# Patient Record
Sex: Female | Born: 1960 | Race: Black or African American | Hispanic: No | Marital: Single | Smoking: Former smoker
Health system: Southern US, Community
[De-identification: ages and names within clinical notes are randomized; demographics above are authoritative.]

## PROBLEM LIST (undated history)

## (undated) DIAGNOSIS — F32A Depression, unspecified: Secondary | ICD-10-CM

## (undated) DIAGNOSIS — J189 Pneumonia, unspecified organism: Secondary | ICD-10-CM

## (undated) DIAGNOSIS — M109 Gout, unspecified: Secondary | ICD-10-CM

## (undated) DIAGNOSIS — R519 Headache, unspecified: Secondary | ICD-10-CM

## (undated) DIAGNOSIS — F329 Major depressive disorder, single episode, unspecified: Secondary | ICD-10-CM

## (undated) DIAGNOSIS — I10 Essential (primary) hypertension: Secondary | ICD-10-CM

## (undated) DIAGNOSIS — J449 Chronic obstructive pulmonary disease, unspecified: Secondary | ICD-10-CM

## (undated) DIAGNOSIS — M1990 Unspecified osteoarthritis, unspecified site: Secondary | ICD-10-CM

## (undated) DIAGNOSIS — Z86718 Personal history of other venous thrombosis and embolism: Secondary | ICD-10-CM

## (undated) DIAGNOSIS — E119 Type 2 diabetes mellitus without complications: Secondary | ICD-10-CM

## (undated) DIAGNOSIS — F419 Anxiety disorder, unspecified: Secondary | ICD-10-CM

## (undated) DIAGNOSIS — Z9289 Personal history of other medical treatment: Secondary | ICD-10-CM

## (undated) DIAGNOSIS — K219 Gastro-esophageal reflux disease without esophagitis: Secondary | ICD-10-CM

## (undated) DIAGNOSIS — R0600 Dyspnea, unspecified: Secondary | ICD-10-CM

## (undated) DIAGNOSIS — J45909 Unspecified asthma, uncomplicated: Secondary | ICD-10-CM

## (undated) DIAGNOSIS — I5032 Chronic diastolic (congestive) heart failure: Secondary | ICD-10-CM

## (undated) DIAGNOSIS — Z808 Family history of malignant neoplasm of other organs or systems: Secondary | ICD-10-CM

## (undated) DIAGNOSIS — C73 Malignant neoplasm of thyroid gland: Secondary | ICD-10-CM

## (undated) DIAGNOSIS — R51 Headache: Secondary | ICD-10-CM

## (undated) HISTORY — DX: Chronic obstructive pulmonary disease, unspecified: J44.9

## (undated) HISTORY — DX: Family history of malignant neoplasm of other organs or systems: Z80.8

## (undated) HISTORY — PX: TONSILLECTOMY: SUR1361

## (undated) HISTORY — DX: Unspecified asthma, uncomplicated: J45.909

## (undated) HISTORY — DX: Gastro-esophageal reflux disease without esophagitis: K21.9

## (undated) HISTORY — PX: BUNIONECTOMY: SHX129

## (undated) HISTORY — DX: Depression, unspecified: F32.A

## (undated) HISTORY — DX: Personal history of other medical treatment: Z92.89

## (undated) HISTORY — DX: Chronic diastolic (congestive) heart failure: I50.32

## (undated) HISTORY — DX: Major depressive disorder, single episode, unspecified: F32.9

---

## 1997-06-29 ENCOUNTER — Ambulatory Visit (HOSPITAL_COMMUNITY): Admission: RE | Admit: 1997-06-29 | Discharge: 1997-06-29 | Payer: Self-pay | Admitting: General Surgery

## 1997-08-03 ENCOUNTER — Ambulatory Visit (HOSPITAL_COMMUNITY): Admission: RE | Admit: 1997-08-03 | Discharge: 1997-08-03 | Payer: Self-pay | Admitting: *Deleted

## 1997-11-05 ENCOUNTER — Emergency Department (HOSPITAL_COMMUNITY): Admission: EM | Admit: 1997-11-05 | Discharge: 1997-11-05 | Payer: Self-pay | Admitting: Emergency Medicine

## 1998-08-27 ENCOUNTER — Emergency Department (HOSPITAL_COMMUNITY): Admission: EM | Admit: 1998-08-27 | Discharge: 1998-08-27 | Payer: Self-pay | Admitting: Emergency Medicine

## 1998-10-08 ENCOUNTER — Emergency Department (HOSPITAL_COMMUNITY): Admission: EM | Admit: 1998-10-08 | Discharge: 1998-10-08 | Payer: Self-pay | Admitting: Endocrinology

## 1998-10-08 ENCOUNTER — Encounter: Payer: Self-pay | Admitting: Endocrinology

## 1999-06-22 ENCOUNTER — Emergency Department (HOSPITAL_COMMUNITY): Admission: EM | Admit: 1999-06-22 | Discharge: 1999-06-22 | Payer: Self-pay | Admitting: Emergency Medicine

## 1999-06-22 ENCOUNTER — Encounter: Payer: Self-pay | Admitting: Emergency Medicine

## 1999-11-02 ENCOUNTER — Emergency Department (HOSPITAL_COMMUNITY): Admission: EM | Admit: 1999-11-02 | Discharge: 1999-11-02 | Payer: Self-pay | Admitting: Emergency Medicine

## 2000-07-12 ENCOUNTER — Emergency Department (HOSPITAL_COMMUNITY): Admission: EM | Admit: 2000-07-12 | Discharge: 2000-07-12 | Payer: Self-pay | Admitting: *Deleted

## 2000-08-17 ENCOUNTER — Emergency Department (HOSPITAL_COMMUNITY): Admission: EM | Admit: 2000-08-17 | Discharge: 2000-08-17 | Payer: Self-pay | Admitting: Internal Medicine

## 2000-09-14 ENCOUNTER — Emergency Department (HOSPITAL_COMMUNITY): Admission: EM | Admit: 2000-09-14 | Discharge: 2000-09-15 | Payer: Self-pay | Admitting: Emergency Medicine

## 2000-09-14 ENCOUNTER — Encounter: Payer: Self-pay | Admitting: Emergency Medicine

## 2000-09-17 ENCOUNTER — Emergency Department (HOSPITAL_COMMUNITY): Admission: EM | Admit: 2000-09-17 | Discharge: 2000-09-17 | Payer: Self-pay | Admitting: Emergency Medicine

## 2000-09-18 ENCOUNTER — Emergency Department (HOSPITAL_COMMUNITY): Admission: EM | Admit: 2000-09-18 | Discharge: 2000-09-18 | Payer: Self-pay | Admitting: Internal Medicine

## 2000-10-29 ENCOUNTER — Emergency Department (HOSPITAL_COMMUNITY): Admission: EM | Admit: 2000-10-29 | Discharge: 2000-10-29 | Payer: Self-pay | Admitting: Emergency Medicine

## 2001-01-17 ENCOUNTER — Emergency Department (HOSPITAL_COMMUNITY): Admission: EM | Admit: 2001-01-17 | Discharge: 2001-01-17 | Payer: Self-pay

## 2001-05-06 ENCOUNTER — Encounter: Payer: Self-pay | Admitting: Emergency Medicine

## 2001-05-06 ENCOUNTER — Emergency Department (HOSPITAL_COMMUNITY): Admission: EM | Admit: 2001-05-06 | Discharge: 2001-05-06 | Payer: Self-pay | Admitting: *Deleted

## 2001-07-17 ENCOUNTER — Encounter: Payer: Self-pay | Admitting: Emergency Medicine

## 2001-07-17 ENCOUNTER — Emergency Department (HOSPITAL_COMMUNITY): Admission: EM | Admit: 2001-07-17 | Discharge: 2001-07-17 | Payer: Self-pay | Admitting: Emergency Medicine

## 2001-07-30 ENCOUNTER — Encounter: Admission: RE | Admit: 2001-07-30 | Discharge: 2001-10-28 | Payer: Self-pay | Admitting: Orthopedic Surgery

## 2001-12-29 ENCOUNTER — Encounter: Payer: Self-pay | Admitting: Emergency Medicine

## 2001-12-29 ENCOUNTER — Emergency Department (HOSPITAL_COMMUNITY): Admission: EM | Admit: 2001-12-29 | Discharge: 2001-12-29 | Payer: Self-pay | Admitting: Emergency Medicine

## 2002-12-03 ENCOUNTER — Encounter: Payer: Self-pay | Admitting: Emergency Medicine

## 2002-12-03 ENCOUNTER — Emergency Department (HOSPITAL_COMMUNITY): Admission: EM | Admit: 2002-12-03 | Discharge: 2002-12-03 | Payer: Self-pay | Admitting: Emergency Medicine

## 2002-12-20 ENCOUNTER — Emergency Department (HOSPITAL_COMMUNITY): Admission: EM | Admit: 2002-12-20 | Discharge: 2002-12-21 | Payer: Self-pay | Admitting: Emergency Medicine

## 2002-12-20 ENCOUNTER — Encounter: Payer: Self-pay | Admitting: Emergency Medicine

## 2002-12-22 ENCOUNTER — Encounter (INDEPENDENT_AMBULATORY_CARE_PROVIDER_SITE_OTHER): Payer: Self-pay | Admitting: *Deleted

## 2002-12-22 ENCOUNTER — Ambulatory Visit (HOSPITAL_BASED_OUTPATIENT_CLINIC_OR_DEPARTMENT_OTHER): Admission: RE | Admit: 2002-12-22 | Discharge: 2002-12-22 | Payer: Self-pay | Admitting: Specialist

## 2003-01-15 ENCOUNTER — Encounter: Payer: Self-pay | Admitting: Emergency Medicine

## 2003-01-15 ENCOUNTER — Emergency Department (HOSPITAL_COMMUNITY): Admission: EM | Admit: 2003-01-15 | Discharge: 2003-01-15 | Payer: Self-pay | Admitting: Emergency Medicine

## 2003-01-20 ENCOUNTER — Emergency Department (HOSPITAL_COMMUNITY): Admission: AD | Admit: 2003-01-20 | Discharge: 2003-01-20 | Payer: Self-pay | Admitting: Family Medicine

## 2003-04-01 ENCOUNTER — Emergency Department (HOSPITAL_COMMUNITY): Admission: EM | Admit: 2003-04-01 | Discharge: 2003-04-02 | Payer: Self-pay | Admitting: Emergency Medicine

## 2003-07-05 ENCOUNTER — Emergency Department (HOSPITAL_COMMUNITY): Admission: EM | Admit: 2003-07-05 | Discharge: 2003-07-05 | Payer: Self-pay | Admitting: Emergency Medicine

## 2003-07-07 ENCOUNTER — Emergency Department (HOSPITAL_COMMUNITY): Admission: EM | Admit: 2003-07-07 | Discharge: 2003-07-07 | Payer: Self-pay | Admitting: Emergency Medicine

## 2003-10-04 ENCOUNTER — Emergency Department (HOSPITAL_COMMUNITY): Admission: EM | Admit: 2003-10-04 | Discharge: 2003-10-05 | Payer: Self-pay | Admitting: Emergency Medicine

## 2003-10-19 ENCOUNTER — Emergency Department (HOSPITAL_COMMUNITY): Admission: EM | Admit: 2003-10-19 | Discharge: 2003-10-19 | Payer: Self-pay | Admitting: *Deleted

## 2003-10-20 ENCOUNTER — Emergency Department (HOSPITAL_COMMUNITY): Admission: EM | Admit: 2003-10-20 | Discharge: 2003-10-20 | Payer: Self-pay | Admitting: Family Medicine

## 2004-01-16 ENCOUNTER — Emergency Department (HOSPITAL_COMMUNITY): Admission: EM | Admit: 2004-01-16 | Discharge: 2004-01-17 | Payer: Self-pay | Admitting: Emergency Medicine

## 2004-01-19 ENCOUNTER — Emergency Department (HOSPITAL_COMMUNITY): Admission: EM | Admit: 2004-01-19 | Discharge: 2004-01-19 | Payer: Self-pay | Admitting: Emergency Medicine

## 2004-04-11 ENCOUNTER — Emergency Department (HOSPITAL_COMMUNITY): Admission: EM | Admit: 2004-04-11 | Discharge: 2004-04-11 | Payer: Self-pay | Admitting: Emergency Medicine

## 2004-12-06 ENCOUNTER — Emergency Department (HOSPITAL_COMMUNITY): Admission: EM | Admit: 2004-12-06 | Discharge: 2004-12-06 | Payer: Self-pay | Admitting: Emergency Medicine

## 2005-02-22 ENCOUNTER — Emergency Department (HOSPITAL_COMMUNITY): Admission: EM | Admit: 2005-02-22 | Discharge: 2005-02-22 | Payer: Self-pay | Admitting: Emergency Medicine

## 2005-03-27 ENCOUNTER — Encounter: Admission: RE | Admit: 2005-03-27 | Discharge: 2005-03-27 | Payer: Self-pay | Admitting: Internal Medicine

## 2005-06-02 ENCOUNTER — Emergency Department (HOSPITAL_COMMUNITY): Admission: EM | Admit: 2005-06-02 | Discharge: 2005-06-02 | Payer: Self-pay | Admitting: Emergency Medicine

## 2005-06-05 ENCOUNTER — Emergency Department (HOSPITAL_COMMUNITY): Admission: EM | Admit: 2005-06-05 | Discharge: 2005-06-05 | Payer: Self-pay | Admitting: Emergency Medicine

## 2005-10-06 ENCOUNTER — Emergency Department (HOSPITAL_COMMUNITY): Admission: EM | Admit: 2005-10-06 | Discharge: 2005-10-06 | Payer: Self-pay | Admitting: Emergency Medicine

## 2005-12-24 ENCOUNTER — Emergency Department (HOSPITAL_COMMUNITY): Admission: EM | Admit: 2005-12-24 | Discharge: 2005-12-24 | Payer: Self-pay | Admitting: Emergency Medicine

## 2005-12-27 ENCOUNTER — Emergency Department (HOSPITAL_COMMUNITY): Admission: EM | Admit: 2005-12-27 | Discharge: 2005-12-27 | Payer: Self-pay | Admitting: *Deleted

## 2006-03-10 IMAGING — CR DG CHEST 2V
2 series · 2 of 2 positions shown · non-contrast
Comparison: none

CLINICAL DATA: Cough, congestion. 

 TWO VIEW CHEST 
 The heart is slightly enlarged. The lungs are clear with normal vascularity.  
 IMPRESSION
 Mild cardiomegaly with no active lung disease. 
 [REDACTED]

[view not recorded (1 of 2)]
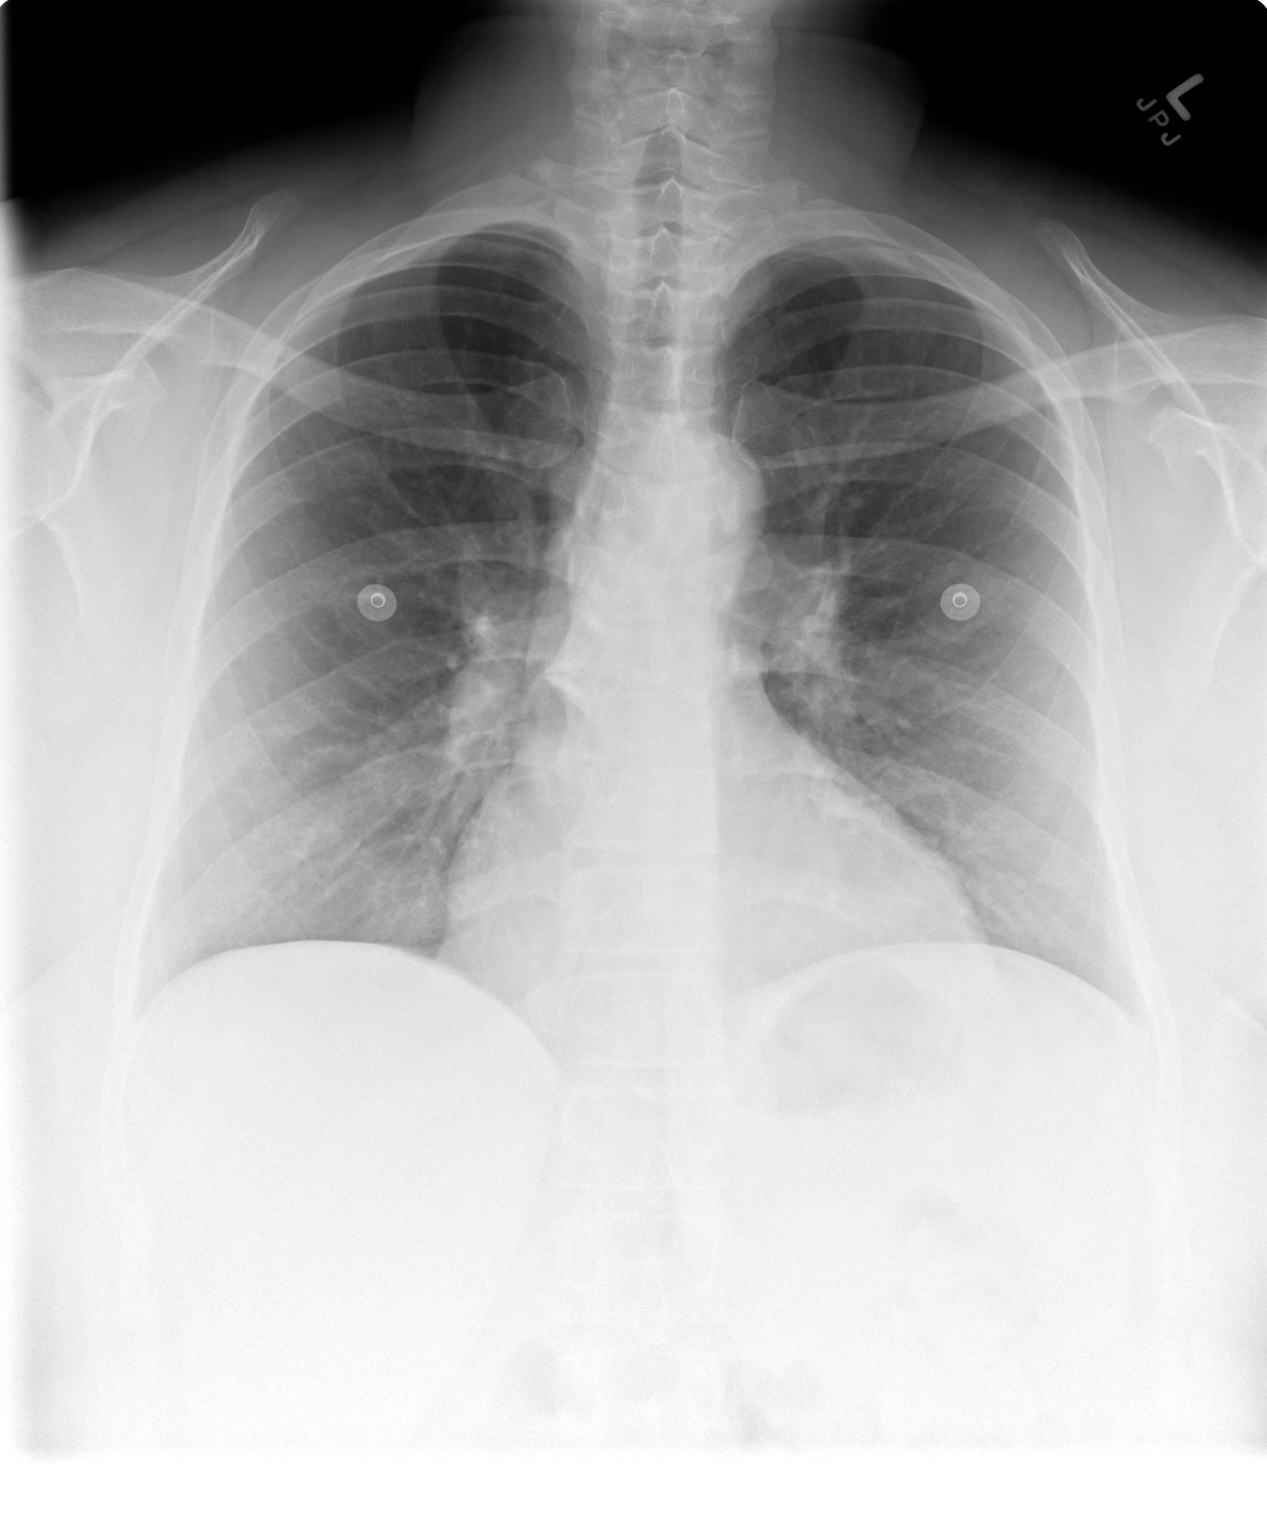

[view not recorded (2 of 2)]
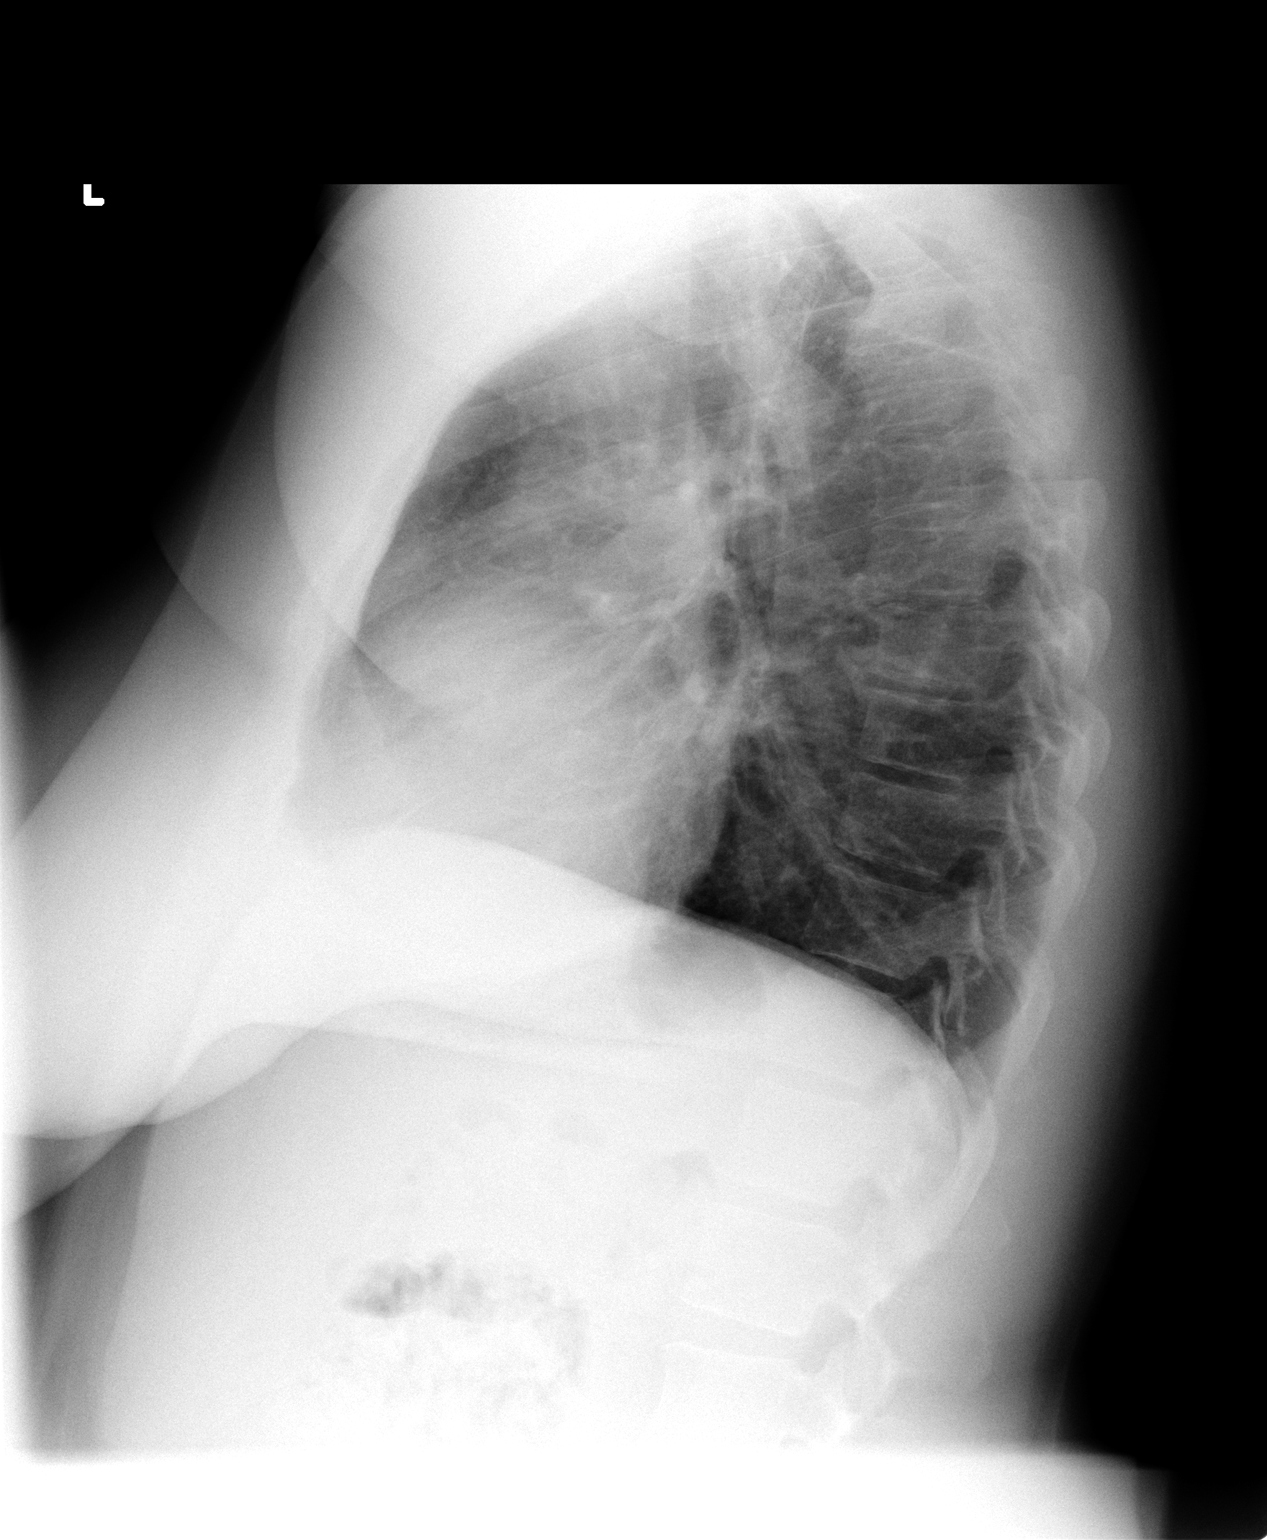

[2 of 2 positions shown; findings below may reference images not displayed]

## 2006-03-12 IMAGING — CR DG CHEST 2V
2 series · 2 of 2 positions shown · non-contrast
Comparison: 07/05/03.

CLINICAL DATA: Cough, shortness of breath, respiratory distress.
 CHEST, TWO VIEWS 07/07/03

[view not recorded (1 of 2)]
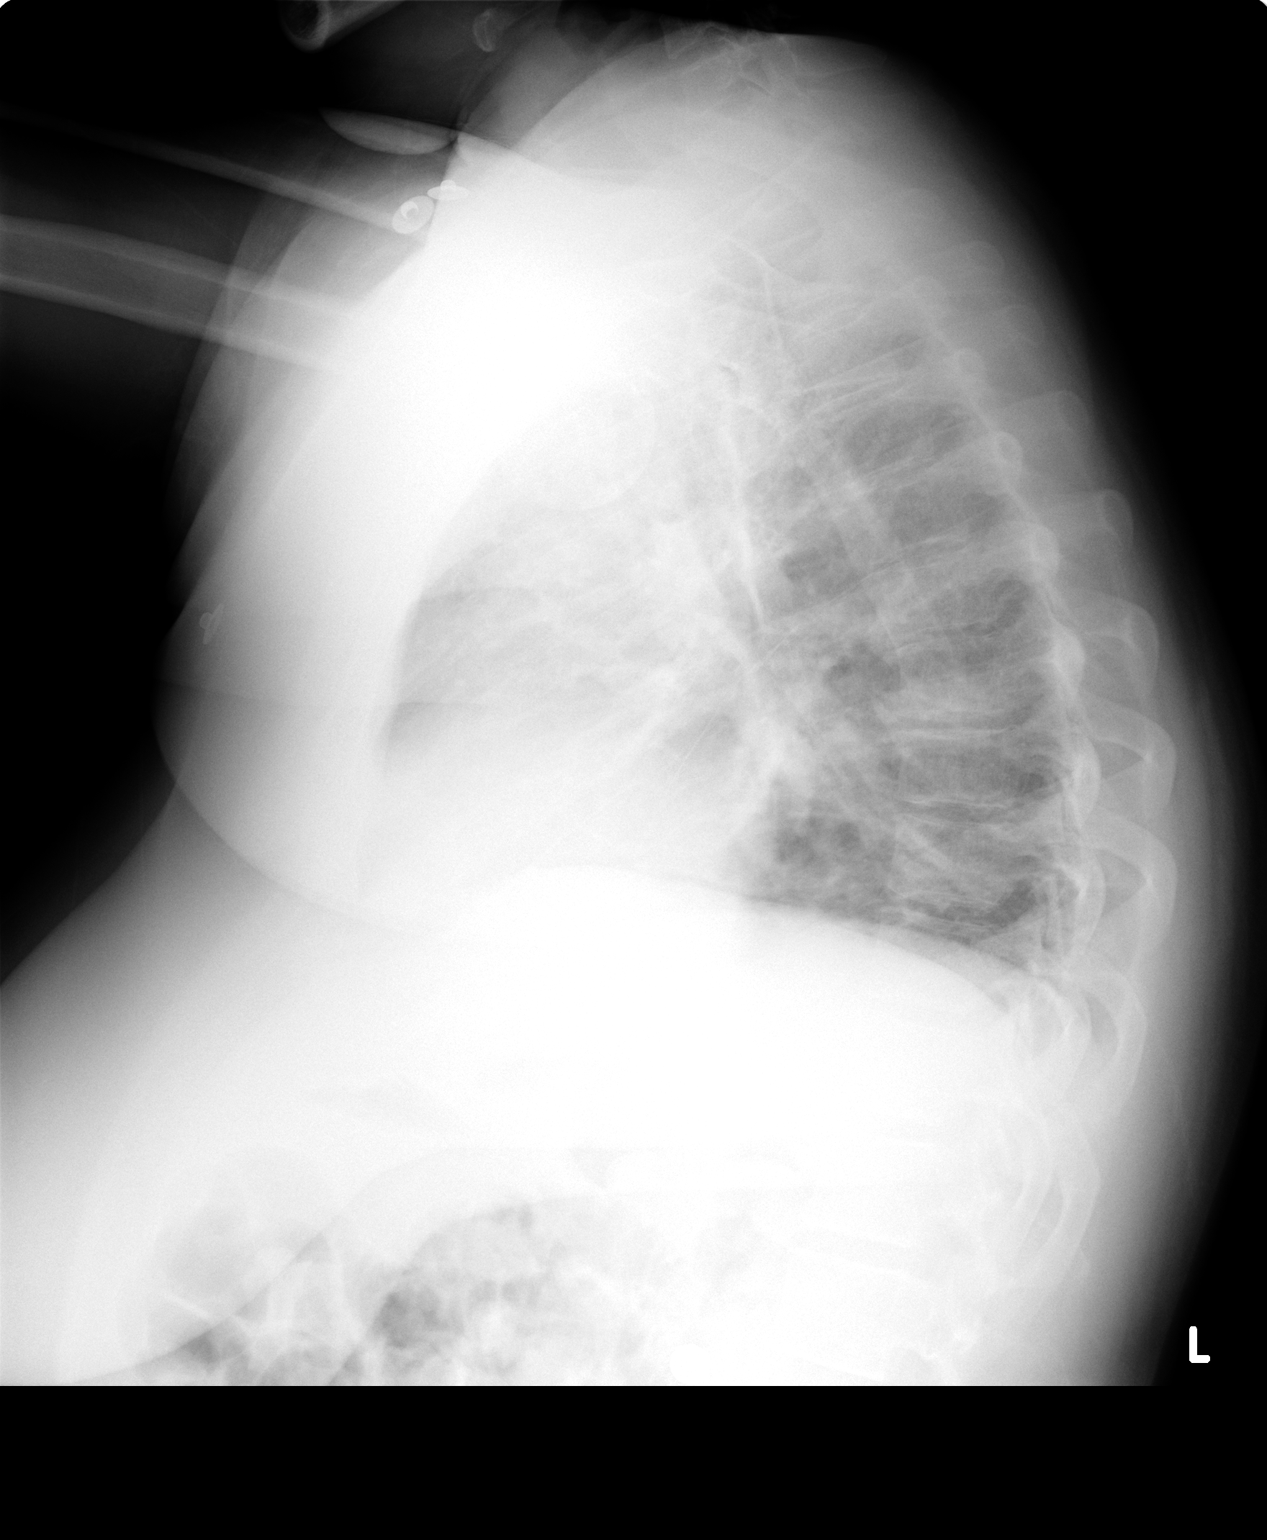

[view not recorded (2 of 2)]
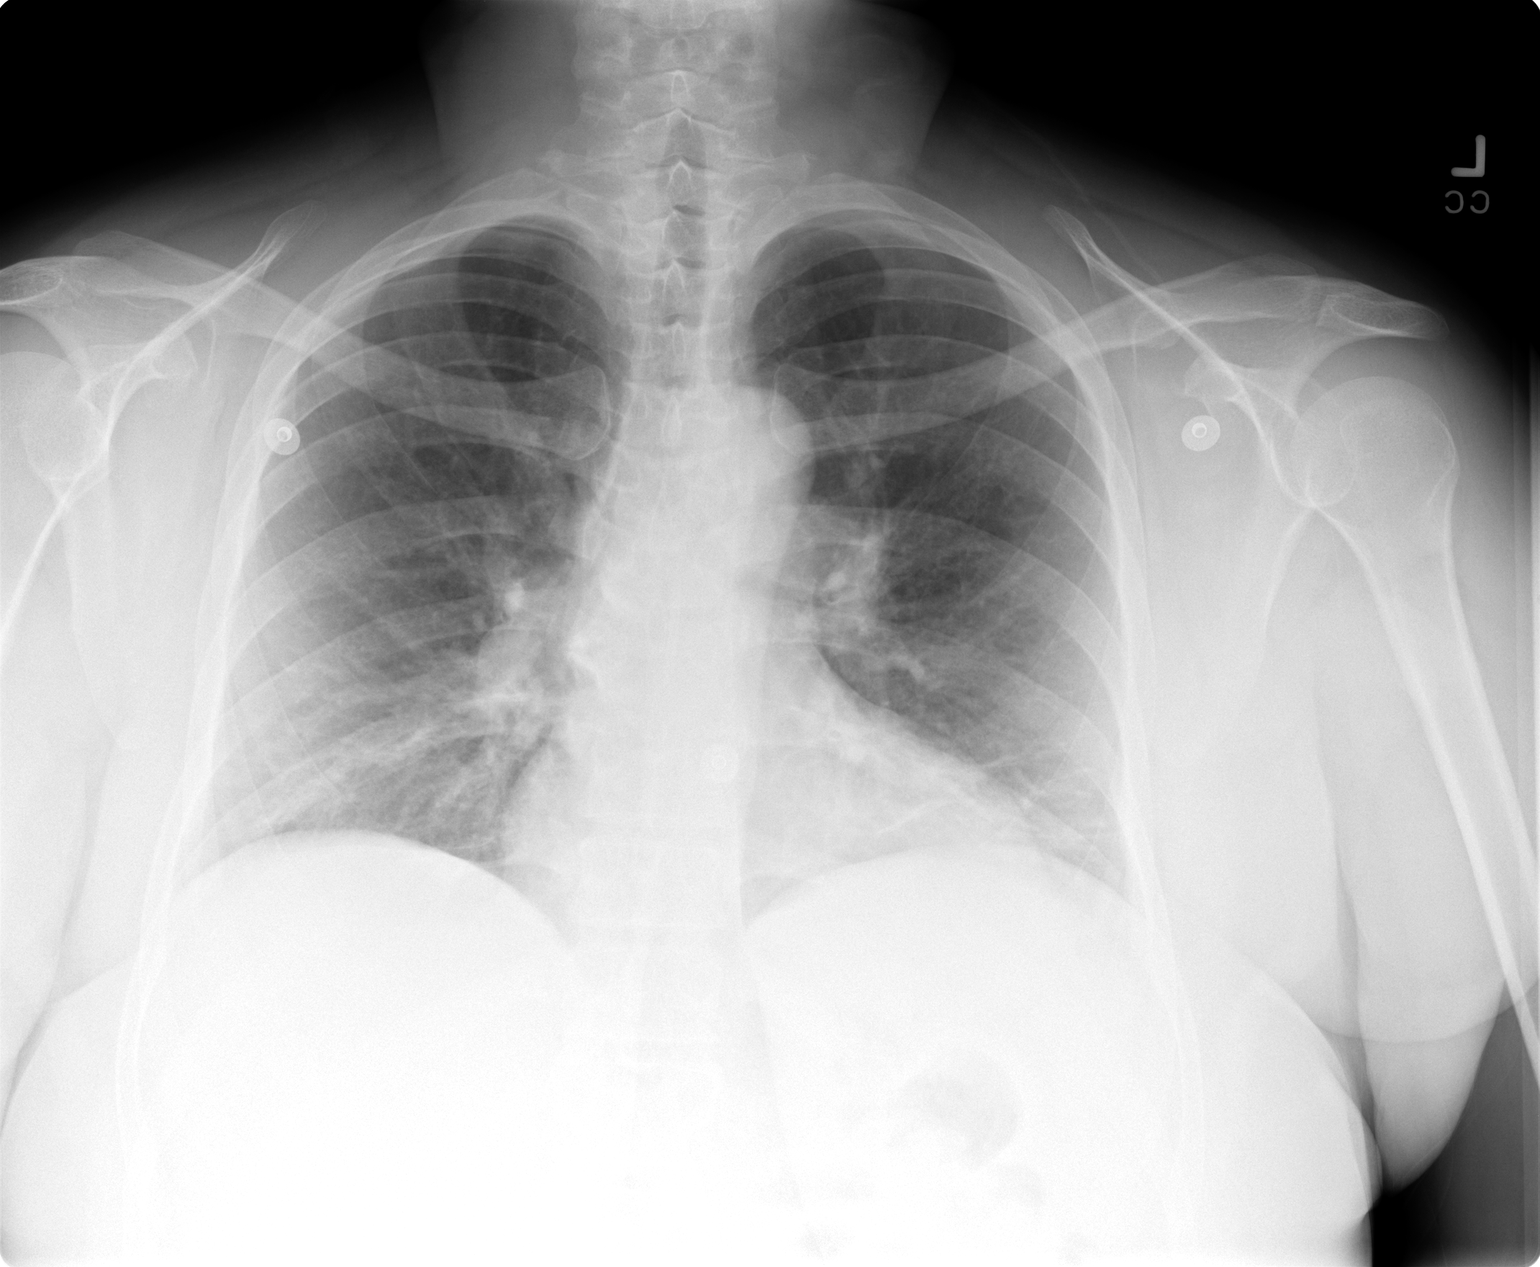

[2 of 2 positions shown; findings below may reference images not displayed]

FINDINGS: Lower lung volumes are present on today?s exam.  There are linear opacities at both lung bases suggesting subsegmental atelectasis.  There is vascular crowding due to the low lung volumes.  Mild airway thickening is present raising the possibility of bronchitis.  
 IMPRESSION
 1.  Mild airway thickening possibly representing bronchitis.
 2.  Subsegmental atelectasis left lung base. 
 3.  Crowding of the pulmonary vasculature due to low lung volumes.

## 2006-05-06 ENCOUNTER — Emergency Department (HOSPITAL_COMMUNITY): Admission: EM | Admit: 2006-05-06 | Discharge: 2006-05-06 | Payer: Self-pay | Admitting: Family Medicine

## 2006-06-05 ENCOUNTER — Emergency Department (HOSPITAL_COMMUNITY): Admission: EM | Admit: 2006-06-05 | Discharge: 2006-06-05 | Payer: Self-pay | Admitting: Emergency Medicine

## 2006-07-02 ENCOUNTER — Emergency Department (HOSPITAL_COMMUNITY): Admission: EM | Admit: 2006-07-02 | Discharge: 2006-07-02 | Payer: Self-pay | Admitting: Family Medicine

## 2006-09-21 IMAGING — CR DG CHEST 2V
2 series · 2 of 2 positions shown · non-contrast
Comparison: none

CLINICAL DATA: 42-year-old with cough and chest pain. 
 CHEST (TWO VIEWS)
 Two views of the chest are compared to prior film from 07/07/03.  The cardiac silhouette, mediastinal and hilar contours are stable.  There is stable streaky bibasilar scarring changes.  No definite acute overlying pulmonary process.  No effusion.  The bony structures are intact. 
 IMPRESSION
 1.  Bibasilar scarring changes. 
 2.  No acute pulmonary finding.

[view not recorded (1 of 2)]
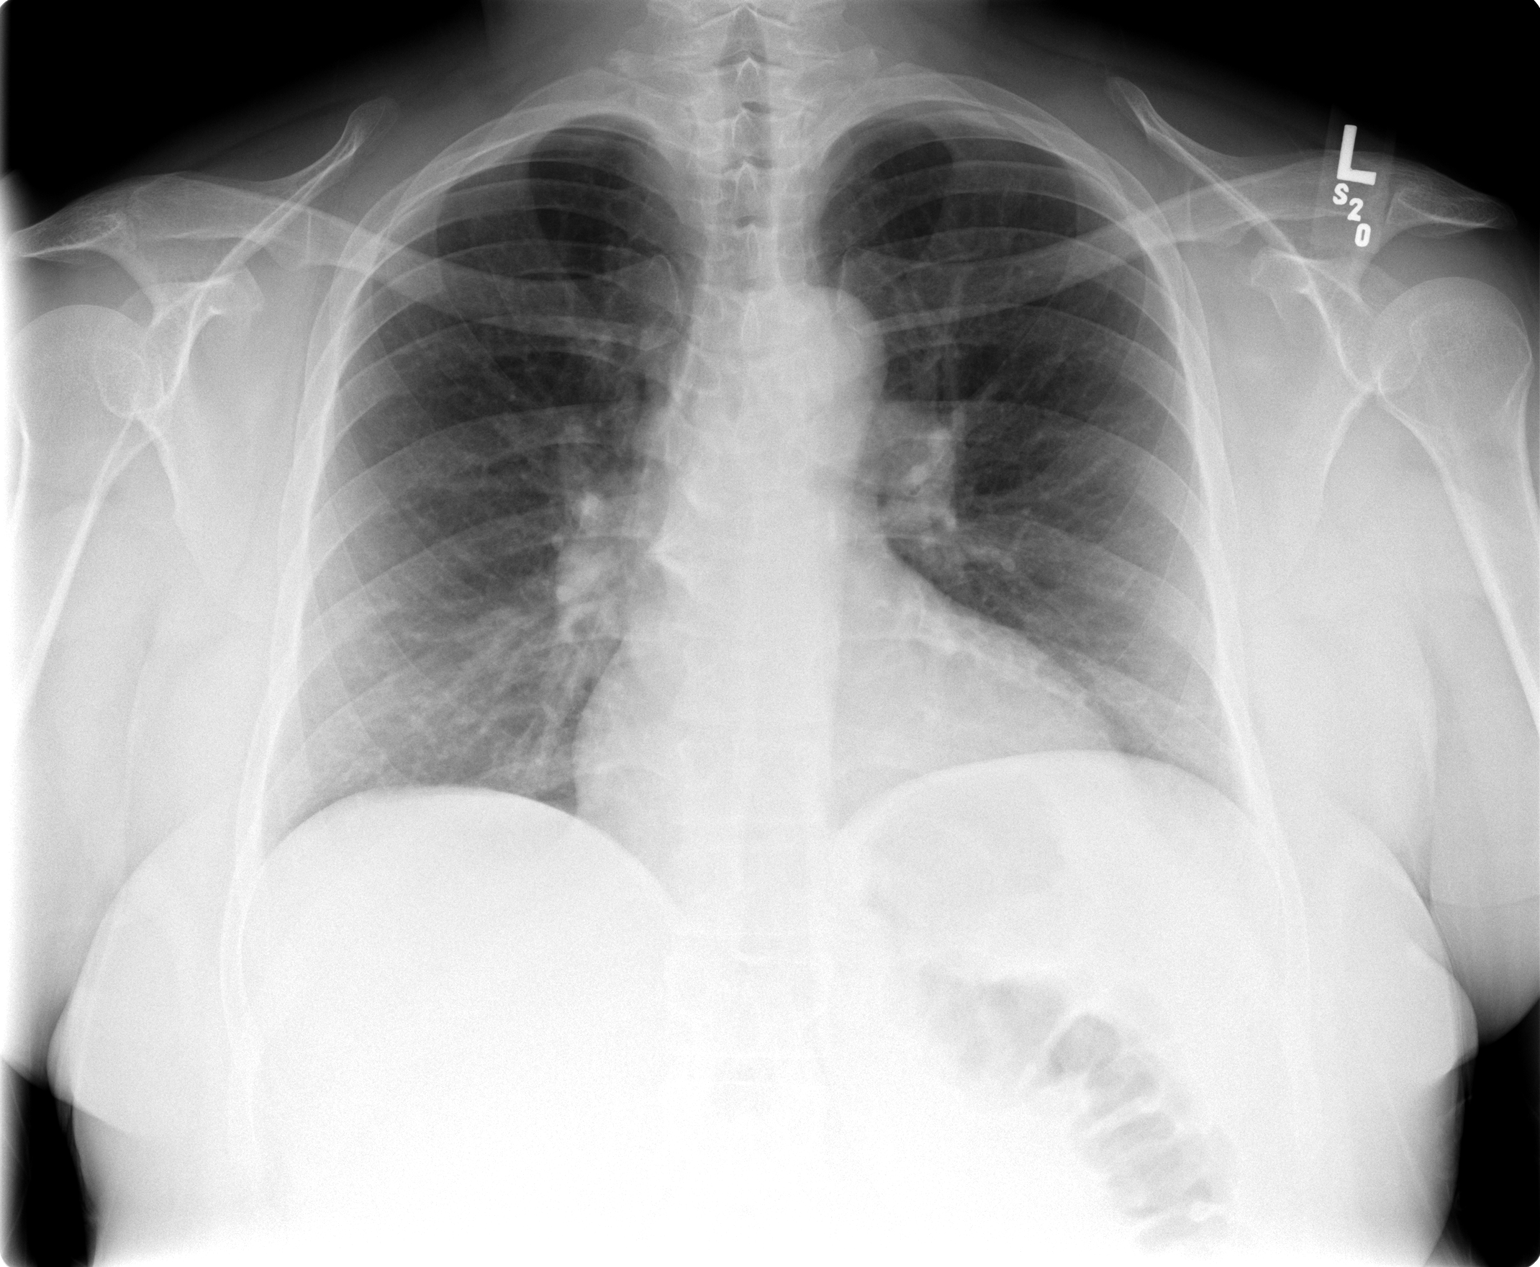

[view not recorded (2 of 2)]
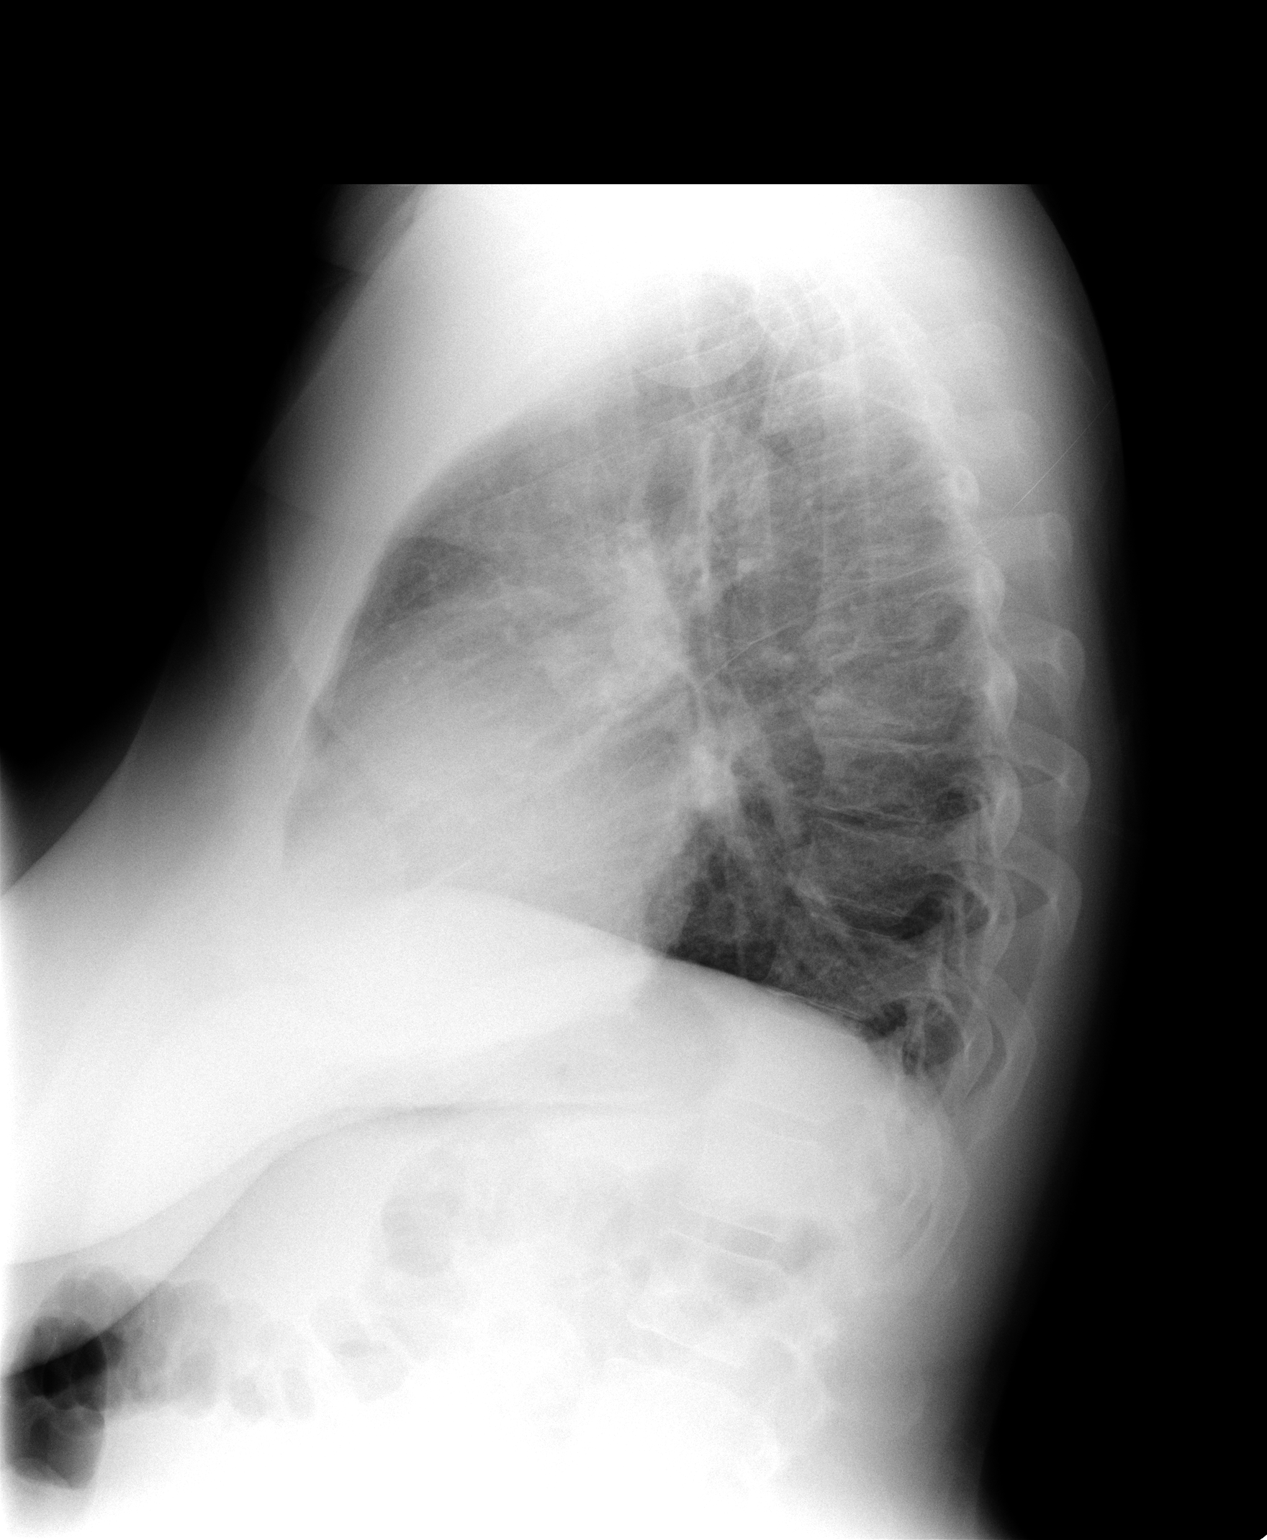

[2 of 2 positions shown; findings below may reference images not displayed]

## 2007-01-07 ENCOUNTER — Emergency Department (HOSPITAL_COMMUNITY): Admission: EM | Admit: 2007-01-07 | Discharge: 2007-01-07 | Payer: Self-pay | Admitting: Emergency Medicine

## 2007-01-31 ENCOUNTER — Emergency Department (HOSPITAL_COMMUNITY): Admission: EM | Admit: 2007-01-31 | Discharge: 2007-01-31 | Payer: Self-pay | Admitting: Family Medicine

## 2007-02-20 ENCOUNTER — Emergency Department (HOSPITAL_COMMUNITY): Admission: EM | Admit: 2007-02-20 | Discharge: 2007-02-20 | Payer: Self-pay | Admitting: Emergency Medicine

## 2007-05-02 ENCOUNTER — Emergency Department (HOSPITAL_COMMUNITY): Admission: EM | Admit: 2007-05-02 | Discharge: 2007-05-02 | Payer: Self-pay | Admitting: Emergency Medicine

## 2007-08-12 IMAGING — CR DG CHEST 2V
2 series · 2 of 2 positions shown · non-contrast
Comparison: None

CLINICAL DATA: Chest pain, shortness of breath

CHEST - 2 VIEW:

[w chest pa *]
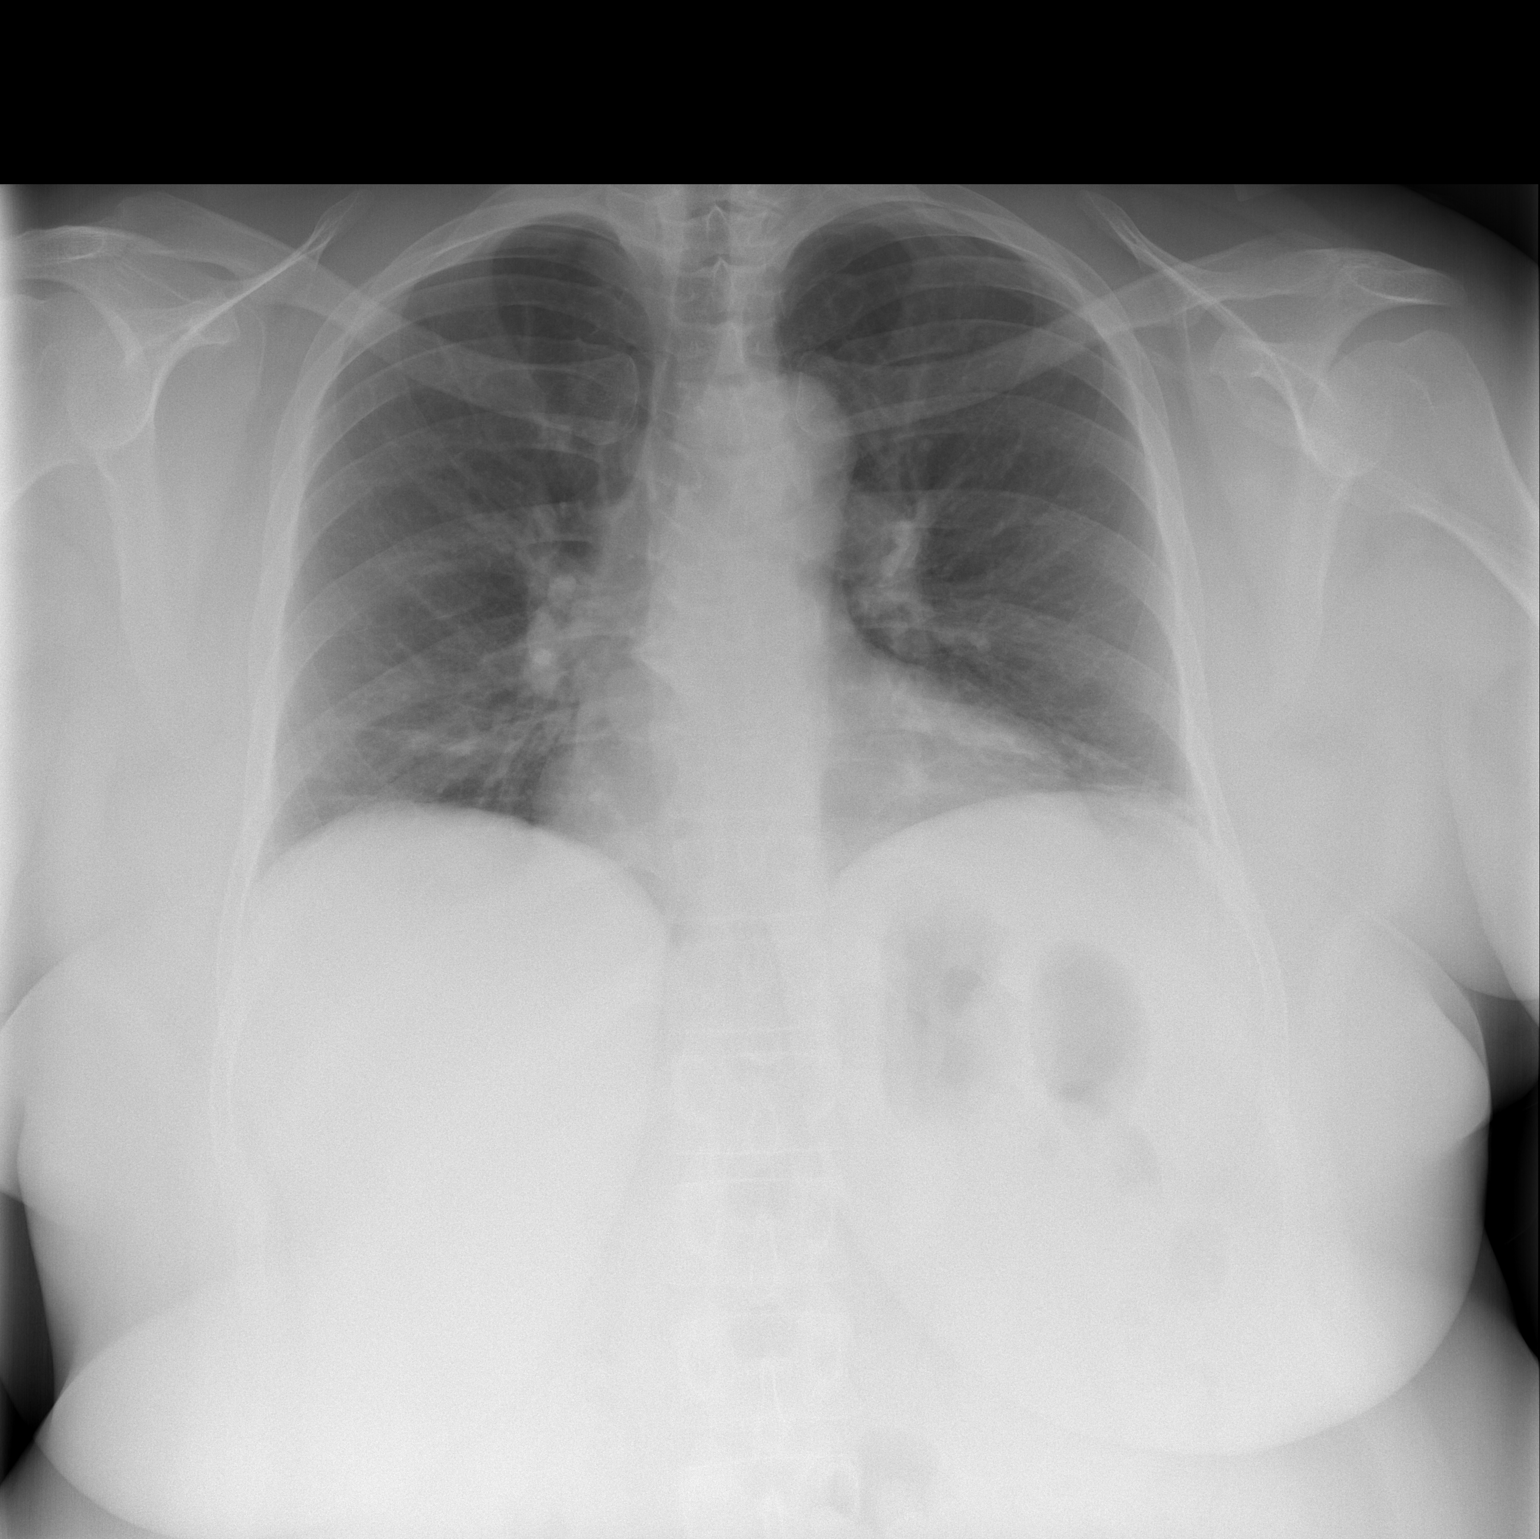

[w chest lat *]
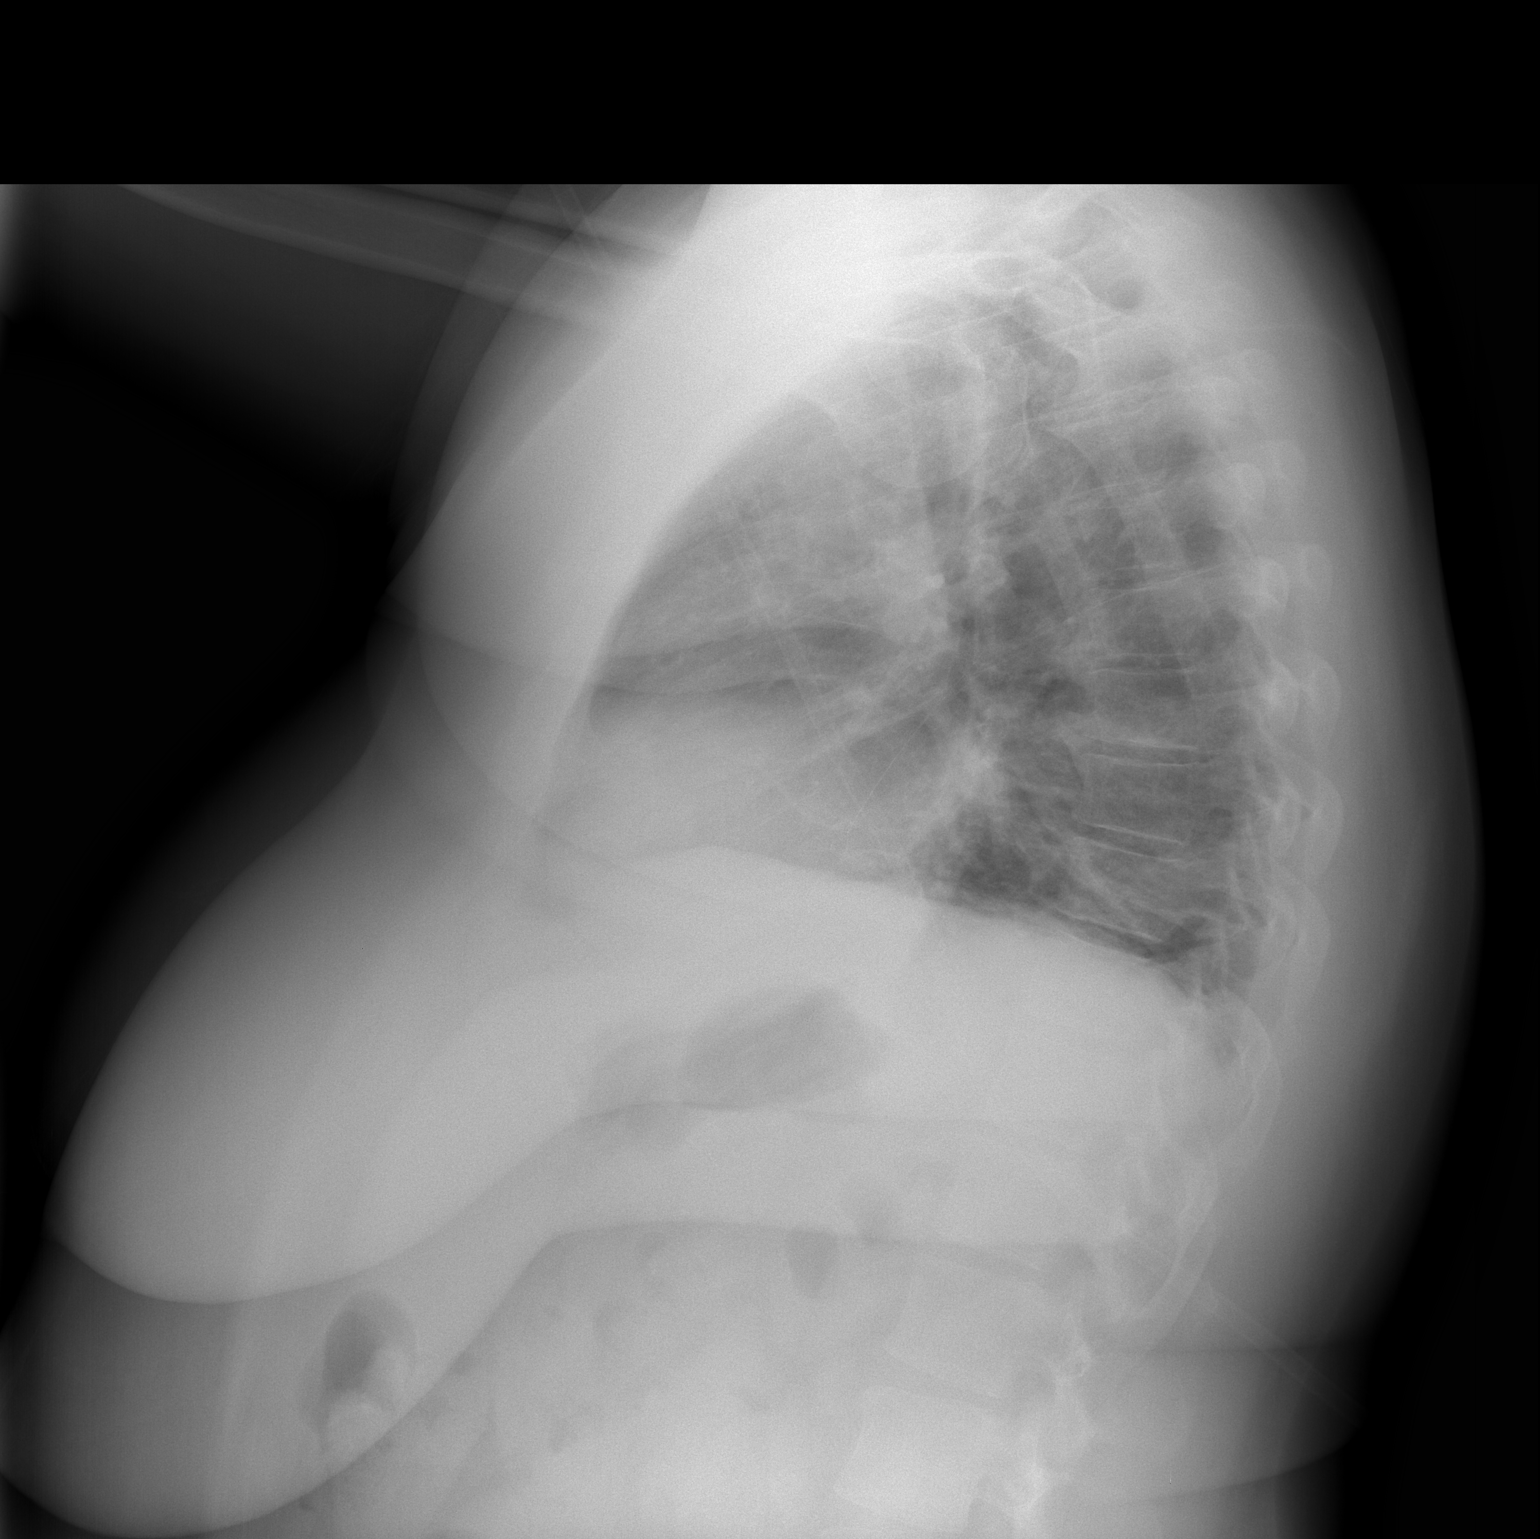

[2 of 2 positions shown; findings below may reference images not displayed]

FINDINGS: There are low lung volumes with bibasilar atelectasis. Heart is
borderline in size. No effusions. Mild degenerative changes in the thoracic
spine.
IMPRESSION: Bibasilar atelectasis. Borderline heart size.

## 2007-09-26 ENCOUNTER — Emergency Department (HOSPITAL_COMMUNITY): Admission: EM | Admit: 2007-09-26 | Discharge: 2007-09-26 | Payer: Self-pay | Admitting: Emergency Medicine

## 2007-10-01 ENCOUNTER — Emergency Department (HOSPITAL_COMMUNITY): Admission: EM | Admit: 2007-10-01 | Discharge: 2007-10-01 | Payer: Self-pay | Admitting: Emergency Medicine

## 2007-10-07 ENCOUNTER — Emergency Department (HOSPITAL_COMMUNITY): Admission: EM | Admit: 2007-10-07 | Discharge: 2007-10-08 | Payer: Self-pay | Admitting: Emergency Medicine

## 2007-10-21 ENCOUNTER — Emergency Department (HOSPITAL_COMMUNITY): Admission: EM | Admit: 2007-10-21 | Discharge: 2007-10-21 | Payer: Self-pay | Admitting: Emergency Medicine

## 2007-10-29 IMAGING — CT CT PELVIS W/O CM
2 of 3 series · 17 of 46 positions shown, 19 images · IV contrast (agent unspecified)
Comparison: None.

CLINICAL DATA: Left flank pain.  Question kidney stone. 
 ABDOMEN CT WITHOUT CONTRAST:
TECHNIQUE: Multidetector CT imaging of the abdomen was performed following the standard protocol without IV contrast.
TECHNIQUE: Multidetector CT imaging of the pelvis was performed following the standard protocol without IV contrast.

[Series 2: stone_wo 5.0 b40f st · axial · 0.92mm/px · z∈[-406,-82]mm · 14 of 95 slices shown, 16 images]
[im 7/95  soft-tissue]
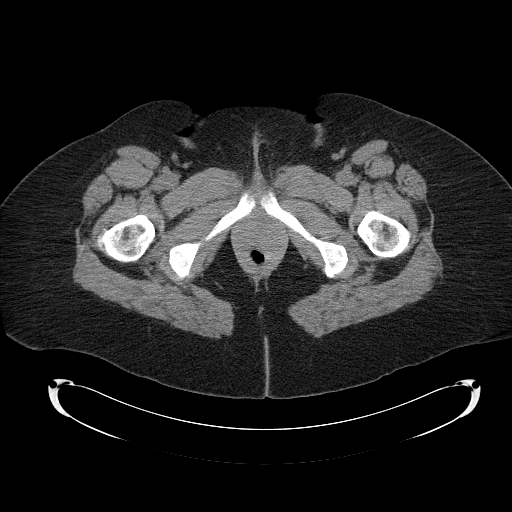
[im 7/95  bone]
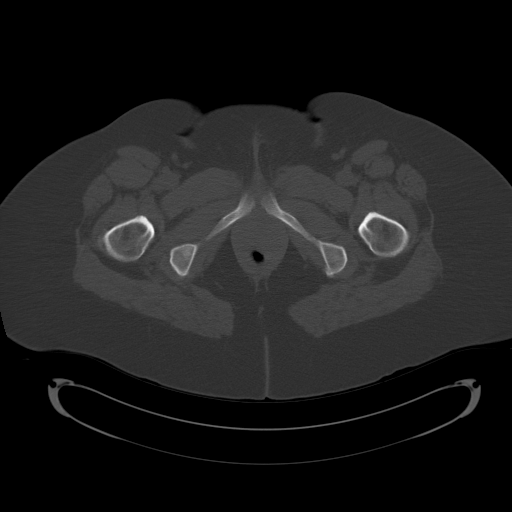
[im 13/95  soft-tissue]
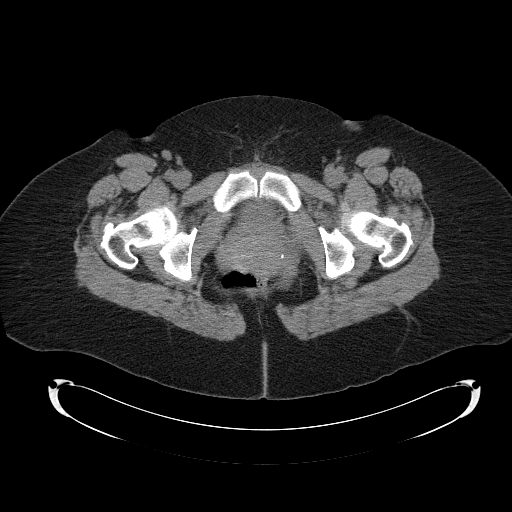
[im 19/95  soft-tissue]
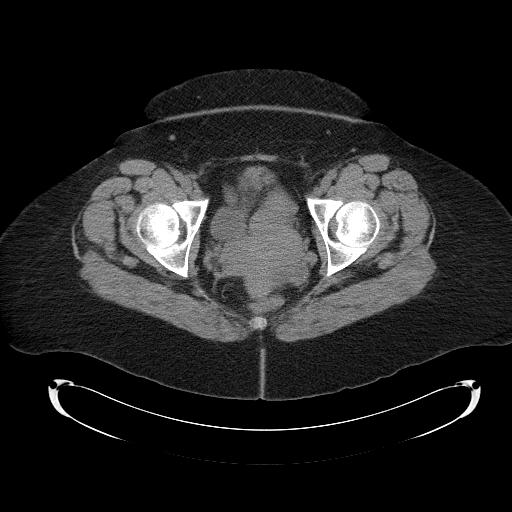
[im 25/95  soft-tissue]
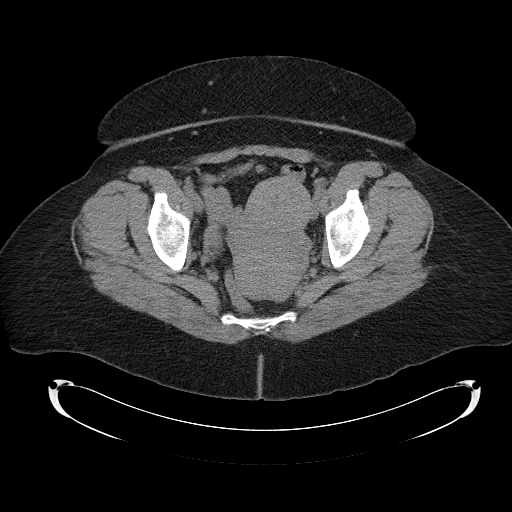
[im 31/95  soft-tissue]
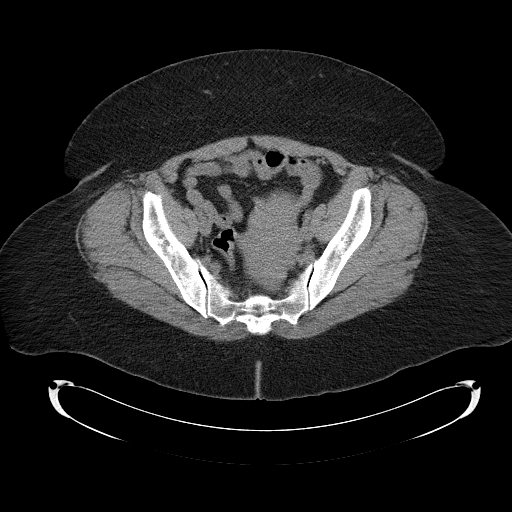
[im 37/95  soft-tissue]
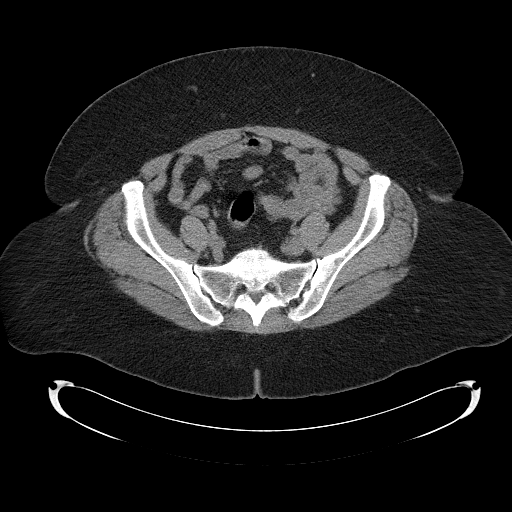
[im 43/95  soft-tissue]
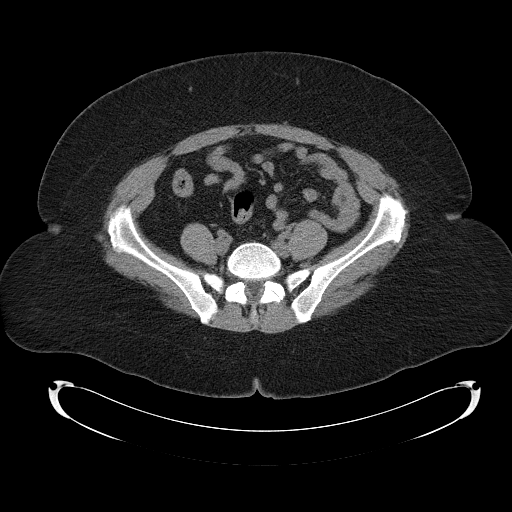
[im 52/95  soft-tissue]
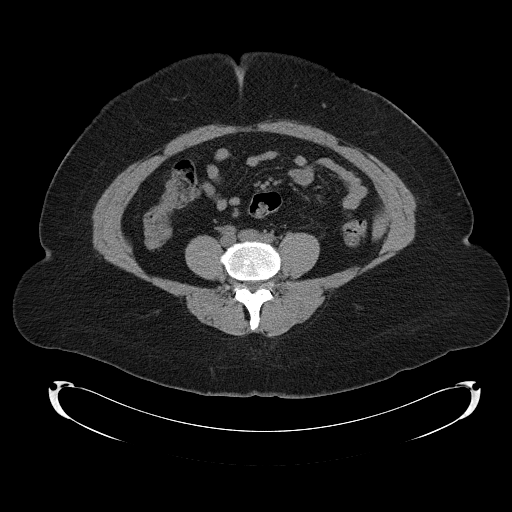
[im 58/95  soft-tissue]
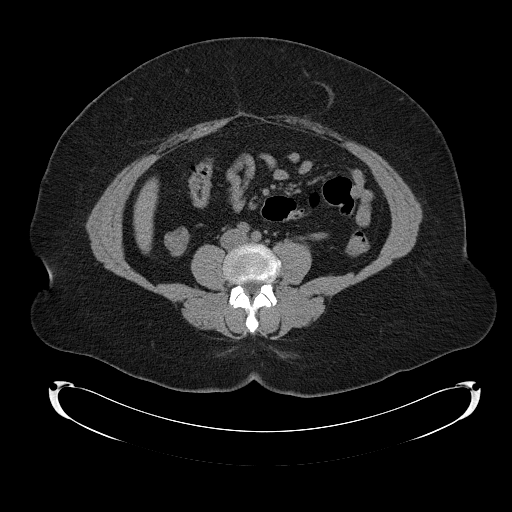
[im 58/95  bone]
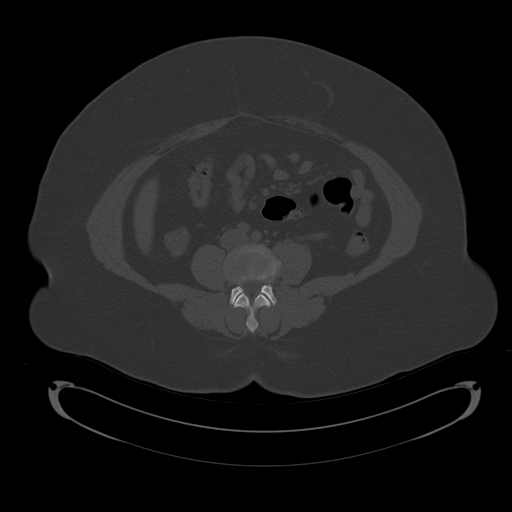
[im 64/95  soft-tissue]
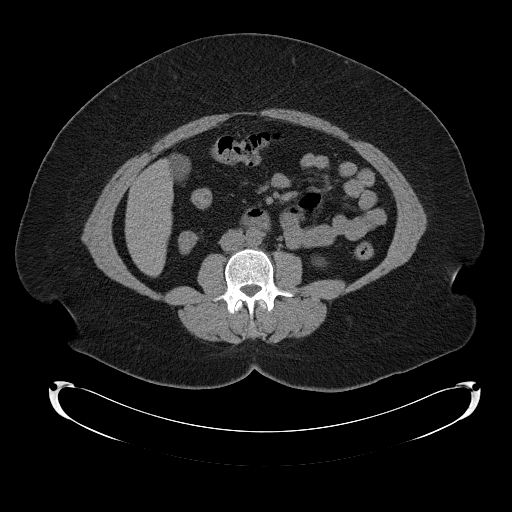
[im 70/95  soft-tissue]
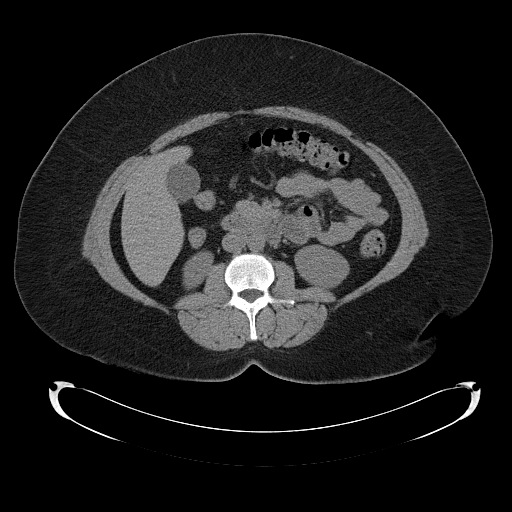
[im 76/95  soft-tissue]
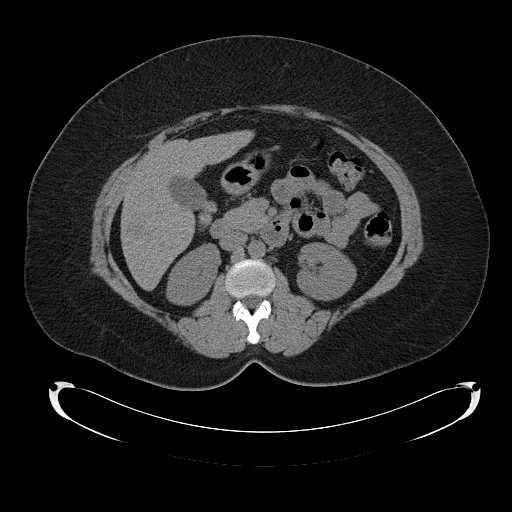
[im 82/95  soft-tissue]
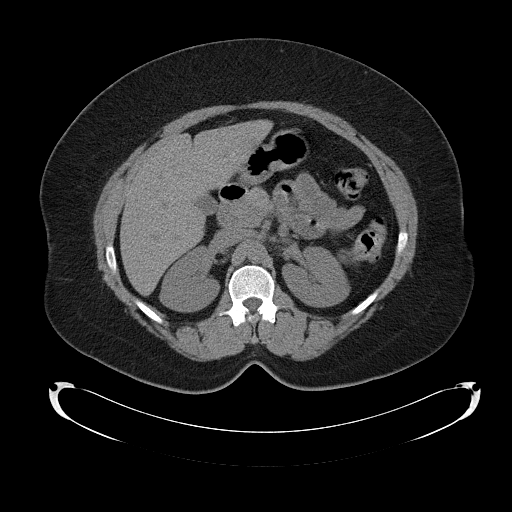
[im 88/95  soft-tissue]
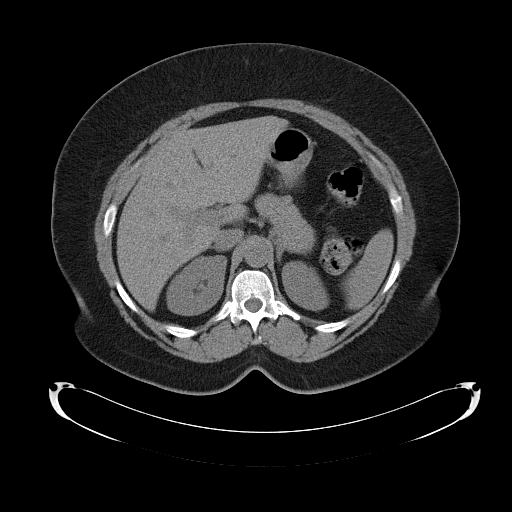

[Series 602: coronal · coronal · 0.92mm/px · 3 of 39 slices shown]
[im 13/39  soft-tissue]
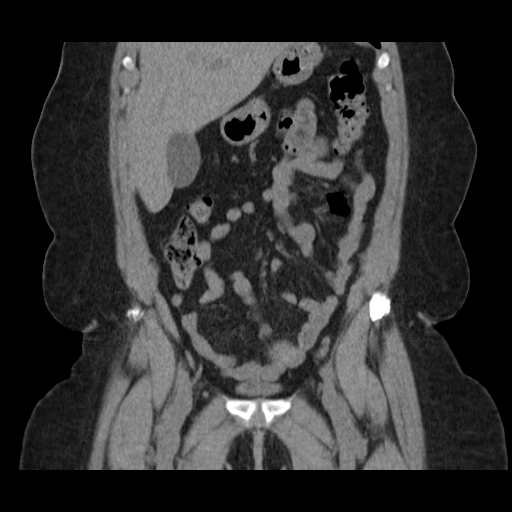
[im 17/39  soft-tissue]
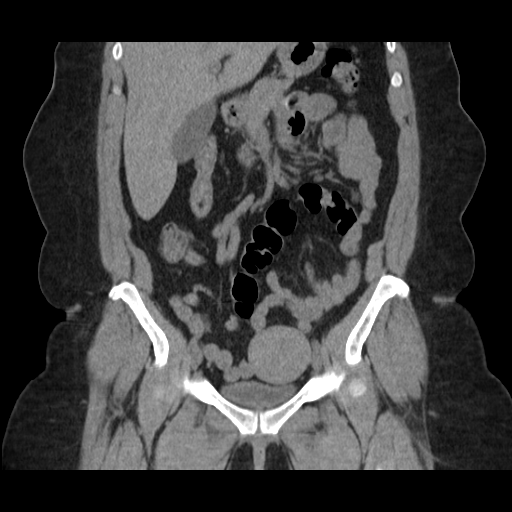
[im 22/39  soft-tissue]
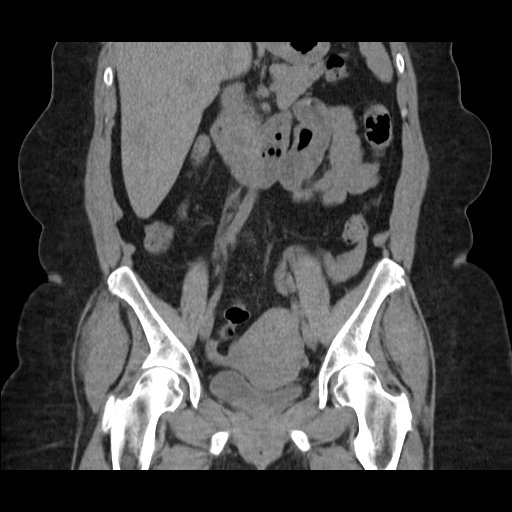

[17 of 46 positions shown; findings below may reference images not displayed]

FINDINGS: 2.1 cm very subtle low density lesion is identified in the inferior right liver.  Neither the liver or the spleen have been imaged in their entirety and a lack of intravenous contrast lessens sensitivity for evaluating the solid abdominal viscera.  Pancreas and visualized portions of the adrenal glands are unremarkable.  There may be a 1 cm stone in the lumen of the gallbladder.  The kidneys have normal imaging features bilaterally.  Specifically there is no evidence for kidney stones, hydronephrosis, or secondary changes in either kidney.  No free fluid or lymphadenopathy.
IMPRESSION: 1.  Ill defined low density lesion in the inferior right liver.  A dedicated liver CT or MRI is recommended to further evaluate. 
 2.  Question cholelithiasis. 
 3.  No evidence for renal stones.  No secondary changes are seen in either kidney.  
 PELVIS CT WITHOUT CONTRAST:
FINDINGS: No evidence for stones in the distal course of either ureter or within the urinary bladder.  Uterus is enlarged and has a lobular contour suggesting fibroid change.  No discrete adnexal mass is apparent although the adnexal regions are not well assessed on the uninfused CT.
IMPRESSION: 1.  No evidence for distal urinary stones. 
 2.  Presumed fibroid change in the uterus although this is difficult to assess on an uninfused CT scan.  Pelvic ultrasound would be helpful to confirm.

## 2007-11-29 ENCOUNTER — Ambulatory Visit (HOSPITAL_COMMUNITY): Admission: RE | Admit: 2007-11-29 | Discharge: 2007-11-29 | Payer: Self-pay | Admitting: Internal Medicine

## 2007-12-01 IMAGING — MG MM MAMMO SCREENING
4 series · 4 of 4 positions shown · non-contrast
Comparison: none

SCREENING MAMMOGRAM:

The breast tissue is nearly entirely fatty.  There is no dominant mass, architectural distortion or
calcification to suggest malignancy.

[R CC]
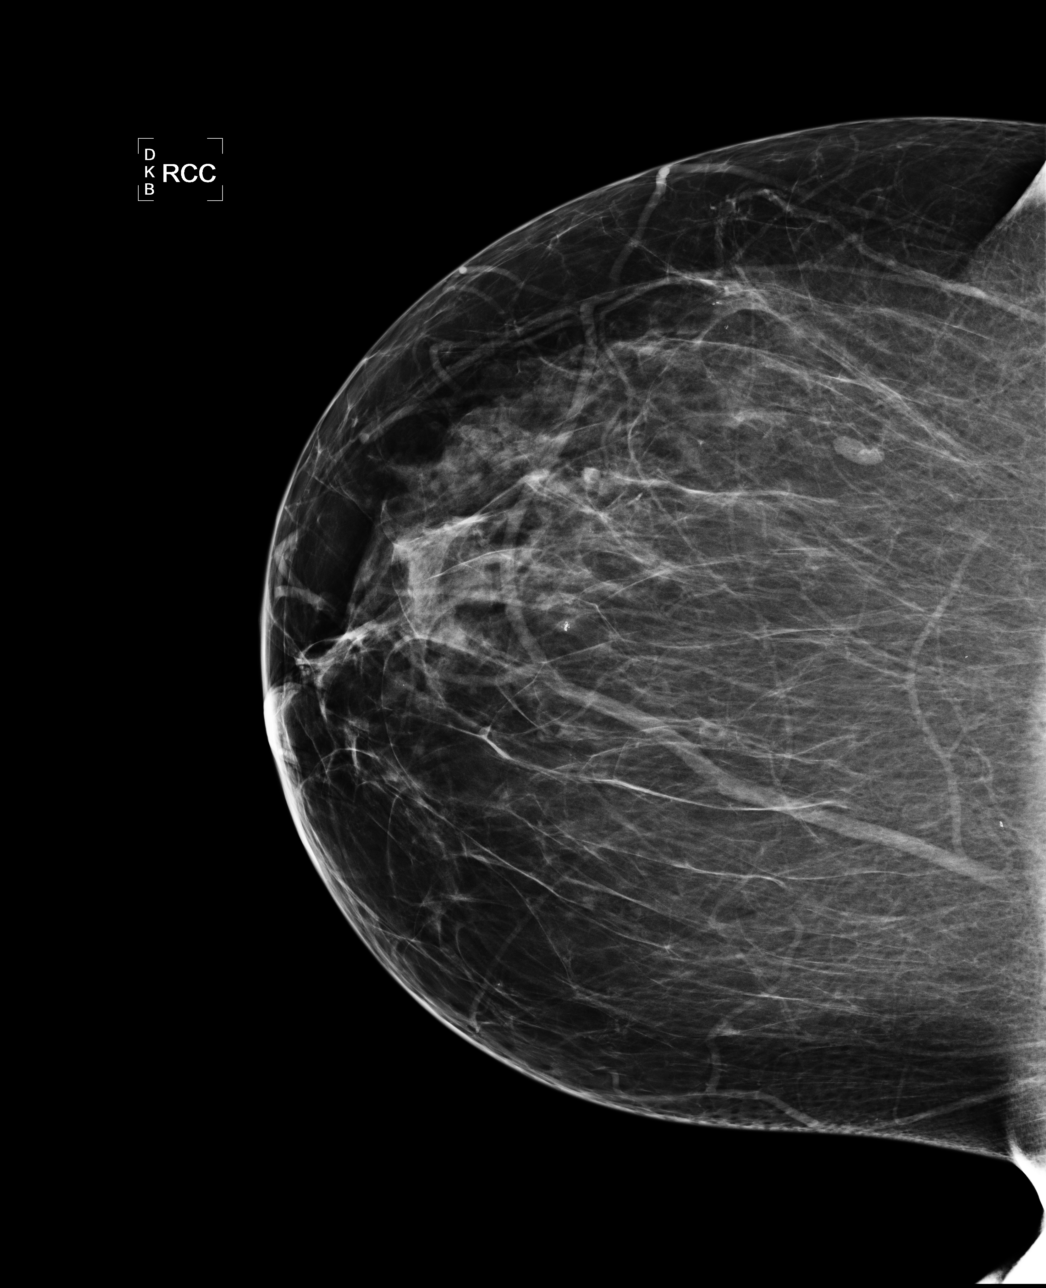

[R MLO]
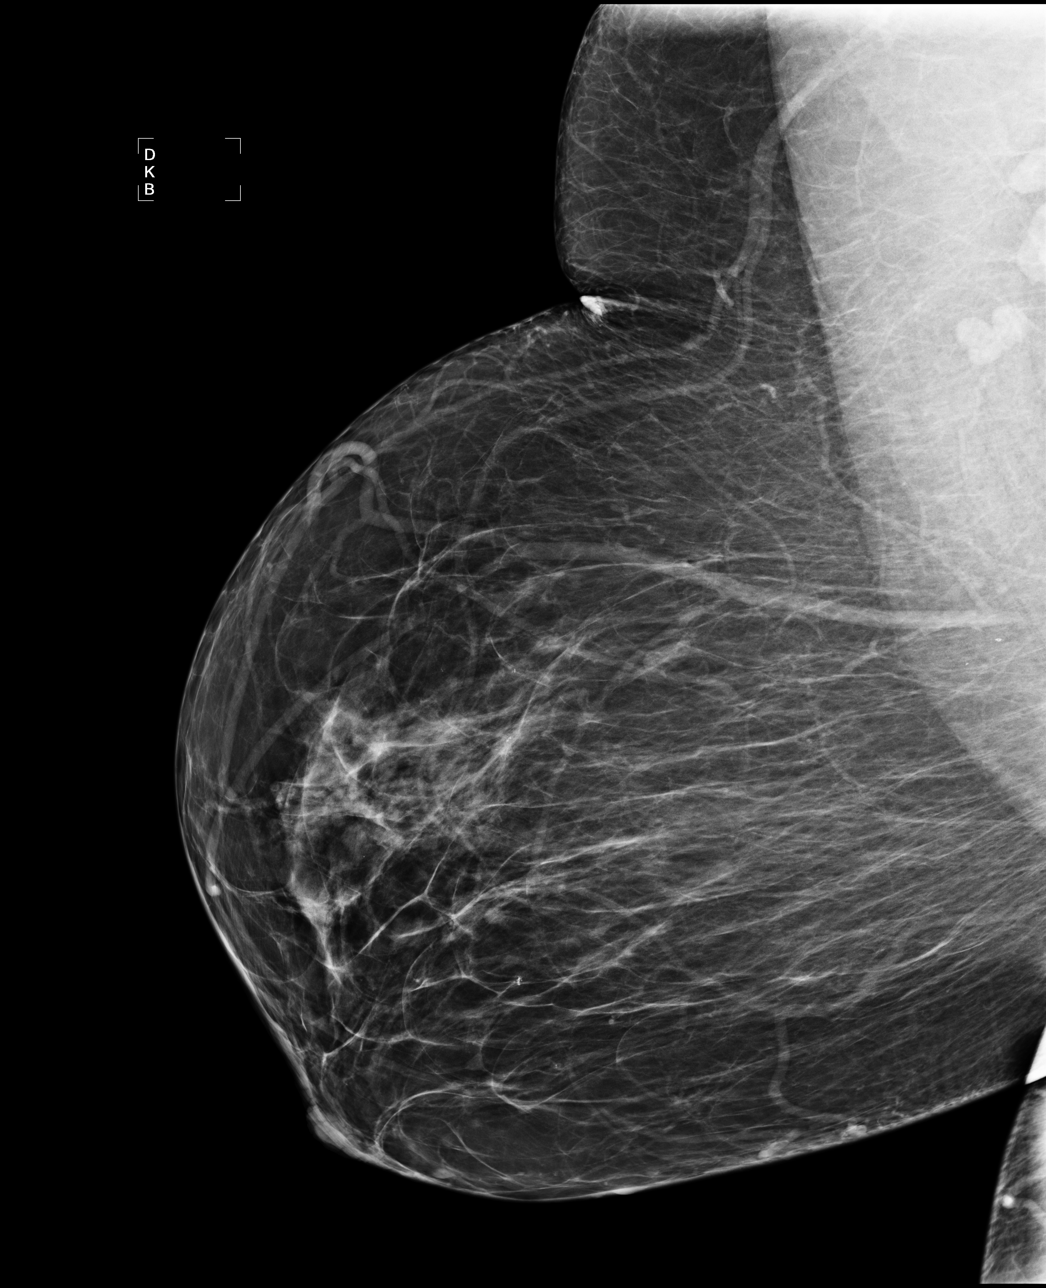

[L CC]
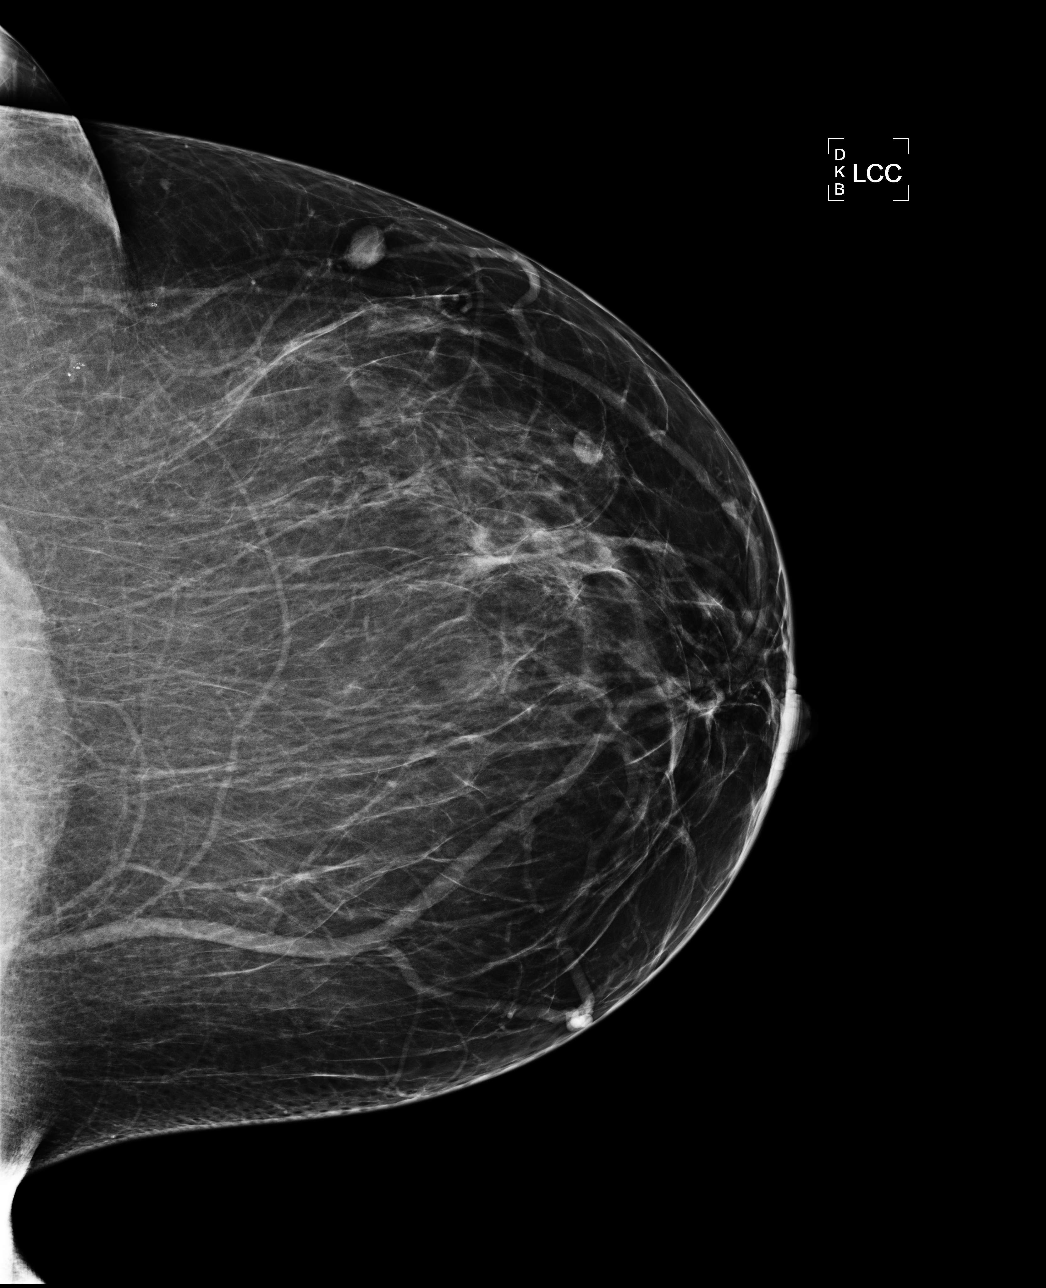

[L MLO]
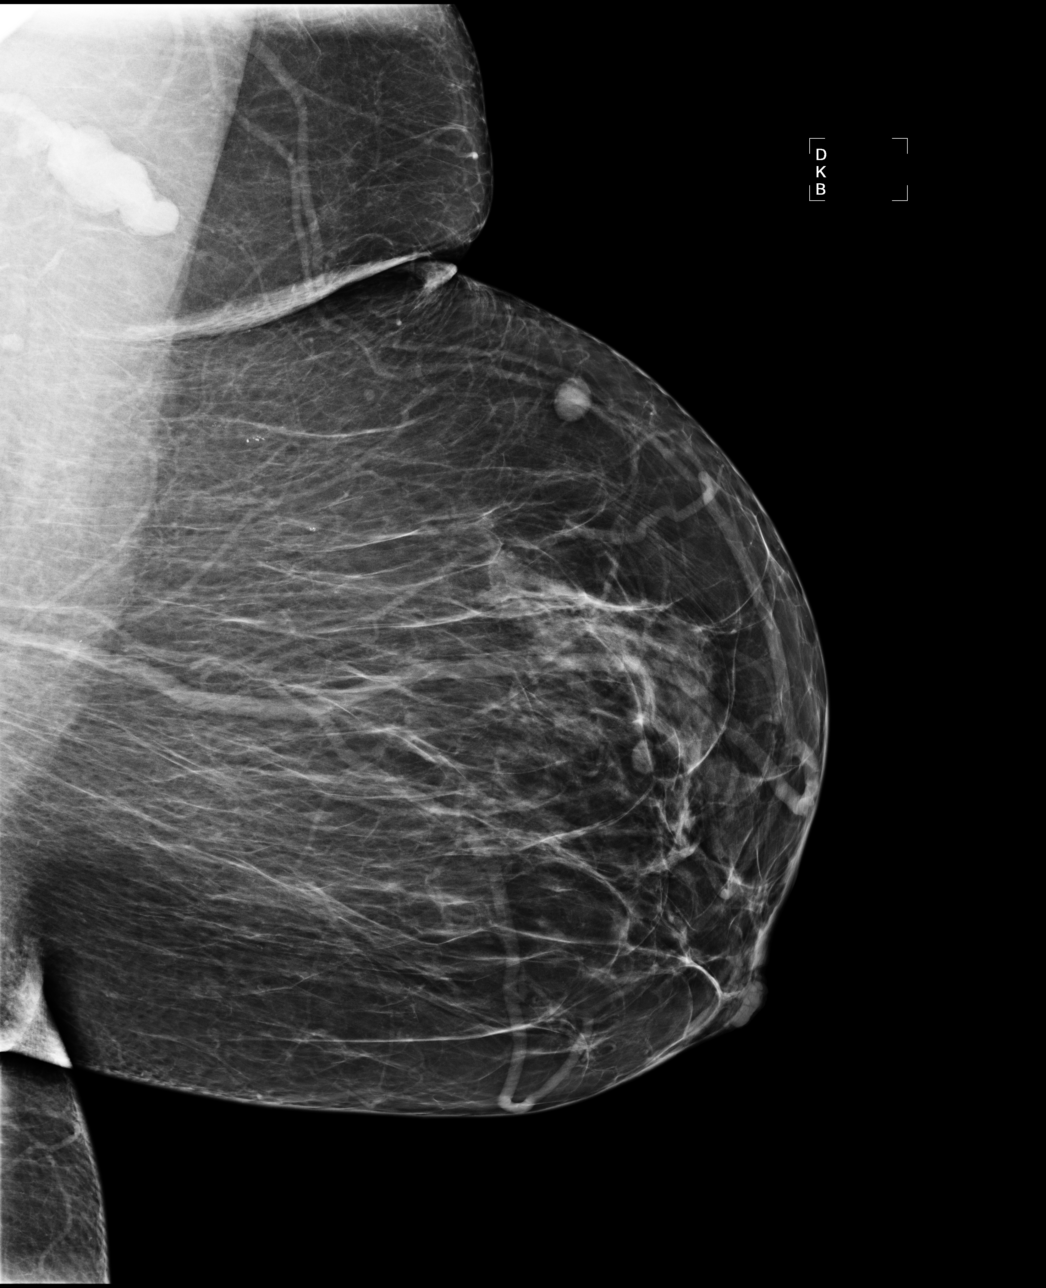

[4 of 4 positions shown; findings below may reference images not displayed]

IMPRESSION: No mammographic evidence of malignancy.  Suggest screening mammography in one year.

ASSESSMENT: Negative - BI-RADS 1

Screening mammogram in 1 year.

## 2008-02-06 IMAGING — CR DG CHEST 2V
2 series · 2 of 2 positions shown · non-contrast
Comparison: 12/06/04.

CLINICAL DATA: Cough, shortness of breath. 
 CHEST - 2 VIEW:

[view not recorded (1 of 2)]
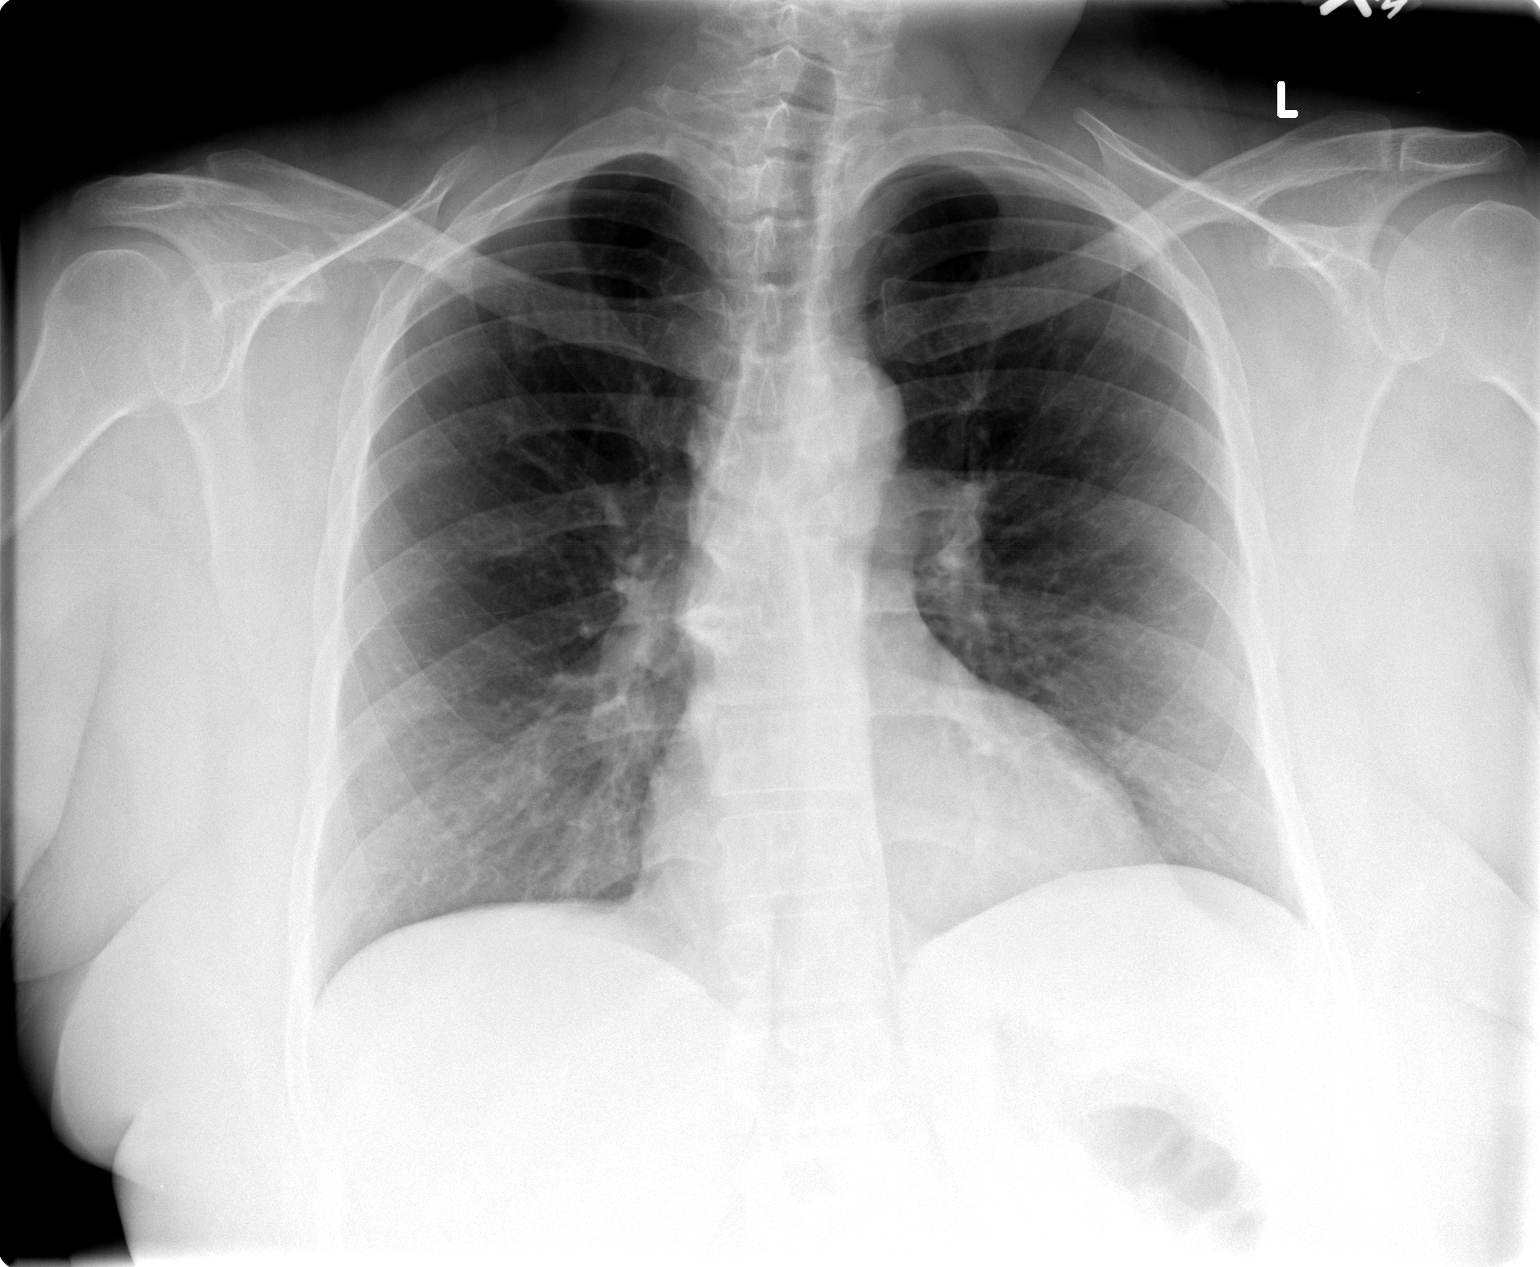

[view not recorded (2 of 2)]
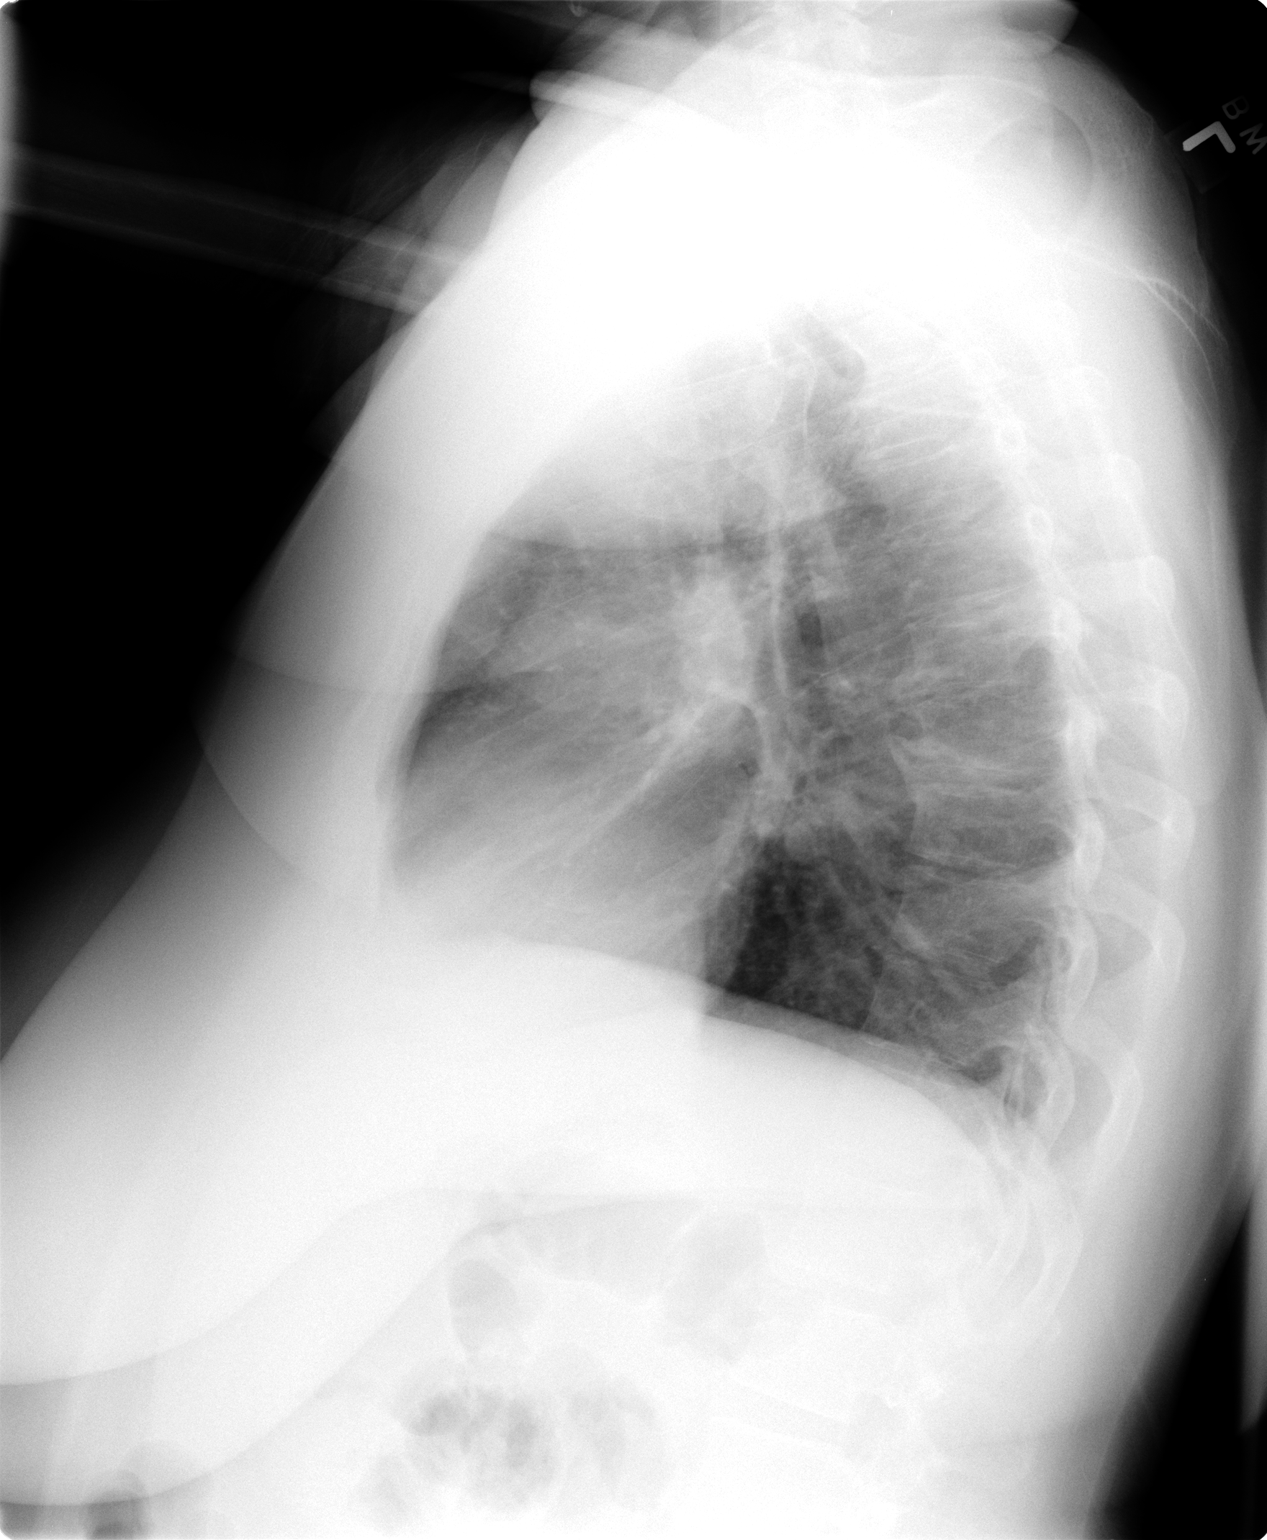

[2 of 2 positions shown; findings below may reference images not displayed]

FINDINGS: Heart size upper normal.  Lungs clear with some mild chronic changes.  No pleural fluid or osseous lesions.  There is degenerative spurring of the spine.
IMPRESSION: Chronic bronchitic type changes ? no active disease.

## 2008-02-09 IMAGING — CR DG CHEST 2V
2 series · 2 of 2 positions shown · non-contrast
Comparison: 01/16/04.
 CHEST - 2 VIEW:

CLINICAL DATA: Cough, bronchitis, wheezing.

[w chest pa]
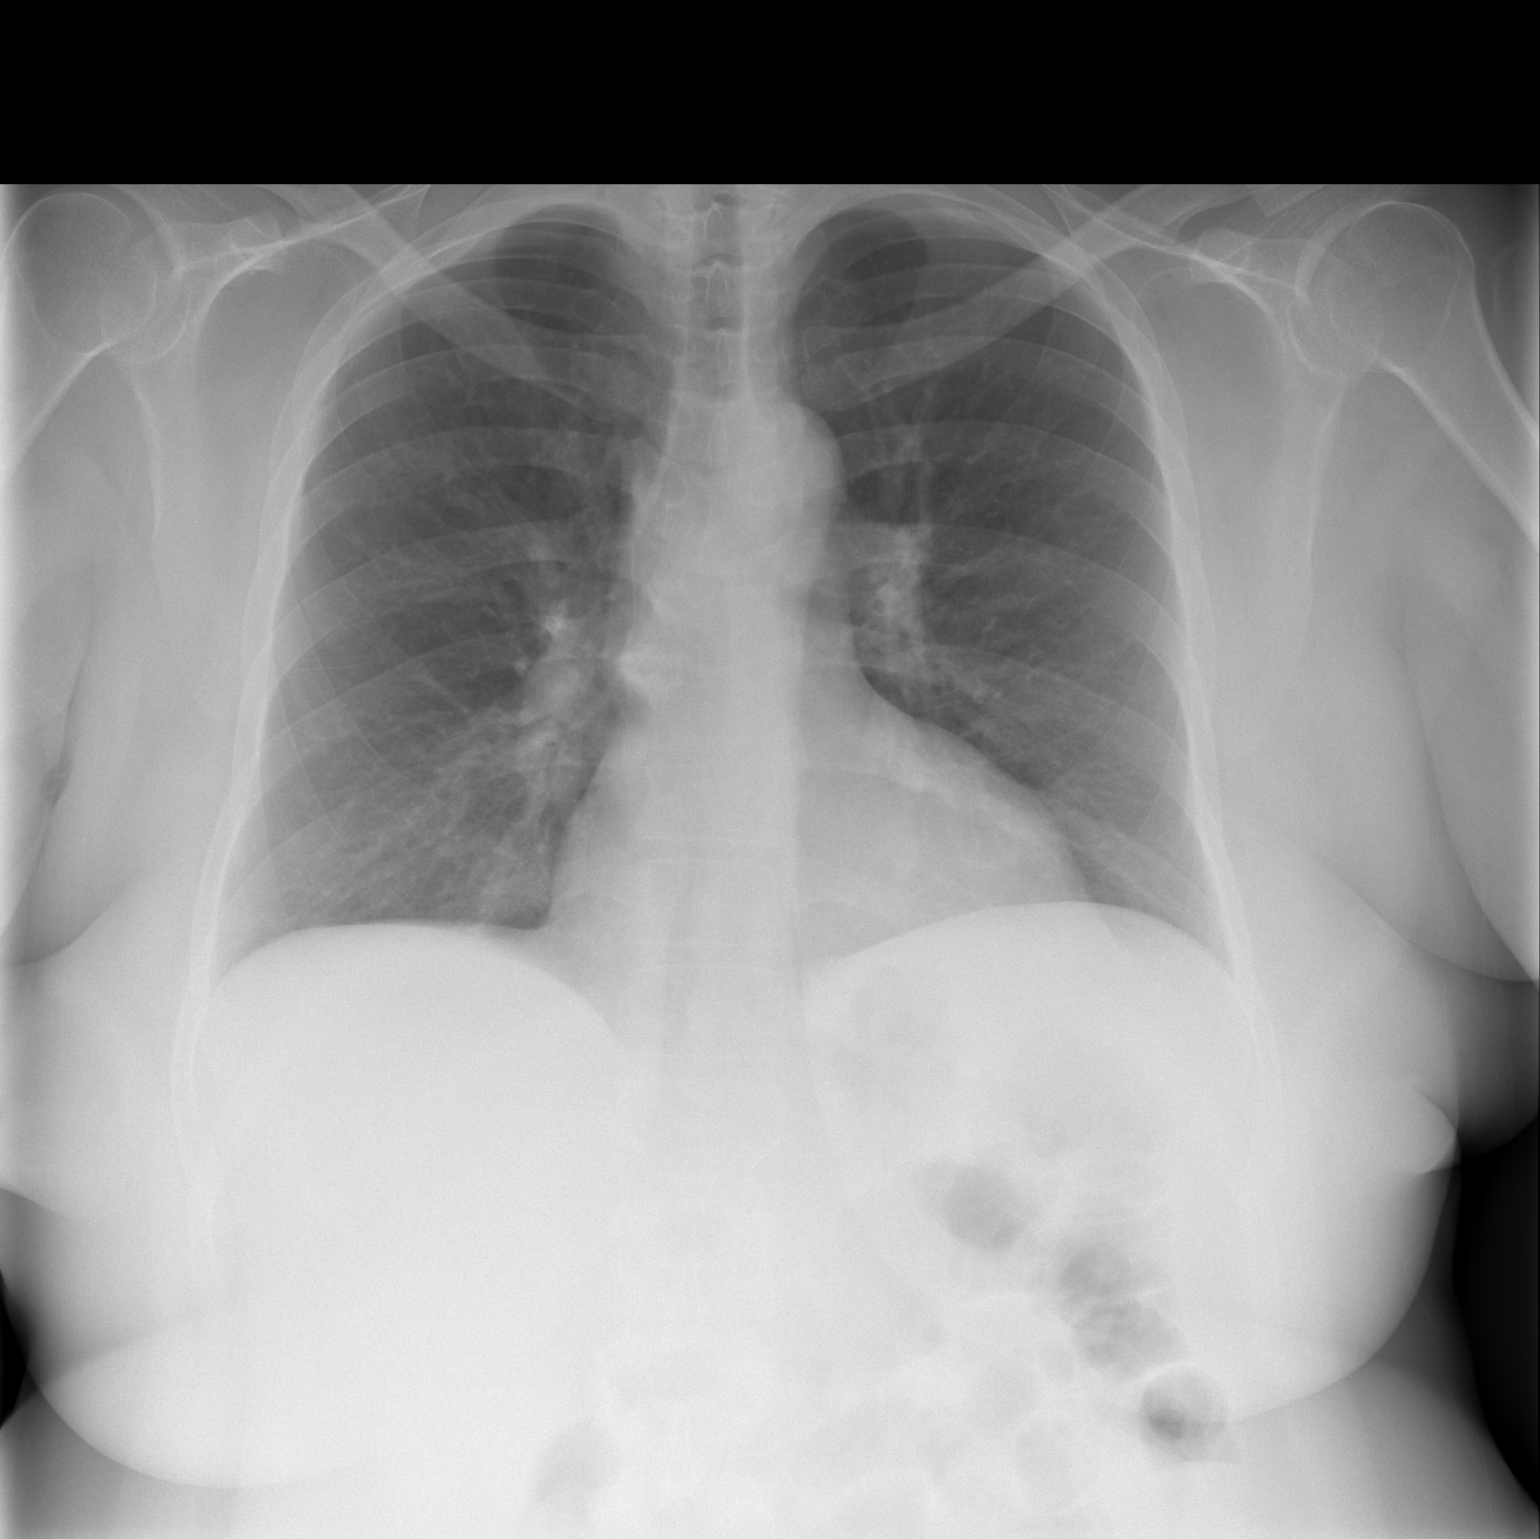

[w chest lat]
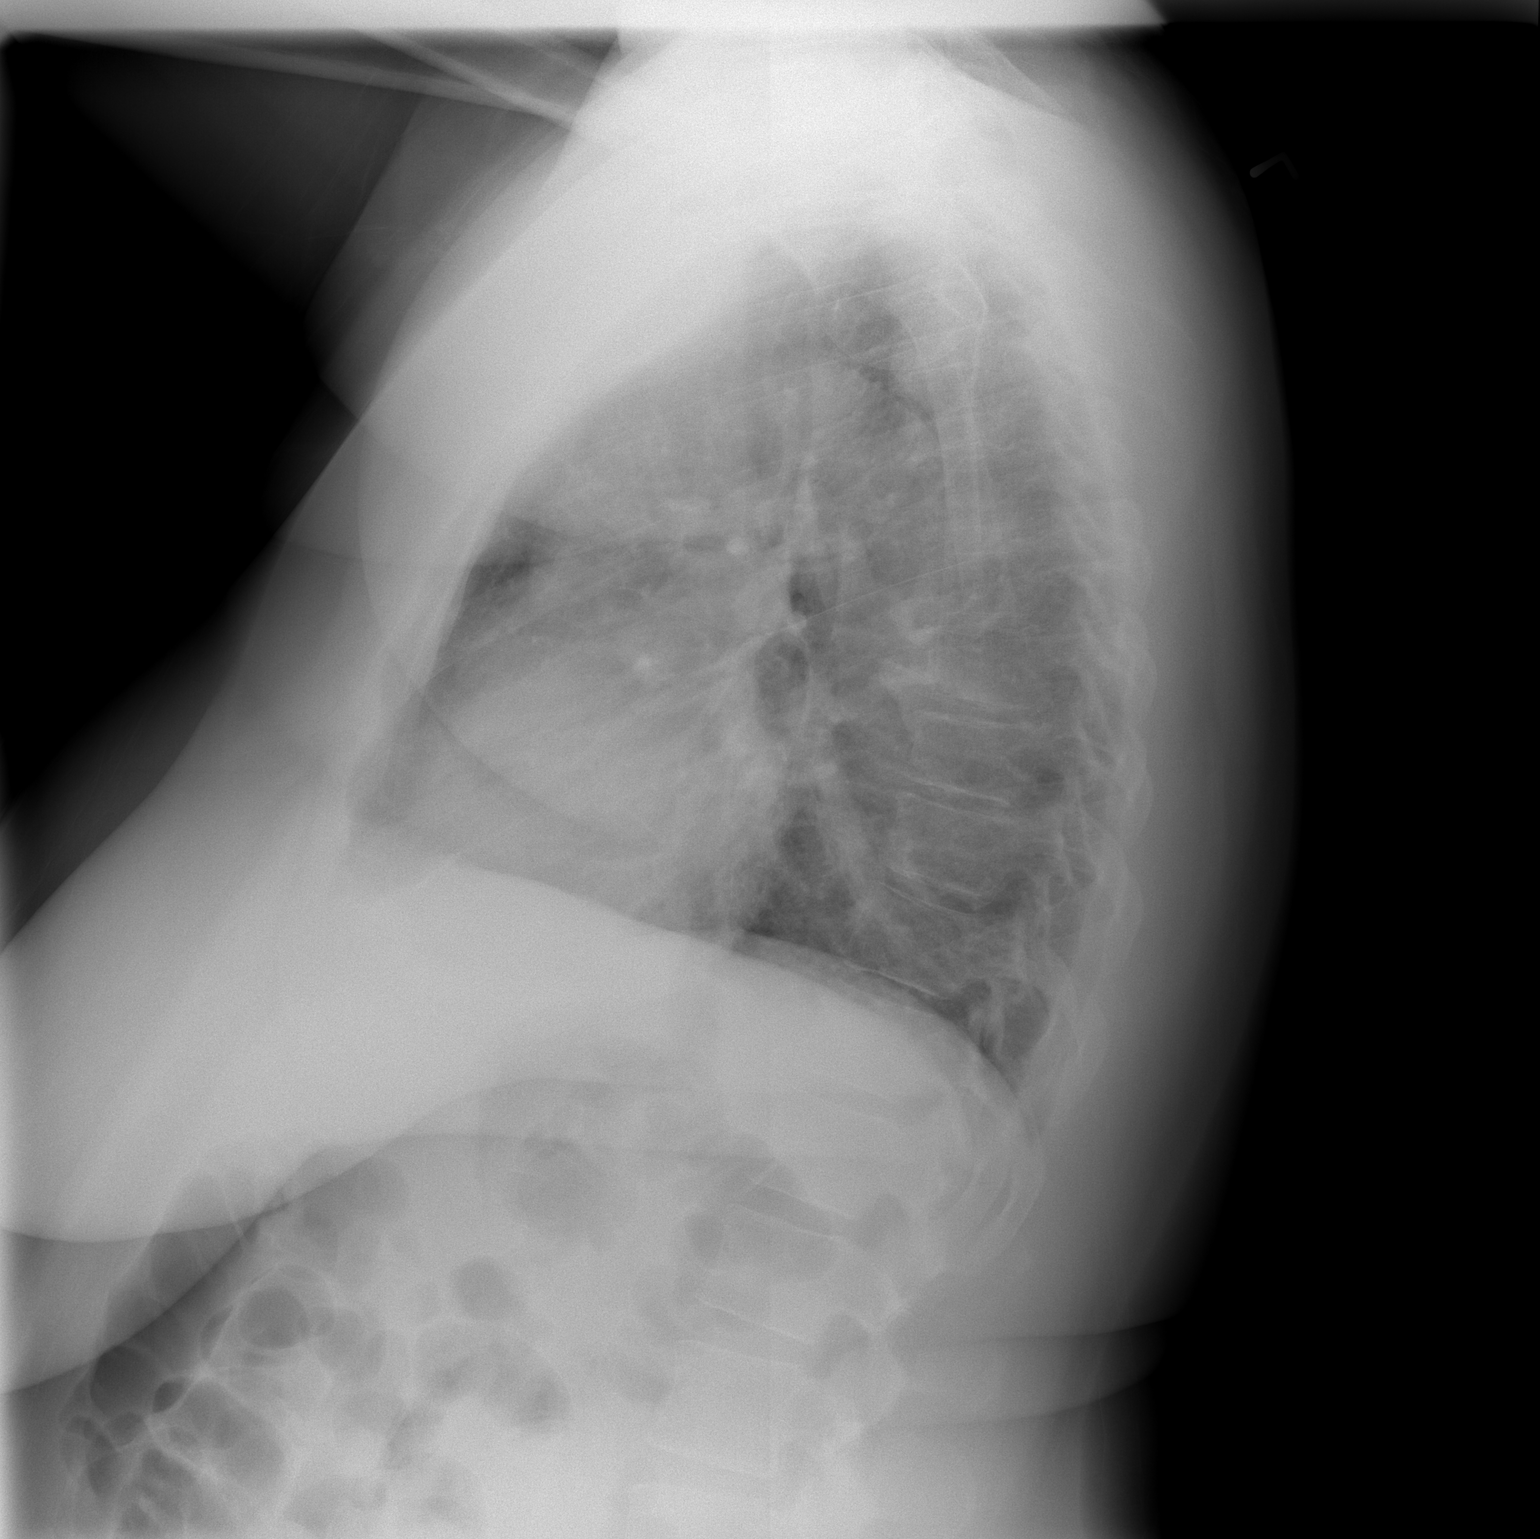

[2 of 2 positions shown; findings below may reference images not displayed]

FINDINGS: The lungs are clear.  Heart size is upper normal.  No effusion or focal bony abnormality.
IMPRESSION: No acute disease.

## 2008-05-04 ENCOUNTER — Emergency Department (HOSPITAL_COMMUNITY): Admission: EM | Admit: 2008-05-04 | Discharge: 2008-05-04 | Payer: Self-pay | Admitting: Family Medicine

## 2008-05-22 ENCOUNTER — Ambulatory Visit (HOSPITAL_COMMUNITY): Admission: RE | Admit: 2008-05-22 | Discharge: 2008-05-22 | Payer: Self-pay | Admitting: Obstetrics and Gynecology

## 2008-07-13 ENCOUNTER — Inpatient Hospital Stay (HOSPITAL_COMMUNITY): Admission: RE | Admit: 2008-07-13 | Discharge: 2008-07-15 | Payer: Self-pay | Admitting: Obstetrics and Gynecology

## 2008-07-13 ENCOUNTER — Encounter: Payer: Self-pay | Admitting: Obstetrics and Gynecology

## 2008-07-21 ENCOUNTER — Emergency Department (HOSPITAL_COMMUNITY): Admission: EM | Admit: 2008-07-21 | Discharge: 2008-07-21 | Payer: Self-pay | Admitting: Emergency Medicine

## 2008-07-23 ENCOUNTER — Inpatient Hospital Stay (HOSPITAL_COMMUNITY): Admission: AD | Admit: 2008-07-23 | Discharge: 2008-07-23 | Payer: Self-pay | Admitting: Obstetrics and Gynecology

## 2008-07-27 ENCOUNTER — Inpatient Hospital Stay (HOSPITAL_COMMUNITY): Admission: AD | Admit: 2008-07-27 | Discharge: 2008-07-30 | Payer: Self-pay | Admitting: Obstetrics and Gynecology

## 2008-08-30 IMAGING — CR DG CHEST 2V
2 series · 2 of 2 positions shown · non-contrast
Comparison: 06/02/2005

CLINICAL DATA: Cough congestion and shortness of breath

[w chest pa]
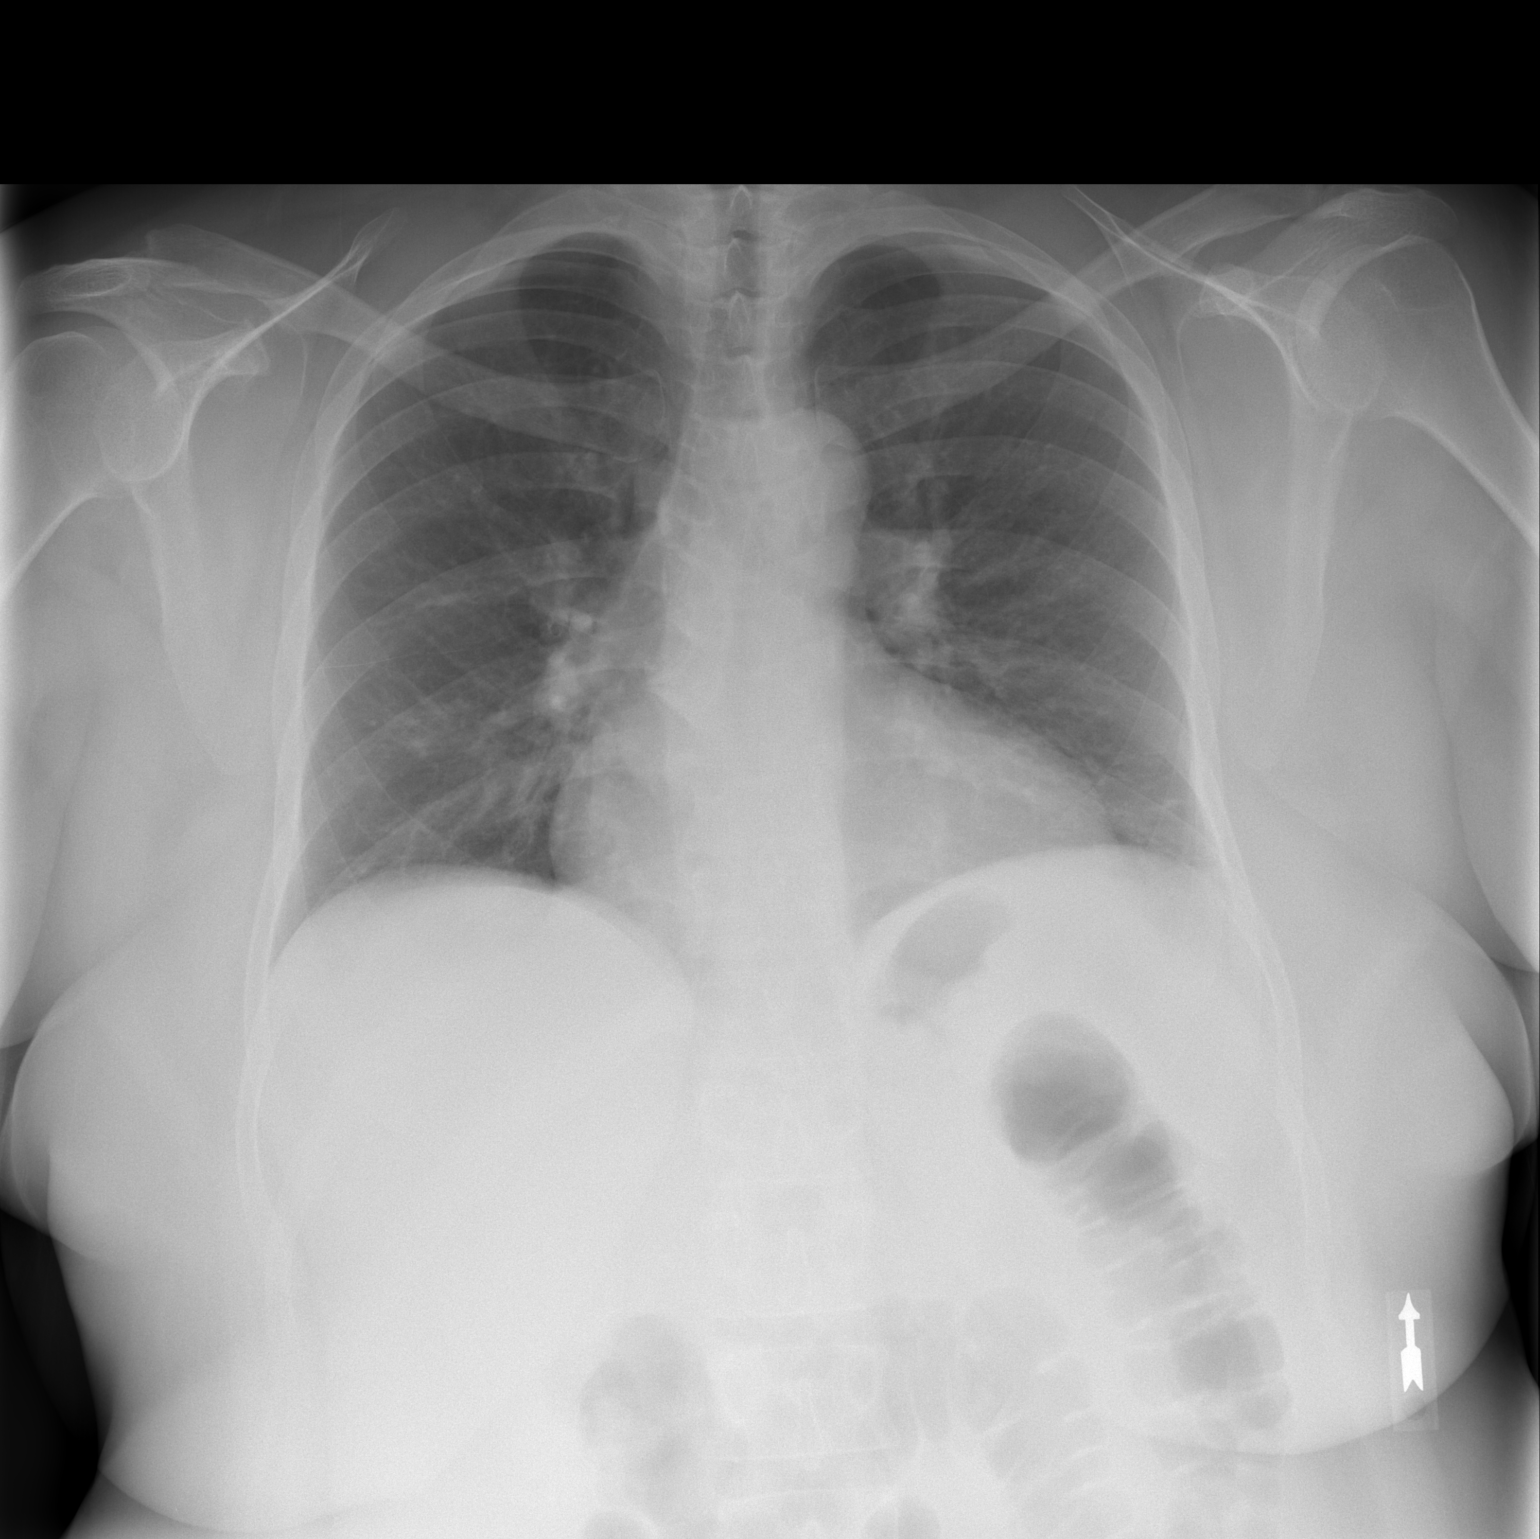

[w chest lat]
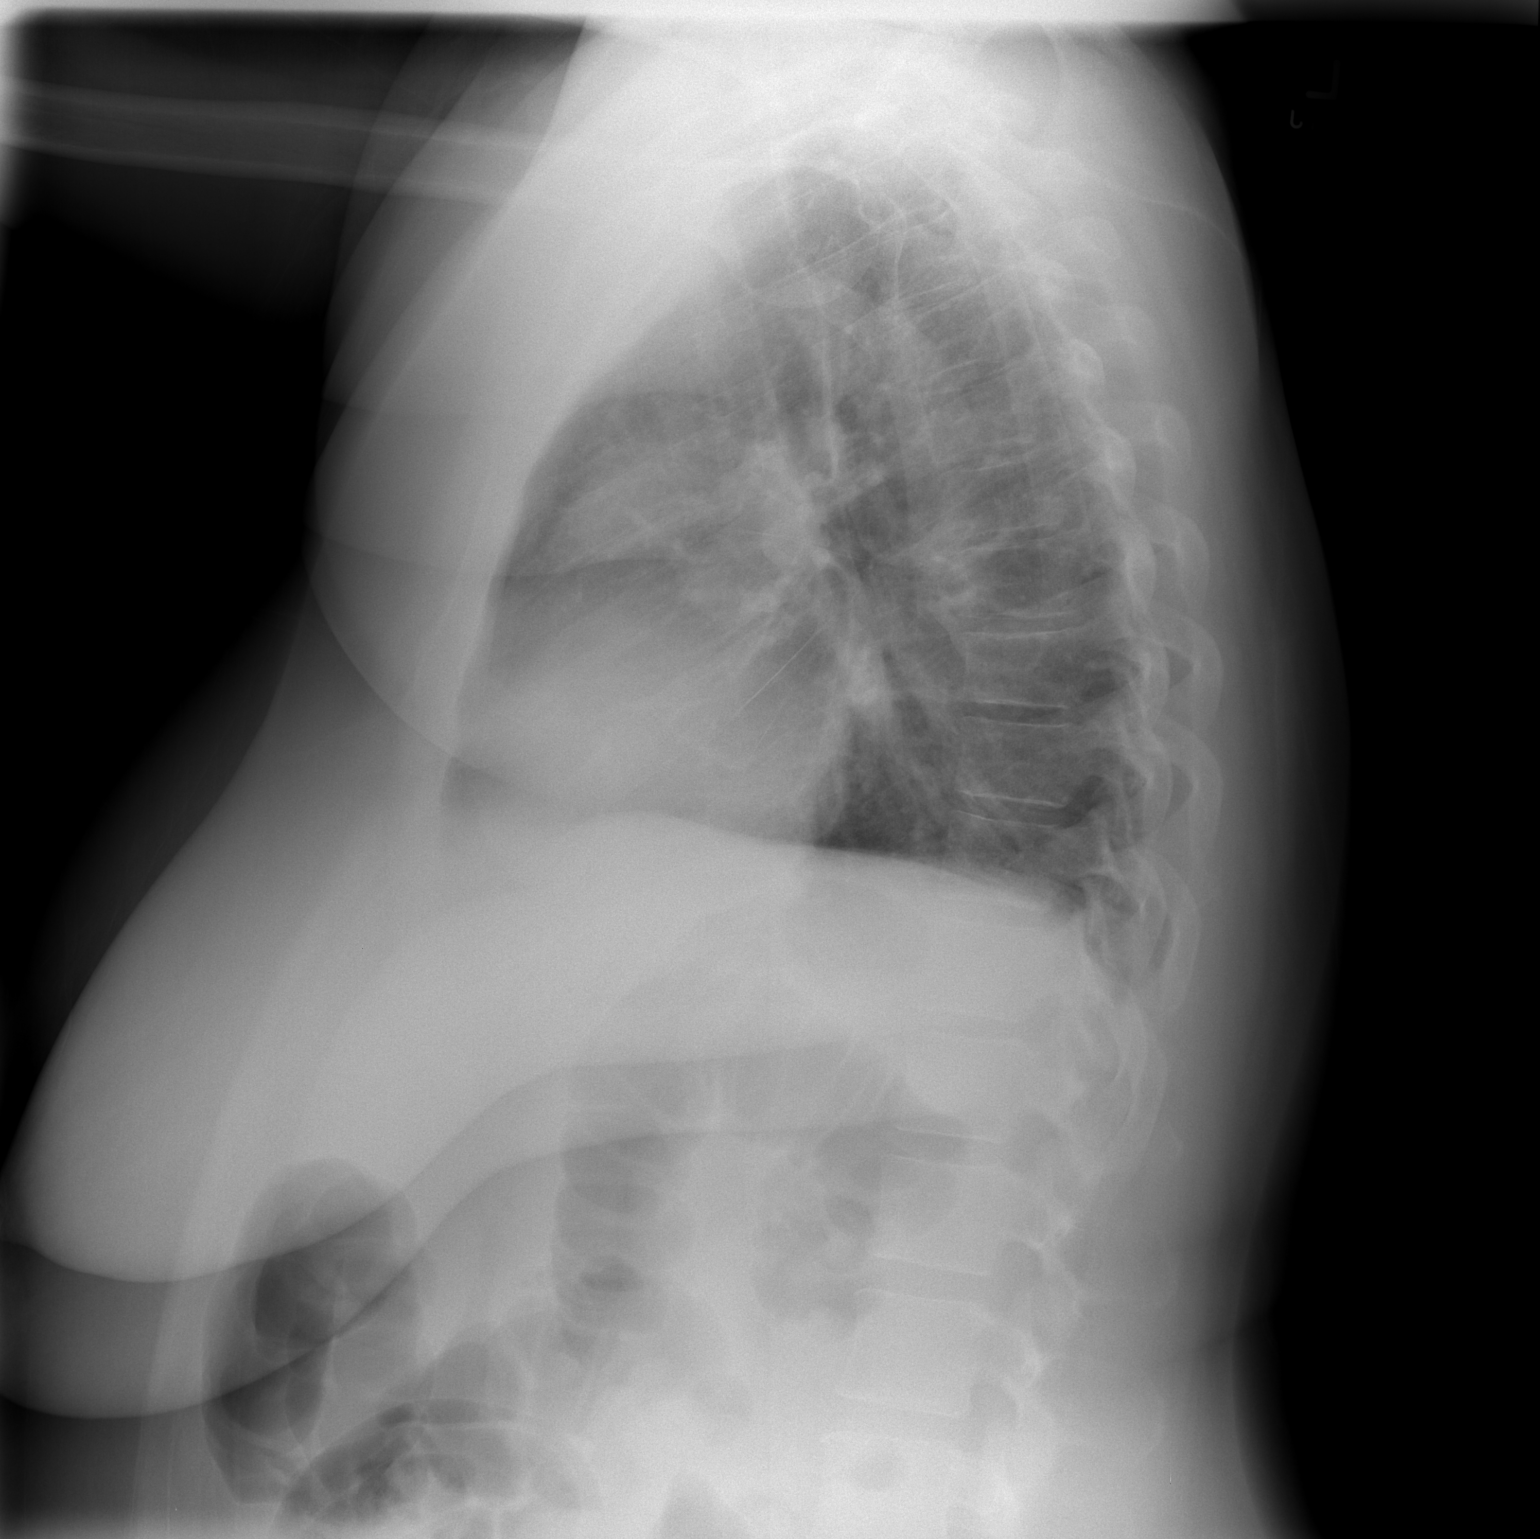

[2 of 2 positions shown; findings below may reference images not displayed]

CHEST - 2 VIEW:

Low volume film without focal consolidation, edema, or pleural effusion. Mild
atelectasis noted at the left lung base. Heart size is at upper limits of
normal, given the low lung volumes. Imaged bony structures of the thorax are
intact.
IMPRESSION: No acute cardiopulmonary process

## 2008-09-01 IMAGING — CT CT PELVIS W/O CM
1 series · 15 of 32 positions shown, 19 images · IV contrast (agent unspecified)
Comparison: none

CLINICAL DATA: Right flank pain and chest pain with shortness of breath. 
 ABDOMEN CT WITHOUT CONTRAST:
TECHNIQUE: Multidetector CT imaging of the abdomen was performed following the standard protocol without IV contrast.
TECHNIQUE: Multidetector CT imaging of the pelvis was performed following the standard protocol without IV contrast.

[Series 2: stone_wo 5.0 b40f st · axial · 0.76mm/px · z∈[-327,+53]mm · 15 of 106 slices shown, 19 images]
[im 7/106  soft-tissue]
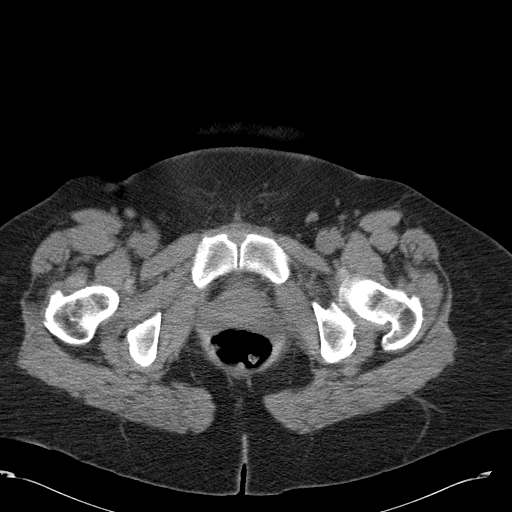
[im 7/106  bone]
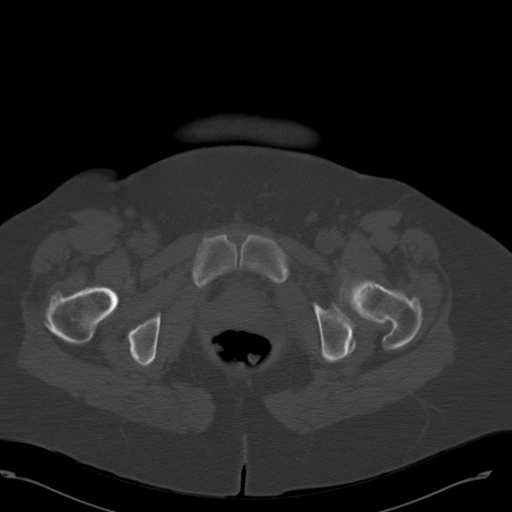
[im 14/106  soft-tissue]
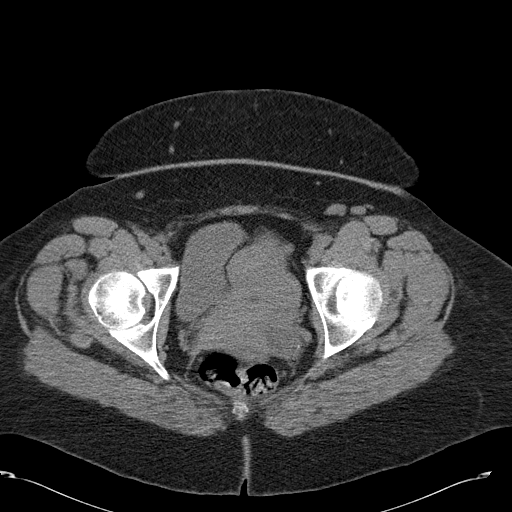
[im 21/106  soft-tissue]
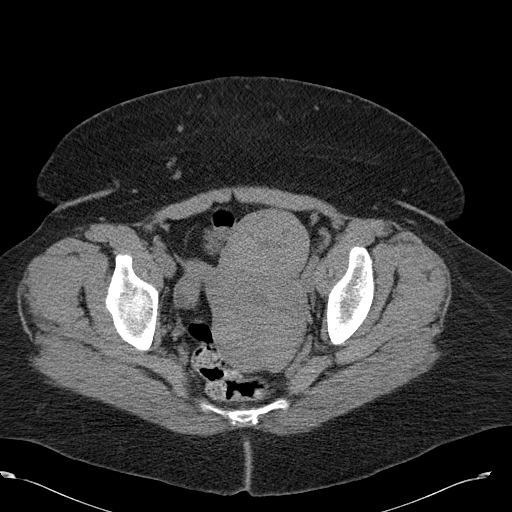
[im 31/106  soft-tissue]
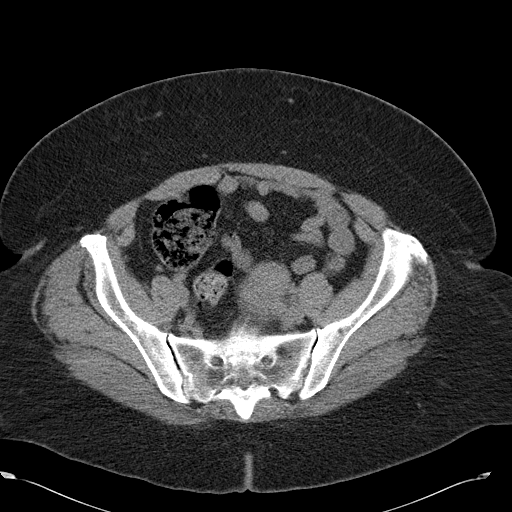
[im 38/106  soft-tissue]
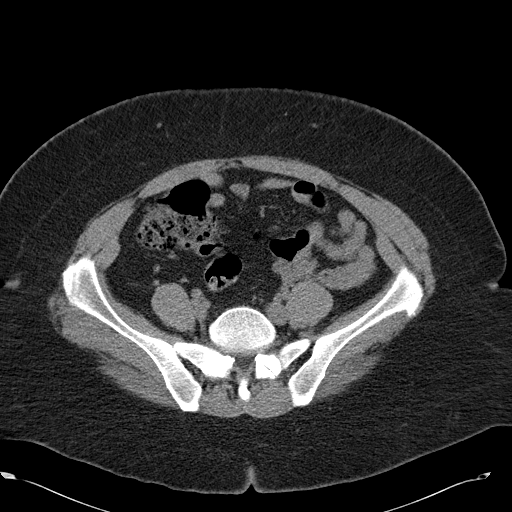
[im 45/106  soft-tissue]
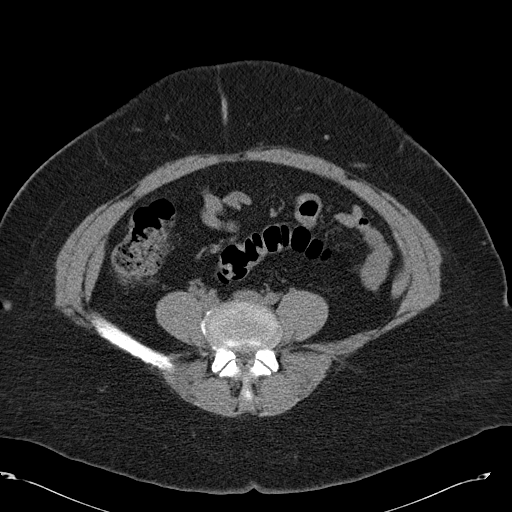
[im 55/106  soft-tissue]
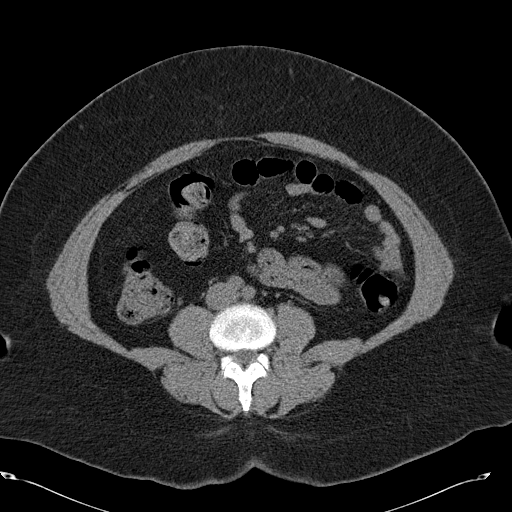
[im 61/106  soft-tissue]
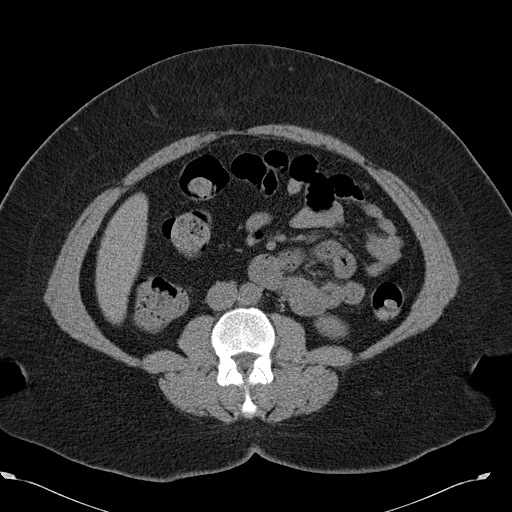
[im 68/106  soft-tissue]
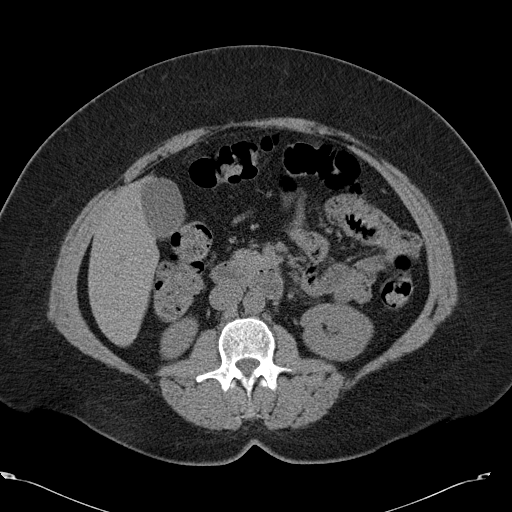
[im 68/106  bone]
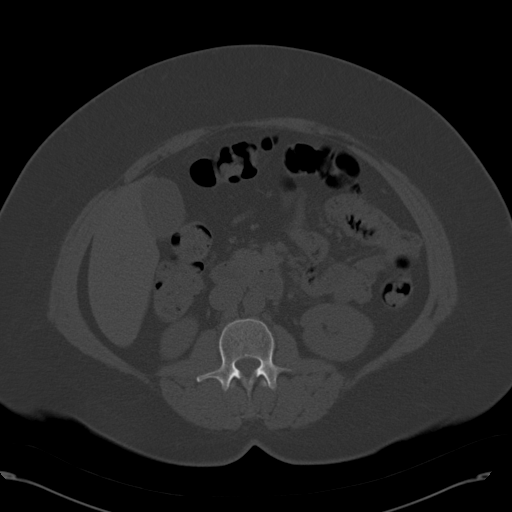
[im 75/106  soft-tissue]
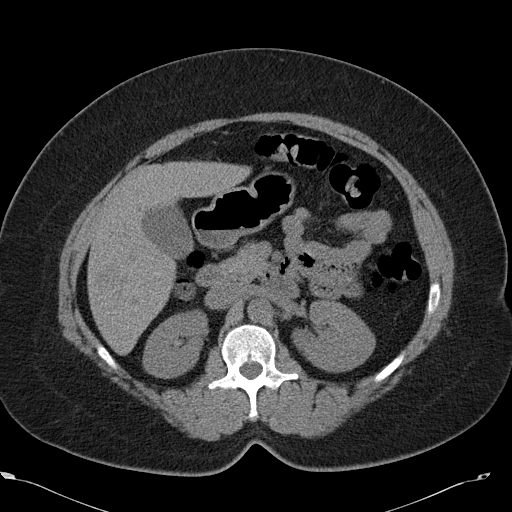
[im 85/106  soft-tissue]
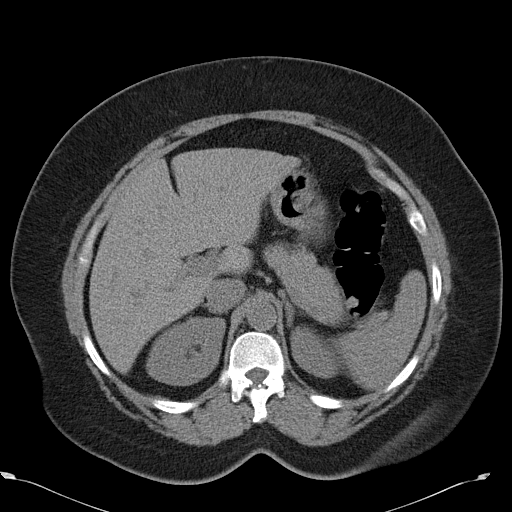
[im 92/106  soft-tissue]
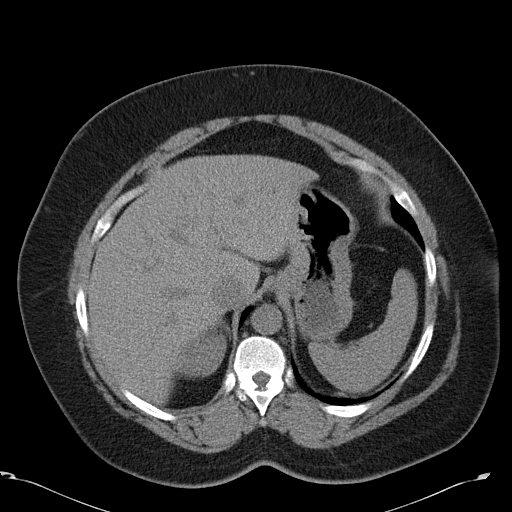
[im 92/106  lung]
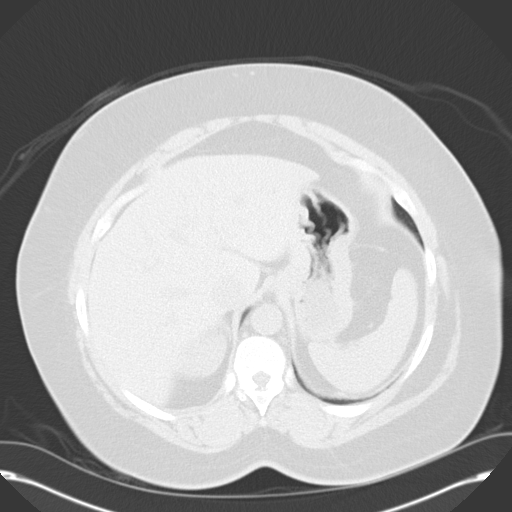
[im 95/106  lung]
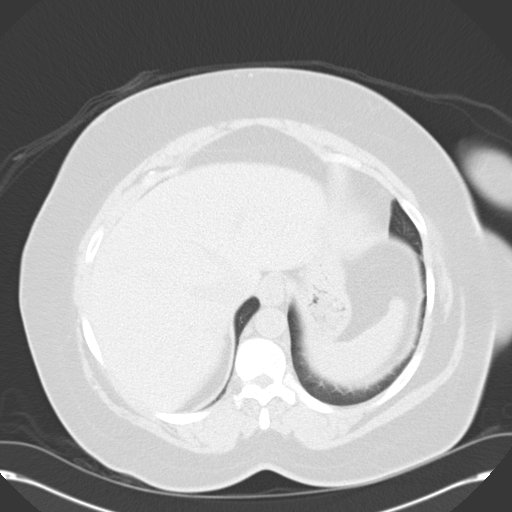
[im 99/106  soft-tissue]
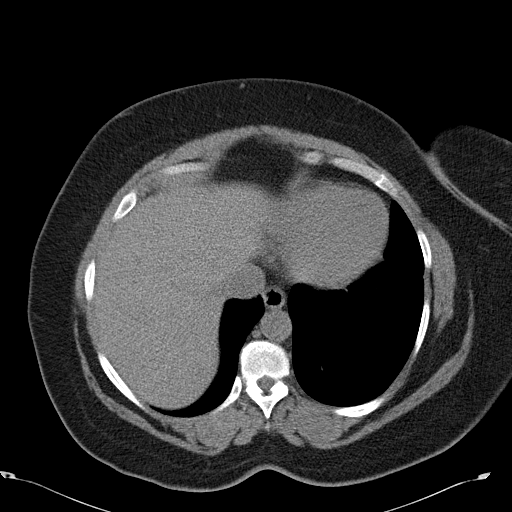
[im 99/106  lung]
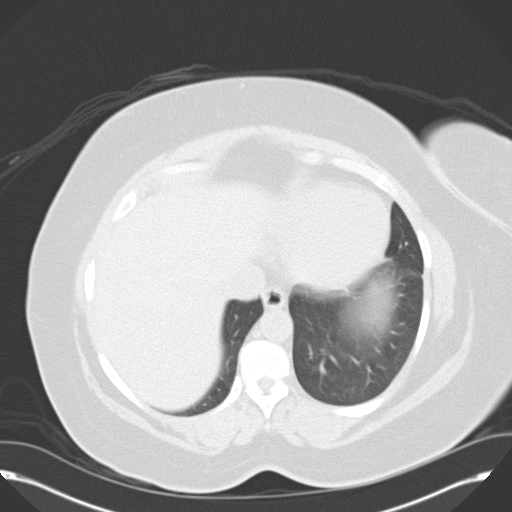
[im 102/106  lung]
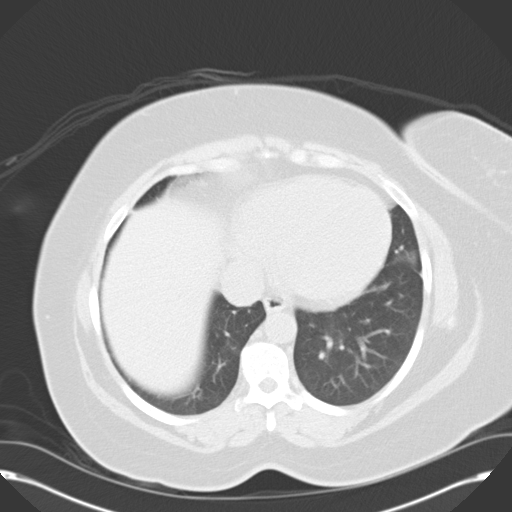

[15 of 32 positions shown; findings below may reference images not displayed]

FINDINGS: There is a vague 2.5-cm low-density lesion in the lateral aspect of the inferior portion of the right lobe of the liver, nonspecific.  CT scan with contrast with multiple phases could help determine the character of this lesion.  The spleen, pancreas, adrenal glands, and kidneys appear normal.  No renal calculi or ureteral dilatation.  The bowel is normal.  No free air or free fluid.
IMPRESSION: Nonspecific vague 2.5-cm lesion of the right lobe of the liver.  Further characteristic with contrast CT may be useful.  I doubt that this is the cause of the patient's flank pain. 
 PELVIS CT WITHOUT CONTRAST:
FINDINGS: The terminal ileum and the appendix appear normal.  The right ovary is identified and appears normal. The patient has what appear to be multiple fibroids within the uterus.  The uterus is large and measures approximately 13 x 8 x 10 cm.  The left ovary appears to be inferior along the inferior aspect of the left side of the uterus.  No free air or free fluid within the pelvis.
IMPRESSION: Multiple uterine fibroids.  No other significant abnormality.  No acute findings.

## 2009-01-09 IMAGING — CR DG LUMBAR SPINE COMPLETE 4+V
5 series · 5 of 5 positions shown · non-contrast
Comparison: 07/17/2001

CLINICAL DATA: Fell [REDACTED] with pain in the low back. 
LUMBAR SPINE - 4 VIEW:

[view not recorded (1 of 5)]
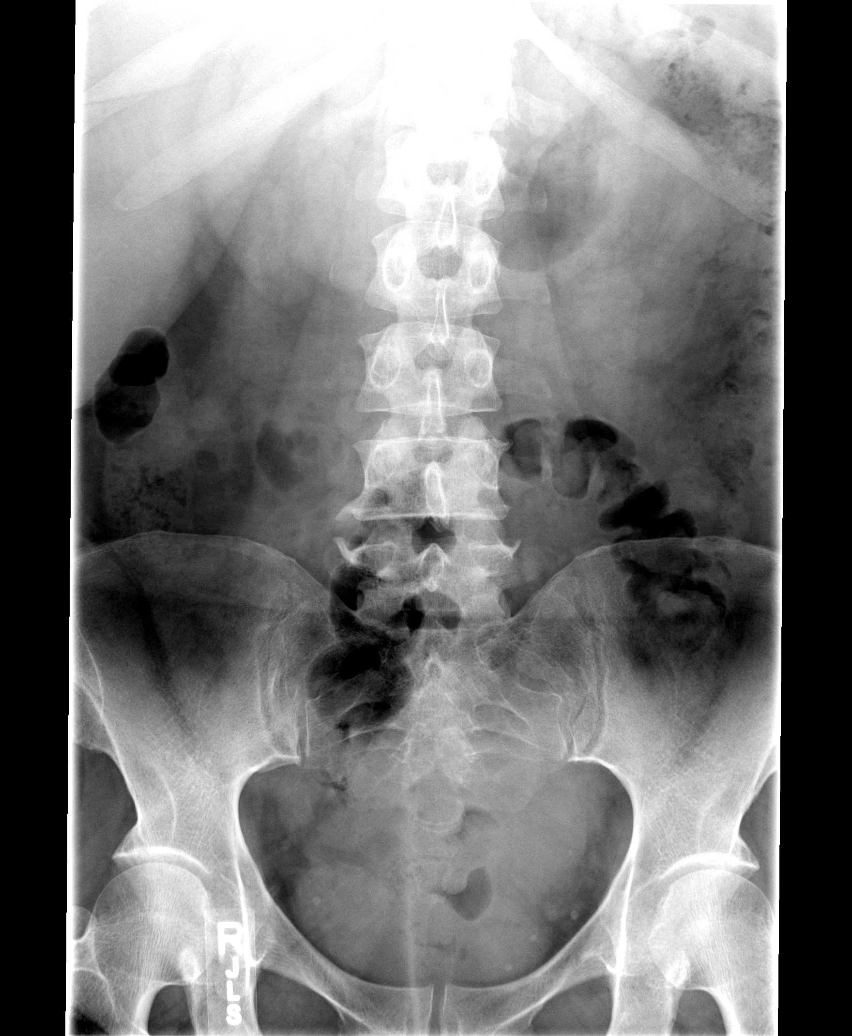

[view not recorded (2 of 5)]
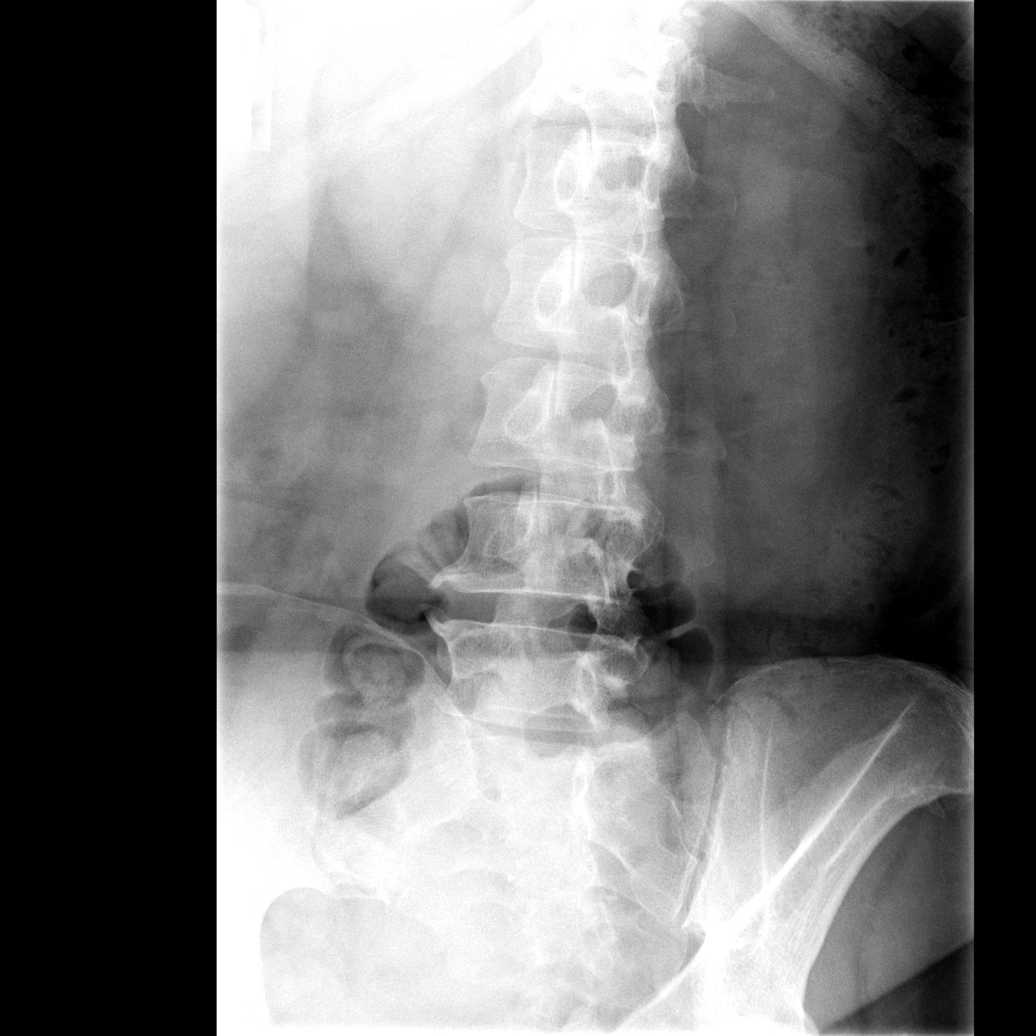

[view not recorded (3 of 5)]
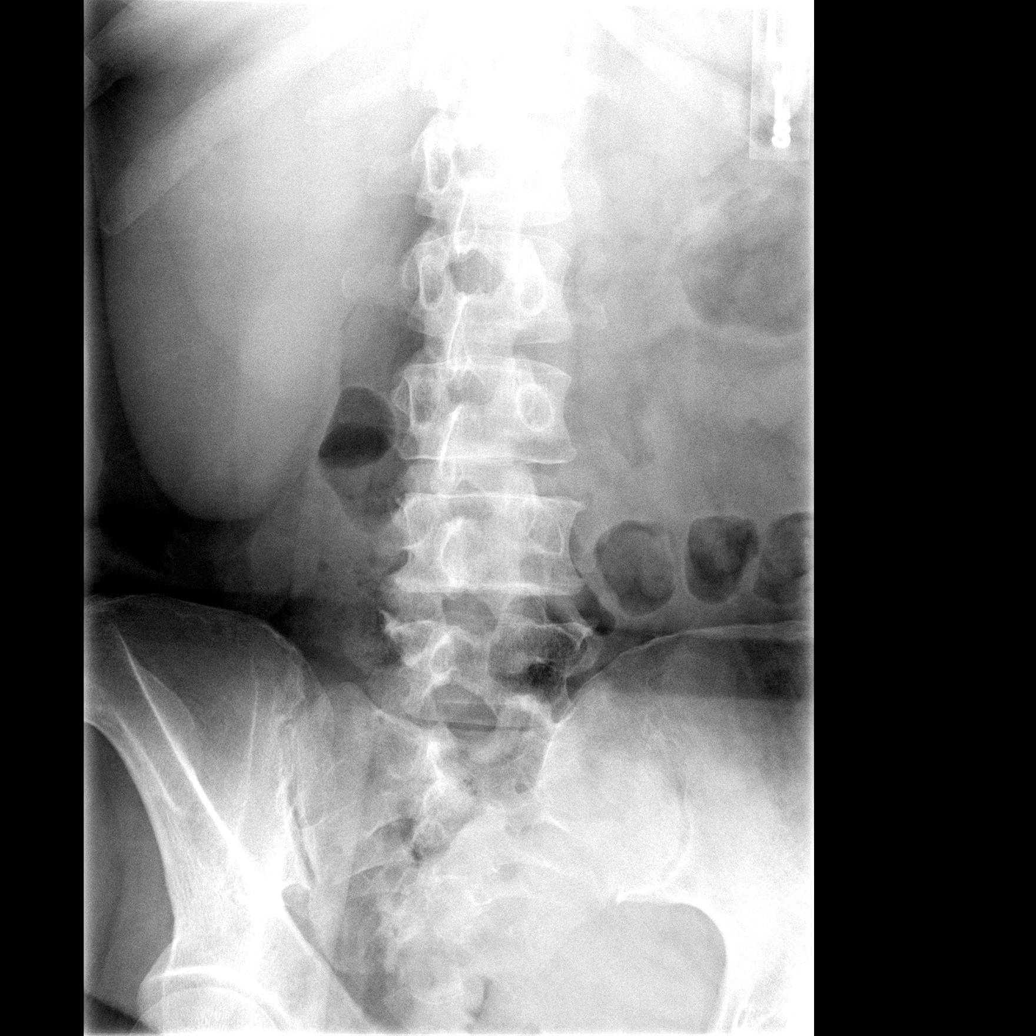

[view not recorded (4 of 5)]
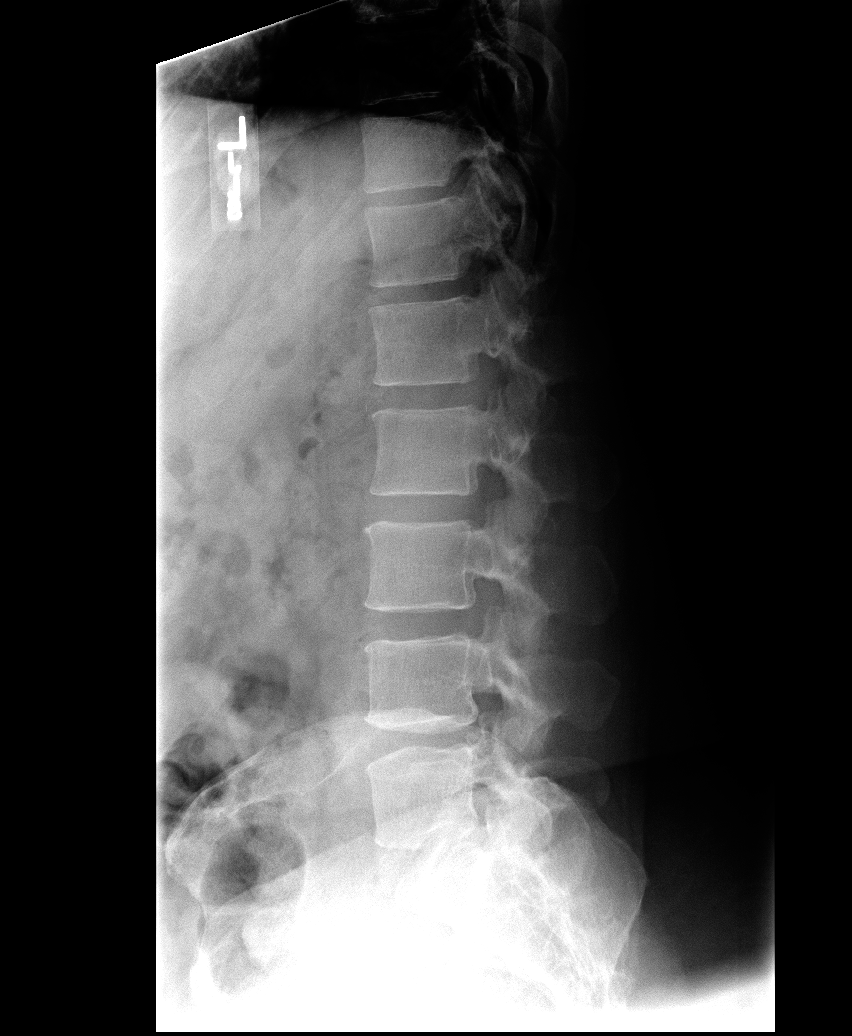

[view not recorded (5 of 5)]
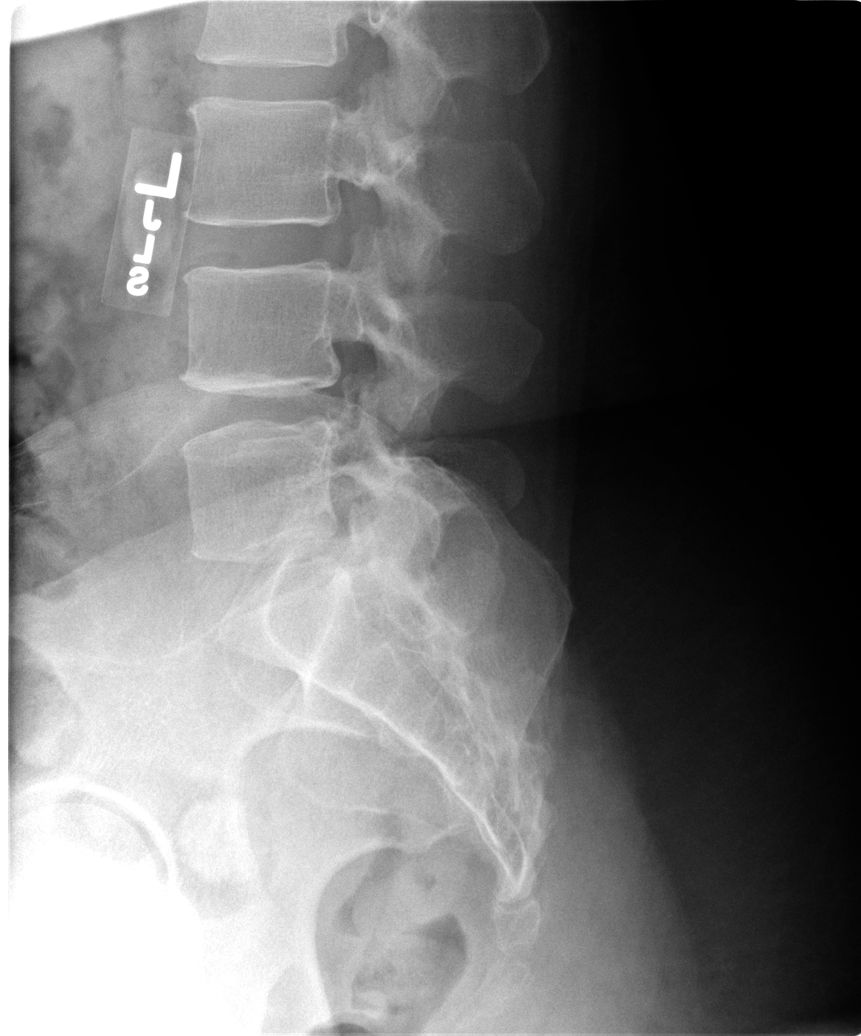

[5 of 5 positions shown; findings below may reference images not displayed]

FINDINGS: The lumbar vertebrae are in normal alignment with normal intervertebral disk spaces.  No acute compression deformity is seen.  The SI joints appear normal.
IMPRESSION: Normal alignment with no acute abnormality.

## 2009-01-09 IMAGING — CR DG CHEST 2V
2 series · 2 of 2 positions shown · non-contrast
Comparison: 06/05/2005

CLINICAL DATA: Fell [REDACTED] night with pain upper right chest. 
 CHEST - 2 VIEW:

[view not recorded (1 of 2)]
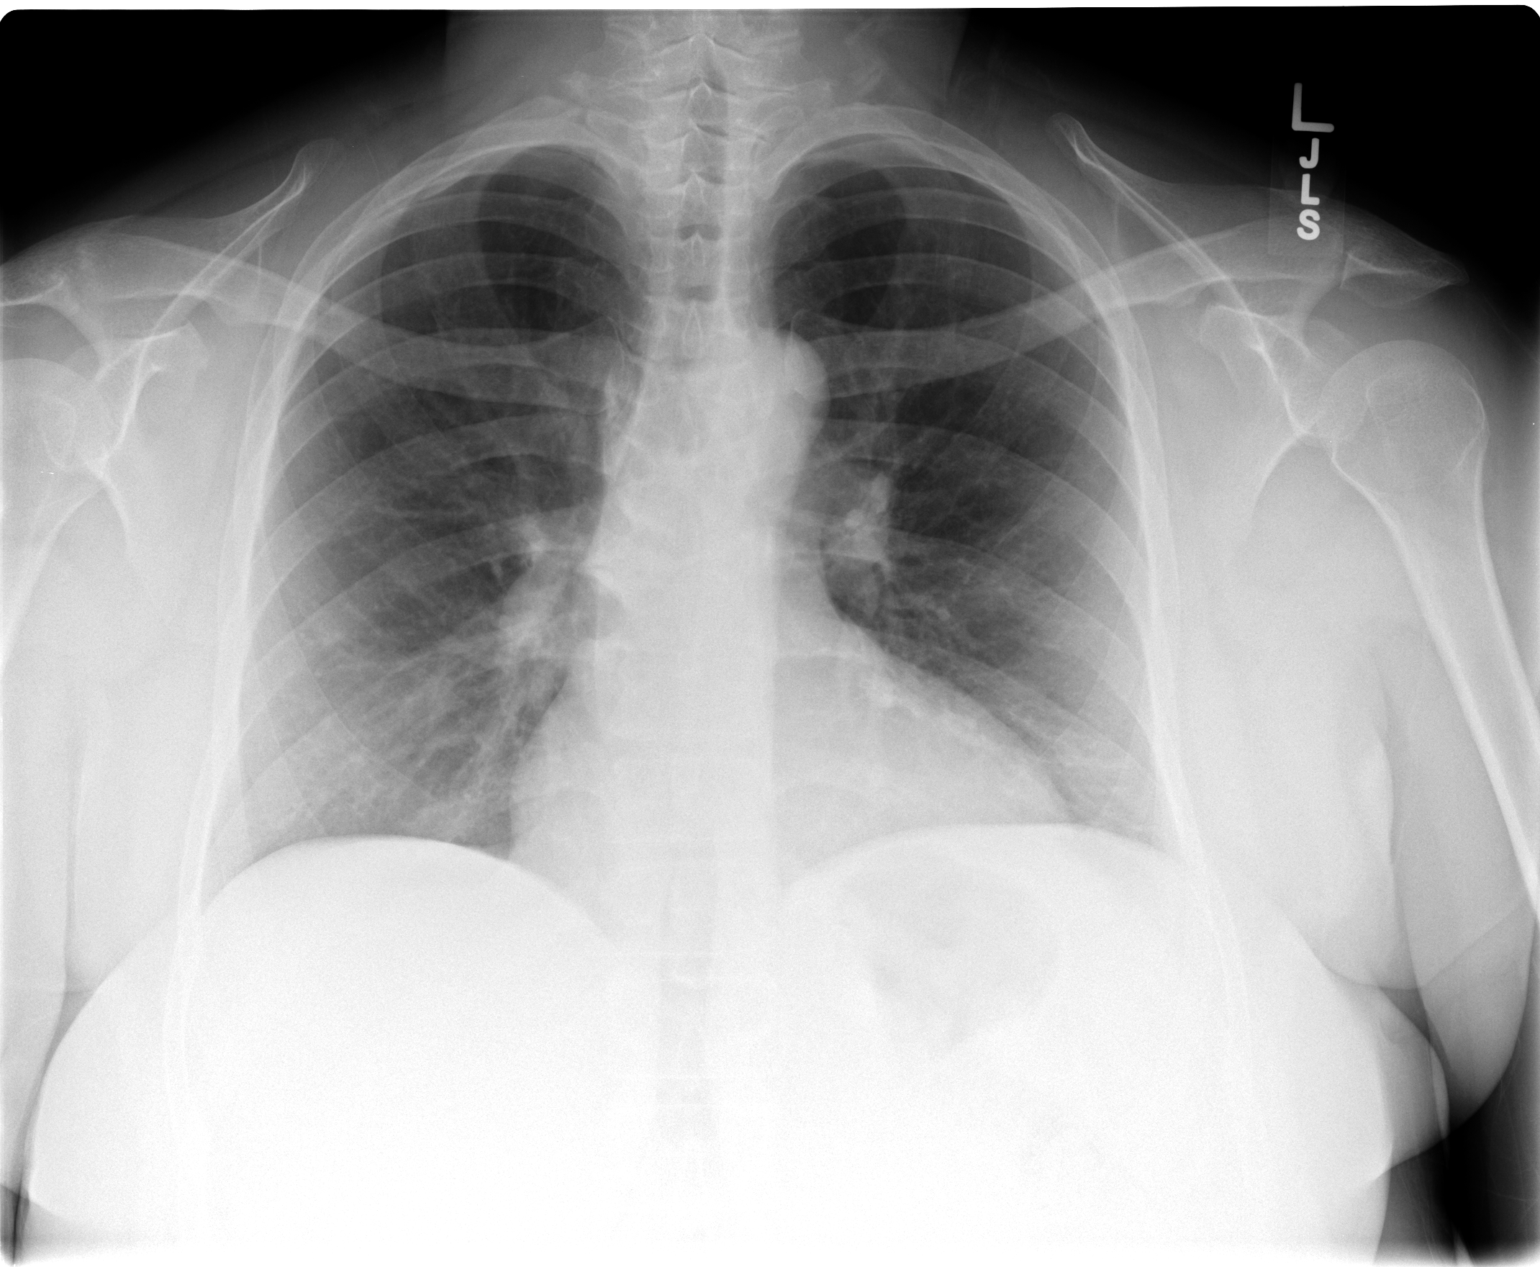

[view not recorded (2 of 2)]
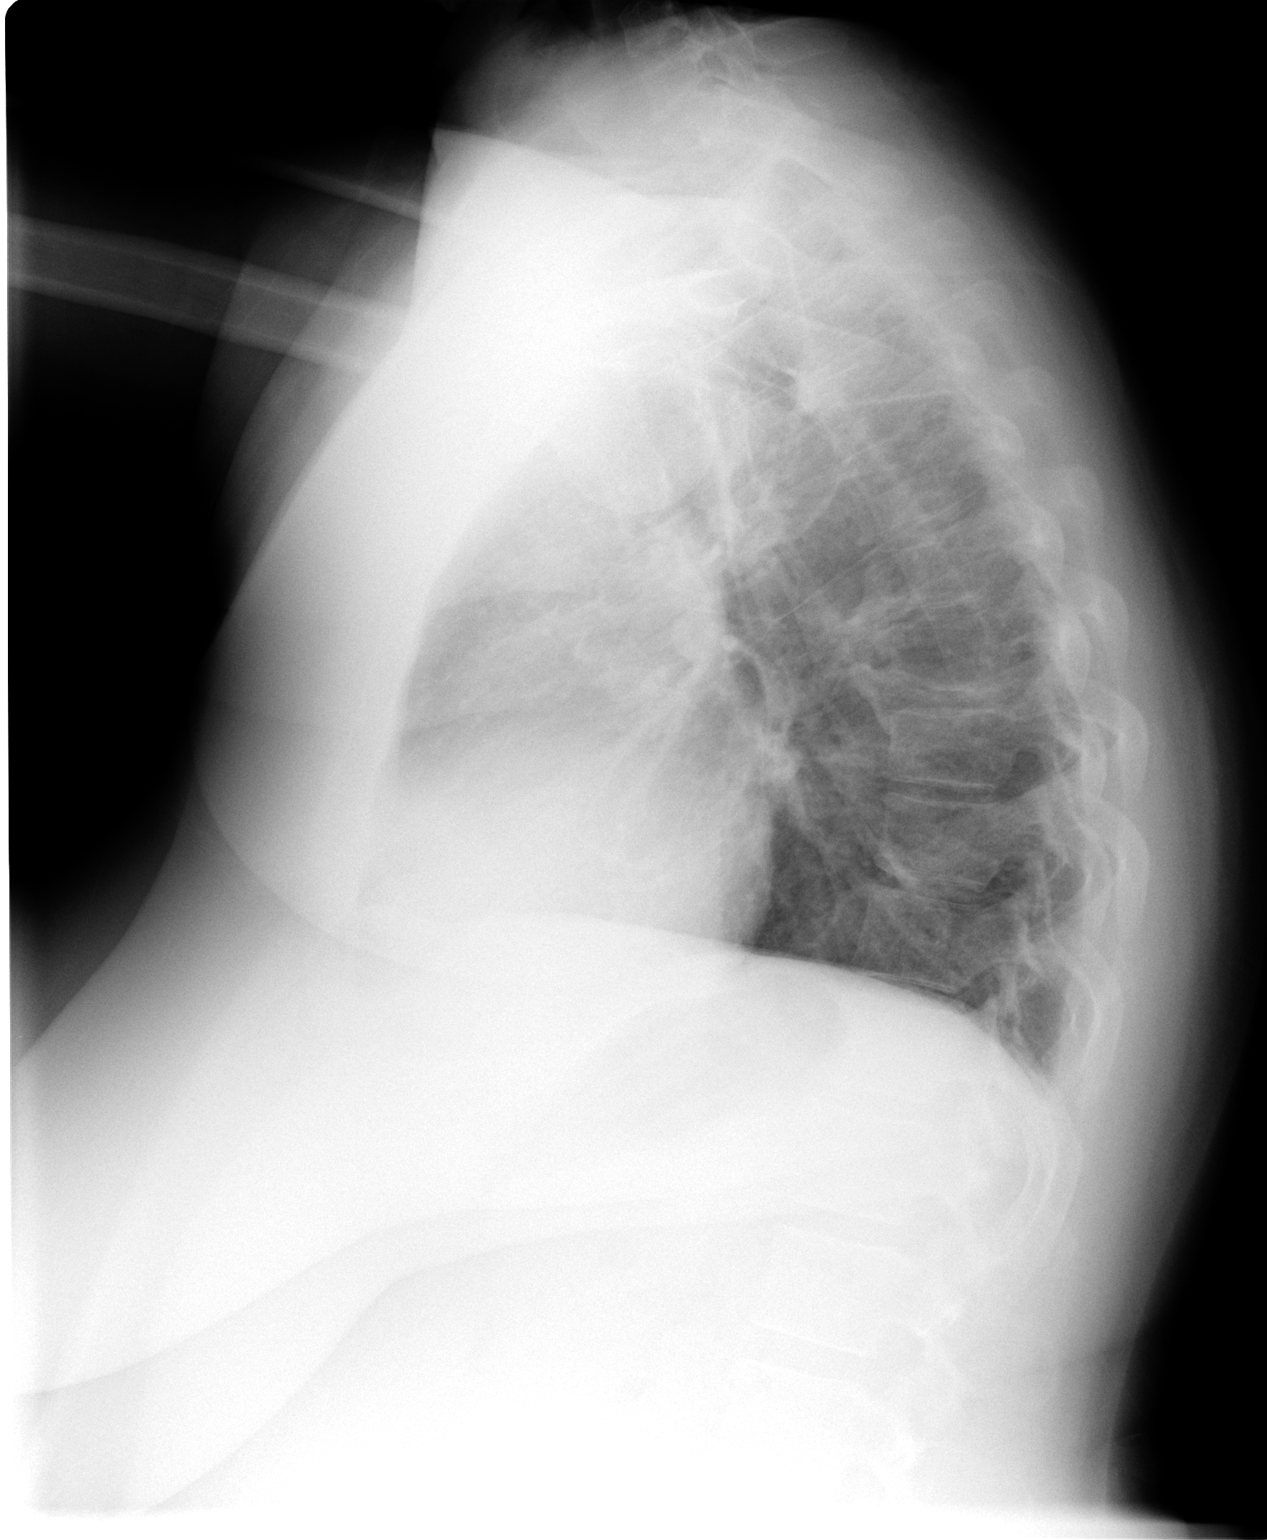

[2 of 2 positions shown; findings below may reference images not displayed]

FINDINGS: Two views of the chest show the lungs to be clear.  The heart is within upper limits of normal.  No acute bony abnormality is seen.
IMPRESSION: [place] chest x-ray.  No active lung disease.

## 2009-09-12 IMAGING — CR DG CHEST 2V
2 series · 2 of 2 positions shown · non-contrast
Comparison: 12/25/05.

CLINICAL DATA: Shortness of breath.  Wheezing.  Chest discomfort. 
CHEST - 2 VIEW:

[w chest pa]
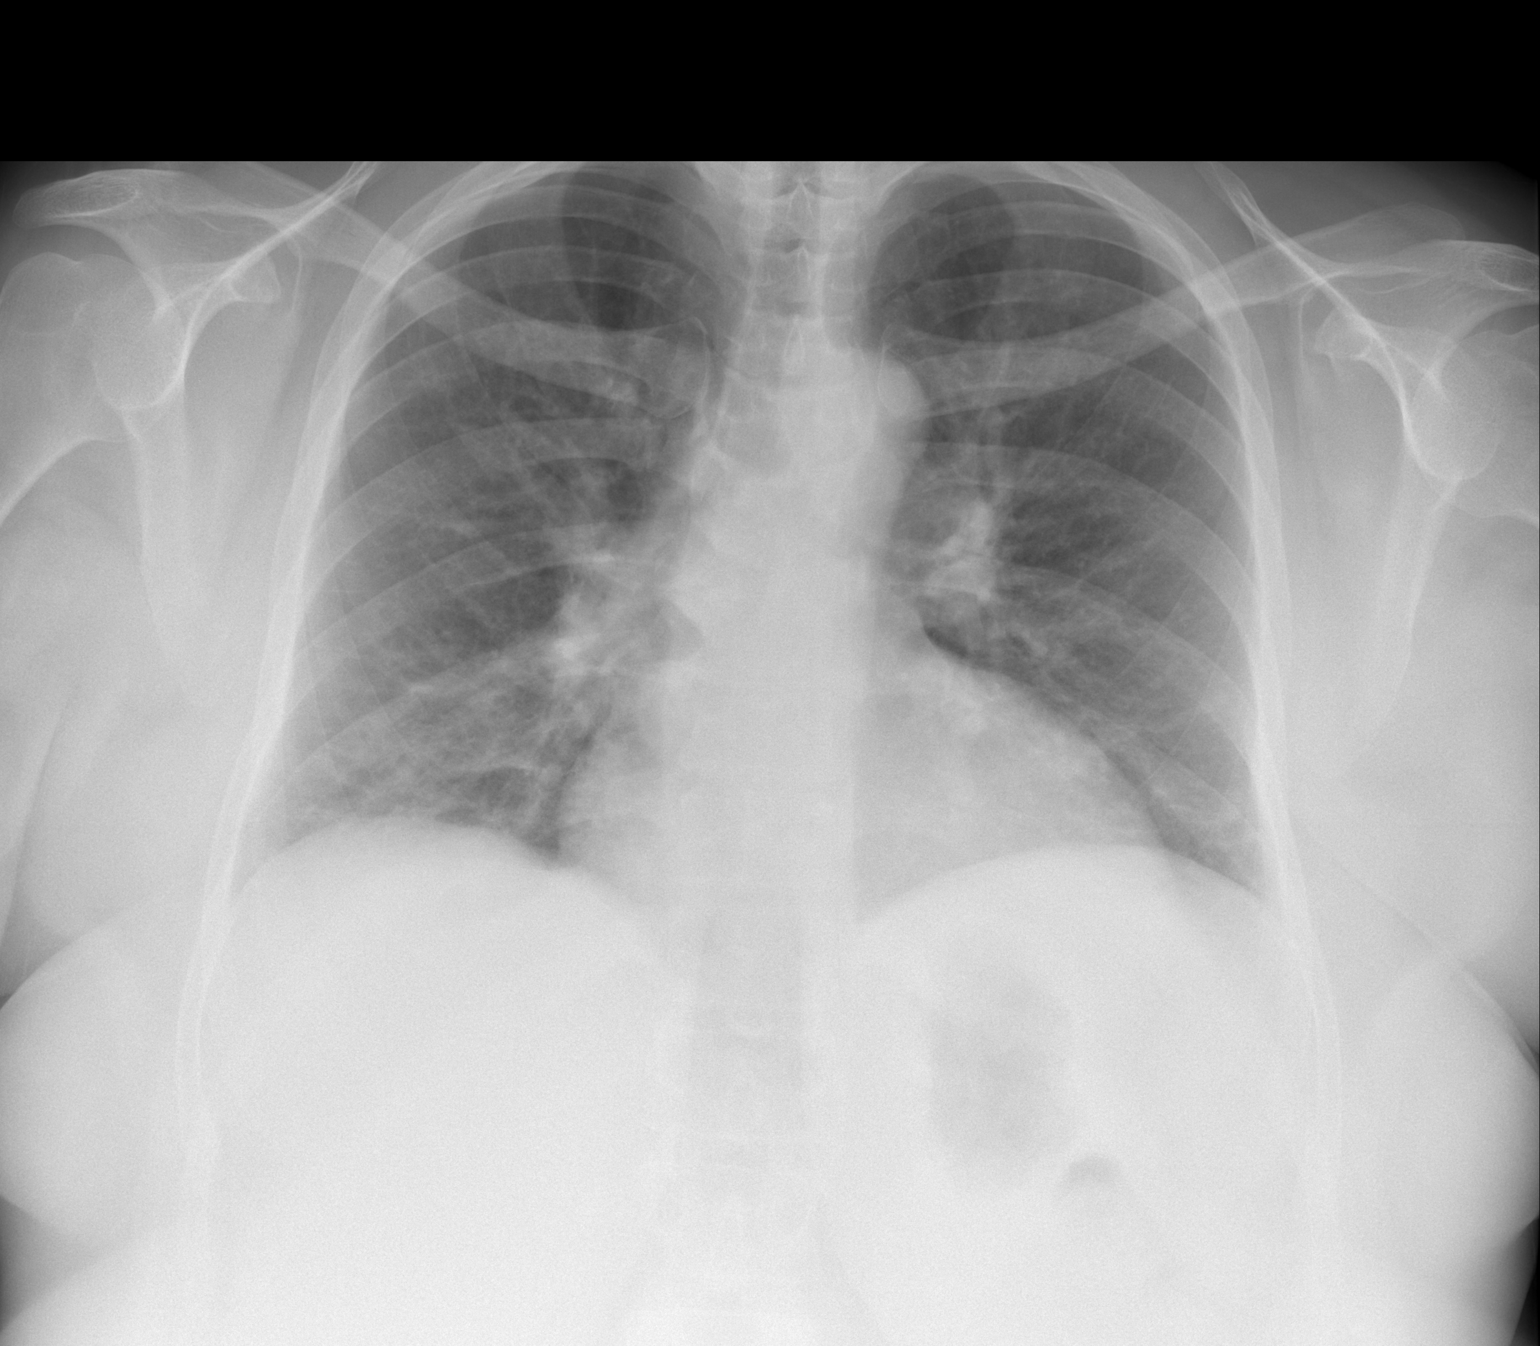

[w chest lat]
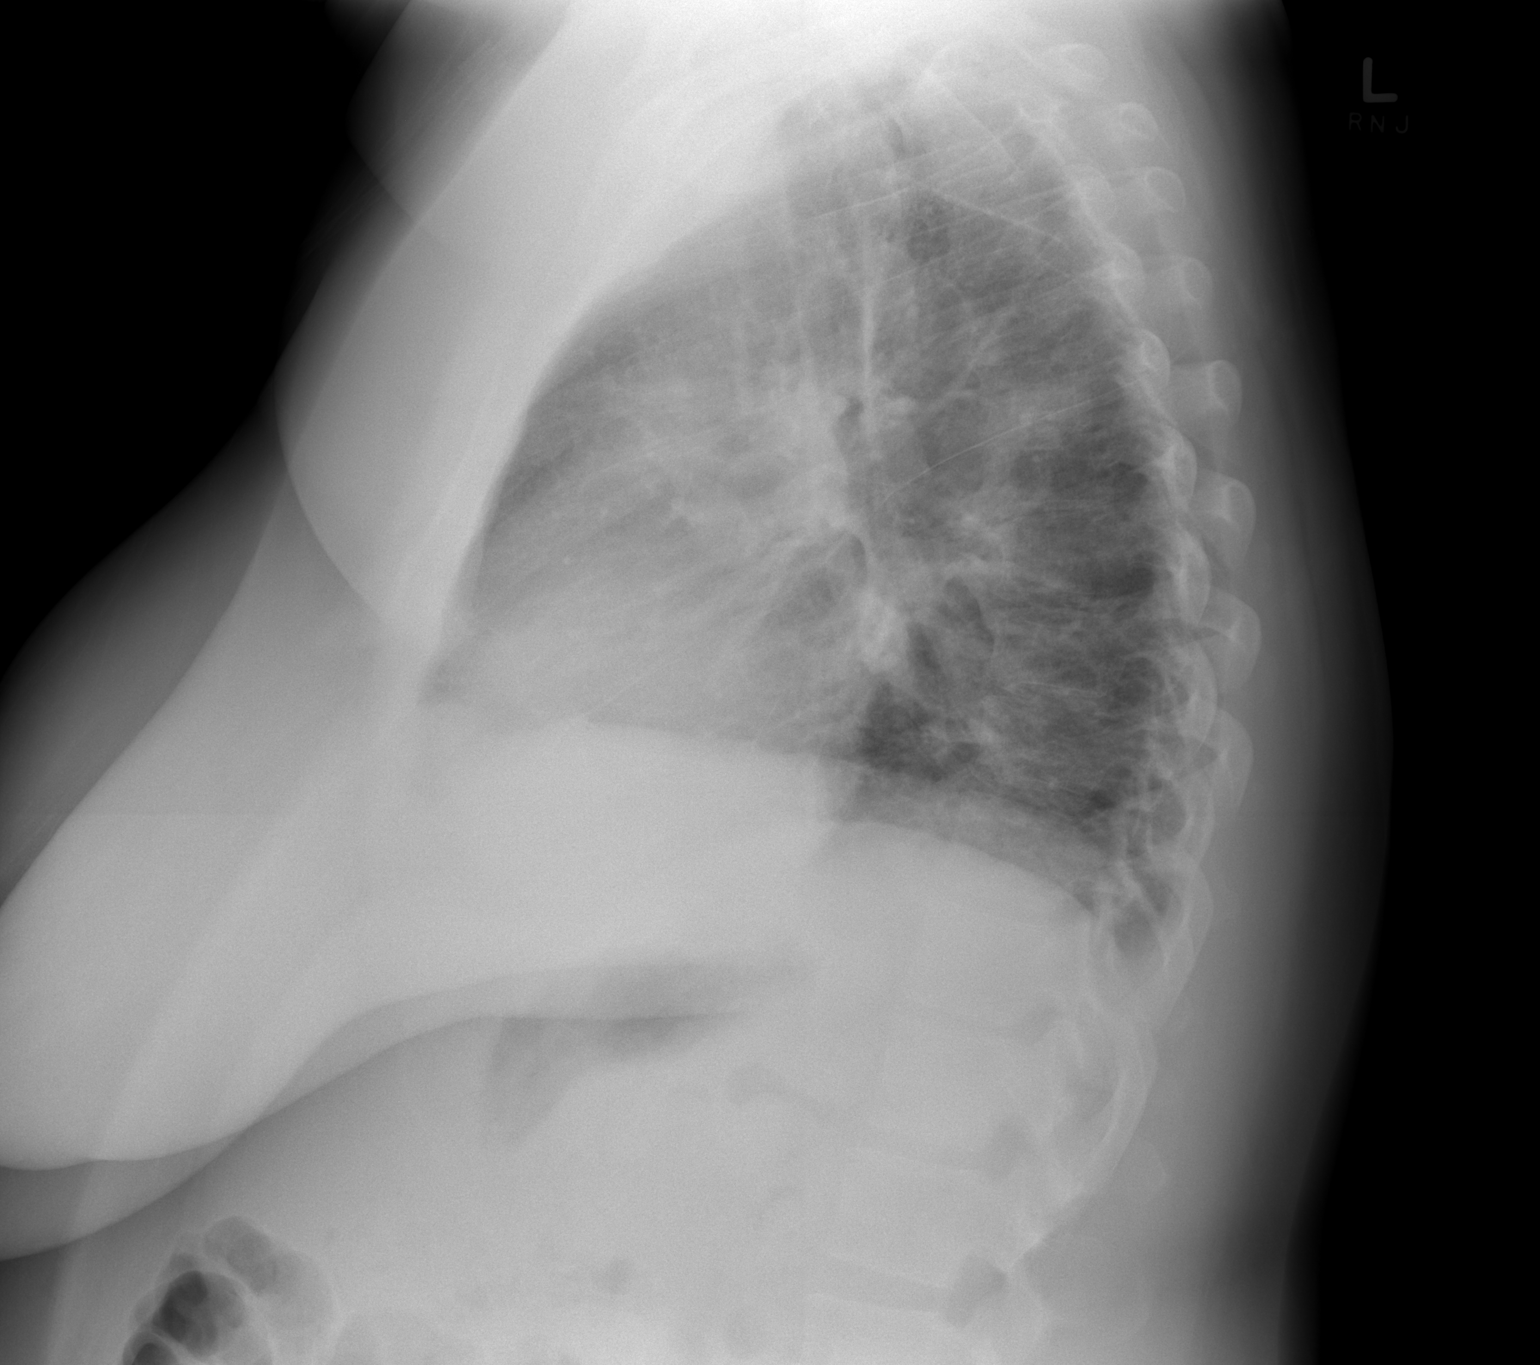

[2 of 2 positions shown; findings below may reference images not displayed]

FINDINGS: Mild opacity is seen at the right lung base which is new and may be due to infiltrate or atelectasis.  There is mild subsegmental atelectasis seen at the left lung base.  There is no evidence of pleural effusion.  Heart size and mediastinal contours are within normal limits.
IMPRESSION: 1.  Mild atelectasis vs early infiltrate in right lung base.  
2.  Mild left basilar atelectasis.
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
## 2010-01-30 ENCOUNTER — Emergency Department (HOSPITAL_COMMUNITY): Admission: EM | Admit: 2010-01-30 | Discharge: 2010-01-30 | Payer: Self-pay | Admitting: Emergency Medicine

## 2010-02-12 ENCOUNTER — Emergency Department (HOSPITAL_COMMUNITY): Admission: EM | Admit: 2010-02-12 | Discharge: 2010-02-13 | Payer: Self-pay | Admitting: Emergency Medicine

## 2010-05-01 ENCOUNTER — Encounter: Payer: Self-pay | Admitting: Internal Medicine

## 2010-05-02 ENCOUNTER — Encounter: Payer: Self-pay | Admitting: Obstetrics and Gynecology

## 2010-06-01 IMAGING — CR DG CHEST 2V
2 series · 2 of 2 positions shown · non-contrast
Comparison: 05/06/1998

CLINICAL DATA: Dyspnea, respiratory difficulty, cough, hypertension

CHEST - 2 VIEW

[w chest pa]
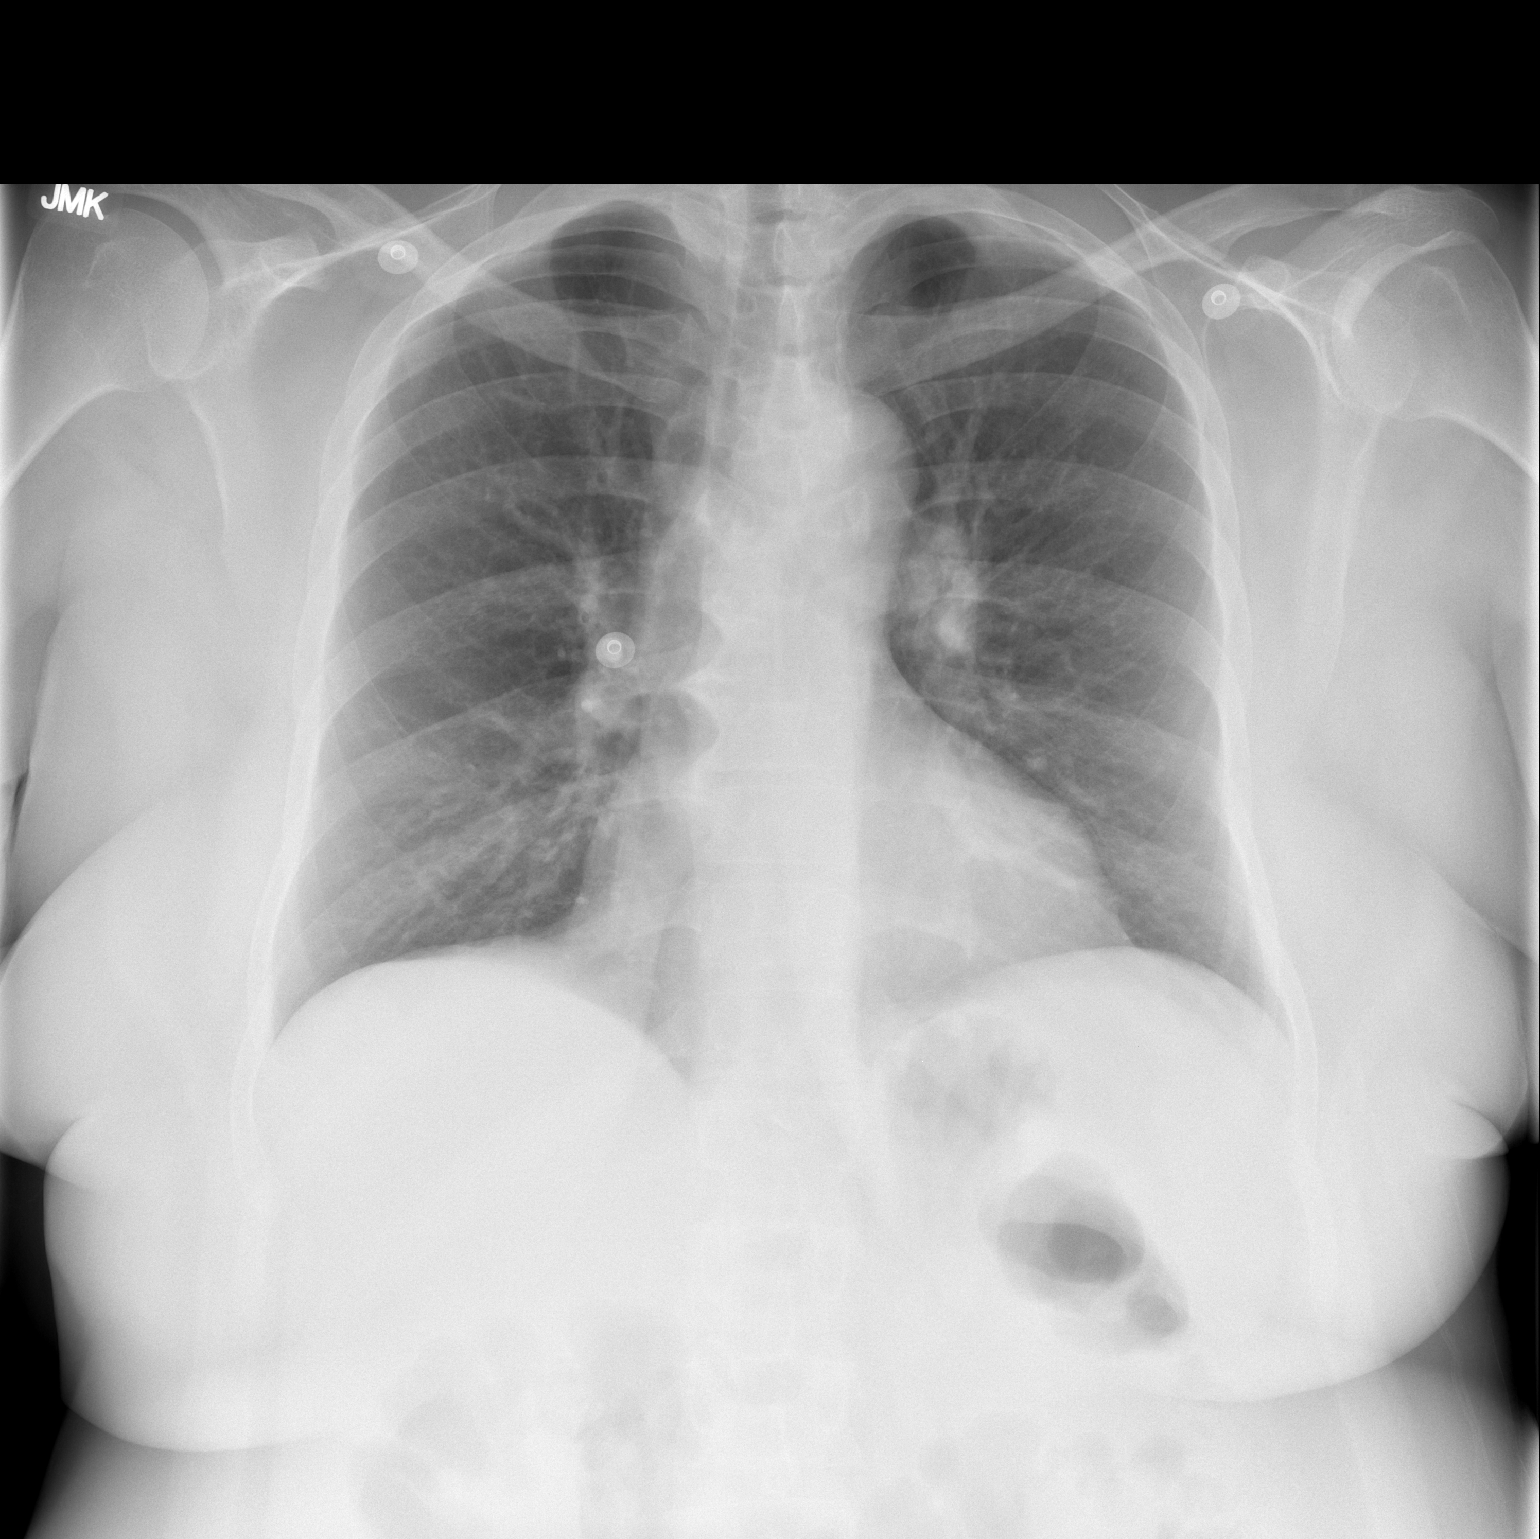

[w chest lat]
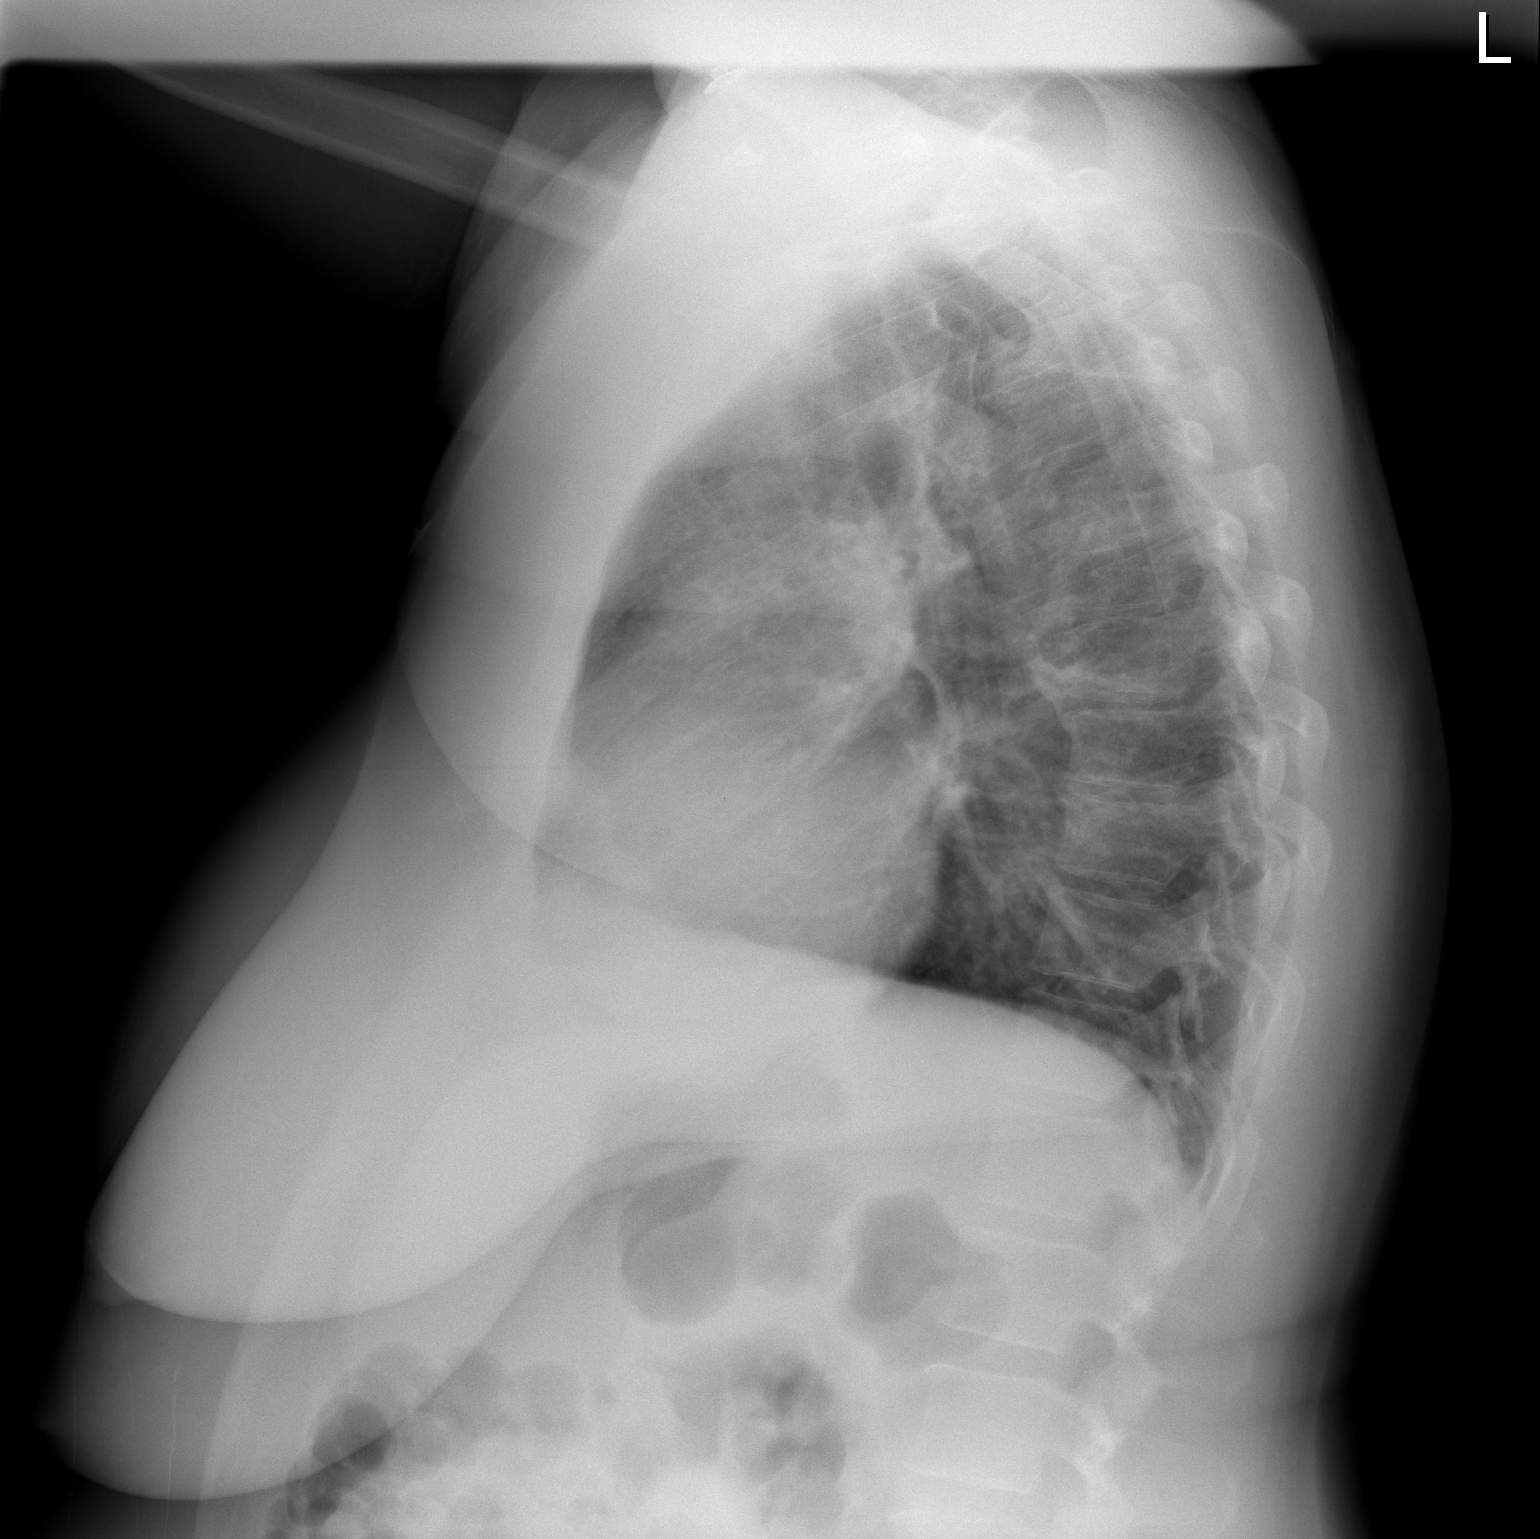

[2 of 2 positions shown; findings below may reference images not displayed]

FINDINGS: Upper normal heart size.
Normal mediastinal contours and pulmonary vascularity.
Lungs clear.
Spur formation thoracic spine.
IMPRESSION: No acute abnormalities.

## 2010-06-05 ENCOUNTER — Inpatient Hospital Stay (INDEPENDENT_AMBULATORY_CARE_PROVIDER_SITE_OTHER)
Admission: RE | Admit: 2010-06-05 | Discharge: 2010-06-05 | Disposition: A | Discharge status: Home / Self Care | Payer: Self-pay | Source: Ambulatory Visit | Attending: Emergency Medicine | Admitting: Emergency Medicine

## 2010-06-05 ENCOUNTER — Ambulatory Visit (INDEPENDENT_AMBULATORY_CARE_PROVIDER_SITE_OTHER): Payer: Self-pay

## 2010-06-05 DIAGNOSIS — S7000XA Contusion of unspecified hip, initial encounter: Secondary | ICD-10-CM

## 2010-06-05 DIAGNOSIS — S20229A Contusion of unspecified back wall of thorax, initial encounter: Secondary | ICD-10-CM

## 2010-06-14 ENCOUNTER — Ambulatory Visit (INDEPENDENT_AMBULATORY_CARE_PROVIDER_SITE_OTHER): Payer: Medicaid Other | Admitting: Family Medicine

## 2010-06-14 ENCOUNTER — Encounter: Payer: Self-pay | Admitting: Family Medicine

## 2010-06-14 DIAGNOSIS — M545 Low back pain, unspecified: Secondary | ICD-10-CM

## 2010-06-14 DIAGNOSIS — M25559 Pain in unspecified hip: Secondary | ICD-10-CM

## 2010-06-15 ENCOUNTER — Other Ambulatory Visit: Payer: Self-pay | Admitting: Family Medicine

## 2010-06-15 ENCOUNTER — Ambulatory Visit (HOSPITAL_COMMUNITY)
Admission: RE | Admit: 2010-06-15 | Discharge: 2010-06-15 | Disposition: A | Discharge status: Home / Self Care | Payer: Self-pay | Source: Ambulatory Visit | Attending: Family Medicine | Admitting: Family Medicine

## 2010-06-15 DIAGNOSIS — R52 Pain, unspecified: Secondary | ICD-10-CM

## 2010-06-15 DIAGNOSIS — M25559 Pain in unspecified hip: Secondary | ICD-10-CM | POA: Insufficient documentation

## 2010-06-21 ENCOUNTER — Other Ambulatory Visit: Payer: Self-pay | Admitting: Family Medicine

## 2010-06-21 DIAGNOSIS — M545 Low back pain, unspecified: Secondary | ICD-10-CM

## 2010-06-21 NOTE — Assessment & Plan Note (Signed)
Summary: BACK/HIP PAIN DUE TO A FALL,MC   Vital Signs:  Patient profile:   50 year old female Height:      65 inches Weight:      232 pounds BMI:     38.75 BP sitting:   134 / 78  Vitals Entered By: Rochele Pages RN (June 14, 2010 4:44 PM)  History of Present Illness: pleasant 50 year old female who presents for evaluation of LEFT hip and back pain  that occurred after an incident  per her report on June 07, 2010. She describes is to wear she fell at [employer] over some soda cans in the aisle, and subsequently  hurt her hip and back.  She doesn't recall the exact details regarding the point of impact, however at this point she does have significant LEFT lower buttocks and outside  pelvic pain as well as LEFT lower back pain.  She was initially seen at urgent care at the date of injury. At that time lumbar spine series was obtained,  and has been reviewed  by myself as well, which is grossly unremarkable and shows no evidence of occult fracture. No acute pathology.  She presents still in exquisite pain and significant moderate distress today. She was given oxycodone and a muscle relaxant,,  but is not clear of the name of the muscle relaxant she is given. At this point, she  does not feel like it is helping significantly.  She is having some difficulty walking and no gross weakness,  saddle anesthesia, or bowel or bladder incontinence.  The patient was examined with Rochele Pages, RN, in the examination room.  Past History:  Past Medical History: denies  Past Surgical History: Denies significant orthopedic history  Family History: noncontributory  Social History: currently without employment  Review of Systems      See HPI General:  Denies chills and fever. MS:  Complains of joint pain, low back pain, mid back pain, muscle aches, and stiffness. Neuro:  Denies tingling.  Physical Exam  General:  alert, well-developed, well-nourished, and well-hydrated.   Head:   normocephalic and atraumatic.   Ears:  no external deformities.   Nose:  no external deformity.   Lungs:  normal respiratory effort.   Msk:  lumbar spine examination: The patient has diffuse pain throughout her lateral  paraspinous region,  L1-S1 region as well as her entire sacrum. She is difficult to examine, and retracts seemingly due to pain.  minimal ability to flex at the waist, extend, laterally bend, or rotate.  Nontender at the greater trochanteric bursa.  Notably tender to palpation at the SI joints.  The patient walks on her toes and walks on her heels with some difficulty. She is ever able to complete this task.  Sensation is intact throughout her entire lower  extremity.  negative seated straight leg raise and negative supine straight leg raise.  Full range of motion at the hip in seated position.  Strength testing is intact throughout the entire lower extremity with 5/5 strength.  No clonus.  Neurovascularly intact throughout L4 is through S1 is intact. Extremities:  No clubbing, cyanosis, edema, or deformity noted with normal full range of motion of all joints.     Impression & Recommendations:  Problem # 1:  BACK PAIN, LUMBAR (ICD-724.2) Assessment New  the patient is in significant amount of pain  clinically  on examination,, and she is difficult to examine  based on this degree of pain. At this point, cannot rule out  occult bony injury not seen on plain x-rays.  Nerve entrapment, foraminal encroachment upon nerve,  or ooccult spinal cord  compression  cannot be ruled out, and I believe that further investigation is warranted in this case.  She also has significant pain in and around the sacral region over and above  what I would expect from a simple fall. Sacral fracture cannot be ruled out. I will obtain an MRI of the lumbar spine without contrast to include  the sacrum in images to evaluate for occult pathology as above.  Her updated medication list for this  problem includes:    Hydrocodone-acetaminophen 5-500 Mg Tabs (Hydrocodone-acetaminophen) .Marland Kitchen... 1 - 2 by mouth q 6 hours as needed severe pain    Diclofenac Sodium 75 Mg Tbec (Diclofenac sodium) .Marland Kitchen... 1 by mouth two times a day    Tizanidine Hcl 4 Mg Tabs (Tizanidine hcl) .Marland Kitchen... 1 by mouth at bedtime as needed muscle spasm  Orders: MRI without Contrast (MRI w/o Contrast)  Problem # 2:  HIP PAIN, LEFT (ICD-719.45) Assessment: New Xray, hip series Indication: pain Findings: No occult fracture or dislocation. No signficant osteoarthritic changes.   Her updated medication list for this problem includes:    Hydrocodone-acetaminophen 5-500 Mg Tabs (Hydrocodone-acetaminophen) .Marland Kitchen... 1 - 2 by mouth q 6 hours as needed severe pain    Diclofenac Sodium 75 Mg Tbec (Diclofenac sodium) .Marland Kitchen... 1 by mouth two times a day    Tizanidine Hcl 4 Mg Tabs (Tizanidine hcl) .Marland Kitchen... 1 by mouth at bedtime as needed muscle spasm  Orders: Radiology other (Radiology Other) MRI without Contrast (MRI w/o Contrast)  Complete Medication List: 1)  Hydrocodone-acetaminophen 5-500 Mg Tabs (Hydrocodone-acetaminophen) .Marland Kitchen.. 1 - 2 by mouth q 6 hours as needed severe pain 2)  Diclofenac Sodium 75 Mg Tbec (Diclofenac sodium) .Marland Kitchen.. 1 by mouth two times a day 3)  Tizanidine Hcl 4 Mg Tabs (Tizanidine hcl) .Marland Kitchen.. 1 by mouth at bedtime as needed muscle spasm  Patient Instructions: 1)  GET HIP XRAY THEN AWAIT CALL AND INSTRUCTIONS Prescriptions: TIZANIDINE HCL 4 MG TABS (TIZANIDINE HCL) 1 by mouth at bedtime as needed muscle spasm  #30 x 1   Entered and Authorized by:   Hannah Beat MD   Signed by:   Hannah Beat MD on 06/14/2010   Method used:   Print then Give to Patient   RxID:   [id number] DICLOFENAC SODIUM 75 MG TBEC (DICLOFENAC SODIUM) 1 by mouth two times a day  #60 x 2   Entered and Authorized by:   Hannah Beat MD   Signed by:   Hannah Beat MD on 06/14/2010   Method used:   Print then Give to Patient    RxID:   [id number] HYDROCODONE-ACETAMINOPHEN 5-500 MG TABS (HYDROCODONE-ACETAMINOPHEN) 1 - 2 by mouth q 6 hours as needed severe pain  #50 x 0   Entered and Authorized by:   Hannah Beat MD   Signed by:   Hannah Beat MD on 06/14/2010   Method used:   Print then Give to Patient   RxID:   [id number]    Orders Added: 1)  Radiology other [Radiology Other] 2)  New Patient Level IV [01027] 3)  MRI without Contrast [MRI w/o Contrast]

## 2010-06-27 ENCOUNTER — Inpatient Hospital Stay (HOSPITAL_COMMUNITY): Admission: RE | Admit: 2010-06-27 | Payer: Self-pay | Source: Ambulatory Visit

## 2010-07-01 ENCOUNTER — Other Ambulatory Visit (HOSPITAL_COMMUNITY): Payer: Self-pay

## 2010-07-01 ENCOUNTER — Other Ambulatory Visit: Payer: Self-pay | Admitting: Family Medicine

## 2010-07-01 ENCOUNTER — Ambulatory Visit (HOSPITAL_COMMUNITY)
Admission: RE | Admit: 2010-07-01 | Discharge: 2010-07-01 | Disposition: A | Discharge status: Home / Self Care | Payer: No Typology Code available for payment source | Source: Ambulatory Visit | Attending: Family Medicine | Admitting: Family Medicine

## 2010-07-01 DIAGNOSIS — M48061 Spinal stenosis, lumbar region without neurogenic claudication: Secondary | ICD-10-CM | POA: Insufficient documentation

## 2010-07-01 DIAGNOSIS — N83209 Unspecified ovarian cyst, unspecified side: Secondary | ICD-10-CM | POA: Insufficient documentation

## 2010-07-01 DIAGNOSIS — M545 Low back pain, unspecified: Secondary | ICD-10-CM

## 2010-07-04 ENCOUNTER — Telehealth: Payer: Self-pay | Admitting: *Deleted

## 2010-07-04 DIAGNOSIS — M545 Low back pain, unspecified: Secondary | ICD-10-CM

## 2010-07-04 MED ORDER — PREDNISONE 10 MG PO TABS: ORAL_TABLET | ORAL | Status: DC

## 2010-07-04 NOTE — Telephone Encounter (Signed)
Advised pt of the message below from Dr. Patsy Lager- she does have questions and wants to be seen tomorrow.  Called in Prednisone taper for pt to pick up at [facility] on [address].

## 2010-07-04 NOTE — Telephone Encounter (Signed)
Message copied by Resa Miner on Mon Jul 04, 2010  2:32 PM ------      Message from: Hannah Beat      Created: Mon Jul 04, 2010 11:22 AM       Annya Lizana            Please call, patient's MRI's have returned. Clearly no fracture      Mild disk bulging that likely do not relate to her pain.            I understand she is in a lot of pain.       Rec: starting a prednisone taper, then f/u in 1-2 weeks.      If she is doing poorly, has multiple questions or does not understand, I can work her in tomorrow if necessary.             Prednisone 10 mg      4 tabs by mouth for 6 days, then 3 tabs by mouth for 4 days, then 2 tabs by mouth for 4 days, then 1 tab by mouth for 4 days       #48, 0 refills

## 2010-07-05 ENCOUNTER — Ambulatory Visit (INDEPENDENT_AMBULATORY_CARE_PROVIDER_SITE_OTHER): Payer: Medicaid Other | Admitting: Family Medicine

## 2010-07-05 ENCOUNTER — Encounter: Payer: Self-pay | Admitting: Family Medicine

## 2010-07-05 DIAGNOSIS — M545 Low back pain, unspecified: Secondary | ICD-10-CM

## 2010-07-05 DIAGNOSIS — M25559 Pain in unspecified hip: Secondary | ICD-10-CM

## 2010-07-05 MED ORDER — HYDROCODONE-ACETAMINOPHEN 5-500 MG PO TABS: 1.0000 | ORAL_TABLET | Freq: Four times a day (QID) | ORAL | Status: AC | PRN

## 2010-07-05 NOTE — Assessment & Plan Note (Addendum)
MRI's reviewed  >25 minutes spent in face to face time with patient, >50% spent in counselling or coordination of care  Reassuring no NSG issue Suspect all evolving and improving soft tissue and mechanical back pain. Continue with conservative algorithms. Seems improved compared to prior eval. PT to assist with mobility and HEP.

## 2010-07-05 NOTE — Patient Instructions (Signed)
GO TO PT KEEP TAKING DICLOFENAC TWICE A DAY TIZANIDINE AT NIGHT  PAIN MEDICINE AS NEEDED

## 2010-07-05 NOTE — Progress Notes (Signed)
50 yo F here in f/u, L hip and back pain, doing better after fall per below 06/07/2010. Still in a great deal of pain, but improved motion, walking better. Occaisionally using a cane. She did have a R calf cramp over the weekend.   Continues to denies red flags, weakness, anesthesia, radiculopathy, incontinence.   MRI of the L spine and sacrum reviewed with patient. Mild L4-5 and L5-S1 disk herniation, but no nerve or spinal encroachment. No T2 signal in bone.  REVIEW OF SYSTEMS  GEN: No fevers, chills. Nontoxic. Primarily MSK c/o today. MSK: Detailed in the HPI GI: tolerating PO intake without difficulty Neuro: No numbness, parasthesias, or tingling associated. Otherwise the pertinent positives of the ROS are noted above.    06/14/2010 OV pleasant 50 year old female who presents for evaluation of LEFT hip and back pain that occurred after an incident per her report on June 07, 2010. She describes is to wear she fell at [employer] over some soda cans in the aisle, and subsequently hurt her hip and back. She doesn't recall the exact details regarding the point of impact, however at this point she does have significant LEFT lower buttocks and outside pelvic pain as well as LEFT lower back pain.  She was initially seen at urgent care at the date of injury. At that time lumbar spine series was obtained, and has been reviewed by myself as well, which is grossly unremarkable and shows no evidence of occult fracture. No acute pathology.  She presents still in exquisite pain and significant moderate distress today. She was given oxycodone and a muscle relaxant,, but is not clear of the name of the muscle relaxant she is given. At this point, she does not feel like it is helping significantly.  She is having some difficulty walking and no gross weakness, saddle anesthesia, or bowel or bladder incontinence.  Gen: Well-developed,well-nourished,in no acute distress; alert,appropriate and cooperative  throughout examination HEENT: Normocephalic and atraumatic without obvious abnormalities.  Ears, externally no deformities Pulm: Breathing comfortably in no respiratory distress Range of motion at  the waist: effectively normal Flexion: mild pain Extension: mild pain Rotation: mild pain  No echymosis or edema Rises to examination table with mild difficulty Gait: minimally antalgic  Inspection/Deformity: N Paraspinus T: Y - NT at midspine, but L buttocks and L paraspinus TTP, improved compared to last exam  B Ankle Dorsiflexion (L5,4): 5/5 B Great Toe Dorsiflexion (L5,4): 5/5 Heel Walk (L5): WNL Toe Walk (S1): WNL Rise/Squat (L4): WNL, mild pain  SENSORY B Medial Foot (L4): WNL B Dorsum (L5): WNL B Lateral (S1): WNL Light Touch: WNL  B SLR, seated: neg B SLR, supine: neg B Greater Troch: NT B Log Roll: neg B Stork: NT B Sciatic Notch: TTP L

## 2010-07-14 HISTORY — PX: TOTAL ABDOMINAL HYSTERECTOMY: SHX209

## 2010-07-18 ENCOUNTER — Ambulatory Visit: Payer: No Typology Code available for payment source | Attending: Family Medicine

## 2010-07-18 DIAGNOSIS — IMO0001 Reserved for inherently not codable concepts without codable children: Secondary | ICD-10-CM | POA: Insufficient documentation

## 2010-07-18 DIAGNOSIS — R5381 Other malaise: Secondary | ICD-10-CM | POA: Insufficient documentation

## 2010-07-18 DIAGNOSIS — M2560 Stiffness of unspecified joint, not elsewhere classified: Secondary | ICD-10-CM | POA: Insufficient documentation

## 2010-07-18 DIAGNOSIS — M545 Low back pain, unspecified: Secondary | ICD-10-CM | POA: Insufficient documentation

## 2010-07-18 DIAGNOSIS — M25559 Pain in unspecified hip: Secondary | ICD-10-CM | POA: Insufficient documentation

## 2010-07-20 LAB — COMPREHENSIVE METABOLIC PANEL
ALT: 13 U/L (ref 0–35)
ALT: 8 U/L (ref 0–35)
AST: 12 U/L (ref 0–37)
AST: 13 U/L (ref 0–37)
Albumin: 2.9 g/dL — ABNORMAL LOW (ref 3.5–5.2)
CO2: 27 mEq/L (ref 19–32)
Calcium: 8.9 mg/dL (ref 8.4–10.5)
Calcium: 9.3 mg/dL (ref 8.4–10.5)
Creatinine, Ser: 0.51 mg/dL (ref 0.4–1.2)
GFR calc Af Amer: >60 mL/min (ref >60)
GFR calc Af Amer: >60 mL/min (ref >60)
GFR calc non Af Amer: >60 mL/min (ref >60)
Glucose, Bld: 113 mg/dL — ABNORMAL HIGH (ref 70–99)
Sodium: 136 mEq/L (ref 135–145)
Sodium: 137 mEq/L (ref 135–145)
Total Protein: 6.1 g/dL (ref 6.0–8.3)
Total Protein: 7.2 g/dL (ref 6.0–8.3)

## 2010-07-20 LAB — DIFFERENTIAL
Basophils Relative: 1 % (ref 0–1)
Basophils Relative: 1 % (ref 0–1)
Basophils Relative: 1 % (ref 0–1)
Eosinophils Absolute: 0.4 10*3/uL (ref 0.0–0.7)
Eosinophils Absolute: 0.4 10*3/uL (ref 0.0–0.7)
Eosinophils Absolute: 0.1 10*3/uL (ref 0.0–0.7)
Eosinophils Relative: 2 % (ref 0–5)
Lymphocytes Relative: 10 % — ABNORMAL LOW (ref 12–46)
Lymphs Abs: 2.8 10*3/uL (ref 0.7–4.0)
Lymphs Abs: 2.9 10*3/uL (ref 0.7–4.0)
Lymphs Abs: 2.4 10*3/uL (ref 0.7–4.0)
Lymphs Abs: 2.3 10*3/uL (ref 0.7–4.0)
Monocytes Absolute: 0.8 10*3/uL (ref 0.1–1.0)
Monocytes Absolute: 0.9 10*3/uL (ref 0.1–1.0)
Monocytes Relative: 1 % — ABNORMAL LOW (ref 3–12)
Monocytes Relative: 4 % (ref 3–12)
Monocytes Relative: 5 % (ref 3–12)
Monocytes Relative: 3 % (ref 3–12)
Neutro Abs: 19.6 10*3/uL — ABNORMAL HIGH (ref 1.7–7.7)
Neutrophils Relative %: 74 % (ref 43–77)
Neutrophils Relative %: 78 % — ABNORMAL HIGH (ref 43–77)
Neutrophils Relative %: 86 % — ABNORMAL HIGH (ref 43–77)

## 2010-07-20 LAB — WOUND CULTURE: Culture: NO GROWTH

## 2010-07-20 LAB — CBC
Hemoglobin: 9.9 g/dL — ABNORMAL LOW (ref 12.0–15.0)
MCHC: 32.5 g/dL (ref 30.0–36.0)
MCHC: 33.4 g/dL (ref 30.0–36.0)
MCHC: 33.5 g/dL (ref 30.0–36.0)
MCHC: 32.9 g/dL (ref 30.0–36.0)
MCHC: 33.1 g/dL (ref 30.0–36.0)
MCV: 83.3 fL (ref 78.0–100.0)
MCV: 83.5 fL (ref 78.0–100.0)
MCV: 83.6 fL (ref 78.0–100.0)
MCV: 84.6 fL (ref 78.0–100.0)
Platelets: 446 10*3/uL — ABNORMAL HIGH (ref 150–400)
Platelets: 532 10*3/uL — ABNORMAL HIGH (ref 150–400)
Platelets: 288 10*3/uL (ref 150–400)
RBC: 3.57 MIL/uL — ABNORMAL LOW (ref 3.87–5.11)
RBC: 4.2 MIL/uL (ref 3.87–5.11)
RBC: 3.42 MIL/uL — ABNORMAL LOW (ref 3.87–5.11)
RBC: 3.72 MIL/uL — ABNORMAL LOW (ref 3.87–5.11)
RDW: 17.2 % — ABNORMAL HIGH (ref 11.5–15.5)
RDW: 17.3 % — ABNORMAL HIGH (ref 11.5–15.5)
RDW: 17.6 % — ABNORMAL HIGH (ref 11.5–15.5)
WBC: 18.4 10*3/uL — ABNORMAL HIGH (ref 4.0–10.5)
WBC: 22.8 10*3/uL — ABNORMAL HIGH (ref 4.0–10.5)

## 2010-07-20 LAB — ANAEROBIC CULTURE

## 2010-07-21 LAB — COMPREHENSIVE METABOLIC PANEL
ALT: 12 U/L (ref 0–35)
CO2: 22 mEq/L (ref 19–32)
Calcium: 10.1 mg/dL (ref 8.4–10.5)
Creatinine, Ser: 0.84 mg/dL (ref 0.4–1.2)
GFR calc non Af Amer: >60 mL/min (ref >60)
Glucose, Bld: 105 mg/dL — ABNORMAL HIGH (ref 70–99)
Total Bilirubin: 0.8 mg/dL (ref 0.3–1.2)

## 2010-07-21 LAB — CBC
Hemoglobin: 13.7 g/dL (ref 12.0–15.0)
MCHC: 32.2 g/dL (ref 30.0–36.0)
MCV: 84 fL (ref 78.0–100.0)
RBC: 5.06 MIL/uL (ref 3.87–5.11)

## 2010-07-21 LAB — HCG, SERUM, QUALITATIVE: Preg, Serum: NEGATIVE

## 2010-07-25 LAB — POCT URINALYSIS DIP (DEVICE)
Glucose, UA: NEGATIVE mg/dL
Nitrite: NEGATIVE
Specific Gravity, Urine: 1.01 (ref 1.005–1.030)
Urobilinogen, UA: 0.2 mg/dL (ref 0.0–1.0)

## 2010-07-25 LAB — POCT I-STAT, CHEM 8
Calcium, Ion: 1.18 mmol/L (ref 1.12–1.32)
Chloride: 103 mEq/L (ref 96–112)
HCT: 41 % (ref 36.0–46.0)
Hemoglobin: 13.9 g/dL (ref 12.0–15.0)

## 2010-07-26 ENCOUNTER — Ambulatory Visit: Payer: No Typology Code available for payment source

## 2010-07-28 ENCOUNTER — Ambulatory Visit: Payer: No Typology Code available for payment source

## 2010-08-02 ENCOUNTER — Ambulatory Visit (INDEPENDENT_AMBULATORY_CARE_PROVIDER_SITE_OTHER): Payer: Medicaid Other | Admitting: Family Medicine

## 2010-08-02 ENCOUNTER — Ambulatory Visit: Payer: No Typology Code available for payment source

## 2010-08-02 DIAGNOSIS — M25559 Pain in unspecified hip: Secondary | ICD-10-CM

## 2010-08-02 DIAGNOSIS — M545 Low back pain, unspecified: Secondary | ICD-10-CM

## 2010-08-02 MED ORDER — HYDROCODONE-ACETAMINOPHEN 7.5-500 MG PO TABS: 1.0000 | ORAL_TABLET | Freq: Four times a day (QID) | ORAL | Status: AC | PRN

## 2010-08-02 NOTE — Patient Instructions (Signed)
Check out the "right ovarian cyst" -- follow up with Gynecologist  RECHECK IN 6 WEEKS

## 2010-08-03 ENCOUNTER — Encounter: Payer: Self-pay | Admitting: Family Medicine

## 2010-08-03 ENCOUNTER — Ambulatory Visit: Payer: No Typology Code available for payment source

## 2010-08-03 NOTE — Assessment & Plan Note (Signed)
Continues to improve. Would expect continued improvement with conservative management. No red flags on imaging, encouraged about HEP, and she will continue at her complex when not in PT. I would anticipate this to help with recovery.  Refill pain meds for now.

## 2010-08-03 NOTE — Progress Notes (Signed)
50 yo patient, 06/07/2010 DOI documented below, presents in f/u L posterior buttocks pain and LBP, continues to improved with improving ROM and functionality with continued pain with motion and activity.  Neurologically remains intact. 50 Requiring some narcotic medications. PT notes reviewed.  07/05/2010 OV: 50 yo F here in f/u, L hip and back pain, doing better after fall per below 06/07/2010. Still in a great deal of pain, but improved motion, walking better. Occaisionally using a cane. She did have a R calf cramp over the weekend.  Continues to denies red flags, weakness, anesthesia, radiculopathy, incontinence.  MRI of the L spine and sacrum reviewed with patient. Mild L4-5 and L5-S1 disk herniation, but no nerve or spinal encroachment. No T2 signal in bone.   06/14/2010 OV  pleasant 50 year old female who presents for evaluation of LEFT hip and back pain that occurred after an incident per her report on June 07, 2010. She describes is to wear she fell at [employer] over some soda cans in the aisle, and subsequently hurt her hip and back. She doesn't recall the exact details regarding the point of impact, however at this point she does have significant LEFT lower buttocks and outside pelvic pain as well as LEFT lower back pain.  She was initially seen at urgent care at the date of injury. At that time lumbar spine series was obtained, and has been reviewed by myself as well, which is grossly unremarkable and shows no evidence of occult fracture. No acute pathology.  She presents still in exquisite pain and significant moderate distress today. She was given oxycodone and a muscle relaxant,, but is not clear of the name of the muscle relaxant she is given. At this point, she does not feel like it is helping significantly.  She is having some difficulty walking and no gross weakness, saddle anesthesia, or bowel or bladder incontinence.   REVIEW OF SYSTEMS  GEN: No fevers, chills. Nontoxic.  Primarily MSK c/o today. MSK: Detailed in the HPI GI: tolerating PO intake without difficulty Neuro: No numbness, parasthesias, or tingling associated. Otherwise the pertinent positives of the ROS are noted above.    Gen: Well-developed,well-nourished,in no acute distress; alert,appropriate and cooperative throughout examination  HEENT: Normocephalic and atraumatic without obvious abnormalities. Ears, externally no deformities  Pulm: Breathing comfortably in no respiratory distress  Range of motion at the waist: effectively normal  Flexion: mild pain  Extension: mild pain  Rotation: mild pain  No echymosis or edema  Rises to examination table with minimal difficulty  Gait: minimally antalgic  Inspection/Deformity: N  Paraspinus T: Y - NT at midspine, but L buttocks and L paraspinus TTP, improved compared to last exam  B Ankle Dorsiflexion (L5,4): 5/5  B Great Toe Dorsiflexion (L5,4): 5/5  Heel Walk (L5): WNL  Toe Walk (S1): WNL  Rise/Squat (L4): WNL  SENSORY  B Medial Foot (L4): WNL  B Dorsum (L5): WNL  B Lateral (S1): WNL  Light Touch: WNL   B SLR, seated: neg  B Greater Troch: L GTB minimally TTP B Log Roll: neg  B Stork: NT  B Sciatic Notch: TTP L

## 2010-08-04 IMAGING — MG MM DIGITAL SCREENING BILAT
6 series · 6 of 6 positions shown · non-contrast
Comparison: none

DG SCREEN MAMMOGRAM BILATERAL
Bilateral CC and MLO view(s) were taken.

DIGITAL SCREENING MAMMOGRAM WITH CAD:
There are scattered fibroglandular densities.  No masses or malignant type calcifications are 
identified.  Compared with prior studies.

[R CC]
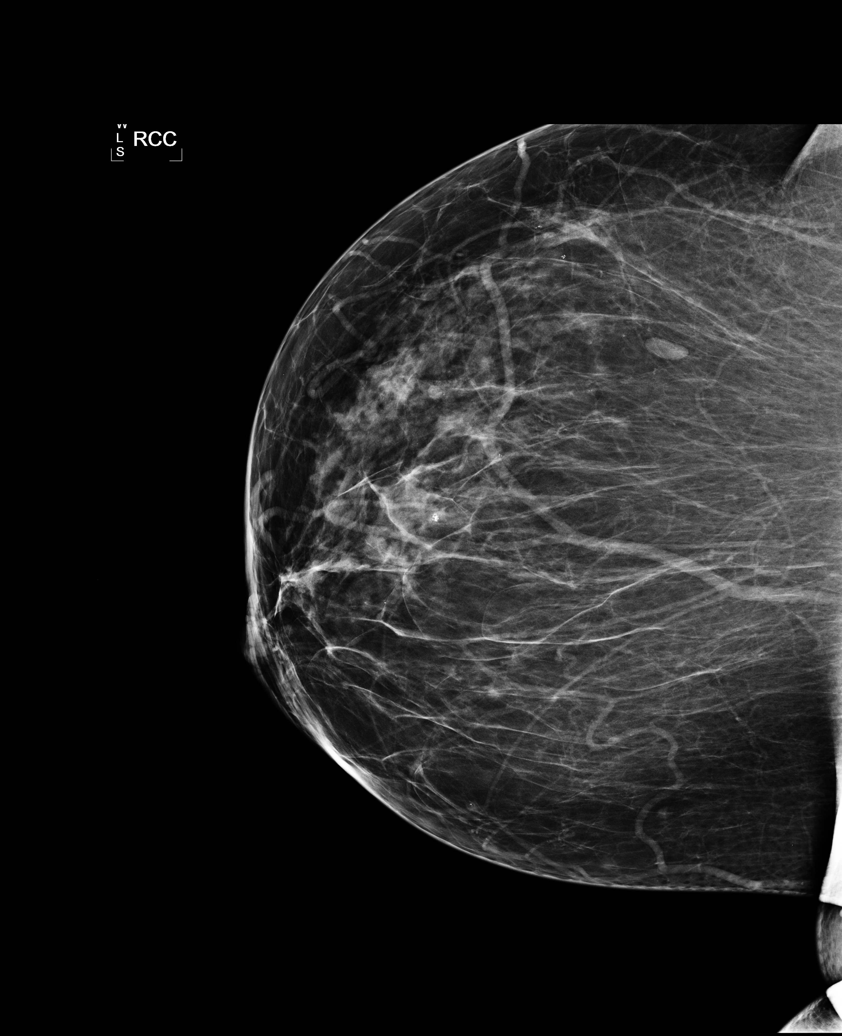

[R MLO (1 of 2)]
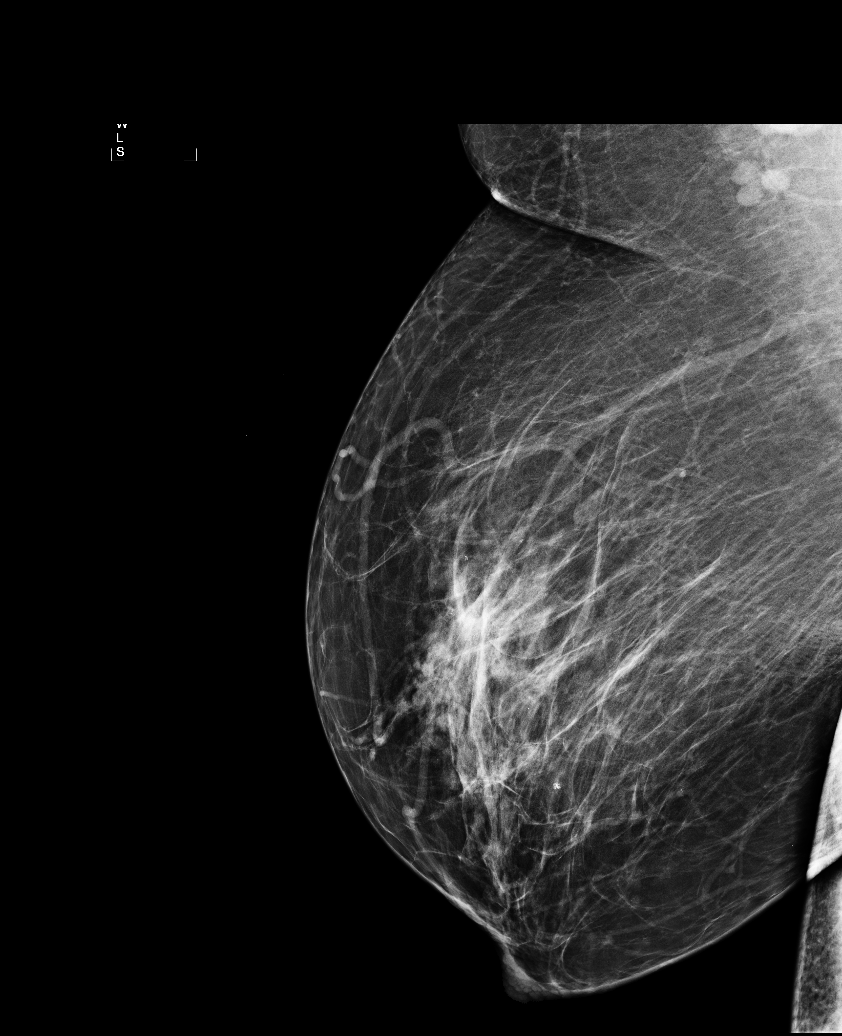

[L CC]
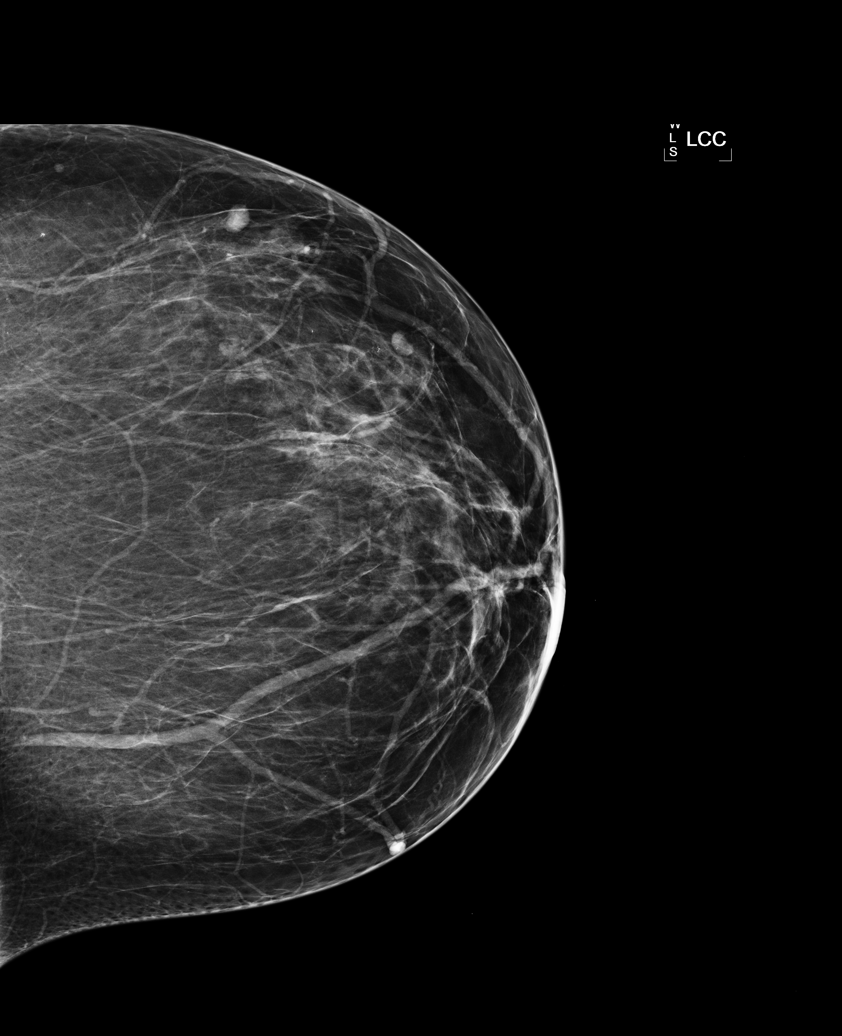

[L MLO (1 of 2)]
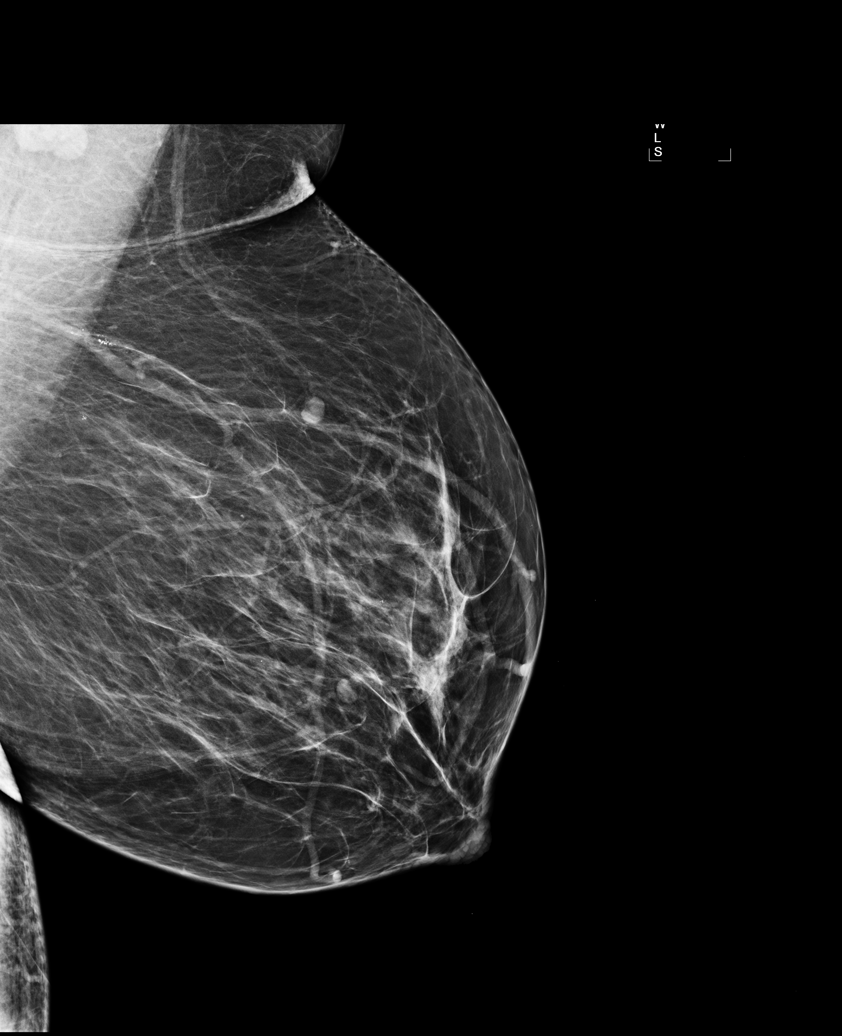

[L MLO (2 of 2)]
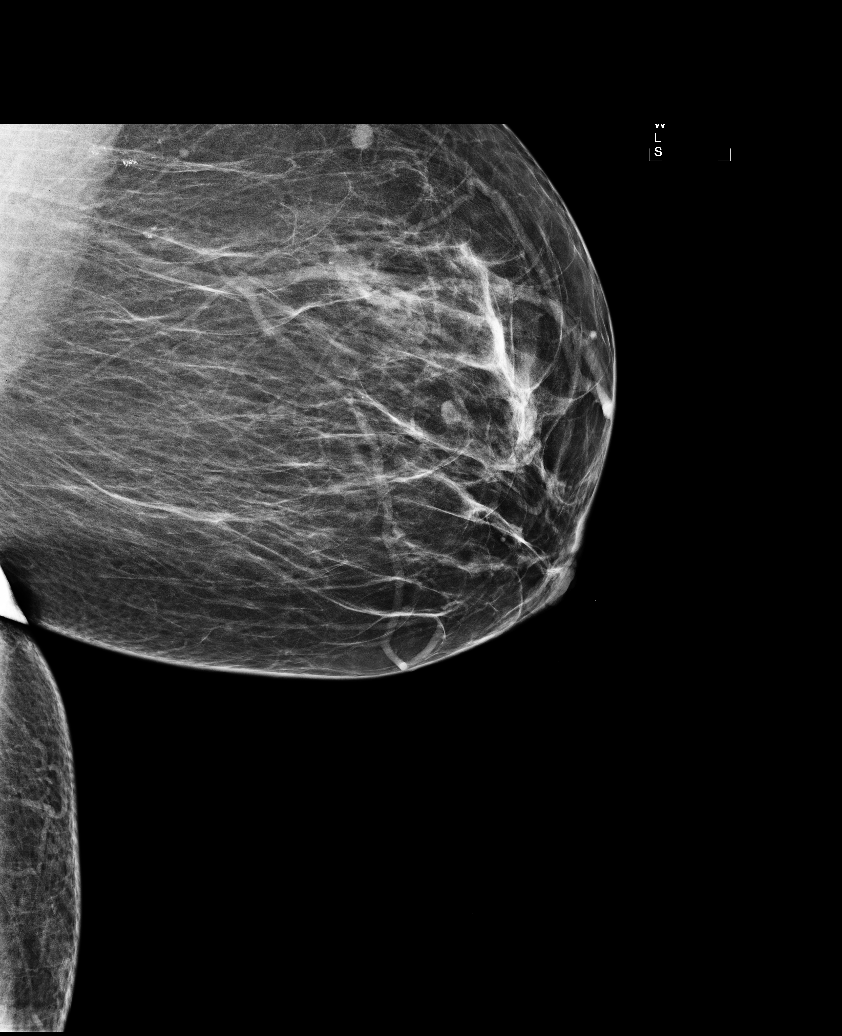

[R MLO (2 of 2)]
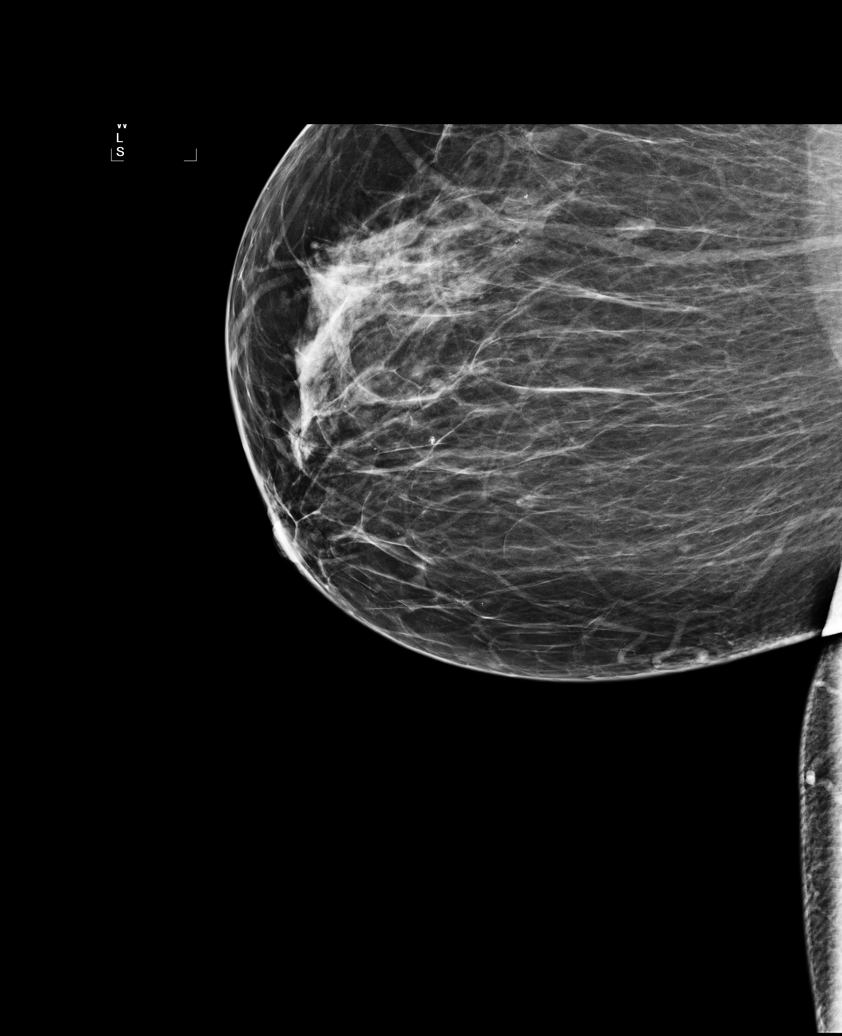

[6 of 6 positions shown; findings below may reference images not displayed]

IMPRESSION: No specific mammographic evidence of malignancy.  Next screening mammogram is recommended in one 
year.

ASSESSMENT: Negative - BI-RADS 1

Screening mammogram in 1 year.
ANALYZED BY COMPUTER AIDED DETECTION. , THIS PROCEDURE WAS A DIGITAL MAMMOGRAM.

## 2010-08-10 ENCOUNTER — Ambulatory Visit: Payer: No Typology Code available for payment source | Attending: Family Medicine

## 2010-08-10 DIAGNOSIS — IMO0001 Reserved for inherently not codable concepts without codable children: Secondary | ICD-10-CM | POA: Insufficient documentation

## 2010-08-10 DIAGNOSIS — M545 Low back pain, unspecified: Secondary | ICD-10-CM | POA: Insufficient documentation

## 2010-08-10 DIAGNOSIS — M2560 Stiffness of unspecified joint, not elsewhere classified: Secondary | ICD-10-CM | POA: Insufficient documentation

## 2010-08-10 DIAGNOSIS — R5381 Other malaise: Secondary | ICD-10-CM | POA: Insufficient documentation

## 2010-08-10 DIAGNOSIS — M25559 Pain in unspecified hip: Secondary | ICD-10-CM | POA: Insufficient documentation

## 2010-08-15 ENCOUNTER — Ambulatory Visit: Payer: No Typology Code available for payment source | Admitting: Physical Therapy

## 2010-08-19 ENCOUNTER — Ambulatory Visit: Payer: No Typology Code available for payment source

## 2010-08-22 ENCOUNTER — Ambulatory Visit: Payer: No Typology Code available for payment source

## 2010-08-23 NOTE — Op Note (Signed)
NAMESTUTI, Michele               ACCOUNT NO.:  [id number]   MEDICAL RECORD NO.:  [id number]          PATIENT TYPE:  INP   LOCATION:  9320                          FACILITY:  [facility]   PHYSICIAN:  Pieter Partridge, MD   DATE OF BIRTH:  Dec 19, 1960   DATE OF PROCEDURE:  07/27/2008  DATE OF DISCHARGE:  07/30/2008                               OPERATIVE REPORT   PREOPERATIVE DIAGNOSIS:  Suspected wound dehiscence.   POSTOPERATIVE DIAGNOSIS:  Wound dehiscence with evisceration.   PROCEDURE:  Wound exploration and fascial closure by general surgeon.   SURGEON:  Michele Nevin. Dion Body, MD.   Mammie LorenzoAdolph Pollack, MD.   ASSISTANT:  June Leap.   ANESTHESIA:  General.   FINDINGS:  Fascia completely opened, bowel herniated between the 3-4 cm  opening between the rectus muscle.  Vaginal cuff intact.   SPECIMENS:  None.   COMPLICATIONS:  None.   ESTIMATED BLOOD LOSS:  Minimal.   The patient to PACU in stable condition.   PROCEDURE IN DETAIL:  [name] was seen in the office for routine  wound check and it was suspected that there was a reducible mass that  was consistent with bowel at the incision.  She was then transferred  over to [facility] for urgent wound exploration due to suspected wound  dehiscence.   The patient underwent general endotracheal anesthesia without  complication, was then prepped and draped in normal sterile fashion in  the supine position.   Incision was returned to the wound.  There was approximately a 3-4 cm  opening of the skin.  The incision was then opened with suture scissors  and the sutures were cut in the incision.  Once the sutures were cut,  the bowel almost quite obvious at the incision.   At first, I thought the incision for the bowel had herniated through a  hole in the fascia then through exploration I realized the whole fascial  incision had dehisced.  Exploration of the bowel at the incision showed  just serosal irritation and  thickening.  There was a thickened mass that  was suspected and for that reason, the general surgeon was called in.  Prior to the arrival of the general surgeon, the bowel was run for any  injury or any issues of infection.  Wound cultures, anaerobic and  aerobic were performed.   The ovaries were evaluated, which were normal and the cuff was palpated  and that was intact.   Upon arrival of the general surgeon, please see his operative report for  the dictation of his procedure.   After the abdomen was closed, a sterile pelvic exam was performed just  to confirm that the vagina was in fact closed and the cuff was intact.  Sterile vaginal exam was performed without complication and the cuff was  definitely intact.  The patient was awakened and taken to the recovery  room in stable condition.  All instrument, sponge, and needle counts  were correct x3.      Pieter Partridge, MD  Electronically Signed     EBV/MEDQ  D:  09/02/2008  T:  09/03/2008  Job:  [id number]

## 2010-08-23 NOTE — Consult Note (Signed)
NAMELAVETTA, Michele Owens               ACCOUNT NO.:  [id number]   MEDICAL RECORD NO.:  [id number]          PATIENT TYPE:  INP   LOCATION:  9320                          FACILITY:  [facility]   PHYSICIAN:  Adolph Pollack, M.D.DATE OF BIRTH:  May 03, 1960   DATE OF CONSULTATION:  DATE OF DISCHARGE:                                 CONSULTATION   PHYSICAL REQUESTING THE CONSULTATION:  [name] B. Dion Body, MD   REASON FOR CONSULTATION:  Abdominal wound evisceration, abnormal small  bowel.   HISTORY:  This is a 50 year old female who underwent total abdominal  hysterectomy April 5.  She was having intermittent problems with wound  pain.  Dr. Dion Body seen her today and noted that she had what appeared  to be bowel extruding into the subcutaneous tissue consistent with the  dehiscence and evisceration, and brought her to the operating room.  She  noted some thickened abnormal distal small bowel as well as some  irregular fascia and has asked me to intraop consultation to assess the  small bowel as well as to help with closure.   PAST MEDICAL HISTORY:  1. Asthma.  2. Hypertension.  3. Bronchitis.  4. Obesity.   PREVIOUS OPERATIONS:  Hysterectomy.   SOCIAL HISTORY:  She smokes 1-2 packs of cigarettes a day.  Occasional  alcohol use.  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   REVIEW OF SYSTEMS:  Unobtainable at the current time.   Past medical history and social history have been obtained from old  records.   PHYSICAL EXAMINATION:  Generally, obese female supine under general  anesthesia.  There is a lower transverse incision with fascia open, but  the buried fascia is viable, but irregular in spots.  No pus.  Distal  small bowel demonstrates some thickening up to the terminal ileal area  at the ileocecal valve, but it is viable.  There are no defects or  serosal tears.   Of note, her preoperative white blood cell count was 19,500 with a  leftward shift.   Her albumin level was normal at 3.6.   IMPRESSION:  Abdominal wound evisceration in a woman who has multiple  risk factors specifically tobacco use and obesity.   PLAN:  We will perform fascial closure with interrupted nonabsorbable  sutures.  I talked with Dr. Dion Body about close wound observation and is  being able to close the skin with staples.  If there is a least bit  evidence of wound infection, I would open the wound up, start wet-to-dry  dressing changes, and place an abdominal VAC on.  Would keep her on  empiric antibiotics and go slow with her diet.  Would also use an  abdominal binder.      Adolph Pollack, M.D.  Electronically Signed     TJR/MEDQ  D:  07/27/2008  T:  07/28/2008  Job:  [id number]   cc:   Pieter Partridge, MD  Fax: [phone]

## 2010-08-23 NOTE — Op Note (Signed)
NAMEMARICRUZ, Michele Owens               ACCOUNT NO.:  [id number]   MEDICAL RECORD NO.:  [id number]          PATIENT TYPE:  INP   LOCATION:  9320                          FACILITY:  [facility]   PHYSICIAN:  Adolph Pollack, M.D.DATE OF BIRTH:  October 15, 1960   DATE OF PROCEDURE:  07/27/2008  DATE OF DISCHARGE:                               OPERATIVE REPORT   PREOPERATIVE DIAGNOSIS:  Fascial dehiscence and evisceration.   POSTOPERATIVE DIAGNOSIS:  Fascial dehiscence and evisceration.   PROCEDURE:  Fascial closure.   SURGEON:  Adolph Pollack, MD   ASSISTANT:  Pieter Partridge, MD   ANESTHESIA:  General.   INDICATION:  This 50 year old female, who had a hysterectomy 2 weeks ago  and Dr. Dion Body just covered fascial dehiscence and evisceration.  While  in the operating room, she asked me for intraoperative consultation and  to help close her wound.   TECHNIQUE:  The wound had been opened by Dr. Dion Body.  There is no  evidence of purulent drainage in the pelvis.  The fascia was viable.  There was a transverse incision in the fascia.  The fascia was closed  with interrupted #1 Prolene sutures with allow for good approximation  without tension.  Subcutaneous tissue was then copiously irrigated and  the skin was closed with staples.  She tolerated the procedure well.  I  have talked with Dr. Dion Body about specific wound care instructions.  She is at a higher risk for incisional hernia given her obesity, the  dehiscence, and her smoking history.      Adolph Pollack, M.D.  Electronically Signed     TJR/MEDQ  D:  07/27/2008  T:  07/28/2008  Job:  [id number]   cc:   Pieter Partridge, MD  Fax: [phone]

## 2010-08-23 NOTE — Op Note (Signed)
Michele Owens, Michele Owens               ACCOUNT NO.:  [id number]   MEDICAL RECORD NO.:  [id number]          PATIENT TYPE:  INP   LOCATION:  9309                          FACILITY:  [facility]   PHYSICIAN:  Pieter Partridge, MD   DATE OF BIRTH:  01-19-1961   DATE OF PROCEDURE:  07/13/2008  DATE OF DISCHARGE:                               OPERATIVE REPORT   PREOPERATIVE DIAGNOSES:  Menometrorrhagia and uterine fibroids,  endometrial hyperplasia.   POSTOPERATIVE DIAGNOSES:  Menometrorrhagia and uterine fibroids,  endometrial hyperplasia.   PROCEDURE:  Total abdominal hysterectomy.   SURGEON:  Pieter Partridge, MD   ASSISTANT:  Arlyce Harman, MD   ANESTHESIA:  General.   FINDINGS:  Large fibroid uterus with a very large posterior fibroid.  Multiple fibroids present though, normal ovaries and fallopian tubes  bilaterally.  Also, the bladder was tacked to the lower uterine segment,  anterior fibroid.   SPECIMENS:  Uterus and cervix.   DISPOSITION OF SPECIMEN:  Pathology.   ESTIMATED BLOOD LOSS:  800 mL.   URINE OUTPUT:  150 mL of clear urine.   IV FLUIDS:  2500 mL.   COMPLICATIONS:  None.   DISPOSITION:  Stable to PACU.   PROCEDURE IN DETAIL:  Ms. Pitones was identified in the holding area.  She was then taken to the operating room and placed in the dorsal supine  position where she underwent general endotracheal anesthesia without  complication.  She then was prepped and draped in the normal sterile  fashion.  A Foley to gravity was placed.   Prior to prepping, the pannus was taped to help with retraction.   A skin incision was made with a scalpel over previous Pfannenstiel skin  incision, it was taken down to the underlying layer of the fascia, the  subcutaneous vessels were cauterized with Bovie cautery as needed.  The  fascia was then incised at the midline and extended laterally with the  curved Mayo scissors.  The Kocher clamps were placed on the upper leaf  of the fascia,  rectus muscles were dissected off sharply.  There were  several perforators or bleeding that were made hemostatic by the Bovie  cautery.  The Kocher clamps were then placed on this it.  The lower leaf  of the fascia, same was done.  The Kocher clamps were then used at the  midline of the muscle and the scalpel was used to separate the muscle  sharply until intraperitoneal access was noted.  The abdomen was then  stretched.  The O'Connor-O'Sullivan retractor was then inserted.  The  bowel was packed with moist laparotomy sponges while the patient was in  Trendelenburg.   For retraction, a stay suture was placed at the fundus with a 0 Vicryl  that was attached to a hemostat for mobilization.  The anatomy was quite  distorted because of the multiple fibroids.  The uterus had several  lower uterine segment fibroids and posterior fibroids.  The round  ligaments were identified on both sides.  On the right side, the round  ligaments were clamped with Kochers x2 and cut  and suture ligated with 0  Vicryl.  On the uterus as well as the pelvic portion of the round  ligament.  Actually, some of the bladder was taken down prior to  clamping the round ligament just to restore normal anatomy.  The  Metzenbaum scissors were used to do that anteriorly.  Once the round  ligament was transected, continued to develop the bladder flap with the  Metzenbaum scissors.  A window was made in the broad ligament and the  Heaney clamps were used to clamp the utero-ovarian ligament and the  ovary and fallopian tubes x2 that was cut, and then that was suture  ligated with 0 Vicryl and free hand 0 ties.  Attention was then turned  to the opposite side, the round ligament was a little more challenging  to see on that side, but that was transected and ligated in a similar  fashion.  The uterus was grasped with a Tresa Endo and the Heaney clamps were  used to clamp the ovary and fallopian tube and that was cut with the  Mayo  and then suture ligated and almost hemostatic.  The bladder flap  was further developed.  There was a large posterior fibroid which made  it difficult.  The cervix was kind of anterior and actually was above  the fibroid, so we attempted to grasp the uterine arteries and then  amputate the body of the uterus.  It was very difficult and it was very  tight to even to visualize the placement of the uterus where the uterine  artery clamped.  We were able to clamp the uterine artery, we had a  regular Foley catheter and tied it in double knot and clamped it with a  Tresa Endo and then we amputated the fibroid uterus above the Foley.  The  stump was then grasped with a Lahey tenaculum.   The bladder flap was actually well developed.  The Metzenbaum and the  scalpel were used to take that out further.  There were no noticeable  adhesions.  After this point, the straight Haney clamps were then used  to skeletonize the cervix and were suture ligated with 0 Vicryl pop off.  The angle was then grasped with the curved Mayo and the cervix was  removed with Jorgenson scissors.  The angles were sutured with 0 Vicryl.  The cuff was then reapproximated at the midline with several interrupted  sutures of 0 Vicryl.  At this point, the surgery was very difficult.  There was redundant peritoneum that was in the field.  Now, Deavers were  used to retract this redundant tissue, but it was very hard and the  pelvis was very, very deep, so to close the vaginal cuff, it was grasped  with Allis clamps and the bladder was retracted and the cuff was  reapproximated.   The pelvis was copiously irrigated.  The left ovary was plicated to the  round ligament.  All sponges were removed from the abdomen and the count  was correct at that time.  The peritoneum and muscle were reapproximated  at the midline with 2 interrupted sutures.  The hemostasis was achieved  with the Bovie at the rectus muscles.  The fascia was then closed  with a  0 Vicryl in a continuous running fashion.  The subcutaneous base was  then reapproximated with 2-0 plain gut and the skin was then  reapproximated with 4-0 Vicryl on a Keith needle, dressing was applied.   The patient tolerated  the procedure well.  She was taken to recovery  room in stable condition.  All needle and instrument and sponge counts  were correct x3.      Pieter Partridge, MD  Electronically Signed    EBV/MEDQ  D:  07/13/2008  T:  07/14/2008  Job:  [id number]

## 2010-08-24 ENCOUNTER — Ambulatory Visit: Payer: No Typology Code available for payment source

## 2010-08-26 NOTE — Op Note (Signed)
NAMEJESSIKA, ROTHERY                           ACCOUNT NO.:  [id number]   MEDICAL RECORD NO.:  [id number]                   PATIENT TYPE:  EMS   LOCATION:  ED                                   FACILITY:  [facility]   PHYSICIAN:  Lorne Skeens. Hoxworth, M.D.          DATE OF BIRTH:  Feb 20, 1961   DATE OF PROCEDURE:  04/01/2003  DATE OF DISCHARGE:                                 OPERATIVE REPORT   DIAGNOSIS:  Skin abscess, right breast.   PROCEDURE:  I&D of skin abscess, right breast.   DESCRIPTION OF PROCEDURE:  Ms. Henthorn is a 50 year old black female with a  history of skin and soft tissue infections.  She presents with a 2-3 day  history of increasing swelling and pain of an area just beneath the right  nipple.  Examination reveals a superficial 3 cm skin abscess.  After  explaining the procedure, under sterile technique, the skin was anesthetized  and an I&D performed, and a moderate amount of thick purulent material  drained.  The cavity was packed with Betadine gauze, and dry sterile  dressing was applied.  She was given a prescription for Vicodin and Keflex  and follow up in our office.                                               Lorne Skeens. Hoxworth, M.D.    Tory Emerald  D:  04/02/2003  T:  04/02/2003  Job:  [id number]

## 2010-08-26 NOTE — Op Note (Signed)
   NAMETAURIEL, SCRONCE                           ACCOUNT NO.:  [id number]   MEDICAL RECORD NO.:  [id number]                   PATIENT TYPE:  AMB   LOCATION:  DSC                                  FACILITY:  [facility]   PHYSICIAN:  Earvin Hansen L. Shon Hough, M.D.           DATE OF BIRTH:  27-Apr-1960   DATE OF PROCEDURE:  12/22/2002  DATE OF DISCHARGE:                                 OPERATIVE REPORT   OPERATION PERFORMED:  Excision of the area using liposuction assistance.   SURGEON:  Yaakov Guthrie. Shon Hough, M.D.   ASSISTANT:  Alethia Berthold, CFA, C-PAC   ANESTHESIA:  MAC.   INDICATIONS FOR PROCEDURE:  Patient with a large enlarging mass involving  her right posterior back scapular area measuring approximately 8 x 12 cm  with increased growth.   DESCRIPTION OF PROCEDURE:  The patient was placed in prone position, prep  was done to the back areas using Betadine scrub and solution and walled off  with sterile towels and drapes so as to make a sterile field.  0.5%  Xylocaine with epinephrine was injected locally.  A total of 50mL. This was  allowed to set up.  Then two small punctate incisions were made on the right  and left side of the wound and using the liposuction catheter #4, we  liposuctioned the areas removing out excessive tissue consistent with  lipomatous mass.  This was deep down under the fascia.  After proper  hemostasis, a big pressure dressing was applied to the back and HypaFix.  She withstood the procedures very well and was taken to recovery in good  condition.                                                Yaakov Guthrie. Shon Hough, M.D.    Cathie Hoops  D:  12/22/2002  T:  12/22/2002  Job:  [id number]

## 2010-08-26 NOTE — Discharge Summary (Signed)
Michele Owens, Michele Owens               ACCOUNT NO.:  [id number]   MEDICAL RECORD NO.:  [id number]          PATIENT TYPE:  INP   LOCATION:  9320                          FACILITY:  [facility]   PHYSICIAN:  Pieter Partridge, MD   DATE OF BIRTH:  09/17/1960   DATE OF ADMISSION:  07/27/2008  DATE OF DISCHARGE:  07/30/2008                               DISCHARGE SUMMARY   ADMITTING DIAGNOSIS:  Wound dehiscence with evisceration.   PROCEDURES:  Exploratory laparotomy and fascial closure by general  surgeon.   POSTOPERATIVE DIAGNOSIS:  Wound dehiscence with evisceration.   HOSPITAL COURSE:  [name] underwent a total abdominal hysterectomy on  July 12, 2008, for large uterine fibroids.  The surgery was complicated  by excessive blood loss of 800 mL but otherwise no other complications.  She had had issues with constipation, nausea, and vomiting; and upon  routine wound check, it was noted that there was a reducible mass that  was visualized through the incision.  Fascia was not intact.  The  patient was immediately transferred over to [facility].  An  exploratory laparotomy was noted.  Upon opening the incision, small  bowel was noted and the fascia was completely dehisced, no approximation  at all.  The tissue did not appear to be healthy tissue and there were  some serosal edges of the bile that were questionable.  A general  surgeon was called in for consult to evaluate.   During the procedure, the general surgeon closed the fascia with a  series of 1-0 Prolene interrupted sutures of figure-of-eight  pursestrings.  The fascia was reapproximated well, although some of  these tissues had pull through.  The subcutaneous base was then  reapproximated due to the lack of infection and the wound was very clean  and the skin was reapproximated with staples.   Postoperative course was uncomplicated due to her previous issues with  bowel function.  Her diet was advanced very slowly.  She was made  n.p.o.  until she had active bowel sounds and flatus.  She was then advanced  onto a low-sodium diet and was tolerating that by the time of discharge.  Wound care was reviewed early with the patient in the hospital.  Wound  was intact.  No signs of infection.  The staples look good.  Because of  a large pannus, pads were placed in the fold of the pannus and the  patient was educated on this.  Her white count was 19.8 after the  surgery and hemoglobin was 19.9.  Wound cultures were done  intraoperatively and they returned negative.  She was given Ancef IV 2 g  every 6 hours due to the elevated white count.  She had a low-grade  fever on postop day #2 of 100.7, but her white count was dropping.  Antibiotics were continued for another 24 hours.  The patient had  asthma.  She was placed on a nebulizer and inhaler.  An aggressive  incentive spirometry was performed.  She was also started on  multivitamin.  An abdominal binder was provided.   At  the time of discharge, the patient was doing well.  She was afebrile.  Vital signs were within normal limits.  Lungs were clear.  No wheezing.  No coughing.  She was discharged home.  Instructions were no heavy  lifting, to continue the abdominal binder, cut back on the smoking.  No  coughing.   DISCHARGE MEDICATIONS:  Percocet and Naprosyn 500.   WOUND CARE:  The patient was to change abdominal pads 3-4 times a day.  Home health was consulted for observation of the wound and assistance as  needed.  The patient was to return back to my office in 1 week.      Pieter Partridge, MD  Electronically Signed     EBV/MEDQ  D:  09/02/2008  T:  09/03/2008  Job:  [id number]

## 2010-08-29 ENCOUNTER — Ambulatory Visit: Payer: No Typology Code available for payment source

## 2010-09-01 ENCOUNTER — Ambulatory Visit: Payer: No Typology Code available for payment source

## 2010-09-04 ENCOUNTER — Inpatient Hospital Stay (HOSPITAL_COMMUNITY)
Admission: AD | Admit: 2010-09-04 | Discharge: 2010-09-05 | Disposition: A | Discharge status: Home / Self Care | Payer: Medicaid Other | Source: Ambulatory Visit | Attending: Obstetrics & Gynecology | Admitting: Obstetrics & Gynecology

## 2010-09-04 ENCOUNTER — Inpatient Hospital Stay (HOSPITAL_COMMUNITY): Payer: Medicaid Other

## 2010-09-04 DIAGNOSIS — N949 Unspecified condition associated with female genital organs and menstrual cycle: Secondary | ICD-10-CM | POA: Insufficient documentation

## 2010-09-04 DIAGNOSIS — N83209 Unspecified ovarian cyst, unspecified side: Secondary | ICD-10-CM | POA: Insufficient documentation

## 2010-09-04 DIAGNOSIS — R52 Pain, unspecified: Secondary | ICD-10-CM

## 2010-09-06 ENCOUNTER — Ambulatory Visit: Payer: No Typology Code available for payment source

## 2010-09-07 ENCOUNTER — Inpatient Hospital Stay (HOSPITAL_COMMUNITY)
Admission: AD | Admit: 2010-09-07 | Discharge: 2010-09-07 | Disposition: A | Discharge status: Home / Self Care | Payer: Medicaid Other | Source: Ambulatory Visit | Attending: Obstetrics & Gynecology | Admitting: Obstetrics & Gynecology

## 2010-09-07 DIAGNOSIS — R1031 Right lower quadrant pain: Secondary | ICD-10-CM

## 2010-09-07 DIAGNOSIS — A5901 Trichomonal vulvovaginitis: Secondary | ICD-10-CM | POA: Insufficient documentation

## 2010-09-07 LAB — RAPID URINE DRUG SCREEN, HOSP PERFORMED
Amphetamines: NOT DETECTED
Cocaine: NOT DETECTED
Tetrahydrocannabinol: POSITIVE — AB

## 2010-09-07 LAB — CBC
HCT: 41.6 % (ref 36.0–46.0)
MCV: 89.1 fL (ref 78.0–100.0)
RDW: 14.1 % (ref 11.5–15.5)
WBC: 17.3 10*3/uL — ABNORMAL HIGH (ref 4.0–10.5)

## 2010-09-07 LAB — URINE MICROSCOPIC-ADD ON

## 2010-09-07 LAB — COMPREHENSIVE METABOLIC PANEL
AST: 14 U/L (ref 0–37)
Albumin: 3.6 g/dL (ref 3.5–5.2)
Alkaline Phosphatase: 84 U/L (ref 39–117)
Chloride: 97 mEq/L (ref 96–112)
GFR calc Af Amer: >60 mL/min (ref >60)
Potassium: 3.3 mEq/L — ABNORMAL LOW (ref 3.5–5.1)
Total Bilirubin: 0.2 mg/dL — ABNORMAL LOW (ref 0.3–1.2)

## 2010-09-07 LAB — DIFFERENTIAL
Eosinophils Relative: 1 % (ref 0–5)
Lymphocytes Relative: 18 % (ref 12–46)
Lymphs Abs: 3.1 10*3/uL (ref 0.7–4.0)
Monocytes Absolute: 0.9 10*3/uL (ref 0.1–1.0)
Monocytes Relative: 5 % (ref 3–12)

## 2010-09-07 LAB — URINALYSIS, ROUTINE W REFLEX MICROSCOPIC
Bilirubin Urine: NEGATIVE
Glucose, UA: NEGATIVE mg/dL
Ketones, ur: NEGATIVE mg/dL
pH: 5.5 (ref 5.0–8.0)

## 2010-09-08 ENCOUNTER — Ambulatory Visit: Payer: No Typology Code available for payment source

## 2010-09-08 LAB — GC/CHLAMYDIA PROBE AMP, URINE: GC Probe Amp, Urine: NEGATIVE

## 2010-09-14 ENCOUNTER — Emergency Department (HOSPITAL_COMMUNITY): Payer: Medicaid Other

## 2010-09-14 ENCOUNTER — Emergency Department (HOSPITAL_COMMUNITY)
Admission: EM | Admit: 2010-09-14 | Discharge: 2010-09-14 | Disposition: A | Discharge status: Home / Self Care | Payer: Medicaid Other | Attending: Emergency Medicine | Admitting: Emergency Medicine

## 2010-09-14 DIAGNOSIS — I10 Essential (primary) hypertension: Secondary | ICD-10-CM | POA: Insufficient documentation

## 2010-09-14 DIAGNOSIS — R05 Cough: Secondary | ICD-10-CM | POA: Insufficient documentation

## 2010-09-14 DIAGNOSIS — E785 Hyperlipidemia, unspecified: Secondary | ICD-10-CM | POA: Insufficient documentation

## 2010-09-14 DIAGNOSIS — J45909 Unspecified asthma, uncomplicated: Secondary | ICD-10-CM | POA: Insufficient documentation

## 2010-09-14 DIAGNOSIS — R059 Cough, unspecified: Secondary | ICD-10-CM | POA: Insufficient documentation

## 2010-09-14 DIAGNOSIS — E669 Obesity, unspecified: Secondary | ICD-10-CM | POA: Insufficient documentation

## 2010-09-14 DIAGNOSIS — F17200 Nicotine dependence, unspecified, uncomplicated: Secondary | ICD-10-CM | POA: Insufficient documentation

## 2010-09-14 LAB — URINALYSIS, ROUTINE W REFLEX MICROSCOPIC
Glucose, UA: NEGATIVE mg/dL
Protein, ur: NEGATIVE mg/dL
pH: 5.5 (ref 5.0–8.0)

## 2010-09-14 LAB — URINE MICROSCOPIC-ADD ON

## 2010-09-14 LAB — CK TOTAL AND CKMB (NOT AT ARMC)
Relative Index: INVALID (ref 0.0–2.5)
Total CK: 65 U/L (ref 7–177)

## 2010-09-20 ENCOUNTER — Encounter: Payer: Self-pay | Admitting: Family Medicine

## 2010-09-20 ENCOUNTER — Ambulatory Visit (INDEPENDENT_AMBULATORY_CARE_PROVIDER_SITE_OTHER): Payer: Medicaid Other | Admitting: Family Medicine

## 2010-09-20 DIAGNOSIS — M545 Low back pain, unspecified: Secondary | ICD-10-CM

## 2010-09-20 DIAGNOSIS — M25559 Pain in unspecified hip: Secondary | ICD-10-CM

## 2010-09-20 MED ORDER — TRAMADOL HCL 50 MG PO TABS: 50.0000 mg | ORAL_TABLET | Freq: Four times a day (QID) | ORAL | Status: AC | PRN

## 2010-09-20 NOTE — Progress Notes (Signed)
50 yo patient, 06/07/2010 DOI documented below, presents in f/u of accident described below, now primarily with buttocks pain, but however she generally is significantly improved compared to on prior examinations. She has been doing physical therapy, riding a bicycle in her apartment complex, and reports compliance with her home exercise program. Neurologically remains intact. The patient also has a TENS unit, which he thinks is helping. PT notes reviewed.  07/05/2010 OV: 50 yo F here in f/u, L hip and back pain, doing better after fall per below 06/07/2010. Still in a great deal of pain, but improved motion, walking better. Occaisionally using a cane. She did have a R calf cramp over the weekend.  Continues to denies red flags, weakness, anesthesia, radiculopathy, incontinence.  MRI of the L spine and sacrum reviewed with patient. Mild L4-5 and L5-S1 disk herniation, but no nerve or spinal encroachment. No T2 signal in bone.   06/14/2010 OV  pleasant 50 year old female who presents for evaluation of LEFT hip and back pain that occurred after an incident per her report on June 07, 2010. She describes is to wear she fell at [employer] over some soda cans in the aisle, and subsequently hurt her hip and back. She doesn't recall the exact details regarding the point of impact, however at this point she does have significant LEFT lower buttocks and outside pelvic pain as well as LEFT lower back pain.  She was initially seen at urgent care at the date of injury. At that time lumbar spine series was obtained, and has been reviewed by myself as well, which is grossly unremarkable and shows no evidence of occult fracture. No acute pathology.  She presents still in exquisite pain and significant moderate distress today. She was given oxycodone and a muscle relaxant,, but is not clear of the name of the muscle relaxant she is given. At this point, she does not feel like it is helping significantly.  She is  having some difficulty walking and no gross weakness, saddle anesthesia, or bowel or bladder incontinence.   REVIEW OF SYSTEMS  GEN: No fevers, chills. Nontoxic. Primarily MSK c/o today. MSK: Detailed in the HPI GI: tolerating PO intake without difficulty Neuro: No numbness, parasthesias, or tingling associated. Otherwise the pertinent positives of the ROS are noted above.    Gen: Well-developed,well-nourished,in no acute distress; alert,appropriate and cooperative throughout examination  HEENT: Normocephalic and atraumatic without obvious abnormalities. Ears, externally no deformities  Pulm: Breathing comfortably in no respiratory distress  Range of motion at the waist: effectively normal   Flexion: WNL Extension: WNL Rotation: WNL No echymosis or edema  Rises to examination table with no problem  Gait: non antalgic Inspection/Deformity: N  Paraspinus T: NT  B SLR, seated: neg  B Greater Troch: NT B Log Roll: neg  B Stork: NT  B Sciatic Notch: TTP L > R Tentative palpation the piriformis and in the gluteus medius region.  Hip abduction is 4/5.  Assessment and plan: Back pain, improved Hip pain: Some SI joint dysfunction as well as pain in her hip abductors, which would make some sense given weakness and alteration of gait from injury and back pain it has been ongoing. Overall the, the patient is dramatically improved.  I added in some specific hip rehabilitation, and we'll recheck the patient in 2 months

## 2010-09-20 NOTE — Patient Instructions (Signed)
Before activity, take 1 Tramadol and 2 tylenol if you need it for pain.  PIRIFORMIS SYNDROME REHAB 1. Work on pretzel stretching, shoulder back and leg draped in front. 3-5 sets, 30 sec.. 2. hip abductor rotations. standing, hip flexion and rotation outward then inward. 3 sets, 15 reps. when can do comfortably, add ankle weights starting at 2 pounds.  3. cross over stretching - shoulder back to ground, same side leg crossover. 3-5 sets for 30 min..  4. SINK STRETCH - YOU CAN DO THIS WHENEVER YOU WANT DURING THE DAY  Recheck 8 weeks

## 2010-09-27 ENCOUNTER — Other Ambulatory Visit (HOSPITAL_COMMUNITY): Payer: Self-pay | Admitting: Obstetrics and Gynecology

## 2010-09-27 DIAGNOSIS — Z1231 Encounter for screening mammogram for malignant neoplasm of breast: Secondary | ICD-10-CM

## 2010-10-06 ENCOUNTER — Ambulatory Visit (HOSPITAL_COMMUNITY)
Admission: RE | Admit: 2010-10-06 | Discharge: 2010-10-06 | Disposition: A | Discharge status: Home / Self Care | Payer: Medicaid Other | Source: Ambulatory Visit | Attending: Obstetrics and Gynecology | Admitting: Obstetrics and Gynecology

## 2010-10-06 DIAGNOSIS — Z1231 Encounter for screening mammogram for malignant neoplasm of breast: Secondary | ICD-10-CM | POA: Insufficient documentation

## 2010-11-08 ENCOUNTER — Ambulatory Visit (INDEPENDENT_AMBULATORY_CARE_PROVIDER_SITE_OTHER): Payer: Medicaid Other | Admitting: Family Medicine

## 2010-11-08 ENCOUNTER — Ambulatory Visit: Payer: Medicaid Other | Admitting: Family Medicine

## 2010-11-08 DIAGNOSIS — M545 Low back pain, unspecified: Secondary | ICD-10-CM

## 2010-11-08 DIAGNOSIS — M25559 Pain in unspecified hip: Secondary | ICD-10-CM

## 2010-11-08 NOTE — Progress Notes (Signed)
Michele Owens, a 50 y.o. female presents today in the office for the following:    Pleasant patient who I have seen after a number of months he had some acute back pain as well as hip pain after an accident  She has been very active, she is lost weight, and she has finished her formal physical therapy with excellent success. She now is having no pain and no limitations, with full range of motion at the waist and hip. She felt stronger and much better than previously.  REVIEW OF SYSTEMS  GEN: No fevers, chills. Nontoxic. Primarily MSK c/o today. MSK: Detailed in the HPI GI: tolerating PO intake without difficulty Neuro: No numbness, parasthesias, or tingling associated. Otherwise the pertinent positives of the ROS are noted above.     Physical Exam  There were no vitals taken for this visit.  Gen: Well-developed,well-nourished,in no acute distress; alert,appropriate and cooperative throughout examination HEENT: Normocephalic and atraumatic without obvious abnormalities.  Ears, externally no deformities Pulm: Breathing comfortably in no respiratory distress Range of motion at  the waist: Flexion: normal Extension: normal Lateral bending: normal Rotation: all normal  No echymosis or edema Rises to examination table with no difficulty Gait: non antalgic  Inspection/Deformity: N Paraspinus T: nt  SENSORY B Medial Foot (L4): WNL B Dorsum (L5): WNL B Lateral (S1): WNL Light Touch: WNL  B SLR, seated: neg B SLR, supine: neg B Greater Troch: NT  Assessment and Plan: 1.  back pain hip pain, resolved. I encouraged the patient to continue with her exercise program and back maintenance program. Discharge to home exercise program and followup when necessary

## 2010-11-15 ENCOUNTER — Ambulatory Visit: Payer: Self-pay | Admitting: Family Medicine

## 2010-12-22 ENCOUNTER — Other Ambulatory Visit: Payer: Self-pay | Admitting: Gastroenterology

## 2011-01-05 LAB — URINE CULTURE
Colony Count: NO GROWTH
Culture: NO GROWTH

## 2011-01-05 LAB — URINALYSIS, ROUTINE W REFLEX MICROSCOPIC
Glucose, UA: NEGATIVE
Protein, ur: 30 — AB
Specific Gravity, Urine: 1.025
pH: 5.5

## 2011-01-05 LAB — URINE MICROSCOPIC-ADD ON

## 2011-01-26 IMAGING — US US TRANSVAGINAL NON-OB
1 series · 13 of 25 positions shown · non-contrast
Comparison: None

CLINICAL DATA: Abnormal uterine bleeding for 1 month.  Questionable
LMP.

TRANSABDOMINAL AND TRANSVAGINAL ULTRASOUND OF PELVIS
TECHNIQUE: Both transabdominal and transvaginal ultrasound
examinations of the pelvis were performed including evaluation of
the uterus, ovaries, adnexal regions, and pelvic cul-de-sac.

[Series 1: us transvaginal non-ob · 13 of 36 slices shown]
[im 1/36]
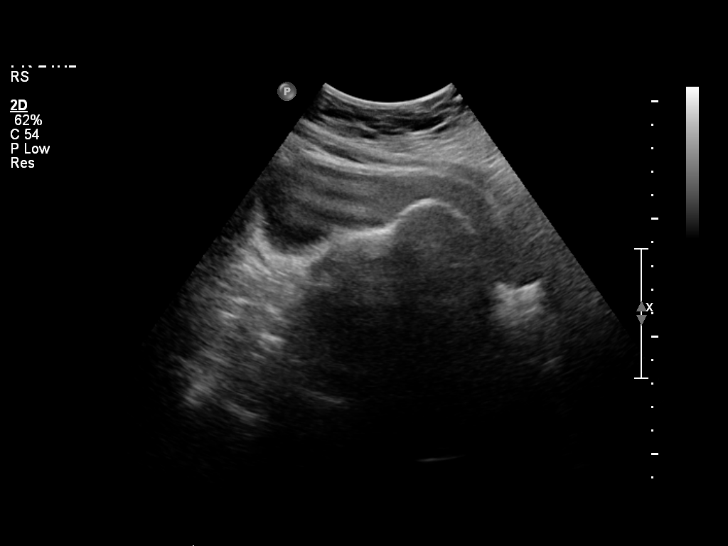
[im 3/36]
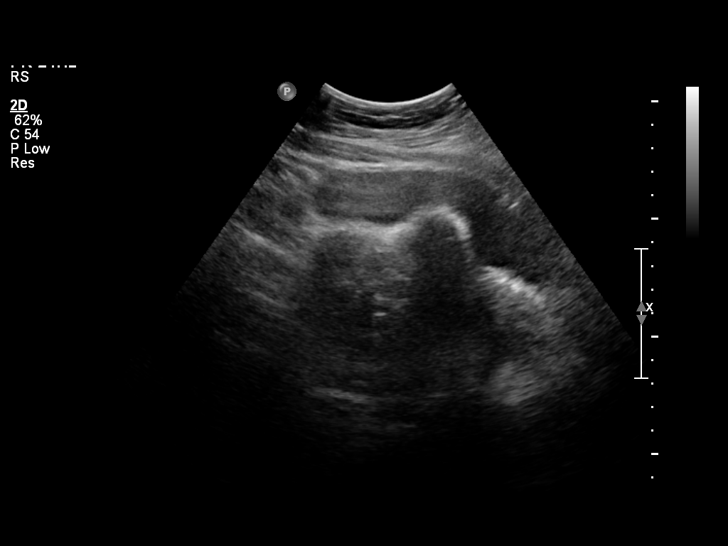
[im 6/36]
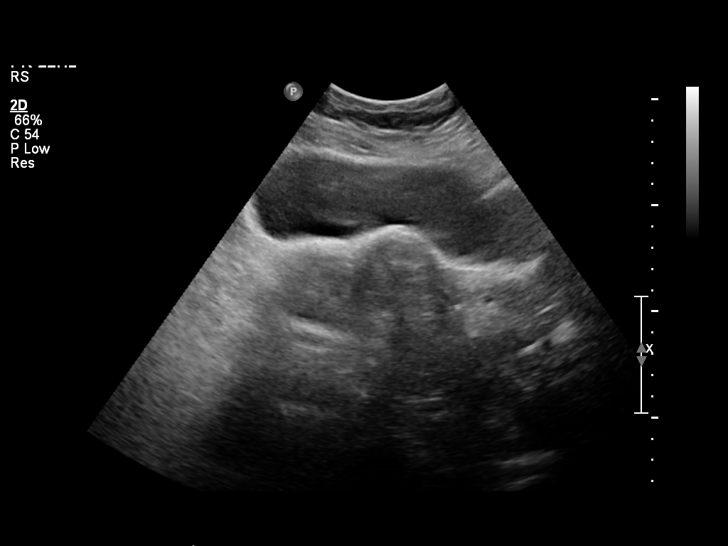
[im 9/36]
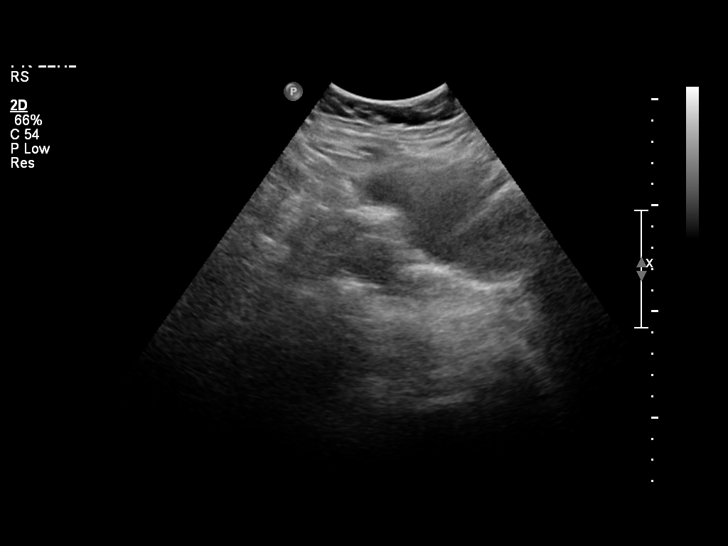
[im 12/36]
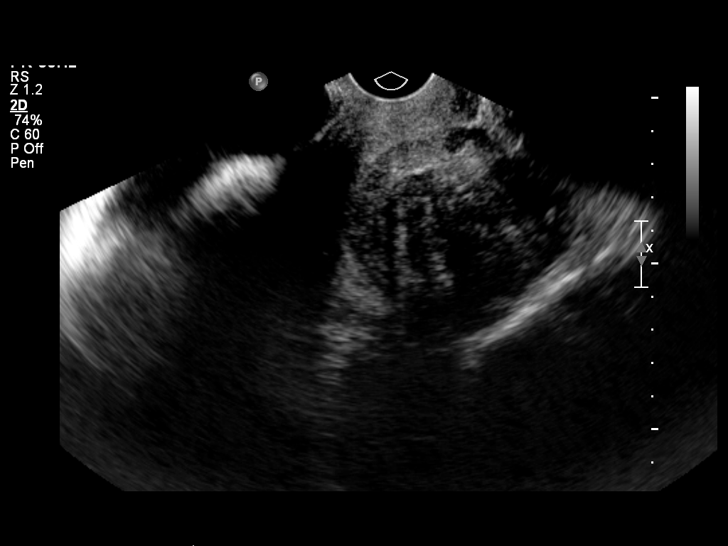
[im 15/36]
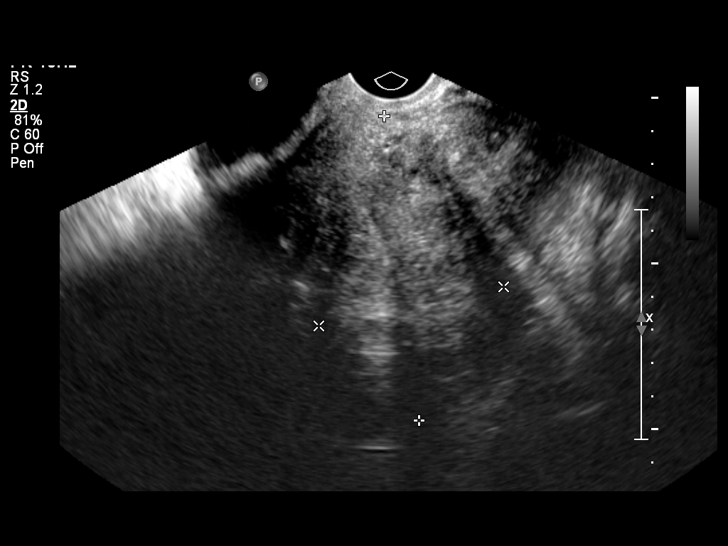
[im 18/36]
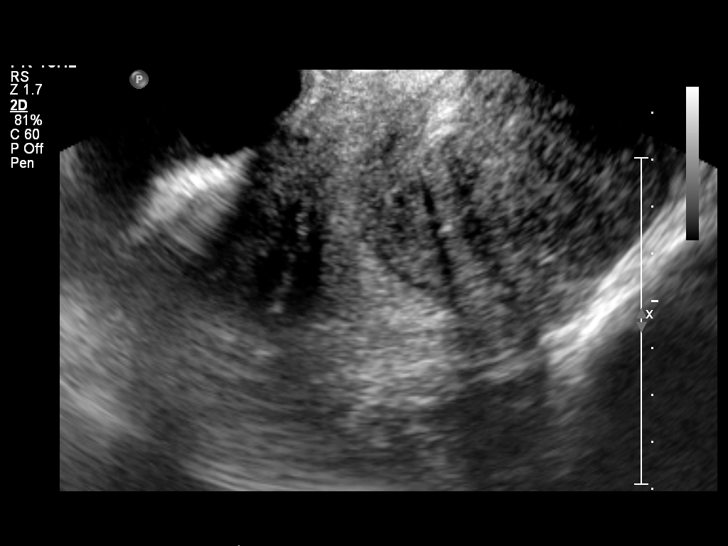
[im 21/36]
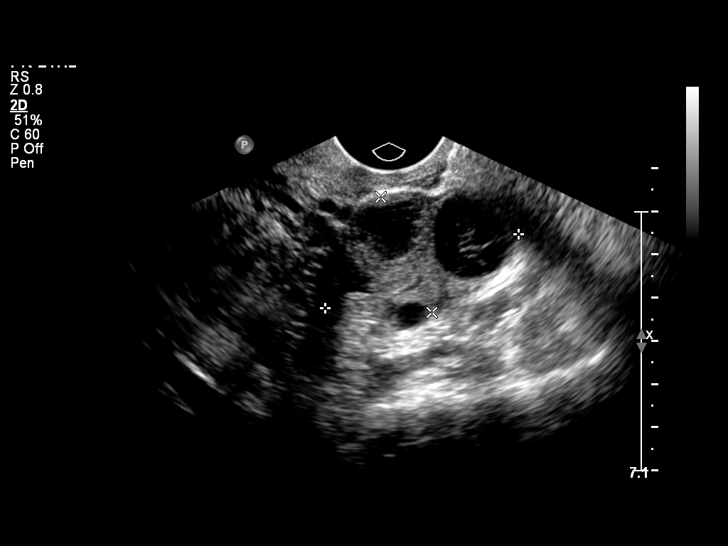
[im 24/36]
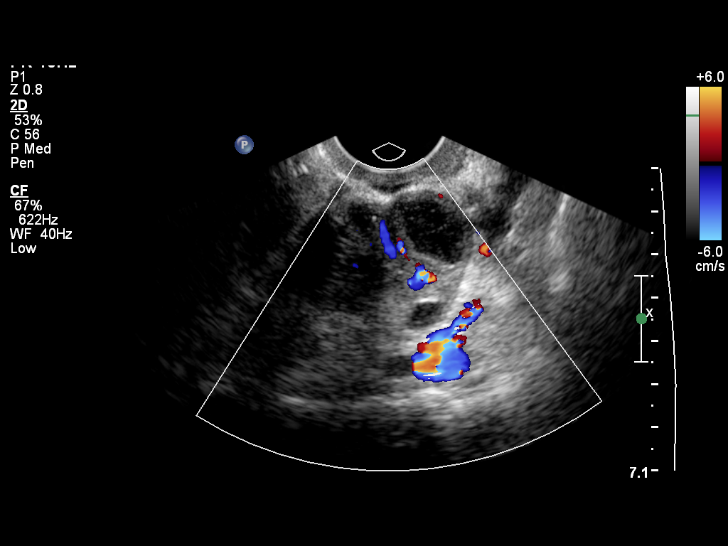
[im 27/36]
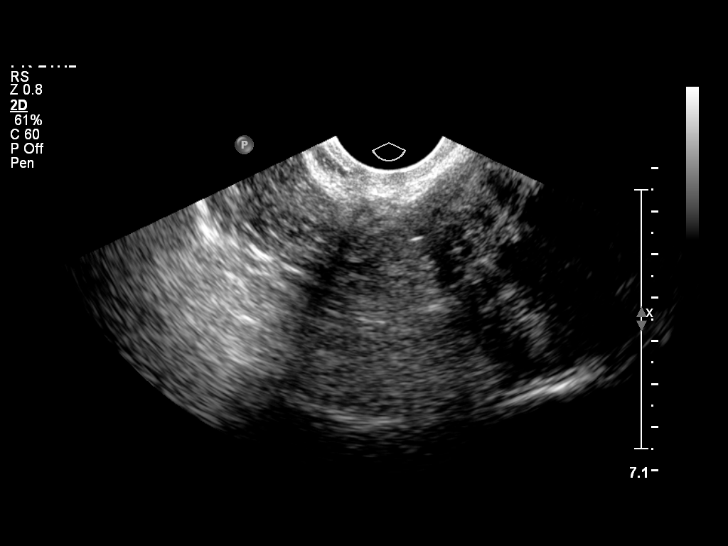
[im 30/36]
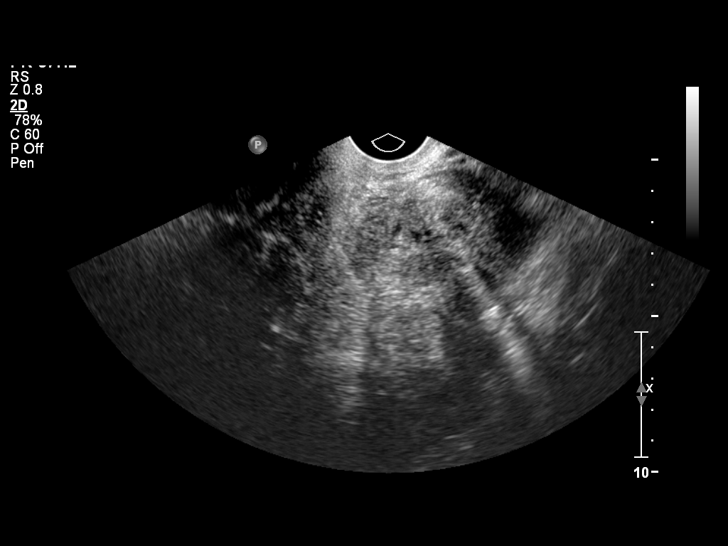
[im 33/36]
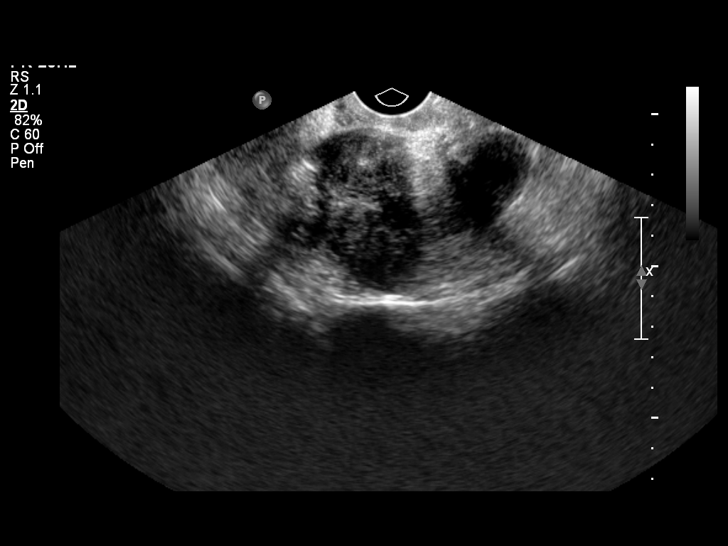
[im 36/36]
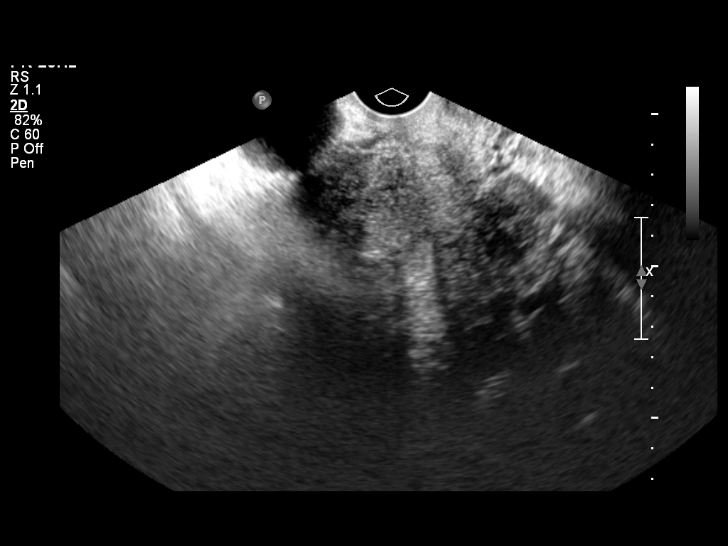

[13 of 25 positions shown; findings below may reference images not displayed]

FINDINGS: Endovaginal evaluation is suboptimal due to uterine
positioning combined with limited transducer mobility as a result
of patient body habitus.

The uterus demonstrates an approximate length of 9 cm, an
approximate width of 5 cm and an approximate width of 5 cm.
Endovaginally the endometrial stripe can be partially seen and
appears echogenic with an AP width of 1 cm.  No definite focal
endometrial abnormalities are seen but evaluation is limited by
limited transducer range.

The uterine myometrium is lobulated and inhomogeneous with a focal
partial subserosal fibroid suggested transabdominally in the
anterior lower uterine segment measuring approximately 5 x 4 1/2
cm.  A second fibroid is seen in the posterior lower uterine
segment endovaginally measuring 6 x 5.5 x 6.3 cm.  More diffuse
fibroid involvement is suggestive of poorly assessed.

The left ovary measures 4.8 x 2.9 x 3.2 cm and contains a
collapsing corpus luteum and several follicles.  The right ovary
could not be seen with confidence either transabdominally or
endovaginally.  No definite pelvic fluid is seen.
IMPRESSION: Suboptimal ultrasound evaluation of the uterine myometrium and
endometrium for reasons noted above.  Diffuse fibroid involvement
is suspected with at least two discrete fibroids which could be
measured.  Non-visualized right ovary.  Unremarkable left ovary.
If further evaluation of the uterine myometrium is desired, MRI
would be useful for assessing for other fibroids and their
relationship with the endometrial lining.  Sono hysterography could
be attempted for evaluation of the endometrial lining but given the
limitations of endovaginal probe excursion is likely to be of
limited quality.

REF:W1 DICTATED: 05/22/2008 [DATE]

## 2011-02-28 ENCOUNTER — Other Ambulatory Visit: Payer: Self-pay

## 2011-02-28 ENCOUNTER — Encounter (HOSPITAL_COMMUNITY): Payer: Self-pay | Admitting: Emergency Medicine

## 2011-02-28 ENCOUNTER — Emergency Department (HOSPITAL_COMMUNITY)
Admission: EM | Admit: 2011-02-28 | Discharge: 2011-03-01 | Disposition: A | Discharge status: Home / Self Care | Payer: Medicaid Other | Attending: Emergency Medicine | Admitting: Emergency Medicine

## 2011-02-28 DIAGNOSIS — F411 Generalized anxiety disorder: Secondary | ICD-10-CM | POA: Insufficient documentation

## 2011-02-28 DIAGNOSIS — I498 Other specified cardiac arrhythmias: Secondary | ICD-10-CM | POA: Insufficient documentation

## 2011-02-28 DIAGNOSIS — R002 Palpitations: Secondary | ICD-10-CM | POA: Insufficient documentation

## 2011-02-28 DIAGNOSIS — I471 Supraventricular tachycardia, unspecified: Secondary | ICD-10-CM

## 2011-02-28 DIAGNOSIS — I10 Essential (primary) hypertension: Secondary | ICD-10-CM | POA: Insufficient documentation

## 2011-02-28 DIAGNOSIS — Z79899 Other long term (current) drug therapy: Secondary | ICD-10-CM | POA: Insufficient documentation

## 2011-02-28 DIAGNOSIS — R0602 Shortness of breath: Secondary | ICD-10-CM | POA: Insufficient documentation

## 2011-02-28 DIAGNOSIS — R42 Dizziness and giddiness: Secondary | ICD-10-CM | POA: Insufficient documentation

## 2011-02-28 DIAGNOSIS — R079 Chest pain, unspecified: Secondary | ICD-10-CM | POA: Insufficient documentation

## 2011-02-28 HISTORY — DX: Essential (primary) hypertension: I10

## 2011-02-28 NOTE — ED Notes (Signed)
PER EMS- Pt was in SVT, hr was 170s. SVT started around 10:45. No history of cardiac related events. Received 12 of Adenosine in route, heart rate responded, now in the 110's. Pt has 18 in right AC.

## 2011-03-01 ENCOUNTER — Emergency Department (HOSPITAL_COMMUNITY): Payer: Medicaid Other

## 2011-03-01 LAB — CBC
MCH: 29 pg (ref 26.0–34.0)
MCHC: 33 g/dL (ref 30.0–36.0)
MCV: 88 fL (ref 78.0–100.0)
Platelets: 393 10*3/uL (ref 150–400)

## 2011-03-01 LAB — DIFFERENTIAL
Basophils Relative: 0 % (ref 0–1)
Eosinophils Absolute: 0.2 10*3/uL (ref 0.0–0.7)
Eosinophils Relative: 1 % (ref 0–5)
Lymphs Abs: 4.8 10*3/uL — ABNORMAL HIGH (ref 0.7–4.0)
Neutrophils Relative %: 69 % (ref 43–77)

## 2011-03-01 LAB — BASIC METABOLIC PANEL
BUN: 11 mg/dL (ref 6–23)
Calcium: 9.3 mg/dL (ref 8.4–10.5)
GFR calc Af Amer: >90 mL/min (ref >90)
GFR calc non Af Amer: >90 mL/min (ref >90)
Glucose, Bld: 117 mg/dL — ABNORMAL HIGH (ref 70–99)
Potassium: 3.6 mEq/L (ref 3.5–5.1)
Sodium: 138 mEq/L (ref 135–145)

## 2011-03-01 MED ORDER — METOPROLOL TARTRATE 25 MG PO TABS
ORAL_TABLET | ORAL | Status: AC
  Filled 2011-03-01: qty 1

## 2011-03-01 MED ORDER — METOCLOPRAMIDE HCL 5 MG/ML IJ SOLN
10.0000 mg | Freq: Once | INTRAMUSCULAR | Status: AC
  Administered 2011-03-01: 10 mg via INTRAVENOUS
  Filled 2011-03-01: qty 2

## 2011-03-01 MED ORDER — DEXAMETHASONE SODIUM PHOSPHATE 10 MG/ML IJ SOLN
10.0000 mg | Freq: Once | INTRAMUSCULAR | Status: AC
  Administered 2011-03-01: 10 mg via INTRAVENOUS
  Filled 2011-03-01: qty 1

## 2011-03-01 MED ORDER — DIPHENHYDRAMINE HCL 50 MG/ML IJ SOLN
25.0000 mg | Freq: Once | INTRAMUSCULAR | Status: AC
  Administered 2011-03-01: 25 mg via INTRAVENOUS
  Filled 2011-03-01: qty 1

## 2011-03-01 MED ORDER — METOPROLOL SUCCINATE ER 25 MG PO TB24
25.0000 mg | ORAL_TABLET | Freq: Every day | ORAL | Status: DC
  Administered 2011-03-01: 25 mg via ORAL

## 2011-03-01 MED ORDER — METOPROLOL SUCCINATE ER 25 MG PO TB24: 25.0000 mg | ORAL_TABLET | Freq: Every day | ORAL | Status: DC

## 2011-03-01 NOTE — ED Provider Notes (Signed)
History     CSN: [id number] Arrival date & time: 02/28/2011 11:58 PM   First MD Initiated Contact with Patient 02/28/11 2359      Chief Complaint  Patient presents with  . Tachycardia    (Consider location/radiation/quality/duration/timing/severity/associated sxs/prior treatment) Patient is a 50 y.o. female presenting with palpitations.  Palpitations  This is a new problem. The current episode started less than 1 hour ago. The problem occurs constantly. The problem has been resolved. Associated with: nothing. On average, each episode lasts 30 minutes. Associated symptoms include irregular heartbeat, dizziness and shortness of breath. Pertinent negatives include no diaphoresis, no fever, no chest pain, no chest pressure, no orthopnea, no syncope, no nausea, no vomiting, no weakness and no cough. She has tried nothing for the symptoms. Improvement on treatment: when EMS arrived pt in SVT and converted her with 12 of adenosine. Risk factors include no known risk factors.    Past Medical History  Diagnosis Date  . Hypertension     History reviewed. No pertinent past surgical history.  No family history on file.  History  Substance Use Topics  . Smoking status: Current Everyday Smoker  . Smokeless tobacco: Never Used  . Alcohol Use: No    OB History    Grav Para Term Preterm Abortions TAB SAB Ect Mult Living                  Review of Systems  Constitutional: Negative for fever and diaphoresis.  Respiratory: Positive for shortness of breath. Negative for cough.   Cardiovascular: Positive for palpitations. Negative for chest pain, orthopnea and syncope.  Gastrointestinal: Negative for nausea and vomiting.  Neurological: Positive for dizziness. Negative for weakness.  All other systems reviewed and are negative.    Allergies  Ibuprofen  Home Medications   Current Outpatient Rx  Name Route Sig Dispense Refill  . HYDROCHLOROTHIAZIDE 25 MG PO TABS Oral Take 25 mg by  mouth daily.      Marland Kitchen PRAVASTATIN SODIUM 20 MG PO TABS Oral Take 20 mg by mouth daily.        BP 120/85  Pulse 102  Temp(Src) 98.4 F (36.9 C) (Oral)  Resp 21  SpO2 100%  Physical Exam  Nursing note and vitals reviewed. Constitutional: She is oriented to person, place, and time. She appears well-developed and well-nourished. No distress.  HENT:  Head: Normocephalic and atraumatic.  Eyes: EOM are normal. Pupils are equal, round, and reactive to light.  Cardiovascular: Regular rhythm, normal heart sounds and intact distal pulses.  Tachycardia present.  Exam reveals no friction rub.   No murmur heard. Pulmonary/Chest: Effort normal and breath sounds normal. She has no wheezes. She has no rales.  Abdominal: Soft. Bowel sounds are normal. She exhibits no distension. There is no tenderness. There is no rebound and no guarding.  Musculoskeletal: Normal range of motion. She exhibits no tenderness.       No edema  Neurological: She is alert and oriented to person, place, and time. No cranial nerve deficit.  Skin: Skin is warm and dry. No rash noted.  Psychiatric: She has a normal mood and affect. Her behavior is normal.       Tearful and a bit anxious    ED Course  Procedures (including critical care time)  Labs Reviewed  CBC - Abnormal; Notable for the following:    WBC 19.0 (*)    All other components within normal limits  DIFFERENTIAL - Abnormal; Notable for the following:  Neutro Abs 13.2 (*)    Lymphs Abs 4.8 (*)    All other components within normal limits  BASIC METABOLIC PANEL - Abnormal; Notable for the following:    Glucose, Bld 117 (*)    All other components within normal limits   Dg Chest 2 View  03/01/2011  *RADIOLOGY REPORT*  Clinical Data: Chest pain.  CHEST - 2 VIEW 03/01/2011:  Comparison: Two-view chest x-ray 09/14/2010, 09/26/2007 [facility], and 01/07/2007 [facility].  Findings: Suboptimal inspiration accounts for crowded bronchovascular  markings, especially in the lung bases, and accentuates the cardiac silhouette.  Taking this into account, cardiac silhouette upper normal in size to slightly enlarged but stable.  Lungs clear.  Pulmonary vascularity normal. Bronchovascular markings normal.  No pleural effusions. Degenerative changes involving the thoracic spine.  IMPRESSION: Stable borderline to mild cardiomegaly.  No acute cardiopulmonary disease.  Original Report Authenticated By: Arnell Sieving, M.D.     Date: 03/01/2011  Rate: 103  Rhythm: sinus tachycardia  QRS Axis: normal  Intervals: normal  ST/T Wave abnormalities: normal  Conduction Disutrbances:none  Narrative Interpretation:   Old EKG Reviewed: none available and unchanged    1. SVT (supraventricular tachycardia)       MDM   Pt found to be in SVT today by EMS and indicated on the strips and given 12 of adenosine and converted to sinus rhythm.  No documented SVT in the past however pt states she occasionally will get palpatations.  Pt is now assymptomatic other than being a little shaken up.  Pt denies CP, SOB or other complaints.  EKG here shows mild sinus tachy.  Will check CBC, BMP and CXR. Will d/c pt's HCTZ and start her on toprol 25mg  and have her f/u.   Labs normal other than leukocytosis for unknown reason.  Looking back at prior notes pt always has luekocytosis for unknown reason.  She denies any infectious sx and o/w is well appearing.  Continues to have normal HR here.  BMP wnl and CXR wnl. Will d/c home with lopressor.     Gwyneth Sprout, MD 03/01/11 [phone]

## 2011-03-10 ENCOUNTER — Ambulatory Visit: Payer: Medicaid Other | Admitting: Cardiology

## 2011-03-23 ENCOUNTER — Encounter: Payer: Self-pay | Admitting: *Deleted

## 2011-03-24 ENCOUNTER — Ambulatory Visit: Payer: Medicaid Other | Admitting: Cardiology

## 2011-03-28 ENCOUNTER — Ambulatory Visit: Payer: Medicaid Other | Admitting: Cardiovascular Disease

## 2011-03-30 ENCOUNTER — Other Ambulatory Visit: Payer: Self-pay | Admitting: Cardiology

## 2011-03-30 NOTE — Telephone Encounter (Signed)
Pt needs refill of metroprolol called into [facility] on [address]

## 2011-03-31 ENCOUNTER — Other Ambulatory Visit: Payer: Self-pay | Admitting: *Deleted

## 2011-04-07 NOTE — Telephone Encounter (Signed)
Pt calling re refill request from 12-20 for Metoprolol, med not yet refilled and is out, pls call [phone]

## 2011-04-10 MED ORDER — METOPROLOL SUCCINATE ER 25 MG PO TB24: 25.0000 mg | ORAL_TABLET | Freq: Every day | ORAL | Status: DC

## 2011-04-10 NOTE — Telephone Encounter (Signed)
FU Call: Pt calling stating that she is out of metoprolol and needs this medication called into [facility] ASAP. Please call pt when RX has been called in.

## 2011-05-05 ENCOUNTER — Ambulatory Visit: Payer: Medicaid Other | Admitting: Cardiology

## 2011-05-09 ENCOUNTER — Other Ambulatory Visit: Payer: Self-pay | Admitting: Cardiology

## 2011-05-09 MED ORDER — METOPROLOL SUCCINATE ER 25 MG PO TB24: 25.0000 mg | ORAL_TABLET | Freq: Every day | ORAL | Status: DC

## 2011-05-25 ENCOUNTER — Encounter: Payer: Self-pay | Admitting: *Deleted

## 2011-05-30 ENCOUNTER — Ambulatory Visit: Payer: Medicaid Other | Admitting: Cardiology

## 2011-06-16 ENCOUNTER — Other Ambulatory Visit: Payer: Self-pay | Admitting: Cardiology

## 2011-06-16 NOTE — Telephone Encounter (Signed)
Pt called she only has two pills. She has appt [phone]

## 2011-06-18 ENCOUNTER — Other Ambulatory Visit: Payer: Self-pay | Admitting: Cardiology

## 2011-06-19 ENCOUNTER — Telehealth: Payer: Self-pay | Admitting: Cardiology

## 2011-06-19 NOTE — Telephone Encounter (Signed)
..   Requested Prescriptions   Pending Prescriptions Disp Refills  . metoprolol succinate (TOPROL-XL) 25 MG 24 hr tablet [Pharmacy Med Name: METOPROLOL ER 25MG   TAB] 15 tablet 0    Sig: TAKE ONE TABLET BY MOUTH EVERY DAY  refilled per Katina Dung

## 2011-06-19 NOTE — Telephone Encounter (Signed)
Refill until appt 07/03/11 with Dr Shirlee Latch sent to pharmacy today. Pt is aware

## 2011-06-19 NOTE — Telephone Encounter (Signed)
New problem Pt said she requested refill of metoprolol last week and still hasnt got her refill. She said she has been to [facility] on [address] twice. She said she has appt with Dr Shirlee Latch on [phone] but doesn't have any more pills. Please let her know when done.

## 2011-07-03 ENCOUNTER — Ambulatory Visit (INDEPENDENT_AMBULATORY_CARE_PROVIDER_SITE_OTHER): Payer: Medicaid Other | Admitting: Cardiology

## 2011-07-03 ENCOUNTER — Encounter: Payer: Self-pay | Admitting: Cardiology

## 2011-07-03 VITALS — BP 124/78 | HR 70 | Ht 65.0 in | Wt 236.0 lb

## 2011-07-03 DIAGNOSIS — R011 Cardiac murmur, unspecified: Secondary | ICD-10-CM

## 2011-07-03 DIAGNOSIS — I498 Other specified cardiac arrhythmias: Secondary | ICD-10-CM

## 2011-07-03 DIAGNOSIS — E669 Obesity, unspecified: Secondary | ICD-10-CM

## 2011-07-03 DIAGNOSIS — I471 Supraventricular tachycardia: Secondary | ICD-10-CM

## 2011-07-03 DIAGNOSIS — F17200 Nicotine dependence, unspecified, uncomplicated: Secondary | ICD-10-CM

## 2011-07-03 NOTE — Patient Instructions (Signed)
Your physician has requested that you have an echocardiogram. Echocardiography is a painless test that uses sound waves to create images of your heart. It provides your doctor with information about the size and shape of your heart and how well your heart's chambers and valves are working. This procedure takes approximately one hour. There are no restrictions for this procedure.  Dr Shirlee Latch has referred you to a nutritionist for weight loss management.  Your physician wants you to follow-up in: 1 year with Dr Shirlee Latch. (March 2014). You will receive a reminder letter in the mail two months in advance. If you don't receive a letter, please call our office to schedule the follow-up appointment.

## 2011-07-04 ENCOUNTER — Other Ambulatory Visit: Payer: Self-pay | Admitting: Cardiology

## 2011-07-04 DIAGNOSIS — Z720 Tobacco use: Secondary | ICD-10-CM | POA: Insufficient documentation

## 2011-07-04 MED ORDER — METOPROLOL SUCCINATE ER 25 MG PO TB24: 25.0000 mg | ORAL_TABLET | Freq: Every day | ORAL | Status: DC

## 2011-07-04 NOTE — Progress Notes (Signed)
PCP: Tracey Harries  51 yo with history of HTN, obesity, hyperlipidemia, and SVT presents for cardiology evaluation.  She is seen today because of an episode in 11/12 that took her to the ER.  She felt her heart pounding one day in 11/12 and called EMS. She was found to be in rapid SVT.  She was given adenosine by EMS with conversion back to NSR.  She was seen in the ER and started on a beta blocker.  She was discharged from the ER and told to followup with cardiology, which she is doing today.  She has had no episodes of tachypalpitations since that time.  She does not remember episodes of tachypalpitations prior to the episode in 11/12.  ECG is not pre-excited.  She has no history of syncope or presyncope.  She drinks 2-3 cups of coffee a day.  She does not use any illicit drugs.  She has taken Toprol XL 25 mg daily since the episode in 11/12.  She denies chest pain.  She does get short of breath if she walks fast or climbs a flight of steps.  She does smoke cigarettes.   ECG: NSR, normal (not pre-excited)  Labs (11/12): K 3.6, creatinine 0.73  PMH: 1. Back and hip pain 2. HTN 3. Obesity 4. GERD 5. Hyperlipidemia 6. Depression 7. Active smoker 8. 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   SH: Smoker.  Lives alone.  Does not use drugs or ETOH.   FH: Father with cancer, mother with diabetes, brother with fen-phen related valvulopathy  ROS: All systems reviewed and negative except as per HPI.   Current Outpatient Prescriptions  Medication Sig Dispense Refill  . omeprazole (PRILOSEC) 40 MG capsule Take 40 mg by mouth daily.      Marland Kitchen PARoxetine (PAXIL) 20 MG tablet Take 20 mg by mouth daily.      . pravastatin (PRAVACHOL) 20 MG tablet Take 20 mg by mouth daily.       . metoprolol succinate (TOPROL-XL) 25 MG 24 hr tablet Take 1 tablet (25 mg total) by mouth daily.  30 tablet  5    BP 124/78  Pulse 70  Ht 5\' 5"  (1.651 m)  Wt 236 lb (107.049 kg)   BMI 39.27 kg/m2 General: NAD, obese.  Neck: No JVD, no thyromegaly or thyroid nodule.  Lungs: Clear to auscultation bilaterally with normal respiratory effort. CV: Nondisplaced PMI.  Heart regular S1/S2, no S3/S4, 1/6 SEM RUSB.  No peripheral edema.  No carotid bruit.  Normal pedal pulses.  Abdomen: Soft, nontender, no hepatosplenomegaly, no distention.  Skin: Intact without lesions or rashes.  Neurologic: Alert and oriented x 3.  Psych: Normal affect. Extremities: No clubbing or cyanosis.  HEENT: Normal.

## 2011-07-04 NOTE — Assessment & Plan Note (Signed)
I encouraged her to quit smoking.  She wants to try electronic cigarettes.

## 2011-07-04 NOTE — Assessment & Plan Note (Addendum)
Episode of SVT in 11/12 terminating with adenosine.  No documented episode before or since.  She is on Toprol XL for arrhythmia suppression, which I will have her continue.  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  ECG today is normal without evidence for pre-excitation.  I will get an echocardiogram to make sure that her heart is structurally normal.  Need to check TSH.  She will try to cut back on caffeine intake.  We talked about vagal maneuvers to use in the event of a recurrent SVT episode.

## 2011-07-04 NOTE — Assessment & Plan Note (Signed)
Patient needs to work on diet and weight loss.  BMI is 39.  I am going to refer her to a nutritionist.

## 2011-07-05 ENCOUNTER — Telehealth: Payer: Self-pay | Admitting: Cardiology

## 2011-07-05 NOTE — Telephone Encounter (Signed)
Pt is having a lot of headaches and she took big spoon full of vinegar and she needs to talk to someone

## 2011-07-05 NOTE — Telephone Encounter (Signed)
Spoke with pt. Pt states she has been having frequent headaches. She states she does have a history of migraine headaches.  She had been on another medication for BP and was concerned because she was told BP med was in one of the medications she is taking now and she did not need another medication for her BP. We discussed metoprolol and the fact that her BP was good at time of office visit with Dr Shirlee Latch 07/03/11. She states she has not been told recently that her BP had been elevated. She will try to check her BP periodically. She states Dr Patsy Lager is her PCP and she will contact him for further recommendations for her headaches.

## 2011-07-17 ENCOUNTER — Ambulatory Visit (HOSPITAL_COMMUNITY): Payer: Medicaid Other

## 2011-07-21 ENCOUNTER — Other Ambulatory Visit (HOSPITAL_COMMUNITY): Payer: Medicaid Other

## 2011-07-28 ENCOUNTER — Ambulatory Visit (HOSPITAL_COMMUNITY): Payer: Medicaid Other | Attending: Cardiovascular Disease

## 2011-07-28 ENCOUNTER — Other Ambulatory Visit: Payer: Self-pay

## 2011-07-28 DIAGNOSIS — F17200 Nicotine dependence, unspecified, uncomplicated: Secondary | ICD-10-CM | POA: Insufficient documentation

## 2011-07-28 DIAGNOSIS — R011 Cardiac murmur, unspecified: Secondary | ICD-10-CM

## 2011-07-28 DIAGNOSIS — J45909 Unspecified asthma, uncomplicated: Secondary | ICD-10-CM | POA: Insufficient documentation

## 2011-08-01 ENCOUNTER — Telehealth: Payer: Self-pay | Admitting: Cardiology

## 2011-08-01 NOTE — Telephone Encounter (Signed)
Fu call  °Pt returning your call about test results °

## 2011-08-01 NOTE — Telephone Encounter (Signed)
Spoke with pt about recent echo results. 

## 2011-11-10 ENCOUNTER — Other Ambulatory Visit (HOSPITAL_COMMUNITY): Payer: Self-pay | Admitting: Obstetrics and Gynecology

## 2011-11-10 DIAGNOSIS — Z1231 Encounter for screening mammogram for malignant neoplasm of breast: Secondary | ICD-10-CM

## 2011-11-23 ENCOUNTER — Ambulatory Visit (HOSPITAL_COMMUNITY): Payer: Medicaid Other | Attending: Obstetrics and Gynecology

## 2012-02-28 ENCOUNTER — Emergency Department (HOSPITAL_COMMUNITY)
Admission: EM | Admit: 2012-02-28 | Discharge: 2012-02-28 | Disposition: A | Discharge status: Home / Self Care | Payer: Medicaid Other | Attending: Emergency Medicine | Admitting: Emergency Medicine

## 2012-02-28 ENCOUNTER — Encounter (HOSPITAL_COMMUNITY): Payer: Self-pay | Admitting: Emergency Medicine

## 2012-02-28 ENCOUNTER — Emergency Department (HOSPITAL_COMMUNITY): Payer: Medicaid Other

## 2012-02-28 DIAGNOSIS — I10 Essential (primary) hypertension: Secondary | ICD-10-CM | POA: Insufficient documentation

## 2012-02-28 DIAGNOSIS — Z79899 Other long term (current) drug therapy: Secondary | ICD-10-CM | POA: Insufficient documentation

## 2012-02-28 DIAGNOSIS — E669 Obesity, unspecified: Secondary | ICD-10-CM | POA: Insufficient documentation

## 2012-02-28 DIAGNOSIS — M7989 Other specified soft tissue disorders: Secondary | ICD-10-CM | POA: Insufficient documentation

## 2012-02-28 DIAGNOSIS — Z8742 Personal history of other diseases of the female genital tract: Secondary | ICD-10-CM | POA: Insufficient documentation

## 2012-02-28 DIAGNOSIS — R079 Chest pain, unspecified: Secondary | ICD-10-CM

## 2012-02-28 DIAGNOSIS — R0989 Other specified symptoms and signs involving the circulatory and respiratory systems: Secondary | ICD-10-CM | POA: Insufficient documentation

## 2012-02-28 DIAGNOSIS — R0609 Other forms of dyspnea: Secondary | ICD-10-CM | POA: Insufficient documentation

## 2012-02-28 DIAGNOSIS — J45909 Unspecified asthma, uncomplicated: Secondary | ICD-10-CM | POA: Insufficient documentation

## 2012-02-28 DIAGNOSIS — F17200 Nicotine dependence, unspecified, uncomplicated: Secondary | ICD-10-CM | POA: Insufficient documentation

## 2012-02-28 LAB — URINALYSIS, ROUTINE W REFLEX MICROSCOPIC
Bilirubin Urine: NEGATIVE
Glucose, UA: NEGATIVE mg/dL
Protein, ur: NEGATIVE mg/dL
Specific Gravity, Urine: 1.023 (ref 1.005–1.030)
Urobilinogen, UA: 0.2 mg/dL (ref 0.0–1.0)
pH: 6 (ref 5.0–8.0)

## 2012-02-28 LAB — BASIC METABOLIC PANEL
CO2: 24 mEq/L (ref 19–32)
Calcium: 9.2 mg/dL (ref 8.4–10.5)
Chloride: 106 mEq/L (ref 96–112)
Glucose, Bld: 104 mg/dL — ABNORMAL HIGH (ref 70–99)
Potassium: 3.8 mEq/L (ref 3.5–5.1)
Sodium: 140 mEq/L (ref 135–145)

## 2012-02-28 LAB — CBC
Hemoglobin: 12.4 g/dL (ref 12.0–15.0)
Platelets: 298 10*3/uL (ref 150–400)
RBC: 4.26 MIL/uL (ref 3.87–5.11)
WBC: 12.9 10*3/uL — ABNORMAL HIGH (ref 4.0–10.5)

## 2012-02-28 LAB — TROPONIN I: Troponin I: <0.3 ng/mL (ref <0.30)

## 2012-02-28 LAB — URINE MICROSCOPIC-ADD ON

## 2012-02-28 MED ORDER — ALBUTEROL SULFATE (5 MG/ML) 0.5% IN NEBU
5.0000 mg | INHALATION_SOLUTION | Freq: Once | RESPIRATORY_TRACT | Status: AC
  Administered 2012-02-28: 5 mg via RESPIRATORY_TRACT
  Filled 2012-02-28: qty 1

## 2012-02-28 NOTE — ED Notes (Signed)
324 of aspirin and 1 nitro given by EMS.

## 2012-02-28 NOTE — ED Notes (Signed)
Patient from home via [facility] with chest wall pain and SOB.  Patient ate pinto beans and onions last night and woke up with pain in her right upper chest.  Pain is non radiating at this time.  Hx of heartburn patient takes medication for it.

## 2012-02-28 NOTE — ED Notes (Signed)
Patient transported to X-ray 

## 2012-02-28 NOTE — ED Provider Notes (Signed)
History     CSN: [id number]  Arrival date & time 02/28/12  1754   First MD Initiated Contact with Patient 02/28/12 1826      Chief Complaint  Patient presents with  . Chest Pain     HPI The patient presents with concerns of right sided chest pain.  The symptoms began earlier today, insidiously.  Since onset symptoms have been persistent.  There was initially mild radiation towards the midline, but currently the patient only complains of right-sided chest pain.  The pain is sharp, severe, not improved with anything.  She notes mild concurrent dyspnea, but has a history of bronchitis, and smokes.  She denies lightheadedness, syncope, pleuritic or exertional pain. The patient denies changes in her lower extremity dimensions, notes that she has chronic swelling in her left leg, attributed to chronic knee pain.  She also has chronic swelling in her right ankle.  Past Medical History  Diagnosis Date  . Hypertension   . Pain in joint, pelvic region and thigh     LEFT  . Lumbago   . Asthma   . Bronchitis   . Obesity   . Ovarian cyst   . Heart burn     Past Surgical History  Procedure Date  . Total abdominal hysterectomy 07/14/10    No family history on file.  History  Substance Use Topics  . Smoking status: Current Every Day Smoker -- 1.0 packs/day    Types: Cigarettes  . Smokeless tobacco: Never Used  . Alcohol Use: Yes     Comment: occasionally    OB History    Grav Para Term Preterm Abortions TAB SAB Ect Mult Living                  Review of Systems  Constitutional:       Per HPI, otherwise negative  HENT:       Per HPI, otherwise negative  Eyes: Negative.   Respiratory:       Per HPI, otherwise negative  Cardiovascular:       Per HPI, otherwise negative  Gastrointestinal: Negative for vomiting.  Genitourinary: Negative.   Musculoskeletal:       Per HPI, otherwise negative  Skin: Negative.   Neurological: Negative for syncope.    Allergies   Ibuprofen  Home Medications   Current Outpatient Rx  Name  Route  Sig  Dispense  Refill  . METOPROLOL SUCCINATE ER 25 MG PO TB24   Oral   Take 1 tablet (25 mg total) by mouth daily.   30 tablet   5   . OMEPRAZOLE 40 MG PO CPDR   Oral   Take 40 mg by mouth daily.         Marland Kitchen PAROXETINE HCL 20 MG PO TABS   Oral   Take 20 mg by mouth daily.         Marland Kitchen PRAVASTATIN SODIUM 20 MG PO TABS   Oral   Take 20 mg by mouth daily.            BP 116/100  Pulse 70  Temp 98.4 F (36.9 C) (Oral)  Resp 21  SpO2 99%  Physical Exam  Nursing note and vitals reviewed. Constitutional: She is oriented to person, place, and time. She appears well-developed and well-nourished. No distress.  HENT:  Head: Normocephalic and atraumatic.  Eyes: Conjunctivae normal and EOM are normal.  Cardiovascular: Normal rate and regular rhythm.   Pulmonary/Chest: Effort normal. No stridor. No respiratory distress.  She has wheezes.  Abdominal: She exhibits no distension.  Musculoskeletal: She exhibits no tenderness.       Trace edema in bilateral lower extremities, with no appreciable difference in size  Neurological: She is alert and oriented to person, place, and time. No cranial nerve deficit.  Skin: Skin is warm and dry.  Psychiatric: She has a normal mood and affect.    ED Course  Procedures (including critical care time)   Labs Reviewed  CBC  BASIC METABOLIC PANEL  TROPONIN I   No results found.   No diagnosis found.   Date: 02/28/2012  Rate: 73  Rhythm: normal sinus rhythm  QRS Axis: normal  Intervals: normal  ST/T Wave abnormalities: normal  Conduction Disutrbances:none  Narrative Interpretation:   Old EKG Reviewed: unchanged NORMAL  Cardiac: 71sr, normal  O2- 99%ra, normal    10:20 PM Patient is ambulating about the room, seemingly in no distress. MDM  This generally well-appearing 51 year old female presents with new right sided chest discomfort.  On exam she is in  no distress.  The patient has clear lung sounds, unremarkable vital signs.  The patient has no urinary, abdominal complaints.  The patient does smoke, and she was counseled of the necessity to stop this, she's not tachypneic, tachycardic, hypoxic, with no lower extremity new edema, and low suspicion of thromboembolic processes.  Given the slight leukocytosis there suspicion of mild infection versus inflammation.  Absent distress, and the patient's unremarkable vital signs, largely reassuring labs, she was discharged with next day followup.  She has a cardiologist do to a prior episode of SVT, but there is no evidence of this phenomena today.        Gerhard Munch, MD 02/28/12 2221

## 2012-03-05 ENCOUNTER — Encounter: Payer: Medicaid Other | Admitting: Physician Assistant

## 2012-03-13 ENCOUNTER — Encounter: Payer: Medicaid Other | Admitting: Physician Assistant

## 2012-03-19 ENCOUNTER — Telehealth: Payer: Self-pay | Admitting: *Deleted

## 2012-03-19 ENCOUNTER — Encounter (HOSPITAL_COMMUNITY): Payer: Self-pay | Admitting: Emergency Medicine

## 2012-03-19 ENCOUNTER — Emergency Department (INDEPENDENT_AMBULATORY_CARE_PROVIDER_SITE_OTHER)
Admission: EM | Admit: 2012-03-19 | Discharge: 2012-03-19 | Disposition: A | Discharge status: Home / Self Care | Payer: Medicaid Other | Source: Home / Self Care | Attending: Family Medicine | Admitting: Family Medicine

## 2012-03-19 ENCOUNTER — Encounter: Payer: Self-pay | Admitting: Physician Assistant

## 2012-03-19 ENCOUNTER — Ambulatory Visit (INDEPENDENT_AMBULATORY_CARE_PROVIDER_SITE_OTHER): Payer: Medicaid Other | Admitting: Physician Assistant

## 2012-03-19 VITALS — BP 134/94 | HR 83 | Ht 65.0 in | Wt 249.1 lb

## 2012-03-19 DIAGNOSIS — I10 Essential (primary) hypertension: Secondary | ICD-10-CM

## 2012-03-19 DIAGNOSIS — E669 Obesity, unspecified: Secondary | ICD-10-CM

## 2012-03-19 DIAGNOSIS — F17200 Nicotine dependence, unspecified, uncomplicated: Secondary | ICD-10-CM

## 2012-03-19 DIAGNOSIS — I498 Other specified cardiac arrhythmias: Secondary | ICD-10-CM

## 2012-03-19 DIAGNOSIS — L02419 Cutaneous abscess of limb, unspecified: Secondary | ICD-10-CM

## 2012-03-19 DIAGNOSIS — R0683 Snoring: Secondary | ICD-10-CM

## 2012-03-19 DIAGNOSIS — R079 Chest pain, unspecified: Secondary | ICD-10-CM

## 2012-03-19 DIAGNOSIS — R0602 Shortness of breath: Secondary | ICD-10-CM

## 2012-03-19 DIAGNOSIS — R0609 Other forms of dyspnea: Secondary | ICD-10-CM

## 2012-03-19 DIAGNOSIS — IMO0002 Reserved for concepts with insufficient information to code with codable children: Secondary | ICD-10-CM

## 2012-03-19 DIAGNOSIS — I471 Supraventricular tachycardia: Secondary | ICD-10-CM

## 2012-03-19 LAB — BASIC METABOLIC PANEL
CO2: 26 mEq/L (ref 19–32)
GFR: 159.28 mL/min (ref >60.00)
Glucose, Bld: 100 mg/dL — ABNORMAL HIGH (ref 70–99)
Potassium: 3.9 mEq/L (ref 3.5–5.1)
Sodium: 135 mEq/L (ref 135–145)

## 2012-03-19 MED ORDER — METOPROLOL SUCCINATE ER 50 MG PO TB24: 50.0000 mg | ORAL_TABLET | Freq: Every day | ORAL | Status: DC

## 2012-03-19 MED ORDER — DOXYCYCLINE HYCLATE 100 MG PO CAPS: 100.0000 mg | ORAL_CAPSULE | Freq: Two times a day (BID) | ORAL | Status: DC

## 2012-03-19 NOTE — ED Notes (Signed)
Abscess under left arm.  Physician has been in to see patient already

## 2012-03-19 NOTE — Telephone Encounter (Signed)
Message copied by Tarri Fuller on Tue Mar 19, 2012  4:51 PM ------      Message from: [place], [place] T      Created: Tue Mar 19, 2012  4:29 PM       Potassium and kidney function look good.      BNP is normal       Continue with current treatment plan.      Tereso Newcomer, PA-C  4:49 PM 02/22/2012

## 2012-03-19 NOTE — ED Provider Notes (Signed)
History     CSN: [id number]  Arrival date & time 03/19/12  1230   First MD Initiated Contact with Patient 03/19/12 1354      Chief Complaint  Patient presents with  . Abscess    (Consider location/radiation/quality/duration/timing/severity/associated sxs/prior treatment) Patient is a 51 y.o. female presenting with abscess. The history is provided by the patient.  Abscess  This is a new problem. The current episode started less than one week ago. The onset was gradual. The problem has been gradually worsening. The abscess is present on the left arm. The problem is moderate. The abscess is characterized by painfulness and swelling. The abscess first occurred at home.    Past Medical History  Diagnosis Date  . Hypertension   . Pain in joint, pelvic region and thigh     back and hip pain  . HLD (hyperlipidemia)   . COPD (chronic obstructive pulmonary disease)   . Tobacco abuse   . Obesity   . Ovarian cyst   . GERD (gastroesophageal reflux disease)   . 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  Marland Kitchen Hx of echocardiogram     a. Echo 4/13:   EF 55-60%.  . Depression     Past Surgical History  Procedure Date  . Total abdominal hysterectomy 07/14/10    No family history on file.  History  Substance Use Topics  . Smoking status: Current Every Day Smoker -- 1.0 packs/day    Types: Cigarettes  . Smokeless tobacco: Never Used  . Alcohol Use: Yes     Comment: occasionally    OB History    Grav Para Term Preterm Abortions TAB SAB Ect Mult Living                  Review of Systems  Constitutional: Negative.   Skin: Positive for rash.    Allergies  Ibuprofen  Home Medications   Current Outpatient Rx  Name  Route  Sig  Dispense  Refill  . DOXYCYCLINE HYCLATE 100 MG PO CAPS   Oral   Take 1 capsule (100 mg total) by mouth 2 (two) times daily.   20 capsule   0   . METOPROLOL SUCCINATE ER 50 MG PO  TB24   Oral   Take 1 tablet (50 mg total) by mouth daily.   30 tablet   11   . OMEPRAZOLE 40 MG PO CPDR   Oral   Take 40 mg by mouth daily.         Marland Kitchen PAROXETINE HCL 20 MG PO TABS   Oral   Take 20 mg by mouth daily.         Marland Kitchen PRAVASTATIN SODIUM 20 MG PO TABS   Oral   Take 20 mg by mouth daily.            BP 147/85  Pulse 84  Temp 98.2 F (36.8 C) (Oral)  Resp 20  SpO2 97%  Physical Exam  Nursing note and vitals reviewed. Constitutional: She is oriented to person, place, and time. She appears well-developed and well-nourished.  Neurological: She is alert and oriented to person, place, and time.  Skin: Skin is warm and dry. There is erythema.       Left axillary fluctuant mass, erythematous    ED Course  INCISION AND DRAINAGE Date/Time: 03/19/2012 2:36 PM Performed by: Linna Hoff Authorized by: Bradd Canary D Consent: Verbal consent obtained. Consent  given by: patient Type: abscess Body area: upper extremity Location details: left arm Local anesthetic: topical anesthetic Patient sedated: no Scalpel size: 11 Incision type: single straight Complexity: simple Drainage: purulent Drainage amount: moderate Wound treatment: wound left open Patient tolerance: Patient tolerated the procedure well with no immediate complications. Comments: cukture obtained.   (including critical care time)   Labs Reviewed  CULTURE, ROUTINE-ABSCESS   No results found.   1. Axillary abscess       MDM          Linna Hoff, MD 03/19/12 1440

## 2012-03-19 NOTE — Telephone Encounter (Signed)
pt notified about lab rsults w/verbal understanding today

## 2012-03-19 NOTE — Patient Instructions (Addendum)
Your physician has requested that you have en exercise stress myoview. For further information please visit [email or web address] Please follow instruction sheet, as given.  Your physician recommends that you schedule a follow-up appointment in: MARCH 2014 WITH DR. Shirlee Latch  INCREASE TOPROL XL TO 50 MG DAILY, A NEW RX WAS SENT IN TODAY  BMET, BNP TODAY  You have been referred FOR A SLEEP STUDY TO BE DONE @ [facility] DX SNORING  PLEASE SCHEDULE AN APPT TO SEE A DIETICIAN FOR WEIGHT LOSS

## 2012-03-19 NOTE — Progress Notes (Signed)
[address] [place], [place]  [address] Phone: [phone], Fax:  [phone]  Date:  03/19/2012   Name:  Michele Owens   DOB:  [birth year]   MRN:  [id number]  PCP:  Sheila Oats, MD  Primary Cardiologist:  Dr. Marca Ancona  Primary Electrophysiologist:  None    History of Present Illness: Michele Owens is a 51 y.o. female who returns for follow up after recent visit to the ED for chest pain.   She has a hx of HTN, obesity, HL, COPD, and SVT.  Established with Dr. Marca Ancona in 3/13 because of an episode in 11/12 that took her to the ER. She felt her heart pounding one day in 11/12 and called EMS. She was found to be in rapid SVT. She was given adenosine by EMS with conversion back to NSR. She was seen in the ER and started on a beta blocker.  ECG was not pre-excited.  Echo 4/13:   EF 55-60%.    The patient went to the emergency room with chest pain 02/28/12. She had an elevated white count (WBC 12.9K).  CXR was without infiltrate.  Cardiac markers were normal. She tells me the pain occurred at rest.  Describes it as a tightness across her anterior chest.  No radiation to arm or jaw.  She notes associated dyspnea.  No nausea, diaphoresis.  No syncope.  She had a recent URI.  Has a lot of wheezing and cough and uses a nebulizer at home.  She denies orthopnea.  Notes being awoken at times with a choking sensation.  She admits to snoring and daytime hypersomnolence.  She notes worsening DOE over the last 6 mos.  She is probably Class IIb.  She has had some mild pedal edema.    Labs (11/12):   K 3.6, creatinine 0.73  Labs (11/13):   K 3.8, creatinine 0.50, Hgb 12.4   Wt Readings from Last 3 Encounters:  03/19/12 249 lb 1.9 oz (113 kg)  07/03/11 236 lb (107.049 kg)  06/14/10 232 lb (105.235 kg)     Past Medical History  Diagnosis Date  . Hypertension   . Pain in joint, pelvic region and thigh     back and hip pain  . HLD (hyperlipidemia)   . COPD  (chronic obstructive pulmonary disease)   . Tobacco abuse   . Obesity   . Ovarian cyst   . GERD (gastroesophageal reflux disease)   . 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  Marland Kitchen Hx of echocardiogram     a. Echo 4/13:   EF 55-60%.  . Depression     Current Outpatient Prescriptions  Medication Sig Dispense Refill  . metoprolol succinate (TOPROL-XL) 25 MG 24 hr tablet Take 1 tablet (25 mg total) by mouth daily.  30 tablet  5  . omeprazole (PRILOSEC) 40 MG capsule Take 40 mg by mouth daily.      Marland Kitchen PARoxetine (PAXIL) 20 MG tablet Take 20 mg by mouth daily.      . pravastatin (PRAVACHOL) 20 MG tablet Take 20 mg by mouth daily.         Allergies: Allergies  Allergen Reactions  . Ibuprofen Rash    Severe rash    Social History:  The patient  reports that she has been smoking Cigarettes.  She has been smoking about 1 pack per day. She has never used smokeless tobacco. She  reports that she drinks alcohol. She reports that she uses illicit drugs.   Family History:   Remote FHx of CAD in several aunts.  ROS:  Please see the history of present illness.   Has a hx of polyps and is due to go back for repeat colonoscopy soon.   All other systems reviewed and negative.   PHYSICAL EXAM: VS:  BP 134/94  Pulse 83  Ht 5\' 5"  (1.651 m)  Wt 249 lb 1.9 oz (113 kg)  BMI 41.46 kg/m2 Well nourished, well developed, in no acute distress HEENT: normal Neck: no JVD Vascular:  No carotid bruits Cardiac:  normal S1, S2; RRR; no murmur Lungs:  Decreased breath sounds bilaterally, expiratory wheezes, no rales Abd: soft, nontender, no hepatomegaly Ext: no edema Skin: warm and dry Neuro:  CNs 2-12 intact, no focal abnormalities noted  EKG:  NSR, HR 83, normal axis, NSSTTW changes, no change from prior tracing     ASSESSMENT AND PLAN:  1. Chest Pain:   Somewhat atypical.  However, she has a lot of risk factors for CAD.  She also  notes exertional dyspnea.  This is likely related to her COPD in the setting of ongoing tobacco abuse. She also notes increased weight with PND vs apneic episodes.  I suspect she has sleep apnea as well.  I will arrange an ETT-Myoview to rule out ischemia.  Volume appears stable.  No hx of CHF.  Will check BMET and BNP as well.    2. Hypertension:   Uncontrolled.  Increase Toprol to 50 mg QD.  3. Hyperlipidemia:   Managed by PCP.   4. Snoring:   She has a lot of symptoms that sound c/w sleep apnea.  Arrange sleep study.  5. Obesity:   She requests a referral to a dietician to help lose weight.  6. Tobacco Abuse:   She knows she needs to quit.  7. Supraventricular Tachycardia:   No apparent recurrence.  8. Disposition:   Follow up with Dr. Marca Ancona in 06/2012 as planned or sooner if her workup is abnormal.    Signed, Tereso Newcomer, PA-C  11:03 AM 03/19/2012

## 2012-03-22 LAB — CULTURE, ROUTINE-ABSCESS

## 2012-03-26 ENCOUNTER — Ambulatory Visit (HOSPITAL_COMMUNITY): Payer: Medicaid Other | Attending: Cardiovascular Disease | Admitting: Radiology

## 2012-03-26 VITALS — BP 126/93 | Ht 65.0 in | Wt 249.0 lb

## 2012-03-26 DIAGNOSIS — R42 Dizziness and giddiness: Secondary | ICD-10-CM | POA: Insufficient documentation

## 2012-03-26 DIAGNOSIS — I4949 Other premature depolarization: Secondary | ICD-10-CM

## 2012-03-26 DIAGNOSIS — F17200 Nicotine dependence, unspecified, uncomplicated: Secondary | ICD-10-CM | POA: Insufficient documentation

## 2012-03-26 DIAGNOSIS — R002 Palpitations: Secondary | ICD-10-CM | POA: Insufficient documentation

## 2012-03-26 DIAGNOSIS — I10 Essential (primary) hypertension: Secondary | ICD-10-CM | POA: Insufficient documentation

## 2012-03-26 DIAGNOSIS — R0609 Other forms of dyspnea: Secondary | ICD-10-CM | POA: Insufficient documentation

## 2012-03-26 DIAGNOSIS — R55 Syncope and collapse: Secondary | ICD-10-CM | POA: Insufficient documentation

## 2012-03-26 DIAGNOSIS — R0989 Other specified symptoms and signs involving the circulatory and respiratory systems: Secondary | ICD-10-CM | POA: Insufficient documentation

## 2012-03-26 DIAGNOSIS — J45909 Unspecified asthma, uncomplicated: Secondary | ICD-10-CM | POA: Insufficient documentation

## 2012-03-26 DIAGNOSIS — R0789 Other chest pain: Secondary | ICD-10-CM | POA: Insufficient documentation

## 2012-03-26 DIAGNOSIS — R0602 Shortness of breath: Secondary | ICD-10-CM

## 2012-03-26 MED ORDER — REGADENOSON 0.4 MG/5ML IV SOLN
0.4000 mg | Freq: Once | INTRAVENOUS | Status: AC
  Administered 2012-03-26: 0.4 mg via INTRAVENOUS

## 2012-03-26 MED ORDER — TECHNETIUM TC 99M SESTAMIBI GENERIC - CARDIOLITE
33.0000 | Freq: Once | INTRAVENOUS | Status: AC | PRN
  Administered 2012-03-26: 33 via INTRAVENOUS

## 2012-03-26 NOTE — Progress Notes (Signed)
[facility] SITE 3 NUCLEAR MED [address] [place], [place] [address] [phone]    Cardiology Nuclear Med Study  Michele Owens is a 51 y.o. female     MRN : [id number]     DOB: [birth year]  Procedure Date: 03/26/2012  Nuclear Med Background Indication for Stress Test:  Evaluation for Ischemia History:  Asthma and COPD:4/13' ECHO;EF=55-60%;SVT(11/12) Cardiac Risk Factors: Family History - CAD, Hypertension, Lipids and Smoker  Symptoms:  Chest Pain, Chest Tightness (last date of chest discomfort 02/28/12), Dizziness, DOE, Light-Headedness, Near Syncope, Palpitations, Rapid HR and SOB   Nuclear Pre-Procedure Caffeine/Decaff Intake:  11:30pm (Coke) NPO After: 11:30pm   Lungs:  clear O2 Sat: 97% on room air. IV 0.9% NS with Angio Cath:  22g  IV Site: R Antecubital x 1, tolerated well IV Started by:  Irean Hong, RN  Chest Size (in):  38 Cup Size: D  Height: 5\' 5"  (1.651 m)  Weight:  249 lb (112.946 kg)  BMI:  Body mass index is 41.44 kg/(m^2). Tech Comments:  Held Toprol x 24 hrs    Nuclear Med Study 1 or 2 day study: 2 day  Stress Test Type:  Stress  Reading MD: Willa Rough, MD  Order Authorizing Provider:  Marca Ancona, MD, and Tereso Newcomer, [facility]  Resting Radionuclide: Technetium 52m Sestamibi  Resting Radionuclide Dose: 33.0 mCi on 03/27/12   Stress Radionuclide:  Technetium 58m Sestamibi  Stress Radionuclide Dose: 33.0 mCi on 03/26/12           Stress Protocol Rest HR: 64 Stress HR: 144  Rest BP: 126/93 Stress BP: 125/101  Exercise Time (min): 5:00 METS: 6.50   Predicted Max HR: 169 bpm % Max HR: 85.21 bpm Rate Pressure Product: 69629    Dose of Adenosine (mg):  n/a Dose of Lexiscan: n/a mg  Dose of Atropine (mg): n/a Dose of Dobutamine: n/a mcg/kg/min (at max HR)  Stress Test Technologist: Frederick Peers, EMT-P  Nuclear Technologist:  Domenic Polite, CNMT     Rest Procedure:  Myocardial perfusion imaging was performed at rest 45 minutes  following the intravenous administration of Technetium 58m Sestamibi. Rest ECG: Normal EKG  Stress Procedure:  The patient exercised on the treadmill utilizing the Bruce Protocol for 5:00 minutes. The patient stopped due to SOB and expierenced  chest tightness that resolved during recovery.  Technetium 35m Sestamibi was injected at peak exercise and myocardial perfusion imaging was performed after a brief delay. Stress ECG: No significant ST segment change suggestive of ischemia.  QPS Raw Data Images:  Normal; no motion artifact; normal heart/lung ratio. Stress Images:  Normal homogeneous uptake in all areas of the myocardium. Rest Images:  Normal homogeneous uptake in all areas of the myocardium. Subtraction (SDS):  No evidence of ischemia. Transient Ischemic Dilatation (Normal <1.22):  0.84 Lung/Heart Ratio (Normal <0.45):  0.50  Quantitative Gated Spect Images QGS EDV:  95 ml QGS ESV:  38 ml  Impression Exercise Capacity:  Fair exercise capacity. BP Response:  Normal blood pressure response. Clinical Symptoms:  shortness of breath ECG Impression:  No significant ST segment change suggestive of ischemia. Comparison with Prior Nuclear Study: No images to compare  Overall Impression:  Normal stress nuclear study. There are no significant abnormalities noted.  LV Ejection Fraction: 60%.  LV Wall Motion:  Normal Wall Motion.  Willa Rough, MD

## 2012-03-27 ENCOUNTER — Ambulatory Visit (HOSPITAL_COMMUNITY): Payer: Medicaid Other

## 2012-03-27 DIAGNOSIS — R079 Chest pain, unspecified: Secondary | ICD-10-CM

## 2012-03-27 DIAGNOSIS — R0989 Other specified symptoms and signs involving the circulatory and respiratory systems: Secondary | ICD-10-CM

## 2012-03-27 MED ORDER — TECHNETIUM TC 99M SESTAMIBI GENERIC - CARDIOLITE
33.0000 | Freq: Once | INTRAVENOUS | Status: AC | PRN
  Administered 2012-03-27: 33 via INTRAVENOUS

## 2012-03-28 ENCOUNTER — Telehealth: Payer: Self-pay | Admitting: *Deleted

## 2012-03-28 ENCOUNTER — Encounter: Payer: Self-pay | Admitting: Physician Assistant

## 2012-03-28 NOTE — Telephone Encounter (Signed)
Message copied by Tarri Fuller on Thu Mar 28, 2012  5:15 PM ------      Message from: [place], [place] T      Created: Thu Mar 28, 2012  5:09 PM       Please inform patient stress test normal.      Tereso Newcomer, PA-C  5:08 PM 03/28/2012

## 2012-03-28 NOTE — Telephone Encounter (Signed)
lmom normal myoview 

## 2012-03-29 NOTE — ED Notes (Signed)
Abscess culture: few diptheroids (corynebacterium species)- no sensitivity.  Pt. had I and D and doxycycline.  12/17 Message sent to Dr. Artis Flock to ask if tx. was adequate. Vassie Moselle 03/29/2012

## 2012-04-09 ENCOUNTER — Emergency Department (INDEPENDENT_AMBULATORY_CARE_PROVIDER_SITE_OTHER): Payer: Medicaid Other

## 2012-04-09 ENCOUNTER — Encounter (HOSPITAL_COMMUNITY): Payer: Self-pay | Admitting: *Deleted

## 2012-04-09 ENCOUNTER — Emergency Department (INDEPENDENT_AMBULATORY_CARE_PROVIDER_SITE_OTHER)
Admission: EM | Admit: 2012-04-09 | Discharge: 2012-04-09 | Disposition: A | Discharge status: Home / Self Care | Payer: Medicaid Other | Source: Home / Self Care | Attending: Family Medicine | Admitting: Family Medicine

## 2012-04-09 DIAGNOSIS — J45901 Unspecified asthma with (acute) exacerbation: Secondary | ICD-10-CM

## 2012-04-09 MED ORDER — IPRATROPIUM BROMIDE 0.02 % IN SOLN
0.5000 mg | Freq: Once | RESPIRATORY_TRACT | Status: AC
  Administered 2012-04-09: 0.5 mg via RESPIRATORY_TRACT

## 2012-04-09 MED ORDER — ALBUTEROL SULFATE (5 MG/ML) 0.5% IN NEBU
INHALATION_SOLUTION | RESPIRATORY_TRACT | Status: AC
  Filled 2012-04-09: qty 1

## 2012-04-09 MED ORDER — METHYLPREDNISOLONE SODIUM SUCC 125 MG IJ SOLR
INTRAMUSCULAR | Status: AC
  Filled 2012-04-09: qty 2

## 2012-04-09 MED ORDER — ALBUTEROL SULFATE HFA 108 (90 BASE) MCG/ACT IN AERS: 1.0000 | INHALATION_SPRAY | Freq: Four times a day (QID) | RESPIRATORY_TRACT | Status: DC | PRN

## 2012-04-09 MED ORDER — METHYLPREDNISOLONE SODIUM SUCC 125 MG IJ SOLR
125.0000 mg | Freq: Once | INTRAMUSCULAR | Status: AC
  Administered 2012-04-09: 125 mg via INTRAMUSCULAR

## 2012-04-09 MED ORDER — ALBUTEROL SULFATE (5 MG/ML) 0.5% IN NEBU
5.0000 mg | INHALATION_SOLUTION | Freq: Once | RESPIRATORY_TRACT | Status: AC
  Administered 2012-04-09: 5 mg via RESPIRATORY_TRACT

## 2012-04-09 MED ORDER — METHYLPREDNISOLONE 4 MG PO KIT: PACK | ORAL | Status: DC

## 2012-04-09 NOTE — ED Notes (Signed)
Pt reports  Symptoms  Of  Wheezing   Cough   Congestion           Difficulty    Breathing  For  Several  Days  -  Pt  States  Her    Inhaler  Has    Expired              She  Is  Also  A  Smoker               She  Is   Is speaking in  Short  sentances   And is  In  Some  Distress

## 2012-04-09 NOTE — ED Provider Notes (Signed)
History     CSN: [id number]  Arrival date & time 04/09/12  1002   First MD Initiated Contact with Patient 04/09/12 1045      Chief Complaint  Patient presents with  . Wheezing    (Consider location/radiation/quality/duration/timing/severity/associated sxs/prior treatment) Patient is a 51 y.o. female presenting with shortness of breath. The history is provided by the patient.  Shortness of Breath  The current episode started yesterday. The onset was sudden. The problem has been gradually worsening. The problem is moderate. Associated symptoms include cough, shortness of breath and wheezing. Exposure to Toxic Fumes: pt smokes 1 ppd. Her past medical history is significant for asthma. There were no sick contacts.    Past Medical History  Diagnosis Date  . Hypertension   . Pain in joint, pelvic region and thigh     back and hip pain  . HLD (hyperlipidemia)   . COPD (chronic obstructive pulmonary disease)   . Tobacco abuse   . Obesity   . Ovarian cyst   . GERD (gastroesophageal reflux disease)   . 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  Marland Kitchen Hx of echocardiogram     a. Echo 4/13:   EF 55-60%.  . Depression   . Hx of cardiovascular stress test     a. ETT-MV 03/2012:   EF 60%, no ischemia      Past Surgical History  Procedure Date  . Total abdominal hysterectomy 07/14/10    No family history on file.  History  Substance Use Topics  . Smoking status: Current Every Day Smoker -- 1.0 packs/day    Types: Cigarettes  . Smokeless tobacco: Never Used  . Alcohol Use: Yes     Comment: occasionally    OB History    Grav Para Term Preterm Abortions TAB SAB Ect Mult Living                  Review of Systems  Constitutional: Negative.   HENT: Negative.   Respiratory: Positive for cough, chest tightness, shortness of breath and wheezing.   Cardiovascular: Negative.   Gastrointestinal: Negative.      Allergies  Ibuprofen  Home Medications   Current Outpatient Rx  Name  Route  Sig  Dispense  Refill  . ALBUTEROL SULFATE HFA 108 (90 BASE) MCG/ACT IN AERS   Inhalation   Inhale 1-2 puffs into the lungs every 6 (six) hours as needed for wheezing.   1 Inhaler   0   . DOXYCYCLINE HYCLATE 100 MG PO CAPS   Oral   Take 1 capsule (100 mg total) by mouth 2 (two) times daily.   20 capsule   0   . METHYLPREDNISOLONE 4 MG PO KIT      follow package directions, start on wed, take until finished.   21 tablet   0   . METOPROLOL SUCCINATE ER 50 MG PO TB24   Oral   Take 1 tablet (50 mg total) by mouth daily.   30 tablet   11   . OMEPRAZOLE 40 MG PO CPDR   Oral   Take 40 mg by mouth daily.         Marland Kitchen PAROXETINE HCL 20 MG PO TABS   Oral   Take 20 mg by mouth daily.         Marland Kitchen PRAVASTATIN SODIUM 20 MG PO TABS   Oral   Take 20 mg by mouth daily.  BP 150/68  Pulse 86  Temp 99.5 F (37.5 C) (Oral)  Resp 28  SpO2 97%  Physical Exam  Nursing note and vitals reviewed. Constitutional: She is oriented to person, place, and time. She appears well-developed and well-nourished.  HENT:  Mouth/Throat: Oropharynx is clear and moist.  Eyes: Pupils are equal, round, and reactive to light.  Neck: Normal range of motion. Neck supple.  Cardiovascular: Regular rhythm and normal heart sounds.   Pulmonary/Chest: She is in respiratory distress. She has wheezes.  Abdominal: Soft. Bowel sounds are normal.  Neurological: She is alert and oriented to person, place, and time.  Skin: Skin is warm and dry.    ED Course  Procedures (including critical care time)  Labs Reviewed - No data to display Dg Chest 2 View  04/09/2012  *RADIOLOGY REPORT*  Clinical Data: Fever, cough and wheezing.  CHEST - 2 VIEW  Comparison: 02/28/2012.  Findings: The heart is borderline enlarged but stable.  There is tortuosity of the thoracic aorta.  The lungs are clear.  No pleural effusion.  The  bony thorax is intact.  IMPRESSION: No acute cardiopulmonary findings.   Original Report Authenticated By: Rudie Meyer, M.D.      1. Asthmatic bronchitis with exacerbation       MDM  X-rays reviewed and report per radiologist.  lungs clear and sx 100% improved after 2nd neb.         Linna Hoff, MD 04/09/12 [phone]

## 2012-04-09 NOTE — ED Notes (Signed)
Pt  Unable  To   Complete  Peak  Flow   Due  To  Poor     Effort  -  She  Does  State  However  She  Is  Breathing  Better

## 2012-04-11 ENCOUNTER — Ambulatory Visit (HOSPITAL_BASED_OUTPATIENT_CLINIC_OR_DEPARTMENT_OTHER): Payer: Medicaid Other | Attending: Physician Assistant

## 2012-04-11 VITALS — Ht 65.0 in | Wt 249.0 lb

## 2012-04-11 DIAGNOSIS — R0609 Other forms of dyspnea: Secondary | ICD-10-CM | POA: Insufficient documentation

## 2012-04-11 DIAGNOSIS — R0683 Snoring: Secondary | ICD-10-CM

## 2012-04-11 DIAGNOSIS — R0989 Other specified symptoms and signs involving the circulatory and respiratory systems: Secondary | ICD-10-CM | POA: Insufficient documentation

## 2012-04-11 DIAGNOSIS — G4733 Obstructive sleep apnea (adult) (pediatric): Secondary | ICD-10-CM

## 2012-04-15 NOTE — ED Notes (Signed)
Dr. Artis Flock said I and D should be sufficient. Michele Owens 04/15/2012

## 2012-04-23 DIAGNOSIS — G4710 Hypersomnia, unspecified: Secondary | ICD-10-CM

## 2012-04-23 DIAGNOSIS — G4730 Sleep apnea, unspecified: Secondary | ICD-10-CM

## 2012-04-24 NOTE — Procedures (Signed)
Michele Owens, Michele Owens               ACCOUNT NO.:  [id number]  MEDICAL RECORD NO.:  [id number]          PATIENT TYPE:  OUT  LOCATION:  SLEEP CENTER                 FACILITY:  [facility]  PHYSICIAN:  Barbaraann Share, MD,FCCPDATE OF BIRTH:  01-05-60  DATE OF STUDY:  04/11/2012                           NOCTURNAL POLYSOMNOGRAM  REFERRING PHYSICIAN:  Tereso Newcomer, PA-C  INDICATION FOR STUDY:  Hypersomnia with sleep apnea.  EPWORTH SLEEPINESS SCORE:  9.  MEDICATIONS:  SLEEP ARCHITECTURE:  The patient had total sleep time of 273 minutes with no slow-wave sleep and only 3 minutes of REM.  Sleep onset latency was normal at 2 minutes and REM onset was very prolonged at 314 minutes. Sleep efficiency was moderately reduced at 76%.  RESPIRATORY DATA:  The patient was found to have 4 obstructive apneas and 127 obstructive hypopneas, giving her an apnea-hypopnea index of 29 events per hour.  The events occurred in all body positions and there was loud snoring noted throughout.  OXYGEN DATA:  There was O2 desaturation as low as 80% during the night with the patient's obstructive events.  However, it should be noted that she was placed on 1 L of oxygen at 2352 hours due to persistent oxygen desaturation.  CARDIAC DATA:  Frequent PVCs noted throughout.  MOVEMENT-PARASOMNIA:  The patient had no significant leg jerks or other abnormal behaviors noted.  IMPRESSIONS-RECOMMENDATIONS: 1. Moderate obstructive sleep apnea/hypopnea syndrome, with an apnea-     hypopnea index of 29 events per hour and oxygen desaturation as low     as 80%.  Treatment for this degree of sleep apnea should focus on a     trial of weight loss alone, upper airway surgery, dental appliance,     and also continuous positive airway pressure.  Clinical correlation     is suggested. 2. The patient was placed on 1 L of oxygen at 2352 hours because of     consistent oxygen desaturation.  It is     unclear if she will need  nocturnal oxygen once her sleep disordered     breathing is adequately treated 3. Frequent premature ventricular contractions noted throughout the     night.     Barbaraann Share, MD,FCCP Diplomate, American Board of Sleep Medicine    [facility]/MEDQ  D:  04/23/2012 19:15:29  T:  04/24/2012 02:17:47  Job:  [id number]   Reviewed  Patient will be referred to sleep medicine for management. Tereso Newcomer, PA-C  3:44 PM 04/25/2012

## 2012-04-25 ENCOUNTER — Telehealth: Payer: Self-pay | Admitting: *Deleted

## 2012-04-25 ENCOUNTER — Encounter: Payer: Self-pay | Admitting: Physician Assistant

## 2012-04-25 ENCOUNTER — Telehealth: Payer: Self-pay | Admitting: Physician Assistant

## 2012-04-25 DIAGNOSIS — G4733 Obstructive sleep apnea (adult) (pediatric): Secondary | ICD-10-CM

## 2012-04-25 NOTE — Telephone Encounter (Signed)
Patient has moderate sleep apnea. Please refer to Sleep Medicine/Pulmonology for eval and treatment. Tereso Newcomer, PA-C  3:43 PM 04/25/2012

## 2012-04-25 NOTE — Telephone Encounter (Signed)
lmptcb to go over results and needs eval to pulmo/sleep medicine for moderate sleep apnea, I will put in referral today

## 2012-04-25 NOTE — Telephone Encounter (Signed)
Refer to pulmonology for further eval for moderate sleep apnea, I will put in referral today

## 2012-04-25 NOTE — Progress Notes (Signed)
Reviewed. Patient will be referred to Sleep Medicine for management. Tereso Newcomer, PA-C  3:41 PM 04/25/2012

## 2012-05-10 ENCOUNTER — Encounter: Payer: Self-pay | Admitting: Pulmonary Disease

## 2012-05-10 ENCOUNTER — Ambulatory Visit (INDEPENDENT_AMBULATORY_CARE_PROVIDER_SITE_OTHER): Payer: Medicaid Other | Admitting: Pulmonary Disease

## 2012-05-10 VITALS — BP 138/90 | HR 84 | Temp 98.6°F | Ht 65.0 in | Wt 255.0 lb

## 2012-05-10 DIAGNOSIS — Z23 Encounter for immunization: Secondary | ICD-10-CM

## 2012-05-10 DIAGNOSIS — G4733 Obstructive sleep apnea (adult) (pediatric): Secondary | ICD-10-CM

## 2012-05-10 NOTE — Progress Notes (Signed)
  Subjective:    Patient ID: Michele Owens, female    DOB: [birth year], 52 y.o.   MRN: [id number]  HPI The patient is a 52 year old female who I have been asked to see for management of obstructive sleep apnea.  She has had a recent sleep study that showed moderate OSA, with an AHI of 29 events per hour.  The patient has been noted to have loud snoring, and although she does not mention anyone commenting on an abnormal breathing pattern during sleep, she describes classic choking arousals.  She has frequent awakenings at night, and is not rested in the mornings upon arising.  She notes significant sleepiness during the day and night with periods of inactivity.  She does not drive currently.  She states that her weight is up about 13 pounds over the last few years, and her Epworth score today is 7.  Sleep Questionnaire: What time do you typically go to bed?( Between what hours) 11p-12am How long does it take you to fall asleep? 3-4hrs How many times during the night do you wake up? 4 What time do you get out of bed to start your day? 1100 Do you drive or operate heavy machinery in your occupation? No How much has your weight changed (up or down) over the past two years? (In pounds) 13 lb (5.897 kg) Have you ever had a sleep study before? Yes If yes, location of study? [facility] If yes, date of study? 04/11/12 Do you currently use CPAP? No Do you wear oxygen at any time? No    Review of Systems  Constitutional: Negative for fever and unexpected weight change.  HENT: Negative for ear pain, nosebleeds, congestion, sore throat, rhinorrhea, sneezing, trouble swallowing, dental problem, postnasal drip and sinus pressure.   Eyes: Negative for redness and itching.  Respiratory: Positive for wheezing ( if she smokes alot of cigs). Negative for cough, chest tightness and shortness of breath.   Cardiovascular: Positive for leg swelling. Negative for palpitations.  Gastrointestinal: Negative for nausea and  vomiting.  Genitourinary: Negative for dysuria.  Musculoskeletal: Negative for joint swelling.  Skin: Negative for rash.  Neurological: Positive for headaches.  Hematological: Does not bruise/bleed easily.  Psychiatric/Behavioral: Negative for dysphoric mood. The patient is nervous/anxious.        Objective:   Physical Exam Constitutional:  Morbidly obese female, no acute distress  HENT:  Nares patent without discharge, turbinate hypertrophy  Oropharynx without exudate, palate and uvula are thick and elongated.   Eyes:  Perrla, eomi, no scleral icterus  Neck:  No JVD, no TMG  Cardiovascular:  Normal rate, regular rhythm, no rubs or gallops.  2/6 sem        Intact distal pulses  Pulmonary :  Normal breath sounds, no stridor or respiratory distress   No rales, rhonchi, or wheezing  Abdominal:  Soft, nondistended, bowel sounds present.  No tenderness noted.   Musculoskeletal:  mild lower extremity edema noted.  Lymph Nodes:  No cervical lymphadenopathy noted  Skin:  No cyanosis noted  Neurologic:  Alert, appropriate, moves all 4 extremities without obvious deficit.         Assessment & Plan:

## 2012-05-10 NOTE — Assessment & Plan Note (Signed)
The patient has moderate obstructive sleep apnea by her recent sleep study, and is clearly symptomatic during her waking hours and has comorbid underlying heart issues as well.  I had a long discussion with her about the pathophysiology of sleep apnea, and have recommended a trial of CPAP while working on weight loss.  The patient is agreeable to this approach. I will set the patient up on cpap at a moderate pressure level to allow for desensitization, and will troubleshoot the device over the next 4-6weeks if needed.  The pt is to call me if having issues with tolerance.  Will then optimize the pressure once patient is able to wear cpap on a consistent basis.

## 2012-05-10 NOTE — Patient Instructions (Addendum)
Will set you up on cpap for treatment of your sleep apnea.  Please call if having trouble wearing so we can talk about it. Work on weight loss followup with me in 6 weeks.

## 2012-06-19 ENCOUNTER — Ambulatory Visit: Payer: Medicaid Other | Admitting: Cardiology

## 2012-06-21 ENCOUNTER — Ambulatory Visit: Payer: Medicaid Other | Admitting: Pulmonary Disease

## 2012-06-26 ENCOUNTER — Encounter: Payer: Self-pay | Admitting: Cardiology

## 2012-06-26 ENCOUNTER — Telehealth: Payer: Self-pay | Admitting: Cardiology

## 2012-06-26 NOTE — Telephone Encounter (Signed)
Opened in error      Opened in error

## 2012-07-22 ENCOUNTER — Other Ambulatory Visit: Payer: Self-pay

## 2012-07-22 MED ORDER — PRAVASTATIN SODIUM 20 MG PO TABS: 20.0000 mg | ORAL_TABLET | Freq: Every day | ORAL | Status: DC

## 2012-07-29 ENCOUNTER — Encounter: Payer: Self-pay | Admitting: *Deleted

## 2012-08-23 ENCOUNTER — Telehealth: Payer: Self-pay | Admitting: Pulmonary Disease

## 2012-08-23 ENCOUNTER — Encounter: Payer: Self-pay | Admitting: *Deleted

## 2012-08-23 NOTE — Telephone Encounter (Signed)
I spoke with the pt and she is asking that we fax a letter to transportation at [phone] stating she was here in the office at last visit. I advised the pt that I will take care of thsi now. Letter faxed.Carron Curie, CMA

## 2012-10-07 ENCOUNTER — Telehealth: Payer: Self-pay | Admitting: Cardiology

## 2012-10-07 ENCOUNTER — Telehealth: Payer: Self-pay | Admitting: *Deleted

## 2012-10-07 NOTE — Telephone Encounter (Signed)
Spoke with patient. Pt states the form should have been left at her PCP office to complete. Pt has appt with her PCP on Wednesday and requested that I leave the form at the front desk to be picked up and taken to her PCP to complete.

## 2012-10-07 NOTE — Telephone Encounter (Signed)
Walk in patient form received on 6.27.2014 requesting for Dr. Shirlee Latch to complete a Home Health Care form.  Given to [place] on 6.30.2014/djc

## 2012-11-29 ENCOUNTER — Telehealth: Payer: Self-pay

## 2012-11-29 NOTE — Telephone Encounter (Signed)
Patient left message on triage voicemail: please return call (no other details given)  I called patient and informed her that she has contacted the wrong facility for we do not have documentation of her seeing any MD's at this facility. Patient indicated she is trying to contact Cardiology, patient was given the number to Cardiology

## 2013-02-08 IMAGING — CR DG LUMBAR SPINE COMPLETE 4+V
5 series · 5 of 5 positions shown · non-contrast
Comparison: 05/02/2007

CLINICAL DATA: Fall

LUMBAR SPINE - COMPLETE 4+ VIEW

[view not recorded (1 of 5)]
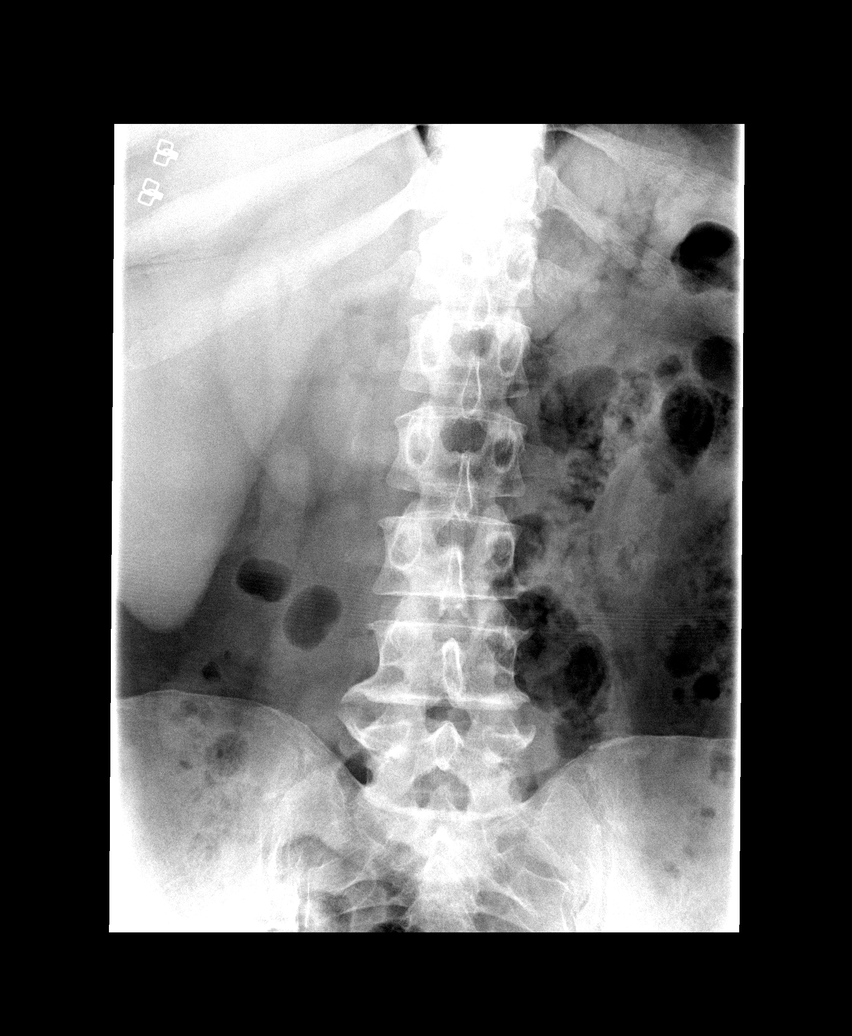

[view not recorded (2 of 5)]
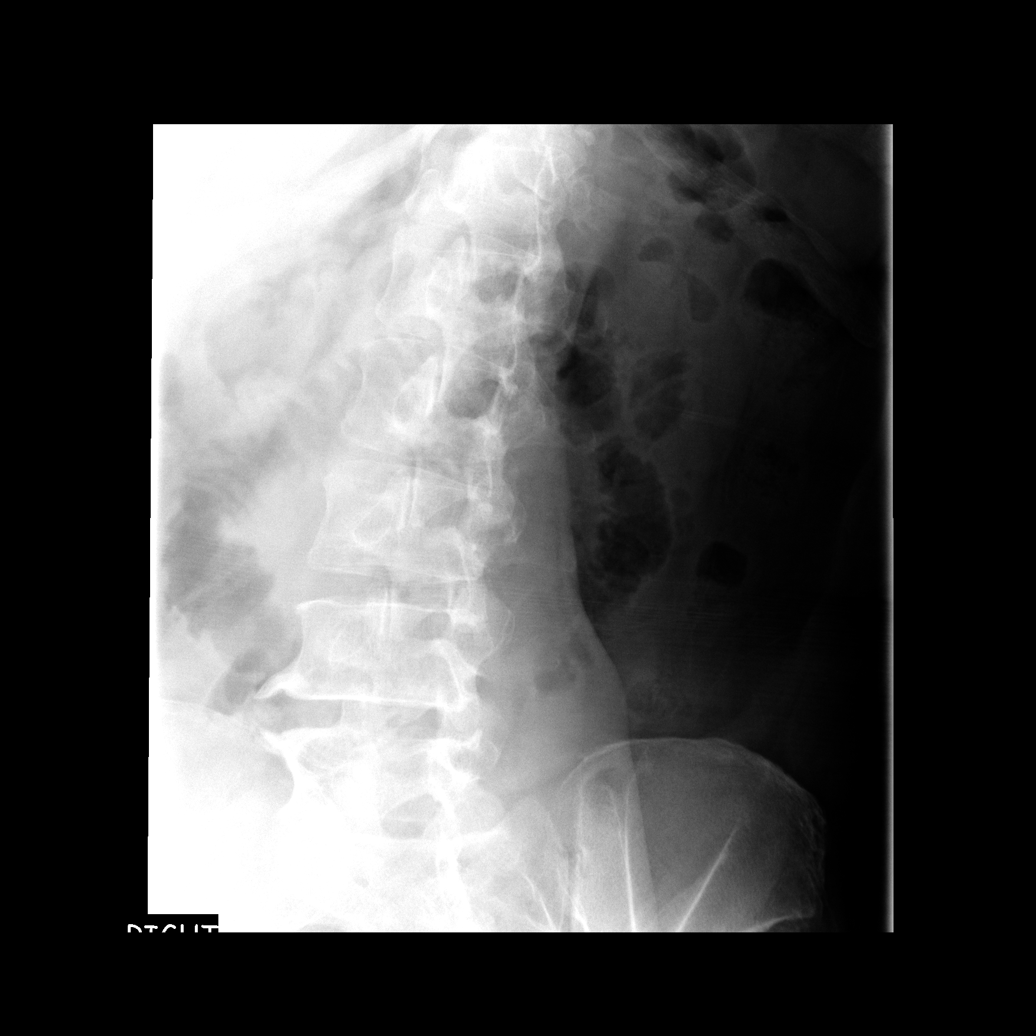

[view not recorded (3 of 5)]
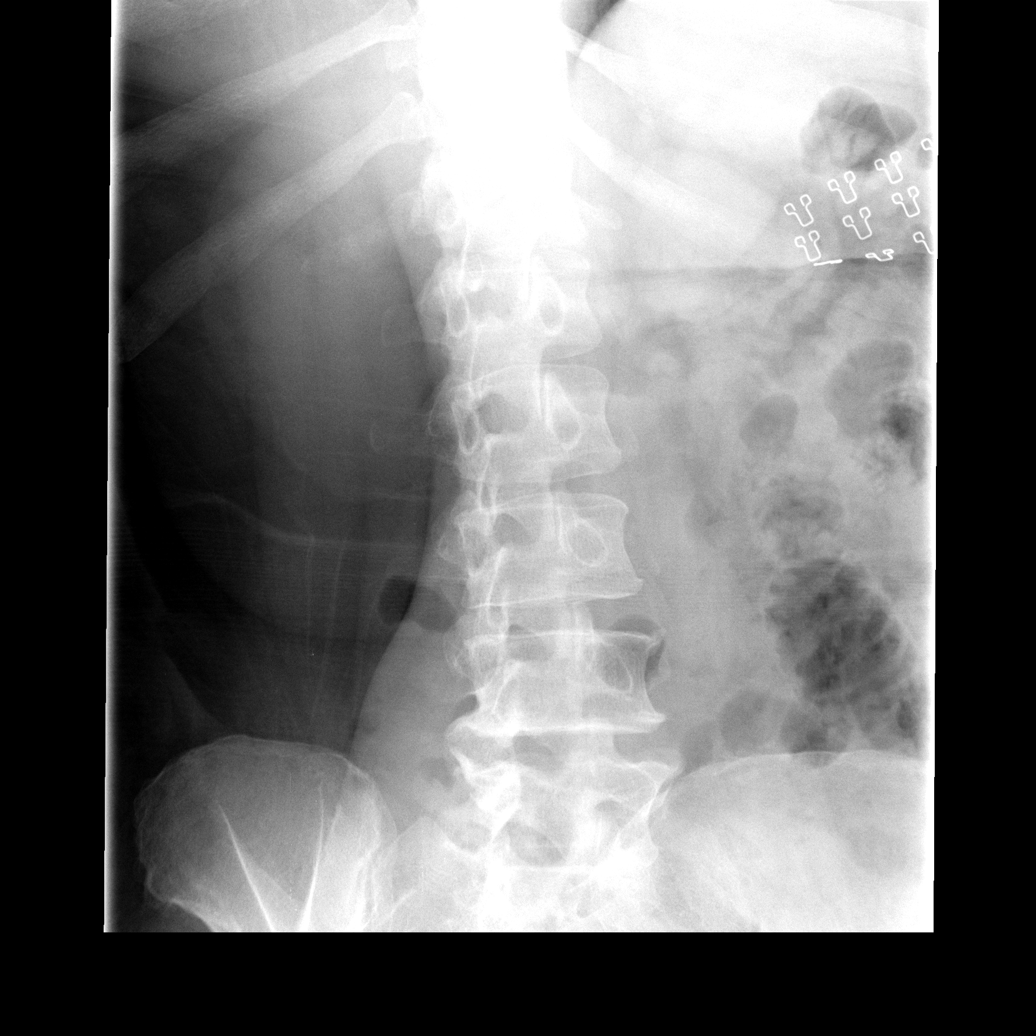

[view not recorded (4 of 5)]
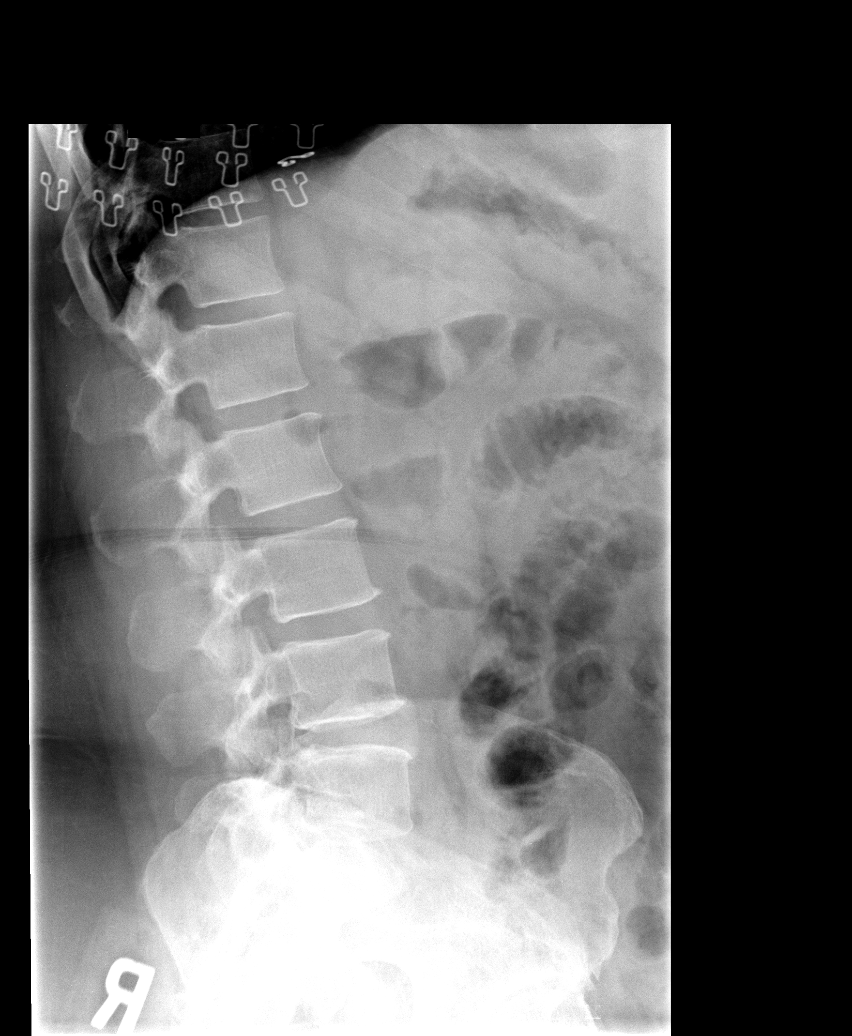

[view not recorded (5 of 5)]
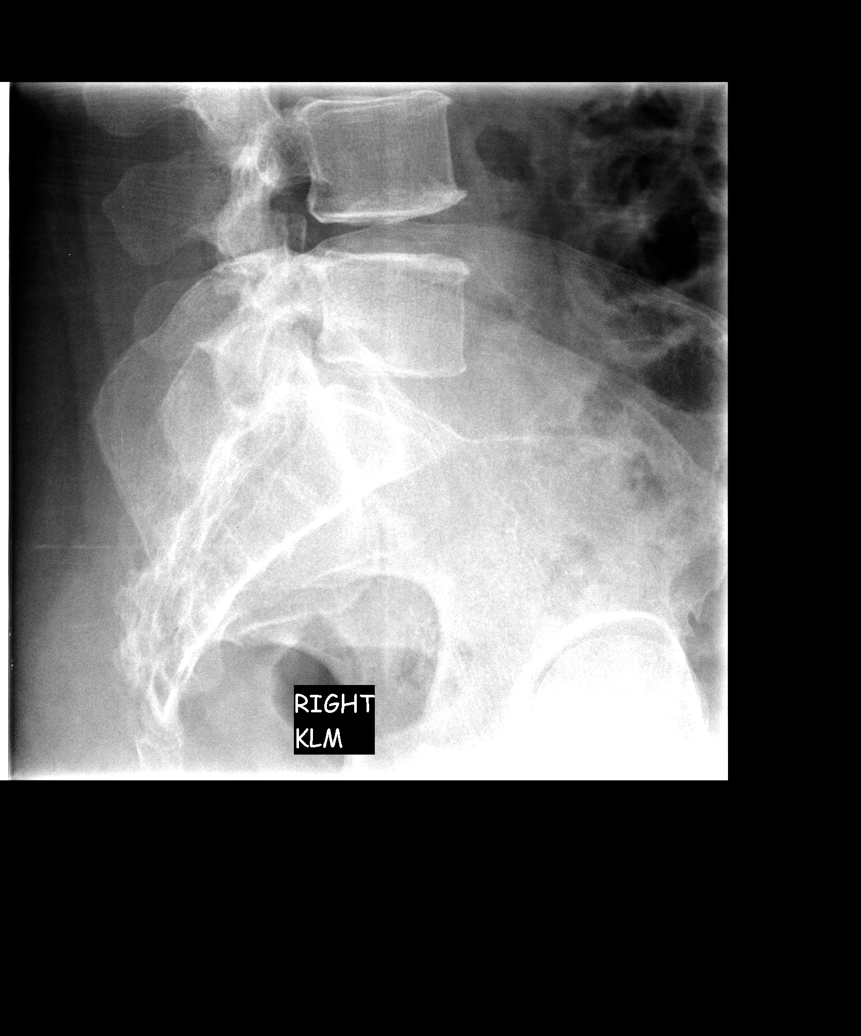

[5 of 5 positions shown; findings below may reference images not displayed]

FINDINGS: Anatomic alignment.  Mild narrowing of the L4-5 and L5 S1
discs.  Anterior osteophytes are seen throughout the lumbar spine.
No definite acute fracture.
IMPRESSION: No acute bony pathology.  Degenerative change.

## 2013-02-14 ENCOUNTER — Telehealth: Payer: Self-pay | Admitting: Cardiology

## 2013-02-14 NOTE — Telephone Encounter (Signed)
New problem   Pt need to speak to nurse concerning her having blackout spells. Please call.

## 2013-02-14 NOTE — Telephone Encounter (Signed)
PT  CALLED  COMPLAINING  OF HAVING  HAD 2 EPISODES  OF BLACKING OUT  REVIEWED   LAST OFFICE  NOTE HAS NOT NOTED   HAVING EPISODES  IN PAST   WAS  VERY AGITATED  ON PHONE NOT  ABLE TO GET A LOT  OF INFO  NO SHOWED  FOR  MARCH  APPT  HAS  APPT  MON  11-10- 14  WITH DR [facility]  WILL DISCUSS  OPTIONS AT THAT   VISIT .[name]

## 2013-02-17 ENCOUNTER — Encounter (HOSPITAL_COMMUNITY): Payer: Self-pay | Admitting: Emergency Medicine

## 2013-02-17 ENCOUNTER — Emergency Department (HOSPITAL_COMMUNITY)
Admission: EM | Admit: 2013-02-17 | Discharge: 2013-02-17 | Disposition: A | Discharge status: Home / Self Care | Payer: Medicaid Other | Attending: Emergency Medicine | Admitting: Emergency Medicine

## 2013-02-17 ENCOUNTER — Emergency Department (HOSPITAL_COMMUNITY): Payer: Medicaid Other

## 2013-02-17 ENCOUNTER — Encounter: Payer: Self-pay | Admitting: Cardiology

## 2013-02-17 ENCOUNTER — Ambulatory Visit (INDEPENDENT_AMBULATORY_CARE_PROVIDER_SITE_OTHER): Payer: Medicaid Other | Admitting: Cardiology

## 2013-02-17 VITALS — BP 108/68 | HR 80 | Ht 65.0 in | Wt 255.0 lb

## 2013-02-17 DIAGNOSIS — R55 Syncope and collapse: Secondary | ICD-10-CM

## 2013-02-17 DIAGNOSIS — E785 Hyperlipidemia, unspecified: Secondary | ICD-10-CM | POA: Insufficient documentation

## 2013-02-17 DIAGNOSIS — F17200 Nicotine dependence, unspecified, uncomplicated: Secondary | ICD-10-CM | POA: Insufficient documentation

## 2013-02-17 DIAGNOSIS — I471 Supraventricular tachycardia, unspecified: Secondary | ICD-10-CM

## 2013-02-17 DIAGNOSIS — F3289 Other specified depressive episodes: Secondary | ICD-10-CM | POA: Insufficient documentation

## 2013-02-17 DIAGNOSIS — I10 Essential (primary) hypertension: Secondary | ICD-10-CM | POA: Insufficient documentation

## 2013-02-17 DIAGNOSIS — I498 Other specified cardiac arrhythmias: Secondary | ICD-10-CM

## 2013-02-17 DIAGNOSIS — F329 Major depressive disorder, single episode, unspecified: Secondary | ICD-10-CM | POA: Insufficient documentation

## 2013-02-17 DIAGNOSIS — R519 Headache, unspecified: Secondary | ICD-10-CM

## 2013-02-17 DIAGNOSIS — Z8669 Personal history of other diseases of the nervous system and sense organs: Secondary | ICD-10-CM | POA: Insufficient documentation

## 2013-02-17 DIAGNOSIS — R51 Headache: Secondary | ICD-10-CM | POA: Insufficient documentation

## 2013-02-17 DIAGNOSIS — E669 Obesity, unspecified: Secondary | ICD-10-CM

## 2013-02-17 DIAGNOSIS — K219 Gastro-esophageal reflux disease without esophagitis: Secondary | ICD-10-CM | POA: Insufficient documentation

## 2013-02-17 DIAGNOSIS — Z8742 Personal history of other diseases of the female genital tract: Secondary | ICD-10-CM | POA: Insufficient documentation

## 2013-02-17 DIAGNOSIS — R079 Chest pain, unspecified: Secondary | ICD-10-CM

## 2013-02-17 DIAGNOSIS — G4733 Obstructive sleep apnea (adult) (pediatric): Secondary | ICD-10-CM

## 2013-02-17 DIAGNOSIS — J4489 Other specified chronic obstructive pulmonary disease: Secondary | ICD-10-CM | POA: Insufficient documentation

## 2013-02-17 DIAGNOSIS — IMO0001 Reserved for inherently not codable concepts without codable children: Secondary | ICD-10-CM

## 2013-02-17 DIAGNOSIS — J449 Chronic obstructive pulmonary disease, unspecified: Secondary | ICD-10-CM | POA: Insufficient documentation

## 2013-02-17 DIAGNOSIS — Z79899 Other long term (current) drug therapy: Secondary | ICD-10-CM | POA: Insufficient documentation

## 2013-02-17 LAB — BASIC METABOLIC PANEL
Calcium: 9.6 mg/dL (ref 8.4–10.5)
Creatinine, Ser: 0.5 mg/dL (ref 0.50–1.10)
GFR calc non Af Amer: >90 mL/min (ref >90)
Sodium: 138 mEq/L (ref 135–145)

## 2013-02-17 LAB — CBC WITH DIFFERENTIAL/PLATELET
Basophils Absolute: 0 10*3/uL (ref 0.0–0.1)
Basophils Relative: 0 % (ref 0–1)
Eosinophils Absolute: 0.2 10*3/uL (ref 0.0–0.7)
Eosinophils Relative: 1 % (ref 0–5)
HCT: 42.7 % (ref 36.0–46.0)
MCH: 28.4 pg (ref 26.0–34.0)
MCHC: 31.9 g/dL (ref 30.0–36.0)
MCV: 89.1 fL (ref 78.0–100.0)
Monocytes Absolute: 0.9 10*3/uL (ref 0.1–1.0)
Platelets: 335 10*3/uL (ref 150–400)
RDW: 14.3 % (ref 11.5–15.5)
WBC: 15.3 10*3/uL — ABNORMAL HIGH (ref 4.0–10.5)

## 2013-02-17 MED ORDER — ALBUTEROL SULFATE HFA 108 (90 BASE) MCG/ACT IN AERS
2.0000 | INHALATION_SPRAY | Freq: Four times a day (QID) | RESPIRATORY_TRACT | Status: DC | PRN
  Administered 2013-02-17: 2 via RESPIRATORY_TRACT
  Filled 2013-02-17: qty 6.7

## 2013-02-17 MED ORDER — ONDANSETRON HCL 4 MG/2ML IJ SOLN
4.0000 mg | Freq: Once | INTRAMUSCULAR | Status: AC
  Administered 2013-02-17: 4 mg via INTRAVENOUS
  Filled 2013-02-17: qty 2

## 2013-02-17 MED ORDER — HYDROMORPHONE HCL PF 1 MG/ML IJ SOLN
1.0000 mg | Freq: Once | INTRAMUSCULAR | Status: AC
  Administered 2013-02-17: 1 mg via INTRAVENOUS
  Filled 2013-02-17: qty 1

## 2013-02-17 NOTE — Progress Notes (Addendum)
Patient ID: Michele Owens, female   DOB: 1961-01-27, 52 y.o.   MRN: [id number] PCP: Dr. Mikeal Hawthorne  52 yo with history of HTN, obesity, OSA, hyperlipidemia, and SVT presents for cardiology evaluation.  She has a history of atypical chest pain.  ETT-Cardiolite in 12/13 was normal.  Echo in 4/13 showed normal EF.  Lately, she has had more episodes of tachypalpitations.  She will feel an accelerated HR for a few minutes.  She does not get lightheaded or pass out with these spells.  However, last week, she was walking into her bathroom and suddenly developed severe lightheadedness and passed out.  She thinks it was for around 5 minutes.  She did not seek medical attention after the event.  She did not feel palpitations with this event.  She passed out one time prior (was a similar event) about 6-8 months ago.  I checked orthostatics today in the office: she was not orthostatic.  No exertional chest pain.  She is chronically short of breath walking up steps.  She has gained about 19 lbs since last appointment in this office.  She still smokes about 1/2 ppd . She has OSA but is not on CPAP.   Main problem today is actually a frontal headache.  Lights bother here.  She says that her PCP has told her that the HA is from migraines.  She says that she has had a constant headache for almost a week now.  She is in a lot of pain today.  No vomiting, +nausea.  No focal neurologic symptoms.    ECG: NSR, normal (not pre-excited)  Labs (11/12): K 3.6, creatinine 0.73 Labs (12/13): K 3.9, creatinine 0.9, BNP 22  PMH: 1. Back and hip pain 2. HTN 3. Obesity 4. GERD 5. Hyperlipidemia 6. Depression 7. COPD: Active smoker 8. 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  10. Chest pain: Atypical.  ETT-Cardiolite (12/13) with EF 60%, no ischemia/infarction.  11. Syncope: Uncertain etiology.  Echo (4/13) with EF 55-60%, no significant valvular abnormalities.  12. OSA   SH: Smoker.   Lives alone.  Does not use drugs or ETOH.   FH: Father with cancer, mother with diabetes, brother with fen-phen related valvulopathy  ROS: All systems reviewed and negative except as per HPI.   Current Outpatient Prescriptions  Medication Sig Dispense Refill  . albuterol (PROVENTIL HFA;VENTOLIN HFA) 108 (90 BASE) MCG/ACT inhaler Inhale 1-2 puffs into the lungs every 6 (six) hours as needed for wheezing.  1 Inhaler  0  . metoprolol succinate (TOPROL-XL) 50 MG 24 hr tablet Take 1 tablet (50 mg total) by mouth daily.  30 tablet  11  . omeprazole (PRILOSEC) 40 MG capsule Take 40 mg by mouth daily.      Marland Kitchen PARoxetine (PAXIL) 20 MG tablet Take 20 mg by mouth daily.      . pravastatin (PRAVACHOL) 20 MG tablet Take 1 tablet (20 mg total) by mouth daily.  30 tablet  2   No current facility-administered medications for this visit.    BP 108/68  Pulse 80  Ht 5\' 5"  (1.651 m)  Wt 115.667 kg (255 lb)  BMI 42.43 kg/m2 General: NAD, obese.  Neck: No JVD, no thyromegaly or thyroid nodule.  Lungs: Clear to auscultation bilaterally with normal respiratory effort. CV: Nondisplaced PMI.  Heart regular S1/S2, no S3/S4, 1/6 SEM RUSB.  No peripheral edema.  No carotid bruit.  Normal pedal pulses.  Abdomen: Soft, nontender, no hepatosplenomegaly,  no distention.  Skin: Intact without lesions or rashes.  Neurologic: Alert and oriented x 3.  Psych: Normal affect. Muscle strength normal and symmetric.  CN 2-12 intact.  Extremities: No clubbing or cyanosis.   Assessment/Plan: 1. Headache: This has been constant for about a week (waxes and wanes).  Possible migraine.  She is in a lot of pain today and does not think she can go home.  I am going to have to have EMS take her over to the ER.  She will need a head CT as this has not been done in the past.   2. Chest pain: Atypical, suspect noncardiac.  She had a normal Cardiolite in 12/13.  3. Obesity: Needs weight loss.  Will need aggressive diet/exercise.  4.  Syncope: Not orthostatic.  She did not have tachypalpitations prior to syncope, just lightheadedness. However, she has frequent spells where she feels rapid heart rate.  She has a history of SVT.  I will have her wear a 30 day monitor.  Continue Toprol XL.  5. OSA: She wants to try to get back on CPAP.  She has not used it for over a year.  I will refer her back to Dr. Shelle Iron.   6. Smoking: I strongly encouraged her to quit smoking.   Followup in 6 wks.   Marca Ancona 02/17/2013 5:24 PM

## 2013-02-17 NOTE — ED Notes (Signed)
Bed: WA13 Expected date:  Expected time:  Means of arrival:  Comments: ems  

## 2013-02-17 NOTE — ED Notes (Signed)
MD at bedside. 

## 2013-02-17 NOTE — Patient Instructions (Addendum)
   Your physician has recommended that you wear an event monitor. Event monitors are medical devices that record the heart's electrical activity. Doctors most often [place] these monitors to diagnose arrhythmias. Arrhythmias are problems with the speed or rhythm of the heartbeat. The monitor is a small, portable device. You can wear one while you do your normal daily activities. This is usually used to diagnose what is causing palpitations/syncope (passing out). 30 Day event monitor  You have been referred to Dr Shelle Iron for evaluation to get back on CPAP.   Your physician recommends that you schedule a follow-up appointment in: 6 weeks with Dr Shirlee Latch.  Tylenol 500mg  ( 2 tablets) PO given at 4:45PM.   Pt by EMS to [facility] ED due to  severe headache.

## 2013-02-17 NOTE — Progress Notes (Signed)
Patient ID: Michele Owens, female   DOB: 07/19/1960, 52 y.o.   MRN: [id number]

## 2013-02-17 NOTE — ED Provider Notes (Signed)
CSN: [id number]     Arrival date & time 02/17/13  1724 History   First MD Initiated Contact with Patient 02/17/13 1740     Chief Complaint  Patient presents with  . Migraine   (Consider location/radiation/quality/duration/timing/severity/associated sxs/prior Treatment) Patient is a 52 y.o. female presenting with migraines. The history is provided by the patient (the pt complains of a headache). No language interpreter was used.  Migraine This is a new problem. The current episode started 12 to 24 hours ago. The problem occurs constantly. The problem has not changed since onset.Associated symptoms include headaches. Pertinent negatives include no chest pain and no abdominal pain. Nothing aggravates the symptoms. Nothing relieves the symptoms. The treatment provided mild relief.    Past Medical History  Diagnosis Date  . Hypertension   . Pain in joint, pelvic region and thigh     back and hip pain  . HLD (hyperlipidemia)   . COPD (chronic obstructive pulmonary disease)   . Tobacco abuse   . Obesity   . Ovarian cyst   . GERD (gastroesophageal reflux disease)   . 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  Marland Kitchen Hx of echocardiogram     a. Echo 4/13:   EF 55-60%.  . Depression   . Hx of cardiovascular stress test     a. ETT-MV 03/2012:   EF 60%, no ischemia    . OSA (obstructive sleep apnea)     a. sleep study 1/14:  mod OSA with API of 29/hr and O2 sat nadir of 80%   Past Surgical History  Procedure Laterality Date  . Total abdominal hysterectomy  07/14/10   Family History  Problem Relation Age of Onset  . Cancer Father    History  Substance Use Topics  . Smoking status: Current Every Day Smoker -- 1.00 packs/day for 39 years    Types: Cigarettes  . Smokeless tobacco: Never Used  . Alcohol Use: Yes     Comment: occasionally   OB History   Grav Para Term Preterm Abortions TAB SAB Ect Mult Living                  Review of Systems  Constitutional: Negative for appetite change and fatigue.  HENT: Negative for congestion, ear discharge and sinus pressure.   Eyes: Negative for discharge.  Respiratory: Negative for cough.   Cardiovascular: Negative for chest pain.  Gastrointestinal: Negative for abdominal pain and diarrhea.  Genitourinary: Negative for frequency and hematuria.  Musculoskeletal: Negative for back pain.  Skin: Negative for rash.  Neurological: Positive for headaches. Negative for seizures.  Psychiatric/Behavioral: Negative for hallucinations.    Allergies  Ibuprofen  Home Medications   Current Outpatient Rx  Name  Route  Sig  Dispense  Refill  . albuterol (PROVENTIL HFA;VENTOLIN HFA) 108 (90 BASE) MCG/ACT inhaler   Inhalation   Inhale 1-2 puffs into the lungs every 6 (six) hours as needed for wheezing.   1 Inhaler   0   . metoprolol succinate (TOPROL-XL) 50 MG 24 hr tablet   Oral   Take 1 tablet (50 mg total) by mouth daily.   30 tablet   11   . omeprazole (PRILOSEC) 40 MG capsule   Oral   Take 40 mg by mouth daily.         Marland Kitchen PARoxetine (PAXIL) 20 MG tablet   Oral   Take 20 mg by mouth daily.         Marland Kitchen  pravastatin (PRAVACHOL) 20 MG tablet   Oral   Take 1 tablet (20 mg total) by mouth daily.   30 tablet   2     Please call to schedule a follow up visit. [phone] ...    BP 135/78  Pulse 69  Temp(Src) 97.8 F (36.6 C) (Oral)  Resp 20  SpO2 68% Physical Exam  Constitutional: She is oriented to person, place, and time. She appears well-developed.  HENT:  Head: Normocephalic.  Eyes: Conjunctivae and EOM are normal. No scleral icterus.  Neck: Neck supple. No thyromegaly present.  Cardiovascular: Normal rate and regular rhythm.  Exam reveals no gallop and no friction rub.   No murmur heard. Pulmonary/Chest: No stridor. She has no wheezes. She has no rales. She exhibits no tenderness.  Abdominal: She exhibits no distension. There is no tenderness.  There is no rebound.  Musculoskeletal: Normal range of motion. She exhibits no edema.  Lymphadenopathy:    She has no cervical adenopathy.  Neurological: She is oriented to person, place, and time. She exhibits normal muscle tone. Coordination normal.  Skin: No rash noted. No erythema.  Psychiatric: She has a normal mood and affect. Her behavior is normal.    ED Course  Procedures (including critical care time) Labs Review Labs Reviewed  CBC WITH DIFFERENTIAL - Abnormal; Notable for the following:    WBC 15.3 (*)    Neutro Abs 11.2 (*)    All other components within normal limits  BASIC METABOLIC PANEL   Imaging Review Ct Head Wo Contrast  02/17/2013   CLINICAL DATA:  Left-sided headache and dizziness.  EXAM: CT HEAD WITHOUT CONTRAST  TECHNIQUE: Contiguous axial images were obtained from the base of the skull through the vertex without intravenous contrast.  COMPARISON:  None.  FINDINGS: The brain has a normal appearance without evidence of malformation, atrophy, old or acute infarction, mass lesion, hemorrhage, hydrocephalus or extra-axial collection. The calvarium appears normal. Visualized sinuses, middle ears and mastoids are clear.  IMPRESSION: Normal head CT   Electronically Signed   By: Paulina Fusi M.D.   On: 02/17/2013 19:32    EKG Interpretation   None       MDM   1. Headache        Benny Lennert, MD 02/17/13 2011

## 2013-02-17 NOTE — ED Notes (Addendum)
Patient with Hx of chronic headaches x 1 year reports to ED for left anterior headache, after visiting PCP, and weakness and orthostatic hypotension. Pt states that she had a syncopal episode last week and has had racing heartbeats, pt states that PCP believes patient had a panic attack.

## 2013-02-17 NOTE — ED Notes (Signed)
Per EMS pt brought here from PCP for further evaluation, pt c/o migraine headache

## 2013-02-18 IMAGING — CR DG HIP (WITH OR WITHOUT PELVIS) 2-3V*L*
3 series · 3 of 3 positions shown · non-contrast
Comparison: None.

CLINICAL DATA: Status post fall.  Pain.

LEFT HIP - COMPLETE 2+ VIEW

[t pelvis a.p.]
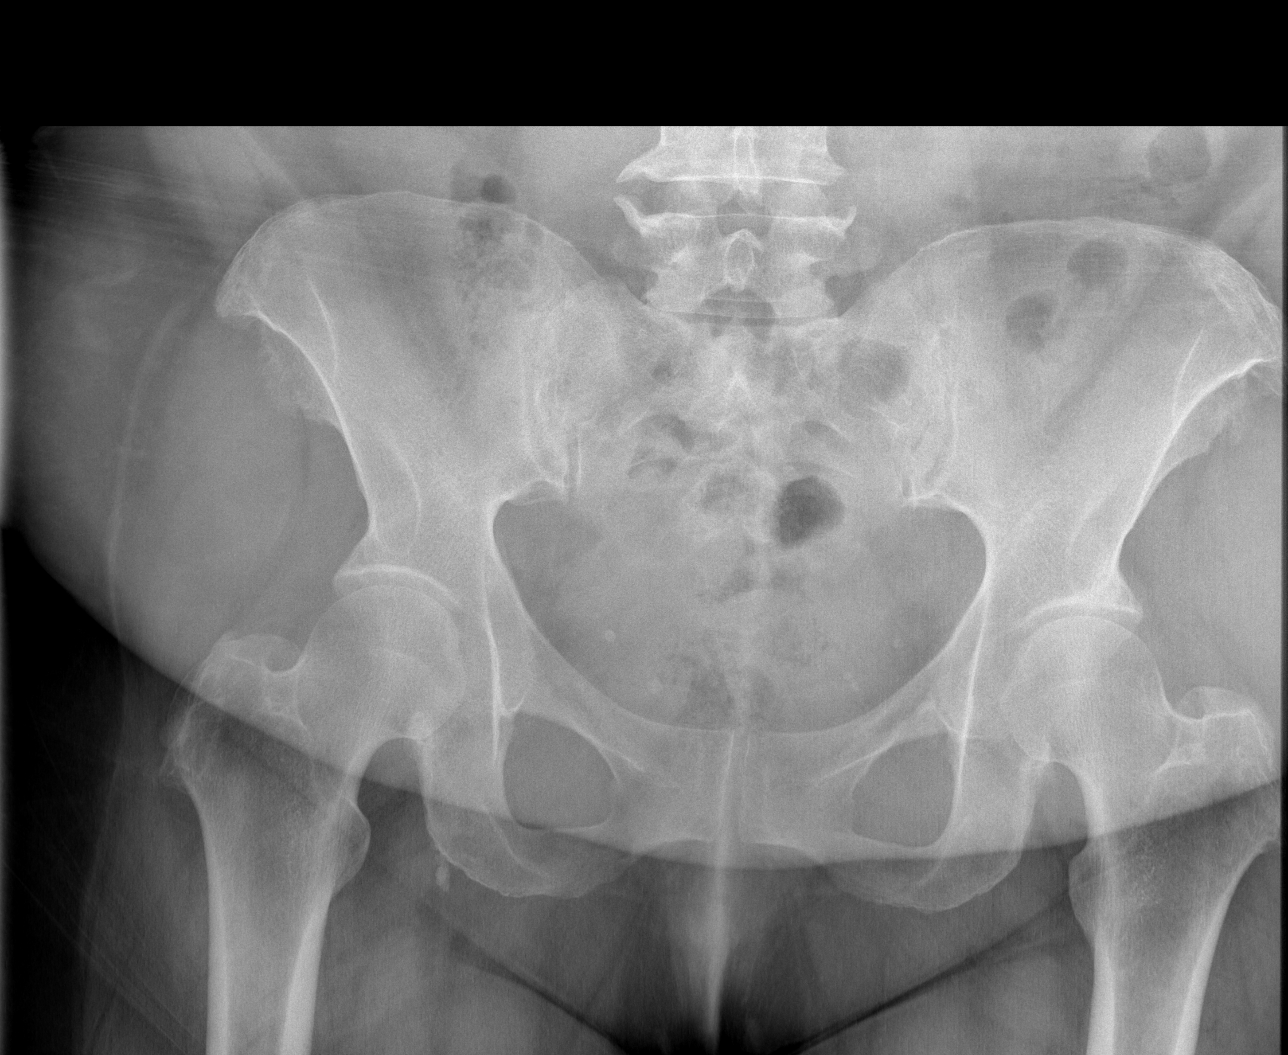

[t hip ap left]
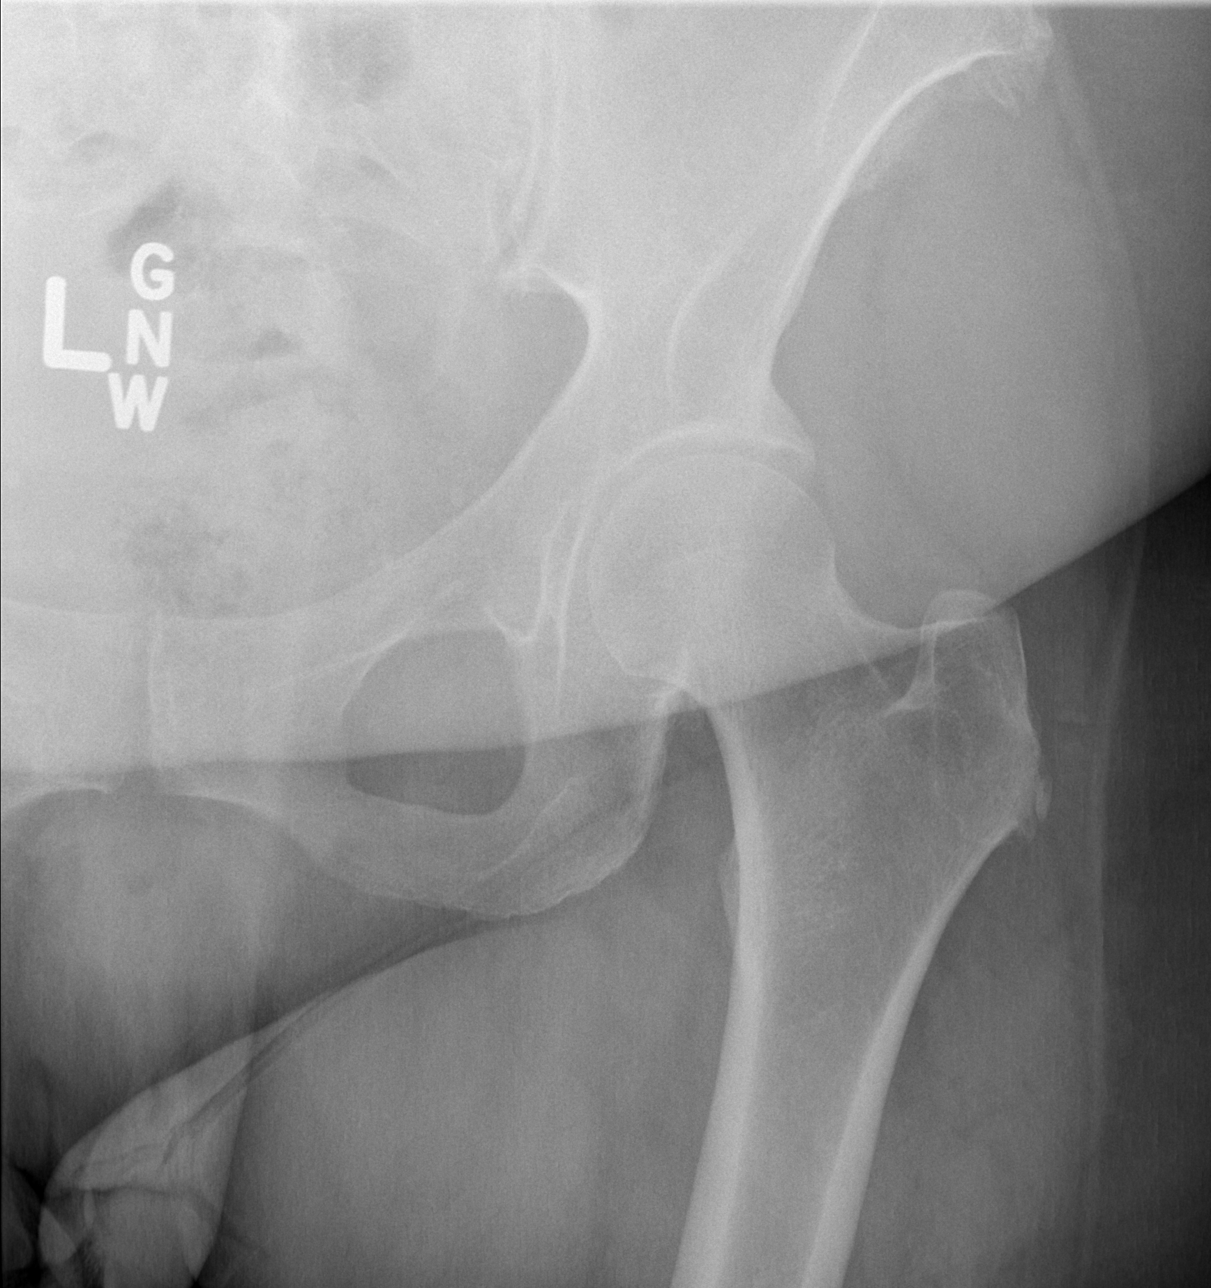

[t hip frog leg left]
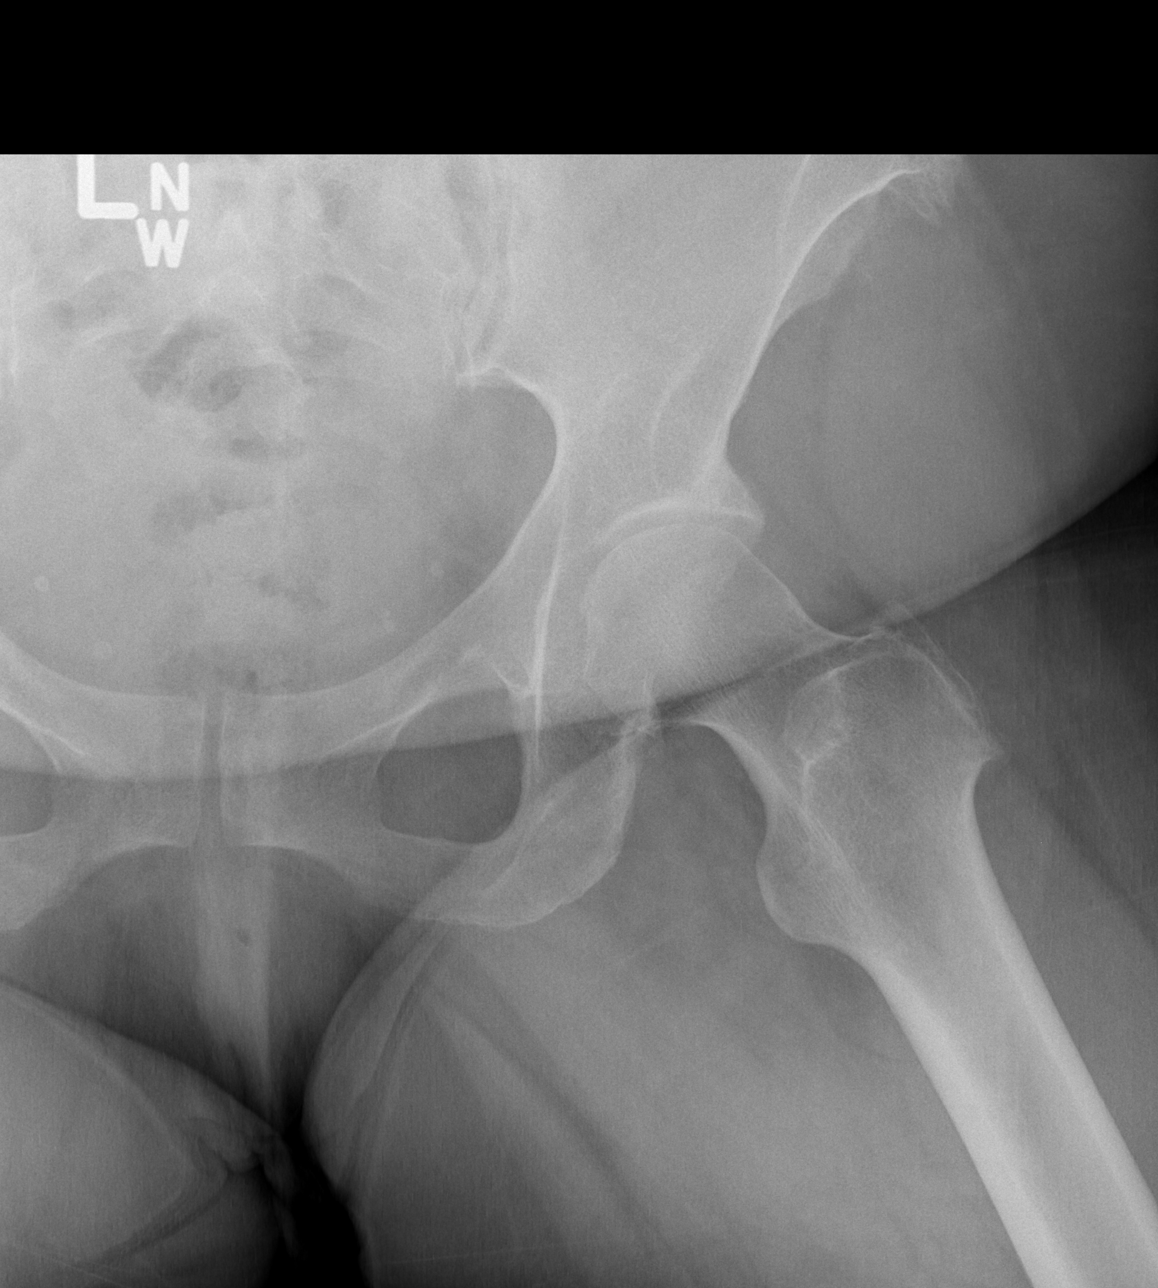

[3 of 3 positions shown; findings below may reference images not displayed]

FINDINGS: The hips are located.  There is no fracture.  The patient
has some enthesopathic change at the right hamstring origin.
IMPRESSION: No acute finding.

## 2013-03-06 IMAGING — MR MR LUMBAR SPINE W/O CM
4 of 11 series · 17 of 48 positions shown · non-contrast
Comparison: 06/05/2010 plain film exam and lumbar spine.

CLINICAL DATA: Post fall. Lobe back pain and sacral pain.

MRI LUMBAR SPINE WITHOUT CONTRAST,MR SACRUM WITHOUT CONTRAST
TECHNIQUE: Multiplanar and multiecho pulse sequences of the lumbar
spine were obtained without intravenous contrast.,

[Series 4: T2 · sagittal · 4.0mm · 0.55mm/px · 4 of 13 slices shown (1 of 2)]
[im 1/13]
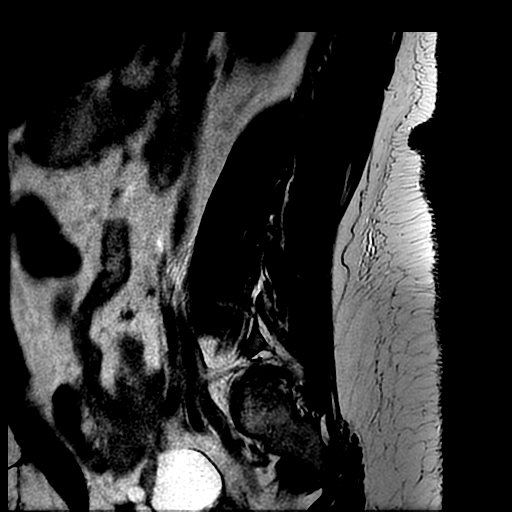
[im 5/13]
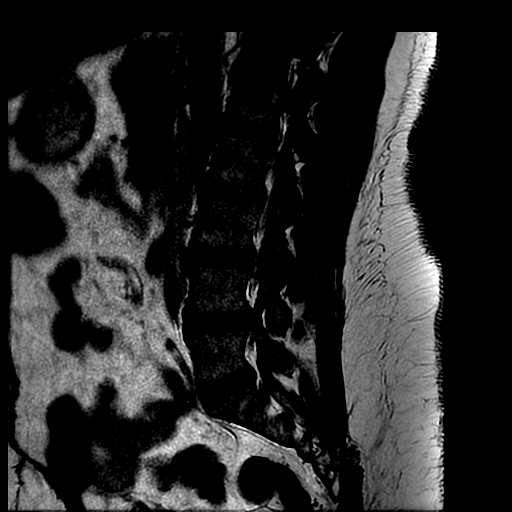
[im 9/13]
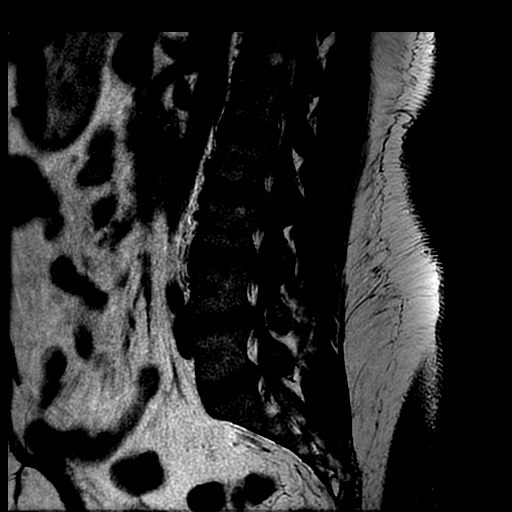
[im 13/13]
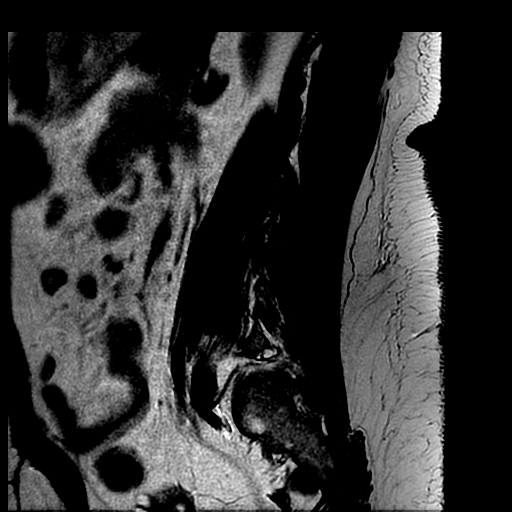

[Series 5: T1 · sagittal · 4.0mm · 0.55mm/px · 4 of 13 slices shown (1 of 2)]
[im 1/13]
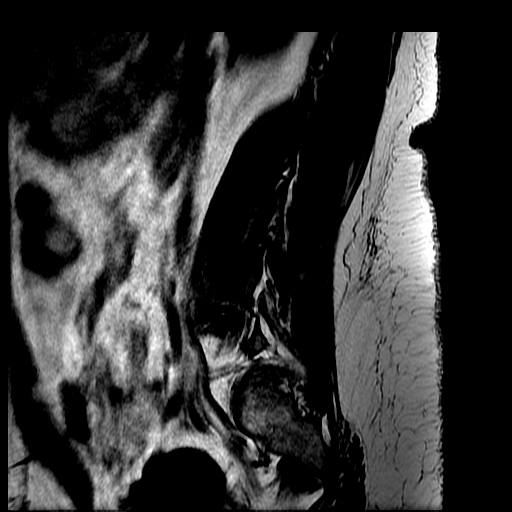
[im 5/13]
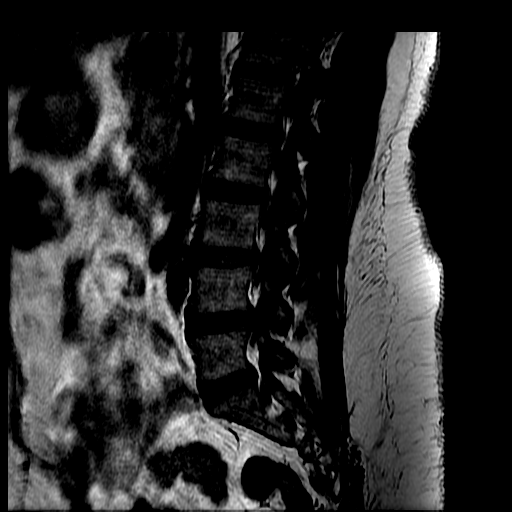
[im 9/13]
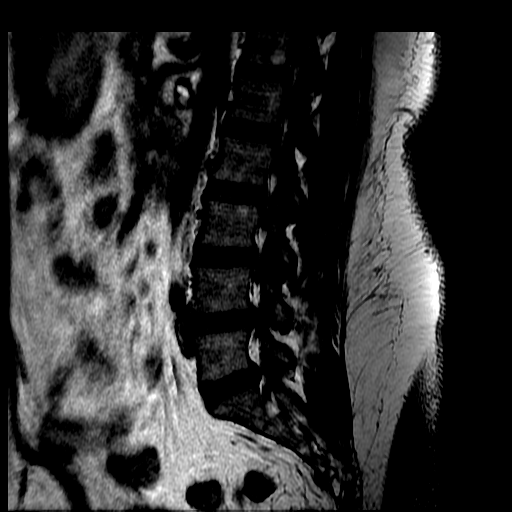
[im 13/13]
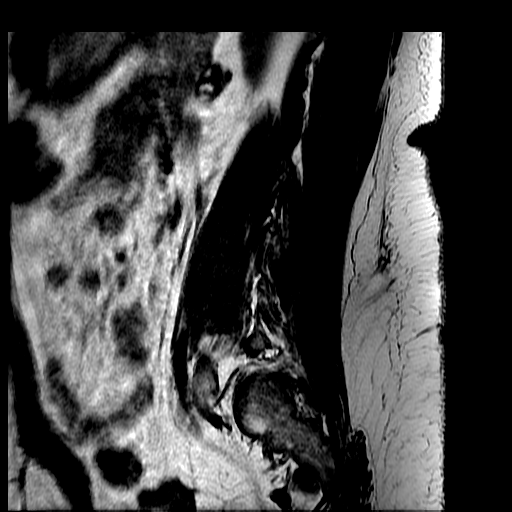

[Series 7: T2 · axial · 4.0mm · 0.39mm/px · z∈[-63,+97]mm · 5 of 21 slices shown (2 of 2)]
[im 1/21]
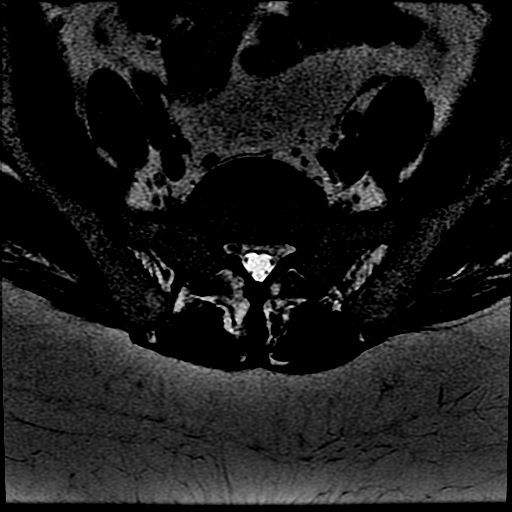
[im 6/21]
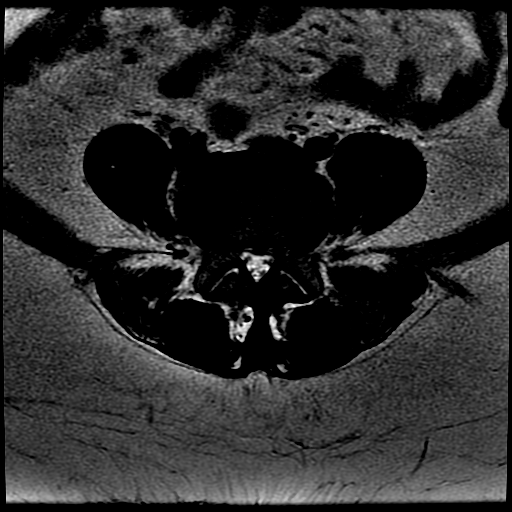
[im 11/21]
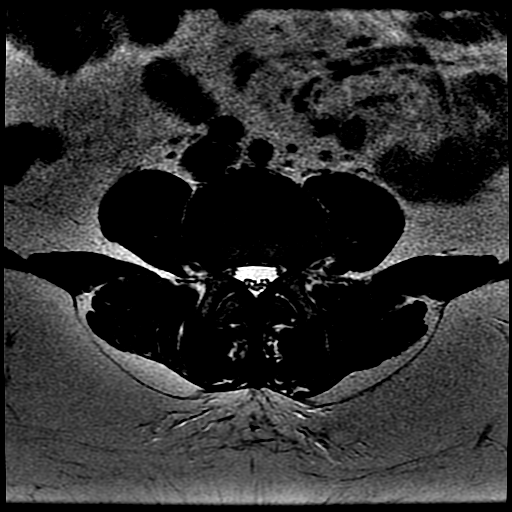
[im 16/21]
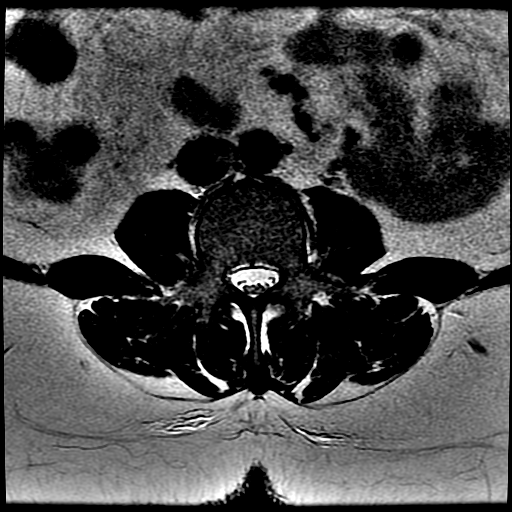
[im 21/21]
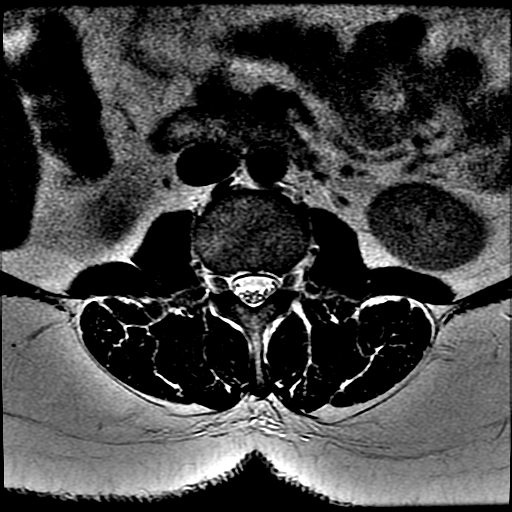

[Series 8: T1 · axial · 4.0mm · 0.39mm/px · z∈[-63,+97]mm · 4 of 21 slices shown (2 of 2)]
[im 1/21]
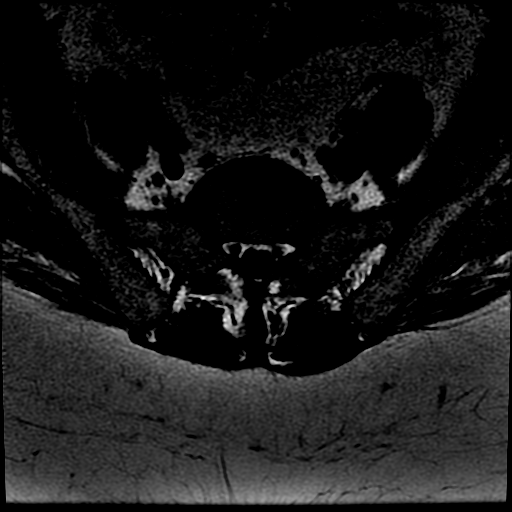
[im 6/21]
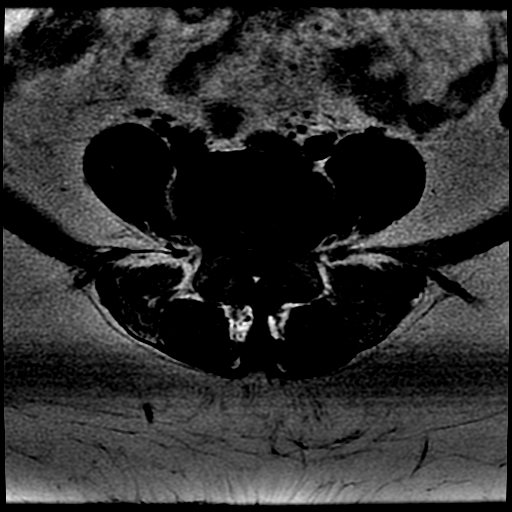
[im 11/21]
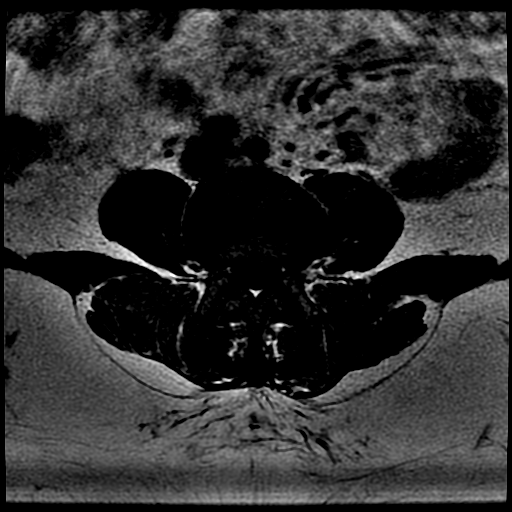
[im 21/21]
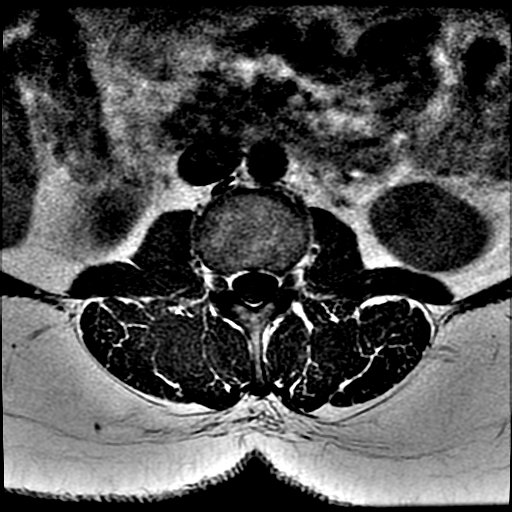

[17 of 48 positions shown; findings below may reference images not displayed]

Pelvic
sonogram 05/22/2008.  12/17/2005 CT of the abdomen pelvis without
contrast.
FINDINGS: Last fully open disc space is labeled L5-S1.  Present
examination incorporates from T12 through the lower sacrum.  Conus
L1 level.  No bony edema to suggest lumbar injury.

T12-L1 through L2-3 unremarkable.

L3-4:  Minimal bulge slightly greater left lateral position.  No
significant spinal stenosis or foraminal narrowing.

L4-5:  Moderate bulge with greater extension to the left.  This
combined with short pedicles and mild facet joint degenerative
changes is causing mild spinal stenosis/lateral recess stenosis
greater on the left and very mild bilateral foraminal narrowing.

L5-S1:  Mild bulge minimally more notable left paracentral
position.  No significant spinal stenosis or nerve root
compression.  Very mild facet joint degenerative changes.

Right ovary cysts largest measuring up to 4.2 cm.  Presacral fluid
appearing collection.  Please see below.
IMPRESSION: L4-5 moderate bulge with greater extension to the left.  This
combined with short pedicles and mild facet joint degenerative
changes is causing mild spinal stenosis/lateral recess stenosis
greater on the left and very mild bilateral foraminal narrowing.

L5-S1 mild bulge minimally more notable left paracentral position.

Right ovary cysts largest measuring up to 4.2 cm.  Presacral fluid
appearing collection.  Please see below.

MR SACRUM WITHOUT CONTRAST

Precontrast imaging with attention to state was performed without
contrast.
FINDINGS: Lower presacral fluid collection.  There is no adjacent bone edema
to suggest this represents a hematoma from underlying fracture.
The cause of this presacral fluid appearing collection is
indeterminate. There is a fat plane of the separation from this
presacral collection and the adjacent sigmoid colon and therefore
sigmoid diverticulitis type changes felt unlikely as cause this
appearance.  Postop changes related to hysterectomy not entirely
excluded.  If there are progressive symptoms referable to the
pelvis, this can be evaluated on follow-up MR.

Minimal edema within the left acetabular region probably related to
degenerative changes.

Slight increased amount of fluid within the right hip joint
compared to the left.  This may be related to underlying
degenerative changes.  The hips were not completely assessed on the
present exam.

Right ovarian cyst largest measures up to 4.2 cm.  Utilizing
ultrasound criteria, if the patient is premenopausal than this does
not require follow up as by MR this appears to be a simple cyst.
If the patient is perimenopausal or postmenopausal, the right
ovarian cystic structure can be evaluated on follow-up pelvic
sonogram in 1 year (portion of clinically indicated).
IMPRESSION: Nonspecific lower presacral fluid collection without underlying
bony injury as detailed above.

Right ovarian cyst largest measuring up to 4.2 cm with follow up as
discussed above.

## 2013-03-13 ENCOUNTER — Encounter: Payer: Self-pay | Admitting: Pulmonary Disease

## 2013-03-13 ENCOUNTER — Ambulatory Visit (INDEPENDENT_AMBULATORY_CARE_PROVIDER_SITE_OTHER): Payer: Medicaid Other | Admitting: Pulmonary Disease

## 2013-03-13 ENCOUNTER — Telehealth: Payer: Self-pay | Admitting: Pulmonary Disease

## 2013-03-13 VITALS — BP 118/76 | HR 84 | Temp 97.4°F | Ht 65.0 in | Wt 256.2 lb

## 2013-03-13 DIAGNOSIS — Z23 Encounter for immunization: Secondary | ICD-10-CM

## 2013-03-13 DIAGNOSIS — G4733 Obstructive sleep apnea (adult) (pediatric): Secondary | ICD-10-CM

## 2013-03-13 NOTE — Patient Instructions (Signed)
Will try a nasal mask or nasal pillows with a chin strap. Will have your machine put on the automatic setting so that you may tolerate better. Will refer you to the nutrition clinic at [facility]. Work on weight loss Will see you back in 8-12 weeks, and bring your machine with you to the visit.

## 2013-03-13 NOTE — Progress Notes (Signed)
   Subjective:    Patient ID: Michele Owens, female    DOB: 02-25-1961, 52 y.o.   MRN: [id number]  HPI Patient comes in today for followup of her obstructive sleep apnea. She never returned for a 6 week followup, and has discontinued CPAP. She has had issues with the full face mask, and has not been able to tolerate the device. She is asking about a different type of mask. She continues to have loud snoring and witnessed apneas, as well as nonrestorative sleep.   Review of Systems  Constitutional: Negative for fever and unexpected weight change.  HENT: Positive for dental problem. Negative for congestion, ear pain, nosebleeds, postnasal drip, rhinorrhea, sinus pressure, sneezing, sore throat and trouble swallowing.   Eyes: Negative for redness and itching.  Respiratory: Positive for shortness of breath and wheezing. Negative for cough and chest tightness.   Cardiovascular: Positive for palpitations and leg swelling.  Gastrointestinal: Negative for nausea and vomiting.  Genitourinary: Negative for dysuria.  Musculoskeletal: Positive for joint swelling.  Skin: Negative for rash.  Neurological: Positive for headaches.  Hematological: Bruises/bleeds easily.  Psychiatric/Behavioral: Positive for dysphoric mood. The patient is nervous/anxious.        Objective:   Physical Exam Obese female in no acute distress Nose without purulence or discharge noted No skin breakdown or pressure necrosis from the CPAP mask Neck without lymphadenopathy or thyromegaly Lower extremities with edema noted, no cyanosis Alert and oriented, moves all 4 extremities.       Assessment & Plan:

## 2013-03-13 NOTE — Telephone Encounter (Signed)
Patient was also non compliant and not using equipment, since patient turned in machine and was not compliant, patient would need another sleep study in order to qualify to obtain another CPAP.  Dr. Shelle Iron, Please advise. Rhonda J Cobb

## 2013-03-13 NOTE — Assessment & Plan Note (Signed)
The patient has moderate obstructive sleep apnea which currently is not being treated. She is willing to stay on CPAP if we can get a mask is comfortable for her. Will try a nasal mask or nasal pillows, and add a chin strap as well.  We'll also set her machine on the automatic setting for comfort, and she is interested in going to the nutrition clinic to work on weight loss. I will see her back in about 8 weeks, and we'll get a download off her machine.

## 2013-03-13 NOTE — Telephone Encounter (Signed)
Called [facility] and they stated that pt contacted them the end of March, stating that she couldn't wear the cpap and requested that [facility] pick up machine. Pt advised [facility] that she had too much going on to deal with this machine. Pt stated that she felt like she was smothering and was claustrophobic and just couldn't stand wearing the CPAP mask and using the machine. Pt also stated that she had too much going on with her and had a lot of issues with her grandchildren and requested that [facility] pick up the device. [facility] went out to patient's house tried to talk patient into trying another mask, pt didn't want to try, machine was picked up and patient signed an AMA form. Rhonda J Cobb

## 2013-03-14 ENCOUNTER — Other Ambulatory Visit: Payer: Self-pay | Admitting: Pulmonary Disease

## 2013-03-14 DIAGNOSIS — G4733 Obstructive sleep apnea (adult) (pediatric): Secondary | ICD-10-CM

## 2013-03-14 NOTE — Telephone Encounter (Signed)
Spoke to pt she is willing to start over with another study please put in order thanks Tobe Sos

## 2013-03-14 NOTE — Telephone Encounter (Signed)
Let pt know what the score is, and see if she is willing to do what is necessary to try cpap again.

## 2013-03-17 ENCOUNTER — Other Ambulatory Visit: Payer: Self-pay | Admitting: Pulmonary Disease

## 2013-03-17 DIAGNOSIS — G4733 Obstructive sleep apnea (adult) (pediatric): Secondary | ICD-10-CM

## 2013-03-17 NOTE — Telephone Encounter (Signed)
Order sent to [facility].  

## 2013-03-17 NOTE — Telephone Encounter (Signed)
Please advise Dr. Shelle Iron thanks

## 2013-03-24 ENCOUNTER — Encounter (INDEPENDENT_AMBULATORY_CARE_PROVIDER_SITE_OTHER): Payer: Medicaid Other

## 2013-03-24 ENCOUNTER — Encounter: Payer: Self-pay | Admitting: *Deleted

## 2013-03-24 DIAGNOSIS — R55 Syncope and collapse: Secondary | ICD-10-CM

## 2013-03-24 NOTE — Progress Notes (Signed)
Patient ID: Michele Owens, female   DOB: 06/13/1960, 52 y.o.   MRN: [id number] [employer] 30 day cardiac event monitor applied to patient.

## 2013-04-01 ENCOUNTER — Telehealth: Payer: Self-pay | Admitting: Cardiology

## 2013-04-01 NOTE — Telephone Encounter (Deleted)
ERROR

## 2013-04-07 ENCOUNTER — Encounter: Payer: Self-pay | Admitting: Cardiology

## 2013-04-07 ENCOUNTER — Ambulatory Visit (INDEPENDENT_AMBULATORY_CARE_PROVIDER_SITE_OTHER): Payer: Medicaid Other | Admitting: Cardiology

## 2013-04-07 VITALS — BP 130/70 | HR 88 | Ht 65.0 in | Wt 251.0 lb

## 2013-04-07 DIAGNOSIS — R079 Chest pain, unspecified: Secondary | ICD-10-CM

## 2013-04-07 DIAGNOSIS — R55 Syncope and collapse: Secondary | ICD-10-CM

## 2013-04-07 DIAGNOSIS — G4733 Obstructive sleep apnea (adult) (pediatric): Secondary | ICD-10-CM

## 2013-04-07 DIAGNOSIS — F17200 Nicotine dependence, unspecified, uncomplicated: Secondary | ICD-10-CM

## 2013-04-07 DIAGNOSIS — E669 Obesity, unspecified: Secondary | ICD-10-CM

## 2013-04-07 NOTE — Patient Instructions (Signed)
Your physician recommends that you continue on your current medications as directed. Please refer to the Current Medication list given to you today.  Call the office if you have any more episodes of your heart racing or fainting(syncope).[phone]  Ok to start Nicotine patch or gum, these can be purchased over the counter  Your physician wants you to follow-up in: 1 year You will receive a reminder letter in the mail two months in advance. If you don't receive a letter, please call our office to schedule the follow-up appointment.

## 2013-04-08 NOTE — Progress Notes (Signed)
Patient ID: Michele Owens, female   DOB: December 16, 1960, 52 y.o.   MRN: [id number] PCP: Dr. Mikeal Hawthorne  52 yo with history of HTN, obesity, OSA, hyperlipidemia, and SVT presents for cardiology evaluation.  She has a history of atypical chest pain.  ETT-Cardiolite in 12/13 was normal.  Echo in 4/13 showed normal EF.  I saw her recently because of increased episodes of tachypalpitations.  She also had 1 episode of syncope.  Interestingly, this was not associated with tachypalpitations.  No exertional chest pain.  She is chronically short of breath walking up steps.  Weight is down 4 lbs.  She still smokes about 1/2 ppd.    I had her wear an event monitor.  This showed no significant events.  However, on Saturday evening (she had already taken off the monitor), she developed an episode of heart racing.  This made her lightheaded but she did not pass out.    Labs (11/12): K 3.6, creatinine 0.73 Labs (12/13): K 3.9, creatinine 0.9, BNP 22 Labs (11/14): K 3.9, creatinine 0.5  PMH: 1. Back and hip pain 2. HTN 3. Obesity 4. GERD 5. Hyperlipidemia 6. Depression 7. COPD: Active smoker 8. 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 Event monitor (12/14) with only NSR noted.  10. Chest pain: Atypical.  ETT-Cardiolite (12/13) with EF 60%, no ischemia/infarction.  11. Syncope: Uncertain etiology.  Echo (4/13) with EF 55-60%, no significant valvular abnormalities.  12. OSA  SH: Smoker.  Lives alone.  Does not use drugs or ETOH.   FH: Father with cancer, mother with diabetes, brother with fen-phen related valvulopathy  ROS: All systems reviewed and negative except as per HPI.   Current Outpatient Prescriptions  Medication Sig Dispense Refill  . albuterol (PROVENTIL HFA;VENTOLIN HFA) 108 (90 BASE) MCG/ACT inhaler Inhale 1-2 puffs into the lungs every 6 (six) hours as needed for wheezing.  1 Inhaler  0  . metoprolol succinate (TOPROL-XL) 50 MG 24 hr tablet Take 1 tablet  (50 mg total) by mouth daily.  30 tablet  11  . omeprazole (PRILOSEC) 40 MG capsule Take 40 mg by mouth daily.      Marland Kitchen PARoxetine (PAXIL) 20 MG tablet Take 20 mg by mouth daily.      . pravastatin (PRAVACHOL) 20 MG tablet Take 1 tablet (20 mg total) by mouth daily.  30 tablet  2   No current facility-administered medications for this visit.    BP 130/70  Pulse 88  Ht 5\' 5"  (1.651 m)  Wt 113.853 kg (251 lb)  BMI 41.77 kg/m2 General: NAD, obese.  Neck: No JVD, no thyromegaly or thyroid nodule.  Lungs: Clear to auscultation bilaterally with normal respiratory effort. CV: Nondisplaced PMI.  Heart regular S1/S2, no S3/S4, 1/6 SEM RUSB.  No peripheral edema.  No carotid bruit.  Normal pedal pulses.  Abdomen: Soft, nontender, no hepatosplenomegaly, no distention.  Skin: Intact without lesions or rashes.  Neurologic: Alert and oriented x 3.  Psych: Normal affect.  Extremities: No clubbing or cyanosis.   Assessment/Plan: 1. Chest pain: Atypical, suspect noncardiac.  She had a normal Cardiolite in 12/13.  2. Obesity: Needs weight loss.  Will need aggressive diet/exercise. Weight down 4 lbs since last  3. Syncope/palpitations: Not orthostatic.  She did not have tachypalpitations prior to her recent syncope, just lightheadedness. However, she has frequent spells where she feels rapid heart rate.  She has a history of SVT.  She wore an event monitor but did  not have significant symptoms or any arrhythmia noted while the monitor was on. She had another episode of tachypalpitations after she took off the monitor.  - Continue to follow for now, continue Toprol XL.  - If she passes out again or has frequent bothersome tachypalpitations, could consider implanted loop recorder.  4. OSA: She will have a sleep study for CPAP titration.    5. Smoking: I strongly encouraged her to quit smoking.   Marca Ancona 04/08/2013 12:27 AM

## 2013-04-11 ENCOUNTER — Encounter (HOSPITAL_COMMUNITY): Payer: Self-pay | Admitting: Emergency Medicine

## 2013-04-11 ENCOUNTER — Emergency Department (HOSPITAL_COMMUNITY)
Admission: EM | Admit: 2013-04-11 | Discharge: 2013-04-11 | Disposition: A | Discharge status: Home / Self Care | Payer: No Typology Code available for payment source | Attending: Emergency Medicine | Admitting: Emergency Medicine

## 2013-04-11 DIAGNOSIS — Y9289 Other specified places as the place of occurrence of the external cause: Secondary | ICD-10-CM | POA: Diagnosis not present

## 2013-04-11 DIAGNOSIS — Z8742 Personal history of other diseases of the female genital tract: Secondary | ICD-10-CM | POA: Diagnosis not present

## 2013-04-11 DIAGNOSIS — J449 Chronic obstructive pulmonary disease, unspecified: Secondary | ICD-10-CM | POA: Insufficient documentation

## 2013-04-11 DIAGNOSIS — I10 Essential (primary) hypertension: Secondary | ICD-10-CM | POA: Insufficient documentation

## 2013-04-11 DIAGNOSIS — Y9389 Activity, other specified: Secondary | ICD-10-CM | POA: Insufficient documentation

## 2013-04-11 DIAGNOSIS — S0003XA Contusion of scalp, initial encounter: Secondary | ICD-10-CM | POA: Diagnosis not present

## 2013-04-11 DIAGNOSIS — F17200 Nicotine dependence, unspecified, uncomplicated: Secondary | ICD-10-CM | POA: Insufficient documentation

## 2013-04-11 DIAGNOSIS — K219 Gastro-esophageal reflux disease without esophagitis: Secondary | ICD-10-CM | POA: Insufficient documentation

## 2013-04-11 DIAGNOSIS — G4733 Obstructive sleep apnea (adult) (pediatric): Secondary | ICD-10-CM | POA: Insufficient documentation

## 2013-04-11 DIAGNOSIS — E785 Hyperlipidemia, unspecified: Secondary | ICD-10-CM | POA: Insufficient documentation

## 2013-04-11 DIAGNOSIS — S1093XA Contusion of unspecified part of neck, initial encounter: Secondary | ICD-10-CM

## 2013-04-11 DIAGNOSIS — Z79899 Other long term (current) drug therapy: Secondary | ICD-10-CM | POA: Diagnosis not present

## 2013-04-11 DIAGNOSIS — IMO0002 Reserved for concepts with insufficient information to code with codable children: Secondary | ICD-10-CM | POA: Diagnosis present

## 2013-04-11 DIAGNOSIS — T148XXA Other injury of unspecified body region, initial encounter: Secondary | ICD-10-CM

## 2013-04-11 DIAGNOSIS — E669 Obesity, unspecified: Secondary | ICD-10-CM | POA: Insufficient documentation

## 2013-04-11 DIAGNOSIS — F3289 Other specified depressive episodes: Secondary | ICD-10-CM | POA: Insufficient documentation

## 2013-04-11 DIAGNOSIS — S161XXA Strain of muscle, fascia and tendon at neck level, initial encounter: Secondary | ICD-10-CM

## 2013-04-11 DIAGNOSIS — F329 Major depressive disorder, single episode, unspecified: Secondary | ICD-10-CM | POA: Insufficient documentation

## 2013-04-11 DIAGNOSIS — J4489 Other specified chronic obstructive pulmonary disease: Secondary | ICD-10-CM | POA: Insufficient documentation

## 2013-04-11 DIAGNOSIS — S0083XA Contusion of other part of head, initial encounter: Secondary | ICD-10-CM

## 2013-04-11 DIAGNOSIS — S139XXA Sprain of joints and ligaments of unspecified parts of neck, initial encounter: Secondary | ICD-10-CM | POA: Insufficient documentation

## 2013-04-11 MED ORDER — HYDROCODONE-ACETAMINOPHEN 5-325 MG PO TABS
1.0000 | ORAL_TABLET | Freq: Once | ORAL | Status: AC
  Administered 2013-04-11: 1 via ORAL
  Filled 2013-04-11: qty 1

## 2013-04-11 MED ORDER — CYCLOBENZAPRINE HCL 10 MG PO TABS: 10.0000 mg | ORAL_TABLET | Freq: Two times a day (BID) | ORAL | Status: DC | PRN

## 2013-04-11 MED ORDER — TRAMADOL HCL 50 MG PO TABS: 50.0000 mg | ORAL_TABLET | Freq: Four times a day (QID) | ORAL | Status: DC | PRN

## 2013-04-11 MED ORDER — CYCLOBENZAPRINE HCL 10 MG PO TABS
5.0000 mg | ORAL_TABLET | Freq: Once | ORAL | Status: AC
  Administered 2013-04-11: 5 mg via ORAL
  Filled 2013-04-11: qty 1
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
## 2013-04-11 NOTE — Discharge Instructions (Signed)
Cervical Sprain A cervical sprain is an injury in the neck in which the ligaments are stretched or torn. The ligaments are the tissues that hold the bones of the neck (vertebrae) in place.Cervical sprains can range from very mild to very severe. Most cervical sprains get better in 1 to 3 weeks, but it depends on the cause and extent of the injury. Severe cervical sprains can cause the neck vertebrae to be unstable. This can lead to damage of the spinal cord and can result in serious nervous system problems. Your caregiver will determine whether your cervical sprain is mild or severe. CAUSES  Severe cervical sprains may be caused by:  Contact sport injuries (football, rugby, wrestling, hockey, auto racing, gymnastics, diving, martial arts, boxing).  Motor vehicle collisions.  Whiplash injuries. This means the neck is forcefully whipped backward and forward.  Falls. Mild cervical sprains may be caused by:   Awkward positions, such as cradling a telephone between your ear and shoulder.  Sitting in a chair that does not offer proper support.  Working at a poorly Landscape architect station.  Activities that require looking up or down for long periods of time. SYMPTOMS   Pain, soreness, stiffness, or a burning sensation in the front, back, or sides of the neck. This discomfort may develop immediately after injury or it may develop slowly and not begin for 24 hours or more after an injury.  Pain or tenderness directly in the middle of the back of the neck.  Shoulder or upper back pain.  Limited ability to move the neck.  Headache.  Dizziness.  Weakness, numbness, or tingling in the hands or arms.  Muscle spasms.  Difficulty swallowing or chewing.  Tenderness and swelling of the neck. DIAGNOSIS  Most of the time, your caregiver can diagnose this problem by taking your history and doing a physical exam. Your caregiver will ask about any known problems, such as arthritis in the neck  or a previous neck injury. X-rays may be taken to find out if there are any other problems, such as problems with the bones of the neck. However, an X-ray often does not reveal the full extent of a cervical sprain. Other tests such as a computed tomography (CT) scan or magnetic resonance imaging (MRI) may be needed. TREATMENT  Treatment depends on the severity of the cervical sprain. Mild sprains can be treated with rest, keeping the neck in place (immobilization), and pain medicines. Severe cervical sprains need immediate immobilization and an appointment with an orthopedist or neurosurgeon. Several treatment options are available to help with pain, muscle spasms, and other symptoms. Your caregiver may prescribe:  Medicines, such as pain relievers, numbing medicines, or muscle relaxants.  Physical therapy. This can include stretching exercises, strengthening exercises, and posture training. Exercises and improved posture can help stabilize the neck, strengthen muscles, and help stop symptoms from returning.  A neck collar to be worn for short periods of time. Often, these collars are worn for comfort. However, certain collars may be worn to protect the neck and prevent further worsening of a serious cervical sprain. HOME CARE INSTRUCTIONS   Put ice on the injured area.  Put ice in a plastic bag.  Place a towel between your skin and the bag.  Leave the ice on for 15-20 minutes, 03-04 times a day.  Only take over-the-counter or prescription medicines for pain, discomfort, or fever as directed by your caregiver.  Keep all follow-up appointments as directed by your caregiver.  Keep all  physical therapy appointments as directed by your caregiver.  If a neck collar is prescribed, wear it as directed by your caregiver.  Do not drive while wearing a neck collar.  Make any needed adjustments to your work station to promote good posture.  Avoid positions and activities that make your symptoms  worse.  Warm up and stretch before being active to help prevent problems. SEEK MEDICAL CARE IF:   Your pain is not controlled with medicine.  You are unable to decrease your pain medicine over time as planned.  Your activity level is not improving as expected. SEEK IMMEDIATE MEDICAL CARE IF:   You develop any bleeding, stomach upset, or signs of an allergic reaction to your medicine.  Your symptoms get worse.  You develop new, unexplained symptoms.  You have numbness, tingling, weakness, or paralysis in any part of your body. MAKE SURE YOU:   Understand these instructions.  Will watch your condition.  Will get help right away if you are not doing well or get worse. Document Released: 01/22/2007 Document Revised: 06/19/2011 Document Reviewed: 10/02/2012 [facility] Patient Information 2014 [place].  Contusion A contusion is a deep bruise. Contusions are the result of an injury that caused bleeding under the skin. The contusion may turn blue, purple, or yellow. Minor injuries will give you a painless contusion, but more severe contusions may stay painful and swollen for a few weeks.  CAUSES  A contusion is usually caused by a blow, trauma, or direct force to an area of the body. SYMPTOMS   Swelling and redness of the injured area.  Bruising of the injured area.  Tenderness and soreness of the injured area.  Pain. DIAGNOSIS  The diagnosis can be made by taking a history and physical exam. An X-ray, CT scan, or MRI may be needed to determine if there were any associated injuries, such as fractures. TREATMENT  Specific treatment will depend on what area of the body was injured. In general, the best treatment for a contusion is resting, icing, elevating, and applying cold compresses to the injured area. Over-the-counter medicines may also be recommended for pain control. Ask your caregiver what the best treatment is for your contusion. HOME CARE INSTRUCTIONS   Put ice  on the injured area.  Put ice in a plastic bag.  Place a towel between your skin and the bag.  Leave the ice on for 15-20 minutes, 03-04 times a day.  Only take over-the-counter or prescription medicines for pain, discomfort, or fever as directed by your caregiver. Your caregiver may recommend avoiding anti-inflammatory medicines (aspirin, ibuprofen, and naproxen) for 48 hours because these medicines may increase bruising.  Rest the injured area.  If possible, elevate the injured area to reduce swelling. SEEK IMMEDIATE MEDICAL CARE IF:   You have increased bruising or swelling.  You have pain that is getting worse.  Your swelling or pain is not relieved with medicines. MAKE SURE YOU:   Understand these instructions.  Will watch your condition.  Will get help right away if you are not doing well or get worse. Document Released: 01/04/2005 Document Revised: 06/19/2011 Document Reviewed: 01/30/2011 [facility] Patient Information 2014 [place], [place].

## 2013-04-11 NOTE — ED Notes (Signed)
Patient given a bus pass. 

## 2013-04-14 NOTE — Telephone Encounter (Signed)
Patient call and informed monitor mailed back to [facility] 04/07/2013.

## 2013-04-14 NOTE — Telephone Encounter (Signed)
New Prob    Pt would like to know if her life watch machine was returned. Please call.

## 2013-04-14 NOTE — Telephone Encounter (Signed)
Will forward to [place] in the monitor room.

## 2013-04-21 ENCOUNTER — Encounter (HOSPITAL_BASED_OUTPATIENT_CLINIC_OR_DEPARTMENT_OTHER): Payer: Medicaid Other

## 2013-04-21 ENCOUNTER — Ambulatory Visit
Admission: RE | Admit: 2013-04-21 | Discharge: 2013-04-21 | Disposition: A | Discharge status: Home / Self Care | Payer: Medicaid Other | Source: Ambulatory Visit | Attending: Internal Medicine | Admitting: Internal Medicine

## 2013-04-21 ENCOUNTER — Other Ambulatory Visit: Payer: Self-pay | Admitting: Internal Medicine

## 2013-04-21 DIAGNOSIS — M545 Low back pain, unspecified: Secondary | ICD-10-CM

## 2013-04-21 DIAGNOSIS — M25511 Pain in right shoulder: Secondary | ICD-10-CM

## 2013-04-29 ENCOUNTER — Ambulatory Visit: Payer: Medicaid Other

## 2013-04-30 ENCOUNTER — Ambulatory Visit: Payer: No Typology Code available for payment source | Attending: Internal Medicine

## 2013-04-30 DIAGNOSIS — M539 Dorsopathy, unspecified: Secondary | ICD-10-CM | POA: Insufficient documentation

## 2013-04-30 DIAGNOSIS — IMO0001 Reserved for inherently not codable concepts without codable children: Secondary | ICD-10-CM | POA: Diagnosis present

## 2013-04-30 DIAGNOSIS — M545 Low back pain, unspecified: Secondary | ICD-10-CM | POA: Diagnosis not present

## 2013-04-30 DIAGNOSIS — R293 Abnormal posture: Secondary | ICD-10-CM | POA: Diagnosis not present

## 2013-04-30 DIAGNOSIS — R5381 Other malaise: Secondary | ICD-10-CM | POA: Diagnosis not present

## 2013-05-05 ENCOUNTER — Ambulatory Visit: Payer: No Typology Code available for payment source

## 2013-05-05 DIAGNOSIS — IMO0001 Reserved for inherently not codable concepts without codable children: Secondary | ICD-10-CM | POA: Diagnosis not present

## 2013-05-06 ENCOUNTER — Ambulatory Visit: Payer: No Typology Code available for payment source

## 2013-05-06 DIAGNOSIS — IMO0001 Reserved for inherently not codable concepts without codable children: Secondary | ICD-10-CM | POA: Diagnosis not present

## 2013-05-08 ENCOUNTER — Ambulatory Visit: Payer: Medicaid Other | Admitting: Pulmonary Disease

## 2013-05-09 ENCOUNTER — Ambulatory Visit: Payer: No Typology Code available for payment source

## 2013-05-09 DIAGNOSIS — IMO0001 Reserved for inherently not codable concepts without codable children: Secondary | ICD-10-CM | POA: Diagnosis not present

## 2013-05-10 IMAGING — US US TRANSVAGINAL NON-OB
1 series · 14 of 25 positions shown · non-contrast
Comparison: None.

CLINICAL DATA: Right lower quadrant abdominal pain.



[Series 1: us transvaginal non-ob · 14 of 26 slices shown]
[im 1/26]
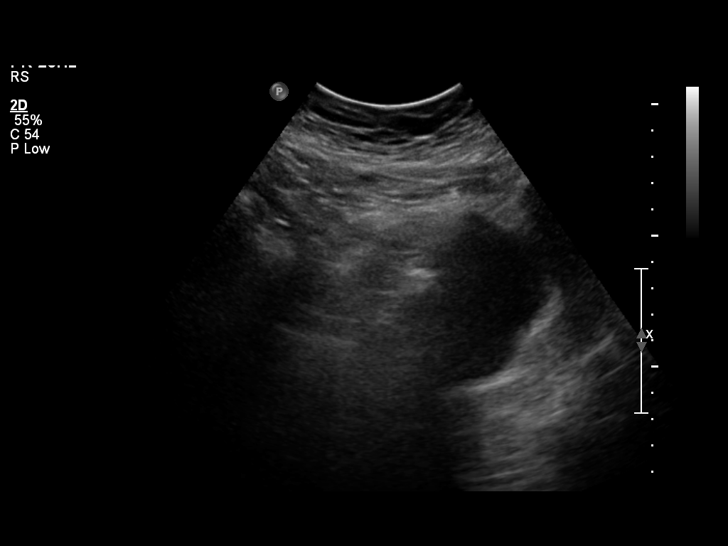
[im 3/26]
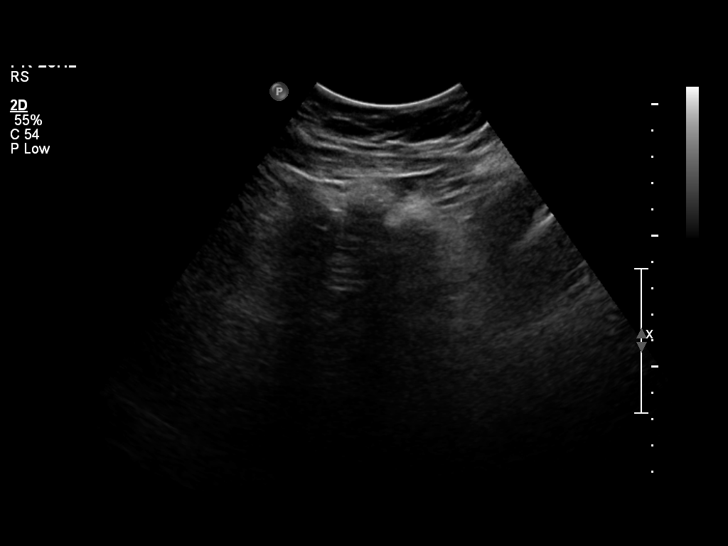
[im 5/26]
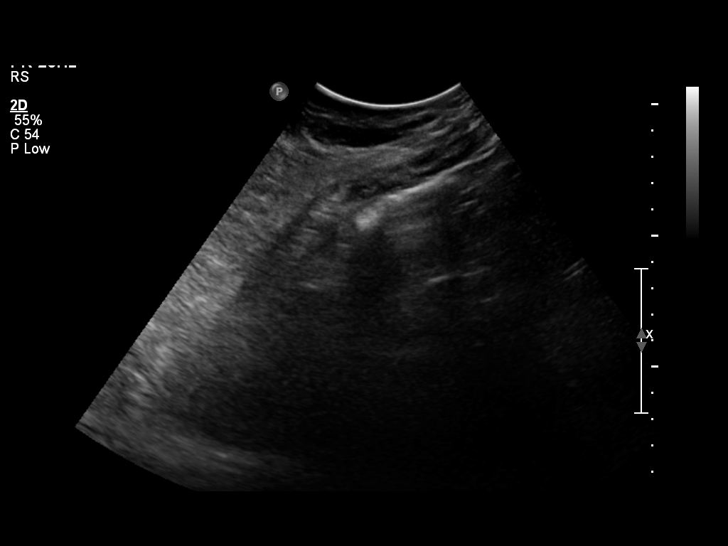
[im 7/26]
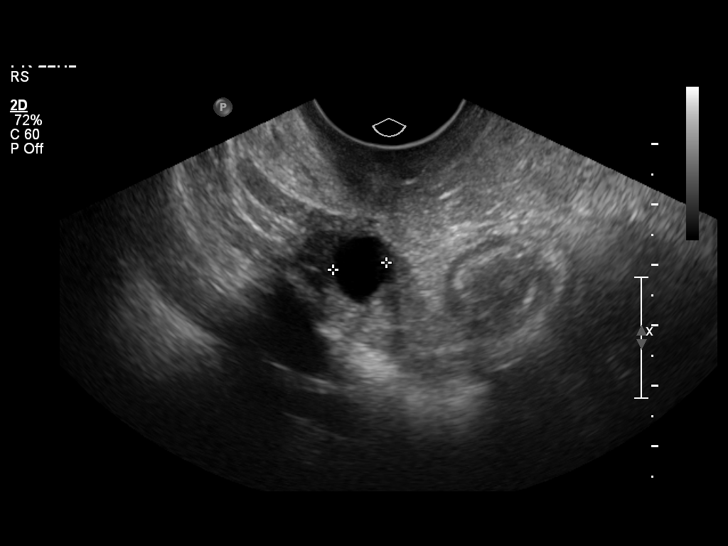
[im 9/26]
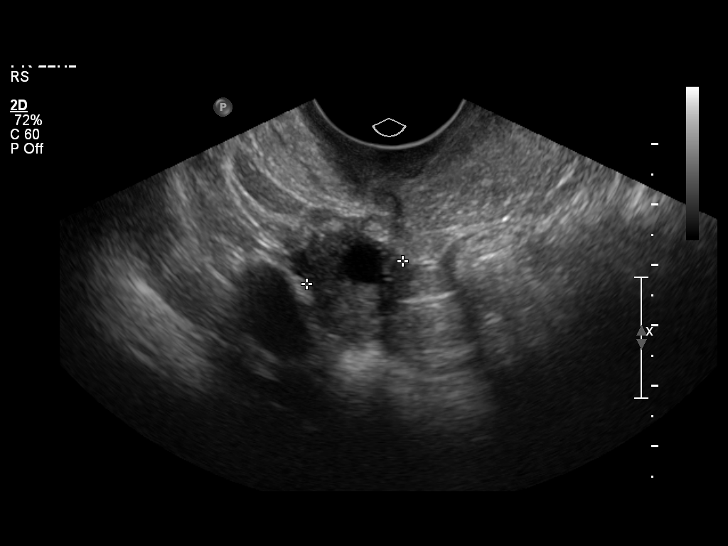
[im 10/26]
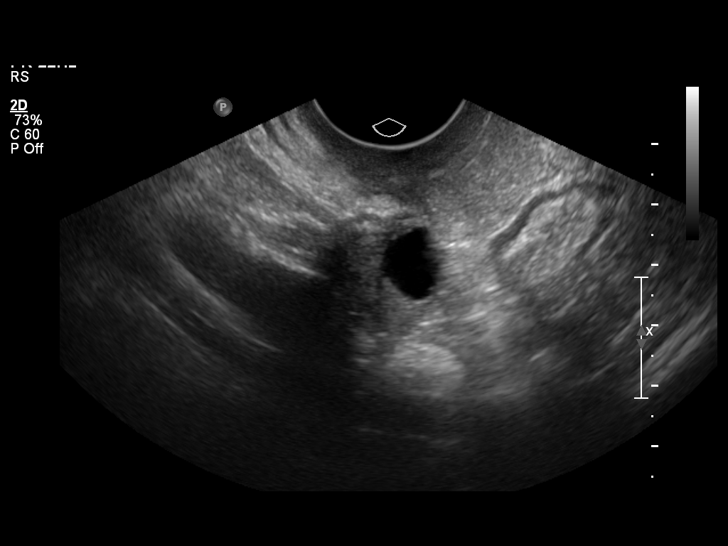
[im 12/26]
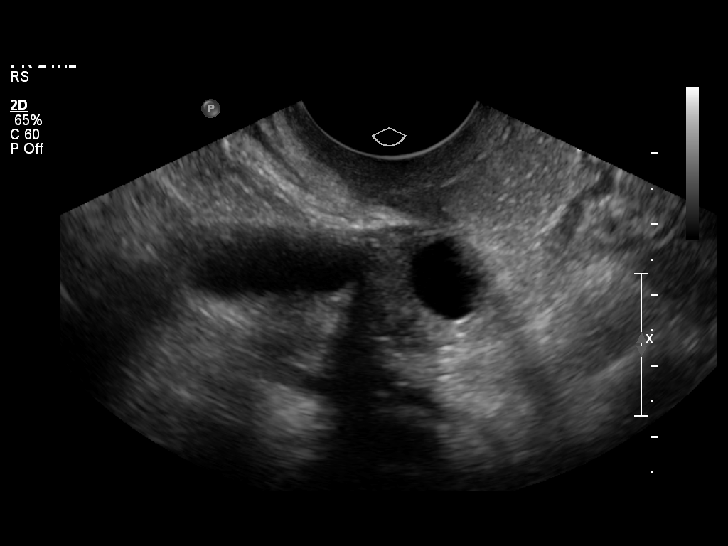
[im 14/26]
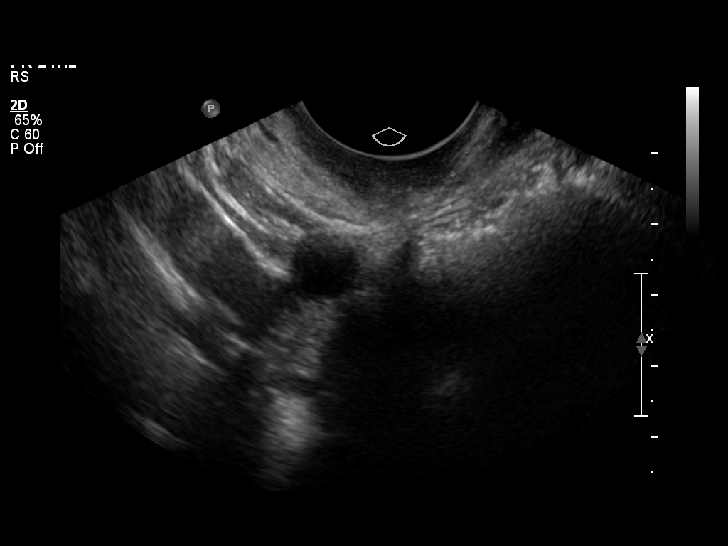
[im 16/26]
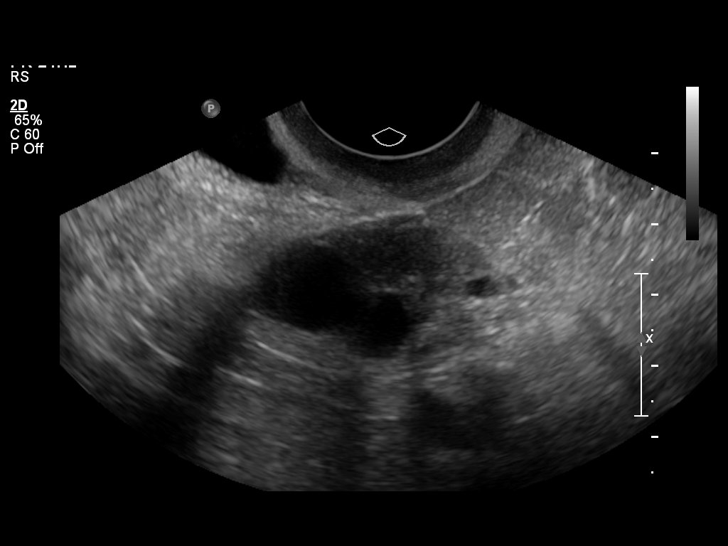
[im 17/26]
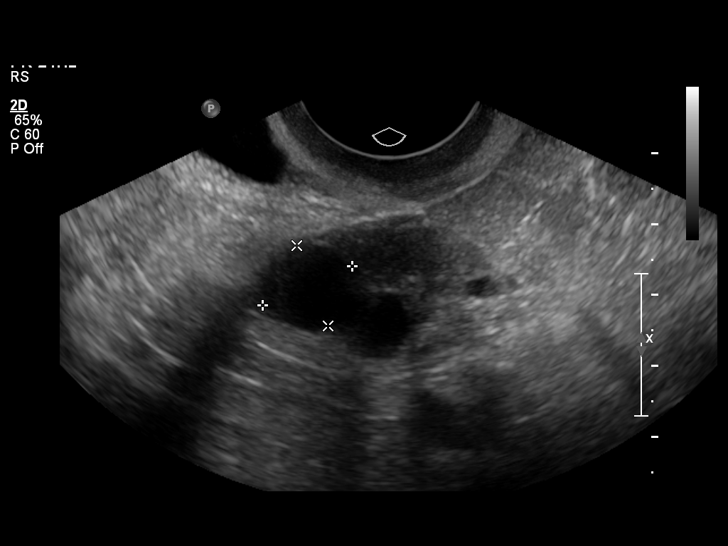
[im 19/26]
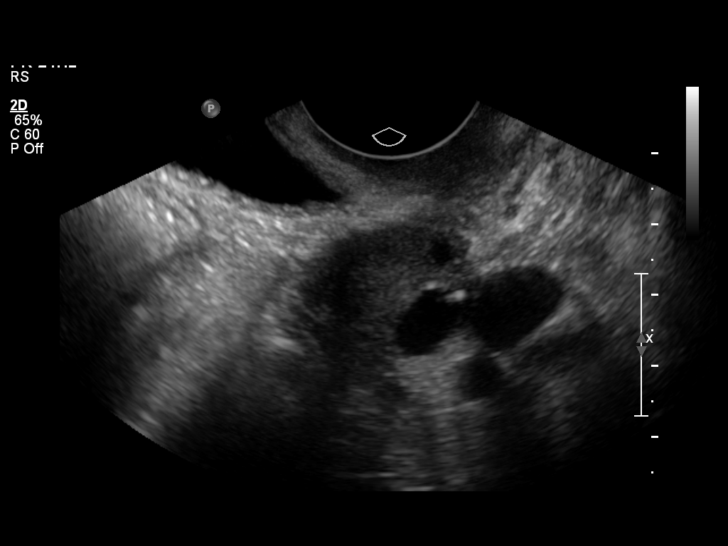
[im 21/26]
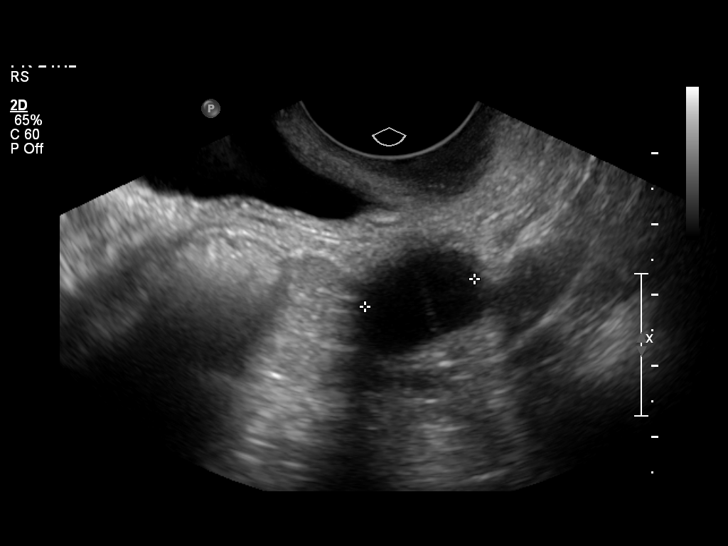
[im 23/26]
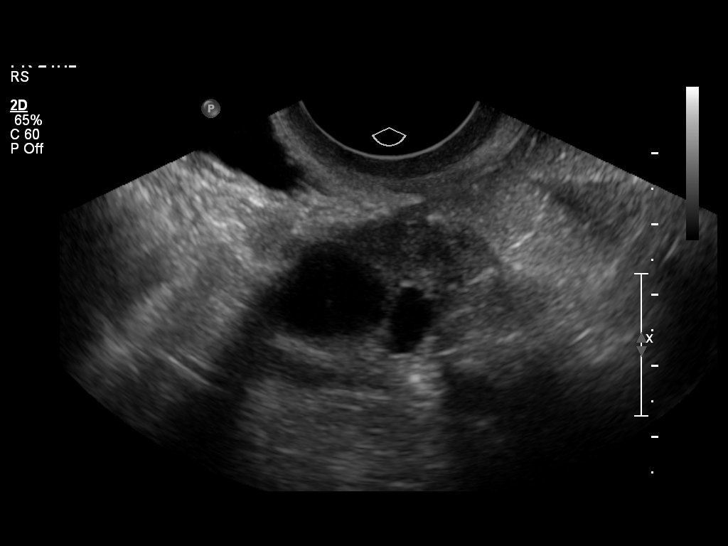
[im 26/26]
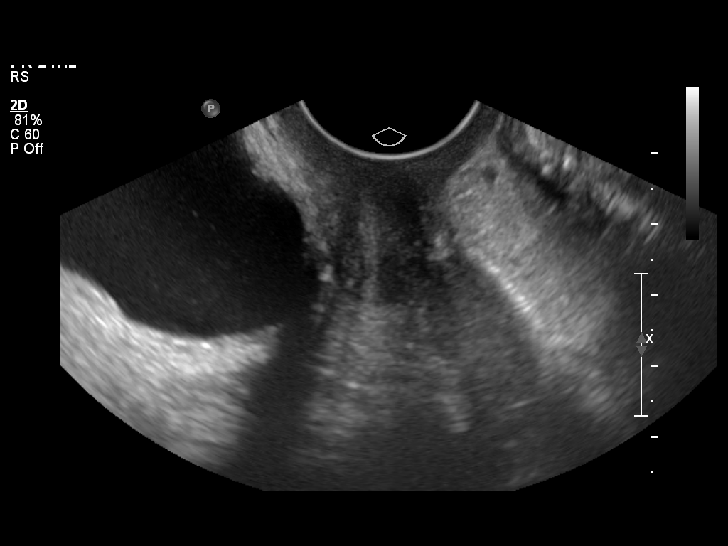

[14 of 25 positions shown; findings below may reference images not displayed]

FINDINGS: Uterus:  Surgically absent.

Endometrium:  N/A

Right ovary:  Measures 4.6 x 1.7 x 1.6 cm.  Normal appearance/no
adnexal mass.

Left ovary:  Measures 3.0 x 1.9 x 2.0 cm.  Normal appearance/no
adnexal mass.

Other findings:  No free fluid.
IMPRESSION: Normal study, status post hysterectomy.  No evidence of pelvic mass
or other significant abnormality.

## 2013-05-12 ENCOUNTER — Encounter: Payer: Medicaid Other | Admitting: Rehabilitation

## 2013-05-12 ENCOUNTER — Ambulatory Visit: Payer: No Typology Code available for payment source | Attending: Internal Medicine | Admitting: Rehabilitation

## 2013-05-12 DIAGNOSIS — M539 Dorsopathy, unspecified: Secondary | ICD-10-CM | POA: Insufficient documentation

## 2013-05-12 DIAGNOSIS — M545 Low back pain, unspecified: Secondary | ICD-10-CM | POA: Insufficient documentation

## 2013-05-12 DIAGNOSIS — R293 Abnormal posture: Secondary | ICD-10-CM | POA: Diagnosis not present

## 2013-05-12 DIAGNOSIS — IMO0001 Reserved for inherently not codable concepts without codable children: Secondary | ICD-10-CM | POA: Diagnosis present

## 2013-05-12 DIAGNOSIS — R5381 Other malaise: Secondary | ICD-10-CM | POA: Diagnosis not present

## 2013-05-14 ENCOUNTER — Ambulatory Visit: Payer: No Typology Code available for payment source

## 2013-05-14 DIAGNOSIS — IMO0001 Reserved for inherently not codable concepts without codable children: Secondary | ICD-10-CM | POA: Diagnosis not present

## 2013-05-16 ENCOUNTER — Ambulatory Visit: Payer: No Typology Code available for payment source

## 2013-05-16 DIAGNOSIS — IMO0001 Reserved for inherently not codable concepts without codable children: Secondary | ICD-10-CM | POA: Diagnosis not present

## 2013-05-20 IMAGING — CR DG CHEST 2V
2 series · 2 of 2 positions shown · non-contrast
Comparison: 09/26/2007

CLINICAL DATA: Left upper chest pain, shortness of breath, asthma,
bronchitis, smoker.

CHEST - 2 VIEW

[w chest pa]
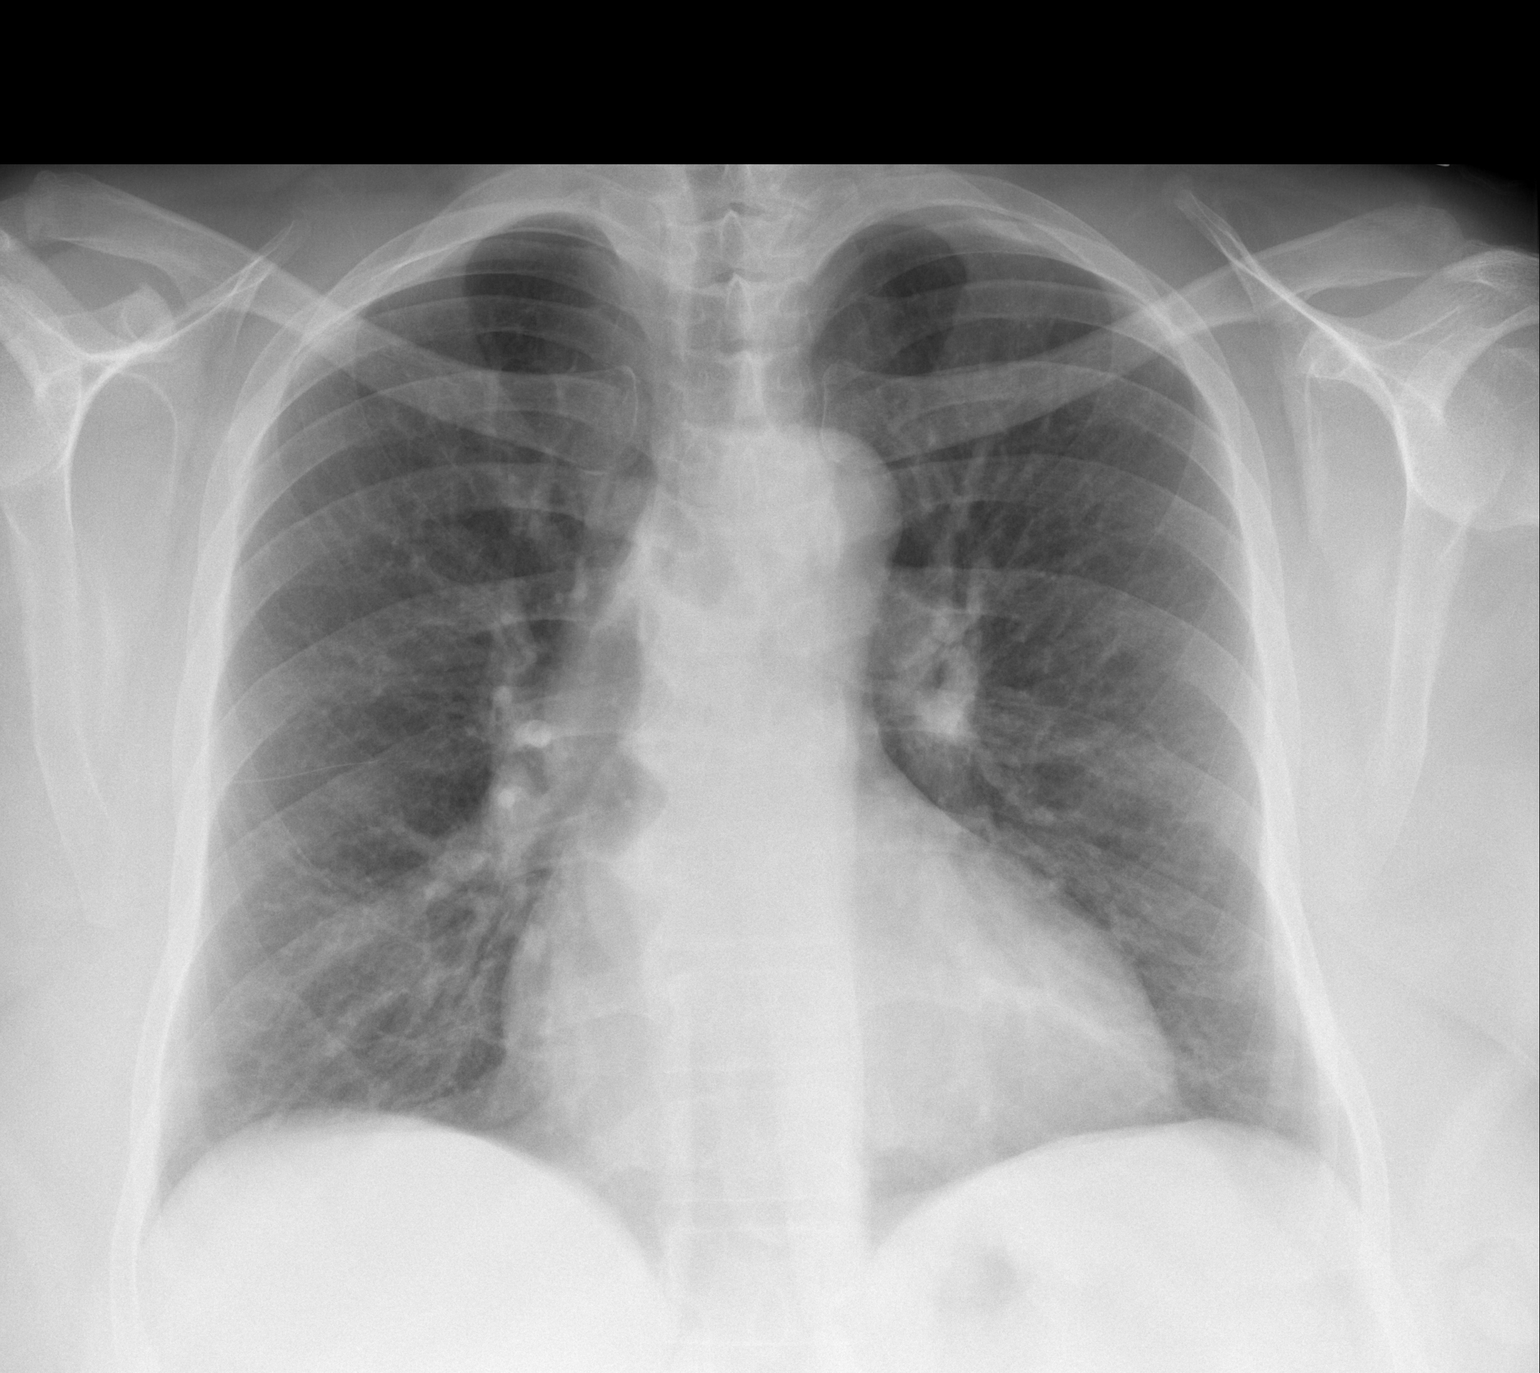

[w chest lat]
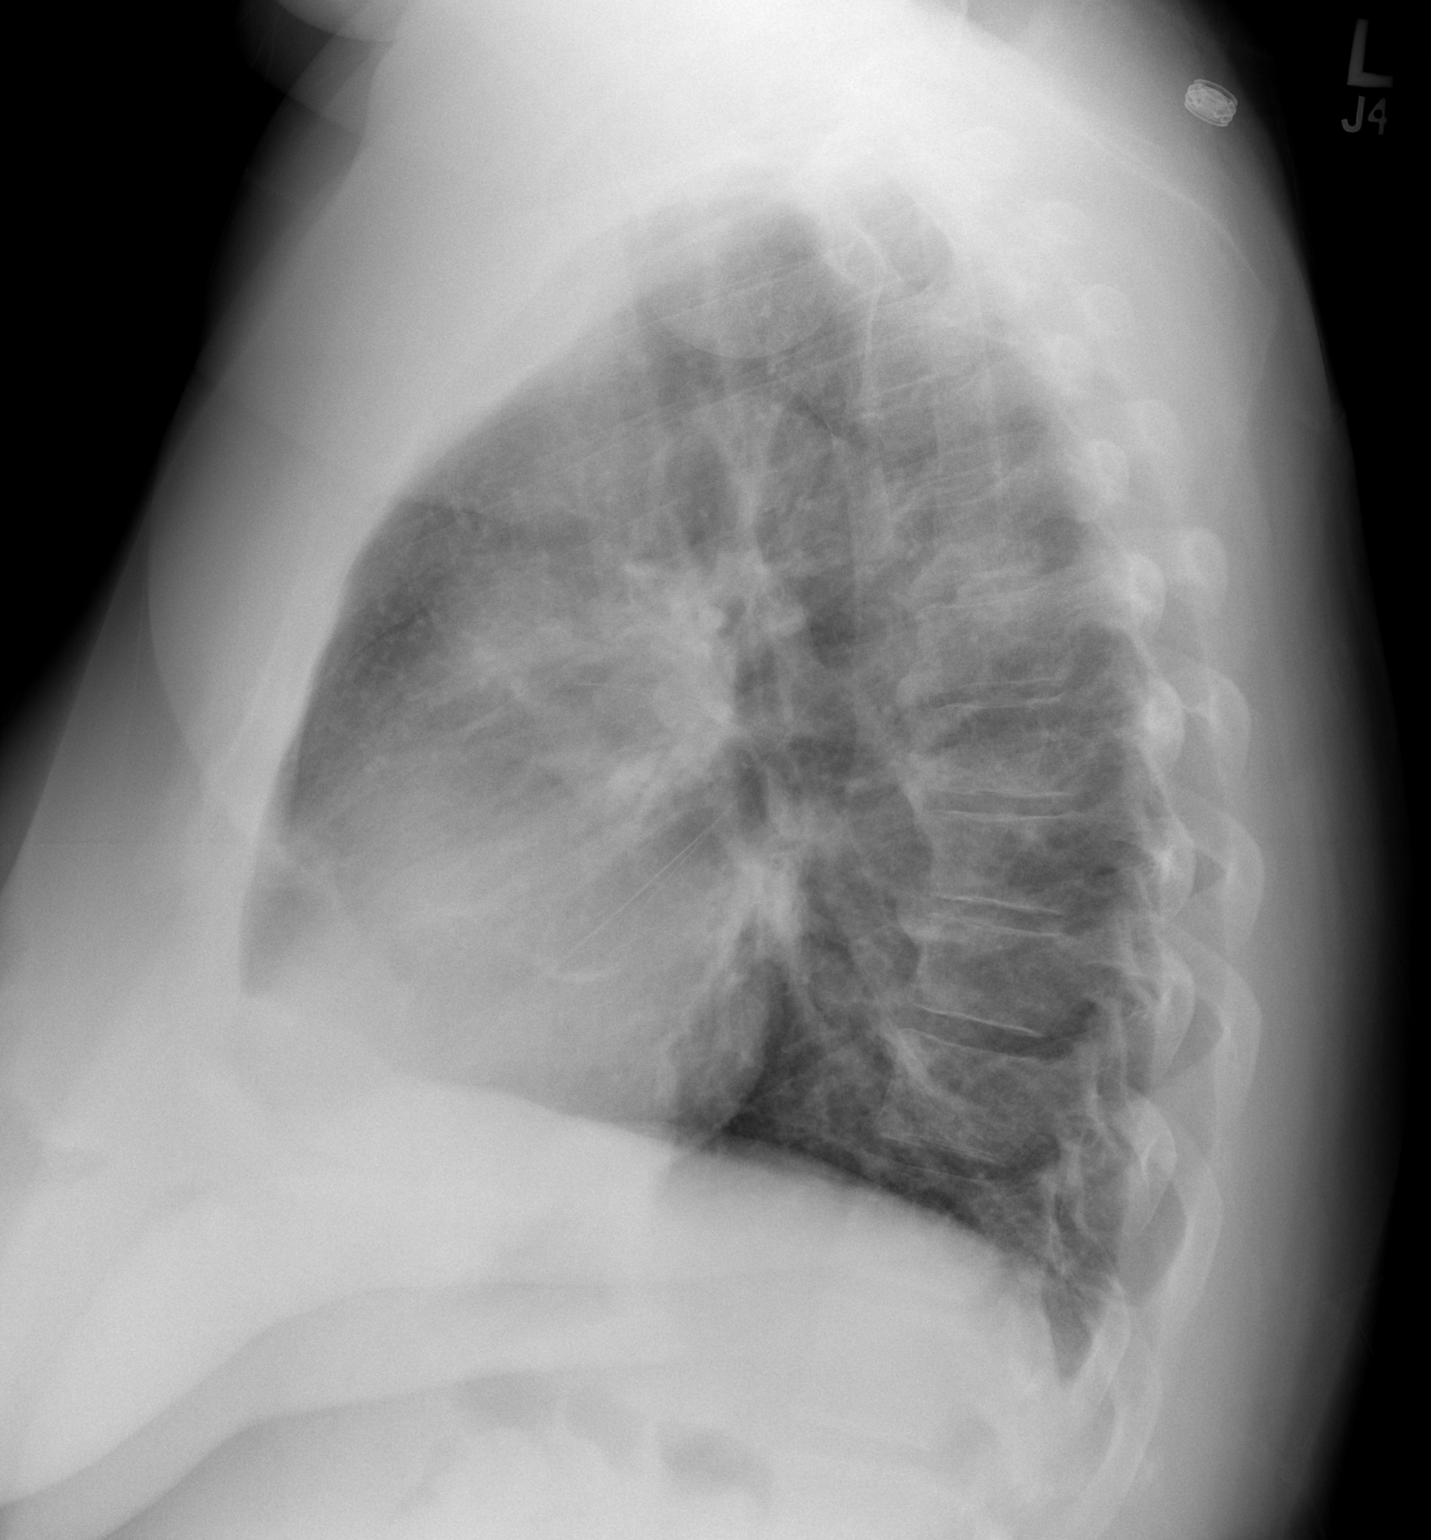

[2 of 2 positions shown; findings below may reference images not displayed]

FINDINGS: Mild cardiac enlargement with normal pulmonary
vascularity.  No focal airspace consolidation in the lungs.  No
blunting of costophrenic angles.  Mild emphysematous changes.
Degenerative changes in the spine.  No significant change since
previous study.
IMPRESSION: Cardiac enlargement.  No evidence of active pulmonary disease.

## 2013-05-22 ENCOUNTER — Ambulatory Visit: Payer: No Typology Code available for payment source | Admitting: Physical Therapy

## 2013-05-22 DIAGNOSIS — IMO0001 Reserved for inherently not codable concepts without codable children: Secondary | ICD-10-CM | POA: Diagnosis not present

## 2013-05-23 ENCOUNTER — Ambulatory Visit: Payer: No Typology Code available for payment source

## 2013-05-23 DIAGNOSIS — IMO0001 Reserved for inherently not codable concepts without codable children: Secondary | ICD-10-CM | POA: Diagnosis not present

## 2013-05-26 ENCOUNTER — Encounter: Payer: Medicaid Other | Admitting: Rehabilitation

## 2013-05-28 ENCOUNTER — Encounter: Payer: Medicaid Other | Admitting: Rehabilitation

## 2013-05-30 ENCOUNTER — Encounter: Payer: Self-pay | Admitting: Pulmonary Disease

## 2013-05-30 ENCOUNTER — Ambulatory Visit (INDEPENDENT_AMBULATORY_CARE_PROVIDER_SITE_OTHER): Payer: Medicaid Other | Admitting: Pulmonary Disease

## 2013-05-30 VITALS — BP 138/80 | HR 105 | Ht 65.0 in | Wt 255.6 lb

## 2013-05-30 DIAGNOSIS — G4733 Obstructive sleep apnea (adult) (pediatric): Secondary | ICD-10-CM

## 2013-05-30 NOTE — Assessment & Plan Note (Signed)
The patient has known moderate obstructive sleep apnea, but has been completely intolerant of CPAP. She does not feel this is a viable therapy for her, but is willing to look at a dental appliance. I have stressed the importance of aggressive weight loss, and will make the referral to dental medicine.

## 2013-05-30 NOTE — Progress Notes (Signed)
   Subjective:    Patient ID: Michele Owens, female    DOB: April 17, 1960, 53 y.o.   MRN: [id number]  HPI The patient comes in today for followup of her obstructive sleep apnea. She has been trying to adapt to CPAP, but has been unable to do so. She has tried different masks, and we have tried different pressure delivery methods.  The patient does not think this is a viable therapy for her.   Review of Systems  Constitutional: Negative for fever and unexpected weight change.  HENT: Positive for congestion. Negative for dental problem, ear pain, nosebleeds, postnasal drip, rhinorrhea, sinus pressure, sneezing, sore throat and trouble swallowing.   Eyes: Negative for redness and itching.  Respiratory: Positive for cough. Negative for chest tightness, shortness of breath and wheezing.   Cardiovascular: Negative for palpitations and leg swelling.  Gastrointestinal: Negative for nausea and vomiting.  Genitourinary: Negative for dysuria.  Musculoskeletal: Negative for joint swelling.  Skin: Negative for rash.  Neurological: Negative for headaches.  Hematological: Does not bruise/bleed easily.  Psychiatric/Behavioral: Negative for dysphoric mood. The patient is not nervous/anxious.        Objective:   Physical Exam Obese female in no acute distress Nose without purulence or discharge noted No skin breakdown or pressure necrosis from the CPAP mask Neck without lymphadenopathy or thyromegaly Lower extremities with mild edema, no cyanosis Alert and oriented, moves all 4 extremities.       Assessment & Plan:

## 2013-05-30 NOTE — Patient Instructions (Signed)
Work on [employer] loss Will refer you to Dr. Ron Parker for consideration of a dental appliance.

## 2013-06-03 ENCOUNTER — Ambulatory Visit: Payer: No Typology Code available for payment source | Admitting: Rehabilitation

## 2013-07-01 ENCOUNTER — Telehealth: Payer: Self-pay | Admitting: Pulmonary Disease

## 2013-07-01 NOTE — Telephone Encounter (Signed)
Spoke with pt-- Aware of recs per [facility] Pt states that she has been using Albuterol Nebs BID and this helps with her sleep at night some. Pt also using humidifier at night in her room and reports this helps some too with sleep.  Pt does not wish to start CPAP at this time but will definitely let us know if she decides to.  Will send to [facility] as FYI.

## 2013-07-01 NOTE — Telephone Encounter (Signed)
Let her know to continue with weight loss as much as she can, and to let us know if she wishes to give cpap another try.

## 2013-07-01 NOTE — Telephone Encounter (Signed)
Will forward to [facility] as an [place]

## 2013-07-23 ENCOUNTER — Telehealth: Payer: Self-pay | Admitting: Cardiology

## 2013-07-23 NOTE — Telephone Encounter (Signed)
Can you please this encounter Michele Owens

## 2013-07-23 NOTE — Telephone Encounter (Signed)
Can you please close this encounter

## 2013-07-23 NOTE — Telephone Encounter (Signed)
LMTCB

## 2013-07-23 NOTE — Telephone Encounter (Signed)
New message         Pt had a panic attack last night. Pt fell on the floor hard and blacked out and it took her a long time to get back up. Pt has lost 10 pounds.

## 2013-07-23 NOTE — Telephone Encounter (Signed)
Patient states she was walking last night, she slipped on her floor runner, and she fell. She states she did not pass out. She fell on her side and her left arm is swollen and sore. She can use her left arm this morning. She has some swelling in her left ankle and right foot. She was told she had gout. She was given medication for this and she has follow up scheduled for this.  Pt states she just wants Dr Aundra Dubin to know this happened.

## 2013-07-23 NOTE — Telephone Encounter (Signed)
Follow up    Pt returned RN's call please give her a call back.

## 2013-07-31 ENCOUNTER — Emergency Department (INDEPENDENT_AMBULATORY_CARE_PROVIDER_SITE_OTHER)
Admission: EM | Admit: 2013-07-31 | Discharge: 2013-07-31 | Disposition: A | Discharge status: Home / Self Care | Payer: Medicaid Other | Source: Home / Self Care

## 2013-07-31 ENCOUNTER — Encounter (HOSPITAL_COMMUNITY): Payer: Self-pay | Admitting: Emergency Medicine

## 2013-07-31 DIAGNOSIS — L089 Local infection of the skin and subcutaneous tissue, unspecified: Secondary | ICD-10-CM

## 2013-07-31 MED ORDER — CLINDAMYCIN HCL 300 MG PO CAPS: 300.0000 mg | ORAL_CAPSULE | Freq: Four times a day (QID) | ORAL | Status: DC

## 2013-07-31 NOTE — ED Provider Notes (Signed)
CSN: [id number]     Arrival date & time 07/31/13  1047 History   First MD Initiated Contact with Patient 07/31/13 1207     Chief Complaint  Patient presents with  . Abscess   (Consider location/radiation/quality/duration/timing/severity/associated sxs/prior Treatment) HPI Comments: 53 year old morbidly obese female is complaining of a "boil" on her left labia. She states it has been draining for a couple of days. She has a history of small pustules that developed in the perineal and genital area on a recurrent basis.   Past Medical History  Diagnosis Date  . Hypertension   . Pain in joint, pelvic region and thigh     back and hip pain  . HLD (hyperlipidemia)   . COPD (chronic obstructive pulmonary disease)   . Tobacco abuse   . Obesity   . Ovarian cyst   . GERD (gastroesophageal reflux disease)   . 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  Marland Kitchen Hx of echocardiogram     a. Echo 4/13:   EF 55-60%.  . Depression   . Hx of cardiovascular stress test     a. ETT-MV 03/2012:   EF 60%, no ischemia    . OSA (obstructive sleep apnea)     a. sleep study 1/14:  mod OSA with API of 29/hr and O2 sat nadir of 80%   Past Surgical History  Procedure Laterality Date  . Total abdominal hysterectomy  07/14/10   Family History  Problem Relation Age of Onset  . Cancer Father    History  Substance Use Topics  . Smoking status: Current Every Day Smoker -- 0.50 packs/day for 39 years    Types: Cigarettes  . Smokeless tobacco: Never Used     Comment: pt down to .5 pack/day 05/30/13  . Alcohol Use: Yes     Comment: occasionally   OB History   Grav Para Term Preterm Abortions TAB SAB Ect Mult Living                 Review of Systems  Skin:       As per history of present illness  All other systems reviewed and are negative.   Allergies  Ibuprofen  Home Medications   Prior to Admission medications   Medication Sig  Start Date End Date Taking? Authorizing Provider  COLCHICINE PO Take by mouth.   Yes Historical Provider, MD  albuterol (PROVENTIL HFA;VENTOLIN HFA) 108 (90 BASE) MCG/ACT inhaler Inhale 1-2 puffs into the lungs every 6 (six) hours as needed for wheezing. 04/09/12   Billy Fischer, MD  cyclobenzaprine (FLEXERIL) 10 MG tablet Take 1 tablet (10 mg total) by mouth 2 (two) times daily as needed for muscle spasms. 04/11/13   Tanna Furry, MD  metoprolol succinate (TOPROL-XL) 50 MG 24 hr tablet Take 1 tablet (50 mg total) by mouth daily. 03/19/12   Liliane Shi, PA-C  omeprazole (PRILOSEC) 40 MG capsule Take 40 mg by mouth daily.    Historical Provider, MD  PARoxetine (PAXIL) 20 MG tablet Take 20 mg by mouth daily.    Historical Provider, MD  pravastatin (PRAVACHOL) 20 MG tablet Take 1 tablet (20 mg total) by mouth daily. 07/22/12   [name], MD  traMADol (ULTRAM) 50 MG tablet Take 1 tablet (50 mg total) by mouth every 6 (six) hours as needed. 04/11/13   Tanna Furry, MD   There were no vitals taken for this visit. Physical  Exam  Nursing note and vitals reviewed. Constitutional: She is oriented to person, place, and time. She appears well-developed. No distress.  Genitourinary: No vaginal discharge found.  Proximally 5 mm pustular type lesion to the left labia majora. No current drainage. There is no fluctuance or induration. No erythema, lymphangitis or surrounding swelling or edema.  Neurological: She is alert and oriented to person, place, and time. She exhibits abnormal muscle tone.  Skin: Skin is warm and dry.  Psychiatric: She has a normal mood and affect.    ED Course  Procedures (including critical care time) Labs Review Labs Reviewed - No data to display  Imaging Review No results found.   MDM   1. Skin pustule     Clindamycin 300 bid for 7 d. Called into her pharmacy [phone] Warm compresses.    Janne Napoleon, NP 07/31/13 1251

## 2013-07-31 NOTE — ED Provider Notes (Signed)
Medical screening examination/treatment/procedure(s) were performed by a resident physician or non-physician practitioner and as the supervising physician I was immediately available for consultation/collaboration.  Lynne Leader, MD    Gregor Hams, MD 07/31/13 2116

## 2013-07-31 NOTE — Discharge Instructions (Signed)
Keep area clean and dry Warm compresses several times a day. Clindamycin as directed.  See your doctor. If worse may return.

## 2013-07-31 NOTE — ED Notes (Signed)
Pt  Reports  Swollen  Tender area that has  Been  Draining   For  About 1  Week            She  Reports  Has  Had  Similar    Symptoms  In  The past          Pt ambulated  To room  With a  Steady  Fluid  Gait

## 2013-09-18 ENCOUNTER — Other Ambulatory Visit: Payer: Self-pay | Admitting: Internal Medicine

## 2013-09-18 DIAGNOSIS — N6325 Unspecified lump in the left breast, overlapping quadrants: Secondary | ICD-10-CM

## 2013-09-18 DIAGNOSIS — N6320 Unspecified lump in the left breast, unspecified quadrant: Principal | ICD-10-CM

## 2013-09-18 DIAGNOSIS — N644 Mastodynia: Secondary | ICD-10-CM

## 2013-09-26 ENCOUNTER — Ambulatory Visit
Admission: RE | Admit: 2013-09-26 | Discharge: 2013-09-26 | Disposition: A | Discharge status: Home / Self Care | Payer: Medicaid Other | Source: Ambulatory Visit | Attending: Internal Medicine | Admitting: Internal Medicine

## 2013-09-26 ENCOUNTER — Encounter (INDEPENDENT_AMBULATORY_CARE_PROVIDER_SITE_OTHER): Payer: Self-pay

## 2013-09-26 DIAGNOSIS — N6320 Unspecified lump in the left breast, unspecified quadrant: Principal | ICD-10-CM

## 2013-09-26 DIAGNOSIS — N644 Mastodynia: Secondary | ICD-10-CM

## 2013-09-26 DIAGNOSIS — N6325 Unspecified lump in the left breast, overlapping quadrants: Secondary | ICD-10-CM

## 2013-11-04 IMAGING — CR DG CHEST 2V
2 series · 2 of 2 positions shown · non-contrast
Comparison: Two-view chest x-ray 09/14/2010, 09/26/2007 [REDACTED], and 01/07/2007 [HOSPITAL].

CLINICAL DATA: Chest pain.

CHEST - 2 VIEW 03/01/2011:

[w chest pa]
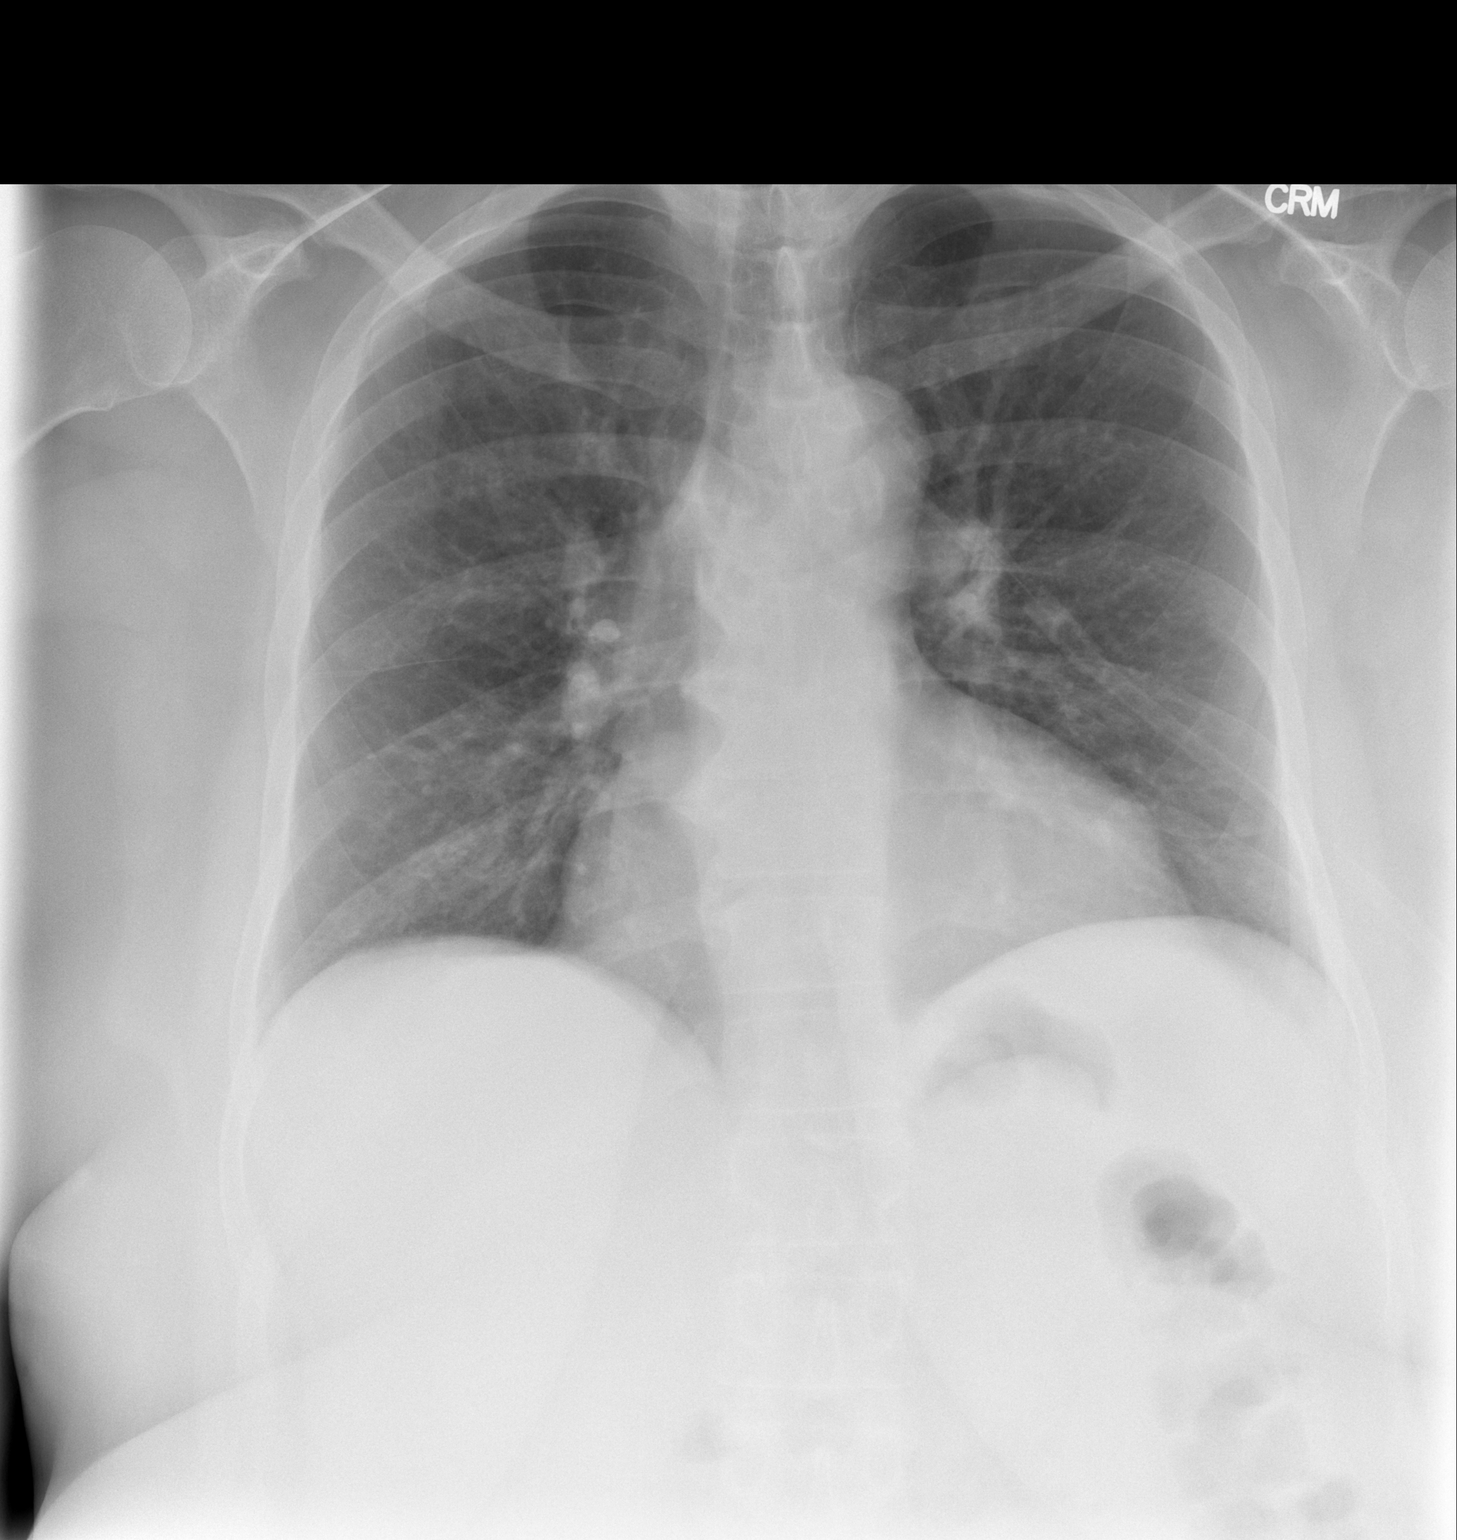

[w chest lat]
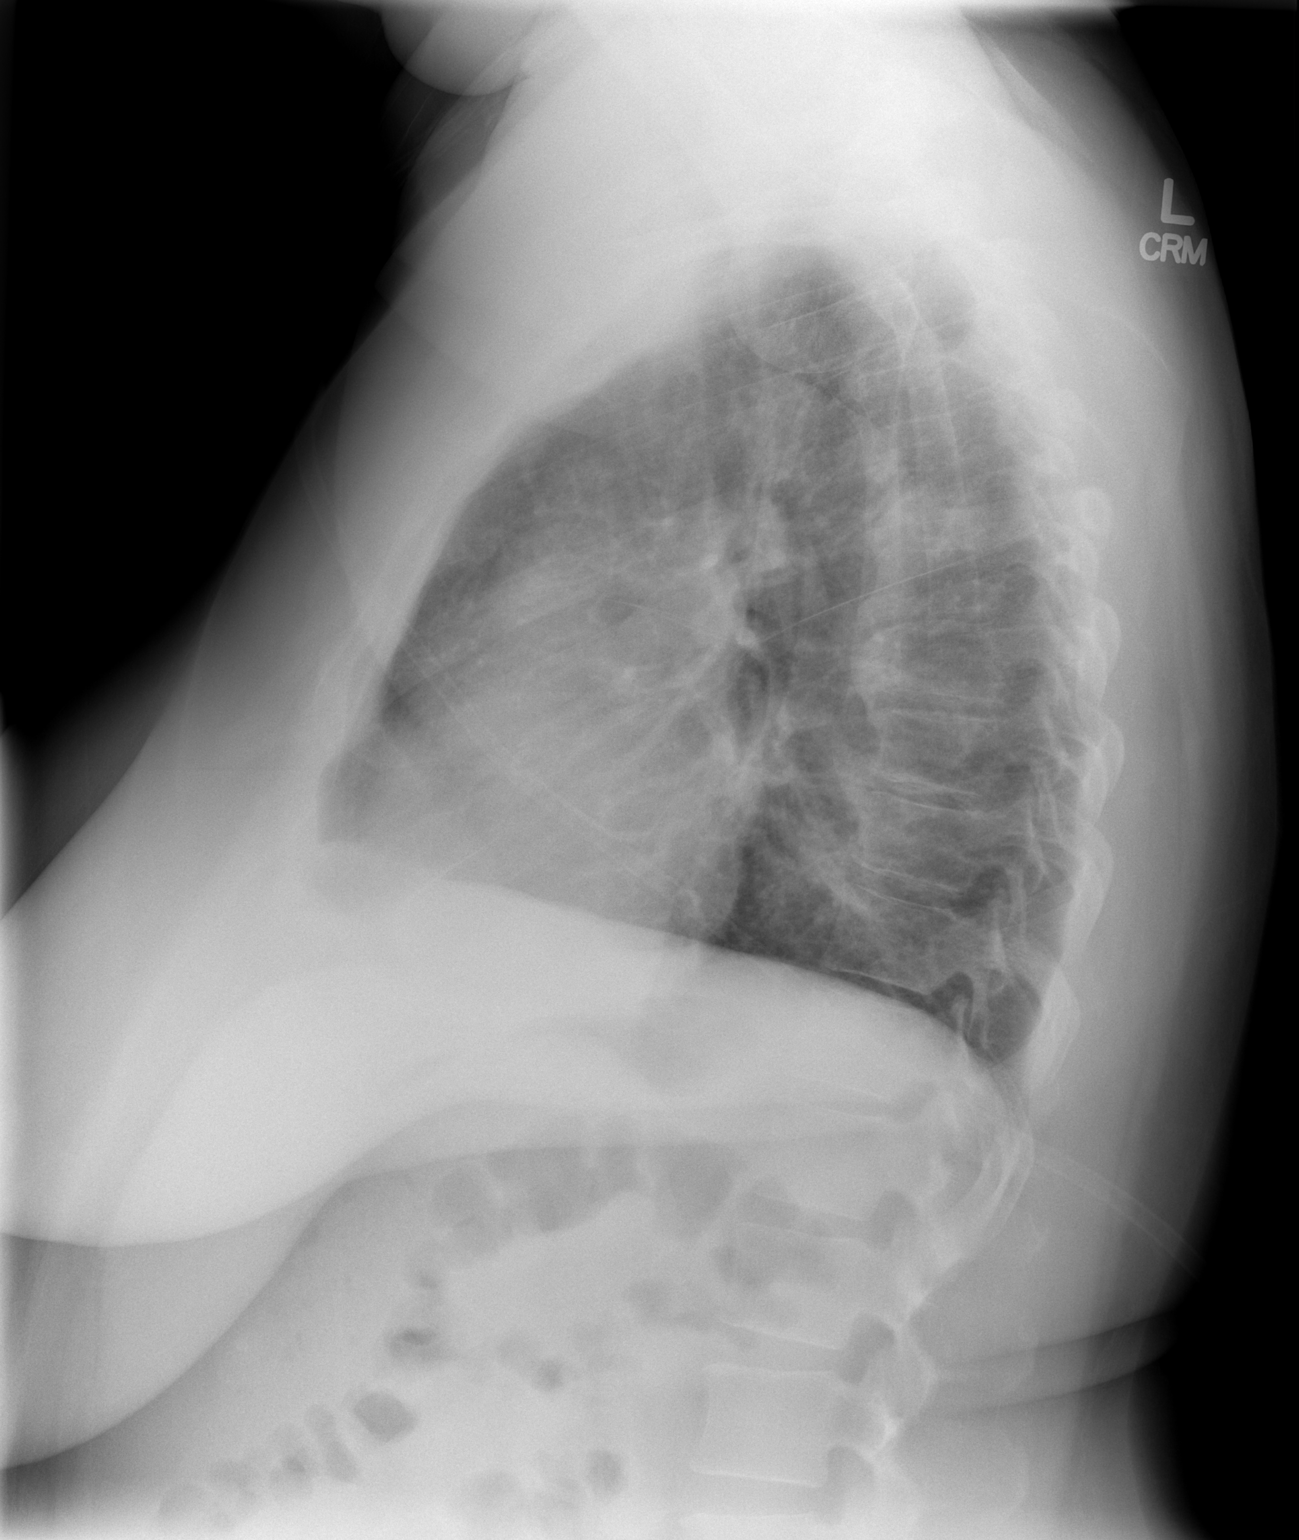

[2 of 2 positions shown; findings below may reference images not displayed]

FINDINGS: Suboptimal inspiration accounts for crowded
bronchovascular markings, especially in the lung bases, and
accentuates the cardiac silhouette.  Taking this into account,
cardiac silhouette upper normal in size to slightly enlarged but
stable.  Lungs clear.  Pulmonary vascularity normal.
Bronchovascular markings normal.  No pleural effusions.
Degenerative changes involving the thoracic spine.
IMPRESSION: Stable borderline to mild cardiomegaly.  No acute cardiopulmonary
disease.

## 2014-01-20 ENCOUNTER — Ambulatory Visit (INDEPENDENT_AMBULATORY_CARE_PROVIDER_SITE_OTHER): Payer: Medicaid Other | Admitting: Podiatry

## 2014-01-20 ENCOUNTER — Encounter: Payer: Self-pay | Admitting: Podiatry

## 2014-01-20 VITALS — BP 129/72 | HR 104 | Resp 16

## 2014-01-20 DIAGNOSIS — L600 Ingrowing nail: Secondary | ICD-10-CM

## 2014-01-20 DIAGNOSIS — L603 Nail dystrophy: Secondary | ICD-10-CM

## 2014-01-20 DIAGNOSIS — B351 Tinea unguium: Secondary | ICD-10-CM

## 2014-01-20 NOTE — Progress Notes (Signed)
She presents today concerned about the second digit of the right foot. She states that the toenail appears to be thick and it appears to have fungus growing beneath it. She denies any trauma to the foot and denies any history of fungus previously.  Objective: Vital signs are stable she is alert and oriented x3. There is no erythema edema cellulitis drainage or odor. She has hammertoe deformities the second digit of the right foot with mallet toe deformity being prominent second right. Second toenail does not demonstrate any signs of fungus at this point wart and nail dystrophy. However we cannot completely rule out the possibility of nail fungus.  Assessment: Nail dystrophy second digit right foot. Rule out onychomycosis.  Plan: Samples of the nail plate from ten over two were taken today and sent for pathologic evaluation we will followup with her once those come in.

## 2014-01-22 ENCOUNTER — Telehealth: Payer: Self-pay | Admitting: *Deleted

## 2014-01-22 DIAGNOSIS — B351 Tinea unguium: Secondary | ICD-10-CM

## 2014-01-22 NOTE — Telephone Encounter (Signed)
Mycotic toenails sent to [facility] for pas stain and culture.

## 2014-01-29 ENCOUNTER — Telehealth: Payer: Self-pay | Admitting: *Deleted

## 2014-01-29 NOTE — Telephone Encounter (Signed)
I called to check on the status of patient's culture.  Michele Owens said it was not in the system yet, they may still be processing it.  There were others that were mailed at the same time that were being processed today.  She asked that we call on tomorrow.

## 2014-02-03 NOTE — Telephone Encounter (Signed)
I called to get the results.  She stated, "It's in the final stages of review, doctor has to sign off on it."

## 2014-02-05 ENCOUNTER — Ambulatory Visit (INDEPENDENT_AMBULATORY_CARE_PROVIDER_SITE_OTHER): Payer: Medicaid Other | Admitting: Podiatry

## 2014-02-05 DIAGNOSIS — L603 Nail dystrophy: Secondary | ICD-10-CM

## 2014-02-05 NOTE — Progress Notes (Signed)
She presents today to have toenail samples taken which were previously sent for pathologic evaluation however the lab lost these.  Samples of the thick dystrophic portion of the nails were taken today expecting to reveal dermatophytosis or onychomycosis.

## 2014-02-17 ENCOUNTER — Encounter: Payer: Self-pay | Admitting: Podiatry

## 2014-02-25 ENCOUNTER — Emergency Department (HOSPITAL_COMMUNITY)
Admission: EM | Admit: 2014-02-25 | Discharge: 2014-02-25 | Disposition: A | Discharge status: Home / Self Care | Payer: Medicaid Other | Attending: Emergency Medicine | Admitting: Emergency Medicine

## 2014-02-25 ENCOUNTER — Emergency Department (HOSPITAL_COMMUNITY): Payer: Medicaid Other

## 2014-02-25 ENCOUNTER — Encounter (HOSPITAL_COMMUNITY): Payer: Self-pay | Admitting: Emergency Medicine

## 2014-02-25 DIAGNOSIS — Z9981 Dependence on supplemental oxygen: Secondary | ICD-10-CM | POA: Diagnosis not present

## 2014-02-25 DIAGNOSIS — J45901 Unspecified asthma with (acute) exacerbation: Secondary | ICD-10-CM

## 2014-02-25 DIAGNOSIS — Z79899 Other long term (current) drug therapy: Secondary | ICD-10-CM | POA: Diagnosis not present

## 2014-02-25 DIAGNOSIS — J441 Chronic obstructive pulmonary disease with (acute) exacerbation: Secondary | ICD-10-CM | POA: Diagnosis not present

## 2014-02-25 DIAGNOSIS — E669 Obesity, unspecified: Secondary | ICD-10-CM | POA: Insufficient documentation

## 2014-02-25 DIAGNOSIS — Z791 Long term (current) use of non-steroidal anti-inflammatories (NSAID): Secondary | ICD-10-CM | POA: Insufficient documentation

## 2014-02-25 DIAGNOSIS — G4733 Obstructive sleep apnea (adult) (pediatric): Secondary | ICD-10-CM | POA: Diagnosis not present

## 2014-02-25 DIAGNOSIS — F329 Major depressive disorder, single episode, unspecified: Secondary | ICD-10-CM | POA: Insufficient documentation

## 2014-02-25 DIAGNOSIS — R0602 Shortness of breath: Secondary | ICD-10-CM | POA: Diagnosis present

## 2014-02-25 DIAGNOSIS — Z8742 Personal history of other diseases of the female genital tract: Secondary | ICD-10-CM | POA: Diagnosis not present

## 2014-02-25 DIAGNOSIS — E785 Hyperlipidemia, unspecified: Secondary | ICD-10-CM | POA: Diagnosis not present

## 2014-02-25 DIAGNOSIS — Z792 Long term (current) use of antibiotics: Secondary | ICD-10-CM | POA: Diagnosis not present

## 2014-02-25 DIAGNOSIS — Z720 Tobacco use: Secondary | ICD-10-CM | POA: Insufficient documentation

## 2014-02-25 DIAGNOSIS — I10 Essential (primary) hypertension: Secondary | ICD-10-CM | POA: Diagnosis not present

## 2014-02-25 DIAGNOSIS — K219 Gastro-esophageal reflux disease without esophagitis: Secondary | ICD-10-CM | POA: Diagnosis not present

## 2014-02-25 LAB — COMPREHENSIVE METABOLIC PANEL
ALK PHOS: 97 U/L (ref 39–117)
ALT: 20 U/L (ref 0–35)
ANION GAP: 15 (ref 5–15)
AST: 16 U/L (ref 0–37)
Albumin: 4.1 g/dL (ref 3.5–5.2)
BILIRUBIN TOTAL: 0.3 mg/dL (ref 0.3–1.2)
BUN: 11 mg/dL (ref 6–23)
CHLORIDE: 97 meq/L (ref 96–112)
CO2: 24 mEq/L (ref 19–32)
Calcium: 10.1 mg/dL (ref 8.4–10.5)
Creatinine, Ser: 0.52 mg/dL (ref 0.50–1.10)
GFR calc Af Amer: >90 mL/min (ref >90)
GLUCOSE: 175 mg/dL — AB (ref 70–99)
Potassium: 4.1 mEq/L (ref 3.7–5.3)
SODIUM: 136 meq/L — AB (ref 137–147)
TOTAL PROTEIN: 8.1 g/dL (ref 6.0–8.3)

## 2014-02-25 LAB — CBC
HEMATOCRIT: 42.2 % (ref 36.0–46.0)
Hemoglobin: 13.4 g/dL (ref 12.0–15.0)
MCH: 28.2 pg (ref 26.0–34.0)
MCHC: 31.8 g/dL (ref 30.0–36.0)
MCV: 88.7 fL (ref 78.0–100.0)
Platelets: 345 10*3/uL (ref 150–400)
RBC: 4.76 MIL/uL (ref 3.87–5.11)
RDW: 14.6 % (ref 11.5–15.5)
WBC: 13.6 10*3/uL — ABNORMAL HIGH (ref 4.0–10.5)

## 2014-02-25 LAB — I-STAT TROPONIN, ED: Troponin i, poc: 0.02 ng/mL (ref 0.00–0.08)

## 2014-02-25 MED ORDER — ALBUTEROL (5 MG/ML) CONTINUOUS INHALATION SOLN
10.0000 mg | INHALATION_SOLUTION | RESPIRATORY_TRACT | Status: AC
  Administered 2014-02-25: 10 mg via RESPIRATORY_TRACT
  Filled 2014-02-25: qty 20

## 2014-02-25 MED ORDER — PREDNISONE 20 MG PO TABS: 40.0000 mg | ORAL_TABLET | Freq: Every day | ORAL | Status: DC

## 2014-02-25 NOTE — ED Notes (Signed)
Pt c/o SOB saw PCP today, was sent home without labs or x-ray, per EMS pt was wheezing all fields on arrival with noticeable distress, given 10 mg albuterol en route and 0.5 mg atrovent, 125 mg solu-medrol, and 324 mg aspirin with significant improvement. Pt c/o chest pain radiating to shoulder with deep inspiration. Per EMS pt was 90% O2 on RA on arrival, has since improved to 98%. Pt is talkative and shows no signs of distress at this time.

## 2014-02-25 NOTE — Discharge Instructions (Signed)
Asthma Asthma is a recurring condition in which the airways tighten and narrow. Asthma can make it difficult to breathe. It can cause coughing, wheezing, and shortness of breath. Asthma episodes, also called asthma attacks, range from minor to life-threatening. Asthma cannot be cured, but medicines and lifestyle changes can help control it. CAUSES Asthma is believed to be caused by inherited (genetic) and environmental factors, but its exact cause is unknown. Asthma may be triggered by allergens, lung infections, or irritants in the air. Asthma triggers are different for each person. Common triggers include:   Animal dander.  Dust mites.  Cockroaches.  Pollen from trees or grass.  Mold.  Smoke.  Air pollutants such as dust, household cleaners, hair sprays, aerosol sprays, paint fumes, strong chemicals, or strong odors.  Cold air, weather changes, and winds (which increase molds and pollens in the air).  Strong emotional expressions such as crying or laughing hard.  Stress.  Certain medicines (such as aspirin) or types of drugs (such as beta-blockers).  Sulfites in foods and drinks. Foods and drinks that may contain sulfites include dried fruit, potato chips, and sparkling grape juice.  Infections or inflammatory conditions such as the flu, a cold, or an inflammation of the nasal membranes (rhinitis).  Gastroesophageal reflux disease (GERD).  Exercise or strenuous activity. SYMPTOMS Symptoms may occur immediately after asthma is triggered or many hours later. Symptoms include:  Wheezing.  Excessive nighttime or early morning coughing.  Frequent or severe coughing with a common cold.  Chest tightness.  Shortness of breath. DIAGNOSIS  The diagnosis of asthma is made by a review of your medical history and a physical exam. Tests may also be performed. These may include:  Lung function studies. These tests show how much air you breathe in and out.  Allergy  tests.  Imaging tests such as X-rays. TREATMENT  Asthma cannot be cured, but it can usually be controlled. Treatment involves identifying and avoiding your asthma triggers. It also involves medicines. There are 2 classes of medicine used for asthma treatment:   Controller medicines. These prevent asthma symptoms from occurring. They are usually taken every day.  Reliever or rescue medicines. These quickly relieve asthma symptoms. They are used as needed and provide short-term relief. Your health care provider will help you create an asthma action plan. An asthma action plan is a written plan for managing and treating your asthma attacks. It includes a list of your asthma triggers and how they may be avoided. It also includes information on when medicines should be taken and when their dosage should be changed. An action plan may also involve the use of a device called a peak flow meter. A peak flow meter measures how well the lungs are working. It helps you monitor your condition. HOME CARE INSTRUCTIONS   Take medicines only as directed by your health care provider. Speak with your health care provider if you have questions about how or when to take the medicines.  Use a peak flow meter as directed by your health care provider. Record and keep track of readings.  Understand and use the action plan to help minimize or stop an asthma attack without needing to seek medical care.  Control your home environment in the following ways to help prevent asthma attacks:  Do not smoke. Avoid being exposed to secondhand smoke.  Change your heating and air conditioning filter regularly.  Limit your use of fireplaces and wood stoves.  Get rid of pests (such as roaches and  mice) and their droppings.  Throw away plants if you see mold on them.  Clean your floors and dust regularly. Use unscented cleaning products.  Try to have someone else vacuum for you regularly. Stay out of rooms while they are  being vacuumed and for a short while afterward. If you vacuum, use a dust mask from a hardware store, a double-layered or microfilter vacuum cleaner bag, or a vacuum cleaner with a HEPA filter.  Replace carpet with wood, tile, or vinyl flooring. Carpet can trap dander and dust.  Use allergy-proof pillows, mattress covers, and box spring covers.  Wash bed sheets and blankets every week in hot water and dry them in a dryer.  Use blankets that are made of polyester or cotton.  Clean bathrooms and kitchens with bleach. If possible, have someone repaint the walls in these rooms with mold-resistant paint. Keep out of the rooms that are being cleaned and painted.  Wash hands frequently. SEEK MEDICAL CARE IF:   You have wheezing, shortness of breath, or a cough even if taking medicine to prevent attacks.  The colored mucus you cough up (sputum) is thicker than usual.  Your sputum changes from clear or white to yellow, green, gray, or bloody.  You have any problems that may be related to the medicines you are taking (such as a rash, itching, swelling, or trouble breathing).  You are using a reliever medicine more than 2-3 times per week.  Your peak flow is still at 50-79% of your personal best after following your action plan for 1 hour.  You have a fever. SEEK IMMEDIATE MEDICAL CARE IF:   You seem to be getting worse and are unresponsive to treatment during an asthma attack.  You are short of breath even at rest.  You get short of breath when doing very little physical activity.  You have difficulty eating, drinking, or talking due to asthma symptoms.  You develop chest pain.  You develop a fast heartbeat.  You have a bluish color to your lips or fingernails.  You are light-headed, dizzy, or faint.  Your peak flow is less than 50% of your personal best. MAKE SURE YOU:   Understand these instructions.  Will watch your condition.  Will get help right away if you are not  doing well or get worse. Document Released: 03/27/2005 Document Revised: 08/11/2013 Document Reviewed: 10/24/2012 [facility] Patient Information 2015 [place], [place]. This information is not intended to replace advice given to you by your health care provider. Make sure you discuss any questions you have with your health care provider.  Smoking Cessation Quitting smoking is important to your health and has many advantages. However, it is not always easy to quit since nicotine is a very addictive drug. Oftentimes, people try 3 times or more before being able to quit. This document explains the best ways for you to prepare to quit smoking. Quitting takes hard work and a lot of effort, but you can do it. ADVANTAGES OF QUITTING SMOKING  You will live longer, feel better, and live better.  Your body will feel the impact of quitting smoking almost immediately.  Within 20 minutes, blood pressure decreases. Your pulse returns to its normal level.  After 8 hours, carbon monoxide levels in the blood return to normal. Your oxygen level increases.  After 24 hours, the chance of having a heart attack starts to decrease. Your breath, hair, and body stop smelling like smoke.  After 48 hours, damaged nerve endings begin to  recover. Your sense of taste and smell improve.  After 72 hours, the body is virtually free of nicotine. Your bronchial tubes relax and breathing becomes easier.  After 2 to 12 weeks, lungs can hold more air. Exercise becomes easier and circulation improves.  The risk of having a heart attack, stroke, cancer, or lung disease is greatly reduced.  After 1 year, the risk of coronary heart disease is cut in half.  After 5 years, the risk of stroke falls to the same as a nonsmoker.  After 10 years, the risk of lung cancer is cut in half and the risk of other cancers decreases significantly.  After 15 years, the risk of coronary heart disease drops, usually to the level of a  nonsmoker.  If you are pregnant, quitting smoking will improve your chances of having a healthy baby.  The people you live with, especially any children, will be healthier.  You will have extra money to spend on things other than cigarettes. QUESTIONS TO THINK ABOUT BEFORE ATTEMPTING TO QUIT You may want to talk about your answers with your health care provider.  Why do you want to quit?  If you tried to quit in the past, what helped and what did not?  What will be the most difficult situations for you after you quit? How will you plan to handle them?  Who can help you through the tough times? Your family? Friends? A health care provider?  What pleasures do you get from smoking? What ways can you still get pleasure if you quit? Here are some questions to ask your health care provider:  How can you help me to be successful at quitting?  What medicine do you think would be best for me and how should I take it?  What should I do if I need more help?  What is smoking withdrawal like? How can I get information on withdrawal? GET READY  Set a quit date.  Change your environment by getting rid of all cigarettes, ashtrays, matches, and lighters in your home, car, or work. Do not let people smoke in your home.  Review your past attempts to quit. Think about what worked and what did not. GET SUPPORT AND ENCOURAGEMENT You have a better chance of being successful if you have help. You can get support in many ways.  Tell your family, friends, and coworkers that you are going to quit and need their support. Ask them not to smoke around you.  Get individual, group, or telephone counseling and support. Programs are available at [employer] and health centers. Call your local health department for information about programs in your area.  Spiritual beliefs and practices may help some smokers quit.  Download a "quit meter" on your computer to keep track of quit statistics, such as how  long you have gone without smoking, cigarettes not smoked, and money saved.  Get a self-help book about quitting smoking and staying off tobacco. [place] yourself from urges to smoke. Talk to someone, go for a walk, or occupy your time with a task.  Change your normal routine. Take a different route to work. Drink tea instead of coffee. Eat breakfast in a different place.  Reduce your stress. Take a hot bath, exercise, or read a book.  Plan something enjoyable to do every day. Reward yourself for not smoking.  Explore interactive web-based programs that specialize in helping you quit. GET MEDICINE AND USE IT CORRECTLY Medicines can  help you stop smoking and decrease the urge to smoke. Combining medicine with the above behavioral methods and support can greatly increase your chances of successfully quitting smoking.  Nicotine replacement therapy helps deliver nicotine to your body without the negative effects and risks of smoking. Nicotine replacement therapy includes nicotine gum, lozenges, inhalers, nasal sprays, and skin patches. Some may be available over-the-counter and others require a prescription.  Antidepressant medicine helps people abstain from smoking, but how this works is unknown. This medicine is available by prescription.  Nicotinic receptor partial agonist medicine simulates the effect of nicotine in your brain. This medicine is available by prescription. Ask your health care provider for advice about which medicines to use and how to use them based on your health history. Your health care provider will tell you what side effects to look out for if you choose to be on a medicine or therapy. Carefully read the information on the package. Do not use any other product containing nicotine while using a nicotine replacement product.  RELAPSE OR DIFFICULT SITUATIONS Most relapses occur within the first 3 months after quitting. Do not be discouraged  if you start smoking again. Remember, most people try several times before finally quitting. You may have symptoms of withdrawal because your body is used to nicotine. You may crave cigarettes, be irritable, feel very hungry, cough often, get headaches, or have difficulty concentrating. The withdrawal symptoms are only temporary. They are strongest when you first quit, but they will go away within 10-14 days. To reduce the chances of relapse, try to:  Avoid drinking alcohol. Drinking lowers your chances of successfully quitting.  Reduce the amount of caffeine you consume. Once you quit smoking, the amount of caffeine in your body increases and can give you symptoms, such as a rapid heartbeat, sweating, and anxiety.  Avoid smokers because they can make you want to smoke.  Do not let weight gain distract you. Many smokers will gain weight when they quit, usually less than 10 pounds. Eat a healthy diet and stay active. You can always lose the weight gained after you quit.  Find ways to improve your mood other than smoking. FOR MORE INFORMATION  www.smokefree.gov  Document Released: 03/21/2001 Document Revised: 08/11/2013 Document Reviewed: 07/06/2011 [facility] Patient Information 2015 [place], [place]. This information is not intended to replace advice given to you by your health care provider. Make sure you discuss any questions you have with your health care provider.

## 2014-02-25 NOTE — ED Notes (Signed)
Respiratory Amie called for continous neb

## 2014-02-25 NOTE — ED Provider Notes (Signed)
CSN: [id number]     Arrival date & time 02/25/14  7824 History   First MD Initiated Contact with Patient 02/25/14 1951     Chief Complaint  Patient presents with  . Shortness of Breath     (Consider location/radiation/quality/duration/timing/severity/associated sxs/prior Treatment) HPI  A 53 year old female with a past medical history of hypertension, hyperlipidemia, COPD, tobacco abuse, obesity and obstructive sleep apnea who presents to the emergency department with chief complaint of shortness of breath. The patient states that she has had URI symptoms for several days. She has had worsening wheezing and shortness of breath over the past few days. Patient states she went to see her primary care physician today who thought she might "have a touch of pneumonia." The patient states that they were going to wait to do a chest x-ray until Friday. Patient states he can't understand why that would wait that long. Attention here at the emergency department. She denies fevers. She is awake, sputum production. Wheezing, chest tightness. Patient states today that she felt a lot of tightness on her right side that radiated into her right shoulder blade. She denies chest pain, nausea, diaphoresis, vomiting.  Past Medical History  Diagnosis Date  . Hypertension   . Pain in joint, pelvic region and thigh     back and hip pain  . HLD (hyperlipidemia)   . COPD (chronic obstructive pulmonary disease)   . Tobacco abuse   . Obesity   . Ovarian cyst   . GERD (gastroesophageal reflux disease)   . 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  Marland Kitchen Hx of echocardiogram     a. Echo 4/13:   EF 55-60%.  . Depression   . Hx of cardiovascular stress test     a. ETT-MV 03/2012:   EF 60%, no ischemia    . OSA (obstructive sleep apnea)     a. sleep study 1/14:  mod OSA with API of 29/hr and O2 sat nadir of 80%   Past Surgical History  Procedure  Laterality Date  . Total abdominal hysterectomy  07/14/10   Family History  Problem Relation Age of Onset  . Cancer Father    History  Substance Use Topics  . Smoking status: Current Every Day Smoker -- 0.50 packs/day for 39 years    Types: Cigarettes  . Smokeless tobacco: Never Used     Comment: pt down to .5 pack/day 05/30/13  . Alcohol Use: Yes     Comment: occasionally   OB History    No data available     Review of Systems  Ten systems are reviewed and are negative for acute change except as noted in the HPI  Allergies  Ibuprofen  Home Medications   Prior to Admission medications   Medication Sig Start Date End Date Taking? Authorizing Provider  albuterol (PROVENTIL HFA;VENTOLIN HFA) 108 (90 BASE) MCG/ACT inhaler Inhale 1-2 puffs into the lungs every 6 (six) hours as needed for wheezing. 04/09/12  Yes Billy Fischer, MD  albuterol (PROVENTIL) (2.5 MG/3ML) 0.083% nebulizer solution Take 5 mg by nebulization every 6 (six) hours as needed for wheezing or shortness of breath.   Yes Historical Provider, MD  azithromycin (ZITHROMAX) 250 MG tablet Take 250 mg by mouth daily. Take 2 tablets by mouth on day 1, then take 1 tablet by mouth on days 2-5   Yes Historical Provider, MD  clonazePAM (KLONOPIN) 1 MG tablet Take 1 mg  by mouth daily.   Yes Historical Provider, MD  COLCHICINE PO Take 1 tablet by mouth 2 (two) times daily.    Yes Historical Provider, MD  cyclobenzaprine (FLEXERIL) 10 MG tablet Take 1 tablet (10 mg total) by mouth 2 (two) times daily as needed for muscle spasms. 04/11/13  Yes Tanna Furry, MD  furosemide (LASIX) 20 MG tablet Take 20 mg by mouth daily.   Yes Historical Provider, MD  HYDROcodone-acetaminophen (NORCO/VICODIN) 5-325 MG per tablet Take 1 tablet by mouth 2 (two) times daily as needed for moderate pain.   Yes Historical Provider, MD  metoprolol succinate (TOPROL-XL) 50 MG 24 hr tablet Take 1 tablet (50 mg total) by mouth daily. 03/19/12  Yes Scott T Kathlen Mody,  PA-C  naproxen (NAPROSYN) 500 MG tablet Take 500 mg by mouth 2 (two) times daily with a meal.   Yes Historical Provider, MD  nicotine (NICODERM CQ - DOSED IN MG/24 HOURS) 21 mg/24hr patch Place 21 mg onto the skin daily.   Yes Historical Provider, MD  omeprazole (PRILOSEC) 40 MG capsule Take 40 mg by mouth daily.   Yes Historical Provider, MD  pravastatin (PRAVACHOL) 20 MG tablet Take 1 tablet (20 mg total) by mouth daily. Patient taking differently: Take 40 mg by mouth daily.  07/22/12  Yes [name], MD  zolpidem (AMBIEN) 10 MG tablet Take 10 mg by mouth at bedtime as needed for sleep.   Yes Historical Provider, MD  PARoxetine (PAXIL) 20 MG tablet Take 20 mg by mouth daily.    Historical Provider, MD  traMADol (ULTRAM) 50 MG tablet Take 1 tablet (50 mg total) by mouth every 6 (six) hours as needed. 04/11/13   Tanna Furry, MD   BP 133/69 mmHg  Pulse 89  Temp(Src) 97.6 F (36.4 C) (Oral)  Resp 19  SpO2 98% Physical Exam  Constitutional: She is oriented to person, place, and time. She appears well-developed and well-nourished. No distress.  HENT:  Head: Normocephalic and atraumatic.  Eyes: Conjunctivae are normal. No scleral icterus.  Neck: Normal range of motion.  Cardiovascular: Normal rate, regular rhythm and normal heart sounds.  Exam reveals no gallop and no friction rub.   No murmur heard. Pulmonary/Chest: Effort normal. No respiratory distress. She has wheezes.  CHEST: Effort increased, tight coughing,wheezing noted throughout all lung fields., decreased air movement is present, rhonchi noted that clear with cough, no accessory muscle use. Patient speaking in short sentences frequently interrupted by tight wheezing, cough.     Abdominal: Soft. Bowel sounds are normal. She exhibits no distension and no mass. There is no tenderness. There is no guarding.  Neurological: She is alert and oriented to person, place, and time.  Skin: Skin is warm and dry. She is not diaphoretic.   Nursing note and vitals reviewed.   ED Course  Procedures (including critical care time) Labs Review Labs Reviewed  CBC - Abnormal; Notable for the following:    WBC 13.6 (*)    All other components within normal limits  COMPREHENSIVE METABOLIC PANEL  Randolm Idol, ED    Imaging Review Dg Chest 2 View  02/25/2014   CLINICAL DATA:  Shortness of breath.  EXAM: CHEST  2 VIEW  COMPARISON:  April 09, 2012.  FINDINGS: The heart size and mediastinal contours are within normal limits. Right lung is clear. No pneumothorax or pleural effusion is noted. Minimal left basilar subsegmental atelectasis is noted. The visualized skeletal structures are unremarkable.  IMPRESSION: Minimal left basilar subsegmental atelectasis.  Electronically Signed   By: Sabino Dick M.D.   On: 02/25/2014 20:52     EKG Interpretation None      MDM   Final diagnoses:  Asthma with COPD with exacerbation  Tobacco abuse   Here with redness of breath. She is noted to be hypoxic to 90% at home on room air. She is a chronic smoker with a history of COPD. Was likely. This reflects COPD exacerbation. She received 1 albuterol neb treatment with IV, 125 Solu-Medrol. She agreed improvement, however, is still quite tight. I have ordered a 1 hour neb treatment with 10 milligrams, albuterol.   Patient ambulated in ED with O2 saturations maintained >90, no current signs of respiratory distress. Lung exam improved after nebulizer treatment. Prednisone given in the ED and pt will bd dc with 5 day burst. Pt states they are breathing at baseline. Pt has been instructed to continue using prescribed medications and to speak with PCP about today's exacerbation.    Margarita Mail, PA-C 03/01/14 [place] Knapp, MD 03/03/14 1452

## 2014-02-26 ENCOUNTER — Ambulatory Visit: Payer: Medicaid Other | Admitting: Podiatry

## 2014-03-19 ENCOUNTER — Ambulatory Visit: Payer: Medicaid Other | Admitting: Cardiology

## 2014-03-26 ENCOUNTER — Ambulatory Visit: Payer: Medicaid Other | Admitting: Podiatry

## 2014-04-17 ENCOUNTER — Ambulatory Visit (INDEPENDENT_AMBULATORY_CARE_PROVIDER_SITE_OTHER): Payer: Medicaid Other | Admitting: Cardiology

## 2014-04-17 ENCOUNTER — Encounter: Payer: Self-pay | Admitting: Cardiology

## 2014-04-17 VITALS — BP 130/60 | HR 83 | Ht 65.0 in | Wt 280.0 lb

## 2014-04-17 DIAGNOSIS — R55 Syncope and collapse: Secondary | ICD-10-CM

## 2014-04-17 DIAGNOSIS — I471 Supraventricular tachycardia: Secondary | ICD-10-CM

## 2014-04-17 DIAGNOSIS — E669 Obesity, unspecified: Secondary | ICD-10-CM

## 2014-04-17 DIAGNOSIS — F17200 Nicotine dependence, unspecified, uncomplicated: Secondary | ICD-10-CM

## 2014-04-17 DIAGNOSIS — Z720 Tobacco use: Secondary | ICD-10-CM

## 2014-04-17 LAB — BASIC METABOLIC PANEL
BUN: 12 mg/dL (ref 6–23)
CHLORIDE: 101 meq/L (ref 96–112)
CO2: 28 meq/L (ref 19–32)
CREATININE: 0.64 mg/dL (ref 0.50–1.10)
Calcium: 9.2 mg/dL (ref 8.4–10.5)
GLUCOSE: 88 mg/dL (ref 70–99)
POTASSIUM: 3.8 meq/L (ref 3.5–5.3)
Sodium: 139 mEq/L (ref 135–145)

## 2014-04-17 NOTE — Patient Instructions (Signed)
Your physician recommends that you have lab work today--BMET.  You have been referred to Nutrition for weight loss.  Your physician wants you to follow-up in: 1 year with Dr Aundra Dubin. (January 2017).  You will receive a reminder letter in the mail two months in advance. If you don't receive a letter, please call our office to schedule the follow-up appointment.

## 2014-04-19 NOTE — Progress Notes (Signed)
Patient ID: Michele Owens, female   DOB: 06/30/1960, 54 y.o.   MRN: [id number] PCP: Dr. Jonelle Sidle  54 yo with history of HTN, obesity, OSA, hyperlipidemia, and SVT presents for cardiology evaluation.  She has a history of atypical chest pain.  ETT-Cardiolite in 12/13 was normal.  Echo in 4/13 showed normal EF.   She has been fairly stable.  She has not had may episodes of palpitations since I last saw her.  She cut back on coffee which tended to help this.  Her heart does race when she gets upset or stressed.  Weight is up 29 lbs unfortunately.  She is trying to walk for exercise.  She was unable to tolerate CPAP.  She continues to smoke.  No lightheadedness or syncope.  No chest pain.  No dyspnea but fatigues easily with weight gain.  Her left knee hurts with walking and she may need a left TKR.   ECG: NSR, poor RWP  Labs (11/12): K 3.6, creatinine 0.73 Labs (12/13): K 3.9, creatinine 0.9, BNP 22 Labs (11/14): K 3.9, creatinine 0.5  PMH: 1. Back and hip pain 2. HTN 3. Obesity 4. GERD 5. Hyperlipidemia 6. Depression 7. COPD: Active smoker 8. 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 Event monitor (12/14) with only NSR noted.  10. Chest pain: Atypical.  ETT-Cardiolite (12/13) with EF 60%, no ischemia/infarction.  11. Syncope: Uncertain etiology.  Echo (4/13) with EF 55-60%, no significant valvular abnormalities.  12. OSA: Cannot tolerate CPAP.  13. Gout 14. Migraines  SH: Smoker.  Lives alone.  Does not use drugs or ETOH.   FH: Father with cancer, mother with diabetes, brother with fen-phen related valvulopathy  ROS: All systems reviewed and negative except as per HPI.   Current Outpatient Prescriptions  Medication Sig Dispense Refill  . albuterol (PROVENTIL HFA;VENTOLIN HFA) 108 (90 BASE) MCG/ACT inhaler Inhale 1-2 puffs into the lungs every 6 (six) hours as needed for wheezing. 1 Inhaler 0  . albuterol (PROVENTIL) (2.5 MG/3ML) 0.083% nebulizer  solution Take 5 mg by nebulization every 6 (six) hours as needed for wheezing or shortness of breath.    . clonazePAM (KLONOPIN) 1 MG tablet Take 1 mg by mouth daily.    . COLCHICINE PO Take 1 tablet by mouth 2 (two) times daily.     . cyclobenzaprine (FLEXERIL) 10 MG tablet Take 1 tablet (10 mg total) by mouth 2 (two) times daily as needed for muscle spasms. 20 tablet 0  . furosemide (LASIX) 20 MG tablet Take 20 mg by mouth daily.    Marland Kitchen HYDROcodone-acetaminophen (NORCO/VICODIN) 5-325 MG per tablet Take 1 tablet by mouth 2 (two) times daily as needed for moderate pain.    . metoprolol succinate (TOPROL-XL) 50 MG 24 hr tablet Take 1 tablet (50 mg total) by mouth daily. 30 tablet 11  . naproxen (NAPROSYN) 500 MG tablet Take 500 mg by mouth 2 (two) times daily with a meal.    . omeprazole (PRILOSEC) 40 MG capsule Take 40 mg by mouth daily.    . pravastatin (PRAVACHOL) 20 MG tablet Take 1 tablet (20 mg total) by mouth daily. (Patient taking differently: Take 40 mg by mouth daily. ) 30 tablet 2  . predniSONE (DELTASONE) 20 MG tablet Take 2 tablets (40 mg total) by mouth daily. 10 tablet 0  . traMADol (ULTRAM) 50 MG tablet Take 1 tablet (50 mg total) by mouth every 6 (six) hours as needed. 15 tablet 0  .  zolpidem (AMBIEN) 10 MG tablet Take 10 mg by mouth at bedtime as needed for sleep.     No current facility-administered medications for this visit.    BP 130/60 mmHg  Pulse 83  Ht 5\' 5"  (1.651 m)  Wt 280 lb (127.007 kg)  BMI 46.59 kg/m2 General: NAD, obese.  Neck: No JVD, no thyromegaly or thyroid nodule.  Lungs: Clear to auscultation bilaterally with normal respiratory effort. CV: Nondisplaced PMI.  Heart regular S1/S2, no S3/S4, 1/6 SEM RUSB.  No peripheral edema.  No carotid bruit.  Normal pedal pulses.  Abdomen: Soft, nontender, no hepatosplenomegaly, no distention.  Skin: Intact without lesions or rashes.  Neurologic: Alert and oriented x 3.  Psych: Normal affect.  Extremities: No  clubbing or cyanosis.   Assessment/Plan: 1. Chest pain: None recently.  She had a normal Cardiolite in 12/13.  2. Obesity: Needs weight loss.  Will need aggressive diet/exercise. Weight up 29 lbs since last appointment.  I will refer her to nutritionist.  Unfortunately, knee pain will limit exercise.  Would be reasonable for her to look into bariatric surgery.   3. Palpitations:  She has a history of SVT.  She wore an event monitor but did not have significant symptoms or any arrhythmia noted while the monitor was on. This has not been much of a problem recently. - Continue to follow for now, continue Toprol XL.  - If she passes out again or has frequent bothersome tachypalpitations, could consider implanted loop recorder.  4. OSA: Unable to tolerate CPAP.    5. Smoking: I strongly encouraged her to quit smoking.   Loralie Champagne 04/19/2014

## 2014-04-23 ENCOUNTER — Ambulatory Visit: Payer: Medicaid Other | Admitting: Podiatry

## 2014-04-30 ENCOUNTER — Ambulatory Visit: Payer: Medicaid Other | Admitting: Podiatry

## 2014-05-12 ENCOUNTER — Ambulatory Visit: Payer: Medicaid Other | Admitting: Dietician

## 2014-05-15 ENCOUNTER — Ambulatory Visit: Payer: Medicaid Other | Admitting: Dietician

## 2014-05-19 ENCOUNTER — Encounter: Payer: Self-pay | Admitting: Podiatry

## 2014-05-19 ENCOUNTER — Ambulatory Visit (INDEPENDENT_AMBULATORY_CARE_PROVIDER_SITE_OTHER): Payer: Medicaid Other | Admitting: Podiatry

## 2014-05-19 VITALS — BP 120/78 | HR 101 | Resp 16

## 2014-05-19 DIAGNOSIS — B351 Tinea unguium: Secondary | ICD-10-CM

## 2014-05-19 DIAGNOSIS — Z79899 Other long term (current) drug therapy: Secondary | ICD-10-CM

## 2014-05-19 MED ORDER — TERBINAFINE HCL 250 MG PO TABS: 250.0000 mg | ORAL_TABLET | Freq: Every day | ORAL | Status: DC

## 2014-05-19 NOTE — Progress Notes (Signed)
She presents today for follow-up of her onychomycosis and her pathology report which did come back positive.  Objective: Vital signs are stable she is alert and oriented 3 pulses are palpable. Pathology report come back positive for onychomycosis.  Assessment: Onychomycosis bilateral.  Plan: Started her on Lamisil 250 mg tablets #30 one by mouth daily also requested a CBC and liver profile. Follow up with her in 1 month

## 2014-05-19 NOTE — Patient Instructions (Signed)
Terbinafine tablets What is this medicine? TERBINAFINE (TER bin a feen) is an antifungal medicine. It is used to treat certain kinds of fungal or yeast infections. This medicine may be used for other purposes; ask your health care provider or pharmacist if you have questions. COMMON BRAND NAME(S): Lamisil, Terbinex What should I tell my health care provider before I take this medicine? They need to know if you have any of these conditions: -drink alcoholic beverages -kidney disease -liver disease -an unusual or allergic reaction to terbinafine, other medicines, foods, dyes, or preservatives -pregnant or trying to get pregnant -breast-feeding How should I use this medicine? Take this medicine by mouth with a full glass of water. Follow the directions on the prescription label. You can take this medicine with food or on an empty stomach. Take your medicine at regular intervals. Do not take your medicine more often than directed. Do not skip doses or stop your medicine early even if you feel better. Do not stop taking except on your doctor's advice. Talk to your pediatrician regarding the use of this medicine in children. Special care may be needed. Overdosage: If you think you have taken too much of this medicine contact a poison control center or emergency room at once. NOTE: This medicine is only for you. Do not share this medicine with others. What if I miss a dose? If you miss a dose, take it as soon as you can. If it is almost time for your next dose, take only that dose. Do not take double or extra doses. What may interact with this medicine? Do not take this medicine with any of the following medications: -thioridazine This medicine may also interact with the following medications: -beta-blockers -caffeine -cimetidine -cyclosporine -medicines for depression, anxiety, or psychotic disturbances -medicines for fungal infections like fluconazole and ketoconazole -medicines for irregular  heartbeat like amiodarone, flecainide and propafenone -rifampin -warfarin This list may not describe all possible interactions. Give your health care provider a list of all the medicines, herbs, non-prescription drugs, or dietary supplements you use. Also tell them if you smoke, drink alcohol, or use illegal drugs. Some items may interact with your medicine. What should I watch for while using this medicine? Visit your doctor or health care provider regularly. Tell your doctor right away if you have nausea or vomiting, loss of appetite, stomach pain on your right upper side, yellow skin, dark urine, light stools, or are over tired. Some fungal infections need many weeks or months of treatment to cure. If you are taking this medicine for a long time, you will need to have important blood work done. What side effects may I notice from receiving this medicine? Side effects that you should report to your doctor or health care professional as soon as possible: -allergic reactions like skin rash or hives, swelling of the face, lips, or tongue -changes in vision -dark urine -fever or infection -general ill feeling or flu-like symptoms -light-colored stools -loss of appetite, nausea -redness, blistering, peeling or loosening of the skin, including inside the mouth -right upper belly pain -unusually weak or tired -yellowing of the eyes or skin Side effects that usually do not require medical attention (report to your doctor or health care professional if they continue or are bothersome): -changes in taste -diarrhea -hair loss -muscle or joint pain -stomach gas -stomach upset This list may not describe all possible side effects. Call your doctor for medical advice about side effects. You may report side effects to FDA at   1-800-FDA-1088. Where should I keep my medicine? Keep out of the reach of children. Store at room temperature below 25 degrees C (77 degrees F). Protect from light. Throw away any  unused medicine after the expiration date. NOTE: This sheet is a summary. It may not cover all possible information. If you have questions about this medicine, talk to your doctor, pharmacist, or health care provider.  2015, [employer]vier/[employer] Standard. (2007-06-07 16:28:07)  

## 2014-05-23 LAB — HEPATIC FUNCTION PANEL
ALT: 19 U/L (ref 0–35)
AST: 18 U/L (ref 0–37)
Albumin: 3.9 g/dL (ref 3.5–5.2)
Alkaline Phosphatase: 87 U/L (ref 39–117)
BILIRUBIN INDIRECT: 0.2 mg/dL (ref 0.2–1.2)
BILIRUBIN TOTAL: 0.3 mg/dL (ref 0.2–1.2)
Bilirubin, Direct: 0.1 mg/dL (ref 0.0–0.3)
TOTAL PROTEIN: 6.6 g/dL (ref 6.0–8.3)

## 2014-05-27 ENCOUNTER — Telehealth: Payer: Self-pay | Admitting: *Deleted

## 2014-05-27 NOTE — Telephone Encounter (Signed)
I called and informed patient, Dr. Milinda Pointer said your blood work is okay to take the Lamisil.  "Okay, I already been taking it.  My stomach was upset yesterday and today.  I was going to call you all today.  My appointment is on the 3rd or something?"  Your appointment is scheduled for March 8.  "Oh okay, thank you."

## 2014-05-28 ENCOUNTER — Telehealth: Payer: Self-pay | Admitting: *Deleted

## 2014-05-28 NOTE — Telephone Encounter (Signed)
Tell her to try taking it at night once she starts back.  She should be off of it for about a week first, then start at night.  If symptoms persist then we should switch her over to a topical if her insurance will cover it.  Not sure it will.

## 2014-05-28 NOTE — Telephone Encounter (Addendum)
Pt states the Lamisil 250mg  is making her feel all weird and dizzy, and falling.  I told pt to stop the medication and I would inform Dr. Milinda Pointer and call with further instructions.  Pt called for orders, Dr. Stephenie Acres orders given.  Pt states understanding.  Pt called again a hour later states yesterday the Lamisil caused her face and eye to swell, and herknees to hurt.  I told pt NOT to take any of the Lamisil at all, and go to the ER if difficultly breathing or increased swelling in face or mouth.  Pt agreed.  I will inform Dr. Milinda Pointer.

## 2014-06-02 ENCOUNTER — Telehealth: Payer: Self-pay | Admitting: *Deleted

## 2014-06-02 MED ORDER — TAVABOROLE 5 % EX SOLN: 1.0000 [drp] | Freq: Every day | CUTANEOUS | Status: DC

## 2014-06-02 NOTE — Telephone Encounter (Addendum)
Pt called states the medicine breaking her out and "turning her all which-a-ways".  I asked the if she had taken the Lamisil again after, being instructed not to take anymore.  Pt states she has not taken anymore Lamisil but want the polish or another medication.  Dr. Stephenie Acres orders for [place] [facility] called to pt and [facility].

## 2014-06-02 NOTE — Telephone Encounter (Signed)
Tell her to make sure not taking lamisil and start on Keradyn.

## 2014-06-04 ENCOUNTER — Telehealth: Payer: Self-pay | Admitting: *Deleted

## 2014-06-04 NOTE — Telephone Encounter (Signed)
OK make an appointment. Make sure she is not on blood thinner.

## 2014-06-04 NOTE — Telephone Encounter (Addendum)
Pt states she can't afford the topical polish Dr. Milinda Pointer prescribed and she would like to discuss getting the toenails removed like on the other foot.  Pt is not on blood thinners and is referred to schedulers for an appt to remove the offending toenails

## 2014-06-05 NOTE — Telephone Encounter (Signed)
Called patient and left a message to schedule an appointment.

## 2014-06-12 ENCOUNTER — Encounter: Payer: Self-pay | Admitting: *Deleted

## 2014-06-12 ENCOUNTER — Other Ambulatory Visit: Payer: Self-pay | Admitting: *Deleted

## 2014-06-16 ENCOUNTER — Ambulatory Visit (INDEPENDENT_AMBULATORY_CARE_PROVIDER_SITE_OTHER): Payer: Medicaid Other | Admitting: Podiatry

## 2014-06-16 ENCOUNTER — Encounter: Payer: Self-pay | Admitting: Podiatry

## 2014-06-16 VITALS — BP 135/82 | HR 68 | Resp 18

## 2014-06-16 DIAGNOSIS — L603 Nail dystrophy: Secondary | ICD-10-CM

## 2014-06-16 DIAGNOSIS — B351 Tinea unguium: Secondary | ICD-10-CM | POA: Diagnosis not present

## 2014-06-16 NOTE — Progress Notes (Signed)
She presents today for 2 toenails that are painful and thickened, hallux right and the second digit right.  Objective: Vital signs are stable she is alert and oriented 3 pulses are palpable. Hallux and second toe right foot demonstrate onychomycosis.  Assessment: Onychomycosis #1 and #2 right foot.  Plan: Debrided the nails for her today.

## 2014-06-23 ENCOUNTER — Ambulatory Visit: Payer: Medicaid Other | Admitting: Dietician

## 2014-07-02 ENCOUNTER — Other Ambulatory Visit: Payer: Self-pay | Admitting: Podiatry

## 2014-07-06 ENCOUNTER — Other Ambulatory Visit: Payer: Self-pay | Admitting: Podiatry

## 2014-08-17 ENCOUNTER — Telehealth: Payer: Self-pay | Admitting: Cardiology

## 2014-08-17 DIAGNOSIS — R634 Abnormal weight loss: Secondary | ICD-10-CM

## 2014-08-17 NOTE — Telephone Encounter (Signed)
New Message       Pt calling stating that she needs another referral to [facility]. Please call back and advise.

## 2014-08-17 NOTE — Telephone Encounter (Signed)
Patient had some health issues that prevented her from seeing the nutritional referral that Dr. Aundra Dubin ordered in January. Reordered referral for patient. Informed patient that the nutrition and diabetes management clinic will be in contact with her to schedule an appointment. Patient verbalized understanding.

## 2014-08-27 ENCOUNTER — Encounter: Payer: Medicaid Other | Attending: Internal Medicine | Admitting: Dietician

## 2014-08-27 ENCOUNTER — Encounter: Payer: Self-pay | Admitting: Dietician

## 2014-08-27 VITALS — Ht 65.0 in | Wt 286.9 lb

## 2014-08-27 DIAGNOSIS — E669 Obesity, unspecified: Secondary | ICD-10-CM | POA: Diagnosis present

## 2014-08-27 DIAGNOSIS — Z6841 Body Mass Index (BMI) 40.0 and over, adult: Secondary | ICD-10-CM | POA: Diagnosis not present

## 2014-08-27 DIAGNOSIS — Z713 Dietary counseling and surveillance: Secondary | ICD-10-CM | POA: Diagnosis not present

## 2014-08-27 NOTE — Patient Instructions (Addendum)
Contact a therapist to talk about depression, anxiety, and binge eating. Walk or ride the bike Monday - Friday 30 minutes per day. Look into joining the Y.  Use Splenda (yellow packets) in coffee instead of sugar. Try one at a time. Have fruit, yogurt, or granola bar [facility] Protein bar) for snack instead of chips/cake/ice cream.  Fill half of your plate with vegetables and 1/4 of your plate with protein and 1/4 of your plate starch. Bake, boil, or grill your meat.  Add chicken to salad. Add vegetables to chicken and rice dinner.

## 2014-08-27 NOTE — Progress Notes (Signed)
  Medical Nutrition Therapy:  Appt start time: 7619 end time:  1115.   Assessment:  Primary concerns today: Michele Owens is here today since she would like to lose weight. Has a worker with her at the appointment. She smokes cigarettes. Has problems with anxiety and cigarettes help her calm. Has feelings of depression but is not suicidal. Declined theurapeutic alternatives card. Talks to aunt which helps. Has a lot of family stress. Cannot read well and states she is "slow".    Has gained "a lot of weight" over the years but is not sure how much she gained over what time period. She is on disability and lives alone. He daughter is "in and out". She does not miss any meals. Eats out about 2 x month when she gets her monthly check.   Michele Owens foods. Has lost weight before by cutting out back on portions and walking. Eats 1/2 gallon of ice cream per day when she feels depressed, which has been everyday lately. Has panic attacks. Has not talked to doctor about depression/anxiety.   Has gout and has been told to avoid red meat.   Preferred Learning Style:   No preference indicated   Learning Readiness:   Ready  MEDICATIONS: see list   DIETARY INTAKE:  24-hr recall:  B ( AM): boiled egg with bacon or bacon, toast and coffee with 10-15 scoops of sugar and flavored creamer or hot chocolate with sugar Snk ( AM): cake or chips L ( PM): hot dog or hamburger or tossed salad Snk ( PM): more snacks, not sure what D ( PM): BBQ chicken and rice  Snk ( PM): 1/2 gallon ice cream through the day Beverages: water, couples sodas, coffee  Usual physical activity: none  Estimated energy needs: 1600 calories 180 g carbohydrates 120 g protein 44 g fat  Progress Towards Goal(s):  In progress.   Nutritional Diagnosis:  [place]-3.3 Overweight/obesity As related to hx of large portions size and excess sugar and fat consumption.  As evidenced by diet recall and BMI of 47.7.    Intervention:  Nutrition counseling  provided. Recommended that she talk to a counselor and Michele Owens, RD about emotional eating. Plan: Contact a therapist to talk about depression, anxiety, and binge eating. Walk or ride the bike Monday - Friday 30 minutes per day. Look into joining the Y.  Use Splenda (yellow packets) in coffee instead of sugar. Try one at a time. Have fruit, yogurt, or granola bar [facility] Protein bar) for snack instead of chips/cake/ice cream.  Fill half of your plate with vegetables and 1/4 of your plate with protein and 1/4 of your plate starch. Bake, boil, or grill your meat.  Add chicken to salad. Add vegetables to chicken and rice dinner.  Teaching Method Utilized:  Visual Auditory Hands on  Handouts given during visit include:  Therapists in the area  My[employer] Handout  Barriers to learning/adherence to lifestyle change: low income, depression/anxiety  Demonstrated degree of understanding via:  Teach Back   Monitoring/Evaluation:  Dietary intake, exercise, and body weight in 1 month(s).

## 2014-09-01 ENCOUNTER — Telehealth: Payer: Self-pay | Admitting: *Deleted

## 2014-09-01 NOTE — Telephone Encounter (Addendum)
Dr. Milinda Pointer wanted me to get [employer] shoes but do I need to get thick pads from [facility].  I gave pt Dr. Stephenie Acres recommendation and also sent her to [employer] for [employer] or a sport type orthotic, if she needed her.

## 2014-09-01 NOTE — Telephone Encounter (Signed)
Note necessarily unless the shoes are uncomfortable.

## 2014-09-24 ENCOUNTER — Ambulatory Visit: Payer: Medicaid Other | Admitting: Podiatry

## 2014-10-05 ENCOUNTER — Ambulatory Visit: Payer: Medicaid Other | Admitting: *Deleted

## 2014-10-20 ENCOUNTER — Other Ambulatory Visit (HOSPITAL_COMMUNITY): Payer: Self-pay | Admitting: Obstetrics and Gynecology

## 2014-10-20 DIAGNOSIS — Z1231 Encounter for screening mammogram for malignant neoplasm of breast: Secondary | ICD-10-CM

## 2014-10-23 ENCOUNTER — Ambulatory Visit (HOSPITAL_COMMUNITY)
Admission: RE | Admit: 2014-10-23 | Discharge: 2014-10-23 | Disposition: A | Discharge status: Home / Self Care | Payer: Medicaid Other | Source: Ambulatory Visit | Attending: Obstetrics and Gynecology | Admitting: Obstetrics and Gynecology

## 2014-10-23 DIAGNOSIS — Z1231 Encounter for screening mammogram for malignant neoplasm of breast: Secondary | ICD-10-CM | POA: Diagnosis present

## 2014-11-03 IMAGING — CR DG CHEST 2V
2 series · 2 of 2 positions shown · non-contrast
Comparison: 03/01/2011

CLINICAL DATA: Chest pain

CHEST - 2 VIEW

[w chest pa]
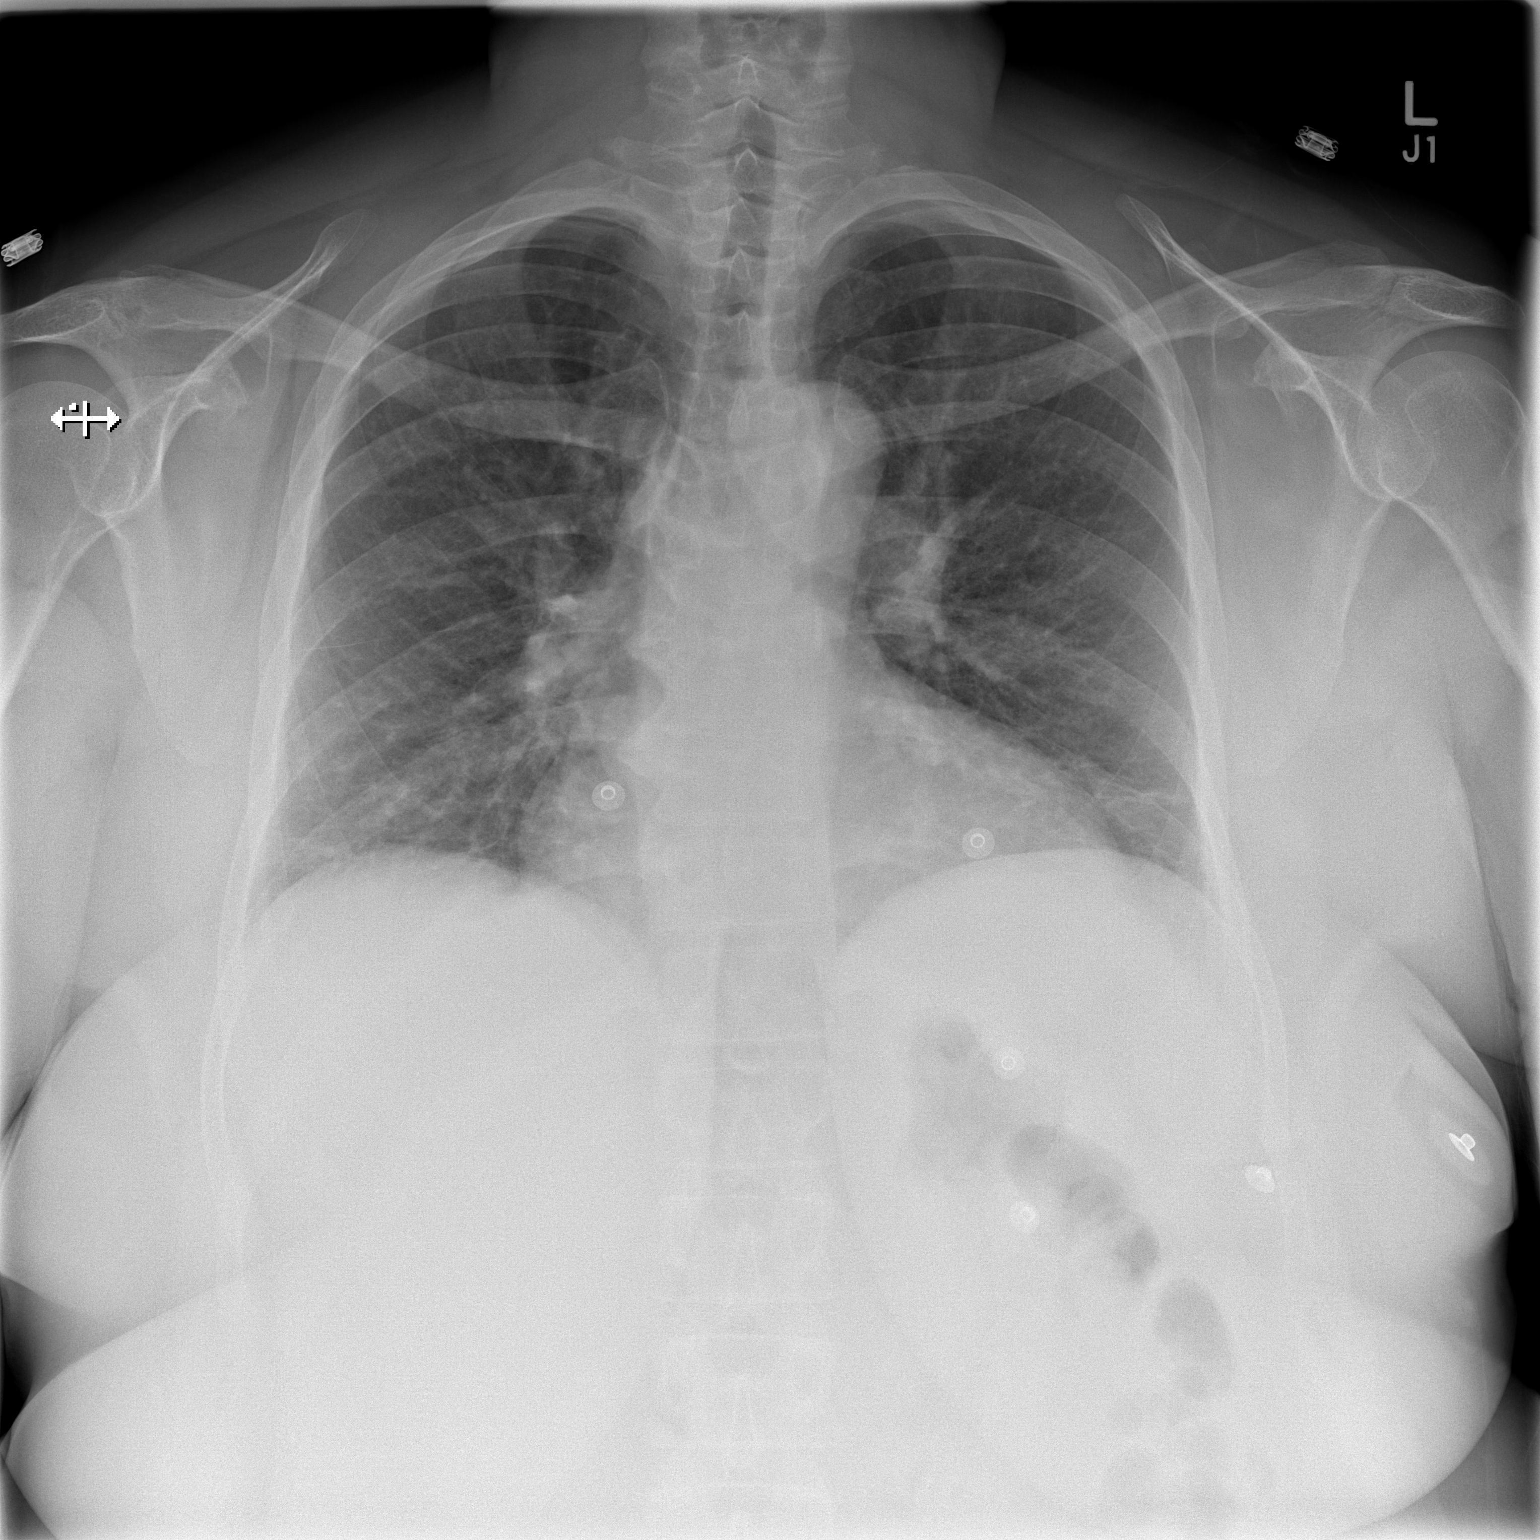

[w chest lat]
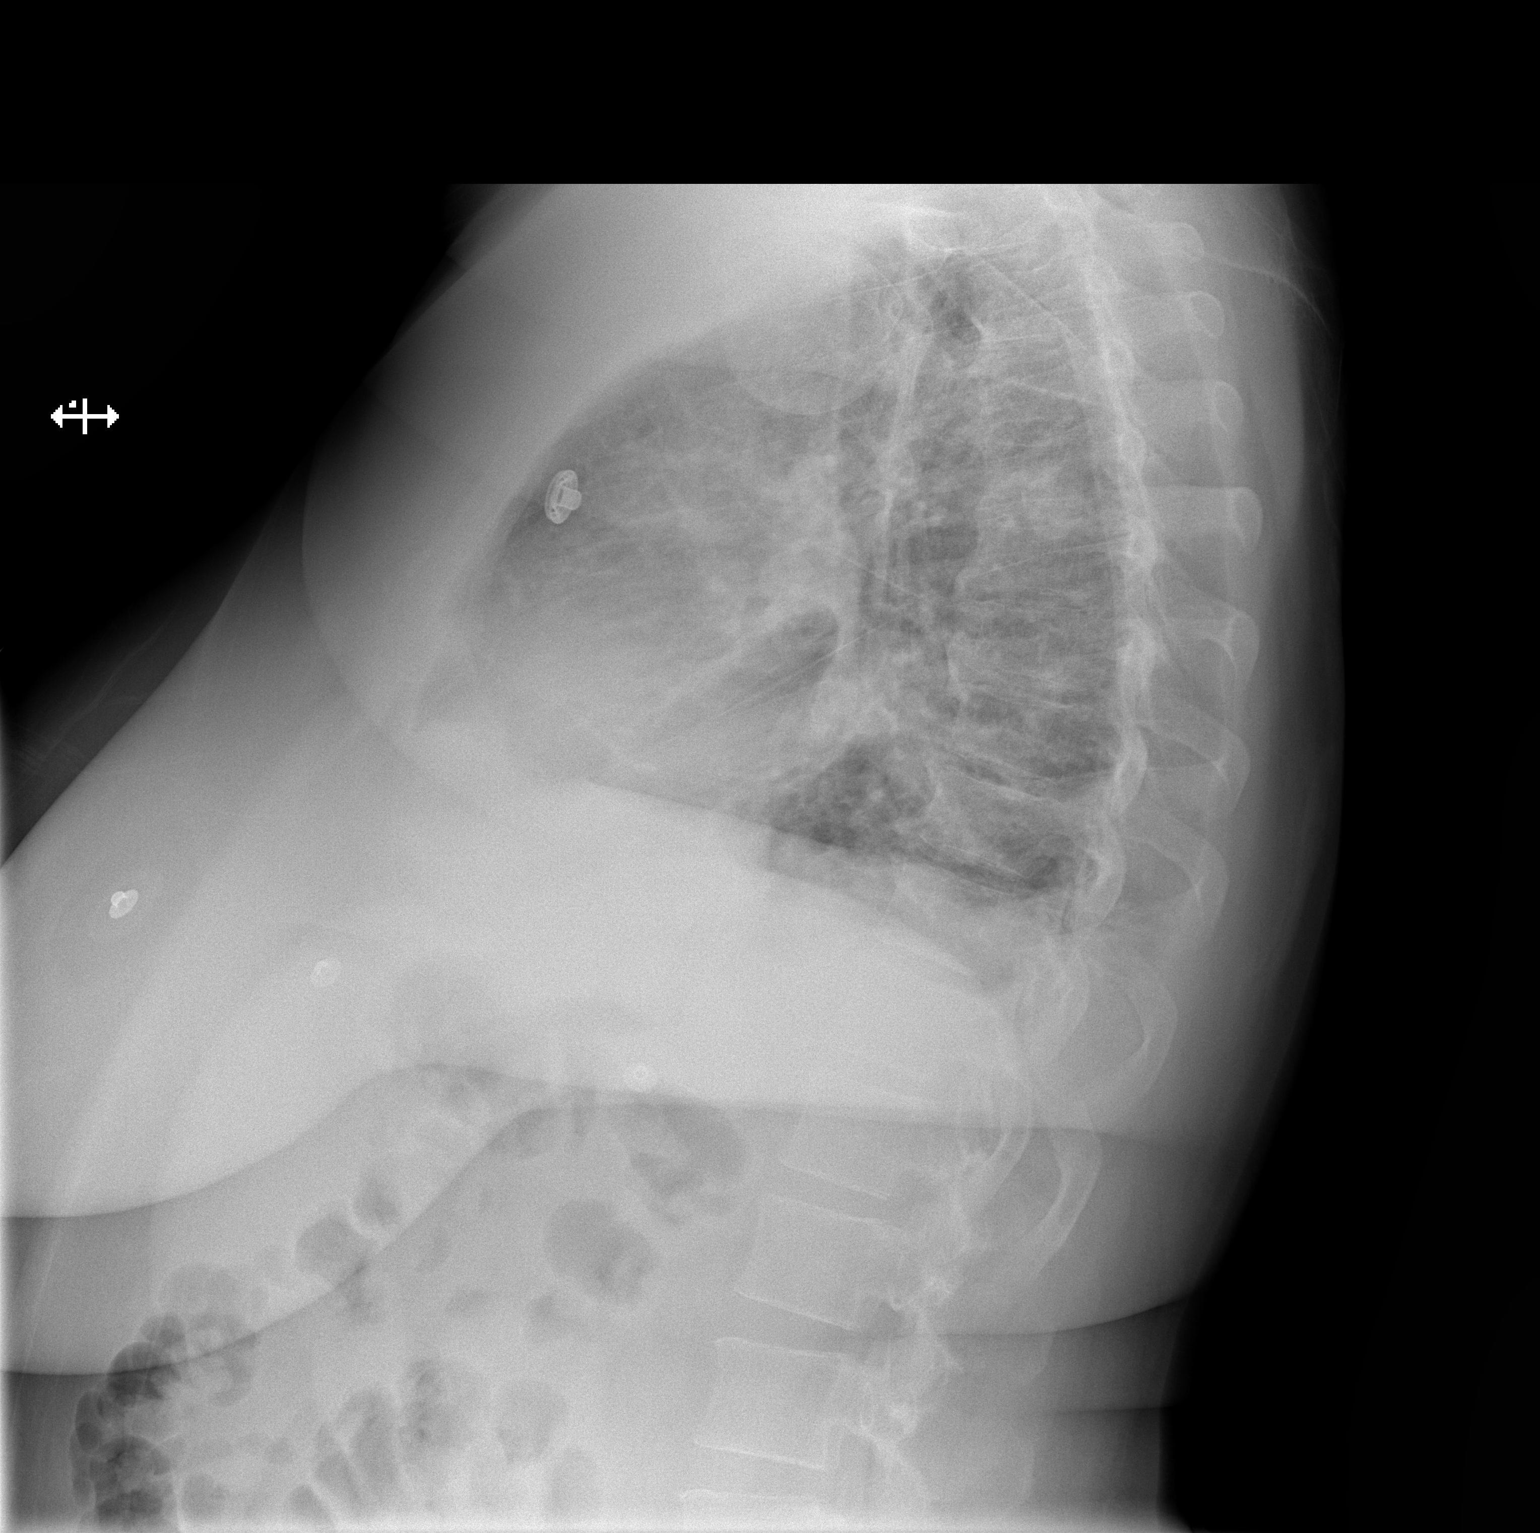

[2 of 2 positions shown; findings below may reference images not displayed]

FINDINGS: Moderate cardiomegaly.  Low lung volumes.  Bibasilar
subsegmental atelectasis.  No consolidation.  No pleural effusion.
No pneumothorax. Round density projects over the mid thoracic spine
and is felt to represent a scapula with rotation.
IMPRESSION: Cardiomegaly without decompensation.  Bibasilar subsegmental
atelectasis.

## 2014-12-07 ENCOUNTER — Emergency Department (HOSPITAL_COMMUNITY): Payer: Medicaid Other

## 2014-12-07 ENCOUNTER — Inpatient Hospital Stay (HOSPITAL_COMMUNITY)
Admission: EM | Admit: 2014-12-07 | Discharge: 2014-12-11 | DRG: 291 | Disposition: A | Discharge status: Home / Self Care | Payer: Medicaid Other | Attending: Internal Medicine | Admitting: Internal Medicine

## 2014-12-07 ENCOUNTER — Encounter (HOSPITAL_COMMUNITY): Payer: Self-pay | Admitting: *Deleted

## 2014-12-07 DIAGNOSIS — I10 Essential (primary) hypertension: Secondary | ICD-10-CM | POA: Diagnosis present

## 2014-12-07 DIAGNOSIS — G4733 Obstructive sleep apnea (adult) (pediatric): Secondary | ICD-10-CM | POA: Diagnosis present

## 2014-12-07 DIAGNOSIS — F17210 Nicotine dependence, cigarettes, uncomplicated: Secondary | ICD-10-CM | POA: Diagnosis present

## 2014-12-07 DIAGNOSIS — E1165 Type 2 diabetes mellitus with hyperglycemia: Secondary | ICD-10-CM | POA: Diagnosis present

## 2014-12-07 DIAGNOSIS — J441 Chronic obstructive pulmonary disease with (acute) exacerbation: Secondary | ICD-10-CM | POA: Insufficient documentation

## 2014-12-07 DIAGNOSIS — E669 Obesity, unspecified: Secondary | ICD-10-CM | POA: Diagnosis present

## 2014-12-07 DIAGNOSIS — J45901 Unspecified asthma with (acute) exacerbation: Secondary | ICD-10-CM | POA: Diagnosis present

## 2014-12-07 DIAGNOSIS — D72829 Elevated white blood cell count, unspecified: Secondary | ICD-10-CM | POA: Diagnosis present

## 2014-12-07 DIAGNOSIS — Z720 Tobacco use: Secondary | ICD-10-CM | POA: Diagnosis present

## 2014-12-07 DIAGNOSIS — Z79891 Long term (current) use of opiate analgesic: Secondary | ICD-10-CM | POA: Diagnosis not present

## 2014-12-07 DIAGNOSIS — M179 Osteoarthritis of knee, unspecified: Secondary | ICD-10-CM | POA: Diagnosis present

## 2014-12-07 DIAGNOSIS — T380X5A Adverse effect of glucocorticoids and synthetic analogues, initial encounter: Secondary | ICD-10-CM | POA: Diagnosis present

## 2014-12-07 DIAGNOSIS — K219 Gastro-esophageal reflux disease without esophagitis: Secondary | ICD-10-CM | POA: Diagnosis present

## 2014-12-07 DIAGNOSIS — Z79899 Other long term (current) drug therapy: Secondary | ICD-10-CM | POA: Diagnosis not present

## 2014-12-07 DIAGNOSIS — Z6841 Body Mass Index (BMI) 40.0 and over, adult: Secondary | ICD-10-CM

## 2014-12-07 DIAGNOSIS — J90 Pleural effusion, not elsewhere classified: Secondary | ICD-10-CM

## 2014-12-07 DIAGNOSIS — E785 Hyperlipidemia, unspecified: Secondary | ICD-10-CM | POA: Diagnosis present

## 2014-12-07 DIAGNOSIS — Z888 Allergy status to other drugs, medicaments and biological substances status: Secondary | ICD-10-CM

## 2014-12-07 DIAGNOSIS — R0902 Hypoxemia: Secondary | ICD-10-CM

## 2014-12-07 DIAGNOSIS — R739 Hyperglycemia, unspecified: Secondary | ICD-10-CM

## 2014-12-07 DIAGNOSIS — F329 Major depressive disorder, single episode, unspecified: Secondary | ICD-10-CM | POA: Diagnosis present

## 2014-12-07 DIAGNOSIS — I5033 Acute on chronic diastolic (congestive) heart failure: Secondary | ICD-10-CM | POA: Diagnosis present

## 2014-12-07 DIAGNOSIS — R0600 Dyspnea, unspecified: Secondary | ICD-10-CM | POA: Diagnosis not present

## 2014-12-07 DIAGNOSIS — F419 Anxiety disorder, unspecified: Secondary | ICD-10-CM | POA: Diagnosis present

## 2014-12-07 DIAGNOSIS — Z7952 Long term (current) use of systemic steroids: Secondary | ICD-10-CM

## 2014-12-07 DIAGNOSIS — J9601 Acute respiratory failure with hypoxia: Secondary | ICD-10-CM | POA: Diagnosis not present

## 2014-12-07 HISTORY — DX: Unspecified osteoarthritis, unspecified site: M19.90

## 2014-12-07 LAB — TROPONIN I
Troponin I: <0.03 ng/mL (ref <0.031)
Troponin I: <0.03 ng/mL (ref <0.031)

## 2014-12-07 LAB — BASIC METABOLIC PANEL
Anion gap: 10 (ref 5–15)
BUN: 8 mg/dL (ref 6–20)
CO2: 27 mmol/L (ref 22–32)
Calcium: 9 mg/dL (ref 8.9–10.3)
Chloride: 101 mmol/L (ref 101–111)
Creatinine, Ser: 0.65 mg/dL (ref 0.44–1.00)
GFR calc Af Amer: >60 mL/min (ref >60)
GLUCOSE: 248 mg/dL — AB (ref 65–99)
POTASSIUM: 4 mmol/L (ref 3.5–5.1)
Sodium: 138 mmol/L (ref 135–145)

## 2014-12-07 LAB — CBC
HCT: 40.1 % (ref 36.0–46.0)
Hemoglobin: 11.7 g/dL — ABNORMAL LOW (ref 12.0–15.0)
MCH: 26.1 pg (ref 26.0–34.0)
MCHC: 29.2 g/dL — ABNORMAL LOW (ref 30.0–36.0)
MCV: 89.3 fL (ref 78.0–100.0)
PLATELETS: 323 10*3/uL (ref 150–400)
RBC: 4.49 MIL/uL (ref 3.87–5.11)
RDW: 15.4 % (ref 11.5–15.5)
WBC: 19.1 10*3/uL — ABNORMAL HIGH (ref 4.0–10.5)

## 2014-12-07 LAB — I-STAT ARTERIAL BLOOD GAS, ED
Acid-Base Excess: 3 mmol/L — ABNORMAL HIGH (ref 0.0–2.0)
BICARBONATE: 29.7 meq/L — AB (ref 20.0–24.0)
O2 Saturation: 92 %
PO2 ART: 66 mmHg — AB (ref 80.0–100.0)
TCO2: 31 mmol/L (ref 0–100)
pCO2 arterial: 52.4 mmHg — ABNORMAL HIGH (ref 35.0–45.0)
pH, Arterial: 7.362 (ref 7.350–7.450)

## 2014-12-07 LAB — I-STAT TROPONIN, ED: Troponin i, poc: 0 ng/mL (ref 0.00–0.08)

## 2014-12-07 LAB — TSH: TSH: 0.826 u[IU]/mL (ref 0.350–4.500)

## 2014-12-07 LAB — BRAIN NATRIURETIC PEPTIDE: B Natriuretic Peptide: 97.3 pg/mL (ref 0.0–100.0)

## 2014-12-07 LAB — GLUCOSE, CAPILLARY: Glucose-Capillary: 259 mg/dL — ABNORMAL HIGH (ref 65–99)

## 2014-12-07 MED ORDER — METHYLPREDNISOLONE SODIUM SUCC 125 MG IJ SOLR
60.0000 mg | Freq: Three times a day (TID) | INTRAMUSCULAR | Status: DC
  Administered 2014-12-07 – 2014-12-08 (×4): 60 mg via INTRAVENOUS
  Filled 2014-12-07 (×4): qty 2

## 2014-12-07 MED ORDER — PNEUMOCOCCAL VAC POLYVALENT 25 MCG/0.5ML IJ INJ
0.5000 mL | INJECTION | INTRAMUSCULAR | Status: AC
Start: 2014-12-08 — End: 2014-12-08
  Administered 2014-12-08: 0.5 mL via INTRAMUSCULAR
  Filled 2014-12-07: qty 0.5

## 2014-12-07 MED ORDER — INSULIN GLARGINE 100 UNIT/ML ~~LOC~~ SOLN
10.0000 [IU] | Freq: Every day | SUBCUTANEOUS | Status: DC
  Administered 2014-12-07 – 2014-12-10 (×4): 10 [IU] via SUBCUTANEOUS
  Filled 2014-12-07 (×5): qty 0.1

## 2014-12-07 MED ORDER — DOXYCYCLINE HYCLATE 100 MG PO TABS
100.0000 mg | ORAL_TABLET | Freq: Two times a day (BID) | ORAL | Status: DC
  Administered 2014-12-07 – 2014-12-10 (×7): 100 mg via ORAL
  Filled 2014-12-07 (×7): qty 1

## 2014-12-07 MED ORDER — SODIUM CHLORIDE 0.9 % IV SOLN: 250.0000 mL | INTRAVENOUS | Status: DC | PRN

## 2014-12-07 MED ORDER — PRAVASTATIN SODIUM 40 MG PO TABS
40.0000 mg | ORAL_TABLET | Freq: Every day | ORAL | Status: DC
  Administered 2014-12-07 – 2014-12-10 (×4): 40 mg via ORAL
  Filled 2014-12-07 (×4): qty 1

## 2014-12-07 MED ORDER — PANTOPRAZOLE SODIUM 40 MG PO TBEC
40.0000 mg | DELAYED_RELEASE_TABLET | Freq: Every day | ORAL | Status: DC
  Administered 2014-12-07 – 2014-12-10 (×4): 40 mg via ORAL
  Filled 2014-12-07 (×4): qty 1

## 2014-12-07 MED ORDER — INSULIN ASPART 100 UNIT/ML ~~LOC~~ SOLN
0.0000 [IU] | Freq: Three times a day (TID) | SUBCUTANEOUS | Status: DC
  Administered 2014-12-08 (×2): 2 [IU] via SUBCUTANEOUS
  Administered 2014-12-08: 3 [IU] via SUBCUTANEOUS
  Administered 2014-12-09: 1 [IU] via SUBCUTANEOUS
  Administered 2014-12-09: 3 [IU] via SUBCUTANEOUS
  Administered 2014-12-09: 2 [IU] via SUBCUTANEOUS

## 2014-12-07 MED ORDER — IPRATROPIUM-ALBUTEROL 0.5-2.5 (3) MG/3ML IN SOLN
3.0000 mL | Freq: Once | RESPIRATORY_TRACT | Status: AC
  Administered 2014-12-07: 3 mL via RESPIRATORY_TRACT
  Filled 2014-12-07: qty 3

## 2014-12-07 MED ORDER — FUROSEMIDE 10 MG/ML IJ SOLN
40.0000 mg | Freq: Two times a day (BID) | INTRAMUSCULAR | Status: DC
  Administered 2014-12-07 – 2014-12-09 (×4): 40 mg via INTRAVENOUS
  Filled 2014-12-07 (×4): qty 4

## 2014-12-07 MED ORDER — ZOLPIDEM TARTRATE 5 MG PO TABS
5.0000 mg | ORAL_TABLET | Freq: Every evening | ORAL | Status: DC | PRN
Start: 2014-12-07 — End: 2014-12-11
  Administered 2014-12-08 – 2014-12-10 (×3): 5 mg via ORAL
  Filled 2014-12-07 (×3): qty 1

## 2014-12-07 MED ORDER — SODIUM CHLORIDE 0.9 % IJ SOLN: 3.0000 mL | INTRAMUSCULAR | Status: DC | PRN

## 2014-12-07 MED ORDER — NICOTINE 14 MG/24HR TD PT24
14.0000 mg | MEDICATED_PATCH | Freq: Every day | TRANSDERMAL | Status: DC
  Administered 2014-12-07 – 2014-12-10 (×4): 14 mg via TRANSDERMAL
  Filled 2014-12-07 (×4): qty 1

## 2014-12-07 MED ORDER — LEVALBUTEROL HCL 0.63 MG/3ML IN NEBU: 0.6300 mg | INHALATION_SOLUTION | Freq: Four times a day (QID) | RESPIRATORY_TRACT | Status: DC | PRN

## 2014-12-07 MED ORDER — INFLUENZA VAC SPLIT QUAD 0.5 ML IM SUSY
0.5000 mL | PREFILLED_SYRINGE | INTRAMUSCULAR | Status: AC
  Administered 2014-12-08: 0.5 mL via INTRAMUSCULAR
  Filled 2014-12-07: qty 0.5

## 2014-12-07 MED ORDER — PANTOPRAZOLE SODIUM 40 MG PO TBEC: 40.0000 mg | DELAYED_RELEASE_TABLET | Freq: Every day | ORAL | Status: DC

## 2014-12-07 MED ORDER — COLCHICINE 0.6 MG PO TABS
0.6000 mg | ORAL_TABLET | Freq: Two times a day (BID) | ORAL | Status: DC
  Filled 2014-12-07 (×4): qty 1

## 2014-12-07 MED ORDER — CLONAZEPAM 1 MG PO TABS
1.0000 mg | ORAL_TABLET | Freq: Two times a day (BID) | ORAL | Status: DC | PRN
  Administered 2014-12-07 – 2014-12-10 (×4): 1 mg via ORAL
  Filled 2014-12-07 (×4): qty 1

## 2014-12-07 MED ORDER — HYDROCODONE-ACETAMINOPHEN 5-325 MG PO TABS
1.0000 | ORAL_TABLET | Freq: Two times a day (BID) | ORAL | Status: DC | PRN
  Administered 2014-12-08 – 2014-12-10 (×2): 1 via ORAL
  Filled 2014-12-07 (×2): qty 1

## 2014-12-07 MED ORDER — METOPROLOL SUCCINATE ER 50 MG PO TB24
50.0000 mg | ORAL_TABLET | Freq: Every day | ORAL | Status: DC
  Administered 2014-12-07 – 2014-12-10 (×4): 50 mg via ORAL
  Filled 2014-12-07 (×3): qty 1

## 2014-12-07 MED ORDER — FUROSEMIDE 10 MG/ML IJ SOLN
40.0000 mg | Freq: Once | INTRAMUSCULAR | Status: AC
  Administered 2014-12-07: 40 mg via INTRAVENOUS
  Filled 2014-12-07: qty 4

## 2014-12-07 MED ORDER — LISINOPRIL 2.5 MG PO TABS
2.5000 mg | ORAL_TABLET | Freq: Every day | ORAL | Status: DC
  Administered 2014-12-07 – 2014-12-10 (×4): 2.5 mg via ORAL
  Filled 2014-12-07 (×4): qty 1

## 2014-12-07 MED ORDER — MIRTAZAPINE 7.5 MG PO TABS
15.0000 mg | ORAL_TABLET | Freq: Every day | ORAL | Status: DC
  Administered 2014-12-07 – 2014-12-10 (×4): 15 mg via ORAL
  Filled 2014-12-07 (×4): qty 2

## 2014-12-07 MED ORDER — SODIUM CHLORIDE 0.9 % IJ SOLN
3.0000 mL | Freq: Two times a day (BID) | INTRAMUSCULAR | Status: DC
  Administered 2014-12-07 – 2014-12-10 (×7): 3 mL via INTRAVENOUS

## 2014-12-07 MED ORDER — POTASSIUM CHLORIDE CRYS ER 20 MEQ PO TBCR
40.0000 meq | EXTENDED_RELEASE_TABLET | Freq: Every day | ORAL | Status: DC
  Administered 2014-12-07 – 2014-12-10 (×4): 40 meq via ORAL
  Filled 2014-12-07 (×4): qty 2

## 2014-12-07 MED ORDER — ENOXAPARIN SODIUM 80 MG/0.8ML ~~LOC~~ SOLN
70.0000 mg | SUBCUTANEOUS | Status: DC
  Administered 2014-12-07 – 2014-12-09 (×3): 70 mg via SUBCUTANEOUS
  Filled 2014-12-07 (×3): qty 0.8

## 2014-12-07 MED ORDER — ONDANSETRON HCL 4 MG/2ML IJ SOLN: 4.0000 mg | Freq: Four times a day (QID) | INTRAMUSCULAR | Status: DC | PRN

## 2014-12-07 MED ORDER — ACETAMINOPHEN 325 MG PO TABS: 650.0000 mg | ORAL_TABLET | ORAL | Status: DC | PRN

## 2014-12-07 MED ORDER — BUDESONIDE 0.5 MG/2ML IN SUSP
0.5000 mg | Freq: Two times a day (BID) | RESPIRATORY_TRACT | Status: DC
  Administered 2014-12-07 – 2014-12-10 (×6): 0.5 mg via RESPIRATORY_TRACT
  Filled 2014-12-07 (×8): qty 2

## 2014-12-07 NOTE — ED Notes (Signed)
PA at bedside.

## 2014-12-07 NOTE — H&P (Signed)
[facility] History and Physical                                                                                    Michele Owens, is a 54 y.o. female  MRN: [id number]   DOB - 1960-12-27  Admit Date - 12/07/2014  Outpatient Primary MD for the patient is Barbette Merino, MD  Referring Physician:  Carl Best  Chief Complaint:   Chief Complaint  Patient presents with  . Shortness of Breath     HPI  Michele Owens  is a 54 y.o. female, with COPD, ongoing tobacco use, OSA, Anxiety, HLD, HTN who presents to the ED with shortness of breath.  She reports being SOB for the past week with a productive cough bringing up clear fluid sometimes tinged with brown sputum.  She denies palpitations, but reports increase dyspnea with minimal exertion (such as bathing).  She reports she has had a tremendous amount of stress in her family recently.  History collection is challenged by the patient's anxiety and dyspnea.  She is unable to list her medications/doseages or indications so I doubt her ability to be compliant with them.  She complains of left knee pain and states she is receiving injections in her knee.  She continues to smoke 1/2 pack a day.  In the ER labs are not significantly abnormal other than a WBC of 19.1. Chest x-ray shows vascular congestion, interstitial edema and small bilateral pleural effusions. The patient is in mild to moderate distress due to difficulty breathing and anxiety.  Review of Systems   In addition to the HPI above,  No Fever-chills, No Abdominal pain, No Nausea or Vomiting, Bowel movements are regular, No Blood in stool or Urine, No dysuria, No new skin rashes or bruises, No new joints pains-aches,  No new weakness, tingling, numbness in any extremity, No recent weight gain or loss, A full 10 point Review of Systems was done, except as stated above, all other Review of Systems were negative.  Past Medical History  Past Medical History  Diagnosis Date  .  Hypertension   . Pain in joint, pelvic region and thigh     back and hip pain  . HLD (hyperlipidemia)   . COPD (chronic obstructive pulmonary disease)   . Tobacco abuse   . Obesity   . Ovarian cyst   . GERD (gastroesophageal reflux disease)   . 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  Marland Kitchen Hx of echocardiogram     a. Echo 4/13:   EF 55-60%.  . Depression   . Hx of cardiovascular stress test     a. ETT-MV 03/2012:   EF 60%, no ischemia    . OSA (obstructive sleep apnea)     a. sleep study 1/14:  mod OSA with API of 29/hr and O2 sat nadir of 80%  . Asthma   . Sleep apnea     Past Surgical History  Procedure Laterality Date  . Total abdominal hysterectomy  07/14/10  . Foot surgery        Social History Social History  Substance Use Topics  .  Smoking status: Current Every Day Smoker -- 0.50 packs/day for 39 years    Types: Cigarettes  . Smokeless tobacco: Never Used     Comment: pt down to .5 pack/day 05/30/13  . Alcohol Use: Yes     Comment: occasionally  Lives at home alone.  Independent with ADLs  Family History Family History  Problem Relation Age of Onset  . Cancer Father     Prior to Admission medications   Medication Sig Start Date End Date Taking? Authorizing Provider  albuterol (PROVENTIL HFA;VENTOLIN HFA) 108 (90 BASE) MCG/ACT inhaler Inhale 1-2 puffs into the lungs every 6 (six) hours as needed for wheezing. 04/09/12   Billy Fischer, MD  albuterol (PROVENTIL) (2.5 MG/3ML) 0.083% nebulizer solution Take 5 mg by nebulization every 6 (six) hours as needed for wheezing or shortness of breath.    Historical Provider, MD  clonazePAM (KLONOPIN) 1 MG tablet Take 1 mg by mouth daily.    Historical Provider, MD  COLCHICINE PO Take 1 tablet by mouth 2 (two) times daily.     Historical Provider, MD  cyclobenzaprine (FLEXERIL) 10 MG tablet Take 1 tablet (10 mg total) by mouth 2 (two) times daily as needed  for muscle spasms. 04/11/13   Tanna Furry, MD  furosemide (LASIX) 20 MG tablet Take 20 mg by mouth daily.    Historical Provider, MD  HYDROcodone-acetaminophen (NORCO/VICODIN) 5-325 MG per tablet Take 1 tablet by mouth 2 (two) times daily as needed for moderate pain.    Historical Provider, MD  LORATADINE PO Take by mouth.    Historical Provider, MD  metoprolol succinate (TOPROL-XL) 50 MG 24 hr tablet Take 1 tablet (50 mg total) by mouth daily. 03/19/12   Liliane Shi, PA-C  naproxen (NAPROSYN) 500 MG tablet Take 500 mg by mouth 2 (two) times daily with a meal.    Historical Provider, MD  omeprazole (PRILOSEC) 40 MG capsule Take 40 mg by mouth daily.    Historical Provider, MD  pravastatin (PRAVACHOL) 20 MG tablet Take 1 tablet (20 mg total) by mouth daily. Patient taking differently: Take 40 mg by mouth daily.  07/22/12   [name], MD  predniSONE (DELTASONE) 20 MG tablet Take 2 tablets (40 mg total) by mouth daily. 02/25/14   Abigail Harris, PA-C  Tavaborole (KERYDIN) 5 % SOLN Apply 1 drop topically daily. 06/02/14   Max T Hyatt, DPM  traMADol (ULTRAM) 50 MG tablet Take 1 tablet (50 mg total) by mouth every 6 (six) hours as needed. 04/11/13   Tanna Furry, MD  zolpidem (AMBIEN) 10 MG tablet Take 10 mg by mouth at bedtime as needed for sleep.    Historical Provider, MD    Allergies  Allergen Reactions  . Lamisil [Terbinafine Hcl]     Pt states causes her to feel funny and rash.  . Ibuprofen Rash    Severe rash    Physical Exam  Vitals  Blood pressure 158/87, pulse 92, temperature 98.5 F (36.9 C), temperature source Oral, resp. rate 22, height 5\' 5"  (1.651 m), weight 136.442 kg (300 lb 12.8 oz), SpO2 94 %.   General: Anxious, talkative female lying in bed, SOB.  Psych:  Anxious but Not Suicidal or Homicidal, Awake Alert, Oriented X 3.  Neuro:   No F.N deficits, ALL C.Nerves Intact, Strength 5/5 all 4 extremities, Sensation intact all 4 extremities.  ENT:  Ears and Eyes appear  Normal, Conjunctivae clear, PER. Moist oral mucosa without erythema or exudates.  Neck:  Supple, No lymphadenopathy appreciated  Respiratory:  Symmetrical chest wall movement, moderately increased work of breathing, bilateral wheezes and crackles.  Cardiac:  RRR, No Murmurs, + 1-2 LE edema noted,+ JVD.    Abdomen:  Positive bowel sounds, Soft, Non tender, Non distended,  No masses appreciated  Skin:  No Cyanosis, Normal Skin Turgor, No Skin Rash or Bruise.  Extremities:  Able to move all 4. 5/5 strength in each, effusion over left knee.  Data Review  CBC  Recent Labs Lab 12/07/14 1327  WBC 19.1*  HGB 11.7*  HCT 40.1  PLT 323  MCV 89.3  MCH 26.1  MCHC 29.2*  RDW 15.4    Chemistries   Recent Labs Lab 12/07/14 1327  NA 138  K 4.0  CL 101  CO2 27  GLUCOSE 248*  BUN 8  CREATININE 0.65  CALCIUM 9.0     Urinalysis    Component Value Date/Time   COLORURINE YELLOW 02/28/2012 2004   APPEARANCEUR CLOUDY* 02/28/2012 2004   LABSPEC 1.023 02/28/2012 2004   PHURINE 6.0 02/28/2012 2004   GLUCOSEU NEGATIVE 02/28/2012 2004   HGBUR TRACE* 02/28/2012 2004   BILIRUBINUR NEGATIVE 02/28/2012 2004   KETONESUR NEGATIVE 02/28/2012 2004   PROTEINUR NEGATIVE 02/28/2012 2004   UROBILINOGEN 0.2 02/28/2012 2004   NITRITE NEGATIVE 02/28/2012 2004   LEUKOCYTESUR NEGATIVE 02/28/2012 2004    Imaging results:   Dg Chest 2 View  12/07/2014   CLINICAL DATA:  Cough and shortness of breath for 2 days.  EXAM: CHEST  2 VIEW  COMPARISON:  02/25/2014  FINDINGS: The heart is mildly enlarged. There is tortuosity and mild ectasia of the thoracic aorta. There is vascular congestion, interstitial edema and small bilateral pleural effusions along with overlying atelectasis and scarring. The bony thorax is intact.  IMPRESSION: CHF with small effusions and areas of atelectasis and scarring.   Electronically Signed   By: Marijo Sanes M.D.   On: 12/07/2014 12:27    My personal review of EKG: NSR, No  ST changes noted.   Assessment & Plan  Principal Problem:   Acute respiratory failure with hypoxia Active Problems:   Heart failure   Obesity   Smoking   OSA (obstructive sleep apnea)   Anxiety   Hyperglycemia   Left knee pain   Acute respiratory failure with hypoxia Multifactorial: Likely new diastolic heart failure in the setting of COPD, OSA and continued tobacco abuse Providing supportive oxygen therapy via nasal cannula when necessary Check ambulating oxygen saturations daily  Probable new onset heart failure Patient with lower extremity edema, JVD, dyspnea on exertion, pleural effusions Will diurese with IV Lasix. Check 2-D echo. Strict I's and O's.  low salt diet. Daily weights  Cycle troponins, check TSH. Last echo was in 2013 and showed a preserved EF  COPD Encouraged smoking cessation. Nicotine patch ordered Treated with steroids and nebulizers in the emergency department Will add doxycycline, pulmicort, flutter valve.  Will continue nebulizers and IV steroids.  Leukocytosis Likely secondary to both ongoing prednisone use and acute COPD exacerbation  Anxiety Patient reports significant stress over the death of her father and family tensions Continue clonazepam. Change dosing to 1 mg bid PRN rather than 2 mg once daily  Obstructive sleep apnea Does not use CPAP at home.  Obesity Discussed weight loss. Will ask nutrition to see her as well.  Hyperglycemia Not on medications at home. Glucose likely worsened by steroids. We'll check a hemoglobin A1c and start on low-dose Lantus with sliding scale  NovoLog  Left knee pain Patient has been working with an outpatient orthopedic and receiving steroid injections. She is reportedly able to ambulate. Will ask for PT evaluation. Pending her progress may consider orthopedic consultation for drainage of effusion.   Consultants Called:  None  Family Communication:   Patient alert and orientated and understands her  plan of care.  Code Status:  full  Condition:  Guarded.  Potential Disposition:  To home in 3-4 days  Time spent in minutes : [id number]  PA-C on 12/07/2014 at 5:53 PM Between 7am to 7pm - Pager - [phone] After 7pm go to [email or web address] - password [email or web address] And look for the night coverage person covering me after hours  [facility]

## 2014-12-07 NOTE — ED Notes (Signed)
Attempted report x1. 

## 2014-12-07 NOTE — ED Notes (Signed)
Pt reports SOB and chest soreness since yesterday. Pt states that she was in a hot shower when she began having the SOB. Pt reports tenderness to palpation in chest. Reports decrease in pain and SOB with ned treatment.  Pt received 10 of albuterol and 1mg  of atrovent. Also received 125mg  of solumedrol. Pt had expiratory wheezing initially which has decreased per EMS.

## 2014-12-07 NOTE — ED Notes (Signed)
Admitting at bedside.  Pt up to restroom without oxygen.  When back to monitor O2 saturations 87% and pt very SOB.  Pt informed of need to use O2 tank when up to RR in the future.  Pt hesitant, states she does not want to wear O2.

## 2014-12-07 NOTE — ED Provider Notes (Signed)
CSN: [id number]     Arrival date & time 12/07/14  13 History   First MD Initiated Contact with Patient 12/07/14 1059     Chief Complaint  Patient presents with  . Shortness of Breath     (Consider location/radiation/quality/duration/timing/severity/associated sxs/prior Treatment) HPI Comments: Pt reports SOB and chest soreness since yesterday. Pt states that she was in a hot shower when she began having the SOB. Pt reports tenderness to palpation in chest. Reports decrease in pain and SOB with ned treatment. Pt received 10 of albuterol and 1mg  of atrovent. Also received 125mg  of solumedrol. No history of hospitalizations for asthma exacerbations. No home O2 use.   Patient is a 54 y.o. female presenting with shortness of breath and wheezing. The history is provided by the patient.  Shortness of Breath Associated symptoms: cough and wheezing   Associated symptoms: no fever   Wheezing Severity:  Severe Severity compared to prior episodes:  Similar Onset quality:  Sudden Duration:  2 days Timing:  Constant Progression:  Worsening Chronicity:  Recurrent Relieved by:  Nothing Worsened by:  Nothing tried Ineffective treatments:  Beta-agonist inhaler, nebulizer treatments and home nebulizer Associated symptoms: chest tightness, cough and shortness of breath   Associated symptoms: no fatigue and no fever   Risk factors: no prior ICU admissions and no prior intubations     Past Medical History  Diagnosis Date  . Hypertension   . Pain in joint, pelvic region and thigh     back and hip pain  . HLD (hyperlipidemia)   . COPD (chronic obstructive pulmonary disease)   . Tobacco abuse   . Obesity   . Ovarian cyst   . GERD (gastroesophageal reflux disease)   . 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  Marland Kitchen Hx of echocardiogram     a. Echo 4/13:   EF 55-60%.  . Depression   . Hx of cardiovascular stress test     a.  ETT-MV 03/2012:   EF 60%, no ischemia    . OSA (obstructive sleep apnea)     a. sleep study 1/14:  mod OSA with API of 29/hr and O2 sat nadir of 80%  . Asthma   . Sleep apnea    Past Surgical History  Procedure Laterality Date  . Total abdominal hysterectomy  07/14/10  . Foot surgery     Family History  Problem Relation Age of Onset  . Cancer Father    Social History  Substance Use Topics  . Smoking status: Current Every Day Smoker -- 0.50 packs/day for 39 years    Types: Cigarettes  . Smokeless tobacco: Never Used     Comment: pt down to .5 pack/day 05/30/13  . Alcohol Use: Yes     Comment: occasionally   OB History    No data available     Review of Systems  Constitutional: Negative for fever and fatigue.  Respiratory: Positive for cough, chest tightness, shortness of breath and wheezing.   All other systems reviewed and are negative.     Allergies  Lamisil and Ibuprofen  Home Medications   Prior to Admission medications   Medication Sig Start Date End Date Taking? Authorizing Provider  albuterol (PROVENTIL HFA;VENTOLIN HFA) 108 (90 BASE) MCG/ACT inhaler Inhale 1-2 puffs into the lungs every 6 (six) hours as needed for wheezing. 04/09/12   Billy Fischer, MD  albuterol (PROVENTIL) (2.5 MG/3ML) 0.083% nebulizer solution Take 5  mg by nebulization every 6 (six) hours as needed for wheezing or shortness of breath.    Historical Provider, MD  clonazePAM (KLONOPIN) 1 MG tablet Take 1 mg by mouth daily.    Historical Provider, MD  COLCHICINE PO Take 1 tablet by mouth 2 (two) times daily.     Historical Provider, MD  cyclobenzaprine (FLEXERIL) 10 MG tablet Take 1 tablet (10 mg total) by mouth 2 (two) times daily as needed for muscle spasms. 04/11/13   Tanna Furry, MD  furosemide (LASIX) 20 MG tablet Take 20 mg by mouth daily.    Historical Provider, MD  HYDROcodone-acetaminophen (NORCO/VICODIN) 5-325 MG per tablet Take 1 tablet by mouth 2 (two) times daily as needed for moderate  pain.    Historical Provider, MD  LORATADINE PO Take by mouth.    Historical Provider, MD  metoprolol succinate (TOPROL-XL) 50 MG 24 hr tablet Take 1 tablet (50 mg total) by mouth daily. 03/19/12   Liliane Shi, PA-C  naproxen (NAPROSYN) 500 MG tablet Take 500 mg by mouth 2 (two) times daily with a meal.    Historical Provider, MD  omeprazole (PRILOSEC) 40 MG capsule Take 40 mg by mouth daily.    Historical Provider, MD  pravastatin (PRAVACHOL) 20 MG tablet Take 1 tablet (20 mg total) by mouth daily. Patient taking differently: Take 40 mg by mouth daily.  07/22/12   [name], MD  predniSONE (DELTASONE) 20 MG tablet Take 2 tablets (40 mg total) by mouth daily. 02/25/14   Abigail Harris, PA-C  Tavaborole (KERYDIN) 5 % SOLN Apply 1 drop topically daily. 06/02/14   Max T Hyatt, DPM  traMADol (ULTRAM) 50 MG tablet Take 1 tablet (50 mg total) by mouth every 6 (six) hours as needed. 04/11/13   Tanna Furry, MD  zolpidem (AMBIEN) 10 MG tablet Take 10 mg by mouth at bedtime as needed for sleep.    Historical Provider, MD   BP 133/63 mmHg  Pulse 106  Temp(Src) 98.8 F (37.1 C) (Oral)  Resp 21  Ht 5\' 5"  (1.651 m)  SpO2 93% Physical Exam  Constitutional: She is oriented to person, place, and time. She appears well-developed and well-nourished. No distress.  HENT:  Head: Normocephalic and atraumatic.  Right Ear: External ear normal.  Left Ear: External ear normal.  Mouth/Throat: Oropharynx is clear and moist.  Eyes: Conjunctivae are normal.  Neck: Neck supple.  Cardiovascular: Normal rate, regular rhythm and normal heart sounds.   Pulmonary/Chest: Accessory muscle usage (mild) present. She has wheezes (inspiratory and expiratory upper lung fields). She exhibits tenderness.  Diminished breath sounds at bases  Abdominal: Soft. There is no tenderness.  Musculoskeletal: Normal range of motion. She exhibits no edema.  Neurological: She is alert and oriented to person, place, and time.  Skin: Skin  is warm and dry. She is not diaphoretic.  Nursing note and vitals reviewed.   ED Course  Procedures (including critical care time) Medications  furosemide (LASIX) injection 40 mg (not administered)  ipratropium-albuterol (DUONEB) 0.5-2.5 (3) MG/3ML nebulizer solution 3 mL (3 mLs Nebulization Given 12/07/14 1252)  ipratropium-albuterol (DUONEB) 0.5-2.5 (3) MG/3ML nebulizer solution 3 mL (3 mLs Nebulization Given 12/07/14 1430)    Labs Review Labs Reviewed  CBC - Abnormal; Notable for the following:    WBC 19.1 (*)    Hemoglobin 11.7 (*)    MCHC 29.2 (*)    All other components within normal limits  BASIC METABOLIC PANEL - Abnormal; Notable for the following:  Glucose, Bld 248 (*)    All other components within normal limits  I-STAT ARTERIAL BLOOD GAS, ED - Abnormal; Notable for the following:    pCO2 arterial 52.4 (*)    pO2, Arterial 66.0 (*)    Bicarbonate 29.7 (*)    Acid-Base Excess 3.0 (*)    All other components within normal limits  BRAIN NATRIURETIC PEPTIDE  I-STAT TROPOININ, ED    Imaging Review Dg Chest 2 View  12/07/2014   CLINICAL DATA:  Cough and shortness of breath for 2 days.  EXAM: CHEST  2 VIEW  COMPARISON:  02/25/2014  FINDINGS: The heart is mildly enlarged. There is tortuosity and mild ectasia of the thoracic aorta. There is vascular congestion, interstitial edema and small bilateral pleural effusions along with overlying atelectasis and scarring. The bony thorax is intact.  IMPRESSION: CHF with small effusions and areas of atelectasis and scarring.   Electronically Signed   By: Marijo Sanes M.D.   On: 12/07/2014 12:27   I have personally reviewed and evaluated these images and lab results as part of my medical decision-making.   EKG Interpretation   Date/Time:  Monday December 07 2014 11:00:28 EDT Ventricular Rate:  76 PR Interval:  155 QRS Duration: 74 QT Interval:  411 QTC Calculation: 462 R Axis:   64 Text Interpretation:  Sinus arrhythmia since  last tracing no significant  change Confirmed by WENTZ  MD, ELLIOTT ([id number]) on 12/07/2014 11:37:23 AM      3:04 PM Patient ambulated with increased dyspnea and accessory muscle use. Oxygen saturations dropped to 85% on RA.   MDM   Final diagnoses:  Hypoxia  Asthma exacerbation  Pleural effusion    Filed Vitals:   12/07/14 1500  BP: 133/63  Pulse: 106  Temp:   Resp: 21     I have reviewed nursing notes, vital signs, and all lab and imaging results as noted above.  Patient presenting from home for evaluation of wheezing and shortness of breath. On arrival, accessory muscle use and 20 Wheezes appreciated in upper lung fields with diminished breath sounds at bases bilaterally. Patient with new O2 requirement of 2 L, hypoxic on room air. After 2 more DuoNeb's given in emergency department wheezing resolved, lung fields improving at bases. Chest x-ray reveals cardiomegaly with small pleural effusions. Labs reviewed. Patient ambulated in the hallway and pulse oximetry O2 dropped to 85% on room he with heart rate racing to 125. Patient endorses worsening dyspnea on exertion. We'll dose with IV Lasix commitment for further evaluation and management of new hypoxia. Patient d/w with Dr. Eulis Foster, agrees with plan.    Baron Sane, PA-C 12/07/14 [place], MD 12/07/14 [phone]

## 2014-12-07 NOTE — ED Notes (Signed)
Pt ambulated in hallway with 1 assist. Pt spO2 dropped to 85% on RA. HR 125. Pt reports SOB worse with ambulating.

## 2014-12-07 NOTE — ED Notes (Signed)
[place], son - [phone], please call with updates

## 2014-12-08 ENCOUNTER — Inpatient Hospital Stay (HOSPITAL_COMMUNITY): Payer: Medicaid Other

## 2014-12-08 DIAGNOSIS — R0600 Dyspnea, unspecified: Secondary | ICD-10-CM

## 2014-12-08 LAB — BASIC METABOLIC PANEL
ANION GAP: 8 (ref 5–15)
BUN: 9 mg/dL (ref 6–20)
CHLORIDE: 96 mmol/L — AB (ref 101–111)
CO2: 33 mmol/L — AB (ref 22–32)
Calcium: 9.1 mg/dL (ref 8.9–10.3)
Creatinine, Ser: 0.54 mg/dL (ref 0.44–1.00)
GFR calc non Af Amer: >60 mL/min (ref >60)
GLUCOSE: 197 mg/dL — AB (ref 65–99)
Potassium: 3.9 mmol/L (ref 3.5–5.1)
Sodium: 137 mmol/L (ref 135–145)

## 2014-12-08 LAB — GLUCOSE, CAPILLARY
GLUCOSE-CAPILLARY: 189 mg/dL — AB (ref 65–99)
GLUCOSE-CAPILLARY: 205 mg/dL — AB (ref 65–99)
GLUCOSE-CAPILLARY: 167 mg/dL — AB (ref 65–99)
Glucose-Capillary: 183 mg/dL — ABNORMAL HIGH (ref 65–99)

## 2014-12-08 LAB — TROPONIN I: Troponin I: <0.03 ng/mL (ref <0.031)

## 2014-12-08 LAB — HEMOGLOBIN A1C
Hgb A1c MFr Bld: 6.5 % — ABNORMAL HIGH (ref 4.8–5.6)
MEAN PLASMA GLUCOSE: 140 mg/dL

## 2014-12-08 MED ORDER — TRAMADOL HCL 50 MG PO TABS
50.0000 mg | ORAL_TABLET | Freq: Four times a day (QID) | ORAL | Status: DC | PRN
  Administered 2014-12-08 – 2014-12-09 (×2): 50 mg via ORAL
  Filled 2014-12-08 (×3): qty 1

## 2014-12-08 MED ORDER — METHYLPREDNISOLONE SODIUM SUCC 125 MG IJ SOLR
60.0000 mg | Freq: Two times a day (BID) | INTRAMUSCULAR | Status: DC
  Administered 2014-12-08 – 2014-12-09 (×2): 60 mg via INTRAVENOUS
  Filled 2014-12-08 (×2): qty 2

## 2014-12-08 MED ORDER — IPRATROPIUM-ALBUTEROL 0.5-2.5 (3) MG/3ML IN SOLN
3.0000 mL | Freq: Four times a day (QID) | RESPIRATORY_TRACT | Status: DC
  Administered 2014-12-08 – 2014-12-09 (×3): 3 mL via RESPIRATORY_TRACT
  Filled 2014-12-08 (×4): qty 3

## 2014-12-08 NOTE — Care Management Note (Signed)
Case Management Note  Patient Details  Name: Annel Zunker MRN: [id number] Date of Birth: [birth year]  Subjective/Objective:   Pt presented to ED secondary to worsening SOB and wheezing.                  Action/Plan: Pt continues on IV lasix and solumedrol IV. CM will continue to monitor for disposition needs.    Expected Discharge Date:                  Expected Discharge Plan:  Home/Self Care  In-House Referral:  NA  Discharge planning Services  CM Consult  Post Acute Care Choice:    Choice offered to:     DME Arranged:    DME Agency:     HH Arranged:    HH Agency:     Status of Service:  In process, will continue to follow  Medicare Important Message Given:    Date Medicare IM Given:    Medicare IM give by:    Date Additional Medicare IM Given:    Additional Medicare Important Message give by:     If discussed at [place] of Stay Meetings, dates discussed:    Additional Comments:  Bethena Roys, RN 12/08/2014, 11:36 AM

## 2014-12-08 NOTE — Progress Notes (Signed)
[facility] PROGRESS NOTE  Michele Owens SAY:[id number] DOB: 1960-05-19 DOA: 12/07/2014 PCP: Barbette Merino, MD  Brief Summary  Michele Owens is a 54 y.o. female, with COPD, ongoing tobacco use, OSA, Anxiety, HLD, HTN who presented to the ED with shortness of breath. She reported SOB with a productive cough bringing up clear fluid sometimes tinged with brown sputum for one week. Increased dyspnea with minimal exertion (such as bathing).  She was unable to list her medications/doseages or indications so there was some question about her compliance. She complained of left knee pain and stated she receives injections in her knee. She smokes 1/2 pack a day.  In ER, WBC of 19.1. Chest x-ray showed vascular congestion, interstitial edema and small bilateral pleural effusions. The patient was in mild to moderate distress due to difficulty breathing and anxiety.  Assessment/Plan  Acute respiratory failure probably due to a combination of acute diastolic heart failure and acute COPD.  Her lower extremity edema and congestion on CXR suggest acute heart failure, however, her productive sputum in the setting of ongoing smoking and asthma suggest an acute COPD component.  She was started on treatment for both. -  ECHO:  preserved EF 55-60% with grade 2 DD and high filling pressures, moderately dilated IVC -  I/O not strictly recorded -  Neg 1.1 L so far today -  Continue diuresis with lasix 40mg  IV BID -  Decrease to BID solumedrol -  Start duonebs -  Continue prn xopenex and brovana -  Continue doxycycline -  Wean O2 as tolerated  Chest pain probably due to the above.  Should improve with diuresis -  ECHO without regional wall motion abl -  troponins negative -  Tele:  NSR with frequent ectopy/PVC -  Agree with metoprolol  Leukocytosis Likely secondary to both ongoing prednisone use and acute COPD exacerbation  Anxiety Patient reports significant stress over the death of her father and  family tensions Continue clonazepam. Change dosing to 1 mg bid PRN rather than 2 mg once daily  Obstructive sleep apnea Does not use CPAP at home.  Obesity Discussed weight loss. Will ask nutrition to see her as well.  New diagnosis of diabetes mellitus, A1c 6.5, may be elevated by steroid injections -  Nutrition and diabetic educator consultation -  Continue SSI  Left knee pain Patient has been working with an outpatient orthopedic and receiving steroid injections. She is reportedly able to ambulate PT evaluation pending Pending her progress may consider orthopedic consultation for drainage of effusion.  Diet:  Diabetic diet/healthy heart Access:  PIV IVF:  off Proph:  lovenox  Code Status: full Family Communication: patient alone Disposition Plan: pending improvement in breathing, still on 2L [place] with wheezing   Consultants:  None  Procedures:  ECHO  Antibiotics:  none   HPI/Subjective:  States she is still SOB but feeling somewhat better.  Has soreness in the middle of her chest and tightness.      Objective: Filed Vitals:   12/08/14 0510 12/08/14 0557 12/08/14 1026 12/08/14 1027  BP:  125/56 132/80   Pulse:  90  92  Temp:  97.6 F (36.4 C)    TempSrc:  Oral    Resp:  20    Height:      Weight: 128.822 kg (284 lb)     SpO2:  91%      Intake/Output Summary (Last 24 hours) at 12/08/14 1359 Last data filed at 12/08/14 1158  Gross per 24 hour  Intake  924 ml  Output   1175 ml  Net   -251 ml   Filed Weights   12/07/14 1659 12/08/14 0510  Weight: 136.442 kg (300 lb 12.8 oz) 128.822 kg (284 lb)   Body mass index is 47.26 kg/(m^2).  Exam:   General:  Obese female, No acute distress, nasal canula in place  HEENT:  NCAT, MMM  Cardiovascular:  RRR, nl S1, S2 no mrg, 2+ pulses, warm extremities  Respiratory:  Diminished bilateral breath sounds with prolonged exhalation and high pitched full expiratory wheeze, no rales or rhonchi  Abdomen:    NABS, soft, NT/ND  MSK:   Normal tone and bulk, 1+ bilateral pitting LEE  Neuro:  Grossly intact  Data Reviewed: Basic Metabolic Panel:  Recent Labs Lab 12/07/14 1327 12/08/14 0506  NA 138 137  K 4.0 3.9  CL 101 96*  CO2 27 33*  GLUCOSE 248* 197*  BUN 8 9  CREATININE 0.65 0.54  CALCIUM 9.0 9.1   Liver Function Tests: No results for input(s): AST, ALT, ALKPHOS, BILITOT, PROT, ALBUMIN in the last 168 hours. No results for input(s): LIPASE, AMYLASE in the last 168 hours. No results for input(s): AMMONIA in the last 168 hours. CBC:  Recent Labs Lab 12/07/14 1327  WBC 19.1*  HGB 11.7*  HCT 40.1  MCV 89.3  PLT 323    No results found for this or any previous visit (from the past 240 hour(s)).   Studies: Dg Chest 2 View  12/07/2014   CLINICAL DATA:  Cough and shortness of breath for 2 days.  EXAM: CHEST  2 VIEW  COMPARISON:  02/25/2014  FINDINGS: The heart is mildly enlarged. There is tortuosity and mild ectasia of the thoracic aorta. There is vascular congestion, interstitial edema and small bilateral pleural effusions along with overlying atelectasis and scarring. The bony thorax is intact.  IMPRESSION: CHF with small effusions and areas of atelectasis and scarring.   Electronically Signed   By: Marijo Sanes M.D.   On: 12/07/2014 12:27    Scheduled Meds: . budesonide (PULMICORT) nebulizer solution  0.5 mg Nebulization BID  . colchicine  0.6 mg Oral BID  . doxycycline  100 mg Oral Q12H  . enoxaparin (LOVENOX) injection  70 mg Subcutaneous Q24H  . furosemide  40 mg Intravenous BID  . insulin aspart  0-9 Units Subcutaneous TID WC  . insulin glargine  10 Units Subcutaneous QHS  . lisinopril  2.5 mg Oral Daily  . methylPREDNISolone (SOLU-MEDROL) injection  60 mg Intravenous 3 times per day  . metoprolol succinate  50 mg Oral Daily  . mirtazapine  15 mg Oral QHS  . nicotine  14 mg Transdermal Daily  . pantoprazole  40 mg Oral Daily  . potassium chloride  40 mEq Oral  Daily  . pravastatin  40 mg Oral q1800  . sodium chloride  3 mL Intravenous Q12H   Continuous Infusions:   Principal Problem:   Acute respiratory failure with hypoxia Active Problems:   Obesity   Smoking   OSA (obstructive sleep apnea)   Anxiety   Heart failure   Hyperglycemia   Left knee pain   COPD exacerbation   Acute on chronic diastolic HF (heart failure)    Time spent: 30 min    Michele Owens, [facility] Pager [phone]. If 7PM-7AM, please contact night-coverage at [email or web address], password [facility] 12/08/2014, 1:59 PM  LOS: 1 day

## 2014-12-08 NOTE — Progress Notes (Signed)
Inpatient Diabetes Program Recommendations  AACE/ADA: New Consensus Statement on Inpatient Glycemic Control (2013)  Target Ranges:  Prepandial:   less than 140 mg/dL      Peak postprandial:   less than 180 mg/dL (1-2 hours)      Critically ill patients:  140 - 180 mg/dL    Results for Michele Owens, Michele Owens (MRN [id number]) as of 12/08/2014 12:34  Ref. Range 12/07/2014 20:55 12/08/2014 07:31 12/08/2014 11:46  Glucose-Capillary Latest Ref Range: 65-99 mg/dL 259 (H) 189 (H) 183 (H)    Results for ANACAROLINA, [name] (MRN [id number]) as of 12/08/2014 12:34  Ref. Range 12/07/2014 18:30  Hemoglobin A1C Latest Ref Range: 4.8-5.6 % 6.5 (H)     Admit with: SOB  History: COPD, HTN  Current Orders: Lantus 10 units QHS      Novolog Sensitive SSI (0-9 units) TID AC     -Patient currently getting IV Solumedrol 60 mg Q8 hours.  -A1c 6.5%- Patient obviously has been having issues with her glucose at home.  Has taken steroids at home in the past.  -Spoke to patient about her current A1c of 6.5%.  Explained to patient why we are giving her insulin here in the hospital (steroids, elevated glucose levels).  Patient stated to me "I ain't got no Diabetes so I don't need to worry about that.  My mama has the diabetes but she don't have to take the shots or the pills.  I don't have to worry about that!".   Will follow Wyn Quaker RN, MSN, CDE Diabetes Coordinator Inpatient Glycemic Control Team Team Pager: [phone] (8a-5p)

## 2014-12-08 NOTE — Progress Notes (Signed)
  Echocardiogram 2D Echocardiogram has been performed.  Basilia Jumbo 12/08/2014, 9:22 AM

## 2014-12-09 DIAGNOSIS — I5033 Acute on chronic diastolic (congestive) heart failure: Principal | ICD-10-CM

## 2014-12-09 LAB — BASIC METABOLIC PANEL
ANION GAP: 8 (ref 5–15)
BUN: 12 mg/dL (ref 6–20)
CHLORIDE: 96 mmol/L — AB (ref 101–111)
CO2: 35 mmol/L — AB (ref 22–32)
Calcium: 9.3 mg/dL (ref 8.9–10.3)
Creatinine, Ser: 0.57 mg/dL (ref 0.44–1.00)
GFR calc non Af Amer: >60 mL/min (ref >60)
GLUCOSE: 144 mg/dL — AB (ref 65–99)
Potassium: 4.1 mmol/L (ref 3.5–5.1)
Sodium: 139 mmol/L (ref 135–145)

## 2014-12-09 LAB — CBC
HEMATOCRIT: 41.8 % (ref 36.0–46.0)
HEMOGLOBIN: 12.4 g/dL (ref 12.0–15.0)
MCH: 26.3 pg (ref 26.0–34.0)
MCHC: 29.7 g/dL — ABNORMAL LOW (ref 30.0–36.0)
MCV: 88.6 fL (ref 78.0–100.0)
Platelets: 440 10*3/uL — ABNORMAL HIGH (ref 150–400)
RBC: 4.72 MIL/uL (ref 3.87–5.11)
RDW: 15.6 % — ABNORMAL HIGH (ref 11.5–15.5)
WBC: 38.7 10*3/uL — AB (ref 4.0–10.5)

## 2014-12-09 LAB — GLUCOSE, CAPILLARY
GLUCOSE-CAPILLARY: 142 mg/dL — AB (ref 65–99)
GLUCOSE-CAPILLARY: 226 mg/dL — AB (ref 65–99)
Glucose-Capillary: 155 mg/dL — ABNORMAL HIGH (ref 65–99)
Glucose-Capillary: 200 mg/dL — ABNORMAL HIGH (ref 65–99)

## 2014-12-09 MED ORDER — FUROSEMIDE 10 MG/ML IJ SOLN
80.0000 mg | Freq: Two times a day (BID) | INTRAMUSCULAR | Status: DC
  Administered 2014-12-09 – 2014-12-10 (×2): 80 mg via INTRAVENOUS
  Filled 2014-12-09 (×2): qty 8

## 2014-12-09 MED ORDER — PREDNISONE 20 MG PO TABS
40.0000 mg | ORAL_TABLET | Freq: Two times a day (BID) | ORAL | Status: DC
  Administered 2014-12-09 – 2014-12-10 (×2): 40 mg via ORAL
  Filled 2014-12-09 (×3): qty 2

## 2014-12-09 NOTE — Progress Notes (Signed)
[facility] PROGRESS NOTE  Altagracia Rone VOJ:[id number] DOB: 1960/11/22 DOA: 12/07/2014 PCP: Barbette Merino, MD  Brief Summary  Michele Owens is a 54 y.o. female, with COPD, ongoing tobacco use, OSA, Anxiety, HLD, HTN who presented to the ED with shortness of breath. She reported SOB with a productive cough bringing up clear fluid sometimes tinged with brown sputum for one week. Increased dyspnea with minimal exertion (such as bathing).  She was unable to list her medications/doseages or indications so there was some question about her compliance. She complained of left knee pain and stated she receives injections in her knee. She smokes 1/2 pack a day.  In ER, WBC of 19.1. Chest x-ray showed vascular congestion, interstitial edema and small bilateral pleural effusions. The patient was in mild to moderate distress due to difficulty breathing and anxiety.  Assessment/Plan  Acute respiratory failure probably due to a combination of acute diastolic heart failure and acute COPD.  Increase lasix to 80 bid. Change pred to po. Wean oxygen  Chest pain probably due to the above.   -  ECHO without regional wall motion abl -  troponins negative  Leukocytosis Likely secondary to both ongoing prednisone use and acute COPD exacerbation  Anxiety Continue prn klonipin  Obstructive sleep apnea Does not use CPAP at home.  Obesity Discussed weight loss. Will ask nutrition to see her as well.  New diagnosis of diabetes mellitus, A1c 6.5, may be elevated by steroid injections -  Nutrition and diabetic educator consultation -  Continue SSI  Code Status: full Family Communication: patient alone Disposition Plan:    Consultants:  None  Procedures:  ECHO  Antibiotics:  none   HPI/Subjective: Overall, better, but still SOB, orthopnea   Objective: Filed Vitals:   12/09/14 0358 12/09/14 0500 12/09/14 0748 12/09/14 0956  BP:  144/80  145/67  Pulse:  82  71  Temp:  98.2 F (36.8  C)    TempSrc:  Oral    Resp:      Height:      Weight: 134.9 kg (297 lb 6.4 oz)     SpO2:  95% 95%     Intake/Output Summary (Last 24 hours) at 12/09/14 1437 Last data filed at 12/09/14 0909  Gross per 24 hour  Intake   1344 ml  Output   2100 ml  Net   -756 ml   Filed Weights   12/07/14 1659 12/08/14 0510 12/09/14 0358  Weight: 136.442 kg (300 lb 12.8 oz) 128.822 kg (284 lb) 134.9 kg (297 lb 6.4 oz)   Body mass index is 49.49 kg/(m^2).  Exam:   General:  Obese female, No acute distress, nasal canula in place  Cardiovascular:  RRR, nl S1, S2 no mrg, 2+ pulses, warm extremities  Respiratory:  Diminished without WRR  Abdomen:   NABS, soft, NT/ND  MSK:   Normal tone and bulk, 1+ bilateral pitting LEE  Data Reviewed: Basic Metabolic Panel:  Recent Labs Lab 12/07/14 1327 12/08/14 0506 12/09/14 0403  NA 138 137 139  K 4.0 3.9 4.1  CL 101 96* 96*  CO2 27 33* 35*  GLUCOSE 248* 197* 144*  BUN 8 9 12   CREATININE 0.65 0.54 0.57  CALCIUM 9.0 9.1 9.3   Liver Function Tests: No results for input(s): AST, ALT, ALKPHOS, BILITOT, PROT, ALBUMIN in the last 168 hours. No results for input(s): LIPASE, AMYLASE in the last 168 hours. No results for input(s): AMMONIA in the last 168 hours. CBC:  Recent Labs Lab 12/07/14 1327  12/09/14 0403  WBC 19.1* 38.7*  HGB 11.7* 12.4  HCT 40.1 41.8  MCV 89.3 88.6  PLT 323 440*    No results found for this or any previous visit (from the past 240 hour(s)).   Studies: No results found.  Scheduled Meds: . budesonide (PULMICORT) nebulizer solution  0.5 mg Nebulization BID  . colchicine  0.6 mg Oral BID  . doxycycline  100 mg Oral Q12H  . enoxaparin (LOVENOX) injection  70 mg Subcutaneous Q24H  . furosemide  40 mg Intravenous BID  . insulin aspart  0-9 Units Subcutaneous TID WC  . insulin glargine  10 Units Subcutaneous QHS  . lisinopril  2.5 mg Oral Daily  . methylPREDNISolone (SOLU-MEDROL) injection  60 mg Intravenous BID   . metoprolol succinate  50 mg Oral Daily  . mirtazapine  15 mg Oral QHS  . nicotine  14 mg Transdermal Daily  . pantoprazole  40 mg Oral Daily  . potassium chloride  40 mEq Oral Daily  . pravastatin  40 mg Oral q1800  . sodium chloride  3 mL Intravenous Q12H   Continuous Infusions:   Principal Problem:   Acute on chronic diastolic HF (heart failure) Active Problems:   Obesity   Smoking   OSA (obstructive sleep apnea)   Acute respiratory failure with hypoxia   Anxiety   Heart failure   Hyperglycemia   Left knee pain   COPD exacerbation    Time spent: 30 min    [facility] [email or web address], password [facility] 12/09/2014, 2:37 PM  LOS: 2 days

## 2014-12-10 LAB — BASIC METABOLIC PANEL
ANION GAP: 9 (ref 5–15)
BUN: 12 mg/dL (ref 6–20)
CALCIUM: 8.7 mg/dL — AB (ref 8.9–10.3)
CO2: 37 mmol/L — ABNORMAL HIGH (ref 22–32)
CREATININE: 0.55 mg/dL (ref 0.44–1.00)
Chloride: 93 mmol/L — ABNORMAL LOW (ref 101–111)
Glucose, Bld: 114 mg/dL — ABNORMAL HIGH (ref 65–99)
Potassium: 3.6 mmol/L (ref 3.5–5.1)
SODIUM: 139 mmol/L (ref 135–145)

## 2014-12-10 LAB — GLUCOSE, CAPILLARY
GLUCOSE-CAPILLARY: 119 mg/dL — AB (ref 65–99)
GLUCOSE-CAPILLARY: 119 mg/dL — AB (ref 65–99)
Glucose-Capillary: 120 mg/dL — ABNORMAL HIGH (ref 65–99)
Glucose-Capillary: 143 mg/dL — ABNORMAL HIGH (ref 65–99)

## 2014-12-10 MED ORDER — FUROSEMIDE 40 MG PO TABS
40.0000 mg | ORAL_TABLET | Freq: Two times a day (BID) | ORAL | Status: DC
  Administered 2014-12-10 – 2014-12-11 (×2): 40 mg via ORAL
  Filled 2014-12-10 (×2): qty 1

## 2014-12-10 MED ORDER — ENOXAPARIN SODIUM 80 MG/0.8ML ~~LOC~~ SOLN
65.0000 mg | SUBCUTANEOUS | Status: DC
  Administered 2014-12-10: 65 mg via SUBCUTANEOUS
  Filled 2014-12-10: qty 0.8

## 2014-12-10 MED ORDER — POTASSIUM CHLORIDE CRYS ER 20 MEQ PO TBCR
40.0000 meq | EXTENDED_RELEASE_TABLET | Freq: Once | ORAL | Status: AC
  Administered 2014-12-10: 40 meq via ORAL
  Filled 2014-12-10: qty 2

## 2014-12-10 MED ORDER — CLONAZEPAM 1 MG PO TABS
1.0000 mg | ORAL_TABLET | Freq: Once | ORAL | Status: AC
  Administered 2014-12-10: 1 mg via ORAL
  Filled 2014-12-10: qty 1

## 2014-12-10 MED ORDER — PREDNISONE 20 MG PO TABS
30.0000 mg | ORAL_TABLET | Freq: Every day | ORAL | Status: DC
  Administered 2014-12-11: 30 mg via ORAL
  Filled 2014-12-10 (×2): qty 1

## 2014-12-10 NOTE — Progress Notes (Signed)
[facility] PROGRESS NOTE  Michele Owens FFM:[id number] DOB: [birth year] DOA: 12/07/2014 PCP: Barbette Merino, MD  Brief Summary  Michele Owens is a 54 y.o. female, with COPD, ongoing tobacco use, OSA, Anxiety, HLD, HTN who presented to the ED with shortness of breath. She reported SOB with a productive cough bringing up clear fluid sometimes tinged with brown sputum for one week. Increased dyspnea with minimal exertion (such as bathing).  She was unable to list her medications/doseages or indications so there was some question about her compliance. She complained of left knee pain and stated she receives injections in her knee. She smokes 1/2 pack a day.  In ER, WBC of 19.1. Chest x-ray showed vascular congestion, interstitial edema and small bilateral pleural effusions. The patient was in mild to moderate distress due to difficulty breathing and anxiety.  Assessment/Plan  Acute respiratory failure probably due to a combination of acute diastolic heart failure and acute COPD.  improving. Wean oxygen. Increase activity. Wean prednisone. Change Lasix to by mouth. Likely home tomorrow.  Chest pain probably due to the above.   -  ECHO without regional wall motion abl -  troponins negative  Leukocytosis Likely secondary to both ongoing prednisone use and acute COPD exacerbation  Anxiety Currently with panic attack. We'll give Klonopin now.  Obstructive sleep apnea Does not use CPAP at home.  Obesity Discussed weight loss. Will ask nutrition to see her as well.  New diagnosis of diabetes mellitus, A1c 6.5, may be elevated by steroid injections -  Nutrition and diabetic educator consultation -  Continue SSI  Code Status: full Family Communication: patient alone Disposition Plan:    Consultants:  None  Procedures:  ECHO  Antibiotics:  none   HPI/Subjective: Feels anxious, like she is having a panic attack. No other new complaints.  Objective: Filed Vitals:    12/10/14 0500 12/10/14 0630 12/10/14 0903 12/10/14 0945  BP: 143/75   127/71  Pulse: 65   79  Temp: 97.8 F (36.6 C)     TempSrc: Oral     Resp:      Height:      Weight:  133.448 kg (294 lb 3.2 oz)    SpO2: 94%  92%     Intake/Output Summary (Last 24 hours) at 12/10/14 1342 Last data filed at 12/10/14 1205  Gross per 24 hour  Intake    530 ml  Output   4800 ml  Net  -4270 ml   Filed Weights   12/08/14 0510 12/09/14 0358 12/10/14 0630  Weight: 128.822 kg (284 lb) 134.9 kg (297 lb 6.4 oz) 133.448 kg (294 lb 3.2 oz)   Body mass index is 48.96 kg/(m^2).  Exam:   General:  Obese female, at edge of bed. Seems anxious.  Cardiovascular:  RRR, nl S1, S2 no mrg, 2+ pulses, warm extremities  Respiratory:  Clear to auscultation bilaterally without wheezes rhonchi or rales. Good air movement.  Abdomen:   NABS, soft, NT/ND  MSK:   Normal tone and bulk, trace edema  Data Reviewed: Basic Metabolic Panel:  Recent Labs Lab 12/07/14 1327 12/08/14 0506 12/09/14 0403 12/10/14 0549  NA 138 137 139 139  K 4.0 3.9 4.1 3.6  CL 101 96* 96* 93*  CO2 27 33* 35* 37*  GLUCOSE 248* 197* 144* 114*  BUN 8 9 12 12   CREATININE 0.65 0.54 0.57 0.55  CALCIUM 9.0 9.1 9.3 8.7*   Liver Function Tests: No results for input(s): AST, ALT, ALKPHOS, BILITOT, PROT, ALBUMIN in the  last 168 hours. No results for input(s): LIPASE, AMYLASE in the last 168 hours. No results for input(s): AMMONIA in the last 168 hours. CBC:  Recent Labs Lab 12/07/14 1327 12/09/14 0403  WBC 19.1* 38.7*  HGB 11.7* 12.4  HCT 40.1 41.8  MCV 89.3 88.6  PLT 323 440*    No results found for this or any previous visit (from the past 240 hour(s)).   Studies: No results found.  Scheduled Meds: . budesonide (PULMICORT) nebulizer solution  0.5 mg Nebulization BID  . clonazePAM  1 mg Oral Once  . colchicine  0.6 mg Oral BID  . doxycycline  100 mg Oral Q12H  . enoxaparin (LOVENOX) injection  65 mg Subcutaneous Q24H   . furosemide  80 mg Intravenous BID  . insulin aspart  0-9 Units Subcutaneous TID WC  . insulin glargine  10 Units Subcutaneous QHS  . lisinopril  2.5 mg Oral Daily  . metoprolol succinate  50 mg Oral Daily  . mirtazapine  15 mg Oral QHS  . nicotine  14 mg Transdermal Daily  . pantoprazole  40 mg Oral Daily  . potassium chloride  40 mEq Oral Daily  . pravastatin  40 mg Oral q1800  . predniSONE  40 mg Oral BID WC  . sodium chloride  3 mL Intravenous Q12H   Continuous Infusions:   Principal Problem:   Acute on chronic diastolic HF (heart failure) Active Problems:   Obesity   Smoking   OSA (obstructive sleep apnea)   Acute respiratory failure with hypoxia   Anxiety   Heart failure   Hyperglycemia   Left knee pain   COPD exacerbation  Time spent: 25 min  [facility] [email or web address], password [facility] 12/10/2014, 1:42 PM  LOS: 3 days

## 2014-12-10 NOTE — Progress Notes (Signed)
Pt refuses colchicine because "I don't have the gouch, I ain't takin no meds for somethin I don't have". Pt educated that colchicine has other uses. Pt took all other scheduled medications. Will continue to monitor closely.

## 2014-12-10 NOTE — Progress Notes (Signed)
Pharmacy note: lovenox  54 yo female on lovenox for DVT prophylaxis. Current dose 70mg  [place] q24h.  Dose is 0.5mg /kg q24hr due to BMI. SCr=  0.55, CrCl > 100 Body mass index is 48.96 kg/(m^2). -current wt 133.4kg  Plan -Will change lovenox to 65mg  [place] q24hr  Hildred Laser, Pharm D 12/10/2014 10:25 AM

## 2014-12-10 NOTE — Progress Notes (Signed)
Pt educated and refused to have bed alarm turned on through the night. Will continue to monitor pt closely.

## 2014-12-11 LAB — BASIC METABOLIC PANEL
ANION GAP: 8 (ref 5–15)
BUN: 14 mg/dL (ref 6–20)
CO2: 34 mmol/L — AB (ref 22–32)
Calcium: 8.8 mg/dL — ABNORMAL LOW (ref 8.9–10.3)
Chloride: 95 mmol/L — ABNORMAL LOW (ref 101–111)
Creatinine, Ser: 0.61 mg/dL (ref 0.44–1.00)
GFR calc Af Amer: >60 mL/min (ref >60)
GLUCOSE: 80 mg/dL (ref 65–99)
POTASSIUM: 3.9 mmol/L (ref 3.5–5.1)
Sodium: 137 mmol/L (ref 135–145)

## 2014-12-11 LAB — GLUCOSE, CAPILLARY: Glucose-Capillary: 100 mg/dL — ABNORMAL HIGH (ref 65–99)

## 2014-12-11 MED ORDER — PREDNISONE 10 MG PO TABS: ORAL_TABLET | ORAL | Status: DC

## 2014-12-11 MED ORDER — POTASSIUM CHLORIDE CRYS ER 20 MEQ PO TBCR: 20.0000 meq | EXTENDED_RELEASE_TABLET | Freq: Every day | ORAL | Status: DC

## 2014-12-11 MED ORDER — FUROSEMIDE 20 MG PO TABS: 40.0000 mg | ORAL_TABLET | Freq: Every day | ORAL | Status: DC

## 2014-12-11 MED ORDER — METFORMIN HCL 500 MG PO TABS: 500.0000 mg | ORAL_TABLET | Freq: Two times a day (BID) | ORAL | Status: DC

## 2014-12-11 NOTE — Progress Notes (Signed)
MD made aware of run of SB/junctional brady at 0048 while pt sleeping. Pt was asymptomatic. O2 applied at 1L. Strips have been saved in chart. No further episodes have occurred. Have been and will continue to monitor patient closely.

## 2014-12-11 NOTE — Discharge Summary (Signed)
Physician Discharge Summary  Michele Owens IHK:[id number] DOB: [birth year] DOA: 12/07/2014  PCP: Barbette Merino, MD  Admit date: 12/07/2014 Discharge date: 12/11/2014  Time spent: greater than 30 minutes  Recommendations for Outpatient Follow-up:  1. Monitor weights 2. Monitor blood glucose  Discharge Diagnoses:  Principal Problem:   Acute on chronic diastolic HF (heart failure) Active Problems:   Obesity   Smoking   OSA (obstructive sleep apnea)   Acute respiratory failure with hypoxia   Anxiety   Heart failure hyperglycemia   Left knee pain   COPD exacerbation   Discharge Condition: stable  Diet recommendation: heart healthy carbohydrate modified  Filed Weights   12/09/14 0358 12/10/14 0630 12/11/14 0457  Weight: 134.9 kg (297 lb 6.4 oz) 133.448 kg (294 lb 3.2 oz) 134.809 kg (297 lb 3.2 oz)    History of present illness/Hospital Course:  54 y.o. female, with COPD, ongoing tobacco use, OSA, Anxiety, HLD, HTN who presented to the ED with shortness of breath. She reported SOB with a productive cough bringing up clear fluid sometimes tinged with brown sputum for one week. Increased dyspnea with minimal exertion (such as bathing). She was unable to list her medications/doseages or indications so there was some question about her compliance. She complained of left knee pain and stated she receives injections in her knee. She smokes 1/2 pack a day. In ER, WBC of 19.1. Chest x-ray showed vascular congestion, interstitial edema and small bilateral pleural effusions. The patient was in mild to moderate distress due to difficulty breathing and anxiety.  Acute respiratory failure probably due to a combination of acute diastolic heart failure and acute COPD. improving with diuresis, steroids, bronchodilators. Echo showed normal EF. See report below  Chest pain probably due to the above.  - ECHO without regional wall motion abnormalities - troponins negative  Anxiety Had panic  attack in hospital requiring klonipin  Obstructive sleep apnea Does not use CPAP at home.  Obesity Discussed weight loss. Will ask nutrition to see her as well.  New diagnosis of diabetes mellitus, A1c 6.5, may be elevated by steroid injections - Nutrition and diabetic educator consultation Started on metformin   Procedures:  none  Consultations:  none  Discharge Exam: Filed Vitals:   12/11/14 0457  BP: 128/59  Pulse: 82  Temp: 97.9 F (36.6 C)  Resp:     General: a and o Cardiovascular: RRR Respiratory: CTA  Discharge Instructions   Discharge Instructions    Diet - low sodium heart healthy    Complete by:  As directed      Diet Carb Modified    Complete by:  As directed      Increase activity slowly    Complete by:  As directed           Current Discharge Medication List    START taking these medications   Details  metFORMIN (GLUCOPHAGE) 500 MG tablet Take 1 tablet (500 mg total) by mouth 2 (two) times daily with a meal. Qty: 30 tablet, Refills: 1    potassium chloride SA (K-DUR,KLOR-CON) 20 MEQ tablet Take 1 tablet (20 mEq total) by mouth daily. Qty: 30 tablet, Refills: 1    predniSONE (DELTASONE) 10 MG tablet 2 tablets on Saturday, 1 tablet on Sunday then stop Qty: 3 tablet, Refills: 0      CONTINUE these medications which have CHANGED   Details  furosemide (LASIX) 20 MG tablet Take 2 tablets (40 mg total) by mouth daily. Qty: 30 tablet, Refills: 1  CONTINUE these medications which have NOT CHANGED   Details  albuterol (PROVENTIL HFA;VENTOLIN HFA) 108 (90 BASE) MCG/ACT inhaler Inhale 1-2 puffs into the lungs every 6 (six) hours as needed for wheezing. Qty: 1 Inhaler, Refills: 0    albuterol (PROVENTIL) (2.5 MG/3ML) 0.083% nebulizer solution Take 5 mg by nebulization every 6 (six) hours as needed for wheezing or shortness of breath.    clonazePAM (KLONOPIN) 2 MG tablet Take 2 mg by mouth daily. Refills: 0    COLCHICINE PO Take 0.6  mg by mouth 2 (two) times daily.     HYDROcodone-acetaminophen (NORCO/VICODIN) 5-325 MG per tablet Take 1 tablet by mouth 2 (two) times daily as needed for moderate pain.    LORATADINE PO Take 10 mg by mouth daily at 12 noon.     metoprolol succinate (TOPROL-XL) 50 MG 24 hr tablet Take 1 tablet (50 mg total) by mouth daily. Qty: 30 tablet, Refills: 11   Associated Diagnoses: Essential hypertension, benign    mirtazapine (REMERON) 15 MG tablet Take 15 mg by mouth at bedtime. Refills: 0    omeprazole (PRILOSEC) 40 MG capsule Take 40 mg by mouth daily.    pravastatin (PRAVACHOL) 20 MG tablet Take 1 tablet (20 mg total) by mouth daily. Qty: 30 tablet, Refills: 2    zolpidem (AMBIEN) 10 MG tablet Take 10 mg by mouth at bedtime as needed for sleep.      STOP taking these medications     naproxen (NAPROSYN) 500 MG tablet        Allergies  Allergen Reactions  . Lamisil [Terbinafine Hcl]     Pt states causes her to feel funny and rash.  . Ibuprofen Rash    Severe rash   Follow-up Information    Follow up with GARBA,LAWAL, MD In 2 weeks.   Specialty:  Internal Medicine   Why:  to check potassium, blood sugars and creatinine   Contact information:   [address] [place] [address] [phone]        The results of significant diagnostics from this hospitalization (including imaging, microbiology, ancillary and laboratory) are listed below for reference.    Significant Diagnostic Studies: Dg Chest 2 View  12/07/2014   CLINICAL DATA:  Cough and shortness of breath for 2 days.  EXAM: CHEST  2 VIEW  COMPARISON:  02/25/2014  FINDINGS: The heart is mildly enlarged. There is tortuosity and mild ectasia of the thoracic aorta. There is vascular congestion, interstitial edema and small bilateral pleural effusions along with overlying atelectasis and scarring. The bony thorax is intact.  IMPRESSION: CHF with small effusions and areas of atelectasis and scarring.   Electronically  Signed   By: Marijo Sanes M.D.   On: 12/07/2014 12:27   Echo               *[facility]*            [address], [place] [address]              [phone]  ------------------------------------------------------------------- Transthoracic Echocardiography  Patient:  Michele Owens, Michele Owens MR #:    [id number] Study Date: 12/08/2014 Gender:   F Age:    54 Height:   165.1 cm Weight:   128.8 kg BSA:    2.51 m^2 Pt. Status: Room:    3W02C  ATTENDING  [place]  [place] [id number] SONOGRAPHER Wyatt Mage, RDCS ORDERING   [place], [place] L REFERRING  [place], [place] L PERFORMING  [facility], Inpatient  cc:  ------------------------------------------------------------------- LV EF: 55% -  60%  ------------------------------------------------------------------- Indications:   Dyspnea (R06.00).  ------------------------------------------------------------------- History:  PMH:  Syncope. Congestive heart failure. Chronic obstructive pulmonary disease. Risk factors: Acute respiratory failure. OSA. Anxiety. Knee pain. Hip pain. SVT. Current tobacco use. Obese.  ------------------------------------------------------------------- Study Conclusions  - Left ventricle: The cavity size was normal. Systolic function was normal. The estimated ejection fraction was in the range of 55% to 60%. Wall motion was normal; there were no regional wall motion abnormalities. Features are consistent with a pseudonormal left ventricular filling pattern, with concomitant abnormal relaxation and increased filling pressure (grade 2 diastolic dysfunction). Doppler parameters are consistent with high ventricular filling pressure. - Aortic valve: Valve area (VTI): 2 cm^2. Valve area (Vmax): 2.04 cm^2. Valve  area (Vmean): 1.88 cm^2. - Tricuspid valve: There was trivial regurgitation. Microbiology: No results found for this or any previous visit (from the past 240 hour(s)).   Labs: Basic Metabolic Panel:  Recent Labs Lab 12/07/14 1327 12/08/14 0506 12/09/14 0403 12/10/14 0549 12/11/14 0521  NA 138 137 139 139 137  K 4.0 3.9 4.1 3.6 3.9  CL 101 96* 96* 93* 95*  CO2 27 33* 35* 37* 34*  GLUCOSE 248* 197* 144* 114* 80  BUN 8 9 12 12 14   CREATININE 0.65 0.54 0.57 0.55 0.61  CALCIUM 9.0 9.1 9.3 8.7* 8.8*   Liver Function Tests: No results for input(s): AST, ALT, ALKPHOS, BILITOT, PROT, ALBUMIN in the last 168 hours. No results for input(s): LIPASE, AMYLASE in the last 168 hours. No results for input(s): AMMONIA in the last 168 hours. CBC:  Recent Labs Lab 12/07/14 1327 12/09/14 0403  WBC 19.1* 38.7*  HGB 11.7* 12.4  HCT 40.1 41.8  MCV 89.3 88.6  PLT 323 440*   Cardiac Enzymes:  Recent Labs Lab 12/07/14 1830 12/07/14 2253 12/08/14 0506  TROPONINI <0.03 <0.03 <0.03   BNP: BNP (last 3 results)  Recent Labs  12/07/14 1327  BNP 97.3    ProBNP (last 3 results) No results for input(s): PROBNP in the last 8760 hours.  CBG:  Recent Labs Lab 12/10/14 0748 12/10/14 1138 12/10/14 1631 12/10/14 2104 12/11/14 0731  GLUCAP 120* 119* 119* 143* 100*       Signed:  Fredda Clarida L  [facility] 12/11/2014, 8:56 AM

## 2014-12-11 NOTE — Discharge Instructions (Signed)
Smoking Cessation, Tips for Success  If you are ready to quit smoking, congratulations! You have chosen to help yourself be healthier. Cigarettes bring nicotine, tar, carbon monoxide, and other irritants into your body. Your lungs, heart, and blood vessels will be able to work better without these poisons. There are many different ways to quit smoking. Nicotine gum, nicotine patches, a nicotine inhaler, or nicotine nasal spray can help with physical craving. Hypnosis, support groups, and medicines help break the habit of smoking.  WHAT THINGS CAN I DO TO MAKE QUITTING EASIER?   Here are some tips to help you quit for good:  · Pick a date when you will quit smoking completely. Tell all of your friends and family about your plan to quit on that date.  · Do not try to slowly cut down on the number of cigarettes you are smoking. Pick a quit date and quit smoking completely starting on that day.  · Throw away all cigarettes.    · Clean and remove all ashtrays from your home, work, and car.  · On a card, write down your reasons for quitting. Carry the card with you and read it when you get the urge to smoke.  · Cleanse your body of nicotine. Drink enough water and fluids to keep your urine clear or pale yellow. Do this after quitting to flush the nicotine from your body.  · Learn to predict your moods. Do not let a bad situation be your excuse to have a cigarette. Some situations in your life might tempt you into wanting a cigarette.  · Never have "just one" cigarette. It leads to wanting another and another. Remind yourself of your decision to quit.  · Change habits associated with smoking. If you smoked while driving or when feeling stressed, try other activities to replace smoking. Stand up when drinking your coffee. Brush your teeth after eating. Sit in a different chair when you read the paper. Avoid alcohol while trying to quit, and try to drink fewer caffeinated beverages. Alcohol and caffeine may urge you to  smoke.  · Avoid foods and drinks that can trigger a desire to smoke, such as sugary or spicy foods and alcohol.  · Ask people who smoke not to smoke around you.  · Have something planned to do right after eating or having a cup of coffee. For example, plan to take a walk or exercise.  · Try a relaxation exercise to calm you down and decrease your stress. Remember, you may be tense and nervous for the first 2 weeks after you quit, but this will pass.  · Find new activities to keep your hands busy. Play with a pen, coin, or rubber band. Doodle or draw things on paper.  · Brush your teeth right after eating. This will help cut down on the craving for the taste of tobacco after meals. You can also try mouthwash.    · Use oral substitutes in place of cigarettes. Try using lemon drops, carrots, cinnamon sticks, or chewing gum. Keep them handy so they are available when you have the urge to smoke.  · When you have the urge to smoke, try deep breathing.  · Designate your home as a nonsmoking area.  · If you are a heavy smoker, ask your health care provider about a prescription for nicotine chewing gum. It can ease your withdrawal from nicotine.  · Reward yourself. Set aside the cigarette money you save and buy yourself something nice.  · Look for   support from others. Join a support group or smoking cessation program. Ask someone at home or at work to help you with your plan to quit smoking.  · Always ask yourself, "Do I need this cigarette or is this just a reflex?" Tell yourself, "Today, I choose not to smoke," or "I do not want to smoke." You are reminding yourself of your decision to quit.  · Do not replace cigarette smoking with electronic cigarettes (commonly called e-cigarettes). The safety of e-cigarettes is unknown, and some may contain harmful chemicals.  · If you relapse, do not give up! Plan ahead and think about what you will do the next time you get the urge to smoke.  HOW WILL I FEEL WHEN I QUIT SMOKING?  You  may have symptoms of withdrawal because your body is used to nicotine (the addictive substance in cigarettes). You may crave cigarettes, be irritable, feel very hungry, cough often, get headaches, or have difficulty concentrating. The withdrawal symptoms are only temporary. They are strongest when you first quit but will go away within 10-14 days. When withdrawal symptoms occur, stay in control. Think about your reasons for quitting. Remind yourself that these are signs that your body is healing and getting used to being without cigarettes. Remember that withdrawal symptoms are easier to treat than the major diseases that smoking can cause.   Even after the withdrawal is over, expect periodic urges to smoke. However, these cravings are generally short lived and will go away whether you smoke or not. Do not smoke!  WHAT RESOURCES ARE AVAILABLE TO HELP ME QUIT SMOKING?  Your health care provider can direct you to community resources or hospitals for support, which may include:  · Group support.  · Education.  · Hypnosis.  · Therapy.  Document Released: 12/24/2003 Document Revised: 08/11/2013 Document Reviewed: 09/12/2012  [employer]® Patient Information ©2015 [employer]. This information is not intended to replace advice given to you by your health care provider. Make sure you discuss any questions you have with your health care provider.

## 2014-12-14 IMAGING — CR DG CHEST 2V
2 series · 2 of 2 positions shown · non-contrast
Comparison: 02/28/2012.

CLINICAL DATA: Fever, cough and wheezing.

CHEST - 2 VIEW

[view not recorded (1 of 2)]
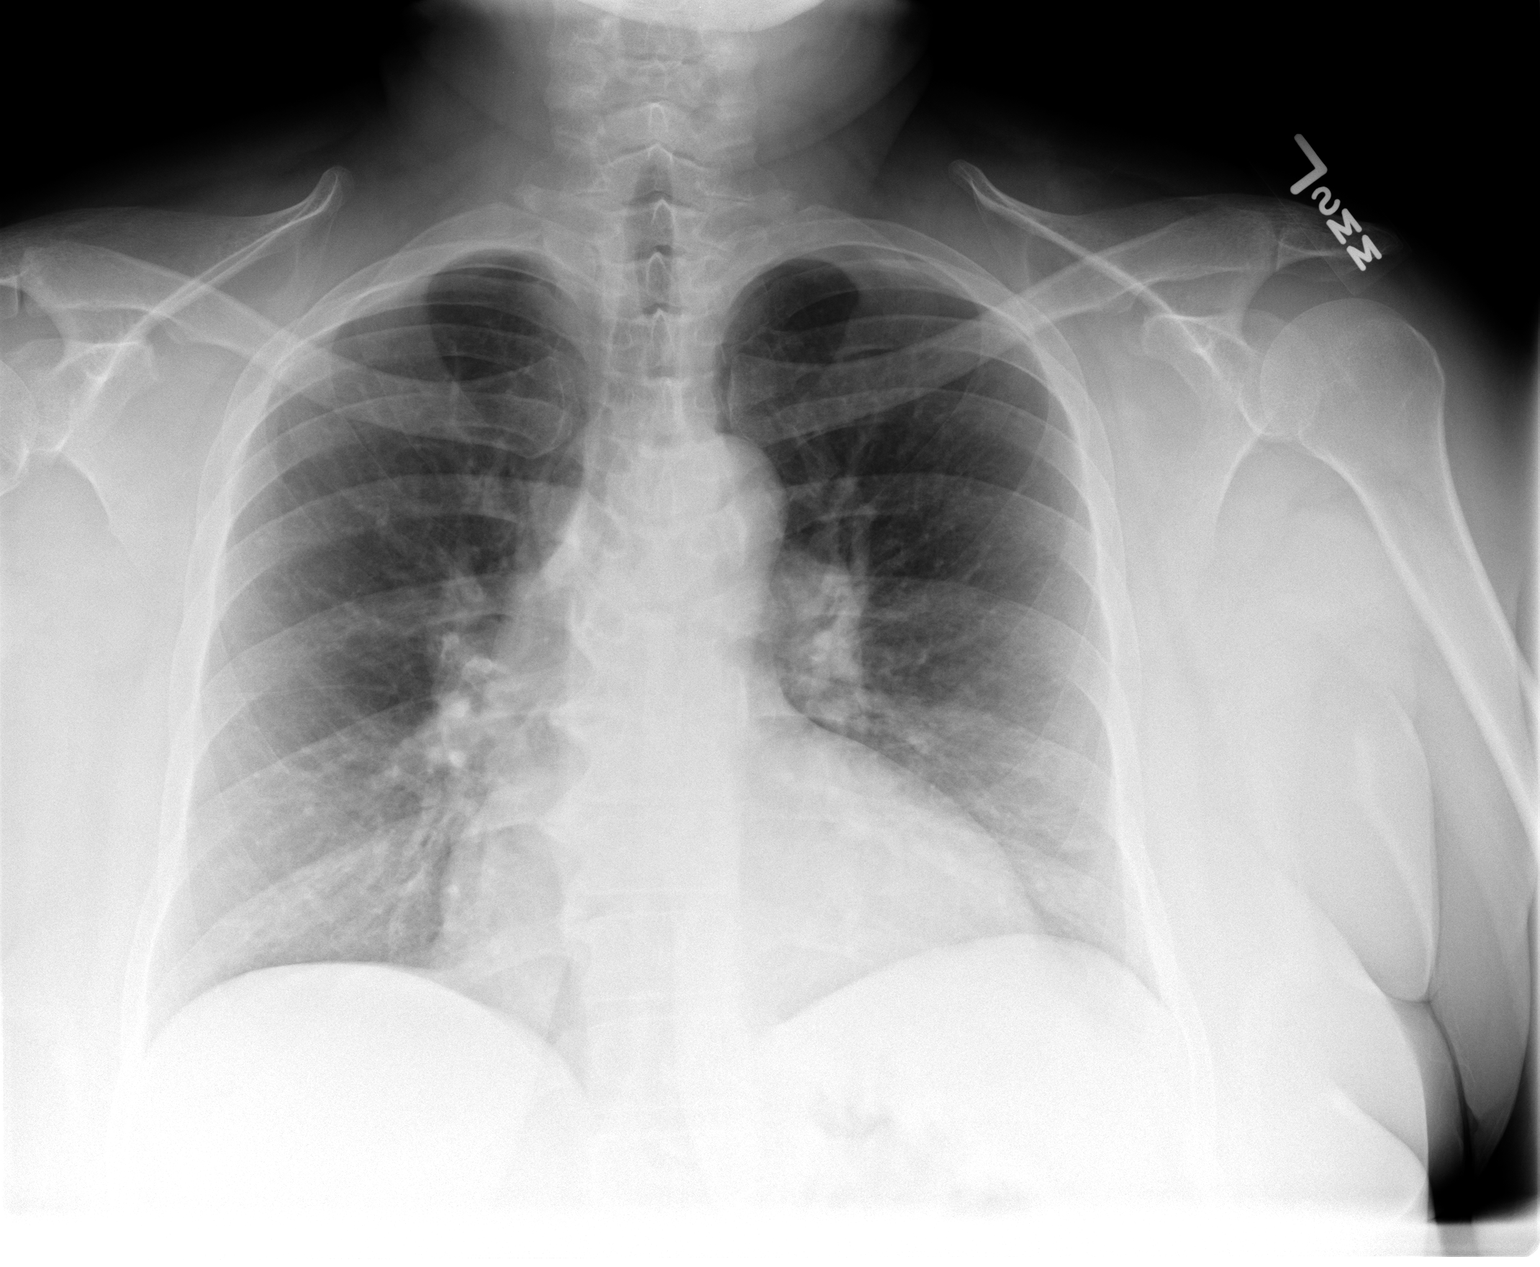

[view not recorded (2 of 2)]
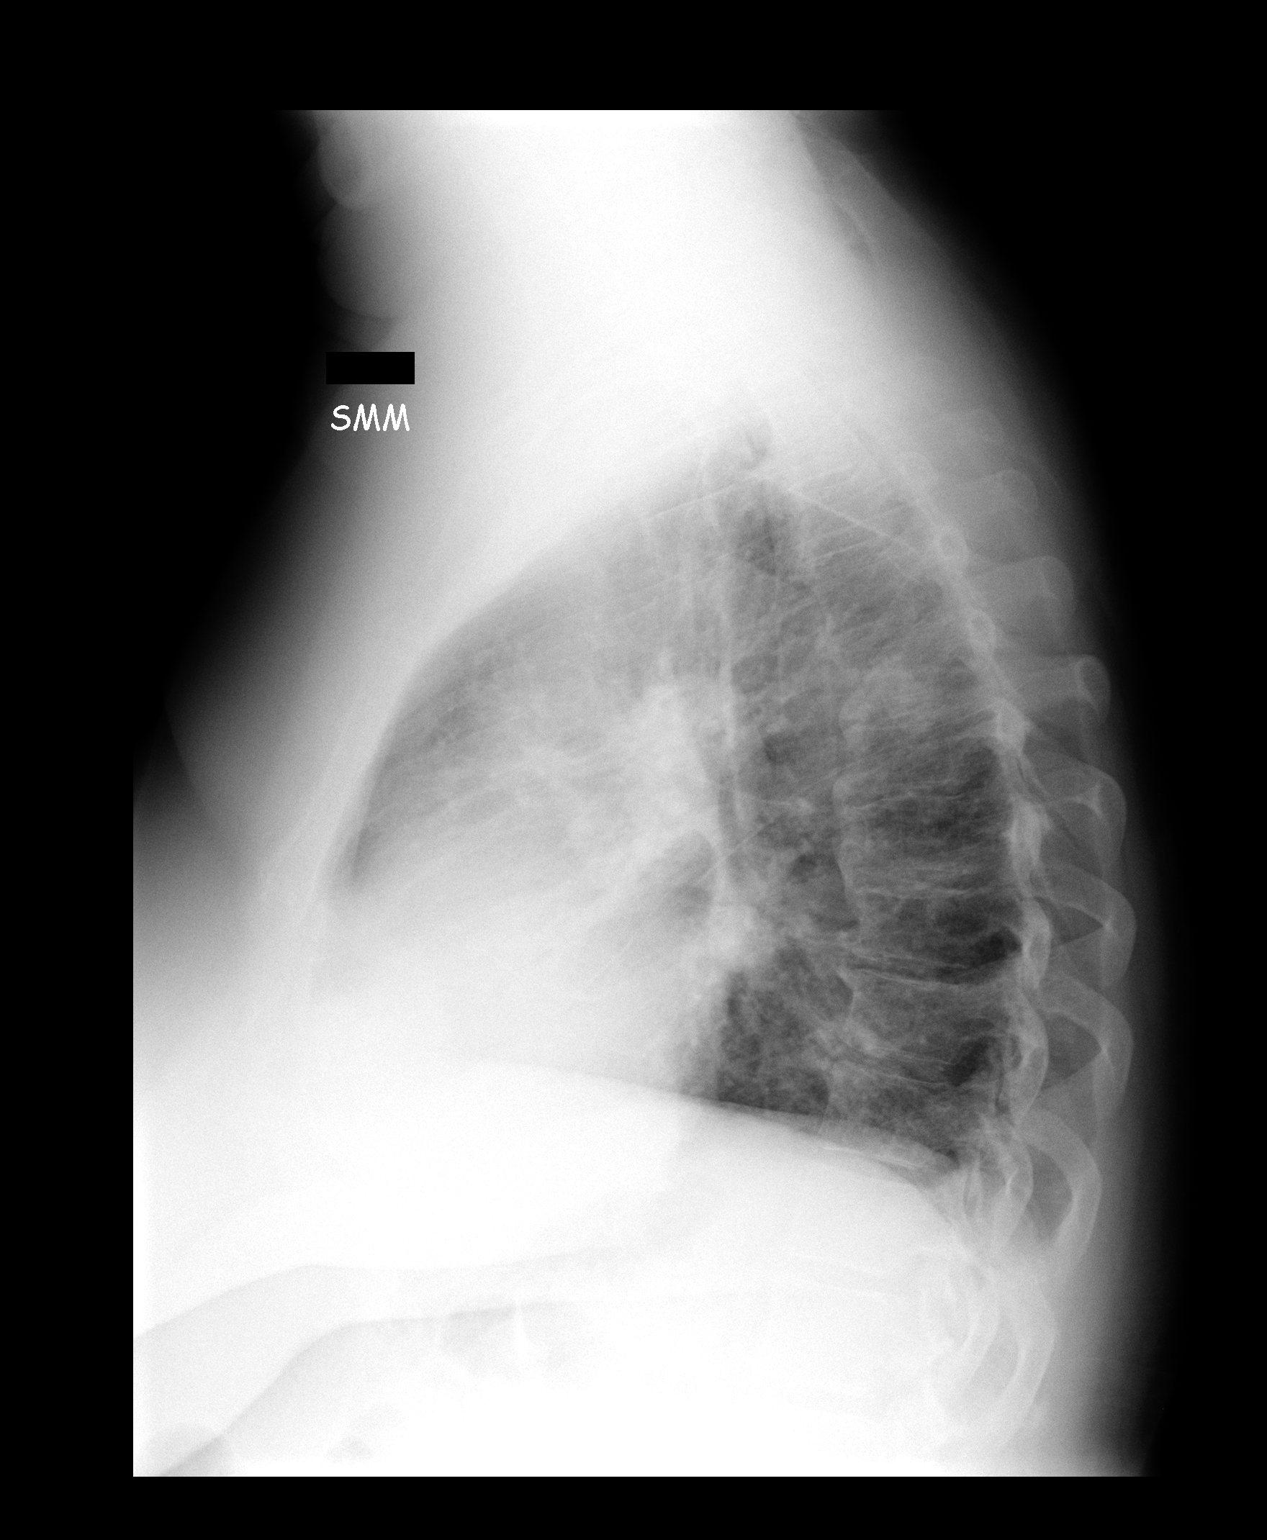

[2 of 2 positions shown; findings below may reference images not displayed]

FINDINGS: The heart is borderline enlarged but stable.  There is
tortuosity of the thoracic aorta.  The lungs are clear.  No pleural
effusion.  The bony thorax is intact.
IMPRESSION: No acute cardiopulmonary findings.

## 2015-01-26 ENCOUNTER — Ambulatory Visit: Payer: Medicaid Other | Admitting: Podiatry

## 2015-03-23 ENCOUNTER — Encounter: Payer: Self-pay | Admitting: Podiatry

## 2015-03-23 ENCOUNTER — Ambulatory Visit (INDEPENDENT_AMBULATORY_CARE_PROVIDER_SITE_OTHER): Payer: Medicaid Other | Admitting: Podiatry

## 2015-03-23 VITALS — BP 127/85 | HR 104 | Resp 18

## 2015-03-23 DIAGNOSIS — B351 Tinea unguium: Secondary | ICD-10-CM | POA: Diagnosis not present

## 2015-03-23 DIAGNOSIS — M79676 Pain in unspecified toe(s): Secondary | ICD-10-CM

## 2015-03-23 DIAGNOSIS — E1142 Type 2 diabetes mellitus with diabetic polyneuropathy: Secondary | ICD-10-CM

## 2015-03-23 DIAGNOSIS — M2040 Other hammer toe(s) (acquired), unspecified foot: Secondary | ICD-10-CM | POA: Diagnosis not present

## 2015-03-23 NOTE — Progress Notes (Signed)
She presents today with chief complaint of painful elongated toenails. She is also complaining of tingling to the tips of her toes.  Objective: I'll signs are stable she is alert and oriented 3. Pulses are palpable. Mild hammertoe deformities are noted bilateral neurologic sensorium appears to be intact though more than likely developing some early signs of peripheral neuropathy. Her toenails are thick yellow dystrophic onychomycotic and painful palpation as well as debridement.  Assessment: limb secondary to onychomycosis bilateral.   Plan: Debridement of nails 1 through 5 bilateral.

## 2015-04-06 ENCOUNTER — Telehealth: Payer: Self-pay | Admitting: *Deleted

## 2015-04-06 NOTE — Telephone Encounter (Signed)
Try [facility].  If not then no shoes.

## 2015-04-06 NOTE — Telephone Encounter (Signed)
Patient called the office stating she has been told by [facility] that they are no longer doing diabetic shoes for medicaid as they are not paying.  Where can she get her shoes?

## 2015-04-24 ENCOUNTER — Inpatient Hospital Stay (HOSPITAL_COMMUNITY)
Admission: EM | Admit: 2015-04-24 | Discharge: 2015-04-27 | DRG: 190 | Disposition: A | Discharge status: Home Health | Payer: Medicaid Other | Attending: Internal Medicine | Admitting: Internal Medicine

## 2015-04-24 ENCOUNTER — Emergency Department (HOSPITAL_COMMUNITY): Payer: Medicaid Other

## 2015-04-24 ENCOUNTER — Encounter (HOSPITAL_COMMUNITY): Payer: Self-pay | Admitting: Emergency Medicine

## 2015-04-24 DIAGNOSIS — M1990 Unspecified osteoarthritis, unspecified site: Secondary | ICD-10-CM | POA: Diagnosis present

## 2015-04-24 DIAGNOSIS — I5032 Chronic diastolic (congestive) heart failure: Secondary | ICD-10-CM

## 2015-04-24 DIAGNOSIS — D649 Anemia, unspecified: Secondary | ICD-10-CM | POA: Diagnosis present

## 2015-04-24 DIAGNOSIS — Z886 Allergy status to analgesic agent status: Secondary | ICD-10-CM | POA: Diagnosis not present

## 2015-04-24 DIAGNOSIS — J9601 Acute respiratory failure with hypoxia: Secondary | ICD-10-CM | POA: Diagnosis not present

## 2015-04-24 DIAGNOSIS — Z809 Family history of malignant neoplasm, unspecified: Secondary | ICD-10-CM

## 2015-04-24 DIAGNOSIS — F329 Major depressive disorder, single episode, unspecified: Secondary | ICD-10-CM | POA: Diagnosis present

## 2015-04-24 DIAGNOSIS — Z791 Long term (current) use of non-steroidal anti-inflammatories (NSAID): Secondary | ICD-10-CM | POA: Diagnosis not present

## 2015-04-24 DIAGNOSIS — F1290 Cannabis use, unspecified, uncomplicated: Secondary | ICD-10-CM | POA: Diagnosis present

## 2015-04-24 DIAGNOSIS — E669 Obesity, unspecified: Secondary | ICD-10-CM | POA: Diagnosis present

## 2015-04-24 DIAGNOSIS — E119 Type 2 diabetes mellitus without complications: Secondary | ICD-10-CM | POA: Diagnosis present

## 2015-04-24 DIAGNOSIS — J45901 Unspecified asthma with (acute) exacerbation: Secondary | ICD-10-CM

## 2015-04-24 DIAGNOSIS — Z79899 Other long term (current) drug therapy: Secondary | ICD-10-CM | POA: Diagnosis not present

## 2015-04-24 DIAGNOSIS — G4733 Obstructive sleep apnea (adult) (pediatric): Secondary | ICD-10-CM | POA: Diagnosis present

## 2015-04-24 DIAGNOSIS — F419 Anxiety disorder, unspecified: Secondary | ICD-10-CM | POA: Diagnosis present

## 2015-04-24 DIAGNOSIS — E1169 Type 2 diabetes mellitus with other specified complication: Secondary | ICD-10-CM | POA: Diagnosis present

## 2015-04-24 DIAGNOSIS — I471 Supraventricular tachycardia: Secondary | ICD-10-CM | POA: Diagnosis present

## 2015-04-24 DIAGNOSIS — E785 Hyperlipidemia, unspecified: Secondary | ICD-10-CM | POA: Diagnosis present

## 2015-04-24 DIAGNOSIS — D72829 Elevated white blood cell count, unspecified: Secondary | ICD-10-CM | POA: Diagnosis present

## 2015-04-24 DIAGNOSIS — Z9981 Dependence on supplemental oxygen: Secondary | ICD-10-CM | POA: Diagnosis not present

## 2015-04-24 DIAGNOSIS — J45909 Unspecified asthma, uncomplicated: Secondary | ICD-10-CM | POA: Diagnosis present

## 2015-04-24 DIAGNOSIS — Z888 Allergy status to other drugs, medicaments and biological substances status: Secondary | ICD-10-CM

## 2015-04-24 DIAGNOSIS — I10 Essential (primary) hypertension: Secondary | ICD-10-CM | POA: Diagnosis present

## 2015-04-24 DIAGNOSIS — F32A Depression, unspecified: Secondary | ICD-10-CM | POA: Diagnosis present

## 2015-04-24 DIAGNOSIS — Z90710 Acquired absence of both cervix and uterus: Secondary | ICD-10-CM | POA: Diagnosis not present

## 2015-04-24 DIAGNOSIS — Z6841 Body Mass Index (BMI) 40.0 and over, adult: Secondary | ICD-10-CM | POA: Diagnosis not present

## 2015-04-24 DIAGNOSIS — G43909 Migraine, unspecified, not intractable, without status migrainosus: Secondary | ICD-10-CM | POA: Diagnosis present

## 2015-04-24 DIAGNOSIS — F17210 Nicotine dependence, cigarettes, uncomplicated: Secondary | ICD-10-CM | POA: Diagnosis present

## 2015-04-24 DIAGNOSIS — Z720 Tobacco use: Secondary | ICD-10-CM | POA: Diagnosis present

## 2015-04-24 DIAGNOSIS — Z7984 Long term (current) use of oral hypoglycemic drugs: Secondary | ICD-10-CM

## 2015-04-24 DIAGNOSIS — J441 Chronic obstructive pulmonary disease with (acute) exacerbation: Principal | ICD-10-CM | POA: Diagnosis present

## 2015-04-24 DIAGNOSIS — K219 Gastro-esophageal reflux disease without esophagitis: Secondary | ICD-10-CM | POA: Diagnosis present

## 2015-04-24 DIAGNOSIS — I5031 Acute diastolic (congestive) heart failure: Secondary | ICD-10-CM | POA: Diagnosis present

## 2015-04-24 DIAGNOSIS — R0602 Shortness of breath: Secondary | ICD-10-CM | POA: Diagnosis present

## 2015-04-24 DIAGNOSIS — F418 Other specified anxiety disorders: Secondary | ICD-10-CM | POA: Diagnosis present

## 2015-04-24 HISTORY — DX: Type 2 diabetes mellitus without complications: E11.9

## 2015-04-24 LAB — BASIC METABOLIC PANEL
ANION GAP: 7 (ref 5–15)
BUN: 10 mg/dL (ref 6–20)
CO2: 34 mmol/L — ABNORMAL HIGH (ref 22–32)
Calcium: 9 mg/dL (ref 8.9–10.3)
Chloride: 100 mmol/L — ABNORMAL LOW (ref 101–111)
Creatinine, Ser: 0.47 mg/dL (ref 0.44–1.00)
Glucose, Bld: 122 mg/dL — ABNORMAL HIGH (ref 65–99)
POTASSIUM: 4.6 mmol/L (ref 3.5–5.1)
SODIUM: 141 mmol/L (ref 135–145)

## 2015-04-24 LAB — I-STAT CG4 LACTIC ACID, ED
LACTIC ACID, VENOUS: 2.03 mmol/L — AB (ref 0.5–2.0)
Lactic Acid, Venous: 2.35 mmol/L (ref 0.5–2.0)

## 2015-04-24 LAB — BLOOD GAS, VENOUS
ACID-BASE EXCESS: 5.3 mmol/L — AB (ref 0.0–2.0)
BICARBONATE: 30.6 meq/L — AB (ref 20.0–24.0)
FIO2: 0.21
O2 SAT: 92.7 %
PATIENT TEMPERATURE: 98.6
TCO2: 28.1 mmol/L (ref 0–100)
pCO2, Ven: 50.6 mmHg — ABNORMAL HIGH (ref 45.0–50.0)
pH, Ven: 7.398 — ABNORMAL HIGH (ref 7.250–7.300)
pO2, Ven: 65.5 mmHg — ABNORMAL HIGH (ref 30.0–45.0)

## 2015-04-24 LAB — CBC WITH DIFFERENTIAL/PLATELET
BASOS ABS: 0 10*3/uL (ref 0.0–0.1)
BASOS PCT: 0 %
EOS ABS: 0.2 10*3/uL (ref 0.0–0.7)
EOS PCT: 1 %
HCT: 36.4 % (ref 36.0–46.0)
Hemoglobin: 10.7 g/dL — ABNORMAL LOW (ref 12.0–15.0)
Lymphocytes Relative: 14 %
Lymphs Abs: 2.7 10*3/uL (ref 0.7–4.0)
MCH: 24.7 pg — ABNORMAL LOW (ref 26.0–34.0)
MCHC: 29.4 g/dL — ABNORMAL LOW (ref 30.0–36.0)
MCV: 84.1 fL (ref 78.0–100.0)
Monocytes Absolute: 0.8 10*3/uL (ref 0.1–1.0)
Monocytes Relative: 4 %
NEUTROS PCT: 81 %
Neutro Abs: 15.4 10*3/uL — ABNORMAL HIGH (ref 1.7–7.7)
PLATELETS: 311 10*3/uL (ref 150–400)
RBC: 4.33 MIL/uL (ref 3.87–5.11)
RDW: 17 % — ABNORMAL HIGH (ref 11.5–15.5)
WBC: 19 10*3/uL — AB (ref 4.0–10.5)

## 2015-04-24 LAB — TROPONIN I: Troponin I: <0.03 ng/mL (ref <0.031)

## 2015-04-24 LAB — BRAIN NATRIURETIC PEPTIDE: B Natriuretic Peptide: 111.3 pg/mL — ABNORMAL HIGH (ref 0.0–100.0)

## 2015-04-24 MED ORDER — SODIUM CHLORIDE 0.9 % IV SOLN: INTRAVENOUS | Status: DC

## 2015-04-24 MED ORDER — COLCHICINE 0.6 MG PO TABS
0.6000 mg | ORAL_TABLET | Freq: Two times a day (BID) | ORAL | Status: DC
  Administered 2015-04-24 – 2015-04-27 (×6): 0.6 mg via ORAL
  Filled 2015-04-24 (×7): qty 1

## 2015-04-24 MED ORDER — IPRATROPIUM BROMIDE 0.02 % IN SOLN
0.5000 mg | Freq: Once | RESPIRATORY_TRACT | Status: AC
  Administered 2015-04-24: 0.5 mg via RESPIRATORY_TRACT
  Filled 2015-04-24: qty 2.5

## 2015-04-24 MED ORDER — METHYLPREDNISOLONE SODIUM SUCC 125 MG IJ SOLR
125.0000 mg | Freq: Once | INTRAMUSCULAR | Status: AC
Start: 2015-04-24 — End: 2015-04-24
  Administered 2015-04-24: 125 mg via INTRAVENOUS
  Filled 2015-04-24: qty 2

## 2015-04-24 MED ORDER — NICOTINE 14 MG/24HR TD PT24
14.0000 mg | MEDICATED_PATCH | Freq: Every day | TRANSDERMAL | Status: DC
  Administered 2015-04-24 – 2015-04-27 (×4): 14 mg via TRANSDERMAL
  Filled 2015-04-24 (×4): qty 1

## 2015-04-24 MED ORDER — LORATADINE 10 MG PO TABS
10.0000 mg | ORAL_TABLET | Freq: Every day | ORAL | Status: DC
Start: 2015-04-24 — End: 2015-04-27
  Administered 2015-04-24 – 2015-04-27 (×4): 10 mg via ORAL
  Filled 2015-04-24 (×4): qty 1

## 2015-04-24 MED ORDER — METFORMIN HCL 500 MG PO TABS
500.0000 mg | ORAL_TABLET | Freq: Two times a day (BID) | ORAL | Status: DC
  Administered 2015-04-25 – 2015-04-27 (×5): 500 mg via ORAL
  Filled 2015-04-24 (×6): qty 1

## 2015-04-24 MED ORDER — IPRATROPIUM-ALBUTEROL 0.5-2.5 (3) MG/3ML IN SOLN
3.0000 mL | Freq: Four times a day (QID) | RESPIRATORY_TRACT | Status: DC | PRN
  Administered 2015-04-24: 3 mL via RESPIRATORY_TRACT

## 2015-04-24 MED ORDER — IPRATROPIUM-ALBUTEROL 0.5-2.5 (3) MG/3ML IN SOLN
3.0000 mL | Freq: Four times a day (QID) | RESPIRATORY_TRACT | Status: DC
  Administered 2015-04-24 – 2015-04-25 (×2): 3 mL via RESPIRATORY_TRACT
  Filled 2015-04-24 (×2): qty 3

## 2015-04-24 MED ORDER — METHYLPREDNISOLONE SODIUM SUCC 125 MG IJ SOLR
60.0000 mg | Freq: Two times a day (BID) | INTRAMUSCULAR | Status: DC
  Administered 2015-04-24 – 2015-04-26 (×4): 60 mg via INTRAVENOUS
  Filled 2015-04-24: qty 2
  Filled 2015-04-24 (×5): qty 0.96

## 2015-04-24 MED ORDER — ACETAMINOPHEN 650 MG RE SUPP: 650.0000 mg | Freq: Four times a day (QID) | RECTAL | Status: DC | PRN

## 2015-04-24 MED ORDER — HYDROCODONE-ACETAMINOPHEN 5-325 MG PO TABS
1.0000 | ORAL_TABLET | Freq: Once | ORAL | Status: AC
  Administered 2015-04-24: 1 via ORAL
  Filled 2015-04-24: qty 1

## 2015-04-24 MED ORDER — ALBUTEROL SULFATE (2.5 MG/3ML) 0.083% IN NEBU
5.0000 mg | INHALATION_SOLUTION | Freq: Once | RESPIRATORY_TRACT | Status: AC
  Administered 2015-04-24: 5 mg via RESPIRATORY_TRACT
  Filled 2015-04-24: qty 6

## 2015-04-24 MED ORDER — PANTOPRAZOLE SODIUM 40 MG PO TBEC
40.0000 mg | DELAYED_RELEASE_TABLET | Freq: Every day | ORAL | Status: DC
  Administered 2015-04-24 – 2015-04-27 (×4): 40 mg via ORAL
  Filled 2015-04-24 (×6): qty 1

## 2015-04-24 MED ORDER — IBUPROFEN 800 MG PO TABS
800.0000 mg | ORAL_TABLET | Freq: Four times a day (QID) | ORAL | Status: DC | PRN
  Filled 2015-04-24: qty 1

## 2015-04-24 MED ORDER — CETYLPYRIDINIUM CHLORIDE 0.05 % MT LIQD
7.0000 mL | Freq: Two times a day (BID) | OROMUCOSAL | Status: DC
  Administered 2015-04-25 – 2015-04-27 (×4): 7 mL via OROMUCOSAL

## 2015-04-24 MED ORDER — IPRATROPIUM-ALBUTEROL 0.5-2.5 (3) MG/3ML IN SOLN
3.0000 mL | Freq: Once | RESPIRATORY_TRACT | Status: AC
  Administered 2015-04-24: 3 mL via RESPIRATORY_TRACT
  Filled 2015-04-24: qty 3

## 2015-04-24 MED ORDER — ZOLPIDEM TARTRATE 5 MG PO TABS
5.0000 mg | ORAL_TABLET | Freq: Every evening | ORAL | Status: DC | PRN
  Administered 2015-04-25 – 2015-04-26 (×2): 5 mg via ORAL
  Filled 2015-04-24 (×2): qty 1

## 2015-04-24 MED ORDER — ONDANSETRON HCL 4 MG PO TABS: 4.0000 mg | ORAL_TABLET | Freq: Four times a day (QID) | ORAL | Status: DC | PRN

## 2015-04-24 MED ORDER — PRAVASTATIN SODIUM 40 MG PO TABS: 40.0000 mg | ORAL_TABLET | Freq: Every day | ORAL | Status: DC

## 2015-04-24 MED ORDER — ACETAMINOPHEN 325 MG PO TABS
650.0000 mg | ORAL_TABLET | Freq: Four times a day (QID) | ORAL | Status: DC | PRN
  Administered 2015-04-26: 650 mg via ORAL
  Filled 2015-04-24: qty 2

## 2015-04-24 MED ORDER — ONDANSETRON HCL 4 MG/2ML IJ SOLN: 4.0000 mg | Freq: Four times a day (QID) | INTRAMUSCULAR | Status: DC | PRN

## 2015-04-24 MED ORDER — FUROSEMIDE 10 MG/ML IJ SOLN
40.0000 mg | INTRAMUSCULAR | Status: AC
  Administered 2015-04-24: 40 mg via INTRAVENOUS
  Filled 2015-04-24: qty 4

## 2015-04-24 MED ORDER — METOPROLOL SUCCINATE ER 50 MG PO TB24
50.0000 mg | ORAL_TABLET | Freq: Every day | ORAL | Status: DC
  Administered 2015-04-24 – 2015-04-27 (×4): 50 mg via ORAL
  Filled 2015-04-24 (×4): qty 1

## 2015-04-24 MED ORDER — MIRTAZAPINE 15 MG PO TABS
15.0000 mg | ORAL_TABLET | Freq: Every day | ORAL | Status: DC
  Administered 2015-04-24 – 2015-04-26 (×3): 15 mg via ORAL
  Filled 2015-04-24 (×3): qty 1

## 2015-04-24 MED ORDER — IPRATROPIUM-ALBUTEROL 0.5-2.5 (3) MG/3ML IN SOLN
3.0000 mL | Freq: Once | RESPIRATORY_TRACT | Status: DC
  Filled 2015-04-24: qty 3

## 2015-04-24 MED ORDER — PRAVASTATIN SODIUM 20 MG PO TABS
40.0000 mg | ORAL_TABLET | Freq: Every day | ORAL | Status: DC
  Administered 2015-04-24 – 2015-04-26 (×3): 40 mg via ORAL
  Filled 2015-04-24 (×4): qty 1

## 2015-04-24 MED ORDER — FUROSEMIDE 40 MG PO TABS
40.0000 mg | ORAL_TABLET | Freq: Every day | ORAL | Status: DC
  Administered 2015-04-25 – 2015-04-27 (×3): 40 mg via ORAL
  Filled 2015-04-24 (×4): qty 1

## 2015-04-24 MED ORDER — POTASSIUM CHLORIDE CRYS ER 20 MEQ PO TBCR
20.0000 meq | EXTENDED_RELEASE_TABLET | Freq: Every day | ORAL | Status: DC
  Administered 2015-04-24 – 2015-04-27 (×3): 20 meq via ORAL
  Filled 2015-04-24 (×3): qty 1

## 2015-04-24 MED ORDER — CLONAZEPAM 1 MG PO TABS
2.0000 mg | ORAL_TABLET | Freq: Every day | ORAL | Status: DC
  Administered 2015-04-24 – 2015-04-27 (×4): 2 mg via ORAL
  Filled 2015-04-24 (×4): qty 2

## 2015-04-24 NOTE — ED Provider Notes (Signed)
CSN: [id number]     Arrival date & time 04/24/15  1439 History   First MD Initiated Contact with Patient 04/24/15 1508     Chief Complaint  Patient presents with  . Shortness of Breath     (Consider location/radiation/quality/duration/timing/severity/associated sxs/prior Treatment) HPI Comments: Patient presents to the ED with a chief complaint of SOB.  She states that she has been short of breath for months, but that this worsened today.  She thinks that it is because of the weather.  Patient is a poor historian.  Does report hx of COPD.  Uses home O2 at night; 2.5 L.  Per EMS, patient found to be 70% on RA.  Received neb en route to hospital.  Patient also complains of headache.  She denies any pain elsewhere.  Denies numbness, weakness, or tingling.   The history is provided by the patient. No language interpreter was used.    Past Medical History  Diagnosis Date  . Hypertension   . Pain in joint, pelvic region and thigh     back and hip pain  . HLD (hyperlipidemia)   . Tobacco abuse   . Obesity   . Ovarian cyst   . GERD (gastroesophageal reflux disease)   . SVT (supraventricular tachycardia) ([place])     a. 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  Marland Kitchen Hx of echocardiogram     a. Echo 4/13:   EF 55-60%.  . Depression   . Hx of cardiovascular stress test     a. ETT-MV 03/2012:   EF 60%, no ischemia    . Asthma   . Chronic bronchitis ([place])   . OSA (obstructive sleep apnea)     a. sleep study 1/14:  mod OSA with API of 29/hr and O2 sat nadir of 80%  . Dysrhythmia   . Migraine     "2-3 days out of a week" (12/07/2014)  . Arthritis     "LUE; LLE" (12/07/2014)  . Frequent urination    Past Surgical History  Procedure Laterality Date  . Bunionectomy Bilateral   . Tonsillectomy    . Total abdominal hysterectomy  07/14/10   Family History  Problem Relation Age of Onset  . Cancer Father    Social History  Substance Use Topics  . Smoking status:  Current Every Day Smoker -- 1.00 packs/day for 41 years    Types: Cigarettes  . Smokeless tobacco: Never Used  . Alcohol Use: Yes     Comment: 12/07/2014 "might have a beer a couple times/month"   OB History    No data available     Review of Systems  Constitutional: Negative for fever and chills.  Respiratory: Positive for cough, shortness of breath and wheezing.   Cardiovascular: Negative for chest pain.  Gastrointestinal: Negative for nausea, vomiting, diarrhea and constipation.  Genitourinary: Negative for dysuria.  Neurological: Positive for headaches.      Allergies  Lamisil and Ibuprofen  Home Medications   Prior to Admission medications   Medication Sig Start Date End Date Taking? Authorizing Provider  albuterol (PROVENTIL HFA;VENTOLIN HFA) 108 (90 BASE) MCG/ACT inhaler Inhale 1-2 puffs into the lungs every 6 (six) hours as needed for wheezing. 04/09/12  Yes Billy Fischer, MD  clonazePAM (KLONOPIN) 2 MG tablet Take 2 mg by mouth daily. 11/20/14  Yes Historical Provider, MD  COLCHICINE PO Take 0.6 mg by mouth 2 (two) times daily.    Yes Historical Provider, MD  furosemide (LASIX) 20  MG tablet Take 2 tablets (40 mg total) by mouth daily. 12/11/14  Yes [name], MD  HYDROcodone-acetaminophen (NORCO/VICODIN) 5-325 MG per tablet Take 1 tablet by mouth 2 (two) times daily as needed for moderate pain.   Yes Historical Provider, MD  loratadine (CLARITIN) 10 MG tablet Take 1 tab po qd 02/24/15  Yes Historical Provider, MD  metFORMIN (GLUCOPHAGE) 500 MG tablet Take 1 tablet (500 mg total) by mouth 2 (two) times daily with a meal. 12/11/14  Yes [name], MD  metoprolol succinate (TOPROL-XL) 50 MG 24 hr tablet Take 1 tablet (50 mg total) by mouth daily. 03/19/12  Yes Scott T Kathlen Mody, PA-C  mirtazapine (REMERON) 15 MG tablet Take 15 mg by mouth at bedtime. 11/16/14  Yes Historical Provider, MD  naproxen (NAPROSYN) 500 MG tablet Take 500 mg by mouth 2 (two) times daily.  03/09/15  Yes Historical Provider, MD  nicotine (NICODERM CQ - DOSED IN MG/24 HOURS) 21 mg/24hr patch apply 1 patch to clean dry skin every 24 hours 12/22/14  Yes Historical Provider, MD  omeprazole (PRILOSEC) 40 MG capsule Take 40 mg by mouth daily. 04/13/15  Yes Historical Provider, MD  potassium chloride SA (K-DUR,KLOR-CON) 20 MEQ tablet Take 1 tablet (20 mEq total) by mouth daily. 12/11/14  Yes [name], MD  pravastatin (PRAVACHOL) 20 MG tablet Take 1 tablet (20 mg total) by mouth daily. Patient taking differently: Take 40 mg by mouth daily.  07/22/12  Yes [name], MD  pravastatin (PRAVACHOL) 40 MG tablet Take 40 mg by mouth daily.  03/29/15  Yes Historical Provider, MD  SPIRIVA HANDIHALER 18 MCG inhalation capsule Place 1 capsule into inhaler and inhale daily.  03/12/15  Yes Historical Provider, MD  ibuprofen (ADVIL,MOTRIN) 800 MG tablet Take 800 mg by mouth 2 (two) times daily as needed. for pain 02/10/15   Historical Provider, MD  traMADol (ULTRAM) 50 MG tablet TAKE 1-2 TABLETS EVERY 8 HOURS AS NEEDED FOR PAIN 02/01/15   Historical Provider, MD  zolpidem (AMBIEN) 10 MG tablet Take 10 mg by mouth at bedtime as needed for sleep.    Historical Provider, MD   BP 139/67 mmHg  Pulse 80  Temp(Src) 98.1 F (36.7 C) (Oral)  Resp 22  SpO2 93% Physical Exam  Constitutional: She appears well-developed and well-nourished.  HENT:  Head: Normocephalic and atraumatic.  Eyes: Conjunctivae and EOM are normal. Pupils are equal, round, and reactive to light.  Neck: Normal range of motion. Neck supple.  Cardiovascular: Normal rate and regular rhythm.  Exam reveals no gallop and no friction rub.   No murmur heard. Pulmonary/Chest: Effort normal. No respiratory distress. She has wheezes. She has no rales. She exhibits no tenderness.  Bilateral wheezes and diminished lower lung sounds  Abdominal: Soft. Bowel sounds are normal. She exhibits no distension and no mass. There is no tenderness. There  is no rebound and no guarding.  Musculoskeletal: Normal range of motion. She exhibits no edema or tenderness.  Moves all extremities  Neurological: She is alert.  Oriented to person and place, but not time, intermittent slurred speech during hx, moves all extremities  Skin: Skin is warm and dry.  Psychiatric: She has a normal mood and affect. Her behavior is normal. Judgment and thought content normal.  Nursing note and vitals reviewed.   ED Course  Procedures (including critical care time) Results for orders placed or performed during the hospital encounter of 04/24/15  CBC with Differential/Platelet  Result Value Ref  Range   WBC 19.0 (H) 4.0 - 10.5 K/uL   RBC 4.33 3.87 - 5.11 MIL/uL   Hemoglobin 10.7 (L) 12.0 - 15.0 g/dL   HCT 36.4 36.0 - 46.0 %   MCV 84.1 78.0 - 100.0 fL   MCH 24.7 (L) 26.0 - 34.0 pg   MCHC 29.4 (L) 30.0 - 36.0 g/dL   RDW 17.0 (H) 11.5 - 15.5 %   Platelets 311 150 - 400 K/uL   Neutrophils Relative % 81 %   Neutro Abs 15.4 (H) 1.7 - 7.7 K/uL   Lymphocytes Relative 14 %   Lymphs Abs 2.7 0.7 - 4.0 K/uL   Monocytes Relative 4 %   Monocytes Absolute 0.8 0.1 - 1.0 K/uL   Eosinophils Relative 1 %   Eosinophils Absolute 0.2 0.0 - 0.7 K/uL   Basophils Relative 0 %   Basophils Absolute 0.0 0.0 - 0.1 K/uL  Basic metabolic panel  Result Value Ref Range   Sodium 141 135 - 145 mmol/L   Potassium 4.6 3.5 - 5.1 mmol/L   Chloride 100 (L) 101 - 111 mmol/L   CO2 34 (H) 22 - 32 mmol/L   Glucose, Bld 122 (H) 65 - 99 mg/dL   BUN 10 6 - 20 mg/dL   Creatinine, Ser 0.47 0.44 - 1.00 mg/dL   Calcium 9.0 8.9 - 10.3 mg/dL   GFR calc non Af Amer >60 >60 mL/min   GFR calc Af Amer >60 >60 mL/min   Anion gap 7 5 - 15  Blood gas, venous  Result Value Ref Range   FIO2 0.21    pH, Ven 7.398 (H) 7.250 - 7.300   pCO2, Ven 50.6 (H) 45.0 - 50.0 mmHg   pO2, Ven 65.5 (H) 30.0 - 45.0 mmHg   Bicarbonate 30.6 (H) 20.0 - 24.0 mEq/L   TCO2 28.1 0 - 100 mmol/L   Acid-Base Excess 5.3 (H)  0.0 - 2.0 mmol/L   O2 Saturation 92.7 %   Patient temperature 98.6    Collection site VEIN    Drawn by COLLECTED BY LABORATORY    Sample type VEIN   Troponin I  Result Value Ref Range   Troponin I <0.03 <0.031 ng/mL  Brain natriuretic peptide  Result Value Ref Range   B Natriuretic Peptide 111.3 (H) 0.0 - 100.0 pg/mL  I-Stat CG4 Lactic Acid, ED  Result Value Ref Range   Lactic Acid, Venous 2.03 (HH) 0.5 - 2.0 mmol/L   Comment NOTIFIED PHYSICIAN    Dg Chest 2 View  04/24/2015  CLINICAL DATA:  Shortness of breath, BILATERAL wheezing, history hypertension, COPD, asthma, chronic bronchitis, smoking EXAM: CHEST  2 VIEW COMPARISON:  12/07/2014 FINDINGS: Enlargement of cardiac silhouette with pulmonary vascular congestion. Mediastinal contours normal. Perihilar and basilar infiltrates likely pulmonary edema. No gross pleural effusion or pneumothorax. Scattered endplate spur formation thoracic spine. IMPRESSION: Mild CHF, little changed versus prior exam. Electronically Signed   By: Lavonia Dana M.D.   On: 04/24/2015 16:34   Ct Head Wo Contrast  04/24/2015  CLINICAL DATA:  Acute onset slurred speech EXAM: CT HEAD WITHOUT CONTRAST TECHNIQUE: Contiguous axial images were obtained from the base of the skull through the vertex without intravenous contrast. COMPARISON:  February 17, 2013 FINDINGS: The ventricles are normal in size and configuration. There is no intracranial mass, hemorrhage, extra-axial fluid collection, or midline shift. Gray-white compartments appear normal. No acute infarct evident. The bony calvarium appears intact. The mastoid air cells are clear. No intraorbital lesions are identified.  IMPRESSION: Study within normal limits. Electronically Signed   By: Lowella Grip III M.D.   On: 04/24/2015 16:07    I have personally reviewed and evaluated these images and lab results as part of my medical decision-making.   EKG Interpretation None      MDM   Final diagnoses:  COPD  exacerbation ([place])    Patient with hx of SOB and COPD.  Worsening SOB today.  EMS reports 70% on RA on arrival.  Improved with nebs.  Uses home O2 at night only.  Confused and drowsy at bedside.  Is easily arrousable, but not oriented to time/date.  Had an episode of slurred speech and complains of headache.  Will give another nebs, steroids, and CT head.  Patient seen by and discussed with Dr. Jeneen Rinks.  CXR shows mild CHF, but similar to previous.  Will give 40 IV lasix and check BNP, trop, and EKG per Dr. Jeneen Rinks' recommendations.  BNP is 111, CHF exacerbation thought to be less likely, troponin is negative. CBC remarkable for moderate leukocytosis at 19.  Patient reassessed, she is still wheezing. She ambulates to the bathroom with assistance, O2 saturation is 76%. She states that she still feels fatigued. Patient discussed again with Dr. Jeneen Rinks, who agrees with plan for admission.  Patient discussed with Dr. Charlies Silvers from [facility].  Discussed possibility of increasing home O2 and having patient wear this during the day, however we feel that the patient would benefit more from being admitted for COPD exacerbation with persistent wheezing. Appreciate Dr. Charlies Silvers for admitting the patient to Obs.      Montine Circle, PA-C 04/24/15 1857  Tanna Furry, MD 04/29/15 1037

## 2015-04-24 NOTE — ED Notes (Signed)
MD placed orders for cardiac monitoring. Dr paged to verify order or change bed assignment.

## 2015-04-24 NOTE — ED Notes (Signed)
Bed: WA17 Expected date: 04/24/15 Expected time: 2:44 PM Means of arrival: Ambulance Comments: [facility]

## 2015-04-24 NOTE — ED Notes (Signed)
Pt ambulated to BR and back to room w/o assistance 

## 2015-04-24 NOTE — H&P (Signed)
[facility] History and Physical  Michele Owens [id number] DOB: 1961-04-08 DOA: 04/24/2015  Referring physician: ER PA: Montine Circle  PCP: Barbette Merino, MD  Chief Complaint: shortness of breath   HPI:  55 year old female with past medical history of COPD on home oxygen at night, OSA, HTN, dyslipidemia, diabetes (on metformin) who presented to [facility] ED with worsening shortness of breath on exertion and at rest started over past 1-2 days prior to this admission. No associated fevers. She does have chronic ongoing cough, intermittently productive of clear sputum. No chest pain. She used Spiriva and albuterol inhaler with no symptomatic relief. No abdominal pain, no nausea or vomiting. No diarrhea or constipation. No blood in stool or urine. No sick contacts.   In ED, pt was hemodynamically stable. Blood work showed WBC count of 19, hemoglobin 10 otherwise unremarkable. CXR showed mild CHF and CT head showed no acute intracranial findings. BNP was 111. She was given multiple nebs in ED and solumedrol 125 mg IV once but continued to feel short of breath, wheezing and require 4L [place] to keep O2 sats above 90%.   Assessment & Plan    Principal Problem:   Acute respiratory failure with hypoxia (HCC) / COPD exacerbation (HCC) - No acute cardiopulmonary findings on CXR - COPD order set placed - Started duoneb every 6 hours sch and as needed - Given 125 mg solumedrol in ED and will start 60 mg IV every 12 hours - Use oxygen support via [place] to keep O2 sats above 90%  Active Problems:   Smoking - Counseled on cessation - Continue nicotine patch    OSA (obstructive sleep apnea) - CPAP at bedtime    Normocytic anemia - Hgb stable at 10.7    Dyslipidemia associated with type 2 diabetes mellitus (HCC) - Continue Pravachol    Essential hypertension - Continue metoprolol     Leukocytosis - Unclear etiology - Repeat CBC - No acute cardiopulmonary process identified on CXR     Controlled type 2 diabetes mellitus without complication, without long-term current use of insulin (HCC) - Last A1c in 11/2014 less than 7 - Continue metformin    Chronic diastolic CHF (congestive heart failure) (HCC) - 2 D ECHO in 11/2014 with EF 55% and grade 2 diastolic dysfunction - Compensated    Anxiety - May resume clonazepam per home regimen    DVT prophylaxis:  - SCD's bilaterally   Radiological Exams on Admission: Dg Chest 2 View  04/24/2015  CLINICAL DATA:  Shortness of breath, BILATERAL wheezing, history hypertension, COPD, asthma, chronic bronchitis, smoking EXAM: CHEST  2 VIEW COMPARISON:  12/07/2014 FINDINGS: Enlargement of cardiac silhouette with pulmonary vascular congestion. Mediastinal contours normal. Perihilar and basilar infiltrates likely pulmonary edema. No gross pleural effusion or pneumothorax. Scattered endplate spur formation thoracic spine. IMPRESSION: Mild CHF, little changed versus prior exam. Electronically Signed   By: Lavonia Dana M.D.   On: 04/24/2015 16:34   Ct Head Wo Contrast  04/24/2015  CLINICAL DATA:  Acute onset slurred speech EXAM: CT HEAD WITHOUT CONTRAST TECHNIQUE: Contiguous axial images were obtained from the base of the skull through the vertex without intravenous contrast. COMPARISON:  February 17, 2013 FINDINGS: The ventricles are normal in size and configuration. There is no intracranial mass, hemorrhage, extra-axial fluid collection, or midline shift. Gray-white compartments appear normal. No acute infarct evident. The bony calvarium appears intact. The mastoid air cells are clear. No intraorbital lesions are identified. IMPRESSION: Study within normal limits. Electronically Signed  By: Lowella Grip III M.D.   On: 04/24/2015 16:07     Code Status: Full Family Communication: Plan of care discussed with the patient  Disposition Plan: Admit for further evaluation, medical floor   Leisa Lenz, MD  [facility] Pager  [phone]  Time spent in minutes: 75 minutes  Review of Systems:  Constitutional: Negative for fever, chills and malaise/fatigue. Negative for diaphoresis.  HENT: Negative for hearing loss, ear pain, nosebleeds, congestion, sore throat, neck pain, tinnitus and ear discharge.   Eyes: Negative for blurred vision, double vision, photophobia, pain, discharge and redness.  Respiratory: per HPI Cardiovascular: Negative for chest pain, palpitations, orthopnea, claudication and leg swelling.  Gastrointestinal: Negative for nausea, vomiting and abdominal pain. Negative for heartburn, constipation, blood in stool and melena.  Genitourinary: Negative for dysuria, urgency, frequency, hematuria and flank pain.  Musculoskeletal: Negative for myalgias, back pain, joint pain and falls.  Skin: Negative for itching and rash.  Neurological: Negative for dizziness and weakness. Negative for tingling, tremors, sensory change, speech change, focal weakness, loss of consciousness and headaches.  Endo/Heme/Allergies: Negative for environmental allergies and polydipsia. Does not bruise/bleed easily.  Psychiatric/Behavioral: Negative for suicidal ideas. The patient is not nervous/anxious.      Past Medical History  Diagnosis Date  . Hypertension   . Pain in joint, pelvic region and thigh     back and hip pain  . HLD (hyperlipidemia)   . Tobacco abuse   . Obesity   . Ovarian cyst   . GERD (gastroesophageal reflux disease)   . 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  Marland Kitchen Hx of echocardiogram     a. Echo 4/13:   EF 55-60%.  . Depression   . Hx of cardiovascular stress test     a. ETT-MV 03/2012:   EF 60%, no ischemia    . Asthma   . Chronic bronchitis ([place])   . OSA (obstructive sleep apnea)     a. sleep study 1/14:  mod OSA with API of 29/hr and O2 sat nadir of 80%  . Dysrhythmia   . Migraine     "2-3 days out of a week" (12/07/2014)   . Arthritis     "LUE; LLE" (12/07/2014)  . Frequent urination    Past Surgical History  Procedure Laterality Date  . Bunionectomy Bilateral   . Tonsillectomy    . Total abdominal hysterectomy  07/14/10   Social History:  reports that she has been smoking Cigarettes.  She has a 41 pack-year smoking history. She has never used smokeless tobacco. She reports that she drinks alcohol. She reports that she uses illicit drugs (Marijuana).  Allergies  Allergen Reactions  . Lamisil [Terbinafine Hcl]     Pt states causes her to feel funny and rash.  . Ibuprofen Rash    Severe rash    Family History:  Family History  Problem Relation Age of Onset  . Cancer Father      Prior to Admission medications   Medication Sig Start Date End Date Taking? Authorizing Provider  albuterol (PROVENTIL HFA;VENTOLIN HFA) 108 (90 BASE) MCG/ACT inhaler Inhale 1-2 puffs into the lungs every 6 (six) hours as needed for wheezing. 04/09/12  Yes Billy Fischer, MD  clonazePAM (KLONOPIN) 2 MG tablet Take 2 mg by mouth daily. 11/20/14  Yes Historical Provider, MD  COLCHICINE PO Take 0.6 mg by mouth 2 (two) times daily.  Yes Historical Provider, MD  furosemide (LASIX) 20 MG tablet Take 2 tablets (40 mg total) by mouth daily. 12/11/14  Yes [name], MD  HYDROcodone-acetaminophen (NORCO/VICODIN) 5-325 MG per tablet Take 1 tablet by mouth 2 (two) times daily as needed for moderate pain.   Yes Historical Provider, MD  loratadine (CLARITIN) 10 MG tablet Take 1 tab po qd 02/24/15  Yes Historical Provider, MD  metFORMIN (GLUCOPHAGE) 500 MG tablet Take 1 tablet (500 mg total) by mouth 2 (two) times daily with a meal. 12/11/14  Yes [name], MD  metoprolol succinate (TOPROL-XL) 50 MG 24 hr tablet Take 1 tablet (50 mg total) by mouth daily. 03/19/12  Yes Scott T Kathlen Mody, PA-C  mirtazapine (REMERON) 15 MG tablet Take 15 mg by mouth at bedtime. 11/16/14  Yes Historical Provider, MD  naproxen (NAPROSYN) 500 MG tablet  Take 500 mg by mouth 2 (two) times daily. 03/09/15  Yes Historical Provider, MD  nicotine (NICODERM CQ - DOSED IN MG/24 HOURS) 21 mg/24hr patch apply 1 patch to clean dry skin every 24 hours 12/22/14  Yes Historical Provider, MD  omeprazole (PRILOSEC) 40 MG capsule Take 40 mg by mouth daily. 04/13/15  Yes Historical Provider, MD  potassium chloride SA (K-DUR,KLOR-CON) 20 MEQ tablet Take 1 tablet (20 mEq total) by mouth daily. 12/11/14  Yes [name], MD  pravastatin (PRAVACHOL) 20 MG tablet Take 1 tablet (20 mg total) by mouth daily. Patient taking differently: Take 40 mg by mouth daily.  07/22/12  Yes [name], MD  pravastatin (PRAVACHOL) 40 MG tablet Take 40 mg by mouth daily.  03/29/15  Yes Historical Provider, MD  SPIRIVA HANDIHALER 18 MCG inhalation capsule Place 1 capsule into inhaler and inhale daily.  03/12/15  Yes Historical Provider, MD  ibuprofen (ADVIL,MOTRIN) 800 MG tablet Take 800 mg by mouth 2 (two) times daily as needed. for pain 02/10/15   Historical Provider, MD  traMADol (ULTRAM) 50 MG tablet TAKE 1-2 TABLETS EVERY 8 HOURS AS NEEDED FOR PAIN 02/01/15   Historical Provider, MD  zolpidem (AMBIEN) 10 MG tablet Take 10 mg by mouth at bedtime as needed for sleep.    Historical Provider, MD   Physical Exam: Filed Vitals:   04/24/15 1506 04/24/15 1510 04/24/15 1700 04/24/15 1827  BP:   143/81 138/70  Pulse:   89 103  Temp:      TempSrc:      Resp:   24 22  SpO2: 91% 93% 91% 92%    Physical Exam  Constitutional: Appears well-developed and well-nourished. No distress.  HENT: Normocephalic. No tonsillar erythema or exudates Eyes: Conjunctivae are normal. No scleral icterus.  Neck: Normal ROM. Neck supple. No JVD. No tracheal deviation. No thyromegaly.  CVS: RRR, S1/S2 +, no murmurs, no gallops, no carotid bruit.  Pulmonary: diminished, wheezing in upper lung lobes.  Abdominal: Soft. BS +,  no distension, tenderness, rebound or guarding.  Musculoskeletal: Normal range  of motion. No edema and no tenderness.  Lymphadenopathy: No lymphadenopathy noted, cervical, inguinal. Neuro: Alert. Normal reflexes, muscle tone coordination. No focal neurologic deficits. Skin: Skin is warm and dry. No rash noted.  No erythema. No pallor.  Psychiatric: Normal mood and affect. Behavior, judgment, thought content normal.   Labs on Admission:  Basic Metabolic Panel:  Recent Labs Lab 04/24/15 1557  NA 141  K 4.6  CL 100*  CO2 34*  GLUCOSE 122*  BUN 10  CREATININE 0.47  CALCIUM 9.0   Liver Function  Tests: No results for input(s): AST, ALT, ALKPHOS, BILITOT, PROT, ALBUMIN in the last 168 hours. No results for input(s): LIPASE, AMYLASE in the last 168 hours. No results for input(s): AMMONIA in the last 168 hours. CBC:  Recent Labs Lab 04/24/15 1557  WBC 19.0*  NEUTROABS 15.4*  HGB 10.7*  HCT 36.4  MCV 84.1  PLT 311   Cardiac Enzymes:  Recent Labs Lab 04/24/15 1557  TROPONINI <0.03   BNP: Invalid input(s): POCBNP CBG: No results for input(s): GLUCAP in the last 168 hours.  If 7PM-7AM, please contact night-coverage [email or web address] Password [email or web address] 04/24/2015, 7:11 PM

## 2015-04-24 NOTE — ED Notes (Signed)
Pt from home via EMS c/o SOB. Per EMS- has hx of COPD. Pt was found to be 70% on RA. Pt received 10 mg Albuterol/0.5 Atrovent en route. Pt was found to be 92% during neb tx. Pt has bilateral wheezing. Pt is A&O and ambulatory.

## 2015-04-25 LAB — COMPREHENSIVE METABOLIC PANEL
ALBUMIN: 3.8 g/dL (ref 3.5–5.0)
ALK PHOS: 82 U/L (ref 38–126)
ALT: 24 U/L (ref 14–54)
ANION GAP: 11 (ref 5–15)
AST: 18 U/L (ref 15–41)
BUN: 11 mg/dL (ref 6–20)
CALCIUM: 9.4 mg/dL (ref 8.9–10.3)
CO2: 34 mmol/L — AB (ref 22–32)
Chloride: 98 mmol/L — ABNORMAL LOW (ref 101–111)
Creatinine, Ser: 0.51 mg/dL (ref 0.44–1.00)
GFR calc Af Amer: >60 mL/min (ref >60)
GFR calc non Af Amer: >60 mL/min (ref >60)
GLUCOSE: 154 mg/dL — AB (ref 65–99)
Potassium: 5.2 mmol/L — ABNORMAL HIGH (ref 3.5–5.1)
SODIUM: 143 mmol/L (ref 135–145)
Total Bilirubin: 0.7 mg/dL (ref 0.3–1.2)
Total Protein: 7.6 g/dL (ref 6.5–8.1)

## 2015-04-25 LAB — CBC
HEMATOCRIT: 37.5 % (ref 36.0–46.0)
HEMOGLOBIN: 10.4 g/dL — AB (ref 12.0–15.0)
MCH: 24.5 pg — AB (ref 26.0–34.0)
MCHC: 27.7 g/dL — AB (ref 30.0–36.0)
MCV: 88.2 fL (ref 78.0–100.0)
Platelets: 351 10*3/uL (ref 150–400)
RBC: 4.25 MIL/uL (ref 3.87–5.11)
RDW: 17.6 % — ABNORMAL HIGH (ref 11.5–15.5)
WBC: 23.8 10*3/uL — ABNORMAL HIGH (ref 4.0–10.5)

## 2015-04-25 MED ORDER — GUAIFENESIN 100 MG/5ML PO SOLN
10.0000 mL | ORAL | Status: DC | PRN
  Administered 2015-04-25 – 2015-04-26 (×2): 200 mg via ORAL
  Filled 2015-04-25 (×2): qty 10

## 2015-04-25 MED ORDER — CLONAZEPAM 1 MG PO TABS
2.0000 mg | ORAL_TABLET | Freq: Two times a day (BID) | ORAL | Status: DC | PRN
  Administered 2015-04-25: 2 mg via ORAL
  Filled 2015-04-25: qty 2

## 2015-04-25 MED ORDER — IPRATROPIUM-ALBUTEROL 0.5-2.5 (3) MG/3ML IN SOLN
3.0000 mL | Freq: Three times a day (TID) | RESPIRATORY_TRACT | Status: DC
  Administered 2015-04-25 – 2015-04-27 (×6): 3 mL via RESPIRATORY_TRACT
  Filled 2015-04-25 (×6): qty 3

## 2015-04-25 NOTE — Evaluation (Addendum)
Physical Therapy Evaluation Patient Details Name: Michele Owens MRN: [id number] DOB: 08-28-1960 Today's Date: 04/25/2015   History of Present Illness  54 yo female admitted with acute respiratory failure. hx of COPD, HTN, obesity, SVT, asthma.   Clinical Impression  On eval, pt required Min assist for mobility-walked ~15'x2 in room. Pt is unsteady. LOB x3 during session. Decreased safety awareness. Pt noncompliant with most of therapist's instructions recommendations. O2 sats 75% on RA during session (pt repeatedly removed [place] O2). Replaced [place] at end of session 90% on 4L. Recommend pt resume home health aide services and home safety evaluation.     Follow Up Recommendations 24 hour supervision/assist; Home safety evaluation    Equipment Recommendations  None recommended by PT    Recommendations for Other Services       Precautions / Restrictions Precautions Precautions: Fall Precaution Comments: monitor O2 sats Restrictions Weight Bearing Restrictions: No      Mobility  Bed Mobility Overal bed mobility: Modified Independent                Transfers Overall transfer level: Needs assistance Equipment used: Rolling walker (2 wheeled) Transfers: Sit to/from Stand Sit to Stand: Min guard         General transfer comment: assist to stabilize. Pt is unsteady and refuses to follow therapist's instructions at times.  Ambulation/Gait Ambulation/Gait assistance: Min assist Ambulation Distance (Feet): 15 Feet (x2) Assistive device: Rolling walker (2 wheeled);None Gait Pattern/deviations: Step-through pattern;Decreased stride length     General Gait Details: assist to stabilize. Pt is unsteady. LOb multiple times while OOB walking to/from bathroom. highly encouraged RW use-pt used RW while walking to bathroom but refused to use it while walking back to chair  Stairs            Wheelchair Mobility    Modified Rankin (Stroke Patients Only)       Balance  Overall balance assessment: Needs assistance         Standing balance support: During functional activity Standing balance-Leahy Scale: Fair                               Pertinent Vitals/Pain Pain Assessment: No/denies pain    Home Living Family/patient expects to be discharged to:: Private residence Living Arrangements: Alone Available Help at Discharge: Personal care attendant Type of Home: [place]: One level Home Equipment: Walker - 2 wheels;Cane - single point;Bedside commode;Shower seat - built in      Prior Function Level of Independence: Independent with assistive device(s)         Comments: uses cane PRN     Hand Dominance        Extremity/Trunk Assessment   Upper Extremity Assessment: Overall WFL for tasks assessed           Lower Extremity Assessment: Generalized weakness      Cervical / Trunk Assessment: Normal  Communication   Communication: No difficulties  Cognition Arousal/Alertness: Awake/alert Behavior During Therapy: WFL for tasks assessed/performed   Area of Impairment: Safety/judgement         Safety/Judgement: Decreased awareness of safety          General Comments      Exercises        Assessment/Plan    PT Assessment Patient needs continued PT services  PT Diagnosis Difficulty walking;Generalized weakness   PT Problem List Decreased strength;Decreased activity tolerance;Decreased balance;Decreased  mobility;Decreased safety awareness  PT Treatment Interventions Gait training;DME instruction;Functional mobility training;Therapeutic activities;Patient/family education;Balance training;Therapeutic exercise   PT Goals (Current goals can be found in the Care Plan section) Acute Rehab PT Goals Patient Stated Goal: none stated PT Goal Formulation: With patient Time For Goal Achievement: 05/09/15 Potential to Achieve Goals: Fair    Frequency Min 3X/week   Barriers to discharge         Co-evaluation               End of Session Equipment Utilized During Treatment: Oxygen Activity Tolerance: Patient tolerated treatment well Patient left: in chair;with call bell/phone within reach;with chair alarm set;with family/visitor present           Time: 1430-1515 PT Time Calculation (min) (ACUTE ONLY): 45 min   Charges:   PT Evaluation $PT Eval Moderate Complexity: 1 Procedure PT Treatments $Gait Training: 8-22 mins $Therapeutic Activity: 8-22 mins   PT G Codes:        Weston Anna, MPT Pager: [phone]

## 2015-04-25 NOTE — Progress Notes (Signed)
Patient ID: Michele Owens, female   DOB: 1961/03/14, 55 y.o.   MRN: [id number] [facility] PROGRESS NOTE  Heloise Schoenwetter [id number] DOB: 1960-08-17 DOA: 04/24/2015 PCP: Barbette Merino, MD  Brief narrative:    55 year old female with past medical history of COPD on home oxygen at night, OSA, HTN, dyslipidemia, diabetes (on metformin) who presented to [facility] ED with worsening shortness of breath on exertion and at rest started over past 1-2 days prior to this admission. No associated fevers. She does have chronic ongoing cough, intermittently productive of clear sputum. Inhalers and nebulizer at home did not provide significant symptomatic relief.  In ED, pt was hemodynamically stable. Blood work showed WBC count of 19, hemoglobin 10 otherwise unremarkable. CXR showed mild CHF and CT head showed no acute intracranial findings. BNP was 111. She was given multiple nebs in ED and solumedrol 125 mg IV once but continued to feel short of breath, wheezing and require 4L [place] to keep O2 sats above 90%.    Assessment/Plan:    Principal Problem:  Acute respiratory failure with hypoxia (HCC) / COPD exacerbation (HCC) - Hypoxia likely secondary to COPD - No acute cardiopulmonary findings on CXR - Continue current nebulizer treatments - Continue Solu-Medrol 60 mg IV every 12 hours - Continue oxygen support via [place] to keep O2 sats above 90%  Active Problems:  Smoking - Counseled on cessation - Continue nicotine patch   OSA (obstructive sleep apnea) - CPAP at bedtime   Normocytic anemia - Hgb stable at 10.7, 10.4   Dyslipidemia associated with type 2 diabetes mellitus (HCC) - Continue Pravachol   Essential hypertension - Continue metoprolol  - BP 133/63   Leukocytosis - Unclear etiology although it could be due to steroids  - No acute cardiopulmonary process identified on CXR   Controlled type 2 diabetes mellitus without complication, without long-term current use of insulin  (HCC) - Last A1c in 11/2014 less than 7 - Continue metformin   Chronic diastolic CHF (congestive heart failure) ([place]) - 2 D ECHO in 11/2014 with EF 55% and grade 2 diastolic dysfunction - Compensated   Anxiety - Clonazepam per home regimen    DVT Prophylaxis  - SCDs bilaterally   Code Status: Full.  Family Communication:  plan of care discussed with the patient Disposition Plan: Home likely 04/26/2015   IV access:  Peripheral IV  Procedures and diagnostic studies:    Dg Chest 2 View 04/24/2015  Mild CHF, little changed versus prior exam. Electronically Signed   By: Lavonia Dana M.D.   On: 04/24/2015 16:34   Ct Head Wo Contrast 04/24/2015  Study within normal limits. Electronically Signed   By: Lowella Grip III M.D.   On: 04/24/2015 16:07   Medical Consultants:  None  Other Consultants:  None   IAnti-Infectives:   None    Leisa Lenz, MD  [facility] Pager [phone]  Time spent in minutes: 25 minutes  If 7PM-7AM, please contact night-coverage [email or web address] Password [facility] 04/25/2015, 11:50 AM   LOS: 1 day    HPI/Subjective: No acute overnight events. Patient sleeping this morning. Had complaints of headaches.  Objective: Filed Vitals:   04/24/15 1830 04/24/15 2214 04/25/15 0234 04/25/15 0534  BP: 140/75 146/64  133/63  Pulse:  81  85  Temp:  97.8 F (36.6 C)  97.6 F (36.4 C)  TempSrc:  Oral  Oral  Resp: 18 20  20   Height:  5\' 5"  (1.651 m)    Weight:  292 lb  8.8 oz (132.7 kg)    SpO2:  92% 92% 90%    Intake/Output Summary (Last 24 hours) at 04/25/15 1150 Last data filed at 04/25/15 [id number]  Gross per 24 hour  Intake    400 ml  Output    850 ml  Net   -450 ml    Exam:   General:  Pt is sleeping, wakes up when called her name  Cardiovascular: Regular rate and rhythm, S1/S2, no murmurs  Respiratory: Diminished but no wheezing, no rhonchi  Abdomen: Soft, non tender, non distended, bowel sounds present  Extremities: No edema,  pulses DP and PT palpable bilaterally  Neuro: Grossly nonfocal  Data Reviewed: Basic Metabolic Panel:  Recent Labs Lab 04/24/15 1557 04/25/15 0540  NA 141 143  K 4.6 5.2*  CL 100* 98*  CO2 34* 34*  GLUCOSE 122* 154*  BUN 10 11  CREATININE 0.47 0.51  CALCIUM 9.0 9.4   Liver Function Tests:  Recent Labs Lab 04/25/15 0540  AST 18  ALT 24  ALKPHOS 82  BILITOT 0.7  PROT 7.6  ALBUMIN 3.8   No results for input(s): LIPASE, AMYLASE in the last 168 hours. No results for input(s): AMMONIA in the last 168 hours. CBC:  Recent Labs Lab 04/24/15 1557 04/25/15 0540  WBC 19.0* 23.8*  NEUTROABS 15.4*  --   HGB 10.7* 10.4*  HCT 36.4 37.5  MCV 84.1 88.2  PLT 311 351   Cardiac Enzymes:  Recent Labs Lab 04/24/15 1557  TROPONINI <0.03   BNP: Invalid input(s): POCBNP CBG: No results for input(s): GLUCAP in the last 168 hours.  No results found for this or any previous visit (from the past 240 hour(s)).   Scheduled Meds: . antiseptic oral rinse  7 mL Mouth Rinse BID  . clonazePAM  2 mg Oral Daily  . colchicine  0.6 mg Oral BID  . furosemide  40 mg Oral Daily  . ipratropium-albuterol  3 mL Nebulization TID  . loratadine  10 mg Oral Daily  . metFORMIN  500 mg Oral BID WC  . methylPREDNISolone (SOLU-MEDROL) injection  60 mg Intravenous Q12H  . metoprolol succinate  50 mg Oral Daily  . mirtazapine  15 mg Oral QHS  . nicotine  14 mg Transdermal Daily  . pantoprazole  40 mg Oral Daily  . potassium chloride SA  20 mEq Oral Daily  . pravastatin  40 mg Oral Daily   Continuous Infusions: . sodium chloride

## 2015-04-25 NOTE — Progress Notes (Signed)
Utilization review completed.  

## 2015-04-26 ENCOUNTER — Encounter (HOSPITAL_COMMUNITY): Payer: Self-pay | Admitting: General Practice

## 2015-04-26 LAB — CBC
HEMATOCRIT: 37.4 % (ref 36.0–46.0)
Hemoglobin: 10.3 g/dL — ABNORMAL LOW (ref 12.0–15.0)
MCH: 24.8 pg — ABNORMAL LOW (ref 26.0–34.0)
MCHC: 27.5 g/dL — ABNORMAL LOW (ref 30.0–36.0)
MCV: 89.9 fL (ref 78.0–100.0)
Platelets: 352 10*3/uL (ref 150–400)
RBC: 4.16 MIL/uL (ref 3.87–5.11)
RDW: 17.5 % — AB (ref 11.5–15.5)
WBC: 24.1 10*3/uL — AB (ref 4.0–10.5)

## 2015-04-26 LAB — GLUCOSE, CAPILLARY: Glucose-Capillary: 133 mg/dL — ABNORMAL HIGH (ref 65–99)

## 2015-04-26 MED ORDER — PREDNISONE 50 MG PO TABS
50.0000 mg | ORAL_TABLET | Freq: Every day | ORAL | Status: DC
  Administered 2015-04-27: 50 mg via ORAL
  Filled 2015-04-26 (×2): qty 1

## 2015-04-26 MED ORDER — HYDROCODONE-ACETAMINOPHEN 5-325 MG PO TABS
1.0000 | ORAL_TABLET | Freq: Four times a day (QID) | ORAL | Status: DC | PRN
  Administered 2015-04-26: 1 via ORAL
  Filled 2015-04-26: qty 1

## 2015-04-26 NOTE — Care Management Note (Signed)
Case Management Note  Patient Details  Name: Michele Owens MRN: [id number] Date of Birth: May 05, 1960  Subjective/Objective:  [facility] chosen for [facility] safety eval, & home social worker-resources.  If home 02 needed please document 02 sats, & put in home 02 order.                  Action/Plan:d/c plan home w/HHC.   Expected Discharge Date:                  Expected Discharge Plan:  [place]  In-House Referral:     Discharge planning Services  CM Consult  Post Acute Care Choice:  Durable Medical Equipment ([employer] home 02  HS) Choice offered to:  Patient  DME Arranged:    DME Agency:     HH Arranged:  RN, Social Work [employer] Agency:  [place]  Status of Service:  In process, will continue to follow  Medicare Important Message Given:    Date Medicare IM Given:    Medicare IM give by:    Date Additional Medicare IM Given:    Additional Medicare Important Message give by:     If discussed at [employer] of Stay Meetings, dates discussed:    Additional Comments:  [name], RN 04/26/2015, 3:40 PM

## 2015-04-26 NOTE — Progress Notes (Signed)
Patient complains of left upper chest pain 10/10, tender to touch, does not radiate anywhere. VSS, EKG shows NSR. Dr Charlies Silvers made aware. Will continue to monitor. Michele Owens A

## 2015-04-26 NOTE — Progress Notes (Signed)
SATURATION QUALIFICATIONS: (This note is used to comply with regulatory documentation for home oxygen)  Patient Saturations on Room Air at Rest = 78%  Patient Saturations on 4 Liters of oxygen while Ambulating = 89%

## 2015-04-26 NOTE — Progress Notes (Signed)
Nutrition Brief Note  Consult received for assessment of nutrition requirements/status.  Wt Readings from Last 15 Encounters:  04/26/15 296 lb 15.4 oz (134.7 kg)  12/11/14 297 lb 3.2 oz (134.809 kg)  08/27/14 286 lb 14.4 oz (130.137 kg)  04/17/14 280 lb (127.007 kg)  05/30/13 255 lb 9.6 oz (115.939 kg)  04/07/13 251 lb (113.853 kg)  03/13/13 256 lb 3.2 oz (116.212 kg)  02/17/13 255 lb (115.667 kg)  05/10/12 255 lb (115.667 kg)  04/11/12 249 lb (112.946 kg)  03/26/12 249 lb (112.946 kg)  03/19/12 249 lb 1.9 oz (113 kg)  07/03/11 236 lb (107.049 kg)  06/14/10 232 lb (105.235 kg)    Body mass index is 49.42 kg/(m^2). Patient meets criteria for morbid obesity based on current BMI.   Pt briefly awoke for RD but did not interact other than to state "yes" when her name was called and to indicate that she consumed breakfast this AM. Per chart review, pt has been gaining weight over the past 1 year.  Current diet order is Heart Healthy, patient is consuming approximately 100% of meals at this time. Labs (K: 5.2 mmol/L) and medications reviewed.   No nutrition interventions warranted at this time. If nutrition issues arise, please consult RD.     Jarome Matin, RD, LDN Inpatient Clinical Dietitian Pager # [phone] After hours/weekend pager # [phone]

## 2015-04-26 NOTE — Care Management Note (Signed)
Case Management Note  Patient Details  Name: Wendy Casteneda MRN: [id number] Date of Birth: [birth year]  Subjective/Objective: 55 y/o f admitted w/acute resp failure w/hypoxia. From home. Asked patient if she uses home 02-she said yes, but it took calling 3 dme agencies to confirm which DME company she uses-[employer] rep Rodena Piety aware-will need 02 sats documented, & home 02 order w/route/liter flow/freq-to send to [place] for delivery of home 02 if qualifies to hospital prior d/c.                   Action/Plan:d/c plan home.   Expected Discharge Date:                  Expected Discharge Plan:  Home/Self Care  In-House Referral:     Discharge planning Services  CM Consult  Post Acute Care Choice:  Durable Medical Equipment ([employer] home 02  HS) Choice offered to:     DME Arranged:    DME Agency:     HH Arranged:    [place] Agency:     Status of Service:  In process, will continue to follow  Medicare Important Message Given:    Date Medicare IM Given:    Medicare IM give by:    Date Additional Medicare IM Given:    Additional Medicare Important Message give by:     If discussed at [place] of Stay Meetings, dates discussed:    Additional Comments:  [name], RN 04/26/2015, 1:38 PM

## 2015-04-26 NOTE — Progress Notes (Signed)
Patient ID: Michele Owens, female   DOB: [birth year], 55 y.o.   MRN: [id number] [facility] PROGRESS NOTE  Michele Owens [id number] DOB: 01-20-1961 DOA: 04/24/2015 PCP: Barbette Merino, MD  Brief narrative:    55 year old female with past medical history of COPD on home oxygen at night, OSA, HTN, dyslipidemia, diabetes (on metformin) who presented to [facility] ED with worsening shortness of breath on exertion and at rest started over past 1-2 days prior to this admission. No associated fevers. She does have chronic ongoing cough, intermittently productive of clear sputum. Inhalers and nebulizers at home did not provide significant symptomatic relief.  In ED, pt was hemodynamically stable. Blood work showed WBC count of 19, hemoglobin 10 otherwise unremarkable. CXR showed mild CHF and CT head showed no acute intracranial findings. BNP was 111. She was given multiple nebs in ED and solumedrol 125 mg IV once but continued to feel short of breath, wheezing and require 4L [place] to keep O2 sats above 90%.    Assessment/Plan:    Principal Problem:  Acute respiratory failure with hypoxia (HCC) / COPD exacerbation (HCC) - Hypoxia likely secondary to COPD - No acute cardiopulmonary findings on CXR - Start prednisone 50 mg daily  - Continue nebs - Continue oxygen support via [place] to keep O2 sats above 90%  Active Problems:  Smoking - Counseled on cessation - Continue nicotine patch   OSA (obstructive sleep apnea) - CPAP at bedtime   Normocytic anemia - Hgb stable at 10.7, 10.4   Dyslipidemia associated with type 2 diabetes mellitus (HCC) - Continue Pravachol   Essential hypertension - Continue metoprolol  - Blood pressure control   Leukocytosis - Unclear etiology although it could be due to steroids  - No acute cardiopulmonary process identified on CXR - Check CBC today    Controlled type 2 diabetes mellitus without complication, without long-term current use of insulin (HCC) -  Last A1c in 11/2014 less than 7 - Continue metformin   Chronic diastolic CHF (congestive heart failure) ([place]) - 2 D ECHO in 11/2014 with EF 55% and grade 2 diastolic dysfunction - Compensated   Anxiety - Clonazepam per home regimen    DVT Prophylaxis  - SCDs bilaterally in hospital   Code Status: Full.  Family Communication:  plan of care discussed with the patient Disposition Plan: Home likely tomorrow 04/27/2015  IV access:  Peripheral IV  Procedures and diagnostic studies:    Dg Chest 2 View 04/24/2015  Mild CHF, little changed versus prior exam. Electronically Signed   By: Lavonia Dana M.D.   On: 04/24/2015 16:34   Ct Head Wo Contrast 04/24/2015  Study within normal limits. Electronically Signed   By: Lowella Grip III M.D.   On: 04/24/2015 16:07   Medical Consultants:  None  Other Consultants:  None   IAnti-Infectives:   None    Leisa Lenz, MD  [facility] Pager [phone]  Time spent in minutes: 25 minutes  If 7PM-7AM, please contact night-coverage [email or web address] Password [email or web address] 04/26/2015, 10:18 AM   LOS: 2 days    HPI/Subjective: No acute overnight events. Feels better.  Objective: Filed Vitals:   04/25/15 1930 04/25/15 2121 04/26/15 0615 04/26/15 0740  BP:  130/65 137/65   Pulse:  85 88   Temp:  98.2 F (36.8 C) 97.9 F (36.6 C)   TempSrc:  Oral Oral   Resp:  20 20   Height:      Weight:   296 lb 15.4 oz (134.7 kg)  SpO2: 95% 94% 90% 96%    Intake/Output Summary (Last 24 hours) at 04/26/15 1018 Last data filed at 04/26/15 0708  Gross per 24 hour  Intake   1560 ml  Output   3300 ml  Net  -1740 ml    Exam:   General:  Pt is alert, oriented  Cardiovascular: Rate controlled, appreciate S1-S2  Respiratory: Slight wheezing in upper lung lobes, no rhonchi  Abdomen: Appreciate bowel sounds, nontender abdomen  Extremities: No leg swelling, pulses palpable  Neuro: Nonfocal  Data Reviewed: Basic Metabolic  Panel:  Recent Labs Lab 04/24/15 1557 04/25/15 0540  NA 141 143  K 4.6 5.2*  CL 100* 98*  CO2 34* 34*  GLUCOSE 122* 154*  BUN 10 11  CREATININE 0.47 0.51  CALCIUM 9.0 9.4   Liver Function Tests:  Recent Labs Lab 04/25/15 0540  AST 18  ALT 24  ALKPHOS 82  BILITOT 0.7  PROT 7.6  ALBUMIN 3.8   No results for input(s): LIPASE, AMYLASE in the last 168 hours. No results for input(s): AMMONIA in the last 168 hours. CBC:  Recent Labs Lab 04/24/15 1557 04/25/15 0540  WBC 19.0* 23.8*  NEUTROABS 15.4*  --   HGB 10.7* 10.4*  HCT 36.4 37.5  MCV 84.1 88.2  PLT 311 351   Cardiac Enzymes:  Recent Labs Lab 04/24/15 1557  TROPONINI <0.03   BNP: Invalid input(s): POCBNP CBG:  Recent Labs Lab 04/26/15 0750  GLUCAP 133*    No results found for this or any previous visit (from the past 240 hour(s)).   Scheduled Meds: . antiseptic oral rinse  7 mL Mouth Rinse BID  . clonazePAM  2 mg Oral Daily  . colchicine  0.6 mg Oral BID  . furosemide  40 mg Oral Daily  . ipratropium-albuterol  3 mL Nebulization TID  . loratadine  10 mg Oral Daily  . metFORMIN  500 mg Oral BID WC  . methylPREDNISolone (SOLU-MEDROL) injection  60 mg Intravenous Q12H  . metoprolol succinate  50 mg Oral Daily  . mirtazapine  15 mg Oral QHS  . nicotine  14 mg Transdermal Daily  . pantoprazole  40 mg Oral Daily  . potassium chloride SA  20 mEq Oral Daily  . pravastatin  40 mg Oral Daily   Continuous Infusions: . sodium chloride

## 2015-04-26 NOTE — Progress Notes (Signed)
CSW received consult for COPD Gold Protocol, though patient does not meet qualifications (only 2 admissions within the past 6 months).   CSW reviewed chart - patient to return home at discharge.   CSW signing off.   Raynaldo Opitz, [facility] Clinical Social Worker cell #: [phone]

## 2015-04-26 NOTE — Evaluation (Signed)
Occupational Therapy Evaluation Patient Details Name: Michele Owens MRN: [id number] DOB: [birth year] Today's Date: 04/26/2015    History of Present Illness 55 yo female admitted with acute respiratory failure. hx of COPD, HTN, obesity, SVT, asthma.    Clinical Impression   Patient presenting with decreased ADL, decreased functional mobility independence, decreased cardiopulmonary support secondary to above. Patient mod I with mobility and required some assistance with functional transfers & ADLs PTA; aide assisting 5-6 times per week. Patient currently functioning at an overall min to mod assist level with functional mobility and ADLs. Patient will benefit from acute OT to increase overall independence in the areas of ADLs, functional mobility, and overall safety in order to safely discharge home with 24/7 supervision recommended due to decreased safety awareness using RW and new supplemental 02 all the time.   Pt 90% on 2L/min supplemental 02. Pt de-sat to 70's when on RA and during activity. Encouraged pt to keep supplemental 02 via Desha on at all times and discussed this with NT as well. Sats would quickly reach 90% with supplemental 02 and rest break.     Follow Up Recommendations  Supervision/Assistance - 24 hour (TBD, may need HHOT depending on progress)    Equipment Recommendations  Other (comment) (sock aid and LH sponge)    Recommendations for Other Services   None at this time   Precautions / Restrictions Precautions Precautions: Fall Precaution Comments: monitor O2 sats, dependent on supplemental 02 at this time Restrictions Weight Bearing Restrictions: No    Mobility Bed Mobility Overal bed mobility: Needs Assistance Bed Mobility: Sit to Supine Sit to supine: Supervision Transfers Overall transfer level: Needs assistance Equipment used: Rolling walker (2 wheeled) Transfers: Sit to/from Stand Sit to Stand: Min guard General transfer comment: Min guard to stabilize.  Cues for safety with RW, hand placement, and sequencing. Pt tended to push RW too far in front of her or to side of her dueing sit to/from stands.     Balance Overall balance assessment: Needs assistance Sitting-balance support: No upper extremity supported;Feet supported Sitting balance-Leahy Scale: Fair     Standing balance support: Bilateral upper extremity supported;During functional activity Standing balance-Leahy Scale: Fair    ADL Overall ADL's : Needs assistance/impaired Eating/Feeding: Set up;Bed level   Grooming: Min guard;Standing;Wash/dry hands Grooming Details (indicate cue type and reason): at sink Upper Body Bathing: Minimal assitance;Sitting   Lower Body Bathing: Moderate assistance;Sit to/from stand   Upper Body Dressing : Minimal assistance;Sitting   Lower Body Dressing: Maximal assistance;Sit to/from stand Lower Body Dressing Details (indicate cue type and reason): Pt will benefit from AE Toilet Transfer: Minimal assistance;Ambulation;RW;BSC   Toileting- Water quality scientist and Hygiene: Min guard;Sit to/from Nurse, children's Details (indicate cue type and reason): did not occur Functional mobility during ADLs: Minimal assistance;Cueing for safety;Cueing for sequencing;Rolling walker General ADL Comments: Pt with unsafe RW safety. Pt needs supplemental 02 during all mobility, including in room mobility due to de-sats on RA. Pt states she has a shower seat (seat vs bench?) and a 3-n-1. States her aide assists with ADLs and comes assist M-F for ~3 hours per day and prn on Saturdays    Vision Additional Comments: Pt lethargic during session with eyes shut majority of time          Pertinent Vitals/Pain Pain Assessment: No/denies pain     Hand Dominance Right   Extremity/Trunk Assessment Upper Extremity Assessment Upper Extremity Assessment: Overall WFL for tasks assessed   Lower  Extremity Assessment Lower Extremity Assessment: Defer to  PT evaluation   Cervical / Trunk Assessment Cervical / Trunk Assessment: Normal   Communication Communication Communication: No difficulties   Cognition Arousal/Alertness: Awake/alert Behavior During Therapy: WFL for tasks assessed/performed   Area of Impairment: Safety/judgement Safety/Judgement: Decreased awareness of safety              Home Living Family/patient expects to be discharged to:: Private residence Living Arrangements: Alone Available Help at Discharge: Personal care attendant Type of Home: [place]: One level     Bathroom Shower/Tub: Teacher, early years/pre: Standard     Home Equipment: Environmental consultant - 2 wheels;Cane - single point;Bedside commode;Adaptive equipment;Shower seat;Tub bench (seat vs bench??) Adaptive Equipment: Reacher  Aide assists with shower transfers, comes M-F ~3 hours and prn on Saturdays     Prior Functioning/Environment Level of Independence: Independent with assistive device(s);Needs assistance  Comments: uses cane PRN    OT Diagnosis: Generalized weakness   OT Problem List: Decreased strength;Decreased activity tolerance;Impaired balance (sitting and/or standing);Decreased safety awareness;Decreased knowledge of use of DME or AE;Cardiopulmonary status limiting activity;Decreased knowledge of precautions   OT Treatment/Interventions: Self-care/ADL training;Therapeutic exercise;Energy conservation;DME and/or AE instruction;Therapeutic activities;Patient/family education;Balance training    OT Goals(Current goals can be found in the care plan section) Acute Rehab OT Goals Patient Stated Goal: none stated OT Goal Formulation: With patient Time For Goal Achievement: 05/10/15 Potential to Achieve Goals: Good ADL Goals Pt Will Perform Grooming: with modified independence;standing Pt Will Perform Lower Body Bathing: with modified independence;sit to/from stand;with adaptive equipment Pt Will Perform Lower  Body Dressing: with modified independence;sit to/from stand;with adaptive equipment Pt Will Transfer to Toilet: with modified independence;ambulating;bedside commode Additional ADL Goal #1: Pt will be educated on energy conservation techniques and pt will able to verbalize at least 3 energy conservation strategies to increase overall independence and safety with ADLs   OT Frequency: Min 2X/week   Barriers to D/C: Decreased caregiver support    End of Session Equipment Utilized During Treatment: Gait belt;Rolling walker;Oxygen Nurse Communication: Mobility status;Other (comment) (urine output during OT session and pt in bed with bed alarm set)  Activity Tolerance: Patient limited by lethargy Patient left: in bed;with call bell/phone within reach;with bed alarm set;with SCD's reapplied   Time: [id number] OT Time Calculation (min): 19 min  Charges:  OT General Charges $OT Visit: 1 Procedure OT Evaluation $OT Eval Moderate Complexity: 1 Procedure  Chrys Racer , MS, OTR/L, CLT Pager: [phone]  04/26/2015, 10:55 AM

## 2015-04-27 LAB — BASIC METABOLIC PANEL
Anion gap: 9 (ref 5–15)
BUN: 17 mg/dL (ref 6–20)
CHLORIDE: 95 mmol/L — AB (ref 101–111)
CO2: 40 mmol/L — AB (ref 22–32)
CREATININE: 0.47 mg/dL (ref 0.44–1.00)
Calcium: 9 mg/dL (ref 8.9–10.3)
GFR calc Af Amer: >60 mL/min (ref >60)
GFR calc non Af Amer: >60 mL/min (ref >60)
GLUCOSE: 108 mg/dL — AB (ref 65–99)
POTASSIUM: 4.3 mmol/L (ref 3.5–5.1)
Sodium: 144 mmol/L (ref 135–145)

## 2015-04-27 MED ORDER — AZITHROMYCIN 1 G PO PACK: 1.0000 g | PACK | Freq: Once | ORAL | Status: DC

## 2015-04-27 MED ORDER — PREDNISONE 5 MG PO TABS: 50.0000 mg | ORAL_TABLET | Freq: Every day | ORAL | Status: DC

## 2015-04-27 NOTE — Discharge Instructions (Signed)
Respiratory failure is when your lungs are not working well and your breathing (respiratory) system fails. When respiratory failure occurs, it is difficult for your lungs to get enough oxygen, get rid of carbon dioxide, or both. Respiratory failure can be life threatening.  °Respiratory failure can be acute or chronic. Acute respiratory failure is sudden, severe, and requires emergency medical treatment. Chronic respiratory failure is less severe, happens over time, and requires ongoing treatment.  °WHAT ARE THE CAUSES OF ACUTE RESPIRATORY FAILURE?  °Any problem affecting the heart or lungs can cause acute respiratory failure. Some of these causes include the following: °· Chronic bronchitis and emphysema (COPD).   °· Blood clot going to a lung (pulmonary embolism).   °· Having water in the lungs caused by heart failure, lung injury, or infection (pulmonary edema).   °· Collapsed lung (pneumothorax).   °· Pneumonia.   °· Pulmonary fibrosis.   °· Obesity.   °· Asthma.   °· Heart failure.   °· Any type of trauma to the chest that can make breathing difficult.   °· Nerve or muscle diseases making chest movements difficult. °HOW WILL MY ACUTE RESPIRATORY FAILURE BE TREATED?  °Treatment of acute respiratory failure depends on the cause of the respiratory failure. Usually, you will stay in the intensive care unit so your breathing can be watched closely. Treatment can include the following: °· Oxygen. Oxygen can be delivered through the following: °¨ Nasal cannula. This is small tubing that goes in your nose to give you oxygen. °¨ Face mask. A face mask covers your nose and mouth to give you oxygen. °· Medicine. Different medicines can be given to help with breathing. These can include: °¨ Nebulizers. Nebulizers deliver medicines to open the air passages (bronchodilators). These medicines help to open or relax the airways in the lungs so you can breathe better. They can also help loosen mucus from your  lungs. °¨ Diuretics. Diuretic medicines can help you breathe better by getting rid of extra water in your body. °¨ Steroids. Steroid medicines can help decrease swelling (inflammation) in your lungs. °¨ Antibiotics. °· Chest tube. If you have a collapsed lung (pneumothorax), a chest tube is placed to help reinflate the lung. °· Noninvasive positive pressure ventilation (NPPV). This is a tight-fitting mask that goes over your nose and mouth. The mask has tubing that is attached to a machine. The machine blows air into the tubing, which helps to keep the tiny air sacs (alveoli) in your lungs open. This machine allows you to breathe on your own. °· Ventilator. A ventilator is a breathing machine. When on a ventilator, a breathing tube is put into the lungs. A ventilator is used when you can no longer breathe well enough on your own. You may have low oxygen levels or high carbon dioxide (CO2) levels in your blood. When you are on a ventilator, sedation and pain medicines are given to make you sleep so your lungs can heal. °SEEK IMMEDIATE MEDICAL CARE IF: °· You have shortness of breath (dyspnea) with or without activity. °· You have rapid breathing (tachypnea). °· You are wheezing. °· You are unable to say more than a few words without having to catch your breath. °· You find it very difficult to function normally. °· You have a fast heart rate. °· You have a bluish color to your finger or toe nail beds. °· You have confusion or drowsiness or both. °  °This information is not intended to replace advice given to you by your health care provider. Make sure you discuss   any questions you have with your health care provider. °  °Document Released: 04/01/2013 Document Revised: 12/16/2014 Document Reviewed: 04/01/2013 °Elsevier Interactive Patient Education ©2016 [employer] ° °

## 2015-04-27 NOTE — Discharge Summary (Signed)
Physician Discharge Summary  Michele Owens [id number] DOB: 1960/12/18 DOA: 04/24/2015  PCP: Barbette Merino, MD  Admit date: 04/24/2015 Discharge date: 04/27/2015  Recommendations for Outpatient Follow-up:  Taper down prednisone starting from 50 mg a day, taper down by 5 mg a day down to 0 mg. for ex, today 50 mg, tomorrow 45 mg, then 40 mg the following day and etc... Take z pack on discharge   Discharge Diagnoses:  Principal Problem:   Acute respiratory failure with hypoxia (Michele Owens) Active Problems:   Smoking   OSA (obstructive sleep apnea)   COPD exacerbation (HCC)   Leukocytosis   Dyslipidemia associated with type 2 diabetes mellitus (HCC)   Controlled type 2 diabetes mellitus without complication, without long-term current use of insulin (HCC)   Chronic diastolic CHF (congestive heart failure) (HCC)   Benign essential HTN   Normocytic anemia   Anxiety   COPD (chronic obstructive pulmonary disease) ([place])    Discharge Condition: stable   Diet recommendation: as tolerated   History of present illness:  55 year old female with past medical history of COPD on home oxygen at night, OSA, HTN, dyslipidemia, diabetes (on metformin) who presented to [facility] ED with worsening shortness of breath on exertion and at rest started over past 1-2 days prior to this admission. No associated fevers. She does have chronic ongoing cough, intermittently productive of clear sputum. Inhalers and nebulizers at home did not provide significant symptomatic relief.  In ED, pt was hemodynamically stable. Blood work showed WBC count of 19, hemoglobin 10 otherwise unremarkable. CXR showed mild CHF and CT head showed no acute intracranial findings. BNP was 111. She was given multiple nebs in ED and solumedrol 125 mg IV once but continued to feel short of breath, wheezing and require 4L Michele Owens to keep O2 sats above 90%.   Hospital Course:    Assessment/Plan:    Principal Problem:  Acute respiratory failure  with hypoxia (HCC) / COPD exacerbation (HCC) - Hypoxia likely secondary to COPD - No acute cardiopulmonary findings on CXR - We will taper prednisone as noted above - Continue nebs per home regimen  Active Problems:  Smoking - Counseled on cessation - Continue nicotine patch   OSA (obstructive sleep apnea) - CPAP at bedtime   Normocytic anemia - Hgb stable at 10.7, 10.4   Dyslipidemia associated with type 2 diabetes mellitus ([name]) - Continue Pravachol   Essential hypertension - Continue metoprolol    Leukocytosis - Unclear etiology although it could be due to steroids  - No acute cardiopulmonary process identified on CXR - Will be followed up on outpatient basis    Controlled type 2 diabetes mellitus without complication, without long-term current use of insulin (HCC) - Last A1c in 11/2014 less than 7 - Continue metformin on discharge   Chronic diastolic CHF (congestive heart failure) ([name]) - 2 D ECHO in 11/2014 with EF 55% and grade 2 diastolic dysfunction - Compensated   Anxiety - Clonazepam per home regimen    DVT Prophylaxis  - SCDs bilaterally in hospital   Code Status: Full.  Family Communication: plan of care discussed with the patient   IV access:  Peripheral IV  Procedures and diagnostic studies:   Dg Chest 2 View 04/24/2015 Mild CHF, little changed versus prior exam. Electronically Signed By: Lavonia Dana M.D. On: 04/24/2015 16:34   Ct Head Wo Contrast 04/24/2015 Study within normal limits. Electronically Signed By: Lowella Grip III M.D. On: 04/24/2015 16:07   Medical Consultants:  None  Other  Consultants:  None   IAnti-Infectives:   None  Signed:  Leisa Lenz, MD  [facility] 04/27/2015, 11:03 AM  Pager #: [phone]  Time spent in minutes: more than 30 minutes   Discharge Exam: Filed Vitals:   04/27/15 0201 04/27/15 0515  BP: 134/72 130/62  Pulse: 76 85  Temp: 98.3 F (36.8  C) 98.5 F (36.9 C)  Resp: 20 20   Filed Vitals:   04/26/15 2025 04/26/15 2120 04/27/15 0201 04/27/15 0515  BP: 134/62  134/72 130/62  Pulse: 81  76 85  Temp: 97.7 F (36.5 C)  98.3 F (36.8 C) 98.5 F (36.9 C)  TempSrc: Oral  Oral Oral  Resp:   20 20  Height:      Weight:      SpO2: 96% 93% 93% 90%    General: Pt is alert, follows commands appropriately, not in acute distress Cardiovascular: Regular rate and rhythm, S1/S2 +, no murmurs Respiratory: Clear to auscultation bilaterally, no wheezing, no crackles, no rhonchi Abdominal: Soft, non tender, non distended, bowel sounds +, no guarding Extremities: no edema, no cyanosis, pulses palpable bilaterally DP and PT Neuro: Grossly nonfocal  Discharge Instructions  Discharge Instructions    Call MD for:  difficulty breathing, headache or visual disturbances    Complete by:  As directed      Call MD for:  persistant dizziness or light-headedness    Complete by:  As directed      Call MD for:  persistant nausea and vomiting    Complete by:  As directed      Call MD for:  severe uncontrolled pain    Complete by:  As directed      Diet - low sodium heart healthy    Complete by:  As directed      Discharge instructions    Complete by:  As directed   Taper down prednisone starting from 50 mg a day, taper down by 5 mg a day down to 0 mg. for ex, today 50 mg, tomorrow 45 mg, then 40 mg the following day and etc...     Increase activity slowly    Complete by:  As directed             Medication List    STOP taking these medications        naproxen 500 MG tablet  Commonly known as:  NAPROSYN      TAKE these medications        albuterol 108 (90 Base) MCG/ACT inhaler  Commonly known as:  PROVENTIL HFA;VENTOLIN HFA  Inhale 1-2 puffs into the lungs every 6 (six) hours as needed for wheezing.     azithromycin 1 g powder  Commonly known as:  ZITHROMAX  Take 1 packet (1 g total) by mouth once.     clonazePAM 2 MG tablet   Commonly known as:  KLONOPIN  Take 2 mg by mouth daily.     COLCHICINE PO  Take 0.6 mg by mouth 2 (two) times daily.     furosemide 20 MG tablet  Commonly known as:  LASIX  Take 2 tablets (40 mg total) by mouth daily.     HYDROcodone-acetaminophen 5-325 MG tablet  Commonly known as:  NORCO/VICODIN  Take 1 tablet by mouth 2 (two) times daily as needed for moderate pain.     loratadine 10 MG tablet  Commonly known as:  CLARITIN  Take 1 tab po qd     metFORMIN 500 MG tablet  Commonly known as:  GLUCOPHAGE  Take 1 tablet (500 mg total) by mouth 2 (two) times daily with a meal.     metoprolol succinate 50 MG 24 hr tablet  Commonly known as:  TOPROL-XL  Take 1 tablet (50 mg total) by mouth daily.     mirtazapine 15 MG tablet  Commonly known as:  REMERON  Take 15 mg by mouth at bedtime.     nicotine 21 mg/24hr patch  Commonly known as:  NICODERM CQ - dosed in mg/24 hours  apply 1 patch to clean dry skin every 24 hours     omeprazole 40 MG capsule  Commonly known as:  PRILOSEC  Take 40 mg by mouth daily.     potassium chloride SA 20 MEQ tablet  Commonly known as:  K-DUR,KLOR-CON  Take 1 tablet (20 mEq total) by mouth daily.     pravastatin 20 MG tablet  Commonly known as:  PRAVACHOL  Take 1 tablet (20 mg total) by mouth daily.     pravastatin 40 MG tablet  Commonly known as:  PRAVACHOL  Take 40 mg by mouth daily.     predniSONE 5 MG tablet  Commonly known as:  DELTASONE  Take 10 tablets (50 mg total) by mouth daily with breakfast. Taper down prednisone starting from 50 mg a day, taper down by 5 mg a day down to 0 mg. for ex, today 50 mg, tomorrow 45 mg, then 40 mg the following day and etc...     SPIRIVA HANDIHALER 18 MCG inhalation capsule  Generic drug:  tiotropium  Place 1 capsule into inhaler and inhale daily.     traMADol 50 MG tablet  Commonly known as:  ULTRAM  TAKE 1-2 TABLETS EVERY 8 HOURS AS NEEDED FOR PAIN     zolpidem 10 MG tablet  Commonly known  as:  AMBIEN  Take 10 mg by mouth at bedtime as needed for sleep.           Follow-up Information    Schedule an appointment as soon as possible for a visit with Barbette Merino, MD.   Specialty:  Internal Medicine   Why:  Follow up appt after recent hospitalization   Contact information:   [address] [place] [address] [phone]        The results of significant diagnostics from this hospitalization (including imaging, microbiology, ancillary and laboratory) are listed below for reference.    Significant Diagnostic Studies: Dg Chest 2 View  04/24/2015  CLINICAL DATA:  Shortness of breath, BILATERAL wheezing, history hypertension, COPD, asthma, chronic bronchitis, smoking EXAM: CHEST  2 VIEW COMPARISON:  12/07/2014 FINDINGS: Enlargement of cardiac silhouette with pulmonary vascular congestion. Mediastinal contours normal. Perihilar and basilar infiltrates likely pulmonary edema. No gross pleural effusion or pneumothorax. Scattered endplate spur formation thoracic spine. IMPRESSION: Mild CHF, little changed versus prior exam. Electronically Signed   By: Lavonia Dana M.D.   On: 04/24/2015 16:34   Ct Head Wo Contrast  04/24/2015  CLINICAL DATA:  Acute onset slurred speech EXAM: CT HEAD WITHOUT CONTRAST TECHNIQUE: Contiguous axial images were obtained from the base of the skull through the vertex without intravenous contrast. COMPARISON:  February 17, 2013 FINDINGS: The ventricles are normal in size and configuration. There is no intracranial mass, hemorrhage, extra-axial fluid collection, or midline shift. Gray-white compartments appear normal. No acute infarct evident. The bony calvarium appears intact. The mastoid air cells are clear. No intraorbital lesions are identified. IMPRESSION: Study within normal limits. Electronically Signed  By: Lowella Grip III M.D.   On: 04/24/2015 16:07    Microbiology: No results found for this or any previous visit (from the past 240 hour(s)).    Labs: Basic Metabolic Panel:  Recent Labs Lab 04/24/15 1557 04/25/15 0540 04/27/15 0548  NA 141 143 144  K 4.6 5.2* 4.3  CL 100* 98* 95*  CO2 34* 34* 40*  GLUCOSE 122* 154* 108*  BUN 10 11 17   CREATININE 0.47 0.51 0.47  CALCIUM 9.0 9.4 9.0   Liver Function Tests:  Recent Labs Lab 04/25/15 0540  AST 18  ALT 24  ALKPHOS 82  BILITOT 0.7  PROT 7.6  ALBUMIN 3.8   No results for input(s): LIPASE, AMYLASE in the last 168 hours. No results for input(s): AMMONIA in the last 168 hours. CBC:  Recent Labs Lab 04/24/15 1557 04/25/15 0540 04/26/15 1050  WBC 19.0* 23.8* 24.1*  NEUTROABS 15.4*  --   --   HGB 10.7* 10.4* 10.3*  HCT 36.4 37.5 37.4  MCV 84.1 88.2 89.9  PLT 311 351 352   Cardiac Enzymes:  Recent Labs Lab 04/24/15 1557  TROPONINI <0.03   BNP: BNP (last 3 results)  Recent Labs  12/07/14 1327 04/24/15 1557  BNP 97.3 111.3*    ProBNP (last 3 results) No results for input(s): PROBNP in the last 8760 hours.  CBG:  Recent Labs Lab 04/26/15 0750  GLUCAP 133*

## 2015-04-27 NOTE — Progress Notes (Signed)
Patient transferred to [facility] Rm# 1339 via bed to receiving nurse Juliette Mangle.

## 2015-04-27 NOTE — Progress Notes (Addendum)
02 orders, medical necessity form, and 02 saturation qualifications faxed to [employer]. Rodena Piety informed pt would need a 02 travel tank to get home. Awaiting travel tank to be delivered to pt room.  Marney Doctor RN,BSN,NCM [phone]

## 2015-05-13 ENCOUNTER — Encounter: Payer: Medicaid Other | Admitting: Podiatry

## 2015-05-13 NOTE — Progress Notes (Signed)
This encounter was created in error - please disregard.

## 2015-05-24 ENCOUNTER — Encounter: Payer: Self-pay | Admitting: Physician Assistant

## 2015-05-24 ENCOUNTER — Telehealth: Payer: Self-pay | Admitting: Cardiology

## 2015-05-24 NOTE — Progress Notes (Signed)
Cardiology Office Note:    Date:  05/25/2015   ID:  [name], DOB [birth year], MRN [id number]  PCP:  Barbette Merino, MD  Cardiologist:  Dr. Loralie Champagne   Electrophysiologist:  n/a  Chief Complaint  Patient presents with  . Leg Swelling  . Chest Pain    History of Present Illness:     Michele Owens is a 55 y.o. female with a hx of HTN, obesity, OSA, hyperlipidemia, and SVT.  She has a history of atypical chest pain. ETT-Cardiolite in 12/13 was normal. Echo in 4/13 showed normal EF. Last seen by Dr. Loralie Champagne in 1/16.  She noted that heart does race when she gets upset or stressed. She has been unable to tolerate CPAP.    Admitted in 8/16 with acute diastolic HF and AECOPD.  She was treated with diuretics, steroids, bronchodilators.  Echo showed normal LVF.    Admitted 04/2015 with AECOPD.    She presents today with complaints of chest pain and increasing LE edema over the past few days.  Chest pain is intermittent and sharp. She notes pain in her R and L chest. She has CP with exertion and at rest.  She notes assoc dyspnea and diaphoresis.  She sleeps on 2 pillows chronically. She denies PND.  She is on chronic O2 and sleeps with O2 as well.  She denies syncope.  She saw ortho recently and had arthrocentesis of her L knee.  She has a cough with clear sputum. No hemoptysis.  She recently quit smoking cigarettes.     Past Medical History  Diagnosis Date  . COPD (chronic obstructive pulmonary disease) ([place])   . Diabetes mellitus without complication ([place])   . CHF (congestive heart failure) ([place])   . Shortness of breath dyspnea   1. Back and hip pain 2. HTN 3. Obesity 4. GERD 5. Hyperlipidemia 6. Depression 7. COPD: Active smoker 8. 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 Event monitor (12/14) with only NSR noted.  10. Chest pain: Atypical. ETT-Cardiolite (12/13) with EF 60%, no ischemia/infarction.  11. Syncope:  Uncertain etiology. Echo (4/13) with EF 55-60%, no significant valvular abnormalities.  12. OSA: Cannot tolerate CPAP.  13. Gout 14. Migraines  Past Surgical History  Procedure Laterality Date  . Bunionectomy Bilateral   . Tonsillectomy    . Total abdominal hysterectomy  07/14/10    Current Medications: Outpatient Prescriptions Prior to Visit  Medication Sig Dispense Refill  . albuterol (PROVENTIL HFA;VENTOLIN HFA) 108 (90 BASE) MCG/ACT inhaler Inhale 1-2 puffs into the lungs every 6 (six) hours as needed for wheezing. 1 Inhaler 0  . clonazePAM (KLONOPIN) 2 MG tablet Take 2 mg by mouth daily.  0  . COLCHICINE PO Take 0.6 mg by mouth 2 (two) times daily.     Marland Kitchen HYDROcodone-acetaminophen (NORCO/VICODIN) 5-325 MG per tablet Take 1 tablet by mouth 2 (two) times daily as needed for moderate pain.    . metFORMIN (GLUCOPHAGE) 500 MG tablet Take 1 tablet (500 mg total) by mouth 2 (two) times daily with a meal. 30 tablet 1  . metoprolol succinate (TOPROL-XL) 50 MG 24 hr tablet Take 1 tablet (50 mg total) by mouth daily. 30 tablet 11  . mirtazapine (REMERON) 15 MG tablet Take 15 mg by mouth at bedtime.  0  . nicotine (NICODERM CQ - DOSED IN MG/24 HOURS) 21 mg/24hr patch apply 1 patch to clean dry skin every 24 hours  0  . omeprazole (PRILOSEC)  40 MG capsule Take 40 mg by mouth daily.  2  . pravastatin (PRAVACHOL) 40 MG tablet Take 40 mg by mouth daily.   2  . predniSONE (DELTASONE) 5 MG tablet Take 10 tablets (50 mg total) by mouth daily with breakfast. Taper down prednisone starting from 50 mg a day, taper down by 5 mg a day down to 0 mg. for ex, today 50 mg, tomorrow 45 mg, then 40 mg the following day and etc... 55 tablet 0  . SPIRIVA HANDIHALER 18 MCG inhalation capsule Place 1 capsule into inhaler and inhale daily.   2  . furosemide (LASIX) 20 MG tablet Take 2 tablets (40 mg total) by mouth daily. 30 tablet 1  . potassium chloride SA (K-DUR,KLOR-CON) 20 MEQ tablet Take 1 tablet (20 mEq total)  by mouth daily. 30 tablet 1  . azithromycin (ZITHROMAX) 1 g powder Take 1 packet (1 g total) by mouth once. (Patient not taking: Reported on 05/25/2015) 1 each 0  . loratadine (CLARITIN) 10 MG tablet Reported on 05/25/2015  2  . pravastatin (PRAVACHOL) 20 MG tablet Take 1 tablet (20 mg total) by mouth daily. (Patient not taking: Reported on 05/25/2015) 30 tablet 2  . traMADol (ULTRAM) 50 MG tablet Reported on 05/25/2015  0  . zolpidem (AMBIEN) 10 MG tablet Take 10 mg by mouth at bedtime as needed for sleep. Reported on 05/25/2015     No facility-administered medications prior to visit.     Allergies:   Lamisil and Ibuprofen   Social History   Social History  . Marital Status: Single    Spouse Name: N/A  . Number of Children: N/A  . Years of Education: N/A   Occupational History  . unemployeed    Social History Main Topics  . Smoking status: Current Every Day Smoker -- 1.00 packs/day for 41 years    Types: Cigarettes  . Smokeless tobacco: Never Used  . Alcohol Use: Yes     Comment: 12/07/2014 "might have a beer a couple times/month"  . Drug Use: Yes    Special: Marijuana     Comment: 12/07/2014 "once in a blue moon"  . Sexual Activity: Yes   Other Topics Concern  . None   Social History Narrative     Family History:  The patient's family history includes Cancer in her father.   ROS:   Please see the history of present illness.    ROS All other systems reviewed and are negative.   Physical Exam:    VS:  BP 120/60 mmHg  Pulse 98  Ht 5\' 5"  (1.651 m)  Wt 288 lb 9.6 oz (130.908 kg)  BMI 48.03 kg/m2   GEN: Well nourished, well developed, in no acute distress HEENT: normal Neck: difficult to assess JVD, no masses Cardiac: Normal S1/S2,  RRR; no murmurs,  1+ bilateral LE edema    Respiratory:  Decreased breath sounds bilaterally, ? Faint crackles at the bases bilaterally. GI: soft, nontender MS: no deformity  Skin: warm and dry  Neuro:  no focal deficits  Psych: Alert  and oriented x 3, normal affect  Wt Readings from Last 3 Encounters:  05/25/15 288 lb 9.6 oz (130.908 kg)  04/26/15 296 lb 15.4 oz (134.7 kg)  12/11/14 297 lb 3.2 oz (134.809 kg)      Studies/Labs Reviewed:     EKG:  EKG is  ordered today.  The ekg ordered today demonstrates NSR, HR 96, normal axis, NSSTTW changes, QTc 442 ms  Recent  Labs: 12/07/2014: TSH 0.826 04/24/2015: B Natriuretic Peptide 111.3* 04/25/2015: ALT 24 04/26/2015: Hemoglobin 10.3*; Platelets 352 04/27/2015: BUN 17; Creatinine, Ser 0.47; Potassium 4.3; Sodium 144   Recent Lipid Panel No results found for: CHOL, TRIG, HDL, CHOLHDL, VLDL, LDLCALC, LDLDIRECT  Additional studies/ records that were reviewed today include:   Echo 8/16 EF 55-60%, no RWMA, Gr 2 DD, trivial TR  Event Monitor 12/14 No sig arrhythmias   Myoview 12/13 Normal stress nuclear study. There are no significant abnormalities noted.  LV Ejection Fraction: 60%.    ASSESSMENT:     1. Shortness of breath   2. Acute on chronic diastolic CHF (congestive heart failure) ([facility])   3. Other chest pain   4. Essential hypertension   5. Chronic obstructive pulmonary disease, unspecified COPD type ([place])   6. Smoking   7. Obesity      PLAN:     In order of problems listed above:  1. Dyspnea - Likely related to a/c diastolic CHF.  She also has COPD and likely has OHS.  She would probably benefit from eventually seeing Pulmonology.  Exam is difficult. I will attempt diuresis for 1 week.  Will obtain stress testing, CXR and BNP today.  2. A/C Diastolic CHF - As noted, she is probably volume overloaded.  Increase Lasix to 40 mg bid x 1 week.  Increase K+ to 20 mEq bid x 1 week.  Then resume usual dose of Lasix and K+.  BMET, BNP today. BMET in 1 week.  FU in the CHF Clinic in 1 week.   3. Chest Pain - Atypical. She has CRFs of DM2, HTN, Smoking. Arrange Lexiscan Myoview to rule out ischemia.  4. HTN - Controlled.   5. COPD - As noted, she would likely  benefit from eventually seeing pulmonology.    6. Smoking - She recently quit smoking.   7. Obesity - I suspect she also has OHS.     Medication Adjustments/Labs and Tests Ordered: Current medicines are reviewed at length with the patient today.  Concerns regarding medicines are outlined above.  Medication changes, Labs and Tests ordered today are outlined in the Patient Instructions noted below. Patient Instructions  Medication Instructions:  1. Take LASIX 40 mg Twice daily 6 hours apart for one week then resume previous dose. Refills have been sent in to the pharmacy for you today 2. Take POTASSIUM 20 meq Twice daily for one week and then resume previous dose. Refills have been sent in to the pharmacy for you today  Labwork: Labs today BMET and BNP Return in 1 week for repeat BMET  Testing/Procedures: 1. Your physician has requested that you have a lexiscan myoview. For further information please visit HugeFiesta.[email or web address]. Please follow instruction sheet, as given. 2. Chest xray today at [facility] at [address]   Follow-Up: Follow up in Heart Failure clinic with Dr. Sherie Don in 1 week. [phone]  Any Other Special Instructions Will Be Listed Below (If Applicable).  If you need a refill on your cardiac medications before your next appointment, please call your pharmacy.     Signed, Richardson Dopp, PA-C  05/25/2015 5:07 PM    [facility] [address] [place], [place], [place]  [address] Phone: [phone]; Fax: [phone]

## 2015-05-24 NOTE — Telephone Encounter (Signed)
Michele Owens spoke with Truitt Merle, NP and states that she said to get pt in to be seen this week. Spoke with pt and informed her of recommendation. Pt very concerned.  Advised pt that I could get her in tomorrow for an appt but that if symptoms worsened she should absolutely go to the ED. Pt states that she is concerned due to the very recent loss of two friends, one being her age. Scheduled pt to be seen tomorrow by Richardson Dopp, PA-C at 10AM. Pt verbalized understanding and was in agreement with this plan.

## 2015-05-24 NOTE — Telephone Encounter (Signed)
Pt c/o of Chest Pain: STAT if CP now or developed within 24 hours  1. Are you having CP right now? No- sore   2. Are you experiencing any other symptoms (ex. SOB, nausea, vomiting, sweating)? Swelling- breast pain   3. How long have you been experiencing CP? About 1 week   4. Is your CP continuous or coming and going? Comes/goes    5. Have you taken Nitroglycerin? No  ?

## 2015-05-24 NOTE — Telephone Encounter (Signed)
Pt called today because she has been having fluid build up in all he body and LE knees ankles and feet for a long time. Pt states swelling  is worse now. Pt has history of CHF and COPD. Pt was in the ED on 1/17 with Acute respiratory failure. Pt is on PO2 NP. Pt takes Lasix 40 mg one tablet by mouth twice a day, and Potassium 20 mEq once daily. Pt states the lasix is not working. She has support hose and keep her legs up all this is not helping Pt  Said she needs to get this fluid out of her system. Pt also states that last week she has had fluid drawn  out of her knees by the orthopedic doctor , and she needed to have it done again soon.

## 2015-05-25 ENCOUNTER — Telehealth: Payer: Self-pay | Admitting: Physician Assistant

## 2015-05-25 ENCOUNTER — Encounter: Payer: Self-pay | Admitting: Physician Assistant

## 2015-05-25 ENCOUNTER — Ambulatory Visit (INDEPENDENT_AMBULATORY_CARE_PROVIDER_SITE_OTHER): Payer: Medicaid Other | Admitting: Physician Assistant

## 2015-05-25 VITALS — BP 120/60 | HR 98 | Ht 65.0 in | Wt 288.6 lb

## 2015-05-25 DIAGNOSIS — I5033 Acute on chronic diastolic (congestive) heart failure: Secondary | ICD-10-CM | POA: Diagnosis not present

## 2015-05-25 DIAGNOSIS — I10 Essential (primary) hypertension: Secondary | ICD-10-CM | POA: Diagnosis not present

## 2015-05-25 DIAGNOSIS — R0602 Shortness of breath: Secondary | ICD-10-CM

## 2015-05-25 DIAGNOSIS — E669 Obesity, unspecified: Secondary | ICD-10-CM

## 2015-05-25 DIAGNOSIS — R0789 Other chest pain: Secondary | ICD-10-CM | POA: Diagnosis not present

## 2015-05-25 DIAGNOSIS — F17200 Nicotine dependence, unspecified, uncomplicated: Secondary | ICD-10-CM

## 2015-05-25 DIAGNOSIS — Z720 Tobacco use: Secondary | ICD-10-CM

## 2015-05-25 DIAGNOSIS — J449 Chronic obstructive pulmonary disease, unspecified: Secondary | ICD-10-CM

## 2015-05-25 LAB — BASIC METABOLIC PANEL
BUN: 11 mg/dL (ref 7–25)
CHLORIDE: 99 mmol/L (ref 98–110)
CO2: 28 mmol/L (ref 20–31)
Calcium: 9.4 mg/dL (ref 8.6–10.4)
Creat: 0.67 mg/dL (ref 0.50–1.05)
Glucose, Bld: 101 mg/dL — ABNORMAL HIGH (ref 65–99)
POTASSIUM: 4 mmol/L (ref 3.5–5.3)
SODIUM: 137 mmol/L (ref 135–146)

## 2015-05-25 MED ORDER — POTASSIUM CHLORIDE CRYS ER 20 MEQ PO TBCR: 20.0000 meq | EXTENDED_RELEASE_TABLET | ORAL | Status: DC

## 2015-05-25 MED ORDER — FUROSEMIDE 20 MG PO TABS: 40.0000 mg | ORAL_TABLET | ORAL | Status: DC

## 2015-05-25 NOTE — Patient Instructions (Addendum)
Medication Instructions:  1. Take LASIX 40 mg Twice daily 6 hours apart for one week then resume previous dose. Refills have been sent in to the pharmacy for you today 2. Take POTASSIUM 20 meq Twice daily for one week and then resume previous dose. Refills have been sent in to the pharmacy for you today  Labwork: Labs today BMET and BNP Return in 1 week for repeat BMET  Testing/Procedures: 1. Your physician has requested that you have a lexiscan myoview. For further information please visit [email or web address]. Please follow instruction sheet, as given. 2. Chest xray today at [facility] at [address]   Follow-Up: Follow up in Heart Failure clinic with Dr. Sherie Don in 1 week. [phone]  Any Other Special Instructions Will Be Listed Below (If Applicable).  If you need a refill on your cardiac medications before your next appointment, please call your pharmacy.

## 2015-05-25 NOTE — Telephone Encounter (Signed)
Pt called and was upset that her lasix and K+ were not sent in yet. I advised pt that I only just found out about needing the refills and that I sent them into to [place]. Pt said I need to call her PCP to let them know as well. I stated to pt that is not normal protocol for [place] to call PCP to let them know that we are sending in her cardiac meds. I did tell pt that I will do it this one time only. I called her PCP Dr. Jonelle Sidle and told them we sent in her lasix and K+.

## 2015-05-26 LAB — BRAIN NATRIURETIC PEPTIDE: BRAIN NATRIURETIC PEPTIDE: 6.5 pg/mL (ref <100)

## 2015-05-27 ENCOUNTER — Ambulatory Visit (INDEPENDENT_AMBULATORY_CARE_PROVIDER_SITE_OTHER): Payer: Medicaid Other | Admitting: Podiatry

## 2015-05-27 ENCOUNTER — Ambulatory Visit: Payer: Medicaid Other | Admitting: Podiatry

## 2015-05-27 ENCOUNTER — Telehealth: Payer: Self-pay | Admitting: *Deleted

## 2015-05-27 ENCOUNTER — Encounter: Payer: Self-pay | Admitting: Podiatry

## 2015-05-27 DIAGNOSIS — M79676 Pain in unspecified toe(s): Secondary | ICD-10-CM | POA: Diagnosis not present

## 2015-05-27 DIAGNOSIS — B351 Tinea unguium: Secondary | ICD-10-CM | POA: Diagnosis not present

## 2015-05-27 DIAGNOSIS — M722 Plantar fascial fibromatosis: Secondary | ICD-10-CM | POA: Diagnosis not present

## 2015-05-27 DIAGNOSIS — E1142 Type 2 diabetes mellitus with diabetic polyneuropathy: Secondary | ICD-10-CM | POA: Diagnosis not present

## 2015-05-27 DIAGNOSIS — M7661 Achilles tendinitis, right leg: Secondary | ICD-10-CM

## 2015-05-27 NOTE — Telephone Encounter (Signed)
I called pt w/lab results and verified repeat lab on 2/22 when she comes in for her myoview. Pt then begins yelling at me stating she was told her test was 2/23 and 2/24 12:30. I explained to her that I see the myoview appt's are 2/22 & 2/23 12:30. Pt continue yelling at me and said she wish we could get it together and get her schedule right. I asked pt to stop yelling at me and that I can have Vashti Hey call her and verify myoview appt dates. I explained I do not know what Vashti Hey at [facility] wrote on her paper work for her. Pt said now she has to call transportation and change the dates. I advised let her s/w Neoma Laming before doing that. Pt states that we are confusing her.

## 2015-05-28 ENCOUNTER — Ambulatory Visit
Admission: RE | Admit: 2015-05-28 | Discharge: 2015-05-28 | Disposition: A | Discharge status: Home / Self Care | Payer: Medicaid Other | Source: Ambulatory Visit | Attending: Physician Assistant | Admitting: Physician Assistant

## 2015-05-28 ENCOUNTER — Telehealth: Payer: Self-pay | Admitting: *Deleted

## 2015-05-28 DIAGNOSIS — R0602 Shortness of breath: Secondary | ICD-10-CM

## 2015-05-28 DIAGNOSIS — I5033 Acute on chronic diastolic (congestive) heart failure: Secondary | ICD-10-CM

## 2015-05-28 NOTE — Telephone Encounter (Signed)
Pt asked what was wrong with her feet.  I told she had tendinitis, and spelled for her.

## 2015-05-29 NOTE — Progress Notes (Signed)
She presents today with a chief complaint of painful elongated toenails and pain to her right heel.  Objective: Vital signs are stable she is alert and oriented 3. Pulses are palpable. No calf pain. No open lesions or wounds. Neurologic sensorium is intact. She has pain on palpation medial calcaneal tubercle of her right heel. Her toenails are thick yellow dystrophic, mycotic and painful palpation.  Assessment: Plantar fasciitis right foot. Associated Achilles tendinitis. Pain in limb secondary to onychomycosis.  Plan: Debridement of toenails 1 through 5 bilateral. Injected the left heel today with Kenalog and local anesthetic. Follow-up with her in 1 month.

## 2015-05-31 ENCOUNTER — Telehealth (HOSPITAL_COMMUNITY): Payer: Self-pay | Admitting: *Deleted

## 2015-05-31 NOTE — Telephone Encounter (Signed)
Patient given detailed instructions per Myocardial Perfusion Study Information Sheet for the test on 06/02/15 at 12:30. Patient notified to arrive 15 minutes early and that it is imperative to arrive on time for appointment to keep from having the test rescheduled.  If you need to cancel or reschedule your appointment, please call the office within 24 hours of your appointment. Failure to do so may result in a cancellation of your appointment, and a $50 no show fee. Patient verbalized understanding.Michele Owens

## 2015-06-02 ENCOUNTER — Other Ambulatory Visit (INDEPENDENT_AMBULATORY_CARE_PROVIDER_SITE_OTHER): Payer: Medicaid Other | Admitting: *Deleted

## 2015-06-02 ENCOUNTER — Ambulatory Visit (HOSPITAL_COMMUNITY): Payer: Medicaid Other | Attending: Physician Assistant

## 2015-06-02 DIAGNOSIS — R0789 Other chest pain: Secondary | ICD-10-CM | POA: Diagnosis not present

## 2015-06-02 DIAGNOSIS — R0602 Shortness of breath: Secondary | ICD-10-CM

## 2015-06-02 DIAGNOSIS — R0609 Other forms of dyspnea: Secondary | ICD-10-CM | POA: Insufficient documentation

## 2015-06-02 DIAGNOSIS — R9439 Abnormal result of other cardiovascular function study: Secondary | ICD-10-CM | POA: Diagnosis not present

## 2015-06-02 DIAGNOSIS — I10 Essential (primary) hypertension: Secondary | ICD-10-CM | POA: Diagnosis not present

## 2015-06-02 DIAGNOSIS — E119 Type 2 diabetes mellitus without complications: Secondary | ICD-10-CM | POA: Diagnosis not present

## 2015-06-02 DIAGNOSIS — I5033 Acute on chronic diastolic (congestive) heart failure: Secondary | ICD-10-CM

## 2015-06-02 LAB — BASIC METABOLIC PANEL
BUN: 11 mg/dL (ref 7–25)
CHLORIDE: 98 mmol/L (ref 98–110)
CO2: 34 mmol/L — AB (ref 20–31)
CREATININE: 0.62 mg/dL (ref 0.50–1.05)
Calcium: 9.4 mg/dL (ref 8.6–10.4)
Glucose, Bld: 91 mg/dL (ref 65–99)
POTASSIUM: 4.2 mmol/L (ref 3.5–5.3)
SODIUM: 141 mmol/L (ref 135–146)

## 2015-06-02 MED ORDER — TECHNETIUM TC 99M SESTAMIBI GENERIC - CARDIOLITE
30.4000 | Freq: Once | INTRAVENOUS | Status: AC | PRN
  Administered 2015-06-02: 30 via INTRAVENOUS

## 2015-06-02 MED ORDER — REGADENOSON 0.4 MG/5ML IV SOLN
0.4000 mg | Freq: Once | INTRAVENOUS | Status: AC
  Administered 2015-06-02: 0.4 mg via INTRAVENOUS

## 2015-06-03 ENCOUNTER — Ambulatory Visit (HOSPITAL_COMMUNITY): Payer: Medicaid Other | Attending: Cardiovascular Disease

## 2015-06-03 ENCOUNTER — Telehealth: Payer: Self-pay | Admitting: *Deleted

## 2015-06-03 LAB — MYOCARDIAL PERFUSION IMAGING
CHL CUP NUCLEAR SDS: 6
CHL CUP NUCLEAR SRS: 8
CHL CUP RESTING HR STRESS: 85 {beats}/min
LV dias vol: 110 mL
LV sys vol: 35 mL
Peak HR: 102 {beats}/min
RATE: 0.45
SSS: 14
TID: 0.92

## 2015-06-03 MED ORDER — TECHNETIUM TC 99M SESTAMIBI GENERIC - CARDIOLITE
30.4000 | Freq: Once | INTRAVENOUS | Status: AC | PRN
  Administered 2015-06-03: 30 via INTRAVENOUS

## 2015-06-03 NOTE — Telephone Encounter (Signed)
Pt has been notified of her lab results by phone with verbal understanding.

## 2015-06-04 ENCOUNTER — Telehealth: Payer: Self-pay | Admitting: *Deleted

## 2015-06-04 ENCOUNTER — Encounter: Payer: Self-pay | Admitting: Physician Assistant

## 2015-06-04 NOTE — Telephone Encounter (Signed)
Lmptcb to go over myoview results and recommendations. 

## 2015-06-08 ENCOUNTER — Telehealth (HOSPITAL_COMMUNITY): Payer: Self-pay

## 2015-06-08 NOTE — Telephone Encounter (Signed)
HH RN called to report new CHF clinic patient will need a scale and possibly a med bag, pill box, and pill splitter when she comes to her apt next week. Also states she "loves popcorn" and may need extra education from our office regarding strict fluid and salt intake.  Renee Pain

## 2015-06-09 NOTE — Telephone Encounter (Signed)
Pt notified of abnormal myoview results. Pt states she is not having any chest pain/sob and wants to keep her appt w/Dr. Aundra Dubin 3/7 to discuss further for possible cath. Pt asked me to call her Murlean Hark [phone] and her mother [phone]. Pt asked me to please note her chart she has given me permission to be able to s/w her aunt Hassan Rowan and her mother Phineas Semen. Pt said he nerves were all wrecked and she was not sure of what all of this meant.

## 2015-06-15 ENCOUNTER — Encounter (HOSPITAL_COMMUNITY): Payer: Self-pay

## 2015-06-15 ENCOUNTER — Ambulatory Visit (HOSPITAL_COMMUNITY)
Admission: RE | Admit: 2015-06-15 | Discharge: 2015-06-15 | Disposition: A | Discharge status: Home / Self Care | Payer: Medicaid Other | Source: Ambulatory Visit | Attending: Cardiology | Admitting: Cardiology

## 2015-06-15 VITALS — BP 124/88 | HR 101 | Resp 20 | Wt 289.5 lb

## 2015-06-15 DIAGNOSIS — Z7984 Long term (current) use of oral hypoglycemic drugs: Secondary | ICD-10-CM | POA: Diagnosis not present

## 2015-06-15 DIAGNOSIS — Z7982 Long term (current) use of aspirin: Secondary | ICD-10-CM | POA: Diagnosis not present

## 2015-06-15 DIAGNOSIS — K219 Gastro-esophageal reflux disease without esophagitis: Secondary | ICD-10-CM | POA: Diagnosis not present

## 2015-06-15 DIAGNOSIS — J438 Other emphysema: Secondary | ICD-10-CM

## 2015-06-15 DIAGNOSIS — I5032 Chronic diastolic (congestive) heart failure: Secondary | ICD-10-CM | POA: Insufficient documentation

## 2015-06-15 DIAGNOSIS — I110 Hypertensive heart disease with heart failure: Secondary | ICD-10-CM | POA: Diagnosis not present

## 2015-06-15 DIAGNOSIS — Z87891 Personal history of nicotine dependence: Secondary | ICD-10-CM | POA: Diagnosis not present

## 2015-06-15 DIAGNOSIS — G4733 Obstructive sleep apnea (adult) (pediatric): Secondary | ICD-10-CM | POA: Diagnosis not present

## 2015-06-15 DIAGNOSIS — E785 Hyperlipidemia, unspecified: Secondary | ICD-10-CM | POA: Insufficient documentation

## 2015-06-15 DIAGNOSIS — R072 Precordial pain: Secondary | ICD-10-CM

## 2015-06-15 DIAGNOSIS — E119 Type 2 diabetes mellitus without complications: Secondary | ICD-10-CM | POA: Diagnosis not present

## 2015-06-15 DIAGNOSIS — J449 Chronic obstructive pulmonary disease, unspecified: Secondary | ICD-10-CM | POA: Diagnosis not present

## 2015-06-15 DIAGNOSIS — Z833 Family history of diabetes mellitus: Secondary | ICD-10-CM | POA: Insufficient documentation

## 2015-06-15 DIAGNOSIS — M109 Gout, unspecified: Secondary | ICD-10-CM | POA: Insufficient documentation

## 2015-06-15 DIAGNOSIS — R079 Chest pain, unspecified: Secondary | ICD-10-CM | POA: Diagnosis not present

## 2015-06-15 DIAGNOSIS — E669 Obesity, unspecified: Secondary | ICD-10-CM | POA: Insufficient documentation

## 2015-06-15 DIAGNOSIS — R002 Palpitations: Secondary | ICD-10-CM | POA: Diagnosis not present

## 2015-06-15 DIAGNOSIS — J45909 Unspecified asthma, uncomplicated: Secondary | ICD-10-CM | POA: Diagnosis not present

## 2015-06-15 DIAGNOSIS — Z9981 Dependence on supplemental oxygen: Secondary | ICD-10-CM | POA: Diagnosis not present

## 2015-06-15 DIAGNOSIS — Z79899 Other long term (current) drug therapy: Secondary | ICD-10-CM | POA: Insufficient documentation

## 2015-06-15 MED ORDER — FUROSEMIDE 40 MG PO TABS: 40.0000 mg | ORAL_TABLET | Freq: Two times a day (BID) | ORAL | Status: DC

## 2015-06-15 NOTE — Patient Instructions (Addendum)
DECREASE Lasix to 40 mg , one tab twice a day  Your physician has recommended that you have a pulmonary function test. Pulmonary Function Tests are a group of tests that measure how well air moves in and out of your lungs.  You have been referred to [facility]  Your physician has requested that you have a cardiac catheterization. Cardiac catheterization is used to diagnose and/or treat various heart conditions. Doctors may recommend this procedure for a number of different reasons. The most common reason is to evaluate chest pain. Chest pain can be a symptom of coronary artery disease (CAD), and cardiac catheterization can show whether plaque is narrowing or blocking your heart's arteries. This procedure is also used to evaluate the valves, as well as measure the blood flow and oxygen levels in different parts of your heart. For further information please visit [email or web address]. Please follow instruction sheet, as given.  Your physician recommends that you schedule a follow-up appointment in: 3 weeks

## 2015-06-16 DIAGNOSIS — R079 Chest pain, unspecified: Secondary | ICD-10-CM | POA: Insufficient documentation

## 2015-06-16 NOTE — Progress Notes (Signed)
Patient ID: Michele Owens, female   DOB: November 29, 1960, 55 y.o.   MRN: [id number] PCP: Dr. Jonelle Sidle  55 yo with history of HTN, obesity, OSA, COPD, hyperlipidemia, and SVT presents for cardiology evaluation.  She has now quit smoking and is using a nicotine patch.  She was admitted in 99991111 with diastolic CHF and COPD exacerbation.  She was admitted in 1/17 with COPD exacerbation.  She has been under a lot of stress involving her family and friends.  She has episodes of chest pain every 1-2 days that will last for 15 minutes at a time.  No definite trigger but sometimes episodes do occur with walking.   She is short of breath walking up steps and walking longer distances.  This has been progressive.  Weight is up 1 lb since she saw Richardson Dopp.  Of note, she has been taking Naproxen 500 mg bid for headaches.  She is currently taking Lasix 80 mg IV bid, not sure how dose got so high.   She was set up for a Cardiolite in 2/17.  This showed a small, moderate intensity partially reversible inferolateral defect.  Last echo was in 8/16 and showed normal EF with moderate diastolic dysfunction.   Labs (11/12): K 3.6, creatinine 0.73 Labs (12/13): K 3.9, creatinine 0.9, BNP 22 Labs (11/14): K 3.9, creatinine 0.5 Labs (2/17): K 4.2, creatinine 0.62, HCT 37.4  PMH: 1. Back and hip pain 2. HTN 3. Obesity 4. GERD 5. Hyperlipidemia 6. Depression 7. COPD: Has quit smoking. Now on home oxygen but does not use all the time.  8. 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 Event monitor (12/14) with only NSR noted.  10. Chest pain: Atypical.  ETT-Cardiolite (12/13) with EF 60%, no ischemia/infarction. Lexiscan Cardiolite (2/17) with small, moderate-intensity partially reversible inferolateral defect.  11. Syncope: Uncertain etiology.   12. OSA: Cannot tolerate CPAP.  13. Gout 14. Migraines 15. Diabetes 16. Diastolic CHF: Echo (99991111) with EF 55-60%, grade II diastolic  dysfunction.   SH: Former smoker.  Lives alone.  Does not use drugs or ETOH.   FH: Father with cancer, mother with diabetes, brother with fen-phen related valvulopathy  ROS: All systems reviewed and negative except as per HPI.   Current Outpatient Prescriptions  Medication Sig Dispense Refill  . albuterol (PROVENTIL HFA;VENTOLIN HFA) 108 (90 BASE) MCG/ACT inhaler Inhale 1-2 puffs into the lungs every 6 (six) hours as needed for wheezing. 1 Inhaler 0  . clonazePAM (KLONOPIN) 2 MG tablet Take 2 mg by mouth 2 (two) times daily.    . COLCHICINE PO Take 0.6 mg by mouth 2 (two) times daily.     . furosemide (LASIX) 40 MG tablet Take 1 tablet (40 mg total) by mouth 2 (two) times daily. 60 tablet 6  . HYDROcodone-acetaminophen (NORCO/VICODIN) 5-325 MG per tablet Take 1 tablet by mouth 2 (two) times daily as needed for moderate pain.    Marland Kitchen loratadine (CLARITIN) 10 MG tablet Take 10 mg by mouth daily.    . metFORMIN (GLUCOPHAGE) 500 MG tablet Take 1 tablet (500 mg total) by mouth 2 (two) times daily with a meal. 30 tablet 1  . metoprolol succinate (TOPROL-XL) 50 MG 24 hr tablet Take 1 tablet (50 mg total) by mouth daily. 30 tablet 11  . mirtazapine (REMERON) 15 MG tablet Take 15 mg by mouth at bedtime.  0  . naproxen (NAPROSYN) 500 MG tablet Take 500 mg by mouth 2 (two) times daily  with a meal.    . nicotine (NICODERM CQ - DOSED IN MG/24 HOURS) 21 mg/24hr patch apply 1 patch to clean dry skin every 24 hours  0  . omeprazole (PRILOSEC) 40 MG capsule Take 40 mg by mouth daily.  2  . pravastatin (PRAVACHOL) 40 MG tablet Take 40 mg by mouth daily.   2  . SPIRIVA HANDIHALER 18 MCG inhalation capsule Place 1 capsule into inhaler and inhale daily.   2   No current facility-administered medications for this encounter.    BP 124/88 mmHg  Pulse 101  Resp 20  Wt 289 lb 8 oz (131.316 kg)  SpO2 92% General: NAD, obese.  Neck: No JVD, no thyromegaly or thyroid nodule.  Lungs: Clear to auscultation  bilaterally with normal respiratory effort. CV: Nondisplaced PMI.  Heart regular S1/S2, no S3/S4, 1/6 SEM RUSB.  Trace ankle edema.  No carotid bruit.  Normal pedal pulses.  Abdomen: Soft, nontender, no hepatosplenomegaly, no distention.  Skin: Intact without lesions or rashes.  Neurologic: Alert and oriented x 3.  Psych: Normal affect.  Extremities: No clubbing or cyanosis.   Assessment/Plan: 1. Chest pain: She has ongoing episodes of chest pain that occurs at rest and with exertion.  Lexiscan Cardiolite showed a partially reversible inferolateral defect that could be suggestive of ischemia.  - I will plan on left heart cath to assess for CAD given ongoing symptoms.  We discussed risks/benefits of procedure and she consents to procedure. She will have to hold metformin for cath.  - Continue ASA 81 and pravastatin.     2. Chronic diastolic CHF: Echo (99991111) with EF 55-60%, moderate diastolic dysfunction.  She is not volume overloaded on exam though NYHA class III symptoms.  She is on Lasix 80 mg bid.  From [facility] last note, she should be taking 40 mg bid.   - I am going to decrease the Lasix dose to 40 mg bid.   - Stop Naproxen.  - She will have RHC along with LHC to determine filling pressures.  Exam is somewhat difficult.   3. Palpitations:  She has a history of SVT.  She wore an event monitor but did not have significant symptoms or any arrhythmia noted while the monitor was on. This has not been much of a problem recently. - Continue to follow for now, continue Toprol XL.  4. OSA: Unable to tolerate CPAP.    5. COPD: She has quit smoking.  I will refer her to pulmonary and get PFTs.   Loralie Champagne 04/19/2014

## 2015-06-17 ENCOUNTER — Telehealth: Payer: Self-pay | Admitting: Cardiology

## 2015-06-17 NOTE — Telephone Encounter (Signed)
Michele Owens is calling because she is wanting clarification on her medication Furosemide 40mg  . Please call  Thanks

## 2015-06-17 NOTE — Telephone Encounter (Signed)
Returned call to patient. She saw Dr. Aundra Dubin 3/7 in HF clinic - he decreased lasix dose to 40mg  BID. Informed patient of this. She wanted to know if it was OK to take her second dose around 2-3pm - advised OK. No further action necessary.

## 2015-06-18 ENCOUNTER — Telehealth: Payer: Self-pay | Admitting: Cardiology

## 2015-06-18 ENCOUNTER — Ambulatory Visit (INDEPENDENT_AMBULATORY_CARE_PROVIDER_SITE_OTHER): Payer: Medicaid Other | Admitting: Pulmonary Disease

## 2015-06-18 ENCOUNTER — Telehealth (HOSPITAL_COMMUNITY): Payer: Self-pay | Admitting: Vascular Surgery

## 2015-06-18 ENCOUNTER — Encounter: Payer: Self-pay | Admitting: Pulmonary Disease

## 2015-06-18 VITALS — BP 128/82 | HR 100 | Ht 65.0 in | Wt 289.6 lb

## 2015-06-18 DIAGNOSIS — E662 Morbid (severe) obesity with alveolar hypoventilation: Secondary | ICD-10-CM | POA: Diagnosis not present

## 2015-06-18 DIAGNOSIS — Z720 Tobacco use: Secondary | ICD-10-CM

## 2015-06-18 DIAGNOSIS — J9621 Acute and chronic respiratory failure with hypoxia: Secondary | ICD-10-CM

## 2015-06-18 DIAGNOSIS — J449 Chronic obstructive pulmonary disease, unspecified: Secondary | ICD-10-CM | POA: Diagnosis not present

## 2015-06-18 DIAGNOSIS — F17210 Nicotine dependence, cigarettes, uncomplicated: Secondary | ICD-10-CM | POA: Diagnosis not present

## 2015-06-18 DIAGNOSIS — J9622 Acute and chronic respiratory failure with hypercapnia: Secondary | ICD-10-CM

## 2015-06-18 DIAGNOSIS — R0600 Dyspnea, unspecified: Secondary | ICD-10-CM

## 2015-06-18 DIAGNOSIS — I5032 Chronic diastolic (congestive) heart failure: Secondary | ICD-10-CM

## 2015-06-18 DIAGNOSIS — Z6841 Body Mass Index (BMI) 40.0 and over, adult: Secondary | ICD-10-CM

## 2015-06-18 DIAGNOSIS — G4733 Obstructive sleep apnea (adult) (pediatric): Secondary | ICD-10-CM | POA: Diagnosis not present

## 2015-06-18 NOTE — Telephone Encounter (Signed)
New Message  Pt called to discuss her medication dosages ( Fluid pill) She states she is not clear on when and how to take it. Please call to clarify

## 2015-06-18 NOTE — Progress Notes (Signed)
Past medical history She  has a past medical history of COPD (chronic obstructive pulmonary disease) ([place]); Diabetes mellitus without complication ([place]); Chronic diastolic CHF (congestive heart failure) ([place]); and History of nuclear stress test.  Past surgical history She  has past surgical history that includes Bunionectomy (Bilateral); Tonsillectomy; and Total abdominal hysterectomy (07/14/10).  Family history Her family history includes Cancer in her father.  Social history She  reports that she has been smoking Cigarettes.  She has a 41 pack-year smoking history. She has never used smokeless tobacco. She reports that she drinks alcohol. She reports that she uses illicit drugs (Marijuana).  Allergies  Allergen Reactions  . Lamisil [Terbinafine Hcl]     Pt states causes her to feel funny and rash.  . Ibuprofen Rash    Severe rash    Current Outpatient Prescriptions on File Prior to Visit  Medication Sig  . albuterol (PROVENTIL HFA;VENTOLIN HFA) 108 (90 BASE) MCG/ACT inhaler Inhale 1-2 puffs into the lungs every 6 (six) hours as needed for wheezing.  . clonazePAM (KLONOPIN) 2 MG tablet Take 2 mg by mouth 2 (two) times daily.  . COLCHICINE PO Take 0.6 mg by mouth 2 (two) times daily.   . furosemide (LASIX) 40 MG tablet Take 1 tablet (40 mg total) by mouth 2 (two) times daily.  Marland Kitchen HYDROcodone-acetaminophen (NORCO/VICODIN) 5-325 MG per tablet Take 1 tablet by mouth 2 (two) times daily as needed for moderate pain.  Marland Kitchen loratadine (CLARITIN) 10 MG tablet Take 10 mg by mouth daily.  . metFORMIN (GLUCOPHAGE) 500 MG tablet Take 1 tablet (500 mg total) by mouth 2 (two) times daily with a meal.  . metoprolol succinate (TOPROL-XL) 50 MG 24 hr tablet Take 1 tablet (50 mg total) by mouth daily.  . mirtazapine (REMERON) 15 MG tablet Take 15 mg by mouth at bedtime.  . naproxen (NAPROSYN) 500 MG tablet Take 500 mg by mouth 2 (two) times daily with a meal.  . nicotine (NICODERM CQ - DOSED IN MG/24 HOURS)  21 mg/24hr patch apply 1 patch to clean dry skin every 24 hours  . omeprazole (PRILOSEC) 40 MG capsule Take 40 mg by mouth daily.  . pravastatin (PRAVACHOL) 40 MG tablet Take 40 mg by mouth daily.   Marland Kitchen SPIRIVA HANDIHALER 18 MCG inhalation capsule Place 1 capsule into inhaler and inhale daily.    No current facility-administered medications on file prior to visit.   Review of Systems  Constitutional: Negative.  Negative for fever and unexpected weight change.  HENT: Positive for congestion and sinus pressure. Negative for dental problem, ear pain, nosebleeds, postnasal drip, rhinorrhea, sneezing, sore throat and trouble swallowing.   Eyes: Negative.  Negative for redness and itching.  Respiratory: Positive for chest tightness, shortness of breath and wheezing. Negative for cough.   Cardiovascular: Positive for leg swelling. Negative for palpitations.  Gastrointestinal: Negative.  Negative for nausea and vomiting.  Endocrine: Negative.   Genitourinary: Negative.  Negative for dysuria.  Musculoskeletal: Positive for joint swelling and arthralgias.  Skin: Negative.  Negative for rash.  Allergic/Immunologic: Negative.   Neurological: Positive for headaches.  Hematological: Negative.  Does not bruise/bleed easily.  Psychiatric/Behavioral: Negative.  Negative for dysphoric mood. The patient is not nervous/anxious.    Chief Complaint  Patient presents with  . PULMONARY CONSULT    Referred by Dr. Loralie Champagne. Pt c/o SOB with exertion and sternal chest pain that comes and goes and some wheeze when she experiences anxiety. Pt denies cough/chest tightness. Pt is  on O2/2L and states that she does wear it most of the time.     Tests Echo 12/08/14 >> EF 55 to 123456, grade 2 diastolic CHF Myocardial perfusion scan 06/03/15 >> partially reversible inferior lateral defect  Vital signs BP 128/82 mmHg  Pulse 100  Ht 5\' 5"  (1.651 m)  Wt 289 lb 9.6 oz (131.362 kg)  BMI 48.19 kg/m2  SpO2 89%  History  of present illness Michele Owens is a 55 y.o. female for evaluation of dyspnea.  She smokes intermittently >> mostly when she gets mad.  She has occasional cough, and sometimes feels wheeze in her chest.  She has been told before that she has asthma and COPD, but she doesn't understand how this was determined.  She has PFT set up for 06/25/15.  She has been using spiriva and albuterol.  These helps sometimes.  She has oxygen.  She uses it when she gets over tired and winded.  She doesn't use it when she goes to the store, even though she brings the oxygen with her.  She will wait until she gets home to rest, and then puts her oxygen on.  She gets swelling in her legs, but this gets better when she uses her oxygen more.  She had a sleep study in 2014 and this showed sleep apnea.  She can't wear a mask on her face, and never got set up for CPAP.  She didn't know that she could get a nasal pillows mask, and thinks she could try this type of mask.  She had a myocardial perfusion scan and this showed a potential coronary blockage.  She is scheduled for heart cath next week.  Chest xray from 05/28/15 showed bronchitic changes.  Her HCO3 from [facility] on 06/02/15 was 34.  ABG from 12/07/14 >> pH 7.36, PaCO2 52.4, PaO2 66.  Physical exam  General - No distress ENT - No sinus tenderness, no oral exudate, no LAN, no thyromegaly, TM clear, pupils equal/reactive, MP 3 Cardiac - s1s2 regular, no murmur, pulses symmetric Chest - No wheeze/rales/dullness, good air entry, normal respiratory excursion Back - No focal tenderness Abd - Soft, non-tender, no organomegaly, + bowel sounds Ext - 1+ ankle edema Neuro - Normal strength, cranial nerves intact Skin - No rashes Psych - Normal mood, and behavior   Lab Results  Component Value Date   WBC 24.1* 04/26/2015   HGB 10.3* 04/26/2015   HCT 37.4 04/26/2015   MCV 89.9 04/26/2015   PLT 352 04/26/2015    Lab Results  Component Value Date   CREATININE 0.62  06/02/2015   BUN 11 06/02/2015   NA 141 06/02/2015   K 4.2 06/02/2015   CL 98 06/02/2015   CO2 34* 06/02/2015    Lab Results  Component Value Date   ALT 24 04/25/2015   AST 18 04/25/2015   ALKPHOS 82 04/25/2015   BILITOT 0.7 04/25/2015    Lab Results  Component Value Date   TSH 0.826 12/07/2014    BNP    Component Value Date/Time   PROBNP 22.0 03/19/2012 1201     Discussion Her dyspnea is multifactorial >> Diastolic CHF, possible CAD, probable obstructive lung disease with continue tobacco abuse, obesity with deconditioning, obstructive sleep apnea, obesity hypoventilation syndrome, chronic hypoxic/hypercapnic respiratory failure, and medical non compliance due to lack of understanding about her medical conditions and therapies.   Assessment/plan  Tobacco abuse with probable obstructive lung disease. Plan: - continue spiriva, prn albuterol - f/u PFTs - reviewed  options to assist with smoking cessation >> she will try to quit on her own  Obstructive sleep apnea. Plan: - she will need repeat in lab sleep study, and then determine if she needs to consider CPAP/BiPAP +/- nocturnal oxygen - explained that she could use nasal pillow mask with PAP therapy  Obesity hypoventilation syndrome. Plan: - continue supplemental oxygen at night for now pending sleep study results  Chronic hypoxic/hypercapnic respiratory failure. Plan: - advised her to continue with supplemental oxygen, especially with activity  Diastolic CHF and possible CAD. Plan: - she has Rt/Lt heart cath schedule for 06/23/15  Morbid obesity. Plan: - discussed importance of weight loss, and reviewed options to assist with this    Patient Instructions  Will arrange for sleep study Will make sure your breathing test is scheduled  Follow up in 8 weeks     Chesley Mires, MD [facility] Pager:  [phone]

## 2015-06-18 NOTE — Telephone Encounter (Signed)
Spoke with pt and she stated that her question had already been answered. Advised pt if she had any other questions please feel free to call.

## 2015-06-18 NOTE — Patient Instructions (Signed)
Will arrange for sleep study Will make sure your breathing test is scheduled  Follow up in 8 weeks

## 2015-06-18 NOTE — Telephone Encounter (Signed)
Returned pts call.  She was just full of anxiety over everything is going on and that she just needed to be reminded of how to take her lasix. Pt verbalized understanding.

## 2015-06-18 NOTE — Telephone Encounter (Signed)
Pt called she would like Dr. Aundra Dubin to know that she would like to be put to sleep for her cath on 3/15 she states she will get sick if she can see any of the procedure.. Please advise

## 2015-06-18 NOTE — Progress Notes (Signed)
   Subjective:    Patient ID: Michele Owens, female    DOB: [birth year], 55 y.o.   MRN: [id number]  HPI    Review of Systems  Constitutional: Negative.  Negative for fever and unexpected weight change.  HENT: Positive for congestion and sinus pressure. Negative for dental problem, ear pain, nosebleeds, postnasal drip, rhinorrhea, sneezing, sore throat and trouble swallowing.   Eyes: Negative.  Negative for redness and itching.  Respiratory: Positive for chest tightness, shortness of breath and wheezing. Negative for cough.   Cardiovascular: Positive for leg swelling. Negative for palpitations.  Gastrointestinal: Negative.  Negative for nausea and vomiting.  Endocrine: Negative.   Genitourinary: Negative.  Negative for dysuria.  Musculoskeletal: Positive for joint swelling and arthralgias.  Skin: Negative.  Negative for rash.  Allergic/Immunologic: Negative.   Neurological: Positive for headaches.  Hematological: Negative.  Does not bruise/bleed easily.  Psychiatric/Behavioral: Negative.  Negative for dysphoric mood. The patient is not nervous/anxious.        Objective:   Physical Exam        Assessment & Plan:

## 2015-06-21 ENCOUNTER — Telehealth: Payer: Self-pay | Admitting: Pulmonary Disease

## 2015-06-21 NOTE — Telephone Encounter (Signed)
LMTCB

## 2015-06-22 ENCOUNTER — Ambulatory Visit: Payer: Medicaid Other | Admitting: Podiatry

## 2015-06-23 ENCOUNTER — Observation Stay (HOSPITAL_COMMUNITY)
Admission: RE | Admit: 2015-06-23 | Discharge: 2015-06-24 | Disposition: A | Discharge status: Home / Self Care | Payer: Medicaid Other | Source: Ambulatory Visit | Attending: Cardiology | Admitting: Cardiology

## 2015-06-23 ENCOUNTER — Encounter (HOSPITAL_COMMUNITY)
Admission: RE | Disposition: A | Discharge status: Home / Self Care | Payer: Self-pay | Source: Ambulatory Visit | Attending: Cardiology

## 2015-06-23 ENCOUNTER — Other Ambulatory Visit (HOSPITAL_COMMUNITY): Payer: Self-pay | Admitting: Cardiology

## 2015-06-23 DIAGNOSIS — Z7984 Long term (current) use of oral hypoglycemic drugs: Secondary | ICD-10-CM | POA: Diagnosis not present

## 2015-06-23 DIAGNOSIS — I5032 Chronic diastolic (congestive) heart failure: Secondary | ICD-10-CM | POA: Diagnosis not present

## 2015-06-23 DIAGNOSIS — G4733 Obstructive sleep apnea (adult) (pediatric): Secondary | ICD-10-CM | POA: Insufficient documentation

## 2015-06-23 DIAGNOSIS — R079 Chest pain, unspecified: Secondary | ICD-10-CM

## 2015-06-23 DIAGNOSIS — I209 Angina pectoris, unspecified: Secondary | ICD-10-CM

## 2015-06-23 DIAGNOSIS — Z87891 Personal history of nicotine dependence: Secondary | ICD-10-CM | POA: Insufficient documentation

## 2015-06-23 DIAGNOSIS — E785 Hyperlipidemia, unspecified: Secondary | ICD-10-CM | POA: Diagnosis not present

## 2015-06-23 DIAGNOSIS — E119 Type 2 diabetes mellitus without complications: Secondary | ICD-10-CM | POA: Insufficient documentation

## 2015-06-23 DIAGNOSIS — E669 Obesity, unspecified: Secondary | ICD-10-CM | POA: Insufficient documentation

## 2015-06-23 DIAGNOSIS — G733 Myasthenic syndromes in other diseases classified elsewhere: Secondary | ICD-10-CM | POA: Insufficient documentation

## 2015-06-23 DIAGNOSIS — R002 Palpitations: Secondary | ICD-10-CM | POA: Insufficient documentation

## 2015-06-23 DIAGNOSIS — I110 Hypertensive heart disease with heart failure: Secondary | ICD-10-CM | POA: Diagnosis not present

## 2015-06-23 DIAGNOSIS — J449 Chronic obstructive pulmonary disease, unspecified: Secondary | ICD-10-CM | POA: Diagnosis not present

## 2015-06-23 DIAGNOSIS — Z9981 Dependence on supplemental oxygen: Secondary | ICD-10-CM | POA: Diagnosis not present

## 2015-06-23 DIAGNOSIS — I272 Other secondary pulmonary hypertension: Secondary | ICD-10-CM | POA: Diagnosis not present

## 2015-06-23 HISTORY — PX: CARDIAC CATHETERIZATION: SHX172

## 2015-06-23 LAB — POCT I-STAT, CHEM 8
BUN: 7 mg/dL (ref 6–20)
CHLORIDE: 102 mmol/L (ref 101–111)
Calcium, Ion: 1.07 mmol/L — ABNORMAL LOW (ref 1.12–1.23)
Creatinine, Ser: 0.5 mg/dL (ref 0.44–1.00)
Glucose, Bld: 91 mg/dL (ref 65–99)
HEMATOCRIT: 36 % (ref 36.0–46.0)
HEMOGLOBIN: 12.2 g/dL (ref 12.0–15.0)
POTASSIUM: 3.3 mmol/L — AB (ref 3.5–5.1)
SODIUM: 145 mmol/L (ref 135–145)
TCO2: 30 mmol/L (ref 0–100)

## 2015-06-23 LAB — CBC
HEMATOCRIT: 39.2 % (ref 36.0–46.0)
HEMOGLOBIN: 11.3 g/dL — AB (ref 12.0–15.0)
MCH: 24.1 pg — AB (ref 26.0–34.0)
MCHC: 28.8 g/dL — ABNORMAL LOW (ref 30.0–36.0)
MCV: 83.8 fL (ref 78.0–100.0)
Platelets: 288 10*3/uL (ref 150–400)
RBC: 4.68 MIL/uL (ref 3.87–5.11)
RDW: 16.8 % — ABNORMAL HIGH (ref 11.5–15.5)
WBC: 8.5 10*3/uL (ref 4.0–10.5)

## 2015-06-23 LAB — POCT I-STAT 3, VENOUS BLOOD GAS (G3P V)
Acid-Base Excess: 5 mmol/L — ABNORMAL HIGH (ref 0.0–2.0)
BICARBONATE: 32.7 meq/L — AB (ref 20.0–24.0)
O2 Saturation: 68 %
PCO2 VEN: 61.9 mmHg — AB (ref 45.0–50.0)
TCO2: 35 mmol/L (ref 0–100)
pH, Ven: 7.332 — ABNORMAL HIGH (ref 7.250–7.300)
pO2, Ven: 39 mmHg (ref 31.0–45.0)

## 2015-06-23 LAB — BASIC METABOLIC PANEL
ANION GAP: 9 (ref 5–15)
BUN: 7 mg/dL (ref 6–20)
CHLORIDE: 102 mmol/L (ref 101–111)
CO2: 30 mmol/L (ref 22–32)
Calcium: 9.8 mg/dL (ref 8.9–10.3)
Creatinine, Ser: 0.56 mg/dL (ref 0.44–1.00)
GFR calc Af Amer: >60 mL/min (ref >60)
GFR calc non Af Amer: >60 mL/min (ref >60)
GLUCOSE: 105 mg/dL — AB (ref 65–99)
POTASSIUM: 4 mmol/L (ref 3.5–5.1)
Sodium: 141 mmol/L (ref 135–145)

## 2015-06-23 LAB — GLUCOSE, CAPILLARY
GLUCOSE-CAPILLARY: 147 mg/dL — AB (ref 65–99)
Glucose-Capillary: 107 mg/dL — ABNORMAL HIGH (ref 65–99)
Glucose-Capillary: 143 mg/dL — ABNORMAL HIGH (ref 65–99)

## 2015-06-23 LAB — PROTIME-INR
INR: 0.98 (ref 0.00–1.49)
Prothrombin Time: 13.2 seconds (ref 11.6–15.2)

## 2015-06-23 SURGERY — RIGHT/LEFT HEART CATH AND CORONARY ANGIOGRAPHY
Anesthesia: LOCAL

## 2015-06-23 MED ORDER — TIOTROPIUM BROMIDE MONOHYDRATE 18 MCG IN CAPS
18.0000 ug | ORAL_CAPSULE | Freq: Every day | RESPIRATORY_TRACT | Status: DC
  Administered 2015-06-24: 18 ug via RESPIRATORY_TRACT
  Filled 2015-06-23: qty 5

## 2015-06-23 MED ORDER — ASPIRIN 81 MG PO CHEW
CHEWABLE_TABLET | ORAL | Status: AC
  Filled 2015-06-23: qty 1

## 2015-06-23 MED ORDER — MIDAZOLAM HCL 2 MG/2ML IJ SOLN
INTRAMUSCULAR | Status: AC
  Filled 2015-06-23: qty 2

## 2015-06-23 MED ORDER — ACETAMINOPHEN 325 MG PO TABS
650.0000 mg | ORAL_TABLET | ORAL | Status: DC | PRN
  Administered 2015-06-23 – 2015-06-24 (×3): 650 mg via ORAL
  Filled 2015-06-23 (×2): qty 2

## 2015-06-23 MED ORDER — IOHEXOL 350 MG/ML SOLN
INTRAVENOUS | Status: DC | PRN
  Administered 2015-06-23: 75 mL via INTRA_ARTERIAL

## 2015-06-23 MED ORDER — INSULIN ASPART 100 UNIT/ML ~~LOC~~ SOLN: 0.0000 [IU] | Freq: Three times a day (TID) | SUBCUTANEOUS | Status: DC

## 2015-06-23 MED ORDER — SODIUM CHLORIDE 0.9% FLUSH: 3.0000 mL | INTRAVENOUS | Status: DC | PRN

## 2015-06-23 MED ORDER — HEPARIN SODIUM (PORCINE) 1000 UNIT/ML IJ SOLN
INTRAMUSCULAR | Status: DC | PRN
  Administered 2015-06-23: 5500 [IU] via INTRAVENOUS

## 2015-06-23 MED ORDER — CLONAZEPAM 0.5 MG PO TABS
2.0000 mg | ORAL_TABLET | Freq: Two times a day (BID) | ORAL | Status: DC | PRN
  Filled 2015-06-23: qty 4

## 2015-06-23 MED ORDER — HEPARIN SODIUM (PORCINE) 1000 UNIT/ML IJ SOLN
INTRAMUSCULAR | Status: AC
Start: 2015-06-23 — End: 2015-06-23
  Filled 2015-06-23: qty 1

## 2015-06-23 MED ORDER — PANTOPRAZOLE SODIUM 40 MG PO TBEC
40.0000 mg | DELAYED_RELEASE_TABLET | Freq: Every day | ORAL | Status: DC
  Administered 2015-06-24: 40 mg via ORAL
  Filled 2015-06-23: qty 1

## 2015-06-23 MED ORDER — LIDOCAINE HCL (PF) 1 % IJ SOLN
INTRAMUSCULAR | Status: DC | PRN
  Administered 2015-06-23 (×2): 2 mL via INTRADERMAL

## 2015-06-23 MED ORDER — PRAVASTATIN SODIUM 40 MG PO TABS
40.0000 mg | ORAL_TABLET | Freq: Every day | ORAL | Status: DC
  Administered 2015-06-23: 40 mg via ORAL
  Filled 2015-06-23: qty 1

## 2015-06-23 MED ORDER — FENTANYL CITRATE (PF) 100 MCG/2ML IJ SOLN
INTRAMUSCULAR | Status: DC | PRN
  Administered 2015-06-23 (×4): 25 ug via INTRAVENOUS

## 2015-06-23 MED ORDER — SODIUM CHLORIDE 0.9 % WEIGHT BASED INFUSION: 1.0000 mL/kg/h | INTRAVENOUS | Status: DC

## 2015-06-23 MED ORDER — SODIUM CHLORIDE 0.9% FLUSH: 3.0000 mL | Freq: Two times a day (BID) | INTRAVENOUS | Status: DC

## 2015-06-23 MED ORDER — SODIUM CHLORIDE 0.9% FLUSH
3.0000 mL | Freq: Two times a day (BID) | INTRAVENOUS | Status: DC
  Administered 2015-06-23 – 2015-06-24 (×2): 3 mL via INTRAVENOUS

## 2015-06-23 MED ORDER — ONDANSETRON HCL 4 MG/2ML IJ SOLN: 4.0000 mg | Freq: Four times a day (QID) | INTRAMUSCULAR | Status: DC | PRN

## 2015-06-23 MED ORDER — HEPARIN (PORCINE) IN NACL 2-0.9 UNIT/ML-% IJ SOLN
INTRAMUSCULAR | Status: AC
  Filled 2015-06-23: qty 500

## 2015-06-23 MED ORDER — INSULIN ASPART 100 UNIT/ML ~~LOC~~ SOLN: 0.0000 [IU] | Freq: Every day | SUBCUTANEOUS | Status: DC

## 2015-06-23 MED ORDER — SODIUM CHLORIDE 0.9 % IV SOLN: 250.0000 mL | INTRAVENOUS | Status: DC | PRN

## 2015-06-23 MED ORDER — SODIUM CHLORIDE 0.9 % WEIGHT BASED INFUSION: 1.0000 mL/kg/h | INTRAVENOUS | Status: AC

## 2015-06-23 MED ORDER — SODIUM CHLORIDE 0.9 % WEIGHT BASED INFUSION
3.0000 mL/kg/h | INTRAVENOUS | Status: DC
  Administered 2015-06-23: 3 mL/kg/h via INTRAVENOUS

## 2015-06-23 MED ORDER — VERAPAMIL HCL 2.5 MG/ML IV SOLN
INTRAVENOUS | Status: DC | PRN
  Administered 2015-06-23: 10 mL via INTRA_ARTERIAL

## 2015-06-23 MED ORDER — VERAPAMIL HCL 2.5 MG/ML IV SOLN
INTRAVENOUS | Status: AC
  Filled 2015-06-23: qty 2

## 2015-06-23 MED ORDER — POTASSIUM CHLORIDE CRYS ER 20 MEQ PO TBCR
20.0000 meq | EXTENDED_RELEASE_TABLET | Freq: Two times a day (BID) | ORAL | Status: DC
Start: 2015-06-23 — End: 2015-06-24
  Administered 2015-06-23 – 2015-06-24 (×2): 20 meq via ORAL
  Filled 2015-06-23 (×2): qty 1

## 2015-06-23 MED ORDER — ASPIRIN 81 MG PO CHEW
81.0000 mg | CHEWABLE_TABLET | ORAL | Status: AC
  Administered 2015-06-23: 81 mg via ORAL

## 2015-06-23 MED ORDER — MIDAZOLAM HCL 2 MG/2ML IJ SOLN
INTRAMUSCULAR | Status: DC | PRN
  Administered 2015-06-23: 2 mg via INTRAVENOUS
  Administered 2015-06-23 (×3): 1 mg via INTRAVENOUS

## 2015-06-23 MED ORDER — HEPARIN (PORCINE) IN NACL 2-0.9 UNIT/ML-% IJ SOLN
INTRAMUSCULAR | Status: DC | PRN
  Administered 2015-06-23: 1500 mL

## 2015-06-23 MED ORDER — FENTANYL CITRATE (PF) 100 MCG/2ML IJ SOLN
INTRAMUSCULAR | Status: AC
  Filled 2015-06-23: qty 2

## 2015-06-23 MED ORDER — METOPROLOL SUCCINATE ER 50 MG PO TB24
50.0000 mg | ORAL_TABLET | Freq: Every day | ORAL | Status: DC
  Administered 2015-06-24: 50 mg via ORAL
  Filled 2015-06-23: qty 1

## 2015-06-23 MED ORDER — FUROSEMIDE 20 MG PO TABS
60.0000 mg | ORAL_TABLET | Freq: Two times a day (BID) | ORAL | Status: DC
  Administered 2015-06-23 – 2015-06-24 (×2): 60 mg via ORAL
  Filled 2015-06-23 (×2): qty 1

## 2015-06-23 MED ORDER — MIRTAZAPINE 15 MG PO TABS
15.0000 mg | ORAL_TABLET | Freq: Every day | ORAL | Status: DC
  Administered 2015-06-23: 15 mg via ORAL
  Filled 2015-06-23: qty 1

## 2015-06-23 MED ORDER — SODIUM CHLORIDE 0.9% FLUSH
3.0000 mL | INTRAVENOUS | Status: DC | PRN
Start: 2015-06-23 — End: 2015-06-24

## 2015-06-23 MED ORDER — ACETAMINOPHEN 325 MG PO TABS
ORAL_TABLET | ORAL | Status: AC
  Filled 2015-06-23: qty 2

## 2015-06-23 SURGICAL SUPPLY — 14 items
CATH BALLN WEDGE 5F 110CM (CATHETERS) ×1 IMPLANT
CATH INFINITI 5 FR JL3.5 (CATHETERS) ×1 IMPLANT
CATH INFINITI 5FR ANG PIGTAIL (CATHETERS) ×1 IMPLANT
CATH INFINITI JR4 5F (CATHETERS) ×1 IMPLANT
DEVICE RAD COMP TR BAND LRG (VASCULAR PRODUCTS) ×1 IMPLANT
GLIDESHEATH SLEND SS 6F .021 (SHEATH) ×1 IMPLANT
KIT HEART LEFT (KITS) ×2 IMPLANT
PACK CARDIAC CATHETERIZATION (CUSTOM PROCEDURE TRAY) ×2 IMPLANT
SHEATH FAST CATH BRACH 5F 5CM (SHEATH) ×1 IMPLANT
SYR MEDRAD MARK V 150ML (SYRINGE) ×2 IMPLANT
TRANSDUCER W/STOPCOCK (MISCELLANEOUS) ×3 IMPLANT
TUBING CIL FLEX 10 FLL-RA (TUBING) ×2 IMPLANT
WIRE HI TORQ VERSACORE-J 145CM (WIRE) ×1 IMPLANT
WIRE SAFE-T 1.5MM-J .035X260CM (WIRE) ×1 IMPLANT

## 2015-06-23 NOTE — Interval H&P Note (Signed)
History and Physical Interval Note:  06/23/2015 9:44 AM  Michele Owens  has presented today for surgery, with the diagnosis of chf  The various methods of treatment have been discussed with the patient and family. After consideration of risks, benefits and other options for treatment, the patient has consented to  Procedure(s): Right/Left Heart Cath and Coronary Angiography (N/A) as a surgical intervention .  The patient's history has been reviewed, patient examined, no change in status, stable for surgery.  I have reviewed the patient's chart and labs.  Questions were answered to the patient's satisfaction.     [name]

## 2015-06-23 NOTE — Progress Notes (Signed)
Dr. Aundra Dubin in to see pt, determined need to admit pt for observation.  24 hour tele bed requested.  Patient aware and ok with decision to admit.

## 2015-06-23 NOTE — Progress Notes (Signed)
Pt. Informed nurse that she had no one to stay with her tonight.  Dr. Aundra Dubin and PA notified.  Instructed to keep pt. 2 more hours then let pt go home to stay tonight alone.

## 2015-06-23 NOTE — H&P (View-Only) (Signed)
Patient ID: Michele Owens, female   DOB: 1960/08/10, 55 y.o.   MRN: [id number] PCP: Dr. Jonelle Sidle  55 yo with history of HTN, obesity, OSA, COPD, hyperlipidemia, and SVT presents for cardiology evaluation.  She has now quit smoking and is using a nicotine patch.  She was admitted in 99991111 with diastolic CHF and COPD exacerbation.  She was admitted in 1/17 with COPD exacerbation.  She has been under a lot of stress involving her family and friends.  She has episodes of chest pain every 1-2 days that will last for 15 minutes at a time.  No definite trigger but sometimes episodes do occur with walking.   She is short of breath walking up steps and walking longer distances.  This has been progressive.  Weight is up 1 lb since she saw Richardson Dopp.  Of note, she has been taking Naproxen 500 mg bid for headaches.  She is currently taking Lasix 80 mg IV bid, not sure how dose got so high.   She was set up for a Cardiolite in 2/17.  This showed a small, moderate intensity partially reversible inferolateral defect.  Last echo was in 8/16 and showed normal EF with moderate diastolic dysfunction.   Labs (11/12): K 3.6, creatinine 0.73 Labs (12/13): K 3.9, creatinine 0.9, BNP 22 Labs (11/14): K 3.9, creatinine 0.5 Labs (2/17): K 4.2, creatinine 0.62, HCT 37.4  PMH: 1. Back and hip pain 2. HTN 3. Obesity 4. GERD 5. Hyperlipidemia 6. Depression 7. COPD: Has quit smoking. Now on home oxygen but does not use all the time.  8. 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 Event monitor (12/14) with only NSR noted.  10. Chest pain: Atypical.  ETT-Cardiolite (12/13) with EF 60%, no ischemia/infarction. Lexiscan Cardiolite (2/17) with small, moderate-intensity partially reversible inferolateral defect.  11. Syncope: Uncertain etiology.   12. OSA: Cannot tolerate CPAP.  13. Gout 14. Migraines 15. Diabetes 16. Diastolic CHF: Echo (99991111) with EF 55-60%, grade II diastolic  dysfunction.   SH: Former smoker.  Lives alone.  Does not use drugs or ETOH.   FH: Father with cancer, mother with diabetes, brother with fen-phen related valvulopathy  ROS: All systems reviewed and negative except as per HPI.   Current Outpatient Prescriptions  Medication Sig Dispense Refill  . albuterol (PROVENTIL HFA;VENTOLIN HFA) 108 (90 BASE) MCG/ACT inhaler Inhale 1-2 puffs into the lungs every 6 (six) hours as needed for wheezing. 1 Inhaler 0  . clonazePAM (KLONOPIN) 2 MG tablet Take 2 mg by mouth 2 (two) times daily.    . COLCHICINE PO Take 0.6 mg by mouth 2 (two) times daily.     . furosemide (LASIX) 40 MG tablet Take 1 tablet (40 mg total) by mouth 2 (two) times daily. 60 tablet 6  . HYDROcodone-acetaminophen (NORCO/VICODIN) 5-325 MG per tablet Take 1 tablet by mouth 2 (two) times daily as needed for moderate pain.    Marland Kitchen loratadine (CLARITIN) 10 MG tablet Take 10 mg by mouth daily.    . metFORMIN (GLUCOPHAGE) 500 MG tablet Take 1 tablet (500 mg total) by mouth 2 (two) times daily with a meal. 30 tablet 1  . metoprolol succinate (TOPROL-XL) 50 MG 24 hr tablet Take 1 tablet (50 mg total) by mouth daily. 30 tablet 11  . mirtazapine (REMERON) 15 MG tablet Take 15 mg by mouth at bedtime.  0  . naproxen (NAPROSYN) 500 MG tablet Take 500 mg by mouth 2 (two) times daily  with a meal.    . nicotine (NICODERM CQ - DOSED IN MG/24 HOURS) 21 mg/24hr patch apply 1 patch to clean dry skin every 24 hours  0  . omeprazole (PRILOSEC) 40 MG capsule Take 40 mg by mouth daily.  2  . pravastatin (PRAVACHOL) 40 MG tablet Take 40 mg by mouth daily.   2  . SPIRIVA HANDIHALER 18 MCG inhalation capsule Place 1 capsule into inhaler and inhale daily.   2   No current facility-administered medications for this encounter.    BP 124/88 mmHg  Pulse 101  Resp 20  Wt 289 lb 8 oz (131.316 kg)  SpO2 92% General: NAD, obese.  Neck: No JVD, no thyromegaly or thyroid nodule.  Lungs: Clear to auscultation  bilaterally with normal respiratory effort. CV: Nondisplaced PMI.  Heart regular S1/S2, no S3/S4, 1/6 SEM RUSB.  Trace ankle edema.  No carotid bruit.  Normal pedal pulses.  Abdomen: Soft, nontender, no hepatosplenomegaly, no distention.  Skin: Intact without lesions or rashes.  Neurologic: Alert and oriented x 3.  Psych: Normal affect.  Extremities: No clubbing or cyanosis.   Assessment/Plan: 1. Chest pain: She has ongoing episodes of chest pain that occurs at rest and with exertion.  Lexiscan Cardiolite showed a partially reversible inferolateral defect that could be suggestive of ischemia.  - I will plan on left heart cath to assess for CAD given ongoing symptoms.  We discussed risks/benefits of procedure and she consents to procedure. She will have to hold metformin for cath.  - Continue ASA 81 and pravastatin.     2. Chronic diastolic CHF: Echo (99991111) with EF 55-60%, moderate diastolic dysfunction.  She is not volume overloaded on exam though NYHA class III symptoms.  She is on Lasix 80 mg bid.  From [facility] note, she should be taking 40 mg bid.   - I am going to decrease the Lasix dose to 40 mg bid.   - Stop Naproxen.  - She will have RHC along with LHC to determine filling pressures.  Exam is somewhat difficult.   3. Palpitations:  She has a history of SVT.  She wore an event monitor but did not have significant symptoms or any arrhythmia noted while the monitor was on. This has not been much of a problem recently. - Continue to follow for now, continue Toprol XL.  4. OSA: Unable to tolerate CPAP.    5. COPD: She has quit smoking.  I will refer her to pulmonary and get PFTs.   Loralie Champagne 04/19/2014

## 2015-06-23 NOTE — Progress Notes (Signed)
Pt remains stable with stable VS. No bleeding noted. Report called to Rey,R.N. On 3w. Transported without incident to 3W18.

## 2015-06-23 NOTE — Discharge Instructions (Signed)
Radial Site Care °Refer to this sheet in the next few weeks. These instructions provide you with information about caring for yourself after your procedure. Your health care provider may also give you more specific instructions. Your treatment has been planned according to current medical practices, but problems sometimes occur. Call your health care provider if you have any problems or questions after your procedure. °WHAT TO EXPECT AFTER THE PROCEDURE °After your procedure, it is typical to have the following: °· Bruising at the radial site that usually fades within 1-2 weeks. °· Blood collecting in the tissue (hematoma) that may be painful to the touch. It should usually decrease in size and tenderness within 1-2 weeks. °HOME CARE INSTRUCTIONS °· Take medicines only as directed by your health care provider. °· You may shower 24-48 hours after the procedure or as directed by your health care provider. Remove the bandage (dressing) and gently wash the site with plain soap and water. Pat the area dry with a clean towel. Do not rub the site, because this may cause bleeding. °· Do not take baths, swim, or use a hot tub until your health care provider approves. °· Check your insertion site every day for redness, swelling, or drainage. °· Do not apply powder or lotion to the site. °· Do not flex or bend the affected arm for 24 hours or as directed by your health care provider. °· Do not push or pull heavy objects with the affected arm for 24 hours or as directed by your health care provider. °· Do not lift over 10 lb (4.5 kg) for 5 days after your procedure or as directed by your health care provider. °· Ask your health care provider when it is okay to: °¨ Return to work or school. °¨ Resume usual physical activities or sports. °¨ Resume sexual activity. °· Do not drive home if you are discharged the same day as the procedure. Have someone else drive you. °· You may drive 24 hours after the procedure unless otherwise  instructed by your health care provider. °· Do not operate machinery or power tools for 24 hours after the procedure. °· If your procedure was done as an outpatient procedure, which means that you went home the same day as your procedure, a responsible adult should be with you for the first 24 hours after you arrive home. °· Keep all follow-up visits as directed by your health care provider. This is important. °SEEK MEDICAL CARE IF: °· You have a fever. °· You have chills. °· You have increased bleeding from the radial site. Hold pressure on the site. °SEEK IMMEDIATE MEDICAL CARE IF: °· You have unusual pain at the radial site. °· You have redness, warmth, or swelling at the radial site. °· You have drainage (other than a small amount of blood on the dressing) from the radial site. °· The radial site is bleeding, and the bleeding does not stop after 30 minutes of holding steady pressure on the site. °· Your arm or hand becomes pale, cool, tingly, or numb. °  °This information is not intended to replace advice given to you by your health care provider. Make sure you discuss any questions you have with your health care provider. °  °Document Released: 04/29/2010 Document Revised: 04/17/2014 Document Reviewed: 10/13/2013 °Elsevier Interactive Patient Education ©2016 [employer] ° °

## 2015-06-23 NOTE — Progress Notes (Signed)
Patient Information: 1. Suspected CAD (No Prior PCI, No Prior CABG, and No Prior Angiogram Showing > = 50% Angiographic Stenosis) 2. Prior Noninvasive Testing: Stress Test With Imaging (SPECT MPI, Stress Echocardiography, Stress PET, Stress CMR) 3. Intermediate-risk findings (e.g., 5% to 10% ischemic myocardium on stress SPECT MPI or stress PET, stress-induced wall motion abnormality in a single segment on stress echo or stress CMR) 4. Pretest Symptom Status: Symptomatic A (7) Indication: 16;  Michele Owens 06/23/2015

## 2015-06-23 NOTE — Progress Notes (Signed)
Site area: right brachial  Site Prior to Removal:  Level 0  Pressure Applied For 15    Minutes Beginning at 1050  Manual:   yes  Patient Status During Pull:  excellent  Post Pull Groin Site:  Level n/a  Post Pull Instructions Given:  yes  Post Pull Pulses Present:  yes  Dressing Applied:  Yes hypafix and gauge applied   Bedrest Begins: n/a  Comments:  Pt tolerated procedure well.

## 2015-06-23 NOTE — Progress Notes (Signed)
Patient has a ride home but can find no one to stay with her overnight.  For safety, we will keep her overnight in observation and she will go home in the morning.   Michele Owens 06/23/2015

## 2015-06-23 NOTE — Telephone Encounter (Signed)
[email or web address]

## 2015-06-24 ENCOUNTER — Encounter (HOSPITAL_COMMUNITY): Payer: Self-pay | Admitting: Cardiology

## 2015-06-24 DIAGNOSIS — I209 Angina pectoris, unspecified: Secondary | ICD-10-CM | POA: Diagnosis not present

## 2015-06-24 DIAGNOSIS — R079 Chest pain, unspecified: Secondary | ICD-10-CM | POA: Diagnosis not present

## 2015-06-24 LAB — GLUCOSE, CAPILLARY: GLUCOSE-CAPILLARY: 104 mg/dL — AB (ref 65–99)

## 2015-06-24 MED ORDER — ACETAMINOPHEN 325 MG PO TABS: 650.0000 mg | ORAL_TABLET | ORAL | Status: DC | PRN

## 2015-06-24 MED ORDER — FUROSEMIDE 20 MG PO TABS: 60.0000 mg | ORAL_TABLET | Freq: Two times a day (BID) | ORAL | Status: DC

## 2015-06-24 NOTE — Telephone Encounter (Signed)
This is to give patient the appt info for her Sleep Study @[facility] across the street.  I had already left a message for patient to call me and left my direct phone number, prior to this phone message.

## 2015-06-24 NOTE — Telephone Encounter (Signed)
LM for patient x 3 Is this for her sleep study?

## 2015-06-24 NOTE — Telephone Encounter (Signed)
Pt returned my call.  She is aware of the appt info for her Sleep Study.  Pt also aware I am mailing her a Sleep Packet.

## 2015-06-24 NOTE — Plan of Care (Signed)
Problem: Education: Goal: Knowledge of [facility] Education information/materials will improve Outcome: Completed/Met Date Met:  06/24/15 Pt educated throughout hospitalization regarding tests, procedures, labs, and medications   Problem: Safety: Goal: Ability to remain free from injury will improve Outcome: Completed/Met Date Met:  06/24/15 Pt remained free from injury during this admission   Problem: Nutrition: Goal: Adequate nutrition will be maintained Outcome: Completed/Met Date Met:  06/24/15 Pt able to tolerate diet with no difficulties   Problem: Phase II Progression Outcomes Goal: Discharge plan in place and appropriate Outcome: Completed/Met Date Met:  06/24/15 Pt will be discharged home today  Goal: Ambulates up to 600 ft. in hall x 1 Outcome: Completed/Met Date Met:  06/24/15 Pt able to ambulate with no difficulties  Goal: Tolerates diet Outcome: Completed/Met Date Met:  06/24/15 Pt able to tolerate current diet with no difficulties  Goal: Vascular site scale level 0 - I Vascular Site Scale Level 0: No bruising/bleeding/hematoma Level I (Mild): Bruising/Ecchymosis, minimal bleeding/ooozing, palpable hematoma < 3 cm Level II (Moderate): Bleeding not affecting hemodynamic parameters, pseudoaneurysm, palpable hematoma > 3 cm Level III (Severe) Bleeding which affects hemodynamic parameters or retroperitoneal hemorrhage  Outcome: Completed/Met Date Met:  06/24/15 Vascular site level 0

## 2015-06-24 NOTE — Progress Notes (Signed)
Report received in patient's room via Rey RN using SBAR format, reviewed orders, labs, VS, meds and patient's general condition, assumed care of patient. 

## 2015-06-24 NOTE — Discharge Summary (Signed)
Advanced Heart Failure Discharge Note   Discharge Summary   Patient ID: Michele Owens MRN: [id number], DOB/AGE: 06/24/1960 55 y.o. Admit date: 06/23/2015 D/C date:     06/24/2015   Primary Discharge Diagnoses:  1. Chest pain 2. Chronic diastolic CHF 3. Palpitations 4. OSA 5. COPD  Hospital Course:   Michele Owens is a 55 y.o. female who presented for evaluation via L/RHC with a history HTN, Chronic diastolic CHF, obesity, OSA, COPD, hyperlipidemia, and SVT. She has recently been having CP with rest and exertion and lexiscan cardiolite with partially reversible inferolateral defect that could be suggestive of ischemia.  L/RHC with mildly elevated right and left heart filling pressures, mild pulmonary venous HTN, no significant coronary disease, and Normal LV function. Full results below.   Lasix increased from 40 mg po BID to 60 mg po BID.   She was kept overnight for observation as a safety precaution with lack of supervision overnight.  She has chronic migraines and was recently placed on Naproxen.  Told that she should stop Naproxen, NSAIDs not recommended in CV disease, and try tylenol. Should follow up with PCP as needed for migraine prophylaxis.  She will be discharged to home in stable condition with follow up as below.   General: Pleasant, NAD. No resp difficulty Psych: Normal affect. HEENT: Normal, without mass or lesion. Neck: Supple, no bruits or JVD. Carotids 2+. No lymphadenopathy/thyromegaly appreciated. Heart: PMI nondisplaced. RRR no s3, s4, or murmurs. Lungs: Resp regular and unlabored, CTA. Abdomen: Obese, soft, non-tender, non-distended, No HSM, BS + x 4.  Extremities: No clubbing, cyanosis or edema. DP/PT/Radials 2+ and equal bilaterally.  R radial site without bruising, hematoma, or bruit.  Neuro: Alert and oriented X 3. Moves all extremities spontaneously.  Discharge Weight Range: 289 lbs Discharge Vitals: Blood pressure 136/77, pulse 101,  temperature 98 F (36.7 C), temperature source Oral, resp. rate 18, height 5\' 6"  (1.676 m), weight 289 lb (131.09 kg), SpO2 91 %.  Labs: Lab Results  Component Value Date   WBC 8.5 06/23/2015   HGB 11.3* 06/23/2015   HCT 39.2 06/23/2015   MCV 83.8 06/23/2015   PLT 288 06/23/2015     Recent Labs Lab 06/23/15 0817 06/23/15 1002  NA 141 145  K 4.0 3.3*  CL 102 102  CO2 30  --   BUN 7 7  CREATININE 0.56 0.50  CALCIUM 9.8  --   GLUCOSE 105* 91   No results found for: CHOL, HDL, LDLCALC, TRIG BNP (last 3 results)  Recent Labs  12/07/14 1327 04/24/15 1557  BNP 97.3 111.3*    ProBNP (last 3 results) No results for input(s): PROBNP in the last 8760 hours.   Diagnostic Studies/Procedures   Left and Right Heart Cath and Coronary Angiography 06/23/15 Coronary Findings    Dominance: Right   Left Main  No significant disease.     Left Anterior Descending  Mild luminal irregularities.     Left Circumflex  No significant disease.     Right Coronary Artery  No significant disease.       Right Heart Pressures RHC Procedural Findings: Hemodynamics (mmHg) RA mean 8 RV 45/11 PA 45/20, mean 30 PCWP mean 20 LV 116/13 AO 118/75  Oxygen saturations: PA 68% AO 96%  Cardiac Output (Fick) 6.7  Cardiac Index (Fick) 2.86 PVR 1.5 WU     Discharge Medications     Medication List    STOP taking these medications        naproxen  500 MG tablet  Commonly known as:  NAPROSYN      TAKE these medications        acetaminophen 325 MG tablet  Commonly known as:  TYLENOL  Take 2 tablets (650 mg total) by mouth every 4 (four) hours as needed for headache or mild pain.     albuterol 108 (90 Base) MCG/ACT inhaler  Commonly known as:  PROVENTIL HFA;VENTOLIN HFA  Inhale 1-2 puffs into the lungs every 6 (six) hours as needed for wheezing.     clonazePAM 2 MG tablet  Commonly known as:  KLONOPIN  Take 2 mg by mouth 2 (two) times daily.     colchicine 0.6 MG  tablet  Take 0.6 mg by mouth 2 (two) times daily.     furosemide 20 MG tablet  Commonly known as:  LASIX  Take 3 tablets (60 mg total) by mouth 2 (two) times daily.     HYDROcodone-acetaminophen 5-325 MG tablet  Commonly known as:  NORCO/VICODIN  Take 1 tablet by mouth 2 (two) times daily as needed for moderate pain.     KLOR-CON M20 20 MEQ tablet  Generic drug:  potassium chloride SA  Take 20 mEq by mouth 2 (two) times daily.     loratadine 10 MG tablet  Commonly known as:  CLARITIN  Take 10 mg by mouth daily.     metFORMIN 500 MG tablet  Commonly known as:  GLUCOPHAGE  Take 1 tablet (500 mg total) by mouth 2 (two) times daily with a meal.     metoprolol succinate 50 MG 24 hr tablet  Commonly known as:  TOPROL-XL  Take 1 tablet (50 mg total) by mouth daily.     mirtazapine 15 MG tablet  Commonly known as:  REMERON  Take 15 mg by mouth at bedtime.     nicotine 21 mg/24hr patch  Commonly known as:  NICODERM CQ - dosed in mg/24 hours  apply 1 patch to clean dry skin every 24 hours     omeprazole 40 MG capsule  Commonly known as:  PRILOSEC  Take 40 mg by mouth daily.     pravastatin 40 MG tablet  Commonly known as:  PRAVACHOL  Take 40 mg by mouth daily.     SPIRIVA HANDIHALER 18 MCG inhalation capsule  Generic drug:  tiotropium  Place 1 capsule into inhaler and inhale daily.        Disposition   The patient will be discharged in stable condition to home. Discharge Instructions    Diet - low sodium heart healthy    Complete by:  As directed      Heart Failure patients record your daily weight using the same scale at the same time of day    Complete by:  As directed      Increase activity slowly    Complete by:  As directed           Follow-up Information    Follow up with Loralie Champagne, MD On 07/15/2015.   Specialty:  Cardiology   Why:  at 1200 for cath follow up. Please bring all of your medications to your visit. Call the office for the code for parking.     Contact information:   [place] [address] [phone]         Duration of Discharge Encounter: Greater than 35 minutes   Signed, Annamaria Helling 06/24/2015, 9:31 AM  Patient seen with PA, stable for  discharge today.  We will increase her Lasix to 60 mg po bid.

## 2015-06-25 ENCOUNTER — Encounter (HOSPITAL_COMMUNITY): Payer: Medicaid Other

## 2015-07-06 ENCOUNTER — Telehealth: Payer: Self-pay | Admitting: *Deleted

## 2015-07-06 ENCOUNTER — Ambulatory Visit (INDEPENDENT_AMBULATORY_CARE_PROVIDER_SITE_OTHER): Payer: Medicaid Other | Admitting: Podiatry

## 2015-07-06 ENCOUNTER — Encounter: Payer: Self-pay | Admitting: Podiatry

## 2015-07-06 DIAGNOSIS — M722 Plantar fascial fibromatosis: Secondary | ICD-10-CM | POA: Diagnosis not present

## 2015-07-06 NOTE — Telephone Encounter (Addendum)
Pt seen in office today, Dr. Milinda Pointer states pt is to be seen by Dr. Mcarthur Rossetti straight away, for cellulitis, and a yeast infection under her breast.  I called Dr. Rosario Adie office [phone], I spoke with Benjamine Mola and she stated pt is scheduled as a walk-in for today.  Pt states she knows and she would call the girl that dropped her off and for our office to call the dispatcher [phone] to inform she is going to Dr. Mcarthur Rossetti. [facility] Transporter states I will have to speak with Biomedical engineer for approval for the transport to Dr. Mcarthur Rossetti.  I called [employer] Service Transportation [phone] and was transferred to Ms [facility] voicemail, I left a message informing her we needed urgent transport to Dr. Mcarthur Rossetti for a diabetic pt with 2 areas of infection.  I called Water quality scientist, spoke with Baldo Ash, she states she is Restaurant manager, fast food a high priority message to Ms Gloriann Loan.  I spoke with Levada Dy [facility] [phone] and she said she would have her supervisor walk our request for urgent transportation to Ms [facility]ian[facility].  I checked [facility] lobby to inform pt I was still trying to get approval for the transportation, and pt had left  just before 500pm, I am not sure if pt left with Surveyor, minerals.

## 2015-07-06 NOTE — Progress Notes (Signed)
She presents today with chief complaint of a painful left heel. She's also complaining of a painful lesion on her abdomen left lower quadrant. She says she hasn't been feeling well and wonders if this could be the reason.  Objective: Vital signs are stable alert and oriented 3. Pulses are palpable. Neurologic sensorium is intact. Deep tendon reflexes are intact. She is pain on palpation medially continued of her left heel. She demonstrates her left lower quadrant of her abdomen to me where there is a considerable mass firm nonpulsatile it appears to be a boil with surrounding cellulitis.  Assessment: Plantar fasciitis left heel.  Plan: Injected her left heel today with Kenalog and local anesthetic recommended that she follow-up with her primary provider immediately.

## 2015-07-07 ENCOUNTER — Ambulatory Visit (HOSPITAL_COMMUNITY)
Admission: RE | Admit: 2015-07-07 | Discharge: 2015-07-07 | Disposition: A | Discharge status: Home / Self Care | Payer: Medicaid Other | Source: Ambulatory Visit | Attending: Cardiology | Admitting: Cardiology

## 2015-07-07 DIAGNOSIS — I5032 Chronic diastolic (congestive) heart failure: Secondary | ICD-10-CM | POA: Diagnosis not present

## 2015-07-07 LAB — PULMONARY FUNCTION TEST
DL/VA % PRED: 102 %
DL/VA: 5.19 ml/min/mmHg/L
DLCO COR: 15.97 ml/min/mmHg
DLCO cor % pred: 59 %
DLCO unc % pred: 55 %
DLCO unc: 14.83 ml/min/mmHg
FEF 25-75 Post: 0.97 L/sec
FEF 25-75 Pre: 0.92 L/sec
FEF2575-%CHANGE-POST: 4 %
FEF2575-%Pred-Post: 40 %
FEF2575-%Pred-Pre: 38 %
FEV1-%Change-Post: 3 %
FEV1-%PRED-PRE: 48 %
FEV1-%Pred-Post: 50 %
FEV1-PRE: 1.16 L
FEV1-Post: 1.2 L
FEV1FVC-%CHANGE-POST: 14 %
FEV1FVC-%Pred-Pre: 91 %
FEV6-%Change-Post: -7 %
FEV6-%PRED-PRE: 52 %
FEV6-%Pred-Post: 48 %
FEV6-POST: 1.43 L
FEV6-PRE: 1.54 L
FEV6FVC-%Change-Post: 0 %
FEV6FVC-%PRED-POST: 103 %
FEV6FVC-%PRED-PRE: 102 %
FVC-%CHANGE-POST: -10 %
FVC-%PRED-POST: 47 %
FVC-%PRED-PRE: 52 %
FVC-POST: 1.43 L
FVC-PRE: 1.59 L
POST FEV6/FVC RATIO: 100 %
PRE FEV6/FVC RATIO: 100 %
Post FEV1/FVC ratio: 84 %
Pre FEV1/FVC ratio: 73 %
RV % PRED: 56 %
RV: 1.12 L
TLC % PRED: 57 %
TLC: 3.06 L

## 2015-07-07 MED ORDER — ALBUTEROL SULFATE (2.5 MG/3ML) 0.083% IN NEBU
2.5000 mg | INHALATION_SOLUTION | Freq: Once | RESPIRATORY_TRACT | Status: AC
  Administered 2015-07-07: 2.5 mg via RESPIRATORY_TRACT

## 2015-07-14 ENCOUNTER — Ambulatory Visit (HOSPITAL_COMMUNITY)
Admission: EM | Admit: 2015-07-14 | Discharge: 2015-07-14 | Disposition: A | Discharge status: Home / Self Care | Payer: Medicaid Other | Attending: Family Medicine | Admitting: Family Medicine

## 2015-07-14 ENCOUNTER — Encounter (HOSPITAL_COMMUNITY): Payer: Self-pay | Admitting: Emergency Medicine

## 2015-07-14 ENCOUNTER — Ambulatory Visit (INDEPENDENT_AMBULATORY_CARE_PROVIDER_SITE_OTHER): Payer: Medicaid Other

## 2015-07-14 DIAGNOSIS — M25522 Pain in left elbow: Secondary | ICD-10-CM

## 2015-07-14 DIAGNOSIS — M7712 Lateral epicondylitis, left elbow: Secondary | ICD-10-CM

## 2015-07-14 MED ORDER — DICLOFENAC SODIUM 1 % TD GEL: 2.0000 g | Freq: Four times a day (QID) | TRANSDERMAL | Status: DC

## 2015-07-14 NOTE — ED Provider Notes (Addendum)
CSN: [id number]     Arrival date & time 07/14/15  1759 History   First MD Initiated Contact with Patient 07/14/15 1940     Chief Complaint  Patient presents with  . Insect Bite   (Consider location/radiation/quality/duration/timing/severity/associated sxs/prior Treatment) The history is provided by the patient. No language interpreter was used.   Patient presents with complaint of L elbow swelling and pain since Monday April 3rd; was replanting a plant when she noticed swelling.  Has had pain in the elbow since then. No trauma to the elbow now or in the past.  Has tried heat pack and ice, neither of which helped.  Is unable to take ibuprofen due to rash; Tramadol causes her to swell.   Patient with history DM; A1C in August 2016 was <7% according to Epic records.  Past Medical History  Diagnosis Date  . COPD (chronic obstructive pulmonary disease) ([place])   . Diabetes mellitus without complication ([place])   . Chronic diastolic CHF (congestive heart failure) ([place])   . History of nuclear stress test     Myoview 2/17:  Low risk stress nuclear study with a small, moderate intensity, partially reversible inferior lateral defect consistent with small prior infarct and minimal peri-infarct ischemia; EF 68 with normal wall motion   Past Surgical History  Procedure Laterality Date  . Bunionectomy Bilateral   . Tonsillectomy    . Total abdominal hysterectomy  07/14/10  . Cardiac catheterization N/A 06/23/2015    Procedure: Right/Left Heart Cath and Coronary Angiography;  Surgeon: [name], MD;  Location: [place] [facility];  Service: Cardiovascular;  Laterality: N/A;   Family History  Problem Relation Age of Onset  . Cancer Father    Social History  Substance Use Topics  . Smoking status: Current Some Day Smoker -- 1.00 packs/day for 41 years    Types: Cigarettes  . Smokeless tobacco: Never Used  . Alcohol Use: 0.0 oz/week    0 Standard drinks or equivalent per week     Comment:  12/07/2014 "might have a beer a couple times/month"   OB History    No data available     Review of Systems  Constitutional: Negative for fever, chills, diaphoresis, appetite change and fatigue.    Allergies  Lamisil and Ibuprofen  Home Medications   Prior to Admission medications   Medication Sig Start Date End Date Taking? Authorizing Provider  acetaminophen (TYLENOL) 325 MG tablet Take 2 tablets (650 mg total) by mouth every 4 (four) hours as needed for headache or mild pain. 06/24/15  Yes Shirley Friar, PA-C  albuterol (PROVENTIL HFA;VENTOLIN HFA) 108 (90 BASE) MCG/ACT inhaler Inhale 1-2 puffs into the lungs every 6 (six) hours as needed for wheezing. 04/09/12  Yes Billy Fischer, MD  clonazePAM (KLONOPIN) 2 MG tablet Take 2 mg by mouth 2 (two) times daily.   Yes Historical Provider, MD  colchicine 0.6 MG tablet Take 0.6 mg by mouth 2 (two) times daily.   Yes Historical Provider, MD  furosemide (LASIX) 20 MG tablet Take 3 tablets (60 mg total) by mouth 2 (two) times daily. 06/24/15  Yes Shirley Friar, PA-C  HYDROcodone-acetaminophen (NORCO/VICODIN) 5-325 MG per tablet Take 1 tablet by mouth 2 (two) times daily as needed for moderate pain.   Yes Historical Provider, MD  KLOR-CON M20 20 MEQ tablet Take 20 mEq by mouth 2 (two) times daily. 05/26/15  Yes Historical Provider, MD  loratadine (CLARITIN) 10 MG tablet Take 10 mg by mouth  daily.   Yes Historical Provider, MD  metFORMIN (GLUCOPHAGE) 500 MG tablet Take 1 tablet (500 mg total) by mouth 2 (two) times daily with a meal. 12/11/14  Yes [name], MD  metoprolol succinate (TOPROL-XL) 50 MG 24 hr tablet Take 1 tablet (50 mg total) by mouth daily. 03/19/12  Yes Scott T Kathlen Mody, PA-C  mirtazapine (REMERON) 15 MG tablet Take 15 mg by mouth at bedtime. 11/16/14  Yes Historical Provider, MD  nicotine (NICODERM CQ - DOSED IN MG/24 HOURS) 21 mg/24hr patch apply 1 patch to clean dry skin every 24 hours 12/22/14  Yes Historical  Provider, MD  omeprazole (PRILOSEC) 40 MG capsule Take 40 mg by mouth daily. 04/13/15  Yes Historical Provider, MD  pravastatin (PRAVACHOL) 40 MG tablet Take 40 mg by mouth daily.  03/29/15  Yes Historical Provider, MD  SPIRIVA HANDIHALER 18 MCG inhalation capsule Place 1 capsule into inhaler and inhale daily.  03/12/15  Yes Historical Provider, MD   Meds Ordered and Administered this Visit  Medications - No data to display  BP 120/79 mmHg  Pulse 87  Temp(Src) 98.3 F (36.8 C) (Oral)  Resp 20  SpO2 95% No data found.   Physical Exam  Constitutional: She appears well-developed and well-nourished. No distress.  Patient becomes hostile during routine questions about history. No apparent distress.   Musculoskeletal:  L elbow with full active and passive flexion and extension.  Area of soft tissue swelling without puncture mark, erythema or calor along distal triceps above the L elbow to the area of the lateral epicondyle.>Area of swelling measures approximately 6x8cm and is tender.  Nonfluctuant.   Palpable L radial pulse; full intact handgrip.  Brisk cap refill in distal digits of both hands.   Skin: She is not diaphoretic.    ED Course  Procedures (including critical care time)  Labs Review Labs Reviewed - No data to display  Imaging Review No results found.   Visual Acuity Review  Right Eye Distance:   Left Eye Distance:   Bilateral Distance:    Right Eye Near:   Left Eye Near:    Bilateral Near:         MDM   1. Left elbow pain    Suspect lateral epicondylitis.  Patient has XR done in [facility] at her request, denies any form of even minor trauma to the L elbow. Xray reviewed by me, negative for fracture or dislocation.   Patient is unable to take oral NSAIDs or Tramadol.  Has tried cold and heat compresses without apparent relief.  Tennis elbow strap, topical Voltaren gel, follow up with primary doctor.   Dalbert Mayotte, MD    Willeen Niece, MD 07/14/15  2008  Willeen Niece, MD 07/14/15 2009  Willeen Niece, MD 07/14/15 2018

## 2015-07-14 NOTE — Discharge Instructions (Signed)
It is a pleasure to see you today. I believe you have a case of "tennis elbow".  I am prescribing you a topical gel, Voltaren, to avoid the effects of oral medications.  Avoid overuse the elbow.   You can use a "tennis elbow strap" across the forearm to help with the pain from this condition.  Follow up with Dr Jonelle Sidle if not improving.    Cryotherapy Cryotherapy means treatment with cold. Ice or gel packs can be used to reduce both pain and swelling. Ice is the most helpful within the first 24 to 48 hours after an injury or flare-up from overusing a muscle or joint. Sprains, strains, spasms, burning pain, shooting pain, and aches can all be eased with ice. Ice can also be used when recovering from surgery. Ice is effective, has very few side effects, and is safe for most people to use. PRECAUTIONS  Ice is not a safe treatment option for people with:  Raynaud phenomenon. This is a condition affecting small blood vessels in the extremities. Exposure to cold may cause your problems to return.  Cold hypersensitivity. There are many forms of cold hypersensitivity, including:  Cold urticaria. Red, itchy hives appear on the skin when the tissues begin to warm after being iced.  Cold erythema. This is a red, itchy rash caused by exposure to cold.  Cold hemoglobinuria. Red blood cells break down when the tissues begin to warm after being iced. The hemoglobin that carry oxygen are passed into the urine because they cannot combine with blood proteins fast enough.  Numbness or altered sensitivity in the area being iced. If you have any of the following conditions, do not use ice until you have discussed cryotherapy with your caregiver:  Heart conditions, such as arrhythmia, angina, or chronic heart disease.  High blood pressure.  Healing wounds or open skin in the area being iced.  Current infections.  Rheumatoid arthritis.  Poor circulation.  Diabetes. Ice slows the blood flow in the  region it is applied. This is beneficial when trying to stop inflamed tissues from spreading irritating chemicals to surrounding tissues. However, if you expose your skin to cold temperatures for too long or without the proper protection, you can damage your skin or nerves. Watch for signs of skin damage due to cold. HOME CARE INSTRUCTIONS Follow these tips to use ice and cold packs safely.  Place a dry or damp towel between the ice and skin. A damp towel will cool the skin more quickly, so you may need to shorten the time that the ice is used.  For a more rapid response, add gentle compression to the ice.  Ice for no more than 10 to 20 minutes at a time. The bonier the area you are icing, the less time it will take to get the benefits of ice.  Check your skin after 5 minutes to make sure there are no signs of a poor response to cold or skin damage.  Rest 20 minutes or more between uses.  Once your skin is numb, you can end your treatment. You can test numbness by very lightly touching your skin. The touch should be so light that you do not see the skin dimple from the pressure of your fingertip. When using ice, most people will feel these normal sensations in this order: cold, burning, aching, and numbness.  Do not use ice on someone who cannot communicate their responses to pain, such as small children or people with dementia. HOW TO  MAKE AN ICE PACK Ice packs are the most common way to use ice therapy. Other methods include ice massage, ice baths, and cryosprays. Muscle creams that cause a cold, tingly feeling do not offer the same benefits that ice offers and should not be used as a substitute unless recommended by your caregiver. To make an ice pack, do one of the following:  Place crushed ice or a bag of frozen vegetables in a sealable plastic bag. Squeeze out the excess air. Place this bag inside another plastic bag. Slide the bag into a pillowcase or place a damp towel between your skin  and the bag.  Mix 3 parts water with 1 part rubbing alcohol. Freeze the mixture in a sealable plastic bag. When you remove the mixture from the freezer, it will be slushy. Squeeze out the excess air. Place this bag inside another plastic bag. Slide the bag into a pillowcase or place a damp towel between your skin and the bag. SEEK MEDICAL CARE IF:  You develop white spots on your skin. This may give the skin a blotchy (mottled) appearance.  Your skin turns blue or pale.  Your skin becomes waxy or hard.  Your swelling gets worse. MAKE SURE YOU:   Understand these instructions.  Will watch your condition.  Will get help right away if you are not doing well or get worse.   This information is not intended to replace advice given to you by your health care provider. Make sure you discuss any questions you have with your health care provider.   Document Released: 11/21/2010 Document Revised: 04/17/2014 Document Reviewed: 11/21/2010 Elsevier Interactive Patient Education [employer].

## 2015-07-14 NOTE — ED Notes (Signed)
The patient presented to the [facility] with a complaint of a possible insect bite on the left arm just above the elbow that occurred 3 days ago.

## 2015-07-15 ENCOUNTER — Encounter (HOSPITAL_COMMUNITY): Payer: Medicaid Other

## 2015-07-21 ENCOUNTER — Telehealth: Payer: Self-pay | Admitting: Cardiology

## 2015-07-21 NOTE — Telephone Encounter (Signed)
Michele Owens calling from PCP- Dr.Garba wanting to verify that Voltaren gel was OK for her to take.  The ED physician Dr. Lindell Noe gave her Rx on 4/5 when she was seen in ED.  There is not any contraindications for using per Dr. Jonelle Sidle.

## 2015-07-21 NOTE — Telephone Encounter (Signed)
New Message:   Please call,concerning her medication.

## 2015-08-03 ENCOUNTER — Ambulatory Visit: Payer: Medicaid Other | Admitting: Podiatry

## 2015-08-06 ENCOUNTER — Ambulatory Visit: Payer: Medicaid Other | Admitting: Adult Health

## 2015-08-09 ENCOUNTER — Emergency Department (HOSPITAL_COMMUNITY)
Admission: EM | Admit: 2015-08-09 | Discharge: 2015-08-09 | Disposition: A | Discharge status: Home / Self Care | Payer: Medicaid Other | Attending: Emergency Medicine | Admitting: Emergency Medicine

## 2015-08-09 ENCOUNTER — Emergency Department (HOSPITAL_COMMUNITY): Payer: Medicaid Other

## 2015-08-09 ENCOUNTER — Encounter (HOSPITAL_COMMUNITY): Payer: Self-pay | Admitting: Emergency Medicine

## 2015-08-09 DIAGNOSIS — Z9889 Other specified postprocedural states: Secondary | ICD-10-CM | POA: Diagnosis not present

## 2015-08-09 DIAGNOSIS — Z791 Long term (current) use of non-steroidal anti-inflammatories (NSAID): Secondary | ICD-10-CM | POA: Insufficient documentation

## 2015-08-09 DIAGNOSIS — Z7952 Long term (current) use of systemic steroids: Secondary | ICD-10-CM | POA: Diagnosis not present

## 2015-08-09 DIAGNOSIS — M1990 Unspecified osteoarthritis, unspecified site: Secondary | ICD-10-CM | POA: Diagnosis not present

## 2015-08-09 DIAGNOSIS — I5032 Chronic diastolic (congestive) heart failure: Secondary | ICD-10-CM | POA: Diagnosis not present

## 2015-08-09 DIAGNOSIS — F17210 Nicotine dependence, cigarettes, uncomplicated: Secondary | ICD-10-CM | POA: Insufficient documentation

## 2015-08-09 DIAGNOSIS — R0789 Other chest pain: Secondary | ICD-10-CM | POA: Diagnosis not present

## 2015-08-09 DIAGNOSIS — J441 Chronic obstructive pulmonary disease with (acute) exacerbation: Secondary | ICD-10-CM | POA: Diagnosis not present

## 2015-08-09 DIAGNOSIS — Z7984 Long term (current) use of oral hypoglycemic drugs: Secondary | ICD-10-CM | POA: Insufficient documentation

## 2015-08-09 DIAGNOSIS — E119 Type 2 diabetes mellitus without complications: Secondary | ICD-10-CM | POA: Insufficient documentation

## 2015-08-09 DIAGNOSIS — R079 Chest pain, unspecified: Secondary | ICD-10-CM | POA: Diagnosis present

## 2015-08-09 DIAGNOSIS — Z79899 Other long term (current) drug therapy: Secondary | ICD-10-CM | POA: Diagnosis not present

## 2015-08-09 LAB — I-STAT TROPONIN, ED: Troponin i, poc: 0 ng/mL (ref 0.00–0.08)

## 2015-08-09 LAB — BASIC METABOLIC PANEL
ANION GAP: 11 (ref 5–15)
BUN: 13 mg/dL (ref 6–20)
CALCIUM: 9.7 mg/dL (ref 8.9–10.3)
CO2: 28 mmol/L (ref 22–32)
Chloride: 99 mmol/L — ABNORMAL LOW (ref 101–111)
Creatinine, Ser: 0.7 mg/dL (ref 0.44–1.00)
GFR calc non Af Amer: >60 mL/min (ref >60)
Glucose, Bld: 102 mg/dL — ABNORMAL HIGH (ref 65–99)
POTASSIUM: 3.7 mmol/L (ref 3.5–5.1)
Sodium: 138 mmol/L (ref 135–145)

## 2015-08-09 LAB — CBC WITH DIFFERENTIAL/PLATELET
BASOS ABS: 0 10*3/uL (ref 0.0–0.1)
BASOS PCT: 0 %
Eosinophils Absolute: 0.1 10*3/uL (ref 0.0–0.7)
Eosinophils Relative: 1 %
HEMATOCRIT: 40.8 % (ref 36.0–46.0)
HEMOGLOBIN: 12.4 g/dL (ref 12.0–15.0)
Lymphocytes Relative: 20 %
Lymphs Abs: 2.3 10*3/uL (ref 0.7–4.0)
MCH: 24.9 pg — ABNORMAL LOW (ref 26.0–34.0)
MCHC: 30.4 g/dL (ref 30.0–36.0)
MCV: 81.9 fL (ref 78.0–100.0)
Monocytes Absolute: 0.9 10*3/uL (ref 0.1–1.0)
Monocytes Relative: 8 %
NEUTROS ABS: 8 10*3/uL — AB (ref 1.7–7.7)
NEUTROS PCT: 71 %
Platelets: 313 10*3/uL (ref 150–400)
RBC: 4.98 MIL/uL (ref 3.87–5.11)
RDW: 17.5 % — AB (ref 11.5–15.5)
WBC: 11.3 10*3/uL — AB (ref 4.0–10.5)

## 2015-08-09 LAB — BRAIN NATRIURETIC PEPTIDE: B NATRIURETIC PEPTIDE 5: 6.6 pg/mL (ref 0.0–100.0)

## 2015-08-09 MED ORDER — METHOCARBAMOL 500 MG PO TABS
1000.0000 mg | ORAL_TABLET | Freq: Once | ORAL | Status: AC
  Administered 2015-08-09: 1000 mg via ORAL
  Filled 2015-08-09: qty 2

## 2015-08-09 MED ORDER — KETOROLAC TROMETHAMINE 30 MG/ML IJ SOLN
30.0000 mg | Freq: Once | INTRAMUSCULAR | Status: AC
  Administered 2015-08-09: 30 mg via INTRAVENOUS
  Filled 2015-08-09: qty 1

## 2015-08-09 MED ORDER — IPRATROPIUM-ALBUTEROL 0.5-2.5 (3) MG/3ML IN SOLN
3.0000 mL | RESPIRATORY_TRACT | Status: DC | PRN
Start: 2015-08-09 — End: 2015-08-09

## 2015-08-09 MED ORDER — ASPIRIN 81 MG PO CHEW
324.0000 mg | CHEWABLE_TABLET | Freq: Once | ORAL | Status: AC
  Administered 2015-08-09: 324 mg via ORAL
  Filled 2015-08-09: qty 4

## 2015-08-09 NOTE — Discharge Instructions (Signed)
Chest Wall Pain °Chest wall pain is pain in or around the bones and muscles of your chest. Sometimes, an injury causes this pain. Sometimes, the cause may not be known. This pain may take several weeks or longer to get better. °HOME CARE °Pay attention to any changes in your symptoms. Take these actions to help with your pain: °· Rest as told by your doctor. °· Avoid activities that cause pain. Try not to use your chest, belly (abdominal), or side muscles to lift heavy things. °· If directed, apply ice to the painful area: °¨ Put ice in a plastic bag. °¨ Place a towel between your skin and the bag. °¨ Leave the ice on for 20 minutes, 2-3 times per day. °· Take over-the-counter and prescription medicines only as told by your doctor. °· Do not use tobacco products, including cigarettes, chewing tobacco, and e-cigarettes. If you need help quitting, ask your doctor. °· Keep all follow-up visits as told by your doctor. This is important. °GET HELP IF: °· You have a fever. °· Your chest pain gets worse. °· You have new symptoms. °GET HELP RIGHT AWAY IF: °· You feel sick to your stomach (nauseous) or you throw up (vomit). °· You feel sweaty or light-headed. °· You have a cough with phlegm (sputum) or you cough up blood. °· You are short of breath. °  °This information is not intended to replace advice given to you by your health care provider. Make sure you discuss any questions you have with your health care provider. °  °Document Released: 09/13/2007 Document Revised: 12/16/2014 Document Reviewed: 06/22/2014 °Elsevier Interactive Patient Education ©2016 Else[employer]. ° °

## 2015-08-09 NOTE — ED Notes (Signed)
Pt given 1/3 cup of ice chips and warm blanket.

## 2015-08-09 NOTE — ED Provider Notes (Signed)
CSN: [id number]     Arrival date & time 08/09/15  1615 History   First MD Initiated Contact with Patient 08/09/15 1621     Chief Complaint  Patient presents with  . Chest Pain     (Consider location/radiation/quality/duration/timing/severity/associated sxs/prior Treatment) HPI 55 y.o. female with a hx of COPD, DM, CHF, with recent NM stress test in 2/17 which was a low risk study with reassuring EF and wall motion, presents to the ED by EMS noting an 8 day history of right sided chest pain that is sharp, intermittent and significantly worse with palpation and movement. She denies any chest tightness or pressure. She denies any significant radiation of her sx stating that they are predominantly in the right pectoral region. She denies any significant change in her acitivity level to precipitate injury. She states that she has been taking her normally rx'd medications including a new one for arthritis, diclofenac which she states does slightly improve her pain, though transiently. She denies any direct trauma. Denies any fever, chills, cough, SOB, N, V, D, Abd pain. She requests ginger ale multiple times during the interview and afterward to multiple staff members.   Past Medical History  Diagnosis Date  . COPD (chronic obstructive pulmonary disease) ([place])   . Diabetes mellitus without complication ([place])   . Chronic diastolic CHF (congestive heart failure) ([place])   . History of nuclear stress test     Myoview 2/17:  Low risk stress nuclear study with a small, moderate intensity, partially reversible inferior lateral defect consistent with small prior infarct and minimal peri-infarct ischemia; EF 68 with normal wall motion   Past Surgical History  Procedure Laterality Date  . Bunionectomy Bilateral   . Tonsillectomy    . Total abdominal hysterectomy  07/14/10  . Cardiac catheterization N/A 06/23/2015    Procedure: Right/Left Heart Cath and Coronary Angiography;  Surgeon: [name], MD;   Location: [facility];  Service: Cardiovascular;  Laterality: N/A;   Family History  Problem Relation Age of Onset  . Cancer Father    Social History  Substance Use Topics  . Smoking status: Current Some Day Smoker -- 1.00 packs/day for 41 years    Types: Cigarettes  . Smokeless tobacco: Never Used  . Alcohol Use: 0.0 oz/week    0 Standard drinks or equivalent per week     Comment: 12/07/2014 "might have a beer a couple times/month"   OB History    No data available     Review of Systems  Constitutional: Positive for activity change. Negative for fever, chills and diaphoresis.  Respiratory: Negative for cough, chest tightness, shortness of breath and wheezing.   Cardiovascular: Positive for chest pain. Negative for palpitations and leg swelling.  Gastrointestinal: Negative for nausea, vomiting, abdominal pain, constipation and blood in stool.  Musculoskeletal: Negative for back pain and neck pain.  Skin: Negative for rash and wound.  Neurological: Negative for syncope, light-headedness and headaches.  All other systems reviewed and are negative.     Allergies  Lamisil and Ibuprofen  Home Medications   Prior to Admission medications   Medication Sig Start Date End Date Taking? Authorizing Provider  acetaminophen (TYLENOL) 325 MG tablet Take 2 tablets (650 mg total) by mouth every 4 (four) hours as needed for headache or mild pain. 06/24/15  Yes Shirley Friar, PA-C  albuterol (PROVENTIL HFA;VENTOLIN HFA) 108 (90 BASE) MCG/ACT inhaler Inhale 1-2 puffs into the lungs every 6 (six) hours as needed for wheezing.  04/09/12  Yes Billy Fischer, MD  albuterol (PROVENTIL) (2.5 MG/3ML) 0.083% nebulizer solution Take 2.5 mg by nebulization every 6 (six) hours as needed for wheezing or shortness of breath.   Yes Historical Provider, MD  clonazePAM (KLONOPIN) 2 MG tablet Take 2 mg by mouth 2 (two) times daily.   Yes Historical Provider, MD  colchicine 0.6 MG tablet Take 0.6  mg by mouth 2 (two) times daily.   Yes Historical Provider, MD  diclofenac (VOLTAREN) 75 MG EC tablet Take 75 mg by mouth 2 (two) times daily with a meal. 07/31/15  Yes Historical Provider, MD  diclofenac sodium (VOLTAREN) 1 % GEL Apply 2 g topically 4 (four) times daily. 07/14/15  Yes Willeen Niece, MD  furosemide (LASIX) 40 MG tablet Take 60 mg by mouth 2 (two) times daily. 06/23/15  Yes Historical Provider, MD  HYDROcodone-acetaminophen (NORCO/VICODIN) 5-325 MG per tablet Take 1 tablet by mouth 2 (two) times daily as needed for moderate pain.   Yes Historical Provider, MD  loratadine (CLARITIN) 10 MG tablet Take 10 mg by mouth daily.   Yes Historical Provider, MD  metFORMIN (GLUCOPHAGE) 500 MG tablet Take 1 tablet (500 mg total) by mouth 2 (two) times daily with a meal. 12/11/14  Yes [name], MD  metoprolol succinate (TOPROL-XL) 50 MG 24 hr tablet Take 1 tablet (50 mg total) by mouth daily. 03/19/12  Yes Scott T Kathlen Mody, PA-C  mirtazapine (REMERON) 15 MG tablet Take 15 mg by mouth at bedtime. 11/16/14  Yes Historical Provider, MD  nicotine (NICODERM CQ - DOSED IN MG/24 HOURS) 21 mg/24hr patch Place 21 mg onto the skin daily. Apply patch to clean dry skin every 24 hours   Yes Historical Provider, MD  nystatin cream (MYCOSTATIN) Apply 1 application topically 2 (two) times daily. 07/30/15  Yes Historical Provider, MD  omeprazole (PRILOSEC) 40 MG capsule Take 40 mg by mouth daily. 04/13/15  Yes Historical Provider, MD  potassium chloride SA (K-DUR,KLOR-CON) 20 MEQ tablet Take 20 mEq by mouth daily.   Yes Historical Provider, MD  pravastatin (PRAVACHOL) 40 MG tablet Take 40 mg by mouth daily.  03/29/15  Yes Historical Provider, MD  tiotropium (SPIRIVA) 18 MCG inhalation capsule Place 18 mcg into inhaler and inhale daily.   Yes Historical Provider, MD  triamcinolone ointment (KENALOG) 0.1 % Apply 1 application topically 2 (two) times daily. 07/24/15  Yes Historical Provider, MD  furosemide (LASIX) 20 MG  tablet Take 3 tablets (60 mg total) by mouth 2 (two) times daily. Patient not taking: Reported on 08/09/2015 06/24/15   Satira Mccallum Tillery, PA-C   BP 114/59 mmHg  Pulse 89  Temp(Src) 98.3 F (36.8 C) (Oral)  Resp 22  SpO2 94% Physical Exam  Constitutional: She is oriented to person, place, and time. She appears well-developed and well-nourished. No distress.  HENT:  Head: Normocephalic and atraumatic.  Nose: Nose normal.  Mouth/Throat: Oropharynx is clear and moist.  Eyes: Conjunctivae and EOM are normal. Pupils are equal, round, and reactive to light.  Neck: Neck supple.  Cardiovascular: Normal rate, regular rhythm, normal heart sounds and intact distal pulses.   Pulmonary/Chest: Effort normal. She has wheezes (scattered, faint). She has no rales. She exhibits tenderness. She exhibits no laceration and no crepitus.    Abdominal: Soft. She exhibits no distension. There is no tenderness.  Musculoskeletal: She exhibits no edema or tenderness.  Neurological: She is alert and oriented to person, place, and time. No cranial nerve deficit. Coordination normal.  Skin: Skin is warm and dry. No rash noted. She is not diaphoretic.  Nursing note and vitals reviewed.   ED Course  Procedures (including critical care time) Labs Review Labs Reviewed  CBC WITH DIFFERENTIAL/PLATELET - Abnormal; Notable for the following:    WBC 11.3 (*)    MCH 24.9 (*)    RDW 17.5 (*)    Neutro Abs 8.0 (*)    All other components within normal limits  BASIC METABOLIC PANEL - Abnormal; Notable for the following:    Chloride 99 (*)    Glucose, Bld 102 (*)    All other components within normal limits  BRAIN NATRIURETIC PEPTIDE  I-STAT TROPOININ, ED    Imaging Review Dg Chest 2 View  08/09/2015  CLINICAL DATA:  Chest pain for 7 days; hypertension EXAM: CHEST  2 VIEW COMPARISON:  May 28, 2015 FINDINGS: There is mild bibasilar atelectasis. Lungs elsewhere are clear. Heart size and pulmonary vascularity  are normal. No adenopathy. There is degenerative change in the thoracic spine. No pneumothorax. IMPRESSION: Mild bibasilar atelectatic change.  No edema or consolidation. Electronically Signed   By: Lowella Grip III M.D.   On: 08/09/2015 18:00   I have personally reviewed and evaluated these images and lab results as part of my medical decision-making.   EKG Interpretation   Date/Time:  Monday Aug 09 2015 16:19:43 EDT Ventricular Rate:  86 PR Interval:  166 QRS Duration: 78 QT Interval:  362 QTC Calculation: 433 R Axis:   49 Text Interpretation:  Sinus rhythm Normal ECG Confirmed by KNOTT MD,  Quillian Quince [id number]) on 08/09/2015 6:04:53 PM      MDM  55 y.o. female with a hx of DM, CHF presents to the ED noting an 8 day history of right sided sharp chest pain associated with movement and palpation. She states that she has been taking diclofenac for arthritis in her knees. Physical exam, as above with markedly reproducible chest pain with movement and palpation. No evidence of fluid overload. Labs were drawn and were reassuring with a negative troponin, normal BNP, normal electrolytes and very mild leukocytosis of 11.3. CXR negative for acute abnormality. Given symptomatic treatment and had moderate improvement in her sx. The patient requests to be discharged after her labs returned so reassuringly. On further review of her records she was found to have a nuclear stress test in 2/17 which shows low risk, mild findings, with normal EF and wall motion. Given this feel that the patient is safe to be discharged home, as she requests, with close follow up with her PCP and cardiologist. Feel that her sx are very likely MSK chest pain. Her results were discussed with the patient at the bedside and she was discharged home in stable condition.   Final diagnoses:  Right-sided chest wall pain        Zenovia Jarred, DO 08/10/15 2229  Leo Grosser, MD 08/11/15 [phone]

## 2015-08-09 NOTE — ED Notes (Signed)
Per EMS, pt coming in for right sided CP which started 8 days ago. Pt believes it is related to a new medicine she is taking. "Diclopinate". Pain is worse on palpation. NAD at this time. Pt alert x4.

## 2015-08-24 ENCOUNTER — Ambulatory Visit (HOSPITAL_BASED_OUTPATIENT_CLINIC_OR_DEPARTMENT_OTHER): Payer: Medicaid Other | Attending: Pulmonary Disease | Admitting: Pulmonary Disease

## 2015-08-24 VITALS — Ht 66.0 in | Wt 287.0 lb

## 2015-08-24 DIAGNOSIS — E662 Morbid (severe) obesity with alveolar hypoventilation: Secondary | ICD-10-CM | POA: Diagnosis not present

## 2015-08-24 DIAGNOSIS — R0902 Hypoxemia: Secondary | ICD-10-CM | POA: Insufficient documentation

## 2015-08-24 DIAGNOSIS — J9611 Chronic respiratory failure with hypoxia: Secondary | ICD-10-CM | POA: Insufficient documentation

## 2015-08-24 DIAGNOSIS — G4733 Obstructive sleep apnea (adult) (pediatric): Secondary | ICD-10-CM | POA: Insufficient documentation

## 2015-08-26 ENCOUNTER — Telehealth: Payer: Self-pay | Admitting: Pulmonary Disease

## 2015-08-26 DIAGNOSIS — E662 Morbid (severe) obesity with alveolar hypoventilation: Secondary | ICD-10-CM | POA: Insufficient documentation

## 2015-08-26 DIAGNOSIS — J9621 Acute and chronic respiratory failure with hypoxia: Secondary | ICD-10-CM | POA: Insufficient documentation

## 2015-08-26 NOTE — Telephone Encounter (Signed)
PSG 08/24/15 >> AHI 3.6, SpO2 low 80%.  Spent 13.6 min with SpO2 < 88%.  Needed 2 liters supplemental oxygen.   Will have my nurse inform pt that sleep study didn't show sleep apnea, but her oxygen level got low at night.  She needs to use 2 liters oxygen while asleep >> please send order to set this up.  She needs ROV with me or Nurse Practitioner to review results in more detail.

## 2015-08-26 NOTE — Procedures (Signed)
Patient Name: Michele Owens, Michele Owens Date: 08/24/2015 Gender: Female D.O.B: March 05, 1961 Age (years): 57 Referring Provider: Chesley Mires MD, ABSM Height (inches): 95 Interpreting Physician: Chesley Mires MD, ABSM Weight (lbs): 287 RPSGT: Zadie Rhine BMI: 46 MRN: [id number] Neck Size: 16.00  CLINICAL INFORMATION Sleep Study Type: NPSG Indication for sleep study: OSA, Snoring Epworth Sleepiness Score: 17  SLEEP STUDY TECHNIQUE As per the AASM Manual for the Scoring of Sleep and Associated Events v2.3 (April 2016) with a hypopnea requiring 4% desaturations. The channels recorded and monitored were frontal, central and occipital EEG, electrooculogram (EOG), submentalis EMG (chin), nasal and oral airflow, thoracic and abdominal wall motion, anterior tibialis EMG, snore microphone, electrocardiogram, and pulse oximetry.  MEDICATIONS Patient's medications include: NORCO/VICODIN, REMERON, TIOTROPIUM BROMIDE, VOLTAREN GEL, NYSTATIN CREAM. Medications self-administered by patient during sleep study : Sleep medicine administered - REMERON at 11:00:15 PM  SLEEP ARCHITECTURE The study was initiated at 11:00:38 PM and ended at 5:11:47 AM. Sleep onset time was 5.9 minutes and the sleep efficiency was 93.9%. The total sleep time was 348.5 minutes. Stage REM latency was 100.0 minutes. The patient spent 1.72% of the night in stage N1 sleep, 50.50% in stage N2 sleep, 0.00% in stage N3 and 47.78% in REM. Alpha intrusion was absent. Supine sleep was 0.00%.  RESPIRATORY PARAMETERS The overall apnea/hypopnea index (AHI) was 3.6 per hour. There were 13 total apneas, including 13 obstructive, 0 central and 0 mixed apneas. There were 8 hypopneas and 6 RERAs. The AHI during Stage REM sleep was 6.1 per hour. AHI while supine was N/A per hour. The mean oxygen saturation was 94.27%. The minimum SpO2 during sleep was 80.00%. Loud snoring was noted during this study.  CARDIAC DATA The 2 lead EKG demonstrated  sinus rhythm. The mean heart rate was 83.27 beats per minute. Other EKG findings include: None.  LEG MOVEMENT DATA The total PLMS were 0 with a resulting PLMS index of 0.00. Associated arousal with leg movement index was 0.0 .  IMPRESSIONS While she did have several respiratory events, these were not frequent enough to qualify for diagnosis of obstructive sleep apnea.  Her AHI was 3.6.  Her SpO2 low was 80%.  She spent 13.6 minutes with SpO2 < 88%.  She had improved oxygenation with addition of 2 liters supplemental oxygen.  DIAGNOSIS Obesity hypoventilation syndrome (E66.2) Chronic respiratory failure hypoxia (J96.11)   RECOMMENDATIONS - Weight loss. - 2 liters supplemental oxygen while asleep.   Chesley Mires, MD, [place], American Board of Sleep Medicine 08/26/2015, 4:26 PM  NPI: [id number]

## 2015-08-30 ENCOUNTER — Encounter (HOSPITAL_COMMUNITY): Payer: Self-pay

## 2015-08-30 ENCOUNTER — Ambulatory Visit (HOSPITAL_COMMUNITY)
Admission: RE | Admit: 2015-08-30 | Discharge: 2015-08-30 | Disposition: A | Discharge status: Home / Self Care | Payer: Medicaid Other | Source: Ambulatory Visit | Attending: Cardiology | Admitting: Cardiology

## 2015-08-30 VITALS — BP 118/78 | HR 88 | Wt 278.5 lb

## 2015-08-30 DIAGNOSIS — G4733 Obstructive sleep apnea (adult) (pediatric): Secondary | ICD-10-CM | POA: Insufficient documentation

## 2015-08-30 DIAGNOSIS — E669 Obesity, unspecified: Secondary | ICD-10-CM | POA: Diagnosis not present

## 2015-08-30 DIAGNOSIS — K219 Gastro-esophageal reflux disease without esophagitis: Secondary | ICD-10-CM | POA: Diagnosis not present

## 2015-08-30 DIAGNOSIS — Z87891 Personal history of nicotine dependence: Secondary | ICD-10-CM | POA: Insufficient documentation

## 2015-08-30 DIAGNOSIS — M109 Gout, unspecified: Secondary | ICD-10-CM | POA: Diagnosis not present

## 2015-08-30 DIAGNOSIS — R079 Chest pain, unspecified: Secondary | ICD-10-CM | POA: Diagnosis not present

## 2015-08-30 DIAGNOSIS — I110 Hypertensive heart disease with heart failure: Secondary | ICD-10-CM | POA: Diagnosis not present

## 2015-08-30 DIAGNOSIS — I5032 Chronic diastolic (congestive) heart failure: Secondary | ICD-10-CM | POA: Insufficient documentation

## 2015-08-30 DIAGNOSIS — Z720 Tobacco use: Secondary | ICD-10-CM

## 2015-08-30 DIAGNOSIS — Z79899 Other long term (current) drug therapy: Secondary | ICD-10-CM | POA: Diagnosis not present

## 2015-08-30 DIAGNOSIS — Z7984 Long term (current) use of oral hypoglycemic drugs: Secondary | ICD-10-CM | POA: Insufficient documentation

## 2015-08-30 DIAGNOSIS — J449 Chronic obstructive pulmonary disease, unspecified: Secondary | ICD-10-CM

## 2015-08-30 DIAGNOSIS — I471 Supraventricular tachycardia: Secondary | ICD-10-CM | POA: Insufficient documentation

## 2015-08-30 DIAGNOSIS — F17200 Nicotine dependence, unspecified, uncomplicated: Secondary | ICD-10-CM

## 2015-08-30 DIAGNOSIS — E785 Hyperlipidemia, unspecified: Secondary | ICD-10-CM | POA: Insufficient documentation

## 2015-08-30 DIAGNOSIS — Z9981 Dependence on supplemental oxygen: Secondary | ICD-10-CM | POA: Diagnosis not present

## 2015-08-30 DIAGNOSIS — E119 Type 2 diabetes mellitus without complications: Secondary | ICD-10-CM | POA: Insufficient documentation

## 2015-08-30 NOTE — Patient Instructions (Signed)
Please do not use any NSAIDs (nonsteroidal antiinflammitory drugs), these include: aspirin celecoxib (Celebrex) diclofenac (Cambia, Cataflam, Voltaren-XR, Zipsor, Zorvolex) diflunisal (Dolobid - discontinued brand) etodolac (Lodine - discontinued brand) ibuprofen (Motrin, Advil) indomethacin (Indocin) ketoprofen (Active-Ketoprofen [Orudis - discontinued brand]) ketorolac (Toradol - discontinued brand) nabumetone (Relafen - discontinued brand) naproxen (Aleve, Anaprox, Naprelan, Naprosyn) oxaprozin (Daypro) piroxicam (Feldene) salsalate (Disalsate [Amigesic - discontinued brand]) sulindac (Clinoril - discontinued brand) tolmetin (Tolectin - discontinued brand  Your physician recommends that you schedule a follow-up appointment in: 3 months

## 2015-08-30 NOTE — Progress Notes (Signed)
Patient ID: Michele Owens, female   DOB: Aug 31, 1960, 55 y.o.   MRN: [id number] PCP: Dr. Jonelle Sidle  55 yo with history of HTN, obesity, OSA, COPD, hyperlipidemia, and SVT presents for cardiology evaluation.  She has now quit smoking and is using a nicotine patch.  She was admitted in 99991111 with diastolic CHF and COPD exacerbation.  She was admitted in 1/17 with COPD exacerbation.  She was set up for a Cardiolite in 2/17.  This showed a small, moderate intensity partially reversible inferolateral defect.  Last echo was in 8/16 and showed normal EF with moderate diastolic dysfunction.  LHC/RHC in 3/17 showed no significant CAD, mildly elevated PCWP.  PFTs in 3/17 showed severe COPD.    She seems to be doing reasonably well.  Wearing nicotine patch but still smoking (cutting back).  Mild dyspnea with heavier exertion like stairs and hills, ok on flat ground.  She is using home oxygen now for COPD.  No OSA on sleep study. Weight is down 11 lbs.   Labs (11/12): K 3.6, creatinine 0.73 Labs (12/13): K 3.9, creatinine 0.9, BNP 22 Labs (11/14): K 3.9, creatinine 0.5 Labs (2/17): K 4.2, creatinine 0.62, HCT 37.4 Labs (5/17): K 3.7, creatinine 0.7, BNP 6.6  PMH: 1. Back and hip pain 2. HTN 3. Obesity 4. GERD 5. Hyperlipidemia 6. Depression 7. COPD: Has quit smoking. Now on home oxygen but does not use all the time.  PFTs (3/17) with severe COPD.  8. 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 Event monitor (12/14) with only NSR noted.  10. Chest pain: Atypical.  ETT-Cardiolite (12/13) with EF 60%, no ischemia/infarction. Lexiscan Cardiolite (2/17) with small, moderate-intensity partially reversible inferolateral defect. LHC (3/17): No significant CAD. 11. Syncope: Uncertain etiology.   12. OSA: Cannot tolerate CPAP. Sleep study negative for OSA in 5/17.  13. Gout 14. Migraines 15. Diabetes 16. Diastolic CHF: Echo (99991111) with EF 55-60%, grade II diastolic dysfunction.   - RHC (3/17) with mean RA 8, PA 45/20 mean 30, mean PCWP 20; PVR 1.5 WU.   SH: Former smoker.  Lives alone.  Does not use drugs or ETOH.   FH: Father with cancer, mother with diabetes, brother with fen-phen related valvulopathy  ROS: All systems reviewed and negative except as per HPI.   Current Outpatient Prescriptions  Medication Sig Dispense Refill  . acetaminophen (TYLENOL) 325 MG tablet Take 2 tablets (650 mg total) by mouth every 4 (four) hours as needed for headache or mild pain.    Marland Kitchen albuterol (PROVENTIL HFA;VENTOLIN HFA) 108 (90 BASE) MCG/ACT inhaler Inhale 1-2 puffs into the lungs every 6 (six) hours as needed for wheezing. 1 Inhaler 0  . albuterol (PROVENTIL) (2.5 MG/3ML) 0.083% nebulizer solution Take 2.5 mg by nebulization every 6 (six) hours as needed for wheezing or shortness of breath.    . clonazePAM (KLONOPIN) 2 MG tablet Take 2 mg by mouth 2 (two) times daily.    . colchicine 0.6 MG tablet Take 0.6 mg by mouth 2 (two) times daily.    . diclofenac (VOLTAREN) 75 MG EC tablet Take 75 mg by mouth 2 (two) times daily with a meal.  1  . diclofenac sodium (VOLTAREN) 1 % GEL Apply 2 g topically 4 (four) times daily. 100 g 0  . furosemide (LASIX) 20 MG tablet Take 3 tablets (60 mg total) by mouth 2 (two) times daily. 30 tablet   . furosemide (LASIX) 40 MG tablet Take 60 mg by  mouth 2 (two) times daily.  6  . HYDROcodone-acetaminophen (NORCO/VICODIN) 5-325 MG per tablet Take 1 tablet by mouth 2 (two) times daily as needed for moderate pain.    Marland Kitchen loratadine (CLARITIN) 10 MG tablet Take 10 mg by mouth daily.    . metFORMIN (GLUCOPHAGE) 500 MG tablet Take 1 tablet (500 mg total) by mouth 2 (two) times daily with a meal. 30 tablet 1  . metoprolol succinate (TOPROL-XL) 50 MG 24 hr tablet Take 1 tablet (50 mg total) by mouth daily. 30 tablet 11  . mirtazapine (REMERON) 15 MG tablet Take 15 mg by mouth at bedtime.  0  . nicotine (NICODERM CQ - DOSED IN MG/24 HOURS) 21 mg/24hr patch Place  21 mg onto the skin daily. Apply patch to clean dry skin every 24 hours    . nystatin cream (MYCOSTATIN) Apply 1 application topically 2 (two) times daily.  1  . omeprazole (PRILOSEC) 40 MG capsule Take 40 mg by mouth daily.  2  . potassium chloride SA (K-DUR,KLOR-CON) 20 MEQ tablet Take 20 mEq by mouth daily.    . pravastatin (PRAVACHOL) 40 MG tablet Take 40 mg by mouth daily.   2  . tiotropium (SPIRIVA) 18 MCG inhalation capsule Place 18 mcg into inhaler and inhale daily.    Marland Kitchen triamcinolone ointment (KENALOG) 0.1 % Apply 1 application topically 2 (two) times daily.  2   No current facility-administered medications for this encounter.    BP 118/78 mmHg  Pulse 88  Wt 278 lb 8 oz (126.327 kg)  SpO2 96% General: NAD, obese.  Neck: No JVD, no thyromegaly or thyroid nodule.  Lungs: Decreased BS diffusely bilaterally.  CV: Nondisplaced PMI.  Heart regular S1/S2, no S3/S4, 1/6 SEM RUSB.  Trace ankle edema.  No carotid bruit.  Normal pedal pulses.  Abdomen: Soft, nontender, no hepatosplenomegaly, no distention.  Skin: Intact without lesions or rashes.  Neurologic: Alert and oriented x 3.  Psych: Normal affect.  Extremities: No clubbing or cyanosis.   Assessment/Plan: 1. Chest pain: No significant CAD on 3/17 cath.   2. Chronic diastolic CHF: Echo (99991111) with EF 55-60%, moderate diastolic dysfunction.  NYHA class II symptoms, not volume overloaded on exam.  - Continue Lasix 60 mg bid.   3. OSA: Repeat sleep study did not show significant OSA.    5. COPD: Trying to quit smoking.  She is using nicotine patches.  Severe obstruction by PFTs.  Continue home oxygen.  Loralie Champagne 04/19/2014

## 2015-08-31 NOTE — Telephone Encounter (Signed)
I saw her in March and she was to have follow up in 8 weeks from them.  Please schedule for next available ROV with me or nurse practitioner.

## 2015-08-31 NOTE — Telephone Encounter (Signed)
Pt aware of results. States that she is already using O2 @ 2 liters at night and some during the daytime.  O2 is through [facility].  Please advise Dr Halford Chessman if any change in plan or ROV? Thanks.

## 2015-09-03 NOTE — Telephone Encounter (Signed)
Patient cm, tried to sched appt, patient refused adn wants to know why she needs appt [phone]

## 2015-09-03 NOTE — Telephone Encounter (Signed)
LM for pt x 1 Please schedule ROV first available with either VS or TP / SG / BO / KW

## 2015-09-03 NOTE — Telephone Encounter (Signed)
Spoke with pt. Advised her that VS wanted her to follow up. ROV has been scheduled for 11/19/15 at 4:15pm. Pt refused to make an appointment with a NP. Nothing further was needed.

## 2015-09-22 ENCOUNTER — Telehealth: Payer: Self-pay | Admitting: *Deleted

## 2015-09-22 NOTE — Telephone Encounter (Signed)
Pt called to change her pharmacy to [facility] [phone].  I changed pharmacy in [facility] to [facility].

## 2015-09-23 ENCOUNTER — Encounter: Payer: Self-pay | Admitting: Podiatry

## 2015-09-23 ENCOUNTER — Ambulatory Visit (INDEPENDENT_AMBULATORY_CARE_PROVIDER_SITE_OTHER): Payer: Medicaid Other | Admitting: Podiatry

## 2015-09-23 VITALS — BP 121/76 | HR 107 | Resp 16

## 2015-09-23 DIAGNOSIS — M722 Plantar fascial fibromatosis: Secondary | ICD-10-CM

## 2015-09-25 NOTE — Progress Notes (Signed)
She presents today with a chief complaint of painful bilateral heels. Right seems to be worse than the left.  Objective: Vital signs are stable she is alert and oriented 3 history of plantar fasciitis right. Pulses are palpable. Capillary fill time is immediate patient is on oxygen. Pain on palpation medial continued to the bilateral heel right greater than left.  Assessment: Pain in limb secondary to plantar fasciitis bilateral.  Plan: Injected the bilateral heels today with Kenalog and local anesthetic will follow up with her as needed.

## 2015-10-05 ENCOUNTER — Ambulatory Visit: Payer: Medicaid Other | Admitting: Podiatry

## 2015-10-07 ENCOUNTER — Ambulatory Visit: Payer: Medicaid Other | Admitting: Podiatry

## 2015-11-19 ENCOUNTER — Ambulatory Visit: Payer: Medicaid Other | Admitting: Pulmonary Disease

## 2015-11-23 ENCOUNTER — Encounter (HOSPITAL_COMMUNITY): Payer: Medicaid Other

## 2015-11-27 ENCOUNTER — Emergency Department (HOSPITAL_COMMUNITY)
Admission: EM | Admit: 2015-11-27 | Discharge: 2015-11-28 | Disposition: A | Discharge status: Home / Self Care | Payer: Medicaid Other | Attending: Emergency Medicine | Admitting: Emergency Medicine

## 2015-11-27 ENCOUNTER — Encounter (HOSPITAL_COMMUNITY): Payer: Self-pay

## 2015-11-27 DIAGNOSIS — F17210 Nicotine dependence, cigarettes, uncomplicated: Secondary | ICD-10-CM | POA: Diagnosis not present

## 2015-11-27 DIAGNOSIS — S39012A Strain of muscle, fascia and tendon of lower back, initial encounter: Secondary | ICD-10-CM | POA: Diagnosis not present

## 2015-11-27 DIAGNOSIS — I110 Hypertensive heart disease with heart failure: Secondary | ICD-10-CM | POA: Insufficient documentation

## 2015-11-27 DIAGNOSIS — J449 Chronic obstructive pulmonary disease, unspecified: Secondary | ICD-10-CM | POA: Insufficient documentation

## 2015-11-27 DIAGNOSIS — Y929 Unspecified place or not applicable: Secondary | ICD-10-CM | POA: Diagnosis not present

## 2015-11-27 DIAGNOSIS — R197 Diarrhea, unspecified: Secondary | ICD-10-CM

## 2015-11-27 DIAGNOSIS — I5032 Chronic diastolic (congestive) heart failure: Secondary | ICD-10-CM | POA: Diagnosis not present

## 2015-11-27 DIAGNOSIS — Y999 Unspecified external cause status: Secondary | ICD-10-CM | POA: Insufficient documentation

## 2015-11-27 DIAGNOSIS — X58XXXA Exposure to other specified factors, initial encounter: Secondary | ICD-10-CM | POA: Insufficient documentation

## 2015-11-27 DIAGNOSIS — Z7984 Long term (current) use of oral hypoglycemic drugs: Secondary | ICD-10-CM | POA: Insufficient documentation

## 2015-11-27 DIAGNOSIS — E119 Type 2 diabetes mellitus without complications: Secondary | ICD-10-CM | POA: Diagnosis not present

## 2015-11-27 DIAGNOSIS — Y939 Activity, unspecified: Secondary | ICD-10-CM | POA: Insufficient documentation

## 2015-11-27 DIAGNOSIS — Z79899 Other long term (current) drug therapy: Secondary | ICD-10-CM | POA: Diagnosis not present

## 2015-11-27 DIAGNOSIS — S3992XA Unspecified injury of lower back, initial encounter: Secondary | ICD-10-CM | POA: Diagnosis present

## 2015-11-27 DIAGNOSIS — R1084 Generalized abdominal pain: Secondary | ICD-10-CM

## 2015-11-27 LAB — URINALYSIS, ROUTINE W REFLEX MICROSCOPIC
Bilirubin Urine: NEGATIVE
GLUCOSE, UA: NEGATIVE mg/dL
HGB URINE DIPSTICK: NEGATIVE
Ketones, ur: NEGATIVE mg/dL
Leukocytes, UA: NEGATIVE
Nitrite: NEGATIVE
Protein, ur: NEGATIVE mg/dL
SPECIFIC GRAVITY, URINE: 1.011 (ref 1.005–1.030)
pH: 7.5 (ref 5.0–8.0)

## 2015-11-27 LAB — COMPREHENSIVE METABOLIC PANEL
ALT: 21 U/L (ref 14–54)
ANION GAP: 8 (ref 5–15)
AST: 22 U/L (ref 15–41)
Albumin: 3.9 g/dL (ref 3.5–5.0)
Alkaline Phosphatase: 80 U/L (ref 38–126)
BUN: <5 mg/dL — ABNORMAL LOW (ref 6–20)
CO2: 28 mmol/L (ref 22–32)
Calcium: 9.5 mg/dL (ref 8.9–10.3)
Chloride: 102 mmol/L (ref 101–111)
Creatinine, Ser: 0.46 mg/dL (ref 0.44–1.00)
GFR calc non Af Amer: >60 mL/min (ref >60)
Glucose, Bld: 87 mg/dL (ref 65–99)
POTASSIUM: 3.8 mmol/L (ref 3.5–5.1)
SODIUM: 138 mmol/L (ref 135–145)
Total Bilirubin: 0.4 mg/dL (ref 0.3–1.2)
Total Protein: 7.2 g/dL (ref 6.5–8.1)

## 2015-11-27 LAB — CBC
HCT: 38.2 % (ref 36.0–46.0)
HEMOGLOBIN: 11.4 g/dL — AB (ref 12.0–15.0)
MCH: 25.8 pg — AB (ref 26.0–34.0)
MCHC: 29.8 g/dL — ABNORMAL LOW (ref 30.0–36.0)
MCV: 86.4 fL (ref 78.0–100.0)
Platelets: 328 10*3/uL (ref 150–400)
RBC: 4.42 MIL/uL (ref 3.87–5.11)
RDW: 15 % (ref 11.5–15.5)
WBC: 15.6 10*3/uL — ABNORMAL HIGH (ref 4.0–10.5)

## 2015-11-27 LAB — LIPASE, BLOOD: LIPASE: 36 U/L (ref 11–51)

## 2015-11-27 MED ORDER — ONDANSETRON HCL 4 MG/2ML IJ SOLN
4.0000 mg | Freq: Once | INTRAMUSCULAR | Status: AC
  Administered 2015-11-27: 4 mg via INTRAVENOUS
  Filled 2015-11-27: qty 2

## 2015-11-27 MED ORDER — FENTANYL CITRATE (PF) 100 MCG/2ML IJ SOLN
50.0000 ug | Freq: Once | INTRAMUSCULAR | Status: AC
  Administered 2015-11-27: 50 ug via INTRAVENOUS
  Filled 2015-11-27: qty 2

## 2015-11-27 NOTE — ED Provider Notes (Signed)
[facility] DEPT Provider Note   CSN: [id number] Arrival date & time: 11/27/15  2056     History   Chief Complaint Chief Complaint  Patient presents with  . Abdominal Pain  . Back Pain    HPI Michele Owens is a 55 y.o. female.  55 year old female with a history of COPD and CHF, on 2 L oxygen via nasal cannula chronically, and diabetes mellitus presents to the emergency department today for "not feeling well". Patient states that she began to have abdominal pain and back pain 3 days ago. She has been taking Tylenol for symptoms without relief. She reports that "everything that I eat runs through me". She has had approximately 5 episodes of diarrhea since onset of her abdominal pain. Patient states that she has remained compliant with her daily medications. She denies any fever or known sick contacts. She further denies vomiting, dysuria, urinary frequency or urgency, melena, or hematochezia. She has an abdominal surgical history significant for abdominal hysterectomy. She is scheduled for a colonoscopy on 12/15/15.   The history is provided by the patient. No language interpreter was used.  Abdominal Pain   Associated symptoms include diarrhea. Pertinent negatives include fever, vomiting and dysuria.  Back Pain   Associated symptoms include abdominal pain. Pertinent negatives include no fever and no dysuria.    Past Medical History:  Diagnosis Date  . Asthma   . Chronic diastolic CHF (congestive heart failure) ([place])   . COPD (chronic obstructive pulmonary disease) ([place])   . Diabetes mellitus without complication ([place])   . History of nuclear stress test    Myoview 2/17:  Low risk stress nuclear study with a small, moderate intensity, partially reversible inferior lateral defect consistent with small prior infarct and minimal peri-infarct ischemia; EF 68 with normal wall motion  . Hypertension     Patient Active Problem List   Diagnosis Date Noted  . Obesity hypoventilation  syndrome ([place]) 08/26/2015  . Chronic respiratory failure with hypoxia ([place]) 08/26/2015  . Chest pain 06/16/2015  . Dyslipidemia associated with type 2 diabetes mellitus ([place]) 04/24/2015  . Leukocytosis 04/24/2015  . Controlled type 2 diabetes mellitus without complication, without long-term current use of insulin ([place]) 04/24/2015  . Chronic diastolic CHF (congestive heart failure) ([place]) 04/24/2015  . Anxiety 04/24/2015  . Benign essential HTN 04/24/2015  . Normocytic anemia 04/24/2015  . COPD (chronic obstructive pulmonary disease) ([place]) 04/24/2015  . Acute respiratory failure with hypoxia ([place]) 12/07/2014  . COPD exacerbation ([place])   . OSA (obstructive sleep apnea) 05/10/2012  . Smoking 07/04/2011    Past Surgical History:  Procedure Laterality Date  . BUNIONECTOMY Bilateral   . CARDIAC CATHETERIZATION N/A 06/23/2015   Procedure: Right/Left Heart Cath and Coronary Angiography;  Surgeon: [name], MD;  Location: [facility];  Service: Cardiovascular;  Laterality: N/A;  . TONSILLECTOMY    . TOTAL ABDOMINAL HYSTERECTOMY  07/14/10    OB History    No data available       Home Medications    Prior to Admission medications   Medication Sig Start Date End Date Taking? Authorizing Provider  acetaminophen (TYLENOL) 325 MG tablet Take 2 tablets (650 mg total) by mouth every 4 (four) hours as needed for headache or mild pain. 06/24/15  Yes Shirley Friar, PA-C  acetaminophen (TYLENOL) 500 MG tablet Take 1,000 mg by mouth every 6 (six) hours as needed for mild pain.   Yes Historical Provider, MD  albuterol (PROVENTIL HFA;VENTOLIN HFA) 108 (90  BASE) MCG/ACT inhaler Inhale 1-2 puffs into the lungs every 6 (six) hours as needed for wheezing. 04/09/12  Yes Billy Fischer, MD  albuterol (PROVENTIL) (2.5 MG/3ML) 0.083% nebulizer solution Take 2.5 mg by nebulization every 6 (six) hours as needed for wheezing or shortness of breath.   Yes Historical Provider, MD  clonazePAM  (KLONOPIN) 2 MG tablet Take 2 mg by mouth 2 (two) times daily.   Yes Historical Provider, MD  colchicine 0.6 MG tablet Take 0.6 mg by mouth 2 (two) times daily.   Yes Historical Provider, MD  diclofenac sodium (VOLTAREN) 1 % GEL Apply 2 g topically 4 (four) times daily.   Yes Historical Provider, MD  EPINEPHrine 0.3 mg/0.3 mL IJ SOAJ injection Inject 0.3 mg into the muscle daily as needed (allergic reaction).   Yes Historical Provider, MD  furosemide (LASIX) 20 MG tablet Take 3 tablets (60 mg total) by mouth 2 (two) times daily. 06/24/15  Yes Shirley Friar, PA-C  HYDROcodone-acetaminophen (NORCO) 7.5-325 MG tablet take 1 tablet by mouth every 12 hours WHEN NECESSARY PAIN (MUST LAST 2 WEEKS); Pt received 28 pills total 09/21/15  Yes Historical Provider, MD  loratadine (CLARITIN) 10 MG tablet Take 10 mg by mouth daily.   Yes Historical Provider, MD  metFORMIN (GLUCOPHAGE) 500 MG tablet Take 1 tablet (500 mg total) by mouth 2 (two) times daily with a meal. 12/11/14  Yes [name], MD  metoprolol succinate (TOPROL-XL) 50 MG 24 hr tablet Take 1 tablet (50 mg total) by mouth daily. 03/19/12  Yes Scott T Kathlen Mody, PA-C  mirtazapine (REMERON) 15 MG tablet Take 15 mg by mouth at bedtime. 11/16/14  Yes Historical Provider, MD  nystatin cream (MYCOSTATIN) Apply 1 application topically 2 (two) times daily. 07/30/15  Yes Historical Provider, MD  omeprazole (PRILOSEC) 40 MG capsule Take 40 mg by mouth daily. 04/13/15  Yes Historical Provider, MD  potassium chloride SA (K-DUR,KLOR-CON) 20 MEQ tablet Take 20 mEq by mouth daily.   Yes Historical Provider, MD  pravastatin (PRAVACHOL) 40 MG tablet Take 40 mg by mouth daily.  03/29/15  Yes Historical Provider, MD  tiotropium (SPIRIVA) 18 MCG inhalation capsule Place 18 mcg into inhaler and inhale daily.   Yes Historical Provider, MD  triamcinolone ointment (KENALOG) 0.1 % Apply 1 application topically 2 (two) times daily. 07/24/15  Yes Historical Provider, MD     Family History Family History  Problem Relation Age of Onset  . Cancer Father     Social History Social History  Substance Use Topics  . Smoking status: Heavy Tobacco Smoker    Packs/day: 0.50    Years: 41.00    Types: Cigarettes  . Smokeless tobacco: Never Used     Comment: Starting to [employer] -- using Nicotine Patch  . Alcohol use 0.0 oz/week     Comment: 12/07/2014 "might have a beer a couple times/month"     Allergies   Lamisil [terbinafine hcl]; Nsaids; and Ibuprofen   Review of Systems Review of Systems  Constitutional: Negative for fever.  Gastrointestinal: Positive for abdominal pain and diarrhea. Negative for vomiting.  Genitourinary: Negative for dysuria.  Musculoskeletal: Positive for back pain.  Ten systems reviewed and are negative for acute change, except as noted in the HPI.    Physical Exam Updated Vital Signs BP 131/77   Pulse 92   Temp 98.2 F (36.8 C) (Oral)   Resp 16   Ht 5\' 5"  (1.651 m)   Wt 130.3 kg  SpO2 100%   BMI 47.80 kg/m   Physical Exam  Constitutional: She is oriented to person, place, and time. She appears well-developed and well-nourished. No distress.  Morbidly obese. In NAD.  HENT:  Head: Normocephalic and atraumatic.  Eyes: Conjunctivae and EOM are normal. No scleral icterus.  Neck: Normal range of motion.  Cardiovascular: Normal rate, regular rhythm and intact distal pulses.   Pulmonary/Chest: No respiratory distress. She has no wheezes. She has no rales.  Mild dyspnea without tachypnea. Patient is satting 99% on 2 L nasal cannula which she uses chronically. Prolonged expiratory phase. No wheezes or rales.  Abdominal: Soft. There is tenderness.  Soft morbidly obese abdomen with diffuse, generalized tenderness. No focal tenderness or masses palpated. No peritoneal signs. Exam is limited secondary to body habitus.  Musculoskeletal: Normal range of motion.  Neurological: She is alert and oriented to person, place, and  time.  GCS 15. Patient moving all extremities.  Skin: Skin is warm and dry. No rash noted. She is not diaphoretic. No erythema. No pallor.  Psychiatric: She has a normal mood and affect. Her behavior is normal.  Nursing note and vitals reviewed.    ED Treatments / Results  Labs (all labs ordered are listed, but only abnormal results are displayed) Labs Reviewed  COMPREHENSIVE METABOLIC PANEL - Abnormal; Notable for the following:       Result Value   BUN <5 (*)    All other components within normal limits  CBC - Abnormal; Notable for the following:    WBC 15.6 (*)    Hemoglobin 11.4 (*)    MCH 25.8 (*)    MCHC 29.8 (*)    All other components within normal limits  LIPASE, BLOOD  URINALYSIS, ROUTINE W REFLEX MICROSCOPIC (NOT AT [facility])    EKG  EKG Interpretation None       Radiology Ct Abdomen Pelvis W Contrast  Result Date: 11/28/2015 CLINICAL DATA:  Abdominal pain and back pain beginning 11/24/2015. Diarrhea. EXAM: CT ABDOMEN AND PELVIS WITH CONTRAST TECHNIQUE: Multidetector CT imaging of the abdomen and pelvis was performed using the standard protocol following bolus administration of intravenous contrast. CONTRAST:  146mL ISOVUE-300 IOPAMIDOL (ISOVUE-300) INJECTION 61% COMPARISON:  12/27/2005 FINDINGS: Lower chest and abdominal wall:  No contributory findings. Hepatobiliary: No focal liver abnormality. Low-density seen on 2007 noncontrast exam is not identified today. Possible hepatic steatosis. No evidence of biliary obstruction or stone. Pancreas: Unremarkable. Spleen: Unremarkable. Adrenals/Urinary Tract: Negative adrenals. No hydronephrosis or stone. Unremarkable bladder. Stomach/Bowel:  No obstruction. No appendicitis. Reproductive:Hysterectomy. 19 mm right ovarian cyst which is simple by CT and likely incidental at this size. Vascular/Lymphatic: No acute vascular abnormality. No mass or adenopathy. Other: No ascites or pneumoperitoneum. Musculoskeletal: No acute  abnormalities. IMPRESSION: No explanation for abdominal pain. Electronically Signed   By: Monte Fantasia M.D.   On: 11/28/2015 01:19    Procedures Procedures (including critical care time)  Medications Ordered in ED Medications  fentaNYL (SUBLIMAZE) injection 50 mcg (50 mcg Intravenous Given 11/27/15 2342)  ondansetron (ZOFRAN) injection 4 mg (4 mg Intravenous Given 11/27/15 2342)  iopamidol (ISOVUE-300) 61 % injection (100 mLs  Contrast Given 11/28/15 0033)     Initial Impression / Assessment and Plan / ED Course  I have reviewed the triage vital signs and the nursing notes.  Pertinent labs & imaging results that were available during my care of the patient were reviewed by me and considered in my medical decision making (see chart for details).  Clinical  Course    55 year old female presents to the emergency department for further evaluation of diarrhea and generalized abdominal pain. Patient also complaining of lower back discomfort associated with onset of her symptoms. Patient is nontoxic appearing with reassuring laboratory workup. She is noted to have a mild leukocytosis; however, this appears consistent with prior evaluations stating back to 2012.  CT abdomen pelvis obtained to further evaluate etiology of abdominal pain. Physical exam limited secondary to body habitus, but without signs of acute surgical abdomen. CT abdomen pelvis is negative today. No evidence of infectious or surgical process.  Patient has been tolerating ginger ale in the emergency department. She has no clinical signs of dehydration. Suspect that cramping and diarrhea are secondary to a viral illness. Will manage supportively with Lactinex and Bentyl. Patient referred to her primary care doctor for follow-up. I have attempted to call her prescriptions into her pharmacy ([facility] on [address]). No indication for further emergent workup at this time. Patient discharged in satisfactory condition with no unaddressed  concerns.   Final Clinical Impressions(s) / ED Diagnoses   Final diagnoses:  Generalized abdominal pain  Diarrhea, unspecified type  Low back strain, initial encounter    Vitals:   11/27/15 2345 11/28/15 0000 11/28/15 0015 11/28/15 0143  BP: 130/69 109/63 131/77 126/79  Pulse: 87 95 92 87  Resp:    16  Temp:      TempSrc:      SpO2: 98% 97% 100% 100%  Weight:      Height:         New Prescriptions New Prescriptions   No medications on file     Antonietta Breach, PA-C 11/28/15 [id number]    Milton Ferguson, MD 11/28/15 [phone]

## 2015-11-27 NOTE — ED Triage Notes (Signed)
Onset 11-24-15 abd pain, mid back pain and diarrhea x 5 loose.  No urinary symptoms.

## 2015-11-28 ENCOUNTER — Encounter (HOSPITAL_COMMUNITY): Payer: Self-pay | Admitting: Radiology

## 2015-11-28 ENCOUNTER — Emergency Department (HOSPITAL_COMMUNITY): Payer: Medicaid Other

## 2015-11-28 MED ORDER — DICYCLOMINE HCL 20 MG PO TABS: 20.0000 mg | ORAL_TABLET | Freq: Two times a day (BID) | ORAL | 0 refills | Status: DC | PRN

## 2015-11-28 MED ORDER — IOPAMIDOL (ISOVUE-300) INJECTION 61%
INTRAVENOUS | Status: AC
  Administered 2015-11-28: 100 mL
  Filled 2015-11-28: qty 100

## 2015-11-28 MED ORDER — LACTINEX PO PACK: PACK | ORAL | 0 refills | Status: DC

## 2015-11-28 NOTE — Discharge Instructions (Signed)
We suspect that your abdominal pain and diarrhea is due to a viral infection which should resolve on its own. Take Bentyl as prescribed for abdominal pain and cramping. Use Lactinex as prescribed for diarrhea. Your back pain is suspected to be due to a muscle strain. You may take Tylenol or ibuprofen for continued discomfort. Follow-up with your primary care doctor regarding your visit to the emergency department today. You may return for any new or concerning symptoms.

## 2015-11-28 NOTE — ED Notes (Signed)
Pt stable, ambulatory, states understanding of discharge instructions 

## 2015-12-08 ENCOUNTER — Other Ambulatory Visit: Payer: Self-pay | Admitting: Gastroenterology

## 2015-12-14 ENCOUNTER — Encounter (HOSPITAL_COMMUNITY): Payer: Self-pay | Admitting: *Deleted

## 2015-12-14 NOTE — Anesthesia Preprocedure Evaluation (Addendum)
Anesthesia Evaluation  Patient identified by MRN, date of birth, ID band  Reviewed: Allergy & Precautions, NPO status , Patient's Chart, lab work & pertinent test results, reviewed documented beta blocker date and time   Airway Mallampati: III  TM Distance: >3 FB Neck ROM: Full    Dental  (+) Teeth Intact, Dental Advisory Given   Pulmonary asthma , sleep apnea , COPD, Current Smoker,    breath sounds clear to auscultation       Cardiovascular hypertension, Pt. on medications and Pt. on home beta blockers +CHF   Rhythm:Regular Rate:Bradycardia     Neuro/Psych  Headaches, PSYCHIATRIC DISORDERS Anxiety Depression    GI/Hepatic Neg liver ROS, GERD  Medicated,  Endo/Other  diabetes, Type 2, Oral Hypoglycemic Agents  Renal/GU negative Renal ROS  negative genitourinary   Musculoskeletal  (+) Arthritis , Osteoarthritis,    Abdominal   Peds negative pediatric ROS (+)  Hematology negative hematology ROS (+)   Anesthesia Other Findings   Reproductive/Obstetrics negative OB ROS                            08/2015 EKG: NSR  11/2014 Echo - Left ventricle: The cavity size was normal. Systolic function was   normal. The estimated ejection fraction was in the range of 55%   to 60%. Wall motion was normal; there were no regional wall   motion abnormalities. Features are consistent with a pseudonormal   left ventricular filling pattern, with concomitant abnormal   relaxation and increased filling pressure (grade 2 diastolic   dysfunction). Doppler parameters are consistent with high   ventricular filling pressure. - Aortic valve: Valve area (VTI): 2 cm^2. Valve area (Vmax): 2.04   cm^2. Valve area (Vmean): 1.88 cm^2. - Tricuspid valve: There was trivial regurgitation.  Anesthesia Physical Anesthesia Plan  ASA: III  Anesthesia Plan: MAC   Post-op Pain Management:    Induction: Intravenous  Airway  Management Planned: Natural Airway  Additional Equipment:   Intra-op Plan:   Post-operative Plan:   Informed Consent: I have reviewed the patients History and Physical, chart, labs and discussed the procedure including the risks, benefits and alternatives for the proposed anesthesia with the patient or authorized representative who has indicated his/her understanding and acceptance.   Dental advisory given  Plan Discussed with: CRNA  Anesthesia Plan Comments:         Anesthesia Quick Evaluation

## 2015-12-14 NOTE — Progress Notes (Signed)
Pt denies cardiac history or chest pain. Does have sob at times, uses Oxygen at bedtime and as needed during the day.   Echo- 12/08/14 Cath- 06/23/15 EKG- 08/09/15

## 2015-12-15 ENCOUNTER — Ambulatory Visit (HOSPITAL_COMMUNITY): Payer: Medicaid Other | Admitting: Anesthesiology

## 2015-12-15 ENCOUNTER — Ambulatory Visit (HOSPITAL_COMMUNITY)
Admission: RE | Admit: 2015-12-15 | Discharge: 2015-12-15 | Disposition: A | Discharge status: Home / Self Care | Payer: Medicaid Other | Source: Ambulatory Visit | Attending: Gastroenterology | Admitting: Gastroenterology

## 2015-12-15 ENCOUNTER — Encounter (HOSPITAL_COMMUNITY)
Admission: RE | Disposition: A | Discharge status: Home / Self Care | Payer: Self-pay | Source: Ambulatory Visit | Attending: Gastroenterology

## 2015-12-15 ENCOUNTER — Encounter (HOSPITAL_COMMUNITY): Payer: Self-pay

## 2015-12-15 DIAGNOSIS — Z7984 Long term (current) use of oral hypoglycemic drugs: Secondary | ICD-10-CM | POA: Diagnosis not present

## 2015-12-15 DIAGNOSIS — J45909 Unspecified asthma, uncomplicated: Secondary | ICD-10-CM | POA: Diagnosis not present

## 2015-12-15 DIAGNOSIS — Z86010 Personal history of colonic polyps: Secondary | ICD-10-CM | POA: Diagnosis not present

## 2015-12-15 DIAGNOSIS — M1990 Unspecified osteoarthritis, unspecified site: Secondary | ICD-10-CM | POA: Insufficient documentation

## 2015-12-15 DIAGNOSIS — E119 Type 2 diabetes mellitus without complications: Secondary | ICD-10-CM | POA: Diagnosis not present

## 2015-12-15 DIAGNOSIS — D127 Benign neoplasm of rectosigmoid junction: Secondary | ICD-10-CM | POA: Diagnosis not present

## 2015-12-15 DIAGNOSIS — Z1211 Encounter for screening for malignant neoplasm of colon: Secondary | ICD-10-CM | POA: Insufficient documentation

## 2015-12-15 DIAGNOSIS — J449 Chronic obstructive pulmonary disease, unspecified: Secondary | ICD-10-CM | POA: Insufficient documentation

## 2015-12-15 DIAGNOSIS — I509 Heart failure, unspecified: Secondary | ICD-10-CM | POA: Insufficient documentation

## 2015-12-15 DIAGNOSIS — D123 Benign neoplasm of transverse colon: Secondary | ICD-10-CM | POA: Diagnosis not present

## 2015-12-15 DIAGNOSIS — K219 Gastro-esophageal reflux disease without esophagitis: Secondary | ICD-10-CM | POA: Insufficient documentation

## 2015-12-15 DIAGNOSIS — I110 Hypertensive heart disease with heart failure: Secondary | ICD-10-CM | POA: Diagnosis not present

## 2015-12-15 HISTORY — DX: Pneumonia, unspecified organism: J18.9

## 2015-12-15 HISTORY — DX: Headache, unspecified: R51.9

## 2015-12-15 HISTORY — PX: COLONOSCOPY WITH PROPOFOL: SHX5780

## 2015-12-15 HISTORY — DX: Anxiety disorder, unspecified: F41.9

## 2015-12-15 HISTORY — DX: Gout, unspecified: M10.9

## 2015-12-15 HISTORY — DX: Headache: R51

## 2015-12-15 HISTORY — DX: Personal history of other venous thrombosis and embolism: Z86.718

## 2015-12-15 LAB — GLUCOSE, CAPILLARY: GLUCOSE-CAPILLARY: 91 mg/dL (ref 65–99)

## 2015-12-15 SURGERY — COLONOSCOPY WITH PROPOFOL
Anesthesia: Monitor Anesthesia Care

## 2015-12-15 MED ORDER — PROPOFOL 10 MG/ML IV BOLUS
INTRAVENOUS | Status: DC | PRN
  Administered 2015-12-15: 20 mg via INTRAVENOUS

## 2015-12-15 MED ORDER — PROPOFOL 500 MG/50ML IV EMUL
INTRAVENOUS | Status: DC | PRN
  Administered 2015-12-15: 120 ug/kg/min via INTRAVENOUS

## 2015-12-15 MED ORDER — SODIUM CHLORIDE 0.9 % IV SOLN: INTRAVENOUS | Status: DC

## 2015-12-15 MED ORDER — ALBUTEROL SULFATE HFA 108 (90 BASE) MCG/ACT IN AERS
INHALATION_SPRAY | RESPIRATORY_TRACT | Status: DC | PRN
  Administered 2015-12-15: 6 via RESPIRATORY_TRACT

## 2015-12-15 MED ORDER — LACTATED RINGERS IV SOLN
INTRAVENOUS | Status: DC
  Administered 2015-12-15: 1000 mL via INTRAVENOUS

## 2015-12-15 MED ORDER — LIDOCAINE HCL (CARDIAC) 20 MG/ML IV SOLN
INTRAVENOUS | Status: DC | PRN
  Administered 2015-12-15: 100 mg via INTRATRACHEAL

## 2015-12-15 NOTE — Discharge Instructions (Signed)

## 2015-12-15 NOTE — Anesthesia Procedure Notes (Signed)
Procedure Name: MAC Date/Time: 12/15/2015 9:14 AM Performed by: Salli Quarry Ladashia Demarinis Pre-anesthesia Checklist: Patient identified, Emergency Drugs available, Suction available and Patient being monitored Oxygen Delivery Method: Simple face mask

## 2015-12-15 NOTE — Transfer of Care (Signed)
Immediate Anesthesia Transfer of Care Note  Patient: Michele Owens  Procedure(s) Performed: Procedure(s): COLONOSCOPY WITH PROPOFOL (N/A)  Patient Location: Endoscopy Unit  Anesthesia Type:MAC  Level of Consciousness: awake, alert , oriented and patient cooperative  Airway & Oxygen Therapy: Patient Spontanous Breathing  Post-op Assessment: Report given to RN and Post -op Vital signs reviewed and stable  Post vital signs: Reviewed and stable  Last Vitals:  Vitals:   12/15/15 0744  BP: 130/67  Pulse: 81  Resp: 20  Temp: 37 C    Last Pain:  Vitals:   12/15/15 0744  TempSrc: Oral         Complications: No apparent anesthesia complications

## 2015-12-15 NOTE — Anesthesia Postprocedure Evaluation (Signed)
Anesthesia Post Note  Patient: Michele Owens  Procedure(s) Performed: Procedure(s) (LRB): COLONOSCOPY WITH PROPOFOL (N/A)  Patient location during evaluation: PACU Anesthesia Type: MAC Level of consciousness: awake and alert Pain management: pain level controlled Vital Signs Assessment: post-procedure vital signs reviewed and stable Respiratory status: spontaneous breathing, nonlabored ventilation, respiratory function stable and patient connected to nasal cannula oxygen Cardiovascular status: stable and blood pressure returned to baseline Anesthetic complications: no    Last Vitals:  Vitals:   12/15/15 1000 12/15/15 1010  BP: (!) 151/84 136/70  Pulse: 86 79  Resp: (!) 26 18  Temp:      Last Pain:  Vitals:   12/15/15 0744  TempSrc: Oral                 Effie Berkshire

## 2015-12-15 NOTE — Op Note (Addendum)
[facility] Patient Name: Michele Owens Procedure Date : 12/15/2015 MRN: [id number] Attending MD: Missy Sabins , MD Date of Birth: 03-Feb-1961 CSN: [id number] Age: 55 Admit Type: Outpatient Procedure:                Colonoscopy Indications:              High risk colon cancer surveillance: Personal                            history of colonic polyps Providers:                Elyse Jarvis. Amedeo Plenty, MD, Dortha Schwalbe RN, RN,                            William Dalton, Technician Referring MD:              Medicines:                Propofol per Anesthesia Complications:            No immediate complications. Estimated Blood Loss:     Estimated blood loss: none. Procedure:                Pre-Anesthesia Assessment:                           - Prior to the procedure, a History and Physical                            was performed, and patient medications and                            allergies were reviewed. The patient's tolerance of                            previous anesthesia was also reviewed. The risks                            and benefits of the procedure and the sedation                            options and risks were discussed with the patient.                            All questions were answered, and informed consent                            was obtained. Prior Anticoagulants: The patient has                            taken no previous anticoagulant or antiplatelet                            agents. ASA Grade Assessment: III - A patient with  severe systemic disease. After reviewing the risks                            and benefits, the patient was deemed in                            satisfactory condition to undergo the procedure.                           After obtaining informed consent, the colonoscope                            was passed under direct vision. Throughout the                            procedure, the patient's  blood pressure, pulse, and                            oxygen saturations were monitored continuously. The                            [id number] scope was introduced through                            the anus and advanced to the the cecum, identified                            by appendiceal orifice and ileocecal valve. The                            colonoscopy was performed without difficulty. The                            patient tolerated the procedure well. The quality                            of the bowel preparation was good. The ileocecal                            valve, appendiceal orifice, and rectum were                            photographed. Scope In: 9:22:16 AM Scope Out: 9:42:00 AM Scope Withdrawal Time: 0 hours 12 minutes 34 seconds  Total Procedure Duration: 0 hours 19 minutes 44 seconds  Findings:      A 6 mm polyp was found in the transverse colon. The polyp was sessile.       The polyp was removed with a hot snare. Resection and retrieval were       complete.      A 8 mm polyp was found in the recto-sigmoid colon. The polyp was       sessile. The polyp was removed with a hot snare. Resection and retrieval       were complete.      The exam was otherwise  without abnormality. Impression:               - One 6 mm polyp in the transverse colon, removed                            with a hot snare. Resected and retrieved.                           - One 8 mm polyp at the recto-sigmoid colon,                            removed with a hot snare. Resected and retrieved.                           - The examination was otherwise normal. Moderate Sedation:      Moderate (conscious) sedation was. The following parameters were       monitored: oxygen saturation, heart rate, blood pressure, and response       to care. Recommendation:           - Patient has a contact number available for                            emergencies. The signs and symptoms of potential                             delayed complications were discussed with the                            patient. Return to normal activities tomorrow.                            Written discharge instructions were provided to the                            patient.                           - Resume previous diet.                           - Continue present medications.                           - Await pathology results.                           - Repeat colonoscopy in 3 - 5 years for                            surveillance based on pathology results. Procedure Code(s):        --- Professional ---                           [phone], Colonoscopy, flexible; with removal of  tumor(s), polyp(s), or other lesion(s) by snare                            technique Diagnosis Code(s):        --- Professional ---                           Z86.010, Personal history of colonic polyps                           D12.3, Benign neoplasm of transverse colon (hepatic                            flexure or splenic flexure)                           D12.7, Benign neoplasm of rectosigmoid junction CPT copyright 2016 [employer]. All rights reserved. The codes documented in this report are preliminary and upon coder review may  be revised to meet current compliance requirements. Missy Sabins, MD 12/15/2015 9:46:35 AM This report has been signed electronically. Number of Addenda: 0

## 2015-12-16 ENCOUNTER — Encounter (HOSPITAL_COMMUNITY): Payer: Self-pay | Admitting: Gastroenterology

## 2015-12-20 ENCOUNTER — Telehealth: Payer: Self-pay | Admitting: Licensed Clinical Social Worker

## 2015-12-20 NOTE — Telephone Encounter (Signed)
CSW received referral to assist patient who called requesting accounting of her co-pays in the HF clinic for her food stamp application. CSW contacted patient via phone to inform no co-pays collected at clinic. Patient has medicaid and will follow up with pharmacy regarding out of pocket medical expenses. Patient shared multiple deaths in the family over the past two weeks and feeling a bit overwhelmed with all the news. Patient stated she has support and strong faith in God to help her through. CSW provided support and offered assistance if needed. Patient verbalized understanding and will follow up with CSW if needed on next clinic visit. Raquel Sarna, LCSW [phone]
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
## 2016-01-23 ENCOUNTER — Inpatient Hospital Stay (HOSPITAL_COMMUNITY)
Admission: EM | Admit: 2016-01-23 | Discharge: 2016-01-27 | DRG: 291 | Disposition: A | Discharge status: Home Health | Payer: Medicaid Other | Attending: Internal Medicine | Admitting: Internal Medicine

## 2016-01-23 ENCOUNTER — Encounter (HOSPITAL_COMMUNITY): Payer: Self-pay | Admitting: *Deleted

## 2016-01-23 ENCOUNTER — Emergency Department (HOSPITAL_COMMUNITY): Payer: Medicaid Other

## 2016-01-23 ENCOUNTER — Inpatient Hospital Stay (HOSPITAL_COMMUNITY): Payer: Medicaid Other

## 2016-01-23 DIAGNOSIS — Z79899 Other long term (current) drug therapy: Secondary | ICD-10-CM | POA: Diagnosis not present

## 2016-01-23 DIAGNOSIS — D649 Anemia, unspecified: Secondary | ICD-10-CM | POA: Diagnosis present

## 2016-01-23 DIAGNOSIS — Z9981 Dependence on supplemental oxygen: Secondary | ICD-10-CM | POA: Diagnosis not present

## 2016-01-23 DIAGNOSIS — Z7984 Long term (current) use of oral hypoglycemic drugs: Secondary | ICD-10-CM | POA: Diagnosis not present

## 2016-01-23 DIAGNOSIS — I5031 Acute diastolic (congestive) heart failure: Secondary | ICD-10-CM | POA: Diagnosis not present

## 2016-01-23 DIAGNOSIS — M170 Bilateral primary osteoarthritis of knee: Secondary | ICD-10-CM | POA: Diagnosis present

## 2016-01-23 DIAGNOSIS — I110 Hypertensive heart disease with heart failure: Secondary | ICD-10-CM | POA: Diagnosis present

## 2016-01-23 DIAGNOSIS — K219 Gastro-esophageal reflux disease without esophagitis: Secondary | ICD-10-CM | POA: Diagnosis present

## 2016-01-23 DIAGNOSIS — Z833 Family history of diabetes mellitus: Secondary | ICD-10-CM | POA: Diagnosis not present

## 2016-01-23 DIAGNOSIS — J441 Chronic obstructive pulmonary disease with (acute) exacerbation: Secondary | ICD-10-CM | POA: Diagnosis present

## 2016-01-23 DIAGNOSIS — F32A Depression, unspecified: Secondary | ICD-10-CM | POA: Diagnosis present

## 2016-01-23 DIAGNOSIS — Z9111 Patient's noncompliance with dietary regimen: Secondary | ICD-10-CM

## 2016-01-23 DIAGNOSIS — M109 Gout, unspecified: Secondary | ICD-10-CM | POA: Diagnosis present

## 2016-01-23 DIAGNOSIS — I10 Essential (primary) hypertension: Secondary | ICD-10-CM | POA: Diagnosis present

## 2016-01-23 DIAGNOSIS — E119 Type 2 diabetes mellitus without complications: Secondary | ICD-10-CM | POA: Diagnosis present

## 2016-01-23 DIAGNOSIS — G8929 Other chronic pain: Secondary | ICD-10-CM | POA: Diagnosis present

## 2016-01-23 DIAGNOSIS — Z86718 Personal history of other venous thrombosis and embolism: Secondary | ICD-10-CM | POA: Diagnosis not present

## 2016-01-23 DIAGNOSIS — E042 Nontoxic multinodular goiter: Secondary | ICD-10-CM | POA: Diagnosis present

## 2016-01-23 DIAGNOSIS — J9621 Acute and chronic respiratory failure with hypoxia: Secondary | ICD-10-CM | POA: Diagnosis present

## 2016-01-23 DIAGNOSIS — A419 Sepsis, unspecified organism: Secondary | ICD-10-CM | POA: Diagnosis present

## 2016-01-23 DIAGNOSIS — R0600 Dyspnea, unspecified: Secondary | ICD-10-CM

## 2016-01-23 DIAGNOSIS — I5033 Acute on chronic diastolic (congestive) heart failure: Secondary | ICD-10-CM | POA: Diagnosis present

## 2016-01-23 DIAGNOSIS — J189 Pneumonia, unspecified organism: Secondary | ICD-10-CM | POA: Diagnosis not present

## 2016-01-23 DIAGNOSIS — E662 Morbid (severe) obesity with alveolar hypoventilation: Secondary | ICD-10-CM | POA: Diagnosis present

## 2016-01-23 DIAGNOSIS — F17210 Nicotine dependence, cigarettes, uncomplicated: Secondary | ICD-10-CM | POA: Diagnosis present

## 2016-01-23 DIAGNOSIS — I272 Pulmonary hypertension, unspecified: Secondary | ICD-10-CM | POA: Diagnosis present

## 2016-01-23 DIAGNOSIS — G4733 Obstructive sleep apnea (adult) (pediatric): Secondary | ICD-10-CM | POA: Diagnosis present

## 2016-01-23 DIAGNOSIS — E041 Nontoxic single thyroid nodule: Secondary | ICD-10-CM

## 2016-01-23 DIAGNOSIS — E785 Hyperlipidemia, unspecified: Secondary | ICD-10-CM | POA: Diagnosis present

## 2016-01-23 DIAGNOSIS — Z6841 Body Mass Index (BMI) 40.0 and over, adult: Secondary | ICD-10-CM | POA: Diagnosis not present

## 2016-01-23 DIAGNOSIS — F419 Anxiety disorder, unspecified: Secondary | ICD-10-CM | POA: Diagnosis present

## 2016-01-23 DIAGNOSIS — E1169 Type 2 diabetes mellitus with other specified complication: Secondary | ICD-10-CM | POA: Diagnosis present

## 2016-01-23 DIAGNOSIS — J45901 Unspecified asthma with (acute) exacerbation: Secondary | ICD-10-CM

## 2016-01-23 DIAGNOSIS — Z720 Tobacco use: Secondary | ICD-10-CM | POA: Diagnosis present

## 2016-01-23 DIAGNOSIS — F418 Other specified anxiety disorders: Secondary | ICD-10-CM | POA: Diagnosis present

## 2016-01-23 LAB — RESPIRATORY PANEL BY PCR
ADENOVIRUS-RVPPCR: NOT DETECTED
Bordetella pertussis: NOT DETECTED
CORONAVIRUS 229E-RVPPCR: NOT DETECTED
CORONAVIRUS NL63-RVPPCR: NOT DETECTED
CORONAVIRUS OC43-RVPPCR: NOT DETECTED
Chlamydophila pneumoniae: NOT DETECTED
Coronavirus HKU1: NOT DETECTED
INFLUENZA B-RVPPCR: NOT DETECTED
Influenza A: NOT DETECTED
METAPNEUMOVIRUS-RVPPCR: NOT DETECTED
Mycoplasma pneumoniae: NOT DETECTED
PARAINFLUENZA VIRUS 1-RVPPCR: NOT DETECTED
PARAINFLUENZA VIRUS 2-RVPPCR: NOT DETECTED
Parainfluenza Virus 3: NOT DETECTED
Parainfluenza Virus 4: NOT DETECTED
RESPIRATORY SYNCYTIAL VIRUS-RVPPCR: NOT DETECTED
Rhinovirus / Enterovirus: NOT DETECTED

## 2016-01-23 LAB — CBC WITH DIFFERENTIAL/PLATELET
Basophils Absolute: 0 10*3/uL (ref 0.0–0.1)
Basophils Relative: 0 %
Eosinophils Absolute: 0.2 10*3/uL (ref 0.0–0.7)
Eosinophils Relative: 1 %
HEMATOCRIT: 34.8 % — AB (ref 36.0–46.0)
Hemoglobin: 10 g/dL — ABNORMAL LOW (ref 12.0–15.0)
LYMPHS ABS: 3.1 10*3/uL (ref 0.7–4.0)
LYMPHS PCT: 14 %
MCH: 24.7 pg — ABNORMAL LOW (ref 26.0–34.0)
MCHC: 28.7 g/dL — AB (ref 30.0–36.0)
MCV: 85.9 fL (ref 78.0–100.0)
MONO ABS: 0.5 10*3/uL (ref 0.1–1.0)
MONOS PCT: 2 %
NEUTROS ABS: 18.5 10*3/uL — AB (ref 1.7–7.7)
Neutrophils Relative %: 83 %
Platelets: 315 10*3/uL (ref 150–400)
RBC: 4.05 MIL/uL (ref 3.87–5.11)
RDW: 16.2 % — AB (ref 11.5–15.5)
WBC: 22.3 10*3/uL — ABNORMAL HIGH (ref 4.0–10.5)

## 2016-01-23 LAB — APTT: APTT: 29 s (ref 24–36)

## 2016-01-23 LAB — URINALYSIS, ROUTINE W REFLEX MICROSCOPIC
Bilirubin Urine: NEGATIVE
GLUCOSE, UA: 100 mg/dL — AB
HGB URINE DIPSTICK: NEGATIVE
Ketones, ur: NEGATIVE mg/dL
LEUKOCYTES UA: NEGATIVE
Nitrite: NEGATIVE
PH: 5 (ref 5.0–8.0)
PROTEIN: NEGATIVE mg/dL
SPECIFIC GRAVITY, URINE: 1.021 (ref 1.005–1.030)

## 2016-01-23 LAB — COMPREHENSIVE METABOLIC PANEL
ALBUMIN: 3.6 g/dL (ref 3.5–5.0)
ALK PHOS: 83 U/L (ref 38–126)
ALT: 18 U/L (ref 14–54)
ANION GAP: 9 (ref 5–15)
AST: 17 U/L (ref 15–41)
BUN: 9 mg/dL (ref 6–20)
CO2: 34 mmol/L — AB (ref 22–32)
Calcium: 9 mg/dL (ref 8.9–10.3)
Chloride: 100 mmol/L — ABNORMAL LOW (ref 101–111)
Creatinine, Ser: 0.6 mg/dL (ref 0.44–1.00)
GFR calc Af Amer: >60 mL/min (ref >60)
GFR calc non Af Amer: >60 mL/min (ref >60)
GLUCOSE: 139 mg/dL — AB (ref 65–99)
POTASSIUM: 3.9 mmol/L (ref 3.5–5.1)
SODIUM: 143 mmol/L (ref 135–145)
Total Bilirubin: 0.6 mg/dL (ref 0.3–1.2)
Total Protein: 6.9 g/dL (ref 6.5–8.1)

## 2016-01-23 LAB — GLUCOSE, CAPILLARY
Glucose-Capillary: 272 mg/dL — ABNORMAL HIGH (ref 65–99)
Glucose-Capillary: 167 mg/dL — ABNORMAL HIGH (ref 65–99)
Glucose-Capillary: 192 mg/dL — ABNORMAL HIGH (ref 65–99)

## 2016-01-23 LAB — LACTIC ACID, PLASMA
LACTIC ACID, VENOUS: 3.2 mmol/L — AB (ref 0.5–1.9)
LACTIC ACID, VENOUS: 2.2 mmol/L — AB (ref 0.5–1.9)
Lactic Acid, Venous: 3.5 mmol/L (ref 0.5–1.9)

## 2016-01-23 LAB — PROCALCITONIN: Procalcitonin: 1.95 ng/mL

## 2016-01-23 LAB — BRAIN NATRIURETIC PEPTIDE: B Natriuretic Peptide: 63.3 pg/mL (ref 0.0–100.0)

## 2016-01-23 LAB — STREP PNEUMONIAE URINARY ANTIGEN: Strep Pneumo Urinary Antigen: NEGATIVE

## 2016-01-23 LAB — PROTIME-INR
INR: 1.1
PROTHROMBIN TIME: 14.2 s (ref 11.4–15.2)

## 2016-01-23 LAB — TSH: TSH: 0.676 u[IU]/mL (ref 0.350–4.500)

## 2016-01-23 MED ORDER — IPRATROPIUM-ALBUTEROL 0.5-2.5 (3) MG/3ML IN SOLN
3.0000 mL | Freq: Once | RESPIRATORY_TRACT | Status: AC
  Administered 2016-01-23: 3 mL via RESPIRATORY_TRACT
  Filled 2016-01-23: qty 3

## 2016-01-23 MED ORDER — POTASSIUM CHLORIDE CRYS ER 20 MEQ PO TBCR
20.0000 meq | EXTENDED_RELEASE_TABLET | Freq: Every day | ORAL | Status: DC
  Administered 2016-01-23 – 2016-01-24 (×2): 20 meq via ORAL
  Filled 2016-01-23 (×2): qty 1

## 2016-01-23 MED ORDER — INSULIN ASPART 100 UNIT/ML ~~LOC~~ SOLN: 0.0000 [IU] | Freq: Every day | SUBCUTANEOUS | Status: DC

## 2016-01-23 MED ORDER — LORATADINE 10 MG PO TABS: 10.0000 mg | ORAL_TABLET | Freq: Every day | ORAL | Status: DC | PRN

## 2016-01-23 MED ORDER — MORPHINE SULFATE (PF) 4 MG/ML IV SOLN
4.0000 mg | Freq: Once | INTRAVENOUS | Status: AC
  Administered 2016-01-23: 4 mg via INTRAVENOUS
  Filled 2016-01-23: qty 1

## 2016-01-23 MED ORDER — PANTOPRAZOLE SODIUM 40 MG PO TBEC
40.0000 mg | DELAYED_RELEASE_TABLET | Freq: Every day | ORAL | Status: DC
  Administered 2016-01-23 – 2016-01-27 (×5): 40 mg via ORAL
  Filled 2016-01-23 (×5): qty 1

## 2016-01-23 MED ORDER — DICLOFENAC SODIUM 1 % TD GEL
2.0000 g | Freq: Four times a day (QID) | TRANSDERMAL | Status: DC
  Administered 2016-01-23 – 2016-01-27 (×11): 2 g via TOPICAL
  Filled 2016-01-23: qty 100

## 2016-01-23 MED ORDER — DEXTROSE 5 % IV SOLN
1.0000 g | INTRAVENOUS | Status: DC
  Administered 2016-01-23 – 2016-01-26 (×4): 1 g via INTRAVENOUS
  Filled 2016-01-23 (×4): qty 10

## 2016-01-23 MED ORDER — FUROSEMIDE 10 MG/ML IJ SOLN
60.0000 mg | Freq: Once | INTRAMUSCULAR | Status: AC
  Administered 2016-01-23: 60 mg via INTRAVENOUS
  Filled 2016-01-23: qty 6

## 2016-01-23 MED ORDER — SODIUM CHLORIDE 0.9 % IV BOLUS (SEPSIS)
1000.0000 mL | Freq: Once | INTRAVENOUS | Status: AC
  Administered 2016-01-23: 1000 mL via INTRAVENOUS

## 2016-01-23 MED ORDER — ACETAMINOPHEN 325 MG PO TABS
650.0000 mg | ORAL_TABLET | ORAL | Status: DC | PRN
  Administered 2016-01-23 – 2016-01-25 (×5): 650 mg via ORAL
  Filled 2016-01-23 (×5): qty 2

## 2016-01-23 MED ORDER — DEXTROSE 5 % IV SOLN: 500.0000 mg | Freq: Once | INTRAVENOUS | Status: DC

## 2016-01-23 MED ORDER — METOPROLOL SUCCINATE ER 50 MG PO TB24
50.0000 mg | ORAL_TABLET | Freq: Every day | ORAL | Status: DC
  Administered 2016-01-23 – 2016-01-24 (×2): 50 mg via ORAL
  Filled 2016-01-23 (×2): qty 1

## 2016-01-23 MED ORDER — MIRTAZAPINE 15 MG PO TABS
15.0000 mg | ORAL_TABLET | Freq: Every day | ORAL | Status: DC
  Administered 2016-01-23 – 2016-01-26 (×4): 15 mg via ORAL
  Filled 2016-01-23 (×5): qty 1

## 2016-01-23 MED ORDER — ALBUTEROL SULFATE (2.5 MG/3ML) 0.083% IN NEBU: 2.5000 mg | INHALATION_SOLUTION | Freq: Four times a day (QID) | RESPIRATORY_TRACT | Status: DC | PRN

## 2016-01-23 MED ORDER — ONDANSETRON HCL 4 MG/2ML IJ SOLN: 4.0000 mg | Freq: Four times a day (QID) | INTRAMUSCULAR | Status: DC | PRN

## 2016-01-23 MED ORDER — METHYLPREDNISOLONE SODIUM SUCC 125 MG IJ SOLR
60.0000 mg | Freq: Two times a day (BID) | INTRAMUSCULAR | Status: DC
  Administered 2016-01-23 – 2016-01-24 (×3): 60 mg via INTRAVENOUS
  Filled 2016-01-23 (×3): qty 2

## 2016-01-23 MED ORDER — PRAVASTATIN SODIUM 40 MG PO TABS
40.0000 mg | ORAL_TABLET | Freq: Every day | ORAL | Status: DC
  Administered 2016-01-23 – 2016-01-24 (×2): 40 mg via ORAL
  Filled 2016-01-23 (×2): qty 1

## 2016-01-23 MED ORDER — AZITHROMYCIN 500 MG IV SOLR
500.0000 mg | INTRAVENOUS | Status: DC
  Administered 2016-01-23 – 2016-01-26 (×4): 500 mg via INTRAVENOUS
  Filled 2016-01-23 (×4): qty 500

## 2016-01-23 MED ORDER — SODIUM CHLORIDE 0.9% FLUSH: 3.0000 mL | INTRAVENOUS | Status: DC | PRN

## 2016-01-23 MED ORDER — HYDROCODONE-ACETAMINOPHEN 7.5-325 MG PO TABS
1.0000 | ORAL_TABLET | Freq: Two times a day (BID) | ORAL | Status: DC | PRN
  Administered 2016-01-23 – 2016-01-27 (×8): 1 via ORAL
  Filled 2016-01-23 (×9): qty 1

## 2016-01-23 MED ORDER — CLONAZEPAM 0.5 MG PO TABS
2.0000 mg | ORAL_TABLET | Freq: Two times a day (BID) | ORAL | Status: DC
  Administered 2016-01-23 – 2016-01-27 (×8): 2 mg via ORAL
  Filled 2016-01-23 (×10): qty 4

## 2016-01-23 MED ORDER — CEFTRIAXONE SODIUM 1 G IJ SOLR: 1.0000 g | Freq: Once | INTRAMUSCULAR | Status: DC

## 2016-01-23 MED ORDER — SODIUM CHLORIDE 0.9% FLUSH
3.0000 mL | Freq: Two times a day (BID) | INTRAVENOUS | Status: DC
  Administered 2016-01-23: 3 mL via INTRAVENOUS

## 2016-01-23 MED ORDER — NICOTINE 14 MG/24HR TD PT24
14.0000 mg | MEDICATED_PATCH | Freq: Every day | TRANSDERMAL | Status: DC
  Administered 2016-01-23 – 2016-01-27 (×5): 14 mg via TRANSDERMAL
  Filled 2016-01-23 (×5): qty 1

## 2016-01-23 MED ORDER — LIDOCAINE HCL (PF) 1 % IJ SOLN: 2.0000 mL | Freq: Once | INTRAMUSCULAR | Status: DC

## 2016-01-23 MED ORDER — COLCHICINE 0.6 MG PO TABS
0.6000 mg | ORAL_TABLET | Freq: Two times a day (BID) | ORAL | Status: DC
  Administered 2016-01-23 – 2016-01-27 (×9): 0.6 mg via ORAL
  Filled 2016-01-23 (×10): qty 1

## 2016-01-23 MED ORDER — FUROSEMIDE 10 MG/ML IJ SOLN: 40.0000 mg | Freq: Once | INTRAMUSCULAR | Status: DC

## 2016-01-23 MED ORDER — FUROSEMIDE 10 MG/ML IJ SOLN: 60.0000 mg | Freq: Two times a day (BID) | INTRAMUSCULAR | Status: DC

## 2016-01-23 MED ORDER — LISINOPRIL 2.5 MG PO TABS
2.5000 mg | ORAL_TABLET | Freq: Every day | ORAL | Status: DC
  Administered 2016-01-23 – 2016-01-27 (×5): 2.5 mg via ORAL
  Filled 2016-01-23 (×5): qty 1

## 2016-01-23 MED ORDER — SODIUM CHLORIDE 0.9 % IV SOLN
INTRAVENOUS | Status: DC
  Administered 2016-01-23 – 2016-01-24 (×3): via INTRAVENOUS

## 2016-01-23 MED ORDER — ENOXAPARIN SODIUM 40 MG/0.4ML ~~LOC~~ SOLN
40.0000 mg | SUBCUTANEOUS | Status: DC
  Administered 2016-01-23 – 2016-01-26 (×4): 40 mg via SUBCUTANEOUS
  Filled 2016-01-23 (×4): qty 0.4

## 2016-01-23 MED ORDER — TIOTROPIUM BROMIDE MONOHYDRATE 18 MCG IN CAPS
18.0000 ug | ORAL_CAPSULE | Freq: Every day | RESPIRATORY_TRACT | Status: DC
  Administered 2016-01-24 – 2016-01-26 (×3): 18 ug via RESPIRATORY_TRACT
  Filled 2016-01-23 (×2): qty 5

## 2016-01-23 MED ORDER — CLONAZEPAM 0.5 MG PO TABS
2.0000 mg | ORAL_TABLET | Freq: Once | ORAL | Status: AC
Start: 2016-01-23 — End: 2016-01-23
  Administered 2016-01-23: 2 mg via ORAL
  Filled 2016-01-23: qty 4

## 2016-01-23 MED ORDER — IOPAMIDOL (ISOVUE-300) INJECTION 61%
INTRAVENOUS | Status: AC
  Administered 2016-01-23: 75 mL
  Filled 2016-01-23: qty 75

## 2016-01-23 MED ORDER — SODIUM CHLORIDE 0.9 % IV SOLN: 250.0000 mL | INTRAVENOUS | Status: DC | PRN

## 2016-01-23 MED ORDER — INSULIN ASPART 100 UNIT/ML ~~LOC~~ SOLN
0.0000 [IU] | Freq: Three times a day (TID) | SUBCUTANEOUS | Status: DC
  Administered 2016-01-23: 2 [IU] via SUBCUTANEOUS
  Administered 2016-01-23: 5 [IU] via SUBCUTANEOUS
  Administered 2016-01-24 (×2): 2 [IU] via SUBCUTANEOUS
  Administered 2016-01-24 – 2016-01-26 (×4): 1 [IU] via SUBCUTANEOUS

## 2016-01-23 NOTE — ED Triage Notes (Signed)
Pt to ED from home by EMS c/o sob x 3 days unrelieved with inhaler. Hx of chf with bilateral edema. Initially ems heard wheezing and rales with sats 86%. Pt is on home oxygen at night and during the day as needed. Pt given 10mg  albuterol, 1mg  atrovent, and 125mg  solumedrol Pt speaking in full sentences. EMS VS 154/93, hr 96, cbg 131

## 2016-01-23 NOTE — ED Notes (Signed)
Pt ambulatory to restroom with assistance. Pt noted to have increased work of breathing during ambulation, sats 90% on room air

## 2016-01-23 NOTE — ED Notes (Signed)
Pt requesting Sprite and ginger ale which were provided. Also requesting pain medication for headache (denies improvement with morphine) and "more albuterol"

## 2016-01-23 NOTE — ED Provider Notes (Signed)
[facility] DEPT Provider Note   CSN: [id number] Arrival date & time: 01/23/16  0341     History   Chief Complaint Chief Complaint  Patient presents with  . Shortness of Breath    HPI Michele Owens is a 55 y.o. female.  Patient is a 55 year old female with past medical history of CHF, COPD, obesity, diabetes, gout. She presents today for evaluation of difficulty breathing. She states that she has been coughing up phlegm and this is been worsening over the past several days. She also has multiple other somatic complaints including headache, joint and muscle aches. She reports chronic knee pain due to arthritis in her knee. She reports she is due to have surgery, however this cannot be done secondary to her obesity.   The history is provided by the patient.  Shortness of Breath  This is a recurrent problem. The problem occurs continuously.The problem has not changed since onset.Associated symptoms include cough and sputum production. She has tried nothing for the symptoms. Associated medical issues include COPD, chronic lung disease and heart failure. Associated medical issues do not include CAD.    Past Medical History:  Diagnosis Date  . Anxiety   . Arthritis   . Asthma   . Chronic diastolic CHF (congestive heart failure) ([place])   . COPD (chronic obstructive pulmonary disease) ([facility])    Uses Oxygen at night  . Depression   . Diabetes mellitus without complication ([place])   . GERD (gastroesophageal reflux disease)   . Gout   . Headache    migraines  . History of DVT of lower extremity   . History of nuclear stress test    Myoview 2/17:  Low risk stress nuclear study with a small, moderate intensity, partially reversible inferior lateral defect consistent with small prior infarct and minimal peri-infarct ischemia; EF 68 with normal wall motion  . Hypertension   . Pneumonia     Patient Active Problem List   Diagnosis Date Noted  . Obesity hypoventilation syndrome ([place])  08/26/2015  . Chronic respiratory failure with hypoxia ([place]) 08/26/2015  . Chest pain 06/16/2015  . Dyslipidemia associated with type 2 diabetes mellitus ([place]) 04/24/2015  . Leukocytosis 04/24/2015  . Controlled type 2 diabetes mellitus without complication, without long-term current use of insulin ([place]) 04/24/2015  . Chronic diastolic CHF (congestive heart failure) ([place]) 04/24/2015  . Anxiety 04/24/2015  . Benign essential HTN 04/24/2015  . Normocytic anemia 04/24/2015  . COPD (chronic obstructive pulmonary disease) ([place]) 04/24/2015  . Acute respiratory failure with hypoxia ([place]) 12/07/2014  . COPD exacerbation ([place])   . OSA (obstructive sleep apnea) 05/10/2012  . Smoking 07/04/2011    Past Surgical History:  Procedure Laterality Date  . BUNIONECTOMY Bilateral   . CARDIAC CATHETERIZATION N/A 06/23/2015   Procedure: Right/Left Heart Cath and Coronary Angiography;  Surgeon: [name], MD;  Location: [facility];  Service: Cardiovascular;  Laterality: N/A;  . COLONOSCOPY WITH PROPOFOL N/A 12/15/2015   Procedure: COLONOSCOPY WITH PROPOFOL;  Surgeon: [name], MD;  Location: [facility];  Service: Endoscopy;  Laterality: N/A;  . TONSILLECTOMY    . TOTAL ABDOMINAL HYSTERECTOMY  07/14/10    OB History    No data available       Home Medications    Prior to Admission medications   Medication Sig Start Date End Date Taking? Authorizing Provider  acetaminophen (TYLENOL) 325 MG tablet Take 2 tablets (650 mg total) by mouth every 4 (four) hours as needed for headache or  mild pain. 06/24/15   Shirley Friar, PA-C  acetaminophen (TYLENOL) 500 MG tablet Take 1,000 mg by mouth every 6 (six) hours as needed for mild pain.    Historical Provider, MD  albuterol (PROVENTIL HFA;VENTOLIN HFA) 108 (90 BASE) MCG/ACT inhaler Inhale 1-2 puffs into the lungs every 6 (six) hours as needed for wheezing. 04/09/12   Billy Fischer, MD  albuterol (PROVENTIL) (2.5 MG/3ML) 0.083% nebulizer  solution Take 2.5 mg by nebulization every 6 (six) hours as needed for wheezing or shortness of breath.    Historical Provider, MD  clonazePAM (KLONOPIN) 2 MG tablet Take 2 mg by mouth 2 (two) times daily. Takes 2 tablets in the AM and 1 tablet in the afternoon    Historical Provider, MD  colchicine 0.6 MG tablet Take 0.6 mg by mouth 2 (two) times daily.    Historical Provider, MD  diclofenac sodium (VOLTAREN) 1 % GEL Apply 2 g topically 4 (four) times daily.    Historical Provider, MD  dicyclomine (BENTYL) 20 MG tablet Take 1 tablet (20 mg total) by mouth 2 (two) times daily as needed (abdominal pain/cramping). 11/28/15   Antonietta Breach, PA-C  EPINEPHrine 0.3 mg/0.3 mL IJ SOAJ injection Inject 0.3 mg into the muscle daily as needed (allergic reaction).    Historical Provider, MD  furosemide (LASIX) 20 MG tablet Take 3 tablets (60 mg total) by mouth 2 (two) times daily. 06/24/15   Shirley Friar, PA-C  HYDROcodone-acetaminophen (NORCO) 7.5-325 MG tablet take 1 tablet by mouth every 12 hours WHEN NECESSARY PAIN (MUST LAST 2 WEEKS); Pt received 28 pills total 09/21/15   Historical Provider, MD  Lactobacillus (LACTINEX) PACK Mix 1/2 packet with soft food and take twice a day for 5 days for diarrhea 11/28/15   Antonietta Breach, PA-C  loratadine (CLARITIN) 10 MG tablet Take 10 mg by mouth daily as needed.     Historical Provider, MD  metFORMIN (GLUCOPHAGE) 500 MG tablet Take 1 tablet (500 mg total) by mouth 2 (two) times daily with a meal. 12/11/14   [name], MD  metoprolol succinate (TOPROL-XL) 50 MG 24 hr tablet Take 1 tablet (50 mg total) by mouth daily. 03/19/12   Liliane Shi, PA-C  mirtazapine (REMERON) 15 MG tablet Take 15 mg by mouth at bedtime. 11/16/14   Historical Provider, MD  nicotine (NICODERM CQ - DOSED IN MG/24 HOURS) 14 mg/24hr patch Place 14 mg onto the skin daily.    Historical Provider, MD  nystatin cream (MYCOSTATIN) Apply 1 application topically 2 (two) times daily. 07/30/15    Historical Provider, MD  omeprazole (PRILOSEC) 40 MG capsule Take 40 mg by mouth daily. 04/13/15   Historical Provider, MD  potassium chloride SA (K-DUR,KLOR-CON) 20 MEQ tablet Take 20 mEq by mouth daily.    Historical Provider, MD  pravastatin (PRAVACHOL) 40 MG tablet Take 40 mg by mouth daily.  03/29/15   Historical Provider, MD  tiotropium (SPIRIVA) 18 MCG inhalation capsule Place 18 mcg into inhaler and inhale daily.    Historical Provider, MD  triamcinolone ointment (KENALOG) 0.1 % Apply 1 application topically 2 (two) times daily. 07/24/15   Historical Provider, MD    Family History Family History  Problem Relation Age of Onset  . Cancer Father   . Diabetes Mother     Social History Social History  Substance Use Topics  . Smoking status: Current Some Day Smoker    Packs/day: 0.50    Years: 41.00    Types: Cigarettes  .  Smokeless tobacco: Never Used     Comment: Starting to [employer] -- using Nicotine Patch  . Alcohol use 0.0 oz/week     Comment: may have a mixed drink once in a blue moon     Allergies   Bee venom; Nsaids; Ibuprofen; and Lamisil [terbinafine hcl]   Review of Systems Review of Systems  Respiratory: Positive for cough, sputum production and shortness of breath.   All other systems reviewed and are negative.    Physical Exam Updated Vital Signs BP 128/80   Pulse 95   Temp 99.6 F (37.6 C) (Axillary)   Resp 20   SpO2 97%   Physical Exam  Constitutional: She is oriented to person, place, and time. She appears well-developed and well-nourished. No distress.  HENT:  Head: Normocephalic and atraumatic.  Neck: Normal range of motion. Neck supple.  Cardiovascular: Normal rate and regular rhythm.  Exam reveals no gallop and no friction rub.   No murmur heard. Pulmonary/Chest: Effort normal and breath sounds normal. No respiratory distress. She has no wheezes.  Abdominal: Soft. Bowel sounds are normal. She exhibits no distension. There is no tenderness.    Musculoskeletal: Normal range of motion. She exhibits edema.  Neurological: She is alert and oriented to person, place, and time.  Skin: Skin is warm and dry. She is not diaphoretic.  Nursing note and vitals reviewed.    ED Treatments / Results  Labs (all labs ordered are listed, but only abnormal results are displayed) Labs Reviewed  COMPREHENSIVE METABOLIC PANEL  CBC WITH DIFFERENTIAL/PLATELET  BRAIN NATRIURETIC PEPTIDE    EKG  EKG Interpretation None       Radiology Dg Chest 2 View  Result Date: 01/23/2016 CLINICAL DATA:  55 y/o F; 3 days of shortness of breath with history of congestive heart failure and bilateral edema. EXAM: CHEST  2 VIEW COMPARISON:  08/09/2015 chest radiograph FINDINGS: Bulky opacification of the right hilum. Linear opacities at the left lung base. Diffusely prominent interstitial markings. Stable cardiac silhouette. No acute osseous abnormality is evident. IMPRESSION: Bulky opacification of right hilum may represent pneumonia or atypical edema but underlying lymphadenopathy/mass is also possible. CT of the chest is recommended for further characterization. These results were called by telephone at the time of interpretation on 01/23/2016 at 4:37 am to Dr. Veryl Speak , who verbally acknowledged these results. Electronically Signed   By: Kristine Garbe M.D.   On: 01/23/2016 04:37    Procedures Procedures (including critical care time)  Medications Ordered in ED Medications  morphine 4 MG/ML injection 4 mg (not administered)  ipratropium-albuterol (DUONEB) 0.5-2.5 (3) MG/3ML nebulizer solution 3 mL (not administered)     Initial Impression / Assessment and Plan / ED Course  I have reviewed the triage vital signs and the nursing notes.  Pertinent labs & imaging results that were available during my care of the patient were reviewed by me and considered in my medical decision making (see chart for details).  Clinical Course    Patient  presents with chest congestion and productive cough worsening over the past week. Her workup today reveals a white count of 22,000 and chest x-ray suggestive of pneumonia. There was the recommendation of the radiologist to obtain a CT scan of the chest to further rule out a lung mass or other pathology. This was done and there appears to be pulmonary vascular congestion/CHF. Clinically this appears to be a pneumonia and will be treated with Rocephin and Zithromax. She will also be  given Lasix for 1 appears to be a CHF exacerbation. A BNP will be added on. The patient will be admitted to the hospitalist service.  Final Clinical Impressions(s) / ED Diagnoses   Final diagnoses:  None    New Prescriptions New Prescriptions   No medications on file     Veryl Speak, MD 01/23/16 [phone]

## 2016-01-23 NOTE — ED Notes (Signed)
Ordered breakfast tray  

## 2016-01-23 NOTE — H&P (Signed)
History and Physical    Michele Owens BDZ:[id number] DOB: 1961/03/06 DOA: 01/23/2016   PCP: Barbette Merino, MD   Patient coming from/Resides with: Private residence/lives alone  Admission status: Inpatient/telemetry -medically necessary to stay a minimum 2 midnights to rule out impending and/or unexpected changes in physiologic status that may differ from initial evaluation performed in the ER and/or at time of admission. Admitted with acute on chronic hypoxemic respiratory failure secondary to both diastolic heart exacerbation and community-acquired pneumonia. She will require telemetry monitoring, IV Lasix every 12 hours, monitoring of respiratory status closely for at least the first 24-48 hours, IV antibiotics. Chest imaging also concerning for possible right pulmonary mass with repeat imaging recommended after treatment. There was also an incidental finding of multinodular thyroid goiter with enlarged right thyroid nodule with ultrasound of thyroid recommended.  **After admission additional workup revealed patient actually met criteria for a diagnosis of sepsis (see below) which has change the focus of care regarding heart failure management and has prompted the initiation of sepsis focused orders to include volume resuscitation and closer monitoring of lactic acid and other sepsis physiology parameters  Chief Complaint: Shortness of breath and cough  HPI: Michele Owens is a 55 y.o. female with medical history significant for  chronic respiratory failure secondary to asthmatic COPD, continued tobacco abuse, diabetes on oral agents, pulmonary hypertension with associated grade 2 diastolic dysfunction, hypertension, morbid obesity with associated sleep apnea and obesity hypoventilation syndrome, dyslipidemia, osteoarthritis and gout, anxiety disorder and normocytic anemia. Patient reports about 2-3 weeks ago she was having viral respiratory symptoms and went her primary care physician who prescribed  her "green pills". She is unsure if these were antibiotics. She improved slightly so returned to her physician who gave her more of these pills. At initial presentation with symptoms she reported brown productive cough which has not changed. She has not changed the frequency level of her home oxygen therapy. She has utilized her normal nebulizers and inhalers without improvement in symptoms. For the past 1 week she has noted increased lower extremity swelling which she attributes to not taking her gout pills. She states over the past 3 days her shortness of breath has worsened. EMS was called to the home were initially she was found to be wheezing audibly with bilateral crackles and O2 sats of 86% on room air. She was given albuterol neb with Atrovent, 125 mg of Solu-Medrol with improvement in her symptoms. She was able to speak in full sentences prior to transport.  ED Course:  Vital Signs: BP 123/67   Pulse 94   Temp 99.6 F (37.6 C) (Axillary)   Resp 21   SpO2 94%  2 view chest x-ray: Bulky opacification of the right hilum may represent pneumonia or atypical edema but underlying lymphadenopathy/mass is also possible with CT of the chest recommended CT of the chest with contrast: Additional finding of multinodular goiter with a dominant 3.4 cm nodule right thyroid; there were also bilateral linear densities with mild interstitial edema of the lungs without any significant airspace consolidation in these findings were felt to represent congestive heart failure. There were no no nodules masses or significant pleural effusions noted in the pulmonary parenchyma. Lab data: Sodium 143, potassium 3.9, chloride 100, CO2 34, BUN 9, creatinine 0.6, glucose 139, LFTs normal, BNP 63.3, WBCs 22,300 with neutrophils 83% and absolute neutrophils 18.5%, hemoglobin 10, platelets 315,000  Recent pathology: Patient underwent colonoscopy in September with several polyps removed that did not show evidence of  malignancy  Medications and treatments: Morphine 4 mg IV 2 doses, DuoNeb 1, Klonopin 2 mg 1  Review of Systems:  In addition to the HPI above,  No Fever-chills, myalgias or other constitutional symptoms No Headache, changes with Vision or hearing, new weakness, tingling, numbness in any extremity, dizziness, dysarthria or word finding difficulty, gait disturbance or imbalance, tremors or seizure activity No problems swallowing food or Liquids, indigestion/reflux, choking or coughing while eating, abdominal pain with or after eating No Chest pain, palpitations, orthopnea or DOE No Abdominal pain, N/V, melena,hematochezia, dark tarry stools, constipation No dysuria, malodorous urine, hematuria or flank pain No new skin rashes, lesions, masses or bruises, No new joint pains, aches, swelling or redness No recent unintentional weight gain or loss No polyuria, polydypsia or polyphagia   Past Medical History:  Diagnosis Date  . Anxiety   . Arthritis   . Asthma   . Chronic diastolic CHF (congestive heart failure) ([place])   . COPD (chronic obstructive pulmonary disease) ([facility])    Uses Oxygen at night  . Depression   . Diabetes mellitus without complication ([place])   . GERD (gastroesophageal reflux disease)   . Gout   . Headache    migraines  . History of DVT of lower extremity   . History of nuclear stress test    Myoview 2/17:  Low risk stress nuclear study with a small, moderate intensity, partially reversible inferior lateral defect consistent with small prior infarct and minimal peri-infarct ischemia; EF 68 with normal wall motion  . Hypertension   . Pneumonia     Past Surgical History:  Procedure Laterality Date  . BUNIONECTOMY Bilateral   . CARDIAC CATHETERIZATION N/A 06/23/2015   Procedure: Right/Left Heart Cath and Coronary Angiography;  Surgeon: [name], MD;  Location: [facility];  Service: Cardiovascular;  Laterality: N/A;  . COLONOSCOPY WITH PROPOFOL N/A  12/15/2015   Procedure: COLONOSCOPY WITH PROPOFOL;  Surgeon: [name], MD;  Location: [facility];  Service: Endoscopy;  Laterality: N/A;  . TONSILLECTOMY    . TOTAL ABDOMINAL HYSTERECTOMY  07/14/10    Social History   Social History  . Marital status: Single    Spouse name: N/A  . Number of children: N/A  . Years of education: N/A   Occupational History  . unemployeed    Social History Main Topics  . Smoking status: Current Some Day Smoker    Packs/day: 0.50    Years: 41.00    Types: Cigarettes  . Smokeless tobacco: Never Used     Comment: Starting to [employer] -- using Nicotine Patch  . Alcohol use 0.0 oz/week     Comment: may have a mixed drink once in a blue moon  . Drug use:     Types: Marijuana     Comment: 12/07/2014 "once in a blue moon"  As of 12/14/15 - none for 4 months  . Sexual activity: Yes   Other Topics Concern  . Not on file   Social History Narrative  . No narrative on file    Mobility: Utilizes either a cane or rolling walk or Work history: Disabled   Allergies  Allergen Reactions  . Bee Venom Swelling    "All over my body" (swelling)  . Nsaids Other (See Comments)    Per MD's orders   . Ibuprofen Rash    Severe rash  . Lamisil [Terbinafine Hcl] Rash    Pt states this causes her to "feel funny"    Family History  Problem Relation Age of Onset  . Cancer Father   . Diabetes Mother      Prior to Admission medications   Medication Sig Start Date End Date Taking? Authorizing Provider  acetaminophen (TYLENOL) 325 MG tablet Take 2 tablets (650 mg total) by mouth every 4 (four) hours as needed for headache or mild pain. 06/24/15   Shirley Friar, PA-C  acetaminophen (TYLENOL) 500 MG tablet Take 1,000 mg by mouth every 6 (six) hours as needed for mild pain.    Historical Provider, MD  albuterol (PROVENTIL HFA;VENTOLIN HFA) 108 (90 BASE) MCG/ACT inhaler Inhale 1-2 puffs into the lungs every 6 (six) hours as needed for wheezing. 04/09/12    Billy Fischer, MD  albuterol (PROVENTIL) (2.5 MG/3ML) 0.083% nebulizer solution Take 2.5 mg by nebulization every 6 (six) hours as needed for wheezing or shortness of breath.    Historical Provider, MD  clonazePAM (KLONOPIN) 2 MG tablet Take 2 mg by mouth 2 (two) times daily. Takes 2 tablets in the AM and 1 tablet in the afternoon    Historical Provider, MD  colchicine 0.6 MG tablet Take 0.6 mg by mouth 2 (two) times daily.    Historical Provider, MD  diclofenac sodium (VOLTAREN) 1 % GEL Apply 2 g topically 4 (four) times daily.    Historical Provider, MD  dicyclomine (BENTYL) 20 MG tablet Take 1 tablet (20 mg total) by mouth 2 (two) times daily as needed (abdominal pain/cramping). 11/28/15   Antonietta Breach, PA-C  EPINEPHrine 0.3 mg/0.3 mL IJ SOAJ injection Inject 0.3 mg into the muscle daily as needed (allergic reaction).    Historical Provider, MD  furosemide (LASIX) 20 MG tablet Take 3 tablets (60 mg total) by mouth 2 (two) times daily. 06/24/15   Shirley Friar, PA-C  HYDROcodone-acetaminophen (NORCO) 7.5-325 MG tablet take 1 tablet by mouth every 12 hours WHEN NECESSARY PAIN (MUST LAST 2 WEEKS); Pt received 28 pills total 09/21/15   Historical Provider, MD  Lactobacillus (LACTINEX) PACK Mix 1/2 packet with soft food and take twice a day for 5 days for diarrhea 11/28/15   Antonietta Breach, PA-C  loratadine (CLARITIN) 10 MG tablet Take 10 mg by mouth daily as needed.     Historical Provider, MD  metFORMIN (GLUCOPHAGE) 500 MG tablet Take 1 tablet (500 mg total) by mouth 2 (two) times daily with a meal. 12/11/14   [name], MD  metoprolol succinate (TOPROL-XL) 50 MG 24 hr tablet Take 1 tablet (50 mg total) by mouth daily. 03/19/12   Liliane Shi, PA-C  mirtazapine (REMERON) 15 MG tablet Take 15 mg by mouth at bedtime. 11/16/14   Historical Provider, MD  nicotine (NICODERM CQ - DOSED IN MG/24 HOURS) 14 mg/24hr patch Place 14 mg onto the skin daily.    Historical Provider, MD  nystatin cream  (MYCOSTATIN) Apply 1 application topically 2 (two) times daily. 07/30/15   Historical Provider, MD  omeprazole (PRILOSEC) 40 MG capsule Take 40 mg by mouth daily. 04/13/15   Historical Provider, MD  potassium chloride SA (K-DUR,KLOR-CON) 20 MEQ tablet Take 20 mEq by mouth daily.    Historical Provider, MD  pravastatin (PRAVACHOL) 40 MG tablet Take 40 mg by mouth daily.  03/29/15   Historical Provider, MD  tiotropium (SPIRIVA) 18 MCG inhalation capsule Place 18 mcg into inhaler and inhale daily.    Historical Provider, MD  triamcinolone ointment (KENALOG) 0.1 % Apply 1 application topically 2 (two) times daily. 07/24/15   Historical Provider, MD  Physical Exam: Vitals:   01/23/16 0530 01/23/16 0600 01/23/16 0630 01/23/16 0800  BP: 105/94 137/71 145/66 123/67  Pulse: 104 100 105 94  Resp: 17 (!) 33 23 21  Temp:      TempSrc:      SpO2: 95% 92% 94%       Constitutional: NAD, calm, comfortable and very animated during conversation Eyes: PERRL, lids and conjunctivae normal ENMT: Mucous membranes are moist. Posterior pharynx clear of any exudate or lesions.Normal dentition.  Neck: normal, supple, no masses, no thyromegaly Respiratory: clear to auscultation bilaterally, no wheezing, diminished at bases with scattered crackles. Normal respiratory effort. No accessory muscle use. 2 L oxygen Cardiovascular: Regular rate and rhythm, no murmurs / rubs / gallops. Bilateral nonpitting lower extremity edema. 2+ pedal pulses. No carotid bruits.  Abdomen: no tenderness, no masses palpated. No hepatosplenomegaly. Bowel sounds positive.  Musculoskeletal: no clubbing / cyanosis. No joint deformity upper and lower extremities. Good ROM, no contractures. Normal muscle tone.  Skin: no rashes, lesions, ulcers. No induration Neurologic: CN 2-12 grossly intact. Sensation intact, DTR normal. Strength 5/5 x all 4 extremities.  Psychiatric: Normal judgment and insight. Alert and oriented x 3. Normal mood.    Labs  on Admission: I have personally reviewed following labs and imaging studies  CBC:  Recent Labs Lab 01/23/16 0456  WBC 22.3*  NEUTROABS 18.5*  HGB 10.0*  HCT 34.8*  MCV 85.9  PLT 017   Basic Metabolic Panel:  Recent Labs Lab 01/23/16 0456  NA 143  K 3.9  CL 100*  CO2 34*  GLUCOSE 139*  BUN 9  CREATININE 0.60  CALCIUM 9.0   GFR: CrCl cannot be calculated (Unknown ideal weight.). Liver Function Tests:  Recent Labs Lab 01/23/16 0456  AST 17  ALT 18  ALKPHOS 83  BILITOT 0.6  PROT 6.9  ALBUMIN 3.6   No results for input(s): LIPASE, AMYLASE in the last 168 hours. No results for input(s): AMMONIA in the last 168 hours. Coagulation Profile: No results for input(s): INR, PROTIME in the last 168 hours. Cardiac Enzymes: No results for input(s): CKTOTAL, CKMB, CKMBINDEX, TROPONINI in the last 168 hours. BNP (last 3 results) No results for input(s): PROBNP in the last 8760 hours. HbA1C: No results for input(s): HGBA1C in the last 72 hours. CBG: No results for input(s): GLUCAP in the last 168 hours. Lipid Profile: No results for input(s): CHOL, HDL, LDLCALC, TRIG, CHOLHDL, LDLDIRECT in the last 72 hours. Thyroid Function Tests: No results for input(s): TSH, T4TOTAL, FREET4, T3FREE, THYROIDAB in the last 72 hours. Anemia Panel: No results for input(s): VITAMINB12, FOLATE, FERRITIN, TIBC, IRON, RETICCTPCT in the last 72 hours. Urine analysis:    Component Value Date/Time   COLORURINE YELLOW 11/27/2015 2148   APPEARANCEUR CLEAR 11/27/2015 2148   LABSPEC 1.011 11/27/2015 2148   PHURINE 7.5 11/27/2015 2148   GLUCOSEU NEGATIVE 11/27/2015 2148   HGBUR NEGATIVE 11/27/2015 2148   [place] NEGATIVE 11/27/2015 2148   [place] NEGATIVE 11/27/2015 2148   PROTEINUR NEGATIVE 11/27/2015 2148   UROBILINOGEN 0.2 02/28/2012 2004   NITRITE NEGATIVE 11/27/2015 2148   LEUKOCYTESUR NEGATIVE 11/27/2015 2148   Sepsis Labs: '@LABRCNTIP' (procalcitonin:4,lacticidven:4) )No  results found for this or any previous visit (from the past 240 hour(s)).   Radiological Exams on Admission: Dg Chest 2 View  Result Date: 01/23/2016 CLINICAL DATA:  55 y/o F; 3 days of shortness of breath with history of congestive heart failure and bilateral edema. EXAM: CHEST  2 VIEW COMPARISON:  08/09/2015  chest radiograph FINDINGS: Bulky opacification of the right hilum. Linear opacities at the left lung base. Diffusely prominent interstitial markings. Stable cardiac silhouette. No acute osseous abnormality is evident. IMPRESSION: Bulky opacification of right hilum may represent pneumonia or atypical edema but underlying lymphadenopathy/mass is also possible. CT of the chest is recommended for further characterization. These results were called by telephone at the time of interpretation on 01/23/2016 at 4:37 am to Dr. Veryl Speak , who verbally acknowledged these results. Electronically Signed   By: Kristine Garbe M.D.   On: 01/23/2016 04:37   Ct Chest W Contrast  Result Date: 01/23/2016 CLINICAL DATA:  Abnormal chest x-ray. Dizziness, headaches, chest pain, and cough over the last week. EXAM: CT CHEST WITH CONTRAST TECHNIQUE: Multidetector CT imaging of the chest was performed during intravenous contrast administration. CONTRAST:  24m ISOVUE-300 IOPAMIDOL (ISOVUE-300) INJECTION 61% COMPARISON:  One-view chest x-ray in 01/23/2016. One-view chest x-ray 08/09/2015. FINDINGS: Cardiovascular: Heart is mildly enlarged. There is prominence the pulmonary arteries and veins bilaterally. Mediastinum/Nodes: Sub cm mediastinal nodes are present. There is no significant mediastinal adenopathy. A prominent right axillary node appears benign. Multinodular thyroid goiter extends into the superior mediastinum. A dominant 3.4 cm nodule is present on the right. Incidental note is made of a lipoma within the left pectoralis muscle measuring at least 3.1 cm. There is no significant pericardial effusion.  Lungs/Pleura: Bilateral linear densities likely reflect atelectasis. There is mild interstitial edema as well. No significant airspace consolidation is present. There is no nodule or mass. No significant pleural effusion is present. Upper Abdomen: Limited imaging of the upper abdomen is unremarkable. Musculoskeletal: There is fusion of anterior osteophytes in the mid and lower thoracic spine. No focal lytic or blastic lesions are present. IMPRESSION: 1. Enlarged pulmonary arteries and veins bilaterally most compatible with pulmonary vascular congestion. Recommend follow-up chest x-ray to assure resolution of findings. If the chest x-ray does not resolve, follow-up CT the chest with contrast recommended in 1 months time to exclude any underlying adenopathy or mass lesion obscured by the congestion. 2. Bilateral areas of linear atelectasis and interstitial edema compatible with congestive heart failure. 3. Multinodular thyroid goiter. The dominant 3 cm nodule is present within the right lobe of the thyroid Recommend further evaluation with thyroid ultrasound. If patient is clinically hyperthyroid, consider nuclear medicine thyroid uptake and scan. Electronically Signed   By: CSan MorelleM.D.   On: 01/23/2016 07:40    EKG: (Independently reviewed) sinus rhythm with a ventricular rate of 97 bpm, QTC 463 ms, no ST segment or T-wave changes that would be consistent with ischemia, normal R-wave progression  Assessment/Plan Principal Problem:   Acute on chronic respiratory failure with hypoxemia 2/2:  CAP (community acquired pneumonia)  Asthma with COPD w/ exacerbation  Acute on chronic diastolic congestive heart failure/pulmonary hypertension, NYHA class II -Patient presents with ongoing respiratory symptoms for about 3 weeks that were treated with unknown medication by primary care physician with symptoms worsening over the past 3 days and now associated with lower extremity edema and mixed findings on  chest x-ray concerning for just of heart failure and pneumonia +/-right hilar mass -Patient has significant leukocytosis without fever and it is unclear whether unknown medication was actually prednisone which could cause stress demargination and elevation in white count and neutrophils-ck PCT and lactic acid for completeness of exam -ck urinary strep, HIV and respiratory viral panel/droplet precautions -Pneumonia order set initiated -Acute heart failure order set initiated -Obtain blood cultures then begin  empiric Rocephin and Zithromax -Symptom management with nebulizers, IV Solu-Medrol every 12 hours, incentive spirometry and mobilization -Takes Lasix 60 mg PO BID at home-will give Lasix 60 mg IV BID x one dose now then 4 additional doses and reevaluate -Strict intake and output with daily weights -Continue preadmission Lopressor-was not on ACE inhibitor prior to admission and since GFR is normal we'll begin low-dose lisinopril -Did not discuss with patient but likely has dietary indiscretion-consider nutritional evaluation -Repeat chest x-ray in a.m. to determine if masslike density in right hilum has improved after treatment for pneumonia and heart failure (of note, CT chest was read as no masses) -Had cardiac catheterization in March 2017 that showed normal coronaries, normal LVH but did reveal mild pulmonary hypertension -Last echocardiogram was in August 2016 with Doppler parameters consistent with grade 2 diastolic dysfunction, no mention of LVH, no valvular abnormalities and right ventricle size and function was normal   Sepsis -Patient initially presented with cough and pneumonia findings on chest x-ray, white count was greater than 12,000, with greater than 10% bands, initial respiratory rate varied from 24-33 breaths per minute. See below regarding volume resuscitation and other treatments  ** 916 am: Lactic acid has returned elevated at 3.2 with a Procalcitonin of 1.95 which is more  concerning for infectious etiology to patient's edematous changes in her lungs. Has been given 1 dose of IV Lasix 60 mg but for now we'll hold Lasix and instead give IV fluid hydration -she is hypertensive but since she was given Lasix and will actively diurese we need to institute sepsis protocol fluid boluses (total 4 liters based on wt 130 kg) to be followed by IVFs at 100/hr -I called Mrs Scheiderer back to update her on change in management of her status  Active Problems:   OSA (obstructive sleep apnea)/obesity hypoventilation syndrome -Intolerant to CPAP -Continue nasal cannula oxygen as above    Controlled type 2 diabetes mellitus without complication, without long-term current use of insulin  -Preadmission metformin on hold in acute setting -Hemoglobin A1c -Follow CBGs AC/HS and provide SSI    Benign essential HTN -Blood pressure controlled -Continue preadmission beta blocker -Low-dose lisinopril added this admission secondary to heart failure    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 -Incidental finding on CT chest at time of admission -TSH -Thyroid ultrasound ordered -If clinically hyperthyroid patient will eventually need nuclear medicine thyroid uptake and scan    Tobacco abuse -Patient admits to half a pack per day -Patient reports PCP recently had extensive counseling with her regarding absolute need to stop smoking that cigarettes were "killing me" and that "I'm young but he told me my lungs look like an 55 year old" -Reinforce previous tobacco cessation counseling    Morbid obesity/bilateral knee pain 2/2 osteoarthritis -Continue topical NSAIDs -PT evaluation -Patient just purchased new knee brace which is at home    Dyslipidemia associated with type 2 diabetes mellitus  -Continue Pravachol    Anxiety -Continue preadmission Klonopin    Normocytic anemia -Baseline hemoglobin around 11.4-12.4 -Current hemoglobin 10 and could be reflective of  hemodilution in setting of heart failure -Repeat CBC in a.m. after diuresis    History of gout -Current renal function is normal so we'll continue colchicine      DVT prophylaxis: Lovenox Code Status: Full Family Communication: Patient called both her mother and aunt requested a update them on her status which I did Disposition Plan: Anticipate discharge back to previous home environment once medically stable Consults called: None  ELLIS,ALLISON L. ANP-BC [facility] Pager [phone]   If 7PM-7AM, please contact night-coverage [email or web address] Password [email or web address]  01/23/2016, 8:29 AM

## 2016-01-23 NOTE — ED Notes (Signed)
Pt continually requesting home medications. Pt give "calm down pills" (klonopin) per request.  Pt taken to CT

## 2016-01-24 ENCOUNTER — Inpatient Hospital Stay (HOSPITAL_COMMUNITY): Payer: Medicaid Other

## 2016-01-24 ENCOUNTER — Other Ambulatory Visit (HOSPITAL_COMMUNITY): Payer: Medicaid Other

## 2016-01-24 DIAGNOSIS — I5033 Acute on chronic diastolic (congestive) heart failure: Secondary | ICD-10-CM

## 2016-01-24 LAB — GLUCOSE, CAPILLARY
GLUCOSE-CAPILLARY: 142 mg/dL — AB (ref 65–99)
Glucose-Capillary: 176 mg/dL — ABNORMAL HIGH (ref 65–99)
Glucose-Capillary: 168 mg/dL — ABNORMAL HIGH (ref 65–99)
Glucose-Capillary: 155 mg/dL — ABNORMAL HIGH (ref 65–99)

## 2016-01-24 LAB — BASIC METABOLIC PANEL
Anion gap: 8 (ref 5–15)
BUN: 9 mg/dL (ref 6–20)
CALCIUM: 8.8 mg/dL — AB (ref 8.9–10.3)
CHLORIDE: 102 mmol/L (ref 101–111)
CO2: 30 mmol/L (ref 22–32)
CREATININE: 0.42 mg/dL — AB (ref 0.44–1.00)
GFR calc non Af Amer: >60 mL/min (ref >60)
Glucose, Bld: 145 mg/dL — ABNORMAL HIGH (ref 65–99)
Potassium: 4.5 mmol/L (ref 3.5–5.1)
SODIUM: 140 mmol/L (ref 135–145)

## 2016-01-24 LAB — CBC WITH DIFFERENTIAL/PLATELET
BASOS PCT: 0 %
Basophils Absolute: 0 10*3/uL (ref 0.0–0.1)
EOS ABS: 0 10*3/uL (ref 0.0–0.7)
EOS PCT: 0 %
HCT: 33.4 % — ABNORMAL LOW (ref 36.0–46.0)
Hemoglobin: 9.3 g/dL — ABNORMAL LOW (ref 12.0–15.0)
LYMPHS ABS: 1.7 10*3/uL (ref 0.7–4.0)
Lymphocytes Relative: 6 %
MCH: 24.4 pg — AB (ref 26.0–34.0)
MCHC: 27.8 g/dL — ABNORMAL LOW (ref 30.0–36.0)
MCV: 87.7 fL (ref 78.0–100.0)
MONO ABS: 1.1 10*3/uL — AB (ref 0.1–1.0)
Monocytes Relative: 4 %
Neutro Abs: 25 10*3/uL — ABNORMAL HIGH (ref 1.7–7.7)
Neutrophils Relative %: 90 %
PLATELETS: 343 10*3/uL (ref 150–400)
RBC: 3.81 MIL/uL — AB (ref 3.87–5.11)
RDW: 16.6 % — AB (ref 11.5–15.5)
WBC: 27.8 10*3/uL — AB (ref 4.0–10.5)

## 2016-01-24 LAB — HEMOGLOBIN A1C
HEMOGLOBIN A1C: 6.4 % — AB (ref 4.8–5.6)
MEAN PLASMA GLUCOSE: 137 mg/dL

## 2016-01-24 LAB — HIV ANTIBODY (ROUTINE TESTING W REFLEX): HIV Screen 4th Generation wRfx: NONREACTIVE

## 2016-01-24 MED ORDER — FUROSEMIDE 10 MG/ML IJ SOLN
40.0000 mg | Freq: Two times a day (BID) | INTRAMUSCULAR | Status: DC
  Administered 2016-01-24 – 2016-01-26 (×4): 40 mg via INTRAVENOUS
  Filled 2016-01-24 (×5): qty 4

## 2016-01-24 NOTE — Progress Notes (Signed)
Pt's daughter was asking for a excuse note for being out of work due to her mother being in the hospital, her name is Madilee Gronau if u need to reach her [phone], thanks [place]

## 2016-01-24 NOTE — Progress Notes (Signed)
PT Cancellation Note  Patient Details Name: Michele Owens MRN: [id number] DOB: 09/19/1960   Cancelled Treatment:    Reason Eval/Treat Not Completed: Patient not medically ready;Pain limiting ability to participate (still having gouty flare up).  Will try later as time and pt allow.   Ramond Dial 01/24/2016, 10:57 AM    Mee Hives, PT MS Acute Rehab Dept. Number: [place] and [place]

## 2016-01-24 NOTE — Progress Notes (Signed)
Patient is very irritable in room. Nothing in particular triggers her irritation. Patient is loud with RN and all staff that enters room. Patient is still requesting a MD note for her daughter. RN has made her aware many times that only the MD will be able to write this, and he is aware. Patient complained that her lunch tray arrived cold, RN told her to order a new tray and RN would go pick it up. This was done. Patient also disagrees with her pain medicine regimen here at the hospital. She states that the RN is "telling her lies" about when she can have pain medicine. Patient has also refused to leave the room for her echo unless she goes to the bathroom. MD is aware. Will continue to monitor this patient.   Clarise Cruz, RN

## 2016-01-24 NOTE — Progress Notes (Signed)
PT Cancellation Note  Patient Details Name: Michele Owens MRN: [id number] DOB: May 31, 1960   Cancelled Treatment:    Reason Eval/Treat Not Completed: Patient at procedure or test/unavailable.  Try again in the AM.   Ramond Dial 01/24/2016, 2:19 PM    Mee Hives, PT MS Acute Rehab Dept. Number: [place] and [place]

## 2016-01-24 NOTE — Progress Notes (Signed)
  Echocardiogram 2D Echocardiogram has been attempted twice today. Will attempt again tomorrow.  Aggie Cosier 01/24/2016, 3:50 PM

## 2016-01-24 NOTE — Progress Notes (Signed)
PROGRESS NOTE    Michele Owens  [id number] DOB: 07-22-1960 DOA: 01/23/2016 PCP: Barbette Merino, MD   Brief Narrative:  55 yo female with diastolic heart failure, copd and obesity hypoventilation syndrome, presented with worsening dyspnea and volume overloaded, high lactate and leukocytosis. Suspected heart failure decompensation to rule out sepsis.   Assessment & Plan:   Principal Problem:   CAP (community acquired pneumonia) Active Problems:   Tobacco abuse   OSA (obstructive sleep apnea)   Dyslipidemia associated with type 2 diabetes mellitus ([place])   Controlled type 2 diabetes mellitus without complication, without long-term current use of insulin (HCC)   Acute on chronic diastolic CHF (congestive heart failure), NYHA class 2 (HCC)   Anxiety   Benign essential HTN   Normocytic anemia   Asthma with COPD with exacerbation (HCC)   Obesity hypoventilation syndrome (HCC)   Acute on chronic respiratory failure with hypoxia (HCC)   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   Pulmonary hypertension   Sepsis ([place])   1. Diastolic heart failure decompensation. Will resume diuresis with furosemide, will hold on IV fluids, strict in and out, daily weight. Patient is non compliant with salt restricted diet. Will continue on lisinopril.   2. Cardiogenic pulmonary edema. Chest film and ct personally reviewed, will continue diuresis with furosemide, will target negative fluid balance, will continue oxymetry monitoring and supplemental 02 per [place].   3. Leukocytosis rule out sepsis. No signs of infection, will continue antibiotic for now, but will plan to dc in am if patient continue stable, will follow on cell count, cultures and temp curve.   4.  T2DM. Will continue glucose cover and monitoring with iss, serum glucose [phone]  5. HTN. Will continue lisinopril, blood pressure systolic [id number] to [id number].   6. Anxiety Will continue cloanzepam.    DVT prophylaxis: lovenox  Code  Status: full  Family Communication: Patient's mother at her [place] Disposition Plan: Home    Consultants:   None   Procedures:   Antimicrobials:   Subjective: Patient with persistent dyspnea, no chest pain, no abdominal pain, no nausea or vomiting. Positive edema lower extremities.   Objective: Vitals:   01/24/16 0305 01/24/16 0847 01/24/16 0932 01/24/16 1135  BP: 126/68 135/71  131/82  Pulse: 80 81  86  Resp: 20 20  20   Temp: 97.5 F (36.4 C)   97.9 F (36.6 C)  TempSrc: Oral   Oral  SpO2: 91% 93% 94% 94%  Weight: (!) 141.5 kg (311 lb 15.2 oz)     Height:        Intake/Output Summary (Last 24 hours) at 01/24/16 1634 Last data filed at 01/24/16 1629  Gross per 24 hour  Intake             1590 ml  Output             1721 ml  Net             -131 ml   Filed Weights   01/23/16 0920 01/24/16 0305  Weight: 135.6 kg (298 lb 14.4 oz) (!) 141.5 kg (311 lb 15.2 oz)    Examination:  General exam: deconditioned E ENT. No pallor or icterus.  Respiratory system: Positive wheezing bilaterally, no rales or rhonchi. . Cardiovascular system: S1 & S2 heard, RRR. No JVD, murmurs, rubs, gallops or clicks. ++ edema lower extremities. Gastrointestinal system: Abdomen is nondistended, soft and nontender. No organomegaly or masses felt. Normal bowel sounds heard. Central nervous system: Alert and oriented.  No focal neurological deficits. Extremities: Symmetric 5 x 5 power. Skin: No rashes, lesions or ulcers     Data Reviewed: I have personally reviewed following labs and imaging studies  CBC:  Recent Labs Lab 01/23/16 0456 01/24/16 0421  WBC 22.3* 27.8*  NEUTROABS 18.5* 25.0*  HGB 10.0* 9.3*  HCT 34.8* 33.4*  MCV 85.9 87.7  PLT 315 [id number]   Basic Metabolic Panel:  Recent Labs Lab 01/23/16 0456 01/24/16 0421  NA 143 140  K 3.9 4.5  CL 100* 102  CO2 34* 30  GLUCOSE 139* 145*  BUN 9 9  CREATININE 0.60 0.42*  CALCIUM 9.0 8.8*   GFR: Estimated Creatinine  Clearance: 113.9 mL/min (by C-G formula based on SCr of 0.42 mg/dL (L)). Liver Function Tests:  Recent Labs Lab 01/23/16 0456  AST 17  ALT 18  ALKPHOS 83  BILITOT 0.6  PROT 6.9  ALBUMIN 3.6   No results for input(s): LIPASE, AMYLASE in the last 168 hours. No results for input(s): AMMONIA in the last 168 hours. Coagulation Profile:  Recent Labs Lab 01/23/16 1239  INR 1.10   Cardiac Enzymes: No results for input(s): CKTOTAL, CKMB, CKMBINDEX, TROPONINI in the last 168 hours. BNP (last 3 results) No results for input(s): PROBNP in the last 8760 hours. HbA1C:  Recent Labs  01/23/16 0808  HGBA1C 6.4*   CBG:  Recent Labs Lab 01/23/16 1641 01/23/16 2149 01/24/16 0618 01/24/16 1137 01/24/16 1559  GLUCAP 167* 192* 142* 176* 168*   Lipid Profile: No results for input(s): CHOL, HDL, LDLCALC, TRIG, CHOLHDL, LDLDIRECT in the last 72 hours. Thyroid Function Tests:  Recent Labs  01/23/16 0808  TSH 0.676   Anemia Panel: No results for input(s): VITAMINB12, FOLATE, FERRITIN, TIBC, IRON, RETICCTPCT in the last 72 hours. Sepsis Labs:  Recent Labs Lab 01/23/16 0808 01/23/16 1006 01/23/16 1640  PROCALCITON 1.95  --   --   LATICACIDVEN 3.2* 3.5* 2.2*    Recent Results (from the past 240 hour(s))  Respiratory Panel by PCR     Status: None   Collection Time: 01/23/16  7:47 AM  Result Value Ref Range Status   Adenovirus NOT DETECTED NOT DETECTED Final   Coronavirus 229E NOT DETECTED NOT DETECTED Final   Coronavirus HKU1 NOT DETECTED NOT DETECTED Final   Coronavirus NL63 NOT DETECTED NOT DETECTED Final   Coronavirus OC43 NOT DETECTED NOT DETECTED Final   Metapneumovirus NOT DETECTED NOT DETECTED Final   Rhinovirus / Enterovirus NOT DETECTED NOT DETECTED Final   Influenza A NOT DETECTED NOT DETECTED Final   Influenza B NOT DETECTED NOT DETECTED Final   Parainfluenza Virus 1 NOT DETECTED NOT DETECTED Final   Parainfluenza Virus 2 NOT DETECTED NOT DETECTED Final    Parainfluenza Virus 3 NOT DETECTED NOT DETECTED Final   Parainfluenza Virus 4 NOT DETECTED NOT DETECTED Final   Respiratory Syncytial Virus NOT DETECTED NOT DETECTED Final   Bordetella pertussis NOT DETECTED NOT DETECTED Final   Chlamydophila pneumoniae NOT DETECTED NOT DETECTED Final   Mycoplasma pneumoniae NOT DETECTED NOT DETECTED Final  Culture, blood (Routine X 2) w Reflex to ID Panel     Status: None (Preliminary result)   Collection Time: 01/23/16 10:06 AM  Result Value Ref Range Status   Specimen Description BLOOD RIGHT ASSIST CONTROL  Final   Special Requests BOTTLES DRAWN AEROBIC AND ANAEROBIC 5CC  Final   Culture NO GROWTH 1 DAY  Final   Report Status PENDING  Incomplete  Culture, blood (Routine X 2)  w Reflex to ID Panel     Status: None (Preliminary result)   Collection Time: 01/23/16 10:11 AM  Result Value Ref Range Status   Specimen Description BLOOD RIGHT ARM  Final   Special Requests BOTTLES DRAWN AEROBIC AND ANAEROBIC [place]  Final   Culture NO GROWTH 1 DAY  Final   Report Status PENDING  Incomplete         Radiology Studies: Dg Chest 2 View  Result Date: 01/24/2016 CLINICAL DATA:  Shortness of breath, fever, chronic diastolic CHF, asthma, diabetes mellitus EXAM: CHEST  2 VIEW COMPARISON:  01/23/2016 radiographic and CT exams FINDINGS: Enlargement of cardiac silhouette with slight pulmonary vascular congestion. Persistent BILATERAL hilar prominence corresponding to enlarged hilar vascular structures on prior CT. Mediastinal contours otherwise normal. Bibasilar atelectasis greater on LEFT. Improved pulmonary edema. Nondiagnostic lateral view. No gross pleural effusion or pneumothorax. Multilevel endplate spur formation thoracic spine. IMPRESSION: Enlargement of cardiac silhouette with pulmonary vascular congestion, including persistent prominent vascular structures at the pulmonary hila bilaterally. Improved pulmonary edema with persistent bibasilar atelectasis LEFT greater  than RIGHT. Electronically Signed   By: Lavonia Dana M.D.   On: 01/24/2016 14:25   Dg Chest 2 View  Result Date: 01/23/2016 CLINICAL DATA:  55 y/o F; 3 days of shortness of breath with history of congestive heart failure and bilateral edema. EXAM: CHEST  2 VIEW COMPARISON:  08/09/2015 chest radiograph FINDINGS: Bulky opacification of the right hilum. Linear opacities at the left lung base. Diffusely prominent interstitial markings. Stable cardiac silhouette. No acute osseous abnormality is evident. IMPRESSION: Bulky opacification of right hilum may represent pneumonia or atypical edema but underlying lymphadenopathy/mass is also possible. CT of the chest is recommended for further characterization. These results were called by telephone at the time of interpretation on 01/23/2016 at 4:37 am to Dr. Veryl Speak , who verbally acknowledged these results. Electronically Signed   By: Kristine Garbe M.D.   On: 01/23/2016 04:37   Ct Chest W Contrast  Result Date: 01/23/2016 CLINICAL DATA:  Abnormal chest x-ray. Dizziness, headaches, chest pain, and cough over the last week. EXAM: CT CHEST WITH CONTRAST TECHNIQUE: Multidetector CT imaging of the chest was performed during intravenous contrast administration. CONTRAST:  66mL ISOVUE-300 IOPAMIDOL (ISOVUE-300) INJECTION 61% COMPARISON:  One-view chest x-ray in 01/23/2016. One-view chest x-ray 08/09/2015. FINDINGS: Cardiovascular: Heart is mildly enlarged. There is prominence the pulmonary arteries and veins bilaterally. Mediastinum/Nodes: Sub cm mediastinal nodes are present. There is no significant mediastinal adenopathy. A prominent right axillary node appears benign. Multinodular thyroid goiter extends into the superior mediastinum. A dominant 3.4 cm nodule is present on the right. Incidental note is made of a lipoma within the left pectoralis muscle measuring at least 3.1 cm. There is no significant pericardial effusion. Lungs/Pleura: Bilateral linear  densities likely reflect atelectasis. There is mild interstitial edema as well. No significant airspace consolidation is present. There is no nodule or mass. No significant pleural effusion is present. Upper Abdomen: Limited imaging of the upper abdomen is unremarkable. Musculoskeletal: There is fusion of anterior osteophytes in the mid and lower thoracic spine. No focal lytic or blastic lesions are present. IMPRESSION: 1. Enlarged pulmonary arteries and veins bilaterally most compatible with pulmonary vascular congestion. Recommend follow-up chest x-ray to assure resolution of findings. If the chest x-ray does not resolve, follow-up CT the chest with contrast recommended in 1 months time to exclude any underlying adenopathy or mass lesion obscured by the congestion. 2. Bilateral areas of linear atelectasis and  interstitial edema compatible with congestive heart failure. 3. Multinodular thyroid goiter. The dominant 3 cm nodule is present within the right lobe of the thyroid Recommend further evaluation with thyroid ultrasound. If patient is clinically hyperthyroid, consider nuclear medicine thyroid uptake and scan. Electronically Signed   By: San Morelle M.D.   On: 01/23/2016 07:40   US Thyroid  Result Date: 01/24/2016 CLINICAL DATA:  Incidental on CT.  Thyroid nodules on chest CT. EXAM: THYROID ULTRASOUND TECHNIQUE: Ultrasound examination of the thyroid gland and adjacent soft tissues was performed. COMPARISON:  None. FINDINGS: Parenchymal Echotexture: Moderately heterogenous Estimated total number of nodules >/= 1 cm: 4 Number of spongiform nodules >/=  2 cm not described below (TR1): 0 Number of mixed cystic and solid nodules >/= 1.5 cm not described below (TR2): 0 _________________________________________________________ Isthmus: Measures 1.1 cm. No discrete nodules are identified within the thyroid isthmus. _________________________________________________________ Right lobe: Measures 5.8 x 3.5 x  3.7 cm. Nodule # 1: Location: Right; Mid Size: Measures 3.9 x 2.8 x 3.5 cm. Composition: solid/almost completely solid (2) Echogenicity: hypoechoic (2) Shape: not taller-than-wide (0) Margins: smooth (0) Echogenic foci: none (0) ACR TI-RADS total points: 4. ACR TI-RADS risk category: TR4 (4-6 points). ACR TI-RADS recommendations: **Given size (>/= 1.5 cm) and appearance, fine needle aspiration of this moderately suspicious nodule should be considered based on TI-RADS criteria. Nodule # 2: Location: Right; Inferior Size: 1.3 x 0.7 x 1.3 cm. Composition: solid/almost completely solid (2) Echogenicity: hypoechoic (2) Shape: not taller-than-wide (0) Margins: ill-defined (0) Echogenic foci: none (0) ACR TI-RADS total points: 4. ACR TI-RADS risk category: TR4 (4-6 points). ACR TI-RADS recommendations: *Given size (>/= 1 - 1.4 cm) and appearance, a follow-up ultrasound in 1 year should be considered based on TI-RADS criteria. Nodule # 3: Location: Right; Superior Size: Measures 1.5 x 1.4 x 1.1 cm. Composition: solid/almost completely solid (2) Echogenicity: hypoechoic (2) Shape: not taller-than-wide (0) Margins: ill-defined (0) Echogenic foci: punctate echogenic foci (3) and some peripheral calcifications. ACR TI-RADS total points: 7. ACR TI-RADS risk category: TR5 (>/= 7 points). ACR TI-RADS recommendations: **Given size (>/= 1.0 cm) and appearance, fine needle aspiration of this highly suspicious nodule should be considered based on TI-RADS criteria. _________________________________________________________ Left lobe: Measures 3.6 x 3.5 x 2.0 cm. Nodule # 4: Location: Left; Mid Size: Measures 2.0 x 1.7 x 1.6 cm. Composition: solid/almost completely solid (2) Echogenicity: isoechoic (1) Shape: not taller-than-wide (0) Margins: ill-defined (0) Echogenic foci: none (0) ACR TI-RADS total points: 3. ACR TI-RADS risk category: TR3 (3 points). ACR TI-RADS recommendations: *Given size (>/= 1.5 - 2.4 cm) and appearance, a  follow-up ultrasound in 1 year should be considered based on TI-RADS criteria. IMPRESSION: Multi nodular goiter. Nodules #1 and #3 in the right thyroid lobe both meet criteria for biopsy. Nodule #2 in the right thyroid lobe and Nodule #4 in left thyroid lobe both meet criteria for 1 year follow-up. The above is in keeping with the ACR TI-RADS recommendations - J Am Coll Radiol 2017;14:587-595. Electronically Signed   By: Markus Daft M.D.   On: 01/24/2016 14:16        Scheduled Meds: . azithromycin  500 mg Intravenous Q24H  . cefTRIAXone (ROCEPHIN)  IV  1 g Intravenous Q24H  . clonazePAM  2 mg Oral BID  . colchicine  0.6 mg Oral BID  . diclofenac sodium  2 g Topical QID  . enoxaparin (LOVENOX) injection  40 mg Subcutaneous Q24H  . insulin aspart  0-5 Units Subcutaneous QHS  .  insulin aspart  0-9 Units Subcutaneous TID WC  . lisinopril  2.5 mg Oral Daily  . methylPREDNISolone (SOLU-MEDROL) injection  60 mg Intravenous Q12H  . mirtazapine  15 mg Oral QHS  . nicotine  14 mg Transdermal Daily  . pantoprazole  40 mg Oral Daily  . potassium chloride SA  20 mEq Oral Daily  . tiotropium  18 mcg Inhalation Daily   Continuous Infusions:    LOS: 1 day       Michele Fullington Gerome Apley, MD [facility] Pager [phone]  If 7PM-7AM, please contact night-coverage [email or web address] Password [facility] 01/24/2016, 4:34 PM

## 2016-01-25 ENCOUNTER — Encounter (INDEPENDENT_AMBULATORY_CARE_PROVIDER_SITE_OTHER): Payer: Medicaid Other | Admitting: Podiatry

## 2016-01-25 ENCOUNTER — Other Ambulatory Visit (HOSPITAL_COMMUNITY): Payer: Medicaid Other

## 2016-01-25 ENCOUNTER — Inpatient Hospital Stay (HOSPITAL_COMMUNITY): Payer: Medicaid Other

## 2016-01-25 LAB — CBC WITH DIFFERENTIAL/PLATELET
BASOS PCT: 0 %
Basophils Absolute: 0 10*3/uL (ref 0.0–0.1)
EOS ABS: 0 10*3/uL (ref 0.0–0.7)
Eosinophils Relative: 0 %
HEMATOCRIT: 34.8 % — AB (ref 36.0–46.0)
HEMOGLOBIN: 9.8 g/dL — AB (ref 12.0–15.0)
LYMPHS ABS: 3.3 10*3/uL (ref 0.7–4.0)
Lymphocytes Relative: 15 %
MCH: 24.7 pg — ABNORMAL LOW (ref 26.0–34.0)
MCHC: 28.2 g/dL — AB (ref 30.0–36.0)
MCV: 87.7 fL (ref 78.0–100.0)
MONOS PCT: 5 %
Monocytes Absolute: 1.2 10*3/uL — ABNORMAL HIGH (ref 0.1–1.0)
NEUTROS ABS: 18.2 10*3/uL — AB (ref 1.7–7.7)
NEUTROS PCT: 80 %
Platelets: 346 10*3/uL (ref 150–400)
RBC: 3.97 MIL/uL (ref 3.87–5.11)
RDW: 16.6 % — ABNORMAL HIGH (ref 11.5–15.5)
WBC: 22.8 10*3/uL — AB (ref 4.0–10.5)

## 2016-01-25 LAB — GLUCOSE, CAPILLARY
GLUCOSE-CAPILLARY: 85 mg/dL (ref 65–99)
GLUCOSE-CAPILLARY: 148 mg/dL — AB (ref 65–99)
Glucose-Capillary: 106 mg/dL — ABNORMAL HIGH (ref 65–99)
Glucose-Capillary: 149 mg/dL — ABNORMAL HIGH (ref 65–99)

## 2016-01-25 LAB — BASIC METABOLIC PANEL
ANION GAP: 8 (ref 5–15)
BUN: 11 mg/dL (ref 6–20)
CHLORIDE: 98 mmol/L — AB (ref 101–111)
CO2: 36 mmol/L — AB (ref 22–32)
Calcium: 9.1 mg/dL (ref 8.9–10.3)
Creatinine, Ser: 0.52 mg/dL (ref 0.44–1.00)
GFR calc non Af Amer: >60 mL/min (ref >60)
Glucose, Bld: 95 mg/dL (ref 65–99)
Potassium: 4.2 mmol/L (ref 3.5–5.1)
SODIUM: 142 mmol/L (ref 135–145)

## 2016-01-25 LAB — PROCALCITONIN: Procalcitonin: 2.56 ng/mL

## 2016-01-25 MED ORDER — CARVEDILOL 3.125 MG PO TABS
3.1250 mg | ORAL_TABLET | Freq: Two times a day (BID) | ORAL | Status: DC
  Administered 2016-01-26 – 2016-01-27 (×3): 3.125 mg via ORAL
  Filled 2016-01-25 (×3): qty 1

## 2016-01-25 NOTE — Progress Notes (Signed)
Pt c/o pain in left hand at IV site.  Pt requested that this RN removed this PIV.  IV removed, IV consult order placed.

## 2016-01-25 NOTE — Care Management Note (Addendum)
Case Management Note  Patient Details  Name: Michele Owens MRN: [id number] Date of Birth: 1961/01/23  Subjective/Objective:     Admitted with Pneumonia               Action/Plan: Patient lives alone; PCP - Barbette Merino, MD; has private insurance with Medicaid; pharmacy of choice is [facility] - they deliver to her home. DME - home oxygen, cane,walker, 3:1, scooter and is planning to have knee surgery soon. For transportation, she use [employer] [facility]. She also stated that she has a nurses aide that helps her from [facility]. Patient is requesting a HHRN from [facility] stated " I do not want any other nurse and she said that she will always be there to help me." Referral placed with [facility]. CM will continue to follow for DCP.  Expected Discharge Date:     Possible 01/29/2016             Expected Discharge Plan:  Home/Self Care  Discharge planning Services  CM Consult  Status of Service:  In process, will continue to follow  Sherrilyn Rist [id number] 01/25/2016, 10:03 AM

## 2016-01-25 NOTE — Progress Notes (Signed)
PROGRESS NOTE    Michele Owens  [id number] DOB: 1960/05/14 DOA: 01/23/2016 PCP: Barbette Merino, MD   Brief Narrative: 55 yo female with diastolic heart failure, copd and obesity hypoventilation syndrome, presented with worsening dyspnea and volume overloaded, high lactate and leukocytosis. Suspected heart failure decompensation to rule out sepsis.   Assessment & Plan:   Principal Problem:   CAP (community acquired pneumonia) Active Problems:   Tobacco abuse   OSA (obstructive sleep apnea)   Dyslipidemia associated with type 2 diabetes mellitus ([place])   Controlled type 2 diabetes mellitus without complication, without long-term current use of insulin (HCC)   Acute on chronic diastolic CHF (congestive heart failure), NYHA class 2 (HCC)   Anxiety   Benign essential HTN   Normocytic anemia   Asthma with COPD with exacerbation (HCC)   Obesity hypoventilation syndrome (HCC)   Acute on chronic respiratory failure with hypoxia (HCC)   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   Pulmonary hypertension   Sepsis ([place])   1. Diastolic heart failure decompensation. Patient tolerating diuresis well, will continue diuresis with 5800 cc urine out put over last 24 hours with about 1746 cc negative fluid balance since admission, will continue diuresis with furosemide. Will continue on lisinopril for blood pressure control, will add low dose coreg.   2. Cardiogenic pulmonary edema. Will continue diuresis, to target negative fluid balance, will follow on chest film 2 views if continue to improve and no evidence of infiltrates, will plan to d.c antibiotic therapy.   3. Leukocytosis rule out sepsis. Patient received methylprednisolone 125 mg on admission, wbc trending down to 22 from 27. Cultures no growth, will follow chest film 2 view after diuresis, if no infiltrate will plan to dc antibiotics.   4.  T2DM. Continue glucose cover and monitoring with iss, serum glucose 155, 85, 148.  Patient tolerating po well.   5. HTN. Lisinopril 2,5 mg dalily, blood pressure continue to be stable, will add low dose coreg for heart failure.    6. Anxiety Will continue cloanzepam. No agitation or confusion.     DVT prophylaxis: lovenox  Code Status: full  Family Communication: Patient's mother at her [place] Disposition Plan: Home   Consultants:   None   Procedures:    Antimicrobials:      Subjective: Patient feeling better, dyspnea improved but still persistent, no cough, no chest pain. No nausea or vomiting.Persistent lower extremities edema.   Objective: Vitals:   01/24/16 2106 01/25/16 0432 01/25/16 0628 01/25/16 0831  BP: 140/82 112/65    Pulse: 72 73    Resp: 20 20    Temp: 97.8 F (36.6 C) 97.7 F (36.5 C)    TempSrc: Oral Oral    SpO2: 94% 93%  91%  Weight:   (!) 141.2 kg (311 lb 4.8 oz)   Height:        Intake/Output Summary (Last 24 hours) at 01/25/16 1106 Last data filed at 01/25/16 1056  Gross per 24 hour  Intake              855 ml  Output             4651 ml  Net            -3796 ml   Filed Weights   01/23/16 0920 01/24/16 0305 01/25/16 0628  Weight: 135.6 kg (298 lb 14.4 oz) (!) 141.5 kg (311 lb 15.2 oz) (!) 141.2 kg (311 lb 4.8 oz)    Examination:  General exam:  mild to moderate dyspnea. Not in pain. E ENT: mild conjunctival pallor, oral mucosa moist.  Respiratory system: scattered rales, no wheezing, or rhonochi.   Cardiovascular system: S1 & S2 heard, RRR. No JVD, murmurs, rubs, gallops or clicks. positive edema ++ to +++ pitting. Gastrointestinal system: Abdomen is nondistended, soft and nontender. No organomegaly or masses felt. Normal bowel sounds heard. Central nervous system: Alert and oriented. No focal neurological deficits. Extremities: Symmetric 5 x 5 power. Skin: No rashes, lesions or ulcers   Data Reviewed: I have personally reviewed following labs and imaging studies  CBC:  Recent Labs Lab 01/23/16 0456  01/24/16 0421 01/25/16 0805  WBC 22.3* 27.8* 22.8*  NEUTROABS 18.5* 25.0* 18.2*  HGB 10.0* 9.3* 9.8*  HCT 34.8* 33.4* 34.8*  MCV 85.9 87.7 87.7  PLT 315 343 [id number]   Basic Metabolic Panel:  Recent Labs Lab 01/23/16 0456 01/24/16 0421 01/25/16 0805  NA 143 140 142  K 3.9 4.5 4.2  CL 100* 102 98*  CO2 34* 30 36*  GLUCOSE 139* 145* 95  BUN 9 9 11   CREATININE 0.60 0.42* 0.52  CALCIUM 9.0 8.8* 9.1   GFR: Estimated Creatinine Clearance: 113.8 mL/min (by C-G formula based on SCr of 0.52 mg/dL). Liver Function Tests:  Recent Labs Lab 01/23/16 0456  AST 17  ALT 18  ALKPHOS 83  BILITOT 0.6  PROT 6.9  ALBUMIN 3.6   No results for input(s): LIPASE, AMYLASE in the last 168 hours. No results for input(s): AMMONIA in the last 168 hours. Coagulation Profile:  Recent Labs Lab 01/23/16 1239  INR 1.10   Cardiac Enzymes: No results for input(s): CKTOTAL, CKMB, CKMBINDEX, TROPONINI in the last 168 hours. BNP (last 3 results) No results for input(s): PROBNP in the last 8760 hours. HbA1C:  Recent Labs  01/23/16 0808  HGBA1C 6.4*   CBG:  Recent Labs Lab 01/24/16 0618 01/24/16 1137 01/24/16 1559 01/24/16 2047 01/25/16 0611  GLUCAP 142* 176* 168* 155* 85   Lipid Profile: No results for input(s): CHOL, HDL, LDLCALC, TRIG, CHOLHDL, LDLDIRECT in the last 72 hours. Thyroid Function Tests:  Recent Labs  01/23/16 0808  TSH 0.676   Anemia Panel: No results for input(s): VITAMINB12, FOLATE, FERRITIN, TIBC, IRON, RETICCTPCT in the last 72 hours. Sepsis Labs:  Recent Labs Lab 01/23/16 0808 01/23/16 1006 01/23/16 1640 01/25/16 0805  PROCALCITON 1.95  --   --  2.56  LATICACIDVEN 3.2* 3.5* 2.2*  --     Recent Results (from the past 240 hour(s))  Respiratory Panel by PCR     Status: None   Collection Time: 01/23/16  7:47 AM  Result Value Ref Range Status   Adenovirus NOT DETECTED NOT DETECTED Final   Coronavirus 229E NOT DETECTED NOT DETECTED Final    Coronavirus HKU1 NOT DETECTED NOT DETECTED Final   Coronavirus NL63 NOT DETECTED NOT DETECTED Final   Coronavirus OC43 NOT DETECTED NOT DETECTED Final   Metapneumovirus NOT DETECTED NOT DETECTED Final   Rhinovirus / Enterovirus NOT DETECTED NOT DETECTED Final   Influenza A NOT DETECTED NOT DETECTED Final   Influenza B NOT DETECTED NOT DETECTED Final   Parainfluenza Virus 1 NOT DETECTED NOT DETECTED Final   Parainfluenza Virus 2 NOT DETECTED NOT DETECTED Final   Parainfluenza Virus 3 NOT DETECTED NOT DETECTED Final   Parainfluenza Virus 4 NOT DETECTED NOT DETECTED Final   Respiratory Syncytial Virus NOT DETECTED NOT DETECTED Final   Bordetella pertussis NOT DETECTED NOT DETECTED Final   Chlamydophila pneumoniae  NOT DETECTED NOT DETECTED Final   Mycoplasma pneumoniae NOT DETECTED NOT DETECTED Final  Culture, blood (Routine X 2) w Reflex to ID Panel     Status: None (Preliminary result)   Collection Time: 01/23/16 10:06 AM  Result Value Ref Range Status   Specimen Description BLOOD RIGHT ASSIST CONTROL  Final   Special Requests BOTTLES DRAWN AEROBIC AND ANAEROBIC 5CC  Final   Culture NO GROWTH 1 DAY  Final   Report Status PENDING  Incomplete  Culture, blood (Routine X 2) w Reflex to ID Panel     Status: None (Preliminary result)   Collection Time: 01/23/16 10:11 AM  Result Value Ref Range Status   Specimen Description BLOOD RIGHT ARM  Final   Special Requests BOTTLES DRAWN AEROBIC AND ANAEROBIC [place]  Final   Culture NO GROWTH 1 DAY  Final   Report Status PENDING  Incomplete         Radiology Studies: Dg Chest 2 View  Result Date: 01/24/2016 CLINICAL DATA:  Shortness of breath, fever, chronic diastolic CHF, asthma, diabetes mellitus EXAM: CHEST  2 VIEW COMPARISON:  01/23/2016 radiographic and CT exams FINDINGS: Enlargement of cardiac silhouette with slight pulmonary vascular congestion. Persistent BILATERAL hilar prominence corresponding to enlarged hilar vascular structures on  prior CT. Mediastinal contours otherwise normal. Bibasilar atelectasis greater on LEFT. Improved pulmonary edema. Nondiagnostic lateral view. No gross pleural effusion or pneumothorax. Multilevel endplate spur formation thoracic spine. IMPRESSION: Enlargement of cardiac silhouette with pulmonary vascular congestion, including persistent prominent vascular structures at the pulmonary hila bilaterally. Improved pulmonary edema with persistent bibasilar atelectasis LEFT greater than RIGHT. Electronically Signed   By: Lavonia Dana M.D.   On: 01/24/2016 14:25   US Thyroid  Result Date: 01/24/2016 CLINICAL DATA:  Incidental on CT.  Thyroid nodules on chest CT. EXAM: THYROID ULTRASOUND TECHNIQUE: Ultrasound examination of the thyroid gland and adjacent soft tissues was performed. COMPARISON:  None. FINDINGS: Parenchymal Echotexture: Moderately heterogenous Estimated total number of nodules >/= 1 cm: 4 Number of spongiform nodules >/=  2 cm not described below (TR1): 0 Number of mixed cystic and solid nodules >/= 1.5 cm not described below (TR2): 0 _________________________________________________________ Isthmus: Measures 1.1 cm. No discrete nodules are identified within the thyroid isthmus. _________________________________________________________ Right lobe: Measures 5.8 x 3.5 x 3.7 cm. Nodule # 1: Location: Right; Mid Size: Measures 3.9 x 2.8 x 3.5 cm. Composition: solid/almost completely solid (2) Echogenicity: hypoechoic (2) Shape: not taller-than-wide (0) Margins: smooth (0) Echogenic foci: none (0) ACR TI-RADS total points: 4. ACR TI-RADS risk category: TR4 (4-6 points). ACR TI-RADS recommendations: **Given size (>/= 1.5 cm) and appearance, fine needle aspiration of this moderately suspicious nodule should be considered based on TI-RADS criteria. Nodule # 2: Location: Right; Inferior Size: 1.3 x 0.7 x 1.3 cm. Composition: solid/almost completely solid (2) Echogenicity: hypoechoic (2) Shape: not taller-than-wide  (0) Margins: ill-defined (0) Echogenic foci: none (0) ACR TI-RADS total points: 4. ACR TI-RADS risk category: TR4 (4-6 points). ACR TI-RADS recommendations: *Given size (>/= 1 - 1.4 cm) and appearance, a follow-up ultrasound in 1 year should be considered based on TI-RADS criteria. Nodule # 3: Location: Right; Superior Size: Measures 1.5 x 1.4 x 1.1 cm. Composition: solid/almost completely solid (2) Echogenicity: hypoechoic (2) Shape: not taller-than-wide (0) Margins: ill-defined (0) Echogenic foci: punctate echogenic foci (3) and some peripheral calcifications. ACR TI-RADS total points: 7. ACR TI-RADS risk category: TR5 (>/= 7 points). ACR TI-RADS recommendations: **Given size (>/= 1.0 cm) and appearance, fine needle  aspiration of this highly suspicious nodule should be considered based on TI-RADS criteria. _________________________________________________________ Left lobe: Measures 3.6 x 3.5 x 2.0 cm. Nodule # 4: Location: Left; Mid Size: Measures 2.0 x 1.7 x 1.6 cm. Composition: solid/almost completely solid (2) Echogenicity: isoechoic (1) Shape: not taller-than-wide (0) Margins: ill-defined (0) Echogenic foci: none (0) ACR TI-RADS total points: 3. ACR TI-RADS risk category: TR3 (3 points). ACR TI-RADS recommendations: *Given size (>/= 1.5 - 2.4 cm) and appearance, a follow-up ultrasound in 1 year should be considered based on TI-RADS criteria. IMPRESSION: Multi nodular goiter. Nodules #1 and #3 in the right thyroid lobe both meet criteria for biopsy. Nodule #2 in the right thyroid lobe and Nodule #4 in left thyroid lobe both meet criteria for 1 year follow-up. The above is in keeping with the ACR TI-RADS recommendations - J Am Coll Radiol 2017;14:587-595. Electronically Signed   By: Markus Daft M.D.   On: 01/24/2016 14:16        Scheduled Meds: . azithromycin  500 mg Intravenous Q24H  . cefTRIAXone (ROCEPHIN)  IV  1 g Intravenous Q24H  . clonazePAM  2 mg Oral BID  . colchicine  0.6 mg Oral BID  .  diclofenac sodium  2 g Topical QID  . enoxaparin (LOVENOX) injection  40 mg Subcutaneous Q24H  . furosemide  40 mg Intravenous BID  . insulin aspart  0-5 Units Subcutaneous QHS  . insulin aspart  0-9 Units Subcutaneous TID WC  . lisinopril  2.5 mg Oral Daily  . mirtazapine  15 mg Oral QHS  . nicotine  14 mg Transdermal Daily  . pantoprazole  40 mg Oral Daily  . tiotropium  18 mcg Inhalation Daily   Continuous Infusions:    LOS: 2 days      Sevyn Paredez Gerome Apley, MD [facility] Pager [phone]  If 7PM-7AM, please contact night-coverage [email or web address] Password [email or web address] 01/25/2016, 11:06 AM

## 2016-01-25 NOTE — Progress Notes (Signed)
This encounter was created in error - please disregard.

## 2016-01-25 NOTE — Evaluation (Signed)
Physical Therapy Evaluation Patient Details Name: Michele Owens MRN: [id number] DOB: [birth year] Today's Date: 01/25/2016   History of Present Illness  pt is a 55 y/o femal with h/o COPD, obesity hypoventilation syndrome, continued tobacco abuse, CHF, HTN presenting with worsening dyspnea, volume overload and cough.  Xray suggestive of PNA.  Clinical Impression  Pt admitted with/for SOB and cough.  Imaging suggestive of PNA.Marland Kitchen  Pt currently limited functionally due to the problems listed below.  (see problems list.)  Pt will benefit from PT to maximize function and safety to be able to get home safely with available assist.     Follow Up Recommendations No PT follow up    Equipment Recommendations  None recommended by PT    Recommendations for Other Services       Precautions / Restrictions Precautions Precautions: Fall      Mobility  Bed Mobility Overal bed mobility: Needs Assistance Bed Mobility: Supine to Sit;Sit to Supine     Supine to sit: Supervision;HOB elevated Sit to supine: Supervision;HOB elevated   General bed mobility comments: pt dictating what was going to happen to help her in and out of bed. No assist need with bed elevated so high.  Transfers Overall transfer level: Needs assistance   Transfers: Sit to/from Stand Sit to Stand: Supervision            Ambulation/Gait Ambulation/Gait assistance: Supervision Ambulation Distance (Feet): 180 Feet (with one standing rest break at half way point) Assistive device: None Gait Pattern/deviations: Step-through pattern   Gait velocity interpretation: at or above normal speed for age/gender General Gait Details: generally steady, but with mild dyspnea, SpO2 down to 81-84% and EHR 93 bpm.  Returned to room and on 2L [place] pt return to 91% , HR in the 80's in <1 min  Stairs            Wheelchair Mobility    Modified Rankin (Stroke Patients Only)       Balance Overall balance assessment: Needs  assistance   Sitting balance-Leahy Scale: Good       Standing balance-Leahy Scale: Good                               Pertinent Vitals/Pain Pain Assessment: No/denies pain    Home Living Family/patient expects to be discharged to:: Private residence   Available Help at Discharge: Personal care attendant Type of Home: Apartment Home Access: Level entry     Home Layout: One level Home Equipment: Cane - single point;Cane - quad      Prior Function Level of Independence: Independent with assistive device(s);Needs assistance   Gait / Transfers Assistance Needed: using cane  ADL's / Homemaking Assistance Needed: Aide assists M-F ~3 hours per day and prn on Saturdays. Pt reports she would have aide assist with LB ADLs prn. Aide would assist with shower transfers.   Comments: uses cane PRN     Hand Dominance   Dominant Hand: Right    Extremity/Trunk Assessment   Upper Extremity Assessment: Defer to OT evaluation (generally functional)           Lower Extremity Assessment: Overall WFL for tasks assessed;Generalized weakness (proximal weakness.)         Communication   Communication: No difficulties  Cognition Arousal/Alertness: Awake/alert Behavior During Therapy: WFL for tasks assessed/performed;Agitated Overall Cognitive Status: Within Functional Limits for tasks assessed  General Comments      Exercises     Assessment/Plan    PT Assessment Patient needs continued PT services  PT Problem List Decreased strength;Decreased activity tolerance;Decreased mobility;Cardiopulmonary status limiting activity          PT Treatment Interventions Gait training;Functional mobility training;Therapeutic activities;Patient/family education;DME instruction    PT Goals (Current goals can be found in the Care Plan section)  Acute Rehab PT Goals Patient Stated Goal: pt didn't participate PT Goal Formulation: With patient Time  For Goal Achievement: 02/01/16 Potential to Achieve Goals: Good    Frequency Min 3X/week   Barriers to discharge Decreased caregiver support lives alone with only PCA assist for 3 hours /day    Co-evaluation               End of Session Equipment Utilized During Treatment: Oxygen Activity Tolerance: Patient tolerated treatment well;Other (comment) (limited by decreased SPo2) Patient left: in bed;with call bell/phone within reach Nurse Communication: Mobility status         Time: 1650-1720 PT Time Calculation (min) (ACUTE ONLY): 30 min   Charges:   PT Evaluation $PT Eval Moderate Complexity: 1 Procedure PT Treatments $Gait Training: 8-22 mins   PT G Codes:        Monseratt Ledin, Tessie Fass 01/25/2016, 5:37 PM 01/25/2016  Donnella Sham, PT [phone]  (pager)
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
## 2016-01-26 DIAGNOSIS — I10 Essential (primary) hypertension: Secondary | ICD-10-CM

## 2016-01-26 DIAGNOSIS — J189 Pneumonia, unspecified organism: Secondary | ICD-10-CM

## 2016-01-26 DIAGNOSIS — J45901 Unspecified asthma with (acute) exacerbation: Secondary | ICD-10-CM

## 2016-01-26 DIAGNOSIS — I5031 Acute diastolic (congestive) heart failure: Secondary | ICD-10-CM

## 2016-01-26 DIAGNOSIS — D649 Anemia, unspecified: Secondary | ICD-10-CM

## 2016-01-26 DIAGNOSIS — E042 Nontoxic multinodular goiter: Secondary | ICD-10-CM

## 2016-01-26 DIAGNOSIS — J9621 Acute and chronic respiratory failure with hypoxia: Secondary | ICD-10-CM

## 2016-01-26 DIAGNOSIS — E119 Type 2 diabetes mellitus without complications: Secondary | ICD-10-CM

## 2016-01-26 DIAGNOSIS — J441 Chronic obstructive pulmonary disease with (acute) exacerbation: Secondary | ICD-10-CM

## 2016-01-26 DIAGNOSIS — F419 Anxiety disorder, unspecified: Secondary | ICD-10-CM

## 2016-01-26 LAB — BASIC METABOLIC PANEL
Anion gap: 9 (ref 5–15)
BUN: 13 mg/dL (ref 6–20)
CHLORIDE: 95 mmol/L — AB (ref 101–111)
CO2: 36 mmol/L — ABNORMAL HIGH (ref 22–32)
Calcium: 8.8 mg/dL — ABNORMAL LOW (ref 8.9–10.3)
Creatinine, Ser: 0.55 mg/dL (ref 0.44–1.00)
GFR calc Af Amer: >60 mL/min (ref >60)
GLUCOSE: 106 mg/dL — AB (ref 65–99)
POTASSIUM: 3.7 mmol/L (ref 3.5–5.1)
SODIUM: 140 mmol/L (ref 135–145)

## 2016-01-26 LAB — CBC WITH DIFFERENTIAL/PLATELET
Basophils Absolute: 0 10*3/uL (ref 0.0–0.1)
Basophils Relative: 0 %
EOS ABS: 0.1 10*3/uL (ref 0.0–0.7)
EOS PCT: 1 %
HCT: 35.9 % — ABNORMAL LOW (ref 36.0–46.0)
Hemoglobin: 10.3 g/dL — ABNORMAL LOW (ref 12.0–15.0)
LYMPHS ABS: 3.2 10*3/uL (ref 0.7–4.0)
LYMPHS PCT: 22 %
MCH: 24.7 pg — AB (ref 26.0–34.0)
MCHC: 28.7 g/dL — ABNORMAL LOW (ref 30.0–36.0)
MCV: 86.1 fL (ref 78.0–100.0)
MONO ABS: 0.8 10*3/uL (ref 0.1–1.0)
Monocytes Relative: 6 %
Neutro Abs: 10.3 10*3/uL — ABNORMAL HIGH (ref 1.7–7.7)
Neutrophils Relative %: 71 %
PLATELETS: 321 10*3/uL (ref 150–400)
RBC: 4.17 MIL/uL (ref 3.87–5.11)
RDW: 16.3 % — AB (ref 11.5–15.5)
WBC: 14.5 10*3/uL — AB (ref 4.0–10.5)

## 2016-01-26 LAB — GLUCOSE, CAPILLARY
GLUCOSE-CAPILLARY: 110 mg/dL — AB (ref 65–99)
GLUCOSE-CAPILLARY: 130 mg/dL — AB (ref 65–99)
GLUCOSE-CAPILLARY: 159 mg/dL — AB (ref 65–99)
Glucose-Capillary: 126 mg/dL — ABNORMAL HIGH (ref 65–99)

## 2016-01-26 MED ORDER — CEPHALEXIN 500 MG PO CAPS: 500.0000 mg | ORAL_CAPSULE | Freq: Two times a day (BID) | ORAL | Status: DC

## 2016-01-26 MED ORDER — CEPHALEXIN 500 MG PO CAPS
500.0000 mg | ORAL_CAPSULE | Freq: Two times a day (BID) | ORAL | Status: DC
  Administered 2016-01-27: 500 mg via ORAL
  Filled 2016-01-26: qty 1

## 2016-01-26 MED ORDER — FUROSEMIDE 40 MG PO TABS
40.0000 mg | ORAL_TABLET | Freq: Two times a day (BID) | ORAL | Status: DC
  Administered 2016-01-26 – 2016-01-27 (×2): 40 mg via ORAL
  Filled 2016-01-26 (×2): qty 1

## 2016-01-26 NOTE — Progress Notes (Signed)
PROGRESS NOTE    Michele Owens  [id number] DOB: 07-Sep-1960 DOA: 01/23/2016 PCP: Barbette Merino, MD   Chief Complaint  Patient presents with  . Shortness of Breath    Brief Narrative:  55 yo female with diastolic heart failure, copd and obesity hypoventilation syndrome, presented with worsening dyspnea and volume overloaded, high lactate and leukocytosis. Suspected heart failure decompensation to rule out sepsis.   Assessment & Plan   Acute Diastolic heart failure/pulmonary edema -Echocardiogram August 2016 showed an EF of 0000000, grade 2 diastolic dysfunction -On [id number] Chest x-ray showed pulmonary vascular congestion -On 10/17, chest x-ray did show improvement -Patient was initially placed on IV Lasix, will transition her back to by mouth. -Continue to monitor daily weights, intake and output  Leukocytosis, rule out sepsis -Patient received methylprednisolone 725 mg on admission, the VPCs are trending downward -Culture show no growth to date -Currently afebrile -Patient was placed on them. Antibiotics, will continue Keflex for an additional day.  Type 2 diabetes mellitus -Continue insulin sliding scale CBG monitoring  Essential hypertension -Continue lisinopril, Coreg -Transition to by mouth Lasix  Anxiety -Continue clonazepam  Chronic pain -Patient asking for refill on her prescription medications. Explained that she will need to follow-up with her primary care physician or prescribing physician to obtain refill.  Multinodular goiter -Patient did have ultrasound for thyroid. Not entirely sure what brought this on. -Patient will need to have biopsy done as an outpatient.  DVT Prophylaxis  lovenox  Code Status: Full  Family Communication: None at bedside  Disposition Plan: Admitted. Likely discharge to home in 24 hours.   Consultants None  Procedures  Thyroid US   Antibiotics   Anti-infectives    Start     Dose/Rate Route Frequency Ordered Stop   01/27/16 1000  cephALEXin (KEFLEX) capsule 500 mg     500 mg Oral Every 12 hours 01/26/16 0950 01/28/16 0959   01/26/16 1000  cephALEXin (KEFLEX) capsule 500 mg  Status:  Discontinued     500 mg Oral Every 12 hours 01/26/16 0949 01/26/16 0950   01/23/16 0830  cefTRIAXone (ROCEPHIN) 1 g in dextrose 5 % 50 mL IVPB  Status:  Discontinued     1 g 100 mL/hr over 30 Minutes Intravenous Every 24 hours 01/23/16 0816 01/26/16 0949   01/23/16 0830  azithromycin (ZITHROMAX) 500 mg in dextrose 5 % 250 mL IVPB  Status:  Discontinued     500 mg 250 mL/hr over 60 Minutes Intravenous Every 24 hours 01/23/16 0816 01/26/16 0949   01/23/16 0730  cefTRIAXone (ROCEPHIN) injection 1 g  Status:  Discontinued     1 g Intramuscular  Once 01/23/16 0723 01/23/16 0814   01/23/16 0730  azithromycin (ZITHROMAX) 500 mg in dextrose 5 % 250 mL IVPB  Status:  Discontinued     500 mg 250 mL/hr over 60 Minutes Intravenous  Once 01/23/16 0723 01/23/16 [id number]      Subjective:   Michele Owens seen and examined today.  Patient does complain of right arm pain where her IV site is placed. She denies any chest pain or shortness of breath this time. Feels her breathing has improved. Patient does ask for prescription for her narcotic medications. Currently denies any abdominal pain, nausea vomiting, diarrhea or constipation.  Objective:   Vitals:   01/25/16 1228 01/25/16 2040 01/26/16 0500 01/26/16 1156  BP: 138/76 (!) 145/79 (!) 142/70 133/69  Pulse: 83 79 72 77  Resp: 18 18 18 20   Temp: 97.8 F (36.6 C) 97.9  F (36.6 C) 98.3 F (36.8 C) 98.6 F (37 C)  TempSrc: Oral Oral Oral Oral  SpO2: 93% 95% 97% 91%  Weight:   (!) 140.8 kg (310 lb 8 oz)   Height:        Intake/Output Summary (Last 24 hours) at 01/26/16 1438 Last data filed at 01/26/16 1008  Gross per 24 hour  Intake             2520 ml  Output             3200 ml  Net             -680 ml   Filed Weights   01/24/16 0305 01/25/16 0628 01/26/16 0500  Weight:  (!) 141.5 kg (311 lb 15.2 oz) (!) 141.2 kg (311 lb 4.8 oz) (!) 140.8 kg (310 lb 8 oz)    Exam  General: Well developed, well nourished, NAD, appears stated age  105: NCAT,mucous membranes moist.   Cardiovascular: S1 S2 auscultated, no rubs, murmurs or gallops. Regular rate and rhythm.  Respiratory: Clear to auscultation bilaterally with equal chest rise  Abdomen: Soft, obese, nontender, nondistended, + bowel sounds  Extremities: warm dry without cyanosis clubbing. 2+LE edema  Neuro: AAOx3, nonfocal  Psych: Normal affect and demeanor with intact judgement and insight   Data Reviewed: I have personally reviewed following labs and imaging studies  CBC:  Recent Labs Lab 01/23/16 0456 01/24/16 0421 01/25/16 0805 01/26/16 0553  WBC 22.3* 27.8* 22.8* 14.5*  NEUTROABS 18.5* 25.0* 18.2* 10.3*  HGB 10.0* 9.3* 9.8* 10.3*  HCT 34.8* 33.4* 34.8* 35.9*  MCV 85.9 87.7 87.7 86.1  PLT 315 343 346 [id number]   Basic Metabolic Panel:  Recent Labs Lab 01/23/16 0456 01/24/16 0421 01/25/16 0805 01/26/16 0553  NA 143 140 142 140  K 3.9 4.5 4.2 3.7  CL 100* 102 98* 95*  CO2 34* 30 36* 36*  GLUCOSE 139* 145* 95 106*  BUN 9 9 11 13   CREATININE 0.60 0.42* 0.52 0.55  CALCIUM 9.0 8.8* 9.1 8.8*   GFR: Estimated Creatinine Clearance: 113.5 mL/min (by C-G formula based on SCr of 0.55 mg/dL). Liver Function Tests:  Recent Labs Lab 01/23/16 0456  AST 17  ALT 18  ALKPHOS 83  BILITOT 0.6  PROT 6.9  ALBUMIN 3.6   No results for input(s): LIPASE, AMYLASE in the last 168 hours. No results for input(s): AMMONIA in the last 168 hours. Coagulation Profile:  Recent Labs Lab 01/23/16 1239  INR 1.10   Cardiac Enzymes: No results for input(s): CKTOTAL, CKMB, CKMBINDEX, TROPONINI in the last 168 hours. BNP (last 3 results) No results for input(s): PROBNP in the last 8760 hours. HbA1C: No results for input(s): HGBA1C in the last 72 hours. CBG:  Recent Labs Lab 01/25/16 1148  01/25/16 1717 01/25/16 1955 01/26/16 0614 01/26/16 1145  GLUCAP 148* 106* 149* 110* 126*   Lipid Profile: No results for input(s): CHOL, HDL, LDLCALC, TRIG, CHOLHDL, LDLDIRECT in the last 72 hours. Thyroid Function Tests: No results for input(s): TSH, T4TOTAL, FREET4, T3FREE, THYROIDAB in the last 72 hours. Anemia Panel: No results for input(s): VITAMINB12, FOLATE, FERRITIN, TIBC, IRON, RETICCTPCT in the last 72 hours. Urine analysis:    Component Value Date/Time   COLORURINE YELLOW 01/23/2016 1225   APPEARANCEUR CLEAR 01/23/2016 1225   LABSPEC 1.021 01/23/2016 1225   PHURINE 5.0 01/23/2016 1225   GLUCOSEU 100 (A) 01/23/2016 1225   HGBUR NEGATIVE 01/23/2016 [place] 01/23/2016 1225  KETONESUR NEGATIVE 01/23/2016 [place] 01/23/2016 1225   UROBILINOGEN 0.2 02/28/2012 2004   NITRITE NEGATIVE 01/23/2016 1225   LEUKOCYTESUR NEGATIVE 01/23/2016 1225   Sepsis Labs: @LABRCNTIP (procalcitonin:4,lacticidven:4)  ) Recent Results (from the past 240 hour(s))  Respiratory Panel by PCR     Status: None   Collection Time: 01/23/16  7:47 AM  Result Value Ref Range Status   Adenovirus NOT DETECTED NOT DETECTED Final   Coronavirus 229E NOT DETECTED NOT DETECTED Final   Coronavirus HKU1 NOT DETECTED NOT DETECTED Final   Coronavirus NL63 NOT DETECTED NOT DETECTED Final   Coronavirus OC43 NOT DETECTED NOT DETECTED Final   Metapneumovirus NOT DETECTED NOT DETECTED Final   Rhinovirus / Enterovirus NOT DETECTED NOT DETECTED Final   Influenza A NOT DETECTED NOT DETECTED Final   Influenza B NOT DETECTED NOT DETECTED Final   Parainfluenza Virus 1 NOT DETECTED NOT DETECTED Final   Parainfluenza Virus 2 NOT DETECTED NOT DETECTED Final   Parainfluenza Virus 3 NOT DETECTED NOT DETECTED Final   Parainfluenza Virus 4 NOT DETECTED NOT DETECTED Final   Respiratory Syncytial Virus NOT DETECTED NOT DETECTED Final   Bordetella pertussis NOT DETECTED NOT DETECTED Final    Chlamydophila pneumoniae NOT DETECTED NOT DETECTED Final   Mycoplasma pneumoniae NOT DETECTED NOT DETECTED Final  Culture, blood (Routine X 2) w Reflex to ID Panel     Status: None (Preliminary result)   Collection Time: 01/23/16 10:06 AM  Result Value Ref Range Status   Specimen Description BLOOD RIGHT ASSIST CONTROL  Final   Special Requests BOTTLES DRAWN AEROBIC AND ANAEROBIC 5CC  Final   Culture NO GROWTH 2 DAYS  Final   Report Status PENDING  Incomplete  Culture, blood (Routine X 2) w Reflex to ID Panel     Status: None (Preliminary result)   Collection Time: 01/23/16 10:11 AM  Result Value Ref Range Status   Specimen Description BLOOD RIGHT ARM  Final   Special Requests BOTTLES DRAWN AEROBIC AND ANAEROBIC [place]  Final   Culture NO GROWTH 2 DAYS  Final   Report Status PENDING  Incomplete      Radiology Studies: Dg Chest 2 View  Result Date: 01/25/2016 CLINICAL DATA:  Dyspnea and leg swelling EXAM: CHEST  2 VIEW COMPARISON:  01/24/2016 and priors to 04/24/2015 FINDINGS: Stable cardiomegaly. Atherosclerosis of the visualized aorta. Platelike atelectasis bilaterally. Mild vascular congestion. No effusion or pneumothorax. No suspicious osseous lesions. IMPRESSION: Stable cardiomegaly with bibasilar atelectasis. Mild vascular congestion. Electronically Signed   By: Ashley Royalty M.D.   On: 01/25/2016 20:04     Scheduled Meds: . carvedilol  3.125 mg Oral BID WC  . [START ON 01/27/2016] cephALEXin  500 mg Oral Q12H  . clonazePAM  2 mg Oral BID  . colchicine  0.6 mg Oral BID  . diclofenac sodium  2 g Topical QID  . enoxaparin (LOVENOX) injection  40 mg Subcutaneous Q24H  . furosemide  40 mg Oral BID  . insulin aspart  0-5 Units Subcutaneous QHS  . insulin aspart  0-9 Units Subcutaneous TID WC  . lisinopril  2.5 mg Oral Daily  . mirtazapine  15 mg Oral QHS  . nicotine  14 mg Transdermal Daily  . pantoprazole  40 mg Oral Daily  . tiotropium  18 mcg Inhalation Daily   Continuous  Infusions:    LOS: 3 days   Time Spent in minutes   30 minutes  Chanele Douglas D.O. on 01/26/2016 at 2:38 PM  Between 7am to 7pm - Pager - [phone]  After 7pm go to [email or web address] - password [email or web address]  And look for the night coverage person covering for me after hours  [facility] Office  [phone]

## 2016-01-27 ENCOUNTER — Other Ambulatory Visit (HOSPITAL_COMMUNITY): Payer: Medicaid Other

## 2016-01-27 DIAGNOSIS — R0600 Dyspnea, unspecified: Secondary | ICD-10-CM

## 2016-01-27 LAB — BASIC METABOLIC PANEL
ANION GAP: 9 (ref 5–15)
BUN: 10 mg/dL (ref 6–20)
CALCIUM: 8.8 mg/dL — AB (ref 8.9–10.3)
CO2: 35 mmol/L — AB (ref 22–32)
Chloride: 95 mmol/L — ABNORMAL LOW (ref 101–111)
Creatinine, Ser: 0.46 mg/dL (ref 0.44–1.00)
GFR calc Af Amer: >60 mL/min (ref >60)
GLUCOSE: 97 mg/dL (ref 65–99)
Potassium: 3.9 mmol/L (ref 3.5–5.1)
Sodium: 139 mmol/L (ref 135–145)

## 2016-01-27 LAB — PROCALCITONIN: PROCALCITONIN: 1.99 ng/mL

## 2016-01-27 LAB — GLUCOSE, CAPILLARY: GLUCOSE-CAPILLARY: 93 mg/dL (ref 65–99)

## 2016-01-27 MED ORDER — CARVEDILOL 3.125 MG PO TABS: 3.1250 mg | ORAL_TABLET | Freq: Two times a day (BID) | ORAL | 0 refills | Status: DC

## 2016-01-27 MED ORDER — CLONAZEPAM 2 MG PO TABS: 2.0000 mg | ORAL_TABLET | Freq: Two times a day (BID) | ORAL | 0 refills | Status: DC

## 2016-01-27 MED ORDER — LISINOPRIL 2.5 MG PO TABS: 2.5000 mg | ORAL_TABLET | Freq: Every day | ORAL | 0 refills | Status: DC

## 2016-01-27 NOTE — Discharge Summary (Addendum)
Physician Discharge Summary  Michele Owens [id number] DOB: 02/08/1961 DOA: 01/23/2016  PCP: Michele Merino, MD  Admit date: 01/23/2016 Discharge date: 01/27/2016  Time spent: 45 minutes  Recommendations for Outpatient Follow-up:  Patient will be discharged to home with home health RN.  Patient will need to follow up with primary care provider within one week of discharge, discuss getting a thyroid biopsy.  Patient should continue medications as prescribed.  Patient should follow a heart healthy/carb modified diet.   Discharge Diagnoses:  Acute diastolic heart failure/pulmonary edema Leukocytosis ruled out sepsis Diabetes most, type II A central hypertension Anxiety Chronic pain Multinodular goiter  Discharge Condition: Stable  Diet recommendation: heart healthy/carb modified  Filed Weights   01/25/16 0628 01/26/16 0500 01/27/16 0620  Weight: (!) 141.2 kg (311 lb 4.8 oz) (!) 140.8 kg (310 lb 8 oz) (!) 138.7 kg (305 lb 11.2 oz)    History of present illness:  On 01/23/2016 by Ms. Michele Hearing, NP Michele Owens is a 55 y.o. female with medical history significant for  chronic respiratory failure secondary to asthmatic COPD, continued tobacco abuse, diabetes on oral agents, pulmonary hypertension with associated grade 2 diastolic dysfunction, hypertension, morbid obesity with associated sleep apnea and obesity hypoventilation syndrome, dyslipidemia, osteoarthritis and gout, anxiety disorder and normocytic anemia. Patient reports about 2-3 weeks ago she was having viral respiratory symptoms and went her primary care physician who prescribed her "green pills". She is unsure if these were antibiotics. She improved slightly so returned to her physician who gave her more of these pills. At initial presentation with symptoms she reported brown productive cough which has not changed. She has not changed the frequency level of her home oxygen therapy. She has utilized her normal nebulizers  and inhalers without improvement in symptoms. For the past 1 week she has noted increased lower extremity swelling which she attributes to not taking her gout pills. She states over the past 3 days her shortness of breath has worsened. EMS was called to the home were initially she was found to be wheezing audibly with bilateral crackles and O2 sats of 86% on room air. She was given albuterol neb with Atrovent, 125 mg of Solu-Medrol with improvement in her symptoms. She was able to speak in full sentences prior to transport.  Hospital Course:  Acute Diastolic heart failure/pulmonary edema -Echocardiogram August 2016 showed an EF of 0000000, grade 2 diastolic dysfunction -On [id number] Chest x-ray showed pulmonary vascular congestion -On 10/17, chest x-ray did show improvement -Patient was initially placed on IV Lasix, transitioned to PO -UOP over past 24hrs 2700cc -Patient refused repeat echocardiogram, tried to obtain 4 times  Leukocytosis, ruled out sepsis -Patient received methylprednisolone 125 mg on admission, the WBCs are trending downward -Culture show no growth to date -Currently afebrile -Patient was placed on antibiotics.  Completed course during hospitalization for possibly CAP.  Type 2 diabetes mellitus -Continue insulin sliding scale CBG monitoring  Essential hypertension -Continue lisinopril, Coreg -Transitioned to by mouth Lasix  Anxiety -Continue clonazepam  Chronic pain -Patient asking for refill on her prescription medications. Explained that she will need to follow-up with her primary care physician or prescribing physician to obtain refill.  Multinodular goiter -Patient did have ultrasound for thyroid. Not entirely sure what brought this on. -Patient will need to have biopsy done as an outpatient. -Called Dr. Leontine Locket office, also notifying them that patient will need outpatient follow up  Consultants None  Procedures  Thyroid US   Discharge Exam: Vitals:  01/26/16 2035 01/27/16 0620  BP: 133/71 (!) 142/73  Pulse: 96 95  Resp: 19 18  Temp: 98.3 F (36.8 C) 98.2 F (36.8 C)   Patient states she is feeling much better today. Denies shortness of breath, chest pain, abdominal pain, N/V, dizziness, headache.  Feels she is back to baseline and ready to go home.  Exam  General: Well developed, well nourished, NAD  HEENT: NCAT,mucous membranes moist.   Cardiovascular: S1 S2 auscultated, RRR, no murmurs  Respiratory: Clear to auscultation bilaterally with equal chest rise  Abdomen: Soft, obese, nontender, nondistended, + bowel sounds  Extremities: warm dry without cyanosis clubbing. +LE edema  Neuro: AAOx3, nonfocal  Psych: Normal affect and demeanor, pleasant  Discharge Instructions Discharge Instructions    Discharge instructions    Complete by:  As directed    Patient will be discharged to home with home health RN.  Patient will need to follow up with primary care provider within one week of discharge, discuss getting a thyroid biopsy.  Patient should continue medications as prescribed.  Patient should follow a heart healthy/carb modified diet.     Current Discharge Medication List    START taking these medications   Details  carvedilol (COREG) 3.125 MG tablet Take 1 tablet (3.125 mg total) by mouth 2 (two) times daily with a meal. Qty: 60 tablet, Refills: 0    lisinopril (PRINIVIL,ZESTRIL) 2.5 MG tablet Take 1 tablet (2.5 mg total) by mouth daily. Qty: 30 tablet, Refills: 0      CONTINUE these medications which have CHANGED   Details  clonazePAM (KLONOPIN) 2 MG tablet Take 1 tablet (2 mg total) by mouth 2 (two) times daily. Qty: 30 tablet, Refills: 0      CONTINUE these medications which have NOT CHANGED   Details  Acetaminophen-Codeine (TYLENOL/CODEINE #3) 300-30 MG tablet Take 1 tablet by mouth every 6 (six) hours as needed for pain.    albuterol (ACCUNEB) 1.25 MG/3ML nebulizer solution Take 1 ampule by  nebulization 2 (two) times daily.    colchicine 0.6 MG tablet Take 0.6 mg by mouth 2 (two) times daily.    furosemide (LASIX) 20 MG tablet Take 3 tablets (60 mg total) by mouth 2 (two) times daily. Qty: 30 tablet    loratadine (CLARITIN) 10 MG tablet Take 10 mg by mouth daily as needed for allergies.     metFORMIN (GLUCOPHAGE) 500 MG tablet Take 1 tablet (500 mg total) by mouth 2 (two) times daily with a meal. Qty: 30 tablet, Refills: 1    mirtazapine (REMERON) 15 MG tablet Take 15 mg by mouth at bedtime. Refills: 0    omeprazole (PRILOSEC) 40 MG capsule Take 40 mg by mouth daily. Refills: 2    potassium chloride (K-DUR,KLOR-CON) 10 MEQ tablet Take 10 mEq by mouth daily.    pravastatin (PRAVACHOL) 40 MG tablet Take 40 mg by mouth daily.  Refills: 2    tiotropium (SPIRIVA) 18 MCG inhalation capsule Place 18 mcg into inhaler and inhale daily.    acetaminophen (TYLENOL) 325 MG tablet Take 2 tablets (650 mg total) by mouth every 4 (four) hours as needed for headache or mild pain.    albuterol (PROVENTIL HFA;VENTOLIN HFA) 108 (90 BASE) MCG/ACT inhaler Inhale 1-2 puffs into the lungs every 6 (six) hours as needed for wheezing. Qty: 1 Inhaler, Refills: 0    diclofenac sodium (VOLTAREN) 1 % GEL Apply 2 g topically 4 (four) times daily as needed (for pain).     dicyclomine (BENTYL)  20 MG tablet Take 1 tablet (20 mg total) by mouth 2 (two) times daily as needed (abdominal pain/cramping). Qty: 20 tablet, Refills: 0    EPINEPHrine 0.3 mg/0.3 mL IJ SOAJ injection Inject 0.3 mg into the muscle daily as needed (allergic reaction).    HYDROcodone-acetaminophen (NORCO) 7.5-325 MG tablet take 1 tablet by mouth every 12 hours WHEN NECESSARY PAIN (MUST LAST 2 WEEKS); Pt received 28 pills total Refills: 0      STOP taking these medications     metoprolol succinate (TOPROL-XL) 50 MG 24 hr tablet      Lactobacillus (LACTINEX) PACK      albuterol (PROVENTIL) (2.5 MG/3ML) 0.083% nebulizer  solution      nicotine (NICODERM CQ - DOSED IN MG/24 HOURS) 14 mg/24hr patch        Allergies  Allergen Reactions  . Bee Venom Swelling and Other (See Comments)    "All over my body" (swelling)  . Ibuprofen Rash    Severe rash  . Lamisil [Terbinafine Hcl] Rash and Other (See Comments)    Pt states this causes her to "feel funny"  . Nsaids Other (See Comments)    Per MD's orders    Follow-up Information    [facility] .   Why:  They will send a home health care nurse to your home Contact information: [place] [address] [phone]        GARBA,LAWAL, MD. Schedule an appointment as soon as possible for a visit in 1 week(s).   Specialty:  Internal Medicine Why:  Hospital follow up Contact information: [address] [place] [address] [phone]            The results of significant diagnostics from this hospitalization (including imaging, microbiology, ancillary and laboratory) are listed below for reference.    Significant Diagnostic Studies: Dg Chest 2 View  Result Date: 01/25/2016 CLINICAL DATA:  Dyspnea and leg swelling EXAM: CHEST  2 VIEW COMPARISON:  01/24/2016 and priors to 04/24/2015 FINDINGS: Stable cardiomegaly. Atherosclerosis of the visualized aorta. Platelike atelectasis bilaterally. Mild vascular congestion. No effusion or pneumothorax. No suspicious osseous lesions. IMPRESSION: Stable cardiomegaly with bibasilar atelectasis. Mild vascular congestion. Electronically Signed   By: Ashley Royalty M.D.   On: 01/25/2016 20:04   Dg Chest 2 View  Result Date: 01/24/2016 CLINICAL DATA:  Shortness of breath, fever, chronic diastolic CHF, asthma, diabetes mellitus EXAM: CHEST  2 VIEW COMPARISON:  01/23/2016 radiographic and CT exams FINDINGS: Enlargement of cardiac silhouette with slight pulmonary vascular congestion. Persistent BILATERAL hilar prominence corresponding to enlarged hilar vascular structures on prior  CT. Mediastinal contours otherwise normal. Bibasilar atelectasis greater on LEFT. Improved pulmonary edema. Nondiagnostic lateral view. No gross pleural effusion or pneumothorax. Multilevel endplate spur formation thoracic spine. IMPRESSION: Enlargement of cardiac silhouette with pulmonary vascular congestion, including persistent prominent vascular structures at the pulmonary hila bilaterally. Improved pulmonary edema with persistent bibasilar atelectasis LEFT greater than RIGHT. Electronically Signed   By: Lavonia Dana M.D.   On: 01/24/2016 14:25   Dg Chest 2 View  Result Date: 01/23/2016 CLINICAL DATA:  55 y/o F; 3 days of shortness of breath with history of congestive heart failure and bilateral edema. EXAM: CHEST  2 VIEW COMPARISON:  08/09/2015 chest radiograph FINDINGS: Bulky opacification of the right hilum. Linear opacities at the left lung base. Diffusely prominent interstitial markings. Stable cardiac silhouette. No acute osseous abnormality is evident. IMPRESSION: Bulky opacification of right hilum may represent pneumonia or atypical  edema but underlying lymphadenopathy/mass is also possible. CT of the chest is recommended for further characterization. These results were called by telephone at the time of interpretation on 01/23/2016 at 4:37 am to Dr. Veryl Speak , who verbally acknowledged these results. Electronically Signed   By: Kristine Garbe M.D.   On: 01/23/2016 04:37   Ct Chest W Contrast  Result Date: 01/23/2016 CLINICAL DATA:  Abnormal chest x-ray. Dizziness, headaches, chest pain, and cough over the last week. EXAM: CT CHEST WITH CONTRAST TECHNIQUE: Multidetector CT imaging of the chest was performed during intravenous contrast administration. CONTRAST:  43mL ISOVUE-300 IOPAMIDOL (ISOVUE-300) INJECTION 61% COMPARISON:  One-view chest x-ray in 01/23/2016. One-view chest x-ray 08/09/2015. FINDINGS: Cardiovascular: Heart is mildly enlarged. There is prominence the pulmonary  arteries and veins bilaterally. Mediastinum/Nodes: Sub cm mediastinal nodes are present. There is no significant mediastinal adenopathy. A prominent right axillary node appears benign. Multinodular thyroid goiter extends into the superior mediastinum. A dominant 3.4 cm nodule is present on the right. Incidental note is made of a lipoma within the left pectoralis muscle measuring at least 3.1 cm. There is no significant pericardial effusion. Lungs/Pleura: Bilateral linear densities likely reflect atelectasis. There is mild interstitial edema as well. No significant airspace consolidation is present. There is no nodule or mass. No significant pleural effusion is present. Upper Abdomen: Limited imaging of the upper abdomen is unremarkable. Musculoskeletal: There is fusion of anterior osteophytes in the mid and lower thoracic spine. No focal lytic or blastic lesions are present. IMPRESSION: 1. Enlarged pulmonary arteries and veins bilaterally most compatible with pulmonary vascular congestion. Recommend follow-up chest x-ray to assure resolution of findings. If the chest x-ray does not resolve, follow-up CT the chest with contrast recommended in 1 months time to exclude any underlying adenopathy or mass lesion obscured by the congestion. 2. Bilateral areas of linear atelectasis and interstitial edema compatible with congestive heart failure. 3. Multinodular thyroid goiter. The dominant 3 cm nodule is present within the right lobe of the thyroid Recommend further evaluation with thyroid ultrasound. If patient is clinically hyperthyroid, consider nuclear medicine thyroid uptake and scan. Electronically Signed   By: San Morelle M.D.   On: 01/23/2016 07:40   US Thyroid  Result Date: 01/24/2016 CLINICAL DATA:  Incidental on CT.  Thyroid nodules on chest CT. EXAM: THYROID ULTRASOUND TECHNIQUE: Ultrasound examination of the thyroid gland and adjacent soft tissues was performed. COMPARISON:  None. FINDINGS:  Parenchymal Echotexture: Moderately heterogenous Estimated total number of nodules >/= 1 cm: 4 Number of spongiform nodules >/=  2 cm not described below (TR1): 0 Number of mixed cystic and solid nodules >/= 1.5 cm not described below (TR2): 0 _________________________________________________________ Isthmus: Measures 1.1 cm. No discrete nodules are identified within the thyroid isthmus. _________________________________________________________ Right lobe: Measures 5.8 x 3.5 x 3.7 cm. Nodule # 1: Location: Right; Mid Size: Measures 3.9 x 2.8 x 3.5 cm. Composition: solid/almost completely solid (2) Echogenicity: hypoechoic (2) Shape: not taller-than-wide (0) Margins: smooth (0) Echogenic foci: none (0) ACR TI-RADS total points: 4. ACR TI-RADS risk category: TR4 (4-6 points). ACR TI-RADS recommendations: **Given size (>/= 1.5 cm) and appearance, fine needle aspiration of this moderately suspicious nodule should be considered based on TI-RADS criteria. Nodule # 2: Location: Right; Inferior Size: 1.3 x 0.7 x 1.3 cm. Composition: solid/almost completely solid (2) Echogenicity: hypoechoic (2) Shape: not taller-than-wide (0) Margins: ill-defined (0) Echogenic foci: none (0) ACR TI-RADS total points: 4. ACR TI-RADS risk category: TR4 (4-6 points). ACR TI-RADS recommendations: *  Given size (>/= 1 - 1.4 cm) and appearance, a follow-up ultrasound in 1 year should be considered based on TI-RADS criteria. Nodule # 3: Location: Right; Superior Size: Measures 1.5 x 1.4 x 1.1 cm. Composition: solid/almost completely solid (2) Echogenicity: hypoechoic (2) Shape: not taller-than-wide (0) Margins: ill-defined (0) Echogenic foci: punctate echogenic foci (3) and some peripheral calcifications. ACR TI-RADS total points: 7. ACR TI-RADS risk category: TR5 (>/= 7 points). ACR TI-RADS recommendations: **Given size (>/= 1.0 cm) and appearance, fine needle aspiration of this highly suspicious nodule should be considered based on TI-RADS  criteria. _________________________________________________________ Left lobe: Measures 3.6 x 3.5 x 2.0 cm. Nodule # 4: Location: Left; Mid Size: Measures 2.0 x 1.7 x 1.6 cm. Composition: solid/almost completely solid (2) Echogenicity: isoechoic (1) Shape: not taller-than-wide (0) Margins: ill-defined (0) Echogenic foci: none (0) ACR TI-RADS total points: 3. ACR TI-RADS risk category: TR3 (3 points). ACR TI-RADS recommendations: *Given size (>/= 1.5 - 2.4 cm) and appearance, a follow-up ultrasound in 1 year should be considered based on TI-RADS criteria. IMPRESSION: Multi nodular goiter. Nodules #1 and #3 in the right thyroid lobe both meet criteria for biopsy. Nodule #2 in the right thyroid lobe and Nodule #4 in left thyroid lobe both meet criteria for 1 year follow-up. The above is in keeping with the ACR TI-RADS recommendations - J Am Coll Radiol 2017;14:587-595. Electronically Signed   By: Markus Daft M.D.   On: 01/24/2016 14:16    Microbiology: Recent Results (from the past 240 hour(s))  Respiratory Panel by PCR     Status: None   Collection Time: 01/23/16  7:47 AM  Result Value Ref Range Status   Adenovirus NOT DETECTED NOT DETECTED Final   Coronavirus 229E NOT DETECTED NOT DETECTED Final   Coronavirus HKU1 NOT DETECTED NOT DETECTED Final   Coronavirus NL63 NOT DETECTED NOT DETECTED Final   Coronavirus OC43 NOT DETECTED NOT DETECTED Final   Metapneumovirus NOT DETECTED NOT DETECTED Final   Rhinovirus / Enterovirus NOT DETECTED NOT DETECTED Final   Influenza A NOT DETECTED NOT DETECTED Final   Influenza B NOT DETECTED NOT DETECTED Final   Parainfluenza Virus 1 NOT DETECTED NOT DETECTED Final   Parainfluenza Virus 2 NOT DETECTED NOT DETECTED Final   Parainfluenza Virus 3 NOT DETECTED NOT DETECTED Final   Parainfluenza Virus 4 NOT DETECTED NOT DETECTED Final   Respiratory Syncytial Virus NOT DETECTED NOT DETECTED Final   Bordetella pertussis NOT DETECTED NOT DETECTED Final   Chlamydophila  pneumoniae NOT DETECTED NOT DETECTED Final   Mycoplasma pneumoniae NOT DETECTED NOT DETECTED Final  Culture, blood (Routine X 2) w Reflex to ID Panel     Status: None (Preliminary result)   Collection Time: 01/23/16 10:06 AM  Result Value Ref Range Status   Specimen Description BLOOD RIGHT ASSIST CONTROL  Final   Special Requests BOTTLES DRAWN AEROBIC AND ANAEROBIC 5CC  Final   Culture NO GROWTH 3 DAYS  Final   Report Status PENDING  Incomplete  Culture, blood (Routine X 2) w Reflex to ID Panel     Status: None (Preliminary result)   Collection Time: 01/23/16 10:11 AM  Result Value Ref Range Status   Specimen Description BLOOD RIGHT ARM  Final   Special Requests BOTTLES DRAWN AEROBIC AND ANAEROBIC [place]  Final   Culture NO GROWTH 3 DAYS  Final   Report Status PENDING  Incomplete     Labs: Basic Metabolic Panel:  Recent Labs Lab 01/23/16 0456 01/24/16 0421 01/25/16 0805  01/26/16 0553 01/27/16 0434  NA 143 140 142 140 139  K 3.9 4.5 4.2 3.7 3.9  CL 100* 102 98* 95* 95*  CO2 34* 30 36* 36* 35*  GLUCOSE 139* 145* 95 106* 97  BUN 9 9 11 13 10   CREATININE 0.60 0.42* 0.52 0.55 0.46  CALCIUM 9.0 8.8* 9.1 8.8* 8.8*   Liver Function Tests:  Recent Labs Lab 01/23/16 0456  AST 17  ALT 18  ALKPHOS 83  BILITOT 0.6  PROT 6.9  ALBUMIN 3.6   No results for input(s): LIPASE, AMYLASE in the last 168 hours. No results for input(s): AMMONIA in the last 168 hours. CBC:  Recent Labs Lab 01/23/16 0456 01/24/16 0421 01/25/16 0805 01/26/16 0553  WBC 22.3* 27.8* 22.8* 14.5*  NEUTROABS 18.5* 25.0* 18.2* 10.3*  HGB 10.0* 9.3* 9.8* 10.3*  HCT 34.8* 33.4* 34.8* 35.9*  MCV 85.9 87.7 87.7 86.1  PLT 315 343 346 321   Cardiac Enzymes: No results for input(s): CKTOTAL, CKMB, CKMBINDEX, TROPONINI in the last 168 hours. BNP: BNP (last 3 results)  Recent Labs  05/25/15 1202 08/09/15 1734 01/23/16 0456  BNP 6.5 6.6 63.3    ProBNP (last 3 results) No results for input(s): PROBNP  in the last 8760 hours.  CBG:  Recent Labs Lab 01/26/16 0614 01/26/16 1145 01/26/16 1640 01/26/16 2109 01/27/16 0655  GLUCAP 110* 126* 130* 159* 93       Signed:  Larken Urias  [facility] 01/27/2016, 9:43 AM

## 2016-01-27 NOTE — Progress Notes (Signed)
  Talked to RN Romelle Starcher was told patient was still refusing echo. This is the 4th attempt we have made to get echo completed.   Michele Owens 01/27/2016, 7:50 AM

## 2016-01-27 NOTE — Progress Notes (Signed)
Pt has orders to be discharged. Discharge instructions given and pt has no additional questions at this time. Medication regimen reviewed and pt educated. Pt verbalized understanding and has no additional questions. Pt stable and waiting for transportation.   Maurene Capes RN

## 2016-01-27 NOTE — Discharge Instructions (Signed)
Heart Failure  Heart failure means your heart has trouble pumping blood. This makes it hard for your body to work well. Heart failure is usually a long-term (chronic) condition. You must take good care of yourself and follow your doctor's treatment plan.  HOME CARE   Take your heart medicine as told by your doctor.    Do not stop taking medicine unless your doctor tells you to.    Do not skip any dose of medicine.    Refill your medicines before they run out.    Take other medicines only as told by your doctor or pharmacist.   Stay active if told by your doctor. The elderly and people with severe heart failure should talk with a doctor about physical activity.   Eat heart-healthy foods. Choose foods that are without trans fat and are low in saturated fat, cholesterol, and salt (sodium). This includes fresh or frozen fruits and vegetables, fish, lean meats, fat-free or low-fat dairy foods, whole grains, and high-fiber foods. Lentils and dried peas and beans (legumes) are also good choices.   Limit salt if told by your doctor.   Cook in a healthy way. Roast, grill, broil, bake, poach, steam, or stir-fry foods.   Limit fluids as told by your doctor.   Weigh yourself every morning. Do this after you pee (urinate) and before you eat breakfast. Write down your weight to give to your doctor.   Take your blood pressure and write it down if your doctor tells you to.   Ask your doctor how to check your pulse. Check your pulse as told.   Lose weight if told by your doctor.   Stop smoking or chewing tobacco. Do not use gum or patches that help you quit without your doctor's approval.   Schedule and go to doctor visits as told.   Nonpregnant women should have no more than 1 drink a day. Men should have no more than 2 drinks a day. Talk to your doctor about drinking alcohol.   Stop illegal drug use.   Stay current with shots (immunizations).   Manage your health conditions as told by your doctor.   Learn to  manage your stress.   Rest when you are tired.   If it is really hot outside:    Avoid intense activities.    Use air conditioning or fans, or get in a cooler place.    Avoid caffeine and alcohol.    Wear loose-fitting, lightweight, and light-colored clothing.   If it is really cold outside:    Avoid intense activities.    Layer your clothing.    Wear mittens or gloves, a hat, and a scarf when going outside.    Avoid alcohol.   Learn about heart failure and get support as needed.   Get help to maintain or improve your quality of life and your ability to care for yourself as needed.  GET HELP IF:    You gain weight quickly.   You are more short of breath than usual.   You cannot do your normal activities.   You tire easily.   You cough more than normal, especially with activity.   You have any or more puffiness (swelling) in areas such as your hands, feet, ankles, or belly (abdomen).   You cannot sleep because it is hard to breathe.   You feel like your heart is beating fast (palpitations).   You get dizzy or light-headed when you stand up.  GET HELP   RIGHT AWAY IF:    You have trouble breathing.   There is a change in mental status, such as becoming less alert or not being able to focus.   You have chest pain or discomfort.   You faint.  MAKE SURE YOU:    Understand these instructions.   Will watch your condition.   Will get help right away if you are not doing well or get worse.     This information is not intended to replace advice given to you by your health care provider. Make sure you discuss any questions you have with your health care provider.     Document Released: 01/04/2008 Document Revised: 04/17/2014 Document Reviewed: 05/13/2012  Elsevier Interactive Patient Education 2016 [employer]

## 2016-01-28 LAB — CULTURE, BLOOD (ROUTINE X 2)
CULTURE: NO GROWTH
Culture: NO GROWTH

## 2016-02-07 ENCOUNTER — Telehealth (HOSPITAL_COMMUNITY): Payer: Self-pay | Admitting: Vascular Surgery

## 2016-02-07 NOTE — Telephone Encounter (Signed)
Pt called back for directions, provided appt date/time and directions to our office.

## 2016-02-07 NOTE — Telephone Encounter (Signed)
Returned pt call /pt needed [facility] clinic address, left this info on pt VM

## 2016-02-10 ENCOUNTER — Ambulatory Visit (HOSPITAL_COMMUNITY)
Admission: RE | Admit: 2016-02-10 | Discharge: 2016-02-10 | Disposition: A | Discharge status: Home / Self Care | Payer: Medicaid Other | Source: Ambulatory Visit | Attending: Cardiology | Admitting: Cardiology

## 2016-02-10 VITALS — BP 138/80 | HR 104 | Wt 287.8 lb

## 2016-02-10 DIAGNOSIS — Z833 Family history of diabetes mellitus: Secondary | ICD-10-CM | POA: Diagnosis not present

## 2016-02-10 DIAGNOSIS — I10 Essential (primary) hypertension: Secondary | ICD-10-CM

## 2016-02-10 DIAGNOSIS — I5042 Chronic combined systolic (congestive) and diastolic (congestive) heart failure: Secondary | ICD-10-CM | POA: Insufficient documentation

## 2016-02-10 DIAGNOSIS — I5032 Chronic diastolic (congestive) heart failure: Secondary | ICD-10-CM | POA: Diagnosis not present

## 2016-02-10 DIAGNOSIS — E1169 Type 2 diabetes mellitus with other specified complication: Secondary | ICD-10-CM

## 2016-02-10 DIAGNOSIS — Z87891 Personal history of nicotine dependence: Secondary | ICD-10-CM | POA: Insufficient documentation

## 2016-02-10 DIAGNOSIS — Z79899 Other long term (current) drug therapy: Secondary | ICD-10-CM | POA: Diagnosis not present

## 2016-02-10 DIAGNOSIS — E785 Hyperlipidemia, unspecified: Secondary | ICD-10-CM | POA: Diagnosis not present

## 2016-02-10 DIAGNOSIS — G4733 Obstructive sleep apnea (adult) (pediatric): Secondary | ICD-10-CM | POA: Diagnosis not present

## 2016-02-10 DIAGNOSIS — Z7984 Long term (current) use of oral hypoglycemic drugs: Secondary | ICD-10-CM | POA: Insufficient documentation

## 2016-02-10 DIAGNOSIS — F329 Major depressive disorder, single episode, unspecified: Secondary | ICD-10-CM | POA: Diagnosis not present

## 2016-02-10 DIAGNOSIS — R079 Chest pain, unspecified: Secondary | ICD-10-CM | POA: Insufficient documentation

## 2016-02-10 DIAGNOSIS — E669 Obesity, unspecified: Secondary | ICD-10-CM | POA: Diagnosis not present

## 2016-02-10 DIAGNOSIS — J449 Chronic obstructive pulmonary disease, unspecified: Secondary | ICD-10-CM | POA: Insufficient documentation

## 2016-02-10 DIAGNOSIS — Z809 Family history of malignant neoplasm, unspecified: Secondary | ICD-10-CM | POA: Diagnosis not present

## 2016-02-10 DIAGNOSIS — K219 Gastro-esophageal reflux disease without esophagitis: Secondary | ICD-10-CM | POA: Diagnosis not present

## 2016-02-10 DIAGNOSIS — I110 Hypertensive heart disease with heart failure: Secondary | ICD-10-CM | POA: Insufficient documentation

## 2016-02-10 DIAGNOSIS — I471 Supraventricular tachycardia: Secondary | ICD-10-CM | POA: Insufficient documentation

## 2016-02-10 DIAGNOSIS — M109 Gout, unspecified: Secondary | ICD-10-CM | POA: Insufficient documentation

## 2016-02-10 DIAGNOSIS — Z9981 Dependence on supplemental oxygen: Secondary | ICD-10-CM | POA: Diagnosis not present

## 2016-02-10 DIAGNOSIS — E119 Type 2 diabetes mellitus without complications: Secondary | ICD-10-CM | POA: Insufficient documentation

## 2016-02-10 LAB — BASIC METABOLIC PANEL
Anion gap: 8 (ref 5–15)
BUN: 9 mg/dL (ref 6–20)
CHLORIDE: 101 mmol/L (ref 101–111)
CO2: 31 mmol/L (ref 22–32)
CREATININE: 0.56 mg/dL (ref 0.44–1.00)
Calcium: 9.2 mg/dL (ref 8.9–10.3)
GFR calc non Af Amer: >60 mL/min (ref >60)
GLUCOSE: 94 mg/dL (ref 65–99)
Potassium: 3.9 mmol/L (ref 3.5–5.1)
Sodium: 140 mmol/L (ref 135–145)

## 2016-02-10 MED ORDER — CARVEDILOL 6.25 MG PO TABS: 6.2500 mg | ORAL_TABLET | Freq: Two times a day (BID) | ORAL | 6 refills | Status: DC

## 2016-02-10 NOTE — Patient Instructions (Signed)
Routine lab work today. Will notify you of abnormal results, otherwise no news is good news!  INCREASE Carvedilol (Coreg) to 6.25 mg twice daily. Can "double up" on your current 3.125 mg tabs (Take 2 tabs twice daily). New Rx will be a 6.25 mg tablet (Take 1 tab twice daily).  Follow up with Dr. Aundra Dubin in 3 months.  Do the following things EVERYDAY: 1) Weigh yourself in the morning before breakfast. Write it down and keep it in a log. 2) Take your medicines as prescribed 3) Eat low salt foods-Limit salt (sodium) to 2000 mg per day.  4) Stay as active as you can everyday 5) Limit all fluids for the day to less than 2 liters

## 2016-02-10 NOTE — Progress Notes (Signed)
Patient ID: Michele Owens, female   DOB: Aug 15, 1960, 55 y.o.   MRN: [id number]     Advanced Heart Failure Clinic Note   PCP: Dr. Jonelle Sidle HF: Dr. Aundra Dubin   55 yo with history of HTN, obesity, OSA, COPD, hyperlipidemia, and SVT presents for cardiology evaluation.  She has now quit smoking and is using a nicotine patch.  She was admitted in 99991111 with diastolic CHF and COPD exacerbation.  She was admitted in 1/17 with COPD exacerbation.  She was set up for a Cardiolite in 2/17.  This showed a small, moderate intensity partially reversible inferolateral defect.  Last echo was in 8/16 and showed normal EF with moderate diastolic dysfunction.  LHC/RHC in 3/17 showed no significant CAD, mildly elevated PCWP.  PFTs in 3/17 showed severe COPD.    Admitted 10/15-10/19/17 with ACOPDE and A/C CHF.  Diuresed with IV lasix. Completed course for CAP. Pt also noted have multinodular goiter on [place].  Endocrine follow up arranged.   Patient presents today for post hospital follow up.  She is up 9 lbs from last visit, but that was in 08/2015. Weight is down 18 lbs from discharge from the hospital. Though question accuracy.  She states she is up 4 lbs by her home scale. Weight at home 290 lbs on 02/08/16. Eats a lot of fried food. She is eating a cup of ice in the clinic. Smoking 5 cigarettes a day, trying to stop by will-power. Using sugar free gum.  She is on 2 L 02 chronically for COPD.  Very limited from joint pain with gout and arthritis.  No SOB on flat ground if wearing 02.     Labs (11/12): K 3.6, creatinine 0.73 Labs (12/13): K 3.9, creatinine 0.9, BNP 22 Labs (11/14): K 3.9, creatinine 0.5 Labs (2/17): K 4.2, creatinine 0.62, HCT 37.4 Labs (5/17): K 3.7, creatinine 0.7, BNP 6.6  PMH: 1. Back and hip pain 2. HTN 3. Obesity 4. GERD 5. Hyperlipidemia 6. Depression 7. COPD: Has quit smoking. Now on home oxygen but does not use all the time.  PFTs (3/17) with severe COPD.  8. Asthma 9. 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 Event monitor (12/14) with only NSR noted.  10. Chest pain: Atypical.  ETT-Cardiolite (12/13) with EF 60%, no ischemia/infarction. Lexiscan Cardiolite (2/17) with small, moderate-intensity partially reversible inferolateral defect. LHC (3/17): No significant CAD. 11. Syncope: Uncertain etiology.   12. OSA: Cannot tolerate CPAP. Sleep study negative for OSA in 5/17.  13. Gout 14. Migraines 15. Diabetes 16. Diastolic CHF: Echo (99991111) with EF 55-60%, grade II diastolic dysfunction.  - RHC (3/17) with mean RA 8, PA 45/20 mean 30, mean PCWP 20; PVR 1.5 WU.   SH: Former smoker.  Lives alone.  Does not use drugs or ETOH.   FH: Father with cancer, mother with diabetes, brother with fen-phen related valvulopathy  ROS: All systems reviewed and negative except as per HPI.   Current Outpatient Prescriptions  Medication Sig Dispense Refill  . acetaminophen (TYLENOL) 325 MG tablet Take 2 tablets (650 mg total) by mouth every 4 (four) hours as needed for headache or mild pain.    . Acetaminophen-Codeine (TYLENOL/CODEINE #3) 300-30 MG tablet Take 1 tablet by mouth every 6 (six) hours as needed for pain.    Marland Kitchen albuterol (ACCUNEB) 1.25 MG/3ML nebulizer solution Take 1 ampule by nebulization 2 (two) times daily.    Marland Kitchen albuterol (PROVENTIL HFA;VENTOLIN HFA) 108 (90 BASE) MCG/ACT inhaler Inhale  1-2 puffs into the lungs every 6 (six) hours as needed for wheezing. 1 Inhaler 0  . carvedilol (COREG) 6.25 MG tablet Take 1 tablet (6.25 mg total) by mouth 2 (two) times daily with a meal. 60 tablet 6  . clonazePAM (KLONOPIN) 2 MG tablet Take 1 tablet (2 mg total) by mouth 2 (two) times daily. 30 tablet 0  . colchicine 0.6 MG tablet Take 0.6 mg by mouth 2 (two) times daily.    . diclofenac sodium (VOLTAREN) 1 % GEL Apply 2 g topically 4 (four) times daily as needed (for pain).     Marland Kitchen dicyclomine (BENTYL) 20 MG tablet Take 1 tablet (20 mg total) by mouth 2 (two) times daily as  needed (abdominal pain/cramping). 20 tablet 0  . EPINEPHrine 0.3 mg/0.3 mL IJ SOAJ injection Inject 0.3 mg into the muscle daily as needed (allergic reaction).    . furosemide (LASIX) 40 MG tablet Take 80 mg by mouth 2 (two) times daily.    Marland Kitchen HYDROcodone-acetaminophen (NORCO) 7.5-325 MG tablet take 1 tablet by mouth every 12 hours WHEN NECESSARY PAIN (MUST LAST 2 WEEKS); Pt received 28 pills total  0  . lisinopril (PRINIVIL,ZESTRIL) 2.5 MG tablet Take 1 tablet (2.5 mg total) by mouth daily. 30 tablet 0  . loratadine (CLARITIN) 10 MG tablet Take 10 mg by mouth daily as needed for allergies.     . metFORMIN (GLUCOPHAGE) 500 MG tablet Take 1 tablet (500 mg total) by mouth 2 (two) times daily with a meal. 30 tablet 1  . mirtazapine (REMERON) 15 MG tablet Take 15 mg by mouth at bedtime.  0  . omeprazole (PRILOSEC) 40 MG capsule Take 40 mg by mouth daily.  2  . potassium chloride (K-DUR,KLOR-CON) 10 MEQ tablet Take 10 mEq by mouth daily.    . pravastatin (PRAVACHOL) 40 MG tablet Take 40 mg by mouth daily.   2  . tiotropium (SPIRIVA) 18 MCG inhalation capsule Place 18 mcg into inhaler and inhale daily.     No current facility-administered medications for this encounter.     BP 138/80 (BP Location: Left Arm, Patient Position: Sitting, Cuff Size: Large)   Pulse (!) 104   Wt 287 lb 12.8 oz (130.5 kg)   SpO2 94%   BMI 47.89 kg/m    Wt Readings from Last 3 Encounters:  02/10/16 287 lb 12.8 oz (130.5 kg)  01/27/16 (!) 305 lb 11.2 oz (138.7 kg)  12/15/15 287 lb (130.2 kg)    General: NAD, obese.  Neck: No JVD, no thyromegaly or thyroid nodule.  Lungs: Decreased BS diffusely bilaterally.  CV: Nondisplaced PMI.  Heart regular S1/S2, no S3/S4, 1/6 SEM RUSB.  Trace ankle edema.  No carotid bruit.  Normal pedal pulses.  Abdomen: Soft, nontender, no hepatosplenomegaly, no distention.  Skin: Intact without lesions or rashes.  Neurologic: Alert and oriented x 3.  Psych: Normal affect.  Extremities: No  clubbing or cyanosis.   Assessment/Plan: 1. Chest pain: No significant CAD on 3/17 cath.   - She has not had any further CP.  2. Chronic diastolic CHF: Echo (99991111) with EF 55-60%, moderate diastolic dysfunction.   - NYHA II symptoms. Volume status stable.  - Continue Lasix 80 mg BID.  - She has refused repeat Echo 4 times in the hospital.  3. OSA:  - Sleep study 08/2015 did not show significant OSA.    4. COPD: - She continues to smoke. States she is trying to stop.  - Severe obstruction by  PFTs.  Continue home 02 at 2 L.  5. HTN - Mildly elevated.  - Increase coreg to 6.25 mg BID with mild tachycardia on arrival and mildly elevated BP - Continue lisinopril 2.5 mg daily. This is a new med. BMET today.    Stable from HF standpoint.  Increasing coreg for BP. Will forward note to her PCP.   Follow up with Dr. Aundra Dubin in 3 months.   Shirley Friar, PA-C 04/19/2014  Total time spent > 25 minutes. Over half that spent discussing the above.

## 2016-02-22 ENCOUNTER — Other Ambulatory Visit (HOSPITAL_COMMUNITY): Payer: Self-pay | Admitting: Student

## 2016-03-28 ENCOUNTER — Other Ambulatory Visit: Payer: Self-pay | Admitting: Surgery

## 2016-03-28 DIAGNOSIS — E041 Nontoxic single thyroid nodule: Secondary | ICD-10-CM

## 2016-04-13 ENCOUNTER — Ambulatory Visit
Admission: RE | Admit: 2016-04-13 | Discharge: 2016-04-13 | Disposition: A | Discharge status: Home / Self Care | Payer: Medicaid Other | Source: Ambulatory Visit | Attending: Surgery | Admitting: Surgery

## 2016-04-13 ENCOUNTER — Other Ambulatory Visit (HOSPITAL_COMMUNITY)
Admission: RE | Admit: 2016-04-13 | Discharge: 2016-04-13 | Disposition: A | Discharge status: Home / Self Care | Payer: Medicaid Other | Source: Ambulatory Visit | Attending: Radiology | Admitting: Radiology

## 2016-04-13 DIAGNOSIS — E041 Nontoxic single thyroid nodule: Secondary | ICD-10-CM

## 2016-04-13 DIAGNOSIS — E042 Nontoxic multinodular goiter: Secondary | ICD-10-CM | POA: Insufficient documentation

## 2016-04-27 ENCOUNTER — Ambulatory Visit: Payer: Medicaid Other | Admitting: Podiatry

## 2016-04-28 ENCOUNTER — Encounter (HOSPITAL_COMMUNITY): Payer: Self-pay

## 2016-05-16 ENCOUNTER — Encounter: Payer: Self-pay | Admitting: Podiatry

## 2016-05-16 ENCOUNTER — Ambulatory Visit (INDEPENDENT_AMBULATORY_CARE_PROVIDER_SITE_OTHER): Payer: Medicaid Other | Admitting: Podiatry

## 2016-05-16 ENCOUNTER — Ambulatory Visit (INDEPENDENT_AMBULATORY_CARE_PROVIDER_SITE_OTHER): Payer: Medicaid Other

## 2016-05-16 VITALS — BP 106/57 | HR 91 | Resp 16

## 2016-05-16 DIAGNOSIS — S99921A Unspecified injury of right foot, initial encounter: Secondary | ICD-10-CM

## 2016-05-16 DIAGNOSIS — M722 Plantar fascial fibromatosis: Secondary | ICD-10-CM

## 2016-05-16 DIAGNOSIS — M79676 Pain in unspecified toe(s): Secondary | ICD-10-CM | POA: Diagnosis not present

## 2016-05-16 DIAGNOSIS — B351 Tinea unguium: Secondary | ICD-10-CM

## 2016-05-16 DIAGNOSIS — M79672 Pain in left foot: Secondary | ICD-10-CM

## 2016-05-17 NOTE — Progress Notes (Signed)
She presents today primary complaint of painful elongated toenails and plantar fasciitis bilateral. She states that I'll spell time I think he needs more injections.  Objective: Vital signs are stable alert and oriented 3. Pulses are palpable. She has pain on palpation medial calcaneal tubercles bilateral her toenails are long thick yellow dystrophic and clinically mycotic.  Radiographs taken today to evaluate swelling to the dorsum of the feet does not demonstrate any type of significant changes on previous evaluations other than increase in osteoarthritic process in the dorsal aspect of the foot along Lisfranc's joint left primarily.  Assessment: Osteoarthritis left foot. Plantar fasciitis bilateral. Pain in limb secondary to onychomycosis.  Plan: Debridement of toenails 1 through 5 bilateral. Injected the bilateral heels today with Kenalog and local anesthetic. Discussed appropriate shoe gear. Follow up with her as needed. I recommended that she continue the use of the diclofenac gel

## 2016-06-14 ENCOUNTER — Other Ambulatory Visit: Payer: Self-pay | Admitting: Surgery

## 2016-06-14 DIAGNOSIS — E042 Nontoxic multinodular goiter: Secondary | ICD-10-CM

## 2016-06-20 ENCOUNTER — Other Ambulatory Visit (HOSPITAL_COMMUNITY)
Admission: RE | Admit: 2016-06-20 | Discharge: 2016-06-20 | Disposition: A | Discharge status: Home / Self Care | Payer: Medicaid Other | Source: Ambulatory Visit | Attending: Physician Assistant | Admitting: Physician Assistant

## 2016-06-20 ENCOUNTER — Ambulatory Visit
Admission: RE | Admit: 2016-06-20 | Discharge: 2016-06-20 | Disposition: A | Discharge status: Home / Self Care | Payer: Medicaid Other | Source: Ambulatory Visit | Attending: Surgery | Admitting: Surgery

## 2016-06-20 DIAGNOSIS — E041 Nontoxic single thyroid nodule: Secondary | ICD-10-CM | POA: Diagnosis present

## 2016-06-20 DIAGNOSIS — E042 Nontoxic multinodular goiter: Secondary | ICD-10-CM

## 2016-07-16 ENCOUNTER — Inpatient Hospital Stay (HOSPITAL_COMMUNITY)
Admission: EM | Admit: 2016-07-16 | Discharge: 2016-07-24 | DRG: 208 | Disposition: A | Discharge status: Home / Self Care | Payer: Medicaid Other | Attending: Internal Medicine | Admitting: Internal Medicine

## 2016-07-16 ENCOUNTER — Encounter (HOSPITAL_COMMUNITY): Payer: Self-pay | Admitting: Emergency Medicine

## 2016-07-16 ENCOUNTER — Emergency Department (HOSPITAL_COMMUNITY): Payer: Medicaid Other

## 2016-07-16 DIAGNOSIS — E874 Mixed disorder of acid-base balance: Secondary | ICD-10-CM | POA: Diagnosis present

## 2016-07-16 DIAGNOSIS — J9621 Acute and chronic respiratory failure with hypoxia: Secondary | ICD-10-CM

## 2016-07-16 DIAGNOSIS — Z6841 Body Mass Index (BMI) 40.0 and over, adult: Secondary | ICD-10-CM | POA: Diagnosis not present

## 2016-07-16 DIAGNOSIS — K219 Gastro-esophageal reflux disease without esophagitis: Secondary | ICD-10-CM | POA: Diagnosis present

## 2016-07-16 DIAGNOSIS — R0602 Shortness of breath: Secondary | ICD-10-CM | POA: Diagnosis present

## 2016-07-16 DIAGNOSIS — Z79899 Other long term (current) drug therapy: Secondary | ICD-10-CM

## 2016-07-16 DIAGNOSIS — Z9981 Dependence on supplemental oxygen: Secondary | ICD-10-CM

## 2016-07-16 DIAGNOSIS — Z4659 Encounter for fitting and adjustment of other gastrointestinal appliance and device: Secondary | ICD-10-CM

## 2016-07-16 DIAGNOSIS — D72829 Elevated white blood cell count, unspecified: Secondary | ICD-10-CM | POA: Diagnosis not present

## 2016-07-16 DIAGNOSIS — Y92230 Patient room in hospital as the place of occurrence of the external cause: Secondary | ICD-10-CM | POA: Diagnosis not present

## 2016-07-16 DIAGNOSIS — D649 Anemia, unspecified: Secondary | ICD-10-CM | POA: Diagnosis present

## 2016-07-16 DIAGNOSIS — E785 Hyperlipidemia, unspecified: Secondary | ICD-10-CM | POA: Diagnosis present

## 2016-07-16 DIAGNOSIS — I5043 Acute on chronic combined systolic (congestive) and diastolic (congestive) heart failure: Secondary | ICD-10-CM | POA: Diagnosis present

## 2016-07-16 DIAGNOSIS — Z90710 Acquired absence of both cervix and uterus: Secondary | ICD-10-CM

## 2016-07-16 DIAGNOSIS — E662 Morbid (severe) obesity with alveolar hypoventilation: Secondary | ICD-10-CM | POA: Diagnosis present

## 2016-07-16 DIAGNOSIS — Z781 Physical restraint status: Secondary | ICD-10-CM

## 2016-07-16 DIAGNOSIS — J440 Chronic obstructive pulmonary disease with acute lower respiratory infection: Secondary | ICD-10-CM | POA: Diagnosis present

## 2016-07-16 DIAGNOSIS — Z91030 Bee allergy status: Secondary | ICD-10-CM

## 2016-07-16 DIAGNOSIS — J45901 Unspecified asthma with (acute) exacerbation: Secondary | ICD-10-CM | POA: Diagnosis present

## 2016-07-16 DIAGNOSIS — I110 Hypertensive heart disease with heart failure: Secondary | ICD-10-CM | POA: Diagnosis present

## 2016-07-16 DIAGNOSIS — Z86718 Personal history of other venous thrombosis and embolism: Secondary | ICD-10-CM

## 2016-07-16 DIAGNOSIS — Z7984 Long term (current) use of oral hypoglycemic drugs: Secondary | ICD-10-CM | POA: Diagnosis not present

## 2016-07-16 DIAGNOSIS — T380X5A Adverse effect of glucocorticoids and synthetic analogues, initial encounter: Secondary | ICD-10-CM | POA: Diagnosis not present

## 2016-07-16 DIAGNOSIS — J189 Pneumonia, unspecified organism: Secondary | ICD-10-CM | POA: Diagnosis present

## 2016-07-16 DIAGNOSIS — J9612 Chronic respiratory failure with hypercapnia: Secondary | ICD-10-CM | POA: Diagnosis present

## 2016-07-16 DIAGNOSIS — J9622 Acute and chronic respiratory failure with hypercapnia: Secondary | ICD-10-CM | POA: Diagnosis present

## 2016-07-16 DIAGNOSIS — E119 Type 2 diabetes mellitus without complications: Secondary | ICD-10-CM | POA: Diagnosis present

## 2016-07-16 DIAGNOSIS — M109 Gout, unspecified: Secondary | ICD-10-CM | POA: Diagnosis present

## 2016-07-16 DIAGNOSIS — Z888 Allergy status to other drugs, medicaments and biological substances status: Secondary | ICD-10-CM

## 2016-07-16 DIAGNOSIS — I5031 Acute diastolic (congestive) heart failure: Secondary | ICD-10-CM | POA: Diagnosis not present

## 2016-07-16 DIAGNOSIS — M25562 Pain in left knee: Secondary | ICD-10-CM | POA: Diagnosis present

## 2016-07-16 DIAGNOSIS — G9349 Other encephalopathy: Secondary | ICD-10-CM | POA: Diagnosis present

## 2016-07-16 DIAGNOSIS — E872 Acidosis: Secondary | ICD-10-CM | POA: Diagnosis present

## 2016-07-16 DIAGNOSIS — J9601 Acute respiratory failure with hypoxia: Secondary | ICD-10-CM | POA: Diagnosis not present

## 2016-07-16 DIAGNOSIS — F17210 Nicotine dependence, cigarettes, uncomplicated: Secondary | ICD-10-CM | POA: Diagnosis present

## 2016-07-16 DIAGNOSIS — J9611 Chronic respiratory failure with hypoxia: Secondary | ICD-10-CM | POA: Diagnosis present

## 2016-07-16 DIAGNOSIS — J9692 Respiratory failure, unspecified with hypercapnia: Secondary | ICD-10-CM | POA: Diagnosis not present

## 2016-07-16 DIAGNOSIS — J810 Acute pulmonary edema: Secondary | ICD-10-CM

## 2016-07-16 DIAGNOSIS — F418 Other specified anxiety disorders: Secondary | ICD-10-CM | POA: Diagnosis present

## 2016-07-16 DIAGNOSIS — Z9911 Dependence on respirator [ventilator] status: Secondary | ICD-10-CM

## 2016-07-16 DIAGNOSIS — I509 Heart failure, unspecified: Secondary | ICD-10-CM | POA: Diagnosis not present

## 2016-07-16 DIAGNOSIS — R451 Restlessness and agitation: Secondary | ICD-10-CM | POA: Diagnosis present

## 2016-07-16 DIAGNOSIS — J441 Chronic obstructive pulmonary disease with (acute) exacerbation: Secondary | ICD-10-CM | POA: Diagnosis present

## 2016-07-16 DIAGNOSIS — J9690 Respiratory failure, unspecified, unspecified whether with hypoxia or hypercapnia: Secondary | ICD-10-CM

## 2016-07-16 LAB — I-STAT ARTERIAL BLOOD GAS, ED
ACID-BASE EXCESS: 10 mmol/L — AB (ref 0.0–2.0)
ACID-BASE EXCESS: 12 mmol/L — AB (ref 0.0–2.0)
ACID-BASE EXCESS: 11 mmol/L — AB (ref 0.0–2.0)
ACID-BASE EXCESS: 11 mmol/L — AB (ref 0.0–2.0)
BICARBONATE: 38.9 mmol/L — AB (ref 20.0–28.0)
Bicarbonate: 41.7 mmol/L — ABNORMAL HIGH (ref 20.0–28.0)
Bicarbonate: 35.5 mmol/L — ABNORMAL HIGH (ref 20.0–28.0)
Bicarbonate: 32.8 mmol/L — ABNORMAL HIGH (ref 20.0–28.0)
O2 SAT: 85 %
O2 SAT: 92 %
O2 SAT: 94 %
O2 SAT: 95 %
PCO2 ART: 34.1 mmHg (ref 32.0–48.0)
PH ART: 7.591 — AB (ref 7.350–7.450)
PO2 ART: 58 mmHg — AB (ref 83.0–108.0)
Patient temperature: 98.6
Patient temperature: 98.6
TCO2: 41 mmol/L (ref 0–100)
TCO2: 44 mmol/L (ref 0–100)
TCO2: 37 mmol/L (ref 0–100)
TCO2: 34 mmol/L (ref 0–100)
pCO2 arterial: 75.7 mmHg (ref 32.0–48.0)
pCO2 arterial: 85.1 mmHg (ref 32.0–48.0)
pCO2 arterial: 48.3 mmHg — ABNORMAL HIGH (ref 32.0–48.0)
pH, Arterial: 7.32 — ABNORMAL LOW (ref 7.350–7.450)
pH, Arterial: 7.298 — ABNORMAL LOW (ref 7.350–7.450)
pH, Arterial: 7.477 — ABNORMAL HIGH (ref 7.350–7.450)
pO2, Arterial: 75 mmHg — ABNORMAL LOW (ref 83.0–108.0)
pO2, Arterial: 69 mmHg — ABNORMAL LOW (ref 83.0–108.0)
pO2, Arterial: 64 mmHg — ABNORMAL LOW (ref 83.0–108.0)

## 2016-07-16 LAB — CBC WITH DIFFERENTIAL/PLATELET
Basophils Absolute: 0 10*3/uL (ref 0.0–0.1)
Basophils Relative: 0 %
EOS PCT: 1 %
Eosinophils Absolute: 0.1 10*3/uL (ref 0.0–0.7)
HCT: 34.8 % — ABNORMAL LOW (ref 36.0–46.0)
HEMOGLOBIN: 9.8 g/dL — AB (ref 12.0–15.0)
LYMPHS ABS: 2 10*3/uL (ref 0.7–4.0)
Lymphocytes Relative: 11 %
MCH: 24.3 pg — ABNORMAL LOW (ref 26.0–34.0)
MCHC: 28.2 g/dL — ABNORMAL LOW (ref 30.0–36.0)
MCV: 86.4 fL (ref 78.0–100.0)
MONOS PCT: 2 %
Monocytes Absolute: 0.4 10*3/uL (ref 0.1–1.0)
Neutro Abs: 15.5 10*3/uL — ABNORMAL HIGH (ref 1.7–7.7)
Neutrophils Relative %: 86 %
PLATELETS: 345 10*3/uL (ref 150–400)
RBC: 4.03 MIL/uL (ref 3.87–5.11)
RDW: 16.9 % — ABNORMAL HIGH (ref 11.5–15.5)
WBC: 18 10*3/uL — AB (ref 4.0–10.5)

## 2016-07-16 LAB — COMPREHENSIVE METABOLIC PANEL
ALK PHOS: 86 U/L (ref 38–126)
ALT: 19 U/L (ref 14–54)
ANION GAP: 8 (ref 5–15)
AST: 18 U/L (ref 15–41)
Albumin: 3.2 g/dL — ABNORMAL LOW (ref 3.5–5.0)
BUN: <5 mg/dL — ABNORMAL LOW (ref 6–20)
CALCIUM: 8.9 mg/dL (ref 8.9–10.3)
CO2: 33 mmol/L — ABNORMAL HIGH (ref 22–32)
CREATININE: 0.42 mg/dL — AB (ref 0.44–1.00)
Chloride: 98 mmol/L — ABNORMAL LOW (ref 101–111)
Glucose, Bld: 112 mg/dL — ABNORMAL HIGH (ref 65–99)
Potassium: 4.4 mmol/L (ref 3.5–5.1)
Sodium: 139 mmol/L (ref 135–145)
Total Bilirubin: 0.5 mg/dL (ref 0.3–1.2)
Total Protein: 7 g/dL (ref 6.5–8.1)

## 2016-07-16 LAB — I-STAT TROPONIN, ED: TROPONIN I, POC: 0 ng/mL (ref 0.00–0.08)

## 2016-07-16 LAB — URINALYSIS, ROUTINE W REFLEX MICROSCOPIC
Bilirubin Urine: NEGATIVE
GLUCOSE, UA: NEGATIVE mg/dL
HGB URINE DIPSTICK: NEGATIVE
Ketones, ur: NEGATIVE mg/dL
Nitrite: NEGATIVE
PH: 8 (ref 5.0–8.0)
PROTEIN: 100 mg/dL — AB
SQUAMOUS EPITHELIAL / LPF: NONE SEEN
Specific Gravity, Urine: 1.012 (ref 1.005–1.030)

## 2016-07-16 LAB — MAGNESIUM: MAGNESIUM: 2 mg/dL (ref 1.7–2.4)

## 2016-07-16 LAB — I-STAT CG4 LACTIC ACID, ED
LACTIC ACID, VENOUS: 2.43 mmol/L — AB (ref 0.5–1.9)
Lactic Acid, Venous: 1.7 mmol/L (ref 0.5–1.9)

## 2016-07-16 LAB — LACTIC ACID, PLASMA: LACTIC ACID, VENOUS: 2.9 mmol/L — AB (ref 0.5–1.9)

## 2016-07-16 LAB — CBG MONITORING, ED: GLUCOSE-CAPILLARY: 141 mg/dL — AB (ref 65–99)

## 2016-07-16 LAB — PROCALCITONIN: Procalcitonin: 2.26 ng/mL

## 2016-07-16 LAB — PHOSPHORUS

## 2016-07-16 LAB — STREP PNEUMONIAE URINARY ANTIGEN: Strep Pneumo Urinary Antigen: NEGATIVE

## 2016-07-16 MED ORDER — MIDAZOLAM HCL 2 MG/2ML IJ SOLN: 2.0000 mg | INTRAMUSCULAR | Status: DC | PRN

## 2016-07-16 MED ORDER — FENTANYL CITRATE (PF) 100 MCG/2ML IJ SOLN
100.0000 ug | INTRAMUSCULAR | Status: AC | PRN
  Administered 2016-07-16 (×3): 100 ug via INTRAVENOUS
  Filled 2016-07-16 (×3): qty 2

## 2016-07-16 MED ORDER — MIDAZOLAM HCL 2 MG/2ML IJ SOLN
INTRAMUSCULAR | Status: AC
  Filled 2016-07-16: qty 2

## 2016-07-16 MED ORDER — DEXTROSE 5 % IV SOLN
500.0000 mg | INTRAVENOUS | Status: AC
  Administered 2016-07-16 – 2016-07-20 (×5): 500 mg via INTRAVENOUS
  Filled 2016-07-16 (×6): qty 500

## 2016-07-16 MED ORDER — INSULIN ASPART 100 UNIT/ML ~~LOC~~ SOLN
2.0000 [IU] | SUBCUTANEOUS | Status: DC
  Administered 2016-07-16: 2 [IU] via SUBCUTANEOUS
  Filled 2016-07-16: qty 1

## 2016-07-16 MED ORDER — PROPOFOL 10 MG/ML IV BOLUS
INTRAVENOUS | Status: DC | PRN
  Administered 2016-07-16: 50 mg via INTRAVENOUS

## 2016-07-16 MED ORDER — ALBUTEROL SULFATE (2.5 MG/3ML) 0.083% IN NEBU: 5.0000 mg | INHALATION_SOLUTION | Freq: Once | RESPIRATORY_TRACT | Status: DC

## 2016-07-16 MED ORDER — SODIUM CHLORIDE 0.9 % IV SOLN: 250.0000 mL | INTRAVENOUS | Status: DC | PRN

## 2016-07-16 MED ORDER — PROPOFOL 1000 MG/100ML IV EMUL
INTRAVENOUS | Status: AC
  Filled 2016-07-16: qty 100

## 2016-07-16 MED ORDER — IPRATROPIUM-ALBUTEROL 0.5-2.5 (3) MG/3ML IN SOLN
3.0000 mL | Freq: Four times a day (QID) | RESPIRATORY_TRACT | Status: DC
  Administered 2016-07-16 – 2016-07-18 (×9): 3 mL via RESPIRATORY_TRACT
  Filled 2016-07-16 (×8): qty 3

## 2016-07-16 MED ORDER — PROPOFOL 1000 MG/100ML IV EMUL
0.0000 ug/kg/min | INTRAVENOUS | Status: DC
  Administered 2016-07-16 (×2): 60 ug/kg/min via INTRAVENOUS

## 2016-07-16 MED ORDER — FENTANYL CITRATE (PF) 100 MCG/2ML IJ SOLN: 100.0000 ug | INTRAMUSCULAR | Status: DC | PRN

## 2016-07-16 MED ORDER — SODIUM CHLORIDE 0.9 % IV BOLUS (SEPSIS)
1000.0000 mL | Freq: Once | INTRAVENOUS | Status: AC
  Administered 2016-07-16: 1000 mL via INTRAVENOUS

## 2016-07-16 MED ORDER — ALBUTEROL SULFATE (2.5 MG/3ML) 0.083% IN NEBU
2.5000 mg | INHALATION_SOLUTION | Freq: Once | RESPIRATORY_TRACT | Status: AC
  Administered 2016-07-16: 2.5 mg via RESPIRATORY_TRACT

## 2016-07-16 MED ORDER — PROPOFOL 1000 MG/100ML IV EMUL
INTRAVENOUS | Status: AC
Start: 2016-07-16 — End: 2016-07-17
  Filled 2016-07-16: qty 100

## 2016-07-16 MED ORDER — PIPERACILLIN-TAZOBACTAM 3.375 G IVPB
3.3750 g | Freq: Once | INTRAVENOUS | Status: AC
  Administered 2016-07-16: 3.375 g via INTRAVENOUS
  Filled 2016-07-16: qty 50

## 2016-07-16 MED ORDER — ALBUTEROL SULFATE (2.5 MG/3ML) 0.083% IN NEBU
5.0000 mg | INHALATION_SOLUTION | Freq: Once | RESPIRATORY_TRACT | Status: AC
  Administered 2016-07-16: 5 mg via RESPIRATORY_TRACT
  Filled 2016-07-16: qty 6

## 2016-07-16 MED ORDER — SODIUM PHOSPHATES 45 MMOLE/15ML IV SOLN
30.0000 mmol | Freq: Once | INTRAVENOUS | Status: AC
  Administered 2016-07-16: 30 mmol via INTRAVENOUS
  Filled 2016-07-16: qty 10

## 2016-07-16 MED ORDER — SUCCINYLCHOLINE CHLORIDE 20 MG/ML IJ SOLN
INTRAMUSCULAR | Status: AC | PRN
  Administered 2016-07-16: 125 mg via INTRAVENOUS

## 2016-07-16 MED ORDER — FENTANYL CITRATE (PF) 100 MCG/2ML IJ SOLN
100.0000 ug | INTRAMUSCULAR | Status: DC | PRN
  Administered 2016-07-16 (×2): 100 ug via INTRAVENOUS
  Filled 2016-07-16 (×2): qty 2

## 2016-07-16 MED ORDER — ALBUTEROL SULFATE (2.5 MG/3ML) 0.083% IN NEBU: 2.5000 mg | INHALATION_SOLUTION | RESPIRATORY_TRACT | Status: DC | PRN

## 2016-07-16 MED ORDER — BUDESONIDE 0.25 MG/2ML IN SUSP
0.2500 mg | Freq: Two times a day (BID) | RESPIRATORY_TRACT | Status: DC
  Administered 2016-07-17 – 2016-07-20 (×7): 0.25 mg via RESPIRATORY_TRACT
  Filled 2016-07-16 (×7): qty 2

## 2016-07-16 MED ORDER — PROPOFOL 1000 MG/100ML IV EMUL
INTRAVENOUS | Status: AC
  Administered 2016-07-16: 60 ug/kg/min via INTRAVENOUS
  Filled 2016-07-16: qty 100

## 2016-07-16 MED ORDER — PROPOFOL 1000 MG/100ML IV EMUL
0.0000 ug/kg/min | INTRAVENOUS | Status: DC
  Administered 2016-07-16: 45 ug/kg/min via INTRAVENOUS

## 2016-07-16 MED ORDER — HEPARIN SODIUM (PORCINE) 5000 UNIT/ML IJ SOLN
5000.0000 [IU] | Freq: Three times a day (TID) | INTRAMUSCULAR | Status: DC
  Administered 2016-07-16 – 2016-07-24 (×23): 5000 [IU] via SUBCUTANEOUS
  Filled 2016-07-16 (×21): qty 1

## 2016-07-16 MED ORDER — PANTOPRAZOLE SODIUM 40 MG PO PACK
40.0000 mg | PACK | Freq: Every day | ORAL | Status: DC
  Administered 2016-07-17 – 2016-07-19 (×3): 40 mg
  Filled 2016-07-16 (×3): qty 20

## 2016-07-16 MED ORDER — MIDAZOLAM HCL 2 MG/2ML IJ SOLN
1.0000 mg | INTRAMUSCULAR | Status: DC | PRN
  Administered 2016-07-16 – 2016-07-20 (×11): 1 mg via INTRAVENOUS
  Filled 2016-07-16 (×11): qty 2

## 2016-07-16 MED ORDER — DEXTROSE 5 % IV SOLN
2.0000 g | INTRAVENOUS | Status: AC
  Administered 2016-07-17 – 2016-07-22 (×7): 2 g via INTRAVENOUS
  Filled 2016-07-16 (×8): qty 2

## 2016-07-16 MED ORDER — FENTANYL 2500MCG IN NS 250ML (10MCG/ML) PREMIX INFUSION
0.0000 ug/h | INTRAVENOUS | Status: DC
  Administered 2016-07-16: 50 ug/h via INTRAVENOUS
  Administered 2016-07-17 – 2016-07-19 (×7): 400 ug/h via INTRAVENOUS
  Administered 2016-07-19: 100 ug/h via INTRAVENOUS
  Administered 2016-07-20: 200 ug/h via INTRAVENOUS
  Filled 2016-07-16 (×11): qty 250

## 2016-07-16 MED ORDER — PRAVASTATIN SODIUM 40 MG PO TABS
40.0000 mg | ORAL_TABLET | Freq: Every day | ORAL | Status: DC
  Administered 2016-07-17 – 2016-07-20 (×3): 40 mg
  Filled 2016-07-16 (×4): qty 1

## 2016-07-16 MED ORDER — VANCOMYCIN HCL IN DEXTROSE 1-5 GM/200ML-% IV SOLN
1000.0000 mg | Freq: Once | INTRAVENOUS | Status: AC
  Administered 2016-07-16: 1000 mg via INTRAVENOUS
  Filled 2016-07-16: qty 200

## 2016-07-16 MED ORDER — MIDAZOLAM HCL 2 MG/2ML IJ SOLN
INTRAMUSCULAR | Status: DC | PRN
  Administered 2016-07-16: 4 mg via INTRAVENOUS

## 2016-07-16 MED ORDER — ETOMIDATE 2 MG/ML IV SOLN
INTRAVENOUS | Status: AC | PRN
  Administered 2016-07-16: 20 mg via INTRAVENOUS

## 2016-07-16 MED ORDER — PROPOFOL 1000 MG/100ML IV EMUL
5.0000 ug/kg/min | Freq: Once | INTRAVENOUS | Status: DC
  Administered 2016-07-16: 20 ug/kg/min via INTRAVENOUS

## 2016-07-16 NOTE — H&P (Signed)
PULMONARY / CRITICAL CARE MEDICINE   Name: Michele Owens MRN: [id number] DOB: September 25, 1960    ADMISSION DATE:  07/16/2016 CONSULTATION DATE:  07/16/2016  REFERRING MD:  Dr. Jeanell Sparrow, EDP  CHIEF COMPLAINT:  Dyspnea   HISTORY OF PRESENT ILLNESS: Information obtained from medical record as patient is intubated and sedated.  56 year old female active smoker with PMH of asthma, COPD (on 2L [place] at home, last seen by Dr. Halford Chessman 08/2015 for sleep study), Diastolic HF, DM, GERD, and HTN. Presents to ED 4/8 with reported one day history of dyspnea, wheezing, productive cough, and BLE edema. Upon arrival to ED patient was lethargic however, arousable and responsive to questions. CXR revealed right lower lobe opacity, WBC 18, and temp 100.2. ABG 7.320/75.7/58, patient was placed on BIPAP but ultimately required intubation for continued somnolence. PCCM asked to admit.   PAST MEDICAL HISTORY :  She  has a past medical history of Anxiety; Arthritis; Asthma; Chronic diastolic CHF (congestive heart failure) ([place]); COPD (chronic obstructive pulmonary disease) ([place]); Depression; Diabetes mellitus without complication ([place]); GERD (gastroesophageal reflux disease); Gout; Headache; History of DVT of lower extremity; History of nuclear stress test; Hypertension; and Pneumonia.  PAST SURGICAL HISTORY: She  has a past surgical history that includes Bunionectomy (Bilateral); Tonsillectomy; Total abdominal hysterectomy (07/14/10); Cardiac catheterization (N/A, 06/23/2015); and Colonoscopy with propofol (N/A, 12/15/2015).  Allergies  Allergen Reactions  . Bee Venom Swelling and Other (See Comments)    "All over my body" (swelling)  . Ibuprofen Rash    Severe rash  . Lamisil [Terbinafine Hcl] Rash and Other (See Comments)    Pt states this causes her to "feel funny"  . Nsaids Other (See Comments)    Per MD's orders     No current facility-administered medications on file prior to encounter.    Current Outpatient Prescriptions  on File Prior to Encounter  Medication Sig  . acetaminophen (TYLENOL) 325 MG tablet Take 2 tablets (650 mg total) by mouth every 4 (four) hours as needed for headache or mild pain.  . Acetaminophen-Codeine (TYLENOL/CODEINE #3) 300-30 MG tablet Take 1 tablet by mouth every 6 (six) hours as needed for pain.  Marland Kitchen albuterol (ACCUNEB) 1.25 MG/3ML nebulizer solution Take 1 ampule by nebulization 2 (two) times daily.  Marland Kitchen albuterol (PROVENTIL HFA;VENTOLIN HFA) 108 (90 BASE) MCG/ACT inhaler Inhale 1-2 puffs into the lungs every 6 (six) hours as needed for wheezing.  . carvedilol (COREG) 6.25 MG tablet Take 1 tablet (6.25 mg total) by mouth 2 (two) times daily with a meal.  . clonazePAM (KLONOPIN) 2 MG tablet Take 1 tablet (2 mg total) by mouth 2 (two) times daily.  . colchicine 0.6 MG tablet Take 0.6 mg by mouth 2 (two) times daily.  . diclofenac sodium (VOLTAREN) 1 % GEL Apply 2 g topically 4 (four) times daily as needed (for pain).   Marland Kitchen dicyclomine (BENTYL) 20 MG tablet Take 1 tablet (20 mg total) by mouth 2 (two) times daily as needed (abdominal pain/cramping).  . EPINEPHrine 0.3 mg/0.3 mL IJ SOAJ injection Inject 0.3 mg into the muscle daily as needed (allergic reaction).  . furosemide (LASIX) 40 MG tablet Take 80 mg by mouth 2 (two) times daily.  Marland Kitchen HYDROcodone-acetaminophen (NORCO) 7.5-325 MG tablet take 1 tablet by mouth every 12 hours WHEN NECESSARY PAIN (MUST LAST 2 WEEKS); Pt received 28 pills total  . lisinopril (PRINIVIL,ZESTRIL) 2.5 MG tablet TAKE ONE TABLET BY MOUTH EVERY DAY  . loratadine (CLARITIN) 10 MG tablet Take 10 mg  by mouth daily as needed for allergies.   . metFORMIN (GLUCOPHAGE) 500 MG tablet Take 1 tablet (500 mg total) by mouth 2 (two) times daily with a meal.  . mirtazapine (REMERON) 15 MG tablet Take 15 mg by mouth at bedtime.  Marland Kitchen omeprazole (PRILOSEC) 40 MG capsule Take 40 mg by mouth daily.  . potassium chloride (K-DUR,KLOR-CON) 10 MEQ tablet Take 10 mEq by mouth daily.  .  pravastatin (PRAVACHOL) 40 MG tablet Take 40 mg by mouth daily.   Marland Kitchen tiotropium (SPIRIVA) 18 MCG inhalation capsule Place 18 mcg into inhaler and inhale daily.    FAMILY HISTORY:  Her indicated that her mother is alive. She indicated that her father is deceased.    SOCIAL HISTORY: She  reports that she has been smoking Cigarettes.  She has a 20.50 pack-year smoking history. She has never used smokeless tobacco. She reports that she drinks alcohol. She reports that she uses drugs, including Marijuana.  REVIEW OF SYSTEMS:   Unable to review as patient is intubated and sedated.   SUBJECTIVE:  On propofol gtt and mechanical ventilation.   VITAL SIGNS: BP 134/71   Pulse 89   Temp 100.2 F (37.9 C) (Axillary)   Resp (!) 28   Ht 5\' 7"  (1.702 m)   SpO2 99%   HEMODYNAMICS:    VENTILATOR SETTINGS: Vent Mode: PRVC FiO2 (%):  [60 %-100 %] 100 % Set Rate:  [28 bmp] 28 bmp Vt Set:  [520 mL] 520 mL PEEP:  [5 cmH20] 5 cmH20 Plateau Pressure:  [30 cmH20] 30 cmH20  INTAKE / OUTPUT: I/O last 3 completed shifts: In: 335 [I.V.:85; IV Piggyback:250] Out: -   PHYSICAL EXAMINATION: General:  Adult female, no distress  Neuro:  Sedated, does not follow commands, pupils intact  HEENT:  ETT in place  Cardiovascular:  RRR, no MRG, NI S1/S2 Lungs:  Diminished to right base, no crackles/wheezes, non-labored  Abdomen:  Obese, active bowel sounds  Musculoskeletal:  No edema  Skin:  Warm, dry, intact   LABS:  BMET  Recent Labs Lab 07/16/16 1422  NA 139  K 4.4  CL 98*  CO2 33*  BUN <5*  CREATININE 0.42*  GLUCOSE 112*    Electrolytes  Recent Labs Lab 07/16/16 1422  CALCIUM 8.9    CBC  Recent Labs Lab 07/16/16 1422  WBC 18.0*  HGB 9.8*  HCT 34.8*  PLT 345    Coag's No results for input(s): APTT, INR in the last 168 hours.  Sepsis Markers  Recent Labs Lab 07/16/16 1509 07/16/16 1838  LATICACIDVEN 1.70 2.43*    ABG  Recent Labs Lab 07/16/16 1424  07/16/16 1552 07/16/16 1817  PHART 7.320* 7.298* 7.477*  PCO2ART 75.7* 85.1* 48.3*  PO2ART 58.0* 75.0* 69.0*    Liver Enzymes  Recent Labs Lab 07/16/16 1422  AST 18  ALT 19  ALKPHOS 86  BILITOT 0.5  ALBUMIN 3.2*    Cardiac Enzymes No results for input(s): TROPONINI, PROBNP in the last 168 hours.  Glucose No results for input(s): GLUCAP in the last 168 hours.  Imaging Dg Chest Portable 1 View  Result Date: 07/16/2016 CLINICAL DATA:  Intubated EXAM: PORTABLE CHEST 1 VIEW COMPARISON:  07/16/2016 at 1406 hours FINDINGS: Endotracheal tube at/ just above the carina, although partially obscured by overlying leads/ wires. Cardiomegaly with pulmonary vascular congestion. No frank interstitial edema. Patchy right lower lobe opacity, suspicious for pneumonia. No definite pleural effusions. No pneumothorax. Enteric tube courses into the stomach. IMPRESSION: Endotracheal tube  at/just above the carina, although partially obscured. Cardiomegaly with pulmonary vascular congestion. No frank interstitial edema. Patchy right lower lobe opacity, suspicious for pneumonia. Electronically Signed   By: Julian Hy M.D.   On: 07/16/2016 17:04   Dg Chest Port 1 View  Result Date: 07/16/2016 CLINICAL DATA:  Shortness of breath EXAM: PORTABLE CHEST 1 VIEW COMPARISON:  01/25/2016 FINDINGS: Limited rotated study.  Patient unable to help with positioning. Chronic cardiopericardial enlargement. Stable mediastinal contours when allowing for rotation. Bandlike opacities consistent with atelectasis. No convincing change from prior when allowing for differences in soft tissue positioning and obscuration. IMPRESSION: 1. Limited study due to patient factors, especially in evaluating the right base. 2. Patchy bilateral atelectasis or scarring, also seen previously. Cannot exclude superimposed pneumonia at the bases. 3. Cardiomegaly without failure. Electronically Signed   By: Monte Fantasia M.D.   On: 07/16/2016  14:19     STUDIES:  CXR 4/8 > Right lower lobe opacity, suspicious for PNA, cardiomegaly with vascular congestion ECHO 4/8 >>   CULTURES: Blood 4/8 >> Sputum 4/8 >> Urinalysis 4/8 >> RVP 4/8 >> Strep p. 4/8 >> Legionella 4/8 >>  ANTIBIOTICS: Zosyn 4/8 in ED Vancomycin 4/8 in ED Azithromycin 4/8 >> Rocephin 4/8 >>  SIGNIFICANT EVENTS: 4/8 > Presents to ED with dyspnea > intubated   LINES/TUBES: ETT 4/8 >>  DISCUSSION: 56 year old female with COPD/Asthma on home oxygen presents with dyspnea, sputum production, febrile, and WBC 18. Intubated in ED. ABG with hypoxia/hypercarbia. CXR concerning for Right lower lobe PNA.   ASSESSMENT / PLAN:  PULMONARY A: Acute on Chronic hypoxic/hypercarbic respiratory failure +/-CAP, +/-AECOPD P:   Vent Support  Wean as tolerated  CXR/ABG in AM Scheduled Duoneb and Pulmicort  PRN albuterol   CARDIOVASCULAR A:  H/O HTN, HLD, Diastolic HF (ECHO 05/7033 EF 55-60, G2DD) P:  Cardiac Monitoring  Trend BNP Hold home Coreg, lisinopril, lasix   Continue home Pravastatin   RENAL A:   Lactic Acidosis  P:   Trend BMP Replace electrolytes as needed  Trend Lactic Acid   GASTROINTESTINAL A:   H/O GERD P:   PPI NPO  HEMATOLOGIC A:   Anemia  P:  Trend CBC Transfuse for Hbg <7  INFECTIOUS A:   Leukocytosis in setting of presumed CAP  P:   Send Sputum Culture  Follow culture data  Send RVP, Urine Leg. and Step p.  Trend Fever and WBC curve  Azithromycin and Rocephin   ENDOCRINE A:   H/O DM   P:   Hold home metformin  SSI Q4H glucose checks   NEUROLOGIC A:   Encephalopathy in setting of hypercarbia  H/O Anxiety  P:   RASS goal: 0/-1 Wean propofol gtt to achieve RASS goal  PRN fentanyl  Hold home klonopin   FAMILY  - Updates: no family at bedside   - Inter-disciplinary family meet or Palliative Care meeting due by:  4/15   CC Time: 45 minutes   Pulmonary and [place] Pager: [phone]  07/16/2016, 7:49 PM  Attending:  I have seen and examined the patient with nurse practitioner/resident and agree with the note above.  We formulated the plan together and I elicited the following history.    Presented yesterday with worsening dyspnea and hypoxemia, has RLL infiltrate on exam.  Can't give history due to intubation.  Baseline "severe COPD" but no airflow obstruction on PFT.  On home oxygen  On exam Vitals:   07/17/16  0430 07/17/16 0500 07/17/16 0530 07/17/16 0600  BP: 117/62 113/66 121/65 (!) 106/58  Pulse: 77 74 75 76  Resp: (!) 24 (!) 24 (!) 24 (!) 24  Temp:      TempSrc:      SpO2: 100% 100% 100% 100%  Weight:      Height:       Vent Mode: PRVC FiO2 (%):  [60 %-100 %] 100 % Set Rate:  [12 bmp-28 bmp] 24 bmp Vt Set:  [500 mL-520 mL] 500 mL PEEP:  [5 cmH20-12 cmH20] 12 cmH20 Plateau Pressure:  [30 cmH20-36 cmH20] 31 cmH20   Sedated on vent Obese Lungs with a few crackles R base, vent support RRR, no mgr Belly soft, nontender  CXR images independently reviewed: RLL pneumonia, ETT in place  ABG reviewed: resp alkalosis (acute) with hypoxemia  Severe CAP: continue ceftriaxone and azithro, f/u cultures Acute on chronic respiratory failure with hypoxemia: decrease RR to target pCO2 in 60's (likely baseline), decrease FiO2, target SaO2 90% - 94% Start tube feedings Suspect this will take days or more for recovery  My cc time 30 minutes  Roselie Awkward, MD [facility] Pager: [phone] Cell: [phone] After 3pm or if no response, call [phone]

## 2016-07-16 NOTE — ED Triage Notes (Addendum)
Per EMS- Pt has had 1 day of shortness of breath, wheezing with chest pain. Productive white mucous. Pt also reports new edema to left knee and left leg.  Given 125 solumedrol, 10 albuterol and .5 atrovent by EMS.

## 2016-07-16 NOTE — ED Notes (Signed)
Pt suctioned

## 2016-07-16 NOTE — ED Provider Notes (Addendum)
[facility] DEPT Provider Note   CSN: [id number] Arrival date & time: 07/16/16  1323     History   Chief Complaint Chief Complaint  Patient presents with  . Shortness of Breath  . Other    left knee pain  Level V caveat secondary to altered mental status  HPI Michele Owens is a 56 y.o. female. 56 year old female with chronic respiratory failure secondary to COPD, asthma, and continued smoking who is on home oxygen therapy presents today with reports that she has been more dyspneic. On my evaluation the patient is difficult to arouse and answers and some nonsensical words. Per EMS patient had 1 day history of shortness of breath with wheezing. They report cough productive of white egress with edema to legs. She is given Solu-Medrol 125 mg and albuterol and Atrovent and route by EMS  HPI  Past Medical History:  Diagnosis Date  . Anxiety   . Arthritis   . Asthma   . Chronic diastolic CHF (congestive heart failure) ([place])   . COPD (chronic obstructive pulmonary disease) (H[facility])    Uses Oxygen at night  . Depression   . Diabetes mellitus without complication ([place])   . GERD (gastroesophageal reflux disease)   . Gout   . Headache    migraines  . History of DVT of lower extremity   . History of nuclear stress test    Myoview 2/17:  Low risk stress nuclear study with a small, moderate intensity, partially reversible inferior lateral defect consistent with small prior infarct and minimal peri-infarct ischemia; EF 68 with normal wall motion  . Hypertension   . Pneumonia     Patient Active Problem List   Diagnosis Date Noted  . Chronic diastolic heart failure ([place]) 02/10/2016  . Dyspnea   . CAP (community acquired pneumonia) 01/23/2016  . 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 Pulmonary hypertension 01/23/2016  . Sepsis ([place]) 01/23/2016  . Obesity hypoventilation syndrome ([place]) 08/26/2015  . Chest pain 06/16/2015  . Dyslipidemia associated with  type 2 diabetes mellitus ([place]) 04/24/2015  . Leukocytosis 04/24/2015  . Controlled type 2 diabetes mellitus without complication, without long-term current use of insulin ([place]) 04/24/2015  . Anxiety 04/24/2015  . Benign essential HTN 04/24/2015  . Normocytic anemia 04/24/2015  . Asthma with COPD with exacerbation ([place]) 04/24/2015  . OSA (obstructive sleep apnea) 05/10/2012  . Tobacco abuse 07/04/2011    Past Surgical History:  Procedure Laterality Date  . BUNIONECTOMY Bilateral   . CARDIAC CATHETERIZATION N/A 06/23/2015   Procedure: Right/Left Heart Cath and Coronary Angiography;  Surgeon: [name], MD;  Location: [facility];  Service: Cardiovascular;  Laterality: N/A;  . COLONOSCOPY WITH PROPOFOL N/A 12/15/2015   Procedure: COLONOSCOPY WITH PROPOFOL;  Surgeon: [name], MD;  Location: [facility];  Service: Endoscopy;  Laterality: N/A;  . TONSILLECTOMY    . TOTAL ABDOMINAL HYSTERECTOMY  07/14/10    OB History    No data available       Home Medications    Prior to Admission medications   Medication Sig Start Date End Date Taking? Authorizing Provider  acetaminophen (TYLENOL) 325 MG tablet Take 2 tablets (650 mg total) by mouth every 4 (four) hours as needed for headache or mild pain. 06/24/15   Shirley Friar, PA-C  Acetaminophen-Codeine (TYLENOL/CODEINE #3) 300-30 MG tablet Take 1 tablet by mouth every 6 (six) hours as needed for pain.    Historical Provider, MD  albuterol (ACCUNEB) 1.25 MG/3ML  nebulizer solution Take 1 ampule by nebulization 2 (two) times daily.    Historical Provider, MD  albuterol (PROVENTIL HFA;VENTOLIN HFA) 108 (90 BASE) MCG/ACT inhaler Inhale 1-2 puffs into the lungs every 6 (six) hours as needed for wheezing. 04/09/12   Billy Fischer, MD  carvedilol (COREG) 6.25 MG tablet Take 1 tablet (6.25 mg total) by mouth 2 (two) times daily with a meal. 02/10/16   Shirley Friar, PA-C  clonazePAM (KLONOPIN) 2 MG tablet Take 1 tablet (2 mg  total) by mouth 2 (two) times daily. 01/27/16   Maryann Mikhail, DO  colchicine 0.6 MG tablet Take 0.6 mg by mouth 2 (two) times daily.    Historical Provider, MD  diclofenac sodium (VOLTAREN) 1 % GEL Apply 2 g topically 4 (four) times daily as needed (for pain).     Historical Provider, MD  dicyclomine (BENTYL) 20 MG tablet Take 1 tablet (20 mg total) by mouth 2 (two) times daily as needed (abdominal pain/cramping). 11/28/15   Antonietta Breach, PA-C  EPINEPHrine 0.3 mg/0.3 mL IJ SOAJ injection Inject 0.3 mg into the muscle daily as needed (allergic reaction).    Historical Provider, MD  furosemide (LASIX) 40 MG tablet Take 80 mg by mouth 2 (two) times daily.    Historical Provider, MD  HYDROcodone-acetaminophen (NORCO) 7.5-325 MG tablet take 1 tablet by mouth every 12 hours WHEN NECESSARY PAIN (MUST LAST 2 WEEKS); Pt received 28 pills total 09/21/15   Historical Provider, MD  lisinopril (PRINIVIL,ZESTRIL) 2.5 MG tablet TAKE ONE TABLET BY MOUTH EVERY DAY 02/22/16   Shirley Friar, PA-C  loratadine (CLARITIN) 10 MG tablet Take 10 mg by mouth daily as needed for allergies.     Historical Provider, MD  metFORMIN (GLUCOPHAGE) 500 MG tablet Take 1 tablet (500 mg total) by mouth 2 (two) times daily with a meal. 12/11/14   [name], MD  mirtazapine (REMERON) 15 MG tablet Take 15 mg by mouth at bedtime. 11/16/14   Historical Provider, MD  omeprazole (PRILOSEC) 40 MG capsule Take 40 mg by mouth daily. 04/13/15   Historical Provider, MD  potassium chloride (K-DUR,KLOR-CON) 10 MEQ tablet Take 10 mEq by mouth daily.    Historical Provider, MD  pravastatin (PRAVACHOL) 40 MG tablet Take 40 mg by mouth daily.  03/29/15   Historical Provider, MD  tiotropium (SPIRIVA) 18 MCG inhalation capsule Place 18 mcg into inhaler and inhale daily.    Historical Provider, MD    Family History Family History  Problem Relation Age of Onset  . Cancer Father   . Diabetes Mother     Social History Social History    Substance Use Topics  . Smoking status: Current Some Day Smoker    Packs/day: 0.50    Years: 41.00    Types: Cigarettes  . Smokeless tobacco: Never Used     Comment: Starting to [employer] -- using Nicotine Patch  . Alcohol use 0.0 oz/week     Comment: may have a mixed drink once in a blue moon     Allergies   Bee venom; Ibuprofen; Lamisil [terbinafine hcl]; and Nsaids   Review of Systems Review of Systems  Unable to perform ROS: Mental status change     Physical Exam Updated Vital Signs BP 136/69   Pulse 94   Temp 98 F (36.7 C)   Resp (!) 21   SpO2 91%   Physical Exam  Constitutional: She appears well-developed and well-nourished. No distress.  HENT:  Head: Normocephalic and  atraumatic.  Eyes: EOM are normal. Pupils are equal, round, and reactive to light.  Neck: Normal range of motion. Neck supple.  Cardiovascular: Normal rate and regular rhythm.   Pulmonary/Chest: Effort normal.  Rhonchi right base  Abdominal: Soft. Bowel sounds are normal.  Musculoskeletal: Normal range of motion. She exhibits no edema.  Neurological: No cranial nerve deficit.  Patient with decreased responsiveness, opens eyes to questions, but goes back to sleep or answers garbled.   Skin: Skin is warm and dry. Capillary refill takes less than 2 seconds.  Psychiatric: She has a normal mood and affect.  Nursing note and vitals reviewed.    ED Treatments / Results  Labs (all labs ordered are listed, but only abnormal results are displayed) Labs Reviewed  CBC WITH DIFFERENTIAL/PLATELET - Abnormal; Notable for the following:       Result Value   WBC 18.0 (*)    Hemoglobin 9.8 (*)    HCT 34.8 (*)    MCH 24.3 (*)    MCHC 28.2 (*)    RDW 16.9 (*)    Neutro Abs 15.5 (*)    All other components within normal limits  COMPREHENSIVE METABOLIC PANEL - Abnormal; Notable for the following:    Chloride 98 (*)    CO2 33 (*)    Glucose, Bld 112 (*)    BUN <5 (*)    Creatinine, Ser 0.42 (*)     Albumin 3.2 (*)    All other components within normal limits  I-STAT ARTERIAL BLOOD GAS, ED - Abnormal; Notable for the following:    pH, Arterial 7.320 (*)    pCO2 arterial 75.7 (*)    pO2, Arterial 58.0 (*)    Bicarbonate 38.9 (*)    Acid-Base Excess 10.0 (*)    All other components within normal limits  I-STAT ARTERIAL BLOOD GAS, ED - Abnormal; Notable for the following:    pH, Arterial 7.298 (*)    pCO2 arterial 85.1 (*)    pO2, Arterial 75.0 (*)    Bicarbonate 41.7 (*)    Acid-Base Excess 12.0 (*)    All other components within normal limits  CULTURE, BLOOD (ROUTINE X 2)  CULTURE, BLOOD (ROUTINE X 2)  I-STAT TROPOININ, ED  I-STAT CG4 LACTIC ACID, ED    EKG  EKG Interpretation  Date/Time:  Sunday July 16 2016 13:29:53 EDT Ventricular Rate:  96 PR Interval:    QRS Duration: 72 QT Interval:  351 QTC Calculation: 444 R Axis:   79 Text Interpretation:  Sinus rhythm Low voltage, precordial leads Confirmed by Camara Rosander MD, Andee Poles [phone]) on 07/16/2016 1:36:27 PM       Radiology Dg Chest Port 1 View  Result Date: 07/16/2016 CLINICAL DATA:  Shortness of breath EXAM: PORTABLE CHEST 1 VIEW COMPARISON:  01/25/2016 FINDINGS: Limited rotated study.  Patient unable to help with positioning. Chronic cardiopericardial enlargement. Stable mediastinal contours when allowing for rotation. Bandlike opacities consistent with atelectasis. No convincing change from prior when allowing for differences in soft tissue positioning and obscuration. IMPRESSION: 1. Limited study due to patient factors, especially in evaluating the right base. 2. Patchy bilateral atelectasis or scarring, also seen previously. Cannot exclude superimposed pneumonia at the bases. 3. Cardiomegaly without failure. Electronically Signed   By: Monte Fantasia M.D.   On: 07/16/2016 14:19    Procedures Procedures (including critical care time) INTUBATION Performed by: Arabel Barcenas S  Required items: required blood products,  implants, devices, and special equipment available Patient identity confirmed: provided demographic data  and hospital-assigned identification number Time out: Immediately prior to procedure a "time out" was called to verify the correct patient, procedure, equipment, support staff and site/side marked as required.  Indications: Respiratory failure   Intubation method: Glidescope Laryngoscopy   Preoxygenation: nrb, BVM  Sedatives: 20Etomidate Paralytic: 125Succinylcholine  Tube Size: 8 cuffed  Post-procedure assessment: chest rise and ETCO2 monitor Breath sounds: equal and absent over the epigastrium Tube secured with: ETT holder Chest x-Vail Basista interpreted by radiologist and me.  Chest x-Dawsyn Zurn findings: endotracheal tube in appropriate position  Patient tolerated the procedure well with no immediate complications.    Medications Ordered in ED Medications  albuterol (PROVENTIL) (2.5 MG/3ML) 0.083% nebulizer solution 5 mg (not administered)     Initial Impression / Assessment and Plan / ED Course  I have reviewed the triage vital signs and the nursing notes.  Pertinent labs & imaging results that were available during my care of the patient were reviewed by me and considered in my medical decision making (see chart for details).   1- respiratory failure. Likely multifactorial with COPD, probable pneumonia, patient with initial pH 7.3 PCO2 75. Patient given albuterol, antibiotics and BiPAP. PH decreased to pH of 7.29 and PCO2 increased to 85 with continued somnolence. Decision made to intubate patient. 2-anemia appears stable. 3 leukocytosis likely secondary to pneumonia  Discussed with Dr. Ellender Hose and critical care consult pending.  CRITICAL CARE Performed by: Shaune Pollack Total critical care time: 60 minutes Critical care time was exclusive of separately billable procedures and treating other patients. Critical care was necessary to treat or prevent imminent or  life-threatening deterioration. Critical care was time spent personally by me on the following activities: development of treatment plan with patient and/or surrogate as well as nursing, discussions with consultants, evaluation of patient's response to treatment, examination of patient, obtaining history from patient or surrogate, ordering and performing treatments and interventions, ordering and review of laboratory studies, ordering and review of radiographic studies, pulse oximetry and re-evaluation of patient's condition.   Final Clinical Impressions(s) / ED Diagnoses   Final diagnoses:  Respiratory failure with hypercapnia, unspecified chronicity ([place])    New Prescriptions New Prescriptions   No medications on file     Pattricia Boss, MD 07/16/16 1647    Pattricia Boss, MD 07/16/16 [phone]

## 2016-07-16 NOTE — Progress Notes (Signed)
ABG obtained on patient on nasal cannula.  Results given to MD.  Instructed to place patient on bipap.  Patient is now resting comfortably on bipap and is tolerating well.  Will continue to monitor.   Ref. Range 07/16/2016 14:24  Sample type Unknown ARTERIAL  pH, Arterial Latest Ref Range: 7.350 - 7.450  7.320 (L)  pCO2 arterial Latest Ref Range: 32.0 - 48.0 mmHg 75.7 (HH)  pO2, Arterial Latest Ref Range: 83.0 - 108.0 mmHg 58.0 (L)  TCO2 Latest Ref Range: 0 - 100 mmol/L 41  Acid-Base Excess Latest Ref Range: 0.0 - 2.0 mmol/L 10.0 (H)  Bicarbonate Latest Ref Range: 20.0 - 28.0 mmol/L 38.9 (H)  O2 Saturation Latest Units: % 85.0  Patient temperature Unknown 98.6 F  Collection site Unknown RADIAL, ALLEN'[facility] T.Marland KitchenMarland Kitchen

## 2016-07-16 NOTE — ED Notes (Addendum)
Attempted to call the patient's daughter and mother per her request she made prior to intubation. No answer from either. All pt's belongings placed in bag including her purse, shoes, cell phone, wallet, glasses and clothes.

## 2016-07-16 NOTE — Progress Notes (Signed)
[facility] Progress Note Patient Name: Michele Owens DOB: 1961-03-30 MRN: [id number]   Date of Service  07/16/2016  HPI/Events of Note  Multiple issues: 1. Lactic Acid = 1.70 >> 2.43 >> 2.9 and 2. PO4--- < 1.0.  eICU Interventions  Will order: 1. Replace PO4---. 2. Bolus with 0.9 NaCl 1 liter IV over 1 hour now.      Intervention Category Major Interventions: Acid-Base disturbance - evaluation and management;Electrolyte abnormality - evaluation and management  Sommer,Steven Eugene 07/16/2016, 10:44 PM

## 2016-07-16 NOTE — Progress Notes (Signed)
[facility] Progress Note Patient Name: Michele Owens DOB: 19-Nov-1960 MRN: [id number]   Date of Service  07/16/2016  HPI/Events of Note  Agitation - Currently on a Propofol IV infusion at ceiling dose and Fentanyl IV PRN.   eICU Interventions  Will order: 1. Fentanyl IV infusion. Titrate to RASS = 0 to -1. 2. Versed 1 mg IV Q 1 hour PRN. 3. Bilateral Soft wrist restraints.      Intervention Category Minor Interventions: Agitation / anxiety - evaluation and management  Lysle Dingwall 07/16/2016, 9:33 PM

## 2016-07-17 ENCOUNTER — Inpatient Hospital Stay (HOSPITAL_COMMUNITY): Payer: Medicaid Other

## 2016-07-17 ENCOUNTER — Ambulatory Visit: Payer: Medicaid Other | Admitting: Podiatry

## 2016-07-17 DIAGNOSIS — I509 Heart failure, unspecified: Secondary | ICD-10-CM

## 2016-07-17 LAB — BRAIN NATRIURETIC PEPTIDE: B Natriuretic Peptide: 41.4 pg/mL (ref 0.0–100.0)

## 2016-07-17 LAB — BLOOD GAS, ARTERIAL
Acid-Base Excess: 5.9 mmol/L — ABNORMAL HIGH (ref 0.0–2.0)
Bicarbonate: 28.9 mmol/L — ABNORMAL HIGH (ref 20.0–28.0)
Drawn by: 347621
FIO2: 100
MECHVT: 500 mL
O2 Saturation: 98.5 %
PATIENT TEMPERATURE: 99.9
PCO2 ART: 36.3 mmHg (ref 32.0–48.0)
PEEP: 12 cmH2O
PO2 ART: 118 mmHg — AB (ref 83.0–108.0)
RATE: 24 resp/min
pH, Arterial: 7.515 — ABNORMAL HIGH (ref 7.350–7.450)

## 2016-07-17 LAB — GLUCOSE, CAPILLARY
GLUCOSE-CAPILLARY: 102 mg/dL — AB (ref 65–99)
GLUCOSE-CAPILLARY: 108 mg/dL — AB (ref 65–99)
GLUCOSE-CAPILLARY: 141 mg/dL — AB (ref 65–99)
GLUCOSE-CAPILLARY: 148 mg/dL — AB (ref 65–99)
Glucose-Capillary: 120 mg/dL — ABNORMAL HIGH (ref 65–99)
Glucose-Capillary: 126 mg/dL — ABNORMAL HIGH (ref 65–99)
Glucose-Capillary: 128 mg/dL — ABNORMAL HIGH (ref 65–99)
Glucose-Capillary: 146 mg/dL — ABNORMAL HIGH (ref 65–99)
Glucose-Capillary: 154 mg/dL — ABNORMAL HIGH (ref 65–99)
Glucose-Capillary: 170 mg/dL — ABNORMAL HIGH (ref 65–99)
Glucose-Capillary: 426 mg/dL — ABNORMAL HIGH (ref 65–99)

## 2016-07-17 LAB — BASIC METABOLIC PANEL
ANION GAP: 12 (ref 5–15)
BUN: 5 mg/dL — ABNORMAL LOW (ref 6–20)
CO2: 28 mmol/L (ref 22–32)
Calcium: 8.7 mg/dL — ABNORMAL LOW (ref 8.9–10.3)
Chloride: 100 mmol/L — ABNORMAL LOW (ref 101–111)
Creatinine, Ser: 0.52 mg/dL (ref 0.44–1.00)
GFR calc non Af Amer: >60 mL/min (ref >60)
GLUCOSE: 142 mg/dL — AB (ref 65–99)
POTASSIUM: 3.2 mmol/L — AB (ref 3.5–5.1)
Sodium: 140 mmol/L (ref 135–145)

## 2016-07-17 LAB — RESPIRATORY PANEL BY PCR
ADENOVIRUS-RVPPCR: NOT DETECTED
Bordetella pertussis: NOT DETECTED
CORONAVIRUS 229E-RVPPCR: NOT DETECTED
CORONAVIRUS HKU1-RVPPCR: NOT DETECTED
CORONAVIRUS NL63-RVPPCR: NOT DETECTED
Chlamydophila pneumoniae: NOT DETECTED
Coronavirus OC43: NOT DETECTED
Influenza A: NOT DETECTED
Influenza B: NOT DETECTED
MYCOPLASMA PNEUMONIAE-RVPPCR: NOT DETECTED
Metapneumovirus: NOT DETECTED
PARAINFLUENZA VIRUS 1-RVPPCR: NOT DETECTED
PARAINFLUENZA VIRUS 4-RVPPCR: NOT DETECTED
Parainfluenza Virus 2: NOT DETECTED
Parainfluenza Virus 3: NOT DETECTED
Respiratory Syncytial Virus: NOT DETECTED
Rhinovirus / Enterovirus: NOT DETECTED

## 2016-07-17 LAB — POCT I-STAT 3, ART BLOOD GAS (G3+)
ACID-BASE EXCESS: 8 mmol/L — AB (ref 0.0–2.0)
Bicarbonate: 31.4 mmol/L — ABNORMAL HIGH (ref 20.0–28.0)
O2 Saturation: 93 %
PH ART: 7.518 — AB (ref 7.350–7.450)
TCO2: 33 mmol/L (ref 0–100)
pCO2 arterial: 38.7 mmHg (ref 32.0–48.0)
pO2, Arterial: 59 mmHg — ABNORMAL LOW (ref 83.0–108.0)

## 2016-07-17 LAB — PHOSPHORUS
Phosphorus: 2.3 mg/dL — ABNORMAL LOW (ref 2.5–4.6)
Phosphorus: 4.1 mg/dL (ref 2.5–4.6)
Phosphorus: 4.8 mg/dL — ABNORMAL HIGH (ref 2.5–4.6)

## 2016-07-17 LAB — CBC
HEMATOCRIT: 31 % — AB (ref 36.0–46.0)
HEMOGLOBIN: 9.2 g/dL — AB (ref 12.0–15.0)
MCH: 24.6 pg — ABNORMAL LOW (ref 26.0–34.0)
MCHC: 29.7 g/dL — AB (ref 30.0–36.0)
MCV: 82.9 fL (ref 78.0–100.0)
Platelets: 361 10*3/uL (ref 150–400)
RBC: 3.74 MIL/uL — AB (ref 3.87–5.11)
RDW: 17.1 % — ABNORMAL HIGH (ref 11.5–15.5)
WBC: 19 10*3/uL — ABNORMAL HIGH (ref 4.0–10.5)

## 2016-07-17 LAB — MAGNESIUM
Magnesium: 1.9 mg/dL (ref 1.7–2.4)
Magnesium: 1.9 mg/dL (ref 1.7–2.4)
Magnesium: 2 mg/dL (ref 1.7–2.4)

## 2016-07-17 LAB — PROCALCITONIN: PROCALCITONIN: 1.7 ng/mL

## 2016-07-17 LAB — LACTIC ACID, PLASMA: Lactic Acid, Venous: 3.3 mmol/L (ref 0.5–1.9)

## 2016-07-17 LAB — ECHOCARDIOGRAM COMPLETE
HEIGHTINCHES: 67 in
WEIGHTICAEL: 4701.97 [oz_av]

## 2016-07-17 LAB — LEGIONELLA PNEUMOPHILA SEROGP 1 UR AG: L. PNEUMOPHILA SEROGP 1 UR AG: NEGATIVE

## 2016-07-17 LAB — MRSA PCR SCREENING: MRSA by PCR: NEGATIVE

## 2016-07-17 MED ORDER — SODIUM CHLORIDE 0.9 % IV SOLN
INTRAVENOUS | Status: DC
  Administered 2016-07-17: 02:00:00 via INTRAVENOUS

## 2016-07-17 MED ORDER — PRO-STAT SUGAR FREE PO LIQD
30.0000 mL | Freq: Two times a day (BID) | ORAL | Status: DC
  Filled 2016-07-17: qty 30

## 2016-07-17 MED ORDER — VITAL HIGH PROTEIN PO LIQD
1000.0000 mL | ORAL | Status: DC
  Administered 2016-07-18 – 2016-07-19 (×2): 1000 mL

## 2016-07-17 MED ORDER — SODIUM CHLORIDE 0.9 % IV SOLN: INTRAVENOUS | Status: DC

## 2016-07-17 MED ORDER — SODIUM CHLORIDE 0.9 % IV SOLN
INTRAVENOUS | Status: DC
  Filled 2016-07-17: qty 2.5

## 2016-07-17 MED ORDER — DEXTROSE-NACL 5-0.45 % IV SOLN
INTRAVENOUS | Status: DC
  Administered 2016-07-17 (×2): via INTRAVENOUS

## 2016-07-17 MED ORDER — PERFLUTREN LIPID MICROSPHERE
1.0000 mL | INTRAVENOUS | Status: AC | PRN
  Administered 2016-07-17: 4 mL via INTRAVENOUS
  Filled 2016-07-17: qty 10

## 2016-07-17 MED ORDER — CHLORHEXIDINE GLUCONATE 0.12% ORAL RINSE (MEDLINE KIT)
15.0000 mL | Freq: Two times a day (BID) | OROMUCOSAL | Status: DC
  Administered 2016-07-17 – 2016-07-20 (×8): 15 mL via OROMUCOSAL

## 2016-07-17 MED ORDER — SODIUM CHLORIDE 0.9 % IV BOLUS (SEPSIS)
1000.0000 mL | Freq: Once | INTRAVENOUS | Status: AC
  Administered 2016-07-17: 1000 mL via INTRAVENOUS

## 2016-07-17 MED ORDER — INSULIN REGULAR BOLUS VIA INFUSION
0.0000 [IU] | Freq: Three times a day (TID) | INTRAVENOUS | Status: DC
  Filled 2016-07-17: qty 10

## 2016-07-17 MED ORDER — PROPOFOL 1000 MG/100ML IV EMUL
0.0000 ug/kg/min | INTRAVENOUS | Status: DC
  Administered 2016-07-17: 30 ug/kg/min via INTRAVENOUS
  Administered 2016-07-17 (×4): 50 ug/kg/min via INTRAVENOUS
  Administered 2016-07-17 – 2016-07-18 (×3): 40 ug/kg/min via INTRAVENOUS
  Administered 2016-07-18: 45 ug/kg/min via INTRAVENOUS
  Administered 2016-07-18: 50 ug/kg/min via INTRAVENOUS
  Administered 2016-07-18: 40 ug/kg/min via INTRAVENOUS
  Administered 2016-07-18 (×2): 45 ug/kg/min via INTRAVENOUS
  Administered 2016-07-19 (×3): 50 ug/kg/min via INTRAVENOUS
  Administered 2016-07-19: 40 ug/kg/min via INTRAVENOUS
  Administered 2016-07-19: 50 ug/kg/min via INTRAVENOUS
  Administered 2016-07-19: 15 ug/kg/min via INTRAVENOUS
  Administered 2016-07-19: 50 ug/kg/min via INTRAVENOUS
  Administered 2016-07-20: 35 ug/kg/min via INTRAVENOUS
  Administered 2016-07-20: 20 ug/kg/min via INTRAVENOUS
  Filled 2016-07-17 (×23): qty 100

## 2016-07-17 MED ORDER — VITAL HIGH PROTEIN PO LIQD
1000.0000 mL | ORAL | Status: DC
  Administered 2016-07-17: 11:00:00
  Administered 2016-07-17: 1000 mL

## 2016-07-17 MED ORDER — DEXTROSE 50 % IV SOLN: 25.0000 mL | INTRAVENOUS | Status: DC | PRN

## 2016-07-17 MED ORDER — POTASSIUM CHLORIDE 20 MEQ/15ML (10%) PO SOLN
30.0000 meq | ORAL | Status: AC
  Administered 2016-07-17 (×2): 30 meq
  Filled 2016-07-17 (×2): qty 30

## 2016-07-17 MED ORDER — PRO-STAT SUGAR FREE PO LIQD
30.0000 mL | Freq: Every day | ORAL | Status: DC
  Administered 2016-07-17 – 2016-07-20 (×14): 30 mL
  Filled 2016-07-17 (×14): qty 30

## 2016-07-17 MED ORDER — PERFLUTREN LIPID MICROSPHERE
INTRAVENOUS | Status: AC
  Administered 2016-07-17: 4 mL via INTRAVENOUS
  Filled 2016-07-17: qty 10

## 2016-07-17 MED ORDER — ORAL CARE MOUTH RINSE
15.0000 mL | Freq: Four times a day (QID) | OROMUCOSAL | Status: DC
  Administered 2016-07-17 – 2016-07-20 (×14): 15 mL via OROMUCOSAL

## 2016-07-17 MED ORDER — INSULIN ASPART 100 UNIT/ML ~~LOC~~ SOLN
4.0000 [IU] | SUBCUTANEOUS | Status: DC
  Administered 2016-07-17 – 2016-07-20 (×16): 4 [IU] via SUBCUTANEOUS

## 2016-07-17 MED ORDER — FREE WATER
200.0000 mL | Status: DC
  Administered 2016-07-17 – 2016-07-18 (×6): 200 mL

## 2016-07-17 MED ORDER — SODIUM CHLORIDE 0.9 % IV SOLN
INTRAVENOUS | Status: DC
  Administered 2016-07-17: 3.7 [IU]/h via INTRAVENOUS
  Filled 2016-07-17 (×2): qty 2.5

## 2016-07-17 MED ORDER — INSULIN ASPART 100 UNIT/ML ~~LOC~~ SOLN
0.0000 [IU] | SUBCUTANEOUS | Status: DC
  Administered 2016-07-17 – 2016-07-19 (×3): 3 [IU] via SUBCUTANEOUS
  Administered 2016-07-19: 4 [IU] via SUBCUTANEOUS
  Administered 2016-07-19 – 2016-07-20 (×2): 3 [IU] via SUBCUTANEOUS
  Administered 2016-07-20: 4 [IU] via SUBCUTANEOUS
  Administered 2016-07-20: 3 [IU] via SUBCUTANEOUS
  Administered 2016-07-20: 4 [IU] via SUBCUTANEOUS
  Administered 2016-07-20 (×2): 3 [IU] via SUBCUTANEOUS

## 2016-07-17 MED ORDER — CHLORHEXIDINE GLUCONATE 0.12 % MT SOLN
OROMUCOSAL | Status: AC
  Filled 2016-07-17: qty 15

## 2016-07-17 MED ORDER — SODIUM CHLORIDE 0.9 % IV BOLUS (SEPSIS): 1000.0000 mL | Freq: Once | INTRAVENOUS | Status: DC

## 2016-07-17 NOTE — Progress Notes (Signed)
  Echocardiogram 2D Echocardiogram has been performed.  Darlina Sicilian M 07/17/2016, 4:14 PM

## 2016-07-17 NOTE — Progress Notes (Signed)
Decreased Vt to 8cc (500) and Rate to 24 per MD order Warren Lacy)

## 2016-07-17 NOTE — Progress Notes (Addendum)
Initial Nutrition Assessment  DOCUMENTATION CODES:   Morbid obesity  INTERVENTION:   Tube feeds via OG tube- Vital HP at goal rate 43ml/hr   Prostat liquid protein 30 ml five times a day via OG tube provides 100 kcal,15 grams protein per serving.   Tube feeds plus propofol and Prostat provides 1769 kcal/day (101% estimated needs) 96g/day protein (78% estimated needs) 262ml/day water  Free water flushes 231ml every 4 hrs (1450ml/day total with tube feeds)  NUTRITION DIAGNOSIS:   Inadequate oral intake related to patient sedated and on vent as evidenced by NPO status.  GOAL:   Provide needs based on ASPEN/SCCM guidelines  MONITOR:   Vent status, Labs, Weight trends, TF tolerance, I & O's  REASON FOR ASSESSMENT:   Consult Enteral/tube feeding initiation and management  ASSESSMENT:   56 year old female active smoker with PMH of asthma, COPD (on 2L [place] at home, last seen by Dr. Halford Chessman 08/2015 for sleep study), Diastolic HF, DM, GERD, and HTN. Presents to ED 4/8 with reported one day history of dyspnea, wheezing, productive cough, and BLE edema. Upon arrival to ED patient was lethargic however, arousable and responsive to questions. CXR revealed right lower lobe opacity, WBC 18, and temp 100.2. ABG 7.320/75.7/58, patient was placed on BIPAP but ultimately required intubation for continued somnolence.  Per chart, pt is weight stable.   Medications reviewed and include: azithromycin, ceftriaxone, heparin, insulin, protonix, KCl, NaCl with dextrose, fentanyl, propofol  Labs reviewed: K 3.2(L), Cl 100(L), BUN 5(L), Ca 8.7(L), P 4.1 wnl, Mg 1.9 wnl, lactic acid 2.3(H) WBC- 19(H), Hgb 9.2(L), Hct 31(L) cbgs- 112, 142 x 24hrs AIC 6.4(H)  Nutrition-Focused physical exam completed. Findings are no fat depletion, no muscle depletion, and no edema.   Patient is currently intubated on ventilator support MV: 8.3 L/min Temp (24hrs), Avg:98.6 F (37 C), Min:96.6 F (35.9 C), Max:100.2 F  (37.9 C)  Propofol: 39 ml/hr  Diet Order:  Diet NPO time specified  Skin:  Reviewed, no issues  Last BM:  none since admit  Height:   Ht Readings from Last 1 Encounters:  07/17/16 5\' 7"  (1.702 m)    Weight:   Wt Readings from Last 1 Encounters:  07/17/16 293 lb 14 oz (133.3 kg)    Ideal Body Weight:  61.4 kg  BMI:  Body mass index is 46.03 kg/m.  Estimated Nutritional Needs:   Kcal:  1450-1750kcal/day   Protein:  123-135g/day   Fluid:  >1.5L/day   EDUCATION NEEDS:   Education needs no appropriate at this time  Koleen Distance, RD, [place] Pager #[phone]

## 2016-07-17 NOTE — Progress Notes (Signed)
[facility] Progress Note Patient Name: Michele Owens DOB: [birth year] MRN: [id number]   Date of Service  07/17/2016  HPI/Events of Note  LA 3.3, Glucose 426 ABG 7.59/34/64/95%  eICU Interventions  Pt is getting 1st lt IVF, order 1 more Lt then infusion at 125cc/hr Insulin drip Adjust vent to RR 24 (from 28) Reduce TV to 500 (8cc/kg)  from 520.     Intervention Category Major Interventions: Other:  Zia Kanner 07/17/2016, 1:08 AM

## 2016-07-17 NOTE — Progress Notes (Signed)
[facility] ADULT ICU REPLACEMENT PROTOCOL FOR AM LAB REPLACEMENT ONLY  The patient does apply for the [facility] Adult ICU Electrolyte Replacment Protocol based on the criteria listed below:   1. Is GFR >/= 40 ml/min? Yes.    Patient's GFR today is >60 2. Is urine output >/= 0.5 ml/kg/hr for the last 6 hours? Yes.   Patient's UOP is 1.17 ml/kg/hr 3. Is BUN < 60 mg/dL? Yes.    Patient's BUN today is 5 4. Abnormal electrolyte  K 3.2, phos 2.3 5. Ordered repletion with: per protocol 6. If a panic level lab has been reported, has the CCM MD in charge been notified? Yes.  .   Physician:  Rosalia Hammers 07/17/2016 4:20 AM

## 2016-07-17 NOTE — Progress Notes (Signed)
Attempted to call the patient's daughter, [name], but no one answered. Was able to speak to the patient's mother and let her know her daughter has been admitted into our unit and is on the ventilator. She will try to contact the patient's daughter and sister. Michele Owens C 7:37 AM

## 2016-07-17 NOTE — Care Management Note (Signed)
Case Management Note  Patient Details  Name: Michele Owens MRN: [id number] Date of Birth: May 15, 1960  Subjective/Objective:   Pt admitted on 07/16/16 with acute respiratory failure.                 Action/Plan: Pt currently remains intubated; will follow for discharge planning as pt progresses.    Expected Discharge Date:                  Expected Discharge Plan:  [name]  In-House Referral:     Discharge planning Services  CM Consult  Post Acute Care Choice:    Choice offered to:     DME Arranged:    DME Agency:     HH Arranged:    [place] Agency:     Status of Service:  In process, will continue to follow  If discussed at Long Length of Stay Meetings, dates discussed:    Additional Comments:  Reinaldo Raddle, RN, BSN  Trauma/Neuro ICU Case Manager [phone]

## 2016-07-17 NOTE — Progress Notes (Signed)
Pt transported to 62M 01 without event and RT given report.

## 2016-07-18 ENCOUNTER — Inpatient Hospital Stay (HOSPITAL_COMMUNITY): Payer: Medicaid Other

## 2016-07-18 DIAGNOSIS — J9621 Acute and chronic respiratory failure with hypoxia: Secondary | ICD-10-CM

## 2016-07-18 DIAGNOSIS — Z9911 Dependence on respirator [ventilator] status: Secondary | ICD-10-CM

## 2016-07-18 LAB — BLOOD GAS, ARTERIAL
Acid-Base Excess: 3.6 mmol/L — ABNORMAL HIGH (ref 0.0–2.0)
Bicarbonate: 29.3 mmol/L — ABNORMAL HIGH (ref 20.0–28.0)
DRAWN BY: 398991
FIO2: 100
MECHVT: 450 mL
O2 Saturation: 96.9 %
PATIENT TEMPERATURE: 98.6
PCO2 ART: 59.7 mmHg — AB (ref 32.0–48.0)
PEEP: 12 cmH2O
PO2 ART: 103 mmHg (ref 83.0–108.0)
RATE: 12 resp/min
pH, Arterial: 7.312 — ABNORMAL LOW (ref 7.350–7.450)

## 2016-07-18 LAB — BASIC METABOLIC PANEL
ANION GAP: 9 (ref 5–15)
BUN: 5 mg/dL — AB (ref 6–20)
CALCIUM: 8.8 mg/dL — AB (ref 8.9–10.3)
CO2: 29 mmol/L (ref 22–32)
CREATININE: 0.43 mg/dL — AB (ref 0.44–1.00)
Chloride: 101 mmol/L (ref 101–111)
GFR calc Af Amer: >60 mL/min (ref >60)
GLUCOSE: 108 mg/dL — AB (ref 65–99)
Potassium: 3.5 mmol/L (ref 3.5–5.1)
Sodium: 139 mmol/L (ref 135–145)

## 2016-07-18 LAB — PHOSPHORUS
Phosphorus: 5 mg/dL — ABNORMAL HIGH (ref 2.5–4.6)
Phosphorus: 4.2 mg/dL (ref 2.5–4.6)

## 2016-07-18 LAB — CBC
HEMATOCRIT: 30.5 % — AB (ref 36.0–46.0)
Hemoglobin: 8.9 g/dL — ABNORMAL LOW (ref 12.0–15.0)
MCH: 24.5 pg — AB (ref 26.0–34.0)
MCHC: 29.2 g/dL — AB (ref 30.0–36.0)
MCV: 84 fL (ref 78.0–100.0)
PLATELETS: 323 10*3/uL (ref 150–400)
RBC: 3.63 MIL/uL — ABNORMAL LOW (ref 3.87–5.11)
RDW: 17.7 % — AB (ref 11.5–15.5)
WBC: 20.1 10*3/uL — ABNORMAL HIGH (ref 4.0–10.5)

## 2016-07-18 LAB — GLUCOSE, CAPILLARY
GLUCOSE-CAPILLARY: 95 mg/dL (ref 65–99)
GLUCOSE-CAPILLARY: 118 mg/dL — AB (ref 65–99)
Glucose-Capillary: 107 mg/dL — ABNORMAL HIGH (ref 65–99)
Glucose-Capillary: 109 mg/dL — ABNORMAL HIGH (ref 65–99)
Glucose-Capillary: 123 mg/dL — ABNORMAL HIGH (ref 65–99)
Glucose-Capillary: 103 mg/dL — ABNORMAL HIGH (ref 65–99)

## 2016-07-18 LAB — PROCALCITONIN: PROCALCITONIN: 2.44 ng/mL

## 2016-07-18 LAB — MAGNESIUM
Magnesium: 2.2 mg/dL (ref 1.7–2.4)
Magnesium: 2 mg/dL (ref 1.7–2.4)

## 2016-07-18 LAB — LACTIC ACID, PLASMA: Lactic Acid, Venous: 2.3 mmol/L (ref 0.5–1.9)

## 2016-07-18 MED ORDER — CHLORHEXIDINE GLUCONATE 0.12 % MT SOLN
OROMUCOSAL | Status: AC
  Filled 2016-07-18: qty 15

## 2016-07-18 MED ORDER — FUROSEMIDE 10 MG/ML IJ SOLN
20.0000 mg | INTRAMUSCULAR | Status: AC
  Administered 2016-07-18 (×2): 20 mg via INTRAVENOUS
  Filled 2016-07-18 (×2): qty 2

## 2016-07-18 MED ORDER — POTASSIUM CHLORIDE 20 MEQ/15ML (10%) PO SOLN
40.0000 meq | ORAL | Status: AC
  Administered 2016-07-18: 40 meq
  Filled 2016-07-18: qty 30

## 2016-07-18 MED ORDER — FUROSEMIDE 10 MG/ML IJ SOLN: 40.0000 mg | Freq: Once | INTRAMUSCULAR | Status: DC

## 2016-07-18 NOTE — Progress Notes (Signed)
PT was moved from 13M to 4N without incident

## 2016-07-18 NOTE — Progress Notes (Signed)
PULMONARY / CRITICAL CARE MEDICINE   Name: Michele Owens MRN: [id number] DOB: Aug 20, 1960    ADMISSION DATE:  07/16/2016 CONSULTATION DATE:  07/16/2016  REFERRING MD:  Dr. Jeanell Sparrow, EDP  CHIEF COMPLAINT:  Dyspnea   Brief Patient Summary:   56 year old female active smoker with PMH of asthma, COPD (on 2L [place] at home, last seen by Dr. Halford Chessman 08/2015 for sleep study), Diastolic HF, DM, GERD, and HTN. Presents to ED 4/8 with reported one day history of dyspnea, wheezing, productive cough, and BLE edema. Upon arrival to ED patient was lethargic however, arousable and responsive to questions. CXR revealed right lower lobe opacity, WBC 18, and temp 100.2. ABG 7.320/75.7/58, patient was placed on BIPAP but ultimately required intubation for continued somnolence. PCCM asked to admit.   SUBJECTIVE:  Sedate on propofol and fentanyl gtt.  BP borderline.  Afebrile.  VITAL SIGNS: BP (!) 92/45   Pulse (!) 57   Temp 98 F (36.7 C) (Axillary)   Resp 18   Ht 5\' 7"  (1.702 m)   Wt 298 lb 11.6 oz (135.5 kg)   SpO2 100%   BMI 46.79 kg/m   HEMODYNAMICS:    VENTILATOR SETTINGS: Vent Mode: PRVC FiO2 (%):  [60 %] 60 % Set Rate:  [18 bmp] 18 bmp Vt Set:  [450 mL] 450 mL PEEP:  [5 cmH20-12 cmH20] 5 cmH20 Plateau Pressure:  [22 cmH20-25 cmH20] 22 cmH20  INTAKE / OUTPUT: I/O last 3 completed shifts: In: 5697.2 [I.V.:4537.2; NG/GT:560; IV Piggyback:600] Out: 3664 [Urine:3155; Emesis/NG output:375]  PHYSICAL EXAMINATION: General:  Adult obese female sedate on MV, no distress HEENT: MM pink/moist, ETT Neuro: sedated, awakens to voice, follows simple commands, MAE CV: distant s1s2 rrr, no m/r/g PULM: even/non-labored, lungs bilaterally coarse, faint scattered wheeze, diminished bibasilar QI:HKVQ, non-tender, bsx4 active, obese Extremities: warm/dry, no edema  Skin: no rashes or lesions  LABS:  BMET  Recent Labs Lab 07/16/16 1422 07/17/16 0252 07/18/16 0417  NA 139 140 139  K 4.4 3.2* 3.5  CL 98*  100* 101  CO2 33* 28 29  BUN <5* 5* 5*  CREATININE 0.42* 0.52 0.43*  GLUCOSE 112* 142* 108*    Electrolytes  Recent Labs Lab 07/16/16 1422  07/17/16 0252 07/17/16 0756 07/17/16 1551 07/18/16 0417  CALCIUM 8.9  --  8.7*  --   --  8.8*  MG  --   < > 1.9 1.9 2.0 2.2  PHOS  --   < > 2.3* 4.1 4.8* 5.0*  < > = values in this interval not displayed.  CBC  Recent Labs Lab 07/16/16 1422 07/17/16 0252 07/18/16 0417  WBC 18.0* 19.0* 20.1*  HGB 9.8* 9.2* 8.9*  HCT 34.8* 31.0* 30.5*  PLT 345 361 323    Coag's No results for input(s): APTT, INR in the last 168 hours.  Sepsis Markers  Recent Labs Lab 07/16/16 2106 07/17/16 0003 07/17/16 0252 07/18/16 0417  LATICACIDVEN 2.9* 3.3* 2.3*  --   PROCALCITON 2.26  --  1.70 2.44    ABG  Recent Labs Lab 07/16/16 2232 07/17/16 0345 07/17/16 0943  PHART 7.591* 7.515* 7.518*  PCO2ART 34.1 36.3 38.7  PO2ART 64.0* 118* 59.0*    Liver Enzymes  Recent Labs Lab 07/16/16 1422  AST 18  ALT 19  ALKPHOS 86  BILITOT 0.5  ALBUMIN 3.2*    Cardiac Enzymes No results for input(s): TROPONINI, PROBNP in the last 168 hours.  Glucose  Recent Labs Lab 07/17/16 1219 07/17/16 1529 07/17/16 2005 07/17/16  2340 07/18/16 0352 07/18/16 0716  GLUCAP 126* 102* 120* 108* 107* 109*    Imaging Dg Chest Port 1 View  Result Date: 07/18/2016 CLINICAL DATA:  Acute on chronic respiratory failure with hypoxemia EXAM: PORTABLE CHEST 1 VIEW COMPARISON:  07/17/2016 FINDINGS: Endotracheal tube and NG tube remain in place, unchanged. There is cardiomegaly with vascular congestion. Bilateral lower lobe airspace opacities with probable bilateral effusions. No acute bony abnormality. IMPRESSION: Cardiomegaly with vascular congestion. Stable bibasilar atelectasis or infiltrates with small effusions. Electronically Signed   By: Rolm Baptise M.D.   On: 07/18/2016 07:11   Dg Abd Portable 1v  Result Date: 07/17/2016 CLINICAL DATA:  Post NG tube  adjustment EXAM: PORTABLE ABDOMEN - 1 VIEW COMPARISON:  07/17/2016 FINDINGS: Advancement of the NG tube with the tip in the distal stomach. No evidence of bowel obstruction or free air. IMPRESSION: Advancement of the NG tube with the tip likely in the distal stomach. Electronically Signed   By: Rolm Baptise M.D.   On: 07/17/2016 09:05     STUDIES:  CXR 4/8 > Right lower lobe opacity, suspicious for PNA, cardiomegaly with vascular congestion ECHO 4/8 >> LVEF 24-26%, grade 2 diastolic dysfunction, mild TR  CULTURES: MRSA PCR 4/9 >> negative Blood 4/8 >> Sputum 4/8 >> RVP 4/8 >> negative Strep p. 4/8 >> negative Legionella 4/8 >> negative  ANTIBIOTICS: Zosyn 4/8 in ED Vancomycin 4/8 in ED Azithromycin 4/8 >> Rocephin 4/8 >>  SIGNIFICANT EVENTS: 4/8 > Presents to ED with dyspnea > intubated   LINES/TUBES: ETT 4/8 >>  DISCUSSION: 56 year old female with COPD/Asthma on home oxygen presents with dyspnea, sputum production, febrile, and WBC 18. Intubated in ED. ABG with hypoxia/hypercarbia. CXR concerning for Right lower lobe PNA.   ASSESSMENT / PLAN:  PULMONARY A: Acute on Chronic hypoxic/hypercarbic respiratory failure +/-CAP, +/-AECOPD Tobacco Abuse Hx COPD/Asthma on home O2 2L P:   PRVC 80ml/kg Wean FiO2/peep for sats 90-94% Repeat ABG today Wean as tolerated Continue Duoneb and Pulmicort PRN albuterol Follow CXR Smoking cessation when appriopiate  CARDIOVASCULAR A:  H/O HTN, HLD, Diastolic HF (ECHO 11/3417 EF 55-60, G2DD) P:  ICU Monitoring  Hold home Coreg, lisinopril, lasix   Continue home Pravastatin   RENAL A:   Lactic Acidosis- clearing Metabolic alkalosis- ? From prior diuresis P:   Trend BMP Replace electrolytes as needed  D5/0.45 NS at 16ml/hr  GASTROINTESTINAL A:   H/O GERD P:   Continue TF Continue PPI for SUP  HEMATOLOGIC A:   Anemia  P:  Trend CBC Transfuse for Hbg <7 Heparin SQ/SCD for VTE  INFECTIOUS A:   Leukocytosis in  setting of presumed CAP/RLL infiltrate  P:   Follow Cultures Continue Azithromycin and Rocephin RVP, Urine Leg and Strep p negative Trend fever/WBC curve  ENDOCRINE A:   H/O DM   P:   SSI Q4 CBGs Hold home metformin  NEUROLOGIC A:   Encephalopathy in setting of hypercarbia  H/O Anxiety  P:   RASS goal: 0/-1 Wean propofol gtt to achieve RASS goal/keep map >65 Fentanyl gtt  Versed prn  Hold home klonopin   FAMILY  - Updates: no family at bedside   - Inter-disciplinary family meet or Palliative Care meeting due by:  4/15  CC Time:  30 minutes   Kennieth Rad, AGACNP-BC [facility] Pgr: [phone] or if no answer [phone] 07/18/2016, 7:54 AM    ATTENDING NOTE / ATTESTATION NOTE :   I have discussed the case  with the resident/APP  Kennieth Rad NP.   I agree with the resident/APP's  history, physical examination, assessment, and plans.    I have edited the above note and modified it according to our agreed history, physical examination, assessment and plan.   Briefly, 56 year old female active smoker with PMH of asthma, COPD (on 2L [place] at home, last seen by Dr. Halford Chessman 08/2015 for sleep study), Diastolic HF, DM, GERD, and HTN. Presents to ED 4/8 with reported one day history of dyspnea, wheezing, productive cough, and BLE edema. Upon arrival to ED patient was lethargic however, arousable and responsive to questions. CXR revealed right lower lobe opacity, WBC 18, and temp 100.2. ABG 7.320/75.7/58, patient was placed on BIPAP but ultimately required intubation for continued somnolence. PCCM asked to admit.   No issues overnight. Still on 60% FiO2, PEEP 12.   Vitals:  Vitals:   07/18/16 0700 07/18/16 0746 07/18/16 0800 07/18/16 0830  BP: (!) 90/53 (!) 92/45 (!) 93/46 (!) 100/49  Pulse: 61 (!) 57 (!) 58 62  Resp: 18 18 18 19   Temp:      TempSrc:      SpO2: 99% 100% 99% 100%  Weight:      Height:        Constitutional/General: Morbidly obese,   intubated, sedated, not in any distress  Body mass index is 46.79 kg/m. Wt Readings from Last 3 Encounters:  07/18/16 135.5 kg (298 lb 11.6 oz)  02/10/16 130.5 kg (287 lb 12.8 oz)  01/27/16 (!) 138.7 kg (305 lb 11.2 oz)    HEENT: PERLA, anicteric sclerae. (-) Oral thrush. Intubated, ETT in place  Neck: No masses. Midline trachea. No JVD, (-) LAD. (-) bruits appreciated.  Respiratory/Chest: Grossly normal chest. (-) deformity. (-) Accessory muscle use.  Symmetric expansion. Diminished BS on both lower lung zones. (-) wheezing, rhonchi Crackles bibasilar (-) egophony  Cardiovascular: Regular rate and  rhythm, heart sounds normal, no murmur or gallops,  Trace peripheral edema  Gastrointestinal:  Normal bowel sounds. Soft, non-tender. No hepatosplenomegaly.  (-) masses.   Musculoskeletal:  Normal muscle tone.   Extremities: Grossly normal. (-) clubbing, cyanosis.  (-) edema  Skin: (-) rash,lesions seen.   Neurological/Psychiatric : sedated, intubated. CN grossly intact. (-) lateralizing signs.     CBC Recent Labs     07/16/16  1422  07/17/16  0252  07/18/16  0417  WBC  18.0*  19.0*  20.1*  HGB  9.8*  9.2*  8.9*  HCT  34.8*  31.0*  30.5*  PLT  345  361  323    Coag's No results for input(s): APTT, INR in the last 72 hours.  BMET Recent Labs     07/16/16  1422  07/17/16  0252  07/18/16  0417  NA  139  140  139  K  4.4  3.2*  3.5  CL  98*  100*  101  CO2  33*  28  29  BUN  <5*  5*  5*  CREATININE  0.42*  0.52  0.43*  GLUCOSE  112*  142*  108*    Electrolytes Recent Labs     07/16/16  1422   07/17/16  0252  07/17/16  0756  07/17/16  1551  07/18/16  0417  CALCIUM  8.9   --   8.7*   --    --   8.8*  MG   --    < >  1.9  1.9  2.0  2.2  PHOS   --    < >  2.3*  4.1  4.8*  5.0*   < > = values in this interval not displayed.    Sepsis Markers Recent Labs     07/16/16  2106  07/17/16  0252  07/18/16  0417  PROCALCITON  2.26  1.70  2.44     ABG Recent Labs     07/16/16  2232  07/17/16  0345  07/17/16  0943  PHART  7.591*  7.515*  7.518*  PCO2ART  34.1  36.3  38.7  PO2ART  64.0*  118*  59.0*    Liver Enzymes Recent Labs     07/16/16  1422  AST  18  ALT  19  ALKPHOS  86  BILITOT  0.5  ALBUMIN  3.2*    Cardiac Enzymes No results for input(s): TROPONINI, PROBNP in the last 72 hours.  Glucose Recent Labs     07/17/16  1219  07/17/16  1529  07/17/16  2005  07/17/16  2340  07/18/16  0352  07/18/16  0716  GLUCAP  126*  102*  120*  108*  107*  109*    Imaging Dg Chest Port 1 View  Result Date: 07/18/2016 CLINICAL DATA:  Acute on chronic respiratory failure with hypoxemia EXAM: PORTABLE CHEST 1 VIEW COMPARISON:  07/17/2016 FINDINGS: Endotracheal tube and NG tube remain in place, unchanged. There is cardiomegaly with vascular congestion. Bilateral lower lobe airspace opacities with probable bilateral effusions. No acute bony abnormality. IMPRESSION: Cardiomegaly with vascular congestion. Stable bibasilar atelectasis or infiltrates with small effusions. Electronically Signed   By: Rolm Baptise M.D.   On: 07/18/2016 07:11   Dg Chest Port 1 View  Result Date: 07/17/2016 CLINICAL DATA:  Respiratory failure secondary to COPD. Ventilator dependent. EXAM: PORTABLE CHEST 1 VIEW COMPARISON:  One-view chest x-ray 07/16/2016 FINDINGS: The heart size is exaggerated by low lung volumes. Moderate pulmonary vascular congestion is stable. Bibasilar airspace disease is slightly improved. Endotracheal tube now terminates 2.5 cm above the carina. The an NG tube courses off the inferior border of the film. IMPRESSION: 1. Persistent low lung volumes and moderate pulmonary vascular congestion. 2. Improving bilateral lower lobe airspace disease reflecting atelectasis or pneumonia. Electronically Signed   By: San Morelle M.D.   On: 07/17/2016 07:16   Dg Chest Portable 1 View  Result Date: 07/16/2016 CLINICAL DATA:  Intubated  EXAM: PORTABLE CHEST 1 VIEW COMPARISON:  07/16/2016 at 1406 hours FINDINGS: Endotracheal tube at/ just above the carina, although partially obscured by overlying leads/ wires. Cardiomegaly with pulmonary vascular congestion. No frank interstitial edema. Patchy right lower lobe opacity, suspicious for pneumonia. No definite pleural effusions. No pneumothorax. Enteric tube courses into the stomach. IMPRESSION: Endotracheal tube at/just above the carina, although partially obscured. Cardiomegaly with pulmonary vascular congestion. No frank interstitial edema. Patchy right lower lobe opacity, suspicious for pneumonia. Electronically Signed   By: Julian Hy M.D.   On: 07/16/2016 17:04   Dg Chest Port 1 View  Result Date: 07/16/2016 CLINICAL DATA:  Shortness of breath EXAM: PORTABLE CHEST 1 VIEW COMPARISON:  01/25/2016 FINDINGS: Limited rotated study.  Patient unable to help with positioning. Chronic cardiopericardial enlargement. Stable mediastinal contours when allowing for rotation. Bandlike opacities consistent with atelectasis. No convincing change from prior when allowing for differences in soft tissue positioning and obscuration. IMPRESSION: 1. Limited study due to patient factors, especially in evaluating the right base. 2. Patchy bilateral atelectasis or scarring, also seen previously.  Cannot exclude superimposed pneumonia at the bases. 3. Cardiomegaly without failure. Electronically Signed   By: Monte Fantasia M.D.   On: 07/16/2016 14:19   Dg Abd Portable 1v  Result Date: 07/17/2016 CLINICAL DATA:  Post NG tube adjustment EXAM: PORTABLE ABDOMEN - 1 VIEW COMPARISON:  07/17/2016 FINDINGS: Advancement of the NG tube with the tip in the distal stomach. No evidence of bowel obstruction or free air. IMPRESSION: Advancement of the NG tube with the tip likely in the distal stomach. Electronically Signed   By: Rolm Baptise M.D.   On: 07/17/2016 09:05   Dg Abd Portable 1v  Result Date: 07/17/2016 CLINICAL  DATA:  56 year old female with nasogastric tube placement. Initial encounter. EXAM: PORTABLE ABDOMEN - 1 VIEW COMPARISON:  07/17/2016 chest x-ray. FINDINGS: Nasogastric tube tip projects over the left lower abdomen which may be within dilated stomach. Paucity of bowel gas. The possibility of free intraperitoneal air cannot be assessed on a supine view. Basilar consolidation.  Cardiomegaly. IMPRESSION: Nasogastric tube tip projects over the left lower abdomen which may be within dilated stomach. Paucity of bowel gas. Electronically Signed   By: Genia Del M.D.   On: 07/17/2016 07:15    Assessment/Plan Acute on Chronic Hypoxemic Hypercapneic Resp Failure 2/2 CAP + Possible CHFpEF excaerbation/ pulmonary edema + OHS.  No evidence for sleep apnea based on sleep study done in 2017. - Needs a repeat ABG now. - If respiratory alkalosis is better, plan to diuresis. - Continue Rocephin and azithromycin for now. Cultures are pending. If she worsens clinically, we'll broaden antibiotics to Zosyn and vancomycin. - As mentioned, will need diuresis. Her EF now is 45-50 percent, lower than baseline.   CHFpEF exacerbation - needs diuresis  Best practice : heparin for DVT prophylaxis.  On PPI for SUP.   I spent  30  minutes of Critical Care time with this patient today. This is my time spent independent of the APP or resident.   Family : No family at bedside.    Monica Becton, MD 07/18/2016, 9:42 AM [facility] Pager [phone] After 3 pm or if no answer, call [phone]

## 2016-07-19 ENCOUNTER — Inpatient Hospital Stay (HOSPITAL_COMMUNITY): Payer: Medicaid Other

## 2016-07-19 LAB — BLOOD GAS, ARTERIAL
Acid-Base Excess: 6.3 mmol/L — ABNORMAL HIGH (ref 0.0–2.0)
Bicarbonate: 30.2 mmol/L — ABNORMAL HIGH (ref 20.0–28.0)
Drawn by: 44135
FIO2: 50
O2 SAT: 95.4 %
PEEP: 8 cmH2O
Patient temperature: 98.6
RATE: 18 resp/min
VT: 550 mL
pCO2 arterial: 42.8 mmHg (ref 32.0–48.0)
pH, Arterial: 7.462 — ABNORMAL HIGH (ref 7.350–7.450)
pO2, Arterial: 80.2 mmHg — ABNORMAL LOW (ref 83.0–108.0)

## 2016-07-19 LAB — TROPONIN I
Troponin I: <0.03 ng/mL (ref <0.03)
Troponin I: 0.03 ng/mL (ref <0.03)

## 2016-07-19 LAB — CBC
HCT: 31.7 % — ABNORMAL LOW (ref 36.0–46.0)
HEMOGLOBIN: 9.5 g/dL — AB (ref 12.0–15.0)
MCH: 24.9 pg — ABNORMAL LOW (ref 26.0–34.0)
MCHC: 30 g/dL (ref 30.0–36.0)
MCV: 83.2 fL (ref 78.0–100.0)
Platelets: 334 10*3/uL (ref 150–400)
RBC: 3.81 MIL/uL — AB (ref 3.87–5.11)
RDW: 17.8 % — ABNORMAL HIGH (ref 11.5–15.5)
WBC: 16.7 10*3/uL — AB (ref 4.0–10.5)

## 2016-07-19 LAB — BASIC METABOLIC PANEL
Anion gap: 14 (ref 5–15)
BUN: 11 mg/dL (ref 6–20)
CHLORIDE: 100 mmol/L — AB (ref 101–111)
CO2: 28 mmol/L (ref 22–32)
CREATININE: 0.44 mg/dL (ref 0.44–1.00)
Calcium: 8.8 mg/dL — ABNORMAL LOW (ref 8.9–10.3)
GFR calc Af Amer: >60 mL/min (ref >60)
GFR calc non Af Amer: >60 mL/min (ref >60)
Glucose, Bld: 106 mg/dL — ABNORMAL HIGH (ref 65–99)
Potassium: 3.9 mmol/L (ref 3.5–5.1)
Sodium: 142 mmol/L (ref 135–145)

## 2016-07-19 LAB — GLUCOSE, CAPILLARY
GLUCOSE-CAPILLARY: 110 mg/dL — AB (ref 65–99)
GLUCOSE-CAPILLARY: 114 mg/dL — AB (ref 65–99)
GLUCOSE-CAPILLARY: 145 mg/dL — AB (ref 65–99)
GLUCOSE-CAPILLARY: 154 mg/dL — AB (ref 65–99)
GLUCOSE-CAPILLARY: 156 mg/dL — AB (ref 65–99)
Glucose-Capillary: 144 mg/dL — ABNORMAL HIGH (ref 65–99)

## 2016-07-19 LAB — MAGNESIUM: Magnesium: 2 mg/dL (ref 1.7–2.4)

## 2016-07-19 LAB — PHOSPHORUS: PHOSPHORUS: 5 mg/dL — AB (ref 2.5–4.6)

## 2016-07-19 LAB — TRIGLYCERIDES: TRIGLYCERIDES: 234 mg/dL — AB (ref <150)

## 2016-07-19 LAB — PROCALCITONIN: PROCALCITONIN: 2.79 ng/mL

## 2016-07-19 MED ORDER — FUROSEMIDE 10 MG/ML IJ SOLN
20.0000 mg | Freq: Two times a day (BID) | INTRAMUSCULAR | Status: DC
  Administered 2016-07-19 – 2016-07-24 (×11): 20 mg via INTRAVENOUS
  Filled 2016-07-19 (×11): qty 2

## 2016-07-19 MED ORDER — CHLORHEXIDINE GLUCONATE 0.12 % MT SOLN
OROMUCOSAL | Status: AC
  Filled 2016-07-19: qty 15

## 2016-07-19 MED ORDER — METHYLPREDNISOLONE SODIUM SUCC 40 MG IJ SOLR
40.0000 mg | Freq: Two times a day (BID) | INTRAMUSCULAR | Status: DC
  Administered 2016-07-19: 40 mg via INTRAVENOUS
  Filled 2016-07-19: qty 1

## 2016-07-19 MED ORDER — IPRATROPIUM-ALBUTEROL 0.5-2.5 (3) MG/3ML IN SOLN
3.0000 mL | Freq: Four times a day (QID) | RESPIRATORY_TRACT | Status: DC
  Administered 2016-07-19 – 2016-07-22 (×13): 3 mL via RESPIRATORY_TRACT
  Filled 2016-07-19 (×16): qty 3

## 2016-07-19 MED ORDER — IPRATROPIUM-ALBUTEROL 0.5-2.5 (3) MG/3ML IN SOLN
3.0000 mL | Freq: Three times a day (TID) | RESPIRATORY_TRACT | Status: DC
  Administered 2016-07-19: 3 mL via RESPIRATORY_TRACT
  Filled 2016-07-19: qty 3

## 2016-07-19 MED ORDER — SODIUM CHLORIDE 0.9 % IV SOLN: 4.0000 mg | Freq: Four times a day (QID) | INTRAVENOUS | Status: DC

## 2016-07-19 MED ORDER — DEXAMETHASONE SODIUM PHOSPHATE 4 MG/ML IJ SOLN
4.0000 mg | Freq: Four times a day (QID) | INTRAMUSCULAR | Status: AC
  Administered 2016-07-19 – 2016-07-20 (×4): 4 mg via INTRAVENOUS
  Filled 2016-07-19 (×4): qty 1

## 2016-07-19 MED ORDER — ARFORMOTEROL TARTRATE 15 MCG/2ML IN NEBU
15.0000 ug | INHALATION_SOLUTION | Freq: Two times a day (BID) | RESPIRATORY_TRACT | Status: DC
  Administered 2016-07-19 – 2016-07-24 (×10): 15 ug via RESPIRATORY_TRACT
  Filled 2016-07-19 (×12): qty 2

## 2016-07-19 MED ORDER — MIRTAZAPINE 15 MG PO TABS
15.0000 mg | ORAL_TABLET | Freq: Every day | ORAL | Status: DC
  Administered 2016-07-19 – 2016-07-23 (×5): 15 mg via ORAL
  Filled 2016-07-19 (×5): qty 1

## 2016-07-19 MED ORDER — CLONAZEPAM 0.125 MG PO TBDP
1.0000 mg | ORAL_TABLET | Freq: Two times a day (BID) | ORAL | Status: DC
Start: 2016-07-19 — End: 2016-07-22
  Administered 2016-07-19 – 2016-07-22 (×7): 1 mg via ORAL
  Filled 2016-07-19: qty 2
  Filled 2016-07-19 (×2): qty 8
  Filled 2016-07-19: qty 2
  Filled 2016-07-19: qty 8
  Filled 2016-07-19 (×4): qty 2

## 2016-07-19 NOTE — Progress Notes (Addendum)
PULMONARY / CRITICAL CARE MEDICINE   Name: Michele Owens MRN: [id number] DOB: 03/01/1961    ADMISSION DATE:  07/16/2016 CONSULTATION DATE:  07/16/2016  REFERRING MD:  Dr. Jeanell Sparrow, EDP  CHIEF COMPLAINT:  Dyspnea   Brief Patient Summary:   56 year old female active smoker with PMH of asthma, COPD (on 2L [place] at home, last seen by Dr. Halford Chessman 08/2015 for sleep study), Diastolic HF, DM, GERD, and HTN. Presents to ED 4/8 with reported one day history of dyspnea, wheezing, productive cough, and BLE edema. Upon arrival to ED patient was lethargic however, arousable and responsive to questions. CXR revealed right lower lobe opacity, WBC 18, and temp 100.2. ABG 7.320/75.7/58, patient was placed on BIPAP but ultimately required intubation for continued somnolence. PCCM asked to admit.    SUBJECTIVE:  Sedation issues. Tries to get  Out of bed and pull her ETT. Lasix made BP drop a little bit >> they had to decrease the dose which she tolerated.  Tolerating TF  VITAL SIGNS: BP (!) 106/58   Pulse 73   Temp 98.7 F (37.1 C) (Axillary)   Resp 18   Ht 5\' 7"  (1.702 m)   Wt 133.3 kg (293 lb 14 oz)   SpO2 97%   BMI 46.03 kg/m   HEMODYNAMICS:    VENTILATOR SETTINGS: Vent Mode: PRVC FiO2 (%):  [50 %-100 %] 50 % Set Rate:  [18 bmp] 18 bmp Vt Set:  [450 mL-550 mL] 550 mL PEEP:  [5 cmH20-12 cmH20] 8 cmH20 Plateau Pressure:  [21 cmH20-25 cmH20] 21 cmH20  INTAKE / OUTPUT: I/O last 3 completed shifts: In: 4641.5 [I.V.:3577.8; NG/GT:413.7; IV Piggyback:650] Out: 5284 [Urine:6250]  PHYSICAL EXAMINATION: General:  Adult obese female sedate on MV, no distress HEENT: MM pink/moist, ETT Neuro: sedated, awakens to voice, follows simple commands, MAE CV: distant s1s2 rrr, no m/r/g PULM: even/non-labored, lungs bilaterally coarse, she was clear yesterday but with wheezing now in upper lung zones.  XL:KGMW, non-tender, bsx4 active, obese Extremities: warm/dry, gr 1 edema  Skin: no rashes or  lesions  LABS:  BMET  Recent Labs Lab 07/17/16 0252 07/18/16 0417 07/19/16 0240  NA 140 139 142  K 3.2* 3.5 3.9  CL 100* 101 100*  CO2 28 29 28   BUN 5* 5* 11  CREATININE 0.52 0.43* 0.44  GLUCOSE 142* 108* 106*    Electrolytes  Recent Labs Lab 07/17/16 0252  07/18/16 0417 07/18/16 1633 07/19/16 0240  CALCIUM 8.7*  --  8.8*  --  8.8*  MG 1.9  < > 2.2 2.0 2.0  PHOS 2.3*  < > 5.0* 4.2 5.0*  < > = values in this interval not displayed.  CBC  Recent Labs Lab 07/17/16 0252 07/18/16 0417 07/19/16 0240  WBC 19.0* 20.1* 16.7*  HGB 9.2* 8.9* 9.5*  HCT 31.0* 30.5* 31.7*  PLT 361 323 334    Coag's No results for input(s): APTT, INR in the last 168 hours.  Sepsis Markers  Recent Labs Lab 07/16/16 2106 07/17/16 0003 07/17/16 0252 07/18/16 0417 07/19/16 0240  LATICACIDVEN 2.9* 3.3* 2.3*  --   --   PROCALCITON 2.26  --  1.70 2.44 2.79    ABG  Recent Labs Lab 07/17/16 0943 07/18/16 1030 07/19/16 0430  PHART 7.518* 7.312* 7.462*  PCO2ART 38.7 59.7* 42.8  PO2ART 59.0* 103 80.2*    Liver Enzymes  Recent Labs Lab 07/16/16 1422  AST 18  ALT 19  ALKPHOS 86  BILITOT 0.5  ALBUMIN 3.2*  Cardiac Enzymes No results for input(s): TROPONINI, PROBNP in the last 168 hours.  Glucose  Recent Labs Lab 07/18/16 0716 07/18/16 1140 07/18/16 1533 07/18/16 2003 07/18/16 2314 07/19/16 0328  GLUCAP 109* 95 118* 123* 103* 110*    Imaging No results found.   STUDIES:  CXR 4/8 > Right lower lobe opacity, suspicious for PNA, cardiomegaly with vascular congestion ECHO 4/8 >> LVEF 46-56%, grade 2 diastolic dysfunction, mild TR  CULTURES: MRSA PCR 4/9 >> negative Blood 4/8 >> (-) Sputum 4/8 >> (-) RVP 4/8 >> negative Strep p. 4/8 >> negative Legionella 4/8 >> negative  ANTIBIOTICS: Zosyn 4/8 in ED Vancomycin 4/8 in ED Azithromycin 4/8 >> Rocephin 4/8 >>  SIGNIFICANT EVENTS: 4/8 > Presents to ED with dyspnea > intubated   LINES/TUBES: ETT  4/8 >>  DISCUSSION: 56 year old female with COPD/Asthma on home oxygen presents with dyspnea, sputum production, febrile, and WBC 18. Intubated in ED. ABG with hypoxia/hypercarbia. CXR concerning for Right lower lobe PNA.   ASSESSMENT / PLAN:  PULMONARY A: Acute on Chronic hypoxemic/hypercarbic respiratory failure 2/2 Asthma/COPD exacerbation + Pulmonary edema + CAP. Concern for demand ischemia give echo findings of diffuse hypokinesis and EF 45%. Tobacco Abuse Asthma/possible COPD on home O2 2L P:   PRVC 11ml/kg. Fio2 is better.  She is wheezing today. Try PST but unlikely we can extubate.  Start IV steroids Cont pulmicort. Add brovana, scheduled duoneb Cont abx. Cont diuresis. Check troponin Keep sedated.  Continue Duoneb and Pulmicort   CARDIOVASCULAR A:  Pulm edema Concern for demand ischemia 2/2 echo findings of diffuse hypokinesis and EF now 45% from previous of 55-60% H/O HTN, HLD, Diastolic HF P:  ICU Monitoring  diuresce Cycle troponin Hold home Coreg, lisinopril Continue home Pravastatin    RENAL A:   Pulm edema P:   diuresce Trend BMP Replace electrolytes as needed    GASTROINTESTINAL A:   H/O GERD P:   Continue TF Continue PPI for SUP   HEMATOLOGIC A:   Anemia  P:  Trend CBC Transfuse for Hbg <7 Heparin SQ/SCD for VTE   INFECTIOUS A:   Leukocytosis in setting of presumed CAP/RLL infiltrate  P:   Follow Cultures Continue Azithromycin and Rocephin RVP, Urine Leg and Strep p negative Trend fever/WBC curve   ENDOCRINE A:   H/O DM   P:   SSI Q4 CBGs Hold home metformin   NEUROLOGIC A:   Encephalopathy in setting of hypercarbia  H/O Anxiety  P:   RASS goal: 0/-1 On propofol drip, Fentanyl gtt and versed pushes. Gets agitated intermittently. Will re-start home dose remeron and clonazepam.  Not sure what clonazepam dose is at home so will start her at a lower dose.  If she remains agitated, we can try precedex drip.  I am  avoiding versed  Drip since it will make it harder for [place] to wean her.     FAMILY  - Updates: Family daughter updated at bedside.   - Inter-disciplinary family meet or Palliative Care meeting due by:  4/15  I spent  30  minutes of Critical Care time with this patient today.     Monica Becton, MD 07/19/2016, 7:27 AM [facility] Pager [phone] After 3 pm or if no answer, call [phone]

## 2016-07-19 NOTE — Progress Notes (Signed)
   [facility] PCCM  > pt has been doing well on PST 16/8 >> will try 13/8.  If she does well, plan to extubate to bipap for 1-2 hrs then wean off.  Will keep Bipap at HS and prn thereafter.  > plan d/w RT and pt.    Monica Becton, MD 07/19/2016, 1:35 PM [facility] Pager [phone] After 3 pm or if no answer, call [phone]

## 2016-07-19 NOTE — Progress Notes (Signed)
   [facility] PCCM > pt with no cuff leak.  She was started on steroids today. > plan to extubate in am.    Mayer, MD 07/19/2016, 3:05 PM [facility] Pager [phone] After 3 pm or if no answer, call [phone]

## 2016-07-20 ENCOUNTER — Inpatient Hospital Stay (HOSPITAL_COMMUNITY): Payer: Medicaid Other

## 2016-07-20 DIAGNOSIS — J9601 Acute respiratory failure with hypoxia: Secondary | ICD-10-CM

## 2016-07-20 LAB — BASIC METABOLIC PANEL
Anion gap: 10 (ref 5–15)
BUN: 14 mg/dL (ref 6–20)
CALCIUM: 9.2 mg/dL (ref 8.9–10.3)
CO2: 29 mmol/L (ref 22–32)
Chloride: 102 mmol/L (ref 101–111)
Creatinine, Ser: 0.47 mg/dL (ref 0.44–1.00)
GFR calc non Af Amer: >60 mL/min (ref >60)
Glucose, Bld: 142 mg/dL — ABNORMAL HIGH (ref 65–99)
Potassium: 3.6 mmol/L (ref 3.5–5.1)
SODIUM: 141 mmol/L (ref 135–145)

## 2016-07-20 LAB — PROCALCITONIN: PROCALCITONIN: 1.83 ng/mL

## 2016-07-20 LAB — GLUCOSE, CAPILLARY
GLUCOSE-CAPILLARY: 135 mg/dL — AB (ref 65–99)
Glucose-Capillary: 138 mg/dL — ABNORMAL HIGH (ref 65–99)
Glucose-Capillary: 146 mg/dL — ABNORMAL HIGH (ref 65–99)
Glucose-Capillary: 122 mg/dL — ABNORMAL HIGH (ref 65–99)
Glucose-Capillary: 153 mg/dL — ABNORMAL HIGH (ref 65–99)
Glucose-Capillary: 131 mg/dL — ABNORMAL HIGH (ref 65–99)

## 2016-07-20 LAB — CBC
HCT: 32 % — ABNORMAL LOW (ref 36.0–46.0)
Hemoglobin: 9.5 g/dL — ABNORMAL LOW (ref 12.0–15.0)
MCH: 24.4 pg — AB (ref 26.0–34.0)
MCHC: 29.7 g/dL — ABNORMAL LOW (ref 30.0–36.0)
MCV: 82.3 fL (ref 78.0–100.0)
Platelets: 391 10*3/uL (ref 150–400)
RBC: 3.89 MIL/uL (ref 3.87–5.11)
RDW: 16.9 % — AB (ref 11.5–15.5)
WBC: 23 10*3/uL — ABNORMAL HIGH (ref 4.0–10.5)

## 2016-07-20 LAB — MAGNESIUM: MAGNESIUM: 2.4 mg/dL (ref 1.7–2.4)

## 2016-07-20 LAB — PHOSPHORUS: PHOSPHORUS: 3.6 mg/dL (ref 2.5–4.6)

## 2016-07-20 MED ORDER — BUDESONIDE 0.5 MG/2ML IN SUSP
0.5000 mg | Freq: Two times a day (BID) | RESPIRATORY_TRACT | Status: DC
  Administered 2016-07-20 – 2016-07-24 (×7): 0.5 mg via RESPIRATORY_TRACT
  Filled 2016-07-20 (×9): qty 2

## 2016-07-20 MED ORDER — INSULIN ASPART 100 UNIT/ML ~~LOC~~ SOLN
0.0000 [IU] | Freq: Three times a day (TID) | SUBCUTANEOUS | Status: DC
  Administered 2016-07-23: 4 [IU] via SUBCUTANEOUS

## 2016-07-20 MED ORDER — ORAL CARE MOUTH RINSE
15.0000 mL | Freq: Two times a day (BID) | OROMUCOSAL | Status: DC
  Administered 2016-07-20 – 2016-07-24 (×8): 15 mL via OROMUCOSAL

## 2016-07-20 MED ORDER — POTASSIUM CHLORIDE 20 MEQ/15ML (10%) PO SOLN
40.0000 meq | Freq: Two times a day (BID) | ORAL | Status: DC
  Administered 2016-07-20: 40 meq
  Filled 2016-07-20: qty 30

## 2016-07-20 MED ORDER — PRAVASTATIN SODIUM 40 MG PO TABS
40.0000 mg | ORAL_TABLET | Freq: Every day | ORAL | Status: DC
  Administered 2016-07-21 – 2016-07-23 (×3): 40 mg via ORAL
  Filled 2016-07-20 (×3): qty 1

## 2016-07-20 MED ORDER — POTASSIUM CHLORIDE 20 MEQ/15ML (10%) PO SOLN
40.0000 meq | Freq: Two times a day (BID) | ORAL | Status: DC
  Administered 2016-07-20 – 2016-07-24 (×8): 40 meq via ORAL
  Filled 2016-07-20 (×8): qty 30

## 2016-07-20 MED ORDER — INSULIN ASPART 100 UNIT/ML ~~LOC~~ SOLN: 0.0000 [IU] | Freq: Every day | SUBCUTANEOUS | Status: DC

## 2016-07-20 MED ORDER — PHENOL 1.4 % MT LIQD
1.0000 | OROMUCOSAL | Status: DC | PRN
  Administered 2016-07-20: 1 via OROMUCOSAL
  Filled 2016-07-20: qty 177

## 2016-07-20 MED ORDER — PANTOPRAZOLE SODIUM 40 MG PO TBEC
40.0000 mg | DELAYED_RELEASE_TABLET | Freq: Every day | ORAL | Status: DC
  Administered 2016-07-20 – 2016-07-24 (×5): 40 mg via ORAL
  Filled 2016-07-20 (×5): qty 1

## 2016-07-20 NOTE — Progress Notes (Signed)
PULMONARY / CRITICAL CARE MEDICINE   Name: Michele Owens MRN: [id number] DOB: 12-28-1960    ADMISSION DATE:  07/16/2016 CONSULTATION DATE:  07/16/2016  REFERRING MD:  Dr. Jeanell Sparrow, EDP  CHIEF COMPLAINT:  Dyspnea   Brief Patient Summary:   56 year old female active smoker with PMH of asthma, COPD (on 2L [place] at home, last seen by Dr. Halford Chessman 08/2015 for sleep study), Diastolic HF, DM, GERD, and HTN. Presents to ED 4/8 with reported one day history of dyspnea, wheezing, productive cough, and BLE edema. Upon arrival to ED patient was lethargic however, arousable and responsive to questions. CXR revealed right lower lobe opacity, WBC 18, and temp 100.2. ABG 7.320/75.7/58, patient was placed on BIPAP but ultimately required intubation for continued somnolence. PCCM asked to admit.    SUBJECTIVE:  Weaned well yesterday but had no cuff leak.  Steroids started 4/11.  Patient wants ETT out, otherwise without complaints.  No events overnight per RN.  Diuresed -2864/24h   VITAL SIGNS: BP 135/70   Pulse 79   Temp 98.4 F (36.9 C) (Oral)   Resp 18   Ht 5\' 7"  (1.702 m)   Wt 285 lb 7.9 oz (129.5 kg)   SpO2 95%   BMI 44.71 kg/m   HEMODYNAMICS:    VENTILATOR SETTINGS: Vent Mode: PRVC FiO2 (%):  [40 %] 40 % Set Rate:  [18 bmp] 18 bmp Vt Set:  [550 mL] 550 mL PEEP:  [8 cmH20] 8 cmH20 Pressure Support:  [5 cmH20-12 cmH20] 5 cmH20 Plateau Pressure:  [22 WCB76-28 cmH20] 24 cmH20  INTAKE / OUTPUT: I/O last 3 completed shifts: In: 2751.3 [I.V.:1747.6; NG/GT:403.7; IV Piggyback:600] Out: 5250 [Urine:5250]  PHYSICAL EXAMINATION: General:  Adult obese female sitting up in bed, no distress - communicating by writing on paper HEENT: MM pink/moist, ETT PSY: Mildly anxious, wanting to talk Neuro: Alert, follows commands, MAE CV: s1s2 rrr, no m/r/g PULM: even/non-labored, lungs bilaterally clear anteriorly, diminished posteriorly in the bases L> R w/faint wheeze GI: obese, soft, non-tender, bsx4 active   Extremities: warm/dry, no LE edema  Skin: no rashes or lesions  LABS:  BMET  Recent Labs Lab 07/18/16 0417 07/19/16 0240 07/20/16 0219  NA 139 142 141  K 3.5 3.9 3.6  CL 101 100* 102  CO2 29 28 29   BUN 5* 11 14  CREATININE 0.43* 0.44 0.47  GLUCOSE 108* 106* 142*    Electrolytes  Recent Labs Lab 07/18/16 0417 07/18/16 1633 07/19/16 0240 07/20/16 0219  CALCIUM 8.8*  --  8.8* 9.2  MG 2.2 2.0 2.0 2.4  PHOS 5.0* 4.2 5.0* 3.6    CBC  Recent Labs Lab 07/18/16 0417 07/19/16 0240 07/20/16 0219  WBC 20.1* 16.7* 23.0*  HGB 8.9* 9.5* 9.5*  HCT 30.5* 31.7* 32.0*  PLT 323 334 391    Coag's No results for input(s): APTT, INR in the last 168 hours.  Sepsis Markers  Recent Labs Lab 07/16/16 2106 07/17/16 0003 07/17/16 0252 07/18/16 0417 07/19/16 0240 07/20/16 0219  LATICACIDVEN 2.9* 3.3* 2.3*  --   --   --   PROCALCITON 2.26  --  1.70 2.44 2.79 1.83    ABG  Recent Labs Lab 07/17/16 0943 07/18/16 1030 07/19/16 0430  PHART 7.518* 7.312* 7.462*  PCO2ART 38.7 59.7* 42.8  PO2ART 59.0* 103 80.2*    Liver Enzymes  Recent Labs Lab 07/16/16 1422  AST 18  ALT 19  ALKPHOS 86  BILITOT 0.5  ALBUMIN 3.2*    Cardiac Enzymes  Recent  Labs Lab 07/19/16 0807 07/19/16 1311 07/19/16 1920  TROPONINI <0.03 <0.03 0.03*    Glucose  Recent Labs Lab 07/19/16 0807 07/19/16 1146 07/19/16 1559 07/19/16 1929 07/19/16 2333 07/20/16 0340  GLUCAP 114* 145* 144* 154* 156* 138*    Imaging No results found.   STUDIES:  CXR 4/8 > Right lower lobe opacity, suspicious for PNA, cardiomegaly with vascular congestion ECHO 4/8 >> LVEF 23-53%, grade 2 diastolic dysfunction, mild TR  CULTURES: MRSA PCR 4/9 >> negative Blood 4/8 >> (-) Sputum 4/8 >> (-) RVP 4/8 >> negative Strep p. 4/8 >> negative Legionella 4/8 >> negative  ANTIBIOTICS: Zosyn 4/8 in ED Vancomycin 4/8 in ED Azithromycin 4/8 >>4/12 Rocephin 4/8 >>  SIGNIFICANT EVENTS: 4/8 >  Presents to ED with dyspnea > intubated  4/11 > Weaning; no cuff leak  LINES/TUBES: ETT 4/8 >>  DISCUSSION: 56 year old female with COPD/Asthma on home oxygen presents with dyspnea, sputum production, febrile, and WBC 18. Intubated in ED. ABG with hypoxia/hypercarbia. CXR concerning for Right lower lobe PNA.   ASSESSMENT / PLAN:  PULMONARY A: Acute on Chronic hypoxemic/hypercarbic respiratory failure 2/2 Asthma/COPD exacerbation + Pulmonary edema + CAP. Concern for demand ischemia give echo findings of diffuse hypokinesis and EF 45%. Tobacco Abuse Asthma/possible COPD on home O2 2L No cuff leak  Hx - sleep study 5/17 neg for OSA, +OHS, recs for 2L [place] while sleeping P:   PRVC 36m/kg Wean Fi02/ peep as tolerated- improving VAP measures SBT as tolerated - hopeful to extubate today if + cuff leak, possible to extubate to Bipap  Continue decadron Increase pulmicort to 0.5 Continue brovana and scheduled duonebs  Albuterol prn  Zithromax complete d/5 4/12 Stop date for rocephin 4/14 PCXR now Continue lasix   CARDIOVASCULAR A:  Pulm edema Concern for demand ischemia 2/2 echo findings of diffuse hypokinesis and EF now 45% from previous of 55-60% H/O HTN, HLD, Diastolic HF P:  ICU monitoring  Diuresing troponins neg x 3 Continue home pravastatin Hold home coreg, lisinopril   RENAL A:   Pulm edema P:   Lasix 20mg  BID KCL 40 meq x 2 Trend BMP Replace electrolytes as needed   GASTROINTESTINAL A:   H/O GERD P:   Continue TF Continue PPI for SUP  HEMATOLOGIC A:   Anemia  P:  Trend CBC Transfuse for Hbg <7  Heparin SQ/ SCD for VTE prophalaxis  INFECTIOUS A:   Leukocytosis in setting of presumed CAP/RLL infiltrate - slight bump on 4/12 most likely r/t steroids, remains afebrile P:   Cultures negative PCT down Trend fever / WBC curve Abx as above  ENDOCRINE A:   H/O DM   P:   SSI CBGs Q4 Hold home metformin  NEUROLOGIC A:   Encephalopathy in  setting of hypercarbia  H/O Anxiety  P:   RASS goal: 0 Minimize sedation Continue home remeron and clonzepam Consider precedex if agitation continues RASS goal: 0/-1   FAMILY  - Updates: Patient and daughter at bedside per Dr. Corrie Dandy  - Inter-disciplinary family meet or Palliative Care meeting due by:  4/15  I spent  30  minutes of Critical Care time with this patient today.    Kennieth Rad, AGACNP-BC [facility] Pgr: [phone] or if no answer [phone] 07/20/2016, 7:43 AM    ATTENDING NOTE / ATTESTATION NOTE :   I have discussed the case with the resident/APP  Kennieth Rad.   I agree with the resident/APP's  history, physical examination,  assessment, and plans.    I have edited the above note and modified it according to our agreed history, physical examination, assessment and plan.   Briefly, 56 year old female active smoker with PMH of asthma, COPD (on 2L  at home, last seen by Dr. Halford Chessman 08/2015 for sleep study), Diastolic HF, DM, GERD, and HTN. Presents to ED 4/8 with reported one day history of dyspnea, wheezing, productive cough, and BLE edema. Upon arrival to ED patient was lethargic however, arousable and responsive to questions. CXR revealed right lower lobe opacity, WBC 18, and temp 100.2. ABG 7.320/75.7/58, patient was placed on BIPAP but ultimately required intubation for continued somnolence. PCCM asked to admit.   She weaned well on 4/11 but did not have a cough leak.  [place] well.  Doing great on PST.    Vitals:  Vitals:   07/20/16 0800 07/20/16 0849 07/20/16 0850 07/20/16 0853  BP:      Pulse:      Resp:      Temp: 97.8 F (36.6 C)     TempSrc: Axillary     SpO2:  96% 97% 96%  Weight:      Height:        Constitutional/General: well-nourished, well-developed, intubated,  not in any distress  Body mass index is 44.71 kg/m. Wt Readings from Last 3 Encounters:  07/20/16 129.5 kg (285 lb 7.9 oz)  02/10/16 130.5 kg  (287 lb 12.8 oz)  01/27/16 (!) 138.7 kg (305 lb 11.2 oz)    HEENT: PERLA, anicteric sclerae. (-) Oral thrush. Intubated, ETT in place  Neck: No masses. Midline trachea. No JVD, (-) LAD. (-) bruits appreciated.  Respiratory/Chest: Grossly normal chest. (-) deformity. (-) Accessory muscle use.  Symmetric expansion. Diminished BS on both lower lung zones. Less crackles, (-) rhonchi Significantly less wheeze (-) egophony  Cardiovascular: Regular rate and  rhythm, heart sounds normal, no murmur or gallops,  Gr 1  peripheral edema  Gastrointestinal:  Normal bowel sounds. Soft, non-tender. No hepatosplenomegaly.  (-) masses.   Musculoskeletal:  Normal muscle tone.   Extremities: Grossly normal. (-) clubbing, cyanosis.  (-) edema  Skin: (-) rash,lesions seen.   Neurological/Psychiatric : sedated, intubated. CN grossly intact. (-) lateralizing signs.     CBC Recent Labs     07/18/16  0417  07/19/16  0240  07/20/16  0219  WBC  20.1*  16.7*  23.0*  HGB  8.9*  9.5*  9.5*  HCT  30.5*  31.7*  32.0*  PLT  323  334  391    Coag's No results for input(s): APTT, INR in the last 72 hours.  BMET Recent Labs     07/18/16  0417  07/19/16  0240  07/20/16  0219  NA  139  142  141  K  3.5  3.9  3.6  CL  101  100*  102  CO2  29  28  29   BUN  5*  11  14  CREATININE  0.43*  0.44  0.47  GLUCOSE  108*  106*  142*    Electrolytes Recent Labs     07/18/16  0417  07/18/16  1633  07/19/16  0240  07/20/16  0219  CALCIUM  8.8*   --   8.8*  9.2  MG  2.2  2.0  2.0  2.4  PHOS  5.0*  4.2  5.0*  3.6    Sepsis Markers Recent Labs     07/18/16  0417  07/19/16  0240  07/20/16  0219  PROCALCITON  2.44  2.79  1.83    ABG Recent Labs     07/17/16  0943  07/18/16  1030  07/19/16  0430  PHART  7.518*  7.312*  7.462*  PCO2ART  38.7  59.7*  42.8  PO2ART  59.0*  103  80.2*    Liver Enzymes No results for input(s): AST, ALT, ALKPHOS, BILITOT, ALBUMIN in the last 72  hours.  Cardiac Enzymes Recent Labs     07/19/16  0807  07/19/16  1311  07/19/16  1920  TROPONINI  <0.03  <0.03  0.03*    Glucose Recent Labs     07/19/16  1146  07/19/16  1559  07/19/16  1929  07/19/16  2333  07/20/16  0340  07/20/16  0745  GLUCAP  145*  144*  154*  156*  138*  146*    Imaging Dg Chest Port 1 View  Result Date: 07/20/2016 CLINICAL DATA:  Respiratory failure.  Ventilator support. EXAM: PORTABLE CHEST 1 VIEW COMPARISON:  07/19/2016 FINDINGS: Chronic enlargement of the cardiac silhouette. Endotracheal tube tip 1 cm above the carina. Nasogastric tube enters the abdomen. Persistent atelectasis/ infiltrate in both lower lobes with small effusions. No change since yesterday. IMPRESSION: No change. Persistent infiltrate/ atelectasis in both lower lungs with associated effusions. Electronically Signed   By: Nelson Chimes M.D.   On: 07/20/2016 08:26   Dg Chest Port 1 View  Result Date: 07/19/2016 CLINICAL DATA:  Hypoxia EXAM: PORTABLE CHEST 1 VIEW COMPARISON:  July 18, 2016 FINDINGS: Endotracheal tube tip is 1.2 cm above the carina. Nasogastric tube tip and side port are below the diaphragm. No pneumothorax is evident. There are small pleural effusions bilaterally with patchy opacity in the lung bases, likely combination of atelectasis and infiltrate. There is mild linear atelectasis in the left mid lower lung zones and right base regions as well. No appreciable edema. There is cardiomegaly with pulmonary venous hypertension. IMPRESSION: Tube positions as described without pneumothorax. Endotracheal tube is close to the carina and should be withdrawn approximately 2-3 cm. Evidence of a degree of congestive heart failure. Question superimposed pneumonia in the bases. There is atelectasis in the bases, and the opacities in the bases could be due to edema and atelectasis as opposed to pneumonia. Particular attention to these areas on subsequent evaluations warranted. Electronically  Signed   By: Lowella Grip III M.D.   On: 07/19/2016 07:48     Assessment/Plan Acute on Chronic Hypoxemic Hypercapneic Resp Failure 2/2 CAP + CHFpEF excaerbation/ pulmonary edema + OHS.  No evidence for sleep apnea based on sleep study done in 2017. - Doing well on PST. No cuff leak on 4/11 and started on decadron. Anticipate we will extubate today.  D/w pt and daughter re: high risk of re intubation 2/2 no cuff leak.  They understand and are OK with proceeding with extubation - cont diuresis - cont Rocephin (plan for 7 days), finish 5 days of zpak - Bipap prn during daytime and bipap at HS for now >> wean off   Anxiety - cont remeron and clonazepam for now   CHFrEF exacerbation - cont diuresis   I spent  30  minutes of Critical Care time with this patient today. This is my time spent independent of the APP or resident.   Family :Family updated at length today.  Pt and daughter updated at bedside.    Monica Becton, MD 07/20/2016, 9:01 AM  [facility] Pager [phone] After 3 pm or if no answer, call [phone]

## 2016-07-20 NOTE — Progress Notes (Signed)
eLink Physician-Brief Progress Note Patient Name: Michele Owens DOB: 05-27-1960 MRN: [id number]   Date of Service  07/20/2016  HPI/Events of Note  Patient extubated today. Doing well. Tolerating diet. Talking to brother who is present in the room.   eICU Interventions  1. Sedation and analgesia discontinued 2. Medications switched to oral formulation 3. Switched insulin to Accu-Cheks every before meals & at bedtime with resistance sliding scale insulin algorithm      Intervention Category Major Interventions: Respiratory failure - evaluation and management  Tera Partridge 07/20/2016, 9:13 PM

## 2016-07-20 NOTE — Procedures (Signed)
Extubation Procedure Note  Patient Details:   Name: Michele Owens DOB: Feb 13, 1961 MRN: [id number]   Airway Documentation:     Evaluation  O2 sats: stable throughout Complications: No apparent complications Patient did tolerate procedure well. Bilateral Breath Sounds: Clear, Diminished   Yes  Patient tolerated wean. MD ordered to extubate. No cuff leak noted. MD aware. MD ordered to extubate to BiPAP. Patient placed on BiPAP. No stridor or dyspnea noted. RN at bedside. Will continue to monitor.  Myrtie Neither 07/20/2016, 11:48 AM

## 2016-07-21 ENCOUNTER — Inpatient Hospital Stay (HOSPITAL_COMMUNITY): Payer: Medicaid Other

## 2016-07-21 DIAGNOSIS — J810 Acute pulmonary edema: Secondary | ICD-10-CM

## 2016-07-21 LAB — BASIC METABOLIC PANEL
ANION GAP: 7 (ref 5–15)
BUN: 12 mg/dL (ref 6–20)
CHLORIDE: 102 mmol/L (ref 101–111)
CO2: 30 mmol/L (ref 22–32)
Calcium: 9.3 mg/dL (ref 8.9–10.3)
Creatinine, Ser: 0.46 mg/dL (ref 0.44–1.00)
GFR calc Af Amer: >60 mL/min (ref >60)
GLUCOSE: 103 mg/dL — AB (ref 65–99)
Potassium: 4.2 mmol/L (ref 3.5–5.1)
Sodium: 139 mmol/L (ref 135–145)

## 2016-07-21 LAB — CULTURE, BLOOD (ROUTINE X 2)
CULTURE: NO GROWTH
CULTURE: NO GROWTH
SPECIAL REQUESTS: ADEQUATE
Special Requests: ADEQUATE

## 2016-07-21 LAB — GLUCOSE, CAPILLARY
GLUCOSE-CAPILLARY: 95 mg/dL (ref 65–99)
GLUCOSE-CAPILLARY: 103 mg/dL — AB (ref 65–99)
Glucose-Capillary: 113 mg/dL — ABNORMAL HIGH (ref 65–99)
Glucose-Capillary: 129 mg/dL — ABNORMAL HIGH (ref 65–99)

## 2016-07-21 LAB — CBC
HEMATOCRIT: 34.4 % — AB (ref 36.0–46.0)
HEMOGLOBIN: 10.1 g/dL — AB (ref 12.0–15.0)
MCH: 24.6 pg — ABNORMAL LOW (ref 26.0–34.0)
MCHC: 29.4 g/dL — ABNORMAL LOW (ref 30.0–36.0)
MCV: 83.9 fL (ref 78.0–100.0)
Platelets: 429 10*3/uL — ABNORMAL HIGH (ref 150–400)
RBC: 4.1 MIL/uL (ref 3.87–5.11)
RDW: 17.1 % — ABNORMAL HIGH (ref 11.5–15.5)
WBC: 27.8 10*3/uL — AB (ref 4.0–10.5)

## 2016-07-21 LAB — PHOSPHORUS: Phosphorus: 4.8 mg/dL — ABNORMAL HIGH (ref 2.5–4.6)

## 2016-07-21 LAB — MAGNESIUM: Magnesium: 2.4 mg/dL (ref 1.7–2.4)

## 2016-07-21 MED ORDER — ALLOPURINOL 100 MG PO TABS
100.0000 mg | ORAL_TABLET | Freq: Every day | ORAL | Status: DC
  Administered 2016-07-21 – 2016-07-24 (×4): 100 mg via ORAL
  Filled 2016-07-21 (×4): qty 1

## 2016-07-21 MED ORDER — NICOTINE 14 MG/24HR TD PT24
14.0000 mg | MEDICATED_PATCH | Freq: Every day | TRANSDERMAL | Status: DC
  Administered 2016-07-21 – 2016-07-24 (×4): 14 mg via TRANSDERMAL
  Filled 2016-07-21 (×4): qty 1

## 2016-07-21 MED ORDER — LORATADINE 10 MG PO TABS
10.0000 mg | ORAL_TABLET | Freq: Every day | ORAL | Status: DC | PRN
  Administered 2016-07-23: 10 mg via ORAL
  Filled 2016-07-21: qty 1

## 2016-07-21 MED ORDER — CARVEDILOL 6.25 MG PO TABS
6.2500 mg | ORAL_TABLET | Freq: Two times a day (BID) | ORAL | Status: DC
  Administered 2016-07-21 – 2016-07-24 (×7): 6.25 mg via ORAL
  Filled 2016-07-21 (×3): qty 1
  Filled 2016-07-21: qty 2
  Filled 2016-07-21 (×2): qty 1
  Filled 2016-07-21: qty 2

## 2016-07-21 MED ORDER — HYDROCODONE-ACETAMINOPHEN 5-325 MG PO TABS
1.0000 | ORAL_TABLET | Freq: Two times a day (BID) | ORAL | Status: DC | PRN
  Administered 2016-07-21 – 2016-07-23 (×5): 1 via ORAL
  Filled 2016-07-21 (×6): qty 1

## 2016-07-21 NOTE — Progress Notes (Signed)
Occupational Therapy Evaluation Note:  Pt admitted with below diagnosis and demonstrates the below listed deficits.  She requires min A for ADLs and reports she is close to her baseline level of functioning.  She has an aid 2 hours/day 7 days/week.  02 sats remained >92% on 2L 02.  Will continue to follow acutely.  No follow up OT recommended, and pt needs no DME at discharge.   07/21/16 1500  OT Visit Information  Last OT Received On 07/21/16  Assistance Needed +1  History of Present Illness Pt is a 56 y.o. female admitted to ED on 07/16/16 for dyspnea and wheezing; pt with resp failure requiring intubation 4/8-12. Pertinent PMH includes asthma, COPD (on 2L [place] at home), HF, DM, GERD, HTN, depression, anxiety, arthritis, gout, DVT.   Precautions  Precautions Fall  Restrictions  Weight Bearing Restrictions No  Home Living  Family/patient expects to be discharged to: Private residence  Living Arrangements Alone  Available Help at Discharge Personal care attendant;Family;Available PRN/intermittently  Type of Home Apartment  Home Access Level entry  Home Layout One level  Bathroom Shower/Tub Tub/shower unit  Corporate treasurer Yes  [place] - single point;Cane - quad;Walker - 2 wheels;BSC;Walker - 4 wheels;Tub bench  Prior Function  Level of Independence Needs assistance  Gait / Transfers Assistance Needed Mod indep amb with intermittent use of cane; rides scooter at grocery store. Does not drive.  ADL's / [place] assists 7 days/week for ~2 hrs/day; helps with cooking, cleaning, and shower occasionally.   Comments Pt reports she suses [employer] for transfportation and does her own grocery shopping.  She reports she ambulates in grocery store with her cane, or uses scooter if she is fatigued   Communication  Communication No difficulties  Pain Assessment  Pain Assessment Faces  Faces Pain Scale 4  Pain Location L knee  Pain  Descriptors / Indicators Aching  Pain Intervention(s) Monitored during session  Cognition  Arousal/Alertness Awake/alert  Behavior During Therapy WFL for tasks assessed/performed  Overall Cognitive Status Within Functional Limits for tasks assessed  General Comments Pt self distracts easily, this appears to be her baseline   Upper Extremity Assessment  Upper Extremity Assessment Generalized weakness  Lower Extremity Assessment  Lower Extremity Assessment Defer to PT evaluation  ADL  Overall ADL's  Needs assistance/impaired  Eating/Feeding Independent  Grooming Wash/dry hands;Wash/dry face;Oral care;Brushing hair;Min guard;Standing  Upper Body Bathing Sitting;Minimal assistance;Supervision/ safety  Lower Body Bathing Minimal assistance;Sit to/from stand  Upper Body Dressing  Minimal assistance;Sitting  Lower Body Dressing Minimal assistance;Sit to/from Environmental education officer guard;Ambulation;Comfort height toilet;Grab bars;RW  Toileting- Clothing Manipulation and Hygiene Minimal assistance;Sit to/from stand  Functional mobility during ADLs Min guard;Rolling walker  General ADL Comments Pt reports she is close to baseline  Bed Mobility  General bed mobility comments Pt up in chair  Transfers  Overall transfer level Needs assistance  Equipment used Rolling walker (2 wheeled)  Transfers Sit to/from [employer] Transfers  Sit to Stand Min guard  Stand pivot transfers Min guard  General transfer comment Pt stood with min guard for balance   Balance  Overall balance assessment Needs assistance  Sitting-balance support No upper extremity supported;Feet supported  Sitting balance-Leahy Scale Fair  Standing balance support Bilateral upper extremity supported  Standing balance-Leahy Scale Poor  Standing balance comment reliant on UE support   General Comments  General comments (skin integrity, edema, etc.) 02 sats remained >93% on 2L  Exercises  Exercises Other exercises   Other Exercises  Other Exercises Pt instructed to perform UE shoulder flexion or abduction x 5 reps, and to "kick legs" x 5 each commercial   OT - End of Session  Equipment Utilized During Treatment Rolling walker;Oxygen  Activity Tolerance Patient tolerated treatment well  Patient left in chair;with call bell/phone within reach;with chair alarm set  Nurse Communication Mobility status  OT Assessment  OT Recommendation/Assessment Patient needs continued OT Services  OT Visit Diagnosis Unsteadiness on feet (R26.81)  OT Problem List Decreased strength;Decreased activity tolerance;Impaired balance (sitting and/or standing);Decreased safety awareness;Cardiopulmonary status limiting activity;Obesity;Pain  OT Plan  OT Frequency (ACUTE ONLY) Min 2X/week  OT Treatment/Interventions (ACUTE ONLY) Self-care/ADL training;Therapeutic exercise;Energy conservation;DME and/or AE instruction;Therapeutic activities;Patient/family education;Balance training  AM-PAC OT "6 Clicks" Daily Activity Outcome Measure  Help from another person eating meals? 4  Help from another person taking care of personal grooming? 3  Help from another person toileting, which includes using toliet, bedpan, or urinal? 3  Help from another person bathing (including washing, rinsing, drying)? 3  Help from another person to put on and taking off regular upper body clothing? 3  Help from another person to put on and taking off regular lower body clothing? 3  6 Click Score 19  ADL G Code Conversion CK  OT Recommendation  Follow Up Recommendations No OT follow up;Supervision - Intermittent  OT Equipment None recommended by OT  Individuals Consulted  Consulted and Agree with Results and Recommendations Patient  Acute Rehab OT Goals  Patient Stated Goal Return home  OT Goal Formulation With patient  Time For Goal Achievement 07/28/16  Potential to Achieve Goals Good  OT Time Calculation  OT Start Time (ACUTE ONLY) 1202  OT Stop  Time (ACUTE ONLY) 1225  OT Time Calculation (min) 23 min  OT General Charges  $OT Visit 1 Procedure  OT Evaluation  $OT Eval Moderate Complexity 1 Procedure  OT Treatments  $Therapeutic Activity 8-22 mins  Written Expression  Dominant Hand Right  Lucille Passy, OTR/L [phone]

## 2016-07-21 NOTE — Progress Notes (Signed)
PULMONARY / CRITICAL CARE MEDICINE   Name: Michele Owens MRN: [id number] DOB: [birth year]    ADMISSION DATE:  07/16/2016 CONSULTATION DATE:  07/16/2016  REFERRING MD:  Dr. Jeanell Sparrow, EDP  CHIEF COMPLAINT:  Dyspnea   Brief Patient Summary:   56 year old female active smoker with PMH of asthma, COPD (on 2L [place] at home, last seen by Dr. Halford Chessman 08/2015 for sleep study), Diastolic HF, DM, GERD, and HTN. Presents to ED 4/8 with reported one day history of dyspnea, wheezing, productive cough, and BLE edema. Upon arrival to ED patient was lethargic however, arousable and responsive to questions. CXR revealed right lower lobe opacity, WBC 18, and temp 100.2. ABG 7.320/75.7/58, patient was placed on BIPAP but ultimately required intubation for continued somnolence. PCCM asked to admit.    SUBJECTIVE:   Mo issues overnight.  Tolerating extubation.  [place] well.  Used bipap, on and off.  On 2L O2 (baseline)  VITAL SIGNS: BP 122/75   Pulse 67   Temp 98.2 F (36.8 C) (Oral)   Resp 16   Ht 5\' 7"  (1.702 m)   Wt 129.1 kg (284 lb 9.8 oz)   SpO2 91%   BMI 44.58 kg/m   HEMODYNAMICS:    VENTILATOR SETTINGS: Vent Mode: BIPAP;PCV FiO2 (%):  [40 %] 40 % Set Rate:  [12 bmp] 12 bmp PEEP:  [6 cmH20-8 cmH20] 6 cmH20 Pressure Support:  [5 cmH20] 5 cmH20  INTAKE / OUTPUT: I/O last 3 completed shifts: In: 1611.9 [P.O.:200; I.V.:551.9; Other:120; NG/GT:140; IV Piggyback:600] Out: 5145 [FIEPP:2951]  PHYSICAL EXAMINATION: General:  Adult obese female sitting up in bed, no distress. Good cough.  HEENT: MM pink/moist, (-) NVD PSY: anxious Neuro: Alert, follows commands, MAE CV: s1s2 rrr, no m/r/g PULM: even/non-labored, lungs bilaterally clear. (-) wheezing/crackles GI: obese, soft, non-tender, bsx4 active  Extremities: warm/dry, less  edema  Skin: no rashes or lesions  LABS:  BMET  Recent Labs Lab 07/19/16 0240 07/20/16 0219 07/21/16 0219  NA 142 141 139  K 3.9 3.6 4.2  CL 100* 102 102   CO2 28 29 30   BUN 11 14 12   CREATININE 0.44 0.47 0.46  GLUCOSE 106* 142* 103*    Electrolytes  Recent Labs Lab 07/19/16 0240 07/20/16 0219 07/21/16 0219  CALCIUM 8.8* 9.2 9.3  MG 2.0 2.4 2.4  PHOS 5.0* 3.6 4.8*    CBC  Recent Labs Lab 07/19/16 0240 07/20/16 0219 07/21/16 0219  WBC 16.7* 23.0* 27.8*  HGB 9.5* 9.5* 10.1*  HCT 31.7* 32.0* 34.4*  PLT 334 391 429*    Coag's No results for input(s): APTT, INR in the last 168 hours.  Sepsis Markers  Recent Labs Lab 07/16/16 2106 07/17/16 0003 07/17/16 0252 07/18/16 0417 07/19/16 0240 07/20/16 0219  LATICACIDVEN 2.9* 3.3* 2.3*  --   --   --   PROCALCITON 2.26  --  1.70 2.44 2.79 1.83    ABG  Recent Labs Lab 07/17/16 0943 07/18/16 1030 07/19/16 0430  PHART 7.518* 7.312* 7.462*  PCO2ART 38.7 59.7* 42.8  PO2ART 59.0* 103 80.2*    Liver Enzymes  Recent Labs Lab 07/16/16 1422  AST 18  ALT 19  ALKPHOS 86  BILITOT 0.5  ALBUMIN 3.2*    Cardiac Enzymes  Recent Labs Lab 07/19/16 0807 07/19/16 1311 07/19/16 1920  TROPONINI <0.03 <0.03 0.03*    Glucose  Recent Labs Lab 07/20/16 0745 07/20/16 1113 07/20/16 1729 07/20/16 1919 07/20/16 2149 07/21/16 0807  GLUCAP 146* 135* 122* 153* 131* 113*  Imaging Dg Chest Port 1 View  Result Date: 07/20/2016 CLINICAL DATA:  Respiratory failure.  Ventilator support. EXAM: PORTABLE CHEST 1 VIEW COMPARISON:  07/19/2016 FINDINGS: Chronic enlargement of the cardiac silhouette. Endotracheal tube tip 1 cm above the carina. Nasogastric tube enters the abdomen. Persistent atelectasis/ infiltrate in both lower lobes with small effusions. No change since yesterday. IMPRESSION: No change. Persistent infiltrate/ atelectasis in both lower lungs with associated effusions. Electronically Signed   By: Nelson Chimes M.D.   On: 07/20/2016 08:26     STUDIES:  CXR 4/8 > Right lower lobe opacity, suspicious for PNA, cardiomegaly with vascular congestion ECHO 4/8 >>  LVEF 02-63%, grade 2 diastolic dysfunction, mild TR  CULTURES: MRSA PCR 4/9 >> negative Blood 4/8 >> (-) Sputum 4/8 >> (-) RVP 4/8 >> negative Strep p. 4/8 >> negative Legionella 4/8 >> negative  ANTIBIOTICS: Zosyn 4/8 in ED Vancomycin 4/8 in ED Azithromycin 4/8 >>4/12 Rocephin 4/8 >>  SIGNIFICANT EVENTS: 4/8 > Presents to ED with dyspnea > intubated  4/11 > Weaning; no cuff leak 4/12 > extubated  LINES/TUBES: ETT 4/8 >>  DISCUSSION: 56 year old female with COPD/Asthma on home oxygen presents with dyspnea, sputum production, febrile, and WBC 18. Intubated in ED. ABG with hypoxia/hypercarbia. CXR concerning for Right lower lobe PNA. Trated as CAP + Pulm edema.  Etubated and doing well. Pt continues to smoke cigarettes.    ASSESSMENT / PLAN:  PULMONARY A: Acute on Chronic hypoxemic/hypercarbic respiratory failure 2/2 Asthma/COPD exacerbation + Pulmonary edema/CHF exacerbation + CAP + OHS Tobacco Abuse Hx - sleep study 5/17 neg for OSA P:   Extubated on 4/12 and she is doing well. Used Bipap prn last night.  Transfer to telemetry.  Cont O2 2L to keep sats > 88% (home dose) S/P IV steroids >> holding off on further IV steroids unless she worsens.  Cont pulmicort + brovana + duoneb nebs while admitted.  Transition to home meds of asthma/copd over the weekend if she continues to improve Cont IV diuresis for now as long as creatinine OK and she still has some fluid in her. Transition to PO lasix in 1-2 days.  Finish 7 days of rocephin (end date 4/14). She finished 5 days of azithromycin May benefit from a rpt home sleep study as an oupt.    CARDIOVASCULAR A:  Pulm edema, improved CHF systolic, exacerbation, EF now 45% from previous of 55-60% in 2017.  H/O HTN, HLD, Diastolic HF P:  ICU monitoring  Diuresing with IV lasix.  Switch to PO in 1-2 days.  Continue home pravastatin Will restart coreg. Holding off on lisinopril for now as we are diurescing.    RENAL A:    Pulm edema P:   Cont IV lasix for now.  Trend BMP Replace electrolytes as needed   GASTROINTESTINAL A:   H/O GERD P:   Continue PO diet Continue PPI for GERD   HEMATOLOGIC A:   Anemia  P:  Trend CBC Transfuse for Hbg <7  Heparin SQ/ SCD for VTE prophalaxis   INFECTIOUS A:   CAP P:   Cultures negative Abx as above as written/discussed above She has elevated WBC likely from steroids.  Clinically she is improved. Will observe for now.    ENDOCRINE A:   H/O DM   P:   SSI coverage for now CBGs with meals Hold home metformin   NEUROLOGIC A:   Encephalopathy in setting of hypercarbia, resolved.   H/O Anxiety  Chronic pain Gout P:  RASS goal: 0 Continue home remeron and clonzepam She has severe arthritis for which she states she takes Norco q6 hrs prn but home meds state q12.  Will re start norco q12 for now. She states she usually just takes it once daily. Restart allopurinol for gout.    FAMILY  - Updates: Patient updated today.   Plan to transfer to telemetry today.  TRH will be primary 4/14. PCCM will be off on 4/14. Case d/w Dr. Reesa Chew.    Monica Becton, MD 07/21/2016, 8:11 AM [facility] Pager [phone] After 3 pm or if no answer, call [phone]

## 2016-07-21 NOTE — Progress Notes (Signed)
Nutrition Follow Up  DOCUMENTATION CODES:   Morbid obesity  INTERVENTION:    Continue Carbohydrate Modified diet  NUTRITION DIAGNOSIS:   Inadequate oral intake related to patient sedated and on vent as evidenced by NPO status, resolved  NEW GOAL:   Patient will meet greater than or equal to 90% of their needs, met  MONITOR:   PO intake, Labs, Weight trends, I & O's  ASSESSMENT:   56 year old female active smoker with PMH of asthma, COPD (on 2L [place] at home, last seen by Dr. Halford Chessman 08/2015 for sleep study), Diastolic HF, DM, GERD, and HTN. Presents to ED 4/8 with reported one day history of dyspnea, wheezing, productive cough, and BLE edema. Upon arrival to ED patient was lethargic however, arousable and responsive to questions. CXR revealed right lower lobe opacity, WBC 18, and temp 100.2. ABG 7.320/75.7/58, patient was placed on BIPAP but ultimately required intubation for continued somnolence.  Pt extubated 4/12. Advanced to Carbohydrate Modified diet last night. Reports a good appetite.  PO intake 100% for breakfast this AM. Labs reviewed.  Phosphorus 4.8 (H). CBG's [id number].  Diet Order:  Diet Carb Modified Fluid consistency: Thin; Room service appropriate? Yes  Skin:  Reviewed, no issues  Last BM:  4/12  Height:   Ht Readings from Last 1 Encounters:  07/17/16 '5\' 7"'  (1.702 m)   Weight:   Wt Readings from Last 1 Encounters:  07/21/16 284 lb 9.8 oz (129.1 kg)   Ideal Body Weight:  61.4 kg  BMI:  Body mass index is 44.58 kg/m.  Estimated Nutritional Needs:   Kcal:  1900-2100  Protein:  100-110 gm  Fluid:  1.9-2.0 L  EDUCATION NEEDS:   Education needs no appropriate at this time  Arthur Holms, RD, LDN Pager #: [phone] After-Hours Pager #: [phone]

## 2016-07-21 NOTE — Evaluation (Signed)
Physical Therapy Evaluation Patient Details Name: Michele Owens MRN: [id number] DOB: 1960/04/14 Today's Date: 07/21/2016   History of Present Illness  Pt is a 56 y.o. female admitted to ED on 07/16/16 for dyspnea and wheezing; pt with resp failure requiring intubation 4/8-12. Pertinent PMH includes asthma, COPD (on 2L O2 at home), HF, DM, GERD, HTN, depression, anxiety, arthritis, gout, DVT.     Clinical Impression  Pt presents to PT with generalized weakness, impaired balance, and an overall decrease in functional mobility secondary to above. PTA, pt lives alone and mod indep with amb using SPC; has home aide daily for 2hrs/day to assist with ADLs, including bathing and pericare as needed. Daughter lives nearby and provides assist PRN. Today, pt able to stand with RW and minA and amb to/from bathroom with RW and min guard for balance; dependent on assist for pericare. Pt educ on importance of mobility and fall risk prevention. Pt would benefit from continued acute PT services to maximize functional mobility prior to d/c home with PT.     Follow Up Recommendations Home health PT;Supervision for mobility/OOB    Equipment Recommendations  None recommended by PT    Recommendations for Other Services       Precautions / Restrictions Precautions Precautions: Fall Restrictions Weight Bearing Restrictions: No      Mobility  Bed Mobility Overal bed mobility: Needs Assistance Bed Mobility: Supine to Sit     Supine to sit: Min guard;HOB elevated     General bed mobility comments: Bed mob with min guard for balance  Transfers Overall transfer level: Needs assistance Equipment used: Rolling walker (2 wheeled) Transfers: Sit to/from Stand Sit to Stand: Min assist         General transfer comment: Stood x3 from bed and toilet with RW and minA for trunk support; able to stand from toilet for pericare with RW and min guard for balance. Repeated verbal cues for hand placement on RW.    Ambulation/Gait Ambulation/Gait assistance: Min guard Ambulation Distance (Feet): 10 Feet (+10) Assistive device: Rolling walker (2 wheeled) Gait Pattern/deviations: Step-through pattern;Decreased weight shift to left;Decreased stride length Gait velocity: Decreased Gait velocity interpretation: Below normal speed for age/gender General Gait Details: Amb 10' from bed to bathroom with RW and min guard for balance, and another 10' from bathroom to chair; slowed amb secondary to L knee pain, which pt reports is baseline. Required multiple verbal cues to keep RW closer to body  Stairs            Wheelchair Mobility    Modified Rankin (Stroke Patients Only)       Balance Overall balance assessment: Needs assistance Sitting-balance support: No upper extremity supported;Feet supported Sitting balance-Leahy Scale: Fair Sitting balance - Comments: Min guard for seated balance     Standing balance-Leahy Scale: Poor Standing balance comment: Reliant on BUE support and min guard for standing balance. 1x unannounced sit to bed secondary to L knee pain.                             Pertinent Vitals/Pain Pain Assessment: 0-10 Pain Score: 10-Worst pain ever Pain Location: L knee Pain Descriptors / Indicators: Aching Pain Intervention(s): Monitored during session;Limited activity within patient's tolerance    Home Living Family/patient expects to be discharged to:: Private residence Living Arrangements: Alone Available Help at Discharge: Personal care attendant;Family;Available PRN/intermittently (Daughter; daily aide 2hrs/day) Type of Home: Apartment Home Access: Level entry  Home Layout: One level Home Equipment: Cane - single point;Cane - quad;Walker - 2 wheels;Bedside commode;Shower seat      Prior Function Level of Independence: Needs assistance   Gait / Transfers Assistance Needed: Mod indep amb with intermittent use of cane; rides scooter at grocery  store. Does not drive.  ADL's / Homemaking Assistance Needed: Aide assists 7 days/week for ~2 hrs/day; helps with cooking, cleaning, and shower occasionally.         Hand Dominance        Extremity/Trunk Assessment   Upper Extremity Assessment Upper Extremity Assessment: Generalized weakness    Lower Extremity Assessment Lower Extremity Assessment: LLE deficits/detail LLE Deficits / Details: LLE grossly 3/5; pt reports LLE gout and hx of L knee problems that impair her amb at baseline       Communication   Communication: No difficulties  Cognition Arousal/Alertness: Awake/alert Behavior During Therapy: WFL for tasks assessed/performed Overall Cognitive Status: No family/caregiver present to determine baseline cognitive functioning Area of Impairment: Attention;Memory                   Current Attention Level: Selective Memory: Decreased short-term memory         General Comments: Pt appropriately conversing, at times repeating herself multiple times. When asked date, pt repeated said "the 4th month" requiring max cues to tell me "April"      General Comments General comments (skin integrity, edema, etc.): SpO2 remained >92% on 2L O2 [place].     Exercises     Assessment/Plan    PT Assessment Patient needs continued PT services  PT Problem List Decreased strength;Decreased mobility;Decreased activity tolerance;Decreased balance;Decreased knowledge of use of DME;Obesity       PT Treatment Interventions DME instruction;Therapeutic activities;Gait training;Therapeutic exercise;Patient/family education;Stair training;Balance training;Functional mobility training    PT Goals (Current goals can be found in the Care Plan section)  Acute Rehab PT Goals Patient Stated Goal: Return home PT Goal Formulation: With patient Time For Goal Achievement: 08/04/16 Potential to Achieve Goals: Good    Frequency Min 3X/week   Barriers to discharge        Co-evaluation                End of Session Equipment Utilized During Treatment: Gait belt;Oxygen Activity Tolerance: Patient tolerated treatment well Patient left: in chair;with call bell/phone within reach;with chair alarm set Nurse Communication: Mobility status PT Visit Diagnosis: Unsteadiness on feet (R26.81);Muscle weakness (generalized) (M62.81)    Time: 9833-8250 PT Time Calculation (min) (ACUTE ONLY): 42 min   Charges:   PT Evaluation $PT Eval Moderate Complexity: 1 Procedure PT Treatments $Gait Training: 8-22 mins $Therapeutic Activity: 8-22 mins   PT G Codes:       Michele Owens, SPT Office-[phone]  Mabeline Caras 07/21/2016, 11:34 AM

## 2016-07-22 ENCOUNTER — Encounter (HOSPITAL_COMMUNITY): Payer: Self-pay | Admitting: *Deleted

## 2016-07-22 DIAGNOSIS — I5031 Acute diastolic (congestive) heart failure: Secondary | ICD-10-CM

## 2016-07-22 DIAGNOSIS — D72829 Elevated white blood cell count, unspecified: Secondary | ICD-10-CM

## 2016-07-22 LAB — BASIC METABOLIC PANEL
Anion gap: 12 (ref 5–15)
BUN: 11 mg/dL (ref 6–20)
CHLORIDE: 99 mmol/L — AB (ref 101–111)
CO2: 28 mmol/L (ref 22–32)
CREATININE: 0.51 mg/dL (ref 0.44–1.00)
Calcium: 9.6 mg/dL (ref 8.9–10.3)
GFR calc Af Amer: >60 mL/min (ref >60)
GFR calc non Af Amer: >60 mL/min (ref >60)
GLUCOSE: 84 mg/dL (ref 65–99)
Potassium: 3.5 mmol/L (ref 3.5–5.1)
SODIUM: 139 mmol/L (ref 135–145)

## 2016-07-22 LAB — CBC
HEMATOCRIT: 36 % (ref 36.0–46.0)
HEMOGLOBIN: 10.6 g/dL — AB (ref 12.0–15.0)
MCH: 24.6 pg — AB (ref 26.0–34.0)
MCHC: 29.4 g/dL — AB (ref 30.0–36.0)
MCV: 83.5 fL (ref 78.0–100.0)
Platelets: 453 10*3/uL — ABNORMAL HIGH (ref 150–400)
RBC: 4.31 MIL/uL (ref 3.87–5.11)
RDW: 16.6 % — ABNORMAL HIGH (ref 11.5–15.5)
WBC: 25 10*3/uL — ABNORMAL HIGH (ref 4.0–10.5)

## 2016-07-22 LAB — MAGNESIUM: Magnesium: 2.1 mg/dL (ref 1.7–2.4)

## 2016-07-22 LAB — GLUCOSE, CAPILLARY
GLUCOSE-CAPILLARY: 104 mg/dL — AB (ref 65–99)
Glucose-Capillary: 104 mg/dL — ABNORMAL HIGH (ref 65–99)
Glucose-Capillary: 117 mg/dL — ABNORMAL HIGH (ref 65–99)
Glucose-Capillary: 100 mg/dL — ABNORMAL HIGH (ref 65–99)

## 2016-07-22 LAB — TRIGLYCERIDES: Triglycerides: 388 mg/dL — ABNORMAL HIGH (ref <150)

## 2016-07-22 LAB — PHOSPHORUS: Phosphorus: 4.2 mg/dL (ref 2.5–4.6)

## 2016-07-22 MED ORDER — METOPROLOL TARTRATE 5 MG/5ML IV SOLN
2.5000 mg | Freq: Once | INTRAVENOUS | Status: AC
  Administered 2016-07-22: 2.5 mg via INTRAVENOUS
  Filled 2016-07-22: qty 5

## 2016-07-22 MED ORDER — BENZONATATE 100 MG PO CAPS
200.0000 mg | ORAL_CAPSULE | Freq: Three times a day (TID) | ORAL | Status: DC | PRN
  Administered 2016-07-22 – 2016-07-23 (×2): 200 mg via ORAL
  Filled 2016-07-22 (×3): qty 2

## 2016-07-22 MED ORDER — CLONAZEPAM 0.5 MG PO TABS
1.0000 mg | ORAL_TABLET | Freq: Two times a day (BID) | ORAL | Status: DC
  Administered 2016-07-23 – 2016-07-24 (×3): 1 mg via ORAL
  Filled 2016-07-22 (×3): qty 2

## 2016-07-22 MED ORDER — IPRATROPIUM-ALBUTEROL 0.5-2.5 (3) MG/3ML IN SOLN
3.0000 mL | Freq: Three times a day (TID) | RESPIRATORY_TRACT | Status: DC
  Administered 2016-07-23: 3 mL via RESPIRATORY_TRACT
  Filled 2016-07-22: qty 3

## 2016-07-22 NOTE — Progress Notes (Signed)
Notified by CCMD that patients heart rate was in the 140's, RN had just left room when received this phone call.  RN went back into room, patient asleep.  RN woke patient up and patient denied feeling her heart race, denied shortness of breath, denied chest pain.  Notified MD of above, orders received.

## 2016-07-22 NOTE — Progress Notes (Signed)
PROGRESS NOTE    Michele Owens  SWN:[id number] DOB: April 18, 1960 DOA: 07/16/2016 PCP: Barbette Merino, MD   Chief Complaint  Patient presents with  . Shortness of Breath  . Other    left knee pain     Brief Narrative:   Assessment & Plan   Acute on chronic hypoxemic/hypercarbic respiratory failure/asthma and COPD exacerbation/pulmonary edema -Patient did require intubation and was successfully extubated on 07/20/2016 -Continue to use BiPAP daily at bedtime -Currently on 2 L oxygen which is her home amount -Continue Pulmicort, Brovana, no treatment -Steroids were discontinued -Continue IV diuresis -Monitor intake and output, daily weights -Currently on Rocephin, last dose today, 07/22/2016 -Completed 5 days of azithromycin -Patient may benefit from sleep study as an outpatient  Acute diastolic CHF exacerbation/pulmonary edema -Continue IV Lasix -Echocardiogram showed an EF of 93-81%, grade 2 diastolic dysfunction -monitor intake and output, daily weights  Leukocytosis -WBC up to 27.8, currently trending downward- 25  -Patient was receiving IV steroids which have been discontinued -Continue to monitor CBC  Community-acquired pneumonia -Antibiotics as above -Strep pneumonia and legionella antigens negative  Diabetes mellitus, type II -Metformin held -Continue insulin sliding scale a CBG monitoring  Acute encephalopathy -Resolved, in the setting of hypercarbia  Anxiety/depression -Continue clonazepam, Remeron  Gout -Continue allopurinol  GERD -Continue PPI  Chronic normocytic Anemia -Baseline hemoglobin between 9 and 10, currently 10.6 -Continue to monitor CBC  Morbid obseity -Nutrition consulted -Patient will need to follow with her PCP upon discharge and discuss lifestyle modification and treatment  DVT Prophylaxis  heparin  Code Status: Full  Family Communication: None at bedside  Disposition Plan: Admitted. Continue to monitor CHF and respiratory  status  Consultants PCCM  Procedures  Echocardiogram Intubation/extubation  Antibiotics   Anti-infectives    Start     Dose/Rate Route Frequency Ordered Stop   07/16/16 2200  cefTRIAXone (ROCEPHIN) 2 g in dextrose 5 % 50 mL IVPB     2 g 100 mL/hr over 30 Minutes Intravenous Every 24 hours 07/16/16 1949 07/22/16 2359   07/16/16 2000  azithromycin (ZITHROMAX) 500 mg in dextrose 5 % 250 mL IVPB     500 mg 250 mL/hr over 60 Minutes Intravenous Every 24 hours 07/16/16 1943 07/20/16 2130   07/16/16 1515  piperacillin-tazobactam (ZOSYN) IVPB 3.375 g     3.375 g 12.5 mL/hr over 240 Minutes Intravenous  Once 07/16/16 1502 07/16/16 1812   07/16/16 1515  vancomycin (VANCOCIN) IVPB 1000 mg/200 mL premix     1,000 mg 200 mL/hr over 60 Minutes Intravenous  Once 07/16/16 1502 07/16/16 1802      Subjective:   [name] seen and examined today. Patient states her breathing has improved. However has not been up walking yet. Does continue to have cough and using suction. Denies any chest pain, abdominal pain, nausea or vomiting, dizziness or headache.  Objective:   Vitals:   07/21/16 2143 07/22/16 0154 07/22/16 0513 07/22/16 1000  BP:  113/65 124/67 100/76  Pulse:  80 81 96  Resp:  18 18   Temp:  98 F (36.7 C) 98.4 F (36.9 C)   TempSrc:  Oral Oral   SpO2: 95% 95% 93% 93%  Weight:   79 kg (174 lb 3.2 oz)   Height:        Intake/Output Summary (Last 24 hours) at 07/22/16 1149 Last data filed at 07/22/16 0900  Gross per 24 hour  Intake              480  ml  Output             2500 ml  Net            -2020 ml   Filed Weights   07/21/16 0600 07/21/16 2029 07/22/16 0513  Weight: 129.1 kg (284 lb 9.8 oz) 121.8 kg (268 lb 8 oz) 79 kg (174 lb 3.2 oz)    Exam  General: Well developed, well nourished, NAD, appears stated age  HEENT: NCAT, mucous membranes moist.   Neck: Supple, Unable to assess JVD given body habitus  Cardiovascular: S1 S2 auscultated, no rubs, murmurs or  gallops. Regular rate and rhythm.  Respiratory: Diminished, however clear. No wheezing appreciated  Abdomen: Soft, Obese nontender, nondistended, + bowel sounds  Extremities: warm dry without cyanosis clubbing +LE edmea  Neuro: AAOx3, nonfocal  Psych: Normal affect and demeanor    Data Reviewed: I have personally reviewed following labs and imaging studies  CBC:  Recent Labs Lab 07/16/16 1422  07/18/16 0417 07/19/16 0240 07/20/16 0219 07/21/16 0219 07/22/16 0245  WBC 18.0*  < > 20.1* 16.7* 23.0* 27.8* 25.0*  NEUTROABS 15.5*  --   --   --   --   --   --   HGB 9.8*  < > 8.9* 9.5* 9.5* 10.1* 10.6*  HCT 34.8*  < > 30.5* 31.7* 32.0* 34.4* 36.0  MCV 86.4  < > 84.0 83.2 82.3 83.9 83.5  PLT 345  < > 323 334 391 429* 453*  < > = values in this interval not displayed. Basic Metabolic Panel:  Recent Labs Lab 07/18/16 0417 07/18/16 1633 07/19/16 0240 07/20/16 0219 07/21/16 0219 07/22/16 0245  NA 139  --  142 141 139 139  K 3.5  --  3.9 3.6 4.2 3.5  CL 101  --  100* 102 102 99*  CO2 29  --  28 29 30 28   GLUCOSE 108*  --  106* 142* 103* 84  BUN 5*  --  11 14 12 11   CREATININE 0.43*  --  0.44 0.47 0.46 0.51  CALCIUM 8.8*  --  8.8* 9.2 9.3 9.6  MG 2.2 2.0 2.0 2.4 2.4 2.1  PHOS 5.0* 4.2 5.0* 3.6 4.8* 4.2   GFR: Estimated Creatinine Clearance: 81.6 mL/min (by C-G formula based on SCr of 0.51 mg/dL). Liver Function Tests:  Recent Labs Lab 07/16/16 1422  AST 18  ALT 19  ALKPHOS 86  BILITOT 0.5  PROT 7.0  ALBUMIN 3.2*   No results for input(s): LIPASE, AMYLASE in the last 168 hours. No results for input(s): AMMONIA in the last 168 hours. Coagulation Profile: No results for input(s): INR, PROTIME in the last 168 hours. Cardiac Enzymes:  Recent Labs Lab 07/19/16 0807 07/19/16 1311 07/19/16 1920  TROPONINI <0.03 <0.03 0.03*   BNP (last 3 results) No results for input(s): PROBNP in the last 8760 hours. HbA1C: No results for input(s): HGBA1C in the last 72  hours. CBG:  Recent Labs Lab 07/21/16 0807 07/21/16 1151 07/21/16 1657 07/21/16 2116 07/22/16 0802  GLUCAP 113* 95 103* 129* 104*   Lipid Profile:  Recent Labs  07/19/16 2239  TRIG 234*   Thyroid Function Tests: No results for input(s): TSH, T4TOTAL, FREET4, T3FREE, THYROIDAB in the last 72 hours. Anemia Panel: No results for input(s): VITAMINB12, FOLATE, FERRITIN, TIBC, IRON, RETICCTPCT in the last 72 hours. Urine analysis:    Component Value Date/Time   COLORURINE YELLOW 07/16/2016 2036   APPEARANCEUR CLEAR 07/16/2016 2036   LABSPEC 1.012  07/16/2016 2036   PHURINE 8.0 07/16/2016 2036   GLUCOSEU NEGATIVE 07/16/2016 2036   HGBUR NEGATIVE 07/16/2016 2036   BILIRUBINUR NEGATIVE 07/16/2016 2036   KETONESUR NEGATIVE 07/16/2016 2036   PROTEINUR 100 (A) 07/16/2016 2036   UROBILINOGEN 0.2 02/28/2012 2004   NITRITE NEGATIVE 07/16/2016 2036   LEUKOCYTESUR TRACE (A) 07/16/2016 2036   Sepsis Labs: @LABRCNTIP (procalcitonin:4,lacticidven:4)  ) Recent Results (from the past 240 hour(s))  Blood culture (routine x 2)     Status: None   Collection Time: 07/16/16  3:20 PM  Result Value Ref Range Status   Specimen Description BLOOD RIGHT FOREARM  Final   Special Requests   Final    BOTTLES DRAWN AEROBIC AND ANAEROBIC Blood Culture adequate volume   Culture NO GROWTH 5 DAYS  Final   Report Status 07/21/2016 FINAL  Final  Blood culture (routine x 2)     Status: None   Collection Time: 07/16/16  3:25 PM  Result Value Ref Range Status   Specimen Description BLOOD LEFT HAND  Final   Special Requests   Final    BOTTLES DRAWN AEROBIC AND ANAEROBIC Blood Culture adequate volume   Culture NO GROWTH 5 DAYS  Final   Report Status 07/21/2016 FINAL  Final  Respiratory Panel by PCR     Status: None   Collection Time: 07/16/16  8:26 PM  Result Value Ref Range Status   Adenovirus NOT DETECTED NOT DETECTED Final   Coronavirus 229E NOT DETECTED NOT DETECTED Final   Coronavirus HKU1 NOT  DETECTED NOT DETECTED Final   Coronavirus NL63 NOT DETECTED NOT DETECTED Final   Coronavirus OC43 NOT DETECTED NOT DETECTED Final   Metapneumovirus NOT DETECTED NOT DETECTED Final   Rhinovirus / Enterovirus NOT DETECTED NOT DETECTED Final   Influenza A NOT DETECTED NOT DETECTED Final   Influenza B NOT DETECTED NOT DETECTED Final   Parainfluenza Virus 1 NOT DETECTED NOT DETECTED Final   Parainfluenza Virus 2 NOT DETECTED NOT DETECTED Final   Parainfluenza Virus 3 NOT DETECTED NOT DETECTED Final   Parainfluenza Virus 4 NOT DETECTED NOT DETECTED Final   Respiratory Syncytial Virus NOT DETECTED NOT DETECTED Final   Bordetella pertussis NOT DETECTED NOT DETECTED Final   Chlamydophila pneumoniae NOT DETECTED NOT DETECTED Final   Mycoplasma pneumoniae NOT DETECTED NOT DETECTED Final  MRSA PCR Screening     Status: None   Collection Time: 07/17/16 11:15 AM  Result Value Ref Range Status   MRSA by PCR NEGATIVE NEGATIVE Final    Comment:        The GeneXpert MRSA Assay (FDA approved for NASAL specimens only), is one component of a comprehensive MRSA colonization surveillance program. It is not intended to diagnose MRSA infection nor to guide or monitor treatment for MRSA infections.       Radiology Studies: Dg Chest Port 1 View  Result Date: 07/21/2016 CLINICAL DATA:  Community-acquired pneumonia.  Short of breath EXAM: PORTABLE CHEST 1 VIEW COMPARISON:  07/20/2016 FINDINGS: Improved aeration in the lung bases with decrease in bibasilar airspace disease. No effusion. Negative for edema. IMPRESSION: Bibasilar atelectasis/infiltrate with interval improvement from yesterday Electronically Signed   By: Franchot Gallo M.D.   On: 07/21/2016 09:33     Scheduled Meds: . allopurinol  100 mg Oral Daily  . arformoterol  15 mcg Nebulization BID  . budesonide (PULMICORT) nebulizer solution  0.5 mg Nebulization BID  . carvedilol  6.25 mg Oral BID WC  . cefTRIAXone (ROCEPHIN)  IV  2 g  Intravenous  Q24H  . clonazepam  1 mg Oral BID  . furosemide  20 mg Intravenous Q12H  . heparin  5,000 Units Subcutaneous Q8H  . insulin aspart  0-20 Units Subcutaneous TID WC  . insulin aspart  0-5 Units Subcutaneous QHS  . ipratropium-albuterol  3 mL Nebulization QID  . mouth rinse  15 mL Mouth Rinse BID  . mirtazapine  15 mg Oral QHS  . nicotine  14 mg Transdermal Daily  . pantoprazole  40 mg Oral Daily  . potassium chloride  40 mEq Oral BID  . pravastatin  40 mg Oral q1800   Continuous Infusions:   LOS: 6 days   Time Spent in minutes   30 minutes  Mikeya Tomasetti D.O. on 07/22/2016 at 11:49 AM  Between 7am to 7pm - Pager - [phone]  After 7pm go to [email or web address] - password [email or web address]  And look for the night coverage person covering for me after hours  [facility] Office  [phone]

## 2016-07-22 NOTE — Progress Notes (Signed)
Patient's heart rate is now in 90's.  Will continue to monitor.

## 2016-07-22 NOTE — Progress Notes (Signed)
Metoprolol 2.5 mg IV administered as per order, patient continues to deny any symptoms related to her tachycardia.  Will continue to monitor.  Daughter is at bedside.

## 2016-07-22 NOTE — Progress Notes (Signed)
Patients heart rate continues in the 90's since receiving Metoprolol earlier in the shift.  Patient has been up to [facility] multiple times and up to sink as well.  Daughter came to visit.  Patient then called RN to room stating she did not want her son or her brother to visit.  I told her the only way to facilitate this was to make her a XXX which she said she wanted.  Notified Network engineer of above.

## 2016-07-23 LAB — BASIC METABOLIC PANEL
Anion gap: 11 (ref 5–15)
BUN: 9 mg/dL (ref 6–20)
CALCIUM: 9.4 mg/dL (ref 8.9–10.3)
CO2: 27 mmol/L (ref 22–32)
CREATININE: 0.52 mg/dL (ref 0.44–1.00)
Chloride: 99 mmol/L — ABNORMAL LOW (ref 101–111)
GFR calc non Af Amer: >60 mL/min (ref >60)
Glucose, Bld: 111 mg/dL — ABNORMAL HIGH (ref 65–99)
Potassium: 3.9 mmol/L (ref 3.5–5.1)
SODIUM: 137 mmol/L (ref 135–145)

## 2016-07-23 LAB — CBC
HCT: 36.8 % (ref 36.0–46.0)
Hemoglobin: 11.1 g/dL — ABNORMAL LOW (ref 12.0–15.0)
MCH: 24.9 pg — ABNORMAL LOW (ref 26.0–34.0)
MCHC: 30.2 g/dL (ref 30.0–36.0)
MCV: 82.5 fL (ref 78.0–100.0)
PLATELETS: 447 10*3/uL — AB (ref 150–400)
RBC: 4.46 MIL/uL (ref 3.87–5.11)
RDW: 16.8 % — AB (ref 11.5–15.5)
WBC: 25.3 10*3/uL — ABNORMAL HIGH (ref 4.0–10.5)

## 2016-07-23 LAB — GLUCOSE, CAPILLARY
GLUCOSE-CAPILLARY: 113 mg/dL — AB (ref 65–99)
GLUCOSE-CAPILLARY: 121 mg/dL — AB (ref 65–99)
Glucose-Capillary: 119 mg/dL — ABNORMAL HIGH (ref 65–99)
Glucose-Capillary: 167 mg/dL — ABNORMAL HIGH (ref 65–99)

## 2016-07-23 LAB — PHOSPHORUS: PHOSPHORUS: 5.7 mg/dL — AB (ref 2.5–4.6)

## 2016-07-23 LAB — MAGNESIUM: MAGNESIUM: 2.4 mg/dL (ref 1.7–2.4)

## 2016-07-23 MED ORDER — IPRATROPIUM-ALBUTEROL 0.5-2.5 (3) MG/3ML IN SOLN
3.0000 mL | Freq: Two times a day (BID) | RESPIRATORY_TRACT | Status: DC
  Administered 2016-07-23 – 2016-07-24 (×2): 3 mL via RESPIRATORY_TRACT
  Filled 2016-07-23 (×2): qty 3

## 2016-07-23 NOTE — Progress Notes (Signed)
PROGRESS NOTE    Michele Owens  JJO:[id number] DOB: 09-22-1960 DOA: 07/16/2016 PCP: Barbette Merino, MD   Chief Complaint  Patient presents with  . Shortness of Breath  . Other    left knee pain     Brief Narrative:  HPI on 07/16/2016 by Dr. Simonne Maffucci, PCCM Information obtained from medical record as patient is intubated and sedated.  56 year old female active smoker with PMH of asthma, COPD (on 2L  at home, last seen by Dr. Halford Chessman 08/2015 for sleep study), Diastolic HF, DM, GERD, and HTN. Presents to ED 4/8 with reported one day history of dyspnea, wheezing, productive cough, and BLE edema. Upon arrival to ED patient was lethargic however, arousable and responsive to questions. CXR revealed right lower lobe opacity, WBC 18, and temp 100.2. ABG 7.320/75.7/58, patient was placed on BIPAP but ultimately required intubation for continued somnolence. PCCM asked to admit.   Patient's respiratory failure improving. Continue lasix for CHF/pulm edema.   Assessment & Plan   Acute on chronic hypoxemic/hypercarbic respiratory failure/asthma and COPD exacerbation/pulmonary edema -Patient did require intubation and was successfully extubated on 07/20/2016 -Continue to use BiPAP daily at bedtime -Currently on 2 L oxygen which is her home amount -Continue Pulmicort, Brovana, no treatment -Steroids were discontinued -Continue IV diuresis -Monitor intake and output, daily weights -Completed course of azithromycin and ceftriaxone  -Patient may benefit from sleep study as an outpatient  Acute diastolic CHF exacerbation/pulmonary edema -Continue IV Lasix -Echocardiogram showed an EF of 16-01%, grade 2 diastolic dysfunction -monitor intake and output, daily weights  Leukocytosis -WBC up to 27.8, currently trending downward- 25.3  -Patient was receiving IV steroids which have been discontinued -Continue to monitor CBC  Community-acquired pneumonia -Antibiotics as above -Strep pneumonia and  legionella antigens negative  Diabetes mellitus, type II -Metformin held -Continue insulin sliding scale a CBG monitoring  Acute encephalopathy -Resolved, in the setting of hypercarbia  Anxiety/depression -Continue clonazepam, Remeron  Gout -Continue allopurinol  GERD -Continue PPI  Chronic normocytic Anemia -Baseline hemoglobin between 9 and 10, currently 10.6 -Continue to monitor CBC  Morbid obseity -Nutrition consulted -Patient will need to follow with her PCP upon discharge and discuss lifestyle modification and treatment  DVT Prophylaxis  heparin  Code Status: Full  Family Communication: None at bedside  Disposition Plan: Admitted. Continue to monitor CHF and respiratory status.  Consultants PCCM  Procedures  Echocardiogram Intubation/extubation  Antibiotics   Anti-infectives    Start     Dose/Rate Route Frequency Ordered Stop   07/16/16 2200  cefTRIAXone (ROCEPHIN) 2 g in dextrose 5 % 50 mL IVPB     2 g 100 mL/hr over 30 Minutes Intravenous Every 24 hours 07/16/16 1949 07/22/16 2255   07/16/16 2000  azithromycin (ZITHROMAX) 500 mg in dextrose 5 % 250 mL IVPB     500 mg 250 mL/hr over 60 Minutes Intravenous Every 24 hours 07/16/16 1943 07/20/16 2130   07/16/16 1515  piperacillin-tazobactam (ZOSYN) IVPB 3.375 g     3.375 g 12.5 mL/hr over 240 Minutes Intravenous  Once 07/16/16 1502 07/16/16 1812   07/16/16 1515  vancomycin (VANCOCIN) IVPB 1000 mg/200 mL premix     1,000 mg 200 mL/hr over 60 Minutes Intravenous  Once 07/16/16 1502 07/16/16 1802      Subjective:   Michele Owens seen and examined today. Patient states her breathing has improved, however, has no moved much.  Does continue to have cough and some congestion. Denies any chest pain, abdominal pain, nausea or  vomiting, dizziness or headache.  Objective:   Vitals:   07/22/16 2023 07/22/16 2117 07/23/16 0600 07/23/16 0739  BP: 122/79  132/78   Pulse: 90 96 (!) 104   Resp: 20 18 (!) 22     Temp: 98.9 F (37.2 C)  98.2 F (36.8 C)   TempSrc: Oral  Oral   SpO2: 96%  94% 94%  Weight:   124.5 kg (274 lb 6.4 oz)   Height:        Intake/Output Summary (Last 24 hours) at 07/23/16 1058 Last data filed at 07/23/16 0535  Gross per 24 hour  Intake              480 ml  Output              300 ml  Net              180 ml   Filed Weights   07/21/16 2029 07/22/16 0513 07/23/16 0600  Weight: 121.8 kg (268 lb 8 oz) 79 kg (174 lb 3.2 oz) 124.5 kg (274 lb 6.4 oz)    Exam  General: Well developed, well nourished, NAD, appears stated age  HEENT: NCAT, mucous membranes moist.   Neck: Supple, Unable to assess JVD given body habitus  Cardiovascular: S1 S2 auscultated, RRR, no murmurs appreciated  Respiratory: Diminished, however clear. No wheezing appreciated  Abdomen: Soft, Obese nontender, nondistended, + bowel sounds  Extremities: warm dry without cyanosis clubbing +LE edmea  Neuro: AAOx3, nonfocal  Psych: Normal affect and demeanor    Data Reviewed: I have personally reviewed following labs and imaging studies  CBC:  Recent Labs Lab 07/16/16 1422  07/19/16 0240 07/20/16 0219 07/21/16 0219 07/22/16 0245 07/23/16 0552  WBC 18.0*  < > 16.7* 23.0* 27.8* 25.0* 25.3*  NEUTROABS 15.5*  --   --   --   --   --   --   HGB 9.8*  < > 9.5* 9.5* 10.1* 10.6* 11.1*  HCT 34.8*  < > 31.7* 32.0* 34.4* 36.0 36.8  MCV 86.4  < > 83.2 82.3 83.9 83.5 82.5  PLT 345  < > 334 391 429* 453* 447*  < > = values in this interval not displayed. Basic Metabolic Panel:  Recent Labs Lab 07/19/16 0240 07/20/16 0219 07/21/16 0219 07/22/16 0245 07/23/16 0552  NA 142 141 139 139 137  K 3.9 3.6 4.2 3.5 3.9  CL 100* 102 102 99* 99*  CO2 28 29 30 28 27   GLUCOSE 106* 142* 103* 84 111*  BUN 11 14 12 11 9   CREATININE 0.44 0.47 0.46 0.51 0.52  CALCIUM 8.8* 9.2 9.3 9.6 9.4  MG 2.0 2.4 2.4 2.1 2.4  PHOS 5.0* 3.6 4.8* 4.2 5.7*   GFR: Estimated Creatinine Clearance: 104.1 mL/min (by C-G  formula based on SCr of 0.52 mg/dL). Liver Function Tests:  Recent Labs Lab 07/16/16 1422  AST 18  ALT 19  ALKPHOS 86  BILITOT 0.5  PROT 7.0  ALBUMIN 3.2*   No results for input(s): LIPASE, AMYLASE in the last 168 hours. No results for input(s): AMMONIA in the last 168 hours. Coagulation Profile: No results for input(s): INR, PROTIME in the last 168 hours. Cardiac Enzymes:  Recent Labs Lab 07/19/16 0807 07/19/16 1311 07/19/16 1920  TROPONINI <0.03 <0.03 0.03*   BNP (last 3 results) No results for input(s): PROBNP in the last 8760 hours. HbA1C: No results for input(s): HGBA1C in the last 72 hours. CBG:  Recent Labs Lab 07/22/16  0802 07/22/16 1218 07/22/16 1604 07/22/16 2043 07/23/16 0745  GLUCAP 104* 104* 117* 100* 119*   Lipid Profile:  Recent Labs  07/22/16 2132  TRIG 388*   Thyroid Function Tests: No results for input(s): TSH, T4TOTAL, FREET4, T3FREE, THYROIDAB in the last 72 hours. Anemia Panel: No results for input(s): VITAMINB12, FOLATE, FERRITIN, TIBC, IRON, RETICCTPCT in the last 72 hours. Urine analysis:    Component Value Date/Time   COLORURINE YELLOW 07/16/2016 2036   APPEARANCEUR CLEAR 07/16/2016 2036   LABSPEC 1.012 07/16/2016 2036   PHURINE 8.0 07/16/2016 2036   GLUCOSEU NEGATIVE 07/16/2016 2036   HGBUR NEGATIVE 07/16/2016 2036   BILIRUBINUR NEGATIVE 07/16/2016 2036   KETONESUR NEGATIVE 07/16/2016 2036   PROTEINUR 100 (A) 07/16/2016 2036   UROBILINOGEN 0.2 02/28/2012 2004   NITRITE NEGATIVE 07/16/2016 2036   LEUKOCYTESUR TRACE (A) 07/16/2016 2036   Sepsis Labs: @LABRCNTIP (procalcitonin:4,lacticidven:4)  ) Recent Results (from the past 240 hour(s))  Blood culture (routine x 2)     Status: None   Collection Time: 07/16/16  3:20 PM  Result Value Ref Range Status   Specimen Description BLOOD RIGHT FOREARM  Final   Special Requests   Final    BOTTLES DRAWN AEROBIC AND ANAEROBIC Blood Culture adequate volume   Culture NO GROWTH 5  DAYS  Final   Report Status 07/21/2016 FINAL  Final  Blood culture (routine x 2)     Status: None   Collection Time: 07/16/16  3:25 PM  Result Value Ref Range Status   Specimen Description BLOOD LEFT HAND  Final   Special Requests   Final    BOTTLES DRAWN AEROBIC AND ANAEROBIC Blood Culture adequate volume   Culture NO GROWTH 5 DAYS  Final   Report Status 07/21/2016 FINAL  Final  Respiratory Panel by PCR     Status: None   Collection Time: 07/16/16  8:26 PM  Result Value Ref Range Status   Adenovirus NOT DETECTED NOT DETECTED Final   Coronavirus 229E NOT DETECTED NOT DETECTED Final   Coronavirus HKU1 NOT DETECTED NOT DETECTED Final   Coronavirus NL63 NOT DETECTED NOT DETECTED Final   Coronavirus OC43 NOT DETECTED NOT DETECTED Final   Metapneumovirus NOT DETECTED NOT DETECTED Final   Rhinovirus / Enterovirus NOT DETECTED NOT DETECTED Final   Influenza A NOT DETECTED NOT DETECTED Final   Influenza B NOT DETECTED NOT DETECTED Final   Parainfluenza Virus 1 NOT DETECTED NOT DETECTED Final   Parainfluenza Virus 2 NOT DETECTED NOT DETECTED Final   Parainfluenza Virus 3 NOT DETECTED NOT DETECTED Final   Parainfluenza Virus 4 NOT DETECTED NOT DETECTED Final   Respiratory Syncytial Virus NOT DETECTED NOT DETECTED Final   Bordetella pertussis NOT DETECTED NOT DETECTED Final   Chlamydophila pneumoniae NOT DETECTED NOT DETECTED Final   Mycoplasma pneumoniae NOT DETECTED NOT DETECTED Final  MRSA PCR Screening     Status: None   Collection Time: 07/17/16 11:15 AM  Result Value Ref Range Status   MRSA by PCR NEGATIVE NEGATIVE Final    Comment:        The GeneXpert MRSA Assay (FDA approved for NASAL specimens only), is one component of a comprehensive MRSA colonization surveillance program. It is not intended to diagnose MRSA infection nor to guide or monitor treatment for MRSA infections.       Radiology Studies: No results found.   Scheduled Meds: . allopurinol  100 mg Oral  Daily  . arformoterol  15 mcg Nebulization BID  . budesonide (PULMICORT)  nebulizer solution  0.5 mg Nebulization BID  . carvedilol  6.25 mg Oral BID WC  . clonazePAM  1 mg Oral BID  . furosemide  20 mg Intravenous Q12H  . heparin  5,000 Units Subcutaneous Q8H  . insulin aspart  0-20 Units Subcutaneous TID WC  . insulin aspart  0-5 Units Subcutaneous QHS  . ipratropium-albuterol  3 mL Nebulization BID  . mouth rinse  15 mL Mouth Rinse BID  . mirtazapine  15 mg Oral QHS  . nicotine  14 mg Transdermal Daily  . pantoprazole  40 mg Oral Daily  . potassium chloride  40 mEq Oral BID  . pravastatin  40 mg Oral q1800   Continuous Infusions:   LOS: 7 days   Time Spent in minutes   30 minutes  Drishti Pepperman D.O. on 07/23/2016 at 10:58 AM  Between 7am to 7pm - Pager - [phone]  After 7pm go to [email or web address] - password [email or web address]  And look for the night coverage person covering for me after hours  [facility] Office  [phone]

## 2016-07-24 DIAGNOSIS — J9692 Respiratory failure, unspecified with hypercapnia: Secondary | ICD-10-CM

## 2016-07-24 LAB — GLUCOSE, CAPILLARY
Glucose-Capillary: 109 mg/dL — ABNORMAL HIGH (ref 65–99)
Glucose-Capillary: 132 mg/dL — ABNORMAL HIGH (ref 65–99)

## 2016-07-24 LAB — CBC
HEMATOCRIT: 37.5 % (ref 36.0–46.0)
HEMOGLOBIN: 11.2 g/dL — AB (ref 12.0–15.0)
MCH: 24.7 pg — ABNORMAL LOW (ref 26.0–34.0)
MCHC: 29.9 g/dL — AB (ref 30.0–36.0)
MCV: 82.6 fL (ref 78.0–100.0)
Platelets: 466 10*3/uL — ABNORMAL HIGH (ref 150–400)
RBC: 4.54 MIL/uL (ref 3.87–5.11)
RDW: 16.7 % — AB (ref 11.5–15.5)
WBC: 28.6 10*3/uL — ABNORMAL HIGH (ref 4.0–10.5)

## 2016-07-24 LAB — BASIC METABOLIC PANEL
Anion gap: 15 (ref 5–15)
BUN: 7 mg/dL (ref 6–20)
CHLORIDE: 97 mmol/L — AB (ref 101–111)
CO2: 25 mmol/L (ref 22–32)
CREATININE: 0.6 mg/dL (ref 0.44–1.00)
Calcium: 9.4 mg/dL (ref 8.9–10.3)
GFR calc Af Amer: >60 mL/min (ref >60)
GFR calc non Af Amer: >60 mL/min (ref >60)
GLUCOSE: 107 mg/dL — AB (ref 65–99)
Potassium: 3.8 mmol/L (ref 3.5–5.1)
Sodium: 137 mmol/L (ref 135–145)

## 2016-07-24 MED ORDER — BUDESONIDE-FORMOTEROL FUMARATE 80-4.5 MCG/ACT IN AERO: 2.0000 | INHALATION_SPRAY | Freq: Two times a day (BID) | RESPIRATORY_TRACT | 0 refills | Status: DC

## 2016-07-24 MED ORDER — METOPROLOL TARTRATE 5 MG/5ML IV SOLN
INTRAVENOUS | Status: AC
  Filled 2016-07-24: qty 5

## 2016-07-24 MED ORDER — METOPROLOL TARTRATE 5 MG/5ML IV SOLN
2.5000 mg | Freq: Once | INTRAVENOUS | Status: AC
  Administered 2016-07-24: 2.5 mg via INTRAVENOUS

## 2016-07-24 MED ORDER — FUROSEMIDE 40 MG PO TABS: 40.0000 mg | ORAL_TABLET | Freq: Two times a day (BID) | ORAL | 0 refills | Status: DC

## 2016-07-24 MED ORDER — BENZONATATE 200 MG PO CAPS: 200.0000 mg | ORAL_CAPSULE | Freq: Three times a day (TID) | ORAL | 0 refills | Status: DC | PRN

## 2016-07-24 MED ORDER — NICOTINE 14 MG/24HR TD PT24: 14.0000 mg | MEDICATED_PATCH | Freq: Every day | TRANSDERMAL | 0 refills | Status: DC

## 2016-07-24 NOTE — Care Management Note (Signed)
Case Management Note  Patient Details  Name: Michele Owens MRN: [id number] Date of Birth: [birth year]  Subjective/Objective:      Admitted with Respiratory Failure             Action/Plan: PCP: Barbette Merino, MD; has Medicaid for medical / prescription drug coverage; patient need [facility] but does not know the agency and requested that CM call her PCP because she wants her [facility] Amy; Dr Leontine Locket office called and she was last seen by [place] Nov 2017; Butch Penny with [facility] called but they do not have the staffing for [facility] and cannot take the case; patient updated and stated that she wanted to talk to Amy directly and then she will call the CM back when she is at home.     Expected Discharge Date:  07/24/16               Expected Discharge Plan:  [place]  Discharge planning Services  CM Consult, Follow-up appt scheduled   Status of Service:  In process, will continue to follow  Sherrilyn Rist [phone] 07/24/2016, 12:04 PM

## 2016-07-24 NOTE — Progress Notes (Signed)
Pt is alert and oriented with frequent demands all through out  the night. Vitals stable

## 2016-07-24 NOTE — Progress Notes (Signed)
Physical Therapy Treatment Patient Details Name: Michele Owens MRN: [id number] DOB: 05/08/1960 Today's Date: 07/24/2016    History of Present Illness Pt is a 56 y.o. female admitted to ED on 07/16/16 for dyspnea and wheezing; pt with resp failure requiring intubation 4/8-12. Pertinent PMH includes asthma, COPD (on 2L O2 at home), HF, DM, GERD, HTN, depression, anxiety, arthritis, gout, DVT.     PT Comments    Pt very pleasant and very motivated to progress activity to return to her apartment. Pt denied need for AD with gait today and was able to maintain balance without. Pt educated for HEP and encouraged to continue along with walking program. Will follow.  HR 128 with gait, sats 92% on 2L    Follow Up Recommendations  Home health PT     Equipment Recommendations  None recommended by PT    Recommendations for Other Services       Precautions / Restrictions Precautions Precautions: Fall    Mobility  Bed Mobility               General bed mobility comments: pt standing in bathroom on arrival  Transfers Overall transfer level: Modified independent                  Ambulation/Gait Ambulation/Gait assistance: Modified independent (Device/Increase time) Ambulation Distance (Feet): 500 Feet Assistive device: None Gait Pattern/deviations: Step-through pattern;Decreased stride length   Gait velocity interpretation: Below normal speed for age/gender General Gait Details: pt with increased sway with gait, no LOB, slow steady gait with assist for O2 tank. O2 sats remained 92% on 2L throughout gait. Pt with shoes and left knee brace on    Stairs            Wheelchair Mobility    Modified Rankin (Stroke Patients Only)       Balance Overall balance assessment: Needs assistance   Sitting balance-Leahy Scale: Good       Standing balance-Leahy Scale: Good                              Cognition Arousal/Alertness: Awake/alert Behavior  During Therapy: WFL for tasks assessed/performed Overall Cognitive Status: Within Functional Limits for tasks assessed                                        Exercises General Exercises - Lower Extremity Long Arc Quad: AROM;20 reps;Both;Seated Hip Flexion/Marching: AROM;20 reps;Both;Seated    General Comments        Pertinent Vitals/Pain Pain Assessment: No/denies pain    Home Living                      Prior Function            PT Goals (current goals can now be found in the care plan section) Progress towards PT goals: Progressing toward goals    Frequency           PT Plan Current plan remains appropriate    Co-evaluation             End of Session Equipment Utilized During Treatment: Oxygen Activity Tolerance: Patient tolerated treatment well Patient left: in chair;with call bell/phone within reach Nurse Communication: Mobility status PT Visit Diagnosis: Difficulty in walking, not elsewhere classified (R26.2)     Time: 7494-4967 PT Time Calculation (min) (  ACUTE ONLY): 26 min  Charges:  $Gait Training: 8-22 mins $Therapeutic Exercise: 8-22 mins                    G Codes:       [name], PT [phone]    [place] 07/24/2016, 9:50 AM

## 2016-07-25 ENCOUNTER — Ambulatory Visit: Payer: Medicaid Other | Admitting: Podiatry

## 2016-07-26 NOTE — Discharge Summary (Signed)
[facility] Discharge Summary   Patient: Michele Owens YBO:[id number]   PCP: Barbette Merino, MD DOB: [birth year]   Date of admission: 07/16/2016   Date of discharge: 07/24/2016    Discharge Diagnoses:  Active Problems:   Leukocytosis   CAP (community acquired pneumonia)   Respiratory failure ([place])   Acute on chronic respiratory failure with hypoxemia ([facility])   Ventilator dependent ([place])   Acute hypoxemic respiratory failure (H[facility])   Acute pulmonary edema ([place])  Admitted From: home Disposition:  home  Recommendations for Outpatient Follow-up:  1. Please follow up with PCP in 1-2 weeks  Follow-up Information    GARBA,LAWAL, MD. Go on 08/01/2016.   Specialty:  Internal Medicine Why:  @2 :15pm Contact information: [place]. [place] [address] [phone]        Chesley Mires, MD. Daphane Shepherd on 08/23/2016.   Specialty:  Pulmonary Disease Why:  repeat sleep study per CCM, asthma follow up. @2pm  Contact information: [address] [address] [phone]          Diet recommendation: cardiac diet  Activity: The patient is advised to gradually reintroduce usual activities.  Discharge Condition: good  Code Status: full code  History of present illness: As per the H and P dictated on admission, "Information obtained from medical record as patient is intubated and sedated.  56 year old female active smoker with PMH of asthma, COPD (on 2L [place] at home, last seen by Dr. Halford Chessman 08/2015 for sleep study), Diastolic HF, DM, GERD, and HTN. Presents to ED 4/8 with reported one day history of dyspnea, wheezing, productive cough, and BLE edema. Upon arrival to ED patient was lethargic however, arousable and responsive to questions. CXR revealed right lower lobe opacity, WBC 18, and temp 100.2. ABG 7.320/75.7/58, patient was placed on BIPAP but ultimately required intubation for continued somnolence. PCCM asked to admit. "  Hospital Course:  Summary of her active problems in  the hospital is as following. Acute on chronic hypoxemic/hypercarbic respiratory failure/asthma and COPD exacerbation/pulmonary edema -Patient did require intubation and was successfully extubated on 07/20/2016 -Continue to use BiPAP daily at bedtime -Currently on 2 L oxygen which is her home amount -Continue Pulmicort, Brovana, no treatment -Steroids were discontinued -Completed course of azithromycin and ceftriaxone  -Patient may benefit from sleep study as an outpatient  Acute diastolic CHF exacerbation/pulmonary edema -Continue home diuretics. -Echocardiogram showed an EF of 86-76%, grade 2 diastolic dysfunction  Leukocytosis -WBC up to 27.8, currently trending downward- 25.3  -Patient was receiving IV steroids which have been discontinued -Continue to monitor CBC outpatient  Community-acquired pneumonia -Antibiotics as above -Strep pneumonia and legionella antigens negative  Diabetes mellitus, type II -Metformin held, continue on discharge. -Continue insulin sliding scale a CBG monitoring  Acute encephalopathy -Resolved, in the setting of hypercarbia  Anxiety/depression -Continue clonazepam, Remeron  Gout -Continue allopurinol  GERD -Continue PPI  Chronic normocytic Anemia -Baseline hemoglobin between 9 and 10, currently 10.6 -Continue to monitor CBC  Morbid obseity -Nutrition consulted -Patient will need to follow with her PCP upon discharge and discuss lifestyle modification and treatment  All other chronic medical condition were stable during the hospitalization.  Patient was seen by physical therapy, who recommended home health, which was arranged by Education officer, museum and case Freight forwarder. On the day of the discharge the patient's vitals were stable, and no other acute medical condition were reported by patient. the patient was felt safe to be discharge at home with home health.  Procedures and Results:  Echocardiogram  Consultations:  CCM primary  admission  DISCHARGE MEDICATION: Discharge Medication List as of 07/24/2016 11:22 AM    START taking these medications   Details  benzonatate (TESSALON) 200 MG capsule Take 1 capsule (200 mg total) by mouth 3 (three) times daily as needed for cough., Starting Mon 07/24/2016, Normal    budesonide-formoterol (SYMBICORT) 80-4.5 MCG/ACT inhaler Inhale 2 puffs into the lungs 2 (two) times daily., Starting Mon 07/24/2016, Normal    nicotine (NICODERM CQ - DOSED IN MG/24 HOURS) 14 mg/24hr patch Place 1 patch (14 mg total) onto the skin daily., Starting Tue 07/25/2016, Normal      CONTINUE these medications which have CHANGED   Details  furosemide (LASIX) 40 MG tablet Take 1 tablet (40 mg total) by mouth 2 (two) times daily., Starting Mon 07/24/2016, Normal      CONTINUE these medications which have NOT CHANGED   Details  acetaminophen (TYLENOL) 325 MG tablet Take 2 tablets (650 mg total) by mouth every 4 (four) hours as needed for headache or mild pain., Starting Thu 06/24/2015, OTC    Acetaminophen-Codeine (TYLENOL/CODEINE #3) 300-30 MG tablet Take 1 tablet by mouth every 6 (six) hours as needed for pain., Historical Med    albuterol (ACCUNEB) 1.25 MG/3ML nebulizer solution Take 1 ampule by nebulization 2 (two) times daily., Historical Med    albuterol (PROVENTIL HFA;VENTOLIN HFA) 108 (90 BASE) MCG/ACT inhaler Inhale 1-2 puffs into the lungs every 6 (six) hours as needed for wheezing., Starting Tue 04/09/2012, Print    allopurinol (ZYLOPRIM) 100 MG tablet Take 100 mg by mouth daily., Historical Med    carvedilol (COREG) 6.25 MG tablet Take 1 tablet (6.25 mg total) by mouth 2 (two) times daily with a meal., Starting Thu 02/10/2016, Normal    clonazePAM (KLONOPIN) 2 MG tablet Take 1 tablet (2 mg total) by mouth 2 (two) times daily., Starting Thu 01/27/2016, No Print    colchicine 0.6 MG tablet Take 0.6 mg by mouth 2 (two) times daily., Historical Med    diclofenac sodium (VOLTAREN) 1 % GEL  Apply 2 g topically 4 (four) times daily as needed (for pain). , Historical Med    dicyclomine (BENTYL) 20 MG tablet Take 1 tablet (20 mg total) by mouth 2 (two) times daily as needed (abdominal pain/cramping)., Starting Sun 11/28/2015, Print    EPINEPHrine 0.3 mg/0.3 mL IJ SOAJ injection Inject 0.3 mg into the muscle daily as needed (allergic reaction)., Historical Med    HYDROcodone-acetaminophen (NORCO/VICODIN) 5-325 MG tablet take 1 tablet by mouth every 12 hours WHEN NECESSARY PAIN (MUST LAST 2 WEEKS); Pt received 28 pills total, Historical Med    lisinopril (PRINIVIL,ZESTRIL) 2.5 MG tablet TAKE ONE TABLET BY MOUTH EVERY DAY, Normal    loratadine (CLARITIN) 10 MG tablet Take 10 mg by mouth daily as needed for allergies. , Historical Med    metFORMIN (GLUCOPHAGE) 500 MG tablet Take 1 tablet (500 mg total) by mouth 2 (two) times daily with a meal., Starting Fri 12/11/2014, Normal    mirtazapine (REMERON) 15 MG tablet Take 15 mg by mouth at bedtime., Starting Mon 11/16/2014, Historical Med    omeprazole (PRILOSEC) 40 MG capsule Take 40 mg by mouth daily., Starting Tue 04/13/2015, Historical Med    pravastatin (PRAVACHOL) 40 MG tablet Take 40 mg by mouth daily. , Starting Mon 03/29/2015, Historical Med    tiotropium (SPIRIVA) 18 MCG inhalation capsule Place 18 mcg into inhaler and inhale daily., Historical Med       Allergies  Allergen  Reactions  . Bee Venom Swelling and Other (See Comments)    "All over my body" (swelling)  . Ibuprofen Rash    Severe rash  . Lamisil [Terbinafine Hcl] Rash and Other (See Comments)    Pt states this causes her to "feel funny"  . Nsaids Other (See Comments)    Per MD's orders    Discharge Instructions    Diet - low sodium heart healthy    Complete by:  As directed    Discharge instructions    Complete by:  As directed    It is important that you read following instructions as well as go over your medication list with RN to help you understand your  care after this hospitalization.  Discharge Instructions: Please follow-up with PCP in one week  Please request your primary care physician to go over all Hospital Tests and Procedure/Radiological results at the follow up,  Please get all Hospital records sent to your PCP by signing hospital release before you go home.   Do not take more than prescribed Pain, Sleep and Anxiety Medications. You were cared for by a hospitalist during your hospital stay. If you have any questions about your discharge medications or the care you received while you were in the hospital after you are discharged, you can call the unit and ask to speak with the hospitalist on call if the hospitalist that took care of you is not available.  Once you are discharged, your primary care physician will handle any further medical issues. Please note that NO REFILLS for any discharge medications will be authorized once you are discharged, as it is imperative that you return to your primary care physician (or establish a relationship with a primary care physician if you do not have one) for your aftercare needs so that they can reassess your need for medications and monitor your lab values. You Must read complete instructions/literature along with all the possible adverse reactions/side effects for all the Medicines you take and that have been prescribed to you. Take any new Medicines after you have completely understood and accept all the possible adverse reactions/side effects. Wear Seat belts while driving. If you have smoked or chewed Tobacco in the last 2 yrs please stop smoking and/or stop any Recreational drug use.   Increase activity slowly    Complete by:  As directed      Discharge Exam: Filed Weights   07/22/16 0513 07/23/16 0600 07/24/16 0541  Weight: 79 kg (174 lb 3.2 oz) 124.5 kg (274 lb 6.4 oz) 122.6 kg (270 lb 4.8 oz)   Vitals:   07/24/16 0948 07/24/16 1152  BP:  98/69  Pulse: (!) 128 (!) 107  Resp:  (!)  22  Temp:  98.6 F (37 C)   General: Appear in no distress, no Rash; Oral Mucosa moist. Cardiovascular: S1 and S2 Present, no Murmur, no JVD Respiratory: Bilateral Air entry present and Clear to Auscultation, no Crackles, no wheezes Abdomen: Bowel Sound present, Soft and no tenderness Extremities: no Pedal edema, Neurology: Grossly no focal neuro deficit.  The results of significant diagnostics from this hospitalization (including imaging, microbiology, ancillary and laboratory) are listed below for reference.    Significant Diagnostic Studies: Dg Chest Port 1 View  Result Date: 07/21/2016 CLINICAL DATA:  Community-acquired pneumonia.  Short of breath EXAM: PORTABLE CHEST 1 VIEW COMPARISON:  07/20/2016 FINDINGS: Improved aeration in the lung bases with decrease in bibasilar airspace disease. No effusion. Negative for edema. IMPRESSION: Bibasilar atelectasis/infiltrate with  interval improvement from yesterday Electronically Signed   By: Franchot Gallo M.D.   On: 07/21/2016 09:33   Dg Chest Port 1 View  Result Date: 07/20/2016 CLINICAL DATA:  Respiratory failure.  Ventilator support. EXAM: PORTABLE CHEST 1 VIEW COMPARISON:  07/19/2016 FINDINGS: Chronic enlargement of the cardiac silhouette. Endotracheal tube tip 1 cm above the carina. Nasogastric tube enters the abdomen. Persistent atelectasis/ infiltrate in both lower lobes with small effusions. No change since yesterday. IMPRESSION: No change. Persistent infiltrate/ atelectasis in both lower lungs with associated effusions. Electronically Signed   By: Nelson Chimes M.D.   On: 07/20/2016 08:26   Dg Chest Port 1 View  Result Date: 07/19/2016 CLINICAL DATA:  Hypoxia EXAM: PORTABLE CHEST 1 VIEW COMPARISON:  July 18, 2016 FINDINGS: Endotracheal tube tip is 1.2 cm above the carina. Nasogastric tube tip and side port are below the diaphragm. No pneumothorax is evident. There are small pleural effusions bilaterally with patchy opacity in the lung  bases, likely combination of atelectasis and infiltrate. There is mild linear atelectasis in the left mid lower lung zones and right base regions as well. No appreciable edema. There is cardiomegaly with pulmonary venous hypertension. IMPRESSION: Tube positions as described without pneumothorax. Endotracheal tube is close to the carina and should be withdrawn approximately 2-3 cm. Evidence of a degree of congestive heart failure. Question superimposed pneumonia in the bases. There is atelectasis in the bases, and the opacities in the bases could be due to edema and atelectasis as opposed to pneumonia. Particular attention to these areas on subsequent evaluations warranted. Electronically Signed   By: Lowella Grip III M.D.   On: 07/19/2016 07:48   Dg Chest Port 1 View  Result Date: 07/18/2016 CLINICAL DATA:  Acute on chronic respiratory failure with hypoxemia EXAM: PORTABLE CHEST 1 VIEW COMPARISON:  07/17/2016 FINDINGS: Endotracheal tube and NG tube remain in place, unchanged. There is cardiomegaly with vascular congestion. Bilateral lower lobe airspace opacities with probable bilateral effusions. No acute bony abnormality. IMPRESSION: Cardiomegaly with vascular congestion. Stable bibasilar atelectasis or infiltrates with small effusions. Electronically Signed   By: Rolm Baptise M.D.   On: 07/18/2016 07:11   Dg Chest Port 1 View  Result Date: 07/17/2016 CLINICAL DATA:  Respiratory failure secondary to COPD. Ventilator dependent. EXAM: PORTABLE CHEST 1 VIEW COMPARISON:  One-view chest x-ray 07/16/2016 FINDINGS: The heart size is exaggerated by low lung volumes. Moderate pulmonary vascular congestion is stable. Bibasilar airspace disease is slightly improved. Endotracheal tube now terminates 2.5 cm above the carina. The an NG tube courses off the inferior border of the film. IMPRESSION: 1. Persistent low lung volumes and moderate pulmonary vascular congestion. 2. Improving bilateral lower lobe airspace  disease reflecting atelectasis or pneumonia. Electronically Signed   By: San Morelle M.D.   On: 07/17/2016 07:16   Dg Chest Portable 1 View  Result Date: 07/16/2016 CLINICAL DATA:  Intubated EXAM: PORTABLE CHEST 1 VIEW COMPARISON:  07/16/2016 at 1406 hours FINDINGS: Endotracheal tube at/ just above the carina, although partially obscured by overlying leads/ wires. Cardiomegaly with pulmonary vascular congestion. No frank interstitial edema. Patchy right lower lobe opacity, suspicious for pneumonia. No definite pleural effusions. No pneumothorax. Enteric tube courses into the stomach. IMPRESSION: Endotracheal tube at/just above the carina, although partially obscured. Cardiomegaly with pulmonary vascular congestion. No frank interstitial edema. Patchy right lower lobe opacity, suspicious for pneumonia. Electronically Signed   By: Julian Hy M.D.   On: 07/16/2016 17:04   Dg [facility]  Result Date: 07/16/2016 CLINICAL DATA:  Shortness of breath EXAM: PORTABLE CHEST 1 VIEW COMPARISON:  01/25/2016 FINDINGS: Limited rotated study.  Patient unable to help with positioning. Chronic cardiopericardial enlargement. Stable mediastinal contours when allowing for rotation. Bandlike opacities consistent with atelectasis. No convincing change from prior when allowing for differences in soft tissue positioning and obscuration. IMPRESSION: 1. Limited study due to patient factors, especially in evaluating the right base. 2. Patchy bilateral atelectasis or scarring, also seen previously. Cannot exclude superimposed pneumonia at the bases. 3. Cardiomegaly without failure. Electronically Signed   By: Monte Fantasia M.D.   On: 07/16/2016 14:19   Dg Abd Portable 1v  Result Date: 07/17/2016 CLINICAL DATA:  Post NG tube adjustment EXAM: PORTABLE ABDOMEN - 1 VIEW COMPARISON:  07/17/2016 FINDINGS: Advancement of the NG tube with the tip in the distal stomach. No evidence of bowel obstruction or free air.  IMPRESSION: Advancement of the NG tube with the tip likely in the distal stomach. Electronically Signed   By: Rolm Baptise M.D.   On: 07/17/2016 09:05   Dg Abd Portable 1v  Result Date: 07/17/2016 CLINICAL DATA:  56 year old female with nasogastric tube placement. Initial encounter. EXAM: PORTABLE ABDOMEN - 1 VIEW COMPARISON:  07/17/2016 chest x-ray. FINDINGS: Nasogastric tube tip projects over the left lower abdomen which may be within dilated stomach. Paucity of bowel gas. The possibility of free intraperitoneal air cannot be assessed on a supine view. Basilar consolidation.  Cardiomegaly. IMPRESSION: Nasogastric tube tip projects over the left lower abdomen which may be within dilated stomach. Paucity of bowel gas. Electronically Signed   By: Genia Del M.D.   On: 07/17/2016 07:15    Microbiology: Recent Results (from the past 240 hour(s))  MRSA PCR Screening     Status: None   Collection Time: 07/17/16 11:15 AM  Result Value Ref Range Status   MRSA by PCR NEGATIVE NEGATIVE Final    Comment:        The GeneXpert MRSA Assay (FDA approved for NASAL specimens only), is one component of a comprehensive MRSA colonization surveillance program. It is not intended to diagnose MRSA infection nor to guide or monitor treatment for MRSA infections.      Labs: CBC:  Recent Labs Lab 07/20/16 0219 07/21/16 0219 07/22/16 0245 07/23/16 0552 07/24/16 0249  WBC 23.0* 27.8* 25.0* 25.3* 28.6*  HGB 9.5* 10.1* 10.6* 11.1* 11.2*  HCT 32.0* 34.4* 36.0 36.8 37.5  MCV 82.3 83.9 83.5 82.5 82.6  PLT 391 429* 453* 447* 355*   Basic Metabolic Panel:  Recent Labs Lab 07/20/16 0219 07/21/16 0219 07/22/16 0245 07/23/16 0552 07/24/16 0249  NA 141 139 139 137 137  K 3.6 4.2 3.5 3.9 3.8  CL 102 102 99* 99* 97*  CO2 29 30 28 27 25   GLUCOSE 142* 103* 84 111* 107*  BUN 14 12 11 9 7   CREATININE 0.47 0.46 0.51 0.52 0.60  CALCIUM 9.2 9.3 9.6 9.4 9.4  MG 2.4 2.4 2.1 2.4  --   PHOS 3.6 4.8* 4.2  5.7*  --    Liver Function Tests: No results for input(s): AST, ALT, ALKPHOS, BILITOT, PROT, ALBUMIN in the last 168 hours. No results for input(s): LIPASE, AMYLASE in the last 168 hours. No results for input(s): AMMONIA in the last 168 hours. Cardiac Enzymes: No results for input(s): CKTOTAL, CKMB, CKMBINDEX, TROPONINI in the last 168 hours. BNP (last 3 results)  Recent Labs  08/09/15 1734 01/23/16 0456 07/17/16 0252  BNP 6.6 63.3 41.4  CBG:  Recent Labs Lab 07/23/16 1120 07/23/16 1656 07/23/16 2109 07/24/16 0738 07/24/16 1150  GLUCAP 167* 113* 121* 109* 132*   Time spent: 30 minutes  Signed:  Kline Bulthuis  [facility] 07/24/2016 , 10:46 PM

## 2016-08-01 ENCOUNTER — Telehealth (HOSPITAL_COMMUNITY): Payer: Self-pay | Admitting: *Deleted

## 2016-08-01 ENCOUNTER — Ambulatory Visit (HOSPITAL_COMMUNITY)
Admission: RE | Admit: 2016-08-01 | Discharge: 2016-08-01 | Disposition: A | Discharge status: Home / Self Care | Payer: Medicaid Other | Source: Ambulatory Visit | Attending: Cardiology | Admitting: Cardiology

## 2016-08-01 ENCOUNTER — Encounter (HOSPITAL_COMMUNITY): Payer: Self-pay

## 2016-08-01 VITALS — BP 144/83 | HR 98 | Wt 273.4 lb

## 2016-08-01 DIAGNOSIS — R079 Chest pain, unspecified: Secondary | ICD-10-CM | POA: Insufficient documentation

## 2016-08-01 DIAGNOSIS — I5040 Unspecified combined systolic (congestive) and diastolic (congestive) heart failure: Secondary | ICD-10-CM | POA: Diagnosis not present

## 2016-08-01 DIAGNOSIS — E876 Hypokalemia: Secondary | ICD-10-CM

## 2016-08-01 DIAGNOSIS — I471 Supraventricular tachycardia: Secondary | ICD-10-CM | POA: Insufficient documentation

## 2016-08-01 DIAGNOSIS — I5032 Chronic diastolic (congestive) heart failure: Secondary | ICD-10-CM | POA: Diagnosis not present

## 2016-08-01 DIAGNOSIS — Z85850 Personal history of malignant neoplasm of thyroid: Secondary | ICD-10-CM | POA: Diagnosis not present

## 2016-08-01 DIAGNOSIS — Z79899 Other long term (current) drug therapy: Secondary | ICD-10-CM | POA: Insufficient documentation

## 2016-08-01 DIAGNOSIS — C73 Malignant neoplasm of thyroid gland: Secondary | ICD-10-CM

## 2016-08-01 DIAGNOSIS — F329 Major depressive disorder, single episode, unspecified: Secondary | ICD-10-CM | POA: Insufficient documentation

## 2016-08-01 DIAGNOSIS — Z9981 Dependence on supplemental oxygen: Secondary | ICD-10-CM | POA: Insufficient documentation

## 2016-08-01 DIAGNOSIS — E119 Type 2 diabetes mellitus without complications: Secondary | ICD-10-CM | POA: Insufficient documentation

## 2016-08-01 DIAGNOSIS — Z7984 Long term (current) use of oral hypoglycemic drugs: Secondary | ICD-10-CM | POA: Diagnosis not present

## 2016-08-01 DIAGNOSIS — Z809 Family history of malignant neoplasm, unspecified: Secondary | ICD-10-CM | POA: Insufficient documentation

## 2016-08-01 DIAGNOSIS — Z833 Family history of diabetes mellitus: Secondary | ICD-10-CM | POA: Insufficient documentation

## 2016-08-01 DIAGNOSIS — Z87891 Personal history of nicotine dependence: Secondary | ICD-10-CM | POA: Insufficient documentation

## 2016-08-01 DIAGNOSIS — I110 Hypertensive heart disease with heart failure: Secondary | ICD-10-CM | POA: Diagnosis not present

## 2016-08-01 DIAGNOSIS — E669 Obesity, unspecified: Secondary | ICD-10-CM | POA: Insufficient documentation

## 2016-08-01 DIAGNOSIS — K219 Gastro-esophageal reflux disease without esophagitis: Secondary | ICD-10-CM | POA: Diagnosis not present

## 2016-08-01 DIAGNOSIS — M109 Gout, unspecified: Secondary | ICD-10-CM | POA: Diagnosis not present

## 2016-08-01 DIAGNOSIS — G4733 Obstructive sleep apnea (adult) (pediatric): Secondary | ICD-10-CM | POA: Diagnosis not present

## 2016-08-01 DIAGNOSIS — I10 Essential (primary) hypertension: Secondary | ICD-10-CM

## 2016-08-01 DIAGNOSIS — J449 Chronic obstructive pulmonary disease, unspecified: Secondary | ICD-10-CM | POA: Diagnosis not present

## 2016-08-01 DIAGNOSIS — E785 Hyperlipidemia, unspecified: Secondary | ICD-10-CM | POA: Diagnosis not present

## 2016-08-01 LAB — BASIC METABOLIC PANEL
Anion gap: 12 (ref 5–15)
BUN: 7 mg/dL (ref 6–20)
CALCIUM: 8.8 mg/dL — AB (ref 8.9–10.3)
CHLORIDE: 101 mmol/L (ref 101–111)
CO2: 25 mmol/L (ref 22–32)
CREATININE: 0.64 mg/dL (ref 0.44–1.00)
GFR calc Af Amer: >60 mL/min (ref >60)
Glucose, Bld: 121 mg/dL — ABNORMAL HIGH (ref 65–99)
Potassium: 2.7 mmol/L — CL (ref 3.5–5.1)
SODIUM: 138 mmol/L (ref 135–145)

## 2016-08-01 LAB — BRAIN NATRIURETIC PEPTIDE: B NATRIURETIC PEPTIDE 5: 9.7 pg/mL (ref 0.0–100.0)

## 2016-08-01 MED ORDER — POTASSIUM CHLORIDE CRYS ER 20 MEQ PO TBCR: 40.0000 meq | EXTENDED_RELEASE_TABLET | Freq: Every day | ORAL | 3 refills | Status: DC

## 2016-08-01 MED ORDER — LISINOPRIL 5 MG PO TABS: 5.0000 mg | ORAL_TABLET | Freq: Every day | ORAL | 6 refills | Status: DC

## 2016-08-01 MED ORDER — FUROSEMIDE 40 MG PO TABS: 80.0000 mg | ORAL_TABLET | Freq: Two times a day (BID) | ORAL | 0 refills | Status: DC

## 2016-08-01 NOTE — Patient Instructions (Signed)
Increase Lisinopril to 5 mg daily  Labs today  Labs in 2 weeks  You have been referred to [place] physician recommends that you schedule a follow-up appointment in: 1 month

## 2016-08-01 NOTE — Telephone Encounter (Signed)
Notes recorded by Scarlette Calico, RN on 08/01/2016 at 5:04 PM EDT Per Dr Aundra Dubin have pt start KCL 40 meq daily today and recheck on Fri 4/27. Called pt, she is aware she states she does take KCL but she is unsure what dose. Called pt's pharmacy, [facility], they state pt is NOT taking any KCL. Pt aware that RX was sent in, she can not come for labs until Mon due to transportation. Labs sch for Mon 4/30

## 2016-08-02 NOTE — Progress Notes (Signed)
Patient ID: Michele Owens, female   DOB: [birth year], 56 y.o.   MRN: [id number]     Advanced Heart Failure Clinic Note   PCP: Dr. Jonelle Sidle Cardiology: Dr. Aundra Dubin   56 yo with history of HTN, obesity, OSA, COPD, hyperlipidemia, and SVT presents for cardiology evaluation.  She no longer smokes.  She was admitted in 4/74 with diastolic CHF and COPD exacerbation.  She was admitted in 1/17 with COPD exacerbation.  She was set up for a Cardiolite in 2/17.  This showed a small, moderate intensity partially reversible inferolateral defect.  LHC/RHC in 3/17 showed no significant CAD, mildly elevated PCWP.  PFTs in 3/17 showed severe COPD.    Admitted 10/15-10/19/17 with AECOPD and A/C diastolic CHF.  Diuresed with IV lasix. Completed course for CAP. Pt also noted have multinodular goiter on [place].  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   Last echo in 4/18 showed EF 45-50%, moderate dilated RV with moderately decreased systolic function.   She was admitted again in 4/18 with COPD exacerbation, acute on chronic diastolic CHF, and CAP.    She says that she is feeling good today. Weight is down 14 lbs compared to last admission.  She is taking Lasix 80 mg bid.  Walking with a cane and using home oxygen.  No dyspnea walking short distances on flat ground.  Has not tried out lying flat.  No chest pain.    ECG (personally reviewed): NSR, normal  Labs (11/12): K 3.6, creatinine 0.73 Labs (12/13): K 3.9, creatinine 0.9, BNP 22 Labs (11/14): K 3.9, creatinine 0.5 Labs (2/17): K 4.2, creatinine 0.62, HCT 37.4 Labs (5/17): K 3.7, creatinine 0.7, BNP 6.6 Labs (4/18): K 3.8, creatinine 0.6, hgb 11.2  PMH: 1. Back and hip pain 2. HTN 3. Obesity 4. GERD 5. Hyperlipidemia 6. Depression 7. COPD: Has quit smoking. Now on home oxygen but does not use all the time.  PFTs (3/17) with severe COPD.  8. 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 Event  monitor (12/14) with only NSR noted.  10. Chest pain: Atypical.  ETT-Cardiolite (12/13) with EF 60%, no ischemia/infarction. Lexiscan Cardiolite (2/17) with small, moderate-intensity partially reversible inferolateral defect. LHC (3/17): No significant CAD. 11. Syncope: Uncertain etiology.   12. OSA: Cannot tolerate CPAP. Sleep study negative for OSA in 57/17.  13. Gout 14. Migraines 15. Diabetes 16. Diastolic CHF: Echo (2/59) with EF 55-60%, grade II diastolic dysfunction.  - RHC (3/17) with mean RA 8, PA 45/20 mean 30, mean PCWP 20; PVR 1.5 WU.  - Echo (4/18): EF 45-50%, diffuse hypokinesis, grade II diastolic dysfunction, moderately dilated RV with moderately decreased systolic function.  17. Papillary thyroid carcinoma: Noted on biopsy 3/18.   SH: Former smoker.  Lives alone.  Does not use drugs or ETOH.   FH: Father with cancer, mother with diabetes, brother with fen-phen related valvulopathy  ROS: All systems reviewed and negative except as per HPI.   Current Outpatient Prescriptions  Medication Sig Dispense Refill  . acetaminophen (TYLENOL) 325 MG tablet Take 2 tablets (650 mg total) by mouth every 4 (four) hours as needed for headache or mild pain.    . Acetaminophen-Codeine (TYLENOL/CODEINE #3) 300-30 MG tablet Take 1 tablet by mouth every 6 (six) hours as needed for pain.    Marland Kitchen albuterol (ACCUNEB) 1.25 MG/3ML nebulizer solution Take 1 ampule by nebulization 2 (two) times daily.    Marland Kitchen albuterol (PROVENTIL HFA;VENTOLIN HFA) 108 (  90 BASE) MCG/ACT inhaler Inhale 1-2 puffs into the lungs every 6 (six) hours as needed for wheezing. 1 Inhaler 0  . allopurinol (ZYLOPRIM) 100 MG tablet Take 100 mg by mouth daily.    . benzonatate (TESSALON) 200 MG capsule Take 1 capsule (200 mg total) by mouth 3 (three) times daily as needed for cough. 30 capsule 0  . budesonide-formoterol (SYMBICORT) 80-4.5 MCG/ACT inhaler Inhale 2 puffs into the lungs 2 (two) times daily. 1 Inhaler 0  . carvedilol (COREG)  6.25 MG tablet Take 1 tablet (6.25 mg total) by mouth 2 (two) times daily with a meal. 60 tablet 6  . clonazePAM (KLONOPIN) 2 MG tablet Take 1 tablet (2 mg total) by mouth 2 (two) times daily. (Patient taking differently: Take 2 mg by mouth 3 (three) times daily. ) 30 tablet 0  . colchicine 0.6 MG tablet Take 0.6 mg by mouth 2 (two) times daily.    . diclofenac sodium (VOLTAREN) 1 % GEL Apply 2 g topically 4 (four) times daily as needed (for pain).     Marland Kitchen dicyclomine (BENTYL) 20 MG tablet Take 1 tablet (20 mg total) by mouth 2 (two) times daily as needed (abdominal pain/cramping). 20 tablet 0  . EPINEPHrine 0.3 mg/0.3 mL IJ SOAJ injection Inject 0.3 mg into the muscle daily as needed (allergic reaction).    . furosemide (LASIX) 40 MG tablet Take 2 tablets (80 mg total) by mouth 2 (two) times daily. 30 tablet 0  . HYDROcodone-acetaminophen (NORCO/VICODIN) 5-325 MG tablet take 1 tablet by mouth every 12 hours WHEN NECESSARY PAIN (MUST LAST 2 WEEKS); Pt received 28 pills total  0  . lisinopril (PRINIVIL,ZESTRIL) 5 MG tablet Take 1 tablet (5 mg total) by mouth daily. 30 tablet 6  . loratadine (CLARITIN) 10 MG tablet Take 10 mg by mouth daily as needed for allergies.     . metFORMIN (GLUCOPHAGE) 500 MG tablet Take 1 tablet (500 mg total) by mouth 2 (two) times daily with a meal. 30 tablet 1  . mirtazapine (REMERON) 15 MG tablet Take 15 mg by mouth at bedtime.  0  . nicotine (NICODERM CQ - DOSED IN MG/24 HOURS) 14 mg/24hr patch Place 1 patch (14 mg total) onto the skin daily. 28 patch 0  . omeprazole (PRILOSEC) 40 MG capsule Take 40 mg by mouth daily.  2  . pravastatin (PRAVACHOL) 40 MG tablet Take 40 mg by mouth daily.   2  . tiotropium (SPIRIVA) 18 MCG inhalation capsule Place 18 mcg into inhaler and inhale daily.    . potassium chloride SA (K-DUR,KLOR-CON) 20 MEQ tablet Take 2 tablets (40 mEq total) by mouth daily. 60 tablet 3   No current facility-administered medications for this encounter.     BP  (!) 144/83   Pulse 98   Wt 273 lb 6 oz (124 kg)   SpO2 99%   BMI 45.49 kg/m    Wt Readings from Last 3 Encounters:  08/01/16 273 lb 6 oz (124 kg)  07/24/16 270 lb 4.8 oz (122.6 kg)  02/10/16 287 lb 12.8 oz (130.5 kg)    General: NAD, obese.  Neck: Thick, JVP not elevated, no thyromegaly or thyroid nodule.  Lungs: Decreased BS diffusely bilaterally.  CV: Nondisplaced PMI.  Heart regular S1/S2, no S3/S4, 1/6 SEM RUSB.  No edema.  No carotid bruit.  Normal pedal pulses.  Abdomen: Soft, nontender, no hepatosplenomegaly, no distention.  Skin: Intact without lesions or rashes.  Neurologic: Alert and oriented x 3.  Psych: Normal affect.  Extremities: No clubbing or cyanosis.   Assessment/Plan: 1. Chest pain: No significant CAD on 3/17 cath.   - She has not had any further CP.  2. Chronic primarily diastolic CHF: Echo (4/70) with EF 45-50%, moderately dilated RV with moderately decreased systolic function.  NYHA II symptoms. Volume status stable.  - Continue Lasix 80 mg BID.  - BMET/BNP today.   - She is on Coreg and lisiniopril.  3. OSA: Sleep study 08/2015 did not show significant OSA.    4. COPD: She has finally quit smoking. Severe obstruction by PFTs.  Continue home 02 at 2 L.  5. HTN: BP mildly elevated.  Increase lisinopril to 5 mg daily and repeat BMET in 2 wks.  6. Papillary thyroid carcinoma: Noted 3/18 by needle biopsy.  Needs oncology evaluation, I will arrange.  Followup in 1 month with PA/NP.    Loralie Champagne,  08/02/2016

## 2016-08-04 ENCOUNTER — Telehealth (HOSPITAL_COMMUNITY): Payer: Self-pay | Admitting: Cardiology

## 2016-08-04 NOTE — Telephone Encounter (Signed)
Surgical clearance faxed to [facility] [place], LPN  Per Dr Aundra Dubin  patient is cleared to proceed with surgery, higher risk due to COPD   Patient to be scheduled for total thyroidectomy

## 2016-08-07 ENCOUNTER — Ambulatory Visit (HOSPITAL_COMMUNITY)
Admission: RE | Admit: 2016-08-07 | Discharge: 2016-08-07 | Disposition: A | Discharge status: Home / Self Care | Payer: Medicaid Other | Source: Ambulatory Visit | Attending: Cardiology | Admitting: Cardiology

## 2016-08-07 DIAGNOSIS — E876 Hypokalemia: Secondary | ICD-10-CM | POA: Insufficient documentation

## 2016-08-07 LAB — BASIC METABOLIC PANEL
Anion gap: 8 (ref 5–15)
BUN: 5 mg/dL — ABNORMAL LOW (ref 6–20)
CALCIUM: 8.7 mg/dL — AB (ref 8.9–10.3)
CO2: 31 mmol/L (ref 22–32)
CREATININE: 0.47 mg/dL (ref 0.44–1.00)
Chloride: 100 mmol/L — ABNORMAL LOW (ref 101–111)
GFR calc Af Amer: >60 mL/min (ref >60)
GLUCOSE: 113 mg/dL — AB (ref 65–99)
Potassium: 3.5 mmol/L (ref 3.5–5.1)
Sodium: 139 mmol/L (ref 135–145)

## 2016-08-09 ENCOUNTER — Ambulatory Visit: Payer: Self-pay | Admitting: Surgery

## 2016-08-15 ENCOUNTER — Inpatient Hospital Stay (HOSPITAL_COMMUNITY): Admission: RE | Admit: 2016-08-15 | Payer: Medicaid Other | Source: Ambulatory Visit

## 2016-08-22 ENCOUNTER — Other Ambulatory Visit (HOSPITAL_COMMUNITY): Payer: Self-pay | Admitting: Student

## 2016-08-22 ENCOUNTER — Encounter: Payer: Self-pay | Admitting: Podiatry

## 2016-08-22 ENCOUNTER — Ambulatory Visit (INDEPENDENT_AMBULATORY_CARE_PROVIDER_SITE_OTHER): Payer: Medicaid Other | Admitting: Podiatry

## 2016-08-22 DIAGNOSIS — M722 Plantar fascial fibromatosis: Secondary | ICD-10-CM

## 2016-08-22 DIAGNOSIS — B351 Tinea unguium: Secondary | ICD-10-CM

## 2016-08-22 DIAGNOSIS — M79676 Pain in unspecified toe(s): Secondary | ICD-10-CM

## 2016-08-23 ENCOUNTER — Telehealth: Payer: Self-pay | Admitting: Pulmonary Disease

## 2016-08-23 ENCOUNTER — Ambulatory Visit (INDEPENDENT_AMBULATORY_CARE_PROVIDER_SITE_OTHER): Payer: Medicaid Other | Admitting: Acute Care

## 2016-08-23 ENCOUNTER — Other Ambulatory Visit (INDEPENDENT_AMBULATORY_CARE_PROVIDER_SITE_OTHER): Payer: Medicaid Other

## 2016-08-23 ENCOUNTER — Encounter: Payer: Self-pay | Admitting: Acute Care

## 2016-08-23 ENCOUNTER — Ambulatory Visit (INDEPENDENT_AMBULATORY_CARE_PROVIDER_SITE_OTHER)
Admission: RE | Admit: 2016-08-23 | Discharge: 2016-08-23 | Disposition: A | Discharge status: Home / Self Care | Payer: Medicaid Other | Source: Ambulatory Visit | Attending: Acute Care | Admitting: Acute Care

## 2016-08-23 ENCOUNTER — Telehealth: Payer: Self-pay | Admitting: *Deleted

## 2016-08-23 VITALS — BP 128/80 | HR 99 | Ht 65.0 in | Wt 282.6 lb

## 2016-08-23 DIAGNOSIS — J189 Pneumonia, unspecified organism: Secondary | ICD-10-CM

## 2016-08-23 DIAGNOSIS — Z720 Tobacco use: Secondary | ICD-10-CM

## 2016-08-23 DIAGNOSIS — J9612 Chronic respiratory failure with hypercapnia: Secondary | ICD-10-CM | POA: Diagnosis not present

## 2016-08-23 DIAGNOSIS — G4733 Obstructive sleep apnea (adult) (pediatric): Secondary | ICD-10-CM

## 2016-08-23 DIAGNOSIS — F17210 Nicotine dependence, cigarettes, uncomplicated: Secondary | ICD-10-CM

## 2016-08-23 LAB — CBC WITH DIFFERENTIAL/PLATELET
Basophils Absolute: 0.2 10*3/uL — ABNORMAL HIGH (ref 0.0–0.1)
Basophils Relative: 1 % (ref 0.0–3.0)
EOS ABS: 0.2 10*3/uL (ref 0.0–0.7)
Eosinophils Relative: 1 % (ref 0.0–5.0)
HEMATOCRIT: 37.1 % (ref 36.0–46.0)
Hemoglobin: 11.5 g/dL — ABNORMAL LOW (ref 12.0–15.0)
LYMPHS PCT: 14.7 % (ref 12.0–46.0)
Lymphs Abs: 2.6 10*3/uL (ref 0.7–4.0)
MCHC: 30.9 g/dL (ref 30.0–36.0)
MCV: 82.8 fl (ref 78.0–100.0)
MONO ABS: 1 10*3/uL (ref 0.1–1.0)
Monocytes Relative: 5.6 % (ref 3.0–12.0)
Neutro Abs: 13.9 10*3/uL — ABNORMAL HIGH (ref 1.4–7.7)
Neutrophils Relative %: 77.7 % — ABNORMAL HIGH (ref 43.0–77.0)
Platelets: 352 10*3/uL (ref 150.0–400.0)
RBC: 4.48 Mil/uL (ref 3.87–5.11)
RDW: 17.1 % — ABNORMAL HIGH (ref 11.5–15.5)
WBC: 17.9 10*3/uL — ABNORMAL HIGH (ref 4.0–10.5)

## 2016-08-23 LAB — COMPREHENSIVE METABOLIC PANEL
ALBUMIN: 4.2 g/dL (ref 3.5–5.2)
ALK PHOS: 82 U/L (ref 39–117)
ALT: 12 U/L (ref 0–35)
AST: 14 U/L (ref 0–37)
BUN: 7 mg/dL (ref 6–23)
CALCIUM: 9.5 mg/dL (ref 8.4–10.5)
CO2: 32 mEq/L (ref 19–32)
CREATININE: 0.52 mg/dL (ref 0.40–1.20)
Chloride: 99 mEq/L (ref 96–112)
GFR: 156.66 mL/min (ref >60.00)
Glucose, Bld: 93 mg/dL (ref 70–99)
POTASSIUM: 4.1 meq/L (ref 3.5–5.1)
SODIUM: 137 meq/L (ref 135–145)
TOTAL PROTEIN: 7.4 g/dL (ref 6.0–8.3)
Total Bilirubin: 0.4 mg/dL (ref 0.2–1.2)

## 2016-08-23 NOTE — Telephone Encounter (Signed)
lmtcb X1 for pt  

## 2016-08-23 NOTE — Patient Instructions (Addendum)
It is nice to meet you today. CXR today. We will call with results. We will schedule you for a overnight oximetry. We will draw some labs today. ( CMET, CBC) Continue using your Spiriva and Symbicort daily as you have been doing. Rinse mouth after use. Continue using your nebulizer treatments as you have been doing. Continue wearing your oxygen at 2 L every night. Follow up in 4 -6 weeks with NP or Dr. Halford Chessman Please contact office for sooner follow up if symptoms do not improve or worsen or seek emergency care

## 2016-08-23 NOTE — Telephone Encounter (Signed)
Called by Michele Owens who was under the impression that someone would come out to her home tonight to measure her nocturnal oxygen saturation. I explained to her that this was something that was arranged by the PCCM office with a home health provider and needed to be scheduled by the office. She was told not to expect someone to come tonight. I requested that she call the PCCM office in the morning and inquire as to who the referral was placed with so that she could know who to expect to come to her home to measure her nocturnal oxygen saturation and when to expect them.

## 2016-08-23 NOTE — Assessment & Plan Note (Signed)
Counseled extensively to remain smoke free Has nicoderm patch on in the office today Plan: Continue to remain smoke free Use Nicoderm patch as directed in the hospital Call us for assistance if you feel you need to smoke again.

## 2016-08-23 NOTE — Assessment & Plan Note (Signed)
Patient has previously failed CPAP Most recent sleep study indicated AHI 3.6, SpO2 low 80%.  Spent 13.6 min with SpO2 < 88%.  Needed 2 liters supplemental oxygen. Recent hospitalization for acute on chronic hypoxemic/hypercarbic respiratory failure Required intubation and BiPAP Plan We will schedule you for a overnight oximetry. We will draw some labs today. ( CMET, CBC) Assess for chronic CO2 retention with CMET, and evaluate O&O for any episodes of apnea.( If positive will re-do split night study

## 2016-08-23 NOTE — Progress Notes (Signed)
History of Present Illness Michele Owens is a 56 y.o. female current smoker with obesity, OSA,severe  COPD per PFT 06/2015 and dCHF.She was treated  as an inpatient by Dr. Murlean Iba and Dr. Ashok Cordia.She wears home oxygen at night at 2 L.  History of present illness 56 year old female active smokerwith PMH of asthma, COPD (on 2L [place] at home, last seen by Dr. Halford Chessman 08/2015 for sleep study), Diastolic HF, DM, GERD, and HTN. Presents to ED 4/8 with reported one day history of dyspnea, wheezing, productive cough, and BLE edema. Upon arrival to ED patient was lethargic however, arousable and responsive to questions. CXR revealed right lower lobe opacity, WBC 18, and temp 100.2. ABG 7.320/75.7/58, patient was placed on BIPAP but ultimately required intubation for continued somnolence. She was admitted by CCM. She was intubated April 8 and deemed ready for extubation on April 12.She was discharged home 07/24/2016.  08/23/2016  Pt. Presents for follow up of recent hospitalization for Acute on Chronic hypoxemic/hypercarbic respiratory failure 2/2 Asthma/COPD exacerbation + Pulmonary edema/CHF exacerbation + CAP + OHS . She required intubation, and BiPAP. She was weaned to her 2 L nasal cannula, which is her baseline home oxygen requirement. (Patient wears nocturnal oxygen at 2 L, and states she wears it occasionally during the day when she feels she needs it.) She was treated with antibiotics, scheduled bronchodilators, oxygen, and IV steroids. Her acute grade 2 diastolic heart failure was treated with diuretics. She was deemed ready for discharge on 07/24/2016. She presents today for follow-up. She states she is doing well. She is using her oxygen every night.. She is using her Symbicort and Spiriva daily.She rarely uses her rescue inhaler. She is using her nebulizer treatments twice daily. She does have a cough, which she states is productive only in the morning. She states secretions are tan. She denies fever, chest pain,  orthopnea or hemoptysis. She has a new thyroid cancer that was diagnosed  this admission. She states she has not had follow-up. She has called Dr. Erin Fulling office while in the pulmonary office today. She will see Dr. Jonelle Sidle 08/24/2016 for follow up. .Dr. Ardelle Anton office is in the process of calling  [facility] for follow-up and treatment of this new finding.. The patient has the number to call if she does not hear from them in a timely manner. The patient has a nicotine patch on her arm today stating that she has not had a cigarette since she was admitted to the hospital. She states she is committed to remaining smoke free.  Test Results:  CXR : 08/23/2016 Cardiac shadow is mildly enlarged. Linear scarring is noted in the bases bilaterally. No focal infiltrate or sizable effusion is seen. No acute bony abnormality is noted. IMPRESSION: Linear scarring in the bases bilaterally  STUDIES:  CXR 4/8 > Right lower lobe opacity, suspicious for PNA, cardiomegaly with vascular congestion ECHO 4/8 >> LVEF 67-12%, grade 2 diastolic dysfunction, mild TR  CULTURES: MRSA PCR 4/9 >> negative Blood 4/8 >> (-) Sputum 4/8 >> (-) RVP 4/8 >> negative Strep p. 4/8 >> negative Legionella 4/8 >> negative  ANTIBIOTICS: Zosyn 4/8 in ED Vancomycin 4/8 in ED Azithromycin 4/8 >>4/12 Rocephin 4/8 >>  SIGNIFICANT EVENTS: 4/8 > Presents to ED with dyspnea > intubated  4/11 > Weaning; no cuff leak 4/12 > extubated  PFT's 06/2015 : Severe COPD Conclusions: Severe airway obstruction is present. The diffusion defect and reduced lung volumes suggest an early parenchymal process. A clinical  trial of bronchodilators may be beneficial in view of the airway obstruction. Pulmonary Function Diagnosis: Severe Obstructive Airways Disease Severe Restriction -Parenchymal Moderate Diffusion Defect  PSG 08/24/15 >> AHI 3.6, SpO2 low 80%.  Spent 13.6 min with SpO2 < 88%.  Needed 2 liters supplemental  oxygen.  CBC Latest Ref Rng & Units 08/23/2016 07/24/2016 07/23/2016  WBC 4.0 - 10.5 K/uL 17.9(H) 28.6(H) 25.3(H)  Hemoglobin 12.0 - 15.0 g/dL 11.5(L) 11.2(L) 11.1(L)  Hematocrit 36.0 - 46.0 % 37.1 37.5 36.8  Platelets 150.0 - 400.0 K/uL 352.0 466(H) 447(H)    BMP Latest Ref Rng & Units 08/23/2016 08/07/2016 08/01/2016  Glucose 70 - 99 mg/dL 93 113(H) 121(H)  BUN 6 - 23 mg/dL 7 5(L) 7  Creatinine 0.40 - 1.20 mg/dL 0.52 0.47 0.64  Sodium 135 - 145 mEq/L 137 139 138  Potassium 3.5 - 5.1 mEq/L 4.1 3.5 2.7(LL)  Chloride 96 - 112 mEq/L 99 100(L) 101  CO2 19 - 32 mEq/L 32 31 25  Calcium 8.4 - 10.5 mg/dL 9.5 8.7(L) 8.8(L)    BNP    Component Value Date/Time   BNP 9.7 08/01/2016 1536   BNP 6.5 05/25/2015 1202    ProBNP    Component Value Date/Time   PROBNP 22.0 03/19/2012 1201    PFT    Component Value Date/Time   FEV1PRE 1.16 07/07/2015 1300   FEV1POST 1.20 07/07/2015 1300   FVCPRE 1.59 07/07/2015 1300   FVCPOST 1.43 07/07/2015 1300   TLC 3.06 07/07/2015 1300   DLCOUNC 14.83 07/07/2015 1300   PREFEV1FVCRT 73 07/07/2015 1300   PSTFEV1FVCRT 84 07/07/2015 1300      Past medical hx Past Medical History:  Diagnosis Date  . Anxiety   . Arthritis   . Asthma   . Chronic diastolic CHF (congestive heart failure) ([place])   . COPD (chronic obstructive pulmonary disease) ([facility])    Uses Oxygen at night  . Depression   . Diabetes mellitus without complication ([place])   . GERD (gastroesophageal reflux disease)   . Gout   . Headache    migraines  . History of DVT of lower extremity   . History of nuclear stress test    Myoview 2/17:  Low risk stress nuclear study with a small, moderate intensity, partially reversible inferior lateral defect consistent with small prior infarct and minimal peri-infarct ischemia; EF 68 with normal wall motion  . Hypertension   . Pneumonia      Social History  Substance Use Topics  . Smoking status: Former Smoker    Packs/day: 0.50    Years: 41.00     Types: Cigarettes    Quit date: 07/2016  . Smokeless tobacco: Never Used     Comment: Starting to [employer] -- using Nicotine Patch  . Alcohol use 0.0 oz/week     Comment: may have a mixed drink once in a blue moon    Tobacco Cessation: I have spent 4-5  minutes counseling patient on smoking cessation this visit. We discussed the risks of continued tobacco use, and the benefits of smoking cessation to her overall health.  Past surgical hx, Family hx, Social hx all reviewed.  Current Outpatient Prescriptions on File Prior to Visit  Medication Sig  . acetaminophen (TYLENOL) 325 MG tablet Take 2 tablets (650 mg total) by mouth every 4 (four) hours as needed for headache or mild pain.  . Acetaminophen-Codeine (TYLENOL/CODEINE #3) 300-30 MG tablet Take 1 tablet by mouth every 6 (six) hours as needed for pain.  Marland Kitchen  albuterol (ACCUNEB) 1.25 MG/3ML nebulizer solution Take 1 ampule by nebulization 2 (two) times daily.  Marland Kitchen albuterol (PROVENTIL HFA;VENTOLIN HFA) 108 (90 BASE) MCG/ACT inhaler Inhale 1-2 puffs into the lungs every 6 (six) hours as needed for wheezing.  Marland Kitchen allopurinol (ZYLOPRIM) 100 MG tablet Take 100 mg by mouth daily.  . benzonatate (TESSALON) 200 MG capsule Take 1 capsule (200 mg total) by mouth 3 (three) times daily as needed for cough.  . budesonide-formoterol (SYMBICORT) 80-4.5 MCG/ACT inhaler Inhale 2 puffs into the lungs 2 (two) times daily.  . carvedilol (COREG) 6.25 MG tablet TAKE ONE TABLET BY MOUTH TWICE DAILY WITH A MEAL  . clonazePAM (KLONOPIN) 2 MG tablet Take 1 tablet (2 mg total) by mouth 2 (two) times daily. (Patient taking differently: Take 2 mg by mouth 3 (three) times daily. )  . colchicine 0.6 MG tablet Take 0.6 mg by mouth 2 (two) times daily.  . diclofenac sodium (VOLTAREN) 1 % GEL Apply 2 g topically 4 (four) times daily as needed (for pain).   Marland Kitchen dicyclomine (BENTYL) 20 MG tablet Take 1 tablet (20 mg total) by mouth 2 (two) times daily as needed (abdominal  pain/cramping).  . EPINEPHrine 0.3 mg/0.3 mL IJ SOAJ injection Inject 0.3 mg into the muscle daily as needed (allergic reaction).  . furosemide (LASIX) 40 MG tablet Take 2 tablets (80 mg total) by mouth 2 (two) times daily.  Marland Kitchen HYDROcodone-acetaminophen (NORCO/VICODIN) 5-325 MG tablet take 1 tablet by mouth every 12 hours WHEN NECESSARY PAIN (MUST LAST 2 WEEKS); Pt received 28 pills total  . lisinopril (PRINIVIL,ZESTRIL) 5 MG tablet Take 1 tablet (5 mg total) by mouth daily.  Marland Kitchen loratadine (CLARITIN) 10 MG tablet Take 10 mg by mouth daily as needed for allergies.   . metFORMIN (GLUCOPHAGE) 500 MG tablet Take 1 tablet (500 mg total) by mouth 2 (two) times daily with a meal.  . mirtazapine (REMERON) 15 MG tablet Take 15 mg by mouth at bedtime.  . nicotine (NICODERM CQ - DOSED IN MG/24 HOURS) 14 mg/24hr patch Place 1 patch (14 mg total) onto the skin daily.  Marland Kitchen omeprazole (PRILOSEC) 40 MG capsule Take 40 mg by mouth daily.  . potassium chloride SA (K-DUR,KLOR-CON) 20 MEQ tablet Take 2 tablets (40 mEq total) by mouth daily.  . pravastatin (PRAVACHOL) 40 MG tablet Take 40 mg by mouth daily.   Marland Kitchen tiotropium (SPIRIVA) 18 MCG inhalation capsule Place 18 mcg into inhaler and inhale daily.   No current facility-administered medications on file prior to visit.      Allergies  Allergen Reactions  . Bee Venom Swelling and Other (See Comments)    "All over my body" (swelling)  . Ibuprofen Rash    Severe rash  . Lamisil [Terbinafine Hcl] Rash and Other (See Comments)    Pt states this causes her to "feel funny"  . Nsaids Other (See Comments)    Per MD's orders     Review Of Systems:  Constitutional:   No  weight loss, night sweats,  Fevers, chills, fatigue, or  lassitude.  HEENT:   No headaches,  Difficulty swallowing,  Tooth/dental problems, or  Sore throat,                No sneezing, itching, ear ache, nasal congestion, post nasal drip,   CV:  No chest pain,  Orthopnea, PND, swelling in lower  extremities, anasarca, dizziness, palpitations, syncope.   GI  No heartburn, indigestion, abdominal pain, nausea, vomiting, diarrhea, change in bowel  habits, loss of appetite, bloody stools.   Resp: No shortness of breath with exertion or at rest.  No excess mucus, occasional productive cough,  No non-productive cough,  No coughing up of blood.  No change in color of mucus.  No wheezing.  No chest wall deformity  Skin: no rash or lesions.  GU: no dysuria, change in color of urine, no urgency or frequency.  No flank pain, no hematuria   MS:  No joint pain or swelling.  No decreased range of motion.  No back pain.  Psych:  No change in mood or affect. No depression or anxiety.  No memory loss.   Vital Signs BP 128/80 (BP Location: Right Arm, Cuff Size: Normal)   Pulse 99   Ht 5\' 5"  (1.651 m)   Wt 282 lb 9.6 oz (128.2 kg)   SpO2 92%   BMI 47.03 kg/m    Physical Exam:  General- No distress,  A&Ox3, pleasant, but has a hard time focusing. ENT: No sinus tenderness, TM clear, pale nasal mucosa, no oral exudate,no post nasal drip, no LAN Cardiac: S1, S2, regular rate and rhythm, no murmur Chest: No wheeze/ rales/ dullness; no accessory muscle use, no nasal flaring, no sternal retractions, diminished per bases. Abd.: Soft Non-tender, obese Ext: No clubbing cyanosis, trace LE edema Neuro:  normal strength, moving all extremities 4, alert and oriented 3. Skin: No rashes, warm and dry Psych: Difficulty focusing today, easily agitated   Assessment/Plan CAP Recent admission for Acute on Chronic hypoxemic/hypercarbic respiratory failure 2/2 Asthma/COPD exacerbation + Pulmonary edema/CHF exacerbation + CAP + OHS . She required intubation, and BiPAP.  Plan: CXR today. We will call with results. Continue using your Spiriva and Symbicort daily as you have been doing. Rinse mouth after use. Continue using your nebulizer treatments as you have been doing. Continue wearing your oxygen at 2  L every night. Follow up in 4 -6 weeks with NP or Dr. Halford Chessman Please contact office for sooner follow up if symptoms do not improve or worsen or seek emergency care   Tobacco Abuse Counseled extensively to remain smoke free Has nicoderm patch on in the office today Plan: Continue to remain smoke free Use Nicoderm patch as directed in the hospital Call us for assistance if you feel you need to smoke again.  OSA Patient has previously failed CPAP Most recent sleep study indicated AHI 3.6, SpO2 low 80%.  Spent 13.6 min with SpO2 < 88%.  Needed 2 liters supplemental oxygen. Recent hospitalization for acute on chronic hypoxemic/hypercarbic respiratory failure Required intubation and BiPAP Plan We will schedule you for a overnight oximetry. We will draw some labs today. ( CMET, CBC) Assess for chronic CO2 retention with CMET, and evaluate O&O for any episodes of apnea.( If positive will re-do split night study    Magdalen Spatz, NP 08/23/2016  8:14 PM

## 2016-08-23 NOTE — Progress Notes (Signed)
She presents today complaining of painful elongated toenails.  Objective: Both signs are stable she's alert and oriented 3 toenails elongated thickened dystrophic with mycotic painful palpation.  Assessment: A limbs in a onychomycosis.  Plan: Debridement toenails bilateral.

## 2016-08-24 NOTE — Telephone Encounter (Signed)
Spoke with patient, aware that she will get a call from a DME within the next week regarding her ONO. Pt aware to contact our office Monday if not heard from DME. Nothing further needed.

## 2016-08-25 NOTE — Telephone Encounter (Signed)
lmomtcb x 2 for the pt.  

## 2016-08-28 ENCOUNTER — Telehealth: Payer: Self-pay | Admitting: Acute Care

## 2016-08-28 NOTE — Telephone Encounter (Signed)
lmtcb X1 for pt- see previously close telephone note

## 2016-08-28 NOTE — Telephone Encounter (Signed)
lmomtcb x 3 for the pt to call us back.  Will close this message per protocol.

## 2016-08-29 NOTE — Telephone Encounter (Signed)
Patient called back.  Asked "why can I see my thyroid moving around".  Advised I wasn't a nurse or a doctor so I couldn't answer this for her.  Call back at [phone].

## 2016-08-29 NOTE — Telephone Encounter (Signed)
LMTCB

## 2016-08-29 NOTE — Telephone Encounter (Signed)
lmtcb for pt.  

## 2016-08-29 NOTE — Telephone Encounter (Signed)
Called and spoke to pt. Pt questioning when her appt was for her thyroid, advised her that was with another office. Per pt's chart she has pre-admission testing on 09/13/2016 and a future thyroidectomy. Pt is requesting the number to the provider's office doing her thyroidectomy, gave pt the number to Dr. Tera Helper office. Pt verbalized understanding and denied any further questions or concerns at this time.

## 2016-08-29 NOTE — Telephone Encounter (Signed)
Pt returning call.Michele Owens ° °

## 2016-08-31 ENCOUNTER — Inpatient Hospital Stay (HOSPITAL_COMMUNITY): Admission: RE | Admit: 2016-08-31 | Payer: Medicaid Other | Source: Ambulatory Visit

## 2016-09-13 ENCOUNTER — Encounter (HOSPITAL_COMMUNITY): Payer: Self-pay

## 2016-09-13 ENCOUNTER — Encounter (HOSPITAL_COMMUNITY)
Admission: RE | Admit: 2016-09-13 | Discharge: 2016-09-13 | Disposition: A | Discharge status: Home / Self Care | Payer: Medicaid Other | Source: Ambulatory Visit | Attending: Surgery | Admitting: Surgery

## 2016-09-13 DIAGNOSIS — D440 Neoplasm of uncertain behavior of thyroid gland: Secondary | ICD-10-CM | POA: Diagnosis not present

## 2016-09-13 DIAGNOSIS — Z01818 Encounter for other preprocedural examination: Secondary | ICD-10-CM | POA: Insufficient documentation

## 2016-09-13 HISTORY — DX: Dyspnea, unspecified: R06.00

## 2016-09-13 LAB — BASIC METABOLIC PANEL
ANION GAP: 11 (ref 5–15)
BUN: 7 mg/dL (ref 6–20)
CHLORIDE: 100 mmol/L — AB (ref 101–111)
CO2: 27 mmol/L (ref 22–32)
Calcium: 9.2 mg/dL (ref 8.9–10.3)
Creatinine, Ser: 0.51 mg/dL (ref 0.44–1.00)
GFR calc non Af Amer: >60 mL/min (ref >60)
Glucose, Bld: 109 mg/dL — ABNORMAL HIGH (ref 65–99)
POTASSIUM: 4.2 mmol/L (ref 3.5–5.1)
SODIUM: 138 mmol/L (ref 135–145)

## 2016-09-13 LAB — GLUCOSE, CAPILLARY: GLUCOSE-CAPILLARY: 132 mg/dL — AB (ref 65–99)

## 2016-09-13 LAB — CBC
HEMATOCRIT: 36 % (ref 36.0–46.0)
HEMOGLOBIN: 10.6 g/dL — AB (ref 12.0–15.0)
MCH: 26 pg (ref 26.0–34.0)
MCHC: 29.4 g/dL — AB (ref 30.0–36.0)
MCV: 88.2 fL (ref 78.0–100.0)
Platelets: 295 10*3/uL (ref 150–400)
RBC: 4.08 MIL/uL (ref 3.87–5.11)
RDW: 16.9 % — ABNORMAL HIGH (ref 11.5–15.5)
WBC: 14.5 10*3/uL — ABNORMAL HIGH (ref 4.0–10.5)

## 2016-09-13 NOTE — Pre-Procedure Instructions (Addendum)
Michele Owens  09/13/2016      [place], [place] - [address] [place] [address] Phone: [phone] Fax: [phone]  [place], [place], [place] [facility] [place] [address] Phone: [phone] Fax: [phone]    Your procedure is scheduled on 09/19/16.  Report to [facility] Admitting at 530 A.M.  Call this number if you have problems the morning of surgery:  [phone]   Remember:  Do not eat food or drink liquids after midnight.  Take these medicines the morning of surgery with A SIP OF WATER        tylenol if needed,inhalers if needed(bring albuterol inhaler), nebulizer if needed,allopurinol, colchicine,carvedilol(coreg),omeprazole(prilosec), clonazepam if needed  STOP all herbel meds, nsaids (aleve,naproxen,advil,ibuprofen)   prior to surgery starting Today 09/13/16 including all vitamins/supplements.aspirin,diclofenac  No metformin day of surgery   How to Manage Your Diabetes Before and After Surgery  Why is it important to control my blood sugar before and after surgery? . Improving blood sugar levels before and after surgery helps healing and can limit problems. . A way of improving blood sugar control is eating a healthy diet by: o  Eating less sugar and carbohydrates o  Increasing activity/exercise o  Talking with your doctor about reaching your blood sugar goals . High blood sugars (greater than 180 mg/dL) can raise your risk of infections and slow your recovery, so you will need to focus on controlling your diabetes during the weeks before surgery. . Make sure that the doctor who takes care of your diabetes knows about your planned surgery including the date and location.  How do I manage my blood sugar before surgery? . Check your blood sugar at least 4 times a day, starting 2 days before surgery, to make sure that the  level is not too high or low. o Check your blood sugar the morning of your surgery when you wake up and every 2 hours until you get to the Short Stay unit. . If your blood sugar is less than 70 mg/dL, you will need to treat for low blood sugar: o Do not take insulin. o Treat a low blood sugar (less than 70 mg/dL) with  cup of clear juice (cranberry or apple), 4 glucose tablets, OR glucose gel. o Recheck blood sugar in 15 minutes after treatment (to make sure it is greater than 70 mg/dL). If your blood sugar is not greater than 70 mg/dL on recheck, call [phone] for further instructions. . Report your blood sugar to the short stay nurse when you get to Short Stay.  . If you are admitted to the hospital after surgery: o Your blood sugar will be checked by the staff and you will probably be given insulin after surgery (instead of oral diabetes medicines) to make sure you have good blood sugar levels. o The goal for blood sugar control after surgery is 80-180 mg/dL.   WHAT DO I DO ABOUT MY DIABETES MEDICATION?   Marland Kitchen Do not take oral diabetes medicines (pills) the morning of surgery.(no metformin)      Do not wear jewelry, make-up or nail polish.  Do not wear lotions, powders, or perfumes, or deoderant.  Do not shave 48 hours prior to surgery.  Men may shave face and neck.  Do not bring valuables to the hospital.  [facility] is not responsible for any belongings or valuables.  Contacts, dentures or bridgework may not be worn into surgery.  Leave your suitcase in the car.  After surgery it may be brought to your room.  For patients admitted to the hospital, discharge time will be determined by your treatment team.  Patients discharged the day of surgery will not be allowed to drive home.   Special instructions:  Special Instructions: [place] - Preparing for Surgery  Before surgery, you can play an important role.  Because skin is not sterile, your skin needs to be as free of  germs as possible.  You can reduce the number of germs on you skin by washing with CHG (chlorahexidine gluconate) soap before surgery.  CHG is an antiseptic cleaner which kills germs and bonds with the skin to continue killing germs even after washing.  Please DO NOT use if you have an allergy to CHG or antibacterial soaps.  If your skin becomes reddened/irritated stop using the CHG and inform your nurse when you arrive at Short Stay.  Do not shave (including legs and underarms) for at least 48 hours prior to the first CHG shower.  You may shave your face.  Please follow these instructions carefully:   1.  Shower with CHG Soap the night before surgery and the morning of Surgery.  2.  If you choose to wash your hair, wash your hair first as usual with your normal shampoo.  3.  After you shampoo, rinse your hair and body thoroughly to remove the Shampoo.  4.  Use CHG as you would any other liquid soap.  You can apply chg directly  to the skin and wash gently with scrungie or a clean washcloth.  5.  Apply the CHG Soap to your body ONLY FROM THE NECK DOWN.  Do not use on open wounds or open sores.  Avoid contact with your eyes ears, mouth and genitals (private parts).  Wash genitals (private parts)       with your normal soap.  6.  Wash thoroughly, paying special attention to the area where your surgery will be performed.  7.  Thoroughly rinse your body with warm water from the neck down.  8.  DO NOT shower/wash with your normal soap after using and rinsing off the CHG Soap.  9.  Pat yourself dry with a clean towel.            10.  Wear clean pajamas.            11.  Place clean sheets on your bed the night of your first shower and do not sleep with pets.  Day of Surgery  Do not apply any lotions/deodorants the morning of surgery.  Please wear clean clothes to the hospital/surgery center.  Please read over the  fact sheets that you were given.

## 2016-09-14 LAB — HEMOGLOBIN A1C
HEMOGLOBIN A1C: 5.9 % — AB (ref 4.8–5.6)
MEAN PLASMA GLUCOSE: 123 mg/dL

## 2016-09-14 NOTE — Progress Notes (Signed)
Anesthesia Chart Review: Patient is a 56 year old female scheduled for total thyroidectomy on 09/19/16 by Dr. [name].  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 BMI is consistent with morbid obesity.  - Admission 07/16/16-07/24/16 for Acute on chronic hypoxemic/hypercarbic respiratory failure, leukocytosis, RLL CAP, COPD excerabation, acute on chronic diastolic CHF, s/p BiPAP-->intubation (extubated 07/20/16).  She was treated with antibiotics, scheduled bronchodilators, oxygen, and IV steroids.   - PCP is Dr. Barbette Merino. - Cardiologist is Dr. Aundra Dubin ([facility]), last seen 08/01/16. Volume status was felt stable. One month follow-up with PA/NP recommended. He did refer patient to oncology for finding of thyroid cancer. There is a handwritten notation (scanned under Media tab) from Dr. Aundra Dubin dated 08/04/16 stating, "Ok for surgery, higher risk due to COPD." - Pulmonologist is Dr. Murlean Iba and Dr. Ashok Cordia, last visit with Bartholomew Crews, NP on 08/23/16.  Meds include albuterol, allopurinol, Symbicort, Coreg, clonazepam, colchicine, Bentyl, Lasix, lisinopril, Claritin, metformin, Remeron, NicoDerm, Prilosec, KCl, pravastatin, Spiriva.  BP 116/76   Pulse 95   Temp 36.6 C   Resp 20   Ht 5\' 5"  (1.651 m)   Wt 290 lb (131.5 kg)   SpO2 94%   BMI 48.26 kg/m   EKG 07/16/16: SR, low voltage, precordial leads.  Echo 07/17/16: Study Conclusions - Left ventricle: The cavity size was normal. Systolic function was   mildly reduced. The estimated ejection fraction was in the range   of 45% to 50%. Diffuse hypokinesis. Features are consistent with   a pseudonormal left ventricular filling pattern, with concomitant  abnormal relaxation and increased filling pressure (grade 2   diastolic dysfunction). Doppler parameters are consistent with   elevated ventricular end-diastolic filling pressure. - Aortic valve: There was no regurgitation. - Left atrium: The atrium was normal in size. - Right ventricle: The cavity size was moderately dilated. Wall   thickness was normal. Systolic function was moderately reduced. - Tricuspid valve: There was mild regurgitation. - Pulmonary arteries: Systolic pressure was within the normal   range. - Inferior vena cava: The vessel was normal in size. - Pericardium, extracardiac: There was no pericardial effusion. (Comparison echo 12/08/14: EF 55-60%, no regional wall motion abnormalities, grade 2 diastolic dysfunction.)  RHC/LHC 06/23/15 (done due to partially reversible inferolateral defect 06/03/15 stress test): 1. Mildly elevated right and left heart filling pressures.  2. Mild pulmonary venous hypertension.  3. No significant coronary disease.  4. Normal LV systolic function.   Event monitor 03/24/13-03/25/13: SR.  Thyroid U/S 01/24/16: IMPRESSION: - Multi nodular goiter. - Nodules #1 and #3 in the right thyroid lobe both meet criteria for biopsy. - Nodule #2 in the right thyroid lobe and Nodule #4 in left thyroid lobe both meet criteria for 1 year follow-up.  Chest CT 01/23/16: IMPRESSION: 1. Enlarged pulmonary arteries and veins bilaterally most compatible with pulmonary vascular congestion. Recommend follow-up chest x-ray to assure resolution of findings. If the chest x-ray does not resolve, follow-up CT the chest with contrast recommended in 1 months time to exclude any underlying adenopathy or mass lesion obscured by the congestion. 2. Bilateral areas of linear atelectasis and interstitial edema compatible with congestive heart failure. 3. Multinodular thyroid goiter. The dominant 3 cm nodule is present within the right lobe of the thyroid Recommend  further  evaluation with thyroid ultrasound. If patient is clinically hyperthyroid, consider nuclear medicine thyroid uptake and scan.  CXR 08/23/16: IMPRESSION: Linear scarring in the bases bilaterally  PSG 08/24/15:AHI 3.6, SpO2 low 80%. Spent 13.6 min with SpO2 <88%. Needed 2 liters supplemental oxygen.  PFTs 07/07/15: FVC 1.59 (52%), FEV1 1.16 (48%), DLCO unc 14.83 (55%). Severe obstructive airways disease. Severe restriction - parenchymal. Moderate diffusion defect. Conclusions: Severe airway obstruction is present. The diffusion defect and reduced lung volumes suggest an early parenchymal process. A clinical trial of bronchodilators may be beneficial in view of the airway obstruction.  Preoperative labs noted. WBC 14.5 (down from 17.9 on 08/23/16), H/H 10.6/36.0. Cr 0.51. Glucose 109. A1c 5.9. TSH 01/23/16 was 0.676. WBC result routed to Dr. [name].   Patient with recent cardiology and pulmonology evaluations. Cardiology did provide preoperative risk assessment. She will be further evaluated on the day of surgery to ensure no acute cardiopulmonary issues/symptoms prior to proceeding.   George Hugh [facility] Short Stay Center/Anesthesiology Phone [phone] 09/14/2016 5:06 PM

## 2016-09-18 ENCOUNTER — Encounter (HOSPITAL_COMMUNITY): Payer: Self-pay | Admitting: Surgery

## 2016-09-18 DIAGNOSIS — D440 Neoplasm of uncertain behavior of thyroid gland: Secondary | ICD-10-CM | POA: Diagnosis present

## 2016-09-18 MED ORDER — DEXTROSE 5 % IV SOLN
3.0000 g | INTRAVENOUS | Status: AC
  Administered 2016-09-19: 3 g via INTRAVENOUS
  Filled 2016-09-18 (×2): qty 3000

## 2016-09-18 NOTE — H&P (Signed)
General Surgery [facility]  Michele Owens DOB: 05/26/1960 Single / Language: Michele Owens / Race: Black or African American Female  History of Present Illness  The patient is a 56 year old female who presents with thyroid cancer.  Patient returns for follow-up of multiple thyroid nodules and thyroid neoplasm of uncertain behavior. Patient has a nodule in the upper pole of the right thyroid lobe for which previous fine-needle aspiration biopsy showed it to have atypia. We requested a repeat biopsy in order to obtain molecular genetic testing. However the biopsy performed on June 20, 2016 shows findings suspicious for papillary thyroid carcinoma, Bethesda category V. Therefore molecular genetic testing is not necessary. The recommendation is to proceed with total thyroidectomy for definitive diagnosis and management. Patient presents today to discuss these findings and to review the procedure of total thyroidectomy. Patient is very upset today as she has had a close family member passed away in the recent days. We conducted most of her discussion today while on the speaker phone with her daughter.  Allergies  NSAIDs  Rash. LamISIL *ANTIFUNGALS*  Hives.  Medication History Voltaren (1% Gel, Transdermal daily) Active. Medications Reconciled Fluconazole (150MG  Tablet, Oral) Active.  Vitals Weight: 278 lb Temp.: 97.51F(Oral)  Pulse: 94 (Regular)  BP: 124/82 (Sitting, Left Arm, Standard)  Physical Exam  The physical exam findings are as follows: Note:General - appears anxious, wearing a surgical mask and oxygen by nasal cannula  HEENT - normocephalic; sclerae clear, gaze conjugate; mucous membranes moist, dentition fair; voice normal  Neck - symmetric on extension; no palpable anterior or posterior cervical adenopathy; palpation of the neck is limited by patient discomfort, is telling me that she has pain since her biopsy was performed last  week  Chest - clear bilaterally without rhonchi, rales, or wheeze  Cor - regular rhythm with normal rate; no significant murmur  Neuro - grossly intact; no tremor    Assessment & Plan  MULTIPLE THYROID NODULES (E04.2) NEOPLASM OF UNCERTAIN BEHAVIOR OF THYROID GLAND (D44.0)  Patient presents today to discuss findings firm fine-needle aspiration biopsy performed last week. She has a nodule in the right lobe of the thyroid gland which is highly suspicious for papillary thyroid carcinoma. She has multiple other thyroid nodules present.  I have recommended proceeding with total thyroidectomy. I discussed this today with the patient as well as with her daughter by telephone. We discussed the location of the surgical incision, the hospital stay to be anticipated, the likelihood of cure, and potential complications including injury to parathyroid glands and injury to recurrent laryngeal nerves. Patient understands and wishes to proceed with surgery in the near future.  Patient will require clearance by her cardiologist, Dr. Loralie Owens. She will also require medical clearance by her primary care physician, Dr. Jonelle Owens. We will send those requests today. Once she has received cardiac clearance and medical clearance, we will proceed with scheduling her for total thyroidectomy.  The risks and benefits of the procedure have been discussed at length with the patient. The patient understands the proposed procedure, potential alternative treatments, and the course of recovery to be expected. All of the patient's questions have been answered at this time. The patient wishes to proceed with surgery.  Michele Regal, MD, [facility] Office: [phone]

## 2016-09-18 NOTE — Anesthesia Preprocedure Evaluation (Addendum)
Anesthesia Evaluation  Patient identified by MRN, date of birth, ID band Patient awake    Reviewed: Allergy & Precautions, NPO status , Patient's Chart, lab work & pertinent test results, reviewed documented beta blocker date and time   Airway Mallampati: III  TM Distance: >3 FB Neck ROM: Full    Dental  (+) Teeth Intact, Dental Advisory Given   Pulmonary asthma , sleep apnea and Oxygen sleep apnea , COPD,  COPD inhaler and oxygen dependent, former smoker (Quit 07/2016),    breath sounds clear to auscultation       Cardiovascular hypertension, Pt. on medications and Pt. on home beta blockers +CHF  (-) pacemaker Rhythm:Regular Rate:Bradycardia     Neuro/Psych  Headaches, PSYCHIATRIC DISORDERS Anxiety Depression    GI/Hepatic Neg liver ROS, GERD  Medicated,  Endo/Other  diabetes, Type 2, Oral Hypoglycemic AgentsMorbid obesity  Renal/GU negative Renal ROS  negative genitourinary   Musculoskeletal  (+) Arthritis , Osteoarthritis,    Abdominal   Peds negative pediatric ROS (+)  Hematology negative hematology ROS (+)   Anesthesia Other Findings Gout  DVT  Reproductive/Obstetrics negative OB ROS                            08/2015 EKG: NSR  11/2014 Echo - Left ventricle: The cavity size was normal. Systolic function was   normal. The estimated ejection fraction was in the range of 55%   to 60%. Wall motion was normal; there were no regional wall   motion abnormalities. Features are consistent with a pseudonormal   left ventricular filling pattern, with concomitant abnormal   relaxation and increased filling pressure (grade 2 diastolic   dysfunction). Doppler parameters are consistent with high   ventricular filling pressure. - Aortic valve: Valve area (VTI): 2 cm^2. Valve area (Vmax): 2.04   cm^2. Valve area (Vmean): 1.88 cm^2. - Tricuspid valve: There was trivial regurgitation.  Anesthesia  Physical  Anesthesia Plan  ASA: IV  Anesthesia Plan: General   Post-op Pain Management:    Induction: Intravenous  PONV Risk Score and Plan: 3 and Ondansetron, Dexamethasone, Propofol and Midazolam  Airway Management Planned: Oral ETT  Additional Equipment:   Intra-op Plan:   Post-operative Plan: Extubation in OR and Possible Post-op intubation/ventilation  Informed Consent: I have reviewed the patients History and Physical, chart, labs and discussed the procedure including the risks, benefits and alternatives for the proposed anesthesia with the patient or authorized representative who has indicated his/her understanding and acceptance.   Dental advisory given and Dental Advisory Given  Plan Discussed with: CRNA and Surgeon  Anesthesia Plan Comments:       Anesthesia Quick Evaluation

## 2016-09-19 ENCOUNTER — Encounter (HOSPITAL_COMMUNITY): Payer: Self-pay | Admitting: Surgery

## 2016-09-19 ENCOUNTER — Ambulatory Visit (HOSPITAL_COMMUNITY): Payer: Medicaid Other | Admitting: Vascular Surgery

## 2016-09-19 ENCOUNTER — Ambulatory Visit (HOSPITAL_COMMUNITY): Payer: Medicaid Other | Admitting: Anesthesiology

## 2016-09-19 ENCOUNTER — Observation Stay (HOSPITAL_COMMUNITY)
Admission: RE | Admit: 2016-09-19 | Discharge: 2016-09-20 | Disposition: A | Discharge status: Home / Self Care | Payer: Medicaid Other | Source: Ambulatory Visit | Attending: Surgery | Admitting: Surgery

## 2016-09-19 ENCOUNTER — Encounter (HOSPITAL_COMMUNITY)
Admission: RE | Disposition: A | Discharge status: Home / Self Care | Payer: Self-pay | Source: Ambulatory Visit | Attending: Surgery

## 2016-09-19 DIAGNOSIS — E890 Postprocedural hypothyroidism: Secondary | ICD-10-CM | POA: Insufficient documentation

## 2016-09-19 DIAGNOSIS — D440 Neoplasm of uncertain behavior of thyroid gland: Secondary | ICD-10-CM

## 2016-09-19 DIAGNOSIS — C73 Malignant neoplasm of thyroid gland: Principal | ICD-10-CM | POA: Insufficient documentation

## 2016-09-19 DIAGNOSIS — D489 Neoplasm of uncertain behavior, unspecified: Secondary | ICD-10-CM | POA: Diagnosis present

## 2016-09-19 HISTORY — PX: THYROIDECTOMY: SHX17

## 2016-09-19 LAB — GLUCOSE, CAPILLARY
GLUCOSE-CAPILLARY: 98 mg/dL (ref 65–99)
GLUCOSE-CAPILLARY: 123 mg/dL — AB (ref 65–99)
GLUCOSE-CAPILLARY: 115 mg/dL — AB (ref 65–99)
Glucose-Capillary: 130 mg/dL — ABNORMAL HIGH (ref 65–99)
Glucose-Capillary: 126 mg/dL — ABNORMAL HIGH (ref 65–99)

## 2016-09-19 SURGERY — THYROIDECTOMY
Anesthesia: General | Site: Neck

## 2016-09-19 MED ORDER — CALCIUM CARBONATE 1250 (500 CA) MG PO TABS
2.0000 | ORAL_TABLET | Freq: Three times a day (TID) | ORAL | Status: DC
Start: 2016-09-19 — End: 2016-09-20
  Administered 2016-09-19 – 2016-09-20 (×2): 1000 mg via ORAL
  Filled 2016-09-19 (×2): qty 1

## 2016-09-19 MED ORDER — ALBUTEROL SULFATE HFA 108 (90 BASE) MCG/ACT IN AERS: 1.0000 | INHALATION_SPRAY | Freq: Four times a day (QID) | RESPIRATORY_TRACT | Status: DC | PRN

## 2016-09-19 MED ORDER — COLCHICINE 0.6 MG PO TABS
0.6000 mg | ORAL_TABLET | Freq: Two times a day (BID) | ORAL | Status: DC
  Administered 2016-09-19 – 2016-09-20 (×2): 0.6 mg via ORAL
  Filled 2016-09-19 (×2): qty 1

## 2016-09-19 MED ORDER — MIDAZOLAM HCL 2 MG/2ML IJ SOLN
INTRAMUSCULAR | Status: AC
  Filled 2016-09-19: qty 2

## 2016-09-19 MED ORDER — INSULIN ASPART 100 UNIT/ML ~~LOC~~ SOLN: 0.0000 [IU] | Freq: Three times a day (TID) | SUBCUTANEOUS | Status: DC

## 2016-09-19 MED ORDER — ALLOPURINOL 100 MG PO TABS
100.0000 mg | ORAL_TABLET | Freq: Every day | ORAL | Status: DC
  Administered 2016-09-20: 100 mg via ORAL
  Filled 2016-09-19: qty 1

## 2016-09-19 MED ORDER — HYDROMORPHONE HCL 1 MG/ML IJ SOLN
0.2500 mg | INTRAMUSCULAR | Status: DC | PRN
  Administered 2016-09-19 (×2): 0.5 mg via INTRAVENOUS

## 2016-09-19 MED ORDER — OXYCODONE HCL 5 MG PO TABS
ORAL_TABLET | ORAL | Status: AC
  Filled 2016-09-19: qty 1

## 2016-09-19 MED ORDER — PROPOFOL 10 MG/ML IV BOLUS
INTRAVENOUS | Status: DC | PRN
Start: 2016-09-19 — End: 2016-09-19
  Administered 2016-09-19: 150 mg via INTRAVENOUS

## 2016-09-19 MED ORDER — ROCURONIUM BROMIDE 100 MG/10ML IV SOLN
INTRAVENOUS | Status: DC | PRN
  Administered 2016-09-19: 70 mg via INTRAVENOUS

## 2016-09-19 MED ORDER — SUGAMMADEX SODIUM 500 MG/5ML IV SOLN
INTRAVENOUS | Status: DC | PRN
  Administered 2016-09-19: 500 mg via INTRAVENOUS

## 2016-09-19 MED ORDER — OXYCODONE HCL 5 MG/5ML PO SOLN: 5.0000 mg | Freq: Once | ORAL | Status: AC | PRN

## 2016-09-19 MED ORDER — MIDAZOLAM HCL 5 MG/5ML IJ SOLN
INTRAMUSCULAR | Status: DC | PRN
  Administered 2016-09-19: 2 mg via INTRAVENOUS

## 2016-09-19 MED ORDER — HEMOSTATIC AGENTS (NO CHARGE) OPTIME
TOPICAL | Status: DC | PRN
  Administered 2016-09-19: 1 via TOPICAL

## 2016-09-19 MED ORDER — CHLORHEXIDINE GLUCONATE CLOTH 2 % EX PADS: 6.0000 | MEDICATED_PAD | Freq: Once | CUTANEOUS | Status: DC

## 2016-09-19 MED ORDER — TIOTROPIUM BROMIDE MONOHYDRATE 18 MCG IN CAPS
18.0000 ug | ORAL_CAPSULE | Freq: Every day | RESPIRATORY_TRACT | Status: DC
  Administered 2016-09-19 – 2016-09-20 (×2): 18 ug via RESPIRATORY_TRACT
  Filled 2016-09-19 (×2): qty 5

## 2016-09-19 MED ORDER — PROPOFOL 10 MG/ML IV BOLUS
INTRAVENOUS | Status: AC
  Filled 2016-09-19: qty 20

## 2016-09-19 MED ORDER — ONDANSETRON HCL 4 MG/2ML IJ SOLN
INTRAMUSCULAR | Status: DC | PRN
  Administered 2016-09-19: 4 mg via INTRAVENOUS

## 2016-09-19 MED ORDER — ALBUTEROL SULFATE (2.5 MG/3ML) 0.083% IN NEBU: 2.5000 mg | INHALATION_SOLUTION | Freq: Four times a day (QID) | RESPIRATORY_TRACT | Status: DC | PRN

## 2016-09-19 MED ORDER — OXYCODONE HCL 5 MG PO TABS
5.0000 mg | ORAL_TABLET | Freq: Once | ORAL | Status: AC | PRN
  Administered 2016-09-19: 5 mg via ORAL

## 2016-09-19 MED ORDER — POTASSIUM CHLORIDE CRYS ER 20 MEQ PO TBCR
40.0000 meq | EXTENDED_RELEASE_TABLET | Freq: Every day | ORAL | Status: DC
  Administered 2016-09-19 – 2016-09-20 (×2): 40 meq via ORAL
  Filled 2016-09-19 (×2): qty 2

## 2016-09-19 MED ORDER — ONDANSETRON HCL 4 MG/2ML IJ SOLN: 4.0000 mg | Freq: Four times a day (QID) | INTRAMUSCULAR | Status: DC | PRN

## 2016-09-19 MED ORDER — ALBUTEROL SULFATE (2.5 MG/3ML) 0.083% IN NEBU
2.5000 mg | INHALATION_SOLUTION | Freq: Two times a day (BID) | RESPIRATORY_TRACT | Status: DC
  Administered 2016-09-20: 2.5 mg via RESPIRATORY_TRACT
  Filled 2016-09-19 (×2): qty 3

## 2016-09-19 MED ORDER — HYDROCODONE-ACETAMINOPHEN 5-325 MG PO TABS
1.0000 | ORAL_TABLET | ORAL | Status: DC | PRN
  Administered 2016-09-19: 1 via ORAL
  Administered 2016-09-19 – 2016-09-20 (×3): 2 via ORAL
  Filled 2016-09-19: qty 1
  Filled 2016-09-19 (×3): qty 2

## 2016-09-19 MED ORDER — LIDOCAINE HCL (CARDIAC) 20 MG/ML IV SOLN
INTRAVENOUS | Status: DC | PRN
  Administered 2016-09-19: 60 mg via INTRAVENOUS

## 2016-09-19 MED ORDER — LISINOPRIL 5 MG PO TABS
5.0000 mg | ORAL_TABLET | Freq: Every day | ORAL | Status: DC
  Administered 2016-09-19: 5 mg via ORAL
  Filled 2016-09-19 (×2): qty 1

## 2016-09-19 MED ORDER — LEVOTHYROXINE SODIUM 100 MCG PO TABS
100.0000 ug | ORAL_TABLET | Freq: Every day | ORAL | Status: DC
  Administered 2016-09-20: 100 ug via ORAL
  Filled 2016-09-19: qty 1

## 2016-09-19 MED ORDER — ACETAMINOPHEN 650 MG RE SUPP: 650.0000 mg | Freq: Four times a day (QID) | RECTAL | Status: DC | PRN

## 2016-09-19 MED ORDER — FUROSEMIDE 80 MG PO TABS
80.0000 mg | ORAL_TABLET | Freq: Two times a day (BID) | ORAL | Status: DC
  Administered 2016-09-19 – 2016-09-20 (×2): 80 mg via ORAL
  Filled 2016-09-19 (×2): qty 1

## 2016-09-19 MED ORDER — NICOTINE 14 MG/24HR TD PT24
14.0000 mg | MEDICATED_PATCH | Freq: Every day | TRANSDERMAL | Status: DC
  Administered 2016-09-19 – 2016-09-20 (×2): 14 mg via TRANSDERMAL
  Filled 2016-09-19 (×2): qty 1

## 2016-09-19 MED ORDER — HYDROMORPHONE HCL 1 MG/ML IJ SOLN
INTRAMUSCULAR | Status: AC
  Filled 2016-09-19: qty 0.5

## 2016-09-19 MED ORDER — PROMETHAZINE HCL 25 MG/ML IJ SOLN: 6.2500 mg | INTRAMUSCULAR | Status: DC | PRN

## 2016-09-19 MED ORDER — CARVEDILOL 6.25 MG PO TABS
6.2500 mg | ORAL_TABLET | Freq: Two times a day (BID) | ORAL | Status: DC
  Administered 2016-09-19 – 2016-09-20 (×2): 6.25 mg via ORAL
  Filled 2016-09-19 (×2): qty 1

## 2016-09-19 MED ORDER — LACTATED RINGERS IV SOLN
INTRAVENOUS | Status: DC | PRN
  Administered 2016-09-19 (×2): via INTRAVENOUS

## 2016-09-19 MED ORDER — ACETAMINOPHEN 325 MG PO TABS: 650.0000 mg | ORAL_TABLET | Freq: Four times a day (QID) | ORAL | Status: DC | PRN

## 2016-09-19 MED ORDER — ALBUTEROL SULFATE 1.25 MG/3ML IN NEBU: 1.0000 | INHALATION_SOLUTION | Freq: Two times a day (BID) | RESPIRATORY_TRACT | Status: DC

## 2016-09-19 MED ORDER — CLONAZEPAM 1 MG PO TABS
2.0000 mg | ORAL_TABLET | Freq: Two times a day (BID) | ORAL | Status: DC
  Administered 2016-09-19 – 2016-09-20 (×2): 2 mg via ORAL
  Filled 2016-09-19 (×2): qty 2

## 2016-09-19 MED ORDER — MOMETASONE FURO-FORMOTEROL FUM 100-5 MCG/ACT IN AERO
2.0000 | INHALATION_SPRAY | Freq: Two times a day (BID) | RESPIRATORY_TRACT | Status: DC
  Administered 2016-09-19 – 2016-09-20 (×2): 2 via RESPIRATORY_TRACT
  Filled 2016-09-19 (×2): qty 8.8

## 2016-09-19 MED ORDER — HYDROMORPHONE HCL 1 MG/ML IJ SOLN
1.0000 mg | INTRAMUSCULAR | Status: DC | PRN
  Administered 2016-09-19: 1 mg via INTRAVENOUS
  Filled 2016-09-19: qty 1

## 2016-09-19 MED ORDER — SUCCINYLCHOLINE 20MG/ML (10ML) SYRINGE FOR MEDFUSION PUMP - OPTIME
INTRAMUSCULAR | Status: DC | PRN
  Administered 2016-09-19: 120 mg via INTRAVENOUS

## 2016-09-19 MED ORDER — HYDROMORPHONE HCL 1 MG/ML IJ SOLN
INTRAMUSCULAR | Status: AC
  Administered 2016-09-19: 0.5 mg via INTRAVENOUS
  Filled 2016-09-19: qty 0.5

## 2016-09-19 MED ORDER — 0.9 % SODIUM CHLORIDE (POUR BTL) OPTIME
TOPICAL | Status: DC | PRN
  Administered 2016-09-19: 1000 mL

## 2016-09-19 MED ORDER — MIRTAZAPINE 15 MG PO TABS
15.0000 mg | ORAL_TABLET | Freq: Every day | ORAL | Status: DC
  Administered 2016-09-19: 15 mg via ORAL
  Filled 2016-09-19: qty 1

## 2016-09-19 MED ORDER — KCL IN DEXTROSE-NACL 20-5-0.45 MEQ/L-%-% IV SOLN
INTRAVENOUS | Status: DC
  Administered 2016-09-19: 13:00:00 via INTRAVENOUS
  Filled 2016-09-19: qty 1000

## 2016-09-19 MED ORDER — FENTANYL CITRATE (PF) 250 MCG/5ML IJ SOLN
INTRAMUSCULAR | Status: AC
  Filled 2016-09-19: qty 5

## 2016-09-19 MED ORDER — METFORMIN HCL 500 MG PO TABS
500.0000 mg | ORAL_TABLET | Freq: Two times a day (BID) | ORAL | Status: DC
Start: 2016-09-19 — End: 2016-09-20
  Administered 2016-09-19 – 2016-09-20 (×2): 500 mg via ORAL
  Filled 2016-09-19 (×2): qty 1

## 2016-09-19 MED ORDER — BENZONATATE 100 MG PO CAPS
200.0000 mg | ORAL_CAPSULE | Freq: Three times a day (TID) | ORAL | Status: DC | PRN
  Administered 2016-09-19: 200 mg via ORAL
  Filled 2016-09-19: qty 2

## 2016-09-19 MED ORDER — FENTANYL CITRATE (PF) 100 MCG/2ML IJ SOLN
INTRAMUSCULAR | Status: DC | PRN
  Administered 2016-09-19 (×2): 50 ug via INTRAVENOUS
  Administered 2016-09-19: 150 ug via INTRAVENOUS

## 2016-09-19 MED ORDER — ONDANSETRON 4 MG PO TBDP: 4.0000 mg | ORAL_TABLET | Freq: Four times a day (QID) | ORAL | Status: DC | PRN

## 2016-09-19 SURGICAL SUPPLY — 50 items
ATTRACTOMAT 16X20 MAGNETIC DRP (DRAPES) ×2 IMPLANT
BLADE SURG 10 STRL SS (BLADE) ×2 IMPLANT
BLADE SURG 15 STRL LF DISP TIS (BLADE) ×1 IMPLANT
BLADE SURG 15 STRL SS (BLADE) ×2
CANISTER SUCT 3000ML PPV (MISCELLANEOUS) ×2 IMPLANT
CHLORAPREP W/TINT 10.5 ML (MISCELLANEOUS) ×2 IMPLANT
CLIP TI MEDIUM 24 (CLIP) ×2 IMPLANT
CLIP TI WIDE RED SMALL 24 (CLIP) ×2 IMPLANT
COVER SURGICAL LIGHT HANDLE (MISCELLANEOUS) ×2 IMPLANT
CRADLE DONUT ADULT HEAD (MISCELLANEOUS) ×2 IMPLANT
DRAPE LAPAROTOMY 100X72 PEDS (DRAPES) ×2 IMPLANT
DRAPE UTILITY XL STRL (DRAPES) ×2 IMPLANT
ELECT CAUTERY BLADE 6.4 (BLADE) ×2 IMPLANT
ELECT REM PT RETURN 9FT ADLT (ELECTROSURGICAL) ×2
ELECTRODE REM PT RTRN 9FT ADLT (ELECTROSURGICAL) ×1 IMPLANT
GAUZE SPONGE 4X4 12PLY STRL (GAUZE/BANDAGES/DRESSINGS) ×2 IMPLANT
GAUZE SPONGE 4X4 16PLY XRAY LF (GAUZE/BANDAGES/DRESSINGS) ×2 IMPLANT
GLOVE BIO SURGEON STRL SZ7 (GLOVE) ×1 IMPLANT
GLOVE BIO SURGEON STRL SZ7.5 (GLOVE) ×1 IMPLANT
GLOVE BIOGEL PI IND STRL 6 (GLOVE) IMPLANT
GLOVE BIOGEL PI IND STRL 7.0 (GLOVE) IMPLANT
GLOVE BIOGEL PI IND STRL 7.5 (GLOVE) IMPLANT
GLOVE BIOGEL PI INDICATOR 6 (GLOVE) ×1
GLOVE BIOGEL PI INDICATOR 7.0 (GLOVE) ×1
GLOVE BIOGEL PI INDICATOR 7.5 (GLOVE) ×1
GLOVE SURG ORTHO 8.0 STRL STRW (GLOVE) ×2 IMPLANT
GOWN STRL REUS W/ TWL LRG LVL3 (GOWN DISPOSABLE) ×1 IMPLANT
GOWN STRL REUS W/ TWL XL LVL3 (GOWN DISPOSABLE) ×1 IMPLANT
GOWN STRL REUS W/TWL LRG LVL3 (GOWN DISPOSABLE) ×2
GOWN STRL REUS W/TWL XL LVL3 (GOWN DISPOSABLE) ×2
HEMOSTAT SURGICEL 2X4 FIBR (HEMOSTASIS) ×2 IMPLANT
ILLUMINATOR WAVEGUIDE N/F (MISCELLANEOUS) ×1 IMPLANT
KIT BASIN OR (CUSTOM PROCEDURE TRAY) ×2 IMPLANT
KIT ROOM TURNOVER OR (KITS) ×2 IMPLANT
NS IRRIG 1000ML POUR BTL (IV SOLUTION) ×2 IMPLANT
PACK SURGICAL SETUP 50X90 (CUSTOM PROCEDURE TRAY) ×2 IMPLANT
PAD ARMBOARD 7.5X6 YLW CONV (MISCELLANEOUS) ×2 IMPLANT
PENCIL BUTTON HOLSTER BLD 10FT (ELECTRODE) ×2 IMPLANT
SHEARS HARMONIC 9CM CVD (BLADE) ×2 IMPLANT
SPECIMEN JAR MEDIUM (MISCELLANEOUS) ×2 IMPLANT
STRIP CLOSURE SKIN 1/2X4 (GAUZE/BANDAGES/DRESSINGS) ×2 IMPLANT
SUT MNCRL AB 4-0 PS2 18 (SUTURE) ×2 IMPLANT
SUT SILK 2 0 (SUTURE) ×2
SUT SILK 2-0 18XBRD TIE 12 (SUTURE) ×1 IMPLANT
SUT VIC AB 3-0 SH 18 (SUTURE) ×4 IMPLANT
SYR BULB 3OZ (MISCELLANEOUS) ×2 IMPLANT
TAPE CLOTH SURG 4X10 WHT LF (GAUZE/BANDAGES/DRESSINGS) ×1 IMPLANT
TOWEL OR 17X24 6PK STRL BLUE (TOWEL DISPOSABLE) ×2 IMPLANT
TOWEL OR 17X26 10 PK STRL BLUE (TOWEL DISPOSABLE) ×2 IMPLANT
TUBE CONNECTING 12X1/4 (SUCTIONS) ×2 IMPLANT

## 2016-09-19 NOTE — Anesthesia Procedure Notes (Signed)
Procedure Name: Intubation Date/Time: 09/19/2016 7:39 AM Performed by: Neldon Newport Pre-anesthesia Checklist: Timeout performed, Patient being monitored, Suction available, Emergency Drugs available and Patient identified Patient Re-evaluated:Patient Re-evaluated prior to inductionOxygen Delivery Method: Circle system utilized Preoxygenation: Pre-oxygenation with 100% oxygen Intubation Type: IV induction and Rapid sequence Ventilation: Mask ventilation without difficulty and Oral airway inserted - appropriate to patient size Laryngoscope Size: Mac and 3 Grade View: Grade II Tube type: Oral Tube size: 7.0 mm Number of attempts: 1 Placement Confirmation: breath sounds checked- equal and bilateral,  positive ETCO2 and ETT inserted through vocal cords under direct vision Secured at: 22 cm Tube secured with: Tape Dental Injury: Teeth and Oropharynx as per pre-operative assessment

## 2016-09-19 NOTE — Progress Notes (Signed)
Received pt from PACU, A&O x4. Anterior neck dressing intact, with a small dry shadow drainage. Pain meds given as needed. Sat in the chair today, tolerated mod carb diet.

## 2016-09-19 NOTE — Transfer of Care (Signed)
Immediate Anesthesia Transfer of Care Note  Patient: Michele Owens  Procedure(s) Performed: Procedure(s): TOTAL THYROIDECTOMY (N/A)  Patient Location: PACU  Anesthesia Type:General  Level of Consciousness: awake, alert  and oriented  Airway & Oxygen Therapy: Patient Spontanous Breathing and Patient connected to face mask oxygen  Post-op Assessment: Report given to RN, Post -op Vital signs reviewed and stable and Patient moving all extremities X 4  Post vital signs: Reviewed and stable  Last Vitals:  Vitals:   09/19/16 0656  BP: (!) 128/56  Pulse: 85  Resp: 20  Temp: 36.6 C    Last Pain:  Vitals:   09/19/16 0656  TempSrc: Oral  PainSc:       Patients Stated Pain Goal: 5 (48/25/00 3704)  Complications: No apparent anesthesia complications

## 2016-09-19 NOTE — Op Note (Signed)
Procedure Note  Pre-operative Diagnosis:  Thyroid neoplasm of uncertain behavior  Post-operative Diagnosis:  same  Surgeon:  [name], MD, FACS  Assistant:  Sharyn Dross, RNFA   Procedure:  Total thyroidectomy  Anesthesia:  General  Estimated Blood Loss:  minimal  Drains: none         Specimen: thyroid to pathology  Indications:  Patient has a nodule in the upper pole of the right thyroid lobe for which previous fine-needle aspiration biopsy showed it to have atypia. We requested a repeat biopsy in order to obtain molecular genetic testing. However the biopsy performed on June 20, 2016 shows findings suspicious for papillary thyroid carcinoma, Bethesda category V. Therefore molecular genetic testing is not necessary. The recommendation is to proceed with total thyroidectomy for definitive diagnosis and management.   Procedure Details: Procedure was done in OR #2 at the [facility].  The patient was brought to the operating room and placed in a supine position on the operating room table.  Following administration of general anesthesia, the patient was positioned and then prepped and draped in the usual aseptic fashion.  After ascertaining that an adequate level of anesthesia had been achieved, a Kocher incision was made with #15 blade.  Dissection was carried through subcutaneous tissues and platysma. Hemostasis was achieved with the electrocautery.  Skin flaps were elevated cephalad and caudad from the thyroid notch to the sternal notch.  The Mahorner self-retaining retractor was placed for exposure.  Strap muscles were incised in the midline and dissection was begun on the left side.  Strap muscles were reflected laterally.  Left thyroid lobe was mildly enlarged.  The left lobe was gently mobilized with blunt dissection.  Superior pole vessels were dissected out and divided individually between small and medium Ligaclips with the Harmonic scalpel.  The thyroid lobe was  rolled anteriorly.  Branches of the inferior thyroid artery were divided between small Ligaclips with the Harmonic scalpel.  Inferior venous tributaries were divided between Ligaclips.  Both the superior and inferior parathyroid glands were identified and preserved on their vascular pedicles.  The recurrent laryngeal nerve was identified and preserved along its course.  The ligament of Gwenlyn Found was released with the electrocautery and the gland was mobilized onto the anterior trachea. Isthmus was mobilized across the midline.  There was a large pyramidal lobe present which was dissected off of the thyroid cartilage and resected en bloc with the isthmus.  Dry pack was placed in the left neck.  Next, the right thyroid lobe was gently mobilized with blunt dissection.  Right thyroid lobe was markedly enlarged and adherent to the overlying strap muscle which was resected en bloc with the lobe.  Superior pole vessels were dissected out and divided between small and medium Ligaclips with the Harmonic scalpel.  Superior parathyroid was identified and preserved.  Inferior venous tributaries were divided between medium Ligaclips with the Harmonic scalpel.  The right thyroid lobe was rolled anteriorly and the branches of the inferior thyroid artery divided between small Ligaclips.  The right recurrent laryngeal nerve was identified and preserved along its course.  The ligament of Gwenlyn Found was released with the electrocautery.  The right thyroid lobe was mobilized onto the anterior trachea and the remainder of the thyroid was dissected off the anterior trachea and the thyroid was completely excised.  A suture was used to mark the right lobe. The entire thyroid gland was submitted to pathology for review.  The neck was irrigated with warm saline.  Fibrillar was placed throughout the operative field.  Strap muscles were reapproximated in the midline with interrupted 3-0 Vicryl sutures.  Platysma was closed with interrupted 3-0  Vicryl sutures.  Skin was closed with a running 4-0 Monocryl subcuticular suture.  Wound was washed and dried and steri-strips were applied.  Dry gauze dressing was placed.  The patient was awakened from anesthesia and brought to the recovery room.  The patient tolerated the procedure well.   [name], MD, [facility] Office: [phone]

## 2016-09-19 NOTE — Anesthesia Postprocedure Evaluation (Signed)
Anesthesia Post Note  Patient: Michele Owens  Procedure(s) Performed: Procedure(s) (LRB): TOTAL THYROIDECTOMY (N/A)     Patient location during evaluation: PACU Anesthesia Type: General Level of consciousness: awake and alert Pain management: pain level controlled Vital Signs Assessment: post-procedure vital signs reviewed and stable Respiratory status: spontaneous breathing, nonlabored ventilation, respiratory function stable and patient connected to nasal cannula oxygen Cardiovascular status: blood pressure returned to baseline and stable Postop Assessment: no signs of nausea or vomiting Anesthetic complications: no    Last Vitals:  Vitals:   09/19/16 1130 09/19/16 1158  BP:  (!) 152/80  Pulse: 83 84  Resp: 16 18  Temp: 36.7 C 36.5 C    Last Pain:  Vitals:   09/19/16 1158  TempSrc: Oral  PainSc:                  [name]

## 2016-09-19 NOTE — Interval H&P Note (Signed)
History and Physical Interval Note:  09/19/2016 7:09 AM  Michele Owens  has presented today for surgery, with the diagnosis of thyroid neoplasm of uncertain behavior.  The various methods of treatment have been discussed with the patient and family. After consideration of risks, benefits and other options for treatment, the patient has consented to    Procedure(s): TOTAL THYROIDECTOMY (N/A) as a surgical intervention .    The patient's history has been reviewed, patient examined, no change in status, stable for surgery.  I have reviewed the patient's chart and labs.  Questions were answered to the patient's satisfaction.    [name], MD, [facility] Office: [place]

## 2016-09-20 ENCOUNTER — Encounter (HOSPITAL_COMMUNITY): Payer: Self-pay | Admitting: Surgery

## 2016-09-20 DIAGNOSIS — C73 Malignant neoplasm of thyroid gland: Secondary | ICD-10-CM | POA: Diagnosis not present

## 2016-09-20 LAB — BASIC METABOLIC PANEL
Anion gap: 16 — ABNORMAL HIGH (ref 5–15)
BUN: 10 mg/dL (ref 6–20)
CO2: 27 mmol/L (ref 22–32)
CREATININE: 0.64 mg/dL (ref 0.44–1.00)
Calcium: 8.1 mg/dL — ABNORMAL LOW (ref 8.9–10.3)
Chloride: 92 mmol/L — ABNORMAL LOW (ref 101–111)
GFR calc non Af Amer: >60 mL/min (ref >60)
Glucose, Bld: 94 mg/dL (ref 65–99)
Potassium: 4 mmol/L (ref 3.5–5.1)
SODIUM: 135 mmol/L (ref 135–145)

## 2016-09-20 LAB — GLUCOSE, CAPILLARY: GLUCOSE-CAPILLARY: 108 mg/dL — AB (ref 65–99)

## 2016-09-20 MED ORDER — CALCIUM CARBONATE ANTACID 500 MG PO CHEW: 2.0000 | CHEWABLE_TABLET | Freq: Three times a day (TID) | ORAL | 1 refills | Status: DC

## 2016-09-20 MED ORDER — HYDROCODONE-ACETAMINOPHEN 5-325 MG PO TABS: 1.0000 | ORAL_TABLET | ORAL | 0 refills | Status: DC | PRN

## 2016-09-20 MED ORDER — SODIUM CHLORIDE 0.9 % IV SOLN
2.0000 g | INTRAVENOUS | Status: AC
  Administered 2016-09-20: 2 g via INTRAVENOUS
  Filled 2016-09-20: qty 20

## 2016-09-20 MED ORDER — SYNTHROID 100 MCG PO TABS: 100.0000 ug | ORAL_TABLET | Freq: Every day | ORAL | 3 refills | Status: DC

## 2016-09-20 NOTE — Progress Notes (Signed)
Pt is ready to go home. Incision with steri strips dry and intact, light dressing applied. Discharge instructions given, verbalized understanding. Discharged to home, picked up by county transportation.

## 2016-09-20 NOTE — Discharge Summary (Signed)
Physician Discharge Summary [facility]  Patient ID: Michele Owens MRN: [id number] DOB/AGE: 09-15-1960 56 y.o.  Admit date: 09/19/2016 Discharge date: 09/20/2016  Admission Diagnoses:  Thyroid neoplasm of uncertain behavior  Discharge Diagnoses:  Principal Problem:   Neoplasm of uncertain behavior of thyroid gland   Discharged Condition: good  Hospital Course: Patient was admitted for observation following thyroid surgery.  Post op course was uncomplicated.  Pain was well controlled.  Tolerated diet.  Post op calcium level on morning following surgery was 8.1 mg/dl.  Patient was given calcium gluconate 2 gm IV prior to discharge.  Patient was prepared for discharge home on POD#1.  Consults: None  Treatments: surgery: total thyroidectomy  Discharge Exam: Blood pressure 116/70, pulse 92, temperature 97.2 F (36.2 C), temperature source Oral, resp. rate 18, height 5\' 4"  (1.626 m), weight 132.5 kg (292 lb), SpO2 100 %. HEENT - clear Neck - wound dry and intact; voice normal; mild STS Chest - clear bilaterally Cor - RRR  Disposition: Home  Discharge Instructions    Apply dressing    Complete by:  As directed    Apply light gauze dressing to wound before discharge home today.   Diet - low sodium heart healthy    Complete by:  As directed    Discharge instructions    Complete by:  As directed    [facility]  THYROID & PARATHYROID SURGERY:  POST-OP INSTRUCTIONS  Always review your discharge instruction sheet from the facility where your surgery was performed.  A prescription for pain medication may be given to you upon discharge.  Take your pain medication as prescribed.  If narcotic pain medicine is not needed, then you may take acetaminophen (Tylenol) or ibuprofen (Advil) as needed.  Take your usually prescribed medications unless otherwise directed.  If you need a refill on your pain medication, please contact our office during  regular business hours.  Prescriptions will not be processed by our office after 5 pm or on weekends.  Start with a light diet upon arrival home, such as soup and crackers or toast.  Be sure to drink plenty of fluids daily.  Resume your normal diet the day after surgery.  Most patients will experience some swelling and bruising on the chest and neck area.  Ice packs will help.  Swelling and bruising can take several days to resolve.   It is common to experience some constipation after surgery.  Increasing fluid intake and taking a stool softener (Colace) will usually help or prevent this problem.  A mild laxative (Milk of Magnesia or Miralax) should be taken according to package directions if there has been no bowel movement after 48 hours.  You have steri-strips and a gauze dressing over your incision.  You may remove the gauze bandage on the second day after surgery, and you may shower at that time.  Leave your steri-strips (small skin tapes) in place directly over the incision.  These strips should remain on the skin for 5-7 days and then be removed.  You may get them wet in the shower and pat them dry.  You may resume regular (light) daily activities beginning the next day - such as daily self-care, walking, climbing stairs - gradually increasing activities as tolerated.  You may have sexual intercourse when it is comfortable.  Refrain from any heavy lifting or straining until approved by your doctor.  You may drive when you no longer are taking prescription pain medication, you can  comfortably wear a seatbelt, and you can safely maneuver your car and apply brakes.  You should see your doctor in the office for a follow-up appointment approximately three weeks after your surgery.  Make sure that you call for this appointment within a day or two after you arrive home to insure a convenient appointment time.  WHEN TO CALL YOUR DOCTOR: -- Fever greater than 101.5 -- Inability to urinate -- Nausea  and/or vomiting - persistent -- Extreme swelling or bruising -- Continued bleeding from incision -- Increased pain, redness, or drainage from the incision -- Difficulty swallowing or breathing -- Muscle cramping or spasms -- Numbness or tingling in hands or around lips  The clinic staff is available to answer your questions during regular business hours.  Please don't hesitate to call and ask to speak to one of the nurses if you have concerns.  [name], MD, [facility] Office: [phone]  Website: [email or web address]   Increase activity slowly    Complete by:  As directed    Remove dressing in 24 hours    Complete by:  As directed      Allergies as of 09/20/2016      Reactions   Bee Venom Swelling, Other (See Comments)   "All over my body" (swelling)   Ibuprofen Rash, Other (See Comments)   Severe rash   Lamisil [terbinafine Hcl] Rash, Other (See Comments)   Pt states this causes her to "feel funny"   Nsaids Other (See Comments)   Per MD's orders       Medication List    TAKE these medications   acetaminophen 325 MG tablet Commonly known as:  TYLENOL Take 2 tablets (650 mg total) by mouth every 4 (four) hours as needed for headache or mild pain.   albuterol 1.25 MG/3ML nebulizer solution Commonly known as:  ACCUNEB Take 1 ampule by nebulization 2 (two) times daily.   albuterol 108 (90 Base) MCG/ACT inhaler Commonly known as:  PROVENTIL HFA;VENTOLIN HFA Inhale 1-2 puffs into the lungs every 6 (six) hours as needed for wheezing.   allopurinol 100 MG tablet Commonly known as:  ZYLOPRIM Take 100 mg by mouth daily.   benzonatate 200 MG capsule Commonly known as:  TESSALON Take 1 capsule (200 mg total) by mouth 3 (three) times daily as needed for cough.   budesonide-formoterol 80-4.5 MCG/ACT inhaler Commonly known as:  SYMBICORT Inhale 2 puffs into the lungs 2 (two) times daily.   calcium  carbonate 500 MG chewable tablet Commonly known as:  TUMS Chew 2 tablets (400 mg of elemental calcium total) by mouth 3 (three) times daily.   carvedilol 6.25 MG tablet Commonly known as:  COREG TAKE ONE TABLET BY MOUTH TWICE DAILY WITH A MEAL   clonazePAM 2 MG tablet Commonly known as:  KLONOPIN Take 1 tablet (2 mg total) by mouth 2 (two) times daily.   colchicine 0.6 MG tablet Take 0.6 mg by mouth 2 (two) times daily.   diclofenac sodium 1 % Gel Commonly known as:  VOLTAREN Apply 2 g topically 4 (four) times daily as needed (for pain).   dicyclomine 20 MG tablet Commonly known as:  BENTYL Take 1 tablet (20 mg total) by mouth 2 (two) times daily as needed (abdominal pain/cramping).   EPINEPHrine 0.3 mg/0.3 mL Soaj injection Commonly known as:  EPI-PEN Inject 0.3 mg into the muscle daily as needed (allergic reaction).   furosemide 40 MG tablet Commonly known as:  LASIX Take 2 tablets (80 mg total) by mouth 2 (two) times daily.   HYDROcodone-acetaminophen 5-325 MG tablet Commonly known as:  NORCO/VICODIN take 1 tablet by mouth every 12 hours WHEN NECESSARY PAIN (MUST LAST 2 WEEKS); Pt received 28 pills total What changed:  Another medication with the same name was added. Make sure you understand how and when to take each.   HYDROcodone-acetaminophen 5-325 MG tablet Commonly known as:  NORCO/VICODIN Take 1-2 tablets by mouth every 4 (four) hours as needed for moderate pain. What changed:  You were already taking a medication with the same name, and this prescription was added. Make sure you understand how and when to take each.   lisinopril 5 MG tablet Commonly known as:  PRINIVIL,ZESTRIL Take 1 tablet (5 mg total) by mouth daily.   loratadine 10 MG tablet Commonly known as:  CLARITIN Take 10 mg by mouth daily as needed for allergies.   metFORMIN 500 MG tablet Commonly known as:  GLUCOPHAGE Take 1 tablet (500 mg total) by mouth 2 (two) times daily with a meal.    mirtazapine 15 MG tablet Commonly known as:  REMERON Take 15 mg by mouth at bedtime.   nicotine 14 mg/24hr patch Commonly known as:  NICODERM CQ - dosed in mg/24 hours Place 1 patch (14 mg total) onto the skin daily.   omeprazole 40 MG capsule Commonly known as:  PRILOSEC Take 40 mg by mouth daily.   potassium chloride SA 20 MEQ tablet Commonly known as:  K-DUR,KLOR-CON Take 2 tablets (40 mEq total) by mouth daily.   pravastatin 40 MG tablet Commonly known as:  PRAVACHOL Take 40 mg by mouth daily.   SYNTHROID 100 MCG tablet Generic drug:  levothyroxine Take 1 tablet (100 mcg total) by mouth daily.   tiotropium 18 MCG inhalation capsule Commonly known as:  SPIRIVA Place 18 mcg into inhaler and inhale daily.   TYLENOL/CODEINE #3 300-30 MG tablet Generic drug:  Acetaminophen-Codeine Take 1 tablet by mouth every 6 (six) hours as needed for pain.      Follow-up Information    [name], MD. Schedule an appointment as soon as possible for a visit in 3 week(s).   Specialty:  General Surgery Contact information: [address] [place] [address] [phone]           [name], MD, [facility] Office: [phone]   Signed: [name] 09/20/2016, 8:06 AM

## 2016-09-21 ENCOUNTER — Ambulatory Visit: Payer: Medicaid Other | Admitting: Podiatry

## 2016-09-22 ENCOUNTER — Inpatient Hospital Stay (HOSPITAL_COMMUNITY)
Admission: EM | Admit: 2016-09-22 | Discharge: 2016-09-25 | DRG: 641 | Disposition: A | Discharge status: Home / Self Care | Payer: Medicaid Other | Attending: Internal Medicine | Admitting: Internal Medicine

## 2016-09-22 DIAGNOSIS — Z87891 Personal history of nicotine dependence: Secondary | ICD-10-CM

## 2016-09-22 DIAGNOSIS — F419 Anxiety disorder, unspecified: Secondary | ICD-10-CM | POA: Diagnosis present

## 2016-09-22 DIAGNOSIS — I110 Hypertensive heart disease with heart failure: Secondary | ICD-10-CM | POA: Diagnosis present

## 2016-09-22 DIAGNOSIS — R0789 Other chest pain: Secondary | ICD-10-CM | POA: Diagnosis present

## 2016-09-22 DIAGNOSIS — I5033 Acute on chronic diastolic (congestive) heart failure: Secondary | ICD-10-CM

## 2016-09-22 DIAGNOSIS — M109 Gout, unspecified: Secondary | ICD-10-CM | POA: Diagnosis present

## 2016-09-22 DIAGNOSIS — Z86718 Personal history of other venous thrombosis and embolism: Secondary | ICD-10-CM

## 2016-09-22 DIAGNOSIS — D72829 Elevated white blood cell count, unspecified: Secondary | ICD-10-CM | POA: Diagnosis present

## 2016-09-22 DIAGNOSIS — I10 Essential (primary) hypertension: Secondary | ICD-10-CM | POA: Diagnosis present

## 2016-09-22 DIAGNOSIS — Z90710 Acquired absence of both cervix and uterus: Secondary | ICD-10-CM

## 2016-09-22 DIAGNOSIS — J449 Chronic obstructive pulmonary disease, unspecified: Secondary | ICD-10-CM | POA: Diagnosis present

## 2016-09-22 DIAGNOSIS — Z91030 Bee allergy status: Secondary | ICD-10-CM

## 2016-09-22 DIAGNOSIS — D649 Anemia, unspecified: Secondary | ICD-10-CM | POA: Diagnosis present

## 2016-09-22 DIAGNOSIS — I5042 Chronic combined systolic (congestive) and diastolic (congestive) heart failure: Secondary | ICD-10-CM | POA: Diagnosis present

## 2016-09-22 DIAGNOSIS — E876 Hypokalemia: Secondary | ICD-10-CM | POA: Diagnosis present

## 2016-09-22 DIAGNOSIS — H538 Other visual disturbances: Secondary | ICD-10-CM | POA: Diagnosis present

## 2016-09-22 DIAGNOSIS — C73 Malignant neoplasm of thyroid gland: Secondary | ICD-10-CM | POA: Diagnosis present

## 2016-09-22 DIAGNOSIS — E119 Type 2 diabetes mellitus without complications: Secondary | ICD-10-CM

## 2016-09-22 DIAGNOSIS — O2230 Deep phlebothrombosis in pregnancy, unspecified trimester: Secondary | ICD-10-CM | POA: Diagnosis present

## 2016-09-22 DIAGNOSIS — Z79899 Other long term (current) drug therapy: Secondary | ICD-10-CM

## 2016-09-22 DIAGNOSIS — E890 Postprocedural hypothyroidism: Secondary | ICD-10-CM | POA: Diagnosis present

## 2016-09-22 DIAGNOSIS — K219 Gastro-esophageal reflux disease without esophagitis: Secondary | ICD-10-CM | POA: Diagnosis present

## 2016-09-22 DIAGNOSIS — Z888 Allergy status to other drugs, medicaments and biological substances status: Secondary | ICD-10-CM

## 2016-09-22 DIAGNOSIS — Z9981 Dependence on supplemental oxygen: Secondary | ICD-10-CM

## 2016-09-22 DIAGNOSIS — Z886 Allergy status to analgesic agent status: Secondary | ICD-10-CM

## 2016-09-22 DIAGNOSIS — Z85850 Personal history of malignant neoplasm of thyroid: Secondary | ICD-10-CM

## 2016-09-22 DIAGNOSIS — E785 Hyperlipidemia, unspecified: Secondary | ICD-10-CM | POA: Diagnosis present

## 2016-09-22 DIAGNOSIS — E1169 Type 2 diabetes mellitus with other specified complication: Secondary | ICD-10-CM

## 2016-09-22 DIAGNOSIS — Z7951 Long term (current) use of inhaled steroids: Secondary | ICD-10-CM

## 2016-09-22 DIAGNOSIS — J9611 Chronic respiratory failure with hypoxia: Secondary | ICD-10-CM | POA: Diagnosis present

## 2016-09-22 DIAGNOSIS — G4733 Obstructive sleep apnea (adult) (pediatric): Secondary | ICD-10-CM | POA: Diagnosis present

## 2016-09-22 DIAGNOSIS — Z6841 Body Mass Index (BMI) 40.0 and over, adult: Secondary | ICD-10-CM

## 2016-09-22 DIAGNOSIS — F329 Major depressive disorder, single episode, unspecified: Secondary | ICD-10-CM | POA: Diagnosis present

## 2016-09-22 DIAGNOSIS — J9811 Atelectasis: Secondary | ICD-10-CM | POA: Diagnosis present

## 2016-09-22 DIAGNOSIS — N179 Acute kidney failure, unspecified: Secondary | ICD-10-CM | POA: Diagnosis present

## 2016-09-22 DIAGNOSIS — Z833 Family history of diabetes mellitus: Secondary | ICD-10-CM

## 2016-09-22 DIAGNOSIS — Z7984 Long term (current) use of oral hypoglycemic drugs: Secondary | ICD-10-CM

## 2016-09-22 DIAGNOSIS — R079 Chest pain, unspecified: Secondary | ICD-10-CM | POA: Diagnosis present

## 2016-09-22 HISTORY — DX: Malignant neoplasm of thyroid gland: C73

## 2016-09-22 LAB — CBG MONITORING, ED: GLUCOSE-CAPILLARY: 119 mg/dL — AB (ref 65–99)

## 2016-09-22 MED ORDER — HYDROCODONE-ACETAMINOPHEN 5-325 MG PO TABS
2.0000 | ORAL_TABLET | Freq: Once | ORAL | Status: AC
  Administered 2016-09-23: 2 via ORAL
  Filled 2016-09-22: qty 2

## 2016-09-22 NOTE — ED Notes (Signed)
Pt wants blood draw from IV,  Nurse starting IV and will get labs

## 2016-09-22 NOTE — ED Triage Notes (Signed)
Ems pt from home, thyroid surgery on Tuesday. Pt reports cramping and weakness since procedure. Pt did not notify surgeon. Upon moving to ED stretcher pt reports left sided chest pain .

## 2016-09-22 NOTE — ED Provider Notes (Signed)
[facility] DEPT Provider Note   CSN: [id number] Arrival date & time: 09/22/16  2317 By signing my name below, I, Dyke Brackett, attest that this documentation has been prepared under the direction and in the presence of Donyelle Enyeart, Annie Main, MD . Electronically Signed: Dyke Brackett, Scribe. 09/22/2016. 11:49 PM.   History   Chief Complaint Chief Complaint  Patient presents with  . Fatigue    HPI Michele Owens is a 56 y.o. female with a history of COPD, DM, HTN, DVT and gout who presents to the Emergency Department complaining of constant, cramping pain to bilateral legs onset yesterday. She reports associated generalized fatigue, photophobia, and blurred vision to bilateral eyes which is brought on by bending down. Pt had thyroidectomy three days ago and was started on Synthroid and Tums following her surgery. She is not currently receiving chemotherapy or radiation, and has not been dx with thyroid cancer. Pt is on continuous O2 at home. She denies any difficulty breathing, difficulty swallowing, or black spots in vision.   Pt also reports left-sided chest pain that does not radiate which began tonight while in the ED. Pain is exacerbated by direct palpation. No hx MI.    The history is provided by the patient. No language interpreter was used.    Past Medical History:  Diagnosis Date  . Anxiety   . Arthritis   . Asthma   . Chronic diastolic CHF (congestive heart failure) ([place])   . COPD (chronic obstructive pulmonary disease) (HCC)    Uses Oxygen at night  . Depression   . Diabetes mellitus without complication ([place])   . Dyspnea    occ  . GERD (gastroesophageal reflux disease)   . Gout   . Headache    migraines  . History of DVT of lower extremity   . History of nuclear stress test    Myoview 2/17:  Low risk stress nuclear study with a small, moderate intensity, partially reversible inferior lateral defect consistent with small prior infarct and minimal peri-infarct  ischemia; EF 68 with normal wall motion  . Hypertension   . Pneumonia     Patient Active Problem List   Diagnosis Date Noted  . 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 Acute pulmonary edema (HCC)   . Acute hypoxemic respiratory failure ([place])   . Acute on chronic respiratory failure with hypoxemia ([place])   . Ventilator dependent ([place])   . Respiratory failure ([place]) 07/16/2016  . Chronic diastolic heart failure ([place]) 02/10/2016  . Dyspnea   . CAP (community acquired pneumonia) 01/23/2016  . 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 Pulmonary hypertension ([place]) 01/23/2016  . Sepsis ([place]) 01/23/2016  . Obesity hypoventilation syndrome ([place]) 08/26/2015  . Chest pain 06/16/2015  . Dyslipidemia associated with type 2 diabetes mellitus ([place]) 04/24/2015  . Leukocytosis 04/24/2015  . Controlled type 2 diabetes mellitus without complication, without long-term current use of insulin ([place]) 04/24/2015  . Anxiety 04/24/2015  . Benign essential HTN 04/24/2015  . Normocytic anemia 04/24/2015  . Asthma with COPD with exacerbation ([place]) 04/24/2015  . OSA (obstructive sleep apnea) 05/10/2012  . Tobacco abuse 07/04/2011    Past Surgical History:  Procedure Laterality Date  . BUNIONECTOMY Bilateral   . CARDIAC CATHETERIZATION N/A 06/23/2015   Procedure: Right/Left Heart Cath and Coronary Angiography;  Surgeon: [name], MD;  Location: [facility];  Service: Cardiovascular;  Laterality: N/A;  . COLONOSCOPY WITH PROPOFOL N/A 12/15/2015   Procedure: COLONOSCOPY WITH  PROPOFOL;  Surgeon: [name], MD;  Location[facility] ENDOSCOPY;  Service: Endoscopy;  Laterality: N/A;  . THYROIDECTOMY  09/19/2016  . THYROIDECTOMY N/A 09/19/2016   Procedure: TOTAL THYROIDECTOMY;  Surgeon: [name], MD;  Location: [facility];  Service: General;  Laterality: N/A;  . TONSILLECTOMY    . TOTAL ABDOMINAL HYSTERECTOMY  07/14/10    OB History    No data available        Home Medications    Prior to Admission medications   Medication Sig Start Date End Date Taking? Authorizing Provider  acetaminophen (TYLENOL) 325 MG tablet Take 2 tablets (650 mg total) by mouth every 4 (four) hours as needed for headache or mild pain. 06/24/15   Shirley Friar, PA-C  Acetaminophen-Codeine (TYLENOL/CODEINE #3) 300-30 MG tablet Take 1 tablet by mouth every 6 (six) hours as needed for pain.    [provider]  albuterol (ACCUNEB) 1.25 MG/3ML nebulizer solution Take 1 ampule by nebulization 2 (two) times daily.    [provider]  albuterol (PROVENTIL HFA;VENTOLIN HFA) 108 (90 BASE) MCG/ACT inhaler Inhale 1-2 puffs into the lungs every 6 (six) hours as needed for wheezing. 04/09/12   Billy Fischer, MD  allopurinol (ZYLOPRIM) 100 MG tablet Take 100 mg by mouth daily.    [provider]  benzonatate (TESSALON) 200 MG capsule Take 1 capsule (200 mg total) by mouth 3 (three) times daily as needed for cough. 07/24/16   Lavina Hamman, MD  budesonide-formoterol [facility]) 80-4.5 MCG/ACT inhaler Inhale 2 puffs into the lungs 2 (two) times daily. 07/24/16   Lavina Hamman, MD  calcium carbonate (TUMS) 500 MG chewable tablet Chew 2 tablets (400 mg of elemental calcium total) by mouth 3 (three) times daily. 09/20/16   [name], MD  carvedilol (COREG) 6.25 MG tablet TAKE ONE TABLET BY MOUTH TWICE DAILY WITH A MEAL 08/22/16   Bensimhon, [name], MD  clonazePAM (KLONOPIN) 2 MG tablet Take 1 tablet (2 mg total) by mouth 2 (two) times daily. 01/27/16   Mikhail, Velta Addison, DO  colchicine 0.6 MG tablet Take 0.6 mg by mouth 2 (two) times daily.    [provider]  diclofenac sodium (VOLTAREN) 1 % GEL Apply 2 g topically 4 (four) times daily as needed (for pain).     [provider]  dicyclomine (BENTYL) 20 MG tablet Take 1 tablet (20 mg total) by mouth 2 (two) times daily as needed (abdominal pain/cramping). 11/28/15   Antonietta Breach, PA-C   EPINEPHrine 0.3 mg/0.3 mL IJ SOAJ injection Inject 0.3 mg into the muscle daily as needed (allergic reaction).    [provider]  furosemide (LASIX) 40 MG tablet Take 2 tablets (80 mg total) by mouth 2 (two) times daily. 08/01/16   [name], MD  HYDROcodone-acetaminophen (NORCO/VICODIN) 5-325 MG tablet take 1 tablet by mouth every 12 hours WHEN NECESSARY PAIN (MUST LAST 2 WEEKS); Pt received 28 pills total 09/21/15   [provider]  HYDROcodone-acetaminophen (NORCO/VICODIN) 5-325 MG tablet Take 1-2 tablets by mouth every 4 (four) hours as needed for moderate pain. 09/20/16   [name], MD  lisinopril (PRINIVIL,ZESTRIL) 5 MG tablet Take 1 tablet (5 mg total) by mouth daily. 08/01/16   [name], MD  loratadine (CLARITIN) 10 MG tablet Take 10 mg by mouth daily as needed for allergies.     [provider]  metFORMIN (GLUCOPHAGE) 500 MG tablet Take 1 tablet (500 mg total) by mouth 2 (two) times daily with  a meal. 12/11/14   [name], MD  mirtazapine (REMERON) 15 MG tablet Take 15 mg by mouth at bedtime. 11/16/14   [provider]  nicotine (NICODERM CQ - DOSED IN MG/24 HOURS) 14 mg/24hr patch Place 1 patch (14 mg total) onto the skin daily. 07/25/16   Lavina Hamman, MD  omeprazole (PRILOSEC) 40 MG capsule Take 40 mg by mouth daily. 04/13/15   [provider]  potassium chloride SA (K-DUR,KLOR-CON) 20 MEQ tablet Take 2 tablets (40 mEq total) by mouth daily. 08/01/16   [name], MD  pravastatin (PRAVACHOL) 40 MG tablet Take 40 mg by mouth daily.  03/29/15   [provider]  SYNTHROID 100 MCG tablet Take 1 tablet (100 mcg total) by mouth daily. 09/20/16   [name], MD  tiotropium (SPIRIVA) 18 MCG inhalation capsule Place 18 mcg into inhaler and inhale daily.    [provider]    Family History Family History  Problem Relation Age of Onset  . Cancer Father   . Diabetes Mother     Social History Social  History  Substance Use Topics  . Smoking status: Former Smoker    Packs/day: 0.50    Years: 41.00    Types: Cigarettes    Quit date: 07/2016  . Smokeless tobacco: Never Used     Comment: Starting to [employer] -- using Nicotine Patch  . Alcohol use 0.0 oz/week     Comment: ? none now     Allergies   Bee venom; Ibuprofen; Lamisil [terbinafine hcl]; and Nsaids   Review of Systems Review of Systems All systems reviewed and are negative for acute change except as noted in the HPI.  Physical Exam Updated Vital Signs BP (!) 108/56   Pulse 89   Temp 99.5 F (37.5 C) (Oral)   Resp (!) 22   Ht 5\' 4"  (1.626 m)   Wt 292 lb (132.5 kg)   SpO2 96%   BMI 50.12 kg/m   Physical Exam  Constitutional: She is oriented to person, place, and time. She appears well-developed and well-nourished. No distress.  HENT:  Head: Normocephalic and atraumatic.  Mouth/Throat: Oropharynx is clear and moist. No oropharyngeal exudate.  Eyes: Conjunctivae and EOM are normal. Pupils are equal, round, and reactive to light.  Neck: Normal range of motion. Neck supple.  No meningismus. Surgical incision intact to her neck.  Cardiovascular: Normal rate, regular rhythm, normal heart sounds and intact distal pulses.   No murmur heard. Pulmonary/Chest: Effort normal and breath sounds normal. No respiratory distress. She exhibits tenderness.  Reproducible chest wall tenderness.    Abdominal: Soft. There is no tenderness. There is no rebound and no guarding.  Musculoskeletal: Normal range of motion. She exhibits tenderness. She exhibits no edema.  Bilateral calf tenderness. No asymmetry of skin change.   Neurological: She is alert and oriented to person, place, and time. No cranial nerve deficit. She exhibits normal muscle tone. Coordination normal.   5/5 strength throughout. CN 2-12 intact.Equal grip strength.   Skin: Skin is warm.  Psychiatric: She has a normal mood and affect. Her behavior is normal.  Nursing  note and vitals reviewed.   ED Treatments / Results  DIAGNOSTIC STUDIES:  Oxygen Saturation is 96% on [place], adequate by my interpretation.    COORDINATION OF CARE:  11:47 PM Discussed treatment plan with pt at bedside and pt agreed to plan.   Labs (all labs ordered are listed, but only abnormal results are displayed)  Labs Reviewed  CBC WITH DIFFERENTIAL/PLATELET - Abnormal; Notable for the following:       Result Value   WBC 19.5 (*)    RBC 3.75 (*)    Hemoglobin 9.6 (*)    HCT 31.9 (*)    MCH 25.6 (*)    RDW 16.3 (*)    Neutro Abs 15.4 (*)    All other components within normal limits  COMPREHENSIVE METABOLIC PANEL - Abnormal; Notable for the following:    Chloride 97 (*)    Glucose, Bld 109 (*)    BUN 26 (*)    Creatinine, Ser 1.36 (*)    Calcium 6.3 (*)    Albumin 3.4 (*)    ALT 13 (*)    GFR calc non Af Amer 43 (*)    GFR calc Af Amer 49 (*)    All other components within normal limits  TROPONIN I - Abnormal; Notable for the following:    Troponin I 0.03 (*)    All other components within normal limits  TSH - Abnormal; Notable for the following:    TSH 6.713 (*)    All other components within normal limits  MAGNESIUM - Abnormal; Notable for the following:    Magnesium 1.4 (*)    All other components within normal limits  D-DIMER, QUANTITATIVE (NOT AT [facility]) - Abnormal; Notable for the following:    D-Dimer, Quant 1.21 (*)    All other components within normal limits  MAGNESIUM - Abnormal; Notable for the following:    Magnesium 1.4 (*)    All other components within normal limits  LIPID PANEL - Abnormal; Notable for the following:    Triglycerides 176 (*)    HDL 39 (*)    All other components within normal limits  CBG MONITORING, ED - Abnormal; Notable for the following:    Glucose-Capillary 119 (*)    All other components within normal limits  T4, FREE  URINALYSIS, ROUTINE W REFLEX MICROSCOPIC  BRAIN NATRIURETIC PEPTIDE  TROPONIN I  GLUCOSE, CAPILLARY    PARATHYROID HORMONE, INTACT (NO CA)  CREATININE, URINE, RANDOM  UREA NITROGEN, URINE  HEMOGLOBIN A1C  TROPONIN I  TROPONIN I  HEPARIN LEVEL (UNFRACTIONATED)  BASIC METABOLIC PANEL    EKG  EKG Interpretation  Date/Time:  Friday September 22 2016 23:28:40 EDT Ventricular Rate:  87 PR Interval:    QRS Duration: 85 QT Interval:  401 QTC Calculation: 483 R Axis:   36 Text Interpretation:  Sinus rhythm Low voltage, precordial leads Baseline wander in lead(s) V3 Nonspecific T wave abnormality Confirmed by Ezequiel Essex [phone]) on 09/23/2016 12:10:10 AM       Radiology Dg Chest 2 View  Result Date: 09/23/2016 CLINICAL DATA:  Chest pain EXAM: CHEST  2 VIEW COMPARISON:  08/23/2016, 07/21/2016, 07/20/2016 FINDINGS: Cardiomegaly. Development of bibasilar atelectasis or pneumonia. Possible small effusions. Interim finding of a right convex hilar enlargement. No pneumothorax. Surgical clips at the thoracic inlet. IMPRESSION: 1. Cardiomegaly with development of bibasilar atelectasis or pneumonia 2. Interval finding of convex right hilar enlargement ; uncertain if this is due to enlarged hilar vessels, vascular abnormality, or mass. CT is recommended for further evaluation. 3. Interval postsurgical changes at the thoracic inlet. Electronically Signed   By: Donavan Foil M.D.   On: 09/23/2016 00:53    Procedures Procedures (including critical care time)  Medications Ordered in ED Medications - No data to display   Initial Impression / Assessment and Plan / ED Course  I have  reviewed the triage vital signs and the nursing notes.  Pertinent labs & imaging results that were available during my care of the patient were reviewed by me and considered in my medical decision making (see chart for details).     Leg cramps for the past 2 days with generalized weakness. Also has blurry vision when she looks down only. Left-sided chest pain that is reproducible to palpation.  EKG has nonspecific  T-wave inversions.  Chest x-ray shows new cardiomegaly with bibasilar atelectasis and right hilar enlargement. CT scan will need to be obtained.  Patient found to have calcium of 6.2. We'll give IV calcium. This likely responsible for her cramps. Abnormal chest x-ray reviewed with Dr. Hulen Skains and this feels similar to postop chest x-rays. We'll obtain CT to further evaluate. IV Lasix given.  Patient given IV Lasix and IV calcium for hypocalcemia. Her chest pain is reproducible and her troponin is minimally elevated. Suspect her troponin elevation is due to CHF rather than ACS.  Admission discussed with Dr. Blaine Hamper. CT chest pending at time of admission. Final Clinical Impressions(s) / ED Diagnoses   Final diagnoses:  Hypocalcemia  Acute on chronic diastolic congestive heart failure (HCC)  Chest pain    New Prescriptions New Prescriptions   No medications on file   I personally performed the services described in this documentation, which was scribed in my presence. The recorded information has been reviewed and is accurate.    Ezequiel Essex, MD 09/23/16 [phone]

## 2016-09-23 ENCOUNTER — Encounter (HOSPITAL_COMMUNITY): Payer: Self-pay | Admitting: Internal Medicine

## 2016-09-23 ENCOUNTER — Inpatient Hospital Stay (HOSPITAL_COMMUNITY): Payer: Medicaid Other

## 2016-09-23 ENCOUNTER — Emergency Department (HOSPITAL_COMMUNITY): Payer: Medicaid Other

## 2016-09-23 DIAGNOSIS — J449 Chronic obstructive pulmonary disease, unspecified: Secondary | ICD-10-CM | POA: Diagnosis not present

## 2016-09-23 DIAGNOSIS — R079 Chest pain, unspecified: Secondary | ICD-10-CM | POA: Diagnosis not present

## 2016-09-23 DIAGNOSIS — M79609 Pain in unspecified limb: Secondary | ICD-10-CM

## 2016-09-23 DIAGNOSIS — D649 Anemia, unspecified: Secondary | ICD-10-CM | POA: Diagnosis present

## 2016-09-23 DIAGNOSIS — I360 Nonrheumatic tricuspid (valve) stenosis: Secondary | ICD-10-CM | POA: Diagnosis not present

## 2016-09-23 DIAGNOSIS — Z86718 Personal history of other venous thrombosis and embolism: Secondary | ICD-10-CM | POA: Diagnosis not present

## 2016-09-23 DIAGNOSIS — E785 Hyperlipidemia, unspecified: Secondary | ICD-10-CM | POA: Diagnosis present

## 2016-09-23 DIAGNOSIS — D72829 Elevated white blood cell count, unspecified: Secondary | ICD-10-CM | POA: Diagnosis present

## 2016-09-23 DIAGNOSIS — C73 Malignant neoplasm of thyroid gland: Secondary | ICD-10-CM | POA: Diagnosis not present

## 2016-09-23 DIAGNOSIS — E890 Postprocedural hypothyroidism: Secondary | ICD-10-CM | POA: Diagnosis present

## 2016-09-23 DIAGNOSIS — J9611 Chronic respiratory failure with hypoxia: Secondary | ICD-10-CM | POA: Diagnosis not present

## 2016-09-23 DIAGNOSIS — M109 Gout, unspecified: Secondary | ICD-10-CM | POA: Diagnosis present

## 2016-09-23 DIAGNOSIS — I82409 Acute embolism and thrombosis of unspecified deep veins of unspecified lower extremity: Secondary | ICD-10-CM

## 2016-09-23 DIAGNOSIS — R0789 Other chest pain: Secondary | ICD-10-CM | POA: Diagnosis present

## 2016-09-23 DIAGNOSIS — E119 Type 2 diabetes mellitus without complications: Secondary | ICD-10-CM | POA: Diagnosis present

## 2016-09-23 DIAGNOSIS — I10 Essential (primary) hypertension: Secondary | ICD-10-CM

## 2016-09-23 DIAGNOSIS — F419 Anxiety disorder, unspecified: Secondary | ICD-10-CM | POA: Diagnosis not present

## 2016-09-23 DIAGNOSIS — N179 Acute kidney failure, unspecified: Secondary | ICD-10-CM | POA: Diagnosis not present

## 2016-09-23 DIAGNOSIS — J9811 Atelectasis: Secondary | ICD-10-CM | POA: Diagnosis not present

## 2016-09-23 DIAGNOSIS — O2230 Deep phlebothrombosis in pregnancy, unspecified trimester: Secondary | ICD-10-CM | POA: Diagnosis present

## 2016-09-23 DIAGNOSIS — I5042 Chronic combined systolic (congestive) and diastolic (congestive) heart failure: Secondary | ICD-10-CM | POA: Diagnosis not present

## 2016-09-23 DIAGNOSIS — H538 Other visual disturbances: Secondary | ICD-10-CM | POA: Diagnosis present

## 2016-09-23 DIAGNOSIS — K219 Gastro-esophageal reflux disease without esophagitis: Secondary | ICD-10-CM | POA: Diagnosis not present

## 2016-09-23 DIAGNOSIS — Z6841 Body Mass Index (BMI) 40.0 and over, adult: Secondary | ICD-10-CM | POA: Diagnosis not present

## 2016-09-23 DIAGNOSIS — I110 Hypertensive heart disease with heart failure: Secondary | ICD-10-CM | POA: Diagnosis not present

## 2016-09-23 DIAGNOSIS — G4733 Obstructive sleep apnea (adult) (pediatric): Secondary | ICD-10-CM | POA: Diagnosis not present

## 2016-09-23 DIAGNOSIS — F329 Major depressive disorder, single episode, unspecified: Secondary | ICD-10-CM | POA: Diagnosis present

## 2016-09-23 DIAGNOSIS — E876 Hypokalemia: Secondary | ICD-10-CM | POA: Diagnosis present

## 2016-09-23 HISTORY — DX: Malignant neoplasm of thyroid gland: C73

## 2016-09-23 LAB — BASIC METABOLIC PANEL
Anion gap: 16 — ABNORMAL HIGH (ref 5–15)
Anion gap: 13 (ref 5–15)
BUN: 23 mg/dL — ABNORMAL HIGH (ref 6–20)
BUN: 21 mg/dL — AB (ref 6–20)
CALCIUM: 6.9 mg/dL — AB (ref 8.9–10.3)
CHLORIDE: 95 mmol/L — AB (ref 101–111)
CO2: 27 mmol/L (ref 22–32)
CO2: 28 mmol/L (ref 22–32)
CREATININE: 1.05 mg/dL — AB (ref 0.44–1.00)
CREATININE: 0.96 mg/dL (ref 0.44–1.00)
Calcium: 7.1 mg/dL — ABNORMAL LOW (ref 8.9–10.3)
Chloride: 93 mmol/L — ABNORMAL LOW (ref 101–111)
GFR calc Af Amer: >60 mL/min (ref >60)
GFR calc non Af Amer: 58 mL/min — ABNORMAL LOW (ref >60)
GFR calc non Af Amer: >60 mL/min (ref >60)
GLUCOSE: 150 mg/dL — AB (ref 65–99)
Glucose, Bld: 98 mg/dL (ref 65–99)
Potassium: 3.4 mmol/L — ABNORMAL LOW (ref 3.5–5.1)
Potassium: 3 mmol/L — ABNORMAL LOW (ref 3.5–5.1)
Sodium: 136 mmol/L (ref 135–145)
Sodium: 136 mmol/L (ref 135–145)

## 2016-09-23 LAB — CBC WITH DIFFERENTIAL/PLATELET
Basophils Absolute: 0 10*3/uL (ref 0.0–0.1)
Basophils Relative: 0 %
EOS ABS: 0.3 10*3/uL (ref 0.0–0.7)
EOS PCT: 2 %
HCT: 31.9 % — ABNORMAL LOW (ref 36.0–46.0)
Hemoglobin: 9.6 g/dL — ABNORMAL LOW (ref 12.0–15.0)
LYMPHS ABS: 2.8 10*3/uL (ref 0.7–4.0)
LYMPHS PCT: 14 %
MCH: 25.6 pg — AB (ref 26.0–34.0)
MCHC: 30.1 g/dL (ref 30.0–36.0)
MCV: 85.1 fL (ref 78.0–100.0)
MONO ABS: 1 10*3/uL (ref 0.1–1.0)
MONOS PCT: 5 %
Neutro Abs: 15.4 10*3/uL — ABNORMAL HIGH (ref 1.7–7.7)
Neutrophils Relative %: 79 %
Platelets: 314 10*3/uL (ref 150–400)
RBC: 3.75 MIL/uL — AB (ref 3.87–5.11)
RDW: 16.3 % — AB (ref 11.5–15.5)
WBC: 19.5 10*3/uL — ABNORMAL HIGH (ref 4.0–10.5)

## 2016-09-23 LAB — CBC
HCT: 32.8 % — ABNORMAL LOW (ref 36.0–46.0)
Hemoglobin: 10.1 g/dL — ABNORMAL LOW (ref 12.0–15.0)
MCH: 26 pg (ref 26.0–34.0)
MCHC: 30.8 g/dL (ref 30.0–36.0)
MCV: 84.5 fL (ref 78.0–100.0)
PLATELETS: 327 10*3/uL (ref 150–400)
RBC: 3.88 MIL/uL (ref 3.87–5.11)
RDW: 16.1 % — ABNORMAL HIGH (ref 11.5–15.5)
WBC: 15.5 10*3/uL — AB (ref 4.0–10.5)

## 2016-09-23 LAB — LIPID PANEL
CHOLESTEROL: 167 mg/dL (ref 0–200)
HDL: 39 mg/dL — AB (ref >40)
LDL CALC: 93 mg/dL (ref 0–99)
TRIGLYCERIDES: 176 mg/dL — AB (ref <150)
Total CHOL/HDL Ratio: 4.3 RATIO
VLDL: 35 mg/dL (ref 0–40)

## 2016-09-23 LAB — MAGNESIUM
MAGNESIUM: 1.4 mg/dL — AB (ref 1.7–2.4)
Magnesium: 1.4 mg/dL — ABNORMAL LOW (ref 1.7–2.4)
Magnesium: 2.6 mg/dL — ABNORMAL HIGH (ref 1.7–2.4)

## 2016-09-23 LAB — GLUCOSE, CAPILLARY
Glucose-Capillary: 98 mg/dL (ref 65–99)
Glucose-Capillary: 127 mg/dL — ABNORMAL HIGH (ref 65–99)
Glucose-Capillary: 143 mg/dL — ABNORMAL HIGH (ref 65–99)
Glucose-Capillary: 109 mg/dL — ABNORMAL HIGH (ref 65–99)

## 2016-09-23 LAB — COMPREHENSIVE METABOLIC PANEL
ALT: 13 U/L — AB (ref 14–54)
ANION GAP: 14 (ref 5–15)
AST: 18 U/L (ref 15–41)
Albumin: 3.4 g/dL — ABNORMAL LOW (ref 3.5–5.0)
Alkaline Phosphatase: 66 U/L (ref 38–126)
BUN: 26 mg/dL — ABNORMAL HIGH (ref 6–20)
CHLORIDE: 97 mmol/L — AB (ref 101–111)
CO2: 24 mmol/L (ref 22–32)
CREATININE: 1.36 mg/dL — AB (ref 0.44–1.00)
Calcium: 6.3 mg/dL — CL (ref 8.9–10.3)
GFR, EST AFRICAN AMERICAN: 49 mL/min — AB (ref >60)
GFR, EST NON AFRICAN AMERICAN: 43 mL/min — AB (ref >60)
Glucose, Bld: 109 mg/dL — ABNORMAL HIGH (ref 65–99)
Potassium: 3.7 mmol/L (ref 3.5–5.1)
SODIUM: 135 mmol/L (ref 135–145)
Total Bilirubin: 0.4 mg/dL (ref 0.3–1.2)
Total Protein: 7 g/dL (ref 6.5–8.1)

## 2016-09-23 LAB — URINALYSIS, ROUTINE W REFLEX MICROSCOPIC
BILIRUBIN URINE: NEGATIVE
GLUCOSE, UA: NEGATIVE mg/dL
Hgb urine dipstick: NEGATIVE
KETONES UR: NEGATIVE mg/dL
Leukocytes, UA: NEGATIVE
NITRITE: NEGATIVE
PH: 5 (ref 5.0–8.0)
PROTEIN: NEGATIVE mg/dL
Specific Gravity, Urine: 1.012 (ref 1.005–1.030)

## 2016-09-23 LAB — HEPARIN LEVEL (UNFRACTIONATED): HEPARIN UNFRACTIONATED: 0.13 [IU]/mL — AB (ref 0.30–0.70)

## 2016-09-23 LAB — TROPONIN I
TROPONIN I: 0.03 ng/mL — AB (ref <0.03)
Troponin I: <0.03 ng/mL (ref <0.03)
Troponin I: <0.03 ng/mL (ref <0.03)

## 2016-09-23 LAB — CREATININE, URINE, RANDOM: Creatinine, Urine: 67.12 mg/dL

## 2016-09-23 LAB — T4, FREE: FREE T4: 0.99 ng/dL (ref 0.61–1.12)

## 2016-09-23 LAB — BRAIN NATRIURETIC PEPTIDE: B NATRIURETIC PEPTIDE 5: 15.4 pg/mL (ref 0.0–100.0)

## 2016-09-23 LAB — TSH: TSH: 6.713 u[IU]/mL — AB (ref 0.350–4.500)

## 2016-09-23 LAB — D-DIMER, QUANTITATIVE (NOT AT ARMC): D DIMER QUANT: 1.21 ug{FEU}/mL — AB (ref 0.00–0.50)

## 2016-09-23 MED ORDER — CALCIUM GLUCONATE 10 % IV SOLN
1.0000 g | Freq: Once | INTRAVENOUS | Status: DC
  Filled 2016-09-23: qty 10

## 2016-09-23 MED ORDER — NICOTINE 14 MG/24HR TD PT24
14.0000 mg | MEDICATED_PATCH | Freq: Every day | TRANSDERMAL | Status: DC
  Administered 2016-09-23 – 2016-09-25 (×3): 14 mg via TRANSDERMAL
  Filled 2016-09-23 (×4): qty 1

## 2016-09-23 MED ORDER — MIRTAZAPINE 15 MG PO TABS
15.0000 mg | ORAL_TABLET | Freq: Every day | ORAL | Status: DC
  Administered 2016-09-23 – 2016-09-24 (×2): 15 mg via ORAL
  Filled 2016-09-23 (×2): qty 1

## 2016-09-23 MED ORDER — PANTOPRAZOLE SODIUM 40 MG PO TBEC
40.0000 mg | DELAYED_RELEASE_TABLET | Freq: Every day | ORAL | Status: DC
  Administered 2016-09-23 – 2016-09-25 (×3): 40 mg via ORAL
  Filled 2016-09-23 (×3): qty 1

## 2016-09-23 MED ORDER — BENZONATATE 100 MG PO CAPS
200.0000 mg | ORAL_CAPSULE | Freq: Three times a day (TID) | ORAL | Status: DC | PRN
  Administered 2016-09-24 – 2016-09-25 (×2): 200 mg via ORAL
  Filled 2016-09-23 (×2): qty 2

## 2016-09-23 MED ORDER — HYDROCODONE-ACETAMINOPHEN 5-325 MG PO TABS
1.0000 | ORAL_TABLET | ORAL | Status: DC | PRN
  Administered 2016-09-23 – 2016-09-25 (×5): 2 via ORAL
  Filled 2016-09-23 (×5): qty 2

## 2016-09-23 MED ORDER — INSULIN ASPART 100 UNIT/ML ~~LOC~~ SOLN: 0.0000 [IU] | Freq: Every day | SUBCUTANEOUS | Status: DC

## 2016-09-23 MED ORDER — CALCIUM CARBONATE ANTACID 500 MG PO CHEW
2.0000 | CHEWABLE_TABLET | Freq: Three times a day (TID) | ORAL | Status: DC
  Administered 2016-09-23 – 2016-09-24 (×6): 400 mg via ORAL
  Filled 2016-09-23 (×10): qty 2

## 2016-09-23 MED ORDER — LORATADINE 10 MG PO TABS: 10.0000 mg | ORAL_TABLET | Freq: Every day | ORAL | Status: DC | PRN

## 2016-09-23 MED ORDER — SODIUM CHLORIDE 0.9 % IV SOLN
2.0000 g | Freq: Once | INTRAVENOUS | Status: AC
  Administered 2016-09-23: 2 g via INTRAVENOUS
  Filled 2016-09-23: qty 20

## 2016-09-23 MED ORDER — DICYCLOMINE HCL 20 MG PO TABS
20.0000 mg | ORAL_TABLET | Freq: Two times a day (BID) | ORAL | Status: DC | PRN
  Filled 2016-09-23: qty 1

## 2016-09-23 MED ORDER — ACETAMINOPHEN 325 MG PO TABS
650.0000 mg | ORAL_TABLET | ORAL | Status: DC | PRN
  Filled 2016-09-23: qty 2

## 2016-09-23 MED ORDER — TECHNETIUM TC 99M DIETHYLENETRIAME-PENTAACETIC ACID: 31.8000 | Freq: Once | INTRAVENOUS | Status: DC | PRN

## 2016-09-23 MED ORDER — ONDANSETRON HCL 4 MG/2ML IJ SOLN: 4.0000 mg | Freq: Four times a day (QID) | INTRAMUSCULAR | Status: DC | PRN

## 2016-09-23 MED ORDER — POTASSIUM CHLORIDE CRYS ER 20 MEQ PO TBCR
40.0000 meq | EXTENDED_RELEASE_TABLET | Freq: Once | ORAL | Status: AC
  Administered 2016-09-23: 40 meq via ORAL
  Filled 2016-09-23: qty 2

## 2016-09-23 MED ORDER — CALCITRIOL 0.5 MCG PO CAPS
0.5000 ug | ORAL_CAPSULE | Freq: Every day | ORAL | Status: DC
  Administered 2016-09-23 – 2016-09-25 (×3): 0.5 ug via ORAL
  Filled 2016-09-23 (×3): qty 1

## 2016-09-23 MED ORDER — PRAVASTATIN SODIUM 40 MG PO TABS
40.0000 mg | ORAL_TABLET | Freq: Every day | ORAL | Status: DC
  Administered 2016-09-23 – 2016-09-24 (×2): 40 mg via ORAL
  Filled 2016-09-23 (×2): qty 1

## 2016-09-23 MED ORDER — MAGNESIUM SULFATE IN D5W 1-5 GM/100ML-% IV SOLN
1.0000 g | Freq: Once | INTRAVENOUS | Status: DC
  Filled 2016-09-23 (×2): qty 100

## 2016-09-23 MED ORDER — SODIUM CHLORIDE 0.9 % IV SOLN: INTRAVENOUS | Status: DC

## 2016-09-23 MED ORDER — COLCHICINE 0.6 MG PO TABS
0.6000 mg | ORAL_TABLET | Freq: Two times a day (BID) | ORAL | Status: DC
  Administered 2016-09-23 – 2016-09-25 (×5): 0.6 mg via ORAL
  Filled 2016-09-23 (×5): qty 1

## 2016-09-23 MED ORDER — TIOTROPIUM BROMIDE MONOHYDRATE 18 MCG IN CAPS
18.0000 ug | ORAL_CAPSULE | Freq: Every day | RESPIRATORY_TRACT | Status: DC
  Administered 2016-09-23 – 2016-09-25 (×3): 18 ug via RESPIRATORY_TRACT
  Filled 2016-09-23: qty 5

## 2016-09-23 MED ORDER — HYDRALAZINE HCL 20 MG/ML IJ SOLN: 5.0000 mg | INTRAMUSCULAR | Status: DC | PRN

## 2016-09-23 MED ORDER — POTASSIUM CHLORIDE CRYS ER 20 MEQ PO TBCR
40.0000 meq | EXTENDED_RELEASE_TABLET | ORAL | Status: AC
  Administered 2016-09-23 – 2016-09-24 (×2): 40 meq via ORAL
  Filled 2016-09-23 (×2): qty 2

## 2016-09-23 MED ORDER — ORAL CARE MOUTH RINSE
15.0000 mL | Freq: Two times a day (BID) | OROMUCOSAL | Status: DC
  Administered 2016-09-24: 15 mL via OROMUCOSAL

## 2016-09-23 MED ORDER — MAGNESIUM SULFATE 4 GM/100ML IV SOLN
4.0000 g | Freq: Once | INTRAVENOUS | Status: AC
  Administered 2016-09-23: 4 g via INTRAVENOUS
  Filled 2016-09-23: qty 100

## 2016-09-23 MED ORDER — LEVOTHYROXINE SODIUM 25 MCG PO TABS
125.0000 ug | ORAL_TABLET | Freq: Every day | ORAL | Status: DC
  Administered 2016-09-25: 125 ug via ORAL
  Filled 2016-09-23: qty 2

## 2016-09-23 MED ORDER — ENOXAPARIN SODIUM 80 MG/0.8ML ~~LOC~~ SOLN
0.5000 mg/kg | SUBCUTANEOUS | Status: DC
  Administered 2016-09-24: 65 mg via SUBCUTANEOUS
  Filled 2016-09-23: qty 0.8

## 2016-09-23 MED ORDER — CLONAZEPAM 1 MG PO TABS
2.0000 mg | ORAL_TABLET | Freq: Two times a day (BID) | ORAL | Status: DC
  Administered 2016-09-23 – 2016-09-25 (×5): 2 mg via ORAL
  Filled 2016-09-23 (×5): qty 2

## 2016-09-23 MED ORDER — ZOLPIDEM TARTRATE 5 MG PO TABS: 5.0000 mg | ORAL_TABLET | Freq: Every evening | ORAL | Status: DC | PRN

## 2016-09-23 MED ORDER — FUROSEMIDE 10 MG/ML IJ SOLN
40.0000 mg | Freq: Once | INTRAMUSCULAR | Status: AC
  Administered 2016-09-23: 40 mg via INTRAVENOUS
  Filled 2016-09-23: qty 4

## 2016-09-23 MED ORDER — ALLOPURINOL 100 MG PO TABS
100.0000 mg | ORAL_TABLET | Freq: Every day | ORAL | Status: DC
  Administered 2016-09-23 – 2016-09-25 (×3): 100 mg via ORAL
  Filled 2016-09-23 (×3): qty 1

## 2016-09-23 MED ORDER — CARVEDILOL 12.5 MG PO TABS
6.2500 mg | ORAL_TABLET | Freq: Two times a day (BID) | ORAL | Status: DC
  Administered 2016-09-23 – 2016-09-25 (×5): 6.25 mg via ORAL
  Filled 2016-09-23 (×5): qty 1

## 2016-09-23 MED ORDER — INSULIN ASPART 100 UNIT/ML ~~LOC~~ SOLN
0.0000 [IU] | Freq: Three times a day (TID) | SUBCUTANEOUS | Status: DC
  Administered 2016-09-23 – 2016-09-24 (×3): 1 [IU] via SUBCUTANEOUS

## 2016-09-23 MED ORDER — ACETAMINOPHEN-CODEINE #3 300-30 MG PO TABS: 1.0000 | ORAL_TABLET | Freq: Four times a day (QID) | ORAL | Status: DC | PRN

## 2016-09-23 MED ORDER — ALBUTEROL SULFATE (2.5 MG/3ML) 0.083% IN NEBU: 2.5000 mg | INHALATION_SOLUTION | RESPIRATORY_TRACT | Status: DC | PRN

## 2016-09-23 MED ORDER — ACETAMINOPHEN 325 MG PO TABS: 650.0000 mg | ORAL_TABLET | ORAL | Status: DC | PRN

## 2016-09-23 MED ORDER — IOPAMIDOL (ISOVUE-300) INJECTION 61%
INTRAVENOUS | Status: AC
  Filled 2016-09-23: qty 100

## 2016-09-23 MED ORDER — MORPHINE SULFATE (PF) 4 MG/ML IV SOLN: 2.0000 mg | INTRAVENOUS | Status: DC | PRN

## 2016-09-23 MED ORDER — MOMETASONE FURO-FORMOTEROL FUM 100-5 MCG/ACT IN AERO
2.0000 | INHALATION_SPRAY | Freq: Two times a day (BID) | RESPIRATORY_TRACT | Status: DC
  Administered 2016-09-23 – 2016-09-25 (×5): 2 via RESPIRATORY_TRACT
  Filled 2016-09-23: qty 8.8

## 2016-09-23 MED ORDER — EPINEPHRINE 0.3 MG/0.3ML IJ SOAJ
0.3000 mg | Freq: Every day | INTRAMUSCULAR | Status: DC | PRN
  Filled 2016-09-23: qty 0.3

## 2016-09-23 MED ORDER — NITROGLYCERIN 0.4 MG SL SUBL: 0.4000 mg | SUBLINGUAL_TABLET | SUBLINGUAL | Status: DC | PRN

## 2016-09-23 MED ORDER — HEPARIN (PORCINE) IN NACL 100-0.45 UNIT/ML-% IJ SOLN
1450.0000 [IU]/h | INTRAMUSCULAR | Status: DC
  Administered 2016-09-23: 1450 [IU]/h via INTRAVENOUS
  Filled 2016-09-23 (×2): qty 250

## 2016-09-23 MED ORDER — ENOXAPARIN SODIUM 40 MG/0.4ML ~~LOC~~ SOLN: 40.0000 mg | SUBCUTANEOUS | Status: DC

## 2016-09-23 MED ORDER — LEVOTHYROXINE SODIUM 100 MCG PO TABS
100.0000 ug | ORAL_TABLET | Freq: Every day | ORAL | Status: DC
  Administered 2016-09-23: 100 ug via ORAL
  Filled 2016-09-23: qty 1

## 2016-09-23 MED ORDER — HEPARIN BOLUS VIA INFUSION
5000.0000 [IU] | Freq: Once | INTRAVENOUS | Status: AC
  Administered 2016-09-23: 5000 [IU] via INTRAVENOUS
  Filled 2016-09-23: qty 5000

## 2016-09-23 MED ORDER — TECHNETIUM TO 99M ALBUMIN AGGREGATED
4.2000 | Freq: Once | INTRAVENOUS | Status: AC | PRN
  Administered 2016-09-23: 4.2 via INTRAVENOUS

## 2016-09-23 MED ORDER — LORAZEPAM 1 MG PO TABS
1.0000 mg | ORAL_TABLET | Freq: Once | ORAL | Status: AC
  Administered 2016-09-23: 1 mg via ORAL
  Filled 2016-09-23: qty 1

## 2016-09-23 NOTE — Progress Notes (Signed)
PROGRESS NOTE   Michele Owens  RJJ:[id number]    DOB: [birth year]    DOA: 09/22/2016  PCP: [name], MD   I have briefly reviewed patients previous medical records in [facility].  Brief Narrative:  56 year old female status post total thyroidectomy (for thyroid neoplasm of uncertain behavior) on 09/19/16, no pathology shows medullary carcinoma and papillary carcinoma, discharge from the hospital on 09/20/16, HTN, HLD, DM 2, COPD on home oxygen 2 L/m, GERD, hypothyroid, gout, remote DVT, anxiety and depression, chronic systolic CHF (EF 01-60 percent), obesity, tobacco abuse presented with leg cramps and atypical chest pain. Day after she returned home she started developing intermittent bilateral mostly when she bent down to do something. Since symptoms persisted, she decided to call EMS. In the ambulance, she developed left-sided chest pain that was reproducible to palpation by EMT, resolved after undetermined amount of time and has not recurred. No dyspnea. Mild intermittent nonproductive cough. Noted to have hypocalcemia, acute kidney injury elevated d-dimer, negative BNP and troponin, CT chest showed bibasal atelectasis. Admitted for further evaluation and management.   Assessment & Plan:   Principal Problem:   Hypocalcemia Active Problems:   Controlled type 2 diabetes mellitus without complication, without long-term current use of insulin (HCC)   Benign essential HTN   Chest pain   Chronic combined systolic (congestive) and diastolic (congestive) heart failure (HCC)   HLD (hyperlipidemia)   COPD (chronic obstructive pulmonary disease) (HCC)   Gout   DVT (deep vein thrombosis) in pregnancy ([place])   Thyroid cancer ([place])   AKI (acute kidney injury) ([place])   1. Symptomatic hypocalcemia: Likely related to recent total thyroidectomy. As per op note 6/12: Parathyroid glands were identified and preserved. Received a dose of IV calcium gluconate prior to discharge. Was discharged on  oral calcium supplements. Presented with calcium of 6.3. Replace calcium IV and by mouth and follow closely. Replace magnesium and potassium. 2. Hypomagnesemia: Magnesium 1.4. Replace aggressively and follow. 3. Hypokalemia: Replace and follow. 4. Atypical chest pain: Suspect musculoskeletal etiology. Troponin initially 0.03 but subsequent 2 readings negative. Supportive treatment. Low index of suspicion for PE but given elevated d-dimer, follow VQ scan. DC IV heparin if that's negative. 5. Acute kidney injury: Resolved after IV fluids. 6. Type II DM: Last A1c 09/13/16:5.9. Hold metformin. SSI. 7. Essential hypertension: Soft blood pressures. Holding Lasix and lisinopril. Continue carvedilol. When necessary IV hydralazine. 8. Chronic combined systolic and diastolic CHF: 2-D echo 04/18/30: LVEF 45-50 percent and grade 2 diastolic dysfunction. BNP 15.4. Compensated. Temporarily hold Lasix. Did receive a dose in the ED. 9. Hyperlipidemia: Pravastatin. 10. COPD: Stable without clinical bronchospasm. 11. Gout: No acute flare. Continue allopurinol and colchicine. 12. Remote history of DVT in pregnancy: No clinical features to suspect DVT. 13. Thyroid cancer, status post total thyroidectomy 6/12: TSH 6.713. Synthroid increased from 100 > 125 g daily. TSH in 4-6 weeks. Pathology results as below-defer to surgery regarding further management. Recent surgical site without acute findings. Discussed and informed surgical team of patient's admission. 14. Depression and anxiety: Stable. 15. Tobacco abuse: Nicotine patch. Cessation counseled. 16. Anemia: Stable. Follow CBCs. 17. Leukocytosis: Seems chronic, unclear etiology. Some of it may also be due to stress response. No clinical focus of sepsis. Follow CBCs. 18. Basilar atelectasis: Incentive spirometry.   DVT prophylaxis: IV heparin drip. Code Status: Full Family Communication: None at bedside Disposition: DC home when medically improved   Consultants:   None   Procedures:  None  Antimicrobials:  None    Subjective: Ongoing bilateral leg cramps-unchanged. No chest pain recurrence since ED. Denies dyspnea. Mild intermittent dry cough. Mild headache-states she has migraines at home. No blurred vision.  ROS: No dizziness, lightheadedness.  Objective:  Vitals:   09/23/16 0230 09/23/16 0332 09/23/16 0416 09/23/16 0911  BP: (!) 97/57 112/87 (!) 95/56   Pulse: 87  86   Resp: 13  18   Temp:   98.4 F (36.9 C)   TempSrc:   Oral   SpO2: 93%  94% 93%  Weight:   125.9 kg (277 lb 8 oz)   Height:   5\' 4"  (1.626 m)     Examination:  General exam: Pleasant middle-aged female, moderately built and obese, sitting up comfortably in bed. Neck: Postop thyroidectomy site without acute findings. Respiratory system: Diminished breath sounds in the bases. Rest of lung fields clear to auscultation. Reproducible left mid chest tenderness.Marland Kitchen Respiratory effort normal. Cardiovascular system: S1 & S2 heard, RRR. No JVD, murmurs, rubs, gallops or clicks. Trace ankle edema. Telemetry: Sinus rhythm. Gastrointestinal system: Abdomen is nondistended, soft and nontender. No organomegaly or masses felt. Normal bowel sounds heard. Central nervous system: Alert and oriented. No focal neurological deficits. Extremities: Symmetric 5 x 5 power. Skin: No rashes, lesions or ulcers Psychiatry: Judgement and insight appear normal. Mood & affect appropriate.     Data Reviewed: I have personally reviewed following labs and imaging studies  CBC:  Recent Labs Lab 09/22/16 2338 09/23/16 0848  WBC 19.5* 15.5*  NEUTROABS 15.4*  --   HGB 9.6* 10.1*  HCT 31.9* 32.8*  MCV 85.1 84.5  PLT 314 614   Basic Metabolic Panel:  Recent Labs Lab 09/20/16 0544 09/22/16 2338 09/23/16 0449 09/23/16 0848  NA 135 135  --  136  K 4.0 3.7  --  3.4*  CL 92* 97*  --  93*  CO2 27 24  --  27  GLUCOSE 94 109*  --  98  BUN 10 26*  --  23*  CREATININE 0.64 1.36*  --  1.05*   CALCIUM 8.1* 6.3*  --  6.9*  MG  --  1.4* 1.4*  --    Liver Function Tests:  Recent Labs Lab 09/22/16 2338  AST 18  ALT 13*  ALKPHOS 66  BILITOT 0.4  PROT 7.0  ALBUMIN 3.4*   Cardiac Enzymes:  Recent Labs Lab 09/22/16 2338 09/23/16 0449 09/23/16 0848  TROPONINI 0.03* <0.03 <0.03   CBG:  Recent Labs Lab 09/19/16 2115 09/20/16 0753 09/23/16 0002 09/23/16 0648 09/23/16 1117  GLUCAP 126* 108* 119* 98 127*          Radiology Studies: Dg Chest 2 View  Result Date: 09/23/2016 CLINICAL DATA:  Chest pain EXAM: CHEST  2 VIEW COMPARISON:  08/23/2016, 07/21/2016, 07/20/2016 FINDINGS: Cardiomegaly. Development of bibasilar atelectasis or pneumonia. Possible small effusions. Interim finding of a right convex hilar enlargement. No pneumothorax. Surgical clips at the thoracic inlet. IMPRESSION: 1. Cardiomegaly with development of bibasilar atelectasis or pneumonia 2. Interval finding of convex right hilar enlargement ; uncertain if this is due to enlarged hilar vessels, vascular abnormality, or mass. CT is recommended for further evaluation. 3. Interval postsurgical changes at the thoracic inlet. Electronically Signed   By: Donavan Foil M.D.   On: 09/23/2016 00:53   Ct Chest W Contrast  Result Date: 09/23/2016 CLINICAL DATA:  Acute onset of generalized abdominal cramping and weakness. Left-sided chest pain and intermittent shortness of breath. Recent thyroid surgery. EXAM: CT CHEST  WITH CONTRAST TECHNIQUE: Multidetector CT imaging of the chest was performed during intravenous contrast administration. CONTRAST:  75 mL of Isovue 300 IV contrast COMPARISON:  Chest radiograph performed earlier today at 12:24 a.m., and CT of the chest performed 01/23/2016 FINDINGS: Cardiovascular: The heart is borderline enlarged. Minimal calcification is seen at the aortic arch. The great vessels are within normal limits. Mediastinum/Nodes: The mediastinum is otherwise unremarkable in appearance. No  mediastinal lymphadenopathy is seen. No pericardial effusion is identified. Prominent soft tissue edema is noted about the thyroid bed, reflecting recent thyroidectomy. No axillary lymphadenopathy is appreciated. Lungs/Pleura: Patchy bibasilar airspace opacities most likely reflect atelectasis, though infection might conceivably have a similar appearance. No pleural effusion or pneumothorax is seen. An apparent 7 mm nodule is noted at the medial aspect of the right lung base (image 78 of 144). This is less prominent than in 2017 and may simply reflect atelectasis. Upper Abdomen: The visualized portions of the liver and spleen are grossly unremarkable. The visualized portions of the pancreas, adrenal glands and kidneys are within normal limits. Musculoskeletal: No acute osseous abnormalities are identified. Anterior bridging osteophytes are noted along the lower thoracic spine. The visualized musculature is unremarkable in appearance. IMPRESSION: 1. Patchy bibasilar airspace opacities most likely reflect atelectasis, though infection might have a similar appearance, depending on the patient's symptoms. 2. Borderline cardiomegaly. 3. Prominent soft tissue edema about the thyroid bed, reflecting recent thyroidectomy. Electronically Signed   By: Garald Balding M.D.   On: 09/23/2016 03:34        Scheduled Meds: . allopurinol  100 mg Oral Daily  . calcitRIOL  0.5 mcg Oral Daily  . calcium carbonate  2 tablet Oral TID  . carvedilol  6.25 mg Oral BID WC  . clonazePAM  2 mg Oral BID  . colchicine  0.6 mg Oral BID  . insulin aspart  0-5 Units Subcutaneous QHS  . insulin aspart  0-9 Units Subcutaneous TID WC  . iopamidol      . [START ON 09/24/2016] levothyroxine  125 mcg Oral QAC breakfast  . mouth rinse  15 mL Mouth Rinse BID  . mirtazapine  15 mg Oral QHS  . mometasone-formoterol  2 puff Inhalation BID  . nicotine  14 mg Transdermal Daily  . pantoprazole  40 mg Oral Daily  . potassium chloride  40 mEq  Oral Once  . pravastatin  40 mg Oral q1800  . tiotropium  18 mcg Inhalation Daily   Continuous Infusions: . sodium chloride    . calcium gluconate    . heparin 1,450 Units/hr (09/23/16 0522)     LOS: 0 days     HONGALGI,ANAND, MD, FACP, FHM. [facility] Pager [phone]  If 7PM-7AM, please contact night-coverage [email or web address] Password [facility] 09/23/2016, 11:58 AM

## 2016-09-23 NOTE — ED Notes (Signed)
Pt requested po fluids, pt was provided , ice chips, and gingerale, pt also requesting a coke which was provided

## 2016-09-23 NOTE — Progress Notes (Signed)
Addendum  VQ scan results appreciated. Limited study but very low probability for pulmonary embolism. Lower extremity venous duplex: No DVT.  Discontinued IV heparin drip. Lovenox for DVT prophylaxis. If electrolytes are replaced and stable, could possibly DC home 09/24/16. 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 - SEE ONCOLOGY TABLE BELOW.  Vernell Leep, MD, FACP, FHM. [facility] Pager [phone]  If 7PM-7AM, please contact night-coverage [email or web address] Password [facility] 09/23/2016, 4:13 PM

## 2016-09-23 NOTE — Progress Notes (Signed)
ANTICOAGULATION CONSULT NOTE - Initial Consult  Pharmacy Consult for Heparin Indication: r/o PE  Allergies  Allergen Reactions  . Bee Venom Swelling and Other (See Comments)    "All over my body" (swelling)  . Ibuprofen Rash and Other (See Comments)    Severe rash  . Lamisil [Terbinafine Hcl] Rash and Other (See Comments)    Pt states this causes her to "feel funny"  . Nsaids Other (See Comments)    Per MD's orders     Patient Measurements: Height: 5\' 4"  (162.6 cm) Weight: 277 lb 8 oz (125.9 kg) IBW/kg (Calculated) : 54.7 Heparin Dosing Weight: 86 kg  Vital Signs: Temp: 98.4 F (36.9 C) (06/16 0416) Temp Source: Oral (06/16 0416) BP: 95/56 (06/16 0416) Pulse Rate: 86 (06/16 0416)  Labs:  Recent Labs  09/20/16 0544 09/22/16 2338  HGB  --  9.6*  HCT  --  31.9*  PLT  --  314  CREATININE 0.64 1.36*  TROPONINI  --  0.03*    Estimated Creatinine Clearance: 60.7 mL/min (A) (by C-G formula based on SCr of 1.36 mg/dL (H)).   Medical History: Past Medical History:  Diagnosis Date  . Anxiety   . Arthritis   . Asthma   . Chronic diastolic CHF (congestive heart failure) ([place])   . COPD (chronic obstructive pulmonary disease) (HCC)    Uses Oxygen at night  . Depression   . Diabetes mellitus without complication ([place])   . Dyspnea    occ  . GERD (gastroesophageal reflux disease)   . Gout   . Headache    migraines  . History of DVT of lower extremity   . History of nuclear stress test    Myoview 2/17:  Low risk stress nuclear study with a small, moderate intensity, partially reversible inferior lateral defect consistent with small prior infarct and minimal peri-infarct ischemia; EF 68 with normal wall motion  . Hypertension   . Pneumonia   . Thyroid cancer ([place]) 09/23/2016    Medications:  Prescriptions Prior to Admission  Medication Sig Dispense Refill Last Dose  . acetaminophen (TYLENOL) 325 MG tablet Take 2 tablets (650 mg total) by mouth every 4 (four) hours  as needed for headache or mild pain.   Past Week at Unknown time  . Acetaminophen-Codeine (TYLENOL/CODEINE #3) 300-30 MG tablet Take 1 tablet by mouth every 6 (six) hours as needed for pain.   09/22/2016 at Unknown time  . albuterol (ACCUNEB) 1.25 MG/3ML nebulizer solution Take 1 ampule by nebulization 2 (two) times daily.   09/22/2016 at Unknown time  . albuterol (PROVENTIL HFA;VENTOLIN HFA) 108 (90 BASE) MCG/ACT inhaler Inhale 1-2 puffs into the lungs every 6 (six) hours as needed for wheezing. 1 Inhaler 0 09/22/2016 at Unknown time  . allopurinol (ZYLOPRIM) 100 MG tablet Take 100 mg by mouth daily.   09/22/2016 at Unknown time  . benzonatate (TESSALON) 200 MG capsule Take 1 capsule (200 mg total) by mouth 3 (three) times daily as needed for cough. 30 capsule 0 Past Month at Unknown time  . budesonide-formoterol (SYMBICORT) 80-4.5 MCG/ACT inhaler Inhale 2 puffs into the lungs 2 (two) times daily. 1 Inhaler 0 09/22/2016 at Unknown time  . calcium carbonate (TUMS) 500 MG chewable tablet Chew 2 tablets (400 mg of elemental calcium total) by mouth 3 (three) times daily. 90 tablet 1 09/22/2016 at Unknown time  . carvedilol (COREG) 6.25 MG tablet TAKE ONE TABLET BY MOUTH TWICE DAILY WITH A MEAL 60 tablet 6 09/22/2016 at 0800  .  clonazePAM (KLONOPIN) 2 MG tablet Take 1 tablet (2 mg total) by mouth 2 (two) times daily. 30 tablet 0 09/22/2016 at Unknown time  . colchicine 0.6 MG tablet Take 0.6 mg by mouth 2 (two) times daily.   09/22/2016 at Unknown time  . diclofenac sodium (VOLTAREN) 1 % GEL Apply 2 g topically 4 (four) times daily as needed (for pain).    Past Week at Unknown time  . dicyclomine (BENTYL) 20 MG tablet Take 1 tablet (20 mg total) by mouth 2 (two) times daily as needed (abdominal pain/cramping). 20 tablet 0 09/22/2016 at Unknown time  . EPINEPHrine 0.3 mg/0.3 mL IJ SOAJ injection Inject 0.3 mg into the muscle daily as needed (allergic reaction).   unknown  . furosemide (LASIX) 40 MG tablet Take 2  tablets (80 mg total) by mouth 2 (two) times daily. 30 tablet 0 09/22/2016 at Unknown time  . HYDROcodone-acetaminophen (NORCO/VICODIN) 5-325 MG tablet Take 1-2 tablets by mouth every 4 (four) hours as needed for moderate pain. 20 tablet 0 09/22/2016 at Unknown time  . lisinopril (PRINIVIL,ZESTRIL) 5 MG tablet Take 1 tablet (5 mg total) by mouth daily. 30 tablet 6 09/22/2016 at Unknown time  . loratadine (CLARITIN) 10 MG tablet Take 10 mg by mouth daily as needed for allergies.    Past Week at Unknown time  . metFORMIN (GLUCOPHAGE) 500 MG tablet Take 1 tablet (500 mg total) by mouth 2 (two) times daily with a meal. 30 tablet 1 09/22/2016 at Unknown time  . mirtazapine (REMERON) 15 MG tablet Take 15 mg by mouth at bedtime.  0 09/21/2016 at Unknown time  . nicotine (NICODERM CQ - DOSED IN MG/24 HOURS) 14 mg/24hr patch Place 1 patch (14 mg total) onto the skin daily. 28 patch 0 09/22/2016 at Unknown time  . omeprazole (PRILOSEC) 40 MG capsule Take 40 mg by mouth daily.  2 09/22/2016 at Unknown time  . potassium chloride SA (K-DUR,KLOR-CON) 20 MEQ tablet Take 2 tablets (40 mEq total) by mouth daily. 60 tablet 3 09/22/2016 at Unknown time  . pravastatin (PRAVACHOL) 40 MG tablet Take 40 mg by mouth daily.   2 09/21/2016 at Unknown time  . SYNTHROID 100 MCG tablet Take 1 tablet (100 mcg total) by mouth daily. 30 tablet 3 09/22/2016 at Unknown time  . tiotropium (SPIRIVA) 18 MCG inhalation capsule Place 18 mcg into inhaler and inhale daily.   09/22/2016 at Unknown time    Assessment: 56 y.o. F presents with leg cramps and CP. To begin heparin for r/o PE. No AC PTA. D-dimer 1.21. Hgb 9.6, plt ok.   Goal of Therapy:  Heparin level 0.3-0.7 units/ml Monitor platelets by anticoagulation protocol: Yes   Plan:  Heparin IV bolus 5000 units Heparin gtt at 1450 units/hr Will f/u heparin level in 6 hours Daily heparin level and CBC   Gazelle Towe, Juanda Chance 09/23/2016,4:20 AM

## 2016-09-23 NOTE — ED Notes (Signed)
Pump that was used to hand calcium was beeping pt states she cant stand it. When Rn attempted to adjust pump pt states to stop it. meds stopped and will be restarted on the floor.

## 2016-09-23 NOTE — H&P (Signed)
History and Physical    Michele Owens BZJ:[id number] DOB: November 30, 1960 DOA: 09/22/2016  Referring MD/NP/PA:   PCP: [name], MD   Patient coming from:  The patient is coming from home.  At baseline, pt is independent for most of ADL.   Chief Complaint: Leg cramps, chest pain  HPI: Michele Owens is a 56 y.o. female with medical history significant of hypertension, hyperlipidemia, diabetes mellitus, COPD, on 2 L oxygen at home, GERD, hypothyroidism, gout, DVT, depression, anxiety, CHF with EF for 45-50 percent, obesity, thyroid cancer (s/p of surgery 09/18/16), tobacco abuse, who presents with leg cramps and chest pain.  Pt underwent total thyroidectomy due to thyroid cancer on 09/18/16 by Dr. [name]. She states that developed leg cramps since yesterday. She also has generalized weakness. She has of blurry vision when she bend over or leaning forward, but no unilateral weakness, slurred speech or hearing loss. No perioral paresthesia. Patient also reports chest pain, which is located in the substernal area, constant, 8 out of 10 in severity, nonradiating. She has SOB and cough with white mucus production. She denies fever or chills. No nausea, vomiting, diarrhea, abdominal pain, symptoms of UTI.  ED Course: pt was found to have positive d-dimer 1.21, WBC 19.5, positive troponin 0.03, BNP 15.3, negative urinalysis, TSH 6.7, acute renal injury with creatinine 1.36, temperature 99.5, oxygen saturation 95% on room air, chest x-ray showed bilateral basilar atelectasis, and convex right hilar enlargement. Pt is admitted to tele bed as inpt.   CT-chest showed 1. Patchy bibasilar airspace opacities most likely reflect atelectasis, though infection might have a similar appearance, depending on the patient's symptoms. 2. Borderline cardiomegaly. 3. Prominent soft tissue edema about the thyroid bed, reflecting recent thyroidectomy.  Review of Systems:   General: no fevers, chills, has poor  appetite, has fatigue HEENT: no blurry vision, hearing changes or sore throat Respiratory: has dyspnea, coughing, no wheezing CV: has chest pain, no palpitations GI: no nausea, vomiting, abdominal pain, diarrhea, constipation GU: no dysuria, burning on urination, increased urinary frequency, hematuria  Ext: no leg edema Neuro: no unilateral weakness, numbness, or tingling, no vision change or hearing loss Skin: no rash, no skin tear. MSK: No muscle spasm, no deformity, no limitation of range of movement in spin Heme: No easy bruising.  Travel history: No recent long distant travel.  Allergy:  Allergies  Allergen Reactions  . Bee Venom Swelling and Other (See Comments)    "All over my body" (swelling)  . Ibuprofen Rash and Other (See Comments)    Severe rash  . Lamisil [Terbinafine Hcl] Rash and Other (See Comments)    Pt states this causes her to "feel funny"  . Nsaids Other (See Comments)    Per MD's orders     Past Medical History:  Diagnosis Date  . Anxiety   . Arthritis   . Asthma   . Chronic diastolic CHF (congestive heart failure) ([place])   . COPD (chronic obstructive pulmonary disease) (HCC)    Uses Oxygen at night  . Depression   . Diabetes mellitus without complication ([place])   . Dyspnea    occ  . GERD (gastroesophageal reflux disease)   . Gout   . Headache    migraines  . History of DVT of lower extremity   . History of nuclear stress test    Myoview 2/17:  Low risk stress nuclear study with a small, moderate intensity, partially reversible inferior lateral defect consistent with small prior infarct and minimal peri-infarct  ischemia; EF 68 with normal wall motion  . Hypertension   . Pneumonia   . Thyroid cancer ([place]) 09/23/2016    Past Surgical History:  Procedure Laterality Date  . BUNIONECTOMY Bilateral   . CARDIAC CATHETERIZATION N/A 06/23/2015   Procedure: Right/Left Heart Cath and Coronary Angiography;  Surgeon: [name], MD;  Location: [facility];  Service: Cardiovascular;  Laterality: N/A;  . COLONOSCOPY WITH PROPOFOL N/A 12/15/2015   Procedure: COLONOSCOPY WITH PROPOFOL;  Surgeon: [name], MD;  Location: [facility];  Service: Endoscopy;  Laterality: N/A;  . THYROIDECTOMY  09/19/2016  . THYROIDECTOMY N/A 09/19/2016   Procedure: TOTAL THYROIDECTOMY;  Surgeon: [name], MD;  Location: [facility];  Service: General;  Laterality: N/A;  . TONSILLECTOMY    . TOTAL ABDOMINAL HYSTERECTOMY  07/14/10    Social History:  reports that she quit smoking about 2 months ago. Her smoking use included Cigarettes. She has a 20.50 pack-year smoking history. She has never used smokeless tobacco. She reports that she drinks alcohol. She reports that she uses drugs, including Marijuana.  Family History:  Family History  Problem Relation Age of Onset  . Cancer Father   . Diabetes Mother      Prior to Admission medications   Medication Sig Start Date End Date Taking? Authorizing Provider  acetaminophen (TYLENOL) 325 MG tablet Take 2 tablets (650 mg total) by mouth every 4 (four) hours as needed for headache or mild pain. 06/24/15  Yes Shirley Friar, PA-C  Acetaminophen-Codeine (TYLENOL/CODEINE #3) 300-30 MG tablet Take 1 tablet by mouth every 6 (six) hours as needed for pain.   Yes [provider]  albuterol (ACCUNEB) 1.25 MG/3ML nebulizer solution Take 1 ampule by nebulization 2 (two) times daily.   Yes [provider]  albuterol (PROVENTIL HFA;VENTOLIN HFA) 108 (90 BASE) MCG/ACT inhaler Inhale 1-2 puffs into the lungs every 6 (six) hours as needed for wheezing. 04/09/12  Yes Kindl, Nelda Severe, MD  allopurinol (ZYLOPRIM) 100 MG tablet Take 100 mg by mouth daily.   Yes [provider]  benzonatate (TESSALON) 200 MG capsule Take 1 capsule (200 mg total) by mouth 3 (three) times daily as needed for cough. 07/24/16  Yes Lavina Hamman, MD  budesonide-formoterol [facility]) 80-4.5 MCG/ACT inhaler Inhale 2 puffs  into the lungs 2 (two) times daily. 07/24/16  Yes Lavina Hamman, MD  calcium carbonate (TUMS) 500 MG chewable tablet Chew 2 tablets (400 mg of elemental calcium total) by mouth 3 (three) times daily. 09/20/16  Yes [name], MD  carvedilol (COREG) 6.25 MG tablet TAKE ONE TABLET BY MOUTH TWICE DAILY WITH A MEAL 08/22/16  Yes Bensimhon, [name], MD  clonazePAM (KLONOPIN) 2 MG tablet Take 1 tablet (2 mg total) by mouth 2 (two) times daily. 01/27/16  Yes Mikhail, Maryann, DO  colchicine 0.6 MG tablet Take 0.6 mg by mouth 2 (two) times daily.   Yes [provider]  diclofenac sodium (VOLTAREN) 1 % GEL Apply 2 g topically 4 (four) times daily as needed (for pain).    Yes [provider]  dicyclomine (BENTYL) 20 MG tablet Take 1 tablet (20 mg total) by mouth 2 (two) times daily as needed (abdominal pain/cramping). 11/28/15  Yes Antonietta Breach, PA-C  EPINEPHrine 0.3 mg/0.3 mL IJ SOAJ injection Inject 0.3 mg into the muscle daily as needed (allergic reaction).   Yes [provider]  furosemide (LASIX) 40 MG tablet Take 2 tablets (80 mg total) by mouth 2 (  two) times daily. 08/01/16  Yes [name], MD  HYDROcodone-acetaminophen (NORCO/VICODIN) 5-325 MG tablet Take 1-2 tablets by mouth every 4 (four) hours as needed for moderate pain. 09/20/16  Yes [name], MD  lisinopril (PRINIVIL,ZESTRIL) 5 MG tablet Take 1 tablet (5 mg total) by mouth daily. 08/01/16  Yes [name], MD  loratadine (CLARITIN) 10 MG tablet Take 10 mg by mouth daily as needed for allergies.    Yes [provider]  metFORMIN (GLUCOPHAGE) 500 MG tablet Take 1 tablet (500 mg total) by mouth 2 (two) times daily with a meal. 12/11/14  Yes [name], MD  mirtazapine (REMERON) 15 MG tablet Take 15 mg by mouth at bedtime. 11/16/14  Yes [provider]  nicotine (NICODERM CQ - DOSED IN MG/24 HOURS) 14 mg/24hr patch Place 1 patch (14 mg total) onto the skin daily. 07/25/16  Yes Lavina Hamman, MD  omeprazole (PRILOSEC) 40 MG capsule Take 40 mg by mouth daily. 04/13/15  Yes [provider]  potassium chloride SA (K-DUR,KLOR-CON) 20 MEQ tablet Take 2 tablets (40 mEq total) by mouth daily. 08/01/16  Yes [name], MD  pravastatin (PRAVACHOL) 40 MG tablet Take 40 mg by mouth daily.  03/29/15  Yes [provider]  SYNTHROID 100 MCG tablet Take 1 tablet (100 mcg total) by mouth daily. 09/20/16  Yes [name], MD  tiotropium (SPIRIVA) 18 MCG inhalation capsule Place 18 mcg into inhaler and inhale daily.   Yes [provider]    Physical Exam: Vitals:   09/23/16 0215 09/23/16 0230 09/23/16 0332 09/23/16 0416  BP: 104/64 (!) 97/57 112/87 (!) 95/56  Pulse: 82 87  86  Resp: 17 13  18   Temp:    98.4 F (36.9 C)  TempSrc:    Oral  SpO2: 94% 93%  94%  Weight:    125.9 kg (277 lb 8 oz)  Height:    5\' 4"  (1.626 m)   General: Not in acute distress HEENT: s/p of thyroidectomy, surgical site is clean.       Eyes: PERRL, EOMI, no scleral icterus.       ENT: No discharge from the ears and nose, no pharynx injection, no tonsillar enlargement.        Neck: No JVD, no bruit, no mass felt. Heme: No neck lymph node enlargement. Cardiac: S1/S2, RRR, No murmurs, No gallops or rubs. Respiratory:  No rales, wheezing, rhonchi or rubs. GI: Soft, nondistended, nontender, no rebound pain, no organomegaly, BS present. GU: No hematuria Ext: 1+ pitting leg edema bilaterally. 2+DP/PT pulse bilaterally. Musculoskeletal: No joint deformities, No joint redness or warmth, no limitation of ROM in spin. Skin: No rashes.  Neuro: Alert, oriented X3, cranial nerves II-XII grossly intact, moves all extremities normally. Psych: Patient is not psychotic, no suicidal or hemocidal ideation.  Labs on Admission: I have personally reviewed following labs and imaging studies  CBC:  Recent Labs Lab 09/22/16 2338  WBC 19.5*  NEUTROABS 15.4*  HGB 9.6*  HCT 31.9*  MCV 85.1  PLT  659   Basic Metabolic Panel:  Recent Labs Lab 09/20/16 0544 09/22/16 2338 09/23/16 0449  NA 135 135  --   K 4.0 3.7  --   CL 92* 97*  --   CO2 27 24  --   GLUCOSE 94 109*  --   BUN 10 26*  --   CREATININE 0.64 1.36*  --   CALCIUM 8.1* 6.3*  --   MG  --  1.4* 1.4*   GFR: Estimated Creatinine Clearance: 60.7 mL/min (A) (by C-G formula based on SCr of 1.36 mg/dL (H)). Liver Function Tests:  Recent Labs Lab 09/22/16 2338  AST 18  ALT 13*  ALKPHOS 66  BILITOT 0.4  PROT 7.0  ALBUMIN 3.4*   No results for input(s): LIPASE, AMYLASE in the last 168 hours. No results for input(s): AMMONIA in the last 168 hours. Coagulation Profile: No results for input(s): INR, PROTIME in the last 168 hours. Cardiac Enzymes:  Recent Labs Lab 09/22/16 2338 09/23/16 0449  TROPONINI 0.03* <0.03   BNP (last 3 results) No results for input(s): PROBNP in the last 8760 hours. HbA1C: No results for input(s): HGBA1C in the last 72 hours. CBG:  Recent Labs Lab 09/19/16 1703 09/19/16 2115 09/20/16 0753 09/23/16 0002 09/23/16 0648  GLUCAP 115* 126* 108* 119* 98   Lipid Profile:  Recent Labs  09/23/16 0449  CHOL 167  HDL 39*  LDLCALC 93  TRIG 176*  CHOLHDL 4.3   Thyroid Function Tests:  Recent Labs  09/22/16 2338 09/23/16 0016  TSH  --  6.713*  FREET4 0.99  --    Anemia Panel: No results for input(s): VITAMINB12, FOLATE, FERRITIN, TIBC, IRON, RETICCTPCT in the last 72 hours. Urine analysis:    Component Value Date/Time   COLORURINE YELLOW 09/23/2016 0316   APPEARANCEUR CLEAR 09/23/2016 0316   LABSPEC 1.012 09/23/2016 0316   PHURINE 5.0 09/23/2016 0316   GLUCOSEU NEGATIVE 09/23/2016 0316   HGBUR NEGATIVE 09/23/2016 0316   BILIRUBINUR NEGATIVE 09/23/2016 0316   KETONESUR NEGATIVE 09/23/2016 0316   PROTEINUR NEGATIVE 09/23/2016 0316   UROBILINOGEN 0.2 02/28/2012 2004   NITRITE NEGATIVE 09/23/2016 0316   LEUKOCYTESUR NEGATIVE 09/23/2016 0316   Sepsis  Labs: @LABRCNTIP (procalcitonin:4,lacticidven:4) )No results found for this or any previous visit (from the past 240 hour(s)).   Radiological Exams on Admission: Dg Chest 2 View  Result Date: 09/23/2016 CLINICAL DATA:  Chest pain EXAM: CHEST  2 VIEW COMPARISON:  08/23/2016, 07/21/2016, 07/20/2016 FINDINGS: Cardiomegaly. Development of bibasilar atelectasis or pneumonia. Possible small effusions. Interim finding of a right convex hilar enlargement. No pneumothorax. Surgical clips at the thoracic inlet. IMPRESSION: 1. Cardiomegaly with development of bibasilar atelectasis or pneumonia 2. Interval finding of convex right hilar enlargement ; uncertain if this is due to enlarged hilar vessels, vascular abnormality, or mass. CT is recommended for further evaluation. 3. Interval postsurgical changes at the thoracic inlet. Electronically Signed   By: Donavan Foil M.D.   On: 09/23/2016 00:53   Ct Chest W Contrast  Result Date: 09/23/2016 CLINICAL DATA:  Acute onset of generalized abdominal cramping and weakness. Left-sided chest pain and intermittent shortness of breath. Recent thyroid surgery. EXAM: CT CHEST WITH CONTRAST TECHNIQUE: Multidetector CT imaging of the chest was performed during intravenous contrast administration. CONTRAST:  75 mL of Isovue 300 IV contrast COMPARISON:  Chest radiograph performed earlier today at 12:24 a.m., and CT of the chest performed 01/23/2016 FINDINGS: Cardiovascular: The heart is borderline enlarged. Minimal calcification is seen at the aortic arch. The great vessels are within normal limits. Mediastinum/Nodes: The mediastinum is otherwise unremarkable in appearance. No mediastinal lymphadenopathy is seen. No pericardial effusion is identified. Prominent soft tissue edema is noted about the thyroid bed, reflecting recent thyroidectomy. No axillary lymphadenopathy is appreciated. Lungs/Pleura: Patchy bibasilar airspace opacities most likely reflect atelectasis, though infection  might conceivably have a similar appearance. No pleural effusion or pneumothorax is seen. An apparent 7 mm nodule is noted at the  medial aspect of the right lung base (image 78 of 144). This is less prominent than in 2017 and may simply reflect atelectasis. Upper Abdomen: The visualized portions of the liver and spleen are grossly unremarkable. The visualized portions of the pancreas, adrenal glands and kidneys are within normal limits. Musculoskeletal: No acute osseous abnormalities are identified. Anterior bridging osteophytes are noted along the lower thoracic spine. The visualized musculature is unremarkable in appearance. IMPRESSION: 1. Patchy bibasilar airspace opacities most likely reflect atelectasis, though infection might have a similar appearance, depending on the patient's symptoms. 2. Borderline cardiomegaly. 3. Prominent soft tissue edema about the thyroid bed, reflecting recent thyroidectomy. Electronically Signed   By: Garald Balding M.D.   On: 09/23/2016 03:34     EKG: Independently reviewed. Sinus rhythm, QTC 483, low voltage, nonspecific T-wave change.   Assessment/Plan Principal Problem:   Hypocalcemia Active Problems:   Controlled type 2 diabetes mellitus without complication, without long-term current use of insulin (HCC)   Benign essential HTN   Chest pain   Chronic combined systolic (congestive) and diastolic (congestive) heart failure (HCC)   HLD (hyperlipidemia)   COPD (chronic obstructive pulmonary disease) (HCC)   Gout   DVT (deep vein thrombosis) in pregnancy ([place])   Thyroid cancer (HCC)   AKI (acute kidney injury) ([place])   Hypocalcemia and hypomagnesemia: Patient's leg cramping is most likely due to hypercalcemia. 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 Mg 1.4  -will admit to tele bed as inpt -IV calcium gluconate, 2 g  -IV magnesium sulfate 2 g  -start calcitriol  -Continue calcium supplement  -f/u by BMP  Chest pain:  Etiology is not clear. Has hx of DVT, and D-dimer is positive1.21. Pulmonary embolism is a potential differential diagnosis. Also need to rule out ACS given positive trop 0.03. Patient has leukocytosis with WBC 19.5, but no fever. Herleukocytosis seem to be chronic issue, less likely to have PNA.  Pt has AKI, cannot do CTA.  - will get V/Q scan and LE doppler - IV heparin - cycle CE q6 x3 and repeat EKG in the am  - Nitroglycerin, Morphine, pravastatin, Coreg - no ASA due to allergy to NSIADs (severe rashes) - Risk factor stratification: will check FLP, and A1C  - 2d echo  DM-II: Last A1c 5.9 on 09/13/16, well controled. Patient is taking metformin at home -SSI  HTN: -hold Lasix and lisinopril due to acute renal injury -IV hydralazine when necessary -Continue Coreg  Chronic combined systolic (congestive) and diastolic (congestive) heart failure ([place]): 2-D echo on 07/17/12 showed EF of 45-50 percent with grade 2 diastolic dysfunction. Patient has 1+ leg edema, but no acute respiratory distress, BNP is 15.4. CHF seems to be compensated. -Hold Lasix due to acute renal injury (patient received 40 mg of Lasix by in ED) -Continue Coreg  HLD: -Pravastatin  COPD (chronic obstructive pulmonary disease) ([place]): stable. No wheezing or rhonchi on auscultation.  -Continue breathing treatment  Gout: -continue home allopurinol and Colchicine  DVT (deep vein thrombosis) in pregnancy [facility]): not on AC -f/u LE dopper  Thyroid cancer [facility]): s/p of thyroidectomy on 4 days ago. TSH is 6.713 - will increase Synthroid dose from 100 to 125 mcg daily -repeat TSH in 4 to 6 weeks  Depression and anxiety: Stable, no suicidal or homicidal ideations. -Continue home medications  AKI: Likely due to prerenal secondary to dehydration and continuation of ACEI diruetics, NSAIDs - IVF: 75 cc/h of NS - Check FeUrea - Follow up renal function by  BMP - hold lasix and lisinopril  DVT ppx: on IV heparin Code  Status: Full code Family Communication: None at bed side.     Disposition Plan:  Anticipate discharge back to previous home environment Consults called:  none Admission status: Inpatient/tele     Date of Service 09/23/2016    Ivor Costa [facility] Pager [phone]  If 7PM-7AM, please contact night-coverage [email or web address] Password [email or web address] 09/23/2016, 7:08 AM

## 2016-09-23 NOTE — ED Notes (Signed)
pts belongings were placed in separate bags per pts request.

## 2016-09-23 NOTE — Progress Notes (Signed)
*  PRELIMINARY RESULTS* Vascular Ultrasound Lower extremity venous duplex has been completed.  Preliminary findings: No evidence of DVT or baker's cyst.    Landry Mellow, RDMS, RVT  09/23/2016, 12:17 PM

## 2016-09-23 NOTE — Progress Notes (Signed)
ANTICOAGULATION CONSULT NOTE - Follow Up Consult  Pharmacy Consult for Heparin Indication: f/u PE  Allergies  Allergen Reactions  . Bee Venom Swelling and Other (See Comments)    "All over my body" (swelling)  . Ibuprofen Rash and Other (See Comments)    Severe rash  . Lamisil [Terbinafine Hcl] Rash and Other (See Comments)    Pt states this causes her to "feel funny"  . Nsaids Other (See Comments)    Per MD's orders     Patient Measurements: Height: 5\' 4"  (162.6 cm) Weight: 277 lb 8 oz (125.9 kg) IBW/kg (Calculated) : 54.7 Heparin Dosing Weight:  86 kg  Vital Signs: Temp: 98.4 F (36.9 C) (06/16 0416) Temp Source: Oral (06/16 0416) BP: 95/56 (06/16 0416) Pulse Rate: 86 (06/16 0416)  Labs:  Recent Labs  09/22/16 2338 09/23/16 0449 09/23/16 0848 09/23/16 1106  HGB 9.6*  --  10.1*  --   HCT 31.9*  --  32.8*  --   PLT 314  --  327  --   HEPARINUNFRC  --   --   --  0.13*  CREATININE 1.36*  --  1.05*  --   TROPONINI 0.03* <0.03 <0.03  --     Estimated Creatinine Clearance: 78.6 mL/min (A) (by C-G formula based on SCr of 1.05 mg/dL (H)).   Assessment:  Anticoag: heparin for r/o PE. No AC PTA. D-dimer 1.21. Hgb 10.1, Plts 327. HL 0.13 low but RN called to say IV had infiltrated. No DVT on dopplers, Low index of suspicion for PE but given elevated d-dimer, follow VQ scan.    Goal of Therapy:  Heparin level 0.3-0.7 units/ml Monitor platelets by anticoagulation protocol: Yes   Plan:  Continue IV heparin at 1450 units/hr Recheck HL in 6 hrs. Daily heparin level and CBC    Kebra Lowrimore S. Alford Highland, PharmD, BCPS Clinical Staff Pharmacist Pager [place], [place] 09/23/2016,1:02 PM

## 2016-09-23 NOTE — Progress Notes (Signed)
Subjective/Chief Complaint: Pt feels well today    Objective: Vital signs in last 24 hours: Temp:  [98.4 F (36.9 C)-99.5 F (37.5 C)] 98.4 F (36.9 C) (06/16 0416) Pulse Rate:  [81-89] 86 (06/16 0416) Resp:  [13-22] 18 (06/16 0416) BP: (90-112)/(56-87) 95/56 (06/16 0416) SpO2:  [93 %-96 %] 93 % (06/16 0911) Weight:  [125.9 kg (277 lb 8 oz)-132.5 kg (292 lb)] 125.9 kg (277 lb 8 oz) (06/16 0416) Last BM Date: 09/22/16  Intake/Output from previous day: 06/15 0701 - 06/16 0700 In: 120 [IV Piggyback:120] Out: 400 [Urine:400] Intake/Output this shift: No intake/output data recorded.  General appearance: alert and cooperative Neck: incision c/d/i, Neg [place]ek's  Lab Results:   Recent Labs  09/22/16 2338  WBC 19.5*  HGB 9.6*  HCT 31.9*  PLT 314   BMET  Recent Labs  09/22/16 2338  NA 135  K 3.7  CL 97*  CO2 24  GLUCOSE 109*  BUN 26*  CREATININE 1.36*  CALCIUM 6.3*   PT/INR No results for input(s): LABPROT, INR in the last 72 hours. ABG No results for input(s): PHART, HCO3 in the last 72 hours.  Invalid input(s): PCO2, PO2  Studies/Results: Dg Chest 2 View  Result Date: 09/23/2016 CLINICAL DATA:  Chest pain EXAM: CHEST  2 VIEW COMPARISON:  08/23/2016, 07/21/2016, 07/20/2016 FINDINGS: Cardiomegaly. Development of bibasilar atelectasis or pneumonia. Possible small effusions. Interim finding of a right convex hilar enlargement. No pneumothorax. Surgical clips at the thoracic inlet. IMPRESSION: 1. Cardiomegaly with development of bibasilar atelectasis or pneumonia 2. Interval finding of convex right hilar enlargement ; uncertain if this is due to enlarged hilar vessels, vascular abnormality, or mass. CT is recommended for further evaluation. 3. Interval postsurgical changes at the thoracic inlet. Electronically Signed   By: Donavan Foil M.D.   On: 09/23/2016 00:53   Ct Chest W Contrast  Result Date: 09/23/2016 CLINICAL DATA:  Acute onset of generalized  abdominal cramping and weakness. Left-sided chest pain and intermittent shortness of breath. Recent thyroid surgery. EXAM: CT CHEST WITH CONTRAST TECHNIQUE: Multidetector CT imaging of the chest was performed during intravenous contrast administration. CONTRAST:  75 mL of Isovue 300 IV contrast COMPARISON:  Chest radiograph performed earlier today at 12:24 a.m., and CT of the chest performed 01/23/2016 FINDINGS: Cardiovascular: The heart is borderline enlarged. Minimal calcification is seen at the aortic arch. The great vessels are within normal limits. Mediastinum/Nodes: The mediastinum is otherwise unremarkable in appearance. No mediastinal lymphadenopathy is seen. No pericardial effusion is identified. Prominent soft tissue edema is noted about the thyroid bed, reflecting recent thyroidectomy. No axillary lymphadenopathy is appreciated. Lungs/Pleura: Patchy bibasilar airspace opacities most likely reflect atelectasis, though infection might conceivably have a similar appearance. No pleural effusion or pneumothorax is seen. An apparent 7 mm nodule is noted at the medial aspect of the right lung base (image 78 of 144). This is less prominent than in 2017 and may simply reflect atelectasis. Upper Abdomen: The visualized portions of the liver and spleen are grossly unremarkable. The visualized portions of the pancreas, adrenal glands and kidneys are within normal limits. Musculoskeletal: No acute osseous abnormalities are identified. Anterior bridging osteophytes are noted along the lower thoracic spine. The visualized musculature is unremarkable in appearance. IMPRESSION: 1. Patchy bibasilar airspace opacities most likely reflect atelectasis, though infection might have a similar appearance, depending on the patient's symptoms. 2. Borderline cardiomegaly. 3. Prominent soft tissue edema about the thyroid bed, reflecting recent thyroidectomy. Electronically Signed   By: Jacqulynn Cadet  Chang M.D.   On: 09/23/2016 03:34      Assessment/Plan: 56 y/o F s/p Total Thyroidectomy. -Ca/Mg suppl  -Incision looks good -Cardiac monitoring -Will follow along  LOS: 0 days    Rosario Jacks., [facility] 09/23/2016

## 2016-09-23 NOTE — ED Notes (Signed)
Pt is upset and has multiple complaints about [facility] she states she does not like it here and wishes she could go to [facility]. Pt is upset that she hears people coughing and she wants a room upstairs now.

## 2016-09-23 NOTE — ED Notes (Signed)
Pt requesting pt socks, provided

## 2016-09-23 NOTE — ED Notes (Signed)
Sent add on label for blood draw.

## 2016-09-23 NOTE — ED Notes (Signed)
Pt requesting anxiety meds, edp made aware

## 2016-09-24 ENCOUNTER — Inpatient Hospital Stay (HOSPITAL_COMMUNITY): Payer: Medicaid Other

## 2016-09-24 DIAGNOSIS — I5042 Chronic combined systolic (congestive) and diastolic (congestive) heart failure: Secondary | ICD-10-CM

## 2016-09-24 DIAGNOSIS — C73 Malignant neoplasm of thyroid gland: Secondary | ICD-10-CM

## 2016-09-24 DIAGNOSIS — I360 Nonrheumatic tricuspid (valve) stenosis: Secondary | ICD-10-CM

## 2016-09-24 DIAGNOSIS — E119 Type 2 diabetes mellitus without complications: Secondary | ICD-10-CM

## 2016-09-24 DIAGNOSIS — N179 Acute kidney failure, unspecified: Secondary | ICD-10-CM

## 2016-09-24 LAB — ECHOCARDIOGRAM COMPLETE
AV Area VTI: 1.91 cm2
AV Mean grad: 7 mmHg
AV Peak grad: 14 mmHg
AV VEL mean LVOT/AV: 0.67
AV area mean vel ind: 0.76 cm2/m2
AV peak Index: 0.77
AV vel: 1.99
AVAREAMEANV: 1.9 cm2
AVAREAVTIIND: 0.8 cm2/m2
AVLVOTPG: 6 mmHg
AVPKVEL: 189 cm/s
Ao pk vel: 0.67 m/s
Area-P 1/2: 2.27 cm2
CHL CUP DOP CALC LVOT VTI: 26.3 cm
CHL CUP LVOT MV VTI INDEX: 0.82 cm2/m2
CHL CUP LVOT MV VTI: 2.04
CHL CUP MV DEC (S): 257
DOP CAL AO MEAN VELOCITY: 122 cm/s
EERAT: 11.46
EWDT: 257 ms
FS: 28 % (ref 28–44)
Height: 64 in
IV/PV OW: 0.82
LA diam index: 1.53 cm/m2
LA vol A4C: 36 ml
LA vol index: 21.9 mL/m2
LA vol: 54.4 mL
LASIZE: 38 mm
LEFT ATRIUM END SYS DIAM: 38 mm
LV E/e'average: 11.46
LV PW d: 15.7 mm — AB (ref 0.6–1.1)
LV TDI E'MEDIAL: 8.16
LVEEMED: 11.46
LVELAT: 8.38 cm/s
LVOT area: 2.84 cm2
LVOT peak vel: 127 cm/s
LVOTD: 19 mm
LVOTSV: 75 mL
LVOTVTI: 0.7 cm
Lateral S' vel: 14.9 cm/s
MV Annulus VTI: 36.6 cm
MV M vel: 74.2
MV Peak grad: 4 mmHg
MV pk A vel: 79.5 m/s
MVPKEVEL: 96 m/s
MVSPHT: 75 ms
Mean grad: 3 mmHg
RV sys press: 47 mmHg
Reg peak vel: 282 cm/s
TAPSE: 33.2 mm
TDI e' lateral: 8.38
TR max vel: 282 cm/s
VTI: 37.6 cm
Valve area index: 0.8
Valve area: 1.99 cm2
Weight: 4536 oz

## 2016-09-24 LAB — CBC
HEMATOCRIT: 32.9 % — AB (ref 36.0–46.0)
Hemoglobin: 10.1 g/dL — ABNORMAL LOW (ref 12.0–15.0)
MCH: 26.3 pg (ref 26.0–34.0)
MCHC: 30.7 g/dL (ref 30.0–36.0)
MCV: 85.7 fL (ref 78.0–100.0)
Platelets: 372 10*3/uL (ref 150–400)
RBC: 3.84 MIL/uL — AB (ref 3.87–5.11)
RDW: 16.2 % — AB (ref 11.5–15.5)
WBC: 11.6 10*3/uL — AB (ref 4.0–10.5)

## 2016-09-24 LAB — HEMOGLOBIN A1C
Hgb A1c MFr Bld: 5.9 % — ABNORMAL HIGH (ref 4.8–5.6)
Mean Plasma Glucose: 123 mg/dL

## 2016-09-24 LAB — BASIC METABOLIC PANEL
Anion gap: 11 (ref 5–15)
BUN: 15 mg/dL (ref 6–20)
CALCIUM: 7.4 mg/dL — AB (ref 8.9–10.3)
CO2: 29 mmol/L (ref 22–32)
Chloride: 98 mmol/L — ABNORMAL LOW (ref 101–111)
Creatinine, Ser: 0.76 mg/dL (ref 0.44–1.00)
Glucose, Bld: 101 mg/dL — ABNORMAL HIGH (ref 65–99)
Potassium: 4.5 mmol/L (ref 3.5–5.1)
Sodium: 138 mmol/L (ref 135–145)

## 2016-09-24 LAB — GLUCOSE, CAPILLARY
GLUCOSE-CAPILLARY: 100 mg/dL — AB (ref 65–99)
Glucose-Capillary: 132 mg/dL — ABNORMAL HIGH (ref 65–99)
Glucose-Capillary: 114 mg/dL — ABNORMAL HIGH (ref 65–99)
Glucose-Capillary: 129 mg/dL — ABNORMAL HIGH (ref 65–99)

## 2016-09-24 LAB — PARATHYROID HORMONE, INTACT (NO CA): PTH: 9 pg/mL — AB (ref 15–65)

## 2016-09-24 LAB — MAGNESIUM: Magnesium: 2 mg/dL (ref 1.7–2.4)

## 2016-09-24 LAB — CALCIUM, IONIZED: Calcium, Ionized, Serum: 3.3 mg/dL — ABNORMAL LOW (ref 4.5–5.6)

## 2016-09-24 LAB — UREA NITROGEN, URINE: UREA NITROGEN UR: 251 mg/dL

## 2016-09-24 MED ORDER — CLONAZEPAM 1 MG PO TABS
1.0000 mg | ORAL_TABLET | Freq: Once | ORAL | Status: AC
  Administered 2016-09-24: 1 mg via ORAL
  Filled 2016-09-24: qty 1

## 2016-09-24 MED ORDER — SODIUM CHLORIDE 0.9 % IV SOLN
1.0000 g | Freq: Once | INTRAVENOUS | Status: AC
  Administered 2016-09-24: 1 g via INTRAVENOUS
  Filled 2016-09-24: qty 10

## 2016-09-24 MED ORDER — PERFLUTREN LIPID MICROSPHERE
1.0000 mL | INTRAVENOUS | Status: AC | PRN
  Filled 2016-09-24: qty 10

## 2016-09-24 NOTE — Progress Notes (Signed)
PT Cancellation Note  Patient Details Name: Michele Owens MRN: [id number] DOB: October 17, 1960   Cancelled Treatment:    Reason Eval/Treat Not Completed: Patient at procedure or test/unavailable Nuc med.   Duncan Dull 09/24/2016, 10:14 AM

## 2016-09-24 NOTE — Plan of Care (Signed)
Problem: Pain Managment: Goal: General experience of comfort will improve Outcome: Progressing Denies pain at this time   

## 2016-09-24 NOTE — Progress Notes (Signed)
PROGRESS NOTE    Michele Owens  NOM:[id number] DOB: July 30, 1960 DOA: 09/22/2016 PCP: [name], MD    Brief Narrative:  57 year old female presented with leg cramps chest pain. Patient known to have hypertension, dyslipidemia, diabetes mellitus type 2, COPD with chronic hypoxic respiratory failure, history of deep vein thrombosis, diastolic heart failure, thyroid cancer status post thyroidectomy 09/18/2016. 24 hours of symptoms, associated with generalized weakness and blurry vision. Substernal chest pain. On initial physical examination blood pressure 104/64, heart rate 82, respiratory rate 17, oxygen saturation 94%. Her lungs were clear to auscultation, heart S1-S2 present rhythmic, her abdomen was soft nontender, 1+ pitting lower extremity edema bilaterally. Sodium 135, potassium 3.7, chloride 97, bicarbonate 24, glucose 109, creatinine 1.36, calcium 6.3, anion gap 14, magnesium 1.4, white count 19.5, hemoglobin 9.6, hematocrit 31.9, platelets 314, d-dimer 1.21, TSH 6.7, free T4 0.9, CT chest with bilateral atelectasis at bases. Chest x-ray hypoinflated, rotated to the right side. EKG sinus rhythm.  Patient admitted with working diagnosis of symptomatic hypocalcemia and hypomagnesemia complicated by acute kidney injury, elevated d-dimer and chest pain. Patient received calcium and magnesium supplementation with improvement of her symptoms, acute kidney injury has resolved. Deep vein thrombosis was rule out.   Assessment & Plan:   Principal Problem:   Hypocalcemia Active Problems:   Controlled type 2 diabetes mellitus without complication, without long-term current use of insulin (HCC)   Benign essential HTN   Chest pain   Chronic combined systolic (congestive) and diastolic (congestive) heart failure (HCC)   HLD (hyperlipidemia)   COPD (chronic obstructive pulmonary disease) (HCC)   Gout   DVT (deep vein thrombosis) in pregnancy ([place])   Thyroid cancer ([place])   AKI (acute kidney  injury) ([place])   1. Hypocalcemia and hypomagnesemia. Improved lower extremity cramps, patient still feeling very weak and deconditioned, will order out of bed with meals and physical therapy evaluation. Ca trending up, will give anther dose of calcium gluconate, will continue with calcitriol and oral calcium. Reported preserved parathyroid glands. Will check PTH. Mag at 2.0 will check phosphate. Corrected calcium for albumin 7,9.    2. T2DM. Will continue glucose cover and monitoring. Patient tolerating po well.  3. Hypothyroid. Continue with levothyroxine. Clinically euthyroid  4. HTN. On carvedilol, and as needed hydralazine  5. Depression. Continue mirtazapine and clonazapem.    DVT prophylaxis: enoxaparin  Code Status: full  Family Communication: No family at bedside  Disposition Plan: Home  Consultants:   Surgery   Procedures:    Antimicrobials:   Subjective: Patient feeling very weak and deconditioned, has not ambulated yet in the hospital. Positive pos-operative pain at the neck. No nausea and vomiting, no further legs cramps.   Objective: Vitals:   09/23/16 2118 09/24/16 0118 09/24/16 0410 09/24/16 0733  BP:   118/63   Pulse:   73   Resp:   18   Temp:   97.5 F (36.4 C)   TempSrc:   Oral   SpO2: 97%  97% 93%  Weight:  128.6 kg (283 lb 8 oz)    Height:        Intake/Output Summary (Last 24 hours) at 09/24/16 0746 Last data filed at 09/24/16 0300  Gross per 24 hour  Intake           643.37 ml  Output             1600 ml  Net          -956.63 ml   Danley Danker  Weights   09/22/16 2337 09/23/16 0416 09/24/16 0118  Weight: 132.5 kg (292 lb) 125.9 kg (277 lb 8 oz) 128.6 kg (283 lb 8 oz)    Examination:  General exam: deconditioned and ill looking appearing E ENT: mild pallor, no icterus, neck surgical wound clean.  Respiratory system: Clear to auscultation. Respiratory effort normal. No wheezing, rales or rhonchi.  Cardiovascular system: S1 & S2 heard, RRR. No  JVD, murmurs, rubs, gallops or clicks. No pedal edema. Gastrointestinal system: Abdomen is nondistended, soft and nontender. No organomegaly or masses felt. Normal bowel sounds heard. Central nervous system: Alert and oriented. No focal neurological deficits. Extremities: Symmetric 5 x 5 power. Skin: No rashes, lesions or ulcers .     Data Reviewed: I have personally reviewed following labs and imaging studies  CBC:  Recent Labs Lab 09/22/16 2338 09/23/16 0848 09/24/16 0524  WBC 19.5* 15.5* 11.6*  NEUTROABS 15.4*  --   --   HGB 9.6* 10.1* 10.1*  HCT 31.9* 32.8* 32.9*  MCV 85.1 84.5 85.7  PLT 314 327 993   Basic Metabolic Panel:  Recent Labs Lab 09/20/16 0544 09/22/16 2338 09/23/16 0449 09/23/16 0848 09/23/16 1557 09/24/16 0524  NA 135 135  --  136 136 138  K 4.0 3.7  --  3.4* 3.0* 4.5  CL 92* 97*  --  93* 95* 98*  CO2 27 24  --  27 28 29   GLUCOSE 94 109*  --  98 150* 101*  BUN 10 26*  --  23* 21* 15  CREATININE 0.64 1.36*  --  1.05* 0.96 0.76  CALCIUM 8.1* 6.3*  --  6.9* 7.1* 7.4*  MG  --  1.4* 1.4*  --  2.6* 2.0   GFR: Estimated Creatinine Clearance: 104.5 mL/min (by C-G formula based on SCr of 0.76 mg/dL). Liver Function Tests:  Recent Labs Lab 09/22/16 2338  AST 18  ALT 13*  ALKPHOS 66  BILITOT 0.4  PROT 7.0  ALBUMIN 3.4*   No results for input(s): LIPASE, AMYLASE in the last 168 hours. No results for input(s): AMMONIA in the last 168 hours. Coagulation Profile: No results for input(s): INR, PROTIME in the last 168 hours. Cardiac Enzymes:  Recent Labs Lab 09/22/16 2338 09/23/16 0449 09/23/16 0848 09/23/16 1557  TROPONINI 0.03* <0.03 <0.03 <0.03   BNP (last 3 results) No results for input(s): PROBNP in the last 8760 hours. HbA1C: No results for input(s): HGBA1C in the last 72 hours. CBG:  Recent Labs Lab 09/23/16 0648 09/23/16 1117 09/23/16 1648 09/23/16 2134 09/24/16 0612  GLUCAP 98 127* 143* 109* 100*   Lipid  Profile:  Recent Labs  09/23/16 0449  CHOL 167  HDL 39*  LDLCALC 93  TRIG 176*  CHOLHDL 4.3   Thyroid Function Tests:  Recent Labs  09/22/16 2338 09/23/16 0016  TSH  --  6.713*  FREET4 0.99  --    Anemia Panel: No results for input(s): VITAMINB12, FOLATE, FERRITIN, TIBC, IRON, RETICCTPCT in the last 72 hours. Sepsis Labs: No results for input(s): PROCALCITON, LATICACIDVEN in the last 168 hours.  No results found for this or any previous visit (from the past 240 hour(s)).       Radiology Studies: Dg Chest 2 View  Result Date: 09/23/2016 CLINICAL DATA:  Chest pain EXAM: CHEST  2 VIEW COMPARISON:  08/23/2016, 07/21/2016, 07/20/2016 FINDINGS: Cardiomegaly. Development of bibasilar atelectasis or pneumonia. Possible small effusions. Interim finding of a right convex hilar enlargement. No pneumothorax. Surgical clips at the thoracic  inlet. IMPRESSION: 1. Cardiomegaly with development of bibasilar atelectasis or pneumonia 2. Interval finding of convex right hilar enlargement ; uncertain if this is due to enlarged hilar vessels, vascular abnormality, or mass. CT is recommended for further evaluation. 3. Interval postsurgical changes at the thoracic inlet. Electronically Signed   By: Donavan Foil M.D.   On: 09/23/2016 00:53   Ct Chest W Contrast  Result Date: 09/23/2016 CLINICAL DATA:  Acute onset of generalized abdominal cramping and weakness. Left-sided chest pain and intermittent shortness of breath. Recent thyroid surgery. EXAM: CT CHEST WITH CONTRAST TECHNIQUE: Multidetector CT imaging of the chest was performed during intravenous contrast administration. CONTRAST:  75 mL of Isovue 300 IV contrast COMPARISON:  Chest radiograph performed earlier today at 12:24 a.m., and CT of the chest performed 01/23/2016 FINDINGS: Cardiovascular: The heart is borderline enlarged. Minimal calcification is seen at the aortic arch. The great vessels are within normal limits. Mediastinum/Nodes: The  mediastinum is otherwise unremarkable in appearance. No mediastinal lymphadenopathy is seen. No pericardial effusion is identified. Prominent soft tissue edema is noted about the thyroid bed, reflecting recent thyroidectomy. No axillary lymphadenopathy is appreciated. Lungs/Pleura: Patchy bibasilar airspace opacities most likely reflect atelectasis, though infection might conceivably have a similar appearance. No pleural effusion or pneumothorax is seen. An apparent 7 mm nodule is noted at the medial aspect of the right lung base (image 78 of 144). This is less prominent than in 2017 and may simply reflect atelectasis. Upper Abdomen: The visualized portions of the liver and spleen are grossly unremarkable. The visualized portions of the pancreas, adrenal glands and kidneys are within normal limits. Musculoskeletal: No acute osseous abnormalities are identified. Anterior bridging osteophytes are noted along the lower thoracic spine. The visualized musculature is unremarkable in appearance. IMPRESSION: 1. Patchy bibasilar airspace opacities most likely reflect atelectasis, though infection might have a similar appearance, depending on the patient's symptoms. 2. Borderline cardiomegaly. 3. Prominent soft tissue edema about the thyroid bed, reflecting recent thyroidectomy. Electronically Signed   By: Garald Balding M.D.   On: 09/23/2016 03:34   Nm Pulmonary Perf And Vent  Result Date: 09/23/2016 CLINICAL DATA:  Shortness of breath and leg cramps. Recent thyroidectomy. EXAM: NUCLEAR MEDICINE VENTILATION - PERFUSION LUNG SCAN TECHNIQUE: Ventilation images were obtained in multiple projections using inhaled aerosol Tc-30m DTPA. Perfusion images were obtained in multiple projections after intravenous injection of Tc-9m MAA. RADIOPHARMACEUTICALS:  31.8 mCi Technetium-93m DTPA aerosol inhalation and 4.2 mCi Technetium-78m MAA IV COMPARISON:  Chest CT 09/23/2016.  Chest radiograph 09/23/2016 FINDINGS: Ventilation: Patchy  uptake of the radiopharmaceutical in the central airways. Perfusion: Patient did not tolerate the procedure well and lateral views could not be obtained. However, perfusion images are essentially normal on the other views. There are no large peripheral perfusion abnormalities. IMPRESSION: Study has technical limitations due to incomplete imaging as described. However, the obtained perfusion images are essentially normal. Very low probability for a pulmonary embolism. Electronically Signed   By: Markus Daft M.D.   On: 09/23/2016 15:50        Scheduled Meds: . allopurinol  100 mg Oral Daily  . calcitRIOL  0.5 mcg Oral Daily  . calcium carbonate  2 tablet Oral TID  . carvedilol  6.25 mg Oral BID WC  . clonazePAM  2 mg Oral BID  . colchicine  0.6 mg Oral BID  . enoxaparin (LOVENOX) injection  0.5 mg/kg Subcutaneous Q24H  . insulin aspart  0-5 Units Subcutaneous QHS  . insulin aspart  0-9  Units Subcutaneous TID WC  . levothyroxine  125 mcg Oral QAC breakfast  . mouth rinse  15 mL Mouth Rinse BID  . mirtazapine  15 mg Oral QHS  . mometasone-formoterol  2 puff Inhalation BID  . nicotine  14 mg Transdermal Daily  . pantoprazole  40 mg Oral Daily  . pravastatin  40 mg Oral q1800  . tiotropium  18 mcg Inhalation Daily   Continuous Infusions:   LOS: 1 day        Jalaila Caradonna Gerome Apley, MD [facility] Pager [phone]  If 7PM-7AM, please contact night-coverage [email or web address] Password [facility] 09/24/2016, 7:46 AM

## 2016-09-24 NOTE — Progress Notes (Signed)
  Echocardiogram 2D Echocardiogram has been performed.  Clanton Emanuelson G Travonte Byard 09/24/2016, 5:00 PM

## 2016-09-25 LAB — GLUCOSE, CAPILLARY
Glucose-Capillary: 108 mg/dL — ABNORMAL HIGH (ref 65–99)
Glucose-Capillary: 167 mg/dL — ABNORMAL HIGH (ref 65–99)

## 2016-09-25 LAB — CBC
HCT: 32.8 % — ABNORMAL LOW (ref 36.0–46.0)
Hemoglobin: 9.8 g/dL — ABNORMAL LOW (ref 12.0–15.0)
MCH: 26 pg (ref 26.0–34.0)
MCHC: 29.9 g/dL — AB (ref 30.0–36.0)
MCV: 87 fL (ref 78.0–100.0)
PLATELETS: 370 10*3/uL (ref 150–400)
RBC: 3.77 MIL/uL — AB (ref 3.87–5.11)
RDW: 16.2 % — ABNORMAL HIGH (ref 11.5–15.5)
WBC: 9.4 10*3/uL (ref 4.0–10.5)

## 2016-09-25 LAB — BASIC METABOLIC PANEL
Anion gap: 11 (ref 5–15)
BUN: 7 mg/dL (ref 6–20)
CHLORIDE: 98 mmol/L — AB (ref 101–111)
CO2: 28 mmol/L (ref 22–32)
Calcium: 7.2 mg/dL — ABNORMAL LOW (ref 8.9–10.3)
Creatinine, Ser: 0.61 mg/dL (ref 0.44–1.00)
GFR calc non Af Amer: >60 mL/min (ref >60)
Glucose, Bld: 101 mg/dL — ABNORMAL HIGH (ref 65–99)
POTASSIUM: 4.2 mmol/L (ref 3.5–5.1)
SODIUM: 137 mmol/L (ref 135–145)

## 2016-09-25 LAB — PHOSPHORUS: Phosphorus: 5.9 mg/dL — ABNORMAL HIGH (ref 2.5–4.6)

## 2016-09-25 MED ORDER — CALCIUM CARBONATE ANTACID 500 MG PO CHEW
2.0000 | CHEWABLE_TABLET | Freq: Four times a day (QID) | ORAL | Status: DC
  Administered 2016-09-25: 400 mg via ORAL

## 2016-09-25 MED ORDER — CALCIUM CARBONATE ANTACID 500 MG PO CHEW
400.0000 mg | CHEWABLE_TABLET | Freq: Once | ORAL | Status: AC
  Administered 2016-09-25: 400 mg via ORAL
  Filled 2016-09-25: qty 2

## 2016-09-25 MED ORDER — ACETAMINOPHEN 325 MG PO TABS: 650.0000 mg | ORAL_TABLET | Freq: Four times a day (QID) | ORAL | Status: DC | PRN

## 2016-09-25 MED ORDER — SODIUM CHLORIDE 0.9 % IV SOLN
2.0000 g | Freq: Once | INTRAVENOUS | Status: AC
  Administered 2016-09-25: 2 g via INTRAVENOUS
  Filled 2016-09-25: qty 20

## 2016-09-25 MED ORDER — CALCIUM CARBONATE ANTACID 500 MG PO CHEW: 4.0000 | CHEWABLE_TABLET | Freq: Four times a day (QID) | ORAL | Status: DC

## 2016-09-25 MED ORDER — CALCIUM CARBONATE ANTACID 500 MG PO CHEW: 4.0000 | CHEWABLE_TABLET | Freq: Four times a day (QID) | ORAL | 1 refills | Status: DC

## 2016-09-25 MED ORDER — CALCITRIOL 0.5 MCG PO CAPS: 0.5000 ug | ORAL_CAPSULE | Freq: Every day | ORAL | 0 refills | Status: DC

## 2016-09-25 MED ORDER — FUROSEMIDE 40 MG PO TABS: 40.0000 mg | ORAL_TABLET | Freq: Every day | ORAL | Status: DC

## 2016-09-25 NOTE — Progress Notes (Signed)
Patient anxious all morning for medications and getting ready for discharge.  Patient stating she was leaving by 1:00 pm no matter what. Pt having "mattress" delivered to her house and she needed to be th MD [place] made aware.  Patient wanting to have transportation arranged for her. Patient lost IV access and new access needed for IV calcium prior to discharge. Patient anxious that one of her two phones was taken.  Patient went through belongings and found. Pt/family given discharge instructions, medication lists, follow up appointments, and when to call the doctor.  Pt/family verbalizes understanding. Pt given signs and symptoms of infection. Patient transported to main entrance for discharge. Payton Emerald, RN

## 2016-09-25 NOTE — Evaluation (Signed)
Physical Therapy Evaluation Patient Details Name: Michele Owens MRN: [id number] DOB: 1960-11-05 Today's Date: 09/25/2016   History of Present Illness  Patient is a 56 y/o female who presents with chest pain and leg cramps. Admitted with symptomatic hypocalcemia and hypomagnesemia complicated by acute kidney injury, elevated d-dimer and chest pain. Chest CT-Patchy bibasilar airspace opacities most likely reflecting atelectasis. PMH includes asthma, COPD, HF, DM, HTN, depression, anxiety, gout, DVT, thyroid ca s/p thyroidectomy 6/11.  Clinical Impression  Patient presents with dyspnea on exertion, decreased endurance, and impaired mobility s/p above. Tolerated gait training  Mod I due to multiple rest breaks. No LOB or difficulty. Pt reports being at functional baseline. Sp02 dropped to 87% on 2L/min 02 but recovered quickly. Pt has support from aide 7 days/week at home and uses [employer] for transportation. Pt does not require skilled therapy services. Discharge from therapy.    Follow Up Recommendations No PT follow up;Supervision - Intermittent    Equipment Recommendations  None recommended by PT    Recommendations for Other Services       Precautions / Restrictions Precautions Precautions: Fall Restrictions Weight Bearing Restrictions: No      Mobility  Bed Mobility               General bed mobility comments: Sitting EOB upon PT arrival.   Transfers Overall transfer level: Independent Equipment used: None             General transfer comment: Stood from EOB without difficulty.   Ambulation/Gait Ambulation/Gait assistance: Modified independent (Device/Increase time) Ambulation Distance (Feet): 400 Feet Assistive device: None Gait Pattern/deviations: Step-through pattern;Decreased stride length;Wide base of support Gait velocity: decreased   General Gait Details: Waddling like gait pattern with 3/4 DOE on 2L.min 02. Sp02 dropped to 87% but recovered quickly.  Multiple standing rest breaks.  Stairs            Wheelchair Mobility    Modified Rankin (Stroke Patients Only)       Balance Overall balance assessment: Needs assistance Sitting-balance support: Feet supported;No upper extremity supported Sitting balance-Leahy Scale: Good     Standing balance support: During functional activity Standing balance-Leahy Scale: Good                               Pertinent Vitals/Pain Pain Assessment: No/denies pain    Home Living Family/patient expects to be discharged to:: Private residence Living Arrangements: Alone Available Help at Discharge: Personal care attendant;Family;Available PRN/intermittently Type of Home: Apartment Home Access: Level entry     Home Layout: One level Home Equipment: Cane - single point;Cane - quad;Walker - 2 wheels;Bedside commode;Walker - 4 wheels;Tub bench      Prior Function Level of Independence: Needs assistance   Gait / Transfers Assistance Needed: Mod indep amb with intermittent use of cane; rides scooter at grocery store. Does not drive.  ADL's / Homemaking Assistance Needed: Aide assists 7 days/week for ~2 hrs/day; helps with cooking, cleaning, and shower occasionally.   Comments: Pt reports she suses [employer] for transfportation and does her own grocery shopping.  She reports she ambulates in grocery store with her cane, or uses scooter if she is fatigued      Hand Dominance   Dominant Hand: Right    Extremity/Trunk Assessment   Upper Extremity Assessment Upper Extremity Assessment: Defer to OT evaluation    Lower Extremity Assessment Lower Extremity Assessment: Overall WFL for tasks assessed (Reports degenerative changes  in knees)       Communication   Communication: No difficulties  Cognition Arousal/Alertness: Awake/alert Behavior During Therapy: WFL for tasks assessed/performed Overall Cognitive Status: Within Functional Limits for tasks assessed                                         General Comments      Exercises     Assessment/Plan    PT Assessment Patent does not need any further PT services  PT Problem List         PT Treatment Interventions      PT Goals (Current goals can be found in the Care Plan section)  Acute Rehab PT Goals Patient Stated Goal: to go home PT Goal Formulation: All assessment and education complete, DC therapy    Frequency     Barriers to discharge        Co-evaluation               AM-PAC PT "6 Clicks" Daily Activity  Outcome Measure Difficulty turning over in bed (including adjusting bedclothes, sheets and blankets)?: None Difficulty moving from lying on back to sitting on the side of the bed? : None Difficulty sitting down on and standing up from a chair with arms (e.g., wheelchair, bedside commode, etc,.)?: None Help needed moving to and from a bed to chair (including a wheelchair)?: None Help needed walking in hospital room?: None Help needed climbing 3-5 steps with a railing? : A Little 6 Click Score: 23    End of Session Equipment Utilized During Treatment: Oxygen Activity Tolerance: Patient tolerated treatment well Patient left: in bed;with call bell/phone within reach;with nursing/sitter in room (sitting EOB) Nurse Communication: Mobility status PT Visit Diagnosis: Unsteadiness on feet (R26.81)    Time: 1610-9604 PT Time Calculation (min) (ACUTE ONLY): 20 min   Charges:   PT Evaluation $PT Eval Low Complexity: 1 Procedure     PT G Codes:        Wray Kearns, PT, DPT [phone]    Marguarite Arbour A Alric Geise 09/25/2016, 10:13 AM

## 2016-09-25 NOTE — Discharge Instructions (Signed)

## 2016-09-25 NOTE — Discharge Summary (Signed)
Physician Discharge Summary  Michele Owens XBW:[id number] DOB: 07/04/1960  PCP: [name], MD  Admit date: 09/22/2016 Discharge date: 09/25/2016  Recommendations for Outpatient Follow-up:  1. Dr. Gala Romney, PCP in 2 days with repeat labs (CBC, CMP, magnesium & phosphorus). I discussed with him on 09/26/16. 2. Recommend follow-up chest x-ray in 4 weeks. 3. Dr. [name], General Surgery   Home Health: None Equipment/Devices: None    Discharge Condition: Improved and stable  CODE STATUS: Full  Diet recommendation: Heart healthy & diabetic diet.  Discharge Diagnoses:  Principal Problem:   Hypocalcemia Active Problems:   Controlled type 2 diabetes mellitus without complication, without long-term current use of insulin (HCC)   Benign essential HTN   Chest pain   Chronic combined systolic (congestive) and diastolic (congestive) heart failure (HCC)   HLD (hyperlipidemia)   COPD (chronic obstructive pulmonary disease) (HCC)   Gout   DVT (deep vein thrombosis) in pregnancy ([place])   Thyroid cancer ([place])   AKI (acute kidney injury) ([place])   Brief Summary: 56 year old female status post total thyroidectomy (for thyroid neoplasm of uncertain behavior) on 09/19/16, 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 Day after she returned home she started developing intermittent bilateral leg cramps mostly when she bent down to do something. Since symptoms persisted, she decided to call EMS. In the ambulance, she developed left-sided chest pain that was reproducible to palpation by EMT, resolved after undetermined amount of time and has not recurred. No dyspnea. Mild intermittent nonproductive cough. Noted to have  hypocalcemia, acute kidney injury, elevated d-dimer, negative BNP and troponin, CT chest showed bibasal atelectasis. Admitted for further evaluation and management.   Assessment & Plan:   1. Symptomatic hypocalcemia: Likely related to recent total thyroidectomy. As per op note 6/12: Parathyroid glands were identified and preserved. Received a dose of IV calcium gluconate prior to last hospital discharge. Was discharged on oral calcium supplements. Presented with calcium of 6.3. She received several IV calcium gluconate doses and continued on oral Tums. Calcium improved to 7.2 on morning of discharge. This was better than on admission but still low. Her symptoms of cramping however resolved. She was given additional calcium gluconate 2 g 1 prior to discharge and oral calcium carbonate was increased to 800 mg of elemental calcium 4 times daily. Calcitriol 0.5 MCG was added. Even though parathyroids were preserved at surgery, intact PTH was low (9 > 13) so unsure if the parathyroids were in some way affected during surgery. Magnesium and potassium was replaced. She was advised to closely follow up with her PCP with labs. Her medications may need adjustment. I personally discussed in detail with her PCP on 6/19 and provided updates. 2. Hypomagnesemia: Magnesium 1.4. This was replaced and corrected. 3. Hypokalemia:  this was replaced. 4. Hyperphosphatemia: Continue Tums. Close outpatient follow-up. 5. Atypical chest pain: Suspect musculoskeletal etiology. Troponin initially 0.03 but subsequent 2 readings negative. EKG 09/23/16 without acute changes. Supportive treatment. Low index of suspicion for PE but given elevated d-dimer, VQ scan was performed which was a limited study but very low probability for pulmonary embolism. Lower extremity venous Dopplers revealed no DVT. Briefly started IV heparin infusion was discontinued. No recurrence of chest pain.  6. Acute kidney injury: Resolved after  IV  fluids. 7. Type II DM:  hemoglobin A1c: 5.9 on 6/16. Metformin was temporarily held in the hospital and is resumed at discharge. Treated in the hospital with SSI. 8. Essential hypertension: Controlled. Continue carvedilol, Lasix and lisinopril. 9. Chronic combined systolic and diastolic CHF: 2-D echo 2/67/12: LVEF 45-50 percent and grade 2 diastolic dysfunction. BNP 15.4. Compensated. Continue home dose of Lasix and lisinopril. Repeat 2-D echo 09/24/16: LVEF 55-60 percent, improved compared to prior study 07/2016. 10. Hyperlipidemia: Pravastatin. 11. COPD: Stable without clinical bronchospasm. 12. Gout: No acute flare. Continue allopurinol and colchicine. 13. Remote history of DVT in pregnancy: No clinical features to suspect DVT. Venous Dopplers negative for DVT. 14. Thyroid cancer, status post total thyroidectomy 6/12: TSH 6.713. Clinically euthyroid. Continue prior home dose of Synthroid until outpatient follow-up regarding new cancer diagnosis and may adjust dose as deemed necessary. Recommend repeating TSH in 4 weeks. Pathology results as below-defer to surgery regarding further management. Recent surgical site without acute findings. Discussed and informed surgical team of patient's admission. I updated patient with pathology results and advised her to follow-up with the surgeons regarding further evaluation and management. I also personally updated patient's PCP on 6/19. 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  15. Depression and anxiety: Stable. 16. Tobacco abuse: Nicotine patch. Cessation counseled. 17. Anemia: Stable.  18. Leukocytosis: Seems chronic, unclear etiology. Some of it may also be due to stress response. No clinical focus of sepsis. Resolved. 19. Basilar atelectasis:  Incentive spirometry.    Consultants:  None   Procedures:  None    Discharge Instructions  Discharge Instructions    (HEART FAILURE PATIENTS) Call MD:  Anytime you have any of the following symptoms: 1) 3 pound weight gain in 24 hours or 5 pounds in 1 week 2) shortness of breath, with or without a dry hacking cough 3) swelling in the hands, feet or stomach 4) if you have to sleep on extra pillows at night in order to breathe.    Complete by:  As directed    Call MD for:    Complete by:  As directed    Recurrent muscle cramps.   Call MD for:  difficulty breathing, headache or visual disturbances    Complete by:  As directed    Call MD for:  extreme fatigue    Complete by:  As directed    Call MD for:  persistant dizziness or light-headedness    Complete by:  As directed    Call MD for:  redness, tenderness, or signs of infection (pain, swelling, redness, odor or green/yellow discharge around incision site)    Complete by:  As directed    Call MD for:  severe uncontrolled pain    Complete by:  As directed    Diet - low sodium heart healthy    Complete by:  As directed    Diet Carb Modified    Complete by:  As directed    Increase activity slowly    Complete by:  As directed        Medication List    STOP taking these medications   TYLENOL/CODEINE #3 300-30 MG tablet Generic drug:  Acetaminophen-Codeine     TAKE these medications   acetaminophen 325 MG tablet Commonly known as:  TYLENOL Take 2 tablets (650  mg total) by mouth every 6 (six) hours as needed for mild pain or headache. Caution: Acetaminophen from all sources not to exceed greater than 3 g per day. What changed:  when to take this  additional instructions   albuterol 1.25 MG/3ML nebulizer solution Commonly known as:  ACCUNEB Take 1 ampule by nebulization 2 (two) times daily.   albuterol 108 (90 Base) MCG/ACT inhaler Commonly known as:  PROVENTIL HFA;VENTOLIN HFA Inhale 1-2 puffs into the lungs  every 6 (six) hours as needed for wheezing.   allopurinol 100 MG tablet Commonly known as:  ZYLOPRIM Take 100 mg by mouth daily.   benzonatate 200 MG capsule Commonly known as:  TESSALON Take 1 capsule (200 mg total) by mouth 3 (three) times daily as needed for cough.   budesonide-formoterol 80-4.5 MCG/ACT inhaler Commonly known as:  SYMBICORT Inhale 2 puffs into the lungs 2 (two) times daily.   calcitRIOL 0.5 MCG capsule Commonly known as:  ROCALTROL Take 1 capsule (0.5 mcg total) by mouth daily. Start taking on:  09/26/2016   calcium carbonate 500 MG chewable tablet Commonly known as:  TUMS Chew 4 tablets (800 mg of elemental calcium total) by mouth 4 (four) times daily. What changed:  how much to take  when to take this   carvedilol 6.25 MG tablet Commonly known as:  COREG TAKE ONE TABLET BY MOUTH TWICE DAILY WITH A MEAL   clonazePAM 2 MG tablet Commonly known as:  KLONOPIN Take 1 tablet (2 mg total) by mouth 2 (two) times daily.   colchicine 0.6 MG tablet Take 0.6 mg by mouth 2 (two) times daily.   diclofenac sodium 1 % Gel Commonly known as:  VOLTAREN Apply 2 g topically 4 (four) times daily as needed (for pain).   dicyclomine 20 MG tablet Commonly known as:  BENTYL Take 1 tablet (20 mg total) by mouth 2 (two) times daily as needed (abdominal pain/cramping).   EPINEPHrine 0.3 mg/0.3 mL Soaj injection Commonly known as:  EPI-PEN Inject 0.3 mg into the muscle daily as needed (allergic reaction).   furosemide 40 MG tablet Commonly known as:  LASIX Take 1 tablet (40 mg total) by mouth daily. What changed:  how much to take  when to take this   HYDROcodone-acetaminophen 5-325 MG tablet Commonly known as:  NORCO/VICODIN Take 1-2 tablets by mouth every 4 (four) hours as needed for moderate pain.   lisinopril 5 MG tablet Commonly known as:  PRINIVIL,ZESTRIL Take 1 tablet (5 mg total) by mouth daily.   loratadine 10 MG tablet Commonly known as:   CLARITIN Take 10 mg by mouth daily as needed for allergies.   metFORMIN 500 MG tablet Commonly known as:  GLUCOPHAGE Take 1 tablet (500 mg total) by mouth 2 (two) times daily with a meal.   mirtazapine 15 MG tablet Commonly known as:  REMERON Take 15 mg by mouth at bedtime.   nicotine 14 mg/24hr patch Commonly known as:  NICODERM CQ - dosed in mg/24 hours Place 1 patch (14 mg total) onto the skin daily.   omeprazole 40 MG capsule Commonly known as:  PRILOSEC Take 40 mg by mouth daily.   potassium chloride SA 20 MEQ tablet Commonly known as:  K-DUR,KLOR-CON Take 2 tablets (40 mEq total) by mouth daily.   pravastatin 40 MG tablet Commonly known as:  PRAVACHOL Take 40 mg by mouth daily.   SYNTHROID 100 MCG tablet Generic drug:  levothyroxine Take 1 tablet (100 mcg total) by mouth daily.  tiotropium 18 MCG inhalation capsule Commonly known as:  SPIRIVA Place 18 mcg into inhaler and inhale daily.      Follow-up Information    [name], MD. Schedule an appointment as soon as possible for a visit in 2 day(s).   Specialty:  Internal Medicine Why:  To be seen with repeat labs (CBC, CMP, Mg & Phosphorus). Contact information: [place]. [place] [address] [phone]        [name], MD. Schedule an appointment as soon as possible for a visit.   Specialty:  General Surgery Contact information: [address] [place] [address] [phone]          Allergies  Allergen Reactions  . Bee Venom Swelling and Other (See Comments)    "All over my body" (swelling)  . Ibuprofen Rash and Other (See Comments)    Severe rash  . Lamisil [Terbinafine Hcl] Rash and Other (See Comments)    Pt states this causes her to "feel funny"  . Nsaids Other (See Comments)    Per MD's orders       Procedures/Studies: Dg Chest 2 View  Result Date: 09/23/2016 CLINICAL DATA:  Chest pain EXAM: CHEST  2 VIEW COMPARISON:  08/23/2016, 07/21/2016,  07/20/2016 FINDINGS: Cardiomegaly. Development of bibasilar atelectasis or pneumonia. Possible small effusions. Interim finding of a right convex hilar enlargement. No pneumothorax. Surgical clips at the thoracic inlet. IMPRESSION: 1. Cardiomegaly with development of bibasilar atelectasis or pneumonia 2. Interval finding of convex right hilar enlargement ; uncertain if this is due to enlarged hilar vessels, vascular abnormality, or mass. CT is recommended for further evaluation. 3. Interval postsurgical changes at the thoracic inlet. Electronically Signed   By: Donavan Foil M.D.   On: 09/23/2016 00:53   Ct Chest W Contrast  Result Date: 09/23/2016 CLINICAL DATA:  Acute onset of generalized abdominal cramping and weakness. Left-sided chest pain and intermittent shortness of breath. Recent thyroid surgery. EXAM: CT CHEST WITH CONTRAST TECHNIQUE: Multidetector CT imaging of the chest was performed during intravenous contrast administration. CONTRAST:  75 mL of Isovue 300 IV contrast COMPARISON:  Chest radiograph performed earlier today at 12:24 a.m., and CT of the chest performed 01/23/2016 FINDINGS: Cardiovascular: The heart is borderline enlarged. Minimal calcification is seen at the aortic arch. The great vessels are within normal limits. Mediastinum/Nodes: The mediastinum is otherwise unremarkable in appearance. No mediastinal lymphadenopathy is seen. No pericardial effusion is identified. Prominent soft tissue edema is noted about the thyroid bed, reflecting recent thyroidectomy. No axillary lymphadenopathy is appreciated. Lungs/Pleura: Patchy bibasilar airspace opacities most likely reflect atelectasis, though infection might conceivably have a similar appearance. No pleural effusion or pneumothorax is seen. An apparent 7 mm nodule is noted at the medial aspect of the right lung base (image 78 of 144). This is less prominent than in 2017 and may simply reflect atelectasis. Upper Abdomen: The visualized  portions of the liver and spleen are grossly unremarkable. The visualized portions of the pancreas, adrenal glands and kidneys are within normal limits. Musculoskeletal: No acute osseous abnormalities are identified. Anterior bridging osteophytes are noted along the lower thoracic spine. The visualized musculature is unremarkable in appearance. IMPRESSION: 1. Patchy bibasilar airspace opacities most likely reflect atelectasis, though infection might have a similar appearance, depending on the patient's symptoms. 2. Borderline cardiomegaly. 3. Prominent soft tissue edema about the thyroid bed, reflecting recent thyroidectomy. Electronically Signed   By: Garald Balding M.D.   On: 09/23/2016 03:34  Nm Pulmonary Perf And Vent  Result Date: 09/23/2016 CLINICAL DATA:  Shortness of breath and leg cramps. Recent thyroidectomy. EXAM: NUCLEAR MEDICINE VENTILATION - PERFUSION LUNG SCAN TECHNIQUE: Ventilation images were obtained in multiple projections using inhaled aerosol Tc-15m DTPA. Perfusion images were obtained in multiple projections after intravenous injection of Tc-55m MAA. RADIOPHARMACEUTICALS:  31.8 mCi Technetium-61m DTPA aerosol inhalation and 4.2 mCi Technetium-28m MAA IV COMPARISON:  Chest CT 09/23/2016.  Chest radiograph 09/23/2016 FINDINGS: Ventilation: Patchy uptake of the radiopharmaceutical in the central airways. Perfusion: Patient did not tolerate the procedure well and lateral views could not be obtained. However, perfusion images are essentially normal on the other views. There are no large peripheral perfusion abnormalities. IMPRESSION: Study has technical limitations due to incomplete imaging as described. However, the obtained perfusion images are essentially normal. Very low probability for a pulmonary embolism. Electronically Signed   By: Markus Daft M.D.   On: 09/23/2016 15:50      Subjective: Patient denies complaints. No leg cramps or chest pain reported. No dizziness, lightheadedness,  blurred vision, dyspnea, palpitations. Anxious and insisting on going home because she has someone coming in to deliver her mattress at 1 PM today. Updated her regarding pathology results. She wanted to know if "all cancer was removed" and I informed her that I was unable to provide her that information and advised her to follow-up with her surgeon regarding further evaluation and management. She verbalized understanding.  Discharge Exam:  Vitals:   09/24/16 1935 09/24/16 2107 09/25/16 0413 09/25/16 1040  BP: 113/68  111/70   Pulse: 75  71   Resp: 18  18   Temp: 97.7 F (36.5 C)  98 F (36.7 C)   TempSrc: Oral  Oral   SpO2: 98% 97% 96% 96%  Weight:      Height:        General exam: Pleasant middle-aged female, moderately built and obese, sitting up comfortably in bed. Neck: Postop thyroidectomy site without acute findings. Respiratory system: clear to auscultation. Respiratory effort normal. Cardiovascular system: S1 & S2 heard, RRR. No JVD, murmurs, rubs, gallops or clicks. Trace ankle edema. Gastrointestinal system: Abdomen is nondistended, soft and nontender. No organomegaly or masses felt. Normal bowel sounds heard. Central nervous system: Alert and oriented. No focal neurological deficits. Extremities: Symmetric 5 x 5 power. Skin: No rashes, lesions or ulcers Psychiatry: Judgement and insight appear normal. Mood & affect appropriate.     The results of significant diagnostics from this hospitalization (including imaging, microbiology, ancillary and laboratory) are listed below for reference.       Labs: CBC:  Recent Labs Lab 09/22/16 2338 09/23/16 0848 09/24/16 0524 09/25/16 0414  WBC 19.5* 15.5* 11.6* 9.4  NEUTROABS 15.4*  --   --   --   HGB 9.6* 10.1* 10.1* 9.8*  HCT 31.9* 32.8* 32.9* 32.8*  MCV 85.1 84.5 85.7 87.0  PLT 314 327 372 563   Basic Metabolic Panel:  Recent Labs Lab 09/22/16 2338 09/23/16 0449 09/23/16 0848 09/23/16 1557 09/24/16 0524  09/25/16 0414  NA 135  --  136 136 138 137  K 3.7  --  3.4* 3.0* 4.5 4.2  CL 97*  --  93* 95* 98* 98*  CO2 24  --  27 28 29 28   GLUCOSE 109*  --  98 150* 101* 101*  BUN 26*  --  23* 21* 15 7  CREATININE 1.36*  --  1.05* 0.96 0.76 0.61  CALCIUM 6.3*  --  6.9* 7.1* 7.4*  7.2*  MG 1.4* 1.4*  --  2.6* 2.0  --   PHOS  --   --   --   --   --  5.9*   Liver Function Tests:  Recent Labs Lab 09/22/16 2338  AST 18  ALT 13*  ALKPHOS 66  BILITOT 0.4  PROT 7.0  ALBUMIN 3.4*   BNP (last 3 results)  Recent Labs  07/17/16 0252 08/01/16 1536 09/22/16 2358  BNP 41.4 9.7 15.4   Cardiac Enzymes:  Recent Labs Lab 09/22/16 2338 09/23/16 0449 09/23/16 0848 09/23/16 1557  TROPONINI 0.03* <0.03 <0.03 <0.03   CBG:  Recent Labs Lab 09/24/16 1118 09/24/16 1649 09/24/16 2058 09/25/16 0608 09/25/16 1137  GLUCAP 132* 114* 129* 108* 167*   Hgb A1c  Recent Labs  09/23/16 0449  HGBA1C 5.9*   Lipid Profile  Recent Labs  09/23/16 0449  CHOL 167  HDL 39*  LDLCALC 93  TRIG 176*  CHOLHDL 4.3   Thyroid function studies  Recent Labs  09/23/16 0016  TSH 6.713*   Urinalysis    Component Value Date/Time   COLORURINE YELLOW 09/23/2016 0316   APPEARANCEUR CLEAR 09/23/2016 0316   LABSPEC 1.012 09/23/2016 0316   PHURINE 5.0 09/23/2016 0316   GLUCOSEU NEGATIVE 09/23/2016 0316   HGBUR NEGATIVE 09/23/2016 0316   BILIRUBINUR NEGATIVE 09/23/2016 0316   KETONESUR NEGATIVE 09/23/2016 0316   PROTEINUR NEGATIVE 09/23/2016 0316   UROBILINOGEN 0.2 02/28/2012 2004   NITRITE NEGATIVE 09/23/2016 0316   LEUKOCYTESUR NEGATIVE 09/23/2016 0316      Time coordinating discharge: Over 30 minutes  SIGNED:  Vernell Leep, MD, FACP, [place]. [facility] Pager [phone]  If 7PM-7AM, please contact night-coverage [email or web address] Password [email or web address] 09/25/2016, 11:50 AM

## 2016-09-26 LAB — PTH, INTACT AND CALCIUM
Calcium, Total (PTH): 7.2 mg/dL — ABNORMAL LOW (ref 8.7–10.2)
PTH: 13 pg/mL — ABNORMAL LOW (ref 15–65)

## 2016-10-02 ENCOUNTER — Inpatient Hospital Stay (HOSPITAL_COMMUNITY)
Admission: EM | Admit: 2016-10-02 | Discharge: 2016-10-04 | DRG: 641 | Disposition: A | Discharge status: Home Health | Payer: Medicaid Other | Attending: Family Medicine | Admitting: Family Medicine

## 2016-10-02 ENCOUNTER — Ambulatory Visit (HOSPITAL_COMMUNITY)
Admission: EM | Admit: 2016-10-02 | Discharge: 2016-10-02 | Disposition: A | Discharge status: Home / Self Care | Payer: Medicaid Other | Source: Home / Self Care

## 2016-10-02 ENCOUNTER — Encounter (HOSPITAL_COMMUNITY): Payer: Self-pay

## 2016-10-02 ENCOUNTER — Emergency Department (HOSPITAL_COMMUNITY): Payer: Medicaid Other

## 2016-10-02 DIAGNOSIS — K219 Gastro-esophageal reflux disease without esophagitis: Secondary | ICD-10-CM | POA: Diagnosis present

## 2016-10-02 DIAGNOSIS — J9811 Atelectasis: Secondary | ICD-10-CM | POA: Diagnosis present

## 2016-10-02 DIAGNOSIS — I110 Hypertensive heart disease with heart failure: Secondary | ICD-10-CM | POA: Diagnosis present

## 2016-10-02 DIAGNOSIS — R0789 Other chest pain: Secondary | ICD-10-CM

## 2016-10-02 DIAGNOSIS — Z886 Allergy status to analgesic agent status: Secondary | ICD-10-CM

## 2016-10-02 DIAGNOSIS — Z87891 Personal history of nicotine dependence: Secondary | ICD-10-CM

## 2016-10-02 DIAGNOSIS — Z888 Allergy status to other drugs, medicaments and biological substances status: Secondary | ICD-10-CM

## 2016-10-02 DIAGNOSIS — M109 Gout, unspecified: Secondary | ICD-10-CM | POA: Diagnosis present

## 2016-10-02 DIAGNOSIS — F1290 Cannabis use, unspecified, uncomplicated: Secondary | ICD-10-CM | POA: Diagnosis present

## 2016-10-02 DIAGNOSIS — Z7951 Long term (current) use of inhaled steroids: Secondary | ICD-10-CM

## 2016-10-02 DIAGNOSIS — J449 Chronic obstructive pulmonary disease, unspecified: Secondary | ICD-10-CM | POA: Diagnosis present

## 2016-10-02 DIAGNOSIS — R609 Edema, unspecified: Secondary | ICD-10-CM

## 2016-10-02 DIAGNOSIS — Z7984 Long term (current) use of oral hypoglycemic drugs: Secondary | ICD-10-CM

## 2016-10-02 DIAGNOSIS — N611 Abscess of the breast and nipple: Secondary | ICD-10-CM | POA: Diagnosis present

## 2016-10-02 DIAGNOSIS — I5042 Chronic combined systolic (congestive) and diastolic (congestive) heart failure: Secondary | ICD-10-CM | POA: Diagnosis present

## 2016-10-02 DIAGNOSIS — F329 Major depressive disorder, single episode, unspecified: Secondary | ICD-10-CM | POA: Diagnosis present

## 2016-10-02 DIAGNOSIS — Z79899 Other long term (current) drug therapy: Secondary | ICD-10-CM

## 2016-10-02 DIAGNOSIS — E1169 Type 2 diabetes mellitus with other specified complication: Secondary | ICD-10-CM | POA: Diagnosis present

## 2016-10-02 DIAGNOSIS — Z79891 Long term (current) use of opiate analgesic: Secondary | ICD-10-CM

## 2016-10-02 DIAGNOSIS — E119 Type 2 diabetes mellitus without complications: Secondary | ICD-10-CM | POA: Diagnosis present

## 2016-10-02 DIAGNOSIS — R079 Chest pain, unspecified: Secondary | ICD-10-CM | POA: Diagnosis present

## 2016-10-02 DIAGNOSIS — R252 Cramp and spasm: Secondary | ICD-10-CM

## 2016-10-02 DIAGNOSIS — E669 Obesity, unspecified: Secondary | ICD-10-CM | POA: Diagnosis present

## 2016-10-02 DIAGNOSIS — Z85850 Personal history of malignant neoplasm of thyroid: Secondary | ICD-10-CM

## 2016-10-02 DIAGNOSIS — R0609 Other forms of dyspnea: Secondary | ICD-10-CM

## 2016-10-02 DIAGNOSIS — Z9981 Dependence on supplemental oxygen: Secondary | ICD-10-CM

## 2016-10-02 DIAGNOSIS — E785 Hyperlipidemia, unspecified: Secondary | ICD-10-CM | POA: Diagnosis present

## 2016-10-02 DIAGNOSIS — Z86718 Personal history of other venous thrombosis and embolism: Secondary | ICD-10-CM

## 2016-10-02 DIAGNOSIS — T503X6A Underdosing of electrolytic, caloric and water-balance agents, initial encounter: Secondary | ICD-10-CM | POA: Diagnosis present

## 2016-10-02 DIAGNOSIS — Z91138 Patient's unintentional underdosing of medication regimen for other reason: Secondary | ICD-10-CM

## 2016-10-02 DIAGNOSIS — E890 Postprocedural hypothyroidism: Secondary | ICD-10-CM | POA: Diagnosis present

## 2016-10-02 DIAGNOSIS — Z720 Tobacco use: Secondary | ICD-10-CM | POA: Diagnosis present

## 2016-10-02 DIAGNOSIS — Z6841 Body Mass Index (BMI) 40.0 and over, adult: Secondary | ICD-10-CM

## 2016-10-02 DIAGNOSIS — E209 Hypoparathyroidism, unspecified: Secondary | ICD-10-CM | POA: Diagnosis present

## 2016-10-02 DIAGNOSIS — F419 Anxiety disorder, unspecified: Secondary | ICD-10-CM | POA: Diagnosis present

## 2016-10-02 DIAGNOSIS — Z91030 Bee allergy status: Secondary | ICD-10-CM

## 2016-10-02 DIAGNOSIS — Y92009 Unspecified place in unspecified non-institutional (private) residence as the place of occurrence of the external cause: Secondary | ICD-10-CM

## 2016-10-02 LAB — BASIC METABOLIC PANEL WITH GFR
Anion gap: 10 (ref 5–15)
BUN: <5 mg/dL — ABNORMAL LOW (ref 6–20)
CO2: 30 mmol/L (ref 22–32)
Calcium: 6.7 mg/dL — ABNORMAL LOW (ref 8.9–10.3)
Chloride: 101 mmol/L (ref 101–111)
Creatinine, Ser: 0.69 mg/dL (ref 0.44–1.00)
GFR calc Af Amer: >60 mL/min
GFR calc non Af Amer: >60 mL/min
Glucose, Bld: 121 mg/dL — ABNORMAL HIGH (ref 65–99)
Potassium: 3.7 mmol/L (ref 3.5–5.1)
Sodium: 141 mmol/L (ref 135–145)

## 2016-10-02 LAB — CBC
HCT: 35.4 % — ABNORMAL LOW (ref 36.0–46.0)
HEMOGLOBIN: 10.5 g/dL — AB (ref 12.0–15.0)
MCH: 26 pg (ref 26.0–34.0)
MCHC: 29.7 g/dL — AB (ref 30.0–36.0)
MCV: 87.6 fL (ref 78.0–100.0)
Platelets: 395 10*3/uL (ref 150–400)
RBC: 4.04 MIL/uL (ref 3.87–5.11)
RDW: 16.3 % — ABNORMAL HIGH (ref 11.5–15.5)
WBC: 14.5 10*3/uL — ABNORMAL HIGH (ref 4.0–10.5)

## 2016-10-02 LAB — MAGNESIUM: Magnesium: 1.5 mg/dL — ABNORMAL LOW (ref 1.7–2.4)

## 2016-10-02 LAB — BRAIN NATRIURETIC PEPTIDE: B Natriuretic Peptide: 31.3 pg/mL (ref 0.0–100.0)

## 2016-10-02 LAB — PHOSPHORUS: Phosphorus: 4.7 mg/dL — ABNORMAL HIGH (ref 2.5–4.6)

## 2016-10-02 MED ORDER — FUROSEMIDE 10 MG/ML IJ SOLN
40.0000 mg | Freq: Once | INTRAMUSCULAR | Status: AC
  Administered 2016-10-03: 40 mg via INTRAVENOUS
  Filled 2016-10-02: qty 4

## 2016-10-02 MED ORDER — MAGNESIUM SULFATE 2 GM/50ML IV SOLN
2.0000 g | Freq: Once | INTRAVENOUS | Status: AC
Start: 2016-10-03 — End: 2016-10-03
  Administered 2016-10-03: 2 g via INTRAVENOUS
  Filled 2016-10-02: qty 50

## 2016-10-02 MED ORDER — SODIUM CHLORIDE 0.9 % IV SOLN
1.0000 g | Freq: Once | INTRAVENOUS | Status: AC
  Administered 2016-10-03: 1 g via INTRAVENOUS
  Filled 2016-10-02: qty 10

## 2016-10-02 MED ORDER — HYDROCODONE-ACETAMINOPHEN 5-325 MG PO TABS
2.0000 | ORAL_TABLET | Freq: Once | ORAL | Status: AC
Start: 2016-10-02 — End: 2016-10-02
  Administered 2016-10-02: 2 via ORAL
  Filled 2016-10-02: qty 2

## 2016-10-02 NOTE — ED Triage Notes (Signed)
Pt states she has chest pain and shortness of breath that began today. She reports hx of heart failure. Swelling noted to feet. Pt denies radiation of chest pain. No distress noted.

## 2016-10-02 NOTE — ED Notes (Signed)
Pt requesting chronic pain medication. Informed pt a provider would need to order her chronic pain medication. Pt expressed understanding. Had a recent thyroidectomy, incision site appears to be healing appropriately. Pt c/o chest pain and sob with bilateral extremity edema. Reports being compliant with lasix at home. Pt provided sandwich, multiple packs of graham crackers, Diet Ginger Ale, Diet Coke, and a pitcher of water per request.

## 2016-10-02 NOTE — ED Notes (Signed)
Patient not seen by this nurse.  Patient was assessed by Janne Napoleon, NP and patient decided to go to ed

## 2016-10-02 NOTE — ED Provider Notes (Signed)
[facility] DEPT Provider Note   CSN: [id number] Arrival date & time: 10/02/16  1523     History   Chief Complaint Chief Complaint  Patient presents with  . Chest Pain    HPI Michele Owens is a 56 y.o. 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  Hx of HTN, HLD, DM 2, COPD on home oxygen 2 L/m, GERD, hypothyroid, gout, remote DVT, anxiety and depression, chronic systolic CHF (EF 47-42 percent), obesity, tobacco abuse.  Pt presents to the emergency department today with leg cramp, Bilateral lower extremity edema and left-sided chest pain with associated dyspnea on exertion onset 2 days ago. Patient reports compliance with her medications including her Lasix without relief. She reports taking her calcium supplements as well. Exertion makes her shortness of breath worse. Nothing seems to make her chest pain better or worse. She denies fevers or chills, URI symptoms, nausea, vomiting, diaphoresis, weakness, dizziness, syncope.  Patient continues to smoke.  Record review shows that at time of last admission, approximately 10 days ago patient had an elevated d-dimer but negative VQ scan and CT chest. Her BMP was negative that day as was her troponin.  Her calcium, potassium and magnesium were repleted and her PTH was found to be low.  She had a DVT study which was negative.  2-D echo 07/27/12: LVEF 45-50 percent and grade 2 diastolic dysfunction. Repeat 2-D echo 09/24/16: LVEF 55-60 percent, improved compared to prior study 07/2016.  Filed Weights   10/02/16 1614  Weight: 128.4 kg (283 lb)     Wt Readings from Last 3 Encounters:  08/01/16 273 lb 6 oz (124 kg)  07/24/16 270 lb 4.8 oz (122.6 kg)  02/10/16 287 lb 12.8 oz (130.5 kg)    The history is provided by  the patient and medical records. No language interpreter was used.    Past Medical History:  Diagnosis Date  . Anxiety   . Arthritis   . Asthma   . Chronic diastolic CHF (congestive heart failure) ([place])   . COPD (chronic obstructive pulmonary disease) (HCC)    Uses Oxygen at night  . Depression   . Diabetes mellitus without complication ([place])   . Dyspnea    occ  . GERD (gastroesophageal reflux disease)   . Gout   . Headache    migraines  . History of DVT of lower extremity   . History of nuclear stress test    Myoview 2/17:  Low risk stress nuclear study with a small, moderate intensity, partially reversible inferior lateral defect consistent with small prior infarct and minimal peri-infarct ischemia; EF 68 with normal wall motion  . Hypertension   . Pneumonia   . Thyroid cancer ([place]) 09/23/2016    Patient Active Problem List   Diagnosis Date Noted  . Hypomagnesemia 10/03/2016  . HLD (hyperlipidemia) 09/23/2016  . COPD (chronic obstructive pulmonary disease) ([place]) 09/23/2016  . Gout 09/23/2016  . DVT (deep vein thrombosis) in pregnancy ([place]) 09/23/2016  . Thyroid cancer ([place]) 09/23/2016  . Hypocalcemia 09/23/2016  . AKI (acute kidney injury) ([place]) 09/23/2016  . 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 Acute pulmonary edema (HCC)   . Acute hypoxemic respiratory failure ([place])   . Acute on chronic respiratory failure with hypoxemia ([place])   . Ventilator dependent ([place])   .  Respiratory failure ([place]) 07/16/2016  . Chronic combined systolic (congestive) and diastolic (congestive) heart failure ([place]) 02/10/2016  . Dyspnea   . CAP (community acquired pneumonia) 01/23/2016  . 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 Pulmonary hypertension ([place]) 01/23/2016  . Sepsis ([place]) 01/23/2016  . Obesity hypoventilation syndrome ([place]) 08/26/2015  . Chest pain 06/16/2015  . Dyslipidemia associated with type 2 diabetes mellitus ([place]) 04/24/2015   . Leukocytosis 04/24/2015  . Controlled type 2 diabetes mellitus without complication, without long-term current use of insulin ([place]) 04/24/2015  . Anxiety 04/24/2015  . Benign essential HTN 04/24/2015  . Normocytic anemia 04/24/2015  . Asthma with COPD with exacerbation ([place]) 04/24/2015  . OSA (obstructive sleep apnea) 05/10/2012  . Tobacco abuse 07/04/2011    Past Surgical History:  Procedure Laterality Date  . BUNIONECTOMY Bilateral   . CARDIAC CATHETERIZATION N/A 06/23/2015   Procedure: Right/Left Heart Cath and Coronary Angiography;  Surgeon: [name], MD;  Location: [facility];  Service: Cardiovascular;  Laterality: N/A;  . COLONOSCOPY WITH PROPOFOL N/A 12/15/2015   Procedure: COLONOSCOPY WITH PROPOFOL;  Surgeon: [name], MD;  Location: [facility];  Service: Endoscopy;  Laterality: N/A;  . THYROIDECTOMY  09/19/2016  . THYROIDECTOMY N/A 09/19/2016   Procedure: TOTAL THYROIDECTOMY;  Surgeon: [name], MD;  Location: [facility];  Service: General;  Laterality: N/A;  . TONSILLECTOMY    . TOTAL ABDOMINAL HYSTERECTOMY  07/14/10    OB History    No data available       Home Medications    Prior to Admission medications   Medication Sig Start Date End Date Taking? Authorizing Provider  acetaminophen (TYLENOL) 325 MG tablet Take 2 tablets (650 mg total) by mouth every 6 (six) hours as needed for mild pain or headache. Caution: Acetaminophen from all sources not to exceed greater than 3 g per day. 09/25/16  Yes Hongalgi, [name], MD  albuterol (ACCUNEB) 1.25 MG/3ML nebulizer solution Take 1 ampule by nebulization 2 (two) times daily.   Yes [provider]  albuterol (PROVENTIL HFA;VENTOLIN HFA) 108 (90 BASE) MCG/ACT inhaler Inhale 1-2 puffs into the lungs every 6 (six) hours as needed for wheezing. 04/09/12  Yes Kindl, Nelda Severe, MD  mometasone-formoterol (DULERA) 100-5 MCG/ACT AERO Inhale 2 puffs into the lungs 2 (two) times daily.   Yes [provider]   nicotine (NICODERM CQ - DOSED IN MG/24 HOURS) 14 mg/24hr patch Place 1 patch (14 mg total) onto the skin daily. 07/25/16  Yes Lavina Hamman, MD  omeprazole (PRILOSEC) 40 MG capsule Take 40 mg by mouth daily. 04/13/15  Yes [provider]  tiotropium (SPIRIVA) 18 MCG inhalation capsule Place 18 mcg into inhaler and inhale daily.   Yes [provider]  allopurinol (ZYLOPRIM) 100 MG tablet Take 100 mg by mouth daily.    [provider]  benzonatate (TESSALON) 200 MG capsule Take 1 capsule (200 mg total) by mouth 3 (three) times daily as needed for cough. 07/24/16   Lavina Hamman, MD  budesonide-formoterol [facility]) 80-4.5 MCG/ACT inhaler Inhale 2 puffs into the lungs 2 (two) times daily. 07/24/16   Lavina Hamman, MD  calcitRIOL (ROCALTROL) 0.5 MCG capsule Take 1 capsule (0.5 mcg total) by mouth daily. 09/26/16   Hongalgi, [name], MD  calcium carbonate (TUMS) 500 MG chewable tablet Chew 4 tablets (800 mg of elemental calcium total) by mouth 4 (four) times daily. 09/25/16   Hongalgi, [name], MD  carvedilol (COREG)  6.25 MG tablet TAKE ONE TABLET BY MOUTH TWICE DAILY WITH A MEAL 08/22/16   Bensimhon, [name], MD  clonazePAM (KLONOPIN) 2 MG tablet Take 1 tablet (2 mg total) by mouth 2 (two) times daily. 01/27/16   Mikhail, Velta Addison, DO  colchicine 0.6 MG tablet Take 0.6 mg by mouth 2 (two) times daily.    [provider]  diclofenac sodium (VOLTAREN) 1 % GEL Apply 2 g topically 4 (four) times daily as needed (for pain).     [provider]  dicyclomine (BENTYL) 20 MG tablet Take 1 tablet (20 mg total) by mouth 2 (two) times daily as needed (abdominal pain/cramping). 11/28/15   Antonietta Breach, PA-C  EPINEPHrine 0.3 mg/0.3 mL IJ SOAJ injection Inject 0.3 mg into the muscle daily as needed (allergic reaction).    [provider]  furosemide (LASIX) 40 MG tablet Take 1 tablet (40 mg total) by mouth daily. 09/25/16   Hongalgi, [name], MD    HYDROcodone-acetaminophen (NORCO/VICODIN) 5-325 MG tablet Take 1-2 tablets by mouth every 4 (four) hours as needed for moderate pain. 09/20/16   [name], MD  lisinopril (PRINIVIL,ZESTRIL) 5 MG tablet Take 1 tablet (5 mg total) by mouth daily. 08/01/16   [name], MD  loratadine (CLARITIN) 10 MG tablet Take 10 mg by mouth daily as needed for allergies.     [provider]  metFORMIN (GLUCOPHAGE) 500 MG tablet Take 1 tablet (500 mg total) by mouth 2 (two) times daily with a meal. 12/11/14   [name], MD  mirtazapine (REMERON) 15 MG tablet Take 15 mg by mouth at bedtime. 11/16/14   [provider]  potassium chloride SA (K-DUR,KLOR-CON) 20 MEQ tablet Take 2 tablets (40 mEq total) by mouth daily. 08/01/16   [name], MD  pravastatin (PRAVACHOL) 40 MG tablet Take 40 mg by mouth daily.  03/29/15   [provider]  SYNTHROID 100 MCG tablet Take 1 tablet (100 mcg total) by mouth daily. 09/20/16   [name], MD    Family History Family History  Problem Relation Age of Onset  . Cancer Father   . Diabetes Mother     Social History Social History  Substance Use Topics  . Smoking status: Former Smoker    Packs/day: 0.50    Years: 41.00    Types: Cigarettes    Quit date: 07/2016  . Smokeless tobacco: Never Used     Comment: Starting to [employer] -- using Nicotine Patch  . Alcohol use 0.0 oz/week     Comment: ? none now     Allergies   Bee venom; Ibuprofen; Lamisil [terbinafine hcl]; and Nsaids   Review of Systems Review of Systems  Constitutional: Positive for activity change. Negative for appetite change, diaphoresis, fatigue, fever and unexpected weight change.  HENT: Negative for mouth sores.   Eyes: Negative for visual disturbance.  Respiratory: Positive for chest tightness and shortness of breath. Negative for cough and wheezing.   Cardiovascular: Positive for chest pain and leg swelling.  Gastrointestinal: Negative for  abdominal pain, constipation, diarrhea, nausea and vomiting.  Endocrine: Negative for polydipsia, polyphagia and polyuria.  Genitourinary: Negative for dysuria, frequency, hematuria and urgency.  Musculoskeletal: Positive for myalgias ( muscle cramps). Negative for back pain and neck stiffness.  Skin: Negative for rash.  Allergic/Immunologic: Negative for immunocompromised state.  Neurological: Negative for syncope, light-headedness and headaches.  Hematological: Does not bruise/bleed easily.  Psychiatric/Behavioral: Negative for sleep disturbance. The patient is not nervous/anxious.  All other systems reviewed and are negative.    Physical Exam Updated Vital Signs BP (!) 112/48 (BP Location: Right Arm)   Pulse 79   Temp 98.5 F (36.9 C) (Oral)   Resp 18   Ht 5\' 4"  (1.626 m)   Wt 128.4 kg (283 lb)   SpO2 99%   BMI 48.58 kg/m   Physical Exam  Constitutional: She appears well-developed and well-nourished. No distress.  Awake, alert, nontoxic appearance Obese  HENT:  Head: Normocephalic and atraumatic.  Mouth/Throat: Oropharynx is clear and moist. No oropharyngeal exudate.  Eyes: Conjunctivae are normal. No scleral icterus.  Neck: Normal range of motion. Neck supple.  Cardiovascular: Normal rate, regular rhythm and intact distal pulses.   Pulmonary/Chest: Effort normal. No respiratory distress. She has decreased breath sounds (throughout). She has no wheezes.  Equal chest expansion  Abdominal: Soft. Bowel sounds are normal. She exhibits no mass. There is no tenderness. There is no rebound and no guarding.  Musculoskeletal: Normal range of motion. She exhibits edema ( 2+ pitting bilaterally).  Neurological: She is alert.  Speech is clear and goal oriented Moves extremities without ataxia  Skin: Skin is warm and dry. She is not diaphoretic.  Area of erythema and induration to the left medial breast with tenderness to palpation and mild fluctuance  Psychiatric: Her mood  appears anxious.  Patient is anxious and tearful  Nursing note and vitals reviewed.    ED Treatments / Results  Labs (all labs ordered are listed, but only abnormal results are displayed) Labs Reviewed  BASIC METABOLIC PANEL - Abnormal; Notable for the following:       Result Value   Glucose, Bld 121 (*)    BUN <5 (*)    Calcium 6.7 (*)    All other components within normal limits  CBC - Abnormal; Notable for the following:    WBC 14.5 (*)    Hemoglobin 10.5 (*)    HCT 35.4 (*)    MCHC 29.7 (*)    RDW 16.3 (*)    All other components within normal limits  MAGNESIUM - Abnormal; Notable for the following:    Magnesium 1.5 (*)    All other components within normal limits  PHOSPHORUS - Abnormal; Notable for the following:    Phosphorus 4.7 (*)    All other components within normal limits  BRAIN NATRIURETIC PEPTIDE  I-STAT TROPOININ, ED    EKG  EKG Interpretation  Date/Time:  Monday October 02 2016 16:05:52 EDT Ventricular Rate:  77 PR Interval:  168 QRS Duration: 74 QT Interval:  414 QTC Calculation: 468 R Axis:   54 Text Interpretation:  Normal sinus rhythm Nonspecific T wave abnormality No significant change since last tracing Abnormal ekg Confirmed by Carmin Muskrat [phone]) on 10/03/2016 12:33:27 AM       Radiology Dg Chest 2 View  Result Date: 10/02/2016 CLINICAL DATA:  56 year old female with shortness of breath. Hypertension, diabetes, COPD. Initial encounter. EXAM: CHEST  2 VIEW COMPARISON:  09/23/2016 CT and 09/23/2016 chest x-ray. FINDINGS: Bandlike atelectasis bilateral lower lobes, lingula and middle lobe. Pulmonary vascular congestion. Cardiomegaly. Calcified minimally tortuous aorta. Post thyroidectomy. Degenerative changes thoracic spine without focal compression fracture. Curvature convex right. Acromioclavicular joint degenerative changes. IMPRESSION: Bandlike atelectasis bilateral lower lobes, lingula and middle lobe. Pulmonary vascular congestion.  Cardiomegaly. Electronically Signed   By: Genia Del M.D.   On: 10/02/2016 17:24    Procedures Procedures (including critical care time)  Medications Ordered in ED Medications  magnesium sulfate IVPB 2 g 50 mL (2 g Intravenous New Bag/Given 10/03/16 0115)  HYDROcodone-acetaminophen (NORCO/VICODIN) 5-325 MG per tablet 2 tablet (2 tablets Oral Given 10/02/16 2238)  furosemide (LASIX) injection 40 mg (40 mg Intravenous Given 10/03/16 0042)  calcium gluconate 1 g in sodium chloride 0.9 % 100 mL IVPB (0 g Intravenous Stopped 10/03/16 0113)     Initial Impression / Assessment and Plan / ED Course  I have reviewed the triage vital signs and the nursing notes.  Pertinent labs & imaging results that were available during my care of the patient were reviewed by me and considered in my medical decision making (see chart for details).  Clinical Course as of Oct 04 154  Mon Oct 02, 2016  2213 No HTN, no tachycardia BP: (!) 112/48 [HM]  2213 Baseline Hemoglobin: (!) 10.5 [HM]  2345 Magnesium: (!) 1.5 [HM]  Tue Oct 03, 2016  0017 Within normal limits B Natriuretic Peptide: 31.3 [HM]  0017 Phosphorus: (!) 4.7 [HM]  0017 Low - decreased from discharge value Calcium: (!) 6.7 [HM]  0155 Discussed with Dr. Blaine Hamper who will admit  [HM]    Clinical Course User Index [HM] [place], Jarrett Soho, PA-C    Patient presents to the emergency department with peripheral edema and dyspnea on exertion. Appears that her weight today of 283 pounds is up from her previous weights of 270-273 pounds.  Chest x-ray shows atelectasis, basilar congestion and cardiomegaly. Diminished breath sounds without rales on exam. ECG is unchanged from last week.  Patient's calcium and magnesium are again low today. We'll begin repletion and IV Lasix.  Her acute kidney injury from previous hospitalization has resolved. Glucose is 121. No evidence of DKA. Pt will need admission for electrolyte repletion and diuresis.    Final Clinical  Impressions(s) / ED Diagnoses   Final diagnoses:  Atypical chest pain  Peripheral edema  Hypocalcemia  Hypomagnesemia  Muscle cramps  DOE (dyspnea on exertion)    New Prescriptions New Prescriptions   No medications on file     Agapito Games 10/03/16 Francene Boyers, MD 10/06/16 [phone]

## 2016-10-03 ENCOUNTER — Encounter (HOSPITAL_COMMUNITY): Payer: Self-pay | Admitting: General Practice

## 2016-10-03 DIAGNOSIS — N611 Abscess of the breast and nipple: Secondary | ICD-10-CM | POA: Diagnosis present

## 2016-10-03 DIAGNOSIS — R079 Chest pain, unspecified: Secondary | ICD-10-CM | POA: Diagnosis not present

## 2016-10-03 DIAGNOSIS — T503X6A Underdosing of electrolytic, caloric and water-balance agents, initial encounter: Secondary | ICD-10-CM | POA: Diagnosis present

## 2016-10-03 DIAGNOSIS — I110 Hypertensive heart disease with heart failure: Secondary | ICD-10-CM | POA: Diagnosis present

## 2016-10-03 DIAGNOSIS — F419 Anxiety disorder, unspecified: Secondary | ICD-10-CM | POA: Diagnosis present

## 2016-10-03 DIAGNOSIS — F1290 Cannabis use, unspecified, uncomplicated: Secondary | ICD-10-CM | POA: Diagnosis present

## 2016-10-03 DIAGNOSIS — F329 Major depressive disorder, single episode, unspecified: Secondary | ICD-10-CM | POA: Diagnosis present

## 2016-10-03 DIAGNOSIS — R0789 Other chest pain: Secondary | ICD-10-CM | POA: Diagnosis not present

## 2016-10-03 DIAGNOSIS — Y92009 Unspecified place in unspecified non-institutional (private) residence as the place of occurrence of the external cause: Secondary | ICD-10-CM | POA: Diagnosis not present

## 2016-10-03 DIAGNOSIS — E785 Hyperlipidemia, unspecified: Secondary | ICD-10-CM

## 2016-10-03 DIAGNOSIS — E890 Postprocedural hypothyroidism: Secondary | ICD-10-CM | POA: Diagnosis present

## 2016-10-03 DIAGNOSIS — Z6841 Body Mass Index (BMI) 40.0 and over, adult: Secondary | ICD-10-CM | POA: Diagnosis not present

## 2016-10-03 DIAGNOSIS — Z9981 Dependence on supplemental oxygen: Secondary | ICD-10-CM | POA: Diagnosis not present

## 2016-10-03 DIAGNOSIS — E119 Type 2 diabetes mellitus without complications: Secondary | ICD-10-CM

## 2016-10-03 DIAGNOSIS — E209 Hypoparathyroidism, unspecified: Secondary | ICD-10-CM | POA: Diagnosis present

## 2016-10-03 DIAGNOSIS — J449 Chronic obstructive pulmonary disease, unspecified: Secondary | ICD-10-CM

## 2016-10-03 DIAGNOSIS — I5042 Chronic combined systolic (congestive) and diastolic (congestive) heart failure: Secondary | ICD-10-CM | POA: Diagnosis present

## 2016-10-03 DIAGNOSIS — Z720 Tobacco use: Secondary | ICD-10-CM | POA: Diagnosis not present

## 2016-10-03 DIAGNOSIS — Z85850 Personal history of malignant neoplasm of thyroid: Secondary | ICD-10-CM | POA: Diagnosis not present

## 2016-10-03 DIAGNOSIS — K219 Gastro-esophageal reflux disease without esophagitis: Secondary | ICD-10-CM | POA: Diagnosis present

## 2016-10-03 DIAGNOSIS — Z79891 Long term (current) use of opiate analgesic: Secondary | ICD-10-CM | POA: Diagnosis not present

## 2016-10-03 DIAGNOSIS — J9811 Atelectasis: Secondary | ICD-10-CM | POA: Diagnosis present

## 2016-10-03 DIAGNOSIS — Z86718 Personal history of other venous thrombosis and embolism: Secondary | ICD-10-CM | POA: Diagnosis not present

## 2016-10-03 DIAGNOSIS — M109 Gout, unspecified: Secondary | ICD-10-CM | POA: Diagnosis present

## 2016-10-03 DIAGNOSIS — Z7984 Long term (current) use of oral hypoglycemic drugs: Secondary | ICD-10-CM | POA: Diagnosis not present

## 2016-10-03 DIAGNOSIS — E669 Obesity, unspecified: Secondary | ICD-10-CM | POA: Diagnosis present

## 2016-10-03 LAB — GLUCOSE, CAPILLARY
GLUCOSE-CAPILLARY: 143 mg/dL — AB (ref 65–99)
GLUCOSE-CAPILLARY: 115 mg/dL — AB (ref 65–99)
Glucose-Capillary: 132 mg/dL — ABNORMAL HIGH (ref 65–99)
Glucose-Capillary: 112 mg/dL — ABNORMAL HIGH (ref 65–99)
Glucose-Capillary: 120 mg/dL — ABNORMAL HIGH (ref 65–99)

## 2016-10-03 LAB — TROPONIN I
Troponin I: <0.03 ng/mL (ref <0.03)
Troponin I: <0.03 ng/mL (ref <0.03)

## 2016-10-03 LAB — I-STAT TROPONIN, ED: Troponin i, poc: 0 ng/mL (ref 0.00–0.08)

## 2016-10-03 MED ORDER — LEVOTHYROXINE SODIUM 100 MCG PO TABS
100.0000 ug | ORAL_TABLET | Freq: Every day | ORAL | Status: DC
  Administered 2016-10-03 – 2016-10-04 (×2): 100 ug via ORAL
  Filled 2016-10-03 (×2): qty 1

## 2016-10-03 MED ORDER — DICLOFENAC SODIUM 1 % TD GEL
2.0000 g | Freq: Four times a day (QID) | TRANSDERMAL | Status: DC | PRN
  Filled 2016-10-03: qty 100

## 2016-10-03 MED ORDER — ONDANSETRON HCL 4 MG/2ML IJ SOLN: 4.0000 mg | Freq: Four times a day (QID) | INTRAMUSCULAR | Status: DC | PRN

## 2016-10-03 MED ORDER — INSULIN ASPART 100 UNIT/ML ~~LOC~~ SOLN
0.0000 [IU] | Freq: Three times a day (TID) | SUBCUTANEOUS | Status: DC
  Administered 2016-10-04: 1 [IU] via SUBCUTANEOUS

## 2016-10-03 MED ORDER — DOXYCYCLINE HYCLATE 100 MG PO TABS
100.0000 mg | ORAL_TABLET | Freq: Two times a day (BID) | ORAL | Status: DC
  Administered 2016-10-03 – 2016-10-04 (×4): 100 mg via ORAL
  Filled 2016-10-03 (×4): qty 1

## 2016-10-03 MED ORDER — EPINEPHRINE PF 1 MG/ML IJ SOLN: 0.3000 mg | Freq: Every day | INTRAMUSCULAR | Status: DC | PRN

## 2016-10-03 MED ORDER — NICOTINE 14 MG/24HR TD PT24
14.0000 mg | MEDICATED_PATCH | Freq: Every day | TRANSDERMAL | Status: DC
  Administered 2016-10-03 – 2016-10-04 (×2): 14 mg via TRANSDERMAL
  Filled 2016-10-03 (×2): qty 1

## 2016-10-03 MED ORDER — SODIUM CHLORIDE 0.9 % IV SOLN
1.0000 g | Freq: Once | INTRAVENOUS | Status: AC
  Administered 2016-10-03: 1 g via INTRAVENOUS
  Filled 2016-10-03: qty 10

## 2016-10-03 MED ORDER — BENZONATATE 100 MG PO CAPS: 200.0000 mg | ORAL_CAPSULE | Freq: Three times a day (TID) | ORAL | Status: DC | PRN

## 2016-10-03 MED ORDER — CARVEDILOL 12.5 MG PO TABS
6.2500 mg | ORAL_TABLET | Freq: Two times a day (BID) | ORAL | Status: DC
  Administered 2016-10-03 – 2016-10-04 (×3): 6.25 mg via ORAL
  Filled 2016-10-03 (×3): qty 1

## 2016-10-03 MED ORDER — HYDROCODONE-ACETAMINOPHEN 5-325 MG PO TABS
1.0000 | ORAL_TABLET | ORAL | Status: DC | PRN
  Administered 2016-10-03 – 2016-10-04 (×3): 2 via ORAL
  Filled 2016-10-03 (×3): qty 2

## 2016-10-03 MED ORDER — TIOTROPIUM BROMIDE MONOHYDRATE 18 MCG IN CAPS
18.0000 ug | ORAL_CAPSULE | Freq: Every day | RESPIRATORY_TRACT | Status: DC
  Administered 2016-10-03 – 2016-10-04 (×2): 18 ug via RESPIRATORY_TRACT
  Filled 2016-10-03: qty 5

## 2016-10-03 MED ORDER — MIRTAZAPINE 15 MG PO TABS
15.0000 mg | ORAL_TABLET | Freq: Every day | ORAL | Status: DC
  Administered 2016-10-03: 15 mg via ORAL
  Filled 2016-10-03: qty 1

## 2016-10-03 MED ORDER — CLONAZEPAM 0.5 MG PO TABS
2.0000 mg | ORAL_TABLET | Freq: Two times a day (BID) | ORAL | Status: DC
  Administered 2016-10-03 – 2016-10-04 (×3): 2 mg via ORAL
  Filled 2016-10-03 (×3): qty 4

## 2016-10-03 MED ORDER — NITROGLYCERIN 0.4 MG SL SUBL: 0.4000 mg | SUBLINGUAL_TABLET | SUBLINGUAL | Status: DC | PRN

## 2016-10-03 MED ORDER — FUROSEMIDE 40 MG PO TABS
40.0000 mg | ORAL_TABLET | Freq: Two times a day (BID) | ORAL | Status: DC
  Administered 2016-10-03 – 2016-10-04 (×2): 40 mg via ORAL
  Filled 2016-10-03 (×2): qty 1

## 2016-10-03 MED ORDER — PANTOPRAZOLE SODIUM 40 MG PO TBEC
40.0000 mg | DELAYED_RELEASE_TABLET | Freq: Every day | ORAL | Status: DC
  Administered 2016-10-03 – 2016-10-04 (×2): 40 mg via ORAL
  Filled 2016-10-03 (×2): qty 1

## 2016-10-03 MED ORDER — ALLOPURINOL 100 MG PO TABS
100.0000 mg | ORAL_TABLET | Freq: Every day | ORAL | Status: DC
  Administered 2016-10-03 – 2016-10-04 (×2): 100 mg via ORAL
  Filled 2016-10-03 (×2): qty 1

## 2016-10-03 MED ORDER — LORATADINE 10 MG PO TABS: 10.0000 mg | ORAL_TABLET | Freq: Every day | ORAL | Status: DC | PRN

## 2016-10-03 MED ORDER — ZOLPIDEM TARTRATE 5 MG PO TABS
5.0000 mg | ORAL_TABLET | Freq: Every evening | ORAL | Status: DC | PRN
  Administered 2016-10-03: 5 mg via ORAL
  Filled 2016-10-03: qty 1

## 2016-10-03 MED ORDER — HYDRALAZINE HCL 20 MG/ML IJ SOLN: 5.0000 mg | INTRAMUSCULAR | Status: DC | PRN

## 2016-10-03 MED ORDER — INSULIN ASPART 100 UNIT/ML ~~LOC~~ SOLN: 0.0000 [IU] | Freq: Every day | SUBCUTANEOUS | Status: DC

## 2016-10-03 MED ORDER — PRAVASTATIN SODIUM 40 MG PO TABS
40.0000 mg | ORAL_TABLET | Freq: Every day | ORAL | Status: DC
  Administered 2016-10-03 – 2016-10-04 (×2): 40 mg via ORAL
  Filled 2016-10-03 (×2): qty 1

## 2016-10-03 MED ORDER — MOMETASONE FURO-FORMOTEROL FUM 100-5 MCG/ACT IN AERO
2.0000 | INHALATION_SPRAY | Freq: Two times a day (BID) | RESPIRATORY_TRACT | Status: DC
  Administered 2016-10-03 – 2016-10-04 (×3): 2 via RESPIRATORY_TRACT
  Filled 2016-10-03 (×2): qty 8.8

## 2016-10-03 MED ORDER — FUROSEMIDE 20 MG PO TABS
40.0000 mg | ORAL_TABLET | Freq: Every day | ORAL | Status: DC
  Administered 2016-10-03: 40 mg via ORAL
  Filled 2016-10-03: qty 2

## 2016-10-03 MED ORDER — DICYCLOMINE HCL 20 MG PO TABS: 20.0000 mg | ORAL_TABLET | Freq: Two times a day (BID) | ORAL | Status: DC | PRN

## 2016-10-03 MED ORDER — ACETAMINOPHEN 325 MG PO TABS: 650.0000 mg | ORAL_TABLET | Freq: Four times a day (QID) | ORAL | Status: DC | PRN

## 2016-10-03 MED ORDER — ENOXAPARIN SODIUM 40 MG/0.4ML ~~LOC~~ SOLN
40.0000 mg | SUBCUTANEOUS | Status: DC
  Administered 2016-10-03 – 2016-10-04 (×2): 40 mg via SUBCUTANEOUS
  Filled 2016-10-03 (×2): qty 0.4

## 2016-10-03 MED ORDER — CALCITRIOL 0.5 MCG PO CAPS
0.5000 ug | ORAL_CAPSULE | Freq: Every day | ORAL | Status: DC
  Administered 2016-10-03 – 2016-10-04 (×2): 0.5 ug via ORAL
  Filled 2016-10-03 (×2): qty 1

## 2016-10-03 MED ORDER — COLCHICINE 0.6 MG PO TABS
0.6000 mg | ORAL_TABLET | Freq: Two times a day (BID) | ORAL | Status: DC
  Administered 2016-10-03 – 2016-10-04 (×3): 0.6 mg via ORAL
  Filled 2016-10-03 (×3): qty 1

## 2016-10-03 MED ORDER — ALBUTEROL SULFATE (2.5 MG/3ML) 0.083% IN NEBU: 2.5000 mg | INHALATION_SOLUTION | RESPIRATORY_TRACT | Status: DC | PRN

## 2016-10-03 MED ORDER — MORPHINE SULFATE (PF) 2 MG/ML IV SOLN: 2.0000 mg | INTRAVENOUS | Status: DC | PRN

## 2016-10-03 MED ORDER — SODIUM CHLORIDE 0.9 % IV SOLN
2.0000 g | Freq: Once | INTRAVENOUS | Status: DC
  Filled 2016-10-03: qty 20

## 2016-10-03 MED ORDER — CALCIUM CARBONATE ANTACID 500 MG PO CHEW
4.0000 | CHEWABLE_TABLET | Freq: Four times a day (QID) | ORAL | Status: DC
  Administered 2016-10-03 – 2016-10-04 (×6): 800 mg via ORAL
  Filled 2016-10-03 (×6): qty 4

## 2016-10-03 MED ORDER — LISINOPRIL 10 MG PO TABS
5.0000 mg | ORAL_TABLET | Freq: Every day | ORAL | Status: DC
  Administered 2016-10-03 – 2016-10-04 (×2): 5 mg via ORAL
  Filled 2016-10-03 (×2): qty 1

## 2016-10-03 NOTE — Progress Notes (Signed)
Nutrition Education Note  RD consulted for nutrition education regarding a Heart Healthy / diabetes diet.   Lipid Panel     Component Value Date/Time   CHOL 167 09/23/2016 0449   TRIG 176 (H) 09/23/2016 0449   HDL 39 (L) 09/23/2016 0449   CHOLHDL 4.3 09/23/2016 0449   VLDL 35 09/23/2016 0449   LDLCALC 93 09/23/2016 0449    Hgb A1c MFr Bld  Date Value Ref Range Status  09/23/2016 5.9 (H) 4.8 - 5.6 % Final    Comment:    (NOTE)         Pre-diabetes: 5.7 - 6.4         Diabetes: >6.4         Glycemic control for adults with diabetes: <7.0     RD provided Heart Healthy CHO modified handout from the [employer] of [employer]. Reviewed patient's dietary recall. Provided examples on ways to decrease sodium and fat intake in diet. Discouraged intake of processed foods and use of salt shaker. Encouraged fresh fruits and vegetables as well as whole grain sources of carbohydrates to maximize fiber intake. Teach back method used.  Expect fair compliance.  Body mass index is 50.24 kg/m. Pt meets criteria for class 3, extreme/morbid obesity based on current BMI.  Current diet order is heart healthy CHO modified, patient is consuming approximately 100% of meals at this time. Labs and medications reviewed. No further nutrition interventions warranted at this time. RD contact information provided. If additional nutrition issues arise, please re-consult RD.   Molli Barrows, RD, LDN, [place] Pager [phone] After Hours Pager [phone]

## 2016-10-03 NOTE — ED Notes (Signed)
Female External catheter applied.

## 2016-10-03 NOTE — ED Notes (Signed)
Dr Niu at bedside 

## 2016-10-03 NOTE — H&P (Signed)
History and Physical    Michele Owens:[id number] DOB: 12/16/1960 DOA: 10/02/2016  Referring MD/NP/PA:   PCP: [name], MD   Patient coming from:  The patient is coming from home.  At baseline, pt is independent for most of ADL.   Chief Complaint: chest pain, foot swelling and left breast boil  HPI: Michele Owens is a 56 y.o. female with medical history significant of hypertension, hyperlipidemia, diabetes mellitus, COPD, GERD, hypothyroidism, gout, DVT, depression, anxiety, dCHF, obesity, thyroid cancer (s/p of surgery 09/18/16), tobacco abuse, who presents with chest pain, foot swelling and left breast boil.  Pt was recently hospitalized from 06/15-6/18 due to symptomatic hypercalcemia and also hypomagnesemia. Pt underwent total thyroidectomy due to thyroid cancer on 09/18/16 by Dr. [name]. Her hypercalcemia was likely related to thyroidectomy. Her PTH was found to be low at 9 and 13 on two measurements in previous admission. Pt was discharged at stable condition after her electrolyte disturbances was corrected. Her oral calcium carbonate was increased to 800 mg of elemental calcium 4 times daily. Calcitriol 0.5 MCG was added.  Today patient reports that she developed chest pain since yesterday morning. It is located in the substernal area, 8 out of 10 in severity, pressure-like, nonradiating. Patient denies SOB, cough, fever or chills. No calf pain. She reports bilateral foot swelling. No nausea, vomiting, diarrhea, abdominal pain, symptoms of UTI or unilateral weakness. Patient states that she had left breast boil 2 weeks ago, which was treated with antibiotic (possibly doxycycline for 7 days). He felt better, but today the boil comes back in left breast.   ED Course: pt was found to have WBC 14.5, BNP 31.3, electrolytes renal function okay, calcium 6.7, magnesium 1.5, temperature normal, oxygen saturation 97% on room air. Pt is placed on tele bed for obs.  CXR showed:  1.  Cardiomegaly with development of bibasilar atelectasis or pneumonia 2. Interval finding of convex right hilar enlargement ; uncertain if this is due to enlarged hilar vessels, vascular abnormality, or mass. CT is recommended for further evaluation. 3. Interval postsurgical changes at the thoracic inlet.   Review of Systems:   General: no fevers, chills, has poor appetite, has fatigue HEENT: no blurry vision, hearing changes or sore throat Respiratory: no dyspnea, coughing, wheezing CV: has chest pain, no palpitations GI: no nausea, vomiting, abdominal pain, diarrhea, constipation GU: no dysuria, burning on urination, increased urinary frequency, hematuria  Ext: has leg edema Neuro: no unilateral weakness, numbness, or tingling, no vision change or hearing loss Skin: has a boil in left breast MSK: No muscle spasm, no deformity, no limitation of range of movement in spin Heme: No easy bruising.  Travel history: No recent long distant travel.  Allergy:  Allergies  Allergen Reactions  . Bee Venom Swelling and Other (See Comments)    "All over my body" (swelling)  . Ibuprofen Rash and Other (See Comments)    Severe rash  . Lamisil [Terbinafine Hcl] Rash and Other (See Comments)    Pt states this causes her to "feel funny"  . Nsaids Other (See Comments)    Per MD's orders     Past Medical History:  Diagnosis Date  . Anxiety   . Arthritis   . Asthma   . Chronic diastolic CHF (congestive heart failure) ([place])   . COPD (chronic obstructive pulmonary disease) (HCC)    Uses Oxygen at night  . Depression   . Diabetes mellitus without complication ([place])   . Dyspnea  occ  . GERD (gastroesophageal reflux disease)   . Gout   . Headache    migraines  . History of DVT of lower extremity   . History of nuclear stress test    Myoview 2/17:  Low risk stress nuclear study with a small, moderate intensity, partially reversible inferior lateral defect consistent with small prior infarct  and minimal peri-infarct ischemia; EF 68 with normal wall motion  . Hypertension   . Pneumonia   . Thyroid cancer ([place]) 09/23/2016    Past Surgical History:  Procedure Laterality Date  . BUNIONECTOMY Bilateral   . CARDIAC CATHETERIZATION N/A 06/23/2015   Procedure: Right/Left Heart Cath and Coronary Angiography;  Surgeon: [name], MD;  Location: [facility];  Service: Cardiovascular;  Laterality: N/A;  . COLONOSCOPY WITH PROPOFOL N/A 12/15/2015   Procedure: COLONOSCOPY WITH PROPOFOL;  Surgeon: [name], MD;  Location: [facility];  Service: Endoscopy;  Laterality: N/A;  . THYROIDECTOMY  09/19/2016  . THYROIDECTOMY N/A 09/19/2016   Procedure: TOTAL THYROIDECTOMY;  Surgeon: [name], MD;  Location: [facility];  Service: General;  Laterality: N/A;  . TONSILLECTOMY    . TOTAL ABDOMINAL HYSTERECTOMY  07/14/10    Social History:  reports that she quit smoking about 2 months ago. Her smoking use included Cigarettes. She has a 20.50 pack-year smoking history. She has never used smokeless tobacco. She reports that she drinks alcohol. She reports that she uses drugs, including Marijuana.  Family History:  Family History  Problem Relation Age of Onset  . Cancer Father   . Diabetes Mother      Prior to Admission medications   Medication Sig Start Date End Date Taking? Authorizing Provider  acetaminophen (TYLENOL) 325 MG tablet Take 2 tablets (650 mg total) by mouth every 6 (six) hours as needed for mild pain or headache. Caution: Acetaminophen from all sources not to exceed greater than 3 g per day. 09/25/16  Yes Hongalgi, [name], MD  albuterol (ACCUNEB) 1.25 MG/3ML nebulizer solution Take 1 ampule by nebulization 2 (two) times daily.   Yes [provider]  albuterol (PROVENTIL HFA;VENTOLIN HFA) 108 (90 BASE) MCG/ACT inhaler Inhale 1-2 puffs into the lungs every 6 (six) hours as needed for wheezing. 04/09/12  Yes Kindl, Nelda Severe, MD  mometasone-formoterol (DULERA) 100-5  MCG/ACT AERO Inhale 2 puffs into the lungs 2 (two) times daily.   Yes [provider]  nicotine (NICODERM CQ - DOSED IN MG/24 HOURS) 14 mg/24hr patch Place 1 patch (14 mg total) onto the skin daily. 07/25/16  Yes Lavina Hamman, MD  omeprazole (PRILOSEC) 40 MG capsule Take 40 mg by mouth daily. 04/13/15  Yes [provider]  tiotropium (SPIRIVA) 18 MCG inhalation capsule Place 18 mcg into inhaler and inhale daily.   Yes [provider]  allopurinol (ZYLOPRIM) 100 MG tablet Take 100 mg by mouth daily.    [provider]  benzonatate (TESSALON) 200 MG capsule Take 1 capsule (200 mg total) by mouth 3 (three) times daily as needed for cough. 07/24/16   Lavina Hamman, MD  budesonide-formoterol [facility]) 80-4.5 MCG/ACT inhaler Inhale 2 puffs into the lungs 2 (two) times daily. 07/24/16   Lavina Hamman, MD  calcitRIOL (ROCALTROL) 0.5 MCG capsule Take 1 capsule (0.5 mcg total) by mouth daily. 09/26/16   Hongalgi, [name], MD  calcium carbonate (TUMS) 500 MG chewable tablet Chew 4 tablets (800 mg of elemental calcium total) by mouth 4 (four) times daily. 09/25/16  Hongalgi, [name], MD  carvedilol (COREG) 6.25 MG tablet TAKE ONE TABLET BY MOUTH TWICE DAILY WITH A MEAL 08/22/16   Bensimhon, [name], MD  clonazePAM (KLONOPIN) 2 MG tablet Take 1 tablet (2 mg total) by mouth 2 (two) times daily. 01/27/16   Mikhail, Velta Addison, DO  colchicine 0.6 MG tablet Take 0.6 mg by mouth 2 (two) times daily.    [provider]  diclofenac sodium (VOLTAREN) 1 % GEL Apply 2 g topically 4 (four) times daily as needed (for pain).     [provider]  dicyclomine (BENTYL) 20 MG tablet Take 1 tablet (20 mg total) by mouth 2 (two) times daily as needed (abdominal pain/cramping). 11/28/15   Antonietta Breach, PA-C  EPINEPHrine 0.3 mg/0.3 mL IJ SOAJ injection Inject 0.3 mg into the muscle daily as needed (allergic reaction).    [provider]  furosemide (LASIX) 40 MG tablet Take  1 tablet (40 mg total) by mouth daily. 09/25/16   Hongalgi, [name], MD  HYDROcodone-acetaminophen (NORCO/VICODIN) 5-325 MG tablet Take 1-2 tablets by mouth every 4 (four) hours as needed for moderate pain. 09/20/16   [name], MD  lisinopril (PRINIVIL,ZESTRIL) 5 MG tablet Take 1 tablet (5 mg total) by mouth daily. 08/01/16   [name], MD  loratadine (CLARITIN) 10 MG tablet Take 10 mg by mouth daily as needed for allergies.     [provider]  metFORMIN (GLUCOPHAGE) 500 MG tablet Take 1 tablet (500 mg total) by mouth 2 (two) times daily with a meal. 12/11/14   [name], MD  mirtazapine (REMERON) 15 MG tablet Take 15 mg by mouth at bedtime. 11/16/14   [provider]  potassium chloride SA (K-DUR,KLOR-CON) 20 MEQ tablet Take 2 tablets (40 mEq total) by mouth daily. 08/01/16   [name], MD  pravastatin (PRAVACHOL) 40 MG tablet Take 40 mg by mouth daily.  03/29/15   [provider]  SYNTHROID 100 MCG tablet Take 1 tablet (100 mcg total) by mouth daily. 09/20/16   [name], MD    Physical Exam: Vitals:   10/02/16 2300 10/03/16 0245 10/03/16 0300 10/03/16 0344  BP: 115/65 125/69 (!) 113/53 124/68  Pulse: 74 79  71  Resp: (!) 22 20 17 17   Temp:    98.3 F (36.8 C)  TempSrc:    Oral  SpO2: 97% 96%  100%  Weight:    132.8 kg (292 lb 11.2 oz)  Height:    5\' 4"  (1.626 m)   General: Not in acute distress HEENT:       Eyes: PERRL, EOMI, no scleral icterus.       ENT: No discharge from the ears and nose, no pharynx injection, no tonsillar enlargement.        Neck: No JVD, no bruit, no mass felt. Heme: No neck lymph node enlargement. Cardiac: S1/S2, RRR, No murmurs, No gallops or rubs. Respiratory:  No rales, wheezing, rhonchi or rubs. GI: Soft, nondistended, nontender, no rebound pain, no organomegaly, BS present. GU: No hematuria Ext: 1+ pitting leg edema bilaterally. 2+DP/PT pulse bilaterally. Musculoskeletal: No joint deformities, No  joint redness or warmth, no limitation of ROM in spin. Skin: has a boil in left breast with surrounding erythema and tenderness.  Neuro: Alert, oriented X3, cranial nerves II-XII grossly intact, moves all extremities normally.  Psych: Patient is not psychotic, no suicidal or hemocidal ideation.  Labs on Admission: I have personally reviewed following labs and imaging studies  CBC:  Recent Labs Lab 10/02/16 1658  WBC 14.5*  HGB 10.5*  HCT 35.4*  MCV 87.6  PLT 626   Basic Metabolic Panel:  Recent Labs Lab 10/02/16 1658  NA 141  K 3.7  CL 101  CO2 30  GLUCOSE 121*  BUN <5*  CREATININE 0.69  CALCIUM 6.7*  MG 1.5*  PHOS 4.7*   GFR: Estimated Creatinine Clearance: 106.5 mL/min (by C-G formula based on SCr of 0.69 mg/dL). Liver Function Tests: No results for input(s): AST, ALT, ALKPHOS, BILITOT, PROT, ALBUMIN in the last 168 hours. No results for input(s): LIPASE, AMYLASE in the last 168 hours. No results for input(s): AMMONIA in the last 168 hours. Coagulation Profile: No results for input(s): INR, PROTIME in the last 168 hours. Cardiac Enzymes:  Recent Labs Lab 10/03/16 0325  TROPONINI <0.03   BNP (last 3 results) No results for input(s): PROBNP in the last 8760 hours. HbA1C: No results for input(s): HGBA1C in the last 72 hours. CBG: No results for input(s): GLUCAP in the last 168 hours. Lipid Profile: No results for input(s): CHOL, HDL, LDLCALC, TRIG, CHOLHDL, LDLDIRECT in the last 72 hours. Thyroid Function Tests: No results for input(s): TSH, T4TOTAL, FREET4, T3FREE, THYROIDAB in the last 72 hours. Anemia Panel: No results for input(s): VITAMINB12, FOLATE, FERRITIN, TIBC, IRON, RETICCTPCT in the last 72 hours. Urine analysis:    Component Value Date/Time   COLORURINE YELLOW 09/23/2016 0316   APPEARANCEUR CLEAR 09/23/2016 0316   LABSPEC 1.012 09/23/2016 0316   PHURINE 5.0 09/23/2016 0316   GLUCOSEU NEGATIVE 09/23/2016 0316   HGBUR NEGATIVE 09/23/2016  0316   BILIRUBINUR NEGATIVE 09/23/2016 0316   KETONESUR NEGATIVE 09/23/2016 0316   PROTEINUR NEGATIVE 09/23/2016 0316   UROBILINOGEN 0.2 02/28/2012 2004   NITRITE NEGATIVE 09/23/2016 0316   LEUKOCYTESUR NEGATIVE 09/23/2016 0316   Sepsis Labs: @LABRCNTIP (procalcitonin:4,lacticidven:4) )No results found for this or any previous visit (from the past 240 hour(s)).   Radiological Exams on Admission: Dg Chest 2 View  Result Date: 10/02/2016 CLINICAL DATA:  56 year old female with shortness of breath. Hypertension, diabetes, COPD. Initial encounter. EXAM: CHEST  2 VIEW COMPARISON:  09/23/2016 CT and 09/23/2016 chest x-ray. FINDINGS: Bandlike atelectasis bilateral lower lobes, lingula and middle lobe. Pulmonary vascular congestion. Cardiomegaly. Calcified minimally tortuous aorta. Post thyroidectomy. Degenerative changes thoracic spine without focal compression fracture. Curvature convex right. Acromioclavicular joint degenerative changes. IMPRESSION: Bandlike atelectasis bilateral lower lobes, lingula and middle lobe. Pulmonary vascular congestion. Cardiomegaly. Electronically Signed   By: Genia Del M.D.   On: 10/02/2016 17:24     EKG: Independently reviewed. Sinus rhythm, QTC 468, low voltage, nonspecific T-wave change.    Assessment/Plan Principal Problem:   Hypocalcemia Active Problems:   Tobacco abuse   Dyslipidemia associated with type 2 diabetes mellitus (HCC)   Controlled type 2 diabetes mellitus without complication, without long-term current use of insulin (HCC)   Chest pain   Chronic combined systolic (congestive) and diastolic (congestive) heart failure (HCC)   HLD (hyperlipidemia)   COPD (chronic obstructive pulmonary disease) (HCC)   Gout   Hypomagnesemia   Boil, breast   Hypocalcemia and hypomagnesemia: Ca 6.7 and Mg 1.5. Her PTH was found to be low at 9 and 13 on two measurements in previous admission, indicating hypoparathyroidism.  -will place on tele bed for  obs -IV calcium gluconate, total of 3 gram by IV -IV magnesium sulfate 2 g  -continue home calcium carbonate 800 mg of elemental calcium 4 times daily and Calcitriol 0.5 mcg daily -f/u  by BMP  Chest pain: Patient has a typical chest pain.  Etiology is not clear. Has hx of DVT, but LE venous doppler was negative for DVT 09/23/16. She had negative V/Q scan on 09/23/16. Pt has leukocytosis, but pt has no fever, and denies cough or SOB, chest x-ray did not show obvious infiltration, less likely to have pneumonia. Her leukocytosis likely due to left breast boil. - cycle CE q6 x3 and repeat EKG in the am  - prn Nitroglycerin, Morphine, pravastatin, Coreg - no ASA due to allergy to NSIADs (severe rashes)  DM-II: Last A1c 5.9 on 09/23/16, well controled. Patient is taking metformin at home -SSI  HTN: -on Lasix, lisinopril, coreg -IV hydralazine when necessary  Chronic combined systolic (congestive) and diastolic (congestive) heart failure ([place]): recent 2-D echo on 09/24/16 showed EF of 55-60. Patient has 1+ leg edema, but no acute respiratory distress, BNP is 31.3. CHF seems to be compensated. -pt received 40 mg of iv lasix in ED -will continue home dose of Lasix, 40 mg daily  COPD (chronic obstructive pulmonary disease) ([place]): stable. No wheezing or rhonchi on auscultation.  -Continue breathing treatment  Gout: -continue home allopurinol and Colchicine  Thyroid cancer ([place]): s/p of thyroidectomy on 4 days ago. TSH is 6.713 -continue home synthroid  Depression and anxiety: Stable, no suicidal or homicidal ideations. -Continue home medications  Boil in left breast -doxycycline 100 mg bid for 7 days   DVT ppx: SQ Lovenox Code Status: Full code Family Communication: None at bed side.  Disposition Plan:  Anticipate discharge back to previous home environment Consults called:  none Admission status: Obs / tele     Date of Service 10/03/2016    Ivor Costa [facility] Pager [phone]  If 7PM-7AM, please contact night-coverage [email or web address] Password [email or web address] 10/03/2016, 5:59 AM

## 2016-10-03 NOTE — ED Notes (Signed)
Pt reports the external catheter is uncomfortable and ambulated to the restroom with steady gait.

## 2016-10-03 NOTE — Care Management Note (Addendum)
Case Management Note  Patient Details  Name: Michele Owens MRN: [id number] Date of Birth: [birth year]  Subjective/Objective: Pt presented for Hypocalcemia. Pt is from home alone- lives in an  [place]. Pt has DME Cane, BSC and 02 2L via [employer]. Pt has portable 02 in the room. Pt has an aide via [place] 7 days wk/ 2 hrs a day. Pt states she uses Medicaid Transportation and she has Haematologist that delivers her medications. PCP: Dr. Jonelle Owens. Pt in need of Shower Chair. Order placed for Shower Chair and referral sent to [facility] for DME. Order to be shipped to address in [place]-Pt is aware.                   Action/Plan: Per pt she will not need any HH Care once stable for d/c. CSW to assist with Medicaid Transportation. No further needs from CM at this time.   Expected Discharge Date:                  Expected Discharge Plan:  Home/Self Care  In-House Referral:  Clinical Social Work  Discharge planning Services  CM Consult  Post Acute Care Choice:  Durable Medical Equipment Choice offered to:  Patient  DME Arranged:  Shower stool DME Agency:  [employer]pt refused [facility] to bring shower chair. Dr. Leontine Owens office to deliver shower chair  Today. CM did make [facility] aware.   HH Arranged: Registered Nurse [place] Agency: [facility]  Status of Service:  Completed, signed off  If discussed at [place] of Stay Meetings, dates discussed:    Additional Comments: 1426 10-03-16 Michele Krauss, RN,BSN [phone] Pt is active with [facility] for RN. Pt will need resumption orders.  Michele Roys, RN 10/03/2016, 12:07 PM

## 2016-10-03 NOTE — Progress Notes (Addendum)
Verified with patients pharmacy ([facility]). she takes Lasix 40mg  po BID at home. Notified Dr. Wendee Beavers. Order changed to home dose. Cont to monitor. Carroll Kinds RN

## 2016-10-03 NOTE — Progress Notes (Signed)
Patient seen and evaluated earlier this a.m. by my associate. Please refer to H&P for detail regarding history assessment and plan. Troponins 3 negative.  Patient is in for chest pain rule out. She had VQ scan on 09/23/2016 which was negative per history of present illness. Is presenting with left breast boil and chest pain that is reproducible on palpation.  Continue telemetry monitoring and will obtain EKG next a.m.  General: Pt in nad, alert and awake CV: s1 and s2 wnl, no rubs Pulm: No increased work of breathing, no wheezes  [place], [employer]

## 2016-10-04 ENCOUNTER — Ambulatory Visit: Payer: Medicaid Other | Admitting: Acute Care

## 2016-10-04 LAB — CBC
HEMATOCRIT: 35.3 % — AB (ref 36.0–46.0)
HEMOGLOBIN: 10.4 g/dL — AB (ref 12.0–15.0)
MCH: 25.7 pg — AB (ref 26.0–34.0)
MCHC: 29.5 g/dL — AB (ref 30.0–36.0)
MCV: 87.4 fL (ref 78.0–100.0)
Platelets: 385 10*3/uL (ref 150–400)
RBC: 4.04 MIL/uL (ref 3.87–5.11)
RDW: 16.4 % — AB (ref 11.5–15.5)
WBC: 13.5 10*3/uL — ABNORMAL HIGH (ref 4.0–10.5)

## 2016-10-04 LAB — GLUCOSE, CAPILLARY
GLUCOSE-CAPILLARY: 126 mg/dL — AB (ref 65–99)
GLUCOSE-CAPILLARY: 94 mg/dL (ref 65–99)

## 2016-10-04 LAB — BASIC METABOLIC PANEL
Anion gap: 10 (ref 5–15)
CALCIUM: 7.3 mg/dL — AB (ref 8.9–10.3)
CHLORIDE: 99 mmol/L — AB (ref 101–111)
CO2: 31 mmol/L (ref 22–32)
Creatinine, Ser: 0.69 mg/dL (ref 0.44–1.00)
GFR calc Af Amer: >60 mL/min (ref >60)
GFR calc non Af Amer: >60 mL/min (ref >60)
Glucose, Bld: 150 mg/dL — ABNORMAL HIGH (ref 65–99)
Potassium: 3.4 mmol/L — ABNORMAL LOW (ref 3.5–5.1)
SODIUM: 140 mmol/L (ref 135–145)

## 2016-10-04 LAB — MAGNESIUM: Magnesium: 1.7 mg/dL (ref 1.7–2.4)

## 2016-10-04 MED ORDER — SODIUM CHLORIDE 0.9 % IV SOLN
1.0000 g | Freq: Once | INTRAVENOUS | Status: AC
  Administered 2016-10-04: 1 g via INTRAVENOUS
  Filled 2016-10-04: qty 10

## 2016-10-04 MED ORDER — ACETAMINOPHEN 325 MG PO TABS: 325.0000 mg | ORAL_TABLET | Freq: Four times a day (QID) | ORAL | Status: DC | PRN

## 2016-10-04 MED ORDER — DOXYCYCLINE HYCLATE 100 MG PO TABS: 100.0000 mg | ORAL_TABLET | Freq: Two times a day (BID) | ORAL | 0 refills | Status: AC

## 2016-10-04 NOTE — Discharge Summary (Addendum)
Physician Discharge Summary  Michele Owens LDJ:[id number] DOB: 08/27/1960 DOA: 10/02/2016  PCP: [name], MD  Admit date: 10/02/2016 Discharge date: 10/04/2016  Time spent: > 35 minutes  Recommendations for Outpatient Follow-up:  Monitor calcium levels Reassess potassium levels Ensure patient follows up with I have had to explain several times patient's general and overall medical condition of which she seems to have difficulty understanding. She was not aware that her thyroidectomy was secondary to removal of thyroid cancer. We will set up nursing assistance was she transitions home.  Discharge Diagnoses:  Principal Problem:   Hypocalcemia Active Problems:   Tobacco abuse   Dyslipidemia associated with type 2 diabetes mellitus (HCC)   Controlled type 2 diabetes mellitus without complication, without long-term current use of insulin (HCC)   Chest pain   Chronic combined systolic (congestive) and diastolic (congestive) heart failure (HCC)   HLD (hyperlipidemia)   COPD (chronic obstructive pulmonary disease) (HCC)   Gout   Hypomagnesemia   Boil, breast   Discharge Condition: Stable  Diet recommendation: Heart healthy, carb modified  Filed Weights   10/02/16 1614 10/03/16 0344 10/04/16 0557  Weight: 128.4 kg (283 lb) 132.8 kg (292 lb 11.2 oz) 131.3 kg (289 lb 8 oz)    History of present illness:   56 y.o. female with medical history significant of hypertension, hyperlipidemia, diabetes mellitus, COPD, GERD, hypothyroidism, gout, DVT, depression, anxiety, dCHF, obesity, thyroid cancer (s/p of surgery 09/18/16), tobacco abuse, who presents with chest pain, foot swelling and left breast boil.  Troponins were negative x 3.   Hospital Course:  Principal Problem:   Hypocalcemia - Secondary to hypoparathyroidism - Patient was not taking her calcium replacement therapy appropriately. We will provide home health nursing to assist patient with medication management and  further teaching at home. - Patient underwent total thyroidectomy due to thyroid cancer on 09/18/2016 by Dr. [name] patient reports follow-up soon. I have indicated she is to keep the appointment - Addendum: Was 7.3 on day of discharge I have placed orders for calcium gluconate 1 g to be infused IV prior to discharge. I have discussed how patient is supposed to take her oral calcium replacement appropriately with her.  Active Problems:   Tobacco abuse   Dyslipidemia associated with type 2 diabetes mellitus ([place])   Controlled type 2 diabetes mellitus without complication, without long-term current use of insulin ([place]) - Patient to continue her prior to admission medication regimen. Home health nursing to assist and recommend diabetic diet. Patient is on metformin will continue last serum creatinine 0.6    Chest pain - Completely resolved with troponins negative 3 - Patient had reproducibility with palpation as such suspect was more muscle related. Completely atypical    Chronic combined systolic (congestive) and diastolic (congestive) heart failure (HCC) -Compensated on day of discharge. Continue Lasix 40 mg by mouth twice a day, carvedilol, and lisinopril    HLD (hyperlipidemia) - Patient is on statin will recommend continuation of medication and follow-up with primary care physician    COPD (chronic obstructive pulmonary disease) ([place]) -Stable continue maintenance medication.    Gout - Continue allopurinol. And colchicine. Stable no symptoms reported on discharge    Hypomagnesemia -Resolved on last check was 1.7    Boil, breast - Discharge on doxycycline for 6 more days to complete a seven-day treatment course   Procedures:  None  Consultations:  None  Discharge Exam: Vitals:   10/04/16 0830 10/04/16 1146  BP:  108/61  Pulse:  67  Resp: 16 17  Temp:  97.9 F (36.6 C)    General: Pt in nad, alert and awake Cardiovascular: rrr, no rubs Respiratory: no increased  wob, no wheezes  Discharge Instructions   Discharge Instructions    Call MD for:  persistant nausea and vomiting    Complete by:  As directed    Call MD for:  temperature >100.4    Complete by:  As directed    Diet - low sodium heart healthy    Complete by:  As directed    Increase activity slowly    Complete by:  As directed      Current Discharge Medication List    START taking these medications   Details  doxycycline (VIBRA-TABS) 100 MG tablet Take 1 tablet (100 mg total) by mouth every 12 (twelve) hours. Qty: 12 tablet, Refills: 0      CONTINUE these medications which have CHANGED   Details  acetaminophen (TYLENOL) 325 MG tablet Take 1 tablet (325 mg total) by mouth every 6 (six) hours as needed for mild pain or headache. Caution: Acetaminophen from all sources not to exceed greater than 3 g per day.      CONTINUE these medications which have NOT CHANGED   Details  albuterol (ACCUNEB) 1.25 MG/3ML nebulizer solution Take 1 ampule by nebulization 2 (two) times daily.    albuterol (PROVENTIL HFA;VENTOLIN HFA) 108 (90 BASE) MCG/ACT inhaler Inhale 1-2 puffs into the lungs every 6 (six) hours as needed for wheezing. Qty: 1 Inhaler, Refills: 0    allopurinol (ZYLOPRIM) 100 MG tablet Take 100 mg by mouth daily.    budesonide-formoterol (SYMBICORT) 80-4.5 MCG/ACT inhaler Inhale 2 puffs into the lungs 2 (two) times daily. Qty: 1 Inhaler, Refills: 0    calcitRIOL (ROCALTROL) 0.5 MCG capsule Take 1 capsule (0.5 mcg total) by mouth daily. Qty: 30 capsule, Refills: 0    calcium carbonate (TUMS) 500 MG chewable tablet Chew 4 tablets (800 mg of elemental calcium total) by mouth 4 (four) times daily. Qty: 90 tablet, Refills: 1    carvedilol (COREG) 6.25 MG tablet TAKE ONE TABLET BY MOUTH TWICE DAILY WITH A MEAL Qty: 60 tablet, Refills: 6    clonazePAM (KLONOPIN) 2 MG tablet Take 1 tablet (2 mg total) by mouth 2 (two) times daily. Qty: 30 tablet, Refills: 0    colchicine 0.6 MG  tablet Take 0.6 mg by mouth 2 (two) times daily.    diclofenac sodium (VOLTAREN) 1 % GEL Apply 2 g topically 4 (four) times daily as needed (for pain).     furosemide (LASIX) 40 MG tablet Take 1 tablet (40 mg total) by mouth daily.    HYDROcodone-acetaminophen (NORCO/VICODIN) 5-325 MG tablet Take 1-2 tablets by mouth every 4 (four) hours as needed for moderate pain. Qty: 20 tablet, Refills: 0    lisinopril (PRINIVIL,ZESTRIL) 5 MG tablet Take 1 tablet (5 mg total) by mouth daily. Qty: 30 tablet, Refills: 6    loratadine (CLARITIN) 10 MG tablet Take 10 mg by mouth daily as needed for allergies.     metFORMIN (GLUCOPHAGE) 500 MG tablet Take 1 tablet (500 mg total) by mouth 2 (two) times daily with a meal. Qty: 30 tablet, Refills: 1    mirtazapine (REMERON) 15 MG tablet Take 15 mg by mouth at bedtime. Refills: 0    mometasone-formoterol (DULERA) 100-5 MCG/ACT AERO Inhale 2 puffs into the lungs 2 (two) times daily.    nicotine (NICODERM CQ - DOSED IN MG/24 HOURS) 14 mg/24hr patch  Place 1 patch (14 mg total) onto the skin daily. Qty: 28 patch, Refills: 0    omeprazole (PRILOSEC) 40 MG capsule Take 40 mg by mouth daily. Refills: 2    potassium chloride SA (K-DUR,KLOR-CON) 20 MEQ tablet Take 2 tablets (40 mEq total) by mouth daily. Qty: 60 tablet, Refills: 3    pravastatin (PRAVACHOL) 40 MG tablet Take 40 mg by mouth every evening.  Refills: 2    SYNTHROID 100 MCG tablet Take 1 tablet (100 mcg total) by mouth daily. Qty: 30 tablet, Refills: 3    tiotropium (SPIRIVA) 18 MCG inhalation capsule Place 18 mcg into inhaler and inhale daily.    benzonatate (TESSALON) 200 MG capsule Take 1 capsule (200 mg total) by mouth 3 (three) times daily as needed for cough. Qty: 30 capsule, Refills: 0    dicyclomine (BENTYL) 20 MG tablet Take 1 tablet (20 mg total) by mouth 2 (two) times daily as needed (abdominal pain/cramping). Qty: 20 tablet, Refills: 0    EPINEPHrine 0.3 mg/0.3 mL IJ SOAJ  injection Inject 0.3 mg into the muscle daily as needed (allergic reaction).       Allergies  Allergen Reactions  . Bee Venom Swelling and Other (See Comments)    "All over my body" (swelling)  . Ibuprofen Rash and Other (See Comments)    Severe rash  . Lamisil [Terbinafine Hcl] Rash and Other (See Comments)    Pt states this causes her to "feel funny"  . Nsaids Other (See Comments)    Per MD's orders    [facility] Follow up.   Why:  Shower Chair to be shipped to patient- hopefully will receive by Friday.  Contact information: [place] [phone]        [facility], [facility] Follow up.   Specialty:  Home Health Services Why:  Registered Nurse Contact information: [address] [place] [address] [phone]            The results of significant diagnostics from this hospitalization (including imaging, microbiology, ancillary and laboratory) are listed below for reference.    Significant Diagnostic Studies: Dg Chest 2 View  Result Date: 10/02/2016 CLINICAL DATA:  56 year old female with shortness of breath. Hypertension, diabetes, COPD. Initial encounter. EXAM: CHEST  2 VIEW COMPARISON:  09/23/2016 CT and 09/23/2016 chest x-ray. FINDINGS: Bandlike atelectasis bilateral lower lobes, lingula and middle lobe. Pulmonary vascular congestion. Cardiomegaly. Calcified minimally tortuous aorta. Post thyroidectomy. Degenerative changes thoracic spine without focal compression fracture. Curvature convex right. Acromioclavicular joint degenerative changes. IMPRESSION: Bandlike atelectasis bilateral lower lobes, lingula and middle lobe. Pulmonary vascular congestion. Cardiomegaly. Electronically Signed   By: Genia Del M.D.   On: 10/02/2016 17:24   Dg Chest 2 View  Result Date: 09/23/2016 CLINICAL DATA:  Chest pain EXAM: CHEST  2 VIEW COMPARISON:  08/23/2016, 07/21/2016,  07/20/2016 FINDINGS: Cardiomegaly. Development of bibasilar atelectasis or pneumonia. Possible small effusions. Interim finding of a right convex hilar enlargement. No pneumothorax. Surgical clips at the thoracic inlet. IMPRESSION: 1. Cardiomegaly with development of bibasilar atelectasis or pneumonia 2. Interval finding of convex right hilar enlargement ; uncertain if this is due to enlarged hilar vessels, vascular abnormality, or mass. CT is recommended for further evaluation. 3. Interval postsurgical changes at the thoracic inlet. Electronically Signed   By: Donavan Foil M.D.   On: 09/23/2016 00:53   Ct Chest W Contrast  Result  Date: 09/23/2016 CLINICAL DATA:  Acute onset of generalized abdominal cramping and weakness. Left-sided chest pain and intermittent shortness of breath. Recent thyroid surgery. EXAM: CT CHEST WITH CONTRAST TECHNIQUE: Multidetector CT imaging of the chest was performed during intravenous contrast administration. CONTRAST:  75 mL of Isovue 300 IV contrast COMPARISON:  Chest radiograph performed earlier today at 12:24 a.m., and CT of the chest performed 01/23/2016 FINDINGS: Cardiovascular: The heart is borderline enlarged. Minimal calcification is seen at the aortic arch. The great vessels are within normal limits. Mediastinum/Nodes: The mediastinum is otherwise unremarkable in appearance. No mediastinal lymphadenopathy is seen. No pericardial effusion is identified. Prominent soft tissue edema is noted about the thyroid bed, reflecting recent thyroidectomy. No axillary lymphadenopathy is appreciated. Lungs/Pleura: Patchy bibasilar airspace opacities most likely reflect atelectasis, though infection might conceivably have a similar appearance. No pleural effusion or pneumothorax is seen. An apparent 7 mm nodule is noted at the medial aspect of the right lung base (image 78 of 144). This is less prominent than in 2017 and may simply reflect atelectasis. Upper Abdomen: The visualized  portions of the liver and spleen are grossly unremarkable. The visualized portions of the pancreas, adrenal glands and kidneys are within normal limits. Musculoskeletal: No acute osseous abnormalities are identified. Anterior bridging osteophytes are noted along the lower thoracic spine. The visualized musculature is unremarkable in appearance. IMPRESSION: 1. Patchy bibasilar airspace opacities most likely reflect atelectasis, though infection might have a similar appearance, depending on the patient's symptoms. 2. Borderline cardiomegaly. 3. Prominent soft tissue edema about the thyroid bed, reflecting recent thyroidectomy. Electronically Signed   By: Garald Balding M.D.   On: 09/23/2016 03:34   Nm Pulmonary Perf And Vent  Result Date: 09/23/2016 CLINICAL DATA:  Shortness of breath and leg cramps. Recent thyroidectomy. EXAM: NUCLEAR MEDICINE VENTILATION - PERFUSION LUNG SCAN TECHNIQUE: Ventilation images were obtained in multiple projections using inhaled aerosol Tc-20m DTPA. Perfusion images were obtained in multiple projections after intravenous injection of Tc-23m MAA. RADIOPHARMACEUTICALS:  31.8 mCi Technetium-61m DTPA aerosol inhalation and 4.2 mCi Technetium-64m MAA IV COMPARISON:  Chest CT 09/23/2016.  Chest radiograph 09/23/2016 FINDINGS: Ventilation: Patchy uptake of the radiopharmaceutical in the central airways. Perfusion: Patient did not tolerate the procedure well and lateral views could not be obtained. However, perfusion images are essentially normal on the other views. There are no large peripheral perfusion abnormalities. IMPRESSION: Study has technical limitations due to incomplete imaging as described. However, the obtained perfusion images are essentially normal. Very low probability for a pulmonary embolism. Electronically Signed   By: Markus Daft M.D.   On: 09/23/2016 15:50    Microbiology: No results found for this or any previous visit (from the past 240 hour(s)).   Labs: Basic  Metabolic Panel:  Recent Labs Lab 10/02/16 1658 10/04/16 0955  NA 141 140  K 3.7 3.4*  CL 101 99*  CO2 30 31  GLUCOSE 121* 150*  BUN <5* <5*  CREATININE 0.69 0.69  CALCIUM 6.7* 7.3*  MG 1.5* 1.7  PHOS 4.7*  --    Liver Function Tests: No results for input(s): AST, ALT, ALKPHOS, BILITOT, PROT, ALBUMIN in the last 168 hours. No results for input(s): LIPASE, AMYLASE in the last 168 hours. No results for input(s): AMMONIA in the last 168 hours. CBC:  Recent Labs Lab 10/02/16 1658 10/04/16 0955  WBC 14.5* 13.5*  HGB 10.5* 10.4*  HCT 35.4* 35.3*  MCV 87.6 87.4  PLT 395 385   Cardiac Enzymes:  Recent Labs Lab  10/03/16 0325 10/03/16 0926 10/03/16 1518  TROPONINI <0.03 <0.03 <0.03   BNP: BNP (last 3 results)  Recent Labs  08/01/16 1536 09/22/16 2358 10/02/16 1658  BNP 9.7 15.4 31.3    ProBNP (last 3 results) No results for input(s): PROBNP in the last 8760 hours.  CBG:  Recent Labs Lab 10/03/16 1122 10/03/16 1713 10/03/16 2047 10/04/16 0745 10/04/16 1143  GLUCAP 143* 115* 120* 126* 94    Signed:  Velvet Bathe MD.  [facility] 10/04/2016, 1:16 PM

## 2016-10-04 NOTE — Progress Notes (Signed)
Patient discharged. IV removed. Discharge orders provided. Educated on new medication. Patient had no further questions about anything. Discharged via wheelchair.

## 2016-10-31 IMAGING — CR DG CHEST 2V
2 series · 2 of 2 positions shown · non-contrast
Comparison: April 09, 2012.

CLINICAL DATA: Shortness of breath.

EXAM:
CHEST  2 VIEW

[w chest pa]
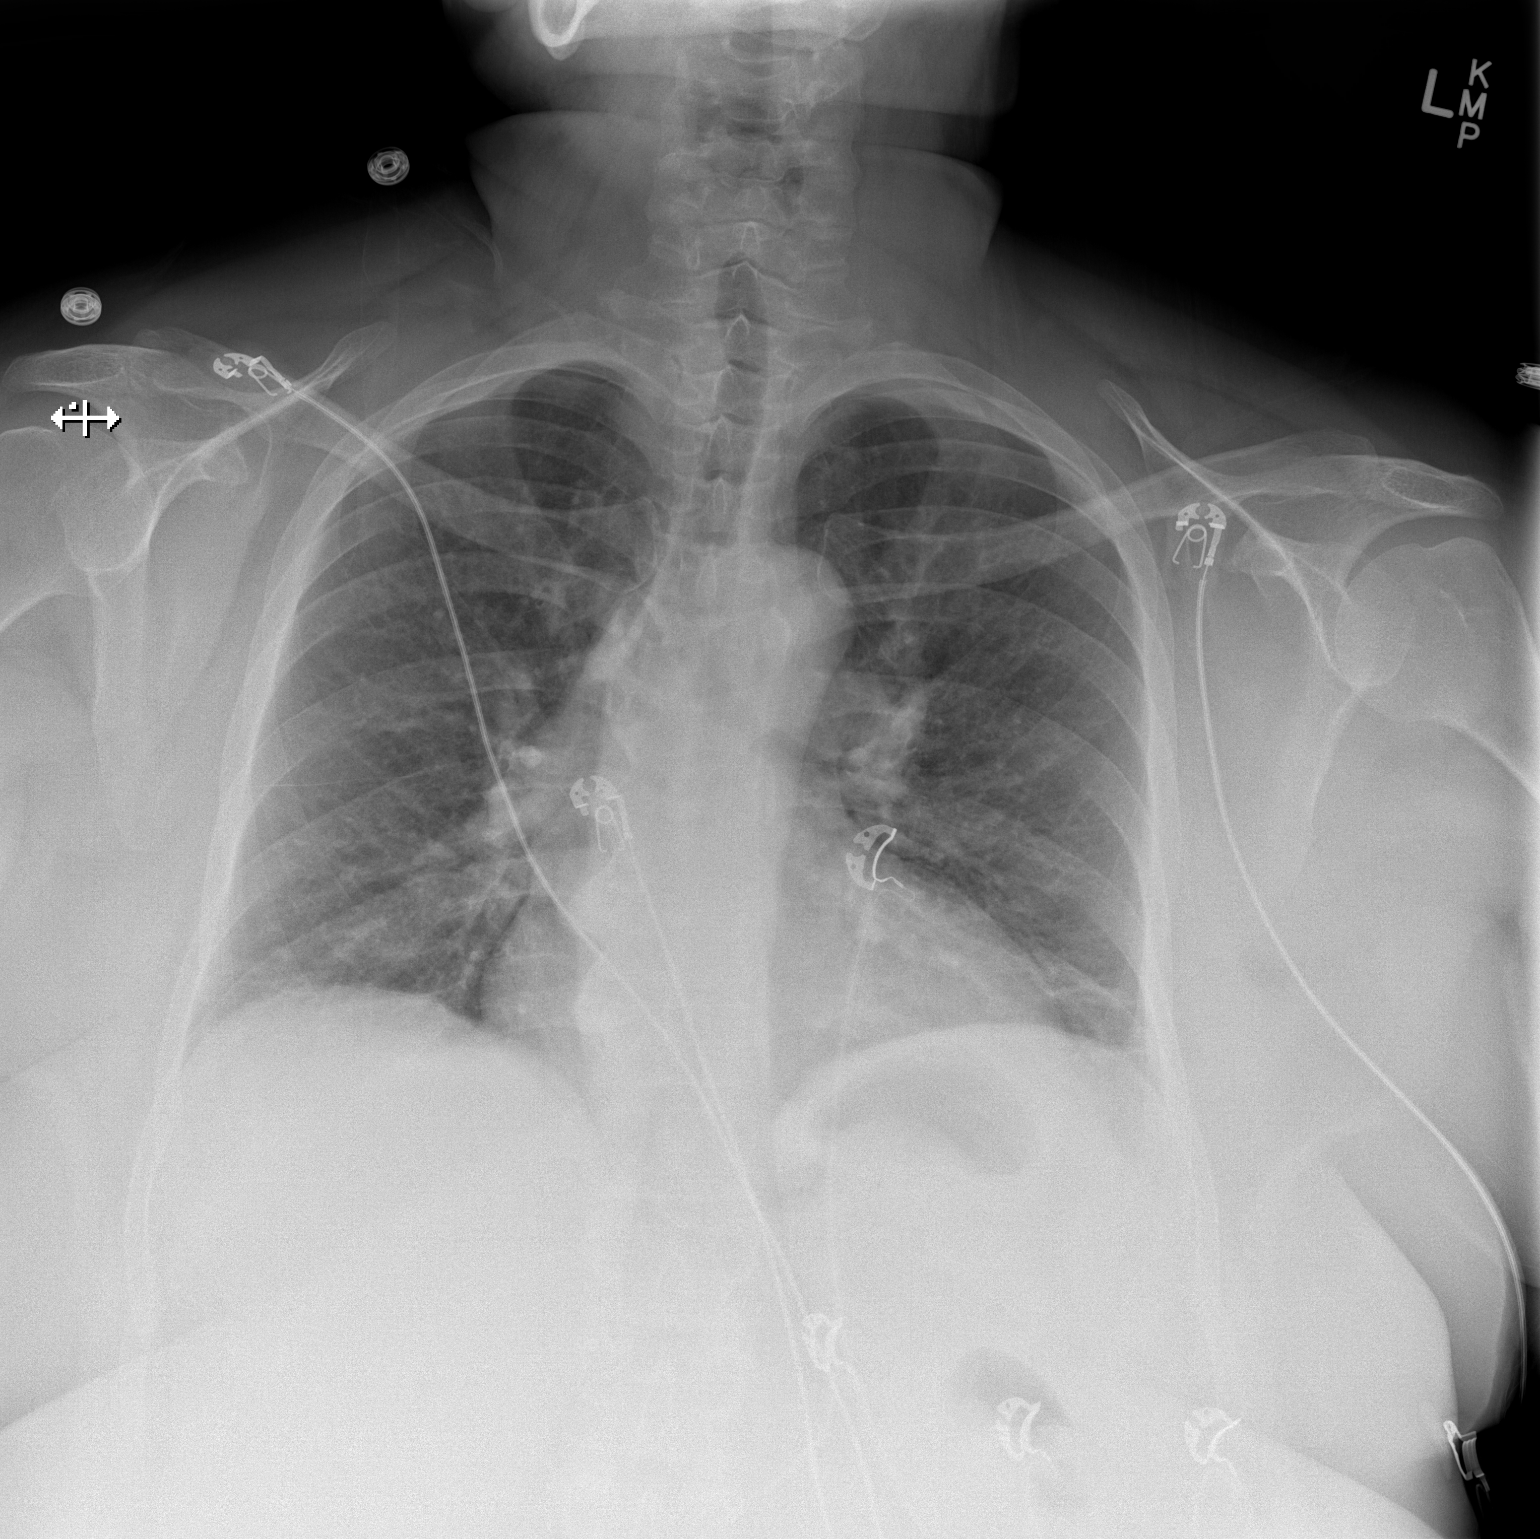

[w chest lat]
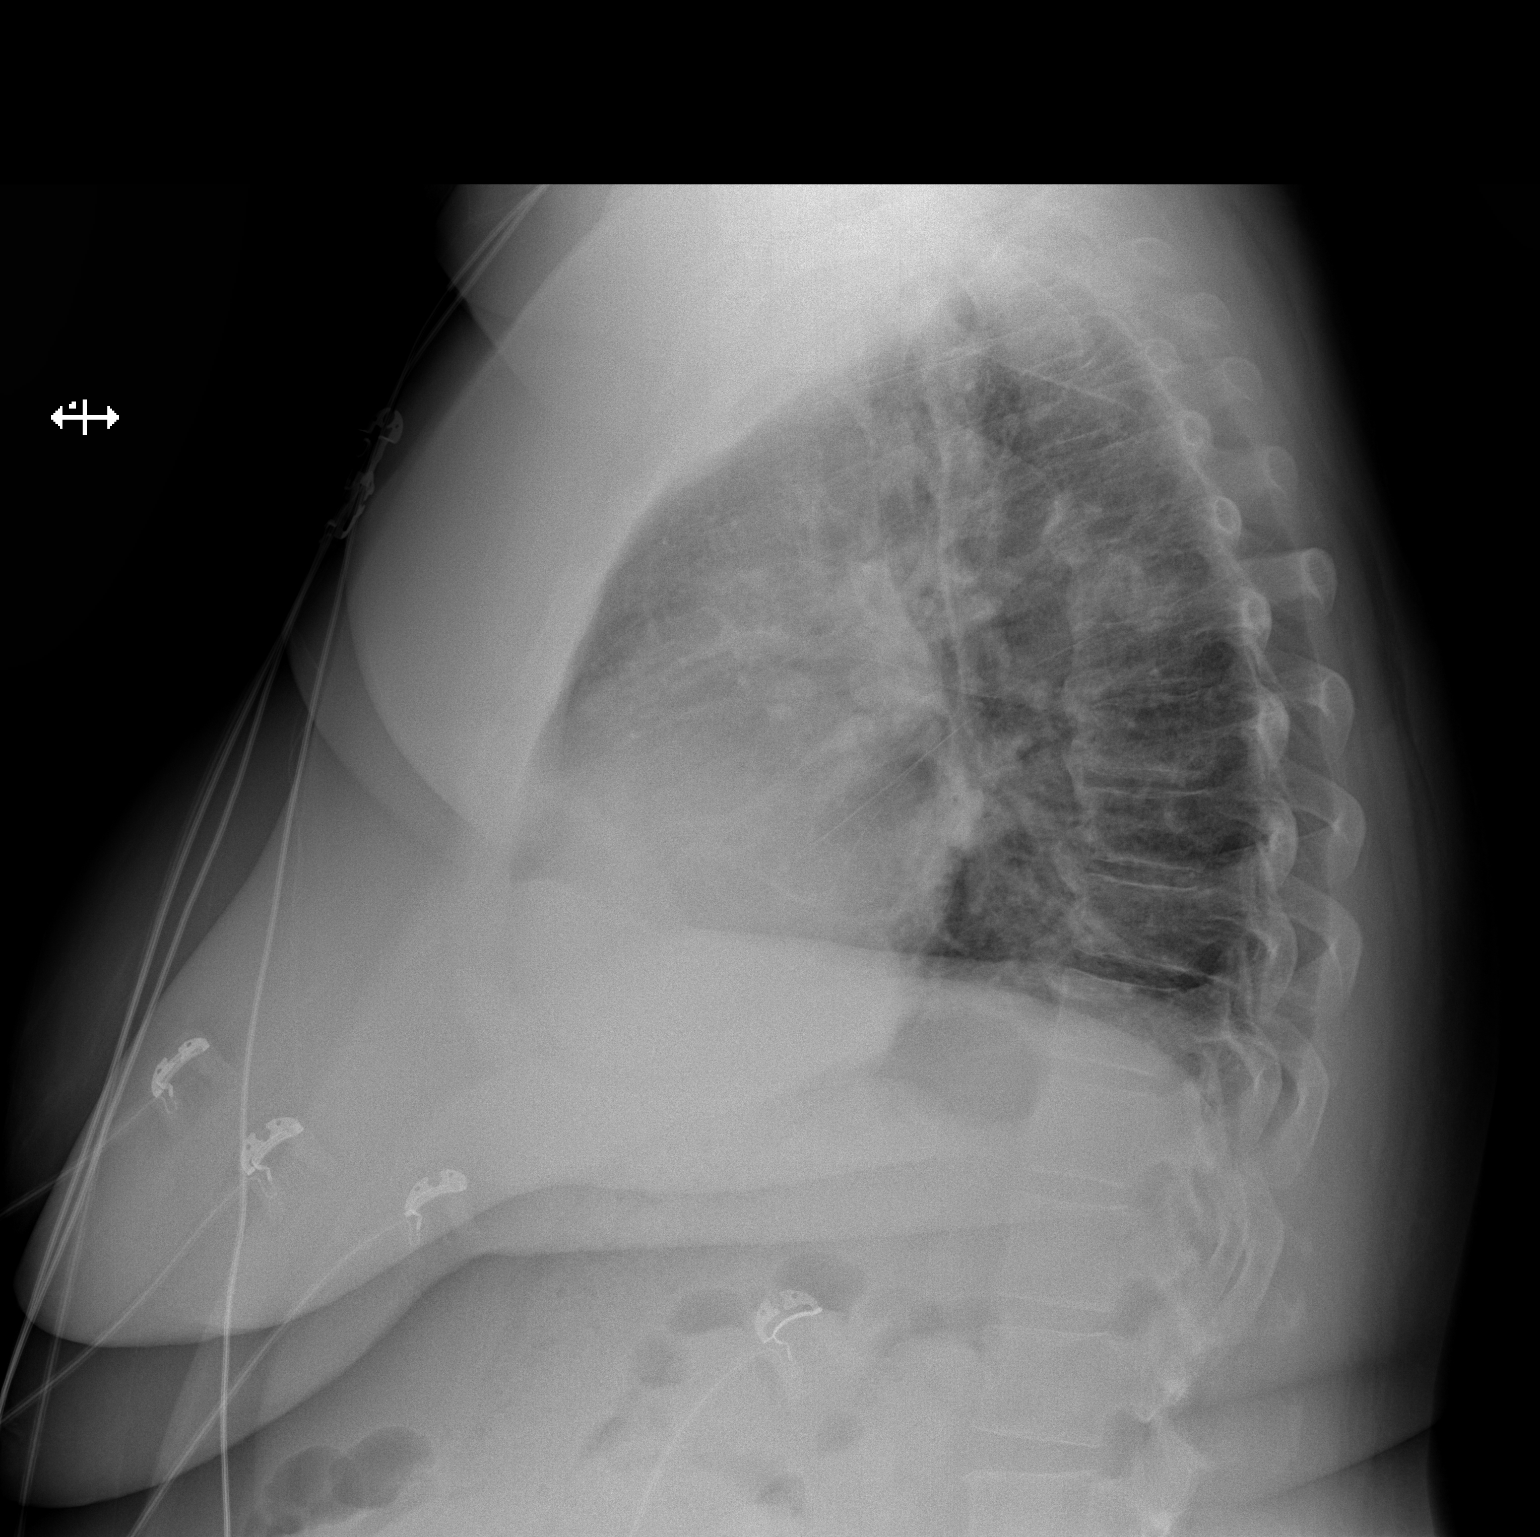

[2 of 2 positions shown; findings below may reference images not displayed]

FINDINGS: The heart size and mediastinal contours are within normal limits.
Right lung is clear. No pneumothorax or pleural effusion is noted.
Minimal left basilar subsegmental atelectasis is noted. The
visualized skeletal structures are unremarkable.
IMPRESSION: Minimal left basilar subsegmental atelectasis.

## 2016-11-01 ENCOUNTER — Ambulatory Visit: Payer: Medicaid Other | Admitting: Acute Care

## 2016-11-21 ENCOUNTER — Ambulatory Visit: Payer: Medicaid Other | Admitting: Podiatry

## 2016-12-12 ENCOUNTER — Other Ambulatory Visit: Payer: Self-pay | Admitting: Internal Medicine

## 2016-12-12 DIAGNOSIS — C73 Malignant neoplasm of thyroid gland: Secondary | ICD-10-CM

## 2016-12-18 ENCOUNTER — Ambulatory Visit
Admission: RE | Admit: 2016-12-18 | Discharge: 2016-12-18 | Disposition: A | Discharge status: Home / Self Care | Payer: Medicaid Other | Source: Ambulatory Visit | Attending: Internal Medicine | Admitting: Internal Medicine

## 2016-12-18 DIAGNOSIS — C73 Malignant neoplasm of thyroid gland: Secondary | ICD-10-CM

## 2016-12-29 ENCOUNTER — Ambulatory Visit
Admission: RE | Admit: 2016-12-29 | Discharge: 2016-12-29 | Disposition: A | Discharge status: Home / Self Care | Payer: Medicaid Other | Source: Ambulatory Visit | Attending: Internal Medicine | Admitting: Internal Medicine

## 2016-12-29 ENCOUNTER — Other Ambulatory Visit: Payer: Self-pay | Admitting: Internal Medicine

## 2016-12-29 DIAGNOSIS — M5489 Other dorsalgia: Secondary | ICD-10-CM

## 2016-12-29 DIAGNOSIS — M6289 Other specified disorders of muscle: Secondary | ICD-10-CM

## 2017-01-03 ENCOUNTER — Other Ambulatory Visit: Payer: Self-pay | Admitting: Internal Medicine

## 2017-01-03 DIAGNOSIS — Z1231 Encounter for screening mammogram for malignant neoplasm of breast: Secondary | ICD-10-CM

## 2017-01-04 ENCOUNTER — Ambulatory Visit (INDEPENDENT_AMBULATORY_CARE_PROVIDER_SITE_OTHER): Payer: Medicaid Other | Admitting: Podiatry

## 2017-01-04 ENCOUNTER — Encounter: Payer: Self-pay | Admitting: Podiatry

## 2017-01-04 DIAGNOSIS — M722 Plantar fascial fibromatosis: Secondary | ICD-10-CM | POA: Diagnosis not present

## 2017-01-04 DIAGNOSIS — E1151 Type 2 diabetes mellitus with diabetic peripheral angiopathy without gangrene: Secondary | ICD-10-CM | POA: Diagnosis not present

## 2017-01-04 DIAGNOSIS — B351 Tinea unguium: Secondary | ICD-10-CM

## 2017-01-04 MED ORDER — DEXAMETHASONE SODIUM PHOSPHATE 120 MG/30ML IJ SOLN
8.0000 mg | Freq: Once | INTRAMUSCULAR | Status: AC
  Administered 2017-01-04: 8 mg via INTRA_ARTICULAR

## 2017-01-04 MED ORDER — TRIAMCINOLONE ACETONIDE 10 MG/ML IJ SUSP
10.0000 mg | Freq: Once | INTRAMUSCULAR | Status: AC
  Administered 2017-01-04: 10 mg

## 2017-01-04 NOTE — Progress Notes (Signed)
   Subjective:    Patient ID: Michele Owens, female    DOB: 1960-05-13, 56 y.o.   MRN: [id number]  HPI 56 year old female returns today for chief complaint of pain in both heels. Patient has seen Dr. Milinda Pointer for this issue. He has received injections in the past with relief, request repeat injections today. Additional complaint of elongated toenails. Patient is diabetic.  Review of Systems     Objective:   Physical Exam There were no vitals filed for this visit. General AA&O x3. Normal mood and affect.  Vascular Dorsalis pedis 1-4 bilateral posterior tibial pulse nonpalpable bilaterally. Capillary refill normal to all digits. Pedal hair growth diminished.  Neurologic Epicritic sensation grossly present.  Dermatologic No open lesions. Interspaces clear of maceration. Nails elongated and dystrophic 10 with yellow discoloration   Orthopedic: MMT 5/5 in dorsiflexion, plantarflexion, inversion, and eversion. Normal joint ROM without pain or crepitus. Palpation of the medial calcaneal tubercles bilaterally      Assessment & Plan:  Plantar Fasciitis, bilaterally - Injection delivered to bilateral plantar fasciitis below.  Procedure: Injection Tendon/Ligament Location: Bilateral plantar fascia at the glabrous junction; medial approach. Skin Prep: Alcohol. Injectate: 1 cc 0.5% marcaine plain, 1 cc dexamethasone phosphate, 0.5 cc kenalog 10. Disposition: Patient tolerated procedure well. Injection site dressed with a band-aid.  Diabetes with PAD, Onychomycosis -Educated on diabetic footcare. Diabetic risk level 1 -Nails x10 debrided sharply and manually with large nail nipper and rotary burr.  Procedure: Nail Debridement Rationale: Patient meets criteria for routine foot care due to class B findings Type of Debridement: manual, sharp debridement. Instrumentation: Nail nipper, rotary burr. Number of Nails: 10  Return if symptoms worsen or fail to improve.

## 2017-01-06 ENCOUNTER — Other Ambulatory Visit: Payer: Self-pay

## 2017-01-06 ENCOUNTER — Encounter (HOSPITAL_COMMUNITY): Payer: Self-pay | Admitting: *Deleted

## 2017-01-06 ENCOUNTER — Emergency Department (HOSPITAL_COMMUNITY): Payer: Medicaid Other

## 2017-01-06 ENCOUNTER — Emergency Department (HOSPITAL_COMMUNITY)
Admission: EM | Admit: 2017-01-06 | Discharge: 2017-01-06 | Disposition: A | Discharge status: Home / Self Care | Payer: Medicaid Other | Attending: Emergency Medicine | Admitting: Emergency Medicine

## 2017-01-06 DIAGNOSIS — Z9089 Acquired absence of other organs: Secondary | ICD-10-CM | POA: Diagnosis not present

## 2017-01-06 DIAGNOSIS — Z85850 Personal history of malignant neoplasm of thyroid: Secondary | ICD-10-CM | POA: Diagnosis not present

## 2017-01-06 DIAGNOSIS — I110 Hypertensive heart disease with heart failure: Secondary | ICD-10-CM | POA: Insufficient documentation

## 2017-01-06 DIAGNOSIS — I5042 Chronic combined systolic (congestive) and diastolic (congestive) heart failure: Secondary | ICD-10-CM | POA: Diagnosis not present

## 2017-01-06 DIAGNOSIS — R0602 Shortness of breath: Secondary | ICD-10-CM | POA: Insufficient documentation

## 2017-01-06 DIAGNOSIS — Z79899 Other long term (current) drug therapy: Secondary | ICD-10-CM | POA: Insufficient documentation

## 2017-01-06 DIAGNOSIS — Z9981 Dependence on supplemental oxygen: Secondary | ICD-10-CM | POA: Diagnosis not present

## 2017-01-06 DIAGNOSIS — J45909 Unspecified asthma, uncomplicated: Secondary | ICD-10-CM | POA: Insufficient documentation

## 2017-01-06 DIAGNOSIS — Z7984 Long term (current) use of oral hypoglycemic drugs: Secondary | ICD-10-CM | POA: Insufficient documentation

## 2017-01-06 DIAGNOSIS — E119 Type 2 diabetes mellitus without complications: Secondary | ICD-10-CM | POA: Insufficient documentation

## 2017-01-06 DIAGNOSIS — J449 Chronic obstructive pulmonary disease, unspecified: Secondary | ICD-10-CM | POA: Insufficient documentation

## 2017-01-06 DIAGNOSIS — Z7902 Long term (current) use of antithrombotics/antiplatelets: Secondary | ICD-10-CM | POA: Diagnosis not present

## 2017-01-06 DIAGNOSIS — Z87891 Personal history of nicotine dependence: Secondary | ICD-10-CM | POA: Diagnosis not present

## 2017-01-06 LAB — POCT I-STAT, CHEM 8
BUN: 13 mg/dL (ref 6–20)
CALCIUM ION: 1.06 mmol/L — AB (ref 1.15–1.40)
CHLORIDE: 97 mmol/L — AB (ref 101–111)
CREATININE: 0.6 mg/dL (ref 0.44–1.00)
Glucose, Bld: 99 mg/dL (ref 65–99)
HEMATOCRIT: 38 % (ref 36.0–46.0)
Hemoglobin: 12.9 g/dL (ref 12.0–15.0)
Potassium: 3.8 mmol/L (ref 3.5–5.1)
Sodium: 143 mmol/L (ref 135–145)
TCO2: 34 mmol/L — AB (ref 22–32)

## 2017-01-06 LAB — CBC
HEMATOCRIT: 37.5 % (ref 36.0–46.0)
HEMOGLOBIN: 11.4 g/dL — AB (ref 12.0–15.0)
MCH: 29 pg (ref 26.0–34.0)
MCHC: 30.4 g/dL (ref 30.0–36.0)
MCV: 95.4 fL (ref 78.0–100.0)
Platelets: 284 10*3/uL (ref 150–400)
RBC: 3.93 MIL/uL (ref 3.87–5.11)
RDW: 16.1 % — ABNORMAL HIGH (ref 11.5–15.5)
WBC: 16.7 10*3/uL — ABNORMAL HIGH (ref 4.0–10.5)

## 2017-01-06 LAB — GLUCOSE, CAPILLARY: Glucose-Capillary: 84 mg/dL (ref 65–99)

## 2017-01-06 LAB — POCT I-STAT TROPONIN I: Troponin i, poc: 0 ng/mL (ref 0.00–0.08)

## 2017-01-06 MED ORDER — ALBUTEROL SULFATE HFA 108 (90 BASE) MCG/ACT IN AERS
2.0000 | INHALATION_SPRAY | Freq: Once | RESPIRATORY_TRACT | Status: AC
  Administered 2017-01-06: 2 via RESPIRATORY_TRACT
  Filled 2017-01-06: qty 6.7

## 2017-01-06 MED ORDER — CARVEDILOL 6.25 MG PO TABS
6.2500 mg | ORAL_TABLET | Freq: Two times a day (BID) | ORAL | Status: DC
  Administered 2017-01-06: 6.25 mg via ORAL
  Filled 2017-01-06 (×2): qty 1

## 2017-01-06 MED ORDER — METFORMIN HCL 500 MG PO TABS
500.0000 mg | ORAL_TABLET | Freq: Two times a day (BID) | ORAL | Status: DC
  Administered 2017-01-06: 500 mg via ORAL
  Filled 2017-01-06: qty 1

## 2017-01-06 MED ORDER — DEXAMETHASONE SODIUM PHOSPHATE 10 MG/ML IJ SOLN
10.0000 mg | Freq: Once | INTRAMUSCULAR | Status: AC
  Administered 2017-01-06: 10 mg via INTRAVENOUS
  Filled 2017-01-06: qty 1

## 2017-01-06 MED ORDER — FUROSEMIDE 40 MG PO TABS
40.0000 mg | ORAL_TABLET | Freq: Two times a day (BID) | ORAL | Status: DC
  Administered 2017-01-06: 40 mg via ORAL
  Filled 2017-01-06: qty 1

## 2017-01-06 MED ORDER — ALBUTEROL SULFATE (2.5 MG/3ML) 0.083% IN NEBU
5.0000 mg | INHALATION_SOLUTION | Freq: Once | RESPIRATORY_TRACT | Status: AC
  Administered 2017-01-06: 5 mg via RESPIRATORY_TRACT
  Filled 2017-01-06: qty 6

## 2017-01-06 MED ORDER — DOXYCYCLINE HYCLATE 100 MG PO CAPS: 100.0000 mg | ORAL_CAPSULE | Freq: Two times a day (BID) | ORAL | 0 refills | Status: DC

## 2017-01-06 MED ORDER — LISINOPRIL 5 MG PO TABS
5.0000 mg | ORAL_TABLET | Freq: Every day | ORAL | Status: DC
  Administered 2017-01-06: 5 mg via ORAL
  Filled 2017-01-06: qty 1

## 2017-01-06 MED ORDER — DOXYCYCLINE HYCLATE 100 MG PO CAPS: 100.0000 mg | ORAL_CAPSULE | Freq: Two times a day (BID) | ORAL | 0 refills | Status: AC

## 2017-01-06 MED ORDER — IPRATROPIUM BROMIDE 0.02 % IN SOLN
0.5000 mg | Freq: Once | RESPIRATORY_TRACT | Status: AC
  Administered 2017-01-06: 0.5 mg via RESPIRATORY_TRACT
  Filled 2017-01-06: qty 2.5

## 2017-01-06 MED ORDER — LEVOTHYROXINE SODIUM 100 MCG PO TABS
100.0000 ug | ORAL_TABLET | Freq: Every day | ORAL | Status: DC
  Administered 2017-01-06: 100 ug via ORAL
  Filled 2017-01-06: qty 1

## 2017-01-06 NOTE — ED Provider Notes (Signed)
[place] DEPT Provider Note   CSN: [id number] Arrival date & time: 01/06/17  1455     History   Chief Complaint Chief Complaint  Patient presents with  . Shortness of Breath  . Cough    HPI Michele Owens is a 56 y.o. female.  HPI   Patient is a 56 year old female with a history of CHF, COPD (on 2L home O2), morbid obesity and thyroid cancer presenting via EMS for shortness of breath and "not feeling well" x1 week. Patient reports he has been coughing nonproductive of sputum. Patient denies hemoptysis. Patient reports she has had some rhinorrhea, but no congestion or sore throat. Patient reports she has right-sided chest pain only with palpation of the right thorax. This is non-exertional. Patient denies any increasing lower extremity edema. Patient denies any fever, chills, nausea, vomiting, increasing lower extremity edema, or calf pain. Patient reports that she is frequently dizzy and lightheaded after taking her hydrocodone. Patient denies any change in these sensations from baseline. Patient has been taking all usual medications and inhalers is not out any prescriptions. The patient does report that her nebulizer machine has broken and she has had no access to it.   Past Medical History:  Diagnosis Date  . Anxiety   . Arthritis   . Asthma   . Chronic diastolic CHF (congestive heart failure) ([place])   . COPD (chronic obstructive pulmonary disease) (HCC)    Uses Oxygen at night  . Depression   . Diabetes mellitus without complication ([place])   . Dyspnea    occ  . GERD (gastroesophageal reflux disease)   . Gout   . Headache    migraines  . History of DVT of lower extremity   . History of nuclear stress test    Myoview 2/17:  Low risk stress nuclear study with a small, moderate intensity, partially reversible inferior lateral defect consistent with small prior infarct and minimal peri-infarct ischemia; EF 68 with normal wall motion  . Hypertension   . Pneumonia   .  Thyroid cancer ([place]) 09/23/2016    Patient Active Problem List   Diagnosis Date Noted  . Hypomagnesemia 10/03/2016  . Boil, breast 10/03/2016  . Atypical chest pain   . HLD (hyperlipidemia) 09/23/2016  . COPD (chronic obstructive pulmonary disease) ([place]) 09/23/2016  . Gout 09/23/2016  . DVT (deep vein thrombosis) in pregnancy ([place]) 09/23/2016  . Thyroid cancer ([place]) 09/23/2016  . Hypocalcemia 09/23/2016  . AKI (acute kidney injury) ([place]) 09/23/2016  . 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 Acute pulmonary edema (HCC)   . Acute hypoxemic respiratory failure ([place])   . Acute on chronic respiratory failure with hypoxemia ([place])   . Ventilator dependent ([place])   . Respiratory failure ([place]) 07/16/2016  . Chronic combined systolic (congestive) and diastolic (congestive) heart failure ([place]) 02/10/2016  . Dyspnea   . CAP (community acquired pneumonia) 01/23/2016  . 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 Pulmonary hypertension ([place]) 01/23/2016  . Sepsis ([place]) 01/23/2016  . Obesity hypoventilation syndrome ([place]) 08/26/2015  . Chest pain 06/16/2015  . Dyslipidemia associated with type 2 diabetes mellitus ([place]) 04/24/2015  . Leukocytosis 04/24/2015  . Controlled type 2 diabetes mellitus without complication, without long-term current use of insulin ([place]) 04/24/2015  . Anxiety 04/24/2015  . Benign essential HTN 04/24/2015  . Normocytic anemia 04/24/2015  . Asthma with COPD with exacerbation ([place]) 04/24/2015  . OSA (obstructive sleep apnea) 05/10/2012  . Tobacco abuse 07/04/2011  Past Surgical History:  Procedure Laterality Date  . BUNIONECTOMY Bilateral   . CARDIAC CATHETERIZATION N/A 06/23/2015   Procedure: Right/Left Heart Cath and Coronary Angiography;  Surgeon: [name], MD;  Location: [facility];  Service: Cardiovascular;  Laterality: N/A;  . COLONOSCOPY WITH PROPOFOL N/A 12/15/2015   Procedure: COLONOSCOPY WITH  PROPOFOL;  Surgeon: [name], MD;  Location: [facility];  Service: Endoscopy;  Laterality: N/A;  . THYROIDECTOMY  09/19/2016  . THYROIDECTOMY N/A 09/19/2016   Procedure: TOTAL THYROIDECTOMY;  Surgeon: [name], MD;  Location: [facility];  Service: General;  Laterality: N/A;  . TONSILLECTOMY    . TOTAL ABDOMINAL HYSTERECTOMY  07/14/10    OB History    No data available       Home Medications    Prior to Admission medications   Medication Sig Start Date End Date Taking? Authorizing Provider  acetaminophen (TYLENOL) 325 MG tablet Take 1 tablet (325 mg total) by mouth every 6 (six) hours as needed for mild pain or headache. Caution: Acetaminophen from all sources not to exceed greater than 3 g per day. 10/04/16   Velvet Bathe, MD  albuterol (ACCUNEB) 1.25 MG/3ML nebulizer solution Take 1 ampule by nebulization 2 (two) times daily.    [provider]  albuterol (PROVENTIL HFA;VENTOLIN HFA) 108 (90 BASE) MCG/ACT inhaler Inhale 1-2 puffs into the lungs every 6 (six) hours as needed for wheezing. 04/09/12   Billy Fischer, MD  allopurinol (ZYLOPRIM) 100 MG tablet Take 100 mg by mouth daily.    [provider]  benzonatate (TESSALON) 200 MG capsule Take 1 capsule (200 mg total) by mouth 3 (three) times daily as needed for cough. 07/24/16   Lavina Hamman, MD  budesonide-formoterol [facility]) 80-4.5 MCG/ACT inhaler Inhale 2 puffs into the lungs 2 (two) times daily. 07/24/16   Lavina Hamman, MD  calcitRIOL (ROCALTROL) 0.5 MCG capsule Take 1 capsule (0.5 mcg total) by mouth daily. 09/26/16   Hongalgi, [name], MD  calcium carbonate (TUMS) 500 MG chewable tablet Chew 4 tablets (800 mg of elemental calcium total) by mouth 4 (four) times daily. 09/25/16   Hongalgi, [name], MD  carvedilol (COREG) 6.25 MG tablet TAKE ONE TABLET BY MOUTH TWICE DAILY WITH A MEAL Patient taking differently: TAKE 6.25 MG BY MOUTH TWICE DAILY WITH A MEAL 08/22/16   Bensimhon, [name], MD  clonazePAM  (KLONOPIN) 2 MG tablet Take 1 tablet (2 mg total) by mouth 2 (two) times daily. 01/27/16   Mikhail, Velta Addison, DO  colchicine 0.6 MG tablet Take 0.6 mg by mouth 2 (two) times daily.    [provider]  diclofenac sodium (VOLTAREN) 1 % GEL Apply 2 g topically 4 (four) times daily as needed (for pain).     [provider]  dicyclomine (BENTYL) 20 MG tablet Take 1 tablet (20 mg total) by mouth 2 (two) times daily as needed (abdominal pain/cramping). 11/28/15   Antonietta Breach, PA-C  EPINEPHrine 0.3 mg/0.3 mL IJ SOAJ injection Inject 0.3 mg into the muscle daily as needed (allergic reaction).    [provider]  furosemide (LASIX) 40 MG tablet Take 1 tablet (40 mg total) by mouth daily. Patient taking differently: Take 40 mg by mouth 2 (two) times daily.  09/25/16   Hongalgi, [name], MD  HYDROcodone-acetaminophen (NORCO/VICODIN) 5-325 MG tablet Take 1-2 tablets by mouth every 4 (four) hours as needed for moderate pain. 09/20/16   [name], MD  lisinopril (PRINIVIL,ZESTRIL) 5 MG tablet  Take 1 tablet (5 mg total) by mouth daily. 08/01/16   [name], MD  loratadine (CLARITIN) 10 MG tablet Take 10 mg by mouth daily as needed for allergies.     [provider]  metFORMIN (GLUCOPHAGE) 500 MG tablet Take 1 tablet (500 mg total) by mouth 2 (two) times daily with a meal. 12/11/14   [name], MD  mirtazapine (REMERON) 15 MG tablet Take 15 mg by mouth at bedtime. 11/16/14   [provider]  mometasone-formoterol (DULERA) 100-5 MCG/ACT AERO Inhale 2 puffs into the lungs 2 (two) times daily.    [provider]  nicotine (NICODERM CQ - DOSED IN MG/24 HOURS) 14 mg/24hr patch Place 1 patch (14 mg total) onto the skin daily. 07/25/16   Lavina Hamman, MD  omeprazole (PRILOSEC) 40 MG capsule Take 40 mg by mouth daily. 04/13/15   [provider]  potassium chloride SA (K-DUR,KLOR-CON) 20 MEQ tablet Take 2 tablets (40 mEq total) by mouth daily. 08/01/16    [name], MD  pravastatin (PRAVACHOL) 40 MG tablet Take 40 mg by mouth every evening.  03/29/15   [provider]  SYNTHROID 100 MCG tablet Take 1 tablet (100 mcg total) by mouth daily. 09/20/16   [name], MD  tiotropium (SPIRIVA) 18 MCG inhalation capsule Place 18 mcg into inhaler and inhale daily.    [provider]    Family History Family History  Problem Relation Age of Onset  . Cancer Father   . Diabetes Mother     Social History Social History  Substance Use Topics  . Smoking status: Former Smoker    Packs/day: 0.50    Years: 41.00    Types: Cigarettes    Quit date: 07/2016  . Smokeless tobacco: Never Used     Comment: Starting to [employer] -- using Nicotine Patch  . Alcohol use 0.0 oz/week     Comment: ? none now     Allergies   Bee venom; Ibuprofen; Lamisil [terbinafine hcl]; and Nsaids   Review of Systems Review of Systems  Constitutional: Negative for chills and fever.  HENT: Positive for rhinorrhea. Negative for congestion and sore throat.   Eyes: Negative for visual disturbance.  Respiratory: Negative for cough, chest tightness and shortness of breath.   Cardiovascular: Positive for chest pain. Negative for palpitations and leg swelling.  Gastrointestinal: Negative for abdominal pain, blood in stool, constipation, diarrhea, nausea and vomiting.  Genitourinary: Negative for dysuria.  Musculoskeletal: Positive for arthralgias.  Skin: Negative for rash.  Neurological: Negative for dizziness, light-headedness and headaches.     Physical Exam Updated Vital Signs BP 127/67 (BP Location: Left Arm)   Pulse 79   Temp 97.9 F (36.6 C) (Oral)   Resp 20   SpO2 94%   Physical Exam  Constitutional: She appears well-developed and well-nourished. No distress.  HENT:  Head: Normocephalic and atraumatic.  Mouth/Throat: Oropharynx is clear and moist.  No erythema of posterior pharynx.  Eyes: Pupils are equal, round, and reactive to  light. Conjunctivae and EOM are normal.  Neck: Normal range of motion. Neck supple.  Cardiovascular: Normal rate, regular rhythm, S1 normal, S2 normal and intact distal pulses.   Third heart sound present. No LE edema.  Pulmonary/Chest: Effort normal and breath sounds normal. She has no wheezes. She exhibits tenderness.  Soft crackles in b/l lung bases. Tenderness at the right thorax to palpation.  Abdominal: Soft. She exhibits no distension. There is no tenderness. There  is no guarding.  Musculoskeletal: Normal range of motion. She exhibits no edema or deformity.  No tenderness of b/l calves.   Lymphadenopathy:    She has no cervical adenopathy.  Neurological: She is alert.  Cranial nerves grossly intact. Patient moves extremities with good coordination and without difficulty.  Skin: Skin is warm and dry. No rash noted. No erythema.  Psychiatric: She has a normal mood and affect. Her behavior is normal. Judgment and thought content normal.  Nursing note and vitals reviewed.    ED Treatments / Results  Labs (all labs ordered are listed, but only abnormal results are displayed) Labs Reviewed  GLUCOSE, CAPILLARY    EKG  EKG Interpretation None       Radiology Dg Chest 2 View  Result Date: 01/06/2017 CLINICAL DATA:  Shortness of breath.  Cough. EXAM: CHEST  2 VIEW COMPARISON:  Radiographs of October 02, 2016. FINDINGS: Stable cardiomegaly. No pneumothorax is noted. Mild bibasilar subsegmental atelectasis or infiltrates are noted. No significant pleural effusion is noted. Bony thorax is unremarkable. IMPRESSION: Mild bibasilar subsegmental atelectasis or infiltrates. Electronically Signed   By: Marijo Conception, M.D.   On: 01/06/2017 16:14    Procedures Procedures (including critical care time)  Medications Ordered in ED Medications  albuterol (PROVENTIL) (2.5 MG/3ML) 0.083% nebulizer solution 5 mg (not administered)     Initial Impression / Assessment and Plan / ED Course    I have reviewed the triage vital signs and the nursing notes.  Pertinent labs & imaging results that were available during my care of the patient were reviewed by me and considered in my medical decision making (see chart for details).  Clinical Course as of Jan 07 1732  Sat Jan 06, 2017  1700 Patient seen and evaluated. Patient overall well-appearing. CBG checked in room and 84. Encouraged protein snack to prevent further drop in blood sugar.  [AM]    Clinical Course User Index [AM] Albesa Seen, PA-C    Final Clinical Impressions(s) / ED Diagnoses   Final diagnoses:  None   MDM   Differential diagnosis evaluated today include upper respiratory infection, pneumonia, CHF exacerbation, COPD exacerbation, ACS, pulmonary embolism, anemia. Wells score 2.5 (hx DVT, thyroid cancer removed in 09/2016). Do not suspect pulmonary embolus at this time due to length of time of symptoms and chest pain very reproducible on palpation. This was discussed with Dr. Gareth Morgan, and patient has much more likely diagnoses. Chest x-ray shows no infiltrate. Patient fluid status determined to be nonelevated on clinical exam and doubt CHF exacerbation. Lung exam consistent with atelectasis and minimal wheeze noted. Labs reviewed and show no abnormalities except hyopcalcemia, which is chronic for patient. EKG shows some nonspecific ST-T wave changes in anterior leads, but is consistent with prior. Troponin negative. Therefore doubt ACS. Suspect possible upper respiratory infection with development of mild COPD exacerbation. Patient is satting well on home O2 and is stable at this time for discharge. Will discharge with extra albuterol inhaler and prescription for doxycycline. Patient is in understanding and agrees with the plan of care.  New Prescriptions New Prescriptions   No medications on file     [name] 01/07/17 0202    Gareth Morgan, MD 01/07/17 1236

## 2017-01-06 NOTE — ED Triage Notes (Addendum)
EMS reports pt called due to shob, increased SHOB after smoking, cough with clear secretions for last month. Pt on home 02 2l. EKG obtained.

## 2017-01-06 NOTE — ED Notes (Signed)
Bed: [facility] Expected date:  Expected time:  Means of arrival:  Comments: 

## 2017-01-06 NOTE — Discharge Instructions (Signed)
Please see the information and instructions below regarding your visit.  Your diagnoses today include:  1. Shortness of breath    Your symptoms may be caused by a mild respiratory infection that could be affecting your COPD. To prevent any pneumonia, we would like to treat you.  Tests performed today include: See side panel of your discharge paperwork for testing performed today. Vital signs are listed at the bottom of these instructions.  -EKG-shows no evidence of heart attack -Heart enzyme-normal -Blood counts, electrolytes-normal, except for low calcium, which you have had in the past.  Medications prescribed:    Take any prescribed medications only as prescribed, and any over the counter medications only as directed on the packaging.  1. Doxycycline. Please take all of your antibiotics until finished.   You may develop abdominal discomfort or nausea from the antibiotic. If this occurs, you may take it with food. Some patients also get diarrhea with antibiotics. You may help offset this with probiotics which you can buy or get in yogurt. Do not eat or take the probiotics until 2 hours after your antibiotic. Some women develop vaginal yeast infections after antibiotics. If you develop unusual vaginal discharge after being on this medication, please see your primary care provider.   Some people develop allergies to antibiotics. Symptoms of antibiotic allergy can be mild and include a flat rash and itching. They can also be more serious and include:  ?Hives - Hives are raised, red patches of skin that are usually very itchy.  ?Lip or tongue swelling  ?Trouble swallowing or breathing  ?Blistering of the skin or mouth.  If you have any of these serious symptoms, please seek emergency medical care immediately.   Home care instructions:  Please follow any educational materials contained in this packet.   Follow-up instructions: Please follow-up with your primary care provider in 1  week for further evaluation of your symptoms if they are not completely improved.   Return instructions:  Please return to the Emergency Department if you experience worsening symptoms.  Please return to the emergency department for any shortness of breath that is worsening, occurs when you are doing activity, any shortness of breath at rest or with activity that is worsening, fever or chills, coughing up blood or sputum, or worsening swelling in your legs. Please return if you have any other emergent concerns.  Additional Information:   Your vital signs today were: BP 127/67 (BP Location: Left Arm)    Pulse 79    Temp 97.9 F (36.6 C) (Oral)    Resp 20    SpO2 94%  If your blood pressure (BP) was elevated on multiple readings during this visit above 130 for the top number or above 80 for the bottom number, please have this repeated by your primary care provider within one month. --------------  Thank you for allowing [place] to participate in your care today. It was a pleasure taking care of you today!

## 2017-01-09 ENCOUNTER — Other Ambulatory Visit (HOSPITAL_COMMUNITY): Payer: Self-pay | Admitting: Cardiology

## 2017-01-10 ENCOUNTER — Ambulatory Visit
Admission: RE | Admit: 2017-01-10 | Discharge: 2017-01-10 | Disposition: A | Discharge status: Home / Self Care | Payer: Medicaid Other | Source: Ambulatory Visit | Attending: Internal Medicine | Admitting: Internal Medicine

## 2017-01-10 ENCOUNTER — Encounter: Payer: Self-pay | Admitting: Genetic Counselor

## 2017-01-10 DIAGNOSIS — Z1231 Encounter for screening mammogram for malignant neoplasm of breast: Secondary | ICD-10-CM

## 2017-01-19 ENCOUNTER — Encounter: Payer: Self-pay | Admitting: Genetic Counselor

## 2017-01-19 ENCOUNTER — Telehealth: Payer: Self-pay | Admitting: Genetic Counselor

## 2017-01-19 NOTE — Telephone Encounter (Signed)
Received a call from Almyra Free at Dr. Cindra Eves office to reschedule the pt's genetic counseling appt. Appt has been rescheduled to 11/12 at 2pm. New letter mailed.

## 2017-01-25 ENCOUNTER — Other Ambulatory Visit: Payer: Medicaid Other

## 2017-02-19 ENCOUNTER — Encounter: Payer: Medicaid Other | Admitting: Genetic Counselor

## 2017-02-19 ENCOUNTER — Other Ambulatory Visit: Payer: Medicaid Other

## 2017-02-20 ENCOUNTER — Encounter: Payer: Self-pay | Admitting: Genetic Counselor

## 2017-02-20 ENCOUNTER — Ambulatory Visit (HOSPITAL_BASED_OUTPATIENT_CLINIC_OR_DEPARTMENT_OTHER): Payer: Medicaid Other | Admitting: Genetic Counselor

## 2017-02-20 ENCOUNTER — Other Ambulatory Visit: Payer: Medicaid Other

## 2017-02-20 DIAGNOSIS — Z808 Family history of malignant neoplasm of other organs or systems: Secondary | ICD-10-CM | POA: Diagnosis not present

## 2017-02-20 DIAGNOSIS — C73 Malignant neoplasm of thyroid gland: Secondary | ICD-10-CM | POA: Diagnosis not present

## 2017-02-20 NOTE — Progress Notes (Signed)
REFERRING PROVIDER: Delrae Rend, MD [address] [place] [employer], [place] [address]  PRIMARY PROVIDER:  [name], MD  PRIMARY REASON FOR VISIT:  1. Thyroid cancer ([place])   2. Family history of thyroid cancer      HISTORY OF PRESENT ILLNESS:   Michele Owens, a 56 y.o. female, was seen for a [place] cancer genetics consultation at the request of Dr. Buddy Duty due to a personal and family history of cancer.  Michele Owens presents to clinic today to discuss the possibility of a hereditary predisposition to cancer, genetic testing, and to further clarify her future cancer risks, as well as potential cancer risks for family members.   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 This was treated with thyroidectomy.  The pathology reports that the [place] is unifocal.  She is currently on several medications including synthroid and Calcitonin.      CANCER HISTORY:   No history exists.     HORMONAL RISK FACTORS:  Menarche was at age 17-13.  First live birth at age 35.  OCP use for approximately 0 years.  Ovaries intact: yes.  Hysterectomy: yes.  Menopausal status: postmenopausal.  HRT use: 0 years. Colonoscopy: yes; has had three tubular adenomas. Mammogram within the last year: yes. Number of breast biopsies: 0. Up to date with pelvic exams:  yes. Any excessive radiation exposure in the past:  no  Past Medical History:  Diagnosis Date  . Anxiety   . Arthritis   . Asthma   . Chronic diastolic CHF (congestive heart failure) ([place])   . COPD (chronic obstructive pulmonary disease) ([facility])    Uses Oxygen at night  . Depression   . Diabetes mellitus without complication ([place])   . Dyspnea    occ  . Family history of thyroid cancer   . GERD (gastroesophageal reflux disease)   . Gout   . Headache    migraines  . History of DVT of lower extremity   . History of nuclear stress test    Myoview 2/17:  Low risk stress  nuclear study with a small, moderate intensity, partially reversible inferior lateral defect consistent with small prior infarct and minimal peri-infarct ischemia; EF 68 with normal wall motion  . Hypertension   . Pneumonia   . Thyroid cancer ([place]) 09/23/2016    Past Surgical History:  Procedure Laterality Date  . BUNIONECTOMY Bilateral   . THYROIDECTOMY  09/19/2016  . TONSILLECTOMY    . TOTAL ABDOMINAL HYSTERECTOMY  07/14/10    Social History   Socioeconomic History  . Marital status: Single    Spouse name: Not on file  . Number of children: Not on file  . Years of education: Not on file  . Highest education level: Not on file  Social Needs  . Financial resource strain: Not on file  . Food insecurity - worry: Not on file  . Food insecurity - inability: Not on file  . Transportation needs - medical: Not on file  . Transportation needs - non-medical: Not on file  Occupational History  . Occupation: unemployeed  Tobacco Use  . Smoking status: Former Smoker    Packs/day: 0.50    Years: 41.00    Pack years: 20.50    Types: Cigarettes    Last attempt to quit: 07/2016    Years since quitting: 0.6  . Smokeless tobacco: Never Used  . Tobacco comment: Starting to Wean Off --  using Nicotine Patch  Substance and Sexual Activity  . Alcohol use: Yes    Alcohol/week: 0.0 oz    Comment: ? none now  . Drug use: Yes    Types: Marijuana    Comment: none for long time  . Sexual activity: Yes  Other Topics Concern  . Not on file  Social History Narrative  . Not on file     FAMILY HISTORY:  We obtained a detailed, 4-generation family history.  Significant diagnoses are listed below: Family History  Problem Relation Age of Onset  . Cancer Father        thought to be due to exposure to concrete  . Diabetes Mother   . Thyroid cancer Mother        dx in her 97s-60s  . Cancer Maternal Uncle        2 uncles with cancer NOS  . Brain cancer Paternal Aunt   . Cancer Cousin         maternal first cousin - NOS  . Cancer Cousin        maternal first cousin - NOS    The patient has a son and daughter who are cancer free.  She has three sisters and a brother who are all cancer free.  Her father is deceased but her mother is alive.  Her father died of a cancer that started in his leg and spread throughout his body.  It is thought that it was due to his exposure to concrete, as he used to pour concrete for a living.  He has four sisters and three brothers.  One sister died of a brain tumor.  There is no other reported family history of cancer on his side of the family.   The patient's mother had thyroid cancer in her 65's-60's.  She has four sisters and three brothers. Two brothers died of unknown forms of cancer.  One of these brothers had two daughters who died of an unknown form of cancer, both under 21.  There is no other reported family history of cancer.  Michele Owens is unaware of previous family history of genetic testing for hereditary cancer risks. Patient's maternal ancestors are of [place] American descent, and paternal ancestors are of [place], [place] and Caucasian descent. There is no reported Ashkenazi Jewish ancestry. There is no known consanguinity.  GENETIC COUNSELING ASSESSMENT: Michele Owens is a 56 y.o. female with a personal history of medullary thyroid cancer and family history of cancer which is somewhat suggestive of a hereditary cancer syndrome and predisposition to cancer. We, therefore, discussed and recommended the following at today's visit.   DISCUSSION: We disucssed that most thyroid cancer has a histology of papillary and is therefore more likely to be sporadic.  About 10% of thyroid cancer is due to Medullary thyroid cancer.  This type of thyroid cancer has a higher incidence of being hereditary, due to MEN2 (RET).  There are several things about her family that either lend to, or lend against, this being hereditary.  Her mother's  diagnosis of thyroid cancer is suspicious that maybe a hereditary cancer syndrome is in play.  However, both the patient and her mother are getting their cancer in mid to late middle age, whereas many individuals with MTC due to MEN2 get it in their 20's-30's.  Unifocal MTC is more commonly seen in sporadic MTC, rather than hereditary MTC, which also suggests that the patients cancer may be sporadic.  However, she has several family  members on the maternal side that have cancer, and with her diagnosis of MTC, she meets criteria for genetic testing.  We reviewed the characteristics, features and inheritance patterns of hereditary cancer syndromes. We also discussed genetic testing, including the appropriate family members to test, the process of testing, insurance coverage and turn-around-time for results. We discussed the implications of a negative, positive and/or variant of uncertain significant result. We recommended Michele Owens pursue genetic testing for the common hereditary cancer gene panel. The Hereditary Gene Panel offered by [facility] includes sequencing and/or deletion duplication testing of the following 47 genes: APC, ATM, AXIN2, BARD1, BMPR1A, BRCA1, BRCA2, BRIP1, CDH1, CDK4, CDKN2A (p14ARF), CDKN2A (p16INK4a), CHEK2, CTNNA1, DICER1, EPCAM (Deletion/duplication testing only), GREM1 (promoter region deletion/duplication testing only), KIT, MEN1, MLH1, MSH2, MSH3, MSH6, MUTYH, NBN, NF1, NHTL1, PALB2, PDGFRA, PMS2, POLD1, POLE, PTEN, RAD50, RAD51C, RAD51D, SDHB, SDHC, SDHD, SMAD4, SMARCA4. STK11, TP53, TSC1, TSC2, and VHL.  The following genes were evaluated for sequence changes only: SDHA and HOXB13 c.251G>A variant only.   Based on Michele Owens personal and family history of cancer, she meets medical criteria for genetic testing. Despite that she meets criteria, she may still have an out of pocket cost. We discussed that if her out of pocket cost for testing is over $100, the laboratory will call and  confirm whether she wants to proceed with testing.  If the out of pocket cost of testing is less than $100 she will be billed by the genetic testing laboratory.   PLAN: After considering the risks, benefits, and limitations, Michele Owens  provided informed consent to pursue genetic testing and the blood sample was sent to [facility] for analysis of the common hereditary cancer panel. Results should be available within approximately 2-3 weeks' time, at which point they will be disclosed by telephone to Michele Owens, as will any additional recommendations warranted by these results. Michele Owens will receive a summary of her genetic counseling visit and a copy of her results once available. This information will also be available in Epic. We encouraged Michele Owens to remain in contact with cancer genetics annually so that we can continuously update the family history and inform her of any changes in cancer genetics and testing that may be of benefit for her family. Michele Owens questions were answered to her satisfaction today. Our contact information was provided should additional questions or concerns arise.  Lastly, we encouraged Michele Owens to remain in contact with cancer genetics annually so that we can continuously update the family history and inform her of any changes in cancer genetics and testing that may be of benefit for this family.   Ms.  Owens questions were answered to her satisfaction today. Our contact information was provided should additional questions or concerns arise. Thank you for the referral and allowing [place] to share in the care of your patient.   Jadriel Saxer P. Florene Glen, [place], [facility] Certified Genetic Counselor [email or web address] phone: [phone]  The patient was seen for a total of 90 minutes in face-to-face genetic counseling.  This patient was discussed with Drs. Magrinat, Lindi Adie and/or Burr Medico who agrees with the above.     _______________________________________________________________________ For Office Staff:  Number of people involved in session: 2 Was an Intern/ student involved with case: no

## 2017-02-27 ENCOUNTER — Encounter: Payer: Self-pay | Admitting: Genetic Counselor

## 2017-02-27 DIAGNOSIS — Z0189 Encounter for other specified special examinations: Secondary | ICD-10-CM | POA: Insufficient documentation

## 2017-02-27 DIAGNOSIS — Z1379 Encounter for other screening for genetic and chromosomal anomalies: Secondary | ICD-10-CM | POA: Insufficient documentation

## 2017-02-28 ENCOUNTER — Telehealth: Payer: Self-pay | Admitting: Genetic Counselor

## 2017-02-28 ENCOUNTER — Ambulatory Visit: Payer: Self-pay | Admitting: Genetic Counselor

## 2017-02-28 DIAGNOSIS — C73 Malignant neoplasm of thyroid gland: Secondary | ICD-10-CM

## 2017-02-28 DIAGNOSIS — Z808 Family history of malignant neoplasm of other organs or systems: Secondary | ICD-10-CM

## 2017-02-28 DIAGNOSIS — Z1379 Encounter for other screening for genetic and chromosomal anomalies: Secondary | ICD-10-CM

## 2017-02-28 NOTE — Progress Notes (Signed)
HPI: Michele Owens was previously seen in the [facility] clinic due to a personal and family history of cancer and concerns regarding a hereditary predisposition to cancer. Please refer to our prior cancer genetics clinic note for more information regarding Michele Owens's medical, social and family histories, and our assessment and recommendations, at the time. Michele Owens recent genetic test results were disclosed to her, as were recommendations warranted by these results. These results and recommendations are discussed in more detail below.  CANCER HISTORY:    Thyroid cancer ([place])   09/23/2016 Initial Diagnosis    Thyroid cancer ([place])      03/05/2017 Genetic Testing    APC c.-30356G>C (Promter 1B) VUS identified on the Multi-Cancer panel.  The Multi-Gene Panel offered by [facility] includes sequencing and/or deletion duplication testing of the following 83 genes: ALK, APC, ATM, AXIN2,BAP1,  BARD1, BLM, BMPR1A, BRCA1, BRCA2, BRIP1, CASR, CDC73, CDH1, CDK4, CDKN1B, CDKN1C, CDKN2A (p14ARF), CDKN2A (p16INK4a), CEBPA, CHEK2, CTNNA1, DICER1, DIS3L2, EGFR (c.2369C>T, p.Thr790Met variant only), EPCAM (Deletion/duplication testing only), FH, FLCN, GATA2, GPC3, GREM1 (Promoter region deletion/duplication testing only), HOXB13 (c.251G>A, p.Gly84Glu), HRAS, KIT, MAX, MEN1, MET, MITF (c.952G>A, p.Glu318Lys variant only), MLH1, MSH2, MSH3, MSH6, MUTYH, NBN, NF1, NF2, NTHL1, PALB2, PDGFRA, PHOX2B, PMS2, POLD1, POLE, POT1, PRKAR1A, PTCH1, PTEN, RAD50, RAD51C, RAD51D, RB1, RECQL4, RET, RUNX1, SDHAF2, SDHA (sequence changes only), SDHB, SDHC, SDHD, SMAD4, SMARCA4, SMARCB1, SMARCE1, STK11, SUFU, TERT, TERT, TMEM127, TP53, TSC1, TSC2, VHL, WRN and WT1.  The report date is March 05, 2017        FAMILY HISTORY:  We obtained a detailed, 4-generation family history.  Significant diagnoses are listed below: Family History  Problem Relation Age of Onset  . Cancer Father        thought to be due to exposure  to concrete  . Diabetes Mother   . Thyroid cancer Mother        dx in her 2s-60s  . Cancer Maternal Uncle        2 uncles with cancer NOS  . Brain cancer Paternal Aunt   . Cancer Cousin        maternal first cousin - NOS  . Cancer Cousin        maternal first cousin - NOS    The patient has a son and daughter who are cancer free.  She has three sisters and a brother who are all cancer free.  Her father is deceased but her mother is alive.  Her father died of a cancer that started in his leg and spread throughout his body.  It is thought that it was due to his exposure to concrete, as he used to pour concrete for a living.  He has four sisters and three brothers.  One sister died of a brain tumor.  There is no other reported family history of cancer on his side of the family.   The patient's mother had thyroid cancer in her 53's-60's.  She has four sisters and three brothers. Two brothers died of unknown forms of cancer.  One of these brothers had two daughters who died of an unknown form of cancer, both under 53.  There is no other reported family history of cancer.  Michele Owens is unaware of previous family history of genetic testing for hereditary cancer risks. Patient's maternal ancestors are of [place] American descent, and paternal ancestors are of [place], [place] and Caucasian descent. There is no reported Ashkenazi Jewish ancestry. There is no known consanguinity.  GENETIC TEST RESULTS: Genetic testing reported out on March 05, 2017 through the Multi-cancer panel found no deleterious mutations.  The Multi-Gene Panel offered by [facility] includes sequencing and/or deletion duplication testing of the following 80 genes: ALK, APC, ATM, AXIN2,BAP1,  BARD1, BLM, BMPR1A, BRCA1, BRCA2, BRIP1, CASR, CDC73, CDH1, CDK4, CDKN1B, CDKN1C, CDKN2A (p14ARF), CDKN2A (p16INK4a), CEBPA, CHEK2, CTNNA1, DICER1, DIS3L2, EGFR (c.2369C>T, p.Thr790Met variant only), EPCAM  (Deletion/duplication testing only), FH, FLCN, GATA2, GPC3, GREM1 (Promoter region deletion/duplication testing only), HOXB13 (c.251G>A, p.Gly84Glu), HRAS, KIT, MAX, MEN1, MET, MITF (c.952G>A, p.Glu318Lys variant only), MLH1, MSH2, MSH3, MSH6, MUTYH, NBN, NF1, NF2, NTHL1, PALB2, PDGFRA, PHOX2B, PMS2, POLD1, POLE, POT1, PRKAR1A, PTCH1, PTEN, RAD50, RAD51C, RAD51D, RB1, RECQL4, RET, RUNX1, SDHAF2, SDHA (sequence changes only), SDHB, SDHC, SDHD, SMAD4, SMARCA4, SMARCB1, SMARCE1, STK11, SUFU, TERT, TERT, TMEM127, TP53, TSC1, TSC2, VHL, WRN and WT1.  The test report has been scanned into EPIC and is located under the Molecular Pathology section of the Results Review tab.   We discussed with Michele Owens that since the current genetic testing is not perfect, it is possible there may be a gene mutation in one of these genes that current testing cannot detect, but that chance is small. We also discussed, that it is possible that another gene that has not yet been discovered, or that we have not yet tested, is responsible for the cancer diagnoses in the family, and it is, therefore, important to remain in touch with cancer genetics in the future so that we can continue to offer Michele Owens the most up to date genetic testing.   Genetic testing did detect a Variant of Unknown Significance in the APC gene called c.-30356G>C (Promoter 1B). At this time, it is unknown if this variant is associated with increased cancer risk or if this is a normal finding, but most variants such as this get reclassified to being inconsequential. It should not be used to make medical management decisions. With time, we suspect the lab will determine the significance of this variant, if any. If we do learn more about it, we will try to contact Michele Owens to discuss it further. However, it is important to stay in touch with [place] periodically and keep the address and phone number up to date.    CANCER SCREENING RECOMMENDATIONS:  This result is  reassuring and indicates that Michele Owens likely does not have an increased risk for a future cancer due to a mutation in one of these genes. This normal test also suggests that Michele Owens cancer was most likely not due to an inherited predisposition associated with one of these genes.  Most cancers happen by chance and this negative test suggests that her cancer falls into this category.  We, therefore, recommended she continue to follow the cancer management and screening guidelines provided by her oncology and primary healthcare provider.   RECOMMENDATIONS FOR FAMILY MEMBERS: Women in this family might be at some increased risk of developing cancer, over the general population risk, simply due to the family history of cancer. We recommended women in this family have a yearly mammogram beginning at age 96, or 38 years younger than the earliest onset of cancer, an annual clinical breast exam, and perform monthly breast self-exams. Women in this family should also have a gynecological exam as recommended by their primary provider. All family members should have a colonoscopy by age 71.  FOLLOW-UP: Lastly, we discussed with Michele Owens that cancer genetics is a rapidly advancing field and it is possible  that new genetic tests will be appropriate for her and/or her family members in the future. We encouraged her to remain in contact with cancer genetics on an annual basis so we can update her personal and family histories and let her know of advances in cancer genetics that may benefit this family.   Our contact number was provided. Michele Owens questions were answered to her satisfaction, and she knows she is welcome to call us at anytime with additional questions or concerns.   Roma Kayser, MS, [facility] Certified Genetic Counselor [email or web address]

## 2017-02-28 NOTE — Telephone Encounter (Signed)
Called patient but there was a song on her VM and it seemed that I was put on hold.  Called patient's daughter, [name].  Reported out negative testing for pathogenic variants, but that a VUS in APC was identified.  Reviewed VUS' and that we consider this a negative test.  She voiced her understanding.

## 2017-03-21 ENCOUNTER — Other Ambulatory Visit: Payer: Medicaid Other

## 2017-03-21 ENCOUNTER — Encounter: Payer: Medicaid Other | Admitting: Genetic Counselor

## 2017-03-21 IMAGING — US US THYROID
1 series · 12 of 25 positions shown · non-contrast
Comparison: None.

CLINICAL DATA: Incidental on CT.  Thyroid nodules on chest CT.

EXAM:
THYROID ULTRASOUND
TECHNIQUE: Ultrasound examination of the thyroid gland and adjacent soft
tissues was performed.

[Series 1: us thyroid · 0.09mm/px · 12 of 37 slices shown]
[im 2/37]
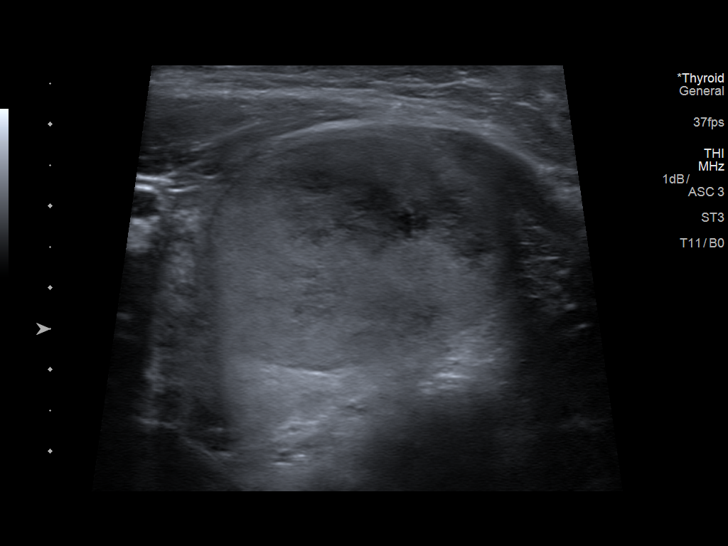
[im 5/37]
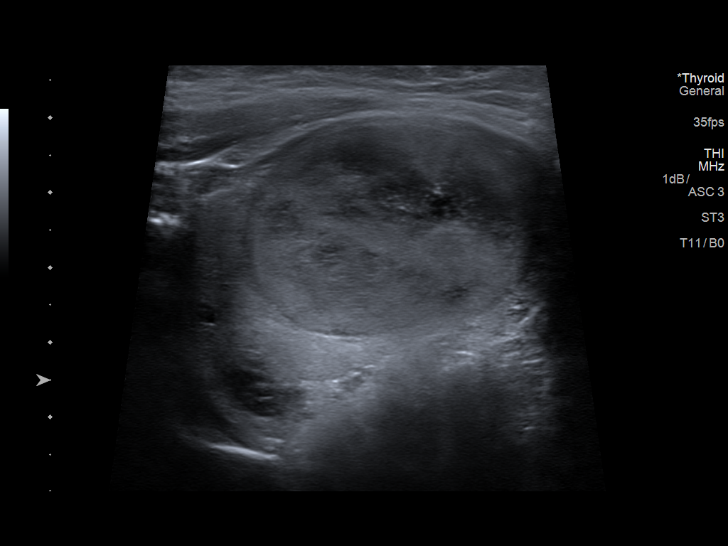
[im 8/37]
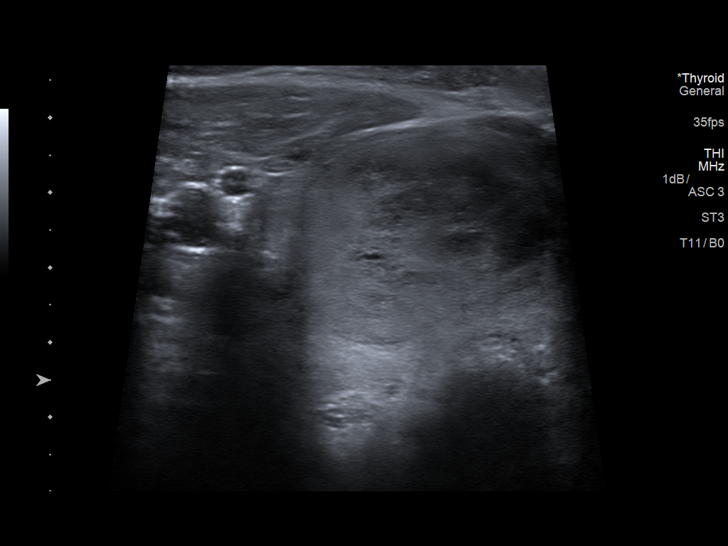
[im 11/37]
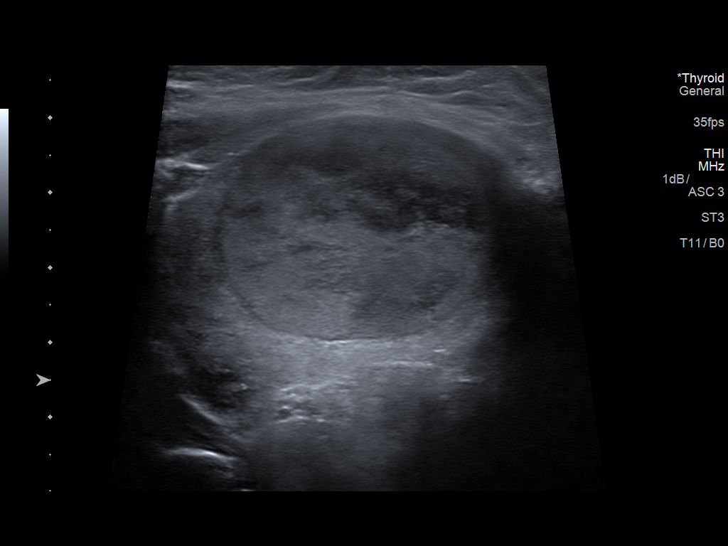
[im 14/37]
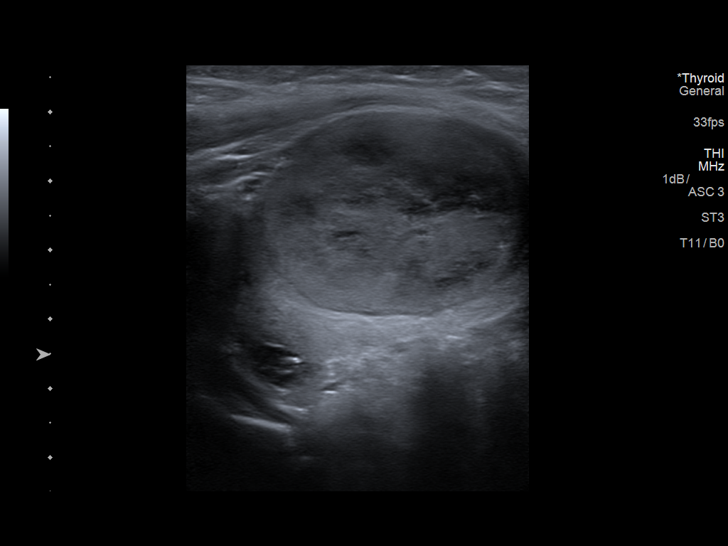
[im 17/37]
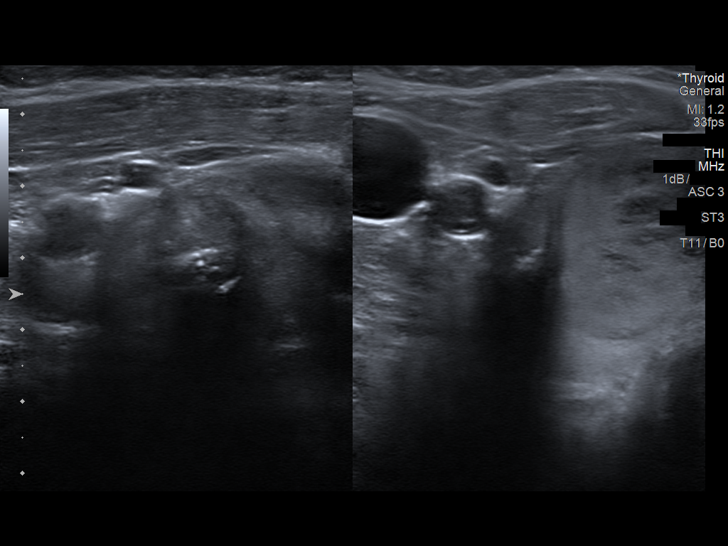
[im 20/37]
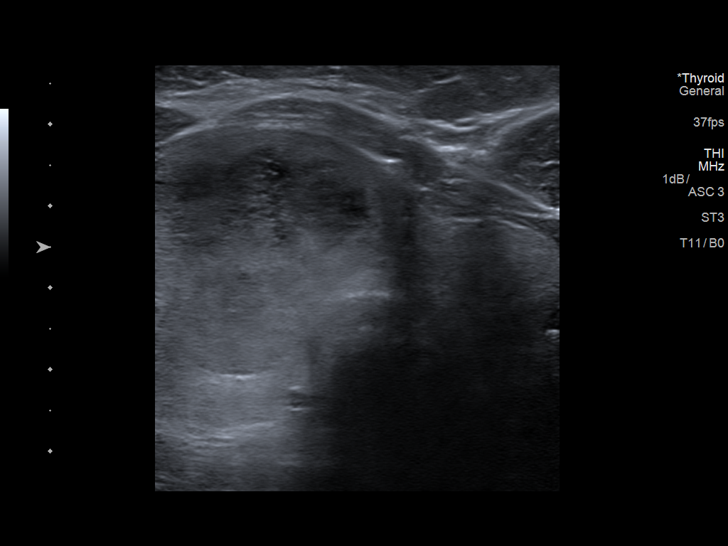
[im 23/37]
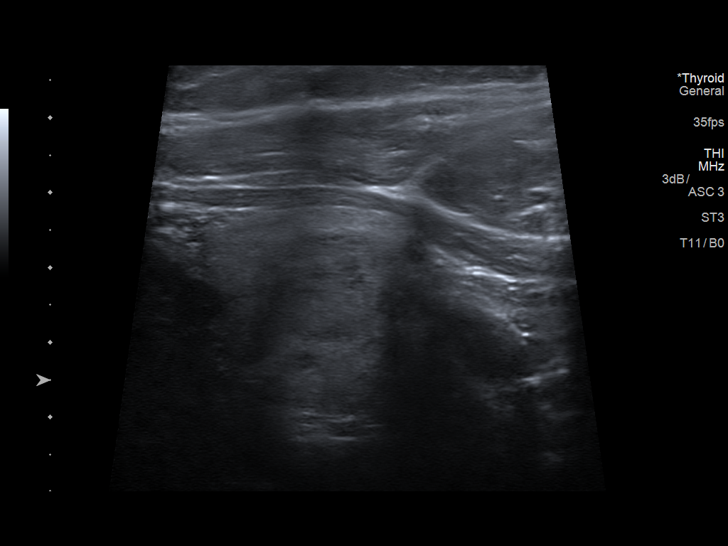
[im 26/37]
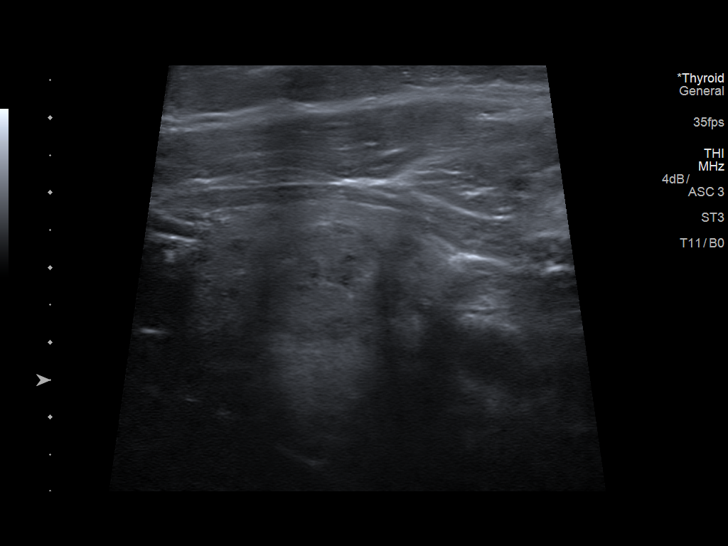
[im 29/37]
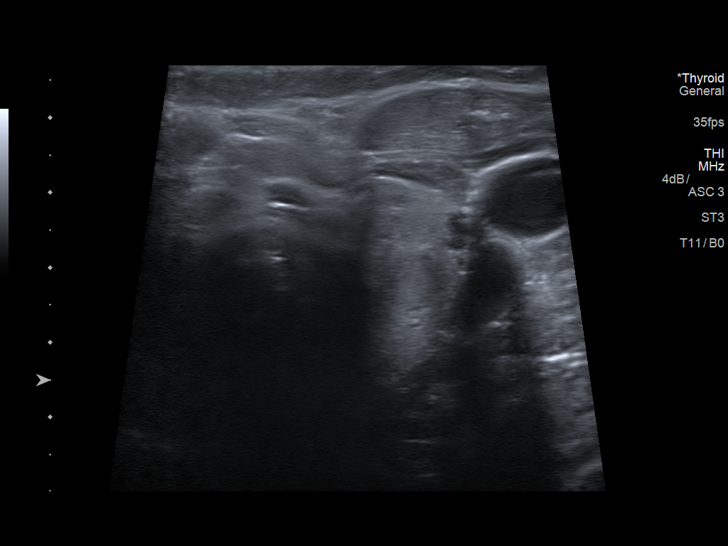
[im 32/37]
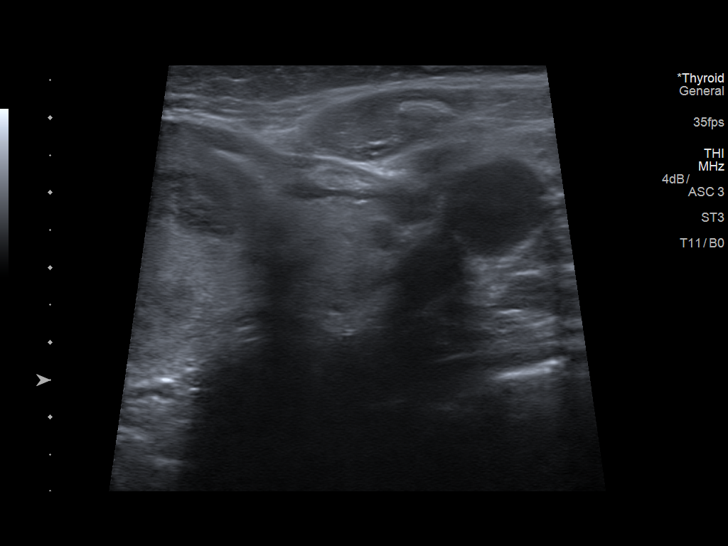
[im 35/37]
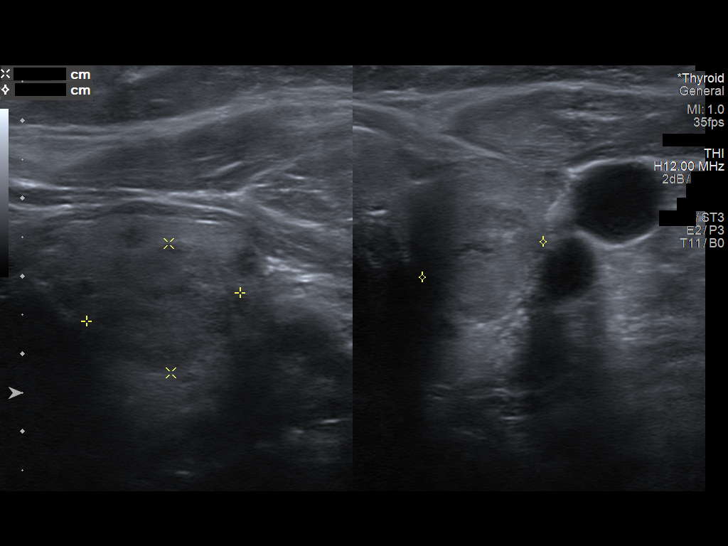

[12 of 25 positions shown; findings below may reference images not displayed]

FINDINGS: Parenchymal Echotexture: Moderately heterogenous

Estimated total number of nodules >/= 1 cm: 4

Number of spongiform nodules >/=  2 cm not described below (TR1): 0

Number of mixed cystic and solid nodules >/= 1.5 cm not described
below (TR2): 0

_________________________________________________________

Isthmus: Measures 1.1 cm.

No discrete nodules are identified within the thyroid isthmus.

_________________________________________________________

Right lobe: Measures 5.8 x 3.5 x 3.7 cm.

Nodule # 1:

Location: Right; Mid

Size: Measures 3.9 x 2.8 x 3.5 cm.

Composition: solid/almost completely solid (2)

Echogenicity: hypoechoic (2)

Shape: not taller-than-wide (0)

Margins: smooth (0)

Echogenic foci: none (0)

ACR TI-RADS total points: 4.

ACR TI-RADS risk category: TR4 (4-6 points).

ACR TI-RADS recommendations:

**Given size (>/= 1.5 cm) and appearance, fine needle aspiration of
this moderately suspicious nodule should be considered based on
TI-RADS criteria.

Nodule # 2:

Location: Right; Inferior

Size: 1.3 x 0.7 x 1.3 cm.

Composition: solid/almost completely solid (2)

Echogenicity: hypoechoic (2)

Shape: not taller-than-wide (0)

Margins: ill-defined (0)

Echogenic foci: none (0)

ACR TI-RADS total points: 4.

ACR TI-RADS risk category: TR4 (4-6 points).

ACR TI-RADS recommendations:

*Given size (>/= 1 - 1.4 cm) and appearance, a follow-up ultrasound
in 1 year should be considered based on TI-RADS criteria.

Nodule # 3:

Location: Right; Superior

Size: Measures 1.5 x 1.4 x 1.1 cm.

Composition: solid/almost completely solid (2)

Echogenicity: hypoechoic (2)

Shape: not taller-than-wide (0)

Margins: ill-defined (0)

Echogenic foci: punctate echogenic foci (3) and some peripheral
calcifications.

ACR TI-RADS total points: 7.

ACR TI-RADS risk category: TR5 (>/= 7 points).

ACR TI-RADS recommendations:

**Given size (>/= 1.0 cm) and appearance, fine needle aspiration of
this highly suspicious nodule should be considered based on TI-RADS
criteria.

_________________________________________________________

Left lobe: Measures 3.6 x 3.5 x 2.0 cm.

Nodule # 4:

Location: Left; Mid

Size: Measures 2.0 x 1.7 x 1.6 cm.

Composition: solid/almost completely solid (2)

Echogenicity: isoechoic (1)

Shape: not taller-than-wide (0)

Margins: ill-defined (0)

Echogenic foci: none (0)

ACR TI-RADS total points: 3.

ACR TI-RADS risk category: TR3 (3 points).

ACR TI-RADS recommendations:

*Given size (>/= 1.5 - 2.4 cm) and appearance, a follow-up
ultrasound in 1 year should be considered based on TI-RADS criteria.
IMPRESSION: Multi nodular goiter.

Nodules #1 and #3 in the right thyroid lobe both meet criteria for
biopsy.

Nodule #2 in the right thyroid lobe and Nodule #4 in left thyroid
lobe both meet criteria for 1 year follow-up.

The above is in keeping with the ACR TI-RADS recommendations - [HOSPITAL] 1750;[DATE].

## 2017-03-22 ENCOUNTER — Other Ambulatory Visit (HOSPITAL_COMMUNITY): Payer: Self-pay | Admitting: Internal Medicine

## 2017-04-17 ENCOUNTER — Encounter (HOSPITAL_COMMUNITY): Payer: Self-pay | Admitting: Emergency Medicine

## 2017-04-17 ENCOUNTER — Emergency Department (HOSPITAL_COMMUNITY): Payer: Medicaid Other

## 2017-04-17 ENCOUNTER — Emergency Department (HOSPITAL_COMMUNITY)
Admission: EM | Admit: 2017-04-17 | Discharge: 2017-04-17 | Disposition: A | Discharge status: Home / Self Care | Payer: Medicaid Other | Attending: Emergency Medicine | Admitting: Emergency Medicine

## 2017-04-17 DIAGNOSIS — I5042 Chronic combined systolic (congestive) and diastolic (congestive) heart failure: Secondary | ICD-10-CM | POA: Diagnosis not present

## 2017-04-17 DIAGNOSIS — R0602 Shortness of breath: Secondary | ICD-10-CM | POA: Diagnosis present

## 2017-04-17 DIAGNOSIS — I110 Hypertensive heart disease with heart failure: Secondary | ICD-10-CM | POA: Diagnosis not present

## 2017-04-17 DIAGNOSIS — E119 Type 2 diabetes mellitus without complications: Secondary | ICD-10-CM | POA: Insufficient documentation

## 2017-04-17 DIAGNOSIS — Z7902 Long term (current) use of antithrombotics/antiplatelets: Secondary | ICD-10-CM | POA: Diagnosis not present

## 2017-04-17 DIAGNOSIS — Z85850 Personal history of malignant neoplasm of thyroid: Secondary | ICD-10-CM | POA: Diagnosis not present

## 2017-04-17 DIAGNOSIS — Z79899 Other long term (current) drug therapy: Secondary | ICD-10-CM | POA: Diagnosis not present

## 2017-04-17 DIAGNOSIS — J441 Chronic obstructive pulmonary disease with (acute) exacerbation: Secondary | ICD-10-CM | POA: Insufficient documentation

## 2017-04-17 DIAGNOSIS — J45909 Unspecified asthma, uncomplicated: Secondary | ICD-10-CM | POA: Insufficient documentation

## 2017-04-17 DIAGNOSIS — Z7984 Long term (current) use of oral hypoglycemic drugs: Secondary | ICD-10-CM | POA: Diagnosis not present

## 2017-04-17 LAB — CBC
HCT: 38.2 % (ref 36.0–46.0)
HEMOGLOBIN: 11.8 g/dL — AB (ref 12.0–15.0)
MCH: 29.7 pg (ref 26.0–34.0)
MCHC: 30.9 g/dL (ref 30.0–36.0)
MCV: 96.2 fL (ref 78.0–100.0)
Platelets: 316 10*3/uL (ref 150–400)
RBC: 3.97 MIL/uL (ref 3.87–5.11)
RDW: 13.8 % (ref 11.5–15.5)
WBC: 14.9 10*3/uL — ABNORMAL HIGH (ref 4.0–10.5)

## 2017-04-17 LAB — BASIC METABOLIC PANEL
Anion gap: 7 (ref 5–15)
BUN: 9 mg/dL (ref 6–20)
CALCIUM: 8.5 mg/dL — AB (ref 8.9–10.3)
CO2: 33 mmol/L — AB (ref 22–32)
CREATININE: 0.56 mg/dL (ref 0.44–1.00)
Chloride: 100 mmol/L — ABNORMAL LOW (ref 101–111)
GFR calc Af Amer: >60 mL/min (ref >60)
Glucose, Bld: 106 mg/dL — ABNORMAL HIGH (ref 65–99)
Potassium: 3.9 mmol/L (ref 3.5–5.1)
Sodium: 140 mmol/L (ref 135–145)

## 2017-04-17 LAB — I-STAT TROPONIN, ED
Troponin i, poc: 0 ng/mL (ref 0.00–0.08)
Troponin i, poc: 0 ng/mL (ref 0.00–0.08)

## 2017-04-17 LAB — CBG MONITORING, ED: GLUCOSE-CAPILLARY: 97 mg/dL (ref 65–99)

## 2017-04-17 LAB — BRAIN NATRIURETIC PEPTIDE: B Natriuretic Peptide: 34.9 pg/mL (ref 0.0–100.0)

## 2017-04-17 MED ORDER — HYDROCODONE-ACETAMINOPHEN 5-325 MG PO TABS
2.0000 | ORAL_TABLET | Freq: Once | ORAL | Status: AC
  Administered 2017-04-17: 2 via ORAL
  Filled 2017-04-17: qty 2

## 2017-04-17 MED ORDER — PREDNISONE 20 MG PO TABS
60.0000 mg | ORAL_TABLET | Freq: Once | ORAL | Status: AC
  Administered 2017-04-17: 60 mg via ORAL
  Filled 2017-04-17: qty 3

## 2017-04-17 MED ORDER — ALBUTEROL SULFATE (2.5 MG/3ML) 0.083% IN NEBU
5.0000 mg | INHALATION_SOLUTION | Freq: Once | RESPIRATORY_TRACT | Status: AC
  Administered 2017-04-17: 5 mg via RESPIRATORY_TRACT
  Filled 2017-04-17: qty 6

## 2017-04-17 MED ORDER — LEVOFLOXACIN 250 MG PO TABS: 250.0000 mg | ORAL_TABLET | Freq: Every day | ORAL | 0 refills | Status: DC

## 2017-04-17 MED ORDER — PREDNISONE 50 MG PO TABS: ORAL_TABLET | ORAL | 0 refills | Status: DC

## 2017-04-17 NOTE — Discharge Instructions (Signed)
Please follow with your primary care doctor in the next 2 days for a check-up. They must obtain records for further management.  ° °Do not hesitate to return to the Emergency Department for any new, worsening or concerning symptoms.  ° °

## 2017-04-17 NOTE — ED Triage Notes (Signed)
Per [facility] patient c/o SOB over past week but gotten worse yesterday. Patient has COPD and on continuous O2 at home.  Patient was 94% on 2L after neb treatment Albuterol 10mg  Atrovent 0.5mg  patient O2 98%. Patient c/o chest pain with coughing.

## 2017-04-17 NOTE — ED Notes (Signed)
Pt is demanding crackers, coffee, medications, and that we hurry up.

## 2017-04-17 NOTE — ED Notes (Signed)
Patient given coffee and graham crackers per request, provider aware. No other acute needs identified at this time. Will continue to monitor.

## 2017-04-17 NOTE — ED Notes (Signed)
Patient transported to X-ray 

## 2017-04-17 NOTE — ED Notes (Signed)
ED Provider at bedside. 

## 2017-04-17 NOTE — ED Provider Notes (Signed)
[facility] DEPT Provider Note   CSN: [id number] Arrival date & time: 04/17/17  0754     History   Chief Complaint Chief Complaint  Patient presents with  . Shortness of Breath    HPI   Blood pressure (!) 124/52, pulse 77, temperature 98 F (36.7 C), temperature source Oral, resp. rate 16, SpO2 94 %.  Michele Owens is a 57 y.o. female with past medical history significant for COPD (trying to quit smoking) CHF, thyroid cancer treated by thyroidectomy, non-insulin-dependent diabetes complaining of chest pain radiating up to the right neck worsened by palpation, cough, movement and position she states that she has had this for approximately 1 week.  She has been taking hydrocodone which she takes chronically at home with little relief.  Patient with past medical history significant for COPD, she has been producing more sputum than normal.  She denies fever or chills.  She uses 2 L of oxygen at all times at home, she has not needed to increase her oxygen usage.  Triage note states that she is short of breath however she denies this to me.  She denies orthopnea, PND, increasing peripheral edema.  Past Medical History:  Diagnosis Date  . Anxiety   . Arthritis   . Asthma   . Chronic diastolic CHF (congestive heart failure) ([place])   . COPD (chronic obstructive pulmonary disease) (HCC)    Uses Oxygen at night  . Depression   . Diabetes mellitus without complication ([place])   . Dyspnea    occ  . Family history of thyroid cancer   . GERD (gastroesophageal reflux disease)   . Gout   . Headache    migraines  . History of DVT of lower extremity   . History of nuclear stress test    Myoview 2/17:  Low risk stress nuclear study with a small, moderate intensity, partially reversible inferior lateral defect consistent with small prior infarct and minimal peri-infarct ischemia; EF 68 with normal wall motion  . Hypertension   . Pneumonia   . Thyroid cancer ([place])  09/23/2016    Patient Active Problem List   Diagnosis Date Noted  . Genetic testing 02/27/2017  . Family history of thyroid cancer   . Hypomagnesemia 10/03/2016  . Boil, breast 10/03/2016  . Atypical chest pain   . HLD (hyperlipidemia) 09/23/2016  . COPD (chronic obstructive pulmonary disease) ([place]) 09/23/2016  . Gout 09/23/2016  . DVT (deep vein thrombosis) in pregnancy ([place]) 09/23/2016  . Thyroid cancer ([place]) 09/23/2016  . Hypocalcemia 09/23/2016  . AKI (acute kidney injury) ([place]) 09/23/2016  . 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 Acute pulmonary edema (HCC)   . Acute hypoxemic respiratory failure ([place])   . Acute on chronic respiratory failure with hypoxemia ([place])   . Ventilator dependent ([place])   . Respiratory failure ([place]) 07/16/2016  . Chronic combined systolic (congestive) and diastolic (congestive) heart failure ([place]) 02/10/2016  . Dyspnea   . CAP (community acquired pneumonia) 01/23/2016  . 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 Pulmonary hypertension ([place]) 01/23/2016  . Sepsis ([place]) 01/23/2016  . Obesity hypoventilation syndrome ([place]) 08/26/2015  . Chest pain 06/16/2015  . Dyslipidemia associated with type 2 diabetes mellitus ([place]) 04/24/2015  . Leukocytosis 04/24/2015  . Controlled type 2 diabetes mellitus without complication, without long-term current use of insulin ([place]) 04/24/2015  . Anxiety 04/24/2015  . Benign essential HTN 04/24/2015  . Normocytic anemia 04/24/2015  . Asthma with  COPD with exacerbation ([place]) 04/24/2015  . OSA (obstructive sleep apnea) 05/10/2012  . Tobacco abuse 07/04/2011    Past Surgical History:  Procedure Laterality Date  . BUNIONECTOMY Bilateral   . CARDIAC CATHETERIZATION N/A 06/23/2015   Procedure: Right/Left Heart Cath and Coronary Angiography;  Surgeon: [name], MD;  Location: [facility];  Service: Cardiovascular;  Laterality: N/A;  . COLONOSCOPY WITH PROPOFOL  N/A 12/15/2015   Procedure: COLONOSCOPY WITH PROPOFOL;  Surgeon: [name], MD;  Location: [facility];  Service: Endoscopy;  Laterality: N/A;  . THYROIDECTOMY  09/19/2016  . THYROIDECTOMY N/A 09/19/2016   Procedure: TOTAL THYROIDECTOMY;  Surgeon: [name], MD;  Location: [facility];  Service: General;  Laterality: N/A;  . TONSILLECTOMY    . TOTAL ABDOMINAL HYSTERECTOMY  07/14/10    OB History    No data available       Home Medications    Prior to Admission medications   Medication Sig Start Date End Date Taking? Authorizing Provider  acetaminophen (TYLENOL) 500 MG tablet Take 500 mg by mouth every 6 (six) hours as needed for headache.   Yes [provider]  albuterol (ACCUNEB) 1.25 MG/3ML nebulizer solution Take 1 ampule by nebulization 2 (two) times daily.   Yes [provider]  albuterol (PROVENTIL HFA;VENTOLIN HFA) 108 (90 BASE) MCG/ACT inhaler Inhale 1-2 puffs into the lungs every 6 (six) hours as needed for wheezing. 04/09/12  Yes Kindl, Nelda Severe, MD  allopurinol (ZYLOPRIM) 100 MG tablet Take 100 mg by mouth daily.   Yes [provider]  aspirin-acetaminophen-caffeine (EXCEDRIN MIGRAINE) 3108560049 MG tablet Take 2 tablets by mouth daily as needed for headache.   Yes [provider]  budesonide-formoterol (SYMBICORT) 80-4.5 MCG/ACT inhaler Inhale 2 puffs into the lungs 2 (two) times daily. 07/24/16  Yes Lavina Hamman, MD  calcitRIOL (ROCALTROL) 0.5 MCG capsule Take 1 capsule (0.5 mcg total) by mouth daily. 09/26/16  Yes Hongalgi, [name], MD  calcium carbonate (TUMS) 500 MG chewable tablet Chew 4 tablets (800 mg of elemental calcium total) by mouth 4 (four) times daily. 09/25/16  Yes Hongalgi, [name], MD  carvedilol (COREG) 6.25 MG tablet TAKE ONE TABLET BY MOUTH TWICE DAILY WITH A MEAL 03/23/17  Yes [name], MD  clonazePAM (KLONOPIN) 2 MG tablet Take 1 tablet (2 mg total) by mouth 2 (two) times daily. 01/27/16  Yes Mikhail, [place], DO    diclofenac sodium (VOLTAREN) 1 % GEL Apply 2 g topically 4 (four) times daily as needed (for pain).    Yes [provider]  dicyclomine (BENTYL) 20 MG tablet Take 1 tablet (20 mg total) by mouth 2 (two) times daily as needed (abdominal pain/cramping). 11/28/15  Yes Antonietta Breach, PA-C  EPINEPHrine 0.3 mg/0.3 mL IJ SOAJ injection Inject 0.3 mg into the muscle daily as needed (allergic reaction).   Yes [provider]  FEROSUL 325 (65 Fe) MG tablet Take 325 mg by mouth daily. 03/22/17  Yes [provider]  furosemide (LASIX) 40 MG tablet Take 1 tablet (40 mg total) by mouth daily. Patient taking differently: Take 40 mg by mouth 2 (two) times daily.  09/25/16  Yes Hongalgi, [name], MD  HYDROcodone-acetaminophen (NORCO/VICODIN) 5-325 MG tablet Take 1-2 tablets by mouth every 4 (four) hours as needed for moderate pain. 09/20/16  Yes [name], MD  ipratropium-albuterol (DUONEB) 0.5-2.5 (3) MG/3ML SOLN Use one vial in Nebulizer four times daily as directed. 04/05/17  Yes [provider]  levothyroxine (  SYNTHROID, LEVOTHROID) 200 MCG tablet Take 200 mcg by mouth daily before breakfast.   Yes [provider]  lisinopril (PRINIVIL,ZESTRIL) 5 MG tablet TAKE ONE TABLET BY MOUTH EVERY DAY 01/12/17  Yes Bensimhon, [name], MD  loratadine (CLARITIN) 10 MG tablet Take 10 mg by mouth daily as needed for allergies.    Yes [provider]  metFORMIN (GLUCOPHAGE) 500 MG tablet Take 1 tablet (500 mg total) by mouth 2 (two) times daily with a meal. 12/11/14  Yes [name], MD  mirtazapine (REMERON) 15 MG tablet Take 15 mg by mouth at bedtime. 11/16/14  Yes [provider]  nicotine (NICODERM CQ - DOSED IN MG/24 HOURS) 14 mg/24hr patch Place 1 patch (14 mg total) onto the skin daily. 07/25/16  Yes Lavina Hamman, MD  nystatin cream (MYCOSTATIN) APPLY TO THE AFFECTED AREA(S) TWICE DAILY AS NEEDED 04/13/17  Yes [provider]  omeprazole  (PRILOSEC) 40 MG capsule Take 40 mg by mouth daily. 04/13/15  Yes [provider]  potassium chloride SA (K-DUR,KLOR-CON) 20 MEQ tablet Take 2 tablets (40 mEq total) by mouth daily. 08/01/16  Yes [name], MD  pravastatin (PRAVACHOL) 40 MG tablet Take 40 mg by mouth every evening.  03/29/15  Yes [provider]  QC STOOL SOFTENER PLS LAXATIVE 8.6-50 MG tablet Take 1 tablet by mouth at bedtime. 04/11/17  Yes [provider]  tiotropium (SPIRIVA) 18 MCG inhalation capsule Place 18 mcg into inhaler and inhale daily.   Yes [provider]  TUSSIN DM 100-10 MG/5ML liquid take 2 teaspoonfuls by mouth every 4-6 hours as needed for cough; Do not exceed 6 doses in a 24-hour period 02/19/17  Yes [provider]  acetaminophen (TYLENOL) 325 MG tablet Take 1 tablet (325 mg total) by mouth every 6 (six) hours as needed for mild pain or headache. Caution: Acetaminophen from all sources not to exceed greater than 3 g per day. Patient not taking: Reported on 04/17/2017 10/04/16   Velvet Bathe, MD  benzonatate (TESSALON) 200 MG capsule Take 1 capsule (200 mg total) by mouth 3 (three) times daily as needed for cough. Patient not taking: Reported on 04/17/2017 07/24/16   Lavina Hamman, MD  levofloxacin (LEVAQUIN) 250 MG tablet Take 1 tablet (250 mg total) by mouth daily. 04/17/17   Michele Owens, Elmyra Ricks, PA-C  predniSONE (DELTASONE) 50 MG tablet Take 1 tablet daily with breakfast 04/17/17   August Longest, Elmyra Ricks, PA-C  SYNTHROID 100 MCG tablet Take 1 tablet (100 mcg total) by mouth daily. Patient not taking: Reported on 04/17/2017 09/20/16   [name], MD    Family History Family History  Problem Relation Age of Onset  . Cancer Father        thought to be due to exposure to concrete  . Diabetes Mother   . Thyroid cancer Mother        dx in her 64s-60s  . Cancer Maternal Uncle        2 uncles with cancer NOS  . Brain cancer Paternal Aunt   . Cancer Cousin        maternal  first cousin - NOS  . Cancer Cousin        maternal first cousin - NOS    Social History Social History   Tobacco Use  . Smoking status: Former Smoker    Packs/day: 0.50    Years: 41.00    Pack years: 20.50    Types: Cigarettes    Last attempt  to quit: 07/2016    Years since quitting: 0.7  . Smokeless tobacco: Never Used  . Tobacco comment: Starting to Wean Off -- using Nicotine Patch  Substance Use Topics  . Alcohol use: Yes    Alcohol/week: 0.0 oz    Comment: ? none now  . Drug use: Yes    Types: Marijuana    Comment: none for long time     Allergies   Bee venom; Ibuprofen; Lamisil [terbinafine]; and Nsaids   Review of Systems Review of Systems  A complete review of systems was obtained and all systems are negative except as noted in the HPI and PMH.   Physical Exam Updated Vital Signs BP (!) 114/99   Pulse 82   Temp 98 F (36.7 C) (Oral)   Resp (!) 21   SpO2 95%   Physical Exam  Constitutional: She is oriented to person, place, and time. She appears well-developed and well-nourished. No distress.  HENT:  Head: Normocephalic and atraumatic.  Mouth/Throat: Oropharynx is clear and moist.  Eyes: Conjunctivae and EOM are normal. Pupils are equal, round, and reactive to light.  Neck: Normal range of motion.  No midline C-spine  tenderness to palpation or step-offs appreciated. Patient has full range of motion without pain.  Grip strength, biceps, triceps 5/5 bilaterally;  can differentiate between pinprick and light touch bilaterally.   Cardiovascular: Normal rate, regular rhythm and intact distal pulses.  Pulmonary/Chest: Effort normal and breath sounds normal. She exhibits tenderness.  Abdominal: Soft. There is no tenderness.  Musculoskeletal: Normal range of motion.       Arms: Diffusely exquisitely tender to light palpation as diagrammed, no rash, no carotid bruits.   Neurological: She is alert and oriented to person, place, and time.  Skin: She is not  diaphoretic.  Psychiatric: She has a normal mood and affect.  Nursing note and vitals reviewed.    ED Treatments / Results  Labs (all labs ordered are listed, but only abnormal results are displayed) Labs Reviewed  BASIC METABOLIC PANEL - Abnormal; Notable for the following components:      Result Value   Chloride 100 (*)    CO2 33 (*)    Glucose, Bld 106 (*)    Calcium 8.5 (*)    All other components within normal limits  CBC - Abnormal; Notable for the following components:   WBC 14.9 (*)    Hemoglobin 11.8 (*)    All other components within normal limits  BRAIN NATRIURETIC PEPTIDE  CBG MONITORING, ED  POC URINE PREG, ED  I-STAT TROPONIN, ED  I-STAT TROPONIN, ED    EKG  EKG Interpretation  Date/Time:  Tuesday April 17 2017 08:23:10 EST Ventricular Rate:  80 PR Interval:    QRS Duration: 81 QT Interval:  410 QTC Calculation: 473 R Axis:   48 Text Interpretation:  Sinus rhythm Low voltage, precordial leads Borderline T abnormalities, anterior leads Artifact in lead(s) I III aVR aVL aVF Poor data quality Confirmed by Dorie Rank [phone]) on 04/17/2017 8:40:23 AM Also confirmed by Dorie Rank [phone]), editor Lynder Parents [phone])  on 04/17/2017 11:01:46 AM       Radiology Dg Chest 2 View  Result Date: 04/17/2017 CLINICAL DATA:  patient c/o SOB over past week but gotten worse yesterday. Patient has COPD and on continuous O2 at home. C/o cough and chest pain EXAM: CHEST  2 VIEW COMPARISON:  01/06/2017 FINDINGS: Surgical changes in the low neck. Midline trachea. Mild cardiomegaly. Mediastinal contours otherwise within  normal limits. No pleural effusion or pneumothorax. Low lung volumes. Bibasilar areas of similar linear opacity are likely indicative of scarring. IMPRESSION: Mild cardiomegaly and bibasilar scarring with low lung volumes. No acute findings. Electronically Signed   By: Abigail Miyamoto M.D.   On: 04/17/2017 10:18   Dg Cervical Spine Complete  Result Date:  04/17/2017 CLINICAL DATA:  Cervical spine pain after a fall. EXAM: CERVICAL SPINE - COMPLETE 4+ VIEW COMPARISON:  Cervical spine radiographs 12/29/2016 FINDINGS: Cervical spine is visualized the skull base through the cervicothoracic junction. The prevertebral soft tissues are within normal limits. Mild endplate degenerative changes and uncovertebral spurring is present bilaterally at C5-6 and C6-7. Surgical clips are present at the thyroid bed, unchanged. No acute fracture traumatic subluxation is present. The lung apices are clear. IMPRESSION: 1. No acute abnormality. 2. Stable degenerative changes at C5-6 and C6-7. Electronically Signed   By: San Morelle M.D.   On: 04/17/2017 14:22   Dg Lumbar Spine Complete  Result Date: 04/17/2017 CLINICAL DATA:  Pain.  Fall.  Initial encounter. EXAM: LUMBAR SPINE - COMPLETE 4+ VIEW COMPARISON:  Lumbar spine radiographs 12/29/2016. FINDINGS: Five non rib-bearing lumbar type vertebral bodies are present. Vertebral body heights and alignment are maintained. Disc spaces are stable. No acute fracture traumatic subluxation is present. IMPRESSION: 1. No acute abnormality. 2. Stable mild degenerative changes. Electronically Signed   By: San Morelle M.D.   On: 04/17/2017 14:23    Procedures Procedures (including critical care time)  Medications Ordered in ED Medications  HYDROcodone-acetaminophen (NORCO/VICODIN) 5-325 MG per tablet 2 tablet (2 tablets Oral Given 04/17/17 1028)  predniSONE (DELTASONE) tablet 60 mg (60 mg Oral Given 04/17/17 1028)  albuterol (PROVENTIL) (2.5 MG/3ML) 0.083% nebulizer solution 5 mg (5 mg Nebulization Given 04/17/17 1047)     Initial Impression / Assessment and Plan / ED Course  I have reviewed the triage vital signs and the nursing notes.  Pertinent labs & imaging results that were available during my care of the patient were reviewed by me and considered in my medical decision making (see chart for details).     Vitals:    04/17/17 1025 04/17/17 1050 04/17/17 1145 04/17/17 1330  BP: (!) 106/59  (!) 109/46 (!) 114/99  Pulse: 79  68 82  Resp: 16  20 (!) 21  Temp:      TempSrc:      SpO2: 96% 96% 94% 95%    Medications  HYDROcodone-acetaminophen (NORCO/VICODIN) 5-325 MG per tablet 2 tablet (2 tablets Oral Given 04/17/17 1028)  predniSONE (DELTASONE) tablet 60 mg (60 mg Oral Given 04/17/17 1028)  albuterol (PROVENTIL) (2.5 MG/3ML) 0.083% nebulizer solution 5 mg (5 mg Nebulization Given 04/17/17 1047)    Michele Owens is 57 y.o. female presenting with chest pain radiating up to the right anterior neck onset 1 week ago, this does appear to be atypical with no exacerbation with exertion.  As per triage note she states that she was short of breath however she is denying this multiple times to me.  Patient is a extremely difficult historian, difficult to focus and get clear answers from.  Patient's EKG with no acute findings, initial troponin negative, chest x-ray without acute findings, troponin negative.  Vicodin given for pain control.  After patient is updated as to findings she states that she would like her neck and back x-ray because she states she had a fall 1 month ago and is worried that she has a broken bone.  Repeat troponin negative.  Lung sounds remain clear to auscultation.  I think there may be an element of muscular skeletal pain from a COPD exacerbation, patient was given prednisone and nebulizer in the ED.  P troponin negative, patient states that her chest pain has resolved after nebulizer treatment, I think this is probably a COPD exacerbation.  Imaging found to be negative.  Patient will go home with a short course of prednisone, Levaquin.  Will follow closely with primary care.  Evaluation does not show pathology that would require ongoing emergent intervention or inpatient treatment. Pt is hemodynamically stable and mentating appropriately. Discussed findings and plan with patient/guardian, who agrees  with care plan. All questions answered. Return precautions discussed and outpatient follow up given.     Final Clinical Impressions(s) / ED Diagnoses   Final diagnoses:  COPD exacerbation [facility])    ED Discharge Orders        Ordered    predniSONE (DELTASONE) 50 MG tablet     04/17/17 1433    levofloxacin (LEVAQUIN) 250 MG tablet  Daily     04/17/17 1433       Kace Hartje, Charna Elizabeth 04/17/17 1447    Dorie Rank, MD 04/17/17 [phone]

## 2017-04-17 NOTE — ED Notes (Signed)
Pt has let writer know that if she does not get her calming medicine quickly that she will be off the chain.

## 2017-04-17 NOTE — ED Notes (Signed)
Patient refused to let the writer obtain vital signs prior to discharge. Patient upset about her prescriptions not being called in (which pt has been advised by the provider as well as Probation officer that we are unable to do that) and upset about not receiving lunch. Advised patient that we can provide her with a [place] sandwich and she immediately refused further treatment. Charge nurse aware. Patient dressed herself, removed herself from the monitor and walked to the wheelchair to be escorted to the waiting room.

## 2017-04-24 ENCOUNTER — Other Ambulatory Visit (HOSPITAL_COMMUNITY): Payer: Self-pay | Admitting: Internal Medicine

## 2017-06-06 ENCOUNTER — Emergency Department (HOSPITAL_COMMUNITY)
Admission: EM | Admit: 2017-06-06 | Discharge: 2017-06-06 | Disposition: A | Discharge status: Home / Self Care | Payer: Medicaid Other | Attending: Emergency Medicine | Admitting: Emergency Medicine

## 2017-06-06 ENCOUNTER — Encounter (HOSPITAL_COMMUNITY): Payer: Self-pay | Admitting: *Deleted

## 2017-06-06 ENCOUNTER — Emergency Department (HOSPITAL_COMMUNITY): Payer: Medicaid Other

## 2017-06-06 DIAGNOSIS — R1084 Generalized abdominal pain: Secondary | ICD-10-CM | POA: Insufficient documentation

## 2017-06-06 DIAGNOSIS — I110 Hypertensive heart disease with heart failure: Secondary | ICD-10-CM | POA: Diagnosis not present

## 2017-06-06 DIAGNOSIS — E890 Postprocedural hypothyroidism: Secondary | ICD-10-CM | POA: Insufficient documentation

## 2017-06-06 DIAGNOSIS — E119 Type 2 diabetes mellitus without complications: Secondary | ICD-10-CM | POA: Insufficient documentation

## 2017-06-06 DIAGNOSIS — R197 Diarrhea, unspecified: Secondary | ICD-10-CM | POA: Diagnosis not present

## 2017-06-06 DIAGNOSIS — Z7984 Long term (current) use of oral hypoglycemic drugs: Secondary | ICD-10-CM | POA: Insufficient documentation

## 2017-06-06 DIAGNOSIS — I5042 Chronic combined systolic (congestive) and diastolic (congestive) heart failure: Secondary | ICD-10-CM | POA: Insufficient documentation

## 2017-06-06 DIAGNOSIS — J45909 Unspecified asthma, uncomplicated: Secondary | ICD-10-CM | POA: Diagnosis not present

## 2017-06-06 DIAGNOSIS — J449 Chronic obstructive pulmonary disease, unspecified: Secondary | ICD-10-CM | POA: Diagnosis not present

## 2017-06-06 DIAGNOSIS — R109 Unspecified abdominal pain: Secondary | ICD-10-CM | POA: Diagnosis present

## 2017-06-06 DIAGNOSIS — Z87891 Personal history of nicotine dependence: Secondary | ICD-10-CM | POA: Insufficient documentation

## 2017-06-06 DIAGNOSIS — M25562 Pain in left knee: Secondary | ICD-10-CM | POA: Insufficient documentation

## 2017-06-06 DIAGNOSIS — Z79899 Other long term (current) drug therapy: Secondary | ICD-10-CM | POA: Insufficient documentation

## 2017-06-06 LAB — COMPREHENSIVE METABOLIC PANEL
ALBUMIN: 4.2 g/dL (ref 3.5–5.0)
ALT: 19 U/L (ref 14–54)
AST: 23 U/L (ref 15–41)
Alkaline Phosphatase: 68 U/L (ref 38–126)
Anion gap: 10 (ref 5–15)
BILIRUBIN TOTAL: 0.6 mg/dL (ref 0.3–1.2)
BUN: 8 mg/dL (ref 6–20)
CO2: 33 mmol/L — ABNORMAL HIGH (ref 22–32)
CREATININE: 0.55 mg/dL (ref 0.44–1.00)
Calcium: 8.9 mg/dL (ref 8.9–10.3)
Chloride: 97 mmol/L — ABNORMAL LOW (ref 101–111)
GFR calc Af Amer: >60 mL/min (ref >60)
GLUCOSE: 104 mg/dL — AB (ref 65–99)
POTASSIUM: 4.3 mmol/L (ref 3.5–5.1)
Sodium: 140 mmol/L (ref 135–145)
TOTAL PROTEIN: 7.8 g/dL (ref 6.5–8.1)

## 2017-06-06 LAB — CBC WITH DIFFERENTIAL/PLATELET
BASOS ABS: 0 10*3/uL (ref 0.0–0.1)
Basophils Relative: 0 %
EOS ABS: 0.2 10*3/uL (ref 0.0–0.7)
EOS PCT: 1 %
HCT: 39.5 % (ref 36.0–46.0)
Hemoglobin: 12.1 g/dL (ref 12.0–15.0)
LYMPHS ABS: 2.5 10*3/uL (ref 0.7–4.0)
LYMPHS PCT: 19 %
MCH: 28.9 pg (ref 26.0–34.0)
MCHC: 30.6 g/dL (ref 30.0–36.0)
MCV: 94.3 fL (ref 78.0–100.0)
MONO ABS: 0.7 10*3/uL (ref 0.1–1.0)
Monocytes Relative: 5 %
Neutro Abs: 9.6 10*3/uL — ABNORMAL HIGH (ref 1.7–7.7)
Neutrophils Relative %: 75 %
PLATELETS: 278 10*3/uL (ref 150–400)
RBC: 4.19 MIL/uL (ref 3.87–5.11)
RDW: 13.5 % (ref 11.5–15.5)
WBC: 13 10*3/uL — AB (ref 4.0–10.5)

## 2017-06-06 LAB — URINALYSIS, ROUTINE W REFLEX MICROSCOPIC
Bilirubin Urine: NEGATIVE
GLUCOSE, UA: NEGATIVE mg/dL
Hgb urine dipstick: NEGATIVE
KETONES UR: NEGATIVE mg/dL
LEUKOCYTES UA: NEGATIVE
NITRITE: NEGATIVE
PH: 7 (ref 5.0–8.0)
Protein, ur: NEGATIVE mg/dL
SPECIFIC GRAVITY, URINE: 1.031 — AB (ref 1.005–1.030)

## 2017-06-06 LAB — CBG MONITORING, ED: Glucose-Capillary: 106 mg/dL — ABNORMAL HIGH (ref 65–99)

## 2017-06-06 LAB — I-STAT TROPONIN, ED: Troponin i, poc: 0 ng/mL (ref 0.00–0.08)

## 2017-06-06 LAB — LIPASE, BLOOD: LIPASE: 25 U/L (ref 11–51)

## 2017-06-06 MED ORDER — IOPAMIDOL (ISOVUE-300) INJECTION 61%
INTRAVENOUS | Status: AC
  Administered 2017-06-06: 100 mL via INTRAVENOUS
  Filled 2017-06-06: qty 100

## 2017-06-06 MED ORDER — MORPHINE SULFATE (PF) 4 MG/ML IV SOLN
4.0000 mg | Freq: Once | INTRAVENOUS | Status: AC
  Administered 2017-06-06: 4 mg via INTRAVENOUS
  Filled 2017-06-06: qty 1

## 2017-06-06 MED ORDER — IOPAMIDOL (ISOVUE-300) INJECTION 61%
100.0000 mL | Freq: Once | INTRAVENOUS | Status: AC | PRN
  Administered 2017-06-06: 100 mL via INTRAVENOUS

## 2017-06-06 MED ORDER — SODIUM CHLORIDE 0.9 % IJ SOLN
INTRAMUSCULAR | Status: AC
  Filled 2017-06-06: qty 50

## 2017-06-06 MED ORDER — DICYCLOMINE HCL 20 MG PO TABS: 20.0000 mg | ORAL_TABLET | Freq: Two times a day (BID) | ORAL | 0 refills | Status: DC

## 2017-06-06 MED ORDER — SODIUM CHLORIDE 0.9 % IV BOLUS (SEPSIS)
500.0000 mL | Freq: Once | INTRAVENOUS | Status: AC
  Administered 2017-06-06: 500 mL via INTRAVENOUS

## 2017-06-06 NOTE — ED Provider Notes (Signed)
[facility] DEPT Provider Note   CSN: [id number] Arrival date & time: 06/06/17  0800     History   Chief Complaint Chief Complaint  Patient presents with  . Abdominal Pain    HPI Michele Owens is a 57 y.o. female.  She presents by ambulance complaining of 3 days of abdominal pain.  Diffuse 10 out of 10 sharp and cramping in nature.  She states no nausea no vomiting no diarrhea no constipation.  She denies ever having this had this before.  There are no urinary symptoms.  She is on chronic pain medicine and benzos for various maladies.  She has COPD and is on 2 L home oxygen, just stopped smoking in January.  She states she continues to have cough and chest pain and also complains of left knee pain and fluid on the knee.  The history is provided by the patient.  Abdominal Pain   This is a new problem. The current episode started more than 2 days ago. The problem occurs constantly. The problem has not changed since onset.The pain is located in the generalized abdominal region. The quality of the pain is sharp and cramping. The pain is at a severity of 10/10. Associated symptoms include arthralgias. Pertinent negatives include fever, diarrhea, nausea, vomiting, constipation, dysuria, hematuria and headaches. Nothing aggravates the symptoms. Nothing relieves the symptoms.    Past Medical History:  Diagnosis Date  . Anxiety   . Arthritis   . Asthma   . Chronic diastolic CHF (congestive heart failure) ([place])   . COPD (chronic obstructive pulmonary disease) (HCC)    Uses Oxygen at night  . Depression   . Diabetes mellitus without complication ([employer])   . Dyspnea    occ  . Family history of thyroid cancer   . GERD (gastroesophageal reflux disease)   . Gout   . Headache    migraines  . History of DVT of lower extremity   . History of nuclear stress test    Myoview 2/17:  Low risk stress nuclear study with a small, moderate intensity, partially reversible  inferior lateral defect consistent with small prior infarct and minimal peri-infarct ischemia; EF 68 with normal wall motion  . Hypertension   . Pneumonia   . Thyroid cancer ([place]) 09/23/2016    Patient Active Problem List   Diagnosis Date Noted  . Genetic testing 02/27/2017  . Family history of thyroid cancer   . Hypomagnesemia 10/03/2016  . Boil, breast 10/03/2016  . Atypical chest pain   . HLD (hyperlipidemia) 09/23/2016  . COPD (chronic obstructive pulmonary disease) ([place]) 09/23/2016  . Gout 09/23/2016  . DVT (deep vein thrombosis) in pregnancy ([place]) 09/23/2016  . Thyroid cancer ([place]) 09/23/2016  . Hypocalcemia 09/23/2016  . AKI (acute kidney injury) ([place]) 09/23/2016  . 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 Acute pulmonary edema (HCC)   . Acute hypoxemic respiratory failure ([place])   . Acute on chronic respiratory failure with hypoxemia ([place])   . Ventilator dependent ([place])   . Respiratory failure ([place]) 07/16/2016  . Chronic combined systolic (congestive) and diastolic (congestive) heart failure ([place]) 02/10/2016  . Dyspnea   . CAP (community acquired pneumonia) 01/23/2016  . 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 Pulmonary hypertension ([place]) 01/23/2016  . Sepsis ([place]) 01/23/2016  . Obesity hypoventilation syndrome ([place]) 08/26/2015  . Chest pain 06/16/2015  . Dyslipidemia associated with type 2 diabetes mellitus ([place]) 04/24/2015  . Leukocytosis 04/24/2015  .  Controlled type 2 diabetes mellitus without complication, without long-term current use of insulin ([place]) 04/24/2015  . Anxiety 04/24/2015  . Benign essential HTN 04/24/2015  . Normocytic anemia 04/24/2015  . Asthma with COPD with exacerbation ([place]) 04/24/2015  . OSA (obstructive sleep apnea) 05/10/2012  . Tobacco abuse 07/04/2011    Past Surgical History:  Procedure Laterality Date  . BUNIONECTOMY Bilateral   . CARDIAC CATHETERIZATION N/A 06/23/2015   Procedure:  Right/Left Heart Cath and Coronary Angiography;  Surgeon: [name], MD;  Location: [facility];  Service: Cardiovascular;  Laterality: N/A;  . COLONOSCOPY WITH PROPOFOL N/A 12/15/2015   Procedure: COLONOSCOPY WITH PROPOFOL;  Surgeon: [name], MD;  Location: [facility];  Service: Endoscopy;  Laterality: N/A;  . THYROIDECTOMY  09/19/2016  . THYROIDECTOMY N/A 09/19/2016   Procedure: TOTAL THYROIDECTOMY;  Surgeon: [name], MD;  Location: [facility];  Service: General;  Laterality: N/A;  . TONSILLECTOMY    . TOTAL ABDOMINAL HYSTERECTOMY  07/14/10    OB History    No data available       Home Medications    Prior to Admission medications   Medication Sig Start Date End Date Taking? Authorizing Provider  acetaminophen (TYLENOL) 325 MG tablet Take 1 tablet (325 mg total) by mouth every 6 (six) hours as needed for mild pain or headache. Caution: Acetaminophen from all sources not to exceed greater than 3 g per day. Patient not taking: Reported on 04/17/2017 10/04/16   Velvet Bathe, MD  acetaminophen (TYLENOL) 500 MG tablet Take 500 mg by mouth every 6 (six) hours as needed for headache.    [provider]  albuterol (ACCUNEB) 1.25 MG/3ML nebulizer solution Take 1 ampule by nebulization 2 (two) times daily.    [provider]  albuterol (PROVENTIL HFA;VENTOLIN HFA) 108 (90 BASE) MCG/ACT inhaler Inhale 1-2 puffs into the lungs every 6 (six) hours as needed for wheezing. 04/09/12   Billy Fischer, MD  allopurinol (ZYLOPRIM) 100 MG tablet Take 100 mg by mouth daily.    [provider]  aspirin-acetaminophen-caffeine (EXCEDRIN MIGRAINE) [phone] MG tablet Take 2 tablets by mouth daily as needed for headache.    [provider]  benzonatate (TESSALON) 200 MG capsule Take 1 capsule (200 mg total) by mouth 3 (three) times daily as needed for cough. Patient not taking: Reported on 04/17/2017 07/24/16   Lavina Hamman, MD  budesonide-formoterol [facility])  80-4.5 MCG/ACT inhaler Inhale 2 puffs into the lungs 2 (two) times daily. 07/24/16   Lavina Hamman, MD  calcitRIOL (ROCALTROL) 0.5 MCG capsule Take 1 capsule (0.5 mcg total) by mouth daily. 09/26/16   Hongalgi, [name], MD  calcium carbonate (TUMS) 500 MG chewable tablet Chew 4 tablets (800 mg of elemental calcium total) by mouth 4 (four) times daily. 09/25/16   Hongalgi, [name], MD  carvedilol (COREG) 6.25 MG tablet TAKE ONE TABLET BY MOUTH TWICE DAILY WITH A MEAL 03/23/17   [name], MD  clonazePAM (KLONOPIN) 2 MG tablet Take 1 tablet (2 mg total) by mouth 2 (two) times daily. 01/27/16   Mikhail, Velta Addison, DO  diclofenac sodium (VOLTAREN) 1 % GEL Apply 2 g topically 4 (four) times daily as needed (for pain).     [provider]  dicyclomine (BENTYL) 20 MG tablet Take 1 tablet (20 mg total) by mouth 2 (two) times daily as needed (abdominal pain/cramping). 11/28/15   Antonietta Breach, PA-C  EPINEPHrine 0.3 mg/0.3 mL IJ SOAJ injection Inject 0.3 mg  into the muscle daily as needed (allergic reaction).    [provider]  FEROSUL 325 (65 Fe) MG tablet Take 325 mg by mouth daily. 03/22/17   [provider]  furosemide (LASIX) 40 MG tablet Take 1 tablet (40 mg total) by mouth daily. Patient taking differently: Take 40 mg by mouth 2 (two) times daily.  09/25/16   Hongalgi, [name], MD  HYDROcodone-acetaminophen (NORCO/VICODIN) 5-325 MG tablet Take 1-2 tablets by mouth every 4 (four) hours as needed for moderate pain. 09/20/16   [name], MD  ipratropium-albuterol (DUONEB) 0.5-2.5 (3) MG/3ML SOLN Use one vial in Nebulizer four times daily as directed. 04/05/17   [provider]  levofloxacin (LEVAQUIN) 250 MG tablet Take 1 tablet (250 mg total) by mouth daily. 04/17/17   Pisciotta, Elmyra Ricks, PA-C  levothyroxine (SYNTHROID, LEVOTHROID) 200 MCG tablet Take 200 mcg by mouth daily before breakfast.    [provider]  lisinopril (PRINIVIL,ZESTRIL) 5 MG tablet Take 1  tablet (5 mg total) by mouth daily. Needs office visit 04/25/17   [name], MD  loratadine (CLARITIN) 10 MG tablet Take 10 mg by mouth daily as needed for allergies.     [provider]  metFORMIN (GLUCOPHAGE) 500 MG tablet Take 1 tablet (500 mg total) by mouth 2 (two) times daily with a meal. 12/11/14   [name], MD  mirtazapine (REMERON) 15 MG tablet Take 15 mg by mouth at bedtime. 11/16/14   [provider]  nicotine (NICODERM CQ - DOSED IN MG/24 HOURS) 14 mg/24hr patch Place 1 patch (14 mg total) onto the skin daily. 07/25/16   Lavina Hamman, MD  nystatin cream (MYCOSTATIN) APPLY TO THE AFFECTED AREA(S) TWICE DAILY AS NEEDED 04/13/17   [provider]  omeprazole (PRILOSEC) 40 MG capsule Take 40 mg by mouth daily. 04/13/15   [provider]  potassium chloride SA (K-DUR,KLOR-CON) 20 MEQ tablet Take 2 tablets (40 mEq total) by mouth daily. 08/01/16   [name], MD  pravastatin (PRAVACHOL) 40 MG tablet Take 40 mg by mouth every evening.  03/29/15   [provider]  predniSONE (DELTASONE) 50 MG tablet Take 1 tablet daily with breakfast 04/17/17   Pisciotta, Elmyra Ricks, PA-C  QC STOOL SOFTENER PLS LAXATIVE 8.6-50 MG tablet Take 1 tablet by mouth at bedtime. 04/11/17   [provider]  SYNTHROID 100 MCG tablet Take 1 tablet (100 mcg total) by mouth daily. Patient not taking: Reported on 04/17/2017 09/20/16   [name], MD  tiotropium (SPIRIVA) 18 MCG inhalation capsule Place 18 mcg into inhaler and inhale daily.    [provider]  TUSSIN DM 100-10 MG/5ML liquid take 2 teaspoonfuls by mouth every 4-6 hours as needed for cough; Do not exceed 6 doses in a 24-hour period 02/19/17   [provider]    Family History Family History  Problem Relation Age of Onset  . Cancer Father        thought to be due to exposure to concrete  . Diabetes Mother   . Thyroid cancer Mother        dx in her 84s-60s  . Cancer Maternal  Uncle        2 uncles with cancer NOS  . Brain cancer Paternal Aunt   . Cancer Cousin        maternal first cousin - NOS  . Cancer Cousin        maternal first cousin - NOS    Social History  Social History   Tobacco Use  . Smoking status: Former Smoker    Packs/day: 0.50    Years: 41.00    Pack years: 20.50    Types: Cigarettes    Last attempt to quit: 07/2016    Years since quitting: 0.9  . Smokeless tobacco: Never Used  . Tobacco comment: Starting to Wean Off -- using Nicotine Patch  Substance Use Topics  . Alcohol use: Yes    Alcohol/week: 0.0 oz    Comment: ? none now  . Drug use: Yes    Types: Marijuana    Comment: none for long time     Allergies   Bee venom; Ibuprofen; Lamisil [terbinafine]; and Nsaids   Review of Systems Review of Systems  Constitutional: Negative for chills and fever.  HENT: Negative for ear pain and sore throat.   Eyes: Negative for pain, redness and visual disturbance.  Respiratory: Positive for cough and shortness of breath (baseline).   Cardiovascular: Positive for chest pain. Negative for palpitations.  Gastrointestinal: Positive for abdominal pain. Negative for blood in stool, constipation, diarrhea, nausea and vomiting.  Genitourinary: Negative for dysuria and hematuria.  Musculoskeletal: Positive for arthralgias, back pain and joint swelling.  Skin: Negative for color change and rash.  Neurological: Positive for light-headedness. Negative for seizures and headaches.  All other systems reviewed and are negative.    Physical Exam Updated Vital Signs BP (!) 164/88 (BP Location: Right Arm)   Pulse 78   Temp 98.4 F (36.9 C) (Oral)   Resp 18   SpO2 93%   Physical Exam  Constitutional: She appears well-developed and well-nourished. No distress.  HENT:  Head: Normocephalic and atraumatic.  Mouth/Throat: Oropharynx is clear and moist.  Eyes: Conjunctivae and EOM are normal. Pupils are equal, round, and reactive to light.    Neck: Neck supple.  Cardiovascular: Normal rate, regular rhythm and normal heart sounds.  No murmur heard. Pulmonary/Chest: Effort normal. No respiratory distress. She has wheezes. She has no rhonchi.  Abdominal: Soft. Bowel sounds are normal. There is generalized tenderness. There is no rigidity and no guarding.  Musculoskeletal: She exhibits no edema.  Neurological: She is alert.  Skin: Skin is warm and dry. Capillary refill takes less than 2 seconds. No rash noted.  Psychiatric: She has a normal mood and affect.  Nursing note and vitals reviewed.    ED Treatments / Results  Labs (all labs ordered are listed, but only abnormal results are displayed) Labs Reviewed  URINALYSIS, ROUTINE W REFLEX MICROSCOPIC - Abnormal; Notable for the following components:      Result Value   Specific Gravity, Urine 1.031 (*)    All other components within normal limits  COMPREHENSIVE METABOLIC PANEL - Abnormal; Notable for the following components:   Chloride 97 (*)    CO2 33 (*)    Glucose, Bld 104 (*)    All other components within normal limits  CBC WITH DIFFERENTIAL/PLATELET - Abnormal; Notable for the following components:   WBC 13.0 (*)    Neutro Abs 9.6 (*)    All other components within normal limits  CBG MONITORING, ED - Abnormal; Notable for the following components:   Glucose-Capillary 106 (*)    All other components within normal limits  LIPASE, BLOOD  CBG MONITORING, ED  I-STAT TROPONIN, ED    EKG  EKG Interpretation  Date/Time:  Wednesday June 06 2017 08:48:32 EST Ventricular Rate:  74 PR Interval:    QRS Duration: 78 QT Interval:  369  QTC Calculation: 410 R Axis:   45 Text Interpretation:  Sinus arrhythmia Ventricular premature complex Borderline T abnormalities, anterior leads similar to prior Confirmed by Aletta Edouard [phone]) on 06/06/2017 11:08:54 AM       Radiology Ct Abdomen Pelvis W Contrast  Result Date: 06/06/2017 CLINICAL DATA:  Abdominal pain  and diarrhea for 3 days. EXAM: CT ABDOMEN AND PELVIS WITH CONTRAST TECHNIQUE: Multidetector CT imaging of the abdomen and pelvis was performed using the standard protocol following bolus administration of intravenous contrast. CONTRAST:  100 mL Isovue 300 COMPARISON:  11/28/2015 FINDINGS: Lower chest: Mild atelectasis and/or scarring in the lung bases. No pleural effusion. Hepatobiliary: Diffusely decreased attenuation of the liver consistent with steatosis. No focal liver abnormality identified. Unremarkable gallbladder. No biliary dilatation. Pancreas: Unremarkable. Spleen: Unremarkable. Adrenals/Urinary Tract: Unremarkable adrenal glands. No evidence of renal mass, calculi, or hydronephrosis. Unremarkable bladder. Stomach/Bowel: The stomach is within normal limits. There is no evidence of bowel obstruction or inflammation. The appendix is unremarkable. Vascular/Lymphatic: Mild abdominal aortic atherosclerosis without aneurysm. No enlarged lymph nodes. Reproductive: Prior hysterectomy. Unchanged 1.9 cm right ovarian cyst. Unremarkable left ovary. Other: No intraperitoneal free fluid.  No abdominal wall hernia. Musculoskeletal: No acute osseous abnormality or suspicious osseous lesion. Lower thoracic and lower lumbar spondylosis. IMPRESSION: 1. No acute abnormality identified in the abdomen or pelvis. 2. Hepatic steatosis. 3.  Aortic Atherosclerosis (ICD10-I70.0). Electronically Signed   By: Logan Bores M.D.   On: 06/06/2017 11:12    Procedures Procedures (including critical care time)  Medications Ordered in ED Medications  morphine 4 MG/ML injection 4 mg (not administered)  sodium chloride 0.9 % bolus 500 mL (not administered)     Initial Impression / Assessment and Plan / ED Course  I have reviewed the triage vital signs and the nursing notes.  Pertinent labs & imaging results that were available during my care of the patient were reviewed by me and considered in my medical decision making (see  chart for details).  Clinical Course as of Jun 06 1753  Wed Jun 06, 2017  8242  EKG Interpretation  Date/Time:   06/06/2017 Ventricular Rate:   67 PR Interval:   178 QRS Duration:  78 QT Interval:  369 QTC Calculation:  410 R Axis:     Text Interpretation:  sinus arrhythmia, t wave inversions anterior Similar to 9/18       [MB]  1135 Reviewed results with patient.  CT is unremarkable.  She is asking for diet ginger ale graham crackers and peanut butter and then states she will call for a ride.  [MB]  1212 Tolerated p.o. without difficulty.  Looks very comfortable.  [MB]    Clinical Course User Index [MB] Hayden Rasmussen, MD      Final Clinical Impressions(s) / ED Diagnoses   Final diagnoses:  Generalized abdominal pain  Left knee pain, unspecified chronicity    ED Discharge Orders        Ordered    dicyclomine (BENTYL) 20 MG tablet  2 times daily     06/06/17 1146       Hayden Rasmussen, MD 06/06/17 1756

## 2017-06-06 NOTE — ED Triage Notes (Signed)
Per EMS pt from home complains of abd pain, diarrhea x 3 days. Pt took clonazepam this morning for anxiety. Pt also complains of cough since January when she stopped smoking. Pt has hx of DM and HTN. Lung sounds clear for EMS.  BP 150 palpated HR 80 CBG 121

## 2017-06-06 NOTE — Discharge Instructions (Signed)
You were evaluated in the emergency department for abdominal pain.  Your CAT scan and blood work did not show an obvious cause of your symptoms.  You were able to take food and drink by mouth here and we recommend that you follow-up with your primary care doctor for further evaluation.  I am prescribing you Bentyl which may help with some abdominal cramping.  It looks like he had been on this medicine in the past.  You also were complaining of some left knee swelling and you will need to follow-up with orthopedic doctors for evaluation and possible drainage.  There is no obvious signs of any infection there.

## 2017-06-28 ENCOUNTER — Ambulatory Visit: Payer: Medicaid Other | Admitting: Podiatry

## 2017-06-28 ENCOUNTER — Encounter: Payer: Self-pay | Admitting: Podiatry

## 2017-06-28 DIAGNOSIS — M722 Plantar fascial fibromatosis: Secondary | ICD-10-CM | POA: Diagnosis not present

## 2017-06-28 DIAGNOSIS — L600 Ingrowing nail: Secondary | ICD-10-CM

## 2017-06-28 MED ORDER — DOXYCYCLINE HYCLATE 100 MG PO TABS: 100.0000 mg | ORAL_TABLET | Freq: Two times a day (BID) | ORAL | 0 refills | Status: DC

## 2017-06-28 NOTE — Patient Instructions (Signed)
Pre-Operative Instructions  Congratulations, you have decided to take an important step towards improving your quality of life.  You can be assured that the doctors and staff at [facility] will be with you every step of the way.  Here are some important things you should know:  1. Plan to be at the surgery center/hospital at least 1 (one) hour prior to your scheduled time, unless otherwise directed by the surgical center/hospital staff.  You must have a responsible adult accompany you, remain during the surgery and drive you home.  Make sure you have directions to the surgical center/hospital to ensure you arrive on time. 2. If you are having surgery at [facility] or [facility] hospitals, you will need a copy of your medical history and physical form from your family physician within one month prior to the date of surgery. We will give you a form for your primary physician to complete.  3. We make every effort to accommodate the date you request for surgery.  However, there are times where surgery dates or times have to be moved.  We will contact you as soon as possible if a change in schedule is required.   4. No aspirin/ibuprofen for one week before surgery.  If you are on aspirin, any non-steroidal anti-inflammatory medications (Mobic, Aleve, Ibuprofen) should not be taken seven (7) days prior to your surgery.  You make take Tylenol for pain prior to surgery.  5. Medications - If you are taking daily heart and blood pressure medications, seizure, reflux, allergy, asthma, anxiety, pain or diabetes medications, make sure you notify the surgery center/hospital before the day of surgery so they can tell you which medications you should take or avoid the day of surgery. 6. No food or drink after midnight the night before surgery unless directed otherwise by surgical center/hospital staff. 7. No alcoholic beverages 24-hours prior to surgery.  No smoking 24-hours prior or 24-hours after  surgery. 8. Wear loose pants or shorts. They should be loose enough to fit over bandages, boots, and casts. 9. Don't wear slip-on shoes. Sneakers are preferred. 10. Bring your boot with you to the surgery center/hospital.  Also bring crutches or a walker if your physician has prescribed it for you.  If you do not have this equipment, it will be provided for you after surgery. 11. If you have not been contacted by the surgery center/hospital by the day before your surgery, call to confirm the date and time of your surgery. 12. Leave-time from work may vary depending on the type of surgery you have.  Appropriate arrangements should be made prior to surgery with your employer. 13. Prescriptions will be provided immediately following surgery by your doctor.  Fill these as soon as possible after surgery and take the medication as directed. Pain medications will not be refilled on weekends and must be approved by the doctor. 14. Remove nail polish on the operative foot and avoid getting pedicures prior to surgery. 15. Wash the night before surgery.  The night before surgery wash the foot and leg well with water and the antibacterial soap provided. Be sure to pay special attention to beneath the toenails and in between the toes.  Wash for at least three (3) minutes. Rinse thoroughly with water and dry well with a towel.  Perform this wash unless told not to do so by your physician.  Enclosed: 1 Ice pack (please put in freezer the night before surgery)   1 Hibiclens skin cleaner     Pre-op instructions  If you have any questions regarding the instructions, please do not hesitate to call our office.  [place]: [address], [place], [place] [address] -- [phone]  [place]: [address] [place], [place] [address] -- [phone]  [place]: [address]  [place], [place] [address] -- [phone]  [place]: [address], [address], [place],  [address] -- [phone]  Website:  [email or web address] 

## 2017-06-28 NOTE — Progress Notes (Signed)
She presents today for chief complaint of painful ingrown toenails to the hallux bilateral second digit right and bilateral heel pain.  She states that she would like to have these removed.  States her blood sugars doing much better.  She says I think this was ingrown and I am tired of getting sore.  Objective: Vital signs are stable she is alert and oriented x3 pulses are palpable.  Sharp incurvated nail margins to the hallux right into the second digit of the right foot.  These are mildly tender on palpation.  There is mild paronychia associated with the hallux right.  Left hallux does demonstrate some small spicules of nails that she would like to have removed.  She also has pain on palpation medial calcaneal tubercles bilateral.  Assessment: Pain in limb secondary to paronychia hallux right ingrown nail second digit right and nail spicules hallux left plantar fasciitis bilateral.  Plan: She would like to have these surgically removed but she wants to go to sleep for this.  She would like to go to the transfer especially surgical center we will go ahead and get all this set up for her.  We are requesting medical clearance we will perform surgical matrixectomy's to the hallux bilateral and second digit right total nail avulsions.  Also we will inject while she is asleep her bilateral heels with cortisone.  Prescribed doxycycline today for her.  Mild paronychia she will start soaking.  She will follow-up with me in the near future for surgical intervention otherwise I will follow-up with her in the office to reevaluate within the next week or 2.

## 2017-06-29 ENCOUNTER — Telehealth: Payer: Self-pay | Admitting: *Deleted

## 2017-06-29 MED ORDER — DOXYCYCLINE HYCLATE 100 MG PO CAPS: 100.0000 mg | ORAL_CAPSULE | Freq: Two times a day (BID) | ORAL | 0 refills | Status: DC

## 2017-06-29 MED ORDER — DOXYCYCLINE HYCLATE 100 MG PO TABS: 100.0000 mg | ORAL_TABLET | Freq: Two times a day (BID) | ORAL | 0 refills | Status: DC

## 2017-06-29 NOTE — Telephone Encounter (Signed)
I spoke with pt and informed that the rx was called in as a tablet and was more expensive and difficult to get, that I would change it to the [facility] on [address] that we have as the pharmacy and she states that was not the pharmacy she used. Pt could not read off the pharmacy for the [facility] she wanted, and gave me [id number] as the address. I asked pt if the [facility] was where she wanted the medication and she 1st said no, then I read off the entire address and pharmacy number she said she had given be the wrong number she had given me the store number. I escribed the Doxycycline 100mg  capsules bid #20 to the [place].

## 2017-06-29 NOTE — Addendum Note (Signed)
Addended by: Clovis Riley E on: 06/29/2017 11:24 AM   Modules accepted: Orders

## 2017-06-29 NOTE — Telephone Encounter (Signed)
Pt called states her medication was not called in and her foot was hurting, and if she had known it was not going to be called in she would have gotten a written one, and she doesn't have a car and will have to get some one to take her to get the prescription. Pt called again and states they are messing with me 1st they called saying the medicine was ready then they called and states it is not ready, call the pharmacy so she can get this woman to go pick it up, and [facility] will have it ready.

## 2017-07-12 ENCOUNTER — Encounter: Payer: Self-pay | Admitting: *Deleted

## 2017-07-13 ENCOUNTER — Telehealth: Payer: Self-pay

## 2017-07-13 ENCOUNTER — Telehealth: Payer: Self-pay | Admitting: Podiatry

## 2017-07-13 NOTE — Telephone Encounter (Signed)
Attempted to return patient's call, VM and gospel music was heard when trying to call, no ability to leave a message.

## 2017-07-13 NOTE — Telephone Encounter (Signed)
I'm scheduled to have surgery did on my toes on 19 April. When I took a shower last night, I took one this morning and I got two left of the infection pill he gave me. Says to take one by mouth twice daily for ten days but I've been wearing a band-aid. 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 I use that foot gel stuff and I've been blow drying my feet cause when I come out of the shower I blow dry myself down real good. But anyhow, can you call me back at [phone]. It's nothing but you know it was just bleeding, you know, just bleeding, my big toe, the one he pulled the pus out of. I cannot pronounce my foot doctor's name so I'm going to spell it, H as in [place], Y A T T. Also, tell him the surgery people haven't called me yet. Well, today is just the 5 th. I guess they'll get around to calling me but anyhow I got their name. But uh, do you want me to keep the band-aid off my feet or what? I didn't touch my nail or none of that. I will talk to you later. Alrighty. Bye bye. Have a good day.

## 2017-07-13 NOTE — Telephone Encounter (Signed)
Time was 4:11pm, attempted to return patient's call again to answer her questions. Unable to leave a voice mail

## 2017-07-24 ENCOUNTER — Other Ambulatory Visit: Payer: Self-pay | Admitting: Podiatry

## 2017-07-24 ENCOUNTER — Telehealth: Payer: Self-pay | Admitting: Podiatry

## 2017-07-24 MED ORDER — CLINDAMYCIN HCL 150 MG PO CAPS: 150.0000 mg | ORAL_CAPSULE | Freq: Three times a day (TID) | ORAL | 0 refills | Status: DC

## 2017-07-24 MED ORDER — HYDROCODONE-ACETAMINOPHEN 10-325 MG PO TABS: 1.0000 | ORAL_TABLET | Freq: Four times a day (QID) | ORAL | 0 refills | Status: AC | PRN

## 2017-07-24 MED ORDER — PROMETHAZINE HCL 25 MG PO TABS: 25.0000 mg | ORAL_TABLET | Freq: Three times a day (TID) | ORAL | 0 refills | Status: DC | PRN

## 2017-07-24 NOTE — Telephone Encounter (Signed)
Left message informing pt the medication Dr. Milinda Pointer had prescribed today could be taken with a small amount of food.

## 2017-07-24 NOTE — Telephone Encounter (Signed)
Hi, this is Ms. Quesada. I was, okay they supposed to send me a letter in the mail of what medication I can take. Now my thyroid medicine it don't bother me cause well I take 2 pills. I have to take them early in the morning so I've been taking them at like 5:00 am and I've been waiting like 30-1 hour before I take my other medication. Okay, then I just got the blue pills they got caffeine in them and they are 40 mg and I take 2 of them because I've been having migraine headaches real real bad, okay that's just as needed. Now this is [name]. The date of my birth is [birth year]. Faythe Ghee, you can reach me back at [phone]. Now she was supposed to be sending me the medication that I suppose to be taking. Just like I told amanda, the medicine that I'm taking and if I take that, cause y'all will have me, y'all will do the procedure at 12. Okay, I'm just telling you what goes on with my body. If every pill, every pill bottle got on there eat. Okay so if I don't have nothing to eat the thyroid medicine won't bother me but them other pills will make me sick on my stomach and I just be throwing up all morning until I come there. So I don't know what you all want me to do. I know I don't supposed to eat until after 12, I do know that, but like I said I have not got that paper yet that's got my, and also you can call Dr. Jonelle Sidle and them my family doctor. Their number is [phone], that's Dr. Jonelle Sidle, my family doctor. They have everything that I take and they even have my new medicine. I was on my thyroid 200 now they done moved it up to 200 and 214 because I'm at high risk okay. When I called yesterday I had went and they said well 183 is triglyceride, so I don't know what that means. But anyhow, you can call them and those girls can tell you about me, so you would normally talk to [place] or the other lady, lord I forgot her name and the other 2 girls don't know me that good. Just get the ones that know me real  real good and do my everything when I come up in there and uh I'm allergic to ibuprofen. And I have my bee sting thing in my pocketbook that I keep. So like I said, the medicine is going to make me sick because y'all said for me to come and my appointment is at 67 and they are going to do this procedure at 12. I mean come on now, I thought maybe it was going to be like early in the morning. So it won't make me sick but if you want me to take a fluid pill then they are going to have to put something between my legs cause I don't want to wet my bed while y'all are doing the surgery cause I take fluid pills. So I'm letting you know. Please give them a call, please. I'm going to go to my mailbox today and see that I see anything. Yesterday, I been told her to send me a list and highlight it. I've forgotten her name and I have not received nothing yet so I hope it be in the mail today.

## 2017-07-25 ENCOUNTER — Telehealth: Payer: Self-pay | Admitting: Podiatry

## 2017-07-25 NOTE — Telephone Encounter (Signed)
I attempted to call Dr. Leontine Locket office back and Teilichia answered the phone. I told her why I was calling and she asked that I re-fax that letter as Dr. Jonelle Sidle would be in this afternoon and she would get it signed and send back with pt's last lab work. I told her I would get it faxed to her shortly.

## 2017-07-25 NOTE — Telephone Encounter (Signed)
Called and left a message at the number of [phone] to call me back to confirm if Michele Owens is a mutual pt and if so the status of medical clearance for pt's surgery that is scheduled for this Friday, 19 April with Dr. Milinda Pointer. Asked for them to return my call to [phone] and to ask for me.

## 2017-07-27 ENCOUNTER — Encounter: Payer: Self-pay | Admitting: Podiatry

## 2017-07-27 DIAGNOSIS — M722 Plantar fascial fibromatosis: Secondary | ICD-10-CM | POA: Diagnosis not present

## 2017-07-27 DIAGNOSIS — L600 Ingrowing nail: Secondary | ICD-10-CM | POA: Diagnosis not present

## 2017-07-30 ENCOUNTER — Telehealth: Payer: Self-pay | Admitting: *Deleted

## 2017-07-30 NOTE — Telephone Encounter (Signed)
POST OP CALL-    1) General condition stated by the patient: Fine  2) Is the pt having pain? Some  3) Pain score:   4) Has the pt taken Rx'd medication?   5) Is the pain medication giving relief?  6) Any fever, chills, nausea, or vomiting?  7) Any shortness of breath or tightness in the calf?          8) Is the bandages clean, dry and intact?  9) Is the bandage excessively tight?  10) Is there excessive bleeding or drainage coming through the bandage?  11) Did you understand all of the post op instruction sheet given?  12) Any questions or concerns regarding post op care/recovery?    Confirmed POV appointment with patient    -Tried to question pt about how she was doing and she said she had someone on hold trying to set up her POV appt. I attempted to run through soaking instructions. Pt states she understood and would call me back if she had questions.

## 2017-08-02 ENCOUNTER — Ambulatory Visit (INDEPENDENT_AMBULATORY_CARE_PROVIDER_SITE_OTHER): Payer: Medicaid Other | Admitting: Podiatry

## 2017-08-02 VITALS — Temp 98.4°F

## 2017-08-02 DIAGNOSIS — E1151 Type 2 diabetes mellitus with diabetic peripheral angiopathy without gangrene: Secondary | ICD-10-CM

## 2017-08-02 DIAGNOSIS — L600 Ingrowing nail: Secondary | ICD-10-CM

## 2017-08-02 DIAGNOSIS — M722 Plantar fascial fibromatosis: Secondary | ICD-10-CM

## 2017-08-02 NOTE — Progress Notes (Signed)
  Subjective:  Patient ID: Michele Owens, female    DOB: 26-May-1960,  MRN: [id number]  Chief Complaint  Patient presents with  . Routine Post Op    dos 04.19.2019 Exc. Nail Perm. Hallux B/L and 2nd Rt; Inj. Plantar Fascia B/L    DOS: 07/27/17 Procedure: L hallux, R hallux, R 2nd toe permanent nail avulsion  57 y.o. female returns for post-op check. Denies N/V/F/Ch. Pain is controlled with current medications.  Objective:   General AA&O x3. Normal mood and affect.  Vascular Foot warm and well perfused.  Neurologic Gross sensation intact.  Dermatologic Skin healing well without signs of infection.  Orthopedic: No tto palpation noted about the surgical site.    Assessment & Plan:  Patient was evaluated and treated and all questions answered.  S/p bilateral toenail removals -Progressing as expected post-operatively. -Sutures: none. -Medications refilled: none -Ok to shower. -Dressed with abx ointment and band-aid. To be applied daily. -Foot redressed.  Follow up 2 weeks with Dr. Milinda Pointer. No follow-ups on file.

## 2017-08-03 ENCOUNTER — Telehealth: Payer: Self-pay | Admitting: Podiatry

## 2017-08-03 ENCOUNTER — Other Ambulatory Visit: Payer: Self-pay | Admitting: Podiatry

## 2017-08-03 NOTE — Telephone Encounter (Signed)
Pt called to see if its ok for her to blow dry her feet. Per Val I told pt that with her being a surgical pt, blow drying her feet will dry her skin out. Mrs. Valeri would like to have a refill on her pain med, shes out. Mrs. Rede also wants to know if its ok for her to use the Voltaren Gel on her feet.

## 2017-08-04 ENCOUNTER — Other Ambulatory Visit: Payer: Self-pay

## 2017-08-04 ENCOUNTER — Encounter (HOSPITAL_COMMUNITY): Payer: Self-pay

## 2017-08-04 ENCOUNTER — Emergency Department (HOSPITAL_COMMUNITY): Payer: Medicaid Other

## 2017-08-04 ENCOUNTER — Inpatient Hospital Stay (HOSPITAL_COMMUNITY)
Admission: EM | Admit: 2017-08-04 | Discharge: 2017-08-06 | DRG: 190 | Disposition: A | Discharge status: Home / Self Care | Payer: Medicaid Other | Attending: Internal Medicine | Admitting: Internal Medicine

## 2017-08-04 DIAGNOSIS — F419 Anxiety disorder, unspecified: Secondary | ICD-10-CM | POA: Diagnosis present

## 2017-08-04 DIAGNOSIS — K219 Gastro-esophageal reflux disease without esophagitis: Secondary | ICD-10-CM | POA: Diagnosis present

## 2017-08-04 DIAGNOSIS — I10 Essential (primary) hypertension: Secondary | ICD-10-CM | POA: Diagnosis not present

## 2017-08-04 DIAGNOSIS — M109 Gout, unspecified: Secondary | ICD-10-CM | POA: Diagnosis present

## 2017-08-04 DIAGNOSIS — Z7951 Long term (current) use of inhaled steroids: Secondary | ICD-10-CM | POA: Diagnosis not present

## 2017-08-04 DIAGNOSIS — G4733 Obstructive sleep apnea (adult) (pediatric): Secondary | ICD-10-CM

## 2017-08-04 DIAGNOSIS — J9621 Acute and chronic respiratory failure with hypoxia: Secondary | ICD-10-CM | POA: Diagnosis present

## 2017-08-04 DIAGNOSIS — R079 Chest pain, unspecified: Secondary | ICD-10-CM

## 2017-08-04 DIAGNOSIS — E119 Type 2 diabetes mellitus without complications: Secondary | ICD-10-CM

## 2017-08-04 DIAGNOSIS — Z85850 Personal history of malignant neoplasm of thyroid: Secondary | ICD-10-CM | POA: Diagnosis not present

## 2017-08-04 DIAGNOSIS — Z87891 Personal history of nicotine dependence: Secondary | ICD-10-CM | POA: Diagnosis not present

## 2017-08-04 DIAGNOSIS — E662 Morbid (severe) obesity with alveolar hypoventilation: Secondary | ICD-10-CM | POA: Diagnosis present

## 2017-08-04 DIAGNOSIS — Z91030 Bee allergy status: Secondary | ICD-10-CM

## 2017-08-04 DIAGNOSIS — D649 Anemia, unspecified: Secondary | ICD-10-CM | POA: Diagnosis present

## 2017-08-04 DIAGNOSIS — Z79899 Other long term (current) drug therapy: Secondary | ICD-10-CM

## 2017-08-04 DIAGNOSIS — E872 Acidosis: Secondary | ICD-10-CM | POA: Diagnosis present

## 2017-08-04 DIAGNOSIS — I110 Hypertensive heart disease with heart failure: Secondary | ICD-10-CM | POA: Diagnosis present

## 2017-08-04 DIAGNOSIS — J9622 Acute and chronic respiratory failure with hypercapnia: Secondary | ICD-10-CM | POA: Diagnosis present

## 2017-08-04 DIAGNOSIS — E890 Postprocedural hypothyroidism: Secondary | ICD-10-CM | POA: Diagnosis present

## 2017-08-04 DIAGNOSIS — I272 Pulmonary hypertension, unspecified: Secondary | ICD-10-CM | POA: Diagnosis present

## 2017-08-04 DIAGNOSIS — R0789 Other chest pain: Secondary | ICD-10-CM | POA: Diagnosis present

## 2017-08-04 DIAGNOSIS — Z9981 Dependence on supplemental oxygen: Secondary | ICD-10-CM | POA: Diagnosis not present

## 2017-08-04 DIAGNOSIS — Z808 Family history of malignant neoplasm of other organs or systems: Secondary | ICD-10-CM | POA: Diagnosis not present

## 2017-08-04 DIAGNOSIS — Z833 Family history of diabetes mellitus: Secondary | ICD-10-CM

## 2017-08-04 DIAGNOSIS — I361 Nonrheumatic tricuspid (valve) insufficiency: Secondary | ICD-10-CM | POA: Diagnosis not present

## 2017-08-04 DIAGNOSIS — Z79890 Hormone replacement therapy (postmenopausal): Secondary | ICD-10-CM | POA: Diagnosis not present

## 2017-08-04 DIAGNOSIS — E1169 Type 2 diabetes mellitus with other specified complication: Secondary | ICD-10-CM

## 2017-08-04 DIAGNOSIS — J441 Chronic obstructive pulmonary disease with (acute) exacerbation: Principal | ICD-10-CM | POA: Diagnosis present

## 2017-08-04 DIAGNOSIS — Z6841 Body Mass Index (BMI) 40.0 and over, adult: Secondary | ICD-10-CM | POA: Diagnosis not present

## 2017-08-04 DIAGNOSIS — I5033 Acute on chronic diastolic (congestive) heart failure: Secondary | ICD-10-CM | POA: Diagnosis present

## 2017-08-04 DIAGNOSIS — Z888 Allergy status to other drugs, medicaments and biological substances status: Secondary | ICD-10-CM

## 2017-08-04 DIAGNOSIS — E785 Hyperlipidemia, unspecified: Secondary | ICD-10-CM | POA: Diagnosis present

## 2017-08-04 DIAGNOSIS — J449 Chronic obstructive pulmonary disease, unspecified: Secondary | ICD-10-CM

## 2017-08-04 DIAGNOSIS — Z886 Allergy status to analgesic agent status: Secondary | ICD-10-CM

## 2017-08-04 LAB — BASIC METABOLIC PANEL
Anion gap: 8 (ref 5–15)
BUN: 17 mg/dL (ref 6–20)
CHLORIDE: 100 mmol/L — AB (ref 101–111)
CO2: 30 mmol/L (ref 22–32)
CREATININE: 0.65 mg/dL (ref 0.44–1.00)
Calcium: 8.8 mg/dL — ABNORMAL LOW (ref 8.9–10.3)
GFR calc Af Amer: >60 mL/min (ref >60)
GFR calc non Af Amer: >60 mL/min (ref >60)
Glucose, Bld: 112 mg/dL — ABNORMAL HIGH (ref 65–99)
POTASSIUM: 3.6 mmol/L (ref 3.5–5.1)
SODIUM: 138 mmol/L (ref 135–145)

## 2017-08-04 LAB — DIFFERENTIAL
BASOS ABS: 0 10*3/uL (ref 0.0–0.1)
Basophils Relative: 0 %
EOS ABS: 0.3 10*3/uL (ref 0.0–0.7)
EOS PCT: 2 %
LYMPHS ABS: 2.8 10*3/uL (ref 0.7–4.0)
Lymphocytes Relative: 16 %
MONOS PCT: 6 %
Monocytes Absolute: 1 10*3/uL (ref 0.1–1.0)
Neutro Abs: 13.7 10*3/uL — ABNORMAL HIGH (ref 1.7–7.7)
Neutrophils Relative %: 76 %

## 2017-08-04 LAB — CBC
HEMATOCRIT: 37.4 % (ref 36.0–46.0)
Hemoglobin: 11.7 g/dL — ABNORMAL LOW (ref 12.0–15.0)
MCH: 29 pg (ref 26.0–34.0)
MCHC: 31.3 g/dL (ref 30.0–36.0)
MCV: 92.6 fL (ref 78.0–100.0)
Platelets: 305 10*3/uL (ref 150–400)
RBC: 4.04 MIL/uL (ref 3.87–5.11)
RDW: 14.1 % (ref 11.5–15.5)
WBC: 19 10*3/uL — ABNORMAL HIGH (ref 4.0–10.5)

## 2017-08-04 LAB — GLUCOSE, CAPILLARY
GLUCOSE-CAPILLARY: 116 mg/dL — AB (ref 65–99)
GLUCOSE-CAPILLARY: 151 mg/dL — AB (ref 65–99)

## 2017-08-04 LAB — BLOOD GAS, ARTERIAL
Acid-Base Excess: 5 mmol/L — ABNORMAL HIGH (ref 0.0–2.0)
BICARBONATE: 30.9 mmol/L — AB (ref 20.0–28.0)
DRAWN BY: 517021
O2 Content: 2 L/min
O2 Saturation: 98.2 %
PH ART: 7.307 — AB (ref 7.350–7.450)
Patient temperature: 98.6
pCO2 arterial: 63.7 mmHg — ABNORMAL HIGH (ref 32.0–48.0)
pO2, Arterial: 128 mmHg — ABNORMAL HIGH (ref 83.0–108.0)

## 2017-08-04 LAB — I-STAT BETA HCG BLOOD, ED (MC, WL, AP ONLY): I-stat hCG, quantitative: <5 m[IU]/mL (ref <5)

## 2017-08-04 LAB — TROPONIN I
Troponin I: <0.03 ng/mL (ref <0.03)
Troponin I: <0.03 ng/mL (ref <0.03)
Troponin I: <0.03 ng/mL (ref <0.03)

## 2017-08-04 LAB — BRAIN NATRIURETIC PEPTIDE: B Natriuretic Peptide: 12.4 pg/mL (ref 0.0–100.0)

## 2017-08-04 LAB — I-STAT TROPONIN, ED: Troponin i, poc: 0 ng/mL (ref 0.00–0.08)

## 2017-08-04 LAB — I-STAT CG4 LACTIC ACID, ED: Lactic Acid, Venous: 0.7 mmol/L (ref 0.5–1.9)

## 2017-08-04 LAB — D-DIMER, QUANTITATIVE: D-Dimer, Quant: 0.36 ug/mL-FEU (ref 0.00–0.50)

## 2017-08-04 MED ORDER — METHYLPREDNISOLONE SODIUM SUCC 125 MG IJ SOLR
80.0000 mg | Freq: Three times a day (TID) | INTRAMUSCULAR | Status: DC
  Administered 2017-08-04: 21:00:00 via INTRAVENOUS
  Administered 2017-08-04 – 2017-08-05 (×3): 80 mg via INTRAVENOUS
  Filled 2017-08-04 (×4): qty 2

## 2017-08-04 MED ORDER — CALCITRIOL 0.5 MCG PO CAPS
0.5000 ug | ORAL_CAPSULE | Freq: Every day | ORAL | Status: DC
  Administered 2017-08-04 – 2017-08-06 (×3): 0.5 ug via ORAL
  Filled 2017-08-04 (×3): qty 1

## 2017-08-04 MED ORDER — LISINOPRIL 5 MG PO TABS
5.0000 mg | ORAL_TABLET | Freq: Every day | ORAL | Status: DC
  Administered 2017-08-05 – 2017-08-06 (×2): 5 mg via ORAL
  Filled 2017-08-04: qty 2
  Filled 2017-08-04 (×2): qty 1

## 2017-08-04 MED ORDER — DICYCLOMINE HCL 20 MG PO TABS
20.0000 mg | ORAL_TABLET | Freq: Two times a day (BID) | ORAL | Status: DC
  Administered 2017-08-04 – 2017-08-06 (×4): 20 mg via ORAL
  Filled 2017-08-04 (×4): qty 1

## 2017-08-04 MED ORDER — HYDROCODONE-ACETAMINOPHEN 10-325 MG PO TABS
1.0000 | ORAL_TABLET | Freq: Four times a day (QID) | ORAL | Status: AC | PRN
  Administered 2017-08-04 – 2017-08-05 (×4): 1 via ORAL
  Filled 2017-08-04 (×4): qty 1

## 2017-08-04 MED ORDER — PROMETHAZINE HCL 25 MG PO TABS: 25.0000 mg | ORAL_TABLET | Freq: Three times a day (TID) | ORAL | Status: DC | PRN

## 2017-08-04 MED ORDER — SODIUM CHLORIDE 0.9% FLUSH
3.0000 mL | Freq: Two times a day (BID) | INTRAVENOUS | Status: DC
  Administered 2017-08-04 – 2017-08-06 (×5): 3 mL via INTRAVENOUS

## 2017-08-04 MED ORDER — LEVOTHYROXINE SODIUM 112 MCG PO TABS
224.0000 ug | ORAL_TABLET | Freq: Every day | ORAL | Status: DC
  Administered 2017-08-05 – 2017-08-06 (×2): 224 ug via ORAL
  Filled 2017-08-04 (×2): qty 2

## 2017-08-04 MED ORDER — POTASSIUM CHLORIDE CRYS ER 20 MEQ PO TBCR
40.0000 meq | EXTENDED_RELEASE_TABLET | Freq: Every day | ORAL | Status: DC
  Administered 2017-08-04 – 2017-08-06 (×3): 40 meq via ORAL
  Filled 2017-08-04 (×3): qty 2

## 2017-08-04 MED ORDER — FUROSEMIDE 10 MG/ML IJ SOLN
40.0000 mg | Freq: Once | INTRAMUSCULAR | Status: AC
  Administered 2017-08-04: 40 mg via INTRAVENOUS
  Filled 2017-08-04: qty 4

## 2017-08-04 MED ORDER — ENOXAPARIN SODIUM 40 MG/0.4ML ~~LOC~~ SOLN
40.0000 mg | SUBCUTANEOUS | Status: DC
  Administered 2017-08-04 – 2017-08-05 (×2): 40 mg via SUBCUTANEOUS
  Filled 2017-08-04 (×3): qty 0.4

## 2017-08-04 MED ORDER — ALBUTEROL SULFATE (2.5 MG/3ML) 0.083% IN NEBU
2.5000 mg | INHALATION_SOLUTION | Freq: Three times a day (TID) | RESPIRATORY_TRACT | Status: DC
Start: 2017-08-04 — End: 2017-08-05
  Administered 2017-08-04 – 2017-08-05 (×6): 2.5 mg via RESPIRATORY_TRACT
  Filled 2017-08-04 (×6): qty 3

## 2017-08-04 MED ORDER — PANTOPRAZOLE SODIUM 40 MG PO TBEC
40.0000 mg | DELAYED_RELEASE_TABLET | Freq: Every day | ORAL | Status: DC
  Administered 2017-08-04 – 2017-08-06 (×3): 40 mg via ORAL
  Filled 2017-08-04 (×3): qty 1

## 2017-08-04 MED ORDER — ALLOPURINOL 100 MG PO TABS
100.0000 mg | ORAL_TABLET | Freq: Every day | ORAL | Status: DC
  Administered 2017-08-04 – 2017-08-06 (×3): 100 mg via ORAL
  Filled 2017-08-04 (×3): qty 1

## 2017-08-04 MED ORDER — ALBUTEROL SULFATE (2.5 MG/3ML) 0.083% IN NEBU
2.5000 mg | INHALATION_SOLUTION | Freq: Four times a day (QID) | RESPIRATORY_TRACT | Status: DC | PRN
  Administered 2017-08-06: 2.5 mg via RESPIRATORY_TRACT
  Filled 2017-08-04: qty 3

## 2017-08-04 MED ORDER — FERROUS SULFATE 325 (65 FE) MG PO TABS
325.0000 mg | ORAL_TABLET | Freq: Every day | ORAL | Status: DC
  Administered 2017-08-04 – 2017-08-06 (×3): 325 mg via ORAL
  Filled 2017-08-04 (×3): qty 1

## 2017-08-04 MED ORDER — SENNOSIDES-DOCUSATE SODIUM 8.6-50 MG PO TABS
1.0000 | ORAL_TABLET | Freq: Every day | ORAL | Status: DC
  Filled 2017-08-04 (×2): qty 1

## 2017-08-04 MED ORDER — SODIUM CHLORIDE 0.9% FLUSH: 3.0000 mL | INTRAVENOUS | Status: DC | PRN

## 2017-08-04 MED ORDER — FUROSEMIDE 40 MG PO TABS
40.0000 mg | ORAL_TABLET | Freq: Two times a day (BID) | ORAL | Status: DC
  Administered 2017-08-04 – 2017-08-05 (×2): 40 mg via ORAL
  Filled 2017-08-04 (×2): qty 1

## 2017-08-04 MED ORDER — CLONAZEPAM 0.5 MG PO TABS
0.5000 mg | ORAL_TABLET | Freq: Three times a day (TID) | ORAL | Status: DC | PRN
  Administered 2017-08-04 – 2017-08-06 (×7): 0.5 mg via ORAL
  Filled 2017-08-04 (×7): qty 1

## 2017-08-04 MED ORDER — ACETAMINOPHEN 650 MG RE SUPP: 650.0000 mg | Freq: Four times a day (QID) | RECTAL | Status: DC | PRN

## 2017-08-04 MED ORDER — MIRTAZAPINE 15 MG PO TABS
15.0000 mg | ORAL_TABLET | Freq: Every day | ORAL | Status: DC
  Administered 2017-08-04 – 2017-08-05 (×2): 15 mg via ORAL
  Filled 2017-08-04 (×2): qty 1

## 2017-08-04 MED ORDER — CARVEDILOL 6.25 MG PO TABS
6.2500 mg | ORAL_TABLET | Freq: Two times a day (BID) | ORAL | Status: DC
  Administered 2017-08-04 – 2017-08-05 (×2): 6.25 mg via ORAL
  Filled 2017-08-04 (×2): qty 1

## 2017-08-04 MED ORDER — PRAVASTATIN SODIUM 40 MG PO TABS
40.0000 mg | ORAL_TABLET | Freq: Every evening | ORAL | Status: DC
  Administered 2017-08-04 – 2017-08-05 (×2): 40 mg via ORAL
  Filled 2017-08-04 (×2): qty 1

## 2017-08-04 MED ORDER — IPRATROPIUM-ALBUTEROL 0.5-2.5 (3) MG/3ML IN SOLN
3.0000 mL | Freq: Once | RESPIRATORY_TRACT | Status: AC
Start: 2017-08-04 — End: 2017-08-04
  Administered 2017-08-04: 3 mL via RESPIRATORY_TRACT
  Filled 2017-08-04: qty 3

## 2017-08-04 MED ORDER — ACETAMINOPHEN 325 MG PO TABS: 650.0000 mg | ORAL_TABLET | Freq: Four times a day (QID) | ORAL | Status: DC | PRN

## 2017-08-04 MED ORDER — LORATADINE 10 MG PO TABS
10.0000 mg | ORAL_TABLET | Freq: Every day | ORAL | Status: DC | PRN
Start: 2017-08-04 — End: 2017-08-06

## 2017-08-04 MED ORDER — AZITHROMYCIN 500 MG IV SOLR
500.0000 mg | INTRAVENOUS | Status: DC
Start: 2017-08-04 — End: 2017-08-05
  Administered 2017-08-04 – 2017-08-05 (×2): 500 mg via INTRAVENOUS
  Filled 2017-08-04 (×2): qty 500

## 2017-08-04 MED ORDER — SODIUM CHLORIDE 0.9 % IV SOLN: 250.0000 mL | INTRAVENOUS | Status: DC | PRN

## 2017-08-04 MED ORDER — UMECLIDINIUM-VILANTEROL 62.5-25 MCG/INH IN AEPB
1.0000 | INHALATION_SPRAY | Freq: Every day | RESPIRATORY_TRACT | Status: DC
Start: 2017-08-04 — End: 2017-08-06
  Administered 2017-08-04 – 2017-08-06 (×3): 1 via RESPIRATORY_TRACT
  Filled 2017-08-04: qty 14

## 2017-08-04 NOTE — Consult Note (Signed)
Admit date: 08/04/2017 Referring Physician  Dr. Maudie Mercury Primary Physician  [name], MD Primary Cardiologist  Dr. Loralie Champagne Reason for Consultation  CP  HPI: Michele Owens is a 57 y.o. female who is being seen today for the evaluation of chest pain at the request of Dr. Maudie Mercury  This is a 57yo obese AAF with a history of chronic diastolic CHF, COPD on home O2, DM, GERD, HTN, SVT and OSA.  She presented to ER with complaints of chest pain in the mid upper chest associated with cough productive of yellow sputum and SOB.  She initially thought she had a cold but when sx continued she went to the ER for further evaluation.   She says that the CP started after she developed a cough and is worse with deep breathing and cough.  It is also much worse with palpation of her chest wall.  She does not weigh herself at home but has some LE edema.      PMH:   Past Medical History:  Diagnosis Date  . Anxiety   . Arthritis   . Asthma   . Chronic diastolic CHF (congestive heart failure) ([facility])   . COPD (chronic obstructive pulmonary disease) ([facility])    Uses Oxygen at night  . Depression   . Diabetes mellitus without complication ([facility])   . Dyspnea    occ  . Family history of thyroid cancer   . GERD (gastroesophageal reflux disease)   . Gout   . Headache    migraines  . History of DVT of lower extremity   . History of nuclear stress test    Myoview 2/17:  Low risk stress nuclear study with a small, moderate intensity, partially reversible inferior lateral defect consistent with small prior infarct and minimal peri-infarct ischemia; EF 68 with normal wall motion  . Hypertension   . OSA (obstructive sleep apnea)   . Pneumonia   . Thyroid cancer ([facility]) 09/23/2016     PSH:   Past Surgical History:  Procedure Laterality Date  . BUNIONECTOMY Bilateral   . CARDIAC CATHETERIZATION N/A 06/23/2015   Procedure: Right/Left Heart Cath and Coronary Angiography;  Surgeon: [name], MD;   Location: [facility];  Service: Cardiovascular;  Laterality: N/A;  . COLONOSCOPY WITH PROPOFOL N/A 12/15/2015   Procedure: COLONOSCOPY WITH PROPOFOL;  Surgeon: [name], MD;  Location: [facility];  Service: Endoscopy;  Laterality: N/A;  . THYROIDECTOMY  09/19/2016  . THYROIDECTOMY N/A 09/19/2016   Procedure: TOTAL THYROIDECTOMY;  Surgeon: [name], MD;  Location: [facility];  Service: General;  Laterality: N/A;  . TONSILLECTOMY    . TOTAL ABDOMINAL HYSTERECTOMY  07/14/10    Allergies:  Bee venom; Ibuprofen; Lamisil [terbinafine]; and Nsaids Prior to Admit Meds:   (Not in a hospital admission) Fam HX:    Family History  Problem Relation Age of Onset  . Cancer Father        thought to be due to exposure to concrete  . Diabetes Mother   . Thyroid cancer Mother        dx in her 67s-60s  . Cancer Maternal Uncle        2 uncles with cancer NOS  . Brain cancer Paternal Aunt   . Cancer Cousin        maternal first cousin - NOS  . Cancer Cousin        maternal first cousin - NOS   Social HX:    Social  History   Socioeconomic History  . Marital status: Single    Spouse name: Not on file  . Number of children: Not on file  . Years of education: Not on file  . Highest education level: Not on file  Occupational History  . Occupation: unemployeed  Social Needs  . Financial resource strain: Not on file  . Food insecurity:    Worry: Not on file    Inability: Not on file  . Transportation needs:    Medical: Not on file    Non-medical: Not on file  Tobacco Use  . Smoking status: Former Smoker    Packs/day: 0.50    Years: 41.00    Pack years: 20.50    Types: Cigarettes    Last attempt to quit: 07/2016    Years since quitting: 1.0  . Smokeless tobacco: Never Used  . Tobacco comment: Starting to Wean Off -- using Nicotine Patch  Substance and Sexual Activity  . Alcohol use: Yes    Alcohol/week: 0.0 oz    Comment: ? none now  . Drug use: Yes    Types: Marijuana     Comment: none for long time  . Sexual activity: Yes  Lifestyle  . Physical activity:    Days per week: Not on file    Minutes per session: Not on file  . Stress: Not on file  Relationships  . Social connections:    Talks on phone: Not on file    Gets together: Not on file    Attends religious service: Not on file    Active member of club or organization: Not on file    Attends meetings of clubs or organizations: Not on file    Relationship status: Not on file  . Intimate partner violence:    Fear of current or ex partner: Not on file    Emotionally abused: Not on file    Physically abused: Not on file    Forced sexual activity: Not on file  Other Topics Concern  . Not on file  Social History Narrative  . Not on file     ROS:  All  ROS were addressed and are negative except what is stated in the HPI  Physical Exam: Blood pressure 103/90, pulse 92, temperature 98.3 F (36.8 C), temperature source Oral, resp. rate 14, height 5\' 5"  (1.651 m), weight 295 lb (133.8 kg), SpO2 94 %.    General: Well developed, well nourished, in no acute distress Head: Eyes PERRLA, No xanthomas.   Normal cephalic and atramatic  Lungs:   Scattered rhonchi Heart:   HRRR S1 S2 Pulses are 2+ & equal.            No carotid bruit. No JVD.  No abdominal bruits. No femoral bruits. Abdomen: Bowel sounds are positive, abdomen soft and non-tender without masses or                  Hernia's noted. Msk:  Back normal, normal gait. Normal strength and tone for age. Extremities:   No clubbing, cyanosis.  Trace LE edema.  DP +1 Neuro: Alert and oriented X 3. Psych:  Good affect, responds appropriately    Labs:   Lab Results  Component Value Date   WBC 19.0 (H) 08/04/2017   HGB 11.7 (L) 08/04/2017   HCT 37.4 08/04/2017   MCV 92.6 08/04/2017   PLT 305 08/04/2017   Recent Labs  Lab 08/04/17 0425  NA 138  K 3.6  CL 100*  CO2 30  BUN 17  CREATININE 0.65  CALCIUM 8.8*  GLUCOSE 112*   No results  found for: PTT Lab Results  Component Value Date   INR 1.10 01/23/2016   INR 0.98 06/23/2015   Lab Results  Component Value Date   CKTOTAL 65 09/14/2010   CKMB 2.1 09/14/2010   TROPONINI <0.03 08/04/2017     Lab Results  Component Value Date   CHOL 167 09/23/2016   Lab Results  Component Value Date   HDL 39 (L) 09/23/2016   Lab Results  Component Value Date   LDLCALC 93 09/23/2016   Lab Results  Component Value Date   TRIG 176 (H) 09/23/2016   TRIG 388 (H) 07/22/2016   TRIG 234 (H) 07/19/2016   Lab Results  Component Value Date   CHOLHDL 4.3 09/23/2016   No results found for: LDLDIRECT    Radiology:  Dg Chest 2 View  Result Date: 08/04/2017 CLINICAL DATA:  Initial evaluation for acute chest pain. EXAM: CHEST - 2 VIEW COMPARISON:  Prior radiograph from 04/17/2017. FINDINGS: Cardiomegaly, stable.  Mediastinal silhouette within normal limits. Lungs mildly hypoinflated. Mild diffuse vascular and interstitial prominence, suggesting mild pulmonary interstitial congestion. No frank pulmonary edema. Superimposed left basilar atelectasis. No focal infiltrates. No pneumothorax. No acute osseus abnormality. Surgical clips overlie the lower mid neck. IMPRESSION: 1. Cardiomegaly with mild diffuse pulmonary interstitial congestion. 2. Superimposed left basilar atelectasis. No consolidative airspace disease. Electronically Signed   By: Jeannine Boga M.D.   On: 08/04/2017 06:20     Telemetry    NSR - Personally Reviewed  ECG    NSR with nonspecific ST abnormality- Personally Reviewed   ASSESSMENT/PLAN:   1.  Chest pain  - trop negative x 2 - likely related to COPD exacerbation - normal coronary arteries by cath 06/2015 - EKG with no acute ST changes - repeat 2D echo - if LVF is normal then no further ischemic workup indicated at this time - her pain is very reproducible with palpation of her chest wall and started after she started having a cough and is worse with  breathing and cough c/w MSK etiology  2.  Acute on chronic diastolic CHF - BNP is normal (although may be falsely low with obesity) but cxray shows mild diffuse pulmonary interstitial congestion - echo a year ago with normal LVF with EF 55-60% - will repeat echo to make sure her LVF remains preserved - weight was 273lbs at OV with AHF clinic a year ago - given 40mg  IV Lasix this am and then placed back on PO home dose  3.  Moderate pulmonary HTN  - likely combination of WHO group 2 and 3 from COPD and pulmonary venous HTN from CHF - RHC 2017 with mild pulmonary HTN - continue diuretics - repeat 2D echo to make sure this has not progressed  4.  Acute on chronic COPD exacerbation - continue IV steroids, antibx and inhalers per TRH - she is on home O2 - last PFTs with severe obstruction  5.  HTN - BP well controlled today - continue carvedilol and ACE I - consider changing carvedilol to cardioselective BB given COPD   Fransico Him, MD  08/04/2017  1:03 PM

## 2017-08-04 NOTE — ED Provider Notes (Signed)
[facility] EMERGENCY DEPARTMENT Provider Note   CSN: [id number] Arrival date & time: 08/04/17  0414     History   Chief Complaint Chief Complaint  Patient presents with  . Chest Pain    HPI Michele Owens is a 57 y.o. female.  The history is provided by the patient.  She has history of diabetes, hypertension, COPD, diastolic heart failure, thyroid cancer, gout and comes in complaining of cough, chest pain, dyspnea.  She is a very evasive historian, but apparently has had a cough for about the last 3 weeks which is productive of yellowish to brownish sputum.  She has noted some increase in her baseline dyspnea with significant orthopnea.  She denies fever, but did have sweats yesterday.  She has had developed chest pain over the last 2 days.  Chest pain is a soreness in the anterior chest which is worse with movement and worse with deep breathing and worse with palpation.  She denies nausea or vomiting.  Also, 1 week ago she did have foot surgery to have several toenails removed.  Past Medical History:  Diagnosis Date  . Anxiety   . Arthritis   . Asthma   . Chronic diastolic CHF (congestive heart failure) ([place])   . COPD (chronic obstructive pulmonary disease) (HCC)    Uses Oxygen at night  . Depression   . Diabetes mellitus without complication ([place])   . Dyspnea    occ  . Family history of thyroid cancer   . GERD (gastroesophageal reflux disease)   . Gout   . Headache    migraines  . History of DVT of lower extremity   . History of nuclear stress test    Myoview 2/17:  Low risk stress nuclear study with a small, moderate intensity, partially reversible inferior lateral defect consistent with small prior infarct and minimal peri-infarct ischemia; EF 68 with normal wall motion  . Hypertension   . Pneumonia   . Thyroid cancer ([place]) 09/23/2016    Patient Active Problem List   Diagnosis Date Noted  . Genetic testing 02/27/2017  . Family history of thyroid  cancer   . Hypomagnesemia 10/03/2016  . Boil, breast 10/03/2016  . Atypical chest pain   . HLD (hyperlipidemia) 09/23/2016  . COPD (chronic obstructive pulmonary disease) ([place]) 09/23/2016  . Gout 09/23/2016  . DVT (deep vein thrombosis) in pregnancy ([place]) 09/23/2016  . Thyroid cancer ([place]) 09/23/2016  . Hypocalcemia 09/23/2016  . AKI (acute kidney injury) ([place]) 09/23/2016  . 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 Acute pulmonary edema (HCC)   . Acute hypoxemic respiratory failure ([place])   . Acute on chronic respiratory failure with hypoxemia ([place])   . Ventilator dependent ([place])   . Respiratory failure ([place]) 07/16/2016  . Chronic combined systolic (congestive) and diastolic (congestive) heart failure ([place]) 02/10/2016  . Dyspnea   . CAP (community acquired pneumonia) 01/23/2016  . 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 Pulmonary hypertension ([place]) 01/23/2016  . Sepsis ([place]) 01/23/2016  . Obesity hypoventilation syndrome ([place]) 08/26/2015  . Chest pain 06/16/2015  . Dyslipidemia associated with type 2 diabetes mellitus ([place]) 04/24/2015  . Leukocytosis 04/24/2015  . Controlled type 2 diabetes mellitus without complication, without long-term current use of insulin ([place]) 04/24/2015  . Anxiety 04/24/2015  . Benign essential HTN 04/24/2015  . Normocytic anemia 04/24/2015  . Asthma with COPD with exacerbation ([place]) 04/24/2015  . OSA (obstructive sleep apnea) 05/10/2012  . Tobacco abuse  07/04/2011    Past Surgical History:  Procedure Laterality Date  . BUNIONECTOMY Bilateral   . CARDIAC CATHETERIZATION N/A 06/23/2015   Procedure: Right/Left Heart Cath and Coronary Angiography;  Surgeon: [name], MD;  Location: [facility];  Service: Cardiovascular;  Laterality: N/A;  . COLONOSCOPY WITH PROPOFOL N/A 12/15/2015   Procedure: COLONOSCOPY WITH PROPOFOL;  Surgeon: [name], MD;  Location: [facility];  Service: Endoscopy;   Laterality: N/A;  . THYROIDECTOMY  09/19/2016  . THYROIDECTOMY N/A 09/19/2016   Procedure: TOTAL THYROIDECTOMY;  Surgeon: [name], MD;  Location: [facility];  Service: General;  Laterality: N/A;  . TONSILLECTOMY    . TOTAL ABDOMINAL HYSTERECTOMY  07/14/10     OB History   None      Home Medications    Prior to Admission medications   Medication Sig Start Date End Date Taking? Authorizing Provider  acetaminophen (TYLENOL) 325 MG tablet Take 1 tablet (325 mg total) by mouth every 6 (six) hours as needed for mild pain or headache. Caution: Acetaminophen from all sources not to exceed greater than 3 g per day. Patient not taking: Reported on 04/17/2017 10/04/16   Velvet Bathe, MD  albuterol (ACCUNEB) 1.25 MG/3ML nebulizer solution Take 1 ampule by nebulization 2 (two) times daily.    [provider]  albuterol (PROVENTIL HFA;VENTOLIN HFA) 108 (90 BASE) MCG/ACT inhaler Inhale 1-2 puffs into the lungs every 6 (six) hours as needed for wheezing. 04/09/12   Billy Fischer, MD  allopurinol (ZYLOPRIM) 100 MG tablet Take 100 mg by mouth daily.    [provider]  aspirin-acetaminophen-caffeine (EXCEDRIN MIGRAINE) [id number] MG tablet Take 2 tablets by mouth daily as needed for headache.    [provider]  benzonatate (TESSALON) 200 MG capsule Take 1 capsule (200 mg total) by mouth 3 (three) times daily as needed for cough. Patient not taking: Reported on 06/06/2017 07/24/16   Lavina Hamman, MD  budesonide-formoterol [facility]) 80-4.5 MCG/ACT inhaler Inhale 2 puffs into the lungs 2 (two) times daily. 07/24/16   Lavina Hamman, MD  calcitRIOL (ROCALTROL) 0.5 MCG capsule Take 1 capsule (0.5 mcg total) by mouth daily. 09/26/16   Hongalgi, [name], MD  calcium carbonate (TUMS) 500 MG chewable tablet Chew 4 tablets (800 mg of elemental calcium total) by mouth 4 (four) times daily. 09/25/16   Hongalgi, [name], MD  carvedilol (COREG) 6.25 MG tablet TAKE ONE TABLET BY MOUTH TWICE  DAILY WITH A MEAL 03/23/17   [name], MD  clindamycin (CLEOCIN) 150 MG capsule TAKE ONE CAPSULE BY MOUTH THREE TIMES DAILY 08/03/17   Hyatt, Max T, DPM  clonazePAM (KLONOPIN) 1 MG tablet Take 1 mg by mouth 3 (three) times daily as needed. for anxiety 05/08/17   [provider]  clonazePAM (KLONOPIN) 2 MG tablet Take 1 tablet (2 mg total) by mouth 2 (two) times daily. Patient not taking: Reported on 06/06/2017 01/27/16   Cristal Ford, DO  diclofenac sodium (VOLTAREN) 1 % GEL Apply 2 g topically 4 (four) times daily as needed (for pain).     [provider]  dicyclomine (BENTYL) 20 MG tablet Take 1 tablet (20 mg total) by mouth 2 (two) times daily. 06/06/17   Hayden Rasmussen, MD  doxycycline (VIBRAMYCIN) 100 MG capsule Take 1 capsule (100 mg total) by mouth 2 (two) times daily. 06/29/17   Hyatt, Max T, DPM  EPINEPHrine 0.3 mg/0.3 mL IJ SOAJ injection Inject 0.3 mg into the muscle daily  as needed (allergic reaction).    [provider]  FEROSUL 325 (65 Fe) MG tablet Take 325 mg by mouth daily. 03/22/17   [provider]  furosemide (LASIX) 40 MG tablet Take 1 tablet (40 mg total) by mouth daily. Patient taking differently: Take 40 mg by mouth 2 (two) times daily.  09/25/16   Hongalgi, [name], MD  HYDROcodone-acetaminophen (NORCO/VICODIN) 5-325 MG tablet Take 1-2 tablets by mouth every 4 (four) hours as needed for moderate pain. 09/20/16   [name], MD  ipratropium-albuterol (DUONEB) 0.5-2.5 (3) MG/3ML SOLN Use one vial in Nebulizer four times daily as directed. 04/05/17   [provider]  levofloxacin (LEVAQUIN) 250 MG tablet Take 1 tablet (250 mg total) by mouth daily. Patient not taking: Reported on 06/06/2017 04/17/17   Pisciotta, Elmyra Ricks, PA-C  levothyroxine (SYNTHROID, LEVOTHROID) 200 MCG tablet Take 200 mcg by mouth daily before breakfast.    [provider]  lisinopril (PRINIVIL,ZESTRIL) 5 MG tablet Take 1 tablet (5 mg total) by  mouth daily. Needs office visit 04/25/17   [name], MD  loratadine (CLARITIN) 10 MG tablet Take 10 mg by mouth daily as needed for allergies.     [provider]  metFORMIN (GLUCOPHAGE) 500 MG tablet Take 1 tablet (500 mg total) by mouth 2 (two) times daily with a meal. 12/11/14   [name], MD  mirtazapine (REMERON) 15 MG tablet Take 15 mg by mouth at bedtime. 11/16/14   [provider]  nicotine (NICODERM CQ - DOSED IN MG/24 HOURS) 14 mg/24hr patch Place 1 patch (14 mg total) onto the skin daily. Patient not taking: Reported on 06/06/2017 07/25/16   Lavina Hamman, MD  nystatin cream (MYCOSTATIN) APPLY TO THE AFFECTED AREA(S) TWICE DAILY AS NEEDED 04/13/17   [provider]  omeprazole (PRILOSEC) 40 MG capsule Take 40 mg by mouth daily. 04/13/15   [provider]  potassium chloride SA (K-DUR,KLOR-CON) 20 MEQ tablet Take 2 tablets (40 mEq total) by mouth daily. 08/01/16   [name], MD  pravastatin (PRAVACHOL) 40 MG tablet Take 40 mg by mouth every evening.  03/29/15   [provider]  predniSONE (DELTASONE) 50 MG tablet Take 1 tablet daily with breakfast Patient not taking: Reported on 06/06/2017 04/17/17   Pisciotta, Elmyra Ricks, PA-C  promethazine (PHENERGAN) 25 MG tablet Take 1 tablet (25 mg total) by mouth every 8 (eight) hours as needed. 07/24/17   Hyatt, Max T, DPM  QC STOOL SOFTENER PLS LAXATIVE 8.6-50 MG tablet Take 1 tablet by mouth at bedtime. 04/11/17   [provider]  SYNTHROID 100 MCG tablet Take 1 tablet (100 mcg total) by mouth daily. 09/20/16   [name], MD  tiotropium (SPIRIVA) 18 MCG inhalation capsule Place 18 mcg into inhaler and inhale daily.    [provider]  TUSSIN DM 100-10 MG/5ML liquid take 2 teaspoonfuls by mouth every 4-6 hours as needed for cough; Do not exceed 6 doses in a 24-hour period 02/19/17   [provider]    Family History Family History  Problem Relation Age of Onset  .  Cancer Father        thought to be due to exposure to concrete  . Diabetes Mother   . Thyroid cancer Mother        dx in her 38s-60s  . Cancer Maternal Uncle        2 uncles with cancer NOS  . Brain cancer Paternal Aunt   .  Cancer Cousin        maternal first cousin - NOS  . Cancer Cousin        maternal first cousin - NOS    Social History Social History   Tobacco Use  . Smoking status: Former Smoker    Packs/day: 0.50    Years: 41.00    Pack years: 20.50    Types: Cigarettes    Last attempt to quit: 07/2016    Years since quitting: 1.0  . Smokeless tobacco: Never Used  . Tobacco comment: Starting to Wean Off -- using Nicotine Patch  Substance Use Topics  . Alcohol use: Yes    Alcohol/week: 0.0 oz    Comment: ? none now  . Drug use: Yes    Types: Marijuana    Comment: none for long time     Allergies   Bee venom; Ibuprofen; Lamisil [terbinafine]; and Nsaids   Review of Systems Review of Systems  All other systems reviewed and are negative.    Physical Exam Updated Vital Signs BP (!) 112/98 (BP Location: Right Arm)   Pulse 97   Temp 98.3 F (36.8 C) (Oral)   Resp (!) 22   Ht 5\' 5"  (1.651 m)   Wt 133.8 kg (295 lb)   SpO2 98%   BMI 49.09 kg/m   Physical Exam  Nursing note and vitals reviewed.  57 year old female, who appears somewhat dyspneic and is unable to complete sentences without stopping for another breath, but in no acute distress. Vital signs are significant for mildly elevated respiratory rate and for elevated diastolic blood pressure. Oxygen saturation is 98%, which is normal. Head is normocephalic and atraumatic. PERRLA, EOMI. Oropharynx is clear. Neck is nontender and supple without adenopathy or JVD. Back is nontender and there is no CVA tenderness. Lungs are clear without rales, wheezes, or rhonchi. Chest is mildly tender diffusely. Heart has regular rate and rhythm without murmur. Abdomen is soft, flat, nontender without masses or  hepatosplenomegaly and peristalsis is normoactive. Extremities have trace edema, full range of motion is present.  Feet are bandaged, and dressings are not removed. Skin is warm and dry without rash. Neurologic: Mental status is normal, cranial nerves are intact, there are no motor or sensory deficits.  ED Treatments / Results  Labs (all labs ordered are listed, but only abnormal results are displayed) Labs Reviewed  BASIC METABOLIC PANEL - Abnormal; Notable for the following components:      Result Value   Chloride 100 (*)    Glucose, Bld 112 (*)    Calcium 8.8 (*)    All other components within normal limits  CBC - Abnormal; Notable for the following components:   WBC 19.0 (*)    Hemoglobin 11.7 (*)    All other components within normal limits  DIFFERENTIAL - Abnormal; Notable for the following components:   Neutro Abs 13.7 (*)    All other components within normal limits  BLOOD GAS, ARTERIAL - Abnormal; Notable for the following components:   pH, Arterial 7.307 (*)    pCO2 arterial 63.7 (*)    pO2, Arterial 128 (*)    Bicarbonate 30.9 (*)    Acid-Base Excess 5.0 (*)    All other components within normal limits  BRAIN NATRIURETIC PEPTIDE  D-DIMER, QUANTITATIVE (NOT AT [facility])  I-STAT TROPONIN, ED  I-STAT BETA HCG BLOOD, ED ([facility], [facility], [facility] ONLY)  I-STAT CG4 LACTIC ACID, ED    EKG EKG Interpretation  Date/Time:  Saturday August 04 2017 04:19:42 EDT Ventricular Rate:  85 PR Interval:    QRS Duration: 85 QT Interval:  391 QTC Calculation: 465 R Axis:   41 Text Interpretation:  Sinus rhythm Low voltage, precordial leads When compared with ECG of 06/06/2017, Premature ventricular complexes are no longer present Confirmed by [name] ([id number]) on 08/04/2017 4:23:39 AM   Radiology Dg Chest 2 View  Result Date: 08/04/2017 CLINICAL DATA:  Initial evaluation for acute chest pain. EXAM: CHEST - 2 VIEW COMPARISON:  Prior radiograph from 04/17/2017. FINDINGS: Cardiomegaly, stable.   Mediastinal silhouette within normal limits. Lungs mildly hypoinflated. Mild diffuse vascular and interstitial prominence, suggesting mild pulmonary interstitial congestion. No frank pulmonary edema. Superimposed left basilar atelectasis. No focal infiltrates. No pneumothorax. No acute osseus abnormality. Surgical clips overlie the lower mid neck. IMPRESSION: 1. Cardiomegaly with mild diffuse pulmonary interstitial congestion. 2. Superimposed left basilar atelectasis. No consolidative airspace disease. Electronically Signed   By: Jeannine Boga M.D.   On: 08/04/2017 06:20    Procedures Procedures  CRITICAL CARE Performed by: [name] Total critical care time: 35 minutes Critical care time was exclusive of separately billable procedures and treating other patients. Critical care was necessary to treat or prevent imminent or life-threatening deterioration. Critical care was time spent personally by me on the following activities: development of treatment plan with patient and/or surrogate as well as nursing, discussions with consultants, evaluation of patient's response to treatment, examination of patient, obtaining history from patient or surrogate, ordering and performing treatments and interventions, ordering and review of laboratory studies, ordering and review of radiographic studies, pulse oximetry and re-evaluation of patient's condition.  Medications Ordered in ED Medications  ipratropium-albuterol (DUONEB) 0.5-2.5 (3) MG/3ML nebulizer solution 3 mL (has no administration in time range)  furosemide (LASIX) injection 40 mg (has no administration in time range)     Initial Impression / Assessment and Plan / ED Course  I have reviewed the triage vital signs and the nursing notes.  Pertinent labs & imaging results that were available during my care of the patient were reviewed by me and considered in my medical decision making (see chart for details).  Cough with dyspnea and chest  wall pain.  Old records are reviewed, and she has had hospitalizations for respiratory failure and pneumonia.  Cardiac catheterization in March 2017 showed no significant coronary artery disease, but evidence of diastolic heart failure.  She will be sent for chest x-ray to rule out pneumonia.  She does appear somewhat somnolent at times, will so will check arterial blood gas.  Will check BNP.  With recent surgery, we will also check d-dimer.  D-dimer and BNP are normal.  ABG does show evidence of CO2 retention with respiratory acidosis.  She is this appears to be multifactorial with both COPD and CHF.  She is given albuterol with ipratropium via nebulizer and given intravenous furosemide.  Case is discussed with Dr. Maudie Mercury of [facility] who agrees to admit the patient.  Final Clinical Impressions(s) / ED Diagnoses   Final diagnoses:  Acute and chronic respiratory failure with hypercapnia ([place])  Chronic obstructive pulmonary disease, unspecified COPD type ([place])  Acute on chronic diastolic heart failure (HCC)  Normochromic normocytic anemia    ED Discharge Orders    None       [name], MD 63/84/53 [id number]

## 2017-08-04 NOTE — H&P (Signed)
TRH H&P   Patient Demographics:    Michele Owens, is a 57 y.o. female  MRN: [id number]   DOB - [birth year]  Admit Date - 08/04/2017  Outpatient Primary MD for the patient is [name], MD  Referring MD/NP/PA:   [name]  Outpatient Specialists:    Patient coming from:  home  Chief Complaint  Patient presents with  . Chest Pain   dyspnea   HPI:    Michele Owens  is a 57 y.o. female, w hypertension, dm2, h/o DVT, Jerrye Bushy, CHF (diastolic), Copd on home o2, presents with c/o chest discomfort in the mid upper chest since Thursday associated with cough w yellow sputum, and dyspnea. Pt thought she had a cold and it was not getting better and therefore presented to ED for evaluation.    In Ed,  CXR  IMPRESSION: 1. Cardiomegaly with mild diffuse pulmonary interstitial congestion. 2. Superimposed left basilar atelectasis. No consolidative airspace disease  BNP 12.4 Trop 0.00 D dimer 0.36 Na 138, K 3.6, Bun 17, Creatinine 0.65 Wbc 19.0, Hgb 11.7, Plt 305  PH 7.307, pCo2 63.7 po2 128  Pt will be admitted for chest pain, and dyspnea.      Review of systems:    In addition to the HPI above,   No Fever-chills, No Headache, No changes with Vision or hearing, No problems swallowing food or Liquids,  No Abdominal pain, No Nausea or Vommitting, Bowel movements are regular, No Blood in stool or Urine, No dysuria, No new skin rashes or bruises, No new joints pains-aches,  No new weakness, tingling, numbness in any extremity, No recent weight gain or loss, No polyuria, polydypsia or polyphagia, No significant Mental Stressors.  A full 10 point Review of Systems was done, except as stated above, all other Review of Systems were negative.   With Past History of the following :    Past Medical History:  Diagnosis Date  . Anxiety   . Arthritis   . Asthma     . Chronic diastolic CHF (congestive heart failure) ([place])   . COPD (chronic obstructive pulmonary disease) (HCC)    Uses Oxygen at night  . Depression   . Diabetes mellitus without complication ([place])   . Dyspnea    occ  . Family history of thyroid cancer   . GERD (gastroesophageal reflux disease)   . Gout   . Headache    migraines  . History of DVT of lower extremity   . History of nuclear stress test    Myoview 2/17:  Low risk stress nuclear study with a small, moderate intensity, partially reversible inferior lateral defect consistent with small prior infarct and minimal peri-infarct ischemia; EF 68 with normal wall motion  . Hypertension   . Pneumonia   . Thyroid cancer ([place]) 09/23/2016      Past Surgical History:  Procedure Laterality Date  .  BUNIONECTOMY Bilateral   . CARDIAC CATHETERIZATION N/A 06/23/2015   Procedure: Right/Left Heart Cath and Coronary Angiography;  Surgeon: [name], MD;  Location: [facility];  Service: Cardiovascular;  Laterality: N/A;  . COLONOSCOPY WITH PROPOFOL N/A 12/15/2015   Procedure: COLONOSCOPY WITH PROPOFOL;  Surgeon: [name], MD;  Location: [facility];  Service: Endoscopy;  Laterality: N/A;  . THYROIDECTOMY  09/19/2016  . THYROIDECTOMY N/A 09/19/2016   Procedure: TOTAL THYROIDECTOMY;  Surgeon: [name], MD;  Location: [facility];  Service: General;  Laterality: N/A;  . TONSILLECTOMY    . TOTAL ABDOMINAL HYSTERECTOMY  07/14/10      Social History:     Social History   Tobacco Use  . Smoking status: Former Smoker    Packs/day: 0.50    Years: 41.00    Pack years: 20.50    Types: Cigarettes    Last attempt to quit: 07/2016    Years since quitting: 1.0  . Smokeless tobacco: Never Used  . Tobacco comment: Starting to Wean Off -- using Nicotine Patch  Substance Use Topics  . Alcohol use: Yes    Alcohol/week: 0.0 oz    Comment: ? none now     Lives - at home  Mobility - walks by self   Family History :     Family  History  Problem Relation Age of Onset  . Cancer Father        thought to be due to exposure to concrete  . Diabetes Mother   . Thyroid cancer Mother        dx in her 59s-60s  . Cancer Maternal Uncle        2 uncles with cancer NOS  . Brain cancer Paternal Aunt   . Cancer Cousin        maternal first cousin - NOS  . Cancer Cousin        maternal first cousin - NOS       Home Medications:   Prior to Admission medications   Medication Sig Start Date End Date Taking? Authorizing Provider  acetaminophen (TYLENOL) 325 MG tablet Take 1 tablet (325 mg total) by mouth every 6 (six) hours as needed for mild pain or headache. Caution: Acetaminophen from all sources not to exceed greater than 3 g per day. Patient not taking: Reported on 04/17/2017 10/04/16   Velvet Bathe, MD  albuterol (ACCUNEB) 1.25 MG/3ML nebulizer solution Take 1 ampule by nebulization 2 (two) times daily.    [provider]  albuterol (PROVENTIL HFA;VENTOLIN HFA) 108 (90 BASE) MCG/ACT inhaler Inhale 1-2 puffs into the lungs every 6 (six) hours as needed for wheezing. 04/09/12   Billy Fischer, MD  allopurinol (ZYLOPRIM) 100 MG tablet Take 100 mg by mouth daily.    [provider]  aspirin-acetaminophen-caffeine (EXCEDRIN MIGRAINE) [phone] MG tablet Take 2 tablets by mouth daily as needed for headache.    [provider]  benzonatate (TESSALON) 200 MG capsule Take 1 capsule (200 mg total) by mouth 3 (three) times daily as needed for cough. Patient not taking: Reported on 06/06/2017 07/24/16   Lavina Hamman, MD  budesonide-formoterol [facility]) 80-4.5 MCG/ACT inhaler Inhale 2 puffs into the lungs 2 (two) times daily. 07/24/16   Lavina Hamman, MD  calcitRIOL (ROCALTROL) 0.5 MCG capsule Take 1 capsule (0.5 mcg total) by mouth daily. 09/26/16   Hongalgi, [name], MD  calcium carbonate (TUMS) 500 MG chewable tablet Chew 4 tablets (800 mg of elemental calcium total)  by mouth 4 (four) times daily. 09/25/16    Hongalgi, [name], MD  carvedilol (COREG) 6.25 MG tablet TAKE ONE TABLET BY MOUTH TWICE DAILY WITH A MEAL 03/23/17   [name], MD  clindamycin (CLEOCIN) 150 MG capsule TAKE ONE CAPSULE BY MOUTH THREE TIMES DAILY 08/03/17   Hyatt, Max T, DPM  clonazePAM (KLONOPIN) 1 MG tablet Take 1 mg by mouth 3 (three) times daily as needed. for anxiety 05/08/17   [provider]  clonazePAM (KLONOPIN) 2 MG tablet Take 1 tablet (2 mg total) by mouth 2 (two) times daily. Patient not taking: Reported on 06/06/2017 01/27/16   Cristal Ford, DO  diclofenac sodium (VOLTAREN) 1 % GEL Apply 2 g topically 4 (four) times daily as needed (for pain).     [provider]  dicyclomine (BENTYL) 20 MG tablet Take 1 tablet (20 mg total) by mouth 2 (two) times daily. 06/06/17   Hayden Rasmussen, MD  doxycycline (VIBRAMYCIN) 100 MG capsule Take 1 capsule (100 mg total) by mouth 2 (two) times daily. 06/29/17   Hyatt, Max T, DPM  EPINEPHrine 0.3 mg/0.3 mL IJ SOAJ injection Inject 0.3 mg into the muscle daily as needed (allergic reaction).    [provider]  FEROSUL 325 (65 Fe) MG tablet Take 325 mg by mouth daily. 03/22/17   [provider]  furosemide (LASIX) 40 MG tablet Take 1 tablet (40 mg total) by mouth daily. Patient taking differently: Take 40 mg by mouth 2 (two) times daily.  09/25/16   Hongalgi, [name], MD  HYDROcodone-acetaminophen (NORCO/VICODIN) 5-325 MG tablet Take 1-2 tablets by mouth every 4 (four) hours as needed for moderate pain. 09/20/16   [name], MD  ipratropium-albuterol (DUONEB) 0.5-2.5 (3) MG/3ML SOLN Use one vial in Nebulizer four times daily as directed. 04/05/17   [provider]  levofloxacin (LEVAQUIN) 250 MG tablet Take 1 tablet (250 mg total) by mouth daily. Patient not taking: Reported on 06/06/2017 04/17/17   Pisciotta, Elmyra Ricks, PA-C  levothyroxine (SYNTHROID, LEVOTHROID) 200 MCG tablet Take 200 mcg by mouth daily before breakfast.    [provider]  lisinopril (PRINIVIL,ZESTRIL) 5 MG tablet Take 1 tablet (5 mg total) by mouth daily. Needs office visit 04/25/17   [name], MD  loratadine (CLARITIN) 10 MG tablet Take 10 mg by mouth daily as needed for allergies.     [provider]  metFORMIN (GLUCOPHAGE) 500 MG tablet Take 1 tablet (500 mg total) by mouth 2 (two) times daily with a meal. 12/11/14   [name], MD  mirtazapine (REMERON) 15 MG tablet Take 15 mg by mouth at bedtime. 11/16/14   [provider]  nicotine (NICODERM CQ - DOSED IN MG/24 HOURS) 14 mg/24hr patch Place 1 patch (14 mg total) onto the skin daily. Patient not taking: Reported on 06/06/2017 07/25/16   Lavina Hamman, MD  nystatin cream (MYCOSTATIN) APPLY TO THE AFFECTED AREA(S) TWICE DAILY AS NEEDED 04/13/17   [provider]  omeprazole (PRILOSEC) 40 MG capsule Take 40 mg by mouth daily. 04/13/15   [provider]  potassium chloride SA (K-DUR,KLOR-CON) 20 MEQ tablet Take 2 tablets (40 mEq total) by mouth daily. 08/01/16   [name], MD  pravastatin (PRAVACHOL) 40 MG tablet Take 40 mg by mouth every evening.  03/29/15   [provider]  predniSONE (DELTASONE) 50 MG tablet Take 1 tablet daily with breakfast Patient not taking: Reported on 06/06/2017 04/17/17   Pisciotta, Elmyra Ricks,  PA-C  promethazine (PHENERGAN) 25 MG tablet Take 1 tablet (25 mg total) by mouth every 8 (eight) hours as needed. 07/24/17   Hyatt, Max T, DPM  QC STOOL SOFTENER PLS LAXATIVE 8.6-50 MG tablet Take 1 tablet by mouth at bedtime. 04/11/17   [provider]  SYNTHROID 100 MCG tablet Take 1 tablet (100 mcg total) by mouth daily. 09/20/16   [name], MD  tiotropium (SPIRIVA) 18 MCG inhalation capsule Place 18 mcg into inhaler and inhale daily.    [provider]  TUSSIN DM 100-10 MG/5ML liquid take 2 teaspoonfuls by mouth every 4-6 hours as needed for cough; Do not exceed 6 doses in a 24-hour period 02/19/17   [provider]     Allergies:     Allergies  Allergen Reactions  . Bee Venom Swelling and Other (See Comments)    "All over my body" (swelling)  . Ibuprofen Rash and Other (See Comments)    Severe rash  . Lamisil [Terbinafine] Rash and Other (See Comments)    Pt states this causes her to "feel funny"  . Nsaids Other (See Comments)    Per MD's orders      Physical Exam:   Vitals  Blood pressure 100/89, pulse 92, temperature 98.3 F (36.8 C), temperature source Oral, resp. rate 16, height 5\' 5"  (1.651 m), weight 133.8 kg (295 lb), SpO2 96 %.   1. General  lying in bed in NAD,   2. Normal affect and insight, Not Suicidal or Homicidal, Awake Alert, Oriented X 3.  3. No F.N deficits, ALL C.Nerves Intact, Strength 5/5 all 4 extremities, Sensation intact all 4 extremities, Plantars down going.  4. Ears and Eyes appear Normal, Conjunctivae clear, PERRLA. Moist Oral Mucosa.  5. Supple Neck, No JVD, No cervical lymphadenopathy appriciated, No Carotid Bruits.  6. Symmetrical Chest wall movement, Good air movement bilaterally, + bilateral exp wheezing, no crackles  7. RRR, No Gallops, Rubs or Murmurs, No Parasternal Heave.  8. Positive Bowel Sounds, Abdomen Soft, No tenderness, No organomegaly appriciated,No rebound -guarding or rigidity.  9.  No Cyanosis, Normal Skin Turgor, No Skin Rash or Bruise.  10. Good muscle tone,  joints appear normal , no effusions, Normal ROM.  11. No Palpable Lymph Nodes in Neck or Axillae  Morbidly obese   Data Review:    CBC Recent Labs  Lab 08/04/17 0420 08/04/17 0425  WBC  --  19.0*  HGB  --  11.7*  HCT  --  37.4  PLT  --  305  MCV  --  92.6  MCH  --  29.0  MCHC  --  31.3  RDW  --  14.1  LYMPHSABS 2.8  --   MONOABS 1.0  --   EOSABS 0.3  --   BASOSABS 0.0  --    ------------------------------------------------------------------------------------------------------------------  Chemistries  Recent Labs  Lab 08/04/17 0425    NA 138  K 3.6  CL 100*  CO2 30  GLUCOSE 112*  BUN 17  CREATININE 0.65  CALCIUM 8.8*   ------------------------------------------------------------------------------------------------------------------ estimated creatinine clearance is 107.4 mL/min (by C-G formula based on SCr of 0.65 mg/dL). ------------------------------------------------------------------------------------------------------------------ No results for input(s): TSH, T4TOTAL, T3FREE, THYROIDAB in the last 72 hours.  Invalid input(s): FREET3  Coagulation profile No results for input(s): INR, PROTIME in the last 168 hours. ------------------------------------------------------------------------------------------------------------------- Recent Labs    08/04/17 0420  DDIMER 0.36   -------------------------------------------------------------------------------------------------------------------  Cardiac Enzymes No results for input(s): CKMB, TROPONINI, MYOGLOBIN in the last 168  hours.  Invalid input(s): CK ------------------------------------------------------------------------------------------------------------------    Component Value Date/Time   BNP 12.4 08/04/2017 0545   BNP 6.5 05/25/2015 1202     ---------------------------------------------------------------------------------------------------------------  Urinalysis    Component Value Date/Time   COLORURINE YELLOW 06/06/2017 1110   APPEARANCEUR CLEAR 06/06/2017 1110   LABSPEC 1.031 (H) 06/06/2017 1110   PHURINE 7.0 06/06/2017 1110   GLUCOSEU NEGATIVE 06/06/2017 1110   HGBUR NEGATIVE 06/06/2017 1110   BILIRUBINUR NEGATIVE 06/06/2017 1110   KETONESUR NEGATIVE 06/06/2017 1110   PROTEINUR NEGATIVE 06/06/2017 1110   UROBILINOGEN 0.2 02/28/2012 2004   NITRITE NEGATIVE 06/06/2017 1110   LEUKOCYTESUR NEGATIVE 06/06/2017 1110    ----------------------------------------------------------------------------------------------------------------    Imaging Results:    Dg Chest 2 View  Result Date: 08/04/2017 CLINICAL DATA:  Initial evaluation for acute chest pain. EXAM: CHEST - 2 VIEW COMPARISON:  Prior radiograph from 04/17/2017. FINDINGS: Cardiomegaly, stable.  Mediastinal silhouette within normal limits. Lungs mildly hypoinflated. Mild diffuse vascular and interstitial prominence, suggesting mild pulmonary interstitial congestion. No frank pulmonary edema. Superimposed left basilar atelectasis. No focal infiltrates. No pneumothorax. No acute osseus abnormality. Surgical clips overlie the lower mid neck. IMPRESSION: 1. Cardiomegaly with mild diffuse pulmonary interstitial congestion. 2. Superimposed left basilar atelectasis. No consolidative airspace disease. Electronically Signed   By: Jeannine Boga M.D.   On: 08/04/2017 06:20    EKG nsr at 85, nl axis, early R progression, no st-t changes c/w ischemia   Assessment & Plan:    Principal Problem:   COPD exacerbation (HCC) Active Problems:   OSA (obstructive sleep apnea)   Controlled type 2 diabetes mellitus without complication, without long-term current use of insulin (HCC)    Copd exacerbation Solumedrol 80mg  iv q8h Zithromax 500mg  iv qday Anoro 1puff qday Albuterol 1 neb tid and q6h prn  OSA (explains high co2) Pt intolerant of Cpap in the past, will try overnite  Chest pain Tele Trop I q6h x3 Cardiac echo NPO after [facility] consult by email  CHF (diastolic) Cont carvedilol Cont Lasix 40mg  po qday Cont Lisinopril 5mg  po qday  Dm2 Hold Metformin fsbs ac and qhs, ISS  Hypothyroidism Cont levothyroxine 296micrograms po qday  Gout Cont allopurinol 100mg  po qday  Anxiety Cont clonazepam, decrease to 0.5mg  po tid    DVT Prophylaxis  Lovenox - SCDs   AM Labs Ordered, also please review Full Orders  Family Communication: Admission, patients condition and plan of care including tests being ordered have been discussed with the patient  who  indicate understanding and agree with the plan and Code Status.  Code Status FULL CODE  Likely DC to  home  Condition GUARDED   Consults called:  Cardiology consult by Email regarding chest pain  Admission status: inpatient  Time spent in minutes :45   Jani Gravel M.D on 08/04/2017 at 9:23 AM  Between 7am to 7pm - Pager - [phone]   After 7pm go to [email or web address] - password [facility]  [facility] - Office  [phone]

## 2017-08-04 NOTE — ED Triage Notes (Signed)
Pt BIB [facility] for eval of substernal chest pain since Thursday. Pt w/ productive cough/sore throat since Thursday, seen at PCP at that time. Reports chest pain is worse today. Pt reports she had toenails removed from blt feet on Thursday, which chest pain started after that time.

## 2017-08-04 NOTE — ED Notes (Signed)
Pt refusing carb modified breakfast tray. Refused sugar alternatives for coffee.

## 2017-08-04 NOTE — ED Notes (Signed)
Pt oob to bathroom with steady gait with use of cane.

## 2017-08-05 ENCOUNTER — Inpatient Hospital Stay (HOSPITAL_COMMUNITY): Payer: Medicaid Other

## 2017-08-05 DIAGNOSIS — I361 Nonrheumatic tricuspid (valve) insufficiency: Secondary | ICD-10-CM

## 2017-08-05 DIAGNOSIS — I10 Essential (primary) hypertension: Secondary | ICD-10-CM

## 2017-08-05 DIAGNOSIS — J441 Chronic obstructive pulmonary disease with (acute) exacerbation: Principal | ICD-10-CM

## 2017-08-05 LAB — HIV ANTIBODY (ROUTINE TESTING W REFLEX): HIV SCREEN 4TH GENERATION: NONREACTIVE

## 2017-08-05 LAB — COMPREHENSIVE METABOLIC PANEL
ALBUMIN: 3.7 g/dL (ref 3.5–5.0)
ALT: 18 U/L (ref 14–54)
ANION GAP: 11 (ref 5–15)
AST: 13 U/L — ABNORMAL LOW (ref 15–41)
Alkaline Phosphatase: 70 U/L (ref 38–126)
BUN: 12 mg/dL (ref 6–20)
CHLORIDE: 97 mmol/L — AB (ref 101–111)
CO2: 31 mmol/L (ref 22–32)
Calcium: 8.2 mg/dL — ABNORMAL LOW (ref 8.9–10.3)
Creatinine, Ser: 0.55 mg/dL (ref 0.44–1.00)
GFR calc non Af Amer: >60 mL/min (ref >60)
Glucose, Bld: 135 mg/dL — ABNORMAL HIGH (ref 65–99)
Potassium: 3.9 mmol/L (ref 3.5–5.1)
Sodium: 139 mmol/L (ref 135–145)
Total Bilirubin: 0.4 mg/dL (ref 0.3–1.2)
Total Protein: 7.9 g/dL (ref 6.5–8.1)

## 2017-08-05 LAB — GLUCOSE, CAPILLARY
GLUCOSE-CAPILLARY: 143 mg/dL — AB (ref 65–99)
Glucose-Capillary: 162 mg/dL — ABNORMAL HIGH (ref 65–99)
Glucose-Capillary: 161 mg/dL — ABNORMAL HIGH (ref 65–99)

## 2017-08-05 LAB — LIPID PANEL
CHOL/HDL RATIO: 2.8 ratio
Cholesterol: 226 mg/dL — ABNORMAL HIGH (ref 0–200)
HDL: 80 mg/dL (ref >40)
LDL CALC: 134 mg/dL — AB (ref 0–99)
Triglycerides: 59 mg/dL (ref <150)
VLDL: 12 mg/dL (ref 0–40)

## 2017-08-05 LAB — CBC
HEMATOCRIT: 38.9 % (ref 36.0–46.0)
HEMOGLOBIN: 11.7 g/dL — AB (ref 12.0–15.0)
MCH: 28.3 pg (ref 26.0–34.0)
MCHC: 30.1 g/dL (ref 30.0–36.0)
MCV: 94 fL (ref 78.0–100.0)
Platelets: 341 10*3/uL (ref 150–400)
RBC: 4.14 MIL/uL (ref 3.87–5.11)
RDW: 14.2 % (ref 11.5–15.5)
WBC: 20.5 10*3/uL — ABNORMAL HIGH (ref 4.0–10.5)

## 2017-08-05 LAB — ECHOCARDIOGRAM COMPLETE
Height: 65 in
Weight: 4619.2 oz

## 2017-08-05 MED ORDER — FUROSEMIDE 40 MG PO TABS
40.0000 mg | ORAL_TABLET | Freq: Two times a day (BID) | ORAL | Status: DC
  Administered 2017-08-06: 40 mg via ORAL
  Filled 2017-08-05: qty 1

## 2017-08-05 MED ORDER — PREDNISONE 50 MG PO TABS
50.0000 mg | ORAL_TABLET | Freq: Every day | ORAL | Status: DC
  Administered 2017-08-06: 50 mg via ORAL
  Filled 2017-08-05: qty 1

## 2017-08-05 MED ORDER — INSULIN ASPART 100 UNIT/ML ~~LOC~~ SOLN
0.0000 [IU] | Freq: Three times a day (TID) | SUBCUTANEOUS | Status: DC
  Administered 2017-08-05 – 2017-08-06 (×2): 2 [IU] via SUBCUTANEOUS

## 2017-08-05 MED ORDER — AZITHROMYCIN 250 MG PO TABS
250.0000 mg | ORAL_TABLET | Freq: Every day | ORAL | Status: DC
  Administered 2017-08-06: 250 mg via ORAL
  Filled 2017-08-05: qty 1

## 2017-08-05 MED ORDER — GUAIFENESIN-DM 100-10 MG/5ML PO SYRP
5.0000 mL | ORAL_SOLUTION | ORAL | Status: DC | PRN
  Administered 2017-08-05 – 2017-08-06 (×4): 5 mL via ORAL
  Filled 2017-08-05 (×4): qty 5

## 2017-08-05 MED ORDER — FUROSEMIDE 10 MG/ML IJ SOLN
40.0000 mg | Freq: Once | INTRAMUSCULAR | Status: AC
  Administered 2017-08-05: 40 mg via INTRAVENOUS
  Filled 2017-08-05: qty 4

## 2017-08-05 MED ORDER — METOPROLOL SUCCINATE ER 25 MG PO TB24
25.0000 mg | ORAL_TABLET | Freq: Every day | ORAL | Status: DC
  Administered 2017-08-06: 25 mg via ORAL
  Filled 2017-08-05: qty 1

## 2017-08-05 MED ORDER — ALBUTEROL SULFATE (2.5 MG/3ML) 0.083% IN NEBU
2.5000 mg | INHALATION_SOLUTION | Freq: Three times a day (TID) | RESPIRATORY_TRACT | Status: DC
  Administered 2017-08-06: 2.5 mg via RESPIRATORY_TRACT
  Filled 2017-08-05 (×2): qty 3

## 2017-08-05 NOTE — Progress Notes (Signed)
Completed dressing change on toes bilaterally with guaze and placed pt home boots on per request

## 2017-08-05 NOTE — Progress Notes (Signed)
  Echocardiogram 2D Echocardiogram has been performed.  Merrie Roof F 08/05/2017, 3:09 PM

## 2017-08-05 NOTE — Progress Notes (Addendum)
Progress Note  Patient Name: Michele Owens Date of Encounter: 08/05/2017  Primary Cardiologist: No primary care provider on file.   Subjective   Denies any chest pain and SOB is much improved.  She says she feels much better today  Inpatient Medications    Scheduled Meds: . albuterol  2.5 mg Nebulization TID  . allopurinol  100 mg Oral Daily  . calcitRIOL  0.5 mcg Oral Daily  . carvedilol  6.25 mg Oral BID WC  . dicyclomine  20 mg Oral BID  . enoxaparin (LOVENOX) injection  40 mg Subcutaneous Q24H  . ferrous sulfate  325 mg Oral Daily  . furosemide  40 mg Oral BID  . levothyroxine  224 mcg Oral QAC breakfast  . lisinopril  5 mg Oral Daily  . methylPREDNISolone (SOLU-MEDROL) injection  80 mg Intravenous Q8H  . mirtazapine  15 mg Oral QHS  . pantoprazole  40 mg Oral Daily  . potassium chloride SA  40 mEq Oral Daily  . pravastatin  40 mg Oral QPM  . senna-docusate  1 tablet Oral QHS  . sodium chloride flush  3 mL Intravenous Q12H  . umeclidinium-vilanterol  1 puff Inhalation Daily   Continuous Infusions: . sodium chloride    . azithromycin Stopped (08/05/17 0929)   PRN Meds: sodium chloride, acetaminophen **OR** acetaminophen, albuterol, clonazePAM, guaiFENesin-dextromethorphan, HYDROcodone-acetaminophen, loratadine, promethazine, sodium chloride flush   Vital Signs    Vitals:   08/04/17 2124 08/04/17 2339 08/05/17 0534 08/05/17 0919  BP:  113/67 121/74   Pulse:  84 87   Resp:  20 20   Temp:  98.2 F (36.8 C) 98.2 F (36.8 C)   TempSrc:  Oral Oral   SpO2: 93% 93% 93% 93%  Weight:   288 lb 11.2 oz (131 kg)   Height:        Intake/Output Summary (Last 24 hours) at 08/05/2017 1056 Last data filed at 08/05/2017 0959 Gross per 24 hour  Intake 610 ml  Output 1000 ml  Net -390 ml   Filed Weights   08/04/17 0424 08/05/17 0534  Weight: 295 lb (133.8 kg) 288 lb 11.2 oz (131 kg)    Telemetry    Sinus rhythm- Personally Reviewed  ECG    No new EKG to  review- Personally Reviewed  Physical Exam   GEN: No acute distress.   Neck: No JVD Cardiac: RRR, no murmurs, rubs, or gallops.  Respiratory: Clear to auscultation bilaterally. GI: Soft, nontender, non-distended  MS: Trace lower extremity edema; No deformity. Neuro:  Nonfocal  Psych: Normal affect   Labs    Chemistry Recent Labs  Lab 08/04/17 0425 08/05/17 0318  NA 138 139  K 3.6 3.9  CL 100* 97*  CO2 30 31  GLUCOSE 112* 135*  BUN 17 12  CREATININE 0.65 0.55  CALCIUM 8.8* 8.2*  PROT  --  7.9  ALBUMIN  --  3.7  AST  --  13*  ALT  --  18  ALKPHOS  --  70  BILITOT  --  0.4  GFRNONAA >60 >60  GFRAA >60 >60  ANIONGAP 8 11     Hematology Recent Labs  Lab 08/04/17 0425 08/05/17 0318  WBC 19.0* 20.5*  RBC 4.04 4.14  HGB 11.7* 11.7*  HCT 37.4 38.9  MCV 92.6 94.0  MCH 29.0 28.3  MCHC 31.3 30.1  RDW 14.1 14.2  PLT 305 341    Cardiac Enzymes Recent Labs  Lab 08/04/17 1124 08/04/17 1601 08/04/17 2214  TROPONINI <0.03 <0.03 <0.03    Recent Labs  Lab 08/04/17 0431  TROPIPOC 0.00     BNP Recent Labs  Lab 08/04/17 0545  BNP 12.4     DDimer  Recent Labs  Lab 08/04/17 0420  DDIMER 0.36     Radiology    Dg Chest 2 View  Result Date: 08/04/2017 CLINICAL DATA:  Initial evaluation for acute chest pain. EXAM: CHEST - 2 VIEW COMPARISON:  Prior radiograph from 04/17/2017. FINDINGS: Cardiomegaly, stable.  Mediastinal silhouette within normal limits. Lungs mildly hypoinflated. Mild diffuse vascular and interstitial prominence, suggesting mild pulmonary interstitial congestion. No frank pulmonary edema. Superimposed left basilar atelectasis. No focal infiltrates. No pneumothorax. No acute osseus abnormality. Surgical clips overlie the lower mid neck. IMPRESSION: 1. Cardiomegaly with mild diffuse pulmonary interstitial congestion. 2. Superimposed left basilar atelectasis. No consolidative airspace disease. Electronically Signed   By: Jeannine Boga M.D.    On: 08/04/2017 06:20    Cardiac Studies   none  Patient Profile     57 y.o. female with a history of chronic diastolic CHF, COPD on home O2, DM, GERD, HTN, SVT and OSA.  She presented to ER with complaints of chest pain in the mid upper chest associated with cough productive of yellow sputum and SOB.  She initially thought she had a cold but when sx continued she went to the ER for further evaluation.   Assessment & Plan    1.  Chest pain  - trop negative x 3 - likely related to COPD exacerbation - normal coronary arteries by cath 06/2015 - EKG with no acute ST changes -2D echocardiogram pending -if no regional wall motion normalities and LV function is normal then no further ischemic work-up is necessary at this time - her pain is very reproducible with palpation of her chest wall and started after she started having a cough and is worse with breathing and cough c/w MSK etiology  2.  Acute on chronic diastolic CHF - BNP is normal (although may be falsely low with obesity) but cxray shows mild diffuse pulmonary interstitial congestion -echo a year ago with normal LVF with EF 55-60% -Echo is pending to make sure LV function remains preserved -weight was 273lbs at OV with AHF clinic a year ago -Weight on admission was 295 pounds but difficult to determine if this is just due to weight gain from food consumption as her last weight was a year ago -She was given 40mg  IV Lasix yesterday and put out 1 L of fluid and is net -390 cc -creatinine stable at 0.55 -She still has lower extremity edema on exam so would give another dose of Lasix 40 mg IV today and reassess in the morning  3.  Moderate pulmonary HTN  - likely combination of WHO group 2 and 3 from COPD and pulmonary venous HTN from CHF - RHC 2017 with mild pulmonary HTN - continue diuretics - Echo pending  4.  Acute on chronic COPD exacerbation - continue IV steroids, antibx and inhalers per [facility] - she is on home O2 - last  PFTs with severe obstruction  5.  HTN - BP control is good with BP 121/74 mmHg this morning - continue ACE I - Change carvedilol to Toprol-XL 25 mg daily for more beta-1 selectivity given her underlying COPD exacerbation and history of asthma   For questions or updates, please contact [facility] Please consult [email or web address] for contact info under Cardiology/STEMI.      Signed, Fransico Him, MD  08/05/2017, 10:56 AM

## 2017-08-05 NOTE — Consult Note (Signed)
[place] Nurse wound consult note Reason for Consult: Post procedural care of bilateral great toes and right foot 2nd digit. Patient had nail removal at [facility] on Thursday. Wound type: Pressure Injury POA: NA Measurement: Left great toe with nail removal.  Site looks good. Patient was instructed to use dry dressings once daily so I will continue those orders Right great toe removal. Small area that is still moist (0.4cm round) Right foot, 2nd toe with moisture and maceration distal to nail. Wound bed:As described above Drainage (amount, consistency, odor) scant moisture to left foot 2nd digit, serous Periwound: macerated as noted above Dressing procedure/placement/frequency: Dry dressings after saline cleanse with daily changes. Orders provided for nursing.  [facility] nursing team will not follow, but will remain available to this patient, the nursing and medical teams.  Please re-consult if needed. Thanks, Maudie Flakes, MSN, RN, [place], Arther Abbott  Pager# [phone]

## 2017-08-05 NOTE — Progress Notes (Signed)
MD, pt may need a wound consult for pt's bil feet incision, thanks Arvella Nigh RN.

## 2017-08-05 NOTE — Progress Notes (Signed)
Pt has NPO order  Pt states she has eaten over night and coffee, and water, and ginger ale this morning  Pt re educated on NPO status  Pt verbalizes understanding at this time  Will inform MD

## 2017-08-05 NOTE — Progress Notes (Signed)
Patient educated on Npo status at midnight for stress test in the morning.. Snacks given per patient's request. Patient up in chair. No complaints at this time. Will continue to monitor patient.

## 2017-08-05 NOTE — Progress Notes (Signed)
PROGRESS NOTE    Michele Owens  KYH:[id number] DOB: February 15, 1961 DOA: 08/04/2017 PCP: [name], MD     Brief Narrative:  Michele Owens is a 57 yo female with past medical history significant for HTN, DM, history of DVT, diastolic heart failure, GERD, COPD, chronic hypoxemic respiratory failure on home nasal cannula O2 who presented with complaints of chest discomfort in the mid upper chest since last Thursday associated with cough with yellow sputum, dyspnea.  Patient thought that she had a cold and was not getting better and therefore presented to the ED for valuation.  In the emergency department, chest x-ray revealed cardiomegaly with mild diffuse pulmonary interstitial congestion, superimposed left basilar atelectasis.  Troponin was trended which remained negative x4.  Cardiology was admitted for chest pain evaluation.  Assessment & Plan:   Principal Problem:   COPD exacerbation ([place]) Active Problems:   OSA (obstructive sleep apnea)   Controlled type 2 diabetes mellitus without complication, without long-term current use of insulin (HCC)  Dyspnea secondary to acute COPD exacerbation -Transition Solu-Medrol to oral prednisone tomorrow -Continue Zithromax -Continue nebulizer treatments -Improving  Atypical chest pain, likely musculoskeletal -Troponin has been negative x3 -Echocardiogram without wall motion abnormalities, EF 60-65%, grade 1 dd -Cardiology following  Chronic hypoxemic and hypercapnic respiratory failure -Uses [place] O2 intermittently at home, 2L   Acute on chronic diastolic heart failure -Continue IV Lasix today, transition to p.o. tomorrow -Continue toprol, lisinopril  Hyperlipidemia -Continue pravachol   Reactive leukocytosis -Without acute evidence of infectious etiology, patient is on IV steroids -Monitor CBC  Diabetes mellitus type 2 -Hold metformin -Sliding scale insulin  Hypothyroidism -Continue Synthroid  Gout -Continue  allopurinol  Anxiety -Continue Klonopin  GERD -Continue PPI   Recent bilateral great toe nail removal -Wound care RN consulted at RN request -Follow-up with podiatry   DVT prophylaxis: Lovenox Code Status: Full Family Communication: No family at bedside Disposition Plan: Pending improvement   Consultants:   Cardiology  Procedures:   None  Antimicrobials:  Anti-infectives (From admission, onward)   Start     Dose/Rate Route Frequency Ordered Stop   08/06/17 1000  azithromycin (ZITHROMAX) tablet 250 mg     250 mg Oral Daily 08/05/17 1447     08/04/17 1030  azithromycin (ZITHROMAX) 500 mg in sodium chloride 0.9 % 250 mL IVPB  Status:  Discontinued     500 mg 250 mL/hr over 60 Minutes Intravenous Every 24 hours 08/04/17 1016 08/05/17 1447       Subjective: Feeling much better today.  Denies chest pain today.  Breathing is much better as well as swelling.  Objective: Vitals:   08/05/17 0534 08/05/17 0919 08/05/17 1203 08/05/17 1545  BP: 121/74  112/65   Pulse: 87  98   Resp: 20  18   Temp: 98.2 F (36.8 C)  97.9 F (36.6 C)   TempSrc: Oral  Oral   SpO2: 93% 93% 94% 93%  Weight: 131 kg (288 lb 11.2 oz)     Height:        Intake/Output Summary (Last 24 hours) at 08/05/2017 1547 Last data filed at 08/05/2017 1500 Gross per 24 hour  Intake 1100 ml  Output 1600 ml  Net -500 ml   Filed Weights   08/04/17 0424 08/05/17 0534  Weight: 133.8 kg (295 lb) 131 kg (288 lb 11.2 oz)    Examination:  General exam: Appears calm and comfortable  Respiratory system: Clear to auscultation. Respiratory effort normal. Cardiovascular system: S1 & S2  heard, RRR. No JVD, murmurs, rubs, gallops or clicks. Trace pedal edema. Gastrointestinal system: Abdomen is nondistended, soft and nontender. No organomegaly or masses felt. Normal bowel sounds heard. Central nervous system: Alert and oriented. No focal neurological deficits. Extremities: Symmetric 5 x 5 power. Skin: No  rashes, lesions or ulcers Psychiatry: Judgement and insight appear normal. Mood & affect appropriate.   Data Reviewed: I have personally reviewed following labs and imaging studies  CBC: Recent Labs  Lab 08/04/17 0420 08/04/17 0425 08/05/17 0318  WBC  --  19.0* 20.5*  NEUTROABS 13.7*  --   --   HGB  --  11.7* 11.7*  HCT  --  37.4 38.9  MCV  --  92.6 94.0  PLT  --  305 270   Basic Metabolic Panel: Recent Labs  Lab 08/04/17 0425 08/05/17 0318  NA 138 139  K 3.6 3.9  CL 100* 97*  CO2 30 31  GLUCOSE 112* 135*  BUN 17 12  CREATININE 0.65 0.55  CALCIUM 8.8* 8.2*   GFR: Estimated Creatinine Clearance: 106.1 mL/min (by C-G formula based on SCr of 0.55 mg/dL). Liver Function Tests: Recent Labs  Lab 08/05/17 0318  AST 13*  ALT 18  ALKPHOS 70  BILITOT 0.4  PROT 7.9  ALBUMIN 3.7   No results for input(s): LIPASE, AMYLASE in the last 168 hours. No results for input(s): AMMONIA in the last 168 hours. Coagulation Profile: No results for input(s): INR, PROTIME in the last 168 hours. Cardiac Enzymes: Recent Labs  Lab 08/04/17 1124 08/04/17 1601 08/04/17 2214  TROPONINI <0.03 <0.03 <0.03   BNP (last 3 results) No results for input(s): PROBNP in the last 8760 hours. HbA1C: No results for input(s): HGBA1C in the last 72 hours. CBG: Recent Labs  Lab 08/04/17 1613 08/04/17 2143 08/05/17 0733  GLUCAP 116* 151* 143*   Lipid Profile: Recent Labs    08/05/17 0318  CHOL 226*  HDL 80  LDLCALC 134*  TRIG 59  CHOLHDL 2.8   Thyroid Function Tests: No results for input(s): TSH, T4TOTAL, FREET4, T3FREE, THYROIDAB in the last 72 hours. Anemia Panel: No results for input(s): VITAMINB12, FOLATE, FERRITIN, TIBC, IRON, RETICCTPCT in the last 72 hours. Sepsis Labs: Recent Labs  Lab 08/04/17 0543  LATICACIDVEN 0.70    No results found for this or any previous visit (from the past 240 hour(s)).     Radiology Studies: Dg Chest 2 View  Result Date:  08/04/2017 CLINICAL DATA:  Initial evaluation for acute chest pain. EXAM: CHEST - 2 VIEW COMPARISON:  Prior radiograph from 04/17/2017. FINDINGS: Cardiomegaly, stable.  Mediastinal silhouette within normal limits. Lungs mildly hypoinflated. Mild diffuse vascular and interstitial prominence, suggesting mild pulmonary interstitial congestion. No frank pulmonary edema. Superimposed left basilar atelectasis. No focal infiltrates. No pneumothorax. No acute osseus abnormality. Surgical clips overlie the lower mid neck. IMPRESSION: 1. Cardiomegaly with mild diffuse pulmonary interstitial congestion. 2. Superimposed left basilar atelectasis. No consolidative airspace disease. Electronically Signed   By: Jeannine Boga M.D.   On: 08/04/2017 06:20      Scheduled Meds: . albuterol  2.5 mg Nebulization TID  . allopurinol  100 mg Oral Daily  . [START ON 08/06/2017] azithromycin  250 mg Oral Daily  . calcitRIOL  0.5 mcg Oral Daily  . dicyclomine  20 mg Oral BID  . enoxaparin (LOVENOX) injection  40 mg Subcutaneous Q24H  . ferrous sulfate  325 mg Oral Daily  . [START ON 08/06/2017] furosemide  40 mg Oral BID  .  insulin aspart  0-9 Units Subcutaneous TID WC  . levothyroxine  224 mcg Oral QAC breakfast  . lisinopril  5 mg Oral Daily  . [START ON 08/06/2017] metoprolol succinate  25 mg Oral Daily  . mirtazapine  15 mg Oral QHS  . pantoprazole  40 mg Oral Daily  . potassium chloride SA  40 mEq Oral Daily  . pravastatin  40 mg Oral QPM  . [START ON 08/06/2017] predniSONE  50 mg Oral Q breakfast  . senna-docusate  1 tablet Oral QHS  . sodium chloride flush  3 mL Intravenous Q12H  . umeclidinium-vilanterol  1 puff Inhalation Daily   Continuous Infusions: . sodium chloride       LOS: 1 day    Time spent: 30 minutes   [name], DO [facility] [email or web address] Password [facility] 08/05/2017, 3:47 PM

## 2017-08-06 LAB — BASIC METABOLIC PANEL
ANION GAP: 8 (ref 5–15)
BUN: 14 mg/dL (ref 6–20)
CHLORIDE: 98 mmol/L — AB (ref 101–111)
CO2: 32 mmol/L (ref 22–32)
Calcium: 7.7 mg/dL — ABNORMAL LOW (ref 8.9–10.3)
Creatinine, Ser: 0.61 mg/dL (ref 0.44–1.00)
GFR calc non Af Amer: >60 mL/min (ref >60)
GLUCOSE: 105 mg/dL — AB (ref 65–99)
Potassium: 4.3 mmol/L (ref 3.5–5.1)
Sodium: 138 mmol/L (ref 135–145)

## 2017-08-06 LAB — GLUCOSE, CAPILLARY
Glucose-Capillary: 118 mg/dL — ABNORMAL HIGH (ref 65–99)
Glucose-Capillary: 170 mg/dL — ABNORMAL HIGH (ref 65–99)

## 2017-08-06 LAB — CBC
HCT: 37.3 % (ref 36.0–46.0)
HEMOGLOBIN: 12.2 g/dL (ref 12.0–15.0)
MCH: 31 pg (ref 26.0–34.0)
MCHC: 32.7 g/dL (ref 30.0–36.0)
MCV: 94.9 fL (ref 78.0–100.0)
Platelets: 366 10*3/uL (ref 150–400)
RBC: 3.93 MIL/uL (ref 3.87–5.11)
RDW: 14.7 % (ref 11.5–15.5)
WBC: 22.1 10*3/uL — ABNORMAL HIGH (ref 4.0–10.5)

## 2017-08-06 MED ORDER — PREDNISONE 10 MG PO TABS: ORAL_TABLET | ORAL | 0 refills | Status: DC

## 2017-08-06 MED ORDER — GUAIFENESIN-DM 100-10 MG/5ML PO SYRP: 5.0000 mL | ORAL_SOLUTION | ORAL | 0 refills | Status: DC | PRN

## 2017-08-06 MED ORDER — METOPROLOL SUCCINATE ER 25 MG PO TB24: 25.0000 mg | ORAL_TABLET | Freq: Every day | ORAL | 0 refills | Status: DC

## 2017-08-06 MED ORDER — GLUCOSE BLOOD VI STRP: ORAL_STRIP | 0 refills | Status: DC

## 2017-08-06 MED ORDER — HYDROCODONE-ACETAMINOPHEN 5-325 MG PO TABS
1.0000 | ORAL_TABLET | ORAL | Status: DC | PRN
  Administered 2017-08-06: 2 via ORAL
  Filled 2017-08-06: qty 2

## 2017-08-06 MED ORDER — AZITHROMYCIN 250 MG PO TABS: 250.0000 mg | ORAL_TABLET | Freq: Every day | ORAL | 0 refills | Status: DC

## 2017-08-06 MED ORDER — FUROSEMIDE 40 MG PO TABS: 40.0000 mg | ORAL_TABLET | Freq: Two times a day (BID) | ORAL | Status: DC

## 2017-08-06 NOTE — Progress Notes (Signed)
Patient is alert and oriented, vital signs are stable, discharge instructions reviewed with patient, patient to follow up with MD, prescription called in to pharmacy, will continue to monitor Neta Mends RN 2:56 PM 08-06-2017

## 2017-08-06 NOTE — Discharge Summary (Addendum)
Physician Discharge Summary  Dee Paden ERX:[id number] DOB: [birth year] DOA: 08/04/2017  PCP: [name], MD  Admit date: 08/04/2017 Discharge date: 08/06/2017  Admitted From: Home Disposition:  Home  Recommendations for Outpatient Follow-up:  1. Follow up with PCP in 1 week 2. Repeat CBC once off prednisone taper recheck leukocytosis  Discharge Condition: Stable CODE STATUS: Full  Diet recommendation: Heart healthy   Brief/Interim Summary: Michele Owens is a 57 yo female with past medical history significant for HTN, DM, history of DVT, diastolic heart failure, GERD, COPD, chronic hypoxemic respiratory failure on home nasal cannula O2 who presented with complaints of chest discomfort in the mid upper chest since last Thursday associated with cough with yellow sputum, dyspnea.  Patient thought that she had a cold and was not getting better and therefore presented to the ED for valuation.  In the emergency department, chest x-ray revealed cardiomegaly with mild diffuse pulmonary interstitial congestion, superimposed left basilar atelectasis.  Troponin was trended which remained negative x4.  Cardiology was admitted for chest pain evaluation.  It was determined that her atypical chest pain was likely musculoskeletal in nature.  She underwent echocardiogram without wall motion abnormalities, EF 60 to 95%, grade 1 diastolic dysfunction.  She was treated for her COPD exacerbation with Solu-Medrol, Zithromax, nebulizer treatments with improvement.  She was also given IV Lasix for diuresis with improvement, transition to oral Lasix.  On day of discharge, she denied any shortness of breath, chest pain and was feeling well overall.  Discharge plan reviewed with patient.  Discharge Diagnoses:  Principal Problem:   COPD exacerbation ([place]) Active Problems:   OSA (obstructive sleep apnea)   Controlled type 2 diabetes mellitus without complication, without long-term current use of insulin  (HCC)   Dyspnea secondary to acute COPD exacerbation -Prednisone taper at discharge -Continue Zithromax -Continue nebulizer treatments -Improving  Atypical chest pain, likely musculoskeletal -Troponin has been negative x3 -Echocardiogram without wall motion abnormalities, EF 60-65%, grade 1 dd -Cardiology consulted  Acute on chronic hypoxemic and hypercapnic respiratory failure -Uses [place] O2 intermittently at home, 2L   Acute on chronic diastolic heart failure -Transition back to home Lasix p.o. -Continue toprol, lisinopril  Hyperlipidemia -Continue pravachol   Reactive leukocytosis -Without acute evidence of infectious etiology, patient is on steroids -Monitor CBC  Diabetes mellitus type 2 -Hold metformin -Sliding scale insulin  Hypothyroidism -Continue Synthroid  Gout -Continue allopurinol  Anxiety -Continue Klonopin  GERD -Continue PPI   Recent bilateral great toe nail removal -Follow-up with podiatry     Discharge Instructions  Discharge Instructions    (HEART FAILURE PATIENTS) Call MD:  Anytime you have any of the following symptoms: 1) 3 pound weight gain in 24 hours or 5 pounds in 1 week 2) shortness of breath, with or without a dry hacking cough 3) swelling in the hands, feet or stomach 4) if you have to sleep on extra pillows at night in order to breathe.   Complete by:  As directed    Call MD for:  difficulty breathing, headache or visual disturbances   Complete by:  As directed    Call MD for:  extreme fatigue   Complete by:  As directed    Call MD for:  persistant dizziness or light-headedness   Complete by:  As directed    Call MD for:  persistant nausea and vomiting   Complete by:  As directed    Call MD for:  severe uncontrolled pain   Complete by:  As  directed    Call MD for:  temperature >100.4   Complete by:  As directed    Diet - low sodium heart healthy   Complete by:  As directed    Discharge instructions   Complete  by:  As directed    You were cared for by a hospitalist during your hospital stay. If you have any questions about your discharge medications or the care you received while you were in the hospital after you are discharged, you can call the unit and ask to speak with the hospitalist on call if the hospitalist that took care of you is not available. Once you are discharged, your primary care physician will handle any further medical issues. Please note that NO REFILLS for any discharge medications will be authorized once you are discharged, as it is imperative that you return to your primary care physician (or establish a relationship with a primary care physician if you do not have one) for your aftercare needs so that they can reassess your need for medications and monitor your lab values.   Increase activity slowly   Complete by:  As directed      Allergies as of 08/06/2017      Reactions   Bee Venom Swelling, Other (See Comments)   "All over my body" (swelling)   Ibuprofen Rash, Other (See Comments)   Severe rash   Lamisil [terbinafine] Rash, Other (See Comments)   Pt states this causes her to "feel funny"   Nsaids Other (See Comments)   Per MD's orders       Medication List    STOP taking these medications   Butalbital-APAP-Caffeine 50-300-40 MG Caps   carvedilol 6.25 MG tablet Commonly known as:  COREG     TAKE these medications   albuterol 1.25 MG/3ML nebulizer solution Commonly known as:  ACCUNEB Take 1 ampule by nebulization 2 (two) times daily. What changed:  Another medication with the same name was changed. Make sure you understand how and when to take each.   albuterol 108 (90 Base) MCG/ACT inhaler Commonly known as:  PROVENTIL HFA;VENTOLIN HFA Inhale 1-2 puffs into the lungs every 6 (six) hours as needed for wheezing. What changed:    how much to take  when to take this   allopurinol 100 MG tablet Commonly known as:  ZYLOPRIM Take 100 mg by mouth daily.    aspirin-acetaminophen-caffeine 250-250-65 MG tablet Commonly known as:  EXCEDRIN MIGRAINE Take 2 tablets by mouth daily as needed for headache.   azithromycin 250 MG tablet Commonly known as:  ZITHROMAX Take 1 tablet (250 mg total) by mouth daily.   budesonide-formoterol 80-4.5 MCG/ACT inhaler Commonly known as:  SYMBICORT Inhale 2 puffs into the lungs 2 (two) times daily.   calcitRIOL 0.5 MCG capsule Commonly known as:  ROCALTROL Take 1 capsule (0.5 mcg total) by mouth daily.   cetirizine 10 MG tablet Commonly known as:  ZYRTEC Take 10 mg by mouth daily.   clindamycin 150 MG capsule Commonly known as:  CLEOCIN Take 150 mg by mouth 3 (three) times daily.   clonazePAM 2 MG tablet Commonly known as:  KLONOPIN Take 1 mg by mouth 3 (three) times daily. for anxiety   diclofenac sodium 1 % Gel Commonly known as:  VOLTAREN Apply 2 g topically 4 (four) times daily as needed (for pain).   dicyclomine 20 MG tablet Commonly known as:  BENTYL Take 1 tablet (20 mg total) by mouth 2 (two) times daily.   EPINEPHrine  0.3 mg/0.3 mL Soaj injection Commonly known as:  EPI-PEN Inject 0.3 mg into the muscle daily as needed (allergic reaction).   FEROSUL 325 (65 FE) MG tablet Generic drug:  ferrous sulfate Take 325 mg by mouth daily.   furosemide 40 MG tablet Commonly known as:  LASIX Take 1 tablet (40 mg total) by mouth 2 (two) times daily.   glucose blood test strip Use as instructed to check blood sugar up to 4 times daily   guaiFENesin-dextromethorphan 100-10 MG/5ML syrup Commonly known as:  ROBITUSSIN DM Take 5 mLs by mouth every 4 (four) hours as needed for cough.   HYDROcodone-acetaminophen 10-325 MG tablet Commonly known as:  NORCO Take 1 tablet by mouth every 6 (six) hours as needed. For up to 7 days.   ipratropium-albuterol 0.5-2.5 (3) MG/3ML Soln Commonly known as:  DUONEB Use one vial in Nebulizer four times daily as directed.   levothyroxine 112 MCG  tablet Commonly known as:  SYNTHROID, LEVOTHROID Take 224 mcg by mouth daily.   lisinopril 5 MG tablet Commonly known as:  PRINIVIL,ZESTRIL Take 1 tablet (5 mg total) by mouth daily. Needs office visit   metFORMIN 500 MG tablet Commonly known as:  GLUCOPHAGE Take 1 tablet (500 mg total) by mouth 2 (two) times daily with a meal.   metoprolol succinate 25 MG 24 hr tablet Commonly known as:  TOPROL-XL Take 1 tablet (25 mg total) by mouth daily. Start taking on:  08/07/2017   nicotine 14 mg/24hr patch Commonly known as:  NICODERM CQ - dosed in mg/24 hours Place 1 patch (14 mg total) onto the skin daily. What changed:  additional instructions   nystatin cream Commonly known as:  MYCOSTATIN APPLY TO THE AFFECTED AREA(S) TWICE DAILY AS NEEDED   omeprazole 40 MG capsule Commonly known as:  PRILOSEC Take 40 mg by mouth daily.   potassium chloride SA 20 MEQ tablet Commonly known as:  K-DUR,KLOR-CON Take 2 tablets (40 mEq total) by mouth daily. What changed:  how much to take   pravastatin 40 MG tablet Commonly known as:  PRAVACHOL Take 40 mg by mouth every evening.   predniSONE 10 MG tablet Commonly known as:  DELTASONE Take 4 tabs for 3 days, then 3 tabs for 3 days, then 2 tabs for 3 days, then 1 tab for 3 days, then 1/2 tab for 4 days. What changed:    medication strength  additional instructions   promethazine 25 MG tablet Commonly known as:  PHENERGAN Take 1 tablet (25 mg total) by mouth every 8 (eight) hours as needed.   QC STOOL SOFTENER PLS LAXATIVE 8.6-50 MG tablet Generic drug:  senna-docusate Take 1 tablet by mouth at bedtime.   tiotropium 18 MCG inhalation capsule Commonly known as:  SPIRIVA Place 18 mcg into inhaler and inhale daily.      Follow-up Information    [name], MD. Schedule an appointment as soon as possible for a visit in 1 week(s).   Specialty:  Internal Medicine Contact information: [place]  [id number] [phone]          Allergies  Allergen Reactions  . Bee Venom Swelling and Other (See Comments)    "All over my body" (swelling)  . Ibuprofen Rash and Other (See Comments)    Severe rash  . Lamisil [Terbinafine] Rash and Other (See Comments)    Pt states this causes her to "feel funny"  . Nsaids Other (See Comments)  Per MD's orders     Consultations:  Cardiology    Procedures/Studies: Dg Chest 2 View  Result Date: 08/04/2017 CLINICAL DATA:  Initial evaluation for acute chest pain. EXAM: CHEST - 2 VIEW COMPARISON:  Prior radiograph from 04/17/2017. FINDINGS: Cardiomegaly, stable.  Mediastinal silhouette within normal limits. Lungs mildly hypoinflated. Mild diffuse vascular and interstitial prominence, suggesting mild pulmonary interstitial congestion. No frank pulmonary edema. Superimposed left basilar atelectasis. No focal infiltrates. No pneumothorax. No acute osseus abnormality. Surgical clips overlie the lower mid neck. IMPRESSION: 1. Cardiomegaly with mild diffuse pulmonary interstitial congestion. 2. Superimposed left basilar atelectasis. No consolidative airspace disease. Electronically Signed   By: Jeannine Boga M.D.   On: 08/04/2017 06:20    Echo 4/28 Study Conclusions  - Left ventricle: The cavity size was normal. There was moderate   concentric hypertrophy. Systolic function was normal. The   estimated ejection fraction was in the range of 60% to 65%. Wall   motion was normal; there were no regional wall motion   abnormalities. Doppler parameters are consistent with abnormal   left ventricular relaxation (grade 1 diastolic dysfunction).   There was no evidence of elevated ventricular filling pressure by   Doppler parameters. - Aortic valve: There was no regurgitation. - Mitral valve: Calcified annulus. Mildly thickened leaflets .   There was mild regurgitation. - Left atrium: The atrium was moderately dilated. - Right ventricle: The cavity  size was normal. Wall thickness was   normal. Systolic function was normal. - Right atrium: The atrium was normal in size. - Tricuspid valve: There was mild regurgitation. - Pulmonic valve: There was no regurgitation. - Pulmonary arteries: Systolic pressure was within the normal   range. - Inferior vena cava: The vessel was normal in size. - Pericardium, extracardiac: There was no pericardial effusion.    Discharge Exam: Vitals:   08/06/17 0752 08/06/17 1351  BP:  132/84  Pulse:  94  Resp:  18  Temp:  98.6 F (37 C)  SpO2: 93% 90%    General: Pt is alert, awake, not in acute distress Cardiovascular: RRR, S1/S2 +, no rubs, no gallops Respiratory: CTA bilaterally, no wheezing, no rhonchi Abdominal: Soft, NT, ND, bowel sounds + Extremities: no edema, no cyanosis    The results of significant diagnostics from this hospitalization (including imaging, microbiology, ancillary and laboratory) are listed below for reference.     Microbiology: No results found for this or any previous visit (from the past 240 hour(s)).   Labs: BNP (last 3 results) Recent Labs    10/02/16 1658 04/17/17 0940 08/04/17 0545  BNP 31.3 34.9 68.3   Basic Metabolic Panel: Recent Labs  Lab 08/04/17 0425 08/05/17 0318 08/06/17 0708  NA 138 139 138  K 3.6 3.9 4.3  CL 100* 97* 98*  CO2 30 31 32  GLUCOSE 112* 135* 105*  BUN 17 12 14   CREATININE 0.65 0.55 0.61  CALCIUM 8.8* 8.2* 7.7*   Liver Function Tests: Recent Labs  Lab 08/05/17 0318  AST 13*  ALT 18  ALKPHOS 70  BILITOT 0.4  PROT 7.9  ALBUMIN 3.7   No results for input(s): LIPASE, AMYLASE in the last 168 hours. No results for input(s): AMMONIA in the last 168 hours. CBC: Recent Labs  Lab 08/04/17 0420 08/04/17 0425 08/05/17 0318 08/06/17 0708  WBC  --  19.0* 20.5* 22.1*  NEUTROABS 13.7*  --   --   --   HGB  --  11.7* 11.7* 12.2  HCT  --  37.4 38.9 37.3  MCV  --  92.6 94.0 94.9  PLT  --  305 341 366   Cardiac  Enzymes: Recent Labs  Lab 08/04/17 1124 08/04/17 1601 08/04/17 2214  TROPONINI <0.03 <0.03 <0.03   BNP: Invalid input(s): POCBNP CBG: Recent Labs  Lab 08/05/17 0733 08/05/17 1609 08/05/17 2030 08/06/17 0800 08/06/17 1217  GLUCAP 143* 162* 161* 118* 170*   D-Dimer Recent Labs    08/04/17 0420  DDIMER 0.36   Hgb A1c No results for input(s): HGBA1C in the last 72 hours. Lipid Profile Recent Labs    08/05/17 0318  CHOL 226*  HDL 80  LDLCALC 134*  TRIG 59  CHOLHDL 2.8   Thyroid function studies No results for input(s): TSH, T4TOTAL, T3FREE, THYROIDAB in the last 72 hours.  Invalid input(s): FREET3 Anemia work up No results for input(s): VITAMINB12, FOLATE, FERRITIN, TIBC, IRON, RETICCTPCT in the last 72 hours. Urinalysis    Component Value Date/Time   COLORURINE YELLOW 06/06/2017 1110   APPEARANCEUR CLEAR 06/06/2017 1110   LABSPEC 1.031 (H) 06/06/2017 1110   PHURINE 7.0 06/06/2017 1110   GLUCOSEU NEGATIVE 06/06/2017 1110   HGBUR NEGATIVE 06/06/2017 1110   BILIRUBINUR NEGATIVE 06/06/2017 1110   KETONESUR NEGATIVE 06/06/2017 1110   PROTEINUR NEGATIVE 06/06/2017 1110   UROBILINOGEN 0.2 02/28/2012 2004   NITRITE NEGATIVE 06/06/2017 1110   LEUKOCYTESUR NEGATIVE 06/06/2017 1110   Sepsis Labs Invalid input(s): PROCALCITONIN,  WBC,  LACTICIDVEN Microbiology No results found for this or any previous visit (from the past 240 hour(s)).   Patient was seen and examined on the day of discharge and was found to be in stable condition. Time coordinating discharge: 25 minutes including assessment and coordination of care, as well as examination of the patient.   SIGNED:  [name], DO [facility] Pager [phone]  If 7PM-7AM, please contact night-coverage [email or web address] Password [facility] 08/06/2017, 2:03 PM

## 2017-08-12 IMAGING — CR DG CHEST 2V
2 series · 2 of 2 positions shown · non-contrast
Comparison: 02/25/2014

CLINICAL DATA: Cough and shortness of breath for 2 days.

EXAM:
CHEST  2 VIEW

[chest pa]
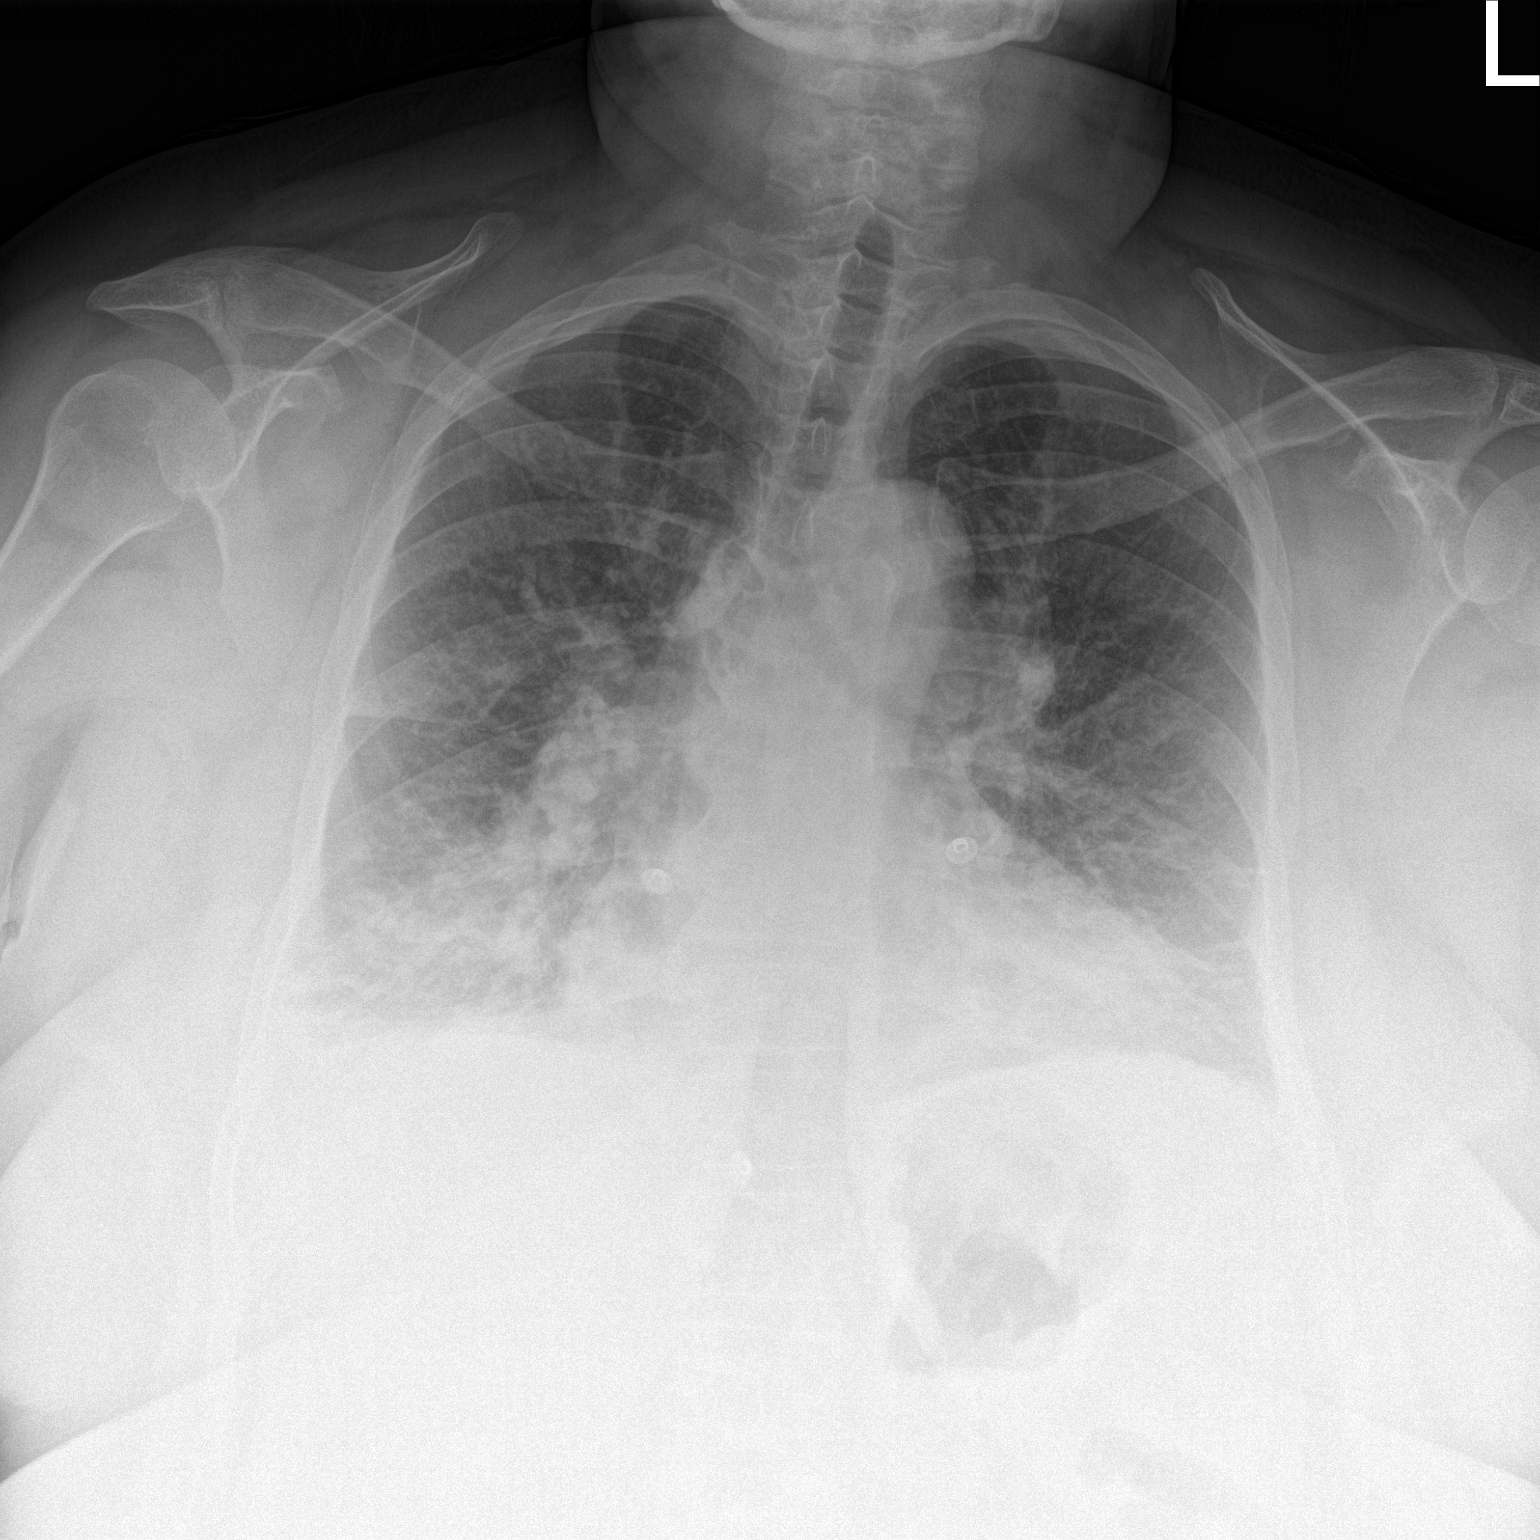

[chest lat]
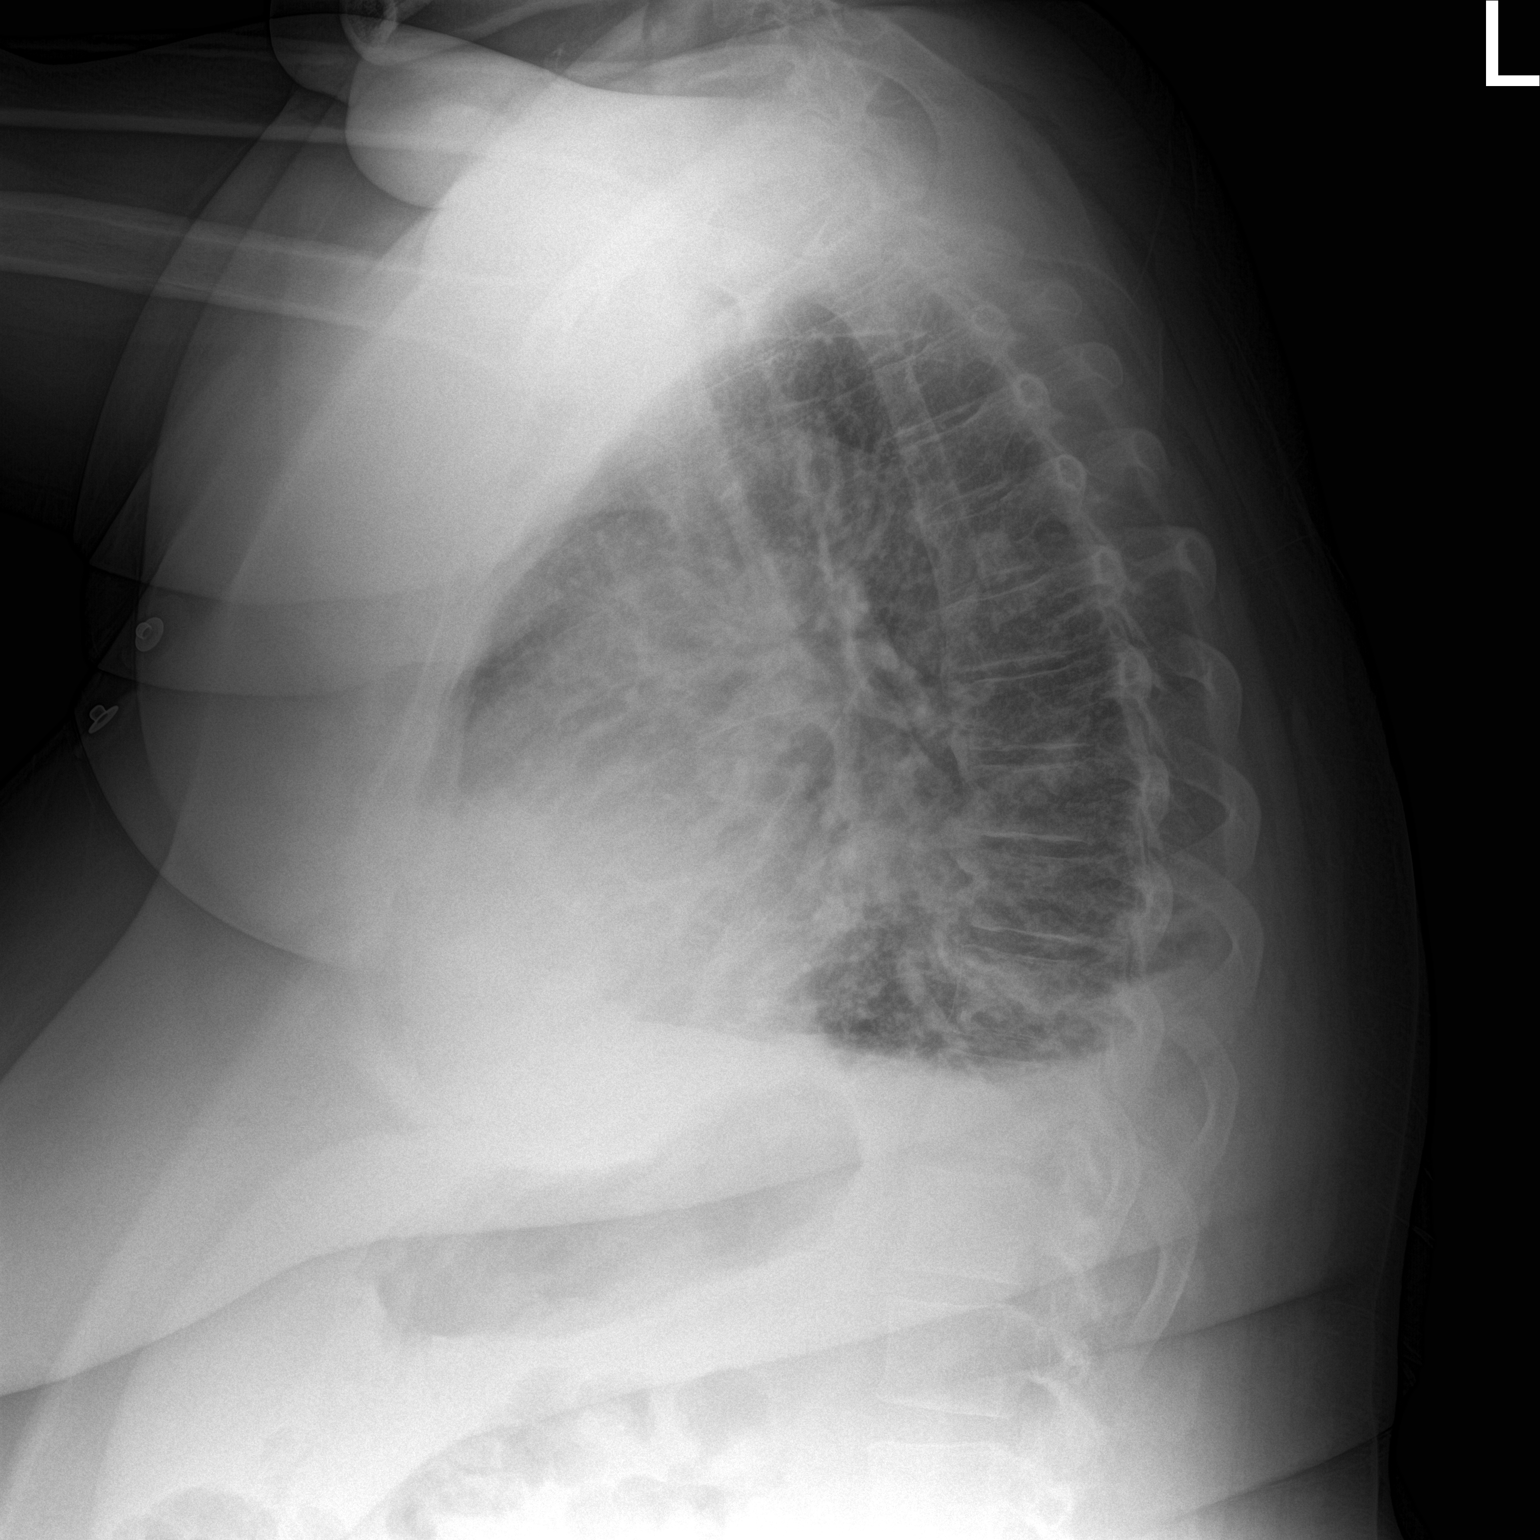

[2 of 2 positions shown; findings below may reference images not displayed]

FINDINGS: The heart is mildly enlarged. There is tortuosity and mild ectasia
of the thoracic aorta. There is vascular congestion, interstitial
edema and small bilateral pleural effusions along with overlying
atelectasis and scarring. The bony thorax is intact.
IMPRESSION: CHF with small effusions and areas of atelectasis and scarring.

## 2017-08-13 ENCOUNTER — Other Ambulatory Visit (HOSPITAL_COMMUNITY): Payer: Self-pay | Admitting: Cardiology

## 2017-08-14 ENCOUNTER — Encounter: Payer: Medicaid Other | Admitting: Podiatry

## 2017-08-16 ENCOUNTER — Ambulatory Visit (INDEPENDENT_AMBULATORY_CARE_PROVIDER_SITE_OTHER): Payer: Medicaid Other | Admitting: Podiatry

## 2017-08-16 ENCOUNTER — Encounter: Payer: Self-pay | Admitting: Podiatry

## 2017-08-16 DIAGNOSIS — M79676 Pain in unspecified toe(s): Secondary | ICD-10-CM | POA: Diagnosis not present

## 2017-08-16 DIAGNOSIS — L600 Ingrowing nail: Secondary | ICD-10-CM

## 2017-08-16 DIAGNOSIS — B351 Tinea unguium: Secondary | ICD-10-CM

## 2017-08-16 DIAGNOSIS — E1151 Type 2 diabetes mellitus with diabetic peripheral angiopathy without gangrene: Secondary | ICD-10-CM

## 2017-08-16 DIAGNOSIS — M722 Plantar fascial fibromatosis: Secondary | ICD-10-CM

## 2017-08-16 NOTE — Progress Notes (Signed)
She presents today after permanently excision of her hallux nail right her second digit right and her hallux nail left.  She states they seem to be doing well no problems.  Continues to soak them daily.  Her graph objective: Vital signs stable alert oriented x3 there is no erythema edema saline strange odor of the hallux nail bed left has completely healed however the right one does demonstrate some granulation tissue and some epithelialization occurring to the hallux right.  Second nail bed appears to be healing as well.  Assessment: Healed surgical foot.  Plan: Continue to soak at least once daily cover during the day leave open at bedtime.  We will follow-up with her in 2 weeks.

## 2017-08-28 ENCOUNTER — Telehealth: Payer: Self-pay | Admitting: Podiatry

## 2017-08-28 ENCOUNTER — Other Ambulatory Visit: Payer: Self-pay

## 2017-08-28 ENCOUNTER — Encounter: Payer: Self-pay | Admitting: Podiatry

## 2017-08-28 ENCOUNTER — Ambulatory Visit: Payer: Medicaid Other | Admitting: Podiatry

## 2017-08-28 DIAGNOSIS — I10 Essential (primary) hypertension: Secondary | ICD-10-CM | POA: Insufficient documentation

## 2017-08-28 DIAGNOSIS — M722 Plantar fascial fibromatosis: Secondary | ICD-10-CM

## 2017-08-28 DIAGNOSIS — E66813 Obesity, class 3: Secondary | ICD-10-CM | POA: Insufficient documentation

## 2017-08-28 DIAGNOSIS — E669 Obesity, unspecified: Secondary | ICD-10-CM | POA: Insufficient documentation

## 2017-08-28 NOTE — Telephone Encounter (Signed)
Michele Owens, this is [name]. I keep calling you all and you won't call me back. Now its got me worried. My number is [phone]. Please call me back! Why in the world they doing this to me?

## 2017-08-28 NOTE — Telephone Encounter (Signed)
Yes, this is Ms. Nicodemus. When y'all was beeping in, I was talking to my thyroid doctor. My number is [phone]. Please give me a call back. Thank you.

## 2017-08-28 NOTE — Telephone Encounter (Addendum)
This is Michele Owens, I have been trying and trying to reach someone there. Someone from your office called and at the top there was a text to call your office. I told the pt that I spoke to everyone in the office and nobody had called her. I said it was probably the automated system calling and it could be that they have a glitch. Pt finally understood and said she knows when her next appointment is because she got it when she checked out today.

## 2017-08-28 NOTE — Progress Notes (Signed)
She presents today chief complaint of pain to her right heel.  States my plantar fasciitis has come back.  She states that her hallux and second toe her total matrixectomy's were performed are still little weepy and draining and throbbing on occasions.  Objective: Vital signs are stable alert and oriented x3.  She has pain on palpation medial calcaneal tubercle of the right foot.  Pulses remain strong and palpable.  She has some fibrin deposition to the toes with granulation tissue beneath it appears that this is healing in slowly with skin and epithelialization occurring.  Assessment: Fasciitis right heel.  Resolving matrixectomy hallux right.  Plan: Discussed etiology pathology conservative versus surgical therapies.  At this point discontinue soaking Epsom salts and warm water once daily cover with a Band-Aid.  I also want her to leave that open at bedtime.  I injected her right heel today with 20 mg Kenalog 5 mg Marcaine to the point of maximal tenderness of the right heel.  Tolerated procedure well without complications.

## 2017-08-30 ENCOUNTER — Encounter: Payer: Medicaid Other | Admitting: Podiatry

## 2017-09-05 ENCOUNTER — Telehealth: Payer: Self-pay | Admitting: Podiatry

## 2017-09-05 NOTE — Telephone Encounter (Signed)
Pt states she has sharp pain in the toe that had surgery, and more redness, but is dry. Pt states she is elevating but is not getting any relief. Pt states her feet are swollen, and she is elevating put the toe is not swollen. Pt states she is soaking the toe in antibacterial soap and hasn't been using and antibiotic ointment. I offered pt an earlier appt than 10/01/2017 and she states she will have to have time to get transportation. I told pt if she could not come in I would have to get orders from Dr. March Rummage, then pt states she doesn't have any money for more medicine, but she uses a pharmacy on [address] and you have to ask for Christy Sartorius. Pt states go ahead and make an appt she'll get someone to bring her.

## 2017-09-05 NOTE — Telephone Encounter (Signed)
Pt is having a lot sharp pain through toes. She wanted to know what she needs to do. She is taking her pain medication as well. She needs to know what she needs to do. She has an appointment 10/01/2017.

## 2017-09-06 ENCOUNTER — Telehealth: Payer: Self-pay | Admitting: Podiatry

## 2017-09-06 NOTE — Telephone Encounter (Signed)
Pt called office and stated that she was still in some pain. I offered to schedule her with Dr. Milinda Pointer or even put her on the nurse schedule. She stated that she will continue to follow her soaking instructions because she is unable to come in early due to her having transportation issues.

## 2017-09-11 ENCOUNTER — Encounter: Payer: Self-pay | Admitting: Podiatry

## 2017-09-11 ENCOUNTER — Emergency Department (HOSPITAL_COMMUNITY)
Admission: EM | Admit: 2017-09-11 | Discharge: 2017-09-11 | Disposition: A | Discharge status: Home / Self Care | Payer: Medicaid Other | Attending: Emergency Medicine | Admitting: Emergency Medicine

## 2017-09-11 ENCOUNTER — Ambulatory Visit: Payer: Medicaid Other | Admitting: Podiatry

## 2017-09-11 ENCOUNTER — Emergency Department (HOSPITAL_COMMUNITY): Payer: Medicaid Other

## 2017-09-11 ENCOUNTER — Encounter (HOSPITAL_COMMUNITY): Payer: Self-pay | Admitting: Emergency Medicine

## 2017-09-11 DIAGNOSIS — E119 Type 2 diabetes mellitus without complications: Secondary | ICD-10-CM | POA: Diagnosis not present

## 2017-09-11 DIAGNOSIS — Z85850 Personal history of malignant neoplasm of thyroid: Secondary | ICD-10-CM | POA: Diagnosis not present

## 2017-09-11 DIAGNOSIS — L089 Local infection of the skin and subcutaneous tissue, unspecified: Secondary | ICD-10-CM | POA: Insufficient documentation

## 2017-09-11 DIAGNOSIS — R0600 Dyspnea, unspecified: Secondary | ICD-10-CM | POA: Diagnosis not present

## 2017-09-11 DIAGNOSIS — M7989 Other specified soft tissue disorders: Secondary | ICD-10-CM

## 2017-09-11 DIAGNOSIS — Z79899 Other long term (current) drug therapy: Secondary | ICD-10-CM | POA: Insufficient documentation

## 2017-09-11 DIAGNOSIS — Z7984 Long term (current) use of oral hypoglycemic drugs: Secondary | ICD-10-CM | POA: Insufficient documentation

## 2017-09-11 DIAGNOSIS — L03031 Cellulitis of right toe: Secondary | ICD-10-CM | POA: Diagnosis not present

## 2017-09-11 DIAGNOSIS — I110 Hypertensive heart disease with heart failure: Secondary | ICD-10-CM | POA: Insufficient documentation

## 2017-09-11 DIAGNOSIS — I5032 Chronic diastolic (congestive) heart failure: Secondary | ICD-10-CM | POA: Diagnosis not present

## 2017-09-11 DIAGNOSIS — R609 Edema, unspecified: Secondary | ICD-10-CM | POA: Diagnosis not present

## 2017-09-11 DIAGNOSIS — Z87891 Personal history of nicotine dependence: Secondary | ICD-10-CM | POA: Insufficient documentation

## 2017-09-11 DIAGNOSIS — J45909 Unspecified asthma, uncomplicated: Secondary | ICD-10-CM | POA: Diagnosis not present

## 2017-09-11 LAB — CBC
HCT: 38.7 % (ref 36.0–46.0)
HEMOGLOBIN: 11.8 g/dL — AB (ref 12.0–15.0)
MCH: 28.4 pg (ref 26.0–34.0)
MCHC: 30.5 g/dL (ref 30.0–36.0)
MCV: 93.3 fL (ref 78.0–100.0)
Platelets: 329 10*3/uL (ref 150–400)
RBC: 4.15 MIL/uL (ref 3.87–5.11)
RDW: 13.3 % (ref 11.5–15.5)
WBC: 15.4 10*3/uL — ABNORMAL HIGH (ref 4.0–10.5)

## 2017-09-11 LAB — I-STAT TROPONIN, ED: TROPONIN I, POC: 0 ng/mL (ref 0.00–0.08)

## 2017-09-11 LAB — BASIC METABOLIC PANEL
Anion gap: 9 (ref 5–15)
BUN: 9 mg/dL (ref 6–20)
CALCIUM: 8.4 mg/dL — AB (ref 8.9–10.3)
CO2: 32 mmol/L (ref 22–32)
CREATININE: 0.5 mg/dL (ref 0.44–1.00)
Chloride: 101 mmol/L (ref 101–111)
GFR calc Af Amer: >60 mL/min (ref >60)
GLUCOSE: 104 mg/dL — AB (ref 65–99)
Potassium: 4.1 mmol/L (ref 3.5–5.1)
Sodium: 142 mmol/L (ref 135–145)

## 2017-09-11 LAB — BRAIN NATRIURETIC PEPTIDE: B NATRIURETIC PEPTIDE 5: 30.3 pg/mL (ref 0.0–100.0)

## 2017-09-11 LAB — I-STAT BETA HCG BLOOD, ED (MC, WL, AP ONLY): I-stat hCG, quantitative: <5 m[IU]/mL (ref <5)

## 2017-09-11 MED ORDER — FUROSEMIDE 20 MG PO TABS
80.0000 mg | ORAL_TABLET | Freq: Once | ORAL | Status: AC
Start: 2017-09-11 — End: 2017-09-11
  Administered 2017-09-11: 80 mg via ORAL
  Filled 2017-09-11: qty 4

## 2017-09-11 MED ORDER — IPRATROPIUM-ALBUTEROL 0.5-2.5 (3) MG/3ML IN SOLN
3.0000 mL | Freq: Once | RESPIRATORY_TRACT | Status: AC
  Administered 2017-09-11: 3 mL via RESPIRATORY_TRACT
  Filled 2017-09-11: qty 3

## 2017-09-11 MED ORDER — DOXYCYCLINE HYCLATE 100 MG PO TABS: 100.0000 mg | ORAL_TABLET | Freq: Two times a day (BID) | ORAL | 0 refills | Status: DC

## 2017-09-11 MED ORDER — CLONAZEPAM 0.5 MG PO TABS
2.0000 mg | ORAL_TABLET | Freq: Once | ORAL | Status: AC
  Administered 2017-09-11: 2 mg via ORAL
  Filled 2017-09-11: qty 4

## 2017-09-11 MED ORDER — METFORMIN HCL 500 MG PO TABS
500.0000 mg | ORAL_TABLET | Freq: Once | ORAL | Status: AC
  Administered 2017-09-11: 500 mg via ORAL
  Filled 2017-09-11: qty 1

## 2017-09-11 MED ORDER — HYDROCODONE-ACETAMINOPHEN 5-325 MG PO TABS
1.0000 | ORAL_TABLET | Freq: Once | ORAL | Status: AC
Start: 2017-09-11 — End: 2017-09-11
  Administered 2017-09-11: 1 via ORAL
  Filled 2017-09-11: qty 1

## 2017-09-11 NOTE — ED Provider Notes (Signed)
[facility] EMERGENCY DEPARTMENT Provider Note   CSN: [id number] Arrival date & time: 09/11/17  1129     History   Chief Complaint Chief Complaint  Patient presents with  . Leg Swelling    HPI Michele Owens is a 57 y.o. female who presents with bilateral leg swelling. PMH significant for CHF, COPD on O2 prn, DM, GERD, hx of DVT. She states that she had surgery on her toes approximately 1 month ago. Since then she's had gradually worsening pain in the L great toe and 2nd toe. She's also had bilateral leg swelling that's been worsening over the past week. She has become more SOB today. She went to see her podiatrist today and was given an rx for Doxy and was told to come to the ED to "get the fluid off". She typically wears O2 as needed but has had to use it more consistently because of the SOB. She also reports chills, and cough productive of whitish sputum, wheezing, and chest pain. The chest pain is intermittent and is sometimes left sided sometimes right sided. It feels like "bowels moving in my chest". She's also had some bilateral leg cramping and has been taking her potassium. She denies fever, abdominal pain, N/V/D, urinary symptoms. She has been taking Lasix as prescribed. She reports weight gain of about 6 pounds since last week. She was admitted for chest pain obs in April and her pain improved after diuresis and tx for COPD exacerbation. Echo in April shows EF 60-65%. She states that she is worried that the fluid will "go to my heart and kill me".  HPI  Past Medical History:  Diagnosis Date  . Anxiety   . Arthritis   . Asthma   . Chronic diastolic CHF (congestive heart failure) ([place])   . COPD (chronic obstructive pulmonary disease) (HCC)    Uses Oxygen at night  . Depression   . Diabetes mellitus without complication ([place])   . Dyspnea    occ  . Family history of thyroid cancer   . GERD (gastroesophageal reflux disease)   . Gout   . Headache    migraines   . History of DVT of lower extremity   . History of nuclear stress test    Myoview 2/17:  Low risk stress nuclear study with a small, moderate intensity, partially reversible inferior lateral defect consistent with small prior infarct and minimal peri-infarct ischemia; EF 68 with normal wall motion  . Hypertension   . OSA (obstructive sleep apnea)   . Pneumonia   . Thyroid cancer ([place]) 09/23/2016    Patient Active Problem List   Diagnosis Date Noted  . Hypertensive disorder 08/28/2017  . Obesity 08/28/2017  . COPD exacerbation ([place]) 08/04/2017  . Acute and chronic respiratory failure with hypercapnia ([place])   . Acute on chronic diastolic heart failure ([place])   . Pulmonary HTN ([place])   . Genetic testing 02/27/2017  . Family history of thyroid cancer   . Hypomagnesemia 10/03/2016  . Boil, breast 10/03/2016  . Atypical chest pain   . HLD (hyperlipidemia) 09/23/2016  . COPD (chronic obstructive pulmonary disease) ([place]) 09/23/2016  . Gout 09/23/2016  . DVT (deep vein thrombosis) in pregnancy ([place]) 09/23/2016  . Thyroid cancer ([place]) 09/23/2016  . Hypocalcemia 09/23/2016  . AKI (acute kidney injury) ([place]) 09/23/2016  . 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 Acute pulmonary edema (HCC)   . Acute hypoxemic respiratory failure ([place])   . Acute on chronic  respiratory failure with hypoxemia ([place])   . Ventilator dependent ([place])   . Respiratory failure ([place]) 07/16/2016  . Chronic combined systolic (congestive) and diastolic (congestive) heart failure ([place]) 02/10/2016  . Dyspnea   . CAP (community acquired pneumonia) 01/23/2016  . 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 Pulmonary hypertension ([employer]) 01/23/2016  . Sepsis ([place]) 01/23/2016  . Obesity hypoventilation syndrome ([place]) 08/26/2015  . Chest pain 06/16/2015  . Dyslipidemia associated with type 2 diabetes mellitus ([place]) 04/24/2015  . Leukocytosis 04/24/2015  . Controlled type 2 diabetes  mellitus without complication, without long-term current use of insulin ([place]) 04/24/2015  . Anxiety 04/24/2015  . Benign essential HTN 04/24/2015  . Normocytic anemia 04/24/2015  . Asthma with COPD with exacerbation ([place]) 04/24/2015  . OSA (obstructive sleep apnea) 05/10/2012  . Tobacco abuse 07/04/2011    Past Surgical History:  Procedure Laterality Date  . BUNIONECTOMY Bilateral   . CARDIAC CATHETERIZATION N/A 06/23/2015   Procedure: Right/Left Heart Cath and Coronary Angiography;  Surgeon: [name], MD;  Location: [facility];  Service: Cardiovascular;  Laterality: N/A;  . COLONOSCOPY WITH PROPOFOL N/A 12/15/2015   Procedure: COLONOSCOPY WITH PROPOFOL;  Surgeon: [name], MD;  Location: [facility];  Service: Endoscopy;  Laterality: N/A;  . THYROIDECTOMY  09/19/2016  . THYROIDECTOMY N/A 09/19/2016   Procedure: TOTAL THYROIDECTOMY;  Surgeon: [name], MD;  Location: [facility];  Service: General;  Laterality: N/A;  . TONSILLECTOMY    . TOTAL ABDOMINAL HYSTERECTOMY  07/14/10     OB History   None      Home Medications    Prior to Admission medications   Medication Sig Start Date End Date Taking? Authorizing Provider  ACCU-CHEK SOFTCLIX LANCETS lancets USE TO CHECK BLOOD SUGAR 4 TIMES A DAY 08/07/17   [provider]  albuterol (ACCUNEB) 1.25 MG/3ML nebulizer solution Take 1 ampule by nebulization 2 (two) times daily.    [provider]  albuterol (PROVENTIL HFA;VENTOLIN HFA) 108 (90 BASE) MCG/ACT inhaler Inhale 1-2 puffs into the lungs every 6 (six) hours as needed for wheezing. Patient taking differently: Inhale 1 puff into the lungs 2 (two) times daily.  04/09/12   Billy Fischer, MD  allopurinol (ZYLOPRIM) 100 MG tablet Take 100 mg by mouth daily.    [provider]  Celedonio Miyamoto 62.5-25 MCG/INH AEPB USE ONE PUFF BY MOUTH DAILY 08/20/17   [provider]  aspirin-acetaminophen-caffeine (EXCEDRIN MIGRAINE) [phone] MG tablet  Take 2 tablets by mouth daily as needed for headache.    [provider]  Blood Glucose Monitoring Suppl (ACCU-CHEK AVIVA PLUS) w/Device KIT USE AS DIRECTED TO CHECK BLOOD SUGAR 08/07/17   [provider]  budesonide-formoterol (SYMBICORT) 80-4.5 MCG/ACT inhaler Inhale 2 puffs into the lungs 2 (two) times daily. 07/24/16   Lavina Hamman, MD  calcitRIOL (ROCALTROL) 0.5 MCG capsule Take 1 capsule (0.5 mcg total) by mouth daily. 09/26/16   Hongalgi, [name], MD  carvedilol (COREG) 6.25 MG tablet Take 6.25 mg by mouth 2 (two) times daily with a meal. 08/13/17   [provider]  cetirizine (ZYRTEC) 10 MG tablet Take 10 mg by mouth daily. 07/10/17   [provider]  clonazePAM (KLONOPIN) 1 MG tablet Take 1 mg by mouth 3 (three) times daily as needed. for anxiety 06/06/17   [provider]  clonazePAM (KLONOPIN) 2 MG tablet Take 1 mg by mouth 3 (three) times daily. for anxiety 05/08/17   [provider]  diclofenac sodium (VOLTAREN) 1 % GEL Apply 2 g topically 4 (four) times daily as needed (for pain).     [provider]  dicyclomine (BENTYL) 20 MG tablet Take 1 tablet (20 mg total) by mouth 2 (two) times daily. 06/06/17   Hayden Rasmussen, MD  doxycycline (VIBRA-TABS) 100 MG tablet Take 1 tablet (100 mg total) by mouth 2 (two) times daily. 09/11/17   Hyatt, Max T, DPM  EPINEPHrine 0.3 mg/0.3 mL IJ SOAJ injection Inject 0.3 mg into the muscle daily as needed (allergic reaction).    [provider]  FEROSUL 325 (65 Fe) MG tablet Take 325 mg by mouth daily. 03/22/17   [provider]  furosemide (LASIX) 40 MG tablet Take 1 tablet (40 mg total) by mouth 2 (two) times daily. 08/06/17   [name], DO  glucose blood test strip Use as instructed to check blood sugar up to 4 times daily 08/06/17   [name], DO  guaiFENesin-dextromethorphan (ROBITUSSIN DM) 100-10 MG/5ML syrup Take 5 mLs by mouth every 4 (four) hours as needed for cough.  08/06/17   [name], DO  HYDROcodone-acetaminophen (NORCO) 10-325 MG tablet Take 1 tablet by mouth every 6 (six) hours as needed. For up to 7 days.    [provider]  ipratropium-albuterol (DUONEB) 0.5-2.5 (3) MG/3ML SOLN Use one vial in Nebulizer four times daily as directed. 04/05/17   [provider]  levothyroxine (SYNTHROID, LEVOTHROID) 112 MCG tablet Take 224 mcg by mouth daily. 07/11/17   [provider]  lisinopril (PRINIVIL,ZESTRIL) 5 MG tablet Take 1 tablet (5 mg total) by mouth daily. Please call for office visit [phone] 08/15/17   [name], MD  metFORMIN (GLUCOPHAGE) 500 MG tablet Take 1 tablet (500 mg total) by mouth 2 (two) times daily with a meal. 12/11/14   [name], MD  metoprolol succinate (TOPROL-XL) 25 MG 24 hr tablet Take 1 tablet (25 mg total) by mouth daily. 08/07/17   [name], DO  nicotine (NICODERM CQ - DOSED IN MG/24 HOURS) 14 mg/24hr patch Place 1 patch (14 mg total) onto the skin daily. Patient taking differently: Place 14 mg onto the skin daily. For 4 weeks 07/25/16   Lavina Hamman, MD  nystatin cream (MYCOSTATIN) APPLY TO THE AFFECTED AREA(S) TWICE DAILY AS NEEDED 04/13/17   [provider]  omeprazole (PRILOSEC) 40 MG capsule Take 40 mg by mouth daily. 04/13/15   [provider]  potassium chloride SA (K-DUR,KLOR-CON) 20 MEQ tablet Take 2 tablets (40 mEq total) by mouth daily. Patient taking differently: Take 20 mEq by mouth daily.  08/01/16   [name], MD  pravastatin (PRAVACHOL) 40 MG tablet Take 40 mg by mouth every evening.  03/29/15   [provider]  predniSONE (DELTASONE) 10 MG tablet Take 4 tabs for 3 days, then 3 tabs for 3 days, then 2 tabs for 3 days, then 1 tab for 3 days, then 1/2 tab for 4 days. 08/06/17   [name], DO  promethazine (PHENERGAN) 25 MG tablet Take 1 tablet (25 mg total) by mouth every 8 (eight) hours as needed. 07/24/17   Hyatt, Max T, DPM  QC STOOL  SOFTENER PLS LAXATIVE 8.6-50 MG tablet Take 1 tablet by mouth at bedtime. 04/11/17   [provider]  tiotropium (SPIRIVA) 18 MCG inhalation capsule Place 18 mcg into inhaler and inhale daily.    [provider]    Family History Family History  Problem  Relation Age of Onset  . Cancer Father        thought to be due to exposure to concrete  . Diabetes Mother   . Thyroid cancer Mother        dx in her 5s-60s  . Cancer Maternal Uncle        2 uncles with cancer NOS  . Brain cancer Paternal Aunt   . Cancer Cousin        maternal first cousin - NOS  . Cancer Cousin        maternal first cousin - NOS    Social History Social History   Tobacco Use  . Smoking status: Former Smoker    Packs/day: 0.50    Years: 41.00    Pack years: 20.50    Types: Cigarettes    Last attempt to quit: 07/2016    Years since quitting: 1.1  . Smokeless tobacco: Never Used  . Tobacco comment: Starting to Wean Off -- using Nicotine Patch  Substance Use Topics  . Alcohol use: Yes    Alcohol/week: 0.0 oz    Comment: ? none now  . Drug use: Yes    Types: Marijuana    Comment: none for long time     Allergies   Bee venom; Ibuprofen; Lamisil [terbinafine]; and Nsaids   Review of Systems Review of Systems  Constitutional: Positive for chills. Negative for fever.  Respiratory: Positive for cough, shortness of breath and wheezing.   Cardiovascular: Positive for chest pain and leg swelling. Negative for palpitations.  Gastrointestinal: Negative for abdominal pain, diarrhea, nausea and vomiting.  Genitourinary: Negative for dysuria.  Musculoskeletal: Positive for arthralgias.  Skin: Positive for wound.  All other systems reviewed and are negative.    Physical Exam Updated Vital Signs BP 114/70 (BP Location: Left Arm)   Pulse 78   Temp 98.2 F (36.8 C) (Oral)   Resp (!) 22   SpO2 96%   Physical Exam  Constitutional: She is oriented to person, place, and time. She appears  well-developed and well-nourished. No distress.  Tearful and anxious. On 2L O2 via [place]  HENT:  Head: Normocephalic and atraumatic.  Eyes: Pupils are equal, round, and reactive to light. Conjunctivae are normal. Right eye exhibits no discharge. Left eye exhibits no discharge. No scleral icterus.  Neck: Normal range of motion.  Cardiovascular: Normal rate and regular rhythm. Exam reveals distant heart sounds.  Pulmonary/Chest: Effort normal and breath sounds normal. No respiratory distress.  Abdominal: Soft. Bowel sounds are normal. She exhibits no distension. There is no tenderness.  Musculoskeletal:  2+ non-pitting edema to the bilateral lower extremities. 2+ DP pulses bilaterally. No redness noted. Right great toe and 2nd toe have nails removed and have a macerated, possibly infected appearance  Neurological: She is alert and oriented to person, place, and time.  Skin: Skin is warm and dry.  Psychiatric: She has a normal mood and affect. Her behavior is normal.  Nursing note and vitals reviewed.    ED Treatments / Results  Labs (all labs ordered are listed, but only abnormal results are displayed) Labs Reviewed  BASIC METABOLIC PANEL - Abnormal; Notable for the following components:      Result Value   Glucose, Bld 104 (*)    Calcium 8.4 (*)    All other components within normal limits  CBC - Abnormal; Notable for the following components:   WBC 15.4 (*)    Hemoglobin 11.8 (*)    All other components within  normal limits  BRAIN NATRIURETIC PEPTIDE  I-STAT TROPONIN, ED  I-STAT BETA HCG BLOOD, ED ([facility], [facility], [facility] ONLY)    EKG None  Radiology Dg Chest 2 View  Result Date: 09/11/2017 CLINICAL DATA:  Shortness of breath and bilateral lower extremity swelling. EXAM: CHEST - 2 VIEW COMPARISON:  PA and lateral chest 08/04/2017, 04/17/2017 and 01/23/2016. FINDINGS: There is cardiomegaly without edema. Bilateral linear opacities consistent with atelectasis or scar are identified. No acute  bony abnormality. IMPRESSION: Cardiomegaly without acute disease. Electronically Signed   By: Inge Rise M.D.   On: 09/11/2017 12:15    Procedures Procedures (including critical care time)  Medications Ordered in ED Medications  ipratropium-albuterol (DUONEB) 0.5-2.5 (3) MG/3ML nebulizer solution 3 mL (3 mLs Nebulization Given 09/11/17 1630)  furosemide (LASIX) tablet 80 mg (80 mg Oral Given 09/11/17 1629)  HYDROcodone-acetaminophen (NORCO/VICODIN) 5-325 MG per tablet 1 tablet (1 tablet Oral Given 09/11/17 1628)  clonazePAM (KLONOPIN) tablet 2 mg (2 mg Oral Given 09/11/17 1810)  metFORMIN (GLUCOPHAGE) tablet 500 mg (500 mg Oral Given 09/11/17 1810)     Initial Impression / Assessment and Plan / ED Course  I have reviewed the triage vital signs and the nursing notes.  Pertinent labs & imaging results that were available during my care of the patient were reviewed by me and considered in my medical decision making (see chart for details).  57 year old female presents with SOB and leg swelling. Her vitals are normal here although she has been continuously on 2L of O2. Typically she only requires O2 prn. Her heart is normal rate and rhythm. Lungs are CTA. She does have quite a bit of peripheral edema which she reports as new. Suspect she may be volume overloaded despite her taking her lasix as directed. She does have a hx of DVT which I think is less likely since swelling is bilateral. She also has a local infection over the right toes. CBC is remarkable for leukocytosis. It appears she has a chronic leukocytosis which is not much worse than normal. BMP is normal. EKG is NSR. CXR is negative for pulmonary edema. Trop is normal and her description of her chest pain is very atypical sounding.   Plan is to add on a BNP and give an extra dose of Lasix as well as a breathing tx. We will attempt to get her feeling better and try to discharge.   Of note, nursing staff states that the patient has been very  demanding and needy. She is frequently calling out, asking for doses of home meds, unhappy with the food that has been provided for her, and needing a lot of attention.   7:49 PM RN came to me and said she is unhappy because she wanted IV Lasix because "she knows her body" and states that IV works better for her. Per nursing she has had ~900cc of UO since receiving Lasix. BNP is still pending. I advised her that she has been having good UO with the PO Lasix and that it will take time for the swelling to go down. I also want her to walk in the hall to check her sats.   BNP is normal. Her sats were 91-94% with O2. Her pastor came to visit her and she is in much better spirits on re-check. Advised her to start Doxy that was prescribed by her podiatrist and to follow up with her doctor. She was also advised to continue her fluid medicine and to continue her home O2.  She states she has an appt on Thursday with Dr. Jonelle Sidle.  Final Clinical Impressions(s) / ED Diagnoses   Final diagnoses:  Leg swelling  Toe infection  Dyspnea, unspecified type    ED Discharge Orders    None       Recardo Evangelist, PA-C 09/11/17 2259    Margette Fast, MD 09/12/17 [phone]

## 2017-09-11 NOTE — ED Notes (Signed)
Pt called RN into the room complaining that we are not doing nothing. RN tried to see what was going on with the patient. She was upset because of the food that was gotten for her, was upset because she had requested a ginger ale and it hadn't arrived yet. Charge nurse was notified and came to the bedside to speak with the patient. Pt also wanted to speak with the provider. Provider was notified that she wanted to speak with them.

## 2017-09-11 NOTE — ED Notes (Signed)
Pt ambulated with 2L O2 and sat was 91-94%. Prior ambulated without O2 and 88-90%

## 2017-09-11 NOTE — Discharge Instructions (Signed)
Please take Doxycycline as directed for toe infection Continue Lasix and follow fluid restrictions Please follow up with your doctor Return if worsening

## 2017-09-11 NOTE — ED Notes (Signed)
Pt understood dc material. NAD noted. 

## 2017-09-11 NOTE — ED Notes (Signed)
Assisted patient to bedside commode

## 2017-09-11 NOTE — ED Notes (Signed)
Charge RN called to bedside as patient is unhappy with medications she has received so far - pt reports that she wants "lasix in that thing in my arm" because the PO dose "hasn't done anything." States "I know my body and that lasix don't do nothing." Provider made aware of requests, notified patient that this type of order is up to the provider. Pt states she will not leave hospital unless she is given an IV.

## 2017-09-11 NOTE — Progress Notes (Signed)
She presents today for follow-up of her nail avulsion matrixectomy hallux right.  Objective: Vital signs are stable she is alert and oriented x3.  Pulses are palpable.  Continue to soak twice a day.  Objective: Mild erythema mild fibrin deposition.  No granulation no purulence no malodor.  Second toe appears to be healing hallux has not healed completely.  Assessment: Paronychia status post matrixectomy hallux right.  Plan: Start her on doxycycline covered in the day leave open at bedtime follow-up with me in 1 month

## 2017-09-11 NOTE — ED Triage Notes (Signed)
Pt arrives to ED with bilateral leg swelling on home o2 that she where's as needed, pt states she has been having to where this at all times.

## 2017-09-12 ENCOUNTER — Telehealth: Payer: Self-pay | Admitting: *Deleted

## 2017-09-12 NOTE — Telephone Encounter (Signed)
I informed pt of Dr. Stephenie Acres 09/11/2017 instructions and reminded her of 09/27/2017 appt with Dr. Milinda Pointer.

## 2017-09-12 NOTE — Telephone Encounter (Signed)
Unable to leave a message informing pt of Dr. Stephenie Acres instructions from 09/11/2017, pt's voicemail says call me again, and a song plays for over a minute.

## 2017-09-12 NOTE — Telephone Encounter (Signed)
Pt states she took a shower and her toe is still draining, should she keep it covered.

## 2017-09-18 ENCOUNTER — Telehealth: Payer: Self-pay | Admitting: Podiatry

## 2017-09-18 NOTE — Telephone Encounter (Signed)
Pt called and wanted to know if she can wear sneakers or sandals. Also she stated that she wear support stockings and wanted to know does she keep those on. She is currently on medication and wanted to know if she needs to keep taking it.

## 2017-09-18 NOTE — Telephone Encounter (Signed)
I informed pt she could begin to wear her sandals and compression hose if they appropriate, cover with plain bandaid for comfort and continue the antibiotic to completion. Pt asked for her appt, and I informed 09/27/2017 10:45am.

## 2017-09-27 ENCOUNTER — Encounter: Payer: Self-pay | Admitting: Podiatry

## 2017-09-27 ENCOUNTER — Ambulatory Visit: Payer: Medicaid Other | Admitting: Podiatry

## 2017-09-27 DIAGNOSIS — M722 Plantar fascial fibromatosis: Secondary | ICD-10-CM | POA: Diagnosis not present

## 2017-09-27 DIAGNOSIS — B351 Tinea unguium: Secondary | ICD-10-CM | POA: Diagnosis not present

## 2017-09-27 DIAGNOSIS — M79676 Pain in unspecified toe(s): Secondary | ICD-10-CM | POA: Diagnosis not present

## 2017-09-27 DIAGNOSIS — E1151 Type 2 diabetes mellitus with diabetic peripheral angiopathy without gangrene: Secondary | ICD-10-CM

## 2017-09-27 NOTE — Progress Notes (Signed)
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  She is also complaining of bilateral heel pain.  Objective: Vital signs are stable she is alert and oriented x3 surgical sites of the toes are healing fine she does have some edema to the bilateral lower extremity left greater than right.  She has pain on palpation medial calcaneal tubercles bilateral.  Assessment: Edema left greater than right plantar fasciitis bilateral.  Plan: After sterile Betadine skin prep I injected 20 mg Kenalog 5 mg Marcaine to the medial aspect of the bilateral heel today.  I will follow-up with her in 2 months

## 2017-10-01 ENCOUNTER — Other Ambulatory Visit: Payer: Self-pay

## 2017-10-01 ENCOUNTER — Emergency Department (HOSPITAL_COMMUNITY): Payer: Medicaid Other

## 2017-10-01 ENCOUNTER — Encounter (HOSPITAL_COMMUNITY): Payer: Self-pay | Admitting: Emergency Medicine

## 2017-10-01 ENCOUNTER — Emergency Department (HOSPITAL_COMMUNITY)
Admission: EM | Admit: 2017-10-01 | Discharge: 2017-10-01 | Disposition: A | Discharge status: Home / Self Care | Payer: Medicaid Other | Attending: Physician Assistant | Admitting: Physician Assistant

## 2017-10-01 DIAGNOSIS — E119 Type 2 diabetes mellitus without complications: Secondary | ICD-10-CM | POA: Diagnosis not present

## 2017-10-01 DIAGNOSIS — J449 Chronic obstructive pulmonary disease, unspecified: Secondary | ICD-10-CM | POA: Insufficient documentation

## 2017-10-01 DIAGNOSIS — Z79899 Other long term (current) drug therapy: Secondary | ICD-10-CM | POA: Insufficient documentation

## 2017-10-01 DIAGNOSIS — R2243 Localized swelling, mass and lump, lower limb, bilateral: Secondary | ICD-10-CM | POA: Diagnosis not present

## 2017-10-01 DIAGNOSIS — M7989 Other specified soft tissue disorders: Secondary | ICD-10-CM

## 2017-10-01 DIAGNOSIS — R0602 Shortness of breath: Secondary | ICD-10-CM

## 2017-10-01 DIAGNOSIS — Z87891 Personal history of nicotine dependence: Secondary | ICD-10-CM | POA: Diagnosis not present

## 2017-10-01 DIAGNOSIS — I5042 Chronic combined systolic (congestive) and diastolic (congestive) heart failure: Secondary | ICD-10-CM | POA: Diagnosis not present

## 2017-10-01 DIAGNOSIS — I110 Hypertensive heart disease with heart failure: Secondary | ICD-10-CM | POA: Insufficient documentation

## 2017-10-01 LAB — BASIC METABOLIC PANEL
ANION GAP: 9 (ref 5–15)
BUN: 7 mg/dL (ref 6–20)
CHLORIDE: 99 mmol/L — AB (ref 101–111)
CO2: 32 mmol/L (ref 22–32)
Calcium: 8.4 mg/dL — ABNORMAL LOW (ref 8.9–10.3)
Creatinine, Ser: 0.55 mg/dL (ref 0.44–1.00)
GFR calc Af Amer: >60 mL/min (ref >60)
GLUCOSE: 87 mg/dL (ref 65–99)
POTASSIUM: 3.6 mmol/L (ref 3.5–5.1)
Sodium: 140 mmol/L (ref 135–145)

## 2017-10-01 LAB — I-STAT TROPONIN, ED: Troponin i, poc: 0.01 ng/mL (ref 0.00–0.08)

## 2017-10-01 LAB — D-DIMER, QUANTITATIVE (NOT AT ARMC): D DIMER QUANT: 0.38 ug{FEU}/mL (ref 0.00–0.50)

## 2017-10-01 LAB — CBC
HEMATOCRIT: 39.7 % (ref 36.0–46.0)
HEMOGLOBIN: 11.9 g/dL — AB (ref 12.0–15.0)
MCH: 27.7 pg (ref 26.0–34.0)
MCHC: 30 g/dL (ref 30.0–36.0)
MCV: 92.3 fL (ref 78.0–100.0)
Platelets: 342 10*3/uL (ref 150–400)
RBC: 4.3 MIL/uL (ref 3.87–5.11)
RDW: 13.1 % (ref 11.5–15.5)
WBC: 17.7 10*3/uL — ABNORMAL HIGH (ref 4.0–10.5)

## 2017-10-01 LAB — BRAIN NATRIURETIC PEPTIDE: B NATRIURETIC PEPTIDE 5: 70.7 pg/mL (ref 0.0–100.0)

## 2017-10-01 LAB — I-STAT BETA HCG BLOOD, ED (MC, WL, AP ONLY)

## 2017-10-01 MED ORDER — MORPHINE SULFATE (PF) 4 MG/ML IV SOLN
4.0000 mg | Freq: Once | INTRAVENOUS | Status: AC
Start: 2017-10-01 — End: 2017-10-01
  Administered 2017-10-01: 4 mg via INTRAVENOUS
  Filled 2017-10-01: qty 1

## 2017-10-01 MED ORDER — IPRATROPIUM-ALBUTEROL 0.5-2.5 (3) MG/3ML IN SOLN
3.0000 mL | Freq: Once | RESPIRATORY_TRACT | Status: AC
  Administered 2017-10-01: 3 mL via RESPIRATORY_TRACT
  Filled 2017-10-01: qty 3

## 2017-10-01 NOTE — ED Triage Notes (Signed)
Pt complains of bilateral lower leg swelling, SOB, intermittent CP. Pt wears 2L [place] at home. Pt tearful about how bad she has been feeling.

## 2017-10-01 NOTE — ED Provider Notes (Signed)
[facility] EMERGENCY DEPARTMENT Provider Note   CSN: [id number] Arrival date & time: 10/01/17  1049     History   Chief Complaint Chief Complaint  Patient presents with  . Shortness of Breath  . Leg Swelling    HPI Michele Owens is a 57 y.o. female with a past medical history of diastolic CHF (last echocardiogram 07/2017 with a EF of 60-65%), COPD on 2 L O2 chronically, diabetes, history of DVT, hypertension, remote history of thyroid cancer status post removal, asthma who presents emergency department today for shortness of breath as well as bilateral lower leg swelling.  Patient states that she has had ongoing shortness of breath over the last 1 month.  She reports that that since she was last seen on 6/4 she is no better or worse.  She notes that her shortness of breath occurs at rest but also with exertion.  She notes she is not able to lie down at night as it makes her shortness of breath to severe.  She notes she sleeps sitting up.  She reports she has a had a productive cough with whitish/brown sputum.  She reports she has had wheezing and is not controlled with home albuterol inhaler.  She reports she has had intermittent nonspecific chest pain for she reports her chest pain is typically on the left and sometimes goes to the right.  It does not occur with exertion.  It occurs at rest.  Pain does not last longer than 1-2 minutes.  Is not associate with nausea, diaphoresis or emesis.  She reports the pain is not positional or pleuritic in nature.  She reports that she has had bilateral lower leg swelling and cramping that is not relieved with her Lasix.  She states she has been taking her home Lasix as prescribed including 40 mg twice daily.  She is unsure if she has gained weight in the last several weeks.  Patient does report that she had foot surgery approximately 1 month ago.  She notes she was put to sleep for this.  She has been following up with her podiatrist and  reports no complications.  She is followed by heart care, Dr. Aundra Dubin.  HPI  Past Medical History:  Diagnosis Date  . Anxiety   . Arthritis   . Asthma   . Chronic diastolic CHF (congestive heart failure) ([place])   . COPD (chronic obstructive pulmonary disease) (H[facility])    Uses Oxygen at night  . Depression   . Diabetes mellitus without complication ([place])   . Dyspnea    occ  . Family history of thyroid cancer   . GERD (gastroesophageal reflux disease)   . Gout   . Headache    migraines  . History of DVT of lower extremity   . History of nuclear stress test    Myoview 2/17:  Low risk stress nuclear study with a small, moderate intensity, partially reversible inferior lateral defect consistent with small prior infarct and minimal peri-infarct ischemia; EF 68 with normal wall motion  . Hypertension   . OSA (obstructive sleep apnea)   . Pneumonia   . Thyroid cancer ([place]) 09/23/2016    Patient Active Problem List   Diagnosis Date Noted  . Hypertensive disorder 08/28/2017  . Obesity 08/28/2017  . COPD exacerbation ([place]) 08/04/2017  . Acute and chronic respiratory failure with hypercapnia ([place])   . Acute on chronic diastolic heart failure ([place])   . Pulmonary HTN ([place])   . Genetic testing 02/27/2017  .  Family history of thyroid cancer   . Hypomagnesemia 10/03/2016  . Boil, breast 10/03/2016  . Atypical chest pain   . HLD (hyperlipidemia) 09/23/2016  . COPD (chronic obstructive pulmonary disease) ([place]) 09/23/2016  . Gout 09/23/2016  . DVT (deep vein thrombosis) in pregnancy ([place]) 09/23/2016  . Thyroid cancer ([place]) 09/23/2016  . Hypocalcemia 09/23/2016  . AKI (acute kidney injury) ([place]) 09/23/2016  . 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 Acute pulmonary edema ([facility])   . Acute hypoxemic respiratory failure ([place])   . Acute on chronic respiratory failure with hypoxemia ([place])   . Ventilator dependent ([place])   . Respiratory failure ([place]) 07/16/2016  . Chronic  combined systolic (congestive) and diastolic (congestive) heart failure ([place]) 02/10/2016  . Dyspnea   . CAP (community acquired pneumonia) 01/23/2016  . 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 Pulmonary hypertension ([place]) 01/23/2016  . Sepsis ([place]) 01/23/2016  . Obesity hypoventilation syndrome ([place]) 08/26/2015  . Chest pain 06/16/2015  . Dyslipidemia associated with type 2 diabetes mellitus ([place]) 04/24/2015  . Leukocytosis 04/24/2015  . Controlled type 2 diabetes mellitus without complication, without long-term current use of insulin ([place]) 04/24/2015  . Anxiety 04/24/2015  . Benign essential HTN 04/24/2015  . Normocytic anemia 04/24/2015  . Asthma with COPD with exacerbation ([place]) 04/24/2015  . OSA (obstructive sleep apnea) 05/10/2012  . Tobacco abuse 07/04/2011    Past Surgical History:  Procedure Laterality Date  . BUNIONECTOMY Bilateral   . CARDIAC CATHETERIZATION N/A 06/23/2015   Procedure: Right/Left Heart Cath and Coronary Angiography;  Surgeon: [name], MD;  Location: [facility];  Service: Cardiovascular;  Laterality: N/A;  . COLONOSCOPY WITH PROPOFOL N/A 12/15/2015   Procedure: COLONOSCOPY WITH PROPOFOL;  Surgeon: [name], MD;  Location: [facility];  Service: Endoscopy;  Laterality: N/A;  . THYROIDECTOMY  09/19/2016  . THYROIDECTOMY N/A 09/19/2016   Procedure: TOTAL THYROIDECTOMY;  Surgeon: [name], MD;  Location: [facility];  Service: General;  Laterality: N/A;  . TONSILLECTOMY    . TOTAL ABDOMINAL HYSTERECTOMY  07/14/10     OB History   None      Home Medications    Prior to Admission medications   Medication Sig Start Date End Date Taking? Authorizing Provider  albuterol (PROVENTIL HFA;VENTOLIN HFA) 108 (90 BASE) MCG/ACT inhaler Inhale 1-2 puffs into the lungs every 6 (six) hours as needed for wheezing. Patient taking differently: Inhale 1 puff into the lungs 2 (two) times daily.  04/09/12   Billy Fischer, MD    allopurinol (ZYLOPRIM) 100 MG tablet Take 100 mg by mouth daily.    [provider]  Celedonio Miyamoto 62.5-25 MCG/INH AEPB USE ONE PUFF BY MOUTH DAILY 08/20/17   [provider]  aspirin-acetaminophen-caffeine (EXCEDRIN MIGRAINE) [phone] MG tablet Take 2 tablets by mouth daily as needed for headache.    [provider]  Blood Glucose Monitoring Suppl (ACCU-CHEK AVIVA PLUS) w/Device KIT USE AS DIRECTED TO CHECK BLOOD SUGAR 08/07/17   [provider]  budesonide-formoterol (SYMBICORT) 80-4.5 MCG/ACT inhaler Inhale 2 puffs into the lungs 2 (two) times daily. 07/24/16   Lavina Hamman, MD  calcitRIOL (ROCALTROL) 0.5 MCG capsule Take 1 capsule (0.5 mcg total) by mouth daily. 09/26/16   Hongalgi, [name], MD  carvedilol (COREG) 6.25 MG tablet Take 6.25 mg by mouth 2 (two) times daily with a meal. 08/13/17   [provider]  cetirizine (ZYRTEC) 10 MG tablet Take 10 mg  by mouth daily. 07/10/17   [provider]  clonazePAM (KLONOPIN) 2 MG tablet Take 1 mg by mouth 3 (three) times daily. for anxiety 05/08/17   [provider]  diclofenac sodium (VOLTAREN) 1 % GEL Apply 2 g topically 4 (four) times daily as needed (for pain).     [provider]  dicyclomine (BENTYL) 20 MG tablet Take 1 tablet (20 mg total) by mouth 2 (two) times daily. Patient taking differently: Take 20 mg by mouth 2 (two) times daily as needed (for abdominal pain).  06/06/17   Hayden Rasmussen, MD  doxycycline (VIBRA-TABS) 100 MG tablet Take 1 tablet (100 mg total) by mouth 2 (two) times daily. Patient not taking: Reported on 09/11/2017 09/11/17   Tyson Dense T, DPM  EPINEPHrine 0.3 mg/0.3 mL IJ SOAJ injection Inject 0.3 mg into the muscle daily as needed (allergic reaction).    [provider]  FEROSUL 325 (65 Fe) MG tablet Take 325 mg by mouth daily. 03/22/17   [provider]  furosemide (LASIX) 40 MG tablet Take 1 tablet (40 mg total) by mouth 2 (two) times  daily. 08/06/17   [name], DO  glucose blood test strip Use as instructed to check blood sugar up to 4 times daily 08/06/17   [name], DO  HYDROcodone-acetaminophen [facility]) 10-325 MG tablet Take 1 tablet by mouth every 6 (six) hours as needed. For up to 7 days.    [provider]  ipratropium-albuterol (DUONEB) 0.5-2.5 (3) MG/3ML SOLN Use one vial in Nebulizer four times daily as directed. 04/05/17   [provider]  levothyroxine (SYNTHROID, LEVOTHROID) 112 MCG tablet Take 224 mcg by mouth daily. 07/11/17   [provider]  lisinopril (PRINIVIL,ZESTRIL) 5 MG tablet Take 1 tablet (5 mg total) by mouth daily. Please call for office visit [phone] 08/15/17   [name], MD  metFORMIN (GLUCOPHAGE) 500 MG tablet Take 1 tablet (500 mg total) by mouth 2 (two) times daily with a meal. 12/11/14   [name], MD  metoprolol succinate (TOPROL-XL) 25 MG 24 hr tablet Take 1 tablet (25 mg total) by mouth daily. 08/07/17   [name], DO  nicotine (NICODERM CQ - DOSED IN MG/24 HOURS) 14 mg/24hr patch Place 1 patch (14 mg total) onto the skin daily. Patient not taking: Reported on 09/11/2017 07/25/16   Lavina Hamman, MD  nystatin cream (MYCOSTATIN) APPLY TO THE AFFECTED AREA(S) TWICE DAILY AS NEEDED 04/13/17   [provider]  omeprazole (PRILOSEC) 40 MG capsule Take 40 mg by mouth daily. 04/13/15   [provider]  potassium chloride SA (K-DUR,KLOR-CON) 20 MEQ tablet Take 2 tablets (40 mEq total) by mouth daily. Patient taking differently: Take 20 mEq by mouth daily.  08/01/16   [name], MD  pravastatin (PRAVACHOL) 40 MG tablet Take 40 mg by mouth every evening.  03/29/15   [provider]  promethazine (PHENERGAN) 25 MG tablet Take 1 tablet (25 mg total) by mouth every 8 (eight) hours as needed. 07/24/17   Hyatt, Max T, DPM  QC STOOL SOFTENER PLS LAXATIVE 8.6-50 MG tablet Take 1 tablet by mouth at bedtime. 04/11/17   [provider]  tiotropium (SPIRIVA) 18 MCG inhalation capsule Place 18 mcg into inhaler and inhale daily.    [provider]    Family History Family History  Problem Relation Age of Onset  . Cancer Father        thought to be due to  exposure to concrete  . Diabetes Mother   . Thyroid cancer Mother        dx in her 74s-60s  . Cancer Maternal Uncle        2 uncles with cancer NOS  . Brain cancer Paternal Aunt   . Cancer Cousin        maternal first cousin - NOS  . Cancer Cousin        maternal first cousin - NOS    Social History Social History   Tobacco Use  . Smoking status: Former Smoker    Packs/day: 0.50    Years: 41.00    Pack years: 20.50    Types: Cigarettes    Last attempt to quit: 07/2016    Years since quitting: 1.2  . Smokeless tobacco: Never Used  . Tobacco comment: Starting to Wean Off -- using Nicotine Patch  Substance Use Topics  . Alcohol use: Yes    Alcohol/week: 0.0 oz    Comment: ? none now  . Drug use: Yes    Types: Marijuana    Comment: none for long time     Allergies   Bee venom; Ibuprofen; Lamisil [terbinafine]; and Nsaids   Review of Systems Review of Systems  All other systems reviewed and are negative.    Physical Exam Updated Vital Signs BP 134/89 (BP Location: Right Arm)   Pulse 79   Temp 99.2 F (37.3 C) (Oral)   Resp 16   Ht _0  (1.651 m)   Wt 133.8 kg (295 lb)   SpO2 95%   BMI 49.09 kg/m   Physical Exam  Constitutional: She appears well-developed and well-nourished.  HENT:  Head: Normocephalic and atraumatic.  Right Ear: External ear normal.  Left Ear: External ear normal.  Nose: Nose normal.  Mouth/Throat: Uvula is midline, oropharynx is clear and moist and mucous membranes are normal. No tonsillar exudate.  Eyes: Pupils are equal, round, and reactive to light. Right eye exhibits no discharge. Left eye exhibits no discharge. No scleral icterus.  Neck: Trachea normal. Neck supple. No JVD present.  No spinous process tenderness present. Carotid bruit is not present. No neck rigidity. Normal range of motion present.  Cardiovascular: Normal rate, regular rhythm and intact distal pulses.  No murmur heard. Pulses:      Radial pulses are 2+ on the right side, and 2+ on the left side.       Dorsalis pedis pulses are 2+ on the right side, and 2+ on the left side.       Posterior tibial pulses are 2+ on the right side, and 2+ on the left side.  No pitting edema of the lower extremities bilaterally.  Symmetric in size bilaterally.  No calf tenderness palpation.  Pulmonary/Chest: Effort normal and breath sounds normal. She exhibits no tenderness.  Patient sitting no increased work of breathing. No accessory muscle use. Patient is sitting upright, speaking in full sentences without difficulty   Abdominal: Soft. Bowel sounds are normal. There is no tenderness. There is no rebound and no guarding.  Musculoskeletal: She exhibits no edema.  Lymphadenopathy:    She has no cervical adenopathy.  Neurological: She is alert.  Speech clear. Follows commands. No facial droop. PERRLA. EOM grossly intact. CN III-XII grossly intact. Grossly moves all extremities 4 without ataxia. Able and appropriate strength for age to upper and lower extremities bilaterally. Sensation intact to the lower extremity bilaterally.   Skin: Skin is warm and dry. Capillary refill takes less  than 2 seconds. No rash noted. She is not diaphoretic.  Psychiatric: She has a normal mood and affect.  Nursing note and vitals reviewed.    ED Treatments / Results  Labs (all labs ordered are listed, but only abnormal results are displayed) Labs Reviewed  BASIC METABOLIC PANEL - Abnormal; Notable for the following components:      Result Value   Chloride 99 (*)    Calcium 8.4 (*)    All other components within normal limits  CBC - Abnormal; Notable for the following components:   WBC 17.7 (*)    Hemoglobin 11.9 (*)    All other  components within normal limits  BRAIN NATRIURETIC PEPTIDE  D-DIMER, QUANTITATIVE (NOT AT [facility])  I-STAT TROPONIN, ED  I-STAT BETA HCG BLOOD, ED ([facility], [facility], [facility] ONLY)    EKG None  Radiology Dg Chest 2 View  Result Date: 10/01/2017 CLINICAL DATA:  Shortness of breath. EXAM: CHEST - 2 VIEW COMPARISON:  Radiographs of September 11, 2017. FINDINGS: Mild central pulmonary vascular congestion is noted. No pneumothorax or pleural effusion is noted. Mild bibasilar subsegmental atelectasis or scarring is noted. No significant pleural effusion is noted. Bony thorax is unremarkable. IMPRESSION: Stable cardiomegaly with central pulmonary vascular congestion. Stable bibasilar subsegmental atelectasis or scarring. Electronically Signed   By: Marijo Conception, M.D.   On: 10/01/2017 12:30    Procedures Procedures (including critical care time)  Medications Ordered in ED Medications  ipratropium-albuterol (DUONEB) 0.5-2.5 (3) MG/3ML nebulizer solution 3 mL (3 mLs Nebulization Given 10/01/17 1327)  morphine 4 MG/ML injection 4 mg (4 mg Intravenous Given 10/01/17 1327)     Initial Impression / Assessment and Plan / ED Course  I have reviewed the triage vital signs and the nursing notes.  Pertinent labs & imaging results that were available during my care of the patient were reviewed by me and considered in my medical decision making (see chart for details).     57 y.o. female presenting with SOB and leg swelling. Her vital signs are normal & without hypoxia on her baseline 2L o2. Heart is RRR. Lungs are CTA b/l. There is trace non-pitting edema of the lower extremities. She has been compliant with her lasix at home.  Will order chest x-ray, EKG, blood work to evaluate.  Patient is with recent surgery and also increase mobilization, will order d-dimer.  Will try trial of DuoNeb's to see if this improves patient's symptoms.  No wheezing on my exam.  EKG is normal sinus rhythm.  There is no evidence of STEMI.   Troponin is within normal limits.  Chest x-ray is with stable cardiomegaly and vascular congestion. No pleural effusion. D-Dimer is normal. Do not suspect PE. BNP is within normal limits.  She does have leukocytosis of 17.7.  Stable anemia.  There is no significant electrolyte derangements.  No acute kidney injury.  No evidence of DKA.  Pregnancy test is negative.  Patient oxygen saturations have maintained her than 90% while in the department.  She states her breathing has improved and she feels more at baseline.  She told me that her primary care provider has recently told her to increase her Lasix dose but she has not done so yet.  There is a mild amount of vascular congestion noted on her chest x-ray and she has a normal BNP.  Advised her to increase her Lasix at home.  I do not feel that she warrants admission at this time.  Patient stated that she  is ready to go home. On reviewing patients diet she does not increased salt and water intake. Education discussed on diet with heart failure.   The evaluation does not show pathology that would require ongoing emergent intervention or inpatient treatment. I advised the patient to follow-up with PCP this week. Sh has been seen at heart failure clinic in the past but has not follow up in some time. Will recommend that she make an appointment for follow up.  I advised the patient to return to the emergency department with new or worsening symptoms or new concerns. Specific return precautions discussed. The patient verbalized understanding and agreement with plan. All questions answered. No further questions at this time. The patient is hemodynamically stable, mentating appropriately and appears safe for discharge.  Final Clinical Impressions(s) / ED Diagnoses   Final diagnoses:  Shortness of breath  Leg swelling    ED Discharge Orders    None       Lorelle Gibbs 10/02/17 1015    [place], Fredia Sorrow, MD 10/02/17 1123

## 2017-10-01 NOTE — Discharge Instructions (Addendum)
Your workup was reassuring today.  Please continue home medications. Follow-up with your primary care provider and cardiologist this week. Return for any fever, chest pain, cough with bloody sputum, worsening shortness of breath or any other concerning symptoms.

## 2017-10-02 ENCOUNTER — Telehealth: Payer: Self-pay | Admitting: Podiatry

## 2017-10-02 NOTE — Telephone Encounter (Signed)
If she is comfortable in tennis shoes she can wear what is tolerable for her. I'm not sure what cream she is referring to. Check and see what she is using.

## 2017-10-02 NOTE — Telephone Encounter (Signed)
I informed pt, Dr. Milinda Pointer had stated she could wear the athletic shoes if comfortable and use the voltaren gel on the bottom of the feet. I informed pt she had an appt 11/29/2017.

## 2017-10-02 NOTE — Telephone Encounter (Signed)
Patient wants to know if she can wear her tennis shoes and if she can put the gel on the bottom of her foot? If you can call the patient back at [phone].

## 2017-10-09 ENCOUNTER — Other Ambulatory Visit (HOSPITAL_COMMUNITY): Payer: Self-pay | Admitting: Cardiology

## 2017-10-15 ENCOUNTER — Other Ambulatory Visit (HOSPITAL_COMMUNITY): Payer: Self-pay | Admitting: Cardiology

## 2017-10-22 ENCOUNTER — Emergency Department (HOSPITAL_COMMUNITY): Payer: Medicaid Other

## 2017-10-22 ENCOUNTER — Emergency Department (HOSPITAL_COMMUNITY)
Admission: EM | Admit: 2017-10-22 | Discharge: 2017-10-23 | Disposition: A | Discharge status: Home / Self Care | Payer: Medicaid Other | Attending: Emergency Medicine | Admitting: Emergency Medicine

## 2017-10-22 ENCOUNTER — Encounter (HOSPITAL_COMMUNITY): Payer: Self-pay | Admitting: Emergency Medicine

## 2017-10-22 DIAGNOSIS — E119 Type 2 diabetes mellitus without complications: Secondary | ICD-10-CM | POA: Insufficient documentation

## 2017-10-22 DIAGNOSIS — I5042 Chronic combined systolic (congestive) and diastolic (congestive) heart failure: Secondary | ICD-10-CM | POA: Diagnosis not present

## 2017-10-22 DIAGNOSIS — J449 Chronic obstructive pulmonary disease, unspecified: Secondary | ICD-10-CM | POA: Insufficient documentation

## 2017-10-22 DIAGNOSIS — R197 Diarrhea, unspecified: Secondary | ICD-10-CM | POA: Insufficient documentation

## 2017-10-22 DIAGNOSIS — R109 Unspecified abdominal pain: Secondary | ICD-10-CM

## 2017-10-22 DIAGNOSIS — I110 Hypertensive heart disease with heart failure: Secondary | ICD-10-CM | POA: Insufficient documentation

## 2017-10-22 DIAGNOSIS — R0600 Dyspnea, unspecified: Secondary | ICD-10-CM | POA: Diagnosis not present

## 2017-10-22 DIAGNOSIS — R110 Nausea: Secondary | ICD-10-CM | POA: Insufficient documentation

## 2017-10-22 LAB — LIPASE, BLOOD: LIPASE: 32 U/L (ref 11–51)

## 2017-10-22 LAB — COMPREHENSIVE METABOLIC PANEL
ALT: 14 U/L (ref 0–44)
AST: 16 U/L (ref 15–41)
Albumin: 3.6 g/dL (ref 3.5–5.0)
Alkaline Phosphatase: 69 U/L (ref 38–126)
Anion gap: 9 (ref 5–15)
BILIRUBIN TOTAL: 0.6 mg/dL (ref 0.3–1.2)
BUN: 7 mg/dL (ref 6–20)
CHLORIDE: 97 mmol/L — AB (ref 98–111)
CO2: 33 mmol/L — ABNORMAL HIGH (ref 22–32)
Calcium: 8.9 mg/dL (ref 8.9–10.3)
Creatinine, Ser: 0.53 mg/dL (ref 0.44–1.00)
GFR calc non Af Amer: >60 mL/min (ref >60)
GLUCOSE: 113 mg/dL — AB (ref 70–99)
POTASSIUM: 4.3 mmol/L (ref 3.5–5.1)
Sodium: 139 mmol/L (ref 135–145)
Total Protein: 6.9 g/dL (ref 6.5–8.1)

## 2017-10-22 LAB — CBC
HEMATOCRIT: 40.3 % (ref 36.0–46.0)
Hemoglobin: 11.9 g/dL — ABNORMAL LOW (ref 12.0–15.0)
MCH: 28.2 pg (ref 26.0–34.0)
MCHC: 29.5 g/dL — AB (ref 30.0–36.0)
MCV: 95.5 fL (ref 78.0–100.0)
Platelets: 238 10*3/uL (ref 150–400)
RBC: 4.22 MIL/uL (ref 3.87–5.11)
RDW: 13.9 % (ref 11.5–15.5)
WBC: 14.3 10*3/uL — ABNORMAL HIGH (ref 4.0–10.5)

## 2017-10-22 NOTE — ED Triage Notes (Signed)
Per EMS- pt arrives from home for c.o. Shortness of breath, hx COPD and wears 2L O2 prn. 93% on EMS 2 L. Pt also c.o. Several days of generalized abdominal pain with nausea and diarrhea.

## 2017-10-23 LAB — URINALYSIS, ROUTINE W REFLEX MICROSCOPIC
BILIRUBIN URINE: NEGATIVE
Glucose, UA: NEGATIVE mg/dL
HGB URINE DIPSTICK: NEGATIVE
Ketones, ur: NEGATIVE mg/dL
Leukocytes, UA: NEGATIVE
Nitrite: NEGATIVE
PROTEIN: NEGATIVE mg/dL
SPECIFIC GRAVITY, URINE: 1.013 (ref 1.005–1.030)
pH: 5 (ref 5.0–8.0)

## 2017-10-23 MED ORDER — METHYLPREDNISOLONE SODIUM SUCC 125 MG IJ SOLR
INTRAMUSCULAR | Status: AC
  Filled 2017-10-23: qty 2

## 2017-10-23 MED ORDER — PREDNISONE 20 MG PO TABS: ORAL_TABLET | ORAL | 0 refills | Status: DC

## 2017-10-23 MED ORDER — METOCLOPRAMIDE HCL 10 MG PO TABS: 10.0000 mg | ORAL_TABLET | Freq: Four times a day (QID) | ORAL | 0 refills | Status: DC | PRN

## 2017-10-23 MED ORDER — LOPERAMIDE HCL 2 MG PO CAPS
4.0000 mg | ORAL_CAPSULE | Freq: Once | ORAL | Status: AC
  Administered 2017-10-23: 4 mg via ORAL
  Filled 2017-10-23: qty 2

## 2017-10-23 MED ORDER — LOPERAMIDE HCL 2 MG PO CAPS: 2.0000 mg | ORAL_CAPSULE | Freq: Four times a day (QID) | ORAL | 0 refills | Status: DC | PRN

## 2017-10-23 MED ORDER — ONDANSETRON HCL 4 MG/2ML IJ SOLN
4.0000 mg | Freq: Once | INTRAMUSCULAR | Status: AC
  Administered 2017-10-23: 4 mg via INTRAVENOUS
  Filled 2017-10-23: qty 2

## 2017-10-23 MED ORDER — MORPHINE SULFATE (PF) 4 MG/ML IV SOLN
4.0000 mg | Freq: Once | INTRAVENOUS | Status: AC
  Administered 2017-10-23: 4 mg via INTRAVENOUS
  Filled 2017-10-23: qty 1

## 2017-10-23 MED ORDER — ACETAMINOPHEN 325 MG PO TABS
650.0000 mg | ORAL_TABLET | Freq: Once | ORAL | Status: DC
  Filled 2017-10-23: qty 2

## 2017-10-23 MED ORDER — SODIUM CHLORIDE 0.9 % IV BOLUS
500.0000 mL | Freq: Once | INTRAVENOUS | Status: AC
  Administered 2017-10-23: 500 mL via INTRAVENOUS

## 2017-10-23 MED ORDER — IPRATROPIUM-ALBUTEROL 0.5-2.5 (3) MG/3ML IN SOLN
3.0000 mL | Freq: Once | RESPIRATORY_TRACT | Status: AC
  Administered 2017-10-23: 3 mL via RESPIRATORY_TRACT
  Filled 2017-10-23: qty 3

## 2017-10-23 MED ORDER — METHYLPREDNISOLONE SODIUM SUCC 125 MG IJ SOLR
125.0000 mg | Freq: Once | INTRAMUSCULAR | Status: AC
  Administered 2017-10-23: 125 mg via INTRAVENOUS
  Filled 2017-10-23: qty 2

## 2017-10-23 NOTE — ED Provider Notes (Signed)
[facility] EMERGENCY DEPARTMENT Provider Note   CSN: [id number] Arrival date & time: 10/22/17  1827     History   Chief Complaint Chief Complaint  Patient presents with  . Abdominal Pain  . Nausea  . Diarrhea  . Shortness of Breath    HPI Michele Owens is a 57 y.o. female.  The history is provided by the patient.  She has history of COPD, diabetes, hypertension, diastolic heart failure and comes in because of generally not feeling well.  She states that she got a refill of 1 of her medications and it was a blue pill.  After taking that for a few days, she broke out in a rash on her back so she stopped taking the pill.  This was about 1 week ago.  Over that same time, she has developed generalized body aches including abdominal pain.  She is also complaining of subjective fever as well as chills and sweats.  There has been a cough productive of some clear sputum.  She has noted some worsening of her baseline dyspnea.  She gets slight, temporary relief with her home albuterol nebulizer.  She rates her abdominal pain at 10/10.  There is associated nausea but no vomiting.  She has been having diarrhea with 5-6 watery bowel movements a day.  There have been no known sick contacts.  She has not done anything at home to treat her symptoms.  He states this is generally similar to what she had with an ED visit in February.  Past Medical History:  Diagnosis Date  . Anxiety   . Arthritis   . Asthma   . Chronic diastolic CHF (congestive heart failure) ([place])   . COPD (chronic obstructive pulmonary disease) (HCC)    Uses Oxygen at night  . Depression   . Diabetes mellitus without complication ([place])   . Dyspnea    occ  . Family history of thyroid cancer   . GERD (gastroesophageal reflux disease)   . Gout   . Headache    migraines  . History of DVT of lower extremity   . History of nuclear stress test    Myoview 2/17:  Low risk stress nuclear study with a small,  moderate intensity, partially reversible inferior lateral defect consistent with small prior infarct and minimal peri-infarct ischemia; EF 68 with normal wall motion  . Hypertension   . OSA (obstructive sleep apnea)   . Pneumonia   . Thyroid cancer ([place]) 09/23/2016    Patient Active Problem List   Diagnosis Date Noted  . Hypertensive disorder 08/28/2017  . Obesity 08/28/2017  . COPD exacerbation ([place]) 08/04/2017  . Acute and chronic respiratory failure with hypercapnia ([place])   . Acute on chronic diastolic heart failure ([place])   . Pulmonary HTN ([place])   . Genetic testing 02/27/2017  . Family history of thyroid cancer   . Hypomagnesemia 10/03/2016  . Boil, breast 10/03/2016  . Atypical chest pain   . HLD (hyperlipidemia) 09/23/2016  . COPD (chronic obstructive pulmonary disease) ([place]) 09/23/2016  . Gout 09/23/2016  . DVT (deep vein thrombosis) in pregnancy ([place]) 09/23/2016  . Thyroid cancer ([place]) 09/23/2016  . Hypocalcemia 09/23/2016  . AKI (acute kidney injury) ([place]) 09/23/2016  . 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 Acute pulmonary edema (HCC)   . Acute hypoxemic respiratory failure ([place])   . Acute on chronic respiratory failure with hypoxemia ([place])   . Ventilator dependent ([place])   . Respiratory failure (  [place]) 07/16/2016  . Chronic combined systolic (congestive) and diastolic (congestive) heart failure ([place]) 02/10/2016  . Dyspnea   . CAP (community acquired pneumonia) 01/23/2016  . 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 Pulmonary hypertension ([place]) 01/23/2016  . Sepsis ([place]) 01/23/2016  . Obesity hypoventilation syndrome ([place]) 08/26/2015  . Chest pain 06/16/2015  . Dyslipidemia associated with type 2 diabetes mellitus ([place]) 04/24/2015  . Leukocytosis 04/24/2015  . Controlled type 2 diabetes mellitus without complication, without long-term current use of insulin ([place]) 04/24/2015  . Anxiety 04/24/2015  . Benign essential HTN  04/24/2015  . Normocytic anemia 04/24/2015  . Asthma with COPD with exacerbation ([place]) 04/24/2015  . OSA (obstructive sleep apnea) 05/10/2012  . Tobacco abuse 07/04/2011    Past Surgical History:  Procedure Laterality Date  . BUNIONECTOMY Bilateral   . CARDIAC CATHETERIZATION N/A 06/23/2015   Procedure: Right/Left Heart Cath and Coronary Angiography;  Surgeon: [name], MD;  Location: [facility];  Service: Cardiovascular;  Laterality: N/A;  . COLONOSCOPY WITH PROPOFOL N/A 12/15/2015   Procedure: COLONOSCOPY WITH PROPOFOL;  Surgeon: [name], MD;  Location: [facility];  Service: Endoscopy;  Laterality: N/A;  . THYROIDECTOMY  09/19/2016  . THYROIDECTOMY N/A 09/19/2016   Procedure: TOTAL THYROIDECTOMY;  Surgeon: [name], MD;  Location: [facility];  Service: General;  Laterality: N/A;  . TONSILLECTOMY    . TOTAL ABDOMINAL HYSTERECTOMY  07/14/10     OB History   None      Home Medications    Prior to Admission medications   Medication Sig Start Date End Date Taking? Authorizing Provider  albuterol (PROVENTIL HFA;VENTOLIN HFA) 108 (90 BASE) MCG/ACT inhaler Inhale 1-2 puffs into the lungs every 6 (six) hours as needed for wheezing. Patient taking differently: Inhale 1 puff into the lungs 2 (two) times daily.  04/09/12   Billy Fischer, MD  allopurinol (ZYLOPRIM) 100 MG tablet Take 100 mg by mouth daily.    [provider]  Celedonio Miyamoto 62.5-25 MCG/INH AEPB USE ONE PUFF BY MOUTH DAILY 08/20/17   [provider]  Blood Glucose Monitoring Suppl (ACCU-CHEK AVIVA PLUS) w/Device KIT USE AS DIRECTED TO CHECK BLOOD SUGAR 08/07/17   [provider]  budesonide-formoterol (SYMBICORT) 80-4.5 MCG/ACT inhaler Inhale 2 puffs into the lungs 2 (two) times daily. 07/24/16   Lavina Hamman, MD  calcitRIOL (ROCALTROL) 0.5 MCG capsule Take 1 capsule (0.5 mcg total) by mouth daily. 09/26/16   Hongalgi, [name], MD  carvedilol (COREG) 6.25 MG tablet Take 1 tablet (6.25  mg total) by mouth 2 (two) times daily with a meal. Needs office visit 10/10/17   Bensimhon, [name], MD  cetirizine (ZYRTEC) 10 MG tablet Take 10 mg by mouth daily. 07/10/17   [provider]  clonazePAM (KLONOPIN) 2 MG tablet Take 2 mg by mouth 3 (three) times daily. for anxiety 05/08/17   [provider]  diclofenac sodium (VOLTAREN) 1 % GEL Apply 2 g topically 4 (four) times daily as needed (for pain).     [provider]  dicyclomine (BENTYL) 20 MG tablet Take 1 tablet (20 mg total) by mouth 2 (two) times daily. Patient taking differently: Take 20 mg by mouth 2 (two) times daily as needed (for abdominal pain).  06/06/17   Hayden Rasmussen, MD  EPINEPHrine 0.3 mg/0.3 mL IJ SOAJ injection Inject 0.3 mg into the muscle daily as needed (allergic reaction).    [provider]  FEROSUL 325 [phone]  Fe) MG tablet Take 325 mg by mouth daily. 03/22/17   [provider]  furosemide (LASIX) 40 MG tablet Take 1 tablet (40 mg total) by mouth 2 (two) times daily. 08/06/17   [name], DO  glucose blood test strip Use as instructed to check blood sugar up to 4 times daily 08/06/17   [name], DO  HYDROcodone-acetaminophen [facility]) 10-325 MG tablet Take 1 tablet by mouth every 6 (six) hours as needed. For up to 7 days.    [provider]  ipratropium-albuterol (DUONEB) 0.5-2.5 (3) MG/3ML SOLN Use one vial in Nebulizer four times daily as directed. 04/05/17   [provider]  levothyroxine (SYNTHROID, LEVOTHROID) 112 MCG tablet Take 224 mcg by mouth daily. 07/11/17   [provider]  lisinopril (PRINIVIL,ZESTRIL) 5 MG tablet Take 1 tablet (5 mg total) by mouth daily. Please call for office visit [phone] 08/15/17   [name], MD  metFORMIN (GLUCOPHAGE) 500 MG tablet Take 1 tablet (500 mg total) by mouth 2 (two) times daily with a meal. 12/11/14   [name], MD  metoprolol succinate (TOPROL-XL) 25 MG 24 hr tablet Take 1 tablet  (25 mg total) by mouth daily. 08/07/17   [name], DO  nicotine (NICODERM CQ - DOSED IN MG/24 HOURS) 14 mg/24hr patch Place 1 patch (14 mg total) onto the skin daily. Patient not taking: Reported on 09/11/2017 07/25/16   Lavina Hamman, MD  nystatin cream (MYCOSTATIN) APPLY TO THE AFFECTED AREA(S) TWICE DAILY AS NEEDED yeast 04/13/17   [provider]  omeprazole (PRILOSEC) 40 MG capsule Take 40 mg by mouth daily. 04/13/15   [provider]  potassium chloride SA (K-DUR,KLOR-CON) 20 MEQ tablet Take 2 tablets (40 mEq total) by mouth daily. Patient taking differently: Take 20 mEq by mouth daily.  08/01/16   [name], MD  pravastatin (PRAVACHOL) 40 MG tablet Take 40 mg by mouth every evening.  03/29/15   [provider]  promethazine (PHENERGAN) 25 MG tablet Take 1 tablet (25 mg total) by mouth every 8 (eight) hours as needed. 07/24/17   Hyatt, Max T, DPM  QC STOOL SOFTENER PLS LAXATIVE 8.6-50 MG tablet Take 1 tablet by mouth at bedtime. 04/11/17   [provider]    Family History Family History  Problem Relation Age of Onset  . Cancer Father        thought to be due to exposure to concrete  . Diabetes Mother   . Thyroid cancer Mother        dx in her 25s-60s  . Cancer Maternal Uncle        2 uncles with cancer NOS  . Brain cancer Paternal Aunt   . Cancer Cousin        maternal first cousin - NOS  . Cancer Cousin        maternal first cousin - NOS    Social History Social History   Tobacco Use  . Smoking status: Former Smoker    Packs/day: 0.50    Years: 41.00    Pack years: 20.50    Types: Cigarettes    Last attempt to quit: 07/2016    Years since quitting: 1.2  . Smokeless tobacco: Never Used  . Tobacco comment: Starting to Wean Off -- using Nicotine Patch  Substance Use Topics  . Alcohol use: Yes    Alcohol/week: 0.0 oz    Comment: ? none now  . Drug use: Yes    Types:  Marijuana    Comment: none for long time     Allergies     Bee venom; Ibuprofen; Lamisil [terbinafine]; and Nsaids   Review of Systems Review of Systems  All other systems reviewed and are negative.    Physical Exam Updated Vital Signs BP 116/75 (BP Location: Right Arm)   Pulse 86   Temp 98.5 F (36.9 C) (Oral)   Resp 17   SpO2 100%   Physical Exam  Nursing note and vitals reviewed.  Morbidly obese 57 year old female, resting comfortably and in no acute distress. Vital signs are normal. Oxygen saturation is 100%, which is normal. Head is normocephalic and atraumatic. PERRLA, EOMI. Oropharynx is clear. Neck is nontender and supple without adenopathy or JVD. Back is nontender and there is no CVA tenderness. Lungs have distant breath sounds with scattered expiratory wheezes.  There are no rales or rhonchi. Chest is nontender. Heart has regular rate and rhythm without murmur. Abdomen is soft, flat, with mild to moderate epigastric tenderness.  There is no rebound or guarding.  There are no masses or hepatosplenomegaly and peristalsis is normoactive. Extremities have 1+ edema, full range of motion is present. Skin is warm and dry without rash. Neurologic: Mental status is normal, cranial nerves are intact, there are no motor or sensory deficits.  ED Treatments / Results  Labs (all labs ordered are listed, but only abnormal results are displayed) Labs Reviewed  COMPREHENSIVE METABOLIC PANEL - Abnormal; Notable for the following components:      Result Value   Chloride 97 (*)    CO2 33 (*)    Glucose, Bld 113 (*)    All other components within normal limits  CBC - Abnormal; Notable for the following components:   WBC 14.3 (*)    Hemoglobin 11.9 (*)    MCHC 29.5 (*)    All other components within normal limits  LIPASE, BLOOD  URINALYSIS, ROUTINE W REFLEX MICROSCOPIC    EKG EKG Interpretation  Date/Time:  Monday October 22 2017 18:34:52 EDT Ventricular Rate:  80 PR Interval:  172 QRS Duration: 66 QT Interval:  374 QTC  Calculation: 431 R Axis:   54 Text Interpretation:  Normal sinus rhythm Low voltage QRS Cannot rule out Anterior infarct , age undetermined Abnormal ECG When compared with ECG of 10/01/2017, No significant change was found Confirmed by [name] ([id number]) on 10/22/2017 11:06:05 PM   Radiology Dg Chest 2 View  Result Date: 10/22/2017 CLINICAL DATA:  Shortness of breath.  History of COPD. EXAM: CHEST - 2 VIEW COMPARISON:  Chest radiograph October 01, 2017 FINDINGS: Cardiac silhouette is similarly enlarged. Calcified aortic arch. Pulmonary vascular congestion. LEFT greater than RIGHT mid and lower lung zone strandy densities. Blunting the RIGHT costophrenic angle. No pneumothorax. Surgical clips at thoracic inlet most compatible with thyroidectomy. Large body habitus. IMPRESSION: Stable cardiomegaly and pulmonary vascular congestion. Similar atelectasis/scarring. Small suspected RIGHT pleural effusion. Aortic Atherosclerosis (ICD10-I70.0). Electronically Signed   By: Elon Alas M.D.   On: 10/22/2017 19:35    Procedures Procedures  Medications Ordered in ED Medications  acetaminophen (TYLENOL) tablet 650 mg (650 mg Oral Not Given 10/23/17 0257)  sodium chloride 0.9 % bolus 500 mL (500 mLs Intravenous New Bag/Given 10/23/17 0353)  ondansetron (ZOFRAN) injection 4 mg (4 mg Intravenous Given 10/23/17 0257)  ipratropium-albuterol (DUONEB) 0.5-2.5 (3) MG/3ML nebulizer solution 3 mL (3 mLs Nebulization Given 10/23/17 0257)  loperamide (IMODIUM) capsule 4 mg (4 mg Oral Given 10/23/17 0256)  methylPREDNISolone  sodium succinate (SOLU-MEDROL) 125 mg/2 mL injection 125 mg (125 mg Intravenous Given 10/23/17 0257)  morphine 4 MG/ML injection 4 mg (4 mg Intravenous Given 10/23/17 0426)     Initial Impression / Assessment and Plan / ED Course  I have reviewed the triage vital signs and the nursing notes.  Pertinent labs & imaging results that were available during my care of the patient were reviewed by me and  considered in my medical decision making (see chart for details).  Multiple complaints which seem to call us as a viral illness.  Chest x-ray shows no evidence of pneumonia.  WBC is mildly elevated, but on review of past records, WBC is frequently in similar range.  ECG is unchanged from prior.  No red flags to suggest more serious illness.  Old records are reviewed and she did have a similar presentation in February of this year at which time CT of the abdomen and pelvis showed no acute process.  At this point, I do not see any any indication for repeat CT scan.  She will be given IV fluids, oral acetaminophen, loperamide and will be given albuterol with ipratropium by nebulizer and reassessed.  She feels significantly better after above-noted treatment.  On reexam, lungs are clear.  Patient now states that the blue pill that broke her out was butalbital.  She had been taking that for migraine headaches and when something for her headaches.  Abdominal exam is repeated and is benign.  She is discharged with prescriptions for loperamide, metoclopramide, prednisone.  Advised of metoclopramide is effective both for nausea and headaches.  Follow-up with PCP in 3 days.  Told to continue using her home nebulizer every 4 hours as needed.  Return precautions discussed.  Final Clinical Impressions(s) / ED Diagnoses   Final diagnoses:  Abdominal pain, unspecified abdominal location  Diarrhea of presumed infectious origin  Nausea  Chronic obstructive pulmonary disease, unspecified COPD type [facility])    ED Discharge Orders        Ordered    metoCLOPramide (REGLAN) 10 MG tablet  Every 6 hours PRN     10/23/17 0517    loperamide (IMODIUM) 2 MG capsule  4 times daily PRN     10/23/17 0517    predniSONE (DELTASONE) 20 MG tablet     26/94/85 [id number]       [name], MD 03/50/09 [phone]

## 2017-10-23 NOTE — Discharge Instructions (Addendum)
Return if symptoms are getting worse. °

## 2017-10-23 NOTE — ED Notes (Signed)
Pt discharged from ED; instructions provided and scripts given; Pt encouraged to return to ED if symptoms worsen and to f/u with PCP; Pt verbalized understanding of all instructions 

## 2017-11-27 ENCOUNTER — Ambulatory Visit: Payer: Medicaid Other | Admitting: Podiatry

## 2017-11-27 ENCOUNTER — Encounter: Payer: Self-pay | Admitting: Podiatry

## 2017-11-27 DIAGNOSIS — B351 Tinea unguium: Secondary | ICD-10-CM | POA: Diagnosis not present

## 2017-11-27 DIAGNOSIS — E1151 Type 2 diabetes mellitus with diabetic peripheral angiopathy without gangrene: Secondary | ICD-10-CM

## 2017-11-27 DIAGNOSIS — M79676 Pain in unspecified toe(s): Secondary | ICD-10-CM | POA: Diagnosis not present

## 2017-11-27 DIAGNOSIS — M722 Plantar fascial fibromatosis: Secondary | ICD-10-CM

## 2017-11-28 ENCOUNTER — Telehealth: Payer: Self-pay | Admitting: Orthotics

## 2017-11-28 NOTE — Progress Notes (Signed)
She presents today for follow-up and painful elongated toenails.  She states that her feet hurt she refers to the heels bilaterally.  She is also would like to see by getting a new pair of diabetic shoes.  Objective: Vital signs are stable alert and oriented x3 pulses are dampened PT and DP bilaterally capillary fill time is somewhat sluggish mild hammertoe deformities.  No open lesions or wounds are noted.  Muscle strength is normal symmetrical bilateral.  Orthopedic evaluation demonstrates mild digital deformities but nothing rigid.  Pain on palpation medial calcaneal tubercles bilateral.  Assessment: Plantar fasciitis bilateral.  Diabetic angiopathy neuropathy.  Pain in limb secondary to onychomycosis.  Plan: We will get her into a pair of diabetic shoes hopefully this help alleviate her symptoms in her heel which very well may be associated with neuropathy.  Also debrided her toenails she was sized today for her shoes.  I also injected the bilateral heels with 20 mg Kenalog 5 mg Marcaine point maximal tenderness bilateral heel at the plantar fashion calcaneal insertion site.

## 2017-11-28 NOTE — Telephone Encounter (Signed)
Tried to leave voice mail message to advise that Medicaid will not cover DBS through [facility].

## 2017-11-29 ENCOUNTER — Ambulatory Visit: Payer: Medicaid Other | Admitting: Podiatry

## 2017-12-04 ENCOUNTER — Other Ambulatory Visit: Payer: Self-pay | Admitting: Internal Medicine

## 2017-12-04 DIAGNOSIS — Z1231 Encounter for screening mammogram for malignant neoplasm of breast: Secondary | ICD-10-CM

## 2017-12-28 IMAGING — CR DG CHEST 2V
2 series · 2 of 2 positions shown · non-contrast
Comparison: 12/07/2014

CLINICAL DATA: Shortness of breath, BILATERAL wheezing, history
hypertension, COPD, asthma, chronic bronchitis, smoking

EXAM:
CHEST  2 VIEW

[w chest lat]
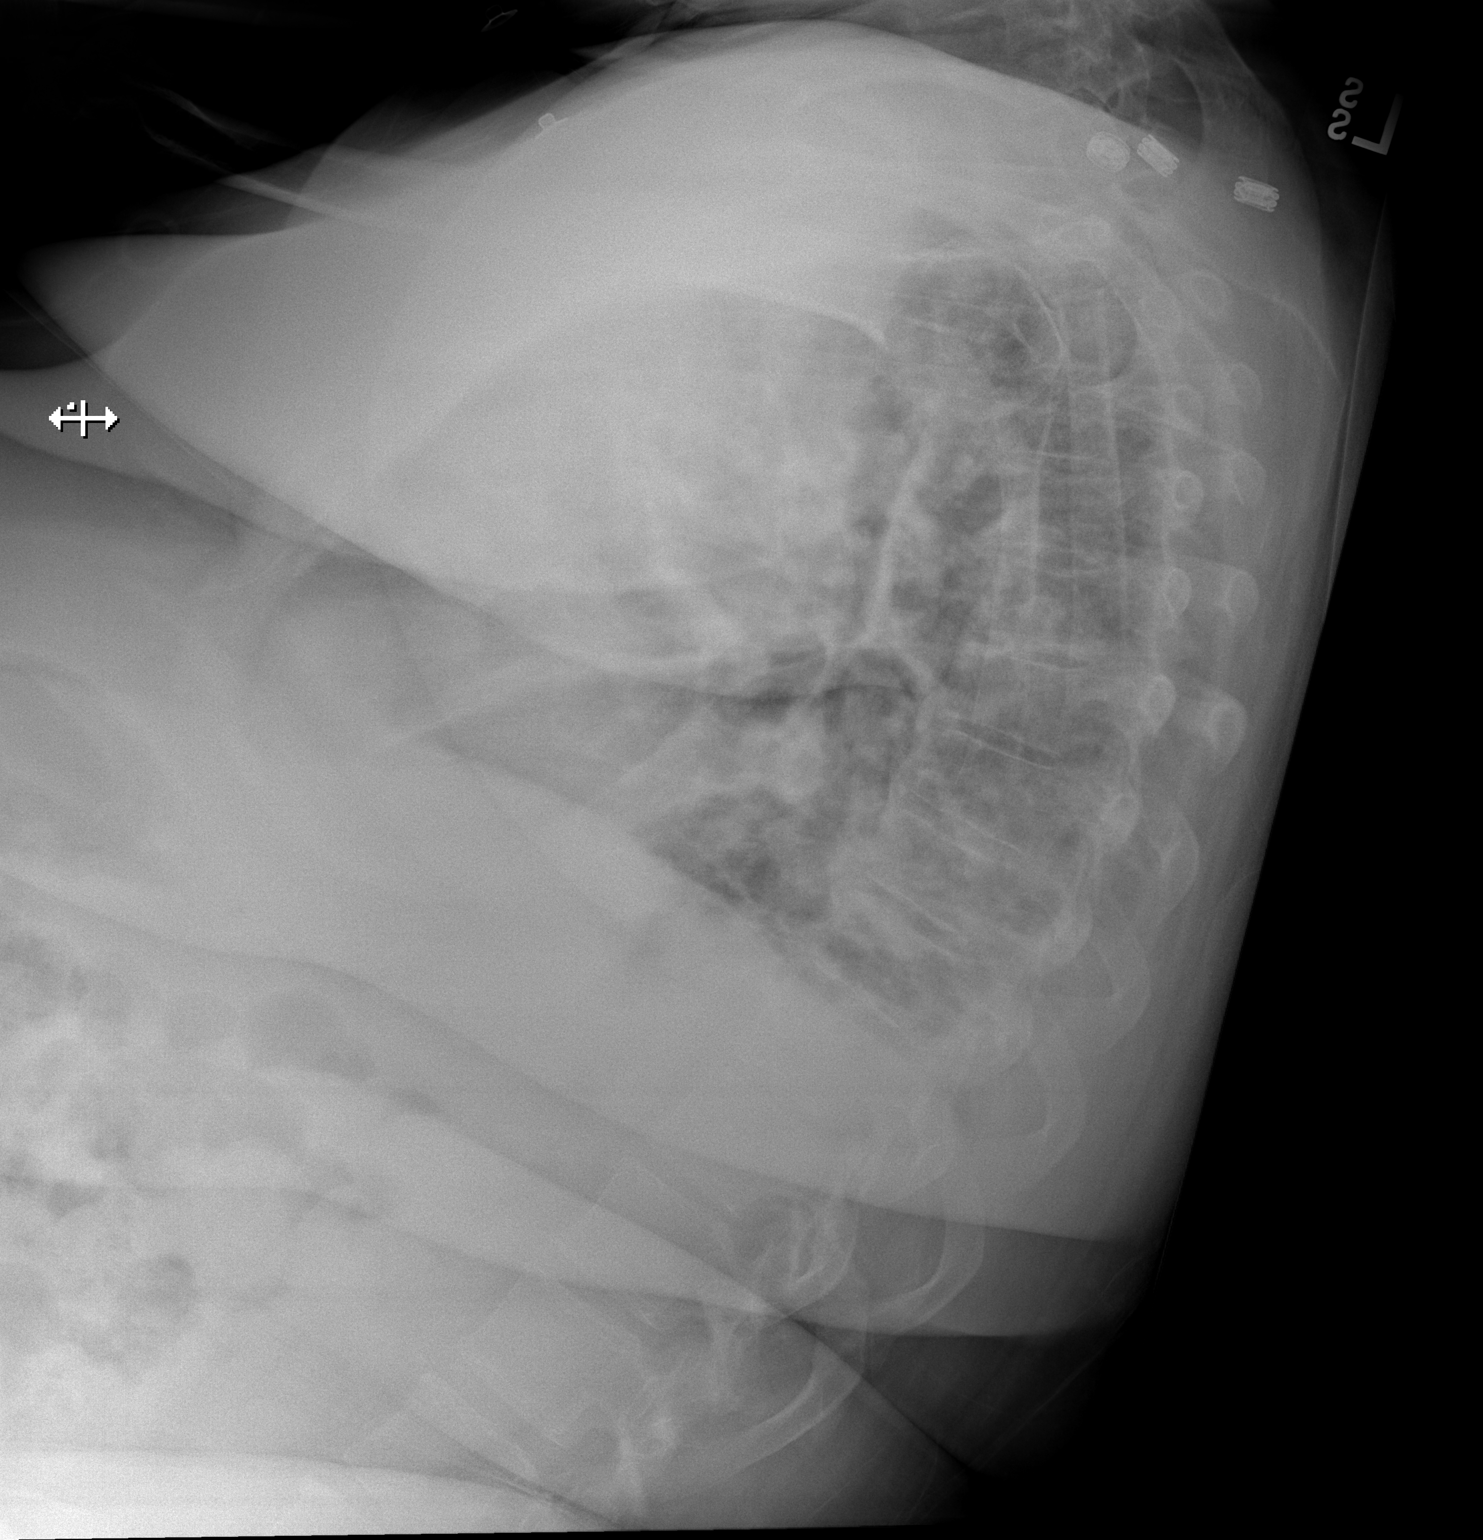

[x chest ap]
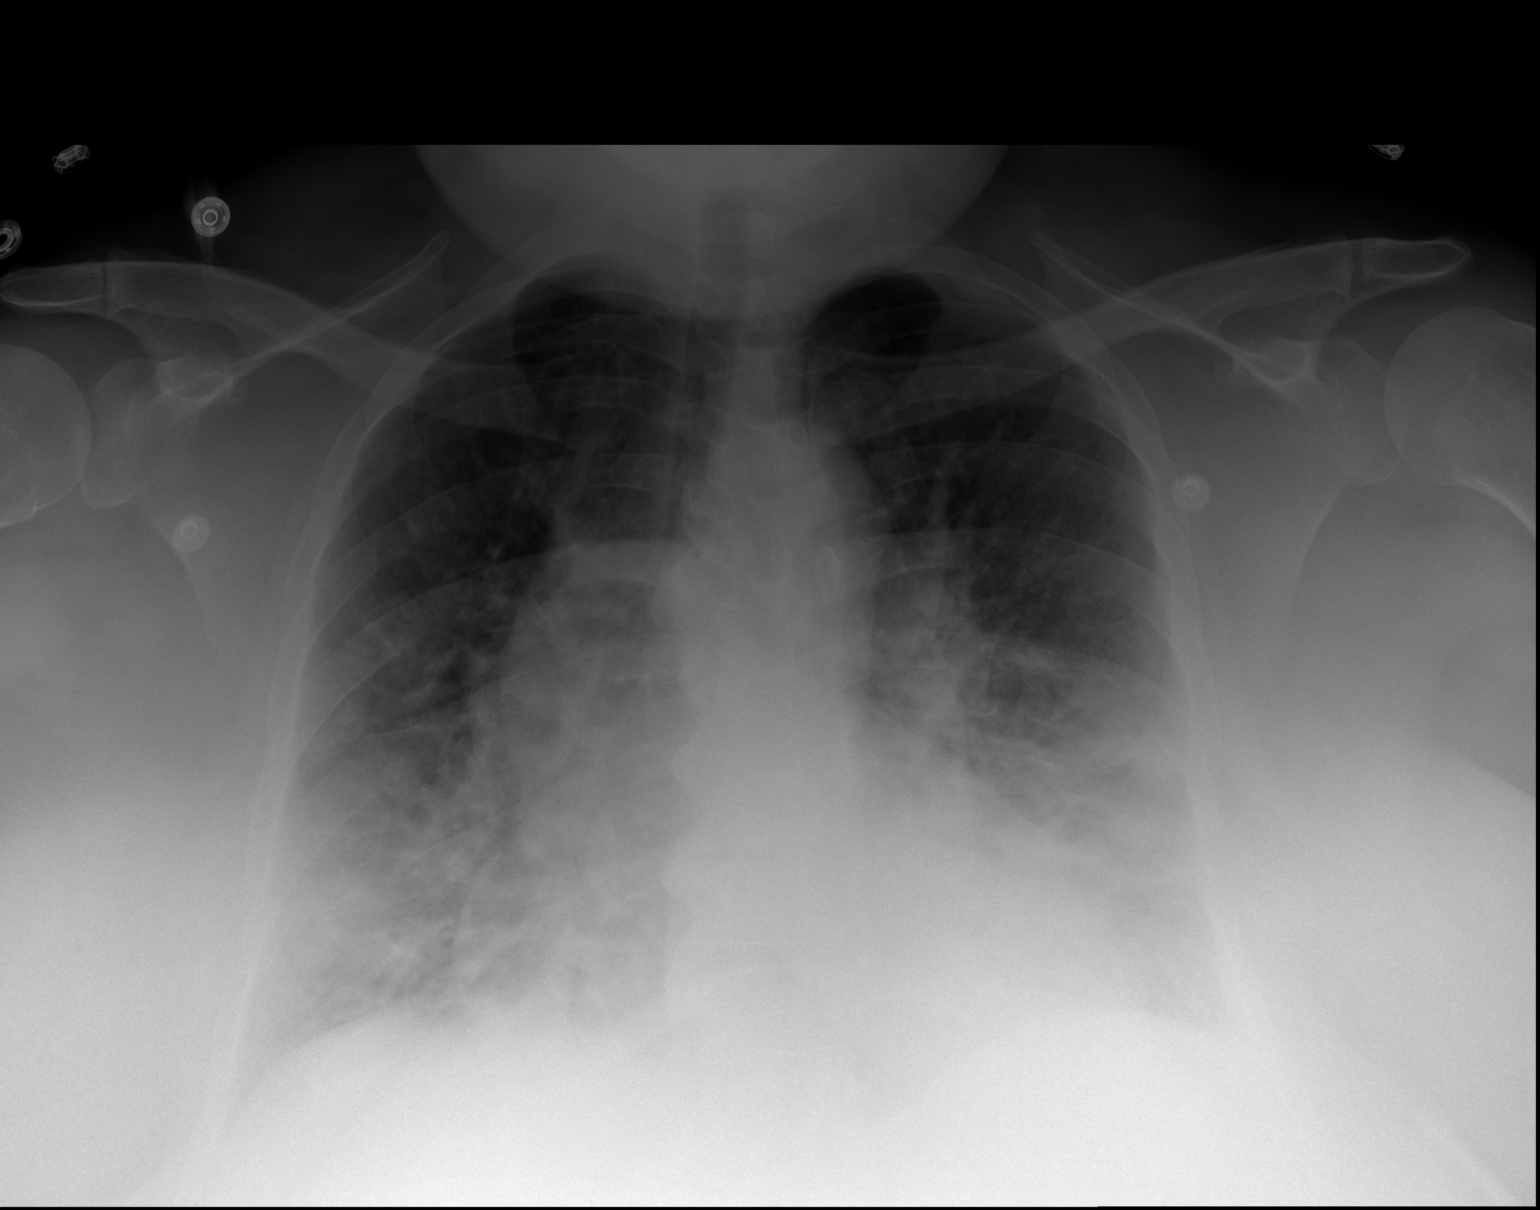

[2 of 2 positions shown; findings below may reference images not displayed]

FINDINGS: Enlargement of cardiac silhouette with pulmonary vascular
congestion.

Mediastinal contours normal.

Perihilar and basilar infiltrates likely pulmonary edema.

No gross pleural effusion or pneumothorax.

Scattered endplate spur formation thoracic spine.
IMPRESSION: Mild CHF, little changed versus prior exam.

## 2017-12-28 IMAGING — CT CT HEAD W/O CM
2 series · 16 of 30 positions shown, 20 images · non-contrast
Comparison: February 17, 2013

CLINICAL DATA: Acute onset slurred speech

EXAM:
CT HEAD WITHOUT CONTRAST
TECHNIQUE: Contiguous axial images were obtained from the base of the skull
through the vertex without intravenous contrast.

[Series 2: head w/o · axial · non-contrast · 0.45mm/px · z∈[-246,-126]mm · 13 of 30 slices shown, 17 images]
[im 3/30  brain]
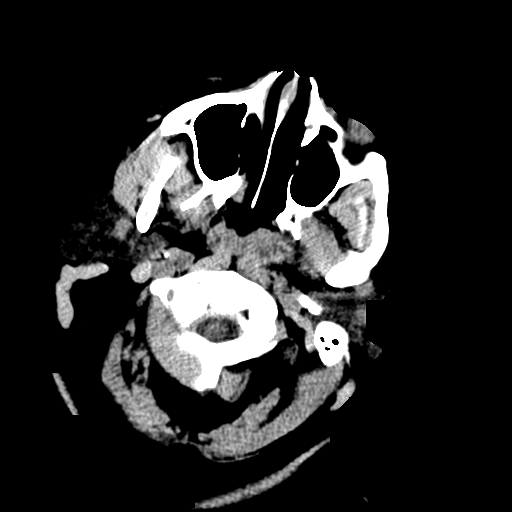
[im 3/30  bone]
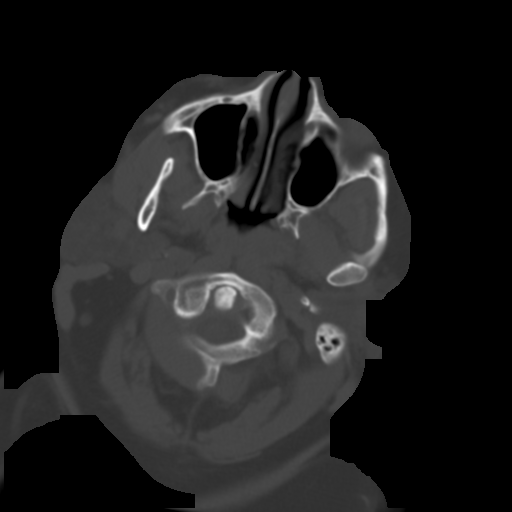
[im 5/30  brain]
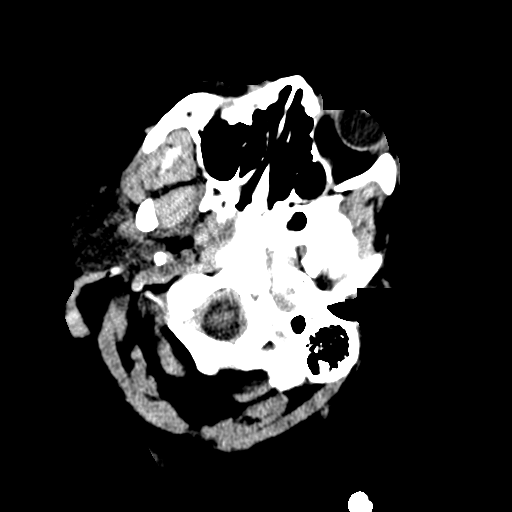
[im 7/30  brain]
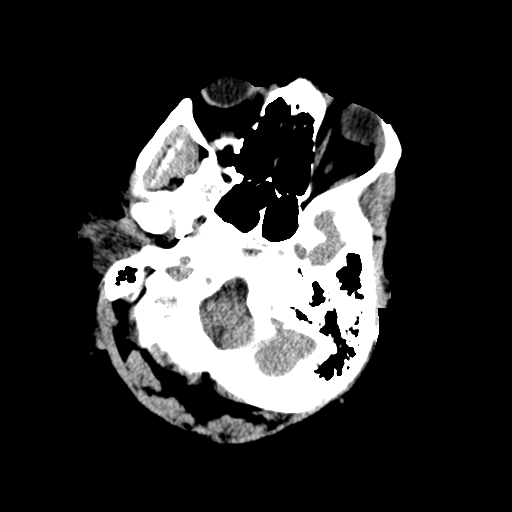
[im 9/30  brain]
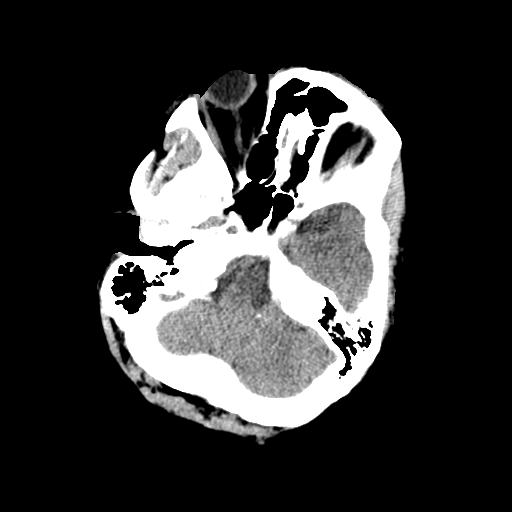
[im 11/30  brain]
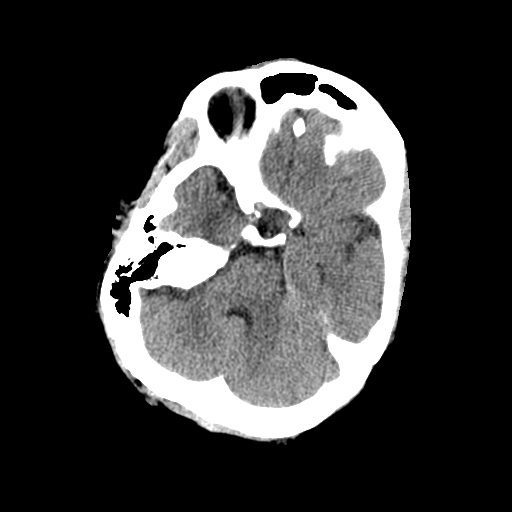
[im 11/30  bone]
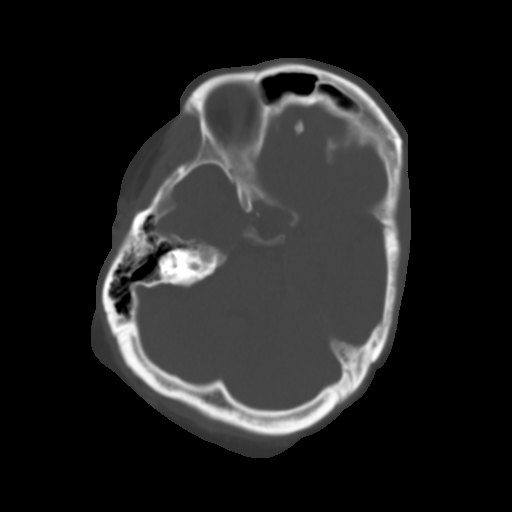
[im 13/30  brain]
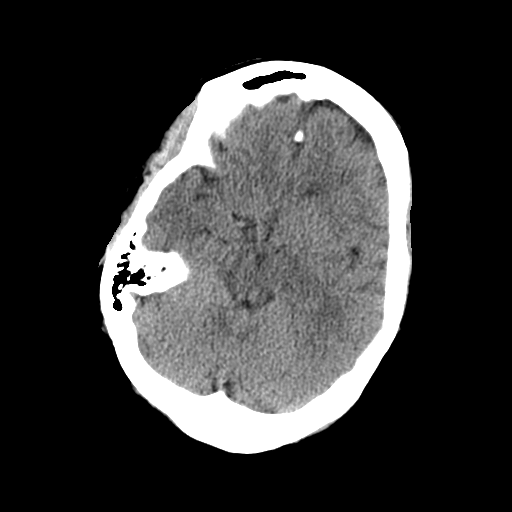
[im 15/30  brain]
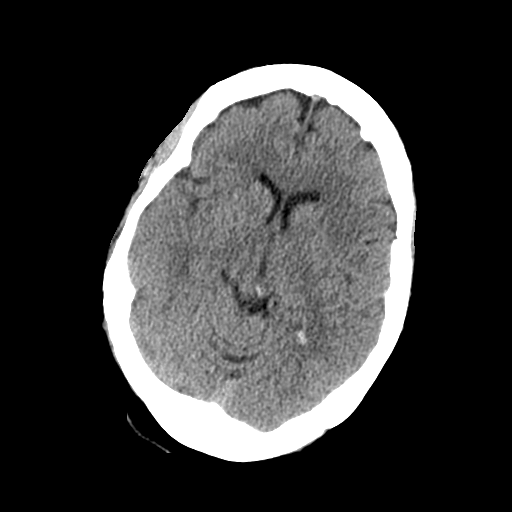
[im 17/30  brain]
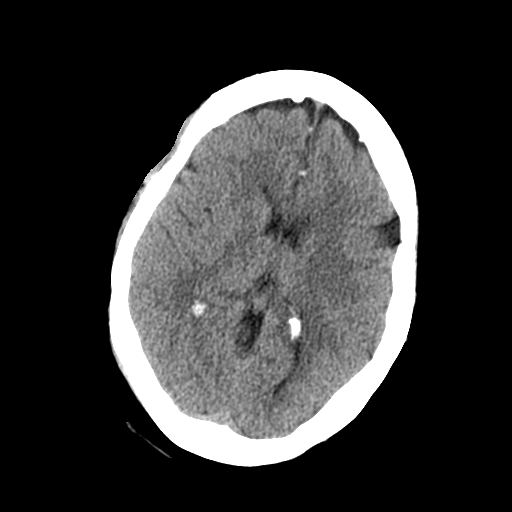
[im 19/30  brain]
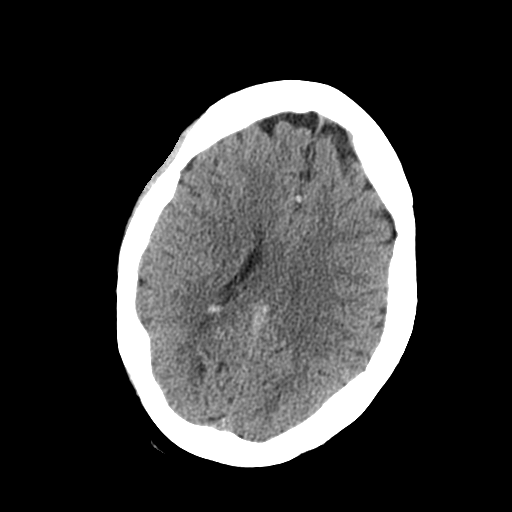
[im 19/30  bone]
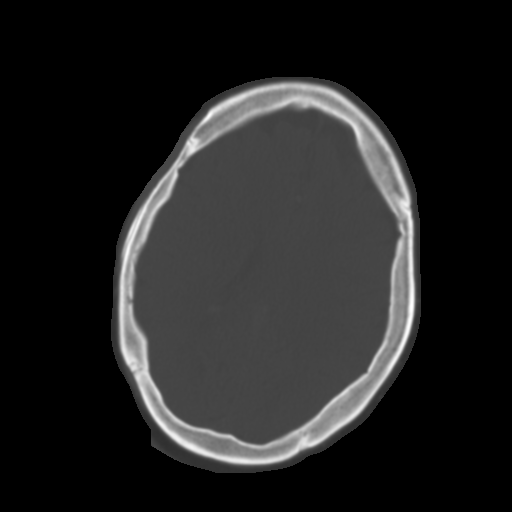
[im 21/30  brain]
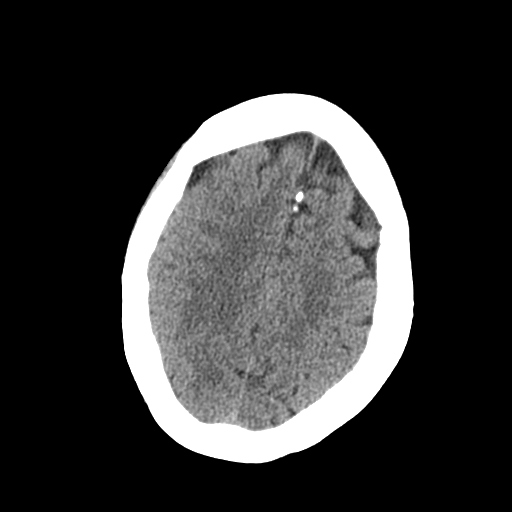
[im 23/30  brain]
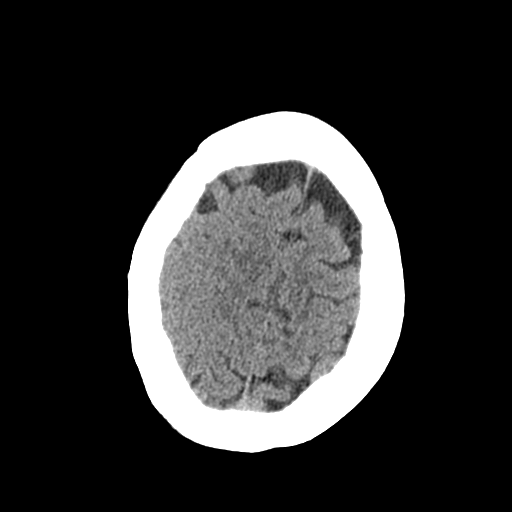
[im 25/30  brain]
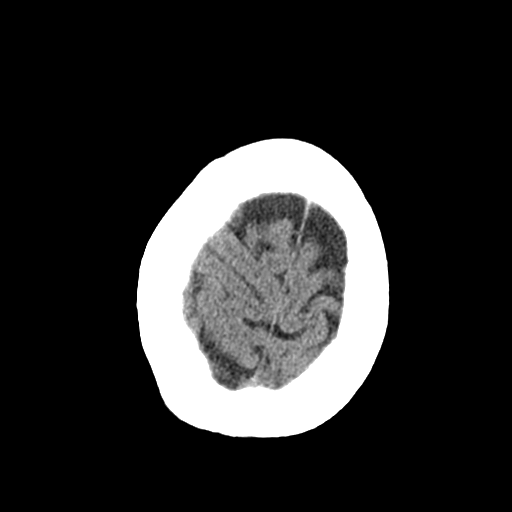
[im 27/30  brain]
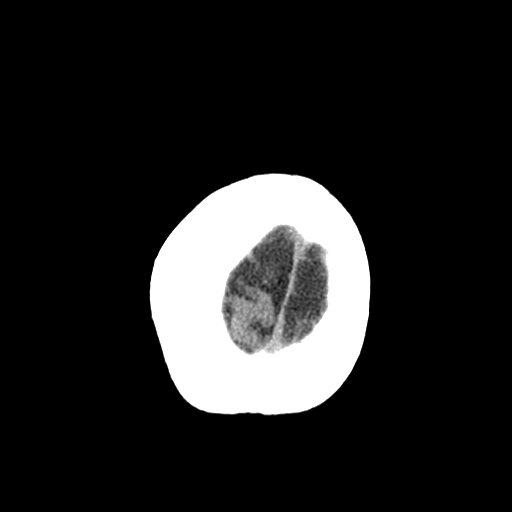
[im 27/30  bone]
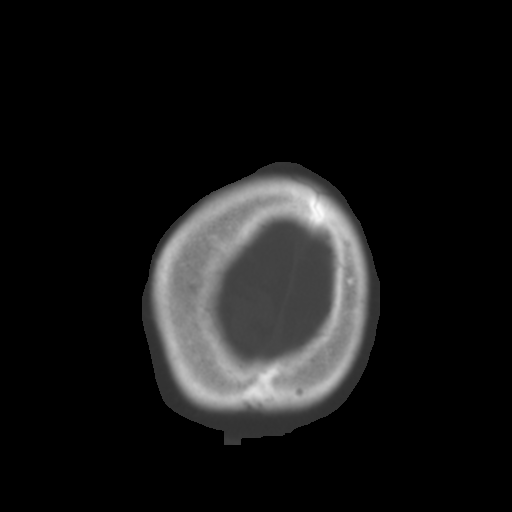

[Series 3: bone windows · axial · 0.45mm/px · z∈[-246,-206]mm · 3 of 30 slices shown]
[im 3/30  bone]
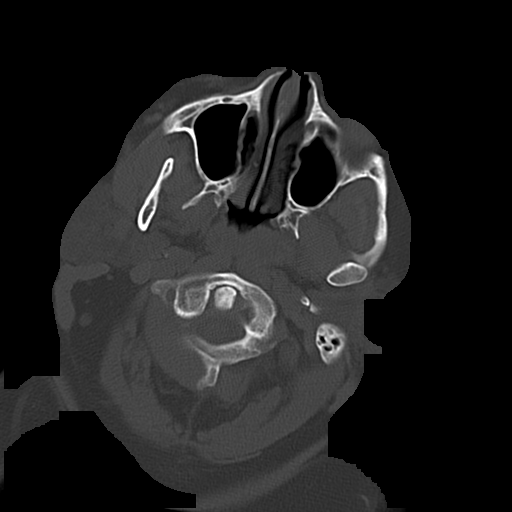
[im 7/30  bone]
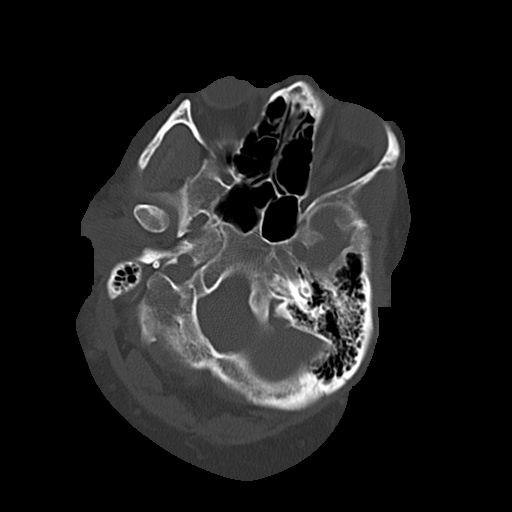
[im 11/30  bone]
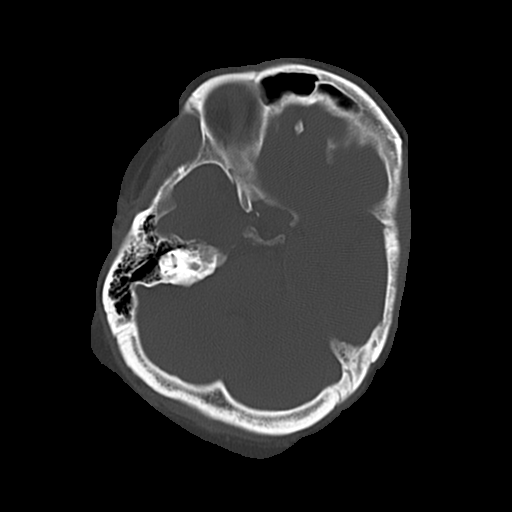

[16 of 30 positions shown; findings below may reference images not displayed]

FINDINGS: The ventricles are normal in size and configuration. There is no
intracranial mass, hemorrhage, extra-axial fluid collection, or
midline shift. Gray-white compartments appear normal. No acute
infarct evident. The bony calvarium appears intact. The mastoid air
cells are clear. No intraorbital lesions are identified.
IMPRESSION: Study within normal limits.

## 2018-01-11 ENCOUNTER — Ambulatory Visit: Payer: Medicaid Other

## 2018-01-12 ENCOUNTER — Emergency Department (HOSPITAL_COMMUNITY)
Admission: EM | Admit: 2018-01-12 | Discharge: 2018-01-12 | Disposition: A | Discharge status: Home / Self Care | Payer: Medicaid Other | Attending: Emergency Medicine | Admitting: Emergency Medicine

## 2018-01-12 ENCOUNTER — Emergency Department (HOSPITAL_COMMUNITY): Payer: Medicaid Other

## 2018-01-12 ENCOUNTER — Encounter (HOSPITAL_COMMUNITY): Payer: Self-pay

## 2018-01-12 ENCOUNTER — Other Ambulatory Visit: Payer: Self-pay

## 2018-01-12 DIAGNOSIS — J449 Chronic obstructive pulmonary disease, unspecified: Secondary | ICD-10-CM | POA: Diagnosis not present

## 2018-01-12 DIAGNOSIS — Z7984 Long term (current) use of oral hypoglycemic drugs: Secondary | ICD-10-CM | POA: Insufficient documentation

## 2018-01-12 DIAGNOSIS — J40 Bronchitis, not specified as acute or chronic: Secondary | ICD-10-CM

## 2018-01-12 DIAGNOSIS — I5042 Chronic combined systolic (congestive) and diastolic (congestive) heart failure: Secondary | ICD-10-CM | POA: Diagnosis not present

## 2018-01-12 DIAGNOSIS — Z79899 Other long term (current) drug therapy: Secondary | ICD-10-CM | POA: Insufficient documentation

## 2018-01-12 DIAGNOSIS — Z9981 Dependence on supplemental oxygen: Secondary | ICD-10-CM | POA: Diagnosis not present

## 2018-01-12 DIAGNOSIS — E119 Type 2 diabetes mellitus without complications: Secondary | ICD-10-CM | POA: Insufficient documentation

## 2018-01-12 DIAGNOSIS — I110 Hypertensive heart disease with heart failure: Secondary | ICD-10-CM | POA: Insufficient documentation

## 2018-01-12 DIAGNOSIS — Z85850 Personal history of malignant neoplasm of thyroid: Secondary | ICD-10-CM | POA: Insufficient documentation

## 2018-01-12 DIAGNOSIS — Z87891 Personal history of nicotine dependence: Secondary | ICD-10-CM | POA: Diagnosis not present

## 2018-01-12 DIAGNOSIS — R0602 Shortness of breath: Secondary | ICD-10-CM | POA: Diagnosis present

## 2018-01-12 LAB — BASIC METABOLIC PANEL
ANION GAP: 11 (ref 5–15)
BUN: 13 mg/dL (ref 6–20)
CALCIUM: 9.4 mg/dL (ref 8.9–10.3)
CO2: 27 mmol/L (ref 22–32)
Chloride: 99 mmol/L (ref 98–111)
Creatinine, Ser: 0.63 mg/dL (ref 0.44–1.00)
Glucose, Bld: 118 mg/dL — ABNORMAL HIGH (ref 70–99)
Potassium: 4.4 mmol/L (ref 3.5–5.1)
Sodium: 137 mmol/L (ref 135–145)

## 2018-01-12 LAB — CBC
HCT: 41 % (ref 36.0–46.0)
Hemoglobin: 12.3 g/dL (ref 12.0–15.0)
MCH: 27.7 pg (ref 26.0–34.0)
MCHC: 30 g/dL (ref 30.0–36.0)
MCV: 92.3 fL (ref 78.0–100.0)
Platelets: 256 10*3/uL (ref 150–400)
RBC: 4.44 MIL/uL (ref 3.87–5.11)
RDW: 14.1 % (ref 11.5–15.5)
WBC: 15.1 10*3/uL — ABNORMAL HIGH (ref 4.0–10.5)

## 2018-01-12 LAB — I-STAT TROPONIN, ED: TROPONIN I, POC: 0 ng/mL (ref 0.00–0.08)

## 2018-01-12 LAB — I-STAT BETA HCG BLOOD, ED (MC, WL, AP ONLY): I-stat hCG, quantitative: <5 m[IU]/mL (ref <5)

## 2018-01-12 MED ORDER — IPRATROPIUM-ALBUTEROL 0.5-2.5 (3) MG/3ML IN SOLN
3.0000 mL | Freq: Once | RESPIRATORY_TRACT | Status: AC
  Administered 2018-01-12: 3 mL via RESPIRATORY_TRACT
  Filled 2018-01-12: qty 3

## 2018-01-12 MED ORDER — CLONAZEPAM 0.5 MG PO TABS
2.0000 mg | ORAL_TABLET | Freq: Three times a day (TID) | ORAL | Status: DC
  Administered 2018-01-12: 2 mg via ORAL
  Filled 2018-01-12: qty 4

## 2018-01-12 MED ORDER — DOXYCYCLINE HYCLATE 100 MG PO CAPS: 100.0000 mg | ORAL_CAPSULE | Freq: Two times a day (BID) | ORAL | 0 refills | Status: DC

## 2018-01-12 MED ORDER — PREDNISONE 50 MG PO TABS: 50.0000 mg | ORAL_TABLET | Freq: Every day | ORAL | 0 refills | Status: DC

## 2018-01-12 MED ORDER — HYDROCODONE-ACETAMINOPHEN 5-325 MG PO TABS
1.0000 | ORAL_TABLET | Freq: Once | ORAL | Status: AC
  Administered 2018-01-12: 1 via ORAL
  Filled 2018-01-12: qty 1
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
## 2018-01-12 NOTE — Discharge Instructions (Addendum)
You will need to follow-up with your doctor.  Return here as needed.  Your testing did not show any significant abnormality.
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
## 2018-01-16 ENCOUNTER — Encounter (HOSPITAL_COMMUNITY): Payer: Medicaid Other

## 2018-01-17 ENCOUNTER — Ambulatory Visit
Admission: RE | Admit: 2018-01-17 | Discharge: 2018-01-17 | Disposition: A | Discharge status: Home / Self Care | Payer: Medicaid Other | Source: Ambulatory Visit | Attending: Internal Medicine | Admitting: Internal Medicine

## 2018-01-17 DIAGNOSIS — Z1231 Encounter for screening mammogram for malignant neoplasm of breast: Secondary | ICD-10-CM

## 2018-01-21 ENCOUNTER — Encounter (HOSPITAL_COMMUNITY): Payer: Medicaid Other

## 2018-01-21 ENCOUNTER — Ambulatory Visit: Payer: Medicaid Other | Admitting: Pulmonary Disease

## 2018-01-21 ENCOUNTER — Encounter: Payer: Self-pay | Admitting: Pulmonary Disease

## 2018-01-21 VITALS — BP 148/80 | HR 101 | Ht 65.0 in | Wt 315.0 lb

## 2018-01-21 DIAGNOSIS — J9612 Chronic respiratory failure with hypercapnia: Secondary | ICD-10-CM

## 2018-01-21 DIAGNOSIS — Z01811 Encounter for preprocedural respiratory examination: Secondary | ICD-10-CM | POA: Diagnosis not present

## 2018-01-21 DIAGNOSIS — E662 Morbid (severe) obesity with alveolar hypoventilation: Secondary | ICD-10-CM | POA: Diagnosis not present

## 2018-01-21 DIAGNOSIS — J449 Chronic obstructive pulmonary disease, unspecified: Secondary | ICD-10-CM

## 2018-01-21 NOTE — Patient Instructions (Signed)
Will send information to your oral surgery regarding instructions for your surgery  Follow up in 1 year

## 2018-01-21 NOTE — Progress Notes (Signed)
Pulmonary, Critical Care, and Sleep Medicine  Chief Complaint  Patient presents with  . Follow-up    doing well but it needing clearance for surgery.    Constitutional:  BP (!) 148/80 (BP Location: Left Arm, Patient Position: Sitting, Cuff Size: Normal)   Pulse (!) 101   Ht '5\' 5"'  (1.651 m)   Wt (!) 315 lb (142.9 kg)   SpO2 95%   BMI 52.42 kg/m   Past Medical History:  Anxiety, OA, Chronic diastolic CHF, Depression, DM, thyroid cancer, GERD, Gout, HA, DVT, HTN, Pneumonia  Brief Summary:  Michele Owens is a 57 y.o. female former smoker with obesity hypoventilation syndrome and COPD.  She has an infection in her tooth.  She is being seen by oral surgeon to have dental extraction.  She stopped smoked 11 months ago after she had thyroids surgery.  Uses spiriva daily.  Uses albuterol as needed.  Not having cough, wheeze, sputum chest pain, or leg swelling.  Uses 2 liters oxygen 24/7.  Previous sleep study did not show sleep apnea.  BMET from 01/12/18 showed HCO3 was 27, and down from 33 in 10/22/17.   Physical Exam:   Appearance - wearing oxygen ENMT - clear nasal mucosa, midline nasal septum, no oral exudates, no LAN, trachea midline Respiratory - normal chest wall, normal respiratory effort, no accessory muscle use, no wheeze/rales CV - s1s2 regular rate and rhythm, no murmurs, no peripheral edema, radial pulses symmetric GI - soft, non tender, no masses Lymph - no adenopathy noted in neck and axillary areas MSK - normal muscle strength and tone, normal gait Ext - no cyanosis, clubbing, or joint inflammation noted Skin - no rashes, lesions, or ulcers Neuro - oriented to person, place, and time Psych - normal mood and affect   Assessment/Plan:   COPD. - continue spiriva and prn albuterol - will need to get PFTs at some point  - congratulated her on smoking cessation  Obesity hypoventilation syndrome. - continue 2 liters oxygen 24/7  Morbid obesity. -  discussed importance of weight loss  Preoperative respiratory assessment prior to having dental extraction. - she is at low risk for perioperative pulmonary complications - main concern relates to maintaining her airway, and monitoring her oxygen level in perioperative period - procedure should be done in a facility that has appropriate monitoring equipment and airway support team if needed   Patient Instructions  Will send information to your oral surgery regarding instructions for your surgery  Follow up in 1 year    Chesley Mires, MD [facility] Pager: [phone] 01/21/2018, 1:02 PM  Flow Sheet     Pulmonary tests:    Sleep tests:  ABG from 12/07/14 >> pH 7.36, PaCO2 52.4, PaO2 66. PSG 08/24/15 >> AHI 3.6, SpO2 low 80%.  Spent 13.6 min with SpO2 < 88%.  Needed 2 liters supplemental oxygen.  Cardiac tests:  Myocardial perfusion scan 06/03/15 >> partially reversible inferior lateral defect Echo 08/05/17 >> EF 60 to 65%, grade 1 DD, mild MR  Medications:   Allergies as of 01/21/2018      Reactions   Bee Venom Swelling, Other (See Comments)   "All over my body" (swelling)   Fioricet [butalbital-apap-caffeine] Nausea And Vomiting, Rash   Ibuprofen Rash, Other (See Comments)   Severe rash   Lamisil [terbinafine] Rash, Other (See Comments)   Pt states this causes her to "feel funny"   Nsaids Other (See Comments)   Per MD's orders  Medication List        Accurate as of 01/21/18  1:02 PM. Always use your most recent med list.          ACCU-CHEK AVIVA PLUS w/Device Kit USE AS DIRECTED TO CHECK BLOOD SUGAR   albuterol 108 (90 Base) MCG/ACT inhaler Commonly known as:  PROVENTIL HFA;VENTOLIN HFA Inhale 1-2 puffs into the lungs every 6 (six) hours as needed for wheezing.   allopurinol 100 MG tablet Commonly known as:  ZYLOPRIM Take 100 mg by mouth daily.   amoxicillin 500 MG capsule Commonly known as:  AMOXIL amoxicillin 500 mg  capsule  take one tablet by mouth three times daily FOR 5 DAYS   calcitRIOL 0.5 MCG capsule Commonly known as:  ROCALTROL Take 1 capsule (0.5 mcg total) by mouth daily.   carvedilol 6.25 MG tablet Commonly known as:  COREG Take 1 tablet (6.25 mg total) by mouth 2 (two) times daily with a meal. Needs office visit   cetirizine 10 MG tablet Commonly known as:  ZYRTEC Take 10 mg by mouth daily.   clonazePAM 2 MG tablet Commonly known as:  KLONOPIN Take 2 mg by mouth 3 (three) times daily. for anxiety   diclofenac sodium 1 % Gel Commonly known as:  VOLTAREN Apply 2 g topically 4 (four) times daily as needed (for pain).   doxycycline 100 MG capsule Commonly known as:  VIBRAMYCIN Take 1 capsule (100 mg total) by mouth 2 (two) times daily.   EPINEPHrine 0.3 mg/0.3 mL Soaj injection Commonly known as:  EPI-PEN Inject 0.3 mg into the muscle daily as needed (allergic reaction).   FEROSUL 325 (65 FE) MG tablet Generic drug:  ferrous sulfate Take 325 mg by mouth daily.   glucose blood test strip Use as instructed to check blood sugar up to 4 times daily   ipratropium-albuterol 0.5-2.5 (3) MG/3ML Soln Commonly known as:  DUONEB Use one vial in Nebulizer four times daily as directed.   levothyroxine 112 MCG tablet Commonly known as:  SYNTHROID, LEVOTHROID Take 224 mcg by mouth daily.   lisinopril 5 MG tablet Commonly known as:  PRINIVIL,ZESTRIL Take 1 tablet (5 mg total) by mouth daily. Please call for office visit [phone]   loperamide 2 MG capsule Commonly known as:  IMODIUM Take 1 capsule (2 mg total) by mouth 4 (four) times daily as needed for diarrhea or loose stools.   metFORMIN 500 MG tablet Commonly known as:  GLUCOPHAGE Take 1 tablet (500 mg total) by mouth 2 (two) times daily with a meal.   metoCLOPramide 10 MG tablet Commonly known as:  REGLAN Take 1 tablet (10 mg total) by mouth every 6 (six) hours as needed for nausea (or headache).   metoprolol  succinate 25 MG 24 hr tablet Commonly known as:  TOPROL-XL Take 1 tablet (25 mg total) by mouth daily.   nystatin cream Commonly known as:  MYCOSTATIN APPLY TO THE AFFECTED AREA(S) TWICE DAILY AS NEEDED yeast   omeprazole 40 MG capsule Commonly known as:  PRILOSEC Take 40 mg by mouth daily.   potassium chloride SA 20 MEQ tablet Commonly known as:  K-DUR,KLOR-CON Take 2 tablets (40 mEq total) by mouth daily.   pravastatin 40 MG tablet Commonly known as:  PRAVACHOL Take 40 mg by mouth every evening.   QC STOOL SOFTENER PLS LAXATIVE 8.6-50 MG tablet Generic drug:  senna-docusate Take 1 tablet by mouth at bedtime.   spironolactone 100 MG tablet Commonly known as:  ALDACTONE Take 100 mg  by mouth daily.   tiotropium 18 MCG inhalation capsule Commonly known as:  SPIRIVA Place 18 mcg into inhaler and inhale daily.       Past Surgical History:  She  has a past surgical history that includes Bunionectomy (Bilateral); Tonsillectomy; Total abdominal hysterectomy (07/14/10); Cardiac catheterization (N/A, 06/23/2015); Colonoscopy with propofol (N/A, 12/15/2015); Thyroidectomy (09/19/2016); and Thyroidectomy (N/A, 09/19/2016).  Family History:  Her family history includes Brain cancer in her paternal aunt; Cancer in her cousin, cousin, father, and maternal uncle; Diabetes in her mother; Thyroid cancer in her mother.  Social History:  She  reports that she quit smoking about 18 months ago. Her smoking use included cigarettes. She has a 20.50 pack-year smoking history. She has never used smokeless tobacco. She reports that she drinks alcohol. She reports that she has current or past drug history. Drug: Marijuana.

## 2018-01-22 ENCOUNTER — Telehealth: Payer: Self-pay | Admitting: Pulmonary Disease

## 2018-01-22 NOTE — Telephone Encounter (Signed)
Patient Instructions from [place] 01/21/18  Will send information to your oral surgery regarding instructions for your surgery   Called and spoke with pt stating to her that [facility] had said we would send information to the oral surgeon as stated at her [place] with [facility] 10/14.  When I was trying to talk to pt, she was very unhappy and frustrated and stated that she did not receive any paperwork back and demanded we send the information to the oral surgeon so she can get the surgery done that she needs.  I stated to pt I would send this over to Dr. Halford Chessman for him to view to see if the information had been sent to the oral surgeon for the surgical clearance.  Pt expressed understanding.  Dr. Halford Chessman, please advise on this for pt. Thanks!

## 2018-01-22 NOTE — Telephone Encounter (Signed)
The oral surgeon was supposed to fax over a form for me to complete.  In the meantime, my office note from 01/21/18 can be faxed to oral surgeons office.

## 2018-01-23 ENCOUNTER — Other Ambulatory Visit (HOSPITAL_COMMUNITY): Payer: Self-pay | Admitting: *Deleted

## 2018-01-23 MED ORDER — CARVEDILOL 6.25 MG PO TABS: 6.2500 mg | ORAL_TABLET | Freq: Two times a day (BID) | ORAL | 0 refills | Status: DC

## 2018-01-23 NOTE — Telephone Encounter (Signed)
Spoke with pt. She is aware of Dr. Juanetta Gosling response. OV note has been faxed to [facility] at [phone]. Nothing further was needed at this time.

## 2018-01-24 ENCOUNTER — Telehealth: Payer: Self-pay | Admitting: Pulmonary Disease

## 2018-01-24 NOTE — Telephone Encounter (Signed)
Surgical clearance form completed and in my office.  Please fax back to [place].

## 2018-01-25 NOTE — Telephone Encounter (Signed)
The forms are signed by VS, and faxed over to the surgical center. Nothing further needed.

## 2018-01-27 ENCOUNTER — Emergency Department (HOSPITAL_COMMUNITY): Payer: Medicaid Other

## 2018-01-27 ENCOUNTER — Other Ambulatory Visit: Payer: Self-pay

## 2018-01-27 ENCOUNTER — Emergency Department (HOSPITAL_COMMUNITY)
Admission: EM | Admit: 2018-01-27 | Discharge: 2018-01-28 | Disposition: A | Discharge status: Home / Self Care | Payer: Medicaid Other | Attending: Emergency Medicine | Admitting: Emergency Medicine

## 2018-01-27 DIAGNOSIS — J449 Chronic obstructive pulmonary disease, unspecified: Secondary | ICD-10-CM | POA: Diagnosis not present

## 2018-01-27 DIAGNOSIS — R0602 Shortness of breath: Secondary | ICD-10-CM | POA: Insufficient documentation

## 2018-01-27 DIAGNOSIS — E119 Type 2 diabetes mellitus without complications: Secondary | ICD-10-CM | POA: Diagnosis not present

## 2018-01-27 DIAGNOSIS — Z79899 Other long term (current) drug therapy: Secondary | ICD-10-CM | POA: Insufficient documentation

## 2018-01-27 DIAGNOSIS — I110 Hypertensive heart disease with heart failure: Secondary | ICD-10-CM | POA: Insufficient documentation

## 2018-01-27 DIAGNOSIS — R079 Chest pain, unspecified: Secondary | ICD-10-CM | POA: Diagnosis not present

## 2018-01-27 DIAGNOSIS — I5032 Chronic diastolic (congestive) heart failure: Secondary | ICD-10-CM | POA: Diagnosis not present

## 2018-01-27 DIAGNOSIS — Z87891 Personal history of nicotine dependence: Secondary | ICD-10-CM | POA: Insufficient documentation

## 2018-01-27 LAB — BASIC METABOLIC PANEL
Anion gap: 10 (ref 5–15)
BUN: 13 mg/dL (ref 6–20)
CHLORIDE: 99 mmol/L (ref 98–111)
CO2: 29 mmol/L (ref 22–32)
CREATININE: 0.59 mg/dL (ref 0.44–1.00)
Calcium: 9.1 mg/dL (ref 8.9–10.3)
GFR calc Af Amer: >60 mL/min (ref >60)
GFR calc non Af Amer: >60 mL/min (ref >60)
GLUCOSE: 136 mg/dL — AB (ref 70–99)
POTASSIUM: 4.1 mmol/L (ref 3.5–5.1)
SODIUM: 138 mmol/L (ref 135–145)

## 2018-01-27 LAB — CBC
HEMATOCRIT: 40.3 % (ref 36.0–46.0)
HEMOGLOBIN: 11.8 g/dL — AB (ref 12.0–15.0)
MCH: 27.4 pg (ref 26.0–34.0)
MCHC: 29.3 g/dL — AB (ref 30.0–36.0)
MCV: 93.7 fL (ref 80.0–100.0)
Platelets: 256 10*3/uL (ref 150–400)
RBC: 4.3 MIL/uL (ref 3.87–5.11)
RDW: 14.8 % (ref 11.5–15.5)
WBC: 13.8 10*3/uL — AB (ref 4.0–10.5)
nRBC: 0 % (ref 0.0–0.2)

## 2018-01-27 LAB — I-STAT TROPONIN, ED: Troponin i, poc: 0 ng/mL (ref 0.00–0.08)

## 2018-01-27 MED ORDER — IPRATROPIUM-ALBUTEROL 0.5-2.5 (3) MG/3ML IN SOLN
3.0000 mL | Freq: Once | RESPIRATORY_TRACT | Status: AC
  Administered 2018-01-27: 3 mL via RESPIRATORY_TRACT
  Filled 2018-01-27: qty 3

## 2018-01-27 NOTE — ED Provider Notes (Signed)
[facility] EMERGENCY DEPARTMENT Provider Note   CSN: [id number] Arrival date & time: 01/27/18  2213    History   Chief Complaint Chief Complaint  Patient presents with  . Shortness of Breath    HPI Michele Owens is a 57 y.o. female.  57 year old female with multiple chronic comorbidities including chronic diastolic CHF, diabetes, hypertension, COPD on chronic 2L O2 via nasal cannula who presents to the emergency department for multiple complaints.  She is primarily complaining of leg swelling bilaterally as well as chest pain.  She states that the symptoms have been present for 3 days.  She notes her chest pain to be central, nonradiating, and sharp.  Pain is intermittent and without modifying factors.  She denies taking any medications for her symptoms.  She states that her lower extremity swelling has been present for the same amount of time.  She has been compliant with her daily medications including her Aldactone.  States that she has been feeling short of breath for a greater length of time.  Reports mentioning this to her pulmonologist 6 days ago at her outpatient appointment, though she cannot recall what they advised her to do for this.  States that she gets short of breath if she is off of her oxygen for any length of time, but has been advised to use this 24/7. No recent fevers.  Hx of right/left heart catheterization in 2017 showing no significant CAD. Last echo in April 2019 with LVEF of 60-65%.     Past Medical History:  Diagnosis Date  . Anxiety   . Arthritis   . Asthma   . Chronic diastolic CHF (congestive heart failure) ([place])   . COPD (chronic obstructive pulmonary disease) (HCC)    Uses Oxygen at night  . Depression   . Diabetes mellitus without complication ([place])   . Dyspnea    occ  . Family history of thyroid cancer   . GERD (gastroesophageal reflux disease)   . Gout   . Headache    migraines  . History of DVT of lower extremity   .  History of nuclear stress test    Myoview 2/17:  Low risk stress nuclear study with a small, moderate intensity, partially reversible inferior lateral defect consistent with small prior infarct and minimal peri-infarct ischemia; EF 68 with normal wall motion  . Hypertension   . Pneumonia   . Thyroid cancer ([place]) 09/23/2016    Patient Active Problem List   Diagnosis Date Noted  . Hypertensive disorder 08/28/2017  . Obesity 08/28/2017  . COPD exacerbation ([place]) 08/04/2017  . Acute and chronic respiratory failure with hypercapnia ([place])   . Acute on chronic diastolic heart failure ([place])   . Pulmonary HTN ([place])   . Genetic testing 02/27/2017  . Family history of thyroid cancer   . Hypomagnesemia 10/03/2016  . Boil, breast 10/03/2016  . Atypical chest pain   . HLD (hyperlipidemia) 09/23/2016  . COPD (chronic obstructive pulmonary disease) ([place]) 09/23/2016  . Gout 09/23/2016  . DVT (deep vein thrombosis) in pregnancy 09/23/2016  . Thyroid cancer ([place]) 09/23/2016  . Hypocalcemia 09/23/2016  . AKI (acute kidney injury) ([place]) 09/23/2016  . 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 Acute pulmonary edema (HCC)   . Acute hypoxemic respiratory failure ([place])   . Acute on chronic respiratory failure with hypoxemia ([place])   . Ventilator dependent ([place])   . Respiratory failure ([place]) 07/16/2016  . Chronic combined systolic (congestive) and diastolic (congestive)  heart failure ([place]) 02/10/2016  . Dyspnea   . CAP (community acquired pneumonia) 01/23/2016  . 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 Pulmonary hypertension ([place]) 01/23/2016  . Sepsis ([place]) 01/23/2016  . Obesity hypoventilation syndrome ([place]) 08/26/2015  . Chest pain 06/16/2015  . Dyslipidemia associated with type 2 diabetes mellitus ([place]) 04/24/2015  . Leukocytosis 04/24/2015  . Controlled type 2 diabetes mellitus without complication, without long-term current use of insulin ([place])  04/24/2015  . Anxiety 04/24/2015  . Benign essential HTN 04/24/2015  . Normocytic anemia 04/24/2015  . Asthma with COPD with exacerbation ([place]) 04/24/2015  . OSA (obstructive sleep apnea) 05/10/2012  . Tobacco abuse 07/04/2011    Past Surgical History:  Procedure Laterality Date  . BUNIONECTOMY Bilateral   . CARDIAC CATHETERIZATION N/A 06/23/2015   Procedure: Right/Left Heart Cath and Coronary Angiography;  Surgeon: [name], MD;  Location: [facility];  Service: Cardiovascular;  Laterality: N/A;  . COLONOSCOPY WITH PROPOFOL N/A 12/15/2015   Procedure: COLONOSCOPY WITH PROPOFOL;  Surgeon: [name], MD;  Location: [facility];  Service: Endoscopy;  Laterality: N/A;  . THYROIDECTOMY  09/19/2016  . THYROIDECTOMY N/A 09/19/2016   Procedure: TOTAL THYROIDECTOMY;  Surgeon: [name], MD;  Location: [facility];  Service: General;  Laterality: N/A;  . TONSILLECTOMY    . TOTAL ABDOMINAL HYSTERECTOMY  07/14/10     OB History   None      Home Medications    Prior to Admission medications   Medication Sig Start Date End Date Taking? Authorizing Provider  albuterol (PROVENTIL HFA;VENTOLIN HFA) 108 (90 BASE) MCG/ACT inhaler Inhale 1-2 puffs into the lungs every 6 (six) hours as needed for wheezing. Patient taking differently: Inhale 1 puff into the lungs 2 (two) times daily.  04/09/12   Billy Fischer, MD  allopurinol (ZYLOPRIM) 100 MG tablet Take 100 mg by mouth daily.    [provider]  amoxicillin (AMOXIL) 500 MG capsule amoxicillin 500 mg capsule  take one tablet by mouth three times daily FOR 5 DAYS    [provider]  Blood Glucose Monitoring Suppl (ACCU-CHEK AVIVA PLUS) w/Device KIT USE AS DIRECTED TO CHECK BLOOD SUGAR 08/07/17   [provider]  calcitRIOL (ROCALTROL) 0.5 MCG capsule Take 1 capsule (0.5 mcg total) by mouth daily. 09/26/16   Hongalgi, [name], MD  carvedilol (COREG) 6.25 MG tablet Take 1 tablet (6.25 mg total) by mouth 2 (two) times  daily with a meal. 01/23/18   Bensimhon, [name], MD  cetirizine (ZYRTEC) 10 MG tablet Take 10 mg by mouth daily. 07/10/17   [provider]  clonazePAM (KLONOPIN) 2 MG tablet Take 2 mg by mouth 3 (three) times daily. for anxiety 05/08/17   [provider]  diclofenac sodium (VOLTAREN) 1 % GEL Apply 2 g topically 4 (four) times daily as needed (for pain).     [provider]  doxycycline (VIBRAMYCIN) 100 MG capsule Take 1 capsule (100 mg total) by mouth 2 (two) times daily. 01/12/18   Lawyer, Harrell Gave, PA-C  EPINEPHrine 0.3 mg/0.3 mL IJ SOAJ injection Inject 0.3 mg into the muscle daily as needed (allergic reaction).    [provider]  FEROSUL 325 (65 Fe) MG tablet Take 325 mg by mouth daily. 03/22/17   [provider]  glucose blood test strip Use as instructed to check blood sugar up to 4 times daily 08/06/17   [name], DO  ipratropium-albuterol (DUONEB) 0.5-2.5 (3) MG/3ML SOLN Use  one vial in Nebulizer four times daily as directed. 04/05/17   [provider]  levothyroxine (SYNTHROID, LEVOTHROID) 112 MCG tablet Take 224 mcg by mouth daily. 07/11/17   [provider]  lisinopril (PRINIVIL,ZESTRIL) 5 MG tablet Take 1 tablet (5 mg total) by mouth daily. Please call for office visit [phone] 08/15/17   [name], MD  loperamide (IMODIUM) 2 MG capsule Take 1 capsule (2 mg total) by mouth 4 (four) times daily as needed for diarrhea or loose stools. 7/79/39   [name], MD  metFORMIN (GLUCOPHAGE) 500 MG tablet Take 1 tablet (500 mg total) by mouth 2 (two) times daily with a meal. 12/11/14   [name], MD  metoCLOPramide (REGLAN) 10 MG tablet Take 1 tablet (10 mg total) by mouth every 6 (six) hours as needed for nausea (or headache). 0/30/09   [name], MD  metoprolol succinate (TOPROL-XL) 25 MG 24 hr tablet Take 1 tablet (25 mg total) by mouth daily. 08/07/17   [name], DO  nystatin cream (MYCOSTATIN) APPLY  TO THE AFFECTED AREA(S) TWICE DAILY AS NEEDED yeast 04/13/17   [provider]  omeprazole (PRILOSEC) 40 MG capsule Take 40 mg by mouth daily. 04/13/15   [provider]  potassium chloride SA (K-DUR,KLOR-CON) 20 MEQ tablet Take 2 tablets (40 mEq total) by mouth daily. Patient taking differently: Take 20 mEq by mouth daily.  08/01/16   [name], MD  pravastatin (PRAVACHOL) 40 MG tablet Take 40 mg by mouth every evening.  03/29/15   [provider]  QC STOOL SOFTENER PLS LAXATIVE 8.6-50 MG tablet Take 1 tablet by mouth at bedtime. 04/11/17   [provider]  spironolactone (ALDACTONE) 100 MG tablet Take 100 mg by mouth daily. 10/02/17   [provider]  tiotropium (SPIRIVA) 18 MCG inhalation capsule Place 18 mcg into inhaler and inhale daily.    [provider]    Family History Family History  Problem Relation Age of Onset  . Cancer Father        thought to be due to exposure to concrete  . Diabetes Mother   . Thyroid cancer Mother        dx in her 28s-60s  . Cancer Maternal Uncle        2 uncles with cancer NOS  . Brain cancer Paternal Aunt   . Cancer Cousin        maternal first cousin - NOS  . Cancer Cousin        maternal first cousin - NOS    Social History Social History   Tobacco Use  . Smoking status: Former Smoker    Packs/day: 0.50    Years: 41.00    Pack years: 20.50    Types: Cigarettes    Last attempt to quit: 07/2016    Years since quitting: 1.5  . Smokeless tobacco: Never Used  . Tobacco comment: Starting to Wean Off -- using Nicotine Patch  Substance Use Topics  . Alcohol use: Yes    Alcohol/week: 0.0 standard drinks    Comment: ? none now  . Drug use: Yes    Types: Marijuana    Comment: none for long time     Allergies   Bee venom; Fioricet [butalbital-apap-caffeine]; Ibuprofen; Lamisil [terbinafine]; and Nsaids   Review of Systems Review of Systems Ten systems reviewed and are negative for  acute change, except as noted in the HPI.    Physical Exam Updated Vital Signs BP 120/71 (  BP Location: Left Arm)   Pulse 82   Temp 98.1 F (36.7 C) (Oral)   Resp (!) 24   Ht '5\' 5"'  (1.651 m)   Wt (!) 141.5 kg   SpO2 95%   BMI 51.92 kg/m   Physical Exam  Constitutional: She is oriented to person, place, and time. She appears well-developed and well-nourished. No distress.  Supermorbidly obese. Nontoxic.  HENT:  Head: Normocephalic and atraumatic.  Eyes: Conjunctivae and EOM are normal. No scleral icterus.  Neck: Normal range of motion.  Pulmonary/Chest: Effort normal. No respiratory distress. She has no rales.  Faint expiratory wheeze. Prolonged expiratory phase. No rales or rhonchi. SpO2 93-95% on chronic 2L via nasal cannula.  Musculoskeletal: Normal range of motion.  1+ pitting edema in BLE  Neurological: She is alert and oriented to person, place, and time. She exhibits normal muscle tone. Coordination normal.  Skin: Skin is warm and dry. No rash noted. She is not diaphoretic. No erythema. No pallor.  Psychiatric: She has a normal mood and affect. Her behavior is normal.  Nursing note and vitals reviewed.    ED Treatments / Results  Labs (all labs ordered are listed, but only abnormal results are displayed) Labs Reviewed  BASIC METABOLIC PANEL - Abnormal; Notable for the following components:      Result Value   Glucose, Bld 136 (*)    All other components within normal limits  CBC - Abnormal; Notable for the following components:   WBC 13.8 (*)    Hemoglobin 11.8 (*)    MCHC 29.3 (*)    All other components within normal limits  BRAIN NATRIURETIC PEPTIDE  I-STAT TROPONIN, ED  I-STAT TROPONIN, ED    EKG EKG Interpretation  Date/Time:  Monday January 28 2018 00:39:31 EDT Ventricular Rate:  83 PR Interval:    QRS Duration: 83 QT Interval:  382 QTC Calculation: 449 R Axis:   36 Text Interpretation:  Sinus rhythm Low voltage, precordial leads Borderline T  abnormalities, anterior leads Baseline wander in lead(s) V3 No significant change since last tracing Confirmed by Pryor Curia [phone]) on 01/28/2018 12:44:54 AM   Radiology Dg Chest 2 View  Result Date: 01/27/2018 CLINICAL DATA:  Dyspnea EXAM: CHEST - 2 VIEW COMPARISON:  01/12/2018 chest radiograph. FINDINGS: Stable cardiomediastinal silhouette with mild cardiomegaly. No pneumothorax. No pleural effusion. Mild pulmonary edema. Surgical clips overlie the medial lower neck bilaterally. IMPRESSION: Mild congestive heart failure. Electronically Signed   By: Ilona Sorrel M.D.   On: 01/27/2018 22:51    Procedures Procedures (including critical care time)  Medications Ordered in ED Medications  ipratropium-albuterol (DUONEB) 0.5-2.5 (3) MG/3ML nebulizer solution 3 mL (3 mLs Nebulization Given 01/27/18 2255)  furosemide (LASIX) injection 40 mg (40 mg Intravenous Given 01/28/18 0057)  oxyCODONE-acetaminophen (PERCOCET/ROXICET) 5-325 MG per tablet 1 tablet (1 tablet Oral Given 01/28/18 0118)  clonazePAM (KLONOPIN) tablet 2 mg (2 mg Oral Given 01/28/18 0232)     Initial Impression / Assessment and Plan / ED Course  I have reviewed the triage vital signs and the nursing notes.  Pertinent labs & imaging results that were available during my care of the patient were reviewed by me and considered in my medical decision making (see chart for details).     57 year old female presents to the emergency department for multiple complaints.  She notes chest pain which has been intermittent since yesterday with subjective lower extremity swelling.  She does have some mild pitting edema in bilateral lower extremities.  She further complains of shortness of breath, but states this has been going on for a while.  She endorses discussing the symptoms with her pulmonologist at her outpatient visit 6 days ago.  She cannot recall what was advised of her regarding this complaint.  Patient with history of COPD,  satting well on chronic home oxygen.  No increased work of breathing on exam.  The patient was given a DuoNeb in the ED as she was due for a nightly albuterol treatment.  Her work-up has been reassuring with negative troponin x2.  Her EKG is largely unchanged from prior.  I have a low suspicion that her chest pain today is secondary to ACS.  Patient with reassuring echocardiogram in April 2019.  Also no significant CAD seen on right/left heart catheterization in 2017.  Chest x-ray suggests mild congestive heart failure.  BNP remains normal as with prior work-ups.  She was given 1 dose of IV Lasix for symptomatic management.  No evidence of pneumothorax, pneumonia, mediastinal widening to suggest dissection.  Laboratory work-up reviewed which is consistent with baseline.  Her leukocytosis is chronic.  She has no electrolyte derangements.  Kidney function preserved.  I believe many of the patient's complaints today are chronic in nature.  I do not believe further emergent work-up is indicated.  She has been encouraged to follow-up with her pulmonologist as well as her primary care doctor.  Return precautions discussed and provided. Patient discharged in stable condition with no unaddressed concerns.   Final Clinical Impressions(s) / ED Diagnoses   Final diagnoses:  Chest pain, unspecified type  Shortness of breath    ED Discharge Orders    None       Antonietta Breach, PA-C 01/28/18 1552    Ward, Delice Bison, DO 01/28/18 0600

## 2018-01-27 NOTE — ED Triage Notes (Signed)
Patient c/o CP, SOB and bilateral lower extremity swelling that has been ongoing for several days.

## 2018-01-28 ENCOUNTER — Ambulatory Visit: Payer: Medicaid Other | Admitting: Podiatry

## 2018-01-28 LAB — I-STAT TROPONIN, ED: TROPONIN I, POC: 0 ng/mL (ref 0.00–0.08)

## 2018-01-28 LAB — BRAIN NATRIURETIC PEPTIDE: B Natriuretic Peptide: 30.7 pg/mL (ref 0.0–100.0)

## 2018-01-28 MED ORDER — OXYCODONE-ACETAMINOPHEN 5-325 MG PO TABS
1.0000 | ORAL_TABLET | Freq: Once | ORAL | Status: AC
  Administered 2018-01-28: 1 via ORAL
  Filled 2018-01-28: qty 1

## 2018-01-28 MED ORDER — FUROSEMIDE 10 MG/ML IJ SOLN
40.0000 mg | INTRAMUSCULAR | Status: AC
  Administered 2018-01-28: 40 mg via INTRAVENOUS
  Filled 2018-01-28: qty 4

## 2018-01-28 MED ORDER — CLONAZEPAM 0.5 MG PO TABS
2.0000 mg | ORAL_TABLET | Freq: Once | ORAL | Status: AC
  Administered 2018-01-28: 2 mg via ORAL
  Filled 2018-01-28: qty 4

## 2018-01-28 NOTE — ED Notes (Signed)
Pt requesting anxiety meds and cough meds, PA Humes notified at this time.

## 2018-01-28 NOTE — Discharge Instructions (Signed)
Continue your home oxygen as well as your daily prescribed medications.  We recommend close follow-up with your primary care doctor.  Call in the morning to schedule an appointment to be seen no later than early next week.  You may follow-up with your pulmonologist as needed.  Return to the ED for any new or concerning symptoms.

## 2018-01-28 NOTE — ED Notes (Signed)
Pt requesting pain meds, notified PA Humes. See new orders.

## 2018-01-28 NOTE — ED Notes (Signed)
ED Provider at bedside. 

## 2018-01-31 ENCOUNTER — Other Ambulatory Visit: Payer: Self-pay

## 2018-01-31 ENCOUNTER — Emergency Department (HOSPITAL_COMMUNITY): Payer: Medicaid Other

## 2018-01-31 ENCOUNTER — Ambulatory Visit: Payer: Medicaid Other | Admitting: Podiatry

## 2018-01-31 ENCOUNTER — Encounter (HOSPITAL_COMMUNITY): Payer: Self-pay

## 2018-01-31 ENCOUNTER — Emergency Department (HOSPITAL_COMMUNITY)
Admission: EM | Admit: 2018-01-31 | Discharge: 2018-02-01 | Disposition: A | Discharge status: Home / Self Care | Payer: Medicaid Other | Attending: Emergency Medicine | Admitting: Emergency Medicine

## 2018-01-31 DIAGNOSIS — Z87891 Personal history of nicotine dependence: Secondary | ICD-10-CM | POA: Diagnosis not present

## 2018-01-31 DIAGNOSIS — M79674 Pain in right toe(s): Secondary | ICD-10-CM | POA: Diagnosis not present

## 2018-01-31 DIAGNOSIS — E1142 Type 2 diabetes mellitus with diabetic polyneuropathy: Secondary | ICD-10-CM

## 2018-01-31 DIAGNOSIS — B351 Tinea unguium: Secondary | ICD-10-CM | POA: Diagnosis not present

## 2018-01-31 DIAGNOSIS — Z79899 Other long term (current) drug therapy: Secondary | ICD-10-CM | POA: Diagnosis not present

## 2018-01-31 DIAGNOSIS — R601 Generalized edema: Secondary | ICD-10-CM

## 2018-01-31 DIAGNOSIS — J449 Chronic obstructive pulmonary disease, unspecified: Secondary | ICD-10-CM | POA: Diagnosis not present

## 2018-01-31 DIAGNOSIS — T59811A Toxic effect of smoke, accidental (unintentional), initial encounter: Secondary | ICD-10-CM

## 2018-01-31 DIAGNOSIS — M79675 Pain in left toe(s): Secondary | ICD-10-CM

## 2018-01-31 DIAGNOSIS — I5032 Chronic diastolic (congestive) heart failure: Secondary | ICD-10-CM | POA: Diagnosis not present

## 2018-01-31 DIAGNOSIS — I110 Hypertensive heart disease with heart failure: Secondary | ICD-10-CM | POA: Insufficient documentation

## 2018-01-31 DIAGNOSIS — E119 Type 2 diabetes mellitus without complications: Secondary | ICD-10-CM | POA: Diagnosis not present

## 2018-01-31 DIAGNOSIS — R0602 Shortness of breath: Secondary | ICD-10-CM | POA: Diagnosis present

## 2018-01-31 DIAGNOSIS — J705 Respiratory conditions due to smoke inhalation: Secondary | ICD-10-CM

## 2018-01-31 LAB — I-STAT TROPONIN, ED: TROPONIN I, POC: 0 ng/mL (ref 0.00–0.08)

## 2018-01-31 IMAGING — CR DG CHEST 2V
2 series · 2 of 2 positions shown · non-contrast
Comparison: 04/24/2015

CLINICAL DATA: Shortness of breath, anterior chest pain, productive
cough. History of COPD

EXAM:
CHEST  2 VIEW

[w chest pa]
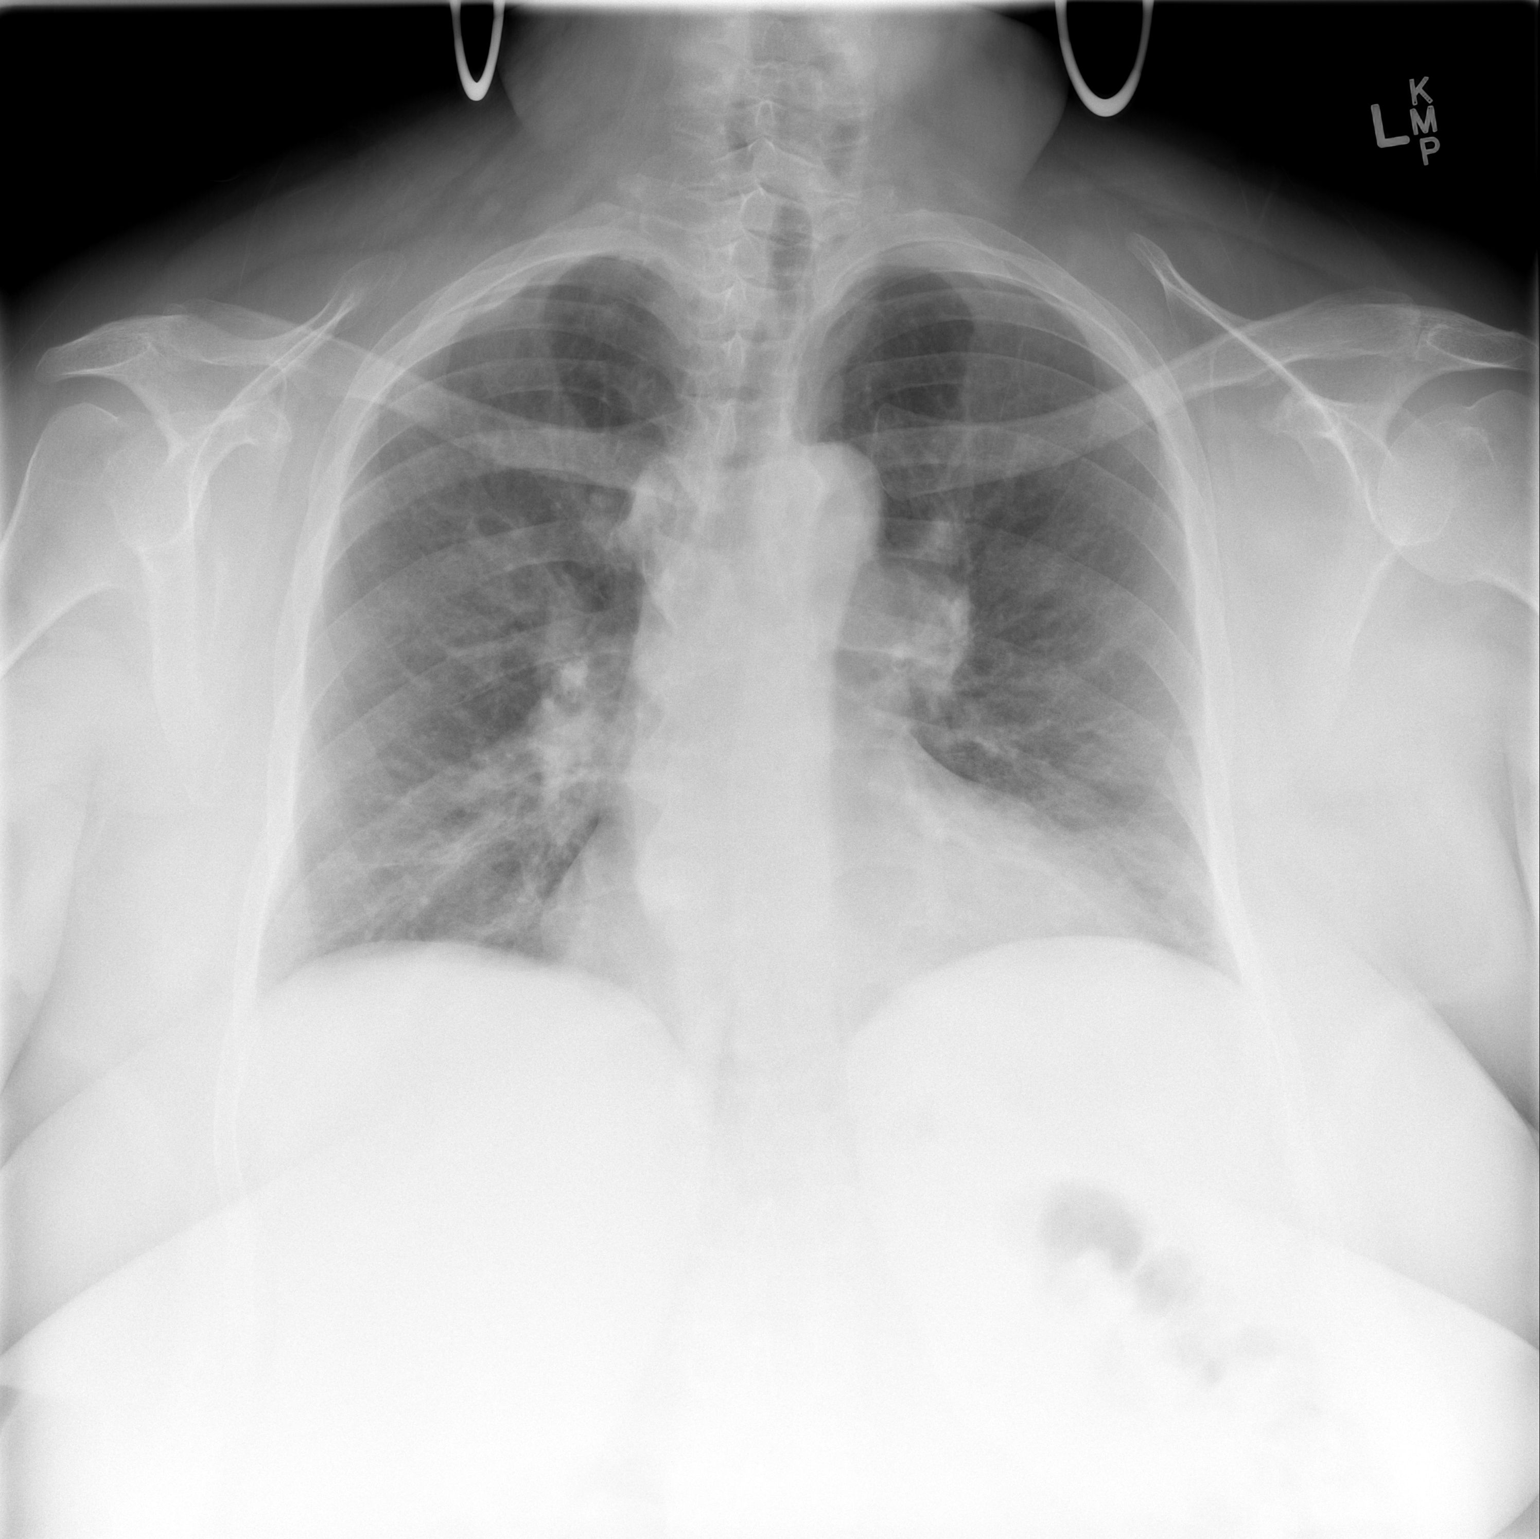

[w chest lat]
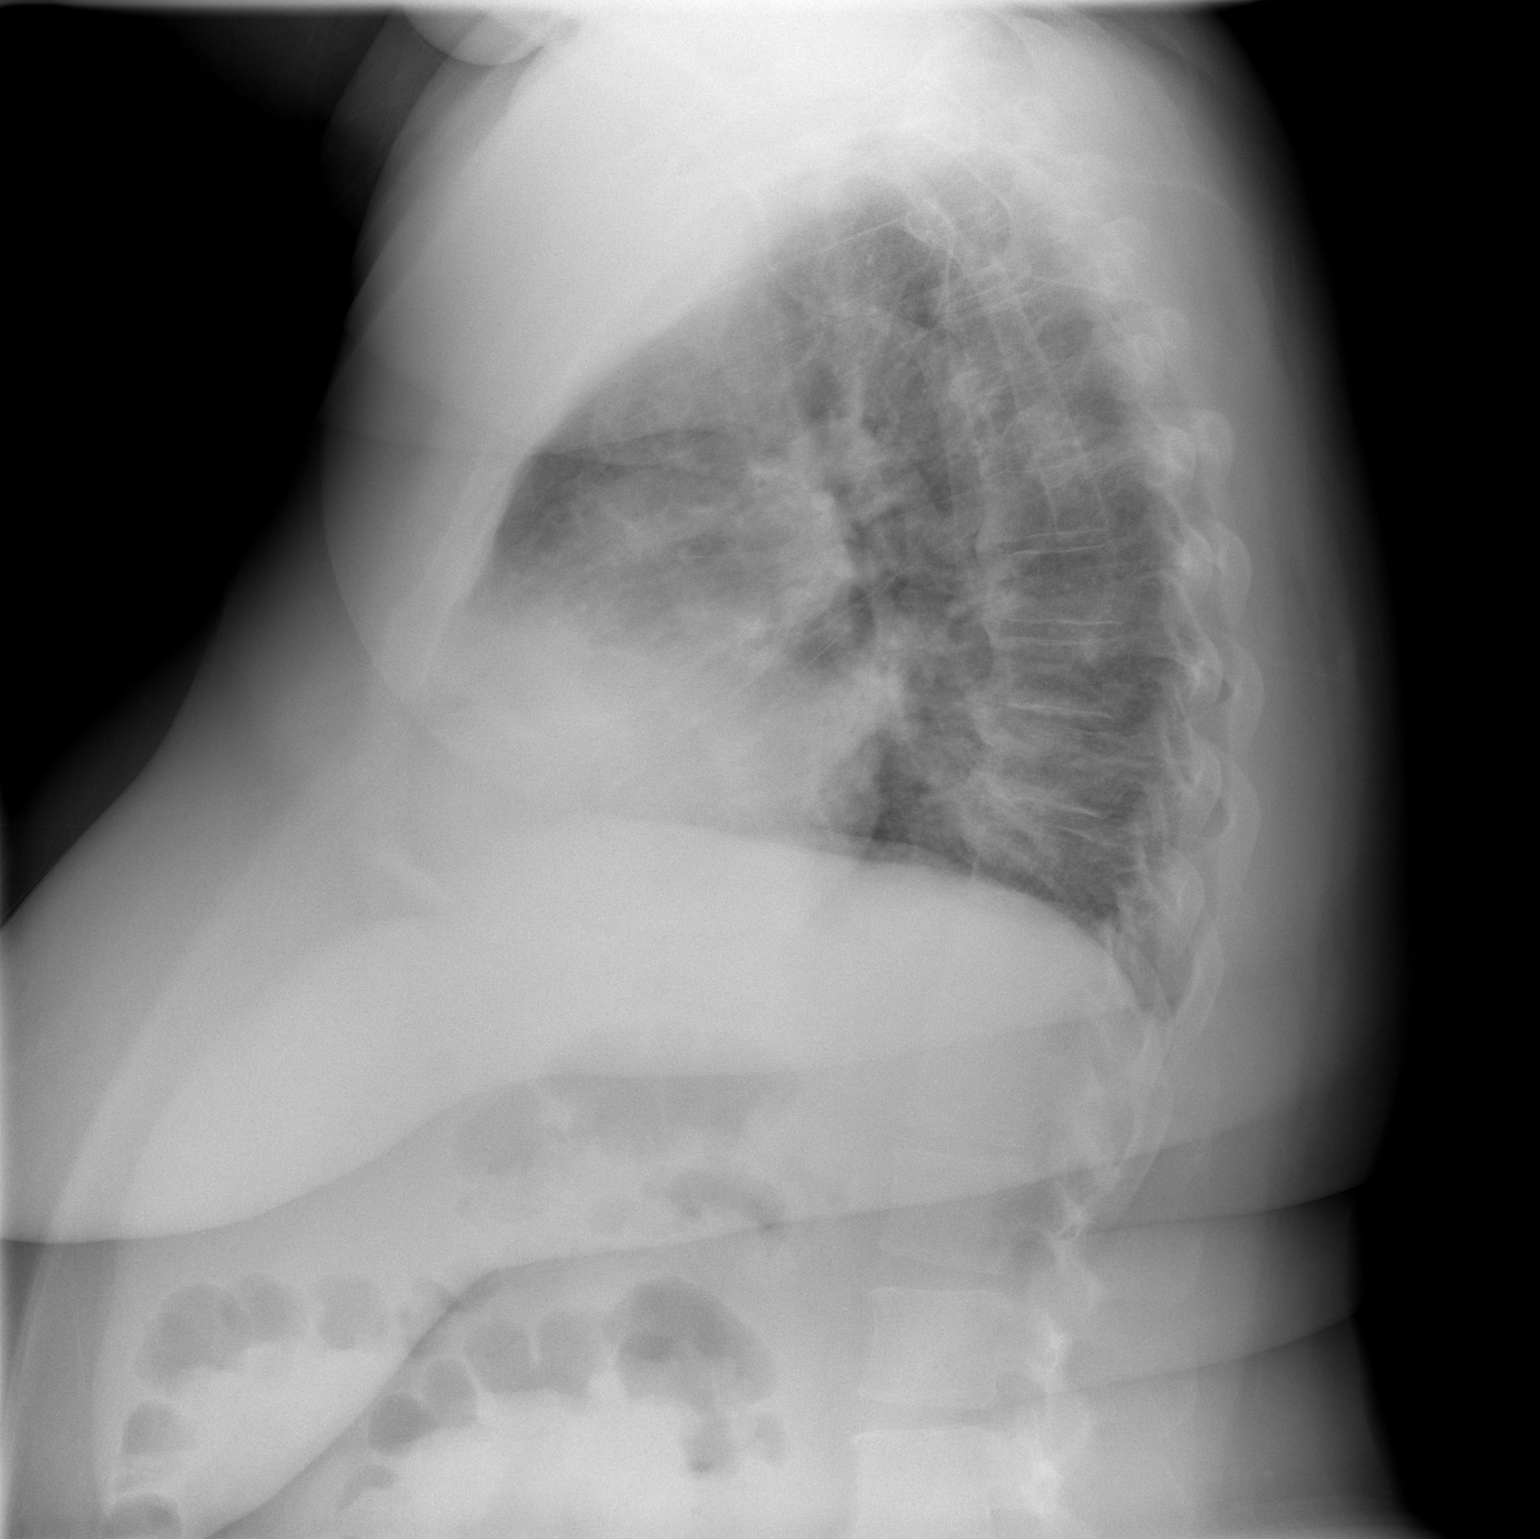

[2 of 2 positions shown; findings below may reference images not displayed]

FINDINGS: Heart is borderline in size. Mild peribronchial thickening. No
confluent opacities or effusions. Degenerative spurring in the
thoracic spine
IMPRESSION: Mild bronchitic changes.  Borderline heart size.

## 2018-01-31 MED ORDER — IPRATROPIUM-ALBUTEROL 0.5-2.5 (3) MG/3ML IN SOLN
3.0000 mL | Freq: Once | RESPIRATORY_TRACT | Status: AC
  Administered 2018-01-31: 3 mL via RESPIRATORY_TRACT
  Filled 2018-01-31: qty 3

## 2018-01-31 MED ORDER — ACETAMINOPHEN 325 MG PO TABS
650.0000 mg | ORAL_TABLET | Freq: Once | ORAL | Status: AC
  Administered 2018-01-31: 650 mg via ORAL
  Filled 2018-01-31: qty 2

## 2018-01-31 MED ORDER — ALBUTEROL SULFATE (2.5 MG/3ML) 0.083% IN NEBU
5.0000 mg | INHALATION_SOLUTION | Freq: Once | RESPIRATORY_TRACT | Status: AC
  Administered 2018-01-31: 5 mg via RESPIRATORY_TRACT
  Filled 2018-01-31: qty 6

## 2018-01-31 MED ORDER — PREDNISONE 20 MG PO TABS: 40.0000 mg | ORAL_TABLET | Freq: Every day | ORAL | 0 refills | Status: AC

## 2018-01-31 MED ORDER — PREDNISONE 20 MG PO TABS
60.0000 mg | ORAL_TABLET | Freq: Once | ORAL | Status: AC
  Administered 2018-01-31: 60 mg via ORAL
  Filled 2018-01-31: qty 3
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
## 2018-01-31 NOTE — ED Notes (Signed)
Pt hiding sandwich under blanket and eating.
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
## 2018-01-31 NOTE — ED Notes (Signed)
ED Provider at bedside. 
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
## 2018-01-31 NOTE — Patient Instructions (Signed)
Gout Gout is painful swelling that can occur in some of your joints. Gout is a type of arthritis. This condition is caused by having too much uric acid in your body. Uric acid is a chemical that forms when your body breaks down substances called purines. Purines are important for building body proteins. When your body has too much uric acid, sharp crystals can form and build up inside your joints. This causes pain and swelling. Gout attacks can happen quickly and be very painful (acute gout). Over time, the attacks can affect more joints and become more frequent (chronic gout). Gout can also cause uric acid to build up under your skin and inside your kidneys. What are the causes? This condition is caused by too much uric acid in your blood. This can occur because:  Your kidneys do not remove enough uric acid from your blood. This is the most common cause.  Your body makes too much uric acid. This can occur with some cancers and cancer treatments. It can also occur if your body is breaking down too many red blood cells (hemolytic anemia).  You eat too many foods that are high in purines. These foods include organ meats and some seafood. Alcohol, especially beer, is also high in purines.  A gout attack may be triggered by trauma or stress. What increases the risk? This condition is more likely to develop in people who:  Have a family history of gout.  Are female and middle-aged.  Are female and have gone through menopause.  Are obese.  Frequently drink alcohol, especially beer.  Are dehydrated.  Lose weight too quickly.  Have an organ transplant.  Have lead poisoning.  Take certain medicines, including aspirin, cyclosporine, diuretics, levodopa, and niacin.  Have kidney disease or psoriasis.  What are the signs or symptoms? An attack of acute gout happens quickly. It usually occurs in just one joint. The most common place is the big toe. Attacks often start at night. Other joints  that may be affected include joints of the feet, ankle, knee, fingers, wrist, or elbow. Symptoms may include:  Severe pain.  Warmth.  Swelling.  Stiffness.  Tenderness. The affected joint may be very painful to touch.  Shiny, red, or purple skin.  Chills and fever.  Chronic gout may cause symptoms more frequently. More joints may be involved. You may also have white or yellow lumps (tophi) on your hands or feet or in other areas near your joints. How is this diagnosed? This condition is diagnosed based on your symptoms, medical history, and physical exam. You may have tests, such as:  Blood tests to measure uric acid levels.  Removal of joint fluid with a needle (aspiration) to look for uric acid crystals.  X-rays to look for joint damage.  How is this treated? Treatment for this condition has two phases: treating an acute attack and preventing future attacks. Acute gout treatment may include medicines to reduce pain and swelling, including:  NSAIDs.  Steroids. These are strong anti-inflammatory medicines that can be taken by mouth (orally) or injected into a joint.  Colchicine. This medicine relieves pain and swelling when it is taken soon after an attack. It can be given orally or through an IV tube.  Preventive treatment may include:  Daily use of smaller doses of NSAIDs or colchicine.  Use of a medicine that reduces uric acid levels in your blood.  Changes to your diet. You may need to see a specialist about healthy eating (dietitian).  Follow these instructions at home: During a Gout Attack  If directed, apply ice to the affected area: ? Put ice in a plastic bag. ? Place a towel between your skin and the bag. ? Leave the ice on for 20 minutes, 2-3 times a day.  Rest the joint as much as possible. If the affected joint is in your leg, you may be given crutches to use.  Raise (elevate) the affected joint above the level of your heart as often as  possible.  Drink enough fluids to keep your urine clear or pale yellow.  Take over-the-counter and prescription medicines only as told by your health care provider.  Do not drive or operate heavy machinery while taking prescription pain medicine.  Follow instructions from your health care provider about eating or drinking restrictions.  Return to your normal activities as told by your health care provider. Ask your health care provider what activities are safe for you. Avoiding Future Gout Attacks  Follow a low-purine diet as told by your dietitian or health care provider. Avoid foods and drinks that are high in purines, including liver, kidney, anchovies, asparagus, herring, mushrooms, mussels, and beer.  Limit alcohol intake to no more than 1 drink a day for nonpregnant women and 2 drinks a day for men. One drink equals 12 oz of beer, 5 oz of wine, or 1 oz of hard liquor.  Maintain a healthy weight or lose weight if you are overweight. If you want to lose weight, talk with your health care provider. It is important that you do not lose weight too quickly.  Start or maintain an exercise program as told by your health care provider.  Drink enough fluids to keep your urine clear or pale yellow.  Take over-the-counter and prescription medicines only as told by your health care provider.  Keep all follow-up visits as told by your health care provider. This is important. Contact a health care provider if:  You have another gout attack.  You continue to have symptoms of a gout attack after10 days of treatment.  You have side effects from your medicines.  You have chills or a fever.  You have burning pain when you urinate.  You have pain in your lower back or belly. Get help right away if:  You have severe or uncontrolled pain.  You cannot urinate.   Information for patients with Gout  Gout defined-Gout occurs when urate crystals accumulate in your joint causing the  inflammation and intense pain of gout attack.  Urate crystals can form when you have high levels of uric acid in your blood.  Your body produces uric acid when it breaks down prurines-substances that are found naturally in your body, as well as in certain foods such as organ meats, anchioves, herring, asparagus, and mushrooms.  Normally uric acid dissolves in your blood and passes through your kidneys into your urine.  But sometimes your body either produces too much uric acid or your kidneys excrete too little uric acid.  When this happens, uric acid can build up, forming sharp needle-like urate crystals in a joint or surrounding tissue that cause pain, inflammation and swelling.    Gout is characterized by sudden, severe attacks of pain, redness and tenderness in joints, often the joint at the base of the big toe.  Gout is complex form of arthritis that can affect anyone.  Men are more likely to get gout but women become increasingly more susceptible to gout after menopause.  An  acute attack of gout can wake you up in the middle of the night with the sensation that your big toe is on fire.  The affected joint is hot, swollen and so tender that even the weight or the sheet on it may seem intolerable.  If you experience symptoms of an acute gout attack it is important to your doctor as soon as the symptoms start.  Gout that goes untreated can lead to worsening pain and joint damage.  Risk Factors:  You are more likely to develop gout if you have high levels of uric acid in your body.    Factors that increase the uric acid level in your body include:  Lifestyle factors.  Excessive alcohol use-generally more than two drinks a day for men and more than one for women increase the risk of gout.  Medical conditions.  Certain conditions make it more likely that you will develop gout.  These include hypertension, and chronic conditions such as diabetes, high levels of fat and cholesterol in the blood, and  narrowing of the arteries.  Certain medications.  The uses of Thiazide diuretics- commonly used to treat hypertension and low dose aspirin can also increase uric acid levels.  Family history of gout.  If other members of your family have had gout, you are more likely to develop the disease.  Age and sex. Gout occurs more often in men than it does in women, primarily because women tend to have lower uric acid levels than men do.  Men are more likely to develop gout earlier usually between the ages of 68-50- whereas women generally develop signs and symptoms after menopause.    Tests and diagnosis:  Tests to help diagnose gout may include:  Blood test.  Your doctor may recommend a blood test to measure the uric acid level in your blood .  Blood tests can be misleading, though.  Some people have high uric acid levels but never experience gout.  And some people have signs and symptoms of gout, but don't have unusual levels of uric acid in their blood.  Joint fluid test.  Your doctor may use a needle to draw fluid from your affected joint.  When examined under the microscope, your joint fluid may reveal urate crystals.  Treatment:  Treatment for gout usually involves medications.  What medications you and your doctor choose will be based on your current health and other medications you currently take.  Gout medications can be used to treat acute gout attacks and prevent future attacks as well as reduce your risk of complications from gout such as the development of tophi from urate crystal deposits.  Alternative medicine:   Certain foods have been studied for their potential to lower uric acid levels, including:  Coffee.  Studies have found an association between coffee drinking (regular and decaf) and lower uric acid levels.  The evidence is not enough to encourage non-coffee drinkers to start, but it may give clues to new ways of treating gout in the future.  Vitamin C.  Supplements  containing vitamin C may reduce the levels of uric acid in your blood.  However, vitamin as a treatment for gout. Don't assume that if a little vitamin C is good, than lots is better.  Megadoses of vitamin C may increase your bodies uric acid levels.  Cherries.  Cherries have been associated with lower levels of uric acid in studies, but it isn't clear if they have any effect on gout signs and symptoms.  Eating more cherries and other dar-colored fruits, such as blackberries, blueberries, purple grapes and raspberries, may be a safe way to support your gout treatment.    Lifestyle/Diet Recommendations:   Drink 8 to 16 cups ( about 2 to 4 liters) of fluid each day, with at least half being water.  Avoid alcohol  Eat a moderate amount of protein, preferably from healthy sources, such as low-fat or fat-free dairy, tofu, eggs, and nut butters.  Limit you daily intake of meat, fish, and poultry to 4 to 6 ounces.  Avoid high fat meats and desserts.  Decrease you intake of shellfish, beef, lamb, pork, eggs and cheese.  Choose a good source of vitamin C daily such as citrus fruits, strawberries, broccoli,  brussel sprouts, papaya, and cantaloupe.   Choose a good source of vitamin A every other day such as yellow fruits, or dark green/yellow vegetables.  Avoid drastic weigh reduction or fasting.  If weigh loss is desired lose it over a period of several months.  See "dietary considerations.." chart for specific food recommendations.  Dietary Considerations for people with Gout  Food with negligible purine content (0-15 mg of purine nitrogen per 100 grams food)  May use as desired except on calorie variations  Non fat milk Cocoa Cereals (except in list II) Hard candies  Buttermilk Carbonated drinks Vegetables (except in list II) Sherbet  Coffee Fruits Sugar Honey  Tea Cottage Cheese Gelatin-jell-o Salt  Fruit juice Breads Angel food Cake   Herbs/spices Jams/Jellies [employer]    Foods that do not contain excessive purine content, but must be limited due to fat content  Cream Eggs Oil and Salad Dressing  Half and Half Peanut Butter Chocolate  Whole Milk Cakes Potato Chips  Butter Ice Cream Fried Foods  Cheese Nuts Waffles, pancakes   List II: Food with moderate purine content (50-150 mg of purine nitrogen per 100 grams of food)  Limit total amount each day to 5 oz. cooked Lean meat, other than those on list III   Poultry, other than those on list III Fish, other than those on list III   Seafood, other than those on list III  These foods may be used occasionally  Peas Lentils Bran  Spinach Oatmeal Dried Beans and Peas  Asparagus Wheat Germ Mushrooms   Additional information about meat choices  Choose fish and poultry, particularly without skin, often.  Select lean, well trimmed cuts of meat.  Avoid all fatty meats, bacon , sausage, fried meats, fried fish, or poultry, luncheon meats, cold cuts, hot dogs, meats canned or frozen in gravy, spareribs and frozen and packaged prepared meats.   List III: Foods with HIGH purine content / Foods to AVOID (150-800 mg of purine nitrogen per 100 grams of food)  Anchovies Herring Meat Broths  Liver Mackerel Meat Extracts  Kidney Scallops Meat Drippings  Sardines Wild Game Mincemeat  Sweetbreads Goose Gravy  Heart Tongue Yeast, baker's and brewers   Commercial soups made with any of the foods listed in List II or List III  In addition avoid all alcoholic beverages

## 2018-02-01 NOTE — ED Notes (Signed)
Patient verbalizes understanding of discharge instructions. Opportunity for questioning and answers were provided. Armband removed by staff, pt discharged from ED home via POV.  

## 2018-02-04 ENCOUNTER — Telehealth: Payer: Self-pay | Admitting: *Deleted

## 2018-02-04 NOTE — Telephone Encounter (Signed)
Pt states she is unable to read the compression hose prescription. I told pt I would send a note to Dr. Elisha Ponder for the location of the medical supply store.

## 2018-02-05 NOTE — Telephone Encounter (Addendum)
Unable to leave a voicemail, pt's answering service played 2 minutes of music after pt requested a call back, no available space to leave a message. Faxed copy of compression hose rx 15-89mmHg knee-length to [facility] and mailed a copy of the rx to pt with a note to check the map for a location convenient to her.

## 2018-02-05 NOTE — Telephone Encounter (Signed)
Hi Val!  I believe it was for [facility] because she said she couldn't get transportation to [place].

## 2018-02-06 ENCOUNTER — Encounter: Payer: Self-pay | Admitting: *Deleted

## 2018-02-06 NOTE — Telephone Encounter (Signed)
Pt called stating she had been cooking her breakfast and couldn't get the phone.

## 2018-02-06 NOTE — Telephone Encounter (Signed)
Dr. Elisha Ponder states rx was given for pt to have diabetic shoes with heat molded inserts from [facility]. Unable to leave a message on voicemail. Mailed letter with [facility] contact and location information and rx for diabetic shoes with heat molded inserts.

## 2018-02-06 NOTE — Telephone Encounter (Signed)
I informed pt she would be getting 2 letters from the [place] [facility] [place], one for the compression hose at [facility] and one for the diabetic shoes and custom molded inserts. Pt states understanding. Faxed orders for the diabetic shoes and 3 pairs of custom molded inserts to [facility].

## 2018-02-15 ENCOUNTER — Encounter: Payer: Self-pay | Admitting: Podiatry

## 2018-02-15 NOTE — Progress Notes (Signed)
Subjective: Michele Owens presents today with history of neuropathy with cc of painful, mycotic toenails.  Pain is aggravated when wearing enclosed shoe gear and relieved with periodic professional debridement.  Patient has h/o diabetic neuropathy.  She is requesting new diabetic shoes on today.   Objective:  Vascular Examination: Capillary refill time <4 seconds x 10 Dorsalis pedis and Posterior tibial pulses 1/4 b/l Digital hair x 10 digits was absent  Skin temperature gradient WNL b/l Edema BLE  Dermatological Examination: No open wounds b/l No interdigital macerations b/l Anonychia b/l great toes and right 2nd digit with epithelialization  Toenails 3-5 right , 2-5 left discolored, thick, dystrophic with subungual debris and pain with palpation to nailbeds due to thickness of nails.  Musculoskeletal: Muscle strength 5/5 b/l LE Mild contractures 2-5 b/l Minimal tenderness to medial tubercle b/l heels  Neurological: Sensation with 10 gram monofilament. Vibratory sensation   Assessment: 1. Painful onychomycosis toenails 1-5 b/l 2. NIDDM with diabetic neuropathy  Plan: 1. Toenails 1-5 b/l were debrided in length and girth without iatrogenic bleeding. 2. Patient given Rx for [facility] for a new pair of diabetic shoes with heat moldable insoles. 3. Patient given Rx for [place] for new compression hose  4. Patient to continue soft, supportive shoe gear 5. Patient to report any pedal injuries to medical professional  6. Follow up 3 months. Patient/POA to call should there be a concern in the interim.

## 2018-02-20 ENCOUNTER — Telehealth: Payer: Self-pay | Admitting: Podiatry

## 2018-02-20 NOTE — Telephone Encounter (Signed)
Pt needs you to call that place that, you sent her to. They said you need to call them.

## 2018-02-21 NOTE — Telephone Encounter (Signed)
I spoke with Michele Owens and explained pt had called with a cryptic message stating that one of the places we referred her to did not do that anymore. Ronalee Belts states it may be, they do not file insurance.

## 2018-02-21 NOTE — Telephone Encounter (Signed)
I informed pt of my communications with [facility] and [employer].

## 2018-02-21 NOTE — Telephone Encounter (Signed)
I spoke with Michele Owens - [facility], and asked if they still took orders for medicaid pts needing diabetic shoes and orthotics and if I needed to call prior to faxing orders. Michele Owens stated just fax orders. I asked Michele Owens if she had orders on [facility] and she stated pt did not sound familiar. I have have confirmation the orders were received by [facility], refaxed to [facility].

## 2018-02-25 ENCOUNTER — Other Ambulatory Visit: Payer: Self-pay

## 2018-02-25 ENCOUNTER — Emergency Department (HOSPITAL_COMMUNITY): Payer: Medicaid Other

## 2018-02-25 ENCOUNTER — Encounter (HOSPITAL_COMMUNITY): Payer: Self-pay

## 2018-02-25 ENCOUNTER — Emergency Department (HOSPITAL_COMMUNITY)
Admission: EM | Admit: 2018-02-25 | Discharge: 2018-02-25 | Disposition: A | Discharge status: Home / Self Care | Payer: Medicaid Other | Attending: Emergency Medicine | Admitting: Emergency Medicine

## 2018-02-25 DIAGNOSIS — Z7902 Long term (current) use of antithrombotics/antiplatelets: Secondary | ICD-10-CM | POA: Insufficient documentation

## 2018-02-25 DIAGNOSIS — Z85850 Personal history of malignant neoplasm of thyroid: Secondary | ICD-10-CM | POA: Diagnosis not present

## 2018-02-25 DIAGNOSIS — Z87891 Personal history of nicotine dependence: Secondary | ICD-10-CM | POA: Diagnosis not present

## 2018-02-25 DIAGNOSIS — R51 Headache: Secondary | ICD-10-CM | POA: Insufficient documentation

## 2018-02-25 DIAGNOSIS — I110 Hypertensive heart disease with heart failure: Secondary | ICD-10-CM | POA: Insufficient documentation

## 2018-02-25 DIAGNOSIS — J449 Chronic obstructive pulmonary disease, unspecified: Secondary | ICD-10-CM | POA: Diagnosis not present

## 2018-02-25 DIAGNOSIS — Z7984 Long term (current) use of oral hypoglycemic drugs: Secondary | ICD-10-CM | POA: Diagnosis not present

## 2018-02-25 DIAGNOSIS — R519 Headache, unspecified: Secondary | ICD-10-CM

## 2018-02-25 DIAGNOSIS — Z79899 Other long term (current) drug therapy: Secondary | ICD-10-CM | POA: Diagnosis not present

## 2018-02-25 DIAGNOSIS — I5032 Chronic diastolic (congestive) heart failure: Secondary | ICD-10-CM | POA: Insufficient documentation

## 2018-02-25 MED ORDER — SODIUM CHLORIDE 0.9 % IV BOLUS
1000.0000 mL | Freq: Once | INTRAVENOUS | Status: AC
  Administered 2018-02-25: 1000 mL via INTRAVENOUS

## 2018-02-25 MED ORDER — METOCLOPRAMIDE HCL 5 MG/ML IJ SOLN
10.0000 mg | Freq: Once | INTRAMUSCULAR | Status: AC
  Administered 2018-02-25: 10 mg via INTRAVENOUS
  Filled 2018-02-25: qty 2

## 2018-02-25 MED ORDER — HYDROCODONE-ACETAMINOPHEN 5-325 MG PO TABS
2.0000 | ORAL_TABLET | Freq: Once | ORAL | Status: AC
  Administered 2018-02-25: 2 via ORAL
  Filled 2018-02-25: qty 2

## 2018-02-25 MED ORDER — DIPHENHYDRAMINE HCL 50 MG/ML IJ SOLN
25.0000 mg | Freq: Once | INTRAMUSCULAR | Status: AC
  Administered 2018-02-25: 25 mg via INTRAVENOUS
  Filled 2018-02-25: qty 1

## 2018-02-25 NOTE — ED Notes (Signed)
Patient transported to CT 

## 2018-02-25 NOTE — ED Triage Notes (Addendum)
Per EMS, patient has a history of migraines and is complaining of a headache that starts on the top and moves to the back like a "rollarcoaster across her head". Patient states that the pain is really sharp and feels different from her other migraines. Denies visual changes or disturbances. Patient not take anything at home for headache.

## 2018-02-25 NOTE — ED Notes (Signed)
Bed: GX21 Expected date:  Expected time:  Means of arrival:  Comments: 57 yo F/headache

## 2018-02-25 NOTE — ED Notes (Signed)
Contacted [facility] for transport home.

## 2018-02-25 NOTE — ED Provider Notes (Signed)
[facility] DEPT Provider Note   CSN: [id number] Arrival date & time: 02/25/18  0127     History   Chief Complaint No chief complaint on file.   HPI Michele Owens is a 57 y.o. female.  Patient presents to the emergency department with a chief complaint of headache.  She reports a history of migraines.  States that she has had a headache since this afternoon.  It has been gradually worsening.  She states that she has been unable to sleep tonight because of the headache.  It got worse when she attempted to lie down to go to bed.  She denies any fevers or neck stiffness.  She does report some sensitivity to light.  She tried taking some Excedrin Migraine yesterday afternoon with little relief.  She denies any new numbness, weakness, or tingling.  Denies any vision changes.  States that this headache seems worse than her typical migraines.  The history is provided by the patient. No language interpreter was used.    Past Medical History:  Diagnosis Date  . Anxiety   . Arthritis   . Asthma   . Chronic diastolic CHF (congestive heart failure) ([place])   . COPD (chronic obstructive pulmonary disease) (H[facility])    Uses Oxygen at night  . Depression   . Diabetes mellitus without complication ([place])   . Dyspnea    occ  . Family history of thyroid cancer   . GERD (gastroesophageal reflux disease)   . Gout   . Headache    migraines  . History of DVT of lower extremity   . History of nuclear stress test    Myoview 2/17:  Low risk stress nuclear study with a small, moderate intensity, partially reversible inferior lateral defect consistent with small prior infarct and minimal peri-infarct ischemia; EF 68 with normal wall motion  . Hypertension   . Pneumonia   . Thyroid cancer ([place]) 09/23/2016    Patient Active Problem List   Diagnosis Date Noted  . Hypertensive disorder 08/28/2017  . Obesity 08/28/2017  . COPD exacerbation ([place]) 08/04/2017  . Acute and  chronic respiratory failure with hypercapnia ([place])   . Acute on chronic diastolic heart failure ([place])   . Pulmonary HTN ([place])   . Genetic testing 02/27/2017  . Family history of thyroid cancer   . Hypomagnesemia 10/03/2016  . Boil, breast 10/03/2016  . Atypical chest pain   . HLD (hyperlipidemia) 09/23/2016  . COPD (chronic obstructive pulmonary disease) ([place]) 09/23/2016  . Gout 09/23/2016  . DVT (deep vein thrombosis) in pregnancy 09/23/2016  . Thyroid cancer ([place]) 09/23/2016  . Hypocalcemia 09/23/2016  . AKI (acute kidney injury) ([place]) 09/23/2016  . 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 Acute pulmonary edema (H[facility])   . Acute hypoxemic respiratory failure ([place])   . Acute on chronic respiratory failure with hypoxemia ([place])   . Ventilator dependent ([place])   . Respiratory failure ([place]) 07/16/2016  . Chronic combined systolic (congestive) and diastolic (congestive) heart failure ([place]) 02/10/2016  . Dyspnea   . CAP (community acquired pneumonia) 01/23/2016  . 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 Pulmonary hypertension ([place]) 01/23/2016  . Sepsis ([place]) 01/23/2016  . Obesity hypoventilation syndrome ([place]) 08/26/2015  . Chest pain 06/16/2015  . Dyslipidemia associated with type 2 diabetes mellitus ([place]) 04/24/2015  . Leukocytosis 04/24/2015  . Controlled type 2 diabetes mellitus without complication, without long-term current use of insulin ([place]) 04/24/2015  . Anxiety 04/24/2015  .  Benign essential HTN 04/24/2015  . Normocytic anemia 04/24/2015  . Asthma with COPD with exacerbation ([place]) 04/24/2015  . OSA (obstructive sleep apnea) 05/10/2012  . Tobacco abuse 07/04/2011    Past Surgical History:  Procedure Laterality Date  . BUNIONECTOMY Bilateral   . CARDIAC CATHETERIZATION N/A 06/23/2015   Procedure: Right/Left Heart Cath and Coronary Angiography;  Surgeon: [name], MD;  Location: [facility];  Service:  Cardiovascular;  Laterality: N/A;  . COLONOSCOPY WITH PROPOFOL N/A 12/15/2015   Procedure: COLONOSCOPY WITH PROPOFOL;  Surgeon: [name], MD;  Location: [facility];  Service: Endoscopy;  Laterality: N/A;  . THYROIDECTOMY  09/19/2016  . THYROIDECTOMY N/A 09/19/2016   Procedure: TOTAL THYROIDECTOMY;  Surgeon: [name], MD;  Location: [facility];  Service: General;  Laterality: N/A;  . TONSILLECTOMY    . TOTAL ABDOMINAL HYSTERECTOMY  07/14/10     OB History   None      Home Medications    Prior to Admission medications   Medication Sig Start Date End Date Taking? Authorizing Provider  albuterol (PROVENTIL HFA;VENTOLIN HFA) 108 (90 BASE) MCG/ACT inhaler Inhale 1-2 puffs into the lungs every 6 (six) hours as needed for wheezing. Patient taking differently: Inhale 1 puff into the lungs 2 (two) times daily.  04/09/12   Billy Fischer, MD  allopurinol (ZYLOPRIM) 100 MG tablet Take 100 mg by mouth daily.    [provider]  Blood Glucose Monitoring Suppl (ACCU-CHEK AVIVA PLUS) w/Device KIT USE AS DIRECTED TO CHECK BLOOD SUGAR 08/07/17   [provider]  calcitRIOL (ROCALTROL) 0.5 MCG capsule Take 1 capsule (0.5 mcg total) by mouth daily. 09/26/16   Hongalgi, [name], MD  carvedilol (COREG) 6.25 MG tablet Take 1 tablet (6.25 mg total) by mouth 2 (two) times daily with a meal. 01/23/18   Bensimhon, [name], MD  cetirizine (ZYRTEC) 10 MG tablet Take 10 mg by mouth daily. 07/10/17   [provider]  clonazePAM (KLONOPIN) 2 MG tablet Take 2 mg by mouth 3 (three) times daily. for anxiety 05/08/17   [provider]  diclofenac sodium (VOLTAREN) 1 % GEL Apply 2 g topically 4 (four) times daily as needed (for pain).     [provider]  doxycycline (VIBRAMYCIN) 100 MG capsule Take 1 capsule (100 mg total) by mouth 2 (two) times daily. Patient not taking: Reported on 01/27/2018 01/12/18   Dalia Heading, PA-C  EPINEPHrine 0.3 mg/0.3 mL IJ SOAJ injection Inject  0.3 mg into the muscle daily as needed (allergic reaction).    [provider]  FEROSUL 325 (65 Fe) MG tablet Take 325 mg by mouth daily. 03/22/17   [provider]  glucose blood test strip Use as instructed to check blood sugar up to 4 times daily 08/06/17   [name], DO  ipratropium-albuterol (DUONEB) 0.5-2.5 (3) MG/3ML SOLN Take 3 mLs by nebulization 4 (four) times daily.  04/05/17   [provider]  levothyroxine (SYNTHROID, LEVOTHROID) 112 MCG tablet Take 224 mcg by mouth daily. 07/11/17   [provider]  lisinopril (PRINIVIL,ZESTRIL) 5 MG tablet Take 1 tablet (5 mg total) by mouth daily. Please call for office visit [phone] 08/15/17   [name], MD  loperamide (IMODIUM) 2 MG capsule Take 1 capsule (2 mg total) by mouth 4 (four) times daily as needed for diarrhea or loose stools. 1/61/09   [name], MD  metFORMIN (GLUCOPHAGE) 500 MG tablet Take 1 tablet (500 mg total) by mouth 2 (  two) times daily with a meal. 12/11/14   [name], MD  metoCLOPramide (REGLAN) 10 MG tablet Take 1 tablet (10 mg total) by mouth every 6 (six) hours as needed for nausea (or headache). 07/03/47   [name], MD  metoprolol succinate (TOPROL-XL) 25 MG 24 hr tablet Take 1 tablet (25 mg total) by mouth daily. 08/07/17   [name], DO  nystatin cream (MYCOSTATIN) Apply 1 application topically 2 (two) times daily as needed for dry skin.  04/13/17   [provider]  omeprazole (PRILOSEC) 40 MG capsule Take 40 mg by mouth daily. 04/13/15   [provider]  potassium chloride SA (K-DUR,KLOR-CON) 20 MEQ tablet Take 2 tablets (40 mEq total) by mouth daily. Patient taking differently: Take 20 mEq by mouth daily.  08/01/16   [name], MD  pravastatin (PRAVACHOL) 40 MG tablet Take 40 mg by mouth every evening.  03/29/15   [provider]  QC STOOL SOFTENER PLS LAXATIVE 8.6-50 MG tablet Take 1 tablet by mouth at bedtime. 04/11/17    [provider]  spironolactone (ALDACTONE) 100 MG tablet Take 100 mg by mouth daily. 10/02/17   [provider]  tiotropium (SPIRIVA) 18 MCG inhalation capsule Place 18 mcg into inhaler and inhale daily.    [provider]    Family History Family History  Problem Relation Age of Onset  . Cancer Father        thought to be due to exposure to concrete  . Diabetes Mother   . Thyroid cancer Mother        dx in her 66s-60s  . Cancer Maternal Uncle        2 uncles with cancer NOS  . Brain cancer Paternal Aunt   . Cancer Cousin        maternal first cousin - NOS  . Cancer Cousin        maternal first cousin - NOS    Social History Social History   Tobacco Use  . Smoking status: Former Smoker    Packs/day: 0.50    Years: 41.00    Pack years: 20.50    Types: Cigarettes    Last attempt to quit: 07/2016    Years since quitting: 1.6  . Smokeless tobacco: Never Used  . Tobacco comment: Starting to Wean Off -- using Nicotine Patch  Substance Use Topics  . Alcohol use: Not Currently    Alcohol/week: 0.0 standard drinks    Comment: ? none now  . Drug use: Never    Comment: none for long time     Allergies   Bee venom; Fioricet [butalbital-apap-caffeine]; Ibuprofen; Lamisil [terbinafine]; and Nsaids   Review of Systems Review of Systems  All other systems reviewed and are negative.    Physical Exam Updated Vital Signs BP 109/67   Pulse 82   Temp 98 F (36.7 C) (Oral)   Resp 16   Ht '5\' 5"'  (1.651 m)   Wt (!) 141.2 kg   SpO2 98%   BMI 51.80 kg/m   Physical Exam  Constitutional: She is oriented to person, place, and time. She appears well-developed and well-nourished.  HENT:  Head: Normocephalic and atraumatic.  Right Ear: External ear normal.  Left Ear: External ear normal.  Eyes: Pupils are equal, round, and reactive to light. Conjunctivae and EOM are normal.  Neck: Normal range of motion. Neck supple.  No pain with neck flexion, no  meningismus  Cardiovascular: Normal rate, regular rhythm and normal  heart sounds. Exam reveals no gallop and no friction rub.  No murmur heard. Pulmonary/Chest: Effort normal and breath sounds normal. No respiratory distress. She has no wheezes. She has no rales. She exhibits no tenderness.  Abdominal: Soft. She exhibits no distension and no mass. There is no tenderness. There is no rebound and no guarding.  Musculoskeletal: Normal range of motion. She exhibits no edema or tenderness.  Normal gait.  Neurological: She is alert and oriented to person, place, and time. She has normal reflexes.  CN 3-12 intact, normal finger to nose, no pronator drift, sensation and strength intact bilaterally.  Skin: Skin is warm and dry.  Psychiatric: She has a normal mood and affect. Her behavior is normal. Judgment and thought content normal.  Nursing note and vitals reviewed.    ED Treatments / Results  Labs (all labs ordered are listed, but only abnormal results are displayed) Labs Reviewed - No data to display  EKG None  Radiology Ct Head Wo Contrast  Result Date: 02/25/2018 CLINICAL DATA:  Pain starting at the top of the head and going to the back. History of migraines. History of hypertension, diabetes, thyroid cancer. EXAM: CT HEAD WITHOUT CONTRAST TECHNIQUE: Contiguous axial images were obtained from the base of the skull through the vertex without intravenous contrast. COMPARISON:  04/24/2015 FINDINGS: Brain: No evidence of acute infarction, hemorrhage, hydrocephalus, extra-axial collection or mass lesion/mass effect. Vascular: Mild intracranial arterial vascular calcifications. Skull: Normal. Negative for fracture or focal lesion. Sinuses/Orbits: No acute finding. Other: None. IMPRESSION: No acute intracranial abnormality. Electronically Signed   By: Lucienne Capers M.D.   On: 02/25/2018 03:30    Procedures Procedures (including critical care time)  Medications Ordered in ED Medications    metoCLOPramide (REGLAN) injection 10 mg (has no administration in time range)  diphenhydrAMINE (BENADRYL) injection 25 mg (has no administration in time range)  sodium chloride 0.9 % bolus 1,000 mL (has no administration in time range)  HYDROcodone-acetaminophen (NORCO/VICODIN) 5-325 MG per tablet 2 tablet (has no administration in time range)     Initial Impression / Assessment and Plan / ED Course  I have reviewed the triage vital signs and the nursing notes.  Pertinent labs & imaging results that were available during my care of the patient were reviewed by me and considered in my medical decision making (see chart for details).     Pt HA treated and improved while in ED.  Presentation is like pts typical HA, but stronger than normal.  Doubt SAH, ICH, Meningitis, or temporal arteritis. Will check CT since patient states worse than normal and requests imaging.    CT is negative.   Pt is afebrile with no focal neuro deficits, nuchal rigidity, or change in vision. Pt is to follow up with PCP to discuss prophylactic medication. Pt verbalizes understanding and is agreeable with plan to dc.    Final Clinical Impressions(s) / ED Diagnoses   Final diagnoses:  Nonintractable headache, unspecified chronicity pattern, unspecified headache type    ED Discharge Orders    None       Montine Circle, PA-C 40/97/35 [id number]    [name], MD 24/26/83 [phone]

## 2018-04-01 ENCOUNTER — Other Ambulatory Visit (HOSPITAL_COMMUNITY): Payer: Self-pay | Admitting: Internal Medicine

## 2018-04-14 IMAGING — DX DG CHEST 2V
2 series · 2 of 2 positions shown · non-contrast
Comparison: May 28, 2015

CLINICAL DATA: Chest pain for 7 days; hypertension

EXAM:
CHEST  2 VIEW

[chest pa]
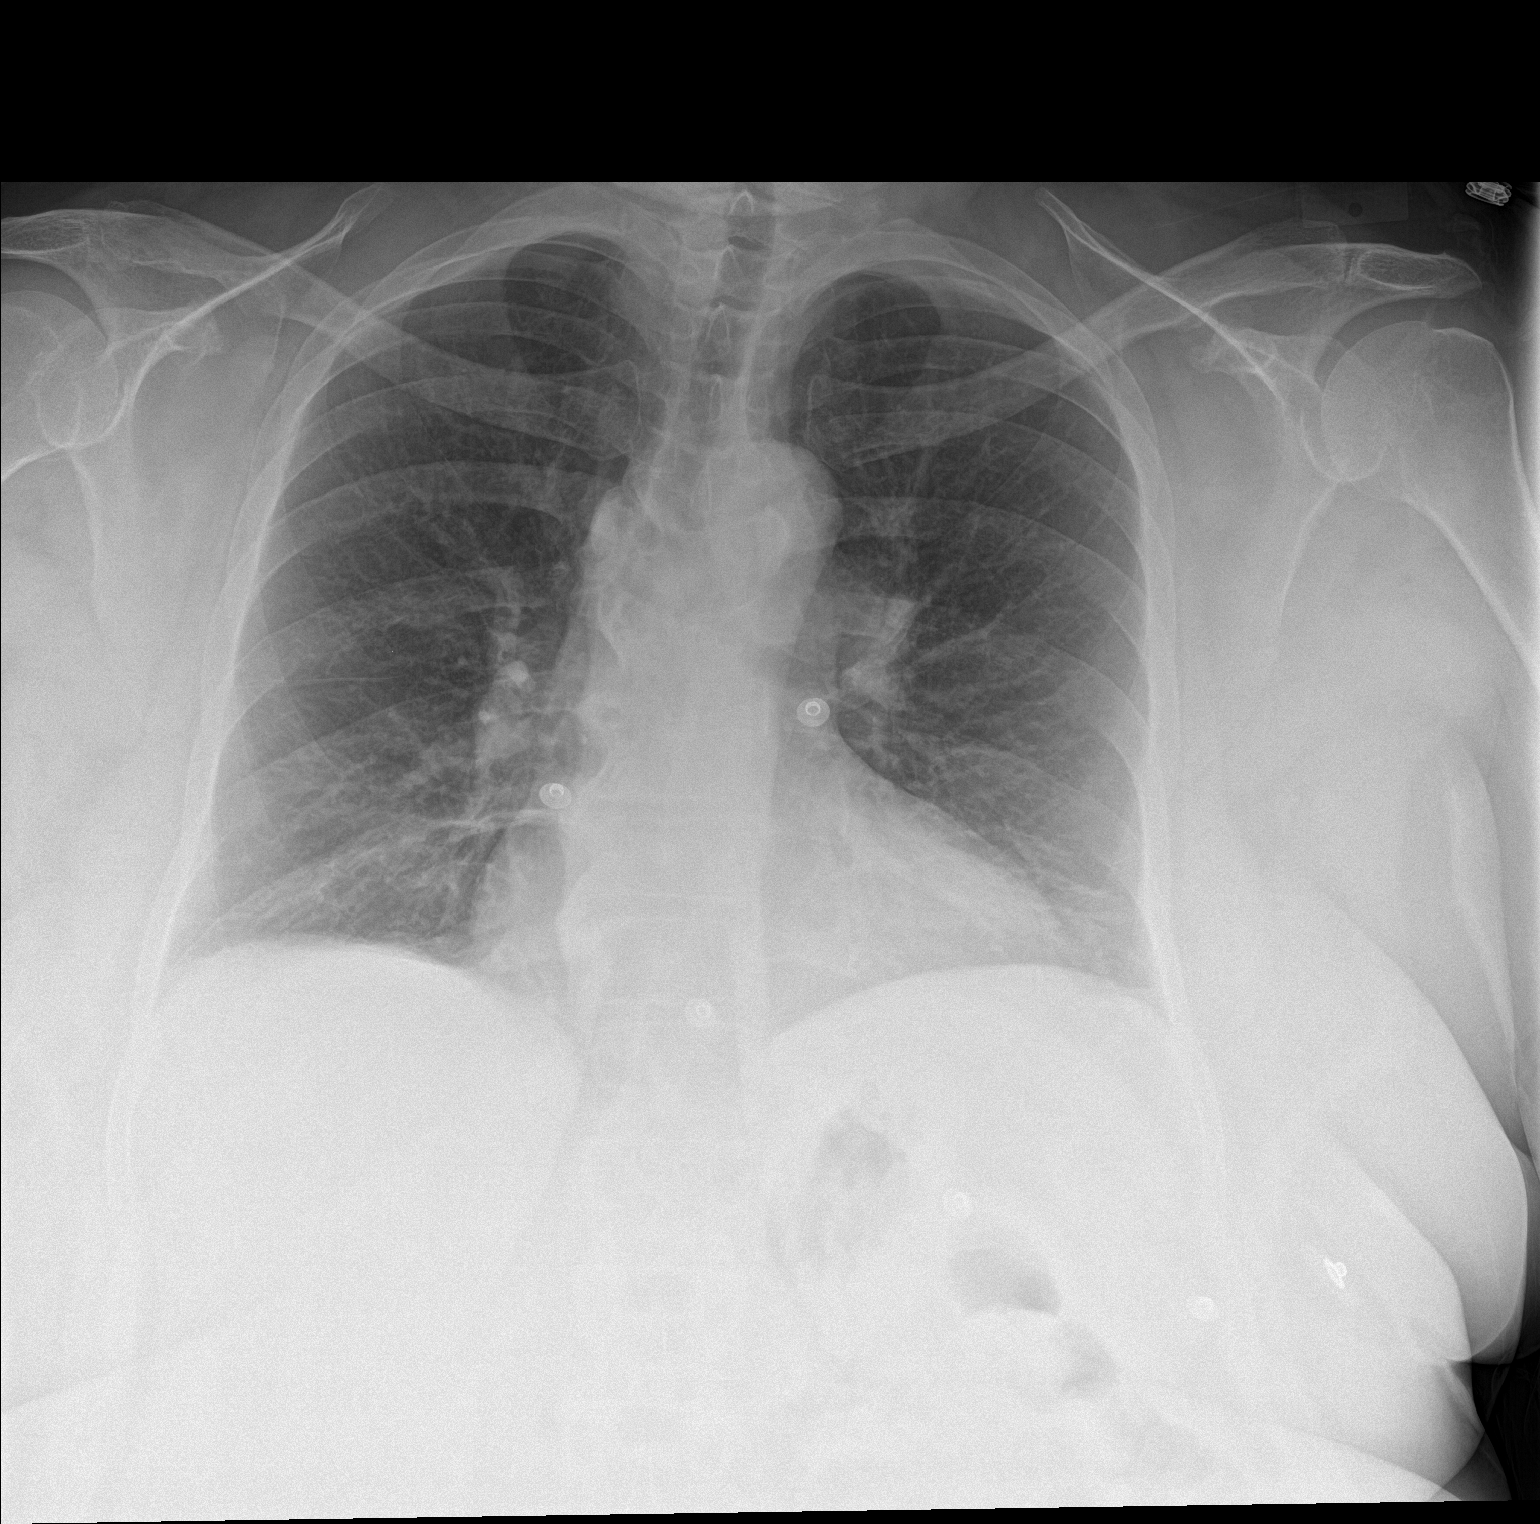

[chest lat]
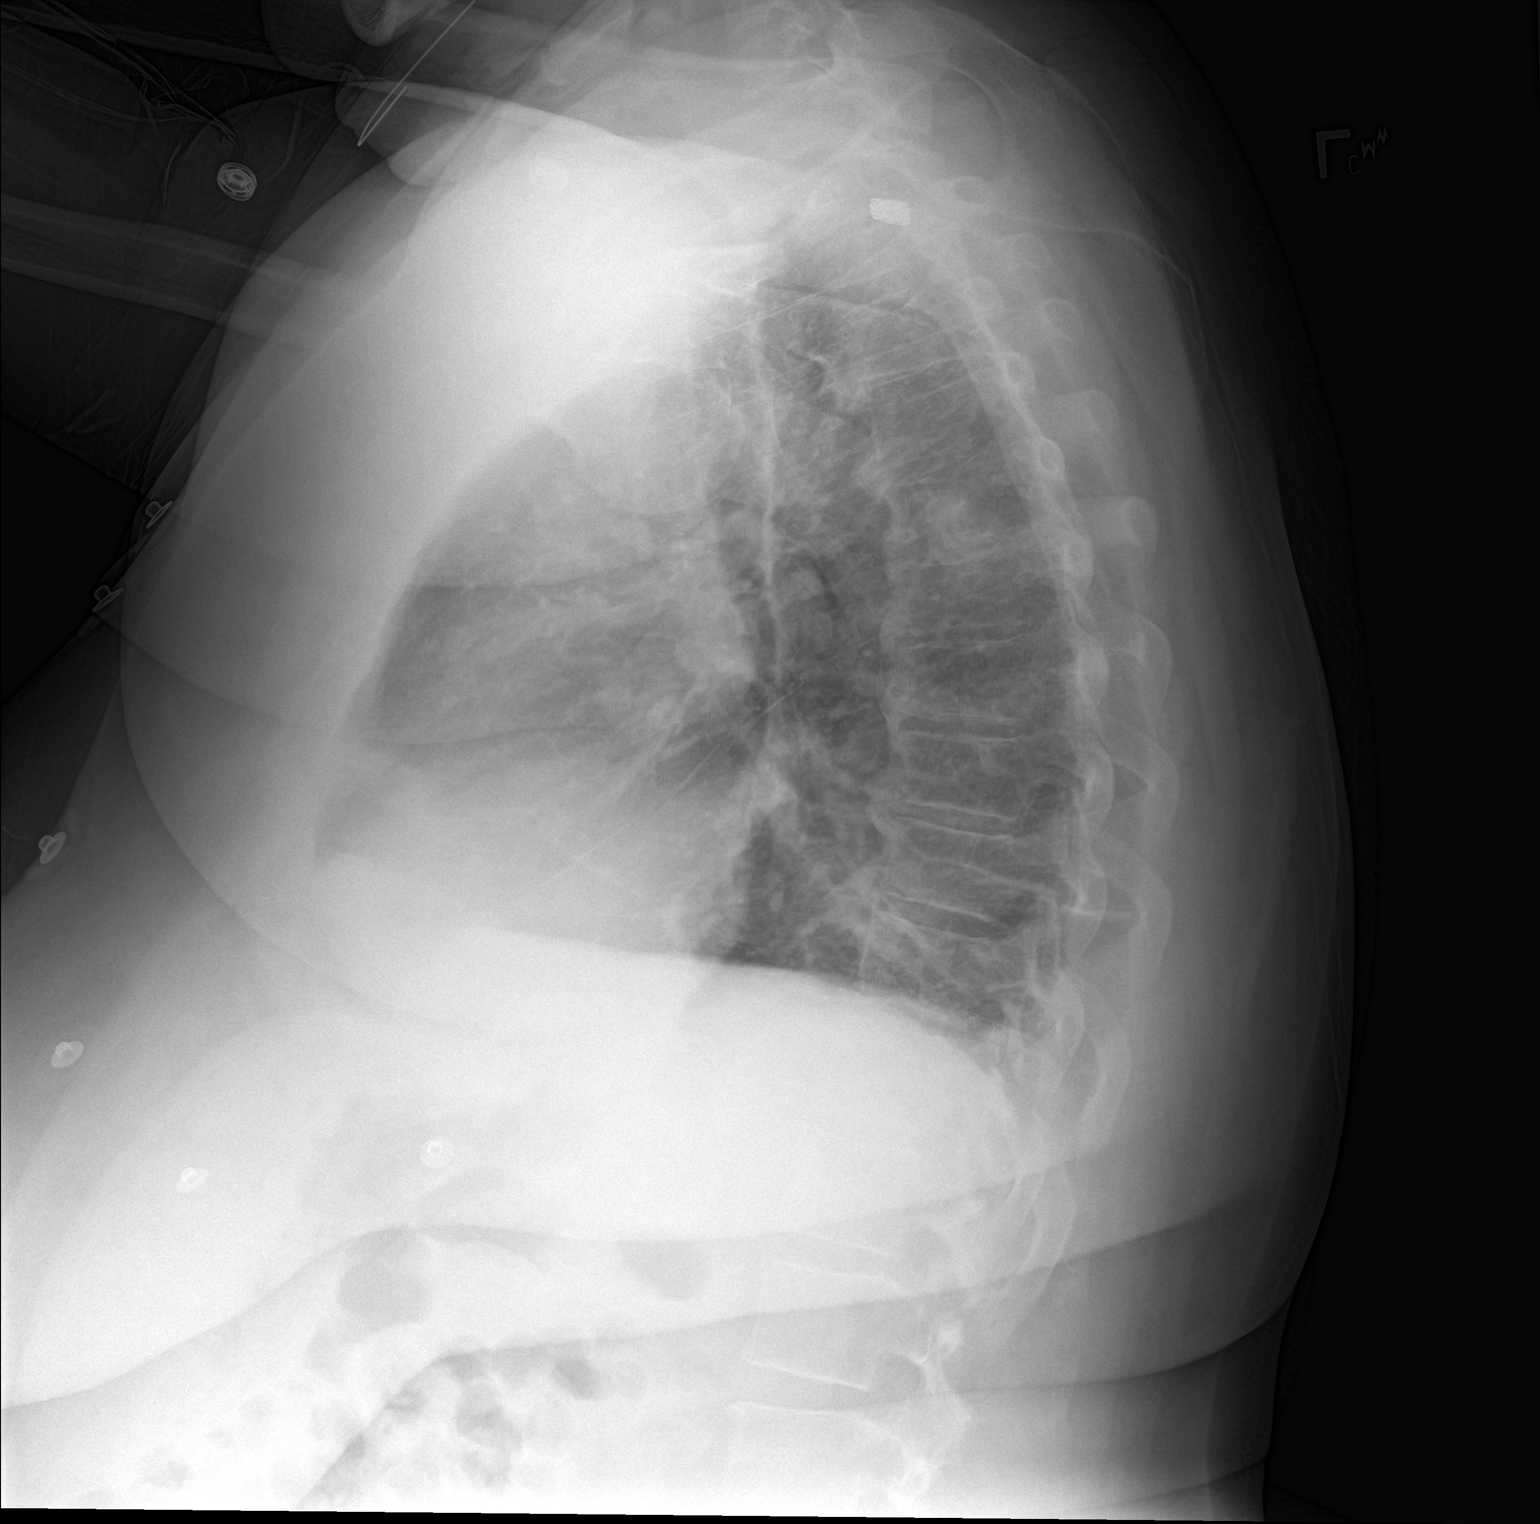

[2 of 2 positions shown; findings below may reference images not displayed]

FINDINGS: There is mild bibasilar atelectasis. Lungs elsewhere are clear.
Heart size and pulmonary vascularity are normal. No adenopathy.
There is degenerative change in the thoracic spine. No pneumothorax.
IMPRESSION: Mild bibasilar atelectatic change.  No edema or consolidation.

## 2018-04-23 ENCOUNTER — Emergency Department (HOSPITAL_COMMUNITY)
Admission: EM | Admit: 2018-04-23 | Discharge: 2018-04-23 | Disposition: A | Discharge status: Home / Self Care | Payer: Medicaid Other | Attending: Emergency Medicine | Admitting: Emergency Medicine

## 2018-04-23 ENCOUNTER — Emergency Department (HOSPITAL_COMMUNITY): Payer: Medicaid Other

## 2018-04-23 ENCOUNTER — Other Ambulatory Visit: Payer: Self-pay

## 2018-04-23 DIAGNOSIS — Z85850 Personal history of malignant neoplasm of thyroid: Secondary | ICD-10-CM | POA: Diagnosis not present

## 2018-04-23 DIAGNOSIS — R600 Localized edema: Secondary | ICD-10-CM | POA: Insufficient documentation

## 2018-04-23 DIAGNOSIS — I10 Essential (primary) hypertension: Secondary | ICD-10-CM | POA: Diagnosis not present

## 2018-04-23 DIAGNOSIS — J441 Chronic obstructive pulmonary disease with (acute) exacerbation: Secondary | ICD-10-CM | POA: Diagnosis not present

## 2018-04-23 DIAGNOSIS — R0789 Other chest pain: Secondary | ICD-10-CM | POA: Insufficient documentation

## 2018-04-23 DIAGNOSIS — R0602 Shortness of breath: Secondary | ICD-10-CM | POA: Diagnosis present

## 2018-04-23 DIAGNOSIS — Z87891 Personal history of nicotine dependence: Secondary | ICD-10-CM | POA: Diagnosis not present

## 2018-04-23 DIAGNOSIS — Z86718 Personal history of other venous thrombosis and embolism: Secondary | ICD-10-CM | POA: Diagnosis not present

## 2018-04-23 DIAGNOSIS — I5032 Chronic diastolic (congestive) heart failure: Secondary | ICD-10-CM | POA: Insufficient documentation

## 2018-04-23 DIAGNOSIS — E119 Type 2 diabetes mellitus without complications: Secondary | ICD-10-CM | POA: Diagnosis not present

## 2018-04-23 LAB — COMPREHENSIVE METABOLIC PANEL
ALK PHOS: 56 U/L (ref 38–126)
ALT: 15 U/L (ref 0–44)
ANION GAP: 11 (ref 5–15)
AST: 20 U/L (ref 15–41)
Albumin: 3.7 g/dL (ref 3.5–5.0)
BUN: 7 mg/dL (ref 6–20)
CALCIUM: 8.7 mg/dL — AB (ref 8.9–10.3)
CO2: 34 mmol/L — AB (ref 22–32)
Chloride: 96 mmol/L — ABNORMAL LOW (ref 98–111)
Creatinine, Ser: 0.78 mg/dL (ref 0.44–1.00)
GFR calc Af Amer: >60 mL/min (ref >60)
GFR calc non Af Amer: >60 mL/min (ref >60)
GLUCOSE: 129 mg/dL — AB (ref 70–99)
Potassium: 4.1 mmol/L (ref 3.5–5.1)
Sodium: 141 mmol/L (ref 135–145)
Total Bilirubin: 0.7 mg/dL (ref 0.3–1.2)
Total Protein: 7.1 g/dL (ref 6.5–8.1)

## 2018-04-23 LAB — CBC
HCT: 38.5 % (ref 36.0–46.0)
Hemoglobin: 11.6 g/dL — ABNORMAL LOW (ref 12.0–15.0)
MCH: 29.7 pg (ref 26.0–34.0)
MCHC: 30.1 g/dL (ref 30.0–36.0)
MCV: 98.7 fL (ref 80.0–100.0)
Platelets: 259 10*3/uL (ref 150–400)
RBC: 3.9 MIL/uL (ref 3.87–5.11)
RDW: 13.4 % (ref 11.5–15.5)
WBC: 16.1 10*3/uL — ABNORMAL HIGH (ref 4.0–10.5)
nRBC: 0.2 % (ref 0.0–0.2)

## 2018-04-23 LAB — BRAIN NATRIURETIC PEPTIDE: B Natriuretic Peptide: 48.2 pg/mL (ref 0.0–100.0)

## 2018-04-23 LAB — INFLUENZA PANEL BY PCR (TYPE A & B)
INFLBPCR: NEGATIVE
Influenza A By PCR: NEGATIVE

## 2018-04-23 LAB — TROPONIN I: Troponin I: <0.03 ng/mL (ref <0.03)

## 2018-04-23 MED ORDER — IPRATROPIUM-ALBUTEROL 0.5-2.5 (3) MG/3ML IN SOLN
3.0000 mL | Freq: Once | RESPIRATORY_TRACT | Status: AC
  Administered 2018-04-23: 3 mL via RESPIRATORY_TRACT
  Filled 2018-04-23: qty 3

## 2018-04-23 MED ORDER — PREDNISONE 20 MG PO TABS: 40.0000 mg | ORAL_TABLET | Freq: Every day | ORAL | 0 refills | Status: AC

## 2018-04-23 MED ORDER — HYDROCODONE-ACETAMINOPHEN 5-325 MG PO TABS
1.0000 | ORAL_TABLET | Freq: Once | ORAL | Status: AC
  Administered 2018-04-23: 1 via ORAL
  Filled 2018-04-23: qty 1

## 2018-04-23 MED ORDER — ALBUTEROL SULFATE HFA 108 (90 BASE) MCG/ACT IN AERS: 1.0000 | INHALATION_SPRAY | Freq: Four times a day (QID) | RESPIRATORY_TRACT | 0 refills | Status: DC | PRN

## 2018-04-23 MED ORDER — ALBUTEROL SULFATE (2.5 MG/3ML) 0.083% IN NEBU: 5.0000 mg | INHALATION_SOLUTION | Freq: Once | RESPIRATORY_TRACT | Status: DC

## 2018-04-23 MED ORDER — AZITHROMYCIN 250 MG PO TABS: 250.0000 mg | ORAL_TABLET | Freq: Every day | ORAL | 0 refills | Status: DC

## 2018-04-23 MED ORDER — FUROSEMIDE 10 MG/ML IJ SOLN
80.0000 mg | Freq: Once | INTRAMUSCULAR | Status: AC
  Administered 2018-04-23: 80 mg via INTRAVENOUS
  Filled 2018-04-23: qty 8

## 2018-04-23 MED ORDER — ALBUTEROL SULFATE HFA 108 (90 BASE) MCG/ACT IN AERS
2.0000 | INHALATION_SPRAY | Freq: Once | RESPIRATORY_TRACT | Status: AC
  Administered 2018-04-23: 2 via RESPIRATORY_TRACT
  Filled 2018-04-23: qty 6.7

## 2018-04-23 MED ORDER — AZITHROMYCIN 250 MG PO TABS
500.0000 mg | ORAL_TABLET | Freq: Once | ORAL | Status: AC
  Administered 2018-04-23: 500 mg via ORAL
  Filled 2018-04-23: qty 2

## 2018-04-23 MED ORDER — PREDNISONE 20 MG PO TABS
40.0000 mg | ORAL_TABLET | Freq: Once | ORAL | Status: AC
  Administered 2018-04-23: 40 mg via ORAL
  Filled 2018-04-23: qty 2

## 2018-04-23 NOTE — ED Notes (Signed)
Please note: I-Stat drawn and put on rocker in [facility].

## 2018-04-23 NOTE — ED Provider Notes (Signed)
Emergency Department Provider Note   I have reviewed the triage vital signs and the nursing notes.   HISTORY  Chief Complaint Shortness of Breath   HPI Michele Owens is a 58 y.o. female with PMH of COPD, CHF (EF 60-65%), DM, GERD, arthritis, and HTN presents to the emergency department for evaluation of progressively worsening dyspnea and wheezing at home.  Patient states that symptoms have been progressively worsening over the past 3 weeks.  She has had some associated chest tightness that is worse with coughing but has also been present for "months" and is unchanged today.  Pain does radiate around to the back laterally.  She is having a productive cough with some brown sputum.  She describes feeling "hot and cold" has not recorded any fevers at home.  She has been using her nebulizer machine daily and often multiple times per day.  She is currently taking Lasix 100 mg tablets every morning but has not noticed a significant increase in her urine output. Her PCP id Dr. Jonelle Sidle.  He states that she is also noticed increased swelling in her bilateral lower extremities and that she is finding it increasingly uncomfortable to lay down for sleep.  Over the past week she has been sleeping in her recliner for comfort.    Past Medical History:  Diagnosis Date  . Anxiety   . Arthritis   . Asthma   . Chronic diastolic CHF (congestive heart failure) ([place])   . COPD (chronic obstructive pulmonary disease) (H[facility])    Uses Oxygen at night  . Depression   . Diabetes mellitus without complication ([place])   . Dyspnea    occ  . Family history of thyroid cancer   . GERD (gastroesophageal reflux disease)   . Gout   . Headache    migraines  . History of DVT of lower extremity   . History of nuclear stress test    Myoview 2/17:  Low risk stress nuclear study with a small, moderate intensity, partially reversible inferior lateral defect consistent with small prior infarct and minimal peri-infarct ischemia;  EF 68 with normal wall motion  . Hypertension   . Pneumonia   . Thyroid cancer ([place]) 09/23/2016    Patient Active Problem List   Diagnosis Date Noted  . Hypertensive disorder 08/28/2017  . Obesity 08/28/2017  . COPD exacerbation ([place]) 08/04/2017  . Acute and chronic respiratory failure with hypercapnia ([place])   . Acute on chronic diastolic heart failure ([place])   . Pulmonary HTN ([place])   . Genetic testing 02/27/2017  . Family history of thyroid cancer   . Hypomagnesemia 10/03/2016  . Boil, breast 10/03/2016  . Atypical chest pain   . HLD (hyperlipidemia) 09/23/2016  . COPD (chronic obstructive pulmonary disease) ([place]) 09/23/2016  . Gout 09/23/2016  . DVT (deep vein thrombosis) in pregnancy 09/23/2016  . Thyroid cancer ([place]) 09/23/2016  . Hypocalcemia 09/23/2016  . AKI (acute kidney injury) ([place]) 09/23/2016  . 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 Acute pulmonary edema (H[facility])   . Acute hypoxemic respiratory failure ([place])   . Acute on chronic respiratory failure with hypoxemia ([place])   . Ventilator dependent ([place])   . Respiratory failure ([place]) 07/16/2016  . Chronic combined systolic (congestive) and diastolic (congestive) heart failure ([place]) 02/10/2016  . Dyspnea   . CAP (community acquired pneumonia) 01/23/2016  . 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 Pulmonary hypertension ([place]) 01/23/2016  . Sepsis ([place]) 01/23/2016  .  Obesity hypoventilation syndrome ([place]) 08/26/2015  . Chest pain 06/16/2015  . Dyslipidemia associated with type 2 diabetes mellitus ([place]) 04/24/2015  . Leukocytosis 04/24/2015  . Controlled type 2 diabetes mellitus without complication, without Alioune Hodgkin-term current use of insulin ([place]) 04/24/2015  . Anxiety 04/24/2015  . Benign essential HTN 04/24/2015  . Normocytic anemia 04/24/2015  . Asthma with COPD with exacerbation ([place]) 04/24/2015  . OSA (obstructive sleep apnea) 05/10/2012  . Tobacco abuse 07/04/2011     Past Surgical History:  Procedure Laterality Date  . BUNIONECTOMY Bilateral   . CARDIAC CATHETERIZATION N/A 06/23/2015   Procedure: Right/Left Heart Cath and Coronary Angiography;  Surgeon: [name], MD;  Location: [facility];  Service: Cardiovascular;  Laterality: N/A;  . COLONOSCOPY WITH PROPOFOL N/A 12/15/2015   Procedure: COLONOSCOPY WITH PROPOFOL;  Surgeon: [name], MD;  Location: [facility];  Service: Endoscopy;  Laterality: N/A;  . THYROIDECTOMY  09/19/2016  . THYROIDECTOMY N/A 09/19/2016   Procedure: TOTAL THYROIDECTOMY;  Surgeon: [name], MD;  Location: [facility];  Service: General;  Laterality: N/A;  . TONSILLECTOMY    . TOTAL ABDOMINAL HYSTERECTOMY  07/14/10    Allergies Bee venom; Fioricet [butalbital-apap-caffeine]; Ibuprofen; Lamisil [terbinafine]; and Nsaids  Family History  Problem Relation Age of Onset  . Cancer Father        thought to be due to exposure to concrete  . Diabetes Mother   . Thyroid cancer Mother        dx in her 46s-60s  . Cancer Maternal Uncle        2 uncles with cancer NOS  . Brain cancer Paternal Aunt   . Cancer Cousin        maternal first cousin - NOS  . Cancer Cousin        maternal first cousin - NOS    Social History Social History   Tobacco Use  . Smoking status: Former Smoker    Packs/day: 0.50    Years: 41.00    Pack years: 20.50    Types: Cigarettes    Last attempt to quit: 07/2016    Years since quitting: 1.7  . Smokeless tobacco: Never Used  . Tobacco comment: Starting to Wean Off -- using Nicotine Patch  Substance Use Topics  . Alcohol use: Not Currently    Alcohol/week: 0.0 standard drinks    Comment: ? none now  . Drug use: Never    Comment: none for Niccolas Loeper time    Review of Systems  Constitutional: No fever/chills Eyes: No visual changes. ENT: No sore throat. Cardiovascular: Positive chest pain. Respiratory: Positive shortness of breath and productive cough.  Gastrointestinal: No  abdominal pain. No nausea, no vomiting. No diarrhea. No constipation. Genitourinary: Negative for dysuria. Musculoskeletal: Positive chronic back and bilateral knee pain.  Skin: Negative for rash. Neurological: Negative for headaches, focal weakness or numbness.  10-point ROS otherwise negative.  ____________________________________________   PHYSICAL EXAM:  VITAL SIGNS: Vitals:   04/23/18 1645  BP: (!) 148/78  Pulse: 80  SpO2: 90%    Constitutional: Alert and oriented. Well appearing and in no acute distress. Eyes: Conjunctivae are normal.  Head: Atraumatic. Nose: No congestion/rhinnorhea. Mouth/Throat: Mucous membranes are moist.   Neck: No stridor.  Cardiovascular: Normal rate, regular rhythm. Good peripheral circulation. Grossly normal heart sounds.   Respiratory: Increased respiratory effort.  No retractions. Lungs with poor air entry and end-expiratory wheezing on exam. Diminished sounds at the bases bilaterally.  Gastrointestinal: Soft and nontender. No distention.  Musculoskeletal: No lower extremity tenderness. 2+ pitting edema in the bilateral LEs No gross deformities of extremities. Neurologic:  Normal speech and language. No gross focal neurologic deficits are appreciated.  Skin:  Skin is warm, dry and intact. No rash noted.  ____________________________________________   LABS (all labs ordered are listed, but only abnormal results are displayed)  Labs Reviewed  CBC - Abnormal; Notable for the following components:      Result Value   WBC 16.1 (*)    Hemoglobin 11.6 (*)    All other components within normal limits  COMPREHENSIVE METABOLIC PANEL - Abnormal; Notable for the following components:   Chloride 96 (*)    CO2 34 (*)    Glucose, Bld 129 (*)    Calcium 8.7 (*)    All other components within normal limits  BRAIN NATRIURETIC PEPTIDE  TROPONIN I  INFLUENZA PANEL BY PCR (TYPE A & B)   ____________________________________________  EKG   EKG  Interpretation  Date/Time:  Tuesday April 23 2018 14:14:19 EST Ventricular Rate:  94 PR Interval:  164 QRS Duration: 68 QT Interval:  378 QTC Calculation: 472 R Axis:   92 Text Interpretation:  Sinus rhythm with Premature atrial complexes Rightward axis Borderline ECG No STEMI.  Confirmed by Nanda Quinton [phone]) on 04/23/2018 2:59:42 PM       ____________________________________________  RADIOLOGY  Dg Chest 2 View  Result Date: 04/23/2018 CLINICAL DATA:  Shortness of breath for 3 weeks. EXAM: CHEST - 2 VIEW COMPARISON:  PA and lateral chest 01/31/2018, 04/17/2017 and 01/06/2017. FINDINGS: There is cardiomegaly and interstitial pulmonary edema. Streaky basilar subsegmental atelectasis or scar is more notable the left and chronic. No pneumothorax or pleural fluid. No acute or focal bony abnormality. IMPRESSION: Cardiomegaly and interstitial edema. No change in bilateral streaky atelectasis or scar. Electronically Signed   By: Inge Rise M.D.   On: 04/23/2018 14:56    ____________________________________________   PROCEDURES  Procedure(s) performed:   Procedures  None ____________________________________________   INITIAL IMPRESSION / ASSESSMENT AND PLAN / ED COURSE  Pertinent labs & imaging results that were available during my care of the patient were reviewed by me and considered in my medical decision making (see chart for details).  Patient presents to the emergency department for evaluation of cough and shortness of breath which is progressively worsening over the past 3 weeks.  Her breathing symptoms are likely multifactorial including elevated BMI, COPD, and diastolic CHF.  She has components of her history and exam which support a multifactorial diagnosis.  She does have increased work of breathing on my exam. Plan for nebulizer and Lasix here. BNP is normal but CXR with some vascular congestion. No infiltrate for clear PNA. Following Troponin will reassess after  treatment. Patient on 2L [place] at baseline.   06:00 PM Feeling much better after DuoNeb.  No increased oxygen requirement from her home oxygen.  She has been urinating here with IV Lasix.  Negative flu.  Troponin negative.  Do not feel the patient would benefit from troponin trending with several weeks of ongoing chest pain.  Advise close follow-up with her primary care physician.  Patient was given treatment in the emergency department for COPD exacerbation.  Plan for steroid burst over the next several days.  Patient feels comfortable with her breathing status and plan for discharge.  She plans to call her PCP in the morning and return to the emergency department with any new or worsening symptoms. ____________________________________________  FINAL CLINICAL IMPRESSION(S) /  ED DIAGNOSES  Final diagnoses:  COPD exacerbation ([place])  SOB (shortness of breath)     MEDICATIONS GIVEN DURING THIS VISIT:  Medications  azithromycin (ZITHROMAX) tablet 500 mg (has no administration in time range)  albuterol (PROVENTIL HFA;VENTOLIN HFA) 108 (90 Base) MCG/ACT inhaler 2 puff (has no administration in time range)  predniSONE (DELTASONE) tablet 40 mg (has no administration in time range)  ipratropium-albuterol (DUONEB) 0.5-2.5 (3) MG/3ML nebulizer solution 3 mL (3 mLs Nebulization Given 04/23/18 1632)  furosemide (LASIX) injection 80 mg (80 mg Intravenous Given 04/23/18 1643)  HYDROcodone-acetaminophen (NORCO/VICODIN) 5-325 MG per tablet 1 tablet (1 tablet Oral Given 04/23/18 1632)     NEW OUTPATIENT MEDICATIONS STARTED DURING THIS VISIT:  New Prescriptions   ALBUTEROL (PROVENTIL HFA;VENTOLIN HFA) 108 (90 BASE) MCG/ACT INHALER    Inhale 1-2 puffs into the lungs every 6 (six) hours as needed for wheezing or shortness of breath.   AZITHROMYCIN (ZITHROMAX) 250 MG TABLET    Take 1 tablet (250 mg total) by mouth daily. Take first 2 tablets together, then 1 every day until finished.   PREDNISONE (DELTASONE) 20  MG TABLET    Take 2 tablets (40 mg total) by mouth daily for 4 days.    Note:  This document was prepared using Dragon voice recognition software and may include unintentional dictation errors.  Nanda Quinton, MD Emergency Medicine    Lemoyne Nestor, [name], MD 04/23/18 [phone]

## 2018-04-23 NOTE — ED Triage Notes (Signed)
Pt to ED via EMS with sob x 3 weeks. Endorses productive cough (brown phlem). Pt Wears 2L at home. Endorses chest pain present with coughing and deep breaths. Hx COPD, CHF.

## 2018-04-23 NOTE — Discharge Instructions (Signed)
We believe that your symptoms are caused today by an exacerbation of your COPD, and possibly bronchitis.  Please take the prescribed medications and any medications that you have at home for your COPD.  Follow up with your doctor as recommended.  If you develop any new or worsening symptoms, including but not limited to fever, persistent vomiting, worsening shortness of breath, or other symptoms that concern you, please return to the Emergency Department immediately. ° ° °Chronic Obstructive Pulmonary Disease °Chronic obstructive pulmonary disease (COPD) is a common lung condition in which airflow from the lungs is limited. COPD is a general term that can be used to describe many different lung problems that limit airflow, including both chronic bronchitis and emphysema.  If you have COPD, your lung function will probably never return to normal, but there are measures you can take to improve lung function and make yourself feel better.  °CAUSES  °Smoking (common).   °Exposure to secondhand smoke.   °Genetic problems. °Chronic inflammatory lung diseases or recurrent infections. °SYMPTOMS  °Shortness of breath, especially with physical activity.   °Deep, persistent (chronic) cough with a large amount of thick mucus.   °Wheezing.   °Rapid breaths (tachypnea).   °Gray or bluish discoloration (cyanosis) of the skin, especially in fingers, toes, or lips.   °Fatigue.   °Weight loss.   °Frequent infections or episodes when breathing symptoms become much worse (exacerbations).   °Chest tightness. °DIAGNOSIS  °Your health care provider will take a medical history and perform a physical examination to make the initial diagnosis.  Additional tests for COPD may include:  °Lung (pulmonary) function tests. °Chest X-ray. °CT scan. °Blood tests. °TREATMENT  °Treatment available to help you feel better when you have COPD includes:  °Inhaler and nebulizer medicines. These help manage the symptoms of COPD and make your breathing more  comfortable. °Supplemental oxygen. Supplemental oxygen is only helpful if you have a low oxygen level in your blood.   °Exercise and physical activity. These are beneficial for nearly all people with COPD. Some people may also benefit from a pulmonary rehabilitation program. °HOME CARE INSTRUCTIONS  °Take all medicines (inhaled or pills) as directed by your health care provider. °Avoid over-the-counter medicines or cough syrups that dry up your airway (such as antihistamines) and slow down the elimination of secretions unless instructed otherwise by your health care provider.   °If you are a smoker, the most important thing that you can do is stop smoking. Continuing to smoke will cause further lung damage and breathing trouble. Ask your health care provider for help with quitting smoking. He or she can direct you to community resources or hospitals that provide support. °Avoid exposure to irritants such as smoke, chemicals, and fumes that aggravate your breathing. °Use oxygen therapy and pulmonary rehabilitation if directed by your health care provider. If you require home oxygen therapy, ask your health care provider whether you should purchase a pulse oximeter to measure your oxygen level at home.   °Avoid contact with individuals who have a contagious illness. °Avoid extreme temperature and humidity changes. °Eat healthy foods. Eating smaller, more frequent meals and resting before meals may help you maintain your strength. °Stay active, but balance activity with periods of rest. Exercise and physical activity will help you maintain your ability to do things you want to do. °Preventing infection and hospitalization is very important when you have COPD. Make sure to receive all the vaccines your health care provider recommends, especially the pneumococcal and influenza   vaccines. Ask your health care provider whether you need a pneumonia vaccine. °Learn and use relaxation techniques to manage stress. °Learn and  use controlled breathing techniques as directed by your health care provider. Controlled breathing techniques include:   °Pursed lip breathing. Start by breathing in (inhaling) through your nose for 1 second. Then, purse your lips as if you were going to whistle and breathe out (exhale) through the pursed lips for 2 seconds.   °Diaphragmatic breathing. Start by putting one hand on your abdomen just above your waist. Inhale slowly through your nose. The hand on your abdomen should move out. Then purse your lips and exhale slowly. You should be able to feel the hand on your abdomen moving in as you exhale.   °Learn and use controlled coughing to clear mucus from your lungs. Controlled coughing is a series of short, progressive coughs. The steps of controlled coughing are:   °Lean your head slightly forward.   °Breathe in deeply using diaphragmatic breathing.   °Try to hold your breath for 3 seconds.   °Keep your mouth slightly open while coughing twice.   °Spit any mucus out into a tissue.   °Rest and repeat the steps once or twice as needed. °SEEK MEDICAL CARE IF:  °You are coughing up more mucus than usual.   °There is a change in the color or thickness of your mucus.   °Your breathing is more labored than usual.   °Your breathing is faster than usual.   °SEEK IMMEDIATE MEDICAL CARE IF:  °You have shortness of breath while you are resting.   °You have shortness of breath that prevents you from: °Being able to talk.   °Performing your usual physical activities.   °You have chest pain lasting longer than 5 minutes.   °Your skin color is more cyanotic than usual. °You measure low oxygen saturations for longer than 5 minutes with a pulse oximeter. °MAKE SURE YOU:  °Understand these instructions. °Will watch your condition. °Will get help right away if you are not doing well or get worse. °Document Released: 01/04/2005 Document Revised: 08/11/2013 Document Reviewed: 11/21/2012 °[employer]® Patient Information ©2015  [employer]. This information is not intended to replace advice given to you by your health care provider. Make sure you discuss any questions you have with your health care provider. ° °

## 2018-05-02 ENCOUNTER — Ambulatory Visit: Payer: Medicaid Other | Admitting: Podiatry

## 2018-05-08 ENCOUNTER — Ambulatory Visit (INDEPENDENT_AMBULATORY_CARE_PROVIDER_SITE_OTHER): Payer: Medicaid Other | Admitting: Podiatry

## 2018-05-08 ENCOUNTER — Encounter: Payer: Self-pay | Admitting: Podiatry

## 2018-05-08 DIAGNOSIS — M79674 Pain in right toe(s): Secondary | ICD-10-CM

## 2018-05-08 DIAGNOSIS — M79675 Pain in left toe(s): Secondary | ICD-10-CM

## 2018-05-08 DIAGNOSIS — B351 Tinea unguium: Secondary | ICD-10-CM

## 2018-05-08 NOTE — Patient Instructions (Addendum)

## 2018-05-08 NOTE — Progress Notes (Signed)
Subjective: Michele Owens presents today with history of neuropathy with cc of painful, mycotic toenails.  Pain is aggravated when wearing enclosed shoe gear and relieved with periodic professional debridement.  Patient has h/o diabetic neuropathy.  Patient states she should be receiving her diabetic shoes any day now.  She is very sad today as she has lost her nephew recently.  Objective:  Vascular Examination: Capillary refill time <4 seconds x 10 Dorsalis pedis and Posterior tibial pulses 1/4 b/l Digital hair x 10 digits was absent  Skin temperature gradient WNL b/l Edema BLE  Dermatological Examination: No open wounds b/l  No interdigital macerations b/l  Anonychia b/l great toes and right 2nd digit with epithelialization   Toenails 3-5 right , 2-5 left discolored, thick, dystrophic with subungual debris and pain with palpation to nailbeds due to thickness of nails.  Musculoskeletal: Muscle strength 5/5 b/l LE Mild contractures 2-5 b/l Minimal tenderness to medial tubercle b/l heels  Neurological: Sensation with 10 gram monofilament. Vibratory sensation   Assessment: 1. Painful onychomycosis toenails 1-5 b/l 2. NIDDM with diabetic neuropathy  Plan: 1. Toenails 1-5 b/l were debrided in length and girth without iatrogenic bleeding. 2. She is expecting her diabetic shoes any day now and she is to bring them to her next appointment. 3. Patient to continue soft, supportive shoe gear 4. Patient to report any pedal injuries to medical professional  5. Follow up 3 months. Patient/POA to call should there be a concern in the interim.

## 2018-06-09 ENCOUNTER — Emergency Department (HOSPITAL_COMMUNITY)
Admission: EM | Admit: 2018-06-09 | Discharge: 2018-06-10 | Disposition: A | Discharge status: Home / Self Care | Payer: Medicaid Other | Attending: Emergency Medicine | Admitting: Emergency Medicine

## 2018-06-09 ENCOUNTER — Emergency Department (HOSPITAL_COMMUNITY): Payer: Medicaid Other

## 2018-06-09 ENCOUNTER — Encounter (HOSPITAL_COMMUNITY): Payer: Self-pay

## 2018-06-09 ENCOUNTER — Other Ambulatory Visit: Payer: Self-pay

## 2018-06-09 DIAGNOSIS — I110 Hypertensive heart disease with heart failure: Secondary | ICD-10-CM | POA: Insufficient documentation

## 2018-06-09 DIAGNOSIS — Z7984 Long term (current) use of oral hypoglycemic drugs: Secondary | ICD-10-CM | POA: Diagnosis not present

## 2018-06-09 DIAGNOSIS — J449 Chronic obstructive pulmonary disease, unspecified: Secondary | ICD-10-CM | POA: Insufficient documentation

## 2018-06-09 DIAGNOSIS — I5032 Chronic diastolic (congestive) heart failure: Secondary | ICD-10-CM | POA: Diagnosis not present

## 2018-06-09 DIAGNOSIS — E119 Type 2 diabetes mellitus without complications: Secondary | ICD-10-CM | POA: Diagnosis not present

## 2018-06-09 DIAGNOSIS — Z87891 Personal history of nicotine dependence: Secondary | ICD-10-CM | POA: Diagnosis not present

## 2018-06-09 DIAGNOSIS — Z79899 Other long term (current) drug therapy: Secondary | ICD-10-CM | POA: Insufficient documentation

## 2018-06-09 DIAGNOSIS — R0600 Dyspnea, unspecified: Secondary | ICD-10-CM | POA: Diagnosis not present

## 2018-06-09 DIAGNOSIS — Z85850 Personal history of malignant neoplasm of thyroid: Secondary | ICD-10-CM | POA: Diagnosis not present

## 2018-06-09 DIAGNOSIS — R079 Chest pain, unspecified: Secondary | ICD-10-CM | POA: Diagnosis present

## 2018-06-09 LAB — TROPONIN I

## 2018-06-09 LAB — CBC WITH DIFFERENTIAL/PLATELET
Abs Immature Granulocytes: 0.14 10*3/uL — ABNORMAL HIGH (ref 0.00–0.07)
BASOS PCT: 0 %
Basophils Absolute: 0.1 10*3/uL (ref 0.0–0.1)
EOS ABS: 0.2 10*3/uL (ref 0.0–0.5)
EOS PCT: 1 %
HEMATOCRIT: 39.3 % (ref 36.0–46.0)
HEMOGLOBIN: 11.7 g/dL — AB (ref 12.0–15.0)
IMMATURE GRANULOCYTES: 1 %
Lymphocytes Relative: 22 %
Lymphs Abs: 3.6 10*3/uL (ref 0.7–4.0)
MCH: 28.1 pg (ref 26.0–34.0)
MCHC: 29.8 g/dL — ABNORMAL LOW (ref 30.0–36.0)
MCV: 94.5 fL (ref 80.0–100.0)
MONO ABS: 1.1 10*3/uL — AB (ref 0.1–1.0)
MONOS PCT: 7 %
NEUTROS PCT: 69 %
Neutro Abs: 11.1 10*3/uL — ABNORMAL HIGH (ref 1.7–7.7)
PLATELETS: 239 10*3/uL (ref 150–400)
RBC: 4.16 MIL/uL (ref 3.87–5.11)
RDW: 12.9 % (ref 11.5–15.5)
WBC: 16.2 10*3/uL — ABNORMAL HIGH (ref 4.0–10.5)
nRBC: 0 % (ref 0.0–0.2)

## 2018-06-09 LAB — BASIC METABOLIC PANEL
Anion gap: 13 (ref 5–15)
BUN: 8 mg/dL (ref 6–20)
CO2: 22 mmol/L (ref 22–32)
Calcium: 8.2 mg/dL — ABNORMAL LOW (ref 8.9–10.3)
Chloride: 98 mmol/L (ref 98–111)
Creatinine, Ser: 0.54 mg/dL (ref 0.44–1.00)
GFR calc Af Amer: >60 mL/min (ref >60)
GFR calc non Af Amer: >60 mL/min (ref >60)
Glucose, Bld: 121 mg/dL — ABNORMAL HIGH (ref 70–99)
Potassium: 3.6 mmol/L (ref 3.5–5.1)
Sodium: 133 mmol/L — ABNORMAL LOW (ref 135–145)

## 2018-06-09 LAB — BRAIN NATRIURETIC PEPTIDE: B Natriuretic Peptide: 14.6 pg/mL (ref 0.0–100.0)

## 2018-06-09 LAB — INFLUENZA PANEL BY PCR (TYPE A & B)
INFLAPCR: NEGATIVE
INFLBPCR: NEGATIVE

## 2018-06-09 MED ORDER — FENTANYL CITRATE (PF) 100 MCG/2ML IJ SOLN
50.0000 ug | Freq: Once | INTRAMUSCULAR | Status: AC
  Administered 2018-06-09: 50 ug via INTRAVENOUS
  Filled 2018-06-09: qty 2

## 2018-06-09 MED ORDER — IOHEXOL 350 MG/ML SOLN
100.0000 mL | Freq: Once | INTRAVENOUS | Status: AC | PRN
  Administered 2018-06-09: 100 mL via INTRAVENOUS

## 2018-06-09 MED ORDER — LORAZEPAM 2 MG/ML IJ SOLN
0.5000 mg | Freq: Once | INTRAMUSCULAR | Status: AC
  Administered 2018-06-09: 0.5 mg via INTRAVENOUS
  Filled 2018-06-09: qty 1

## 2018-06-09 MED ORDER — FUROSEMIDE 10 MG/ML IJ SOLN
60.0000 mg | Freq: Once | INTRAMUSCULAR | Status: AC
  Administered 2018-06-09: 60 mg via INTRAVENOUS
  Filled 2018-06-09: qty 6

## 2018-06-09 NOTE — ED Notes (Signed)
Patient transported to CT 

## 2018-06-09 NOTE — ED Triage Notes (Signed)
Per [facility], pt from home w/ a c/o left sided chest pain for 6 days, SOB, and BLE for 2 weeks. Lung sounds noted to be diminished at home and an albuterol tx was initiated. She's had a total of 10 mg of Albuterol, 0.5 of Atrovent, 125 mg of Solumedrol, 324 mg ASA, 4 mg of Zofran, 250 mL of NaCl, and 1 nitro. Pt's BP dropped to 71/38 after one nitro.   130/80 96% nebulized HR 85 RR 22

## 2018-06-09 NOTE — Discharge Instructions (Signed)
Your lungs have decreased fluid You appear to have adequate oxygen levels Please continue your home medications and call your doctor tomorrow for recheck.

## 2018-06-09 NOTE — ED Provider Notes (Signed)
[facility] EMERGENCY DEPARTMENT Provider Note   CSN: [id number] Arrival date & time: 06/09/18  2055    History   Chief Complaint Chief Complaint  Patient presents with  . Chest Pain  . Shortness of Breath    HPI Michele Owens is a 58 y.o. female.     HPI  58 year old female history of COPD, gout, DVT presents today complaining of cough productive of yellow sputum, chest pain, fever, and chills present for the past 1/2 weeks.  She describes the pain is in the substernal area and is sharp and severe in nature.  Is been present constantly.  She has attempted over-the-counter medications without relief.  She denies nausea or vomiting.  Past Medical History:  Diagnosis Date  . Anxiety   . Arthritis   . Asthma   . Chronic diastolic CHF (congestive heart failure) ([place])   . COPD (chronic obstructive pulmonary disease) (HCC)    Uses Oxygen at night  . Depression   . Diabetes mellitus without complication ([place])   . Dyspnea    occ  . Family history of thyroid cancer   . GERD (gastroesophageal reflux disease)   . Gout   . Headache    migraines  . History of DVT of lower extremity   . History of nuclear stress test    Myoview 2/17:  Low risk stress nuclear study with a small, moderate intensity, partially reversible inferior lateral defect consistent with small prior infarct and minimal peri-infarct ischemia; EF 68 with normal wall motion  . Hypertension   . Pneumonia   . Thyroid cancer ([place]) 09/23/2016    Patient Active Problem List   Diagnosis Date Noted  . Hypertensive disorder 08/28/2017  . Obesity 08/28/2017  . COPD exacerbation ([place]) 08/04/2017  . Acute and chronic respiratory failure with hypercapnia ([place])   . Acute on chronic diastolic heart failure ([place])   . Pulmonary HTN ([place])   . Genetic testing 02/27/2017  . Family history of thyroid cancer   . Hypomagnesemia 10/03/2016  . Boil, breast 10/03/2016  . Atypical chest pain   . HLD  (hyperlipidemia) 09/23/2016  . COPD (chronic obstructive pulmonary disease) ([place]) 09/23/2016  . Gout 09/23/2016  . DVT (deep vein thrombosis) in pregnancy 09/23/2016  . Thyroid cancer ([place]) 09/23/2016  . Hypocalcemia 09/23/2016  . AKI (acute kidney injury) ([place]) 09/23/2016  . 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 Acute pulmonary edema (HCC)   . Acute hypoxemic respiratory failure ([place])   . Acute on chronic respiratory failure with hypoxemia ([place])   . Ventilator dependent ([place])   . Respiratory failure ([place]) 07/16/2016  . Chronic combined systolic (congestive) and diastolic (congestive) heart failure ([place]) 02/10/2016  . Dyspnea   . CAP (community acquired pneumonia) 01/23/2016  . 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 Pulmonary hypertension ([place]) 01/23/2016  . Sepsis ([place]) 01/23/2016  . Obesity hypoventilation syndrome ([place]) 08/26/2015  . Chest pain 06/16/2015  . Dyslipidemia associated with type 2 diabetes mellitus ([place]) 04/24/2015  . Leukocytosis 04/24/2015  . Controlled type 2 diabetes mellitus without complication, without long-term current use of insulin ([place]) 04/24/2015  . Anxiety 04/24/2015  . Benign essential HTN 04/24/2015  . Normocytic anemia 04/24/2015  . Asthma with COPD with exacerbation ([place]) 04/24/2015  . OSA (obstructive sleep apnea) 05/10/2012  . Tobacco abuse 07/04/2011    Past Surgical History:  Procedure Laterality Date  . BUNIONECTOMY Bilateral   . CARDIAC CATHETERIZATION N/A 06/23/2015  Procedure: Right/Left Heart Cath and Coronary Angiography;  Surgeon: [name], MD;  Location: [place] [facility];  Service: Cardiovascular;  Laterality: N/A;  . COLONOSCOPY WITH PROPOFOL N/A 12/15/2015   Procedure: COLONOSCOPY WITH PROPOFOL;  Surgeon: [name], MD;  Location: [place];  Service: Endoscopy;  Laterality: N/A;  . THYROIDECTOMY  09/19/2016  . THYROIDECTOMY N/A 09/19/2016   Procedure: TOTAL  THYROIDECTOMY;  Surgeon: [name], MD;  Location: [place];  Service: General;  Laterality: N/A;  . TONSILLECTOMY    . TOTAL ABDOMINAL HYSTERECTOMY  07/14/10     OB History   No obstetric history on file.      Home Medications    Prior to Admission medications   Medication Sig Start Date End Date Taking? Authorizing Provider  albuterol (PROVENTIL HFA;VENTOLIN HFA) 108 (90 Base) MCG/ACT inhaler Inhale 1-2 puffs into the lungs every 6 (six) hours as needed for wheezing or shortness of breath. 04/23/18   Long, [name], MD  ALLERGY RELIEF 10 MG tablet Take 10 mg by mouth daily. 04/11/18   [provider]  allopurinol (ZYLOPRIM) 100 MG tablet Take 100 mg by mouth daily.    [provider]  Celedonio Miyamoto 62.5-25 MCG/INH AEPB USE ONE INHALATION ONCE DAILY 04/11/18   [provider]  azithromycin (ZITHROMAX) 250 MG tablet Take 1 tablet (250 mg total) by mouth daily. Take first 2 tablets together, then 1 every day until finished. 04/23/18   Long, [name], MD  Blood Glucose Monitoring Suppl (ACCU-CHEK AVIVA PLUS) w/Device KIT USE AS DIRECTED TO CHECK BLOOD SUGAR 08/07/17   [provider]  calcitRIOL (ROCALTROL) 0.5 MCG capsule Take 1 capsule (0.5 mcg total) by mouth daily. 09/26/16   Hongalgi, [name], MD  carvedilol (COREG) 6.25 MG tablet Take 1 tablet (6.25 mg total) by mouth 2 (two) times daily with a meal. 01/23/18   Bensimhon, [name], MD  cetirizine (ZYRTEC) 10 MG tablet Take 10 mg by mouth daily. 07/10/17   [provider]  clonazePAM (KLONOPIN) 2 MG tablet Take 2 mg by mouth 3 (three) times daily. for anxiety 05/08/17   [provider]  diclofenac sodium (VOLTAREN) 1 % GEL Apply 2 g topically 4 (four) times daily as needed (for pain).     [provider]  doxycycline (VIBRAMYCIN) 100 MG capsule Take 1 capsule (100 mg total) by mouth 2 (two) times daily. 01/12/18   Lawyer, Harrell Gave, PA-C  EPINEPHrine 0.3 mg/0.3 mL IJ SOAJ injection  Inject 0.3 mg into the muscle daily as needed (allergic reaction).    [provider]  FEROSUL 325 (65 Fe) MG tablet Take 325 mg by mouth daily. 03/22/17   [provider]  glucose blood test strip Use as instructed to check blood sugar up to 4 times daily 08/06/17   [name], DO  HYDROcodone-acetaminophen (NORCO/VICODIN) 5-325 MG tablet Take 1 tablet by mouth every 6 (six) hours as needed. 02/12/18   [provider]  ipratropium-albuterol (DUONEB) 0.5-2.5 (3) MG/3ML SOLN Take 3 mLs by nebulization 4 (four) times daily.  04/05/17   [provider]  levothyroxine (SYNTHROID, LEVOTHROID) 112 MCG tablet Take 224 mcg by mouth daily. 07/11/17   [provider]  lisinopril (PRINIVIL,ZESTRIL) 5 MG tablet Take 1 tablet (5 mg total) by mouth daily. Please call for office visit [phone] 08/15/17   [name], MD  loperamide (IMODIUM) 2 MG capsule Take 1 capsule (2 mg total) by mouth 4 (four) times daily as needed  for diarrhea or loose stools. 4/78/29   [name], MD  metFORMIN (GLUCOPHAGE) 500 MG tablet Take 1 tablet (500 mg total) by mouth 2 (two) times daily with a meal. 12/11/14   [name], MD  metoCLOPramide (REGLAN) 10 MG tablet Take 1 tablet (10 mg total) by mouth every 6 (six) hours as needed for nausea (or headache). 5/62/13   [name], MD  metoprolol succinate (TOPROL-XL) 25 MG 24 hr tablet Take 1 tablet (25 mg total) by mouth daily. 08/07/17   [name], DO  nystatin cream (MYCOSTATIN) Apply 1 application topically 2 (two) times daily as needed for dry skin.  04/13/17   [provider]  omeprazole (PRILOSEC) 40 MG capsule Take 40 mg by mouth daily. 04/13/15   [provider]  potassium chloride SA (K-DUR,KLOR-CON) 20 MEQ tablet Take 2 tablets (40 mEq total) by mouth daily. Patient taking differently: Take 20 mEq by mouth daily.  08/01/16   [name], MD  pravastatin (PRAVACHOL) 40 MG tablet Take 40 mg by  mouth every evening.  03/29/15   [provider]  QC STOOL SOFTENER PLS LAXATIVE 8.6-50 MG tablet Take 1 tablet by mouth at bedtime. 04/11/17   [provider]  spironolactone (ALDACTONE) 100 MG tablet Take 100 mg by mouth daily. 10/02/17   [provider]  tiotropium (SPIRIVA) 18 MCG inhalation capsule Place 18 mcg into inhaler and inhale daily.    [provider]    Family History Family History  Problem Relation Age of Onset  . Cancer Father        thought to be due to exposure to concrete  . Diabetes Mother   . Thyroid cancer Mother        dx in her 75s-60s  . Cancer Maternal Uncle        2 uncles with cancer NOS  . Brain cancer Paternal Aunt   . Cancer Cousin        maternal first cousin - NOS  . Cancer Cousin        maternal first cousin - NOS    Social History Social History   Tobacco Use  . Smoking status: Former Smoker    Packs/day: 0.50    Years: 41.00    Pack years: 20.50    Types: Cigarettes    Last attempt to quit: 07/2016    Years since quitting: 1.9  . Smokeless tobacco: Never Used  . Tobacco comment: Starting to Wean Off -- using Nicotine Patch  Substance Use Topics  . Alcohol use: Not Currently    Alcohol/week: 0.0 standard drinks    Comment: ? none now  . Drug use: Never    Comment: none for long time     Allergies   Bee venom; Fioricet [butalbital-apap-caffeine]; Ibuprofen; Lamisil [terbinafine]; and Nsaids   Review of Systems Review of Systems  All other systems reviewed and are negative.    Physical Exam Updated Vital Signs BP 114/65 (BP Location: Left Arm)   Pulse 85   Temp 98.1 F (36.7 C) (Oral)   Resp (!) 24   SpO2 (!) 88%   Physical Exam Vitals signs reviewed.  Constitutional:      Appearance: She is well-developed. She is obese. She is not ill-appearing.  HENT:     Head: Normocephalic.  Eyes:     Pupils: Pupils are equal, round, and reactive to light.  Neck:     Musculoskeletal: Normal  range of motion.  Cardiovascular:  Rate and Rhythm: Normal rate and regular rhythm.     Heart sounds: Normal heart sounds.  Pulmonary:     Effort: Pulmonary effort is normal.     Breath sounds: Normal breath sounds.  Abdominal:     General: Bowel sounds are normal. There is no abdominal bruit.     Palpations: Abdomen is soft.  Musculoskeletal: Normal range of motion.     Comments: Trace edema bilateral lower extremities  Skin:    Capillary Refill: Capillary refill takes less than 2 seconds.  Neurological:     General: No focal deficit present.     Mental Status: She is alert.  Psychiatric:        Mood and Affect: Mood normal.      ED Treatments / Results  Labs (all labs ordered are listed, but only abnormal results are displayed) Labs Reviewed  INFLUENZA PANEL BY PCR (TYPE A & B)  TROPONIN I  CBC WITH DIFFERENTIAL/PLATELET  BASIC METABOLIC PANEL  BRAIN NATRIURETIC PEPTIDE    EKG EKG Interpretation  Date/Time:  Sunday June 09 2018 20:58:38 EST Ventricular Rate:  85 PR Interval:    QRS Duration: 82 QT Interval:  382 QTC Calculation: 455 R Axis:   30 Text Interpretation:  Sinus rhythm Borderline T abnormalities, anterior leads Confirmed by Pattricia Boss [phone]) on 06/09/2018 10:34:51 PM   Radiology Dg Chest Port 1 View  Result Date: 06/09/2018 CLINICAL DATA:  Cough and chest pain. Shortness of breath. EXAM: PORTABLE CHEST 1 VIEW COMPARISON:  Radiographs 04/23/2018. additional prior radiographs reviewed. FINDINGS: Unchanged cardiomegaly mediastinal contours. Improved pulmonary edema from prior exam. Streaky bibasilar scarring again seen. No acute consolidative opacity. No large pleural effusion. No pneumothorax. Detailed evaluation limited by soft tissue attenuation from habitus and portable technique. Surgical clips at the thoracic inlet. IMPRESSION: Stable cardiomegaly and bibasilar scarring. Improved pulmonary edema from exam 6 weeks ago. Electronically Signed   By:  Keith Rake M.D.   On: 06/09/2018 22:08    Procedures Procedures (including critical care time)  Medications Ordered in ED Medications  fentaNYL (SUBLIMAZE) injection 50 mcg (has no administration in time range)     Initial Impression / Assessment and Plan / ED Course  I have reviewed the triage vital signs and the nursing notes.  Pertinent labs & imaging results that were available during my care of the patient were reviewed by me and considered in my medical decision making (see chart for details).      Oxygen sats 92% on home oxygen REviewed labs- patient with leukocytosis at baseline Discussed shingles shot- patient requesting and advised that we don't give in ED She feels she has some extra fluid peripherally with improvement of pulmonary edema on  cxr  Plan ct ango and may d/c if no pe IV ativan given as patient requesting her "nerve Medicine"- per records takes klonopin 36m tid\ 12:01 AM Vitals:   06/09/18 2103 06/09/18 2230  BP: 114/65 121/61  Pulse: 85 80  Resp: (!) 24 15  Temp: 98.1 F (36.7 C)   SpO2: (!) 88% 92%   Results with patient.  Plan Levaquin.  She feels that she is able to go home.  We have discussed return precautions and need for close follow-up and she voices understanding.  She has a follow-up appointment with her primary care doctor on Tuesday.  Final Clinical Impressions(s) / ED Diagnoses   Final diagnoses:  Dyspnea, unspecified type    ED Discharge Orders    None  Pattricia Boss, MD 06/10/18 [phone]

## 2018-06-10 MED ORDER — LEVOFLOXACIN 750 MG PO TABS
750.0000 mg | ORAL_TABLET | Freq: Once | ORAL | Status: AC
  Administered 2018-06-10: 750 mg via ORAL
  Filled 2018-06-10: qty 1

## 2018-06-10 MED ORDER — LEVOFLOXACIN 500 MG PO TABS: 500.0000 mg | ORAL_TABLET | Freq: Every day | ORAL | 0 refills | Status: DC

## 2018-06-10 NOTE — ED Notes (Signed)
Pt using bedside commode with no difficulty

## 2018-06-10 NOTE — ED Notes (Signed)
[facility] called for transport.  

## 2018-06-10 NOTE — ED Notes (Signed)
Patient verbalizes understanding of discharge instructions. Opportunity for questioning and answers were provided. Armband removed by staff, pt discharged from ED.  

## 2018-06-17 ENCOUNTER — Ambulatory Visit (INDEPENDENT_AMBULATORY_CARE_PROVIDER_SITE_OTHER)
Admission: EM | Admit: 2018-06-17 | Discharge: 2018-06-17 | Disposition: A | Discharge status: Other Acute Inpatient Hospital | Payer: Medicaid Other | Source: Home / Self Care

## 2018-06-17 ENCOUNTER — Ambulatory Visit (INDEPENDENT_AMBULATORY_CARE_PROVIDER_SITE_OTHER): Payer: Medicaid Other

## 2018-06-17 ENCOUNTER — Emergency Department (HOSPITAL_COMMUNITY): Payer: Medicaid Other

## 2018-06-17 ENCOUNTER — Other Ambulatory Visit: Payer: Self-pay

## 2018-06-17 ENCOUNTER — Observation Stay (HOSPITAL_COMMUNITY)
Admission: EM | Admit: 2018-06-17 | Discharge: 2018-06-19 | Disposition: A | Discharge status: Home / Self Care | Payer: Medicaid Other | Attending: Internal Medicine | Admitting: Internal Medicine

## 2018-06-17 ENCOUNTER — Encounter (HOSPITAL_COMMUNITY): Payer: Self-pay | Admitting: Emergency Medicine

## 2018-06-17 DIAGNOSIS — W010XXA Fall on same level from slipping, tripping and stumbling without subsequent striking against object, initial encounter: Secondary | ICD-10-CM | POA: Insufficient documentation

## 2018-06-17 DIAGNOSIS — M5124 Other intervertebral disc displacement, thoracic region: Secondary | ICD-10-CM | POA: Diagnosis not present

## 2018-06-17 DIAGNOSIS — F329 Major depressive disorder, single episode, unspecified: Secondary | ICD-10-CM | POA: Insufficient documentation

## 2018-06-17 DIAGNOSIS — R2689 Other abnormalities of gait and mobility: Secondary | ICD-10-CM | POA: Diagnosis not present

## 2018-06-17 DIAGNOSIS — Z79899 Other long term (current) drug therapy: Secondary | ICD-10-CM | POA: Insufficient documentation

## 2018-06-17 DIAGNOSIS — F419 Anxiety disorder, unspecified: Secondary | ICD-10-CM | POA: Insufficient documentation

## 2018-06-17 DIAGNOSIS — Z86718 Personal history of other venous thrombosis and embolism: Secondary | ICD-10-CM | POA: Diagnosis not present

## 2018-06-17 DIAGNOSIS — I110 Hypertensive heart disease with heart failure: Principal | ICD-10-CM | POA: Insufficient documentation

## 2018-06-17 DIAGNOSIS — Z888 Allergy status to other drugs, medicaments and biological substances status: Secondary | ICD-10-CM | POA: Insufficient documentation

## 2018-06-17 DIAGNOSIS — Z85850 Personal history of malignant neoplasm of thyroid: Secondary | ICD-10-CM | POA: Diagnosis not present

## 2018-06-17 DIAGNOSIS — K219 Gastro-esophageal reflux disease without esophagitis: Secondary | ICD-10-CM | POA: Insufficient documentation

## 2018-06-17 DIAGNOSIS — I5043 Acute on chronic combined systolic (congestive) and diastolic (congestive) heart failure: Secondary | ICD-10-CM | POA: Diagnosis not present

## 2018-06-17 DIAGNOSIS — S43005A Unspecified dislocation of left shoulder joint, initial encounter: Secondary | ICD-10-CM | POA: Diagnosis not present

## 2018-06-17 DIAGNOSIS — E662 Morbid (severe) obesity with alveolar hypoventilation: Secondary | ICD-10-CM | POA: Insufficient documentation

## 2018-06-17 DIAGNOSIS — Z791 Long term (current) use of non-steroidal anti-inflammatories (NSAID): Secondary | ICD-10-CM | POA: Insufficient documentation

## 2018-06-17 DIAGNOSIS — W0110XA Fall on same level from slipping, tripping and stumbling with subsequent striking against unspecified object, initial encounter: Secondary | ICD-10-CM

## 2018-06-17 DIAGNOSIS — Z9981 Dependence on supplemental oxygen: Secondary | ICD-10-CM | POA: Diagnosis not present

## 2018-06-17 DIAGNOSIS — Z6841 Body Mass Index (BMI) 40.0 and over, adult: Secondary | ICD-10-CM | POA: Diagnosis not present

## 2018-06-17 DIAGNOSIS — Z886 Allergy status to analgesic agent status: Secondary | ICD-10-CM | POA: Insufficient documentation

## 2018-06-17 DIAGNOSIS — I10 Essential (primary) hypertension: Secondary | ICD-10-CM | POA: Diagnosis present

## 2018-06-17 DIAGNOSIS — Z79890 Hormone replacement therapy (postmenopausal): Secondary | ICD-10-CM | POA: Insufficient documentation

## 2018-06-17 DIAGNOSIS — T148XXA Other injury of unspecified body region, initial encounter: Secondary | ICD-10-CM

## 2018-06-17 DIAGNOSIS — S39012A Strain of muscle, fascia and tendon of lower back, initial encounter: Secondary | ICD-10-CM | POA: Diagnosis present

## 2018-06-17 DIAGNOSIS — Y92009 Unspecified place in unspecified non-institutional (private) residence as the place of occurrence of the external cause: Secondary | ICD-10-CM | POA: Insufficient documentation

## 2018-06-17 DIAGNOSIS — S4992XA Unspecified injury of left shoulder and upper arm, initial encounter: Secondary | ICD-10-CM | POA: Diagnosis not present

## 2018-06-17 DIAGNOSIS — Z9181 History of falling: Secondary | ICD-10-CM | POA: Diagnosis not present

## 2018-06-17 DIAGNOSIS — Z7984 Long term (current) use of oral hypoglycemic drugs: Secondary | ICD-10-CM | POA: Insufficient documentation

## 2018-06-17 DIAGNOSIS — M109 Gout, unspecified: Secondary | ICD-10-CM | POA: Diagnosis not present

## 2018-06-17 DIAGNOSIS — I272 Pulmonary hypertension, unspecified: Secondary | ICD-10-CM | POA: Diagnosis not present

## 2018-06-17 DIAGNOSIS — I5033 Acute on chronic diastolic (congestive) heart failure: Secondary | ICD-10-CM | POA: Diagnosis present

## 2018-06-17 DIAGNOSIS — J449 Chronic obstructive pulmonary disease, unspecified: Secondary | ICD-10-CM | POA: Diagnosis present

## 2018-06-17 DIAGNOSIS — Z87891 Personal history of nicotine dependence: Secondary | ICD-10-CM | POA: Insufficient documentation

## 2018-06-17 DIAGNOSIS — E119 Type 2 diabetes mellitus without complications: Secondary | ICD-10-CM

## 2018-06-17 DIAGNOSIS — S29012A Strain of muscle and tendon of back wall of thorax, initial encounter: Secondary | ICD-10-CM | POA: Diagnosis not present

## 2018-06-17 DIAGNOSIS — S43402A Unspecified sprain of left shoulder joint, initial encounter: Secondary | ICD-10-CM | POA: Diagnosis present

## 2018-06-17 DIAGNOSIS — R2681 Unsteadiness on feet: Secondary | ICD-10-CM | POA: Insufficient documentation

## 2018-06-17 DIAGNOSIS — Z833 Family history of diabetes mellitus: Secondary | ICD-10-CM | POA: Insufficient documentation

## 2018-06-17 DIAGNOSIS — E1169 Type 2 diabetes mellitus with other specified complication: Secondary | ICD-10-CM

## 2018-06-17 DIAGNOSIS — Z883 Allergy status to other anti-infective agents status: Secondary | ICD-10-CM | POA: Diagnosis not present

## 2018-06-17 DIAGNOSIS — M6281 Muscle weakness (generalized): Secondary | ICD-10-CM | POA: Diagnosis not present

## 2018-06-17 DIAGNOSIS — W19XXXA Unspecified fall, initial encounter: Secondary | ICD-10-CM

## 2018-06-17 DIAGNOSIS — Z808 Family history of malignant neoplasm of other organs or systems: Secondary | ICD-10-CM | POA: Insufficient documentation

## 2018-06-17 DIAGNOSIS — R52 Pain, unspecified: Secondary | ICD-10-CM | POA: Diagnosis present

## 2018-06-17 LAB — BASIC METABOLIC PANEL
Anion gap: 9 (ref 5–15)
BUN: 7 mg/dL (ref 6–20)
CHLORIDE: 98 mmol/L (ref 98–111)
CO2: 29 mmol/L (ref 22–32)
CREATININE: 0.58 mg/dL (ref 0.44–1.00)
Calcium: 8.6 mg/dL — ABNORMAL LOW (ref 8.9–10.3)
GFR calc Af Amer: >60 mL/min (ref >60)
GFR calc non Af Amer: >60 mL/min (ref >60)
Glucose, Bld: 126 mg/dL — ABNORMAL HIGH (ref 70–99)
Potassium: 4 mmol/L (ref 3.5–5.1)
Sodium: 136 mmol/L (ref 135–145)

## 2018-06-17 LAB — CBC WITH DIFFERENTIAL/PLATELET
Abs Immature Granulocytes: 0.07 10*3/uL (ref 0.00–0.07)
Basophils Absolute: 0 10*3/uL (ref 0.0–0.1)
Basophils Relative: 0 %
Eosinophils Absolute: 0.2 10*3/uL (ref 0.0–0.5)
Eosinophils Relative: 1 %
HCT: 39.6 % (ref 36.0–46.0)
Hemoglobin: 12 g/dL (ref 12.0–15.0)
Immature Granulocytes: 1 %
Lymphocytes Relative: 17 %
Lymphs Abs: 2.4 10*3/uL (ref 0.7–4.0)
MCH: 28.5 pg (ref 26.0–34.0)
MCHC: 30.3 g/dL (ref 30.0–36.0)
MCV: 94.1 fL (ref 80.0–100.0)
MONO ABS: 0.9 10*3/uL (ref 0.1–1.0)
Monocytes Relative: 6 %
Neutro Abs: 10.3 10*3/uL — ABNORMAL HIGH (ref 1.7–7.7)
Neutrophils Relative %: 75 %
Platelets: 236 10*3/uL (ref 150–400)
RBC: 4.21 MIL/uL (ref 3.87–5.11)
RDW: 13.2 % (ref 11.5–15.5)
WBC: 13.8 10*3/uL — ABNORMAL HIGH (ref 4.0–10.5)
nRBC: 0 % (ref 0.0–0.2)

## 2018-06-17 LAB — CK: Total CK: 132 U/L (ref 38–234)

## 2018-06-17 MED ORDER — CLONAZEPAM 0.5 MG PO TABS
2.0000 mg | ORAL_TABLET | Freq: Once | ORAL | Status: AC
  Administered 2018-06-17: 2 mg via ORAL
  Filled 2018-06-17: qty 4

## 2018-06-17 MED ORDER — FENTANYL CITRATE (PF) 100 MCG/2ML IJ SOLN
50.0000 ug | Freq: Once | INTRAMUSCULAR | Status: AC
  Administered 2018-06-17: 50 ug via INTRAVENOUS
  Filled 2018-06-17: qty 2

## 2018-06-17 MED ORDER — HYDROMORPHONE HCL 1 MG/ML IJ SOLN
0.5000 mg | Freq: Once | INTRAMUSCULAR | Status: AC
  Administered 2018-06-17: 0.5 mg via INTRAVENOUS
  Filled 2018-06-17: qty 1

## 2018-06-17 NOTE — ED Notes (Signed)
Patient transported to CT scan . 

## 2018-06-17 NOTE — ED Triage Notes (Signed)
Pt. Stated, I fell over a long oxygen cord last night, my back is also hurting. I was at [facility] and they brought me down here.

## 2018-06-17 NOTE — ED Provider Notes (Signed)
Michele Owens    CSN: [id number] Arrival date & time: 06/17/18  1215     History   Chief Complaint Chief Complaint  Patient presents with  . Fall    HPI Michele Owens is a 58 y.o. female.   This is a 58 year old woman who fell and injured her left shoulder and skinned the lower thoracic spine.  She is an established [employer] urgent care patient.  She tripped on her oxygen tubing  Patient's problems include arthritis, COPD, hypertension, history of thyroid cancer, history of gout, type 2 diabetes, and GERD.     Past Medical History:  Diagnosis Date  . Anxiety   . Arthritis   . Asthma   . Chronic diastolic CHF (congestive heart failure) ([place])   . COPD (chronic obstructive pulmonary disease) (HCC)    Uses Oxygen at night  . Depression   . Diabetes mellitus without complication ([place])   . Dyspnea    occ  . Family history of thyroid cancer   . GERD (gastroesophageal reflux disease)   . Gout   . Headache    migraines  . History of DVT of lower extremity   . History of nuclear stress test    Myoview 2/17:  Low risk stress nuclear study with a small, moderate intensity, partially reversible inferior lateral defect consistent with small prior infarct and minimal peri-infarct ischemia; EF 68 with normal wall motion  . Hypertension   . Pneumonia   . Thyroid cancer ([place]) 09/23/2016    Patient Active Problem List   Diagnosis Date Noted  . Hypertensive disorder 08/28/2017  . Obesity 08/28/2017  . COPD exacerbation ([place]) 08/04/2017  . Acute and chronic respiratory failure with hypercapnia ([place])   . Acute on chronic diastolic heart failure ([place])   . Pulmonary HTN ([place])   . Genetic testing 02/27/2017  . Family history of thyroid cancer   . Hypomagnesemia 10/03/2016  . Boil, breast 10/03/2016  . Atypical chest pain   . HLD (hyperlipidemia) 09/23/2016  . COPD (chronic obstructive pulmonary disease) ([place]) 09/23/2016  . Gout 09/23/2016  . DVT (deep vein  thrombosis) in pregnancy 09/23/2016  . Thyroid cancer ([place]) 09/23/2016  . Hypocalcemia 09/23/2016  . AKI (acute kidney injury) ([place]) 09/23/2016  . 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 Acute pulmonary edema (HCC)   . Acute hypoxemic respiratory failure ([place])   . Acute on chronic respiratory failure with hypoxemia ([place])   . Ventilator dependent ([place])   . Respiratory failure ([place]) 07/16/2016  . Chronic combined systolic (congestive) and diastolic (congestive) heart failure ([place]) 02/10/2016  . Dyspnea   . CAP (community acquired pneumonia) 01/23/2016  . 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 Pulmonary hypertension ([place]) 01/23/2016  . Sepsis ([place]) 01/23/2016  . Obesity hypoventilation syndrome ([place]) 08/26/2015  . Chest pain 06/16/2015  . Dyslipidemia associated with type 2 diabetes mellitus ([place]) 04/24/2015  . Leukocytosis 04/24/2015  . Controlled type 2 diabetes mellitus without complication, without long-term current use of insulin ([place]) 04/24/2015  . Anxiety 04/24/2015  . Benign essential HTN 04/24/2015  . Normocytic anemia 04/24/2015  . Asthma with COPD with exacerbation ([place]) 04/24/2015  . OSA (obstructive sleep apnea) 05/10/2012  . Tobacco abuse 07/04/2011    Past Surgical History:  Procedure Laterality Date  . BUNIONECTOMY Bilateral   . CARDIAC CATHETERIZATION N/A 06/23/2015   Procedure: Right/Left Heart Cath and Coronary Angiography;  Surgeon: [name], MD;  Location: [facility];  Service: Cardiovascular;  Laterality: N/A;  . COLONOSCOPY WITH PROPOFOL N/A 12/15/2015   Procedure: COLONOSCOPY WITH PROPOFOL;  Surgeon: [name], MD;  Location: [facility];  Service: Endoscopy;  Laterality: N/A;  . THYROIDECTOMY  09/19/2016  . THYROIDECTOMY N/A 09/19/2016   Procedure: TOTAL THYROIDECTOMY;  Surgeon: [name], MD;  Location: [facility];  Service: General;  Laterality: N/A;  . TONSILLECTOMY    . TOTAL ABDOMINAL  HYSTERECTOMY  07/14/10    OB History   No obstetric history on file.      Home Medications    Prior to Admission medications   Medication Sig Start Date End Date Taking? Authorizing Provider  albuterol (PROVENTIL HFA;VENTOLIN HFA) 108 (90 Base) MCG/ACT inhaler Inhale 1-2 puffs into the lungs every 6 (six) hours as needed for wheezing or shortness of breath. 04/23/18   Long, [name], MD  ALLERGY RELIEF 10 MG tablet Take 10 mg by mouth daily. 04/11/18   [provider]  allopurinol (ZYLOPRIM) 100 MG tablet Take 100 mg by mouth daily.    [provider]  Celedonio Miyamoto 62.5-25 MCG/INH AEPB USE ONE INHALATION ONCE DAILY 04/11/18   [provider]  azithromycin (ZITHROMAX) 250 MG tablet Take 1 tablet (250 mg total) by mouth daily. Take first 2 tablets together, then 1 every day until finished. 04/23/18   Long, [name], MD  Blood Glucose Monitoring Suppl (ACCU-CHEK AVIVA PLUS) w/Device KIT USE AS DIRECTED TO CHECK BLOOD SUGAR 08/07/17   [provider]  calcitRIOL (ROCALTROL) 0.5 MCG capsule Take 1 capsule (0.5 mcg total) by mouth daily. 09/26/16   Hongalgi, [name], MD  carvedilol (COREG) 6.25 MG tablet Take 1 tablet (6.25 mg total) by mouth 2 (two) times daily with a meal. 01/23/18   Bensimhon, [name], MD  cetirizine (ZYRTEC) 10 MG tablet Take 10 mg by mouth daily. 07/10/17   [provider]  clonazePAM (KLONOPIN) 2 MG tablet Take 2 mg by mouth 3 (three) times daily. for anxiety 05/08/17   [provider]  diclofenac sodium (VOLTAREN) 1 % GEL Apply 2 g topically 4 (four) times daily as needed (for pain).     [provider]  doxycycline (VIBRAMYCIN) 100 MG capsule Take 1 capsule (100 mg total) by mouth 2 (two) times daily. 01/12/18   Lawyer, Harrell Gave, PA-C  EPINEPHrine 0.3 mg/0.3 mL IJ SOAJ injection Inject 0.3 mg into the muscle daily as needed (allergic reaction).    [provider]  FEROSUL 325 (65 Fe) MG tablet Take 325 mg by  mouth daily. 03/22/17   [provider]  glucose blood test strip Use as instructed to check blood sugar up to 4 times daily 08/06/17   [name], DO  HYDROcodone-acetaminophen (NORCO/VICODIN) 5-325 MG tablet Take 1 tablet by mouth every 6 (six) hours as needed. 02/12/18   [provider]  ipratropium-albuterol (DUONEB) 0.5-2.5 (3) MG/3ML SOLN Take 3 mLs by nebulization 4 (four) times daily.  04/05/17   [provider]  levofloxacin (LEVAQUIN) 500 MG tablet Take 1 tablet (500 mg total) by mouth daily. 06/10/18   Pattricia Boss, MD  levothyroxine (SYNTHROID, LEVOTHROID) 112 MCG tablet Take 224 mcg by mouth daily. 07/11/17   [provider]  lisinopril (PRINIVIL,ZESTRIL) 5 MG tablet Take 1 tablet (5 mg total) by mouth daily. Please call for office visit [phone] 08/15/17   [name], MD  loperamide (IMODIUM) 2 MG capsule Take 1 capsule (2 mg total) by mouth 4 (four) times daily  as needed for diarrhea or loose stools. 11/16/79   [name], MD  metFORMIN (GLUCOPHAGE) 500 MG tablet Take 1 tablet (500 mg total) by mouth 2 (two) times daily with a meal. 12/11/14   [name], MD  metoCLOPramide (REGLAN) 10 MG tablet Take 1 tablet (10 mg total) by mouth every 6 (six) hours as needed for nausea (or headache). 04/12/13   [name], MD  metoprolol succinate (TOPROL-XL) 25 MG 24 hr tablet Take 1 tablet (25 mg total) by mouth daily. 08/07/17   [name], DO  nystatin cream (MYCOSTATIN) Apply 1 application topically 2 (two) times daily as needed for dry skin.  04/13/17   [provider]  omeprazole (PRILOSEC) 40 MG capsule Take 40 mg by mouth daily. 04/13/15   [provider]  potassium chloride SA (K-DUR,KLOR-CON) 20 MEQ tablet Take 2 tablets (40 mEq total) by mouth daily. Patient taking differently: Take 20 mEq by mouth daily.  08/01/16   [name], MD  pravastatin (PRAVACHOL) 40 MG tablet Take 40 mg by mouth every evening.  03/29/15    [provider]  QC STOOL SOFTENER PLS LAXATIVE 8.6-50 MG tablet Take 1 tablet by mouth at bedtime. 04/11/17   [provider]  spironolactone (ALDACTONE) 100 MG tablet Take 100 mg by mouth daily. 10/02/17   [provider]  tiotropium (SPIRIVA) 18 MCG inhalation capsule Place 18 mcg into inhaler and inhale daily.    [provider]    Family History Family History  Problem Relation Age of Onset  . Cancer Father        thought to be due to exposure to concrete  . Diabetes Mother   . Thyroid cancer Mother        dx in her 71s-60s  . Cancer Maternal Uncle        2 uncles with cancer NOS  . Brain cancer Paternal Aunt   . Cancer Cousin        maternal first cousin - NOS  . Cancer Cousin        maternal first cousin - NOS    Social History Social History   Tobacco Use  . Smoking status: Former Smoker    Packs/day: 0.50    Years: 41.00    Pack years: 20.50    Types: Cigarettes    Last attempt to quit: 07/2016    Years since quitting: 1.9  . Smokeless tobacco: Never Used  . Tobacco comment: Starting to Wean Off -- using Nicotine Patch  Substance Use Topics  . Alcohol use: Not Currently    Alcohol/week: 0.0 standard drinks    Comment: ? none now  . Drug use: Never    Comment: none for long time     Allergies   Bee venom; Fioricet [butalbital-apap-caffeine]; Ibuprofen; Lamisil [terbinafine]; and Nsaids   Review of Systems Review of Systems   Physical Exam Triage Vital Signs ED Triage Vitals [06/17/18 1245]  Enc Vitals Group     BP 140/62     Pulse Rate 91     Resp 18     Temp 98.2 F (36.8 C)     Temp Source Temporal     SpO2 94 %     Weight      Height      Head Circumference      Peak Flow      Pain Score 9     Pain Loc      Pain Edu?  Excl. in [place]?    No data found.  Updated Vital Signs BP 140/62 (BP Location: Left Arm)   Pulse 91   Temp 98.2 F (36.8 C) (Temporal)   Resp 18   SpO2 94%    Physical  Exam Vitals signs and nursing note reviewed.  Constitutional:      Appearance: She is obese.  HENT:     Head: Atraumatic.     Nose: Nose normal.  Neck:     Musculoskeletal: Normal range of motion and neck supple.  Cardiovascular:     Rate and Rhythm: Normal rate and regular rhythm.  Pulmonary:     Effort: Pulmonary effort is normal.     Breath sounds: Normal breath sounds.  Musculoskeletal:        General: Tenderness and signs of injury present. No deformity.     Comments: Tender anterior and posterior joint line of left shoulder  Skin:    Findings: Rash present.  Neurological:     General: No focal deficit present.     Mental Status: She is alert.        UC Treatments / Results  Labs (all labs ordered are listed, but only abnormal results are displayed) Labs Reviewed - No data to display  EKG None  Radiology No results found.  Procedures Procedures (including critical care time)  Medications Ordered in UC Medications - No data to display  Initial Impression / Assessment and Plan / UC Course  I have reviewed the triage vital signs and the nursing notes.  Pertinent labs & imaging results that were available during my care of the patient were reviewed by me and considered in my medical decision making (see chart for details).    Final Clinical Impressions(s) / UC Diagnoses   Final diagnoses:  Shoulder dislocation, left, initial encounter     Discharge Instructions     You need to be evaluated in the emergency department for a dislocated shoulder.    ED Prescriptions    None     Controlled Substance Prescriptions [place] Controlled Substance Registry consulted? Not Applicable   Robyn Haber, MD 06/17/18 1424

## 2018-06-17 NOTE — ED Triage Notes (Signed)
Pt sts trip and fall last night c/o left shoulder pain and back pain; pt wears home O2

## 2018-06-17 NOTE — ED Notes (Signed)
Pt called multiple times for triage, no answer.

## 2018-06-17 NOTE — Discharge Instructions (Addendum)
You need to be evaluated in the emergency department for a dislocated shoulder.

## 2018-06-17 NOTE — ED Provider Notes (Addendum)
[facility] EMERGENCY DEPARTMENT Provider Note   CSN: [id number] Arrival date & time: 06/17/18  1440    History   Chief Complaint Chief Complaint  Patient presents with  . Shoulder Injury    HPI Michele Owens is a 58 y.o. female.     HPI Patient presents with a fall.  States she fell last night when she tripped over her oxygen cord and she could not get back up.  States she had to crawl into another room.  States that her pain is severe and uncontrolled with her medicines at home.  States she was told to come down here because her shoulder is dislocated.  States it is very swollen.  Also states she hurts in her back.  States she cannot possibly manage this anywhere else.  Also states she needs her fluid taken off her legs.  And says she needs her calm down pill.  No localizing weakness.  No abdominal pain.  No real shortness of breath but does have chronic oxygen at home. Past Medical History:  Diagnosis Date  . Anxiety   . Arthritis   . Asthma   . Chronic diastolic CHF (congestive heart failure) ([place])   . COPD (chronic obstructive pulmonary disease) (HCC)    Uses Oxygen at night  . Depression   . Diabetes mellitus without complication ([place])   . Dyspnea    occ  . Family history of thyroid cancer   . GERD (gastroesophageal reflux disease)   . Gout   . Headache    migraines  . History of DVT of lower extremity   . History of nuclear stress test    Myoview 2/17:  Low risk stress nuclear study with a small, moderate intensity, partially reversible inferior lateral defect consistent with small prior infarct and minimal peri-infarct ischemia; EF 68 with normal wall motion  . Hypertension   . Pneumonia   . Thyroid cancer ([place]) 09/23/2016    Patient Active Problem List   Diagnosis Date Noted  . Hypertensive disorder 08/28/2017  . Obesity 08/28/2017  . COPD exacerbation ([place]) 08/04/2017  . Acute and chronic respiratory failure with hypercapnia ([place])   .  Acute on chronic diastolic heart failure ([place])   . Pulmonary HTN ([place])   . Genetic testing 02/27/2017  . Family history of thyroid cancer   . Hypomagnesemia 10/03/2016  . Boil, breast 10/03/2016  . Atypical chest pain   . HLD (hyperlipidemia) 09/23/2016  . COPD (chronic obstructive pulmonary disease) ([place]) 09/23/2016  . Gout 09/23/2016  . DVT (deep vein thrombosis) in pregnancy 09/23/2016  . Thyroid cancer ([name]) 09/23/2016  . Hypocalcemia 09/23/2016  . AKI (acute kidney injury) ([place]) 09/23/2016  . 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 Acute pulmonary edema (HCC)   . Acute hypoxemic respiratory failure ([place])   . Acute on chronic respiratory failure with hypoxemia ([place])   . Ventilator dependent ([place])   . Respiratory failure ([place]) 07/16/2016  . Chronic combined systolic (congestive) and diastolic (congestive) heart failure ([place]) 02/10/2016  . Dyspnea   . CAP (community acquired pneumonia) 01/23/2016  . 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 Pulmonary hypertension ([place]) 01/23/2016  . Sepsis ([place]) 01/23/2016  . Obesity hypoventilation syndrome ([place]) 08/26/2015  . Chest pain 06/16/2015  . Dyslipidemia associated with type 2 diabetes mellitus ([place]) 04/24/2015  . Leukocytosis 04/24/2015  . Controlled type 2 diabetes mellitus without complication, without long-term current use of insulin ([place]) 04/24/2015  .  Anxiety 04/24/2015  . Benign essential HTN 04/24/2015  . Normocytic anemia 04/24/2015  . Asthma with COPD with exacerbation ([place]) 04/24/2015  . OSA (obstructive sleep apnea) 05/10/2012  . Tobacco abuse 07/04/2011    Past Surgical History:  Procedure Laterality Date  . BUNIONECTOMY Bilateral   . CARDIAC CATHETERIZATION N/A 06/23/2015   Procedure: Right/Left Heart Cath and Coronary Angiography;  Surgeon: [name], MD;  Location: [facility];  Service: Cardiovascular;  Laterality: N/A;  . COLONOSCOPY WITH PROPOFOL N/A  12/15/2015   Procedure: COLONOSCOPY WITH PROPOFOL;  Surgeon: [name], MD;  Location: [facility];  Service: Endoscopy;  Laterality: N/A;  . THYROIDECTOMY  09/19/2016  . THYROIDECTOMY N/A 09/19/2016   Procedure: TOTAL THYROIDECTOMY;  Surgeon: [name], MD;  Location: [facility];  Service: General;  Laterality: N/A;  . TONSILLECTOMY    . TOTAL ABDOMINAL HYSTERECTOMY  07/14/10     OB History   No obstetric history on file.      Home Medications    Prior to Admission medications   Medication Sig Start Date End Date Taking? Authorizing Provider  albuterol (PROVENTIL HFA;VENTOLIN HFA) 108 (90 Base) MCG/ACT inhaler Inhale 1-2 puffs into the lungs every 6 (six) hours as needed for wheezing or shortness of breath. 04/23/18   Long, [name], MD  ALLERGY RELIEF 10 MG tablet Take 10 mg by mouth daily. 04/11/18   [provider]  allopurinol (ZYLOPRIM) 100 MG tablet Take 100 mg by mouth daily.    [provider]  Celedonio Miyamoto 62.5-25 MCG/INH AEPB USE ONE INHALATION ONCE DAILY 04/11/18   [provider]  azithromycin (ZITHROMAX) 250 MG tablet Take 1 tablet (250 mg total) by mouth daily. Take first 2 tablets together, then 1 every day until finished. 04/23/18   Long, [name], MD  Blood Glucose Monitoring Suppl (ACCU-CHEK AVIVA PLUS) w/Device KIT USE AS DIRECTED TO CHECK BLOOD SUGAR 08/07/17   [provider]  calcitRIOL (ROCALTROL) 0.5 MCG capsule Take 1 capsule (0.5 mcg total) by mouth daily. 09/26/16   Hongalgi, [name], MD  carvedilol (COREG) 6.25 MG tablet Take 1 tablet (6.25 mg total) by mouth 2 (two) times daily with a meal. 01/23/18   Bensimhon, [name], MD  cetirizine (ZYRTEC) 10 MG tablet Take 10 mg by mouth daily. 07/10/17   [provider]  clonazePAM (KLONOPIN) 2 MG tablet Take 2 mg by mouth 3 (three) times daily. for anxiety 05/08/17   [provider]  diclofenac sodium (VOLTAREN) 1 % GEL Apply 2 g topically 4 (four) times daily as needed (for  pain).     [provider]  doxycycline (VIBRAMYCIN) 100 MG capsule Take 1 capsule (100 mg total) by mouth 2 (two) times daily. 01/12/18   Lawyer, Harrell Gave, PA-C  EPINEPHrine 0.3 mg/0.3 mL IJ SOAJ injection Inject 0.3 mg into the muscle daily as needed (allergic reaction).    [provider]  FEROSUL 325 (65 Fe) MG tablet Take 325 mg by mouth daily. 03/22/17   [provider]  glucose blood test strip Use as instructed to check blood sugar up to 4 times daily 08/06/17   [name], DO  HYDROcodone-acetaminophen (NORCO/VICODIN) 5-325 MG tablet Take 1 tablet by mouth every 6 (six) hours as needed. 02/12/18   [provider]  ipratropium-albuterol (DUONEB) 0.5-2.5 (3) MG/3ML SOLN Take 3 mLs by nebulization 4 (four) times daily.  04/05/17   [provider]  levofloxacin (LEVAQUIN) 500 MG tablet Take 1 tablet (500  mg total) by mouth daily. 06/10/18   Pattricia Boss, MD  levothyroxine (SYNTHROID, LEVOTHROID) 112 MCG tablet Take 224 mcg by mouth daily. 07/11/17   [provider]  lisinopril (PRINIVIL,ZESTRIL) 5 MG tablet Take 1 tablet (5 mg total) by mouth daily. Please call for office visit [phone] 08/15/17   [name], MD  loperamide (IMODIUM) 2 MG capsule Take 1 capsule (2 mg total) by mouth 4 (four) times daily as needed for diarrhea or loose stools. 07/18/71   [name], MD  metFORMIN (GLUCOPHAGE) 500 MG tablet Take 1 tablet (500 mg total) by mouth 2 (two) times daily with a meal. 12/11/14   [name], MD  metoCLOPramide (REGLAN) 10 MG tablet Take 1 tablet (10 mg total) by mouth every 6 (six) hours as needed for nausea (or headache). 5/32/99   [name], MD  metoprolol succinate (TOPROL-XL) 25 MG 24 hr tablet Take 1 tablet (25 mg total) by mouth daily. 08/07/17   [name], DO  nystatin cream (MYCOSTATIN) Apply 1 application topically 2 (two) times daily as needed for dry skin.  04/13/17   [provider]  omeprazole  (PRILOSEC) 40 MG capsule Take 40 mg by mouth daily. 04/13/15   [provider]  potassium chloride SA (K-DUR,KLOR-CON) 20 MEQ tablet Take 2 tablets (40 mEq total) by mouth daily. Patient taking differently: Take 20 mEq by mouth daily.  08/01/16   [name], MD  pravastatin (PRAVACHOL) 40 MG tablet Take 40 mg by mouth every evening.  03/29/15   [provider]  QC STOOL SOFTENER PLS LAXATIVE 8.6-50 MG tablet Take 1 tablet by mouth at bedtime. 04/11/17   [provider]  spironolactone (ALDACTONE) 100 MG tablet Take 100 mg by mouth daily. 10/02/17   [provider]  tiotropium (SPIRIVA) 18 MCG inhalation capsule Place 18 mcg into inhaler and inhale daily.    [provider]    Family History Family History  Problem Relation Age of Onset  . Cancer Father        thought to be due to exposure to concrete  . Diabetes Mother   . Thyroid cancer Mother        dx in her 29s-60s  . Cancer Maternal Uncle        2 uncles with cancer NOS  . Brain cancer Paternal Aunt   . Cancer Cousin        maternal first cousin - NOS  . Cancer Cousin        maternal first cousin - NOS    Social History Social History   Tobacco Use  . Smoking status: Former Smoker    Packs/day: 0.50    Years: 41.00    Pack years: 20.50    Types: Cigarettes    Last attempt to quit: 07/2016    Years since quitting: 1.9  . Smokeless tobacco: Never Used  . Tobacco comment: Starting to Wean Off -- using Nicotine Patch  Substance Use Topics  . Alcohol use: Not Currently    Alcohol/week: 0.0 standard drinks    Comment: ? none now  . Drug use: Never    Comment: none for long time     Allergies   Bee venom; Fioricet [butalbital-apap-caffeine]; Ibuprofen; Lamisil [terbinafine]; and Nsaids   Review of Systems Review of Systems  Constitutional: Negative for appetite change.  HENT: Negative for congestion.   Respiratory: Negative for shortness of breath.   Cardiovascular:  Positive for leg swelling. Negative for chest  pain.  Gastrointestinal: Negative for abdominal pain.  Musculoskeletal: Positive for back pain. Negative for neck pain.       Left shoulder pain.  Back pain.  Skin: Negative for rash and wound.  Neurological: Positive for weakness.  Psychiatric/Behavioral: Negative for confusion.     Physical Exam Updated Vital Signs BP 104/62   Pulse 85   Temp 98.5 F (36.9 C) (Oral)   Resp 16   SpO2 97%   Physical Exam Vitals signs and nursing note reviewed.  Constitutional:      Comments: Patient is morbidly obese and on chronic oxygen.  HENT:     Head: Normocephalic.     Nose: Nose normal.     Mouth/Throat:     Mouth: Mucous membranes are moist.  Eyes:     Extraocular Movements: Extraocular movements intact.  Neck:     Musculoskeletal: Neck supple.     Comments: No midline tenderness. Cardiovascular:     Pulses: Normal pulses.  Pulmonary:     Breath sounds: No wheezing or rhonchi.  Abdominal:     Tenderness: There is no abdominal tenderness.  Musculoskeletal:     Comments: Some edema bilateral lower extremities.  Decreased movement in left shoulder where patient will hardly let me move the arm at all however when I am not examining it she does move it more freely including using it to boost herself up.  Tenderness over lower thoracic and upper lumbar spine.  Some tenderness over pelvis bilaterally.  No tenderness over lower extremities.  Skin:    General: Skin is warm.  Neurological:     General: No focal deficit present.      ED Treatments / Results  Labs (all labs ordered are listed, but only abnormal results are displayed) Labs Reviewed  BASIC METABOLIC PANEL - Abnormal; Notable for the following components:      Result Value   Glucose, Bld 126 (*)    Calcium 8.6 (*)    All other components within normal limits  CBC WITH DIFFERENTIAL/PLATELET - Abnormal; Notable for the following components:   WBC 13.8 (*)    Neutro Abs  10.3 (*)    All other components within normal limits  CK    EKG None  Radiology Dg Thoracic Spine 2 View  Result Date: 06/17/2018 CLINICAL DATA:  58 y/o F; fall today with upper and lower back pain. EXAM: THORACIC SPINE 2 VIEWS; LUMBAR SPINE - COMPLETE 4+ VIEW COMPARISON:  None. FINDINGS: Thoracic spine: Twelve paired thoracic ribs. Surgical clips project over the lower neck. Multilevel bridging endplate osteophytes. Lumbar spine: Five lumbar vertebral bodies. No pars defect. Normal lumbar lordosis without listhesis. No loss of vertebral body or disc space height. Small multilevel endplate marginal osteophytes. IMPRESSION: 1. No acute fracture or malalignment. 2. Multilevel bridging endplate osteophytes of thoracic spine and mild discogenic degenerative changes of lumbar spine. Electronically Signed   By: Kristine Garbe M.D.   On: 06/17/2018 22:38   Dg Lumbar Spine Complete  Result Date: 06/17/2018 CLINICAL DATA:  58 y/o F; fall today with upper and lower back pain. EXAM: THORACIC SPINE 2 VIEWS; LUMBAR SPINE - COMPLETE 4+ VIEW COMPARISON:  None. FINDINGS: Thoracic spine: Twelve paired thoracic ribs. Surgical clips project over the lower neck. Multilevel bridging endplate osteophytes. Lumbar spine: Five lumbar vertebral bodies. No pars defect. Normal lumbar lordosis without listhesis. No loss of vertebral body or disc space height. Small multilevel endplate marginal osteophytes. IMPRESSION: 1. No acute fracture or malalignment. 2. Multilevel  bridging endplate osteophytes of thoracic spine and mild discogenic degenerative changes of lumbar spine. Electronically Signed   By: Kristine Garbe M.D.   On: 06/17/2018 22:38   Dg Pelvis 1-2 Views  Result Date: 06/17/2018 CLINICAL DATA:  Pelvic pain after fall EXAM: PELVIS - 1-2 VIEW COMPARISON:  None. FINDINGS: No fracture or dislocation of the left hip. There is lucency of the medial aspect of the left inferior pubic ramus. No other  osseous abnormality. IMPRESSION: 1. Lucency of the medial aspect of the left inferior pubic ramus is favored to be a composite shadow artifact, though a minimally displaced fracture could also have this appearance. CT of the pelvis would be confirmatory. 2. No fracture or dislocation of the hips. Electronically Signed   By: Ulyses Jarred M.D.   On: 06/17/2018 22:42   Ct Shoulder Left Wo Contrast  Result Date: 06/17/2018 CLINICAL DATA:  58 year old female status post fall onto left shoulder last night. Pain. EXAM: CT OF THE UPPER LEFT EXTREMITY WITHOUT CONTRAST TECHNIQUE: Multidetector CT imaging of the upper left extremity was performed according to the standard protocol. COMPARISON:  Left shoulder radiographs today.  CTA chest 06/09/2018. FINDINGS: No glenohumeral dislocation. Intact proximal left humerus and visible humerus shaft. Intact left scapula. Intact left clavicle. Normal acromioclavicular joint alignment. Degenerative changes at the left [facility] joint with small subchondral cysts and degenerative spurring. Soft tissue surrounding the left shoulder are within normal limits aside from a benign appearing intramuscular lipoma of the left pectoralis on series 3, image 27. No left axillary lymphadenopathy. Visible left ribs and spine appear stable. Sequelae of thyroidectomy. Stable visible mediastinum. Stable visible left lung parenchyma. IMPRESSION: No acute traumatic injury identified about the left shoulder. Electronically Signed   By: Genevie Ann M.D.   On: 06/17/2018 22:41   Dg Shoulder Left  Result Date: 06/17/2018 CLINICAL DATA:  Pain after fall last night EXAM: LEFT SHOULDER - 2+ VIEW COMPARISON:  None. FINDINGS: Mild AC joint degenerative changes.  No fracture or dislocation. IMPRESSION: Negative. Electronically Signed   By: Dorise Bullion III M.D   On: 06/17/2018 14:31    Procedures Procedures (including critical care time)  Medications Ordered in ED Medications  clonazePAM (KLONOPIN) tablet 2  mg (has no administration in time range)  fentaNYL (SUBLIMAZE) injection 50 mcg (50 mcg Intravenous Given 06/17/18 2259)     Initial Impression / Assessment and Plan / ED Course  I have reviewed the triage vital signs and the nursing notes.  Pertinent labs & imaging results that were available during my care of the patient were reviewed by me and considered in my medical decision making (see chart for details).        Patient with fall.  X-ray done at urgent care did not show dislocation, however reviewing notes she was sent over for her shoulder dislocation.  Somewhat difficult exam because patient would not let me move the shoulder at all.  However it appeared to be located.  CT scan done and did not show any acute injury.  Lumbar and thoracic spine done showed no fracture with some chronic disease.  Pelvic x-ray showed possible fracture versus artifact and CT scan be done.  However patient is on chronic pain meds at home and cannot ambulate.  Also with using a cane at baseline she now also cannot use her left arm.  I think patient benefit from admission to the hospital for further pain management and work-up.  Patient also states she needs her calm  down pill and fluid taken off her. Patient will not ambulate due to the pain.  Final Clinical Impressions(s) / ED Diagnoses   Final diagnoses:  Fall, initial encounter  Strain of lumbar region, initial encounter  Sprain of left shoulder, unspecified shoulder sprain type, initial encounter  Strain of thoracic back region    ED Discharge Orders    None       Davonna Belling, MD 06/17/18 2325    Davonna Belling, MD 06/17/18 2337

## 2018-06-17 NOTE — ED Notes (Signed)
Patient currently at radiology.

## 2018-06-18 ENCOUNTER — Observation Stay (HOSPITAL_BASED_OUTPATIENT_CLINIC_OR_DEPARTMENT_OTHER): Payer: Medicaid Other

## 2018-06-18 ENCOUNTER — Observation Stay (HOSPITAL_COMMUNITY): Payer: Medicaid Other

## 2018-06-18 ENCOUNTER — Encounter (HOSPITAL_COMMUNITY): Payer: Self-pay | Admitting: Internal Medicine

## 2018-06-18 DIAGNOSIS — W19XXXA Unspecified fall, initial encounter: Secondary | ICD-10-CM

## 2018-06-18 DIAGNOSIS — R609 Edema, unspecified: Secondary | ICD-10-CM

## 2018-06-18 DIAGNOSIS — S43402A Unspecified sprain of left shoulder joint, initial encounter: Secondary | ICD-10-CM | POA: Diagnosis present

## 2018-06-18 DIAGNOSIS — E119 Type 2 diabetes mellitus without complications: Secondary | ICD-10-CM | POA: Diagnosis not present

## 2018-06-18 DIAGNOSIS — M109 Gout, unspecified: Secondary | ICD-10-CM

## 2018-06-18 DIAGNOSIS — S39012A Strain of muscle, fascia and tendon of lower back, initial encounter: Secondary | ICD-10-CM | POA: Diagnosis not present

## 2018-06-18 DIAGNOSIS — I5033 Acute on chronic diastolic (congestive) heart failure: Secondary | ICD-10-CM | POA: Diagnosis not present

## 2018-06-18 DIAGNOSIS — I110 Hypertensive heart disease with heart failure: Secondary | ICD-10-CM | POA: Diagnosis not present

## 2018-06-18 DIAGNOSIS — R52 Pain, unspecified: Secondary | ICD-10-CM

## 2018-06-18 DIAGNOSIS — S29012A Strain of muscle and tendon of back wall of thorax, initial encounter: Secondary | ICD-10-CM | POA: Diagnosis present

## 2018-06-18 DIAGNOSIS — I272 Pulmonary hypertension, unspecified: Secondary | ICD-10-CM

## 2018-06-18 DIAGNOSIS — I5043 Acute on chronic combined systolic (congestive) and diastolic (congestive) heart failure: Secondary | ICD-10-CM | POA: Diagnosis not present

## 2018-06-18 DIAGNOSIS — I509 Heart failure, unspecified: Secondary | ICD-10-CM

## 2018-06-18 LAB — CBC WITH DIFFERENTIAL/PLATELET
Abs Immature Granulocytes: 0.05 10*3/uL (ref 0.00–0.07)
Basophils Absolute: 0 10*3/uL (ref 0.0–0.1)
Basophils Relative: 0 %
EOS ABS: 0.2 10*3/uL (ref 0.0–0.5)
Eosinophils Relative: 2 %
HCT: 38.5 % (ref 36.0–46.0)
Hemoglobin: 11.5 g/dL — ABNORMAL LOW (ref 12.0–15.0)
Immature Granulocytes: 0 %
Lymphocytes Relative: 19 %
Lymphs Abs: 2.3 10*3/uL (ref 0.7–4.0)
MCH: 28.1 pg (ref 26.0–34.0)
MCHC: 29.9 g/dL — AB (ref 30.0–36.0)
MCV: 94.1 fL (ref 80.0–100.0)
Monocytes Absolute: 0.8 10*3/uL (ref 0.1–1.0)
Monocytes Relative: 6 %
NRBC: 0 % (ref 0.0–0.2)
Neutro Abs: 9 10*3/uL — ABNORMAL HIGH (ref 1.7–7.7)
Neutrophils Relative %: 73 %
Platelets: 238 10*3/uL (ref 150–400)
RBC: 4.09 MIL/uL (ref 3.87–5.11)
RDW: 13.1 % (ref 11.5–15.5)
WBC: 12.4 10*3/uL — ABNORMAL HIGH (ref 4.0–10.5)

## 2018-06-18 LAB — COMPREHENSIVE METABOLIC PANEL
ALK PHOS: 59 U/L (ref 38–126)
ALT: 17 U/L (ref 0–44)
ANION GAP: 8 (ref 5–15)
AST: 17 U/L (ref 15–41)
Albumin: 3.5 g/dL (ref 3.5–5.0)
BUN: 5 mg/dL — ABNORMAL LOW (ref 6–20)
CO2: 31 mmol/L (ref 22–32)
Calcium: 8.5 mg/dL — ABNORMAL LOW (ref 8.9–10.3)
Chloride: 99 mmol/L (ref 98–111)
Creatinine, Ser: 0.52 mg/dL (ref 0.44–1.00)
GFR calc Af Amer: >60 mL/min (ref >60)
GFR calc non Af Amer: >60 mL/min (ref >60)
Glucose, Bld: 111 mg/dL — ABNORMAL HIGH (ref 70–99)
Potassium: 3.9 mmol/L (ref 3.5–5.1)
Sodium: 138 mmol/L (ref 135–145)
Total Bilirubin: 0.5 mg/dL (ref 0.3–1.2)
Total Protein: 6.5 g/dL (ref 6.5–8.1)

## 2018-06-18 LAB — MAGNESIUM: Magnesium: 1.7 mg/dL (ref 1.7–2.4)

## 2018-06-18 LAB — TSH: TSH: 2.149 u[IU]/mL (ref 0.350–4.500)

## 2018-06-18 LAB — GLUCOSE, CAPILLARY
Glucose-Capillary: 117 mg/dL — ABNORMAL HIGH (ref 70–99)
Glucose-Capillary: 127 mg/dL — ABNORMAL HIGH (ref 70–99)
Glucose-Capillary: 94 mg/dL (ref 70–99)
Glucose-Capillary: 105 mg/dL — ABNORMAL HIGH (ref 70–99)

## 2018-06-18 LAB — BRAIN NATRIURETIC PEPTIDE: B Natriuretic Peptide: 15.6 pg/mL (ref 0.0–100.0)

## 2018-06-18 LAB — TROPONIN I: Troponin I: <0.03 ng/mL (ref <0.03)

## 2018-06-18 MED ORDER — SPIRONOLACTONE 25 MG PO TABS
100.0000 mg | ORAL_TABLET | Freq: Every day | ORAL | Status: DC
  Administered 2018-06-18 – 2018-06-19 (×2): 100 mg via ORAL
  Filled 2018-06-18 (×2): qty 4

## 2018-06-18 MED ORDER — METOPROLOL SUCCINATE ER 25 MG PO TB24
25.0000 mg | ORAL_TABLET | Freq: Every day | ORAL | Status: DC
  Administered 2018-06-18 – 2018-06-19 (×2): 25 mg via ORAL
  Filled 2018-06-18 (×2): qty 1

## 2018-06-18 MED ORDER — IPRATROPIUM-ALBUTEROL 0.5-2.5 (3) MG/3ML IN SOLN: 3.0000 mL | Freq: Four times a day (QID) | RESPIRATORY_TRACT | Status: DC

## 2018-06-18 MED ORDER — FENTANYL CITRATE (PF) 100 MCG/2ML IJ SOLN
50.0000 ug | INTRAMUSCULAR | Status: DC | PRN
  Administered 2018-06-18 (×2): 50 ug via INTRAVENOUS
  Filled 2018-06-18 (×2): qty 2

## 2018-06-18 MED ORDER — PRAVASTATIN SODIUM 40 MG PO TABS
40.0000 mg | ORAL_TABLET | Freq: Every evening | ORAL | Status: DC
  Administered 2018-06-18: 40 mg via ORAL
  Filled 2018-06-18: qty 1

## 2018-06-18 MED ORDER — LORATADINE 10 MG PO TABS
10.0000 mg | ORAL_TABLET | Freq: Every day | ORAL | Status: DC
  Administered 2018-06-18 – 2018-06-19 (×2): 10 mg via ORAL
  Filled 2018-06-18 (×2): qty 1

## 2018-06-18 MED ORDER — FENTANYL CITRATE (PF) 100 MCG/2ML IJ SOLN: 12.5000 ug | INTRAMUSCULAR | Status: DC | PRN

## 2018-06-18 MED ORDER — PANTOPRAZOLE SODIUM 40 MG PO TBEC
40.0000 mg | DELAYED_RELEASE_TABLET | Freq: Every day | ORAL | Status: DC
  Administered 2018-06-18 – 2018-06-19 (×2): 40 mg via ORAL
  Filled 2018-06-18 (×2): qty 1

## 2018-06-18 MED ORDER — FUROSEMIDE 10 MG/ML IJ SOLN
40.0000 mg | Freq: Two times a day (BID) | INTRAMUSCULAR | Status: DC
  Administered 2018-06-18 – 2018-06-19 (×3): 40 mg via INTRAVENOUS
  Filled 2018-06-18 (×3): qty 4

## 2018-06-18 MED ORDER — POTASSIUM CHLORIDE CRYS ER 20 MEQ PO TBCR
20.0000 meq | EXTENDED_RELEASE_TABLET | Freq: Every day | ORAL | Status: DC
  Administered 2018-06-18 – 2018-06-19 (×2): 20 meq via ORAL
  Filled 2018-06-18 (×2): qty 1

## 2018-06-18 MED ORDER — CALCITRIOL 0.5 MCG PO CAPS
0.5000 ug | ORAL_CAPSULE | Freq: Every day | ORAL | Status: DC
  Administered 2018-06-18 – 2018-06-19 (×2): 0.5 ug via ORAL
  Filled 2018-06-18 (×2): qty 1

## 2018-06-18 MED ORDER — HYDROCODONE-ACETAMINOPHEN 5-325 MG PO TABS
1.0000 | ORAL_TABLET | ORAL | Status: DC | PRN
  Administered 2018-06-18 – 2018-06-19 (×5): 1 via ORAL
  Filled 2018-06-18 (×5): qty 1

## 2018-06-18 MED ORDER — PREDNISONE 20 MG PO TABS: 40.0000 mg | ORAL_TABLET | Freq: Every day | ORAL | Status: DC

## 2018-06-18 MED ORDER — LEVOTHYROXINE SODIUM 112 MCG PO TABS
224.0000 ug | ORAL_TABLET | Freq: Every day | ORAL | Status: DC
  Administered 2018-06-18 – 2018-06-19 (×2): 224 ug via ORAL
  Filled 2018-06-18 (×3): qty 2

## 2018-06-18 MED ORDER — ALLOPURINOL 100 MG PO TABS
100.0000 mg | ORAL_TABLET | Freq: Every day | ORAL | Status: DC
  Administered 2018-06-18 – 2018-06-19 (×2): 100 mg via ORAL
  Filled 2018-06-18 (×2): qty 1

## 2018-06-18 MED ORDER — LISINOPRIL 5 MG PO TABS
5.0000 mg | ORAL_TABLET | Freq: Every day | ORAL | Status: DC
  Administered 2018-06-18 – 2018-06-19 (×2): 5 mg via ORAL
  Filled 2018-06-18 (×2): qty 1

## 2018-06-18 MED ORDER — IPRATROPIUM-ALBUTEROL 0.5-2.5 (3) MG/3ML IN SOLN
3.0000 mL | Freq: Three times a day (TID) | RESPIRATORY_TRACT | Status: DC
  Administered 2018-06-18 – 2018-06-19 (×4): 3 mL via RESPIRATORY_TRACT
  Filled 2018-06-18 (×4): qty 3

## 2018-06-18 MED ORDER — FERROUS SULFATE 325 (65 FE) MG PO TABS
325.0000 mg | ORAL_TABLET | Freq: Every day | ORAL | Status: DC
  Administered 2018-06-18 – 2018-06-19 (×2): 325 mg via ORAL
  Filled 2018-06-18 (×2): qty 1

## 2018-06-18 MED ORDER — CARVEDILOL 6.25 MG PO TABS: 6.2500 mg | ORAL_TABLET | Freq: Two times a day (BID) | ORAL | Status: DC

## 2018-06-18 MED ORDER — CLONAZEPAM 1 MG PO TABS
2.0000 mg | ORAL_TABLET | Freq: Three times a day (TID) | ORAL | Status: DC
  Administered 2018-06-18 – 2018-06-19 (×4): 2 mg via ORAL
  Filled 2018-06-18 (×4): qty 2

## 2018-06-18 MED ORDER — ALBUTEROL SULFATE (2.5 MG/3ML) 0.083% IN NEBU: 3.0000 mL | INHALATION_SOLUTION | Freq: Four times a day (QID) | RESPIRATORY_TRACT | Status: DC | PRN

## 2018-06-18 MED ORDER — METHOCARBAMOL 500 MG PO TABS: 500.0000 mg | ORAL_TABLET | Freq: Three times a day (TID) | ORAL | Status: DC | PRN

## 2018-06-18 MED ORDER — INSULIN ASPART 100 UNIT/ML ~~LOC~~ SOLN
0.0000 [IU] | Freq: Three times a day (TID) | SUBCUTANEOUS | Status: DC
  Administered 2018-06-18 – 2018-06-19 (×3): 1 [IU] via SUBCUTANEOUS

## 2018-06-18 MED ORDER — METOCLOPRAMIDE HCL 10 MG PO TABS: 10.0000 mg | ORAL_TABLET | Freq: Four times a day (QID) | ORAL | Status: DC | PRN

## 2018-06-18 MED ORDER — UMECLIDINIUM-VILANTEROL 62.5-25 MCG/INH IN AEPB
1.0000 | INHALATION_SPRAY | Freq: Every day | RESPIRATORY_TRACT | Status: DC
  Administered 2018-06-18 – 2018-06-19 (×2): 1 via RESPIRATORY_TRACT
  Filled 2018-06-18: qty 14

## 2018-06-18 NOTE — Plan of Care (Signed)

## 2018-06-18 NOTE — H&P (Signed)
History and Physical    Aanika Defoor ZOX:[id number] DOB: 30-Mar-1961 DOA: 06/17/2018  PCP: [name], MD  Patient coming from: Home.  Chief Complaint: Fall.  HPI: Ketzaly Cardella is a 58 y.o. female with history of diastolic CHF, COPD, diabetes mellitus, gout, morbid obesity had a fall at home on June 16, 2018 in the evening.  Patient had to call back to get into her bed.  Since then she has been hurting in the low back and left shoulder.  Her home health aide came yesterday and instructed her to go to the urgent care where patient was found to have increasing pain to the left shoulder and concerning for history of left shoulder dislocation was instructed to go to the ER.  Patient over the last 1 week has noticed increasing lower extremity swelling more than usual despite taking diuretic.  Denies any chest pain productive cough fever chills.  When patient fell she did not hit her head or lose consciousness.  ED Course: In the ER on exam patient does have significant lower extremity swelling.  CT scan of the left shoulder and pelvis were done which did not show anything acute.  X-ray of the lumbar spine does not show anything acute.  On exam patient does have a small ecchymotic area in the low spine area.  With mild tenderness.  Significant pain and unable to walk because of the pain and also increasing lower extremity edema patient admitted for further management of pain and lower extremity edema.  Chest x-ray was unremarkable.  BNP was 15.6.  Troponin was negative.  Review of Systems: As per HPI, rest all negative.   Past Medical History:  Diagnosis Date  . Anxiety   . Arthritis   . Asthma   . Chronic diastolic CHF (congestive heart failure) ([place])   . COPD (chronic obstructive pulmonary disease) (HCC)    Uses Oxygen at night  . Depression   . Diabetes mellitus without complication ([place])   . Dyspnea    occ  . Family history of thyroid cancer   . GERD (gastroesophageal reflux  disease)   . Gout   . Headache    migraines  . History of DVT of lower extremity   . History of nuclear stress test    Myoview 2/17:  Low risk stress nuclear study with a small, moderate intensity, partially reversible inferior lateral defect consistent with small prior infarct and minimal peri-infarct ischemia; EF 68 with normal wall motion  . Hypertension   . Pneumonia   . Thyroid cancer ([place]) 09/23/2016    Past Surgical History:  Procedure Laterality Date  . BUNIONECTOMY Bilateral   . CARDIAC CATHETERIZATION N/A 06/23/2015   Procedure: Right/Left Heart Cath and Coronary Angiography;  Surgeon: [name], MD;  Location: [facility];  Service: Cardiovascular;  Laterality: N/A;  . COLONOSCOPY WITH PROPOFOL N/A 12/15/2015   Procedure: COLONOSCOPY WITH PROPOFOL;  Surgeon: [name], MD;  Location: [facility];  Service: Endoscopy;  Laterality: N/A;  . THYROIDECTOMY  09/19/2016  . THYROIDECTOMY N/A 09/19/2016   Procedure: TOTAL THYROIDECTOMY;  Surgeon: [name], MD;  Location: [facility];  Service: General;  Laterality: N/A;  . TONSILLECTOMY    . TOTAL ABDOMINAL HYSTERECTOMY  07/14/10     reports that she quit smoking about 23 months ago. Her smoking use included cigarettes. She has a 20.50 pack-year smoking history. She has never used smokeless tobacco. She reports previous alcohol use. She reports that she does not use  drugs.  Allergies  Allergen Reactions  . Bee Venom Swelling and Other (See Comments)    "All over my body" (swelling)  . Fioricet [Butalbital-Apap-Caffeine] Nausea And Vomiting and Rash  . Ibuprofen Rash and Other (See Comments)    Severe rash  . Lamisil [Terbinafine] Rash and Other (See Comments)    Pt states this causes her to "feel funny"  . Nsaids Other (See Comments)    Per MD's orders     Family History  Problem Relation Age of Onset  . Cancer Father        thought to be due to exposure to concrete  . Diabetes Mother   . Thyroid cancer Mother          dx in her 106s-60s  . Cancer Maternal Uncle        2 uncles with cancer NOS  . Brain cancer Paternal Aunt   . Cancer Cousin        maternal first cousin - NOS  . Cancer Cousin        maternal first cousin - NOS    Prior to Admission medications   Medication Sig Start Date End Date Taking? Authorizing Provider  albuterol (PROVENTIL HFA;VENTOLIN HFA) 108 (90 Base) MCG/ACT inhaler Inhale 1-2 puffs into the lungs every 6 (six) hours as needed for wheezing or shortness of breath. 04/23/18  Yes Long, [name], MD  ALLERGY RELIEF 10 MG tablet Take 10 mg by mouth daily. 04/11/18  Yes [provider]  allopurinol (ZYLOPRIM) 100 MG tablet Take 100 mg by mouth daily.   Yes [provider]  ANORO ELLIPTA 62.5-25 MCG/INH AEPB Inhale 1 puff into the lungs daily.  04/11/18  Yes [provider]  butalbital-acetaminophen-caffeine (FIORICET, ESGIC) 50-325-40 MG tablet Take 1-2 tablets by mouth every 4 (four) hours as needed for migraine. 06/13/18  Yes [provider]  calcitRIOL (ROCALTROL) 0.5 MCG capsule Take 1 capsule (0.5 mcg total) by mouth daily. 09/26/16  Yes Hongalgi, [name], MD  carvedilol (COREG) 6.25 MG tablet Take 1 tablet (6.25 mg total) by mouth 2 (two) times daily with a meal. 01/23/18  Yes Bensimhon, [name], MD  cetirizine (ZYRTEC) 10 MG tablet Take 10 mg by mouth daily. 07/10/17  Yes [provider]  clonazePAM (KLONOPIN) 2 MG tablet Take 2 mg by mouth 3 (three) times daily. for anxiety 05/08/17  Yes [provider]  diclofenac sodium (VOLTAREN) 1 % GEL Apply 2 g topically 4 (four) times daily as needed (for pain).    Yes [provider]  EPINEPHrine 0.3 mg/0.3 mL IJ SOAJ injection Inject 0.3 mg into the muscle daily as needed (allergic reaction).   Yes [provider]  FEROSUL 325 (65 Fe) MG tablet Take 325 mg by mouth daily. 03/22/17  Yes [provider]  HYDROcodone-acetaminophen (NORCO/VICODIN) 5-325 MG tablet  Take 1 tablet by mouth every 6 (six) hours as needed for moderate pain.  02/12/18  Yes [provider]  ipratropium-albuterol (DUONEB) 0.5-2.5 (3) MG/3ML SOLN Take 3 mLs by nebulization 4 (four) times daily.  04/05/17  Yes [provider]  levothyroxine (SYNTHROID, LEVOTHROID) 112 MCG tablet Take 224 mcg by mouth daily. 07/11/17  Yes [provider]  lisinopril (PRINIVIL,ZESTRIL) 5 MG tablet Take 1 tablet (5 mg total) by mouth daily. Please call for office visit [phone] 08/15/17  Yes [name], MD  loperamide (IMODIUM) 2 MG capsule Take 1 capsule (2 mg total) by mouth 4 (four) times daily as  needed for diarrhea or loose stools. 6/65/99  Yes [name], MD  metFORMIN (GLUCOPHAGE) 500 MG tablet Take 1 tablet (500 mg total) by mouth 2 (two) times daily with a meal. 12/11/14  Yes [name], MD  metoCLOPramide (REGLAN) 10 MG tablet Take 1 tablet (10 mg total) by mouth every 6 (six) hours as needed for nausea (or headache). 3/57/01  Yes [name], MD  metoprolol succinate (TOPROL-XL) 25 MG 24 hr tablet Take 1 tablet (25 mg total) by mouth daily. 08/07/17  Yes [name], DO  nystatin cream (MYCOSTATIN) Apply 1 application topically 2 (two) times daily as needed for dry skin.  04/13/17  Yes [provider]  omeprazole (PRILOSEC) 40 MG capsule Take 40 mg by mouth daily. 04/13/15  Yes [provider]  OXYGEN Inhale 2 L into the lungs at bedtime.   Yes [provider]  potassium chloride SA (K-DUR,KLOR-CON) 20 MEQ tablet Take 2 tablets (40 mEq total) by mouth daily. Patient taking differently: Take 20 mEq by mouth daily.  08/01/16  Yes [name], MD  pravastatin (PRAVACHOL) 40 MG tablet Take 40 mg by mouth every evening.  03/29/15  Yes [provider]  QC STOOL SOFTENER PLS LAXATIVE 8.6-50 MG tablet Take 1 tablet by mouth at bedtime. 04/11/17  Yes [provider]  spironolactone (ALDACTONE) 100 MG tablet Take 100 mg  by mouth daily. 10/02/17  Yes [provider]  tiotropium (SPIRIVA) 18 MCG inhalation capsule Place 18 mcg into inhaler and inhale daily.   Yes [provider]  Blood Glucose Monitoring Suppl (ACCU-CHEK AVIVA PLUS) w/Device KIT USE AS DIRECTED TO CHECK BLOOD SUGAR 08/07/17   [provider]  glucose blood test strip Use as instructed to check blood sugar up to 4 times daily 08/06/17   [name], DO    Physical Exam: Vitals:   06/17/18 2257 06/18/18 0009 06/18/18 0041 06/18/18 0335  BP: 104/62 106/74 105/68 109/60  Pulse: 85 78 77 91  Resp: _0 Temp:   98.3 F (36.8 C) 98.2 F (36.8 C)  TempSrc:   Oral Oral  SpO2: 97% 99% 100% 93%      Constitutional: Moderately built and nourished. Vitals:   06/17/18 2257 06/18/18 0009 06/18/18 0041 06/18/18 0335  BP: 104/62 106/74 105/68 109/60  Pulse: 85 78 77 91  Resp: _1 Temp:   98.3 F (36.8 C) 98.2 F (36.8 C)  TempSrc:   Oral Oral  SpO2: 97% 99% 100% 93%   Eyes: Anicteric no pallor. ENMT: No discharge from the ears eyes nose and mouth. Neck: No mass felt.  No neck rigidity. Respiratory: No rhonchi or crepitations. Cardiovascular: S1-S2 heard. Abdomen: Soft nontender bowel sounds present. Musculoskeletal: Bilateral lower extremity edema present. Skin: No rash.  Neurologic: Alert awake oriented to time place and person.  Moves all extremities. Psychiatric: Appears normal.  Normal affect.   Labs on Admission: I have personally reviewed following labs and imaging studies  CBC: Recent Labs  Lab 06/17/18 2122  WBC 13.8*  NEUTROABS 10.3*  HGB 12.0  HCT 39.6  MCV 94.1  PLT 779   Basic Metabolic Panel: Recent Labs  Lab 06/17/18 2122  NA 136  K 4.0  CL 98  CO2 29  GLUCOSE 126*  BUN 7  CREATININE 0.58  CALCIUM 8.6*   GFR: Estimated Creatinine Clearance: 109.9 mL/min (by C-G formula based on SCr of 0.58 mg/dL). Liver Function Tests: No results for  input(s): AST, ALT,  ALKPHOS, BILITOT, PROT, ALBUMIN in the last 168 hours. No results for input(s): LIPASE, AMYLASE in the last 168 hours. No results for input(s): AMMONIA in the last 168 hours. Coagulation Profile: No results for input(s): INR, PROTIME in the last 168 hours. Cardiac Enzymes: Recent Labs  Lab 06/17/18 2122 06/17/18 2326  CKTOTAL 132  --   TROPONINI  --  <0.03   BNP (last 3 results) No results for input(s): PROBNP in the last 8760 hours. HbA1C: No results for input(s): HGBA1C in the last 72 hours. CBG: No results for input(s): GLUCAP in the last 168 hours. Lipid Profile: No results for input(s): CHOL, HDL, LDLCALC, TRIG, CHOLHDL, LDLDIRECT in the last 72 hours. Thyroid Function Tests: No results for input(s): TSH, T4TOTAL, FREET4, T3FREE, THYROIDAB in the last 72 hours. Anemia Panel: No results for input(s): VITAMINB12, FOLATE, FERRITIN, TIBC, IRON, RETICCTPCT in the last 72 hours. Urine analysis:    Component Value Date/Time   COLORURINE YELLOW 10/23/2017 0354   APPEARANCEUR HAZY (A) 10/23/2017 0354   LABSPEC 1.013 10/23/2017 0354   PHURINE 5.0 10/23/2017 0354   GLUCOSEU NEGATIVE 10/23/2017 0354   HGBUR NEGATIVE 10/23/2017 0354   BILIRUBINUR NEGATIVE 10/23/2017 0354   KETONESUR NEGATIVE 10/23/2017 0354   PROTEINUR NEGATIVE 10/23/2017 0354   UROBILINOGEN 0.2 02/28/2012 2004   NITRITE NEGATIVE 10/23/2017 0354   LEUKOCYTESUR NEGATIVE 10/23/2017 0354   Sepsis Labs: _0 (procalcitonin:4,lacticidven:4) )No results found for this or any previous visit (from the past 240 hour(s)).   Radiological Exams on Admission: Dg Thoracic Spine 2 View  Result Date: 06/17/2018 CLINICAL DATA:  58 y/o F; fall today with upper and lower back pain. EXAM: THORACIC SPINE 2 VIEWS; LUMBAR SPINE - COMPLETE 4+ VIEW COMPARISON:  None. FINDINGS: Thoracic spine: Twelve paired thoracic ribs. Surgical clips project over the lower neck. Multilevel bridging endplate osteophytes. Lumbar spine: Five lumbar  vertebral bodies. No pars defect. Normal lumbar lordosis without listhesis. No loss of vertebral body or disc space height. Small multilevel endplate marginal osteophytes. IMPRESSION: 1. No acute fracture or malalignment. 2. Multilevel bridging endplate osteophytes of thoracic spine and mild discogenic degenerative changes of lumbar spine. Electronically Signed   By: Kristine Garbe M.D.   On: 06/17/2018 22:38   Dg Lumbar Spine Complete  Result Date: 06/17/2018 CLINICAL DATA:  58 y/o F; fall today with upper and lower back pain. EXAM: THORACIC SPINE 2 VIEWS; LUMBAR SPINE - COMPLETE 4+ VIEW COMPARISON:  None. FINDINGS: Thoracic spine: Twelve paired thoracic ribs. Surgical clips project over the lower neck. Multilevel bridging endplate osteophytes. Lumbar spine: Five lumbar vertebral bodies. No pars defect. Normal lumbar lordosis without listhesis. No loss of vertebral body or disc space height. Small multilevel endplate marginal osteophytes. IMPRESSION: 1. No acute fracture or malalignment. 2. Multilevel bridging endplate osteophytes of thoracic spine and mild discogenic degenerative changes of lumbar spine. Electronically Signed   By: Kristine Garbe M.D.   On: 06/17/2018 22:38   Dg Pelvis 1-2 Views  Result Date: 06/17/2018 CLINICAL DATA:  Pelvic pain after fall EXAM: PELVIS - 1-2 VIEW COMPARISON:  None. FINDINGS: No fracture or dislocation of the left hip. There is lucency of the medial aspect of the left inferior pubic ramus. No other osseous abnormality. IMPRESSION: 1. Lucency of the medial aspect of the left inferior pubic ramus is favored to be a composite shadow artifact, though a minimally displaced fracture could also have this appearance. CT of the pelvis would be confirmatory. 2. No fracture or dislocation  of the hips. Electronically Signed   By: Ulyses Jarred M.D.   On: 06/17/2018 22:42   Ct Pelvis Wo Contrast  Result Date: 06/17/2018 CLINICAL DATA:  Fall with pelvis trauma  EXAM: CT PELVIS WITHOUT CONTRAST TECHNIQUE: Multidetector CT imaging of the pelvis was performed following the standard protocol without intravenous contrast. COMPARISON:  06/17/2018 radiograph, CT 06/06/2017 FINDINGS: Urinary Tract:  No abnormality visualized. Bowel:  Unremarkable visualized pelvic bowel loops. Vascular/Lymphatic: Nonaneurysmal.  No significant pelvic adenopathy Reproductive: Status post hysterectomy. No suspicious adnexal mass. Other:  Negative for pelvic effusion Musculoskeletal: No acute or suspicious abnormality IMPRESSION: No CT evidence for acute intrapelvic abnormality. Electronically Signed   By: Donavan Foil M.D.   On: 06/17/2018 23:48   Ct Shoulder Left Wo Contrast  Result Date: 06/17/2018 CLINICAL DATA:  58 year old female status post fall onto left shoulder last night. Pain. EXAM: CT OF THE UPPER LEFT EXTREMITY WITHOUT CONTRAST TECHNIQUE: Multidetector CT imaging of the upper left extremity was performed according to the standard protocol. COMPARISON:  Left shoulder radiographs today.  CTA chest 06/09/2018. FINDINGS: No glenohumeral dislocation. Intact proximal left humerus and visible humerus shaft. Intact left scapula. Intact left clavicle. Normal acromioclavicular joint alignment. Degenerative changes at the left [facility] joint with small subchondral cysts and degenerative spurring. Soft tissue surrounding the left shoulder are within normal limits aside from a benign appearing intramuscular lipoma of the left pectoralis on series 3, image 27. No left axillary lymphadenopathy. Visible left ribs and spine appear stable. Sequelae of thyroidectomy. Stable visible mediastinum. Stable visible left lung parenchyma. IMPRESSION: No acute traumatic injury identified about the left shoulder. Electronically Signed   By: Genevie Ann M.D.   On: 06/17/2018 22:41   Dg Chest Portable 1 View  Result Date: 06/17/2018 CLINICAL DATA:  Dyspnea EXAM: PORTABLE CHEST 1 VIEW COMPARISON:  06/09/2018, 04/23/2018,  CT 06/09/2018 FINDINGS: Cardiomegaly with enlarged pulmonary arteries and vascular congestion. Similar appearance of streaky bibasilar scarring. Slight increased hazy opacity at the bases. No pneumothorax. Postsurgical changes at the thoracic inlet. No pneumothorax. IMPRESSION: 1. Cardiomegaly with vascular congestion. Enlarged appearing central pulmonary vessels, possible hypertension 2. Chronic linear atelectasis or scarring at the bases. Slight increased hazy opacity may reflect superimposed edema or mild infiltrates. Electronically Signed   By: Donavan Foil M.D.   On: 06/17/2018 23:52   Dg Shoulder Left  Result Date: 06/17/2018 CLINICAL DATA:  Pain after fall last night EXAM: LEFT SHOULDER - 2+ VIEW COMPARISON:  None. FINDINGS: Mild AC joint degenerative changes.  No fracture or dislocation. IMPRESSION: Negative. Electronically Signed   By: Dorise Bullion III M.D   On: 06/17/2018 14:31      Assessment/Plan Active Problems:   Controlled type 2 diabetes mellitus without complication, without long-term current use of insulin (HCC)   Benign essential HTN   Pulmonary hypertension (HCC)   Chronic combined systolic (congestive) and diastolic (congestive) heart failure (HCC)   Gout   Acute on chronic diastolic heart failure (HCC)   Pain   Sprain of shoulder, left   Strain of thoracic back region   Strain of lumbar region   CHF (congestive heart failure) ([place])    1. Bilateral lower extremity edema likely worsening of her known CHF acute on chronic diastolic CHF last EF measured in April 2019 was 60 to 65% with grade 1 diastolic dysfunction.  Patient states she takes diuretics at home but does not recall the name and is not listed in medication list.  I have placed patient  on Lasix 40 mg IV every 12.  Please contact her primary care physician Dr. Jonelle Sidle to check what her diuretic was.  We will continue spironolactone EKG is pending.  Closely follow intake output daily weights and metabolic panel.   Check Dopplers of the lower extremity. 2. Fall with low back pain will check MRI T-spine.  There is an ecchymotic area in the low back and tender spot.  Further recommendation based on MRI findings. 3. History of COPD presently not wheezing continue inhalers. 4. Diabetes mellitus type 2 we will keep patient on sliding scale coverage. 5. History of gout on allopurinol. 6. Hypertension we will continue Toprol lisinopril spironolactone and Lasix.   DVT prophylaxis: SCDs for now until MRI results available. Code Status: Full code. Family Communication: Patient's daughter at the bedside. Disposition Plan: Home. Consults called: None. Admission status: Observation.   Rise Patience MD [facility] Pager [phone].  If 7PM-7AM, please contact night-coverage [email or web address] Password [email or web address]  06/18/2018, 4:05 AM

## 2018-06-18 NOTE — ED Notes (Signed)
ED TO INPATIENT HANDOFF REPORT  ED Nurse Name and Phone #: Herbie Baltimore RN [phone]  S Name/Age/Gender Michele Owens 58 y.o. female Room/Bed: 036C/036C  Code Status Full Code  Home/SNF/Other HOME {Patient oriented KW:IOXBDZ/HGDJM/EQAS/TMHDQQIWL  Is this baseline? YES  Triage Complete: Triage complete  Chief Complaint poss dislocated shoulder/sent by uc  Triage Note Pt. Stated, I fell over a long oxygen cord last night, my back is also hurting. I was at [facility] and they brought me down here.     Allergies Allergies  Allergen Reactions  . Bee Venom Swelling and Other (See Comments)    "All over my body" (swelling)  . Fioricet [Butalbital-Apap-Caffeine] Nausea And Vomiting and Rash  . Ibuprofen Rash and Other (See Comments)    Severe rash  . Lamisil [Terbinafine] Rash and Other (See Comments)    Pt states this causes her to "feel funny"  . Nsaids Other (See Comments)    Per MD's orders     Level of Care/Admitting Diagnosis ED Disposition    ED Disposition Condition Comment   Admit  Hospital Area: [place] [[id number]]  Level of Care: Medical Telemetry [104]  I expect the patient will be discharged within 24 hours: No (not a candidate for 5C-Observation unit)  Diagnosis: Pain [194174]  Admitting Physician: Rise Patience [phone]  Attending Physician: Rise Patience [[id number]]  PT Class (Do Not Modify): Observation [104]  PT Acc Code (Do Not Modify): Observation [10022]       B Medical/Surgery History Past Medical History:  Diagnosis Date  . Anxiety   . Arthritis   . Asthma   . Chronic diastolic CHF (congestive heart failure) ([place])   . COPD (chronic obstructive pulmonary disease) (H[facility])    Uses Oxygen at night  . Depression   . Diabetes mellitus without complication ([place])   . Dyspnea    occ  . Family history of thyroid cancer   . GERD (gastroesophageal reflux disease)   . Gout   . Headache    migraines  . History of DVT of lower  extremity   . History of nuclear stress test    Myoview 2/17:  Low risk stress nuclear study with a small, moderate intensity, partially reversible inferior lateral defect consistent with small prior infarct and minimal peri-infarct ischemia; EF 68 with normal wall motion  . Hypertension   . Pneumonia   . Thyroid cancer ([place]) 09/23/2016   Past Surgical History:  Procedure Laterality Date  . BUNIONECTOMY Bilateral   . CARDIAC CATHETERIZATION N/A 06/23/2015   Procedure: Right/Left Heart Cath and Coronary Angiography;  Surgeon: [name], MD;  Location: [facility];  Service: Cardiovascular;  Laterality: N/A;  . COLONOSCOPY WITH PROPOFOL N/A 12/15/2015   Procedure: COLONOSCOPY WITH PROPOFOL;  Surgeon: [name], MD;  Location: [place];  Service: Endoscopy;  Laterality: N/A;  . THYROIDECTOMY  09/19/2016  . THYROIDECTOMY N/A 09/19/2016   Procedure: TOTAL THYROIDECTOMY;  Surgeon: [name], MD;  Location: [place];  Service: General;  Laterality: N/A;  . TONSILLECTOMY    . TOTAL ABDOMINAL HYSTERECTOMY  07/14/10     A IV Location/Drains/Wounds Patient Lines/Drains/Airways Status   Active Line/Drains/Airways    Name:   Placement date:   Placement time:   Site:   Days:   Peripheral IV 06/17/18 Right Antecubital   06/17/18    2253    Antecubital   1   Incision (Closed) 09/19/16 Neck Other (Comment)   09/19/16  1093     637   Incision (Closed) 08/04/17 Foot Right;Left   08/04/17    -     318          Intake/Output Last 24 hours No intake or output data in the 24 hours ending 06/18/18 0011  Labs/Imaging Results for orders placed or performed during the hospital encounter of 06/17/18 (from the past 48 hour(s))  Basic metabolic panel     Status: Abnormal   Collection Time: 06/17/18  9:22 PM  Result Value Ref Range   Sodium 136 135 - 145 mmol/L   Potassium 4.0 3.5 - 5.1 mmol/L   Chloride 98 98 - 111 mmol/L   CO2 29 22 - 32 mmol/L   Glucose, Bld 126 (H) 70 - 99 mg/dL    BUN 7 6 - 20 mg/dL   Creatinine, Ser 0.58 0.44 - 1.00 mg/dL   Calcium 8.6 (L) 8.9 - 10.3 mg/dL   GFR calc non Af Amer >60 >60 mL/min   GFR calc Af Amer >60 >60 mL/min   Anion gap 9 5 - 15    Comment: Performed at [facility], [place] [address] [place], [place] [address]  CBC with Differential     Status: Abnormal   Collection Time: 06/17/18  9:22 PM  Result Value Ref Range   WBC 13.8 (H) 4.0 - 10.5 K/uL   RBC 4.21 3.87 - 5.11 MIL/uL   Hemoglobin 12.0 12.0 - 15.0 g/dL   HCT 39.6 36.0 - 46.0 %   MCV 94.1 80.0 - 100.0 fL   MCH 28.5 26.0 - 34.0 pg   MCHC 30.3 30.0 - 36.0 g/dL   RDW 13.2 11.5 - 15.5 %   Platelets 236 150 - 400 K/uL   nRBC 0.0 0.0 - 0.2 %   Neutrophils Relative % 75 %   Neutro Abs 10.3 (H) 1.7 - 7.7 K/uL   Lymphocytes Relative 17 %   Lymphs Abs 2.4 0.7 - 4.0 K/uL   Monocytes Relative 6 %   Monocytes Absolute 0.9 0.1 - 1.0 K/uL   Eosinophils Relative 1 %   Eosinophils Absolute 0.2 0.0 - 0.5 K/uL   Basophils Relative 0 %   Basophils Absolute 0.0 0.0 - 0.1 K/uL   Immature Granulocytes 1 %   Abs Immature Granulocytes 0.07 0.00 - 0.07 K/uL    Comment: Performed at [facility] [address] [place], [place] [address]  CK     Status: None   Collection Time: 06/17/18  9:22 PM  Result Value Ref Range   Total CK 132 38 - 234 U/L    Comment: Performed at [facility], [place] [address], [place],  [address]   Dg Thoracic Spine 2 View  Result Date: 06/17/2018 CLINICAL DATA:  58 y/o F; fall today with upper and lower back pain. EXAM: THORACIC SPINE 2 VIEWS; LUMBAR SPINE - COMPLETE 4+ VIEW COMPARISON:  None. FINDINGS: Thoracic spine: Twelve paired thoracic ribs. Surgical clips project over the lower neck. Multilevel bridging endplate osteophytes. Lumbar spine: Five lumbar vertebral bodies. No pars defect. Normal lumbar lordosis without listhesis. No loss of vertebral body or disc space height. Small multilevel endplate marginal osteophytes. IMPRESSION: 1.  No acute fracture or malalignment. 2. Multilevel bridging endplate osteophytes of thoracic spine and mild discogenic degenerative changes of lumbar spine. Electronically Signed   By: Kristine Garbe M.D.   On: 06/17/2018 22:38   Dg Lumbar Spine Complete  Result Date: 06/17/2018 CLINICAL  DATA:  58 y/o F; fall today with upper and lower back pain. EXAM: THORACIC SPINE 2 VIEWS; LUMBAR SPINE - COMPLETE 4+ VIEW COMPARISON:  None. FINDINGS: Thoracic spine: Twelve paired thoracic ribs. Surgical clips project over the lower neck. Multilevel bridging endplate osteophytes. Lumbar spine: Five lumbar vertebral bodies. No pars defect. Normal lumbar lordosis without listhesis. No loss of vertebral body or disc space height. Small multilevel endplate marginal osteophytes. IMPRESSION: 1. No acute fracture or malalignment. 2. Multilevel bridging endplate osteophytes of thoracic spine and mild discogenic degenerative changes of lumbar spine. Electronically Signed   By: Kristine Garbe M.D.   On: 06/17/2018 22:38   Dg Pelvis 1-2 Views  Result Date: 06/17/2018 CLINICAL DATA:  Pelvic pain after fall EXAM: PELVIS - 1-2 VIEW COMPARISON:  None. FINDINGS: No fracture or dislocation of the left hip. There is lucency of the medial aspect of the left inferior pubic ramus. No other osseous abnormality. IMPRESSION: 1. Lucency of the medial aspect of the left inferior pubic ramus is favored to be a composite shadow artifact, though a minimally displaced fracture could also have this appearance. CT of the pelvis would be confirmatory. 2. No fracture or dislocation of the hips. Electronically Signed   By: Ulyses Jarred M.D.   On: 06/17/2018 22:42   Ct Pelvis Wo Contrast  Result Date: 06/17/2018 CLINICAL DATA:  Fall with pelvis trauma EXAM: CT PELVIS WITHOUT CONTRAST TECHNIQUE: Multidetector CT imaging of the pelvis was performed following the standard protocol without intravenous contrast. COMPARISON:  06/17/2018  radiograph, CT 06/06/2017 FINDINGS: Urinary Tract:  No abnormality visualized. Bowel:  Unremarkable visualized pelvic bowel loops. Vascular/Lymphatic: Nonaneurysmal.  No significant pelvic adenopathy Reproductive: Status post hysterectomy. No suspicious adnexal mass. Other:  Negative for pelvic effusion Musculoskeletal: No acute or suspicious abnormality IMPRESSION: No CT evidence for acute intrapelvic abnormality. Electronically Signed   By: Donavan Foil M.D.   On: 06/17/2018 23:48   Ct Shoulder Left Wo Contrast  Result Date: 06/17/2018 CLINICAL DATA:  58 year old female status post fall onto left shoulder last night. Pain. EXAM: CT OF THE UPPER LEFT EXTREMITY WITHOUT CONTRAST TECHNIQUE: Multidetector CT imaging of the upper left extremity was performed according to the standard protocol. COMPARISON:  Left shoulder radiographs today.  CTA chest 06/09/2018. FINDINGS: No glenohumeral dislocation. Intact proximal left humerus and visible humerus shaft. Intact left scapula. Intact left clavicle. Normal acromioclavicular joint alignment. Degenerative changes at the left [facility] joint with small subchondral cysts and degenerative spurring. Soft tissue surrounding the left shoulder are within normal limits aside from a benign appearing intramuscular lipoma of the left pectoralis on series 3, image 27. No left axillary lymphadenopathy. Visible left ribs and spine appear stable. Sequelae of thyroidectomy. Stable visible mediastinum. Stable visible left lung parenchyma. IMPRESSION: No acute traumatic injury identified about the left shoulder. Electronically Signed   By: Genevie Ann M.D.   On: 06/17/2018 22:41   Dg Chest Portable 1 View  Result Date: 06/17/2018 CLINICAL DATA:  Dyspnea EXAM: PORTABLE CHEST 1 VIEW COMPARISON:  06/09/2018, 04/23/2018, CT 06/09/2018 FINDINGS: Cardiomegaly with enlarged pulmonary arteries and vascular congestion. Similar appearance of streaky bibasilar scarring. Slight increased hazy opacity at the  bases. No pneumothorax. Postsurgical changes at the thoracic inlet. No pneumothorax. IMPRESSION: 1. Cardiomegaly with vascular congestion. Enlarged appearing central pulmonary vessels, possible hypertension 2. Chronic linear atelectasis or scarring at the bases. Slight increased hazy opacity may reflect superimposed edema or mild infiltrates. Electronically Signed   By: Madie Reno.D.  On: 06/17/2018 23:52   Dg Shoulder Left  Result Date: 06/17/2018 CLINICAL DATA:  Pain after fall last night EXAM: LEFT SHOULDER - 2+ VIEW COMPARISON:  None. FINDINGS: Mild AC joint degenerative changes.  No fracture or dislocation. IMPRESSION: Negative. Electronically Signed   By: Dorise Bullion III M.D   On: 06/17/2018 14:31    Pending Labs Unresulted Labs (From admission, onward)    Start     Ordered   06/17/18 2326  Brain natriuretic peptide  Once,   STAT     06/17/18 2326   06/17/18 2326  Troponin I - Once  Once,   STAT     06/17/18 2326          Vitals/Pain Today's Vitals   06/17/18 2257 06/17/18 2319 06/18/18 0009 06/18/18 0009  BP: 104/62  106/74   Pulse: 85  78   Resp: 16  18   Temp:      TempSrc:      SpO2: 97%  99%   PainSc: 8  7  0-No pain 0-No pain    Isolation Precautions No active isolations  Medications Medications  fentaNYL (SUBLIMAZE) injection 50 mcg (50 mcg Intravenous Given 06/17/18 2259)  clonazePAM (KLONOPIN) tablet 2 mg (2 mg Oral Given 06/17/18 2335)  HYDROmorphone (DILAUDID) injection 0.5 mg (0.5 mg Intravenous Given 06/17/18 2342)    Mobility walks with device Moderate fall risk   Focused Assessments EXTENSIVE ASSISTANCE WHEN MOVING TO/FROM BED - USES CANE .   R Recommendations: See Admitting Provider Note  Report given to:   Additional Notes: DENIES PAIN AT THIS TIME/RESPIRATIONS UNLABORED , IV SITE INTACT .

## 2018-06-18 NOTE — Progress Notes (Signed)
Bilateral lower extremities venous duplex exam completed. Please see preliminary notes on CV PROC under chart review. Imogen Maddalena H Lazarius Rivkin(RDMS RVT) 06/18/18 .11:27 AM

## 2018-06-18 NOTE — Progress Notes (Addendum)
Patient admitted after midnight with acute on chronic diastolic HF and worsening chronic back pain and new shoulder pain after a fall a week ago.   Patient had fall 06/16/18. Since then new left shoulder pain and worsening back pain.also worsening LE edema. Unclear if compliant with home diuretics  A/P 1. Acute on chronic diastolic heart failure -IV lasix BID -home spironolactone -intake and output -daily weight  2. Back pain/shoulder pain. Imaging reveals no acute dislocation or fx. Mri reveal small disc protrusions at T6-7, T9-10 and T10-11. Normal appearance of thoracic spine. Appeared quite lethargic on exam this afternoon.  -decrease sedating meds -physical therapy -mobilize  3. COPD. Reports using 2L ns as needed at home. No wheeze. Chest xray with vascular congestion -continue home inhalers -monitor oxygen saturation level  4. Diabetes. Serum glucose 126 on admission. Home meds include metformin  -obtain HgA1c -SSI for optimal control  5. Hypertension. Fair control. Home meds include toprol, lisinopril, lasix and spironolactone -continue home meds -monitor  Likely home tomorrow  Santiago Glad m. Black, NP

## 2018-06-19 ENCOUNTER — Other Ambulatory Visit: Payer: Self-pay

## 2018-06-19 DIAGNOSIS — M5124 Other intervertebral disc displacement, thoracic region: Secondary | ICD-10-CM

## 2018-06-19 DIAGNOSIS — J449 Chronic obstructive pulmonary disease, unspecified: Secondary | ICD-10-CM

## 2018-06-19 DIAGNOSIS — E119 Type 2 diabetes mellitus without complications: Secondary | ICD-10-CM | POA: Diagnosis not present

## 2018-06-19 DIAGNOSIS — I10 Essential (primary) hypertension: Secondary | ICD-10-CM | POA: Diagnosis not present

## 2018-06-19 DIAGNOSIS — I5033 Acute on chronic diastolic (congestive) heart failure: Secondary | ICD-10-CM | POA: Diagnosis not present

## 2018-06-19 DIAGNOSIS — Y92009 Unspecified place in unspecified non-institutional (private) residence as the place of occurrence of the external cause: Secondary | ICD-10-CM

## 2018-06-19 DIAGNOSIS — W19XXXA Unspecified fall, initial encounter: Secondary | ICD-10-CM | POA: Diagnosis not present

## 2018-06-19 LAB — GLUCOSE, CAPILLARY
Glucose-Capillary: 125 mg/dL — ABNORMAL HIGH (ref 70–99)
Glucose-Capillary: 123 mg/dL — ABNORMAL HIGH (ref 70–99)

## 2018-06-19 MED ORDER — FUROSEMIDE 20 MG PO TABS: 20.0000 mg | ORAL_TABLET | Freq: Every day | ORAL | 0 refills | Status: DC

## 2018-06-19 MED ORDER — GUAIFENESIN-DM 100-10 MG/5ML PO SYRP
5.0000 mL | ORAL_SOLUTION | ORAL | Status: DC | PRN
  Administered 2018-06-19: 5 mL via ORAL
  Filled 2018-06-19 (×2): qty 5

## 2018-06-19 MED ORDER — METHOCARBAMOL 500 MG PO TABS: 500.0000 mg | ORAL_TABLET | Freq: Three times a day (TID) | ORAL | 0 refills | Status: DC | PRN

## 2018-06-19 MED ORDER — DICLOFENAC SODIUM 1 % TD GEL: 2.0000 g | Freq: Four times a day (QID) | TRANSDERMAL | 0 refills | Status: DC | PRN

## 2018-06-19 MED ORDER — NITROGLYCERIN 0.4 MG SL SUBL: 0.4000 mg | SUBLINGUAL_TABLET | SUBLINGUAL | 0 refills | Status: DC | PRN

## 2018-06-19 NOTE — Care Management Note (Signed)
ase Management Note  Patient Details  Name: Michele Owens MRN: [id number] Date of Birth: 11-24-1960  Subjective/Objective:   58 yr old female being observed for right shoulder and back pain after falling out of her bed at home. Patient is oxygen dependent at home, has hx of CHF,COPD, DM type2.                  Action/Plan: Case manager spoke with patient concerning discharge plan. Referral for Home Health PT called to Neoma Laming, [facility] Liaison. Patient will have family support at discharge.    Expected Discharge Date:  06/19/18               Expected Discharge Plan:  [place]  In-House Referral:  NA  Discharge planning Services  CM Consult  Post Acute Care Choice:  Home Health Choice offered to:  Patient  DME Arranged:  N/A DME Agency:  NA  HH Arranged:  PT HH Agency:  [facility] ([facility])  Status of Service:  Completed, signed off  If discussed at [place] of Stay Meetings, dates discussed:    Additional Comments:  Ninfa Meeker, RN 06/19/2018, 3:35 PM

## 2018-06-19 NOTE — Plan of Care (Signed)
  Problem: Pain Managment: Goal: General experience of comfort will improve Outcome: Progressing   

## 2018-06-19 NOTE — Plan of Care (Signed)

## 2018-06-19 NOTE — Evaluation (Signed)
Physical Therapy Evaluation Patient Details Name: Michele Owens MRN: [id number] DOB: November 02, 1960 Today's Date: 06/19/2018   History of Present Illness   58 y.o. female with a Past Medical History of  CHF, COPD, DM type 2; back pain after fall in kitchen out of bed on 3/8.  Patient found to have signs of congestive heart failure for which we are diuresing with IV Lasix.  Xrays did not reveal any fx of shoulder or spine. MRI of the back only showed small disc protrusions at T6-7, T9-10 and T10-11. Normal appearance of thoracic spine.  Clinical Impression  Pt admitted with above diagnosis. Pt currently with functional limitations due to the deficits listed below (see PT Problem List). Pt is limited in safe mobility by decreased balance, strength and endurance. Pt requires minA for transfers and ambulation of 40 feet with RW. Pt will benefit from skilled PT to increase their independence and safety with mobility to allow discharge to the venue listed below.       Follow Up Recommendations Home health PT;Supervision for mobility/OOB    Equipment Recommendations  None recommended by PT       Precautions / Restrictions Precautions Precautions: Fall Precaution Comments: fall on 3/8  Restrictions Weight Bearing Restrictions: No      Mobility  Bed Mobility               General bed mobility comments: OOB in recliner  Transfers Overall transfer level: Needs assistance Equipment used: Rolling walker (2 wheeled) Transfers: Sit to/from Stand Sit to Stand: Min assist         General transfer comment: minA for power up and steadying with RW, vc for hand placement which pt did not follow despite maximal cuing  Ambulation/Gait Ambulation/Gait assistance: Min assist Gait Distance (Feet): 40 Feet Assistive device: Rolling walker (2 wheeled) Gait Pattern/deviations: Step-through pattern;Shuffle;Antalgic;Trunk flexed Gait velocity: slowed Gait velocity interpretation: <1.31 ft/sec,  indicative of household ambulator General Gait Details: minA for steadying with RW, and for management of O2 tank. no overt LoB however mildly unsteady      Balance Overall balance assessment: Needs assistance Sitting-balance support: Feet supported;No upper extremity supported Sitting balance-Leahy Scale: Fair     Standing balance support: Bilateral upper extremity supported Standing balance-Leahy Scale: Poor Standing balance comment: requires UE support for balance                             Pertinent Vitals/Pain Pain Assessment: 0-10 Pain Score: 7  Pain Location: L shoulder and back  Pain Descriptors / Indicators: Grimacing;Guarding;Aching;Sore Pain Intervention(s): Limited activity within patient's tolerance;Monitored during session;Repositioned    Home Living Family/patient expects to be discharged to:: Private residence Living Arrangements: Alone Available Help at Discharge: Personal care attendant(2-3 hours/ 7days/wk ) Type of Home: Apartment Home Access: Level entry     Home Layout: One level Home Equipment: Shower seat;Cane - single point;Tub bench      Prior Function Level of Independence: Needs assistance   Gait / Transfers Assistance Needed: use cane for limited household ambulation  ADL's / Homemaking Assistance Needed: HHAide 2-3 hrs/7days/wk        Hand Dominance        Extremity/Trunk Assessment   Upper Extremity Assessment Upper Extremity Assessment: LUE deficits/detail LUE Deficits / Details: L shoulder decreased ROM and strength due to falling on it 3/8  LUE: Unable to fully assess due to pain    Lower Extremity Assessment Lower Extremity  Assessment: Generalized weakness    Cervical / Trunk Assessment Cervical / Trunk Assessment: Other exceptions  Communication   Communication: No difficulties  Cognition Arousal/Alertness: Awake/alert Behavior During Therapy: WFL for tasks assessed/performed Overall Cognitive Status:  Within Functional Limits for tasks assessed                                 General Comments: pt very talkative, difficult to redirect      General Comments General comments (skin integrity, edema, etc.): Pt on 2L O2 at baseline, VSS.         Assessment/Plan    PT Assessment Patient needs continued PT services  PT Problem List Decreased strength;Decreased range of motion;Decreased activity tolerance;Decreased balance;Decreased mobility;Pain;Decreased safety awareness       PT Treatment Interventions DME instruction;Gait training;Functional mobility training;Therapeutic activities;Therapeutic exercise;Balance training;Patient/family education    PT Goals (Current goals can be found in the Care Plan section)  Acute Rehab PT Goals Patient Stated Goal: have less back pain PT Goal Formulation: With patient Time For Goal Achievement: 07/03/18 Potential to Achieve Goals: Fair    Frequency Min 3X/week   Barriers to discharge Decreased caregiver support         AM-PAC PT "6 Clicks" Mobility  Outcome Measure Help needed turning from your back to your side while in a flat bed without using bedrails?: A Little Help needed moving from lying on your back to sitting on the side of a flat bed without using bedrails?: A Little Help needed moving to and from a bed to a chair (including a wheelchair)?: A Little Help needed standing up from a chair using your arms (e.g., wheelchair or bedside chair)?: A Little Help needed to walk in hospital room?: A Little Help needed climbing 3-5 steps with a railing? : A Lot 6 Click Score: 17    End of Session Equipment Utilized During Treatment: Gait belt Activity Tolerance: Patient limited by pain Patient left: in chair;with call bell/phone within reach Nurse Communication: Mobility status;Precautions PT Visit Diagnosis: Unsteadiness on feet (R26.81);Other abnormalities of gait and mobility (R26.89);Muscle weakness (generalized)  (M62.81);Difficulty in walking, not elsewhere classified (R26.2);History of falling (Z91.81);Pain Pain - Right/Left: Left Pain - part of body: Shoulder(back)    Time: 3159-4585 PT Time Calculation (min) (ACUTE ONLY): 33 min   Charges:   PT Evaluation $PT Eval Low Complexity: 1 Low PT Treatments $Gait Training: 8-22 mins        Yitzchak Kothari B. Migdalia Dk PT, DPT Acute Rehabilitation Services Pager [phone] Office [phone]   [place] 06/19/2018, 1:50 PM

## 2018-06-19 NOTE — Discharge Summary (Addendum)
Michele Owens, is a 58 y.o. female  DOB 11/09/1960  MRN [id number].  Admission date:  06/17/2018  Admitting Physician  Rise Patience, MD  Discharge Date:  06/19/2018   Primary MD  [facility]  Recommendations for primary care physician for things to follow:     Discharge Diagnosis   Principal Problem:   Acute on chronic diastolic heart failure [facility]) Active Problems:   Controlled type 2 diabetes mellitus without complication, without long-term current use of insulin (HCC)   Benign essential HTN   Pulmonary hypertension (HCC)   COPD (chronic obstructive pulmonary disease) (HCC)   Gout   Pain   Sprain of shoulder, left   Strain of thoracic back region   Strain of lumbar region   Fall at home, initial encounter   HNP (herniated nucleus pulposus), thoracic     Past Medical History:  Diagnosis Date   Anxiety    Arthritis    Asthma    Chronic diastolic CHF (congestive heart failure) (HCC)    COPD (chronic obstructive pulmonary disease) (HCC)    Uses Oxygen at night   Depression    Diabetes mellitus without complication (HCC)    Dyspnea    occ   Family history of thyroid cancer    GERD (gastroesophageal reflux disease)    Gout    Headache    migraines   History of DVT of lower extremity    History of nuclear stress test    Myoview 2/17:  Low risk stress nuclear study with a small, moderate intensity, partially reversible inferior lateral defect consistent with small prior infarct and minimal peri-infarct ischemia; EF 68 with normal wall motion   Hypertension    Pneumonia    Thyroid cancer ([place]) 09/23/2016    Past Surgical History:  Procedure Laterality Date   BUNIONECTOMY Bilateral    CARDIAC CATHETERIZATION N/A 06/23/2015   Procedure: Right/Left Heart Cath and Coronary  Angiography;  Surgeon: [name], MD;  Location: [place] [facility];  Service: Cardiovascular;  Laterality: N/A;   COLONOSCOPY WITH PROPOFOL N/A 12/15/2015   Procedure: COLONOSCOPY WITH PROPOFOL;  Surgeon: [name], MD;  Location: [place];  Service: Endoscopy;  Laterality: N/A;   THYROIDECTOMY  09/19/2016   THYROIDECTOMY N/A 09/19/2016   Procedure: TOTAL THYROIDECTOMY;  Surgeon: [name], MD;  Location: [place];  Service: General;  Laterality: N/A;   TONSILLECTOMY     TOTAL ABDOMINAL HYSTERECTOMY  07/14/10       HPI  from the history and physical done on the day of admission:    HPI: Michele Owens is a 58 y.o. female with history of diastolic CHF, COPD, diabetes mellitus, gout, morbid obesity had a fall at home on June 16, 2018 in the evening.  Patient had to call back to get into her bed.  Since then she has been hurting in the low back and left shoulder.  Her home health aide came yesterday and instructed her to go to the urgent care where patient was found  to have increasing pain to the left shoulder and concerning for history of left shoulder dislocation was instructed to go to the ER.  Patient over the last 1 week has noticed increasing lower extremity swelling more than usual despite taking diuretic.  Denies any chest pain productive cough fever chills.  When patient fell she did not hit her head or lose consciousness.  ED Course: In the ER on exam patient does have significant lower extremity swelling.  CT scan of the left shoulder and pelvis were done which did not show anything acute.  X-ray of the lumbar spine does not show anything acute.  On exam patient does have a small ecchymotic area in the low spine area.  With mild tenderness.  Significant pain and unable to walk because of the pain and also increasing lower extremity edema patient admitted for further management of pain and lower extremity edema.  Chest x-ray was unremarkable.  BNP was 15.6.  Troponin was  negative.     Hospital Course:     1. Acute on chronic diastolic heart failure: Patient presented with complaints of worsening shortness of breath and leg swelling.  Last EF in April 2019 noted EF was 60 to 65% with grade 1 diastolic dysfunction.  BNP was only 15.6 but suspected related with patient's weight.  She was started on Lasix 40 mg IV twice daily.  Vascular Doppler ultrasound of the lower extremities did not reveal any signs of blood clot.  There was improvement of her lower extremity swelling with diuresis.  Patient continued on her spironolactone and  furosemide 20 mg daily added at discharge.  2.  Herniated disks of thoracic spine, strain of left shoulder, back pain, fall at home.  Acute.  Patient reports falling 1 day prior to coming in to the hospital.  X-rays of the shoulder and back revealed no acute dislocation or fx. Mri of the thoracic spine reveal small disc protrusions at T6-7, T9-10 and T10-11.  Patient was initially given IV pain medication, but was later de-escalated down to her home pain medications regimen with symptomatic treatments.  Advised against giving short course of steroids.  Physical therapy evaluated her and patient was set up with Home health with physical therapy at discharge.  3. COPD. Reports using 2L ns as needed at home. No significant wheezing noted. Chest xray with vascular congestion.  Hospital stay she received breathing treatments as needed and was continued on her home regimen at discharge.  4. Diabetes mellitus type 2. Serum glucose 126 on admission.  Home oral m her husband edications were held and patient was placed on sliding scale insulin during hospital stay.  At discharge she was recommended to restart home medication.  5.  Essential hypertension. Fair control.  Continued Toprol, lisinopril, lasix and spironolactone  6.  Morbid obesity: BMI calculated at 51.9 kg/m   Follow UP  Patient advised to follow-up with her primary care  provider office at discharge.  Consults obtained : Physical therapy  Discharge Condition: stable   Diet and Activity recommendation: See Discharge Instructions below   Discharge Instructions    Discharge instructions   Complete by:  As directed    Please call and set up a follow-up appointment with your primary care provider at [facility] within 1 week of being discharged from the hospital.  You were found to have some signs of congestive heart failure and we have started you on low-dose furosemide 20 mg daily.  Please follow-up with Dr. Buddy Duty of  cardiology in outpatient setting and monitor your weight daily.  After your fall at home you were found to have mild herniations herniations of your thoracic spine.  It is recommended that you continue current home medications, and refills have been sent for diclofenac gel and Robaxin to your pharmacy.  You will be set up with home health physical therapy.   Get CBC and BMP-  checked  by Primary MD or SNF MD in 5-7 days ( we routinely change or add medications that can affect your baseline labs and fluid status, therefore we recommend that you get the mentioned basic workup next visit with your PCP, your PCP may decide not to get them or add new tests based on their clinical decision)  Activity: As tolerated with Full fall precautions use walker/cane & assistance as needed  Disposition Home **  Diet: Heart healthy/carb modified with Fluid restriction: 1500 mL Fluid        For Heart failure patients - Check your Weight same time everyday, if you gain over 2 pounds, or you develop in leg swelling, experience more shortness of breath or chest pain, call your Primary doctor immediately. Follow Cardiac Low Salt Diet and 1.5 lit/day fluid restriction.  Special Instructions: If you have smoked or chewed Tobacco  in the last 2 yrs please stop smoking, stop any regular Alcohol  and or any Recreational drug use.  On your next visit with your primary  care physician please Get Medicines reviewed and adjusted.  Please request your [name], MD to go over all Hospital Tests and Procedure/Radiological results at the follow up, please get all Hospital records sent to your Prim MD by signing hospital release before you go home.  If you experience worsening of your admission symptoms, develop shortness of breath, life threatening emergency, suicidal or homicidal thoughts you must seek medical attention immediately by calling 911 or calling your MD immediately  if symptoms less severe.  You Must read complete instructions/literature along with all the possible adverse reactions/side effects for all the Medicines you take and that have been prescribed to you. Take any new Medicines after you have completely understood and accpet all the possible adverse reactions/side effects.   Do not drive, operate heavy machinery, perform activities at heights, swimming or participation in water activities or provide baby sitting services if your were admitted for syncope or siezures until you have seen by Primary MD or a Neurologist and advised to do so again.  Do not drive when taking Pain medications.  Do not take more than prescribed Pain, Sleep and Anxiety Medications  Wear Seat belts while driving.   Please note  You were cared for by a hospitalist during your hospital stay. If you have any questions about your discharge medications or the care you received while you were in the hospital after you are discharged, you can call the unit and asked to speak with the hospitalist on call if the hospitalist that took care of you is not available. Once you are discharged, your primary care physician will handle any further medical issues. Please note that NO REFILLS for any discharge medications will be authorized once you are discharged, as it is imperative that you return to your primary care physician (or establish a relationship with a primary care  physician if you do not have one) for your aftercare needs so that they can reassess your need for medications and monitor your lab values.        Discharge  Medications     Allergies as of 06/19/2018      Reactions   Bee Venom Swelling, Other (See Comments)   "All over my body" (swelling)   Fioricet [butalbital-apap-caffeine] Nausea And Vomiting, Rash   Ibuprofen Rash, Other (See Comments)   Severe rash   Lamisil [terbinafine] Rash, Other (See Comments)   Pt states this causes her to "feel funny"   Nsaids Other (See Comments)   Per MD's orders       Medication List    TAKE these medications   Accu-Chek Aviva Plus w/Device Kit USE AS DIRECTED TO CHECK BLOOD SUGAR   albuterol 108 (90 Base) MCG/ACT inhaler Commonly known as:  PROVENTIL HFA;VENTOLIN HFA Inhale 1-2 puffs into the lungs every 6 (six) hours as needed for wheezing or shortness of breath.   Allergy Relief 10 MG tablet Generic drug:  loratadine Take 10 mg by mouth daily.   allopurinol 100 MG tablet Commonly known as:  ZYLOPRIM Take 100 mg by mouth daily.   Anoro Ellipta 62.5-25 MCG/INH Aepb Generic drug:  umeclidinium-vilanterol Inhale 1 puff into the lungs daily.   butalbital-acetaminophen-caffeine 50-325-40 MG tablet Commonly known as:  FIORICET, ESGIC Take 1-2 tablets by mouth every 4 (four) hours as needed for migraine.   calcitRIOL 0.5 MCG capsule Commonly known as:  ROCALTROL Take 1 capsule (0.5 mcg total) by mouth daily.   carvedilol 6.25 MG tablet Commonly known as:  COREG Take 1 tablet (6.25 mg total) by mouth 2 (two) times daily with a meal.   cetirizine 10 MG tablet Commonly known as:  ZYRTEC Take 10 mg by mouth daily.   clonazePAM 2 MG tablet Commonly known as:  KLONOPIN Take 2 mg by mouth 3 (three) times daily. for anxiety   diclofenac sodium 1 % Gel Commonly known as:  VOLTAREN Apply 2 g topically 4 (four) times daily as needed (for pain).   EPINEPHrine 0.3 mg/0.3 mL Soaj  injection Commonly known as:  EPI-PEN Inject 0.3 mg into the muscle daily as needed (allergic reaction).   FeroSul 325 (65 FE) MG tablet Generic drug:  ferrous sulfate Take 325 mg by mouth daily.   furosemide 20 MG tablet Commonly known as:  LASIX Take 1 tablet (20 mg total) by mouth daily.   glucose blood test strip Use as instructed to check blood sugar up to 4 times daily   HYDROcodone-acetaminophen 5-325 MG tablet Commonly known as:  NORCO/VICODIN Take 1 tablet by mouth every 6 (six) hours as needed for moderate pain.   ipratropium-albuterol 0.5-2.5 (3) MG/3ML Soln Commonly known as:  DUONEB Take 3 mLs by nebulization 4 (four) times daily.   levothyroxine 112 MCG tablet Commonly known as:  SYNTHROID, LEVOTHROID Take 224 mcg by mouth daily.   lisinopril 5 MG tablet Commonly known as:  PRINIVIL,ZESTRIL Take 1 tablet (5 mg total) by mouth daily. Please call for office visit [phone]   loperamide 2 MG capsule Commonly known as:  IMODIUM Take 1 capsule (2 mg total) by mouth 4 (four) times daily as needed for diarrhea or loose stools.   metFORMIN 500 MG tablet Commonly known as:  Glucophage Take 1 tablet (500 mg total) by mouth 2 (two) times daily with a meal.   methocarbamol 500 MG tablet Commonly known as:  ROBAXIN Take 1 tablet (500 mg total) by mouth every 8 (eight) hours as needed for muscle spasms.   metoCLOPramide 10 MG tablet Commonly known as:  REGLAN Take 1 tablet (10 mg total) by mouth  every 6 (six) hours as needed for nausea (or headache).   metoprolol succinate 25 MG 24 hr tablet Commonly known as:  TOPROL-XL Take 1 tablet (25 mg total) by mouth daily.   nitroGLYCERIN 0.4 MG SL tablet Commonly known as:  NITROSTAT Place 1 tablet (0.4 mg total) under the tongue every 5 (five) minutes as needed for chest pain.   nystatin cream Commonly known as:  MYCOSTATIN Apply 1 application topically 2 (two) times daily as needed for dry skin.   omeprazole 40  MG capsule Commonly known as:  PRILOSEC Take 40 mg by mouth daily.   OXYGEN Inhale 2 L into the lungs at bedtime.   potassium chloride SA 20 MEQ tablet Commonly known as:  K-DUR,KLOR-CON Take 2 tablets (40 mEq total) by mouth daily. What changed:  how much to take   pravastatin 40 MG tablet Commonly known as:  PRAVACHOL Take 40 mg by mouth every evening.   QC Stool Softener Pls Laxative 8.6-50 MG tablet Generic drug:  senna-docusate Take 1 tablet by mouth at bedtime.   spironolactone 100 MG tablet Commonly known as:  ALDACTONE Take 100 mg by mouth daily.   tiotropium 18 MCG inhalation capsule Commonly known as:  SPIRIVA Place 18 mcg into inhaler and inhale daily.       Major procedures and Radiology Reports - PLEASE review detailed and final reports for all details, in brief -     Dg Thoracic Spine 2 View  Result Date: 06/17/2018 CLINICAL DATA:  58 y/o F; fall today with upper and lower back pain. EXAM: THORACIC SPINE 2 VIEWS; LUMBAR SPINE - COMPLETE 4+ VIEW COMPARISON:  None. FINDINGS: Thoracic spine: Twelve paired thoracic ribs. Surgical clips project over the lower neck. Multilevel bridging endplate osteophytes. Lumbar spine: Five lumbar vertebral bodies. No pars defect. Normal lumbar lordosis without listhesis. No loss of vertebral body or disc space height. Small multilevel endplate marginal osteophytes. IMPRESSION: 1. No acute fracture or malalignment. 2. Multilevel bridging endplate osteophytes of thoracic spine and mild discogenic degenerative changes of lumbar spine. Electronically Signed   By: Kristine Garbe M.D.   On: 06/17/2018 22:38   Dg Lumbar Spine Complete  Result Date: 06/17/2018 CLINICAL DATA:  58 y/o F; fall today with upper and lower back pain. EXAM: THORACIC SPINE 2 VIEWS; LUMBAR SPINE - COMPLETE 4+ VIEW COMPARISON:  None. FINDINGS: Thoracic spine: Twelve paired thoracic ribs. Surgical clips project over the lower neck. Multilevel bridging  endplate osteophytes. Lumbar spine: Five lumbar vertebral bodies. No pars defect. Normal lumbar lordosis without listhesis. No loss of vertebral body or disc space height. Small multilevel endplate marginal osteophytes. IMPRESSION: 1. No acute fracture or malalignment. 2. Multilevel bridging endplate osteophytes of thoracic spine and mild discogenic degenerative changes of lumbar spine. Electronically Signed   By: Kristine Garbe M.D.   On: 06/17/2018 22:38   Dg Pelvis 1-2 Views  Result Date: 06/17/2018 CLINICAL DATA:  Pelvic pain after fall EXAM: PELVIS - 1-2 VIEW COMPARISON:  None. FINDINGS: No fracture or dislocation of the left hip. There is lucency of the medial aspect of the left inferior pubic ramus. No other osseous abnormality. IMPRESSION: 1. Lucency of the medial aspect of the left inferior pubic ramus is favored to be a composite shadow artifact, though a minimally displaced fracture could also have this appearance. CT of the pelvis would be confirmatory. 2. No fracture or dislocation of the hips. Electronically Signed   By: Ulyses Jarred M.D.   On: 06/17/2018 22:42  Ct Angio Chest Pe W And/or Wo Contrast  Result Date: 06/10/2018 CLINICAL DATA:  Left-sided chest pain EXAM: CT ANGIOGRAPHY CHEST WITH CONTRAST TECHNIQUE: Multidetector CT imaging of the chest was performed using the standard protocol during bolus administration of intravenous contrast. Multiplanar CT image reconstructions and MIPs were obtained to evaluate the vascular anatomy. CONTRAST:  137m OMNIPAQUE IOHEXOL 350 MG/ML SOLN COMPARISON:  Chest x-ray 06/09/2018, CT chest 09/23/2016 FINDINGS: Cardiovascular: Satisfactory opacification of the pulmonary arteries to the segmental level. No evidence of pulmonary embolism. Poor contrast opacification of subsegmental lower lobe branches limits evaluation for PE in these regions. Mild cardiomegaly. No pericardial effusion. Nonaneurysmal aorta. Mild aortic atherosclerosis.  Mediastinum/Nodes: Midline trachea. No thyroid mass. No significant adenopathy. Esophagus within normal limits. Lungs/Pleura: Mild bilateral patchy foci of ground-glass density. No pleural effusion. Partial atelectasis at the lingula and lung bases. Upper Abdomen: No acute abnormality. Musculoskeletal: No chest wall abnormality. No acute or significant osseous findings. Review of the MIP images confirms the above findings. IMPRESSION: 1. Suboptimal opacification of distal branch vessels limits evaluation for subsegmental emboli. No acute central embolus is seen. 2. Mild cardiomegaly 3. Mild bilateral ground-glass densities within the lung fields which could be secondary to mild pulmonary edema or possible respiratory infection. There is partial atelectasis at the lingula and bilateral lower lobes. Aortic Atherosclerosis (ICD10-I70.0). Electronically Signed   By: KDonavan FoilM.D.   On: 06/10/2018 00:20   Ct Pelvis Wo Contrast  Result Date: 06/17/2018 CLINICAL DATA:  Fall with pelvis trauma EXAM: CT PELVIS WITHOUT CONTRAST TECHNIQUE: Multidetector CT imaging of the pelvis was performed following the standard protocol without intravenous contrast. COMPARISON:  06/17/2018 radiograph, CT 06/06/2017 FINDINGS: Urinary Tract:  No abnormality visualized. Bowel:  Unremarkable visualized pelvic bowel loops. Vascular/Lymphatic: Nonaneurysmal.  No significant pelvic adenopathy Reproductive: Status post hysterectomy. No suspicious adnexal mass. Other:  Negative for pelvic effusion Musculoskeletal: No acute or suspicious abnormality IMPRESSION: No CT evidence for acute intrapelvic abnormality. Electronically Signed   By: KDonavan FoilM.D.   On: 06/17/2018 23:48   Mr Thoracic Spine Wo Contrast  Result Date: 06/18/2018 CLINICAL DATA:  [place]. Mid back pain since that time. EXAM: MRI THORACIC SPINE WITHOUT CONTRAST TECHNIQUE: Multiplanar, multisequence MR imaging of the thoracic spine was performed.  No intravenous contrast was administered. COMPARISON:  Chest CT 06/09/2018 FINDINGS: Alignment:  Normal Vertebrae: No acute fracture. No worrisome bone lesions. Small scattered hemangiomas are noted. Cord: Normal appearance of the thoracic spinal cord. No cord lesions or syrinx. Paraspinal and other soft tissues: No significant findings. Disc levels: Small focal right paracentral disc protrusion at T6-7 with mild impression on the right side of the thecal sac. Small to moderate-sized focal central disc protrusion at T9-10 with mild mass effect on the ventral thecal sac. Small right paracentral disc protrusion at T10-11 with mild mass effect on the thecal sac. No foraminal lesions/foraminal stenosis. IMPRESSION: 1. No acute bony findings. 2. Small thoracic disc protrusions noted at T6-7, T9-10 and T10-11 as detailed above. 3. Normal appearance of the thoracic spinal cord. Electronically Signed   By: PMarijo SanesM.D.   On: 06/18/2018 11:19   Ct Shoulder Left Wo Contrast  Result Date: 06/17/2018 CLINICAL DATA:  58year old female status post fall onto left shoulder last night. Pain. EXAM: CT OF THE UPPER LEFT EXTREMITY WITHOUT CONTRAST TECHNIQUE: Multidetector CT imaging of the upper left extremity was performed according to the standard protocol. COMPARISON:  Left shoulder radiographs today.  CTA chest 06/09/2018. FINDINGS: No glenohumeral dislocation. Intact proximal left humerus and visible humerus shaft. Intact left scapula. Intact left clavicle. Normal acromioclavicular joint alignment. Degenerative changes at the left [facility] joint with small subchondral cysts and degenerative spurring. Soft tissue surrounding the left shoulder are within normal limits aside from a benign appearing intramuscular lipoma of the left pectoralis on series 3, image 27. No left axillary lymphadenopathy. Visible left ribs and spine appear stable. Sequelae of thyroidectomy. Stable visible mediastinum. Stable visible left lung parenchyma.  IMPRESSION: No acute traumatic injury identified about the left shoulder. Electronically Signed   By: Genevie Ann M.D.   On: 06/17/2018 22:41   Dg Chest Portable 1 View  Result Date: 06/17/2018 CLINICAL DATA:  Dyspnea EXAM: PORTABLE CHEST 1 VIEW COMPARISON:  06/09/2018, 04/23/2018, CT 06/09/2018 FINDINGS: Cardiomegaly with enlarged pulmonary arteries and vascular congestion. Similar appearance of streaky bibasilar scarring. Slight increased hazy opacity at the bases. No pneumothorax. Postsurgical changes at the thoracic inlet. No pneumothorax. IMPRESSION: 1. Cardiomegaly with vascular congestion. Enlarged appearing central pulmonary vessels, possible hypertension 2. Chronic linear atelectasis or scarring at the bases. Slight increased hazy opacity may reflect superimposed edema or mild infiltrates. Electronically Signed   By: Donavan Foil M.D.   On: 06/17/2018 23:52   Dg Chest Port 1 View  Result Date: 06/09/2018 CLINICAL DATA:  Cough and chest pain. Shortness of breath. EXAM: PORTABLE CHEST 1 VIEW COMPARISON:  Radiographs 04/23/2018. additional prior radiographs reviewed. FINDINGS: Unchanged cardiomegaly mediastinal contours. Improved pulmonary edema from prior exam. Streaky bibasilar scarring again seen. No acute consolidative opacity. No large pleural effusion. No pneumothorax. Detailed evaluation limited by soft tissue attenuation from habitus and portable technique. Surgical clips at the thoracic inlet. IMPRESSION: Stable cardiomegaly and bibasilar scarring. Improved pulmonary edema from exam 6 weeks ago. Electronically Signed   By: Keith Rake M.D.   On: 06/09/2018 22:08   Dg Shoulder Left  Result Date: 06/17/2018 CLINICAL DATA:  Pain after fall last night EXAM: LEFT SHOULDER - 2+ VIEW COMPARISON:  None. FINDINGS: Mild AC joint degenerative changes.  No fracture or dislocation. IMPRESSION: Negative. Electronically Signed   By: Dorise Bullion III M.D   On: 06/17/2018 14:31   Vas [place] Lower Extremity  Venous (dvt)  Result Date: 06/18/2018  Lower Venous Study Indications: Edema, and Pain.  Risk Factors: Cancer Thyroid cancer obesity. Limitations: Body habitus and patient is very sensitive for compression. Comparison Study: No prior study available Performing Technologist: Rudell Cobb  Examination Guidelines: A complete evaluation includes B-mode imaging, spectral Doppler, color Doppler, and power Doppler as needed of all accessible portions of each vessel. Bilateral testing is considered an integral part of a complete examination. Limited examinations for reoccurring indications may be performed as noted.  Right Venous Findings: +---------+---------------+---------+-----------+----------+-------------------+            Compressibility Phasicity Spontaneity Properties Summary              +---------+---------------+---------+-----------+----------+-------------------+  CFV                       Yes       Yes                    not well visualized  with compression     +---------+---------------+---------+-----------+----------+-------------------+  SFJ       Full                                                                  +---------+---------------+---------+-----------+----------+-------------------+  FV Prox   Full                                                                  +---------+---------------+---------+-----------+----------+-------------------+  FV Mid    Full                                                                  +---------+---------------+---------+-----------+----------+-------------------+  FV Distal                 Yes       Yes                    not well visualized                                                              with compression     +---------+---------------+---------+-----------+----------+-------------------+  PFV                                                        Not visualized        +---------+---------------+---------+-----------+----------+-------------------+  POP       Full            Yes       Yes                                         +---------+---------------+---------+-----------+----------+-------------------+  PTV       Full                                             not all segment  visualized with                                                                  compression          +---------+---------------+---------+-----------+----------+-------------------+  PERO      Full                                             not all segment                                                                  visualized with                                                                  compression          +---------+---------------+---------+-----------+----------+-------------------+  Left Venous Findings: +---------+---------------+---------+-----------+----------+-------------------+            Compressibility Phasicity Spontaneity Properties Summary              +---------+---------------+---------+-----------+----------+-------------------+  CFV       Full            Yes       Yes                                         +---------+---------------+---------+-----------+----------+-------------------+  SFJ       Full                                                                  +---------+---------------+---------+-----------+----------+-------------------+  FV Prox   Full                                                                  +---------+---------------+---------+-----------+----------+-------------------+  FV Mid    Full                                                                  +---------+---------------+---------+-----------+----------+-------------------+  FV Distal                 Yes       Yes                    not well visualized                                                               with compression     +---------+---------------+---------+-----------+----------+-------------------+  PFV                                                        Not visualized       +---------+---------------+---------+-----------+----------+-------------------+  POP       Full            Yes       Yes                                         +---------+---------------+---------+-----------+----------+-------------------+  PTV                                                        not well visualized                                                              with compression     +---------+---------------+---------+-----------+----------+-------------------+  PERO                                                       not well visualized                                                              with compression     +---------+---------------+---------+-----------+----------+-------------------+    Summary: Right: There is no evidence of deep vein thrombosis in the lower extremity. However, portions of this examination were limited- see technologist comments above. No cystic structure found in the popliteal fossa. Left: There is no evidence of deep vein thrombosis in the lower extremity. However, portions of this examination were limited- see technologist comments above. No cystic structure found in the popliteal fossa.  *See table(s) above for measurements and observations. Electronically signed by Deitra Mayo MD on 06/18/2018 at 4:28:35 PM.    Final  Micro Results    No results found for this or any previous visit (from the past 240 hour(s)).     Today   Subjective    Donise Woodle today states that she still has a lot of pain in her back with movement.   Objective   Blood pressure 110/60, pulse 95, temperature 98.4 F (36.9 C), temperature source Oral, resp. rate 16, SpO2 94 %.   Intake/Output Summary (Last 24 hours) at 06/19/2018 1503 Last data filed at 06/19/2018 1254 Gross  per 24 hour  Intake 360 ml  Output 3000 ml  Net -2640 ml    Exam  Constitutional: Obese female in NAD, calm, comfortable Eyes: PERRL, lids and conjunctivae normal ENMT: Mucous membranes are moist. Posterior pharynx clear of any exudate or lesions.  Neck: normal, supple, no masses, no thyromegaly.  No JVD. Respiratory: Decreased overall aeration, no wheezing, no crackles. Normal respiratory effort. No accessory muscle use.  Cardiovascular: Regular rate and rhythm, no murmurs / rubs / gallops. No extremity edema. 2+ pedal pulses. No carotid bruits.  Abdomen: no tenderness, no masses palpated. No hepatosplenomegaly. Bowel sounds positive.  Musculoskeletal: no clubbing / cyanosis. No joint deformity upper and lower extremities.  Decreased range of motion of the spine. Skin: no rashes, lesions, ulcers. No induration Neurologic: CN 2-12 grossly intact. Sensation intact, DTR normal. Strength 5/5 in all 4.  Psychiatric: Normal judgment and insight. Alert and oriented x 3. Normal mood.    Data Review   CBC w Diff:  Lab Results  Component Value Date   WBC 12.4 (H) 06/18/2018   HGB 11.5 (L) 06/18/2018   HCT 38.5 06/18/2018   PLT 238 06/18/2018   LYMPHOPCT 19 06/18/2018   MONOPCT 6 06/18/2018   EOSPCT 2 06/18/2018   BASOPCT 0 06/18/2018    CMP:  Lab Results  Component Value Date   NA 138 06/18/2018   K 3.9 06/18/2018   CL 99 06/18/2018   CO2 31 06/18/2018   BUN 5 (L) 06/18/2018   CREATININE 0.52 06/18/2018   CREATININE 0.62 06/02/2015   PROT 6.5 06/18/2018   ALBUMIN 3.5 06/18/2018   BILITOT 0.5 06/18/2018   ALKPHOS 59 06/18/2018   AST 17 06/18/2018   ALT 17 06/18/2018  .   Total Time in preparing paper work, data evaluation and todays exam - 35 minutes  [name] M.D on 06/19/2018 at 3:03 PM  [facility]   Office  [phone]

## 2018-06-23 DIAGNOSIS — Y92009 Unspecified place in unspecified non-institutional (private) residence as the place of occurrence of the external cause: Secondary | ICD-10-CM

## 2018-06-23 DIAGNOSIS — W19XXXA Unspecified fall, initial encounter: Secondary | ICD-10-CM

## 2018-06-23 DIAGNOSIS — M5124 Other intervertebral disc displacement, thoracic region: Secondary | ICD-10-CM | POA: Diagnosis present

## 2018-06-27 ENCOUNTER — Emergency Department (HOSPITAL_COMMUNITY)
Admission: EM | Admit: 2018-06-27 | Discharge: 2018-06-27 | Disposition: A | Discharge status: Home / Self Care | Payer: Medicaid Other | Attending: Emergency Medicine | Admitting: Emergency Medicine

## 2018-06-27 ENCOUNTER — Emergency Department (HOSPITAL_COMMUNITY): Payer: Medicaid Other

## 2018-06-27 ENCOUNTER — Encounter (HOSPITAL_COMMUNITY): Payer: Self-pay | Admitting: Emergency Medicine

## 2018-06-27 ENCOUNTER — Other Ambulatory Visit: Payer: Self-pay

## 2018-06-27 DIAGNOSIS — E119 Type 2 diabetes mellitus without complications: Secondary | ICD-10-CM | POA: Diagnosis not present

## 2018-06-27 DIAGNOSIS — J449 Chronic obstructive pulmonary disease, unspecified: Secondary | ICD-10-CM | POA: Insufficient documentation

## 2018-06-27 DIAGNOSIS — Z79899 Other long term (current) drug therapy: Secondary | ICD-10-CM | POA: Diagnosis not present

## 2018-06-27 DIAGNOSIS — W19XXXA Unspecified fall, initial encounter: Secondary | ICD-10-CM

## 2018-06-27 DIAGNOSIS — E890 Postprocedural hypothyroidism: Secondary | ICD-10-CM | POA: Diagnosis not present

## 2018-06-27 DIAGNOSIS — I5042 Chronic combined systolic (congestive) and diastolic (congestive) heart failure: Secondary | ICD-10-CM | POA: Diagnosis not present

## 2018-06-27 DIAGNOSIS — M25551 Pain in right hip: Secondary | ICD-10-CM | POA: Insufficient documentation

## 2018-06-27 DIAGNOSIS — M25561 Pain in right knee: Secondary | ICD-10-CM | POA: Insufficient documentation

## 2018-06-27 DIAGNOSIS — Z7984 Long term (current) use of oral hypoglycemic drugs: Secondary | ICD-10-CM | POA: Diagnosis not present

## 2018-06-27 DIAGNOSIS — I110 Hypertensive heart disease with heart failure: Secondary | ICD-10-CM | POA: Insufficient documentation

## 2018-06-27 DIAGNOSIS — Z87891 Personal history of nicotine dependence: Secondary | ICD-10-CM | POA: Diagnosis not present

## 2018-06-27 MED ORDER — HYDROCODONE-ACETAMINOPHEN 5-325 MG PO TABS
1.0000 | ORAL_TABLET | Freq: Once | ORAL | Status: AC
  Administered 2018-06-27: 1 via ORAL
  Filled 2018-06-27: qty 1

## 2018-06-27 MED ORDER — LIDOCAINE-EPINEPHRINE-TETRACAINE (LET) SOLUTION: 3.0000 mL | Freq: Once | NASAL | Status: DC

## 2018-06-27 MED ORDER — CLONAZEPAM 0.5 MG PO TABS
2.0000 mg | ORAL_TABLET | Freq: Once | ORAL | Status: AC
  Administered 2018-06-27: 2 mg via ORAL
  Filled 2018-06-27: qty 4

## 2018-06-27 NOTE — ED Provider Notes (Signed)
[facility] EMERGENCY DEPARTMENT Provider Note   CSN: [id number] Arrival date & time: 06/27/18  1543    History   Chief Complaint Chief Complaint  Patient presents with  . Fall    HPI Michele Owens is a 58 y.o. female.     HPI   Patient is a 58 year old female with a history of anxiety, COPD on 2 L of oxygen, heart failure with preserved ejection fraction, type 2 diabetes mellitus presenting for fall.  Patient was using a community bus to go to the grocery store was in the process of stepping off the bus when she lost her footing.  She denies any syncopal or presyncopal sensation prior to this occurring.  She reports that the bus driver tried to assist her in stepping off the bus, however she tripped on their foot.  Patient reports that she fell backwards onto her right hip and knee.  She denies hitting her head or loss of consciousness.  She takes no blood thinning medications.  She denies no weakness or numbness in lower extremities, saddle anesthesia, or loss of bowel or bladder control.  She is complaining primarily of pain in her right hip.  Past Medical History:  Diagnosis Date  . Anxiety   . Arthritis   . Asthma   . Chronic diastolic CHF (congestive heart failure) ([place])   . COPD (chronic obstructive pulmonary disease) (HCC)    Uses Oxygen at night  . Depression   . Diabetes mellitus without complication ([place])   . Dyspnea    occ  . Family history of thyroid cancer   . GERD (gastroesophageal reflux disease)   . Gout   . Headache    migraines  . History of DVT of lower extremity   . History of nuclear stress test    Myoview 2/17:  Low risk stress nuclear study with a small, moderate intensity, partially reversible inferior lateral defect consistent with small prior infarct and minimal peri-infarct ischemia; EF 68 with normal wall motion  . Hypertension   . Pneumonia   . Thyroid cancer ([place]) 09/23/2016    Patient Active Problem List   Diagnosis  Date Noted  . Fall at home, initial encounter 06/23/2018  . HNP (herniated nucleus pulposus), thoracic 06/23/2018  . Sprain of shoulder, left 06/18/2018  . Strain of thoracic back region 06/18/2018  . Strain of lumbar region 06/18/2018  . CHF (congestive heart failure) ([place]) 06/18/2018  . Pain 06/17/2018  . Hypertensive disorder 08/28/2017  . Obesity 08/28/2017  . COPD exacerbation ([place]) 08/04/2017  . Acute and chronic respiratory failure with hypercapnia ([place])   . Acute on chronic diastolic heart failure ([place])   . Pulmonary HTN ([place])   . Genetic testing 02/27/2017  . Family history of thyroid cancer   . Hypomagnesemia 10/03/2016  . Boil, breast 10/03/2016  . Atypical chest pain   . HLD (hyperlipidemia) 09/23/2016  . COPD (chronic obstructive pulmonary disease) ([place]) 09/23/2016  . Gout 09/23/2016  . DVT (deep vein thrombosis) in pregnancy 09/23/2016  . Thyroid cancer ([place]) 09/23/2016  . Hypocalcemia 09/23/2016  . AKI (acute kidney injury) ([place]) 09/23/2016  . 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 Acute pulmonary edema (HCC)   . Acute hypoxemic respiratory failure ([place])   . Acute on chronic respiratory failure with hypoxemia ([place])   . Ventilator dependent ([place])   . Respiratory failure ([place]) 07/16/2016  . Chronic combined systolic (congestive) and diastolic (congestive) heart failure ([place]) 02/10/2016  .  Dyspnea   . CAP (community acquired pneumonia) 01/23/2016  . 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 Pulmonary hypertension ([place]) 01/23/2016  . Sepsis ([place]) 01/23/2016  . Obesity hypoventilation syndrome ([place]) 08/26/2015  . Chest pain 06/16/2015  . Dyslipidemia associated with type 2 diabetes mellitus ([place]) 04/24/2015  . Leukocytosis 04/24/2015  . Controlled type 2 diabetes mellitus without complication, without long-term current use of insulin ([place]) 04/24/2015  . Anxiety 04/24/2015  . Benign essential HTN 04/24/2015  .  Normocytic anemia 04/24/2015  . Asthma with COPD with exacerbation ([place]) 04/24/2015  . OSA (obstructive sleep apnea) 05/10/2012  . Tobacco abuse 07/04/2011    Past Surgical History:  Procedure Laterality Date  . BUNIONECTOMY Bilateral   . CARDIAC CATHETERIZATION N/A 06/23/2015   Procedure: Right/Left Heart Cath and Coronary Angiography;  Surgeon: [name], MD;  Location: [facility];  Service: Cardiovascular;  Laterality: N/A;  . COLONOSCOPY WITH PROPOFOL N/A 12/15/2015   Procedure: COLONOSCOPY WITH PROPOFOL;  Surgeon: [name], MD;  Location: [facility];  Service: Endoscopy;  Laterality: N/A;  . THYROIDECTOMY  09/19/2016  . THYROIDECTOMY N/A 09/19/2016   Procedure: TOTAL THYROIDECTOMY;  Surgeon: [name], MD;  Location: [facility];  Service: General;  Laterality: N/A;  . TONSILLECTOMY    . TOTAL ABDOMINAL HYSTERECTOMY  07/14/10     OB History   No obstetric history on file.      Home Medications    Prior to Admission medications   Medication Sig Start Date End Date Taking? Authorizing Provider  albuterol (PROVENTIL HFA;VENTOLIN HFA) 108 (90 Base) MCG/ACT inhaler Inhale 1-2 puffs into the lungs every 6 (six) hours as needed for wheezing or shortness of breath. 04/23/18   Long, [name], MD  ALLERGY RELIEF 10 MG tablet Take 10 mg by mouth daily. 04/11/18   [provider]  allopurinol (ZYLOPRIM) 100 MG tablet Take 100 mg by mouth daily.    [provider]  ANORO ELLIPTA 62.5-25 MCG/INH AEPB Inhale 1 puff into the lungs daily.  04/11/18   [provider]  Blood Glucose Monitoring Suppl (ACCU-CHEK AVIVA PLUS) w/Device KIT USE AS DIRECTED TO CHECK BLOOD SUGAR 08/07/17   [provider]  butalbital-acetaminophen-caffeine (FIORICET, ESGIC) 50-325-40 MG tablet Take 1-2 tablets by mouth every 4 (four) hours as needed for migraine. 06/13/18   [provider]  calcitRIOL (ROCALTROL) 0.5 MCG capsule Take 1 capsule (0.5 mcg total) by mouth  daily. 09/26/16   Hongalgi, [name], MD  carvedilol (COREG) 6.25 MG tablet Take 1 tablet (6.25 mg total) by mouth 2 (two) times daily with a meal. 01/23/18   Bensimhon, [name], MD  cetirizine (ZYRTEC) 10 MG tablet Take 10 mg by mouth daily. 07/10/17   [provider]  clonazePAM (KLONOPIN) 2 MG tablet Take 2 mg by mouth 3 (three) times daily. for anxiety 05/08/17   [provider]  diclofenac sodium (VOLTAREN) 1 % GEL Apply 2 g topically 4 (four) times daily as needed (for pain). 06/19/18   [name], MD  EPINEPHrine 0.3 mg/0.3 mL IJ SOAJ injection Inject 0.3 mg into the muscle daily as needed (allergic reaction).    [provider]  FEROSUL 325 (65 Fe) MG tablet Take 325 mg by mouth daily. 03/22/17   [provider]  furosemide (LASIX) 20 MG tablet Take 1 tablet (20 mg total) by mouth daily. 06/19/18   [name], MD  glucose blood test strip Use as instructed to  check blood sugar up to 4 times daily 08/06/17   [name], DO  HYDROcodone-acetaminophen (NORCO/VICODIN) 5-325 MG tablet Take 1 tablet by mouth every 6 (six) hours as needed for moderate pain.  02/12/18   [provider]  ipratropium-albuterol (DUONEB) 0.5-2.5 (3) MG/3ML SOLN Take 3 mLs by nebulization 4 (four) times daily.  04/05/17   [provider]  levothyroxine (SYNTHROID, LEVOTHROID) 112 MCG tablet Take 224 mcg by mouth daily. 07/11/17   [provider]  lisinopril (PRINIVIL,ZESTRIL) 5 MG tablet Take 1 tablet (5 mg total) by mouth daily. Please call for office visit [phone] 08/15/17   [name], MD  loperamide (IMODIUM) 2 MG capsule Take 1 capsule (2 mg total) by mouth 4 (four) times daily as needed for diarrhea or loose stools. 06/04/47   [name], MD  metFORMIN (GLUCOPHAGE) 500 MG tablet Take 1 tablet (500 mg total) by mouth 2 (two) times daily with a meal. 12/11/14   [name], MD  methocarbamol (ROBAXIN) 500 MG tablet Take 1 tablet (500  mg total) by mouth every 8 (eight) hours as needed for muscle spasms. 06/19/18   [name], MD  metoCLOPramide (REGLAN) 10 MG tablet Take 1 tablet (10 mg total) by mouth every 6 (six) hours as needed for nausea (or headache). 7/53/00   [name], MD  metoprolol succinate (TOPROL-XL) 25 MG 24 hr tablet Take 1 tablet (25 mg total) by mouth daily. 08/07/17   [name], DO  nitroGLYCERIN (NITROSTAT) 0.4 MG SL tablet Place 1 tablet (0.4 mg total) under the tongue every 5 (five) minutes as needed for chest pain. 06/19/18   [name], MD  nystatin cream (MYCOSTATIN) Apply 1 application topically 2 (two) times daily as needed for dry skin.  04/13/17   [provider]  omeprazole (PRILOSEC) 40 MG capsule Take 40 mg by mouth daily. 04/13/15   [provider]  OXYGEN Inhale 2 L into the lungs at bedtime.    [provider]  potassium chloride SA (K-DUR,KLOR-CON) 20 MEQ tablet Take 2 tablets (40 mEq total) by mouth daily. Patient taking differently: Take 20 mEq by mouth daily.  08/01/16   [name], MD  pravastatin (PRAVACHOL) 40 MG tablet Take 40 mg by mouth every evening.  03/29/15   [provider]  QC STOOL SOFTENER PLS LAXATIVE 8.6-50 MG tablet Take 1 tablet by mouth at bedtime. 04/11/17   [provider]  spironolactone (ALDACTONE) 100 MG tablet Take 100 mg by mouth daily. 10/02/17   [provider]  tiotropium (SPIRIVA) 18 MCG inhalation capsule Place 18 mcg into inhaler and inhale daily.    [provider]    Family History Family History  Problem Relation Age of Onset  . Cancer Father        thought to be due to exposure to concrete  . Diabetes Mother   . Thyroid cancer Mother        dx in her 70s-60s  . Cancer Maternal Uncle        2 uncles with cancer NOS  . Brain cancer Paternal Aunt   . Cancer Cousin        maternal first cousin - NOS  . Cancer Cousin        maternal first cousin - NOS    Social History  Social History   Tobacco Use  . Smoking status: Former Smoker    Packs/day: 0.50    Years: 41.00  Pack years: 20.50    Types: Cigarettes    Last attempt to quit: 07/2016    Years since quitting: 1.9  . Smokeless tobacco: Never Used  . Tobacco comment: Starting to Wean Off -- using Nicotine Patch  Substance Use Topics  . Alcohol use: Not Currently    Alcohol/week: 0.0 standard drinks    Comment: ? none now  . Drug use: Never    Comment: none for long time     Allergies   Bee venom; Fioricet [butalbital-apap-caffeine]; Ibuprofen; Lamisil [terbinafine]; and Nsaids   Review of Systems Review of Systems  Respiratory: Negative for shortness of breath.   Cardiovascular: Negative for chest pain.  Gastrointestinal: Negative for nausea and vomiting.  Musculoskeletal: Positive for arthralgias and myalgias.  Neurological: Negative for syncope, weakness and numbness.  All other systems reviewed and are negative.    Physical Exam Updated Vital Signs BP 116/75 (BP Location: Right Arm)   Pulse 85   Temp 98.7 F (37.1 C) (Oral)   Resp 20   Ht _0  (1.651 m)   Wt (!) 141.5 kg   SpO2 95%   BMI 51.92 kg/m   Physical Exam Vitals signs and nursing note reviewed.  Constitutional:      General: She is not in acute distress.    Appearance: She is well-developed. She is obese.  HENT:     Head: Normocephalic and atraumatic.  Eyes:     Conjunctiva/sclera: Conjunctivae normal.     Pupils: Pupils are equal, round, and reactive to light.  Neck:     Musculoskeletal: Normal range of motion and neck supple.  Cardiovascular:     Rate and Rhythm: Normal rate and regular rhythm.     Heart sounds: S1 normal and S2 normal. No murmur.  Pulmonary:     Effort: Pulmonary effort is normal.     Breath sounds: Normal breath sounds. No wheezing or rales.  Abdominal:     General: There is no distension.     Palpations: Abdomen is soft.     Tenderness: There is no abdominal tenderness. There  is no guarding.     Comments: No ecchymosis of flank or abdomen.  Musculoskeletal: Normal range of motion.        General: No deformity.     Comments: No midline tenderness to cervical, thoracic, or lumbar spine.  Patient has mild paraspinal muscular tenderness of right-sided lumbar spine. Patient's right hip and bilateral iliac region are without ecchymosis or abrasion.  She is able to logroll the right hip, and is able to perform active flexion, extension, abduction and adduction of the right hip.  Bilateral pulses intact distally. Right knee with tenderness to palpation of superomedial knee. Full ROM. No joint line tenderness. No joint effusion or swelling appreciated. No abnormal alignment or patellar mobility. No bruising, erythema or warmth overlaying the joint. No varus/valgus laxity. Negative drawer's, Lachman's and McMurray's.  No crepitus.  2+ DP pulses bilaterally. All compartments are soft. Sensation intact distal to injury.   Lymphadenopathy:     Cervical: No cervical adenopathy.  Skin:    General: Skin is warm and dry.     Findings: No erythema or rash.  Neurological:     Mental Status: She is alert.     Comments: Cranial nerves grossly intact. Patient moves extremities symmetrically and with good coordination. Normal and symmetric gait with cane.  Psychiatric:        Behavior: Behavior normal.  Thought Content: Thought content normal.        Judgment: Judgment normal.      ED Treatments / Results  Labs (all labs ordered are listed, but only abnormal results are displayed) Labs Reviewed - No data to display  EKG None  Radiology Dg Knee Complete 4 Views Right  Result Date: 06/27/2018 CLINICAL DATA:  58 year old female status post fall from car onto right lower extremity. Pain. EXAM: RIGHT KNEE - COMPLETE 4+ VIEW COMPARISON:  None. FINDINGS: No evidence of fracture, dislocation, or joint effusion. Bone mineralization is within normal limits for age. Mild  tricompartmental degenerative spurring. Joint spaces are preserved. No discrete soft tissue injury. IMPRESSION: No acute fracture or dislocation identified about the right knee. Electronically Signed   By: Genevie Ann M.D.   On: 06/27/2018 16:50   Dg Hip Unilat W Or Wo Pelvis 2-3 Views Right  Result Date: 06/27/2018 CLINICAL DATA:  Fall out of car with right hip pain. Initial encounter. EXAM: DG HIP (WITH OR WITHOUT PELVIS) 2-3V RIGHT COMPARISON:  None. FINDINGS: No acute fracture or dislocation identified. The bony pelvis appears intact. Mild osteoarthritis of both hip joints. IMPRESSION: No acute fracture identified. Electronically Signed   By: Aletta Edouard M.D.   On: 06/27/2018 16:49    Procedures Procedures (including critical care time)  Medications Ordered in ED Medications  HYDROcodone-acetaminophen (NORCO/VICODIN) 5-325 MG per tablet 1 tablet (has no administration in time range)     Initial Impression / Assessment and Plan / ED Course  I have reviewed the triage vital signs and the nursing notes.  Pertinent labs & imaging results that were available during my care of the patient were reviewed by me and considered in my medical decision making (see chart for details).        Patient is well-appearing and neurovascularly intact in bilateral lower extremities.  Patient presenting status post mechanical fall while stepping off of a bus.  This was a ground-level fall.  There is no head trauma.  She does not take blood thinners.  There is no presyncopal or syncopal event.  Here in the emergency department, patient has normal radiographs without evidence of fracture in the hip or pelvis.  New fractures identified a radiograph of the right knee.  Patient is able to ambulate with a cane.  She is on oxygen most of the time, and does require this for transport home.  Therefore, recommend PTR for transport.  She has an upcoming appoint with primary care tomorrow.  She was also given orthopedic  resources and instructed return for any worsening pain, inability to walk, or weakness or numbness in lower extremities.  Patient is in understanding and agrees with the plan of care.  Final Clinical Impressions(s) / ED Diagnoses   Final diagnoses:  Fall, initial encounter  Right hip pain  Acute pain of right knee    ED Discharge Orders    None       [name] 06/27/18 1816    Nat Christen, MD 06/27/18 1845

## 2018-06-27 NOTE — Discharge Instructions (Addendum)
Please see the information and instructions below regarding your visit.  Your diagnoses today include:  1. Fall, initial encounter   2. Right hip pain   3. Acute pain of right knee     Tests performed today include: See side panel of your discharge paperwork for testing performed today. Vital signs are listed at the bottom of these instructions.   Medications prescribed:    Take any prescribed medications only as prescribed, and any over the counter medications only as directed on the packaging.  Home care instructions:  Please follow any educational materials contained in this packet.   Follow-up instructions: Please follow-up with your primary care provider in one day for further evaluation of your symptoms if they are not completely improved.   Return instructions:  Please return to the Emergency Department if you experience worsening symptoms. Please return to the emergency department if you develop any weakness or numbness in your extremities, worsening pain, or inability to walk. Please return if you have any other emergent concerns.  Additional Information:   Your vital signs today were: BP 116/75 (BP Location: Right Arm)    Pulse 85    Temp 98.7 F (37.1 C) (Oral)    Resp 20    Ht 5\' 5"  (1.651 m)    Wt (!) 141.5 kg    SpO2 95%    BMI 51.92 kg/m  If your blood pressure (BP) was elevated on multiple readings during this visit above 130 for the top number or above 80 for the bottom number, please have this repeated by your primary care provider within one month. --------------  Thank you for allowing [place] to participate in your care today.

## 2018-06-27 NOTE — ED Notes (Signed)
..  Patient verbalizes understanding of discharge instructions. Opportunity for questioning and answers were provided. Armband removed by staff, pt discharged from ED via [place].

## 2018-06-27 NOTE — ED Notes (Signed)
PTAR called@[id number] 

## 2018-06-27 NOTE — ED Triage Notes (Signed)
Per EMS pt was exiting a transport vehicle and while taking the last step off she lost her balance. Pt fell backwards onto her butt. Per EMS they report a witness caught her with their leg and she did not hit her head. Pt denies hitting her head nor LOC. Pt c/o pain to her back and right hip.

## 2018-07-04 ENCOUNTER — Inpatient Hospital Stay (HOSPITAL_COMMUNITY)
Admission: EM | Admit: 2018-07-04 | Discharge: 2018-07-11 | DRG: 291 | Disposition: A | Discharge status: Home Health | Payer: Medicaid Other | Attending: Internal Medicine | Admitting: Internal Medicine

## 2018-07-04 ENCOUNTER — Emergency Department (HOSPITAL_COMMUNITY): Payer: Medicaid Other

## 2018-07-04 ENCOUNTER — Other Ambulatory Visit: Payer: Self-pay

## 2018-07-04 ENCOUNTER — Encounter (HOSPITAL_COMMUNITY): Payer: Self-pay | Admitting: Emergency Medicine

## 2018-07-04 DIAGNOSIS — E785 Hyperlipidemia, unspecified: Secondary | ICD-10-CM | POA: Diagnosis present

## 2018-07-04 DIAGNOSIS — Z90710 Acquired absence of both cervix and uterus: Secondary | ICD-10-CM

## 2018-07-04 DIAGNOSIS — E662 Morbid (severe) obesity with alveolar hypoventilation: Secondary | ICD-10-CM | POA: Diagnosis present

## 2018-07-04 DIAGNOSIS — Z85850 Personal history of malignant neoplasm of thyroid: Secondary | ICD-10-CM | POA: Diagnosis not present

## 2018-07-04 DIAGNOSIS — F419 Anxiety disorder, unspecified: Secondary | ICD-10-CM | POA: Diagnosis present

## 2018-07-04 DIAGNOSIS — F329 Major depressive disorder, single episode, unspecified: Secondary | ICD-10-CM | POA: Diagnosis present

## 2018-07-04 DIAGNOSIS — I5033 Acute on chronic diastolic (congestive) heart failure: Secondary | ICD-10-CM

## 2018-07-04 DIAGNOSIS — R609 Edema, unspecified: Secondary | ICD-10-CM

## 2018-07-04 DIAGNOSIS — O2230 Deep phlebothrombosis in pregnancy, unspecified trimester: Secondary | ICD-10-CM | POA: Diagnosis present

## 2018-07-04 DIAGNOSIS — Z87891 Personal history of nicotine dependence: Secondary | ICD-10-CM | POA: Diagnosis not present

## 2018-07-04 DIAGNOSIS — M160 Bilateral primary osteoarthritis of hip: Secondary | ICD-10-CM | POA: Diagnosis present

## 2018-07-04 DIAGNOSIS — R0602 Shortness of breath: Secondary | ICD-10-CM

## 2018-07-04 DIAGNOSIS — I110 Hypertensive heart disease with heart failure: Secondary | ICD-10-CM | POA: Diagnosis present

## 2018-07-04 DIAGNOSIS — E890 Postprocedural hypothyroidism: Secondary | ICD-10-CM | POA: Diagnosis present

## 2018-07-04 DIAGNOSIS — I5031 Acute diastolic (congestive) heart failure: Secondary | ICD-10-CM | POA: Diagnosis present

## 2018-07-04 DIAGNOSIS — K219 Gastro-esophageal reflux disease without esophagitis: Secondary | ICD-10-CM | POA: Diagnosis present

## 2018-07-04 DIAGNOSIS — J441 Chronic obstructive pulmonary disease with (acute) exacerbation: Secondary | ICD-10-CM | POA: Diagnosis present

## 2018-07-04 DIAGNOSIS — J9622 Acute and chronic respiratory failure with hypercapnia: Secondary | ICD-10-CM | POA: Diagnosis present

## 2018-07-04 DIAGNOSIS — M109 Gout, unspecified: Secondary | ICD-10-CM | POA: Diagnosis present

## 2018-07-04 DIAGNOSIS — M5124 Other intervertebral disc displacement, thoracic region: Secondary | ICD-10-CM | POA: Diagnosis present

## 2018-07-04 DIAGNOSIS — Y92009 Unspecified place in unspecified non-institutional (private) residence as the place of occurrence of the external cause: Secondary | ICD-10-CM | POA: Diagnosis not present

## 2018-07-04 DIAGNOSIS — Z888 Allergy status to other drugs, medicaments and biological substances status: Secondary | ICD-10-CM | POA: Diagnosis not present

## 2018-07-04 DIAGNOSIS — E042 Nontoxic multinodular goiter: Secondary | ICD-10-CM | POA: Diagnosis present

## 2018-07-04 DIAGNOSIS — Z79891 Long term (current) use of opiate analgesic: Secondary | ICD-10-CM

## 2018-07-04 DIAGNOSIS — Z7984 Long term (current) use of oral hypoglycemic drugs: Secondary | ICD-10-CM

## 2018-07-04 DIAGNOSIS — Z6841 Body Mass Index (BMI) 40.0 and over, adult: Secondary | ICD-10-CM

## 2018-07-04 DIAGNOSIS — J9621 Acute and chronic respiratory failure with hypoxia: Secondary | ICD-10-CM | POA: Diagnosis present

## 2018-07-04 DIAGNOSIS — J9811 Atelectasis: Secondary | ICD-10-CM | POA: Diagnosis present

## 2018-07-04 DIAGNOSIS — Z79890 Hormone replacement therapy (postmenopausal): Secondary | ICD-10-CM

## 2018-07-04 DIAGNOSIS — E1169 Type 2 diabetes mellitus with other specified complication: Secondary | ICD-10-CM | POA: Diagnosis present

## 2018-07-04 DIAGNOSIS — J45901 Unspecified asthma with (acute) exacerbation: Secondary | ICD-10-CM

## 2018-07-04 DIAGNOSIS — N179 Acute kidney failure, unspecified: Secondary | ICD-10-CM | POA: Diagnosis not present

## 2018-07-04 DIAGNOSIS — W19XXXA Unspecified fall, initial encounter: Secondary | ICD-10-CM

## 2018-07-04 DIAGNOSIS — Z86718 Personal history of other venous thrombosis and embolism: Secondary | ICD-10-CM | POA: Diagnosis not present

## 2018-07-04 DIAGNOSIS — M25551 Pain in right hip: Secondary | ICD-10-CM | POA: Diagnosis present

## 2018-07-04 DIAGNOSIS — Z7951 Long term (current) use of inhaled steroids: Secondary | ICD-10-CM

## 2018-07-04 DIAGNOSIS — G4733 Obstructive sleep apnea (adult) (pediatric): Secondary | ICD-10-CM | POA: Diagnosis present

## 2018-07-04 DIAGNOSIS — Z9981 Dependence on supplemental oxygen: Secondary | ICD-10-CM

## 2018-07-04 DIAGNOSIS — G8929 Other chronic pain: Secondary | ICD-10-CM | POA: Diagnosis present

## 2018-07-04 DIAGNOSIS — Z833 Family history of diabetes mellitus: Secondary | ICD-10-CM

## 2018-07-04 DIAGNOSIS — Z79899 Other long term (current) drug therapy: Secondary | ICD-10-CM

## 2018-07-04 DIAGNOSIS — E119 Type 2 diabetes mellitus without complications: Secondary | ICD-10-CM | POA: Diagnosis present

## 2018-07-04 DIAGNOSIS — I5043 Acute on chronic combined systolic (congestive) and diastolic (congestive) heart failure: Secondary | ICD-10-CM | POA: Diagnosis present

## 2018-07-04 DIAGNOSIS — R221 Localized swelling, mass and lump, neck: Secondary | ICD-10-CM | POA: Diagnosis present

## 2018-07-04 DIAGNOSIS — Z91030 Bee allergy status: Secondary | ICD-10-CM

## 2018-07-04 DIAGNOSIS — K5900 Constipation, unspecified: Secondary | ICD-10-CM | POA: Diagnosis present

## 2018-07-04 DIAGNOSIS — Z808 Family history of malignant neoplasm of other organs or systems: Secondary | ICD-10-CM

## 2018-07-04 DIAGNOSIS — I509 Heart failure, unspecified: Secondary | ICD-10-CM

## 2018-07-04 DIAGNOSIS — D649 Anemia, unspecified: Secondary | ICD-10-CM | POA: Diagnosis present

## 2018-07-04 DIAGNOSIS — R0789 Other chest pain: Secondary | ICD-10-CM | POA: Diagnosis not present

## 2018-07-04 DIAGNOSIS — I5041 Acute combined systolic (congestive) and diastolic (congestive) heart failure: Secondary | ICD-10-CM | POA: Diagnosis not present

## 2018-07-04 LAB — CBC WITH DIFFERENTIAL/PLATELET
Abs Immature Granulocytes: 0.14 10*3/uL — ABNORMAL HIGH (ref 0.00–0.07)
Basophils Absolute: 0 10*3/uL (ref 0.0–0.1)
Basophils Relative: 0 %
EOS PCT: 2 %
Eosinophils Absolute: 0.2 10*3/uL (ref 0.0–0.5)
HCT: 37.6 % (ref 36.0–46.0)
HEMOGLOBIN: 10.9 g/dL — AB (ref 12.0–15.0)
Immature Granulocytes: 1 %
Lymphocytes Relative: 14 %
Lymphs Abs: 1.9 10*3/uL (ref 0.7–4.0)
MCH: 28.2 pg (ref 26.0–34.0)
MCHC: 29 g/dL — ABNORMAL LOW (ref 30.0–36.0)
MCV: 97.4 fL (ref 80.0–100.0)
Monocytes Absolute: 0.6 10*3/uL (ref 0.1–1.0)
Monocytes Relative: 5 %
Neutro Abs: 10 10*3/uL — ABNORMAL HIGH (ref 1.7–7.7)
Neutrophils Relative %: 78 %
Platelets: 243 10*3/uL (ref 150–400)
RBC: 3.86 MIL/uL — ABNORMAL LOW (ref 3.87–5.11)
RDW: 12.9 % (ref 11.5–15.5)
WBC: 12.9 10*3/uL — ABNORMAL HIGH (ref 4.0–10.5)
nRBC: 0 % (ref 0.0–0.2)

## 2018-07-04 LAB — GLUCOSE, CAPILLARY
Glucose-Capillary: 110 mg/dL — ABNORMAL HIGH (ref 70–99)
Glucose-Capillary: 130 mg/dL — ABNORMAL HIGH (ref 70–99)
Glucose-Capillary: 94 mg/dL (ref 70–99)

## 2018-07-04 LAB — BASIC METABOLIC PANEL
Anion gap: 10 (ref 5–15)
BUN: 9 mg/dL (ref 6–20)
CO2: 31 mmol/L (ref 22–32)
CREATININE: 0.56 mg/dL (ref 0.44–1.00)
Calcium: 8.7 mg/dL — ABNORMAL LOW (ref 8.9–10.3)
Chloride: 99 mmol/L (ref 98–111)
GFR calc Af Amer: >60 mL/min (ref >60)
GFR calc non Af Amer: >60 mL/min (ref >60)
Glucose, Bld: 120 mg/dL — ABNORMAL HIGH (ref 70–99)
Potassium: 4.4 mmol/L (ref 3.5–5.1)
SODIUM: 140 mmol/L (ref 135–145)

## 2018-07-04 LAB — BRAIN NATRIURETIC PEPTIDE: B Natriuretic Peptide: 67.5 pg/mL (ref 0.0–100.0)

## 2018-07-04 MED ORDER — ACETAMINOPHEN 325 MG PO TABS: 650.0000 mg | ORAL_TABLET | ORAL | Status: DC | PRN

## 2018-07-04 MED ORDER — ALLOPURINOL 100 MG PO TABS
100.0000 mg | ORAL_TABLET | Freq: Every day | ORAL | Status: DC
  Administered 2018-07-04 – 2018-07-11 (×8): 100 mg via ORAL
  Filled 2018-07-04 (×8): qty 1

## 2018-07-04 MED ORDER — METHOCARBAMOL 500 MG PO TABS
500.0000 mg | ORAL_TABLET | Freq: Three times a day (TID) | ORAL | Status: DC | PRN
  Administered 2018-07-04 – 2018-07-07 (×2): 500 mg via ORAL
  Filled 2018-07-04 (×2): qty 1

## 2018-07-04 MED ORDER — GUAIFENESIN 100 MG/5ML PO SOLN
5.0000 mL | ORAL | Status: DC | PRN
  Administered 2018-07-04 – 2018-07-07 (×3): 100 mg via ORAL
  Filled 2018-07-04 (×2): qty 5
  Filled 2018-07-04: qty 25

## 2018-07-04 MED ORDER — HYDROCODONE-ACETAMINOPHEN 5-325 MG PO TABS
1.0000 | ORAL_TABLET | Freq: Four times a day (QID) | ORAL | Status: DC | PRN
  Administered 2018-07-04 – 2018-07-06 (×4): 1 via ORAL
  Filled 2018-07-04 (×5): qty 1

## 2018-07-04 MED ORDER — FUROSEMIDE 10 MG/ML IJ SOLN
40.0000 mg | Freq: Two times a day (BID) | INTRAMUSCULAR | Status: DC
  Administered 2018-07-04 – 2018-07-07 (×7): 40 mg via INTRAVENOUS
  Filled 2018-07-04 (×7): qty 4

## 2018-07-04 MED ORDER — ALBUTEROL SULFATE (2.5 MG/3ML) 0.083% IN NEBU
INHALATION_SOLUTION | RESPIRATORY_TRACT | Status: AC
  Filled 2018-07-04: qty 6

## 2018-07-04 MED ORDER — IPRATROPIUM-ALBUTEROL 0.5-2.5 (3) MG/3ML IN SOLN
3.0000 mL | Freq: Four times a day (QID) | RESPIRATORY_TRACT | Status: DC
  Administered 2018-07-04 – 2018-07-05 (×4): 3 mL via RESPIRATORY_TRACT
  Filled 2018-07-04 (×4): qty 3

## 2018-07-04 MED ORDER — LORATADINE 10 MG PO TABS
10.0000 mg | ORAL_TABLET | Freq: Every day | ORAL | Status: DC
  Administered 2018-07-04 – 2018-07-11 (×8): 10 mg via ORAL
  Filled 2018-07-04 (×8): qty 1

## 2018-07-04 MED ORDER — IPRATROPIUM BROMIDE 0.02 % IN SOLN
0.5000 mg | Freq: Once | RESPIRATORY_TRACT | Status: AC
  Administered 2018-07-04: 0.5 mg via RESPIRATORY_TRACT
  Filled 2018-07-04: qty 2.5

## 2018-07-04 MED ORDER — CLONAZEPAM 0.5 MG PO TABS
1.0000 mg | ORAL_TABLET | Freq: Once | ORAL | Status: AC
  Administered 2018-07-04: 1 mg via ORAL
  Filled 2018-07-04: qty 2

## 2018-07-04 MED ORDER — SODIUM CHLORIDE 0.9 % IV SOLN
250.0000 mL | INTRAVENOUS | Status: DC | PRN
  Administered 2018-07-05: 250 mL via INTRAVENOUS

## 2018-07-04 MED ORDER — ONDANSETRON HCL 4 MG/2ML IJ SOLN: 4.0000 mg | Freq: Four times a day (QID) | INTRAMUSCULAR | Status: DC | PRN

## 2018-07-04 MED ORDER — IPRATROPIUM BROMIDE 0.02 % IN SOLN
RESPIRATORY_TRACT | Status: AC
  Filled 2018-07-04: qty 2.5

## 2018-07-04 MED ORDER — IPRATROPIUM-ALBUTEROL 0.5-2.5 (3) MG/3ML IN SOLN: 3.0000 mL | Freq: Four times a day (QID) | RESPIRATORY_TRACT | Status: DC

## 2018-07-04 MED ORDER — LEVOTHYROXINE SODIUM 112 MCG PO TABS
224.0000 ug | ORAL_TABLET | Freq: Every day | ORAL | Status: DC
  Administered 2018-07-04 – 2018-07-11 (×8): 224 ug via ORAL
  Filled 2018-07-04 (×9): qty 2

## 2018-07-04 MED ORDER — PRAVASTATIN SODIUM 40 MG PO TABS
40.0000 mg | ORAL_TABLET | Freq: Every evening | ORAL | Status: DC
  Administered 2018-07-04 – 2018-07-10 (×6): 40 mg via ORAL
  Filled 2018-07-04 (×6): qty 1

## 2018-07-04 MED ORDER — BUTALBITAL-APAP-CAFFEINE 50-325-40 MG PO TABS: 1.0000 | ORAL_TABLET | ORAL | Status: DC | PRN

## 2018-07-04 MED ORDER — FUROSEMIDE 10 MG/ML IJ SOLN: 40.0000 mg | Freq: Once | INTRAMUSCULAR | Status: DC

## 2018-07-04 MED ORDER — LISINOPRIL 5 MG PO TABS
5.0000 mg | ORAL_TABLET | Freq: Every day | ORAL | Status: DC
  Administered 2018-07-04 – 2018-07-11 (×8): 5 mg via ORAL
  Filled 2018-07-04 (×8): qty 1

## 2018-07-04 MED ORDER — UMECLIDINIUM-VILANTEROL 62.5-25 MCG/INH IN AEPB
1.0000 | INHALATION_SPRAY | Freq: Every day | RESPIRATORY_TRACT | Status: DC
  Administered 2018-07-05 – 2018-07-11 (×7): 1 via RESPIRATORY_TRACT
  Filled 2018-07-04 (×2): qty 14

## 2018-07-04 MED ORDER — SPIRONOLACTONE 25 MG PO TABS
100.0000 mg | ORAL_TABLET | Freq: Every day | ORAL | Status: DC
  Administered 2018-07-04 – 2018-07-11 (×8): 100 mg via ORAL
  Filled 2018-07-04 (×8): qty 4

## 2018-07-04 MED ORDER — PANTOPRAZOLE SODIUM 40 MG PO TBEC
40.0000 mg | DELAYED_RELEASE_TABLET | Freq: Every day | ORAL | Status: DC
  Administered 2018-07-04 – 2018-07-11 (×8): 40 mg via ORAL
  Filled 2018-07-04: qty 1
  Filled 2018-07-04: qty 2
  Filled 2018-07-04 (×6): qty 1

## 2018-07-04 MED ORDER — METFORMIN HCL 500 MG PO TABS
500.0000 mg | ORAL_TABLET | Freq: Two times a day (BID) | ORAL | Status: DC
  Administered 2018-07-04 – 2018-07-10 (×12): 500 mg via ORAL
  Filled 2018-07-04 (×13): qty 1

## 2018-07-04 MED ORDER — ALBUTEROL SULFATE (2.5 MG/3ML) 0.083% IN NEBU
5.0000 mg | INHALATION_SOLUTION | Freq: Once | RESPIRATORY_TRACT | Status: AC
  Administered 2018-07-04: 5 mg via RESPIRATORY_TRACT
  Filled 2018-07-04: qty 6

## 2018-07-04 MED ORDER — TIOTROPIUM BROMIDE MONOHYDRATE 18 MCG IN CAPS: 18.0000 ug | ORAL_CAPSULE | Freq: Every day | RESPIRATORY_TRACT | Status: DC

## 2018-07-04 MED ORDER — FUROSEMIDE 10 MG/ML IJ SOLN
40.0000 mg | Freq: Once | INTRAMUSCULAR | Status: AC
  Administered 2018-07-04: 40 mg via INTRAVENOUS
  Filled 2018-07-04: qty 4

## 2018-07-04 MED ORDER — SODIUM CHLORIDE 0.9% FLUSH
3.0000 mL | INTRAVENOUS | Status: DC | PRN
  Administered 2018-07-09: 3 mL via INTRAVENOUS
  Filled 2018-07-04: qty 3

## 2018-07-04 MED ORDER — CLONAZEPAM 0.5 MG PO TABS
2.0000 mg | ORAL_TABLET | Freq: Three times a day (TID) | ORAL | Status: DC
  Administered 2018-07-04 – 2018-07-11 (×22): 2 mg via ORAL
  Filled 2018-07-04 (×22): qty 4

## 2018-07-04 MED ORDER — SODIUM CHLORIDE 0.9% FLUSH
3.0000 mL | Freq: Two times a day (BID) | INTRAVENOUS | Status: DC
  Administered 2018-07-04 – 2018-07-10 (×13): 3 mL via INTRAVENOUS

## 2018-07-04 MED ORDER — ORAL CARE MOUTH RINSE
15.0000 mL | Freq: Two times a day (BID) | OROMUCOSAL | Status: DC
  Administered 2018-07-06 – 2018-07-11 (×7): 15 mL via OROMUCOSAL

## 2018-07-04 MED ORDER — ENOXAPARIN SODIUM 40 MG/0.4ML ~~LOC~~ SOLN
40.0000 mg | SUBCUTANEOUS | Status: DC
  Administered 2018-07-04 – 2018-07-08 (×5): 40 mg via SUBCUTANEOUS
  Filled 2018-07-04 (×5): qty 0.4

## 2018-07-04 MED ORDER — NITROGLYCERIN 0.4 MG SL SUBL: 0.4000 mg | SUBLINGUAL_TABLET | SUBLINGUAL | Status: DC | PRN

## 2018-07-04 MED ORDER — LIDOCAINE 4 % EX CREA
TOPICAL_CREAM | Freq: Once | CUTANEOUS | Status: AC
  Administered 2018-07-04: 1 via TOPICAL
  Filled 2018-07-04: qty 5

## 2018-07-04 MED ORDER — HYDROCODONE-ACETAMINOPHEN 5-325 MG PO TABS
2.0000 | ORAL_TABLET | Freq: Once | ORAL | Status: AC
  Administered 2018-07-04: 2 via ORAL
  Filled 2018-07-04: qty 2

## 2018-07-04 NOTE — ED Triage Notes (Signed)
Pt coming by EMS after complaints with shob and bilateral feet swelling since Monday. Pt 79-80% on [place] with EMS. Placed on CPAP and sat rose to 97% BP on scene 200/100 then in roue 146/86. No fevers noted. Stated she had some chest tightness upon arrival to ER bay

## 2018-07-04 NOTE — TOC Initial Note (Signed)
Transition of Care [facility]) - Initial/Assessment Note    Patient Details  Name: Michele Owens MRN: [id number] Date of Birth: 04-27-1960  [facility]) CM/SW Contact:    Sherrilyn Rist Phone Number: [phone] 07/04/2018, 10:36 AM  Clinical Narrative:                 Patient lives at home alone; PCP: [name], MD; has Medicaid for private insurance; she does not remember the name of her pharmacy- on [facility]; she is established with [facility] ( [place]) for HHPT as prior to admission; DME - home oxygen through [place], Nebulizer machine, walker and cane; she use [employer] for transportation; CM will continue to follow for progression of care. Expected Discharge Plan: [place]     Patient Goals and CMS Choice Patient states their goals for this hospitalization and ongoing recovery are:: to breathe better CMS Medicare.gov Compare Post Acute Care list provided to:: Patient Choice offered to / list presented to : Patient  Expected Discharge Plan and Services Expected Discharge Plan: [place]   Discharge Planning Services: CM Consult Post Acute Care Choice: [place] arrangements for the past 2 months: Apartment                   DME Agency: NA HH Arranged: PT [place] Agency: [facility] ([facility])  Prior Living Arrangements/Services Living arrangements for the past 2 months: Apartment Lives with:: Self Patient language and need for interpreter reviewed:: No Do you feel safe going back to the place where you live?: Yes      Need for Family Participation in Patient Care: No (Comment) Care giver support system in place?: Yes (comment) Current home services: Home PT Criminal Activity/Legal Involvement Pertinent to Current Situation/Hospitalization: No - Comment as needed  Activities of Daily Living Home Assistive Devices/Equipment: Cane (specify quad or straight), Eyeglasses, Shower chair without  back, CBG Meter, Blood pressure cuff ADL Screening (condition at time of admission) Patient's cognitive ability adequate to safely complete daily activities?: Yes Is the patient deaf or have difficulty hearing?: No Does the patient have difficulty seeing, even when wearing glasses/contacts?: No Does the patient have difficulty concentrating, remembering, or making decisions?: No Patient able to express need for assistance with ADLs?: Yes Does the patient have difficulty dressing or bathing?: No Independently performs ADLs?: Yes (appropriate for developmental age) Does the patient have difficulty walking or climbing stairs?: Yes Weakness of Legs: Both Weakness of Arms/Hands: None  Permission Sought/Granted Permission sought to share information with : Case Manager Permission granted to share information with : Yes, Verbal Permission Granted  Share Information with NAME: [name] daughter  Permission granted to share info w AGENCY: [place] agency        Emotional Assessment Appearance:: Appears stated age Attitude/Demeanor/Rapport: Gracious Affect (typically observed): Accepting Orientation: : Oriented to Self, Oriented to  Time, Oriented to Place, Oriented to Situation Alcohol / Substance Use: Not Applicable Psych Involvement: No (comment)  Admission diagnosis:  Acute diastolic congestive heart failure (HCC) [I50.31] Acute heart failure ([place]) [I50.9] Patient Active Problem List   Diagnosis Date Noted  . CHF exacerbation ([place]) 07/04/2018  . Acute heart failure ([place]) 07/04/2018  . Fall at home, initial encounter 06/23/2018  . HNP (herniated nucleus pulposus), thoracic 06/23/2018  . Sprain of shoulder, left 06/18/2018  . Strain of thoracic back region 06/18/2018  . Strain of lumbar region 06/18/2018  . CHF (congestive heart failure) ([place])  06/18/2018  . Pain 06/17/2018  . Hypertensive disorder 08/28/2017  . Obesity 08/28/2017  . COPD exacerbation ([place]) 08/04/2017  . Acute and  chronic respiratory failure with hypercapnia ([place])   . Acute on chronic diastolic heart failure ([place])   . Pulmonary HTN ([place])   . Genetic testing 02/27/2017  . Family history of thyroid cancer   . Hypomagnesemia 10/03/2016  . Boil, breast 10/03/2016  . Atypical chest pain   . HLD (hyperlipidemia) 09/23/2016  . COPD (chronic obstructive pulmonary disease) ([place]) 09/23/2016  . Gout 09/23/2016  . DVT (deep vein thrombosis) in pregnancy 09/23/2016  . Thyroid cancer ([place]) 09/23/2016  . Hypocalcemia 09/23/2016  . AKI (acute kidney injury) ([place]) 09/23/2016  . 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 Acute pulmonary edema (HCC)   . Acute hypoxemic respiratory failure ([place])   . Acute on chronic respiratory failure with hypoxemia ([place])   . Ventilator dependent ([place])   . Respiratory failure ([place]) 07/16/2016  . Chronic combined systolic (congestive) and diastolic (congestive) heart failure ([place]) 02/10/2016  . Dyspnea   . CAP (community acquired pneumonia) 01/23/2016  . 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 Pulmonary hypertension ([place]) 01/23/2016  . Sepsis ([place]) 01/23/2016  . Obesity hypoventilation syndrome ([place]) 08/26/2015  . Chest pain 06/16/2015  . Dyslipidemia associated with type 2 diabetes mellitus ([place]) 04/24/2015  . Leukocytosis 04/24/2015  . Controlled type 2 diabetes mellitus without complication, without long-term current use of insulin ([place]) 04/24/2015  . Anxiety 04/24/2015  . Benign essential HTN 04/24/2015  . Normocytic anemia 04/24/2015  . Asthma with COPD with exacerbation ([place]) 04/24/2015  . OSA (obstructive sleep apnea) 05/10/2012  . Tobacco abuse 07/04/2011   PCP:  [name], MD Pharmacy:   [place], [place] - [address] [place] [address] Phone: [phone] Fax: [place], [place] - [address] [place] [address] Phone: [phone] Fax: [phone]     Social Determinants of Health (SDOH) Interventions    Readmission Risk Interventions No flowsheet data found.

## 2018-07-04 NOTE — ED Provider Notes (Signed)
[facility] EMERGENCY DEPARTMENT Provider Note   CSN: [id number] Arrival date & time: 07/04/18  0445    History   Chief Complaint Chief Complaint  Patient presents with  . Shortness of Breath   Level 5 caveat due to acuity of condition  HPI Michele Owens is a 58 y.o. female.     The history is provided by the patient and the EMS personnel.  Shortness of Breath  Severity:  Severe Onset quality:  Gradual Timing:  Constant Progression:  Worsening Chronicity:  New Relieved by:  Nothing Worsened by:  Activity Associated symptoms: no chest pain, no cough and no fever    Patient with history of CHF, COPD on home oxygen 2 L, morbid obesity presents with shortness of breath the past several days.  She reports increasing lower extremity edema.  EMS was called and noted the patient was hypoxic.  She was placed on CPAP with improvement. Past Medical History:  Diagnosis Date  . Anxiety   . Arthritis   . Asthma   . Chronic diastolic CHF (congestive heart failure) ([place])   . COPD (chronic obstructive pulmonary disease) (HCC)    Uses Oxygen at night  . Depression   . Diabetes mellitus without complication ([place])   . Dyspnea    occ  . Family history of thyroid cancer   . GERD (gastroesophageal reflux disease)   . Gout   . Headache    migraines  . History of DVT of lower extremity   . History of nuclear stress test    Myoview 2/17:  Low risk stress nuclear study with a small, moderate intensity, partially reversible inferior lateral defect consistent with small prior infarct and minimal peri-infarct ischemia; EF 68 with normal wall motion  . Hypertension   . Pneumonia   . Thyroid cancer ([place]) 09/23/2016    Patient Active Problem List   Diagnosis Date Noted  . Fall at home, initial encounter 06/23/2018  . HNP (herniated nucleus pulposus), thoracic 06/23/2018  . Sprain of shoulder, left 06/18/2018  . Strain of thoracic back region 06/18/2018  . Strain of  lumbar region 06/18/2018  . CHF (congestive heart failure) ([place]) 06/18/2018  . Pain 06/17/2018  . Hypertensive disorder 08/28/2017  . Obesity 08/28/2017  . COPD exacerbation ([place]) 08/04/2017  . Acute and chronic respiratory failure with hypercapnia ([place])   . Acute on chronic diastolic heart failure ([place])   . Pulmonary HTN ([place])   . Genetic testing 02/27/2017  . Family history of thyroid cancer   . Hypomagnesemia 10/03/2016  . Boil, breast 10/03/2016  . Atypical chest pain   . HLD (hyperlipidemia) 09/23/2016  . COPD (chronic obstructive pulmonary disease) ([place]) 09/23/2016  . Gout 09/23/2016  . DVT (deep vein thrombosis) in pregnancy 09/23/2016  . Thyroid cancer ([place]) 09/23/2016  . Hypocalcemia 09/23/2016  . AKI (acute kidney injury) ([place]) 09/23/2016  . 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 Acute pulmonary edema (HCC)   . Acute hypoxemic respiratory failure ([place])   . Acute on chronic respiratory failure with hypoxemia ([place])   . Ventilator dependent ([place])   . Respiratory failure ([place]) 07/16/2016  . Chronic combined systolic (congestive) and diastolic (congestive) heart failure ([place]) 02/10/2016  . Dyspnea   . CAP (community acquired pneumonia) 01/23/2016  . 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 Pulmonary hypertension ([place]) 01/23/2016  . Sepsis ([place]) 01/23/2016  . Obesity hypoventilation syndrome ([place]) 08/26/2015  . Chest pain 06/16/2015  .  Dyslipidemia associated with type 2 diabetes mellitus ([place]) 04/24/2015  . Leukocytosis 04/24/2015  . Controlled type 2 diabetes mellitus without complication, without long-term current use of insulin ([place]) 04/24/2015  . Anxiety 04/24/2015  . Benign essential HTN 04/24/2015  . Normocytic anemia 04/24/2015  . Asthma with COPD with exacerbation ([place]) 04/24/2015  . OSA (obstructive sleep apnea) 05/10/2012  . Tobacco abuse 07/04/2011    Past Surgical History:  Procedure Laterality Date  .  BUNIONECTOMY Bilateral   . CARDIAC CATHETERIZATION N/A 06/23/2015   Procedure: Right/Left Heart Cath and Coronary Angiography;  Surgeon: [name], MD;  Location: [facility];  Service: Cardiovascular;  Laterality: N/A;  . COLONOSCOPY WITH PROPOFOL N/A 12/15/2015   Procedure: COLONOSCOPY WITH PROPOFOL;  Surgeon: [name], MD;  Location: [facility];  Service: Endoscopy;  Laterality: N/A;  . THYROIDECTOMY  09/19/2016  . THYROIDECTOMY N/A 09/19/2016   Procedure: TOTAL THYROIDECTOMY;  Surgeon: [name], MD;  Location: [facility];  Service: General;  Laterality: N/A;  . TONSILLECTOMY    . TOTAL ABDOMINAL HYSTERECTOMY  07/14/10     OB History   No obstetric history on file.      Home Medications    Prior to Admission medications   Medication Sig Start Date End Date Taking? Authorizing Provider  albuterol (PROVENTIL HFA;VENTOLIN HFA) 108 (90 Base) MCG/ACT inhaler Inhale 1-2 puffs into the lungs every 6 (six) hours as needed for wheezing or shortness of breath. 04/23/18   Long, [name], MD  ALLERGY RELIEF 10 MG tablet Take 10 mg by mouth daily. 04/11/18   [provider]  allopurinol (ZYLOPRIM) 100 MG tablet Take 100 mg by mouth daily.    [provider]  ANORO ELLIPTA 62.5-25 MCG/INH AEPB Inhale 1 puff into the lungs daily.  04/11/18   [provider]  Blood Glucose Monitoring Suppl (ACCU-CHEK AVIVA PLUS) w/Device KIT USE AS DIRECTED TO CHECK BLOOD SUGAR 08/07/17   [provider]  butalbital-acetaminophen-caffeine (FIORICET, ESGIC) 50-325-40 MG tablet Take 1-2 tablets by mouth every 4 (four) hours as needed for migraine. 06/13/18   [provider]  calcitRIOL (ROCALTROL) 0.5 MCG capsule Take 1 capsule (0.5 mcg total) by mouth daily. 09/26/16   Hongalgi, [name], MD  carvedilol (COREG) 6.25 MG tablet Take 1 tablet (6.25 mg total) by mouth 2 (two) times daily with a meal. 01/23/18   Bensimhon, [name], MD  cetirizine (ZYRTEC) 10 MG tablet Take 10  mg by mouth daily. 07/10/17   [provider]  clonazePAM (KLONOPIN) 2 MG tablet Take 2 mg by mouth 3 (three) times daily. for anxiety 05/08/17   [provider]  diclofenac sodium (VOLTAREN) 1 % GEL Apply 2 g topically 4 (four) times daily as needed (for pain). 06/19/18   [name], MD  EPINEPHrine 0.3 mg/0.3 mL IJ SOAJ injection Inject 0.3 mg into the muscle daily as needed (allergic reaction).    [provider]  FEROSUL 325 (65 Fe) MG tablet Take 325 mg by mouth daily. 03/22/17   [provider]  furosemide (LASIX) 20 MG tablet Take 1 tablet (20 mg total) by mouth daily. 06/19/18   [name], MD  glucose blood test strip Use as instructed to check blood sugar up to 4 times daily 08/06/17   [name], DO  HYDROcodone-acetaminophen (NORCO/VICODIN) 5-325 MG tablet Take 1 tablet by mouth every 6 (six) hours as needed for moderate pain.  02/12/18   [provider]  ipratropium-albuterol (DUONEB)  0.5-2.5 (3) MG/3ML SOLN Take 3 mLs by nebulization 4 (four) times daily.  04/05/17   [provider]  levothyroxine (SYNTHROID, LEVOTHROID) 112 MCG tablet Take 224 mcg by mouth daily. 07/11/17   [provider]  lisinopril (PRINIVIL,ZESTRIL) 5 MG tablet Take 1 tablet (5 mg total) by mouth daily. Please call for office visit [phone] 08/15/17   [name], MD  loperamide (IMODIUM) 2 MG capsule Take 1 capsule (2 mg total) by mouth 4 (four) times daily as needed for diarrhea or loose stools. 06/27/21   [name], MD  metFORMIN (GLUCOPHAGE) 500 MG tablet Take 1 tablet (500 mg total) by mouth 2 (two) times daily with a meal. 12/11/14   [name], MD  methocarbamol (ROBAXIN) 500 MG tablet Take 1 tablet (500 mg total) by mouth every 8 (eight) hours as needed for muscle spasms. 06/19/18   [name], MD  metoCLOPramide (REGLAN) 10 MG tablet Take 1 tablet (10 mg total) by mouth every 6 (six) hours as needed for nausea (or  headache). 3/43/56   [name], MD  metoprolol succinate (TOPROL-XL) 25 MG 24 hr tablet Take 1 tablet (25 mg total) by mouth daily. 08/07/17   [name], DO  nitroGLYCERIN (NITROSTAT) 0.4 MG SL tablet Place 1 tablet (0.4 mg total) under the tongue every 5 (five) minutes as needed for chest pain. 06/19/18   [name], MD  nystatin cream (MYCOSTATIN) Apply 1 application topically 2 (two) times daily as needed for dry skin.  04/13/17   [provider]  omeprazole (PRILOSEC) 40 MG capsule Take 40 mg by mouth daily. 04/13/15   [provider]  OXYGEN Inhale 2 L into the lungs at bedtime.    [provider]  potassium chloride SA (K-DUR,KLOR-CON) 20 MEQ tablet Take 2 tablets (40 mEq total) by mouth daily. Patient taking differently: Take 20 mEq by mouth daily.  08/01/16   [name], MD  pravastatin (PRAVACHOL) 40 MG tablet Take 40 mg by mouth every evening.  03/29/15   [provider]  QC STOOL SOFTENER PLS LAXATIVE 8.6-50 MG tablet Take 1 tablet by mouth at bedtime. 04/11/17   [provider]  spironolactone (ALDACTONE) 100 MG tablet Take 100 mg by mouth daily. 10/02/17   [provider]  tiotropium (SPIRIVA) 18 MCG inhalation capsule Place 18 mcg into inhaler and inhale daily.    [provider]    Family History Family History  Problem Relation Age of Onset  . Cancer Father        thought to be due to exposure to concrete  . Diabetes Mother   . Thyroid cancer Mother        dx in her 40s-60s  . Cancer Maternal Uncle        2 uncles with cancer NOS  . Brain cancer Paternal Aunt   . Cancer Cousin        maternal first cousin - NOS  . Cancer Cousin        maternal first cousin - NOS    Social History Social History   Tobacco Use  . Smoking status: Former Smoker    Packs/day: 0.50    Years: 41.00    Pack years: 20.50    Types: Cigarettes    Last attempt to quit: 07/2016    Years since quitting: 1.9  .  Smokeless tobacco: Never Used  . Tobacco comment: Starting to Wean Off -- using Nicotine Patch  Substance Use  Topics  . Alcohol use: Not Currently    Alcohol/week: 0.0 standard drinks    Comment: ? none now  . Drug use: Never    Comment: none for long time     Allergies   Bee venom; Fioricet [butalbital-apap-caffeine]; Ibuprofen; Lamisil [terbinafine]; and Nsaids   Review of Systems Review of Systems  Unable to perform ROS: Acuity of condition  Constitutional: Negative for fever.  Respiratory: Positive for shortness of breath. Negative for cough.   Cardiovascular: Negative for chest pain.     Physical Exam Updated Vital Signs BP (!) 121/57   Pulse 97   Temp 99.7 F (37.6 C) (Rectal)   Resp (!) 21   Ht 1.651 m ('5\' 5"' )   Wt (!) 141 kg   SpO2 98%   BMI 51.73 kg/m   Physical Exam CONSTITUTIONAL: Ill-appearing, appears older than stated age HEAD: Normocephalic/atraumatic EYES: EOMI ENMT: Mucous membranes moist NECK: supple no meningeal signs SPINE/BACK:entire spine nontender CV: S1/S2 noted, no murmurs/rubs/gallops noted LUNGS: Tachypneic, scattered wheezes noted, exam limited due to body size ABDOMEN: soft, nontender, base GU:no cva tenderness NEURO: Pt is awake/alert/appropriate, moves all extremitiesx4.  No facial droop.   EXTREMITIES: pulses normal/equal, full ROM, lower extremity edema noted SKIN: warm, color normal PSYCH: no abnormalities of mood noted, alert and oriented to situation  ED Treatments / Results  Labs (all labs ordered are listed, but only abnormal results are displayed) Labs Reviewed  BASIC METABOLIC PANEL - Abnormal; Notable for the following components:      Result Value   Glucose, Bld 120 (*)    Calcium 8.7 (*)    All other components within normal limits  CBC WITH DIFFERENTIAL/PLATELET - Abnormal; Notable for the following components:   WBC 12.9 (*)    RBC 3.86 (*)    Hemoglobin 10.9 (*)    MCHC 29.0 (*)    Neutro Abs 10.0 (*)     Abs Immature Granulocytes 0.14 (*)    All other components within normal limits  BRAIN NATRIURETIC PEPTIDE    EKG EKG Interpretation  Date/Time:  Thursday July 04 2018 04:58:38 EDT Ventricular Rate:  101 PR Interval:    QRS Duration: 77 QT Interval:  334 QTC Calculation: 433 R Axis:   70 Text Interpretation:  Sinus tachycardia Confirmed by Ripley Fraise [id number]) on 07/04/2018 5:10:24 AM   Radiology Dg Chest Port 1 View  Result Date: 07/04/2018 CLINICAL DATA:  Shortness of breath and bilateral feet swelling EXAM: PORTABLE CHEST 1 VIEW COMPARISON:  06/17/2018 FINDINGS: Cardiomegaly and vascular pedicle widening. Diffuse interstitial opacity with Kerley lines and cephalized blood flow. No effusion or pneumothorax. Postoperative thoracic inlet IMPRESSION: CHF. Electronically Signed   By: Monte Fantasia M.D.   On: 07/04/2018 06:42    Procedures Procedures  CRITICAL CARE Performed by: Sharyon Cable Total critical care time: 30 minutes Critical care time was exclusive of separately billable procedures and treating other patients. Critical care was necessary to treat or prevent imminent or life-threatening deterioration. Critical care was time spent personally by me on the following activities: development of treatment plan with patient and/or surrogate as well as nursing, discussions with consultants, evaluation of patient's response to treatment, examination of patient, obtaining history from patient or surrogate, ordering and performing treatments and interventions, ordering and review of laboratory studies, ordering and review of radiographic studies, pulse oximetry and re-evaluation of patient's condition.   Medications Ordered in ED Medications  clonazePAM (KLONOPIN) tablet 1 mg (has no administration in time  range)  albuterol (PROVENTIL) (2.5 MG/3ML) 0.083% nebulizer solution 5 mg (has no administration in time range)  ipratropium (ATROVENT) nebulizer solution 0.5 mg (has  no administration in time range)  furosemide (LASIX) injection 40 mg (40 mg Intravenous Given 07/04/18 0614)  HYDROcodone-acetaminophen (NORCO/VICODIN) 5-325 MG per tablet 2 tablet (2 tablets Oral Given 07/04/18 8527)     Initial Impression / Assessment and Plan / ED Course  I have reviewed the triage vital signs and the nursing notes.  Pertinent labs & imaging results that were available during my care of the patient were reviewed by me and considered in my medical decision making (see chart for details).        4:55 AM Patient with history of CHF/COPD and obesity presents with shortness of breath.  She was placed on CPAP, but this was taken off on arrival due to COVID concerns.  However patient has not had a fever or cough. X-ray and labs are pending this time 6:04 AM Patient's work of breathing has improved. Now reports pain all over her body.  She is requesting pain medication and her "nerve medicine" Imaging pending, but strong suspicion for CHF. 6:36 AM Patient appears to be improving.  Reviewed x-ray appears consistent with pulmonary edema.  She has been given Lasix for diuresis.  This seems to be improving her symptoms.  She reports continued pain all over, and needs her "nerve pills "these medicines been ordered.  Discussed with Dr. Marlowe Sax with [facility] for admission Low suspicion for infectious etiology She is currently stable/appropriate off of non-invasive ventilation  Final Clinical Impressions(s) / ED Diagnoses   Final diagnoses:  Acute diastolic congestive heart failure [facility])    ED Discharge Orders    None       Ripley Fraise, MD 07/04/18 [phone]

## 2018-07-04 NOTE — Progress Notes (Signed)
Patient arrived to unit. Will continue to monitor.

## 2018-07-04 NOTE — Plan of Care (Signed)
  Problem: Education: Goal: Knowledge of General Education information will improve Description: Including pain rating scale, medication(s)/side effects and non-pharmacologic comfort measures Outcome: Progressing   Problem: Health Behavior/Discharge Planning: Goal: Ability to manage health-related needs will improve Outcome: Progressing   Problem: Clinical Measurements: Goal: Respiratory complications will improve Outcome: Progressing   

## 2018-07-04 NOTE — Plan of Care (Signed)
  Problem: Education: Goal: Knowledge of General Education information will improve Description: Including pain rating scale, medication(s)/side effects and non-pharmacologic comfort measures Outcome: Progressing   Problem: Clinical Measurements: Goal: Will remain free from infection Outcome: Progressing   Problem: Activity: Goal: Risk for activity intolerance will decrease Outcome: Progressing   Problem: Nutrition: Goal: Adequate nutrition will be maintained Outcome: Progressing   Problem: Pain Managment: Goal: General experience of comfort will improve Outcome: Progressing   Problem: Safety: Goal: Ability to remain free from injury will improve Outcome: Progressing   

## 2018-07-04 NOTE — H&P (Signed)
HPI  Michele Owens HFG:[id number] DOB: 1961-03-16 DOA: 07/04/2018  PCP: [name], MD   Chief Complaint: Shortness of breath worsening hypoxia  HPI:  58 year old African-American female BMI 51 Diastolic heart failure at baseline EF 60-65% 08/05/2017 COPD Gold stage II-III on 2 L of oxygen at baseline Diabetes mellitus type 2 HTN Prior DVT Hypothyroidism Reflux HLD Gout  [place] concerning for OSA/OHS S  Recent hospital admission 3/9-3/11 2020 she has had a couple of ED visits Her hospitalization was significant for acute on chronic diastolic heart failure-it was felt that her BNP was not a good indicator of her actual fluid status or heart failure symptoms because of her BMI-vascular ultrasound did not reveal a DVT-at that admission she also had herniated disks after a fall at home  Returns to emergency room bilateral feet swelling starting 3/23-O2 sat 79-80%-placed on BiPAP-BP 210/100-wheeze  States since discharge recently from the hospital she has been doing fairly well but her home health aide/nurses have not shown up and she usually has someone to cook for her-she admits to dietary indiscretion eating "a lot of meat"-she also admits that she does not know the daily dose of her Lasix and has been taking Lasix until "she pees a lot" Baseline she is reasonably functional and has been on oxygen for an undetermined time through [place] does not know how long She quit smoking maybe 5 to 10 years ago  She denies specifically any exposure to ill contacts "my doctor did not want anyone coming into my house because I have CHF hypertension and diabetes", she denies any diarrhea, she denies any chills, she denies any Reiger, she endorses no blurred or double vision she has no sore throat  In the field placed on BiPAP, came to emergency room and treatment was started By the time I saw her she was on 4 L of oxygen and had transferred according to nursing staff asked x2 from bed  to chair to pass urine     CXR = pulmonary edema-given Lasix-taken off BiPAP- Glucose 120 rest of be met normal White count 12.9 platelet 243 hemoglobin 10.9 BNP 67.5  ED Course: Albuterol x1, Klonopin x1, Lasix 401, Norco 10/650 x 1, taken off BiPAP  Review of Systems:  Negative except as reviewed above    Past Medical History:  Diagnosis Date   Anxiety    Arthritis    Asthma    Chronic diastolic CHF (congestive heart failure) (HCC)    COPD (chronic obstructive pulmonary disease) (HCC)    Uses Oxygen at night   Depression    Diabetes mellitus without complication (HCC)    Dyspnea    occ   Family history of thyroid cancer    GERD (gastroesophageal reflux disease)    Gout    Headache    migraines   History of DVT of lower extremity    History of nuclear stress test    Myoview 2/17:  Low risk stress nuclear study with a small, moderate intensity, partially reversible inferior lateral defect consistent with small prior infarct and minimal peri-infarct ischemia; EF 68 with normal wall motion   Hypertension    Pneumonia    Thyroid cancer ([place]) 09/23/2016    Past Surgical History:  Procedure Laterality Date   BUNIONECTOMY Bilateral    CARDIAC CATHETERIZATION N/A 06/23/2015   Procedure: Right/Left Heart Cath and Coronary Angiography;  Surgeon: [name], MD;  Location: [facility];  Service: Cardiovascular;  Laterality: N/A;   COLONOSCOPY  WITH PROPOFOL N/A 12/15/2015   Procedure: COLONOSCOPY WITH PROPOFOL;  Surgeon: [name], MD;  Location: [facility];  Service: Endoscopy;  Laterality: N/A;   THYROIDECTOMY  09/19/2016   THYROIDECTOMY N/A 09/19/2016   Procedure: TOTAL THYROIDECTOMY;  Surgeon: [name], MD;  Location: [facility];  Service: General;  Laterality: N/A;   TONSILLECTOMY     TOTAL ABDOMINAL HYSTERECTOMY  07/14/10     reports that she quit smoking about 1 years ago. Her smoking use included cigarettes. She has a 20.50 pack-year  smoking history. She has never used smokeless tobacco. She reports previous alcohol use. She reports that she does not use drugs. Mobility:  At baseline moves around with a walker and is able to mobilize well Allergies  Allergen Reactions   Bee Venom Swelling and Other (See Comments)    "All over my body" (swelling)   Fioricet [Butalbital-Apap-Caffeine] Nausea And Vomiting and Rash   Ibuprofen Rash and Other (See Comments)    Severe rash   Lamisil [Terbinafine] Rash and Other (See Comments)    Pt states this causes her to "feel funny"   Nsaids Other (See Comments)    Per MD's orders     Family History  Problem Relation Age of Onset   Cancer Father        thought to be due to exposure to concrete   Diabetes Mother    Thyroid cancer Mother        dx in her 71s-60s   Cancer Maternal Uncle        2 uncles with cancer NOS   Brain cancer Paternal Aunt    Cancer Cousin        maternal first cousin - NOS   Cancer Cousin        maternal first cousin - NOS     Prior to Admission medications   Medication Sig Start Date End Date Taking? Authorizing Provider  albuterol (PROVENTIL HFA;VENTOLIN HFA) 108 (90 Base) MCG/ACT inhaler Inhale 1-2 puffs into the lungs every 6 (six) hours as needed for wheezing or shortness of breath. 04/23/18   Long, [name], MD  ALLERGY RELIEF 10 MG tablet Take 10 mg by mouth daily. 04/11/18   [provider]  allopurinol (ZYLOPRIM) 100 MG tablet Take 100 mg by mouth daily.    [provider]  ANORO ELLIPTA 62.5-25 MCG/INH AEPB Inhale 1 puff into the lungs daily.  04/11/18   [provider]  Blood Glucose Monitoring Suppl (ACCU-CHEK AVIVA PLUS) w/Device KIT USE AS DIRECTED TO CHECK BLOOD SUGAR 08/07/17   [provider]  butalbital-acetaminophen-caffeine (FIORICET, ESGIC) 50-325-40 MG tablet Take 1-2 tablets by mouth every 4 (four) hours as needed for migraine. 06/13/18   [provider]  calcitRIOL (ROCALTROL) 0.5  MCG capsule Take 1 capsule (0.5 mcg total) by mouth daily. 09/26/16   Hongalgi, [name], MD  carvedilol (COREG) 6.25 MG tablet Take 1 tablet (6.25 mg total) by mouth 2 (two) times daily with a meal. 01/23/18   Bensimhon, [name], MD  cetirizine (ZYRTEC) 10 MG tablet Take 10 mg by mouth daily. 07/10/17   [provider]  clonazePAM (KLONOPIN) 2 MG tablet Take 2 mg by mouth 3 (three) times daily. for anxiety 05/08/17   [provider]  diclofenac sodium (VOLTAREN) 1 % GEL Apply 2 g topically 4 (four) times daily as needed (for pain). 06/19/18   [name], MD  EPINEPHrine 0.3 mg/0.3 mL IJ SOAJ injection Inject 0.3 mg into the  muscle daily as needed (allergic reaction).    [provider]  FEROSUL 325 (65 Fe) MG tablet Take 325 mg by mouth daily. 03/22/17   [provider]  furosemide (LASIX) 20 MG tablet Take 1 tablet (20 mg total) by mouth daily. 06/19/18   [name], MD  glucose blood test strip Use as instructed to check blood sugar up to 4 times daily 08/06/17   [name], DO  HYDROcodone-acetaminophen (NORCO/VICODIN) 5-325 MG tablet Take 1 tablet by mouth every 6 (six) hours as needed for moderate pain.  02/12/18   [provider]  ipratropium-albuterol (DUONEB) 0.5-2.5 (3) MG/3ML SOLN Take 3 mLs by nebulization 4 (four) times daily.  04/05/17   [provider]  levothyroxine (SYNTHROID, LEVOTHROID) 112 MCG tablet Take 224 mcg by mouth daily. 07/11/17   [provider]  lisinopril (PRINIVIL,ZESTRIL) 5 MG tablet Take 1 tablet (5 mg total) by mouth daily. Please call for office visit [phone] 08/15/17   [name], MD  loperamide (IMODIUM) 2 MG capsule Take 1 capsule (2 mg total) by mouth 4 (four) times daily as needed for diarrhea or loose stools. 8/67/67   [name], MD  metFORMIN (GLUCOPHAGE) 500 MG tablet Take 1 tablet (500 mg total) by mouth 2 (two) times daily with a meal. 12/11/14   [name], MD    methocarbamol (ROBAXIN) 500 MG tablet Take 1 tablet (500 mg total) by mouth every 8 (eight) hours as needed for muscle spasms. 06/19/18   [name], MD  metoCLOPramide (REGLAN) 10 MG tablet Take 1 tablet (10 mg total) by mouth every 6 (six) hours as needed for nausea (or headache). 05/19/45   [name], MD  metoprolol succinate (TOPROL-XL) 25 MG 24 hr tablet Take 1 tablet (25 mg total) by mouth daily. 08/07/17   [name], DO  nitroGLYCERIN (NITROSTAT) 0.4 MG SL tablet Place 1 tablet (0.4 mg total) under the tongue every 5 (five) minutes as needed for chest pain. 06/19/18   [name], MD  nystatin cream (MYCOSTATIN) Apply 1 application topically 2 (two) times daily as needed for dry skin.  04/13/17   [provider]  omeprazole (PRILOSEC) 40 MG capsule Take 40 mg by mouth daily. 04/13/15   [provider]  OXYGEN Inhale 2 L into the lungs at bedtime.    [provider]  potassium chloride SA (K-DUR,KLOR-CON) 20 MEQ tablet Take 2 tablets (40 mEq total) by mouth daily. Patient taking differently: Take 20 mEq by mouth daily.  08/01/16   [name], MD  pravastatin (PRAVACHOL) 40 MG tablet Take 40 mg by mouth every evening.  03/29/15   [provider]  QC STOOL SOFTENER PLS LAXATIVE 8.6-50 MG tablet Take 1 tablet by mouth at bedtime. 04/11/17   [provider]  spironolactone (ALDACTONE) 100 MG tablet Take 100 mg by mouth daily. 10/02/17   [provider]  tiotropium (SPIRIVA) 18 MCG inhalation capsule Place 18 mcg into inhaler and inhale daily.    [provider]    Physical Exam:  Mallampati 4 morbidly obese pleasant African-American female no icterus no pallor talking in full sentences No submandibular lymphadenopathy, no thyromegaly, Chest is clinically clear with no added sound, no rales no rhonchi Posteriorly on the back there is a scar in the thoracic region Anterior laterally in the shins she has 1+ pitting  edema I do not appreciate any skin breakdown on my exam She is coherent euthymic and pleasant  and asks me if she can go home   Vitals:   07/04/18 0530 07/04/18 0545  BP: (!) 121/57 116/81  Pulse: 97 99  Resp: (!) 21   Temp:    SpO2: 98% 100%       I have personally reviewed following labs and imaging studies  Labs:   As per HPI  Imaging studies:   See above  Medical tests:   EKG independently reviewed: PR interval 0.04 QRS axis 90 degrees no ST-T wave changes   Principal Problem:   Acute and chronic respiratory failure with hypercapnia (HCC) Active Problems:   OSA (obstructive sleep apnea)   Dyslipidemia associated with type 2 diabetes mellitus (HCC)   Asthma with COPD with exacerbation (HCC)   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   HLD (hyperlipidemia)   Gout   DVT (deep vein thrombosis) in pregnancy   AKI (acute kidney injury) ([place])   Atypical chest pain   Fall at home, initial encounter   HNP (herniated nucleus pulposus), thoracic   CHF exacerbation (HCC)   Assessment/Plan multifactorial mMRC respiratory failure stage III Cardiac >respiratory-diuresis Lasix Hold Coreg in setting of acute exacerbation Continue lisinopril 5 daily apparently also on metoprolol 25 XL?   She will need medication management and reinforcement despite current goal with situation Resuming Aldactone 100 daily in addition COPD Gold stage II-III on home oxygen Oxygen requirements have decreased significantly she is now on 4 L We will continue inhalers-she does not need stepdown can be transferred to telemetry History multinodular goiter Continue Synthroid 224 daily TSH in 3 weeks-low suspicion this is related to thyroid related illness Diabetes mellitus type 2 Continue metformin 500 twice bid Constipation  holding loperamide and Reglan as unclear which is which-she states she needs to use the bathroom once a daily "because 1 medication causes her to use the  bathroom"--resume at discretion of rounding physician in a.m. Reflux Pantoprazole 40 daily Chronic pain from lumbago and prior back surgery Continue opiates BMI 51 Almost definitely OSA Needs weight loss, consider LAP-BAND, Recommend initiation of CPAP prior to discharge based on nightly O2 sats which have been ordered    Severity of Illness: The appropriate patient status for this patient is INPATIENT. Inpatient status is judged to be reasonable and necessary in order to provide the required intensity of service to ensure the patient's safety. The patient's presenting symptoms, physical exam findings, and initial radiographic and laboratory data in the context of their chronic comorbidities is felt to place them at high risk for further clinical deterioration. Furthermore, it is not anticipated that the patient will be medically stable for discharge from the hospital within 2 midnights of admission. The following factors support the patient status of inpatient.   " The patient's presenting symptoms include shortness of breath. " The worrisome physical exam findings include pulmonary edema. " The initial radiographic and laboratory data are worrisome because of pulmonary edema. " The chronic co-morbidities include morbid obesity hypertension heart failure.   * I certify that at the point of admission it is my clinical judgment that the patient will require inpatient hospital care spanning beyond 2 midnights from the point of admission due to high intensity of service, high risk for further deterioration and high frequency of surveillance required.*     DVT prophylaxis: Lovenox Code Status: Full Family Communication: None Consults called: None  Time spent: 28 minutes  Admire Bunnell, MD  [facility] Direct contact: [phone] --[place]ia [place]  --[email or web address]; password [facility]  7PM-7AM contact night coverage as above  07/04/2018, 7:00 AM

## 2018-07-04 NOTE — ED Notes (Signed)
Nurse starting IV and collecting labs. 

## 2018-07-04 NOTE — ED Notes (Signed)
ED TO INPATIENT HANDOFF REPORT  ED Nurse Name and Phone #:  Lenice Pressman [phone]  S Name/Age/Gender Michele Owens 58 y.o. female Room/Bed: 024C/024C  Code Status   Code Status: Prior  Home/SNF/Other Home Patient oriented to: self, place, time and situation Is this baseline? Yes   Triage Complete: Triage complete  Chief Complaint shortness of breath   Triage Note Pt coming by EMS after complaints with shob and bilateral feet swelling since Monday. Pt 79-80% on [place] with EMS. Placed on CPAP and sat rose to 97% BP on scene 200/100 then in [place] 146/86. No fevers noted. Stated she had some chest tightness upon arrival to ER bay   Allergies Allergies  Allergen Reactions  . Bee Venom Swelling and Other (See Comments)    "All over my body" (swelling)  . Fioricet [Butalbital-Apap-Caffeine] Nausea And Vomiting and Rash  . Ibuprofen Rash and Other (See Comments)    Severe rash  . Lamisil [Terbinafine] Rash and Other (See Comments)    Pt states this causes her to "feel funny"  . Nsaids Other (See Comments)    Per MD's orders     Level of Care/Admitting Diagnosis ED Disposition    ED Disposition Condition Comment   Admit  Hospital Area: [facility] [[id number]]  Level of Care: Progressive [102]  I expect the patient will be discharged within 24 hours: No (not a candidate for 5C-Observation unit)  Diagnosis: CHF exacerbation [facility]) [[id number]]  Admitting Physician: Shela Leff [[id number]]  Attending Physician: Shela Leff [[id number]]  PT Class (Do Not Modify): Observation [104]  PT Acc Code (Do Not Modify): Observation [10022]       B Medical/Surgery History Past Medical History:  Diagnosis Date  . Anxiety   . Arthritis   . Asthma   . Chronic diastolic CHF (congestive heart failure) ([place])   . COPD (chronic obstructive pulmonary disease) (HCC)    Uses Oxygen at night  . Depression   . Diabetes mellitus without complication ([place])   . Dyspnea    occ   . Family history of thyroid cancer   . GERD (gastroesophageal reflux disease)   . Gout   . Headache    migraines  . History of DVT of lower extremity   . History of nuclear stress test    Myoview 2/17:  Low risk stress nuclear study with a small, moderate intensity, partially reversible inferior lateral defect consistent with small prior infarct and minimal peri-infarct ischemia; EF 68 with normal wall motion  . Hypertension   . Pneumonia   . Thyroid cancer ([place]) 09/23/2016   Past Surgical History:  Procedure Laterality Date  . BUNIONECTOMY Bilateral   . CARDIAC CATHETERIZATION N/A 06/23/2015   Procedure: Right/Left Heart Cath and Coronary Angiography;  Surgeon: [name], MD;  Location: [facility];  Service: Cardiovascular;  Laterality: N/A;  . COLONOSCOPY WITH PROPOFOL N/A 12/15/2015   Procedure: COLONOSCOPY WITH PROPOFOL;  Surgeon: [name], MD;  Location: [facility];  Service: Endoscopy;  Laterality: N/A;  . THYROIDECTOMY  09/19/2016  . THYROIDECTOMY N/A 09/19/2016   Procedure: TOTAL THYROIDECTOMY;  Surgeon: [name], MD;  Location: [facility];  Service: General;  Laterality: N/A;  . TONSILLECTOMY    . TOTAL ABDOMINAL HYSTERECTOMY  07/14/10     A IV Location/Drains/Wounds Patient Lines/Drains/Airways Status   Active Line/Drains/Airways    Name:   Placement date:   Placement time:   Site:   Days:   Peripheral IV 07/04/18 Right Arm  07/04/18    0516    Arm   less than 1          Intake/Output Last 24 hours No intake or output data in the 24 hours ending 07/04/18 8416  Labs/Imaging Results for orders placed or performed during the hospital encounter of 07/04/18 (from the past 48 hour(s))  Basic metabolic panel     Status: Abnormal   Collection Time: 07/04/18  5:20 AM  Result Value Ref Range   Sodium 140 135 - 145 mmol/L   Potassium 4.4 3.5 - 5.1 mmol/L   Chloride 99 98 - 111 mmol/L   CO2 31 22 - 32 mmol/L   Glucose, Bld 120 (H) 70 - 99 mg/dL   BUN 9 6 -  20 mg/dL   Creatinine, Ser 0.56 0.44 - 1.00 mg/dL   Calcium 8.7 (L) 8.9 - 10.3 mg/dL   GFR calc non Af Amer >60 >60 mL/min   GFR calc Af Amer >60 >60 mL/min   Anion gap 10 5 - 15    Comment: Performed at [name], [address], [place], [place] [address]  CBC with Differential/Platelet     Status: Abnormal   Collection Time: 07/04/18  5:20 AM  Result Value Ref Range   WBC 12.9 (H) 4.0 - 10.5 K/uL   RBC 3.86 (L) 3.87 - 5.11 MIL/uL   Hemoglobin 10.9 (L) 12.0 - 15.0 g/dL   HCT 37.6 36.0 - 46.0 %   MCV 97.4 80.0 - 100.0 fL   MCH 28.2 26.0 - 34.0 pg   MCHC 29.0 (L) 30.0 - 36.0 g/dL   RDW 12.9 11.5 - 15.5 %   Platelets 243 150 - 400 K/uL   nRBC 0.0 0.0 - 0.2 %   Neutrophils Relative % 78 %   Neutro Abs 10.0 (H) 1.7 - 7.7 K/uL   Lymphocytes Relative 14 %   Lymphs Abs 1.9 0.7 - 4.0 K/uL   Monocytes Relative 5 %   Monocytes Absolute 0.6 0.1 - 1.0 K/uL   Eosinophils Relative 2 %   Eosinophils Absolute 0.2 0.0 - 0.5 K/uL   Basophils Relative 0 %   Basophils Absolute 0.0 0.0 - 0.1 K/uL   Immature Granulocytes 1 %   Abs Immature Granulocytes 0.14 (H) 0.00 - 0.07 K/uL    Comment: Performed at [facility] [address] [place], [place] [address]  Brain natriuretic peptide     Status: None   Collection Time: 07/04/18  5:31 AM  Result Value Ref Range   B Natriuretic Peptide 67.5 0.0 - 100.0 pg/mL    Comment: Performed at [facility] [address] [place], [place] [address]   Dg Chest Port 1 View  Result Date: 07/04/2018 CLINICAL DATA:  Shortness of breath and bilateral feet swelling EXAM: PORTABLE CHEST 1 VIEW COMPARISON:  06/17/2018 FINDINGS: Cardiomegaly and vascular pedicle widening. Diffuse interstitial opacity with Kerley lines and cephalized blood flow. No effusion or pneumothorax. Postoperative thoracic inlet IMPRESSION: CHF. Electronically Signed   By: Monte Fantasia M.D.   On: 07/04/2018 06:42    Pending Labs Unresulted Labs (From admission,  onward)   None      Vitals/Pain Today's Vitals   07/04/18 0526 07/04/18 0530 07/04/18 0545 07/04/18 0612  BP:  (!) 121/57 116/81   Pulse:  97 99   Resp:  (!) 21    Temp: 99.7 F (37.6 C)     TempSrc: Rectal     SpO2:  98% 100%   Weight:      Height:      PainSc:    10-Worst pain ever    Isolation Precautions No active isolations  Medications Medications  furosemide (LASIX) injection 40 mg (40 mg Intravenous Given 07/04/18 0614)  HYDROcodone-acetaminophen (NORCO/VICODIN) 5-325 MG per tablet 2 tablet (2 tablets Oral Given 07/04/18 0611)  clonazePAM (KLONOPIN) tablet 1 mg (1 mg Oral Given 07/04/18 0649)  albuterol (PROVENTIL) (2.5 MG/3ML) 0.083% nebulizer solution 5 mg (5 mg Nebulization Given 07/04/18 0650)  ipratropium (ATROVENT) nebulizer solution 0.5 mg (0.5 mg Nebulization Given 07/04/18 0650)    Mobility walks Low fall risk   Focused Assessments Pulmonary Assessment Handoff:  Lung sounds: Bilateral Breath Sounds: Expiratory wheezes, Diminished L Breath Sounds: Expiratory wheezes, Diminished R Breath Sounds: Diminished, Expiratory wheezes O2 Device: NRB O2 Flow Rate (L/min): 100 L/min      R Recommendations: See Admitting Provider Note  Report given to:   Additional Notes:

## 2018-07-05 ENCOUNTER — Inpatient Hospital Stay (HOSPITAL_COMMUNITY): Payer: Medicaid Other

## 2018-07-05 DIAGNOSIS — O2230 Deep phlebothrombosis in pregnancy, unspecified trimester: Secondary | ICD-10-CM

## 2018-07-05 DIAGNOSIS — N179 Acute kidney failure, unspecified: Secondary | ICD-10-CM

## 2018-07-05 DIAGNOSIS — M109 Gout, unspecified: Secondary | ICD-10-CM

## 2018-07-05 DIAGNOSIS — Y92009 Unspecified place in unspecified non-institutional (private) residence as the place of occurrence of the external cause: Secondary | ICD-10-CM

## 2018-07-05 DIAGNOSIS — G4733 Obstructive sleep apnea (adult) (pediatric): Secondary | ICD-10-CM

## 2018-07-05 DIAGNOSIS — W19XXXA Unspecified fall, initial encounter: Secondary | ICD-10-CM

## 2018-07-05 DIAGNOSIS — E1169 Type 2 diabetes mellitus with other specified complication: Secondary | ICD-10-CM

## 2018-07-05 DIAGNOSIS — J441 Chronic obstructive pulmonary disease with (acute) exacerbation: Secondary | ICD-10-CM

## 2018-07-05 DIAGNOSIS — J45901 Unspecified asthma with (acute) exacerbation: Secondary | ICD-10-CM

## 2018-07-05 DIAGNOSIS — E785 Hyperlipidemia, unspecified: Secondary | ICD-10-CM

## 2018-07-05 DIAGNOSIS — R0789 Other chest pain: Secondary | ICD-10-CM

## 2018-07-05 DIAGNOSIS — I5041 Acute combined systolic (congestive) and diastolic (congestive) heart failure: Secondary | ICD-10-CM

## 2018-07-05 LAB — CBC WITH DIFFERENTIAL/PLATELET
Abs Immature Granulocytes: 0.09 10*3/uL — ABNORMAL HIGH (ref 0.00–0.07)
BASOS PCT: 0 %
Basophils Absolute: 0 10*3/uL (ref 0.0–0.1)
Eosinophils Absolute: 0.2 10*3/uL (ref 0.0–0.5)
Eosinophils Relative: 2 %
HCT: 40.1 % (ref 36.0–46.0)
Hemoglobin: 12 g/dL (ref 12.0–15.0)
Immature Granulocytes: 1 %
LYMPHS ABS: 1.7 10*3/uL (ref 0.7–4.0)
Lymphocytes Relative: 14 %
MCH: 28.4 pg (ref 26.0–34.0)
MCHC: 29.9 g/dL — ABNORMAL LOW (ref 30.0–36.0)
MCV: 94.8 fL (ref 80.0–100.0)
Monocytes Absolute: 0.6 10*3/uL (ref 0.1–1.0)
Monocytes Relative: 5 %
NEUTROS PCT: 78 %
Neutro Abs: 9.9 10*3/uL — ABNORMAL HIGH (ref 1.7–7.7)
PLATELETS: 251 10*3/uL (ref 150–400)
RBC: 4.23 MIL/uL (ref 3.87–5.11)
RDW: 13 % (ref 11.5–15.5)
WBC: 12.6 10*3/uL — ABNORMAL HIGH (ref 4.0–10.5)
nRBC: 0 % (ref 0.0–0.2)

## 2018-07-05 LAB — BASIC METABOLIC PANEL
Anion gap: 10 (ref 5–15)
BUN: 8 mg/dL (ref 6–20)
CO2: 38 mmol/L — ABNORMAL HIGH (ref 22–32)
Calcium: 8.7 mg/dL — ABNORMAL LOW (ref 8.9–10.3)
Chloride: 92 mmol/L — ABNORMAL LOW (ref 98–111)
Creatinine, Ser: 0.61 mg/dL (ref 0.44–1.00)
GFR calc Af Amer: >60 mL/min (ref >60)
GFR calc non Af Amer: >60 mL/min (ref >60)
Glucose, Bld: 125 mg/dL — ABNORMAL HIGH (ref 70–99)
Potassium: 4 mmol/L (ref 3.5–5.1)
Sodium: 140 mmol/L (ref 135–145)

## 2018-07-05 LAB — COMPREHENSIVE METABOLIC PANEL
ALT: 19 U/L (ref 0–44)
AST: 18 U/L (ref 15–41)
Albumin: 3.9 g/dL (ref 3.5–5.0)
Alkaline Phosphatase: 88 U/L (ref 38–126)
Anion gap: 13 (ref 5–15)
BUN: 7 mg/dL (ref 6–20)
CHLORIDE: 89 mmol/L — AB (ref 98–111)
CO2: 37 mmol/L — ABNORMAL HIGH (ref 22–32)
Calcium: 8.7 mg/dL — ABNORMAL LOW (ref 8.9–10.3)
Creatinine, Ser: 0.56 mg/dL (ref 0.44–1.00)
GFR calc Af Amer: >60 mL/min (ref >60)
GFR calc non Af Amer: >60 mL/min (ref >60)
Glucose, Bld: 105 mg/dL — ABNORMAL HIGH (ref 70–99)
Potassium: 4.1 mmol/L (ref 3.5–5.1)
Sodium: 139 mmol/L (ref 135–145)
Total Bilirubin: 0.6 mg/dL (ref 0.3–1.2)
Total Protein: 7.4 g/dL (ref 6.5–8.1)

## 2018-07-05 LAB — GLUCOSE, CAPILLARY
Glucose-Capillary: 107 mg/dL — ABNORMAL HIGH (ref 70–99)
Glucose-Capillary: 129 mg/dL — ABNORMAL HIGH (ref 70–99)
Glucose-Capillary: 125 mg/dL — ABNORMAL HIGH (ref 70–99)

## 2018-07-05 LAB — MAGNESIUM: Magnesium: 1.8 mg/dL (ref 1.7–2.4)

## 2018-07-05 LAB — PHOSPHORUS: Phosphorus: 4.4 mg/dL (ref 2.5–4.6)

## 2018-07-05 MED ORDER — BISACODYL 10 MG RE SUPP: 10.0000 mg | Freq: Every day | RECTAL | Status: DC | PRN

## 2018-07-05 MED ORDER — DICLOFENAC SODIUM 1 % TD GEL
2.0000 g | Freq: Once | TRANSDERMAL | Status: AC
  Administered 2018-07-05: 2 g via TOPICAL
  Filled 2018-07-05: qty 100

## 2018-07-05 MED ORDER — SENNOSIDES-DOCUSATE SODIUM 8.6-50 MG PO TABS
1.0000 | ORAL_TABLET | Freq: Two times a day (BID) | ORAL | Status: DC
  Administered 2018-07-05 – 2018-07-10 (×11): 1 via ORAL
  Filled 2018-07-05 (×13): qty 1

## 2018-07-05 MED ORDER — POLYETHYLENE GLYCOL 3350 17 G PO PACK
17.0000 g | PACK | Freq: Two times a day (BID) | ORAL | Status: DC
  Administered 2018-07-05 – 2018-07-10 (×7): 17 g via ORAL
  Filled 2018-07-05 (×10): qty 1

## 2018-07-05 MED ORDER — IPRATROPIUM-ALBUTEROL 0.5-2.5 (3) MG/3ML IN SOLN
3.0000 mL | Freq: Three times a day (TID) | RESPIRATORY_TRACT | Status: DC
  Administered 2018-07-05 – 2018-07-09 (×10): 3 mL via RESPIRATORY_TRACT
  Filled 2018-07-05 (×11): qty 3

## 2018-07-05 MED ORDER — MAGNESIUM SULFATE 2 GM/50ML IV SOLN: 2.0000 g | Freq: Once | INTRAVENOUS | Status: DC

## 2018-07-05 MED ORDER — MAGNESIUM SULFATE IN D5W 1-5 GM/100ML-% IV SOLN
1.0000 g | Freq: Once | INTRAVENOUS | Status: AC
  Administered 2018-07-05: 1 g via INTRAVENOUS
  Filled 2018-07-05: qty 100

## 2018-07-05 NOTE — Progress Notes (Signed)
PROGRESS NOTE    Michele Owens  XVQ:[id number] DOB: Sep 18, 1960 DOA: 07/04/2018 PCP: [name], MD  Brief Narrative:  The patient is a 58 year old African-American super morbidly obese female with a past medical history significant for chronic diastolic CHF with a baseline EF of 60 to 65%, COPD Gold stage II-III on 2 L of home oxygen normally, diabetes mellitus type 2, hypertension, prior DVT, hypothyroidism, GERD, hyperlipidemia, gout other comorbidities who presented to the emergency room with a chief complaint of breath.  Recently patient was hospitalized from 3/9 to 06/19/2018 and has had a couple ED visits in between then her hospitalization was significant for acute on chronic diastolic CHF is felt her BNP was not a good indicator of her extra fluid status at that time.  She had a vascular ultrasound did not reveal a DVT of her lower swelling.  She returned to the ED with bilateral feet swelling on 323 and her O2 saturation was 70 and 80% and she was placed on BiPAP and then she was recently discharged from the hospital and been fairly well on her home health nurses did not show up for her and she admitted to dietary indiscretion by eating a lot of meat and not sticking to her Lasix normally.  EMS was called and she was placed on BiPAP to the emergency room for further evaluation and required 4 L of oxygen currently after being taken off oxygen now is back on 2 L.  She is diuresed and is currently being admitted for acute on chronic diastolic CHF  Assessment & Plan:   Principal Problem:   Acute and chronic respiratory failure with hypercapnia (HCC) Active Problems:   OSA (obstructive sleep apnea)   Dyslipidemia associated with type 2 diabetes mellitus (HCC)   Asthma with COPD with exacerbation (HCC)   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   HLD (hyperlipidemia)   Gout   DVT (deep vein thrombosis) in pregnancy   AKI (acute kidney injury) ([place])   Atypical chest pain  Fall at home, initial encounter   HNP (herniated nucleus pulposus), thoracic   CHF exacerbation (HCC)   Acute heart failure (HCC)  Acute on chronic respiratory failure with hypercapnia in the setting of type factorial causes including 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 There is felt to be a degree of underlying congestive heart failure. There is bibasilar atelectasis. No frank consolidation. No new opacity evident. Status post thyroidectomy." -Continue with diuresis with IV Lasix 40 mg twice daily -Coreg was held in the setting of acute diastolic CHF exacerbation; overtaking metoprolol succinate -Continue Lisinopril -Last ECHO in 2019 showed EF of 60-65% (improved from 45-50% previously) with Grade 1 DD (Previously had Grade 2) -Strict I's and O's and daily weights, Currently she is down 16 lbs and she is -4.137 Liters since admission -Fluid restriction -Continue diuresis -Resumed Spironolactone 100 mg p.o. daily -Continue duo nebs 4 times daily -Likely need a larger dose of Lasix at discharge -Continue supplemental oxygen via nasal cannula and wean O2 as tolerated to home dose -Continuous pulse oximetry and maintain O2 saturations greater than 90% -Repeat chest x-ray in the a.m. -Would Like to repeat ECHO but not emergent and would likely be cancelled by Cardiology Department given concern for the COVID Pandemic  -We will need home Ambulatory screen prior to discharge  to assess for O2  COPD Gold stage II-III on home oxygen -Oxygen requirements have decreased significantly she is now on 4 L and will wean to Home Dose -Continue inhalers-she does not need stepdown and was transferred to telemetry yesterday -Continue with DuoNeb 3 mils 4 times daily and transitioned to TID -C/w Anora  Ellipta -C/w Guaifenesin 100 mg po q4hprn Cough -Continue with supplemental oxygen via nasal cannula as above -Currently holding Spiriva as she is on duo nebs -Chest x-ray in the a.m.  History multinodular goiter and Thyroid cancer s/p Thyroidectomy -Continue Synthroid 224 daily -TSH in 3 weeks-low suspicion this is related to thyroid related illness  Diabetes Mellitus Type 2 -Continue metformin 500 twice bid -CBG's ranging from 94-130 -As hemoglobin A1c was 5.9 back in June 2018 I will repeat this visit -Continue to monitor blood sugars carefully and if necessary will add Novolog sensitive sliding scale insulin before meals  Constipation -Holding loperamide and Reglan as unclear which is which -She states she needs to use the bathroom once a daily "because 1 medication causes her to use the bathroom" -Started the patient on senna docusate 1 tab p.o. twice daily, MiraLAX 17 g p.o. twice daily, and added Bisacodyl 10 mg rectal suppositories as needed  GERD -C/w Pantoprazole 40 daily  Chronic pain from lumbago and prior back surgery -Continue with Hydrocodone-Acetaminophen 1 tab p.o. every 6 PRN for moderate pain  Super Morbid Obesity -Estimated body mass index is 51.32 kg/m as calculated from the following:   Height as of this encounter: 5\' 5"  (1.651 m).   Weight as of this encounter: 139.9 kg. -Weight Loss and Dietary Counseling   Leukocytosis -Patient's WBC was 12.9 and repeat was 12.6 -Likely reactive and currently afebrile  -Continue to Monitor for S/Sx of Infection -Repeat CBC in AM  Normocytic Anemia -Patient's Hgb/Hct is 10.9/37.6 -Check Anemia Panel in AM  -Continue to Monitor for S/Sx of Bleeding -Repeat CBC in AM   Depression and Anxiety -Continue Clonazepam 2 mg po TID  Hx of Gout -C/w Allopurinol 100 mg po Daily  HLD -C/w Pravastatin 40 mg po Daily   pHTN -C/w Diuresis as above  Obesity Hypoventilation Syndrome -Needs Outpatient Sleep Study  DVT  prophylaxis: Enoxaparin 40 mg sq q24h Code Status: FULL CODE Family Communication: No Family present at bedside  Disposition Plan: Anticipate D/C when Back to Baseline weight and no longer getting IV Diuresis   Consultants:   None   Procedures: None   Antimicrobials:  Anti-infectives (From admission, onward)   None     Subjective: Seen and Examined at bedside states that she is about 80% better.  Denies chest pain, lightheadedness or dizziness.  Wears oxygen intermittently at home.  No nausea or vomiting.  Felt like she needs to have a bowel movement and has not had one since the day before yesterday.  No concerns or complaints at this time.  Objective: Vitals:   07/04/18 1958 07/05/18 0108 07/05/18 0215 07/05/18 0436  BP: 112/75 132/66  131/60  Pulse: 91 86  73  Resp: 16 20  18   Temp: 98 F (36.7 C) 98.4 F (36.9 C)  98.6 F (37 C)  TempSrc: Oral Oral  Oral  SpO2: 93% 90%  94%  Weight:   (!) 139.9 kg   Height:        Intake/Output Summary (Last 24 hours) at 07/05/2018 0753 Last data filed at 07/05/2018 0615 Gross per 24 hour  Intake 1683 ml  Output 4500 ml  Net -2817 ml   Filed Weights   07/04/18 0455 07/04/18 0900 07/05/18 0215  Weight: (!) 141 kg (!) 146.1 kg (!) 139.9 kg   Examination: Physical Exam:  Constitutional: WN/WD super morbidly obese AAF NAD and appears calm but a little uncomfortable sitting in chair bedside Eyes: Lids and conjunctivae normal, sclerae anicteric  ENMT: External Ears, Nose appear normal. Grossly normal hearing. Mucous membranes are moist. Neck: Appears normal, supple, no cervical masses, normal ROM, Difficult to assess JVD due to body habitus Respiratory: Diminished to auscultation bilaterally with coarse breath sounds and crackles but no wheezing, rales, rhonchi. Normal respiratory effort and patient is not tachypenic. No accessory muscle use. Wearing Supplemental O2 via  Cardiovascular: RRR, no murmurs / rubs / gallops. S1 and S2  auscultated. 1-2+ LE extremity edema.  Abdomen: Soft, non-tender, Distended due to body habitus. No masses palpated. No appreciable hepatosplenomegaly. Bowel sounds positive x4.  GU: Deferred. Musculoskeletal: No clubbing / cyanosis of digits/nails. No joint deformity upper and lower extremities.   Skin: No rashes, lesions, ulcers on a limited skin evaluation. No induration; Warm and dry.  Neurologic: CN 2-12 grossly intact with no focal deficits.  Romberg sign and cerebellar reflexes not assessed.  Psychiatric: Normal judgment and insight. Alert and oriented x 3. Normal mood and appropriate affect.   Data Reviewed: I have personally reviewed following labs and imaging studies  CBC: Recent Labs  Lab 07/04/18 0520  WBC 12.9*  NEUTROABS 10.0*  HGB 10.9*  HCT 37.6  MCV 97.4  PLT 742   Basic Metabolic Panel: Recent Labs  Lab 07/04/18 0520 07/05/18 0337  NA 140 140  K 4.4 4.0  CL 99 92*  CO2 31 38*  GLUCOSE 120* 125*  BUN 9 8  CREATININE 0.56 0.61  CALCIUM 8.7* 8.7*   GFR: Estimated Creatinine Clearance: 109.1 mL/min (by C-G formula based on SCr of 0.61 mg/dL). Liver Function Tests: No results for input(s): AST, ALT, ALKPHOS, BILITOT, PROT, ALBUMIN in the last 168 hours. No results for input(s): LIPASE, AMYLASE in the last 168 hours. No results for input(s): AMMONIA in the last 168 hours. Coagulation Profile: No results for input(s): INR, PROTIME in the last 168 hours. Cardiac Enzymes: No results for input(s): CKTOTAL, CKMB, CKMBINDEX, TROPONINI in the last 168 hours. BNP (last 3 results) No results for input(s): PROBNP in the last 8760 hours. HbA1C: No results for input(s): HGBA1C in the last 72 hours. CBG: Recent Labs  Lab 07/04/18 0925 07/04/18 1152 07/04/18 2126 07/05/18 0621  GLUCAP 110* 130* 94 107*   Lipid Profile: No results for input(s): CHOL, HDL, LDLCALC, TRIG, CHOLHDL, LDLDIRECT in the last 72 hours. Thyroid Function Tests: No results for input(s):  TSH, T4TOTAL, FREET4, T3FREE, THYROIDAB in the last 72 hours. Anemia Panel: No results for input(s): VITAMINB12, FOLATE, FERRITIN, TIBC, IRON, RETICCTPCT in the last 72 hours. Sepsis Labs: No results for input(s): PROCALCITON, LATICACIDVEN in the last 168 hours.  No results found for this or any previous visit (from the past 240 hour(s)).   Radiology Studies: Dg Chest 2 View  Result Date: 07/05/2018 CLINICAL DATA:  Shortness of breath.  History of thyroid carcinoma EXAM: CHEST - 2 VIEW COMPARISON:  July 04, 2018 FINDINGS: There is cardiomegaly with pulmonary venous hypertension. There is bibasilar interstitial edema. There is patchy atelectasis in the lung bases. No new opacity is evident compared to 1 day prior. No adenopathy. Postoperative changes noted in the region of the thyroid. There is degenerative change in the  lower thoracic spine. IMPRESSION: Pulmonary vascular congestion with interstitial edema. There is felt to be a degree of underlying congestive heart failure. There is bibasilar atelectasis. No frank consolidation. No new opacity evident. Status post thyroidectomy. Electronically Signed   By: Lowella Grip III M.D.   On: 07/05/2018 07:48   Dg Chest Port 1 View  Result Date: 07/04/2018 CLINICAL DATA:  Shortness of breath and bilateral feet swelling EXAM: PORTABLE CHEST 1 VIEW COMPARISON:  06/17/2018 FINDINGS: Cardiomegaly and vascular pedicle widening. Diffuse interstitial opacity with Kerley lines and cephalized blood flow. No effusion or pneumothorax. Postoperative thoracic inlet IMPRESSION: CHF. Electronically Signed   By: Monte Fantasia M.D.   On: 07/04/2018 06:42   Scheduled Meds: . allopurinol  100 mg Oral Daily  . clonazePAM  2 mg Oral TID  . enoxaparin (LOVENOX) injection  40 mg Subcutaneous Q24H  . furosemide  40 mg Intravenous BID  . ipratropium-albuterol  3 mL Nebulization QID  . levothyroxine  224 mcg Oral QAC breakfast  . lisinopril  5 mg Oral Daily  .  loratadine  10 mg Oral Daily  . mouth rinse  15 mL Mouth Rinse BID  . metFORMIN  500 mg Oral BID WC  . pantoprazole  40 mg Oral Daily  . pravastatin  40 mg Oral QPM  . sodium chloride flush  3 mL Intravenous Q12H  . spironolactone  100 mg Oral Daily  . umeclidinium-vilanterol  1 puff Inhalation Daily   Continuous Infusions: . sodium chloride      LOS: 1 day    Kerney Elbe, DO [facility] PAGER is on AMION  If 7PM-7AM, please contact night-coverage [email or web address] Password [facility] 07/05/2018, 7:53 AM

## 2018-07-05 NOTE — Progress Notes (Signed)
Patient is currently in 3L nasal canula at 93% oxygen saturation.

## 2018-07-06 ENCOUNTER — Inpatient Hospital Stay (HOSPITAL_COMMUNITY): Payer: Medicaid Other

## 2018-07-06 DIAGNOSIS — M79604 Pain in right leg: Secondary | ICD-10-CM

## 2018-07-06 DIAGNOSIS — R221 Localized swelling, mass and lump, neck: Secondary | ICD-10-CM

## 2018-07-06 LAB — COMPREHENSIVE METABOLIC PANEL
ALT: 17 U/L (ref 0–44)
AST: 18 U/L (ref 15–41)
Albumin: 3.5 g/dL (ref 3.5–5.0)
Alkaline Phosphatase: 86 U/L (ref 38–126)
Anion gap: 13 (ref 5–15)
BILIRUBIN TOTAL: 0.5 mg/dL (ref 0.3–1.2)
BUN: 9 mg/dL (ref 6–20)
CO2: 37 mmol/L — ABNORMAL HIGH (ref 22–32)
Calcium: 8.4 mg/dL — ABNORMAL LOW (ref 8.9–10.3)
Chloride: 89 mmol/L — ABNORMAL LOW (ref 98–111)
Creatinine, Ser: 0.57 mg/dL (ref 0.44–1.00)
GFR calc Af Amer: >60 mL/min (ref >60)
Glucose, Bld: 105 mg/dL — ABNORMAL HIGH (ref 70–99)
Potassium: 3.8 mmol/L (ref 3.5–5.1)
Sodium: 139 mmol/L (ref 135–145)
TOTAL PROTEIN: 7.1 g/dL (ref 6.5–8.1)

## 2018-07-06 LAB — CBC WITH DIFFERENTIAL/PLATELET
Abs Immature Granulocytes: 0.08 10*3/uL — ABNORMAL HIGH (ref 0.00–0.07)
Basophils Absolute: 0 10*3/uL (ref 0.0–0.1)
Basophils Relative: 0 %
EOS PCT: 2 %
Eosinophils Absolute: 0.2 10*3/uL (ref 0.0–0.5)
HCT: 39.1 % (ref 36.0–46.0)
Hemoglobin: 11.9 g/dL — ABNORMAL LOW (ref 12.0–15.0)
Immature Granulocytes: 1 %
Lymphocytes Relative: 15 %
Lymphs Abs: 1.9 10*3/uL (ref 0.7–4.0)
MCH: 29 pg (ref 26.0–34.0)
MCHC: 30.4 g/dL (ref 30.0–36.0)
MCV: 95.4 fL (ref 80.0–100.0)
Monocytes Absolute: 0.7 10*3/uL (ref 0.1–1.0)
Monocytes Relative: 5 %
Neutro Abs: 9.6 10*3/uL — ABNORMAL HIGH (ref 1.7–7.7)
Neutrophils Relative %: 77 %
Platelets: 255 10*3/uL (ref 150–400)
RBC: 4.1 MIL/uL (ref 3.87–5.11)
RDW: 13.2 % (ref 11.5–15.5)
WBC: 12.5 10*3/uL — ABNORMAL HIGH (ref 4.0–10.5)
nRBC: 0 % (ref 0.0–0.2)

## 2018-07-06 LAB — PHOSPHORUS: PHOSPHORUS: 5 mg/dL — AB (ref 2.5–4.6)

## 2018-07-06 LAB — MAGNESIUM: MAGNESIUM: 2 mg/dL (ref 1.7–2.4)

## 2018-07-06 LAB — GLUCOSE, CAPILLARY: Glucose-Capillary: 126 mg/dL — ABNORMAL HIGH (ref 70–99)

## 2018-07-06 MED ORDER — HYDROCODONE-ACETAMINOPHEN 5-325 MG PO TABS
1.0000 | ORAL_TABLET | Freq: Four times a day (QID) | ORAL | Status: DC | PRN
  Administered 2018-07-06 (×2): 2 via ORAL
  Administered 2018-07-06: 1 via ORAL
  Administered 2018-07-07: 2 via ORAL
  Administered 2018-07-07: 1 via ORAL
  Administered 2018-07-07 – 2018-07-11 (×11): 2 via ORAL
  Filled 2018-07-06 (×7): qty 2
  Filled 2018-07-06 (×2): qty 1
  Filled 2018-07-06 (×7): qty 2

## 2018-07-06 MED ORDER — DICLOFENAC SODIUM 1 % TD GEL
2.0000 g | Freq: Four times a day (QID) | TRANSDERMAL | Status: DC
  Administered 2018-07-06 – 2018-07-10 (×9): 2 g via TOPICAL
  Filled 2018-07-06: qty 100

## 2018-07-06 NOTE — Progress Notes (Addendum)
PT Cancellation Note  Patient Details Name: Michele Owens MRN: [id number] DOB: [birth year]   Cancelled Treatment:    Reason Eval/Treat Not Completed:Other; Entered room, introduced myself to pt and explained PT role. Pt asked, "do you have an animal?" PT responded "Yes, I have a dog at home." Pt becoming visibly agitated/frustrated, yelling at PT stating, "I'm allergic, I told them not to send me anyone who has an animal; period." PT asked pt if she would like her to leave, and she responded yes.  Ellamae Sia, PT, DPT Acute Rehabilitation Services Pager [phone] Office [phone]    Willy Eddy 07/06/2018, 3:49 PM

## 2018-07-06 NOTE — Progress Notes (Signed)
PROGRESS NOTE    Michele Owens  WCB:[id number] DOB: Nov 06, 1960 DOA: 07/04/2018 PCP: [name], MD  Brief Narrative:  The patient is a 58 year old African-American super morbidly obese female with a past medical history significant for chronic diastolic CHF with a baseline EF of 60 to 65%, COPD Gold stage II-III on 2 L of home oxygen normally, diabetes mellitus type 2, hypertension, prior DVT, hypothyroidism, GERD, hyperlipidemia, gout other comorbidities who presented to the emergency room with a chief complaint of breath.  Recently patient was hospitalized from 3/9 to 06/19/2018 and has had a couple ED visits in between then her hospitalization was significant for acute on chronic diastolic CHF is felt her BNP was not a good indicator of her extra fluid status at that time.  She had a vascular ultrasound did not reveal a DVT of her lower swelling.  She returned to the ED with bilateral feet swelling on 323 and her O2 saturation was 70 and 80% and she was placed on BiPAP and then she was recently discharged from the hospital and been fairly well on her home health nurses did not show up for her and she admitted to dietary indiscretion by eating a lot of meat and not sticking to her Lasix normally.  EMS was called and she was placed on BiPAP to the emergency room for further evaluation and required 4 L of oxygen currently after being taken off oxygen now is back on 2 L.  She is diuresed and is currently being admitted for acute on chronic diastolic CHF  Ligation has been complicated by left sided neck swelling along with right-sided leg pain.  Assessment & Plan:   Principal Problem:   Acute and chronic respiratory failure with hypercapnia (HCC) Active Problems:   OSA (obstructive sleep apnea)   Dyslipidemia associated with type 2 diabetes mellitus (HCC)   Asthma with COPD with exacerbation (HCC)   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   HLD (hyperlipidemia)   Gout   DVT  (deep vein thrombosis) in pregnancy   AKI (acute kidney injury) (Home)   Atypical chest pain   Fall at home, initial encounter   HNP (herniated nucleus pulposus), thoracic   CHF exacerbation (HCC)   Acute heart failure (HCC)  Acute on chronic respiratory failure with hypercapnia in the setting of type factorial causes including 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 There is felt to be a degree of underlying congestive heart failure. There is bibasilar atelectasis. No frank consolidation. No new opacity evident. Status post thyroidectomy." -Continue with diuresis with IV Lasix 40 mg twice daily -Coreg was held in the setting of acute diastolic CHF exacerbation; overtaking metoprolol succinate -Continue Lisinopril -Last ECHO in 2019 showed EF of 60-65% (improved from 45-50% previously) with Grade 1 DD (Previously had Grade 2) -Strict I's and O's and daily weights, Currently she is down 12 lbs and she is -5.928 Liters since admission -Fluid restriction to 1500 mL -Continue diuresis as above -Resumed Spironolactone 100 mg p.o. daily -Continue duo nebs 4 times daily -Likely need a larger dose of Lasix at discharge as she was only taking 20 mg  -Continue supplemental oxygen via nasal cannula and wean O2 as tolerated to home dose -Continuous pulse oximetry and maintain O2 saturations greater than 90% -Repeat chest x-ray this AM showed "  Pulmonary vascular congestion with interstitial edema. There is felt to be a degree of underlying congestive heart failure. There is bibasilar atelectasis. No frank consolidation. No new opacity evident. Status post thyroidectomy." -Would Like to repeat ECHO but not emergent and would likely be cancelled by Cardiology Department given concern for the  COVID Pandemic  -We will need home Ambulatory screen prior to discharge to assess for O2  COPD Gold stage II-III on home oxygen -Oxygen requirements have decreased significantly she is now on 4 L and will wean to Home Dose -Continue inhalers-she does not need stepdown and was transferred to telemetry yesterday -Continue with DuoNeb 3 mils 4 times daily and transitioned to TID -C/w Anora Ellipta -C/w Guaifenesin 100 mg po q4hprn Cough -Continue with supplemental oxygen via nasal cannula as above -Currently holding Spiriva as she is on duo nebs -Chest x-ray as above   History multinodular goiter and Thyroid cancer s/p Thyroidectomy -Continue Synthroid 224 daily -TSH in 3 weeks-low suspicion this is related to thyroid related illness  Right Leg Pain/Hip Pain -Pain control as below -Added Diclofenac Gel -Obtain X-Ray of the Hip and Femur -PT/OT to evaluate and Treat   Left Sided Neck Swelling -Has a Hx of of Thyroidectomy -Obtain Neck X-Ray and U/S -U/S of the neck showed Unremarkable neck ultrasound demonstrating no evidence of lymphadenopathy or abnormal soft tissue mass.  Diabetes Mellitus Type 2 -Continue Metformin 500 twice bid -CBG's ranging from 94-130 -As hemoglobin A1c was 5.9 back in June 2018 I will repeat this visit -Continue to monitor blood sugars carefully and if necessary will add Novolog sensitive sliding scale insulin before meals  Constipation, improved  -Holding loperamide and Reglan as unclear which is which -She states she needs to use the bathroom once a daily "because 1 medication causes her to use the bathroom" -Started the patient on senna docusate 1 tab p.o. twice daily, MiraLAX 17 g p.o. twice daily, and added Bisacodyl 10 mg rectal suppositories as needed  GERD -C/w Pantoprazole 40 daily  Chronic pain from lumbago and prior back surgery -Continue with Hydrocodone-Acetaminophen but increased to 1-2 tab p.o. every 6 PRN for moderate pain -Added  Diclofenac Gel as above   Super Morbid Obesity -Estimated body mass index is 52.04 kg/m as calculated from the following:   Height as of this encounter: 5\' 5"  (1.651 m).   Weight as of this encounter: 141.8 kg. -Weight Loss and Dietary Counseling   Leukocytosis -Patient's WBC was 12.9 on admission and repeat was 12.5 -Likely reactive and currently afebrile; ? Related to Pain -Continue to Monitor for S/Sx of Infection -Repeat CBC in AM  Normocytic Anemia -Patient's Hgb/Hct was 10.9/37.6 on admission and repeat was 12.0/40.1 and now 11.9/39.1 -Check Anemia Panel in AM  -Continue to Monitor for S/Sx of Bleeding -Repeat CBC in AM   Depression and Anxiety -Continue Clonazepam 2 mg po TID  Hx of Gout -C/w Allopurinol 100 mg po Daily  HLD -C/w Pravastatin 40 mg po Daily   pHTN -C/w Diuresis as above  Obesity Hypoventilation Syndrome -Needs Outpatient Sleep Study -Will consider placing on CPAP qHS  Hyperphosphatemia -Patient's phosphorus level went from 4.4 is not 5.0 -Continue to monitor and repeat phosphorus level in the a.m.  DVT prophylaxis: Enoxaparin 40 mg sq q24h Code Status: FULL CODE Family Communication: No Family present at bedside  Disposition Plan: Anticipate D/C when Back to Baseline weight and no longer getting IV Diuresis   Consultants:   None   Procedures: U/S  of the Neck   Antimicrobials:  Anti-infectives (From admission, onward)   None     Subjective: Seen and Examined at bedside and she was sitting in the chair bedside and did not understand about fluid restriction so I educated her and now she states that she will do better.  Denied chest pain, lightheadedness or dizziness and was still mildly short of breath but is improved significantly.  Was complaining of some left-sided neck swelling as well as some right leg pain.  No other concerns or complaints at this time.  Objective: Vitals:   07/06/18 0041 07/06/18 0336 07/06/18 0340 07/06/18  0842  BP: 128/75  127/82   Pulse: 95  91   Resp: (!) 22  20   Temp: 98.3 F (36.8 C)  97.8 F (36.6 C)   TempSrc: Oral  Oral   SpO2: (!) 89%  90% 91%  Weight:  (!) 141.8 kg    Height:        Intake/Output Summary (Last 24 hours) at 07/06/2018 1329 Last data filed at 07/06/2018 1200 Gross per 24 hour  Intake 960 ml  Output 3351 ml  Net -2391 ml   Filed Weights   07/04/18 0900 07/05/18 0215 07/06/18 0336  Weight: (!) 146.1 kg (!) 139.9 kg (!) 141.8 kg   Examination: Physical Exam:  Constitutional: Well-nourished, well-developed super morbidly obese African-American female currently no acute distress appears calm sitting the bedside was uncomfortable complaining of some right leg pain and some left-sided neckswelling. Eyes: Lids and conjunctive are normal.  Sclera anicteric ENMT: External Ears and Nose appear normal.  Mucous membranes are moist Neck: Has some swelling in the Left Neck more pronounced than the Right and ? Adipose tissue. Difficult to assess JVD Respiratory: Diminished auscultation bilaterally slightly with coarse breath sounds in has some crackles but no appreciable wheezing, rales, rhonchi.  Patient not tachypneic or using any accessory muscles to breathe and is wearing supplemental oxygen via nasal cannula still Cardiovascular: Regular rate and rhythm.  No appreciable murmurs, rubs, gallops but heart sounds are little distant.  Has 1+ lower extremity edema still. Abdomen: Soft, nontender, distended secondary body habitus.  Bowel sounds present GU: Deferred Musculoskeletal:   No contractures or cyanosis.  No joint deformities in the upper lower extremities Skin: Skin is warm and dry no appreciable rashes or lesions on to skin evaluation Neurologic: Cranial nerves II through XII grossly intact no appreciable focal deficits.  Romberg sign cerebellar reflexes were not assessed Psychiatric: Normal judgment and insight.  Patient is awake, alert, and oriented x3.  Pleasant  mood and affect  Data Reviewed: I have personally reviewed following labs and imaging studies  CBC: Recent Labs  Lab 07/04/18 0520 07/05/18 0807 07/06/18 0409  WBC 12.9* 12.6* 12.5*  NEUTROABS 10.0* 9.9* 9.6*  HGB 10.9* 12.0 11.9*  HCT 37.6 40.1 39.1  MCV 97.4 94.8 95.4  PLT 243 251 191   Basic Metabolic Panel: Recent Labs  Lab 07/04/18 0520 07/05/18 0337 07/05/18 0807 07/06/18 0409  NA 140 140 139 139  K 4.4 4.0 4.1 3.8  CL 99 92* 89* 89*  CO2 31 38* 37* 37*  GLUCOSE 120* 125* 105* 105*  BUN 9 8 7 9   CREATININE 0.56 0.61 0.56 0.57  CALCIUM 8.7* 8.7* 8.7* 8.4*  MG  --   --  1.8 2.0  PHOS  --   --  4.4 5.0*   GFR: Estimated Creatinine Clearance: 110 mL/min (by C-G formula based on SCr of 0.57  mg/dL). Liver Function Tests: Recent Labs  Lab 07/05/18 0807 07/06/18 0409  AST 18 18  ALT 19 17  ALKPHOS 88 86  BILITOT 0.6 0.5  PROT 7.4 7.1  ALBUMIN 3.9 3.5   No results for input(s): LIPASE, AMYLASE in the last 168 hours. No results for input(s): AMMONIA in the last 168 hours. Coagulation Profile: No results for input(s): INR, PROTIME in the last 168 hours. Cardiac Enzymes: No results for input(s): CKTOTAL, CKMB, CKMBINDEX, TROPONINI in the last 168 hours. BNP (last 3 results) No results for input(s): PROBNP in the last 8760 hours. HbA1C: No results for input(s): HGBA1C in the last 72 hours. CBG: Recent Labs  Lab 07/04/18 1152 07/04/18 2126 07/05/18 0621 07/05/18 1218 07/05/18 1708  GLUCAP 130* 94 107* 129* 125*   Lipid Profile: No results for input(s): CHOL, HDL, LDLCALC, TRIG, CHOLHDL, LDLDIRECT in the last 72 hours. Thyroid Function Tests: No results for input(s): TSH, T4TOTAL, FREET4, T3FREE, THYROIDAB in the last 72 hours. Anemia Panel: No results for input(s): VITAMINB12, FOLATE, FERRITIN, TIBC, IRON, RETICCTPCT in the last 72 hours. Sepsis Labs: No results for input(s): PROCALCITON, LATICACIDVEN in the last 168 hours.  No results found for  this or any previous visit (from the past 240 hour(s)).   Radiology Studies: Dg Chest 2 View  Result Date: 07/05/2018 CLINICAL DATA:  Shortness of breath.  History of thyroid carcinoma EXAM: CHEST - 2 VIEW COMPARISON:  July 04, 2018 FINDINGS: There is cardiomegaly with pulmonary venous hypertension. There is bibasilar interstitial edema. There is patchy atelectasis in the lung bases. No new opacity is evident compared to 1 day prior. No adenopathy. Postoperative changes noted in the region of the thyroid. There is degenerative change in the lower thoracic spine. IMPRESSION: Pulmonary vascular congestion with interstitial edema. There is felt to be a degree of underlying congestive heart failure. There is bibasilar atelectasis. No frank consolidation. No new opacity evident. Status post thyroidectomy. Electronically Signed   By: Lowella Grip III M.D.   On: 07/05/2018 07:48   US Soft Tissue Head & Neck (non-thyroid)  Result Date: 07/06/2018 CLINICAL DATA:  Swelling of left neck and history of prior thyroidectomy for thyroid carcinoma. EXAM: ULTRASOUND OF HEAD/NECK SOFT TISSUES TECHNIQUE: Ultrasound examination of the head and neck soft tissues was performed in the area of clinical concern. COMPARISON:  Neck ultrasound on 12/18/2016 FINDINGS: Ultrasound demonstrates no soft tissue mass, lymphadenopathy or abnormal soft tissue in the neck. No abnormal fluid collections identified. No findings suspicious for recurrent thyroid carcinoma. IMPRESSION: Unremarkable neck ultrasound demonstrating no evidence of lymphadenopathy or abnormal soft tissue mass. Electronically Signed   By: Aletta Edouard M.D.   On: 07/06/2018 11:00   Dg Chest Port 1 View  Result Date: 07/06/2018 CLINICAL DATA:  Shortness of breath EXAM: PORTABLE CHEST 1 VIEW COMPARISON:  07/05/2018 FINDINGS: Lingular/left lower lobe scarring/atelectasis. Mild right basilar atelectasis. No focal consolidation. No frank interstitial edema. No  pleural effusion or pneumothorax. The heart is normal in size. Degenerative changes of the visualized thoracolumbar spine. IMPRESSION: Lingular and bilateral lower lobe scarring/atelectasis. No evidence of acute cardiopulmonary disease. Electronically Signed   By: Julian Hy M.D.   On: 07/06/2018 07:38   Scheduled Meds:  allopurinol  100 mg Oral Daily   clonazePAM  2 mg Oral TID   diclofenac sodium  2 g Topical QID   enoxaparin (LOVENOX) injection  40 mg Subcutaneous Q24H   furosemide  40 mg Intravenous BID   ipratropium-albuterol  3 mL  Nebulization TID   levothyroxine  224 mcg Oral QAC breakfast   lisinopril  5 mg Oral Daily   loratadine  10 mg Oral Daily   mouth rinse  15 mL Mouth Rinse BID   metFORMIN  500 mg Oral BID WC   pantoprazole  40 mg Oral Daily   polyethylene glycol  17 g Oral BID   pravastatin  40 mg Oral QPM   senna-docusate  1 tablet Oral BID   sodium chloride flush  3 mL Intravenous Q12H   spironolactone  100 mg Oral Daily   umeclidinium-vilanterol  1 puff Inhalation Daily   Continuous Infusions:  sodium chloride 250 mL (07/05/18 1057)    LOS: 2 days    Kerney Elbe, DO [facility] PAGER is on AMION  If 7PM-7AM, please contact night-coverage [email or web address] Password [email or web address] 07/06/2018, 1:29 PM

## 2018-07-07 ENCOUNTER — Inpatient Hospital Stay (HOSPITAL_COMMUNITY): Payer: Medicaid Other

## 2018-07-07 LAB — CBC WITH DIFFERENTIAL/PLATELET
Abs Immature Granulocytes: 0.08 10*3/uL — ABNORMAL HIGH (ref 0.00–0.07)
BASOS ABS: 0.1 10*3/uL (ref 0.0–0.1)
Basophils Relative: 0 %
Eosinophils Absolute: 0.2 10*3/uL (ref 0.0–0.5)
Eosinophils Relative: 2 %
HCT: 38.8 % (ref 36.0–46.0)
Hemoglobin: 11.8 g/dL — ABNORMAL LOW (ref 12.0–15.0)
IMMATURE GRANULOCYTES: 1 %
Lymphocytes Relative: 17 %
Lymphs Abs: 1.9 10*3/uL (ref 0.7–4.0)
MCH: 29 pg (ref 26.0–34.0)
MCHC: 30.4 g/dL (ref 30.0–36.0)
MCV: 95.3 fL (ref 80.0–100.0)
Monocytes Absolute: 0.8 10*3/uL (ref 0.1–1.0)
Monocytes Relative: 7 %
NEUTROS ABS: 8.4 10*3/uL — AB (ref 1.7–7.7)
NEUTROS PCT: 73 %
Platelets: 260 10*3/uL (ref 150–400)
RBC: 4.07 MIL/uL (ref 3.87–5.11)
RDW: 13.2 % (ref 11.5–15.5)
WBC: 11.4 10*3/uL — ABNORMAL HIGH (ref 4.0–10.5)
nRBC: 0 % (ref 0.0–0.2)

## 2018-07-07 LAB — COMPREHENSIVE METABOLIC PANEL
ALBUMIN: 3.7 g/dL (ref 3.5–5.0)
ALT: 16 U/L (ref 0–44)
AST: 17 U/L (ref 15–41)
Alkaline Phosphatase: 85 U/L (ref 38–126)
Anion gap: 14 (ref 5–15)
BUN: 11 mg/dL (ref 6–20)
CO2: 33 mmol/L — ABNORMAL HIGH (ref 22–32)
CREATININE: 0.59 mg/dL (ref 0.44–1.00)
Calcium: 8.3 mg/dL — ABNORMAL LOW (ref 8.9–10.3)
Chloride: 91 mmol/L — ABNORMAL LOW (ref 98–111)
GFR calc Af Amer: >60 mL/min (ref >60)
GFR calc non Af Amer: >60 mL/min (ref >60)
GLUCOSE: 104 mg/dL — AB (ref 70–99)
Potassium: 3.7 mmol/L (ref 3.5–5.1)
Sodium: 138 mmol/L (ref 135–145)
Total Bilirubin: 0.5 mg/dL (ref 0.3–1.2)
Total Protein: 7 g/dL (ref 6.5–8.1)

## 2018-07-07 LAB — EXPECTORATED SPUTUM ASSESSMENT W GRAM STAIN, RFLX TO RESP C

## 2018-07-07 LAB — GLUCOSE, CAPILLARY
Glucose-Capillary: 149 mg/dL — ABNORMAL HIGH (ref 70–99)
Glucose-Capillary: 121 mg/dL — ABNORMAL HIGH (ref 70–99)
Glucose-Capillary: 177 mg/dL — ABNORMAL HIGH (ref 70–99)
Glucose-Capillary: 129 mg/dL — ABNORMAL HIGH (ref 70–99)

## 2018-07-07 LAB — PHOSPHORUS: Phosphorus: 5.5 mg/dL — ABNORMAL HIGH (ref 2.5–4.6)

## 2018-07-07 LAB — MAGNESIUM: Magnesium: 1.9 mg/dL (ref 1.7–2.4)

## 2018-07-07 MED ORDER — FUROSEMIDE 10 MG/ML IJ SOLN
60.0000 mg | Freq: Two times a day (BID) | INTRAMUSCULAR | Status: DC
  Administered 2018-07-07 – 2018-07-08 (×2): 60 mg via INTRAVENOUS
  Filled 2018-07-07: qty 6

## 2018-07-07 MED ORDER — CARVEDILOL 6.25 MG PO TABS
6.2500 mg | ORAL_TABLET | Freq: Two times a day (BID) | ORAL | Status: DC
  Administered 2018-07-07 – 2018-07-11 (×8): 6.25 mg via ORAL
  Filled 2018-07-07 (×8): qty 1

## 2018-07-07 MED ORDER — MONTELUKAST SODIUM 10 MG PO TABS
10.0000 mg | ORAL_TABLET | Freq: Every day | ORAL | Status: DC
  Administered 2018-07-07 – 2018-07-10 (×4): 10 mg via ORAL
  Filled 2018-07-07 (×4): qty 1

## 2018-07-07 MED ORDER — GUAIFENESIN ER 600 MG PO TB12
1200.0000 mg | ORAL_TABLET | Freq: Two times a day (BID) | ORAL | Status: DC
  Administered 2018-07-07 – 2018-07-11 (×9): 1200 mg via ORAL
  Filled 2018-07-07 (×9): qty 2

## 2018-07-07 MED ORDER — FUROSEMIDE 10 MG/ML IJ SOLN
20.0000 mg | Freq: Once | INTRAMUSCULAR | Status: AC
  Administered 2018-07-07: 20 mg via INTRAVENOUS

## 2018-07-07 NOTE — Plan of Care (Signed)
  Problem: Education: Goal: Knowledge of General Education information will improve Description Including pain rating scale, medication(s)/side effects and non-pharmacologic comfort measures Outcome: Progressing   Problem: Health Behavior/Discharge Planning: Goal: Ability to manage health-related needs will improve Outcome: Progressing   

## 2018-07-07 NOTE — Evaluation (Signed)
Physical Therapy Evaluation Patient Details Name: Michele Owens MRN: [id number] DOB: 1960/09/18 Today's Date: 07/07/2018   History of Present Illness  58 y.o. female with history of diastolic CHF, COPD, diabetes mellitus, gout, morbid obesity, and recent fall on 06/16/2018 presenting with SOB and worsening hypoxia.  Clinical Impression  Pt admitted with above diagnosis. Pt currently with functional limitations due to the deficits listed below (see PT Problem List). Pt would not get OOB. Bed level eval performed.  Was min assist with OT earlier and has walked with nursing with min assist.  Pt states she is going home as soon as she can and does not want HH.  Pt states she will use her cane or Hoverround.  Recommend rollator if pt will agree. Will follow acutely and progress as pt allows.   Pt will benefit from skilled PT to increase their independence and safety with mobility to allow discharge to the venue listed below.    SATURATION QUALIFICATIONS: (This note is used to comply with regulatory documentation for home oxygen)  Patient Saturations on Room Air at Rest = 85%  Patient Saturations on Room Air while Ambulating = NT due to desat on RA at rest  Patient Saturations on 3 Liters of oxygen while Ambulating = Did not ambulate but needed 3L to maintain sats >90%.   Please briefly explain why patient needs home oxygen:Will need home O2 as pt desats at rest on RA.   Follow Up Recommendations (Pt declines HHPT due to Corona virus)    Equipment Recommendations  Other (comment)(Rollator if pt agrees)    Recommendations for Other Services       Precautions / Restrictions Precautions Precautions: Fall Precaution Comments: fall on 3/8  Restrictions Weight Bearing Restrictions: No      Mobility  Bed Mobility Overal bed mobility: Needs Assistance Bed Mobility: Rolling Rolling: Supervision         General bed mobility comments: Refused to get OOB even with max encouragement.  Pt  states she has been back and forth to bathroom. Pt asked PT to rub lotion on her legs therefore PT did so.  PT thought pt may get up with PT but ultimately she refused due to a headache.   Transfers                    Ambulation/Gait                Stairs            Wheelchair Mobility    Modified Rankin (Stroke Patients Only)       Balance                                             Pertinent Vitals/Pain Pain Assessment: Faces Faces Pain Scale: Hurts whole lot Pain Location: head Pain Descriptors / Indicators: Grimacing;Guarding;Aching;Sore Pain Intervention(s): Limited activity within patient's tolerance;Monitored during session;Repositioned;Patient requesting pain meds-RN notified    Home Living Family/patient expects to be discharged to:: Private residence Living Arrangements: Alone Available Help at Discharge: Family;Available PRN/intermittently(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 Daughter checks in) Type of Home: Apartment Home Access: Level entry     Home Layout: One level Home Equipment: Shower seat;Cane - single point;Tub bench;Bedside commode;Electric scooter Additional Comments: home O2 2L    Prior Function Level of Independence: Needs assistance   Gait / Transfers Assistance  Needed: Cane for mobility around home  ADL's / Homemaking Assistance Needed: Reports daughter assists with bathing. No longer as aide per pt  Comments: Pt reports she suses [employer] for transfportation and does her own grocery shopping.  She reports she ambulates in grocery store with her cane, or uses scooter if she is fatigued      Hand Dominance   Dominant Hand: Right    Extremity/Trunk Assessment   Upper Extremity Assessment Upper Extremity Assessment: Defer to OT evaluation    Lower Extremity Assessment Lower Extremity Assessment: Generalized weakness    Cervical / Trunk Assessment Cervical / Trunk Assessment: Other exceptions Cervical /  Trunk Exceptions: Increased body habitus  Communication   Communication: No difficulties  Cognition Arousal/Alertness: Awake/alert Behavior During Therapy: Flat affect;Agitated(Fluctuating between flat affect and frustration) Overall Cognitive Status: No family/caregiver present to determine baseline cognitive functioning                                 General Comments: Feel pt is at baseline. However, quickly becomes frustrated and has difficult time maintaining conversation and attending conversation      General Comments General comments (skin integrity, edema, etc.): SpO2 on 3L at rest with sats 90% and >.  Desats with any less O2.     Exercises General Exercises - Lower Extremity Heel Slides: AROM;Both;10 reps;Supine   Assessment/Plan    PT Assessment Patient needs continued PT services  PT Problem List Decreased strength;Decreased range of motion;Decreased activity tolerance;Decreased balance;Decreased mobility;Pain;Decreased safety awareness       PT Treatment Interventions DME instruction;Gait training;Functional mobility training;Therapeutic activities;Therapeutic exercise;Balance training;Patient/family education    PT Goals (Current goals can be found in the Care Plan section)  Acute Rehab PT Goals Patient Stated Goal: I need Migraine medicine PT Goal Formulation: With patient Time For Goal Achievement: 07/21/18 Potential to Achieve Goals: Fair    Frequency Min 3X/week   Barriers to discharge Decreased caregiver support      Co-evaluation               AM-PAC PT "6 Clicks" Mobility  Outcome Measure Help needed turning from your back to your side while in a flat bed without using bedrails?: A Little Help needed moving from lying on your back to sitting on the side of a flat bed without using bedrails?: A Little Help needed moving to and from a bed to a chair (including a wheelchair)?: A Little Help needed standing up from a chair using  your arms (e.g., wheelchair or bedside chair)?: A Little Help needed to walk in hospital room?: A Little Help needed climbing 3-5 steps with a railing? : A Lot 6 Click Score: 17    End of Session Equipment Utilized During Treatment: Oxygen Activity Tolerance: Patient limited by pain;Patient limited by fatigue Patient left: with call bell/phone within reach;in bed Nurse Communication: Mobility status;Precautions PT Visit Diagnosis: Unsteadiness on feet (R26.81);Other abnormalities of gait and mobility (R26.89);Muscle weakness (generalized) (M62.81);Difficulty in walking, not elsewhere classified (R26.2);History of falling (Z91.81);Pain Pain - part of body: (head)    Time: 6834-1962 PT Time Calculation (min) (ACUTE ONLY): 18 min   Charges:   PT Evaluation $PT Eval Moderate Complexity: [place] Pager:  [phone]  Office:  [place] 07/07/2018, 4:22 PM

## 2018-07-07 NOTE — Evaluation (Signed)
Occupational Therapy Evaluation Patient Details Name: Michele Owens MRN: [id number] DOB: 1960-06-03 Today's Date: 07/07/2018    History of Present Illness 58 y.o. female with history of diastolic CHF, COPD, diabetes mellitus, gout, morbid obesity, and recent fall on 06/16/2018 presenting with SOB and worsening hypoxia.     Clinical Impression   PTA, pt was living alone and reports her daughter would assist with bathing/dressing "sometimes" and that she "lost aide days". Pt currently performing at baseline function requiring Min-Mod A for LB ADLs, Mod A for toilet hygiene, and supervision for functional mobility with use for [facility]. SpO2 dropping to 85% on 2L with activity and provided cues for purse lip breathing; SpO2 elevating quickly to 90s. Recommend dc home once medically stable per physician. All acute OT needs met and will sign off.     Follow Up Recommendations  No OT follow up;Supervision/Assistance - 24 hour(HH aide)    Equipment Recommendations  None recommended by OT    Recommendations for Other Services       Precautions / Restrictions Precautions Precautions: Fall Restrictions Weight Bearing Restrictions: No      Mobility Bed Mobility               General bed mobility comments: At [facility] upon arrival  Transfers Overall transfer level: Needs assistance Equipment used: Straight cane Transfers: Sit to/from Stand Sit to Stand: Supervision         General transfer comment: supervision for safety    Balance Overall balance assessment: Needs assistance Sitting-balance support: Feet supported;No upper extremity supported Sitting balance-Leahy Scale: Fair     Standing balance support: During functional activity;Single extremity supported Standing balance-Leahy Scale: Fair                             ADL either performed or assessed with clinical judgement   ADL Overall ADL's : Needs assistance/impaired Eating/Feeding: Independent;Sitting    Grooming: Oral care;Wash/dry hands;Wash/dry face;Supervision/safety;Set up;Standing   Upper Body Bathing: Supervision/ safety;Set up;Sitting   Lower Body Bathing: Minimal assistance;Sit to/from stand   Upper Body Dressing : Supervision/safety;Set up;Sitting   Lower Body Dressing: Moderate assistance;Sit to/from stand   Toilet Transfer: Supervision/safety;Ambulation;BSC   Toileting- Water quality scientist and Hygiene: Sit to/from stand;Moderate assistance Toileting - Clothing Manipulation Details (indicate cue type and reason): Pt reporting she cannot clean her self after BM due to limited ROM at left shoulder and IV in right arm. Pt able to clean front peri area.      Functional mobility during ADLs: Supervision/safety;Cane General ADL Comments: Pt performing at baseline     Vision Baseline Vision/History: Wears glasses Wears Glasses: At all times Patient Visual Report: No change from baseline       Perception     Praxis      Pertinent Vitals/Pain Pain Assessment: Faces Faces Pain Scale: Hurts little more Pain Location: L shoulder and back  Pain Descriptors / Indicators: Grimacing;Guarding;Aching;Sore Pain Intervention(s): Monitored during session;Repositioned     Hand Dominance Right   Extremity/Trunk Assessment Upper Extremity Assessment Upper Extremity Assessment: LUE deficits/detail LUE Deficits / Details: left shoulder pain and decreased ROM. Baseline   Lower Extremity Assessment Lower Extremity Assessment: Generalized weakness   Cervical / Trunk Assessment Cervical / Trunk Assessment: Other exceptions Cervical / Trunk Exceptions: Increased body habitus   Communication Communication Communication: No difficulties   Cognition Arousal/Alertness: Awake/alert Behavior During Therapy: Flat affect;Agitated(Fluctuating between flat affect and frustration) Overall Cognitive Status: No family/caregiver present  to determine baseline cognitive functioning                                  General Comments: Feel pt is at baseline. However, quickly becomes frustrated and has difficult time maintaining conversation and attending conversation   General Comments  SpO2 85% on 2L. 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 Daughter checks in) Type of Home: Apartment Home Access: Level entry     Home Layout: One level     Bathroom Shower/Tub: Teacher, early years/pre: Standard     Home Equipment: Shower seat;Cane - single point;Tub bench;Bedside commode   Additional Comments: home O2 2L      Prior Functioning/Environment Level of Independence: Needs assistance  Gait / Transfers Assistance Needed: Kasandra Knudsen for mobility around home ADL's / Homemaking Assistance Needed: Reports daughter assists with bathing. No longer as aide per pt            OT Problem List: Decreased strength;Decreased range of motion;Decreased activity tolerance;Impaired balance (sitting and/or standing);Decreased knowledge of use of DME or AE;Decreased knowledge of precautions;Pain      OT Treatment/Interventions:      OT Goals(Current goals can be found in the care plan section) Acute Rehab OT Goals Patient Stated Goal: "Get my calm down meds because I am getting frustrated" OT Goal Formulation: All assessment and education complete, DC therapy  OT Frequency:     Barriers to D/C:            Co-evaluation              AM-PAC OT "6 Clicks" Daily Activity     Outcome Measure Help from another person eating meals?: None Help from another person taking care of personal grooming?: None Help from another person toileting, which includes using toliet, bedpan, or urinal?: A Lot Help from another person bathing  (including washing, rinsing, drying)?: A Little Help from another person to put on and taking off regular upper body clothing?: A Little Help from another person to put on and taking off regular lower body clothing?: A Lot 6 Click Score: 18   End of Session Equipment Utilized During Treatment: Oxygen Nurse Communication: Mobility status;Other (comment)(BM and void 222m)  Activity Tolerance: Patient tolerated treatment well Patient left: in chair;with call bell/phone within reach  OT Visit Diagnosis: Unsteadiness on feet (R26.81);Other abnormalities of gait and mobility (R26.89);Muscle weakness (generalized) (M62.81);Pain Pain - Right/Left: Left Pain - part of body: Shoulder                Time: 03235-5732OT Time Calculation (min): 19 min Charges:  OT General Charges $OT Visit: 1 Visit OT Evaluation $OT Eval Moderate Complexity: 1[place] OTR/L Acute Rehab Pager: [phone]: 3[place]3/29/2020, 9:27 AM

## 2018-07-07 NOTE — Progress Notes (Signed)
PROGRESS NOTE    Michele Owens  RSW:[id number] DOB: September 24, 1960 DOA: 07/04/2018 PCP: [name], MD  Brief Narrative:  The patient is a 58 year old African-American super morbidly obese female with a past medical history significant for chronic diastolic CHF with a baseline EF of 60 to 65%, COPD Gold stage II-III on 2 L of home oxygen normally, diabetes mellitus type 2, hypertension, prior DVT, hypothyroidism, GERD, hyperlipidemia, gout other comorbidities who presented to the emergency room with a chief complaint of breath.  Recently patient was hospitalized from 3/9 to 06/19/2018 and has had a couple ED visits in between then her hospitalization was significant for acute on chronic diastolic CHF is felt her BNP was not a good indicator of her extra fluid status at that time.  She had a vascular ultrasound did not reveal a DVT of her lower swelling.  She returned to the ED with bilateral feet swelling on 323 and her O2 saturation was 70 and 80% and she was placed on BiPAP and then she was recently discharged from the hospital and been fairly well on her home health nurses did not show up for her and she admitted to dietary indiscretion by eating a lot of meat and not sticking to her Lasix normally.  EMS was called and she was placed on BiPAP to the emergency room for further evaluation and required 4 L of oxygen currently after being taken off oxygen now is back on 2 L.  She is being diuresed and is currently being admitted for acute on chronic diastolic CHF  Hospitalization has been complicated by left sided neck swelling along with right-sided leg pain. Still extremely volume overloaded so will increase diuresis today.   Assessment & Plan:   Principal Problem:   Acute and chronic respiratory failure with hypercapnia (HCC) Active Problems:   OSA (obstructive sleep apnea)   Dyslipidemia associated with type 2 diabetes mellitus (HCC)   Asthma with COPD with exacerbation (HCC)   Multinodular  goiter w/ dominant right thyroid nodule   HLD (hyperlipidemia)   Gout   DVT (deep vein thrombosis) in pregnancy   AKI (acute kidney injury) ([place])   Atypical chest pain   Fall at home, initial encounter   HNP (herniated nucleus pulposus), thoracic   CHF exacerbation (HCC)   Acute heart failure (HCC)  Acute on chronic respiratory failure with hypercapnia in the setting of type factorial causes including Chronic Combined Systolic and Diastolic CHF Exacerbation along with underlying COPD -Multifactorial causes of respiratory failure and has been weaned off of BiPAP now  -CXR this AM showed "Stable cardiomediastinal silhouette. No pneumothorax is noted. Stable bibasilar opacities are noted concerning for atelectasis or possibly infiltrates. No pleural effusion is noted. Bony thorax is unremarkable." -Continue with diuresis but increase IV Lasix 40 mg twice daily to 60 mg po BID -Coreg was held in the setting of acute diastolic CHF exacerbation but will resume today; She is not taking metoprolol succinate -Continue Lisinopril -Last ECHO in 2019 showed EF of 60-65% (improved from 45-50% previously) with Grade 1 DD (Previously had Grade 2) -Strict I's and O's and daily weights, Currently she is down 12 lbs and she is -6.303 Liters since admission -Fluid restriction to 1500 mL -Continue diuresis as above -Resumed Spironolactone 100 mg p.o. daily and will continue -Continue DuoNeb q6h and changed to TID  -Likely need a larger dose of Lasix at discharge as she was only taking 20 mg po Daily  -Continue supplemental oxygen via nasal cannula  and wean O2 as tolerated to home dose -Continuous pulse oximetry and maintain O2 saturations greater than 90%" -Would Like to repeat ECHO but not emergent and would likely be cancelled by Cardiology Department given concern for the [place]; Will discuss with Cardiology this AM -We will need home Ambulatory screen prior to discharge to assess for O2  COPD  Gold stage II-III on home oxygen -Oxygen requirements have decreased significantly she is now on 4 L and will wean to Home o2 -Continue inhalers-she does not need stepdown and was transferred to telemetry yesterday -Continue with DuoNeb 3 mils q6h and transitioned to TID -C/w Anora Ellipta -Changed Guaifenesin 100 mg po q4hprn Cough to Guaifenesin 1200 mg po BID -Continue with supplemental oxygen via nasal cannula as above -Currently holding Spiriva as she is on duo nebs -Chest x-ray as above  -Send Sputum Cx  History multinodular goiter and Thyroid cancer s/p Thyroidectomy -Continue Synthroid 224 daily -TSH in 3 weeks-low suspicion this is related to thyroid related illness  Right Leg Pain/Hip Pain -Pain control as below -Added Diclofenac Gel -Obtained X-Ray of the Hip and Femur;  -Femur X-Ray showed There is no evidence of fracture or other focal bone lesions. Soft tissues are unremarkable. -Hip X-Ray showed There is no evidence of acute hip fracture or dislocation. The bony pelvis is intact without evidence of fracture or diastasis. 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 -PT/OT to evaluate and Treat and recommending no OT follow up with [facility] Aide  Left Sided Neck Swelling -Has a Hx of of Thyroidectomy -Obtain Neck X-Ray and U/S -U/S of the neck showed Unremarkable neck ultrasound demonstrating no evidence of lymphadenopathy or abnormal soft tissue mass.  Diabetes Mellitus Type 2 -Continue Metformin 500 twice bid -CBG's ranging from 121-149 -As hemoglobin A1c was 5.9 back in June 2018 I will repeat this visit -Continue to monitor blood sugars carefully and if necessary will add Novolog sensitive sliding scale insulin before meals  Constipation, improved  -Holding loperamide and Reglan as unclear which is which -She states she needs to use the bathroom once a daily "because 1 medication causes her to use the bathroom" -Started the patient on senna docusate 1 tab p.o. twice  daily, MiraLAX 17 g p.o. twice daily, and added Bisacodyl 10 mg rectal suppositories as needed  GERD -C/w Pantoprazole 40 Daily  Chronic pain from lumbago and prior back surgery -Continue with Hydrocodone-Acetaminophen but increased to 1-2 tab p.o. every 6 PRN for moderate pain -Added Diclofenac Gel as above   Super Morbid Obesity -Estimated body mass index is 51.59 kg/m as calculated from the following:   Height as of this encounter: 5\' 5"  (1.651 m).   Weight as of this encounter: 140.6 kg. -Weight Loss and Dietary Counseling   Leukocytosis -Patient's WBC was 12.9 on admission and repeat was 11.4 -Likely reactive and currently afebrile; ? Related to Pain -Continue to Monitor for S/Sx of Infection -Repeat CBC in AM  Normocytic Anemia -Patient's Hgb/Hct was 10.9/37.6 on admission and repeat was 12.0/40.1 and now 11.8/38.8 -Check Anemia Panel in AM  -Continue to Monitor for S/Sx of Bleeding -Repeat CBC in AM   Depression and Anxiety -Continue Clonazepam 2 mg po TID  Hx of Gout -C/w Allopurinol 100 mg po Daily  HLD -C/w Pravastatin 40 mg po Daily   pHTN -C/w Diuresis as above  Obesity Hypoventilation Syndrome -Needs Outpatient Sleep Study -Will place on CPAP qHS  Hyperphosphatemia -Patient's phosphorus level went from 4.4 is not 5.5 -  Continue to monitor and repeat phosphorus level in the a.m.  DVT prophylaxis: Enoxaparin 40 mg sq q24h Code Status: FULL CODE Family Communication: No Family present at bedside  Disposition Plan: Anticipate D/C when Back to Baseline weight and no longer getting IV Diuresis and Respiratory Status improves  Consultants:   None   Procedures: U/S of the Neck   Antimicrobials:  Anti-infectives (From admission, onward)   None     Subjective: Seen and Examined at bedside and she is sitting in the chair bedside complaining to the nurse tech that her oxygen was not on.  Pulse oximetry was done and she was 83%.  Oxygen was placed  back in the patient.  Patient wanted to know about her x-rays and I updated her.  Denied any chest pain, still feels short of breath and is coughing up some sputum now.  Leg swelling is still there.  No lightheadedness or dizziness.  No other concerns or complaints at this time.  Objective: Vitals:   07/06/18 2054 07/07/18 0122 07/07/18 0504 07/07/18 0929  BP:   (!) 145/80 103/70  Pulse:   98 (!) 105  Resp:   18 18  Temp:   98.4 F (36.9 C) 98.5 F (36.9 C)  TempSrc:   Oral Oral  SpO2: 92%  93% 93%  Weight:  (!) 140.6 kg    Height:        Intake/Output Summary (Last 24 hours) at 07/07/2018 1004 Last data filed at 07/07/2018 0600 Gross per 24 hour  Intake 724.42 ml  Output 2300 ml  Net -1575.58 ml   Filed Weights   07/05/18 0215 07/06/18 0336 07/07/18 0122  Weight: (!) 139.9 kg (!) 141.8 kg (!) 140.6 kg   Examination: Physical Exam:  Constitutional: Well Nourished, well-developed super morbidly obese African-American female currently no acute distress appears little agitated sitting in the bedside Eyes: Lids and conjunctive are normal.  Sclera anicteric ENMT: Ears and nose appear normal.  Mucous members are moist Neck: Appears supple with difficulty assess JVD due to body habitus.  Has some neck swelling on the left and this is likely adipose tissue Respiratory: Diminished auscultation bilaterally with coarse breath sounds and some crackles but no appreciable wheezing, rales, rhonchi.  Patient is not tachypneic or using any accessory muscles to breathe but was very hypoxic (83% on RA) so supplemental oxygen via nasal cannula was readministered Cardiovascular: Regular rate and rhythm.  No appreciable murmurs, rubs, gallops.  Has 1-2+ lower extremity pitting edema still Abdomen: Soft, nontender, distended secondary body habitus.  Bowel sounds present in all 4 quadrants GU: Deferred Musculoskeletal: No contractures or cyanosis.  No joint deformities in upper lower extremities Skin:  No appreciable rashes or lesions on limited skin evaluation. Neurologic: Cranial nerves II through XII grossly intact with no appreciable focal deficit.  Romberg sign and cerebellar reflexes were not assessed Psychiatric: Mood judgment and insight.  Patient is awake, alert, oriented x3.  Has a slightly agitated mood and has an inappropriate affect and appears a little histrionic   Data Reviewed: I have personally reviewed following labs and imaging studies  CBC: Recent Labs  Lab 07/04/18 0520 07/05/18 0807 07/06/18 0409 07/07/18 0324  WBC 12.9* 12.6* 12.5* 11.4*  NEUTROABS 10.0* 9.9* 9.6* 8.4*  HGB 10.9* 12.0 11.9* 11.8*  HCT 37.6 40.1 39.1 38.8  MCV 97.4 94.8 95.4 95.3  PLT 243 251 255 256   Basic Metabolic Panel: Recent Labs  Lab 07/04/18 0520 07/05/18 0337 07/05/18 0807 07/06/18 0409  07/07/18 0324  NA 140 140 139 139 138  K 4.4 4.0 4.1 3.8 3.7  CL 99 92* 89* 89* 91*  CO2 31 38* 37* 37* 33*  GLUCOSE 120* 125* 105* 105* 104*  BUN 9 8 7 9 11   CREATININE 0.56 0.61 0.56 0.57 0.59  CALCIUM 8.7* 8.7* 8.7* 8.4* 8.3*  MG  --   --  1.8 2.0 1.9  PHOS  --   --  4.4 5.0* 5.5*   GFR: Estimated Creatinine Clearance: 109.4 mL/min (by C-G formula based on SCr of 0.59 mg/dL). Liver Function Tests: Recent Labs  Lab 07/05/18 0807 07/06/18 0409 07/07/18 0324  AST 18 18 17   ALT 19 17 16   ALKPHOS 88 86 85  BILITOT 0.6 0.5 0.5  PROT 7.4 7.1 7.0  ALBUMIN 3.9 3.5 3.7   No results for input(s): LIPASE, AMYLASE in the last 168 hours. No results for input(s): AMMONIA in the last 168 hours. Coagulation Profile: No results for input(s): INR, PROTIME in the last 168 hours. Cardiac Enzymes: No results for input(s): CKTOTAL, CKMB, CKMBINDEX, TROPONINI in the last 168 hours. BNP (last 3 results) No results for input(s): PROBNP in the last 8760 hours. HbA1C: No results for input(s): HGBA1C in the last 72 hours. CBG: Recent Labs  Lab 07/05/18 1218 07/05/18 1708 07/06/18 1638  07/06/18 2100 07/07/18 0615  GLUCAP 129* 125* 149* 126* 121*   Lipid Profile: No results for input(s): CHOL, HDL, LDLCALC, TRIG, CHOLHDL, LDLDIRECT in the last 72 hours. Thyroid Function Tests: No results for input(s): TSH, T4TOTAL, FREET4, T3FREE, THYROIDAB in the last 72 hours. Anemia Panel: No results for input(s): VITAMINB12, FOLATE, FERRITIN, TIBC, IRON, RETICCTPCT in the last 72 hours. Sepsis Labs: No results for input(s): PROCALCITON, LATICACIDVEN in the last 168 hours.  No results found for this or any previous visit (from the past 240 hour(s)).   Radiology Studies: Dg Neck Soft Tissue  Result Date: 07/06/2018 CLINICAL DATA:  Left neck swelling. History of prior thyroidectomy for thyroid carcinoma. EXAM: NECK SOFT TISSUES - 1+ VIEW COMPARISON:  Cervical spine films on 04/17/2017 FINDINGS: Frontal and lateral projections were obtained. Clips are present in the neck related to prior thyroidectomy. No obvious soft tissue findings. The visualized airway is normally patent. No prevertebral soft tissue swelling visualized in the upper cervical region. IMPRESSION: Unremarkable neck soft tissue x-rays. Electronically Signed   By: Aletta Edouard M.D.   On: 07/06/2018 14:59   US Soft Tissue Head & Neck (non-thyroid)  Result Date: 07/06/2018 CLINICAL DATA:  Swelling of left neck and history of prior thyroidectomy for thyroid carcinoma. EXAM: ULTRASOUND OF HEAD/NECK SOFT TISSUES TECHNIQUE: Ultrasound examination of the head and neck soft tissues was performed in the area of clinical concern. COMPARISON:  Neck ultrasound on 12/18/2016 FINDINGS: Ultrasound demonstrates no soft tissue mass, lymphadenopathy or abnormal soft tissue in the neck. No abnormal fluid collections identified. No findings suspicious for recurrent thyroid carcinoma. IMPRESSION: Unremarkable neck ultrasound demonstrating no evidence of lymphadenopathy or abnormal soft tissue mass. Electronically Signed   By: Aletta Edouard  M.D.   On: 07/06/2018 11:00   Dg Chest Port 1 View  Result Date: 07/07/2018 CLINICAL DATA:  Shortness of breath. EXAM: PORTABLE CHEST 1 VIEW COMPARISON:  Radiograph of July 06, 2018. FINDINGS: Stable cardiomediastinal silhouette. No pneumothorax is noted. Stable bibasilar opacities are noted concerning for atelectasis or possibly infiltrates. No pleural effusion is noted. Bony thorax is unremarkable. IMPRESSION: Stable bibasilar opacities concerning for atelectasis or infiltrates. Electronically Signed  By: Marijo Conception, M.D.   On: 07/07/2018 08:08   Dg Chest Port 1 View  Result Date: 07/06/2018 CLINICAL DATA:  Shortness of breath EXAM: PORTABLE CHEST 1 VIEW COMPARISON:  07/05/2018 FINDINGS: Lingular/left lower lobe scarring/atelectasis. Mild right basilar atelectasis. No focal consolidation. No frank interstitial edema. No pleural effusion or pneumothorax. The heart is normal in size. Degenerative changes of the visualized thoracolumbar spine. IMPRESSION: Lingular and bilateral lower lobe scarring/atelectasis. No evidence of acute cardiopulmonary disease. Electronically Signed   By: Julian Hy M.D.   On: 07/06/2018 07:38   Dg Hip Unilat With Pelvis 2-3 Views Right  Result Date: 07/06/2018 CLINICAL DATA:  Fall with right upper leg pain. EXAM: DG HIP (WITH OR WITHOUT PELVIS) 2-3V RIGHT COMPARISON:  06/27/2018 FINDINGS: There is no evidence of acute hip fracture or dislocation. The bony pelvis is intact without evidence of fracture or diastasis. 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 IMPRESSION: No acute fractures identified. Electronically Signed   By: Aletta Edouard M.D.   On: 07/06/2018 14:48   Dg Femur, Min 2 Views Right  Result Date: 07/06/2018 CLINICAL DATA:  Pain after fall. EXAM: RIGHT FEMUR 2 VIEWS COMPARISON:  None. FINDINGS: There is no evidence of fracture or other focal bone lesions. Soft tissues are unremarkable. IMPRESSION: Negative. Electronically Signed   By: Dorise Bullion III M.D   On: 07/06/2018 15:00   Scheduled Meds: . allopurinol  100 mg Oral Daily  . clonazePAM  2 mg Oral TID  . diclofenac sodium  2 g Topical QID  . enoxaparin (LOVENOX) injection  40 mg Subcutaneous Q24H  . furosemide  20 mg Intravenous Once  . furosemide  60 mg Intravenous BID  . guaiFENesin  1,200 mg Oral BID  . ipratropium-albuterol  3 mL Nebulization TID  . levothyroxine  224 mcg Oral QAC breakfast  . lisinopril  5 mg Oral Daily  . loratadine  10 mg Oral Daily  . mouth rinse  15 mL Mouth Rinse BID  . metFORMIN  500 mg Oral BID WC  . montelukast  10 mg Oral QHS  . pantoprazole  40 mg Oral Daily  . polyethylene glycol  17 g Oral BID  . pravastatin  40 mg Oral QPM  . senna-docusate  1 tablet Oral BID  . sodium chloride flush  3 mL Intravenous Q12H  . spironolactone  100 mg Oral Daily  . umeclidinium-vilanterol  1 puff Inhalation Daily   Continuous Infusions: . sodium chloride 250 mL (07/05/18 1057)    LOS: 3 days    Kerney Elbe, DO [facility] PAGER is on [place]  If 7PM-7AM, please contact night-coverage [email or web address] Password [email or web address] 07/07/2018, 10:04 AM

## 2018-07-07 NOTE — Progress Notes (Signed)
SATURATION QUALIFICATIONS: (This note is used to comply with regulatory documentation for home oxygen)  Patient Saturations on Room Air at Rest = 85%  Patient Saturations on Room Air while Ambulating = NT due to desat on RA at rest  Patient Saturations on 3 Liters of oxygen while Ambulating = Did not ambulate but needed 3L to maintain sats >90%.   Please briefly explain why patient needs home oxygen:Will need home O2 as pt desats at rest on RA.  [place] Pager:  [phone]  Office:  [phone]

## 2018-07-08 ENCOUNTER — Inpatient Hospital Stay (HOSPITAL_COMMUNITY): Payer: Medicaid Other

## 2018-07-08 LAB — CBC WITH DIFFERENTIAL/PLATELET
Abs Immature Granulocytes: 0.1 10*3/uL — ABNORMAL HIGH (ref 0.00–0.07)
Basophils Absolute: 0 10*3/uL (ref 0.0–0.1)
Basophils Relative: 0 %
Eosinophils Absolute: 0.3 10*3/uL (ref 0.0–0.5)
Eosinophils Relative: 2 %
HEMATOCRIT: 40.4 % (ref 36.0–46.0)
Hemoglobin: 12.5 g/dL (ref 12.0–15.0)
Immature Granulocytes: 1 %
LYMPHS ABS: 2.1 10*3/uL (ref 0.7–4.0)
Lymphocytes Relative: 18 %
MCH: 29 pg (ref 26.0–34.0)
MCHC: 30.9 g/dL (ref 30.0–36.0)
MCV: 93.7 fL (ref 80.0–100.0)
MONO ABS: 0.7 10*3/uL (ref 0.1–1.0)
MONOS PCT: 6 %
Neutro Abs: 8.7 10*3/uL — ABNORMAL HIGH (ref 1.7–7.7)
Neutrophils Relative %: 73 %
Platelets: 280 10*3/uL (ref 150–400)
RBC: 4.31 MIL/uL (ref 3.87–5.11)
RDW: 13.3 % (ref 11.5–15.5)
WBC: 11.9 10*3/uL — ABNORMAL HIGH (ref 4.0–10.5)
nRBC: 0 % (ref 0.0–0.2)

## 2018-07-08 LAB — GLUCOSE, CAPILLARY
GLUCOSE-CAPILLARY: 132 mg/dL — AB (ref 70–99)
GLUCOSE-CAPILLARY: 114 mg/dL — AB (ref 70–99)
Glucose-Capillary: 119 mg/dL — ABNORMAL HIGH (ref 70–99)
Glucose-Capillary: 130 mg/dL — ABNORMAL HIGH (ref 70–99)

## 2018-07-08 LAB — COMPREHENSIVE METABOLIC PANEL
ALT: 20 U/L (ref 0–44)
AST: 23 U/L (ref 15–41)
Albumin: 3.9 g/dL (ref 3.5–5.0)
Alkaline Phosphatase: 82 U/L (ref 38–126)
Anion gap: 16 — ABNORMAL HIGH (ref 5–15)
BUN: 14 mg/dL (ref 6–20)
CALCIUM: 8.3 mg/dL — AB (ref 8.9–10.3)
CO2: 31 mmol/L (ref 22–32)
CREATININE: 0.66 mg/dL (ref 0.44–1.00)
Chloride: 90 mmol/L — ABNORMAL LOW (ref 98–111)
GFR calc Af Amer: >60 mL/min (ref >60)
GFR calc non Af Amer: >60 mL/min (ref >60)
Glucose, Bld: 117 mg/dL — ABNORMAL HIGH (ref 70–99)
Potassium: 3.8 mmol/L (ref 3.5–5.1)
Sodium: 137 mmol/L (ref 135–145)
TOTAL PROTEIN: 7.4 g/dL (ref 6.5–8.1)
Total Bilirubin: 0.8 mg/dL (ref 0.3–1.2)

## 2018-07-08 LAB — VITAMIN B12: Vitamin B-12: 473 pg/mL (ref 180–914)

## 2018-07-08 LAB — FOLATE: Folate: 9.2 ng/mL (ref >5.9)

## 2018-07-08 LAB — IRON AND TIBC
IRON: 64 ug/dL (ref 28–170)
Saturation Ratios: 18 % (ref 10.4–31.8)
TIBC: 364 ug/dL (ref 250–450)
UIBC: 300 ug/dL

## 2018-07-08 LAB — FERRITIN: Ferritin: 92 ng/mL (ref 11–307)

## 2018-07-08 LAB — MAGNESIUM: Magnesium: 1.9 mg/dL (ref 1.7–2.4)

## 2018-07-08 LAB — RETICULOCYTES
Immature Retic Fract: 19 % — ABNORMAL HIGH (ref 2.3–15.9)
RBC.: 4.31 MIL/uL (ref 3.87–5.11)
Retic Count, Absolute: 154.3 10*3/uL (ref 19.0–186.0)
Retic Ct Pct: 3.6 % — ABNORMAL HIGH (ref 0.4–3.1)

## 2018-07-08 LAB — PHOSPHORUS: Phosphorus: 4.3 mg/dL (ref 2.5–4.6)

## 2018-07-08 MED ORDER — ENOXAPARIN SODIUM 80 MG/0.8ML ~~LOC~~ SOLN
70.0000 mg | Freq: Every day | SUBCUTANEOUS | Status: DC
  Administered 2018-07-09 – 2018-07-11 (×3): 70 mg via SUBCUTANEOUS
  Filled 2018-07-08 (×3): qty 0.8

## 2018-07-08 MED ORDER — ENOXAPARIN SODIUM 30 MG/0.3ML ~~LOC~~ SOLN
30.0000 mg | SUBCUTANEOUS | Status: AC
  Administered 2018-07-08: 30 mg via SUBCUTANEOUS
  Filled 2018-07-08: qty 0.3

## 2018-07-08 MED ORDER — FUROSEMIDE 10 MG/ML IJ SOLN
20.0000 mg | Freq: Once | INTRAMUSCULAR | Status: AC
  Administered 2018-07-08: 20 mg via INTRAVENOUS

## 2018-07-08 MED ORDER — FUROSEMIDE 10 MG/ML IJ SOLN
80.0000 mg | Freq: Two times a day (BID) | INTRAMUSCULAR | Status: DC
  Administered 2018-07-08 – 2018-07-10 (×5): 80 mg via INTRAVENOUS
  Filled 2018-07-08 (×7): qty 8

## 2018-07-08 NOTE — Progress Notes (Signed)
Patient has home oxygen as prior to admission through [facility] [phone]

## 2018-07-08 NOTE — Progress Notes (Signed)
Patient given evening medications.  Pt demanded thermostat be adjusted, completed. Pt demanded door be opened, completed. Pt complained about medication pass taking so long. Pt informed her demands were being met/med pass interrupted.    Pt complained too much talking was happening and questioned what this RN "was on". Pt informed its difficult to maintain patience when patients are verbally abusive toward staff, continuously firing nurses who are just trying to help her.  Pt expresses her frustration with being hospitalized, but is reminded that does not warrant her treating staff the way she is. Pt offers apology, but is asked to match her behavior adjustments to the apology,as that would matter much more to staff.

## 2018-07-08 NOTE — Progress Notes (Signed)
PROGRESS NOTE    Michele Owens  DUK:[id number] DOB: 08/04/1960 DOA: 07/04/2018 PCP: [name], MD  Brief Narrative:  The patient is a 58 year old African-American super morbidly obese female with a past medical history significant for chronic diastolic CHF with a baseline EF of 60 to 65%, COPD Gold stage II-III on 2 L of home oxygen normally, diabetes mellitus type 2, hypertension, prior DVT, hypothyroidism, GERD, hyperlipidemia, gout other comorbidities who presented to the emergency room with a chief complaint of breath.  Recently patient was hospitalized from 3/9 to 06/19/2018 and has had a couple ED visits in between then her hospitalization was significant for acute on chronic diastolic CHF is felt her BNP was not a good indicator of her extra fluid status at that time.  She had a vascular ultrasound did not reveal a DVT of her lower swelling.  She returned to the ED with bilateral feet swelling on 323 and her O2 saturation was 70 and 80% and she was placed on BiPAP and then she was recently discharged from the hospital and been fairly well on her home health nurses did not show up for her and she admitted to dietary indiscretion by eating a lot of meat and not sticking to her Lasix normally.  EMS was called and she was placed on BiPAP to the emergency room for further evaluation and required 4 L of oxygen currently after being taken off oxygen now is back on 2 L.  She is being diuresed and is currently being admitted for acute on chronic diastolic CHF  Hospitalization has been complicated by left sided neck swelling along with right-sided leg pain. Still extremely volume overloaded so will increase diuresis again today   Assessment & Plan:   Principal Problem:   Acute and chronic respiratory failure with hypercapnia (HCC) Active Problems:   OSA (obstructive sleep apnea)   Dyslipidemia associated with type 2 diabetes mellitus (HCC)   Asthma with COPD with exacerbation (HCC)  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   HLD (hyperlipidemia)   Gout   DVT (deep vein thrombosis) in pregnancy   AKI (acute kidney injury) ([place])   Atypical chest pain   Fall at home, initial encounter   HNP (herniated nucleus pulposus), thoracic   CHF exacerbation (HCC)   Acute heart failure (HCC)  Acute on chronic respiratory failure with hypercapnia in the setting of type factorial causes including 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 Decreased bibasilar atelectasis." -Continue with diuresis but increase IV Lasix 60 mg twice daily to 80 mg po BID -Coreg was held in the setting of acute diastolic CHF exacerbation but will resumed yesterday; She is not taking Metoprolol Succinate -Continue Lisinopril 5 mg po Daily  -Last ECHO in 2019 showed EF of 60-65% (improved from 45-50% previously) with Grade 1 DD (Previously had Grade 2) -Will consider repeating ECHOCardiogram if Cardiology department will allow  -Strict I's and O's and daily weights, Currently she is down 13 lbs and she is -6.802 Liters since admission -Fluid restriction to 1500 mL but may cut to 1200 if still having inadequate Diuresis  -Continue diuresis as above -Resumed Spironolactone 100 mg p.o. daily and will continue -Continue DuoNeb q6h and changed to TID  -Likely need a larger dose  of Lasix at discharge as she was only taking 20 mg po Daily  -Continue supplemental oxygen via nasal cannula and wean O2 as tolerated to home dose -Continuous pulse oximetry and maintain O2 saturations greater than 90%" -Would Like to repeat ECHO but not emergent and would likely be cancelled by Cardiology Department given concern for the [place]; Will  discuss with Cardiology this AM -We will need home Ambulatory screen prior to discharge to assess for O2 and she Desaturated significantly yesterday  -Needs Significant Heart Failure and COPD education; Patient has not been adherent to low salt diet, fluid restriction, along with Education about her COPD -If continues to Have slow Diuresis may consider Cardiology Consultation   COPD Gold stage II-III on home oxygen -Oxygen requirements have decreased significantly she is now on 4 L and will wean to Home O2; ? Unclear how many liters she has been on -Patient apparently turns down her Home O2 depending on her Blood Sugar and I told her that her blood sugar readings had no bearing on her O2 Requirements or Hypoxia  -Continue inhalers-she does not need stepdown and was transferred to telemetry yesterday -Continue with DuoNeb 3 mils q6h and transitioned to TID -C/w Anora Ellipta -Changed Guaifenesin 100 mg po q4hprn Cough to Guaifenesin 1200 mg po BID -Continue with supplemental oxygen via nasal cannula as above -Currently holding Spiriva as she is on duo nebs -Chest x-ray as above  -Send Sputum Cx but sputum sample was unacceptable so will need repeat   History multinodular goiter and Thyroid cancer s/p Thyroidectomy -Continue Synthroid 224 daily -TSH in 3 weeks-low suspicion this is related to thyroid related illness  Right Leg Pain/Hip Pain -Pain control as below -Added Diclofenac Gel and wil continue  -Obtained X-Ray of the Hip and Femur;  -Femur X-Ray showed There is no evidence of fracture or other focal bone lesions. Soft tissues are unremarkable. -Hip X-Ray showed There is no evidence of acute hip fracture or dislocation. The bony pelvis is intact without evidence of fracture or diastasis. 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 -PT/OT to evaluate and Treat and recommending no OT follow up with [place] -C/w Medications a below   Left Sided Neck Swelling -Has a Hx of of  Thyroidectomy -Obtain Neck X-Ray and U/S -U/S of the neck showed Unremarkable neck ultrasound demonstrating no evidence of lymphadenopathy or abnormal soft tissue mass.  Diabetes Mellitus Type 2 -Continue Metformin 500 twice bid -CBG's ranging from 119-177 -As hemoglobin A1c was 5.9 back in June 2018 I will repeat this visit -Continue to monitor blood sugars carefully and if necessary will add Novolog sensitive sliding scale insulin before meals  Constipation, improved  -Holding loperamide and Reglan as unclear which is which -She states she needs to use the bathroom once a daily "because 1 medication causes her to use the bathroom" -Started the patient on senna docusate 1 tab p.o. twice daily, MiraLAX 17 g p.o. twice daily, and added Bisacodyl 10 mg rectal suppositories as needed  GERD -C/w Pantoprazole 40 Daily  Chronic pain from lumbago and prior back surgery -Continue with Hydrocodone-Acetaminophen but increased to 1-2 tab p.o. every 6 PRN for moderate pain -Added Diclofenac Gel as above   Super Morbid Obesity -Estimated body mass index is 51.5 kg/m as calculated from the following:   Height as of this encounter: 5\' 5"  (1.651 m).   Weight as of this encounter: 140.4 kg. -Weight Loss and Dietary Counseling   Leukocytosis -Patient's WBC was  12.9 on admission and repeat was 11.4 and now slightly trending up to 11.9 -Likely reactive and currently afebrile; ? Related to Pain -Continue to Monitor for S/Sx of Infection -Repeat CBC in AM  Normocytic Anemia -Patient's Hgb/Hct was 10.9/37.6 on admission and repeat was 12.0/40.1 and now 12.5/40.4 -Checked Anemia Panel and showed iron level of 64, U IBC of 300, TIBC of 364, saturation ratios of 18%, ferritin level of 92, folate level 9.2, and vitamin B12 level 473 -Continue to Monitor for S/Sx of Bleeding -Repeat CBC in AM   Depression and Anxiety -Continue Clonazepam 2 mg po TID  Hx of Gout -C/w Allopurinol 100 mg po Daily   HLD -C/w Pravastatin 40 mg po Daily   pHTN -C/w Diuresis as above  Obesity Hypoventilation Syndrome -Needs Outpatient Sleep Study -Will place on CPAP qHS  Hyperphosphatemia -Patient's phosphorus level is now 4.3 -Continue to monitor and repeat phosphorus level in the a.m.  DVT prophylaxis: Enoxaparin 40 mg sq q24h to be increased to dose of 0.5 mg/kg/d as BMI >30 Code Status: FULL CODE Family Communication: No Family present at bedside  Disposition Plan: Anticipate D/C when Back to Baseline weight and no longer getting IV Diuresis and Respiratory Status improves; Still remains significantly volume overloaded and Hypoxic without supplemental O2 via [place]  Consultants:   None   Procedures: U/S of the Neck  Will consider repeating ECHOCardiogram if Cardiology Deparment will allow   Antimicrobials:  Anti-infectives (From admission, onward)   None     Subjective: Seen and Examined at bedside had several complaints about her care including nighttime staff, dietary services, as well as environmental services.  Does not feel as short of breath today but was complaining of some left shoulder pain..  Had to be educated on usage of O2 as she was weaning down oxygen and removing it when her "blood sugar was good."  No nausea or vomiting.  Still volume overloaded.  No other concerns or complaints at this time and was frustrated about not going home today.  Had to be educated again about dietary and fluid restriction.  Objective: Vitals:   07/07/18 1921 07/07/18 2040 07/08/18 0023 07/08/18 0634  BP: 103/75   117/77  Pulse: (!) 103   96  Resp: 18   18  Temp: 98.9 F (37.2 C)   99 F (37.2 C)  TempSrc: Oral   Oral  SpO2: 94% 92%  90%  Weight:   (!) 140.4 kg   Height:        Intake/Output Summary (Last 24 hours) at 07/08/2018 1127 Last data filed at 07/08/2018 7341 Gross per 24 hour  Intake 1562 ml  Output 2100 ml  Net -538 ml   Filed Weights   07/06/18 0336 07/07/18 0122  07/08/18 0023  Weight: (!) 141.8 kg (!) 140.6 kg (!) 140.4 kg   Examination: Physical Exam:  Constitutional: Well-nourished, well-developed super morbidly obese African-American female currently no acute distress appears frustrated that she is not going home again today. Eyes: Conjunctive are normal.  Sclera anicteric ENMT: External ears and nose appear normal.  Mucous members are moist Neck: Appears supple JVD status difficult to assess due to her body habitus.  Has some left-sided neck swelling and appears to be adipose tissue Respiratory: Diminished auscultation bilaterally with coarse breath sounds and some crackles but no appreciable wheezing, rales, rhonchi.  She is not tachypneic or using any accessory muscles to breathe but she is still wearing supplemental oxygen via nasal cannula today Cardiovascular:  Regular rate and rhythm.  No appreciable murmurs, rubs, gallops.  Still has 2+ lower extremity pitting edema Abdomen: Soft, nontender, distended secondary to body habitus.  Backslash present GU: Deferred Musculoskeletal: No contractures or cyanosis.  No joint deformities upper extremities Skin: No appreciable rashes or lesions on to skin evaluation Neurologic: Cranial nerves II through XII grossly intact no appreciable focal deficits.  Romberg sign is cerebellar reflexes were not assessed Psychiatric: No mood and affect.  Patient is awake, alert, oriented x3.  Still acting histrionic  Data Reviewed: I have personally reviewed following labs and imaging studies  CBC: Recent Labs  Lab 07/04/18 0520 07/05/18 0807 07/06/18 0409 07/07/18 0324 07/08/18 0546  WBC 12.9* 12.6* 12.5* 11.4* 11.9*  NEUTROABS 10.0* 9.9* 9.6* 8.4* 8.7*  HGB 10.9* 12.0 11.9* 11.8* 12.5  HCT 37.6 40.1 39.1 38.8 40.4  MCV 97.4 94.8 95.4 95.3 93.7  PLT 243 251 255 260 811   Basic Metabolic Panel: Recent Labs  Lab 07/05/18 0337 07/05/18 0807 07/06/18 0409 07/07/18 0324 07/08/18 0546  NA 140 139 139  138 137  K 4.0 4.1 3.8 3.7 3.8  CL 92* 89* 89* 91* 90*  CO2 38* 37* 37* 33* 31  GLUCOSE 125* 105* 105* 104* 117*  BUN 8 7 9 11 14   CREATININE 0.61 0.56 0.57 0.59 0.66  CALCIUM 8.7* 8.7* 8.4* 8.3* 8.3*  MG  --  1.8 2.0 1.9 1.9  PHOS  --  4.4 5.0* 5.5* 4.3   GFR: Estimated Creatinine Clearance: 109.4 mL/min (by C-G formula based on SCr of 0.66 mg/dL). Liver Function Tests: Recent Labs  Lab 07/05/18 0807 07/06/18 0409 07/07/18 0324 07/08/18 0546  AST 18 18 17 23   ALT 19 17 16 20   ALKPHOS 88 86 85 82  BILITOT 0.6 0.5 0.5 0.8  PROT 7.4 7.1 7.0 7.4  ALBUMIN 3.9 3.5 3.7 3.9   No results for input(s): LIPASE, AMYLASE in the last 168 hours. No results for input(s): AMMONIA in the last 168 hours. Coagulation Profile: No results for input(s): INR, PROTIME in the last 168 hours. Cardiac Enzymes: No results for input(s): CKTOTAL, CKMB, CKMBINDEX, TROPONINI in the last 168 hours. BNP (last 3 results) No results for input(s): PROBNP in the last 8760 hours. HbA1C: No results for input(s): HGBA1C in the last 72 hours. CBG: Recent Labs  Lab 07/06/18 2100 07/07/18 0615 07/07/18 1438 07/07/18 2115 07/08/18 0637  GLUCAP 126* 121* 177* 129* 119*   Lipid Profile: No results for input(s): CHOL, HDL, LDLCALC, TRIG, CHOLHDL, LDLDIRECT in the last 72 hours. Thyroid Function Tests: No results for input(s): TSH, T4TOTAL, FREET4, T3FREE, THYROIDAB in the last 72 hours. Anemia Panel: Recent Labs    07/08/18 0546  VITAMINB12 473  FOLATE 9.2  FERRITIN 92  TIBC 364  IRON 64  RETICCTPCT 3.6*   Sepsis Labs: No results for input(s): PROCALCITON, LATICACIDVEN in the last 168 hours.  Recent Results (from the past 240 hour(s))  Expectorated sputum assessment w rflx to resp cult     Status: None   Collection Time: 07/07/18 10:03 AM  Result Value Ref Range Status   Specimen Description EXPECTORATED SPUTUM  Final   Special Requests NONE  Final   Sputum evaluation   Final    Sputum  specimen not acceptable for testing.  Please recollect.   CRITICAL RESULT CALLED TO, READ BACK BY AND VERIFIED WITH: RN Dorrene German [id number] 67 BY CM Performed at [facility], [place] [address], [place], [place]  [address]    Report Status 07/07/2018 FINAL  Final     Radiology Studies: Dg Neck Soft Tissue  Result Date: 07/06/2018 CLINICAL DATA:  Left neck swelling. History of prior thyroidectomy for thyroid carcinoma. EXAM: NECK SOFT TISSUES - 1+ VIEW COMPARISON:  Cervical spine films on 04/17/2017 FINDINGS: Frontal and lateral projections were obtained. Clips are present in the neck related to prior thyroidectomy. No obvious soft tissue findings. The visualized airway is normally patent. No prevertebral soft tissue swelling visualized in the upper cervical region. IMPRESSION: Unremarkable neck soft tissue x-rays. Electronically Signed   By: Aletta Edouard M.D.   On: 07/06/2018 14:59   Dg Chest Port 1 View  Result Date: 07/08/2018 CLINICAL DATA:  Shortness of breath, history chronic diastolic CHF, COPD, diabetes mellitus, hypertension, thyroid cancer EXAM: PORTABLE CHEST 1 VIEW COMPARISON:  Portable exam 0708 hours compared to 07/07/2018 FINDINGS: Enlargement of cardiac silhouette with pulmonary vascular congestion. Mediastinal contours normal with atherosclerotic calcification aorta noted. Bibasilar atelectasis decreased since previous exam. Upper lungs clear. No pleural effusion or pneumothorax. Surgical clips in the cervical region bilaterally likely reflect prior thyroidectomy. IMPRESSION: Enlargement of cardiac silhouette with pulmonary vascular congestion. Decreased bibasilar atelectasis. Electronically Signed   By: Lavonia Dana M.D.   On: 07/08/2018 08:14   Dg Chest Port 1 View  Result Date: 07/07/2018 CLINICAL DATA:  Shortness of breath. EXAM: PORTABLE CHEST 1 VIEW COMPARISON:  Radiograph of July 06, 2018. FINDINGS: Stable cardiomediastinal silhouette. No pneumothorax is noted. Stable  bibasilar opacities are noted concerning for atelectasis or possibly infiltrates. No pleural effusion is noted. Bony thorax is unremarkable. IMPRESSION: Stable bibasilar opacities concerning for atelectasis or infiltrates. Electronically Signed   By: Marijo Conception, M.D.   On: 07/07/2018 08:08   Dg Hip Unilat With Pelvis 2-3 Views Right  Result Date: 07/06/2018 CLINICAL DATA:  Fall with right upper leg pain. EXAM: DG HIP (WITH OR WITHOUT PELVIS) 2-3V RIGHT COMPARISON:  06/27/2018 FINDINGS: There is no evidence of acute hip fracture or dislocation. The bony pelvis is intact without evidence of fracture or diastasis. 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 IMPRESSION: No acute fractures identified. Electronically Signed   By: Aletta Edouard M.D.   On: 07/06/2018 14:48   Dg Femur, Min 2 Views Right  Result Date: 07/06/2018 CLINICAL DATA:  Pain after fall. EXAM: RIGHT FEMUR 2 VIEWS COMPARISON:  None. FINDINGS: There is no evidence of fracture or other focal bone lesions. Soft tissues are unremarkable. IMPRESSION: Negative. Electronically Signed   By: Dorise Bullion III M.D   On: 07/06/2018 15:00   Scheduled Meds: . allopurinol  100 mg Oral Daily  . carvedilol  6.25 mg Oral BID WC  . clonazePAM  2 mg Oral TID  . diclofenac sodium  2 g Topical QID  . enoxaparin (LOVENOX) injection  40 mg Subcutaneous Q24H  . furosemide  80 mg Intravenous BID  . guaiFENesin  1,200 mg Oral BID  . ipratropium-albuterol  3 mL Nebulization TID  . levothyroxine  224 mcg Oral QAC breakfast  . lisinopril  5 mg Oral Daily  . loratadine  10 mg Oral Daily  . mouth rinse  15 mL Mouth Rinse BID  . metFORMIN  500 mg Oral BID WC  . montelukast  10 mg Oral QHS  . pantoprazole  40 mg Oral Daily  . polyethylene glycol  17 g Oral BID  . pravastatin  40 mg Oral QPM  . senna-docusate  1 tablet Oral BID  .  sodium chloride flush  3 mL Intravenous Q12H  . spironolactone  100 mg Oral Daily  . umeclidinium-vilanterol  1  puff Inhalation Daily   Continuous Infusions: . sodium chloride 250 mL (07/05/18 1057)    LOS: 4 days    Kerney Elbe, DO [facility] PAGER is on [place]  If 7PM-7AM, please contact night-coverage [email or web address] Password [email or web address] 07/08/2018, 11:27 AM

## 2018-07-08 NOTE — Progress Notes (Signed)
PT Cancellation Note  Patient Details Name: Michele Owens MRN: [id number] DOB: Apr 10, 1961   Cancelled Treatment:    Reason Eval/Treat Not Completed: Patient declined, no reason specified patient in chair, very argumentative and yelling at PT that she will not walk until she eats. Will attempt to try back if time and schedule allow.    Deniece Ree PT, DPT, CBIS  Supplemental Physical Therapist [facility]    Pager [phone] Acute Rehab Office [phone]

## 2018-07-08 NOTE — Progress Notes (Signed)
Physical Therapy Treatment Patient Details Name: Michele Owens MRN: [id number] DOB: 05/11/1960 Today's Date: 07/08/2018    History of Present Illness 58 y.o. female with history of diastolic CHF, COPD, diabetes mellitus, gout, morbid obesity, and recent fall on 06/16/2018 presenting with SOB and worsening hypoxia.    PT Comments    Patient received up in chair, attempting to decline PT stating "my food is still digesting", required extended encouragement to participate in therapy after which patient impulsively stood up from chair saying "I'm gonna get you all out of my hair, if I pass out ITS ON YOU", required cues to pause for line transfer to portable tank. Able to walk in hallway with distant S but with poor safety awareness and energy conservation, O2 eventually increased to 2LPM from 1LPM due to patient not allowing PT to check vitals and with labored breathing. Used distraction to look at picture in hallway and allow patient to rest, after which breathing pattern/effort improved. She became agitated again in her room, yelling at therapist to get paper towels and "put them under the sink", and also screamed at therapist for not leaving door cracked enough for her. Patient is generally at distant S level and does not appear to be in need of skilled PT services, PT signing off for now however recommend continued mobility tech services.     Follow Up Recommendations  (declining HHPT due to [facility] )     Equipment Recommendations  Other (comment)(rollator if patient agrees )    Recommendations for Other Services       Precautions / Restrictions Precautions Precautions: Fall Precaution Comments: fall on 3/8  Restrictions Weight Bearing Restrictions: No    Mobility  Bed Mobility               General bed mobility comments: OOB in chair   Transfers Overall transfer level: Modified independent Equipment used: Straight cane Transfers: Sit to/from Stand Sit to Stand: Modified  independent (Device/Increase time)         General transfer comment: Mod(I), very impulsive but appears steady with [facility]   Ambulation/Gait Ambulation/Gait assistance: Supervision Gait Distance (Feet): 225 Feet Assistive device: Straight cane Gait Pattern/deviations: Step-through pattern;Trunk flexed;Shuffle Gait velocity: slowed   General Gait Details: able to ambulate in hallway with distant S, very poor safety awareness and not answering therapists questions as to SOB/fatigue/etc. Needed cues to stop and look at picture in hallway for energy conservation    Stairs             Wheelchair Mobility    Modified Rankin (Stroke Patients Only)       Balance Overall balance assessment: Needs assistance Sitting-balance support: Feet supported;No upper extremity supported Sitting balance-Leahy Scale: Good     Standing balance support: During functional activity;Single extremity supported Standing balance-Leahy Scale: Fair Standing balance comment: needs SPC but steady at own pace                             Cognition Arousal/Alertness: Awake/alert Behavior During Therapy: Agitated;Impulsive Overall Cognitive Status: No family/caregiver present to determine baseline cognitive functioning Area of Impairment: Orientation;Attention;Memory;Following commands;Safety/judgement;Awareness;Problem solving                 Orientation Level: Person;Place;Time;Situation Current Attention Level: Selective   Following Commands: Follows one step commands inconsistently;Follows multi-step commands inconsistently Safety/Judgement: Decreased awareness of safety;Decreased awareness of deficits Awareness: Intellectual Problem Solving: Requires verbal cues General Comments: very impulsive and  agitated, low frustration tolerance       Exercises      General Comments General comments (skin integrity, edema, etc.): 1LPM O2 and labored breathing, did not allow PT to  check SPO2 or HR. Did require distraction/cues to look at picture in hallway and get breath back       Pertinent Vitals/Pain Pain Assessment: Faces Pain Score: 0-No pain Faces Pain Scale: No hurt Pain Intervention(s): Monitored during session    Home Living                      Prior Function            PT Goals (current goals can now be found in the care plan section) Acute Rehab PT Goals Patient Stated Goal: I need Migraine medicine PT Goal Formulation: With patient Time For Goal Achievement: 07/21/18 Potential to Achieve Goals: Fair Progress towards PT goals: Progressing toward goals    Frequency    Min 3X/week      PT Plan      Co-evaluation              AM-PAC PT "6 Clicks" Mobility   Outcome Measure  Help needed turning from your back to your side while in a flat bed without using bedrails?: A Little Help needed moving from lying on your back to sitting on the side of a flat bed without using bedrails?: A Little Help needed moving to and from a bed to a chair (including a wheelchair)?: A Little Help needed standing up from a chair using your arms (e.g., wheelchair or bedside chair)?: A Little Help needed to walk in hospital room?: A Little Help needed climbing 3-5 steps with a railing? : A Little 6 Click Score: 18    End of Session Equipment Utilized During Treatment: Oxygen Activity Tolerance: Patient tolerated treatment well Patient left: with call bell/phone within reach;in bed   PT Visit Diagnosis: Unsteadiness on feet (R26.81);Other abnormalities of gait and mobility (R26.89);Muscle weakness (generalized) (M62.81);Difficulty in walking, not elsewhere classified (R26.2);History of falling (Z91.81);Pain     Time: 1250-1307 PT Time Calculation (min) (ACUTE ONLY): 17 min  Charges:  $Gait Training: 8-22 mins                     Deniece Ree PT, DPT, CBIS  Supplemental Physical Therapist [place]    Pager [phone] Acute  Rehab Office [phone]

## 2018-07-08 NOTE — Progress Notes (Signed)
This patient has been calling the desk frequently this shift to state that she needs to walk. Patient has been advised that the Ambulation therapist will be with her. Unable to provided a time frame.   Physical therapy in to ambulate patient and patient yells out that "she will not walk until she eats her sandwich".  Then patient tells dietary that she will walk. When asking patient if she will walk now while P.T. is present on the unit , the patient declines to provided any response.   Patient has voiced her concerns about meals to the Environmental Supervisor this am as well as the MD.  Patient stated previously that she had not received her lasix as rx'd when indeed she had as well as the flush afterwards.  Patient declined her prescribed miralax this am and is now calling for a stool softner.   Patient speaks inappropriately to physicians commenting on their physical appearance. Then laughs and says" I'm a player".   Patient has dismissed multiple staff members from providing her further care.

## 2018-07-08 NOTE — Progress Notes (Signed)
Patient continues with inappropriate and agitated/aggressive behaviors during therapy, first attempting to decline PT (despite having been asking to walk all morning per RN report) by saying "my food is still digesting, I'll do it later". PT offered chair exercises and patient then became very agitated and quite impulsive, jumping up from chair with little regard for safety or line management  Required verbal cues to pause so PT could switch her from wall O2 to portable tank, patient then states "you all are disturbing me, I'll walk but if I pass out ITS ON YOU". Continued with inappropriate statements and behaviors in the hallway and refused to answer PT questions regarding shortness of breath or fatigue, did not allow PT to check vitals with pulse oximeter. Clearly with labored breathing but refused to stop and rest until distracted with pictures on hallway, O2 also increased to 2LPM at this point.  Once she was in her chair in room, she became very agitated with PT and began screaming at therapist over hand sanitizer, paper towels, and how much the door was cracked at end of session.  Once again, patient is at [facility] S-Mod(I) level with [facility] and has very little interest in or motivation to participate in PT. PT signing off for now as per her report she is at her functional baseline, however recommend continuation of mobility tech services moving forward.  Deniece Ree PT, DPT, CBIS  Supplemental Physical Therapist [facility]    Pager [phone] Acute Rehab Office [phone]

## 2018-07-08 NOTE — Progress Notes (Signed)
Patient walked two full lengths of the hallway this evening before bed.

## 2018-07-08 NOTE — Progress Notes (Signed)
Respiratory therapy in for patient treatment and patient displayed behaviors with therapist.  Tone of voice, sarcasm.RT will check back with patient.

## 2018-07-08 NOTE — Progress Notes (Signed)
Patient medicated for c/o pain in both legs and shoulders for multiple pre-existing conditions.   Pt denies needs.  Pt quickly recalls her desire for unsalted crackers.   Crackers provided. Water refused.

## 2018-07-09 ENCOUNTER — Inpatient Hospital Stay (HOSPITAL_COMMUNITY): Payer: Medicaid Other

## 2018-07-09 DIAGNOSIS — I5031 Acute diastolic (congestive) heart failure: Secondary | ICD-10-CM

## 2018-07-09 DIAGNOSIS — J9622 Acute and chronic respiratory failure with hypercapnia: Secondary | ICD-10-CM

## 2018-07-09 LAB — ECHOCARDIOGRAM LIMITED
Height: 65 in
Weight: 4902.4 oz

## 2018-07-09 LAB — CBC WITH DIFFERENTIAL/PLATELET
Abs Immature Granulocytes: 0.09 10*3/uL — ABNORMAL HIGH (ref 0.00–0.07)
Basophils Absolute: 0 10*3/uL (ref 0.0–0.1)
Basophils Relative: 0 %
Eosinophils Absolute: 0.3 10*3/uL (ref 0.0–0.5)
Eosinophils Relative: 2 %
HEMATOCRIT: 39.3 % (ref 36.0–46.0)
Hemoglobin: 11.7 g/dL — ABNORMAL LOW (ref 12.0–15.0)
Immature Granulocytes: 1 %
LYMPHS ABS: 2.1 10*3/uL (ref 0.7–4.0)
Lymphocytes Relative: 17 %
MCH: 28 pg (ref 26.0–34.0)
MCHC: 29.8 g/dL — ABNORMAL LOW (ref 30.0–36.0)
MCV: 94 fL (ref 80.0–100.0)
Monocytes Absolute: 0.8 10*3/uL (ref 0.1–1.0)
Monocytes Relative: 6 %
Neutro Abs: 9.4 10*3/uL — ABNORMAL HIGH (ref 1.7–7.7)
Neutrophils Relative %: 74 %
Platelets: 270 10*3/uL (ref 150–400)
RBC: 4.18 MIL/uL (ref 3.87–5.11)
RDW: 13.2 % (ref 11.5–15.5)
WBC: 12.6 10*3/uL — ABNORMAL HIGH (ref 4.0–10.5)
nRBC: 0 % (ref 0.0–0.2)

## 2018-07-09 LAB — COMPREHENSIVE METABOLIC PANEL
ALT: 19 U/L (ref 0–44)
AST: 25 U/L (ref 15–41)
Albumin: 3.8 g/dL (ref 3.5–5.0)
Alkaline Phosphatase: 77 U/L (ref 38–126)
Anion gap: 12 (ref 5–15)
BUN: 13 mg/dL (ref 6–20)
CO2: 33 mmol/L — ABNORMAL HIGH (ref 22–32)
Calcium: 8.2 mg/dL — ABNORMAL LOW (ref 8.9–10.3)
Chloride: 93 mmol/L — ABNORMAL LOW (ref 98–111)
Creatinine, Ser: 0.61 mg/dL (ref 0.44–1.00)
GFR calc non Af Amer: >60 mL/min (ref >60)
Glucose, Bld: 118 mg/dL — ABNORMAL HIGH (ref 70–99)
Potassium: 3.5 mmol/L (ref 3.5–5.1)
Sodium: 138 mmol/L (ref 135–145)
TOTAL PROTEIN: 7.4 g/dL (ref 6.5–8.1)
Total Bilirubin: 0.7 mg/dL (ref 0.3–1.2)

## 2018-07-09 LAB — MAGNESIUM: Magnesium: 1.8 mg/dL (ref 1.7–2.4)

## 2018-07-09 LAB — GLUCOSE, CAPILLARY
Glucose-Capillary: 126 mg/dL — ABNORMAL HIGH (ref 70–99)
Glucose-Capillary: 112 mg/dL — ABNORMAL HIGH (ref 70–99)
Glucose-Capillary: 118 mg/dL — ABNORMAL HIGH (ref 70–99)
Glucose-Capillary: 143 mg/dL — ABNORMAL HIGH (ref 70–99)

## 2018-07-09 LAB — PHOSPHORUS: Phosphorus: 3.8 mg/dL (ref 2.5–4.6)

## 2018-07-09 LAB — HEMOGLOBIN A1C
Hgb A1c MFr Bld: 6.6 % — ABNORMAL HIGH (ref 4.8–5.6)
Mean Plasma Glucose: 142.72 mg/dL

## 2018-07-09 MED ORDER — POTASSIUM CHLORIDE CRYS ER 20 MEQ PO TBCR
40.0000 meq | EXTENDED_RELEASE_TABLET | Freq: Two times a day (BID) | ORAL | Status: AC
  Administered 2018-07-09 (×2): 40 meq via ORAL
  Filled 2018-07-09 (×2): qty 2

## 2018-07-09 MED ORDER — PERFLUTREN LIPID MICROSPHERE
INTRAVENOUS | Status: AC
  Administered 2018-07-09: 3.5 mL via INTRAVENOUS
  Filled 2018-07-09: qty 10

## 2018-07-09 MED ORDER — IPRATROPIUM-ALBUTEROL 0.5-2.5 (3) MG/3ML IN SOLN
3.0000 mL | Freq: Two times a day (BID) | RESPIRATORY_TRACT | Status: DC
  Administered 2018-07-09 – 2018-07-11 (×4): 3 mL via RESPIRATORY_TRACT
  Filled 2018-07-09 (×4): qty 3

## 2018-07-09 NOTE — Progress Notes (Signed)
  Echocardiogram 2D Echocardiogram has been performed.  Michele Owens 07/09/2018, 4:00 PM

## 2018-07-09 NOTE — Consult Note (Signed)
Advanced Heart Failure Team Consult Note   Primary Physician: [name], MD PCP-Cardiologist:  No primary care provider on file.  Reason for Consultation: CHF  HPI:    Michele Owens is seen today for evaluation of CHF at the request of Dr. Alfredia Ferguson.   58 yo with history of obesity, COPD, and diastolic CHF was admitted for dyspnea.  She was hypoxic and wheezing with lower leg edema and BP 200/100 in the ER.  No fever.  She has a history of diastolic CHF, last echo in 4/19 with EF 60-65% and moderate LVH.  She has severe COPD, has quit smoking. She uses home oxygen but not CPAP (says that sleep study > 1 year ago was negative for OSA).  At home, she follows a high sodium and fluid diet.  She only takes Lasix 20 mg daily.  CXR showed vascular congestion.   She has diuresed some since admission with IV Lasix.  I/Os -1528 net yesterday. Breathing is getting better but still short of breath walking to bathroom and orthopneic.  No chest pain.   Review of Systems: All systems reviewed and negative except as per HPI.   Home Medications Prior to Admission medications   Medication Sig Start Date End Date Taking? Authorizing Provider  albuterol (PROVENTIL HFA;VENTOLIN HFA) 108 (90 Base) MCG/ACT inhaler Inhale 1-2 puffs into the lungs every 6 (six) hours as needed for wheezing or shortness of breath. 04/23/18  Yes Long, [name], MD  allopurinol (ZYLOPRIM) 100 MG tablet Take 100 mg by mouth daily.   Yes [provider]  ANORO ELLIPTA 62.5-25 MCG/INH AEPB Inhale 1 puff into the lungs daily.  04/11/18  Yes [provider]  butalbital-acetaminophen-caffeine (FIORICET, ESGIC) 50-325-40 MG tablet Take 1-2 tablets by mouth every 4 (four) hours as needed for migraine. 06/13/18  Yes [provider]  calcitRIOL (ROCALTROL) 0.5 MCG capsule Take 1 capsule (0.5 mcg total) by mouth daily. 09/26/16  Yes Hongalgi, [name], MD  carvedilol (COREG) 6.25 MG tablet Take 1 tablet (6.25 mg  total) by mouth 2 (two) times daily with a meal. 01/23/18  Yes Bensimhon, [name], MD  cetirizine (ZYRTEC) 10 MG tablet Take 10 mg by mouth daily. 07/10/17  Yes [provider]  clonazePAM (KLONOPIN) 2 MG tablet Take 2 mg by mouth 3 (three) times daily. for anxiety 05/08/17  Yes [provider]  diclofenac sodium (VOLTAREN) 1 % GEL Apply 2 g topically 4 (four) times daily as needed (for pain). 06/19/18  Yes Smith, [name], MD  EPINEPHrine 0.3 mg/0.3 mL IJ SOAJ injection Inject 0.3 mg into the muscle daily as needed (allergic reaction).   Yes [provider]  FEROSUL 325 (65 Fe) MG tablet Take 325 mg by mouth daily. 03/22/17  Yes [provider]  furosemide (LASIX) 20 MG tablet Take 1 tablet (20 mg total) by mouth daily. 06/19/18  Yes [name], MD  HYDROcodone-acetaminophen (NORCO/VICODIN) 5-325 MG tablet Take 1 tablet by mouth every 6 (six) hours as needed for moderate pain.  02/12/18  Yes [provider]  ipratropium-albuterol (DUONEB) 0.5-2.5 (3) MG/3ML SOLN Take 3 mLs by nebulization 4 (four) times daily.  04/05/17  Yes [provider]  levothyroxine (SYNTHROID, LEVOTHROID) 112 MCG tablet Take 224 mcg by mouth daily. 07/11/17  Yes [provider]  lisinopril (PRINIVIL,ZESTRIL) 5 MG tablet Take 1 tablet (5 mg total) by mouth daily. Please call for office visit [phone] 08/15/17  Yes [name], MD  loperamide (IMODIUM) 2 MG capsule Take 1 capsule (2 mg total) by mouth 4 (four) times daily as needed for diarrhea or loose stools. 1/93/79  Yes [name], MD  metFORMIN (GLUCOPHAGE) 500 MG tablet Take 1 tablet (500 mg total) by mouth 2 (two) times daily with a meal. 12/11/14  Yes [name], MD  methocarbamol (ROBAXIN) 500 MG tablet Take 1 tablet (500 mg total) by mouth every 8 (eight) hours as needed for muscle spasms. 06/19/18  Yes [name], MD  metoCLOPramide (REGLAN) 10 MG tablet Take 1 tablet (10 mg total) by  mouth every 6 (six) hours as needed for nausea (or headache). 0/24/09  Yes [name], MD  nitroGLYCERIN (NITROSTAT) 0.4 MG SL tablet Place 1 tablet (0.4 mg total) under the tongue every 5 (five) minutes as needed for chest pain. 06/19/18  Yes [name], MD  nystatin cream (MYCOSTATIN) Apply 1 application topically 2 (two) times daily as needed for dry skin.  04/13/17  Yes [provider]  omeprazole (PRILOSEC) 40 MG capsule Take 40 mg by mouth daily. 04/13/15  Yes [provider]  OXYGEN Inhale 2 L into the lungs at bedtime.   Yes [provider]  potassium chloride SA (K-DUR,KLOR-CON) 20 MEQ tablet Take 2 tablets (40 mEq total) by mouth daily. Patient taking differently: Take 20 mEq by mouth daily.  08/01/16  Yes [name], MD  pravastatin (PRAVACHOL) 40 MG tablet Take 40 mg by mouth every evening.  03/29/15  Yes [provider]  QC STOOL SOFTENER PLS LAXATIVE 8.6-50 MG tablet Take 1 tablet by mouth at bedtime. 04/11/17  Yes [provider]  spironolactone (ALDACTONE) 100 MG tablet Take 100 mg by mouth daily. 10/02/17  Yes [provider]  tiotropium (SPIRIVA) 18 MCG inhalation capsule Place 18 mcg into inhaler and inhale daily.   Yes [provider]  Blood Glucose Monitoring Suppl (ACCU-CHEK AVIVA PLUS) w/Device KIT USE AS DIRECTED TO CHECK BLOOD SUGAR 08/07/17   [provider]  glucose blood test strip Use as instructed to check blood sugar up to 4 times daily 08/06/17   [name], DO  metoprolol succinate (TOPROL-XL) 25 MG 24 hr tablet Take 1 tablet (25 mg total) by mouth daily. Patient not taking: Reported on 07/04/2018 08/07/17   [name], DO    Past Medical History: 1. Back and hip pain 2. HTN 3. Obesity 4. GERD 5. Hyperlipidemia 6. Depression 7. COPD: Has quit smoking. Now on home oxygen but does not use all the time.  PFTs (3/17) with severe COPD.  8. 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 Event monitor (12/14) with only NSR noted.  10. Chest pain: Atypical.  ETT-Cardiolite (12/13) with EF 60%, no ischemia/infarction. Lexiscan Cardiolite (2/17) with small, moderate-intensity partially reversible inferolateral defect. LHC (3/17): No significant CAD. 11. Syncope: Uncertain etiology.   12. Sleep study negative for OSA in 5/17.  13. Gout 14. Migraines 15. Diabetes 16. Diastolic CHF: Echo (7/35) with EF 55-60%, grade II diastolic dysfunction.  - RHC (3/17) with mean RA 8, PA 45/20 mean 30, mean PCWP 20; PVR 1.5 WU.  - Echo (4/18): EF 45-50%, diffuse hypokinesis, grade II diastolic dysfunction, moderately dilated RV with moderately decreased systolic function.  - Echo (4/19): EF 60-65%, moderate LVH, normal RV.  17. Papillary thyroid carcinoma: Noted on biopsy 3/18.   Past Surgical History: Past Surgical History:  Procedure Laterality Date  . BUNIONECTOMY Bilateral   .  CARDIAC CATHETERIZATION N/A 06/23/2015   Procedure: Right/Left Heart Cath and Coronary Angiography;  Surgeon: [name], MD;  Location: [place] [facility];  Service: Cardiovascular;  Laterality: N/A;  . COLONOSCOPY WITH PROPOFOL N/A 12/15/2015   Procedure: COLONOSCOPY WITH PROPOFOL;  Surgeon: [name], MD;  Location: [place];  Service: Endoscopy;  Laterality: N/A;  . THYROIDECTOMY  09/19/2016  . THYROIDECTOMY N/A 09/19/2016   Procedure: TOTAL THYROIDECTOMY;  Surgeon: [name], MD;  Location: [place];  Service: General;  Laterality: N/A;  . TONSILLECTOMY    . TOTAL ABDOMINAL HYSTERECTOMY  07/14/10    Family History: Family History  Problem Relation Age of Onset  . Cancer Father        thought to be due to exposure to concrete  . Diabetes Mother   . Thyroid cancer Mother        dx in her 66s-60s  . Cancer Maternal Uncle        2 uncles with cancer NOS  . Brain cancer Paternal Aunt   . Cancer Cousin        maternal first cousin - NOS  . Cancer Cousin         maternal first cousin - NOS    Social History: Social History   Socioeconomic History  . Marital status: Single    Spouse name: Not on file  . Number of children: Not on file  . Years of education: Not on file  . Highest education level: Not on file  Occupational History  . Occupation: unemployeed  Social Needs  . Financial resource strain: Not on file  . Food insecurity:    Worry: Not on file    Inability: Not on file  . Transportation needs:    Medical: Not on file    Non-medical: Not on file  Tobacco Use  . Smoking status: Former Smoker    Packs/day: 0.50    Years: 41.00    Pack years: 20.50    Types: Cigarettes    Last attempt to quit: 07/2016    Years since quitting: 2.0  . Smokeless tobacco: Never Used  . Tobacco comment: Starting to Wean Off -- using Nicotine Patch  Substance and Sexual Activity  . Alcohol use: Not Currently    Alcohol/week: 0.0 standard drinks    Comment: ? none now  . Drug use: Never    Comment: none for long time  . Sexual activity: Yes  Lifestyle  . Physical activity:    Days per week: Not on file    Minutes per session: Not on file  . Stress: Not on file  Relationships  . Social connections:    Talks on phone: Not on file    Gets together: Not on file    Attends religious service: Not on file    Active member of club or organization: Not on file    Attends meetings of clubs or organizations: Not on file    Relationship status: Not on file  Other Topics Concern  . Not on file  Social History Narrative  . Not on file    Allergies:  Allergies  Allergen Reactions  . Bee Venom Swelling and Other (See Comments)    "All over my body" (swelling)  . Fioricet [Butalbital-Apap-Caffeine] Nausea And Vomiting and Rash  . Ibuprofen Rash and Other (See Comments)    Severe rash  . Lamisil [Terbinafine] Rash and Other (See Comments)    Pt states this causes her to "feel funny"  .  Nsaids Other (See Comments)    Per MD's orders      Objective:    Vital Signs:   Temp:  [97.5 F (36.4 C)-98.9 F (37.2 C)] 98.9 F (37.2 C) (03/31 1128) Pulse Rate:  [85-100] 88 (03/31 1128) Resp:  [18-20] 20 (03/31 1128) BP: (109-130)/(65-79) 130/79 (03/31 1128) SpO2:  [92 %-93 %] 92 % (03/31 1128) Weight:  [139 kg] 139 kg (03/31 0323) Last BM Date: 07/08/18  Weight change: Filed Weights   07/07/18 0122 07/08/18 0023 07/09/18 0323  Weight: (!) 140.6 kg (!) 140.4 kg (!) 139 kg    Intake/Output:   Intake/Output Summary (Last 24 hours) at 07/09/2018 1351 Last data filed at 07/09/2018 1343 Gross per 24 hour  Intake 904 ml  Output 2801 ml  Net -1897 ml      Physical Exam    General:  Obese. No resp difficulty HEENT: normal Neck: supple. JVP difficult, estimate 12. Carotids 2+ bilat; no bruits. No lymphadenopathy or thyromegaly appreciated. Cor: PMI nondisplaced. Regular rate & rhythm. No rubs, gallops or murmurs. Lungs: Distant BS.  Abdomen: soft, nontender, nondistended. No hepatosplenomegaly. No bruits or masses. Good bowel sounds. Extremities: no cyanosis, clubbing, rash. 1+ ankle edema.  Neuro: alert & orientedx3, cranial nerves grossly intact. moves all 4 extremities w/o difficulty. Affect pleasant   Telemetry   NSR (personally reviewed)  EKG    Sinus tachy 101, personally reviewed.   Labs   Basic Metabolic Panel: Recent Labs  Lab 07/05/18 0807 07/06/18 0409 07/07/18 0324 07/08/18 0546 07/09/18 0632  NA 139 139 138 137 138  K 4.1 3.8 3.7 3.8 3.5  CL 89* 89* 91* 90* 93*  CO2 37* 37* 33* 31 33*  GLUCOSE 105* 105* 104* 117* 118*  BUN '7 9 11 14 13  ' CREATININE 0.56 0.57 0.59 0.66 0.61  CALCIUM 8.7* 8.4* 8.3* 8.3* 8.2*  MG 1.8 2.0 1.9 1.9 1.8  PHOS 4.4 5.0* 5.5* 4.3 3.8    Liver Function Tests: Recent Labs  Lab 07/05/18 0807 07/06/18 0409 07/07/18 0324 07/08/18 0546 07/09/18 0632  AST '18 18 17 23 25  ' ALT '19 17 16 20 19  ' ALKPHOS 88 86 85 82 77  BILITOT 0.6 0.5 0.5 0.8 0.7  PROT 7.4 7.1 7.0  7.4 7.4  ALBUMIN 3.9 3.5 3.7 3.9 3.8   No results for input(s): LIPASE, AMYLASE in the last 168 hours. No results for input(s): AMMONIA in the last 168 hours.  CBC: Recent Labs  Lab 07/05/18 0807 07/06/18 0409 07/07/18 0324 07/08/18 0546 07/09/18 0632  WBC 12.6* 12.5* 11.4* 11.9* 12.6*  NEUTROABS 9.9* 9.6* 8.4* 8.7* 9.4*  HGB 12.0 11.9* 11.8* 12.5 11.7*  HCT 40.1 39.1 38.8 40.4 39.3  MCV 94.8 95.4 95.3 93.7 94.0  PLT 251 255 260 280 270    Cardiac Enzymes: No results for input(s): CKTOTAL, CKMB, CKMBINDEX, TROPONINI in the last 168 hours.  BNP: BNP (last 3 results) Recent Labs    06/09/18 2054 06/17/18 2326 07/04/18 0531  BNP 14.6 15.6 67.5    ProBNP (last 3 results) No results for input(s): PROBNP in the last 8760 hours.   CBG: Recent Labs  Lab 07/08/18 1141 07/08/18 1653 07/08/18 2109 07/09/18 0603 07/09/18 1129  GLUCAP 132* 130* 114* 126* 112*    Coagulation Studies: No results for input(s): LABPROT, INR in the last 72 hours.   Imaging   Dg Chest Port 1 View  Result Date: 07/09/2018 CLINICAL DATA:  Shortness of breath EXAM: PORTABLE CHEST 1  VIEW COMPARISON:  07/08/2018 FINDINGS: Cardiac shadow is enlarged but stable. Slight increase in vascular congestion is noted when compare with the prior exam. Stable bibasilar atelectatic changes are noted. Degenerative change of the thoracic spine is seen. IMPRESSION: Slight increase in the degree of vascular congestion. Stable bibasilar atelectasis. Electronically Signed   By: Inez Catalina M.D.   On: 07/09/2018 08:20      Medications:     Current Medications: . allopurinol  100 mg Oral Daily  . carvedilol  6.25 mg Oral BID WC  . clonazePAM  2 mg Oral TID  . diclofenac sodium  2 g Topical QID  . enoxaparin (LOVENOX) injection  70 mg Subcutaneous Daily  . furosemide  80 mg Intravenous BID  . guaiFENesin  1,200 mg Oral BID  . ipratropium-albuterol  3 mL Nebulization BID  . levothyroxine  224 mcg Oral  QAC breakfast  . lisinopril  5 mg Oral Daily  . loratadine  10 mg Oral Daily  . mouth rinse  15 mL Mouth Rinse BID  . metFORMIN  500 mg Oral BID WC  . montelukast  10 mg Oral QHS  . pantoprazole  40 mg Oral Daily  . polyethylene glycol  17 g Oral BID  . potassium chloride  40 mEq Oral BID  . pravastatin  40 mg Oral QPM  . senna-docusate  1 tablet Oral BID  . sodium chloride flush  3 mL Intravenous Q12H  . spironolactone  100 mg Oral Daily  . umeclidinium-vilanterol  1 puff Inhalation Daily     Infusions: . sodium chloride 250 mL (07/05/18 1057)        Assessment/Plan   1. Acute on chronic diastolic CHF: Echo in 0/10 with EF 60-65%, moderate LVH.  History of diastolic CHF, not taking much Lasix at home (only 20 mg daily).  Exam is difficult for volume but suspect she is still volume overloaded.  Creatinine stable with diuresis so far.  - Agree with increase in Lasix to 80 mg IV bid.  Volume status may be difficult to follow, for now would diurese until BUN starts increasing, may end up needing RHC to be sure.  - Repeat echo with CHF exacerbation.  - I think that she would be a good Cardiomems candidate, will need to work on this as outpatient.  2. COPD: Patient has history of severe COPD, wearing home oxygen.   No wheezing on exam today.  - Per primary service.  3. Diabetes: Continue current regimen.   Length of Stay: [facility], MD  07/09/2018, 1:51 PM  Advanced Heart Failure Team Pager [phone] (M-F; 7a - 4p)  Please contact [facility] for night-coverage after hours (4p -7a ) and weekends on [email or web address]

## 2018-07-09 NOTE — Progress Notes (Signed)
Patient giving herself a wash up.  Asked for lab to return in an hour.

## 2018-07-09 NOTE — Progress Notes (Signed)
PROGRESS NOTE    Michele Owens  JKK:[id number] DOB: 03-28-1961 DOA: 07/04/2018 PCP: [name], MD  Brief Narrative:  The patient is a 58 year old African-American super morbidly obese female with a past medical history significant for chronic diastolic CHF with a baseline EF of 60 to 65%, COPD Gold stage II-III on 2 L of home oxygen normally, diabetes mellitus type 2, hypertension, prior DVT, hypothyroidism, GERD, hyperlipidemia, gout other comorbidities who presented to the emergency room with a chief complaint of breath.  Recently patient was hospitalized from 3/9 to 06/19/2018 and has had a couple ED visits in between then her hospitalization was significant for acute on chronic diastolic CHF is felt her BNP was not a good indicator of her extra fluid status at that time.  She had a vascular ultrasound did not reveal a DVT of her lower swelling.  She returned to the ED with bilateral feet swelling on 323 and her O2 saturation was 70 and 80% and she was placed on BiPAP and then she was recently discharged from the hospital and been fairly well on her home health nurses did not show up for her and she admitted to dietary indiscretion by eating a lot of meat and not sticking to her Lasix normally.  EMS was called and she was placed on BiPAP to the emergency room for further evaluation and required 4 L of oxygen currently after being taken off oxygen now is back on 2 L.  She is being diuresed and is currently being admitted for acute on chronic diastolic CHF  Hospitalization has been complicated by left sided neck swelling along with right-sided leg pain. Still extremely volume overloaded so have been increasing Diuresis and was on IV Lasix 80 BID. Because worsening CXR this AM and continued Volume Overload, Heart Failure Team was consulted for further evaluation and recommendations.   Assessment & Plan:   Principal Problem:   Acute and chronic respiratory failure with hypercapnia (HCC) Active  Problems:   OSA (obstructive sleep apnea)   Dyslipidemia associated with type 2 diabetes mellitus (HCC)   Asthma with COPD with exacerbation (HCC)   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   HLD (hyperlipidemia)   Gout   DVT (deep vein thrombosis) in pregnancy   AKI (acute kidney injury) ([place])   Atypical chest pain   Fall at home, initial encounter   HNP (herniated nucleus pulposus), thoracic   CHF exacerbation (HCC)   Acute heart failure (HCC)  Acute on chronic respiratory failure with hypercapnia in the setting of type factorial causes including Chronic Combined Systolic and Diastolic CHF Exacerbation along with underlying COPD; Requiring at least 3 Liters of Supplemental O2 via [place] still  -Multifactorial causes of respiratory failure and has been weaned off of BiPAP now  -Continued with diuresis IV Lasix 80 mg BID -Coreg was held in the setting of acute diastolic CHF exacerbation but will resumed yesterday; She is not taking Metoprolol Succinate -Continue Lisinopril 5 mg po Daily  -Last ECHO in 2019 showed EF of 60-65% (improved from 45-50% previously) with Grade 1 DD (Previously had Grade 2) -Will consider repeating ECHOCardiogram if Cardiology department will allow; Defer to Cardiology to repeat  -Strict I's and O's and daily weights, Currently she is down 16 lbs and she is -8.588 Liters since admission -Fluid restriction to 1500 mL now reduced 1200 inadequate Diuresis  -Continue diuresis as above -CXR today showed "Cardiac shadow is enlarged but stable. Slight increase in vascular congestion is noted when  compare with the prior exam. Stable bibasilar atelectatic changes are noted. Degenerative change of the thoracic spine is seen." -Resumed Spironolactone 100 mg p.o. daily and will continue -Continue DuoNeb q6h and changed to TID  -Likely need a larger dose of Lasix at discharge as she was only taking 20 mg po Daily  -Continue supplemental oxygen via nasal cannula and  wean O2 as tolerated to home dose -Continuous pulse oximetry and maintain O2 saturations greater than 90%" -Would Like to repeat ECHO but not emergent and would likely be cancelled by Cardiology Department given concern for the COVID Pandemic; Cardiology consulted and will defer to them to Repeat ECHO  -We will need home Ambulatory screen prior to discharge to assess for O2 and she Desaturated significantly yesterday  -Needs Significant Heart Failure and COPD education; Patient has not been adherent to low salt diet, fluid restriction, along with Education about her COPD -Because she had slow Diuresis Heart Failure Team/Cardiology was consulted for further evaluation and recommendations and patient will be given 2.5 mg of Metolazone as well.   COPD Gold stage II-III on home oxygen -Oxygen requirements have decreased significantly she is now on 3 L and will wean to Home O2; ? Unclear how many liters she has been on -Patient apparently turns down her Home O2 depending on her Blood Sugar and I told her that her blood sugar readings had no bearing on her O2 Requirements or Hypoxia  -Continue inhalers-she does not need stepdown and was transferred to telemetry yesterday -Continue with DuoNeb 3 mils q6h and transitioned to TID -C/w Anora Ellipta -Changed Guaifenesin 100 mg po q4hprn Cough to Guaifenesin 1200 mg po BID -Continue with supplemental oxygen via nasal cannula as above -Currently holding Spiriva as she is on duo nebs -Chest x-ray as above  -Send Sputum Cx but sputum sample was unacceptable so will need repeat   History multinodular goiter and Thyroid cancer s/p Thyroidectomy -Continue Synthroid 224 daily -TSH in 3 weeks-low suspicion this is related to thyroid related illness  Right Leg Pain/Hip Pain -Pain control as below -Added Diclofenac Gel and wil continue  -Obtained X-Ray of the Hip and Femur;  -Femur X-Ray showed There is no evidence of fracture or other focal bone lesions. Soft  tissues are unremarkable. -Hip X-Ray showed There is no evidence of acute hip fracture or dislocation. The bony pelvis is intact without evidence of fracture or diastasis. 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 -PT/OT to evaluate and Treat and recommending no OT follow up with [facility] -C/w Medications a below   Left Sided Neck Swelling -Has a Hx of of Thyroidectomy -Obtain Neck X-Ray and U/S -U/S of the neck showed Unremarkable neck ultrasound demonstrating no evidence of lymphadenopathy or abnormal soft tissue mass.  Diabetes Mellitus Type 2 -Continue Metformin 500 twice bid -CBG's ranging from 119-177 -As hemoglobin A1c was 5.9 back in June 2018 I will repeat this visit -Continue to monitor blood sugars carefully and if necessary will add Novolog sensitive sliding scale insulin before meals  Constipation, improved  -Holding loperamide and Reglan as unclear which is which -She states she needs to use the bathroom once a daily "because 1 medication causes her to use the bathroom" -Started the patient on senna docusate 1 tab p.o. twice daily, MiraLAX 17 g p.o. twice daily, and added Bisacodyl 10 mg rectal suppositories as needed  GERD -C/w Pantoprazole 40 Daily  Chronic pain from lumbago and prior back surgery -Continue with Hydrocodone-Acetaminophen but increased to  1-2 tab p.o. every 6 PRN for moderate pain -Added Diclofenac Gel as above   Super Morbid Obesity -Estimated body mass index is 50.99 kg/m as calculated from the following:   Height as of this encounter: 5\' 5"  (1.651 m).   Weight as of this encounter: 139 kg. -Weight Loss and Dietary Counseling   Leukocytosis -Patient's WBC was 12.9 on admission and repeat was 11.4 and now slightly trending up to 11.9 -> 12.6 -Likely reactive and currently afebrile; ? Related to Pain -Continue to Monitor for S/Sx of Infection -Repeat CBC in AM  Normocytic Anemia -Patient's Hgb/Hct was 10.9/37.6 on admission and repeat now  is 11.7/39.3 -Checked Anemia Panel and showed iron level of 64, U IBC of 300, TIBC of 364, saturation ratios of 18%, ferritin level of 92, folate level 9.2, and vitamin B12 level 473 -Continue to Monitor for S/Sx of Bleeding -Repeat CBC in AM   Depression and Anxiety -Continue Clonazepam 2 mg po TID  Hx of Gout -C/w Allopurinol 100 mg po Daily  HLD -C/w Pravastatin 40 mg po Daily   pHTN -C/w Diuresis as above  Obesity Hypoventilation Syndrome -Needs Outpatient Sleep Study -Will place on CPAP qHS  Hyperphosphatemia -Patient's phosphorus level is now 3.8 -Continue to monitor and repeat phosphorus level in the a.m.  DVT prophylaxis: Enoxaparin 40 mg sq q24h to be increased to dose of 0.5 mg/kg/d as BMI >30 Code Status: FULL CODE Family Communication: No Family present at bedside  Disposition Plan: Anticipate D/C when Back to Baseline weight and no longer getting IV Diuresis and Respiratory Status improves; Still remains significantly volume overloaded and Hypoxic without supplemental O2 via [place]  Consultants:   Cardiology    Procedures: U/S of the Neck  Will consider repeating ECHOCardiogram if Cardiology Deparment will allow   Antimicrobials:  Anti-infectives (From admission, onward)   None     Subjective: Seen and Examined at bedside and hoping to go home today but I discussed with her that her chest x-ray looked worse and she is extremely volume overloaded still.  And also discussed with her that I called cardiology because of her slow diuresis and she became very tearful and upset.  No nausea or vomiting.  No other concerns or complaints at this time and was withdrawn after discussing worsening heart failure with her.   Objective: Vitals:   07/08/18 2111 07/09/18 0323 07/09/18 0338 07/09/18 1128  BP:   109/65 130/79  Pulse:   85 88  Resp:   18 20  Temp:   (!) 97.5 F (36.4 C) 98.9 F (37.2 C)  TempSrc:   Oral Oral  SpO2: 92%  92% 92%  Weight:  (!) 139 kg     Height:        Intake/Output Summary (Last 24 hours) at 07/09/2018 1337 Last data filed at 07/09/2018 1252 Gross per 24 hour  Intake 664 ml  Output 2600 ml  Net -1936 ml   Filed Weights   07/07/18 0122 07/08/18 0023 07/09/18 0323  Weight: (!) 140.6 kg (!) 140.4 kg (!) 139 kg   Examination: Physical Exam:  Constitutional: Nourished, well-developed super morbidly obese African-American female currently no acute distress and became very tearful after I spoke with her about her heart failure and slow diuresis Eyes: Conjunctive are normal.  Sclera anicteric ENMT: External ears nose appear normal.  Mucous members are moist Neck: Appears supple with JVD status difficult to assess due to her body habitus.  Respiratory: Diminished auscultation bilaterally no appreciable  wheezing but does have coarse breath sounds and some diffuse crackles.  She has unlabored breathing and is wearing supplemental oxygen via nasal cannula Cardiovascular: Regular rate and rhythm.  No appreciable murmurs, rubs, gallops.  Has 2+ pitting edema still. Abdomen: Soft, nontender, distended secondary body habitus.  Bowel sounds present GU: Deferred Musculoskeletal: No contractures or cyanosis.  No joint deformities upper lower extremities Skin: Is warm and dry no appreciable rashes or lesions on limited skin evaluation Neurologic: Cranial Nerves II through XII grossly intact no appreciable focal deficits.  Romberg sign cerebellar reflexes were not assessed Psychiatric: Full mood and depressed affect.  She is awake, alert and oriented x3.  Not acting histrionic today.  Data Reviewed: I have personally reviewed following labs and imaging studies  CBC: Recent Labs  Lab 07/05/18 0807 07/06/18 0409 07/07/18 0324 07/08/18 0546 07/09/18 0632  WBC 12.6* 12.5* 11.4* 11.9* 12.6*  NEUTROABS 9.9* 9.6* 8.4* 8.7* 9.4*  HGB 12.0 11.9* 11.8* 12.5 11.7*  HCT 40.1 39.1 38.8 40.4 39.3  MCV 94.8 95.4 95.3 93.7 94.0  PLT 251 255  260 280 101   Basic Metabolic Panel: Recent Labs  Lab 07/05/18 0807 07/06/18 0409 07/07/18 0324 07/08/18 0546 07/09/18 0632  NA 139 139 138 137 138  K 4.1 3.8 3.7 3.8 3.5  CL 89* 89* 91* 90* 93*  CO2 37* 37* 33* 31 33*  GLUCOSE 105* 105* 104* 117* 118*  BUN 7 9 11 14 13   CREATININE 0.56 0.57 0.59 0.66 0.61  CALCIUM 8.7* 8.4* 8.3* 8.3* 8.2*  MG 1.8 2.0 1.9 1.9 1.8  PHOS 4.4 5.0* 5.5* 4.3 3.8   GFR: Estimated Creatinine Clearance: 108.7 mL/min (by C-G formula based on SCr of 0.61 mg/dL). Liver Function Tests: Recent Labs  Lab 07/05/18 0807 07/06/18 0409 07/07/18 0324 07/08/18 0546 07/09/18 0632  AST 18 18 17 23 25   ALT 19 17 16 20 19   ALKPHOS 88 86 85 82 77  BILITOT 0.6 0.5 0.5 0.8 0.7  PROT 7.4 7.1 7.0 7.4 7.4  ALBUMIN 3.9 3.5 3.7 3.9 3.8   No results for input(s): LIPASE, AMYLASE in the last 168 hours. No results for input(s): AMMONIA in the last 168 hours. Coagulation Profile: No results for input(s): INR, PROTIME in the last 168 hours. Cardiac Enzymes: No results for input(s): CKTOTAL, CKMB, CKMBINDEX, TROPONINI in the last 168 hours. BNP (last 3 results) No results for input(s): PROBNP in the last 8760 hours. HbA1C: Recent Labs    07/09/18 0632  HGBA1C 6.6*   CBG: Recent Labs  Lab 07/08/18 1141 07/08/18 1653 07/08/18 2109 07/09/18 0603 07/09/18 1129  GLUCAP 132* 130* 114* 126* 112*   Lipid Profile: No results for input(s): CHOL, HDL, LDLCALC, TRIG, CHOLHDL, LDLDIRECT in the last 72 hours. Thyroid Function Tests: No results for input(s): TSH, T4TOTAL, FREET4, T3FREE, THYROIDAB in the last 72 hours. Anemia Panel: Recent Labs    07/08/18 0546  VITAMINB12 473  FOLATE 9.2  FERRITIN 92  TIBC 364  IRON 64  RETICCTPCT 3.6*   Sepsis Labs: No results for input(s): PROCALCITON, LATICACIDVEN in the last 168 hours.  Recent Results (from the past 240 hour(s))  Expectorated sputum assessment w rflx to resp cult     Status: None   Collection Time:  07/07/18 10:03 AM  Result Value Ref Range Status   Specimen Description EXPECTORATED SPUTUM  Final   Special Requests NONE  Final   Sputum evaluation   Final    Sputum specimen not acceptable for  testing.  Please recollect.   CRITICAL RESULT CALLED TO, READ BACK BY AND VERIFIED WITH: RN Dorrene German [id number] AT 31 BY CM Performed at [facility], [place] [address] [place], [place] [address]    Report Status 07/07/2018 FINAL  Final     Radiology Studies: Dg Chest Port 1 View  Result Date: 07/09/2018 CLINICAL DATA:  Shortness of breath EXAM: PORTABLE CHEST 1 VIEW COMPARISON:  07/08/2018 FINDINGS: Cardiac shadow is enlarged but stable. Slight increase in vascular congestion is noted when compare with the prior exam. Stable bibasilar atelectatic changes are noted. Degenerative change of the thoracic spine is seen. IMPRESSION: Slight increase in the degree of vascular congestion. Stable bibasilar atelectasis. Electronically Signed   By: Inez Catalina M.D.   On: 07/09/2018 08:20   Dg Chest Port 1 View  Result Date: 07/08/2018 CLINICAL DATA:  Shortness of breath, history chronic diastolic CHF, COPD, diabetes mellitus, hypertension, thyroid cancer EXAM: PORTABLE CHEST 1 VIEW COMPARISON:  Portable exam 0708 hours compared to 07/07/2018 FINDINGS: Enlargement of cardiac silhouette with pulmonary vascular congestion. Mediastinal contours normal with atherosclerotic calcification aorta noted. Bibasilar atelectasis decreased since previous exam. Upper lungs clear. No pleural effusion or pneumothorax. Surgical clips in the cervical region bilaterally likely reflect prior thyroidectomy. IMPRESSION: Enlargement of cardiac silhouette with pulmonary vascular congestion. Decreased bibasilar atelectasis. Electronically Signed   By: Lavonia Dana M.D.   On: 07/08/2018 08:14   Scheduled Meds: . allopurinol  100 mg Oral Daily  . carvedilol  6.25 mg Oral BID WC  . clonazePAM  2 mg Oral TID  . diclofenac sodium  2 g  Topical QID  . enoxaparin (LOVENOX) injection  70 mg Subcutaneous Daily  . furosemide  80 mg Intravenous BID  . guaiFENesin  1,200 mg Oral BID  . ipratropium-albuterol  3 mL Nebulization BID  . levothyroxine  224 mcg Oral QAC breakfast  . lisinopril  5 mg Oral Daily  . loratadine  10 mg Oral Daily  . mouth rinse  15 mL Mouth Rinse BID  . metFORMIN  500 mg Oral BID WC  . montelukast  10 mg Oral QHS  . pantoprazole  40 mg Oral Daily  . polyethylene glycol  17 g Oral BID  . potassium chloride  40 mEq Oral BID  . pravastatin  40 mg Oral QPM  . senna-docusate  1 tablet Oral BID  . sodium chloride flush  3 mL Intravenous Q12H  . spironolactone  100 mg Oral Daily  . umeclidinium-vilanterol  1 puff Inhalation Daily   Continuous Infusions: . sodium chloride 250 mL (07/05/18 1057)    LOS: 5 days    Kerney Elbe, DO [facility] PAGER is on [place]  If 7PM-7AM, please contact night-coverage [email or web address] Password [facility] 07/09/2018, 1:37 PM

## 2018-07-10 DIAGNOSIS — Z6841 Body Mass Index (BMI) 40.0 and over, adult: Secondary | ICD-10-CM

## 2018-07-10 DIAGNOSIS — I5033 Acute on chronic diastolic (congestive) heart failure: Secondary | ICD-10-CM

## 2018-07-10 LAB — COMPREHENSIVE METABOLIC PANEL
ALT: 19 U/L (ref 0–44)
AST: 21 U/L (ref 15–41)
Albumin: 3.8 g/dL (ref 3.5–5.0)
Alkaline Phosphatase: 76 U/L (ref 38–126)
Anion gap: 10 (ref 5–15)
BUN: 12 mg/dL (ref 6–20)
CO2: 33 mmol/L — ABNORMAL HIGH (ref 22–32)
Calcium: 8.3 mg/dL — ABNORMAL LOW (ref 8.9–10.3)
Chloride: 93 mmol/L — ABNORMAL LOW (ref 98–111)
Creatinine, Ser: 0.7 mg/dL (ref 0.44–1.00)
GFR calc Af Amer: >60 mL/min (ref >60)
Glucose, Bld: 114 mg/dL — ABNORMAL HIGH (ref 70–99)
Potassium: 4.4 mmol/L (ref 3.5–5.1)
Sodium: 136 mmol/L (ref 135–145)
Total Bilirubin: 0.6 mg/dL (ref 0.3–1.2)
Total Protein: 7.7 g/dL (ref 6.5–8.1)

## 2018-07-10 LAB — CBC WITH DIFFERENTIAL/PLATELET
Abs Immature Granulocytes: 0.09 10*3/uL — ABNORMAL HIGH (ref 0.00–0.07)
Basophils Absolute: 0 10*3/uL (ref 0.0–0.1)
Basophils Relative: 0 %
Eosinophils Absolute: 0.3 10*3/uL (ref 0.0–0.5)
Eosinophils Relative: 2 %
HCT: 39.4 % (ref 36.0–46.0)
Hemoglobin: 12.1 g/dL (ref 12.0–15.0)
IMMATURE GRANULOCYTES: 1 %
Lymphocytes Relative: 14 %
Lymphs Abs: 1.7 10*3/uL (ref 0.7–4.0)
MCH: 29.1 pg (ref 26.0–34.0)
MCHC: 30.7 g/dL (ref 30.0–36.0)
MCV: 94.7 fL (ref 80.0–100.0)
Monocytes Absolute: 0.7 10*3/uL (ref 0.1–1.0)
Monocytes Relative: 6 %
NEUTROS ABS: 9 10*3/uL — AB (ref 1.7–7.7)
Neutrophils Relative %: 77 %
Platelets: 265 10*3/uL (ref 150–400)
RBC: 4.16 MIL/uL (ref 3.87–5.11)
RDW: 13.2 % (ref 11.5–15.5)
WBC: 11.8 10*3/uL — ABNORMAL HIGH (ref 4.0–10.5)
nRBC: 0 % (ref 0.0–0.2)

## 2018-07-10 LAB — GLUCOSE, CAPILLARY
Glucose-Capillary: 114 mg/dL — ABNORMAL HIGH (ref 70–99)
Glucose-Capillary: 120 mg/dL — ABNORMAL HIGH (ref 70–99)
Glucose-Capillary: 174 mg/dL — ABNORMAL HIGH (ref 70–99)

## 2018-07-10 LAB — PHOSPHORUS: PHOSPHORUS: 5.1 mg/dL — AB (ref 2.5–4.6)

## 2018-07-10 LAB — MAGNESIUM: MAGNESIUM: 2 mg/dL (ref 1.7–2.4)

## 2018-07-10 MED ORDER — SODIUM CHLORIDE 0.9% FLUSH
3.0000 mL | Freq: Two times a day (BID) | INTRAVENOUS | Status: DC
  Administered 2018-07-10: 3 mL via INTRAVENOUS

## 2018-07-10 MED ORDER — SODIUM CHLORIDE 0.9 % IV SOLN: 250.0000 mL | INTRAVENOUS | Status: DC | PRN

## 2018-07-10 MED ORDER — SODIUM CHLORIDE 0.9% FLUSH: 3.0000 mL | INTRAVENOUS | Status: DC | PRN

## 2018-07-10 MED ORDER — LIDOCAINE 5 % EX PTCH
1.0000 | MEDICATED_PATCH | CUTANEOUS | Status: DC
  Administered 2018-07-10: 1 via TRANSDERMAL
  Filled 2018-07-10 (×2): qty 1

## 2018-07-10 MED ORDER — SODIUM CHLORIDE 0.9 % IV SOLN
INTRAVENOUS | Status: DC
  Administered 2018-07-11: 06:00:00 via INTRAVENOUS

## 2018-07-10 NOTE — Progress Notes (Signed)
Patient ID: Michele Owens, female   DOB: [birth year], 59 y.o.   MRN: [id number]     Advanced Heart Failure Rounding Note  PCP-Cardiologist: No primary care provider on file.   Subjective:    Still feels more short of breath than baseline.  Diuresed reasonably yesterday, weight down 2 lbs.   Echo: EF 60-65%, normal RV function with mild enlargement, respiratory septal variation (?advanced pulmonary disease versus constriction).    Objective:   Weight Range: (!) 138.3 kg Body mass index is 50.72 kg/m.   Vital Signs:   Temp:  [98 F (36.7 C)-98.9 F (37.2 C)] 98.6 F (37 C) (04/01 0658) Pulse Rate:  [87-89] 89 (04/01 0831) Resp:  [18-20] 18 (04/01 0831) BP: (101-130)/(71-79) 101/71 (04/01 0658) SpO2:  [92 %-99 %] 93 % (04/01 0835) FiO2 (%):  [28 %] 28 % (04/01 0835) Weight:  [138.3 kg] 138.3 kg (04/01 0643) Last BM Date: 07/09/18  Weight change: Filed Weights   07/08/18 0023 07/09/18 0323 07/10/18 0643  Weight: (!) 140.4 kg (!) 139 kg (!) 138.3 kg    Intake/Output:   Intake/Output Summary (Last 24 hours) at 07/10/2018 0840 Last data filed at 07/10/2018 0542 Gross per 24 hour  Intake 960 ml  Output 2451 ml  Net -1491 ml      Physical Exam    General:  Obese. No resp difficulty HEENT: Normal Neck: Supple. JVP difficult but appears elevated. Carotids 2+ bilat; no bruits. No lymphadenopathy or thyromegaly appreciated. Cor: PMI nondisplaced. Regular rate & rhythm. No rubs, gallops or murmurs. Lungs: Distant BS.  Abdomen: Soft, nontender, nondistended. No hepatosplenomegaly. No bruits or masses. Good bowel sounds. Extremities: No cyanosis, clubbing, rash, edema Neuro: Alert & orientedx3, cranial nerves grossly intact. moves all 4 extremities w/o difficulty. Affect pleasant   Telemetry   NSR, personally reviewed.   Labs    CBC Recent Labs    07/09/18 0632 07/10/18 0523  WBC 12.6* 11.8*  NEUTROABS 9.4* 9.0*  HGB 11.7* 12.1  HCT 39.3 39.4  MCV 94.0 94.7   PLT 270 810   Basic Metabolic Panel Recent Labs    07/09/18 0632 07/10/18 0523  NA 138 136  K 3.5 4.4  CL 93* 93*  CO2 33* 33*  GLUCOSE 118* 114*  BUN 13 12  CREATININE 0.61 0.70  CALCIUM 8.2* 8.3*  MG 1.8 2.0  PHOS 3.8 5.1*   Liver Function Tests Recent Labs    07/09/18 0632 07/10/18 0523  AST 25 21  ALT 19 19  ALKPHOS 77 76  BILITOT 0.7 0.6  PROT 7.4 7.7  ALBUMIN 3.8 3.8   No results for input(s): LIPASE, AMYLASE in the last 72 hours. Cardiac Enzymes No results for input(s): CKTOTAL, CKMB, CKMBINDEX, TROPONINI in the last 72 hours.  BNP: BNP (last 3 results) Recent Labs    06/09/18 2054 06/17/18 2326 07/04/18 0531  BNP 14.6 15.6 67.5    ProBNP (last 3 results) No results for input(s): PROBNP in the last 8760 hours.   D-Dimer No results for input(s): DDIMER in the last 72 hours. Hemoglobin A1C Recent Labs    07/09/18 0632  HGBA1C 6.6*   Fasting Lipid Panel No results for input(s): CHOL, HDL, LDLCALC, TRIG, CHOLHDL, LDLDIRECT in the last 72 hours. Thyroid Function Tests No results for input(s): TSH, T4TOTAL, T3FREE, THYROIDAB in the last 72 hours.  Invalid input(s): FREET3  Other results:   Imaging     No results found.   Medications:     Scheduled Medications: .  allopurinol  100 mg Oral Daily  . carvedilol  6.25 mg Oral BID WC  . clonazePAM  2 mg Oral TID  . diclofenac sodium  2 g Topical QID  . enoxaparin (LOVENOX) injection  70 mg Subcutaneous Daily  . furosemide  80 mg Intravenous BID  . guaiFENesin  1,200 mg Oral BID  . ipratropium-albuterol  3 mL Nebulization BID  . levothyroxine  224 mcg Oral QAC breakfast  . lisinopril  5 mg Oral Daily  . loratadine  10 mg Oral Daily  . mouth rinse  15 mL Mouth Rinse BID  . metFORMIN  500 mg Oral BID WC  . montelukast  10 mg Oral QHS  . pantoprazole  40 mg Oral Daily  . polyethylene glycol  17 g Oral BID  . pravastatin  40 mg Oral QPM  . senna-docusate  1 tablet Oral BID  . sodium  chloride flush  3 mL Intravenous Q12H  . spironolactone  100 mg Oral Daily  . umeclidinium-vilanterol  1 puff Inhalation Daily     Infusions: . sodium chloride 250 mL (07/05/18 1057)     PRN Medications:  sodium chloride, acetaminophen, bisacodyl, HYDROcodone-acetaminophen, methocarbamol, nitroGLYCERIN, ondansetron (ZOFRAN) IV, sodium chloride flush   Assessment/Plan   1. Acute on chronic diastolic CHF: Echo this admission with EF 60-65%, mildly dilated RV with normal systolic function, respiratory variation of the IV septum => most likely due to severe pulmonary disease, less likely constrictive pericarditis (normal-appearing pericardium on CT chest in 3/20).  History of diastolic CHF, not taking much Lasix at home (only 20 mg daily).  Exam is difficult for volume but suspect she is still volume overloaded.  Creatinine stable with diuresis so far.  - Continue Lasix 80 mg IV bid.   - I think that she will need RHC to make sure volume status is optimized given difficult exam, will plan to do this tomorrow. Discussed risks/benefits with patient and she agrees.   - I think that she would be a good Cardiomems candidate, will need to work on this as outpatient.  2. COPD: Patient has history of severe COPD, wearing home oxygen.   No wheezing on exam today.  - Per primary service.  3. Diabetes: Continue current regimen.   Length of Stay: [place], MD  07/10/2018, 8:40 AM  Advanced Heart Failure Team Pager [phone] (M-F; [place])  Please contact [facility] for night-coverage after hours (4p -7a ) and weekends on [email or web address]

## 2018-07-10 NOTE — Progress Notes (Signed)
PT does not have aspirin ordered for Cardiac cath in the AM. Cardiology has been paged.

## 2018-07-10 NOTE — Progress Notes (Signed)
PROGRESS NOTE  Michele Owens CHY:[id number] DOB: Mar 21, 1961 DOA: 07/04/2018 PCP: [name], MD  Brief Narrative:  The patient is a 58 year old African-American super morbidly obese female with a past medical history significant for chronic diastolic CHF with a baseline EF of 60 to 65%, COPD Gold stage II-III on 2 L of home oxygen normally, diabetes mellitus type 2, hypertension, prior DVT, hypothyroidism, GERD, hyperlipidemia, gout other comorbidities who presented to the emergency room with a chief complaint of breath.  Recently patient was hospitalized from 3/9 to 06/19/2018 and has had a couple ED visits in between then her hospitalization was significant for acute on chronic diastolic CHF is felt her BNP was not a good indicator of her extra fluid status at that time.  She had a vascular ultrasound did not reveal a DVT of her lower swelling.  She returned to the ED with bilateral feet swelling on 323 and her O2 saturation was 70 and 80% and she was placed on BiPAP and then she was recently discharged from the hospital and been fairly well on her home health nurses did not show up for her and she admitted to dietary indiscretion by eating a lot of meat and not sticking to her Lasix normally.  EMS was called and she was placed on BiPAP to the emergency room for further evaluation and required 4 L of oxygen currently after being taken off oxygen now is back on 2 L.  She is being diuresed and is currently being admitted for acute on chronic diastolic CHF  Hospitalization has been complicated by left sided neck swelling along with right-sided leg pain. Still extremely volume overloaded so have been increasing Diuresis and was on IV Lasix 80 BID. Because worsening CXR this AM and continued Volume Overload, Heart Failure Team was consulted for further evaluation and recommendations.     HPI/Recap of past 24 hours:  C/o right thigh pain since accidental fall at home  Assessment/Plan:  Principal Problem:   Acute and chronic respiratory failure with hypercapnia (HCC) Active Problems:   OSA (obstructive sleep apnea)   Dyslipidemia associated with type 2 diabetes mellitus (HCC)   Asthma with COPD with exacerbation (HCC)   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   HLD (hyperlipidemia)   Gout   DVT (deep vein thrombosis) in pregnancy   AKI (acute kidney injury) ([place])   Atypical chest pain   Fall at home, initial encounter   HNP (herniated nucleus pulposus), thoracic   CHF exacerbation (HCC)   Acute heart failure (HCC)  Acute on chronic diastolic chf: -on coreg, lisinopril, spironolactone - iv lasix, volume status difficult to access , but bp and cr stable, good urine output Heart failure team consulted, will follow recommendation  HTN: bp stable on on coreg, lisinopril, spironolactone  COPD on home o2  No wheezing, no cough   Noninsulin dependent dm2,  a1c 6.6 Am glucose 114 D/c metformin, in anticipating right heart cath  Morbid obesity: Body mass index is 50.72 kg/m. reports negative OSA by sleep study done 93yr ago  History multinodular goiter and Thyroid cancer s/p Thyroidectomy -Continue Synthroid 224 daily -TSH in 3 weeks  Depression and Anxiety -Continue Clonazepam 2 mg po TID  Hx of Gout -C/w Allopurinol 100 mg po Daily  Right Leg Pain/Hip Pain after fall at home -Pain control as below -Added Diclofenac Gel and wil continue  -Obtained X-Ray of the Hip and Femur;  -Femur X-Ray showed There is no evidence of fracture or  other focal bone lesions. Soft tissues are unremarkable. -Hip X-Ray showed There is no evidence of acute hip fracture or dislocation. The bony pelvis is intact without evidence of fracture or diastasis. 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 -PT/OT to evaluate and Treat and recommending no OT follow up with [facility] -C/w Medications a below   Left Sided Neck Swelling -Has a Hx of of Thyroidectomy  -Obtain Neck X-Ray and U/S -U/S of the neck showed Unremarkable neck ultrasound demonstrating no evidence of lymphadenopathy or abnormal soft tissue mass.  Chronic leukocytosis: per chart review, at least since 2010, no prior lab available, will need to follow up with hematology   Code Status: full  Family Communication: patient   Disposition Plan: not ready to discharge   Consultants:  Cardiology/heart failure team   Procedures:  none  Antibiotics:  none   Objective: BP 101/71 (BP Location: Left Arm)   Pulse 89   Temp 98.6 F (37 C) (Oral)   Resp 18   Ht 5\' 5"  (1.651 m)   Wt (!) 138.3 kg Comment: scale a  SpO2 93%   BMI 50.72 kg/m   Intake/Output Summary (Last 24 hours) at 07/10/2018 1134 Last data filed at 07/10/2018 0542 Gross per 24 hour  Intake 960 ml  Output 2451 ml  Net -1491 ml   Filed Weights   07/08/18 0023 07/09/18 0323 07/10/18 0643  Weight: (!) 140.4 kg (!) 139 kg (!) 138.3 kg    Exam: Patient is examined daily including today on 07/10/2018, exams remain the same as of yesterday except that has changed    General:  NAD, obese  Cardiovascular: RRR  Respiratory: CTABL  Abdomen: Soft/ND/NT, positive BS  Musculoskeletal: No Edema  Neuro: alert, oriented   Data Reviewed: Basic Metabolic Panel: Recent Labs  Lab 07/06/18 0409 07/07/18 0324 07/08/18 0546 07/09/18 0632 07/10/18 0523  NA 139 138 137 138 136  K 3.8 3.7 3.8 3.5 4.4  CL 89* 91* 90* 93* 93*  CO2 37* 33* 31 33* 33*  GLUCOSE 105* 104* 117* 118* 114*  BUN 9 11 14 13 12   CREATININE 0.57 0.59 0.66 0.61 0.70  CALCIUM 8.4* 8.3* 8.3* 8.2* 8.3*  MG 2.0 1.9 1.9 1.8 2.0  PHOS 5.0* 5.5* 4.3 3.8 5.1*   Liver Function Tests: Recent Labs  Lab 07/06/18 0409 07/07/18 0324 07/08/18 0546 07/09/18 0632 07/10/18 0523  AST 18 17 23 25 21   ALT 17 16 20 19 19   ALKPHOS 86 85 82 77 76  BILITOT 0.5 0.5 0.8 0.7 0.6  PROT 7.1 7.0 7.4 7.4 7.7  ALBUMIN 3.5 3.7 3.9 3.8 3.8   No results  for input(s): LIPASE, AMYLASE in the last 168 hours. No results for input(s): AMMONIA in the last 168 hours. CBC: Recent Labs  Lab 07/06/18 0409 07/07/18 0324 07/08/18 0546 07/09/18 0632 07/10/18 0523  WBC 12.5* 11.4* 11.9* 12.6* 11.8*  NEUTROABS 9.6* 8.4* 8.7* 9.4* 9.0*  HGB 11.9* 11.8* 12.5 11.7* 12.1  HCT 39.1 38.8 40.4 39.3 39.4  MCV 95.4 95.3 93.7 94.0 94.7  PLT 255 260 280 270 265   Cardiac Enzymes:   No results for input(s): CKTOTAL, CKMB, CKMBINDEX, TROPONINI in the last 168 hours. BNP (last 3 results) Recent Labs    06/09/18 2054 06/17/18 2326 07/04/18 0531  BNP 14.6 15.6 67.5    ProBNP (last 3 results) No results for input(s): PROBNP in the last 8760 hours.  CBG: Recent Labs  Lab 07/08/18 2109 07/09/18 0603 07/09/18  1129 07/09/18 1605 07/09/18 2118  GLUCAP 114* 126* 112* 118* 143*    Recent Results (from the past 240 hour(s))  Expectorated sputum assessment w rflx to resp cult     Status: None   Collection Time: 07/07/18 10:03 AM  Result Value Ref Range Status   Specimen Description EXPECTORATED SPUTUM  Final   Special Requests NONE  Final   Sputum evaluation   Final    Sputum specimen not acceptable for testing.  Please recollect.   CRITICAL RESULT CALLED TO, READ BACK BY AND VERIFIED WITH: RN Dorrene German [id number] AT 70 BY CM Performed at [facility], [place] [address] [place], [place] [address]    Report Status 07/07/2018 FINAL  Final     Studies: No results found.  Scheduled Meds: . allopurinol  100 mg Oral Daily  . carvedilol  6.25 mg Oral BID WC  . clonazePAM  2 mg Oral TID  . diclofenac sodium  2 g Topical QID  . enoxaparin (LOVENOX) injection  70 mg Subcutaneous Daily  . furosemide  80 mg Intravenous BID  . guaiFENesin  1,200 mg Oral BID  . ipratropium-albuterol  3 mL Nebulization BID  . levothyroxine  224 mcg Oral QAC breakfast  . lisinopril  5 mg Oral Daily  . loratadine  10 mg Oral Daily  . mouth rinse  15 mL Mouth Rinse BID   . metFORMIN  500 mg Oral BID WC  . montelukast  10 mg Oral QHS  . pantoprazole  40 mg Oral Daily  . polyethylene glycol  17 g Oral BID  . pravastatin  40 mg Oral QPM  . senna-docusate  1 tablet Oral BID  . sodium chloride flush  3 mL Intravenous Q12H  . spironolactone  100 mg Oral Daily  . umeclidinium-vilanterol  1 puff Inhalation Daily    Continuous Infusions: . sodium chloride 250 mL (07/05/18 1057)     Time spent: 64mins I have personally reviewed and interpreted on  07/10/2018 daily labs, tele strips, imagings as discussed above under date review session and assessment and plans.  I reviewed all nursing notes, pharmacy notes, consultant notes,  vitals, pertinent old records  I have discussed plan of care as described above with RN , patient on 07/10/2018   Florencia Reasons MD, PhD  [facility] Pager [phone]. If 7PM-7AM, please contact night-coverage at [email or web address], password [facility] 07/10/2018, 11:34 AM  LOS: 6 days

## 2018-07-11 ENCOUNTER — Encounter (HOSPITAL_COMMUNITY)
Admission: EM | Disposition: A | Discharge status: Home Health | Payer: Self-pay | Source: Home / Self Care | Attending: Internal Medicine

## 2018-07-11 LAB — GLUCOSE, CAPILLARY
Glucose-Capillary: 125 mg/dL — ABNORMAL HIGH (ref 70–99)
Glucose-Capillary: 105 mg/dL — ABNORMAL HIGH (ref 70–99)

## 2018-07-11 LAB — BASIC METABOLIC PANEL
Anion gap: 10 (ref 5–15)
BUN: 15 mg/dL (ref 6–20)
CO2: 33 mmol/L — ABNORMAL HIGH (ref 22–32)
Calcium: 8.4 mg/dL — ABNORMAL LOW (ref 8.9–10.3)
Chloride: 92 mmol/L — ABNORMAL LOW (ref 98–111)
Creatinine, Ser: 0.64 mg/dL (ref 0.44–1.00)
GFR calc Af Amer: >60 mL/min (ref >60)
GFR calc non Af Amer: >60 mL/min (ref >60)
Glucose, Bld: 111 mg/dL — ABNORMAL HIGH (ref 70–99)
Potassium: 3.9 mmol/L (ref 3.5–5.1)
Sodium: 135 mmol/L (ref 135–145)

## 2018-07-11 SURGERY — RIGHT HEART CATH
Anesthesia: LOCAL

## 2018-07-11 MED ORDER — MONTELUKAST SODIUM 10 MG PO TABS: 10.0000 mg | ORAL_TABLET | Freq: Every day | ORAL | 0 refills | Status: DC

## 2018-07-11 MED ORDER — GUAIFENESIN ER 600 MG PO TB12: 1200.0000 mg | ORAL_TABLET | Freq: Two times a day (BID) | ORAL | 0 refills | Status: DC

## 2018-07-11 MED ORDER — POTASSIUM CHLORIDE CRYS ER 20 MEQ PO TBCR: 20.0000 meq | EXTENDED_RELEASE_TABLET | ORAL | 0 refills | Status: DC

## 2018-07-11 MED ORDER — FUROSEMIDE 20 MG PO TABS: ORAL_TABLET | ORAL | 0 refills | Status: DC

## 2018-07-11 MED ORDER — FUROSEMIDE 40 MG PO TABS
40.0000 mg | ORAL_TABLET | Freq: Every day | ORAL | Status: DC
  Administered 2018-07-11: 40 mg via ORAL
  Filled 2018-07-11: qty 1

## 2018-07-11 MED ORDER — FUROSEMIDE 20 MG PO TABS: 20.0000 mg | ORAL_TABLET | Freq: Every evening | ORAL | Status: DC

## 2018-07-11 MED ORDER — LIDOCAINE 5 % EX PTCH: 1.0000 | MEDICATED_PATCH | CUTANEOUS | 0 refills | Status: DC

## 2018-07-11 NOTE — Progress Notes (Signed)
Cardiology stated that pt does not need Asprin ordered for Right cardiac cath tom. Will continue to monitor.

## 2018-07-11 NOTE — TOC Transition Note (Signed)
Transition of Care [facility]) - CM/SW Discharge Note   Patient Details  Name: Michele Owens MRN: [id number] Date of Birth: [birth year]  Transition of Care [facility]) CM/SW Contact:  Sherrilyn Rist Phone Number: [phone] 07/11/2018, 11:54 AM  Clinical Narrative:    Patient now wants to continue using [facility] services provided by [facility]) at discharge; Dan with [facility] called for arrangements.   Final next level of care: [place] Barriers to Discharge: No Barriers Identified   Patient Goals and CMS Choice Patient states their goals for this hospitalization and ongoing recovery are:: to breathe better CMS Medicare.gov Compare Post Acute Care list provided to:: Patient Choice offered to / list presented to : Patient  Discharge Placement                       Discharge Plan and Services   Discharge Planning Services: CM Consult Post Acute Care Choice: Home Health          DME Arranged: N/A DME Agency: NA HH Arranged: RN, Nurse's Aide, Social Work [employer] Agency: Microbiologist ([place])   Social Determinants of Health ([place]) Interventions     Readmission Risk Interventions Readmission Risk Prevention Plan 07/05/2018  Transportation Screening Complete  Medication Review Press photographer) Referral to [place] Not Applicable  Some recent data might be hidden

## 2018-07-11 NOTE — Discharge Summary (Signed)
Discharge Summary  Michele Owens XKG:[id number] DOB: [birth year]  PCP: [name], MD  Admit date: 07/04/2018 Discharge date: 07/11/2018  Time spent: 28mns, more than 50% time spent on coordination of care.  Recommendations for Outpatient Follow-up:  1. F/u with PCP within a week  for hospital discharge follow up, repeat cbc/bmp at follow up 2. F/u with cardiology 3. Home health ordered ( RN for heart failure management ), continue home 02  Discharge Diagnoses:  Active Hospital Problems   Diagnosis Date Noted   Acute and chronic respiratory failure with hypercapnia (HCC)    Morbid obesity with BMI of 50.0-59.9, adult ([facility]    [place]F exacerbation ([place] 07/04/2018   Acute heart failure ([facility] 07/04/2018   Fall at home, initial encounter 06/23/2018   HNP (herniated nucleus pulposus), thoracic 06/23/2018   Acute on chronic diastolic CHF (congestive heart failure) ([facility]    Atypical chest pain    AKI (acute kidney injury) ([facility] 09/23/2016   DVT (deep vein thrombosis) in pregnancy 09/23/2016   Gout 09/23/2016   HLD (hyperlipidemia) 09/23/2016   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 01/23/2016   Asthma with COPD with exacerbation ([facility] 04/24/2015   Dyslipidemia associated with type 2 diabetes mellitus ([facility] 04/24/2015   OSA (obstructive sleep apnea) 05/10/2012    Resolved Hospital Problems  No resolved problems to display.    Discharge Condition: stable  Diet recommendation: heart healthy/carb modified  Filed Weights   07/09/18 0323 07/10/18 0643 07/11/18 0508  Weight: (!) 139 kg (!) 138.3 kg (!) 137.7 kg    History of present illness: (per admitting MD Dr SVerlon Au PCP: GElwyn Reach MD   Chief Complaint: Shortness of breath worsening hypoxia  HPI:  58year old African-American female BMI 51 Diastolic heart failure at baseline EF 60-65% 08/05/2017 COPD Gold stage II-III on 2 L of oxygen at baseline Diabetes mellitus type  2 HTN Prior DVT Hypothyroidism Reflux HLD Gout  Habitus concerning for OSA/OHS S  Recent hospital admission 3/9-3/11 2020 she has had a couple of ED visits Her hospitalization was significant for acute on chronic diastolic heart failure-it was felt that her BNP was not a good indicator of her actual fluid status or heart failure symptoms because of her BMI-vascular ultrasound did not reveal a DVT-at that admission she also had herniated disks after a fall at home  Returns to emergency room bilateral feet swelling starting 3/23-O2 sat 79-80%-placed on BiPAP-BP 210/100-wheeze  States since discharge recently from the hospital she has been doing fairly well but her home health aide/nurses have not shown up and she usually has someone to cook for her-she admits to dietary indiscretion eating "a lot of meat"-she also admits that she does not know the daily dose of her Lasix and has been taking Lasix until "she pees a lot" Baseline she is reasonably functional and has been on oxygen for an undetermined time through [place] not know how long She quit smoking maybe 5 to 10 years ago  She denies specifically any exposure to ill contacts "my doctor did not want anyone coming into my house because I have CHF hypertension and diabetes", she denies any diarrhea, she denies any chills, she denies any Reiger, she endorses no blurred or double vision she has no sore throat  In the field placed on BiPAP, came to emergency room and treatment was started By the time I saw her she was on 4 L of oxygen and had transferred according to nursing staff asked x2 from  bed to chair to pass urine     CXR = pulmonary edema-given Lasix-taken off BiPAP- Glucose 120 rest of be met normal White count 12.9 platelet 243 hemoglobin 10.9 BNP 67.5  ED Course: Albuterol x1, Klonopin x1, Lasix 401, Norco 10/650 x 1, taken off BiPAP   Hospital Course:  Principal Problem:   Acute and chronic respiratory  failure with hypercapnia (HCC) Active Problems:   OSA (obstructive sleep apnea)   Dyslipidemia associated with type 2 diabetes mellitus (HCC)   Asthma with COPD with exacerbation (HCC)   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   HLD (hyperlipidemia)   Gout   DVT (deep vein thrombosis) in pregnancy   AKI (acute kidney injury) ([place])   Atypical chest pain   Acute on chronic diastolic CHF (congestive heart failure) ([place])   Fall at home, initial encounter   HNP (herniated nucleus pulposus), thoracic   CHF exacerbation (HCC)   Acute heart failure ([place])   Morbid obesity with BMI of 50.0-59.9, adult (HCC)    Acute on chronic diastolic chf/ acute on chronic hypoxic respiratory failure: -on coreg, lisinopril, spironolactone at home which is continued  -she responded to iv lasix, volume status difficult to access , but bp and cr stable, good urine output, she is feeling better, o2 requirement back to baseline -Heart failure team consulted, initially planned right heart cath cancelled due to covid19 crisis, she is cleared to discharge home by cardiology with increased lasix, she is to follow up with cardiology to discuss right heart cath and cardiomems.   HTN: bp stable on on coreg, lisinopril, spironolactone  COPD on home o2  No wheezing, no cough Continue home meds   Noninsulin dependent dm2,  a1c 6.6 Am glucose 114 Continue home meds metformin  Morbid obesity: Body mass index is 50.72 kg/m. reports negative OSA by sleep study done 63yrago  History multinodular goiter and Thyroid cancer s/p Thyroidectomy -Continue Synthroid 224 daily -TSH in 3 weeks  Depression and Anxiety -Continue Clonazepam 2 mg po TID  Hx of Gout -C/w Allopurinol 100 mg po Daily  Right Leg Pain/Hip Pain after fall at home -Pain control as below -Added Diclofenac Gel and wil continue  -Obtained X-Ray of the Hip and Femur;  -Femur X-Ray showed There is no evidence of fracture or  other focal bone lesions. Soft tissues are unremarkable. -Hip X-Ray showed There is no evidence of acute hip fracture or dislocation. The bony pelvis is intact without evidence of fracture or diastasis. 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 -PT/OT to evaluate and Treat and recommending no OT follow up with [facility]-C/w Medications a below   Left Sided Neck Swelling -Has a Hx of of Thyroidectomy -Obtain Neck X-Ray and U/S -U/S of the neck showed Unremarkable neck ultrasound demonstrating no evidence of lymphadenopathy or abnormal soft tissue mass.  Chronic leukocytosis: per chart review, at least since 2010, no prior lab available, will benefit to follow up with hematology, defer to pcp.   Code Status: full  Family Communication: patient   Disposition Plan: home with cardiology clearance    Consultants:  Cardiology/heart failure team   Procedures:  none  Antibiotics:  none   Discharge Exam: BP 102/60 (BP Location: Left Arm)    Pulse 68    Temp 97.9 F (36.6 C) (Oral)    Resp 20    Ht '5\' 5"'  (1.651 m)    Wt (!) 137.7 kg    SpO2 92%  BMI 50.52 kg/m   General: NAD, obese  Cardiovascular: RRR Respiratory: CTABL  Discharge Instructions You were cared for by a hospitalist during your hospital stay. If you have any questions about your discharge medications or the care you received while you were in the hospital after you are discharged, you can call the unit and asked to speak with the hospitalist on call if the hospitalist that took care of you is not available. Once you are discharged, your primary care physician will handle any further medical issues. Please note that NO REFILLS for any discharge medications will be authorized once you are discharged, as it is imperative that you return to your primary care physician (or establish a relationship with a primary care physician if you do not have one) for your aftercare needs so that they can reassess your  need for medications and monitor your lab values.  Discharge Instructions    Diet - low sodium heart healthy   Complete by:  As directed    Increase activity slowly   Complete by:  As directed      Allergies as of 07/11/2018      Reactions   Bee Venom Swelling, Other (See Comments)   "All over my body" (swelling)   Fioricet [butalbital-apap-caffeine] Nausea And Vomiting, Rash   Ibuprofen Rash, Other (See Comments)   Severe rash   Lamisil [terbinafine] Rash, Other (See Comments)   Pt states this causes her to "feel funny"   Nsaids Other (See Comments)   Per MD's orders       Medication List    STOP taking these medications   metoprolol succinate 25 MG 24 hr tablet Commonly known as:  TOPROL-XL     TAKE these medications   Accu-Chek Aviva Plus w/Device Kit USE AS DIRECTED TO CHECK BLOOD SUGAR   albuterol 108 (90 Base) MCG/ACT inhaler Commonly known as:  PROVENTIL HFA;VENTOLIN HFA Inhale 1-2 puffs into the lungs every 6 (six) hours as needed for wheezing or shortness of breath.   allopurinol 100 MG tablet Commonly known as:  ZYLOPRIM Take 100 mg by mouth daily.   Anoro Ellipta 62.5-25 MCG/INH Aepb Generic drug:  umeclidinium-vilanterol Inhale 1 puff into the lungs daily.   butalbital-acetaminophen-caffeine 50-325-40 MG tablet Commonly known as:  FIORICET, ESGIC Take 1-2 tablets by mouth every 4 (four) hours as needed for migraine.   calcitRIOL 0.5 MCG capsule Commonly known as:  ROCALTROL Take 1 capsule (0.5 mcg total) by mouth daily.   carvedilol 6.25 MG tablet Commonly known as:  COREG Take 1 tablet (6.25 mg total) by mouth 2 (two) times daily with a meal.   cetirizine 10 MG tablet Commonly known as:  ZYRTEC Take 10 mg by mouth daily.   clonazePAM 2 MG tablet Commonly known as:  KLONOPIN Take 2 mg by mouth 3 (three) times daily. for anxiety   diclofenac sodium 1 % Gel Commonly known as:  VOLTAREN Apply 2 g topically 4 (four) times daily as needed (for  pain).   EPINEPHrine 0.3 mg/0.3 mL Soaj injection Commonly known as:  EPI-PEN Inject 0.3 mg into the muscle daily as needed (allergic reaction).   FeroSul 325 (65 FE) MG tablet Generic drug:  ferrous sulfate Take 325 mg by mouth daily.   furosemide 20 MG tablet Commonly known as:  LASIX Take 61m every morning and 270mevery evening What changed:    how much to take  how to take this  when to take this  additional instructions  glucose blood test strip Use as instructed to check blood sugar up to 4 times daily   guaiFENesin 600 MG 12 hr tablet Commonly known as:  MUCINEX Take 2 tablets (1,200 mg total) by mouth 2 (two) times daily.   HYDROcodone-acetaminophen 5-325 MG tablet Commonly known as:  NORCO/VICODIN Take 1 tablet by mouth every 6 (six) hours as needed for moderate pain.   ipratropium-albuterol 0.5-2.5 (3) MG/3ML Soln Commonly known as:  DUONEB Take 3 mLs by nebulization 4 (four) times daily.   levothyroxine 112 MCG tablet Commonly known as:  SYNTHROID, LEVOTHROID Take 224 mcg by mouth daily.   lidocaine 5 % Commonly known as:  LIDODERM Place 1 patch onto the skin daily. Remove & Discard patch within 12 hours or as directed by MD   lisinopril 5 MG tablet Commonly known as:  PRINIVIL,ZESTRIL Take 1 tablet (5 mg total) by mouth daily. Please call for office visit [phone]   loperamide 2 MG capsule Commonly known as:  IMODIUM Take 1 capsule (2 mg total) by mouth 4 (four) times daily as needed for diarrhea or loose stools.   metFORMIN 500 MG tablet Commonly known as:  Glucophage Take 1 tablet (500 mg total) by mouth 2 (two) times daily with a meal.   methocarbamol 500 MG tablet Commonly known as:  ROBAXIN Take 1 tablet (500 mg total) by mouth every 8 (eight) hours as needed for muscle spasms.   metoCLOPramide 10 MG tablet Commonly known as:  REGLAN Take 1 tablet (10 mg total) by mouth every 6 (six) hours as needed for nausea (or headache).    montelukast 10 MG tablet Commonly known as:  SINGULAIR Take 1 tablet (10 mg total) by mouth at bedtime.   nitroGLYCERIN 0.4 MG SL tablet Commonly known as:  NITROSTAT Place 1 tablet (0.4 mg total) under the tongue every 5 (five) minutes as needed for chest pain.   nystatin cream Commonly known as:  MYCOSTATIN Apply 1 application topically 2 (two) times daily as needed for dry skin.   omeprazole 40 MG capsule Commonly known as:  PRILOSEC Take 40 mg by mouth daily.   OXYGEN Inhale 2 L into the lungs at bedtime.   potassium chloride SA 20 MEQ tablet Commonly known as:  K-DUR,KLOR-CON Take 1 tablet (20 mEq total) by mouth every Monday, Wednesday, and Friday. Start taking on:  July 12, 2018 What changed:    how much to take  when to take this   pravastatin 40 MG tablet Commonly known as:  PRAVACHOL Take 40 mg by mouth every evening.   QC Stool Softener Pls Laxative 8.6-50 MG tablet Generic drug:  senna-docusate Take 1 tablet by mouth at bedtime.   spironolactone 100 MG tablet Commonly known as:  ALDACTONE Take 100 mg by mouth daily.   tiotropium 18 MCG inhalation capsule Commonly known as:  SPIRIVA Place 18 mcg into inhaler and inhale daily.      Allergies  Allergen Reactions   Bee Venom Swelling and Other (See Comments)    "All over my body" (swelling)   Fioricet [Butalbital-Apap-Caffeine] Nausea And Vomiting and Rash   Ibuprofen Rash and Other (See Comments)    Severe rash   Lamisil [Terbinafine] Rash and Other (See Comments)    Pt states this causes her to "feel funny"   Nsaids Other (See Comments)    Per MD's orders    Follow-up Information    [facility]   Specialty:  Home Health Services Why:  a home health Physical Therapist will continue to go to your home       [name], MD Follow up on 07/24/2018.   Specialty:  Cardiology Why:  1:40 with Dr Aundra Dubin for a telephone visit.  Contact information: [place] [address] [phone]        [name], MD Follow up.   Specialty:  Internal Medicine Why:  hospital discharge follow up Contact information: [place] [address] [phone]            The results of significant diagnostics from this hospitalization (including imaging, microbiology, ancillary and laboratory) are listed below for reference.    Significant Diagnostic Studies: Dg Neck Soft Tissue  Result Date: 07/06/2018 CLINICAL DATA:  Left neck swelling. History of prior thyroidectomy for thyroid carcinoma. EXAM: NECK SOFT TISSUES - 1+ VIEW COMPARISON:  Cervical spine films on 04/17/2017 FINDINGS: Frontal and lateral projections were obtained. Clips are present in the neck related to prior thyroidectomy. No obvious soft tissue findings. The visualized airway is normally patent. No prevertebral soft tissue swelling visualized in the upper cervical region. IMPRESSION: Unremarkable neck soft tissue x-rays. Electronically Signed   By: Aletta Edouard M.D.   On: 07/06/2018 14:59   Dg Chest 2 View  Result Date: 07/05/2018 CLINICAL DATA:  Shortness of breath.  History of thyroid carcinoma EXAM: CHEST - 2 VIEW COMPARISON:  July 04, 2018 FINDINGS: There is cardiomegaly with pulmonary venous hypertension. There is bibasilar interstitial edema. There is patchy atelectasis in the lung bases. No new opacity is evident compared to 1 day prior. No adenopathy. Postoperative changes noted in the region of the thyroid. There is degenerative change in the lower thoracic spine. IMPRESSION: Pulmonary vascular congestion with interstitial edema. There is felt to be a degree of underlying congestive heart failure. There is bibasilar atelectasis. No frank consolidation. No new opacity evident. Status post thyroidectomy. Electronically Signed   By: Lowella Grip III M.D.   On: 07/05/2018 07:48   Dg Thoracic Spine 2 View  Result Date:  06/17/2018 CLINICAL DATA:  58 y/o F; fall today with upper and lower back pain. EXAM: THORACIC SPINE 2 VIEWS; LUMBAR SPINE - COMPLETE 4+ VIEW COMPARISON:  None. FINDINGS: Thoracic spine: Twelve paired thoracic ribs. Surgical clips project over the lower neck. Multilevel bridging endplate osteophytes. Lumbar spine: Five lumbar vertebral bodies. No pars defect. Normal lumbar lordosis without listhesis. No loss of vertebral body or disc space height. Small multilevel endplate marginal osteophytes. IMPRESSION: 1. No acute fracture or malalignment. 2. Multilevel bridging endplate osteophytes of thoracic spine and mild discogenic degenerative changes of lumbar spine. Electronically Signed   By: Kristine Garbe M.D.   On: 06/17/2018 22:38   Dg Lumbar Spine Complete  Result Date: 06/17/2018 CLINICAL DATA:  58 y/o F; fall today with upper and lower back pain. EXAM: THORACIC SPINE 2 VIEWS; LUMBAR SPINE - COMPLETE 4+ VIEW COMPARISON:  None. FINDINGS: Thoracic spine: Twelve paired thoracic ribs. Surgical clips project over the lower neck. Multilevel bridging endplate osteophytes. Lumbar spine: Five lumbar vertebral bodies. No pars defect. Normal lumbar lordosis without listhesis. No loss of vertebral body or disc space height. Small multilevel endplate marginal osteophytes. IMPRESSION: 1. No acute fracture or malalignment. 2. Multilevel bridging endplate osteophytes of thoracic spine and mild discogenic degenerative changes of lumbar spine. Electronically Signed   By: Kristine Garbe M.D.   On: 06/17/2018 22:38   Dg Pelvis 1-2 Views  Result Date: 06/17/2018 CLINICAL DATA:  Pelvic pain after fall EXAM: PELVIS - 1-2 VIEW COMPARISON:  None. FINDINGS: No fracture or dislocation of the left hip. There is lucency of the medial aspect of the left inferior pubic ramus. No other osseous abnormality. IMPRESSION: 1. Lucency of the medial aspect of the left inferior pubic ramus is favored to be a composite shadow  artifact, though a minimally displaced fracture could also have this appearance. CT of the pelvis would be confirmatory. 2. No fracture or dislocation of the hips. Electronically Signed   By: Ulyses Jarred M.D.   On: 06/17/2018 22:42   Ct Pelvis Wo Contrast  Result Date: 06/17/2018 CLINICAL DATA:  Fall with pelvis trauma EXAM: CT PELVIS WITHOUT CONTRAST TECHNIQUE: Multidetector CT imaging of the pelvis was performed following the standard protocol without intravenous contrast. COMPARISON:  06/17/2018 radiograph, CT 06/06/2017 FINDINGS: Urinary Tract:  No abnormality visualized. Bowel:  Unremarkable visualized pelvic bowel loops. Vascular/Lymphatic: Nonaneurysmal.  No significant pelvic adenopathy Reproductive: Status post hysterectomy. No suspicious adnexal mass. Other:  Negative for pelvic effusion Musculoskeletal: No acute or suspicious abnormality IMPRESSION: No CT evidence for acute intrapelvic abnormality. Electronically Signed   By: Donavan Foil M.D.   On: 06/17/2018 23:48   Mr Thoracic Spine Wo Contrast  Result Date: 06/18/2018 CLINICAL DATA:  [place] at home on June 16, 2018. Mid back pain since that time. EXAM: MRI THORACIC SPINE WITHOUT CONTRAST TECHNIQUE: Multiplanar, multisequence MR imaging of the thoracic spine was performed. No intravenous contrast was administered. COMPARISON:  Chest CT 06/09/2018 FINDINGS: Alignment:  Normal Vertebrae: No acute fracture. No worrisome bone lesions. Small scattered hemangiomas are noted. Cord: Normal appearance of the thoracic spinal cord. No cord lesions or syrinx. Paraspinal and other soft tissues: No significant findings. Disc levels: Small focal right paracentral disc protrusion at T6-7 with mild impression on the right side of the thecal sac. Small to moderate-sized focal central disc protrusion at T9-10 with mild mass effect on the ventral thecal sac. Small right paracentral disc protrusion at T10-11 with mild mass effect on the thecal sac. No foraminal  lesions/foraminal stenosis. IMPRESSION: 1. No acute bony findings. 2. Small thoracic disc protrusions noted at T6-7, T9-10 and T10-11 as detailed above. 3. Normal appearance of the thoracic spinal cord. Electronically Signed   By: Marijo Sanes M.D.   On: 06/18/2018 11:19   US Soft Tissue Head & Neck (non-thyroid)  Result Date: 07/06/2018 CLINICAL DATA:  Swelling of left neck and history of prior thyroidectomy for thyroid carcinoma. EXAM: ULTRASOUND OF HEAD/NECK SOFT TISSUES TECHNIQUE: Ultrasound examination of the head and neck soft tissues was performed in the area of clinical concern. COMPARISON:  Neck ultrasound on 12/18/2016 FINDINGS: Ultrasound demonstrates no soft tissue mass, lymphadenopathy or abnormal soft tissue in the neck. No abnormal fluid collections identified. No findings suspicious for recurrent thyroid carcinoma. IMPRESSION: Unremarkable neck ultrasound demonstrating no evidence of lymphadenopathy or abnormal soft tissue mass. Electronically Signed   By: Aletta Edouard M.D.   On: 07/06/2018 11:00   Ct Shoulder Left Wo Contrast  Result Date: 06/17/2018 CLINICAL DATA:  58 year old female status post fall onto left shoulder last night. Pain. EXAM: CT OF THE UPPER LEFT EXTREMITY WITHOUT CONTRAST TECHNIQUE: Multidetector CT imaging of the upper left extremity was performed according to the standard protocol. COMPARISON:  Left shoulder radiographs today.  CTA chest 06/09/2018. FINDINGS: No glenohumeral dislocation. Intact proximal left humerus and visible humerus shaft. Intact left scapula. Intact left clavicle. Normal acromioclavicular joint alignment.  Degenerative changes at the left [facility] joint with small subchondral cysts and degenerative spurring. Soft tissue surrounding the left shoulder are within normal limits aside from a benign appearing intramuscular lipoma of the left pectoralis on series 3, image 27. No left axillary lymphadenopathy. Visible left ribs and spine appear stable. Sequelae  of thyroidectomy. Stable visible mediastinum. Stable visible left lung parenchyma. IMPRESSION: No acute traumatic injury identified about the left shoulder. Electronically Signed   By: Genevie Ann M.D.   On: 06/17/2018 22:41   Dg Chest Port 1 View  Result Date: 07/09/2018 CLINICAL DATA:  Shortness of breath EXAM: PORTABLE CHEST 1 VIEW COMPARISON:  07/08/2018 FINDINGS: Cardiac shadow is enlarged but stable. Slight increase in vascular congestion is noted when compare with the prior exam. Stable bibasilar atelectatic changes are noted. Degenerative change of the thoracic spine is seen. IMPRESSION: Slight increase in the degree of vascular congestion. Stable bibasilar atelectasis. Electronically Signed   By: Inez Catalina M.D.   On: 07/09/2018 08:20   Dg Chest Port 1 View  Result Date: 07/08/2018 CLINICAL DATA:  Shortness of breath, history chronic diastolic CHF, COPD, diabetes mellitus, hypertension, thyroid cancer EXAM: PORTABLE CHEST 1 VIEW COMPARISON:  Portable exam 0708 hours compared to 07/07/2018 FINDINGS: Enlargement of cardiac silhouette with pulmonary vascular congestion. Mediastinal contours normal with atherosclerotic calcification aorta noted. Bibasilar atelectasis decreased since previous exam. Upper lungs clear. No pleural effusion or pneumothorax. Surgical clips in the cervical region bilaterally likely reflect prior thyroidectomy. IMPRESSION: Enlargement of cardiac silhouette with pulmonary vascular congestion. Decreased bibasilar atelectasis. Electronically Signed   By: Lavonia Dana M.D.   On: 07/08/2018 08:14   Dg Chest Port 1 View  Result Date: 07/07/2018 CLINICAL DATA:  Shortness of breath. EXAM: PORTABLE CHEST 1 VIEW COMPARISON:  Radiograph of July 06, 2018. FINDINGS: Stable cardiomediastinal silhouette. No pneumothorax is noted. Stable bibasilar opacities are noted concerning for atelectasis or possibly infiltrates. No pleural effusion is noted. Bony thorax is unremarkable. IMPRESSION:  Stable bibasilar opacities concerning for atelectasis or infiltrates. Electronically Signed   By: Marijo Conception, M.D.   On: 07/07/2018 08:08   Dg Chest Port 1 View  Result Date: 07/06/2018 CLINICAL DATA:  Shortness of breath EXAM: PORTABLE CHEST 1 VIEW COMPARISON:  07/05/2018 FINDINGS: Lingular/left lower lobe scarring/atelectasis. Mild right basilar atelectasis. No focal consolidation. No frank interstitial edema. No pleural effusion or pneumothorax. The heart is normal in size. Degenerative changes of the visualized thoracolumbar spine. IMPRESSION: Lingular and bilateral lower lobe scarring/atelectasis. No evidence of acute cardiopulmonary disease. Electronically Signed   By: Julian Hy M.D.   On: 07/06/2018 07:38   Dg Chest Port 1 View  Result Date: 07/04/2018 CLINICAL DATA:  Shortness of breath and bilateral feet swelling EXAM: PORTABLE CHEST 1 VIEW COMPARISON:  06/17/2018 FINDINGS: Cardiomegaly and vascular pedicle widening. Diffuse interstitial opacity with Kerley lines and cephalized blood flow. No effusion or pneumothorax. Postoperative thoracic inlet IMPRESSION: CHF. Electronically Signed   By: Monte Fantasia M.D.   On: 07/04/2018 06:42   Dg Chest Portable 1 View  Result Date: 06/17/2018 CLINICAL DATA:  Dyspnea EXAM: PORTABLE CHEST 1 VIEW COMPARISON:  06/09/2018, 04/23/2018, CT 06/09/2018 FINDINGS: Cardiomegaly with enlarged pulmonary arteries and vascular congestion. Similar appearance of streaky bibasilar scarring. Slight increased hazy opacity at the bases. No pneumothorax. Postsurgical changes at the thoracic inlet. No pneumothorax. IMPRESSION: 1. Cardiomegaly with vascular congestion. Enlarged appearing central pulmonary vessels, possible hypertension 2. Chronic linear atelectasis or scarring at the bases. Slight increased hazy  opacity may reflect superimposed edema or mild infiltrates. Electronically Signed   By: Donavan Foil M.D.   On: 06/17/2018 23:52   Dg Shoulder  Left  Result Date: 06/17/2018 CLINICAL DATA:  Pain after fall last night EXAM: LEFT SHOULDER - 2+ VIEW COMPARISON:  None. FINDINGS: Mild AC joint degenerative changes.  No fracture or dislocation. IMPRESSION: Negative. Electronically Signed   By: Dorise Bullion III M.D   On: 06/17/2018 14:31   Dg Knee Complete 4 Views Right  Result Date: 06/27/2018 CLINICAL DATA:  58 year old female status post fall from car onto right lower extremity. Pain. EXAM: RIGHT KNEE - COMPLETE 4+ VIEW COMPARISON:  None. FINDINGS: No evidence of fracture, dislocation, or joint effusion. Bone mineralization is within normal limits for age. Mild tricompartmental degenerative spurring. Joint spaces are preserved. No discrete soft tissue injury. IMPRESSION: No acute fracture or dislocation identified about the right knee. Electronically Signed   By: Genevie Ann M.D.   On: 06/27/2018 16:50   Dg Hip Unilat With Pelvis 2-3 Views Right  Result Date: 07/06/2018 CLINICAL DATA:  Fall with right upper leg pain. EXAM: DG HIP (WITH OR WITHOUT PELVIS) 2-3V RIGHT COMPARISON:  06/27/2018 FINDINGS: There is no evidence of acute hip fracture or dislocation. The bony pelvis is intact without evidence of fracture or diastasis. 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 IMPRESSION: No acute fractures identified. Electronically Signed   By: Aletta Edouard M.D.   On: 07/06/2018 14:48   Dg Hip Unilat W Or Wo Pelvis 2-3 Views Right  Result Date: 06/27/2018 CLINICAL DATA:  Fall out of car with right hip pain. Initial encounter. EXAM: DG HIP (WITH OR WITHOUT PELVIS) 2-3V RIGHT COMPARISON:  None. FINDINGS: No acute fracture or dislocation identified. The bony pelvis appears intact. Mild osteoarthritis of both hip joints. IMPRESSION: No acute fracture identified. Electronically Signed   By: Aletta Edouard M.D.   On: 06/27/2018 16:49   Dg Femur, Min 2 Views Right  Result Date: 07/06/2018 CLINICAL DATA:  Pain after fall. EXAM: RIGHT FEMUR 2 VIEWS  COMPARISON:  None. FINDINGS: There is no evidence of fracture or other focal bone lesions. Soft tissues are unremarkable. IMPRESSION: Negative. Electronically Signed   By: Dorise Bullion III M.D   On: 07/06/2018 15:00   Vas [place] Lower Extremity Venous (dvt)  Result Date: 06/18/2018  Lower Venous Study Indications: Edema, and Pain.  Risk Factors: Cancer Thyroid cancer obesity. Limitations: Body habitus and patient is very sensitive for compression. Comparison Study: No prior study available Performing Technologist: Rudell Cobb  Examination Guidelines: A complete evaluation includes B-mode imaging, spectral Doppler, color Doppler, and power Doppler as needed of all accessible portions of each vessel. Bilateral testing is considered an integral part of a complete examination. Limited examinations for reoccurring indications may be performed as noted.  Right Venous Findings: +---------+---------------+---------+-----------+----------+-------------------+            Compressibility Phasicity Spontaneity Properties Summary              +---------+---------------+---------+-----------+----------+-------------------+  CFV                       Yes       Yes                    not well visualized  with compression     +---------+---------------+---------+-----------+----------+-------------------+  SFJ       Full                                                                  +---------+---------------+---------+-----------+----------+-------------------+  FV Prox   Full                                                                  +---------+---------------+---------+-----------+----------+-------------------+  FV Mid    Full                                                                  +---------+---------------+---------+-----------+----------+-------------------+  FV Distal                 Yes       Yes                    not well visualized                                                               with compression     +---------+---------------+---------+-----------+----------+-------------------+  PFV                                                        Not visualized       +---------+---------------+---------+-----------+----------+-------------------+  POP       Full            Yes       Yes                                         +---------+---------------+---------+-----------+----------+-------------------+  PTV       Full                                             not all segment  visualized with                                                                  compression          +---------+---------------+---------+-----------+----------+-------------------+  PERO      Full                                             not all segment                                                                  visualized with                                                                  compression          +---------+---------------+---------+-----------+----------+-------------------+  Left Venous Findings: +---------+---------------+---------+-----------+----------+-------------------+            Compressibility Phasicity Spontaneity Properties Summary              +---------+---------------+---------+-----------+----------+-------------------+  CFV       Full            Yes       Yes                                         +---------+---------------+---------+-----------+----------+-------------------+  SFJ       Full                                                                  +---------+---------------+---------+-----------+----------+-------------------+  FV Prox   Full                                                                  +---------+---------------+---------+-----------+----------+-------------------+  FV Mid    Full                                                                   +---------+---------------+---------+-----------+----------+-------------------+  FV Distal                 Yes       Yes                    not well visualized                                                              with compression     +---------+---------------+---------+-----------+----------+-------------------+  PFV                                                        Not visualized       +---------+---------------+---------+-----------+----------+-------------------+  POP       Full            Yes       Yes                                         +---------+---------------+---------+-----------+----------+-------------------+  PTV                                                        not well visualized                                                              with compression     +---------+---------------+---------+-----------+----------+-------------------+  PERO                                                       not well visualized                                                              with compression     +---------+---------------+---------+-----------+----------+-------------------+    Summary: Right: There is no evidence of deep vein thrombosis in the lower extremity. However, portions of this examination were limited- see technologist comments above. No cystic structure found in the popliteal fossa. Left: There is no evidence of deep vein thrombosis in the lower extremity. However, portions of this examination were limited- see technologist comments above. No cystic structure found in the popliteal fossa.  *See table(s) above for measurements and observations. Electronically signed by Deitra Mayo MD on 06/18/2018 at 4:28:35 PM.    Final  Microbiology: Recent Results (from the past 240 hour(s))  Expectorated sputum assessment w rflx to resp cult     Status: None   Collection Time: 07/07/18 10:03 AM  Result Value Ref Range Status   Specimen Description  EXPECTORATED SPUTUM  Final   Special Requests NONE  Final   Sputum evaluation   Final    Sputum specimen not acceptable for testing.  Please recollect.   CRITICAL RESULT CALLED TO, READ BACK BY AND VERIFIED WITH: RN Dorrene German [id number] AT 35 BY CM Performed at [facility], [place] [address], [place], [place] [address]    Report Status 07/07/2018 FINAL  Final     Labs: Basic Metabolic Panel: Recent Labs  Lab 07/06/18 0409 07/07/18 0324 07/08/18 0546 07/09/18 0632 07/10/18 0523 07/11/18 0803  NA 139 138 137 138 136 135  K 3.8 3.7 3.8 3.5 4.4 3.9  CL 89* 91* 90* 93* 93* 92*  CO2 37* 33* 31 33* 33* 33*  GLUCOSE 105* 104* 117* 118* 114* 111*  BUN '9 11 14 13 12 15  ' CREATININE 0.57 0.59 0.66 0.61 0.70 0.64  CALCIUM 8.4* 8.3* 8.3* 8.2* 8.3* 8.4*  MG 2.0 1.9 1.9 1.8 2.0  --   PHOS 5.0* 5.5* 4.3 3.8 5.1*  --    Liver Function Tests: Recent Labs  Lab 07/06/18 0409 07/07/18 0324 07/08/18 0546 07/09/18 0632 07/10/18 0523  AST '18 17 23 25 21  ' ALT '17 16 20 19 19  ' ALKPHOS 86 85 82 77 76  BILITOT 0.5 0.5 0.8 0.7 0.6  PROT 7.1 7.0 7.4 7.4 7.7  ALBUMIN 3.5 3.7 3.9 3.8 3.8   No results for input(s): LIPASE, AMYLASE in the last 168 hours. No results for input(s): AMMONIA in the last 168 hours. CBC: Recent Labs  Lab 07/06/18 0409 07/07/18 0324 07/08/18 0546 07/09/18 0632 07/10/18 0523  WBC 12.5* 11.4* 11.9* 12.6* 11.8*  NEUTROABS 9.6* 8.4* 8.7* 9.4* 9.0*  HGB 11.9* 11.8* 12.5 11.7* 12.1  HCT 39.1 38.8 40.4 39.3 39.4  MCV 95.4 95.3 93.7 94.0 94.7  PLT 255 260 280 270 265   Cardiac Enzymes: No results for input(s): CKTOTAL, CKMB, CKMBINDEX, TROPONINI in the last 168 hours. BNP: BNP (last 3 results) Recent Labs    06/09/18 2054 06/17/18 2326 07/04/18 0531  BNP 14.6 15.6 67.5    ProBNP (last 3 results) No results for input(s): PROBNP in the last 8760 hours.  CBG: Recent Labs  Lab 07/10/18 0656 07/10/18 1700 07/10/18 2131 07/11/18 0613 07/11/18 1103  GLUCAP  114* 120* 174* 125* 105*       Signed:  Florencia Reasons MD, PhD  [facility] 07/11/2018, 11:30 AM

## 2018-07-11 NOTE — Progress Notes (Signed)
PT Cancellation/Discharge Note  Patient Details Name: Michele Owens MRN: [id number] DOB: [birth year]   Cancelled Treatment:    Reason Eval/Treat Not Completed: Other (comment)PT signed off on pt on 07/07/28 (see notes from that day).  She is refusing recommended home therapy follow up.  She is mod I to supervision level with her mobility.  PT will not continue to follow.  Please page with questions.  Thanks,  Barbarann Ehlers. Daysen Gundrum, PT, DPT  Acute Rehabilitation [phone] pager [phone]) [phone] office   [name] 07/11/2018, 11:22 AM

## 2018-07-11 NOTE — Progress Notes (Signed)
Discharge information given with teach back. Patient's belongings with patient:  Purse, clothes,shoes,cellphone.  Patients questions answered. Peripheral iv removed, clean dry and intact, pressure and dressing applied. CCMD called.

## 2018-07-11 NOTE — Progress Notes (Signed)
Patient ID: Michele Owens, female   DOB: [birth year], 59 y.o.   MRN: [id number]     Advanced Heart Failure Rounding Note  PCP-Cardiologist: No primary care provider on file.   Subjective:    I/Os not particularly negative but weight down 1 lb.  She says that her breathing is better.   Echo: EF 60-65%, normal RV function with mild enlargement, respiratory septal variation (?advanced pulmonary disease versus constriction).    Objective:   Weight Range: (!) 137.7 kg Body mass index is 50.52 kg/m.   Vital Signs:   Temp:  [98 F (36.7 C)-98.7 F (37.1 C)] 98.1 F (36.7 C) (04/02 0828) Pulse Rate:  [80-88] 80 (04/02 0854) Resp:  [17-18] 18 (04/02 0854) BP: (103-109)/(54-70) 107/60 (04/02 0828) SpO2:  [91 %-94 %] 91 % (04/02 0856) FiO2 (%):  [28 %] 28 % (04/02 0856) Weight:  [137.7 kg] 137.7 kg (04/02 0508) Last BM Date: 07/09/18  Weight change: Filed Weights   07/09/18 0323 07/10/18 0643 07/11/18 0508  Weight: (!) 139 kg (!) 138.3 kg (!) 137.7 kg    Intake/Output:   Intake/Output Summary (Last 24 hours) at 07/11/2018 0951 Last data filed at 07/11/2018 0836 Gross per 24 hour  Intake 603 ml  Output 900 ml  Net -297 ml      Physical Exam    General: NAD, obese Neck: JVP difficult, no thyromegaly or thyroid nodule.  Lungs: Clear to auscultation bilaterally with normal respiratory effort. CV: Nonpalpable PMI.  Heart regular S1/S2, no S3/S4, no murmur.  No peripheral edema.   Abdomen: Soft, nontender, no hepatosplenomegaly, no distention.  Skin: Intact without lesions or rashes.  Neurologic: Alert and oriented x 3.  Psych: Normal affect. Extremities: No clubbing or cyanosis.  HEENT: Normal.    Telemetry   NSR, personally reviewed.   Labs    CBC Recent Labs    07/09/18 0632 07/10/18 0523  WBC 12.6* 11.8*  NEUTROABS 9.4* 9.0*  HGB 11.7* 12.1  HCT 39.3 39.4  MCV 94.0 94.7  PLT 270 409   Basic Metabolic Panel Recent Labs    07/09/18 0632 07/10/18 0523  07/11/18 0803  NA 138 136 135  K 3.5 4.4 3.9  CL 93* 93* 92*  CO2 33* 33* 33*  GLUCOSE 118* 114* 111*  BUN 13 12 15   CREATININE 0.61 0.70 0.64  CALCIUM 8.2* 8.3* 8.4*  MG 1.8 2.0  --   PHOS 3.8 5.1*  --    Liver Function Tests Recent Labs    07/09/18 0632 07/10/18 0523  AST 25 21  ALT 19 19  ALKPHOS 77 76  BILITOT 0.7 0.6  PROT 7.4 7.7  ALBUMIN 3.8 3.8   No results for input(s): LIPASE, AMYLASE in the last 72 hours. Cardiac Enzymes No results for input(s): CKTOTAL, CKMB, CKMBINDEX, TROPONINI in the last 72 hours.  BNP: BNP (last 3 results) Recent Labs    06/09/18 2054 06/17/18 2326 07/04/18 0531  BNP 14.6 15.6 67.5    ProBNP (last 3 results) No results for input(s): PROBNP in the last 8760 hours.   D-Dimer No results for input(s): DDIMER in the last 72 hours. Hemoglobin A1C Recent Labs    07/09/18 0632  HGBA1C 6.6*   Fasting Lipid Panel No results for input(s): CHOL, HDL, LDLCALC, TRIG, CHOLHDL, LDLDIRECT in the last 72 hours. Thyroid Function Tests No results for input(s): TSH, T4TOTAL, T3FREE, THYROIDAB in the last 72 hours.  Invalid input(s): FREET3  Other results:   Imaging  No results found.   Medications:     Scheduled Medications: . allopurinol  100 mg Oral Daily  . carvedilol  6.25 mg Oral BID WC  . clonazePAM  2 mg Oral TID  . diclofenac sodium  2 g Topical QID  . enoxaparin (LOVENOX) injection  70 mg Subcutaneous Daily  . furosemide  80 mg Intravenous BID  . guaiFENesin  1,200 mg Oral BID  . ipratropium-albuterol  3 mL Nebulization BID  . levothyroxine  224 mcg Oral QAC breakfast  . lidocaine  1 patch Transdermal Q24H  . lisinopril  5 mg Oral Daily  . loratadine  10 mg Oral Daily  . mouth rinse  15 mL Mouth Rinse BID  . montelukast  10 mg Oral QHS  . pantoprazole  40 mg Oral Daily  . polyethylene glycol  17 g Oral BID  . pravastatin  40 mg Oral QPM  . senna-docusate  1 tablet Oral BID  . sodium chloride flush  3 mL  Intravenous Q12H  . sodium chloride flush  3 mL Intravenous Q12H  . spironolactone  100 mg Oral Daily  . umeclidinium-vilanterol  1 puff Inhalation Daily    Infusions: . sodium chloride 250 mL (07/05/18 1057)  . sodium chloride    . sodium chloride 10 mL/hr at 07/11/18 0608    PRN Medications: sodium chloride, sodium chloride, acetaminophen, bisacodyl, HYDROcodone-acetaminophen, methocarbamol, nitroGLYCERIN, ondansetron (ZOFRAN) IV, sodium chloride flush, sodium chloride flush   Assessment/Plan   1. Acute on chronic diastolic CHF: Echo this admission with EF 60-65%, mildly dilated RV with normal systolic function, respiratory variation of the IV septum => most likely due to severe pulmonary disease, less likely constrictive pericarditis (normal-appearing pericardium on CT chest in 3/20).  History of diastolic CHF, not taking much Lasix at home (only 20 mg daily).  Creatinine stable so far with diuresis.  Exam is very difficult for volume but she says she feels better.  - We have made the decision based on limited PPE resources not to do RHC.  As above, I am not certain of her volume and would like to assess her PA pressure, but as she feels better. I am going to transition her to a higher dose of po Lasix and hold off on cath.  Will start Lasix 40 qam/20 qpm (was only taking 20 mg daily at home).  - I think that she would be a good Cardiomems candidate, will need to work on this as outpatient.  2. COPD: Patient has history of severe COPD, wearing home oxygen.   No wheezing on exam today.  - Per primary service.  3. Diabetes: Continue current regimen.   Length of Stay: 7  Loralie Champagne, MD  07/11/2018, 9:51 AM  Advanced Heart Failure Team Pager [phone] (M-F; 7a - 4p)  Please contact [facility] for night-coverage after hours (4p -7a ) and weekends on [email or web address]

## 2018-07-23 ENCOUNTER — Telehealth (HOSPITAL_COMMUNITY): Payer: Self-pay | Admitting: Cardiology

## 2018-07-23 NOTE — Telephone Encounter (Signed)
Please call.   Instruct to take lasix 80 mg twice a day for 2 days. Take extra 20 meq potassium for the next 2 days.   Do the following things EVERYDAY: 1) Weigh yourself in the morning before breakfast. Write it down and keep it in a log. 2) Take your medicines as prescribed 3) Eat low salt foods-Limit salt (sodium) to 2000 mg per day.  4) Stay as active as you can everyday 5) Limit all fluids for the day to less than 2 liters   Please call.  Thanks Darrick Grinder NP  2:16 PM

## 2018-07-23 NOTE — Telephone Encounter (Signed)
HH called to report 7lb weight gain since 4/2 Increased abd swelling and patient unable to lay flat at night Denies LE edema or increase SOB,vitals stable  Patient scheduled for follow up on 4/15    Please advise further if you would like to address anything prior to appt

## 2018-07-24 ENCOUNTER — Telehealth (HOSPITAL_COMMUNITY): Payer: Self-pay

## 2018-07-24 ENCOUNTER — Ambulatory Visit (HOSPITAL_COMMUNITY)
Admit: 2018-07-24 | Discharge: 2018-07-24 | Disposition: A | Discharge status: Home / Self Care | Payer: Medicaid Other | Attending: Cardiology | Admitting: Cardiology

## 2018-07-24 ENCOUNTER — Other Ambulatory Visit: Payer: Self-pay

## 2018-07-24 DIAGNOSIS — I5042 Chronic combined systolic (congestive) and diastolic (congestive) heart failure: Secondary | ICD-10-CM | POA: Diagnosis not present

## 2018-07-24 DIAGNOSIS — I5032 Chronic diastolic (congestive) heart failure: Secondary | ICD-10-CM

## 2018-07-24 DIAGNOSIS — J449 Chronic obstructive pulmonary disease, unspecified: Secondary | ICD-10-CM

## 2018-07-24 MED ORDER — POTASSIUM CHLORIDE CRYS ER 20 MEQ PO TBCR: 20.0000 meq | EXTENDED_RELEASE_TABLET | Freq: Two times a day (BID) | ORAL | 0 refills | Status: DC

## 2018-07-24 MED ORDER — FUROSEMIDE 20 MG PO TABS: 40.0000 mg | ORAL_TABLET | Freq: Two times a day (BID) | ORAL | 0 refills | Status: DC

## 2018-07-24 NOTE — Telephone Encounter (Signed)
Reviewed AVS multiple times, will also mail hard copy  Please increase Lasix to 80 mg every morning and 40 mg in the evening for x 3 days,  then 40 mg TWICE A DAY after that.   Take Potassium 20 mEq TWICE A DAY in addition to your daily spironolactone.   Please call your therapist Starla Link [phone]) as it sounds like she can helps with meds.   Lab work in 1 week, scheduled for 22 April @11 :00  We put you on the list to get you started on the Cardiomems process, we will update you as this proceeds.  Followup 1 week with NP via telehealth, scheduled for 27 April @ 11:30  Staff messaged Daphne to set up with paramed

## 2018-07-24 NOTE — Telephone Encounter (Signed)
Patient called back she is aware and agreeable with plan.  

## 2018-07-24 NOTE — Patient Instructions (Addendum)
  Please increase Lasix to 80 mg every morning and 40 mg in the evening for x 3 days,  then 40 mg TWICE A DAY after that.   Take Potassium 20 mEq TWICE A DAY in addition to your daily spironolactone.   Please call your therapist Starla Link [phone]) as it sounds like she can helps with meds.   Lab work in 1 week, scheduled for 22 April @11 :00  We put you on the list to get you started on the Cardiomems process, we will update you as this proceeds.  Followup 1 week with NP via telehealth, scheduled for 27 April @ 11:30

## 2018-07-24 NOTE — Telephone Encounter (Signed)
Attempt to contact patient with instructions [facility]

## 2018-07-25 NOTE — Progress Notes (Signed)
Heart Failure TeleHealth Note  Due to national recommendations of social distancing due to [place] 19, Audio/video telehealth visit is felt to be most appropriate for this patient at this time.  See MyChart message from today for patient consent regarding telehealth for [facility].  Date:  07/25/2018   ID:  Michele Owens, DOB [birth year], MRN [id number]  Location: Home  Provider location: [facility] Type of Visit: Established patient   PCP:  [name], MD  Cardiologist: Dr. Aundra Dubin  Chief Complaint: Shortness of breath.   History of Present Illness: Michele Owens is a 58 y.o. female who presents via audio/video conferencing for a telehealth visit today.   We attempted a video conference but video froze repeatedly so we used audio only.   she denies symptoms worrisome for COVID 19.   Patient has a history of HTN, obesity, OSA, COPD, hyperlipidemia, diastolic CHF, and SVT.  She no longer smokes.  She was admitted in 0/86 with diastolic CHF and COPD exacerbation.  She was admitted in 1/17 with COPD exacerbation.  She was set up for a Cardiolite in 2/17.  This showed a small, moderate intensity partially reversible inferolateral defect.  LHC/RHC in 3/17 showed no significant CAD, mildly elevated PCWP.  PFTs in 3/17 showed severe COPD.    Admitted 10/15-10/19/17 with AECOPD and A/C diastolic CHF.  Diuresed with IV lasix. Completed course for CAP. Pt also noted have multinodular goiter on [place].  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   Echo in 4/18 showed EF 45-50%, moderate dilated RV with moderately decreased systolic function.   She was admitted again in 4/18 with COPD exacerbation, acute on chronic diastolic CHF, and CAP.    She was admitted in 3/20 with acute on chronic diastolic CHF and treated with IV Lasix.  Echo showed EF 60-65%, mild RV enlargement with normal function, respirophasic septal variation.   She is  short of breath if she walks outside her house.  She is confused about her medications. Her feet are swelling.  Weight is up about 10 lbs since getting home she says.  +Orthopnea.  No chest pain.  No lightheadedness.  No palpitations.   ECG (personally reviewed): NSR, normal  Labs (11/12): K 3.6, creatinine 0.73 Labs (12/13): K 3.9, creatinine 0.9, BNP 22 Labs (11/14): K 3.9, creatinine 0.5 Labs (2/17): K 4.2, creatinine 0.62, HCT 37.4 Labs (5/17): K 3.7, creatinine 0.7, BNP 6.6 Labs (4/18): K 3.8, creatinine 0.6, hgb 11.2 Labs (4/20): K 3.9, creatinine 0.64  PMH: 1. Back and hip pain 2. HTN 3. Obesity 4. GERD 5. Hyperlipidemia 6. Depression 7. COPD: Has quit smoking. Now on home oxygen but does not use all the time.  PFTs (3/17) with severe COPD.  8. 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 Event monitor (12/14) with only NSR noted.  10. Chest pain: Atypical.  ETT-Cardiolite (12/13) with EF 60%, no ischemia/infarction. Lexiscan Cardiolite (2/17) with small, moderate-intensity partially reversible inferolateral defect. LHC (3/17): No significant CAD. 11. Syncope: Uncertain etiology.   12. OSA: Cannot tolerate CPAP. Sleep study negative for OSA in 5/17.  13. Gout 14. Migraines 15. Diabetes 16. Diastolic CHF: Echo (5/78) with EF 55-60%, grade II diastolic dysfunction.  - RHC (3/17) with mean RA 8, PA 45/20 mean 30, mean PCWP 20; PVR 1.5 WU.  - Echo (4/18): EF 45-50%, diffuse hypokinesis, grade II diastolic dysfunction, moderately dilated RV with moderately decreased  systolic function. - Echo (4/20): EF 60-65%, mild RV enlargement with normal systolic function. Respirophasic septal variation noted.   17. Papillary thyroid carcinoma: Noted on biopsy 3/18.   Current Outpatient Medications  Medication Sig Dispense Refill  . albuterol (PROVENTIL HFA;VENTOLIN HFA) 108 (90 Base) MCG/ACT inhaler Inhale 1-2 puffs into the lungs every 6 (six) hours  as needed for wheezing or shortness of breath. 1 Inhaler 0  . allopurinol (ZYLOPRIM) 100 MG tablet Take 100 mg by mouth daily.    [name] ELLIPTA 62.5-25 MCG/INH AEPB Inhale 1 puff into the lungs daily.     . Blood Glucose Monitoring Suppl (ACCU-CHEK AVIVA PLUS) w/Device KIT USE AS DIRECTED TO CHECK BLOOD SUGAR  0  . butalbital-acetaminophen-caffeine (FIORICET, ESGIC) 50-325-40 MG tablet Take 1-2 tablets by mouth every 4 (four) hours as needed for migraine.    . calcitRIOL (ROCALTROL) 0.5 MCG capsule Take 1 capsule (0.5 mcg total) by mouth daily. 30 capsule 0  . carvedilol (COREG) 6.25 MG tablet Take 1 tablet (6.25 mg total) by mouth 2 (two) times daily with a meal. 60 tablet 0  . cetirizine (ZYRTEC) 10 MG tablet Take 10 mg by mouth daily.  2  . clonazePAM (KLONOPIN) 2 MG tablet Take 2 mg by mouth 3 (three) times daily. for anxiety  1  . diclofenac sodium (VOLTAREN) 1 % GEL Apply 2 g topically 4 (four) times daily as needed (for pain). 1 Tube 0  . EPINEPHrine 0.3 mg/0.3 mL IJ SOAJ injection Inject 0.3 mg into the muscle daily as needed (allergic reaction).    . FEROSUL 325 (65 Fe) MG tablet Take 325 mg by mouth daily.  3  . furosemide (LASIX) 20 MG tablet Take 2 tablets (40 mg total) by mouth 2 (two) times daily. Take 5m every morning and 448mevery evening 360 tablet 0  . glucose blood test strip Use as instructed to check blood sugar up to 4 times daily 100 each 0  . guaiFENesin (MUCINEX) 600 MG 12 hr tablet Take 2 tablets (1,200 mg total) by mouth 2 (two) times daily. 60 tablet 0  . HYDROcodone-acetaminophen (NORCO/VICODIN) 5-325 MG tablet Take 1 tablet by mouth every 6 (six) hours as needed for moderate pain.   0  . ipratropium-albuterol (DUONEB) 0.5-2.5 (3) MG/3ML SOLN Take 3 mLs by nebulization 4 (four) times daily.   3  . levothyroxine (SYNTHROID, LEVOTHROID) 112 MCG tablet Take 224 mcg by mouth daily.  5  . lidocaine (LIDODERM) 5 % Place 1 patch onto the skin daily. Remove & Discard patch  within 12 hours or as directed by MD 5 patch 0  . lisinopril (PRINIVIL,ZESTRIL) 5 MG tablet Take 1 tablet (5 mg total) by mouth daily. Please call for office visit [phone] tablet 0  . loperamide (IMODIUM) 2 MG capsule Take 1 capsule (2 mg total) by mouth 4 (four) times daily as needed for diarrhea or loose stools. 12 capsule 0  . metFORMIN (GLUCOPHAGE) 500 MG tablet Take 1 tablet (500 mg total) by mouth 2 (two) times daily with a meal. 30 tablet 1  . methocarbamol (ROBAXIN) 500 MG tablet Take 1 tablet (500 mg total) by mouth every 8 (eight) hours as needed for muscle spasms. 20 tablet 0  . metoCLOPramide (REGLAN) 10 MG tablet Take 1 tablet (10 mg total) by mouth every 6 (six) hours as needed for nausea (or headache). 30 tablet 0  . montelukast (SINGULAIR) 10 MG tablet Take 1 tablet (10 mg total) by mouth at  bedtime. 30 tablet 0  . nitroGLYCERIN (NITROSTAT) 0.4 MG SL tablet Place 1 tablet (0.4 mg total) under the tongue every 5 (five) minutes as needed for chest pain. 12 tablet 0  . nystatin cream (MYCOSTATIN) Apply 1 application topically 2 (two) times daily as needed for dry skin.   1  . omeprazole (PRILOSEC) 40 MG capsule Take 40 mg by mouth daily.  2  . OXYGEN Inhale 2 L into the lungs at bedtime.    . potassium chloride SA (K-DUR,KLOR-CON) 20 MEQ tablet Take 1 tablet (20 mEq total) by mouth 2 (two) times daily. 180 tablet 0  . pravastatin (PRAVACHOL) 40 MG tablet Take 40 mg by mouth every evening.   2  . QC STOOL SOFTENER PLS LAXATIVE 8.6-50 MG tablet Take 1 tablet by mouth at bedtime.  2  . spironolactone (ALDACTONE) 100 MG tablet Take 100 mg by mouth daily.  3  . tiotropium (SPIRIVA) 18 MCG inhalation capsule Place 18 mcg into inhaler and inhale daily.     No current facility-administered medications for this encounter.     Allergies:   Bee venom; Fioricet [butalbital-apap-caffeine]; Ibuprofen; Lamisil [terbinafine]; and Nsaids   Social History:  The patient  reports that she quit  smoking about 2 years ago. Her smoking use included cigarettes. She has a 20.50 pack-year smoking history. She has never used smokeless tobacco. She reports previous alcohol use. She reports that she does not use drugs.   Family History:  The patient's family history includes Brain cancer in her paternal aunt; Cancer in her cousin, cousin, father, and maternal uncle; Diabetes in her mother; Thyroid cancer in her mother.   ROS:  Please see the history of present illness.   All other systems are personally reviewed and negative.   Exam:  (Video/Tele Health Call; Exam is subjective and or/visual.) General:  Speaks in full sentences. No resp difficulty. Lungs: Normal respiratory effort with conversation.  Abdomen: Non-distended per patient report Extremities: Patient states that feet are "swelling" Neuro: Alert & oriented x 3.   Recent Labs: 06/18/2018: TSH 2.149 07/04/2018: B Natriuretic Peptide 67.5 07/10/2018: ALT 19; Hemoglobin 12.1; Magnesium 2.0; Platelets 265 07/11/2018: BUN 15; Creatinine, Ser 0.64; Potassium 3.9; Sodium 135  Personally reviewed   Wt Readings from Last 3 Encounters:  07/11/18 (!) 137.7 kg (303 lb 9.6 oz)  06/27/18 (!) 141.5 kg (312 lb)  06/09/18 (!) 141.5 kg (312 lb)      ASSESSMENT AND PLAN:  1. Chronic diastolic CHF: Echo in 4/62 with EF 60-65%, mildly dilated RV with normal systolic function, respiratory variation of the IV septum => most likely due to severe pulmonary disease, less likely constrictive pericarditis (normal-appearing pericardium on CT chest in 3/20). Since getting home from last hospitalization, she reports weight gain and "swelling."  NYHA class III dyspnea, likely combination of CHF and COPD.  - I think that she has developed volume overload since getting home, not sure how well she is taking her medications. I will have her increase Lasix to 80 qam/40 qpm x 3 days then continue Lasix at 40 mg bid.  She will need BMET in 1 week.   - I think that she  would be a good Cardiomems candidate, will start working on this.  - I am going to try to get her enrolled in the paramedicine program to help with her medications.   2. COPD: Patient has history of severe COPD, not regularly wearing home oxygen. Not wheezing today.   COVID screen  The patient does not have any symptoms that suggest any further testing/ screening at this time.  Social distancing reinforced today.  Relevant cardiac medications were reviewed at length with the patient today.   The patient does not have concerns regarding their medications at this time.   Recommended follow-up:  Telehealth next week with APP.   Today, I have spent 21 minutes with the patient with telehealth technology discussing the above issues .    Signed, Loralie Champagne, MD  07/25/2018 11:51 AM  [place] [address] [facility] [place] [address] [phone] (office) [phone] (fax)

## 2018-07-30 ENCOUNTER — Telehealth (HOSPITAL_COMMUNITY): Payer: Self-pay | Admitting: *Deleted

## 2018-07-30 NOTE — Telephone Encounter (Signed)
HHRN called to report pt is feeling much better, her wt is at 301 lbs today, down 9 lbs since last week.  She confirmed pt is sch for lab appt tomorrow and pt will be here for that.

## 2018-07-31 ENCOUNTER — Other Ambulatory Visit: Payer: Self-pay

## 2018-07-31 ENCOUNTER — Ambulatory Visit (HOSPITAL_COMMUNITY)
Admission: RE | Admit: 2018-07-31 | Discharge: 2018-07-31 | Disposition: A | Discharge status: Home / Self Care | Payer: Medicaid Other | Source: Ambulatory Visit | Attending: Cardiology | Admitting: Cardiology

## 2018-07-31 ENCOUNTER — Telehealth (HOSPITAL_COMMUNITY): Payer: Self-pay

## 2018-07-31 DIAGNOSIS — I5042 Chronic combined systolic (congestive) and diastolic (congestive) heart failure: Secondary | ICD-10-CM | POA: Diagnosis present

## 2018-07-31 LAB — BASIC METABOLIC PANEL
Anion gap: 13 (ref 5–15)
BUN: 13 mg/dL (ref 6–20)
CO2: 28 mmol/L (ref 22–32)
Calcium: 9.7 mg/dL (ref 8.9–10.3)
Chloride: 98 mmol/L (ref 98–111)
Creatinine, Ser: 0.6 mg/dL (ref 0.44–1.00)
GFR calc Af Amer: >60 mL/min (ref >60)
GFR calc non Af Amer: >60 mL/min (ref >60)
Glucose, Bld: 113 mg/dL — ABNORMAL HIGH (ref 70–99)
Potassium: 4 mmol/L (ref 3.5–5.1)
Sodium: 139 mmol/L (ref 135–145)

## 2018-07-31 NOTE — Telephone Encounter (Signed)
Left voicemail for Michele Owens to return call to set up initial paramedicine encounter.

## 2018-08-03 IMAGING — CT CT ABD-PELV W/ CM
2 of 5 series · 17 of 46 positions shown, 19 images · IV contrast (APPLIED)
Comparison: 12/27/2005

CLINICAL DATA: Abdominal pain and back pain beginning 11/24/2015.
Diarrhea.

EXAM:
CT ABDOMEN AND PELVIS WITH CONTRAST
TECHNIQUE: Multidetector CT imaging of the abdomen and pelvis was performed
using the standard protocol following bolus administration of
intravenous contrast.
CONTRAST:  100mL [id number] IOPAMIDOL (J1TLWI-CFF) INJECTION 61%

[Series 2: abd/ pelvis 5.0 i30f 1 · axial · 0.94mm/px · z∈[-494,-89]mm · 14 of 91 slices shown, 16 images]
[im 5/91  soft-tissue]
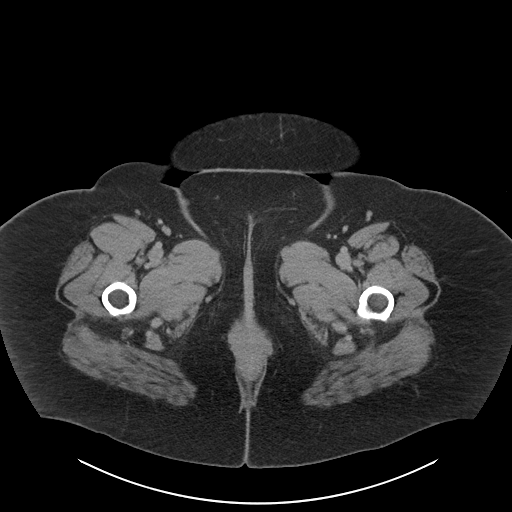
[im 5/91  bone]
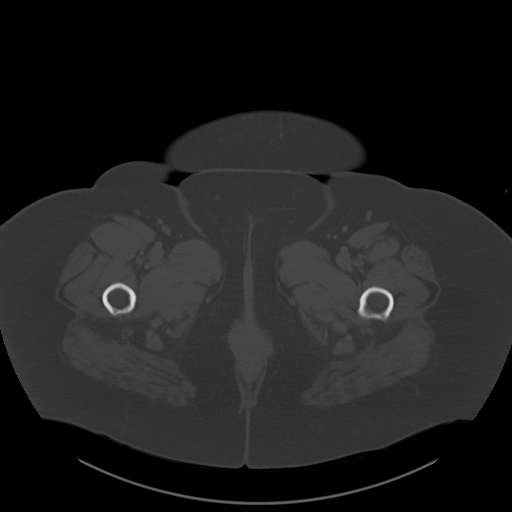
[im 14/91  soft-tissue]
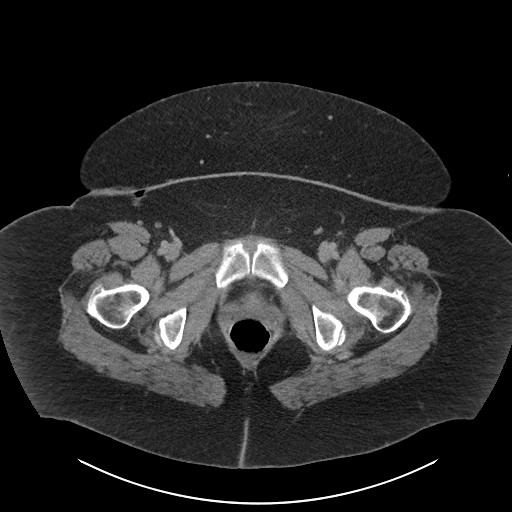
[im 19/91  soft-tissue]
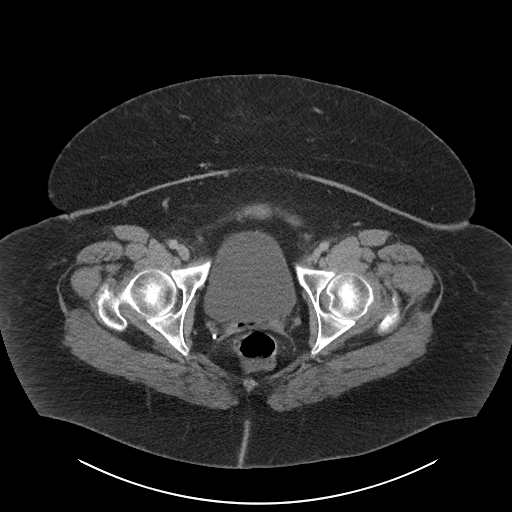
[im 23/91  soft-tissue]
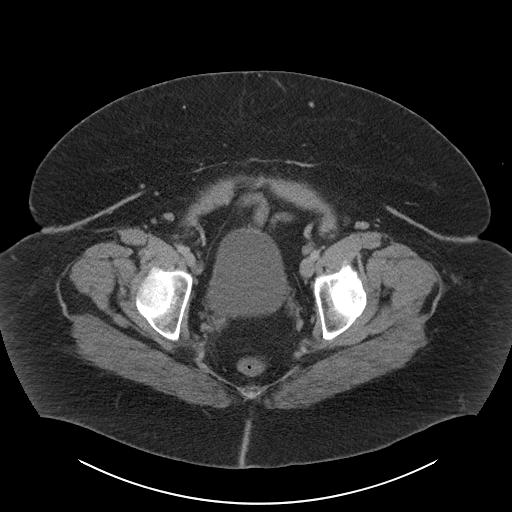
[im 32/91  soft-tissue]
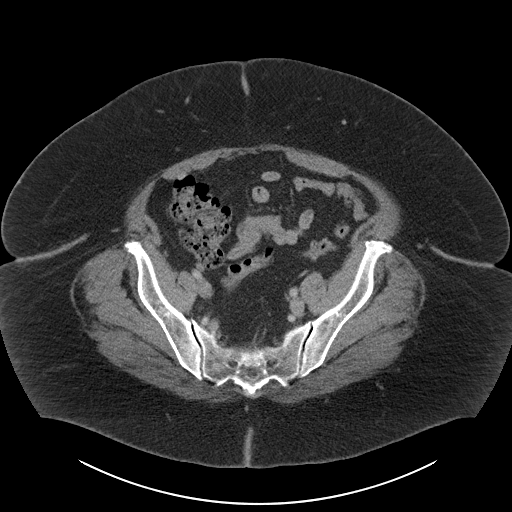
[im 37/91  soft-tissue]
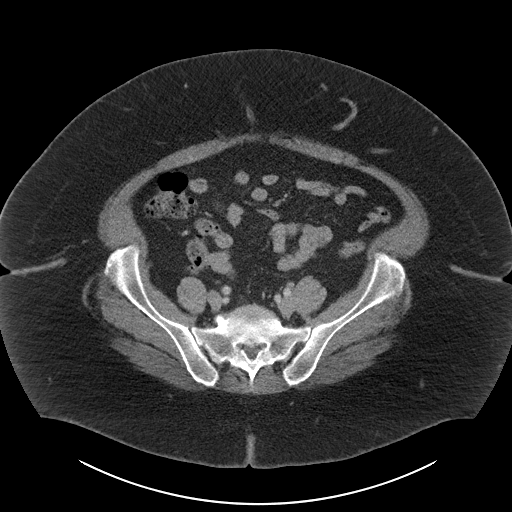
[im 41/91  soft-tissue]
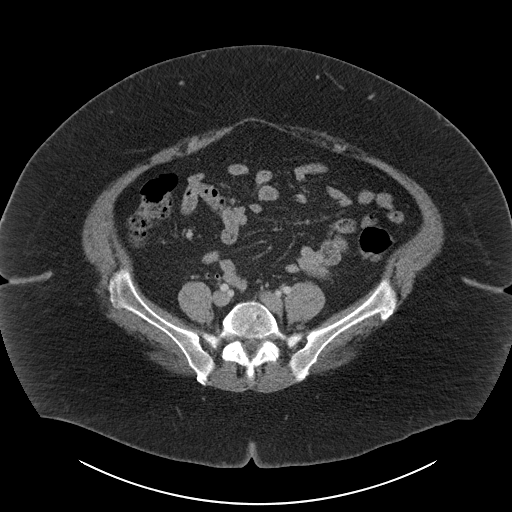
[im 50/91  soft-tissue]
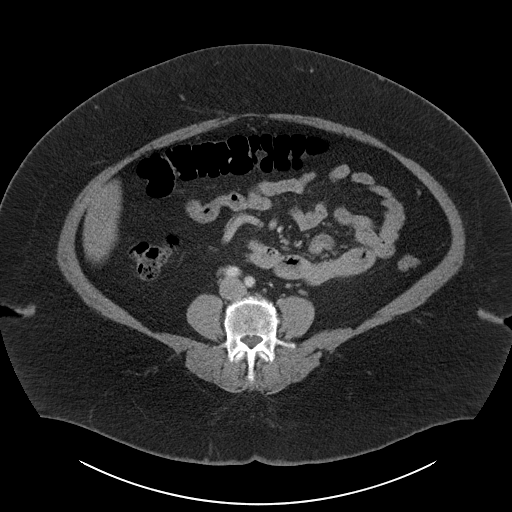
[im 55/91  soft-tissue]
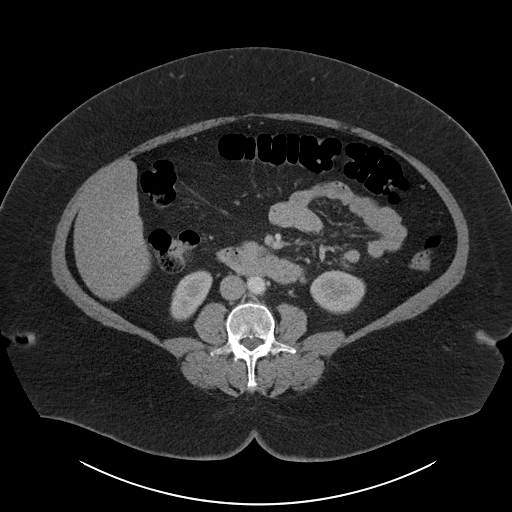
[im 55/91  bone]
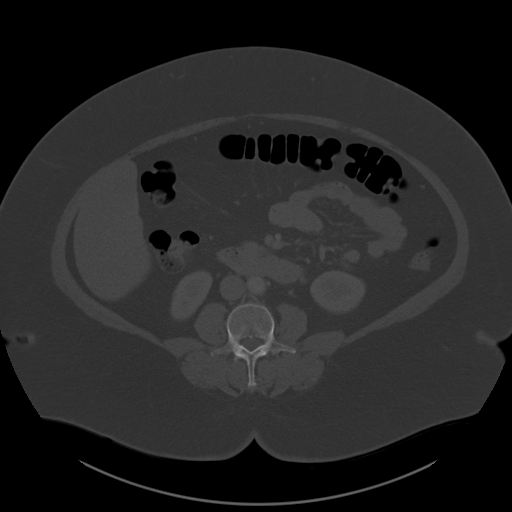
[im 59/91  soft-tissue]
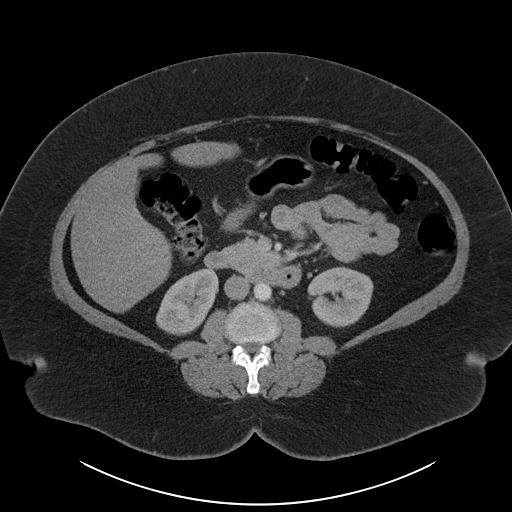
[im 68/91  soft-tissue]
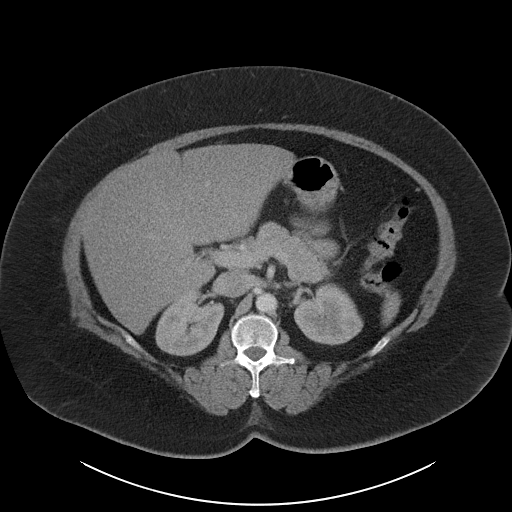
[im 73/91  soft-tissue]
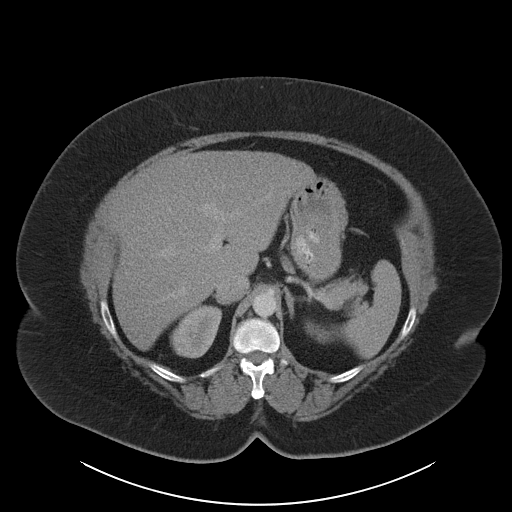
[im 77/91  soft-tissue]
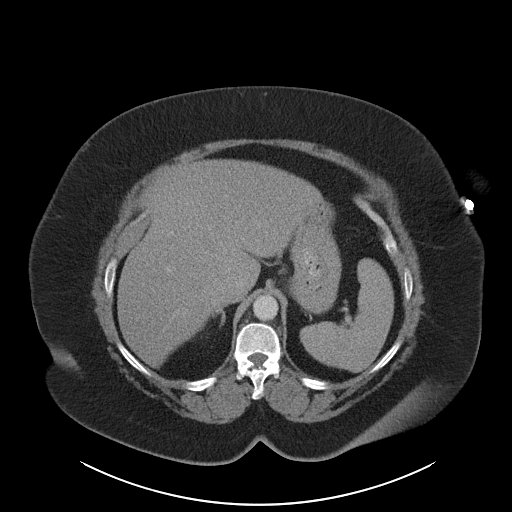
[im 86/91  soft-tissue]
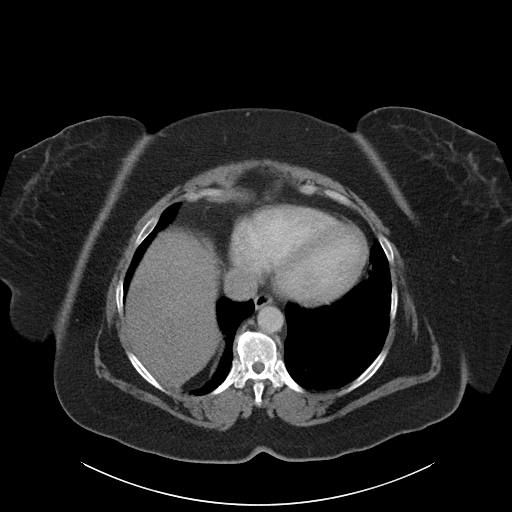

[Series 5: coronal soft tissue · coronal · 0.91mm/px · 3 of 133 slices shown]
[im 45/133  soft-tissue]
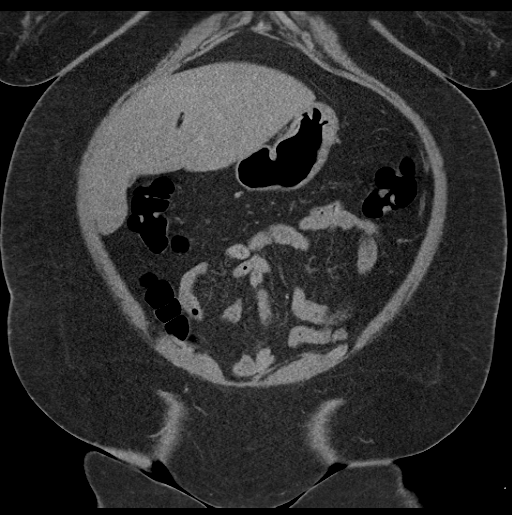
[im 59/133  soft-tissue]
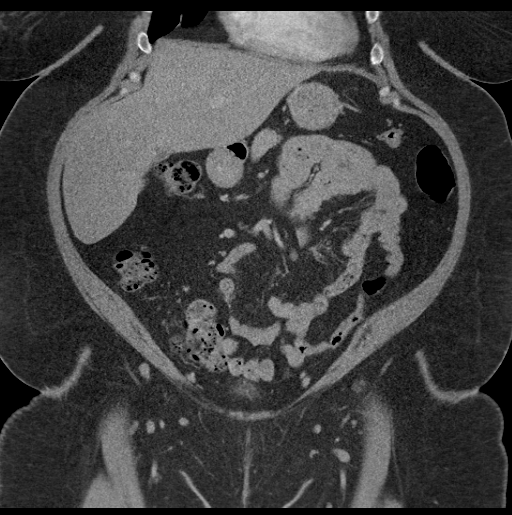
[im 74/133  soft-tissue]
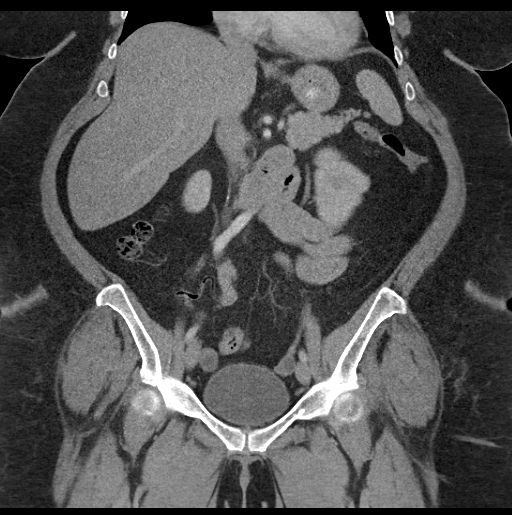

[17 of 46 positions shown; findings below may reference images not displayed]

FINDINGS: Lower chest and abdominal wall:  No contributory findings.

Hepatobiliary: No focal liver abnormality. Low-density seen on 4887
noncontrast exam is not identified today. Possible hepatic
steatosis. No evidence of biliary obstruction or stone.

Pancreas: Unremarkable.

Spleen: Unremarkable.

Adrenals/Urinary Tract: Negative adrenals. No hydronephrosis or
stone. Unremarkable bladder.

Stomach/Bowel:  No obstruction. No appendicitis.

Reproductive:Hysterectomy. 19 mm right ovarian cyst which is simple
by CT and likely incidental at this size.

Vascular/Lymphatic: No acute vascular abnormality. No mass or
adenopathy.

Other: No ascites or pneumoperitoneum.

Musculoskeletal: No acute abnormalities.
IMPRESSION: No explanation for abdominal pain.

## 2018-08-05 ENCOUNTER — Ambulatory Visit (HOSPITAL_COMMUNITY)
Admission: RE | Admit: 2018-08-05 | Discharge: 2018-08-05 | Disposition: A | Discharge status: Home / Self Care | Payer: Medicaid Other | Source: Ambulatory Visit | Attending: Internal Medicine | Admitting: Internal Medicine

## 2018-08-05 ENCOUNTER — Telehealth (HOSPITAL_COMMUNITY): Payer: Self-pay

## 2018-08-05 ENCOUNTER — Other Ambulatory Visit: Payer: Self-pay

## 2018-08-05 DIAGNOSIS — J449 Chronic obstructive pulmonary disease, unspecified: Secondary | ICD-10-CM

## 2018-08-05 DIAGNOSIS — I5032 Chronic diastolic (congestive) heart failure: Secondary | ICD-10-CM

## 2018-08-05 NOTE — Addendum Note (Signed)
Encounter addended by: Harvie Junior, CMA on: 08/05/2018 12:17 PM  Actions taken: Clinical Note Signed

## 2018-08-05 NOTE — Telephone Encounter (Signed)
Attempted to go over AVS with pt, who yelled she was driving and hung up. Will mail AVS.

## 2018-08-05 NOTE — Progress Notes (Addendum)
Compression hose order faxed to PCP.  AVS mailed

## 2018-08-05 NOTE — Progress Notes (Signed)
Heart Failure TeleHealth Note  Due to national recommendations of social distancing due to [place] 19, telehealth visit is felt to be most appropriate for this patient at this time.  I discussed the limitations, risks, security and privacy concerns of performing an evaluation and management service by telephone and the availability of in person appointments. I also discussed with the patient that there may be a patient responsible charge related to this service. The patient expressed understanding and agreed to proceed.   ID:  Michele Owens, DOB [birth year], MRN [id number]  Location: Home  Provider location: [address] [place] Type of Visit: Established patient   PCP:  [name], MD  Cardiologist:  No primary care provider on file. Primary HF: Dr Aundra Dubin  Chief Complaint: CHF follow up   History of Present Illness: Michele Owens is a 58 y.o. female with a history of HTN, obesity, OSA, COPD, hyperlipidemia, diastolic CHF, and SVT.   She was admitted in 3/29 with diastolic CHF and COPD exacerbation. She was admitted in 1/17 with COPD exacerbation. She was set up for a Cardiolite in 2/17. This showed a small, moderate intensity partially reversible inferolateral defect. LHC/RHC in 3/17 showed no significant CAD, mildly elevated PCWP. PFTs in 3/17 showed severe COPD.   Admitted 10/15-10/19/17 with AECOPD and A/CdiastolicCHF. Diuresed with IV lasix. Completed course for CAP. Pt also noted have multinodular goiter on [place]. 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   Echo in 4/18 showed EF 45-50%, moderate dilated RV with moderately decreased systolic function.   She was admitted again in 4/18 with COPD exacerbation, acute on chronic diastolic CHF, and CAP.   She was admitted in 3/20 with acute on chronic diastolic CHF and treated with IV Lasix.  Echo showed EF 60-65%, mild RV enlargement with normal function, respirophasic septal  variation.   Patient presents via audio conferencing for a telehealth visit today. She had a virtual visit with Dr Aundra Dubin on 4/15. She was referred to paramedicine due to med errors. Lasix was increased. Her [place] RN called 4/21 and reported 9 lb weight loss and improvement in symptoms. Labs stable on 4/22. Overall doing better. No longer SOB. Wears O2 qHS and PRN. No edema or PND. Chronic orthopnea. No dizziness. Good UOP with lasix. Weight is down to 300 lbs. Limiting fluid and salt. Not at home so she cannot verify her medications. She is out running errands and is fixated on getting a prescription for compression hose from Dr Leontine Locket office.   Pt denies symptoms of cough, fevers, chills, or new SOB worrisome for COVID 19.    Past Medical History:  Diagnosis Date  . Anxiety   . Arthritis   . Asthma   . Chronic diastolic CHF (congestive heart failure) ([place])   . COPD (chronic obstructive pulmonary disease) ([facility])    Uses Oxygen at night  . Depression   . Diabetes mellitus without complication ([place])   . Dyspnea    occ  . Family history of thyroid cancer   . GERD (gastroesophageal reflux disease)   . Gout   . Headache    migraines  . History of DVT of lower extremity   . History of nuclear stress test    Myoview 2/17:  Low risk stress nuclear study with a small, moderate intensity, partially reversible inferior lateral defect consistent with small prior infarct and minimal peri-infarct ischemia; EF 68 with normal wall motion  . Hypertension   .  Pneumonia   . Thyroid cancer ([place]) 09/23/2016   Past Surgical History:  Procedure Laterality Date  . BUNIONECTOMY Bilateral   . CARDIAC CATHETERIZATION N/A 06/23/2015   Procedure: Right/Left Heart Cath and Coronary Angiography;  Surgeon: [name], MD;  Location: [facility];  Service: Cardiovascular;  Laterality: N/A;  . COLONOSCOPY WITH PROPOFOL N/A 12/15/2015   Procedure: COLONOSCOPY WITH PROPOFOL;  Surgeon: [name], MD;   Location: [facility];  Service: Endoscopy;  Laterality: N/A;  . THYROIDECTOMY  09/19/2016  . THYROIDECTOMY N/A 09/19/2016   Procedure: TOTAL THYROIDECTOMY;  Surgeon: [name], MD;  Location: [facility];  Service: General;  Laterality: N/A;  . TONSILLECTOMY    . TOTAL ABDOMINAL HYSTERECTOMY  07/14/10     Current Outpatient Medications  Medication Sig Dispense Refill  . furosemide (LASIX) 20 MG tablet Take 2 tablets (40 mg total) by mouth 2 (two) times daily. Take 47m every morning and 468mevery evening (Patient taking differently: Take 40 mg by mouth 2 (two) times daily. Take 4073mvery morning and 47m82mery evening) 360 tablet 0  . albuterol (PROVENTIL HFA;VENTOLIN HFA) 108 (90 Base) MCG/ACT inhaler Inhale 1-2 puffs into the lungs every 6 (six) hours as needed for wheezing or shortness of breath. 1 Inhaler 0  . allopurinol (ZYLOPRIM) 100 MG tablet Take 100 mg by mouth daily.    . ANJearl KlinefelterIPTA 62.5-25 MCG/INH AEPB Inhale 1 puff into the lungs daily.     . Blood Glucose Monitoring Suppl (ACCU-CHEK AVIVA PLUS) w/Device KIT USE AS DIRECTED TO CHECK BLOOD SUGAR  0  . butalbital-acetaminophen-caffeine (FIORICET, ESGIC) 50-325-40 MG tablet Take 1-2 tablets by mouth every 4 (four) hours as needed for migraine.    . calcitRIOL (ROCALTROL) 0.5 MCG capsule Take 1 capsule (0.5 mcg total) by mouth daily. 30 capsule 0  . carvedilol (COREG) 6.25 MG tablet Take 1 tablet (6.25 mg total) by mouth 2 (two) times daily with a meal. 60 tablet 0  . cetirizine (ZYRTEC) 10 MG tablet Take 10 mg by mouth daily.  2  . clonazePAM (KLONOPIN) 2 MG tablet Take 2 mg by mouth 3 (three) times daily. for anxiety  1  . diclofenac sodium (VOLTAREN) 1 % GEL Apply 2 g topically 4 (four) times daily as needed (for pain). 1 Tube 0  . EPINEPHrine 0.3 mg/0.3 mL IJ SOAJ injection Inject 0.3 mg into the muscle daily as needed (allergic reaction).    . FEROSUL 325 (65 Fe) MG tablet Take 325 mg by mouth daily.  3  . glucose blood test  strip Use as instructed to check blood sugar up to 4 times daily 100 each 0  . guaiFENesin (MUCINEX) 600 MG 12 hr tablet Take 2 tablets (1,200 mg total) by mouth 2 (two) times daily. 60 tablet 0  . HYDROcodone-acetaminophen (NORCO/VICODIN) 5-325 MG tablet Take 1 tablet by mouth every 6 (six) hours as needed for moderate pain.   0  . ipratropium-albuterol (DUONEB) 0.5-2.5 (3) MG/3ML SOLN Take 3 mLs by nebulization 4 (four) times daily.   3  . levothyroxine (SYNTHROID, LEVOTHROID) 112 MCG tablet Take 224 mcg by mouth daily.  5  . lidocaine (LIDODERM) 5 % Place 1 patch onto the skin daily. Remove & Discard patch within 12 hours or as directed by MD 5 patch 0  . lisinopril (PRINIVIL,ZESTRIL) 5 MG tablet Take 1 tablet (5 mg total) by mouth daily. Please call for office visit [phone] 0  . loperamide (IMODIUM) 2 MG  capsule Take 1 capsule (2 mg total) by mouth 4 (four) times daily as needed for diarrhea or loose stools. 12 capsule 0  . metFORMIN (GLUCOPHAGE) 500 MG tablet Take 1 tablet (500 mg total) by mouth 2 (two) times daily with a meal. 30 tablet 1  . methocarbamol (ROBAXIN) 500 MG tablet Take 1 tablet (500 mg total) by mouth every 8 (eight) hours as needed for muscle spasms. 20 tablet 0  . metoCLOPramide (REGLAN) 10 MG tablet Take 1 tablet (10 mg total) by mouth every 6 (six) hours as needed for nausea (or headache). 30 tablet 0  . montelukast (SINGULAIR) 10 MG tablet Take 1 tablet (10 mg total) by mouth at bedtime. 30 tablet 0  . nitroGLYCERIN (NITROSTAT) 0.4 MG SL tablet Place 1 tablet (0.4 mg total) under the tongue every 5 (five) minutes as needed for chest pain. 12 tablet 0  . nystatin cream (MYCOSTATIN) Apply 1 application topically 2 (two) times daily as needed for dry skin.   1  . omeprazole (PRILOSEC) 40 MG capsule Take 40 mg by mouth daily.  2  . OXYGEN Inhale 2 L into the lungs at bedtime.    . potassium chloride SA (K-DUR,KLOR-CON) 20 MEQ tablet Take 1 tablet (20 mEq total)  by mouth 2 (two) times daily. 180 tablet 0  . pravastatin (PRAVACHOL) 40 MG tablet Take 40 mg by mouth every evening.   2  . QC STOOL SOFTENER PLS LAXATIVE 8.6-50 MG tablet Take 1 tablet by mouth at bedtime.  2  . spironolactone (ALDACTONE) 100 MG tablet Take 100 mg by mouth daily.  3  . tiotropium (SPIRIVA) 18 MCG inhalation capsule Place 18 mcg into inhaler and inhale daily.     No current facility-administered medications for this encounter.     Allergies:   Bee venom; Fioricet [butalbital-apap-caffeine]; Ibuprofen; Lamisil [terbinafine]; and Nsaids   Social History:  The patient  reports that she quit smoking about 2 years ago. Her smoking use included cigarettes. She has a 20.50 pack-year smoking history. She has never used smokeless tobacco. She reports previous alcohol use. She reports that she does not use drugs.   Family History:  The patient's family history includes Brain cancer in her paternal aunt; Cancer in her cousin, cousin, father, and maternal uncle; Diabetes in her mother; Thyroid cancer in her mother.   ROS:  Please see the history of present illness.   All other systems are personally reviewed and negative.    Exam:  (Video/Tele Health Call; Exam is subjective and or/visual.) General:  Speaks in full sentences. No resp difficulty. Lungs: Normal respiratory effort with conversation.  Abdomen: non distended per patient report Extremities: Pt denies edema. Neuro: Alert & oriented x 3.   Recent Labs: 06/18/2018: TSH 2.149 07/04/2018: B Natriuretic Peptide 67.5 07/10/2018: ALT 19; Hemoglobin 12.1; Magnesium 2.0; Platelets 265 07/31/2018: BUN 13; Creatinine, Ser 0.60; Potassium 4.0; Sodium 139  Personally reviewed   Wt Readings from Last 3 Encounters:  07/11/18 (!) 137.7 kg (303 lb 9.6 oz)  06/27/18 (!) 141.5 kg (312 lb)  06/09/18 (!) 141.5 kg (312 lb)      ASSESSMENT AND PLAN:  1. Chronic diastolic CHF: Echo in 4/96 with EF 60-65%, mildly dilated RV with normal  systolic function, respiratory variation of the IV septum =>most likely due to severe pulmonary disease, less likely constrictive pericarditis (normal-appearing pericardium on CT chest in 3/20).  - NYHA class III dyspnea, likely combination of CHF and COPD.  - Volume sounds  improved. Continue lasix 40 mg am, 20 mg pm. Creatinine stable 0.60 4/22 - Cardiomems process started last visit. - Referred for HF paramedicine. Did not answer Heather's call on 4/22. She is planning to call her back today.  2. COPD: Patient has history of severe COPD - Wears O2 qHS and PRN   COVID screen The patient does not have any symptoms that suggest any further testing/ screening at this time.  Social distancing reinforced today. Refer to Meal Program.   Patient Risk: After full review of this patients clinical status, I feel that they are at moderate risk for cardiac decompensation at this time.  Orders/Follow up: She would like to pick up compression hose Rx from Dr Leontine Locket office. She requests that we call and let them know as she is out running errands near there currently. Follow up in 4 weeks. Encouraged her to call paramedic back.   Today, I have spent 12 minutes with the patient with telehealth technology discussing the above issues.  Signed, Georgiana Shore, NP  08/05/2018 11:19 AM   [facility] [address] [facility] and [place] [address] [phone] (office) [phone] (fax)

## 2018-08-05 NOTE — Addendum Note (Signed)
Encounter addended by: Jovita Kussmaul, RN on: 08/05/2018 11:42 AM  Actions taken: Clinical Note Signed

## 2018-08-05 NOTE — Patient Instructions (Addendum)
Please call Para-medicine to set up an appointment for then to come out to do home visits.  Follow up in 4 weeks with [name], this has been scheduled for 27 May @ 9:30 am. This will be a virtual visit.  We have referred you to our Education officer, museum, who will help set you up with a meal delivery program.   We have faxed a prescription for compression socks to Dr Leontine Locket office.

## 2018-08-05 NOTE — Addendum Note (Signed)
Encounter addended by: Harvie Junior, CMA on: 08/05/2018 12:13 PM  Actions taken: Clinical Note Signed

## 2018-08-06 ENCOUNTER — Telehealth: Payer: Self-pay | Admitting: Podiatry

## 2018-08-06 NOTE — Telephone Encounter (Signed)
I called Michele Owens from my personal number with the number blocked to confirm her appointment with [place] at 9:15 am tomorrow and to ask her pre-screening questions. Pt answered and wanted to know why I was calling from a blocked number which I explained. When I told her that our hours had changed and her appt was now 9:15 instead of 8:45 pt got upset because her transportation will have her there at 8:45 and will have to come back to pick her up. Stated she has to give them a three day notice. Pt then told me I needed to make it quick because she was in the middle of a breathing treatment. When I went to start asking her the pre-screening questions she just kept saying "no" and interrupting me saying she saw her doctor yesterday. I told the pt it would be best for her to call our office and speak with someone there.

## 2018-08-06 NOTE — Telephone Encounter (Signed)
Patient called the office in regards to her appointment for tomorrow at 9:15AM. Patient was upset about the new hours due to her transportation and having to wait outside. Pt was informed that she would be able to get in the office at 9:00 and would see the doctor at 9:15 so she will not have to wait outside of the building in the cold. Patient confirmed new time and stated she would be at the appt.

## 2018-08-07 ENCOUNTER — Other Ambulatory Visit: Payer: Self-pay

## 2018-08-07 ENCOUNTER — Encounter: Payer: Self-pay | Admitting: Podiatry

## 2018-08-07 ENCOUNTER — Ambulatory Visit (INDEPENDENT_AMBULATORY_CARE_PROVIDER_SITE_OTHER): Payer: Medicaid Other | Admitting: Podiatry

## 2018-08-07 VITALS — Temp 97.2°F

## 2018-08-07 DIAGNOSIS — M79675 Pain in left toe(s): Secondary | ICD-10-CM

## 2018-08-07 DIAGNOSIS — B351 Tinea unguium: Secondary | ICD-10-CM | POA: Diagnosis not present

## 2018-08-07 DIAGNOSIS — M79674 Pain in right toe(s): Secondary | ICD-10-CM

## 2018-08-07 DIAGNOSIS — M722 Plantar fascial fibromatosis: Secondary | ICD-10-CM | POA: Diagnosis not present

## 2018-08-07 DIAGNOSIS — E1142 Type 2 diabetes mellitus with diabetic polyneuropathy: Secondary | ICD-10-CM

## 2018-08-07 NOTE — Progress Notes (Signed)
SUBJECTIVE: Michele Owens presents to clinic on today with cc of painful left heel.  Patient admits pain with first step in the morning and after periods of rest. Duration of pain is several weeks.  On a scale of 1-10, patient relates pain level is a 8-9 . Prior treatments include steroid injections.  She has received her diabetic shoes and is pleased with them.  She reports no problems on today's visit with them.  States she was just released from hospital for fluid overload. She relates they were able to get 30 pounds off.  Past Medical History:  Diagnosis Date  . Anxiety   . Arthritis   . Asthma   . Chronic diastolic CHF (congestive heart failure) (HCC)   . COPD (chronic obstructive pulmonary disease) (HCC)    Uses Oxygen at night  . Depression   . Diabetes mellitus without complication (HCC)   . Dyspnea    occ  . Family history of thyroid cancer   . GERD (gastroesophageal reflux disease)   . Gout   . Headache    migraines  . History of DVT of lower extremity   . History of nuclear stress test    Myoview 2/17:  Low risk stress nuclear study with a small, moderate intensity, partially reversible inferior lateral defect consistent with small prior infarct and minimal peri-infarct ischemia; EF 68 with normal wall motion  . Hypertension   . Pneumonia   . Thyroid cancer (HCC) 09/23/2016     Patient Active Problem List   Diagnosis Date Noted  . Morbid obesity with BMI of 50.0-59.9, adult (HCC)   . CHF exacerbation (HCC) 07/04/2018  . Acute heart failure (HCC) 07/04/2018  . Fall at home, initial encounter 06/23/2018  . HNP (herniated nucleus pulposus), thoracic 06/23/2018  . Sprain of shoulder, left 06/18/2018  . Strain of thoracic back region 06/18/2018  . Strain of lumbar region 06/18/2018  . CHF (congestive heart failure) (HCC) 06/18/2018  . Pain 06/17/2018  . Hypertensive disorder 08/28/2017  . Obesity 08/28/2017  . COPD exacerbation (HCC) 08/04/2017  . Acute and chronic  respiratory failure with hypercapnia (HCC)   . Acute on chronic diastolic CHF (congestive heart failure) (HCC)   . Pulmonary HTN (HCC)   . Genetic testing 02/27/2017  . Family history of thyroid cancer   . Hypomagnesemia 10/03/2016  . Boil, breast 10/03/2016  . Atypical chest pain   . HLD (hyperlipidemia) 09/23/2016  . COPD (chronic obstructive pulmonary disease) (HCC) 09/23/2016  . Gout 09/23/2016  . DVT (deep vein thrombosis) in pregnancy 09/23/2016  . Thyroid cancer (HCC) 09/23/2016  . Hypocalcemia 09/23/2016  . AKI (acute kidney injury) (HCC) 09/23/2016  . 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 Acute pulmonary edema (HCC)   . Acute hypoxemic respiratory failure (HCC)   . Acute on chronic respiratory failure with hypoxemia (HCC)   . Ventilator dependent (HCC)   . Respiratory failure (HCC) 07/16/2016  . Chronic combined systolic (congestive) and diastolic (congestive) heart failure (HCC) 02/10/2016  . Dyspnea   . CAP (community acquired pneumonia) 01/23/2016  . 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 Pulmonary hypertension (HCC) 01/23/2016  . Sepsis (HCC) 01/23/2016  . Obesity hypoventilation syndrome (HCC) 08/26/2015  . Chest pain 06/16/2015  . Dyslipidemia associated with type 2 diabetes mellitus (HCC) 04/24/2015  . Leukocytosis 04/24/2015  . Controlled type 2 diabetes mellitus without complication, without long-term current use of insulin (HCC) 04/24/2015  . Anxiety 04/24/2015  .   Benign essential HTN 04/24/2015  . Normocytic anemia 04/24/2015  . Asthma with COPD with exacerbation ([place]) 04/24/2015  . OSA (obstructive sleep apnea) 05/10/2012  . Tobacco abuse 07/04/2011     Past Surgical History:  Procedure Laterality Date  . BUNIONECTOMY Bilateral   . CARDIAC CATHETERIZATION N/A 06/23/2015   Procedure: Right/Left Heart Cath and Coronary Angiography;  Surgeon: [name], MD;  Location: [facility];  Service:  Cardiovascular;  Laterality: N/A;  . COLONOSCOPY WITH PROPOFOL N/A 12/15/2015   Procedure: COLONOSCOPY WITH PROPOFOL;  Surgeon: [name], MD;  Location: [facility];  Service: Endoscopy;  Laterality: N/A;  . THYROIDECTOMY  09/19/2016  . THYROIDECTOMY N/A 09/19/2016   Procedure: TOTAL THYROIDECTOMY;  Surgeon: [name], MD;  Location: [facility];  Service: General;  Laterality: N/A;  . TONSILLECTOMY    . TOTAL ABDOMINAL HYSTERECTOMY  07/14/10      Current Outpatient Medications:  .  albuterol (PROVENTIL HFA;VENTOLIN HFA) 108 (90 Base) MCG/ACT inhaler, Inhale 1-2 puffs into the lungs every 6 (six) hours as needed for wheezing or shortness of breath., Disp: 1 Inhaler, Rfl: 0 .  allopurinol (ZYLOPRIM) 100 MG tablet, Take 100 mg by mouth daily., Disp: , Rfl:  .  ANORO ELLIPTA 62.5-25 MCG/INH AEPB, Inhale 1 puff into the lungs daily. , Disp: , Rfl:  .  Blood Glucose Monitoring Suppl (ACCU-CHEK AVIVA PLUS) w/Device KIT, USE AS DIRECTED TO CHECK BLOOD SUGAR, Disp: , Rfl: 0 .  butalbital-acetaminophen-caffeine (FIORICET, ESGIC) 50-325-40 MG tablet, Take 1-2 tablets by mouth every 4 (four) hours as needed for migraine., Disp: , Rfl:  .  calcitRIOL (ROCALTROL) 0.5 MCG capsule, Take 1 capsule (0.5 mcg total) by mouth daily., Disp: 30 capsule, Rfl: 0 .  carvedilol (COREG) 6.25 MG tablet, Take 1 tablet (6.25 mg total) by mouth 2 (two) times daily with a meal., Disp: 60 tablet, Rfl: 0 .  cetirizine (ZYRTEC) 10 MG tablet, Take 10 mg by mouth daily., Disp: , Rfl: 2 .  clonazePAM (KLONOPIN) 2 MG tablet, Take 2 mg by mouth 3 (three) times daily. for anxiety, Disp: , Rfl: 1 .  diclofenac sodium (VOLTAREN) 1 % GEL, Apply 2 g topically 4 (four) times daily as needed (for pain)., Disp: 1 Tube, Rfl: 0 .  EPINEPHrine 0.3 mg/0.3 mL IJ SOAJ injection, Inject 0.3 mg into the muscle daily as needed (allergic reaction)., Disp: , Rfl:  .  FEROSUL 325 (65 Fe) MG tablet, Take 325 mg by mouth daily., Disp: , Rfl: 3 .  furosemide  (LASIX) 20 MG tablet, Take 2 tablets (40 mg total) by mouth 2 (two) times daily. Take 48m every morning and 410mevery evening (Patient taking differently: Take 40 mg by mouth 2 (two) times daily. Take 4021mvery morning and 75m39mery evening), Disp: 360 tablet, Rfl: 0 .  glucose blood test strip, Use as instructed to check blood sugar up to 4 times daily, Disp: 100 each, Rfl: 0 .  guaiFENesin (MUCINEX) 600 MG 12 hr tablet, Take 2 tablets (1,200 mg total) by mouth 2 (two) times daily., Disp: 60 tablet, Rfl: 0 .  HYDROcodone-acetaminophen (NORCO/VICODIN) 5-325 MG tablet, Take 1 tablet by mouth every 6 (six) hours as needed for moderate pain. , Disp: , Rfl: 0 .  ipratropium-albuterol (DUONEB) 0.5-2.5 (3) MG/3ML SOLN, Take 3 mLs by nebulization 4 (four) times daily. , Disp: , Rfl: 3 .  levothyroxine (SYNTHROID, LEVOTHROID) 112 MCG tablet, Take 224 mcg by mouth daily., Disp: , Rfl: 5 .  lidocaine (LIDODERM) 5 %, Place 1 patch onto the skin daily. Remove & Discard patch within 12 hours or as directed by MD, Disp: 5 patch, Rfl: 0 .  lisinopril (PRINIVIL,ZESTRIL) 5 MG tablet, Take 1 tablet (5 mg total) by mouth daily. Please call for office visit [phone], Disp: 30 tablet, Rfl: 0 .  loperamide (IMODIUM) 2 MG capsule, Take 1 capsule (2 mg total) by mouth 4 (four) times daily as needed for diarrhea or loose stools., Disp: 12 capsule, Rfl: 0 .  metFORMIN (GLUCOPHAGE) 500 MG tablet, Take 1 tablet (500 mg total) by mouth 2 (two) times daily with a meal., Disp: 30 tablet, Rfl: 1 .  methocarbamol (ROBAXIN) 500 MG tablet, Take 1 tablet (500 mg total) by mouth every 8 (eight) hours as needed for muscle spasms., Disp: 20 tablet, Rfl: 0 .  metoCLOPramide (REGLAN) 10 MG tablet, Take 1 tablet (10 mg total) by mouth every 6 (six) hours as needed for nausea (or headache)., Disp: 30 tablet, Rfl: 0 .  montelukast (SINGULAIR) 10 MG tablet, Take 1 tablet (10 mg total) by mouth at bedtime., Disp: 30 tablet, Rfl: 0 .   nitroGLYCERIN (NITROSTAT) 0.4 MG SL tablet, Place 1 tablet (0.4 mg total) under the tongue every 5 (five) minutes as needed for chest pain., Disp: 12 tablet, Rfl: 0 .  nystatin cream (MYCOSTATIN), Apply 1 application topically 2 (two) times daily as needed for dry skin. , Disp: , Rfl: 1 .  omeprazole (PRILOSEC) 40 MG capsule, Take 40 mg by mouth daily., Disp: , Rfl: 2 .  OXYGEN, Inhale 2 L into the lungs at bedtime., Disp: , Rfl:  .  potassium chloride SA (K-DUR,KLOR-CON) 20 MEQ tablet, Take 1 tablet (20 mEq total) by mouth 2 (two) times daily., Disp: 180 tablet, Rfl: 0 .  pravastatin (PRAVACHOL) 40 MG tablet, Take 40 mg by mouth every evening. , Disp: , Rfl: 2 .  QC STOOL SOFTENER PLS LAXATIVE 8.6-50 MG tablet, Take 1 tablet by mouth at bedtime., Disp: , Rfl: 2 .  spironolactone (ALDACTONE) 100 MG tablet, Take 100 mg by mouth daily., Disp: , Rfl: 3 .  tiotropium (SPIRIVA) 18 MCG inhalation capsule, Place 18 mcg into inhaler and inhale daily., Disp: , Rfl:    Allergies  Allergen Reactions  . Bee Venom Swelling and Other (See Comments)    "All over my body" (swelling)  . Fioricet [Butalbital-Apap-Caffeine] Nausea And Vomiting and Rash  . Ibuprofen Rash and Other (See Comments)    Severe rash  . Lamisil [Terbinafine] Rash and Other (See Comments)    Pt states this causes her to "feel funny"  . Nsaids Other (See Comments)    Per MD's orders      Social History   Occupational History  . Occupation: unemployeed  Tobacco Use  . Smoking status: Former Smoker    Packs/day: 0.50    Years: 41.00    Pack years: 20.50    Types: Cigarettes    Last attempt to quit: 07/2016    Years since quitting: 2.0  . Smokeless tobacco: Never Used  . Tobacco comment: Starting to Wean Off -- using Nicotine Patch  Substance and Sexual Activity  . Alcohol use: Not Currently    Alcohol/week: 0.0 standard drinks    Comment: ? none now  . Drug use: Never    Comment: none for long time  . Sexual activity:  Yes     OBJECTIVE: Vitals:   08/07/18 0927  Temp: (!) 97.2 F (36.2 C)     Vascular Examination: Capillary refill time <4 seconds x 10 Dorsalis pedis and Posterior tibial pulses 1/4 b/l Digital hair x 10 digits was absent  Skin temperature gradient WNL b/l Edema BLE  Dermatological Examination: No open wounds b/l  No interdigital macerations b/l  Anonychia b/l great toes and right 2nd digit with epithelialization   Toenails 3-5 right , 2-5 left discolored, thick, dystrophic with subungual debris and pain with palpation to nailbeds due to thickness of nails.  Musculoskeletal: Muscle strength 5/5 to all LE muscle groups b/l.  Mild digital contractures b/l 2-5  Negative Tinel's sign b/l.  Pain on palpation noted medial tubercle left heel.  ASSESSMENT: 1. Onychomycosis  3-5 right , 2-5 left 2. Plantar fasciitis left heel   PLAN: 1. Patient examined today. Pain level left heel warrants steroid injection. 2. Toenails   3-5 right , 2-5 left debrided in length and girth without incident. 3. Discussed etiology, pathology, conservative vs.surgical therapies. At this time a plantar fascial injection was recommended.  The patient agreed and a sterile skin prep was applied.  A 2cc injection consisting of 1 cc kenalog 10 and 1/2 cc 0.5% marcaine plain and 1/2 cc 1% Lidocaine plain mixture was infiltrated at the point of maximal tenderness on the left Heel.  The patient tolerated this well and was given instructions for aftercare.  4. She has new diabetic shoes with inserts.  5. Report any pedal injuries to medical professional immediately. 6. Follow up 3 months.

## 2018-08-07 NOTE — Patient Instructions (Signed)

## 2018-08-12 ENCOUNTER — Telehealth (HOSPITAL_COMMUNITY): Payer: Self-pay | Admitting: Licensed Clinical Social Worker

## 2018-08-12 ENCOUNTER — Encounter: Payer: Self-pay | Admitting: Podiatry

## 2018-08-12 NOTE — Telephone Encounter (Signed)
CSW received consult to speak with pt regarding food concerns.  Pt reports that she doesn't have anyone helping her get groceries and has been taking a taxi out to grocery stores but this has gotten expensive and puts her at risk by having unnecessary contact with other people.  CSW discussed CV food delivery program with patient.  Patient is agreeable to receiving 7 prepared meals a week through this program but patient does not eat beef or pork and wants to make sure that is not included in her meal deliveries.  Pt expressed understanding that food would be delivered to her front door with no face to face contact.  CSW placed referral to food program and informed them of preference for no beef or pork  CSW will continue to follow and assist as needed  Jorge Ny, [facility] Desk#: [phone] Cell#: [phone]

## 2018-08-15 ENCOUNTER — Telehealth (HOSPITAL_COMMUNITY): Payer: Self-pay

## 2018-08-15 ENCOUNTER — Telehealth (HOSPITAL_COMMUNITY): Payer: Self-pay | Admitting: Licensed Clinical Social Worker

## 2018-08-15 NOTE — Telephone Encounter (Signed)
Reached out to [place] who was very hesitant of me coming out for an initial visit. She stated she was not informed of me coming out to the house or anyone for that matter to assist her with medications or "checking on her". She stated she understood her medications just fine and she did not really need help but she wanted to hear from Dr. Aundra Dubin to confirm I am who I say I am and that I am affiliated with the [facility]. I will be sending a message over to the clinic for them to follow up with her and I advised her of same. I told her I would call back Monday to see if she agreed to home visits.

## 2018-08-15 NOTE — Telephone Encounter (Signed)
CSW informed by community paramedic that pt is refusing to see her at this time as she does not think this is a program that her MD office recommended for her.  Pt is unwilling to see the paramedic until she gets a call from her provider confirming that this program is being run through our office.  CSW sent message to provider to request they reach out and assure patient that this program is through the [facility] and is being recommended by the MD.  CSW will continue to follow and assist as needed  Jorge Ny, [facility] Desk#: [phone] Cell#: [phone]

## 2018-08-19 ENCOUNTER — Telehealth (HOSPITAL_COMMUNITY): Payer: Self-pay | Admitting: Licensed Clinical Social Worker

## 2018-08-19 NOTE — Telephone Encounter (Signed)
CSW contacted patient to follow up on weekly food delivery package. Patient informed of delivery time and no face to face contact during delivery. Patient verbalizes understanding and grateful for the assistance.  CSW continues to follow for supportive needs. Jackie Lucilia Yanni, LCSW, CCSW-MCS [phone]  

## 2018-08-26 ENCOUNTER — Telehealth (HOSPITAL_COMMUNITY): Payer: Self-pay | Admitting: Licensed Clinical Social Worker

## 2018-08-26 NOTE — Telephone Encounter (Signed)
CSW contacted patient who states she would like to be put on hold for the food program for the time being. Patient states she will reach out to CSW when needed. Raquel Sarna, [place], [place]

## 2018-08-27 ENCOUNTER — Telehealth (HOSPITAL_COMMUNITY): Payer: Self-pay

## 2018-08-27 ENCOUNTER — Other Ambulatory Visit (HOSPITAL_COMMUNITY): Payer: Self-pay

## 2018-08-27 NOTE — Progress Notes (Signed)
Paramedicine Encounter    Patient ID: Michele Owens, female    DOB: [birth year], 59 y.o.   MRN: [id number]    Patient Care Team: [name], MD as PCP - General (Internal Medicine) Sharmon Revere as Referring Physician (Physician Assistant)  Patient Active Problem List   Diagnosis Date Noted  . Morbid obesity with BMI of 50.0-59.9, adult ([place])   . CHF exacerbation ([place]) 07/04/2018  . Acute heart failure ([place]) 07/04/2018  . Fall at home, initial encounter 06/23/2018  . HNP (herniated nucleus pulposus), thoracic 06/23/2018  . Sprain of shoulder, left 06/18/2018  . Strain of thoracic back region 06/18/2018  . Strain of lumbar region 06/18/2018  . CHF (congestive heart failure) ([place]) 06/18/2018  . Pain 06/17/2018  . Hypertensive disorder 08/28/2017  . Obesity 08/28/2017  . COPD exacerbation ([place]) 08/04/2017  . Acute and chronic respiratory failure with hypercapnia ([place])   . Acute on chronic diastolic CHF (congestive heart failure) ([place])   . Pulmonary HTN ([place])   . Genetic testing 02/27/2017  . Family history of thyroid cancer   . Hypomagnesemia 10/03/2016  . Boil, breast 10/03/2016  . Atypical chest pain   . HLD (hyperlipidemia) 09/23/2016  . COPD (chronic obstructive pulmonary disease) ([place]) 09/23/2016  . Gout 09/23/2016  . DVT (deep vein thrombosis) in pregnancy 09/23/2016  . Thyroid cancer ([place]) 09/23/2016  . Hypocalcemia 09/23/2016  . AKI (acute kidney injury) ([place]) 09/23/2016  . 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 Acute pulmonary edema ([facility])   . Acute hypoxemic respiratory failure ([place])   . Acute on chronic respiratory failure with hypoxemia ([place])   . Ventilator dependent ([place])   . Respiratory failure ([place]) 07/16/2016  . Chronic combined systolic (congestive) and diastolic (congestive) heart failure ([place]) 02/10/2016  . Dyspnea   . CAP (community acquired pneumonia) 01/23/2016  . 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 Pulmonary hypertension ([place]) 01/23/2016  . Sepsis ([place]) 01/23/2016  . Obesity hypoventilation syndrome ([place]) 08/26/2015  . Chest pain 06/16/2015  . Dyslipidemia associated with type 2 diabetes mellitus ([place]) 04/24/2015  . Leukocytosis 04/24/2015  . Controlled type 2 diabetes mellitus without complication, without long-term current use of insulin ([place]) 04/24/2015  . Anxiety 04/24/2015  . Benign essential HTN 04/24/2015  . Normocytic anemia 04/24/2015  . Asthma with COPD with exacerbation ([place]) 04/24/2015  . OSA (obstructive sleep apnea) 05/10/2012  . Tobacco abuse 07/04/2011    Current Outpatient Medications:  .  allopurinol (ZYLOPRIM) 100 MG tablet, Take 100 mg by mouth daily., Disp: , Rfl:  .  ANORO ELLIPTA 62.5-25 MCG/INH AEPB, Inhale 1 puff into the lungs daily. , Disp: , Rfl:  .  butalbital-acetaminophen-caffeine (FIORICET, ESGIC) 50-325-40 MG tablet, Take 1-2 tablets by mouth every 4 (four) hours as needed for migraine., Disp: , Rfl:  .  calcitRIOL (ROCALTROL) 0.5 MCG capsule, Take 1 capsule (0.5 mcg total) by mouth daily., Disp: 30 capsule, Rfl: 0 .  carvedilol (COREG) 6.25 MG tablet, Take 1 tablet (6.25 mg total) by mouth 2 (two) times daily with a meal., Disp: 60 tablet, Rfl: 0 .  cetirizine (ZYRTEC) 10 MG tablet, Take 10 mg by mouth daily., Disp: , Rfl: 2 .  clonazePAM (KLONOPIN) 2 MG tablet, Take 2 mg by mouth 3 (three) times daily. for anxiety, Disp: , Rfl: 1 .  FEROSUL 325 (65 Fe) MG tablet, Take 325 mg by mouth daily., Disp: , Rfl: 3 .  furosemide (LASIX) 20 MG  tablet, Take 2 tablets (40 mg total) by mouth 2 (two) times daily. Take 49m every morning and 447mevery evening (Patient taking differently: Take 40 mg by mouth 2 (two) times daily. Take 4041mvery morning and 33m65mery evening), Disp: 360 tablet, Rfl: 0 .  glucose blood test strip, Use as instructed to check blood sugar up to 4 times daily, Disp: 100 each, Rfl: 0 .  guaiFENesin (MUCINEX) 600 MG  12 hr tablet, Take 2 tablets (1,200 mg total) by mouth 2 (two) times daily., Disp: 60 tablet, Rfl: 0 .  HYDROcodone-acetaminophen (NORCO/VICODIN) 5-325 MG tablet, Take 1 tablet by mouth every 6 (six) hours as needed for moderate pain. , Disp: , Rfl: 0 .  levothyroxine (SYNTHROID, LEVOTHROID) 112 MCG tablet, Take 224 mcg by mouth daily., Disp: , Rfl: 5 .  lisinopril (PRINIVIL,ZESTRIL) 5 MG tablet, Take 1 tablet (5 mg total) by mouth daily. Please call for office visit [phone]: 30 tablet, Rfl: 0 .  loperamide (IMODIUM) 2 MG capsule, Take 1 capsule (2 mg total) by mouth 4 (four) times daily as needed for diarrhea or loose stools., Disp: 12 capsule, Rfl: 0 .  metFORMIN (GLUCOPHAGE) 500 MG tablet, Take 1 tablet (500 mg total) by mouth 2 (two) times daily with a meal., Disp: 30 tablet, Rfl: 1 .  nitroGLYCERIN (NITROSTAT) 0.4 MG SL tablet, Place 1 tablet (0.4 mg total) under the tongue every 5 (five) minutes as needed for chest pain., Disp: 12 tablet, Rfl: 0 .  omeprazole (PRILOSEC) 40 MG capsule, Take 40 mg by mouth daily., Disp: , Rfl: 2 .  potassium chloride SA (K-DUR,KLOR-CON) 20 MEQ tablet, Take 1 tablet (20 mEq total) by mouth 2 (two) times daily., Disp: 180 tablet, Rfl: 0 .  pravastatin (PRAVACHOL) 40 MG tablet, Take 40 mg by mouth every evening. , Disp: , Rfl: 2 .  albuterol (PROVENTIL HFA;VENTOLIN HFA) 108 (90 Base) MCG/ACT inhaler, Inhale 1-2 puffs into the lungs every 6 (six) hours as needed for wheezing or shortness of breath., Disp: 1 Inhaler, Rfl: 0 .  Blood Glucose Monitoring Suppl (ACCU-CHEK AVIVA PLUS) w/Device KIT, USE AS DIRECTED TO CHECK BLOOD SUGAR, Disp: , Rfl: 0 .  diclofenac sodium (VOLTAREN) 1 % GEL, Apply 2 g topically 4 (four) times daily as needed (for pain)., Disp: 1 Tube, Rfl: 0 .  EPINEPHrine 0.3 mg/0.3 mL IJ SOAJ injection, Inject 0.3 mg into the muscle daily as needed (allergic reaction)., Disp: , Rfl:  .  ipratropium-albuterol (DUONEB) 0.5-2.5 (3) MG/3ML SOLN, Take  3 mLs by nebulization 4 (four) times daily. , Disp: , Rfl: 3 .  lidocaine (LIDODERM) 5 %, Place 1 patch onto the skin daily. Remove & Discard patch within 12 hours or as directed by MD, Disp: 5 patch, Rfl: 0 .  methocarbamol (ROBAXIN) 500 MG tablet, Take 1 tablet (500 mg total) by mouth every 8 (eight) hours as needed for muscle spasms., Disp: 20 tablet, Rfl: 0 .  metoCLOPramide (REGLAN) 10 MG tablet, Take 1 tablet (10 mg total) by mouth every 6 (six) hours as needed for nausea (or headache)., Disp: 30 tablet, Rfl: 0 .  montelukast (SINGULAIR) 10 MG tablet, Take 1 tablet (10 mg total) by mouth at bedtime., Disp: 30 tablet, Rfl: 0 .  nystatin cream (MYCOSTATIN), Apply 1 application topically 2 (two) times daily as needed for dry skin. , Disp: , Rfl: 1 .  OXYGEN, Inhale 2 L into the lungs at bedtime., Disp: , Rfl:  .  QC STOOL SOFTENER  PLS LAXATIVE 8.6-50 MG tablet, Take 1 tablet by mouth at bedtime., Disp: , Rfl: 2 .  spironolactone (ALDACTONE) 100 MG tablet, Take 100 mg by mouth daily., Disp: , Rfl: 3 .  tiotropium (SPIRIVA) 18 MCG inhalation capsule, Place 18 mcg into inhaler and inhale daily., Disp: , Rfl:  Allergies  Allergen Reactions  . Bee Venom Swelling and Other (See Comments)    "All over my body" (swelling)  . Fioricet [Butalbital-Apap-Caffeine] Nausea And Vomiting and Rash  . Ibuprofen Rash and Other (See Comments)    Severe rash  . Lamisil [Terbinafine] Rash and Other (See Comments)    Pt states this causes her to "feel funny"  . Nsaids Other (See Comments)    Per MD's orders      Social History   Socioeconomic History  . Marital status: Single    Spouse name: Not on file  . Number of children: Not on file  . Years of education: Not on file  . Highest education level: Not on file  Occupational History  . Occupation: unemployeed  Social Needs  . Financial resource strain: Not on file  . Food insecurity:    Worry: Not on file    Inability: Not on file  .  Transportation needs:    Medical: Not on file    Non-medical: Not on file  Tobacco Use  . Smoking status: Former Smoker    Packs/day: 0.50    Years: 41.00    Pack years: 20.50    Types: Cigarettes    Last attempt to quit: 07/2016    Years since quitting: 2.1  . Smokeless tobacco: Never Used  . Tobacco comment: Starting to Wean Off -- using Nicotine Patch  Substance and Sexual Activity  . Alcohol use: Not Currently    Alcohol/week: 0.0 standard drinks    Comment: ? none now  . Drug use: Never    Comment: none for long time  . Sexual activity: Yes  Lifestyle  . Physical activity:    Days per week: Not on file    Minutes per session: Not on file  . Stress: Not on file  Relationships  . Social connections:    Talks on phone: Not on file    Gets together: Not on file    Attends religious service: Not on file    Active member of club or organization: Not on file    Attends meetings of clubs or organizations: Not on file    Relationship status: Not on file  . Intimate partner violence:    Fear of current or ex partner: Not on file    Emotionally abused: Not on file    Physically abused: Not on file    Forced sexual activity: Not on file  Other Topics Concern  . Not on file  Social History Narrative  . Not on file    Physical Exam Pulmonary:     Effort: Pulmonary effort is normal. No respiratory distress.     Breath sounds: No wheezing or rales.  Skin:    General: Skin is warm and dry.         Future Appointments  Date Time Provider [place]  09/04/2018  9:30 AM [facility] PA/NP [facility] None  11/06/2018 10:00 AM Marzetta Board, DPM [facility]     BP 124/80 (BP Location: Right Arm, Patient Position: Sitting, Cuff Size: Large)   Pulse 96   Temp (!) 97.4 F (36.3 C)   Resp 12   Wt (!)  303 lb 12.8 oz (137.8 kg)   SpO2 97%   BMI 50.55 kg/m  CBG 120  INITIAL VISIT ATF pt CAO x4, she was cleaning upon my arrival. Pt has no complaints.   She stated that she taken all of her medications up until her evening medications.  Today is our initial visit.  Pt has all of her medications/ she gets them delivered through [facility].  Pt takes her meds directly out of the bottle; she know how and when to take her medications.  She's taking potassium once daily and I advised her that per notes on 07/24/18 she is to take twice daily; she was taking it once.  I advised her that I will f/u with Dr. Jonelle Sidle about the same.  She denies sob, chest pain and dizziness. rx bottles confirmed.  Pt agrees to participate in [facility] visits.  I told her that next week visit will be a little longer because we were unable to complete it today.    Medication ordered: Potassium  Dr. Jonelle Sidle check on loratadine (claritin 10 MG)  Michele Owens, EMT Paramedic [phone] 08/27/2018    ACTION: Next visit planned for tues at 28

## 2018-08-27 NOTE — Telephone Encounter (Signed)
I called pt to confirm that she is refusing the paramedicine program.  Initially she refused with me but she    She stated that she's allergic

## 2018-08-27 NOTE — Telephone Encounter (Signed)
Pt agreed to allow me to come later today.  She agrees to visit at 5.

## 2018-09-03 ENCOUNTER — Telehealth (HOSPITAL_COMMUNITY): Payer: Self-pay | Admitting: Licensed Clinical Social Worker

## 2018-09-03 ENCOUNTER — Telehealth (HOSPITAL_COMMUNITY): Payer: Self-pay

## 2018-09-03 NOTE — Telephone Encounter (Signed)
CSW informed by community paramedic that patient allowed her to come out for one home visit but is no longer agreeable until her PCP office, Dr. Jonelle Sidle, is updated about paramedicine program and current [facility] Failure [facility] recommendations.  CSW called pt MD office and made aware of what the paramedicine program is and that it was our office that changed pts potassium dosage.  CSW given permission by patient to send clinic notes to Dr. Ardelle Anton office for their records- recent patient appt notes sent to their office.  Pt remains confused why she needs more potassium at this time- CSW requested pt paramedic reach out to discuss medical necessity of increased potassium dosage.  Jorge Ny, LCSW Clinical Social Worker [facility] Desk#: [phone] Cell#: [phone]

## 2018-09-03 NOTE — Telephone Encounter (Signed)
I called Eliezer Lofts this morning about the conversation that I had with Mrs. Locicero Friday around 430.  Mrs. Signer called and stated that her" pcp Dr. Jonelle Sidle and Dr. Arnoldo Morale were very upset that Dr. Aundra Dubin put her on another potassium pill and they don't understand why".  She also stated that she didn't feel comfortable allowing me to come back to visit because "her primary doctor doesn't know why I'm coming over to check on her and she want Dr. Aundra Dubin to call her doctor before I come back".    I called and reported the same to [place]. CHP visits are on hold until pt is contacted by Dr. Jonelle Sidle or Dr. Arnoldo Morale office and tell her that it's ok.

## 2018-09-04 ENCOUNTER — Telehealth (HOSPITAL_COMMUNITY): Payer: Medicaid Other

## 2018-09-10 ENCOUNTER — Telehealth (HOSPITAL_COMMUNITY): Payer: Self-pay

## 2018-09-10 NOTE — Telephone Encounter (Signed)
Pt called left vm inquiring about medications.  When called, pt yelled asking to speak with Dr Buddy Duty. Advised no dr with that name is in office. Pt states someone sent her to vm thinking it was dr Kathi Ludwig vm.  Advised this is not dr Kathi Ludwig office.  Pt states "let me hang up because im getting mad"

## 2018-09-12 ENCOUNTER — Other Ambulatory Visit: Payer: Self-pay

## 2018-09-12 ENCOUNTER — Other Ambulatory Visit (HOSPITAL_COMMUNITY): Payer: Self-pay

## 2018-09-12 ENCOUNTER — Encounter (HOSPITAL_COMMUNITY): Payer: Self-pay

## 2018-09-12 ENCOUNTER — Ambulatory Visit (HOSPITAL_COMMUNITY)
Admission: RE | Admit: 2018-09-12 | Discharge: 2018-09-12 | Disposition: A | Discharge status: Home / Self Care | Payer: Medicaid Other | Source: Ambulatory Visit | Attending: Adult Health | Admitting: Adult Health

## 2018-09-12 VITALS — BP 110/70 | Temp 98.4°F | Wt 307.0 lb

## 2018-09-12 DIAGNOSIS — I5032 Chronic diastolic (congestive) heart failure: Secondary | ICD-10-CM | POA: Diagnosis not present

## 2018-09-12 DIAGNOSIS — J449 Chronic obstructive pulmonary disease, unspecified: Secondary | ICD-10-CM

## 2018-09-12 NOTE — Progress Notes (Signed)
Paramedicine Encounter   Patient ID: Michele Owens , female,   DOB: Oct 28, 1960,57 y.o.,  MRN: [id number]   Met patient in her residence for a virtual visit with provider Amy.  Pt stated that she feels good. Amy told Michele Owens why she had the increase in potassium, which pt seem to understand.  Amy also confirmed that pt is no longer taking spirolactone, per the notes in epic.  Michele Owens stated that the nurse took it away yesterday.  She didn't get her pill bag for me to confirm that it was gone.  I will pick up a revised med list from the heart failure clinic to give to her. No medication changes during this visit. She discussed in great length about Dr. Jonelle Sidle and staff during this visit. I will f/u with pt next week.    Time spent with patient 30 mins   [place], [place] 09/12/2018   ACTION: Home visit completed

## 2018-09-12 NOTE — Addendum Note (Signed)
Encounter addended by: Kerry Dory, CMA on: 09/12/2018 11:27 AM  Actions taken: Clinical Note Signed

## 2018-09-12 NOTE — Patient Instructions (Addendum)
It was great to speak with you today! No medication changes are needed at this time.   Your physician recommends that you schedule a follow-up appointment in: 3 months with Dr Aundra Dubin -please feel free to contact us in September 2020, if we have not already called you  Do the following things EVERYDAY: 1) Weigh yourself in the morning before breakfast. Write it down and keep it in a log. 2) Take your medicines as prescribed 3) Eat low salt foods-Limit salt (sodium) to 2000 mg per day.  4) Stay as active as you can everyday 5) Limit all fluids for the day to less than 2 liters

## 2018-09-12 NOTE — Addendum Note (Signed)
Encounter addended by: Kerry Dory, CMA on: 09/12/2018 11:30 AM  Actions taken: Clinical Note Signed

## 2018-09-12 NOTE — Progress Notes (Signed)
Heart Failure TeleHealth Note  Due to national recommendations of social distancing due to [place] 19, Audio/video telehealth visit is felt to be most appropriate for this patient at this time.  See MyChart message from today for patient consent regarding telehealth for [facility].  Date:  09/12/2018   ID:  Michele Owens, DOB 09-Dec-1960, MRN [id number]  Location: Home  Provider location: [place] [facility] Type of Visit: Established patient   PCP:  [name], MD  Cardiologist:  No primary care provider on file. Primary HF: Dr Aundra Dubin   Chief Complaint: Heart Failure   History of Present Illness: Michele Owens is a 58 y.o. female with a history of HTN, obesity, OSA, COPD, hyperlipidemia, diastolic CHF,and SVT.   She was admitted in 6/01 with diastolic CHF and COPD exacerbation. She was admitted in 1/17 with COPD exacerbation. She was set up for a Cardiolite in 2/17. This showed a small, moderate intensity partially reversible inferolateral defect. LHC/RHC in 3/17 showed no significant CAD, mildly elevated PCWP. PFTs in 3/17 showed severe COPD.   Admitted 10/15-10/19/17 with AECOPD and A/CdiastolicCHF. Diuresed with IV lasix. Completed course for CAP. Pt also noted have multinodular goiter on [place]. 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   Echo in 4/18 showed EF 45-50%, moderate dilated RV with moderately decreased systolic function.   She was admitted again in 4/18 with COPD exacerbation, acute on chronic diastolic CHF, and CAP.  She was admitted in 3/20 with acute on chronic diastolic CHF and treated with IV Lasix. Echo showed EF 60-65%, mild RV enlargement with normal function, respirophasic septal variation.   She  presents via Theme park manager for a telehealth visit today with HF Paramedicine. Overall feeling fine. She remains SOB with exertion but this is her baseline. Denies PND/Orthopnea. She continues  on oxygen. Appetite ok. No fever or chills. Weight at  home 307 pounds. Taking all medications.    she denies symptoms worrisome for COVID 19.   Past Medical History:  Diagnosis Date  . Anxiety   . Arthritis   . Asthma   . Chronic diastolic CHF (congestive heart failure) ([place])   . COPD (chronic obstructive pulmonary disease) (H[facility])    Uses Oxygen at night  . Depression   . Diabetes mellitus without complication ([place])   . Dyspnea    occ  . Family history of thyroid cancer   . GERD (gastroesophageal reflux disease)   . Gout   . Headache    migraines  . History of DVT of lower extremity   . History of nuclear stress test    Myoview 2/17:  Low risk stress nuclear study with a small, moderate intensity, partially reversible inferior lateral defect consistent with small prior infarct and minimal peri-infarct ischemia; EF 68 with normal wall motion  . Hypertension   . Pneumonia   . Thyroid cancer ([place]) 09/23/2016   Past Surgical History:  Procedure Laterality Date  . BUNIONECTOMY Bilateral   . CARDIAC CATHETERIZATION N/A 06/23/2015   Procedure: Right/Left Heart Cath and Coronary Angiography;  Surgeon: [name], MD;  Location: [facility];  Service: Cardiovascular;  Laterality: N/A;  . COLONOSCOPY WITH PROPOFOL N/A 12/15/2015   Procedure: COLONOSCOPY WITH PROPOFOL;  Surgeon: [name], MD;  Location: [facility];  Service: Endoscopy;  Laterality: N/A;  . THYROIDECTOMY  09/19/2016  . THYROIDECTOMY N/A 09/19/2016   Procedure: TOTAL THYROIDECTOMY;  Surgeon: [name], MD;  Location: [facility];  Service: General;  Laterality: N/A;  . TONSILLECTOMY    . TOTAL ABDOMINAL HYSTERECTOMY  07/14/10     Current Outpatient Medications  Medication Sig Dispense Refill  . albuterol (PROVENTIL HFA;VENTOLIN HFA) 108 (90 Base) MCG/ACT inhaler Inhale 1-2 puffs into the lungs every 6 (six) hours as needed for wheezing or shortness of breath. 1 Inhaler 0  . allopurinol (ZYLOPRIM) 100 MG tablet  Take 100 mg by mouth daily.    Michele Owens 62.5-25 MCG/INH AEPB Inhale 1 puff into the lungs daily.     . Blood Glucose Monitoring Suppl (ACCU-CHEK AVIVA PLUS) w/Device KIT USE AS DIRECTED TO CHECK BLOOD SUGAR  0  . butalbital-acetaminophen-caffeine (FIORICET, ESGIC) 50-325-40 MG tablet Take 1-2 tablets by mouth every 4 (four) hours as needed for migraine.    . calcitRIOL (ROCALTROL) 0.5 MCG capsule Take 1 capsule (0.5 mcg total) by mouth daily. 30 capsule 0  . carvedilol (COREG) 6.25 MG tablet Take 1 tablet (6.25 mg total) by mouth 2 (two) times daily with a meal. 60 tablet 0  . cetirizine (ZYRTEC) 10 MG tablet Take 10 mg by mouth daily.  2  . clonazePAM (KLONOPIN) 2 MG tablet Take 2 mg by mouth 3 (three) times daily. for anxiety  1  . diclofenac sodium (VOLTAREN) 1 % GEL Apply 2 g topically 4 (four) times daily as needed (for pain). 1 Tube 0  . EPINEPHrine 0.3 mg/0.3 mL IJ SOAJ injection Inject 0.3 mg into the muscle daily as needed (allergic reaction).    . FEROSUL 325 (65 Fe) MG tablet Take 325 mg by mouth daily.  3  . furosemide (LASIX) 20 MG tablet Take 2 tablets (40 mg total) by mouth 2 (two) times daily. Take 3m every morning and 446mevery evening (Patient taking differently: Take 40 mg by mouth 2 (two) times daily. Take 4080mvery morning and 73m40mery evening) 360 tablet 0  . glucose blood test strip Use as instructed to check blood sugar up to 4 times daily 100 each 0  . guaiFENesin (MUCINEX) 600 MG 12 hr tablet Take 2 tablets (1,200 mg total) by mouth 2 (two) times daily. 60 tablet 0  . HYDROcodone-acetaminophen (NORCO/VICODIN) 5-325 MG tablet Take 1 tablet by mouth every 6 (six) hours as needed for moderate pain.   0  . ipratropium-albuterol (DUONEB) 0.5-2.5 (3) MG/3ML SOLN Take 3 mLs by nebulization 4 (four) times daily.   3  . levothyroxine (SYNTHROID, LEVOTHROID) 112 MCG tablet Take 224 mcg by mouth daily.  5  . lidocaine (LIDODERM) 5 % Place 1 patch onto the skin daily.  Remove & Discard patch within 12 hours or as directed by MD 5 patch 0  . lisinopril (PRINIVIL,ZESTRIL) 5 MG tablet Take 1 tablet (5 mg total) by mouth daily. Please call for office visit [phone] 0  . loperamide (IMODIUM) 2 MG capsule Take 1 capsule (2 mg total) by mouth 4 (four) times daily as needed for diarrhea or loose stools. 12 capsule 0  . metFORMIN (GLUCOPHAGE) 500 MG tablet Take 1 tablet (500 mg total) by mouth 2 (two) times daily with a meal. 30 tablet 1  . methocarbamol (ROBAXIN) 500 MG tablet Take 1 tablet (500 mg total) by mouth every 8 (eight) hours as needed for muscle spasms. 20 tablet 0  . metoCLOPramide (REGLAN) 10 MG tablet Take 1 tablet (10 mg total) by mouth every 6 (six) hours as needed for nausea (or headache). 30 tablet 0  . montelukast (SINGULAIR) 10  MG tablet Take 1 tablet (10 mg total) by mouth at bedtime. 30 tablet 0  . nitroGLYCERIN (NITROSTAT) 0.4 MG SL tablet Place 1 tablet (0.4 mg total) under the tongue every 5 (five) minutes as needed for chest pain. 12 tablet 0  . nystatin cream (MYCOSTATIN) Apply 1 application topically 2 (two) times daily as needed for dry skin.   1  . omeprazole (PRILOSEC) 40 MG capsule Take 40 mg by mouth daily.  2  . OXYGEN Inhale 2 L into the lungs at bedtime.    . potassium chloride SA (K-DUR,KLOR-CON) 20 MEQ tablet Take 1 tablet (20 mEq total) by mouth 2 (two) times daily. 180 tablet 0  . pravastatin (PRAVACHOL) 40 MG tablet Take 40 mg by mouth every evening.   2  . QC STOOL SOFTENER PLS LAXATIVE 8.6-50 MG tablet Take 1 tablet by mouth at bedtime.  2  . spironolactone (ALDACTONE) 100 MG tablet Take 100 mg by mouth daily.  3  . tiotropium (SPIRIVA) 18 MCG inhalation capsule Place 18 mcg into inhaler and inhale daily.     No current facility-administered medications for this encounter.     Allergies:   Bee venom; Fioricet [butalbital-apap-caffeine]; Ibuprofen; Lamisil [terbinafine]; and Nsaids   Social History:  The patient   reports that she quit smoking about 2 years ago. Her smoking use included cigarettes. She has a 20.50 pack-year smoking history. She has never used smokeless tobacco. She reports previous alcohol use. She reports that she does not use drugs.   Family History:  The patient's family history includes Brain cancer in her paternal aunt; Cancer in her cousin, cousin, father, and maternal uncle; Diabetes in her mother; Thyroid cancer in her mother.   ROS:  Please see the history of present illness.   All other systems are personally reviewed and negative.  Today's Vitals   09/12/18 1108  Temp: 98.4 F (36.9 C)  Weight: (!) 139.3 kg (307 lb)   Body mass index is 51.09 kg/m. Exam: Video Health Call; Exam is visual. General:  Speaks in full sentences. No resp difficulty. Lungs: Normal respiratory effort with conversation. On 2 liters oxygen.   Abdomen: Obese, non-distended per patient report Extremities: Pt denies edema. Neuro: Alert & oriented x 3.   Recent Labs: 06/18/2018: TSH 2.149 07/04/2018: B Natriuretic Peptide 67.5 07/10/2018: ALT 19; Hemoglobin 12.1; Magnesium 2.0; Platelets 265 07/31/2018: BUN 13; Creatinine, Ser 0.60; Potassium 4.0; Sodium 139  Personally reviewed   Wt Readings from Last 3 Encounters:  09/12/18 (!) 139.3 kg (307 lb)  08/27/18 (!) 137.8 kg (303 lb 12.8 oz)  07/11/18 (!) 137.7 kg (303 lb 9.6 oz)      ASSESSMENT AND PLAN:  1.Chronic diastolic CHF: Echoin 7/67 withEF 60-65%, mildly dilated RV with normal systolic function, respiratory variation of the IV septum =>most likely due to severe pulmonary disease, less likely constrictive pericarditis (normal-appearing pericardium on CT chest in 3/20).  -NYHA III likely combination of CHF and COPD.  - Volume status stable.  Continue lasix 40 mg am, 20 mg pm.  - Cardiomems process started last visit.  2. COPD: Patient has history of severe COPD - Wears O2 qHS and PRN  COVID screen The patient does not have any  symptoms that suggest any further testing/ screening at this time.  Social distancing reinforced today.  Patient Risk: After full review of this patients clinical status, I feel that they are at moderate risk for cardiac decompensation at this time.  Relevant cardiac medications  were reviewed at length with the patient today. The patient does not have concerns regarding their medications at this time.   The following changes were made today:  None  Recommended follow-up:  3 months   Today, I have spent  15 minutes with the patient with telehealth technology discussing the above issues .    Jeanmarie Hubert, NP  09/12/2018 9:33 AM  [facility] [address] [facility] and [place] [address] [phone] (office) [phone] (fax)

## 2018-09-18 ENCOUNTER — Telehealth (HOSPITAL_COMMUNITY): Payer: Self-pay

## 2018-09-18 ENCOUNTER — Telehealth (HOSPITAL_COMMUNITY): Payer: Self-pay | Admitting: Licensed Clinical Social Worker

## 2018-09-18 NOTE — Telephone Encounter (Signed)
Mrs. Sanagustin called me upset because the RN at Dr. Leontine Locket office will not allow her to speak directly to Dr. Jonelle Sidle.  She stated that she needs a aide to help her around the house and nobody is trying to help her.  I passed this message to our SW Tammy Sours; she will contact Mrs. Hobart via telephone visit.

## 2018-09-18 NOTE — Telephone Encounter (Signed)
Returned call to patient.  Pt left vm talking about trouble with her stomach" and she couldn't get through to her doctors office. Attempted to call patient however not available. LM to return call

## 2018-09-18 NOTE — Telephone Encounter (Signed)
CSW informed by [employer] paramedic that pt is interested in getting aid services.  CSW discussed with pt who reports it is difficult to do things for herself as she is on home oxygen and uses a cane to get around.  Pt agreeable to CSW completing PCS services form to see if pt is eligible for services covered by Medicaid.  CSW completed form and sent to clinic to be reviewed and signed by physician  CSW will continue to follow and assist as needed  Jorge Ny, [facility] Desk#: [phone] Cell#: [phone]

## 2018-09-20 ENCOUNTER — Telehealth (HOSPITAL_COMMUNITY): Payer: Self-pay

## 2018-09-20 ENCOUNTER — Telehealth (HOSPITAL_COMMUNITY): Payer: Self-pay | Admitting: Licensed Clinical Social Worker

## 2018-09-20 NOTE — Telephone Encounter (Signed)
CSW received call from pt to check on status of PCS application. Confirmed that application has now been signed by physician and is being faxed into [facility] for review.  CSW will continue to follow and follow up on referral as needed  Jorge Ny, [facility] Desk#: [phone] Cell#: [phone]

## 2018-09-20 NOTE — Telephone Encounter (Signed)
Received a call from pt stating that she still has not been able to speak with Dr. Jonelle Sidle over the telephone. States that the office staff was being rude to her. States that she will call them again.

## 2018-09-25 ENCOUNTER — Telehealth (HOSPITAL_COMMUNITY): Payer: Self-pay | Admitting: Licensed Clinical Social Worker

## 2018-09-25 NOTE — Telephone Encounter (Signed)
CSW called [employer] to confirm they have received all necessary paperwork for [facility] services.  They confirmed they have everything they need and have already set up an appt for an assessment of needs.  CSW will continue to follow and assist as needed.  Jorge Ny, LCSW Clinical Social Worker [facility] Desk#: [phone] Cell#: [phone]

## 2018-09-25 NOTE — Telephone Encounter (Signed)
Entered in error

## 2018-09-28 IMAGING — CR DG CHEST 2V
2 series · 2 of 2 positions shown · non-contrast
Comparison: 08/09/2015 chest radiograph

CLINICAL DATA: 54 y/o F; 3 days of shortness of breath with history
of congestive heart failure and bilateral edema.

EXAM:
CHEST  2 VIEW

[chest lat]
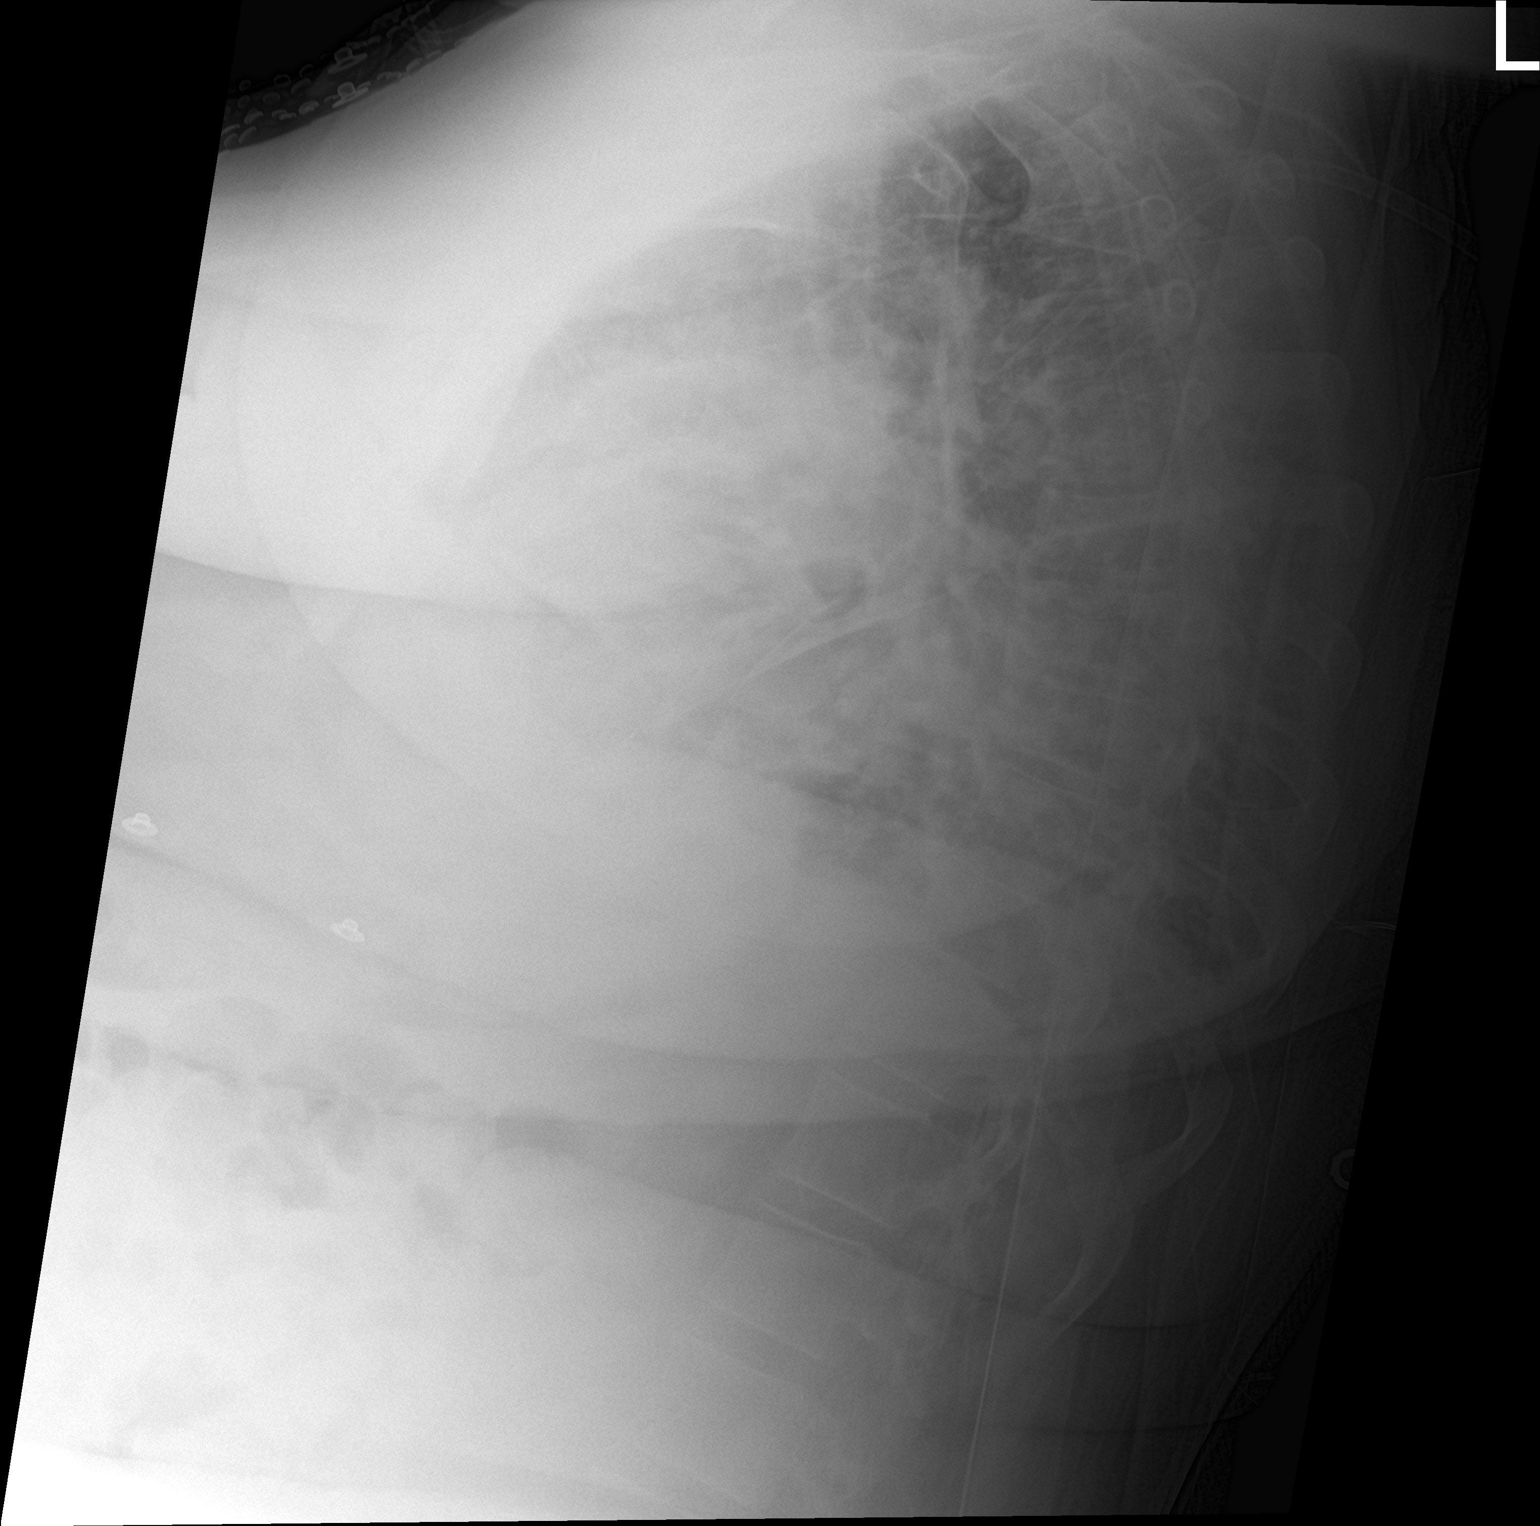

[chest ap]
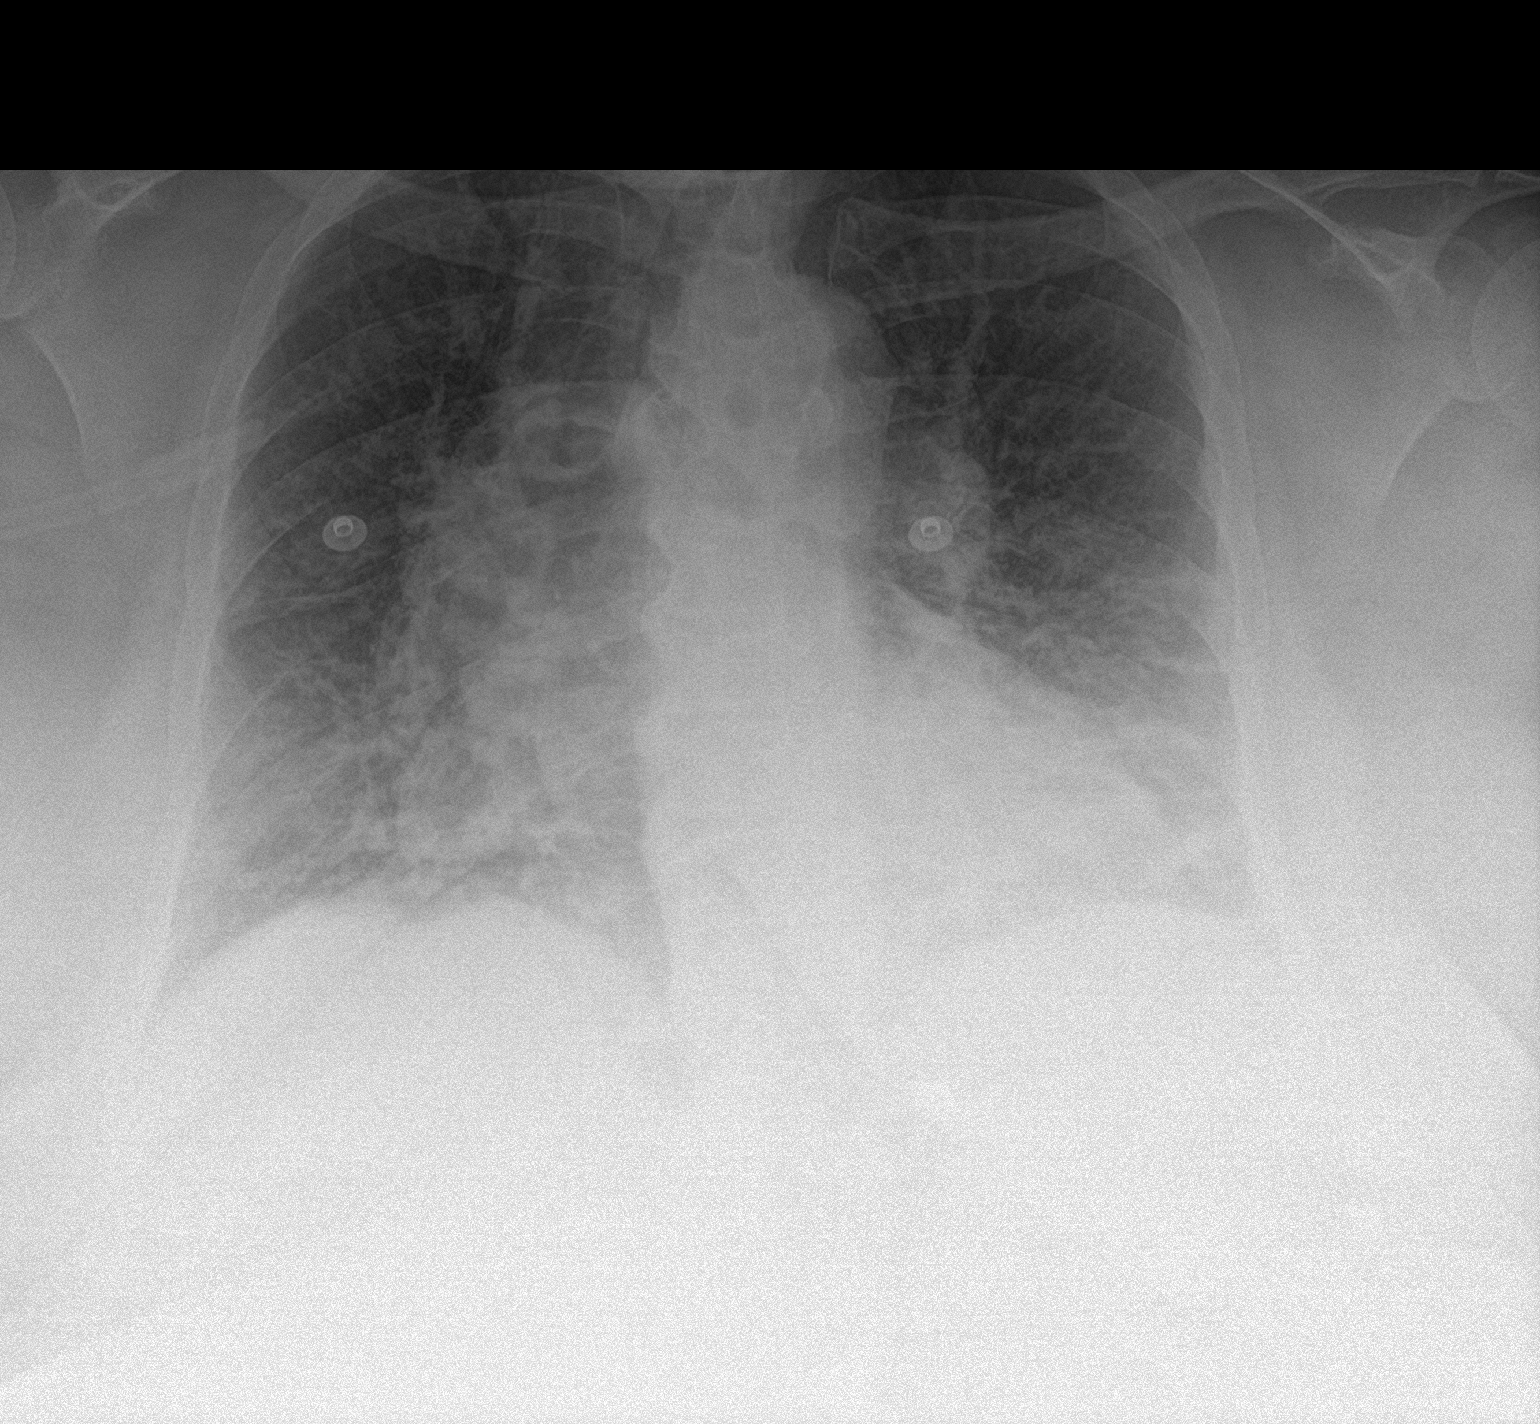

[2 of 2 positions shown; findings below may reference images not displayed]

FINDINGS: Bulky opacification of the right hilum. Linear opacities at the left
lung base. Diffusely prominent interstitial markings. Stable cardiac
silhouette. No acute osseous abnormality is evident.
IMPRESSION: Bulky opacification of right hilum may represent pneumonia or
atypical edema but underlying lymphadenopathy/mass is also possible.
CT of the chest is recommended for further characterization.

These results were called by telephone at the time of interpretation
on 01/23/2016 at [DATE] to Dr. PRAMOD HENNIG , who verbally
acknowledged these results.

By: Corrie Laskowski M.D.

## 2018-09-30 ENCOUNTER — Other Ambulatory Visit (HOSPITAL_COMMUNITY): Payer: Self-pay | Admitting: Cardiology

## 2018-09-30 IMAGING — CR DG CHEST 2V
2 series · 2 of 2 positions shown · non-contrast
Comparison: 01/24/2016 and priors to 04/24/2015

CLINICAL DATA: Dyspnea and leg swelling

EXAM:
CHEST  2 VIEW

[chest pa]
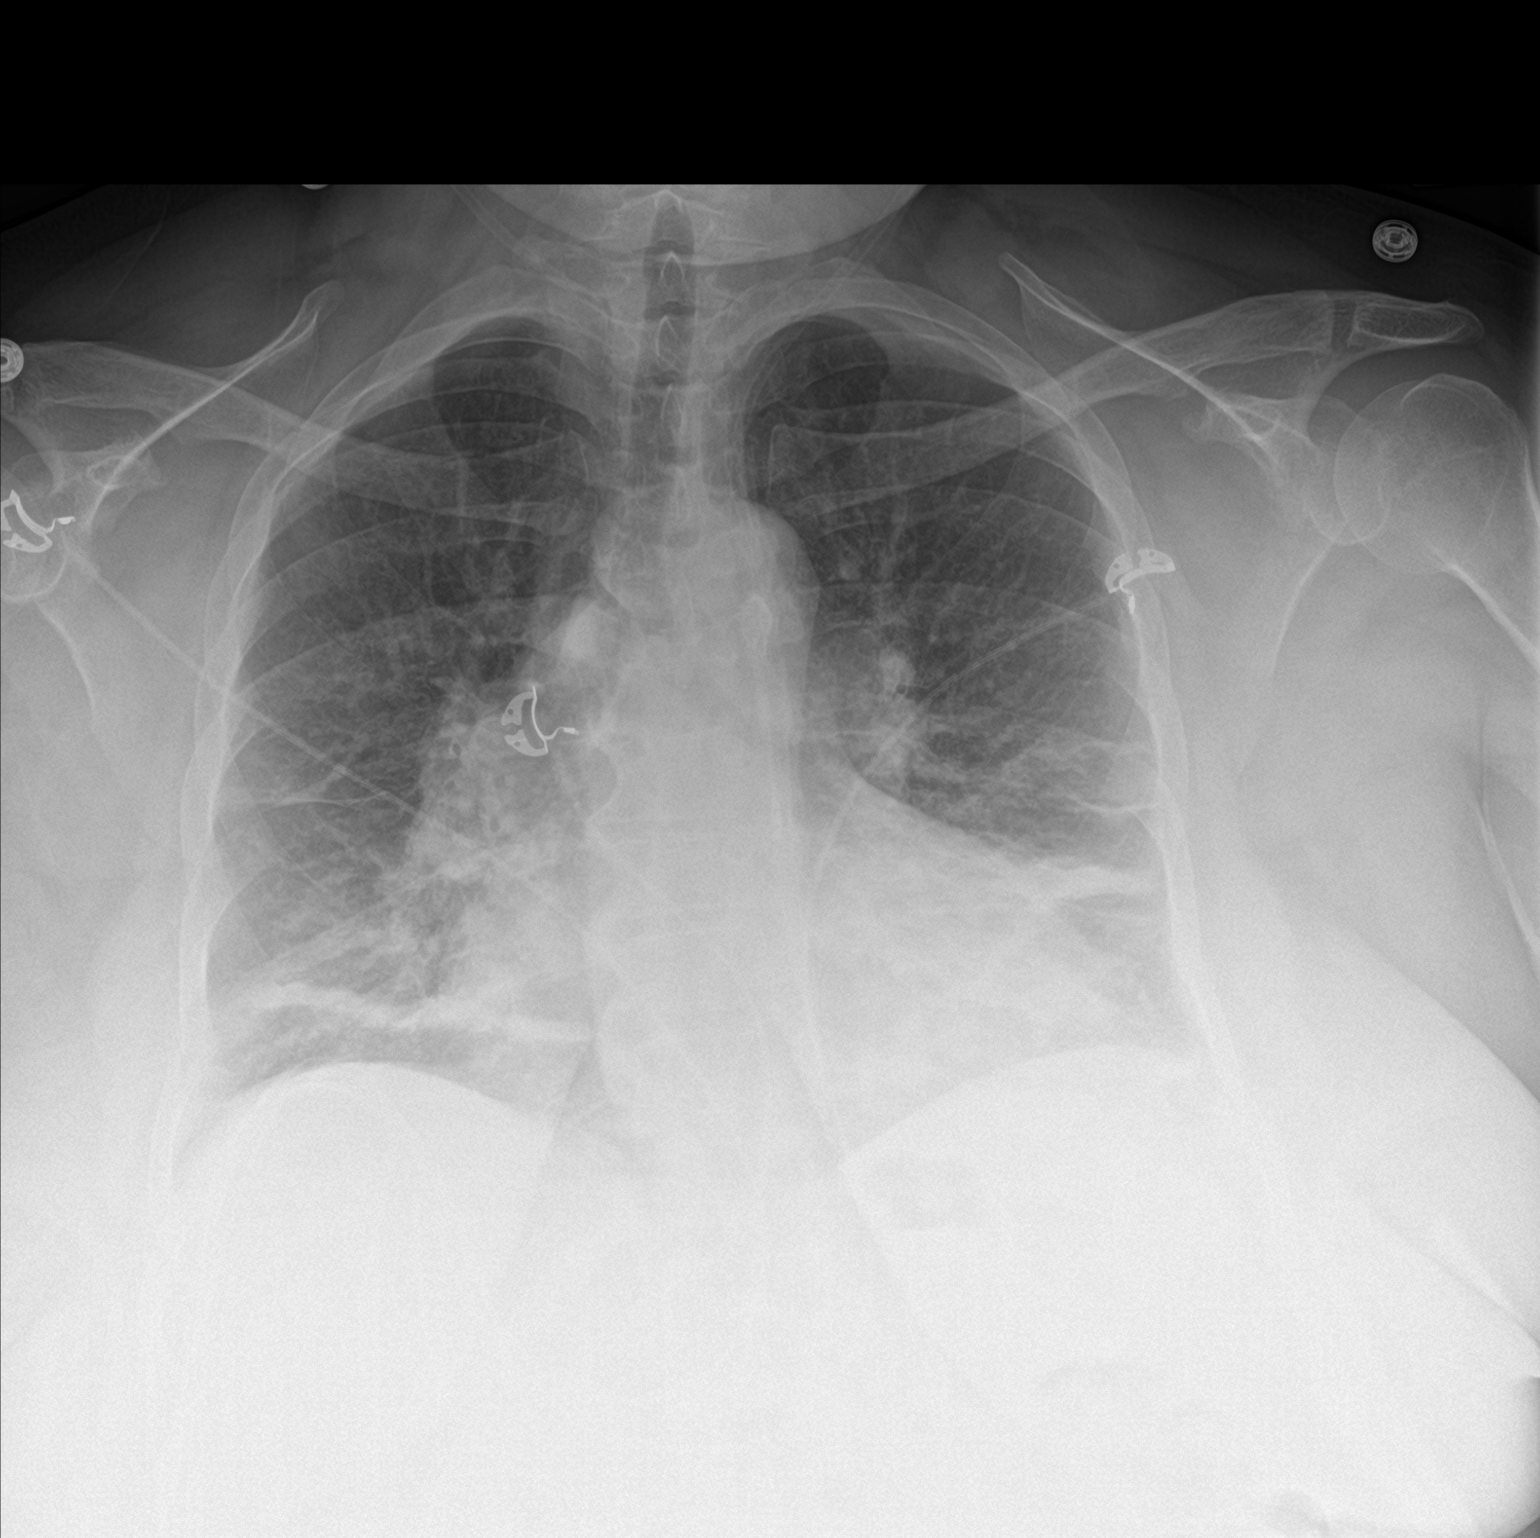

[chest lat]
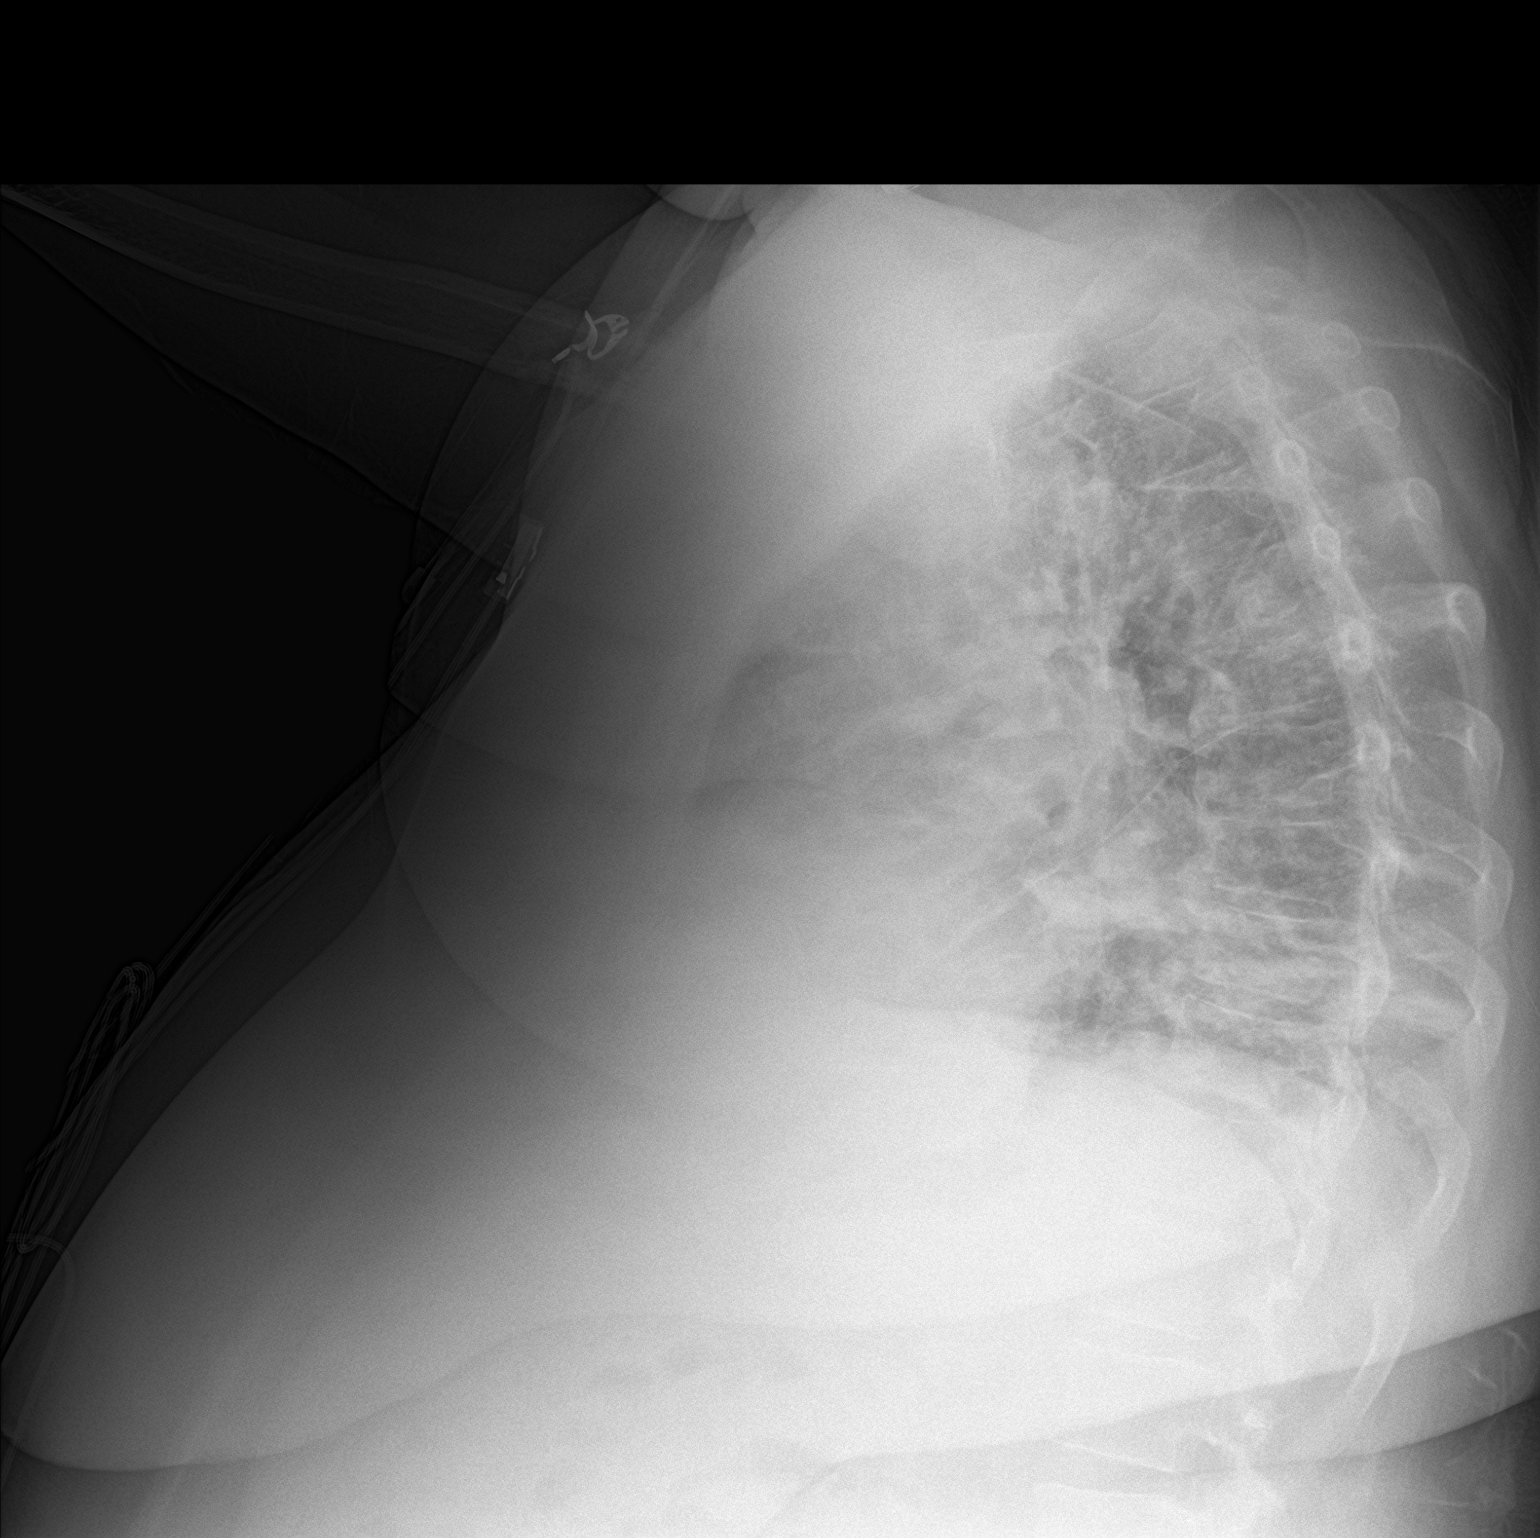

[2 of 2 positions shown; findings below may reference images not displayed]

FINDINGS: Stable cardiomegaly. Atherosclerosis of the visualized aorta.
Platelike atelectasis bilaterally. Mild vascular congestion. No
effusion or pneumothorax. No suspicious osseous lesions.
IMPRESSION: Stable cardiomegaly with bibasilar atelectasis. Mild vascular
congestion.

## 2018-10-01 ENCOUNTER — Telehealth (HOSPITAL_COMMUNITY): Payer: Self-pay

## 2018-10-01 NOTE — Telephone Encounter (Signed)
Michele Owens called yesterday and left messages for me to call back.  I called her today and she was very upset, she stated that Dr. Jonelle Sidle discharged her and people has been lying on her.  I asked her about me coming today for a CHP visit but she said no.  She said that she's hurting real bad and she thinks that she's about to have a panic attack.  I asked her what could I do to help her today.  She asked that I call [facility] to check on her prescription for clonazepam and hydrocodone.    The pharmacy is closed until 2pm.    I spoke with Dr. Leontine Locket nurse Cindie Laroche, whom said that pt was Dr. Jonelle Sidle discharged Michele Owens 09/11/2018. She's currently under his care until 10/11/2018 for acute care only.  Im unclear why she was discharged but Tammy Sours will follow up with his office about the same.

## 2018-10-02 ENCOUNTER — Telehealth (HOSPITAL_COMMUNITY): Payer: Self-pay

## 2018-10-02 NOTE — Telephone Encounter (Signed)
I called the pharmacy to check on the status of two medications called in from Dr. Leontine Locket office.  The pharmacist said that Michele Owens has already picked up one medication and the other will be ready after 4pm tomorrow.    Michele Owens is still very upset about being discharged from Dr. Leontine Locket office she stated that she doesn't want his office to refer her. She stated that she keep reading the letter that they sent and she cant stop crying.  I advised her that SW Tammy Sours could help her with the process.  Michele Owens refused "because Dr. Jonelle Sidle office is going to lie on her and she will find a doctor on her own".  She agrees to a [facility] visit for tomorrow.

## 2018-10-03 ENCOUNTER — Telehealth (HOSPITAL_COMMUNITY): Payer: Self-pay | Admitting: Licensed Clinical Social Worker

## 2018-10-03 ENCOUNTER — Telehealth (HOSPITAL_COMMUNITY): Payer: Self-pay

## 2018-10-03 NOTE — Telephone Encounter (Signed)
I called mrs. Michele Owens back today and she rescheduled our [facility] visit for tomorrow morning. She stated that she couldn't sleep last night because she was still very upset about losing Dr. Jonelle Owens as her doctor.  Mrs. Michele Owens said that she has found another pcp in [facility]; she switched pharmacies also.  She wouldn't give me information on either.

## 2018-10-03 NOTE — Telephone Encounter (Signed)
CSW informed by community paramedic that she is being discharged from her PCP office and she is very upset by this.  CSW called pt PCP office and spoke with RN who states that patient is being discharged because she expressed that she was unsatisfied with the care being provided, that she was calling too frequently, and that she was being noncompliant with medications.  CSW discussed further with RN and advocated on behalf of the patient as she has been a patient with this office for a long time and is very upset at being discharged from their care.  RN to discuss further with MD and get back to CSW regarding decision.  CSW will continue to follow and assist as needed  Jorge Ny, [facility] Desk#: [phone] Cell#: [phone]

## 2018-10-04 ENCOUNTER — Other Ambulatory Visit (HOSPITAL_COMMUNITY): Payer: Self-pay

## 2018-10-04 NOTE — Progress Notes (Signed)
Paramedicine Encounter    Patient ID: Michele Owens, female    DOB: 1960-12-08, 58 y.o.   MRN: [id number]    Patient Care Team: [name], MD as PCP - General (Internal Medicine) Sharmon Revere as Referring Physician (Physician Assistant)  Patient Active Problem List   Diagnosis Date Noted  . Morbid obesity with BMI of 50.0-59.9, adult ([place])   . CHF exacerbation ([place]) 07/04/2018  . Acute heart failure ([place]) 07/04/2018  . Fall at home, initial encounter 06/23/2018  . HNP (herniated nucleus pulposus), thoracic 06/23/2018  . Sprain of shoulder, left 06/18/2018  . Strain of thoracic back region 06/18/2018  . Strain of lumbar region 06/18/2018  . CHF (congestive heart failure) ([place]) 06/18/2018  . Pain 06/17/2018  . Hypertensive disorder 08/28/2017  . Obesity 08/28/2017  . COPD exacerbation ([place]) 08/04/2017  . Acute and chronic respiratory failure with hypercapnia ([place])   . Acute on chronic diastolic CHF (congestive heart failure) ([place])   . Pulmonary HTN ([place])   . Genetic testing 02/27/2017  . Family history of thyroid cancer   . Hypomagnesemia 10/03/2016  . Boil, breast 10/03/2016  . Atypical chest pain   . HLD (hyperlipidemia) 09/23/2016  . COPD (chronic obstructive pulmonary disease) ([place]) 09/23/2016  . Gout 09/23/2016  . DVT (deep vein thrombosis) in pregnancy 09/23/2016  . Thyroid cancer ([place]) 09/23/2016  . Hypocalcemia 09/23/2016  . AKI (acute kidney injury) ([place]) 09/23/2016  . 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 Acute pulmonary edema ([facility])   . Acute hypoxemic respiratory failure ([place])   . Acute on chronic respiratory failure with hypoxemia ([place])   . Ventilator dependent ([place])   . Respiratory failure ([place]) 07/16/2016  . Chronic combined systolic (congestive) and diastolic (congestive) heart failure ([place]) 02/10/2016  . Dyspnea   . CAP (community acquired pneumonia) 01/23/2016  . 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 Pulmonary hypertension ([place]) 01/23/2016  . Sepsis ([place]) 01/23/2016  . Obesity hypoventilation syndrome ([place]) 08/26/2015  . Chest pain 06/16/2015  . Dyslipidemia associated with type 2 diabetes mellitus ([place]) 04/24/2015  . Leukocytosis 04/24/2015  . Controlled type 2 diabetes mellitus without complication, without long-term current use of insulin ([place]) 04/24/2015  . Anxiety 04/24/2015  . Benign essential HTN 04/24/2015  . Normocytic anemia 04/24/2015  . Asthma with COPD with exacerbation ([place]) 04/24/2015  . OSA (obstructive sleep apnea) 05/10/2012  . Tobacco abuse 07/04/2011    Current Outpatient Medications:  .  albuterol (PROVENTIL HFA;VENTOLIN HFA) 108 (90 Base) MCG/ACT inhaler, Inhale 1-2 puffs into the lungs every 6 (six) hours as needed for wheezing or shortness of breath., Disp: 1 Inhaler, Rfl: 0 .  allopurinol (ZYLOPRIM) 100 MG tablet, Take 100 mg by mouth daily., Disp: , Rfl:  .  ANORO ELLIPTA 62.5-25 MCG/INH AEPB, Inhale 1 puff into the lungs daily. , Disp: , Rfl:  .  Blood Glucose Monitoring Suppl (ACCU-CHEK AVIVA PLUS) w/Device KIT, USE AS DIRECTED TO CHECK BLOOD SUGAR, Disp: , Rfl: 0 .  butalbital-acetaminophen-caffeine (FIORICET, ESGIC) 50-325-40 MG tablet, Take 1-2 tablets by mouth every 4 (four) hours as needed for migraine., Disp: , Rfl:  .  calcitRIOL (ROCALTROL) 0.5 MCG capsule, Take 1 capsule (0.5 mcg total) by mouth daily., Disp: 30 capsule, Rfl: 0 .  carvedilol (COREG) 6.25 MG tablet, Take 1 tablet (6.25 mg total) by mouth 2 (two) times daily with a meal., Disp: 60 tablet, Rfl: 0 .  cetirizine (ZYRTEC) 10 MG tablet,  Take 10 mg by mouth daily., Disp: , Rfl: 2 .  clonazePAM (KLONOPIN) 2 MG tablet, Take 2 mg by mouth 3 (three) times daily. for anxiety, Disp: , Rfl: 1 .  diclofenac sodium (VOLTAREN) 1 % GEL, Apply 2 g topically 4 (four) times daily as needed (for pain)., Disp: 1 Tube, Rfl: 0 .  EPINEPHrine 0.3 mg/0.3 mL IJ SOAJ injection, Inject 0.3 mg  into the muscle daily as needed (allergic reaction)., Disp: , Rfl:  .  FEROSUL 325 (65 Fe) MG tablet, Take 325 mg by mouth daily., Disp: , Rfl: 3 .  furosemide (LASIX) 20 MG tablet, Take 2 tablets (40 mg total) by mouth 2 (two) times daily. Take 21m every morning and 485mevery evening (Patient taking differently: Take 40 mg by mouth 2 (two) times daily. Take 4029mvery morning and 29m63mery evening), Disp: 360 tablet, Rfl: 0 .  glucose blood test strip, Use as instructed to check blood sugar up to 4 times daily, Disp: 100 each, Rfl: 0 .  guaiFENesin (MUCINEX) 600 MG 12 hr tablet, Take 2 tablets (1,200 mg total) by mouth 2 (two) times daily., Disp: 60 tablet, Rfl: 0 .  HYDROcodone-acetaminophen (NORCO/VICODIN) 5-325 MG tablet, Take 1 tablet by mouth every 6 (six) hours as needed for moderate pain. , Disp: , Rfl: 0 .  ipratropium-albuterol (DUONEB) 0.5-2.5 (3) MG/3ML SOLN, Take 3 mLs by nebulization 4 (four) times daily. , Disp: , Rfl: 3 .  levothyroxine (SYNTHROID, LEVOTHROID) 112 MCG tablet, Take 224 mcg by mouth daily., Disp: , Rfl: 5 .  lidocaine (LIDODERM) 5 %, Place 1 patch onto the skin daily. Remove & Discard patch within 12 hours or as directed by MD, Disp: 5 patch, Rfl: 0 .  lisinopril (PRINIVIL,ZESTRIL) 5 MG tablet, Take 1 tablet (5 mg total) by mouth daily. Please call for office visit [phone]: 30 tablet, Rfl: 0 .  loperamide (IMODIUM) 2 MG capsule, Take 1 capsule (2 mg total) by mouth 4 (four) times daily as needed for diarrhea or loose stools., Disp: 12 capsule, Rfl: 0 .  metFORMIN (GLUCOPHAGE) 500 MG tablet, Take 1 tablet (500 mg total) by mouth 2 (two) times daily with a meal., Disp: 30 tablet, Rfl: 1 .  methocarbamol (ROBAXIN) 500 MG tablet, Take 1 tablet (500 mg total) by mouth every 8 (eight) hours as needed for muscle spasms., Disp: 20 tablet, Rfl: 0 .  metoCLOPramide (REGLAN) 10 MG tablet, Take 1 tablet (10 mg total) by mouth every 6 (six) hours as needed for nausea (or  headache)., Disp: 30 tablet, Rfl: 0 .  montelukast (SINGULAIR) 10 MG tablet, Take 1 tablet (10 mg total) by mouth at bedtime., Disp: 30 tablet, Rfl: 0 .  nitroGLYCERIN (NITROSTAT) 0.4 MG SL tablet, Place 1 tablet (0.4 mg total) under the tongue every 5 (five) minutes as needed for chest pain., Disp: 12 tablet, Rfl: 0 .  nystatin cream (MYCOSTATIN), Apply 1 application topically 2 (two) times daily as needed for dry skin. , Disp: , Rfl: 1 .  omeprazole (PRILOSEC) 40 MG capsule, Take 40 mg by mouth daily., Disp: , Rfl: 2 .  OXYGEN, Inhale 2 L into the lungs at bedtime., Disp: , Rfl:  .  potassium chloride SA (K-DUR,KLOR-CON) 20 MEQ tablet, Take 1 tablet (20 mEq total) by mouth 2 (two) times daily., Disp: 180 tablet, Rfl: 0 .  pravastatin (PRAVACHOL) 40 MG tablet, Take 40 mg by mouth every evening. , Disp: , Rfl: 2 .  QC STOOL SOFTENER  PLS LAXATIVE 8.6-50 MG tablet, Take 1 tablet by mouth at bedtime., Disp: , Rfl: 2 .  tiotropium (SPIRIVA) 18 MCG inhalation capsule, Place 18 mcg into inhaler and inhale daily., Disp: , Rfl:  Allergies  Allergen Reactions  . Bee Venom Swelling and Other (See Comments)    "All over my body" (swelling)  . Fioricet [Butalbital-Apap-Caffeine] Nausea And Vomiting and Rash  . Ibuprofen Rash and Other (See Comments)    Severe rash  . Lamisil [Terbinafine] Rash and Other (See Comments)    Pt states this causes her to "feel funny"  . Nsaids Other (See Comments)    Per MD's orders      Social History   Socioeconomic History  . Marital status: Single    Spouse name: Not on file  . Number of children: Not on file  . Years of education: Not on file  . Highest education level: Not on file  Occupational History  . Occupation: unemployeed  Social Needs  . Financial resource strain: Not on file  . Food insecurity    Worry: Not on file    Inability: Not on file  . Transportation needs    Medical: Not on file    Non-medical: Not on file  Tobacco Use  . Smoking  status: Former Smoker    Packs/day: 0.50    Years: 41.00    Pack years: 20.50    Types: Cigarettes    Quit date: 07/2016    Years since quitting: 2.2  . Smokeless tobacco: Never Used  . Tobacco comment: Starting to Wean Off -- using Nicotine Patch  Substance and Sexual Activity  . Alcohol use: Not Currently    Alcohol/week: 0.0 standard drinks    Comment: ? none now  . Drug use: Never    Comment: none for long time  . Sexual activity: Yes  Lifestyle  . Physical activity    Days per week: Not on file    Minutes per session: Not on file  . Stress: Not on file  Relationships  . Social Herbalist on phone: Not on file    Gets together: Not on file    Attends religious service: Not on file    Active member of club or organization: Not on file    Attends meetings of clubs or organizations: Not on file    Relationship status: Not on file  . Intimate partner violence    Fear of current or ex partner: Not on file    Emotionally abused: Not on file    Physically abused: Not on file    Forced sexual activity: Not on file  Other Topics Concern  . Not on file  Social History Narrative  . Not on file    Physical Exam Pulmonary:     Effort: Pulmonary effort is normal. No respiratory distress.     Breath sounds: No wheezing or rales.  Abdominal:     Palpations: Abdomen is soft.     Tenderness: There is no abdominal tenderness. There is no guarding.  Musculoskeletal:     Right lower leg: No edema.     Left lower leg: No edema.  Skin:    General: Skin is warm and dry.         Future Appointments  Date Time Provider [place]  11/06/2018 10:00 AM Marzetta Board, DPM [facility]     BP 110/80 (BP Location: Right Arm, Patient Position: Sitting, Cuff Size: Large)   Pulse  92   Temp 98.4 F (36.9 C)   Wt (!) 301 lb 6.4 oz (136.7 kg)   SpO2 96%   BMI 50.16 kg/m   Weight yesterday-didn't weigh Last visit weight-307  ATF pt CAO x4  sitting in her living room watching tv.  She answered the door fully dressed and smiling. She appears to be in good spirits and is engaging in conversation.  She stated that she's taken all of her medications without difficulty.  She said that she's trying to eat better and is going out more.  She asked if I worked with Dr. Leontine Locket office, I reminded her that I work for Dr. Claris Gladden office.  Michele Owens said that she hasn't weighed since our last home visit.  rx bottles verified.     Medication ordered: none  Michele Owens, EMT Paramedic [phone] 10/04/2018    ACTION: Home visit completed

## 2018-10-15 ENCOUNTER — Encounter (HOSPITAL_COMMUNITY): Payer: Self-pay | Admitting: *Deleted

## 2018-10-15 ENCOUNTER — Other Ambulatory Visit (HOSPITAL_COMMUNITY): Payer: Self-pay

## 2018-10-15 ENCOUNTER — Telehealth (HOSPITAL_COMMUNITY): Payer: Self-pay

## 2018-10-15 NOTE — Progress Notes (Signed)
Paramedicine Encounter    Patient ID: Michele Owens, female    DOB: 1960-12-08, 58 y.o.   MRN: [id number]    Patient Care Team: [name], MD as PCP - General (Internal Medicine) Sharmon Revere as Referring Physician (Physician Assistant)  Patient Active Problem List   Diagnosis Date Noted  . Morbid obesity with BMI of 50.0-59.9, adult ([place])   . CHF exacerbation ([place]) 07/04/2018  . Acute heart failure ([place]) 07/04/2018  . Fall at home, initial encounter 06/23/2018  . HNP (herniated nucleus pulposus), thoracic 06/23/2018  . Sprain of shoulder, left 06/18/2018  . Strain of thoracic back region 06/18/2018  . Strain of lumbar region 06/18/2018  . CHF (congestive heart failure) ([place]) 06/18/2018  . Pain 06/17/2018  . Hypertensive disorder 08/28/2017  . Obesity 08/28/2017  . COPD exacerbation ([place]) 08/04/2017  . Acute and chronic respiratory failure with hypercapnia ([place])   . Acute on chronic diastolic CHF (congestive heart failure) ([place])   . Pulmonary HTN ([place])   . Genetic testing 02/27/2017  . Family history of thyroid cancer   . Hypomagnesemia 10/03/2016  . Boil, breast 10/03/2016  . Atypical chest pain   . HLD (hyperlipidemia) 09/23/2016  . COPD (chronic obstructive pulmonary disease) ([place]) 09/23/2016  . Gout 09/23/2016  . DVT (deep vein thrombosis) in pregnancy 09/23/2016  . Thyroid cancer ([place]) 09/23/2016  . Hypocalcemia 09/23/2016  . AKI (acute kidney injury) ([place]) 09/23/2016  . 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 Acute pulmonary edema ([facility])   . Acute hypoxemic respiratory failure ([place])   . Acute on chronic respiratory failure with hypoxemia ([place])   . Ventilator dependent ([place])   . Respiratory failure ([place]) 07/16/2016  . Chronic combined systolic (congestive) and diastolic (congestive) heart failure ([place]) 02/10/2016  . Dyspnea   . CAP (community acquired pneumonia) 01/23/2016  . 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 Pulmonary hypertension ([place]) 01/23/2016  . Sepsis ([place]) 01/23/2016  . Obesity hypoventilation syndrome ([place]) 08/26/2015  . Chest pain 06/16/2015  . Dyslipidemia associated with type 2 diabetes mellitus ([place]) 04/24/2015  . Leukocytosis 04/24/2015  . Controlled type 2 diabetes mellitus without complication, without long-term current use of insulin ([place]) 04/24/2015  . Anxiety 04/24/2015  . Benign essential HTN 04/24/2015  . Normocytic anemia 04/24/2015  . Asthma with COPD with exacerbation ([place]) 04/24/2015  . OSA (obstructive sleep apnea) 05/10/2012  . Tobacco abuse 07/04/2011    Current Outpatient Medications:  .  albuterol (PROVENTIL HFA;VENTOLIN HFA) 108 (90 Base) MCG/ACT inhaler, Inhale 1-2 puffs into the lungs every 6 (six) hours as needed for wheezing or shortness of breath., Disp: 1 Inhaler, Rfl: 0 .  allopurinol (ZYLOPRIM) 100 MG tablet, Take 100 mg by mouth daily., Disp: , Rfl:  .  ANORO ELLIPTA 62.5-25 MCG/INH AEPB, Inhale 1 puff into the lungs daily. , Disp: , Rfl:  .  Blood Glucose Monitoring Suppl (ACCU-CHEK AVIVA PLUS) w/Device KIT, USE AS DIRECTED TO CHECK BLOOD SUGAR, Disp: , Rfl: 0 .  butalbital-acetaminophen-caffeine (FIORICET, ESGIC) 50-325-40 MG tablet, Take 1-2 tablets by mouth every 4 (four) hours as needed for migraine., Disp: , Rfl:  .  calcitRIOL (ROCALTROL) 0.5 MCG capsule, Take 1 capsule (0.5 mcg total) by mouth daily., Disp: 30 capsule, Rfl: 0 .  carvedilol (COREG) 6.25 MG tablet, Take 1 tablet (6.25 mg total) by mouth 2 (two) times daily with a meal., Disp: 60 tablet, Rfl: 0 .  cetirizine (ZYRTEC) 10 MG tablet,  Take 10 mg by mouth daily., Disp: , Rfl: 2 .  clonazePAM (KLONOPIN) 2 MG tablet, Take 2 mg by mouth 3 (three) times daily. for anxiety, Disp: , Rfl: 1 .  diclofenac sodium (VOLTAREN) 1 % GEL, Apply 2 g topically 4 (four) times daily as needed (for pain)., Disp: 1 Tube, Rfl: 0 .  EPINEPHrine 0.3 mg/0.3 mL IJ SOAJ injection, Inject 0.3 mg  into the muscle daily as needed (allergic reaction)., Disp: , Rfl:  .  FEROSUL 325 (65 Fe) MG tablet, Take 325 mg by mouth daily., Disp: , Rfl: 3 .  furosemide (LASIX) 20 MG tablet, Take 2 tablets (40 mg total) by mouth 2 (two) times daily. Take 26m every morning and 429mevery evening (Patient taking differently: Take 40 mg by mouth 2 (two) times daily. Take 4080mvery morning and 29m56mery evening), Disp: 360 tablet, Rfl: 0 .  glucose blood test strip, Use as instructed to check blood sugar up to 4 times daily, Disp: 100 each, Rfl: 0 .  guaiFENesin (MUCINEX) 600 MG 12 hr tablet, Take 2 tablets (1,200 mg total) by mouth 2 (two) times daily., Disp: 60 tablet, Rfl: 0 .  HYDROcodone-acetaminophen (NORCO/VICODIN) 5-325 MG tablet, Take 1 tablet by mouth every 6 (six) hours as needed for moderate pain. , Disp: , Rfl: 0 .  ipratropium-albuterol (DUONEB) 0.5-2.5 (3) MG/3ML SOLN, Take 3 mLs by nebulization 4 (four) times daily. , Disp: , Rfl: 3 .  levothyroxine (SYNTHROID, LEVOTHROID) 112 MCG tablet, Take 224 mcg by mouth daily., Disp: , Rfl: 5 .  lidocaine (LIDODERM) 5 %, Place 1 patch onto the skin daily. Remove & Discard patch within 12 hours or as directed by MD, Disp: 5 patch, Rfl: 0 .  lisinopril (PRINIVIL,ZESTRIL) 5 MG tablet, Take 1 tablet (5 mg total) by mouth daily. Please call for office visit [phone]: 30 tablet, Rfl: 0 .  loperamide (IMODIUM) 2 MG capsule, Take 1 capsule (2 mg total) by mouth 4 (four) times daily as needed for diarrhea or loose stools., Disp: 12 capsule, Rfl: 0 .  metFORMIN (GLUCOPHAGE) 500 MG tablet, Take 1 tablet (500 mg total) by mouth 2 (two) times daily with a meal., Disp: 30 tablet, Rfl: 1 .  methocarbamol (ROBAXIN) 500 MG tablet, Take 1 tablet (500 mg total) by mouth every 8 (eight) hours as needed for muscle spasms., Disp: 20 tablet, Rfl: 0 .  metoCLOPramide (REGLAN) 10 MG tablet, Take 1 tablet (10 mg total) by mouth every 6 (six) hours as needed for nausea (or  headache)., Disp: 30 tablet, Rfl: 0 .  montelukast (SINGULAIR) 10 MG tablet, Take 1 tablet (10 mg total) by mouth at bedtime., Disp: 30 tablet, Rfl: 0 .  nitroGLYCERIN (NITROSTAT) 0.4 MG SL tablet, Place 1 tablet (0.4 mg total) under the tongue every 5 (five) minutes as needed for chest pain., Disp: 12 tablet, Rfl: 0 .  nystatin cream (MYCOSTATIN), Apply 1 application topically 2 (two) times daily as needed for dry skin. , Disp: , Rfl: 1 .  omeprazole (PRILOSEC) 40 MG capsule, Take 40 mg by mouth daily., Disp: , Rfl: 2 .  OXYGEN, Inhale 2 L into the lungs at bedtime., Disp: , Rfl:  .  potassium chloride SA (K-DUR,KLOR-CON) 20 MEQ tablet, Take 1 tablet (20 mEq total) by mouth 2 (two) times daily., Disp: 180 tablet, Rfl: 0 .  pravastatin (PRAVACHOL) 40 MG tablet, Take 40 mg by mouth every evening. , Disp: , Rfl: 2 .  QC STOOL SOFTENER  PLS LAXATIVE 8.6-50 MG tablet, Take 1 tablet by mouth at bedtime., Disp: , Rfl: 2 .  tiotropium (SPIRIVA) 18 MCG inhalation capsule, Place 18 mcg into inhaler and inhale daily., Disp: , Rfl:  Allergies  Allergen Reactions  . Bee Venom Swelling and Other (See Comments)    "All over my body" (swelling)  . Fioricet [Butalbital-Apap-Caffeine] Nausea And Vomiting and Rash  . Ibuprofen Rash and Other (See Comments)    Severe rash  . Lamisil [Terbinafine] Rash and Other (See Comments)    Pt states this causes her to "feel funny"  . Nsaids Other (See Comments)    Per MD's orders      Social History   Socioeconomic History  . Marital status: Single    Spouse name: Not on file  . Number of children: Not on file  . Years of education: Not on file  . Highest education level: Not on file  Occupational History  . Occupation: unemployeed  Social Needs  . Financial resource strain: Not on file  . Food insecurity    Worry: Not on file    Inability: Not on file  . Transportation needs    Medical: Not on file    Non-medical: Not on file  Tobacco Use  . Smoking  status: Former Smoker    Packs/day: 0.50    Years: 41.00    Pack years: 20.50    Types: Cigarettes    Quit date: 07/2016    Years since quitting: 2.2  . Smokeless tobacco: Never Used  . Tobacco comment: Starting to Wean Off -- using Nicotine Patch  Substance and Sexual Activity  . Alcohol use: Not Currently    Alcohol/week: 0.0 standard drinks    Comment: ? none now  . Drug use: Never    Comment: none for long time  . Sexual activity: Yes  Lifestyle  . Physical activity    Days per week: Not on file    Minutes per session: Not on file  . Stress: Not on file  Relationships  . Social Herbalist on phone: Not on file    Gets together: Not on file    Attends religious service: Not on file    Active member of club or organization: Not on file    Attends meetings of clubs or organizations: Not on file    Relationship status: Not on file  . Intimate partner violence    Fear of current or ex partner: Not on file    Emotionally abused: Not on file    Physically abused: Not on file    Forced sexual activity: Not on file  Other Topics Concern  . Not on file  Social History Narrative  . Not on file    Physical Exam Pulmonary:     Effort: No respiratory distress.     Breath sounds: No wheezing or rales.  Abdominal:     General: There is no distension.  Musculoskeletal:        General: No swelling.     Right lower leg: No edema.     Left lower leg: No edema.  Skin:    General: Skin is warm and dry.         Future Appointments  Date Time Provider [place]  11/06/2018 10:00 AM Marzetta Board, DPM [facility]     BP 118/68 (BP Location: Left Arm, Patient Position: Sitting, Cuff Size: Large)   Pulse (!) 108   Temp (!) 97.4  F (36.3 C)   SpO2 94%    ATF pt CAO x4 sitting in the living room talking with her aide; today is the aide's first day. Mrs. [place] said that she didn't get much sleep last night, because she was thinking about  her cousin.  Her nebulizer isn't working, I showed her how to connect the tubing from the neb to her home 02.  I called the company that supplied the machine, the operator said that someone will call her later.  She doesn't have a scale to weigh, the batteries are dead. I told her that ill bring more by later this week. She denies sob, chest pain and dizziness. rx bottles verified.    Medication ordered: None   , EMT Paramedic [phone] 10/15/2018    ACTION: Home visit completed

## 2018-10-15 NOTE — Progress Notes (Signed)
Info form and records for cardiomems faxed in to [phone]

## 2018-10-15 NOTE — Telephone Encounter (Signed)
I callled Mrs. Qazi to schedule a CHP visit for this week.  She didn't answer so I left a message for her to return my call.

## 2018-10-25 ENCOUNTER — Telehealth (HOSPITAL_COMMUNITY): Payer: Self-pay

## 2018-10-25 NOTE — Progress Notes (Signed)
Per [employer], they can not verify coverage with Medicaid and I will need to do this myself.  Logged into [employer] and looked up CPT code [phone] (the implant code) and no info was found meaning it is not covered by them and is not even in their system.  Message sent to Dr Aundra Dubin to let him know.

## 2018-10-25 NOTE — Telephone Encounter (Signed)
I called Michele Owens to schedule a CHP visit earlier today but didn't get an answer.   I called back and left a message, telling her that I will be on vacation next week.  I told her to call Dr. Claris Gladden office if she need someone to check on her while I'm out of he office.

## 2018-10-30 ENCOUNTER — Emergency Department (HOSPITAL_COMMUNITY): Payer: Medicaid Other

## 2018-10-30 ENCOUNTER — Encounter (HOSPITAL_COMMUNITY): Payer: Self-pay | Admitting: Emergency Medicine

## 2018-10-30 ENCOUNTER — Ambulatory Visit (HOSPITAL_COMMUNITY)
Admission: EM | Admit: 2018-10-30 | Discharge: 2018-10-30 | Disposition: A | Discharge status: Home / Self Care | Payer: Medicaid Other

## 2018-10-30 ENCOUNTER — Other Ambulatory Visit: Payer: Self-pay

## 2018-10-30 ENCOUNTER — Emergency Department (HOSPITAL_COMMUNITY)
Admission: EM | Admit: 2018-10-30 | Discharge: 2018-10-30 | Disposition: A | Discharge status: Home / Self Care | Payer: Medicaid Other | Attending: Emergency Medicine | Admitting: Emergency Medicine

## 2018-10-30 DIAGNOSIS — G44319 Acute post-traumatic headache, not intractable: Secondary | ICD-10-CM | POA: Diagnosis not present

## 2018-10-30 DIAGNOSIS — Y9389 Activity, other specified: Secondary | ICD-10-CM | POA: Insufficient documentation

## 2018-10-30 DIAGNOSIS — R51 Headache: Secondary | ICD-10-CM | POA: Diagnosis present

## 2018-10-30 DIAGNOSIS — Z79899 Other long term (current) drug therapy: Secondary | ICD-10-CM | POA: Diagnosis not present

## 2018-10-30 DIAGNOSIS — M25551 Pain in right hip: Secondary | ICD-10-CM

## 2018-10-30 DIAGNOSIS — Z7984 Long term (current) use of oral hypoglycemic drugs: Secondary | ICD-10-CM | POA: Diagnosis not present

## 2018-10-30 DIAGNOSIS — W19XXXA Unspecified fall, initial encounter: Secondary | ICD-10-CM

## 2018-10-30 DIAGNOSIS — I5042 Chronic combined systolic (congestive) and diastolic (congestive) heart failure: Secondary | ICD-10-CM | POA: Diagnosis not present

## 2018-10-30 DIAGNOSIS — E039 Hypothyroidism, unspecified: Secondary | ICD-10-CM | POA: Insufficient documentation

## 2018-10-30 DIAGNOSIS — Z85850 Personal history of malignant neoplasm of thyroid: Secondary | ICD-10-CM | POA: Insufficient documentation

## 2018-10-30 DIAGNOSIS — E119 Type 2 diabetes mellitus without complications: Secondary | ICD-10-CM | POA: Insufficient documentation

## 2018-10-30 DIAGNOSIS — Y999 Unspecified external cause status: Secondary | ICD-10-CM | POA: Diagnosis not present

## 2018-10-30 DIAGNOSIS — S0990XA Unspecified injury of head, initial encounter: Secondary | ICD-10-CM | POA: Diagnosis not present

## 2018-10-30 DIAGNOSIS — Z87891 Personal history of nicotine dependence: Secondary | ICD-10-CM | POA: Diagnosis not present

## 2018-10-30 DIAGNOSIS — J449 Chronic obstructive pulmonary disease, unspecified: Secondary | ICD-10-CM | POA: Diagnosis not present

## 2018-10-30 DIAGNOSIS — I110 Hypertensive heart disease with heart failure: Secondary | ICD-10-CM | POA: Insufficient documentation

## 2018-10-30 DIAGNOSIS — Y92512 Supermarket, store or market as the place of occurrence of the external cause: Secondary | ICD-10-CM | POA: Diagnosis not present

## 2018-10-30 MED ORDER — DIPHENHYDRAMINE HCL 50 MG/ML IJ SOLN
25.0000 mg | Freq: Once | INTRAMUSCULAR | Status: AC
  Administered 2018-10-30: 25 mg via INTRAMUSCULAR
  Filled 2018-10-30: qty 1

## 2018-10-30 MED ORDER — PROCHLORPERAZINE EDISYLATE 10 MG/2ML IJ SOLN
10.0000 mg | Freq: Once | INTRAMUSCULAR | Status: AC
  Administered 2018-10-30: 20:00:00 10 mg via INTRAMUSCULAR
  Filled 2018-10-30: qty 2

## 2018-10-30 NOTE — ED Provider Notes (Signed)
[facility] EMERGENCY DEPARTMENT Provider Note   CSN: [id number] Arrival date & time: 10/30/18  1718    History   Chief Complaint Chief Complaint  Patient presents with   Fall    HPI Michele Owens is a 58 y.o. female.     58 yo F with a chief complaint of a fall.  The patient unfortunately has multiple medical issues making her essentially dependent on other people almost at all times.  She went to the grocery store and by accident push the button to make the electric grocery cart move forward and she lost her balance and fell backwards and struck her head.  She is complaining of a headache and right hip pain.  Also has pain to bilateral trapezius muscles.  She denies chest pain denies abdominal pain denies lower extremity pain.  They went to urgent care and they recommended they come here for a CT of her head.  Patient is currently at her baseline though has some confusion at baseline per the daughter.  The history is provided by the patient.  Fall This is a new problem. The current episode started less than 1 hour ago. The problem occurs constantly. The problem has not changed since onset.Associated symptoms include headaches. Pertinent negatives include no chest pain, no abdominal pain and no shortness of breath. Nothing aggravates the symptoms. Nothing relieves the symptoms. She has tried nothing for the symptoms. The treatment provided no relief.    Past Medical History:  Diagnosis Date   Anxiety    Arthritis    Asthma    Chronic diastolic CHF (congestive heart failure) (HCC)    COPD (chronic obstructive pulmonary disease) (HCC)    Uses Oxygen at night   Depression    Diabetes mellitus without complication (HCC)    Dyspnea    occ   Family history of thyroid cancer    GERD (gastroesophageal reflux disease)    Gout    Headache    migraines   History of DVT of lower extremity    History of nuclear stress test    Myoview 2/17:  Low risk  stress nuclear study with a small, moderate intensity, partially reversible inferior lateral defect consistent with small prior infarct and minimal peri-infarct ischemia; EF 68 with normal wall motion   Hypertension    Pneumonia    Thyroid cancer ([place]) 09/23/2016    Patient Active Problem List   Diagnosis Date Noted   Morbid obesity with BMI of 50.0-59.9, adult ([place])    CHF exacerbation ([place]) 07/04/2018   Acute heart failure ([place]) 07/04/2018   Fall at home, initial encounter 06/23/2018   HNP (herniated nucleus pulposus), thoracic 06/23/2018   Sprain of shoulder, left 06/18/2018   Strain of thoracic back region 06/18/2018   Strain of lumbar region 06/18/2018   CHF (congestive heart failure) ([place]) 06/18/2018   Pain 06/17/2018   Hypertensive disorder 08/28/2017   Obesity 08/28/2017   COPD exacerbation ([place]) 08/04/2017   Acute and chronic respiratory failure with hypercapnia (HCC)    Acute on chronic diastolic CHF (congestive heart failure) ([place])    Pulmonary HTN ([place])    Genetic testing 02/27/2017   Family history of thyroid cancer    Hypomagnesemia 10/03/2016   Boil, breast 10/03/2016   Atypical chest pain    HLD (hyperlipidemia) 09/23/2016   COPD (chronic obstructive pulmonary disease) ([place]) 09/23/2016   Gout 09/23/2016   DVT (deep vein thrombosis) in pregnancy 09/23/2016   Thyroid cancer ([place]) 09/23/2016  Hypocalcemia 09/23/2016   AKI (acute kidney injury) ([place]) 09/23/2016   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   Acute pulmonary edema ([place])    Acute hypoxemic respiratory failure (HCC)    Acute on chronic respiratory failure with hypoxemia (HCC)    Ventilator dependent ([place])    Respiratory failure ([place]) 07/16/2016   Chronic combined systolic (congestive) and diastolic (congestive) heart failure ([place]) 02/10/2016   Dyspnea    CAP (community acquired pneumonia) 01/23/2016   Multinodular goiter w/ dominant right  thyroid nodule 01/23/2016   Pulmonary hypertension ([place]) 01/23/2016   Sepsis ([place]) 01/23/2016   Obesity hypoventilation syndrome ([place]) 08/26/2015   Chest pain 06/16/2015   Dyslipidemia associated with type 2 diabetes mellitus ([place]) 04/24/2015   Leukocytosis 04/24/2015   Controlled type 2 diabetes mellitus without complication, without long-term current use of insulin ([place]) 04/24/2015   Anxiety 04/24/2015   Benign essential HTN 04/24/2015   Normocytic anemia 04/24/2015   Asthma with COPD with exacerbation ([place]) 04/24/2015   OSA (obstructive sleep apnea) 05/10/2012   Tobacco abuse 07/04/2011    Past Surgical History:  Procedure Laterality Date   BUNIONECTOMY Bilateral    CARDIAC CATHETERIZATION N/A 06/23/2015   Procedure: Right/Left Heart Cath and Coronary Angiography;  Surgeon: [name], MD;  Location: [place] [facility];  Service: Cardiovascular;  Laterality: N/A;   COLONOSCOPY WITH PROPOFOL N/A 12/15/2015   Procedure: COLONOSCOPY WITH PROPOFOL;  Surgeon: [name], MD;  Location: [place];  Service: Endoscopy;  Laterality: N/A;   THYROIDECTOMY  09/19/2016   THYROIDECTOMY N/A 09/19/2016   Procedure: TOTAL THYROIDECTOMY;  Surgeon: [name], MD;  Location: [place];  Service: General;  Laterality: N/A;   TONSILLECTOMY     TOTAL ABDOMINAL HYSTERECTOMY  07/14/10     OB History   No obstetric history on file.      Home Medications    Prior to Admission medications   Medication Sig Start Date End Date Taking? Authorizing Provider  albuterol (PROVENTIL HFA;VENTOLIN HFA) 108 (90 Base) MCG/ACT inhaler Inhale 1-2 puffs into the lungs every 6 (six) hours as needed for wheezing or shortness of breath. 04/23/18   Long, [name], MD  allopurinol (ZYLOPRIM) 100 MG tablet Take 100 mg by mouth daily.    [provider]  ANORO ELLIPTA 62.5-25 MCG/INH AEPB Inhale 1 puff into the lungs daily.  04/11/18   [provider]  Blood Glucose Monitoring Suppl  (ACCU-CHEK AVIVA PLUS) w/Device KIT USE AS DIRECTED TO CHECK BLOOD SUGAR 08/07/17   [provider]  butalbital-acetaminophen-caffeine (FIORICET, ESGIC) 50-325-40 MG tablet Take 1-2 tablets by mouth every 4 (four) hours as needed for migraine. 06/13/18   [provider]  calcitRIOL (ROCALTROL) 0.5 MCG capsule Take 1 capsule (0.5 mcg total) by mouth daily. 09/26/16   Hongalgi, [name], MD  carvedilol (COREG) 6.25 MG tablet Take 1 tablet (6.25 mg total) by mouth 2 (two) times daily with a meal. 01/23/18   Bensimhon, [name], MD  cetirizine (ZYRTEC) 10 MG tablet Take 10 mg by mouth daily. 07/10/17   [provider]  clonazePAM (KLONOPIN) 2 MG tablet Take 2 mg by mouth 3 (three) times daily. for anxiety 05/08/17   [provider]  diclofenac sodium (VOLTAREN) 1 % GEL Apply 2 g topically 4 (four) times daily as needed (for pain). 06/19/18   [name], MD  EPINEPHrine 0.3 mg/0.3 mL IJ SOAJ injection Inject 0.3 mg into the muscle daily as needed (allergic reaction).  [provider]  FEROSUL 325 (65 Fe) MG tablet Take 325 mg by mouth daily. 03/22/17   [provider]  furosemide (LASIX) 20 MG tablet Take 2 tablets (40 mg total) by mouth 2 (two) times daily. Take 60m every morning and 459mevery evening Patient taking differently: Take 40 mg by mouth 2 (two) times daily. Take 4061mvery morning and 73m62mery evening 07/24/18   McLeLarey Dresser  glucose blood test strip Use as instructed to check blood sugar up to 4 times daily 08/06/17   ChoiDessa Phi  guaiFENesin (MUCINEX) 600 MG 12 hr tablet Take 2 tablets (1,200 mg total) by mouth 2 (two) times daily. 07/11/18   Xu, Florencia Reasons  HYDROcodone-acetaminophen (NORCO/VICODIN) 5-325 MG tablet Take 1 tablet by mouth every 6 (six) hours as needed for moderate pain.  02/12/18   [provider]  ipratropium-albuterol (DUONEB) 0.5-2.5 (3) MG/3ML SOLN Take 3 mLs by nebulization 4 (four) times daily.   04/05/17   [provider]  levothyroxine (SYNTHROID, LEVOTHROID) 112 MCG tablet Take 224 mcg by mouth daily. 07/11/17   [provider]  lidocaine (LIDODERM) 5 % Place 1 patch onto the skin daily. Remove & Discard patch within 12 hours or as directed by MD 07/11/18   Xu, Florencia Reasons  lisinopril (PRINIVIL,ZESTRIL) 5 MG tablet Take 1 tablet (5 mg total) by mouth daily. Please call for office visit [phone] 08/15/17   McLeLarey Dresser  loperamide (IMODIUM) 2 MG capsule Take 1 capsule (2 mg total) by mouth 4 (four) times daily as needed for diarrhea or loose stools. 7/160/76/22licDelora Fuel  metFORMIN (GLUCOPHAGE) 500 MG tablet Take 1 tablet (500 mg total) by mouth 2 (two) times daily with a meal. 12/11/14   SullDelfina Redwood  methocarbamol (ROBAXIN) 500 MG tablet Take 1 tablet (500 mg total) by mouth every 8 (eight) hours as needed for muscle spasms. 06/19/18   SmitNorval Morton  metoCLOPramide (REGLAN) 10 MG tablet Take 1 tablet (10 mg total) by mouth every 6 (six) hours as needed for nausea (or headache). 7/166/33/35licDelora Fuel  montelukast (SINGULAIR) 10 MG tablet Take 1 tablet (10 mg total) by mouth at bedtime. 07/11/18   Xu, Florencia Reasons  nitroGLYCERIN (NITROSTAT) 0.4 MG SL tablet Place 1 tablet (0.4 mg total) under the tongue every 5 (five) minutes as needed for chest pain. 06/19/18   SmitNorval Morton  nystatin cream (MYCOSTATIN) Apply 1 application topically 2 (two) times daily as needed for dry skin.  04/13/17   [provider]  omeprazole (PRILOSEC) 40 MG capsule Take 40 mg by mouth daily. 04/13/15   [provider]  OXYGEN Inhale 2 L into the lungs at bedtime.    [provider]  potassium chloride SA (K-DUR,KLOR-CON) 20 MEQ tablet Take 1 tablet (20 mEq total) by mouth 2 (two) times daily. 07/24/18   McLeLarey Dresser  pravastatin (PRAVACHOL) 40 MG tablet Take 40 mg by mouth every evening.  03/29/15   [provider]  QC STOOL  SOFTENER PLS LAXATIVE 8.6-50 MG tablet Take 1 tablet by mouth at bedtime. 04/11/17   [provider]  tiotropium (SPIRIVA) 18 MCG inhalation capsule Place 18 mcg into inhaler and inhale daily.    [provider]    Family History Family History  Problem Relation Age of Onset   Cancer Father        thought to  be due to exposure to concrete   Diabetes Mother    Thyroid cancer Mother        dx in her 58s-60s   Cancer Maternal Uncle        2 uncles with cancer NOS   Brain cancer Paternal Aunt    Cancer Cousin        maternal first cousin - NOS   Cancer Cousin        maternal first cousin - NOS    Social History Social History   Tobacco Use   Smoking status: Former Smoker    Packs/day: 0.50    Years: 41.00    Pack years: 20.50    Types: Cigarettes    Quit date: 07/2016    Years since quitting: 2.3   Smokeless tobacco: Never Used   Tobacco comment: Starting to [employer] -- using Nicotine Patch  Substance Use Topics   Alcohol use: Not Currently    Alcohol/week: 0.0 standard drinks    Comment: ? none now   Drug use: Never    Comment: none for long time     Allergies   Bee venom, Fioricet [butalbital-apap-caffeine], Ibuprofen, Lamisil [terbinafine], and Nsaids   Review of Systems Review of Systems  Constitutional: Negative for chills and fever.  HENT: Negative for congestion and rhinorrhea.   Eyes: Negative for redness and visual disturbance.  Respiratory: Negative for shortness of breath and wheezing.   Cardiovascular: Negative for chest pain and palpitations.  Gastrointestinal: Negative for abdominal pain, nausea and vomiting.  Genitourinary: Negative for dysuria and urgency.  Musculoskeletal: Negative for arthralgias and myalgias.  Skin: Negative for pallor and wound.  Neurological: Positive for headaches. Negative for dizziness.     Physical Exam Updated Vital Signs BP 118/62    Pulse 77    Temp 99.3 F (37.4 C) (Oral)    Resp 19     SpO2 95%   Physical Exam Vitals signs and nursing note reviewed.  Constitutional:      General: She is not in acute distress.    Appearance: She is well-developed. She is morbidly obese. She is not diaphoretic.  HENT:     Head: Normocephalic and atraumatic.  Eyes:     Pupils: Pupils are equal, round, and reactive to light.  Neck:     Musculoskeletal: Normal range of motion and neck supple.  Cardiovascular:     Rate and Rhythm: Normal rate and regular rhythm.     Heart sounds: No murmur. No friction rub. No gallop.   Pulmonary:     Effort: Pulmonary effort is normal.     Breath sounds: No wheezing or rales.  Abdominal:     General: There is no distension.     Palpations: Abdomen is soft.     Tenderness: There is no abdominal tenderness.  Musculoskeletal:        General: No tenderness.     Comments: Pain along bilateral trapezius muscles.  No midline spinal tenderness.  Able to rotate her head 45 degrees in either direction without pain.  Palpated from head to toe without any other noted areas of bony tenderness.  No pain with internal and external rotation of the right lower extremity.  Skin:    General: Skin is warm and dry.  Neurological:     Mental Status: She is alert and oriented to person, place, and time.  Psychiatric:        Behavior: Behavior normal.      ED Treatments /  Results  Labs (all labs ordered are listed, but only abnormal results are displayed) Labs Reviewed - No data to display  EKG None  Radiology Ct Head Wo Contrast  Result Date: 10/30/2018 CLINICAL DATA:  Head trauma, minor, GCS>=13, high clinical risk, initial exam. Patient reports she went to sit down on an electric cart moved out from under her and it caused her to fall and hit her head on the ground EXAM: CT HEAD WITHOUT CONTRAST TECHNIQUE: Contiguous axial images were obtained from the base of the skull through the vertex without intravenous contrast. COMPARISON:  Head CT 02/25/2018  FINDINGS: Brain: No intracranial hemorrhage, mass effect, or midline shift. No hydrocephalus. The basilar cisterns are patent. No evidence of territorial infarct or acute ischemia. No extra-axial or intracranial fluid collection. Vascular: Atherosclerosis of skullbase vasculature without hyperdense vessel or abnormal calcification. Skull: No fracture or focal lesion. Stable mild frontal hyperostosis. Sinuses/Orbits: Paranasal sinuses and mastoid air cells are clear. Tiny mucous retention cyst right maxillary sinus. The visualized orbits are unremarkable. Other: None. IMPRESSION: No acute intracranial abnormality. No skull fracture. Electronically Signed   By: Keith Rake M.D.   On: 10/30/2018 20:54   Dg Hip Unilat W Or Wo Pelvis 2-3 Views Right  Result Date: 10/30/2018 CLINICAL DATA:  Recent fall with right hip pain, initial encounter EXAM: DG HIP (WITH OR WITHOUT PELVIS) 2-3V RIGHT COMPARISON:  07/06/2018 FINDINGS: Pelvic ring is intact. Mild degenerative changes of the hip joints are noted bilaterally. No acute fracture or dislocation is seen. No soft tissue abnormality is noted. IMPRESSION: No acute abnormality noted. Electronically Signed   By: Inez Catalina M.D.   On: 10/30/2018 19:52    Procedures Procedures (including critical care time)  Medications Ordered in ED Medications  prochlorperazine (COMPAZINE) injection 10 mg (10 mg Intramuscular Given 10/30/18 2008)  diphenhydrAMINE (BENADRYL) injection 25 mg (25 mg Intramuscular Given 10/30/18 2005)     Initial Impression / Assessment and Plan / ED Course  I have reviewed the triage vital signs and the nursing notes.  Pertinent labs & imaging results that were available during my care of the patient were reviewed by me and considered in my medical decision making (see chart for details).        58 yo F with a non-syncopal fall from standing.  Struck the back of her head on the right side of her body.  Complaining mostly of a headache  and right hip pain.  C-spine cleared by [place] C-spine rules.  Patient and family are somewhat adamant that she obtain a CT scan of her head.  There was some reported possible loss of consciousness.  She has no neurologic deficits no blood thinner use.  She is at her baseline per her daughter.  CT of the head viewed by me without any intracranial bleeding.  Plain film of the right hip viewed by me without fracture.Marland Kitchen  PCP follow-up.  9:21 PM:  I have discussed the diagnosis/risks/treatment options with the patient and believe the pt to be eligible for discharge home to follow-up with PCP. We also discussed returning to the ED immediately if new or worsening sx occur. We discussed the sx which are most concerning (e.g., sudden worsening pain, fever, inability to tolerate by mouth) that necessitate immediate return. Medications administered to the patient during their visit and any new prescriptions provided to the patient are listed below.  Medications given during this visit Medications  prochlorperazine (COMPAZINE) injection 10 mg (10 mg Intramuscular Given 10/30/18  2008)  diphenhydrAMINE (BENADRYL) injection 25 mg (25 mg Intramuscular Given 10/30/18 2005)     The patient appears reasonably screen and/or stabilized for discharge and I doubt any other medical condition or other [facility] requiring further screening, evaluation, or treatment in the ED at this time prior to discharge.    Final Clinical Impressions(s) / ED Diagnoses   Final diagnoses:  Fall, initial encounter  Acute post-traumatic headache, not intractable  Right hip pain    ED Discharge Orders    None       Deno Etienne, DO 10/30/18 2121

## 2018-10-30 NOTE — ED Notes (Signed)
Patient is being discharged from the [facility] and sent to the Emergency Department via wheelchair by staff. Per [place], [place], patient is stable but in need of higher level of care due to head pain post-fall with possible LOC. Patient is aware and verbalizes understanding of plan of care.  Vitals:   10/30/18 1637  BP: 119/65  Pulse: 88  Resp: 18  Temp: 98.8 F (37.1 C)  SpO2: 98%

## 2018-10-30 NOTE — ED Notes (Signed)
Assisted patient from bathroom.  Patient was in bathroom and had a lot of equipment with her and appeared sob.  Patient did not have her o2 on at that time.  Assisted patient to chair.  Instructed to put on oxygen, patient complied.  Patient reports falling today and complaining of neck/back pain.  Patient was informed that if she had any complaints related to her head, she would need to go to ED.  Patient denies head issues.  Patient refused going to ED.  Explained the ed could do CT scans, again stated we do not do any head xrays or scans.  Patient stated understanding

## 2018-10-30 NOTE — ED Provider Notes (Addendum)
[facility]    CSN: [id number] Arrival date & time: 10/30/18  1557     History   Chief Complaint Chief Complaint  Patient presents with  . Fall    HPI Michele Owens is a 58 y.o. female with history of morbid obesity, CHF, COPD on continuous supplemental oxygen, hypertension, thyroid cancer, diabetes presenting for acute concern head pain status post fall approximately 60 to 90 minutes ago.  Patient states she was trying to get onto a electric grocery cart when it pulled away from her, she fell, and hit the back of her head.  Patient endorsing loss of consciousness.    Past Medical History:  Diagnosis Date  . Anxiety   . Arthritis   . Asthma   . Chronic diastolic CHF (congestive heart failure) ([place])   . COPD (chronic obstructive pulmonary disease) (HCC)    Uses Oxygen at night  . Depression   . Diabetes mellitus without complication ([place])   . Dyspnea    occ  . Family history of thyroid cancer   . GERD (gastroesophageal reflux disease)   . Gout   . Headache    migraines  . History of DVT of lower extremity   . History of nuclear stress test    Myoview 2/17:  Low risk stress nuclear study with a small, moderate intensity, partially reversible inferior lateral defect consistent with small prior infarct and minimal peri-infarct ischemia; EF 68 with normal wall motion  . Hypertension   . Pneumonia   . Thyroid cancer ([place]) 09/23/2016    Patient Active Problem List   Diagnosis Date Noted  . Morbid obesity with BMI of 50.0-59.9, adult ([place])   . CHF exacerbation ([place]) 07/04/2018  . Acute heart failure ([employer]) 07/04/2018  . Fall at home, initial encounter 06/23/2018  . HNP (herniated nucleus pulposus), thoracic 06/23/2018  . Sprain of shoulder, left 06/18/2018  . Strain of thoracic back region 06/18/2018  . Strain of lumbar region 06/18/2018  . CHF (congestive heart failure) ([place]) 06/18/2018  . Pain 06/17/2018  . Hypertensive disorder 08/28/2017  . Obesity  08/28/2017  . COPD exacerbation ([place]) 08/04/2017  . Acute and chronic respiratory failure with hypercapnia ([place])   . Acute on chronic diastolic CHF (congestive heart failure) ([place])   . Pulmonary HTN ([place])   . Genetic testing 02/27/2017  . Family history of thyroid cancer   . Hypomagnesemia 10/03/2016  . Boil, breast 10/03/2016  . Atypical chest pain   . HLD (hyperlipidemia) 09/23/2016  . COPD (chronic obstructive pulmonary disease) ([place]) 09/23/2016  . Gout 09/23/2016  . DVT (deep vein thrombosis) in pregnancy 09/23/2016  . Thyroid cancer ([place]) 09/23/2016  . Hypocalcemia 09/23/2016  . AKI (acute kidney injury) ([place]) 09/23/2016  . 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 Acute pulmonary edema (HCC)   . Acute hypoxemic respiratory failure ([place])   . Acute on chronic respiratory failure with hypoxemia ([place])   . Ventilator dependent ([place])   . Respiratory failure ([place]) 07/16/2016  . Chronic combined systolic (congestive) and diastolic (congestive) heart failure ([place]) 02/10/2016  . Dyspnea   . CAP (community acquired pneumonia) 01/23/2016  . 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 Pulmonary hypertension ([place]) 01/23/2016  . Sepsis ([place]) 01/23/2016  . Obesity hypoventilation syndrome ([place]) 08/26/2015  . Chest pain 06/16/2015  . Dyslipidemia associated with type 2 diabetes mellitus ([place]) 04/24/2015  . Leukocytosis 04/24/2015  . Controlled type 2 diabetes mellitus without complication, without long-term  current use of insulin ([place]) 04/24/2015  . Anxiety 04/24/2015  . Benign essential HTN 04/24/2015  . Normocytic anemia 04/24/2015  . Asthma with COPD with exacerbation ([place]) 04/24/2015  . OSA (obstructive sleep apnea) 05/10/2012  . Tobacco abuse 07/04/2011    Past Surgical History:  Procedure Laterality Date  . BUNIONECTOMY Bilateral   . CARDIAC CATHETERIZATION N/A 06/23/2015   Procedure: Right/Left Heart Cath and Coronary Angiography;   Surgeon: [name], MD;  Location: [facility];  Service: Cardiovascular;  Laterality: N/A;  . COLONOSCOPY WITH PROPOFOL N/A 12/15/2015   Procedure: COLONOSCOPY WITH PROPOFOL;  Surgeon: [name], MD;  Location: [facility];  Service: Endoscopy;  Laterality: N/A;  . THYROIDECTOMY  09/19/2016  . THYROIDECTOMY N/A 09/19/2016   Procedure: TOTAL THYROIDECTOMY;  Surgeon: [name], MD;  Location: [facility];  Service: General;  Laterality: N/A;  . TONSILLECTOMY    . TOTAL ABDOMINAL HYSTERECTOMY  07/14/10    OB History   No obstetric history on file.      Home Medications    Prior to Admission medications   Medication Sig Start Date End Date Taking? Authorizing Provider  albuterol (PROVENTIL HFA;VENTOLIN HFA) 108 (90 Base) MCG/ACT inhaler Inhale 1-2 puffs into the lungs every 6 (six) hours as needed for wheezing or shortness of breath. 04/23/18   Long, [name], MD  allopurinol (ZYLOPRIM) 100 MG tablet Take 100 mg by mouth daily.    [provider]  ANORO ELLIPTA 62.5-25 MCG/INH AEPB Inhale 1 puff into the lungs daily.  04/11/18   [provider]  Blood Glucose Monitoring Suppl (ACCU-CHEK AVIVA PLUS) w/Device KIT USE AS DIRECTED TO CHECK BLOOD SUGAR 08/07/17   [provider]  butalbital-acetaminophen-caffeine (FIORICET, ESGIC) 50-325-40 MG tablet Take 1-2 tablets by mouth every 4 (four) hours as needed for migraine. 06/13/18   [provider]  calcitRIOL (ROCALTROL) 0.5 MCG capsule Take 1 capsule (0.5 mcg total) by mouth daily. 09/26/16   Hongalgi, [name], MD  carvedilol (COREG) 6.25 MG tablet Take 1 tablet (6.25 mg total) by mouth 2 (two) times daily with a meal. 01/23/18   Bensimhon, [name], MD  cetirizine (ZYRTEC) 10 MG tablet Take 10 mg by mouth daily. 07/10/17   [provider]  clonazePAM (KLONOPIN) 2 MG tablet Take 2 mg by mouth 3 (three) times daily. for anxiety 05/08/17   [provider]  diclofenac sodium (VOLTAREN) 1 % GEL  Apply 2 g topically 4 (four) times daily as needed (for pain). 06/19/18   [name], MD  EPINEPHrine 0.3 mg/0.3 mL IJ SOAJ injection Inject 0.3 mg into the muscle daily as needed (allergic reaction).    [provider]  FEROSUL 325 (65 Fe) MG tablet Take 325 mg by mouth daily. 03/22/17   [provider]  furosemide (LASIX) 20 MG tablet Take 2 tablets (40 mg total) by mouth 2 (two) times daily. Take 50m every morning and 421mevery evening Patient taking differently: Take 40 mg by mouth 2 (two) times daily. Take 4016mvery morning and 36m48mery evening 07/24/18   McLeLarey Dresser  glucose blood test strip Use as instructed to check blood sugar up to 4 times daily 08/06/17   ChoiDessa Phi  guaiFENesin (MUCINEX) 600 MG 12 hr tablet Take 2 tablets (1,200 mg total) by mouth 2 (two) times daily. 07/11/18   Xu, Florencia Reasons  HYDROcodone-acetaminophen (NORCO/VICODIN) 5-325 MG tablet Take 1 tablet by mouth every 6 (six) hours as  needed for moderate pain.  02/12/18   [provider]  ipratropium-albuterol (DUONEB) 0.5-2.5 (3) MG/3ML SOLN Take 3 mLs by nebulization 4 (four) times daily.  04/05/17   [provider]  levothyroxine (SYNTHROID, LEVOTHROID) 112 MCG tablet Take 224 mcg by mouth daily. 07/11/17   [provider]  lidocaine (LIDODERM) 5 % Place 1 patch onto the skin daily. Remove & Discard patch within 12 hours or as directed by MD 07/11/18   Florencia Reasons, MD  lisinopril (PRINIVIL,ZESTRIL) 5 MG tablet Take 1 tablet (5 mg total) by mouth daily. Please call for office visit [phone] 08/15/17   [name], MD  loperamide (IMODIUM) 2 MG capsule Take 1 capsule (2 mg total) by mouth 4 (four) times daily as needed for diarrhea or loose stools. 0/45/40   [name], MD  metFORMIN (GLUCOPHAGE) 500 MG tablet Take 1 tablet (500 mg total) by mouth 2 (two) times daily with a meal. 12/11/14   [name], MD  methocarbamol (ROBAXIN) 500 MG tablet Take 1  tablet (500 mg total) by mouth every 8 (eight) hours as needed for muscle spasms. 06/19/18   [name], MD  metoCLOPramide (REGLAN) 10 MG tablet Take 1 tablet (10 mg total) by mouth every 6 (six) hours as needed for nausea (or headache). 9/81/19   [name], MD  montelukast (SINGULAIR) 10 MG tablet Take 1 tablet (10 mg total) by mouth at bedtime. 07/11/18   Florencia Reasons, MD  nitroGLYCERIN (NITROSTAT) 0.4 MG SL tablet Place 1 tablet (0.4 mg total) under the tongue every 5 (five) minutes as needed for chest pain. 06/19/18   [name], MD  nystatin cream (MYCOSTATIN) Apply 1 application topically 2 (two) times daily as needed for dry skin.  04/13/17   [provider]  omeprazole (PRILOSEC) 40 MG capsule Take 40 mg by mouth daily. 04/13/15   [provider]  OXYGEN Inhale 2 L into the lungs at bedtime.    [provider]  potassium chloride SA (K-DUR,KLOR-CON) 20 MEQ tablet Take 1 tablet (20 mEq total) by mouth 2 (two) times daily. 07/24/18   [name], MD  pravastatin (PRAVACHOL) 40 MG tablet Take 40 mg by mouth every evening.  03/29/15   [provider]  QC STOOL SOFTENER PLS LAXATIVE 8.6-50 MG tablet Take 1 tablet by mouth at bedtime. 04/11/17   [provider]  tiotropium (SPIRIVA) 18 MCG inhalation capsule Place 18 mcg into inhaler and inhale daily.    [provider]    Family History Family History  Problem Relation Age of Onset  . Cancer Father        thought to be due to exposure to concrete  . Diabetes Mother   . Thyroid cancer Mother        dx in her 2s-60s  . Cancer Maternal Uncle        2 uncles with cancer NOS  . Brain cancer Paternal Aunt   . Cancer Cousin        maternal first cousin - NOS  . Cancer Cousin        maternal first cousin - NOS    Social History Social History   Tobacco Use  . Smoking status: Former Smoker    Packs/day: 0.50    Years: 41.00    Pack years: 20.50    Types: Cigarettes    Quit  date: 07/2016    Years since quitting: 2.3  . Smokeless tobacco: Never Used  .  Tobacco comment: Starting to Wean Off -- using Nicotine Patch  Substance Use Topics  . Alcohol use: Not Currently    Alcohol/week: 0.0 standard drinks    Comment: ? none now  . Drug use: Never    Comment: none for long time     Allergies   Bee venom, Fioricet [butalbital-apap-caffeine], Ibuprofen, Lamisil [terbinafine], and Nsaids   Review of Systems Review of Systems  Constitutional: Negative for chills and fever.  Eyes: Negative for pain and redness.  Respiratory: Negative for cough and shortness of breath.   Cardiovascular: Negative for chest pain and palpitations.  Neurological: Positive for headaches.       Positive for LOC     Physical Exam Triage Vital Signs ED Triage Vitals [10/30/18 1637]  Enc Vitals Group     BP 119/65     Pulse Rate 88     Resp 18     Temp 98.8 F (37.1 C)     Temp Source Oral     SpO2 98 %     Weight      Height      Head Circumference      Peak Flow      Pain Score      Pain Loc      Pain Edu?      Excl. in [place]?    No data found.  Updated Vital Signs BP 119/65 (BP Location: Right Arm)   Pulse 88   Temp 98.8 F (37.1 C) (Oral)   Resp 18   SpO2 98%   Visual Acuity Right Eye Distance:   Left Eye Distance:   Bilateral Distance:    Right Eye Near:   Left Eye Near:    Bilateral Near:     Physical Exam Constitutional:      General: She is not in acute distress.    Appearance: She is obese.  HENT:     Head: Normocephalic and atraumatic.  Eyes:     General: No scleral icterus.    Pupils: Pupils are equal, round, and reactive to light.  Cardiovascular:     Rate and Rhythm: Normal rate.  Pulmonary:     Effort: Pulmonary effort is normal. No respiratory distress.     Comments: Nasal cannula intact with flow set to 2 L/min Skin:    Coloration: Skin is not jaundiced or pale.  Neurological:     Mental Status: She is alert and oriented to  person, place, and time.      UC Treatments / Results  Labs (all labs ordered are listed, but only abnormal results are displayed) Labs Reviewed - No data to display  EKG   Radiology No results found.  Procedures Procedures (including critical care time)  Medications Ordered in UC Medications - No data to display  Initial Impression / Assessment and Plan / UC Course  I have reviewed the triage vital signs and the nursing notes.  Pertinent labs & imaging results that were available during my care of the patient were reviewed by me and considered in my medical decision making (see chart for details).     See discharge instructions for clinical impression. Final Clinical Impressions(s) / UC Diagnoses   Final diagnoses:  Fall, initial encounter  Injury of head, initial encounter     Discharge Instructions     58 year old female with history of morbid obesity, arthritis, thyroid cancer, migraine, oxygen dependent with chronic use of sedatives and opioids presenting for head pain status post fall.  Patient endorsing loss of consciousness.  Hemodynamically stable, transported via clinical staff to ER for further evaluation.    ED Prescriptions    None     Controlled Substance Prescriptions [place] Controlled Substance Registry consulted? Not Applicable   Quincy Sheehan, PA-C 10/30/18 [address] [place], [place] 10/30/18 1724

## 2018-10-30 NOTE — ED Notes (Signed)
Patient transported to CT 

## 2018-10-30 NOTE — ED Triage Notes (Signed)
Pt states she went to sit down the electric card was on and the cart moved from under her causing her to fall down. Pt states she injured her right side. Pt states that EMS was called. Pt states her back , side and pain on the right side of her head.

## 2018-10-30 NOTE — ED Notes (Signed)
Pt requesting to switch her oxygen supply from her compressor to UC's oxygen tank. Oxygen switched per pt request. Pt stated to this RN that her head was really hurting since her fall, at which point this RN asked if the pt had lost consciousness when she fell; patient denied. This RN suggested that if she is complaining of head pain post-fall, that she may be better helped at the ED, to which pt responded "no, I don't want to go there, I think I am right where I should be taken care of."

## 2018-10-30 NOTE — ED Notes (Signed)
Pt on the phone with daughter

## 2018-10-30 NOTE — Discharge Instructions (Signed)
58 year old female with history of morbid obesity, arthritis, thyroid cancer, migraine, oxygen dependent with chronic use of sedatives and opioids presenting for head pain status post fall.  Patient endorsing loss of consciousness.  Hemodynamically stable, transported via clinical staff to ER for further evaluation.

## 2018-10-30 NOTE — Discharge Instructions (Signed)
Your CT was normal.  Your xray was normal.  Please follow up with your family doc in the office.  Return for worsening headache, confusion.   He will likely hurt worse tomorrow.  That is normal.  If you have new confusion or vomiting please return to be evaluated.  Take tylenol 1000mg (2 extra strength) four times a day.

## 2018-10-30 NOTE — ED Triage Notes (Signed)
Pt reports she went to sit down on an electric cart moved out from under her and it caused her to fall and hit her head on the ground. Pt reports pain to head, both  houlders, lower back, and neck. Pt reports EMS came but she wanted to ride with her daughter

## 2018-10-30 NOTE — ED Notes (Addendum)
Pt in xray

## 2018-11-06 ENCOUNTER — Ambulatory Visit: Payer: Medicaid Other | Admitting: Podiatry

## 2018-11-13 ENCOUNTER — Ambulatory Visit: Payer: Medicaid Other | Admitting: Podiatry

## 2018-11-13 ENCOUNTER — Other Ambulatory Visit: Payer: Self-pay

## 2018-11-13 ENCOUNTER — Ambulatory Visit (INDEPENDENT_AMBULATORY_CARE_PROVIDER_SITE_OTHER): Payer: Medicaid Other

## 2018-11-13 DIAGNOSIS — M79676 Pain in unspecified toe(s): Secondary | ICD-10-CM

## 2018-11-13 DIAGNOSIS — S93491A Sprain of other ligament of right ankle, initial encounter: Secondary | ICD-10-CM

## 2018-11-13 DIAGNOSIS — B351 Tinea unguium: Secondary | ICD-10-CM

## 2018-11-13 DIAGNOSIS — M79674 Pain in right toe(s): Secondary | ICD-10-CM

## 2018-11-13 NOTE — Patient Instructions (Addendum)
Ankle Sprain  An ankle sprain is a stretch or tear in a ligament in the ankle. Ligaments are tissues that connect bones to each other. The two most common types of ankle sprains are:  Inversion sprain. This happens when the foot turns inward and the ankle rolls outward. It affects the ligament on the outside of the foot (lateral ligament).  Eversion sprain. This happens when the foot turns outward and the ankle rolls inward. It affects the ligament on the inner side of the foot (medial ligament). What are the causes? This condition is often caused by accidentally rolling or twisting the ankle. What increases the risk? You are more likely to develop this condition if you play sports. What are the signs or symptoms? Symptoms of this condition include:  Pain in your ankle.  Swelling.  Bruising. This may develop right after you sprain your ankle or 1-2 days later.  Trouble standing or walking, especially when you turn or change directions. How is this diagnosed? This condition is diagnosed with:  A physical exam. During the exam, your health care provider will press on certain parts of your foot and ankle and try to move them in certain ways.  X-ray imaging. These may be taken to see how severe the sprain is and to check for broken bones. How is this treated? This condition may be treated with:  A brace or splint. This is used to keep the ankle from moving until it heals.  An elastic bandage. This is used to support the ankle.  Crutches.  Pain medicine.  Surgery. This may be needed if the sprain is severe.  Physical therapy. This may help to improve the range of motion in the ankle. Follow these instructions at home: If you have a brace or a splint:  Wear the brace or splint as told by your health care provider. Remove it only as told by your health care provider.  Loosen the brace or splint if your toes tingle, become numb, or turn cold and blue.  Keep the brace or  splint clean.  If the brace or splint is not waterproof: ? Do not let it get wet. ? Cover it with a watertight covering when you take a bath or a shower. If you have an elastic bandage (dressing):  Remove it to shower or bathe.  Try not to move your ankle much, but wiggle your toes from time to time. This helps to prevent swelling.  Adjust the dressing to make it more comfortable if it feels too tight.  Loosen the dressing if you have numbness or tingling in your foot, or if your foot becomes cold and blue. Managing pain, stiffness, and swelling   Take over-the-counter and prescription medicines only as told by your health care provider.  For 2-3 days, keep your ankle raised (elevated) above the level of your heart as much as possible.  If directed, put ice on the injured area: ? If you have a removable brace or splint, remove it as told by your health care provider. ? Put ice in a plastic bag. ? Place a towel between your skin and the bag. ? Leave the ice on for 20 minutes, 2-3 times a day. General instructions  Rest your ankle.  Do not use the injured limb to support your body weight until your health care provider says that you can. Use crutches as told by your health care provider.  Do not use any products that contain nicotine or tobacco, such as   cigarettes, e-cigarettes, and chewing tobacco. If you need help quitting, ask your health care provider.  Keep all follow-up visits as told by your health care provider. This is important. Contact a health care provider if:  You have rapidly increasing bruising or swelling.  Your pain is not relieved with medicine. Get help right away if:  Your foot or toes become numb or blue.  You have severe pain that gets worse. Summary  An ankle sprain is a stretch or tear in a ligament in the ankle. Ligaments are tissues that connect bones to each other.  This condition is often caused by accidentally rolling or twisting the ankle.   Symptoms include pain, swelling, bruising, and trouble walking.  To relieve pain and swelling, put ice on the affected ankle, raise your ankle above the level of your heart, and use an elastic bandage.  Keep all follow-up visits as told by your health care provider. This is important. This information is not intended to replace advice given to you by your health care provider. Make sure you discuss any questions you have with your health care provider. Document Released: 03/27/2005 Document Revised: 12/17/2017 Document Reviewed: 08/21/2017 Elsevier Patient Education  [place].  Diabetes Mellitus and [place] care is an important part of your health, especially when you have diabetes. Diabetes may cause you to have problems because of poor blood flow (circulation) to your feet and legs, which can cause your skin to:  Become thinner and drier.  Break more easily.  Heal more slowly.  Peel and crack. You may also have nerve damage (neuropathy) in your legs and feet, causing decreased feeling in them. This means that you may not notice minor injuries to your feet that could lead to more serious problems. Noticing and addressing any potential problems early is the best way to prevent future foot problems. How to care for your feet Foot hygiene  Wash your feet daily with warm water and mild soap. Do not use hot water. Then, pat your feet and the areas between your toes until they are completely dry. Do not soak your feet as this can dry your skin.  Trim your toenails straight across. Do not dig under them or around the cuticle. File the edges of your nails with an emery board or nail file.  Apply a moisturizing lotion or petroleum jelly to the skin on your feet and to dry, brittle toenails. Use lotion that does not contain alcohol and is unscented. Do not apply lotion between your toes. Shoes and socks  Wear clean socks or stockings every day. Make sure they are not too  tight. Do not wear knee-high stockings since they may decrease blood flow to your legs.  Wear shoes that fit properly and have enough cushioning. Always look in your shoes before you put them on to be sure there are no objects inside.  To break in new shoes, wear them for just a few hours a day. This prevents injuries on your feet. Wounds, scrapes, corns, and calluses  Check your feet daily for blisters, cuts, bruises, sores, and redness. If you cannot see the bottom of your feet, use a mirror or ask someone for help.  Do not cut corns or calluses or try to remove them with medicine.  If you find a minor scrape, cut, or break in the skin on your feet, keep it and the skin around it clean and dry. You may clean these areas with mild soap and  water. Do not clean the area with peroxide, alcohol, or iodine.  If you have a wound, scrape, corn, or callus on your foot, look at it several times a day to make sure it is healing and not infected. Check for: ? Redness, swelling, or pain. ? Fluid or blood. ? Warmth. ? Pus or a bad smell. General instructions  Do not cross your legs. This may decrease blood flow to your feet.  Do not use heating pads or hot water bottles on your feet. They may burn your skin. If you have lost feeling in your feet or legs, you may not know this is happening until it is too late.  Protect your feet from hot and cold by wearing shoes, such as at the beach or on hot pavement.  Schedule a complete foot exam at least once a year (annually) or more often if you have foot problems. If you have foot problems, report any cuts, sores, or bruises to your health care provider immediately. Contact a health care provider if:  You have a medical condition that increases your risk of infection and you have any cuts, sores, or bruises on your feet.  You have an injury that is not healing.  You have redness on your legs or feet.  You feel burning or tingling in your legs or feet.   You have pain or cramps in your legs and feet.  Your legs or feet are numb.  Your feet always feel cold.  You have pain around a toenail. Get help right away if:  You have a wound, scrape, corn, or callus on your foot and: ? You have pain, swelling, or redness that gets worse. ? You have fluid or blood coming from the wound, scrape, corn, or callus. ? Your wound, scrape, corn, or callus feels warm to the touch. ? You have pus or a bad smell coming from the wound, scrape, corn, or callus. ? You have a fever. ? You have a red line going up your leg. Summary  Check your feet every day for cuts, sores, red spots, swelling, and blisters.  Moisturize feet and legs daily.  Wear shoes that fit properly and have enough cushioning.  If you have foot problems, report any cuts, sores, or bruises to your health care provider immediately.  Schedule a complete foot exam at least once a year (annually) or more often if you have foot problems. This information is not intended to replace advice given to you by your health care provider. Make sure you discuss any questions you have with your health care provider. Document Released: 03/24/2000 Document Revised: 05/09/2017 Document Reviewed: 04/28/2016 Elsevier Patient Education  [place]? An infection that lies within the keratin of your nail plate that is caused by a fungus.  WHY ME? Fungal infections affect all ages, sexes, races, and creeds.  There may be many factors that predispose you to a fungal infection such as age, coexisting medical conditions such as diabetes, or an autoimmune disease; stress, medications, fatigue, genetics, etc.  Bottom line: fungus thrives in a warm, moist environment and your shoes offer such a location.  IS IT CONTAGIOUS? Theoretically, yes.  You do not want to share shoes, nail clippers or files with someone who has fungal toenails.  Walking around barefoot in  the same room or sleeping in the same bed is unlikely to transfer the organism.  It is important to realize, however, that fungus can  spread easily from one nail to the next on the same foot.  HOW DO WE TREAT THIS?  There are several ways to treat this condition.  Treatment may depend on many factors such as age, medications, pregnancy, liver and kidney conditions, etc.  It is best to ask your doctor which options are available to you.  1. No treatment.   Unlike many other medical concerns, you can live with this condition.  However for many people this can be a painful condition and may lead to ingrown toenails or a bacterial infection.  It is recommended that you keep the nails cut short to help reduce the amount of fungal nail. 2. Topical treatment.  These range from herbal remedies to prescription strength nail lacquers.  About 40-50% effective, topicals require twice daily application for approximately 9 to 12 months or until an entirely new nail has grown out.  The most effective topicals are medical grade medications available through physicians offices. 3. Oral antifungal medications.  With an 80-90% cure rate, the most common oral medication requires 3 to 4 months of therapy and stays in your system for a year as the new nail grows out.  Oral antifungal medications do require blood work to make sure it is a safe drug for you.  A liver function panel will be performed prior to starting the medication and after the first month of treatment.  It is important to have the blood work performed to avoid any harmful side effects.  In general, this medication safe but blood work is required. 4. Laser Therapy.  This treatment is performed by applying a specialized laser to the affected nail plate.  This therapy is noninvasive, fast, and non-painful.  It is not covered by insurance and is therefore, out of pocket.  The results have been very good with a 80-95% cure rate.  The [facility] is the only  practice in the area to offer this therapy. 5. Permanent Nail Avulsion.  Removing the entire nail so that a new nail will not grow back.

## 2018-11-15 ENCOUNTER — Telehealth (HOSPITAL_COMMUNITY): Payer: Self-pay

## 2018-11-15 NOTE — Telephone Encounter (Signed)
I called Michele Owens to schedule a time for me to see her today.  She said that she sprained her ankle and is laying around today.  She asked me to come Wednesday, because she has an doctors appointment.

## 2018-11-17 ENCOUNTER — Encounter: Payer: Self-pay | Admitting: Podiatry

## 2018-11-17 NOTE — Progress Notes (Signed)
Subjective:  Michele Owens presents to clinic today with cc of  painful, thick, discolored, elongated toenails 1-5 b/l that become tender and cannot cut because of thickness. Pain is aggravated when wearing enclosed shoe gear.  Michele Owens states she twisted her right ankle on Monday night trying to stand up. She denies any swelling or bruising, but states it is tender.   Current Outpatient Medications:  .  albuterol (PROVENTIL HFA;VENTOLIN HFA) 108 (90 Base) MCG/ACT inhaler, Inhale 1-2 puffs into the lungs every 6 (six) hours as needed for wheezing or shortness of breath., Disp: 1 Inhaler, Rfl: 0 .  allopurinol (ZYLOPRIM) 100 MG tablet, Take 100 mg by mouth daily., Disp: , Rfl:  .  ANORO ELLIPTA 62.5-25 MCG/INH AEPB, Inhale 1 puff into the lungs daily. , Disp: , Rfl:  .  Blood Glucose Monitoring Suppl (ACCU-CHEK AVIVA PLUS) w/Device KIT, USE AS DIRECTED TO CHECK BLOOD SUGAR, Disp: , Rfl: 0 .  butalbital-acetaminophen-caffeine (FIORICET, ESGIC) 50-325-40 MG tablet, Take 1-2 tablets by mouth every 4 (four) hours as needed for migraine., Disp: , Rfl:  .  calcitRIOL (ROCALTROL) 0.5 MCG capsule, Take 1 capsule (0.5 mcg total) by mouth daily., Disp: 30 capsule, Rfl: 0 .  carvedilol (COREG) 6.25 MG tablet, Take 1 tablet (6.25 mg total) by mouth 2 (two) times daily with a meal., Disp: 60 tablet, Rfl: 0 .  cetirizine (ZYRTEC) 10 MG tablet, Take 10 mg by mouth daily., Disp: , Rfl: 2 .  clonazePAM (KLONOPIN) 2 MG tablet, Take 2 mg by mouth 3 (three) times daily. for anxiety, Disp: , Rfl: 1 .  diclofenac sodium (VOLTAREN) 1 % GEL, Apply 2 g topically 4 (four) times daily as needed (for pain)., Disp: 1 Tube, Rfl: 0 .  EPINEPHrine 0.3 mg/0.3 mL IJ SOAJ injection, Inject 0.3 mg into the muscle daily as needed (allergic reaction)., Disp: , Rfl:  .  FEROSUL 325 (65 Fe) MG tablet, Take 325 mg by mouth daily., Disp: , Rfl: 3 .  furosemide (LASIX) 20 MG tablet, Take 2 tablets (40 mg total) by mouth 2 (two) times  daily. Take 63m every morning and 41mevery evening (Patient taking differently: Take 40 mg by mouth 2 (two) times daily. Take 4021mvery morning and 38m60mery evening), Disp: 360 tablet, Rfl: 0 .  glucose blood test strip, Use as instructed to check blood sugar up to 4 times daily, Disp: 100 each, Rfl: 0 .  guaiFENesin (MUCINEX) 600 MG 12 hr tablet, Take 2 tablets (1,200 mg total) by mouth 2 (two) times daily., Disp: 60 tablet, Rfl: 0 .  HYDROcodone-acetaminophen (NORCO/VICODIN) 5-325 MG tablet, Take 1 tablet by mouth every 6 (six) hours as needed for moderate pain. , Disp: , Rfl: 0 .  ipratropium-albuterol (DUONEB) 0.5-2.5 (3) MG/3ML SOLN, Take 3 mLs by nebulization 4 (four) times daily. , Disp: , Rfl: 3 .  levothyroxine (SYNTHROID, LEVOTHROID) 112 MCG tablet, Take 224 mcg by mouth daily., Disp: , Rfl: 5 .  lidocaine (LIDODERM) 5 %, Place 1 patch onto the skin daily. Remove & Discard patch within 12 hours or as directed by MD, Disp: 5 patch, Rfl: 0 .  lisinopril (PRINIVIL,ZESTRIL) 5 MG tablet, Take 1 tablet (5 mg total) by mouth daily. Please call for office visit [phone]: 30 tablet, Rfl: 0 .  loperamide (IMODIUM) 2 MG capsule, Take 1 capsule (2 mg total) by mouth 4 (four) times daily as needed for diarrhea or loose stools., Disp: 12 capsule, Rfl: 0 .  metFORMIN (GLUCOPHAGE)  500 MG tablet, Take 1 tablet (500 mg total) by mouth 2 (two) times daily with a meal., Disp: 30 tablet, Rfl: 1 .  methocarbamol (ROBAXIN) 500 MG tablet, Take 1 tablet (500 mg total) by mouth every 8 (eight) hours as needed for muscle spasms., Disp: 20 tablet, Rfl: 0 .  metoCLOPramide (REGLAN) 10 MG tablet, Take 1 tablet (10 mg total) by mouth every 6 (six) hours as needed for nausea (or headache)., Disp: 30 tablet, Rfl: 0 .  metoprolol succinate (TOPROL-XL) 25 MG 24 hr tablet, TAKE ONE TABLET BY MOUTH ONCE EVERY MORNING, Disp: , Rfl:  .  montelukast (SINGULAIR) 10 MG tablet, Take 1 tablet (10 mg total) by mouth at  bedtime., Disp: 30 tablet, Rfl: 0 .  nitroGLYCERIN (NITROSTAT) 0.4 MG SL tablet, Place 1 tablet (0.4 mg total) under the tongue every 5 (five) minutes as needed for chest pain., Disp: 12 tablet, Rfl: 0 .  nystatin cream (MYCOSTATIN), Apply 1 application topically 2 (two) times daily as needed for dry skin. , Disp: , Rfl: 1 .  omeprazole (PRILOSEC) 40 MG capsule, Take 40 mg by mouth daily., Disp: , Rfl: 2 .  OXYGEN, Inhale 2 L into the lungs at bedtime., Disp: , Rfl:  .  potassium chloride SA (K-DUR,KLOR-CON) 20 MEQ tablet, Take 1 tablet (20 mEq total) by mouth 2 (two) times daily., Disp: 180 tablet, Rfl: 0 .  pravastatin (PRAVACHOL) 40 MG tablet, Take 40 mg by mouth every evening. , Disp: , Rfl: 2 .  QC STOOL SOFTENER PLS LAXATIVE 8.6-50 MG tablet, Take 1 tablet by mouth at bedtime., Disp: , Rfl: 2 .  tiotropium (SPIRIVA) 18 MCG inhalation capsule, Place 18 mcg into inhaler and inhale daily., Disp: , Rfl:    Allergies  Allergen Reactions  . Bee Venom Swelling and Other (See Comments)    "All over my body" (swelling)  . Fioricet [Butalbital-Apap-Caffeine] Nausea And Vomiting and Rash  . Ibuprofen Rash and Other (See Comments)    Severe rash  . Lamisil [Terbinafine] Rash and Other (See Comments)    Pt states this causes her to "feel funny"  . Nsaids Other (See Comments)    Per MD's orders      Objective: Physical Examination:  Vascular Examination: Capillary refill time <4 seconds x 10 digits.  DP/PT pulses 1/4 b/l.  Digital hair sparsev b/l.  Edema noted b/l LE.  Skin temperature gradient WNL b/l.  Dermatological Examination: Skin with normal turgor, texture and tone b/l.  No open wounds b/l.  No interdigital macerations noted b/l.  Elongated, thick, discolored brittle toenails with subungual debris and pain on dorsal palpation of nailbeds 3-5 right, 2-5 left foot.  Anonychia b/l great toes and right 2nd digit with evidence of permanent total nail avulsion. Nailbeds  completely epithelialized and intact.  Musculoskeletal Examination: Muscle strength 5/5 to all muscle groups b/l.  Mild hammertoe deformity 2-5 b/l.  Tenderness to palpation along anterolateral ankle right foot. No increased edema, no ecchymosis.   No pain, crepitus or joint discomfort with active/passive ROM.  Neurological Examination: Sensation intact decreased with 10 gram monofilament.  Vibratory sensation intact b/l.  Xrays right foot/ankle No acute fracture noted  Assessment: Mycotic nail infection with pain 1-5 b/l Ankle sprain right LE NIDDM  Plan: 1. Toenails 1-5 b/l were debrided in length and girth without iatrogenic laceration. 2. Discussed ankle sprain right LE. Initiate RICE therapy. 2.  Continue soft, supportive shoe gear daily. 3.  Report any pedal injuries to medical  professional. 4.  Follow up 3 months. 5.  Patient/POA to call should there be a question/concern in there interim.

## 2018-11-26 ENCOUNTER — Ambulatory Visit: Payer: Medicaid Other | Admitting: Podiatry

## 2018-11-26 ENCOUNTER — Other Ambulatory Visit (HOSPITAL_COMMUNITY): Payer: Self-pay

## 2018-11-26 NOTE — Progress Notes (Signed)
Paramedicine Encounter    Patient ID: Michele Owens, female    DOB: Oct 25, 1960, 58 y.o.   MRN: [id number]    Patient Care Team: Sharmon Revere as Referring Physician (Physician Assistant)  Patient Active Problem List   Diagnosis Date Noted  . Morbid obesity with BMI of 50.0-59.9, adult ([place])   . CHF exacerbation ([place]) 07/04/2018  . Acute heart failure ([place]) 07/04/2018  . Fall at home, initial encounter 06/23/2018  . HNP (herniated nucleus pulposus), thoracic 06/23/2018  . Sprain of shoulder, left 06/18/2018  . Strain of thoracic back region 06/18/2018  . Strain of lumbar region 06/18/2018  . CHF (congestive heart failure) ([place]) 06/18/2018  . Pain 06/17/2018  . Hypertensive disorder 08/28/2017  . Obesity 08/28/2017  . COPD exacerbation ([place]) 08/04/2017  . Acute and chronic respiratory failure with hypercapnia ([place])   . Acute on chronic diastolic CHF (congestive heart failure) ([place])   . Pulmonary HTN ([place])   . Genetic testing 02/27/2017  . Family history of thyroid cancer   . Hypomagnesemia 10/03/2016  . Boil, breast 10/03/2016  . Atypical chest pain   . HLD (hyperlipidemia) 09/23/2016  . COPD (chronic obstructive pulmonary disease) ([place]) 09/23/2016  . Gout 09/23/2016  . DVT (deep vein thrombosis) in pregnancy 09/23/2016  . Thyroid cancer ([place]) 09/23/2016  . Hypocalcemia 09/23/2016  . AKI (acute kidney injury) ([place]) 09/23/2016  . 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 Acute pulmonary edema (HCC)   . Acute hypoxemic respiratory failure ([place])   . Acute on chronic respiratory failure with hypoxemia ([place])   . Ventilator dependent ([place])   . Respiratory failure ([place]) 07/16/2016  . Chronic combined systolic (congestive) and diastolic (congestive) heart failure ([place]) 02/10/2016  . Dyspnea   . CAP (community acquired pneumonia) 01/23/2016  . 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 Pulmonary hypertension ([place]) 01/23/2016   . Sepsis ([place]) 01/23/2016  . Obesity hypoventilation syndrome ([place]) 08/26/2015  . Chest pain 06/16/2015  . Dyslipidemia associated with type 2 diabetes mellitus ([place]) 04/24/2015  . Leukocytosis 04/24/2015  . Controlled type 2 diabetes mellitus without complication, without long-term current use of insulin ([place]) 04/24/2015  . Anxiety 04/24/2015  . Benign essential HTN 04/24/2015  . Normocytic anemia 04/24/2015  . Asthma with COPD with exacerbation ([place]) 04/24/2015  . OSA (obstructive sleep apnea) 05/10/2012  . Tobacco abuse 07/04/2011    Current Outpatient Medications:  .  allopurinol (ZYLOPRIM) 100 MG tablet, Take 100 mg by mouth daily., Disp: , Rfl:  .  butalbital-acetaminophen-caffeine (FIORICET, ESGIC) 50-325-40 MG tablet, Take 1-2 tablets by mouth every 4 (four) hours as needed for migraine., Disp: , Rfl:  .  calcitRIOL (ROCALTROL) 0.5 MCG capsule, Take 1 capsule (0.5 mcg total) by mouth daily., Disp: 30 capsule, Rfl: 0 .  carvedilol (COREG) 6.25 MG tablet, Take 1 tablet (6.25 mg total) by mouth 2 (two) times daily with a meal., Disp: 60 tablet, Rfl: 0 .  cetirizine (ZYRTEC) 10 MG tablet, Take 10 mg by mouth daily., Disp: , Rfl: 2 .  FEROSUL 325 (65 Fe) MG tablet, Take 325 mg by mouth daily., Disp: , Rfl: 3 .  furosemide (LASIX) 20 MG tablet, Take 2 tablets (40 mg total) by mouth 2 (two) times daily. Take 57m every morning and 439mevery evening (Patient taking differently: Take 40 mg by mouth 2 (two) times daily. Take 4091mvery morning and 14m23mery evening), Disp: 360 tablet, Rfl: 0 .  levothyroxine (SYNTHROID, LEVOTHROID)  112 MCG tablet, Take 224 mcg by mouth daily., Disp: , Rfl: 5 .  lisinopril (PRINIVIL,ZESTRIL) 5 MG tablet, Take 1 tablet (5 mg total) by mouth daily. Please call for office visit [phone], Disp: 30 tablet, Rfl: 0 .  albuterol (PROVENTIL HFA;VENTOLIN HFA) 108 (90 Base) MCG/ACT inhaler, Inhale 1-2 puffs into the lungs every 6 (six) hours as needed for wheezing  or shortness of breath., Disp: 1 Inhaler, Rfl: 0 .  ANORO ELLIPTA 62.5-25 MCG/INH AEPB, Inhale 1 puff into the lungs daily. , Disp: , Rfl:  .  Blood Glucose Monitoring Suppl (ACCU-CHEK AVIVA PLUS) w/Device KIT, USE AS DIRECTED TO CHECK BLOOD SUGAR, Disp: , Rfl: 0 .  clonazePAM (KLONOPIN) 2 MG tablet, Take 2 mg by mouth 3 (three) times daily. for anxiety, Disp: , Rfl: 1 .  diclofenac sodium (VOLTAREN) 1 % GEL, Apply 2 g topically 4 (four) times daily as needed (for pain)., Disp: 1 Tube, Rfl: 0 .  EPINEPHrine 0.3 mg/0.3 mL IJ SOAJ injection, Inject 0.3 mg into the muscle daily as needed (allergic reaction)., Disp: , Rfl:  .  glucose blood test strip, Use as instructed to check blood sugar up to 4 times daily, Disp: 100 each, Rfl: 0 .  guaiFENesin (MUCINEX) 600 MG 12 hr tablet, Take 2 tablets (1,200 mg total) by mouth 2 (two) times daily., Disp: 60 tablet, Rfl: 0 .  HYDROcodone-acetaminophen (NORCO/VICODIN) 5-325 MG tablet, Take 1 tablet by mouth every 6 (six) hours as needed for moderate pain. , Disp: , Rfl: 0 .  ipratropium-albuterol (DUONEB) 0.5-2.5 (3) MG/3ML SOLN, Take 3 mLs by nebulization 4 (four) times daily. , Disp: , Rfl: 3 .  lidocaine (LIDODERM) 5 %, Place 1 patch onto the skin daily. Remove & Discard patch within 12 hours or as directed by MD, Disp: 5 patch, Rfl: 0 .  loperamide (IMODIUM) 2 MG capsule, Take 1 capsule (2 mg total) by mouth 4 (four) times daily as needed for diarrhea or loose stools., Disp: 12 capsule, Rfl: 0 .  metFORMIN (GLUCOPHAGE) 500 MG tablet, Take 1 tablet (500 mg total) by mouth 2 (two) times daily with a meal., Disp: 30 tablet, Rfl: 1 .  methocarbamol (ROBAXIN) 500 MG tablet, Take 1 tablet (500 mg total) by mouth every 8 (eight) hours as needed for muscle spasms., Disp: 20 tablet, Rfl: 0 .  metoCLOPramide (REGLAN) 10 MG tablet, Take 1 tablet (10 mg total) by mouth every 6 (six) hours as needed for nausea (or headache)., Disp: 30 tablet, Rfl: 0 .  metoprolol succinate  (TOPROL-XL) 25 MG 24 hr tablet, TAKE ONE TABLET BY MOUTH ONCE EVERY MORNING, Disp: , Rfl:  .  montelukast (SINGULAIR) 10 MG tablet, Take 1 tablet (10 mg total) by mouth at bedtime., Disp: 30 tablet, Rfl: 0 .  nitroGLYCERIN (NITROSTAT) 0.4 MG SL tablet, Place 1 tablet (0.4 mg total) under the tongue every 5 (five) minutes as needed for chest pain., Disp: 12 tablet, Rfl: 0 .  nystatin cream (MYCOSTATIN), Apply 1 application topically 2 (two) times daily as needed for dry skin. , Disp: , Rfl: 1 .  omeprazole (PRILOSEC) 40 MG capsule, Take 40 mg by mouth daily., Disp: , Rfl: 2 .  OXYGEN, Inhale 2 L into the lungs at bedtime., Disp: , Rfl:  .  potassium chloride SA (K-DUR,KLOR-CON) 20 MEQ tablet, Take 1 tablet (20 mEq total) by mouth 2 (two) times daily., Disp: 180 tablet, Rfl: 0 .  pravastatin (PRAVACHOL) 40 MG tablet, Take 40 mg by mouth  every evening. , Disp: , Rfl: 2 .  QC STOOL SOFTENER PLS LAXATIVE 8.6-50 MG tablet, Take 1 tablet by mouth at bedtime., Disp: , Rfl: 2 .  tiotropium (SPIRIVA) 18 MCG inhalation capsule, Place 18 mcg into inhaler and inhale daily., Disp: , Rfl:  Allergies  Allergen Reactions  . Bee Venom Swelling and Other (See Comments)    "All over my body" (swelling)  . Fioricet [Butalbital-Apap-Caffeine] Nausea And Vomiting and Rash  . Ibuprofen Rash and Other (See Comments)    Severe rash  . Lamisil [Terbinafine] Rash and Other (See Comments)    Pt states this causes her to "feel funny"  . Nsaids Other (See Comments)    Per MD's orders      Social History   Socioeconomic History  . Marital status: Single    Spouse name: Not on file  . Number of children: Not on file  . Years of education: Not on file  . Highest education level: Not on file  Occupational History  . Occupation: unemployeed  Social Needs  . Financial resource strain: Not on file  . Food insecurity    Worry: Not on file    Inability: Not on file  . Transportation needs    Medical: Not on file     Non-medical: Not on file  Tobacco Use  . Smoking status: Former Smoker    Packs/day: 0.50    Years: 41.00    Pack years: 20.50    Types: Cigarettes    Quit date: 07/2016    Years since quitting: 2.3  . Smokeless tobacco: Never Used  . Tobacco comment: Starting to Wean Off -- using Nicotine Patch  Substance and Sexual Activity  . Alcohol use: Not Currently    Alcohol/week: 0.0 standard drinks    Comment: ? none now  . Drug use: Never    Comment: none for long time  . Sexual activity: Yes  Lifestyle  . Physical activity    Days per week: Not on file    Minutes per session: Not on file  . Stress: Not on file  Relationships  . Social Herbalist on phone: Not on file    Gets together: Not on file    Attends religious service: Not on file    Active member of club or organization: Not on file    Attends meetings of clubs or organizations: Not on file    Relationship status: Not on file  . Intimate partner violence    Fear of current or ex partner: Not on file    Emotionally abused: Not on file    Physically abused: Not on file    Forced sexual activity: Not on file  Other Topics Concern  . Not on file  Social History Narrative  . Not on file    Physical Exam Pulmonary:     Effort: Pulmonary effort is normal. No respiratory distress.     Breath sounds: No wheezing or rales.  Abdominal:     General: There is no distension.         Future Appointments  Date Time Provider [place]  02/11/2019  9:30 AM Marzetta Board, DPM [facility]     BP 118/70 (BP Location: Right Arm, Patient Position: Sitting, Cuff Size: Large)   Pulse 76   Temp (!) 97.2 F (36.2 C)   Resp 15   SpO2 97%    ATF pt CAO x4 standing in the kitchen c/o her oven  not working.  She says that she feels "good" and doesn't have any complaints. She tripped and fell last week over a cart in the store. She denies injury and pain; Mrs. Hobart also denies sob, chest pain  and dizziness. Her medication blister packs and PRN meds verified.  Her scale is still out, so she hasn't weighed in several weeks.  However, she said that her weight has gone down by 10lbs.     Medication ordered: none  Isamar Nazir, EMT Paramedic [phone] 11/26/2018    ACTION: Home visit completed

## 2018-12-13 ENCOUNTER — Other Ambulatory Visit (HOSPITAL_COMMUNITY): Payer: Self-pay

## 2018-12-13 NOTE — Progress Notes (Signed)
Paramedicine Encounter    Patient ID: Michele Owens, female    DOB: Nov 27, 1960, 58 y.o.   MRN: [id number]    Patient Care Team: Sharmon Revere as Referring Physician (Physician Assistant)  Patient Active Problem List   Diagnosis Date Noted  . Morbid obesity with BMI of 50.0-59.9, adult ([place])   . CHF exacerbation ([place]) 07/04/2018  . Acute heart failure ([place]) 07/04/2018  . Fall at home, initial encounter 06/23/2018  . HNP (herniated nucleus pulposus), thoracic 06/23/2018  . Sprain of shoulder, left 06/18/2018  . Strain of thoracic back region 06/18/2018  . Strain of lumbar region 06/18/2018  . CHF (congestive heart failure) ([place]) 06/18/2018  . Pain 06/17/2018  . Hypertensive disorder 08/28/2017  . Obesity 08/28/2017  . COPD exacerbation ([place]) 08/04/2017  . Acute and chronic respiratory failure with hypercapnia ([place])   . Acute on chronic diastolic CHF (congestive heart failure) ([place])   . Pulmonary HTN ([place])   . Genetic testing 02/27/2017  . Family history of thyroid cancer   . Hypomagnesemia 10/03/2016  . Boil, breast 10/03/2016  . Atypical chest pain   . HLD (hyperlipidemia) 09/23/2016  . COPD (chronic obstructive pulmonary disease) ([place]) 09/23/2016  . Gout 09/23/2016  . DVT (deep vein thrombosis) in pregnancy 09/23/2016  . Thyroid cancer ([place]) 09/23/2016  . Hypocalcemia 09/23/2016  . AKI (acute kidney injury) ([place]) 09/23/2016  . 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 Acute pulmonary edema (H[facility])   . Acute hypoxemic respiratory failure ([place])   . Acute on chronic respiratory failure with hypoxemia ([place])   . Ventilator dependent ([place])   . Respiratory failure ([place]) 07/16/2016  . Chronic combined systolic (congestive) and diastolic (congestive) heart failure ([place]) 02/10/2016  . Dyspnea   . CAP (community acquired pneumonia) 01/23/2016  . 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 Pulmonary hypertension ([place]) 01/23/2016   . Sepsis ([place]) 01/23/2016  . Obesity hypoventilation syndrome ([place]) 08/26/2015  . Chest pain 06/16/2015  . Dyslipidemia associated with type 2 diabetes mellitus ([place]) 04/24/2015  . Leukocytosis 04/24/2015  . Controlled type 2 diabetes mellitus without complication, without long-term current use of insulin ([place]) 04/24/2015  . Anxiety 04/24/2015  . Benign essential HTN 04/24/2015  . Normocytic anemia 04/24/2015  . Asthma with COPD with exacerbation ([place]) 04/24/2015  . OSA (obstructive sleep apnea) 05/10/2012  . Tobacco abuse 07/04/2011    Current Outpatient Medications:  .  albuterol (PROVENTIL HFA;VENTOLIN HFA) 108 (90 Base) MCG/ACT inhaler, Inhale 1-2 puffs into the lungs every 6 (six) hours as needed for wheezing or shortness of breath., Disp: 1 Inhaler, Rfl: 0 .  allopurinol (ZYLOPRIM) 100 MG tablet, Take 100 mg by mouth daily., Disp: , Rfl:  .  ANORO ELLIPTA 62.5-25 MCG/INH AEPB, Inhale 1 puff into the lungs daily. , Disp: , Rfl:  .  Blood Glucose Monitoring Suppl (ACCU-CHEK AVIVA PLUS) w/Device KIT, USE AS DIRECTED TO CHECK BLOOD SUGAR, Disp: , Rfl: 0 .  butalbital-acetaminophen-caffeine (FIORICET, ESGIC) 50-325-40 MG tablet, Take 1-2 tablets by mouth every 4 (four) hours as needed for migraine., Disp: , Rfl:  .  calcitRIOL (ROCALTROL) 0.5 MCG capsule, Take 1 capsule (0.5 mcg total) by mouth daily., Disp: 30 capsule, Rfl: 0 .  carvedilol (COREG) 6.25 MG tablet, Take 1 tablet (6.25 mg total) by mouth 2 (two) times daily with a meal., Disp: 60 tablet, Rfl: 0 .  cetirizine (ZYRTEC) 10 MG tablet, Take 10 mg by mouth daily., Disp: , Rfl: 2 .  clonazePAM (KLONOPIN) 2 MG tablet, Take 2 mg by mouth 3 (three) times daily. for anxiety, Disp: , Rfl: 1 .  diclofenac sodium (VOLTAREN) 1 % GEL, Apply 2 g topically 4 (four) times daily as needed (for pain)., Disp: 1 Tube, Rfl: 0 .  EPINEPHrine 0.3 mg/0.3 mL IJ SOAJ injection, Inject 0.3 mg into the muscle daily as needed (allergic reaction)., Disp: ,  Rfl:  .  FEROSUL 325 (65 Fe) MG tablet, Take 325 mg by mouth daily., Disp: , Rfl: 3 .  furosemide (LASIX) 20 MG tablet, Take 2 tablets (40 mg total) by mouth 2 (two) times daily. Take 65m every morning and 422mevery evening (Patient taking differently: Take 40 mg by mouth 2 (two) times daily. Take 4077mvery morning and 49m70mery evening), Disp: 360 tablet, Rfl: 0 .  glucose blood test strip, Use as instructed to check blood sugar up to 4 times daily, Disp: 100 each, Rfl: 0 .  guaiFENesin (MUCINEX) 600 MG 12 hr tablet, Take 2 tablets (1,200 mg total) by mouth 2 (two) times daily., Disp: 60 tablet, Rfl: 0 .  HYDROcodone-acetaminophen (NORCO/VICODIN) 5-325 MG tablet, Take 1 tablet by mouth every 6 (six) hours as needed for moderate pain. , Disp: , Rfl: 0 .  ipratropium-albuterol (DUONEB) 0.5-2.5 (3) MG/3ML SOLN, Take 3 mLs by nebulization 4 (four) times daily. , Disp: , Rfl: 3 .  levothyroxine (SYNTHROID, LEVOTHROID) 112 MCG tablet, Take 224 mcg by mouth daily., Disp: , Rfl: 5 .  lidocaine (LIDODERM) 5 %, Place 1 patch onto the skin daily. Remove & Discard patch within 12 hours or as directed by MD, Disp: 5 patch, Rfl: 0 .  lisinopril (PRINIVIL,ZESTRIL) 5 MG tablet, Take 1 tablet (5 mg total) by mouth daily. Please call for office visit [phone]: 30 tablet, Rfl: 0 .  loperamide (IMODIUM) 2 MG capsule, Take 1 capsule (2 mg total) by mouth 4 (four) times daily as needed for diarrhea or loose stools., Disp: 12 capsule, Rfl: 0 .  metFORMIN (GLUCOPHAGE) 500 MG tablet, Take 1 tablet (500 mg total) by mouth 2 (two) times daily with a meal., Disp: 30 tablet, Rfl: 1 .  methocarbamol (ROBAXIN) 500 MG tablet, Take 1 tablet (500 mg total) by mouth every 8 (eight) hours as needed for muscle spasms., Disp: 20 tablet, Rfl: 0 .  metoCLOPramide (REGLAN) 10 MG tablet, Take 1 tablet (10 mg total) by mouth every 6 (six) hours as needed for nausea (or headache)., Disp: 30 tablet, Rfl: 0 .  metoprolol succinate  (TOPROL-XL) 25 MG 24 hr tablet, TAKE ONE TABLET BY MOUTH ONCE EVERY MORNING, Disp: , Rfl:  .  montelukast (SINGULAIR) 10 MG tablet, Take 1 tablet (10 mg total) by mouth at bedtime., Disp: 30 tablet, Rfl: 0 .  nitroGLYCERIN (NITROSTAT) 0.4 MG SL tablet, Place 1 tablet (0.4 mg total) under the tongue every 5 (five) minutes as needed for chest pain., Disp: 12 tablet, Rfl: 0 .  nystatin cream (MYCOSTATIN), Apply 1 application topically 2 (two) times daily as needed for dry skin. , Disp: , Rfl: 1 .  omeprazole (PRILOSEC) 40 MG capsule, Take 40 mg by mouth daily., Disp: , Rfl: 2 .  OXYGEN, Inhale 2 L into the lungs at bedtime., Disp: , Rfl:  .  potassium chloride SA (K-DUR,KLOR-CON) 20 MEQ tablet, Take 1 tablet (20 mEq total) by mouth 2 (two) times daily., Disp: 180 tablet, Rfl: 0 .  pravastatin (PRAVACHOL) 40 MG tablet, Take 40 mg by mouth every evening. ,  Disp: , Rfl: 2 .  QC STOOL SOFTENER PLS LAXATIVE 8.6-50 MG tablet, Take 1 tablet by mouth at bedtime., Disp: , Rfl: 2 .  tiotropium (SPIRIVA) 18 MCG inhalation capsule, Place 18 mcg into inhaler and inhale daily., Disp: , Rfl:  Allergies  Allergen Reactions  . Bee Venom Swelling and Other (See Comments)    "All over my body" (swelling)  . Fioricet [Butalbital-Apap-Caffeine] Nausea And Vomiting and Rash  . Ibuprofen Rash and Other (See Comments)    Severe rash  . Lamisil [Terbinafine] Rash and Other (See Comments)    Pt states this causes her to "feel funny"  . Nsaids Other (See Comments)    Per MD's orders      Social History   Socioeconomic History  . Marital status: Single    Spouse name: Not on file  . Number of children: Not on file  . Years of education: Not on file  . Highest education level: Not on file  Occupational History  . Occupation: unemployeed  Social Needs  . Financial resource strain: Not on file  . Food insecurity    Worry: Not on file    Inability: Not on file  . Transportation needs    Medical: Not on file     Non-medical: Not on file  Tobacco Use  . Smoking status: Former Smoker    Packs/day: 0.50    Years: 41.00    Pack years: 20.50    Types: Cigarettes    Quit date: 07/2016    Years since quitting: 2.4  . Smokeless tobacco: Never Used  . Tobacco comment: Starting to Wean Off -- using Nicotine Patch  Substance and Sexual Activity  . Alcohol use: Not Currently    Alcohol/week: 0.0 standard drinks    Comment: ? none now  . Drug use: Never    Comment: none for long time  . Sexual activity: Yes  Lifestyle  . Physical activity    Days per week: Not on file    Minutes per session: Not on file  . Stress: Not on file  Relationships  . Social Herbalist on phone: Not on file    Gets together: Not on file    Attends religious service: Not on file    Active member of club or organization: Not on file    Attends meetings of clubs or organizations: Not on file    Relationship status: Not on file  . Intimate partner violence    Fear of current or ex partner: Not on file    Emotionally abused: Not on file    Physically abused: Not on file    Forced sexual activity: Not on file  Other Topics Concern  . Not on file  Social History Narrative  . Not on file    Physical Exam Pulmonary:     Effort: Pulmonary effort is normal. No respiratory distress.     Breath sounds: No wheezing or rales.  Musculoskeletal:        General: No swelling.     Right lower leg: No edema.     Left lower leg: No edema.  Skin:    General: Skin is warm and dry.         Future Appointments  Date Time Provider [place]  02/11/2019  9:30 AM Marzetta Board, DPM [facility]     BP 120/84 (BP Location: Right Arm, Patient Position: Sitting, Cuff Size: Large)   Pulse 100   Temp  98.6 F (37 C)   SpO2 99%   ATF pt CAO x4 waiting for the maintenance man to fix her stove.  Shes walking around the house cleaning. She said that she feels good; she said that she's anxious  because of waiting.  She has taken all of her medications.  She went to [facility] on [address] yesterday and got a "complete physical". She has chosen that office for her pcp.  She denies sob, chest pain and dizziness.  rx bottles verified. She still hasn't gotten batteries for her scale, she said that she gained one lb according to the weight she got yesterday.    Medication ordered: none Marcella Dunnaway, EMT Paramedic [phone] 12/13/2018    ACTION: Home visit completed

## 2018-12-18 IMAGING — US US THYROID BIOPSY
1 series · 13 of 21 positions shown · non-contrast
Comparison: US Thyroid 01/24/16

MEDICATIONS:
8 cc 1% lidocaine

COMPLICATIONS:
None immediate.

INDICATION: Right thyroid mid nodule 3.9 cm

Right thyroid superior nodule 1.5 cm
EXAM:
ULTRASOUND GUIDED FINE NEEDLE ASPIRATION OF INDETERMINATE THYROID
NODULE
TECHNIQUE: Informed written consent was obtained from the patient after a
discussion of the risks, benefits and alternatives to treatment.
Questions regarding the procedure were encouraged and answered. A
timeout was performed prior to the initiation of the procedure.

[Series 1: us thyroid biopsy · 0.09mm/px · 21 acquisitions, 13 frames shown]
[im 1/21]
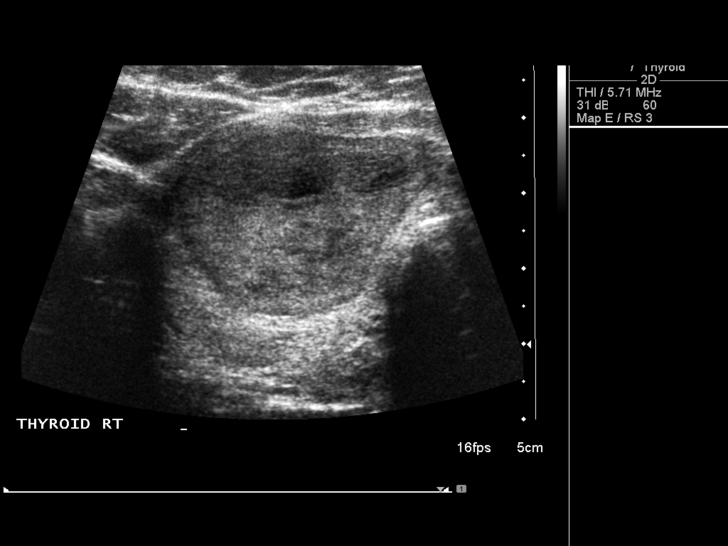
[im 3/21]
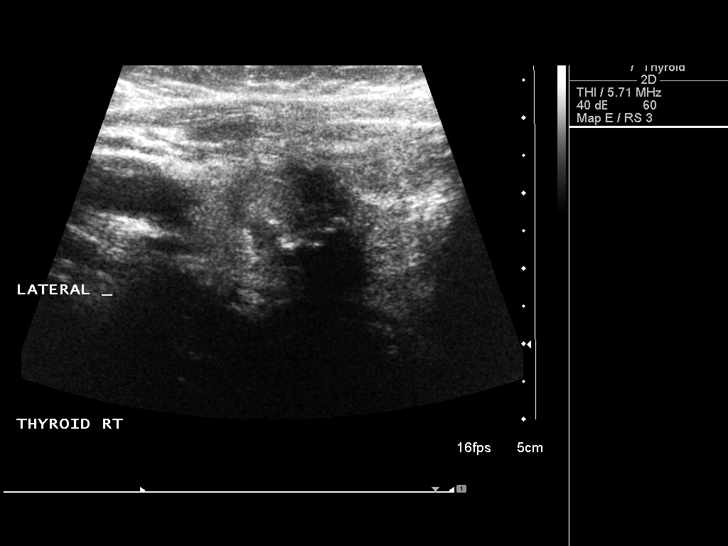
[im 5/21]
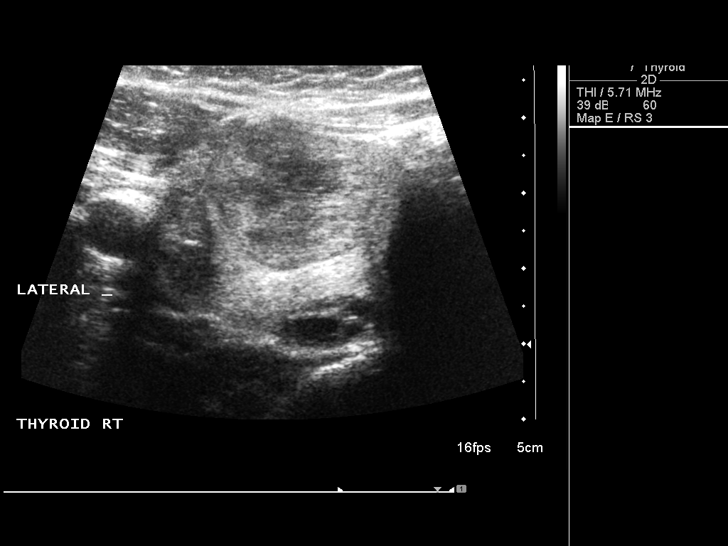
[im 6/21]
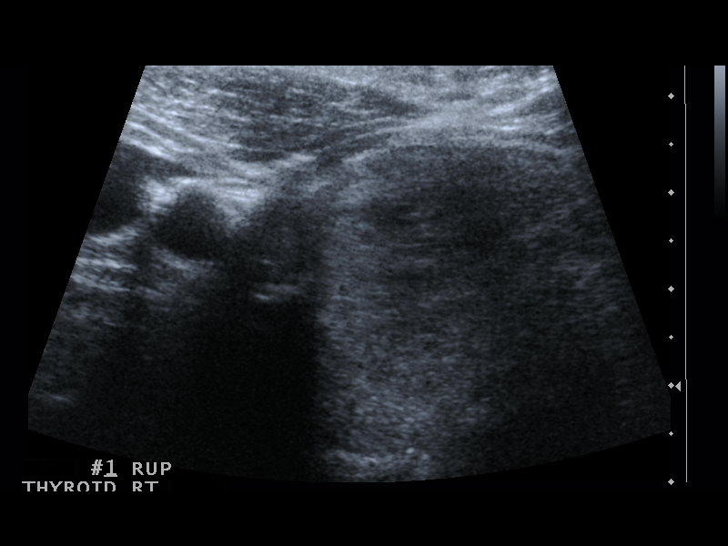
[im 8/21]
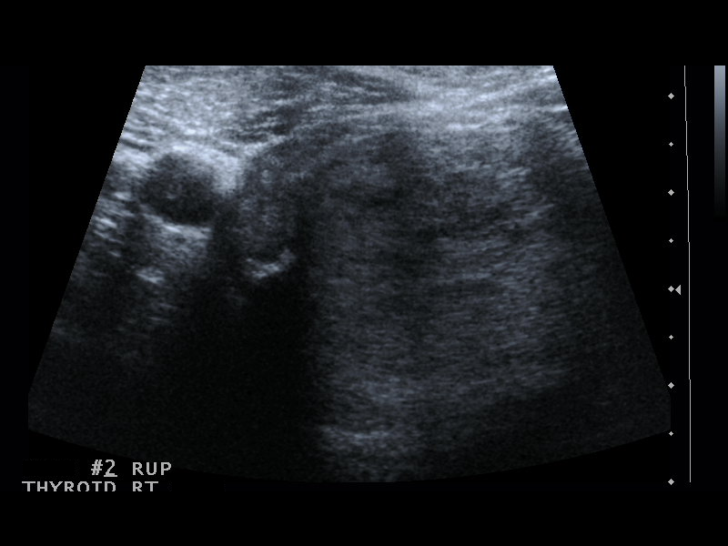
[im 9/21]
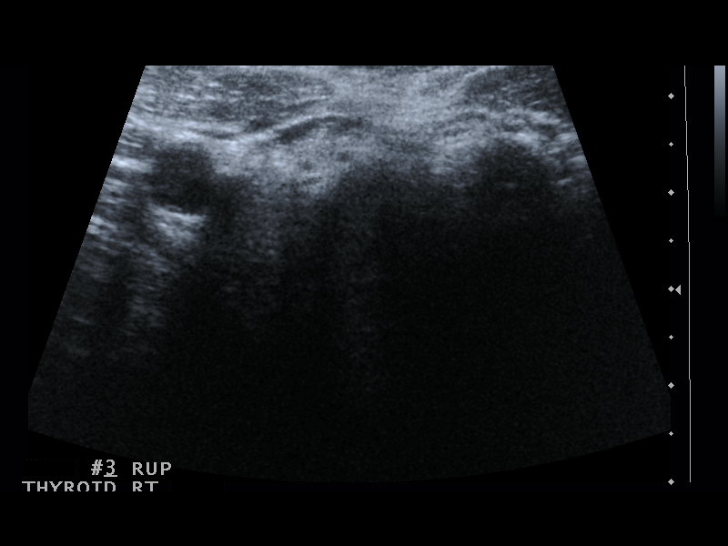
[im 11/21]
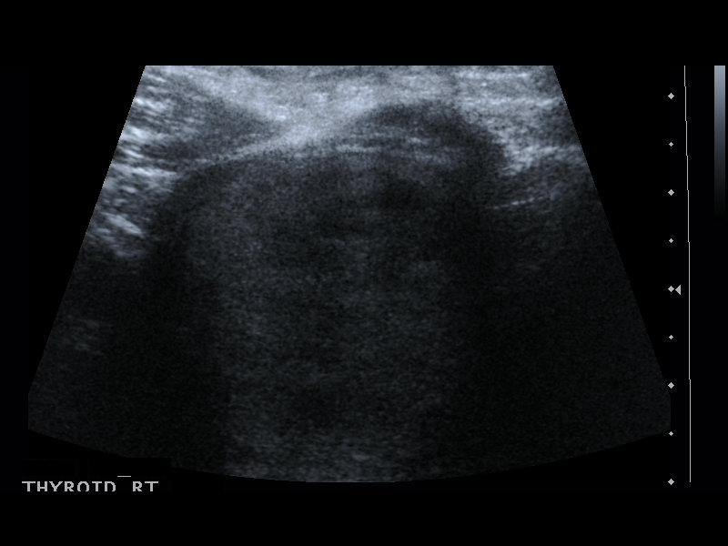
[im 13/21]
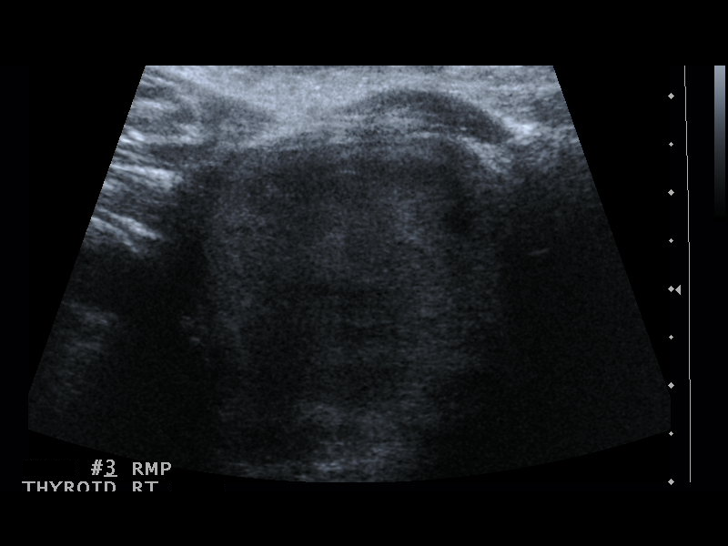
[im 14/21]
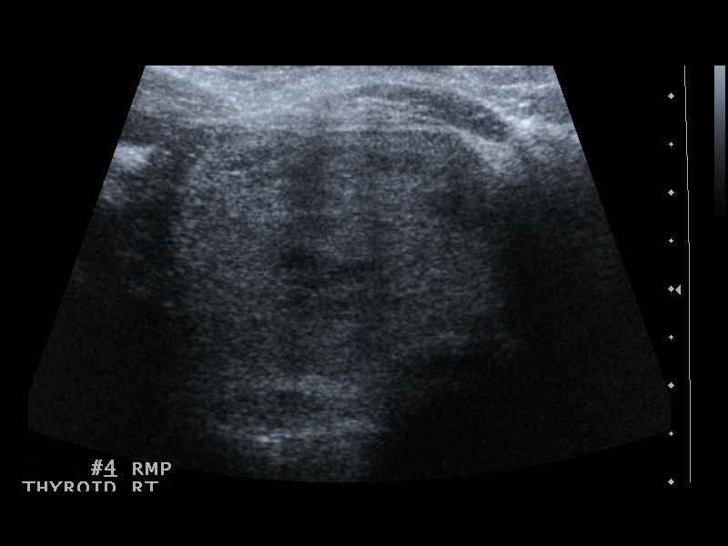
[im 16/21]
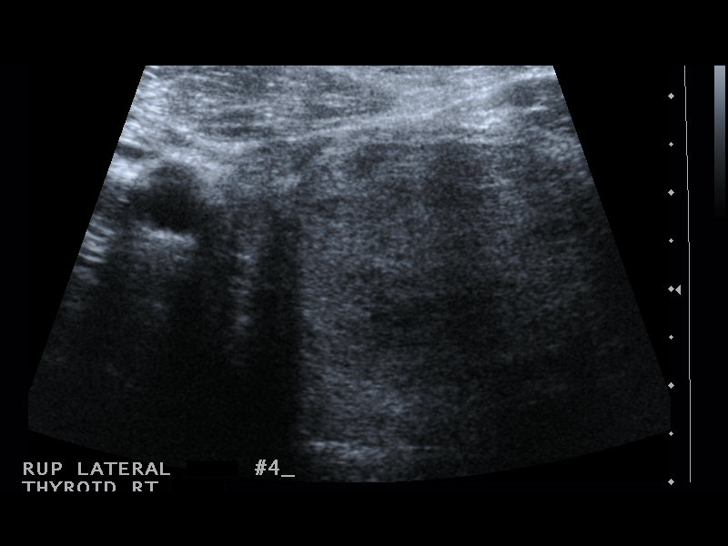
[im 17/21]
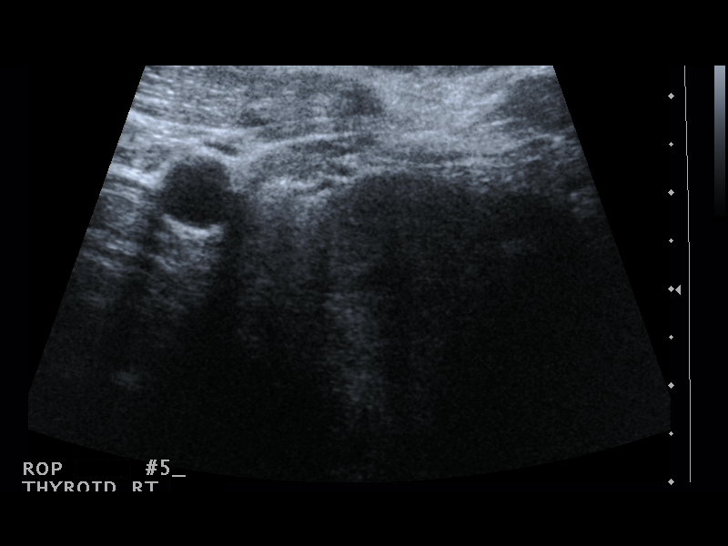
[im 19/21]
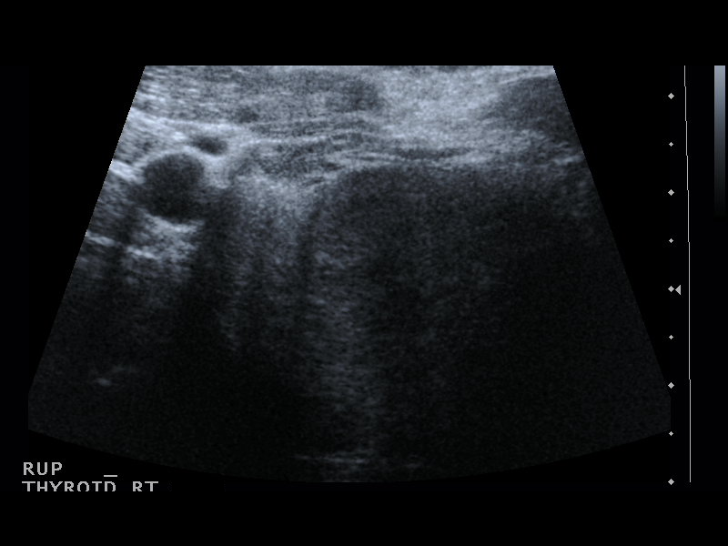
[im 21/21]
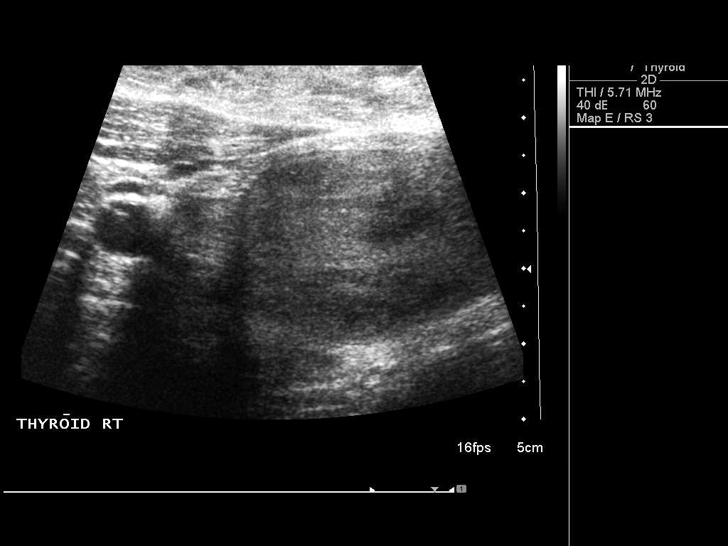

[13 of 21 positions shown; findings below may reference images not displayed]

Pre-procedural ultrasound scanning demonstrated unchanged size and
appearance of the indeterminate nodules within the right thyroid

The procedure was planned. The neck was prepped in the usual sterile
fashion, and a sterile drape was applied covering the operative
field. A timeout was performed prior to the initiation of the
procedure. Local anesthesia was provided with 1% lidocaine.

Under direct ultrasound guidance, 3 FNA biopsies were performed of
the right mid nodule with a 25 gauge needle.

2 additional samples were taken and sent for AFIRMA

Multiple ultrasound images were saved for procedural documentation
purposes. The samples were prepared and submitted to pathology.

Under direct ultrasound guidance, 3 FNA biopsies were performed of
the right superior nodule with a 25 gauge needle.

2 additional samples were taken and sent for AFIRMA

Multiple ultrasound images were saved for procedural documentation
purposes. The samples were prepared and submitted to pathology.

Limited post procedural scanning was negative for hematoma or
additional complication. Dressings were placed. The patient
tolerated the above procedures procedure well without immediate
postprocedural complication.
FINDINGS: FINDINGS
Nodule reference number based on prior diagnostic ultrasound: 1

Maximum size:

Location: Right  ;  Mid

ACR TI-RADS total points: 4

ACR TI-RADS risk category:   TR4 (4-6 points)

Prior biopsy:  No

Reason for biopsy: meets ACR TI-RADS criteria

_________________________________________________________

Nodule reference number based on prior diagnostic ultrasound: 3

Maximum size:  1.5 cm

Location: Right  ;  Superior

ACR TI-RADS total points: 7

ACR TI-RADS risk category:   TR5 (>/= 7 points)

Prior biopsy:  No

Reason for biopsy:  Meets ACR TI-RADS criteria

Ultrasound imaging confirms appropriate placement of the needles
within the thyroid nodule.
IMPRESSION: 1. Technically successful ultrasound guided fine needle aspiration
of right mid thyroid nodule
2. Technically successful ultrasound guided fine needle aspiration
of right superior thyroid nodule
Read by

Kiri Jim

## 2019-01-01 ENCOUNTER — Emergency Department (HOSPITAL_COMMUNITY): Payer: Medicaid Other

## 2019-01-01 ENCOUNTER — Other Ambulatory Visit: Payer: Self-pay

## 2019-01-01 ENCOUNTER — Emergency Department (HOSPITAL_COMMUNITY)
Admission: EM | Admit: 2019-01-01 | Discharge: 2019-01-02 | Disposition: A | Discharge status: Home / Self Care | Payer: Medicaid Other | Attending: Emergency Medicine | Admitting: Emergency Medicine

## 2019-01-01 ENCOUNTER — Encounter (HOSPITAL_COMMUNITY): Payer: Self-pay

## 2019-01-01 DIAGNOSIS — E876 Hypokalemia: Secondary | ICD-10-CM | POA: Insufficient documentation

## 2019-01-01 DIAGNOSIS — Z7984 Long term (current) use of oral hypoglycemic drugs: Secondary | ICD-10-CM | POA: Insufficient documentation

## 2019-01-01 DIAGNOSIS — R2243 Localized swelling, mass and lump, lower limb, bilateral: Secondary | ICD-10-CM | POA: Diagnosis not present

## 2019-01-01 DIAGNOSIS — I110 Hypertensive heart disease with heart failure: Secondary | ICD-10-CM | POA: Diagnosis not present

## 2019-01-01 DIAGNOSIS — M7989 Other specified soft tissue disorders: Secondary | ICD-10-CM

## 2019-01-01 DIAGNOSIS — J441 Chronic obstructive pulmonary disease with (acute) exacerbation: Secondary | ICD-10-CM | POA: Diagnosis not present

## 2019-01-01 DIAGNOSIS — I5032 Chronic diastolic (congestive) heart failure: Secondary | ICD-10-CM | POA: Diagnosis not present

## 2019-01-01 DIAGNOSIS — E119 Type 2 diabetes mellitus without complications: Secondary | ICD-10-CM | POA: Diagnosis not present

## 2019-01-01 DIAGNOSIS — Z79899 Other long term (current) drug therapy: Secondary | ICD-10-CM | POA: Diagnosis not present

## 2019-01-01 DIAGNOSIS — Z87891 Personal history of nicotine dependence: Secondary | ICD-10-CM | POA: Insufficient documentation

## 2019-01-01 DIAGNOSIS — R05 Cough: Secondary | ICD-10-CM | POA: Diagnosis present

## 2019-01-01 LAB — BASIC METABOLIC PANEL
Anion gap: 12 (ref 5–15)
BUN: 13 mg/dL (ref 6–20)
CO2: 29 mmol/L (ref 22–32)
Calcium: 8.8 mg/dL — ABNORMAL LOW (ref 8.9–10.3)
Chloride: 96 mmol/L — ABNORMAL LOW (ref 98–111)
Creatinine, Ser: 0.73 mg/dL (ref 0.44–1.00)
GFR calc Af Amer: >60 mL/min (ref >60)
GFR calc non Af Amer: >60 mL/min (ref >60)
Glucose, Bld: 114 mg/dL — ABNORMAL HIGH (ref 70–99)
Potassium: 3.4 mmol/L — ABNORMAL LOW (ref 3.5–5.1)
Sodium: 137 mmol/L (ref 135–145)

## 2019-01-01 LAB — TROPONIN I (HIGH SENSITIVITY)
Troponin I (High Sensitivity): 5 ng/L (ref <18)
Troponin I (High Sensitivity): 4 ng/L (ref <18)

## 2019-01-01 LAB — CBC
HCT: 41.2 % (ref 36.0–46.0)
Hemoglobin: 13.1 g/dL (ref 12.0–15.0)
MCH: 29.8 pg (ref 26.0–34.0)
MCHC: 31.8 g/dL (ref 30.0–36.0)
MCV: 93.8 fL (ref 80.0–100.0)
Platelets: 316 10*3/uL (ref 150–400)
RBC: 4.39 MIL/uL (ref 3.87–5.11)
RDW: 12.9 % (ref 11.5–15.5)
WBC: 16.6 10*3/uL — ABNORMAL HIGH (ref 4.0–10.5)
nRBC: 0 % (ref 0.0–0.2)

## 2019-01-01 MED ORDER — SODIUM CHLORIDE 0.9% FLUSH
3.0000 mL | Freq: Once | INTRAVENOUS | Status: AC
  Administered 2019-01-02: 3 mL via INTRAVENOUS

## 2019-01-01 NOTE — ED Triage Notes (Addendum)
Patient arrived by [facility] for increased edema reported in abdomen and legs. States that she thinks related to her CHF. Patient alert and oriented, speaking complete sentences. States that her legs feel tight and taking her diuretic as prescribed. Denies CP. patinet on oxygen at home 2l all the time

## 2019-01-02 LAB — CBG MONITORING, ED: Glucose-Capillary: 121 mg/dL — ABNORMAL HIGH (ref 70–99)

## 2019-01-02 MED ORDER — FUROSEMIDE 10 MG/ML IJ SOLN
120.0000 mg | Freq: Once | INTRAVENOUS | Status: AC
  Administered 2019-01-02: 120 mg via INTRAVENOUS
  Filled 2019-01-02: qty 12

## 2019-01-02 MED ORDER — ALBUTEROL SULFATE HFA 108 (90 BASE) MCG/ACT IN AERS
8.0000 | INHALATION_SPRAY | Freq: Once | RESPIRATORY_TRACT | Status: AC
  Administered 2019-01-02: 8 via RESPIRATORY_TRACT
  Filled 2019-01-02: qty 6.7

## 2019-01-02 MED ORDER — POTASSIUM CHLORIDE CRYS ER 20 MEQ PO TBCR
40.0000 meq | EXTENDED_RELEASE_TABLET | Freq: Once | ORAL | Status: AC
  Administered 2019-01-02: 05:00:00 40 meq via ORAL
  Filled 2019-01-02: qty 2

## 2019-01-02 MED ORDER — HYDROCODONE-ACETAMINOPHEN 5-325 MG PO TABS
2.0000 | ORAL_TABLET | Freq: Once | ORAL | Status: AC
  Administered 2019-01-02: 05:00:00 2 via ORAL
  Filled 2019-01-02: qty 2

## 2019-01-02 NOTE — ED Provider Notes (Signed)
[facility] EMERGENCY DEPARTMENT Provider Note   CSN: [id number] Arrival date & time: 01/01/19  1801     History   Chief Complaint Chief Complaint  Patient presents with  . Edema/ CHF    HPI Michele Owens is a 58 y.o. female who presents with multiple complaints. PMH significant for HTN, dCHF EF 60-65%, COPD on 2 L of O2, DM, GERD, hx of DVT, hx of thyroid cancer in remission, morbid obesity, chronic pain. She is somewhat of a poor historian. She states that she is here because she has had a cough productive of brown sputum for "awhile" as well as increasing SOB with exertion and leg swelling. The swelling has been going on for a couple of days. She also reports some low back pain, bilateral leg pain and cramping, and intermittent chest pain. The chest pain comes and goes and is over the sternum. It's worse with palpation. She doesn't have any chest pain now. She has not smoked in 4 years. She has been taking her meds as prescribed. She has hx of right/left heart catheterization in 2017 showing no significant CAD. Last echo in April 2019 with LVEF of 60-65%. No fever, chills, abdominal pain, N/V.   HPI  Past Medical History:  Diagnosis Date  . Anxiety   . Arthritis   . Asthma   . Chronic diastolic CHF (congestive heart failure) ([place])   . COPD (chronic obstructive pulmonary disease) (HCC)    Uses Oxygen at night  . Depression   . Diabetes mellitus without complication ([place])   . Dyspnea    occ  . Family history of thyroid cancer   . GERD (gastroesophageal reflux disease)   . Gout   . Headache    migraines  . History of DVT of lower extremity   . History of nuclear stress test    Myoview 2/17:  Low risk stress nuclear study with a small, moderate intensity, partially reversible inferior lateral defect consistent with small prior infarct and minimal peri-infarct ischemia; EF 68 with normal wall motion  . Hypertension   . Pneumonia   . Thyroid cancer ([place])  09/23/2016    Patient Active Problem List   Diagnosis Date Noted  . Morbid obesity with BMI of 50.0-59.9, adult ([place])   . Fall at home, initial encounter 06/23/2018  . HNP (herniated nucleus pulposus), thoracic 06/23/2018  . CHF (congestive heart failure) ([place]) 06/18/2018  . Hypertensive disorder 08/28/2017  . Obesity 08/28/2017  . COPD exacerbation ([place]) 08/04/2017  . Acute and chronic respiratory failure with hypercapnia ([place])   . Pulmonary HTN ([place])   . Genetic testing 02/27/2017  . Family history of thyroid cancer   . Hypomagnesemia 10/03/2016  . Atypical chest pain   . HLD (hyperlipidemia) 09/23/2016  . Gout 09/23/2016  . DVT (deep vein thrombosis) in pregnancy 09/23/2016  . Thyroid cancer ([place]) 09/23/2016  . Hypocalcemia 09/23/2016  . AKI (acute kidney injury) ([place]) 09/23/2016  . Acute pulmonary edema (HCC)   . Acute hypoxemic respiratory failure ([place])   . Ventilator dependent ([place])   . Respiratory failure ([place]) 07/16/2016  . CAP (community acquired pneumonia) 01/23/2016  . 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 Pulmonary hypertension ([place]) 01/23/2016  . Obesity hypoventilation syndrome ([place]) 08/26/2015  . Dyslipidemia associated with type 2 diabetes mellitus ([place]) 04/24/2015  . Leukocytosis 04/24/2015  . Controlled type 2 diabetes mellitus without complication, without long-term current use of insulin ([place]) 04/24/2015  . Anxiety 04/24/2015  .  Benign essential HTN 04/24/2015  . Normocytic anemia 04/24/2015  . OSA (obstructive sleep apnea) 05/10/2012  . Tobacco abuse 07/04/2011    Past Surgical History:  Procedure Laterality Date  . BUNIONECTOMY Bilateral   . CARDIAC CATHETERIZATION N/A 06/23/2015   Procedure: Right/Left Heart Cath and Coronary Angiography;  Surgeon: [name], MD;  Location: [facility];  Service: Cardiovascular;  Laterality: N/A;  . COLONOSCOPY WITH PROPOFOL N/A 12/15/2015   Procedure: COLONOSCOPY WITH  PROPOFOL;  Surgeon: [name], MD;  Location: [facility];  Service: Endoscopy;  Laterality: N/A;  . THYROIDECTOMY  09/19/2016  . THYROIDECTOMY N/A 09/19/2016   Procedure: TOTAL THYROIDECTOMY;  Surgeon: [name], MD;  Location: [facility];  Service: General;  Laterality: N/A;  . TONSILLECTOMY    . TOTAL ABDOMINAL HYSTERECTOMY  07/14/10     OB History   No obstetric history on file.      Home Medications    Prior to Admission medications   Medication Sig Start Date End Date Taking? Authorizing Provider  albuterol (PROVENTIL HFA;VENTOLIN HFA) 108 (90 Base) MCG/ACT inhaler Inhale 1-2 puffs into the lungs every 6 (six) hours as needed for wheezing or shortness of breath. 04/23/18   Long, [name], MD  allopurinol (ZYLOPRIM) 100 MG tablet Take 100 mg by mouth daily.    [provider]  ANORO ELLIPTA 62.5-25 MCG/INH AEPB Inhale 1 puff into the lungs daily.  04/11/18   [provider]  Blood Glucose Monitoring Suppl (ACCU-CHEK AVIVA PLUS) w/Device KIT USE AS DIRECTED TO CHECK BLOOD SUGAR 08/07/17   [provider]  butalbital-acetaminophen-caffeine (FIORICET, ESGIC) 50-325-40 MG tablet Take 1-2 tablets by mouth every 4 (four) hours as needed for migraine. 06/13/18   [provider]  calcitRIOL (ROCALTROL) 0.5 MCG capsule Take 1 capsule (0.5 mcg total) by mouth daily. 09/26/16   Hongalgi, [name], MD  carvedilol (COREG) 6.25 MG tablet Take 1 tablet (6.25 mg total) by mouth 2 (two) times daily with a meal. 01/23/18   Bensimhon, [name], MD  cetirizine (ZYRTEC) 10 MG tablet Take 10 mg by mouth daily. 07/10/17   [provider]  clonazePAM (KLONOPIN) 2 MG tablet Take 2 mg by mouth 3 (three) times daily. for anxiety 05/08/17   [provider]  diclofenac sodium (VOLTAREN) 1 % GEL Apply 2 g topically 4 (four) times daily as needed (for pain). 06/19/18   [name], MD  EPINEPHrine 0.3 mg/0.3 mL IJ SOAJ injection Inject 0.3 mg into the muscle daily as  needed (allergic reaction).    [provider]  FEROSUL 325 (65 Fe) MG tablet Take 325 mg by mouth daily. 03/22/17   [provider]  furosemide (LASIX) 20 MG tablet Take 2 tablets (40 mg total) by mouth 2 (two) times daily. Take 63m every morning and 433mevery evening Patient taking differently: Take 40 mg by mouth 2 (two) times daily. Take 4024mvery morning and 65m82mery evening 07/24/18   McLeLarey Dresser  glucose blood test strip Use as instructed to check blood sugar up to 4 times daily 08/06/17   ChoiDessa Phi  guaiFENesin (MUCINEX) 600 MG 12 hr tablet Take 2 tablets (1,200 mg total) by mouth 2 (two) times daily. 07/11/18   Xu, Florencia Reasons  HYDROcodone-acetaminophen (NORCO/VICODIN) 5-325 MG tablet Take 1 tablet by mouth every 6 (six) hours as needed for moderate pain.  02/12/18   [provider]  ipratropium-albuterol (DUONEB) 0.5-2.5 (3) MG/3ML SOLN Take 3  mLs by nebulization 4 (four) times daily.  04/05/17   [provider]  levothyroxine (SYNTHROID, LEVOTHROID) 112 MCG tablet Take 224 mcg by mouth daily. 07/11/17   [provider]  lidocaine (LIDODERM) 5 % Place 1 patch onto the skin daily. Remove & Discard patch within 12 hours or as directed by MD 07/11/18   Florencia Reasons, MD  lisinopril (PRINIVIL,ZESTRIL) 5 MG tablet Take 1 tablet (5 mg total) by mouth daily. Please call for office visit [phone] 08/15/17   [name], MD  loperamide (IMODIUM) 2 MG capsule Take 1 capsule (2 mg total) by mouth 4 (four) times daily as needed for diarrhea or loose stools. 08/18/23   [name], MD  metFORMIN (GLUCOPHAGE) 500 MG tablet Take 1 tablet (500 mg total) by mouth 2 (two) times daily with a meal. 12/11/14   [name], MD  methocarbamol (ROBAXIN) 500 MG tablet Take 1 tablet (500 mg total) by mouth every 8 (eight) hours as needed for muscle spasms. 06/19/18   [name], MD  metoCLOPramide (REGLAN) 10 MG tablet Take 1 tablet (10 mg total) by  mouth every 6 (six) hours as needed for nausea (or headache). 8/52/77   [name], MD  metoprolol succinate (TOPROL-XL) 25 MG 24 hr tablet TAKE ONE TABLET BY MOUTH ONCE EVERY MORNING 08/09/18   [provider]  montelukast (SINGULAIR) 10 MG tablet Take 1 tablet (10 mg total) by mouth at bedtime. 07/11/18   Florencia Reasons, MD  nitroGLYCERIN (NITROSTAT) 0.4 MG SL tablet Place 1 tablet (0.4 mg total) under the tongue every 5 (five) minutes as needed for chest pain. 06/19/18   [name], MD  nystatin cream (MYCOSTATIN) Apply 1 application topically 2 (two) times daily as needed for dry skin.  04/13/17   [provider]  omeprazole (PRILOSEC) 40 MG capsule Take 40 mg by mouth daily. 04/13/15   [provider]  OXYGEN Inhale 2 L into the lungs at bedtime.    [provider]  potassium chloride SA (K-DUR,KLOR-CON) 20 MEQ tablet Take 1 tablet (20 mEq total) by mouth 2 (two) times daily. 07/24/18   [name], MD  pravastatin (PRAVACHOL) 40 MG tablet Take 40 mg by mouth every evening.  03/29/15   [provider]  QC STOOL SOFTENER PLS LAXATIVE 8.6-50 MG tablet Take 1 tablet by mouth at bedtime. 04/11/17   [provider]  tiotropium (SPIRIVA) 18 MCG inhalation capsule Place 18 mcg into inhaler and inhale daily.    [provider]    Family History Family History  Problem Relation Age of Onset  . Cancer Father        thought to be due to exposure to concrete  . Diabetes Mother   . Thyroid cancer Mother        dx in her 5s-60s  . Cancer Maternal Uncle        2 uncles with cancer NOS  . Brain cancer Paternal Aunt   . Cancer Cousin        maternal first cousin - NOS  . Cancer Cousin        maternal first cousin - NOS    Social History Social History   Tobacco Use  . Smoking status: Former Smoker    Packs/day: 0.50    Years: 41.00    Pack years: 20.50    Types: Cigarettes    Quit date: 07/2016    Years since quitting: 2.4  .  Smokeless  tobacco: Never Used  . Tobacco comment: Starting to Wean Off -- using Nicotine Patch  Substance Use Topics  . Alcohol use: Not Currently    Alcohol/week: 0.0 standard drinks    Comment: ? none now  . Drug use: Never    Comment: none for long time     Allergies   Bee venom, Fioricet [butalbital-apap-caffeine], Ibuprofen, Lamisil [terbinafine], and Nsaids   Review of Systems Review of Systems  Constitutional: Negative for chills and fever.  Respiratory: Positive for cough, shortness of breath and wheezing.   Cardiovascular: Positive for chest pain and leg swelling. Negative for palpitations.  Gastrointestinal: Negative for anal bleeding, nausea and vomiting.  Musculoskeletal: Positive for back pain.  Allergic/Immunologic: Positive for immunocompromised state.  Neurological: Negative for syncope and headaches.  All other systems reviewed and are negative.    Physical Exam Updated Vital Signs BP 129/73 (BP Location: Right Wrist)   Pulse 76   Temp 98.3 F (36.8 C) (Oral)   Resp (!) 22   SpO2 95%   Physical Exam Vitals signs and nursing note reviewed.  Constitutional:      General: She is not in acute distress.    Appearance: She is well-developed. She is obese. She is not ill-appearing.     Comments: Calm, cooperative. On 2L via [place]  HENT:     Head: Normocephalic and atraumatic.  Eyes:     General: No scleral icterus.       Right eye: No discharge.        Left eye: No discharge.     Conjunctiva/sclera: Conjunctivae normal.     Pupils: Pupils are equal, round, and reactive to light.  Neck:     Musculoskeletal: Normal range of motion.  Cardiovascular:     Rate and Rhythm: Normal rate and regular rhythm.  Pulmonary:     Effort: Pulmonary effort is normal. No respiratory distress.     Breath sounds: Wheezing (mild scattered expiratory wheezes) present.  Abdominal:     General: There is no distension.  Musculoskeletal:     Right lower leg: Edema present.      Left lower leg: Edema present.     Comments: Bi alteral pitting edema to the thighs  Skin:    General: Skin is warm and dry.  Neurological:     Mental Status: She is alert and oriented to person, place, and time.  Psychiatric:        Behavior: Behavior normal.      ED Treatments / Results  Labs (all labs ordered are listed, but only abnormal results are displayed) Labs Reviewed  BASIC METABOLIC PANEL - Abnormal; Notable for the following components:      Result Value   Potassium 3.4 (*)    Chloride 96 (*)    Glucose, Bld 114 (*)    Calcium 8.8 (*)    All other components within normal limits  CBC - Abnormal; Notable for the following components:   WBC 16.6 (*)    All other components within normal limits  CBG MONITORING, ED - Abnormal; Notable for the following components:   Glucose-Capillary 121 (*)    All other components within normal limits  TROPONIN I (HIGH SENSITIVITY)  TROPONIN I (HIGH SENSITIVITY)    EKG EKG Interpretation  Date/Time:  Wednesday January 01 2019 18:07:39 EDT Ventricular Rate:  82 PR Interval:  166 QRS Duration: 78 QT Interval:  384 QTC Calculation: 448 R Axis:   31 Text Interpretation:  Normal sinus rhythm  Cannot rule out Anterior infarct , age undetermined Abnormal ECG When compared with ECG of EARLIER SAME DATE No significant change was found Confirmed by [name] ([id number]) on 01/01/2019 11:48:01 PM   Radiology Dg Chest 2 View  Result Date: 01/01/2019 CLINICAL DATA:  Patient arrived by [facility] for increased edema reported in abdomen and legs. States that she thinks related to her CHF. States that her legs feel tight and taking her diuretic as prescribed. Denies CP. patinet on oxygen at home 2l all the time EXAM: CHEST - 2 VIEW COMPARISON:  Chest radiograph 07/09/2018, 07/08/2018 FINDINGS: Poor penetration likely due to patient body habitus. Cardiomediastinal contours appear stable. There are scattered bibasilar linear opacities similar to  prior likely reflecting atelectasis or scarring. No definite new focal infiltrate or evidence of edema. No pneumothorax or pleural effusion. No acute finding is seen in the visualized skeleton. IMPRESSION: Probable bibasilar scarring or atelectasis. No definite new focal infiltrate or edema. Electronically Signed   By: Audie Pinto M.D.   On: 01/01/2019 19:00    Procedures Procedures (including critical care time)  Medications Ordered in ED Medications  furosemide (LASIX) 120 mg in dextrose 5 % 50 mL IVPB (120 mg Intravenous New Bag/Given 01/02/19 0555)  sodium chloride flush (NS) 0.9 % injection 3 mL (3 mLs Intravenous Given 01/02/19 0532)  HYDROcodone-acetaminophen (NORCO/VICODIN) 5-325 MG per tablet 2 tablet (2 tablets Oral Given 01/02/19 0457)  albuterol (VENTOLIN HFA) 108 (90 Base) MCG/ACT inhaler 8 puff (8 puffs Inhalation Given 01/02/19 0457)  potassium chloride SA (K-DUR) CR tablet 40 mEq (40 mEq Oral Given 01/02/19 0529)     Initial Impression / Assessment and Plan / ED Course  I have reviewed the triage vital signs and the nursing notes.  Pertinent labs & imaging results that were available during my care of the patient were reviewed by me and considered in my medical decision making (see chart for details).  57 year old female presents with leg swelling and SOB and various other non-emergent complaints. She is asking for her thyroid medicine, a flu shot, shingles shot. Her main issue is SOB and leg swelling however. She does have some mild wheezing on exam and has a cough productive of brown sputum that has been going on "awhile". Vitals are normal. She does not have any increased O2 requirements today. Legs do have pitting edema bilaterally. EKG is SR. She has been waiting for almost 10 hours in the waiting room at this point. Her CXR is negative. Labs show mild hypokalemia and hypocalcemia. She has a chronic leukocytosis. Trops are normal.   She has hx of normal BNPs even with CHF  exacerbations therefore do not see utility in adding this on. Will treat symptomatically with albuterol and lasix as symptoms are likely multifactorial from her COPD, CHF, and obesity.  6:21 AM There was some delay in giving Lasix due to difficult IV. Medicine is now infusing.  Care signed out to E Phylliss Bob PA-C who will reassess pt after meds  Final Clinical Impressions(s) / ED Diagnoses   Final diagnoses:  Chronic obstructive pulmonary disease with acute exacerbation [facility])  Leg swelling  Hypokalemia    ED Discharge Orders    None       Recardo Evangelist, PA-C 01/02/19 8280    Ripley Fraise, MD 01/03/19 0140

## 2019-01-02 NOTE — ED Provider Notes (Signed)
  I assumed care of patient at shift change from [facility], please see her note for full H&P.  Briefly patient is here for evaluation of multiple complaints, her main however is shortness of breath and leg swelling.  She reportedly had mild wheezing on exam and a productive cough.  She is chronically on 2 L of oxygen at baseline.  She had pitting edema bilaterally with a chest x-ray without evidence of fluid overload.  Physical Exam  BP (!) 104/92   Pulse 78   Temp 98.3 F (36.8 C) (Oral)   Resp 16   Ht 5\' 5"  (1.651 m)   Wt 131.5 kg   SpO2 97%   BMI 48.26 kg/m   Physical Exam Vitals signs and nursing note reviewed.  Constitutional:      General: She is not in acute distress.    Appearance: She is obese.  HENT:     Head: Normocephalic.  Neck:     Musculoskeletal: Normal range of motion and neck supple.  Cardiovascular:     Rate and Rhythm: Normal rate.  Pulmonary:     Effort: Pulmonary effort is normal.  Neurological:     Mental Status: She is alert and oriented to person, place, and time.  Psychiatric:     Comments: Anxious.       ED Course/Procedures     Procedures  MDM  Plan is to follow-up on symptomatic improvement after Lasix.  She is currently getting her Lasix.  She was given p.o. potassium as she was slightly hypokalemic.  9:11 am.  I reevaluated patient after being called to her room stating that she wished to be discharged home.  She reports that she has had a large amount of urine output and is feeling better since the Lasix.  We discussed the importance of dietary compliance and avoiding high sodium foods.  She states that she has been cheating on her diet recently, has gained 10 pounds and has been eating potato chips.  We discussed the importance of maintaining compliant with her diet.  She states her understanding of this.  She knows that she needs to schedule a follow-up appointment with her doctor and says she will call them today.   We discussed  that instead of changing her Lasix and possibly disrupting her potassium, when I suspect that dietary indiscretions are the primary cause of her weight gain she elected instead to control her diet and follow-up with primary care doctor in the next week.    Return precautions were discussed with patient who states their understanding.  At the time of discharge patient denied any unaddressed complaints or concerns.  Patient is agreeable for discharge home.        Lorin Glass, PA-C 01/02/19 Jay, [place], DO 01/02/19 1544

## 2019-01-02 NOTE — ED Notes (Signed)
CBG collect per pt request stating that she is a diabetic and has been waiting in the waiting room for hours with nothing to eat or drink. PO fluids given   CBG 121

## 2019-01-02 NOTE — ED Notes (Signed)
Pt discharge paperwork reviewed with patient. Pt verbalized understanding of discharge instructions. Unable to sign, no signature pad available. Pt discharged.

## 2019-01-02 NOTE — ED Notes (Signed)
PTAR cancelled

## 2019-01-02 NOTE — ED Notes (Signed)
Attempted Iv x2 no success. Rn to ask second clinician

## 2019-01-02 NOTE — Discharge Instructions (Addendum)
It is important that you maintain a low-sodium diet.  Please continue taking all of your medications.  If your symptoms worsen, you develop fevers, or have new or concerning symptoms please seek additional medical care and evaluation.  Please call your primary care doctor today to schedule a follow-up appointment, ideally for tomorrow.

## 2019-01-02 NOTE — ED Notes (Signed)
Changed pt O2 tank 

## 2019-01-02 NOTE — ED Notes (Signed)
[facility] called for transport.  

## 2019-01-31 ENCOUNTER — Other Ambulatory Visit (HOSPITAL_COMMUNITY): Payer: Self-pay

## 2019-01-31 NOTE — Progress Notes (Signed)
Paramedicine Encounter    Patient ID: Michele Owens, female    DOB: 1960/06/28, 58 y.o.   MRN: [id number]    Patient Care Team: [facility] as PCP - Sande Rives, Susy Frizzle as Referring Physician (Physician Assistant)  Patient Active Problem List   Diagnosis Date Noted  . Morbid obesity with BMI of 50.0-59.9, adult ([place])   . Fall at home, initial encounter 06/23/2018  . HNP (herniated nucleus pulposus), thoracic 06/23/2018  . CHF (congestive heart failure) ([place]) 06/18/2018  . Hypertensive disorder 08/28/2017  . Obesity 08/28/2017  . COPD exacerbation ([place]) 08/04/2017  . Acute and chronic respiratory failure with hypercapnia ([place])   . Pulmonary HTN ([place])   . Genetic testing 02/27/2017  . Family history of thyroid cancer   . Hypomagnesemia 10/03/2016  . Atypical chest pain   . HLD (hyperlipidemia) 09/23/2016  . Gout 09/23/2016  . DVT (deep vein thrombosis) in pregnancy 09/23/2016  . Thyroid cancer ([place]) 09/23/2016  . Hypocalcemia 09/23/2016  . AKI (acute kidney injury) ([place]) 09/23/2016  . Acute pulmonary edema ([facility])   . Acute hypoxemic respiratory failure ([place])   . Ventilator dependent ([place])   . Respiratory failure ([place]) 07/16/2016  . CAP (community acquired pneumonia) 01/23/2016  . 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 Pulmonary hypertension ([place]) 01/23/2016  . Obesity hypoventilation syndrome ([place]) 08/26/2015  . Dyslipidemia associated with type 2 diabetes mellitus ([place]) 04/24/2015  . Leukocytosis 04/24/2015  . Controlled type 2 diabetes mellitus without complication, without long-term current use of insulin ([place]) 04/24/2015  . Anxiety 04/24/2015  . Benign essential HTN 04/24/2015  . Normocytic anemia 04/24/2015  . OSA (obstructive sleep apnea) 05/10/2012  . Tobacco abuse 07/04/2011    Current Outpatient Medications:  .  albuterol (PROVENTIL HFA;VENTOLIN HFA) 108 (90 Base) MCG/ACT inhaler, Inhale 1-2 puffs into the lungs  every 6 (six) hours as needed for wheezing or shortness of breath., Disp: 1 Inhaler, Rfl: 0 .  allopurinol (ZYLOPRIM) 100 MG tablet, Take 100 mg by mouth daily., Disp: , Rfl:  .  ANORO ELLIPTA 62.5-25 MCG/INH AEPB, Inhale 1 puff into the lungs daily. , Disp: , Rfl:  .  Blood Glucose Monitoring Suppl (ACCU-CHEK AVIVA PLUS) w/Device KIT, USE AS DIRECTED TO CHECK BLOOD SUGAR, Disp: , Rfl: 0 .  butalbital-acetaminophen-caffeine (FIORICET, ESGIC) 50-325-40 MG tablet, Take 1-2 tablets by mouth every 4 (four) hours as needed for migraine., Disp: , Rfl:  .  calcitRIOL (ROCALTROL) 0.5 MCG capsule, Take 1 capsule (0.5 mcg total) by mouth daily., Disp: 30 capsule, Rfl: 0 .  carvedilol (COREG) 6.25 MG tablet, Take 1 tablet (6.25 mg total) by mouth 2 (two) times daily with a meal., Disp: 60 tablet, Rfl: 0 .  cetirizine (ZYRTEC) 10 MG tablet, Take 10 mg by mouth daily., Disp: , Rfl: 2 .  clonazePAM (KLONOPIN) 2 MG tablet, Take 2 mg by mouth 3 (three) times daily. for anxiety, Disp: , Rfl: 1 .  diclofenac sodium (VOLTAREN) 1 % GEL, Apply 2 g topically 4 (four) times daily as needed (for pain)., Disp: 1 Tube, Rfl: 0 .  EPINEPHrine 0.3 mg/0.3 mL IJ SOAJ injection, Inject 0.3 mg into the muscle daily as needed (allergic reaction)., Disp: , Rfl:  .  FEROSUL 325 (65 Fe) MG tablet, Take 325 mg by mouth daily., Disp: , Rfl: 3 .  furosemide (LASIX) 20 MG tablet, Take 2 tablets (40 mg total) by mouth 2 (two) times daily. Take 43m every morning and 433mevery  evening (Patient taking differently: Take 40 mg by mouth 2 (two) times daily. Take 17m every morning and 266mevery evening), Disp: 360 tablet, Rfl: 0 .  glucose blood test strip, Use as instructed to check blood sugar up to 4 times daily, Disp: 100 each, Rfl: 0 .  guaiFENesin (MUCINEX) 600 MG 12 hr tablet, Take 2 tablets (1,200 mg total) by mouth 2 (two) times daily., Disp: 60 tablet, Rfl: 0 .  HYDROcodone-acetaminophen (NORCO/VICODIN) 5-325 MG tablet, Take 1 tablet  by mouth every 6 (six) hours as needed for moderate pain. , Disp: , Rfl: 0 .  ipratropium-albuterol (DUONEB) 0.5-2.5 (3) MG/3ML SOLN, Take 3 mLs by nebulization 4 (four) times daily. , Disp: , Rfl: 3 .  levothyroxine (SYNTHROID, LEVOTHROID) 112 MCG tablet, Take 224 mcg by mouth daily., Disp: , Rfl: 5 .  lidocaine (LIDODERM) 5 %, Place 1 patch onto the skin daily. Remove & Discard patch within 12 hours or as directed by MD, Disp: 5 patch, Rfl: 0 .  lisinopril (PRINIVIL,ZESTRIL) 5 MG tablet, Take 1 tablet (5 mg total) by mouth daily. Please call for office visit [phone]: 30 tablet, Rfl: 0 .  loperamide (IMODIUM) 2 MG capsule, Take 1 capsule (2 mg total) by mouth 4 (four) times daily as needed for diarrhea or loose stools., Disp: 12 capsule, Rfl: 0 .  metFORMIN (GLUCOPHAGE) 500 MG tablet, Take 1 tablet (500 mg total) by mouth 2 (two) times daily with a meal., Disp: 30 tablet, Rfl: 1 .  methocarbamol (ROBAXIN) 500 MG tablet, Take 1 tablet (500 mg total) by mouth every 8 (eight) hours as needed for muscle spasms., Disp: 20 tablet, Rfl: 0 .  metoCLOPramide (REGLAN) 10 MG tablet, Take 1 tablet (10 mg total) by mouth every 6 (six) hours as needed for nausea (or headache)., Disp: 30 tablet, Rfl: 0 .  metoprolol succinate (TOPROL-XL) 25 MG 24 hr tablet, TAKE ONE TABLET BY MOUTH ONCE EVERY MORNING, Disp: , Rfl:  .  montelukast (SINGULAIR) 10 MG tablet, Take 1 tablet (10 mg total) by mouth at bedtime., Disp: 30 tablet, Rfl: 0 .  nitroGLYCERIN (NITROSTAT) 0.4 MG SL tablet, Place 1 tablet (0.4 mg total) under the tongue every 5 (five) minutes as needed for chest pain., Disp: 12 tablet, Rfl: 0 .  nystatin cream (MYCOSTATIN), Apply 1 application topically 2 (two) times daily as needed for dry skin. , Disp: , Rfl: 1 .  omeprazole (PRILOSEC) 40 MG capsule, Take 40 mg by mouth daily., Disp: , Rfl: 2 .  OXYGEN, Inhale 2 L into the lungs at bedtime., Disp: , Rfl:  .  potassium chloride SA (K-DUR,KLOR-CON) 20  MEQ tablet, Take 1 tablet (20 mEq total) by mouth 2 (two) times daily., Disp: 180 tablet, Rfl: 0 .  pravastatin (PRAVACHOL) 40 MG tablet, Take 40 mg by mouth every evening. , Disp: , Rfl: 2 .  QC STOOL SOFTENER PLS LAXATIVE 8.6-50 MG tablet, Take 1 tablet by mouth at bedtime., Disp: , Rfl: 2 .  tiotropium (SPIRIVA) 18 MCG inhalation capsule, Place 18 mcg into inhaler and inhale daily., Disp: , Rfl:  Allergies  Allergen Reactions  . Bee Venom Swelling and Other (See Comments)    "All over my body" (swelling)  . Fioricet [Butalbital-Apap-Caffeine] Nausea And Vomiting and Rash  . Ibuprofen Rash and Other (See Comments)    Severe rash  . Lamisil [Terbinafine] Rash and Other (See Comments)    Pt states this causes her to "feel funny"  . Nsaids Other (  See Comments)    Per MD's orders      Social History   Socioeconomic History  . Marital status: Single    Spouse name: Not on file  . Number of children: Not on file  . Years of education: Not on file  . Highest education level: Not on file  Occupational History  . Occupation: unemployeed  Social Needs  . Financial resource strain: Not on file  . Food insecurity    Worry: Not on file    Inability: Not on file  . Transportation needs    Medical: Not on file    Non-medical: Not on file  Tobacco Use  . Smoking status: Former Smoker    Packs/day: 0.50    Years: 41.00    Pack years: 20.50    Types: Cigarettes    Quit date: 07/2016    Years since quitting: 2.5  . Smokeless tobacco: Never Used  . Tobacco comment: Starting to Wean Off -- using Nicotine Patch  Substance and Sexual Activity  . Alcohol use: Not Currently    Alcohol/week: 0.0 standard drinks    Comment: ? none now  . Drug use: Never    Comment: none for long time  . Sexual activity: Yes  Lifestyle  . Physical activity    Days per week: Not on file    Minutes per session: Not on file  . Stress: Not on file  Relationships  . Social Herbalist on phone:  Not on file    Gets together: Not on file    Attends religious service: Not on file    Active member of club or organization: Not on file    Attends meetings of clubs or organizations: Not on file    Relationship status: Not on file  . Intimate partner violence    Fear of current or ex partner: Not on file    Emotionally abused: Not on file    Physically abused: Not on file    Forced sexual activity: Not on file  Other Topics Concern  . Not on file  Social History Narrative  . Not on file    Physical Exam Pulmonary:     Effort: No respiratory distress.     Breath sounds: No wheezing or rales.  Musculoskeletal:        General: No swelling.  Skin:    General: Skin is warm and dry.         Future Appointments  Date Time Provider [place]  02/11/2019  9:30 AM Marzetta Board, DPM [facility]     BP 114/84 (BP Location: Right Arm, Patient Position: Sitting, Cuff Size: Normal)   Pulse 67   Temp 98.4 F (36.9 C)   SpO2 97%   Weight yesterday-didn't weigh Last visit weight-no recorded weight  ATF pt CAO x4, she's upset upon my arrival from an argument with her son. She has no complaints; no sob, no chest pain and no dizziness.  She's taken her medications as prescribed, she denies any medication changes since our last visit.  She still haven't gotten batteries for her scale but she said that she's lost weight.  During our visit she talked about her son and voting.  She had calmed down significantly before I left.  rx bottles verified.     Medication ordered: none, she orders her own meds, which are delivered  [place], EMT Paramedic [phone] 01/31/2019    ACTION: Home visit completed

## 2019-02-03 ENCOUNTER — Telehealth: Payer: Self-pay | Admitting: Podiatry

## 2019-02-03 NOTE — Telephone Encounter (Signed)
Patient is a diabetic and she pulled her toenail off with her sock. It was bleeding some but she just wanted to know what to do. Please call patient

## 2019-02-03 NOTE — Telephone Encounter (Signed)
Pt called and she states she is cleaning the area with antibacterial soap and covering with gauze. I told pt to use an antibiotic ointment on the area that bleed. Pt states she needs an appt on Wednesday to get her toenails trimmed and I transferred to scheduler.

## 2019-02-03 NOTE — Telephone Encounter (Signed)
Unable to leave a message music played for over 1 minute.

## 2019-02-11 ENCOUNTER — Ambulatory Visit (INDEPENDENT_AMBULATORY_CARE_PROVIDER_SITE_OTHER): Payer: Medicaid Other | Admitting: Podiatry

## 2019-02-11 ENCOUNTER — Encounter: Payer: Self-pay | Admitting: Podiatry

## 2019-02-11 ENCOUNTER — Other Ambulatory Visit: Payer: Self-pay

## 2019-02-11 ENCOUNTER — Ambulatory Visit: Payer: Medicaid Other | Admitting: Podiatry

## 2019-02-11 DIAGNOSIS — E1142 Type 2 diabetes mellitus with diabetic polyneuropathy: Secondary | ICD-10-CM | POA: Diagnosis not present

## 2019-02-11 DIAGNOSIS — B351 Tinea unguium: Secondary | ICD-10-CM

## 2019-02-11 DIAGNOSIS — M79675 Pain in left toe(s): Secondary | ICD-10-CM | POA: Diagnosis not present

## 2019-02-11 DIAGNOSIS — M79674 Pain in right toe(s): Secondary | ICD-10-CM | POA: Diagnosis not present

## 2019-02-11 DIAGNOSIS — M722 Plantar fascial fibromatosis: Secondary | ICD-10-CM

## 2019-02-11 NOTE — Progress Notes (Signed)
Subjective: Michele Owens is seen today for follow up painful, thickened toenails 1-5 b/l feet that she cannot cut. Pain interferes with daily activities. Aggravating factor includes wearing enclosed shoe gear and relieved with periodic debridement.  She is s/p permanent nail avulsion right 2nd digit.  She states she doesn't want me to cut her right 3rd toenail because she pulled some of it off a few days ago. She states she cleaned it and it does not need cutting.  She is requesting heel injection for her right heel on today. She feels her plantar fasciitis is flaring up. She continues to wear her diabetic shoes on today.   Ms. Michele Owens verbalized she had issue with her appointment, but it was handled with our administrator, Renaldo Harrison, and she feels heard and satisfied with outcome of conversation.  Pt also states she is worried after her son attempted suicide two weeks ago.  Current Outpatient Medications on File Prior to Visit  Medication Sig  . albuterol (PROVENTIL HFA;VENTOLIN HFA) 108 (90 Base) MCG/ACT inhaler Inhale 1-2 puffs into the lungs every 6 (six) hours as needed for wheezing or shortness of breath.  . allopurinol (ZYLOPRIM) 100 MG tablet Take 100 mg by mouth daily.  Michele Owens ELLIPTA 62.5-25 MCG/INH AEPB Inhale 1 puff into the lungs daily.   . Blood Glucose Monitoring Suppl (ACCU-CHEK AVIVA PLUS) w/Device KIT USE AS DIRECTED TO CHECK BLOOD SUGAR  . butalbital-acetaminophen-caffeine (FIORICET, ESGIC) 50-325-40 MG tablet Take 1-2 tablets by mouth every 4 (four) hours as needed for migraine.  . calcitRIOL (ROCALTROL) 0.5 MCG capsule Take 1 capsule (0.5 mcg total) by mouth daily.  . carvedilol (COREG) 6.25 MG tablet Take 1 tablet (6.25 mg total) by mouth 2 (two) times daily with a meal.  . cetirizine (ZYRTEC) 10 MG tablet Take 10 mg by mouth daily.  . clonazePAM (KLONOPIN) 2 MG tablet Take 2 mg by mouth 3 (three) times daily. for anxiety  . diclofenac sodium (VOLTAREN) 1 % GEL Apply 2 g  topically 4 (four) times daily as needed (for pain).  Marland Kitchen EPINEPHrine 0.3 mg/0.3 mL IJ SOAJ injection Inject 0.3 mg into the muscle daily as needed (allergic reaction).  . FEROSUL 325 (65 Fe) MG tablet Take 325 mg by mouth daily.  . furosemide (LASIX) 20 MG tablet Take 2 tablets (40 mg total) by mouth 2 (two) times daily. Take 53m every morning and 462mevery evening (Patient taking differently: Take 40 mg by mouth 2 (two) times daily. Take 4037mvery morning and 65m65mery evening)  . glucose blood test strip Use as instructed to check blood sugar up to 4 times daily  . guaiFENesin (MUCINEX) 600 MG 12 hr tablet Take 2 tablets (1,200 mg total) by mouth 2 (two) times daily.  . HYMarland KitchenROcodone-acetaminophen (NORCO/VICODIN) 5-325 MG tablet Take 1 tablet by mouth every 6 (six) hours as needed for moderate pain.   . ipMarland Kitchenatropium-albuterol (DUONEB) 0.5-2.5 (3) MG/3ML SOLN Take 3 mLs by nebulization 4 (four) times daily.   . leMarland Kitchenothyroxine (SYNTHROID, LEVOTHROID) 112 MCG tablet Take 224 mcg by mouth daily.  . liMarland Kitchenocaine (LIDODERM) 5 % Place 1 patch onto the skin daily. Remove & Discard patch within 12 hours or as directed by MD  . lisinopril (PRINIVIL,ZESTRIL) 5 MG tablet Take 1 tablet (5 mg total) by mouth daily. Please call for office visit [phone] (IMODIUM) 2 MG capsule Take 1 capsule (2 mg total) by mouth 4 (four) times daily as needed for diarrhea or loose stools.  .Marland Kitchen  metFORMIN (GLUCOPHAGE) 500 MG tablet Take 1 tablet (500 mg total) by mouth 2 (two) times daily with a meal.  . methocarbamol (ROBAXIN) 500 MG tablet Take 1 tablet (500 mg total) by mouth every 8 (eight) hours as needed for muscle spasms.  . metoCLOPramide (REGLAN) 10 MG tablet Take 1 tablet (10 mg total) by mouth every 6 (six) hours as needed for nausea (or headache).  . metoprolol succinate (TOPROL-XL) 25 MG 24 hr tablet TAKE ONE TABLET BY MOUTH ONCE EVERY MORNING  . montelukast (SINGULAIR) 10 MG tablet Take 1 tablet (10 mg  total) by mouth at bedtime.  . nitroGLYCERIN (NITROSTAT) 0.4 MG SL tablet Place 1 tablet (0.4 mg total) under the tongue every 5 (five) minutes as needed for chest pain.  Marland Kitchen nystatin cream (MYCOSTATIN) Apply 1 application topically 2 (two) times daily as needed for dry skin.   Marland Kitchen omeprazole (PRILOSEC) 40 MG capsule Take 40 mg by mouth daily.  . OXYGEN Inhale 2 L into the lungs at bedtime.  . potassium chloride SA (K-DUR,KLOR-CON) 20 MEQ tablet Take 1 tablet (20 mEq total) by mouth 2 (two) times daily.  . pravastatin (PRAVACHOL) 40 MG tablet Take 40 mg by mouth every evening.   . QC STOOL SOFTENER PLS LAXATIVE 8.6-50 MG tablet Take 1 tablet by mouth at bedtime.  Marland Kitchen tiotropium (SPIRIVA) 18 MCG inhalation capsule Place 18 mcg into inhaler and inhale daily.   No current facility-administered medications on file prior to visit.      Allergies  Allergen Reactions  . Bee Venom Swelling and Other (See Comments)    "All over my body" (swelling)  . Fioricet [Butalbital-Apap-Caffeine] Nausea And Vomiting and Rash  . Ibuprofen Rash and Other (See Comments)    Severe rash  . Lamisil [Terbinafine] Rash and Other (See Comments)    Pt states this causes her to "feel funny"  . Nsaids Other (See Comments)    Per MD's orders      Objective:  Vascular Examination: Capillary refill time <4 seconds b/l.  Dorsalis pedis and Posterior tibial pulses 1/4 b/l.  Digital hair sparse b/l.  Skin temperature gradient WNL b/l.   Dermatological Examination: Skin with normal turgor, texture and tone b/l.  No open wounds b/l.  Toenails 3-5 right, 2-5 left discolored, thick, dystrophic with subungual debris and pain with palpation to nailbeds due to thickness of nails.  Evidence of dried heme distal tip of right 3rd digit. Lateral border incarcerated, No erythema, no edema, no drainage.   Incurvated nailplate left 3rd digit lateral border with nail border hypertrophy. No erythema, no edema, no drainage  noted.  Anonychia b/l great toes and right 2nd digit with evidence of permanent total nail avulsion. Nailbeds remain completely epithelialized and intact.  Musculoskeletal: Muscle strength 5/5 to all LE muscle groups  Mild hammertoe deformity 2-5 b/l.  Mild tenderness on palpation medial tubercle right foot.   No pain, crepitus or joint limitation noted with ROM.   Neurological Examination: Protective sensation decreased with 10 gram monofilament bilaterally.  Assessment: Painful onychomycosis toenails 3-5 right, 2-5 left Plantar fasciitis right foot NIDDM with neuropathy  Plan: 1. Per patient request, right 3rd digit not debrided. Digit was curretaged to free the ingrown nail.  2. Toenails 2-5 left, 4, 5 right foot were debrided in length and girth without iatrogenic bleeding. 3. Patient changed her mind about heel injection. Arch pads were added to her diabetic insoles. 4. Patient to continue soft, supportive shoe gear. 5. Patient to  report any pedal injuries to medical professional immediately. 6. Follow up 3 months. Per patient via Renaldo Harrison, patient to be scheduled with Dr. Milinda Pointer.  7. Patient/POA to call should there be a concern in the interim.

## 2019-02-24 IMAGING — US US THYROID BIOPSY
1 series · 13 of 25 positions shown · non-contrast
Comparison: US done 01/24/2016

MEDICATIONS:
1% Lidocaine = 5 mL

COMPLICATIONS:
None immediate.

INDICATION: Indeterminate right thyroid nodule

EXAM:
ULTRASOUND GUIDED FINE NEEDLE ASPIRATION OF INDETERMINATE RIGHT
THYROID NODULE
TECHNIQUE: Informed written consent was obtained from the patient after a
discussion of the risks, benefits and alternatives to treatment.
Questions regarding the procedure were encouraged and answered. A
timeout was performed prior to the initiation of the procedure.

[Series 1: us thyroid biopsy · 0.06mm/px · 26 acquisitions, 13 frames shown]
[im 1/26]
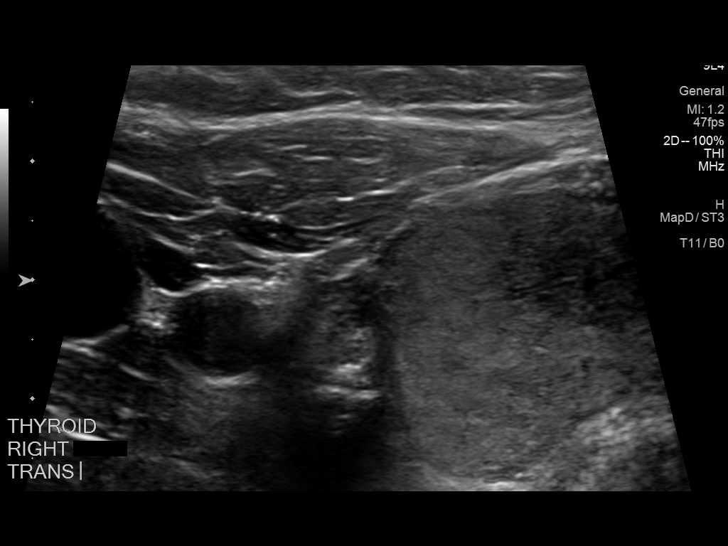
[im 3/26]
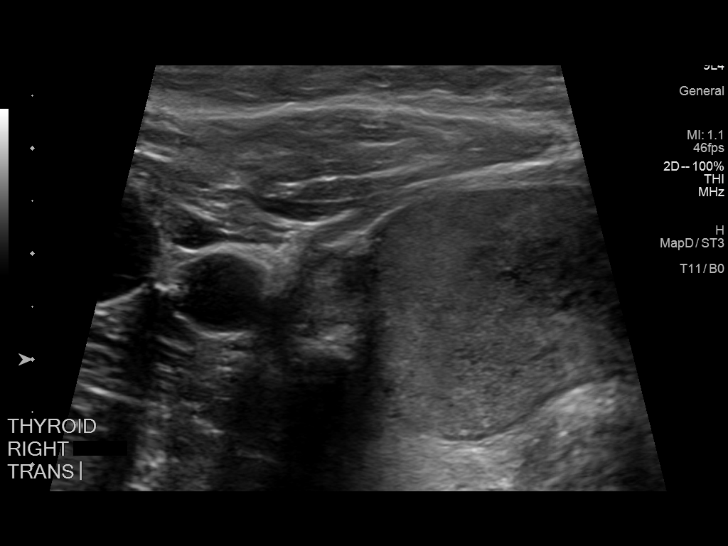
[im 5/26]
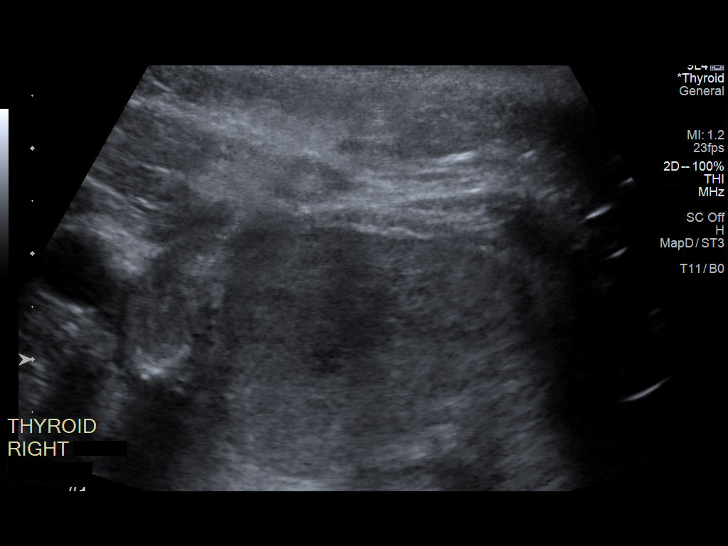
[im 7/26]
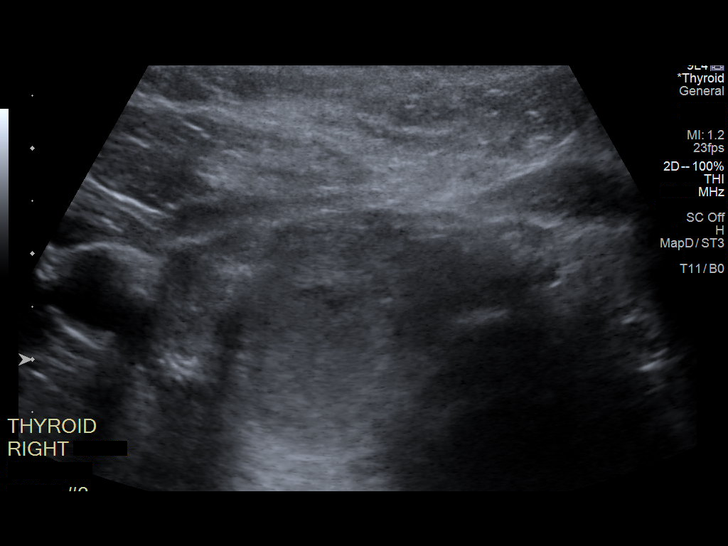
[im 9/26]
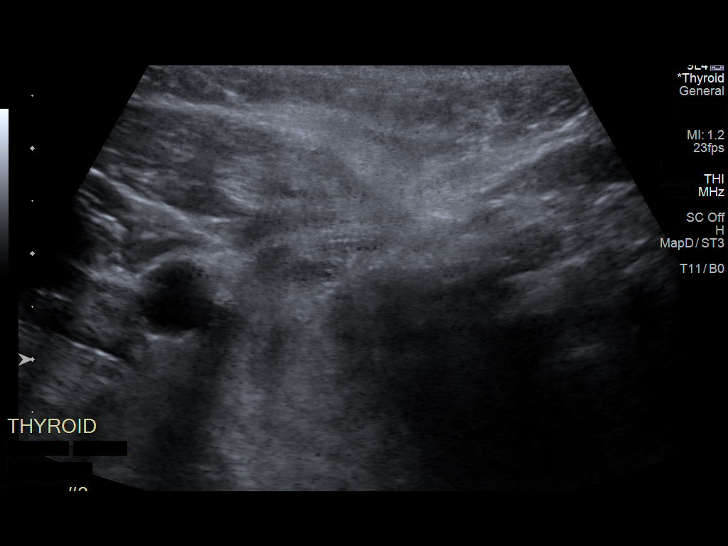
[im 11/26]
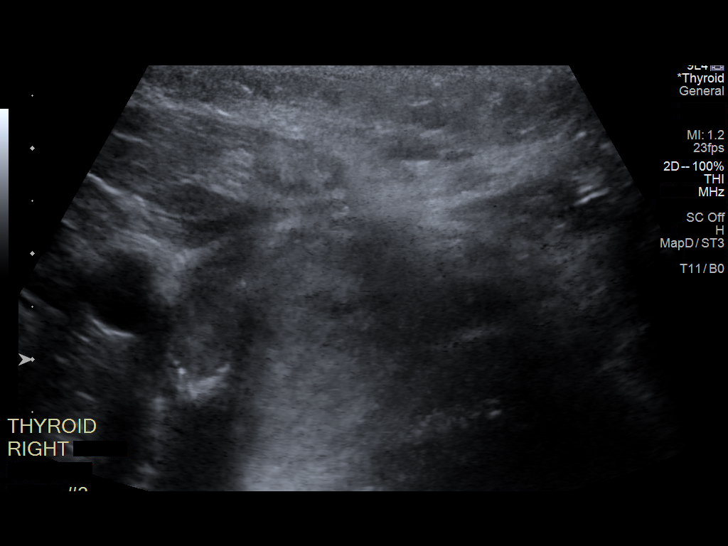
[im 13/26]
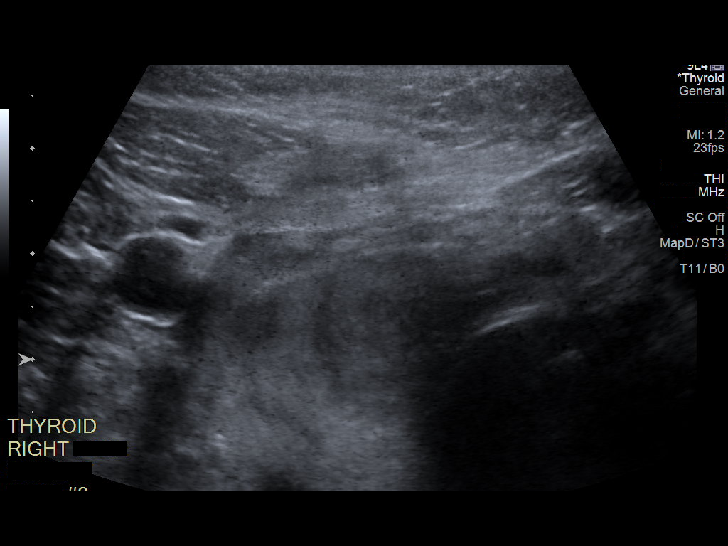
[im 15/26]
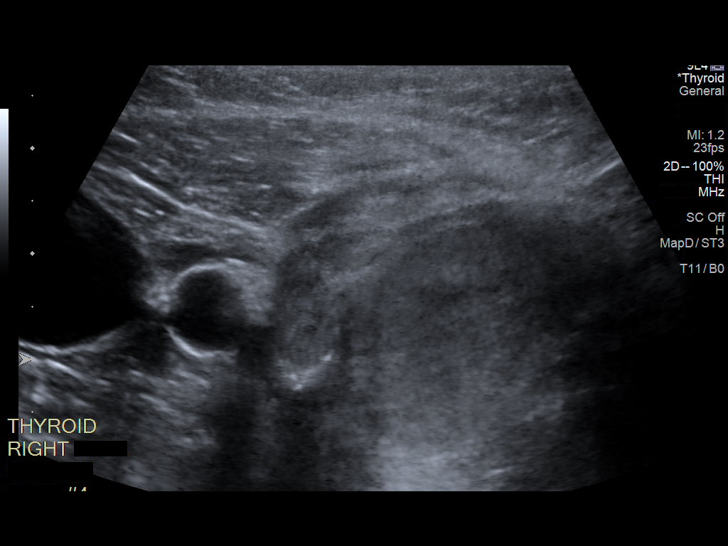
[im 17/26]
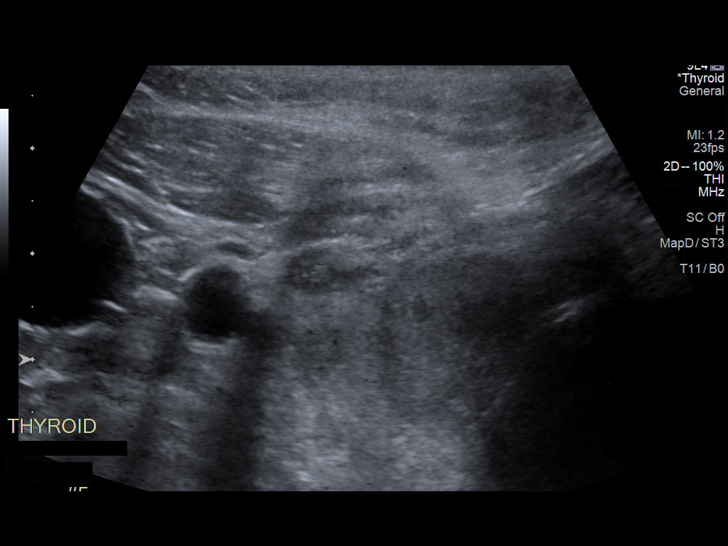
[im 19/26]
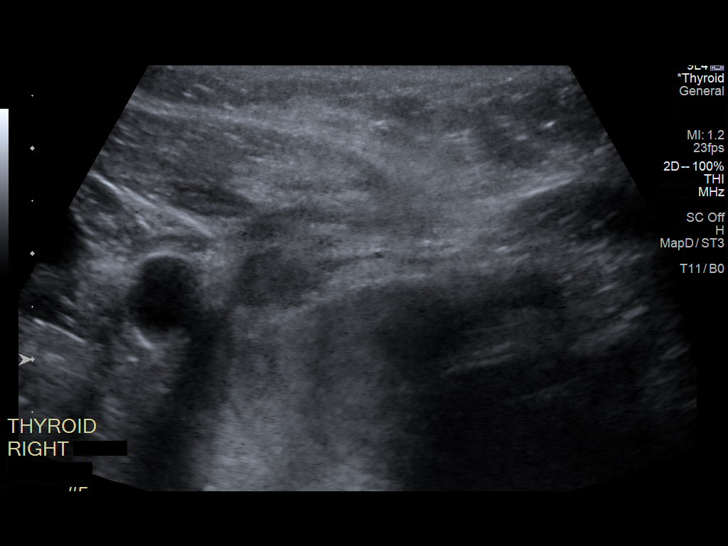
[im 21/26]
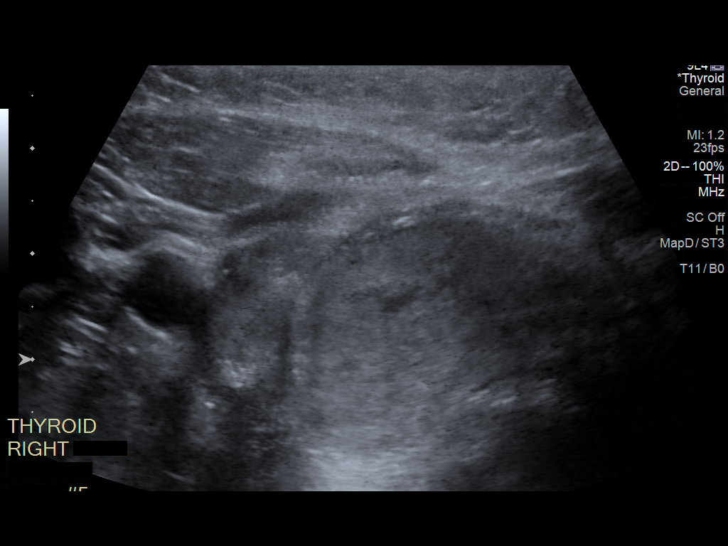
[im 23/26]
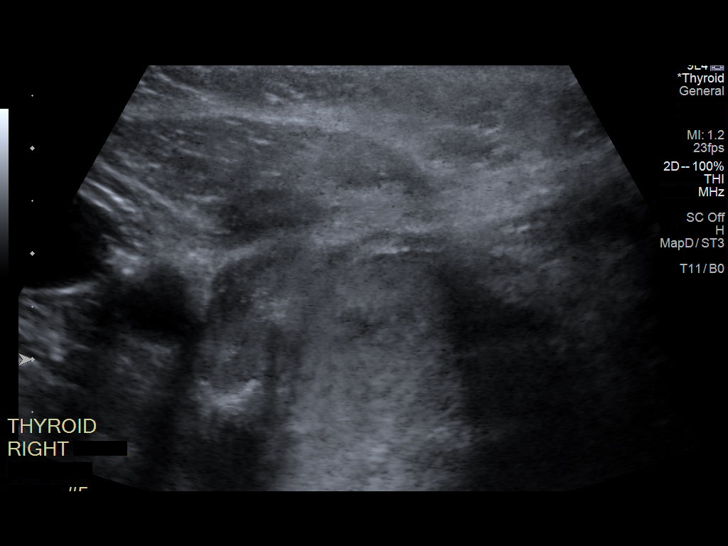
[im 26/26]
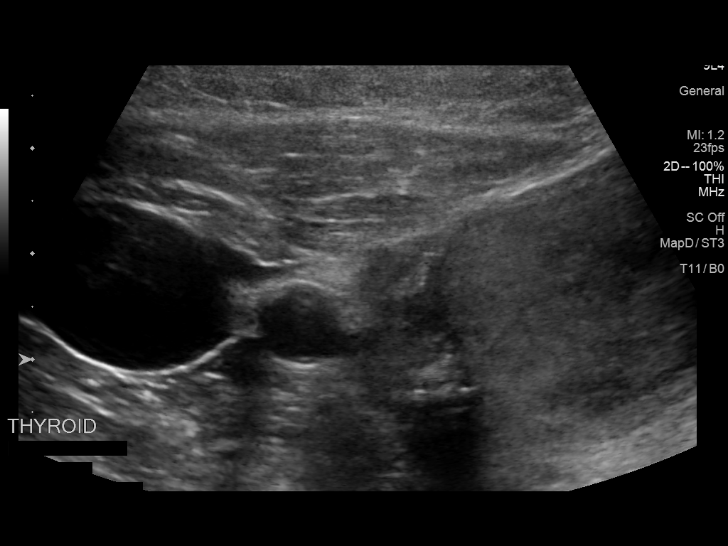

[13 of 25 positions shown; findings below may reference images not displayed]

Pre-procedural ultrasound scanning demonstrated unchanged size and
appearance of the indeterminate nodule within the right lobe of the
thyroid.

The procedure was planned. The neck was prepped in the usual sterile
fashion, and a sterile drape was applied covering the operative
field. A timeout was performed prior to the initiation of the
procedure. Local anesthesia was provided with 1% lidocaine.

Under direct ultrasound guidance, 5 FNA biopsies were performed of
the right superior with a 22 gauge Inrad needle. Multiple ultrasound
images were saved for procedural documentation purposes. The samples
were prepared and submitted to pathology. The procedure was
technically difficult due to the patients body habitus, neck size,
depth of the nodule, deep breathing and constant motion.

Limited post procedural scanning was negative for hematoma or
additional complication. Dressings were placed. The patient
tolerated the above procedures procedure well without immediate
postprocedural complication.
FINDINGS: FINDINGS
Nodule reference number based on prior diagnostic ultrasound: 3

Maximum size:  1.5 cm

Location: Right; Superior

ACR TI-RADS risk category: TR5 (>/= 7 points)

Reason for biopsy: indeterminate (FLUS, NECTALI, Follicular neoplasm) on
prior biopsy

Ultrasound imaging confirms appropriate placement of the needles
within the thyroid nodule.
IMPRESSION: Technically difficult but successful ultrasound guided fine needle
aspiration of right superior thyroid nodule.

## 2019-02-28 ENCOUNTER — Telehealth (HOSPITAL_COMMUNITY): Payer: Self-pay

## 2019-02-28 NOTE — Telephone Encounter (Signed)
I called Michele Owens to set an appointment, she asked me to call her next week because she's "busy today".  I told her that I'll call her Tuesday.

## 2019-02-28 NOTE — Telephone Encounter (Signed)
Michele Owens was crying and very upset. She said that her cousin just passed away from Covid-19.  I gave her my condolences and told her that I will check on her next week.

## 2019-03-17 ENCOUNTER — Other Ambulatory Visit: Payer: Self-pay | Admitting: Internal Medicine

## 2019-03-17 DIAGNOSIS — Z1231 Encounter for screening mammogram for malignant neoplasm of breast: Secondary | ICD-10-CM

## 2019-03-18 ENCOUNTER — Telehealth: Payer: Self-pay | Admitting: Podiatry

## 2019-03-18 NOTE — Telephone Encounter (Signed)
Pt called upset with our office and spoke to [place]. Because we are not able to getting diabetic shoes thru her insurance with our office. She said her Dr,Dr Nadara Mustard said she should be able to get diabetic shoes with [place] since we treat pt. Pt feels like she is getting the run around.

## 2019-03-19 ENCOUNTER — Other Ambulatory Visit: Payer: Self-pay

## 2019-03-19 ENCOUNTER — Ambulatory Visit
Admission: RE | Admit: 2019-03-19 | Discharge: 2019-03-19 | Disposition: A | Discharge status: Home / Self Care | Payer: Medicaid Other | Source: Ambulatory Visit | Attending: Internal Medicine | Admitting: Internal Medicine

## 2019-03-19 DIAGNOSIS — Z1231 Encounter for screening mammogram for malignant neoplasm of breast: Secondary | ICD-10-CM

## 2019-03-22 IMAGING — CR DG CHEST 1V PORT
1 series · 1 of 1 positions shown · non-contrast
Comparison: 01/25/2016

CLINICAL DATA: Shortness of breath

EXAM:
PORTABLE CHEST 1 VIEW

[AP]
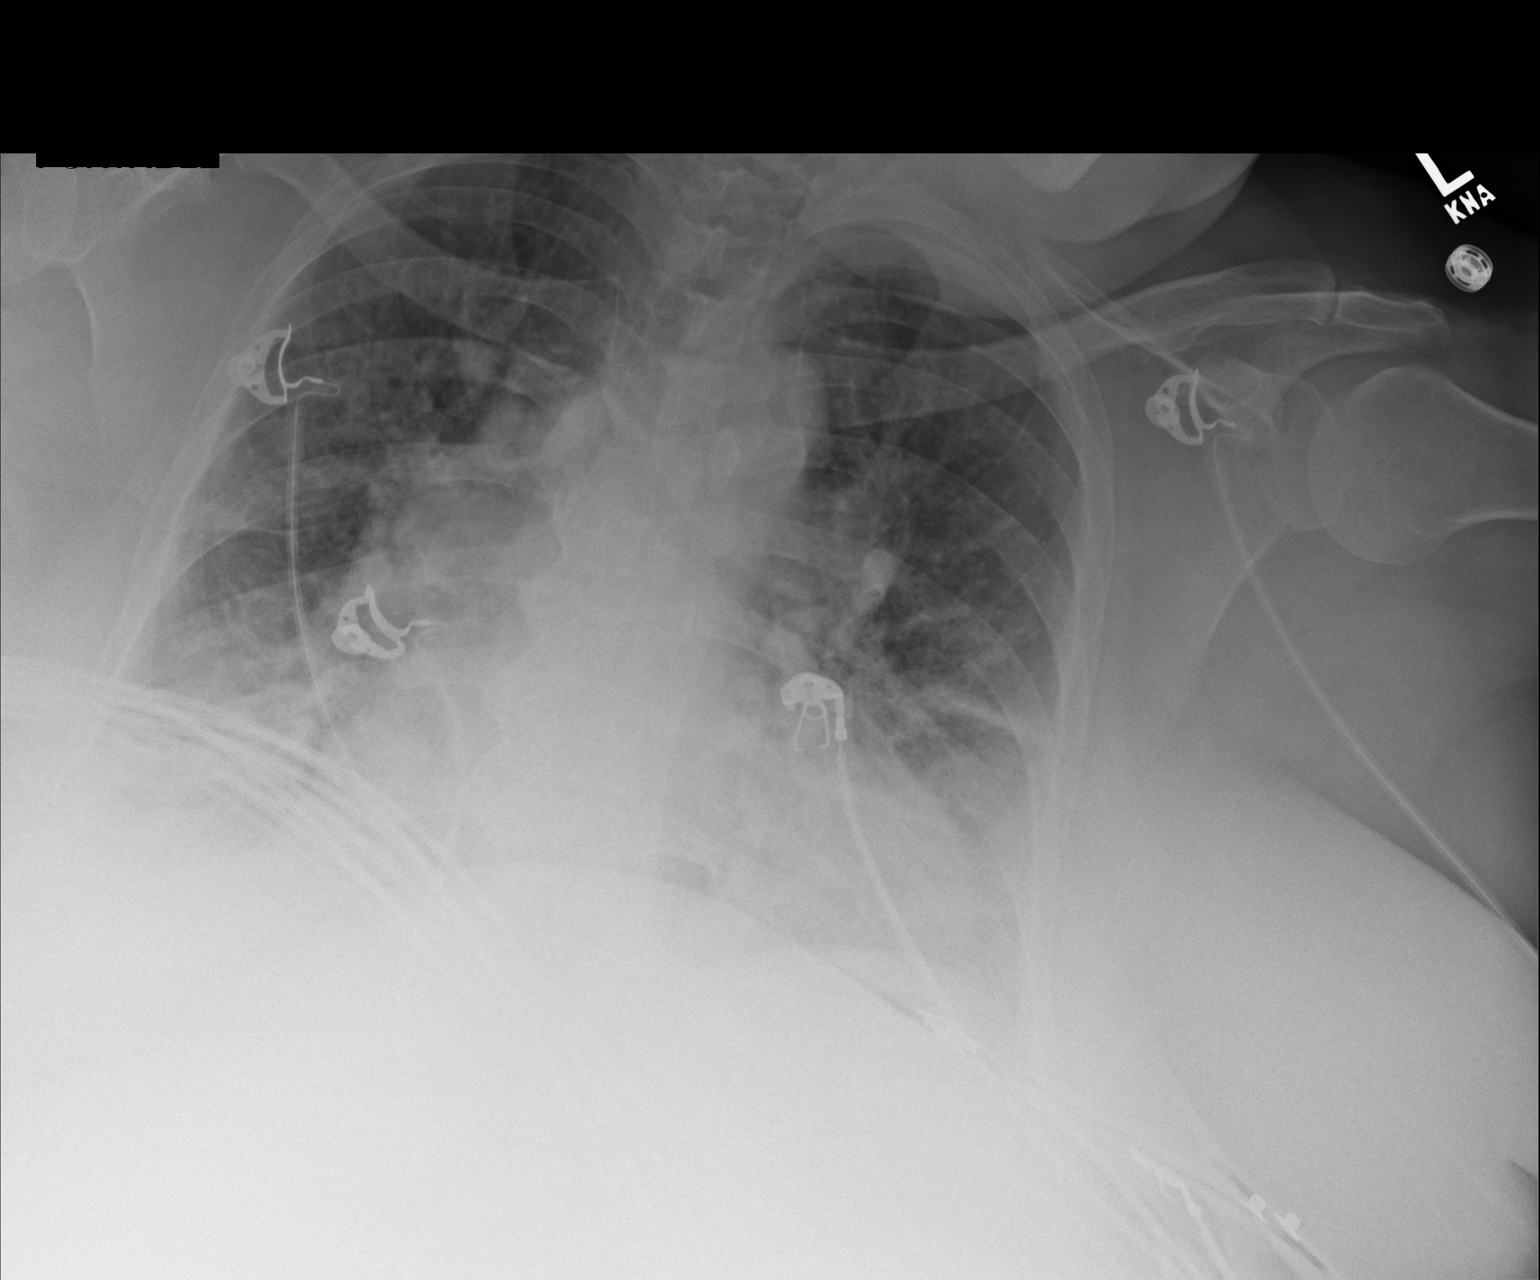

[1 of 1 positions shown; findings below may reference images not displayed]

FINDINGS: Limited rotated study.  Patient unable to help with positioning.

Chronic cardiopericardial enlargement. Stable mediastinal contours
when allowing for rotation. Bandlike opacities consistent with
atelectasis. No convincing change from prior when allowing for
differences in soft tissue positioning and obscuration.
IMPRESSION: 1. Limited study due to patient factors, especially in evaluating
the right base.
2. Patchy bilateral atelectasis or scarring, also seen previously.
Cannot exclude superimposed pneumonia at the bases.
3. Cardiomegaly without failure.

## 2019-03-22 IMAGING — CR DG CHEST 1V PORT
1 series · 1 of 1 positions shown · non-contrast
Comparison: 07/16/2016 at 5638 hours

CLINICAL DATA: Intubated

EXAM:
PORTABLE CHEST 1 VIEW

[AP]
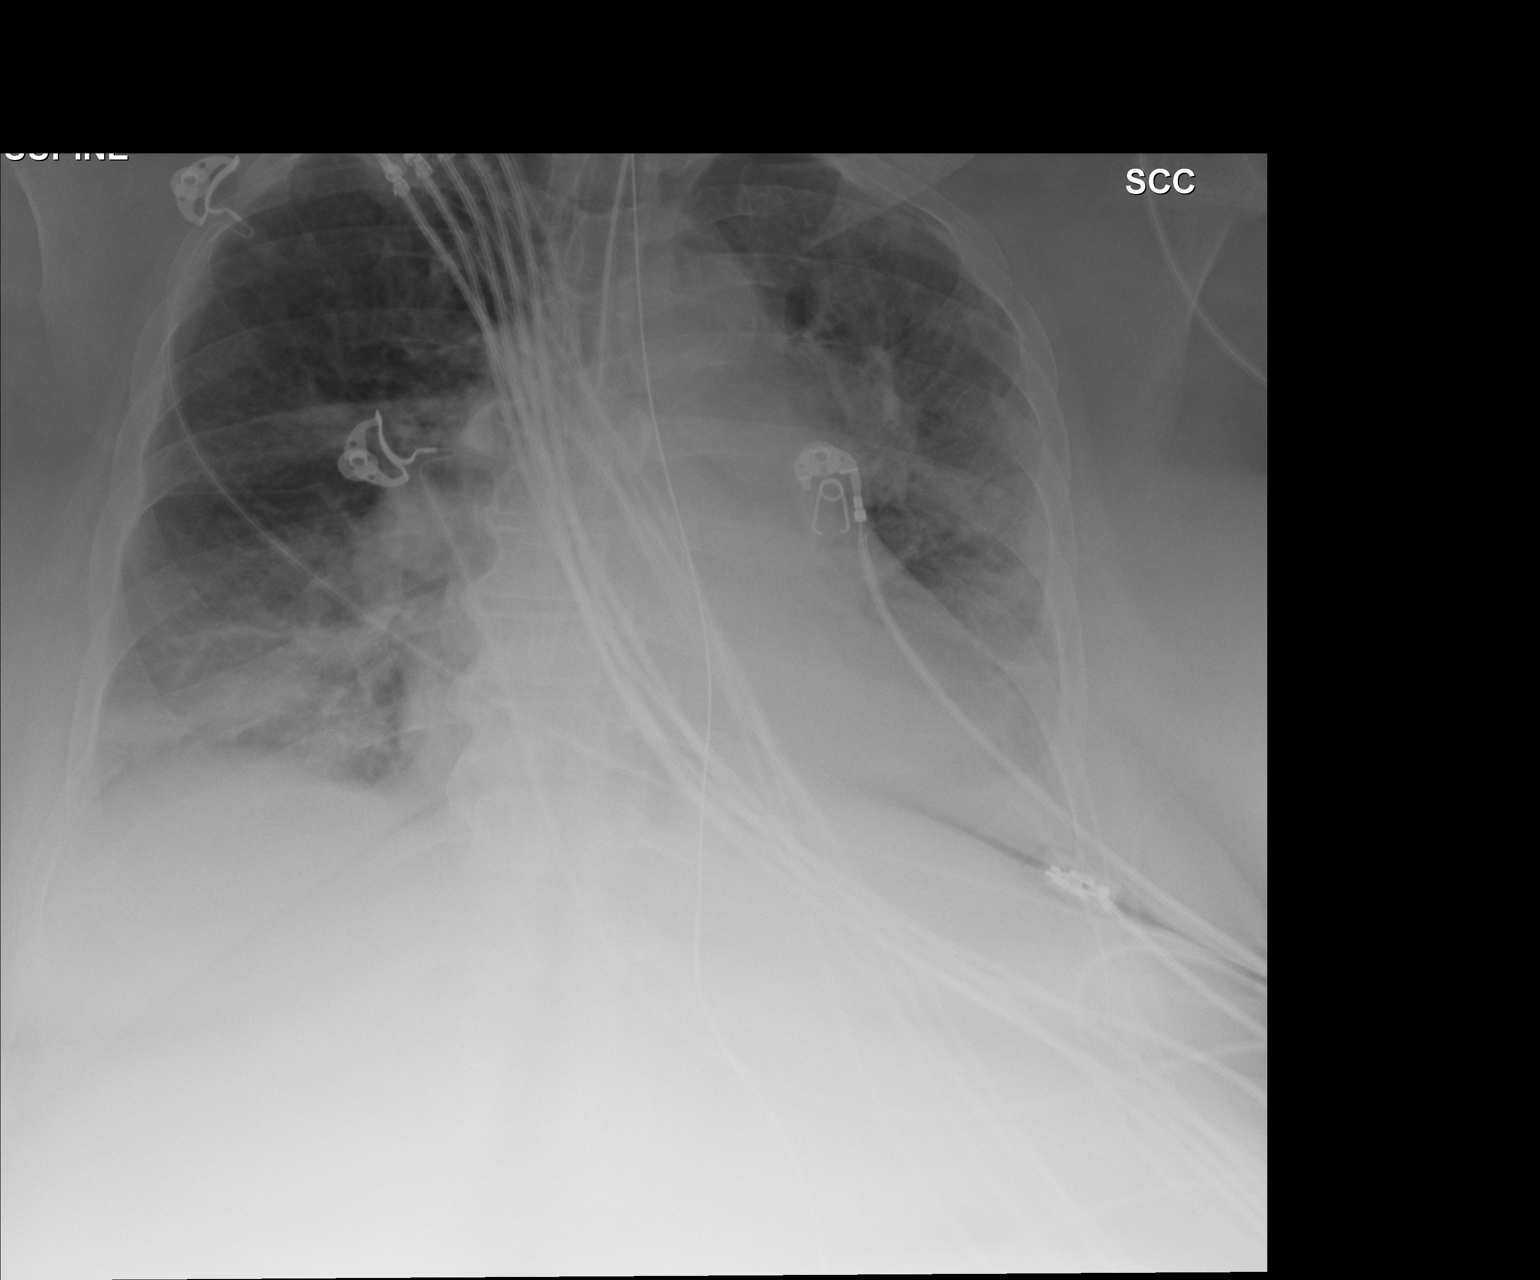

[1 of 1 positions shown; findings below may reference images not displayed]

FINDINGS: Endotracheal tube at/ just above the carina, although partially
obscured by overlying leads/ wires.

Cardiomegaly with pulmonary vascular congestion. No frank
interstitial edema.

Patchy right lower lobe opacity, suspicious for pneumonia. No
definite pleural effusions. No pneumothorax.

Enteric tube courses into the stomach.
IMPRESSION: Endotracheal tube at/just above the carina, although partially
obscured.

Cardiomegaly with pulmonary vascular congestion. No frank
interstitial edema.

Patchy right lower lobe opacity, suspicious for pneumonia.

## 2019-03-23 IMAGING — CR DG CHEST 1V PORT
1 series · 1 of 1 positions shown · non-contrast
Comparison: One-view chest x-ray 07/16/2016

CLINICAL DATA: Respiratory failure secondary to COPD. Ventilator
dependent.

EXAM:
PORTABLE CHEST 1 VIEW

[AP]
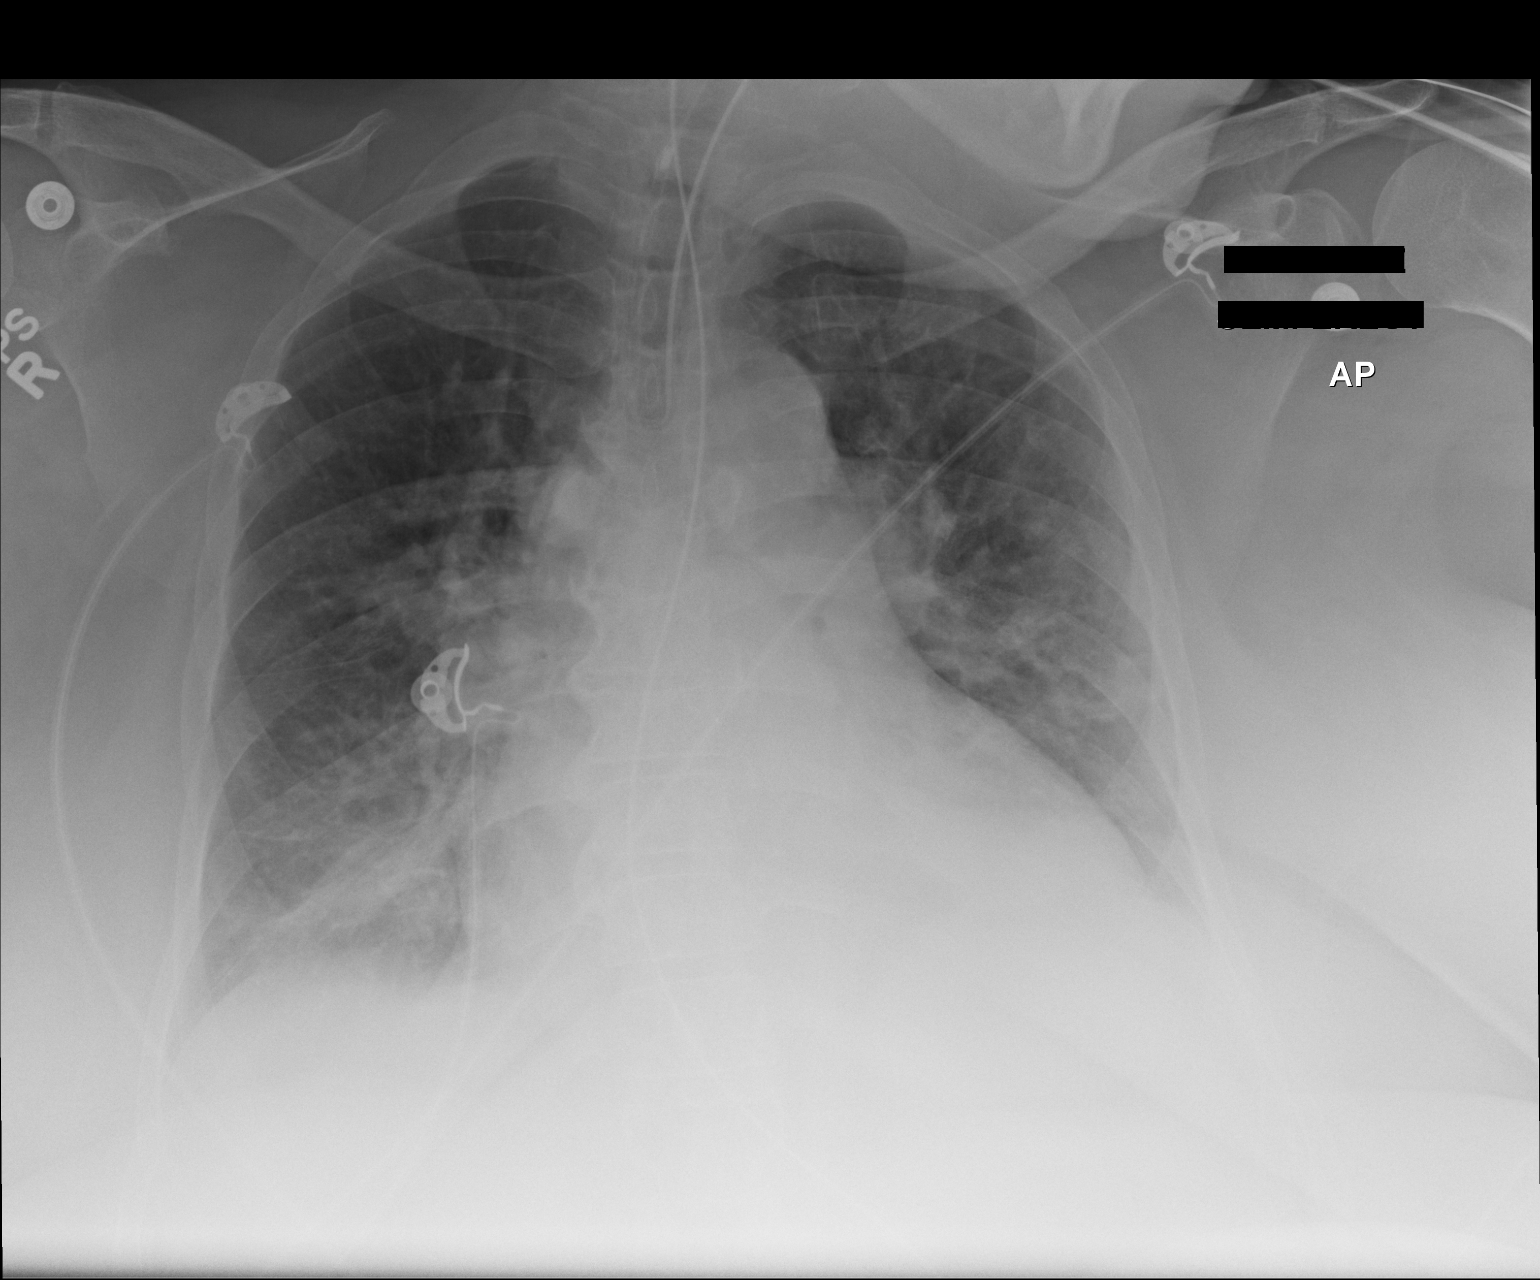

[1 of 1 positions shown; findings below may reference images not displayed]

FINDINGS: The heart size is exaggerated by low lung volumes. Moderate
pulmonary vascular congestion is stable. Bibasilar airspace disease
is slightly improved. Endotracheal tube now terminates 2.5 cm above
the carina. The an NG tube courses off the inferior border of the
film.
IMPRESSION: 1. Persistent low lung volumes and moderate pulmonary vascular
congestion.
2. Improving bilateral lower lobe airspace disease reflecting
atelectasis or pneumonia.

## 2019-03-23 IMAGING — CR DG ABD PORTABLE 1V
1 series · 1 of 1 positions shown · non-contrast
Comparison: 07/17/2016

CLINICAL DATA: Post NG tube adjustment

EXAM:
PORTABLE ABDOMEN - 1 VIEW

[AP]
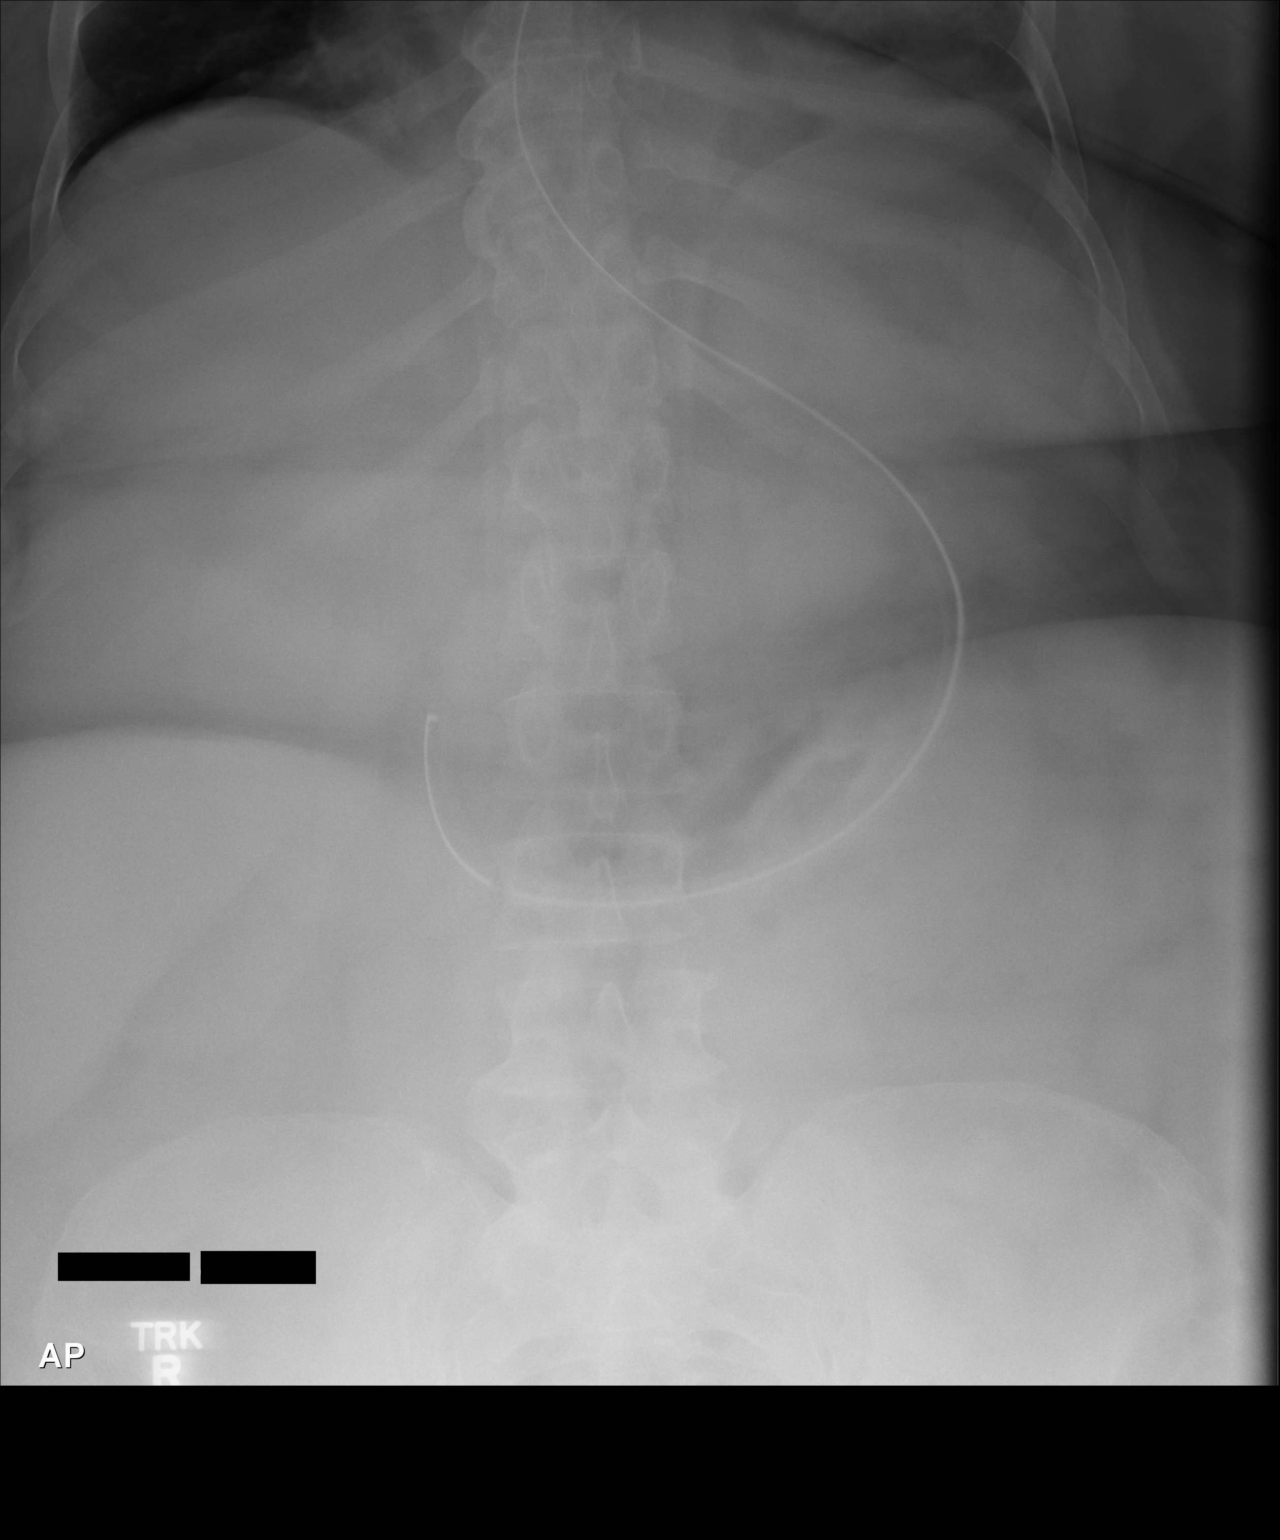

[1 of 1 positions shown; findings below may reference images not displayed]

FINDINGS: Advancement of the NG tube with the tip in the distal stomach. No
evidence of bowel obstruction or free air.
IMPRESSION: Advancement of the NG tube with the tip likely in the distal
stomach.

## 2019-03-24 IMAGING — DX DG CHEST 1V PORT
1 series · 1 of 1 positions shown · non-contrast
Comparison: 07/17/2016

CLINICAL DATA: Acute on chronic respiratory failure with hypoxemia

EXAM:
PORTABLE CHEST 1 VIEW

[chest ap]
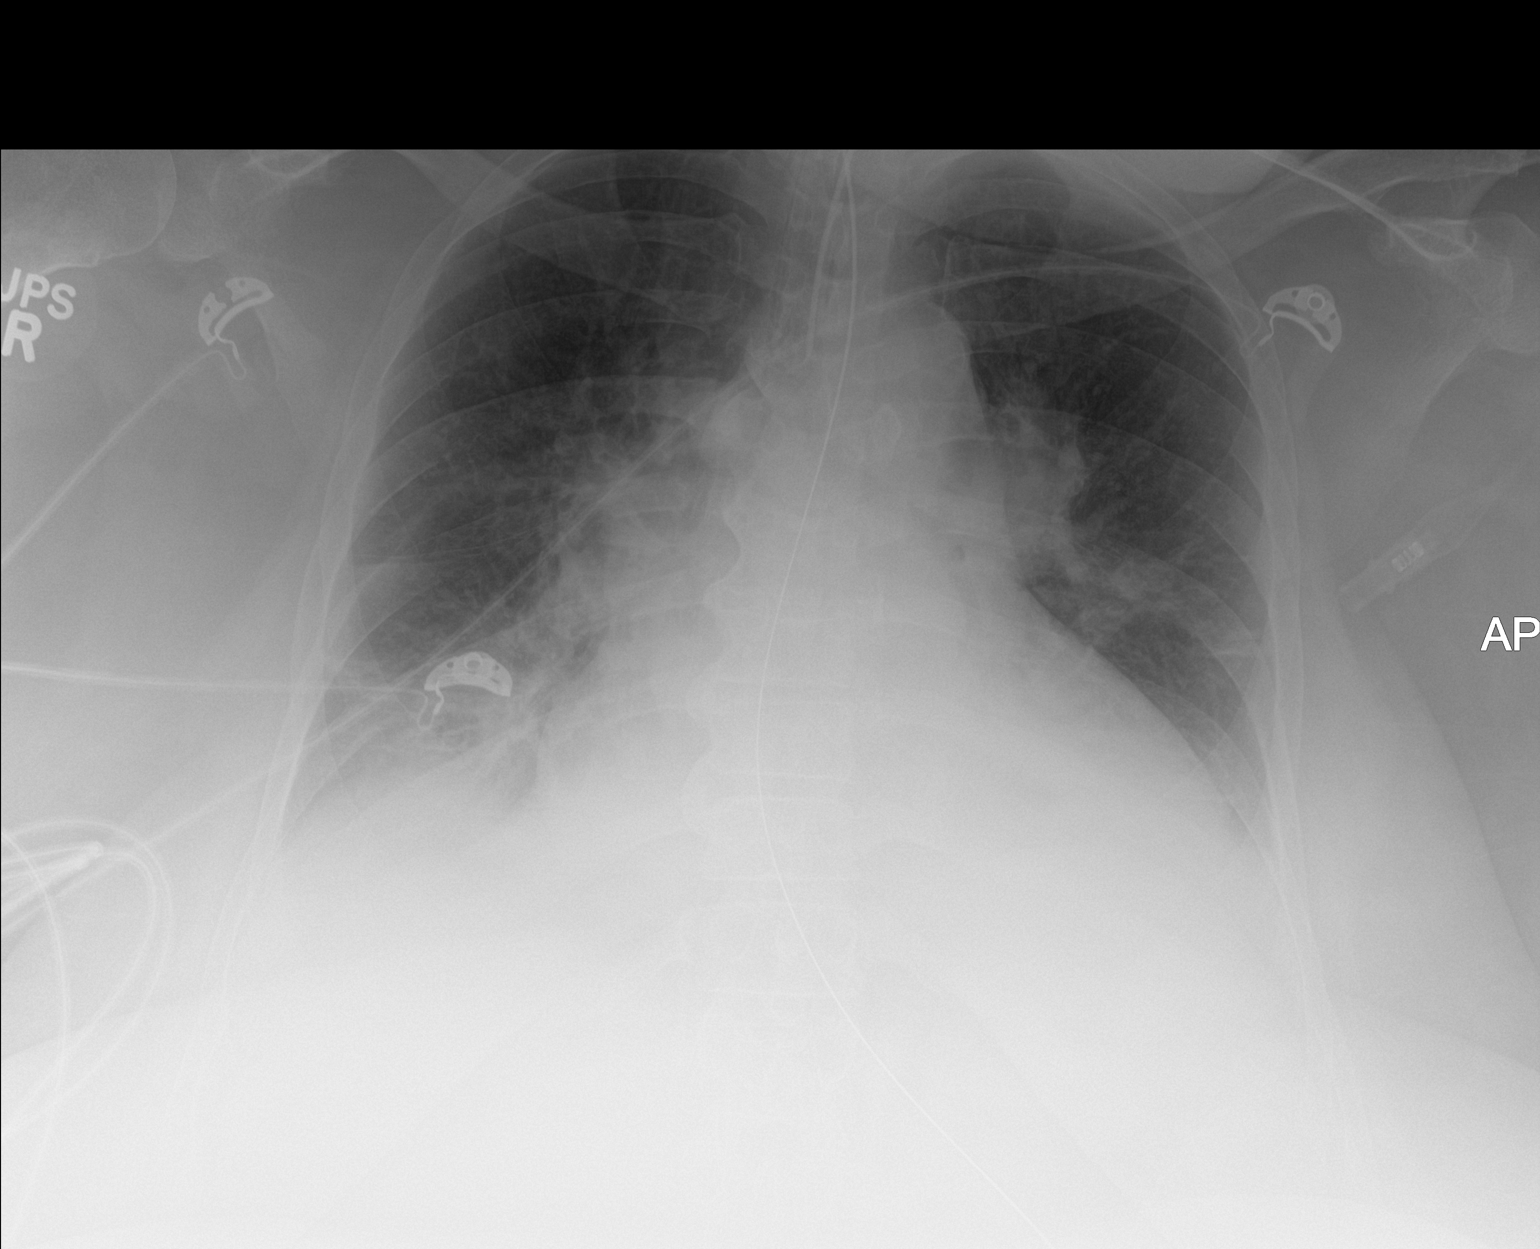

[1 of 1 positions shown; findings below may reference images not displayed]

FINDINGS: Endotracheal tube and NG tube remain in place, unchanged. There is
cardiomegaly with vascular congestion. Bilateral lower lobe airspace
opacities with probable bilateral effusions. No acute bony
abnormality.
IMPRESSION: Cardiomegaly with vascular congestion. Stable bibasilar atelectasis
or infiltrates with small effusions.

## 2019-03-25 IMAGING — DX DG CHEST 1V PORT
1 series · 1 of 1 positions shown · non-contrast
Comparison: July 18, 2016

CLINICAL DATA: Hypoxia

EXAM:
PORTABLE CHEST 1 VIEW

[chest ap]
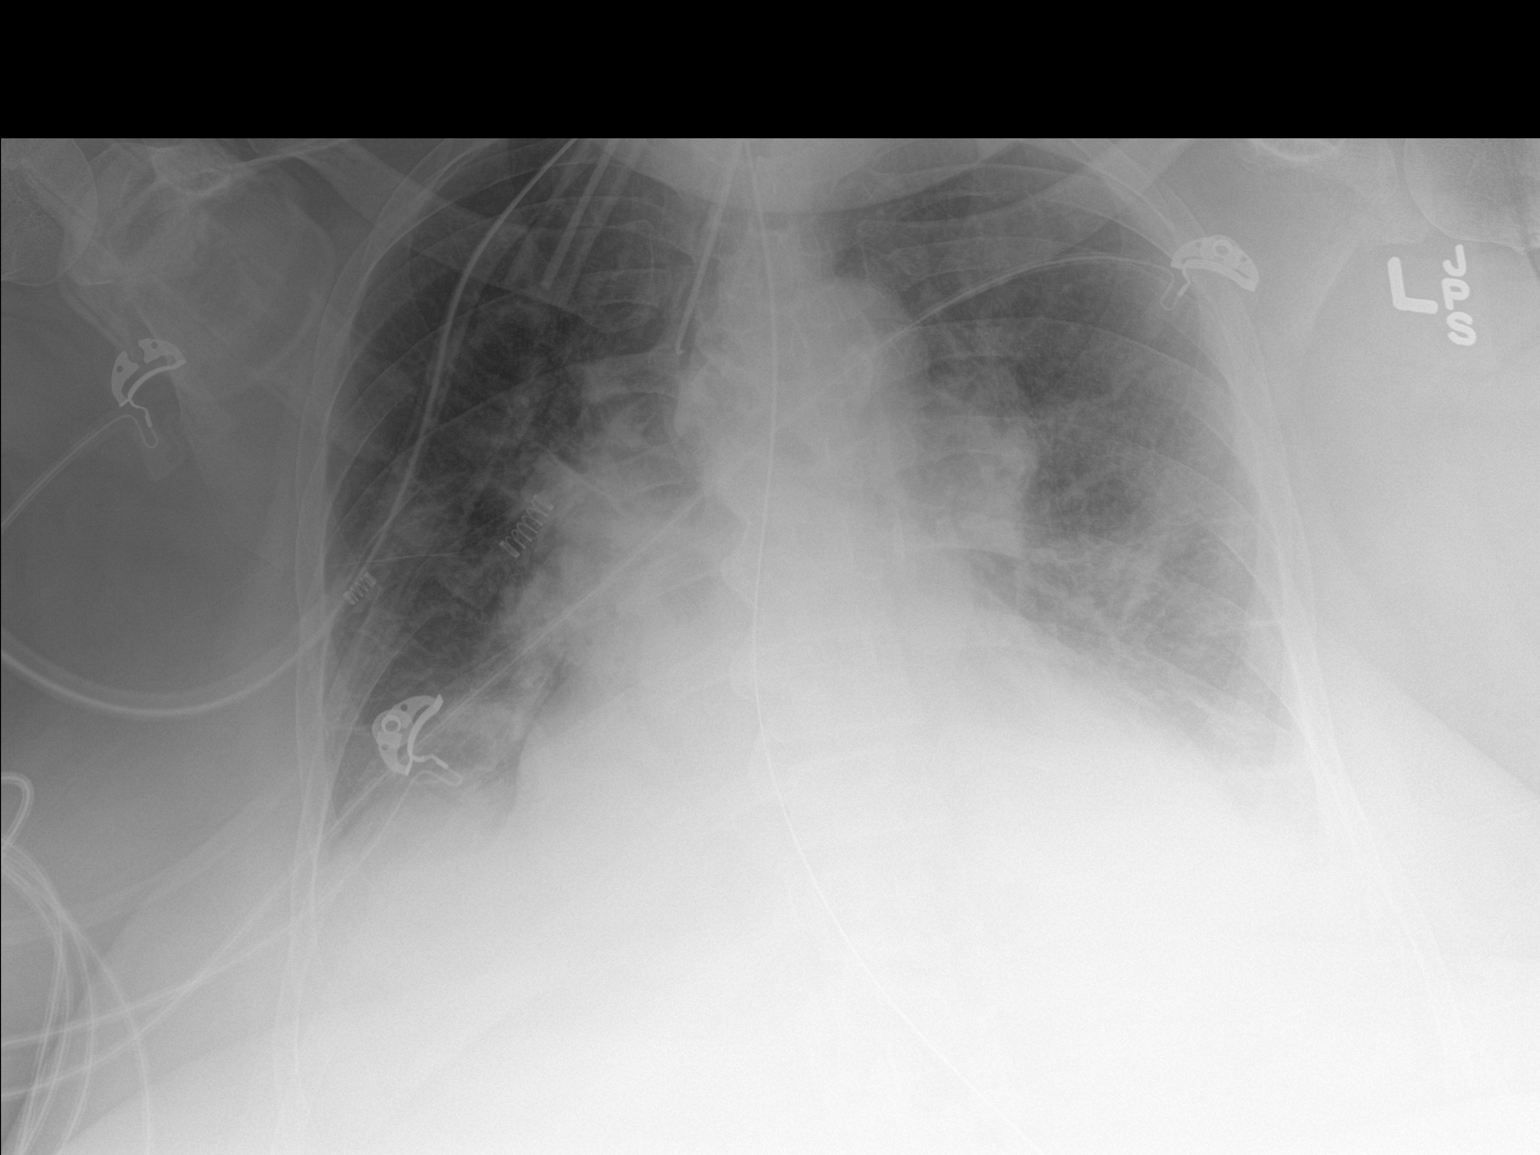

[1 of 1 positions shown; findings below may reference images not displayed]

FINDINGS: Endotracheal tube tip is 1.2 cm above the carina. Nasogastric tube
tip and side port are below the diaphragm. No pneumothorax is
evident. There are small pleural effusions bilaterally with patchy
opacity in the lung bases, likely combination of atelectasis and
infiltrate. There is mild linear atelectasis in the left mid lower
lung zones and right base regions as well. No appreciable edema.
There is cardiomegaly with pulmonary venous hypertension.
IMPRESSION: Tube positions as described without pneumothorax. Endotracheal tube
is close to the carina and should be withdrawn approximately 2-3 cm.

Evidence of a degree of congestive heart failure. Question
superimposed pneumonia in the bases. There is atelectasis in the
bases, and the opacities in the bases could be due to edema and
atelectasis as opposed to pneumonia. Particular attention to these
areas on subsequent evaluations warranted.

## 2019-03-26 IMAGING — DX DG CHEST 1V PORT
1 series · 1 of 1 positions shown · non-contrast
Comparison: 07/19/2016

CLINICAL DATA: Respiratory failure.  Ventilator support.

EXAM:
PORTABLE CHEST 1 VIEW

[chest ap]
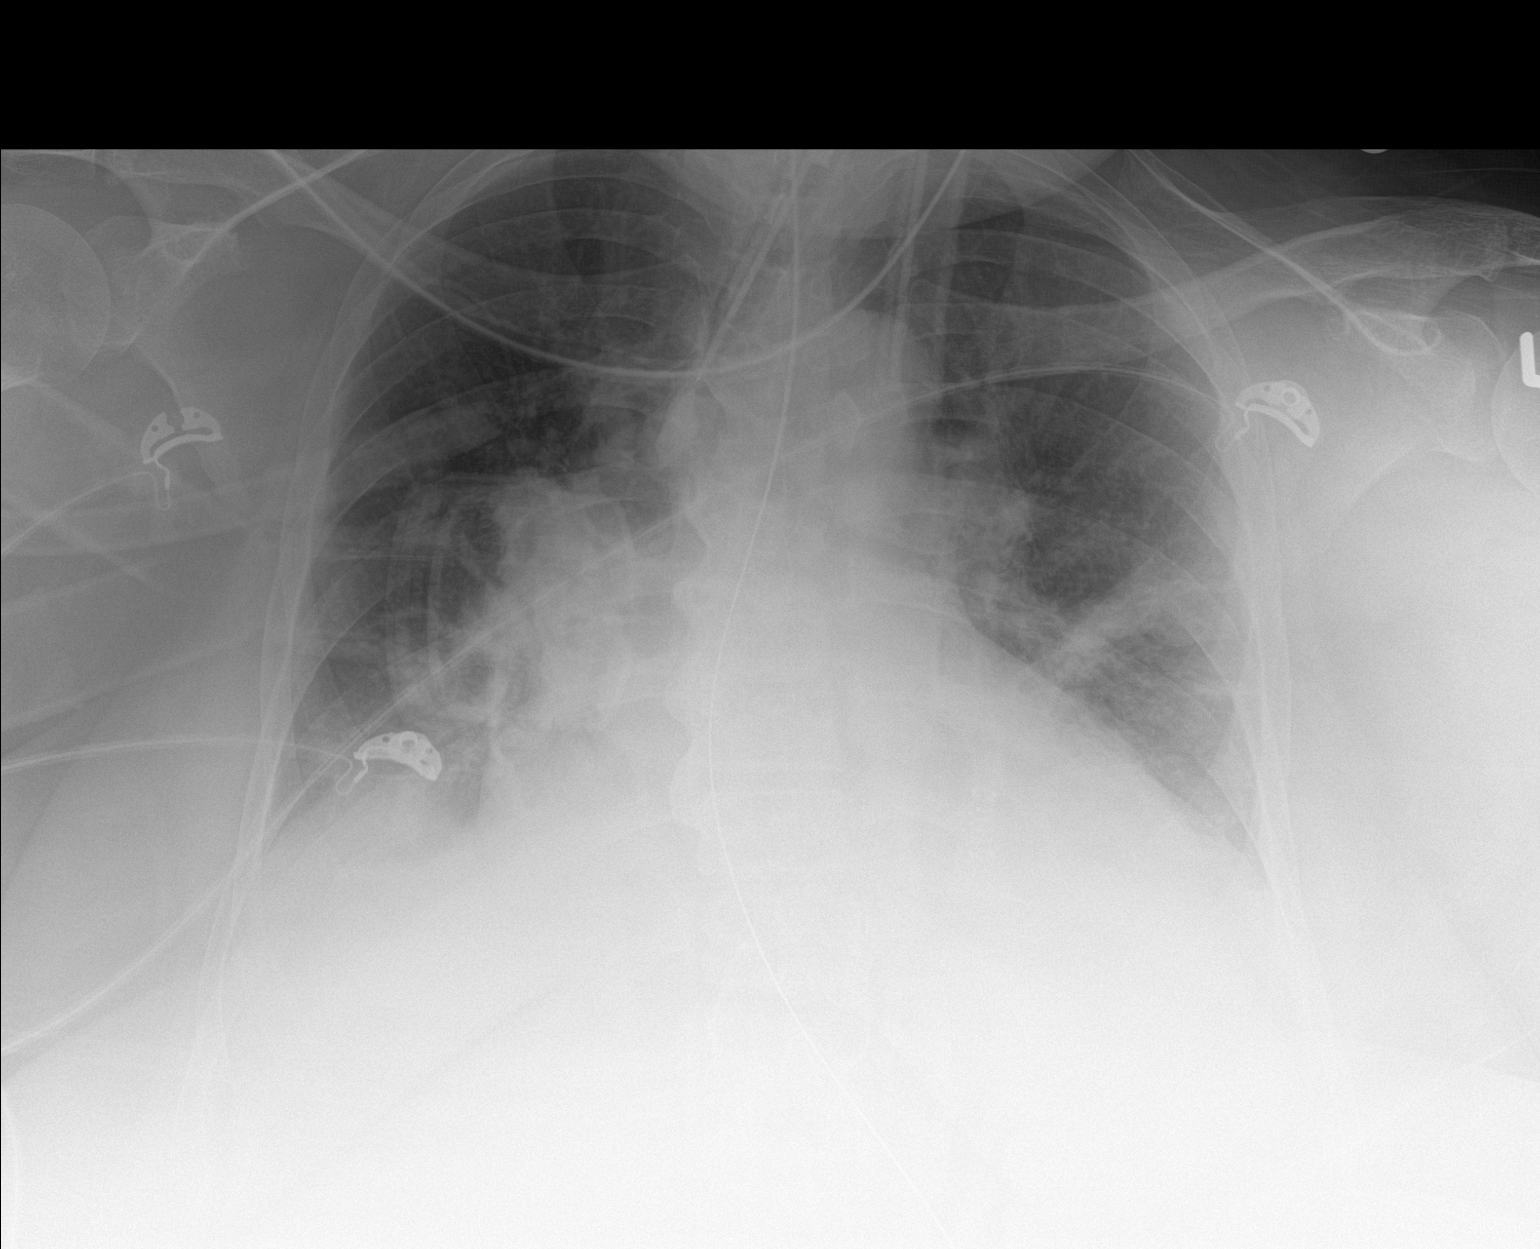

[1 of 1 positions shown; findings below may reference images not displayed]

FINDINGS: Chronic enlargement of the cardiac silhouette. Endotracheal tube tip
1 cm above the carina. Nasogastric tube enters the abdomen.
Persistent atelectasis/ infiltrate in both lower lobes with small
effusions. No change since yesterday.
IMPRESSION: No change. Persistent infiltrate/ atelectasis in both lower lungs
with associated effusions.

## 2019-03-27 IMAGING — DX DG CHEST 1V PORT
1 series · 1 of 1 positions shown · non-contrast
Comparison: 07/20/2016

CLINICAL DATA: Community-acquired pneumonia.  Short of breath

EXAM:
PORTABLE CHEST 1 VIEW

[chest ap]
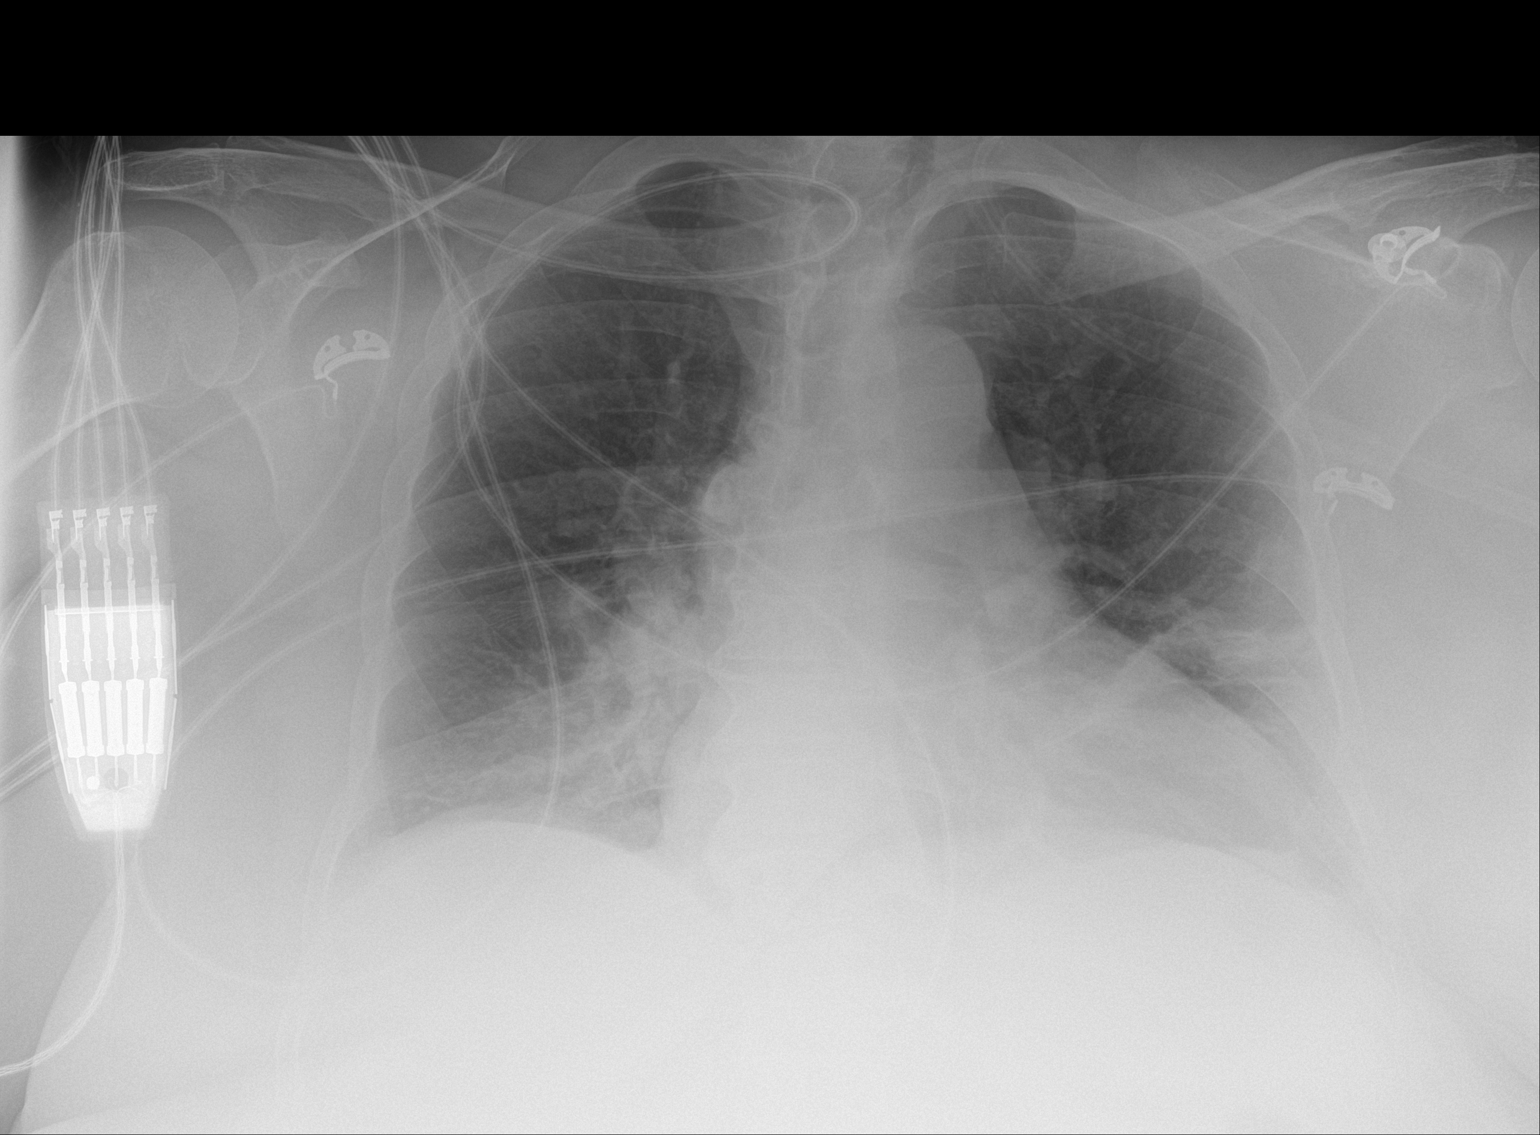

[1 of 1 positions shown; findings below may reference images not displayed]

FINDINGS: Improved aeration in the lung bases with decrease in bibasilar
airspace disease. No effusion. Negative for edema.
IMPRESSION: Bibasilar atelectasis/infiltrate with interval improvement from
yesterday

## 2019-04-09 ENCOUNTER — Ambulatory Visit (INDEPENDENT_AMBULATORY_CARE_PROVIDER_SITE_OTHER): Payer: Medicaid Other | Admitting: Podiatry

## 2019-04-09 ENCOUNTER — Other Ambulatory Visit: Payer: Self-pay

## 2019-04-09 ENCOUNTER — Encounter: Payer: Self-pay | Admitting: Podiatry

## 2019-04-09 ENCOUNTER — Ambulatory Visit: Payer: Medicaid Other

## 2019-04-09 DIAGNOSIS — L600 Ingrowing nail: Secondary | ICD-10-CM | POA: Diagnosis not present

## 2019-04-09 DIAGNOSIS — M722 Plantar fascial fibromatosis: Secondary | ICD-10-CM

## 2019-04-09 DIAGNOSIS — M79671 Pain in right foot: Secondary | ICD-10-CM

## 2019-04-09 NOTE — Progress Notes (Signed)
Subjective:   Patient ID: Michele Owens, female   DOB: 58 y.o.   MRN: [id number]   HPI Patient presents with chronic heel pain bilateral that she needs medication for an ingrown toenail deformity left over right that is been sore and making it hard for her to wear shoe gear with   ROS      Objective:  Physical Exam  Acute inflammation plantar heel region bilateral with fluid buildup and incurvated ingrown toenail deformity fourth left lateral border with thickness of the nail     Assessment:  Acute plantar fasciitis bilateral with ingrown toenail component left     Plan:  H&P reviewed both conditions sterile prep and injected the plantar fascial bilateral 3 mg Kenalog 5 mg Xylocaine and did discuss the ingrown to discuss possible surgical intervention for this but at this point I went ahead and carefully remove the corner to take pressure off it and she will be seen back as needed and I educated her on ingrown toenail for treatment options

## 2019-04-29 IMAGING — DX DG CHEST 2V
2 series · 2 of 2 positions shown · non-contrast
Comparison: 07/21/2016

CLINICAL DATA: Follow-up pneumonia

EXAM:
CHEST  2 VIEW

[chest pa]
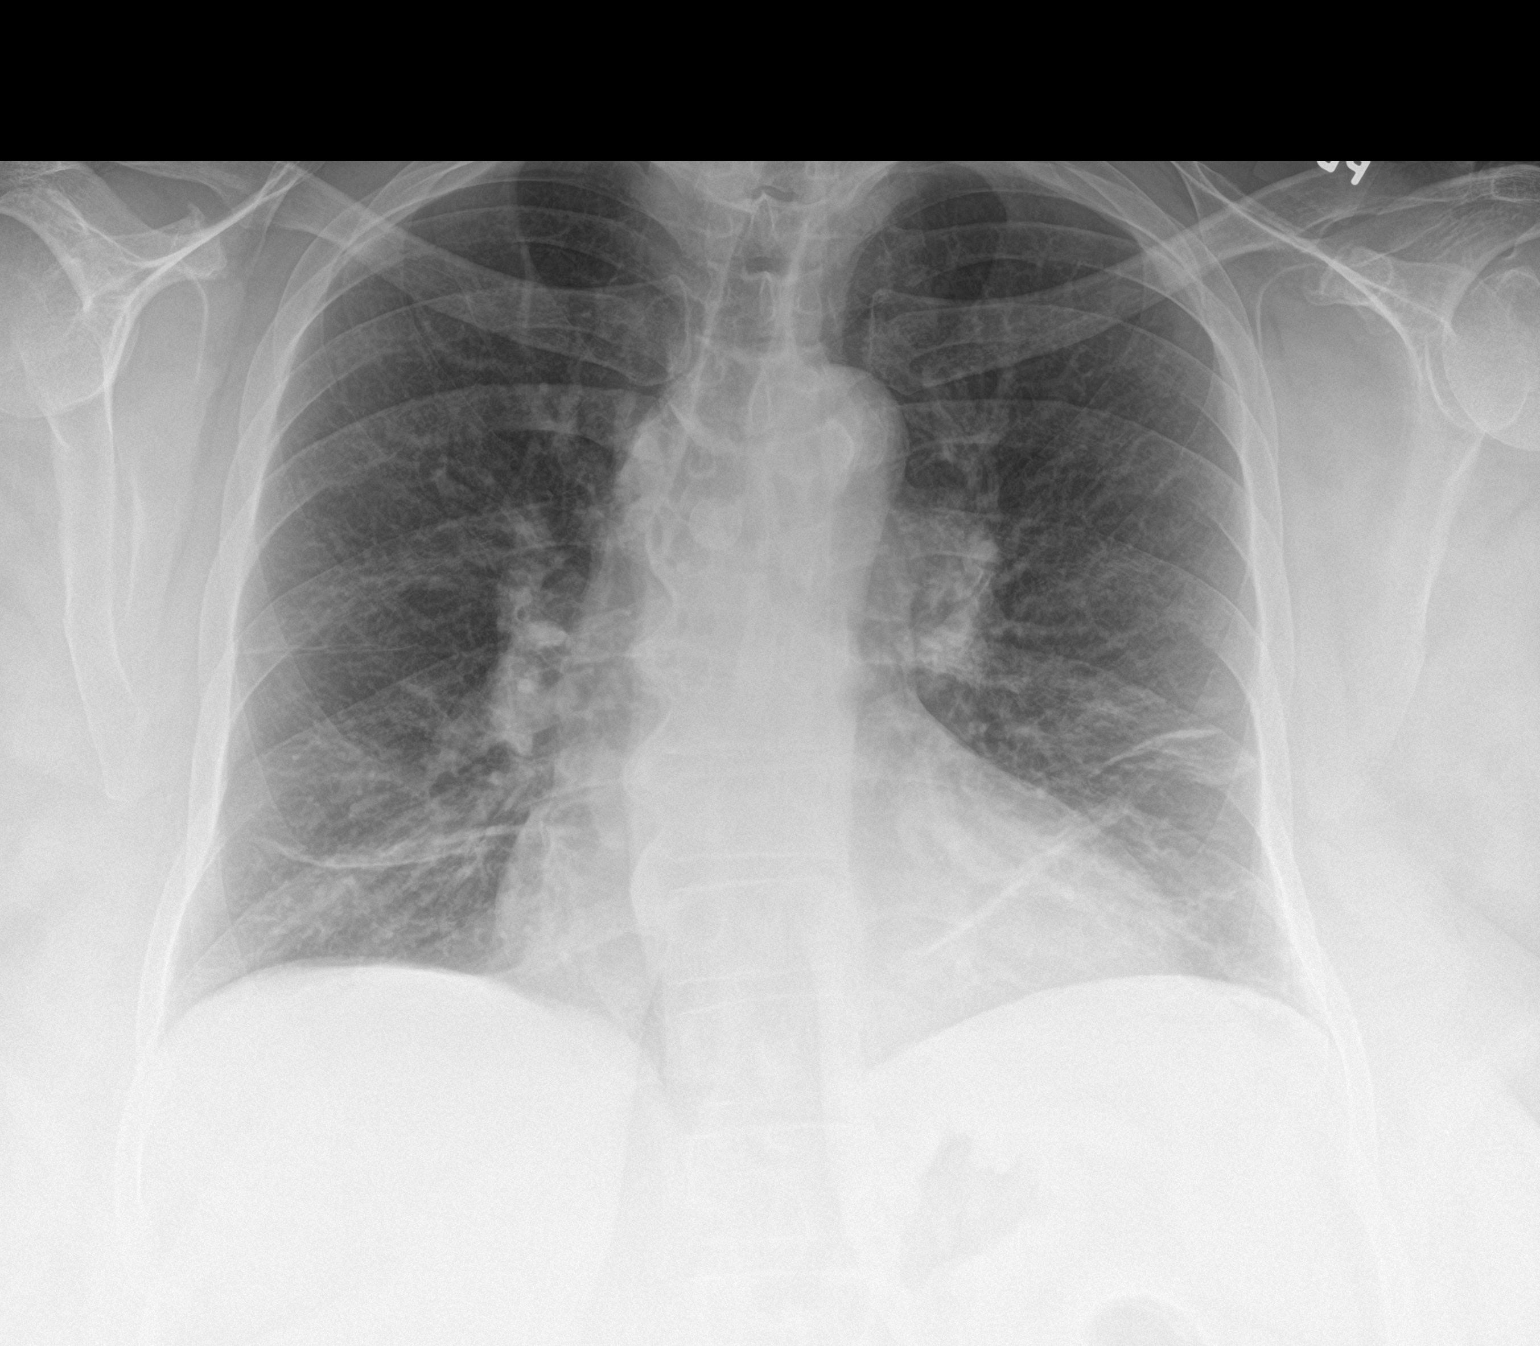

[chest lat]
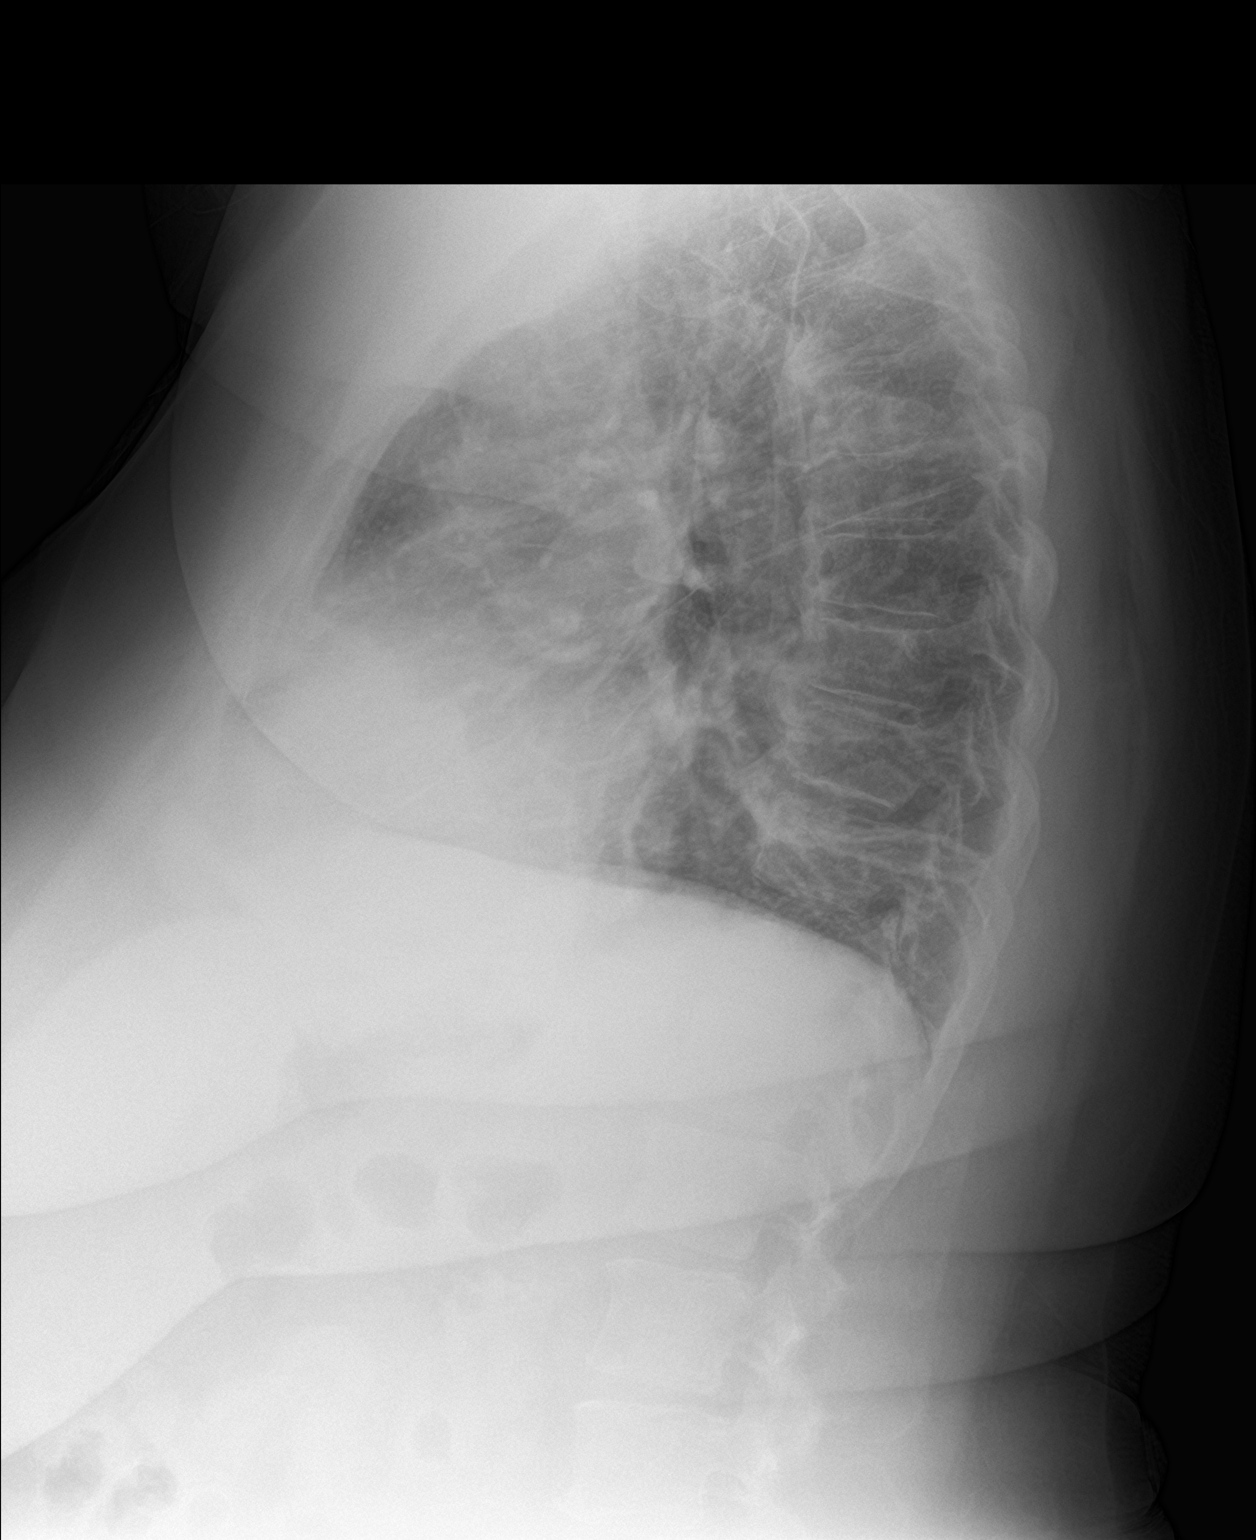

[2 of 2 positions shown; findings below may reference images not displayed]

FINDINGS: Cardiac shadow is mildly enlarged. Linear scarring is noted in the
bases bilaterally. No focal infiltrate or sizable effusion is seen.
No acute bony abnormality is noted.
IMPRESSION: Linear scarring in the bases bilaterally

## 2019-05-16 ENCOUNTER — Ambulatory Visit: Payer: Medicaid Other | Admitting: Podiatry

## 2019-05-19 ENCOUNTER — Telehealth (HOSPITAL_COMMUNITY): Payer: Self-pay | Admitting: Licensed Clinical Social Worker

## 2019-05-19 NOTE — Telephone Encounter (Signed)
CSW calling pt to check in as she is with [employer] but has not been seen in some time.  CSW reports that she is doing ok but would like for a new paramedic to come out as her previous paramedic, Demetria, no longer works for [employer].  [place] updated paramedic team who will follow up with patient directly regarding home visit.  CSW will continue to follow and assist as needed  Jorge Ny, [facility] Desk#: [phone] Cell#: [phone]

## 2019-05-20 ENCOUNTER — Telehealth: Payer: Self-pay | Admitting: Podiatry

## 2019-05-20 ENCOUNTER — Telehealth (HOSPITAL_COMMUNITY): Payer: Self-pay

## 2019-05-20 NOTE — Telephone Encounter (Signed)
Patient canceled appointment on 2/5 with me.

## 2019-05-20 NOTE — Telephone Encounter (Signed)
Spoke to [place] who confirmed she does not need pill box filled but would like to be followed monthly. We agreed for home visit on Feb. 16th at 0900. Call completed.

## 2019-05-20 NOTE — Telephone Encounter (Signed)
Pt left message yesterday on nurse line stating she needed an appt to talk to Dr Roselind Messier.She said she does not understand why she cannot get her diabetic shoes from our office. She referred her mother here and she can get them why can I not get them. She needs to see Dr Milinda Pointer for something important that is why she needs and appt with Dr Sherren Mocha.   I called back and the phone is not in service at this time. Pt was scheduled to see Dr Elisha Ponder for her routine foot care on 2.5 but cxled it.

## 2019-05-21 NOTE — Telephone Encounter (Signed)
She cannot get them because she is medicaid.  Call her and explain it please. I think you understand this better than anyone.

## 2019-05-30 IMAGING — CT CT CHEST W/ CM
2 of 3 series · 15 of 36 positions shown, 18 images · IV contrast (Omni 300)
Comparison: Chest radiograph performed earlier today at [DATE] a.m.,
and CT of the chest performed 01/23/2016

CLINICAL DATA: Acute onset of generalized abdominal cramping and
weakness. Left-sided chest pain and intermittent shortness of
breath. Recent thyroid surgery.

EXAM:
CT CHEST WITH CONTRAST
TECHNIQUE: Multidetector CT imaging of the chest was performed during
intravenous contrast administration.
CONTRAST:  75 mL of Isovue 300 IV contrast

[Series 3: chest with 2mm st · axial · 0.79mm/px · z∈[+1208,+1452]mm · 12 of 144 slices shown, 15 images]
[im 11/144  mediastinal]
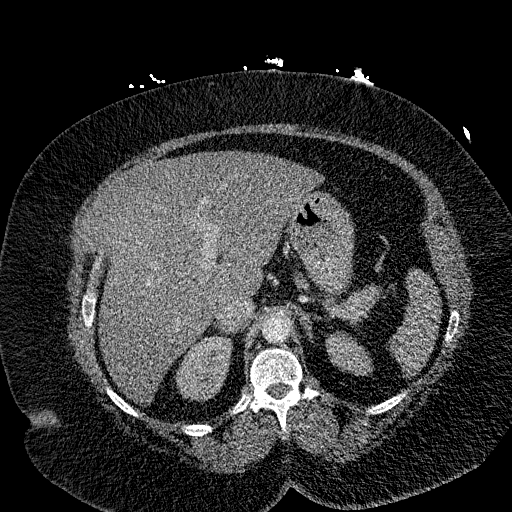
[im 11/144  lung]
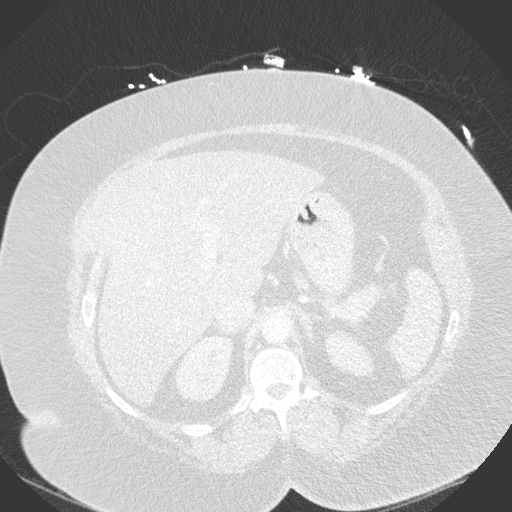
[im 22/144  lung]
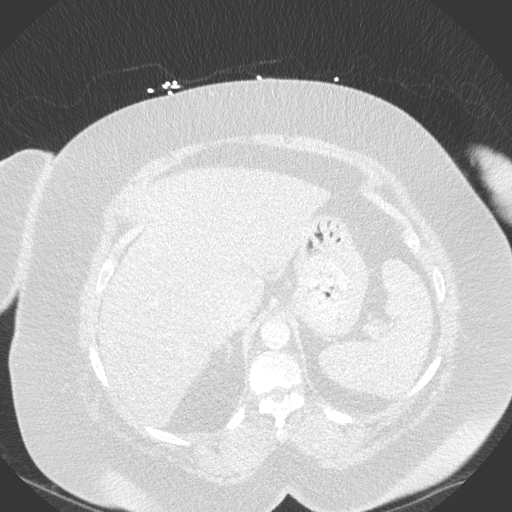
[im 32/144  lung]
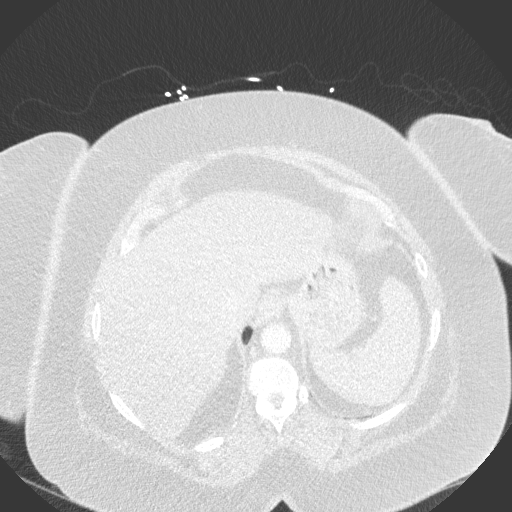
[im 43/144  lung]
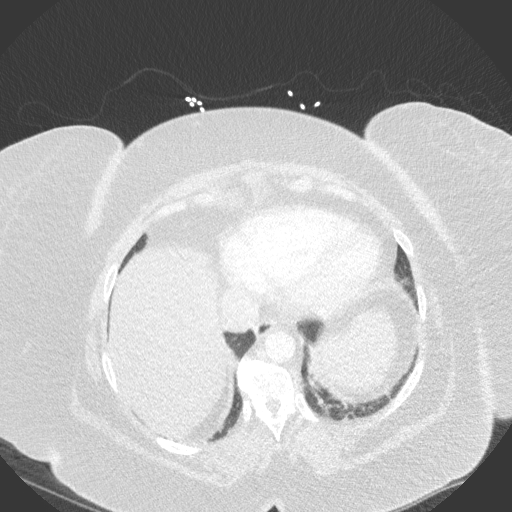
[im 53/144  mediastinal]
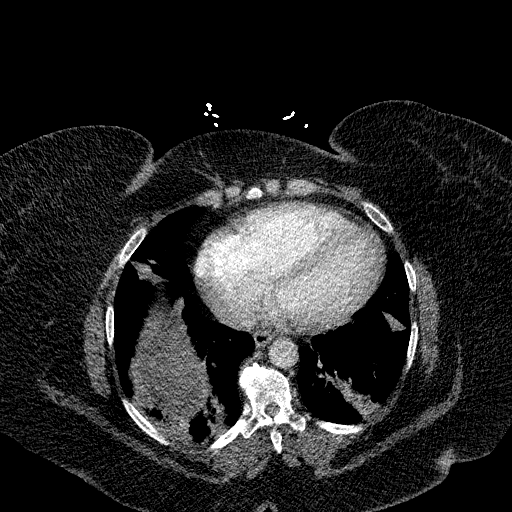
[im 53/144  lung]
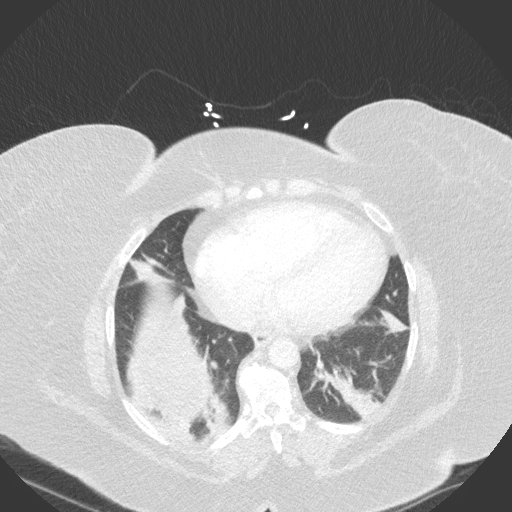
[im 64/144  lung]
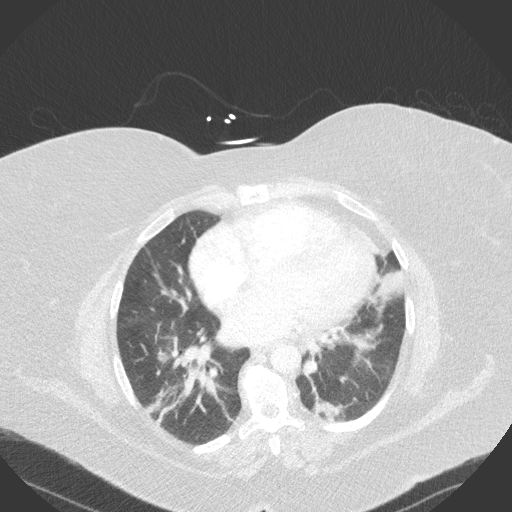
[im 80/144  lung]
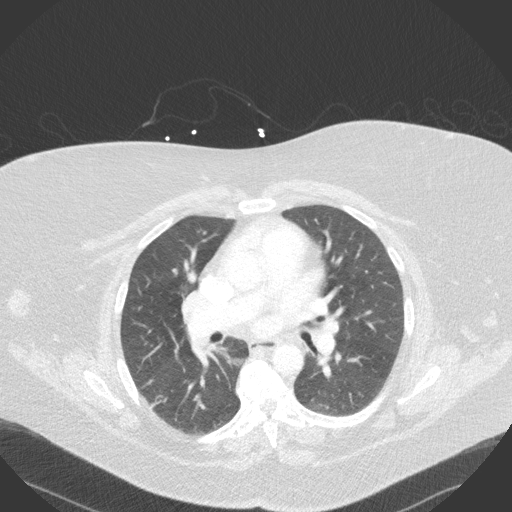
[im 91/144  lung]
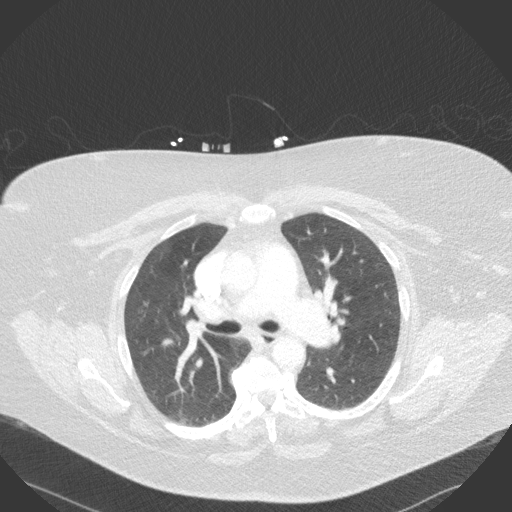
[im 101/144  mediastinal]
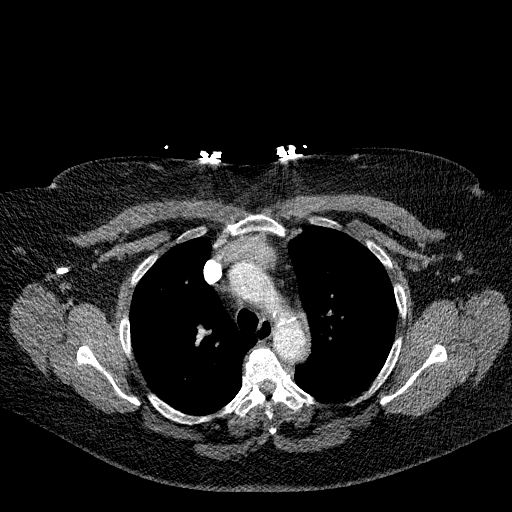
[im 101/144  lung]
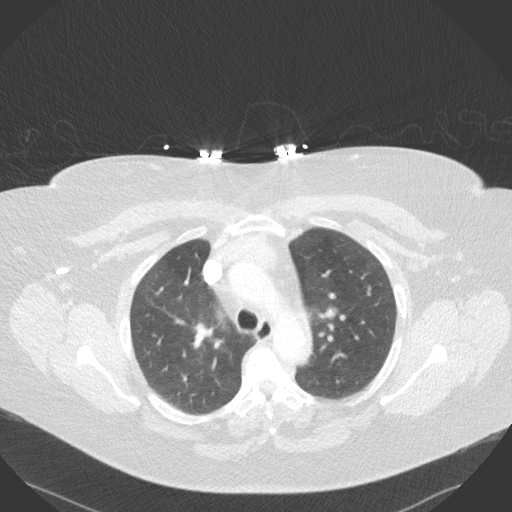
[im 112/144  lung]
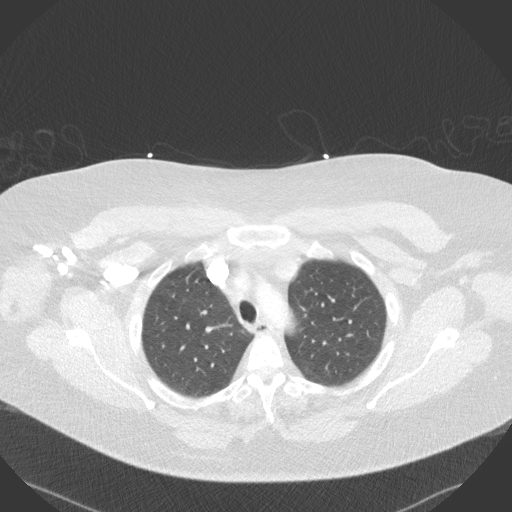
[im 122/144  lung]
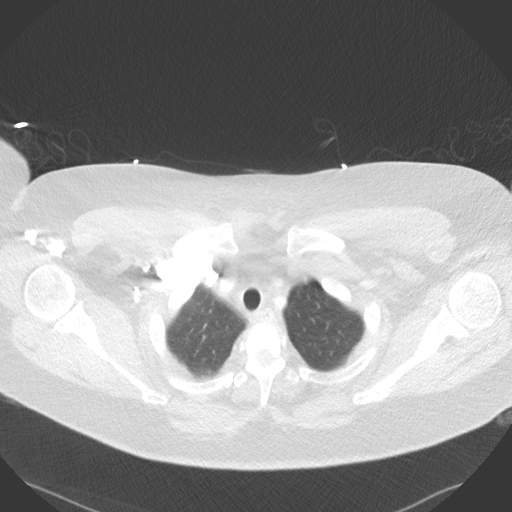
[im 133/144  lung]
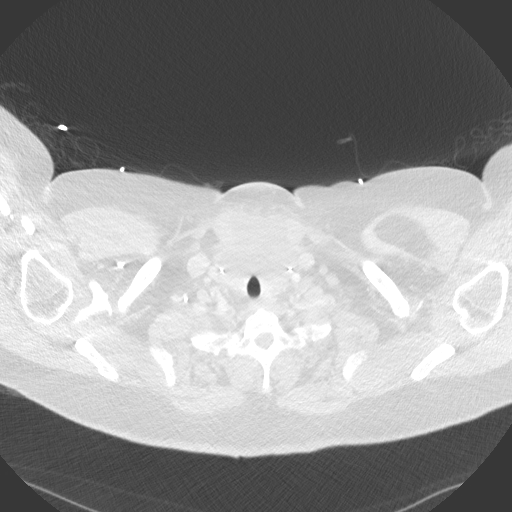

[Series 5: chest with 3mm st cor · coronal · 0.59mm/px · 3 of 84 slices shown]
[im 17/84  lung]
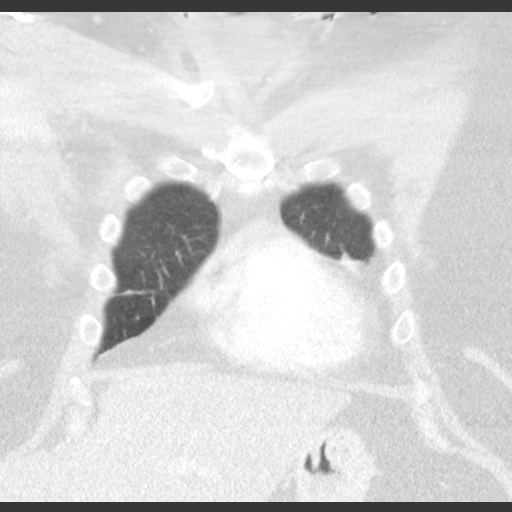
[im 34/84  lung]
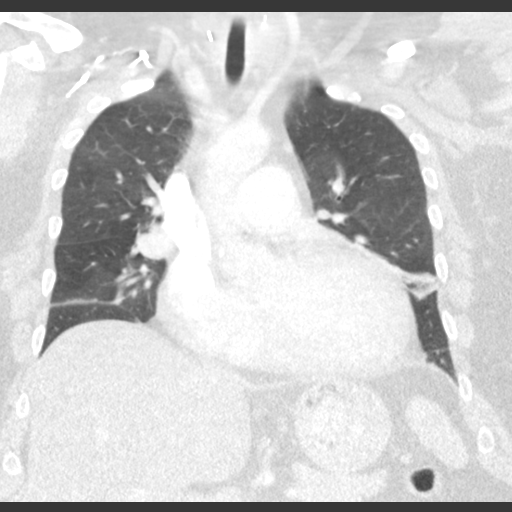
[im 50/84  lung]
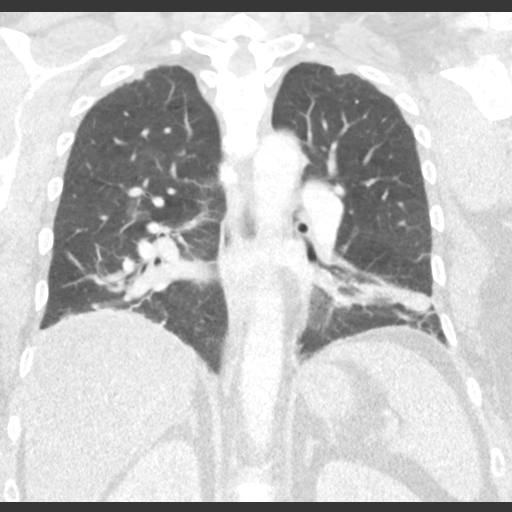

[15 of 36 positions shown; findings below may reference images not displayed]

FINDINGS: Cardiovascular: The heart is borderline enlarged. Minimal
calcification is seen at the aortic arch. The great vessels are
within normal limits.

Mediastinum/Nodes: The mediastinum is otherwise unremarkable in
appearance. No mediastinal lymphadenopathy is seen. No pericardial
effusion is identified. Prominent soft tissue edema is noted about
the thyroid bed, reflecting recent thyroidectomy.

No axillary lymphadenopathy is appreciated.

Lungs/Pleura: Patchy bibasilar airspace opacities most likely
reflect atelectasis, though infection might conceivably have a
similar appearance. No pleural effusion or pneumothorax is seen. An
apparent 7 mm nodule is noted at the medial aspect of the right lung
base (image 78 of 144). This is less prominent than in 6909 and may
simply reflect atelectasis.

Upper Abdomen: The visualized portions of the liver and spleen are
grossly unremarkable. The visualized portions of the pancreas,
adrenal glands and kidneys are within normal limits.

Musculoskeletal: No acute osseous abnormalities are identified.
Anterior bridging osteophytes are noted along the lower thoracic
spine. The visualized musculature is unremarkable in appearance.
IMPRESSION: 1. Patchy bibasilar airspace opacities most likely reflect
atelectasis, though infection might have a similar appearance,
depending on the patient's symptoms.
2. Borderline cardiomegaly.
3. Prominent soft tissue edema about the thyroid bed, reflecting
recent thyroidectomy.

## 2019-05-30 IMAGING — NM NM PULMONARY VENT & PERF
12 series · 12 of 12 positions shown · non-contrast
Comparison: Chest CT 09/23/2016.  Chest radiograph 09/23/2016

CLINICAL DATA: Shortness of breath and leg cramps. Recent
thyroidectomy.

EXAM:
NUCLEAR MEDICINE VENTILATION - PERFUSION LUNG SCAN
TECHNIQUE: Ventilation images were obtained in multiple projections using
inhaled aerosol Kc-AAm DTPA. Perfusion images were obtained in
multiple projections after intravenous injection of Kc-AAm MAA.
RADIOPHARMACEUTICALS:  31.8 mCi Eechnetium-GGm DTPA aerosol
inhalation and 4.2 mCi Eechnetium-GGm MAA IV

[Series 1: ant/post vent · 4.14mm/px · 1 of 1 slices shown (1 of 2)]
[im 1/1]
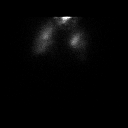

[Series 1: ant/post vent · 4.14mm/px · 1 of 1 slices shown (2 of 2)]
[im 1/1]
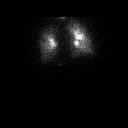

[Series 2: lao/rpo vent · 4.14mm/px · 1 of 1 slices shown (1 of 2)]
[im 1/1]
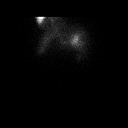

[Series 2: lao/rpo vent · 4.14mm/px · 1 of 1 slices shown (2 of 2)]
[im 1/1]
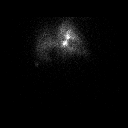

[Series 3: lpo/rao vent · 4.14mm/px · 1 of 1 slices shown (1 of 2)]
[im 1/1]
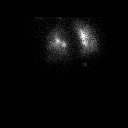

[Series 3: lpo/rao vent · 4.14mm/px · 1 of 1 slices shown (2 of 2)]
[im 1/1]
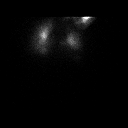

[Series 4: lpo/rao perf · 4.14mm/px · 1 of 1 slices shown (1 of 2)]
[im 1/1]
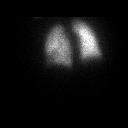

[Series 4: lpo/rao perf · 4.14mm/px · 1 of 1 slices shown (2 of 2)]
[im 1/1]
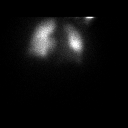

[Series 5: ant/post perf · 4.14mm/px · 1 of 1 slices shown (1 of 2)]
[im 1/1]
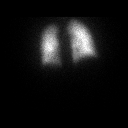

[Series 5: ant/post perf · 4.14mm/px · 1 of 1 slices shown (2 of 2)]
[im 1/1]
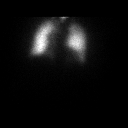

[Series 6: lao/rpo perf · 4.14mm/px · 1 of 1 slices shown (1 of 2)]
[im 1/1]
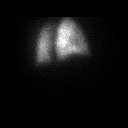

[Series 6: lao/rpo perf · 4.14mm/px · 1 of 1 slices shown (2 of 2)]
[im 1/1]
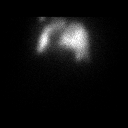

[12 of 12 positions shown; findings below may reference images not displayed]

FINDINGS: Ventilation: Patchy uptake of the radiopharmaceutical in the central
airways.

Perfusion: Patient did not tolerate the procedure well and lateral
views could not be obtained. However, perfusion images are
essentially normal on the other views. There are no large peripheral
perfusion abnormalities.
IMPRESSION: Study has technical limitations due to incomplete imaging as
described. However, the obtained perfusion images are essentially
normal. Very low probability for a pulmonary embolism.

## 2019-05-30 IMAGING — DX DG CHEST 2V
2 series · 2 of 2 positions shown · non-contrast
Comparison: 08/23/2016, 07/21/2016, 07/20/2016

CLINICAL DATA: Chest pain

EXAM:
CHEST  2 VIEW

[chest lat]
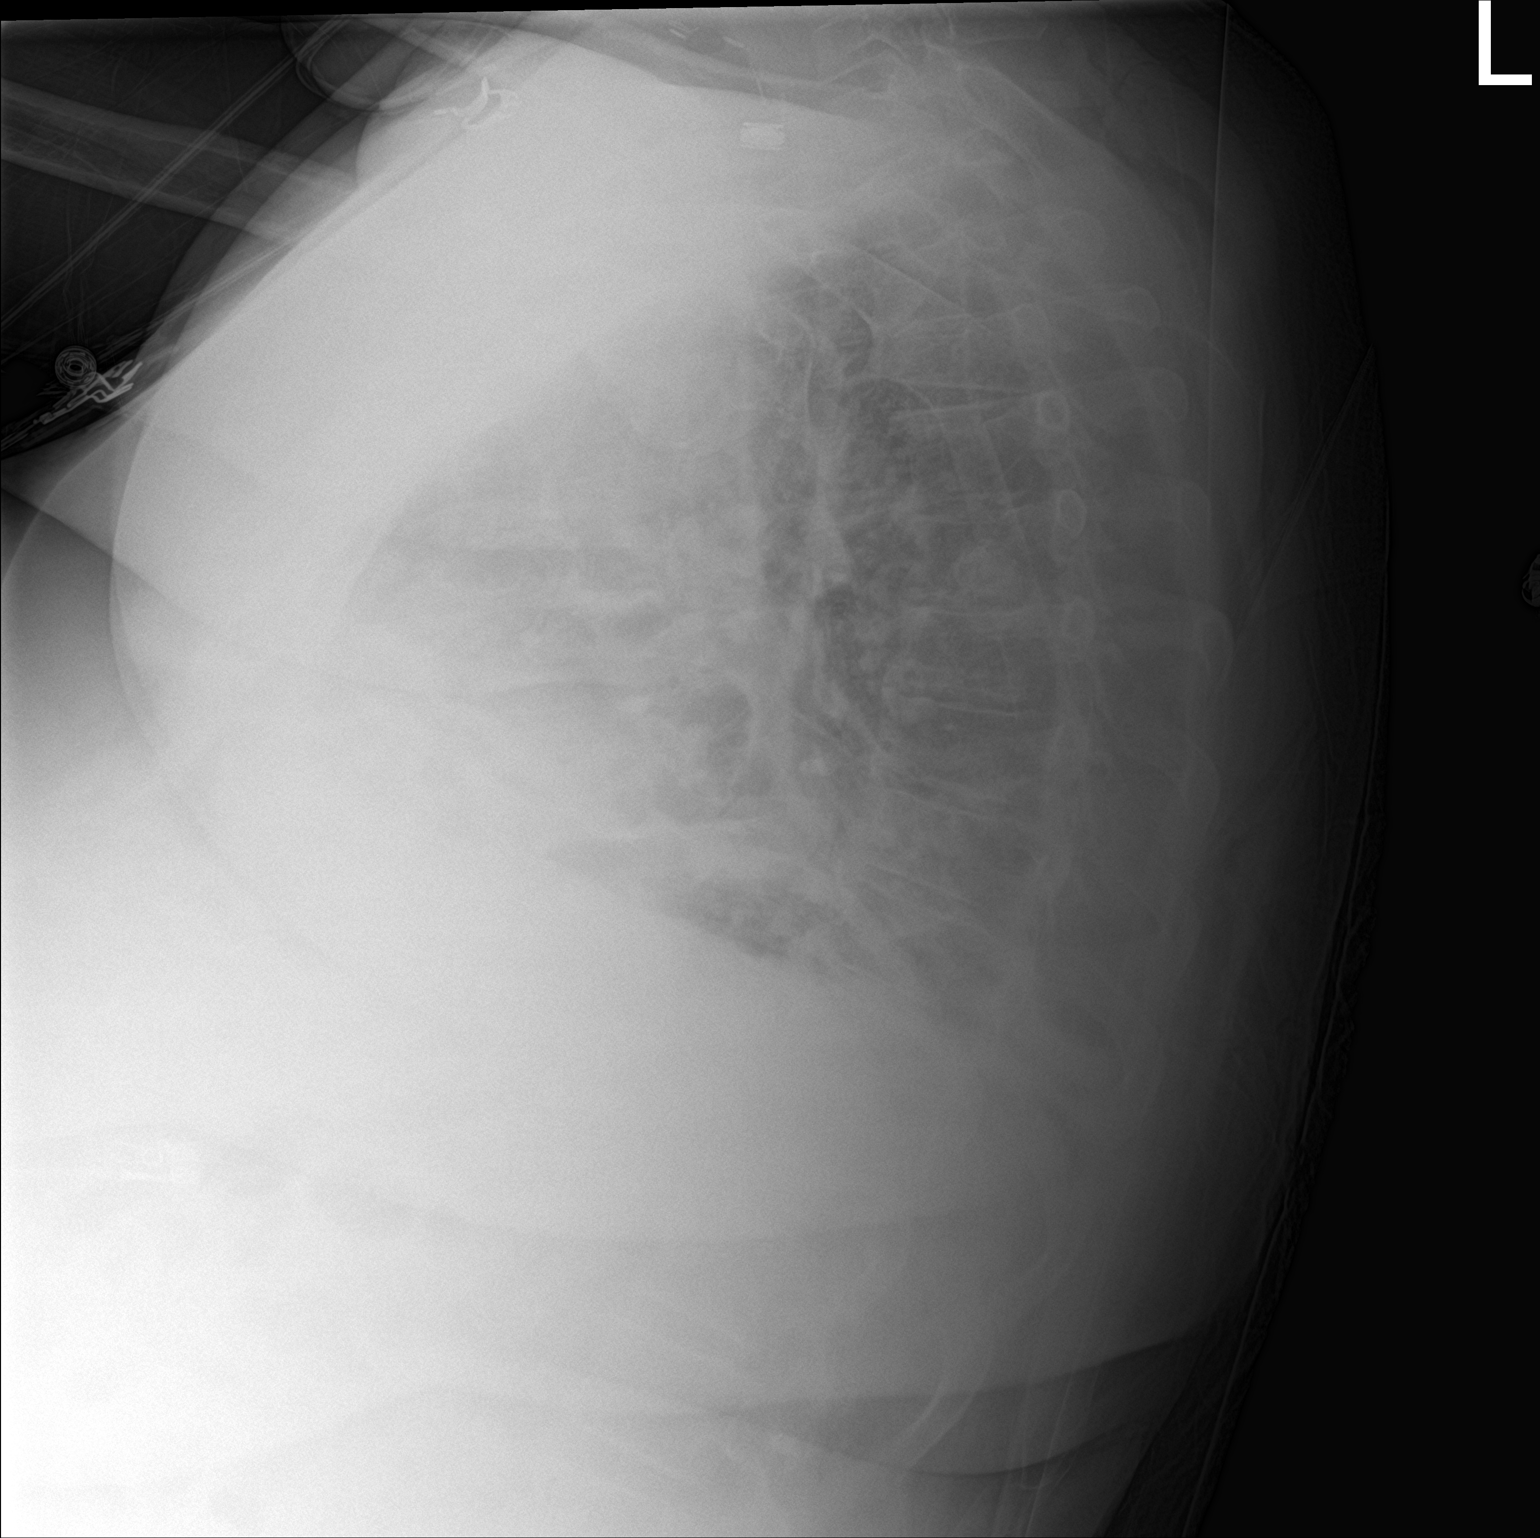

[chest ap]
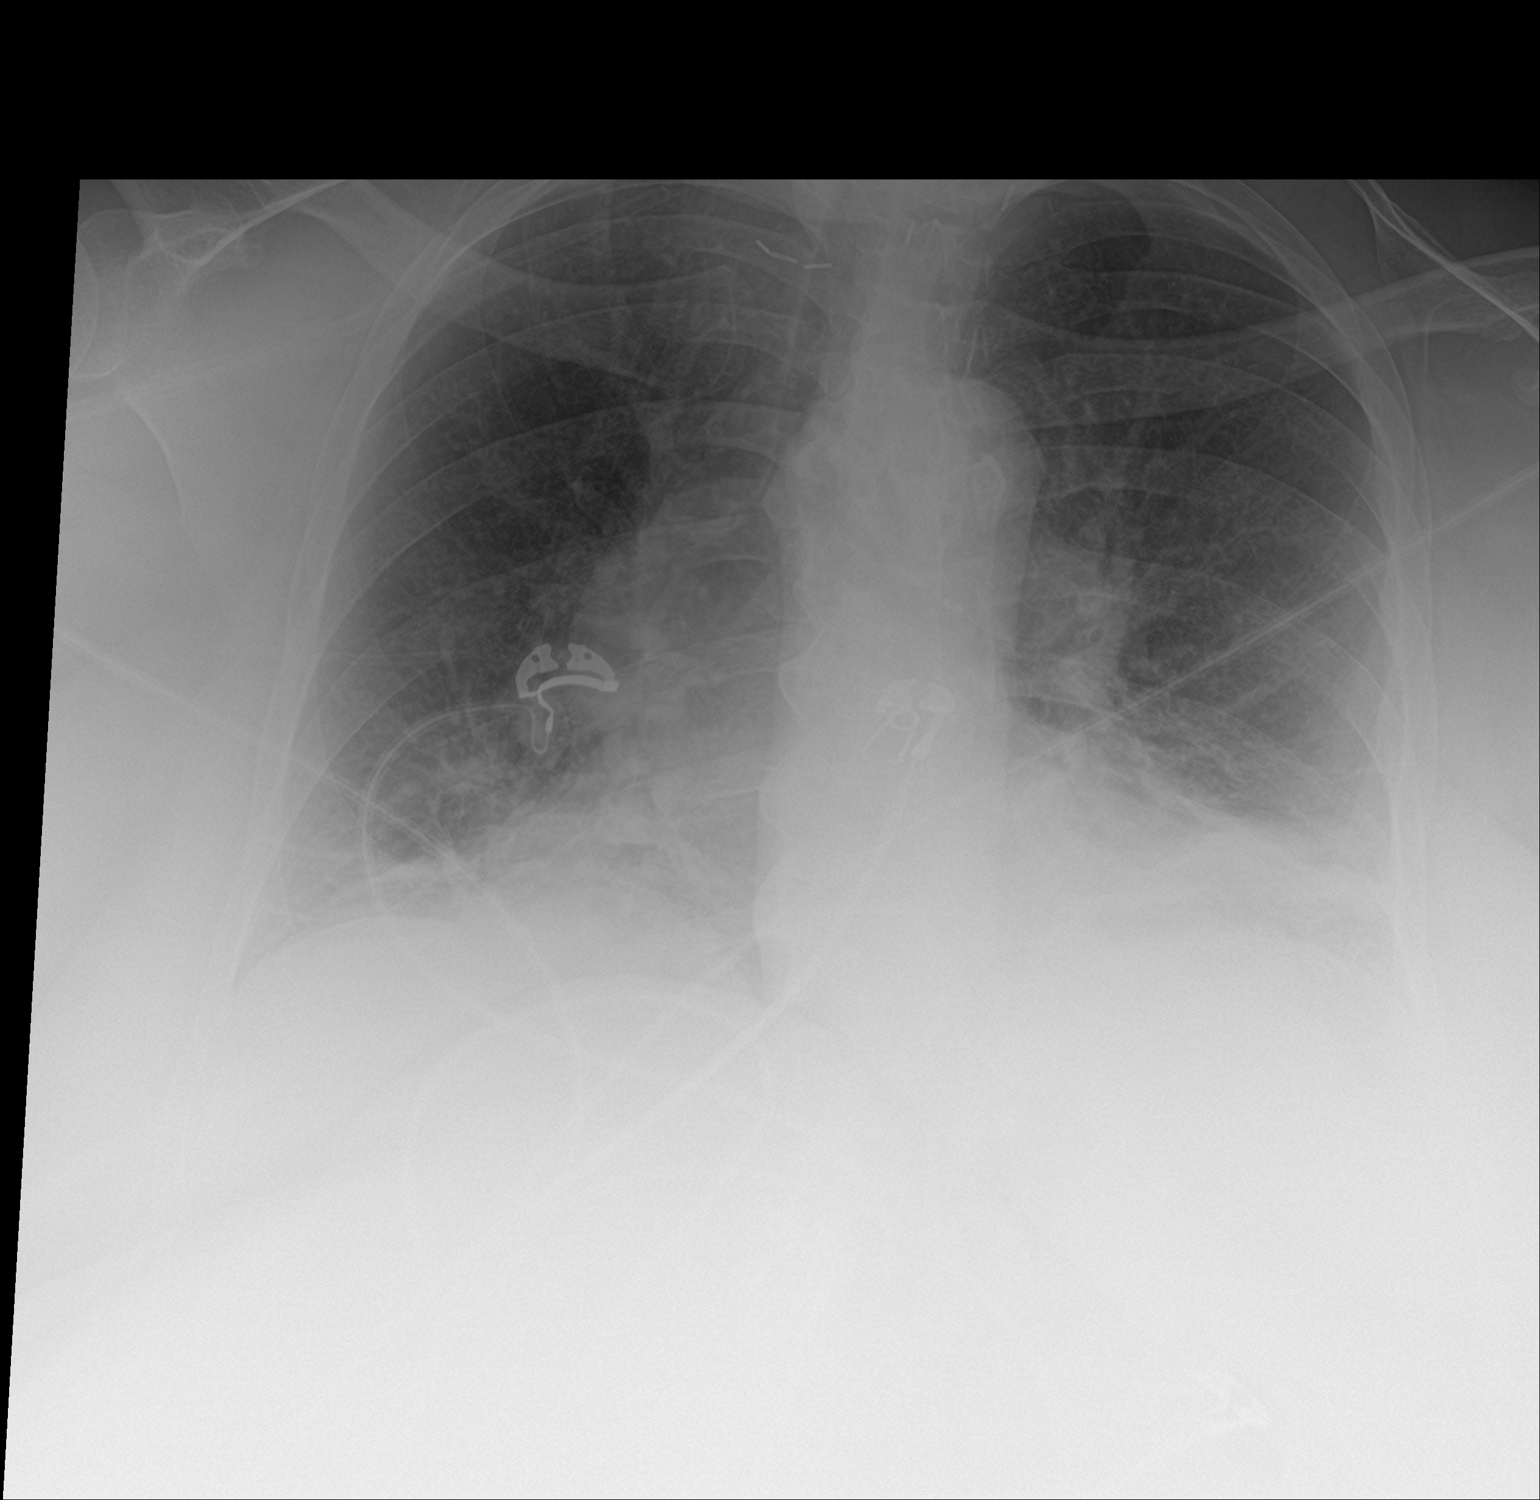

[2 of 2 positions shown; findings below may reference images not displayed]

FINDINGS: Cardiomegaly. Development of bibasilar atelectasis or pneumonia.
Possible small effusions. Interim finding of a right convex hilar
enlargement. No pneumothorax. Surgical clips at the thoracic inlet.
IMPRESSION: 1. Cardiomegaly with development of bibasilar atelectasis or
pneumonia
2. Interval finding of convex right hilar enlargement ; uncertain if
this is due to enlarged hilar vessels, vascular abnormality, or
mass. CT is recommended for further evaluation.
3. Interval postsurgical changes at the thoracic inlet.

## 2019-06-02 ENCOUNTER — Telehealth (HOSPITAL_COMMUNITY): Payer: Self-pay

## 2019-06-02 NOTE — Telephone Encounter (Signed)
Made in error

## 2019-06-02 NOTE — Telephone Encounter (Signed)
Spoke to Michele Owens who was very concerned with why I was not able to come out to visit her last week and I made her aware of a change in schedule and she was very upset stating she needed verification from her doctor for who sent me to come out to see her. I advised her the heart failure clinic suggested I come out to see her to assist in manage medicines. She was very direct in that she wanted to speak to her doctor before I came out to see her. I provided her with HF clinic front desk number for her to call. I will continue to follow. Eliezer Lofts [place] updated.

## 2019-06-03 ENCOUNTER — Telehealth (HOSPITAL_COMMUNITY): Payer: Self-pay | Admitting: Licensed Clinical Social Worker

## 2019-06-03 NOTE — Telephone Encounter (Signed)
CSW informed by [facility] paramedic that pt is not willing to see her until she hears from MD office confirming that MD office is who is wanting the paramedic to come out.  CSW called pt to discuss and reiterated multiple times that the [employer] is coming out on the orders of Dr. Aundra Dubin- pt became agreeable to having paramedic come out and requests she come on Thursday- paramedic informed and will follow up with patient regarding timing.  CSW will continue to follow and assist as needed  Jorge Ny, [facility] Desk#: [phone] Cell#: [phone]

## 2019-06-05 ENCOUNTER — Ambulatory Visit (INDEPENDENT_AMBULATORY_CARE_PROVIDER_SITE_OTHER): Payer: Medicaid Other | Admitting: Podiatry

## 2019-06-05 ENCOUNTER — Other Ambulatory Visit: Payer: Self-pay

## 2019-06-05 ENCOUNTER — Other Ambulatory Visit (HOSPITAL_COMMUNITY): Payer: Self-pay

## 2019-06-05 DIAGNOSIS — M79674 Pain in right toe(s): Secondary | ICD-10-CM

## 2019-06-05 DIAGNOSIS — M79675 Pain in left toe(s): Secondary | ICD-10-CM | POA: Diagnosis not present

## 2019-06-05 DIAGNOSIS — G5752 Tarsal tunnel syndrome, left lower limb: Secondary | ICD-10-CM | POA: Diagnosis not present

## 2019-06-05 DIAGNOSIS — G5792 Unspecified mononeuropathy of left lower limb: Secondary | ICD-10-CM

## 2019-06-05 DIAGNOSIS — E1142 Type 2 diabetes mellitus with diabetic polyneuropathy: Secondary | ICD-10-CM

## 2019-06-05 DIAGNOSIS — B351 Tinea unguium: Secondary | ICD-10-CM | POA: Diagnosis not present

## 2019-06-05 NOTE — Progress Notes (Signed)
Paramedicine Encounter    Patient ID: Katelind Pytel, female    DOB: [birth year], 59 y.o.   MRN: [id number]   Patient Care Team: Patient, No Pcp Per as PCP - General (General Practice) Sharmon Revere as Referring Physician (Physician Assistant)  Patient Active Problem List   Diagnosis Date Noted  . Morbid obesity with BMI of 50.0-59.9, adult ([place])   . Fall at home, initial encounter 06/23/2018  . HNP (herniated nucleus pulposus), thoracic 06/23/2018  . CHF (congestive heart failure) ([place]) 06/18/2018  . Hypertensive disorder 08/28/2017  . Obesity 08/28/2017  . COPD exacerbation ([place]) 08/04/2017  . Acute and chronic respiratory failure with hypercapnia ([place])   . Pulmonary HTN ([place])   . Genetic testing 02/27/2017  . Family history of thyroid cancer   . Hypomagnesemia 10/03/2016  . Atypical chest pain   . HLD (hyperlipidemia) 09/23/2016  . Gout 09/23/2016  . DVT (deep vein thrombosis) in pregnancy 09/23/2016  . Thyroid cancer ([place]) 09/23/2016  . Hypocalcemia 09/23/2016  . AKI (acute kidney injury) ([place]) 09/23/2016  . Acute pulmonary edema ([facility])   . Acute hypoxemic respiratory failure ([place])   . Ventilator dependent ([place])   . Respiratory failure ([place]) 07/16/2016  . CAP (community acquired pneumonia) 01/23/2016  . 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 Pulmonary hypertension ([place]) 01/23/2016  . Obesity hypoventilation syndrome ([place]) 08/26/2015  . Dyslipidemia associated with type 2 diabetes mellitus ([place]) 04/24/2015  . Leukocytosis 04/24/2015  . Controlled type 2 diabetes mellitus without complication, without long-term current use of insulin ([place]) 04/24/2015  . Anxiety 04/24/2015  . Benign essential HTN 04/24/2015  . Normocytic anemia 04/24/2015  . OSA (obstructive sleep apnea) 05/10/2012  . Tobacco abuse 07/04/2011    Current Outpatient Medications:  .  albuterol (PROVENTIL HFA;VENTOLIN HFA) 108 (90 Base) MCG/ACT inhaler, Inhale 1-2 puffs  into the lungs every 6 (six) hours as needed for wheezing or shortness of breath., Disp: 1 Inhaler, Rfl: 0 .  allopurinol (ZYLOPRIM) 100 MG tablet, Take 100 mg by mouth daily., Disp: , Rfl:  .  ANORO ELLIPTA 62.5-25 MCG/INH AEPB, Inhale 1 puff into the lungs daily. , Disp: , Rfl:  .  Blood Glucose Monitoring Suppl (ACCU-CHEK AVIVA PLUS) w/Device KIT, USE AS DIRECTED TO CHECK BLOOD SUGAR, Disp: , Rfl: 0 .  butalbital-acetaminophen-caffeine (FIORICET, ESGIC) 50-325-40 MG tablet, Take 1-2 tablets by mouth every 4 (four) hours as needed for migraine., Disp: , Rfl:  .  calcitRIOL (ROCALTROL) 0.5 MCG capsule, Take 1 capsule (0.5 mcg total) by mouth daily., Disp: 30 capsule, Rfl: 0 .  carvedilol (COREG) 6.25 MG tablet, Take 1 tablet (6.25 mg total) by mouth 2 (two) times daily with a meal., Disp: 60 tablet, Rfl: 0 .  cetirizine (ZYRTEC) 10 MG tablet, Take 10 mg by mouth daily., Disp: , Rfl: 2 .  diclofenac sodium (VOLTAREN) 1 % GEL, Apply 2 g topically 4 (four) times daily as needed (for pain)., Disp: 1 Tube, Rfl: 0 .  FEROSUL 325 (65 Fe) MG tablet, Take 325 mg by mouth daily., Disp: , Rfl: 3 .  furosemide (LASIX) 20 MG tablet, Take 2 tablets (40 mg total) by mouth 2 (two) times daily. Take 4m every morning and 479mevery evening (Patient taking differently: Take 40 mg by mouth 2 (two) times daily. Take 402mvery morning and 49m10mery evening), Disp: 360 tablet, Rfl: 0 .  glucose blood test strip, Use as instructed to check blood sugar up to 4 times daily,  Disp: 100 each, Rfl: 0 .  HYDROcodone-acetaminophen (NORCO/VICODIN) 5-325 MG tablet, Take 1 tablet by mouth every 6 (six) hours as needed for moderate pain. , Disp: , Rfl: 0 .  ipratropium-albuterol (DUONEB) 0.5-2.5 (3) MG/3ML SOLN, Take 3 mLs by nebulization 4 (four) times daily. , Disp: , Rfl: 3 .  levothyroxine (SYNTHROID, LEVOTHROID) 112 MCG tablet, Take 224 mcg by mouth daily., Disp: , Rfl: 5 .  lidocaine (LIDODERM) 5 %, Place 1 patch onto the  skin daily. Remove & Discard patch within 12 hours or as directed by MD, Disp: 5 patch, Rfl: 0 .  lisinopril (PRINIVIL,ZESTRIL) 5 MG tablet, Take 1 tablet (5 mg total) by mouth daily. Please call for office visit [phone], Disp: 30 tablet, Rfl: 0 .  loperamide (IMODIUM) 2 MG capsule, Take 1 capsule (2 mg total) by mouth 4 (four) times daily as needed for diarrhea or loose stools., Disp: 12 capsule, Rfl: 0 .  metFORMIN (GLUCOPHAGE) 500 MG tablet, Take 1 tablet (500 mg total) by mouth 2 (two) times daily with a meal., Disp: 30 tablet, Rfl: 1 .  methocarbamol (ROBAXIN) 500 MG tablet, Take 1 tablet (500 mg total) by mouth every 8 (eight) hours as needed for muscle spasms., Disp: 20 tablet, Rfl: 0 .  metoCLOPramide (REGLAN) 10 MG tablet, Take 1 tablet (10 mg total) by mouth every 6 (six) hours as needed for nausea (or headache)., Disp: 30 tablet, Rfl: 0 .  metoprolol succinate (TOPROL-XL) 25 MG 24 hr tablet, TAKE ONE TABLET BY MOUTH ONCE EVERY MORNING, Disp: , Rfl:  .  montelukast (SINGULAIR) 10 MG tablet, Take 1 tablet (10 mg total) by mouth at bedtime., Disp: 30 tablet, Rfl: 0 .  omeprazole (PRILOSEC) 40 MG capsule, Take 40 mg by mouth daily., Disp: , Rfl: 2 .  OXYGEN, Inhale 2 L into the lungs at bedtime., Disp: , Rfl:  .  potassium chloride SA (K-DUR,KLOR-CON) 20 MEQ tablet, Take 1 tablet (20 mEq total) by mouth 2 (two) times daily., Disp: 180 tablet, Rfl: 0 .  pravastatin (PRAVACHOL) 40 MG tablet, Take 40 mg by mouth every evening. , Disp: , Rfl: 2 .  QC STOOL SOFTENER PLS LAXATIVE 8.6-50 MG tablet, Take 1 tablet by mouth at bedtime., Disp: , Rfl: 2 .  tiotropium (SPIRIVA) 18 MCG inhalation capsule, Place 18 mcg into inhaler and inhale daily., Disp: , Rfl:  .  clonazePAM (KLONOPIN) 2 MG tablet, Take 2 mg by mouth 3 (three) times daily. for anxiety, Disp: , Rfl: 1 .  EPINEPHrine 0.3 mg/0.3 mL IJ SOAJ injection, Inject 0.3 mg into the muscle daily as needed (allergic reaction)., Disp: , Rfl:  .   guaiFENesin (MUCINEX) 600 MG 12 hr tablet, Take 2 tablets (1,200 mg total) by mouth 2 (two) times daily., Disp: 60 tablet, Rfl: 0 .  nitroGLYCERIN (NITROSTAT) 0.4 MG SL tablet, Place 1 tablet (0.4 mg total) under the tongue every 5 (five) minutes as needed for chest pain. (Patient not taking: Reported on 06/05/2019), Disp: 12 tablet, Rfl: 0 .  nystatin cream (MYCOSTATIN), Apply 1 application topically 2 (two) times daily as needed for dry skin. , Disp: , Rfl: 1 Allergies  Allergen Reactions  . Bee Venom Swelling and Other (See Comments)    "All over my body" (swelling)  . Fioricet [Butalbital-Apap-Caffeine] Nausea And Vomiting and Rash  . Ibuprofen Rash and Other (See Comments)    Severe rash  . Lamisil [Terbinafine] Rash and Other (See Comments)    Pt states this causes  her to "feel funny"  . Nsaids Other (See Comments)    Per MD's orders      Social History   Socioeconomic History  . Marital status: Single    Spouse name: Not on file  . Number of children: Not on file  . Years of education: Not on file  . Highest education level: Not on file  Occupational History  . Occupation: unemployeed  Tobacco Use  . Smoking status: Former Smoker    Packs/day: 0.50    Years: 41.00    Pack years: 20.50    Types: Cigarettes    Quit date: 07/2016    Years since quitting: 2.9  . Smokeless tobacco: Never Used  . Tobacco comment: Starting to Wean Off -- using Nicotine Patch  Substance and Sexual Activity  . Alcohol use: Not Currently    Alcohol/week: 0.0 standard drinks    Comment: ? none now  . Drug use: Never    Comment: none for long time  . Sexual activity: Yes  Other Topics Concern  . Not on file  Social History Narrative  . Not on file   Social Determinants of Health   Financial Resource Strain:   . Difficulty of Paying Living Expenses: Not on file  Food Insecurity:   . Worried About Charity fundraiser in the Last Year: Not on file  . Ran Out of Food in the Last Year: Not  on file  Transportation Needs:   . Lack of Transportation (Medical): Not on file  . Lack of Transportation (Non-Medical): Not on file  Physical Activity:   . Days of Exercise per Week: Not on file  . Minutes of Exercise per Session: Not on file  Stress:   . Feeling of Stress : Not on file  Social Connections:   . Frequency of Communication with Friends and Family: Not on file  . Frequency of Social Gatherings with Friends and Family: Not on file  . Attends Religious Services: Not on file  . Active Member of Clubs or Organizations: Not on file  . Attends Archivist Meetings: Not on file  . Marital Status: Not on file  Intimate Partner Violence:   . Fear of Current or Ex-Partner: Not on file  . Emotionally Abused: Not on file  . Physically Abused: Not on file  . Sexually Abused: Not on file    Physical Exam Vitals reviewed.  Constitutional:      Appearance: She is obese.  HENT:     Head: Normocephalic.     Nose: Nose normal.     Mouth/Throat:     Mouth: Mucous membranes are moist.  Eyes:     Pupils: Pupils are equal, round, and reactive to light.  Cardiovascular:     Rate and Rhythm: Normal rate and regular rhythm.     Pulses: Normal pulses.     Heart sounds: Normal heart sounds.  Pulmonary:     Effort: Pulmonary effort is normal.     Breath sounds: Normal breath sounds.  Abdominal:     Palpations: Abdomen is soft.  Musculoskeletal:        General: Normal range of motion.     Cervical back: Normal range of motion.     Right lower leg: No edema.     Left lower leg: No edema.  Skin:    General: Skin is warm and dry.     Capillary Refill: Capillary refill takes less than 2 seconds.  Neurological:  Mental Status: She is alert. Mental status is at baseline.     Arrived for initial home visit for Ms. Perri who was seated in her chair alert and oriented with no complaints on assessment. Ms. Everitt reports feeling "pretty good". Ms. [place] noted to be using  2lpm of oxygen via nasal cannula. She denied shortness of breath. Lung sounds clear. Vitals as noted in report. Patient receives bubble packs and is compliant with same. Patient says these work well for her. She has not been seen by HF in some time. I will be getting an appointment scheduled for her asap. Ms. Renstrom also reports getting a letter from a medicaid investigator whom reports a possible misuse of her medicaid number, she states she is suspicious of this letter and feels unsafe contacting the senders of same letter. I reported same to [place] with permission of patient and she stated she would look into it. I will report findings to patient.Patient also requested an apointment for a colonoscopy I will be assisting with same. Ms. Vanoverbeke agree upon bi-weekly visits. I will continue to follow up. Home visit complete.    CBG- 111    No future appointments.   ACTION: Home visit completed Next visit planned for two weeks

## 2019-06-05 NOTE — Progress Notes (Signed)
She presents today chief complaint of painful area between the first and second metatarsals of the left foot.  States that is swollen states that she thinks it might be gout.  Objective: Vital signs are stable she is alert oriented x3 there is no erythema edema cellulitis drainage or odor anywhere with the exception of the first and metatarsal space left foot.  Moderately tender on palpation.  No bony pain.  Assessment neuritis first intermetatarsal space left.  Plan: Injected a small amount of dexamethasone to the area with 5 mg of Kenalog and 5 mg of Marcaine.  Tolerated procedure well without complications provided her with arch supports in her shoes and will follow up with her on an as-needed basis.

## 2019-06-09 ENCOUNTER — Telehealth (HOSPITAL_COMMUNITY): Payer: Self-pay

## 2019-06-09 NOTE — Telephone Encounter (Signed)
Spoke to Michele Owens to discuss her scheduled appointment on Monday the 15th at 93 and she understood and agreed with same.

## 2019-06-18 ENCOUNTER — Other Ambulatory Visit (HOSPITAL_COMMUNITY): Payer: Self-pay

## 2019-06-18 NOTE — Progress Notes (Signed)
Paramedicine Encounter    Patient ID: Michele Owens, female    DOB: 10/26/1960, 59 y.o.   MRN: [id number]   Patient Care Team: Patient, No Pcp Per as PCP - General (General Practice) Sharmon Revere as Referring Physician (Physician Assistant)  Patient Active Problem List   Diagnosis Date Noted  . Morbid obesity with BMI of 50.0-59.9, adult ([place])   . Fall at home, initial encounter 06/23/2018  . HNP (herniated nucleus pulposus), thoracic 06/23/2018  . CHF (congestive heart failure) ([place]) 06/18/2018  . Hypertensive disorder 08/28/2017  . Obesity 08/28/2017  . COPD exacerbation ([place]) 08/04/2017  . Acute and chronic respiratory failure with hypercapnia ([place])   . Pulmonary HTN ([place])   . Genetic testing 02/27/2017  . Family history of thyroid cancer   . Hypomagnesemia 10/03/2016  . Atypical chest pain   . HLD (hyperlipidemia) 09/23/2016  . Gout 09/23/2016  . DVT (deep vein thrombosis) in pregnancy 09/23/2016  . Thyroid cancer ([place]) 09/23/2016  . Hypocalcemia 09/23/2016  . AKI (acute kidney injury) ([place]) 09/23/2016  . Acute pulmonary edema ([facility])   . Acute hypoxemic respiratory failure ([place])   . Ventilator dependent ([place])   . Respiratory failure ([place]) 07/16/2016  . CAP (community acquired pneumonia) 01/23/2016  . 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 Pulmonary hypertension ([place]) 01/23/2016  . Obesity hypoventilation syndrome ([place]) 08/26/2015  . Dyslipidemia associated with type 2 diabetes mellitus ([place]) 04/24/2015  . Leukocytosis 04/24/2015  . Controlled type 2 diabetes mellitus without complication, without long-term current use of insulin ([place]) 04/24/2015  . Anxiety 04/24/2015  . Benign essential HTN 04/24/2015  . Normocytic anemia 04/24/2015  . OSA (obstructive sleep apnea) 05/10/2012  . Tobacco abuse 07/04/2011    Current Outpatient Medications:  .  albuterol (PROVENTIL HFA;VENTOLIN HFA) 108 (90 Base) MCG/ACT inhaler, Inhale 1-2 puffs  into the lungs every 6 (six) hours as needed for wheezing or shortness of breath., Disp: 1 Inhaler, Rfl: 0 .  allopurinol (ZYLOPRIM) 100 MG tablet, Take 100 mg by mouth daily., Disp: , Rfl:  .  ANORO ELLIPTA 62.5-25 MCG/INH AEPB, Inhale 1 puff into the lungs daily. , Disp: , Rfl:  .  Blood Glucose Monitoring Suppl (ACCU-CHEK AVIVA PLUS) w/Device KIT, USE AS DIRECTED TO CHECK BLOOD SUGAR, Disp: , Rfl: 0 .  butalbital-acetaminophen-caffeine (FIORICET, ESGIC) 50-325-40 MG tablet, Take 1-2 tablets by mouth every 4 (four) hours as needed for migraine., Disp: , Rfl:  .  calcitRIOL (ROCALTROL) 0.5 MCG capsule, Take 1 capsule (0.5 mcg total) by mouth daily., Disp: 30 capsule, Rfl: 0 .  carvedilol (COREG) 6.25 MG tablet, Take 1 tablet (6.25 mg total) by mouth 2 (two) times daily with a meal., Disp: 60 tablet, Rfl: 0 .  cetirizine (ZYRTEC) 10 MG tablet, Take 10 mg by mouth daily., Disp: , Rfl: 2 .  FEROSUL 325 (65 Fe) MG tablet, Take 325 mg by mouth daily., Disp: , Rfl: 3 .  furosemide (LASIX) 20 MG tablet, Take 2 tablets (40 mg total) by mouth 2 (two) times daily. Take 45m every morning and 433mevery evening (Patient taking differently: Take 40 mg by mouth 2 (two) times daily. Take 4042mvery morning and 89m61mery evening), Disp: 360 tablet, Rfl: 0 .  glucose blood test strip, Use as instructed to check blood sugar up to 4 times daily, Disp: 100 each, Rfl: 0 .  guaiFENesin (MUCINEX) 600 MG 12 hr tablet, Take 2 tablets (1,200 mg total) by mouth 2 (two) times  daily., Disp: 60 tablet, Rfl: 0 .  HYDROcodone-acetaminophen (NORCO/VICODIN) 5-325 MG tablet, Take 1 tablet by mouth every 6 (six) hours as needed for moderate pain. , Disp: , Rfl: 0 .  ipratropium-albuterol (DUONEB) 0.5-2.5 (3) MG/3ML SOLN, Take 3 mLs by nebulization 4 (four) times daily. , Disp: , Rfl: 3 .  levothyroxine (SYNTHROID, LEVOTHROID) 112 MCG tablet, Take 224 mcg by mouth daily., Disp: , Rfl: 5 .  lisinopril (PRINIVIL,ZESTRIL) 5 MG tablet,  Take 1 tablet (5 mg total) by mouth daily. Please call for office visit [phone], Disp: 30 tablet, Rfl: 0 .  loperamide (IMODIUM) 2 MG capsule, Take 1 capsule (2 mg total) by mouth 4 (four) times daily as needed for diarrhea or loose stools., Disp: 12 capsule, Rfl: 0 .  metFORMIN (GLUCOPHAGE) 500 MG tablet, Take 1 tablet (500 mg total) by mouth 2 (two) times daily with a meal., Disp: 30 tablet, Rfl: 1 .  methocarbamol (ROBAXIN) 500 MG tablet, Take 1 tablet (500 mg total) by mouth every 8 (eight) hours as needed for muscle spasms., Disp: 20 tablet, Rfl: 0 .  metoCLOPramide (REGLAN) 10 MG tablet, Take 1 tablet (10 mg total) by mouth every 6 (six) hours as needed for nausea (or headache)., Disp: 30 tablet, Rfl: 0 .  metoprolol succinate (TOPROL-XL) 25 MG 24 hr tablet, TAKE ONE TABLET BY MOUTH ONCE EVERY MORNING, Disp: , Rfl:  .  montelukast (SINGULAIR) 10 MG tablet, Take 1 tablet (10 mg total) by mouth at bedtime., Disp: 30 tablet, Rfl: 0 .  omeprazole (PRILOSEC) 40 MG capsule, Take 40 mg by mouth daily., Disp: , Rfl: 2 .  OXYGEN, Inhale 2 L into the lungs at bedtime., Disp: , Rfl:  .  potassium chloride SA (K-DUR,KLOR-CON) 20 MEQ tablet, Take 1 tablet (20 mEq total) by mouth 2 (two) times daily., Disp: 180 tablet, Rfl: 0 .  pravastatin (PRAVACHOL) 40 MG tablet, Take 40 mg by mouth every evening. , Disp: , Rfl: 2 .  tiotropium (SPIRIVA) 18 MCG inhalation capsule, Place 18 mcg into inhaler and inhale daily., Disp: , Rfl:  .  clonazePAM (KLONOPIN) 2 MG tablet, Take 2 mg by mouth 3 (three) times daily. for anxiety, Disp: , Rfl: 1 .  diclofenac sodium (VOLTAREN) 1 % GEL, Apply 2 g topically 4 (four) times daily as needed (for pain). (Patient not taking: Reported on 06/18/2019), Disp: 1 Tube, Rfl: 0 .  EPINEPHrine 0.3 mg/0.3 mL IJ SOAJ injection, Inject 0.3 mg into the muscle daily as needed (allergic reaction)., Disp: , Rfl:  .  lidocaine (LIDODERM) 5 %, Place 1 patch onto the skin daily. Remove & Discard  patch within 12 hours or as directed by MD, Disp: 5 patch, Rfl: 0 .  nitroGLYCERIN (NITROSTAT) 0.4 MG SL tablet, Place 1 tablet (0.4 mg total) under the tongue every 5 (five) minutes as needed for chest pain. (Patient not taking: Reported on 06/05/2019), Disp: 12 tablet, Rfl: 0 .  nystatin cream (MYCOSTATIN), Apply 1 application topically 2 (two) times daily as needed for dry skin. , Disp: , Rfl: 1 .  QC STOOL SOFTENER PLS LAXATIVE 8.6-50 MG tablet, Take 1 tablet by mouth at bedtime., Disp: , Rfl: 2 Allergies  Allergen Reactions  . Bee Venom Swelling and Other (See Comments)    "All over my body" (swelling)  . Fioricet [Butalbital-Apap-Caffeine] Nausea And Vomiting and Rash  . Ibuprofen Rash and Other (See Comments)    Severe rash  . Lamisil [Terbinafine] Rash and Other (See Comments)  Pt states this causes her to "feel funny"  . Nsaids Other (See Comments)    Per MD's orders      Social History   Socioeconomic History  . Marital status: Single    Spouse name: Not on file  . Number of children: Not on file  . Years of education: Not on file  . Highest education level: Not on file  Occupational History  . Occupation: unemployeed  Tobacco Use  . Smoking status: Former Smoker    Packs/day: 0.50    Years: 41.00    Pack years: 20.50    Types: Cigarettes    Quit date: 07/2016    Years since quitting: 2.9  . Smokeless tobacco: Never Used  . Tobacco comment: Starting to Wean Off -- using Nicotine Patch  Substance and Sexual Activity  . Alcohol use: Not Currently    Alcohol/week: 0.0 standard drinks    Comment: ? none now  . Drug use: Never    Comment: none for long time  . Sexual activity: Yes  Other Topics Concern  . Not on file  Social History Narrative  . Not on file   Social Determinants of Health   Financial Resource Strain:   . Difficulty of Paying Living Expenses: Not on file  Food Insecurity:   . Worried About Charity fundraiser in the Last Year: Not on file   . Ran Out of Food in the Last Year: Not on file  Transportation Needs:   . Lack of Transportation (Medical): Not on file  . Lack of Transportation (Non-Medical): Not on file  Physical Activity:   . Days of Exercise per Week: Not on file  . Minutes of Exercise per Session: Not on file  Stress:   . Feeling of Stress : Not on file  Social Connections:   . Frequency of Communication with Friends and Family: Not on file  . Frequency of Social Gatherings with Friends and Family: Not on file  . Attends Religious Services: Not on file  . Active Member of Clubs or Organizations: Not on file  . Attends Archivist Meetings: Not on file  . Marital Status: Not on file  Intimate Partner Violence:   . Fear of Current or Ex-Partner: Not on file  . Emotionally Abused: Not on file  . Physically Abused: Not on file  . Sexually Abused: Not on file    Physical Exam   Arrived for home visit for [facility] who was alert and oriented meeting me at the door without difficulty breathing. Michele Owens had no complaints today. Patient weighed 290lbs. Vitals were obtained. Medicines were reviewed and confirmed as noted in her bubble packs. Patient has been taking same as prescribed. Michele Owens was reminded of her appointment at the HF clinic on the 15th at 42. I advised her I would be there. She agreed. Michele Owens agreed to continuing bi-weekly visits. Home visit complete.    Future Appointments  Date Time Provider [place]  06/23/2019 12:00 PM [facility] PA/NP [facility] None     ACTION: Home visit completed Next visit planned for two weeks

## 2019-06-23 ENCOUNTER — Emergency Department (HOSPITAL_COMMUNITY)
Admission: EM | Admit: 2019-06-23 | Discharge: 2019-06-23 | Disposition: A | Discharge status: Home / Self Care | Payer: Medicaid Other | Attending: Emergency Medicine | Admitting: Emergency Medicine

## 2019-06-23 ENCOUNTER — Encounter (HOSPITAL_COMMUNITY): Payer: Self-pay | Admitting: Pediatrics

## 2019-06-23 ENCOUNTER — Emergency Department (HOSPITAL_COMMUNITY): Payer: Medicaid Other

## 2019-06-23 ENCOUNTER — Ambulatory Visit (HOSPITAL_COMMUNITY)
Admission: RE | Admit: 2019-06-23 | Discharge: 2019-06-23 | Disposition: A | Discharge status: Home / Self Care | Payer: Medicaid Other | Source: Ambulatory Visit | Attending: Cardiology | Admitting: Cardiology

## 2019-06-23 ENCOUNTER — Encounter (HOSPITAL_COMMUNITY): Payer: Self-pay

## 2019-06-23 ENCOUNTER — Other Ambulatory Visit: Payer: Self-pay

## 2019-06-23 DIAGNOSIS — R0602 Shortness of breath: Secondary | ICD-10-CM | POA: Insufficient documentation

## 2019-06-23 DIAGNOSIS — Z79899 Other long term (current) drug therapy: Secondary | ICD-10-CM | POA: Diagnosis not present

## 2019-06-23 DIAGNOSIS — J449 Chronic obstructive pulmonary disease, unspecified: Secondary | ICD-10-CM | POA: Insufficient documentation

## 2019-06-23 DIAGNOSIS — I110 Hypertensive heart disease with heart failure: Secondary | ICD-10-CM | POA: Diagnosis not present

## 2019-06-23 DIAGNOSIS — I5032 Chronic diastolic (congestive) heart failure: Secondary | ICD-10-CM | POA: Diagnosis not present

## 2019-06-23 DIAGNOSIS — Z7984 Long term (current) use of oral hypoglycemic drugs: Secondary | ICD-10-CM | POA: Insufficient documentation

## 2019-06-23 DIAGNOSIS — E119 Type 2 diabetes mellitus without complications: Secondary | ICD-10-CM | POA: Diagnosis not present

## 2019-06-23 DIAGNOSIS — R0789 Other chest pain: Secondary | ICD-10-CM

## 2019-06-23 LAB — TROPONIN I (HIGH SENSITIVITY)
Troponin I (High Sensitivity): 4 ng/L (ref <18)
Troponin I (High Sensitivity): 4 ng/L (ref <18)

## 2019-06-23 LAB — CBC WITH DIFFERENTIAL/PLATELET
Abs Immature Granulocytes: 0.08 10*3/uL — ABNORMAL HIGH (ref 0.00–0.07)
Basophils Absolute: 0.1 10*3/uL (ref 0.0–0.1)
Basophils Relative: 0 %
Eosinophils Absolute: 0.2 10*3/uL (ref 0.0–0.5)
Eosinophils Relative: 1 %
HCT: 40.6 % (ref 36.0–46.0)
Hemoglobin: 12.4 g/dL (ref 12.0–15.0)
Immature Granulocytes: 1 %
Lymphocytes Relative: 15 %
Lymphs Abs: 2.4 10*3/uL (ref 0.7–4.0)
MCH: 28.7 pg (ref 26.0–34.0)
MCHC: 30.5 g/dL (ref 30.0–36.0)
MCV: 94 fL (ref 80.0–100.0)
Monocytes Absolute: 0.9 10*3/uL (ref 0.1–1.0)
Monocytes Relative: 6 %
Neutro Abs: 12.3 10*3/uL — ABNORMAL HIGH (ref 1.7–7.7)
Neutrophils Relative %: 77 %
Platelets: 306 10*3/uL (ref 150–400)
RBC: 4.32 MIL/uL (ref 3.87–5.11)
RDW: 13.1 % (ref 11.5–15.5)
WBC: 15.9 10*3/uL — ABNORMAL HIGH (ref 4.0–10.5)
nRBC: 0 % (ref 0.0–0.2)

## 2019-06-23 LAB — URINALYSIS, ROUTINE W REFLEX MICROSCOPIC
Bilirubin Urine: NEGATIVE
Glucose, UA: NEGATIVE mg/dL
Hgb urine dipstick: NEGATIVE
Ketones, ur: NEGATIVE mg/dL
Leukocytes,Ua: NEGATIVE
Nitrite: NEGATIVE
Protein, ur: NEGATIVE mg/dL
Specific Gravity, Urine: 1.019 (ref 1.005–1.030)
pH: 5 (ref 5.0–8.0)

## 2019-06-23 LAB — BASIC METABOLIC PANEL
Anion gap: 13 (ref 5–15)
BUN: 12 mg/dL (ref 6–20)
CO2: 29 mmol/L (ref 22–32)
Calcium: 8.9 mg/dL (ref 8.9–10.3)
Chloride: 96 mmol/L — ABNORMAL LOW (ref 98–111)
Creatinine, Ser: 0.71 mg/dL (ref 0.44–1.00)
GFR calc Af Amer: >60 mL/min (ref >60)
GFR calc non Af Amer: >60 mL/min (ref >60)
Glucose, Bld: 119 mg/dL — ABNORMAL HIGH (ref 70–99)
Potassium: 3.9 mmol/L (ref 3.5–5.1)
Sodium: 138 mmol/L (ref 135–145)

## 2019-06-23 LAB — CBG MONITORING, ED: Glucose-Capillary: 98 mg/dL (ref 70–99)

## 2019-06-23 LAB — BRAIN NATRIURETIC PEPTIDE: B Natriuretic Peptide: 39.3 pg/mL (ref 0.0–100.0)

## 2019-06-23 MED ORDER — CLONAZEPAM 2 MG PO TABS: 2.0000 mg | ORAL_TABLET | Freq: Three times a day (TID) | ORAL | 0 refills | Status: DC

## 2019-06-23 MED ORDER — NITROGLYCERIN 0.4 MG SL SUBL: 0.4000 mg | SUBLINGUAL_TABLET | SUBLINGUAL | Status: DC | PRN

## 2019-06-23 MED ORDER — MORPHINE SULFATE (PF) 2 MG/ML IV SOLN
2.0000 mg | Freq: Once | INTRAVENOUS | Status: AC
  Administered 2019-06-23: 15:00:00 2 mg via INTRAVENOUS
  Filled 2019-06-23: qty 1

## 2019-06-23 MED ORDER — CLONAZEPAM 0.5 MG PO TABS
2.0000 mg | ORAL_TABLET | Freq: Once | ORAL | Status: AC
  Administered 2019-06-23: 2 mg via ORAL
  Filled 2019-06-23: qty 4

## 2019-06-23 MED ORDER — ALUM & MAG HYDROXIDE-SIMETH 200-200-20 MG/5ML PO SUSP
30.0000 mL | Freq: Once | ORAL | Status: AC
  Administered 2019-06-23: 30 mL via ORAL
  Filled 2019-06-23: qty 30

## 2019-06-23 NOTE — ED Provider Notes (Signed)
Pt signed out by Dr. Tyrone Nine pending delta trop.  Pt's troponin is negative.  Pt is very stressed out and is out of her Klonopin.  She has an appt tomorrow with her pcp.  I gave her a rx for 10 pills to last until she can get a refill.  Pt given a copy of her labs.  She asked me to call her daughter which I tried to do, but she did not pick up and voicemail was full.  Pt is stable for d/c.  Return if worse.  F/u with pcp tomorrow as scheduled.   [name], MD 06/23/19 [phone]

## 2019-06-23 NOTE — ED Notes (Signed)
Pt stated that she was having chest pain. EKG exported and given to Dr. Tyrone Nine.

## 2019-06-23 NOTE — ED Triage Notes (Signed)
Patient arrived accompanied by staff from heart clinic. Patient was kept stating "what's going on with me" "my chest hurts"

## 2019-06-23 NOTE — ED Provider Notes (Signed)
[facility] EMERGENCY DEPARTMENT Provider Note   CSN: [id number] Arrival date & time: 06/23/19  1217     History Chief Complaint  Patient presents with  . Chest Pain  . Shortness of Breath    Michele Owens is a 59 y.o. female.  59 yo F with a chief complaints of shortness of breath on exertion.  The patient was going to her cardiologist office today for annual evaluation and as she was walking from the transportation bus to the office she started to have right-sided chest pressure that seems to come and go.  Associated with some shortness of breath.  Denies radiation of the pain denies diaphoresis nausea or vomiting.  Denies history of MI.  Has a history of hypertension hyperlipidemia diabetes no smoking history.  The history is provided by the patient.  Chest Pain Pain location:  R chest Pain quality: pressure   Pain radiates to:  Does not radiate Pain severity:  Moderate Onset quality:  Gradual Duration:  20 minutes Timing:  Intermittent Progression:  Waxing and waning Chronicity:  New Relieved by:  Nothing Worsened by:  Nothing Ineffective treatments:  None tried Associated symptoms: shortness of breath   Associated symptoms: no dizziness, no fever, no headache, no nausea, no palpitations and no vomiting        Past Medical History:  Diagnosis Date  . Anxiety   . Arthritis   . Asthma   . Chronic diastolic CHF (congestive heart failure) ([place])   . COPD (chronic obstructive pulmonary disease) (HCC)    Uses Oxygen at night  . Depression   . Diabetes mellitus without complication ([place])   . Dyspnea    occ  . Family history of thyroid cancer   . GERD (gastroesophageal reflux disease)   . Gout   . Headache    migraines  . History of DVT of lower extremity   . History of nuclear stress test    Myoview 2/17:  Low risk stress nuclear study with a small, moderate intensity, partially reversible inferior lateral defect consistent with small prior  infarct and minimal peri-infarct ischemia; EF 68 with normal wall motion  . Hypertension   . Pneumonia   . Thyroid cancer ([place]) 09/23/2016    Patient Active Problem List   Diagnosis Date Noted  . Morbid obesity with BMI of 50.0-59.9, adult ([place])   . Fall at home, initial encounter 06/23/2018  . HNP (herniated nucleus pulposus), thoracic 06/23/2018  . CHF (congestive heart failure) ([place]) 06/18/2018  . Hypertensive disorder 08/28/2017  . Obesity 08/28/2017  . COPD exacerbation ([place]) 08/04/2017  . Acute and chronic respiratory failure with hypercapnia ([place])   . Pulmonary HTN ([place])   . Genetic testing 02/27/2017  . Family history of thyroid cancer   . Hypomagnesemia 10/03/2016  . Atypical chest pain   . HLD (hyperlipidemia) 09/23/2016  . Gout 09/23/2016  . DVT (deep vein thrombosis) in pregnancy 09/23/2016  . Thyroid cancer ([place]) 09/23/2016  . Hypocalcemia 09/23/2016  . AKI (acute kidney injury) ([place]) 09/23/2016  . Acute pulmonary edema (HCC)   . Acute hypoxemic respiratory failure ([place])   . Ventilator dependent ([place])   . Respiratory failure ([place]) 07/16/2016  . CAP (community acquired pneumonia) 01/23/2016  . 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 Pulmonary hypertension ([place]) 01/23/2016  . Obesity hypoventilation syndrome ([place]) 08/26/2015  . Dyslipidemia associated with type 2 diabetes mellitus ([place]) 04/24/2015  . Leukocytosis 04/24/2015  . Controlled type 2 diabetes mellitus  without complication, without long-term current use of insulin ([place]) 04/24/2015  . Anxiety 04/24/2015  . Benign essential HTN 04/24/2015  . Normocytic anemia 04/24/2015  . OSA (obstructive sleep apnea) 05/10/2012  . Tobacco abuse 07/04/2011    Past Surgical History:  Procedure Laterality Date  . BUNIONECTOMY Bilateral   . CARDIAC CATHETERIZATION N/A 06/23/2015   Procedure: Right/Left Heart Cath and Coronary Angiography;  Surgeon: [name], MD;  Location: [facility];  Service: Cardiovascular;  Laterality: N/A;  . COLONOSCOPY WITH PROPOFOL N/A 12/15/2015   Procedure: COLONOSCOPY WITH PROPOFOL;  Surgeon: [name], MD;  Location: [facility];  Service: Endoscopy;  Laterality: N/A;  . THYROIDECTOMY  09/19/2016  . THYROIDECTOMY N/A 09/19/2016   Procedure: TOTAL THYROIDECTOMY;  Surgeon: [name], MD;  Location: [facility];  Service: General;  Laterality: N/A;  . TONSILLECTOMY    . TOTAL ABDOMINAL HYSTERECTOMY  07/14/10     OB History   No obstetric history on file.     Family History  Problem Relation Age of Onset  . Cancer Father        thought to be due to exposure to concrete  . Diabetes Mother   . Thyroid cancer Mother        dx in her 33s-60s  . Cancer Maternal Uncle        2 uncles with cancer NOS  . Brain cancer Paternal Aunt   . Cancer Cousin        maternal first cousin - NOS  . Cancer Cousin        maternal first cousin - NOS    Social History   Tobacco Use  . Smoking status: Former Smoker    Packs/day: 0.50    Years: 41.00    Pack years: 20.50    Types: Cigarettes    Quit date: 07/2016    Years since quitting: 2.9  . Smokeless tobacco: Never Used  . Tobacco comment: Starting to Wean Off -- using Nicotine Patch  Substance Use Topics  . Alcohol use: Not Currently    Alcohol/week: 0.0 standard drinks    Comment: ? none now  . Drug use: Never    Comment: none for long time    Home Medications Prior to Admission medications   Medication Sig Start Date End Date Taking? Authorizing Provider  albuterol (PROVENTIL HFA;VENTOLIN HFA) 108 (90 Base) MCG/ACT inhaler Inhale 1-2 puffs into the lungs every 6 (six) hours as needed for wheezing or shortness of breath. 04/23/18   Long, [name], MD  allopurinol (ZYLOPRIM) 100 MG tablet Take 100 mg by mouth daily.    [provider]  ANORO ELLIPTA 62.5-25 MCG/INH AEPB Inhale 1 puff into the lungs daily.  04/11/18   [provider]  Blood Glucose Monitoring Suppl  (ACCU-CHEK AVIVA PLUS) w/Device KIT 1 each by Other route daily.  08/07/17   [provider]  butalbital-acetaminophen-caffeine (FIORICET, ESGIC) 50-325-40 MG tablet Take 1-2 tablets by mouth every 4 (four) hours as needed for migraine. 06/13/18   [provider]  calcitRIOL (ROCALTROL) 0.5 MCG capsule Take 1 capsule (0.5 mcg total) by mouth daily. 09/26/16   Hongalgi, [name], MD  carvedilol (COREG) 6.25 MG tablet Take 1 tablet (6.25 mg total) by mouth 2 (two) times daily with a meal. 01/23/18   Bensimhon, [name], MD  cetirizine (ZYRTEC) 10 MG tablet Take 10 mg by mouth daily. 07/10/17   [provider]  clonazePAM (KLONOPIN) 2 MG tablet Take 1  tablet (2 mg total) by mouth 3 (three) times daily. for anxiety 06/23/19   [name], MD  diclofenac sodium (VOLTAREN) 1 % GEL Apply 2 g topically 4 (four) times daily as needed (for pain). Patient not taking: Reported on 06/18/2019 06/19/18   [name], MD  EPINEPHrine 0.3 mg/0.3 mL IJ SOAJ injection Inject 0.3 mg into the muscle daily as needed (allergic reaction).    [provider]  FEROSUL 325 (65 Fe) MG tablet Take 325 mg by mouth daily. 03/22/17   [provider]  furosemide (LASIX) 20 MG tablet Take 2 tablets (40 mg total) by mouth 2 (two) times daily. Take 57m every morning and 476mevery evening Patient taking differently: Take 40 mg by mouth 2 (two) times daily. Take 4073mvery morning and 49m35mery evening 07/24/18   McLeLarey Dresser  glucose blood test strip Use as instructed to check blood sugar up to 4 times daily 08/06/17   ChoiDessa Phi  guaiFENesin (MUCINEX) 600 MG 12 hr tablet Take 2 tablets (1,200 mg total) by mouth 2 (two) times daily. 07/11/18   Xu, Florencia Reasons  HYDROcodone-acetaminophen (NORCO/VICODIN) 5-325 MG tablet Take 1 tablet by mouth every 6 (six) hours as needed for moderate pain.  02/12/18   [provider]  ipratropium-albuterol (DUONEB) 0.5-2.5 (3) MG/3ML SOLN Take  3 mLs by nebulization 4 (four) times daily.  04/05/17   [provider]  levothyroxine (SYNTHROID, LEVOTHROID) 112 MCG tablet Take 224 mcg by mouth daily. 07/11/17   [provider]  lidocaine (LIDODERM) 5 % Place 1 patch onto the skin daily. Remove & Discard patch within 12 hours or as directed by MD 07/11/18   Xu, Florencia Reasons  lisinopril (PRINIVIL,ZESTRIL) 5 MG tablet Take 1 tablet (5 mg total) by mouth daily. Please call for office visit [phone] 08/15/17   McLeLarey Dresser  loperamide (IMODIUM) 2 MG capsule Take 1 capsule (2 mg total) by mouth 4 (four) times daily as needed for diarrhea or loose stools. 7/160/62/69licDelora Fuel  metFORMIN (GLUCOPHAGE) 500 MG tablet Take 1 tablet (500 mg total) by mouth 2 (two) times daily with a meal. 12/11/14   SullDelfina Redwood  methocarbamol (ROBAXIN) 500 MG tablet Take 1 tablet (500 mg total) by mouth every 8 (eight) hours as needed for muscle spasms. 06/19/18   SmitNorval Morton  metoCLOPramide (REGLAN) 10 MG tablet Take 1 tablet (10 mg total) by mouth every 6 (six) hours as needed for nausea (or headache). 7/164/85/46licDelora Fuel  metoprolol succinate (TOPROL-XL) 25 MG 24 hr tablet Take 25 mg by mouth in the morning.  08/09/18   [provider]  montelukast (SINGULAIR) 10 MG tablet Take 1 tablet (10 mg total) by mouth at bedtime. 07/11/18   Xu, Florencia Reasons  nitroGLYCERIN (NITROSTAT) 0.4 MG SL tablet Place 1 tablet (0.4 mg total) under the tongue every 5 (five) minutes as needed for chest pain. Patient not taking: Reported on 06/05/2019 06/19/18   SmitNorval Morton  nystatin cream (MYCOSTATIN) Apply 1 application topically 2 (two) times daily as needed for dry skin.  04/13/17   [provider]  omeprazole (PRILOSEC) 40 MG capsule Take 40 mg by mouth daily. 04/13/15   [provider]  OXYGEN Inhale 2 L into the lungs at bedtime.    [provider]  potassium chloride SA (K-DUR,KLOR-CON) 20 MEQ tablet Take 1  tablet (20 mEq total)  by mouth 2 (two) times daily. 07/24/18   [name], MD  pravastatin (PRAVACHOL) 40 MG tablet Take 40 mg by mouth every evening.  03/29/15   [provider]  QC STOOL SOFTENER PLS LAXATIVE 8.6-50 MG tablet Take 1 tablet by mouth at bedtime. 04/11/17   [provider]  tiotropium (SPIRIVA) 18 MCG inhalation capsule Place 18 mcg into inhaler and inhale daily.    [provider]    Allergies    Bee venom, Fioricet [butalbital-apap-caffeine], Ibuprofen, Lamisil [terbinafine], and Nsaids  Review of Systems   Review of Systems  Constitutional: Negative for chills and fever.  HENT: Negative for congestion and rhinorrhea.   Eyes: Negative for redness and visual disturbance.  Respiratory: Positive for shortness of breath. Negative for wheezing.   Cardiovascular: Positive for chest pain. Negative for palpitations.  Gastrointestinal: Negative for nausea and vomiting.  Genitourinary: Negative for dysuria and urgency.  Musculoskeletal: Negative for arthralgias and myalgias.  Skin: Negative for pallor and wound.  Neurological: Negative for dizziness and headaches.    Physical Exam Updated Vital Signs BP (!) 112/58   Pulse 73   Temp 98.5 F (36.9 C) (Oral)   Resp (!) 21   Ht 5' 5" (1.651 m)   Wt 131.5 kg   SpO2 98%   BMI 48.26 kg/m   Physical Exam Vitals and nursing note reviewed.  Constitutional:      General: She is not in acute distress.    Appearance: She is well-developed. She is morbidly obese. She is not diaphoretic.  HENT:     Head: Normocephalic and atraumatic.  Eyes:     Pupils: Pupils are equal, round, and reactive to light.  Cardiovascular:     Rate and Rhythm: Normal rate and regular rhythm.     Heart sounds: No murmur. No friction rub. No gallop.   Pulmonary:     Effort: Pulmonary effort is normal. Tachypnea present.     Breath sounds: No wheezing or rales.  Abdominal:     General: There is no distension.      Palpations: Abdomen is soft.     Tenderness: There is no abdominal tenderness.  Musculoskeletal:        General: No tenderness.     Cervical back: Normal range of motion and neck supple.  Skin:    General: Skin is warm and dry.  Neurological:     Mental Status: She is alert and oriented to person, place, and time.  Psychiatric:        Behavior: Behavior normal.     ED Results / Procedures / Treatments   Labs (all labs ordered are listed, but only abnormal results are displayed) Labs Reviewed  CBC WITH DIFFERENTIAL/PLATELET - Abnormal; Notable for the following components:      Result Value   WBC 15.9 (*)    Neutro Abs 12.3 (*)    Abs Immature Granulocytes 0.08 (*)    All other components within normal limits  BASIC METABOLIC PANEL - Abnormal; Notable for the following components:   Chloride 96 (*)    Glucose, Bld 119 (*)    All other components within normal limits  BRAIN NATRIURETIC PEPTIDE  URINALYSIS, ROUTINE W REFLEX MICROSCOPIC  CBG MONITORING, ED  TROPONIN I (HIGH SENSITIVITY)  TROPONIN I (HIGH SENSITIVITY)    EKG EKG Interpretation  Date/Time:  Monday June 23 2019 12:35:41 EDT Ventricular Rate:  86 PR Interval:    QRS Duration: 78 QT Interval:  359 QTC Calculation:  430 R Axis:   41 Text Interpretation: Sinus rhythm No significant change since last tracing Confirmed by Deno Etienne [phone]) on 06/23/2019 12:49:28 PM   Radiology DG Chest Port 1 View  Result Date: 06/23/2019 CLINICAL DATA:  Shortness of breath. Chest pain. EXAM: PORTABLE CHEST 1 VIEW COMPARISON:  Radiograph 01/01/2019. CT 06/09/2018 FINDINGS: Low lung volumes. Unchanged cardiomegaly. Unchanged mediastinal contours with bilateral hilar prominence related to prominent pulmonary arteries. Streaky bibasilar opacities likely atelectasis or scarring. No confluent airspace disease. No pulmonary edema. No pleural effusion or pneumothorax. No acute osseous abnormalities are seen. Surgical clips at the  thoracic inlet. IMPRESSION: 1. Low lung volumes with bibasilar atelectasis or scarring. 2. Stable cardiomegaly. Electronically Signed   By: Keith Rake M.D.   On: 06/23/2019 13:22    Procedures Procedures (including critical care time)  Medications Ordered in ED Medications  nitroGLYCERIN (NITROSTAT) SL tablet 0.4 mg (has no administration in time range)  alum & mag hydroxide-simeth (MAALOX/MYLANTA) 200-200-20 MG/5ML suspension 30 mL (30 mLs Oral Given 06/23/19 1516)  morphine 2 MG/ML injection 2 mg (2 mg Intravenous Given 06/23/19 1516)    ED Course  I have reviewed the triage vital signs and the nursing notes.  Pertinent labs & imaging results that were available during my care of the patient were reviewed by me and considered in my medical decision making (see chart for details).    MDM Rules/Calculators/A&P                      59 yo F with a chief complaint of shortness of breath on exertion this started today when she tried to go to her cardiologist office appointment.  Brought rapidly to the ED.  Clear lung sounds for me though is tachypneic.  Complaining of chest pain that seems to come and go.  Will obtain a laboratory evaluation chest x-ray reassess.  CXR without focal infiltrate.  Initial trop negative, awaiting delta.   Signed out to Dr. Gilford Raid, please see her note for further details of care in the ED.   The patients results and plan were reviewed and discussed.   Any x-rays performed were independently reviewed by myself.   Differential diagnosis were considered with the presenting HPI.  Medications  nitroGLYCERIN (NITROSTAT) SL tablet 0.4 mg (has no administration in time range)  alum & mag hydroxide-simeth (MAALOX/MYLANTA) 200-200-20 MG/5ML suspension 30 mL (30 mLs Oral Given 06/23/19 1516)  morphine 2 MG/ML injection 2 mg (2 mg Intravenous Given 06/23/19 1516)    Vitals:   06/23/19 1245 06/23/19 1300 06/23/19 1508 06/23/19 1530  BP: 134/71 126/67 (!)  131/99 (!) 112/58  Pulse: 72 70 77 73  Resp: (!) 22 19 (!) 22 (!) 21  Temp:      TempSrc:      SpO2: 99% 97% 98% 98%  Weight:      Height:        Final diagnoses:  Atypical chest pain     Final Clinical Impression(s) / ED Diagnoses Final diagnoses:  Atypical chest pain    Rx / DC Orders ED Discharge Orders         Ordered    clonazePAM (KLONOPIN) 2 MG tablet  3 times daily     06/23/19 [place], Jabes Primo, DO 06/23/19 1728

## 2019-06-24 ENCOUNTER — Telehealth (HOSPITAL_COMMUNITY): Payer: Self-pay | Admitting: Licensed Clinical Social Worker

## 2019-06-24 NOTE — Telephone Encounter (Signed)
CSW called pt to see if they have received or been scheduled to receive the COVID-19 vaccine at this time.  Pt has not received the vaccine yet but is interested in getting it.  Currently at her PCP appt and would like to discuss with her MD before committing to getting one.  Pt requested CSW contact info and will call me back if she decides to sign up for a shot.  Jorge Ny, LCSW Clinical Social Worker [facility] Desk#: [phone] Cell#: [phone]

## 2019-06-25 ENCOUNTER — Encounter (HOSPITAL_COMMUNITY): Payer: Self-pay

## 2019-06-25 ENCOUNTER — Emergency Department (HOSPITAL_COMMUNITY)
Admission: EM | Admit: 2019-06-25 | Discharge: 2019-06-26 | Disposition: A | Discharge status: Home / Self Care | Payer: Medicaid Other | Attending: Emergency Medicine | Admitting: Emergency Medicine

## 2019-06-25 ENCOUNTER — Emergency Department (HOSPITAL_COMMUNITY): Payer: Medicaid Other

## 2019-06-25 ENCOUNTER — Other Ambulatory Visit: Payer: Self-pay

## 2019-06-25 DIAGNOSIS — R0602 Shortness of breath: Secondary | ICD-10-CM

## 2019-06-25 DIAGNOSIS — Z79899 Other long term (current) drug therapy: Secondary | ICD-10-CM | POA: Insufficient documentation

## 2019-06-25 DIAGNOSIS — J449 Chronic obstructive pulmonary disease, unspecified: Secondary | ICD-10-CM | POA: Diagnosis not present

## 2019-06-25 DIAGNOSIS — Z87891 Personal history of nicotine dependence: Secondary | ICD-10-CM | POA: Insufficient documentation

## 2019-06-25 DIAGNOSIS — I5032 Chronic diastolic (congestive) heart failure: Secondary | ICD-10-CM | POA: Diagnosis not present

## 2019-06-25 DIAGNOSIS — I110 Hypertensive heart disease with heart failure: Secondary | ICD-10-CM | POA: Diagnosis not present

## 2019-06-25 DIAGNOSIS — R0789 Other chest pain: Secondary | ICD-10-CM

## 2019-06-25 LAB — CBC WITH DIFFERENTIAL/PLATELET
Abs Immature Granulocytes: 0.09 10*3/uL — ABNORMAL HIGH (ref 0.00–0.07)
Basophils Absolute: 0.1 10*3/uL (ref 0.0–0.1)
Basophils Relative: 0 %
Eosinophils Absolute: 0.2 10*3/uL (ref 0.0–0.5)
Eosinophils Relative: 1 %
HCT: 37.2 % (ref 36.0–46.0)
Hemoglobin: 11.4 g/dL — ABNORMAL LOW (ref 12.0–15.0)
Immature Granulocytes: 1 %
Lymphocytes Relative: 18 %
Lymphs Abs: 2.6 10*3/uL (ref 0.7–4.0)
MCH: 28.9 pg (ref 26.0–34.0)
MCHC: 30.6 g/dL (ref 30.0–36.0)
MCV: 94.2 fL (ref 80.0–100.0)
Monocytes Absolute: 0.8 10*3/uL (ref 0.1–1.0)
Monocytes Relative: 6 %
Neutro Abs: 11.1 10*3/uL — ABNORMAL HIGH (ref 1.7–7.7)
Neutrophils Relative %: 74 %
Platelets: 289 10*3/uL (ref 150–400)
RBC: 3.95 MIL/uL (ref 3.87–5.11)
RDW: 13.2 % (ref 11.5–15.5)
WBC: 14.9 10*3/uL — ABNORMAL HIGH (ref 4.0–10.5)
nRBC: 0 % (ref 0.0–0.2)

## 2019-06-25 LAB — D-DIMER, QUANTITATIVE: D-Dimer, Quant: 0.37 ug/mL-FEU (ref 0.00–0.50)

## 2019-06-25 MED ORDER — FENTANYL CITRATE (PF) 100 MCG/2ML IJ SOLN
50.0000 ug | Freq: Once | INTRAMUSCULAR | Status: AC
  Administered 2019-06-25: 50 ug via INTRAVENOUS
  Filled 2019-06-25: qty 2

## 2019-06-25 MED ORDER — IPRATROPIUM-ALBUTEROL 0.5-2.5 (3) MG/3ML IN SOLN
3.0000 mL | RESPIRATORY_TRACT | Status: DC
  Administered 2019-06-25: 3 mL via RESPIRATORY_TRACT
  Filled 2019-06-25: qty 3

## 2019-06-25 NOTE — ED Triage Notes (Signed)
Pt BIB [facility] for multiple complaints, most notably SHOB, dizziness, & CP radiating . Pt also endorses some bleeding on abdomen where she reports "boils."  Pt has hx of CHF, COPD, DM EMS got 20g in L forearm, no meds given en route

## 2019-06-25 NOTE — ED Provider Notes (Signed)
Emergency Department Provider Note   I have reviewed the triage vital signs and the nursing notes.   HISTORY  Chief Complaint Chest Pain, Shortness of Breath, and Dizziness   HPI Michele Owens is a 59 y.o. female who presents the emerge department today with multiple symptoms.  Seems that the predominant factor for showing up is persistent chest pain shortness of breath.  Patient states this is similar to what she had a couple days ago when she had work-up done in emergency room that ended up negative.  She also is is very adamant that she is stressed out.  She thinks that this may be hurting her heart.  She does state that she has chest pain that moves from right to left and describes it as sometimes sharp in nature.  She has a nonproductive cough.  She states she has some dyspnea on exertion.  No fevers.  No trauma.  No sick contacts.  Has history of COPD and not on breathing treatments because there was some mechanical difficulty with her nebulizer machine.   She also describes skin lesions that are chronic in nature.  She states that she has pain and bleeding from multiple sites.  No purulent discharge.  No fevers.  No other associated or modifying symptoms.    Past Medical History:  Diagnosis Date  . Anxiety   . Arthritis   . Asthma   . Chronic diastolic CHF (congestive heart failure) ([place])   . COPD (chronic obstructive pulmonary disease) (HCC)    Uses Oxygen at night  . Depression   . Diabetes mellitus without complication ([place])   . Dyspnea    occ  . Family history of thyroid cancer   . GERD (gastroesophageal reflux disease)   . Gout   . Headache    migraines  . History of DVT of lower extremity   . History of nuclear stress test    Myoview 2/17:  Low risk stress nuclear study with a small, moderate intensity, partially reversible inferior lateral defect consistent with small prior infarct and minimal peri-infarct ischemia; EF 68 with normal wall motion  . Hypertension    . Pneumonia   . Thyroid cancer ([place]) 09/23/2016    Patient Active Problem List   Diagnosis Date Noted  . Morbid obesity with BMI of 50.0-59.9, adult ([place])   . Fall at home, initial encounter 06/23/2018  . HNP (herniated nucleus pulposus), thoracic 06/23/2018  . CHF (congestive heart failure) ([place]) 06/18/2018  . Hypertensive disorder 08/28/2017  . Obesity 08/28/2017  . COPD exacerbation ([place]) 08/04/2017  . Acute and chronic respiratory failure with hypercapnia ([place])   . Pulmonary HTN ([place])   . Genetic testing 02/27/2017  . Family history of thyroid cancer   . Hypomagnesemia 10/03/2016  . Atypical chest pain   . HLD (hyperlipidemia) 09/23/2016  . Gout 09/23/2016  . DVT (deep vein thrombosis) in pregnancy 09/23/2016  . Thyroid cancer ([name]) 09/23/2016  . Hypocalcemia 09/23/2016  . AKI (acute kidney injury) ([place]) 09/23/2016  . Acute pulmonary edema (HCC)   . Acute hypoxemic respiratory failure ([place])   . Ventilator dependent ([place])   . Respiratory failure ([place]) 07/16/2016  . CAP (community acquired pneumonia) 01/23/2016  . 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 Pulmonary hypertension ([place]) 01/23/2016  . Obesity hypoventilation syndrome ([place]) 08/26/2015  . Dyslipidemia associated with type 2 diabetes mellitus ([place]) 04/24/2015  . Leukocytosis 04/24/2015  . Controlled type 2 diabetes mellitus without complication, without long-term current  use of insulin ([place]) 04/24/2015  . Anxiety 04/24/2015  . Benign essential HTN 04/24/2015  . Normocytic anemia 04/24/2015  . OSA (obstructive sleep apnea) 05/10/2012  . Tobacco abuse 07/04/2011    Past Surgical History:  Procedure Laterality Date  . BUNIONECTOMY Bilateral   . CARDIAC CATHETERIZATION N/A 06/23/2015   Procedure: Right/Left Heart Cath and Coronary Angiography;  Surgeon: [name], MD;  Location: [facility];  Service: Cardiovascular;  Laterality: N/A;  . COLONOSCOPY WITH PROPOFOL N/A  12/15/2015   Procedure: COLONOSCOPY WITH PROPOFOL;  Surgeon: [name], MD;  Location: [facility];  Service: Endoscopy;  Laterality: N/A;  . THYROIDECTOMY  09/19/2016  . THYROIDECTOMY N/A 09/19/2016   Procedure: TOTAL THYROIDECTOMY;  Surgeon: [name], MD;  Location: [facility];  Service: General;  Laterality: N/A;  . TONSILLECTOMY    . TOTAL ABDOMINAL HYSTERECTOMY  07/14/10    Current Outpatient Rx  . Order #: [id number] Class: Normal  . Order #: [id number] Class: Historical Med  . Order #: [id number] Class: Historical Med  . Order #: [id number] Class: Historical Med  . Order #: [id number] Class: Historical Med  . Order #: [id number] Class: Normal  . Order #: [id number] Class: Normal  . Order #: [id number] Class: Historical Med  . Order #: [id number] Class: Normal  . Order #: [id number] Class: Normal  . Order #: [id number] Class: Historical Med  . Order #: [id number] Class: Historical Med  . Order #: [id number] Class: Normal  . Order #: [id number] Class: Normal  . Order #: [id number] Class: Normal  . Order #: [id number] Class: Historical Med  . Order #: [id number] Class: Historical Med  . Order #: [id number] Class: Historical Med  . Order #: [id number] Class: Normal  . Order #: [id number] Class: Normal  . Order #: [id number] Class: Print  . Order #: [id number] Class: Normal  . Order #: [id number] Class: Normal  . Order #: [id number] Class: Print  . Order #: [id number] Class: Historical Med  . Order #: [id number] Class: Normal  . Order #: [id number] Class: Normal  . Order #: [id number] Class: Historical Med  . Order #: [id number] Class: Historical Med  . Order #: [id number] Class: Historical Med  . Order #: [id number] Class: Normal  . Order #: [id number] Class: Historical Med  . Order #: [id number] Class: Print  . Order #: [id number] Class: Historical Med  . Order #: [id number] Class: Historical Med    Allergies Bee venom, Fioricet [butalbital-apap-caffeine], Ibuprofen, Lamisil [terbinafine], and Nsaids  Family History  Problem Relation  Age of Onset  . Cancer Father        thought to be due to exposure to concrete  . Diabetes Mother   . Thyroid cancer Mother        dx in her 33s-60s  . Cancer Maternal Uncle        2 uncles with cancer NOS  . Brain cancer Paternal Aunt   . Cancer Cousin        maternal first cousin - NOS  . Cancer Cousin        maternal first cousin - NOS    Social History Social History   Tobacco Use  . Smoking status: Former Smoker    Packs/day: 0.50    Years: 41.00    Pack years: 20.50    Types: Cigarettes    Quit date: 07/2016    Years since quitting: 2.9  . Smokeless tobacco: Never Used  . Tobacco comment: Starting to Wean Off -- using Nicotine Patch  Substance Use Topics  . Alcohol use: Not Currently    Alcohol/week: 0.0 standard drinks    Comment: ? none now  . Drug use:  Never    Comment: none for long time    Review of Systems  All other systems negative except as documented in the HPI. All pertinent positives and negatives as reviewed in the HPI. ____________________________________________   PHYSICAL EXAM:  VITAL SIGNS: ED Triage Vitals  Enc Vitals Group     BP 06/25/19 2256 103/61     Pulse Rate 06/25/19 2256 (!) 101     Resp 06/25/19 2256 (!) 23     Temp 06/25/19 2256 98.4 F (36.9 C)     Temp Source 06/25/19 2256 Oral     SpO2 06/25/19 2255 98 %     Weight 06/25/19 2257 297 lb (134.7 kg)     Height 06/25/19 2257 5\' 5"  (1.651 m)    Constitutional: Alert and oriented. Well appearing and in no acute distress. Eyes: Conjunctivae are normal. PERRL. EOMI. Head: Atraumatic. Nose: No congestion/rhinnorhea. Mouth/Throat: Mucous membranes are moist.  Oropharynx non-erythematous. Neck: No stridor.  No meningeal signs.   Cardiovascular: Normal rate, regular rhythm. Good peripheral circulation. Grossly normal heart sounds.   Respiratory: Normal respiratory effort.  No retractions. Lungs CTAB. Gastrointestinal: Soft and nontender. No distention.  Musculoskeletal: No  lower extremity tenderness nor edema. No gross deformities of extremities. Neurologic:  Normal speech and language. No gross focal neurologic deficits are appreciated.  Skin: Multiple areas of indurated skin and serous drainage in her skin folds underneath her belly and underneath her breasts bilaterally..   ____________________________________________   LABS (all labs ordered are listed, but only abnormal results are displayed)  Labs Reviewed  CBC WITH DIFFERENTIAL/PLATELET - Abnormal; Notable for the following components:      Result Value   WBC 14.9 (*)    Hemoglobin 11.4 (*)    Neutro Abs 11.1 (*)    Abs Immature Granulocytes 0.09 (*)    All other components within normal limits  COMPREHENSIVE METABOLIC PANEL - Abnormal; Notable for the following components:   Glucose, Bld 180 (*)    Calcium 8.3 (*)    All other components within normal limits  BRAIN NATRIURETIC PEPTIDE  D-DIMER, QUANTITATIVE (NOT AT [facility])  TROPONIN I (HIGH SENSITIVITY)  TROPONIN I (HIGH SENSITIVITY)   ____________________________________________  EKG   EKG Interpretation  Date/Time:  Wednesday June 25 2019 22:51:24 EDT Ventricular Rate:  99 PR Interval:    QRS Duration: 79 QT Interval:  346 QTC Calculation: 444 R Axis:   46 Text Interpretation: Sinus rhythm Borderline prolonged PR interval Low voltage, precordial leads No significant change since last tracing Confirmed by Merrily Pew [phone]) on 06/25/2019 11:15:42 PM       ____________________________________________  RADIOLOGY  DG Chest 2 View  Result Date: 06/25/2019 CLINICAL DATA:  59 year old female with chest pain. EXAM: CHEST - 2 VIEW COMPARISON:  Chest radiograph dated 06/23/2019. FINDINGS: Evaluation is limited due to body habitus. There is shallow inspiration with bibasilar atelectasis. Infiltrate is not excluded. Clinical correlation is recommended. No large pleural effusion or pneumothorax. Stable cardiomegaly. Prominent pulmonary  vasculature likely representing a degree of pulmonary hypertension. No acute osseous pathology. Thyroidectomy surgical clips. IMPRESSION: 1. Shallow inspiration with bibasilar atelectasis versus infiltrate. 2. Stable cardiomegaly. Electronically Signed   By: Anner Crete M.D.   On: 06/25/2019 23:30    ____________________________________________   PROCEDURES  Procedure(s) performed:   Procedures   ____________________________________________   INITIAL IMPRESSION / ASSESSMENT AND PLAN / ED COURSE  We will offer symptomatic treatment and supportive care as necessary.  Rule out for PE and ACS.  Suspect anxiety versus COPD exacerbation.  We will continue to treat for the same.  Discharge.  Had some documented low oxygen's but this is when she took her chronic nasal cannula off and went to sleep.  With her baseline home oxygen she is stable.     Pertinent labs & imaging results that were available during my care of the patient were reviewed by me and considered in my medical decision making (see chart for details).  A medical screening exam was performed and I feel the patient has had an appropriate workup for their chief complaint at this time and likelihood of emergent condition existing is low. They have been counseled on decision, discharge, follow up and which symptoms necessitate immediate return to the emergency department. They or their family verbally stated understanding and agreement with plan and discharged in stable condition.   ____________________________________________  FINAL CLINICAL IMPRESSION(S) / ED DIAGNOSES  Final diagnoses:  Atypical chest pain  Shortness of breath  Chronic obstructive pulmonary disease, unspecified COPD type (HCC)     MEDICATIONS GIVEN DURING THIS VISIT:  Medications  ipratropium-albuterol (DUONEB) 0.5-2.5 (3) MG/3ML nebulizer solution 3 mL (3 mLs Nebulization Not Given 06/26/19 0305)  fentaNYL (SUBLIMAZE) injection 50 mcg (50 mcg  Intravenous Given 06/25/19 2337)     NEW OUTPATIENT MEDICATIONS STARTED DURING THIS VISIT:  New Prescriptions   PREDNISONE (DELTASONE) 20 MG TABLET    3 tabs po day one, then 2 po daily x 4 days    Note:  This note was prepared with assistance of Dragon voice recognition software. Occasional wrong-word or sound-a-like substitutions may have occurred due to the inherent limitations of voice recognition software.   Alee Gressman, Corene Cornea, MD 06/26/19 [phone]

## 2019-06-26 ENCOUNTER — Telehealth: Payer: Self-pay | Admitting: *Deleted

## 2019-06-26 LAB — COMPREHENSIVE METABOLIC PANEL
ALT: 16 U/L (ref 0–44)
AST: 20 U/L (ref 15–41)
Albumin: 3.5 g/dL (ref 3.5–5.0)
Alkaline Phosphatase: 66 U/L (ref 38–126)
Anion gap: 12 (ref 5–15)
BUN: 9 mg/dL (ref 6–20)
CO2: 27 mmol/L (ref 22–32)
Calcium: 8.3 mg/dL — ABNORMAL LOW (ref 8.9–10.3)
Chloride: 101 mmol/L (ref 98–111)
Creatinine, Ser: 0.62 mg/dL (ref 0.44–1.00)
GFR calc Af Amer: >60 mL/min (ref >60)
GFR calc non Af Amer: >60 mL/min (ref >60)
Glucose, Bld: 180 mg/dL — ABNORMAL HIGH (ref 70–99)
Potassium: 4.2 mmol/L (ref 3.5–5.1)
Sodium: 140 mmol/L (ref 135–145)
Total Bilirubin: 0.4 mg/dL (ref 0.3–1.2)
Total Protein: 6.5 g/dL (ref 6.5–8.1)

## 2019-06-26 LAB — TROPONIN I (HIGH SENSITIVITY)
Troponin I (High Sensitivity): 3 ng/L (ref <18)
Troponin I (High Sensitivity): 5 ng/L (ref <18)

## 2019-06-26 LAB — BRAIN NATRIURETIC PEPTIDE: B Natriuretic Peptide: 33.2 pg/mL (ref 0.0–100.0)

## 2019-06-26 MED ORDER — PREDNISONE 20 MG PO TABS: ORAL_TABLET | ORAL | 0 refills | Status: DC

## 2019-06-26 NOTE — Telephone Encounter (Signed)
Pharmacy called regarding pt receiving written Rx and she uses delivery services.  Pharmacy asked for verbal Rx and they will retrieve written when they deliver Rx to pt home.  RNCM read Rx as written.

## 2019-06-26 NOTE — ED Notes (Signed)
Called [facility] for transport home--Michele Owens 

## 2019-06-28 ENCOUNTER — Other Ambulatory Visit: Payer: Self-pay

## 2019-06-28 ENCOUNTER — Emergency Department (HOSPITAL_COMMUNITY)
Admission: EM | Admit: 2019-06-28 | Discharge: 2019-06-28 | Disposition: A | Discharge status: Home / Self Care | Payer: Medicaid Other | Attending: Emergency Medicine | Admitting: Emergency Medicine

## 2019-06-28 ENCOUNTER — Encounter (HOSPITAL_COMMUNITY): Payer: Self-pay | Admitting: Emergency Medicine

## 2019-06-28 ENCOUNTER — Emergency Department (HOSPITAL_COMMUNITY): Payer: Medicaid Other

## 2019-06-28 DIAGNOSIS — R519 Headache, unspecified: Secondary | ICD-10-CM | POA: Diagnosis present

## 2019-06-28 DIAGNOSIS — Z7984 Long term (current) use of oral hypoglycemic drugs: Secondary | ICD-10-CM | POA: Diagnosis not present

## 2019-06-28 DIAGNOSIS — Z85850 Personal history of malignant neoplasm of thyroid: Secondary | ICD-10-CM | POA: Insufficient documentation

## 2019-06-28 DIAGNOSIS — Z79899 Other long term (current) drug therapy: Secondary | ICD-10-CM | POA: Insufficient documentation

## 2019-06-28 DIAGNOSIS — E119 Type 2 diabetes mellitus without complications: Secondary | ICD-10-CM | POA: Insufficient documentation

## 2019-06-28 DIAGNOSIS — I110 Hypertensive heart disease with heart failure: Secondary | ICD-10-CM | POA: Insufficient documentation

## 2019-06-28 DIAGNOSIS — Z87891 Personal history of nicotine dependence: Secondary | ICD-10-CM | POA: Insufficient documentation

## 2019-06-28 DIAGNOSIS — R0789 Other chest pain: Secondary | ICD-10-CM

## 2019-06-28 DIAGNOSIS — J45909 Unspecified asthma, uncomplicated: Secondary | ICD-10-CM | POA: Diagnosis not present

## 2019-06-28 DIAGNOSIS — J449 Chronic obstructive pulmonary disease, unspecified: Secondary | ICD-10-CM | POA: Diagnosis not present

## 2019-06-28 DIAGNOSIS — I5032 Chronic diastolic (congestive) heart failure: Secondary | ICD-10-CM | POA: Diagnosis not present

## 2019-06-28 LAB — CBC WITH DIFFERENTIAL/PLATELET
Abs Immature Granulocytes: 0.26 10*3/uL — ABNORMAL HIGH (ref 0.00–0.07)
Basophils Absolute: 0 10*3/uL (ref 0.0–0.1)
Basophils Relative: 0 %
Eosinophils Absolute: 0 10*3/uL (ref 0.0–0.5)
Eosinophils Relative: 0 %
HCT: 36.7 % (ref 36.0–46.0)
Hemoglobin: 11.3 g/dL — ABNORMAL LOW (ref 12.0–15.0)
Immature Granulocytes: 1 %
Lymphocytes Relative: 8 %
Lymphs Abs: 2.1 10*3/uL (ref 0.7–4.0)
MCH: 28.7 pg (ref 26.0–34.0)
MCHC: 30.8 g/dL (ref 30.0–36.0)
MCV: 93.1 fL (ref 80.0–100.0)
Monocytes Absolute: 1.2 10*3/uL — ABNORMAL HIGH (ref 0.1–1.0)
Monocytes Relative: 5 %
Neutro Abs: 23.2 10*3/uL — ABNORMAL HIGH (ref 1.7–7.7)
Neutrophils Relative %: 86 %
Platelets: 297 10*3/uL (ref 150–400)
RBC: 3.94 MIL/uL (ref 3.87–5.11)
RDW: 13.4 % (ref 11.5–15.5)
WBC: 26.8 10*3/uL — ABNORMAL HIGH (ref 4.0–10.5)
nRBC: 0 % (ref 0.0–0.2)

## 2019-06-28 LAB — LIPASE, BLOOD: Lipase: 20 U/L (ref 11–51)

## 2019-06-28 LAB — TROPONIN I (HIGH SENSITIVITY)
Troponin I (High Sensitivity): 4 ng/L
Troponin I (High Sensitivity): 5 ng/L (ref <18)

## 2019-06-28 LAB — COMPREHENSIVE METABOLIC PANEL WITH GFR
ALT: 19 U/L (ref 0–44)
AST: 16 U/L (ref 15–41)
Albumin: 3.8 g/dL (ref 3.5–5.0)
Alkaline Phosphatase: 66 U/L (ref 38–126)
Anion gap: 10 (ref 5–15)
BUN: 11 mg/dL (ref 6–20)
CO2: 28 mmol/L (ref 22–32)
Calcium: 8.9 mg/dL (ref 8.9–10.3)
Chloride: 101 mmol/L (ref 98–111)
Creatinine, Ser: 0.57 mg/dL (ref 0.44–1.00)
GFR calc Af Amer: >60 mL/min
GFR calc non Af Amer: >60 mL/min
Glucose, Bld: 158 mg/dL — ABNORMAL HIGH (ref 70–99)
Potassium: 4.1 mmol/L (ref 3.5–5.1)
Sodium: 139 mmol/L (ref 135–145)
Total Bilirubin: 0.3 mg/dL (ref 0.3–1.2)
Total Protein: 7.3 g/dL (ref 6.5–8.1)

## 2019-06-28 LAB — BRAIN NATRIURETIC PEPTIDE: B Natriuretic Peptide: 31.8 pg/mL (ref 0.0–100.0)

## 2019-06-28 MED ORDER — IOHEXOL 350 MG/ML SOLN
100.0000 mL | Freq: Once | INTRAVENOUS | Status: AC | PRN
  Administered 2019-06-28: 100 mL via INTRAVENOUS

## 2019-06-28 MED ORDER — MORPHINE SULFATE (PF) 4 MG/ML IV SOLN
4.0000 mg | Freq: Once | INTRAVENOUS | Status: AC
  Administered 2019-06-28: 4 mg via INTRAVENOUS
  Filled 2019-06-28: qty 1

## 2019-06-28 MED ORDER — OXYCODONE-ACETAMINOPHEN 5-325 MG PO TABS
2.0000 | ORAL_TABLET | Freq: Once | ORAL | Status: AC
  Administered 2019-06-28: 2 via ORAL
  Filled 2019-06-28: qty 2

## 2019-06-28 MED ORDER — SODIUM CHLORIDE 0.9 % IV BOLUS
1000.0000 mL | Freq: Once | INTRAVENOUS | Status: AC
  Administered 2019-06-28: 1000 mL via INTRAVENOUS

## 2019-06-28 MED ORDER — ONDANSETRON HCL 4 MG/2ML IJ SOLN
4.0000 mg | Freq: Once | INTRAMUSCULAR | Status: AC
  Administered 2019-06-28: 01:00:00 4 mg via INTRAVENOUS
  Filled 2019-06-28: qty 2

## 2019-06-28 NOTE — ED Provider Notes (Signed)
[facility] EMERGENCY DEPARTMENT Provider Note   CSN: [id number] Arrival date & time: 06/28/19  0001     History Chief Complaint  Patient presents with  . Chest Pain    Michele Owens is a 59 y.o. female.  Patient brought to the ER by ambulance from [facility].  Patient reports headache and chest pain.  She apparently became upset while at [facility] and this precipitated the symptoms.  Patient reports that this is the third time she has been in the ER in the last week or so with complaints.  Patient reports pain in the upper chest that moves around to the left and then the right, occasionally into her arm.  She has nausea but no diaphoresis.  Patient reports a global headache.  She reports that her chest pain is now a 12 out of 10.  Patient is tearful.        Past Medical History:  Diagnosis Date  . Anxiety   . Arthritis   . Asthma   . Chronic diastolic CHF (congestive heart failure) ([place])   . COPD (chronic obstructive pulmonary disease) (HCC)    Uses Oxygen at night  . Depression   . Diabetes mellitus without complication ([place])   . Dyspnea    occ  . Family history of thyroid cancer   . GERD (gastroesophageal reflux disease)   . Gout   . Headache    migraines  . History of DVT of lower extremity   . History of nuclear stress test    Myoview 2/17:  Low risk stress nuclear study with a small, moderate intensity, partially reversible inferior lateral defect consistent with small prior infarct and minimal peri-infarct ischemia; EF 68 with normal wall motion  . Hypertension   . Pneumonia   . Thyroid cancer ([place]) 09/23/2016    Patient Active Problem List   Diagnosis Date Noted  . Morbid obesity with BMI of 50.0-59.9, adult ([place])   . Fall at home, initial encounter 06/23/2018  . HNP (herniated nucleus pulposus), thoracic 06/23/2018  . CHF (congestive heart failure) ([place]) 06/18/2018  . Hypertensive disorder 08/28/2017  . Obesity 08/28/2017  . COPD  exacerbation ([place]) 08/04/2017  . Acute and chronic respiratory failure with hypercapnia ([place])   . Pulmonary HTN ([place])   . Genetic testing 02/27/2017  . Family history of thyroid cancer   . Hypomagnesemia 10/03/2016  . Atypical chest pain   . HLD (hyperlipidemia) 09/23/2016  . Gout 09/23/2016  . DVT (deep vein thrombosis) in pregnancy 09/23/2016  . Thyroid cancer ([place]) 09/23/2016  . Hypocalcemia 09/23/2016  . AKI (acute kidney injury) ([place]) 09/23/2016  . Acute pulmonary edema (HCC)   . Acute hypoxemic respiratory failure ([place])   . Ventilator dependent ([place])   . Respiratory failure ([place]) 07/16/2016  . CAP (community acquired pneumonia) 01/23/2016  . 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 Pulmonary hypertension ([place]) 01/23/2016  . Obesity hypoventilation syndrome ([place]) 08/26/2015  . Dyslipidemia associated with type 2 diabetes mellitus ([place]) 04/24/2015  . Leukocytosis 04/24/2015  . Controlled type 2 diabetes mellitus without complication, without long-term current use of insulin ([place]) 04/24/2015  . Anxiety 04/24/2015  . Benign essential HTN 04/24/2015  . Normocytic anemia 04/24/2015  . OSA (obstructive sleep apnea) 05/10/2012  . Tobacco abuse 07/04/2011    Past Surgical History:  Procedure Laterality Date  . BUNIONECTOMY Bilateral   . CARDIAC CATHETERIZATION N/A 06/23/2015   Procedure: Right/Left Heart Cath and Coronary Angiography;  Surgeon: Elby Showers  Aundra Dubin, MD;  Location: [facility];  Service: Cardiovascular;  Laterality: N/A;  . COLONOSCOPY WITH PROPOFOL N/A 12/15/2015   Procedure: COLONOSCOPY WITH PROPOFOL;  Surgeon: [name], MD;  Location: [facility];  Service: Endoscopy;  Laterality: N/A;  . THYROIDECTOMY  09/19/2016  . THYROIDECTOMY N/A 09/19/2016   Procedure: TOTAL THYROIDECTOMY;  Surgeon: [name], MD;  Location: [facility];  Service: General;  Laterality: N/A;  . TONSILLECTOMY    . TOTAL ABDOMINAL HYSTERECTOMY  07/14/10     OB  History   No obstetric history on file.     Family History  Problem Relation Age of Onset  . Cancer Father        thought to be due to exposure to concrete  . Diabetes Mother   . Thyroid cancer Mother        dx in her 41s-60s  . Cancer Maternal Uncle        2 uncles with cancer NOS  . Brain cancer Paternal Aunt   . Cancer Cousin        maternal first cousin - NOS  . Cancer Cousin        maternal first cousin - NOS    Social History   Tobacco Use  . Smoking status: Former Smoker    Packs/day: 0.50    Years: 41.00    Pack years: 20.50    Types: Cigarettes    Quit date: 07/2016    Years since quitting: 2.9  . Smokeless tobacco: Never Used  . Tobacco comment: Starting to Wean Off -- using Nicotine Patch  Substance Use Topics  . Alcohol use: Not Currently    Alcohol/week: 0.0 standard drinks    Comment: ? none now  . Drug use: Never    Comment: none for long time    Home Medications Prior to Admission medications   Medication Sig Start Date End Date Taking? Authorizing Provider  albuterol (PROVENTIL HFA;VENTOLIN HFA) 108 (90 Base) MCG/ACT inhaler Inhale 1-2 puffs into the lungs every 6 (six) hours as needed for wheezing or shortness of breath. 04/23/18   Long, [name], MD  allopurinol (ZYLOPRIM) 100 MG tablet Take 100 mg by mouth daily.    [provider]  ANORO ELLIPTA 62.5-25 MCG/INH AEPB Inhale 1 puff into the lungs daily.  04/11/18   [provider]  Blood Glucose Monitoring Suppl (ACCU-CHEK AVIVA PLUS) w/Device KIT 1 each by Other route daily.  08/07/17   [provider]  butalbital-acetaminophen-caffeine (FIORICET, ESGIC) 50-325-40 MG tablet Take 1-2 tablets by mouth every 4 (four) hours as needed for migraine. 06/13/18   [provider]  calcitRIOL (ROCALTROL) 0.5 MCG capsule Take 1 capsule (0.5 mcg total) by mouth daily. 09/26/16   Hongalgi, [name], MD  carvedilol (COREG) 6.25 MG tablet Take 1 tablet (6.25 mg total) by mouth 2 (two)  times daily with a meal. 01/23/18   Bensimhon, [name], MD  cetirizine (ZYRTEC) 10 MG tablet Take 10 mg by mouth daily. 07/10/17   [provider]  clonazePAM (KLONOPIN) 2 MG tablet Take 1 tablet (2 mg total) by mouth 3 (three) times daily. for anxiety 06/23/19   [name], MD  diclofenac sodium (VOLTAREN) 1 % GEL Apply 2 g topically 4 (four) times daily as needed (for pain). Patient not taking: Reported on 06/18/2019 06/19/18   [name], MD  EPINEPHrine 0.3 mg/0.3 mL IJ SOAJ injection Inject 0.3 mg into the muscle daily as needed (allergic reaction).  [provider]  FEROSUL 325 (65 Fe) MG tablet Take 325 mg by mouth daily. 03/22/17   [provider]  furosemide (LASIX) 20 MG tablet Take 2 tablets (40 mg total) by mouth 2 (two) times daily. Take 3m every morning and 465mevery evening Patient taking differently: Take 40 mg by mouth 2 (two) times daily. Take 4099mvery morning and 50m26mery evening 07/24/18   McLeLarey Dresser  glucose blood test strip Use as instructed to check blood sugar up to 4 times daily 08/06/17   ChoiDessa Phi  guaiFENesin (MUCINEX) 600 MG 12 hr tablet Take 2 tablets (1,200 mg total) by mouth 2 (two) times daily. 07/11/18   Xu, Florencia Reasons  HYDROcodone-acetaminophen (NORCO/VICODIN) 5-325 MG tablet Take 1 tablet by mouth every 6 (six) hours as needed for moderate pain.  02/12/18   [provider]  ipratropium-albuterol (DUONEB) 0.5-2.5 (3) MG/3ML SOLN Take 3 mLs by nebulization 4 (four) times daily.  04/05/17   [provider]  levothyroxine (SYNTHROID, LEVOTHROID) 112 MCG tablet Take 224 mcg by mouth daily. 07/11/17   [provider]  lidocaine (LIDODERM) 5 % Place 1 patch onto the skin daily. Remove & Discard patch within 12 hours or as directed by MD 07/11/18   Xu, Florencia Reasons  lisinopril (PRINIVIL,ZESTRIL) 5 MG tablet Take 1 tablet (5 mg total) by mouth daily. Please call for office visit [phone] 08/15/17    McLeLarey Dresser  loperamide (IMODIUM) 2 MG capsule Take 1 capsule (2 mg total) by mouth 4 (four) times daily as needed for diarrhea or loose stools. 7/160/37/04licDelora Fuel  metFORMIN (GLUCOPHAGE) 500 MG tablet Take 1 tablet (500 mg total) by mouth 2 (two) times daily with a meal. 12/11/14   SullDelfina Redwood  methocarbamol (ROBAXIN) 500 MG tablet Take 1 tablet (500 mg total) by mouth every 8 (eight) hours as needed for muscle spasms. 06/19/18   SmitNorval Morton  metoCLOPramide (REGLAN) 10 MG tablet Take 1 tablet (10 mg total) by mouth every 6 (six) hours as needed for nausea (or headache). 7/168/88/91licDelora Fuel  metoprolol succinate (TOPROL-XL) 25 MG 24 hr tablet Take 25 mg by mouth in the morning.  08/09/18   [provider]  montelukast (SINGULAIR) 10 MG tablet Take 1 tablet (10 mg total) by mouth at bedtime. 07/11/18   Xu, Florencia Reasons  nitroGLYCERIN (NITROSTAT) 0.4 MG SL tablet Place 1 tablet (0.4 mg total) under the tongue every 5 (five) minutes as needed for chest pain. Patient not taking: Reported on 06/05/2019 06/19/18   SmitNorval Morton  nystatin cream (MYCOSTATIN) Apply 1 application topically 2 (two) times daily as needed for dry skin.  04/13/17   [provider]  omeprazole (PRILOSEC) 40 MG capsule Take 40 mg by mouth daily. 04/13/15   [provider]  OXYGEN Inhale 2 L into the lungs at bedtime.    [provider]  potassium chloride SA (K-DUR,KLOR-CON) 20 MEQ tablet Take 1 tablet (20 mEq total) by mouth 2 (two) times daily. 07/24/18   McLeLarey Dresser  pravastatin (PRAVACHOL) 40 MG tablet Take 40 mg by mouth every evening.  03/29/15   [provider]  predniSONE (DELTASONE) 20 MG tablet 3 tabs po day one, then 2 po daily x 4 days 06/26/19   Mesner, JasoCorene Cornea  QC STOOL SOFTENER PLS LAXATIVE 8.6-50 MG tablet Take 1 tablet by mouth at bedtime.  04/11/17   [provider]  tiotropium (SPIRIVA) 18 MCG inhalation capsule Place 18  mcg into inhaler and inhale daily.    [provider]    Allergies    Bee venom, Fioricet [butalbital-apap-caffeine], Ibuprofen, Lamisil [terbinafine], and Nsaids  Review of Systems   Review of Systems  Cardiovascular: Positive for chest pain.  Gastrointestinal: Positive for nausea.  Neurological: Positive for headaches.  All other systems reviewed and are negative.   Physical Exam Updated Vital Signs BP 126/64   Pulse 73   Temp 98.2 F (36.8 C) (Oral)   Resp 17   Ht '5\' 5"'  (1.651 m)   Wt 134.7 kg   SpO2 100%   BMI 49.42 kg/m   Physical Exam Vitals and nursing note reviewed.  Constitutional:      General: She is not in acute distress.    Appearance: Normal appearance. She is well-developed.  HENT:     Head: Normocephalic and atraumatic.     Right Ear: Hearing normal.     Left Ear: Hearing normal.     Nose: Nose normal.  Eyes:     Conjunctiva/sclera: Conjunctivae normal.     Pupils: Pupils are equal, round, and reactive to light.  Cardiovascular:     Rate and Rhythm: Regular rhythm.     Heart sounds: S1 normal and S2 normal. No murmur. No friction rub. No gallop.   Pulmonary:     Effort: Pulmonary effort is normal. No respiratory distress.     Breath sounds: Normal breath sounds.  Chest:     Chest wall: No tenderness.  Abdominal:     General: Bowel sounds are normal.     Palpations: Abdomen is soft.     Tenderness: There is no abdominal tenderness. There is no guarding or rebound. Negative signs include Murphy's sign and McBurney's sign.     Hernia: No hernia is present.  Musculoskeletal:        General: Normal range of motion.     Cervical back: Normal range of motion and neck supple.  Skin:    General: Skin is warm and dry.     Findings: No rash.  Neurological:     Mental Status: She is alert and oriented to person, place, and time.     GCS: GCS eye subscore is 4. GCS verbal subscore is 5. GCS motor subscore is 6.     Cranial Nerves: No cranial  nerve deficit.     Sensory: No sensory deficit.     Coordination: Coordination normal.  Psychiatric:        Speech: Speech normal.        Behavior: Behavior normal.        Thought Content: Thought content normal.     ED Results / Procedures / Treatments   Labs (all labs ordered are listed, but only abnormal results are displayed) Labs Reviewed  CBC WITH DIFFERENTIAL/PLATELET - Abnormal; Notable for the following components:      Result Value   WBC 26.8 (*)    Hemoglobin 11.3 (*)    Neutro Abs 23.2 (*)    Monocytes Absolute 1.2 (*)    Abs Immature Granulocytes 0.26 (*)    All other components within normal limits  COMPREHENSIVE METABOLIC PANEL - Abnormal; Notable for the following components:   Glucose, Bld 158 (*)    All other components within normal limits  LIPASE, BLOOD  BRAIN NATRIURETIC PEPTIDE  TROPONIN I (HIGH SENSITIVITY)  TROPONIN I (HIGH SENSITIVITY)  EKG EKG Interpretation  Date/Time:  Saturday June 28 2019 00:14:54 EDT Ventricular Rate:  76 PR Interval:    QRS Duration: 82 QT Interval:  381 QTC Calculation: 429 R Axis:   44 Text Interpretation: Sinus rhythm Low voltage, precordial leads No significant change since last tracing Confirmed by [name] [phone]) on 06/28/2019 3:58:37 AM   Radiology CT HEAD WO CONTRAST  Result Date: 06/28/2019 CLINICAL DATA:  Headache EXAM: CT HEAD WITHOUT CONTRAST TECHNIQUE: Contiguous axial images were obtained from the base of the skull through the vertex without intravenous contrast. COMPARISON:  10/30/2018 FINDINGS: Brain: No acute intracranial abnormality. Specifically, no hemorrhage, hydrocephalus, mass lesion, acute infarction, or significant intracranial injury. Vascular: No hyperdense vessel or unexpected calcification. Skull: No acute calvarial abnormality. Sinuses/Orbits: Visualized paranasal sinuses and mastoids clear. Orbital soft tissues unremarkable. Other: None IMPRESSION: Normal study.  Electronically Signed   By: Rolm Baptise M.D.   On: 06/28/2019 01:46   CT ANGIO CHEST/ABD/PEL FOR DISSECTION W &/OR WO CONTRAST  Result Date: 06/28/2019 CLINICAL DATA:  Chest pain radiating to arms and legs with nausea. No vomiting. EXAM: CT ANGIOGRAPHY CHEST, ABDOMEN AND PELVIS TECHNIQUE: Multidetector CT imaging through the chest, abdomen and pelvis was performed using the standard protocol during bolus administration of intravenous contrast. Multiplanar reconstructed images and MIPs were obtained and reviewed to evaluate the vascular anatomy. CONTRAST:  110m OMNIPAQUE IOHEXOL 350 MG/ML SOLN COMPARISON:  Chest x-ray June 25, 2019. CT angiogram of the chest June 09, 2018. CT of the abdomen June 06, 2017. FINDINGS: CTA CHEST FINDINGS Cardiovascular: Atherosclerotic change is seen in the thoracic aorta without aneurysm or dissection. The main pulmonary artery is normal in caliber. No central filling defects in the pulmonary arteries. Cardiomegaly. Mediastinum/Nodes: The patient appears to be status post thyroidectomy. The esophagus is normal. Chest wall is normal. A mildly prominent node in the lateral right axilla is stable. No adenopathy. No pleural or pericardial effusions. Lungs/Pleura: Central airways are normal. No pneumothorax. Bibasilar atelectasis. No suspicious infiltrates. No nodules or masses. Musculoskeletal: No chest wall abnormality. No acute or significant osseous findings. Review of the MIP images confirms the above findings. CTA ABDOMEN AND PELVIS FINDINGS VASCULAR Aorta: Normal caliber aorta without aneurysm, dissection, vasculitis or significant stenosis. Celiac: Patent without evidence of aneurysm, dissection, vasculitis or significant stenosis. SMA: Patent without evidence of aneurysm, dissection, vasculitis or significant stenosis. Renals: Both renal arteries are patent without evidence of aneurysm, dissection, vasculitis, fibromuscular dysplasia or significant stenosis. IMA: Patent  without evidence of aneurysm, dissection, vasculitis or significant stenosis. Inflow: Patent without evidence of aneurysm, dissection, vasculitis or significant stenosis. Veins: No obvious venous abnormality within the limitations of this arterial phase study. Review of the MIP images confirms the above findings. NON-VASCULAR Hepatobiliary: Evaluation is limited due to arterial phase imaging. No abnormalities are identified. Pancreas: Evaluation is limited due to arterial phase imaging with no identified abnormalities. Spleen: Normal in size without focal abnormality. Adrenals/Urinary Tract: Adrenal glands are unremarkable. Kidneys are normal, without renal calculi, focal lesion, or hydronephrosis. Bladder is unremarkable. Stomach/Bowel: Stomach is within normal limits. Appendix appears normal. No evidence of bowel wall thickening, distention, or inflammatory changes. Lymphatic: No adenopathy identified. Reproductive: Status post hysterectomy. No adnexal masses. Other: No abdominal wall hernia or abnormality. No abdominopelvic ascites. Musculoskeletal: No acute or significant osseous findings. Review of the MIP images confirms the above findings. IMPRESSION: 1. Mild atherosclerotic changes in the thoracic aorta. No aortic aneurysm or dissection. Branching vessels are unremarkable. 2. No other  abnormalities are identified to explain the patient's symptoms. Electronically Signed   By: Dorise Bullion III M.D   On: 06/28/2019 06:52    Procedures Procedures (including critical care time)  Medications Ordered in ED Medications  morphine 4 MG/ML injection 4 mg (4 mg Intravenous Given 06/28/19 0050)  ondansetron (ZOFRAN) injection 4 mg (4 mg Intravenous Given 06/28/19 0050)  sodium chloride 0.9 % bolus 1,000 mL (1,000 mLs Intravenous New Bag/Given 06/28/19 0459)  iohexol (OMNIPAQUE) 350 MG/ML injection 100 mL (100 mLs Intravenous Contrast Given 06/28/19 0606)    ED Course  I have reviewed the triage vital signs  and the nursing notes.  Pertinent labs & imaging results that were available during my care of the patient were reviewed by me and considered in my medical decision making (see chart for details).    MDM Rules/Calculators/A&P                      Patient presents to the emergency department with multiple complaints.  Patient complaining of chest pain.  This is her third visit in 1 week for these complaints.  She has had multiple work-ups previously that did not show any acute pathology.  Once again her EKG is unchanged and troponins are negative.  I did do CT angio of chest abdomen and pelvis to further evaluate.  This was done because she is having continued pain as well as pain into her back and down into her legs.  Neurologic exam is normal.  CT did not show any acute pathology.  She did have a leukocytosis but no pathology seen in the chest abdomen or pelvis that would explain this.  This appears to be somewhat chronic chest pain of unclear etiology but no emergent condition appears to be present.  She also is complaining of headache.  CT head was performed to further evaluate.  No abnormalities noted.  Headache seems to have started after she had some type of an argument with staff at [facility].  This also precipitated her worsening chest pain.  She has a normal neurologic exam.  Treat with analgesia for headache, follow-up with PCP.  Final Clinical Impression(s) / ED Diagnoses Final diagnoses:  Atypical chest pain  Bad headache    Rx / DC Orders ED Discharge Orders    None       Kirstine Jacquin, Gwenyth Allegra, MD 06/28/19 [phone]

## 2019-06-28 NOTE — ED Triage Notes (Signed)
Pt arrived from [facility] via [facility] c/o CP 11/10 which radiates to arms and legs with nausea but denies vomiting. Per pt pain started after pt got upset with [facility] staff. Given 324mg  ASA. Pt also c/o HA.

## 2019-06-28 NOTE — Discharge Instructions (Addendum)
Please schedule follow-up with your primary doctor to further evaluate these conditions.

## 2019-06-28 NOTE — ED Notes (Signed)
Patient verbalizes understanding of discharge instructions. Opportunity for questioning and answers were provided. Armband removed by staff, pt discharged from ED.  

## 2019-07-05 ENCOUNTER — Emergency Department (HOSPITAL_COMMUNITY): Payer: Medicaid Other

## 2019-07-05 ENCOUNTER — Emergency Department (HOSPITAL_COMMUNITY)
Admission: EM | Admit: 2019-07-05 | Discharge: 2019-07-06 | Disposition: A | Discharge status: Home / Self Care | Payer: Medicaid Other | Attending: Emergency Medicine | Admitting: Emergency Medicine

## 2019-07-05 ENCOUNTER — Encounter (HOSPITAL_COMMUNITY): Payer: Self-pay | Admitting: Emergency Medicine

## 2019-07-05 ENCOUNTER — Other Ambulatory Visit: Payer: Self-pay

## 2019-07-05 DIAGNOSIS — E119 Type 2 diabetes mellitus without complications: Secondary | ICD-10-CM | POA: Insufficient documentation

## 2019-07-05 DIAGNOSIS — J449 Chronic obstructive pulmonary disease, unspecified: Secondary | ICD-10-CM | POA: Insufficient documentation

## 2019-07-05 DIAGNOSIS — Z79899 Other long term (current) drug therapy: Secondary | ICD-10-CM | POA: Insufficient documentation

## 2019-07-05 DIAGNOSIS — I110 Hypertensive heart disease with heart failure: Secondary | ICD-10-CM | POA: Diagnosis not present

## 2019-07-05 DIAGNOSIS — Z7984 Long term (current) use of oral hypoglycemic drugs: Secondary | ICD-10-CM | POA: Diagnosis not present

## 2019-07-05 DIAGNOSIS — I509 Heart failure, unspecified: Secondary | ICD-10-CM | POA: Insufficient documentation

## 2019-07-05 DIAGNOSIS — R1084 Generalized abdominal pain: Secondary | ICD-10-CM | POA: Insufficient documentation

## 2019-07-05 DIAGNOSIS — R109 Unspecified abdominal pain: Secondary | ICD-10-CM | POA: Diagnosis present

## 2019-07-05 DIAGNOSIS — Z87891 Personal history of nicotine dependence: Secondary | ICD-10-CM | POA: Insufficient documentation

## 2019-07-05 LAB — CBC
HCT: 37.8 % (ref 36.0–46.0)
Hemoglobin: 11.3 g/dL — ABNORMAL LOW (ref 12.0–15.0)
MCH: 29 pg (ref 26.0–34.0)
MCHC: 29.9 g/dL — ABNORMAL LOW (ref 30.0–36.0)
MCV: 97.2 fL (ref 80.0–100.0)
Platelets: 267 10*3/uL (ref 150–400)
RBC: 3.89 MIL/uL (ref 3.87–5.11)
RDW: 14.1 % (ref 11.5–15.5)
WBC: 18 10*3/uL — ABNORMAL HIGH (ref 4.0–10.5)
nRBC: 0 % (ref 0.0–0.2)

## 2019-07-05 LAB — URINALYSIS, ROUTINE W REFLEX MICROSCOPIC
Bilirubin Urine: NEGATIVE
Glucose, UA: NEGATIVE mg/dL
Hgb urine dipstick: NEGATIVE
Ketones, ur: NEGATIVE mg/dL
Leukocytes,Ua: NEGATIVE
Nitrite: NEGATIVE
Protein, ur: NEGATIVE mg/dL
Specific Gravity, Urine: 1.023 (ref 1.005–1.030)
pH: 5 (ref 5.0–8.0)

## 2019-07-05 LAB — COMPREHENSIVE METABOLIC PANEL
ALT: 16 U/L (ref 0–44)
AST: 19 U/L (ref 15–41)
Albumin: 3.5 g/dL (ref 3.5–5.0)
Alkaline Phosphatase: 62 U/L (ref 38–126)
Anion gap: 11 (ref 5–15)
BUN: 12 mg/dL (ref 6–20)
CO2: 29 mmol/L (ref 22–32)
Calcium: 8.5 mg/dL — ABNORMAL LOW (ref 8.9–10.3)
Chloride: 100 mmol/L (ref 98–111)
Creatinine, Ser: 0.69 mg/dL (ref 0.44–1.00)
GFR calc Af Amer: >60 mL/min (ref >60)
GFR calc non Af Amer: >60 mL/min (ref >60)
Glucose, Bld: 133 mg/dL — ABNORMAL HIGH (ref 70–99)
Potassium: 4 mmol/L (ref 3.5–5.1)
Sodium: 140 mmol/L (ref 135–145)
Total Bilirubin: 0.5 mg/dL (ref 0.3–1.2)
Total Protein: 6.5 g/dL (ref 6.5–8.1)

## 2019-07-05 LAB — CBG MONITORING, ED: Glucose-Capillary: 162 mg/dL — ABNORMAL HIGH (ref 70–99)

## 2019-07-05 MED ORDER — FUROSEMIDE 20 MG PO TABS: 40.0000 mg | ORAL_TABLET | Freq: Two times a day (BID) | ORAL | 0 refills | Status: DC

## 2019-07-05 MED ORDER — IOHEXOL 300 MG/ML  SOLN
125.0000 mL | Freq: Once | INTRAMUSCULAR | Status: AC | PRN
  Administered 2019-07-05: 125 mL via INTRAVENOUS

## 2019-07-05 MED ORDER — FUROSEMIDE 10 MG/ML IJ SOLN
40.0000 mg | Freq: Once | INTRAMUSCULAR | Status: AC
  Administered 2019-07-05: 40 mg via INTRAVENOUS
  Filled 2019-07-05: qty 4

## 2019-07-05 NOTE — ED Provider Notes (Addendum)
[facility] EMERGENCY DEPARTMENT Provider Note   CSN: [id number] Arrival date & time: 07/05/19  1805     History Chief Complaint  Patient presents with  . Abdominal Pain    Michele Owens is a 59 y.o. female.  HPI      59 year old female presents today complaining of abdominal pain that began yesterday.  She describes it is on the right side there is now diffuse.  She has had some nausea and vomiting.  She reports no diarrhea.  She denies any urinary tract infection symptoms.  She states that she has been generally weak and also complains of feeling shaky has too much fluid on her legs.  She describes the pain as sharp and constant.  She has had similar pain in the past, but is unable to tell me what she was diagnosed with.  Past Medical History:  Diagnosis Date  . Anxiety   . Arthritis   . Asthma   . Chronic diastolic CHF (congestive heart failure) ([place])   . COPD (chronic obstructive pulmonary disease) (HCC)    Uses Oxygen at night  . Depression   . Diabetes mellitus without complication ([place])   . Dyspnea    occ  . Family history of thyroid cancer   . GERD (gastroesophageal reflux disease)   . Gout   . Headache    migraines  . History of DVT of lower extremity   . History of nuclear stress test    Myoview 2/17:  Low risk stress nuclear study with a small, moderate intensity, partially reversible inferior lateral defect consistent with small prior infarct and minimal peri-infarct ischemia; EF 68 with normal wall motion  . Hypertension   . Pneumonia   . Thyroid cancer ([place]) 09/23/2016    Patient Active Problem List   Diagnosis Date Noted  . Morbid obesity with BMI of 50.0-59.9, adult ([place])   . Fall at home, initial encounter 06/23/2018  . HNP (herniated nucleus pulposus), thoracic 06/23/2018  . CHF (congestive heart failure) ([place]) 06/18/2018  . Hypertensive disorder 08/28/2017  . Obesity 08/28/2017  . COPD exacerbation ([place]) 08/04/2017  . Acute  and chronic respiratory failure with hypercapnia ([place])   . Pulmonary HTN ([place])   . Genetic testing 02/27/2017  . Family history of thyroid cancer   . Hypomagnesemia 10/03/2016  . Atypical chest pain   . HLD (hyperlipidemia) 09/23/2016  . Gout 09/23/2016  . DVT (deep vein thrombosis) in pregnancy 09/23/2016  . Thyroid cancer ([place]) 09/23/2016  . Hypocalcemia 09/23/2016  . AKI (acute kidney injury) ([place]) 09/23/2016  . Acute pulmonary edema (HCC)   . Acute hypoxemic respiratory failure ([place])   . Ventilator dependent ([place])   . Respiratory failure ([place]) 07/16/2016  . CAP (community acquired pneumonia) 01/23/2016  . 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 Pulmonary hypertension ([place]) 01/23/2016  . Obesity hypoventilation syndrome ([place]) 08/26/2015  . Dyslipidemia associated with type 2 diabetes mellitus ([place]) 04/24/2015  . Leukocytosis 04/24/2015  . Controlled type 2 diabetes mellitus without complication, without long-term current use of insulin ([place]) 04/24/2015  . Anxiety 04/24/2015  . Benign essential HTN 04/24/2015  . Normocytic anemia 04/24/2015  . OSA (obstructive sleep apnea) 05/10/2012  . Tobacco abuse 07/04/2011    Past Surgical History:  Procedure Laterality Date  . BUNIONECTOMY Bilateral   . CARDIAC CATHETERIZATION N/A 06/23/2015   Procedure: Right/Left Heart Cath and Coronary Angiography;  Surgeon: [name], MD;  Location: [facility];  Service: Cardiovascular;  Laterality: N/A;  . COLONOSCOPY WITH PROPOFOL N/A 12/15/2015   Procedure: COLONOSCOPY WITH PROPOFOL;  Surgeon: [name], MD;  Location: [facility];  Service: Endoscopy;  Laterality: N/A;  . THYROIDECTOMY  09/19/2016  . THYROIDECTOMY N/A 09/19/2016   Procedure: TOTAL THYROIDECTOMY;  Surgeon: [name], MD;  Location: [name];  Service: General;  Laterality: N/A;  . TONSILLECTOMY    . TOTAL ABDOMINAL HYSTERECTOMY  07/14/10     OB History   No obstetric history on file.       Family History  Problem Relation Age of Onset  . Cancer Father        thought to be due to exposure to concrete  . Diabetes Mother   . Thyroid cancer Mother        dx in her 58s-60s  . Cancer Maternal Uncle        2 uncles with cancer NOS  . Brain cancer Paternal Aunt   . Cancer Cousin        maternal first cousin - NOS  . Cancer Cousin        maternal first cousin - NOS    Social History   Tobacco Use  . Smoking status: Former Smoker    Packs/day: 0.50    Years: 41.00    Pack years: 20.50    Types: Cigarettes    Quit date: 07/2016    Years since quitting: 2.9  . Smokeless tobacco: Never Used  . Tobacco comment: Starting to Wean Off -- using Nicotine Patch  Substance Use Topics  . Alcohol use: Not Currently    Alcohol/week: 0.0 standard drinks    Comment: ? none now  . Drug use: Never    Comment: none for long time    Home Medications Prior to Admission medications   Medication Sig Start Date End Date Taking? Authorizing Provider  albuterol (PROVENTIL HFA;VENTOLIN HFA) 108 (90 Base) MCG/ACT inhaler Inhale 1-2 puffs into the lungs every 6 (six) hours as needed for wheezing or shortness of breath. 04/23/18   Long, [name], MD  allopurinol (ZYLOPRIM) 100 MG tablet Take 100 mg by mouth daily.    [provider]  ANORO ELLIPTA 62.5-25 MCG/INH AEPB Inhale 1 puff into the lungs daily.  04/11/18   [provider]  Blood Glucose Monitoring Suppl (ACCU-CHEK AVIVA PLUS) w/Device KIT 1 each by Other route daily.  08/07/17   [provider]  butalbital-acetaminophen-caffeine (FIORICET, ESGIC) 50-325-40 MG tablet Take 1-2 tablets by mouth every 4 (four) hours as needed for migraine. 06/13/18   [provider]  calcitRIOL (ROCALTROL) 0.5 MCG capsule Take 1 capsule (0.5 mcg total) by mouth daily. 09/26/16   Hongalgi, [name], MD  carvedilol (COREG) 6.25 MG tablet Take 1 tablet (6.25 mg total) by mouth 2 (two) times daily with a meal. 01/23/18    Bensimhon, [name], MD  cetirizine (ZYRTEC) 10 MG tablet Take 10 mg by mouth daily. 07/10/17   [provider]  clonazePAM (KLONOPIN) 2 MG tablet Take 1 tablet (2 mg total) by mouth 3 (three) times daily. for anxiety 06/23/19   [name], MD  diclofenac sodium (VOLTAREN) 1 % GEL Apply 2 g topically 4 (four) times daily as needed (for pain). Patient not taking: Reported on 06/18/2019 06/19/18   [name], MD  EPINEPHrine 0.3 mg/0.3 mL IJ SOAJ injection Inject 0.3 mg into the muscle daily as needed (allergic reaction).    [provider]  FEROSUL 325 [phone]  Fe) MG tablet Take 325 mg by mouth daily. 03/22/17   [provider]  furosemide (LASIX) 20 MG tablet Take 2 tablets (40 mg total) by mouth 2 (two) times daily. Take 77m every morning and 438mevery evening Patient taking differently: Take 40 mg by mouth 2 (two) times daily. Take 4059mvery morning and 59m56mery evening 07/24/18   McLeLarey Dresser  glucose blood test strip Use as instructed to check blood sugar up to 4 times daily 08/06/17   ChoiDessa Phi  guaiFENesin (MUCINEX) 600 MG 12 hr tablet Take 2 tablets (1,200 mg total) by mouth 2 (two) times daily. 07/11/18   Xu, Florencia Reasons  HYDROcodone-acetaminophen (NORCO/VICODIN) 5-325 MG tablet Take 1 tablet by mouth every 6 (six) hours as needed for moderate pain.  02/12/18   [provider]  ipratropium-albuterol (DUONEB) 0.5-2.5 (3) MG/3ML SOLN Take 3 mLs by nebulization 4 (four) times daily.  04/05/17   [provider]  levothyroxine (SYNTHROID, LEVOTHROID) 112 MCG tablet Take 224 mcg by mouth daily. 07/11/17   [provider]  lidocaine (LIDODERM) 5 % Place 1 patch onto the skin daily. Remove & Discard patch within 12 hours or as directed by MD 07/11/18   Xu, Florencia Reasons  lisinopril (PRINIVIL,ZESTRIL) 5 MG tablet Take 1 tablet (5 mg total) by mouth daily. Please call for office visit [phone] 08/15/17   McLeLarey Dresser  loperamide  (IMODIUM) 2 MG capsule Take 1 capsule (2 mg total) by mouth 4 (four) times daily as needed for diarrhea or loose stools. 7/168/89/16licDelora Fuel  metFORMIN (GLUCOPHAGE) 500 MG tablet Take 1 tablet (500 mg total) by mouth 2 (two) times daily with a meal. 12/11/14   SullDelfina Redwood  methocarbamol (ROBAXIN) 500 MG tablet Take 1 tablet (500 mg total) by mouth every 8 (eight) hours as needed for muscle spasms. 06/19/18   SmitNorval Morton  metoCLOPramide (REGLAN) 10 MG tablet Take 1 tablet (10 mg total) by mouth every 6 (six) hours as needed for nausea (or headache). 7/169/45/03licDelora Fuel  metoprolol succinate (TOPROL-XL) 25 MG 24 hr tablet Take 25 mg by mouth in the morning.  08/09/18   [provider]  montelukast (SINGULAIR) 10 MG tablet Take 1 tablet (10 mg total) by mouth at bedtime. 07/11/18   Xu, Florencia Reasons  nitroGLYCERIN (NITROSTAT) 0.4 MG SL tablet Place 1 tablet (0.4 mg total) under the tongue every 5 (five) minutes as needed for chest pain. Patient not taking: Reported on 06/05/2019 06/19/18   SmitNorval Morton  nystatin cream (MYCOSTATIN) Apply 1 application topically 2 (two) times daily as needed for dry skin.  04/13/17   [provider]  omeprazole (PRILOSEC) 40 MG capsule Take 40 mg by mouth daily. 04/13/15   [provider]  OXYGEN Inhale 2 L into the lungs at bedtime.    [provider]  potassium chloride SA (K-DUR,KLOR-CON) 20 MEQ tablet Take 1 tablet (20 mEq total) by mouth 2 (two) times daily. 07/24/18   McLeLarey Dresser  pravastatin (PRAVACHOL) 40 MG tablet Take 40 mg by mouth every evening.  03/29/15   [provider]  predniSONE (DELTASONE) 20 MG tablet 3 tabs po day one, then 2 po daily x 4 days 06/26/19   Mesner, JasoCorene Cornea  QC STOOL SOFTENER PLS LAXATIVE 8.6-50 MG tablet Take 1 tablet by mouth at bedtime. 04/11/17   [provider]  tiotropium (SPIRIVA) 18 MCG inhalation capsule Place 18 mcg into inhaler and inhale  daily.    [provider]    Allergies    Bee venom, Fioricet [butalbital-apap-caffeine], Ibuprofen, Lamisil [terbinafine], and Nsaids  Review of Systems   Review of Systems  All other systems reviewed and are negative.   Physical Exam Updated Vital Signs BP (!) 102/53   Pulse 90   Temp 98.1 F (36.7 C) (Oral)   Ht 1.651 m ('5\' 5"' )   Wt 134 kg   SpO2 95%   BMI 49.16 kg/m   Physical Exam Vitals and nursing note reviewed.  Constitutional:      General: She is not in acute distress.    Appearance: She is well-developed. She is obese. She is not ill-appearing.  HENT:     Head: Normocephalic.     Mouth/Throat:     Mouth: Mucous membranes are moist.  Eyes:     Extraocular Movements: Extraocular movements intact.  Cardiovascular:     Rate and Rhythm: Normal rate.  Pulmonary:     Effort: Pulmonary effort is normal.     Breath sounds: Normal breath sounds.  Abdominal:     General: Bowel sounds are normal.     Palpations: Abdomen is soft.     Tenderness: There is generalized abdominal tenderness.     Hernia: No hernia is present.  Skin:    General: Skin is warm and dry.     Capillary Refill: Capillary refill takes less than 2 seconds.  Neurological:     Mental Status: She is alert.     ED Results / Procedures / Treatments   Labs (all labs ordered are listed, but only abnormal results are displayed) Labs Reviewed  CBC  COMPREHENSIVE METABOLIC PANEL  URINALYSIS, ROUTINE W REFLEX MICROSCOPIC    EKG EKG Interpretation  Date/Time:  Saturday July 05 2019 19:25:56 EDT Ventricular Rate:  86 PR Interval:    QRS Duration: 82 QT Interval:  377 QTC Calculation: 451 R Axis:   53 Text Interpretation: Sinus rhythm Low voltage, precordial leads Confirmed by Pattricia Boss [id number]) on 07/05/2019 9:01:57 PM   Radiology CT ABDOMEN PELVIS W CONTRAST  Result Date: 07/05/2019 CLINICAL DATA:  Nonlocalized abdominal pain, nausea, weakness EXAM: CT ABDOMEN AND PELVIS  WITH CONTRAST TECHNIQUE: Multidetector CT imaging of the abdomen and pelvis was performed using the standard protocol following bolus administration of intravenous contrast. CONTRAST:  11m OMNIPAQUE IOHEXOL 300 MG/ML  SOLN COMPARISON:  06/28/2019 FINDINGS: Lower chest: Hypoventilatory changes are noted. No acute pleural or parenchymal lung disease. Hepatobiliary: No focal liver abnormality is seen. No gallstones, gallbladder wall thickening, or biliary dilatation. Pancreas: Unremarkable. No pancreatic ductal dilatation or surrounding inflammatory changes. Spleen: Normal in size without focal abnormality. Adrenals/Urinary Tract: Adrenal glands are unremarkable. Kidneys are normal, without renal calculi, focal lesion, or hydronephrosis. Bladder is unremarkable. Stomach/Bowel: No bowel obstruction or ileus. Normal appendix right lower quadrant. No wall thickening or inflammatory changes within the bowel. Vascular/Lymphatic: No significant vascular findings are present. No enlarged abdominal or pelvic lymph nodes. Reproductive: Status post hysterectomy. No adnexal masses. Other: No abdominal wall hernia or abnormality. No abdominopelvic ascites. Musculoskeletal: No acute or destructive bony lesions. Reconstructed images demonstrate no additional findings. IMPRESSION: 1. Stable exam, no acute intra-abdominal or intrapelvic process. Electronically Signed   By: MRanda NgoM.D.   On: 07/05/2019 20:38   DG Chest Port 1 View  Result Date: 07/05/2019 CLINICAL DATA:  Shortness of breath, congestive heart failure. EXAM:  PORTABLE CHEST 1 VIEW COMPARISON:  Chest radiograph dated 06/25/2019 FINDINGS: The heart remains enlarged. Mild to moderate bibasilar atelectasis/airspace disease appears increased since 06/25/2019. Small pleural effusions may contribute. There is no pneumothorax. The osseous structures appear intact. IMPRESSION: Cardiomegaly. Mild to moderate bibasilar atelectasis/airspace disease. Small pleural  effusions may contribute. Electronically Signed   By: Zerita Boers M.D.   On: 07/05/2019 19:55    Procedures Procedures (including critical care time)  Medications Ordered in ED Medications - No data to display  ED Course  I have reviewed the triage vital signs and the nursing notes.  Pertinent labs & imaging results that were available during my care of the patient were reviewed by me and considered in my medical decision making (see chart for details).    MDM Rules/Calculators/A&P                     59 year old female presents today complaining of abdominal pain.  She has some diffuse tenderness on her exam.  White blood cell count 18,000, however, review reveals that it was 26,800 on March 20 and has been chronically elevated although has been down to 11,000, July 07, 2018 reviewing she has often presented with white blood cell counts in the upper teens to 20s.  Patient is not febrile.  CT of abdomen obtained due to diffuse tenderness and no active acute abnormalities are noted.  Patient also complained of some dyspnea.  Chest x-Quinterrius Errington obtained and shows increased markings with likely atelectasis and also some bilateral effusions.  Patient received IV Lasix here and has some had some diuretic effect with some decrease in her symptoms.  Plan outpatient treatment with Lasix-patient is on 60 mg per day and will increase to 80 mg,  and close follow-up with her cardiologist.   Final Clinical Impression(s) / ED Diagnoses Final diagnoses:  Generalized abdominal pain  Acute on chronic congestive heart failure, unspecified heart failure type [facility])    Rx / DC Orders ED Discharge Orders    None       Pattricia Boss, MD 07/05/19 2127    Pattricia Boss, MD 07/05/19 2127

## 2019-07-05 NOTE — ED Notes (Signed)
Urine culture sent down to lab  

## 2019-07-05 NOTE — ED Triage Notes (Signed)
Pt presents from home where she lives alone with EMS for SOB (with ambulation), abd pain, nausea, generalized weakness x1 day. Reports increased stress in life (cousin passed recently).   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  EMS exam: 95% on 2L [place] (wears normally at home), 90bpm, 130/100, CBG 162, fine rales lower R

## 2019-07-05 NOTE — Discharge Instructions (Signed)
Please increase lasix to 40 mg twice per day.  Call your cardiology office on Monday for recheck.

## 2019-07-05 NOTE — ED Notes (Signed)
Pt refused rectal temp.

## 2019-07-07 ENCOUNTER — Telehealth (HOSPITAL_COMMUNITY): Payer: Self-pay

## 2019-07-07 NOTE — Telephone Encounter (Signed)
Spoke to Ms. Golin who stated she didn't want to talk to me this week due to a death in her family. I will follow up next week.

## 2019-07-08 ENCOUNTER — Emergency Department (HOSPITAL_COMMUNITY)
Admission: EM | Admit: 2019-07-08 | Discharge: 2019-07-09 | Disposition: A | Discharge status: Home / Self Care | Payer: Medicaid Other | Attending: Emergency Medicine | Admitting: Emergency Medicine

## 2019-07-08 ENCOUNTER — Other Ambulatory Visit: Payer: Self-pay

## 2019-07-08 ENCOUNTER — Encounter (HOSPITAL_COMMUNITY): Payer: Self-pay | Admitting: Emergency Medicine

## 2019-07-08 DIAGNOSIS — E119 Type 2 diabetes mellitus without complications: Secondary | ICD-10-CM | POA: Diagnosis not present

## 2019-07-08 DIAGNOSIS — Z6841 Body Mass Index (BMI) 40.0 and over, adult: Secondary | ICD-10-CM | POA: Insufficient documentation

## 2019-07-08 DIAGNOSIS — E669 Obesity, unspecified: Secondary | ICD-10-CM | POA: Diagnosis not present

## 2019-07-08 DIAGNOSIS — Z85850 Personal history of malignant neoplasm of thyroid: Secondary | ICD-10-CM | POA: Insufficient documentation

## 2019-07-08 DIAGNOSIS — I5032 Chronic diastolic (congestive) heart failure: Secondary | ICD-10-CM | POA: Insufficient documentation

## 2019-07-08 DIAGNOSIS — R140 Abdominal distension (gaseous): Secondary | ICD-10-CM | POA: Diagnosis not present

## 2019-07-08 DIAGNOSIS — I10 Essential (primary) hypertension: Secondary | ICD-10-CM | POA: Diagnosis not present

## 2019-07-08 DIAGNOSIS — Z87891 Personal history of nicotine dependence: Secondary | ICD-10-CM | POA: Diagnosis not present

## 2019-07-08 DIAGNOSIS — Z7984 Long term (current) use of oral hypoglycemic drugs: Secondary | ICD-10-CM | POA: Diagnosis not present

## 2019-07-08 DIAGNOSIS — F419 Anxiety disorder, unspecified: Secondary | ICD-10-CM

## 2019-07-08 DIAGNOSIS — I110 Hypertensive heart disease with heart failure: Secondary | ICD-10-CM | POA: Diagnosis not present

## 2019-07-08 DIAGNOSIS — J449 Chronic obstructive pulmonary disease, unspecified: Secondary | ICD-10-CM | POA: Insufficient documentation

## 2019-07-08 DIAGNOSIS — R1084 Generalized abdominal pain: Secondary | ICD-10-CM | POA: Diagnosis present

## 2019-07-08 DIAGNOSIS — R419 Unspecified symptoms and signs involving cognitive functions and awareness: Secondary | ICD-10-CM | POA: Insufficient documentation

## 2019-07-08 LAB — URINALYSIS, ROUTINE W REFLEX MICROSCOPIC
Bilirubin Urine: NEGATIVE
Glucose, UA: NEGATIVE mg/dL
Hgb urine dipstick: NEGATIVE
Ketones, ur: NEGATIVE mg/dL
Leukocytes,Ua: NEGATIVE
Nitrite: NEGATIVE
Protein, ur: NEGATIVE mg/dL
Specific Gravity, Urine: 1.024 (ref 1.005–1.030)
pH: 5 (ref 5.0–8.0)

## 2019-07-08 LAB — COMPREHENSIVE METABOLIC PANEL
ALT: 17 U/L (ref 0–44)
AST: 16 U/L (ref 15–41)
Albumin: 3.8 g/dL (ref 3.5–5.0)
Alkaline Phosphatase: 67 U/L (ref 38–126)
Anion gap: 13 (ref 5–15)
BUN: 13 mg/dL (ref 6–20)
CO2: 32 mmol/L (ref 22–32)
Calcium: 9.1 mg/dL (ref 8.9–10.3)
Chloride: 96 mmol/L — ABNORMAL LOW (ref 98–111)
Creatinine, Ser: 0.57 mg/dL (ref 0.44–1.00)
GFR calc Af Amer: >60 mL/min (ref >60)
GFR calc non Af Amer: >60 mL/min (ref >60)
Glucose, Bld: 100 mg/dL — ABNORMAL HIGH (ref 70–99)
Potassium: 3.9 mmol/L (ref 3.5–5.1)
Sodium: 141 mmol/L (ref 135–145)
Total Bilirubin: 0.5 mg/dL (ref 0.3–1.2)
Total Protein: 7.3 g/dL (ref 6.5–8.1)

## 2019-07-08 LAB — LIPASE, BLOOD: Lipase: 22 U/L (ref 11–51)

## 2019-07-08 LAB — CBC
HCT: 39.6 % (ref 36.0–46.0)
Hemoglobin: 11.9 g/dL — ABNORMAL LOW (ref 12.0–15.0)
MCH: 28.6 pg (ref 26.0–34.0)
MCHC: 30.1 g/dL (ref 30.0–36.0)
MCV: 95.2 fL (ref 80.0–100.0)
Platelets: 283 10*3/uL (ref 150–400)
RBC: 4.16 MIL/uL (ref 3.87–5.11)
RDW: 13.9 % (ref 11.5–15.5)
WBC: 18.1 10*3/uL — ABNORMAL HIGH (ref 4.0–10.5)
nRBC: 0 % (ref 0.0–0.2)

## 2019-07-08 MED ORDER — OXYCODONE HCL ER 10 MG PO T12A
10.0000 mg | EXTENDED_RELEASE_TABLET | Freq: Two times a day (BID) | ORAL | Status: DC
  Administered 2019-07-08: 21:00:00 10 mg via ORAL
  Filled 2019-07-08: qty 1

## 2019-07-08 MED ORDER — HYDROXYZINE HCL 25 MG PO TABS
25.0000 mg | ORAL_TABLET | Freq: Once | ORAL | Status: AC
  Administered 2019-07-08: 23:00:00 25 mg via ORAL
  Filled 2019-07-08: qty 1

## 2019-07-08 MED ORDER — HYDROXYZINE HCL 25 MG PO TABS: 25.0000 mg | ORAL_TABLET | Freq: Four times a day (QID) | ORAL | 0 refills | Status: DC

## 2019-07-08 MED ORDER — ONDANSETRON HCL 4 MG/2ML IJ SOLN
4.0000 mg | Freq: Once | INTRAMUSCULAR | Status: AC
  Administered 2019-07-08: 4 mg via INTRAVENOUS
  Filled 2019-07-08: qty 2

## 2019-07-08 MED ORDER — FUROSEMIDE 10 MG/ML IJ SOLN
60.0000 mg | Freq: Once | INTRAMUSCULAR | Status: AC
  Administered 2019-07-08: 21:00:00 60 mg via INTRAVENOUS
  Filled 2019-07-08: qty 6

## 2019-07-08 NOTE — ED Notes (Signed)
Pt complaining about wait time.  Eating potato chips and drinking a soda.

## 2019-07-08 NOTE — ED Notes (Signed)
Ptar called for pt 

## 2019-07-08 NOTE — ED Provider Notes (Signed)
[facility] EMERGENCY DEPARTMENT Provider Note   CSN: [id number] Arrival date & time: 07/08/19  1319     History Chief Complaint  Patient presents with  . Abdominal Pain    Michele Owens is a 59 y.o. female presenting for evaluation of abd pain and distention.   Patient states she is having worsening abdominal pain and swelling.  She feels like she is fluid overloaded.  She also states she had a "episode "where she was having chest pain.  These have been happening frequently.  Patient states there is a lot going on in her life, she has had multiple family members die in the past several weeks.  She does not feel safe at home, as she states some of her neighbors are using/selling drugs.  She has been taking her Lasix as prescribed, has been taking the higher, 80 mg, dose of Lasix for the past day, but did not take her afternoon medicine today as she did not feel like she was able to get home to take it.  Instead, she came to the ED.  She has not followed up with cardiology about her heart failure.  She has an appointment with her primary doctor on April 8.  She denies fevers, chills, chest pain, shortness of breath, nausea, vomiting, urinary symptoms, abnormal bowel movements.  States she wears oxygen at baseline, has not needed to increase recently.  Additional history obtained from chart review.  This is the patient's fifth visit this month for similar symptoms.  She has had multiple CT scans of chest and abdomen this month.  Most recent CT scan was 3 days ago, showed no acute abnormalities.  Additional history of OSA, hyperlipidemia, diabetes, hypertension, anxiety, obesity, COPD.  HPI     Past Medical History:  Diagnosis Date  . Anxiety   . Arthritis   . Asthma   . Chronic diastolic CHF (congestive heart failure) ([place])   . COPD (chronic obstructive pulmonary disease) ([facility])    Uses Oxygen at night  . Depression   . Diabetes mellitus without complication ([place])   .  Dyspnea    occ  . Family history of thyroid cancer   . GERD (gastroesophageal reflux disease)   . Gout   . Headache    migraines  . History of DVT of lower extremity   . History of nuclear stress test    Myoview 2/17:  Low risk stress nuclear study with a small, moderate intensity, partially reversible inferior lateral defect consistent with small prior infarct and minimal peri-infarct ischemia; EF 68 with normal wall motion  . Hypertension   . Pneumonia   . Thyroid cancer ([place]) 09/23/2016    Patient Active Problem List   Diagnosis Date Noted  . Morbid obesity with BMI of 50.0-59.9, adult ([place])   . Fall at home, initial encounter 06/23/2018  . HNP (herniated nucleus pulposus), thoracic 06/23/2018  . CHF (congestive heart failure) ([place]) 06/18/2018  . Hypertensive disorder 08/28/2017  . Obesity 08/28/2017  . COPD exacerbation ([place]) 08/04/2017  . Acute and chronic respiratory failure with hypercapnia ([place])   . Pulmonary HTN ([place])   . Genetic testing 02/27/2017  . Family history of thyroid cancer   . Hypomagnesemia 10/03/2016  . Atypical chest pain   . HLD (hyperlipidemia) 09/23/2016  . Gout 09/23/2016  . DVT (deep vein thrombosis) in pregnancy 09/23/2016  . Thyroid cancer ([place]) 09/23/2016  . Hypocalcemia 09/23/2016  . AKI (acute kidney injury) ([place]) 09/23/2016  . Acute pulmonary  edema ([place])   . Acute hypoxemic respiratory failure ([place])   . Ventilator dependent ([place])   . Respiratory failure ([place]) 07/16/2016  . CAP (community acquired pneumonia) 01/23/2016  . 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 Pulmonary hypertension ([place]) 01/23/2016  . Obesity hypoventilation syndrome ([place]) 08/26/2015  . Dyslipidemia associated with type 2 diabetes mellitus ([place]) 04/24/2015  . Leukocytosis 04/24/2015  . Controlled type 2 diabetes mellitus without complication, without long-term current use of insulin ([place]) 04/24/2015  . Anxiety 04/24/2015  . Benign essential  HTN 04/24/2015  . Normocytic anemia 04/24/2015  . OSA (obstructive sleep apnea) 05/10/2012  . Tobacco abuse 07/04/2011    Past Surgical History:  Procedure Laterality Date  . BUNIONECTOMY Bilateral   . CARDIAC CATHETERIZATION N/A 06/23/2015   Procedure: Right/Left Heart Cath and Coronary Angiography;  Surgeon: [name], MD;  Location: [facility];  Service: Cardiovascular;  Laterality: N/A;  . COLONOSCOPY WITH PROPOFOL N/A 12/15/2015   Procedure: COLONOSCOPY WITH PROPOFOL;  Surgeon: [name], MD;  Location: [facility];  Service: Endoscopy;  Laterality: N/A;  . THYROIDECTOMY  09/19/2016  . THYROIDECTOMY N/A 09/19/2016   Procedure: TOTAL THYROIDECTOMY;  Surgeon: [name], MD;  Location: [facility];  Service: General;  Laterality: N/A;  . TONSILLECTOMY    . TOTAL ABDOMINAL HYSTERECTOMY  07/14/10     OB History   No obstetric history on file.     Family History  Problem Relation Age of Onset  . Cancer Father        thought to be due to exposure to concrete  . Diabetes Mother   . Thyroid cancer Mother        dx in her 40s-60s  . Cancer Maternal Uncle        2 uncles with cancer NOS  . Brain cancer Paternal Aunt   . Cancer Cousin        maternal first cousin - NOS  . Cancer Cousin        maternal first cousin - NOS    Social History   Tobacco Use  . Smoking status: Former Smoker    Packs/day: 0.50    Years: 41.00    Pack years: 20.50    Types: Cigarettes    Quit date: 07/2016    Years since quitting: 2.9  . Smokeless tobacco: Never Used  . Tobacco comment: Starting to Wean Off -- using Nicotine Patch  Substance Use Topics  . Alcohol use: Not Currently    Alcohol/week: 0.0 standard drinks    Comment: ? none now  . Drug use: Never    Comment: none for long time    Home Medications Prior to Admission medications   Medication Sig Start Date End Date Taking? Authorizing Provider  albuterol (PROVENTIL HFA;VENTOLIN HFA) 108 (90 Base) MCG/ACT inhaler  Inhale 1-2 puffs into the lungs every 6 (six) hours as needed for wheezing or shortness of breath. 04/23/18  Yes Long, [name], MD  allopurinol (ZYLOPRIM) 100 MG tablet Take 100 mg by mouth daily.   Yes [provider]  butalbital-acetaminophen-caffeine (FIORICET, ESGIC) 50-325-40 MG tablet Take 1-2 tablets by mouth every 4 (four) hours as needed for migraine. 06/13/18  Yes [provider]  carvedilol (COREG) 6.25 MG tablet Take 1 tablet (6.25 mg total) by mouth 2 (two) times daily with a meal. 01/23/18  Yes Bensimhon, [name], MD  clonazePAM (KLONOPIN) 2 MG tablet Take 1 tablet (2 mg total) by mouth 3 (three)  times daily. for anxiety 06/23/19  Yes [name], MD  FEROSUL 325 (65 Fe) MG tablet Take 325 mg by mouth daily. 03/22/17  Yes [provider]  furosemide (LASIX) 20 MG tablet Take 2 tablets (40 mg total) by mouth 2 (two) times daily. Take 22m every morning and 416mevery evening 07/05/19  Yes Ray, DaAndee PolesMD  ipratropium-albuterol (DUONEB) 0.5-2.5 (3) MG/3ML SOLN Take 3 mLs by nebulization 4 (four) times daily.  04/05/17  Yes [provider]  levothyroxine (SYNTHROID, LEVOTHROID) 112 MCG tablet Take 224 mcg by mouth daily. 07/11/17  Yes [provider]  metFORMIN (GLUCOPHAGE) 500 MG tablet Take 1 tablet (500 mg total) by mouth 2 (two) times daily with a meal. 12/11/14  Yes SuDelfina RedwoodMD  omeprazole (PRILOSEC) 40 MG capsule Take 40 mg by mouth daily. 04/13/15  Yes [provider]  potassium chloride SA (K-DUR,KLOR-CON) 20 MEQ tablet Take 1 tablet (20 mEq total) by mouth 2 (two) times daily. 07/24/18  Yes McLarey DresserMD  ANORO ELLIPTA 62.5-25 MCG/INH AEPB Inhale 1 puff into the lungs daily.  04/11/18   [provider]  Blood Glucose Monitoring Suppl (ACCU-CHEK AVIVA PLUS) w/Device KIT 1 each by Other route daily.  08/07/17   [provider]  calcitRIOL (ROCALTROL) 0.5 MCG capsule Take 1 capsule (0.5 mcg total) by  mouth daily. 09/26/16   Hongalgi, AnLenis DickinsonMD  cetirizine (ZYRTEC) 10 MG tablet Take 10 mg by mouth daily. 07/10/17   [provider]  diclofenac sodium (VOLTAREN) 1 % GEL Apply 2 g topically 4 (four) times daily as needed (for pain). Patient not taking: Reported on 06/18/2019 06/19/18   SmFuller Plan, MD  EPINEPHrine 0.3 mg/0.3 mL IJ SOAJ injection Inject 0.3 mg into the muscle daily as needed (allergic reaction).    [provider]  glucose blood test strip Use as instructed to check blood sugar up to 4 times daily Patient taking differently: 1 each by Other route See admin instructions. Use as instructed to check blood sugar up to 4 times daily 08/06/17   ChDessa PhiDO  guaiFENesin (MUCINEX) 600 MG 12 hr tablet Take 2 tablets (1,200 mg total) by mouth 2 (two) times daily. 07/11/18   XuFlorencia ReasonsMD  HYDROcodone-acetaminophen (NORCO/VICODIN) 5-325 MG tablet Take 1 tablet by mouth every 6 (six) hours as needed for moderate pain.  02/12/18   [provider]  hydrOXYzine (ATARAX/VISTARIL) 25 MG tablet Take 1 tablet (25 mg total) by mouth every 6 (six) hours. 07/08/19   Tajae Rybicki, PA-C  lidocaine (LIDODERM) 5 % Place 1 patch onto the skin daily. Remove & Discard patch within 12 hours or as directed by MD 07/11/18   XuFlorencia ReasonsMD  lisinopril (PRINIVIL,ZESTRIL) 5 MG tablet Take 1 tablet (5 mg total) by mouth daily. Please call for office visit [phone] 08/15/17   McLarey DresserMD  loperamide (IMODIUM) 2 MG capsule Take 1 capsule (2 mg total) by mouth 4 (four) times daily as needed for diarrhea or loose stools. 10/12/23/95 GlDelora FuelMD  methocarbamol (ROBAXIN) 500 MG tablet Take 1 tablet (500 mg total) by mouth every 8 (eight) hours as needed for muscle spasms. 06/19/18   SmNorval MortonMD  metoCLOPramide (REGLAN) 10 MG tablet Take 1 tablet (10 mg total) by mouth every 6 (six) hours as needed for nausea (or headache). 10/13/36/75 GlDelora FuelMD  metoprolol succinate  (TOPROL-XL) 25 MG 24 hr tablet Take  25 mg by mouth in the morning.  08/09/18   [provider]  montelukast (SINGULAIR) 10 MG tablet Take 1 tablet (10 mg total) by mouth at bedtime. 07/11/18   Florencia Reasons, MD  nitroGLYCERIN (NITROSTAT) 0.4 MG SL tablet Place 1 tablet (0.4 mg total) under the tongue every 5 (five) minutes as needed for chest pain. Patient not taking: Reported on 06/05/2019 06/19/18   [name], MD  nystatin cream (MYCOSTATIN) Apply 1 application topically 2 (two) times daily as needed for dry skin.  04/13/17   [provider]  OXYGEN Inhale 2 L into the lungs at bedtime.    [provider]  pravastatin (PRAVACHOL) 40 MG tablet Take 40 mg by mouth every evening.  03/29/15   [provider]  predniSONE (DELTASONE) 20 MG tablet 3 tabs po day one, then 2 po daily x 4 days Patient taking differently: Take 20 mg by mouth See admin instructions. 3 tabs po day one, then 2 po daily x 4 days 06/26/19   Mesner, Corene Cornea, MD  QC STOOL SOFTENER PLS LAXATIVE 8.6-50 MG tablet Take 1 tablet by mouth at bedtime. 04/11/17   [provider]  tiotropium (SPIRIVA) 18 MCG inhalation capsule Place 18 mcg into inhaler and inhale daily.    [provider]    Allergies    Bee venom, Fioricet [butalbital-apap-caffeine], Ibuprofen, Lamisil [terbinafine], and Nsaids  Review of Systems   Review of Systems  Gastrointestinal: Positive for abdominal distention and abdominal pain.  Psychiatric/Behavioral: The patient is nervous/anxious.   All other systems reviewed and are negative.   Physical Exam Updated Vital Signs BP 116/66   Pulse 83   Temp 98.2 F (36.8 C) (Oral)   Resp 20   Ht '5\' 5"'  (1.651 m)   Wt 134 kg   SpO2 95%   BMI 49.16 kg/m   Physical Exam Vitals and nursing note reviewed.  Constitutional:      General: She is not in acute distress.    Appearance: She is well-developed. She is obese.     Comments: Obese female resting comfortably in  the bed in no acute distress  HENT:     Head: Normocephalic and atraumatic.  Eyes:     Conjunctiva/sclera: Conjunctivae normal.     Pupils: Pupils are equal, round, and reactive to light.  Cardiovascular:     Rate and Rhythm: Normal rate and regular rhythm.     Pulses: Normal pulses.  Pulmonary:     Effort: Pulmonary effort is normal. No respiratory distress.     Breath sounds: Normal breath sounds. No wheezing.     Comments: On baseline O2.  Sats stable.  No signs of respiratory distress or accessory muscle use.  No obvious rales or rhonchi, although exam limited due to body habitus. Abdominal:     Palpations: Abdomen is soft.     Tenderness: There is abdominal tenderness.     Comments: Obese abdomen.  Patient reports tenderness palpation diffusely across abdomen, although no change in expression or objective signs of pain with palpation.  Musculoskeletal:        General: Normal range of motion.     Cervical back: Normal range of motion and neck supple.     Right lower leg: No edema.     Left lower leg: No edema.     Comments: No pitting edema.  Skin:    General: Skin is warm and dry.     Capillary Refill: Capillary refill takes less  than 2 seconds.  Neurological:     Mental Status: She is alert and oriented to person, place, and time.  Psychiatric:        Mood and Affect: Mood is anxious. Affect is tearful.     Comments: Patient spent an extensive time of the history discussing recent life events including death of multiple family members, and her concerns about living by herself.  She quickly becomes tearful.     ED Results / Procedures / Treatments   Labs (all labs ordered are listed, but only abnormal results are displayed) Labs Reviewed  COMPREHENSIVE METABOLIC PANEL - Abnormal; Notable for the following components:      Result Value   Chloride 96 (*)    Glucose, Bld 100 (*)    All other components within normal limits  CBC - Abnormal; Notable for the following  components:   WBC 18.1 (*)    Hemoglobin 11.9 (*)    All other components within normal limits  LIPASE, BLOOD  URINALYSIS, ROUTINE W REFLEX MICROSCOPIC    EKG None  Radiology No results found.  Procedures Procedures (including critical care time)  Medications Ordered in ED Medications  oxyCODONE (OXYCONTIN) 12 hr tablet 10 mg (10 mg Oral Given 07/08/19 2129)  furosemide (LASIX) injection 60 mg (60 mg Intravenous Given 07/08/19 2117)  ondansetron (ZOFRAN) injection 4 mg (4 mg Intravenous Given 07/08/19 2250)  hydrOXYzine (ATARAX/VISTARIL) tablet 25 mg (25 mg Oral Given 07/08/19 2319)    ED Course  I have reviewed the triage vital signs and the nursing notes.  Pertinent labs & imaging results that were available during my care of the patient were reviewed by me and considered in my medical decision making (see chart for details).    MDM Rules/Calculators/A&P                      Patient presenting for evaluation of abdominal pain and distention.  On exam, patient appears nontoxic.  She does have a diffusely tender abdomen.  No pitting edema or signs of respiratory distress.  Sats stable on her baseline oxygen.  EKG obtained from triage shows NSR, similar to previous.  Labs obtained from triage shows white count of 18, however on chart review, this is close to patient's baseline.  WBC is normally upper teens/20s.  As she is afebrile, and symptoms are similar to how she presented at her last ED visit, I do not believe she needs repeat CT scan today.  Her abdominal distention and pain is likely due to fluid buildup.  Will give Lasix and reassess.  On reassessment, patient reports improvement of symptoms.  She is requesting something for anxiety, will give hydroxyzine.  Discussed with patient my concerns that her at home Lasix dose may need to be adjusted.  Encourage patient to follow-up with the heart failure clinic.  Discussed my concerns about her anxiety, and importance of close  follow-up with primary care.  At this time, patient appears safe for discharge.  Return precautions given.  Patient states she understands and agrees to plan.  Final Clinical Impression(s) / ED Diagnoses Final diagnoses:  Abdominal distension  Anxiety    Rx / DC Orders ED Discharge Orders         Ordered    AMB referral to CHF clinic     07/08/19 2322    hydrOXYzine (ATARAX/VISTARIL) 25 MG tablet  Every 6 hours     07/08/19 2323  Franchot Heidelberg, PA-C 07/08/19 2345    Noemi Chapel, MD 07/09/19 [phone]

## 2019-07-08 NOTE — ED Triage Notes (Signed)
Pt arrives via EMS with reports of abd pain starting this am that worsened this afternoon. Endorses swelling due to her CHF. Home O2 2L.

## 2019-07-08 NOTE — Discharge Instructions (Signed)
Continue taking all medications as prescribed. Continue taking 80 mg of Lasix daily.  It is extremely important that you follow closely with your cardiologist.  If you do not hear from the heart failure clinic in the next day or 2, call to set up an appointment. Take hydroxyzine as needed for anxiety.  Follow-up with your primary care doctor at your scheduled appointment. Return to the emergency room with any new, worsening, or concerning symptoms.

## 2019-07-09 NOTE — ED Notes (Signed)
Pt continues to wait on PTAR. Pt requested update. RN informed pt that she was on the list to be picked up. There is at least one more person at this hospital waiting for transport. Comfort measures provided including drink and crackers.

## 2019-07-09 NOTE — ED Notes (Signed)
Ptar cancelled per NT [name], pt found a ride home

## 2019-07-09 NOTE — ED Notes (Signed)
Discussed discharge instructions with pt. Pt verbalized understanding with no questions at this time. Pt unable to obtain ride home. Secretary to call [facility] for transport home.   Pt informed she will be moved to progressive bed, meaning she will have to wait on PTAR in a hallway bed.

## 2019-07-09 NOTE — ED Notes (Signed)
Pt complaining about waiting for PTAR and having to wait in the hallway. This NT informed the pt that we are waiting for them to pick her up that she has to wait for them to arrive. Pt took unknown medication out of bag and stated she is taking it for a migraine. Pt continues to make comments towards this NT about having to wait and being treated poorly. This NT explained that we have done all that we can for her care and she has been discharged and waiting for transport.

## 2019-07-09 NOTE — ED Notes (Signed)
Pt. States she has called ride to go home d/t delay on PT[facility]. RN informed pt she could be wheelchair out to lobby when ride arrived. Pt refused. Stated RN "you are going to bring me a wheelchair now." NT wheelchair pt to lobby to wait on ride.

## 2019-07-09 NOTE — ED Notes (Addendum)
Pt comfortable and sleeping.

## 2019-07-09 NOTE — ED Notes (Signed)
Rounded on pt. Pt in bed resting. Pt oxygen tank changed.

## 2019-07-11 ENCOUNTER — Other Ambulatory Visit: Payer: Self-pay

## 2019-07-11 ENCOUNTER — Encounter (HOSPITAL_COMMUNITY): Payer: Self-pay

## 2019-07-11 ENCOUNTER — Emergency Department (HOSPITAL_COMMUNITY)
Admission: EM | Admit: 2019-07-11 | Discharge: 2019-07-11 | Disposition: A | Discharge status: Home / Self Care | Payer: Medicaid Other | Attending: Emergency Medicine | Admitting: Emergency Medicine

## 2019-07-11 DIAGNOSIS — E119 Type 2 diabetes mellitus without complications: Secondary | ICD-10-CM | POA: Insufficient documentation

## 2019-07-11 DIAGNOSIS — J449 Chronic obstructive pulmonary disease, unspecified: Secondary | ICD-10-CM | POA: Diagnosis not present

## 2019-07-11 DIAGNOSIS — Z79899 Other long term (current) drug therapy: Secondary | ICD-10-CM | POA: Diagnosis not present

## 2019-07-11 DIAGNOSIS — R1010 Upper abdominal pain, unspecified: Secondary | ICD-10-CM | POA: Diagnosis present

## 2019-07-11 DIAGNOSIS — I110 Hypertensive heart disease with heart failure: Secondary | ICD-10-CM | POA: Diagnosis not present

## 2019-07-11 DIAGNOSIS — Z87891 Personal history of nicotine dependence: Secondary | ICD-10-CM | POA: Diagnosis not present

## 2019-07-11 DIAGNOSIS — Z85850 Personal history of malignant neoplasm of thyroid: Secondary | ICD-10-CM | POA: Insufficient documentation

## 2019-07-11 DIAGNOSIS — I5032 Chronic diastolic (congestive) heart failure: Secondary | ICD-10-CM | POA: Insufficient documentation

## 2019-07-11 DIAGNOSIS — R109 Unspecified abdominal pain: Secondary | ICD-10-CM

## 2019-07-11 DIAGNOSIS — Z7984 Long term (current) use of oral hypoglycemic drugs: Secondary | ICD-10-CM | POA: Diagnosis not present

## 2019-07-11 LAB — COMPREHENSIVE METABOLIC PANEL
ALT: 17 U/L (ref 0–44)
AST: 19 U/L (ref 15–41)
Albumin: 4.2 g/dL (ref 3.5–5.0)
Alkaline Phosphatase: 71 U/L (ref 38–126)
Anion gap: 9 (ref 5–15)
BUN: 9 mg/dL (ref 6–20)
CO2: 32 mmol/L (ref 22–32)
Calcium: 9.1 mg/dL (ref 8.9–10.3)
Chloride: 101 mmol/L (ref 98–111)
Creatinine, Ser: 0.55 mg/dL (ref 0.44–1.00)
GFR calc Af Amer: >60 mL/min (ref >60)
GFR calc non Af Amer: >60 mL/min (ref >60)
Glucose, Bld: 124 mg/dL — ABNORMAL HIGH (ref 70–99)
Potassium: 3.9 mmol/L (ref 3.5–5.1)
Sodium: 142 mmol/L (ref 135–145)
Total Bilirubin: 0.4 mg/dL (ref 0.3–1.2)
Total Protein: 7.6 g/dL (ref 6.5–8.1)

## 2019-07-11 LAB — CBC
HCT: 39.3 % (ref 36.0–46.0)
Hemoglobin: 12.1 g/dL (ref 12.0–15.0)
MCH: 29.1 pg (ref 26.0–34.0)
MCHC: 30.8 g/dL (ref 30.0–36.0)
MCV: 94.5 fL (ref 80.0–100.0)
Platelets: 309 10*3/uL (ref 150–400)
RBC: 4.16 MIL/uL (ref 3.87–5.11)
RDW: 14.2 % (ref 11.5–15.5)
WBC: 16.3 10*3/uL — ABNORMAL HIGH (ref 4.0–10.5)
nRBC: 0 % (ref 0.0–0.2)

## 2019-07-11 LAB — LIPASE, BLOOD: Lipase: 29 U/L (ref 11–51)

## 2019-07-11 MED ORDER — ONDANSETRON 4 MG PO TBDP: 4.0000 mg | ORAL_TABLET | Freq: Once | ORAL | Status: DC | PRN

## 2019-07-11 MED ORDER — LORAZEPAM 1 MG PO TABS
1.0000 mg | ORAL_TABLET | Freq: Once | ORAL | Status: AC
  Administered 2019-07-11: 1 mg via ORAL
  Filled 2019-07-11: qty 1

## 2019-07-11 MED ORDER — DICYCLOMINE HCL 20 MG PO TABS: 20.0000 mg | ORAL_TABLET | Freq: Four times a day (QID) | ORAL | 0 refills | Status: DC | PRN

## 2019-07-11 MED ORDER — DICYCLOMINE HCL 10 MG PO CAPS
10.0000 mg | ORAL_CAPSULE | Freq: Once | ORAL | Status: AC
  Administered 2019-07-11: 10 mg via ORAL
  Filled 2019-07-11: qty 1

## 2019-07-11 MED ORDER — SODIUM CHLORIDE 0.9% FLUSH: 3.0000 mL | Freq: Once | INTRAVENOUS | Status: DC

## 2019-07-11 MED ORDER — FAMOTIDINE 20 MG PO TABS
20.0000 mg | ORAL_TABLET | Freq: Once | ORAL | Status: AC
  Administered 2019-07-11: 20 mg via ORAL
  Filled 2019-07-11: qty 1

## 2019-07-11 NOTE — ED Triage Notes (Signed)
Patient arrived by [facility] from home. Patient is complaining of abdominal pain all over. Patient has had nausea and vomiting x 2 weeks. Patient states she has not eating much. Patient started hydroxyzine two days ago.

## 2019-07-11 NOTE — ED Notes (Signed)
Patient refused to sign for discharge. Pt yelling at and accusing staff for being racist.

## 2019-07-14 ENCOUNTER — Telehealth (HOSPITAL_COMMUNITY): Payer: Self-pay

## 2019-07-14 NOTE — ED Provider Notes (Signed)
[facility] DEPT Provider Note   CSN: [id number] Arrival date & time: 07/11/19  1937     History Chief Complaint  Patient presents with  . Abdominal Pain    Michele Owens is a 59 y.o. female.  HPI   59 year old female with abdominal pain.  She has been seen several times recently for similar sounding complaints.  Pain in upper abdomen.  She has numerous other complaints.  She is concerned about the place she lives.  She feels that people are selling drugs and generally that is have a safe place to be.  She also expresses frustration over problems with her family.  No fevers or chills.  No acute urinary complaints.  Past Medical History:  Diagnosis Date  . Anxiety   . Arthritis   . Asthma   . Chronic diastolic CHF (congestive heart failure) ([place])   . COPD (chronic obstructive pulmonary disease) (HCC)    Uses Oxygen at night  . Depression   . Diabetes mellitus without complication ([place])   . Dyspnea    occ  . Family history of thyroid cancer   . GERD (gastroesophageal reflux disease)   . Gout   . Headache    migraines  . History of DVT of lower extremity   . History of nuclear stress test    Myoview 2/17:  Low risk stress nuclear study with a small, moderate intensity, partially reversible inferior lateral defect consistent with small prior infarct and minimal peri-infarct ischemia; EF 68 with normal wall motion  . Hypertension   . Pneumonia   . Thyroid cancer ([place]) 09/23/2016    Patient Active Problem List   Diagnosis Date Noted  . Morbid obesity with BMI of 50.0-59.9, adult ([place])   . Fall at home, initial encounter 06/23/2018  . HNP (herniated nucleus pulposus), thoracic 06/23/2018  . CHF (congestive heart failure) ([place]) 06/18/2018  . Hypertensive disorder 08/28/2017  . Obesity 08/28/2017  . COPD exacerbation ([place]) 08/04/2017  . Acute and chronic respiratory failure with hypercapnia ([place])   . Pulmonary HTN ([place])   . Genetic testing  02/27/2017  . Family history of thyroid cancer   . Hypomagnesemia 10/03/2016  . Atypical chest pain   . HLD (hyperlipidemia) 09/23/2016  . Gout 09/23/2016  . DVT (deep vein thrombosis) in pregnancy 09/23/2016  . Thyroid cancer ([place]) 09/23/2016  . Hypocalcemia 09/23/2016  . AKI (acute kidney injury) ([place]) 09/23/2016  . Acute pulmonary edema (HCC)   . Acute hypoxemic respiratory failure ([place])   . Ventilator dependent ([place])   . Respiratory failure ([place]) 07/16/2016  . CAP (community acquired pneumonia) 01/23/2016  . 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 Pulmonary hypertension ([place]) 01/23/2016  . Obesity hypoventilation syndrome ([place]) 08/26/2015  . Dyslipidemia associated with type 2 diabetes mellitus ([place]) 04/24/2015  . Leukocytosis 04/24/2015  . Controlled type 2 diabetes mellitus without complication, without long-term current use of insulin ([place]) 04/24/2015  . Anxiety 04/24/2015  . Benign essential HTN 04/24/2015  . Normocytic anemia 04/24/2015  . OSA (obstructive sleep apnea) 05/10/2012  . Tobacco abuse 07/04/2011    Past Surgical History:  Procedure Laterality Date  . BUNIONECTOMY Bilateral   . CARDIAC CATHETERIZATION N/A 06/23/2015   Procedure: Right/Left Heart Cath and Coronary Angiography;  Surgeon: [name], MD;  Location: [facility];  Service: Cardiovascular;  Laterality: N/A;  . COLONOSCOPY WITH PROPOFOL N/A 12/15/2015   Procedure: COLONOSCOPY WITH PROPOFOL;  Surgeon: [name], MD;  Location:  [facility];  Service: Endoscopy;  Laterality: N/A;  . THYROIDECTOMY  09/19/2016  . THYROIDECTOMY N/A 09/19/2016   Procedure: TOTAL THYROIDECTOMY;  Surgeon: [name], MD;  Location: [facility];  Service: General;  Laterality: N/A;  . TONSILLECTOMY    . TOTAL ABDOMINAL HYSTERECTOMY  07/14/10     OB History   No obstetric history on file.     Family History  Problem Relation Age of Onset  . Cancer Father        thought to be due to  exposure to concrete  . Diabetes Mother   . Thyroid cancer Mother        dx in her 64s-60s  . Cancer Maternal Uncle        2 uncles with cancer NOS  . Brain cancer Paternal Aunt   . Cancer Cousin        maternal first cousin - NOS  . Cancer Cousin        maternal first cousin - NOS    Social History   Tobacco Use  . Smoking status: Former Smoker    Packs/day: 0.50    Years: 41.00    Pack years: 20.50    Types: Cigarettes    Quit date: 07/2016    Years since quitting: 3.0  . Smokeless tobacco: Never Used  . Tobacco comment: Starting to Wean Off -- using Nicotine Patch  Substance Use Topics  . Alcohol use: Not Currently    Alcohol/week: 0.0 standard drinks    Comment: ? none now  . Drug use: Never    Comment: none for long time    Home Medications Prior to Admission medications   Medication Sig Start Date End Date Taking? Authorizing Provider  albuterol (PROVENTIL HFA;VENTOLIN HFA) 108 (90 Base) MCG/ACT inhaler Inhale 1-2 puffs into the lungs every 6 (six) hours as needed for wheezing or shortness of breath. 04/23/18   Long, [name], MD  allopurinol (ZYLOPRIM) 100 MG tablet Take 100 mg by mouth daily.    [provider]  ANORO ELLIPTA 62.5-25 MCG/INH AEPB Inhale 1 puff into the lungs daily.  04/11/18   [provider]  Blood Glucose Monitoring Suppl (ACCU-CHEK AVIVA PLUS) w/Device KIT 1 each by Other route daily.  08/07/17   [provider]  butalbital-acetaminophen-caffeine (FIORICET, ESGIC) 50-325-40 MG tablet Take 1-2 tablets by mouth every 4 (four) hours as needed for migraine. 06/13/18   [provider]  calcitRIOL (ROCALTROL) 0.5 MCG capsule Take 1 capsule (0.5 mcg total) by mouth daily. 09/26/16   Hongalgi, [name], MD  carvedilol (COREG) 6.25 MG tablet Take 1 tablet (6.25 mg total) by mouth 2 (two) times daily with a meal. 01/23/18   Bensimhon, [name], MD  cetirizine (ZYRTEC) 10 MG tablet Take 10 mg by mouth daily. 07/10/17   [provider]  clonazePAM (KLONOPIN) 2 MG tablet Take 1 tablet (2 mg total) by mouth 3 (three) times daily. for anxiety 06/23/19   [name], MD  diclofenac sodium (VOLTAREN) 1 % GEL Apply 2 g topically 4 (four) times daily as needed (for pain). Patient not taking: Reported on 06/18/2019 06/19/18   [name], MD  dicyclomine (BENTYL) 20 MG tablet Take 1 tablet (20 mg total) by mouth every 6 (six) hours as needed for spasms. 07/11/19   Virgel Manifold, MD  EPINEPHrine 0.3 mg/0.3 mL IJ SOAJ injection Inject 0.3 mg into the muscle daily as needed (allergic reaction).    [provider]  Caroleen Hamman  325 (65 Fe) MG tablet Take 325 mg by mouth daily. 03/22/17   [provider]  furosemide (LASIX) 20 MG tablet Take 2 tablets (40 mg total) by mouth 2 (two) times daily. Take 48m every morning and 425mevery evening 07/05/19   RaPattricia BossMD  glucose blood test strip Use as instructed to check blood sugar up to 4 times daily Patient taking differently: 1 each by Other route See admin instructions. Use as instructed to check blood sugar up to 4 times daily 08/06/17   ChDessa PhiDO  guaiFENesin (MUCINEX) 600 MG 12 hr tablet Take 2 tablets (1,200 mg total) by mouth 2 (two) times daily. 07/11/18   XuFlorencia ReasonsMD  HYDROcodone-acetaminophen (NORCO/VICODIN) 5-325 MG tablet Take 1 tablet by mouth every 6 (six) hours as needed for moderate pain.  02/12/18   [provider]  hydrOXYzine (ATARAX/VISTARIL) 25 MG tablet Take 1 tablet (25 mg total) by mouth every 6 (six) hours. 07/08/19   Caccavale, Sophia, PA-C  ipratropium-albuterol (DUONEB) 0.5-2.5 (3) MG/3ML SOLN Take 3 mLs by nebulization 4 (four) times daily.  04/05/17   [provider]  levothyroxine (SYNTHROID, LEVOTHROID) 112 MCG tablet Take 224 mcg by mouth daily. 07/11/17   [provider]  lidocaine (LIDODERM) 5 % Place 1 patch onto the skin daily. Remove & Discard patch within 12 hours or as directed by MD 07/11/18    XuFlorencia ReasonsMD  lisinopril (PRINIVIL,ZESTRIL) 5 MG tablet Take 1 tablet (5 mg total) by mouth daily. Please call for office visit [phone] 08/15/17   McLarey DresserMD  loperamide (IMODIUM) 2 MG capsule Take 1 capsule (2 mg total) by mouth 4 (four) times daily as needed for diarrhea or loose stools. 10/08/61/84 GlDelora FuelMD  metFORMIN (GLUCOPHAGE) 500 MG tablet Take 1 tablet (500 mg total) by mouth 2 (two) times daily with a meal. 12/11/14   SuDelfina RedwoodMD  methocarbamol (ROBAXIN) 500 MG tablet Take 1 tablet (500 mg total) by mouth every 8 (eight) hours as needed for muscle spasms. 06/19/18   SmNorval MortonMD  metoCLOPramide (REGLAN) 10 MG tablet Take 1 tablet (10 mg total) by mouth every 6 (six) hours as needed for nausea (or headache). 10/13/34/46 GlDelora FuelMD  metoprolol succinate (TOPROL-XL) 25 MG 24 hr tablet Take 25 mg by mouth in the morning.  08/09/18   [provider]  montelukast (SINGULAIR) 10 MG tablet Take 1 tablet (10 mg total) by mouth at bedtime. 07/11/18   XuFlorencia ReasonsMD  nitroGLYCERIN (NITROSTAT) 0.4 MG SL tablet Place 1 tablet (0.4 mg total) under the tongue every 5 (five) minutes as needed for chest pain. Patient not taking: Reported on 06/05/2019 06/19/18   SmNorval MortonMD  nystatin cream (MYCOSTATIN) Apply 1 application topically 2 (two) times daily as needed for dry skin.  04/13/17   [provider]  omeprazole (PRILOSEC) 40 MG capsule Take 40 mg by mouth daily. 04/13/15   [provider]  OXYGEN Inhale 2 L into the lungs at bedtime.    [provider]  potassium chloride SA (K-DUR,KLOR-CON) 20 MEQ tablet Take 1 tablet (20 mEq total) by mouth 2 (two) times daily. 07/24/18   McLarey DresserMD  pravastatin (PRAVACHOL) 40 MG tablet Take 40 mg by mouth every evening.  03/29/15   [provider]  predniSONE (DELTASONE) 20 MG tablet 3 tabs po day one, then 2 po daily x 4 days Patient  taking differently: Take 20 mg by mouth See  admin instructions. 3 tabs po day one, then 2 po daily x 4 days 06/26/19   Mesner, Corene Cornea, MD  QC STOOL SOFTENER PLS LAXATIVE 8.6-50 MG tablet Take 1 tablet by mouth at bedtime. 04/11/17   [provider]  tiotropium (SPIRIVA) 18 MCG inhalation capsule Place 18 mcg into inhaler and inhale daily.    [provider]    Allergies    Bee venom, Fioricet [butalbital-apap-caffeine], Ibuprofen, Lamisil [terbinafine], and Nsaids  Review of Systems   Review of Systems All systems reviewed and negative, other than as noted in HPI.  Physical Exam Updated Vital Signs BP 110/63   Pulse 89   Temp 97.7 F (36.5 C) (Oral)   Resp (!) 28   Ht '5\' 5"'  (1.651 m)   Wt 134 kg   SpO2 97%   BMI 49.16 kg/m   Physical Exam Vitals and nursing note reviewed.  Constitutional:      General: She is not in acute distress.    Appearance: She is well-developed. She is obese.  HENT:     Head: Normocephalic and atraumatic.  Eyes:     General:        Right eye: No discharge.        Left eye: No discharge.     Conjunctiva/sclera: Conjunctivae normal.  Cardiovascular:     Rate and Rhythm: Normal rate and regular rhythm.     Heart sounds: Normal heart sounds. No murmur. No friction rub. No gallop.   Pulmonary:     Effort: Pulmonary effort is normal. No respiratory distress.     Breath sounds: Normal breath sounds.  Abdominal:     General: There is no distension.     Palpations: Abdomen is soft.     Tenderness: There is no abdominal tenderness.  Musculoskeletal:        General: No tenderness.     Cervical back: Neck supple.  Skin:    General: Skin is warm and dry.  Neurological:     Mental Status: She is alert.  Psychiatric:        Behavior: Behavior normal.        Thought Content: Thought content normal.     ED Results / Procedures / Treatments   Labs (all labs ordered are listed, but only abnormal results are displayed) Labs Reviewed  COMPREHENSIVE METABOLIC PANEL - Abnormal;  Notable for the following components:      Result Value   Glucose, Bld 124 (*)    All other components within normal limits  CBC - Abnormal; Notable for the following components:   WBC 16.3 (*)    All other components within normal limits  LIPASE, BLOOD    EKG None  Radiology No results found.  Procedures Procedures (including critical care time)  Medications Ordered in ED Medications  dicyclomine (BENTYL) capsule 10 mg (10 mg Oral Given 07/11/19 2046)  famotidine (PEPCID) tablet 20 mg (20 mg Oral Given 07/11/19 2046)  LORazepam (ATIVAN) tablet 1 mg (1 mg Oral Given 07/11/19 2046)    ED Course  I have reviewed the triage vital signs and the nursing notes.  Pertinent labs & imaging results that were available during my care of the patient were reviewed by me and considered in my medical decision making (see chart for details).    MDM Rules/Calculators/A&P  59 year old female with numerous complaints.  I suspect that there is a significant component of stress/anxiety.  Seen recently for similar complaints.  These work-ups were reviewed.  Not feel that further testing is needed at this time.  Patient crying at times.  Very emotionally labile.  Try to reassure her.  Patient sister becoming very angry and accusing staff of being racist.  I have a low suspicion for emergent process.  Still tried to reassure.  Needs outpatient follow-up. Final Clinical Impression(s) / ED Diagnoses Final diagnoses:  Abdominal pain, unspecified abdominal location    Rx / DC Orders ED Discharge Orders         Ordered    dicyclomine (BENTYL) 20 MG tablet  Every 6 hours PRN     07/11/19 2140           Virgel Manifold, MD 07/14/19 0150

## 2019-07-14 NOTE — Telephone Encounter (Signed)
Spoke to Michele Owens who said she went to [facility] and was treated poorly and had to sit outside. She stated she is not feeling well and she is waiting for her medicines. She also reports she has began smoking cigarettes due to her nerves. She reports medicines given to her caused her to go back and fourth to the hospital. She agreed for a home visit next weds at 1000.

## 2019-07-31 ENCOUNTER — Ambulatory Visit (HOSPITAL_COMMUNITY)
Admission: RE | Admit: 2019-07-31 | Discharge: 2019-07-31 | Disposition: A | Discharge status: Home / Self Care | Payer: Medicaid Other | Source: Ambulatory Visit | Attending: Internal Medicine | Admitting: Internal Medicine

## 2019-07-31 ENCOUNTER — Other Ambulatory Visit: Payer: Self-pay

## 2019-07-31 ENCOUNTER — Encounter (HOSPITAL_COMMUNITY): Payer: Self-pay

## 2019-07-31 VITALS — BP 112/76 | HR 74 | Wt 311.0 lb

## 2019-07-31 DIAGNOSIS — I5032 Chronic diastolic (congestive) heart failure: Secondary | ICD-10-CM

## 2019-07-31 DIAGNOSIS — G4733 Obstructive sleep apnea (adult) (pediatric): Secondary | ICD-10-CM | POA: Insufficient documentation

## 2019-07-31 DIAGNOSIS — Z85850 Personal history of malignant neoplasm of thyroid: Secondary | ICD-10-CM | POA: Diagnosis not present

## 2019-07-31 DIAGNOSIS — Z833 Family history of diabetes mellitus: Secondary | ICD-10-CM | POA: Insufficient documentation

## 2019-07-31 DIAGNOSIS — Z6841 Body Mass Index (BMI) 40.0 and over, adult: Secondary | ICD-10-CM | POA: Insufficient documentation

## 2019-07-31 DIAGNOSIS — I110 Hypertensive heart disease with heart failure: Secondary | ICD-10-CM | POA: Insufficient documentation

## 2019-07-31 DIAGNOSIS — Z79899 Other long term (current) drug therapy: Secondary | ICD-10-CM | POA: Diagnosis not present

## 2019-07-31 DIAGNOSIS — Z87891 Personal history of nicotine dependence: Secondary | ICD-10-CM | POA: Insufficient documentation

## 2019-07-31 DIAGNOSIS — Z86718 Personal history of other venous thrombosis and embolism: Secondary | ICD-10-CM | POA: Insufficient documentation

## 2019-07-31 DIAGNOSIS — M109 Gout, unspecified: Secondary | ICD-10-CM | POA: Diagnosis not present

## 2019-07-31 DIAGNOSIS — Z808 Family history of malignant neoplasm of other organs or systems: Secondary | ICD-10-CM | POA: Insufficient documentation

## 2019-07-31 DIAGNOSIS — F419 Anxiety disorder, unspecified: Secondary | ICD-10-CM | POA: Insufficient documentation

## 2019-07-31 DIAGNOSIS — Z7984 Long term (current) use of oral hypoglycemic drugs: Secondary | ICD-10-CM | POA: Diagnosis not present

## 2019-07-31 DIAGNOSIS — E119 Type 2 diabetes mellitus without complications: Secondary | ICD-10-CM | POA: Insufficient documentation

## 2019-07-31 DIAGNOSIS — M1990 Unspecified osteoarthritis, unspecified site: Secondary | ICD-10-CM | POA: Insufficient documentation

## 2019-07-31 DIAGNOSIS — J449 Chronic obstructive pulmonary disease, unspecified: Secondary | ICD-10-CM | POA: Diagnosis not present

## 2019-07-31 DIAGNOSIS — Z9981 Dependence on supplemental oxygen: Secondary | ICD-10-CM | POA: Insufficient documentation

## 2019-07-31 DIAGNOSIS — Z79890 Hormone replacement therapy (postmenopausal): Secondary | ICD-10-CM | POA: Diagnosis not present

## 2019-07-31 DIAGNOSIS — E785 Hyperlipidemia, unspecified: Secondary | ICD-10-CM | POA: Diagnosis not present

## 2019-07-31 DIAGNOSIS — I272 Pulmonary hypertension, unspecified: Secondary | ICD-10-CM

## 2019-07-31 DIAGNOSIS — R0600 Dyspnea, unspecified: Secondary | ICD-10-CM

## 2019-07-31 DIAGNOSIS — R5381 Other malaise: Secondary | ICD-10-CM

## 2019-07-31 DIAGNOSIS — R0609 Other forms of dyspnea: Secondary | ICD-10-CM

## 2019-07-31 DIAGNOSIS — E669 Obesity, unspecified: Secondary | ICD-10-CM | POA: Diagnosis not present

## 2019-07-31 DIAGNOSIS — K219 Gastro-esophageal reflux disease without esophagitis: Secondary | ICD-10-CM | POA: Insufficient documentation

## 2019-07-31 MED ORDER — TORSEMIDE 20 MG PO TABS: 60.0000 mg | ORAL_TABLET | Freq: Every day | ORAL | 3 refills | Status: DC

## 2019-07-31 NOTE — Patient Instructions (Addendum)
We will cancel Paramedicine  STOP Lasix  START Torsemide 60mg  (3 tabs) daily  You have been referred to [facility], you will be contacted by them to discuss services  You have been referred to Pulmonary.  They will call you to schedule an appointment. Please call us if you do not hear from them.   Labs in 10 days We will only contact you if something comes back abnormal or we need to make some changes. Otherwise no news is good news!  Your physician has requested that you have an echocardiogram. Echocardiography is a painless test that uses sound waves to create images of your heart. It provides your doctor with information about the size and shape of your heart and how well your heart's chambers and valves are working. This procedure takes approximately one hour. There are no restrictions for this procedure.  Your physician recommends that you schedule a follow-up appointment in: 4 weeks with Nurse Practitioner  Please call office at [phone] option 2 if you have any questions or concerns.   At the [facility], you and your health needs are our priority. As part of our continuing mission to provide you with exceptional heart care, we have created designated Provider Care Teams. These Care Teams include your primary Cardiologist (physician) and Advanced Practice Providers (APPs- Physician Assistants and Nurse Practitioners) who all work together to provide you with the care you need, when you need it.   You may see any of the following providers on your designated Care Team at your next follow up: Marland Kitchen Dr Glori Bickers . Dr Loralie Champagne . Darrick Grinder, NP . Lyda Jester, PA . Audry Riles, PharmD   Please be sure to bring in all your medications bottles to every appointment.

## 2019-07-31 NOTE — Progress Notes (Signed)
PCP: Dr Jonelle Sidle Primary Cardiologist: Dr Aundra Dubin   HPI: Michele Owens is a 59 y.o. female with a history of HTN, obesity, OSA, COPD, hyperlipidemia, diastolic CHF,and SVT.   She was admitted in 7/02 with diastolic CHF and COPD exacerbation. She was admitted in 1/17 with COPD exacerbation. She was set up for a Cardiolite in 2/17. This showed a small, moderate intensity partially reversible inferolateral defect. LHC/RHC in 3/17 showed no significant CAD, mildly elevated PCWP. PFTs in 3/17 showed severe COPD.   Admitted 10/15-10/19/17 with AECOPD and A/CdiastolicCHF. Diuresed with IV lasix. Completed course for CAP. Pt also noted have multinodular goiter on [place]. 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   Echo in 4/18 showed EF 45-50%, moderate dilated RV with moderately decreased systolic function.   She was admitted again in 4/18 with COPD exacerbation, acute on chronic diastolic CHF, and CAP.  She was admitted in 3/20 with acute on chronic diastolic CHF and treated with IV Lasix. Echo showed EF 60-65%, mild RV enlargement with normal function, respirophasic septal variation.  She has been in the ED 07/11/19, 07/08/19, 07/05/19, 06/28/19, 06/25/19, 06/23/19 for abdominal pain/chest pain. CT abdomen was stable. Given IV lasix on a couple occasions.   Today she returns for HF follow up.Overall feeling fair. Having ongoing shortness of breath with exertion.  Wears 2 liters oxygen fine. Denies PND/Orthopnea. Appetite ok. No fever or chills. She has not been weighing at home.  Taking all medications. She lives alone. Uses [employer] for all transportation. She does not want HF Paramedicine.   ROS: All systems negative except as listed in HPI, PMH and Problem List.  SH:  Social History   Socioeconomic History  . Marital status: Single    Spouse name: Not on file  . Number of children: Not on file  . Years of education: Not on file  . Highest education level:  Not on file  Occupational History  . Occupation: unemployeed  Tobacco Use  . Smoking status: Former Smoker    Packs/day: 0.50    Years: 41.00    Pack years: 20.50    Types: Cigarettes    Quit date: 07/2016    Years since quitting: 3.0  . Smokeless tobacco: Never Used  . Tobacco comment: Starting to Wean Off -- using Nicotine Patch  Substance and Sexual Activity  . Alcohol use: Not Currently    Alcohol/week: 0.0 standard drinks    Comment: ? none now  . Drug use: Never    Comment: none for long time  . Sexual activity: Yes  Other Topics Concern  . Not on file  Social History Narrative  . Not on file   Social Determinants of Health   Financial Resource Strain:   . Difficulty of Paying Living Expenses:   Food Insecurity:   . Worried About Charity fundraiser in the Last Year:   . Arboriculturist in the Last Year:   Transportation Needs:   . Film/video editor (Medical):   Marland Kitchen Lack of Transportation (Non-Medical):   Physical Activity:   . Days of Exercise per Week:   . Minutes of Exercise per Session:   Stress:   . Feeling of Stress :   Social Connections:   . Frequency of Communication with Friends and Family:   . Frequency of Social Gatherings with Friends and Family:   . Attends Religious Services:   . Active Member of Clubs or Organizations:   . Attends Club or  Organization Meetings:   Marland Kitchen Marital Status:   Intimate Partner Violence:   . Fear of Current or Ex-Partner:   . Emotionally Abused:   Marland Kitchen Physically Abused:   . Sexually Abused:     FH:  Family History  Problem Relation Age of Onset  . Cancer Father        thought to be due to exposure to concrete  . Diabetes Mother   . Thyroid cancer Mother        dx in her 55s-60s  . Cancer Maternal Uncle        2 uncles with cancer NOS  . Brain cancer Paternal Aunt   . Cancer Cousin        maternal first cousin - NOS  . Cancer Cousin        maternal first cousin - NOS    Past Medical History:  Diagnosis  Date  . Anxiety   . Arthritis   . Asthma   . Chronic diastolic CHF (congestive heart failure) ([place])   . COPD (chronic obstructive pulmonary disease) ([facility])    Uses Oxygen at night  . Depression   . Diabetes mellitus without complication ([place])   . Dyspnea    occ  . Family history of thyroid cancer   . GERD (gastroesophageal reflux disease)   . Gout   . Headache    migraines  . History of DVT of lower extremity   . History of nuclear stress test    Myoview 2/17:  Low risk stress nuclear study with a small, moderate intensity, partially reversible inferior lateral defect consistent with small prior infarct and minimal peri-infarct ischemia; EF 68 with normal wall motion  . Hypertension   . Pneumonia   . Thyroid cancer ([place]) 09/23/2016    Current Outpatient Medications  Medication Sig Dispense Refill  . albuterol (PROVENTIL HFA;VENTOLIN HFA) 108 (90 Base) MCG/ACT inhaler Inhale 1-2 puffs into the lungs every 6 (six) hours as needed for wheezing or shortness of breath. 1 Inhaler 0  . allopurinol (ZYLOPRIM) 100 MG tablet Take 100 mg by mouth daily.    [name] ELLIPTA 62.5-25 MCG/INH AEPB Inhale 1 puff into the lungs daily.     . Blood Glucose Monitoring Suppl (ACCU-CHEK AVIVA PLUS) w/Device KIT 1 each by Other route daily.   0  . butalbital-acetaminophen-caffeine (FIORICET, ESGIC) 50-325-40 MG tablet Take 1-2 tablets by mouth every 4 (four) hours as needed for migraine.    . calcitRIOL (ROCALTROL) 0.5 MCG capsule Take 1 capsule (0.5 mcg total) by mouth daily. 30 capsule 0  . carvedilol (COREG) 6.25 MG tablet Take 1 tablet (6.25 mg total) by mouth 2 (two) times daily with a meal. 60 tablet 0  . cetirizine (ZYRTEC) 10 MG tablet Take 10 mg by mouth daily.  2  . clonazePAM (KLONOPIN) 2 MG tablet Take 1 tablet (2 mg total) by mouth 3 (three) times daily. for anxiety 10 tablet 0  . diclofenac sodium (VOLTAREN) 1 % GEL Apply 2 g topically 4 (four) times daily as needed (for pain). (Patient not  taking: Reported on 06/18/2019) 1 Tube 0  . dicyclomine (BENTYL) 20 MG tablet Take 1 tablet (20 mg total) by mouth every 6 (six) hours as needed for spasms. 12 tablet 0  . EPINEPHrine 0.3 mg/0.3 mL IJ SOAJ injection Inject 0.3 mg into the muscle daily as needed (allergic reaction).    . FEROSUL 325 (65 Fe) MG tablet Take 325 mg by mouth daily.  3  .  furosemide (LASIX) 20 MG tablet Take 2 tablets (40 mg total) by mouth 2 (two) times daily. Take 38m every morning and 422mevery evening 360 tablet 0  . glucose blood test strip Use as instructed to check blood sugar up to 4 times daily (Patient taking differently: 1 each by Other route See admin instructions. Use as instructed to check blood sugar up to 4 times daily) 100 each 0  . guaiFENesin (MUCINEX) 600 MG 12 hr tablet Take 2 tablets (1,200 mg total) by mouth 2 (two) times daily. 60 tablet 0  . HYDROcodone-acetaminophen (NORCO/VICODIN) 5-325 MG tablet Take 1 tablet by mouth every 6 (six) hours as needed for moderate pain.   0  . hydrOXYzine (ATARAX/VISTARIL) 25 MG tablet Take 1 tablet (25 mg total) by mouth every 6 (six) hours. 12 tablet 0  . ipratropium-albuterol (DUONEB) 0.5-2.5 (3) MG/3ML SOLN Take 3 mLs by nebulization 4 (four) times daily.   3  . levothyroxine (SYNTHROID, LEVOTHROID) 112 MCG tablet Take 224 mcg by mouth daily.  5  . lidocaine (LIDODERM) 5 % Place 1 patch onto the skin daily. Remove & Discard patch within 12 hours or as directed by MD 5 patch 0  . lisinopril (PRINIVIL,ZESTRIL) 5 MG tablet Take 1 tablet (5 mg total) by mouth daily. Please call for office visit [phone] tablet 0  . loperamide (IMODIUM) 2 MG capsule Take 1 capsule (2 mg total) by mouth 4 (four) times daily as needed for diarrhea or loose stools. 12 capsule 0  . metFORMIN (GLUCOPHAGE) 500 MG tablet Take 1 tablet (500 mg total) by mouth 2 (two) times daily with a meal. 30 tablet 1  . methocarbamol (ROBAXIN) 500 MG tablet Take 1 tablet (500 mg total) by mouth  every 8 (eight) hours as needed for muscle spasms. 20 tablet 0  . metoCLOPramide (REGLAN) 10 MG tablet Take 1 tablet (10 mg total) by mouth every 6 (six) hours as needed for nausea (or headache). 30 tablet 0  . metoprolol succinate (TOPROL-XL) 25 MG 24 hr tablet Take 25 mg by mouth in the morning.     . montelukast (SINGULAIR) 10 MG tablet Take 1 tablet (10 mg total) by mouth at bedtime. 30 tablet 0  . nitroGLYCERIN (NITROSTAT) 0.4 MG SL tablet Place 1 tablet (0.4 mg total) under the tongue every 5 (five) minutes as needed for chest pain. (Patient not taking: Reported on 06/05/2019) 12 tablet 0  . nystatin cream (MYCOSTATIN) Apply 1 application topically 2 (two) times daily as needed for dry skin.   1  . omeprazole (PRILOSEC) 40 MG capsule Take 40 mg by mouth daily.  2  . OXYGEN Inhale 2 L into the lungs at bedtime.    . potassium chloride SA (K-DUR,KLOR-CON) 20 MEQ tablet Take 1 tablet (20 mEq total) by mouth 2 (two) times daily. 180 tablet 0  . pravastatin (PRAVACHOL) 40 MG tablet Take 40 mg by mouth every evening.   2  . predniSONE (DELTASONE) 20 MG tablet 3 tabs po day one, then 2 po daily x 4 days (Patient taking differently: Take 20 mg by mouth See admin instructions. 3 tabs po day one, then 2 po daily x 4 days) 11 tablet 0  . QC STOOL SOFTENER PLS LAXATIVE 8.6-50 MG tablet Take 1 tablet by mouth at bedtime.  2  . tiotropium (SPIRIVA) 18 MCG inhalation capsule Place 18 mcg into inhaler and inhale daily.     No current facility-administered medications for this encounter.  Vitals:   07/31/19 1027  Weight: (!) 141.1 kg (311 lb)   Wt Readings from Last 3 Encounters:  07/31/19 (!) 141.1 kg (311 lb)  07/11/19 134 kg (295 lb 6.7 oz)  07/08/19 134 kg (295 lb 6.7 oz)    PHYSICAL EXAM:  General:  Well appearing. No resp difficulty HEENT: normal Neck: supple. JVP flat. Carotids 2+ bilaterally; no bruits. No lymphadenopathy or thryomegaly appreciated. Cor: PMI normal. Regular rate &  rhythm. No rubs, gallops or murmurs. Lungs: clear Abdomen: soft, nontender, nondistended. No hepatosplenomegaly. No bruits or masses. Good bowel sounds. Extremities: no cyanosis, clubbing, rash, edema Neuro: alert & orientedx3, cranial nerves grossly intact. Moves all 4 extremities w/o difficulty. Affect pleasant.  ASSESSMENT & PLAN:  1. Chronic Diastolic HF  Echoin 9/82 withEF 60-65%, mildly dilated RV with normal systolic function, respiratory variation of the IV septum =>most likely due to severe pulmonary disease, less likely constrictive pericarditis (normal-appearing pericardium on CT chest in 3/20).  NYHA III. Volume status elevated. Stop lasix and start torsemide 60 mg daily.  - Repeat ECHO  -Check BMET next week.   2. COPD, severe  -Refer to pulmonary  -Continue home oxygen   3. Deconditioning -Refer to [facility] for PT/OT evaluation   Referred to [facility] today. Follow up in 4 weeks with and ECHO.  She would benefit from ongoing HF Paramedicine.   Jase Himmelberger NP-C  12:17 PM

## 2019-08-01 ENCOUNTER — Telehealth (HOSPITAL_COMMUNITY): Payer: Self-pay

## 2019-08-01 NOTE — Telephone Encounter (Signed)
Received response from [facility] that they would not be able to accept this patient at this time.   As alternative, I called [facility] and they are able to accept this patient.  Referral, office notes, and insurance documents faxed to [facility] at [phone]. Confirmation received.

## 2019-08-06 DIAGNOSIS — I110 Hypertensive heart disease with heart failure: Secondary | ICD-10-CM

## 2019-08-07 ENCOUNTER — Ambulatory Visit: Payer: Medicaid Other | Attending: Internal Medicine

## 2019-08-07 ENCOUNTER — Telehealth (HOSPITAL_COMMUNITY): Payer: Self-pay

## 2019-08-07 DIAGNOSIS — Z23 Encounter for immunization: Secondary | ICD-10-CM

## 2019-08-07 NOTE — Progress Notes (Signed)
   Covid-19 Vaccination Clinic  Name:  Michele Owens    MRN: [id number] DOB: 03-13-1961  08/07/2019  [name] was observed post Covid-19 immunization for 30 minutes based on pre-vaccination screening without incident. She was provided with Vaccine Information Sheet and instruction to access the V-Safe system.   [name] was instructed to call 911 with any severe reactions post vaccine: Marland Kitchen Difficulty breathing  . Swelling of face and throat  . A fast heartbeat  . A bad rash all over body  . Dizziness and weakness   Immunizations Administered    Name Date Dose VIS Date Route   Pfizer COVID-19 Vaccine 08/07/2019 11:18 AM 0.3 mL 06/04/2018 Intramuscular   Manufacturer: [place]   Lot: [id number]   [place]: [id number]

## 2019-08-07 NOTE — Telephone Encounter (Signed)
Patient message left to inform her of discharge from paramedicine program. Patient will be removed from list.

## 2019-08-12 ENCOUNTER — Other Ambulatory Visit (HOSPITAL_COMMUNITY): Payer: Medicaid Other

## 2019-08-20 ENCOUNTER — Telehealth (HOSPITAL_COMMUNITY): Payer: Self-pay

## 2019-08-20 NOTE — Telephone Encounter (Signed)
HH orders signed and faxed back to [facility]

## 2019-08-24 IMAGING — US US THYROID
1 series · 14 of 22 positions shown · non-contrast
Comparison: 01/24/2016

CLINICAL DATA: History of total thyroidectomy for right-sided
papillary thyroid carcinoma and left-sided medullary thyroid
carcinoma.

EXAM:
ULTRASOUND OF HEAD/NECK SOFT TISSUES
TECHNIQUE: Ultrasound examination of the head and neck soft tissues was
performed in the area of clinical concern.

[Series 1: us thyroid · 0.10mm/px · 14 of 22 slices shown]
[im 1/22]
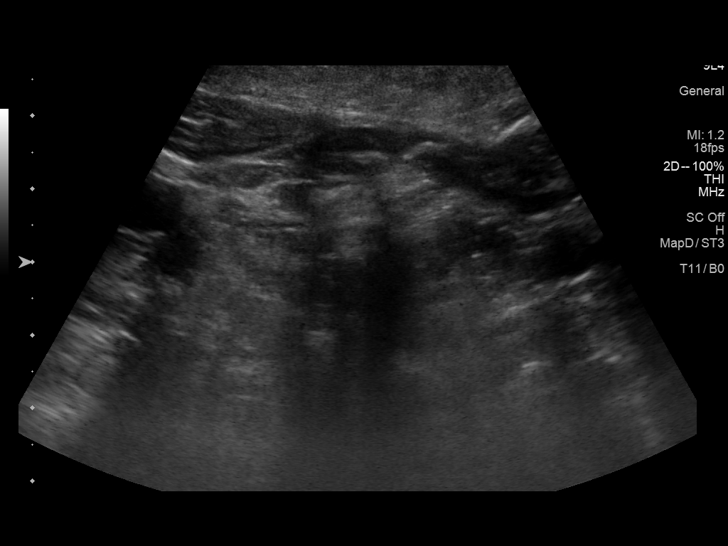
[im 3/22]
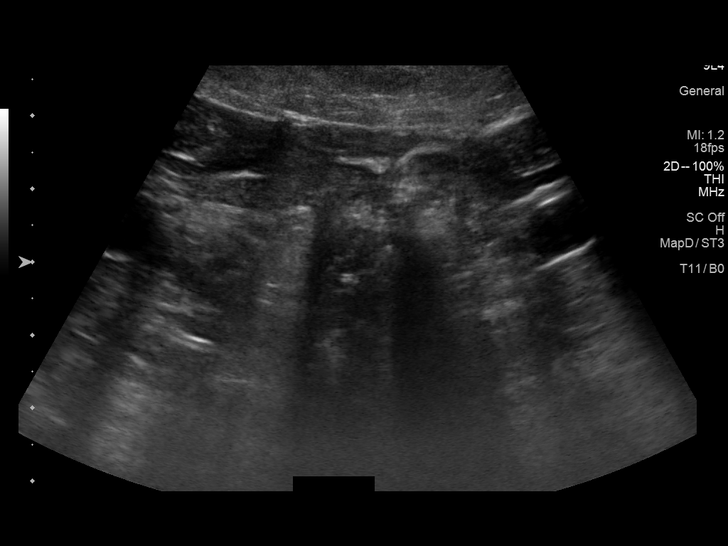
[im 4/22]
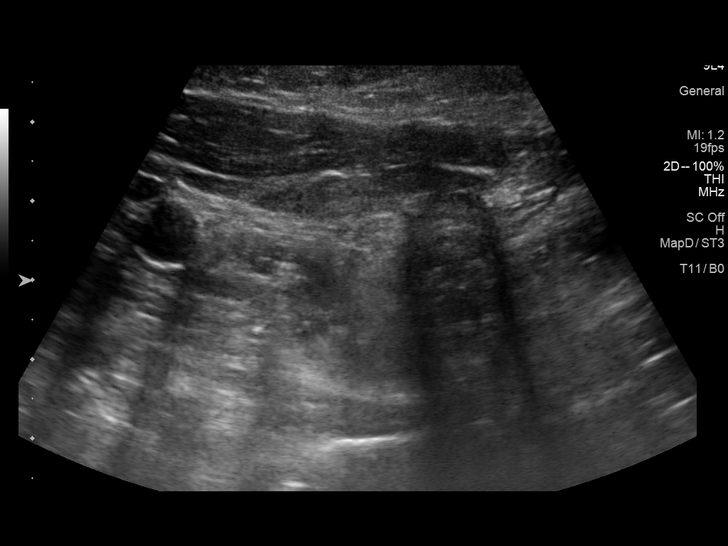
[im 6/22]
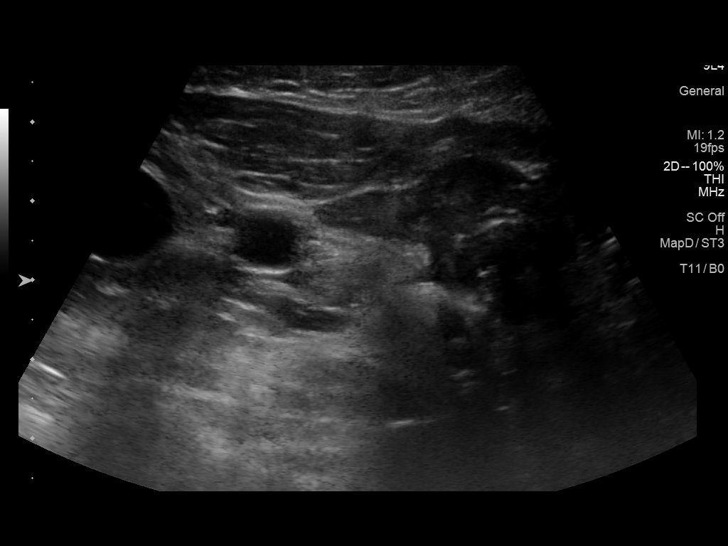
[im 8/22]
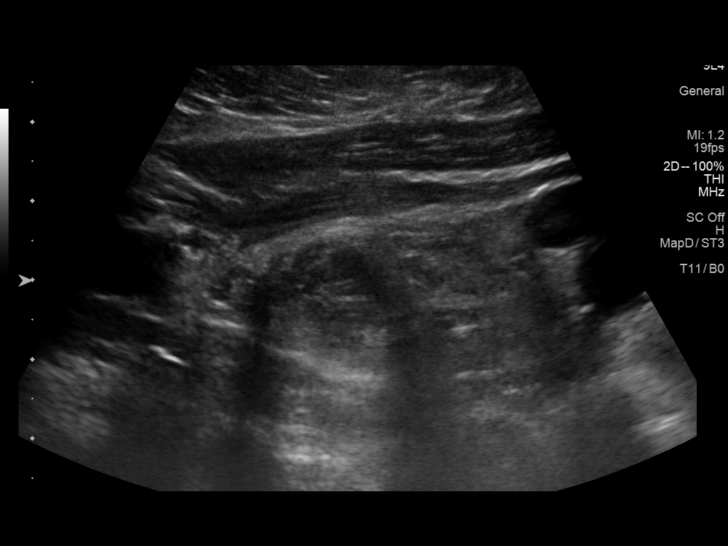
[im 9/22]
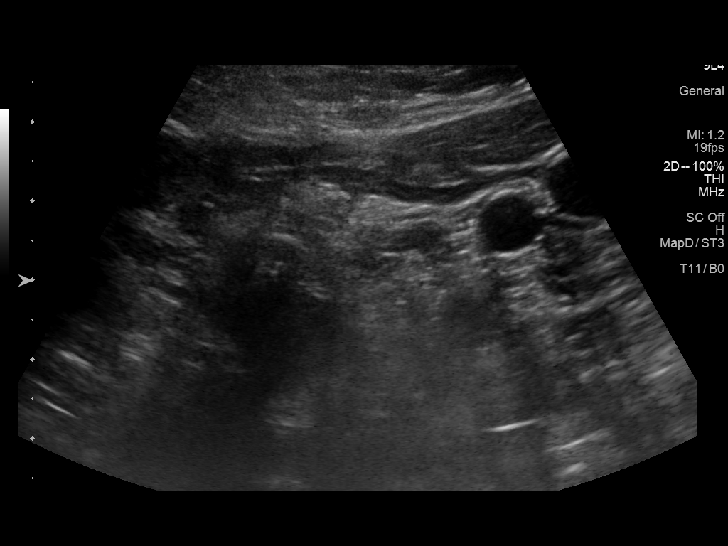
[im 11/22]
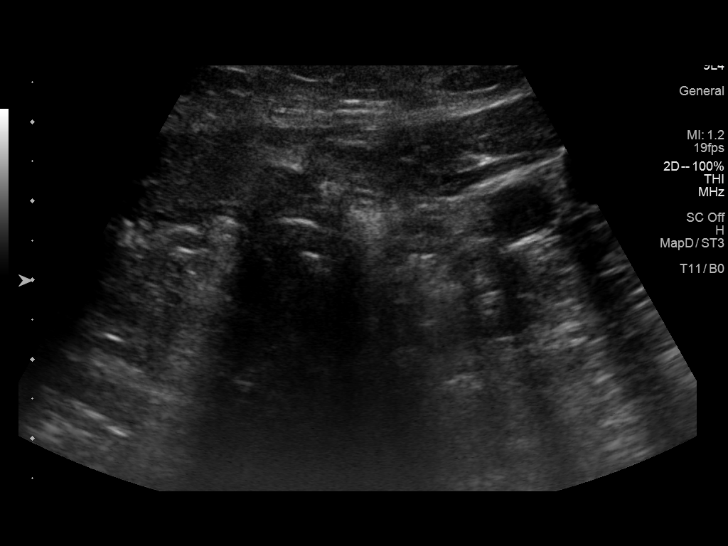
[im 12/22]
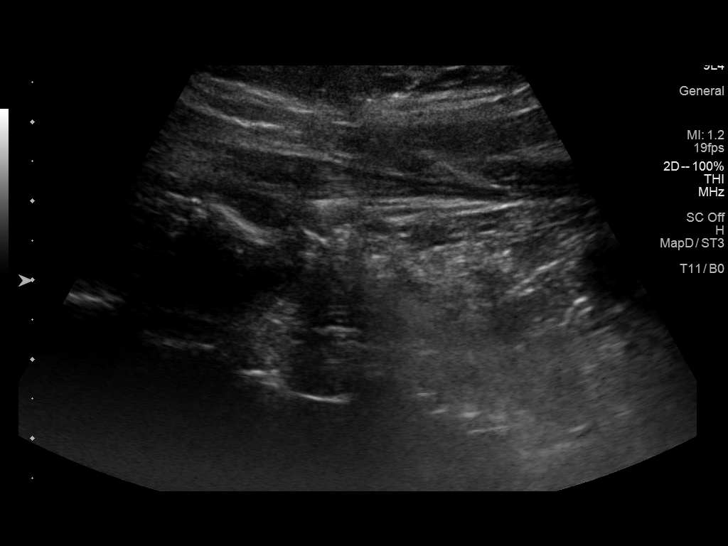
[im 14/22]
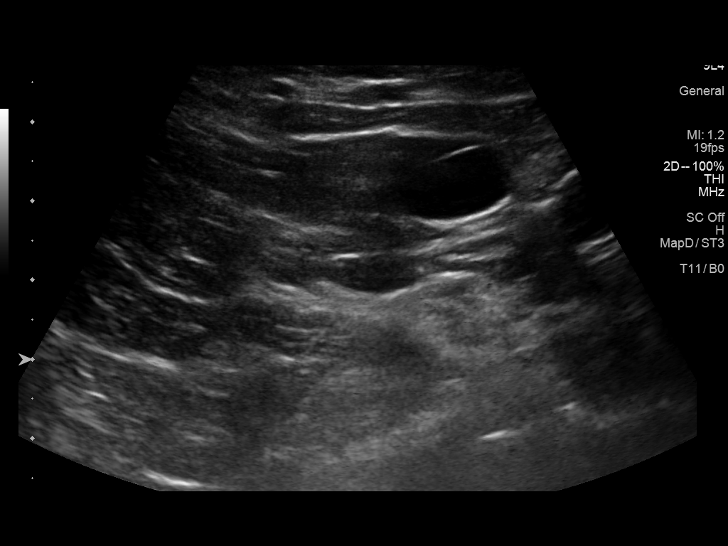
[im 15/22]
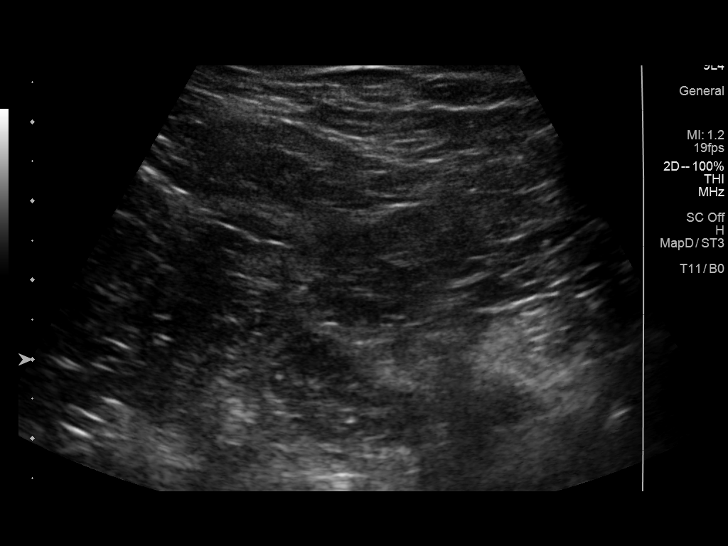
[im 17/22]
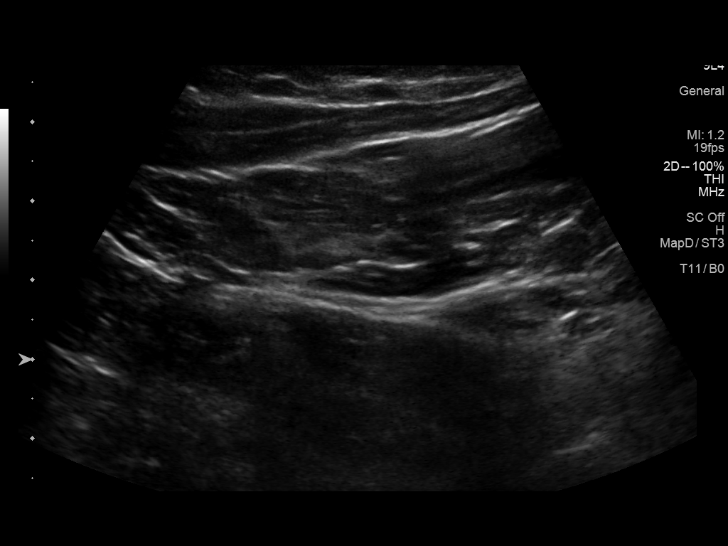
[im 19/22]
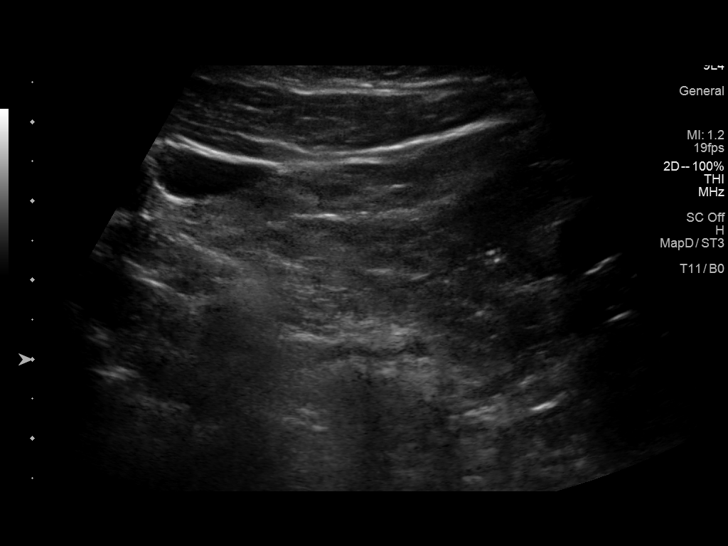
[im 20/22]
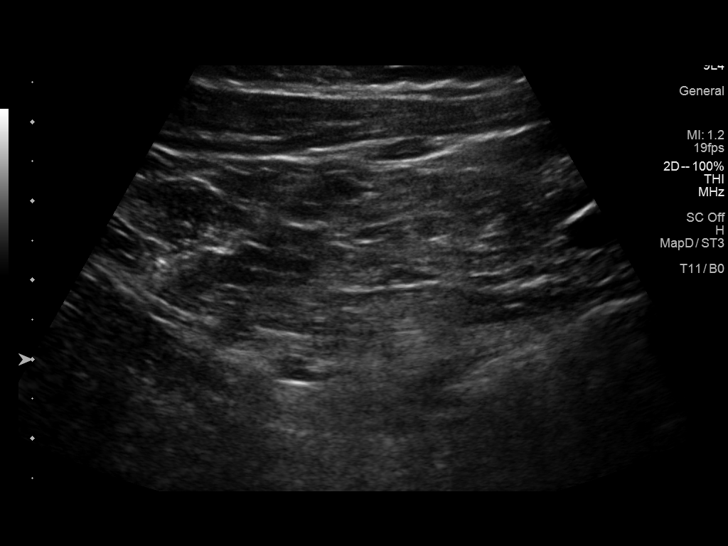
[im 22/22]
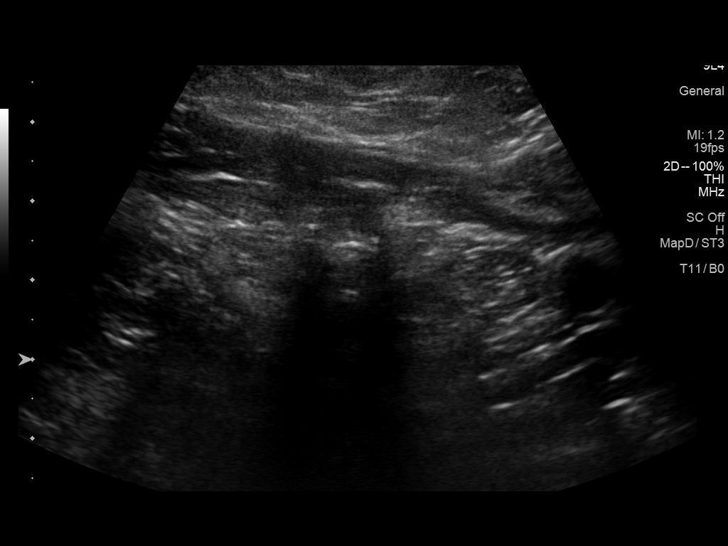

[14 of 22 positions shown; findings below may reference images not displayed]

FINDINGS: Examination of the neck demonstrates no residual identifiable
thyroid tissue by ultrasound. No abnormal tissue is identified. No
abnormal appearing or enlarged lymph nodes are identified by
ultrasound.
IMPRESSION: Post thyroidectomy ultrasound demonstrates no visible residual
thyroid tissue, abnormal nodular tissue or abnormal lymph nodes.

## 2019-08-26 ENCOUNTER — Telehealth (HOSPITAL_COMMUNITY): Payer: Self-pay | Admitting: *Deleted

## 2019-08-26 NOTE — Telephone Encounter (Signed)
Pt left VM wanting to r/s missed lab appt. I called pt back no answer/left VM for her to call our office back and select the option for scheduling to r/s missed lab visit.

## 2019-08-27 ENCOUNTER — Other Ambulatory Visit (HOSPITAL_COMMUNITY): Payer: Medicaid Other

## 2019-09-01 ENCOUNTER — Ambulatory Visit: Payer: Medicaid Other | Attending: Internal Medicine

## 2019-09-01 DIAGNOSIS — Z23 Encounter for immunization: Secondary | ICD-10-CM

## 2019-09-01 NOTE — Progress Notes (Signed)
   Covid-19 Vaccination Clinic  Name:  Trejure Bergamo    MRN: [id number] DOB: [birth year]  09/01/2019  Ms. Mckibbin was observed post Covid-19 immunization for 30 minutes based on pre-vaccination screening without incident. She was provided with Vaccine Information Sheet and instruction to access the V-Safe system.   Ms. Yusupov was instructed to call 911 with any severe reactions post vaccine: Marland Kitchen Difficulty breathing  . Swelling of face and throat  . A fast heartbeat  . A bad rash all over body  . Dizziness and weakness   Immunizations Administered    Name Date Dose VIS Date Route   Pfizer COVID-19 Vaccine 09/01/2019  9:55 AM 0.3 mL 06/04/2018 Intramuscular   Manufacturer: [place]   Lot: [id number]   [place]: [id number]

## 2019-09-02 ENCOUNTER — Other Ambulatory Visit: Payer: Self-pay

## 2019-09-02 ENCOUNTER — Ambulatory Visit (HOSPITAL_BASED_OUTPATIENT_CLINIC_OR_DEPARTMENT_OTHER)
Admission: RE | Admit: 2019-09-02 | Discharge: 2019-09-02 | Disposition: A | Discharge status: Home / Self Care | Payer: Medicaid Other | Source: Ambulatory Visit | Attending: Cardiology | Admitting: Cardiology

## 2019-09-02 ENCOUNTER — Ambulatory Visit (HOSPITAL_COMMUNITY)
Admission: RE | Admit: 2019-09-02 | Discharge: 2019-09-02 | Disposition: A | Discharge status: Home / Self Care | Payer: Medicaid Other | Source: Ambulatory Visit | Attending: Cardiology | Admitting: Cardiology

## 2019-09-02 ENCOUNTER — Encounter (HOSPITAL_COMMUNITY): Payer: Self-pay

## 2019-09-02 VITALS — BP 116/74 | HR 86 | Wt 315.8 lb

## 2019-09-02 DIAGNOSIS — E785 Hyperlipidemia, unspecified: Secondary | ICD-10-CM | POA: Diagnosis not present

## 2019-09-02 DIAGNOSIS — J449 Chronic obstructive pulmonary disease, unspecified: Secondary | ICD-10-CM | POA: Diagnosis not present

## 2019-09-02 DIAGNOSIS — Z87891 Personal history of nicotine dependence: Secondary | ICD-10-CM | POA: Insufficient documentation

## 2019-09-02 DIAGNOSIS — M109 Gout, unspecified: Secondary | ICD-10-CM | POA: Insufficient documentation

## 2019-09-02 DIAGNOSIS — F329 Major depressive disorder, single episode, unspecified: Secondary | ICD-10-CM | POA: Diagnosis not present

## 2019-09-02 DIAGNOSIS — I110 Hypertensive heart disease with heart failure: Secondary | ICD-10-CM | POA: Insufficient documentation

## 2019-09-02 DIAGNOSIS — Z791 Long term (current) use of non-steroidal anti-inflammatories (NSAID): Secondary | ICD-10-CM | POA: Diagnosis not present

## 2019-09-02 DIAGNOSIS — K219 Gastro-esophageal reflux disease without esophagitis: Secondary | ICD-10-CM | POA: Insufficient documentation

## 2019-09-02 DIAGNOSIS — I471 Supraventricular tachycardia: Secondary | ICD-10-CM | POA: Diagnosis not present

## 2019-09-02 DIAGNOSIS — Z85850 Personal history of malignant neoplasm of thyroid: Secondary | ICD-10-CM | POA: Diagnosis not present

## 2019-09-02 DIAGNOSIS — I272 Pulmonary hypertension, unspecified: Secondary | ICD-10-CM

## 2019-09-02 DIAGNOSIS — F419 Anxiety disorder, unspecified: Secondary | ICD-10-CM | POA: Diagnosis not present

## 2019-09-02 DIAGNOSIS — Z7984 Long term (current) use of oral hypoglycemic drugs: Secondary | ICD-10-CM | POA: Insufficient documentation

## 2019-09-02 DIAGNOSIS — Z833 Family history of diabetes mellitus: Secondary | ICD-10-CM | POA: Insufficient documentation

## 2019-09-02 DIAGNOSIS — Z7952 Long term (current) use of systemic steroids: Secondary | ICD-10-CM | POA: Diagnosis not present

## 2019-09-02 DIAGNOSIS — Z8701 Personal history of pneumonia (recurrent): Secondary | ICD-10-CM | POA: Insufficient documentation

## 2019-09-02 DIAGNOSIS — I5032 Chronic diastolic (congestive) heart failure: Secondary | ICD-10-CM | POA: Insufficient documentation

## 2019-09-02 DIAGNOSIS — M1990 Unspecified osteoarthritis, unspecified site: Secondary | ICD-10-CM | POA: Insufficient documentation

## 2019-09-02 DIAGNOSIS — Z9981 Dependence on supplemental oxygen: Secondary | ICD-10-CM | POA: Diagnosis not present

## 2019-09-02 DIAGNOSIS — E119 Type 2 diabetes mellitus without complications: Secondary | ICD-10-CM | POA: Insufficient documentation

## 2019-09-02 DIAGNOSIS — G4733 Obstructive sleep apnea (adult) (pediatric): Secondary | ICD-10-CM | POA: Insufficient documentation

## 2019-09-02 DIAGNOSIS — Z86718 Personal history of other venous thrombosis and embolism: Secondary | ICD-10-CM | POA: Diagnosis not present

## 2019-09-02 DIAGNOSIS — Z9119 Patient's noncompliance with other medical treatment and regimen: Secondary | ICD-10-CM | POA: Insufficient documentation

## 2019-09-02 DIAGNOSIS — Z79899 Other long term (current) drug therapy: Secondary | ICD-10-CM | POA: Insufficient documentation

## 2019-09-02 DIAGNOSIS — Z808 Family history of malignant neoplasm of other organs or systems: Secondary | ICD-10-CM | POA: Insufficient documentation

## 2019-09-02 DIAGNOSIS — J45909 Unspecified asthma, uncomplicated: Secondary | ICD-10-CM | POA: Insufficient documentation

## 2019-09-02 LAB — BASIC METABOLIC PANEL
Anion gap: 9 (ref 5–15)
BUN: 10 mg/dL (ref 6–20)
CO2: 32 mmol/L (ref 22–32)
Calcium: 9.1 mg/dL (ref 8.9–10.3)
Chloride: 97 mmol/L — ABNORMAL LOW (ref 98–111)
Creatinine, Ser: 0.6 mg/dL (ref 0.44–1.00)
GFR calc Af Amer: >60 mL/min (ref >60)
GFR calc non Af Amer: >60 mL/min (ref >60)
Glucose, Bld: 110 mg/dL — ABNORMAL HIGH (ref 70–99)
Potassium: 4.3 mmol/L (ref 3.5–5.1)
Sodium: 138 mmol/L (ref 135–145)

## 2019-09-02 LAB — BRAIN NATRIURETIC PEPTIDE: B Natriuretic Peptide: 59.7 pg/mL (ref 0.0–100.0)

## 2019-09-02 NOTE — Progress Notes (Signed)
PCP: Dr Jonelle Sidle Primary Cardiologist: Dr Aundra Dubin   F/u for Chronic Diastolic Heart Failure   HPI: Michele Owens is a 58 y.o. female with a history of HTN, obesity, OSA, COPD, hyperlipidemia, diastolic CHF,and SVT.   She was admitted in 5/00 with diastolic CHF and COPD exacerbation. She was admitted in 1/17 with COPD exacerbation. She was set up for a Cardiolite in 2/17. This showed a small, moderate intensity partially reversible inferolateral defect. LHC/RHC in 3/17 showed no significant CAD, mildly elevated PCWP. PFTs in 3/17 showed severe COPD.   Admitted 10/15-10/19/17 with AECOPD and A/CdiastolicCHF. Diuresed with IV lasix. Completed course for CAP. Pt also noted have multinodular goiter on [place]. 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   Echo in 4/18 showed EF 45-50%, moderate dilated RV with moderately decreased systolic function.   She was admitted again in 4/18 with COPD exacerbation, acute on chronic diastolic CHF, and CAP.  She was admitted in 3/20 with acute on chronic diastolic CHF and treated with IV Lasix. Echo showed EF 60-65%, mild RV enlargement with normal function, respirophasic septal variation.  She has been in the ED 07/11/19, 07/08/19, 07/05/19, 06/28/19, 06/25/19, 06/23/19 for abdominal pain/chest pain. CT abdomen was stable. Given IV lasix on a couple occasions.   She had recent f/u last month and was felt to be mildly volume overloaded w/ poor response to PO Lasix. Diuretic was changed to torsemide 60 mg daily. Referral was also placed to pulmonology. Repeat 2D echo also ordered.   She presents back for f/u. Notes poor response to torsemide. Wt continues to trend up. This is my first time seeing her but she notes her abdomen is more edematous and legs more swollen (~ 1 + on my exam). She uses supp O2 24/7 but denies any increased O2 requirements.   Had repeat 2D Echo today which showed normal LVEF 55-60%. RV mildly enlarged  but normal systolic function. Pulmonary pressures could not be estimated.   When asked about compliance w/ CPAP, pt claims she does not have OSA (despite report in [place]). She claims she has had 2 prior sleep studies and "passed both".   2D Echo 09/02/19    1. Very technically difficult study. Grossly normal LVEF. Pulmonary pressures could not be estimated. 2. Left ventricular ejection fraction, by estimation, is 55 to 60%. The left ventricle has normal function. The left ventricle has no regional wall motion abnormalities. There is mild left ventricular hypertrophy. Left ventricular diastolic parameters are indeterminate. 3. Right ventricular systolic function is normal. The right ventricular size is mildly enlarged. Tricuspid regurgitation signal is inadequate for assessing PA pressure. 4. The mitral valve was not well visualized. No evidence of mitral valve regurgitation. 5. The aortic valve was not well visualized. Aortic valve regurgitation is not visualized.  ROS: All systems negative except as listed in HPI, PMH and Problem List.  SH:  Social History   Socioeconomic History  . Marital status: Single    Spouse name: Not on file  . Number of children: Not on file  . Years of education: Not on file  . Highest education level: Not on file  Occupational History  . Occupation: unemployeed  Tobacco Use  . Smoking status: Former Smoker    Packs/day: 0.50    Years: 41.00    Pack years: 20.50    Types: Cigarettes    Quit date: 07/2016    Years since quitting: 3.1  . Smokeless tobacco: Never Used  . Tobacco  comment: Starting to Wean Off -- using Nicotine Patch  Substance and Sexual Activity  . Alcohol use: Not Currently    Alcohol/week: 0.0 standard drinks    Comment: ? none now  . Drug use: Never    Comment: none for long time  . Sexual activity: Yes  Other Topics Concern  . Not on file  Social History Narrative  . Not on file   Social Determinants of Health    Financial Resource Strain:   . Difficulty of Paying Living Expenses:   Food Insecurity:   . Worried About Charity fundraiser in the Last Year:   . Arboriculturist in the Last Year:   Transportation Needs:   . Film/video editor (Medical):   Marland Kitchen Lack of Transportation (Non-Medical):   Physical Activity:   . Days of Exercise per Week:   . Minutes of Exercise per Session:   Stress:   . Feeling of Stress :   Social Connections:   . Frequency of Communication with Friends and Family:   . Frequency of Social Gatherings with Friends and Family:   . Attends Religious Services:   . Active Member of Clubs or Organizations:   . Attends Archivist Meetings:   Marland Kitchen Marital Status:   Intimate Partner Violence:   . Fear of Current or Ex-Partner:   . Emotionally Abused:   Marland Kitchen Physically Abused:   . Sexually Abused:     FH:  Family History  Problem Relation Age of Onset  . Cancer Father        thought to be due to exposure to concrete  . Diabetes Mother   . Thyroid cancer Mother        dx in her 32s-60s  . Cancer Maternal Uncle        2 uncles with cancer NOS  . Brain cancer Paternal Aunt   . Cancer Cousin        maternal first cousin - NOS  . Cancer Cousin        maternal first cousin - NOS    Past Medical History:  Diagnosis Date  . Anxiety   . Arthritis   . Asthma   . Chronic diastolic CHF (congestive heart failure) ([place])   . COPD (chronic obstructive pulmonary disease) ([facility])    Uses Oxygen at night  . Depression   . Diabetes mellitus without complication ([place])   . Dyspnea    occ  . Family history of thyroid cancer   . GERD (gastroesophageal reflux disease)   . Gout   . Headache    migraines  . History of DVT of lower extremity   . History of nuclear stress test    Myoview 2/17:  Low risk stress nuclear study with a small, moderate intensity, partially reversible inferior lateral defect consistent with small prior infarct and minimal peri-infarct ischemia;  EF 68 with normal wall motion  . Hypertension   . Pneumonia   . Thyroid cancer ([place]) 09/23/2016    Current Outpatient Medications  Medication Sig Dispense Refill  . albuterol (PROVENTIL HFA;VENTOLIN HFA) 108 (90 Base) MCG/ACT inhaler Inhale 1-2 puffs into the lungs every 6 (six) hours as needed for wheezing or shortness of breath. 1 Inhaler 0  . allopurinol (ZYLOPRIM) 100 MG tablet Take 100 mg by mouth daily.    [name] ELLIPTA 62.5-25 MCG/INH AEPB Inhale 1 puff into the lungs daily.     . Blood Glucose Monitoring Suppl (ACCU-CHEK AVIVA PLUS) w/Device  KIT 1 each by Other route daily.   0  . butalbital-acetaminophen-caffeine (FIORICET, ESGIC) 50-325-40 MG tablet Take 1-2 tablets by mouth every 4 (four) hours as needed for migraine.    . calcitRIOL (ROCALTROL) 0.5 MCG capsule Take 1 capsule (0.5 mcg total) by mouth daily. 30 capsule 0  . carvedilol (COREG) 6.25 MG tablet Take 1 tablet (6.25 mg total) by mouth 2 (two) times daily with a meal. 60 tablet 0  . cetirizine (ZYRTEC) 10 MG tablet Take 10 mg by mouth daily.  2  . clonazePAM (KLONOPIN) 2 MG tablet Take 1 tablet (2 mg total) by mouth 3 (three) times daily. for anxiety 10 tablet 0  . diclofenac sodium (VOLTAREN) 1 % GEL Apply 2 g topically 4 (four) times daily as needed (for pain). 1 Tube 0  . dicyclomine (BENTYL) 20 MG tablet Take 1 tablet (20 mg total) by mouth every 6 (six) hours as needed for spasms. 12 tablet 0  . EPINEPHrine 0.3 mg/0.3 mL IJ SOAJ injection Inject 0.3 mg into the muscle daily as needed (allergic reaction).    . FEROSUL 325 (65 Fe) MG tablet Take 325 mg by mouth daily.  3  . glucose blood test strip Use as instructed to check blood sugar up to 4 times daily (Patient taking differently: 1 each by Other route See admin instructions. Use as instructed to check blood sugar up to 4 times daily) 100 each 0  . guaiFENesin (MUCINEX) 600 MG 12 hr tablet Take 2 tablets (1,200 mg total) by mouth 2 (two) times daily. 60 tablet 0  .  HYDROcodone-acetaminophen (NORCO/VICODIN) 5-325 MG tablet Take 1 tablet by mouth every 6 (six) hours as needed for moderate pain.   0  . hydrOXYzine (ATARAX/VISTARIL) 25 MG tablet Take 1 tablet (25 mg total) by mouth every 6 (six) hours. 12 tablet 0  . ipratropium-albuterol (DUONEB) 0.5-2.5 (3) MG/3ML SOLN Take 3 mLs by nebulization 4 (four) times daily.   3  . levothyroxine (SYNTHROID, LEVOTHROID) 112 MCG tablet Take 224 mcg by mouth daily.  5  . lidocaine (LIDODERM) 5 % Place 1 patch onto the skin daily. Remove & Discard patch within 12 hours or as directed by MD 5 patch 0  . lisinopril (PRINIVIL,ZESTRIL) 5 MG tablet Take 1 tablet (5 mg total) by mouth daily. Please call for office visit [phone] 30 tablet 0  . loperamide (IMODIUM) 2 MG capsule Take 1 capsule (2 mg total) by mouth 4 (four) times daily as needed for diarrhea or loose stools. 12 capsule 0  . metFORMIN (GLUCOPHAGE) 500 MG tablet Take 1 tablet (500 mg total) by mouth 2 (two) times daily with a meal. 30 tablet 1  . methocarbamol (ROBAXIN) 500 MG tablet Take 1 tablet (500 mg total) by mouth every 8 (eight) hours as needed for muscle spasms. 20 tablet 0  . metoCLOPramide (REGLAN) 10 MG tablet Take 1 tablet (10 mg total) by mouth every 6 (six) hours as needed for nausea (or headache). 30 tablet 0  . metoprolol succinate (TOPROL-XL) 25 MG 24 hr tablet Take 25 mg by mouth in the morning.     . montelukast (SINGULAIR) 10 MG tablet Take 1 tablet (10 mg total) by mouth at bedtime. 30 tablet 0  . nitroGLYCERIN (NITROSTAT) 0.4 MG SL tablet Place 1 tablet (0.4 mg total) under the tongue every 5 (five) minutes as needed for chest pain. 12 tablet 0  . nystatin cream (MYCOSTATIN) Apply 1 application topically 2 (two) times daily as  needed for dry skin.   1  . omeprazole (PRILOSEC) 40 MG capsule Take 40 mg by mouth daily.  2  . OXYGEN Inhale 2 L into the lungs at bedtime.    . potassium chloride SA (K-DUR,KLOR-CON) 20 MEQ tablet Take 1 tablet (20  mEq total) by mouth 2 (two) times daily. 180 tablet 0  . pravastatin (PRAVACHOL) 40 MG tablet Take 40 mg by mouth every evening.   2  . predniSONE (DELTASONE) 20 MG tablet 3 tabs po day one, then 2 po daily x 4 days (Patient taking differently: Take 20 mg by mouth See admin instructions. 3 tabs po day one, then 2 po daily x 4 days) 11 tablet 0  . QC STOOL SOFTENER PLS LAXATIVE 8.6-50 MG tablet Take 1 tablet by mouth at bedtime.  2  . tiotropium (SPIRIVA) 18 MCG inhalation capsule Place 18 mcg into inhaler and inhale daily.    Marland Kitchen torsemide (DEMADEX) 20 MG tablet Take 3 tablets (60 mg total) by mouth daily. 90 tablet 3   No current facility-administered medications for this encounter.    Vitals:   09/02/19 1118  Weight: (!) 143.2 kg (315 lb 12.8 oz)   Wt Readings from Last 3 Encounters:  09/02/19 (!) 143.2 kg (315 lb 12.8 oz)  07/31/19 (!) 141.1 kg (311 lb)  07/11/19 134 kg (295 lb 6.7 oz)    PHYSICAL EXAM:  General:  Morbidly obese AAF on supp O2 via [place]. No resp difficulty. Cantankerous  HEENT: normal Neck: JVD ~10 cm. Carotids 2+ bilaterally; no bruits. No lymphadenopathy or thryomegaly appreciated. Cor: PMI normal. Regular rate & rhythm. No rubs, gallops or murmurs. Lungs: clear Abdomen: obese, nontender, distended. No hepatosplenomegaly. No bruits or masses. Good bowel sounds. Extremities: no cyanosis, clubbing, rash, 1+ bilateral LEE edema Neuro: alert & orientedx3, cranial nerves grossly intact. Moves all 4 extremities w/o difficulty. Affect pleasant.  ASSESSMENT & PLAN:  1. Chronic Diastolic HF w/ Prominent RV Dysfunction  - Echoin 3/20 withEF 60-65%, mildly dilated RV with normal systolic function, respiratory variation of the IV septum =>most likely due to severe pulmonary disease, less likely constrictive pericarditis (normal-appearing pericardium on CT chest in 3/20).  - Echo repeated today. LVEF 55-60%. RV mildly enlarged but normal systolic function. Pulmonary  pressures could not be estimated.  - Suspect RV dysfunction 2/2 pulmonary HTN mainly related to underlying pulmonary disease, WHO Group 3, from COPD and suspect OSA/OHS (although pt denies diagnosis, stating she has had 2 prior sleep studies that were normal).  - She needs to f/u w/ pulmonology. A referral was placed at last OV. Awaiting appt.  - She is chronically NYHA III, confounded by morbid obesity, COPD and deconditioning  - She does appear grossly volume overloaded and poor response to PO diuretics. Based on my assessment, no indication for hospitalization currently but will refer to [facility] for IV Lasix, 80 mg daily x 3 days, then transition back to torsemide 60 mg daily  - Check BMP and BMP today for baseline assessment    2. COPD, severe  - on Home O2 24/7 - Suspect she also has OSA/OHS.  - Refer to pulmonary. Would recommend repeat sleep study      Desa Rech PA-C 11:19 AM

## 2019-09-02 NOTE — Progress Notes (Signed)
  Echocardiogram 2D Echocardiogram has been performed.  Michele Owens M 09/02/2019, 10:27 AM

## 2019-09-02 NOTE — Patient Instructions (Signed)
Labs today We will only contact you if something comes back abnormal or we need to make some changes. Otherwise no news is good news!  You have been referred to home health for IV lasix for 3 days.   DO NOT TAKE TORSEMIDE ON THE DAYS YOU ARE RECEIVING IV LASIX  RESUME REGULAR DOSE OF TORSEMIDE AFTER YOU HAVE RECEIVED 3 DOSES OF IV LASIX  Your physician recommends that you schedule a follow-up appointment in: [place]  Please call office at [phone] option 2 if you have any questions or concerns.   At the [facility], you and your health needs are our priority. As part of our continuing mission to provide you with exceptional heart care, we have created designated Provider Care Teams. These Care Teams include your primary Cardiologist (physician) and Advanced Practice Providers (APPs- Physician Assistants and Nurse Practitioners) who all work together to provide you with the care you need, when you need it.   You may see any of the following providers on your designated Care Team at your next follow up: Marland Kitchen Dr Glori Bickers . Dr Loralie Champagne . Darrick Grinder, NP . Lyda Jester, PA . Audry Riles, PharmD   Please be sure to bring in all your medications bottles to every appointment.

## 2019-09-03 ENCOUNTER — Telehealth (HOSPITAL_COMMUNITY): Payer: Self-pay | Admitting: *Deleted

## 2019-09-03 NOTE — Telephone Encounter (Signed)
Pt called stating she has not heard from remote [facility] yet. Pt told me she was very upset and that her weight was up 2 more lbs and her face was swollen. Pt wants [facility] to her house this morning to give her IV lasix. I spoke with our clinic coordinator Adline Potter and she called Reche Dixon with [facility]. Cecille Rubin stated they did not receive our referral. I sent a staff message through epic with patient information per Lori's request.

## 2019-09-04 IMAGING — CR DG LUMBAR SPINE 2-3V
3 series · 3 of 3 positions shown · non-contrast
Comparison: Lumbar spine plain film dated 04/21/2013.

CLINICAL DATA: Chronic midline lower back pain.

EXAM:
LUMBAR SPINE - 2-3 VIEW

[t l-spine a.p. *]
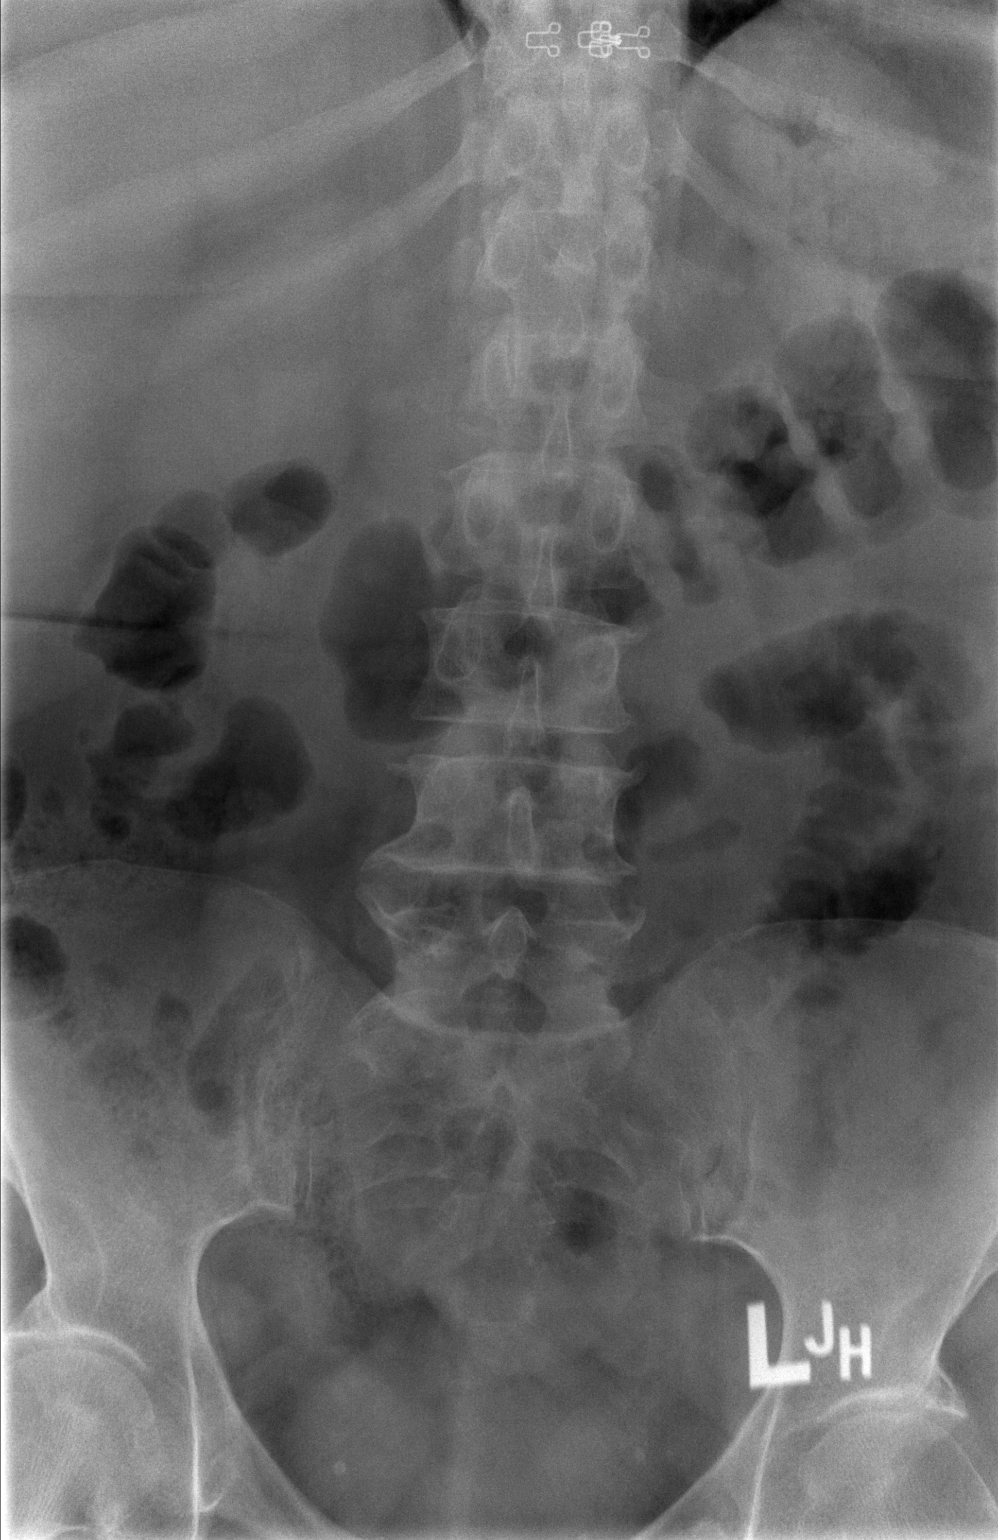

[t l-spine lat *]
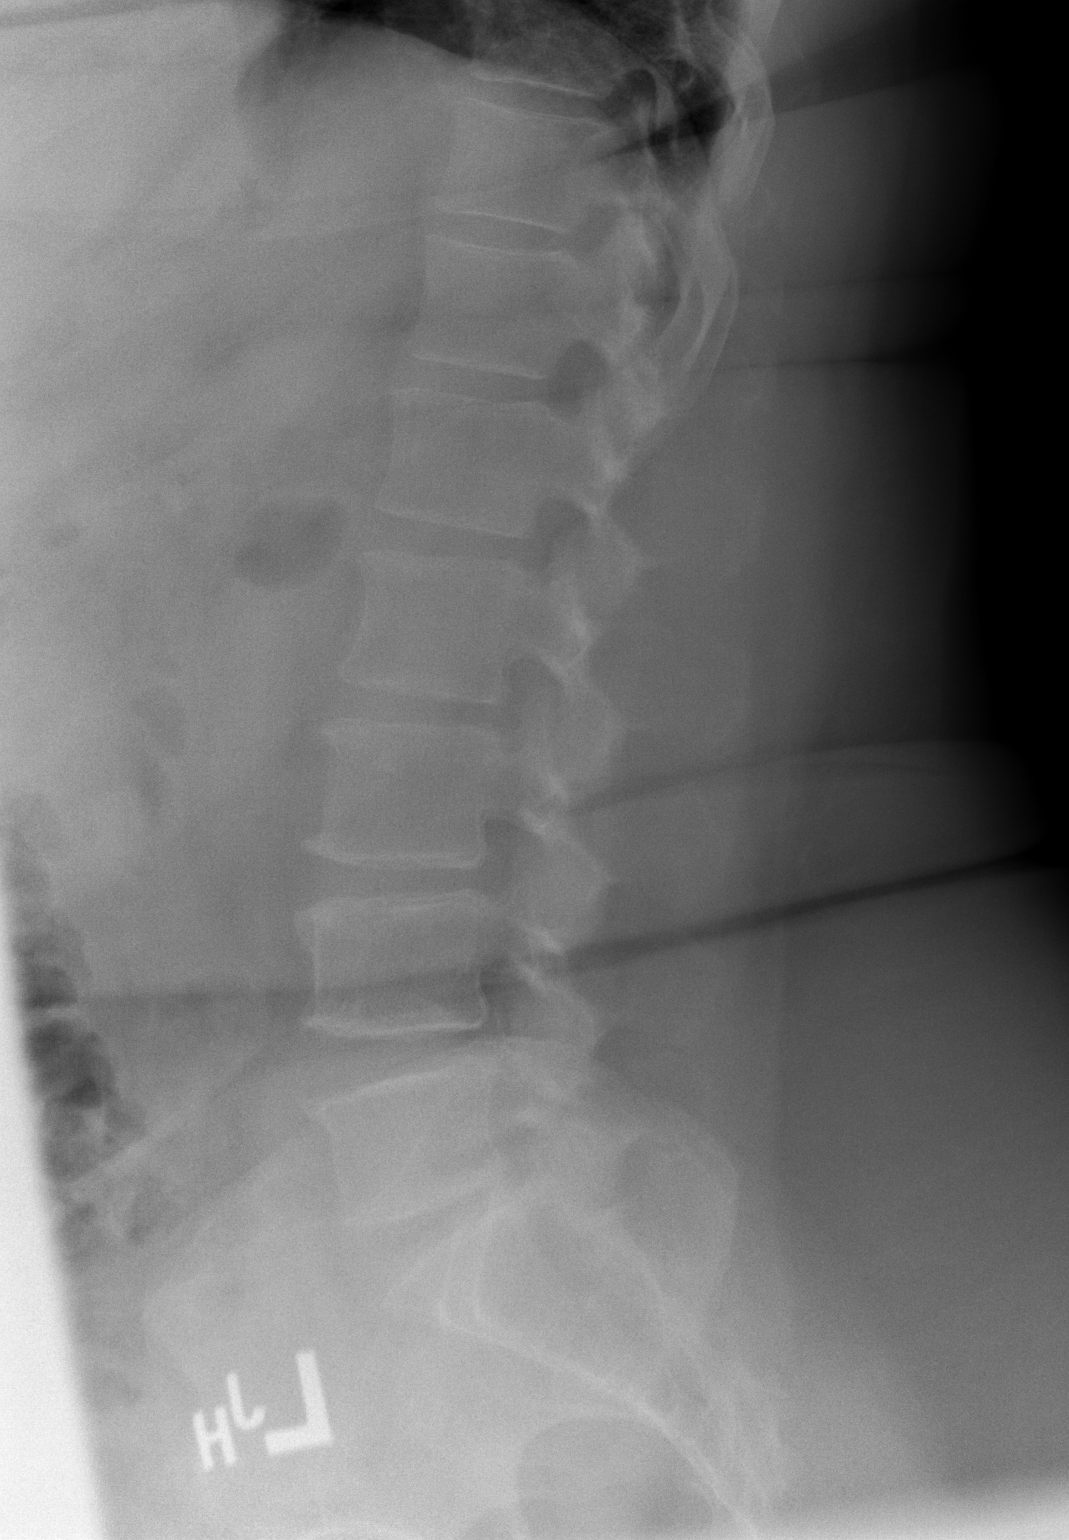

[t l-spine l5-s1 spot *]
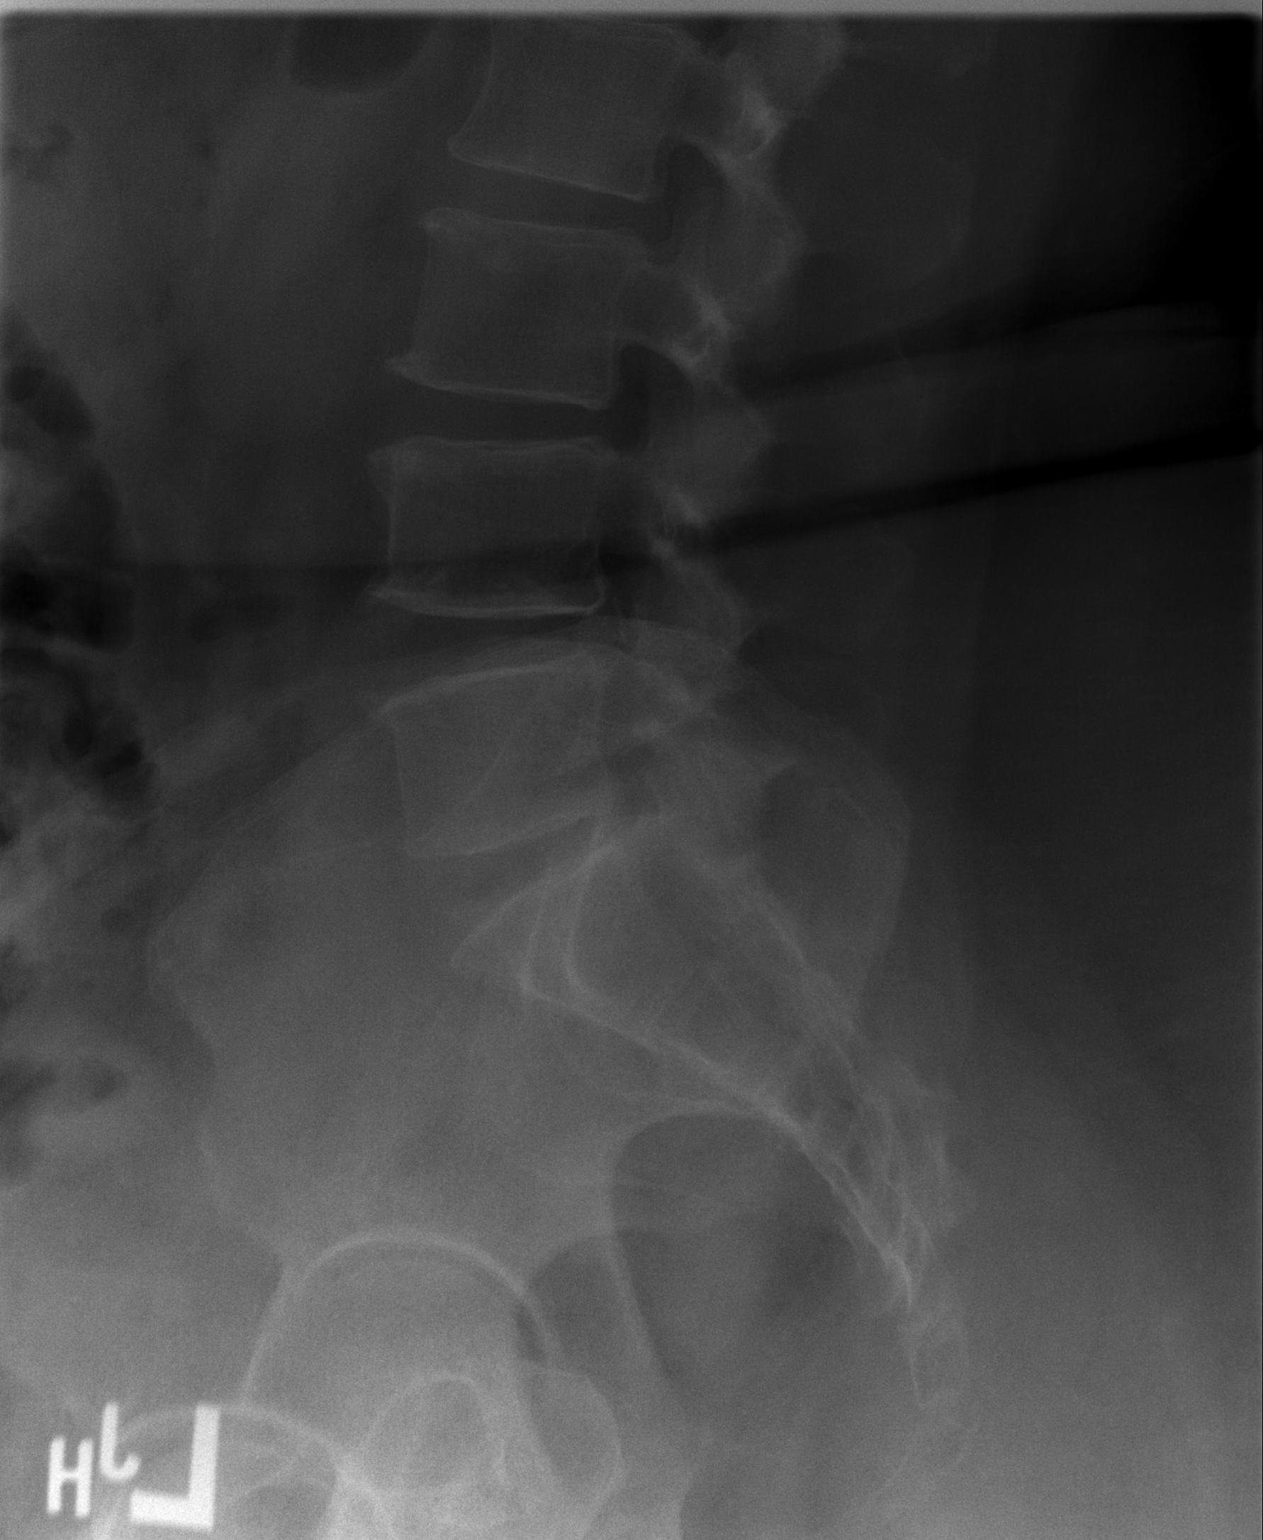

[3 of 3 positions shown; findings below may reference images not displayed]

FINDINGS: Alignment remains normal. No acute or suspicious osseous finding.
Mild degenerative spurring is again noted at the L2-3 through L4-5
levels, stable. No evidence of advanced degenerative change at any
level. Paravertebral soft tissues are unremarkable.
IMPRESSION: 1. Stable mild multilevel spondylosis.
2. No acute findings.

## 2019-09-04 IMAGING — CR DG CERVICAL SPINE 2 OR 3 VIEWS
4 series · 4 of 4 positions shown · non-contrast
Comparison: CT 04/24/2015.

CLINICAL DATA: Neck pain with radiation in the back and shoulders.

EXAM:
CERVICAL SPINE - 2-3 VIEW

[w c-spine lat *]
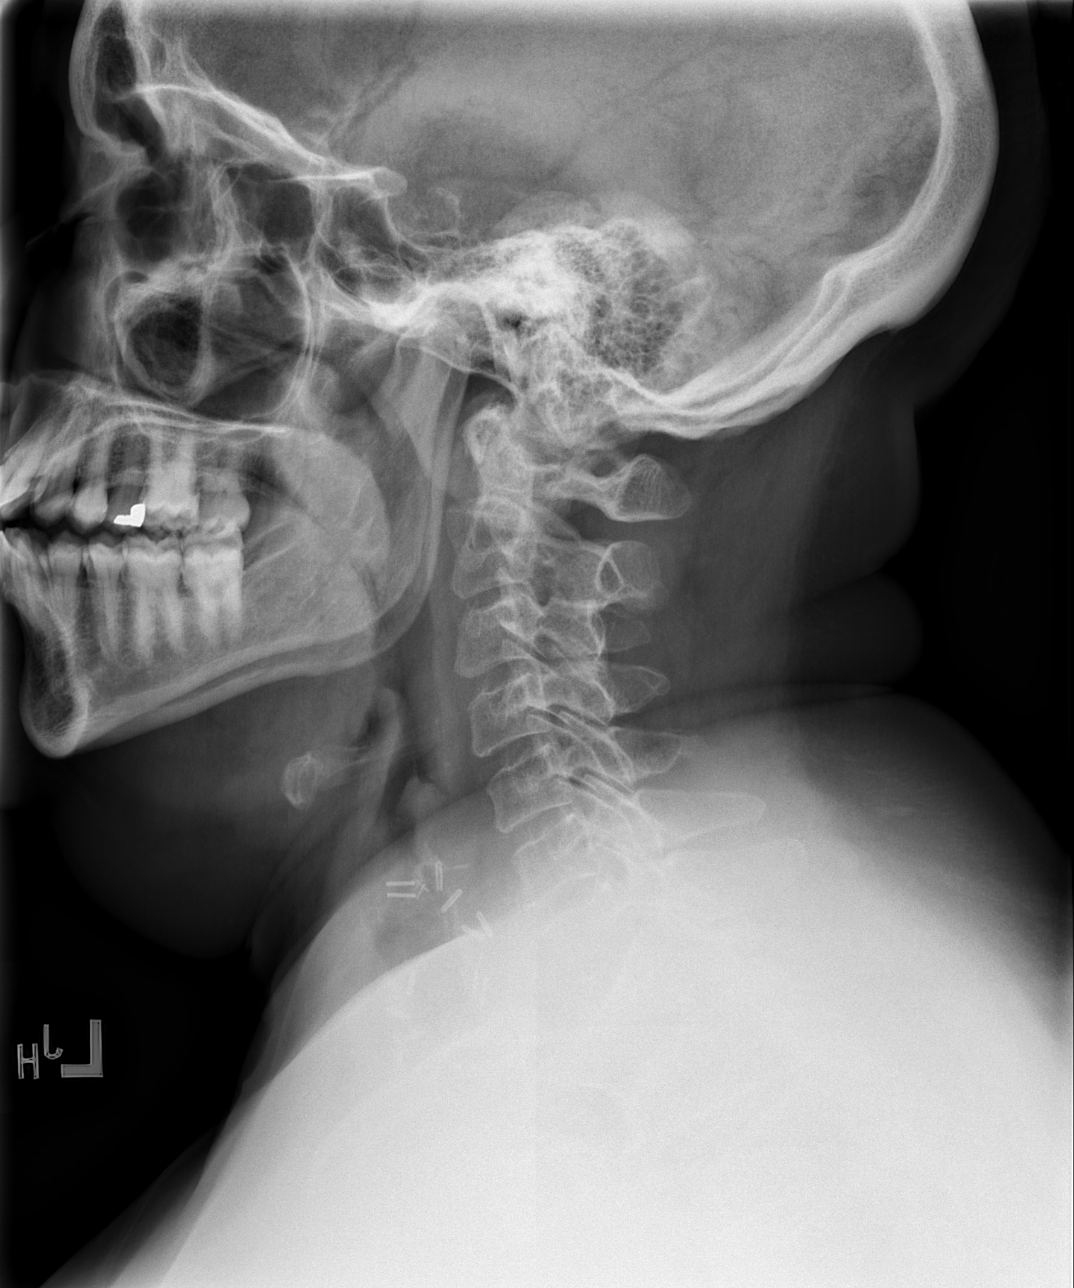

[w c-spine a.p. *]
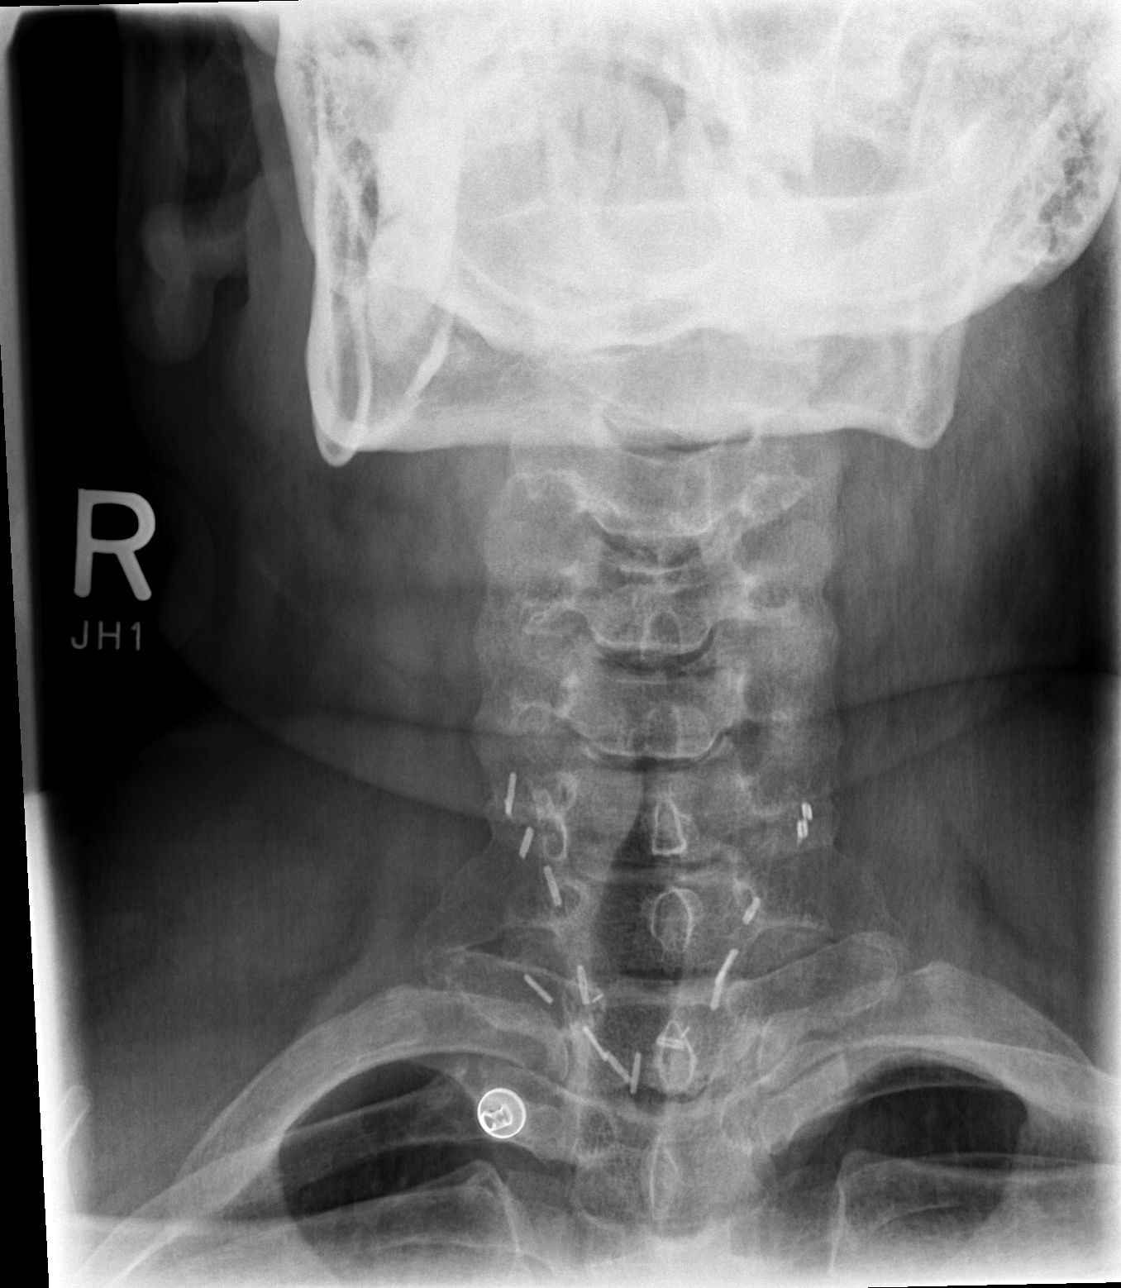

[w c-spine odontoid *]
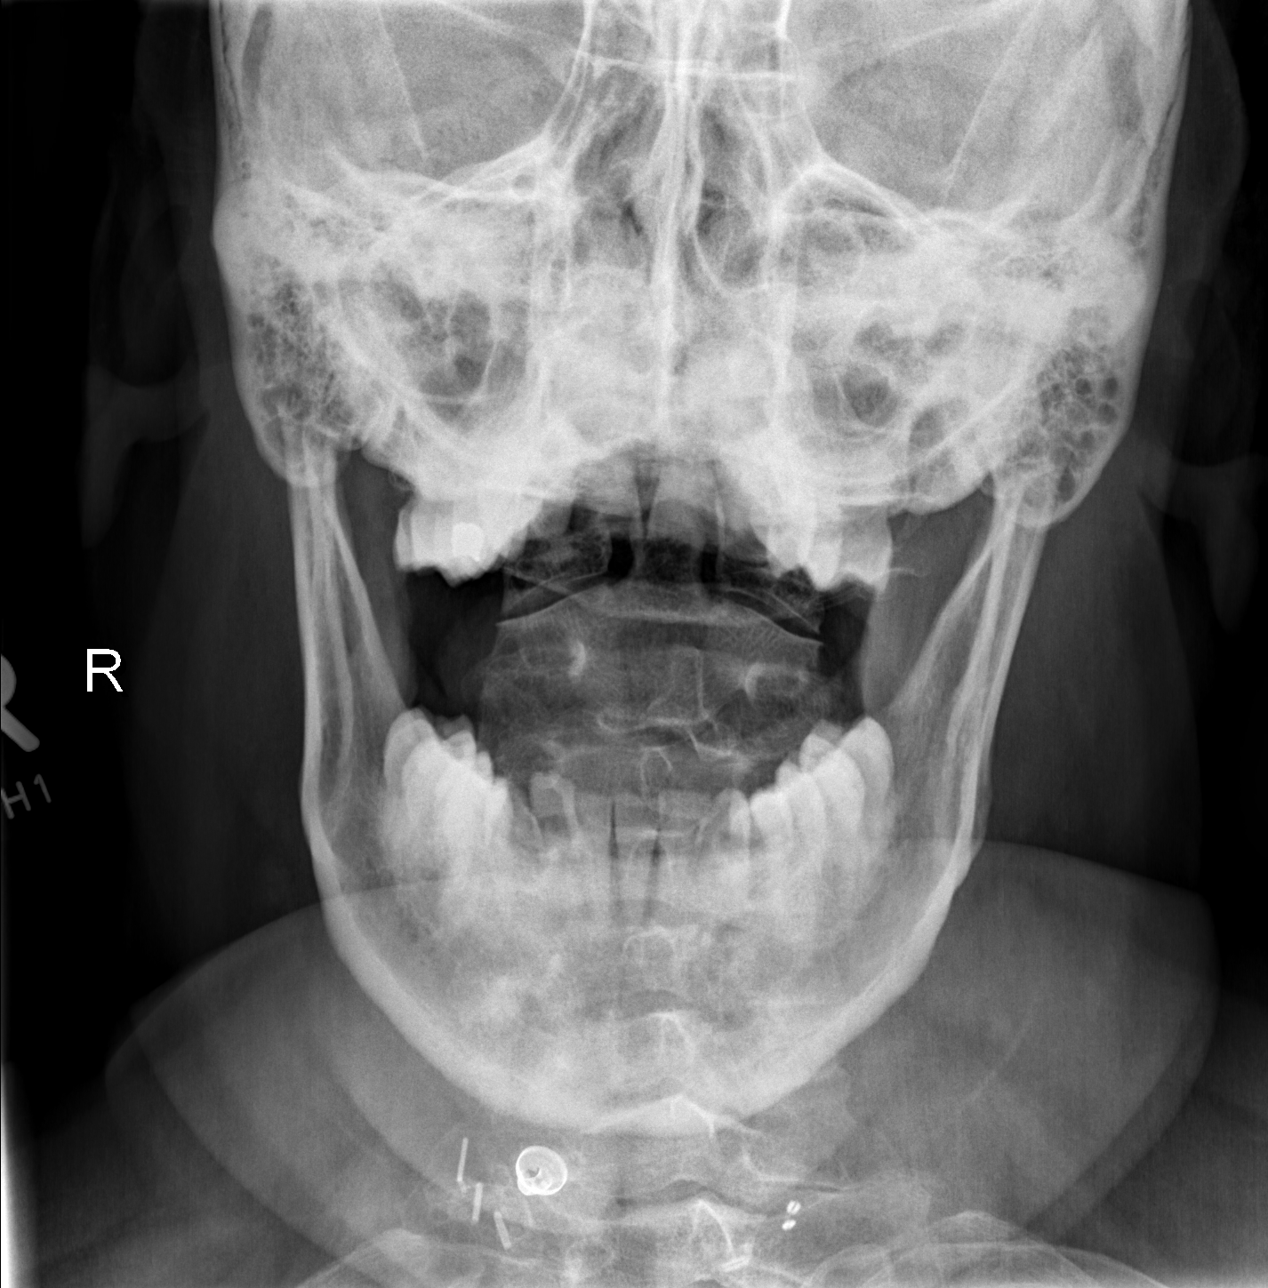

[w swimmers view *]
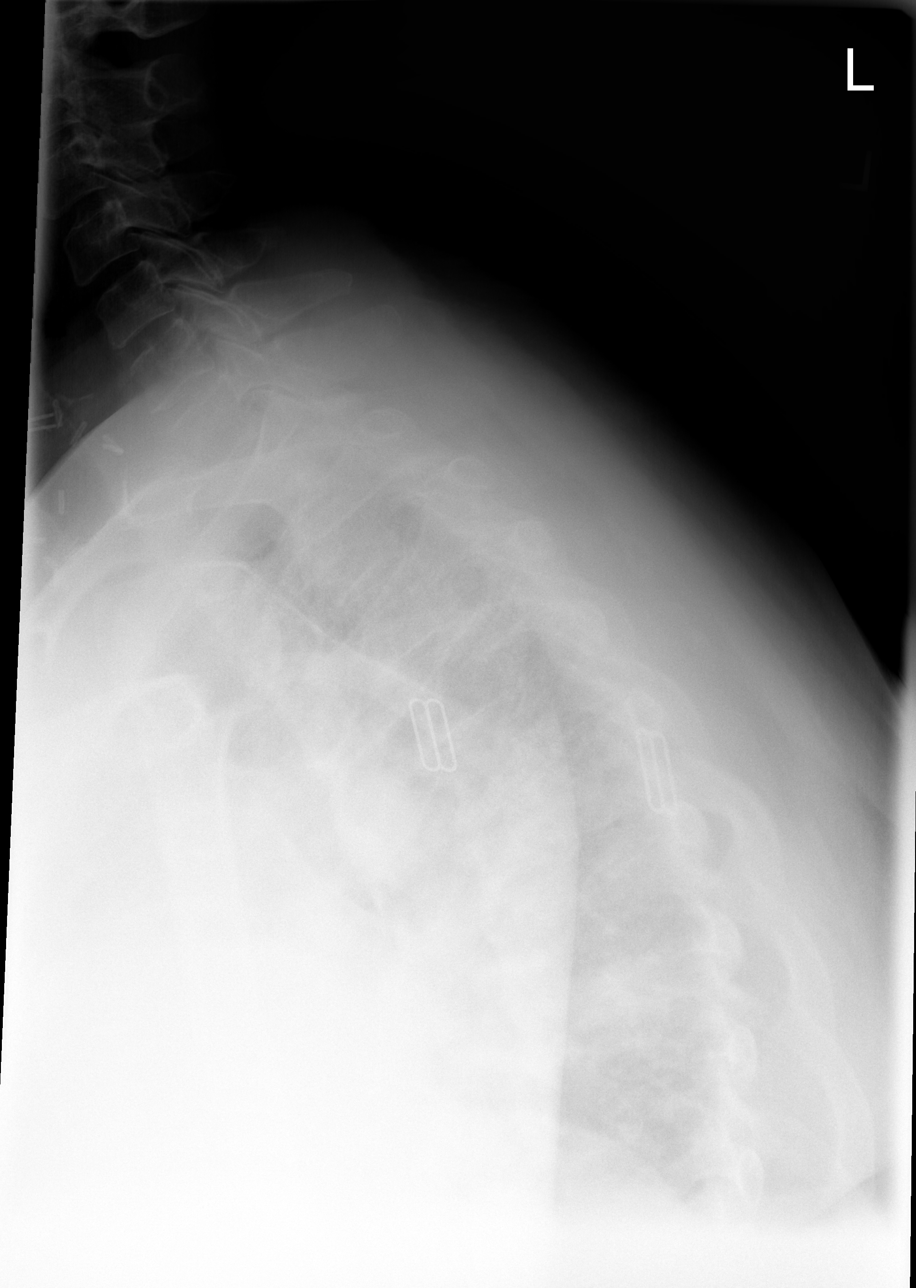

[4 of 4 positions shown; findings below may reference images not displayed]

FINDINGS: No acute bony abnormality identified. Normal alignment and
mineralization. Surgical clips in the lower neck. Pulmonary apices
are clear .
IMPRESSION: Diffuse degenerative change cervical spine. No acute bony
abnormality identified. No evidence of fracture.

## 2019-09-05 ENCOUNTER — Ambulatory Visit: Payer: Medicaid Other | Admitting: Primary Care

## 2019-09-12 ENCOUNTER — Ambulatory Visit (INDEPENDENT_AMBULATORY_CARE_PROVIDER_SITE_OTHER): Payer: Medicaid Other | Admitting: Adult Health

## 2019-09-12 ENCOUNTER — Other Ambulatory Visit: Payer: Self-pay

## 2019-09-12 ENCOUNTER — Encounter: Payer: Self-pay | Admitting: Adult Health

## 2019-09-12 VITALS — BP 130/78 | HR 86 | Temp 98.3°F | Ht 65.0 in | Wt 309.0 lb

## 2019-09-12 DIAGNOSIS — J9611 Chronic respiratory failure with hypoxia: Secondary | ICD-10-CM | POA: Diagnosis not present

## 2019-09-12 DIAGNOSIS — E662 Morbid (severe) obesity with alveolar hypoventilation: Secondary | ICD-10-CM

## 2019-09-12 DIAGNOSIS — G4733 Obstructive sleep apnea (adult) (pediatric): Secondary | ICD-10-CM

## 2019-09-12 DIAGNOSIS — I509 Heart failure, unspecified: Secondary | ICD-10-CM | POA: Diagnosis not present

## 2019-09-12 DIAGNOSIS — J9612 Chronic respiratory failure with hypercapnia: Secondary | ICD-10-CM

## 2019-09-12 IMAGING — CR DG CHEST 2V
2 series · 2 of 2 positions shown · non-contrast
Comparison: Radiographs October 02, 2016.

CLINICAL DATA: Shortness of breath.  Cough.

EXAM:
CHEST  2 VIEW

[w chest pa]
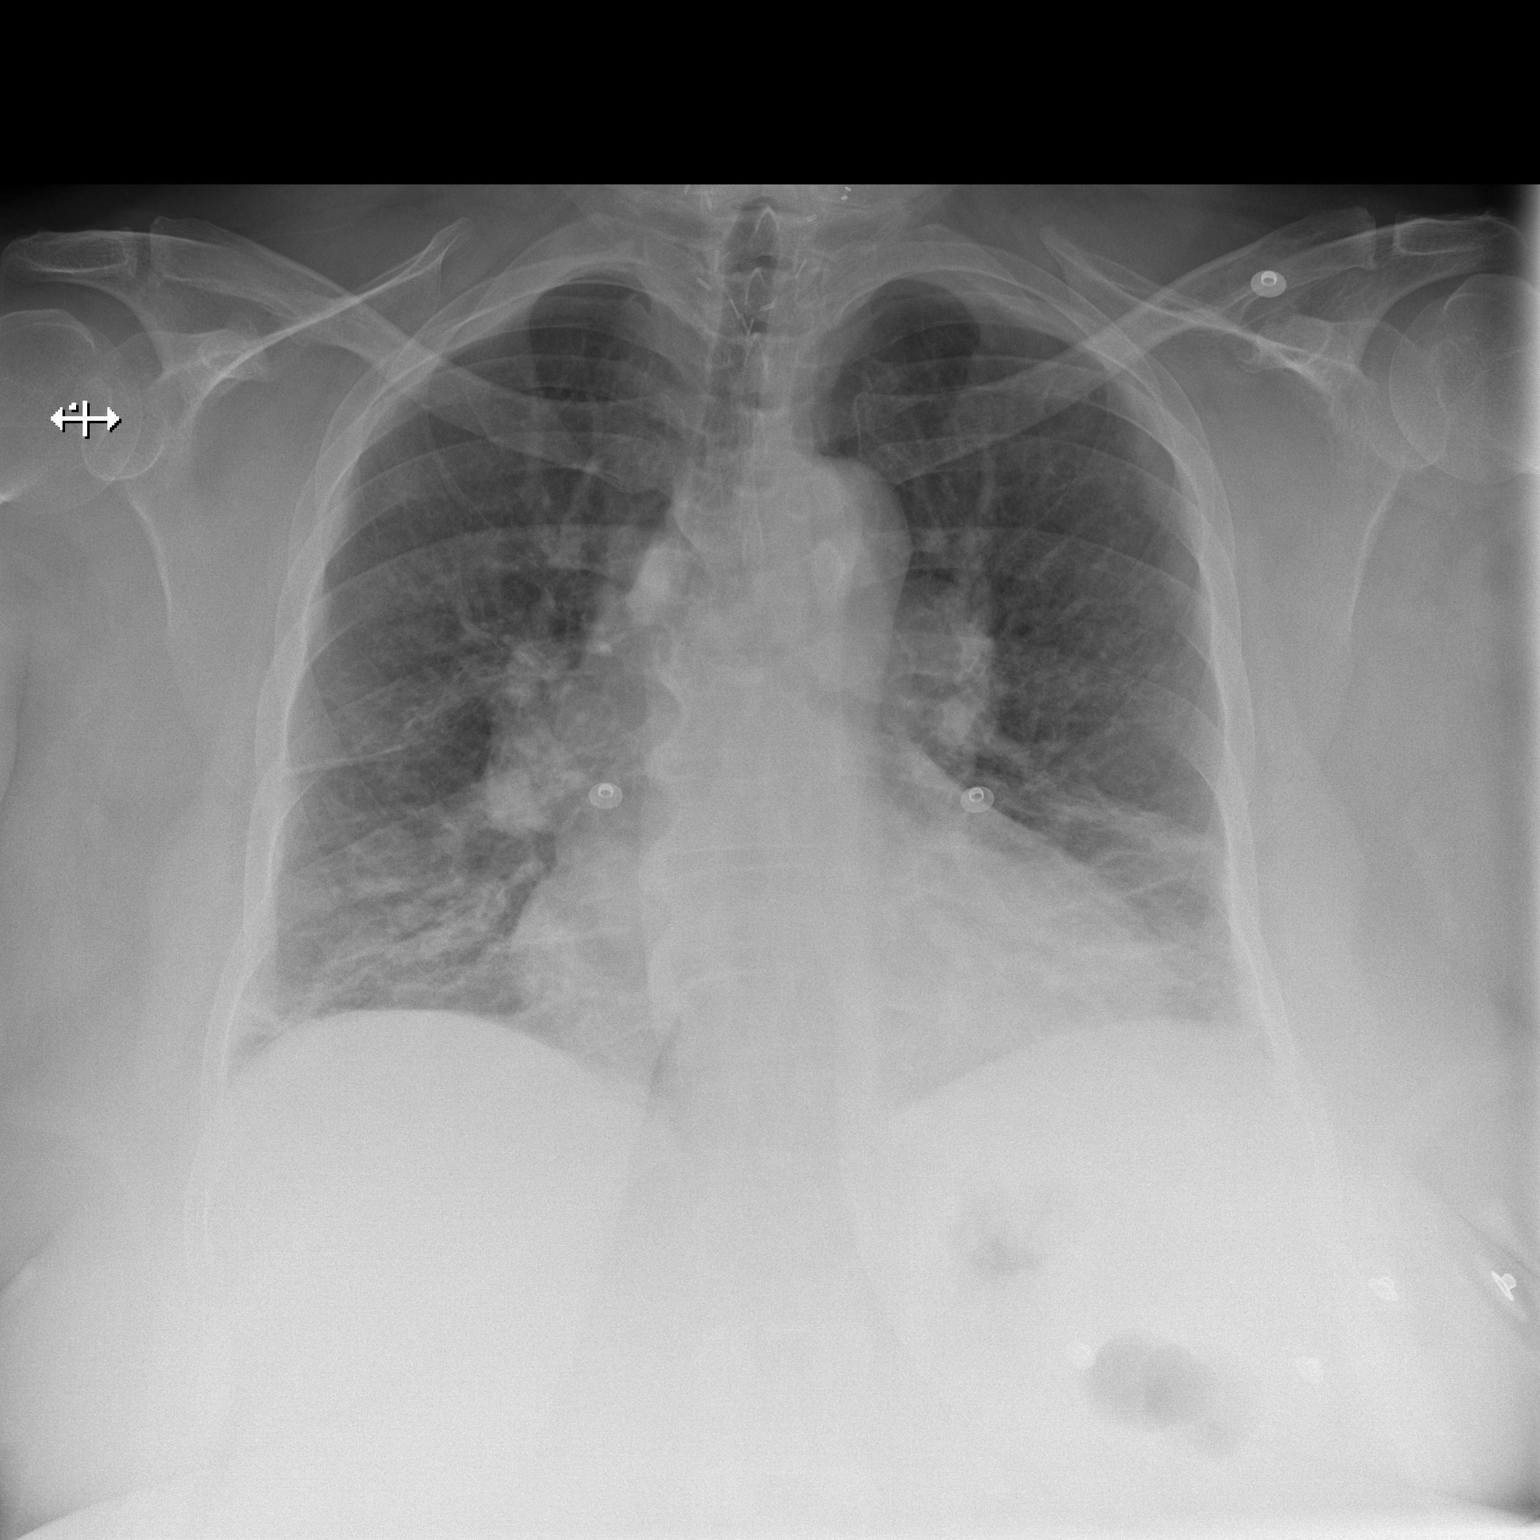

[w chest lat]
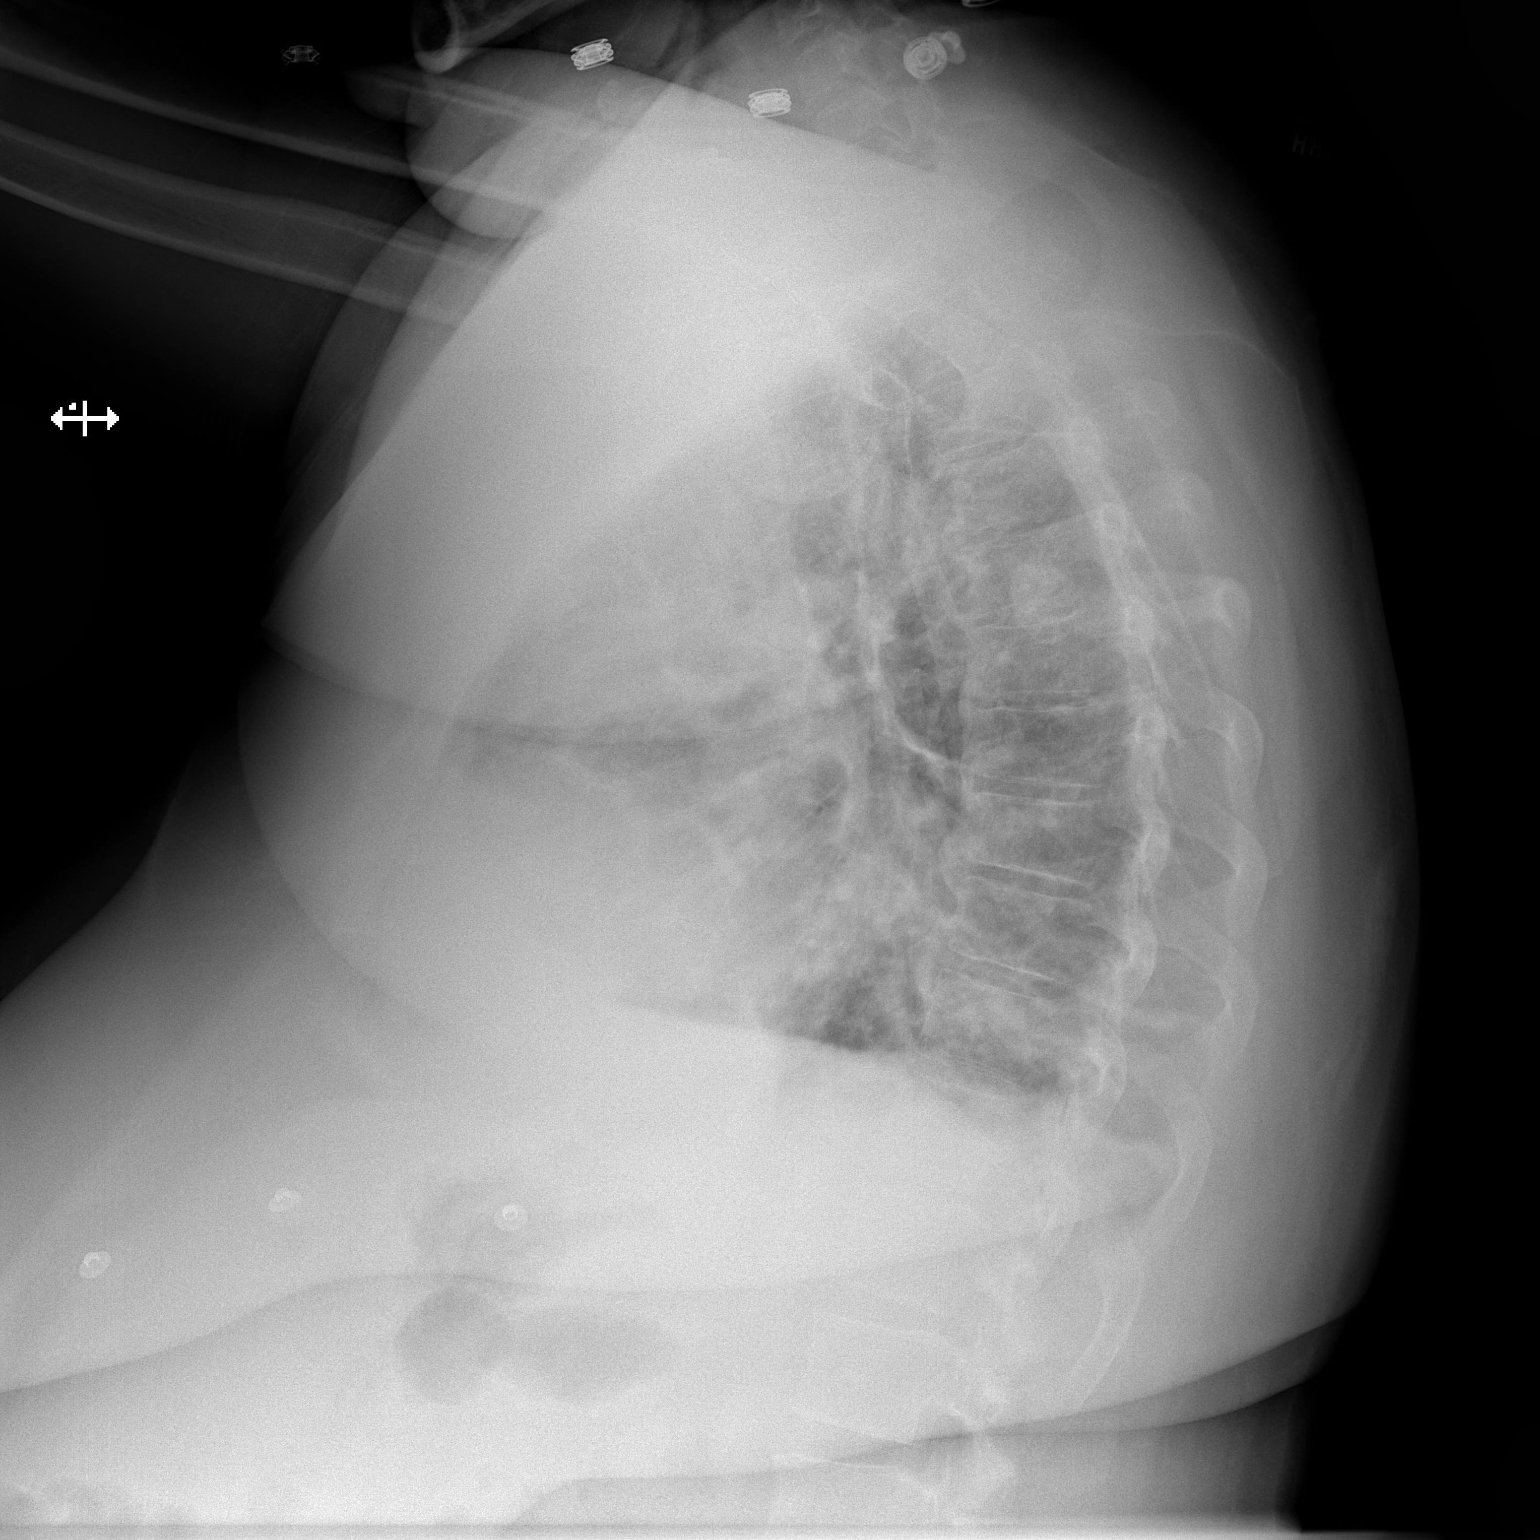

[2 of 2 positions shown; findings below may reference images not displayed]

FINDINGS: Stable cardiomegaly. No pneumothorax is noted. Mild bibasilar
subsegmental atelectasis or infiltrates are noted. No significant
pleural effusion is noted. Bony thorax is unremarkable.
IMPRESSION: Mild bibasilar subsegmental atelectasis or infiltrates.

## 2019-09-12 NOTE — Assessment & Plan Note (Signed)
Previous sleep study in 2014 did show positive sleep apnea.  However repeat sleep study 2017 was negative for sleep apnea.  Patient has multiple risk factors including morbid obesity, multiple comorbidities.  Along with daytime sleepiness, restless sleep and snoring.  We will repeat a split-night sleep study.

## 2019-09-12 NOTE — Assessment & Plan Note (Signed)
Patient has significant OHS with associated hypercarbic and hypoxic respiratory failure.  Long discussion regarding patient's need for ongoing weight loss.  Also needs to continue on oxygen.  We will check for a split-night sleep study to see if she has developed sleep apnea over the last few years.  If not could consider checking an ABG to see if she is still hypercarbic and then consider a bilevel device to see if this would help with her chronic hypercarbia and sleep issues.  Plan  Patient Instructions  Activity as tolerated.  Continue on Oxygen 2l/m  Set up for split night sleep study  Work on healthy weight .  Follow up with Dr. Halford Chessman in 2 months and As needed   Please contact office for sooner follow up if symptoms do not improve or worsen or seek emergency care

## 2019-09-12 NOTE — Progress Notes (Signed)
_0  ID: Michele Owens, female    DOB: 09/30/1960, 59 y.o.   MRN: [id number]  Chief Complaint  Patient presents with  . Follow-up    OHS     Referring provider: Everrett Coombe, MD  HPI: 59 yo female former smoker followed for obesity hypoventilation syndrome, hypercarbic and hypoxic respiratory failure , COPD-mainly with restrictive lung disease  TEST/EVENTS :  ABG from 12/07/14 >>pH 7.36, PaCO2 52.4, PaO2 66. PSG 08/24/15 >>AHI 3.6, SpO2 low 80%. Spent 13.6 min with SpO2 <88%. Needed 2 liters supplemental oxygen. Myocardial perfusion scan 06/03/15 >> partially reversible inferior lateral defect Echo 08/05/17 >> EF 60 to 65%, grade 1 DD, mild MR PFTs March 2017 showed moderate to severe airflow restriction.  FEV1 50%, ratio 84, FVC 47%, no significant bronchodilator response, DLCO 55%.   09/12/2019 Follow up : OHS, O2 RF  Patient presents for a follow-up visit.  She was last seen in the office in October 2019. Patient has known OHS and hypercarbic and hypoxic respiratory failure on oxygen 2 L.  Previous sleep study in May 2017 showed no significant sleep apnea but nocturnal hypoxemia.  Patient has been maintained on oxygen at 2 L. Previous ABGs have shown compensated hypercarbia..  Patient is prone to recurrent hospitalizations and emergency room visits. Patient has combined diastolic and systolic congestive heart failure.  She is monitored by the CHF clinic and is prone to volume overload.  She has been seen at the CHF clinic and requires frequent diuresis. Patient has been recently referred back to our office to evaluate for possible underlying sleep disorder her BMI is currently 51.  Patient does have daytime sleepiness.  Has morning headaches.  Positive snoring and restless sleep. Most recent ABG in April 2019 showed a pH of 7.30, PCO2 of 63 and a bicarb of 30. In the past her PCO2 has been as high as 85. Patient says she is sedentary has trouble getting around as she  gets short of breath with activities. She remains on Spiriva daily.   Allergies  Allergen Reactions  . Bee Venom Swelling and Other (See Comments)    "All over my body" (swelling)  . Fioricet [Butalbital-Apap-Caffeine] Nausea And Vomiting and Rash  . Ibuprofen Rash and Other (See Comments)    Severe rash  . Lamisil [Terbinafine] Rash and Other (See Comments)    Pt states this causes her to "feel funny"  . Nsaids Other (See Comments)    Per MD's orders     Immunization History  Administered Date(s) Administered  . Influenza Split 05/10/2012  . Influenza,inj,Quad PF,6+ Mos 03/13/2013, 12/08/2014  . PFIZER SARS-COV-2 Vaccination 08/07/2019, 09/01/2019  . Pneumococcal Polysaccharide-23 12/08/2014    Past Medical History:  Diagnosis Date  . Anxiety   . Arthritis   . Asthma   . Chronic diastolic CHF (congestive heart failure) ([place])   . COPD (chronic obstructive pulmonary disease) (H[facility])    Uses Oxygen at night  . Depression   . Diabetes mellitus without complication ([place])   . Dyspnea    occ  . Family history of thyroid cancer   . GERD (gastroesophageal reflux disease)   . Gout   . Headache    migraines  . History of DVT of lower extremity   . History of nuclear stress test    Myoview 2/17:  Low risk stress nuclear study with a small, moderate intensity, partially reversible inferior lateral defect consistent with small prior infarct and minimal peri-infarct ischemia; EF 68 with normal  wall motion  . Hypertension   . Pneumonia   . Thyroid cancer ([place]) 09/23/2016    Tobacco History: Social History   Tobacco Use  Smoking Status Former Smoker  . Packs/day: 0.50  . Years: 41.00  . Pack years: 20.50  . Types: Cigarettes  . Quit date: 07/2016  . Years since quitting: 3.1  Smokeless Tobacco Never Used  Tobacco Comment   Starting to [employer] Off -- using Nicotine Patch   Counseling given: Not Answered Comment: Starting to [employer] -- using Nicotine Patch   Outpatient  Medications Prior to Visit  Medication Sig Dispense Refill  . albuterol (PROVENTIL HFA;VENTOLIN HFA) 108 (90 Base) MCG/ACT inhaler Inhale 1-2 puffs into the lungs every 6 (six) hours as needed for wheezing or shortness of breath. 1 Inhaler 0  . allopurinol (ZYLOPRIM) 100 MG tablet Take 100 mg by mouth daily.    [name] ELLIPTA 62.5-25 MCG/INH AEPB Inhale 1 puff into the lungs daily.     . Blood Glucose Monitoring Suppl (ACCU-CHEK AVIVA PLUS) w/Device KIT 1 each by Other route daily.   0  . butalbital-acetaminophen-caffeine (FIORICET, ESGIC) 50-325-40 MG tablet Take 1-2 tablets by mouth every 4 (four) hours as needed for migraine.    . calcitRIOL (ROCALTROL) 0.5 MCG capsule Take 1 capsule (0.5 mcg total) by mouth daily. 30 capsule 0  . carvedilol (COREG) 6.25 MG tablet Take 1 tablet (6.25 mg total) by mouth 2 (two) times daily with a meal. 60 tablet 0  . cetirizine (ZYRTEC) 10 MG tablet Take 10 mg by mouth daily.  2  . clonazePAM (KLONOPIN) 2 MG tablet Take 1 tablet (2 mg total) by mouth 3 (three) times daily. for anxiety 10 tablet 0  . diclofenac sodium (VOLTAREN) 1 % GEL Apply 2 g topically 4 (four) times daily as needed (for pain). 1 Tube 0  . dicyclomine (BENTYL) 20 MG tablet Take 1 tablet (20 mg total) by mouth every 6 (six) hours as needed for spasms. 12 tablet 0  . EPINEPHrine 0.3 mg/0.3 mL IJ SOAJ injection Inject 0.3 mg into the muscle daily as needed (allergic reaction).    . FEROSUL 325 (65 Fe) MG tablet Take 325 mg by mouth daily.  3  . glucose blood test strip Use as instructed to check blood sugar up to 4 times daily (Patient taking differently: 1 each by Other route See admin instructions. Use as instructed to check blood sugar up to 4 times daily) 100 each 0  . guaiFENesin (MUCINEX) 600 MG 12 hr tablet Take 2 tablets (1,200 mg total) by mouth 2 (two) times daily. 60 tablet 0  . HYDROcodone-acetaminophen (NORCO/VICODIN) 5-325 MG tablet Take 1 tablet by mouth every 6 (six) hours as  needed for moderate pain.   0  . hydrOXYzine (ATARAX/VISTARIL) 25 MG tablet Take 1 tablet (25 mg total) by mouth every 6 (six) hours. 12 tablet 0  . ipratropium-albuterol (DUONEB) 0.5-2.5 (3) MG/3ML SOLN Take 3 mLs by nebulization 4 (four) times daily.   3  . levothyroxine (SYNTHROID, LEVOTHROID) 112 MCG tablet Take 224 mcg by mouth daily.  5  . lidocaine (LIDODERM) 5 % Place 1 patch onto the skin daily. Remove & Discard patch within 12 hours or as directed by MD 5 patch 0  . lisinopril (PRINIVIL,ZESTRIL) 5 MG tablet Take 1 tablet (5 mg total) by mouth daily. Please call for office visit [phone] 30 tablet 0  . loperamide (IMODIUM) 2 MG capsule Take 1 capsule (2 mg total)  by mouth 4 (four) times daily as needed for diarrhea or loose stools. 12 capsule 0  . metFORMIN (GLUCOPHAGE) 500 MG tablet Take 1 tablet (500 mg total) by mouth 2 (two) times daily with a meal. 30 tablet 1  . methocarbamol (ROBAXIN) 500 MG tablet Take 1 tablet (500 mg total) by mouth every 8 (eight) hours as needed for muscle spasms. 20 tablet 0  . metoCLOPramide (REGLAN) 10 MG tablet Take 1 tablet (10 mg total) by mouth every 6 (six) hours as needed for nausea (or headache). 30 tablet 0  . metoprolol succinate (TOPROL-XL) 25 MG 24 hr tablet Take 25 mg by mouth in the morning.     . montelukast (SINGULAIR) 10 MG tablet Take 1 tablet (10 mg total) by mouth at bedtime. 30 tablet 0  . nitroGLYCERIN (NITROSTAT) 0.4 MG SL tablet Place 1 tablet (0.4 mg total) under the tongue every 5 (five) minutes as needed for chest pain. 12 tablet 0  . nystatin cream (MYCOSTATIN) Apply 1 application topically 2 (two) times daily as needed for dry skin.   1  . omeprazole (PRILOSEC) 40 MG capsule Take 40 mg by mouth daily.  2  . OXYGEN Inhale 2 L into the lungs at bedtime.    . potassium chloride SA (K-DUR,KLOR-CON) 20 MEQ tablet Take 1 tablet (20 mEq total) by mouth 2 (two) times daily. 180 tablet 0  . pravastatin (PRAVACHOL) 40 MG tablet Take 40  mg by mouth every evening.   2  . predniSONE (DELTASONE) 20 MG tablet 3 tabs po day one, then 2 po daily x 4 days (Patient taking differently: Take 20 mg by mouth See admin instructions. 3 tabs po day one, then 2 po daily x 4 days) 11 tablet 0  . QC STOOL SOFTENER PLS LAXATIVE 8.6-50 MG tablet Take 1 tablet by mouth at bedtime.  2  . tiotropium (SPIRIVA) 18 MCG inhalation capsule Place 18 mcg into inhaler and inhale daily.    Marland Kitchen torsemide (DEMADEX) 20 MG tablet Take 3 tablets (60 mg total) by mouth daily. 90 tablet 3   No facility-administered medications prior to visit.     Review of Systems:   Constitutional:   No  weight loss, night sweats,  Fevers, chills, + fatigue, or  lassitude.  HEENT:   No headaches,  Difficulty swallowing,  Tooth/dental problems, or  Sore throat,                No sneezing, itching, ear ache, nasal congestion, post nasal drip,   CV:  No chest pain,  Orthopnea, PND, swelling in lower extremities, anasarca, dizziness, palpitations, syncope.   GI  No heartburn, indigestion, abdominal pain, nausea, vomiting, diarrhea, change in bowel habits, loss of appetite, bloody stools.   Resp:   No chest wall deformity  Skin: no rash or lesions.  GU: no dysuria, change in color of urine, no urgency or frequency.  No flank pain, no hematuria   MS:  No joint pain or swelling.  No decreased range of motion.  No back pain.    Physical Exam  BP 130/78 (BP Location: Left Arm, Cuff Size: Normal)   Pulse 86   Temp 98.3 F (36.8 C) (Oral)   Ht _0  (1.651 m)   Wt (!) 309 lb (140.2 kg)   SpO2 93%   BMI 51.42 kg/m   GEN: A/Ox3; pleasant , NAD, BMI 51   HEENT:  [place]/AT, NOSE-clear, THROAT-clear, no lesions, no postnasal drip or exudate noted.  NECK:  Supple w/ fair ROM; no JVD; normal carotid impulses w/o bruits; no thyromegaly or nodules palpated; no lymphadenopathy.    RESP  Clear  P & A; w/o, wheezes/ rales/ or rhonchi. no accessory muscle use, no dullness to  percussion  CARD:  RRR, no m/r/g, tr  peripheral edema, pulses intact, no cyanosis or clubbing.  GI:   Soft & nt; nml bowel sounds; no organomegaly or masses detected.   Musco: Warm bil, no deformities or joint swelling noted.   Neuro: alert, no focal deficits noted.    Skin: Warm, no lesions or rashes    Lab Results:  CBC  BMET  BNP  Imaging: ECHOCARDIOGRAM COMPLETE  Result Date: 09/02/2019    ECHOCARDIOGRAM REPORT   Patient Name:   FRANCESCA STROME Date of Exam: 09/02/2019 Medical Rec #:  [id number]     Height:       65.0 in Accession #:    [id number]    Weight:       311.0 lb Date of Birth:  1960/06/11    BSA:          2.386 m Patient Age:    71 years      BP:           112/76 mmHg Patient Gender: F             HR:           72 bpm. Exam Location:  Outpatient Procedure: 2D Echo Indications:    Congestive Heart Failure 428.0 / I50.9  History:        Patient has prior history of Echocardiogram examinations, most                 recent 07/09/2018. COPD; Risk Factors:Former Smoker, Diabetes and                 Hypertension. GERD. Thyroid cancer.  Sonographer:    Darlina Sicilian RDCS Referring Phys: [phone] AMY D CLEGG  Sonographer Comments: Technically difficult study due to poor echo windows. Image acquisition challenging due to patient body habitus. Definity contrast not given. Pulmonary pressures could not be estimated. IMPRESSIONS  1. Very technically difficult study. Grossly normal LVEF. Pulmonary pressures could not be estimated.  2. Left ventricular ejection fraction, by estimation, is 55 to 60%. The left ventricle has normal function. The left ventricle has no regional wall motion abnormalities. There is mild left ventricular hypertrophy. Left ventricular diastolic parameters are indeterminate.  3. Right ventricular systolic function is normal. The right ventricular size is mildly enlarged. Tricuspid regurgitation signal is inadequate for assessing PA pressure.  4. The mitral valve was not  well visualized. No evidence of mitral valve regurgitation.  5. The aortic valve was not well visualized. Aortic valve regurgitation is not visualized. Comparison(s): Unable to directly compare to prior study on 07/09/2018, however, LVEF at that time was 60-65%. FINDINGS  Left Ventricle: Left ventricular ejection fraction, by estimation, is 55 to 60%. The left ventricle has normal function. The left ventricle has no regional wall motion abnormalities. The left ventricular internal cavity size was normal in size. There is  mild left ventricular hypertrophy. Left ventricular diastolic parameters are indeterminate. Right Ventricle: The right ventricular size is mildly enlarged. No increase in right ventricular wall thickness. Right ventricular systolic function is normal. Tricuspid regurgitation signal is inadequate for assessing PA pressure. Left Atrium: Left atrial size was normal in size. Right Atrium: Right atrial size was normal in size. Pericardium: There is  no evidence of pericardial effusion. Mitral Valve: The mitral valve was not well visualized. No evidence of mitral valve regurgitation. Tricuspid Valve: The tricuspid valve is not well visualized. Tricuspid valve regurgitation is not demonstrated. Aortic Valve: The aortic valve was not well visualized. Aortic valve regurgitation is not visualized. Pulmonic Valve: The pulmonic valve was not well visualized. Pulmonic valve regurgitation is not visualized. Aorta: The aortic root is normal in size and structure. Pulmonary Artery: The pulmonary artery is not well seen. Venous: The inferior vena cava was not well visualized. IAS/Shunts: The interatrial septum was not well visualized.  LEFT VENTRICLE PLAX 2D LVOT diam:     2.10 cm LVOT Area:     3.46 cm  LEFT ATRIUM           Index LA Vol (A2C): 36.7 ml 15.38 ml/m   AORTA Ao Root diam: 2.60 cm MITRAL VALVE MV Area (PHT): 6.15 cm     SHUNTS MV Decel Time: 123 msec     Systemic Diam: 2.10 cm MV E velocity: 101.50  cm/s MV A velocity: 104.00 cm/s MV E/A ratio:  0.98 Lyman Bishop MD Electronically signed by Lyman Bishop MD Signature Date/Time: 09/02/2019/11:16:43 AM    Final       PFT Results Latest Ref Rng & Units 07/07/2015  FVC-Pre L 1.59  FVC-Predicted Pre % 52  FVC-Post L 1.43  FVC-Predicted Post % 47  Pre FEV1/FVC % % 73  Post FEV1/FCV % % 84  FEV1-Pre L 1.16  FEV1-Predicted Pre % 48  FEV1-Post L 1.20  DLCO UNC% % 55  DLCO COR %Predicted % 102  TLC L 3.06  TLC % Predicted % 57  RV % Predicted % 56    No results found for: NITRICOXIDE      Assessment & Plan:   Obesity hypoventilation syndrome ([place]) Patient has significant OHS with associated hypercarbic and hypoxic respiratory failure.  Long discussion regarding patient's need for ongoing weight loss.  Also needs to continue on oxygen.  We will check for a split-night sleep study to see if she has developed sleep apnea over the last few years.  If not could consider checking an ABG to see if she is still hypercarbic and then consider a bilevel device to see if this would help with her chronic hypercarbia and sleep issues.  Plan  Patient Instructions  Activity as tolerated.  Continue on Oxygen 2l/m  Set up for split night sleep study  Work on healthy weight .  Follow up with Dr. Halford Chessman in 2 months and As needed   Please contact office for sooner follow up if symptoms do not improve or worsen or seek emergency care       OSA (obstructive sleep apnea) Previous sleep study in 2014 did show positive sleep apnea.  However repeat sleep study 2017 was negative for sleep apnea.  Patient has multiple risk factors including morbid obesity, multiple comorbidities.  Along with daytime sleepiness, restless sleep and snoring.  We will repeat a split-night sleep study.  CHF (congestive heart failure) (HCC) Systolic and diastolic heart failure.  Patient is continue follow-up with cardiology.  Continue low-sodium diet.  Continue on  oxygen.  Chronic respiratory failure with hypoxia and hypercapnia ([facility]) Continue on oxygen at 2 L to keep O2 saturation goals greater than 90%.     Rexene Edison, NP 09/12/2019

## 2019-09-12 NOTE — Patient Instructions (Signed)
Activity as tolerated.  Continue on Oxygen 2l/m  Set up for split night sleep study  Work on healthy weight .  Follow up with Dr. Halford Chessman in 2 months and As needed   Please contact office for sooner follow up if symptoms do not improve or worsen or seek emergency care

## 2019-09-12 NOTE — Progress Notes (Signed)
Reviewed and agree with assessment/plan.   Chesley Mires, MD [facility] Pulmonary/Critical Care 09/12/2019, 1:08 PM Pager:  [phone]

## 2019-09-12 NOTE — Assessment & Plan Note (Signed)
Systolic and diastolic heart failure.  Patient is continue follow-up with cardiology.  Continue low-sodium diet.  Continue on oxygen.

## 2019-09-12 NOTE — Assessment & Plan Note (Signed)
Continue on oxygen at 2 L to keep O2 saturation goals greater than 90%.

## 2019-09-26 ENCOUNTER — Inpatient Hospital Stay (HOSPITAL_COMMUNITY)
Admission: RE | Admit: 2019-09-26 | Discharge: 2019-09-26 | Disposition: A | Discharge status: Home / Self Care | Payer: Medicaid Other | Source: Ambulatory Visit

## 2019-09-26 NOTE — Progress Notes (Signed)
Pt arrived to the drive-thru for covid testing via [employer] services. Pt was to be dropped off and pick back-up at 1300. Pt arrived for covid testing at 1115. Pt is not able to enter the building d/t security restrictions and is not well enough to sit outside in the heat for 2 hours. Driver was not willing to wait as he had other routes and was instructed to strictly drop the pt off. Testing was not completed. Terry at the [facility] notified that testing was not completed under these circumstances. A supervisor will be notified by Coralyn Mark.  Pt understood why testing was not completed and complied.   Jacqlyn Larsen, RN

## 2019-09-29 ENCOUNTER — Other Ambulatory Visit: Payer: Self-pay

## 2019-09-29 ENCOUNTER — Ambulatory Visit (HOSPITAL_BASED_OUTPATIENT_CLINIC_OR_DEPARTMENT_OTHER): Payer: Medicaid Other | Attending: Adult Health | Admitting: Pulmonary Disease

## 2019-09-29 DIAGNOSIS — G4733 Obstructive sleep apnea (adult) (pediatric): Secondary | ICD-10-CM | POA: Diagnosis not present

## 2019-09-29 DIAGNOSIS — R0902 Hypoxemia: Secondary | ICD-10-CM | POA: Diagnosis not present

## 2019-09-29 DIAGNOSIS — J449 Chronic obstructive pulmonary disease, unspecified: Secondary | ICD-10-CM | POA: Diagnosis not present

## 2019-09-29 DIAGNOSIS — Z79899 Other long term (current) drug therapy: Secondary | ICD-10-CM | POA: Diagnosis not present

## 2019-09-29 DIAGNOSIS — G4734 Idiopathic sleep related nonobstructive alveolar hypoventilation: Secondary | ICD-10-CM

## 2019-09-30 ENCOUNTER — Ambulatory Visit (HOSPITAL_COMMUNITY)
Admission: RE | Admit: 2019-09-30 | Discharge: 2019-09-30 | Disposition: A | Discharge status: Home / Self Care | Payer: Medicaid Other | Source: Ambulatory Visit | Attending: Cardiology | Admitting: Cardiology

## 2019-09-30 ENCOUNTER — Encounter (HOSPITAL_COMMUNITY): Payer: Self-pay

## 2019-09-30 ENCOUNTER — Other Ambulatory Visit (HOSPITAL_COMMUNITY): Payer: Self-pay | Admitting: *Deleted

## 2019-09-30 VITALS — BP 108/72 | HR 102 | Wt 307.0 lb

## 2019-09-30 DIAGNOSIS — E119 Type 2 diabetes mellitus without complications: Secondary | ICD-10-CM | POA: Diagnosis not present

## 2019-09-30 DIAGNOSIS — F419 Anxiety disorder, unspecified: Secondary | ICD-10-CM | POA: Diagnosis not present

## 2019-09-30 DIAGNOSIS — Z833 Family history of diabetes mellitus: Secondary | ICD-10-CM | POA: Diagnosis not present

## 2019-09-30 DIAGNOSIS — I5033 Acute on chronic diastolic (congestive) heart failure: Secondary | ICD-10-CM | POA: Insufficient documentation

## 2019-09-30 DIAGNOSIS — K219 Gastro-esophageal reflux disease without esophagitis: Secondary | ICD-10-CM | POA: Diagnosis not present

## 2019-09-30 DIAGNOSIS — Z86718 Personal history of other venous thrombosis and embolism: Secondary | ICD-10-CM | POA: Insufficient documentation

## 2019-09-30 DIAGNOSIS — I110 Hypertensive heart disease with heart failure: Secondary | ICD-10-CM | POA: Diagnosis not present

## 2019-09-30 DIAGNOSIS — G4733 Obstructive sleep apnea (adult) (pediatric): Secondary | ICD-10-CM | POA: Insufficient documentation

## 2019-09-30 DIAGNOSIS — M109 Gout, unspecified: Secondary | ICD-10-CM | POA: Diagnosis not present

## 2019-09-30 DIAGNOSIS — I471 Supraventricular tachycardia: Secondary | ICD-10-CM | POA: Diagnosis not present

## 2019-09-30 DIAGNOSIS — Z6841 Body Mass Index (BMI) 40.0 and over, adult: Secondary | ICD-10-CM | POA: Diagnosis not present

## 2019-09-30 DIAGNOSIS — J449 Chronic obstructive pulmonary disease, unspecified: Secondary | ICD-10-CM | POA: Insufficient documentation

## 2019-09-30 DIAGNOSIS — E785 Hyperlipidemia, unspecified: Secondary | ICD-10-CM | POA: Insufficient documentation

## 2019-09-30 DIAGNOSIS — Z9981 Dependence on supplemental oxygen: Secondary | ICD-10-CM | POA: Insufficient documentation

## 2019-09-30 DIAGNOSIS — Z85850 Personal history of malignant neoplasm of thyroid: Secondary | ICD-10-CM | POA: Diagnosis not present

## 2019-09-30 DIAGNOSIS — Z7952 Long term (current) use of systemic steroids: Secondary | ICD-10-CM | POA: Insufficient documentation

## 2019-09-30 DIAGNOSIS — Z79899 Other long term (current) drug therapy: Secondary | ICD-10-CM | POA: Diagnosis not present

## 2019-09-30 DIAGNOSIS — E042 Nontoxic multinodular goiter: Secondary | ICD-10-CM | POA: Insufficient documentation

## 2019-09-30 DIAGNOSIS — M1990 Unspecified osteoarthritis, unspecified site: Secondary | ICD-10-CM | POA: Insufficient documentation

## 2019-09-30 DIAGNOSIS — Z791 Long term (current) use of non-steroidal anti-inflammatories (NSAID): Secondary | ICD-10-CM | POA: Diagnosis not present

## 2019-09-30 DIAGNOSIS — I5032 Chronic diastolic (congestive) heart failure: Secondary | ICD-10-CM | POA: Diagnosis not present

## 2019-09-30 DIAGNOSIS — Z87891 Personal history of nicotine dependence: Secondary | ICD-10-CM | POA: Diagnosis not present

## 2019-09-30 DIAGNOSIS — Z7984 Long term (current) use of oral hypoglycemic drugs: Secondary | ICD-10-CM | POA: Insufficient documentation

## 2019-09-30 LAB — BASIC METABOLIC PANEL
Anion gap: 14 (ref 5–15)
BUN: 11 mg/dL (ref 6–20)
CO2: 30 mmol/L (ref 22–32)
Calcium: 9.2 mg/dL (ref 8.9–10.3)
Chloride: 94 mmol/L — ABNORMAL LOW (ref 98–111)
Creatinine, Ser: 0.68 mg/dL (ref 0.44–1.00)
GFR calc Af Amer: >60 mL/min (ref >60)
GFR calc non Af Amer: >60 mL/min (ref >60)
Glucose, Bld: 120 mg/dL — ABNORMAL HIGH (ref 70–99)
Potassium: 3.7 mmol/L (ref 3.5–5.1)
Sodium: 138 mmol/L (ref 135–145)

## 2019-09-30 LAB — BRAIN NATRIURETIC PEPTIDE: B Natriuretic Peptide: 39.1 pg/mL (ref 0.0–100.0)

## 2019-09-30 MED ORDER — FUROSEMIDE 10 MG/ML IJ SOLN
40.0000 mg | Freq: Once | INTRAMUSCULAR | Status: AC
  Administered 2019-09-30: 40 mg via INTRAVENOUS
  Filled 2019-09-30 (×3): qty 4

## 2019-09-30 MED ORDER — SODIUM CHLORIDE 0.9% FLUSH
3.0000 mL | Freq: Two times a day (BID) | INTRAVENOUS | Status: DC
Start: 2019-09-30 — End: 2019-09-30

## 2019-09-30 MED ORDER — POTASSIUM CHLORIDE CRYS ER 20 MEQ PO TBCR
20.0000 meq | EXTENDED_RELEASE_TABLET | Freq: Once | ORAL | Status: AC
  Administered 2019-09-30: 20 meq via ORAL
  Filled 2019-09-30: qty 1

## 2019-09-30 NOTE — Patient Instructions (Signed)
° ° °  [facility] [place] [id number] [place] [address] Dept: [phone] Loc: [phone]  Michele Owens  09/30/2019  You are scheduled for a Cardiac Catheterization on Wednesday, June 23 with Dr. Loralie Champagne.  1. Please arrive at the [facility] (Main Entrance A) at [facility]: [address] [place], [place] [address] at 12:00 PM (This time is two hours before your procedure to ensure your preparation). Free valet parking service is available.   Special note: Every effort is made to have your procedure done on time. Please understand that emergencies sometimes delay scheduled procedures.  2. Diet: Do not eat solid foods after midnight.  The patient may have clear liquids until 5am upon the day of the procedure.  3. Labs: pre procedure labs done 09/30/2019  4. Medication instructions in preparation for your procedure:   Contrast Allergy: No   Stop taking Torsemide, Wednesday October 01, 2019.   Do not take Diabetes Med Glucophage (Metformin) on the day of the procedure and HOLD 48 HOURS AFTER THE PROCEDURE.  On the morning of your procedure, take your Aspirin and any morning medicines NOT listed above.  You may use sips of water.  5. Plan for one night stay--bring personal belongings. 6. Bring a current list of your medications and current insurance cards. 7. You MUST have a responsible person to drive you home. 8. Someone MUST be with you the first 24 hours after you arrive home or your discharge will be delayed. 9. Please wear clothes that are easy to get on and off and wear slip-on shoes.  Thank you for allowing [place] to care for you!   -- [facility]

## 2019-09-30 NOTE — H&P (View-Only) (Signed)
PCP: Dr Jonelle Sidle Primary Cardiologist: Dr Aundra Dubin   Reason for Visit: F/u for Chronic Diastolic Heart Failure   HPI: Michele Owens is a 59 y.o. female with a history of HTN, obesity, OSA, COPD, hyperlipidemia, diastolic CHF,and SVT.   She was admitted in 0/03 with diastolic CHF and COPD exacerbation. She was admitted in 1/17 with COPD exacerbation. She was set up for a Cardiolite in 2/17. This showed a small, moderate intensity partially reversible inferolateral defect. LHC/RHC in 3/17 showed no significant CAD, mildly elevated PCWP. PFTs in 3/17 showed severe COPD.   Admitted 10/15-10/19/17 with AECOPD and A/CdiastolicCHF. Diuresed with IV lasix. Completed course for CAP. Pt also noted have multinodular goiter on [place]. 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   Echo in 4/18 showed EF 45-50%, moderate dilated RV with moderately decreased systolic function.   She was admitted again in 4/18 with COPD exacerbation, acute on chronic diastolic CHF, and CAP.  She was admitted in 3/20 with acute on chronic diastolic CHF and treated with IV Lasix. Echo showed EF 60-65%, mild RV enlargement with normal function, respirophasic septal variation.  She has been in the ED 07/11/19, 07/08/19, 07/05/19, 06/28/19, 06/25/19, 06/23/19 for abdominal pain/chest pain. CT abdomen was stable. Given IV lasix on a couple occasions.   Had f/u in April and was felt to be mildly volume overloaded w/ poor response to PO Lasix. Diuretic was changed to torsemide 60 mg daily. Referral was also placed to pulmonology.   She had return f/u 5/21. Noted poor response to torsemide. Wt was trending up. She also endorsed increased abdominal distention and LEE. Repeat 2D echo showed normal LVEF 55-60% (lower than prior study, 60-65%). RV normal. RVSP was normal. [facility] w/ instructions to treat w/ 80 mg IV x 3 days, then return to torsemide 60 mg daily.    Returns today for f/u. Her wt is down 12 lb since last visit but she continues to complain of dyspnea and feels that she is "carrying fluid". On chronic home O2. No dyspnea at rest.    2D Echo 09/02/19   1. Very technically difficult study. Grossly normal LVEF. Pulmonary pressures could not be estimated. 2. Left ventricular ejection fraction, by estimation, is 55 to 60%. The left ventricle has normal function. The left ventricle has no regional wall motion abnormalities. There is mild left ventricular hypertrophy. Left ventricular diastolic parameters are indeterminate. 3. Right ventricular systolic function is normal. The right ventricular size is mildly enlarged. Tricuspid regurgitation signal is inadequate for assessing PA pressure. 4. The mitral valve was not well visualized. No evidence of mitral valve regurgitation. 5. The aortic valve was not well visualized. Aortic valve regurgitation is not visualized.  ROS: All systems negative except as listed in HPI, PMH and Problem List.  SH:  Social History   Socioeconomic History  . Marital status: Single    Spouse name: Not on file  . Number of children: Not on file  . Years of education: Not on file  . Highest education level: Not on file  Occupational History  . Occupation: unemployeed  Tobacco Use  . Smoking status: Former Smoker    Packs/day: 0.50    Years: 41.00    Pack years: 20.50    Types: Cigarettes    Quit date: 07/2016    Years since quitting: 3.2  . Smokeless tobacco: Never Used  . Tobacco comment: Starting to Wean Off -- using Nicotine  Patch  Vaping Use  . Vaping Use: Never used  Substance and Sexual Activity  . Alcohol use: Not Currently    Alcohol/week: 0.0 standard drinks    Comment: ? none now  . Drug use: Never    Comment: none for long time  . Sexual activity: Yes  Other Topics Concern  . Not on file  Social History Narrative  . Not on file   Social Determinants of Health   Financial Resource  Strain:   . Difficulty of Paying Living Expenses:   Food Insecurity:   . Worried About Charity fundraiser in the Last Year:   . Arboriculturist in the Last Year:   Transportation Needs:   . Film/video editor (Medical):   Marland Kitchen Lack of Transportation (Non-Medical):   Physical Activity:   . Days of Exercise per Week:   . Minutes of Exercise per Session:   Stress:   . Feeling of Stress :   Social Connections:   . Frequency of Communication with Friends and Family:   . Frequency of Social Gatherings with Friends and Family:   . Attends Religious Services:   . Active Member of Clubs or Organizations:   . Attends Archivist Meetings:   Marland Kitchen Marital Status:   Intimate Partner Violence:   . Fear of Current or Ex-Partner:   . Emotionally Abused:   Marland Kitchen Physically Abused:   . Sexually Abused:     FH:  Family History  Problem Relation Age of Onset  . Cancer Father        thought to be due to exposure to concrete  . Diabetes Mother   . Thyroid cancer Mother        dx in her 23s-60s  . Cancer Maternal Uncle        2 uncles with cancer NOS  . Brain cancer Paternal Aunt   . Cancer Cousin        maternal first cousin - NOS  . Cancer Cousin        maternal first cousin - NOS    Past Medical History:  Diagnosis Date  . Anxiety   . Arthritis   . Asthma   . Chronic diastolic CHF (congestive heart failure) ([place])   . COPD (chronic obstructive pulmonary disease) ([facility])    Uses Oxygen at night  . Depression   . Diabetes mellitus without complication ([place])   . Dyspnea    occ  . Family history of thyroid cancer   . GERD (gastroesophageal reflux disease)   . Gout   . Headache    migraines  . History of DVT of lower extremity   . History of nuclear stress test    Myoview 2/17:  Low risk stress nuclear study with a small, moderate intensity, partially reversible inferior lateral defect consistent with small prior infarct and minimal peri-infarct ischemia; EF 68 with normal wall  motion  . Hypertension   . Pneumonia   . Thyroid cancer ([place]) 09/23/2016    Current Outpatient Medications  Medication Sig Dispense Refill  . albuterol (PROVENTIL HFA;VENTOLIN HFA) 108 (90 Base) MCG/ACT inhaler Inhale 1-2 puffs into the lungs every 6 (six) hours as needed for wheezing or shortness of breath. 1 Inhaler 0  . allopurinol (ZYLOPRIM) 100 MG tablet Take 100 mg by mouth daily.    [name] ELLIPTA 62.5-25 MCG/INH AEPB Inhale 1 puff into the lungs daily.     . Blood Glucose Monitoring Suppl (ACCU-CHEK AVIVA PLUS)  w/Device KIT 1 each by Other route daily.   0  . butalbital-acetaminophen-caffeine (FIORICET, ESGIC) 50-325-40 MG tablet Take 1-2 tablets by mouth every 4 (four) hours as needed for migraine.    . calcitRIOL (ROCALTROL) 0.5 MCG capsule Take 1 capsule (0.5 mcg total) by mouth daily. 30 capsule 0  . carvedilol (COREG) 6.25 MG tablet Take 1 tablet (6.25 mg total) by mouth 2 (two) times daily with a meal. 60 tablet 0  . cetirizine (ZYRTEC) 10 MG tablet Take 10 mg by mouth daily.  2  . clonazePAM (KLONOPIN) 2 MG tablet Take 1 tablet (2 mg total) by mouth 3 (three) times daily. for anxiety 10 tablet 0  . diclofenac sodium (VOLTAREN) 1 % GEL Apply 2 g topically 4 (four) times daily as needed (for pain). 1 Tube 0  . dicyclomine (BENTYL) 20 MG tablet Take 1 tablet (20 mg total) by mouth every 6 (six) hours as needed for spasms. 12 tablet 0  . EPINEPHrine 0.3 mg/0.3 mL IJ SOAJ injection Inject 0.3 mg into the muscle daily as needed (allergic reaction).    . FEROSUL 325 (65 Fe) MG tablet Take 325 mg by mouth daily.  3  . glucose blood test strip Use as instructed to check blood sugar up to 4 times daily (Patient taking differently: 1 each by Other route See admin instructions. Use as instructed to check blood sugar up to 4 times daily) 100 each 0  . guaiFENesin (MUCINEX) 600 MG 12 hr tablet Take 2 tablets (1,200 mg total) by mouth 2 (two) times daily. 60 tablet 0  .  HYDROcodone-acetaminophen (NORCO/VICODIN) 5-325 MG tablet Take 1 tablet by mouth every 6 (six) hours as needed for moderate pain.   0  . hydrOXYzine (ATARAX/VISTARIL) 25 MG tablet Take 1 tablet (25 mg total) by mouth every 6 (six) hours. 12 tablet 0  . ipratropium-albuterol (DUONEB) 0.5-2.5 (3) MG/3ML SOLN Take 3 mLs by nebulization 4 (four) times daily.   3  . levothyroxine (SYNTHROID, LEVOTHROID) 112 MCG tablet Take 224 mcg by mouth daily.  5  . lidocaine (LIDODERM) 5 % Place 1 patch onto the skin daily. Remove & Discard patch within 12 hours or as directed by MD 5 patch 0  . lisinopril (PRINIVIL,ZESTRIL) 5 MG tablet Take 1 tablet (5 mg total) by mouth daily. Please call for office visit [phone] 30 tablet 0  . loperamide (IMODIUM) 2 MG capsule Take 1 capsule (2 mg total) by mouth 4 (four) times daily as needed for diarrhea or loose stools. 12 capsule 0  . metFORMIN (GLUCOPHAGE) 500 MG tablet Take 1 tablet (500 mg total) by mouth 2 (two) times daily with a meal. 30 tablet 1  . methocarbamol (ROBAXIN) 500 MG tablet Take 1 tablet (500 mg total) by mouth every 8 (eight) hours as needed for muscle spasms. 20 tablet 0  . metoCLOPramide (REGLAN) 10 MG tablet Take 1 tablet (10 mg total) by mouth every 6 (six) hours as needed for nausea (or headache). 30 tablet 0  . metoprolol succinate (TOPROL-XL) 25 MG 24 hr tablet Take 25 mg by mouth in the morning.     . montelukast (SINGULAIR) 10 MG tablet Take 1 tablet (10 mg total) by mouth at bedtime. 30 tablet 0  . nitroGLYCERIN (NITROSTAT) 0.4 MG SL tablet Place 1 tablet (0.4 mg total) under the tongue every 5 (five) minutes as needed for chest pain. 12 tablet 0  . nystatin cream (MYCOSTATIN) Apply 1 application topically 2 (two) times daily  as needed for dry skin.   1  . omeprazole (PRILOSEC) 40 MG capsule Take 40 mg by mouth daily.  2  . OXYGEN Inhale 2 L into the lungs at bedtime.    . potassium chloride SA (K-DUR,KLOR-CON) 20 MEQ tablet Take 1 tablet (20  mEq total) by mouth 2 (two) times daily. 180 tablet 0  . pravastatin (PRAVACHOL) 40 MG tablet Take 40 mg by mouth every evening.   2  . QC STOOL SOFTENER PLS LAXATIVE 8.6-50 MG tablet Take 1 tablet by mouth at bedtime.  2  . tiotropium (SPIRIVA) 18 MCG inhalation capsule Place 18 mcg into inhaler and inhale daily.    Marland Kitchen torsemide (DEMADEX) 20 MG tablet Take 3 tablets (60 mg total) by mouth daily. 90 tablet 3  . predniSONE (DELTASONE) 20 MG tablet 3 tabs po day one, then 2 po daily x 4 days (Patient not taking: Reported on 09/30/2019) 11 tablet 0   No current facility-administered medications for this encounter.    Vitals:   09/30/19 1328  BP: 108/72  Pulse: (!) 102  SpO2: 93%  Weight: (!) 139.3 kg (307 lb)   Wt Readings from Last 3 Encounters:  09/30/19 (!) 139.3 kg (307 lb)  09/29/19 (!) 137.4 kg (303 lb)  09/12/19 (!) 140.2 kg (309 lb)   PHYSICAL EXAM: General:  Obese, cantankerous. No respiratory difficulty HEENT: normal Neck: supple. Thick neck, JVD assessment difficult. Carotids 2+ bilat; no bruits. No lymphadenopathy or thyromegaly appreciated. Cor: PMI nondisplaced. Regular rate & rhythm. No rubs, gallops or murmurs. Lungs: clear Abdomen: obese, large panus, soft, nontender, nondistended. No hepatosplenomegaly. No bruits or masses. Good bowel sounds Extremities: no cyanosis, clubbing, rash, trace -1+ bilateral LE edema Neuro: alert & oriented x 3, cranial nerves grossly intact. moves all 4 extremities w/o difficulty.     ASSESSMENT & PLAN:  1. Chronic Diastolic HF w/ Prominent RV Dysfunction  - Echoin 3/20 withEF 60-65%, mildly dilated RV with normal systolic function, respiratory variation of the IV septum =>most likely due to severe pulmonary disease, less likely constrictive pericarditis (normal-appearing pericardium on CT chest in 3/20).  - Echo repeated 5/21. LVEF 55-60% (EF slightly lower than prior exam). RV mildly enlarged but normal systolic function. Pulmonary  pressures could not be estimated.  - Suspect RV dysfunction 2/2 pulmonary HTN mainly related to underlying pulmonary disease, WHO Group 3 from COPD and suspect OSA/OHS (Previous sleep study in 2014 did show positive sleep apnea.  However repeat sleep study 2017 was negative for sleep apnea). - Had recent f/u w/ pulmonology. Note reviewed. They suspect she likely has OSA and have ordered repeat split-night sleep study to reassess.  - She is chronically NYHA III, confounded by morbid obesity, COPD and deconditioning  - She had good diuresis w/ IV Lasix administered by [facility]. Wt down 12 lb but she continues to complain of dyspnea. She feels that she is still fluid overloaded, but assessment difficult given body habitus. She does have 1+ LEE on exam but JVD assessment difficult. We discussed diuretic options including titration of her torsemide but she is adamant that she gets IV Lasix. We gave 1 x dose of 40 mg IV + 20 Meq of KCL -  I have recommended repeat RHC w/ Dr. Aundra Dubin (last study was in 2017).  -  Currently on 5 mg of lisinopril. I consider transitioning to [facility] however doubt her BP will be able to tolerate (SBP 108).  - She has concomitant T2DM but hesitant  to use SGLT2i w/ large panus increasing risk for possible GU infection - She is scheduled for outpatient RHC w/ Dr. Aundra Dubin tomorrow. Reviewed risk vs benefit and she is agreeable w/ plan. Further diuretic dosing adjustments TBD based on cath findings.   2. COPD, severe  - on Home O2 24/7  - followed by pulmonology  - Suspect she also has OSA/OHS. As outlined above, plan repeat split night sleep study  3. Obesity Body mass index is 51.09 kg/m.  - wt loss advised    F/u w/ Dr. Aundra Dubin post [place].   > 45 min was spent in direct patient care including administration of IV Lasix     Imane Burrough PA-C 1:49 PM

## 2019-09-30 NOTE — Addendum Note (Signed)
Encounter addended by: Consuelo Pandy, PA-C on: 09/30/2019 4:18 PM  Actions taken: Clinical Note Signed, Level of Service modified

## 2019-09-30 NOTE — Progress Notes (Addendum)
PCP: Dr Jonelle Sidle Primary Cardiologist: Dr Aundra Dubin   Reason for Visit: F/u for Chronic Diastolic Heart Failure   HPI: Michele Owens is a 59 y.o. female with a history of HTN, obesity, OSA, COPD, hyperlipidemia, diastolic CHF,and SVT.   She was admitted in 4/09 with diastolic CHF and COPD exacerbation. She was admitted in 1/17 with COPD exacerbation. She was set up for a Cardiolite in 2/17. This showed a small, moderate intensity partially reversible inferolateral defect. LHC/RHC in 3/17 showed no significant CAD, mildly elevated PCWP. PFTs in 3/17 showed severe COPD.   Admitted 10/15-10/19/17 with AECOPD and A/CdiastolicCHF. Diuresed with IV lasix. Completed course for CAP. Pt also noted have multinodular goiter on [place]. 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   Echo in 4/18 showed EF 45-50%, moderate dilated RV with moderately decreased systolic function.   She was admitted again in 4/18 with COPD exacerbation, acute on chronic diastolic CHF, and CAP.  She was admitted in 3/20 with acute on chronic diastolic CHF and treated with IV Lasix. Echo showed EF 60-65%, mild RV enlargement with normal function, respirophasic septal variation.  She has been in the ED 07/11/19, 07/08/19, 07/05/19, 06/28/19, 06/25/19, 06/23/19 for abdominal pain/chest pain. CT abdomen was stable. Given IV lasix on a couple occasions.   Had f/u in April and was felt to be mildly volume overloaded w/ poor response to PO Lasix. Diuretic was changed to torsemide 60 mg daily. Referral was also placed to pulmonology.   She had return f/u 5/21. Noted poor response to torsemide. Wt was trending up. She also endorsed increased abdominal distention and LEE. Repeat 2D echo showed normal LVEF 55-60% (lower than prior study, 60-65%). RV normal. RVSP was normal. [facility] w/ instructions to treat w/ 80 mg IV x 3 days, then return to torsemide 60 mg daily.    Returns today for f/u. Her wt is down 12 lb since last visit but she continues to complain of dyspnea and feels that she is "carrying fluid". On chronic home O2. No dyspnea at rest.    2D Echo 09/02/19   1. Very technically difficult study. Grossly normal LVEF. Pulmonary pressures could not be estimated. 2. Left ventricular ejection fraction, by estimation, is 55 to 60%. The left ventricle has normal function. The left ventricle has no regional wall motion abnormalities. There is mild left ventricular hypertrophy. Left ventricular diastolic parameters are indeterminate. 3. Right ventricular systolic function is normal. The right ventricular size is mildly enlarged. Tricuspid regurgitation signal is inadequate for assessing PA pressure. 4. The mitral valve was not well visualized. No evidence of mitral valve regurgitation. 5. The aortic valve was not well visualized. Aortic valve regurgitation is not visualized.  ROS: All systems negative except as listed in HPI, PMH and Problem List.  SH:  Social History   Socioeconomic History  . Marital status: Single    Spouse name: Not on file  . Number of children: Not on file  . Years of education: Not on file  . Highest education level: Not on file  Occupational History  . Occupation: unemployeed  Tobacco Use  . Smoking status: Former Smoker    Packs/day: 0.50    Years: 41.00    Pack years: 20.50    Types: Cigarettes    Quit date: 07/2016    Years since quitting: 3.2  . Smokeless tobacco: Never Used  . Tobacco comment: Starting to Wean Off -- using Nicotine  Patch  Vaping Use  . Vaping Use: Never used  Substance and Sexual Activity  . Alcohol use: Not Currently    Alcohol/week: 0.0 standard drinks    Comment: ? none now  . Drug use: Never    Comment: none for long time  . Sexual activity: Yes  Other Topics Concern  . Not on file  Social History Narrative  . Not on file   Social Determinants of Health   Financial Resource  Strain:   . Difficulty of Paying Living Expenses:   Food Insecurity:   . Worried About Charity fundraiser in the Last Year:   . Arboriculturist in the Last Year:   Transportation Needs:   . Film/video editor (Medical):   Marland Kitchen Lack of Transportation (Non-Medical):   Physical Activity:   . Days of Exercise per Week:   . Minutes of Exercise per Session:   Stress:   . Feeling of Stress :   Social Connections:   . Frequency of Communication with Friends and Family:   . Frequency of Social Gatherings with Friends and Family:   . Attends Religious Services:   . Active Member of Clubs or Organizations:   . Attends Archivist Meetings:   Marland Kitchen Marital Status:   Intimate Partner Violence:   . Fear of Current or Ex-Partner:   . Emotionally Abused:   Marland Kitchen Physically Abused:   . Sexually Abused:     FH:  Family History  Problem Relation Age of Onset  . Cancer Father        thought to be due to exposure to concrete  . Diabetes Mother   . Thyroid cancer Mother        dx in her 51s-60s  . Cancer Maternal Uncle        2 uncles with cancer NOS  . Brain cancer Paternal Aunt   . Cancer Cousin        maternal first cousin - NOS  . Cancer Cousin        maternal first cousin - NOS    Past Medical History:  Diagnosis Date  . Anxiety   . Arthritis   . Asthma   . Chronic diastolic CHF (congestive heart failure) ([place])   . COPD (chronic obstructive pulmonary disease) ([facility])    Uses Oxygen at night  . Depression   . Diabetes mellitus without complication ([place])   . Dyspnea    occ  . Family history of thyroid cancer   . GERD (gastroesophageal reflux disease)   . Gout   . Headache    migraines  . History of DVT of lower extremity   . History of nuclear stress test    Myoview 2/17:  Low risk stress nuclear study with a small, moderate intensity, partially reversible inferior lateral defect consistent with small prior infarct and minimal peri-infarct ischemia; EF 68 with normal wall  motion  . Hypertension   . Pneumonia   . Thyroid cancer ([place]) 09/23/2016    Current Outpatient Medications  Medication Sig Dispense Refill  . albuterol (PROVENTIL HFA;VENTOLIN HFA) 108 (90 Base) MCG/ACT inhaler Inhale 1-2 puffs into the lungs every 6 (six) hours as needed for wheezing or shortness of breath. 1 Inhaler 0  . allopurinol (ZYLOPRIM) 100 MG tablet Take 100 mg by mouth daily.    [name] ELLIPTA 62.5-25 MCG/INH AEPB Inhale 1 puff into the lungs daily.     . Blood Glucose Monitoring Suppl (ACCU-CHEK AVIVA PLUS)  w/Device KIT 1 each by Other route daily.   0  . butalbital-acetaminophen-caffeine (FIORICET, ESGIC) 50-325-40 MG tablet Take 1-2 tablets by mouth every 4 (four) hours as needed for migraine.    . calcitRIOL (ROCALTROL) 0.5 MCG capsule Take 1 capsule (0.5 mcg total) by mouth daily. 30 capsule 0  . carvedilol (COREG) 6.25 MG tablet Take 1 tablet (6.25 mg total) by mouth 2 (two) times daily with a meal. 60 tablet 0  . cetirizine (ZYRTEC) 10 MG tablet Take 10 mg by mouth daily.  2  . clonazePAM (KLONOPIN) 2 MG tablet Take 1 tablet (2 mg total) by mouth 3 (three) times daily. for anxiety 10 tablet 0  . diclofenac sodium (VOLTAREN) 1 % GEL Apply 2 g topically 4 (four) times daily as needed (for pain). 1 Tube 0  . dicyclomine (BENTYL) 20 MG tablet Take 1 tablet (20 mg total) by mouth every 6 (six) hours as needed for spasms. 12 tablet 0  . EPINEPHrine 0.3 mg/0.3 mL IJ SOAJ injection Inject 0.3 mg into the muscle daily as needed (allergic reaction).    . FEROSUL 325 (65 Fe) MG tablet Take 325 mg by mouth daily.  3  . glucose blood test strip Use as instructed to check blood sugar up to 4 times daily (Patient taking differently: 1 each by Other route See admin instructions. Use as instructed to check blood sugar up to 4 times daily) 100 each 0  . guaiFENesin (MUCINEX) 600 MG 12 hr tablet Take 2 tablets (1,200 mg total) by mouth 2 (two) times daily. 60 tablet 0  .  HYDROcodone-acetaminophen (NORCO/VICODIN) 5-325 MG tablet Take 1 tablet by mouth every 6 (six) hours as needed for moderate pain.   0  . hydrOXYzine (ATARAX/VISTARIL) 25 MG tablet Take 1 tablet (25 mg total) by mouth every 6 (six) hours. 12 tablet 0  . ipratropium-albuterol (DUONEB) 0.5-2.5 (3) MG/3ML SOLN Take 3 mLs by nebulization 4 (four) times daily.   3  . levothyroxine (SYNTHROID, LEVOTHROID) 112 MCG tablet Take 224 mcg by mouth daily.  5  . lidocaine (LIDODERM) 5 % Place 1 patch onto the skin daily. Remove & Discard patch within 12 hours or as directed by MD 5 patch 0  . lisinopril (PRINIVIL,ZESTRIL) 5 MG tablet Take 1 tablet (5 mg total) by mouth daily. Please call for office visit [phone] 30 tablet 0  . loperamide (IMODIUM) 2 MG capsule Take 1 capsule (2 mg total) by mouth 4 (four) times daily as needed for diarrhea or loose stools. 12 capsule 0  . metFORMIN (GLUCOPHAGE) 500 MG tablet Take 1 tablet (500 mg total) by mouth 2 (two) times daily with a meal. 30 tablet 1  . methocarbamol (ROBAXIN) 500 MG tablet Take 1 tablet (500 mg total) by mouth every 8 (eight) hours as needed for muscle spasms. 20 tablet 0  . metoCLOPramide (REGLAN) 10 MG tablet Take 1 tablet (10 mg total) by mouth every 6 (six) hours as needed for nausea (or headache). 30 tablet 0  . metoprolol succinate (TOPROL-XL) 25 MG 24 hr tablet Take 25 mg by mouth in the morning.     . montelukast (SINGULAIR) 10 MG tablet Take 1 tablet (10 mg total) by mouth at bedtime. 30 tablet 0  . nitroGLYCERIN (NITROSTAT) 0.4 MG SL tablet Place 1 tablet (0.4 mg total) under the tongue every 5 (five) minutes as needed for chest pain. 12 tablet 0  . nystatin cream (MYCOSTATIN) Apply 1 application topically 2 (two) times daily  as needed for dry skin.   1  . omeprazole (PRILOSEC) 40 MG capsule Take 40 mg by mouth daily.  2  . OXYGEN Inhale 2 L into the lungs at bedtime.    . potassium chloride SA (K-DUR,KLOR-CON) 20 MEQ tablet Take 1 tablet (20  mEq total) by mouth 2 (two) times daily. 180 tablet 0  . pravastatin (PRAVACHOL) 40 MG tablet Take 40 mg by mouth every evening.   2  . QC STOOL SOFTENER PLS LAXATIVE 8.6-50 MG tablet Take 1 tablet by mouth at bedtime.  2  . tiotropium (SPIRIVA) 18 MCG inhalation capsule Place 18 mcg into inhaler and inhale daily.    Marland Kitchen torsemide (DEMADEX) 20 MG tablet Take 3 tablets (60 mg total) by mouth daily. 90 tablet 3  . predniSONE (DELTASONE) 20 MG tablet 3 tabs po day one, then 2 po daily x 4 days (Patient not taking: Reported on 09/30/2019) 11 tablet 0   No current facility-administered medications for this encounter.    Vitals:   09/30/19 1328  BP: 108/72  Pulse: (!) 102  SpO2: 93%  Weight: (!) 139.3 kg (307 lb)   Wt Readings from Last 3 Encounters:  09/30/19 (!) 139.3 kg (307 lb)  09/29/19 (!) 137.4 kg (303 lb)  09/12/19 (!) 140.2 kg (309 lb)   PHYSICAL EXAM: General:  Obese, cantankerous. No respiratory difficulty HEENT: normal Neck: supple. Thick neck, JVD assessment difficult. Carotids 2+ bilat; no bruits. No lymphadenopathy or thyromegaly appreciated. Cor: PMI nondisplaced. Regular rate & rhythm. No rubs, gallops or murmurs. Lungs: clear Abdomen: obese, large panus, soft, nontender, nondistended. No hepatosplenomegaly. No bruits or masses. Good bowel sounds Extremities: no cyanosis, clubbing, rash, trace -1+ bilateral LE edema Neuro: alert & oriented x 3, cranial nerves grossly intact. moves all 4 extremities w/o difficulty.     ASSESSMENT & PLAN:  1. Chronic Diastolic HF w/ Prominent RV Dysfunction  - Echoin 3/20 withEF 60-65%, mildly dilated RV with normal systolic function, respiratory variation of the IV septum =>most likely due to severe pulmonary disease, less likely constrictive pericarditis (normal-appearing pericardium on CT chest in 3/20).  - Echo repeated 5/21. LVEF 55-60% (EF slightly lower than prior exam). RV mildly enlarged but normal systolic function. Pulmonary  pressures could not be estimated.  - Suspect RV dysfunction 2/2 pulmonary HTN mainly related to underlying pulmonary disease, WHO Group 3 from COPD and suspect OSA/OHS (Previous sleep study in 2014 did show positive sleep apnea.  However repeat sleep study 2017 was negative for sleep apnea). - Had recent f/u w/ pulmonology. Note reviewed. They suspect she likely has OSA and have ordered repeat split-night sleep study to reassess.  - She is chronically NYHA III, confounded by morbid obesity, COPD and deconditioning  - She had good diuresis w/ IV Lasix administered by [facility]. Wt down 12 lb but she continues to complain of dyspnea. She feels that she is still fluid overloaded, but assessment difficult given body habitus. She does have 1+ LEE on exam but JVD assessment difficult. We discussed diuretic options including titration of her torsemide but she is adamant that she gets IV Lasix. We gave 1 x dose of 40 mg IV + 20 Meq of KCL -  I have recommended repeat RHC w/ Dr. Aundra Dubin (last study was in 2017).  -  Currently on 5 mg of lisinopril. I consider transitioning to [facility] however doubt her BP will be able to tolerate (SBP 108).  - She has concomitant T2DM but hesitant  to use SGLT2i w/ large panus increasing risk for possible GU infection - She is scheduled for outpatient RHC w/ Dr. Aundra Dubin tomorrow. Reviewed risk vs benefit and she is agreeable w/ plan. Further diuretic dosing adjustments TBD based on cath findings.   2. COPD, severe  - on Home O2 24/7  - followed by pulmonology  - Suspect she also has OSA/OHS. As outlined above, plan repeat split night sleep study  3. Obesity Body mass index is 51.09 kg/m.  - wt loss advised    F/u w/ Dr. Aundra Dubin [place].   > 45 min was spent in direct patient care including administration of IV Lasix     Bryanna Yim PA-C 1:49 PM

## 2019-10-01 ENCOUNTER — Ambulatory Visit (HOSPITAL_COMMUNITY)
Admission: RE | Admit: 2019-10-01 | Discharge: 2019-10-01 | Disposition: A | Discharge status: Home / Self Care | Payer: Medicaid Other | Source: Ambulatory Visit | Attending: Cardiology | Admitting: Cardiology

## 2019-10-01 ENCOUNTER — Encounter (HOSPITAL_COMMUNITY)
Admission: RE | Disposition: A | Discharge status: Home / Self Care | Payer: Self-pay | Source: Ambulatory Visit | Attending: Cardiology

## 2019-10-01 ENCOUNTER — Other Ambulatory Visit: Payer: Self-pay

## 2019-10-01 DIAGNOSIS — K219 Gastro-esophageal reflux disease without esophagitis: Secondary | ICD-10-CM | POA: Diagnosis not present

## 2019-10-01 DIAGNOSIS — E785 Hyperlipidemia, unspecified: Secondary | ICD-10-CM | POA: Insufficient documentation

## 2019-10-01 DIAGNOSIS — Z9981 Dependence on supplemental oxygen: Secondary | ICD-10-CM | POA: Diagnosis not present

## 2019-10-01 DIAGNOSIS — G4733 Obstructive sleep apnea (adult) (pediatric): Secondary | ICD-10-CM | POA: Diagnosis not present

## 2019-10-01 DIAGNOSIS — E042 Nontoxic multinodular goiter: Secondary | ICD-10-CM | POA: Insufficient documentation

## 2019-10-01 DIAGNOSIS — Z86718 Personal history of other venous thrombosis and embolism: Secondary | ICD-10-CM | POA: Insufficient documentation

## 2019-10-01 DIAGNOSIS — G43909 Migraine, unspecified, not intractable, without status migrainosus: Secondary | ICD-10-CM | POA: Insufficient documentation

## 2019-10-01 DIAGNOSIS — Z79890 Hormone replacement therapy (postmenopausal): Secondary | ICD-10-CM | POA: Diagnosis not present

## 2019-10-01 DIAGNOSIS — M109 Gout, unspecified: Secondary | ICD-10-CM | POA: Diagnosis not present

## 2019-10-01 DIAGNOSIS — J441 Chronic obstructive pulmonary disease with (acute) exacerbation: Secondary | ICD-10-CM | POA: Diagnosis not present

## 2019-10-01 DIAGNOSIS — Z87891 Personal history of nicotine dependence: Secondary | ICD-10-CM | POA: Diagnosis not present

## 2019-10-01 DIAGNOSIS — Z7984 Long term (current) use of oral hypoglycemic drugs: Secondary | ICD-10-CM | POA: Insufficient documentation

## 2019-10-01 DIAGNOSIS — Z833 Family history of diabetes mellitus: Secondary | ICD-10-CM | POA: Insufficient documentation

## 2019-10-01 DIAGNOSIS — F419 Anxiety disorder, unspecified: Secondary | ICD-10-CM | POA: Diagnosis not present

## 2019-10-01 DIAGNOSIS — Z6841 Body Mass Index (BMI) 40.0 and over, adult: Secondary | ICD-10-CM | POA: Insufficient documentation

## 2019-10-01 DIAGNOSIS — E119 Type 2 diabetes mellitus without complications: Secondary | ICD-10-CM | POA: Insufficient documentation

## 2019-10-01 DIAGNOSIS — R0600 Dyspnea, unspecified: Secondary | ICD-10-CM | POA: Insufficient documentation

## 2019-10-01 DIAGNOSIS — I509 Heart failure, unspecified: Secondary | ICD-10-CM

## 2019-10-01 DIAGNOSIS — I5033 Acute on chronic diastolic (congestive) heart failure: Secondary | ICD-10-CM | POA: Insufficient documentation

## 2019-10-01 DIAGNOSIS — Z79899 Other long term (current) drug therapy: Secondary | ICD-10-CM | POA: Diagnosis not present

## 2019-10-01 DIAGNOSIS — I110 Hypertensive heart disease with heart failure: Secondary | ICD-10-CM | POA: Insufficient documentation

## 2019-10-01 DIAGNOSIS — M1990 Unspecified osteoarthritis, unspecified site: Secondary | ICD-10-CM | POA: Insufficient documentation

## 2019-10-01 HISTORY — PX: RIGHT HEART CATH: CATH118263

## 2019-10-01 LAB — GLUCOSE, CAPILLARY
Glucose-Capillary: 116 mg/dL — ABNORMAL HIGH (ref 70–99)
Glucose-Capillary: 111 mg/dL — ABNORMAL HIGH (ref 70–99)

## 2019-10-01 LAB — POCT I-STAT EG7
Acid-Base Excess: 7 mmol/L — ABNORMAL HIGH (ref 0.0–2.0)
Acid-Base Excess: 9 mmol/L — ABNORMAL HIGH (ref 0.0–2.0)
Bicarbonate: 35.1 mmol/L — ABNORMAL HIGH (ref 20.0–28.0)
Bicarbonate: 36.9 mmol/L — ABNORMAL HIGH (ref 20.0–28.0)
Calcium, Ion: 1.09 mmol/L — ABNORMAL LOW (ref 1.15–1.40)
Calcium, Ion: 1.2 mmol/L (ref 1.15–1.40)
HCT: 36 % (ref 36.0–46.0)
HCT: 37 % (ref 36.0–46.0)
Hemoglobin: 12.2 g/dL (ref 12.0–15.0)
Hemoglobin: 12.6 g/dL (ref 12.0–15.0)
O2 Saturation: 80 %
O2 Saturation: 78 %
Potassium: 3.4 mmol/L — ABNORMAL LOW (ref 3.5–5.1)
Potassium: 3.6 mmol/L (ref 3.5–5.1)
Sodium: 141 mmol/L (ref 135–145)
Sodium: 139 mmol/L (ref 135–145)
TCO2: 37 mmol/L — ABNORMAL HIGH (ref 22–32)
TCO2: 39 mmol/L — ABNORMAL HIGH (ref 22–32)
pCO2, Ven: 64.2 mmHg — ABNORMAL HIGH (ref 44.0–60.0)
pCO2, Ven: 67.8 mmHg — ABNORMAL HIGH (ref 44.0–60.0)
pH, Ven: 7.345 (ref 7.250–7.430)
pH, Ven: 7.344 (ref 7.250–7.430)
pO2, Ven: 49 mmHg — ABNORMAL HIGH (ref 32.0–45.0)
pO2, Ven: 47 mmHg — ABNORMAL HIGH (ref 32.0–45.0)

## 2019-10-01 LAB — CBC
HCT: 38.4 % (ref 36.0–46.0)
Hemoglobin: 11.7 g/dL — ABNORMAL LOW (ref 12.0–15.0)
MCH: 28.1 pg (ref 26.0–34.0)
MCHC: 30.5 g/dL (ref 30.0–36.0)
MCV: 92.1 fL (ref 80.0–100.0)
Platelets: 286 10*3/uL (ref 150–400)
RBC: 4.17 MIL/uL (ref 3.87–5.11)
RDW: 13.1 % (ref 11.5–15.5)
WBC: 13.5 10*3/uL — ABNORMAL HIGH (ref 4.0–10.5)
nRBC: 0 % (ref 0.0–0.2)

## 2019-10-01 SURGERY — RIGHT HEART CATH
Anesthesia: LOCAL

## 2019-10-01 MED ORDER — ONDANSETRON HCL 4 MG/2ML IJ SOLN: 4.0000 mg | Freq: Four times a day (QID) | INTRAMUSCULAR | Status: DC | PRN

## 2019-10-01 MED ORDER — LIDOCAINE HCL (PF) 1 % IJ SOLN
INTRAMUSCULAR | Status: DC | PRN
  Administered 2019-10-01: 2 mL

## 2019-10-01 MED ORDER — LIDOCAINE HCL (PF) 1 % IJ SOLN
INTRAMUSCULAR | Status: AC
  Filled 2019-10-01: qty 30

## 2019-10-01 MED ORDER — LABETALOL HCL 5 MG/ML IV SOLN: 10.0000 mg | INTRAVENOUS | Status: DC | PRN

## 2019-10-01 MED ORDER — HYDRALAZINE HCL 20 MG/ML IJ SOLN: 10.0000 mg | INTRAMUSCULAR | Status: DC | PRN

## 2019-10-01 MED ORDER — SODIUM CHLORIDE 0.9 % IV SOLN: INTRAVENOUS | Status: DC

## 2019-10-01 MED ORDER — SODIUM CHLORIDE 0.9 % IV SOLN: 250.0000 mL | INTRAVENOUS | Status: DC | PRN

## 2019-10-01 MED ORDER — ASPIRIN 81 MG PO CHEW: 81.0000 mg | CHEWABLE_TABLET | ORAL | Status: DC

## 2019-10-01 MED ORDER — SODIUM CHLORIDE 0.9% FLUSH: 3.0000 mL | Freq: Two times a day (BID) | INTRAVENOUS | Status: DC

## 2019-10-01 MED ORDER — SODIUM CHLORIDE 0.9% FLUSH: 3.0000 mL | INTRAVENOUS | Status: DC | PRN

## 2019-10-01 MED ORDER — ACETAMINOPHEN 325 MG PO TABS: 650.0000 mg | ORAL_TABLET | ORAL | Status: DC | PRN

## 2019-10-01 MED ORDER — HEPARIN (PORCINE) IN NACL 1000-0.9 UT/500ML-% IV SOLN
INTRAVENOUS | Status: DC | PRN
  Administered 2019-10-01: 500 mL

## 2019-10-01 MED ORDER — TORSEMIDE 20 MG PO TABS
80.0000 mg | ORAL_TABLET | Freq: Every day | ORAL | 3 refills | Status: DC
Start: 2019-10-01 — End: 2019-11-19

## 2019-10-01 MED ORDER — HEPARIN (PORCINE) IN NACL 1000-0.9 UT/500ML-% IV SOLN
INTRAVENOUS | Status: AC
  Filled 2019-10-01: qty 500

## 2019-10-01 SURGICAL SUPPLY — 9 items
CATH SWAN DBL LUMAN 5F 110 (CATHETERS) ×1 IMPLANT
GLIDESHEATH SLEND A-KIT 6F 22G (SHEATH) ×1 IMPLANT
GLIDESHEATH SLEND SS 6F .021 (SHEATH) IMPLANT
PACK CARDIAC CATHETERIZATION (CUSTOM PROCEDURE TRAY) ×2 IMPLANT
PROTECTION STATION PRESSURIZED (MISCELLANEOUS) ×2
STATION PROTECTION PRESSURIZED (MISCELLANEOUS) IMPLANT
TRANSDUCER W/STOPCOCK (MISCELLANEOUS) ×2 IMPLANT
TUBING ART PRESS 72  MALE/FEM (TUBING) ×2
TUBING ART PRESS 72 MALE/FEM (TUBING) IMPLANT

## 2019-10-01 NOTE — Discharge Instructions (Signed)
Brachial Site Care  This sheet gives you information about how to care for yourself after your procedure. Your health care provider may also give you more specific instructions. If you have problems or questions, contact your health care provider. What can I expect after the procedure? After the procedure, it is common to have:  Bruising and tenderness at the catheter insertion area. Follow these instructions at home: Medicines  Take over-the-counter and prescription medicines only as told by your health care provider. Insertion site care  Follow instructions from your health care provider about how to take care of your insertion site. Make sure you: ? Wash your hands with soap and water before you change your bandage (dressing). If soap and water are not available, use hand sanitizer. ? Change your dressing as told by your health care provider. ? Leave stitches (sutures), skin glue, or adhesive strips in place. These skin closures may need to stay in place for 2 weeks or longer. If adhesive strip edges start to loosen and curl up, you may trim the loose edges. Do not remove adhesive strips completely unless your health care provider tells you to do that.  Check your insertion site every day for signs of infection. Check for: ? Redness, swelling, or pain. ? Fluid or blood. ? Pus or a bad smell. ? Warmth.  Do not take baths, swim, or use a hot tub until your health care provider approves.  You may shower 24-48 hours after the procedure, or as directed by your health care provider. ? Remove the dressing and gently wash the site with plain soap and water. ? Pat the area dry with a clean towel. ? Do not rub the site. That could cause bleeding.  Do not apply powder or lotion to the site. Activity   For 24 hours after the procedure, or as directed by your health care provider: ? Do not flex or bend the affected arm. ? Do not push or pull heavy objects with the affected arm. ? Do not  drive yourself home from the hospital or clinic. You may drive 24 hours after the procedure unless your health care provider tells you not to. ? Do not operate machinery or power tools.  Do not lift anything that is heavier than 10 lb (4.5 kg), or the limit that you are told, until your health care provider says that it is safe.  Ask your health care provider when it is okay to: ? Return to work or school. ? Resume usual physical activities or sports. ? Resume sexual activity. General instructions  If the catheter site starts to bleed, raise your arm and put firm pressure on the site. If the bleeding does not stop, get help right away. This is a medical emergency.  If you went home on the same day as your procedure, a responsible adult should be with you for the first 24 hours after you arrive home.  Keep all follow-up visits as told by your health care provider. This is important. Contact a health care provider if:  You have a fever.  You have redness, swelling, or yellow drainage around your insertion site. Get help right away if:  You have unusual pain at the radial site.  The catheter insertion area swells very fast.  The insertion area is bleeding, and the bleeding does not stop when you hold steady pressure on the area.  Your arm or hand becomes pale, cool, tingly, or numb. These symptoms may represent a serious problem   that is an emergency. Do not wait to see if the symptoms will go away. Get medical help right away. Call your local emergency services (911 in the [place] Do not drive yourself to the hospital. Summary  After the procedure, it is common to have bruising and tenderness at the site.  Follow instructions from your health care provider about how to take care of your radial site wound. Check the wound every day for signs of infection.  Do not lift anything that is heavier than 10 lb (4.5 kg), or the limit that you are told, until your health care provider says  that it is safe. This information is not intended to replace advice given to you by your health care provider. Make sure you discuss any questions you have with your health care provider. Document Revised: 05/02/2017 Document Reviewed: 05/02/2017 Elsevier Patient Education  [place]. Increase torsemide to 80 mg daily (4 tabs in morning).

## 2019-10-01 NOTE — Research (Signed)
PHDE Informed Consent   Subject Name: Michele Owens  Subject met inclusion and exclusion criteria.  The informed consent form, study requirements and expectations were reviewed with the subject and questions and concerns were addressed prior to the signing of the consent form.  The subject verbalized understanding of the trail requirements.  The subject agreed to participate in the PHDE trial and signed the informed consent.  The informed consent was obtained prior to performance of any protocol-specific procedures for the subject.  A copy of the signed informed consent was given to the subject and a copy was placed in the subject's medical record.  Neva Seat 10/01/2019, 12:46 PM

## 2019-10-01 NOTE — Interval H&P Note (Signed)
History and Physical Interval Note:  10/01/2019 1:41 PM  Michele Owens  has presented today for surgery, with the diagnosis of heart failure.  The various methods of treatment have been discussed with the patient and family. After consideration of risks, benefits and other options for treatment, the patient has consented to  Procedure(s): RIGHT HEART CATH (N/A) as a surgical intervention.  The patient's history has been reviewed, patient examined, no change in status, stable for surgery.  I have reviewed the patient's chart and labs.  Questions were answered to the patient's satisfaction.     Jasir Rother [employer]

## 2019-10-02 ENCOUNTER — Encounter (HOSPITAL_COMMUNITY): Payer: Self-pay | Admitting: Cardiology

## 2019-10-08 ENCOUNTER — Telehealth: Payer: Self-pay | Admitting: Pulmonary Disease

## 2019-10-08 DIAGNOSIS — J449 Chronic obstructive pulmonary disease, unspecified: Secondary | ICD-10-CM

## 2019-10-08 NOTE — Telephone Encounter (Signed)
Patient called with results of home sleep study and need for 3 liters oxygen at night. Order placed to DME. Patient requested a letter that her oxygen needs have increased. Letter prepared and mailed.

## 2019-10-08 NOTE — Procedures (Signed)
    Patient Name: Michele Owens, Michele Owens Date: 09/29/2019 Gender: Female D.O.B: 12-Jan-1961 Age (years): 59 Referring Provider: Chesley Mires MD, ABSM Height (inches): 51 Interpreting Physician: Chesley Mires MD, ABSM Weight (lbs): 287 RPSGT: Carolin Coy BMI: 68 MRN: [id number] Neck Size: 16.00  CLINICAL INFORMATION Sleep Study Type: NPSG  Indication for sleep study: 59 year old COPD and obesity hypoventilation syndrome.  She is referred to the sleep lab for assessment of obstructive sleep apnea.  Epworth Sleepiness Score: 13  Most recent polysomnogram dated 08/24/2015 revealed an AHI of 3.6/h.  SLEEP STUDY TECHNIQUE As per the AASM Manual for the Scoring of Sleep and Associated Events v2.3 (April 2016) with a hypopnea requiring 4% desaturations.  The channels recorded and monitored were frontal, central and occipital EEG, electrooculogram (EOG), submentalis EMG (chin), nasal and oral airflow, thoracic and abdominal wall motion, anterior tibialis EMG, snore microphone, electrocardiogram, and pulse oximetry.  MEDICATIONS Medications self-administered by patient taken the night of the study : VOLTAREN GEL, Senna Docusate, KLONOPIN, OXYCODONE HCL & ACETAMINOPHEN, ROBAXIN  SLEEP ARCHITECTURE The study was initiated at 11:33:51 PM and ended at 5:27:25 AM.  Sleep onset time was 23.2 minutes and the sleep efficiency was 69.2%%. The total sleep time was 244.8 minutes.  Stage REM latency was 115.5 minutes.  The patient spent 19.4%% of the night in stage N1 sleep, 72.4%% in stage N2 sleep, 0.0%% in stage N3 and 8.2% in REM.  Alpha intrusion was absent.  Supine sleep was 63.65%.  RESPIRATORY PARAMETERS The overall apnea/hypopnea index (AHI) was 4.2 per hour. There were 0 total apneas, including 0 obstructive, 0 central and 0 mixed apneas. There were 17 hypopneas and 19 RERAs.  The AHI during Stage REM sleep was 36.0 per hour.  AHI while supine was 4.2 per hour.  The mean  oxygen saturation was 89.6%. The minimum SpO2 during sleep was 77.0%.  She used 3 liters supplemental oxygen during the study.  moderate snoring was noted during this study.  CARDIAC DATA The 2 lead EKG demonstrated sinus rhythm. The mean heart rate was 86.7 beats per minute. Other EKG findings include: PVCs.  LEG MOVEMENT DATA The total PLMS were 0 with a resulting PLMS index of 0.0. Associated arousal with leg movement index was 0.0 .  IMPRESSIONS - Overall AHI was 4.2.  REM AHI was 36.  SpO2 low 77%. - She had persistent hypoxemia while asleep lasting more than 5 minutes in the abscence of other respiratory events.  She was titrated on 3 liters supplemental oxygen with improvement in her oxygenation.  DIAGNOSIS - Nocturnal Hypoxemia (327.26 [G47.36 ICD-10])  RECOMMENDATIONS - 3 liters supplemental oxygen with sleep - Weight loss  [Electronically signed] 10/08/2019 10:02 AM  Chesley Mires MD, ABSM Diplomate, American Board of Sleep Medicine   NPI: [id number]

## 2019-10-08 NOTE — Telephone Encounter (Signed)
PSG 09/29/19 >> AHI 4.2, REM AHI 36, SpO2 low 77%.  Used 3 liters O2.   Please let her know her sleep study did not show significant sleep apnea.  She did need 3 liters oxygen at night.  Please send order to her DME to have supplemental oxygen at night increased to 3 liters.

## 2019-10-09 DIAGNOSIS — Z419 Encounter for procedure for purposes other than remedying health state, unspecified: Secondary | ICD-10-CM | POA: Diagnosis not present

## 2019-10-14 ENCOUNTER — Telehealth: Payer: Self-pay | Admitting: Adult Health

## 2019-10-14 ENCOUNTER — Telehealth: Payer: Self-pay | Admitting: Pulmonary Disease

## 2019-10-14 NOTE — Telephone Encounter (Signed)
ATC patient unable to reach will call back.

## 2019-10-14 NOTE — Telephone Encounter (Signed)
error 

## 2019-10-16 NOTE — Telephone Encounter (Signed)
Letter was mailed to patient as requested. Nothing further needed.

## 2019-10-16 NOTE — Telephone Encounter (Signed)
I spoke with the Michele Owens  She is wanting [place] to send a letter to social security stating that she is on 3lpm  She states that [facility] told her yesterday that she was taking care of this  Msg from 10/08/19 states letter was prepared and mailed  Was this mailed to Michele Owens or social security? Michele Owens asking to speak with [facility]

## 2019-11-09 DIAGNOSIS — Z419 Encounter for procedure for purposes other than remedying health state, unspecified: Secondary | ICD-10-CM | POA: Diagnosis not present

## 2019-11-19 ENCOUNTER — Other Ambulatory Visit (HOSPITAL_COMMUNITY): Payer: Self-pay | Admitting: Cardiology

## 2019-12-01 DIAGNOSIS — E11621 Type 2 diabetes mellitus with foot ulcer: Secondary | ICD-10-CM | POA: Diagnosis not present

## 2019-12-01 DIAGNOSIS — Z9981 Dependence on supplemental oxygen: Secondary | ICD-10-CM | POA: Diagnosis not present

## 2019-12-01 DIAGNOSIS — J449 Chronic obstructive pulmonary disease, unspecified: Secondary | ICD-10-CM | POA: Diagnosis not present

## 2019-12-01 DIAGNOSIS — E119 Type 2 diabetes mellitus without complications: Secondary | ICD-10-CM | POA: Diagnosis not present

## 2019-12-08 ENCOUNTER — Ambulatory Visit (INDEPENDENT_AMBULATORY_CARE_PROVIDER_SITE_OTHER): Payer: Medicaid Other | Admitting: Podiatry

## 2019-12-08 ENCOUNTER — Other Ambulatory Visit: Payer: Self-pay

## 2019-12-08 DIAGNOSIS — M2141 Flat foot [pes planus] (acquired), right foot: Secondary | ICD-10-CM | POA: Diagnosis not present

## 2019-12-08 DIAGNOSIS — M79674 Pain in right toe(s): Secondary | ICD-10-CM

## 2019-12-08 DIAGNOSIS — B351 Tinea unguium: Secondary | ICD-10-CM | POA: Diagnosis not present

## 2019-12-08 DIAGNOSIS — M79675 Pain in left toe(s): Secondary | ICD-10-CM

## 2019-12-08 DIAGNOSIS — M79671 Pain in right foot: Secondary | ICD-10-CM

## 2019-12-08 DIAGNOSIS — M79672 Pain in left foot: Secondary | ICD-10-CM | POA: Diagnosis not present

## 2019-12-08 DIAGNOSIS — M2142 Flat foot [pes planus] (acquired), left foot: Secondary | ICD-10-CM | POA: Diagnosis not present

## 2019-12-08 DIAGNOSIS — M722 Plantar fascial fibromatosis: Secondary | ICD-10-CM | POA: Diagnosis not present

## 2019-12-08 DIAGNOSIS — E1142 Type 2 diabetes mellitus with diabetic polyneuropathy: Secondary | ICD-10-CM | POA: Diagnosis not present

## 2019-12-08 NOTE — Progress Notes (Signed)
  Subjective:  Patient ID: Michele Owens, female    DOB: [birth year],  MRN: [id number]  Chief Complaint  Patient presents with  . Foot Pain    Pt states bilateral plantar heel pain, requests injections.  . Nail Problem    Nail trim 1-5 bilateral    59 y.o. female presents with the above complaint. History confirmed with patient.  Injections have been helpful.  Objective:  Physical Exam: warm, good capillary refill, no trophic changes or ulcerative lesions and normal DP and PT pulses, onychomycosis x10 toenails and bilateral heel pain with palpation of the insertion of plantar fascial medial calcaneal tubercle. Assessment:   1. Pain in both feet   2. Plantar fasciitis, left   3. Plantar fasciitis, right   4. Onychomycosis   5. Pain due to onychomycosis of toenails of both feet   6. Pes planus of both feet   7. Diabetic peripheral neuropathy associated with type 2 diabetes mellitus ([place])      Plan:  Patient was evaluated and treated and all questions answered.   Patient educated on diabetes. Discussed proper diabetic foot care and discussed risks and complications of disease. Educated patient in depth on reasons to return to the office immediately should he/she discover anything concerning or new on the feet. All questions answered. Discussed proper shoes as well.   Discussed the etiology and treatment options for the condition in detail with the patient. Educated patient on the topical and oral treatment options for mycotic nails. Recommended debridement of the nails today. Sharp and mechanical debridement performed of all painful and mycotic nails today. Nails debrided in length and thickness using a nail nipper and a mechanical burr to level of comfort. Discussed treatment options including appropriate shoe gear. Follow up as needed for painful nails.  After sterile prep with povidone-iodine solution and alcohol, the bilateral medial heel was injected with 0.5cc 2% xylocaine  plain, 0.5cc 0.5% marcaine plain, 5mg  triamcinolone acetonide, and 2mg  dexamethasone was injected along the plantar fascia at the insertion on the plantar calcaneus. The patient tolerated the procedure well without complication.  I discussed with her the risks and benefits of this and that this could cause transient hyperglycemia and that she should closely watch her blood sugar and diet over the next few days.   No follow-ups on file.

## 2019-12-10 ENCOUNTER — Telehealth: Payer: Self-pay

## 2019-12-10 DIAGNOSIS — Z419 Encounter for procedure for purposes other than remedying health state, unspecified: Secondary | ICD-10-CM | POA: Diagnosis not present

## 2019-12-10 NOTE — Telephone Encounter (Signed)
Pt called and stated that she was in the office on Monday. Pt received a 3 injections in her heel, the area is sore and pt stated feels that the needle is still stuck in her heel. Please follow up with patient.

## 2019-12-11 NOTE — Telephone Encounter (Signed)
Returned her call, was unable to leave a VM. The needle is not in her heel, she likely has some soreness related to the injection (could be a steroid flare). Should resolve in 1-2 days, she should ice/elevate and take an NSAID or tylenol for the pain as needed. Thanks!

## 2019-12-16 ENCOUNTER — Ambulatory Visit: Payer: Medicaid Other | Admitting: Pulmonary Disease

## 2019-12-22 IMAGING — DX DG CERVICAL SPINE COMPLETE 4+V
5 series · 5 of 5 positions shown · non-contrast
Comparison: Cervical spine radiographs 12/29/2016

CLINICAL DATA: Cervical spine pain after a fall.

EXAM:
CERVICAL SPINE - COMPLETE 4+ VIEW

[c-spine lat]
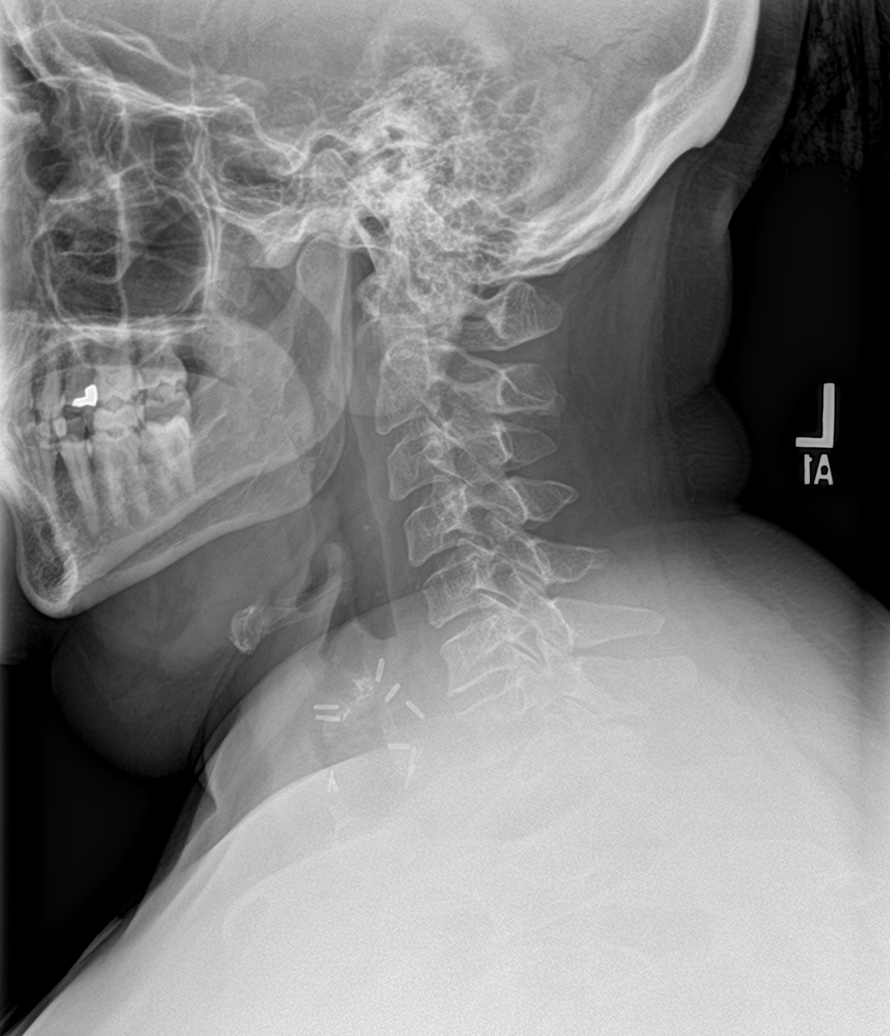

[c-spine obl (1 of 2)]
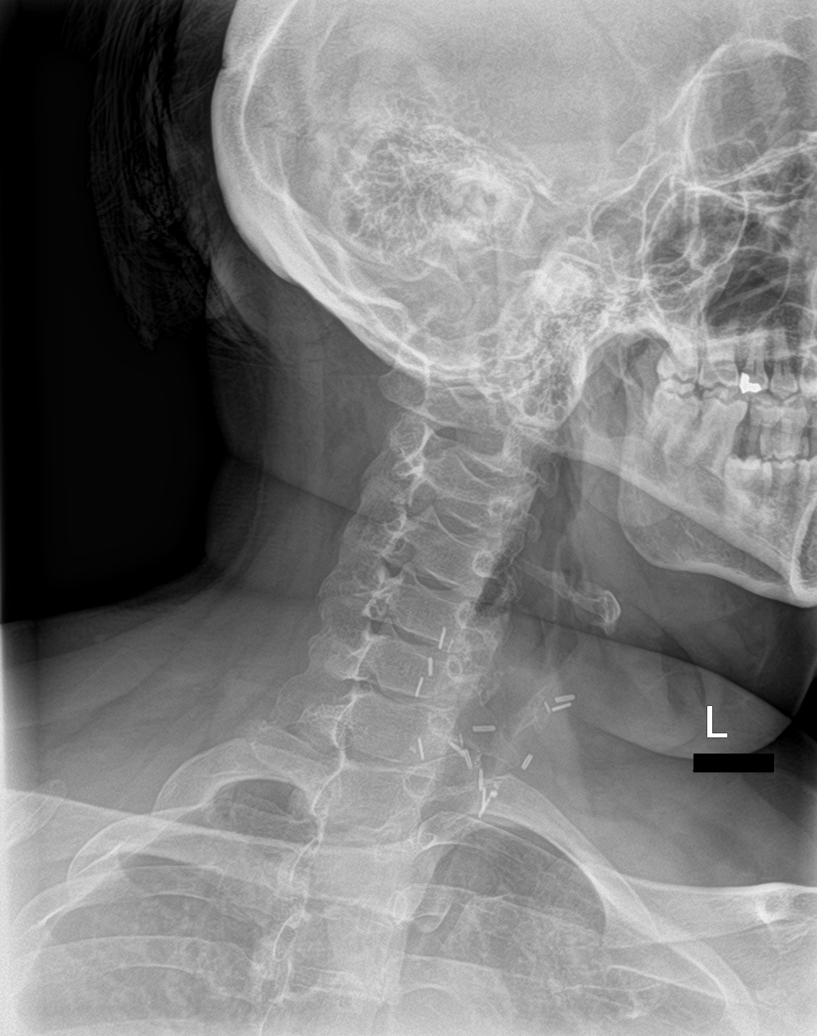

[c-spine obl (2 of 2)]
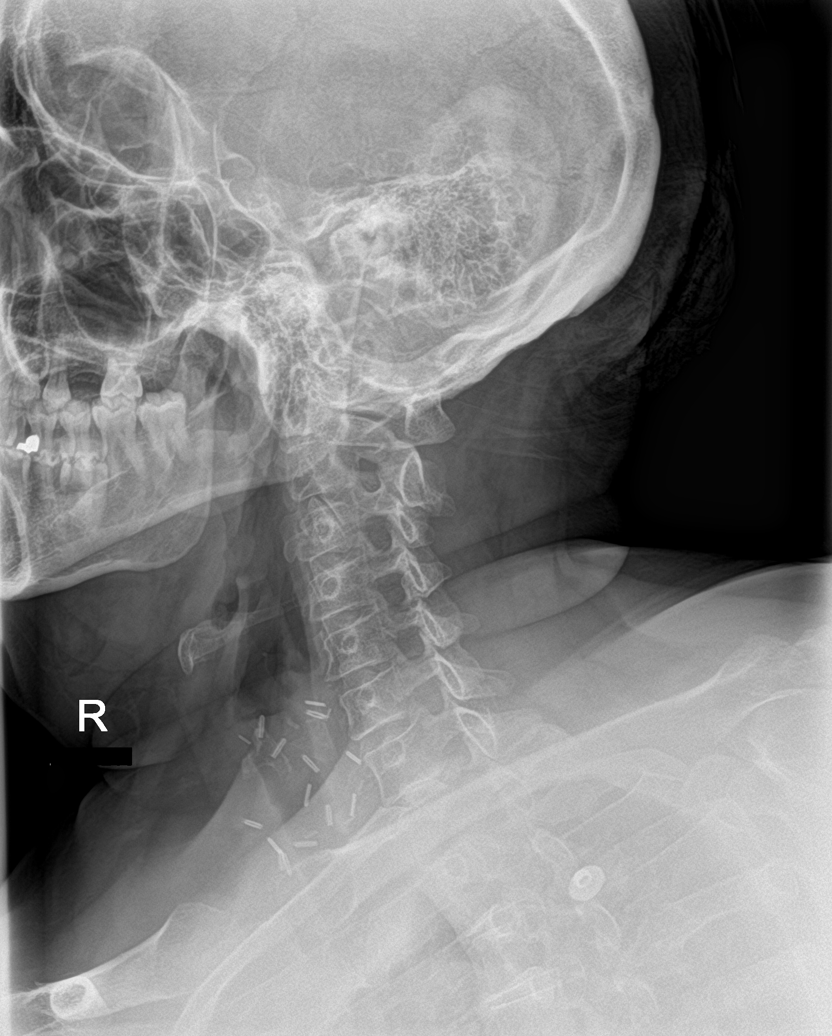

[c-spine ap]
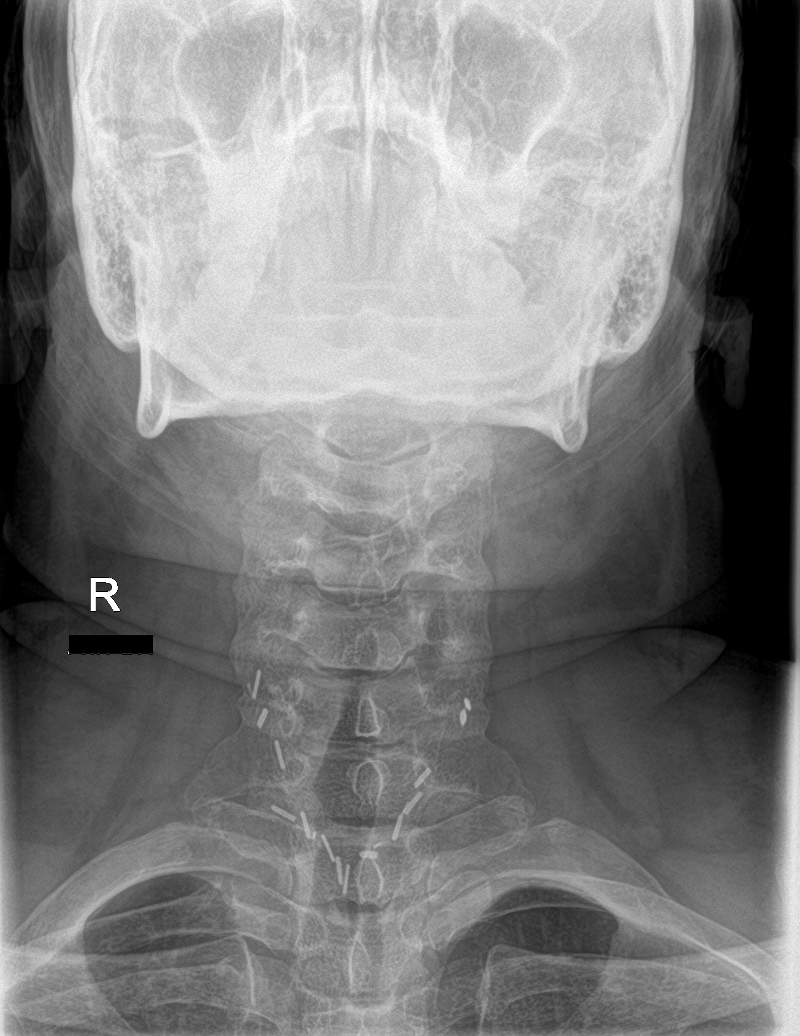

[c-spine open mouth]
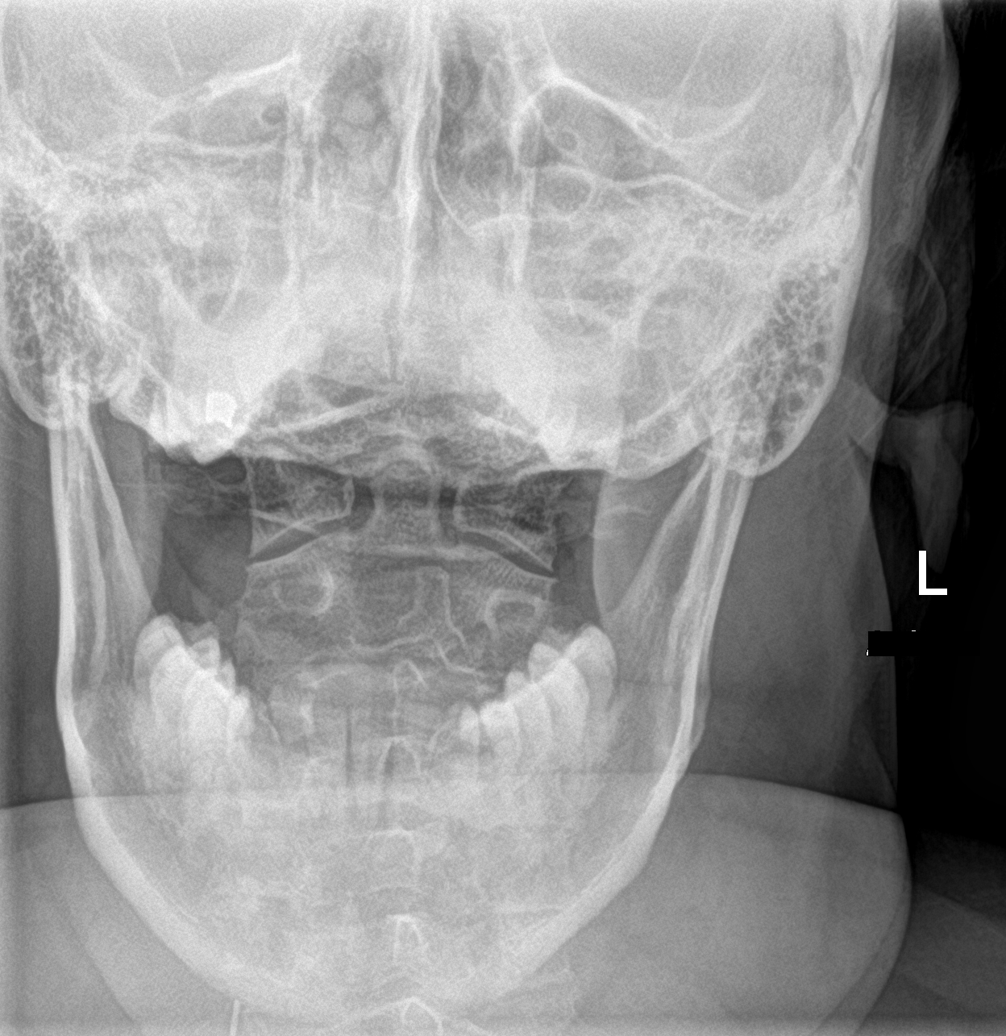

[5 of 5 positions shown; findings below may reference images not displayed]

FINDINGS: Cervical spine is visualized the skull base through the
cervicothoracic junction. The prevertebral soft tissues are within
normal limits. Mild endplate degenerative changes and uncovertebral
spurring is present bilaterally at C5-6 and C6-7. Surgical clips are
present at the thyroid bed, unchanged.

No acute fracture traumatic subluxation is present. The lung apices
are clear.
IMPRESSION: 1. No acute abnormality.
2. Stable degenerative changes at C5-6 and C6-7.

## 2019-12-22 IMAGING — DX DG LUMBAR SPINE COMPLETE 4+V
5 series · 5 of 5 positions shown · non-contrast
Comparison: Lumbar spine radiographs 12/29/2016.

CLINICAL DATA: Pain.  Fall.  Initial encounter.

EXAM:
LUMBAR SPINE - COMPLETE 4+ VIEW

[l-spine ap]
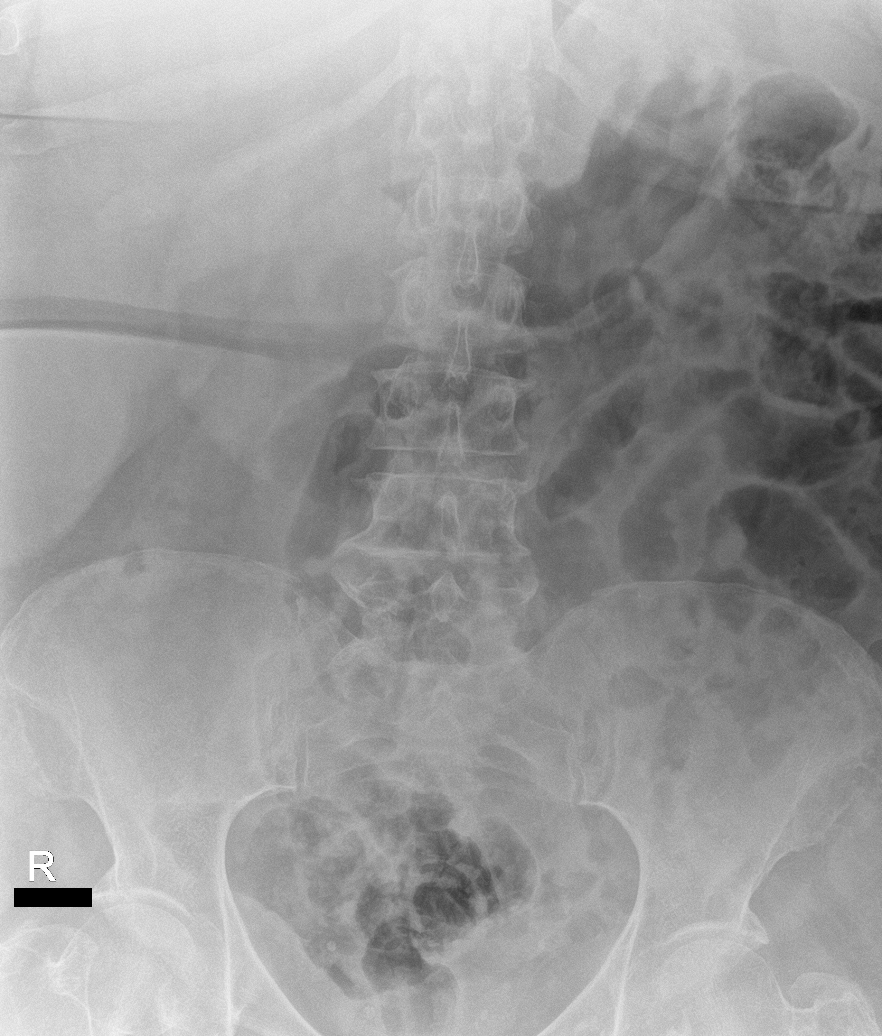

[l-spine obl (1 of 2)]
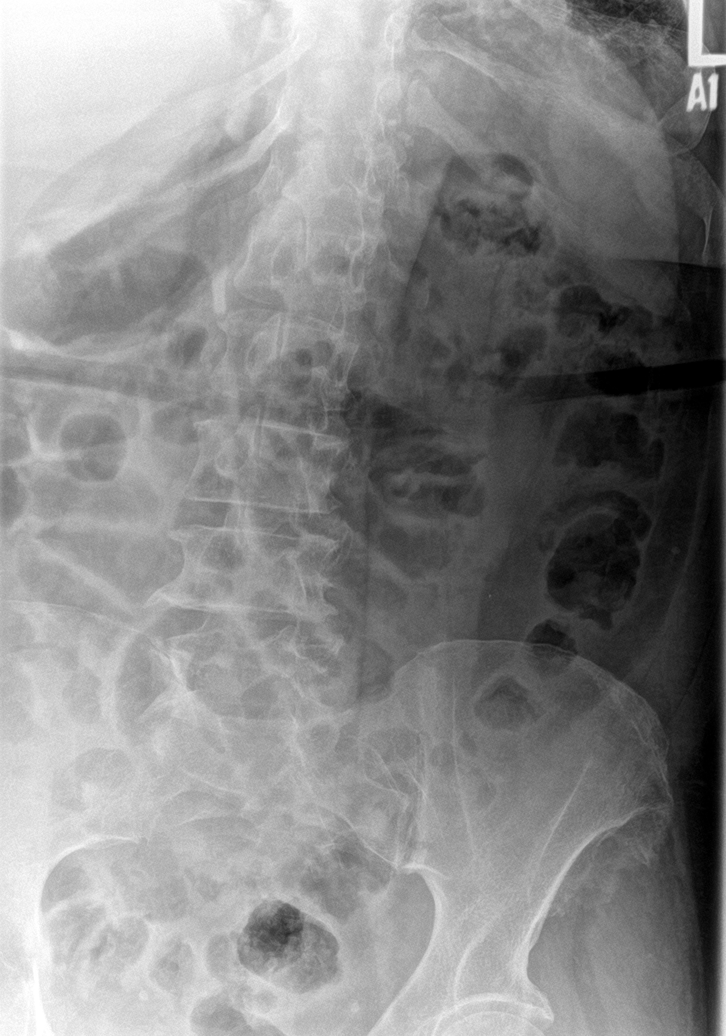

[l-spine obl (2 of 2)]
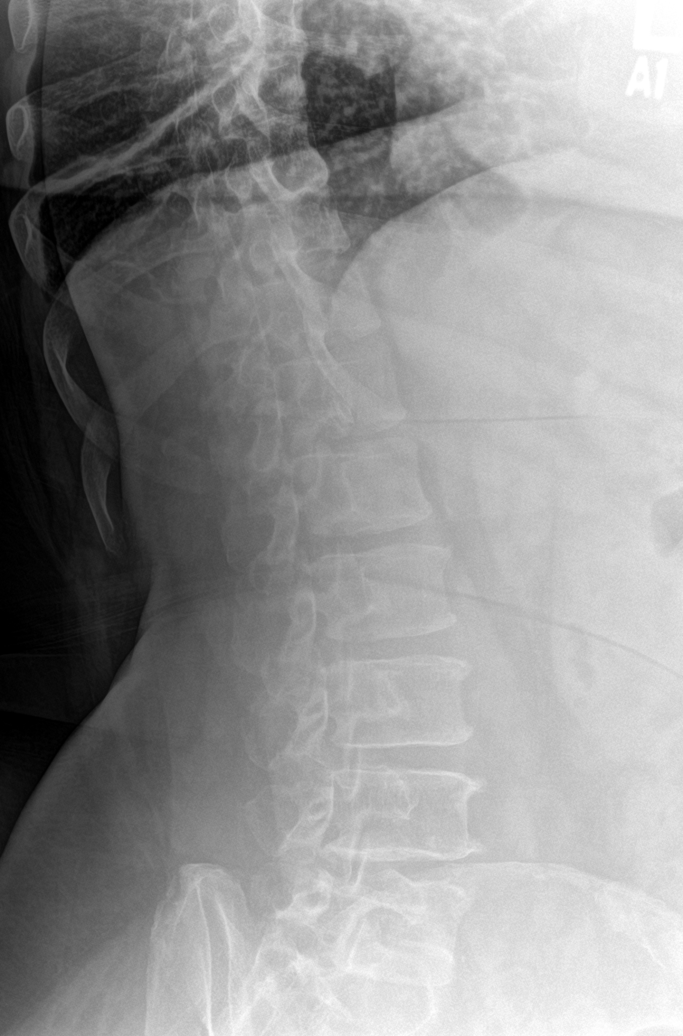

[l-spine lat]
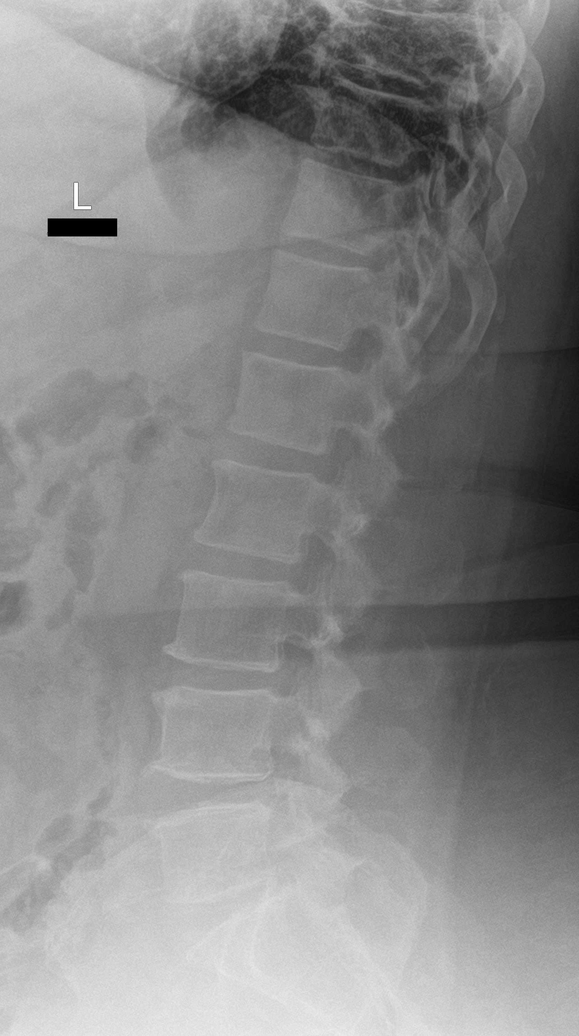

[l-spine spot]
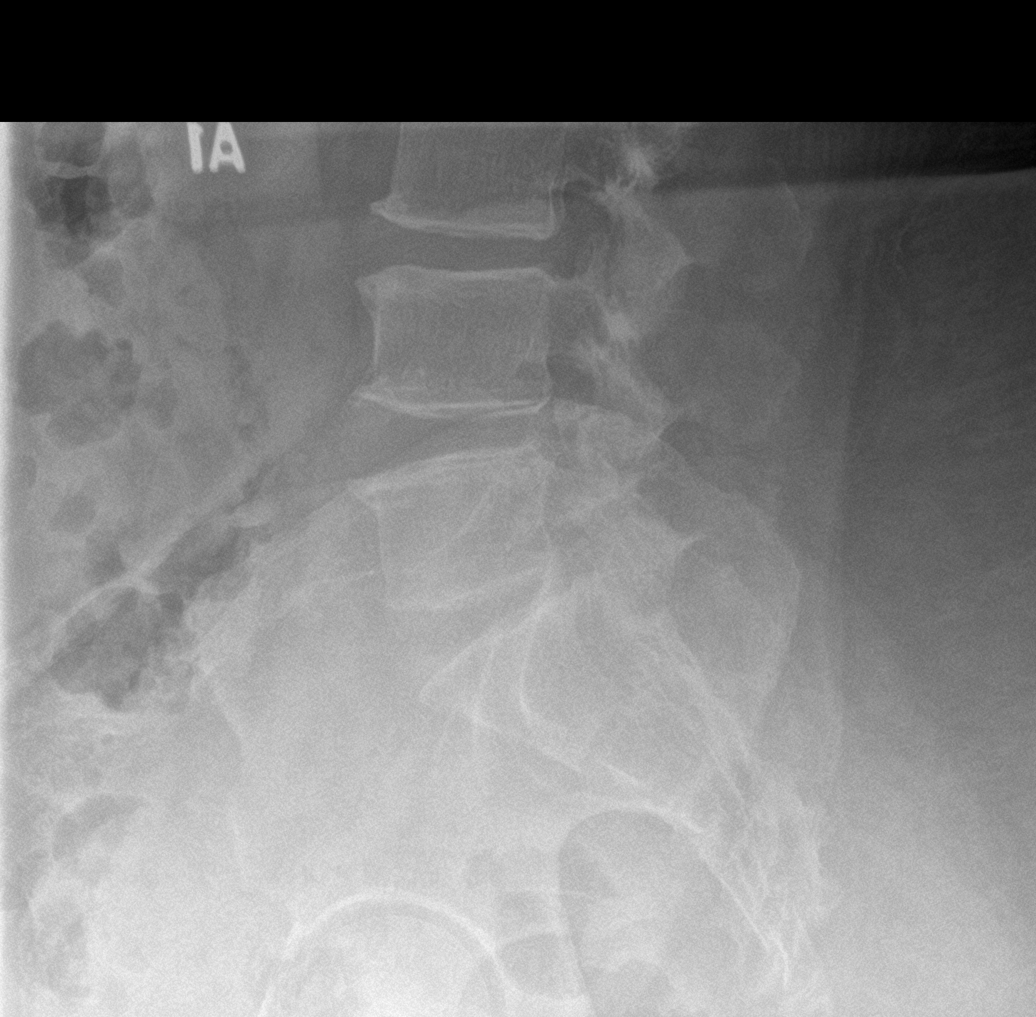

[5 of 5 positions shown; findings below may reference images not displayed]

FINDINGS: Five non rib-bearing lumbar type vertebral bodies are present.
Vertebral body heights and alignment are maintained. Disc spaces are
stable.

No acute fracture traumatic subluxation is present.
IMPRESSION: 1. No acute abnormality.
2. Stable mild degenerative changes.

## 2020-01-09 DIAGNOSIS — Z419 Encounter for procedure for purposes other than remedying health state, unspecified: Secondary | ICD-10-CM | POA: Diagnosis not present

## 2020-01-14 ENCOUNTER — Encounter (HOSPITAL_COMMUNITY): Payer: Self-pay

## 2020-01-14 ENCOUNTER — Emergency Department (HOSPITAL_COMMUNITY)
Admission: EM | Admit: 2020-01-14 | Discharge: 2020-01-14 | Disposition: A | Discharge status: Home / Self Care | Payer: Medicaid Other | Attending: Emergency Medicine | Admitting: Emergency Medicine

## 2020-01-14 ENCOUNTER — Other Ambulatory Visit: Payer: Self-pay

## 2020-01-14 DIAGNOSIS — M7989 Other specified soft tissue disorders: Secondary | ICD-10-CM | POA: Diagnosis not present

## 2020-01-14 DIAGNOSIS — E119 Type 2 diabetes mellitus without complications: Secondary | ICD-10-CM | POA: Insufficient documentation

## 2020-01-14 DIAGNOSIS — J449 Chronic obstructive pulmonary disease, unspecified: Secondary | ICD-10-CM | POA: Diagnosis not present

## 2020-01-14 DIAGNOSIS — Z79899 Other long term (current) drug therapy: Secondary | ICD-10-CM | POA: Diagnosis not present

## 2020-01-14 DIAGNOSIS — Z7984 Long term (current) use of oral hypoglycemic drugs: Secondary | ICD-10-CM | POA: Diagnosis not present

## 2020-01-14 DIAGNOSIS — Z85850 Personal history of malignant neoplasm of thyroid: Secondary | ICD-10-CM | POA: Diagnosis not present

## 2020-01-14 DIAGNOSIS — I110 Hypertensive heart disease with heart failure: Secondary | ICD-10-CM | POA: Insufficient documentation

## 2020-01-14 DIAGNOSIS — Y9289 Other specified places as the place of occurrence of the external cause: Secondary | ICD-10-CM | POA: Diagnosis not present

## 2020-01-14 DIAGNOSIS — S6292XA Unspecified fracture of left wrist and hand, initial encounter for closed fracture: Secondary | ICD-10-CM | POA: Diagnosis not present

## 2020-01-14 DIAGNOSIS — Y9389 Activity, other specified: Secondary | ICD-10-CM | POA: Diagnosis not present

## 2020-01-14 DIAGNOSIS — W010XXA Fall on same level from slipping, tripping and stumbling without subsequent striking against object, initial encounter: Secondary | ICD-10-CM | POA: Insufficient documentation

## 2020-01-14 DIAGNOSIS — I5032 Chronic diastolic (congestive) heart failure: Secondary | ICD-10-CM | POA: Diagnosis not present

## 2020-01-14 DIAGNOSIS — Z87891 Personal history of nicotine dependence: Secondary | ICD-10-CM | POA: Diagnosis not present

## 2020-01-14 DIAGNOSIS — Z7951 Long term (current) use of inhaled steroids: Secondary | ICD-10-CM | POA: Diagnosis not present

## 2020-01-14 DIAGNOSIS — E890 Postprocedural hypothyroidism: Secondary | ICD-10-CM | POA: Diagnosis not present

## 2020-01-14 DIAGNOSIS — S62102A Fracture of unspecified carpal bone, left wrist, initial encounter for closed fracture: Secondary | ICD-10-CM

## 2020-01-14 DIAGNOSIS — S52522A Torus fracture of lower end of left radius, initial encounter for closed fracture: Secondary | ICD-10-CM | POA: Diagnosis not present

## 2020-01-14 MED ORDER — OXYCODONE-ACETAMINOPHEN 5-325 MG PO TABS
2.0000 | ORAL_TABLET | Freq: Once | ORAL | Status: AC
  Administered 2020-01-14: 2 via ORAL
  Filled 2020-01-14: qty 2

## 2020-01-14 NOTE — Progress Notes (Signed)
Orthopedic Tech Progress Note Patient Details:  Shamiyah Ngu 1960-09-18 [id number]  Ortho Devices Type of Ortho Device: Sugartong splint, Arm sling Ortho Device/Splint Location: ULE Ortho Device/Splint Interventions: Application, Ordered   Post Interventions Patient Tolerated: Well Instructions Provided: Care of device   Bita Cartwright A Morine Kohlman 01/14/2020, 8:48 PM

## 2020-01-14 NOTE — Discharge Instructions (Addendum)
Please read instructions below. Keep the splint clean, dry and in place at all times. Elevate your arm as much as possible. You can take your prescribed medications as directed as needed for pain. Dr. Caralyn Guile has been made aware of your visit today and agrees with the recommendations for following up in his clinic. Call the [facility] the next business day to schedule an appointment for repeat xray and follow up on your injury. Return to the ED for concerning symptoms.

## 2020-01-14 NOTE — ED Provider Notes (Signed)
[facility] EMERGENCY DEPARTMENT Provider Note   CSN: [id number] Arrival date & time: 01/14/20  1634     History Chief Complaint  Patient presents with  . Wrist Injury    Atiyana Welte is a 59 y.o. female presenting to the ED with complaint of left wrist fracture.  Patient states she tripped over her shoes when taking the trash out Thursday, fell onto her left arm.  Her arm was tucked under her. No head trauma or LOC.  She has pain to the distal forearm/wrist with associated swelling.  Pain is worse with movement.  She is treated her symptoms with her prescribed oxycodone.  She had outside images done at her PCP office, as noted below. Her home nurse over the phone reports they attempted to make her an appointment for today at [facility], patient is a patient there. However, they did not have any appointments available therefore recommended she come to the ED for splinting - noted that [facility] was on call orthopedist today at [place].  Patient stating she is here for "to the orthopedic doctor to put a cast on my arm.".  The history is provided by the patient and a caregiver.   XRAY RESULTS PER CARE EVERYWHERE:  Barrie Folk, MD - 01/14/2020  Formatting of this note might be different from the original.  TECHNIQUE: Left wrist 3 views.  INDICATION: Injury.  FINDINGS: There is a small cortical buckle on the lateral aspect of the distal radial metaphysis.Similar mild buckle present along the dorsal aspect at the same level. Remaining bones and joints are normal. Soft tissues are normal.  IMPRESSION: Fracture distal radial metadiaphysis. No significant displacement or angulation.  Electronically Signed by: Delma Post     Past Medical History:  Diagnosis Date  . Anxiety   . Arthritis   . Asthma   . Chronic diastolic CHF (congestive heart failure) ([place])   . COPD (chronic obstructive pulmonary disease) (HCC)    Uses Oxygen at night  .  Depression   . Diabetes mellitus without complication ([place])   . Dyspnea    occ  . Family history of thyroid cancer   . GERD (gastroesophageal reflux disease)   . Gout   . Headache    migraines  . History of DVT of lower extremity   . History of nuclear stress test    Myoview 2/17:  Low risk stress nuclear study with a small, moderate intensity, partially reversible inferior lateral defect consistent with small prior infarct and minimal peri-infarct ischemia; EF 68 with normal wall motion  . Hypertension   . Pneumonia   . Thyroid cancer ([place]) 09/23/2016    Patient Active Problem List   Diagnosis Date Noted  . Morbid obesity with BMI of 50.0-59.9, adult ([place])   . Fall at home, initial encounter 06/23/2018  . HNP (herniated nucleus pulposus), thoracic 06/23/2018  . CHF (congestive heart failure) ([place]) 06/18/2018  . Hypertensive disorder 08/28/2017  . Obesity 08/28/2017  . COPD exacerbation ([place]) 08/04/2017  . Acute and chronic respiratory failure with hypercapnia ([place])   . Pulmonary HTN ([place])   . Genetic testing 02/27/2017  . Family history of thyroid cancer   . Hypomagnesemia 10/03/2016  . Atypical chest pain   . HLD (hyperlipidemia) 09/23/2016  . Gout 09/23/2016  . DVT (deep vein thrombosis) in pregnancy 09/23/2016  . Thyroid cancer ([place]) 09/23/2016  . Hypocalcemia 09/23/2016  . AKI (acute kidney injury) ([place]) 09/23/2016  . Acute pulmonary edema (HCC)   .  Acute hypoxemic respiratory failure ([place])   . Ventilator dependent ([place])   . Chronic respiratory failure with hypoxia and hypercapnia ([place]) 07/16/2016  . CAP (community acquired pneumonia) 01/23/2016  . 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 Pulmonary hypertension ([place]) 01/23/2016  . Obesity hypoventilation syndrome ([place]) 08/26/2015  . Dyslipidemia associated with type 2 diabetes mellitus ([place]) 04/24/2015  . Leukocytosis 04/24/2015  . Controlled type 2 diabetes mellitus without complication,  without long-term current use of insulin ([place]) 04/24/2015  . Anxiety 04/24/2015  . Benign essential HTN 04/24/2015  . Normocytic anemia 04/24/2015  . OSA (obstructive sleep apnea) 05/10/2012  . Tobacco abuse 07/04/2011    Past Surgical History:  Procedure Laterality Date  . BUNIONECTOMY Bilateral   . CARDIAC CATHETERIZATION N/A 06/23/2015   Procedure: Right/Left Heart Cath and Coronary Angiography;  Surgeon: [name], MD;  Location: [facility];  Service: Cardiovascular;  Laterality: N/A;  . COLONOSCOPY WITH PROPOFOL N/A 12/15/2015   Procedure: COLONOSCOPY WITH PROPOFOL;  Surgeon: [name], MD;  Location: [facility];  Service: Endoscopy;  Laterality: N/A;  . RIGHT HEART CATH N/A 10/01/2019   Procedure: RIGHT HEART CATH;  Surgeon: [name], MD;  Location: [facility];  Service: Cardiovascular;  Laterality: N/A;  . THYROIDECTOMY  09/19/2016  . THYROIDECTOMY N/A 09/19/2016   Procedure: TOTAL THYROIDECTOMY;  Surgeon: [name], MD;  Location: [facility];  Service: General;  Laterality: N/A;  . TONSILLECTOMY    . TOTAL ABDOMINAL HYSTERECTOMY  07/14/10     OB History   No obstetric history on file.     Family History  Problem Relation Age of Onset  . Cancer Father        thought to be due to exposure to concrete  . Diabetes Mother   . Thyroid cancer Mother        dx in her 29s-60s  . Cancer Maternal Uncle        2 uncles with cancer NOS  . Brain cancer Paternal Aunt   . Cancer Cousin        maternal first cousin - NOS  . Cancer Cousin        maternal first cousin - NOS    Social History   Tobacco Use  . Smoking status: Former Smoker    Packs/day: 0.50    Years: 41.00    Pack years: 20.50    Types: Cigarettes    Quit date: 07/2016    Years since quitting: 3.5  . Smokeless tobacco: Never Used  . Tobacco comment: Starting to Wean Off -- using Nicotine Patch  Vaping Use  . Vaping Use: Never used  Substance Use Topics  . Alcohol use: Not  Currently    Alcohol/week: 0.0 standard drinks    Comment: ? none now  . Drug use: Never    Comment: none for long time    Home Medications Prior to Admission medications   Medication Sig Start Date End Date Taking? Authorizing Provider  albuterol (PROVENTIL HFA;VENTOLIN HFA) 108 (90 Base) MCG/ACT inhaler Inhale 1-2 puffs into the lungs every 6 (six) hours as needed for wheezing or shortness of breath. 04/23/18  Yes Long, [name], MD  allopurinol (ZYLOPRIM) 100 MG tablet Take 100 mg by mouth daily.   Yes [provider]  ANORO ELLIPTA 62.5-25 MCG/INH AEPB Inhale 1 puff into the lungs daily.  04/11/18  Yes [provider]  calcitRIOL (ROCALTROL) 0.5 MCG capsule Take 1 capsule (0.5  mcg total) by mouth daily. 09/26/16  Yes Hongalgi, [name], MD  carvedilol (COREG) 6.25 MG tablet Take 1 tablet (6.25 mg total) by mouth 2 (two) times daily with a meal. 01/23/18  Yes Bensimhon, [name], MD  clonazePAM (KLONOPIN) 2 MG tablet Take 1 tablet (2 mg total) by mouth 3 (three) times daily. for anxiety 06/23/19  Yes [name], MD  cyclobenzaprine (FLEXERIL) 10 MG tablet Take 10 mg by mouth 3 (three) times daily. 11/19/19  Yes [provider]  diclofenac sodium (VOLTAREN) 1 % GEL Apply 2 g topically 4 (four) times daily as needed (for pain). 06/19/18  Yes [name], MD  dicyclomine (BENTYL) 20 MG tablet Take 1 tablet (20 mg total) by mouth every 6 (six) hours as needed for spasms. 07/11/19  Yes Virgel Manifold, MD  EPINEPHrine 0.3 mg/0.3 mL IJ SOAJ injection Inject 0.3 mg into the muscle daily as needed (allergic reaction).   Yes [provider]  FEROSUL 325 (65 Fe) MG tablet Take 325 mg by mouth daily. 03/22/17  Yes [provider]  furosemide (LASIX) 20 MG tablet furosemide 20 mg tablet  TAKE TWO TABLETS BY MOUTH IN THE MORNING AND 1 TABLET IN THE EVENING   Yes [provider]  HYDROcodone-acetaminophen (NORCO/VICODIN) 5-325 MG tablet Take 1 tablet  by mouth every 6 (six) hours as needed for moderate pain.  02/12/18  Yes [provider]  hydrOXYzine (ATARAX/VISTARIL) 25 MG tablet Take 1 tablet (25 mg total) by mouth every 6 (six) hours. 07/08/19  Yes Caccavale, Sophia, PA-C  benzonatate (TESSALON) 200 MG capsule Take 200 mg by mouth 3 (three) times daily. Patient not taking: Reported on 01/14/2020 10/28/19   [provider]  Blood Glucose Monitoring Suppl (ACCU-CHEK AVIVA PLUS) w/Device KIT 1 each by Other route daily.  08/07/17   [provider]  butalbital-acetaminophen-caffeine (FIORICET, ESGIC) 50-325-40 MG tablet Take 1-2 tablets by mouth every 4 (four) hours as needed for migraine. Patient not taking: Reported on 01/14/2020 06/13/18   [provider]  cetirizine (ZYRTEC) 10 MG tablet Take 10 mg by mouth daily. 07/10/17   [provider]  glucose blood test strip Use as instructed to check blood sugar up to 4 times daily Patient taking differently: 1 each by Other route See admin instructions. Use as instructed to check blood sugar up to 4 times daily 08/06/17   [name], DO  guaiFENesin (MUCINEX) 600 MG 12 hr tablet Take 2 tablets (1,200 mg total) by mouth 2 (two) times daily. 07/11/18   Florencia Reasons, MD  ipratropium-albuterol (DUONEB) 0.5-2.5 (3) MG/3ML SOLN Take 3 mLs by nebulization 4 (four) times daily.  04/05/17   [provider]  levothyroxine (SYNTHROID, LEVOTHROID) 112 MCG tablet Take 224 mcg by mouth daily. 07/11/17   [provider]  lidocaine (LIDODERM) 5 % Place 1 patch onto the skin daily. Remove & Discard patch within 12 hours or as directed by MD 07/11/18   Florencia Reasons, MD  lisinopril (PRINIVIL,ZESTRIL) 5 MG tablet Take 1 tablet (5 mg total) by mouth daily. Please call for office visit [phone] 08/15/17   [name], MD  loperamide (IMODIUM) 2 MG capsule Take 1 capsule (2 mg total) by mouth 4 (four) times daily as needed for diarrhea or loose stools. 08/10/75   [name],  MD  metFORMIN (GLUCOPHAGE) 500 MG tablet Take 1 tablet (500 mg total) by mouth 2 (two) times daily with a meal. 12/11/14   [name], MD  methocarbamol (ROBAXIN) 500 MG tablet Take 1 tablet (500 mg total) by mouth every 8 (eight) hours as needed for muscle spasms. 06/19/18   [name], MD  metoCLOPramide (REGLAN) 10 MG tablet Take 1 tablet (10 mg total) by mouth every 6 (six) hours as needed for nausea (or headache). 5/85/27   [name], MD  metoprolol succinate (TOPROL-XL) 25 MG 24 hr tablet Take 25 mg by mouth in the morning.  08/09/18   [provider]  montelukast (SINGULAIR) 10 MG tablet Take 1 tablet (10 mg total) by mouth at bedtime. 07/11/18   Florencia Reasons, MD  nitroGLYCERIN (NITROSTAT) 0.4 MG SL tablet Place 1 tablet (0.4 mg total) under the tongue every 5 (five) minutes as needed for chest pain. 06/19/18   [name], MD  nystatin cream (MYCOSTATIN) Apply 1 application topically 2 (two) times daily as needed for dry skin.  04/13/17   [provider]  omeprazole (PRILOSEC) 20 MG capsule Take by mouth. 11/19/19   [provider]  omeprazole (PRILOSEC) 40 MG capsule Take 40 mg by mouth daily. 04/13/15   [provider]  oxyCODONE-acetaminophen (PERCOCET) 10-325 MG tablet Take 1 tablet by mouth every 6 (six) hours. 12/03/19   [provider]  OXYGEN Inhale 2 L into the lungs at bedtime.    [provider]  potassium chloride SA (K-DUR,KLOR-CON) 20 MEQ tablet Take 1 tablet (20 mEq total) by mouth 2 (two) times daily. 07/24/18   [name], MD  pravastatin (PRAVACHOL) 40 MG tablet Take 40 mg by mouth every evening.  03/29/15   [provider]  predniSONE (DELTASONE) 20 MG tablet 3 tabs po day one, then 2 po daily x 4 days 06/26/19   Mesner, Corene Cornea, MD  QC STOOL SOFTENER PLS LAXATIVE 8.6-50 MG tablet Take 1 tablet by mouth at bedtime. 04/11/17   [provider]  rosuvastatin (CRESTOR) 10 MG tablet SMARTSIG:1 Tablet(s)  By Mouth Every Evening 11/19/19   [provider]  tiotropium (SPIRIVA) 18 MCG inhalation capsule Place 18 mcg into inhaler and inhale daily.    [provider]  torsemide (DEMADEX) 20 MG tablet TAKE 4 (FOUR) TABLETS BY MOUTH DAILY (2AM+ 2NOON) 11/19/19   [name], MD  traZODone (DESYREL) 50 MG tablet Take 100 mg by mouth at bedtime. 11/19/19   [provider]    Allergies    Bee venom, Fioricet [butalbital-apap-caffeine], Ibuprofen, Lamisil [terbinafine], and Nsaids  Review of Systems   Review of Systems  Musculoskeletal: Positive for arthralgias and joint swelling.  Neurological: Negative for numbness.  All other systems reviewed and are negative.   Physical Exam Updated Vital Signs BP (!) 116/54 (BP Location: Right Arm)   Pulse (!) 106   Temp 98.2 F (36.8 C) (Oral)   Resp 16   SpO2 94%   Physical Exam Vitals and nursing note reviewed.  Constitutional:      Appearance: She is well-developed. She is obese.  HENT:     Head: Normocephalic and atraumatic.  Eyes:     Conjunctiva/sclera: Conjunctivae normal.  Cardiovascular:     Rate and Rhythm: Normal rate.  Pulmonary:     Effort: Pulmonary effort is normal.     Comments: On O2 [place], chronic Musculoskeletal:     Comments: Left wrist with swelling and slight bruising present. No deformity. She has generalized tenderness to the wrist/distal forearm including to the anatomical snuffbox.  ROM deferred given known diagnosis. Normal sensation to digits. Intact distal  pulse. Elbow is nontender, nl ROM.  No wounds  Neurological:     Mental Status: She is alert.  Psychiatric:        Mood and Affect: Mood normal.        Behavior: Behavior normal.     ED Results / Procedures / Treatments   Labs (all labs ordered are listed, but only abnormal results are displayed) Labs Reviewed - No data to display  EKG None  Radiology No results found.  Procedures Procedures (including critical care  time)  Medications Ordered in ED Medications  oxyCODONE-acetaminophen (PERCOCET/ROXICET) 5-325 MG per tablet 2 tablet (2 tablets Oral Given 01/14/20 1947)    ED Course  I have reviewed the triage vital signs and the nursing notes.  Pertinent labs & imaging results that were available during my care of the patient were reviewed by me and considered in my medical decision making (see chart for details).    MDM Rules/Calculators/A&P                          Patient presenting with left wrist fracture, diagnosed by outside films after mechanical fall on Friday.  No head trauma or LOC.  Left wrist with closed fracture of the distal radial metadiaphysis, mild cortical buckle fracture.  Neurovascularly intact.  No wounds.  Was unable to be seen outpatient at [facility] today therefore was sent to the ED for splinting.  Patient is initially demanding to see orthopedic specialist on-call, however it appears there was some miscommunication/misinterpretation about her visit to the ED today.  Explained that the orthopedist will be made aware of her visit today and our orthopedic technicians will be applying the splinting. Also explained to her that although we have specialists on-call, they are not always on site at all times. Messaged Dr. Caralyn Guile regarding patient's visit, he agreed with plan for splinting and outpatient follow up.  Ordered sugar tong with thumb spica, given patient's tenderness to the snuffbox, however patient refused thumb spica.  Patient discharged with symptomatic management and contact information to hand specialist.  Final Clinical Impression(s) / ED Diagnoses Final diagnoses:  Closed fracture of left wrist, initial encounter    Rx / DC Orders ED Discharge Orders    None       [name], [place] N, PA-C 01/14/20 2022    Hayden Rasmussen, MD 01/15/20 1029

## 2020-01-14 NOTE — Social Work (Signed)
CSW provided Pt with transportation to home via [employer].

## 2020-01-14 NOTE — ED Triage Notes (Signed)
Pt fell last week and broke her left arm, sent here by PCP for ortho consult

## 2020-01-16 DIAGNOSIS — Z23 Encounter for immunization: Secondary | ICD-10-CM | POA: Diagnosis not present

## 2020-01-20 DIAGNOSIS — M79602 Pain in left arm: Secondary | ICD-10-CM | POA: Diagnosis not present

## 2020-01-20 DIAGNOSIS — M25562 Pain in left knee: Secondary | ICD-10-CM | POA: Diagnosis not present

## 2020-02-09 DIAGNOSIS — Z419 Encounter for procedure for purposes other than remedying health state, unspecified: Secondary | ICD-10-CM | POA: Diagnosis not present

## 2020-02-10 IMAGING — CT CT ABD-PELV W/ CM
2 of 5 series · 16 of 46 positions shown, 18 images · IV contrast (isovue)
Comparison: 11/28/2015

CLINICAL DATA: Abdominal pain and diarrhea for 3 days.

EXAM:
CT ABDOMEN AND PELVIS WITH CONTRAST
TECHNIQUE: Multidetector CT imaging of the abdomen and pelvis was performed
using the standard protocol following bolus administration of
intravenous contrast.
CONTRAST:  100 mL Isovue 300

[Series 2: axial st · axial · 0.91mm/px · z∈[-398,-8]mm · 13 of 92 slices shown, 15 images]
[im 7/92  soft-tissue]
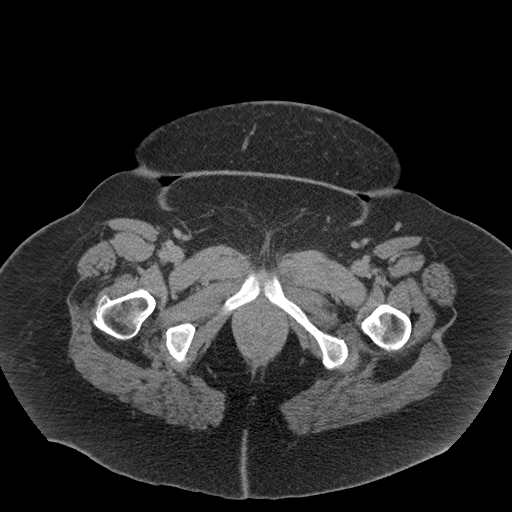
[im 7/92  bone]
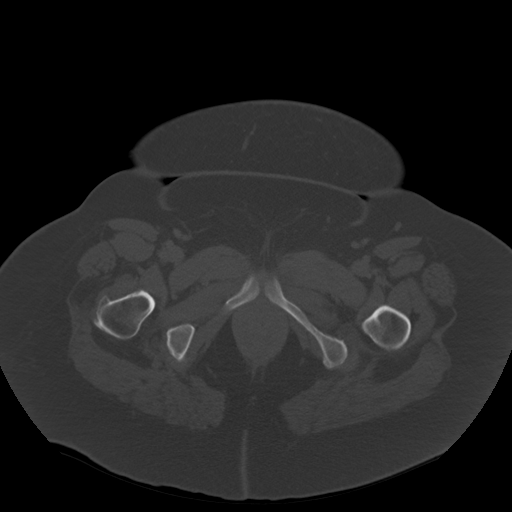
[im 14/92  soft-tissue]
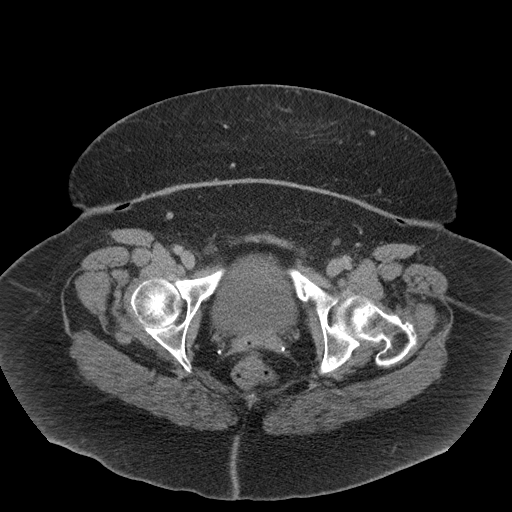
[im 20/92  soft-tissue]
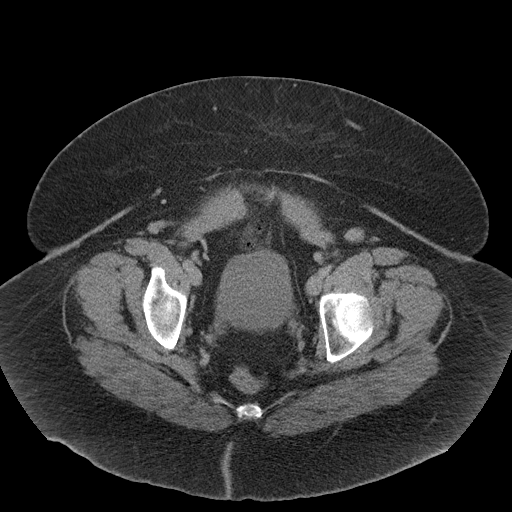
[im 27/92  soft-tissue]
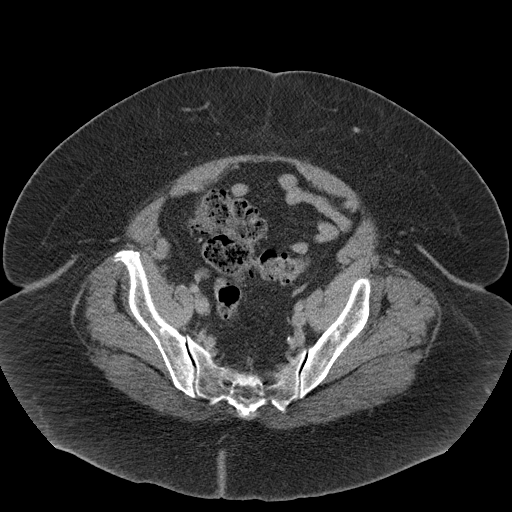
[im 33/92  soft-tissue]
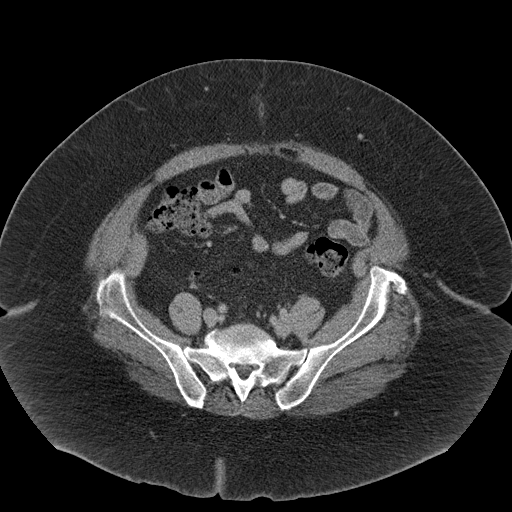
[im 40/92  soft-tissue]
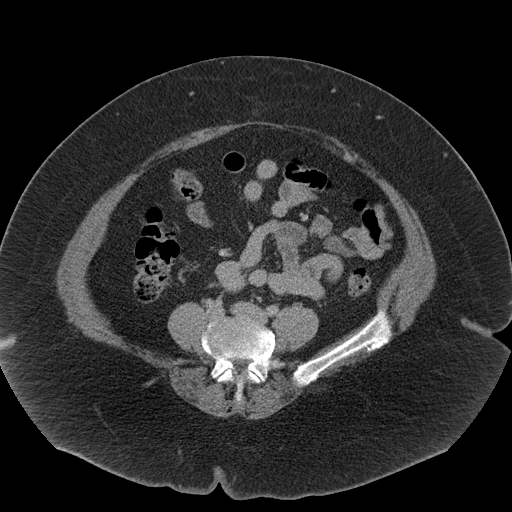
[im 46/92  soft-tissue]
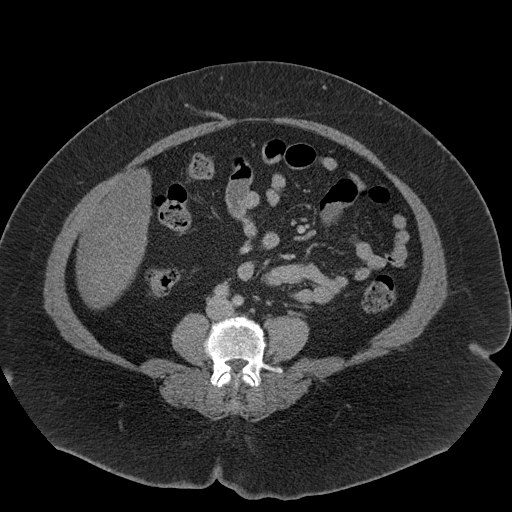
[im 53/92  soft-tissue]
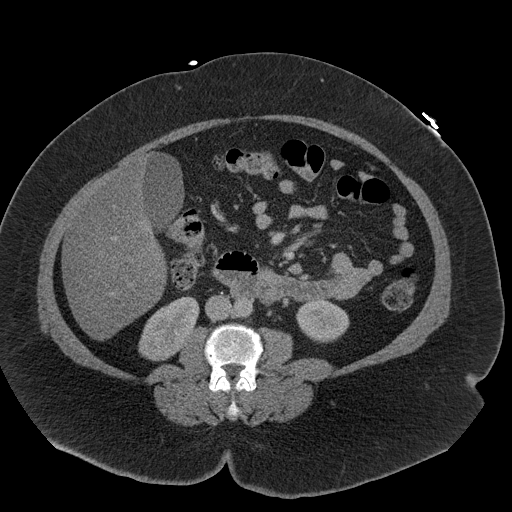
[im 59/92  soft-tissue]
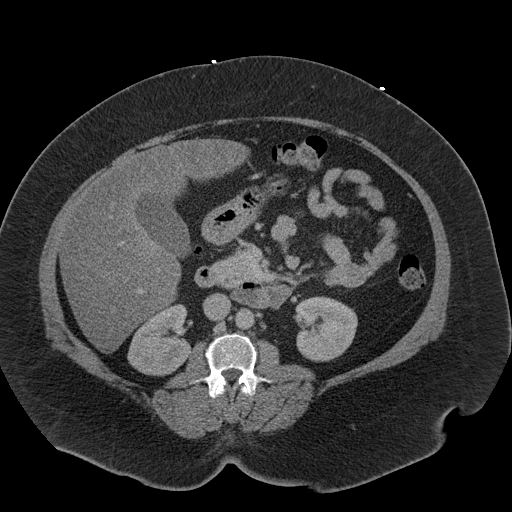
[im 59/92  bone]
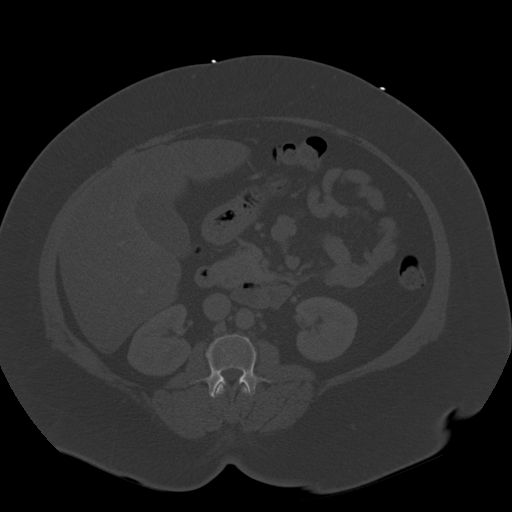
[im 66/92  soft-tissue]
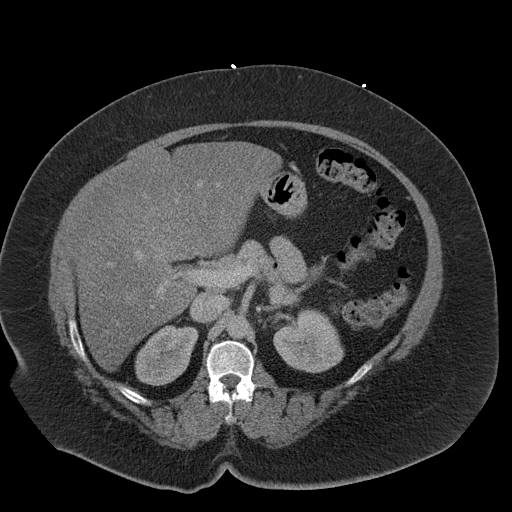
[im 72/92  soft-tissue]
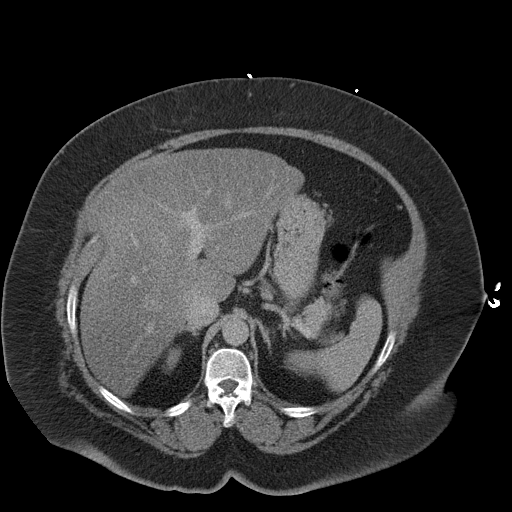
[im 79/92  soft-tissue]
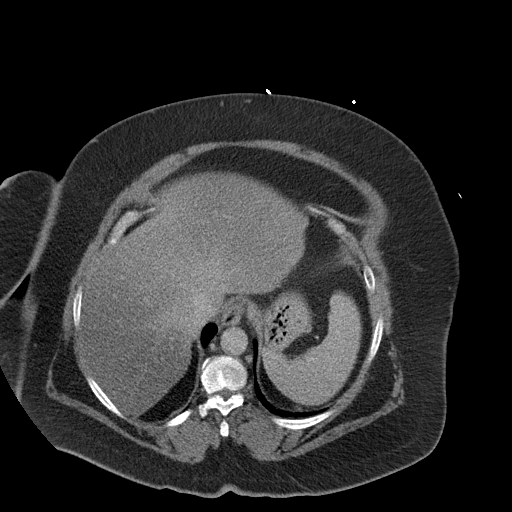
[im 85/92  soft-tissue]
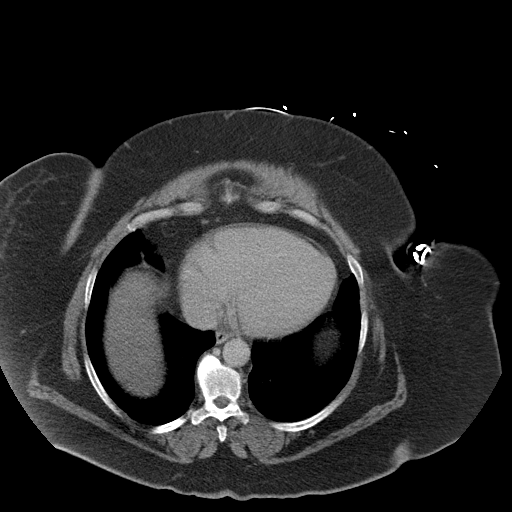

[Series 4: coronal st · coronal · 0.89mm/px · 3 of 134 slices shown]
[im 45/134  soft-tissue]
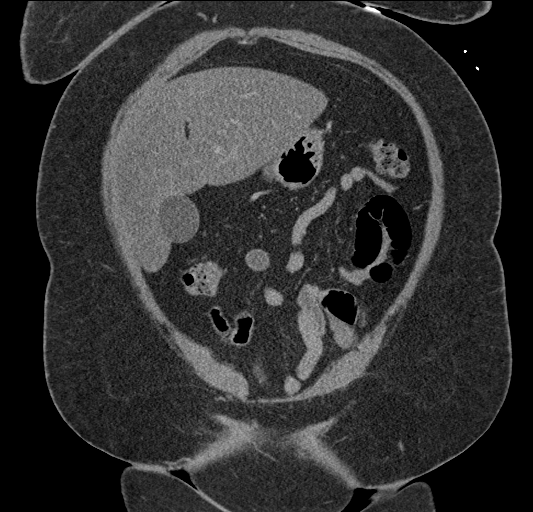
[im 60/134  soft-tissue]
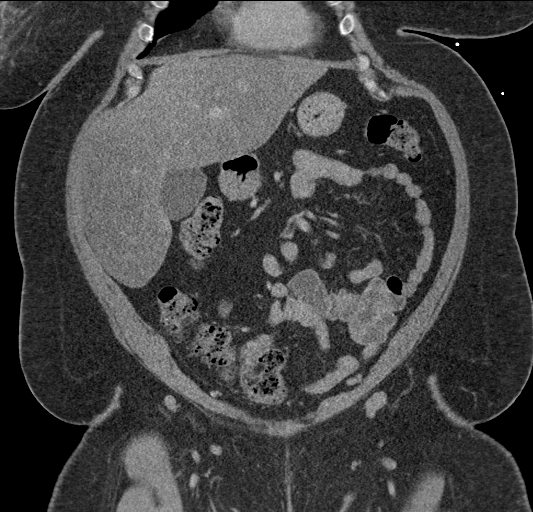
[im 74/134  soft-tissue]
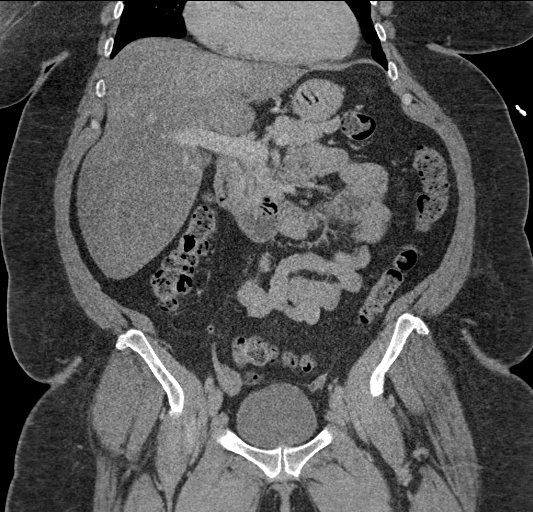

[16 of 46 positions shown; findings below may reference images not displayed]

FINDINGS: Lower chest: Mild atelectasis and/or scarring in the lung bases. No
pleural effusion.

Hepatobiliary: Diffusely decreased attenuation of the liver
consistent with steatosis. No focal liver abnormality identified.
Unremarkable gallbladder. No biliary dilatation.

Pancreas: Unremarkable.

Spleen: Unremarkable.

Adrenals/Urinary Tract: Unremarkable adrenal glands. No evidence of
renal mass, calculi, or hydronephrosis. Unremarkable bladder.

Stomach/Bowel: The stomach is within normal limits. There is no
evidence of bowel obstruction or inflammation. The appendix is
unremarkable.

Vascular/Lymphatic: Mild abdominal aortic atherosclerosis without
aneurysm. No enlarged lymph nodes.

Reproductive: Prior hysterectomy. Unchanged 1.9 cm right ovarian
cyst. Unremarkable left ovary.

Other: No intraperitoneal free fluid.  No abdominal wall hernia.

Musculoskeletal: No acute osseous abnormality or suspicious osseous
lesion. Lower thoracic and lower lumbar spondylosis.
IMPRESSION: 1. No acute abnormality identified in the abdomen or pelvis.
2. Hepatic steatosis.
3.  Aortic Atherosclerosis ([id number]).

## 2020-02-12 ENCOUNTER — Other Ambulatory Visit: Payer: Self-pay | Admitting: Internal Medicine

## 2020-02-12 DIAGNOSIS — Z1231 Encounter for screening mammogram for malignant neoplasm of breast: Secondary | ICD-10-CM

## 2020-02-14 ENCOUNTER — Emergency Department (HOSPITAL_COMMUNITY): Payer: Medicaid Other

## 2020-02-14 ENCOUNTER — Emergency Department (HOSPITAL_COMMUNITY)
Admission: EM | Admit: 2020-02-14 | Discharge: 2020-02-15 | Disposition: A | Discharge status: Home / Self Care | Payer: Medicaid Other | Attending: Emergency Medicine | Admitting: Emergency Medicine

## 2020-02-14 DIAGNOSIS — Z79899 Other long term (current) drug therapy: Secondary | ICD-10-CM | POA: Insufficient documentation

## 2020-02-14 DIAGNOSIS — Z85850 Personal history of malignant neoplasm of thyroid: Secondary | ICD-10-CM | POA: Insufficient documentation

## 2020-02-14 DIAGNOSIS — E785 Hyperlipidemia, unspecified: Secondary | ICD-10-CM | POA: Insufficient documentation

## 2020-02-14 DIAGNOSIS — J441 Chronic obstructive pulmonary disease with (acute) exacerbation: Secondary | ICD-10-CM | POA: Diagnosis not present

## 2020-02-14 DIAGNOSIS — Z7984 Long term (current) use of oral hypoglycemic drugs: Secondary | ICD-10-CM | POA: Diagnosis not present

## 2020-02-14 DIAGNOSIS — Z7951 Long term (current) use of inhaled steroids: Secondary | ICD-10-CM | POA: Insufficient documentation

## 2020-02-14 DIAGNOSIS — E1169 Type 2 diabetes mellitus with other specified complication: Secondary | ICD-10-CM | POA: Insufficient documentation

## 2020-02-14 DIAGNOSIS — J45909 Unspecified asthma, uncomplicated: Secondary | ICD-10-CM | POA: Diagnosis not present

## 2020-02-14 DIAGNOSIS — Z87891 Personal history of nicotine dependence: Secondary | ICD-10-CM | POA: Insufficient documentation

## 2020-02-14 DIAGNOSIS — I5032 Chronic diastolic (congestive) heart failure: Secondary | ICD-10-CM | POA: Diagnosis not present

## 2020-02-14 DIAGNOSIS — I471 Supraventricular tachycardia: Secondary | ICD-10-CM | POA: Diagnosis not present

## 2020-02-14 DIAGNOSIS — R079 Chest pain, unspecified: Secondary | ICD-10-CM | POA: Diagnosis not present

## 2020-02-14 DIAGNOSIS — J90 Pleural effusion, not elsewhere classified: Secondary | ICD-10-CM | POA: Diagnosis not present

## 2020-02-14 DIAGNOSIS — Z743 Need for continuous supervision: Secondary | ICD-10-CM | POA: Diagnosis not present

## 2020-02-14 DIAGNOSIS — I110 Hypertensive heart disease with heart failure: Secondary | ICD-10-CM | POA: Insufficient documentation

## 2020-02-14 LAB — CBC
HCT: 38.9 % (ref 36.0–46.0)
Hemoglobin: 11.9 g/dL — ABNORMAL LOW (ref 12.0–15.0)
MCH: 29 pg (ref 26.0–34.0)
MCHC: 30.6 g/dL (ref 30.0–36.0)
MCV: 94.9 fL (ref 80.0–100.0)
Platelets: 305 10*3/uL (ref 150–400)
RBC: 4.1 MIL/uL (ref 3.87–5.11)
RDW: 13 % (ref 11.5–15.5)
WBC: 16 10*3/uL — ABNORMAL HIGH (ref 4.0–10.5)
nRBC: 0 % (ref 0.0–0.2)

## 2020-02-14 NOTE — ED Triage Notes (Signed)
To triage via EMS for chest pain.  Pt was in SVT, walked to EMS truck and rhythm converted.  Still with c/o chest pain.  EMS BP 112/76 HR 106 SpO2 98% on O2 @ 3L via [place].

## 2020-02-15 DIAGNOSIS — R079 Chest pain, unspecified: Secondary | ICD-10-CM | POA: Diagnosis not present

## 2020-02-15 DIAGNOSIS — J90 Pleural effusion, not elsewhere classified: Secondary | ICD-10-CM | POA: Diagnosis not present

## 2020-02-15 LAB — BASIC METABOLIC PANEL
Anion gap: 13 (ref 5–15)
BUN: 13 mg/dL (ref 6–20)
CO2: 34 mmol/L — ABNORMAL HIGH (ref 22–32)
Calcium: 9.2 mg/dL (ref 8.9–10.3)
Chloride: 92 mmol/L — ABNORMAL LOW (ref 98–111)
Creatinine, Ser: 0.73 mg/dL (ref 0.44–1.00)
GFR, Estimated: >60 mL/min (ref >60)
Glucose, Bld: 115 mg/dL — ABNORMAL HIGH (ref 70–99)
Potassium: 4 mmol/L (ref 3.5–5.1)
Sodium: 139 mmol/L (ref 135–145)

## 2020-02-15 LAB — TROPONIN I (HIGH SENSITIVITY)
Troponin I (High Sensitivity): 18 ng/L — ABNORMAL HIGH (ref <18)
Troponin I (High Sensitivity): 13 ng/L (ref <18)
Troponin I (High Sensitivity): 9 ng/L (ref <18)

## 2020-02-15 LAB — BRAIN NATRIURETIC PEPTIDE: B Natriuretic Peptide: 30.2 pg/mL (ref 0.0–100.0)

## 2020-02-15 LAB — TSH: TSH: 0.619 u[IU]/mL (ref 0.350–4.500)

## 2020-02-15 MED ORDER — CLONAZEPAM 0.5 MG PO TABS
2.0000 mg | ORAL_TABLET | Freq: Once | ORAL | Status: AC
  Administered 2020-02-15: 2 mg via ORAL
  Filled 2020-02-15: qty 4

## 2020-02-15 MED ORDER — FUROSEMIDE 20 MG PO TABS
40.0000 mg | ORAL_TABLET | Freq: Once | ORAL | Status: AC
  Administered 2020-02-15: 40 mg via ORAL
  Filled 2020-02-15: qty 2

## 2020-02-15 MED ORDER — OXYCODONE-ACETAMINOPHEN 10-325 MG PO TABS: 1.0000 | ORAL_TABLET | Freq: Once | ORAL | Status: DC

## 2020-02-15 MED ORDER — LEVOTHYROXINE SODIUM 112 MCG PO TABS
224.0000 ug | ORAL_TABLET | Freq: Once | ORAL | Status: AC
  Administered 2020-02-15: 224 ug via ORAL
  Filled 2020-02-15: qty 2

## 2020-02-15 MED ORDER — OXYCODONE HCL 5 MG PO TABS
5.0000 mg | ORAL_TABLET | Freq: Once | ORAL | Status: AC
  Administered 2020-02-15: 5 mg via ORAL
  Filled 2020-02-15: qty 1

## 2020-02-15 MED ORDER — CARVEDILOL 3.125 MG PO TABS
6.2500 mg | ORAL_TABLET | Freq: Two times a day (BID) | ORAL | Status: DC
  Administered 2020-02-15: 6.25 mg via ORAL
  Filled 2020-02-15: qty 2

## 2020-02-15 MED ORDER — OXYCODONE-ACETAMINOPHEN 5-325 MG PO TABS
1.0000 | ORAL_TABLET | Freq: Once | ORAL | Status: AC
  Administered 2020-02-15: 1 via ORAL
  Filled 2020-02-15: qty 1

## 2020-02-15 NOTE — ED Notes (Addendum)
Pt d/c home per MD order. Discharge summary reviewed with pt- pt verbalizes understanding. No s/s of acute distress noted. Case management consulted to arrange discharge ride home via taxi. Off unit via WC.

## 2020-02-15 NOTE — ED Notes (Signed)
Pt began a verbal altercation with another pt over who had been waiting longer due to the other pt being called. Pt continued to make disrespectful comment. This Probation officer informed pt that the comments were uncalled for. Pt asked why she got to go back before her. This Probation officer informed pt that that is not a question I am able to answer and that her care is confidential just like her care is. Pt stated "You don't know who you're talking to." Pt continues to curse about staff stating, "I want her to shut her stupid mouth."

## 2020-02-15 NOTE — Discharge Instructions (Signed)
Your EKG today showed you were in a normal rhythm but before you got here you were in SVT.  Sometimes people just will go in this rhythm spontaneously but it is not dangerous.  However if the rapid heart rate does not get better on it's own then you need to come to the hospital.  You labs today showed your heart looks good and no signs of congestive heart failure or pneumonia.  Thyroid looks good today. Follow up with your cardiologist and your regular doctor.

## 2020-02-15 NOTE — ED Provider Notes (Signed)
[facility] EMERGENCY DEPARTMENT Provider Note   CSN: [id number] Arrival date & time: 02/14/20  2221     History Chief Complaint  Patient presents with   Chest Pain    Michele Owens is a 59 y.o. female.  Is a 59 year old female with a history of morbid obesity, COPD on 2 L of oxygen chronically, diastolic CHF, history of DVT, hypertension, thyroid cancer now on Synthroid, migraines, OSA and diabetes who is on multiple medications presenting today initially with EMS for palpitations.  Patient has unfortunately been waiting for 11 hours prior to being seen and now complaints have changed.  However patient reports that yesterday she went to the grocery store that was around 3:00 and when she got home she carried in an entire package of water and she set the water down and had a minor fall to the floor.  She had to scoot across the floor and get herself up using her chair.  She reports at that time she did not have any major injury.  Her hand was sore but she reports it has been hurting her for quite some time and she sees a specialist for that.  She did not hit her head or lose consciousness.  She took her diuretics around 3:00 when she had gotten home and took some medicine for a headache which she had been having throughout the day.  She then took a nap.  She reports when she woke up that maybe around 7 or 8:00 she started having racing of her heart and it was beating very fast causing some mild chest pain and some shortness of breath.  She reports she has never had symptoms like that before and she got scared and called her aunt who instructed her to hang up the phone and call 911.  EMS reported upon their arrival patient was in SVT but it converted prior to arrival to the emergency room.  Patient reports while she has been waiting out in the lobby she starting to feel very anxious she is having increased severity of her chronic pain in multiple joints including her wrists, legs  and back.  She denies any chest pain or abdominal pain at this time.  She has not had any diarrhea vomiting or fever.  She has a chronic cough with sputum production in the mornings for years but no significant change.  She has not noticed any increased swelling in her lower extremities and has been taking her diuretic as prescribed.  She has received her Covid vaccine.  She denies feeling more short of breath than normal and reports that she does wear her 2 L of oxygen all the time.  She has no prior history of SVT that she is aware of and denies any recent surgeries, unilateral leg pain or swelling and does not take anticoagulation.  However on cardiology notes pt has had SVT in the past. She reports her thyroid has not been tested in quite some time greater than 1 year.  The history is provided by the patient, medical records and the EMS personnel.  Chest Pain      Past Medical History:  Diagnosis Date   Anxiety    Arthritis    Asthma    Chronic diastolic CHF (congestive heart failure) (HCC)    COPD (chronic obstructive pulmonary disease) (HCC)    Uses Oxygen at night   Depression    Diabetes mellitus without complication (HCC)    Dyspnea    occ  Family history of thyroid cancer    GERD (gastroesophageal reflux disease)    Gout    Headache    migraines   History of DVT of lower extremity    History of nuclear stress test    Myoview 2/17:  Low risk stress nuclear study with a small, moderate intensity, partially reversible inferior lateral defect consistent with small prior infarct and minimal peri-infarct ischemia; EF 68 with normal wall motion   Hypertension    Pneumonia    Thyroid cancer ([place]) 09/23/2016    Patient Active Problem List   Diagnosis Date Noted   Morbid obesity with BMI of 50.0-59.9, adult ([place])    Fall at home, initial encounter 06/23/2018   HNP (herniated nucleus pulposus), thoracic 06/23/2018   CHF (congestive heart failure) ([place])  06/18/2018   Hypertensive disorder 08/28/2017   Obesity 08/28/2017   COPD exacerbation ([place]) 08/04/2017   Acute and chronic respiratory failure with hypercapnia ([place])    Pulmonary HTN ([place])    Genetic testing 02/27/2017   Family history of thyroid cancer    Hypomagnesemia 10/03/2016   Atypical chest pain    HLD (hyperlipidemia) 09/23/2016   Gout 09/23/2016   DVT (deep vein thrombosis) in pregnancy 09/23/2016   Thyroid cancer ([place]) 09/23/2016   Hypocalcemia 09/23/2016   AKI (acute kidney injury) ([place]) 09/23/2016   Acute pulmonary edema ([place])    Acute hypoxemic respiratory failure ([place])    Ventilator dependent ([place])    Chronic respiratory failure with hypoxia and hypercapnia ([place]) 07/16/2016   CAP (community acquired pneumonia) 01/23/2016   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 01/23/2016   Pulmonary hypertension ([place]) 01/23/2016   Obesity hypoventilation syndrome ([place]) 08/26/2015   Dyslipidemia associated with type 2 diabetes mellitus ([place]) 04/24/2015   Leukocytosis 04/24/2015   Controlled type 2 diabetes mellitus without complication, without long-term current use of insulin ([place]) 04/24/2015   Anxiety 04/24/2015   Benign essential HTN 04/24/2015   Normocytic anemia 04/24/2015   OSA (obstructive sleep apnea) 05/10/2012   Tobacco abuse 07/04/2011    Past Surgical History:  Procedure Laterality Date   BUNIONECTOMY Bilateral    CARDIAC CATHETERIZATION N/A 06/23/2015   Procedure: Right/Left Heart Cath and Coronary Angiography;  Surgeon: [name], MD;  Location: [facility];  Service: Cardiovascular;  Laterality: N/A;   COLONOSCOPY WITH PROPOFOL N/A 12/15/2015   Procedure: COLONOSCOPY WITH PROPOFOL;  Surgeon: [name], MD;  Location: [facility];  Service: Endoscopy;  Laterality: N/A;   RIGHT HEART CATH N/A 10/01/2019   Procedure: RIGHT HEART CATH;  Surgeon: [name], MD;  Location: [facility];   Service: Cardiovascular;  Laterality: N/A;   THYROIDECTOMY  09/19/2016   THYROIDECTOMY N/A 09/19/2016   Procedure: TOTAL THYROIDECTOMY;  Surgeon: [name], MD;  Location: [facility];  Service: General;  Laterality: N/A;   TONSILLECTOMY     TOTAL ABDOMINAL HYSTERECTOMY  07/14/10     OB History   No obstetric history on file.     Family History  Problem Relation Age of Onset   Cancer Father        thought to be due to exposure to concrete   Diabetes Mother    Thyroid cancer Mother        dx in her 42s-60s   Cancer Maternal Uncle        2 uncles with cancer NOS   Brain cancer Paternal [place]  maternal first cousin - NOS   Cancer Cousin        maternal first cousin - NOS    Social History   Tobacco Use   Smoking status: Former Smoker    Packs/day: 0.50    Years: 41.00    Pack years: 20.50    Types: Cigarettes    Quit date: 07/2016    Years since quitting: 3.6   Smokeless tobacco: Never Used   Tobacco comment: Starting to [employer] -- using Nicotine Patch  Vaping Use   Vaping Use: Never used  Substance Use Topics   Alcohol use: Not Currently    Alcohol/week: 0.0 standard drinks    Comment: ? none now   Drug use: Never    Comment: none for long time    Home Medications Prior to Admission medications   Medication Sig Start Date End Date Taking? Authorizing Provider  albuterol (PROVENTIL HFA;VENTOLIN HFA) 108 (90 Base) MCG/ACT inhaler Inhale 1-2 puffs into the lungs every 6 (six) hours as needed for wheezing or shortness of breath. 04/23/18   Long, [name], MD  allopurinol (ZYLOPRIM) 100 MG tablet Take 100 mg by mouth daily.    [provider]  ANORO ELLIPTA 62.5-25 MCG/INH AEPB Inhale 1 puff into the lungs daily.  04/11/18   [provider]  benzonatate (TESSALON) 200 MG capsule Take 200 mg by mouth 3 (three) times daily. Patient not taking: Reported on 01/14/2020 10/28/19   [provider]  Blood Glucose  Monitoring Suppl (ACCU-CHEK AVIVA PLUS) w/Device KIT 1 each by Other route daily.  08/07/17   [provider]  butalbital-acetaminophen-caffeine (FIORICET, ESGIC) 50-325-40 MG tablet Take 1-2 tablets by mouth every 4 (four) hours as needed for migraine. Patient not taking: Reported on 01/14/2020 06/13/18   [provider]  calcitRIOL (ROCALTROL) 0.5 MCG capsule Take 1 capsule (0.5 mcg total) by mouth daily. 09/26/16   Hongalgi, [name], MD  carvedilol (COREG) 6.25 MG tablet Take 1 tablet (6.25 mg total) by mouth 2 (two) times daily with a meal. 01/23/18   Bensimhon, [name], MD  cetirizine (ZYRTEC) 10 MG tablet Take 10 mg by mouth daily. 07/10/17   [provider]  clonazePAM (KLONOPIN) 2 MG tablet Take 1 tablet (2 mg total) by mouth 3 (three) times daily. for anxiety 06/23/19   [name], MD  cyclobenzaprine (FLEXERIL) 10 MG tablet Take 10 mg by mouth 3 (three) times daily. 11/19/19   [provider]  diclofenac sodium (VOLTAREN) 1 % GEL Apply 2 g topically 4 (four) times daily as needed (for pain). 06/19/18   [name], MD  dicyclomine (BENTYL) 20 MG tablet Take 1 tablet (20 mg total) by mouth every 6 (six) hours as needed for spasms. 07/11/19   Virgel Manifold, MD  EPINEPHrine 0.3 mg/0.3 mL IJ SOAJ injection Inject 0.3 mg into the muscle daily as needed (allergic reaction).    [provider]  FEROSUL 325 (65 Fe) MG tablet Take 325 mg by mouth daily. 03/22/17   [provider]  furosemide (LASIX) 20 MG tablet furosemide 20 mg tablet  TAKE TWO TABLETS BY MOUTH IN THE MORNING AND 1 TABLET IN THE EVENING    [provider]  glucose blood test strip Use as instructed to check blood sugar up to 4 times daily Patient taking differently: 1 each by Other route See admin instructions. Use as instructed to check blood sugar up to 4 times daily 08/06/17   [name], DO  guaiFENesin (MUCINEX) 600 MG 12 hr tablet Take 2 tablets (1,200 mg  total) by mouth 2 (two) times daily. 07/11/18   Florencia Reasons, MD  HYDROcodone-acetaminophen (NORCO/VICODIN) 5-325 MG tablet Take 1 tablet by mouth every 6 (six) hours as needed for moderate pain.  02/12/18   [provider]  hydrOXYzine (ATARAX/VISTARIL) 25 MG tablet Take 1 tablet (25 mg total) by mouth every 6 (six) hours. 07/08/19   Caccavale, Sophia, PA-C  ipratropium-albuterol (DUONEB) 0.5-2.5 (3) MG/3ML SOLN Take 3 mLs by nebulization 4 (four) times daily.  04/05/17   [provider]  levothyroxine (SYNTHROID, LEVOTHROID) 112 MCG tablet Take 224 mcg by mouth daily. 07/11/17   [provider]  lidocaine (LIDODERM) 5 % Place 1 patch onto the skin daily. Remove & Discard patch within 12 hours or as directed by MD 07/11/18   Florencia Reasons, MD  lisinopril (PRINIVIL,ZESTRIL) 5 MG tablet Take 1 tablet (5 mg total) by mouth daily. Please call for office visit [phone] 08/15/17   [name], MD  loperamide (IMODIUM) 2 MG capsule Take 1 capsule (2 mg total) by mouth 4 (four) times daily as needed for diarrhea or loose stools. 6/62/94   [name], MD  metFORMIN (GLUCOPHAGE) 500 MG tablet Take 1 tablet (500 mg total) by mouth 2 (two) times daily with a meal. 12/11/14   [name], MD  methocarbamol (ROBAXIN) 500 MG tablet Take 1 tablet (500 mg total) by mouth every 8 (eight) hours as needed for muscle spasms. 06/19/18   [name], MD  metoCLOPramide (REGLAN) 10 MG tablet Take 1 tablet (10 mg total) by mouth every 6 (six) hours as needed for nausea (or headache). 7/65/46   [name], MD  metoprolol succinate (TOPROL-XL) 25 MG 24 hr tablet Take 25 mg by mouth in the morning.  08/09/18   [provider]  montelukast (SINGULAIR) 10 MG tablet Take 1 tablet (10 mg total) by mouth at bedtime. 07/11/18   Florencia Reasons, MD  nitroGLYCERIN (NITROSTAT) 0.4 MG SL tablet Place 1 tablet (0.4 mg total) under the tongue every 5 (five) minutes as needed for chest pain. 06/19/18   [name], MD  nystatin cream (MYCOSTATIN) Apply 1 application topically 2 (two) times daily as needed for dry skin.  04/13/17   [provider]  omeprazole (PRILOSEC) 20 MG capsule Take by mouth 2 (two) times daily before a meal.  11/19/19   [provider]  oxyCODONE-acetaminophen (PERCOCET) 10-325 MG tablet Take 1 tablet by mouth every 6 (six) hours. 12/03/19   [provider]  OXYGEN Inhale 2 L into the lungs at bedtime.    [provider]  potassium chloride SA (K-DUR,KLOR-CON) 20 MEQ tablet Take 1 tablet (20 mEq total) by mouth 2 (two) times daily. 07/24/18   [name], MD  pravastatin (PRAVACHOL) 40 MG tablet Take 40 mg by mouth every evening.  03/29/15   [provider]  predniSONE (DELTASONE) 20 MG tablet 3 tabs po day one, then 2 po daily x 4 days 06/26/19   Mesner, Corene Cornea, MD  QC STOOL SOFTENER PLS LAXATIVE 8.6-50 MG tablet Take 1 tablet by mouth at bedtime. 04/11/17   [provider]  rosuvastatin (CRESTOR) 10 MG tablet SMARTSIG:1 Tablet(s) By Mouth Every Evening 11/19/19   [provider]  tiotropium (SPIRIVA) 18 MCG inhalation capsule Place 18 mcg into inhaler and inhale daily.    [provider]  torsemide (DEMADEX) 20 MG tablet TAKE 4 (FOUR)  TABLETS BY MOUTH DAILY (2AM+ 2NOON) 11/19/19   [name], MD  traZODone (DESYREL) 50 MG tablet Take 100 mg by mouth at bedtime. 11/19/19   [provider]    Allergies    Bee venom, Fioricet [butalbital-apap-caffeine], Ibuprofen, Lamisil [terbinafine], and Nsaids  Review of Systems   Review of Systems  Cardiovascular: Positive for chest pain.  All other systems reviewed and are negative.   Physical Exam Updated Vital Signs BP 123/68    Pulse (!) 104    Temp 98.2 F (36.8 C) (Oral)    Resp 20    Ht _0  (1.651 m)    Wt 136.1 kg    SpO2 100%    BMI 49.92 kg/m   Physical Exam Vitals and nursing note reviewed.  Constitutional:      General: She is not  in acute distress.    Appearance: She is well-developed. She is obese.  HENT:     Head: Normocephalic and atraumatic.     Nose: Nose normal.     Mouth/Throat:     Mouth: Mucous membranes are moist.  Eyes:     Pupils: Pupils are equal, round, and reactive to light.  Cardiovascular:     Rate and Rhythm: Regular rhythm. Tachycardia present.     Pulses: Normal pulses.     Heart sounds: Normal heart sounds. No murmur heard.  No friction rub.  Pulmonary:     Effort: Pulmonary effort is normal. Tachypnea present.     Breath sounds: Normal breath sounds. No wheezing or rales.  Abdominal:     General: Bowel sounds are normal. There is no distension.     Palpations: Abdomen is soft.     Tenderness: There is no abdominal tenderness. There is no guarding or rebound.  Musculoskeletal:        General: No tenderness. Normal range of motion.     Cervical back: Normal range of motion and neck supple. No tenderness.     Right lower leg: No edema.     Left lower leg: No edema.     Comments: No edema  Skin:    General: Skin is warm and dry.     Capillary Refill: Capillary refill takes less than 2 seconds.     Findings: No rash.  Neurological:     General: No focal deficit present.     Mental Status: She is alert and oriented to person, place, and time. Mental status is at baseline.     Cranial Nerves: No cranial nerve deficit.  Psychiatric:        Mood and Affect: Mood is anxious.        Speech: Speech normal.        Behavior: Behavior normal. Behavior is cooperative.        Thought Content: Thought content normal.      ED Results / Procedures / Treatments   Labs (all labs ordered are listed, but only abnormal results are displayed) Labs Reviewed  BASIC METABOLIC PANEL - Abnormal; Notable for the following components:      Result Value   Chloride 92 (*)    CO2 34 (*)    Glucose, Bld 115 (*)    All other components within normal limits  CBC - Abnormal; Notable for the following  components:   WBC 16.0 (*)    Hemoglobin 11.9 (*)    All other components within normal limits  TROPONIN I (HIGH SENSITIVITY) - Abnormal; Notable for the following components:  Troponin I (High Sensitivity) 18 (*)    All other components within normal limits  TSH  BRAIN NATRIURETIC PEPTIDE  TROPONIN I (HIGH SENSITIVITY)  TROPONIN I (HIGH SENSITIVITY)  TROPONIN I (HIGH SENSITIVITY)    EKG EKG Interpretation  Date/Time:  Saturday February 14 2020 22:41:47 EST Ventricular Rate:  113 PR Interval:  164 QRS Duration: 74 QT Interval:  330 QTC Calculation: 452 R Axis:   54 Text Interpretation: new Sinus tachycardia Otherwise normal ECG Confirmed by Blanchie Dessert [id number]) on 02/15/2020 7:43:26 AM   Radiology DG Chest 2 View  Result Date: 02/15/2020 CLINICAL DATA:  Chest pain EXAM: CHEST - 2 VIEW COMPARISON:  July 05, 2019 FINDINGS: There are linear airspace opacities bilaterally favored to represent areas of scarring or atelectasis. The heart size is stable. There is no pneumothorax. There may be trace to small bilateral pleural effusions. IMPRESSION: Linear airspace opacities bilaterally favored to represent areas of scarring or atelectasis. Electronically Signed   By: Constance Holster M.D.   On: 02/15/2020 00:26    Procedures Procedures (including critical care time)  Medications Ordered in ED Medications  carvedilol (COREG) tablet 6.25 mg (has no administration in time range)  clonazePAM (KLONOPIN) tablet 2 mg (has no administration in time range)  furosemide (LASIX) tablet 40 mg (has no administration in time range)  levothyroxine (SYNTHROID) tablet 224 mcg (has no administration in time range)  oxyCODONE-acetaminophen (PERCOCET/ROXICET) 5-325 MG per tablet 1 tablet (has no administration in time range)    And  oxyCODONE (Oxy IR/ROXICODONE) immediate release tablet 5 mg (has no administration in time range)    ED Course  I have reviewed the triage vital signs and the  nursing notes.  Pertinent labs & imaging results that were available during my care of the patient were reviewed by me and considered in my medical decision making (see chart for details).    MDM Rules/Calculators/A&P                          59 year old female with multiple medical problems presenting today initially with EMS after palpitations.  Patient was found to be in SVT out of hospital but converted prior to arrival.  Patient required no medications for this.  She currently has no specific complaints except for feeling anxious and having pain and wanting her home medications.  Patient has no infectious symptoms at that this time has no significant findings of fluid overload but is slightly tachypneic on exam.  She is not wheezing and low suspicion for COPD exacerbation.  Patient EKG here showed sinus tachycardia but no evidence of dysrhythmia.  BMP with mild decreased chloride of 92 and normal anion gap of 13.  CBC with leukocytosis of 16,000 of unknown significance and stable hemoglobin.  Initial troponin was 18 repeat was 13 and chest x-ray showing linear airspace opacities bilaterally favoring scarring or atelectasis.  Low suspicion for pneumonia at this time as patient denies any new respiratory symptoms.  Initial troponin of 18 may be from slight leak if patient was in SVT and improved second troponin.  Will check BNP, TSH and a third troponin to ensure downtrending.  Patient given a dose of her home medications.  10:18 AM Remainder of labs wnl.  Pt's VS are stable and mild tachycardia of 100-104 but looking at recent office notes that appears to be baseline.  Discussed findings with pt and give print out of labs.  Recommended cardiology follow up and discussed  findings with her mom over the phone as well.  Feel pt is stable for d/c at this time.  MDM Number of Diagnoses or Management Options   Amount and/or Complexity of Data Reviewed Clinical lab tests: ordered and reviewed Tests in  the radiology section of CPT: ordered and reviewed Tests in the medicine section of CPT: ordered and reviewed Decide to obtain previous medical records or to obtain history from someone other than the patient: yes Obtain history from someone other than the patient: yes Review and summarize past medical records: yes Discuss the patient with other providers: no Independent visualization of images, tracings, or specimens: yes  Risk of Complications, Morbidity, and/or Mortality Presenting problems: moderate Diagnostic procedures: low Management options: low  Patient Progress Patient progress: improved  Final Clinical Impression(s) / ED Diagnoses Final diagnoses:  SVT (supraventricular tachycardia) ([place])    Rx / DC Orders ED Discharge Orders    None       Blanchie Dessert, MD 02/15/20 1022

## 2020-02-15 NOTE — Care Management (Signed)
Michele Owens ride set up for patient per request.Already had previous rides, patient was in the system. Verified address and phone number.

## 2020-02-18 DIAGNOSIS — M25532 Pain in left wrist: Secondary | ICD-10-CM | POA: Diagnosis not present

## 2020-03-09 DIAGNOSIS — M25561 Pain in right knee: Secondary | ICD-10-CM | POA: Diagnosis not present

## 2020-03-09 DIAGNOSIS — M1712 Unilateral primary osteoarthritis, left knee: Secondary | ICD-10-CM | POA: Diagnosis not present

## 2020-03-10 DIAGNOSIS — Z419 Encounter for procedure for purposes other than remedying health state, unspecified: Secondary | ICD-10-CM | POA: Diagnosis not present

## 2020-03-11 ENCOUNTER — Ambulatory Visit: Payer: Medicaid Other | Admitting: Podiatry

## 2020-03-11 ENCOUNTER — Other Ambulatory Visit: Payer: Self-pay

## 2020-03-11 DIAGNOSIS — M722 Plantar fascial fibromatosis: Secondary | ICD-10-CM

## 2020-03-11 DIAGNOSIS — M2142 Flat foot [pes planus] (acquired), left foot: Secondary | ICD-10-CM | POA: Diagnosis not present

## 2020-03-11 DIAGNOSIS — M79675 Pain in left toe(s): Secondary | ICD-10-CM | POA: Diagnosis not present

## 2020-03-11 DIAGNOSIS — M2141 Flat foot [pes planus] (acquired), right foot: Secondary | ICD-10-CM

## 2020-03-11 DIAGNOSIS — M79674 Pain in right toe(s): Secondary | ICD-10-CM | POA: Diagnosis not present

## 2020-03-11 DIAGNOSIS — M79671 Pain in right foot: Secondary | ICD-10-CM | POA: Diagnosis not present

## 2020-03-11 DIAGNOSIS — B351 Tinea unguium: Secondary | ICD-10-CM | POA: Diagnosis not present

## 2020-03-11 DIAGNOSIS — E1142 Type 2 diabetes mellitus with diabetic polyneuropathy: Secondary | ICD-10-CM | POA: Diagnosis not present

## 2020-03-11 DIAGNOSIS — M79672 Pain in left foot: Secondary | ICD-10-CM

## 2020-03-11 NOTE — Patient Instructions (Signed)
Look for Voltaren gel at the pharmacy over the counter or online (also known as diclofenac 1% gel). Apply to the painful areas 3-4x daily with the supplied dosing card. Allow to dry for 10 minutes before going into socks/shoes  

## 2020-03-14 DIAGNOSIS — M2141 Flat foot [pes planus] (acquired), right foot: Secondary | ICD-10-CM | POA: Diagnosis not present

## 2020-03-14 DIAGNOSIS — M722 Plantar fascial fibromatosis: Secondary | ICD-10-CM

## 2020-03-14 DIAGNOSIS — E1142 Type 2 diabetes mellitus with diabetic polyneuropathy: Secondary | ICD-10-CM | POA: Diagnosis not present

## 2020-03-14 DIAGNOSIS — B351 Tinea unguium: Secondary | ICD-10-CM | POA: Diagnosis not present

## 2020-03-14 DIAGNOSIS — M79675 Pain in left toe(s): Secondary | ICD-10-CM | POA: Diagnosis not present

## 2020-03-14 DIAGNOSIS — M79674 Pain in right toe(s): Secondary | ICD-10-CM | POA: Diagnosis not present

## 2020-03-14 DIAGNOSIS — M2142 Flat foot [pes planus] (acquired), left foot: Secondary | ICD-10-CM | POA: Diagnosis not present

## 2020-03-14 DIAGNOSIS — M79671 Pain in right foot: Secondary | ICD-10-CM | POA: Diagnosis not present

## 2020-03-14 MED ORDER — TRIAMCINOLONE ACETONIDE 10 MG/ML IJ SUSP
5.0000 mg | Freq: Once | INTRAMUSCULAR | Status: AC
  Administered 2020-03-14: 5 mg

## 2020-03-14 MED ORDER — DEXAMETHASONE SODIUM PHOSPHATE 4 MG/ML IJ SOLN
2.0000 mg | Freq: Once | INTRAMUSCULAR | Status: AC
  Administered 2020-03-14: 2 mg

## 2020-03-14 NOTE — Progress Notes (Signed)
  Subjective:  Patient ID: Michele Owens, female    DOB: 1960/05/11,  MRN: [id number]  Chief Complaint  Patient presents with  . Foot Pain    Pt stated that she is here for Bilateral injections and she would like her nails trimmed    59 y.o. female returns with the above complaint. History confirmed with patient.  Injections have been helpful. She requests another injection today.  Objective:  Physical Exam: warm, good capillary refill, no trophic changes or ulcerative lesions and normal DP and PT pulses, onychomycosis x10 toenails and bilateral heel pain with palpation of the insertion of plantar fascial medial calcaneal tubercle. Assessment:   No diagnosis found.   Plan:  Patient was evaluated and treated and all questions answered.   Patient educated on diabetes. Discussed proper diabetic foot care and discussed risks and complications of disease. Educated patient in depth on reasons to return to the office immediately should he/she discover anything concerning or new on the feet. All questions answered. Discussed proper shoes as well.   Discussed the etiology and treatment options for the condition in detail with the patient. Educated patient on the topical and oral treatment options for mycotic nails. Recommended debridement of the nails today. Sharp and mechanical debridement performed of all painful and mycotic nails today. Nails debrided in length and thickness using a nail nipper and a mechanical burr to level of comfort. Discussed treatment options including appropriate shoe gear. Follow up as needed for painful nails.  After sterile prep with povidone-iodine solution and alcohol, the left medial heel was injected with 0.5cc 2% xylocaine plain, 0.5cc 0.5% marcaine plain, 5mg  triamcinolone acetonide, and 2mg  dexamethasone was injected along the plantar fascia at the insertion on the plantar calcaneus.  I again discussed with her the risks and benefits of this and that this could  cause transient hyperglycemia and that she should closely watch her blood sugar and diet over the next few days. This time she reacted very poorly to the injection screaming rather loudly during the procedure. I recommended we not inject the right side. Reaction is almost out of proportion to the amount of pain I would expect for this. Unclear if this is psychological or truly reaction from chronic pain sensitivity. It was fairly disruptive for her other patients and think we should with this in the future we can. I recommend we start physical therapy for the plantar fasciitis.  Return in about 6 months (around 09/09/2020).

## 2020-03-15 DIAGNOSIS — I110 Hypertensive heart disease with heart failure: Secondary | ICD-10-CM | POA: Diagnosis not present

## 2020-03-16 DIAGNOSIS — I110 Hypertensive heart disease with heart failure: Secondary | ICD-10-CM | POA: Diagnosis not present

## 2020-03-17 ENCOUNTER — Telehealth: Payer: Self-pay | Admitting: Podiatry

## 2020-03-17 DIAGNOSIS — I110 Hypertensive heart disease with heart failure: Secondary | ICD-10-CM | POA: Diagnosis not present

## 2020-03-17 NOTE — Telephone Encounter (Signed)
Patient called and wanted to know when she would receive a call for Physical Therapy. She has not heard anything and she wanted to follow up

## 2020-03-17 NOTE — Addendum Note (Signed)
Addended bySherryle Lis, Evonda Enge R on: 03/17/2020 05:22 PM   Modules accepted: Orders

## 2020-03-17 NOTE — Telephone Encounter (Signed)
She should be able to call and make an appointment now if they haven't reached out to her:  [facility]  [address] [place], [place] [address]

## 2020-03-18 DIAGNOSIS — I110 Hypertensive heart disease with heart failure: Secondary | ICD-10-CM | POA: Diagnosis not present

## 2020-03-18 NOTE — Telephone Encounter (Signed)
Spoke to patient and notified her.

## 2020-03-18 NOTE — Telephone Encounter (Signed)
Thank you :)

## 2020-03-19 DIAGNOSIS — I110 Hypertensive heart disease with heart failure: Secondary | ICD-10-CM | POA: Diagnosis not present

## 2020-03-21 DIAGNOSIS — I110 Hypertensive heart disease with heart failure: Secondary | ICD-10-CM | POA: Diagnosis not present

## 2020-03-22 DIAGNOSIS — I110 Hypertensive heart disease with heart failure: Secondary | ICD-10-CM | POA: Diagnosis not present

## 2020-03-23 ENCOUNTER — Telehealth: Payer: Self-pay | Admitting: Podiatry

## 2020-03-23 DIAGNOSIS — I110 Hypertensive heart disease with heart failure: Secondary | ICD-10-CM | POA: Diagnosis not present

## 2020-03-23 NOTE — Telephone Encounter (Signed)
Patient wants to know if she can be referred to the [facility] at the [facility], Please Advise

## 2020-03-24 ENCOUNTER — Ambulatory Visit: Payer: Medicaid Other

## 2020-03-24 DIAGNOSIS — I110 Hypertensive heart disease with heart failure: Secondary | ICD-10-CM | POA: Diagnosis not present

## 2020-03-24 NOTE — Telephone Encounter (Signed)
From what I can tell this is just an aquatic center that she can sign up for classes and should not need a referral, she can call or go there to joint

## 2020-03-25 DIAGNOSIS — I110 Hypertensive heart disease with heart failure: Secondary | ICD-10-CM | POA: Diagnosis not present

## 2020-03-26 DIAGNOSIS — I110 Hypertensive heart disease with heart failure: Secondary | ICD-10-CM | POA: Diagnosis not present

## 2020-03-28 DIAGNOSIS — I110 Hypertensive heart disease with heart failure: Secondary | ICD-10-CM | POA: Diagnosis not present

## 2020-03-29 DIAGNOSIS — I110 Hypertensive heart disease with heart failure: Secondary | ICD-10-CM | POA: Diagnosis not present

## 2020-03-30 DIAGNOSIS — I110 Hypertensive heart disease with heart failure: Secondary | ICD-10-CM | POA: Diagnosis not present

## 2020-03-31 DIAGNOSIS — I110 Hypertensive heart disease with heart failure: Secondary | ICD-10-CM | POA: Diagnosis not present

## 2020-04-01 DIAGNOSIS — I110 Hypertensive heart disease with heart failure: Secondary | ICD-10-CM | POA: Diagnosis not present

## 2020-04-02 DIAGNOSIS — I110 Hypertensive heart disease with heart failure: Secondary | ICD-10-CM | POA: Diagnosis not present

## 2020-04-04 DIAGNOSIS — I110 Hypertensive heart disease with heart failure: Secondary | ICD-10-CM | POA: Diagnosis not present

## 2020-04-05 DIAGNOSIS — I110 Hypertensive heart disease with heart failure: Secondary | ICD-10-CM | POA: Diagnosis not present

## 2020-04-06 DIAGNOSIS — I110 Hypertensive heart disease with heart failure: Secondary | ICD-10-CM | POA: Diagnosis not present

## 2020-04-08 ENCOUNTER — Ambulatory Visit: Payer: Medicaid Other

## 2020-04-09 IMAGING — DX DG CHEST 2V
3 series · 3 of 3 positions shown · non-contrast
Comparison: Prior radiograph from 04/17/2017.

CLINICAL DATA: Initial evaluation for acute chest pain.

EXAM:
CHEST - 2 VIEW

[chest lat (1 of 2)]
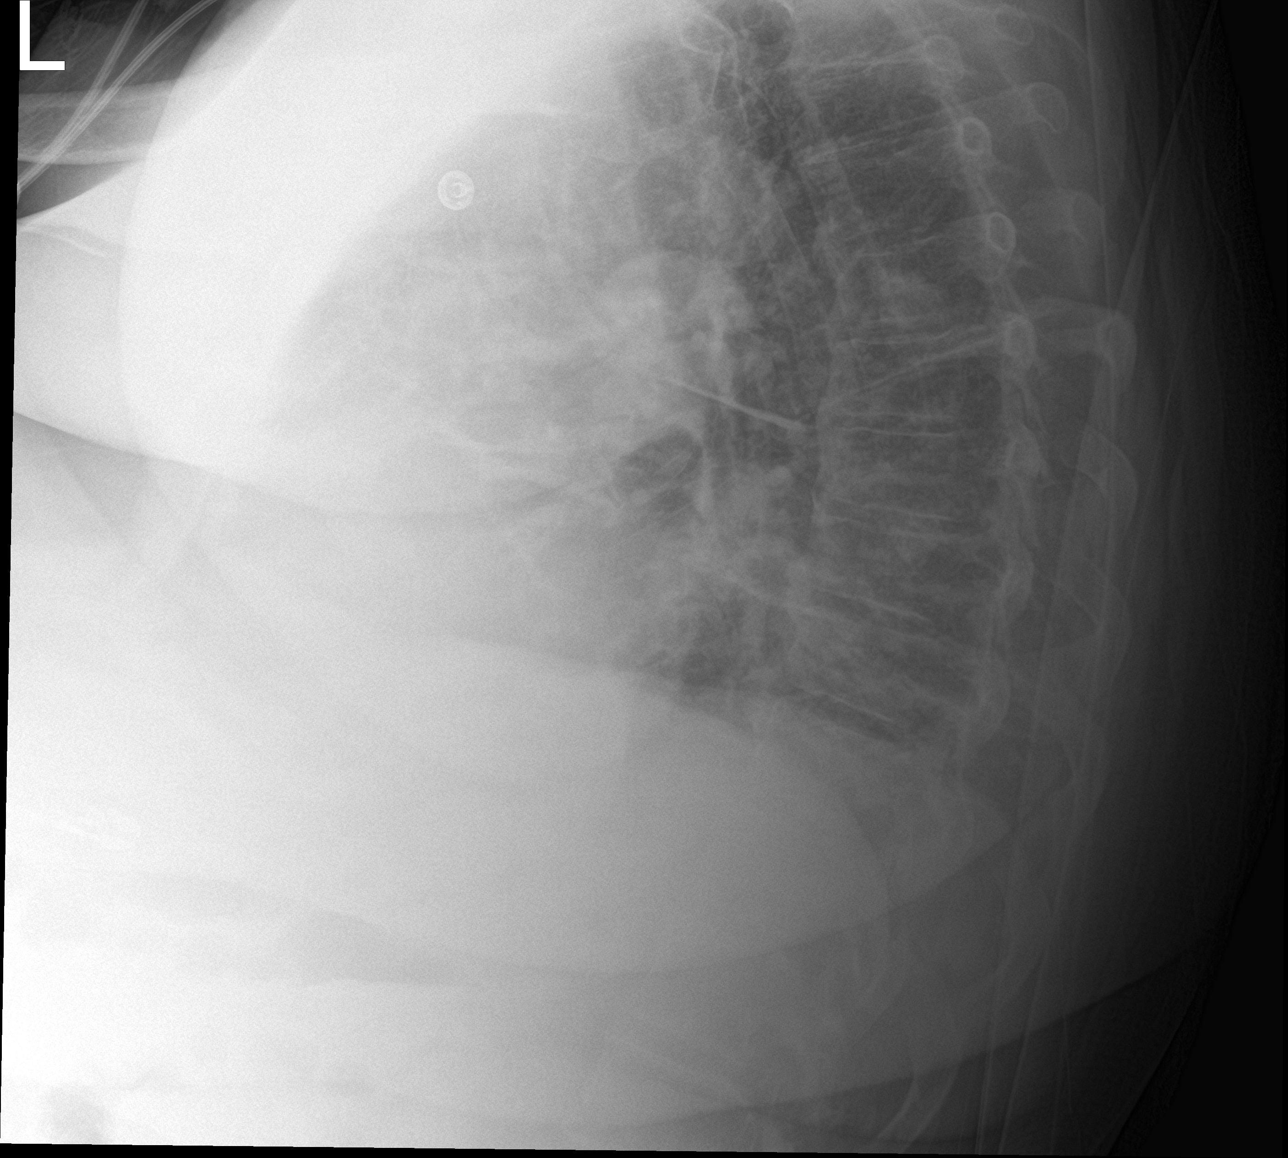

[chest ap]
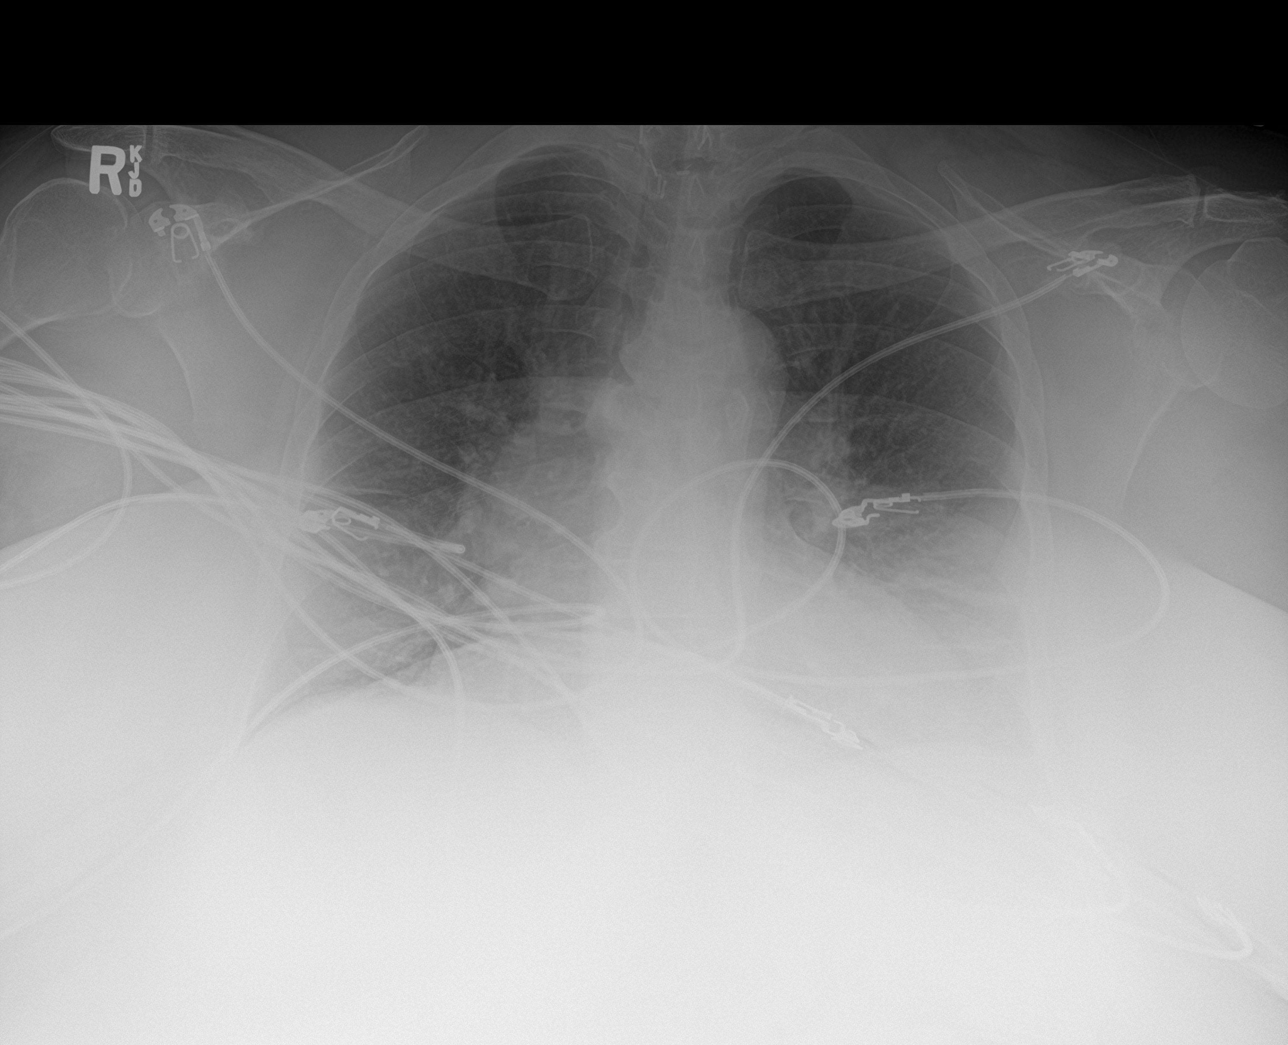

[chest lat (2 of 2)]
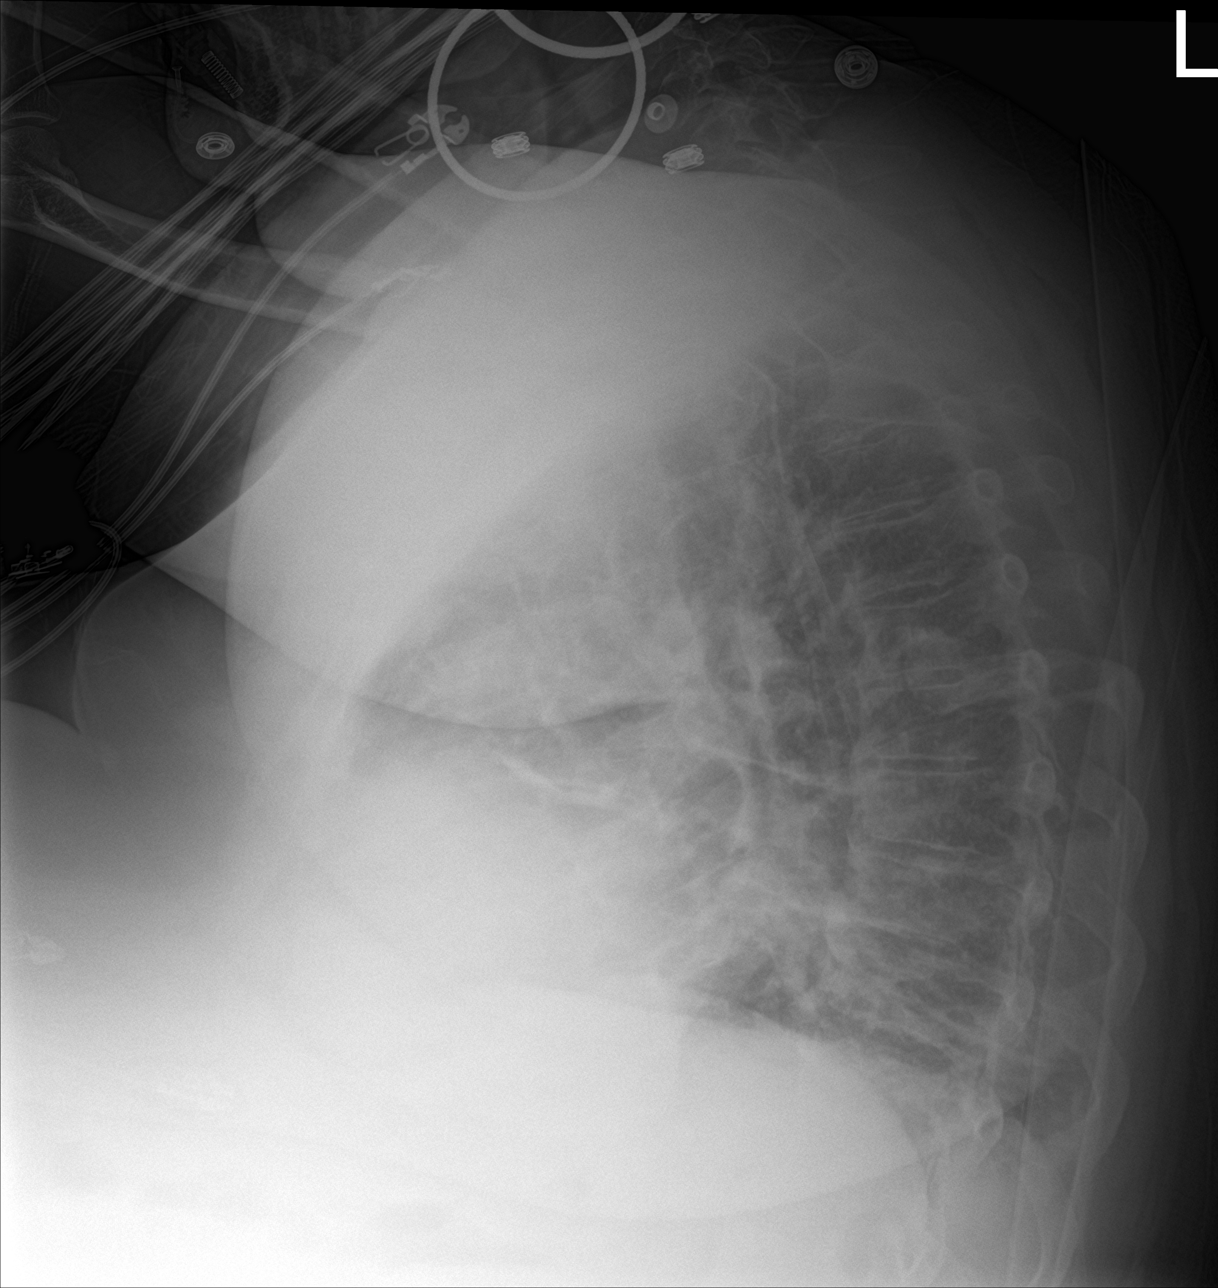

[3 of 3 positions shown; findings below may reference images not displayed]

FINDINGS: Cardiomegaly, stable.  Mediastinal silhouette within normal limits.

Lungs mildly hypoinflated. Mild diffuse vascular and interstitial
prominence, suggesting mild pulmonary interstitial congestion. No
frank pulmonary edema. Superimposed left basilar atelectasis. No
focal infiltrates. No pneumothorax.

No acute osseus abnormality. Surgical clips overlie the lower mid
neck.
IMPRESSION: 1. Cardiomegaly with mild diffuse pulmonary interstitial congestion.
2. Superimposed left basilar atelectasis. No consolidative airspace
disease.

## 2020-04-10 ENCOUNTER — Emergency Department (HOSPITAL_COMMUNITY): Payer: Medicaid Other

## 2020-04-10 ENCOUNTER — Inpatient Hospital Stay (HOSPITAL_COMMUNITY)
Admission: EM | Admit: 2020-04-10 | Discharge: 2020-04-13 | DRG: 194 | Disposition: A | Discharge status: Home / Self Care | Payer: Medicaid Other | Attending: Internal Medicine | Admitting: Internal Medicine

## 2020-04-10 ENCOUNTER — Other Ambulatory Visit: Payer: Self-pay

## 2020-04-10 ENCOUNTER — Encounter (HOSPITAL_COMMUNITY): Payer: Self-pay | Admitting: Emergency Medicine

## 2020-04-10 DIAGNOSIS — R509 Fever, unspecified: Secondary | ICD-10-CM | POA: Diagnosis not present

## 2020-04-10 DIAGNOSIS — I110 Hypertensive heart disease with heart failure: Secondary | ICD-10-CM | POA: Diagnosis present

## 2020-04-10 DIAGNOSIS — J189 Pneumonia, unspecified organism: Secondary | ICD-10-CM | POA: Diagnosis present

## 2020-04-10 DIAGNOSIS — E890 Postprocedural hypothyroidism: Secondary | ICD-10-CM | POA: Diagnosis present

## 2020-04-10 DIAGNOSIS — I959 Hypotension, unspecified: Secondary | ICD-10-CM | POA: Diagnosis not present

## 2020-04-10 DIAGNOSIS — Z87891 Personal history of nicotine dependence: Secondary | ICD-10-CM

## 2020-04-10 DIAGNOSIS — R0902 Hypoxemia: Secondary | ICD-10-CM | POA: Diagnosis present

## 2020-04-10 DIAGNOSIS — G8929 Other chronic pain: Secondary | ICD-10-CM | POA: Diagnosis present

## 2020-04-10 DIAGNOSIS — Z79891 Long term (current) use of opiate analgesic: Secondary | ICD-10-CM

## 2020-04-10 DIAGNOSIS — R410 Disorientation, unspecified: Secondary | ICD-10-CM | POA: Diagnosis not present

## 2020-04-10 DIAGNOSIS — R1110 Vomiting, unspecified: Secondary | ICD-10-CM | POA: Diagnosis not present

## 2020-04-10 DIAGNOSIS — E1169 Type 2 diabetes mellitus with other specified complication: Secondary | ICD-10-CM | POA: Diagnosis present

## 2020-04-10 DIAGNOSIS — Z419 Encounter for procedure for purposes other than remedying health state, unspecified: Secondary | ICD-10-CM | POA: Diagnosis not present

## 2020-04-10 DIAGNOSIS — R109 Unspecified abdominal pain: Secondary | ICD-10-CM | POA: Diagnosis not present

## 2020-04-10 DIAGNOSIS — Z886 Allergy status to analgesic agent status: Secondary | ICD-10-CM

## 2020-04-10 DIAGNOSIS — E785 Hyperlipidemia, unspecified: Secondary | ICD-10-CM | POA: Diagnosis present

## 2020-04-10 DIAGNOSIS — Z833 Family history of diabetes mellitus: Secondary | ICD-10-CM

## 2020-04-10 DIAGNOSIS — I10 Essential (primary) hypertension: Secondary | ICD-10-CM | POA: Diagnosis present

## 2020-04-10 DIAGNOSIS — R059 Cough, unspecified: Secondary | ICD-10-CM | POA: Diagnosis not present

## 2020-04-10 DIAGNOSIS — M25562 Pain in left knee: Secondary | ICD-10-CM | POA: Diagnosis not present

## 2020-04-10 DIAGNOSIS — I499 Cardiac arrhythmia, unspecified: Secondary | ICD-10-CM | POA: Diagnosis not present

## 2020-04-10 DIAGNOSIS — E119 Type 2 diabetes mellitus without complications: Secondary | ICD-10-CM | POA: Diagnosis present

## 2020-04-10 DIAGNOSIS — I5032 Chronic diastolic (congestive) heart failure: Secondary | ICD-10-CM | POA: Diagnosis present

## 2020-04-10 DIAGNOSIS — Z743 Need for continuous supervision: Secondary | ICD-10-CM | POA: Diagnosis not present

## 2020-04-10 DIAGNOSIS — Z7984 Long term (current) use of oral hypoglycemic drugs: Secondary | ICD-10-CM

## 2020-04-10 DIAGNOSIS — Z6841 Body Mass Index (BMI) 40.0 and over, adult: Secondary | ICD-10-CM

## 2020-04-10 DIAGNOSIS — Z86718 Personal history of other venous thrombosis and embolism: Secondary | ICD-10-CM

## 2020-04-10 DIAGNOSIS — Z91030 Bee allergy status: Secondary | ICD-10-CM

## 2020-04-10 DIAGNOSIS — Z85850 Personal history of malignant neoplasm of thyroid: Secondary | ICD-10-CM

## 2020-04-10 DIAGNOSIS — B372 Candidiasis of skin and nail: Secondary | ICD-10-CM | POA: Diagnosis present

## 2020-04-10 DIAGNOSIS — M109 Gout, unspecified: Secondary | ICD-10-CM | POA: Diagnosis present

## 2020-04-10 DIAGNOSIS — Z20822 Contact with and (suspected) exposure to covid-19: Secondary | ICD-10-CM | POA: Diagnosis present

## 2020-04-10 DIAGNOSIS — D72829 Elevated white blood cell count, unspecified: Secondary | ICD-10-CM | POA: Diagnosis not present

## 2020-04-10 DIAGNOSIS — L739 Follicular disorder, unspecified: Secondary | ICD-10-CM | POA: Diagnosis present

## 2020-04-10 DIAGNOSIS — Z79899 Other long term (current) drug therapy: Secondary | ICD-10-CM

## 2020-04-10 DIAGNOSIS — Z79890 Hormone replacement therapy (postmenopausal): Secondary | ICD-10-CM

## 2020-04-10 DIAGNOSIS — J440 Chronic obstructive pulmonary disease with acute lower respiratory infection: Secondary | ICD-10-CM | POA: Diagnosis present

## 2020-04-10 DIAGNOSIS — K760 Fatty (change of) liver, not elsewhere classified: Secondary | ICD-10-CM | POA: Diagnosis not present

## 2020-04-10 LAB — CBC WITH DIFFERENTIAL/PLATELET
Abs Immature Granulocytes: 0.35 10*3/uL — ABNORMAL HIGH (ref 0.00–0.07)
Basophils Absolute: 0.1 10*3/uL (ref 0.0–0.1)
Basophils Relative: 0 %
Eosinophils Absolute: 0.2 10*3/uL (ref 0.0–0.5)
Eosinophils Relative: 1 %
HCT: 37.8 % (ref 36.0–46.0)
Hemoglobin: 11.3 g/dL — ABNORMAL LOW (ref 12.0–15.0)
Immature Granulocytes: 1 %
Lymphocytes Relative: 10 %
Lymphs Abs: 3.3 10*3/uL (ref 0.7–4.0)
MCH: 28.5 pg (ref 26.0–34.0)
MCHC: 29.9 g/dL — ABNORMAL LOW (ref 30.0–36.0)
MCV: 95.5 fL (ref 80.0–100.0)
Monocytes Absolute: 1.2 10*3/uL — ABNORMAL HIGH (ref 0.1–1.0)
Monocytes Relative: 4 %
Neutro Abs: 26.9 10*3/uL — ABNORMAL HIGH (ref 1.7–7.7)
Neutrophils Relative %: 84 %
Platelets: 299 10*3/uL (ref 150–400)
RBC: 3.96 MIL/uL (ref 3.87–5.11)
RDW: 13.8 % (ref 11.5–15.5)
WBC: 32.2 10*3/uL — ABNORMAL HIGH (ref 4.0–10.5)
nRBC: 0 % (ref 0.0–0.2)

## 2020-04-10 LAB — I-STAT VENOUS BLOOD GAS, ED
Acid-Base Excess: 9 mmol/L — ABNORMAL HIGH (ref 0.0–2.0)
Bicarbonate: 35.7 mmol/L — ABNORMAL HIGH (ref 20.0–28.0)
Calcium, Ion: 0.97 mmol/L — ABNORMAL LOW (ref 1.15–1.40)
HCT: 36 % (ref 36.0–46.0)
Hemoglobin: 12.2 g/dL (ref 12.0–15.0)
O2 Saturation: 99 %
Potassium: 4.2 mmol/L (ref 3.5–5.1)
Sodium: 139 mmol/L (ref 135–145)
TCO2: 37 mmol/L — ABNORMAL HIGH (ref 22–32)
pCO2, Ven: 60.5 mmHg — ABNORMAL HIGH (ref 44.0–60.0)
pH, Ven: 7.378 (ref 7.250–7.430)
pO2, Ven: 145 mmHg — ABNORMAL HIGH (ref 32.0–45.0)

## 2020-04-10 LAB — COMPREHENSIVE METABOLIC PANEL
ALT: 19 U/L (ref 0–44)
AST: 17 U/L (ref 15–41)
Albumin: 3.7 g/dL (ref 3.5–5.0)
Alkaline Phosphatase: 72 U/L (ref 38–126)
Anion gap: 13 (ref 5–15)
BUN: 15 mg/dL (ref 6–20)
CO2: 31 mmol/L (ref 22–32)
Calcium: 7.9 mg/dL — ABNORMAL LOW (ref 8.9–10.3)
Chloride: 96 mmol/L — ABNORMAL LOW (ref 98–111)
Creatinine, Ser: 0.83 mg/dL (ref 0.44–1.00)
GFR, Estimated: >60 mL/min (ref >60)
Glucose, Bld: 135 mg/dL — ABNORMAL HIGH (ref 70–99)
Potassium: 4.3 mmol/L (ref 3.5–5.1)
Sodium: 140 mmol/L (ref 135–145)
Total Bilirubin: 0.6 mg/dL (ref 0.3–1.2)
Total Protein: 7.5 g/dL (ref 6.5–8.1)

## 2020-04-10 LAB — CBG MONITORING, ED: Glucose-Capillary: 128 mg/dL — ABNORMAL HIGH (ref 70–99)

## 2020-04-10 LAB — RESP PANEL BY RT-PCR (FLU A&B, COVID) ARPGX2
Influenza A by PCR: NEGATIVE
Influenza B by PCR: NEGATIVE
SARS Coronavirus 2 by RT PCR: NEGATIVE

## 2020-04-10 LAB — LIPASE, BLOOD: Lipase: 22 U/L (ref 11–51)

## 2020-04-10 LAB — LACTIC ACID, PLASMA: Lactic Acid, Venous: 1.5 mmol/L (ref 0.5–1.9)

## 2020-04-10 MED ORDER — IOHEXOL 300 MG/ML  SOLN
100.0000 mL | Freq: Once | INTRAMUSCULAR | Status: AC | PRN
  Administered 2020-04-10: 100 mL via INTRAVENOUS

## 2020-04-10 MED ORDER — SODIUM CHLORIDE 0.9 % IV BOLUS
1000.0000 mL | Freq: Once | INTRAVENOUS | Status: AC
  Administered 2020-04-10: 1000 mL via INTRAVENOUS

## 2020-04-10 MED ORDER — OXYCODONE-ACETAMINOPHEN 5-325 MG PO TABS
1.0000 | ORAL_TABLET | Freq: Once | ORAL | Status: DC
  Filled 2020-04-10: qty 1

## 2020-04-10 MED ORDER — OXYCODONE HCL 5 MG PO TABS
5.0000 mg | ORAL_TABLET | Freq: Once | ORAL | Status: DC
  Filled 2020-04-10: qty 1

## 2020-04-10 NOTE — ED Notes (Signed)
Pt brought to H15.   Pt reports her fluid pill isn't working and has extra fluid in her left knee. Pt denies increased SOB, but reports cough x 1 week.  Pt is unable to stay awake during assessment. Pt is diaphoretic and endorses hot/cold flashes. Vital signs stable at this time- afebrile.

## 2020-04-10 NOTE — ED Notes (Signed)
Patient transported to X-ray 

## 2020-04-10 NOTE — ED Provider Notes (Signed)
[facility] EMERGENCY DEPARTMENT Provider Note   CSN: [id number] Arrival date & time: 04/10/20  1433     History Chief Complaint  Patient presents with  . Knee Pain    Michele Owens is a 60 y.o. female presenting for evaluation of pain.   Level V caveat due to somnolence.   Pt states she is here for pain. She then states she has not been feeling well recently, but cannot describe more.  She states she has been awake for 3 days, this is why she is so tired. She has diffuse pain, states this is chronic. Denies taking more than prescribed meds. She is on O2 at baseline for COPD, has not had increased SOB. She has a chronic cough, no fever. No urinary sxs.   On repeat exam, pt is more awake.  She states she has not been feeling well recently, having abdominal pain and nausea.  No urinary symptoms.  She reports boils, one under her right breast.  She had one in between her thighs, but this has resolved.  Additional history taken chart review.  Patient with a history of anxiety, asthma, CHF, COPD, depression, diabetes, GERD, history of DVT, hypertension, pneumonia, thyroid issues    HPI     Past Medical History:  Diagnosis Date  . Anxiety   . Arthritis   . Asthma   . Chronic diastolic CHF (congestive heart failure) ([place])   . COPD (chronic obstructive pulmonary disease) (HCC)    Uses Oxygen at night  . Depression   . Diabetes mellitus without complication ([place])   . Dyspnea    occ  . Family history of thyroid cancer   . GERD (gastroesophageal reflux disease)   . Gout   . Headache    migraines  . History of DVT of lower extremity   . History of nuclear stress test    Myoview 2/17:  Low risk stress nuclear study with a small, moderate intensity, partially reversible inferior lateral defect consistent with small prior infarct and minimal peri-infarct ischemia; EF 68 with normal wall motion  . Hypertension   . Pneumonia   . Thyroid cancer ([place]) 09/23/2016     Patient Active Problem List   Diagnosis Date Noted  . Morbid obesity with BMI of 50.0-59.9, adult ([place])   . Fall at home, initial encounter 06/23/2018  . HNP (herniated nucleus pulposus), thoracic 06/23/2018  . CHF (congestive heart failure) ([place]) 06/18/2018  . Hypertensive disorder 08/28/2017  . Obesity 08/28/2017  . COPD exacerbation ([place]) 08/04/2017  . Acute and chronic respiratory failure with hypercapnia ([place])   . Pulmonary HTN ([place])   . Genetic testing 02/27/2017  . Family history of thyroid cancer   . Hypomagnesemia 10/03/2016  . Atypical chest pain   . HLD (hyperlipidemia) 09/23/2016  . Gout 09/23/2016  . DVT (deep vein thrombosis) in pregnancy 09/23/2016  . Thyroid cancer ([place]) 09/23/2016  . Hypocalcemia 09/23/2016  . AKI (acute kidney injury) ([place]) 09/23/2016  . Acute pulmonary edema (HCC)   . Acute hypoxemic respiratory failure ([place])   . Ventilator dependent ([place])   . Chronic respiratory failure with hypoxia and hypercapnia ([place]) 07/16/2016  . CAP (community acquired pneumonia) 01/23/2016  . 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 Pulmonary hypertension ([place]) 01/23/2016  . Obesity hypoventilation syndrome ([place]) 08/26/2015  . Dyslipidemia associated with type 2 diabetes mellitus ([place]) 04/24/2015  . Leukocytosis 04/24/2015  . Controlled type 2 diabetes mellitus without complication, without long-term current use  of insulin ([place]) 04/24/2015  . Anxiety 04/24/2015  . Benign essential HTN 04/24/2015  . Normocytic anemia 04/24/2015  . OSA (obstructive sleep apnea) 05/10/2012  . Tobacco abuse 07/04/2011    Past Surgical History:  Procedure Laterality Date  . BUNIONECTOMY Bilateral   . CARDIAC CATHETERIZATION N/A 06/23/2015   Procedure: Right/Left Heart Cath and Coronary Angiography;  Surgeon: [name], MD;  Location: [facility];  Service: Cardiovascular;  Laterality: N/A;  . COLONOSCOPY WITH PROPOFOL N/A 12/15/2015    Procedure: COLONOSCOPY WITH PROPOFOL;  Surgeon: [name], MD;  Location: [facility];  Service: Endoscopy;  Laterality: N/A;  . RIGHT HEART CATH N/A 10/01/2019   Procedure: RIGHT HEART CATH;  Surgeon: [name], MD;  Location: [facility];  Service: Cardiovascular;  Laterality: N/A;  . THYROIDECTOMY  09/19/2016  . THYROIDECTOMY N/A 09/19/2016   Procedure: TOTAL THYROIDECTOMY;  Surgeon: [name], MD;  Location: [facility];  Service: General;  Laterality: N/A;  . TONSILLECTOMY    . TOTAL ABDOMINAL HYSTERECTOMY  07/14/10     OB History   No obstetric history on file.     Family History  Problem Relation Age of Onset  . Cancer Father        thought to be due to exposure to concrete  . Diabetes Mother   . Thyroid cancer Mother        dx in her 44s-60s  . Cancer Maternal Uncle        2 uncles with cancer NOS  . Brain cancer Paternal Aunt   . Cancer Cousin        maternal first cousin - NOS  . Cancer Cousin        maternal first cousin - NOS    Social History   Tobacco Use  . Smoking status: Former Smoker    Packs/day: 0.50    Years: 41.00    Pack years: 20.50    Types: Cigarettes    Quit date: 07/2016    Years since quitting: 3.7  . Smokeless tobacco: Never Used  . Tobacco comment: Starting to Wean Off -- using Nicotine Patch  Vaping Use  . Vaping Use: Never used  Substance Use Topics  . Alcohol use: Not Currently    Alcohol/week: 0.0 standard drinks    Comment: ? none now  . Drug use: Never    Comment: none for long time    Home Medications Prior to Admission medications   Medication Sig Start Date End Date Taking? Authorizing Provider  albuterol (PROVENTIL HFA;VENTOLIN HFA) 108 (90 Base) MCG/ACT inhaler Inhale 1-2 puffs into the lungs every 6 (six) hours as needed for wheezing or shortness of breath. 04/23/18   Long, [name], MD  allopurinol (ZYLOPRIM) 100 MG tablet Take 100 mg by mouth daily.    [provider]  ANORO ELLIPTA 62.5-25 MCG/INH  AEPB Inhale 1 puff into the lungs daily.  04/11/18   [provider]  benzonatate (TESSALON) 200 MG capsule Take 200 mg by mouth 3 (three) times daily. Patient not taking: Reported on 01/14/2020 10/28/19   [provider]  Blood Glucose Monitoring Suppl (ACCU-CHEK AVIVA PLUS) w/Device KIT 1 each by Other route daily.  08/07/17   [provider]  butalbital-acetaminophen-caffeine (FIORICET, ESGIC) 50-325-40 MG tablet Take 1-2 tablets by mouth every 4 (four) hours as needed for migraine. Patient not taking: Reported on 01/14/2020 06/13/18   [provider]  calcitRIOL (ROCALTROL) 0.5 MCG capsule Take 1 capsule (  0.5 mcg total) by mouth daily. 09/26/16   Hongalgi, [name], MD  carvedilol (COREG) 6.25 MG tablet Take 1 tablet (6.25 mg total) by mouth 2 (two) times daily with a meal. 01/23/18   Bensimhon, [name], MD  cetirizine (ZYRTEC) 10 MG tablet Take 10 mg by mouth daily. 07/10/17   [provider]  clonazePAM (KLONOPIN) 2 MG tablet Take 1 tablet (2 mg total) by mouth 3 (three) times daily. for anxiety 06/23/19   [name], MD  cyclobenzaprine (FLEXERIL) 10 MG tablet Take 10 mg by mouth 3 (three) times daily. 11/19/19   [provider]  diclofenac sodium (VOLTAREN) 1 % GEL Apply 2 g topically 4 (four) times daily as needed (for pain). 06/19/18   [name], MD  dicyclomine (BENTYL) 20 MG tablet Take 1 tablet (20 mg total) by mouth every 6 (six) hours as needed for spasms. 07/11/19   Virgel Manifold, MD  EPINEPHrine 0.3 mg/0.3 mL IJ SOAJ injection Inject 0.3 mg into the muscle daily as needed (allergic reaction).    [provider]  FEROSUL 325 (65 Fe) MG tablet Take 325 mg by mouth daily. 03/22/17   [provider]  furosemide (LASIX) 20 MG tablet furosemide 20 mg tablet  TAKE TWO TABLETS BY MOUTH IN THE MORNING AND 1 TABLET IN THE EVENING    [provider]  glucose blood test strip Use as instructed to check blood sugar up  to 4 times daily Patient taking differently: 1 each by Other route See admin instructions. Use as instructed to check blood sugar up to 4 times daily 08/06/17   [name], DO  guaiFENesin (MUCINEX) 600 MG 12 hr tablet Take 2 tablets (1,200 mg total) by mouth 2 (two) times daily. 07/11/18   Florencia Reasons, MD  HYDROcodone-acetaminophen (NORCO/VICODIN) 5-325 MG tablet Take 1 tablet by mouth every 6 (six) hours as needed for moderate pain.  02/12/18   [provider]  hydrOXYzine (ATARAX/VISTARIL) 25 MG tablet Take 1 tablet (25 mg total) by mouth every 6 (six) hours. 07/08/19   Tishia Maestre, PA-C  ipratropium-albuterol (DUONEB) 0.5-2.5 (3) MG/3ML SOLN Take 3 mLs by nebulization 4 (four) times daily.  04/05/17   [provider]  levothyroxine (SYNTHROID, LEVOTHROID) 112 MCG tablet Take 224 mcg by mouth daily. 07/11/17   [provider]  lidocaine (LIDODERM) 5 % Place 1 patch onto the skin daily. Remove & Discard patch within 12 hours or as directed by MD 07/11/18   Florencia Reasons, MD  lisinopril (PRINIVIL,ZESTRIL) 5 MG tablet Take 1 tablet (5 mg total) by mouth daily. Please call for office visit [phone] 08/15/17   [name], MD  loperamide (IMODIUM) 2 MG capsule Take 1 capsule (2 mg total) by mouth 4 (four) times daily as needed for diarrhea or loose stools. 3/81/82   [name], MD  metFORMIN (GLUCOPHAGE) 500 MG tablet Take 1 tablet (500 mg total) by mouth 2 (two) times daily with a meal. 12/11/14   [name], MD  methocarbamol (ROBAXIN) 500 MG tablet Take 1 tablet (500 mg total) by mouth every 8 (eight) hours as needed for muscle spasms. 06/19/18   [name], MD  metoCLOPramide (REGLAN) 10 MG tablet Take 1 tablet (10 mg total) by mouth every 6 (six) hours as needed for nausea (or headache). 9/93/71   [name], MD  metoprolol succinate (TOPROL-XL) 25 MG 24 hr tablet Take 25 mg by mouth in the morning.  08/09/18   [provider]  montelukast  (SINGULAIR) 10 MG tablet Take 1 tablet (10 mg total) by mouth at bedtime. 07/11/18   Florencia Reasons, MD  nitroGLYCERIN (NITROSTAT) 0.4 MG SL tablet Place 1 tablet (0.4 mg total) under the tongue every 5 (five) minutes as needed for chest pain. 06/19/18   [name], MD  nystatin cream (MYCOSTATIN) Apply 1 application topically 2 (two) times daily as needed for dry skin.  04/13/17   [provider]  omeprazole (PRILOSEC) 20 MG capsule Take by mouth 2 (two) times daily before a meal.  11/19/19   [provider]  oxyCODONE-acetaminophen (PERCOCET) 10-325 MG tablet Take 1 tablet by mouth every 6 (six) hours. 12/03/19   [provider]  OXYGEN Inhale 2 L into the lungs at bedtime.    [provider]  potassium chloride SA (K-DUR,KLOR-CON) 20 MEQ tablet Take 1 tablet (20 mEq total) by mouth 2 (two) times daily. 07/24/18   [name], MD  pravastatin (PRAVACHOL) 40 MG tablet Take 40 mg by mouth every evening.  03/29/15   [provider]  predniSONE (DELTASONE) 20 MG tablet 3 tabs po day one, then 2 po daily x 4 days 06/26/19   Mesner, Corene Cornea, MD  QC STOOL SOFTENER PLS LAXATIVE 8.6-50 MG tablet Take 1 tablet by mouth at bedtime. 04/11/17   [provider]  rosuvastatin (CRESTOR) 10 MG tablet SMARTSIG:1 Tablet(s) By Mouth Every Evening 11/19/19   [provider]  tiotropium (SPIRIVA) 18 MCG inhalation capsule Place 18 mcg into inhaler and inhale daily.    [provider]  torsemide (DEMADEX) 20 MG tablet TAKE 4 (FOUR) TABLETS BY MOUTH DAILY (2AM+ 2NOON) 11/19/19   [name], MD  traZODone (DESYREL) 50 MG tablet Take 100 mg by mouth at bedtime. 11/19/19   [provider]    Allergies    Bee venom, Fioricet [butalbital-apap-caffeine], Ibuprofen, Lamisil [terbinafine], and Nsaids  Review of Systems   Review of Systems  Gastrointestinal: Positive for abdominal pain.  Musculoskeletal: Positive for arthralgias and myalgias.   Neurological: Positive for weakness.  All other systems reviewed and are negative.   Physical Exam Updated Vital Signs BP (!) 98/50 (BP Location: Right Arm)   Pulse 94   Temp 99 F (37.2 C) (Rectal)   Resp 15   Ht '5\' 5"'  (1.651 m)   SpO2 97%   BMI 49.92 kg/m   Physical Exam Vitals and nursing note reviewed.  Constitutional:      General: She is not in acute distress.    Appearance: She is well-developed and well-nourished. She is obese.     Comments: Initial exam pt is somnolent   HENT:     Head: Normocephalic and atraumatic.  Eyes:     Extraocular Movements: Extraocular movements intact and EOM normal.     Conjunctiva/sclera: Conjunctivae normal.     Pupils: Pupils are equal, round, and reactive to light.  Cardiovascular:     Rate and Rhythm: Normal rate and regular rhythm.     Pulses: Normal pulses and intact distal pulses.  Pulmonary:     Effort: Pulmonary effort is normal. No respiratory distress.     Breath sounds: Normal breath sounds. No wheezing.     Comments: Clear lung sounds Abdominal:     General: There is no distension.     Palpations: Abdomen is soft. There is no mass.     Tenderness: There is no abdominal tenderness. There is no guarding or rebound.  Musculoskeletal:  General: Normal range of motion.     Cervical back: Normal range of motion and neck supple.     Comments: Strength and sensation intact x4  Skin:    General: Skin is warm and dry.     Capillary Refill: Capillary refill takes less than 2 seconds.  Neurological:     Mental Status: She is oriented to person, place, and time. She is lethargic and confused.     GCS: GCS eye subscore is 4. GCS verbal subscore is 5. GCS motor subscore is 6.     Sensory: Sensation is intact.     Motor: Motor function is intact.     Comments: A&O. Pt very somnolent/lethargic initially. Confused.  On repeat exam, pt is much more alert. No somnolence.   Psychiatric:        Mood and Affect: Mood and affect  normal.     ED Results / Procedures / Treatments   Labs (all labs ordered are listed, but only abnormal results are displayed) Labs Reviewed  CBC WITH DIFFERENTIAL/PLATELET - Abnormal; Notable for the following components:      Result Value   WBC 32.2 (*)    Hemoglobin 11.3 (*)    MCHC 29.9 (*)    Neutro Abs 26.9 (*)    Monocytes Absolute 1.2 (*)    Abs Immature Granulocytes 0.35 (*)    All other components within normal limits  COMPREHENSIVE METABOLIC PANEL - Abnormal; Notable for the following components:   Chloride 96 (*)    Glucose, Bld 135 (*)    Calcium 7.9 (*)    All other components within normal limits  CBG MONITORING, ED - Abnormal; Notable for the following components:   Glucose-Capillary 128 (*)    All other components within normal limits  I-STAT VENOUS BLOOD GAS, ED - Abnormal; Notable for the following components:   pCO2, Ven 60.5 (*)    pO2, Ven 145.0 (*)    Bicarbonate 35.7 (*)    TCO2 37 (*)    Acid-Base Excess 9.0 (*)    Calcium, Ion 0.97 (*)    All other components within normal limits  RESP PANEL BY RT-PCR (FLU A&B, COVID) ARPGX2  LACTIC ACID, PLASMA  LIPASE, BLOOD  URINALYSIS, ROUTINE W REFLEX MICROSCOPIC    EKG None  Radiology DG Chest 1 View  Result Date: 04/10/2020 CLINICAL DATA:  Cough EXAM: CHEST  1 VIEW COMPARISON:  02/14/2020 FINDINGS: Shallow inspiration with infiltration or atelectasis in the lung bases. No pleural effusions. No pneumothorax. Degenerative changes in the spine. IMPRESSION: Shallow inspiration with infiltration or atelectasis in the lung bases. Electronically Signed   By: Lucienne Capers M.D.   On: 04/10/2020 20:29    Procedures Procedures (including critical care time)  Medications Ordered in ED Medications  oxyCODONE-acetaminophen (PERCOCET/ROXICET) 5-325 MG per tablet 1 tablet (0 tablets Oral Hold 04/10/20 2236)  oxyCODONE (Oxy IR/ROXICODONE) immediate release tablet 5 mg (0 mg Oral Hold 04/10/20 2228)  sodium  chloride 0.9 % bolus 1,000 mL (1,000 mLs Intravenous New Bag/Given 04/10/20 2235)    ED Course  I have reviewed the triage vital signs and the nursing notes.  Pertinent labs & imaging results that were available during my care of the patient were reviewed by me and considered in my medical decision making (see chart for details).    MDM Rules/Calculators/A&P                          Pt  presenting for evaluation of pain and feeling "not well." on exam initially, pt is somnolent and confused. Concern for infectious cause vs polypharmacy. Pt is on O2, consider hypoxic cause. Will order labs, UA, cxr, vbg.   VBG overall reassuring.  CBC shows significant leukocytosis of 32.  Otherwise electrolytes stable.  Lactic negative.  Covid negative.  Chest x-ray viewed interpreted by me, shows hazy opacities in the bases.  Per radiology, consider atelectasis versus infiltrate.  Urine pending.  As patient reported abdominal symptoms on repeat exam, consider intra-abdominal cause for ill feeling quite somnolent, leukocytosis.  CT pending.  Patient's BP noted to be in the 90's. Will continue to monitor while awaiting imaging. Fluids given. Low threshold for admission due to hypotension, confusion, significant leukocytosis. Pt lives at home by herself as well.   Patient's blood pressure responded well to fluids, however consider pt's concerning AMS and leukocytosis, will call for obvs admission.   Discussed with Dr. Tonie Griffith from [facility] hospitalist service, pt to be admitted.   Final Clinical Impression(s) / ED Diagnoses Final diagnoses:  Confusion    Rx / DC Orders ED Discharge Orders    None       Franchot Heidelberg, PA-C 04/11/20 [place], [place], MD 04/13/20 [phone]

## 2020-04-10 NOTE — ED Triage Notes (Signed)
Pt to triage via [facility] from home.  Reports L knee pain.  Denies injury.  States she takes a fluid pill and feels like there is extra fluid on her knee.  Pt wears 2 liters O2 via [place] at home and 3 liters at night.

## 2020-04-10 NOTE — ED Notes (Signed)
Patient changed into gown and placed on cardiac monitor.

## 2020-04-11 DIAGNOSIS — B372 Candidiasis of skin and nail: Secondary | ICD-10-CM | POA: Diagnosis not present

## 2020-04-11 DIAGNOSIS — K760 Fatty (change of) liver, not elsewhere classified: Secondary | ICD-10-CM | POA: Diagnosis not present

## 2020-04-11 DIAGNOSIS — G894 Chronic pain syndrome: Secondary | ICD-10-CM | POA: Diagnosis not present

## 2020-04-11 DIAGNOSIS — J449 Chronic obstructive pulmonary disease, unspecified: Secondary | ICD-10-CM | POA: Diagnosis not present

## 2020-04-11 DIAGNOSIS — D72829 Elevated white blood cell count, unspecified: Secondary | ICD-10-CM | POA: Diagnosis not present

## 2020-04-11 DIAGNOSIS — R1110 Vomiting, unspecified: Secondary | ICD-10-CM | POA: Diagnosis not present

## 2020-04-11 DIAGNOSIS — L739 Follicular disorder, unspecified: Secondary | ICD-10-CM | POA: Diagnosis not present

## 2020-04-11 DIAGNOSIS — Z79890 Hormone replacement therapy (postmenopausal): Secondary | ICD-10-CM | POA: Diagnosis not present

## 2020-04-11 DIAGNOSIS — G8929 Other chronic pain: Secondary | ICD-10-CM | POA: Diagnosis not present

## 2020-04-11 DIAGNOSIS — Z91030 Bee allergy status: Secondary | ICD-10-CM | POA: Diagnosis not present

## 2020-04-11 DIAGNOSIS — Z85850 Personal history of malignant neoplasm of thyroid: Secondary | ICD-10-CM | POA: Diagnosis not present

## 2020-04-11 DIAGNOSIS — Z20822 Contact with and (suspected) exposure to covid-19: Secondary | ICD-10-CM | POA: Diagnosis not present

## 2020-04-11 DIAGNOSIS — J189 Pneumonia, unspecified organism: Secondary | ICD-10-CM | POA: Diagnosis not present

## 2020-04-11 DIAGNOSIS — E1169 Type 2 diabetes mellitus with other specified complication: Secondary | ICD-10-CM | POA: Diagnosis not present

## 2020-04-11 DIAGNOSIS — R109 Unspecified abdominal pain: Secondary | ICD-10-CM | POA: Diagnosis not present

## 2020-04-11 DIAGNOSIS — I959 Hypotension, unspecified: Secondary | ICD-10-CM | POA: Diagnosis not present

## 2020-04-11 DIAGNOSIS — R059 Cough, unspecified: Secondary | ICD-10-CM | POA: Diagnosis not present

## 2020-04-11 DIAGNOSIS — Z6841 Body Mass Index (BMI) 40.0 and over, adult: Secondary | ICD-10-CM | POA: Diagnosis not present

## 2020-04-11 DIAGNOSIS — I5032 Chronic diastolic (congestive) heart failure: Secondary | ICD-10-CM | POA: Diagnosis not present

## 2020-04-11 DIAGNOSIS — Z86718 Personal history of other venous thrombosis and embolism: Secondary | ICD-10-CM | POA: Diagnosis not present

## 2020-04-11 DIAGNOSIS — I110 Hypertensive heart disease with heart failure: Secondary | ICD-10-CM | POA: Diagnosis not present

## 2020-04-11 DIAGNOSIS — R0902 Hypoxemia: Secondary | ICD-10-CM | POA: Diagnosis present

## 2020-04-11 DIAGNOSIS — Z886 Allergy status to analgesic agent status: Secondary | ICD-10-CM | POA: Diagnosis not present

## 2020-04-11 DIAGNOSIS — Z87891 Personal history of nicotine dependence: Secondary | ICD-10-CM | POA: Diagnosis not present

## 2020-04-11 DIAGNOSIS — M109 Gout, unspecified: Secondary | ICD-10-CM | POA: Diagnosis present

## 2020-04-11 DIAGNOSIS — Z7984 Long term (current) use of oral hypoglycemic drugs: Secondary | ICD-10-CM | POA: Diagnosis not present

## 2020-04-11 DIAGNOSIS — Z833 Family history of diabetes mellitus: Secondary | ICD-10-CM | POA: Diagnosis not present

## 2020-04-11 DIAGNOSIS — E890 Postprocedural hypothyroidism: Secondary | ICD-10-CM | POA: Diagnosis not present

## 2020-04-11 DIAGNOSIS — E785 Hyperlipidemia, unspecified: Secondary | ICD-10-CM | POA: Diagnosis not present

## 2020-04-11 DIAGNOSIS — R410 Disorientation, unspecified: Secondary | ICD-10-CM | POA: Diagnosis not present

## 2020-04-11 DIAGNOSIS — J440 Chronic obstructive pulmonary disease with acute lower respiratory infection: Secondary | ICD-10-CM | POA: Diagnosis not present

## 2020-04-11 LAB — CBC WITH DIFFERENTIAL/PLATELET
Abs Immature Granulocytes: 0.14 10*3/uL — ABNORMAL HIGH (ref 0.00–0.07)
Basophils Absolute: 0.1 10*3/uL (ref 0.0–0.1)
Basophils Relative: 0 %
Eosinophils Absolute: 0.3 10*3/uL (ref 0.0–0.5)
Eosinophils Relative: 1 %
HCT: 34.6 % — ABNORMAL LOW (ref 36.0–46.0)
Hemoglobin: 10.1 g/dL — ABNORMAL LOW (ref 12.0–15.0)
Immature Granulocytes: 1 %
Lymphocytes Relative: 12 %
Lymphs Abs: 2.7 10*3/uL (ref 0.7–4.0)
MCH: 28.5 pg (ref 26.0–34.0)
MCHC: 29.2 g/dL — ABNORMAL LOW (ref 30.0–36.0)
MCV: 97.5 fL (ref 80.0–100.0)
Monocytes Absolute: 1.2 10*3/uL — ABNORMAL HIGH (ref 0.1–1.0)
Monocytes Relative: 5 %
Neutro Abs: 18.1 10*3/uL — ABNORMAL HIGH (ref 1.7–7.7)
Neutrophils Relative %: 81 %
Platelets: 259 10*3/uL (ref 150–400)
RBC: 3.55 MIL/uL — ABNORMAL LOW (ref 3.87–5.11)
RDW: 14 % (ref 11.5–15.5)
WBC: 22.5 10*3/uL — ABNORMAL HIGH (ref 4.0–10.5)
nRBC: 0 % (ref 0.0–0.2)

## 2020-04-11 LAB — COMPREHENSIVE METABOLIC PANEL
ALT: 16 U/L (ref 0–44)
AST: 13 U/L — ABNORMAL LOW (ref 15–41)
Albumin: 3.1 g/dL — ABNORMAL LOW (ref 3.5–5.0)
Alkaline Phosphatase: 62 U/L (ref 38–126)
Anion gap: 12 (ref 5–15)
BUN: 9 mg/dL (ref 6–20)
CO2: 30 mmol/L (ref 22–32)
Calcium: 7.5 mg/dL — ABNORMAL LOW (ref 8.9–10.3)
Chloride: 98 mmol/L (ref 98–111)
Creatinine, Ser: 0.62 mg/dL (ref 0.44–1.00)
GFR, Estimated: >60 mL/min (ref >60)
Glucose, Bld: 104 mg/dL — ABNORMAL HIGH (ref 70–99)
Potassium: 3.6 mmol/L (ref 3.5–5.1)
Sodium: 140 mmol/L (ref 135–145)
Total Bilirubin: 0.6 mg/dL (ref 0.3–1.2)
Total Protein: 6.2 g/dL — ABNORMAL LOW (ref 6.5–8.1)

## 2020-04-11 LAB — URINALYSIS, ROUTINE W REFLEX MICROSCOPIC
Bilirubin Urine: NEGATIVE
Glucose, UA: NEGATIVE mg/dL
Hgb urine dipstick: NEGATIVE
Ketones, ur: NEGATIVE mg/dL
Leukocytes,Ua: NEGATIVE
Nitrite: NEGATIVE
Protein, ur: NEGATIVE mg/dL
Specific Gravity, Urine: 1.024 (ref 1.005–1.030)
pH: 5 (ref 5.0–8.0)

## 2020-04-11 LAB — CBG MONITORING, ED
Glucose-Capillary: 155 mg/dL — ABNORMAL HIGH (ref 70–99)
Glucose-Capillary: 105 mg/dL — ABNORMAL HIGH (ref 70–99)
Glucose-Capillary: 133 mg/dL — ABNORMAL HIGH (ref 70–99)
Glucose-Capillary: 164 mg/dL — ABNORMAL HIGH (ref 70–99)

## 2020-04-11 LAB — HIV ANTIBODY (ROUTINE TESTING W REFLEX): HIV Screen 4th Generation wRfx: NONREACTIVE

## 2020-04-11 LAB — PROCALCITONIN: Procalcitonin: 0.11 ng/mL

## 2020-04-11 MED ORDER — SODIUM CHLORIDE 0.9 % IV SOLN
500.0000 mg | Freq: Once | INTRAVENOUS | Status: AC
  Administered 2020-04-11: 500 mg via INTRAVENOUS
  Filled 2020-04-11: qty 500

## 2020-04-11 MED ORDER — FUROSEMIDE 20 MG PO TABS
20.0000 mg | ORAL_TABLET | Freq: Two times a day (BID) | ORAL | Status: DC
  Administered 2020-04-11 – 2020-04-12 (×3): 20 mg via ORAL
  Filled 2020-04-11 (×3): qty 1

## 2020-04-11 MED ORDER — ENOXAPARIN SODIUM 40 MG/0.4ML ~~LOC~~ SOLN
40.0000 mg | Freq: Every day | SUBCUTANEOUS | Status: DC
  Administered 2020-04-11 – 2020-04-13 (×3): 40 mg via SUBCUTANEOUS
  Filled 2020-04-11 (×3): qty 0.4

## 2020-04-11 MED ORDER — ONDANSETRON HCL 4 MG PO TABS: 4.0000 mg | ORAL_TABLET | Freq: Four times a day (QID) | ORAL | Status: DC | PRN

## 2020-04-11 MED ORDER — SODIUM CHLORIDE 0.9 % IV SOLN
500.0000 mg | INTRAVENOUS | Status: DC
  Administered 2020-04-12: 500 mg via INTRAVENOUS
  Filled 2020-04-11: qty 500

## 2020-04-11 MED ORDER — LEVOTHYROXINE SODIUM 112 MCG PO TABS
224.0000 ug | ORAL_TABLET | Freq: Every day | ORAL | Status: DC
  Administered 2020-04-11 – 2020-04-13 (×3): 224 ug via ORAL
  Filled 2020-04-11 (×3): qty 2

## 2020-04-11 MED ORDER — UMECLIDINIUM-VILANTEROL 62.5-25 MCG/INH IN AEPB
1.0000 | INHALATION_SPRAY | Freq: Every day | RESPIRATORY_TRACT | Status: DC
  Administered 2020-04-12 – 2020-04-13 (×2): 1 via RESPIRATORY_TRACT
  Filled 2020-04-11: qty 14

## 2020-04-11 MED ORDER — ALBUTEROL SULFATE HFA 108 (90 BASE) MCG/ACT IN AERS: 1.0000 | INHALATION_SPRAY | Freq: Four times a day (QID) | RESPIRATORY_TRACT | Status: DC | PRN

## 2020-04-11 MED ORDER — INSULIN ASPART 100 UNIT/ML ~~LOC~~ SOLN
0.0000 [IU] | Freq: Three times a day (TID) | SUBCUTANEOUS | Status: DC
  Administered 2020-04-11: 2 [IU] via SUBCUTANEOUS
  Administered 2020-04-11: 3 [IU] via SUBCUTANEOUS
  Administered 2020-04-13: 2 [IU] via SUBCUTANEOUS

## 2020-04-11 MED ORDER — ONDANSETRON HCL 4 MG/2ML IJ SOLN: 4.0000 mg | Freq: Four times a day (QID) | INTRAMUSCULAR | Status: DC | PRN

## 2020-04-11 MED ORDER — OXYCODONE HCL 5 MG PO TABS
10.0000 mg | ORAL_TABLET | Freq: Four times a day (QID) | ORAL | Status: DC | PRN
  Administered 2020-04-11 – 2020-04-13 (×6): 10 mg via ORAL
  Filled 2020-04-11 (×8): qty 2

## 2020-04-11 MED ORDER — FERROUS SULFATE 325 (65 FE) MG PO TABS
325.0000 mg | ORAL_TABLET | Freq: Every day | ORAL | Status: DC
  Administered 2020-04-11 – 2020-04-13 (×3): 325 mg via ORAL
  Filled 2020-04-11 (×3): qty 1

## 2020-04-11 MED ORDER — METOPROLOL TARTRATE 5 MG/5ML IV SOLN: 5.0000 mg | Freq: Four times a day (QID) | INTRAVENOUS | Status: DC | PRN

## 2020-04-11 MED ORDER — CEFTRIAXONE SODIUM 2 G IJ SOLR
2.0000 g | INTRAMUSCULAR | Status: DC
  Administered 2020-04-12 – 2020-04-13 (×2): 2 g via INTRAVENOUS
  Filled 2020-04-11 (×2): qty 20

## 2020-04-11 MED ORDER — IPRATROPIUM-ALBUTEROL 0.5-2.5 (3) MG/3ML IN SOLN: 3.0000 mL | RESPIRATORY_TRACT | Status: DC | PRN

## 2020-04-11 MED ORDER — GUAIFENESIN ER 600 MG PO TB12
600.0000 mg | ORAL_TABLET | Freq: Two times a day (BID) | ORAL | Status: DC
  Administered 2020-04-11 – 2020-04-13 (×5): 600 mg via ORAL
  Filled 2020-04-11 (×5): qty 1

## 2020-04-11 MED ORDER — SODIUM CHLORIDE 0.9 % IV SOLN
1.0000 g | Freq: Once | INTRAVENOUS | Status: AC
  Administered 2020-04-11: 1 g via INTRAVENOUS
  Filled 2020-04-11: qty 10

## 2020-04-11 NOTE — ED Notes (Signed)
Pt's lunch arrived 

## 2020-04-11 NOTE — ED Notes (Signed)
MS Breakfast Ordered 

## 2020-04-11 NOTE — H&P (Signed)
History and Physical  Patient Name: Michele Owens     GYK:[id number]    DOB: 1960-09-28    DOA: 04/10/2020 PCP: Everrett Coombe, MD  Patient coming from: Home  Chief Complaint: Multiple complaints including diffuse muscle skeletal pain, but primarily in her left knee.  Abdominal pain.    HPI: Michele Owens is a 60 y.o. female with hx of nocturnal hypoxia who presented to the hospital on 04/10/2020 secondary to pain, primarily left knee, but also complaining of abdominal pain, and was found to have leukocytosis.  Based on notes, patient initially presented with left knee pain.  However during my exam, patient is hard to keep on track and starts to labor on chronic medical issues she has.  She says she just does not feel well, having some nausea, abdominal pain, diffuse muscle skeletal pain but primarily focused on her left knee.  She is gets injections for this, last being about a month ago.  She also says she has gout but has not been on prednisone lately.  She is on chronic pain meds.  She says she has bone-on-bone in that knee but due to her extensive medical comorbidities, not a candidate for surgery.  She denies any shortness of breath during my exam.  She has a chronic cough.  She is on 3 L nasal cannula at night due to hypoxia.  Denies any dysuria.  Apparently she was more lethargic initially in the ER but is very alert when I saw her.  She is wanting some food.  ED course: -On initial presentation, heart rate 118, blood pressure 119/69, afebrile, maintaining sats on 2 L nasal cannula.  Blood pressure then dropped to 96/58. -Relevant labs on initial presentation: Sodium 140, potassium 4.3, bicarb 31, glucose 135, creatinine 0.83, lactic acid 1.5, WBC 32.2, hemoglobin 11.3, platelet 299, Covid negative -Chest x-ray showed no acute findings, CT abdomen pelvis with contrast was unrevealing for infectious etiology or acute processes. -ER provider gave her a liter of fluid, Rocephin, azithromycin  and contacted the hospitalist service for observation      ROS: A complete and thorough review of systems obtained, negative unless stated in the HPI      Past Medical History:  Diagnosis Date  . Anxiety   . Arthritis   . Asthma   . Chronic diastolic CHF (congestive heart failure) ([facility])   . COPD (chronic obstructive pulmonary disease) ([facility])    Uses Oxygen at night  . Depression   . Diabetes mellitus without complication ([facility])   . Dyspnea    occ  . Family history of thyroid cancer   . GERD (gastroesophageal reflux disease)   . Gout   . Headache    migraines  . History of DVT of lower extremity   . History of nuclear stress test    Myoview 2/17:  Low risk stress nuclear study with a small, moderate intensity, partially reversible inferior lateral defect consistent with small prior infarct and minimal peri-infarct ischemia; EF 68 with normal wall motion  . Hypertension   . Pneumonia   . Thyroid cancer ([facility]) 09/23/2016    Past Surgical History:  Procedure Laterality Date  . BUNIONECTOMY Bilateral   . CARDIAC CATHETERIZATION N/A 06/23/2015   Procedure: Right/Left Heart Cath and Coronary Angiography;  Surgeon: [name], MD;  Location: [facility];  Service: Cardiovascular;  Laterality: N/A;  . COLONOSCOPY WITH PROPOFOL N/A 12/15/2015   Procedure: COLONOSCOPY WITH PROPOFOL;  Surgeon: [name], MD;  Location:  [facility];  Service: Endoscopy;  Laterality: N/A;  . RIGHT HEART CATH N/A 10/01/2019   Procedure: RIGHT HEART CATH;  Surgeon: [name], MD;  Location: [facility];  Service: Cardiovascular;  Laterality: N/A;  . THYROIDECTOMY  09/19/2016  . THYROIDECTOMY N/A 09/19/2016   Procedure: TOTAL THYROIDECTOMY;  Surgeon: [name], MD;  Location: [facility];  Service: General;  Laterality: N/A;  . TONSILLECTOMY    . TOTAL ABDOMINAL HYSTERECTOMY  07/14/10    Social History: Patient lives at home.  Former smoker.  Allergies  Allergen Reactions  . Bee  Venom Swelling and Other (See Comments)    "All over my body" (swelling)  . Fioricet [Butalbital-Apap-Caffeine] Nausea And Vomiting and Rash  . Ibuprofen Rash and Other (See Comments)    Severe rash  . Lamisil [Terbinafine] Rash and Other (See Comments)    Pt states this causes her to "feel funny"  . Nsaids Other (See Comments)    Per MD's orders     Family history: family history includes Brain cancer in her paternal aunt; Cancer in her cousin, cousin, father, and maternal uncle; Diabetes in her mother; Thyroid cancer in her mother.  Prior to Admission medications   Medication Sig Start Date End Date Taking? Authorizing Provider  albuterol (PROVENTIL HFA;VENTOLIN HFA) 108 (90 Base) MCG/ACT inhaler Inhale 1-2 puffs into the lungs every 6 (six) hours as needed for wheezing or shortness of breath. 04/23/18   Long, [name], MD  allopurinol (ZYLOPRIM) 100 MG tablet Take 100 mg by mouth daily.    [provider]  ANORO ELLIPTA 62.5-25 MCG/INH AEPB Inhale 1 puff into the lungs daily.  04/11/18   [provider]  benzonatate (TESSALON) 200 MG capsule Take 200 mg by mouth 3 (three) times daily. Patient not taking: Reported on 01/14/2020 10/28/19   [provider]  Blood Glucose Monitoring Suppl (ACCU-CHEK AVIVA PLUS) w/Device KIT 1 each by Other route daily.  08/07/17   [provider]  butalbital-acetaminophen-caffeine (FIORICET, ESGIC) 50-325-40 MG tablet Take 1-2 tablets by mouth every 4 (four) hours as needed for migraine. Patient not taking: Reported on 01/14/2020 06/13/18   [provider]  calcitRIOL (ROCALTROL) 0.5 MCG capsule Take 1 capsule (0.5 mcg total) by mouth daily. 09/26/16   Hongalgi, [name], MD  carvedilol (COREG) 6.25 MG tablet Take 1 tablet (6.25 mg total) by mouth 2 (two) times daily with a meal. 01/23/18   Bensimhon, [name], MD  cetirizine (ZYRTEC) 10 MG tablet Take 10 mg by mouth daily. 07/10/17   [provider]  clonazePAM  (KLONOPIN) 2 MG tablet Take 1 tablet (2 mg total) by mouth 3 (three) times daily. for anxiety 06/23/19   [name], MD  cyclobenzaprine (FLEXERIL) 10 MG tablet Take 10 mg by mouth 3 (three) times daily. 11/19/19   [provider]  diclofenac sodium (VOLTAREN) 1 % GEL Apply 2 g topically 4 (four) times daily as needed (for pain). 06/19/18   [name], MD  dicyclomine (BENTYL) 20 MG tablet Take 1 tablet (20 mg total) by mouth every 6 (six) hours as needed for spasms. 07/11/19   Virgel Manifold, MD  EPINEPHrine 0.3 mg/0.3 mL IJ SOAJ injection Inject 0.3 mg into the muscle daily as needed (allergic reaction).    [provider]  FEROSUL 325 (65 Fe) MG tablet Take 325 mg by mouth daily. 03/22/17   [provider]  furosemide (LASIX) 20 MG tablet furosemide 20 mg tablet  TAKE  TWO TABLETS BY MOUTH IN THE MORNING AND 1 TABLET IN THE EVENING    [provider]  glucose blood test strip Use as instructed to check blood sugar up to 4 times daily Patient taking differently: 1 each by Other route See admin instructions. Use as instructed to check blood sugar up to 4 times daily 08/06/17   [name], DO  guaiFENesin (MUCINEX) 600 MG 12 hr tablet Take 2 tablets (1,200 mg total) by mouth 2 (two) times daily. 07/11/18   Florencia Reasons, MD  HYDROcodone-acetaminophen (NORCO/VICODIN) 5-325 MG tablet Take 1 tablet by mouth every 6 (six) hours as needed for moderate pain.  02/12/18   [provider]  hydrOXYzine (ATARAX/VISTARIL) 25 MG tablet Take 1 tablet (25 mg total) by mouth every 6 (six) hours. 07/08/19   Caccavale, Sophia, PA-C  ipratropium-albuterol (DUONEB) 0.5-2.5 (3) MG/3ML SOLN Take 3 mLs by nebulization 4 (four) times daily.  04/05/17   [provider]  levothyroxine (SYNTHROID, LEVOTHROID) 112 MCG tablet Take 224 mcg by mouth daily. 07/11/17   [provider]  lidocaine (LIDODERM) 5 % Place 1 patch onto the skin daily. Remove & Discard patch within  12 hours or as directed by MD 07/11/18   Florencia Reasons, MD  lisinopril (PRINIVIL,ZESTRIL) 5 MG tablet Take 1 tablet (5 mg total) by mouth daily. Please call for office visit [phone] 08/15/17   [name], MD  loperamide (IMODIUM) 2 MG capsule Take 1 capsule (2 mg total) by mouth 4 (four) times daily as needed for diarrhea or loose stools. 6/57/84   [name], MD  metFORMIN (GLUCOPHAGE) 500 MG tablet Take 1 tablet (500 mg total) by mouth 2 (two) times daily with a meal. 12/11/14   [name], MD  methocarbamol (ROBAXIN) 500 MG tablet Take 1 tablet (500 mg total) by mouth every 8 (eight) hours as needed for muscle spasms. 06/19/18   [name], MD  metoCLOPramide (REGLAN) 10 MG tablet Take 1 tablet (10 mg total) by mouth every 6 (six) hours as needed for nausea (or headache). 6/96/29   [name], MD  metoprolol succinate (TOPROL-XL) 25 MG 24 hr tablet Take 25 mg by mouth in the morning.  08/09/18   [provider]  montelukast (SINGULAIR) 10 MG tablet Take 1 tablet (10 mg total) by mouth at bedtime. 07/11/18   Florencia Reasons, MD  nitroGLYCERIN (NITROSTAT) 0.4 MG SL tablet Place 1 tablet (0.4 mg total) under the tongue every 5 (five) minutes as needed for chest pain. 06/19/18   [name], MD  nystatin cream (MYCOSTATIN) Apply 1 application topically 2 (two) times daily as needed for dry skin.  04/13/17   [provider]  omeprazole (PRILOSEC) 20 MG capsule Take by mouth 2 (two) times daily before a meal.  11/19/19   [provider]  oxyCODONE-acetaminophen (PERCOCET) 10-325 MG tablet Take 1 tablet by mouth every 6 (six) hours. 12/03/19   [provider]  OXYGEN Inhale 2 L into the lungs at bedtime.    [provider]  potassium chloride SA (K-DUR,KLOR-CON) 20 MEQ tablet Take 1 tablet (20 mEq total) by mouth 2 (two) times daily. 07/24/18   [name], MD  pravastatin (PRAVACHOL) 40 MG tablet Take 40 mg by mouth every evening.  03/29/15    [provider]  predniSONE (DELTASONE) 20 MG tablet 3 tabs po day one, then 2 po daily x 4 days 06/26/19   Mesner, Corene Cornea, MD  QC STOOL SOFTENER  PLS LAXATIVE 8.6-50 MG tablet Take 1 tablet by mouth at bedtime. 04/11/17   [provider]  rosuvastatin (CRESTOR) 10 MG tablet SMARTSIG:1 Tablet(s) By Mouth Every Evening 11/19/19   [provider]  tiotropium (SPIRIVA) 18 MCG inhalation capsule Place 18 mcg into inhaler and inhale daily.    [provider]  torsemide (DEMADEX) 20 MG tablet TAKE 4 (FOUR) TABLETS BY MOUTH DAILY (2AM+ 2NOON) 11/19/19   [name], MD  traZODone (DESYREL) 50 MG tablet Take 100 mg by mouth at bedtime. 11/19/19   [provider]       Physical Exam: BP (!) 104/57   Pulse 96   Temp 99 F (37.2 C) (Rectal)   Resp (!) 26   Ht '5\' 5"'  (1.651 m)   SpO2 97%   BMI 49.92 kg/m  General appearance: Well-developed, adult female, alert and in no acute distress .   Eyes: Anicteric, conjunctiva pink, lids and lashes normal. PERRL.    ENT: No nasal deformity, discharge, epistaxis.  Hearing within normal limits. OP moist without lesions.   Neck: No neck masses.  Trachea midline.  No thyromegaly/tenderness. Lymph: No cervical or supraclavicular lymphadenopathy. Skin: Warm and dry.  No jaundice.  No suspicious rashes or lesions. Cardiac: RRR, nl S1-S2, no murmurs appreciated.  Trace LE edema.  Radial and pedal pulses 2+ and symmetric. Respiratory: Diminished breath sounds bilaterally, no wheezing heard Abdomen: Abdomen soft.  Nontender.  Obese MSK: No deformities or effusions of the large joints of the upper or lower extremities bilaterally.  Left knee did not have any warmth. Neuro: Cranial nerves 212 grossly intact.  Sensation intact to light touch. Speech is fluent.    Psych: Sensorium intact and responding to questions, attention normal.  Behavior appropriate.  Histrionic     Labs on Admission:  I have personally reviewed  following labs and imaging studies: CBC: Recent Labs  Lab 04/10/20 1858 04/10/20 1954  WBC 32.2*  --   NEUTROABS 26.9*  --   HGB 11.3* 12.2  HCT 37.8 36.0  MCV 95.5  --   PLT 299  --    Basic Metabolic Panel: Recent Labs  Lab 04/10/20 1858 04/10/20 1954  NA 140 139  K 4.3 4.2  CL 96*  --   CO2 31  --   GLUCOSE 135*  --   BUN 15  --   CREATININE 0.83  --   CALCIUM 7.9*  --    GFR: CrCl cannot be calculated (Unknown ideal weight.).  Liver Function Tests: Recent Labs  Lab 04/10/20 1858  AST 17  ALT 19  ALKPHOS 72  BILITOT 0.6  PROT 7.5  ALBUMIN 3.7   Recent Labs  Lab 04/10/20 1845  LIPASE 22   No results for input(s): AMMONIA in the last 168 hours. Coagulation Profile: No results for input(s): INR, PROTIME in the last 168 hours. Cardiac Enzymes: No results for input(s): CKTOTAL, CKMB, CKMBINDEX, TROPONINI in the last 168 hours. BNP (last 3 results) No results for input(s): PROBNP in the last 8760 hours. HbA1C: No results for input(s): HGBA1C in the last 72 hours. CBG: Recent Labs  Lab 04/10/20 1825  GLUCAP 128*   Lipid Profile: No results for input(s): CHOL, HDL, LDLCALC, TRIG, CHOLHDL, LDLDIRECT in the last 72 hours. Thyroid Function Tests: No results for input(s): TSH, T4TOTAL, FREET4, T3FREE, THYROIDAB in the last 72 hours. Anemia Panel: No results for input(s): VITAMINB12, FOLATE, FERRITIN, TIBC, IRON, RETICCTPCT in the last 72 hours.  Recent Results (from the past 240 hour(s))  Resp Panel by RT-PCR (Flu A&B, Covid) Nasopharyngeal Swab     Status: None   Collection Time: 04/10/20  6:55 PM   Specimen: Nasopharyngeal Swab; Nasopharyngeal(NP) swabs in vial transport medium  Result Value Ref Range Status   SARS Coronavirus 2 by RT PCR NEGATIVE NEGATIVE Final    Comment: (NOTE) SARS-CoV-2 target nucleic acids are NOT DETECTED.  The SARS-CoV-2 RNA is generally detectable in upper respiratory specimens during the acute phase of infection. The  lowest concentration of SARS-CoV-2 viral copies this assay can detect is 138 copies/mL. A negative result does not preclude SARS-Cov-2 infection and should not be used as the sole basis for treatment or other patient management decisions. A negative result may occur with  improper specimen collection/handling, submission of specimen other than nasopharyngeal swab, presence of viral mutation(s) within the areas targeted by this assay, and inadequate number of viral copies(<138 copies/mL). A negative result must be combined with clinical observations, patient history, and epidemiological information. The expected result is Negative.  Fact Sheet for Patients:  EntrepreneurPulse.com.au  Fact Sheet for Healthcare Providers:  IncredibleEmployment.be  This test is no t yet approved or cleared by the [place] FDA and  has been authorized for detection and/or diagnosis of SARS-CoV-2 by FDA under an Emergency Use Authorization (EUA). This EUA will remain  in effect (meaning this test can be used) for the duration of the COVID-19 declaration under Section 564(b)(1) of the Act, 21 U.S.C.section 360bbb-3(b)(1), unless the authorization is terminated  or revoked sooner.       Influenza A by PCR NEGATIVE NEGATIVE Final   Influenza B by PCR NEGATIVE NEGATIVE Final    Comment: (NOTE) The Xpert Xpress SARS-CoV-2/FLU/RSV plus assay is intended as an aid in the diagnosis of influenza from Nasopharyngeal swab specimens and should not be used as a sole basis for treatment. Nasal washings and aspirates are unacceptable for Xpert Xpress SARS-CoV-2/FLU/RSV testing.  Fact Sheet for Patients: EntrepreneurPulse.com.au  Fact Sheet for Healthcare Providers: IncredibleEmployment.be  This test is not yet approved or cleared by the [place] FDA and has been authorized for detection and/or diagnosis of SARS-CoV-2 by FDA under  an Emergency Use Authorization (EUA). This EUA will remain in effect (meaning this test can be used) for the duration of the COVID-19 declaration under Section 564(b)(1) of the Act, 21 U.S.C. section 360bbb-3(b)(1), unless the authorization is terminated or revoked.  Performed at [facility], [place] [address], [place], [place] [address]            Radiological Exams on Admission: Personally reviewed: CT abdomen pelvis showed no previously acute abnormalities.  Chest x-ray showed poor lung expansion. DG Chest 1 View  Result Date: 04/10/2020 CLINICAL DATA:  Cough EXAM: CHEST  1 VIEW COMPARISON:  02/14/2020 FINDINGS: Shallow inspiration with infiltration or atelectasis in the lung bases. No pleural effusions. No pneumothorax. Degenerative changes in the spine. IMPRESSION: Shallow inspiration with infiltration or atelectasis in the lung bases. Electronically Signed   By: Lucienne Capers M.D.   On: 04/10/2020 20:29   CT ABDOMEN PELVIS W CONTRAST  Result Date: 04/10/2020 CLINICAL DATA:  Abdominal pain, fever, nausea, vomiting EXAM: CT ABDOMEN AND PELVIS WITH CONTRAST TECHNIQUE: Multidetector CT imaging of the abdomen and pelvis was performed using the standard protocol following bolus administration of intravenous contrast. CONTRAST:  17m OMNIPAQUE IOHEXOL 300 MG/ML  SOLN COMPARISON:  None. FINDINGS: Lower chest: Bibasilar atelectasis. The visualized heart and pericardium  are unremarkable. Hepatobiliary: Moderate hepatic steatosis. Mild hepatomegaly. No focal enhancing liver lesion. No intra or extrahepatic biliary ductal dilation. Gallbladder unremarkable. Pancreas: Unremarkable Spleen: Unremarkable Adrenals/Urinary Tract: Adrenal glands are unremarkable. Kidneys are normal, without renal calculi, focal lesion, or hydronephrosis. Bladder is unremarkable. Stomach/Bowel: Stomach is within normal limits. Appendix appears normal. No evidence of bowel wall thickening, distention, or inflammatory  changes. No free intraperitoneal gas or fluid. Vascular/Lymphatic: No significant vascular findings are present. No enlarged abdominal or pelvic lymph nodes. Reproductive: Status post hysterectomy. No adnexal masses. Other: Rectum unremarkable.  No abdominal wall hernia. Musculoskeletal: No acute bone abnormality. No lytic or blastic bone lesion. IMPRESSION: No acute intra-abdominal pathology identified. No radiographic explanation for the patient's reported symptoms. Moderate hepatic steatosis. Electronically Signed   By: Fidela Salisbury MD   On: 04/10/2020 23:52          Assessment/Plan   1.  Leukocytosis -On admission, WBC 32 -Specific etiology of leukocytosis unknown.  Chest x-ray showed no signs of pneumonia.  CT abdomen pelvis was unremarkable.  UA unimpressive for UTI.  She denies any recent steroid use.  She did get intra-articular injection but was approximately a month ago.  Possibly reactive, but additionally she does have a history of her WBC being above normal, albeit not this high -Did receive azithromycin and Rocephin in the ED, thus will cover her 24 hours, and will defer further antibiotics to day team -Repeat CBC with differential ordered   2.  Nocturnal hypoxia -Continue oxygen therapy at night, while sleeping   3.  Chronic pain -Complaining of diffuse musculoskeletal pain -Continue home pain regiment   4.  Hypotension -Initially patient systolic blood pressure noted in the 90s, improved with fluid bolus -Hold home BP meds for now   5.  History of thyroid cancer, status post thyroidectomy -Continue home Synthroid   6.  COPD, chronic, no signs of exacerbation -Continue home breathing regiment      DVT prophylaxis: Lovenox Code Status: Full Family Communication: Discussed care with daughter via phone Disposition Plan: Anticipate home Consults called: None Admission status: Observation   At the point of initial evaluation, it is my clinical opinion  that admission for OBSERVATION is reasonable and necessary because the patient's presenting complaints in the context of their chronic conditions represent sufficient risk of deterioration or significant morbidity to constitute reasonable grounds for close observation in the hospital setting, but that the patient may be medically stable for discharge from the hospital within 24 to 48 hours.    Medical decision making: Patient seen at 1:38 AM on 04/11/2020.  The patient was discussed with ER PA and patient's daughter.  What exists of the patient's chart was reviewed in depth and summarized above.  Clinical condition: Fair.        Doran Heater [facility] Please page though [place] or Epic secure chat:  For password, contact charge nurse

## 2020-04-11 NOTE — ED Notes (Signed)
Pt was brought to purple and she stated that her bottle of hand sanitizer did not follow. That was accurate b/c I was able to go find it in the room.  She also states that she had 2 piull bottles in her purse.  One had "calm down pills" and the other "oxycodone".  I helped her to look in her purse and could not find them.  I also looked in her previous room ([facility]) and did not find them there.

## 2020-04-11 NOTE — Progress Notes (Signed)
PROGRESS NOTE    Michele Owens  BMW:[id number] DOB: April 07, 1961 DOA: 04/10/2020 PCP: Sherral Hammers, MD   Brief Narrative: 60 year old with past medical history significant for nocturnal hypoxia who presented to the hospital complaining of left knee pain and abdominal pain was found to have leukocytosis. She presented complaining of nausea, abdominal pain, knee pain.  She also report cough. She is on chronic 3 L of oxygen at home due to hypoxemia. Evaluation in the ED CT abdomen and pelvis was negative for acute infectious process or acute abnormality, white blood cell 32.  Chest x-ray showed atelectasis versus infiltration the lung bases. Patient admitted with leukocytosis, she received a dose of IV antibiotics.    Assessment & Plan:   Active Problems:   Leukocytosis   1-community-acquired pneumonia: Patient presented with leukocytosis, cough, chest x-ray with infiltrates versus atelectasis at the bases. -She received a dose of IV ceftriaxone and azithromycin. -Her white blood cell has decreased to 22. -I will continue with IV antibiotics, ceftriaxone and azithromycin.  2-leukocytosis: Likely secondary to pneumonia.  Knee does not appear to be infected.  3-Knee pain; has improved.  4-chronic pain: Continue with home regimen  5-Hypothyroidism: She had a transient hypotension in the ED improved with fluids  History of thyroid cancer status post thyroidectomy: Continue with Synthroid  COPD: No exacerbation monitor Continue with Anoro   Estimated body mass index is 49.92 kg/m as calculated from the following:   Height as of this encounter: 5\' 5"  (1.651 m).   Weight as of 02/14/20: 136.1 kg.   DVT prophylaxis: Lovenox Code Status: Full code Family Communication: Discussed with patient Disposition Plan:  Status is: Inpatient  Dispo: The patient is from: Home              Anticipated d/c is to: Home              Anticipated d/c date is: 2 days              Patient  currently is not medically stable to d/c.        Consultants:   None  Procedures:   None  Antimicrobials:  Ceftriaxone azithromycin  Subjective: Patient is alert and conversant, she report cough.  She reports knee pain has improved.  Objective: Vitals:   04/11/20 0532 04/11/20 0600 04/11/20 0700 04/11/20 0824  BP: (!) 123/52 (!) 111/58 (!) 114/58 (!) 139/103  Pulse: 100 99 92 99  Resp: 19 17 18 14   Temp:    98.7 F (37.1 C)  TempSrc:    Oral  SpO2: 93% 93% 92% 98%  Height:       No intake or output data in the 24 hours ending 04/11/20 0840 There were no vitals filed for this visit.  Examination:  General exam: Appears calm and comfortable  Respiratory system: Bilateral rhonchorous Cardiovascular system: S1 & S2 heard, RRR. No JVD, murmurs, rubs, gallops or clicks. No pedal edema. Gastrointestinal system: Abdomen is nondistended, soft and nontender. No organomegaly or masses felt. Normal bowel sounds heard. Central nervous system: Alert and oriented. No focal neurological deficits. Extremities: Symmetric 5 x 5 power. Skin: No rashes, lesions or ulcers Psychiatry: Judgement and insight appear normal. Mood & affect appropriate.     Data Reviewed: I have personally reviewed following labs and imaging studies  CBC: Recent Labs  Lab 04/10/20 1858 04/10/20 1954 04/11/20 0750  WBC 32.2*  --  22.5*  NEUTROABS 26.9*  --  18.1*  HGB 11.3* 12.2 10.1*  HCT 37.8 36.0 34.6*  MCV 95.5  --  97.5  PLT 299  --  [id number]   Basic Metabolic Panel: Recent Labs  Lab 04/10/20 1858 04/10/20 1954  NA 140 139  K 4.3 4.2  CL 96*  --   CO2 31  --   GLUCOSE 135*  --   BUN 15  --   CREATININE 0.83  --   CALCIUM 7.9*  --    GFR: CrCl cannot be calculated (Unknown ideal weight.). Liver Function Tests: Recent Labs  Lab 04/10/20 1858  AST 17  ALT 19  ALKPHOS 72  BILITOT 0.6  PROT 7.5  ALBUMIN 3.7   Recent Labs  Lab 04/10/20 1845  LIPASE 22   No results for  input(s): AMMONIA in the last 168 hours. Coagulation Profile: No results for input(s): INR, PROTIME in the last 168 hours. Cardiac Enzymes: No results for input(s): CKTOTAL, CKMB, CKMBINDEX, TROPONINI in the last 168 hours. BNP (last 3 results) No results for input(s): PROBNP in the last 8760 hours. HbA1C: No results for input(s): HGBA1C in the last 72 hours. CBG: Recent Labs  Lab 04/10/20 1825 04/11/20 0348 04/11/20 0748  GLUCAP 128* 155* 105*   Lipid Profile: No results for input(s): CHOL, HDL, LDLCALC, TRIG, CHOLHDL, LDLDIRECT in the last 72 hours. Thyroid Function Tests: No results for input(s): TSH, T4TOTAL, FREET4, T3FREE, THYROIDAB in the last 72 hours. Anemia Panel: No results for input(s): VITAMINB12, FOLATE, FERRITIN, TIBC, IRON, RETICCTPCT in the last 72 hours. Sepsis Labs: Recent Labs  Lab 04/10/20 1858  LATICACIDVEN 1.5    Recent Results (from the past 240 hour(s))  Resp Panel by RT-PCR (Flu A&B, Covid) Nasopharyngeal Swab     Status: None   Collection Time: 04/10/20  6:55 PM   Specimen: Nasopharyngeal Swab; Nasopharyngeal(NP) swabs in vial transport medium  Result Value Ref Range Status   SARS Coronavirus 2 by RT PCR NEGATIVE NEGATIVE Final    Comment: (NOTE) SARS-CoV-2 target nucleic acids are NOT DETECTED.  The SARS-CoV-2 RNA is generally detectable in upper respiratory specimens during the acute phase of infection. The lowest concentration of SARS-CoV-2 viral copies this assay can detect is 138 copies/mL. A negative result does not preclude SARS-Cov-2 infection and should not be used as the sole basis for treatment or other patient management decisions. A negative result may occur with  improper specimen collection/handling, submission of specimen other than nasopharyngeal swab, presence of viral mutation(s) within the areas targeted by this assay, and inadequate number of viral copies(<138 copies/mL). A negative result must be combined with clinical  observations, patient history, and epidemiological information. The expected result is Negative.  Fact Sheet for Patients:  EntrepreneurPulse.com.au  Fact Sheet for Healthcare Providers:  IncredibleEmployment.be  This test is no t yet approved or cleared by the [place] FDA and  has been authorized for detection and/or diagnosis of SARS-CoV-2 by FDA under an Emergency Use Authorization (EUA). This EUA will remain  in effect (meaning this test can be used) for the duration of the COVID-19 declaration under Section 564(b)(1) of the Act, 21 U.S.C.section 360bbb-3(b)(1), unless the authorization is terminated  or revoked sooner.       Influenza A by PCR NEGATIVE NEGATIVE Final   Influenza B by PCR NEGATIVE NEGATIVE Final    Comment: (NOTE) The Xpert Xpress SARS-CoV-2/FLU/RSV plus assay is intended as an aid in the diagnosis of influenza from Nasopharyngeal swab specimens and should not be used as a sole basis for treatment. Nasal washings  and aspirates are unacceptable for Xpert Xpress SARS-CoV-2/FLU/RSV testing.  Fact Sheet for Patients: EntrepreneurPulse.com.au  Fact Sheet for Healthcare Providers: IncredibleEmployment.be  This test is not yet approved or cleared by the [place] FDA and has been authorized for detection and/or diagnosis of SARS-CoV-2 by FDA under an Emergency Use Authorization (EUA). This EUA will remain in effect (meaning this test can be used) for the duration of the COVID-19 declaration under Section 564(b)(1) of the Act, 21 U.S.C. section 360bbb-3(b)(1), unless the authorization is terminated or revoked.  Performed at [facility], [place] [address] [place], [place] [address]          Radiology Studies: DG Chest 1 View  Result Date: 04/10/2020 CLINICAL DATA:  Cough EXAM: CHEST  1 VIEW COMPARISON:  02/14/2020 FINDINGS: Shallow inspiration with infiltration or  atelectasis in the lung bases. No pleural effusions. No pneumothorax. Degenerative changes in the spine. IMPRESSION: Shallow inspiration with infiltration or atelectasis in the lung bases. Electronically Signed   By: Lucienne Capers M.D.   On: 04/10/2020 20:29   CT ABDOMEN PELVIS W CONTRAST  Result Date: 04/10/2020 CLINICAL DATA:  Abdominal pain, fever, nausea, vomiting EXAM: CT ABDOMEN AND PELVIS WITH CONTRAST TECHNIQUE: Multidetector CT imaging of the abdomen and pelvis was performed using the standard protocol following bolus administration of intravenous contrast. CONTRAST:  175mL OMNIPAQUE IOHEXOL 300 MG/ML  SOLN COMPARISON:  None. FINDINGS: Lower chest: Bibasilar atelectasis. The visualized heart and pericardium are unremarkable. Hepatobiliary: Moderate hepatic steatosis. Mild hepatomegaly. No focal enhancing liver lesion. No intra or extrahepatic biliary ductal dilation. Gallbladder unremarkable. Pancreas: Unremarkable Spleen: Unremarkable Adrenals/Urinary Tract: Adrenal glands are unremarkable. Kidneys are normal, without renal calculi, focal lesion, or hydronephrosis. Bladder is unremarkable. Stomach/Bowel: Stomach is within normal limits. Appendix appears normal. No evidence of bowel wall thickening, distention, or inflammatory changes. No free intraperitoneal gas or fluid. Vascular/Lymphatic: No significant vascular findings are present. No enlarged abdominal or pelvic lymph nodes. Reproductive: Status post hysterectomy. No adnexal masses. Other: Rectum unremarkable.  No abdominal wall hernia. Musculoskeletal: No acute bone abnormality. No lytic or blastic bone lesion. IMPRESSION: No acute intra-abdominal pathology identified. No radiographic explanation for the patient's reported symptoms. Moderate hepatic steatosis. Electronically Signed   By: Fidela Salisbury MD   On: 04/10/2020 23:52        Scheduled Meds: . enoxaparin (LOVENOX) injection  40 mg Subcutaneous Daily  . ferrous sulfate  325 mg  Oral Daily  . guaiFENesin  600 mg Oral BID  . insulin aspart  0-15 Units Subcutaneous TID WC  . levothyroxine  224 mcg Oral Daily  . oxyCODONE  5 mg Oral Once  . oxyCODONE-acetaminophen  1 tablet Oral Once  . umeclidinium-vilanterol  1 puff Inhalation Daily   Continuous Infusions: . [START ON 04/12/2020] azithromycin    . [START ON 04/12/2020] cefTRIAXone (ROCEPHIN)  IV       LOS: 0 days    Time spent: 35 minutes    Trace Wirick A Ehren Berisha, MD [facility]   If 7PM-7AM, please contact night-coverage [email or web address]  04/11/2020, 8:40 AM

## 2020-04-11 NOTE — ED Notes (Signed)
RN attempted to educate on new medication however Pt became very anxious about RN starting new antibiotics w/o her Daughter knowing.  She called her daughter 6 times before she answered.  RN explained to both new antibotics and they agreed that she would take them.

## 2020-04-11 NOTE — ED Notes (Signed)
Pt insisted on using the bedside comode, attempting to get up on her own however she is very weak.  RN and tech instructed her that it was very dangerous for her to get up until she is stronger.  She agreed.

## 2020-04-11 NOTE — ED Notes (Signed)
Pt's CBG result was 133. Informed Katelynn - RN.

## 2020-04-12 DIAGNOSIS — D72829 Elevated white blood cell count, unspecified: Secondary | ICD-10-CM | POA: Diagnosis not present

## 2020-04-12 LAB — CBC
HCT: 32.2 % — ABNORMAL LOW (ref 36.0–46.0)
Hemoglobin: 10.2 g/dL — ABNORMAL LOW (ref 12.0–15.0)
MCH: 30 pg (ref 26.0–34.0)
MCHC: 31.7 g/dL (ref 30.0–36.0)
MCV: 94.7 fL (ref 80.0–100.0)
Platelets: 264 10*3/uL (ref 150–400)
RBC: 3.4 MIL/uL — ABNORMAL LOW (ref 3.87–5.11)
RDW: 13.6 % (ref 11.5–15.5)
WBC: 15.9 10*3/uL — ABNORMAL HIGH (ref 4.0–10.5)
nRBC: 0 % (ref 0.0–0.2)

## 2020-04-12 LAB — URINE CULTURE: Culture: <10000 — AB

## 2020-04-12 LAB — GLUCOSE, CAPILLARY
Glucose-Capillary: 114 mg/dL — ABNORMAL HIGH (ref 70–99)
Glucose-Capillary: 101 mg/dL — ABNORMAL HIGH (ref 70–99)
Glucose-Capillary: 137 mg/dL — ABNORMAL HIGH (ref 70–99)
Glucose-Capillary: 118 mg/dL — ABNORMAL HIGH (ref 70–99)

## 2020-04-12 LAB — BASIC METABOLIC PANEL
Anion gap: 13 (ref 5–15)
BUN: 6 mg/dL (ref 6–20)
CO2: 29 mmol/L (ref 22–32)
Calcium: 7.9 mg/dL — ABNORMAL LOW (ref 8.9–10.3)
Chloride: 95 mmol/L — ABNORMAL LOW (ref 98–111)
Creatinine, Ser: 0.66 mg/dL (ref 0.44–1.00)
GFR, Estimated: >60 mL/min (ref >60)
Glucose, Bld: 189 mg/dL — ABNORMAL HIGH (ref 70–99)
Potassium: 3.4 mmol/L — ABNORMAL LOW (ref 3.5–5.1)
Sodium: 137 mmol/L (ref 135–145)

## 2020-04-12 LAB — HEMOGLOBIN A1C
Hgb A1c MFr Bld: 6.5 % — ABNORMAL HIGH (ref 4.8–5.6)
Mean Plasma Glucose: 139.85 mg/dL

## 2020-04-12 LAB — PROCALCITONIN: Procalcitonin: <0.1 ng/mL

## 2020-04-12 MED ORDER — CARVEDILOL 6.25 MG PO TABS
6.2500 mg | ORAL_TABLET | Freq: Two times a day (BID) | ORAL | Status: DC
  Administered 2020-04-12 – 2020-04-13 (×2): 6.25 mg via ORAL
  Filled 2020-04-12 (×2): qty 1

## 2020-04-12 MED ORDER — UMECLIDINIUM-VILANTEROL 62.5-25 MCG/INH IN AEPB: 1.0000 | INHALATION_SPRAY | Freq: Every day | RESPIRATORY_TRACT | Status: DC

## 2020-04-12 MED ORDER — POTASSIUM CHLORIDE CRYS ER 20 MEQ PO TBCR
40.0000 meq | EXTENDED_RELEASE_TABLET | Freq: Once | ORAL | Status: AC
  Administered 2020-04-12: 40 meq via ORAL
  Filled 2020-04-12: qty 2

## 2020-04-12 MED ORDER — CLONAZEPAM 1 MG PO TABS
2.0000 mg | ORAL_TABLET | Freq: Three times a day (TID) | ORAL | Status: DC | PRN
  Administered 2020-04-12 (×3): 2 mg via ORAL
  Filled 2020-04-12 (×3): qty 2

## 2020-04-12 MED ORDER — TORSEMIDE 20 MG PO TABS
80.0000 mg | ORAL_TABLET | Freq: Two times a day (BID) | ORAL | Status: DC
  Administered 2020-04-12 – 2020-04-13 (×2): 80 mg via ORAL
  Filled 2020-04-12 (×2): qty 4

## 2020-04-12 MED ORDER — METOCLOPRAMIDE HCL 5 MG PO TABS
10.0000 mg | ORAL_TABLET | Freq: Four times a day (QID) | ORAL | Status: DC | PRN
  Administered 2020-04-12: 10 mg via ORAL
  Filled 2020-04-12 (×2): qty 2

## 2020-04-12 MED ORDER — TRAZODONE HCL 100 MG PO TABS
100.0000 mg | ORAL_TABLET | Freq: Every day | ORAL | Status: DC
  Administered 2020-04-12: 100 mg via ORAL
  Filled 2020-04-12: qty 1

## 2020-04-12 MED ORDER — AZITHROMYCIN 500 MG PO TABS
500.0000 mg | ORAL_TABLET | Freq: Every day | ORAL | Status: DC
  Administered 2020-04-13: 500 mg via ORAL
  Filled 2020-04-12: qty 1

## 2020-04-12 MED ORDER — ALLOPURINOL 100 MG PO TABS
100.0000 mg | ORAL_TABLET | Freq: Every day | ORAL | Status: DC
  Administered 2020-04-12 – 2020-04-13 (×2): 100 mg via ORAL
  Filled 2020-04-12 (×2): qty 1

## 2020-04-12 NOTE — Progress Notes (Signed)
New Admission Note:  Arrival Method: By bed from ED around 2100 Mental Orientation: Alert and oriented Telemetry: None Assessment: Completed Skin: Completed, refer to flowsheets IV: Right forearm S.L. Pain: Denies Tubes: None Safety Measures: Safety Fall Prevention Plan was given, discussed and signed. Admission: Completed [facility] Orientation: Patient has been orientated to the room, unit and the staff. Family: None  Orders have been reviewed and implemented. Will continue to monitor the patient. Call light has been placed within reach and bed alarm has been activated.   Alfonse Ras, RN  Phone Number: [phone]

## 2020-04-12 NOTE — Progress Notes (Signed)
Ok to add 5d stop date to ceftriaxone/zith for PNA per Dr. Sunnie Nielsen.  Ulyses Southward, PharmD, BCIDP, AAHIVP, CPP Infectious Disease Pharmacist 04/12/2020 11:30 AM

## 2020-04-12 NOTE — Evaluation (Signed)
Physical Therapy Evaluation Patient Details Name: Michele Owens MRN: [id number] DOB: Oct 02, 1960 Today's Date: 04/12/2020   History of Present Illness  Michele Owens is a 60 y.o. female with hx of DM, obesity, nocturnal hypoxia who presented to the hospital on 04/10/2020 secondary to pain, primarily left knee, but also complaining of abdominal pain, and was found to have leukocytosis.  Clinical Impression  Patient received in bathroom. She is ambulating in room independently with SPC. Patient is agreeable to PT assessment. She performed all mobility including gait with mod independence. Appears to be near baseline level of mobility. Patient will continue to benefit from skilled PT while here to improve functional mobility and strength.      Follow Up Recommendations No PT follow up    Equipment Recommendations  None recommended by PT    Recommendations for Other Services       Precautions / Restrictions Precautions Precaution Comments: mod fall Restrictions Weight Bearing Restrictions: No      Mobility  Bed Mobility Overal bed mobility: Modified Independent                  Transfers Overall transfer level: Modified independent Equipment used: Straight cane             General transfer comment: transfers with SPC, no assistance  Ambulation/Gait Ambulation/Gait assistance: Modified independent (Device/Increase time) Gait Distance (Feet): 200 Feet Assistive device: Straight cane Gait Pattern/deviations: Wide base of support;Step-through pattern;Decreased step length - right;Decreased step length - left Gait velocity: decreased   General Gait Details: generally safe. SOB after walking without O2, good safety awareness  Stairs            Wheelchair Mobility    Modified Rankin (Stroke Patients Only)       Balance Overall balance assessment: Modified Independent                                           Pertinent Vitals/Pain Pain  Assessment: No/denies pain    Home Living Family/patient expects to be discharged to:: Private residence Living Arrangements: Alone Available Help at Discharge: Family;Available PRN/intermittently Type of Home: Apartment Home Access: Level entry     Home Layout: One level Home Equipment: Transport planner;Shower seat - built in;Cane - single point;Walker - 2 wheels;Bedside commode Additional Comments: home O2    Prior Function Level of Independence: Independent with assistive device(s)               Hand Dominance        Extremity/Trunk Assessment   Upper Extremity Assessment Upper Extremity Assessment: Overall WFL for tasks assessed    Lower Extremity Assessment Lower Extremity Assessment: Overall WFL for tasks assessed    Cervical / Trunk Assessment Cervical / Trunk Assessment: Normal  Communication   Communication: No difficulties  Cognition Arousal/Alertness: Awake/alert Behavior During Therapy: WFL for tasks assessed/performed Overall Cognitive Status: Within Functional Limits for tasks assessed                                        General Comments      Exercises     Assessment/Plan    PT Assessment Patient needs continued PT services  PT Problem List Decreased strength;Decreased activity tolerance;Decreased mobility;Cardiopulmonary status limiting activity       PT  Treatment Interventions DME instruction;Therapeutic activities;Gait training;Therapeutic exercise;Functional mobility training;Patient/family education    PT Goals (Current goals can be found in the Care Plan section)  Acute Rehab PT Goals Patient Stated Goal: to return home PT Goal Formulation: With patient Time For Goal Achievement: 04/19/20 Potential to Achieve Goals: Good    Frequency Min 3X/week   Barriers to discharge Decreased caregiver support      Co-evaluation               AM-PAC PT "6 Clicks" Mobility  Outcome Measure Help needed turning  from your back to your side while in a flat bed without using bedrails?: None Help needed moving from lying on your back to sitting on the side of a flat bed without using bedrails?: None Help needed moving to and from a bed to a chair (including a wheelchair)?: None Help needed standing up from a chair using your arms (e.g., wheelchair or bedside chair)?: None Help needed to walk in hospital room?: None Help needed climbing 3-5 steps with a railing? : A Little 6 Click Score: 23    End of Session Equipment Utilized During Treatment: Oxygen Activity Tolerance: Patient tolerated treatment well Patient left: in bed;with call bell/phone within reach Nurse Communication: Mobility status PT Visit Diagnosis: Muscle weakness (generalized) (M62.81);Difficulty in walking, not elsewhere classified (R26.2)    Time: 1856-3149 PT Time Calculation (min) (ACUTE ONLY): 36 min   Charges:   PT Evaluation $PT Eval Moderate Complexity: 1 Mod PT Treatments $Gait Training: 8-22 mins        Bueford Arp, PT, GCS 04/12/20,2:53 PM

## 2020-04-12 NOTE — Progress Notes (Signed)
PROGRESS NOTE    Michele Owens  [id number] DOB: Apr 15, 1960 DOA: 04/10/2020 PCP: Everrett Coombe, MD   Brief Narrative: 60 year old with past medical history significant for nocturnal hypoxia who presented to the hospital complaining of left knee pain and abdominal pain was found to have leukocytosis. She presented complaining of nausea, abdominal pain, knee pain.  She also report cough. She is on chronic 3 L of oxygen at home due to hypoxemia. Evaluation in the ED CT abdomen and pelvis was negative for acute infectious process or acute abnormality, white blood cell 32.  Chest x-ray showed atelectasis versus infiltration the lung bases. Patient admitted with leukocytosis, she received a dose of IV antibiotics.    Assessment & Plan:   Active Problems:   Leukocytosis   1-Community-Acquired Pneumonia: Patient presented with leukocytosis, cough, chest x-ray with infiltrates versus atelectasis at the bases. -She received a dose of IV ceftriaxone and azithromycin. -Her white blood cell has decreased to  15 -Continue with IV antibiotics, ceftriaxone and azithromycin for 5 days.   2-leukocytosis: Likely secondary to pneumonia.  Knee does not appear to be infected. Resolving.   3-Knee pain; has improved.  4-Chronic pain: Continue with home regimen  5-Hypothyroidism: She had a transient hypotension in the ED improved with fluids  History of thyroid cancer status post thyroidectomy: Continue with Synthroid  COPD: No exacerbation monitor Continue with Anoro  Chronic diastolic HF; resume torsemide   Estimated body mass index is 49.92 kg/m as calculated from the following:   Height as of this encounter: 5\' 5"  (1.651 m).   Weight as of 02/14/20: 136.1 kg.   DVT prophylaxis: Lovenox Code Status: Full code Family Communication: Discussed with patient Disposition Plan:  Status is: Inpatient  Dispo: The patient is from: Home              Anticipated d/c is to: Home               Anticipated d/c date is: 1 day              Patient currently is not medically stable to d/c.She does not have power at home. She uses 3 L oxygen/ plan to discharge home tomorrow 1/04 if she gets power.         Consultants:   None  Procedures:   None  Antimicrobials:  Ceftriaxone azithromycin  Subjective: Cough improving.  Denies worsening dyspnea.   Objective: Vitals:   04/11/20 1912 04/11/20 2125 04/12/20 0530 04/12/20 1046  BP: (!) 141/86 124/78 (!) 141/78 111/82  Pulse: 92 90 90 87  Resp: 16 18 18 19   Temp: 98.2 F (36.8 C) 97.8 F (36.6 C) 98.4 F (36.9 C) 97.9 F (36.6 C)  TempSrc: Oral Oral    SpO2: 99% 97% 97% 97%  Height:        Intake/Output Summary (Last 24 hours) at 04/12/2020 1351 Last data filed at 04/12/2020 1300 Gross per 24 hour  Intake 820.07 ml  Output 0 ml  Net 820.07 ml   There were no vitals filed for this visit.  Examination:  General exam: NAD Respiratory system: Bilateral ronchus  Cardiovascular system: S 1, S 2 RRR Gastrointestinal system: BS present, soft, nt Central nervous system: non focal.  Extremities: no edema  Data Reviewed: I have personally reviewed following labs and imaging studies  CBC: Recent Labs  Lab 04/10/20 1858 04/10/20 1954 04/11/20 0750 04/12/20 0836  WBC 32.2*  --  22.5* 15.9*  NEUTROABS 26.9*  --  18.1*  --   HGB 11.3* 12.2 10.1* 10.2*  HCT 37.8 36.0 34.6* 32.2*  MCV 95.5  --  97.5 94.7  PLT 299  --  259 [id number]   Basic Metabolic Panel: Recent Labs  Lab 04/10/20 1858 04/10/20 1954 04/11/20 0750 04/12/20 0836  NA 140 139 140 137  K 4.3 4.2 3.6 3.4*  CL 96*  --  98 95*  CO2 31  --  30 29  GLUCOSE 135*  --  104* 189*  BUN 15  --  9 6  CREATININE 0.83  --  0.62 0.66  CALCIUM 7.9*  --  7.5* 7.9*   GFR: CrCl cannot be calculated (Unknown ideal weight.). Liver Function Tests: Recent Labs  Lab 04/10/20 1858 04/11/20 0750  AST 17 13*  ALT 19 16  ALKPHOS 72 62  BILITOT 0.6 0.6  PROT  7.5 6.2*  ALBUMIN 3.7 3.1*   Recent Labs  Lab 04/10/20 1845  LIPASE 22   No results for input(s): AMMONIA in the last 168 hours. Coagulation Profile: No results for input(s): INR, PROTIME in the last 168 hours. Cardiac Enzymes: No results for input(s): CKTOTAL, CKMB, CKMBINDEX, TROPONINI in the last 168 hours. BNP (last 3 results) No results for input(s): PROBNP in the last 8760 hours. HbA1C: Recent Labs    04/11/20 0750  HGBA1C 6.5*   CBG: Recent Labs  Lab 04/11/20 0748 04/11/20 1244 04/11/20 1808 04/12/20 0718 04/12/20 1154  GLUCAP 105* 133* 164* 114* 101*   Lipid Profile: No results for input(s): CHOL, HDL, LDLCALC, TRIG, CHOLHDL, LDLDIRECT in the last 72 hours. Thyroid Function Tests: No results for input(s): TSH, T4TOTAL, FREET4, T3FREE, THYROIDAB in the last 72 hours. Anemia Panel: No results for input(s): VITAMINB12, FOLATE, FERRITIN, TIBC, IRON, RETICCTPCT in the last 72 hours. Sepsis Labs: Recent Labs  Lab 04/10/20 1858 04/11/20 0750 04/12/20 0245  PROCALCITON  --  0.11 <0.10  LATICACIDVEN 1.5  --   --     Recent Results (from the past 240 hour(s))  Resp Panel by RT-PCR (Flu A&B, Covid) Nasopharyngeal Swab     Status: None   Collection Time: 04/10/20  6:55 PM   Specimen: Nasopharyngeal Swab; Nasopharyngeal(NP) swabs in vial transport medium  Result Value Ref Range Status   SARS Coronavirus 2 by RT PCR NEGATIVE NEGATIVE Final    Comment: (NOTE) SARS-CoV-2 target nucleic acids are NOT DETECTED.  The SARS-CoV-2 RNA is generally detectable in upper respiratory specimens during the acute phase of infection. The lowest concentration of SARS-CoV-2 viral copies this assay can detect is 138 copies/mL. A negative result does not preclude SARS-Cov-2 infection and should not be used as the sole basis for treatment or other patient management decisions. A negative result may occur with  improper specimen collection/handling, submission of specimen other than  nasopharyngeal swab, presence of viral mutation(s) within the areas targeted by this assay, and inadequate number of viral copies(<138 copies/mL). A negative result must be combined with clinical observations, patient history, and epidemiological information. The expected result is Negative.  Fact Sheet for Patients:  EntrepreneurPulse.com.au  Fact Sheet for Healthcare Providers:  IncredibleEmployment.be  This test is no t yet approved or cleared by the [place] FDA and  has been authorized for detection and/or diagnosis of SARS-CoV-2 by FDA under an Emergency Use Authorization (EUA). This EUA will remain  in effect (meaning this test can be used) for the duration of the COVID-19 declaration under Section 564(b)(1) of the Act, 21 U.S.C.section 360bbb-3(b)(1), unless the  authorization is terminated  or revoked sooner.       Influenza A by PCR NEGATIVE NEGATIVE Final   Influenza B by PCR NEGATIVE NEGATIVE Final    Comment: (NOTE) The Xpert Xpress SARS-CoV-2/FLU/RSV plus assay is intended as an aid in the diagnosis of influenza from Nasopharyngeal swab specimens and should not be used as a sole basis for treatment. Nasal washings and aspirates are unacceptable for Xpert Xpress SARS-CoV-2/FLU/RSV testing.  Fact Sheet for Patients: BloggerCourse.com  Fact Sheet for Healthcare Providers: SeriousBroker.it  This test is not yet approved or cleared by the [place] FDA and has been authorized for detection and/or diagnosis of SARS-CoV-2 by FDA under an Emergency Use Authorization (EUA). This EUA will remain in effect (meaning this test can be used) for the duration of the COVID-19 declaration under Section 564(b)(1) of the Act, 21 U.S.C. section 360bbb-3(b)(1), unless the authorization is terminated or revoked.  Performed at [facility], [address], [place], [place] [address]    Urine culture     Status: Abnormal   Collection Time: 04/11/20 12:37 AM   Specimen: Urine, Random  Result Value Ref Range Status   Specimen Description URINE, RANDOM  Final   Special Requests NONE  Final   Culture (A)  Final    <10,000 COLONIES/mL INSIGNIFICANT GROWTH Performed at [facility], [address], [place], [place] [address]    Report Status 04/12/2020 FINAL  Final         Radiology Studies: DG Chest 1 View  Result Date: 04/10/2020 CLINICAL DATA:  Cough EXAM: CHEST  1 VIEW COMPARISON:  02/14/2020 FINDINGS: Shallow inspiration with infiltration or atelectasis in the lung bases. No pleural effusions. No pneumothorax. Degenerative changes in the spine. IMPRESSION: Shallow inspiration with infiltration or atelectasis in the lung bases. Electronically Signed   By: Burman Nieves M.D.   On: 04/10/2020 20:29   CT ABDOMEN PELVIS W CONTRAST  Result Date: 04/10/2020 CLINICAL DATA:  Abdominal pain, fever, nausea, vomiting EXAM: CT ABDOMEN AND PELVIS WITH CONTRAST TECHNIQUE: Multidetector CT imaging of the abdomen and pelvis was performed using the standard protocol following bolus administration of intravenous contrast. CONTRAST:  OMNIPAQUE IOHEXOL 300 MG/ML  SOLN COMPARISON:  None. FINDINGS: Lower chest: Bibasilar atelectasis. The visualized heart and pericardium are unremarkable. Hepatobiliary: Moderate hepatic steatosis. Mild hepatomegaly. No focal enhancing liver lesion. No intra or extrahepatic biliary ductal dilation. Gallbladder unremarkable. Pancreas: Unremarkable Spleen: Unremarkable Adrenals/Urinary Tract: Adrenal glands are unremarkable. Kidneys are normal, without renal calculi, focal lesion, or hydronephrosis. Bladder is unremarkable. Stomach/Bowel: Stomach is within normal limits. Appendix appears normal. No evidence of bowel wall thickening, distention, or inflammatory changes. No free intraperitoneal gas or fluid. Vascular/Lymphatic: No significant vascular  findings are present. No enlarged abdominal or pelvic lymph nodes. Reproductive: Status post hysterectomy. No adnexal masses. Other: Rectum unremarkable.  No abdominal wall hernia. Musculoskeletal: No acute bone abnormality. No lytic or blastic bone lesion. IMPRESSION: No acute intra-abdominal pathology identified. No radiographic explanation for the patient's reported symptoms. Moderate hepatic steatosis. Electronically Signed   By: Helyn Numbers MD   On: 04/10/2020 23:52        Scheduled Meds: . Melene Muller ON 04/13/2020] azithromycin  500 mg Oral Daily  . enoxaparin (LOVENOX) injection  40 mg Subcutaneous Daily  . ferrous sulfate  325 mg Oral Daily  . furosemide  20 mg Oral BID  . guaiFENesin  600 mg Oral BID  . insulin aspart  0-15 Units Subcutaneous TID  WC  . levothyroxine  224 mcg Oral Q0600  . oxyCODONE  5 mg Oral Once  . oxyCODONE-acetaminophen  1 tablet Oral Once  . umeclidinium-vilanterol  1 puff Inhalation Daily   Continuous Infusions: . cefTRIAXone (ROCEPHIN)  IV 2 g (04/12/20 0500)     LOS: 1 day    Time spent: 35 minutes    Alisen Marsiglia A July Nickson, MD [facility]   If 7PM-7AM, please contact night-coverage [email or web address]  04/12/2020, 1:51 PM

## 2020-04-13 DIAGNOSIS — J189 Pneumonia, unspecified organism: Principal | ICD-10-CM

## 2020-04-13 LAB — GLUCOSE, CAPILLARY
Glucose-Capillary: 138 mg/dL — ABNORMAL HIGH (ref 70–99)
Glucose-Capillary: 123 mg/dL — ABNORMAL HIGH (ref 70–99)

## 2020-04-13 LAB — BASIC METABOLIC PANEL
Anion gap: 11 (ref 5–15)
BUN: 8 mg/dL (ref 6–20)
CO2: 35 mmol/L — ABNORMAL HIGH (ref 22–32)
Calcium: 8.6 mg/dL — ABNORMAL LOW (ref 8.9–10.3)
Chloride: 94 mmol/L — ABNORMAL LOW (ref 98–111)
Creatinine, Ser: 0.68 mg/dL (ref 0.44–1.00)
GFR, Estimated: >60 mL/min (ref >60)
Glucose, Bld: 108 mg/dL — ABNORMAL HIGH (ref 70–99)
Potassium: 3.8 mmol/L (ref 3.5–5.1)
Sodium: 140 mmol/L (ref 135–145)

## 2020-04-13 LAB — PROCALCITONIN: Procalcitonin: <0.1 ng/mL

## 2020-04-13 MED ORDER — BACITRACIN-NEOMYCIN-POLYMYXIN OINTMENT TUBE
TOPICAL_OINTMENT | Freq: Every day | CUTANEOUS | Status: DC
  Filled 2020-04-13: qty 14

## 2020-04-13 MED ORDER — NYSTATIN 100000 UNIT/GM EX CREA
TOPICAL_CREAM | Freq: Two times a day (BID) | CUTANEOUS | Status: DC
  Filled 2020-04-13: qty 15

## 2020-04-13 MED ORDER — CEPHALEXIN 500 MG PO CAPS: 500.0000 mg | ORAL_CAPSULE | Freq: Two times a day (BID) | ORAL | 0 refills | Status: AC

## 2020-04-13 MED ORDER — GUAIFENESIN ER 600 MG PO TB12: 1200.0000 mg | ORAL_TABLET | Freq: Two times a day (BID) | ORAL | 0 refills | Status: DC

## 2020-04-13 MED ORDER — CEPHALEXIN 500 MG PO CAPS: 500.0000 mg | ORAL_CAPSULE | Freq: Four times a day (QID) | ORAL | 0 refills | Status: DC

## 2020-04-13 MED ORDER — NYSTATIN 100000 UNIT/GM EX CREA: 1.0000 "application " | TOPICAL_CREAM | Freq: Two times a day (BID) | CUTANEOUS | 1 refills | Status: DC | PRN

## 2020-04-13 MED ORDER — AZITHROMYCIN 500 MG PO TABS: 500.0000 mg | ORAL_TABLET | Freq: Every day | ORAL | 0 refills | Status: AC

## 2020-04-13 MED ORDER — DICLOFENAC SODIUM 1 % TD GEL
2.0000 g | Freq: Four times a day (QID) | TRANSDERMAL | Status: DC | PRN
  Administered 2020-04-13: 2 g via TOPICAL
  Filled 2020-04-13: qty 100

## 2020-04-13 MED ORDER — BENZONATATE 200 MG PO CAPS: 200.0000 mg | ORAL_CAPSULE | Freq: Three times a day (TID) | ORAL | 0 refills | Status: DC

## 2020-04-13 NOTE — Plan of Care (Signed)
  Problem: Education: Goal: Knowledge of General Education information will improve Description: Including pain rating scale, medication(s)/side effects and non-pharmacologic comfort measures Outcome: Progressing   Problem: Pain Managment: Goal: General experience of comfort will improve Outcome: Progressing   

## 2020-04-13 NOTE — Discharge Summary (Signed)
Physician Discharge Summary  Michele Owens JTT:[id number] DOB: 01/19/1961 DOA: 04/10/2020  PCP: Michele Coombe, MD  Admit date: 04/10/2020 Discharge date: 04/13/2020  Admitted From: Home.  Disposition:  Home   Recommendations for Outpatient Follow-up:  1. Follow up with PCP in 1-2 weeks 2. Please obtain BMP/CBC in one week 3. Needs follow up for leukocytosis if persist she might need Hematology evaluation.     Discharge Condition: Stable.  CODE STATUS: Full Code Diet recommendation: Heart Healthy   Brief/Interim Summary: 60 year old with past medical history significant for nocturnal hypoxia who presented to the hospital complaining of left knee pain and abdominal pain was found to have leukocytosis. She presented complaining of nausea, abdominal pain, knee pain.  She also report cough. She is on chronic 3 L of oxygen at home due to hypoxemia. Evaluation in the ED CT abdomen and pelvis was negative for acute infectious process or acute abnormality, white blood cell 32.  Chest x-ray showed atelectasis versus infiltration the lung bases. Patient admitted with leukocytosis, she received a dose of IV antibiotics.   1-Community-Acquired Pneumonia: Patient presented with leukocytosis, cough, chest x-ray with infiltrates versus atelectasis at the bases. -She received a dose of IV ceftriaxone and azithromycin. -Her white blood cell has decreased to  15 -Continue with IV antibiotics, ceftriaxone and azithromycin for 5 days.  -WBC trending down. Discharge on Zithromax and Keflex for 3 days to complete 5 days treatment.   2-leukocytosis: Likely secondary to pneumonia.  Knee does not appear to be infected. Resolving.   3-Knee pain; has improved.  4-Chronic pain: Continue with home regimen  5-Hypothyroidism: She had a transient hypotension in the ED improved with fluids  History of thyroid cancer status post thyroidectomy: Continue with Synthroid.  COPD: No exacerbation  monitor Continue with Anoro  Chronic diastolic HF; resume torsemide. Compensated.  Folliculitis;  Warm compress, neosporin.  Cutaneous candidiasis; under breast; started nystatin.   Discharge Diagnoses:  Principal Problem:   CAP (community acquired pneumonia) Active Problems:   Dyslipidemia associated with type 2 diabetes mellitus (HCC)   Leukocytosis   Benign essential HTN   Morbid obesity with BMI of 50.0-59.9, adult [facility])    Discharge Instructions  Discharge Instructions    Diet - low sodium heart healthy   Complete by: As directed    Increase activity slowly   Complete by: As directed      Allergies as of 04/13/2020      Reactions   Bee Venom Swelling, Other (See Comments)   "All over my body" (swelling)   Fioricet [butalbital-apap-caffeine] Nausea And Vomiting, Rash   Ibuprofen Rash, Other (See Comments)   Severe rash   Lamisil [terbinafine] Rash, Other (See Comments)   Pt states this causes her to "feel funny"   Nsaids Other (See Comments)   Per MD's orders       Medication List    STOP taking these medications   dicyclomine 20 MG tablet Commonly known as: BENTYL   hydrOXYzine 25 MG tablet Commonly known as: ATARAX/VISTARIL   lidocaine 5 % Commonly known as: LIDODERM   metoCLOPramide 10 MG tablet Commonly known as: REGLAN   montelukast 10 MG tablet Commonly known as: SINGULAIR   predniSONE 20 MG tablet Commonly known as: DELTASONE     TAKE these medications   Accu-Chek Aviva Plus w/Device Kit 1 each by Other route daily.   albuterol 108 (90 Base) MCG/ACT inhaler Commonly known as: VENTOLIN HFA Inhale 1-2 puffs into the lungs every 6 (six)  hours as needed for wheezing or shortness of breath.   allopurinol 100 MG tablet Commonly known as: ZYLOPRIM Take 100 mg by mouth daily.   Anoro Ellipta 62.5-25 MCG/INH Aepb Generic drug: umeclidinium-vilanterol Inhale 1 puff into the lungs daily.   azithromycin 500 MG tablet Commonly known as:  ZITHROMAX Take 1 tablet (500 mg total) by mouth daily for 3 days. What changed: additional instructions   benzonatate 200 MG capsule Commonly known as: TESSALON Take 1 capsule (200 mg total) by mouth in the morning, at noon, and at bedtime. 7 DS   calcitRIOL 0.5 MCG capsule Commonly known as: ROCALTROL Take 1 capsule (0.5 mcg total) by mouth daily.   carvedilol 6.25 MG tablet Commonly known as: COREG Take 1 tablet (6.25 mg total) by mouth 2 (two) times daily with a meal.   cephALEXin 500 MG capsule Commonly known as: KEFLEX Take 1 capsule (500 mg total) by mouth 2 (two) times daily for 3 days.   clonazePAM 2 MG tablet Commonly known as: KLONOPIN Take 1 tablet (2 mg total) by mouth 3 (three) times daily. for anxiety   cyclobenzaprine 10 MG tablet Commonly known as: FLEXERIL Take 10 mg by mouth 3 (three) times daily.   diclofenac sodium 1 % Gel Commonly known as: VOLTAREN Apply 2 g topically 4 (four) times daily as needed (for pain).   EPINEPHrine 0.3 mg/0.3 mL Soaj injection Commonly known as: EPI-PEN Inject 0.3 mg into the muscle daily as needed (allergic reaction).   FeroSul 325 (65 FE) MG tablet Generic drug: ferrous sulfate Take 325 mg by mouth daily.   glucose blood test strip Use as instructed to check blood sugar up to 4 times daily What changed:   how much to take  how to take this  when to take this   guaiFENesin 600 MG 12 hr tablet Commonly known as: MUCINEX Take 2 tablets (1,200 mg total) by mouth 2 (two) times daily.   ipratropium-albuterol 0.5-2.5 (3) MG/3ML Soln Commonly known as: DUONEB Take 3 mLs by nebulization 4 (four) times daily.   levothyroxine 112 MCG tablet Commonly known as: SYNTHROID Take 224 mcg by mouth daily.   lisinopril 5 MG tablet Commonly known as: ZESTRIL Take 1 tablet (5 mg total) by mouth daily. Please call for office visit [phone]   loperamide 2 MG capsule Commonly known as: IMODIUM Take 1 capsule (2 mg total)  by mouth 4 (four) times daily as needed for diarrhea or loose stools.   metFORMIN 500 MG tablet Commonly known as: Glucophage Take 1 tablet (500 mg total) by mouth 2 (two) times daily with a meal.   methocarbamol 500 MG tablet Commonly known as: ROBAXIN Take 1 tablet (500 mg total) by mouth every 8 (eight) hours as needed for muscle spasms.   nitroGLYCERIN 0.4 MG SL tablet Commonly known as: NITROSTAT Place 1 tablet (0.4 mg total) under the tongue every 5 (five) minutes as needed for chest pain.   nystatin cream Commonly known as: MYCOSTATIN Apply 1 application topically 2 (two) times daily as needed for dry skin.   omeprazole 20 MG capsule Commonly known as: PRILOSEC Take 20 mg by mouth 2 (two) times daily before a meal.   oxyCODONE-acetaminophen 10-325 MG tablet Commonly known as: PERCOCET Take 1 tablet by mouth every 6 (six) hours as needed for pain.   OXYGEN Inhale 2-3 L into the lungs continuous.   potassium chloride SA 20 MEQ tablet Commonly known as: KLOR-CON Take 1 tablet (20 mEq total) by  mouth 2 (two) times daily.   QC Stool Softener Pls Laxative 8.6-50 MG tablet Generic drug: senna-docusate Take 1 tablet by mouth at bedtime.   rosuvastatin 10 MG tablet Commonly known as: CRESTOR Take 10 mg by mouth every evening.   torsemide 20 MG tablet Commonly known as: DEMADEX TAKE 4 (FOUR) TABLETS BY MOUTH DAILY (2AM+ 2NOON) What changed: See the new instructions.   traZODone 50 MG tablet Commonly known as: DESYREL Take 100 mg by mouth at bedtime.       Follow-up Information    Michele Coombe, MD Follow up in 1 week(s).   Specialty: Internal Medicine Contact information: [address] [place] [address] [phone]              Allergies  Allergen Reactions  . Bee Venom Swelling and Other (See Comments)    "All over my body" (swelling)  . Fioricet [Butalbital-Apap-Caffeine] Nausea And Vomiting and Rash  . Ibuprofen Rash  and Other (See Comments)    Severe rash  . Lamisil [Terbinafine] Rash and Other (See Comments)    Pt states this causes her to "feel funny"  . Nsaids Other (See Comments)    Per MD's orders     Consultations:  None   Procedures/Studies: DG Chest 1 View  Result Date: 04/10/2020 CLINICAL DATA:  Cough EXAM: CHEST  1 VIEW COMPARISON:  02/14/2020 FINDINGS: Shallow inspiration with infiltration or atelectasis in the lung bases. No pleural effusions. No pneumothorax. Degenerative changes in the spine. IMPRESSION: Shallow inspiration with infiltration or atelectasis in the lung bases. Electronically Signed   By: Lucienne Capers M.D.   On: 04/10/2020 20:29   CT ABDOMEN PELVIS W CONTRAST  Result Date: 04/10/2020 CLINICAL DATA:  Abdominal pain, fever, nausea, vomiting EXAM: CT ABDOMEN AND PELVIS WITH CONTRAST TECHNIQUE: Multidetector CT imaging of the abdomen and pelvis was performed using the standard protocol following bolus administration of intravenous contrast. CONTRAST:  153m OMNIPAQUE IOHEXOL 300 MG/ML  SOLN COMPARISON:  None. FINDINGS: Lower chest: Bibasilar atelectasis. The visualized heart and pericardium are unremarkable. Hepatobiliary: Moderate hepatic steatosis. Mild hepatomegaly. No focal enhancing liver lesion. No intra or extrahepatic biliary ductal dilation. Gallbladder unremarkable. Pancreas: Unremarkable Spleen: Unremarkable Adrenals/Urinary Tract: Adrenal glands are unremarkable. Kidneys are normal, without renal calculi, focal lesion, or hydronephrosis. Bladder is unremarkable. Stomach/Bowel: Stomach is within normal limits. Appendix appears normal. No evidence of bowel wall thickening, distention, or inflammatory changes. No free intraperitoneal gas or fluid. Vascular/Lymphatic: No significant vascular findings are present. No enlarged abdominal or pelvic lymph nodes. Reproductive: Status post hysterectomy. No adnexal masses. Other: Rectum unremarkable.  No abdominal wall hernia.  Musculoskeletal: No acute bone abnormality. No lytic or blastic bone lesion. IMPRESSION: No acute intra-abdominal pathology identified. No radiographic explanation for the patient's reported symptoms. Moderate hepatic steatosis. Electronically Signed   By: AFidela SalisburyMD   On: 04/10/2020 23:52     Subjective: She is feeling better , cough improved.   Discharge Exam: Vitals:   04/13/20 0923 04/13/20 0952  BP:  (!) 150/109  Pulse:  89  Resp:    Temp:  98.8 F (37.1 C)  SpO2: 95% 95%     General: Pt is alert, awake, not in acute distress Cardiovascular: RRR, S1/S2 +, no rubs, no gallops Respiratory: CTA bilaterally, no wheezing, no rhonchi Abdominal: Soft, NT, ND, bowel sounds + Extremities: no edema, no cyanosis    The results of significant diagnostics from this hospitalization (including imaging, microbiology, ancillary and  laboratory) are listed below for reference.     Microbiology: Recent Results (from the past 240 hour(s))  Resp Panel by RT-PCR (Flu A&B, Covid) Nasopharyngeal Swab     Status: None   Collection Time: 04/10/20  6:55 PM   Specimen: Nasopharyngeal Swab; Nasopharyngeal(NP) swabs in vial transport medium  Result Value Ref Range Status   SARS Coronavirus 2 by RT PCR NEGATIVE NEGATIVE Final    Comment: (NOTE) SARS-CoV-2 target nucleic acids are NOT DETECTED.  The SARS-CoV-2 RNA is generally detectable in upper respiratory specimens during the acute phase of infection. The lowest concentration of SARS-CoV-2 viral copies this assay can detect is 138 copies/mL. A negative result does not preclude SARS-Cov-2 infection and should not be used as the sole basis for treatment or other patient management decisions. A negative result may occur with  improper specimen collection/handling, submission of specimen other than nasopharyngeal swab, presence of viral mutation(s) within the areas targeted by this assay, and inadequate number of viral copies(<138  copies/mL). A negative result must be combined with clinical observations, patient history, and epidemiological information. The expected result is Negative.  Fact Sheet for Patients:  EntrepreneurPulse.com.au  Fact Sheet for Healthcare Providers:  IncredibleEmployment.be  This test is no t yet approved or cleared by the [place] FDA and  has been authorized for detection and/or diagnosis of SARS-CoV-2 by FDA under an Emergency Use Authorization (EUA). This EUA will remain  in effect (meaning this test can be used) for the duration of the COVID-19 declaration under Section 564(b)(1) of the Act, 21 U.S.C.section 360bbb-3(b)(1), unless the authorization is terminated  or revoked sooner.       Influenza A by PCR NEGATIVE NEGATIVE Final   Influenza B by PCR NEGATIVE NEGATIVE Final    Comment: (NOTE) The Xpert Xpress SARS-CoV-2/FLU/RSV plus assay is intended as an aid in the diagnosis of influenza from Nasopharyngeal swab specimens and should not be used as a sole basis for treatment. Nasal washings and aspirates are unacceptable for Xpert Xpress SARS-CoV-2/FLU/RSV testing.  Fact Sheet for Patients: EntrepreneurPulse.com.au  Fact Sheet for Healthcare Providers: IncredibleEmployment.be  This test is not yet approved or cleared by the [place] FDA and has been authorized for detection and/or diagnosis of SARS-CoV-2 by FDA under an Emergency Use Authorization (EUA). This EUA will remain in effect (meaning this test can be used) for the duration of the COVID-19 declaration under Section 564(b)(1) of the Act, 21 U.S.C. section 360bbb-3(b)(1), unless the authorization is terminated or revoked.  Performed at [facility], [place] [address] [place], [place] [address]   Urine culture     Status: Abnormal   Collection Time: 04/11/20 12:37 AM   Specimen: Urine, Random  Result Value Ref Range Status    Specimen Description URINE, RANDOM  Final   Special Requests NONE  Final   Culture (A)  Final    <10,000 COLONIES/mL INSIGNIFICANT GROWTH Performed at [facility], [place] [address], [place], [place] [address]    Report Status 04/12/2020 FINAL  Final     Labs: BNP (last 3 results) Recent Labs    09/02/19 1149 09/30/19 1412 02/15/20 0827  BNP 59.7 39.1 16.6   Basic Metabolic Panel: Recent Labs  Lab 04/10/20 1858 04/10/20 1954 04/11/20 0750 04/12/20 0836 04/13/20 0303  NA 140 139 140 137 140  K 4.3 4.2 3.6 3.4* 3.8  CL 96*  --  98 95* 94*  CO2 31  --  30 29 35*  GLUCOSE  135*  --  104* 189* 108*  BUN 15  --  _0 CREATININE 0.83  --  0.62 0.66 0.68  CALCIUM 7.9*  --  7.5* 7.9* 8.6*   Liver Function Tests: Recent Labs  Lab 04/10/20 1858 04/11/20 0750  AST 17 13*  ALT 19 16  ALKPHOS 72 62  BILITOT 0.6 0.6  PROT 7.5 6.2*  ALBUMIN 3.7 3.1*   Recent Labs  Lab 04/10/20 1845  LIPASE 22   No results for input(s): AMMONIA in the last 168 hours. CBC: Recent Labs  Lab 04/10/20 1858 04/10/20 1954 04/11/20 0750 04/12/20 0836  WBC 32.2*  --  22.5* 15.9*  NEUTROABS 26.9*  --  18.1*  --   HGB 11.3* 12.2 10.1* 10.2*  HCT 37.8 36.0 34.6* 32.2*  MCV 95.5  --  97.5 94.7  PLT 299  --  259 264   Cardiac Enzymes: No results for input(s): CKTOTAL, CKMB, CKMBINDEX, TROPONINI in the last 168 hours. BNP: Invalid input(s): POCBNP CBG: Recent Labs  Lab 04/12/20 1154 04/12/20 1644 04/12/20 1958 04/13/20 0701 04/13/20 1136  GLUCAP 101* 137* 118* 138* 123*   D-Dimer No results for input(s): DDIMER in the last 72 hours. Hgb A1c Recent Labs    04/11/20 0750  HGBA1C 6.5*   Lipid Profile No results for input(s): CHOL, HDL, LDLCALC, TRIG, CHOLHDL, LDLDIRECT in the last 72 hours. Thyroid function studies No results for input(s): TSH, T4TOTAL, T3FREE, THYROIDAB in the last 72 hours.  Invalid input(s): FREET3 Anemia work up No results for input(s):  VITAMINB12, FOLATE, FERRITIN, TIBC, IRON, RETICCTPCT in the last 72 hours. Urinalysis    Component Value Date/Time   COLORURINE YELLOW 04/11/2020 0030   APPEARANCEUR CLEAR 04/11/2020 0030   LABSPEC 1.024 04/11/2020 0030   PHURINE 5.0 04/11/2020 0030   GLUCOSEU NEGATIVE 04/11/2020 0030   HGBUR NEGATIVE 04/11/2020 0030   BILIRUBINUR NEGATIVE 04/11/2020 0030   KETONESUR NEGATIVE 04/11/2020 0030   PROTEINUR NEGATIVE 04/11/2020 0030   UROBILINOGEN 0.2 02/28/2012 2004   NITRITE NEGATIVE 04/11/2020 0030   LEUKOCYTESUR NEGATIVE 04/11/2020 0030   Sepsis Labs Invalid input(s): PROCALCITONIN,  WBC,  LACTICIDVEN Microbiology Recent Results (from the past 240 hour(s))  Resp Panel by RT-PCR (Flu A&B, Covid) Nasopharyngeal Swab     Status: None   Collection Time: 04/10/20  6:55 PM   Specimen: Nasopharyngeal Swab; Nasopharyngeal(NP) swabs in vial transport medium  Result Value Ref Range Status   SARS Coronavirus 2 by RT PCR NEGATIVE NEGATIVE Final    Comment: (NOTE) SARS-CoV-2 target nucleic acids are NOT DETECTED.  The SARS-CoV-2 RNA is generally detectable in upper respiratory specimens during the acute phase of infection. The lowest concentration of SARS-CoV-2 viral copies this assay can detect is 138 copies/mL. A negative result does not preclude SARS-Cov-2 infection and should not be used as the sole basis for treatment or other patient management decisions. A negative result may occur with  improper specimen collection/handling, submission of specimen other than nasopharyngeal swab, presence of viral mutation(s) within the areas targeted by this assay, and inadequate number of viral copies(<138 copies/mL). A negative result must be combined with clinical observations, patient history, and epidemiological information. The expected result is Negative.  Fact Sheet for Patients:  EntrepreneurPulse.com.au  Fact Sheet for Healthcare Providers:   IncredibleEmployment.be  This test is no t yet approved or cleared by the [place] FDA and  has been authorized for detection and/or diagnosis of SARS-CoV-2 by FDA under an Emergency Use Authorization (EUA).  This EUA will remain  in effect (meaning this test can be used) for the duration of the COVID-19 declaration under Section 564(b)(1) of the Act, 21 U.S.C.section 360bbb-3(b)(1), unless the authorization is terminated  or revoked sooner.       Influenza A by PCR NEGATIVE NEGATIVE Final   Influenza B by PCR NEGATIVE NEGATIVE Final    Comment: (NOTE) The Xpert Xpress SARS-CoV-2/FLU/RSV plus assay is intended as an aid in the diagnosis of influenza from Nasopharyngeal swab specimens and should not be used as a sole basis for treatment. Nasal washings and aspirates are unacceptable for Xpert Xpress SARS-CoV-2/FLU/RSV testing.  Fact Sheet for Patients: EntrepreneurPulse.com.au  Fact Sheet for Healthcare Providers: IncredibleEmployment.be  This test is not yet approved or cleared by the [place] FDA and has been authorized for detection and/or diagnosis of SARS-CoV-2 by FDA under an Emergency Use Authorization (EUA). This EUA will remain in effect (meaning this test can be used) for the duration of the COVID-19 declaration under Section 564(b)(1) of the Act, 21 U.S.C. section 360bbb-3(b)(1), unless the authorization is terminated or revoked.  Performed at [facility], [place] [address], [place], [place] [address]   Urine culture     Status: Abnormal   Collection Time: 04/11/20 12:37 AM   Specimen: Urine, Random  Result Value Ref Range Status   Specimen Description URINE, RANDOM  Final   Special Requests NONE  Final   Culture (A)  Final    <10,000 COLONIES/mL INSIGNIFICANT GROWTH Performed at [facility], [place] [address], [place], [place] [address]    Report Status 04/12/2020 FINAL  Final      Time coordinating discharge: 40 minutes  SIGNED:   Elmarie Shiley, MD  [facility]

## 2020-04-13 NOTE — Progress Notes (Signed)
DISCHARGE NOTE HOME  [name] to be discharged Home per MD order. Discussed prescriptions and follow up appointments with the patient. Prescriptions given to patient; medication list explained in detail. Patient verbalized understanding.  Skin clean, dry and intact without evidence of skin break down, no evidence of skin tears noted. IV catheter discontinued intact. Site without signs and symptoms of complications. Dressing and pressure applied. Pt denies pain at the site currently. No complaints noted.  Patient free of lines, drains, and wounds.   An After Visit Summary (AVS) was printed and given to the patient. Patient escorted via wheelchair, and discharged home via private auto.  Pat Patrick, RN

## 2020-04-18 ENCOUNTER — Other Ambulatory Visit: Payer: Self-pay

## 2020-04-18 ENCOUNTER — Emergency Department (HOSPITAL_COMMUNITY): Payer: Medicaid Other

## 2020-04-18 ENCOUNTER — Inpatient Hospital Stay (HOSPITAL_COMMUNITY)
Admission: EM | Admit: 2020-04-18 | Discharge: 2020-04-20 | DRG: 871 | Disposition: A | Discharge status: Home / Self Care | Payer: Medicaid Other | Attending: Family Medicine | Admitting: Family Medicine

## 2020-04-18 ENCOUNTER — Inpatient Hospital Stay (HOSPITAL_COMMUNITY): Payer: Medicaid Other

## 2020-04-18 DIAGNOSIS — E785 Hyperlipidemia, unspecified: Secondary | ICD-10-CM | POA: Diagnosis not present

## 2020-04-18 DIAGNOSIS — E669 Obesity, unspecified: Secondary | ICD-10-CM | POA: Diagnosis present

## 2020-04-18 DIAGNOSIS — E662 Morbid (severe) obesity with alveolar hypoventilation: Secondary | ICD-10-CM | POA: Diagnosis present

## 2020-04-18 DIAGNOSIS — E872 Acidosis: Secondary | ICD-10-CM | POA: Diagnosis not present

## 2020-04-18 DIAGNOSIS — G4733 Obstructive sleep apnea (adult) (pediatric): Secondary | ICD-10-CM | POA: Diagnosis present

## 2020-04-18 DIAGNOSIS — J189 Pneumonia, unspecified organism: Secondary | ICD-10-CM | POA: Diagnosis present

## 2020-04-18 DIAGNOSIS — R0902 Hypoxemia: Secondary | ICD-10-CM | POA: Diagnosis not present

## 2020-04-18 DIAGNOSIS — R0603 Acute respiratory distress: Secondary | ICD-10-CM

## 2020-04-18 DIAGNOSIS — E119 Type 2 diabetes mellitus without complications: Secondary | ICD-10-CM | POA: Diagnosis not present

## 2020-04-18 DIAGNOSIS — E1169 Type 2 diabetes mellitus with other specified complication: Secondary | ICD-10-CM

## 2020-04-18 DIAGNOSIS — I272 Pulmonary hypertension, unspecified: Secondary | ICD-10-CM | POA: Diagnosis present

## 2020-04-18 DIAGNOSIS — K219 Gastro-esophageal reflux disease without esophagitis: Secondary | ICD-10-CM | POA: Diagnosis present

## 2020-04-18 DIAGNOSIS — E890 Postprocedural hypothyroidism: Secondary | ICD-10-CM | POA: Diagnosis present

## 2020-04-18 DIAGNOSIS — R042 Hemoptysis: Secondary | ICD-10-CM | POA: Diagnosis not present

## 2020-04-18 DIAGNOSIS — Z808 Family history of malignant neoplasm of other organs or systems: Secondary | ICD-10-CM

## 2020-04-18 DIAGNOSIS — G8929 Other chronic pain: Secondary | ICD-10-CM | POA: Diagnosis present

## 2020-04-18 DIAGNOSIS — Z7984 Long term (current) use of oral hypoglycemic drugs: Secondary | ICD-10-CM

## 2020-04-18 DIAGNOSIS — E66813 Obesity, class 3: Secondary | ICD-10-CM | POA: Diagnosis present

## 2020-04-18 DIAGNOSIS — I110 Hypertensive heart disease with heart failure: Secondary | ICD-10-CM | POA: Diagnosis present

## 2020-04-18 DIAGNOSIS — M109 Gout, unspecified: Secondary | ICD-10-CM | POA: Diagnosis present

## 2020-04-18 DIAGNOSIS — F418 Other specified anxiety disorders: Secondary | ICD-10-CM | POA: Diagnosis present

## 2020-04-18 DIAGNOSIS — I5032 Chronic diastolic (congestive) heart failure: Secondary | ICD-10-CM | POA: Diagnosis not present

## 2020-04-18 DIAGNOSIS — Z8249 Family history of ischemic heart disease and other diseases of the circulatory system: Secondary | ICD-10-CM

## 2020-04-18 DIAGNOSIS — Z79890 Hormone replacement therapy (postmenopausal): Secondary | ICD-10-CM | POA: Diagnosis not present

## 2020-04-18 DIAGNOSIS — Z86718 Personal history of other venous thrombosis and embolism: Secondary | ICD-10-CM

## 2020-04-18 DIAGNOSIS — Z6841 Body Mass Index (BMI) 40.0 and over, adult: Secondary | ICD-10-CM | POA: Diagnosis not present

## 2020-04-18 DIAGNOSIS — A419 Sepsis, unspecified organism: Principal | ICD-10-CM | POA: Diagnosis present

## 2020-04-18 DIAGNOSIS — G9341 Metabolic encephalopathy: Secondary | ICD-10-CM | POA: Diagnosis present

## 2020-04-18 DIAGNOSIS — Z20822 Contact with and (suspected) exposure to covid-19: Secondary | ICD-10-CM | POA: Diagnosis not present

## 2020-04-18 DIAGNOSIS — J9611 Chronic respiratory failure with hypoxia: Secondary | ICD-10-CM | POA: Diagnosis not present

## 2020-04-18 DIAGNOSIS — Z888 Allergy status to other drugs, medicaments and biological substances status: Secondary | ICD-10-CM | POA: Diagnosis not present

## 2020-04-18 DIAGNOSIS — Z720 Tobacco use: Secondary | ICD-10-CM | POA: Diagnosis present

## 2020-04-18 DIAGNOSIS — J9601 Acute respiratory failure with hypoxia: Secondary | ICD-10-CM

## 2020-04-18 DIAGNOSIS — J9612 Chronic respiratory failure with hypercapnia: Secondary | ICD-10-CM

## 2020-04-18 DIAGNOSIS — J9622 Acute and chronic respiratory failure with hypercapnia: Secondary | ICD-10-CM | POA: Diagnosis not present

## 2020-04-18 DIAGNOSIS — Z7951 Long term (current) use of inhaled steroids: Secondary | ICD-10-CM | POA: Diagnosis not present

## 2020-04-18 DIAGNOSIS — Z91013 Allergy to seafood: Secondary | ICD-10-CM

## 2020-04-18 DIAGNOSIS — Z79899 Other long term (current) drug therapy: Secondary | ICD-10-CM | POA: Diagnosis not present

## 2020-04-18 DIAGNOSIS — Z85850 Personal history of malignant neoplasm of thyroid: Secondary | ICD-10-CM

## 2020-04-18 DIAGNOSIS — Z87891 Personal history of nicotine dependence: Secondary | ICD-10-CM

## 2020-04-18 DIAGNOSIS — J440 Chronic obstructive pulmonary disease with acute lower respiratory infection: Secondary | ICD-10-CM | POA: Diagnosis present

## 2020-04-18 DIAGNOSIS — G9340 Encephalopathy, unspecified: Secondary | ICD-10-CM | POA: Diagnosis present

## 2020-04-18 DIAGNOSIS — R6520 Severe sepsis without septic shock: Secondary | ICD-10-CM | POA: Diagnosis present

## 2020-04-18 DIAGNOSIS — F419 Anxiety disorder, unspecified: Secondary | ICD-10-CM | POA: Diagnosis not present

## 2020-04-18 DIAGNOSIS — J9621 Acute and chronic respiratory failure with hypoxia: Secondary | ICD-10-CM | POA: Diagnosis present

## 2020-04-18 DIAGNOSIS — E860 Dehydration: Secondary | ICD-10-CM | POA: Diagnosis present

## 2020-04-18 DIAGNOSIS — R4182 Altered mental status, unspecified: Secondary | ICD-10-CM | POA: Diagnosis not present

## 2020-04-18 DIAGNOSIS — I10 Essential (primary) hypertension: Secondary | ICD-10-CM | POA: Diagnosis not present

## 2020-04-18 DIAGNOSIS — Z886 Allergy status to analgesic agent status: Secondary | ICD-10-CM | POA: Diagnosis not present

## 2020-04-18 DIAGNOSIS — F32A Depression, unspecified: Secondary | ICD-10-CM | POA: Diagnosis present

## 2020-04-18 LAB — I-STAT VENOUS BLOOD GAS, ED
Acid-Base Excess: 9 mmol/L — ABNORMAL HIGH (ref 0.0–2.0)
Bicarbonate: 37.1 mmol/L — ABNORMAL HIGH (ref 20.0–28.0)
Calcium, Ion: 1.15 mmol/L (ref 1.15–1.40)
HCT: 39 % (ref 36.0–46.0)
Hemoglobin: 13.3 g/dL (ref 12.0–15.0)
O2 Saturation: 85 %
Potassium: 3.7 mmol/L (ref 3.5–5.1)
Sodium: 140 mmol/L (ref 135–145)
TCO2: 39 mmol/L — ABNORMAL HIGH (ref 22–32)
pCO2, Ven: 65.1 mmHg — ABNORMAL HIGH (ref 44.0–60.0)
pH, Ven: 7.364 (ref 7.250–7.430)
pO2, Ven: 54 mmHg — ABNORMAL HIGH (ref 32.0–45.0)

## 2020-04-18 LAB — TROPONIN I (HIGH SENSITIVITY)
Troponin I (High Sensitivity): 4 ng/L (ref <18)
Troponin I (High Sensitivity): 4 ng/L (ref <18)

## 2020-04-18 LAB — SEDIMENTATION RATE: Sed Rate: 34 mm/hr — ABNORMAL HIGH (ref 0–22)

## 2020-04-18 LAB — BASIC METABOLIC PANEL
Anion gap: 12 (ref 5–15)
BUN: 13 mg/dL (ref 6–20)
CO2: 31 mmol/L (ref 22–32)
Calcium: 8.6 mg/dL — ABNORMAL LOW (ref 8.9–10.3)
Chloride: 96 mmol/L — ABNORMAL LOW (ref 98–111)
Creatinine, Ser: 0.84 mg/dL (ref 0.44–1.00)
GFR, Estimated: >60 mL/min (ref >60)
Glucose, Bld: 123 mg/dL — ABNORMAL HIGH (ref 70–99)
Potassium: 4 mmol/L (ref 3.5–5.1)
Sodium: 139 mmol/L (ref 135–145)

## 2020-04-18 LAB — CBC
HCT: 38.4 % (ref 36.0–46.0)
Hemoglobin: 11.6 g/dL — ABNORMAL LOW (ref 12.0–15.0)
MCH: 28.7 pg (ref 26.0–34.0)
MCHC: 30.2 g/dL (ref 30.0–36.0)
MCV: 95 fL (ref 80.0–100.0)
Platelets: 295 10*3/uL (ref 150–400)
RBC: 4.04 MIL/uL (ref 3.87–5.11)
RDW: 13.7 % (ref 11.5–15.5)
WBC: 22.2 10*3/uL — ABNORMAL HIGH (ref 4.0–10.5)
nRBC: 0 % (ref 0.0–0.2)

## 2020-04-18 LAB — BRAIN NATRIURETIC PEPTIDE: B Natriuretic Peptide: 16.6 pg/mL (ref 0.0–100.0)

## 2020-04-18 LAB — C-REACTIVE PROTEIN: CRP: 2.3 mg/dL — ABNORMAL HIGH (ref <1.0)

## 2020-04-18 LAB — PROTIME-INR
INR: 1 (ref 0.8–1.2)
Prothrombin Time: 13.2 seconds (ref 11.4–15.2)

## 2020-04-18 LAB — PROCALCITONIN: Procalcitonin: <0.1 ng/mL

## 2020-04-18 LAB — HIV ANTIBODY (ROUTINE TESTING W REFLEX): HIV Screen 4th Generation wRfx: NONREACTIVE

## 2020-04-18 LAB — RESP PANEL BY RT-PCR (FLU A&B, COVID) ARPGX2
Influenza A by PCR: NEGATIVE
Influenza B by PCR: NEGATIVE
SARS Coronavirus 2 by RT PCR: NEGATIVE

## 2020-04-18 LAB — LACTIC ACID, PLASMA
Lactic Acid, Venous: 1.9 mmol/L (ref 0.5–1.9)
Lactic Acid, Venous: 1.5 mmol/L (ref 0.5–1.9)

## 2020-04-18 LAB — MAGNESIUM: Magnesium: 2.2 mg/dL (ref 1.7–2.4)

## 2020-04-18 MED ORDER — DOXYCYCLINE HYCLATE 100 MG IV SOLR
100.0000 mg | Freq: Two times a day (BID) | INTRAVENOUS | Status: DC
  Administered 2020-04-19 – 2020-04-20 (×3): 100 mg via INTRAVENOUS
  Filled 2020-04-18 (×4): qty 100

## 2020-04-18 MED ORDER — VANCOMYCIN HCL 2000 MG/400ML IV SOLN
2000.0000 mg | Freq: Once | INTRAVENOUS | Status: DC
  Filled 2020-04-18: qty 400

## 2020-04-18 MED ORDER — NALOXONE HCL 0.4 MG/ML IJ SOLN: 0.4000 mg | INTRAMUSCULAR | Status: DC | PRN

## 2020-04-18 MED ORDER — VANCOMYCIN HCL IN DEXTROSE 1-5 GM/200ML-% IV SOLN: 1000.0000 mg | Freq: Two times a day (BID) | INTRAVENOUS | Status: DC

## 2020-04-18 MED ORDER — SODIUM CHLORIDE 0.9 % IV SOLN
2.0000 g | Freq: Three times a day (TID) | INTRAVENOUS | Status: DC
  Administered 2020-04-18 – 2020-04-20 (×5): 2 g via INTRAVENOUS
  Filled 2020-04-18 (×6): qty 2

## 2020-04-18 MED ORDER — IOHEXOL 350 MG/ML SOLN
75.0000 mL | Freq: Once | INTRAVENOUS | Status: AC | PRN
  Administered 2020-04-18: 75 mL via INTRAVENOUS

## 2020-04-18 MED ORDER — INSULIN ASPART 100 UNIT/ML ~~LOC~~ SOLN
0.0000 [IU] | SUBCUTANEOUS | Status: DC
  Administered 2020-04-19: 1 [IU] via SUBCUTANEOUS
  Administered 2020-04-19 (×2): 2 [IU] via SUBCUTANEOUS
  Administered 2020-04-19 – 2020-04-20 (×2): 1 [IU] via SUBCUTANEOUS

## 2020-04-18 NOTE — ED Notes (Signed)
pts bed adjusted the pt reports that she was told she had pneumonia  Her covid came back negative

## 2020-04-18 NOTE — ED Notes (Signed)
Pt will not talk to me she just keeps her eyes closed

## 2020-04-18 NOTE — ED Triage Notes (Signed)
Pt presents to ED BIB [facility]. Pt c/o hemoptysis. Pt reports that she was recently admitted for pneumonia. Pt denise any other s/s. Pt resp e/u, on home O2 at 3L.

## 2020-04-18 NOTE — ED Notes (Signed)
Pt taken to CT at this time.

## 2020-04-18 NOTE — Consult Note (Addendum)
NAME:  Michele Owens, MRN:  [id number], DOB:  [birth year], LOS: 0 ADMISSION DATE:  04/18/2020, CONSULTATION DATE:  04/18/20 REFERRING MD:  Roel Cluck, CHIEF COMPLAINT:  Short of breath  Brief History:  60 yo woman with hemoptysis at home, dyspnea, malaise fatigue.    History of Present Illness:  60 yo woman with c/o hemoptysis, chest tightness, dyspnea, malaise, chills and fatigue.  Increasing SOB x several days.     Recent admit, discharged 1/4, treated for CAP.   Started on Cefepime and vanc WBC up to 22.2 from 15.9 ABG consistent with her basline, except PaO2 is 54. 9pm.   Compensated resp acidosis.  BNP 16.6 trop 4 procal negative.  Blood cultures pending  Was feeling better but became more dyspneic over the past few days.   Coughing blood clots prior to admission to ED.   Asking for pain medication.    Past Medical History:  "nocturnal hypoxia" arthtritis Asthma/COPD Depression/Anxiety  DM GERD Gout DVT hx  HTN Thyroid cancer hx  Diastolic CHF  Home meds albuterol, anoro ellipta, allopurinol, benzonatate, coreg, calcitriol, clonazepam, cyclobenzaprine, levoxyl, lisinopril, loperamide, metformin, robaxin, omeprazole, percocet, 2LNC oxygen at baseline, crestor, torsemide, trazodone.  Significant Hospital Events:    Consults:    Procedures:    Significant Diagnostic Tests:  CT head  IMPRESSION: No acute intracranial abnormality.  CTA: IMPRESSION: 1. No central obstructing pulmonary embolism is identified. However, due to patient breathing motion artifact, small nonocclusive pulmonary emboli or peripheral pulmonary emboli cannot be confidently excluded. If symptoms persist or worsen, consider repeat chest CT angiogram if patient can perform adequate breath hold. 2. Patchy bibasilar consolidations, atelectasis versus pneumonia.  CXR IMPRESSION: Persistent mixed patchy and linear opacities in the lung bases, some of which is likely reflect a combination of  scarring and atelectasis though residual infection/airspace disease is not fully excluded.  Echo 5/21 EF 55-60, essentially nl  R Heart cath 6/21 1. Mildly elevated RA pressure and PCWP.  2. Mild pulmonary venous hypertension.  RA mean 11 RV 50/13 PA 44/14, mean 27 PCWP mean 15 Micro Data:  Blood cultures pending Urinalysis pending  Antimicrobials:  Cefepime vanc doxycycline  Interim History / Subjective:    Objective   Blood pressure 127/71, pulse 89, temperature 99.5 F (37.5 C), temperature source Oral, resp. rate 20, SpO2 (!) 79 %.       No intake or output data in the 24 hours ending 04/18/20 2240 There were no vitals filed for this visit.  Examination: General: NAD, sleepy, easily arousable HENT: NCAT Lungs: ctab Cardiovascular: rrr no mgr Abdomen: nt, nd, nbs Extremities: no pitting edema, no erythema, no tendernes Neuro: awake but drowsy, arousable, non focal exam   Resolved Hospital Problem list     Assessment & Plan:  Respiratory failure: chronic hypercarbic respiratory failure, hypoxemic respiratory failure.  Sautrating well on 4L nasal canula. Normally on 2L at home.   Stable.  No hemoptysis all day while her in ED. Here almost 24 hours.  Safe in my opinion for her usual CPAP (which she apparently uses at night normally).  Given possible obesity hypoventilation vs chronic hypercarbia, she may benefit from BIPAP at night in the long term.   CT c/w Pneumonia most likely.  Cont to monitor for hemoptysis.  Cefepime, vanc, adding doxy.   Urine antigens pending.  AMS: unclear cause.   Possibly 2/2 pain medication?  Utox pending.  Does not appear 2/2 hypercarbia, possibly close to baseline.   CT head negative.  Cont to monitor.  BP stable.  Best practice (evaluated daily)  Per hospitalist  Diet: regular Pain/Anxiety/Delirium protocol (if indicated):  VAP protocol (if indicated):  DVT prophylaxis:  GI prophylaxis:  Glucose control:   Mobility Disposition  Goals of Care:  Last date of multidisciplinary goals of care discussion:pending Family and staff present: pending Summary of discussion: pending  Follow up goals of care discussion due: pending  Code Status: Full Code   Labs   CBC: Recent Labs  Lab 04/12/20 0836 04/18/20 0112 04/18/20 2054  WBC 15.9* 22.2*  --   HGB 10.2* 11.6* 13.3  HCT 32.2* 38.4 39.0  MCV 94.7 95.0  --   PLT 264 295  --     Basic Metabolic Panel: Recent Labs  Lab 04/12/20 0836 04/13/20 0303 04/18/20 0112 04/18/20 1918 04/18/20 2054  NA 137 140 139  --  140  K 3.4* 3.8 4.0  --  3.7  CL 95* 94* 96*  --   --   CO2 29 35* 31  --   --   GLUCOSE 189* 108* 123*  --   --   BUN '6 8 13  ' --   --   CREATININE 0.66 0.68 0.84  --   --   CALCIUM 7.9* 8.6* 8.6*  --   --   MG  --   --   --  2.2  --    GFR: CrCl cannot be calculated (Unknown ideal weight.). Recent Labs  Lab 04/12/20 0245 04/12/20 0836 04/13/20 0303 04/18/20 0112 04/18/20 1303 04/18/20 1616 04/18/20 1918  PROCALCITON <0.10  --  <0.10  --   --   --  <0.10  WBC  --  15.9*  --  22.2*  --   --   --   LATICACIDVEN  --   --   --   --  1.9 1.5  --     Liver Function Tests: No results for input(s): AST, ALT, ALKPHOS, BILITOT, PROT, ALBUMIN in the last 168 hours. No results for input(s): LIPASE, AMYLASE in the last 168 hours. No results for input(s): AMMONIA in the last 168 hours.  ABG    Component Value Date/Time   PHART 7.307 (L) 08/04/2017 0616   PCO2ART 63.7 (H) 08/04/2017 0616   PO2ART 128 (H) 08/04/2017 0616   HCO3 37.1 (H) 04/18/2020 2054   TCO2 39 (H) 04/18/2020 2054   O2SAT 85.0 04/18/2020 2054     Coagulation Profile: Recent Labs  Lab 04/18/20 1918  INR 1.0    Cardiac Enzymes: No results for input(s): CKTOTAL, CKMB, CKMBINDEX, TROPONINI in the last 168 hours.  HbA1C: Hgb A1c MFr Bld  Date/Time Value Ref Range Status  04/11/2020 07:50 AM 6.5 (H) 4.8 - 5.6 % Final    Comment:     (NOTE) Pre diabetes:          5.7%-6.4%  Diabetes:              >6.4%  Glycemic control for   <7.0% adults with diabetes   07/09/2018 06:32 AM 6.6 (H) 4.8 - 5.6 % Final    Comment:    (NOTE) Pre diabetes:          5.7%-6.4% Diabetes:              >6.4% Glycemic control for   <7.0% adults with diabetes     CBG: Recent Labs  Lab 04/12/20 1154 04/12/20 1644 04/12/20 1958 04/13/20 0701 04/13/20 1136  GLUCAP 101* 137* 118* 138* 123*  Review of Systems:   Unable to assess  Past Medical History:  She,  has a past medical history of Anxiety, Arthritis, Asthma, Chronic diastolic CHF (congestive heart failure) ([place]), COPD (chronic obstructive pulmonary disease) ([place]), Depression, Diabetes mellitus without complication ([place]), Dyspnea, Family history of thyroid cancer, GERD (gastroesophageal reflux disease), Gout, Headache, History of DVT of lower extremity, History of nuclear stress test, Hypertension, Pneumonia, and Thyroid cancer ([place]) (09/23/2016).   Surgical History:   Past Surgical History:  Procedure Laterality Date  . BUNIONECTOMY Bilateral   . CARDIAC CATHETERIZATION N/A 06/23/2015   Procedure: Right/Left Heart Cath and Coronary Angiography;  Surgeon: [name], MD;  Location: [facility];  Service: Cardiovascular;  Laterality: N/A;  . COLONOSCOPY WITH PROPOFOL N/A 12/15/2015   Procedure: COLONOSCOPY WITH PROPOFOL;  Surgeon: [name], MD;  Location: [facility];  Service: Endoscopy;  Laterality: N/A;  . RIGHT HEART CATH N/A 10/01/2019   Procedure: RIGHT HEART CATH;  Surgeon: [name], MD;  Location: [facility];  Service: Cardiovascular;  Laterality: N/A;  . THYROIDECTOMY  09/19/2016  . THYROIDECTOMY N/A 09/19/2016   Procedure: TOTAL THYROIDECTOMY;  Surgeon: [name], MD;  Location: [facility];  Service: General;  Laterality: N/A;  . TONSILLECTOMY    . TOTAL ABDOMINAL HYSTERECTOMY  07/14/10     Social History:   reports that she quit smoking  about 3 years ago. Her smoking use included cigarettes. She has a 20.50 pack-year smoking history. She has never used smokeless tobacco. She reports previous alcohol use. She reports that she does not use drugs.   Family History:  Her family history includes Brain cancer in her paternal aunt; Cancer in her cousin, cousin, father, and maternal uncle; Diabetes in her mother; Thyroid cancer in her mother.   Allergies Allergies  Allergen Reactions  . Bee Venom Swelling and Other (See Comments)    "All over my body" (swelling)  . Fioricet [Butalbital-Apap-Caffeine] Nausea And Vomiting and Rash  . Ibuprofen Rash and Other (See Comments)    Severe rash  . Lamisil [Terbinafine] Rash and Other (See Comments)    Pt states this causes her to "feel funny"  . Nsaids Other (See Comments)    Per MD's orders      Home Medications  Prior to Admission medications   Medication Sig Start Date End Date Taking? Authorizing Provider  albuterol (PROVENTIL HFA;VENTOLIN HFA) 108 (90 Base) MCG/ACT inhaler Inhale 1-2 puffs into the lungs every 6 (six) hours as needed for wheezing or shortness of breath. 04/23/18   Long, [name], MD  allopurinol (ZYLOPRIM) 100 MG tablet Take 100 mg by mouth daily.    [provider]  ANORO ELLIPTA 62.5-25 MCG/INH AEPB Inhale 1 puff into the lungs daily.  04/11/18   [provider]  benzonatate (TESSALON) 200 MG capsule Take 1 capsule (200 mg total) by mouth in the morning, at noon, and at bedtime. 7 DS 04/13/20   Regalado, Jerald Kief A, MD  Blood Glucose Monitoring Suppl (ACCU-CHEK AVIVA PLUS) w/Device KIT 1 each by Other route daily.  08/07/17   [provider]  calcitRIOL (ROCALTROL) 0.5 MCG capsule Take 1 capsule (0.5 mcg total) by mouth daily. 09/26/16   Hongalgi, [name], MD  carvedilol (COREG) 6.25 MG tablet Take 1 tablet (6.25 mg total) by mouth 2 (two) times daily with a meal. 01/23/18   Bensimhon, [name], MD  clonazePAM (KLONOPIN) 2 MG tablet Take 1  tablet (2 mg total) by mouth  3 (three) times daily. for anxiety 06/23/19   [name], MD  cyclobenzaprine (FLEXERIL) 10 MG tablet Take 10 mg by mouth 3 (three) times daily. 11/19/19   [provider]  diclofenac sodium (VOLTAREN) 1 % GEL Apply 2 g topically 4 (four) times daily as needed (for pain). 06/19/18   [name], MD  EPINEPHrine 0.3 mg/0.3 mL IJ SOAJ injection Inject 0.3 mg into the muscle daily as needed (allergic reaction).    [provider]  FEROSUL 325 (65 Fe) MG tablet Take 325 mg by mouth daily. 03/22/17   [provider]  glucose blood test strip Use as instructed to check blood sugar up to 4 times daily Patient taking differently: 1 each by Other route See admin instructions. Use as instructed to check blood sugar up to 4 times daily 08/06/17   [name], DO  guaiFENesin (MUCINEX) 600 MG 12 hr tablet Take 2 tablets (1,200 mg total) by mouth 2 (two) times daily. 04/13/20   Regalado, Belkys A, MD  ipratropium-albuterol (DUONEB) 0.5-2.5 (3) MG/3ML SOLN Take 3 mLs by nebulization 4 (four) times daily.  04/05/17   [provider]  levothyroxine (SYNTHROID, LEVOTHROID) 112 MCG tablet Take 224 mcg by mouth daily. 07/11/17   [provider]  lisinopril (PRINIVIL,ZESTRIL) 5 MG tablet Take 1 tablet (5 mg total) by mouth daily. Please call for office visit [phone] 08/15/17   [name], MD  loperamide (IMODIUM) 2 MG capsule Take 1 capsule (2 mg total) by mouth 4 (four) times daily as needed for diarrhea or loose stools. 05/29/73   [name], MD  metFORMIN (GLUCOPHAGE) 500 MG tablet Take 1 tablet (500 mg total) by mouth 2 (two) times daily with a meal. 12/11/14   [name], MD  methocarbamol (ROBAXIN) 500 MG tablet Take 1 tablet (500 mg total) by mouth every 8 (eight) hours as needed for muscle spasms. 06/19/18   [name], MD  nitroGLYCERIN (NITROSTAT) 0.4 MG SL tablet Place 1 tablet (0.4 mg total) under the tongue  every 5 (five) minutes as needed for chest pain. 06/19/18   [name], MD  nystatin cream (MYCOSTATIN) Apply 1 application topically 2 (two) times daily as needed for dry skin. 04/13/20   Regalado, Belkys A, MD  omeprazole (PRILOSEC) 20 MG capsule Take 20 mg by mouth 2 (two) times daily before a meal. 11/19/19   [provider]  oxyCODONE-acetaminophen (PERCOCET) 10-325 MG tablet Take 1 tablet by mouth every 6 (six) hours as needed for pain. 12/03/19   [provider]  OXYGEN Inhale 2-3 L into the lungs continuous.    [provider]  potassium chloride SA (K-DUR,KLOR-CON) 20 MEQ tablet Take 1 tablet (20 mEq total) by mouth 2 (two) times daily. 07/24/18   [name], MD  QC STOOL SOFTENER PLS LAXATIVE 8.6-50 MG tablet Take 1 tablet by mouth at bedtime. 04/11/17   [provider]  rosuvastatin (CRESTOR) 10 MG tablet Take 10 mg by mouth every evening. 11/19/19   [provider]  torsemide (DEMADEX) 20 MG tablet TAKE 4 (FOUR) TABLETS BY MOUTH DAILY (2AM+ 2NOON) Patient taking differently: Take 80 mg by mouth 2 (two) times daily. 11/19/19   [name], MD  traZODone (DESYREL) 50 MG tablet Take 100 mg by mouth at bedtime. 11/19/19   [provider]     Critical care time: 45 minutes

## 2020-04-18 NOTE — ED Provider Notes (Signed)
Care handoff received from Dr. Sherry Ruffing.  Patient has been previously admitted to hospitalist team for hypoxia due to recurrent pneumonia.  Covid test and influenza panel were negative.  Broad-spectrum antibiotics initiated.  At shift change hospitalist was concerned for patient having increased sleepiness.  A VBG and CT head were ordered.  VBG without emergent findings.  I have been asked to follow-up on the CT head.  Pending no emergent findings hospitalist will continue to admit. - CT head IMPRESSION:  No acute intracranial abnormality.   I reevaluated the patient she is sitting upright in bed eating some food no acute distress.  Speech clear, well-appearing vital signs stable.  Patient has been admitted to hospital service.  Note: Portions of this report may have been transcribed using voice recognition software. Every effort was made to ensure accuracy; however, inadvertent computerized transcription errors may still be present.   Michele Boston, PA-C 04/18/20 2244    Tegeler, Gwenyth Allegra, MD 04/19/20 [phone]

## 2020-04-18 NOTE — ED Provider Notes (Signed)
[facility] EMERGENCY DEPARTMENT Provider Note   CSN: [id number] Arrival date & time: 04/18/20  0050     History No chief complaint on file.   Michele Owens is a 60 y.o. female.  The history is provided by the patient and medical records. No language interpreter was used.  Shortness of Breath Severity:  Severe Onset quality:  Gradual Duration:  3 days Timing:  Constant Progression:  Partially resolved Chronicity:  Recurrent Context: URI   Relieved by:  Nothing Worsened by:  Deep breathing, coughing and exertion Ineffective treatments:  Oxygen Associated symptoms: chest pain, cough, diaphoresis, hemoptysis and sputum production   Associated symptoms: no abdominal pain, no fever, no headaches, no vomiting and no wheezing   Risk factors: hx of PE/DVT and obesity        Past Medical History:  Diagnosis Date  . Anxiety   . Arthritis   . Asthma   . Chronic diastolic CHF (congestive heart failure) ([place])   . COPD (chronic obstructive pulmonary disease) (HCC)    Uses Oxygen at night  . Depression   . Diabetes mellitus without complication ([place])   . Dyspnea    occ  . Family history of thyroid cancer   . GERD (gastroesophageal reflux disease)   . Gout   . Headache    migraines  . History of DVT of lower extremity   . History of nuclear stress test    Myoview 2/17:  Low risk stress nuclear study with a small, moderate intensity, partially reversible inferior lateral defect consistent with small prior infarct and minimal peri-infarct ischemia; EF 68 with normal wall motion  . Hypertension   . Pneumonia   . Thyroid cancer ([place]) 09/23/2016    Patient Active Problem List   Diagnosis Date Noted  . Morbid obesity with BMI of 50.0-59.9, adult ([place])   . Fall at home, initial encounter 06/23/2018  . HNP (herniated nucleus pulposus), thoracic 06/23/2018  . CHF (congestive heart failure) ([place]) 06/18/2018  . Hypertensive disorder 08/28/2017  . Obesity  08/28/2017  . COPD exacerbation ([place]) 08/04/2017  . Acute and chronic respiratory failure with hypercapnia ([place])   . Pulmonary HTN ([place])   . Genetic testing 02/27/2017  . Family history of thyroid cancer   . Hypomagnesemia 10/03/2016  . Atypical chest pain   . HLD (hyperlipidemia) 09/23/2016  . Gout 09/23/2016  . DVT (deep vein thrombosis) in pregnancy 09/23/2016  . Thyroid cancer ([place]) 09/23/2016  . Hypocalcemia 09/23/2016  . AKI (acute kidney injury) ([place]) 09/23/2016  . Acute pulmonary edema (HCC)   . Acute hypoxemic respiratory failure ([place])   . Ventilator dependent ([place])   . Chronic respiratory failure with hypoxia and hypercapnia ([place]) 07/16/2016  . CAP (community acquired pneumonia) 01/23/2016  . 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 Pulmonary hypertension ([place]) 01/23/2016  . Obesity hypoventilation syndrome ([place]) 08/26/2015  . Dyslipidemia associated with type 2 diabetes mellitus ([place]) 04/24/2015  . Leukocytosis 04/24/2015  . Controlled type 2 diabetes mellitus without complication, without long-term current use of insulin ([place]) 04/24/2015  . Anxiety 04/24/2015  . Benign essential HTN 04/24/2015  . Normocytic anemia 04/24/2015  . OSA (obstructive sleep apnea) 05/10/2012  . Tobacco abuse 07/04/2011    Past Surgical History:  Procedure Laterality Date  . BUNIONECTOMY Bilateral   . CARDIAC CATHETERIZATION N/A 06/23/2015   Procedure: Right/Left Heart Cath and Coronary Angiography;  Surgeon: [name], MD;  Location: [facility];  Service: Cardiovascular;  Laterality: N/A;  . COLONOSCOPY WITH PROPOFOL N/A 12/15/2015   Procedure: COLONOSCOPY WITH PROPOFOL;  Surgeon: [name], MD;  Location: [facility];  Service: Endoscopy;  Laterality: N/A;  . RIGHT HEART CATH N/A 10/01/2019   Procedure: RIGHT HEART CATH;  Surgeon: [name], MD;  Location: [facility];  Service: Cardiovascular;  Laterality: N/A;  . THYROIDECTOMY   09/19/2016  . THYROIDECTOMY N/A 09/19/2016   Procedure: TOTAL THYROIDECTOMY;  Surgeon: [name], MD;  Location: [facility];  Service: General;  Laterality: N/A;  . TONSILLECTOMY    . TOTAL ABDOMINAL HYSTERECTOMY  07/14/10     OB History   No obstetric history on file.     Family History  Problem Relation Age of Onset  . Cancer Father        thought to be due to exposure to concrete  . Diabetes Mother   . Thyroid cancer Mother        dx in her 67s-60s  . Cancer Maternal Uncle        2 uncles with cancer NOS  . Brain cancer Paternal Aunt   . Cancer Cousin        maternal first cousin - NOS  . Cancer Cousin        maternal first cousin - NOS    Social History   Tobacco Use  . Smoking status: Former Smoker    Packs/day: 0.50    Years: 41.00    Pack years: 20.50    Types: Cigarettes    Quit date: 07/2016    Years since quitting: 3.7  . Smokeless tobacco: Never Used  . Tobacco comment: Starting to Wean Off -- using Nicotine Patch  Vaping Use  . Vaping Use: Never used  Substance Use Topics  . Alcohol use: Not Currently    Alcohol/week: 0.0 standard drinks    Comment: ? none now  . Drug use: Never    Comment: none for long time    Home Medications Prior to Admission medications   Medication Sig Start Date End Date Taking? Authorizing Provider  albuterol (PROVENTIL HFA;VENTOLIN HFA) 108 (90 Base) MCG/ACT inhaler Inhale 1-2 puffs into the lungs every 6 (six) hours as needed for wheezing or shortness of breath. 04/23/18   Long, [name], MD  allopurinol (ZYLOPRIM) 100 MG tablet Take 100 mg by mouth daily.    [provider]  ANORO ELLIPTA 62.5-25 MCG/INH AEPB Inhale 1 puff into the lungs daily.  04/11/18   [provider]  benzonatate (TESSALON) 200 MG capsule Take 1 capsule (200 mg total) by mouth in the morning, at noon, and at bedtime. 7 DS 04/13/20   Regalado, Jerald Kief A, MD  Blood Glucose Monitoring Suppl (ACCU-CHEK AVIVA PLUS) w/Device KIT 1 each by Other  route daily.  08/07/17   [provider]  calcitRIOL (ROCALTROL) 0.5 MCG capsule Take 1 capsule (0.5 mcg total) by mouth daily. 09/26/16   Hongalgi, [name], MD  carvedilol (COREG) 6.25 MG tablet Take 1 tablet (6.25 mg total) by mouth 2 (two) times daily with a meal. 01/23/18   Bensimhon, [name], MD  clonazePAM (KLONOPIN) 2 MG tablet Take 1 tablet (2 mg total) by mouth 3 (three) times daily. for anxiety 06/23/19   [name], MD  cyclobenzaprine (FLEXERIL) 10 MG tablet Take 10 mg by mouth 3 (three) times daily. 11/19/19   [provider]  diclofenac sodium (VOLTAREN) 1 % GEL Apply 2 g topically 4 (four) times daily as needed (  for pain). 06/19/18   [name], MD  EPINEPHrine 0.3 mg/0.3 mL IJ SOAJ injection Inject 0.3 mg into the muscle daily as needed (allergic reaction).    [provider]  FEROSUL 325 (65 Fe) MG tablet Take 325 mg by mouth daily. 03/22/17   [provider]  glucose blood test strip Use as instructed to check blood sugar up to 4 times daily Patient taking differently: 1 each by Other route See admin instructions. Use as instructed to check blood sugar up to 4 times daily 08/06/17   [name], DO  guaiFENesin (MUCINEX) 600 MG 12 hr tablet Take 2 tablets (1,200 mg total) by mouth 2 (two) times daily. 04/13/20   Regalado, Belkys A, MD  ipratropium-albuterol (DUONEB) 0.5-2.5 (3) MG/3ML SOLN Take 3 mLs by nebulization 4 (four) times daily.  04/05/17   [provider]  levothyroxine (SYNTHROID, LEVOTHROID) 112 MCG tablet Take 224 mcg by mouth daily. 07/11/17   [provider]  lisinopril (PRINIVIL,ZESTRIL) 5 MG tablet Take 1 tablet (5 mg total) by mouth daily. Please call for office visit [phone] 08/15/17   [name], MD  loperamide (IMODIUM) 2 MG capsule Take 1 capsule (2 mg total) by mouth 4 (four) times daily as needed for diarrhea or loose stools. 6/78/93   [name], MD  metFORMIN (GLUCOPHAGE) 500 MG tablet  Take 1 tablet (500 mg total) by mouth 2 (two) times daily with a meal. 12/11/14   [name], MD  methocarbamol (ROBAXIN) 500 MG tablet Take 1 tablet (500 mg total) by mouth every 8 (eight) hours as needed for muscle spasms. 06/19/18   [name], MD  nitroGLYCERIN (NITROSTAT) 0.4 MG SL tablet Place 1 tablet (0.4 mg total) under the tongue every 5 (five) minutes as needed for chest pain. 06/19/18   [name], MD  nystatin cream (MYCOSTATIN) Apply 1 application topically 2 (two) times daily as needed for dry skin. 04/13/20   Regalado, Belkys A, MD  omeprazole (PRILOSEC) 20 MG capsule Take 20 mg by mouth 2 (two) times daily before a meal. 11/19/19   [provider]  oxyCODONE-acetaminophen (PERCOCET) 10-325 MG tablet Take 1 tablet by mouth every 6 (six) hours as needed for pain. 12/03/19   [provider]  OXYGEN Inhale 2-3 L into the lungs continuous.    [provider]  potassium chloride SA (K-DUR,KLOR-CON) 20 MEQ tablet Take 1 tablet (20 mEq total) by mouth 2 (two) times daily. 07/24/18   [name], MD  QC STOOL SOFTENER PLS LAXATIVE 8.6-50 MG tablet Take 1 tablet by mouth at bedtime. 04/11/17   [provider]  rosuvastatin (CRESTOR) 10 MG tablet Take 10 mg by mouth every evening. 11/19/19   [provider]  torsemide (DEMADEX) 20 MG tablet TAKE 4 (FOUR) TABLETS BY MOUTH DAILY (2AM+ 2NOON) Patient taking differently: Take 80 mg by mouth 2 (two) times daily. 11/19/19   [name], MD  traZODone (DESYREL) 50 MG tablet Take 100 mg by mouth at bedtime. 11/19/19   [provider]    Allergies    Bee venom, Fioricet [butalbital-apap-caffeine], Ibuprofen, Lamisil [terbinafine], and Nsaids  Review of Systems   Review of Systems  Constitutional: Positive for chills, diaphoresis and fatigue. Negative for fever.  HENT: Positive for congestion.   Eyes: Negative for visual disturbance.  Respiratory: Positive for cough,  hemoptysis, sputum production, chest tightness and shortness of breath. Negative for wheezing and stridor.   Cardiovascular: Positive for  chest pain. Negative for palpitations and leg swelling.  Gastrointestinal: Negative for abdominal pain, constipation, diarrhea, nausea and vomiting.  Genitourinary: Negative for dysuria.  Musculoskeletal: Negative for back pain.  Neurological: Positive for light-headedness. Negative for dizziness, weakness and headaches.  Psychiatric/Behavioral: Negative for agitation.  All other systems reviewed and are negative.   Physical Exam Updated Vital Signs BP 115/68   Pulse 74   Temp 98.8 F (37.1 C) (Oral)   Resp 20   SpO2 100%   Physical Exam Vitals and nursing note reviewed.  Constitutional:      General: She is not in acute distress.    Appearance: She is well-developed and well-nourished. She is obese. She is ill-appearing. She is not toxic-appearing or diaphoretic.  HENT:     Head: Normocephalic and atraumatic.     Nose: No rhinorrhea.     Mouth/Throat:     Mouth: Mucous membranes are moist.     Pharynx: No oropharyngeal exudate or posterior oropharyngeal erythema.  Eyes:     Extraocular Movements: Extraocular movements intact.     Conjunctiva/sclera: Conjunctivae normal.     Pupils: Pupils are equal, round, and reactive to light.  Cardiovascular:     Rate and Rhythm: Normal rate and regular rhythm.     Pulses: Normal pulses.     Heart sounds: No murmur heard.   Pulmonary:     Effort: Pulmonary effort is normal. No respiratory distress.     Breath sounds: Rhonchi present. No wheezing or rales.  Chest:     Chest wall: No tenderness.  Abdominal:     General: Abdomen is flat.     Palpations: Abdomen is soft.     Tenderness: There is no abdominal tenderness. There is no right CVA tenderness, left CVA tenderness, guarding or rebound.  Musculoskeletal:        General: No tenderness.     Cervical back: Neck supple. No tenderness.      Right lower leg: Edema present.     Left lower leg: Edema present.  Skin:    General: Skin is warm and dry.     Capillary Refill: Capillary refill takes less than 2 seconds.     Findings: No erythema.  Neurological:     General: No focal deficit present.     Mental Status: She is alert.  Psychiatric:        Mood and Affect: Mood and affect and mood normal.     ED Results / Procedures / Treatments   Labs (all labs ordered are listed, but only abnormal results are displayed) Labs Reviewed  CBC - Abnormal; Notable for the following components:      Result Value   WBC 22.2 (*)    Hemoglobin 11.6 (*)    All other components within normal limits  BASIC METABOLIC PANEL - Abnormal; Notable for the following components:   Chloride 96 (*)    Glucose, Bld 123 (*)    Calcium 8.6 (*)    All other components within normal limits  I-STAT VENOUS BLOOD GAS, ED - Abnormal; Notable for the following components:   pCO2, Ven 65.1 (*)    pO2, Ven 54.0 (*)    Bicarbonate 37.1 (*)    TCO2 39 (*)    Acid-Base Excess 9.0 (*)    All other components within normal limits  RESP PANEL BY RT-PCR (FLU A&B, COVID) ARPGX2  CULTURE, BLOOD (ROUTINE X 2)  CULTURE, BLOOD (ROUTINE X 2)  EXPECTORATED SPUTUM ASSESSMENT W REFEX TO  RESP CULTURE  MRSA PCR SCREENING  BRAIN NATRIURETIC PEPTIDE  LACTIC ACID, PLASMA  LACTIC ACID, PLASMA  HEMOGLOBIN A1C  HIV ANTIBODY (ROUTINE TESTING W REFLEX)  PROCALCITONIN  LEGIONELLA PNEUMOPHILA SEROGP 1 UR AG  STREP PNEUMONIAE URINARY ANTIGEN  PROCALCITONIN  PROTIME-INR  BLOOD GAS, VENOUS  MAGNESIUM  SEDIMENTATION RATE  C-REACTIVE PROTEIN  TROPONIN I (HIGH SENSITIVITY)  TROPONIN I (HIGH SENSITIVITY)    EKG EKG Interpretation  Date/Time:  Sunday April 18 2020 12:48:41 EST Ventricular Rate:  97 PR Interval:    QRS Duration: 85 QT Interval:  368 QTC Calculation: 468 R Axis:   67 Text Interpretation: Sinus rhythm Low voltage, precordial leads Borderline T  abnormalities, anterior leads When compard to prior, similar apperance. No STEMI Confirmed by Antony Blackbird [phone]) on 04/18/2020 3:42:16 PM   Radiology DG Chest 1 View  Result Date: 04/18/2020 CLINICAL DATA:  Recent pneumonia EXAM: CHEST  1 VIEW COMPARISON:  Radiograph 04/10/2020 CT abdomen pelvis 04/10/2020 for comparison the lung bases, radiograph 02/15/2020 FINDINGS: Persistent mixed patchy and linear opacities in the lung bases, some of which is likely reflect a combination of scarring and atelectasis though residual infection/airspace disease is not fully excluded. No new consolidative opacities or convincing features of edema. No pneumothorax or visible effusion. Stable cardiomediastinal contours with a calcified aorta. No acute osseous or soft tissue abnormality. Degenerative changes are present in the imaged spine and shoulders. IMPRESSION: Persistent mixed patchy and linear opacities in the lung bases, some of which is likely reflect a combination of scarring and atelectasis though residual infection/airspace disease is not fully excluded. Electronically Signed   By: Lovena Le M.D.   On: 04/18/2020 01:29   CT Angio Chest PE W and/or Wo Contrast  Result Date: 04/18/2020 CLINICAL DATA:  Hemoptysis.  Recent admission for pneumonia. EXAM: CT ANGIOGRAPHY CHEST WITH CONTRAST TECHNIQUE: Multidetector CT imaging of the chest was performed using the standard protocol during bolus administration of intravenous contrast. Multiplanar CT image reconstructions and MIPs were obtained to evaluate the vascular anatomy. CONTRAST:  69m OMNIPAQUE IOHEXOL 350 MG/ML SOLN COMPARISON:  Chest CT angiogram dated 06/28/2019. FINDINGS: Cardiovascular: Majority of the peripheral segmental and subsegmental pulmonary branches to the lower lobes cannot be definitively characterized due to patient breathing motion artifact, however, there is no pulmonary embolism identified within the main, lobar or central segmental pulmonary  artery branches bilaterally. Cannot exclude small nonocclusive pulmonary emboli. No thoracic aortic aneurysm or evidence of aortic dissection. No pericardial effusion. Mild aortic atherosclerosis. Mediastinum/Nodes: No mass or enlarged lymph nodes are seen within the mediastinum or perihilar regions. Esophagus is unremarkable. Trachea appears normal. Lungs/Pleura: Patchy bibasilar consolidations, atelectasis versus pneumonia. Upper lungs are clear. No pleural effusion or pneumothorax. Upper Abdomen: Limited images of the upper abdomen are unremarkable. Musculoskeletal: Mild degenerative spondylosis of the slightly scoliotic thoracic spine. No acute appearing osseous abnormality. Review of the MIP images confirms the above findings. IMPRESSION: 1. No central obstructing pulmonary embolism is identified. However, due to patient breathing motion artifact, small nonocclusive pulmonary emboli or peripheral pulmonary emboli cannot be confidently excluded. If symptoms persist or worsen, consider repeat chest CT angiogram if patient can perform adequate breath hold. 2. Patchy bibasilar consolidations, atelectasis versus pneumonia. Aortic Atherosclerosis (ICD10-I70.0). Electronically Signed   By: SFranki CabotM.D.   On: 04/18/2020 08:56    Procedures Procedures (including critical care time)  CRITICAL CARE Performed by: CGwenyth AllegraTegeler Total critical care time: 35 minutes Critical care time was exclusive of separately billable  procedures and treating other patients. Critical care was necessary to treat or prevent imminent or life-threatening deterioration. Critical care was time spent personally by me on the following activities: development of treatment plan with patient and/or surrogate as well as nursing, discussions with consultants, evaluation of patient's response to treatment, examination of patient, obtaining history from patient or surrogate, ordering and performing treatments and interventions,  ordering and review of laboratory studies, ordering and review of radiographic studies, pulse oximetry and re-evaluation of patient's condition.   Medications Ordered in ED Medications  ceFEPIme (MAXIPIME) 2 g in sodium chloride 0.9 % 100 mL IVPB (2 g Intravenous New Bag/Given 04/18/20 1750)  insulin aspart (novoLOG) injection 0-9 Units (has no administration in time range)  vancomycin (VANCOREADY) IVPB 2000 mg/400 mL (has no administration in time range)  vancomycin (VANCOCIN) IVPB 1000 mg/200 mL premix (has no administration in time range)  iohexol (OMNIPAQUE) 350 MG/ML injection 75 mL (75 mLs Intravenous Contrast Given 04/18/20 0817)    ED Course  I have reviewed the triage vital signs and the nursing notes.  Pertinent labs & imaging results that were available during my care of the patient were reviewed by me and considered in my medical decision making (see chart for details).    MDM Rules/Calculators/A&P                          Michele Owens is a 60 y.o. female with a past medical history significant for hypertension, dyslipidemia, diabetes, pulmonary hypertension, oxygen dependence between 2 and 3 L at baseline, prior DVT not on anticoagulation, prior thyroid cancer, and recent admission for pneumonia discharged 5 days ago who presents with new hemoptysis, chest tightness, shortness of breath, malaise, chills, and fatigue.  Patient reports that when she was discharged 5 days ago, she was started to feel better.  She reports the last few days, she has developed worsened shortness of breath especially with deep breathing or any movement.  She reports she has been coughing up blood and large blood clots over the last 24 hours.  She reports worsening shortness of breath and fatigue and malaise.  She denies any nausea, vomiting, constipation, diarrhea, or urinary changes.  She reports chronic leg edema that is not seem to be different.  She reports chest pressure and chest tightness that comes  and goes especially with coughing.  She reports this is all worse than her admission.  On exam, lungs have coarseness bilaterally.  Chest was nontender and I could not reproduce her discomfort.  No murmur.  Abdomen is nontender.  Patient has edema in legs but did have intact pulses.  Patient was on 3 L nasal cannula while sitting but with conversation and when I sat her up to listen to her breath sounds, her oxygen saturations dropped into the low 70s at 71%.  Patient got diaphoretic, reported feeling more short of breath, chest tightness, and felt like she might pass out.  Her telemetry showed sinus tachycardia during this episode.  Patient had her oxygen increased to 6 L and laid back down which improved her symptoms.  It was a good waveform during this hypoxic episode.  Patient had work-up in triage including a chest x-ray and subsequent CT PE study.  Patient has been here for approximate 11-1/2 hours prior to my evaluation.  CT PE study does not show large or occlusive pulmonary embolism but due to motion artifact, cannot rule out very small nonocclusive clots.  There  was evidence of consolidation concerning for either atelectasis or pneumonia.  Given the patient's continued cough, recent pneumonia, worsening symptoms, and a leukocytosis that is worsening, I am concerned that she is having worsening pneumonia.  Due to the hypoxic episode and respiratory distress patient experience, I am concerned that she may not be safe for discharge home.  We will get other labs including a BNP given her history of heart failure, get a lactic acid, get blood cultures, and will get troponin and EKG.  Will touch base with pharmacy to discuss the best antibiotics for.  Anticipate admission for worsening pneumonia.  We will also make sure she does not now have COVID.  3:53 PM Spoke to pharmacy and they started more broad-spectrum antibiotics for the patient.  COVID and flu test are negative.  Temperature is slowly  rising.  Due to her hypoxia with me and needing increased amount of oxygen, will admit for recurrence of her worsening pneumonia.  Medicine team culture admission.  Medicine team requested a head CT as patient is still having some sleepiness.  Her VBG only showed CO2 of 65.  They will follow-up on the CT head.  Patient will still be admitted   Final Clinical Impression(s) / ED Diagnoses Final diagnoses:  Hypoxia  Respiratory distress  Recurrent pneumonia   Clinical Impression: 1. Hypoxia   2. Respiratory distress   3. Recurrent pneumonia     Disposition: Admit  This note was prepared with assistance of Dragon voice recognition software. Occasional wrong-word or sound-a-like substitutions may have occurred due to the inherent limitations of voice recognition software.     Niko Penson, Gwenyth Allegra, MD 04/18/20 2112

## 2020-04-18 NOTE — H&P (Signed)
Michele Owens QJJ:[id number] DOB: June 19, 1960 DOA: 04/18/2020     PCP: Michele Coombe, MD   Outpatient Specialists:    Pulmonary    Dr.SOOD  Patient arrived to ER on 04/18/20 at 0050 Referred by Attending Tegeler, Gwenyth Allegra, *  Patient coming from: home      Chief Complaint: Hemoptysis HPI: Michele Owens is a 60 y.o. female with medical history significant of OSA, Chronic diastolic HTN Chronic respiratory failure with hypoxia on 3 L of oxygen baseline, chronic pain, hypothyroidism, COPD,  gout  Presented with hemoptysis chest pain cough diaphoresis increased sputum production no associated fevers or chills No vomiting Patient recently was admitted today with elevated white blood cell count up to 32 possible community-acquired pneumonia chest x-ray showed atelectasis versus infiltrate She was treated with IV ceftriaxone and azithromycin and however cell count came down to 15 She finally was discharged on 4 January on Zithromax and Keflex to complete 5-day treatment She reports initially she was feeling better after her discharge above past few days her symptoms have came back normal hours.  She has been having production of large blood clots over past 24 hours when she coughs  Infectious risk factors:  Reports  shortness of breath, dry cough, abdominal pain,     Has   been vaccinated against COVID not boosted??   Initial COVID TEST  NEGATIVE   Lab Results  Component Value Date   SARSCOV2NAA NEGATIVE 04/18/2020   [place] NEGATIVE 04/10/2020    Regarding pertinent Chronic problems:     Hyperlipidemia -  on statins Crestor Lipid Panel     Component Value Date/Time   CHOL 226 (H) 08/05/2017 0318   TRIG 59 08/05/2017 0318   HDL 80 08/05/2017 0318   CHOLHDL 2.8 08/05/2017 0318   VLDL 12 08/05/2017 0318   LDLCALC 134 (H) 08/05/2017 0318     HTN on Coreg lisinopril   chronic CHF diastolic  - last echo may 2021 On Torsemide    DM 2 -  Lab Results   Component Value Date   HGBA1C 6.5 (H) 04/11/2020   on  PO meds only,    Hypothyroidism: History of thyroid cancer status post thyroidectomy Lab Results  Component Value Date   TSH 0.619 02/15/2020   on synthroid   Morbid obesity-   BMI Readings from Last 1 Encounters:  04/10/20 49.92 kg/m      COPD - not  followed by pulmonology  on baseline oxygen  3L,     OSA -on nocturnal oxygen,Posibly CPAP??     Chronic anemia - baseline hg Hemoglobin & Hematocrit  Recent Labs    04/11/20 0750 04/12/20 0836 04/18/20 0112  HGB 10.1* 10.2* 11.6*     While in ER: White blood cell count up to 22 Noted to be hypoxic while in emergency department down to 70s despite being on 3 L her oxygen was titrated up to 6 L CTA negative for PE but has motion artifact Worsening pneumonia noted Covid and flu negative  Started on broad-spectrum antibiotics per pharmacy cefepime and vancomycin VBG showing chronic hypercarbia    Hospitalist was called for admission for acute on chronic respiratory failure and pneumonia  sepsis  The following Work up has been ordered so far:  Orders Placed This Encounter  Procedures  . Blood culture (routine x 2)  . Resp Panel by RT-PCR (Flu A&B, Covid) Nasopharyngeal Swab  . DG Chest 1 View  . CT Angio Chest PE W and/or Wo  Contrast  . CBC  . Basic metabolic panel  . Brain natriuretic peptide  . Lactic acid, plasma  . Diet regular Room service appropriate? Yes; Fluid consistency: Thin  . Consult to hospitalist  ALL PATIENTS BEING ADMITTED/HAVING PROCEDURES NEED COVID-19 SCREENING  . ED EKG  . EKG 12-Lead    Following Medications were ordered in ER: Medications  ceFEPIme (MAXIPIME) 2 g in sodium chloride 0.9 % 100 mL IVPB (2 g Intravenous New Bag/Given 04/18/20 1750)  iohexol (OMNIPAQUE) 350 MG/ML injection 75 mL (75 mLs Intravenous Contrast Given 04/18/20 0817)        Consult Orders  (From admission, onward)         Start     Ordered   04/18/20  1556  Consult to hospitalist  ALL PATIENTS BEING ADMITTED/HAVING PROCEDURES NEED COVID-19 SCREENING  Once       Comments: ALL PATIENTS BEING ADMITTED/HAVING PROCEDURES NEED COVID-19 SCREENING  Provider:  (Not yet assigned)  Question Answer Comment  Place call to: [facility]   Reason for Consult Admit      04/18/20 1555        Significant initial  Findings: Abnormal Labs Reviewed  CBC - Abnormal; Notable for the following components:      Result Value   WBC 22.2 (*)    Hemoglobin 11.6 (*)    All other components within normal limits  BASIC METABOLIC PANEL - Abnormal; Notable for the following components:   Chloride 96 (*)    Glucose, Bld 123 (*)    Calcium 8.6 (*)    All other components within normal limits    Otherwise labs showing:  Recent Labs  Lab 04/12/20 0836 04/13/20 0303 04/18/20 0112  NA 137 140 139  K 3.4* 3.8 4.0  CO2 29 35* 31  GLUCOSE 189* 108* 123*  BUN '6 8 13  ' CREATININE 0.66 0.68 0.84  CALCIUM 7.9* 8.6* 8.6*    Cr  stable,  Lab Results  Component Value Date   CREATININE 0.84 04/18/2020   CREATININE 0.68 04/13/2020   CREATININE 0.66 04/12/2020    No results for input(s): AST, ALT, ALKPHOS, BILITOT, PROT, ALBUMIN in the last 168 hours. Lab Results  Component Value Date   CALCIUM 8.6 (L) 04/18/2020   PHOS 5.1 (H) 07/10/2018              WBC       Component Value Date/Time   WBC 22.2 (H) 04/18/2020 0112   LYMPHSABS 2.7 04/11/2020 0750   MONOABS 1.2 (H) 04/11/2020 0750   EOSABS 0.3 04/11/2020 0750   BASOSABS 0.1 04/11/2020 0750   Plt: Lab Results  Component Value Date   PLT 295 04/18/2020     Lactic Acid, Venous    Component Value Date/Time   LATICACIDVEN 1.5 04/18/2020 1616   Procalcitonin 0.1   COVID-19 Labs  Recent Labs    04/18/20 1918  CRP 2.3*    Lab Results  Component Value Date   SARSCOV2NAA NEGATIVE 04/18/2020   [place] NEGATIVE 04/10/2020    Venous  Blood Gas result:  pH 7.364  pCO2  65.1   HG/HCT    stable,      Component Value Date/Time   HGB 11.6 (L) 04/18/2020 0112   HCT 38.4 04/18/2020 0112   MCV 95.0 04/18/2020 0112    No results for input(s): LIPASE, AMYLASE in the last 168 hours. No results for input(s): AMMONIA in the last 168 hours.    Troponin  4 - 4 Cardiac  Panel (last 3 results) No results for input(s): CKTOTAL, CKMB, TROPONINI, RELINDX in the last 72 hours.    ECG: Ordered Personally reviewed by me showing: HR : 97 Rhythm:  NSR,    no evidence of ischemic changes QTC 468  BNP (last 3 results) Recent Labs    09/30/19 1412 02/15/20 0827 04/18/20 1301  BNP 39.1 30.2 16.6   DM  labs:  HbA1C: Recent Labs    04/11/20 0750  HGBA1C 6.5*      CBG (last 3)  No results for input(s): GLUCAP in the last 72 hours.     UA     ordered     CT HEAD    NON acute  CXR - combination of scarring and atelectasis though residual infection/airspace disease is not fully excluded   CTA chest -   no PE  evidence of infiltrate Patchy bibasilar consolidations, atelectasis versus pneumonia.    ED Triage Vitals  Enc Vitals Group     BP 04/18/20 0051 124/80     Pulse Rate 04/18/20 0051 (!) 118     Resp 04/18/20 0051 16     Temp 04/18/20 0051 98.8 F (37.1 C)     Temp Source 04/18/20 0051 Oral     SpO2 04/18/20 0051 96 %     Weight --      Height --      Head Circumference --      Peak Flow --      Pain Score 04/18/20 1255 10     Pain Loc --      Pain Edu? --      Excl. in [place]? --   TMAX(24)@       Latest  Blood pressure 127/71, pulse 89, temperature 99.5 F (37.5 C), temperature source Oral, resp. rate 20, SpO2 (!) 79 %.     Review of Systems:    Pertinent positives include:fatigue,shortness of breath at rest.  dyspnea on exertion,  Constitutional:  No weight loss, night sweats, Fevers, chills,  weight loss  HEENT:  No headaches, Difficulty swallowing,Tooth/dental problems,Sore throat,  No sneezing, itching, ear ache, nasal congestion, post nasal  drip,  Cardio-vascular:  No chest pain, Orthopnea, PND, anasarca, dizziness, palpitations.no Bilateral lower extremity swelling  GI:  No heartburn, indigestion, abdominal pain, nausea, vomiting, diarrhea, change in bowel habits, loss of appetite, melena, blood in stool, hematemesis Resp:  no  No excess mucus, no productive cough, No non-productive cough, No coughing up of blood.No change in color of mucus.No wheezing. Skin:  no rash or lesions. No jaundice GU:  no dysuria, change in color of urine, no urgency or frequency. No straining to urinate.  No flank pain.  Musculoskeletal:  No joint pain or no joint swelling. No decreased range of motion. No back pain.  Psych:  No change in mood or affect. No depression or anxiety. No memory loss.  Neuro: no localizing neurological complaints, no tingling, no weakness, no double vision, no gait abnormality, no slurred speech, no confusion  All systems reviewed and apart from [place] all are negative  Past Medical History:   Past Medical History:  Diagnosis Date  . Anxiety   . Arthritis   . Asthma   . Chronic diastolic CHF (congestive heart failure) ([place])   . COPD (chronic obstructive pulmonary disease) (HCC)    Uses Oxygen at night  . Depression   . Diabetes mellitus without complication ([place])   . Dyspnea    occ  . Family history of  thyroid cancer   . GERD (gastroesophageal reflux disease)   . Gout   . Headache    migraines  . History of DVT of lower extremity   . History of nuclear stress test    Myoview 2/17:  Low risk stress nuclear study with a small, moderate intensity, partially reversible inferior lateral defect consistent with small prior infarct and minimal peri-infarct ischemia; EF 68 with normal wall motion  . Hypertension   . Pneumonia   . Thyroid cancer ([place]) 09/23/2016     Past Surgical History:  Procedure Laterality Date  . BUNIONECTOMY Bilateral   . CARDIAC CATHETERIZATION N/A 06/23/2015   Procedure: Right/Left  Heart Cath and Coronary Angiography;  Surgeon: [name], MD;  Location: [facility];  Service: Cardiovascular;  Laterality: N/A;  . COLONOSCOPY WITH PROPOFOL N/A 12/15/2015   Procedure: COLONOSCOPY WITH PROPOFOL;  Surgeon: [name], MD;  Location: [facility];  Service: Endoscopy;  Laterality: N/A;  . RIGHT HEART CATH N/A 10/01/2019   Procedure: RIGHT HEART CATH;  Surgeon: [name], MD;  Location: [facility];  Service: Cardiovascular;  Laterality: N/A;  . THYROIDECTOMY  09/19/2016  . THYROIDECTOMY N/A 09/19/2016   Procedure: TOTAL THYROIDECTOMY;  Surgeon: [name], MD;  Location: [facility];  Service: General;  Laterality: N/A;  . TONSILLECTOMY    . TOTAL ABDOMINAL HYSTERECTOMY  07/14/10    Social History:  Ambulatory  Cane,      reports that she quit smoking about 3 years ago. Her smoking use included cigarettes. She has a 20.50 pack-year smoking history. She has never used smokeless tobacco. She reports previous alcohol use. She reports that she does not use drugs.   Family History: Family History  Problem Relation Age of Onset  . Cancer Father        thought to be due to exposure to concrete  . Diabetes Mother   . Thyroid cancer Mother        dx in her 54s-60s  . Cancer Maternal Uncle        2 uncles with cancer NOS  . Brain cancer Paternal Aunt   . Cancer Cousin        maternal first cousin - NOS  . Cancer Cousin        maternal first cousin - NOS    Allergies: Allergies  Allergen Reactions  . Bee Venom Swelling and Other (See Comments)    "All over my body" (swelling)  . Fioricet [Butalbital-Apap-Caffeine] Nausea And Vomiting and Rash  . Ibuprofen Rash and Other (See Comments)    Severe rash  . Lamisil [Terbinafine] Rash and Other (See Comments)    Pt states this causes her to "feel funny"  . Nsaids Other (See Comments)    Per MD's orders      Prior to Admission medications   Medication Sig Start Date End Date Taking? Authorizing  Provider  albuterol (PROVENTIL HFA;VENTOLIN HFA) 108 (90 Base) MCG/ACT inhaler Inhale 1-2 puffs into the lungs every 6 (six) hours as needed for wheezing or shortness of breath. 04/23/18   Long, [name], MD  allopurinol (ZYLOPRIM) 100 MG tablet Take 100 mg by mouth daily.    [provider]  ANORO ELLIPTA 62.5-25 MCG/INH AEPB Inhale 1 puff into the lungs daily.  04/11/18   [provider]  benzonatate (TESSALON) 200 MG capsule Take 1 capsule (200 mg total) by mouth in the morning, at [place], and at bedtime. 7 DS 04/13/20  Regalado, Belkys A, MD  Blood Glucose Monitoring Suppl (ACCU-CHEK AVIVA PLUS) w/Device KIT 1 each by Other route daily.  08/07/17   [provider]  calcitRIOL (ROCALTROL) 0.5 MCG capsule Take 1 capsule (0.5 mcg total) by mouth daily. 09/26/16   Hongalgi, Michele Dickinson, MD  carvedilol (COREG) 6.25 MG tablet Take 1 tablet (6.25 mg total) by mouth 2 (two) times daily with a meal. 01/23/18   Bensimhon, [name], MD  clonazePAM (KLONOPIN) 2 MG tablet Take 1 tablet (2 mg total) by mouth 3 (three) times daily. for anxiety 06/23/19   [name], MD  cyclobenzaprine (FLEXERIL) 10 MG tablet Take 10 mg by mouth 3 (three) times daily. 11/19/19   [provider]  diclofenac sodium (VOLTAREN) 1 % GEL Apply 2 g topically 4 (four) times daily as needed (for pain). 06/19/18   [name], MD  EPINEPHrine 0.3 mg/0.3 mL IJ SOAJ injection Inject 0.3 mg into the muscle daily as needed (allergic reaction).    [provider]  FEROSUL 325 (65 Fe) MG tablet Take 325 mg by mouth daily. 03/22/17   [provider]  glucose blood test strip Use as instructed to check blood sugar up to 4 times daily Patient taking differently: 1 each by Other route See admin instructions. Use as instructed to check blood sugar up to 4 times daily 08/06/17   [name], DO  guaiFENesin (MUCINEX) 600 MG 12 hr tablet Take 2 tablets (1,200 mg total) by mouth 2 (two) times daily.  04/13/20   Regalado, Belkys A, MD  ipratropium-albuterol (DUONEB) 0.5-2.5 (3) MG/3ML SOLN Take 3 mLs by nebulization 4 (four) times daily.  04/05/17   [provider]  levothyroxine (SYNTHROID, LEVOTHROID) 112 MCG tablet Take 224 mcg by mouth daily. 07/11/17   [provider]  lisinopril (PRINIVIL,ZESTRIL) 5 MG tablet Take 1 tablet (5 mg total) by mouth daily. Please call for office visit [phone] 08/15/17   [name], MD  loperamide (IMODIUM) 2 MG capsule Take 1 capsule (2 mg total) by mouth 4 (four) times daily as needed for diarrhea or loose stools. 5/36/64   [name], MD  metFORMIN (GLUCOPHAGE) 500 MG tablet Take 1 tablet (500 mg total) by mouth 2 (two) times daily with a meal. 12/11/14   [name], MD  methocarbamol (ROBAXIN) 500 MG tablet Take 1 tablet (500 mg total) by mouth every 8 (eight) hours as needed for muscle spasms. 06/19/18   [name], MD  nitroGLYCERIN (NITROSTAT) 0.4 MG SL tablet Place 1 tablet (0.4 mg total) under the tongue every 5 (five) minutes as needed for chest pain. 06/19/18   [name], MD  nystatin cream (MYCOSTATIN) Apply 1 application topically 2 (two) times daily as needed for dry skin. 04/13/20   Regalado, Belkys A, MD  omeprazole (PRILOSEC) 20 MG capsule Take 20 mg by mouth 2 (two) times daily before a meal. 11/19/19   [provider]  oxyCODONE-acetaminophen (PERCOCET) 10-325 MG tablet Take 1 tablet by mouth every 6 (six) hours as needed for pain. 12/03/19   [provider]  OXYGEN Inhale 2-3 L into the lungs continuous.    [provider]  potassium chloride SA (K-DUR,KLOR-CON) 20 MEQ tablet Take 1 tablet (20 mEq total) by mouth 2 (two) times daily. 07/24/18   [name], MD  QC STOOL SOFTENER PLS LAXATIVE 8.6-50 MG tablet Take 1 tablet by mouth at bedtime. 04/11/17   [provider]  rosuvastatin (CRESTOR) 10  MG tablet Take 10 mg by mouth every evening. 11/19/19   [provider]  torsemide (DEMADEX) 20 MG tablet TAKE 4 (FOUR) TABLETS BY MOUTH DAILY (2AM+ 2NOON) Patient taking differently: Take 80 mg by mouth 2 (two) times daily. 11/19/19   [name], MD  traZODone (DESYREL) 50 MG tablet Take 100 mg by mouth at bedtime. 11/19/19   [provider]   Physical Exam: Vitals with BMI 04/18/2020 04/18/2020 04/18/2020  Height - - -  Weight - - -  BMI - - -  Systolic 546 - 568  Diastolic 71 - 63  Pulse 89 101 90     1. General:  in No  Acute distress    Chronically ill  2. Psychological: Alert and Oriented 3. Head/ENT:    Dry Mucous Membranes                          Head Non traumatic, neck supple                          Poor Dentition 4. SKIN: decreased Skin turgor,  Skin clean Dry and intact no rash 5. Heart: Regular rate and rhythm no Murmur, no Rub or gallop 6. Lungs:   no wheezes or crackles  distant 7. Abdomen: Soft, non-tender, Non distended  bowel sounds present 8. Lower extremities: no clubbing, cyanosis, no edema 9. Neurologically Grossly intact, moving all 4 extremities equally   10. MSK: Normal range of motion   All other LABS:     Recent Labs  Lab 04/12/20 0836 04/18/20 0112  WBC 15.9* 22.2*  HGB 10.2* 11.6*  HCT 32.2* 38.4  MCV 94.7 95.0  PLT 264 295     Recent Labs  Lab 04/12/20 0836 04/13/20 0303 04/18/20 0112  NA 137 140 139  K 3.4* 3.8 4.0  CL 95* 94* 96*  CO2 29 35* 31  GLUCOSE 189* 108* 123*  BUN '6 8 13  ' CREATININE 0.66 0.68 0.84  CALCIUM 7.9* 8.6* 8.6*     No results for input(s): AST, ALT, ALKPHOS, BILITOT, PROT, ALBUMIN in the last 168 hours.     Cultures:    Component Value Date/Time   SDES URINE, RANDOM 04/11/2020 0037   SPECREQUEST NONE 04/11/2020 0037   CULT (A) 04/11/2020 0037    <10,000 COLONIES/mL INSIGNIFICANT GROWTH Performed at [facility], [place] [address], [place], [place] [address]    REPTSTATUS 04/12/2020 FINAL 04/11/2020 0037     Radiological Exams on Admission: DG Chest  1 View  Result Date: 04/18/2020 CLINICAL DATA:  Recent pneumonia EXAM: CHEST  1 VIEW COMPARISON:  Radiograph 04/10/2020 CT abdomen pelvis 04/10/2020 for comparison the lung bases, radiograph 02/15/2020 FINDINGS: Persistent mixed patchy and linear opacities in the lung bases, some of which is likely reflect a combination of scarring and atelectasis though residual infection/airspace disease is not fully excluded. No new consolidative opacities or convincing features of edema. No pneumothorax or visible effusion. Stable cardiomediastinal contours with a calcified aorta. No acute osseous or soft tissue abnormality. Degenerative changes are present in the imaged spine and shoulders. IMPRESSION: Persistent mixed patchy and linear opacities in the lung bases, some of which is likely reflect a combination of scarring and atelectasis though residual infection/airspace disease is not fully excluded. Electronically Signed   By: Lovena Le M.D.   On: 04/18/2020 01:29   CT Angio Chest PE W and/or Wo  Contrast  Result Date: 04/18/2020 CLINICAL DATA:  Hemoptysis.  Recent admission for pneumonia. EXAM: CT ANGIOGRAPHY CHEST WITH CONTRAST TECHNIQUE: Multidetector CT imaging of the chest was performed using the standard protocol during bolus administration of intravenous contrast. Multiplanar CT image reconstructions and MIPs were obtained to evaluate the vascular anatomy. CONTRAST:  46m OMNIPAQUE IOHEXOL 350 MG/ML SOLN COMPARISON:  Chest CT angiogram dated 06/28/2019. FINDINGS: Cardiovascular: Majority of the peripheral segmental and subsegmental pulmonary branches to the lower lobes cannot be definitively characterized due to patient breathing motion artifact, however, there is no pulmonary embolism identified within the main, lobar or central segmental pulmonary artery branches bilaterally. Cannot exclude small nonocclusive pulmonary emboli. No thoracic aortic aneurysm or evidence of aortic dissection. No pericardial  effusion. Mild aortic atherosclerosis. Mediastinum/Nodes: No mass or enlarged lymph nodes are seen within the mediastinum or perihilar regions. Esophagus is unremarkable. Trachea appears normal. Lungs/Pleura: Patchy bibasilar consolidations, atelectasis versus pneumonia. Upper lungs are clear. No pleural effusion or pneumothorax. Upper Abdomen: Limited images of the upper abdomen are unremarkable. Musculoskeletal: Mild degenerative spondylosis of the slightly scoliotic thoracic spine. No acute appearing osseous abnormality. Review of the MIP images confirms the above findings. IMPRESSION: 1. No central obstructing pulmonary embolism is identified. However, due to patient breathing motion artifact, small nonocclusive pulmonary emboli or peripheral pulmonary emboli cannot be confidently excluded. If symptoms persist or worsen, consider repeat chest CT angiogram if patient can perform adequate breath hold. 2. Patchy bibasilar consolidations, atelectasis versus pneumonia. Aortic Atherosclerosis (ICD10-I70.0). Electronically Signed   By: SFranki CabotM.D.   On: 04/18/2020 08:56    Chart has been reviewed  Assessment/Plan   60y.o. female with medical history significant of OSA, Chronic diastolic HTN Chronic respiratory failure with hypoxia on 3 L of oxygen baseline, chronic pain, hypothyroidism, COPD,, gout  Admitted for acute on chronic respiratory failure and pneumonia  sepsis  Present on Admission: . acute on Chronic respiratory failure with hypoxia and hypercapnia (HCC)  -  this patient has acute respiratory failure with Hypoxia and  Hypercarbia as documented by the presence of following: O2 saturatio< 90% on 3L and pCO2 >60  Likely due to:  Pneumonia, and hemoptysis Provide O2 therapy and titrate as needed  Continuous pulse ox   check Pulse ox with ambulation prior to discharge   may need  TC consult for home O2 set up  pt intermittently somnolent will hold off on BIPAP for now given hemoptysis  and PNA appreciate PCCM consult  Sepsis -   -SIRS criteria met with  elevated white blood cell count,       Component Value Date/Time   WBC 22.2 (H) 04/18/2020 0112   LYMPHSABS 2.7 04/11/2020 0750     tachycardia    Today's Vitals   04/18/20 1600 04/18/20 1700 04/18/20 1730 04/18/20 1800  BP: (!) 142/80 124/63  127/71  Pulse: 91 90 (!) 101 89  Resp: '20 20 16 20  ' Temp:      TempSrc:      SpO2: 96% 99% 96% (!) 79%  PainSc:         -Most likely source being:   Pulmonary   Patient meeting criteria for Severe sepsis with    evidence of end organ damage/organ dysfunction such as   acute metabolic encephalopathy    Acute hypoxia requiring new supplemental oxygen, SpO2: (!) 79 % O2 Flow Rate (L/min): 4 L/min      - Obtain serial lactic acid and procalcitonin level.  - Initiated  IV antibiotics vanc and cefepime 04/19/19  - await results of blood and urine culture  - Rehydrate     10:47 PM   Acute encephalopathy -   - most likely multifactorial secondary to combination of  infection   mild dehydration secondary to decreased by mouth intake,  polypharmacy   - Will rehydrate gently   - treat underlining infection   - Hold contributing medications   - if no improvement may need further imaging to evaluate for CNS pathology pathology such as MRI of the brain  Per notes patient has history of daily somnolence secondary to obesity hypoventilation syndrome  - neurological exam appears to be nonfocal but patient unable to cooperate fully  CT head ordered  - VBG   evidence of hypercarbia but chronic well compensated   - no history of liver disease ammonia pending   . Hemoptysis - lilkely due to infectious etiology would benefit from pulmonology Consult   . Tobacco abuse - states she quit but somnolent at the time  . Pulmonary hypertension (HCC)-chronic, appreciate pulmonology input  . OSA (obstructive sleep apnea) -nocturnal oxygen, pt states she is possibly on CPAP at home but not  confirmed on last note  . Obesity hypoventilation syndrome (HCC) -nocturnal oxygen, followed by Dr. Halford Chessman will continue   . Obesity -nutrition will need nutritional consult as elevated  . HLD (hyperlipidemia) -chronic Continue home meds   type 2 diabetes mellitus (HCC) -  - Order Sensitive SSI     -  check TSH and HgA1C  - Hold by mouth medications    Chronic pain -hold home medications given intermittent somnolence If continues to be somnolent may need Narcan for reversal try to see if that would help  Hypothyroidism - - Check TSH continue home medications at current dose  Chronic diastolic CHF - - currently appears to be slightly on the dry side, hold home diuretics for tonight and restart when appears euvolemic, carefuly follow fluid status and Cr   Other plan as per orders.  DVT prophylaxis:  SCD      Code Status:    Code Status: Prior FULL CODE  as per patient   I had personally discussed CODE STATUS with patient     Family Communication:   Family not at  Bedside    Disposition Plan:      To home once workup is complete and patient is stable   Following barriers for discharge:                                                           Afebrile, white count improving able to transition to PO antibiotics                             Will need to be able to tolerate PO                                                Will need consultants to evaluate patient prior to discharge  Would benefit from PT/OT eval prior to DC  Ordered                                     Nutrition    consulted                   Consults called:  Pulmonology consulted  Admission status:  ED Disposition    ED Disposition Condition [place]: Olivia Lopez de Gutierrez [[id number]]  Level of Care: Progressive [102]  Admit to Progressive based on following criteria: RESPIRATORY PROBLEMS hypoxemic/hypercapnic respiratory failure that is responsive to NIPPV  (BiPAP) or High Flow Nasal Cannula (6-80 lpm). Frequent assessment/intervention, no > Q2 hrs < Q4 hrs, to maintain oxygenation and pulmonary hygiene.  May admit patient to [facility] or [facility] if equivalent level of care is available:: No  Covid Evaluation: Confirmed COVID Negative  Diagnosis: Acute on chronic respiratory failure with hypoxia [facility]) [[id number]]  Admitting Physician: Toy Baker [[id number]]  Attending Physician: Toy Baker [[id number]]  Estimated length of stay: 3 - 4 days  Certification:: I certify this patient will need inpatient services for at least 2 midnights          inpatient     I Expect 2 midnight stay secondary to severity of patient's current illness need for inpatient interventions justified by the following:  hemodynamic instability despite optimal treatment ( hypoxia )  Severe lab/radiological/exam abnormalities including:    PNA  and extensive comorbidities including:  Chronic pain  DM2   CHF  COPD/asthma  Morbid Obesity   That are currently affecting medical management.   I expect  patient to be hospitalized for 2 midnights requiring inpatient medical care.  Patient is at high risk for adverse outcome (such as loss of life or disability) if not treated.  Indication for inpatient stay as follows:    Hemodynamic instability despite maximal medical therapy,    New or worsening hypoxia  Need for IV antibiotics,     Level of care       SDU tele indefinitely please discontinue once patient no longer qualifies COVID-19 Labs    Lab Results  Component Value Date   [place] NEGATIVE 04/18/2020     Precautions: admitted as  Covid Negative      PPE: Used by the provider:   P100  eye Goggles,  Gloves    Anastassia Doutova 04/18/2020, 10:46 PM    [facility]     after 2 AM please page floor coverage PA If 7AM-7PM, please contact the day team taking care of the patient using [email or web address]   Patient was evaluated in the  context of the global COVID-19 pandemic, which necessitated consideration that the patient might be at risk for infection with the SARS-CoV-2 virus that causes COVID-19. Institutional protocols and algorithms that pertain to the evaluation of patients at risk for COVID-19 are in a state of rapid change based on information released by regulatory bodies including the CDC and federal and state organizations. These policies and algorithms were followed during the patient's care.

## 2020-04-18 NOTE — Progress Notes (Signed)
Pharmacy Antibiotic Note  Michele Owens is a 60 y.o. female admitted on 04/18/2020 with pneumonia, recently tx for pna with 3d ceftriaxone/azithro and d/c home no keflex/azithro.  Pharmacy has been consulted for vancomycin and cefepime dosing.  Plan: Cefepime 2g IV every 8 hours Vancomycin 2000 mg IV x 1, then 1000 mg IV every 12 hours (Est AUC 499, Goal AUC 400-550, SCr 0.84) Add MRSA PCR Monitor renal function, Cx/PCR and clinical progression to narrow Vancomycin level as needed      Temp (24hrs), Avg:99.2 F (37.3 C), Min:98.8 F (37.1 C), Max:99.5 F (37.5 C)  Recent Labs  Lab 04/12/20 0836 04/13/20 0303 04/18/20 0112 04/18/20 1303 04/18/20 1616  WBC 15.9*  --  22.2*  --   --   CREATININE 0.66 0.68 0.84  --   --   LATICACIDVEN  --   --   --  1.9 1.5    CrCl cannot be calculated (Unknown ideal weight.).    Allergies  Allergen Reactions  . Bee Venom Swelling and Other (See Comments)    "All over my body" (swelling)  . Fioricet [Butalbital-Apap-Caffeine] Nausea And Vomiting and Rash  . Ibuprofen Rash and Other (See Comments)    Severe rash  . Lamisil [Terbinafine] Rash and Other (See Comments)    Pt states this causes her to "feel funny"  . Nsaids Other (See Comments)    Per MD's orders     Bertis Ruddy, PharmD Clinical Pharmacist ED Pharmacist Phone # [phone] 04/18/2020 7:28 PM

## 2020-04-18 NOTE — ED Notes (Signed)
Pt was taken to vending area and bought food and drink.

## 2020-04-18 NOTE — ED Notes (Signed)
Pt ambulated from wheelchair to bed w/ 2 person assist.

## 2020-04-19 DIAGNOSIS — J9611 Chronic respiratory failure with hypoxia: Secondary | ICD-10-CM | POA: Diagnosis not present

## 2020-04-19 DIAGNOSIS — G9340 Encephalopathy, unspecified: Secondary | ICD-10-CM | POA: Diagnosis not present

## 2020-04-19 DIAGNOSIS — Z6841 Body Mass Index (BMI) 40.0 and over, adult: Secondary | ICD-10-CM | POA: Diagnosis not present

## 2020-04-19 DIAGNOSIS — G9341 Metabolic encephalopathy: Secondary | ICD-10-CM | POA: Diagnosis not present

## 2020-04-19 DIAGNOSIS — I5032 Chronic diastolic (congestive) heart failure: Secondary | ICD-10-CM | POA: Diagnosis not present

## 2020-04-19 DIAGNOSIS — E662 Morbid (severe) obesity with alveolar hypoventilation: Secondary | ICD-10-CM | POA: Diagnosis not present

## 2020-04-19 DIAGNOSIS — G4733 Obstructive sleep apnea (adult) (pediatric): Secondary | ICD-10-CM | POA: Diagnosis not present

## 2020-04-19 DIAGNOSIS — F419 Anxiety disorder, unspecified: Secondary | ICD-10-CM | POA: Diagnosis not present

## 2020-04-19 DIAGNOSIS — E119 Type 2 diabetes mellitus without complications: Secondary | ICD-10-CM | POA: Diagnosis not present

## 2020-04-19 DIAGNOSIS — J9621 Acute and chronic respiratory failure with hypoxia: Secondary | ICD-10-CM | POA: Diagnosis not present

## 2020-04-19 LAB — URINALYSIS, ROUTINE W REFLEX MICROSCOPIC
Bilirubin Urine: NEGATIVE
Glucose, UA: NEGATIVE mg/dL
Hgb urine dipstick: NEGATIVE
Ketones, ur: NEGATIVE mg/dL
Leukocytes,Ua: NEGATIVE
Nitrite: NEGATIVE
Protein, ur: NEGATIVE mg/dL
Specific Gravity, Urine: 1.026 (ref 1.005–1.030)
pH: 5 (ref 5.0–8.0)

## 2020-04-19 LAB — GLUCOSE, CAPILLARY
Glucose-Capillary: 106 mg/dL — ABNORMAL HIGH (ref 70–99)
Glucose-Capillary: 126 mg/dL — ABNORMAL HIGH (ref 70–99)

## 2020-04-19 LAB — COMPREHENSIVE METABOLIC PANEL
ALT: 16 U/L (ref 0–44)
AST: 17 U/L (ref 15–41)
Albumin: 3.5 g/dL (ref 3.5–5.0)
Alkaline Phosphatase: 68 U/L (ref 38–126)
Anion gap: 10 (ref 5–15)
BUN: 5 mg/dL — ABNORMAL LOW (ref 6–20)
CO2: 31 mmol/L (ref 22–32)
Calcium: 8.8 mg/dL — ABNORMAL LOW (ref 8.9–10.3)
Chloride: 98 mmol/L (ref 98–111)
Creatinine, Ser: 0.5 mg/dL (ref 0.44–1.00)
GFR, Estimated: >60 mL/min (ref >60)
Glucose, Bld: 107 mg/dL — ABNORMAL HIGH (ref 70–99)
Potassium: 4.1 mmol/L (ref 3.5–5.1)
Sodium: 139 mmol/L (ref 135–145)
Total Bilirubin: 0.5 mg/dL (ref 0.3–1.2)
Total Protein: 7.4 g/dL (ref 6.5–8.1)

## 2020-04-19 LAB — CBC WITH DIFFERENTIAL/PLATELET
Abs Immature Granulocytes: 0.16 10*3/uL — ABNORMAL HIGH (ref 0.00–0.07)
Basophils Absolute: 0.1 10*3/uL (ref 0.0–0.1)
Basophils Relative: 0 %
Eosinophils Absolute: 0.4 10*3/uL (ref 0.0–0.5)
Eosinophils Relative: 2 %
HCT: 38.2 % (ref 36.0–46.0)
Hemoglobin: 11.9 g/dL — ABNORMAL LOW (ref 12.0–15.0)
Immature Granulocytes: 1 %
Lymphocytes Relative: 13 %
Lymphs Abs: 1.9 10*3/uL (ref 0.7–4.0)
MCH: 29.6 pg (ref 26.0–34.0)
MCHC: 31.2 g/dL (ref 30.0–36.0)
MCV: 95 fL (ref 80.0–100.0)
Monocytes Absolute: 0.7 10*3/uL (ref 0.1–1.0)
Monocytes Relative: 4 %
Neutro Abs: 12.1 10*3/uL — ABNORMAL HIGH (ref 1.7–7.7)
Neutrophils Relative %: 80 %
Platelets: 299 10*3/uL (ref 150–400)
RBC: 4.02 MIL/uL (ref 3.87–5.11)
RDW: 13.6 % (ref 11.5–15.5)
WBC: 15.3 10*3/uL — ABNORMAL HIGH (ref 4.0–10.5)
nRBC: 0 % (ref 0.0–0.2)

## 2020-04-19 LAB — HEMOGLOBIN A1C
Hgb A1c MFr Bld: 6.2 % — ABNORMAL HIGH (ref 4.8–5.6)
Mean Plasma Glucose: 131.24 mg/dL

## 2020-04-19 LAB — RAPID URINE DRUG SCREEN, HOSP PERFORMED
Amphetamines: NOT DETECTED
Barbiturates: POSITIVE — AB
Benzodiazepines: POSITIVE — AB
Cocaine: NOT DETECTED
Opiates: NOT DETECTED
Tetrahydrocannabinol: NOT DETECTED

## 2020-04-19 LAB — AMMONIA: Ammonia: 46 umol/L — ABNORMAL HIGH (ref 9–35)

## 2020-04-19 LAB — CBG MONITORING, ED
Glucose-Capillary: 179 mg/dL — ABNORMAL HIGH (ref 70–99)
Glucose-Capillary: 112 mg/dL — ABNORMAL HIGH (ref 70–99)
Glucose-Capillary: 134 mg/dL — ABNORMAL HIGH (ref 70–99)

## 2020-04-19 LAB — PROCALCITONIN: Procalcitonin: <0.1 ng/mL

## 2020-04-19 LAB — TSH
TSH: 1.236 u[IU]/mL (ref 0.350–4.500)
TSH: 1.069 u[IU]/mL (ref 0.350–4.500)

## 2020-04-19 LAB — MAGNESIUM: Magnesium: 2.3 mg/dL (ref 1.7–2.4)

## 2020-04-19 LAB — MRSA PCR SCREENING: MRSA by PCR: NEGATIVE

## 2020-04-19 LAB — PHOSPHORUS: Phosphorus: 4 mg/dL (ref 2.5–4.6)

## 2020-04-19 MED ORDER — OXYCODONE HCL 5 MG PO TABS
5.0000 mg | ORAL_TABLET | Freq: Four times a day (QID) | ORAL | Status: DC | PRN
  Administered 2020-04-19 – 2020-04-20 (×3): 5 mg via ORAL
  Filled 2020-04-19 (×3): qty 1

## 2020-04-19 MED ORDER — VANCOMYCIN HCL 2000 MG/400ML IV SOLN
2000.0000 mg | INTRAVENOUS | Status: AC
  Administered 2020-04-19: 2000 mg via INTRAVENOUS
  Filled 2020-04-19: qty 400

## 2020-04-19 MED ORDER — OXYCODONE-ACETAMINOPHEN 10-325 MG PO TABS: 1.0000 | ORAL_TABLET | Freq: Four times a day (QID) | ORAL | Status: DC | PRN

## 2020-04-19 MED ORDER — FERROUS SULFATE 325 (65 FE) MG PO TABS
325.0000 mg | ORAL_TABLET | Freq: Every day | ORAL | Status: DC
  Administered 2020-04-19 – 2020-04-20 (×2): 325 mg via ORAL
  Filled 2020-04-19 (×2): qty 1

## 2020-04-19 MED ORDER — CLONAZEPAM 0.5 MG PO TABS
2.0000 mg | ORAL_TABLET | Freq: Three times a day (TID) | ORAL | Status: DC
  Administered 2020-04-19 – 2020-04-20 (×4): 2 mg via ORAL
  Filled 2020-04-19 (×4): qty 4

## 2020-04-19 MED ORDER — IPRATROPIUM-ALBUTEROL 0.5-2.5 (3) MG/3ML IN SOLN
3.0000 mL | Freq: Four times a day (QID) | RESPIRATORY_TRACT | Status: DC
  Administered 2020-04-19 (×3): 3 mL via RESPIRATORY_TRACT
  Filled 2020-04-19 (×3): qty 3

## 2020-04-19 MED ORDER — ALBUTEROL SULFATE HFA 108 (90 BASE) MCG/ACT IN AERS: 1.0000 | INHALATION_SPRAY | Freq: Four times a day (QID) | RESPIRATORY_TRACT | Status: DC | PRN

## 2020-04-19 MED ORDER — OXYCODONE-ACETAMINOPHEN 5-325 MG PO TABS
1.0000 | ORAL_TABLET | Freq: Four times a day (QID) | ORAL | Status: DC | PRN
  Administered 2020-04-19: 1 via ORAL
  Filled 2020-04-19: qty 1

## 2020-04-19 MED ORDER — CARVEDILOL 6.25 MG PO TABS
6.2500 mg | ORAL_TABLET | Freq: Two times a day (BID) | ORAL | Status: DC
  Administered 2020-04-19 – 2020-04-20 (×3): 6.25 mg via ORAL
  Filled 2020-04-19 (×2): qty 1
  Filled 2020-04-19: qty 2

## 2020-04-19 MED ORDER — ACETAMINOPHEN 325 MG PO TABS: 650.0000 mg | ORAL_TABLET | Freq: Four times a day (QID) | ORAL | Status: DC | PRN

## 2020-04-19 MED ORDER — PANTOPRAZOLE SODIUM 40 MG PO TBEC
40.0000 mg | DELAYED_RELEASE_TABLET | Freq: Every day | ORAL | Status: DC
  Administered 2020-04-19 – 2020-04-20 (×2): 40 mg via ORAL
  Filled 2020-04-19 (×2): qty 1

## 2020-04-19 MED ORDER — LEVOTHYROXINE SODIUM 112 MCG PO TABS
224.0000 ug | ORAL_TABLET | Freq: Every day | ORAL | Status: DC
  Administered 2020-04-19 – 2020-04-20 (×2): 224 ug via ORAL
  Filled 2020-04-19 (×3): qty 2

## 2020-04-19 MED ORDER — LISINOPRIL 5 MG PO TABS
5.0000 mg | ORAL_TABLET | Freq: Every day | ORAL | Status: DC
  Administered 2020-04-19 – 2020-04-20 (×2): 5 mg via ORAL
  Filled 2020-04-19 (×2): qty 1

## 2020-04-19 MED ORDER — VANCOMYCIN HCL IN DEXTROSE 1-5 GM/200ML-% IV SOLN
1000.0000 mg | Freq: Two times a day (BID) | INTRAVENOUS | Status: DC
  Administered 2020-04-19: 1000 mg via INTRAVENOUS
  Filled 2020-04-19: qty 200

## 2020-04-19 MED ORDER — ACETAMINOPHEN 650 MG RE SUPP: 650.0000 mg | Freq: Four times a day (QID) | RECTAL | Status: DC | PRN

## 2020-04-19 MED ORDER — ALBUTEROL SULFATE (2.5 MG/3ML) 0.083% IN NEBU: 2.5000 mg | INHALATION_SOLUTION | RESPIRATORY_TRACT | Status: DC | PRN

## 2020-04-19 MED ORDER — HYDROCODONE-ACETAMINOPHEN 5-325 MG PO TABS: 1.0000 | ORAL_TABLET | ORAL | Status: DC | PRN

## 2020-04-19 MED ORDER — UMECLIDINIUM-VILANTEROL 62.5-25 MCG/INH IN AEPB
1.0000 | INHALATION_SPRAY | Freq: Every day | RESPIRATORY_TRACT | Status: DC
  Filled 2020-04-19 (×2): qty 14

## 2020-04-19 MED ORDER — ROSUVASTATIN CALCIUM 5 MG PO TABS
10.0000 mg | ORAL_TABLET | Freq: Every evening | ORAL | Status: DC
  Administered 2020-04-19: 10 mg via ORAL
  Filled 2020-04-19: qty 2

## 2020-04-19 MED ORDER — CYCLOBENZAPRINE HCL 10 MG PO TABS
10.0000 mg | ORAL_TABLET | Freq: Three times a day (TID) | ORAL | Status: DC
  Administered 2020-04-19 – 2020-04-20 (×4): 10 mg via ORAL
  Filled 2020-04-19 (×4): qty 1

## 2020-04-19 MED ORDER — ALLOPURINOL 100 MG PO TABS
100.0000 mg | ORAL_TABLET | Freq: Every day | ORAL | Status: DC
  Administered 2020-04-19 – 2020-04-20 (×2): 100 mg via ORAL
  Filled 2020-04-19 (×2): qty 1

## 2020-04-19 MED ORDER — CALCITRIOL 0.5 MCG PO CAPS
0.5000 ug | ORAL_CAPSULE | Freq: Every day | ORAL | Status: DC
  Administered 2020-04-19 – 2020-04-20 (×2): 0.5 ug via ORAL
  Filled 2020-04-19 (×2): qty 1

## 2020-04-19 MED ORDER — TRAZODONE HCL 100 MG PO TABS
100.0000 mg | ORAL_TABLET | Freq: Every day | ORAL | Status: DC
  Administered 2020-04-19: 100 mg via ORAL
  Filled 2020-04-19: qty 1

## 2020-04-19 MED ORDER — DM-GUAIFENESIN ER 30-600 MG PO TB12
1.0000 | ORAL_TABLET | Freq: Two times a day (BID) | ORAL | Status: DC
  Administered 2020-04-19 – 2020-04-20 (×2): 1 via ORAL
  Filled 2020-04-19 (×3): qty 1

## 2020-04-19 NOTE — ED Notes (Signed)
Lunch Tray Ordered 1115. 

## 2020-04-19 NOTE — ED Notes (Signed)
Unable to collect urine the times I have helped to use the bedside commode she has had a bm also

## 2020-04-19 NOTE — ED Notes (Signed)
Unable to obtain a urine specimen due to pt having bowel movement every time she uses the bedside toilet

## 2020-04-19 NOTE — ED Notes (Signed)
The pt has been on her call bell all night long  She wants to eat all night   Then she falls sound asleep  Very demanding asking for pain med  shes not undressed she refuses to put on a gown

## 2020-04-19 NOTE — ED Notes (Signed)
Pt up to edside commmode  For anout the 8th time tonight  She had a large formed stool med brown   She reports that she is unable to wipe herself

## 2020-04-19 NOTE — ED Notes (Signed)
SDU Breakfast Ordered 

## 2020-04-19 NOTE — ED Notes (Signed)
Pt transferred to hospital bed for comfort.

## 2020-04-19 NOTE — Progress Notes (Signed)
NAME:  Michele Owens, MRN:  [id number], DOB:  1960/05/19, LOS: 1 ADMISSION DATE:  04/18/2020, CONSULTATION DATE:  04/19/2019 REFERRING MD:  Dr Roel Cluck , CHIEF COMPLAINT:  Hemoptysis, SOB   Brief History:  60 yo woman with hemoptysis at home, dyspnea, malaise fatigue.    History of Present Illness:   60 yo woman with c/o hemoptysis, chest tightness, dyspnea, malaise, chills and fatigue.  Increasing SOB x several days.     Recent admit, discharged 1/4, treated for CAP.   Started on Cefepime and vanc WBC up to 22.2 from 15.9 ABG consistent with her basline, except PaO2 is 54. 9pm.   Compensated resp acidosis.  BNP 16.6 trop 4 procal negative.  Blood cultures pending  Was feeling better but became more dyspneic over the past few days.   Coughing blood clots prior to admission to ED.    Past Medical History:   Past Medical History:  Diagnosis Date  . Anxiety   . Arthritis   . Asthma   . Chronic diastolic CHF (congestive heart failure) ([place])   . COPD (chronic obstructive pulmonary disease) ([facility])    Uses Oxygen at night  . Depression   . Diabetes mellitus without complication ([place])   . Dyspnea    occ  . Family history of thyroid cancer   . GERD (gastroesophageal reflux disease)   . Gout   . Headache    migraines  . History of DVT of lower extremity   . History of nuclear stress test    Myoview 2/17:  Low risk stress nuclear study with a small, moderate intensity, partially reversible inferior lateral defect consistent with small prior infarct and minimal peri-infarct ischemia; EF 68 with normal wall motion  . Hypertension   . Pneumonia   . Thyroid cancer ([place]) 09/23/2016   Significant Hospital Events:  not coughing up any blood  Consults:  pulmonary  Procedures:    Significant Diagnostic Tests:  CT head  IMPRESSION: No acute intracranial abnormality.  CTA: IMPRESSION: 1. No central obstructing pulmonary embolism is identified. However, due to patient  breathing motion artifact, small nonocclusive pulmonary emboli or peripheral pulmonary emboli cannot be confidently excluded. If symptoms persist or worsen, consider repeat chest CT angiogram if patient can perform adequate breath hold. 2. Patchy bibasilar consolidations, atelectasis versus pneumonia.  CXR IMPRESSION: Persistent mixed patchy and linear opacities in the lung bases, some of which is likely reflect a combination of scarring and atelectasis though residual infection/airspace disease is not fully excluded.  Echo 5/21 EF 55-60, essentially nl  R Heart cath 6/21 1. Mildly elevated RA pressure and PCWP.  2. Mild pulmonary venous hypertension.  RA mean 11 RV 50/13 PA 44/14, mean 27 PCWP mean 15  Micro Data:  Blood cultures pending Urinalysis pending  Antimicrobials:  Cefepime vanc doxycycline  Interim History / Subjective:  Not coughing up any blood Coughing intermittently Denies any pain, feels tired  Objective   Blood pressure (!) 178/87, pulse 95, temperature 99.5 F (37.5 C), temperature source Oral, resp. rate 20, SpO2 95 %.       No intake or output data in the 24 hours ending 04/19/20 0818 There were no vitals filed for this visit.  Examination: General: In no acute distress, sleepy HENT: Moist oral mucosa Lungs: Decreased air movement bilaterally Cardiovascular: S1-S2 appreciated Abdomen: Soft, bowel sounds appreciated Extremities: No clubbing, minimal edema Neuro: Alert and oriented x3 GU:   Resolved Hospital Problem list     Assessment & Plan:  Acute hypoxemic/hypercapnic respiratory failure -Chronically on 2 L of oxygen at home -Currently on 4 L of oxygen  Hemoptysis -Likely related to recent pneumonia -Has not coughed up any blood since being in the emergency department for over 24 hours  History of obstructive sleep apnea -Last sleep study was negative for significant sleep disordered breathing although did not sleep very  well -Needs to focus on significant weight loss  Patient likely has some degree of obesity hypoventilation with chronic hypercarbia -May need BiPAP -Currently well compensated based off of the ABG  Pneumonia -Was treated during last admission -monitor leukocytosis -Currently on multiple antibiotics -Procalcitonin is normal -Need to de-escalate within 72 hours if no positive cultures -Follow MRSA PCR and take off vancomycin if not needed -Cefepime may also be taken within 72 hours and complete course of doxycycline  History of heart failure -Euvolemic -Appears compensated at present  History of obstructive lung disease -continue bronchodilator treatments  Head CT negative  NO clear indication for any invasive intervention at present  Best practice (evaluated daily)  Diet: 2 g sodium diet Pain/Anxiety/Delirium protocol (if indicated): As needed  VAP protocol (if indicated): Not indicated DVT prophylaxis: scd GI prophylaxis:  Glucose control:  Mobility: as tolerated Disposition:Med surg   Labs   CBC: Recent Labs  Lab 04/12/20 0836 04/18/20 0112 04/18/20 2054  WBC 15.9* 22.2*  --   HGB 10.2* 11.6* 13.3  HCT 32.2* 38.4 39.0  MCV 94.7 95.0  --   PLT 264 295  --     Basic Metabolic Panel: Recent Labs  Lab 04/12/20 0836 04/13/20 0303 04/18/20 0112 04/18/20 1918 04/18/20 2054  NA 137 140 139  --  140  K 3.4* 3.8 4.0  --  3.7  CL 95* 94* 96*  --   --   CO2 29 35* 31  --   --   GLUCOSE 189* 108* 123*  --   --   BUN 6 8 13   --   --   CREATININE 0.66 0.68 0.84  --   --   CALCIUM 7.9* 8.6* 8.6*  --   --   MG  --   --   --  2.2  --    GFR: CrCl cannot be calculated (Unknown ideal weight.). Recent Labs  Lab 04/12/20 0836 04/13/20 0303 04/18/20 0112 04/18/20 1303 04/18/20 1616 04/18/20 1918 04/19/20 0231  PROCALCITON  --  <0.10  --   --   --  <0.10 <0.10  WBC 15.9*  --  22.2*  --   --   --   --   LATICACIDVEN  --   --   --  1.9 1.5  --   --      Liver Function Tests: No results for input(s): AST, ALT, ALKPHOS, BILITOT, PROT, ALBUMIN in the last 168 hours. No results for input(s): LIPASE, AMYLASE in the last 168 hours. Recent Labs  Lab 04/19/20 0231  AMMONIA 46*    ABG    Component Value Date/Time   PHART 7.307 (L) 08/04/2017 0616   PCO2ART 63.7 (H) 08/04/2017 0616   PO2ART 128 (H) 08/04/2017 0616   HCO3 37.1 (H) 04/18/2020 2054   TCO2 39 (H) 04/18/2020 2054   O2SAT 85.0 04/18/2020 2054     Coagulation Profile: Recent Labs  Lab 04/18/20 1918  INR 1.0    Cardiac Enzymes: No results for input(s): CKTOTAL, CKMB, CKMBINDEX, TROPONINI in the last 168 hours.  HbA1C: Hgb A1c MFr Bld  Date/Time Value Ref Range  Status  04/19/2020 02:31 AM 6.2 (H) 4.8 - 5.6 % Final    Comment:    (NOTE) Pre diabetes:          5.7%-6.4%  Diabetes:              >6.4%  Glycemic control for   <7.0% adults with diabetes   04/11/2020 07:50 AM 6.5 (H) 4.8 - 5.6 % Final    Comment:    (NOTE) Pre diabetes:          5.7%-6.4%  Diabetes:              >6.4%  Glycemic control for   <7.0% adults with diabetes     CBG: Recent Labs  Lab 04/12/20 1644 04/12/20 1958 04/13/20 0701 04/13/20 1136 04/19/20 0102  GLUCAP 137* 118* 138* 123* 179*    Review of Systems:   SOB  Past Medical History:  She,  has a past medical history of Anxiety, Arthritis, Asthma, Chronic diastolic CHF (congestive heart failure) ([place]), COPD (chronic obstructive pulmonary disease) ([place]), Depression, Diabetes mellitus without complication ([place]), Dyspnea, Family history of thyroid cancer, GERD (gastroesophageal reflux disease), Gout, Headache, History of DVT of lower extremity, History of nuclear stress test, Hypertension, Pneumonia, and Thyroid cancer ([place]) (09/23/2016).   Surgical History:   Past Surgical History:  Procedure Laterality Date  . BUNIONECTOMY Bilateral   . CARDIAC CATHETERIZATION N/A 06/23/2015   Procedure: Right/Left Heart Cath and  Coronary Angiography;  Surgeon: [name], MD;  Location: [place] [facility];  Service: Cardiovascular;  Laterality: N/A;  . COLONOSCOPY WITH PROPOFOL N/A 12/15/2015   Procedure: COLONOSCOPY WITH PROPOFOL;  Surgeon: [name], MD;  Location: [place] [facility];  Service: Endoscopy;  Laterality: N/A;  . RIGHT HEART CATH N/A 10/01/2019   Procedure: RIGHT HEART CATH;  Surgeon: [name], MD;  Location: [place] [facility];  Service: Cardiovascular;  Laterality: N/A;  . THYROIDECTOMY  09/19/2016  . THYROIDECTOMY N/A 09/19/2016   Procedure: TOTAL THYROIDECTOMY;  Surgeon: [name], MD;  Location: [facility];  Service: General;  Laterality: N/A;  . TONSILLECTOMY    . TOTAL ABDOMINAL HYSTERECTOMY  07/14/10     Social History:   reports that she quit smoking about 3 years ago. Her smoking use included cigarettes. She has a 20.50 pack-year smoking history. She has never used smokeless tobacco. She reports previous alcohol use. She reports that she does not use drugs.   Family History:  Her family history includes Brain cancer in her paternal aunt; Cancer in her cousin, cousin, father, and maternal uncle; Diabetes in her mother; Thyroid cancer in her mother.   Allergies Allergies  Allergen Reactions  . Bee Venom Swelling and Other (See Comments)    "All over my body" (swelling)  . Fioricet [Butalbital-Apap-Caffeine] Nausea And Vomiting and Rash  . Ibuprofen Rash and Other (See Comments)    Severe rash  . Lamisil [Terbinafine] Rash and Other (See Comments)    Pt states this causes her to "feel funny"  . Nsaids Other (See Comments)    Per MD's orders     Julitza Rickles Ander Slade

## 2020-04-19 NOTE — Progress Notes (Addendum)
PROGRESS NOTE    Michele Owens  EZM:[id number] DOB: 08/24/1960 DOA: 04/18/2020 PCP: Everrett Coombe, MD   Brief Narrative:  Michele Owens is a 60 y.o. female with medical history significant of OSA, Chronic diastolic HTN Chronic respiratory failure with hypoxia on 3 L of oxygen baseline, chronic pain, hypothyroidism, COPD,  gout  Presented with hemoptysis chest pain cough diaphoresis increased sputum production no associated fevers or chills No vomiting Patient recently was admitted today with elevated white blood cell count up to 32 possible community-acquired pneumonia chest x-ray showed atelectasis versus infiltrate She was treated with IV ceftriaxone and azithromycin and however cell count came down to 15 She finally was discharged on 4 January on Zithromax and Keflex to complete 5-day treatment She reports initially she was feeling better after her discharge above past few days her symptoms have came back normal hours.  She has been having production of large blood clots over past 24 hours when she coughs  Assessment & Plan:   Active Problems:   Tobacco abuse   OSA (obstructive sleep apnea)   Dyslipidemia associated with type 2 diabetes mellitus ([place])   Controlled type 2 diabetes mellitus without complication, without long-term current use of insulin (HCC)   Anxiety   Obesity hypoventilation syndrome ([place])   CAP (community acquired pneumonia)   Pulmonary hypertension ([place])   Chronic respiratory failure with hypoxia and hypercapnia (HCC)   HLD (hyperlipidemia)   Obesity   Morbid obesity with BMI of 50.0-59.9, adult (HCC)   Acute on chronic respiratory failure with hypoxia and hypercapnia (HCC)   Acute on chronic respiratory failure with hypoxia (HCC)   Hemoptysis   Acute metabolic encephalopathy   Acute encephalopathy   Chronic diastolic CHF (congestive heart failure) (HCC)   Sepsis (HCC)  Acute hypoxic and hypercapnic respiratory failure secondary to persistent  community-acquired pneumonia: Uses 2 L oxygen during the day and 3 L at night.  Currently on 4 L and saturating 95%.  Brought it down to 3 L while I was there with the nurse in the room today.  Continue cefepime and vancomycin and follow cultures and tailor antibiotics and de-escalate in next 1 to 2 days.  Patient feels much better.  Acute encephalopathy: Multifactorial but likely due to infection.  Currently resolved.  She is alert and oriented.  Hemoptysis - lilkely due to pneumonia.  Has not had any hemoptysis since being in the ED.  OSA (obstructive sleep apnea)/obesity hypoventilation syndrome-nocturnal oxygen, pt states she is possibly on CPAP at home but not confirmed on last note  Obesity: Weight loss counseling provided.  Hyperlipidemia: Continue statin.   type 2 diabetes mellitus (HCC) -hemoglobin A1c 6.2.  Takes Metformin at home which is on hold.  Blood sugar controlled.  Continue SSI.  Chronic pain: Patient request to resume medications.  Now that she is alert and oriented, I will resume home medications.  Hypothyroidism -continue Synthroid.  Chronic diastolic CHF -appears euvolemic.  Does not seem to be on any diuretics at home.  DVT prophylaxis: SCDs Start: 04/19/20 0700   Code Status: Full Code  Family Communication:  None present at bedside.  Plan of care discussed with patient in length and he verbalized understanding and agreed with it.  I then called patient's daughter over the phone.  I answered several questions.  She was asking what percentage of pneumonia her mother has.  I explained to her that pneumonia is not graded based on the percentage.  She was upset at that because  the previous doctor had told her the percentage of pneumonia when she was recently hospitalized.  Status is: Inpatient  Remains inpatient appropriate because:Inpatient level of care appropriate due to severity of illness   Dispo: The patient is from: Home              Anticipated d/c is  to: Home              Anticipated d/c date is: 1 day              Patient currently is not medically stable to d/c.        Estimated body mass index is 49.92 kg/m as calculated from the following:   Height as of 04/10/20: 5\' 5"  (1.651 m).   Weight as of 02/14/20: 136.1 kg.      Nutritional status:               Consultants:   PCCM  Procedures:   None  Antimicrobials:  Anti-infectives (From admission, onward)   Start     Dose/Rate Route Frequency Ordered Stop   04/19/20 2300  vancomycin (VANCOCIN) IVPB 1000 mg/200 mL premix        1,000 mg 200 mL/hr over 60 Minutes Intravenous Every 12 hours 04/19/20 0942     04/19/20 1000  vancomycin (VANCOCIN) IVPB 1000 mg/200 mL premix  Status:  Discontinued        1,000 mg 200 mL/hr over 60 Minutes Intravenous Every 12 hours 04/18/20 1936 04/19/20 0942   04/19/20 0945  vancomycin (VANCOREADY) IVPB 2000 mg/400 mL        2,000 mg 200 mL/hr over 120 Minutes Intravenous NOW 04/19/20 0942 04/20/20 0945   04/18/20 2315  doxycycline (VIBRAMYCIN) 100 mg in sodium chloride 0.9 % 250 mL IVPB        100 mg 125 mL/hr over 120 Minutes Intravenous Every 12 hours 04/18/20 2305     04/18/20 1945  vancomycin (VANCOREADY) IVPB 2000 mg/400 mL  Status:  Discontinued        2,000 mg 200 mL/hr over 120 Minutes Intravenous  Once 04/18/20 1935 04/19/20 0942   04/18/20 1600  ceFEPIme (MAXIPIME) 2 g in sodium chloride 0.9 % 100 mL IVPB        2 g 200 mL/hr over 30 Minutes Intravenous Every 8 hours 04/18/20 1546           Subjective: Seen and examined the ED.  She states that she feels much better.  No new complaint.  No hemoptysis.  Objective: Vitals:   04/19/20 0400 04/19/20 0530 04/19/20 0600 04/19/20 0834  BP: (!) 154/91 113/74 (!) 178/87 (!) 162/90  Pulse: 94 95 95 98  Resp: (!) 24 (!) 24 20 19   Temp:    98 F (36.7 C)  TempSrc:    Oral  SpO2: 96% 95% 95% 95%    Intake/Output Summary (Last 24 hours) at 04/19/2020 1017 Last  data filed at 04/19/2020 0940 [place] per 24 hour  Intake 100 ml  Output --  Net 100 ml   There were no vitals filed for this visit.  Examination:  General exam: Appears calm and comfortable, morbidly obese Respiratory system: Diminished breath sounds with faint rhonchi at the bases bilaterally. Respiratory effort normal. Cardiovascular system: S1 & S2 heard, RRR. No JVD, murmurs, rubs, gallops or clicks.  Trace pitting edema bilateral lower extremity Gastrointestinal system: Abdomen is nondistended, soft and nontender. No organomegaly or masses felt. Normal bowel sounds heard. Central nervous system: Alert  and oriented. No focal neurological deficits. Extremities: Symmetric 5 x 5 power. Skin: No rashes, lesions or ulcers Psychiatry: Judgement and insight appear normal. Mood & affect appropriate.    Data Reviewed: I have personally reviewed following labs and imaging studies  CBC: Recent Labs  Lab 04/18/20 0112 04/18/20 2054 04/19/20 0855  WBC 22.2*  --  15.3*  NEUTROABS  --   --  12.1*  HGB 11.6* 13.3 11.9*  HCT 38.4 39.0 38.2  MCV 95.0  --  95.0  PLT 295  --  240   Basic Metabolic Panel: Recent Labs  Lab 04/13/20 0303 04/18/20 0112 04/18/20 1918 04/18/20 2054 04/19/20 0855  NA 140 139  --  140 139  K 3.8 4.0  --  3.7 4.1  CL 94* 96*  --   --  98  CO2 35* 31  --   --  31  GLUCOSE 108* 123*  --   --  107*  BUN 8 13  --   --  5*  CREATININE 0.68 0.84  --   --  0.50  CALCIUM 8.6* 8.6*  --   --  8.8*  MG  --   --  2.2  --  2.3  PHOS  --   --   --   --  4.0   GFR: CrCl cannot be calculated (Unknown ideal weight.). Liver Function Tests: Recent Labs  Lab 04/19/20 0855  AST 17  ALT 16  ALKPHOS 68  BILITOT 0.5  PROT 7.4  ALBUMIN 3.5   No results for input(s): LIPASE, AMYLASE in the last 168 hours. Recent Labs  Lab 04/19/20 0231  AMMONIA 46*   Coagulation Profile: Recent Labs  Lab 04/18/20 1918  INR 1.0   Cardiac Enzymes: No results for input(s):  CKTOTAL, CKMB, CKMBINDEX, TROPONINI in the last 168 hours. BNP (last 3 results) No results for input(s): PROBNP in the last 8760 hours. HbA1C: Recent Labs    04/19/20 0231  HGBA1C 6.2*   CBG: Recent Labs  Lab 04/12/20 1958 04/13/20 0701 04/13/20 1136 04/19/20 0102 04/19/20 0839  GLUCAP 118* 138* 123* 179* 112*   Lipid Profile: No results for input(s): CHOL, HDL, LDLCALC, TRIG, CHOLHDL, LDLDIRECT in the last 72 hours. Thyroid Function Tests: Recent Labs    04/19/20 0231  TSH 1.236   Anemia Panel: No results for input(s): VITAMINB12, FOLATE, FERRITIN, TIBC, IRON, RETICCTPCT in the last 72 hours. Sepsis Labs: Recent Labs  Lab 04/13/20 0303 04/18/20 1303 04/18/20 1616 04/18/20 1918 04/19/20 0231  PROCALCITON <0.10  --   --  <0.10 <0.10  LATICACIDVEN  --  1.9 1.5  --   --     Recent Results (from the past 240 hour(s))  Resp Panel by RT-PCR (Flu A&B, Covid) Nasopharyngeal Swab     Status: None   Collection Time: 04/10/20  6:55 PM   Specimen: Nasopharyngeal Swab; Nasopharyngeal(NP) swabs in vial transport medium  Result Value Ref Range Status   SARS Coronavirus 2 by RT PCR NEGATIVE NEGATIVE Final    Comment: (NOTE) SARS-CoV-2 target nucleic acids are NOT DETECTED.  The SARS-CoV-2 RNA is generally detectable in upper respiratory specimens during the acute phase of infection. The lowest concentration of SARS-CoV-2 viral copies this assay can detect is 138 copies/mL. A negative result does not preclude SARS-Cov-2 infection and should not be used as the sole basis for treatment or other patient management decisions. A negative result may occur with  improper specimen collection/handling, submission of specimen other than nasopharyngeal  swab, presence of viral mutation(s) within the areas targeted by this assay, and inadequate number of viral copies(<138 copies/mL). A negative result must be combined with clinical observations, patient history, and  epidemiological information. The expected result is Negative.  Fact Sheet for Patients:  EntrepreneurPulse.com.au  Fact Sheet for Healthcare Providers:  IncredibleEmployment.be  This test is no t yet approved or cleared by the [place] FDA and  has been authorized for detection and/or diagnosis of SARS-CoV-2 by FDA under an Emergency Use Authorization (EUA). This EUA will remain  in effect (meaning this test can be used) for the duration of the COVID-19 declaration under Section 564(b)(1) of the Act, 21 U.S.C.section 360bbb-3(b)(1), unless the authorization is terminated  or revoked sooner.       Influenza A by PCR NEGATIVE NEGATIVE Final   Influenza B by PCR NEGATIVE NEGATIVE Final    Comment: (NOTE) The Xpert Xpress SARS-CoV-2/FLU/RSV plus assay is intended as an aid in the diagnosis of influenza from Nasopharyngeal swab specimens and should not be used as a sole basis for treatment. Nasal washings and aspirates are unacceptable for Xpert Xpress SARS-CoV-2/FLU/RSV testing.  Fact Sheet for Patients: EntrepreneurPulse.com.au  Fact Sheet for Healthcare Providers: IncredibleEmployment.be  This test is not yet approved or cleared by the [place] FDA and has been authorized for detection and/or diagnosis of SARS-CoV-2 by FDA under an Emergency Use Authorization (EUA). This EUA will remain in effect (meaning this test can be used) for the duration of the COVID-19 declaration under Section 564(b)(1) of the Act, 21 U.S.C. section 360bbb-3(b)(1), unless the authorization is terminated or revoked.  Performed at [facility], [place] [address], [place], [place] [address]   Urine culture     Status: Abnormal   Collection Time: 04/11/20 12:37 AM   Specimen: Urine, Random  Result Value Ref Range Status   Specimen Description URINE, RANDOM  Final   Special Requests NONE  Final   Culture (A)  Final     <10,000 COLONIES/mL INSIGNIFICANT GROWTH Performed at [facility], [place] [address], [place], [place] [address]    Report Status 04/12/2020 FINAL  Final  Resp Panel by RT-PCR (Flu A&B, Covid) Nasopharyngeal Swab     Status: None   Collection Time: 04/18/20 12:46 PM   Specimen: Nasopharyngeal Swab; Nasopharyngeal(NP) swabs in vial transport medium  Result Value Ref Range Status   SARS Coronavirus 2 by RT PCR NEGATIVE NEGATIVE Final    Comment: (NOTE) SARS-CoV-2 target nucleic acids are NOT DETECTED.  The SARS-CoV-2 RNA is generally detectable in upper respiratory specimens during the acute phase of infection. The lowest concentration of SARS-CoV-2 viral copies this assay can detect is 138 copies/mL. A negative result does not preclude SARS-Cov-2 infection and should not be used as the sole basis for treatment or other patient management decisions. A negative result may occur with  improper specimen collection/handling, submission of specimen other than nasopharyngeal swab, presence of viral mutation(s) within the areas targeted by this assay, and inadequate number of viral copies(<138 copies/mL). A negative result must be combined with clinical observations, patient history, and epidemiological information. The expected result is Negative.  Fact Sheet for Patients:  EntrepreneurPulse.com.au  Fact Sheet for Healthcare Providers:  IncredibleEmployment.be  This test is no t yet approved or cleared by the [place] FDA and  has been authorized for detection and/or diagnosis of SARS-CoV-2 by FDA under an Emergency Use Authorization (EUA). This EUA will remain  in effect (meaning this test  can be used) for the duration of the COVID-19 declaration under Section 564(b)(1) of the Act, 21 U.S.C.section 360bbb-3(b)(1), unless the authorization is terminated  or revoked sooner.       Influenza A by PCR NEGATIVE NEGATIVE Final   Influenza B  by PCR NEGATIVE NEGATIVE Final    Comment: (NOTE) The Xpert Xpress SARS-CoV-2/FLU/RSV plus assay is intended as an aid in the diagnosis of influenza from Nasopharyngeal swab specimens and should not be used as a sole basis for treatment. Nasal washings and aspirates are unacceptable for Xpert Xpress SARS-CoV-2/FLU/RSV testing.  Fact Sheet for Patients: EntrepreneurPulse.com.au  Fact Sheet for Healthcare Providers: IncredibleEmployment.be  This test is not yet approved or cleared by the [place] FDA and has been authorized for detection and/or diagnosis of SARS-CoV-2 by FDA under an Emergency Use Authorization (EUA). This EUA will remain in effect (meaning this test can be used) for the duration of the COVID-19 declaration under Section 564(b)(1) of the Act, 21 U.S.C. section 360bbb-3(b)(1), unless the authorization is terminated or revoked.  Performed at [facility], [place] [address] [place], [place] [address]   Blood culture (routine x 2)     Status: None (Preliminary result)   Collection Time: 04/18/20 12:50 PM   Specimen: BLOOD  Result Value Ref Range Status   Specimen Description BLOOD LEFT ANTECUBITAL  Final   Special Requests   Final    BOTTLES DRAWN AEROBIC AND ANAEROBIC Blood Culture adequate volume   Culture   Final    NO GROWTH < 24 HOURS Performed at [facility], [place] [address] [place], [place] [address]    Report Status PENDING  Incomplete      Radiology Studies: DG Chest 1 View  Result Date: 04/18/2020 CLINICAL DATA:  Recent pneumonia EXAM: CHEST  1 VIEW COMPARISON:  Radiograph 04/10/2020 CT abdomen pelvis 04/10/2020 for comparison the lung bases, radiograph 02/15/2020 FINDINGS: Persistent mixed patchy and linear opacities in the lung bases, some of which is likely reflect a combination of scarring and atelectasis though residual infection/airspace disease is not fully excluded. No new consolidative opacities  or convincing features of edema. No pneumothorax or visible effusion. Stable cardiomediastinal contours with a calcified aorta. No acute osseous or soft tissue abnormality. Degenerative changes are present in the imaged spine and shoulders. IMPRESSION: Persistent mixed patchy and linear opacities in the lung bases, some of which is likely reflect a combination of scarring and atelectasis though residual infection/airspace disease is not fully excluded. Electronically Signed   By: Lovena Le M.D.   On: 04/18/2020 01:29   CT Head Wo Contrast  Result Date: 04/18/2020 CLINICAL DATA:  Mental status change, unknown cause altered mental EXAM: CT HEAD WITHOUT CONTRAST TECHNIQUE: Contiguous axial images were obtained from the base of the skull through the vertex without intravenous contrast. COMPARISON:  Head CT 06/28/2019 FINDINGS: Brain: Patient is rotated in the scanner, cannot lay flat for imaging. No intracranial hemorrhage, mass effect, or midline shift. No hydrocephalus. The basilar cisterns are patent. No evidence of territorial infarct or acute ischemia. No extra-axial or intracranial fluid collection. Vascular: Atherosclerosis of skullbase vasculature without hyperdense vessel or abnormal calcification. Skull: No fracture or focal lesion.  Mild frontal hyperostosis. Sinuses/Orbits: Paranasal sinuses and mastoid air cells are clear. The visualized orbits are unremarkable. Other: None. IMPRESSION: No acute intracranial abnormality. Electronically Signed   By: Keith Rake M.D.   On: 04/18/2020 22:35   CT Angio Chest PE W and/or Wo Contrast  Result Date: 04/18/2020 CLINICAL DATA:  Hemoptysis.  Recent admission for pneumonia. EXAM: CT ANGIOGRAPHY CHEST WITH CONTRAST TECHNIQUE: Multidetector CT imaging of the chest was performed using the standard protocol during bolus administration of intravenous contrast. Multiplanar CT image reconstructions and MIPs were obtained to evaluate the vascular anatomy.  CONTRAST:  27mL OMNIPAQUE IOHEXOL 350 MG/ML SOLN COMPARISON:  Chest CT angiogram dated 06/28/2019. FINDINGS: Cardiovascular: Majority of the peripheral segmental and subsegmental pulmonary branches to the lower lobes cannot be definitively characterized due to patient breathing motion artifact, however, there is no pulmonary embolism identified within the main, lobar or central segmental pulmonary artery branches bilaterally. Cannot exclude small nonocclusive pulmonary emboli. No thoracic aortic aneurysm or evidence of aortic dissection. No pericardial effusion. Mild aortic atherosclerosis. Mediastinum/Nodes: No mass or enlarged lymph nodes are seen within the mediastinum or perihilar regions. Esophagus is unremarkable. Trachea appears normal. Lungs/Pleura: Patchy bibasilar consolidations, atelectasis versus pneumonia. Upper lungs are clear. No pleural effusion or pneumothorax. Upper Abdomen: Limited images of the upper abdomen are unremarkable. Musculoskeletal: Mild degenerative spondylosis of the slightly scoliotic thoracic spine. No acute appearing osseous abnormality. Review of the MIP images confirms the above findings. IMPRESSION: 1. No central obstructing pulmonary embolism is identified. However, due to patient breathing motion artifact, small nonocclusive pulmonary emboli or peripheral pulmonary emboli cannot be confidently excluded. If symptoms persist or worsen, consider repeat chest CT angiogram if patient can perform adequate breath hold. 2. Patchy bibasilar consolidations, atelectasis versus pneumonia. Aortic Atherosclerosis (ICD10-I70.0). Electronically Signed   By: Franki Cabot M.D.   On: 04/18/2020 08:56    Scheduled Meds: . allopurinol  100 mg Oral Daily  . calcitRIOL  0.5 mcg Oral Daily  . carvedilol  6.25 mg Oral BID WC  . clonazePAM  2 mg Oral TID  . cyclobenzaprine  10 mg Oral TID  . ferrous sulfate  325 mg Oral Daily  . insulin aspart  0-9 Units Subcutaneous Q4H  .  ipratropium-albuterol  3 mL Nebulization QID  . levothyroxine  224 mcg Oral Q0600  . lisinopril  5 mg Oral Daily  . pantoprazole  40 mg Oral Daily  . rosuvastatin  10 mg Oral QPM  . traZODone  100 mg Oral QHS  . umeclidinium-vilanterol  1 puff Inhalation Daily   Continuous Infusions: . ceFEPime (MAXIPIME) IV Stopped (04/19/20 0940)  . doxycycline (VIBRAMYCIN) IV Stopped (04/19/20 0612)  . vancomycin    . vancomycin       LOS: 1 day   Time spent: 35 minutes   [name], MD [facility]  04/19/2020, 10:17 AM   To contact the attending provider between 7A-7P or the covering provider during after hours 7P-7A, please log into the web site [email or web address].

## 2020-04-19 NOTE — ED Notes (Signed)
Non productive cough

## 2020-04-20 ENCOUNTER — Other Ambulatory Visit (HOSPITAL_COMMUNITY): Payer: Self-pay | Admitting: Family Medicine

## 2020-04-20 ENCOUNTER — Encounter (HOSPITAL_COMMUNITY): Payer: Self-pay | Admitting: Internal Medicine

## 2020-04-20 ENCOUNTER — Other Ambulatory Visit: Payer: Self-pay

## 2020-04-20 DIAGNOSIS — G9340 Encephalopathy, unspecified: Secondary | ICD-10-CM | POA: Diagnosis not present

## 2020-04-20 DIAGNOSIS — G9341 Metabolic encephalopathy: Secondary | ICD-10-CM | POA: Diagnosis not present

## 2020-04-20 DIAGNOSIS — J9621 Acute and chronic respiratory failure with hypoxia: Secondary | ICD-10-CM | POA: Diagnosis not present

## 2020-04-20 DIAGNOSIS — J189 Pneumonia, unspecified organism: Secondary | ICD-10-CM

## 2020-04-20 LAB — BASIC METABOLIC PANEL
Anion gap: 10 (ref 5–15)
BUN: 7 mg/dL (ref 6–20)
CO2: 30 mmol/L (ref 22–32)
Calcium: 8.8 mg/dL — ABNORMAL LOW (ref 8.9–10.3)
Chloride: 99 mmol/L (ref 98–111)
Creatinine, Ser: 0.61 mg/dL (ref 0.44–1.00)
GFR, Estimated: >60 mL/min (ref >60)
Glucose, Bld: 137 mg/dL — ABNORMAL HIGH (ref 70–99)
Potassium: 4.2 mmol/L (ref 3.5–5.1)
Sodium: 139 mmol/L (ref 135–145)

## 2020-04-20 LAB — BLOOD GAS, VENOUS
Acid-Base Excess: 5.2 mmol/L — ABNORMAL HIGH (ref 0.0–2.0)
Bicarbonate: 31.4 mmol/L — ABNORMAL HIGH (ref 20.0–28.0)
Drawn by: 2102
O2 Saturation: 63 %
Patient temperature: 37
pCO2, Ven: 67 mmHg — ABNORMAL HIGH (ref 44.0–60.0)
pH, Ven: 7.292 (ref 7.250–7.430)
pO2, Ven: 34.6 mmHg (ref 32.0–45.0)

## 2020-04-20 LAB — CBC WITH DIFFERENTIAL/PLATELET
Abs Immature Granulocytes: 0.21 10*3/uL — ABNORMAL HIGH (ref 0.00–0.07)
Basophils Absolute: 0 10*3/uL (ref 0.0–0.1)
Basophils Relative: 0 %
Eosinophils Absolute: 0.4 10*3/uL (ref 0.0–0.5)
Eosinophils Relative: 3 %
HCT: 37.1 % (ref 36.0–46.0)
Hemoglobin: 11.4 g/dL — ABNORMAL LOW (ref 12.0–15.0)
Immature Granulocytes: 1 %
Lymphocytes Relative: 13 %
Lymphs Abs: 1.9 10*3/uL (ref 0.7–4.0)
MCH: 29.5 pg (ref 26.0–34.0)
MCHC: 30.7 g/dL (ref 30.0–36.0)
MCV: 96.1 fL (ref 80.0–100.0)
Monocytes Absolute: 0.6 10*3/uL (ref 0.1–1.0)
Monocytes Relative: 4 %
Neutro Abs: 11.5 10*3/uL — ABNORMAL HIGH (ref 1.7–7.7)
Neutrophils Relative %: 79 %
Platelets: 276 10*3/uL (ref 150–400)
RBC: 3.86 MIL/uL — ABNORMAL LOW (ref 3.87–5.11)
RDW: 13.9 % (ref 11.5–15.5)
WBC: 14.7 10*3/uL — ABNORMAL HIGH (ref 4.0–10.5)
nRBC: 0 % (ref 0.0–0.2)

## 2020-04-20 LAB — GLUCOSE, CAPILLARY
Glucose-Capillary: 110 mg/dL — ABNORMAL HIGH (ref 70–99)
Glucose-Capillary: 124 mg/dL — ABNORMAL HIGH (ref 70–99)
Glucose-Capillary: 94 mg/dL (ref 70–99)

## 2020-04-20 LAB — PROCALCITONIN: Procalcitonin: <0.1 ng/mL

## 2020-04-20 LAB — MAGNESIUM: Magnesium: 2.1 mg/dL (ref 1.7–2.4)

## 2020-04-20 MED ORDER — DOXYCYCLINE HYCLATE 100 MG PO TABS: 100.0000 mg | ORAL_TABLET | Freq: Two times a day (BID) | ORAL | Status: DC

## 2020-04-20 MED ORDER — IPRATROPIUM-ALBUTEROL 0.5-2.5 (3) MG/3ML IN SOLN
3.0000 mL | Freq: Two times a day (BID) | RESPIRATORY_TRACT | Status: DC
  Administered 2020-04-20: 3 mL via RESPIRATORY_TRACT
  Filled 2020-04-20: qty 3

## 2020-04-20 MED ORDER — CEFDINIR 300 MG PO CAPS: 300.0000 mg | ORAL_CAPSULE | Freq: Two times a day (BID) | ORAL | 0 refills | Status: DC

## 2020-04-20 MED FILL — CEFDINIR 300 MG CAPSULE: 300 | 4 days supply | Qty: 8 | Fill #0

## 2020-04-20 NOTE — Discharge Summary (Addendum)
Physician Discharge Summary  Michele Owens BDZ:[id number] DOB: [birth year] DOA: 04/18/2020  PCP: Michele Coombe, MD  Admit date: 04/18/2020 Discharge date: 04/20/2020 30 Day Unplanned Readmission Risk Score   Flowsheet Row ED to Hosp-Admission (Current) from 04/18/2020 in [facility] [facility]  30 Day Unplanned Readmission Risk Score (%) 30.33 Filed at 04/20/2020 0801     This score is the patient's risk of an unplanned readmission within 30 days of being discharged (0 -100%). The score is based on dignosis, age, lab data, medications, orders, and past utilization.   Low:  0-14.9   Medium: 15-21.9   High: 22-29.9   Extreme: 30 and above         Admitted From: Home Disposition: Home  Recommendations for Outpatient Follow-up:  1. Follow up with PCP in 1-2 weeks 2. Please obtain BMP/CBC in one week 3. Please follow up with your PCP on the following pending results: Unresulted Labs (From admission, onward)          Start     Ordered   04/18/20 2235  Culture, Urine  Once,   STAT        04/18/20 2235   04/18/20 1918  Culture, sputum-assessment  Once,   R        04/18/20 1919   04/18/20 1918  Legionella Pneumophila Serogp 1 Ur Ag  Once,   STAT        04/18/20 1919   04/18/20 1918  Strep pneumoniae urinary antigen  Once,   STAT        04/18/20 1919   04/18/20 1231  Blood culture (routine x 2)  BLOOD CULTURE X 2,   STAT      04/18/20 1230            Home Health: None Equipment/Devices: None  Discharge Condition: Stable CODE STATUS: Full code Diet recommendation: Cardiac  Subjective: Seen and examined.  Feels better and fully back to baseline.  No shortness of breath or any other complaint.  Wants to go home  Brief/Interim Summary: Michele Owens a 60 y.o.femalewith medical history significant of OSA, Chronic diastolic HTN, Chronic respiratory failure with hypoxia on 2-3 L of oxygen baseline, chronic pain, hypothyroidism, COPD, gout Presented withhemoptysis,  chest pain, cough, diaphoresis, increased sputum production no associated fevers or chills.   Patient was recently admitted from 04/10/2020 through 04/13/2020 for community-acquired pneumonia.  He was treated with antibiotics and sent home on oral antibiotics.  This time, patient was noted to be requiring 5 L of oxygen and repeat chest x-ray showed persistent infiltrates.  CT angiogram of the chest was done and due to motion artifact, small PE was unable to be ruled out but large PE was ruled out.  She was not started on any anticoagulant.  Patient was once again started on antibiotics.  Since admission which has been about 48 hours now, patient has not had any hemoptysis at all and patient in fact has improved significantly to the point that she is fully talkative and she believes that she is back to her baseline and does not have any shortness of breath and is currently requiring only 2 L of oxygen and is able to speak in full sentences and wants to go home.  Upon presentation, she had a leukocytosis of 22 and now it is down to 14.  She has remained afebrile.  She was also seen by PCCM.  At this point in time, there is no other concern.  I have  also discussed with her daughter over the speaker phone while I was in patient's room.  Daughter is also agreeable with the discharge plan.  She is being discharged with 4 days of oral cefdinir.  Of note, she also came in with acute encephalopathy which is likely secondary to her infection.  Encephalopathy resolved within few hours after admission.  She is fully alert and oriented.  Discharge Diagnoses:  Active Problems:   Tobacco abuse   OSA (obstructive sleep apnea)   Dyslipidemia associated with type 2 diabetes mellitus ([place])   Controlled type 2 diabetes mellitus without complication, without long-term current use of insulin (HCC)   Anxiety   Obesity hypoventilation syndrome ([place])   CAP (community acquired pneumonia)   Pulmonary hypertension ([place])   Chronic  respiratory failure with hypoxia and hypercapnia (HCC)   HLD (hyperlipidemia)   Obesity   Morbid obesity with BMI of 50.0-59.9, adult (HCC)   Acute on chronic respiratory failure with hypoxia and hypercapnia (HCC)   Acute on chronic respiratory failure with hypoxia (HCC)   Hemoptysis   Acute metabolic encephalopathy   Acute encephalopathy   Chronic diastolic CHF (congestive heart failure) ([place])   Sepsis ([place])    Discharge Instructions   Allergies as of 04/20/2020      Reactions   Bee Venom Swelling, Other (See Comments)   "All over my body" (swelling)   Fioricet [butalbital-apap-caffeine] Nausea And Vomiting, Rash   Ibuprofen Rash, Other (See Comments)   Severe rash   Lamisil [terbinafine] Rash, Other (See Comments)   Pt states this causes her to "feel funny"   Nsaids Other (See Comments)   Per MD's orders       Medication List    STOP taking these medications   azithromycin 500 MG tablet Commonly known as: ZITHROMAX   cephALEXin 500 MG capsule Commonly known as: KEFLEX     TAKE these medications   Accu-Chek Aviva Plus w/Device Kit 1 each by Other route daily.   albuterol 108 (90 Base) MCG/ACT inhaler Commonly known as: VENTOLIN HFA Inhale 1-2 puffs into the lungs every 6 (six) hours as needed for wheezing or shortness of breath.   allopurinol 100 MG tablet Commonly known as: ZYLOPRIM Take 100 mg by mouth daily.   Anoro Ellipta 62.5-25 MCG/INH Aepb Generic drug: umeclidinium-vilanterol Inhale 1 puff into the lungs daily.   benzonatate 200 MG capsule Commonly known as: TESSALON Take 1 capsule (200 mg total) by mouth in the morning, at noon, and at bedtime. 7 DS   calcitRIOL 0.5 MCG capsule Commonly known as: ROCALTROL Take 1 capsule (0.5 mcg total) by mouth daily.   carvedilol 6.25 MG tablet Commonly known as: COREG Take 1 tablet (6.25 mg total) by mouth 2 (two) times daily with a meal.   cefdinir 300 MG capsule Commonly known as: OMNICEF Take 1  capsule (300 mg total) by mouth 2 (two) times daily for 4 days.   clonazePAM 2 MG tablet Commonly known as: KLONOPIN Take 1 tablet (2 mg total) by mouth 3 (three) times daily. for anxiety   cyclobenzaprine 10 MG tablet Commonly known as: FLEXERIL Take 10 mg by mouth 3 (three) times daily.   diclofenac sodium 1 % Gel Commonly known as: VOLTAREN Apply 2 g topically 4 (four) times daily as needed (for pain).   EPINEPHrine 0.3 mg/0.3 mL Soaj injection Commonly known as: EPI-PEN Inject 0.3 mg into the muscle daily as needed (allergic reaction).   FeroSul 325 (65 FE) MG tablet Generic drug:  ferrous sulfate Take 325 mg by mouth daily.   glucose blood test strip Use as instructed to check blood sugar up to 4 times daily What changed:   how much to take  how to take this  when to take this   guaiFENesin 600 MG 12 hr tablet Commonly known as: MUCINEX Take 2 tablets (1,200 mg total) by mouth 2 (two) times daily.   ipratropium-albuterol 0.5-2.5 (3) MG/3ML Soln Commonly known as: DUONEB Take 3 mLs by nebulization 4 (four) times daily.   levothyroxine 112 MCG tablet Commonly known as: SYNTHROID Take 224 mcg by mouth daily.   lisinopril 5 MG tablet Commonly known as: ZESTRIL Take 1 tablet (5 mg total) by mouth daily. Please call for office visit [phone]   loperamide 2 MG capsule Commonly known as: IMODIUM Take 1 capsule (2 mg total) by mouth 4 (four) times daily as needed for diarrhea or loose stools.   metFORMIN 500 MG tablet Commonly known as: Glucophage Take 1 tablet (500 mg total) by mouth 2 (two) times daily with a meal.   nitroGLYCERIN 0.4 MG SL tablet Commonly known as: NITROSTAT Place 1 tablet (0.4 mg total) under the tongue every 5 (five) minutes as needed for chest pain.   nystatin cream Commonly known as: MYCOSTATIN Apply 1 application topically 2 (two) times daily as needed for dry skin.   omeprazole 20 MG capsule Commonly known as: PRILOSEC Take 20  mg by mouth 2 (two) times daily before a meal.   oxyCODONE-acetaminophen 10-325 MG tablet Commonly known as: PERCOCET Take 1 tablet by mouth every 6 (six) hours as needed for pain.   OXYGEN Inhale 2-3 L into the lungs continuous.   potassium chloride SA 20 MEQ tablet Commonly known as: KLOR-CON Take 1 tablet (20 mEq total) by mouth 2 (two) times daily.   QC Stool Softener Pls Laxative 8.6-50 MG tablet Generic drug: senna-docusate Take 1 tablet by mouth at bedtime.   rosuvastatin 10 MG tablet Commonly known as: CRESTOR Take 10 mg by mouth every evening.   torsemide 20 MG tablet Commonly known as: DEMADEX TAKE 4 (FOUR) TABLETS BY MOUTH DAILY (2AM+ 2NOON) What changed: See the new instructions.   traZODone 50 MG tablet Commonly known as: DESYREL Take 100 mg by mouth at bedtime.       Follow-up Information    Michele Coombe, MD Follow up in 1 week(s).   Specialty: Internal Medicine Contact information: [address] [place] [address] [phone]              Allergies  Allergen Reactions  . Bee Venom Swelling and Other (See Comments)    "All over my body" (swelling)  . Fioricet [Butalbital-Apap-Caffeine] Nausea And Vomiting and Rash  . Ibuprofen Rash and Other (See Comments)    Severe rash  . Lamisil [Terbinafine] Rash and Other (See Comments)    Pt states this causes her to "feel funny"  . Nsaids Other (See Comments)    Per MD's orders     Consultations: PCCM   Procedures/Studies: DG Chest 1 View  Result Date: 04/18/2020 CLINICAL DATA:  Recent pneumonia EXAM: CHEST  1 VIEW COMPARISON:  Radiograph 04/10/2020 CT abdomen pelvis 04/10/2020 for comparison the lung bases, radiograph 02/15/2020 FINDINGS: Persistent mixed patchy and linear opacities in the lung bases, some of which is likely reflect a combination of scarring and atelectasis though residual infection/airspace disease is not fully excluded. No new consolidative opacities  or convincing features of edema.  No pneumothorax or visible effusion. Stable cardiomediastinal contours with a calcified aorta. No acute osseous or soft tissue abnormality. Degenerative changes are present in the imaged spine and shoulders. IMPRESSION: Persistent mixed patchy and linear opacities in the lung bases, some of which is likely reflect a combination of scarring and atelectasis though residual infection/airspace disease is not fully excluded. Electronically Signed   By: Lovena Le M.D.   On: 04/18/2020 01:29   DG Chest 1 View  Result Date: 04/10/2020 CLINICAL DATA:  Cough EXAM: CHEST  1 VIEW COMPARISON:  02/14/2020 FINDINGS: Shallow inspiration with infiltration or atelectasis in the lung bases. No pleural effusions. No pneumothorax. Degenerative changes in the spine. IMPRESSION: Shallow inspiration with infiltration or atelectasis in the lung bases. Electronically Signed   By: Lucienne Capers M.D.   On: 04/10/2020 20:29   CT Head Wo Contrast  Result Date: 04/18/2020 CLINICAL DATA:  Mental status change, unknown cause altered mental EXAM: CT HEAD WITHOUT CONTRAST TECHNIQUE: Contiguous axial images were obtained from the base of the skull through the vertex without intravenous contrast. COMPARISON:  Head CT 06/28/2019 FINDINGS: Brain: Patient is rotated in the scanner, cannot lay flat for imaging. No intracranial hemorrhage, mass effect, or midline shift. No hydrocephalus. The basilar cisterns are patent. No evidence of territorial infarct or acute ischemia. No extra-axial or intracranial fluid collection. Vascular: Atherosclerosis of skullbase vasculature without hyperdense vessel or abnormal calcification. Skull: No fracture or focal lesion.  Mild frontal hyperostosis. Sinuses/Orbits: Paranasal sinuses and mastoid air cells are clear. The visualized orbits are unremarkable. Other: None. IMPRESSION: No acute intracranial abnormality. Electronically Signed   By: Keith Rake M.D.   On:  04/18/2020 22:35   CT Angio Chest PE W and/or Wo Contrast  Result Date: 04/18/2020 CLINICAL DATA:  Hemoptysis.  Recent admission for pneumonia. EXAM: CT ANGIOGRAPHY CHEST WITH CONTRAST TECHNIQUE: Multidetector CT imaging of the chest was performed using the standard protocol during bolus administration of intravenous contrast. Multiplanar CT image reconstructions and MIPs were obtained to evaluate the vascular anatomy. CONTRAST:  89m OMNIPAQUE IOHEXOL 350 MG/ML SOLN COMPARISON:  Chest CT angiogram dated 06/28/2019. FINDINGS: Cardiovascular: Majority of the peripheral segmental and subsegmental pulmonary branches to the lower lobes cannot be definitively characterized due to patient breathing motion artifact, however, there is no pulmonary embolism identified within the main, lobar or central segmental pulmonary artery branches bilaterally. Cannot exclude small nonocclusive pulmonary emboli. No thoracic aortic aneurysm or evidence of aortic dissection. No pericardial effusion. Mild aortic atherosclerosis. Mediastinum/Nodes: No mass or enlarged lymph nodes are seen within the mediastinum or perihilar regions. Esophagus is unremarkable. Trachea appears normal. Lungs/Pleura: Patchy bibasilar consolidations, atelectasis versus pneumonia. Upper lungs are clear. No pleural effusion or pneumothorax. Upper Abdomen: Limited images of the upper abdomen are unremarkable. Musculoskeletal: Mild degenerative spondylosis of the slightly scoliotic thoracic spine. No acute appearing osseous abnormality. Review of the MIP images confirms the above findings. IMPRESSION: 1. No central obstructing pulmonary embolism is identified. However, due to patient breathing motion artifact, small nonocclusive pulmonary emboli or peripheral pulmonary emboli cannot be confidently excluded. If symptoms persist or worsen, consider repeat chest CT angiogram if patient can perform adequate breath hold. 2. Patchy bibasilar consolidations, atelectasis  versus pneumonia. Aortic Atherosclerosis (ICD10-I70.0). Electronically Signed   By: SFranki CabotM.D.   On: 04/18/2020 08:56   CT ABDOMEN PELVIS W CONTRAST  Result Date: 04/10/2020 CLINICAL DATA:  Abdominal pain, fever, nausea, vomiting EXAM: CT ABDOMEN AND PELVIS WITH CONTRAST TECHNIQUE: Multidetector CT imaging  of the abdomen and pelvis was performed using the standard protocol following bolus administration of intravenous contrast. CONTRAST:  164m OMNIPAQUE IOHEXOL 300 MG/ML  SOLN COMPARISON:  None. FINDINGS: Lower chest: Bibasilar atelectasis. The visualized heart and pericardium are unremarkable. Hepatobiliary: Moderate hepatic steatosis. Mild hepatomegaly. No focal enhancing liver lesion. No intra or extrahepatic biliary ductal dilation. Gallbladder unremarkable. Pancreas: Unremarkable Spleen: Unremarkable Adrenals/Urinary Tract: Adrenal glands are unremarkable. Kidneys are normal, without renal calculi, focal lesion, or hydronephrosis. Bladder is unremarkable. Stomach/Bowel: Stomach is within normal limits. Appendix appears normal. No evidence of bowel wall thickening, distention, or inflammatory changes. No free intraperitoneal gas or fluid. Vascular/Lymphatic: No significant vascular findings are present. No enlarged abdominal or pelvic lymph nodes. Reproductive: Status post hysterectomy. No adnexal masses. Other: Rectum unremarkable.  No abdominal wall hernia. Musculoskeletal: No acute bone abnormality. No lytic or blastic bone lesion. IMPRESSION: No acute intra-abdominal pathology identified. No radiographic explanation for the patient's reported symptoms. Moderate hepatic steatosis. Electronically Signed   By: AFidela SalisburyMD   On: 04/10/2020 23:52      Discharge Exam: Vitals:   04/20/20 0819 04/20/20 0848  BP: (!) 102/49   Pulse: 89   Resp: 19   Temp: 98.3 F (36.8 C)   SpO2: 99% 94%   Vitals:   04/20/20 0316 04/20/20 0500 04/20/20 0819 04/20/20 0848  BP: (!) 147/76  (!) 102/49    Pulse: 94  89   Resp: 14  19   Temp: 98.2 F (36.8 C)  98.3 F (36.8 C)   TempSrc: Oral  Oral   SpO2:   99% 94%  Weight:  (!) 146 kg    Height:        General: Pt is alert, awake, not in acute distress, morbidly obese Cardiovascular: RRR, S1/S2 +, no rubs, no gallops Respiratory: Faint rhonchi bilaterally, no wheezing, no rhonchi Abdominal: Soft, NT, ND, bowel sounds + Extremities: Trace pitting edema bilateral lower extremity, no cyanosis    The results of significant diagnostics from this hospitalization (including imaging, microbiology, ancillary and laboratory) are listed below for reference.     Microbiology: Recent Results (from the past 240 hour(s))  Resp Panel by RT-PCR (Flu A&B, Covid) Nasopharyngeal Swab     Status: None   Collection Time: 04/10/20  6:55 PM   Specimen: Nasopharyngeal Swab; Nasopharyngeal(NP) swabs in vial transport medium  Result Value Ref Range Status   SARS Coronavirus 2 by RT PCR NEGATIVE NEGATIVE Final    Comment: (NOTE) SARS-CoV-2 target nucleic acids are NOT DETECTED.  The SARS-CoV-2 RNA is generally detectable in upper respiratory specimens during the acute phase of infection. The lowest concentration of SARS-CoV-2 viral copies this assay can detect is 138 copies/mL. A negative result does not preclude SARS-Cov-2 infection and should not be used as the sole basis for treatment or other patient management decisions. A negative result may occur with  improper specimen collection/handling, submission of specimen other than nasopharyngeal swab, presence of viral mutation(s) within the areas targeted by this assay, and inadequate number of viral copies(<138 copies/mL). A negative result must be combined with clinical observations, patient history, and epidemiological information. The expected result is Negative.  Fact Sheet for Patients:  hEntrepreneurPulse.com.au Fact Sheet for Healthcare Providers:   hIncredibleEmployment.be This test is no t yet approved or cleared by the [place] and  has been authorized for detection and/or diagnosis of SARS-CoV-2 by FDA under an Emergency Use Authorization (EUA). This EUA will remain  in effect (  meaning this test can be used) for the duration of the COVID-19 declaration under Section 564(b)(1) of the Act, 21 U.S.C.section 360bbb-3(b)(1), unless the authorization is terminated  or revoked sooner.       Influenza A by PCR NEGATIVE NEGATIVE Final   Influenza B by PCR NEGATIVE NEGATIVE Final    Comment: (NOTE) The Xpert Xpress SARS-CoV-2/FLU/RSV plus assay is intended as an aid in the diagnosis of influenza from Nasopharyngeal swab specimens and should not be used as a sole basis for treatment. Nasal washings and aspirates are unacceptable for Xpert Xpress SARS-CoV-2/FLU/RSV testing.  Fact Sheet for Patients: EntrepreneurPulse.com.au  Fact Sheet for Healthcare Providers: IncredibleEmployment.be  This test is not yet approved or cleared by the [place] FDA and has been authorized for detection and/or diagnosis of SARS-CoV-2 by FDA under an Emergency Use Authorization (EUA). This EUA will remain in effect (meaning this test can be used) for the duration of the COVID-19 declaration under Section 564(b)(1) of the Act, 21 U.S.C. section 360bbb-3(b)(1), unless the authorization is terminated or revoked.  Performed at [facility], [place] [address], [place], [place] [address]   Urine culture     Status: Abnormal   Collection Time: 04/11/20 12:37 AM   Specimen: Urine, Random  Result Value Ref Range Status   Specimen Description URINE, RANDOM  Final   Special Requests NONE  Final   Culture (A)  Final    <10,000 COLONIES/mL INSIGNIFICANT GROWTH Performed at [facility], [place] [address] [place], [place] [address]    Report Status 04/12/2020 FINAL  Final  Resp  Panel by RT-PCR (Flu A&B, Covid) Nasopharyngeal Swab     Status: None   Collection Time: 04/18/20 12:46 PM   Specimen: Nasopharyngeal Swab; Nasopharyngeal(NP) swabs in vial transport medium  Result Value Ref Range Status   SARS Coronavirus 2 by RT PCR NEGATIVE NEGATIVE Final    Comment: (NOTE) SARS-CoV-2 target nucleic acids are NOT DETECTED.  The SARS-CoV-2 RNA is generally detectable in upper respiratory specimens during the acute phase of infection. The lowest concentration of SARS-CoV-2 viral copies this assay can detect is 138 copies/mL. A negative result does not preclude SARS-Cov-2 infection and should not be used as the sole basis for treatment or other patient management decisions. A negative result may occur with  improper specimen collection/handling, submission of specimen other than nasopharyngeal swab, presence of viral mutation(s) within the areas targeted by this assay, and inadequate number of viral copies(<138 copies/mL). A negative result must be combined with clinical observations, patient history, and epidemiological information. The expected result is Negative.  Fact Sheet for Patients:  EntrepreneurPulse.com.au  Fact Sheet for Healthcare Providers:  IncredibleEmployment.be  This test is no t yet approved or cleared by the [place] FDA and  has been authorized for detection and/or diagnosis of SARS-CoV-2 by FDA under an Emergency Use Authorization (EUA). This EUA will remain  in effect (meaning this test can be used) for the duration of the COVID-19 declaration under Section 564(b)(1) of the Act, 21 U.S.C.section 360bbb-3(b)(1), unless the authorization is terminated  or revoked sooner.       Influenza A by PCR NEGATIVE NEGATIVE Final   Influenza B by PCR NEGATIVE NEGATIVE Final    Comment: (NOTE) The Xpert Xpress SARS-CoV-2/FLU/RSV plus assay is intended as an aid in the diagnosis of influenza from Nasopharyngeal  swab specimens and should not be used as a sole basis for treatment. Nasal washings and aspirates are unacceptable for Xpert  Xpress SARS-CoV-2/FLU/RSV testing.  Fact Sheet for Patients: EntrepreneurPulse.com.au  Fact Sheet for Healthcare Providers: IncredibleEmployment.be  This test is not yet approved or cleared by the [place] FDA and has been authorized for detection and/or diagnosis of SARS-CoV-2 by FDA under an Emergency Use Authorization (EUA). This EUA will remain in effect (meaning this test can be used) for the duration of the COVID-19 declaration under Section 564(b)(1) of the Act, 21 U.S.C. section 360bbb-3(b)(1), unless the authorization is terminated or revoked.  Performed at [facility], [place] [address], [place], [place] [address]   Blood culture (routine x 2)     Status: None (Preliminary result)   Collection Time: 04/18/20 12:50 PM   Specimen: BLOOD  Result Value Ref Range Status   Specimen Description BLOOD LEFT ANTECUBITAL  Final   Special Requests   Final    BOTTLES DRAWN AEROBIC AND ANAEROBIC Blood Culture adequate volume   Culture   Final    NO GROWTH 2 DAYS Performed at [facility], [place] [address] [place], [place] [address]    Report Status PENDING  Incomplete  MRSA PCR Screening     Status: None   Collection Time: 04/19/20 10:37 AM   Specimen: Nasopharyngeal  Result Value Ref Range Status   MRSA by PCR NEGATIVE NEGATIVE Final    Comment:        The GeneXpert MRSA Assay (FDA approved for NASAL specimens only), is one component of a comprehensive MRSA colonization surveillance program. It is not intended to diagnose MRSA infection nor to guide or monitor treatment for MRSA infections. Performed at [facility], [place] [address], [place], [place] [address]      Labs: BNP (last 3 results) Recent Labs    09/30/19 1412 02/15/20 0827 04/18/20 1301  BNP 39.1 30.2 94.7   Basic  Metabolic Panel: Recent Labs  Lab 04/18/20 0112 04/18/20 1918 04/18/20 2054 04/19/20 0855 04/20/20 0338  NA 139  --  140 139 139  K 4.0  --  3.7 4.1 4.2  CL 96*  --   --  98 99  CO2 31  --   --  31 30  GLUCOSE 123*  --   --  107* 137*  BUN 13  --   --  5* 7  CREATININE 0.84  --   --  0.50 0.61  CALCIUM 8.6*  --   --  8.8* 8.8*  MG  --  2.2  --  2.3 2.1  PHOS  --   --   --  4.0  --    Liver Function Tests: Recent Labs  Lab 04/19/20 0855  AST 17  ALT 16  ALKPHOS 68  BILITOT 0.5  PROT 7.4  ALBUMIN 3.5   No results for input(s): LIPASE, AMYLASE in the last 168 hours. Recent Labs  Lab 04/19/20 0231  AMMONIA 46*   CBC: Recent Labs  Lab 04/18/20 0112 04/18/20 2054 04/19/20 0855 04/20/20 0338  WBC 22.2*  --  15.3* 14.7*  NEUTROABS  --   --  12.1* 11.5*  HGB 11.6* 13.3 11.9* 11.4*  HCT 38.4 39.0 38.2 37.1  MCV 95.0  --  95.0 96.1  PLT 295  --  299 276   Cardiac Enzymes: No results for input(s): CKTOTAL, CKMB, CKMBINDEX, TROPONINI in the last 168 hours. BNP: Invalid input(s): POCBNP CBG: Recent Labs  Lab 04/19/20 1719 04/19/20 1959 04/20/20 0003 04/20/20 0313 04/20/20 0844  GLUCAP 106* 126* 94 110* 124*  D-Dimer No results for input(s): DDIMER in the last 72 hours. Hgb A1c Recent Labs    04/19/20 0231  HGBA1C 6.2*   Lipid Profile No results for input(s): CHOL, HDL, LDLCALC, TRIG, CHOLHDL, LDLDIRECT in the last 72 hours. Thyroid function studies Recent Labs    04/19/20 0855  TSH 1.069   Anemia work up No results for input(s): VITAMINB12, FOLATE, FERRITIN, TIBC, IRON, RETICCTPCT in the last 72 hours. Urinalysis    Component Value Date/Time   COLORURINE YELLOW 04/19/2020 1900   APPEARANCEUR CLEAR 04/19/2020 1900   LABSPEC 1.026 04/19/2020 1900   PHURINE 5.0 04/19/2020 1900   GLUCOSEU NEGATIVE 04/19/2020 1900   HGBUR NEGATIVE 04/19/2020 1900   BILIRUBINUR NEGATIVE 04/19/2020 1900   KETONESUR NEGATIVE 04/19/2020 1900   PROTEINUR NEGATIVE  04/19/2020 1900   UROBILINOGEN 0.2 02/28/2012 2004   NITRITE NEGATIVE 04/19/2020 1900   LEUKOCYTESUR NEGATIVE 04/19/2020 1900   Sepsis Labs Invalid input(s): PROCALCITONIN,  WBC,  LACTICIDVEN Microbiology Recent Results (from the past 240 hour(s))  Resp Panel by RT-PCR (Flu A&B, Covid) Nasopharyngeal Swab     Status: None   Collection Time: 04/10/20  6:55 PM   Specimen: Nasopharyngeal Swab; Nasopharyngeal(NP) swabs in vial transport medium  Result Value Ref Range Status   SARS Coronavirus 2 by RT PCR NEGATIVE NEGATIVE Final    Comment: (NOTE) SARS-CoV-2 target nucleic acids are NOT DETECTED.  The SARS-CoV-2 RNA is generally detectable in upper respiratory specimens during the acute phase of infection. The lowest concentration of SARS-CoV-2 viral copies this assay can detect is 138 copies/mL. A negative result does not preclude SARS-Cov-2 infection and should not be used as the sole basis for treatment or other patient management decisions. A negative result may occur with  improper specimen collection/handling, submission of specimen other than nasopharyngeal swab, presence of viral mutation(s) within the areas targeted by this assay, and inadequate number of viral copies(<138 copies/mL). A negative result must be combined with clinical observations, patient history, and epidemiological information. The expected result is Negative.  Fact Sheet for Patients:  EntrepreneurPulse.com.au  Fact Sheet for Healthcare Providers:  IncredibleEmployment.be  This test is no t yet approved or cleared by the [place] FDA and  has been authorized for detection and/or diagnosis of SARS-CoV-2 by FDA under an Emergency Use Authorization (EUA). This EUA will remain  in effect (meaning this test can be used) for the duration of the COVID-19 declaration under Section 564(b)(1) of the Act, 21 U.S.C.section 360bbb-3(b)(1), unless the authorization is  terminated  or revoked sooner.       Influenza A by PCR NEGATIVE NEGATIVE Final   Influenza B by PCR NEGATIVE NEGATIVE Final    Comment: (NOTE) The Xpert Xpress SARS-CoV-2/FLU/RSV plus assay is intended as an aid in the diagnosis of influenza from Nasopharyngeal swab specimens and should not be used as a sole basis for treatment. Nasal washings and aspirates are unacceptable for Xpert Xpress SARS-CoV-2/FLU/RSV testing.  Fact Sheet for Patients: EntrepreneurPulse.com.au  Fact Sheet for Healthcare Providers: IncredibleEmployment.be  This test is not yet approved or cleared by the [place] FDA and has been authorized for detection and/or diagnosis of SARS-CoV-2 by FDA under an Emergency Use Authorization (EUA). This EUA will remain in effect (meaning this test can be used) for the duration of the COVID-19 declaration under Section 564(b)(1) of the Act, 21 U.S.C. section 360bbb-3(b)(1), unless the authorization is terminated or revoked.  Performed at [facility], [place] [address], [place], [place] [address]  Urine culture     Status: Abnormal   Collection Time: 04/11/20 12:37 AM   Specimen: Urine, Random  Result Value Ref Range Status   Specimen Description URINE, RANDOM  Final   Special Requests NONE  Final   Culture (A)  Final    <10,000 COLONIES/mL INSIGNIFICANT GROWTH Performed at [facility], [address] [place], [place] [address]    Report Status 04/12/2020 FINAL  Final  Resp Panel by RT-PCR (Flu A&B, Covid) Nasopharyngeal Swab     Status: None   Collection Time: 04/18/20 12:46 PM   Specimen: Nasopharyngeal Swab; Nasopharyngeal(NP) swabs in vial transport medium  Result Value Ref Range Status   SARS Coronavirus 2 by RT PCR NEGATIVE NEGATIVE Final    Comment: (NOTE) SARS-CoV-2 target nucleic acids are NOT DETECTED.  The SARS-CoV-2 RNA is generally detectable in upper respiratory specimens during the acute  phase of infection. The lowest concentration of SARS-CoV-2 viral copies this assay can detect is 138 copies/mL. A negative result does not preclude SARS-Cov-2 infection and should not be used as the sole basis for treatment or other patient management decisions. A negative result may occur with  improper specimen collection/handling, submission of specimen other than nasopharyngeal swab, presence of viral mutation(s) within the areas targeted by this assay, and inadequate number of viral copies(<138 copies/mL). A negative result must be combined with clinical observations, patient history, and epidemiological information. The expected result is Negative.  Fact Sheet for Patients:  EntrepreneurPulse.com.au  Fact Sheet for Healthcare Providers:  IncredibleEmployment.be  This test is no t yet approved or cleared by the [place] FDA and  has been authorized for detection and/or diagnosis of SARS-CoV-2 by FDA under an Emergency Use Authorization (EUA). This EUA will remain  in effect (meaning this test can be used) for the duration of the COVID-19 declaration under Section 564(b)(1) of the Act, 21 U.S.C.section 360bbb-3(b)(1), unless the authorization is terminated  or revoked sooner.       Influenza A by PCR NEGATIVE NEGATIVE Final   Influenza B by PCR NEGATIVE NEGATIVE Final    Comment: (NOTE) The Xpert Xpress SARS-CoV-2/FLU/RSV plus assay is intended as an aid in the diagnosis of influenza from Nasopharyngeal swab specimens and should not be used as a sole basis for treatment. Nasal washings and aspirates are unacceptable for Xpert Xpress SARS-CoV-2/FLU/RSV testing.  Fact Sheet for Patients: EntrepreneurPulse.com.au  Fact Sheet for Healthcare Providers: IncredibleEmployment.be  This test is not yet approved or cleared by the [place] FDA and has been authorized for detection and/or diagnosis of  SARS-CoV-2 by FDA under an Emergency Use Authorization (EUA). This EUA will remain in effect (meaning this test can be used) for the duration of the COVID-19 declaration under Section 564(b)(1) of the Act, 21 U.S.C. section 360bbb-3(b)(1), unless the authorization is terminated or revoked.  Performed at [facility], [place] [address] [place], [place] [address]   Blood culture (routine x 2)     Status: None (Preliminary result)   Collection Time: 04/18/20 12:50 PM   Specimen: BLOOD  Result Value Ref Range Status   Specimen Description BLOOD LEFT ANTECUBITAL  Final   Special Requests   Final    BOTTLES DRAWN AEROBIC AND ANAEROBIC Blood Culture adequate volume   Culture   Final    NO GROWTH 2 DAYS Performed at [facility], [place] [address] [place], [place] [address]    Report Status PENDING  Incomplete  MRSA PCR Screening  Status: None   Collection Time: 04/19/20 10:37 AM   Specimen: Nasopharyngeal  Result Value Ref Range Status   MRSA by PCR NEGATIVE NEGATIVE Final    Comment:        The GeneXpert MRSA Assay (FDA approved for NASAL specimens only), is one component of a comprehensive MRSA colonization surveillance program. It is not intended to diagnose MRSA infection nor to guide or monitor treatment for MRSA infections. Performed at [facility], [place] [address], [place], [place] [address]      Time coordinating discharge: Over 30 minutes  SIGNED:   [name], MD  [facility] 04/20/2020, 9:27 AM  If 7PM-7AM, please contact night-coverage [email or web address]

## 2020-04-20 NOTE — Discharge Instructions (Signed)

## 2020-04-20 NOTE — Plan of Care (Signed)

## 2020-04-20 NOTE — Progress Notes (Signed)
D/C instructions given and reviewed. Questions asked and answered. IV removed, tolerated well. 

## 2020-04-20 NOTE — Consult Note (Signed)
   [facility] CM Inpatient Consult   04/20/2020  Michele Owens [birth year] [id number]   Managed Medicaid  Patient evaluated for community based chronic complex disease management services with [facility] Managed Medicaid Program.    Plan: Referral to Managed Medicaid team for post hospital follow up for chronic disease.  Patient will receive post hospital discharge call and will be evaluated for complex disease management.      Of note, [facility] Care Management services does not replace or interfere with any services that are arranged by inpatient case management or social work.  For additional questions or referrals please contact:    Natividad Brood, RN BSN [facility] Liaison  [phone] business mobile phone Toll free office [phone]  Fax number: [phone] [email or web address]  [email or web address]

## 2020-04-21 LAB — URINE CULTURE: Culture: NO GROWTH

## 2020-04-23 ENCOUNTER — Other Ambulatory Visit: Payer: Self-pay | Admitting: Obstetrics and Gynecology

## 2020-04-23 ENCOUNTER — Other Ambulatory Visit: Payer: Self-pay

## 2020-04-23 LAB — CULTURE, BLOOD (ROUTINE X 2)
Culture: NO GROWTH
Special Requests: ADEQUATE

## 2020-04-23 NOTE — Patient Outreach (Cosign Needed)
Medicaid Managed Care   Nurse Care Manager Note  04/23/2020 Name:  Michele Owens MRN:  [id number] DOB:  [birth year]  Michele Owens is an 60 y.o. year old female who is a primary patient of [name], [name], MD.  [facility] Managed Care Coordination team was consulted for assistance with:    chronic healthcare management needs.  Ms. Megill was given information about Medicaid Managed Care Coordination team services today. [name] agreed to services and verbal consent obtained.  Engaged with patient by telephone for follow up visit in response to provider referral for case management and/or care coordination services.   Assessments/Interventions:  Review of past medical history, allergies, medications, health status, including review of consultants reports, laboratory and other test data, was performed as part of comprehensive evaluation and provision of chronic care management services.  SDOH (Social Determinants of Health) assessments and interventions performed:   Care Plan  Allergies  Allergen Reactions  . Bee Venom Swelling and Other (See Comments)    "All over my body" (swelling)  . Fioricet [Butalbital-Apap-Caffeine] Nausea And Vomiting and Rash  . Ibuprofen Rash and Other (See Comments)    Severe rash  . Lamisil [Terbinafine] Rash and Other (See Comments)    Pt states this causes her to "feel funny"  . Nsaids Other (See Comments)    Per MD's orders     Medications Reviewed Today    Reviewed by [name], RN (Registered Nurse) on 04/23/20 at 1304  Med List Status: <None>  Medication Order Taking? Sig Documenting Provider Last Dose Status Informant  albuterol (PROVENTIL HFA;VENTOLIN HFA) 108 (90 Base) MCG/ACT inhaler [id number] No Inhale 1-2 puffs into the lungs every 6 (six) hours as needed for wheezing or shortness of breath. Long, [name], MD unk Active Multiple Informants  allopurinol (ZYLOPRIM) 100 MG tablet [id number] No Take 100 mg by mouth daily.  [provider] 04/17/2020 Active Multiple Informants           Med Note [name], [name]   Tue Oct 23, 2017  2:58 AM)    [name] ELLIPTA 62.5-25 MCG/INH AEPB [id number] No Inhale 1 puff into the lungs daily.  [provider] 04/18/2020 Unknown time Active Multiple Informants  benzonatate (TESSALON) 200 MG capsule [id number] No Take 1 capsule (200 mg total) by mouth in the morning, at noon, and at bedtime. 7 DS Niel Hummer A, MD 04/16/2020 Active Multiple Informants  Blood Glucose Monitoring Suppl (ACCU-CHEK AVIVA PLUS) w/Device KIT [id number] No 1 each by Other route daily.  [provider] unk unk Active Multiple Informants  calcitRIOL (ROCALTROL) 0.5 MCG capsule [id number] No Take 1 capsule (0.5 mcg total) by mouth daily. Modena Jansky, MD 04/17/2020 Active Multiple Informants           Med Note [name], [name]   Tue Oct 23, 2017  2:59 AM)    carvedilol (COREG) 6.25 MG tablet [id number] No Take 1 tablet (6.25 mg total) by mouth 2 (two) times daily with a meal. Bensimhon, [name], MD 04/17/2020 1700 Active Multiple Informants  cefdinir (OMNICEF) 300 MG capsule [id number]  Take 1 capsule (300 mg total) by mouth 2 (two) times daily for 4 days. [name], MD  Active   clonazePAM [name]) 2 MG tablet [id number] No Take 1 tablet (2 mg total) by mouth 3 (three) times daily. for anxiety [name], MD 04/18/2020 Unknown time Active Multiple Informants  cyclobenzaprine (FLEXERIL) 10 MG tablet [id number] No Take 10 mg by mouth 3 (three)  times daily. [provider] 04/17/2020 Active Multiple Informants  diclofenac sodium (VOLTAREN) 1 % GEL [id number] No Apply 2 g topically 4 (four) times daily as needed (for pain). [name], MD Past Week Unknown time Active Multiple Informants  EPINEPHrine 0.3 mg/0.3 mL IJ SOAJ injection [id number] No Inject 0.3 mg into the muscle daily as needed (allergic reaction). [provider] unk Active Multiple Informants  FEROSUL  325 (65 Fe) MG tablet [id number] No Take 325 mg by mouth daily. [provider] 04/17/2020 Active Multiple Informants           Med Note [name], [name]   Tue Oct 23, 2017  2:57 AM)    glucose blood test strip [id number] No Use as instructed to check blood sugar up to 4 times daily  Patient taking differently: 1 each by Other route See admin instructions. Use as instructed to check blood sugar up to 4 times daily   [name], DO unk unk Active Multiple Informants  guaiFENesin (MUCINEX) 600 MG 12 hr tablet [id number] No Take 2 tablets (1,200 mg total) by mouth 2 (two) times daily. Regalado, Belkys A, MD unk Active Multiple Informants  ipratropium-albuterol (DUONEB) 0.5-2.5 (3) MG/3ML SOLN [id number] No Take 3 mLs by nebulization 4 (four) times daily.  [provider] unk Active Multiple Informants           Med Note [name], [name]   Tue Oct 23, 2017  2:57 AM)    levothyroxine (SYNTHROID, LEVOTHROID) 112 MCG tablet [id number] No Take 224 mcg by mouth daily. [provider] 04/18/2020 Unknown time Active Multiple Informants           Med Note [name], [name]   Tue Oct 23, 2017  2:57 AM)    lisinopril (PRINIVIL,ZESTRIL) 5 MG tablet [id number] No Take 1 tablet (5 mg total) by mouth daily. Please call for office visit [phone] [name], MD 04/18/2020 Unknown time Active Multiple Informants           Med Note [name], [name]   Tue Oct 23, 2017  2:58 AM)    loperamide (IMODIUM) 2 MG capsule [id number] No Take 1 capsule (2 mg total) by mouth 4 (four) times daily as needed for diarrhea or loose stools. [name], MD unk Active Multiple Informants  metFORMIN (GLUCOPHAGE) 500 MG tablet [id number] No Take 1 tablet (500 mg total) by mouth 2 (two) times daily with a meal. [name], MD 04/17/2020 Active Multiple Informants           Med Note [name], [name]   Tue Oct 23, 2017  2:58 AM)    nitroGLYCERIN (NITROSTAT) 0.4 MG SL tablet [id number] No Place 1 tablet  (0.4 mg total) under the tongue every 5 (five) minutes as needed for chest pain. [name], MD unk Active Multiple Informants  nystatin cream (MYCOSTATIN) [id number] No Apply 1 application topically 2 (two) times daily as needed for dry skin. Regalado, Belkys A, MD 04/16/2020 Active Multiple Informants  omeprazole (PRILOSEC) 20 MG capsule [id number] No Take 20 mg by mouth 2 (two) times daily before a meal. [provider] 04/17/2020 Active Multiple Informants  oxyCODONE-acetaminophen (PERCOCET) 10-325 MG tablet [id number] No Take 1 tablet by mouth every 6 (six) hours as needed for pain. [provider] 04/17/2020 Active Multiple Informants           Med Note Marcellina Millin Apr 12, 2020  1:22 PM) LF on 03-25-20 # 120  DS 30  OXYGEN [id number] No Inhale 2-3 L into the lungs continuous. [provider] 01/14/2020 Unknown time Active Multiple Informants           Med Note Corky Mull   Mon Apr 12, 2020 10:08 AM)    potassium chloride SA (K-DUR,KLOR-CON) 20 MEQ tablet [id number] No Take 1 tablet (20 mEq total) by mouth 2 (two) times daily. [name], MD 04/17/2020 Active Multiple Informants  QC STOOL SOFTENER PLS LAXATIVE 8.6-50 MG tablet [id number] No Take 1 tablet by mouth at bedtime. [provider] 04/17/2020 Active Multiple Informants           Med Note [name], [name]   Tue Oct 23, 2017  2:56 AM)    rosuvastatin (CRESTOR) 10 MG tablet [id number] No Take 10 mg by mouth every evening. [provider] 04/16/2020 Active Multiple Informants  torsemide (DEMADEX) 20 MG tablet [id number] No TAKE 4 (FOUR) TABLETS BY MOUTH DAILY (2AM+ 2NOON)  Patient taking differently: Take 80 mg by mouth 2 (two) times daily.   [name], MD 04/18/2020 Unknown time Active Multiple Informants  traZODone (DESYREL) 50 MG tablet [id number] No Take 100 mg by mouth at bedtime. [provider] 04/17/2020 Active Multiple Informants          Patient Active  Problem List   Diagnosis Date Noted  . Acute on chronic respiratory failure with hypoxia and hypercapnia ([place]) 04/18/2020  . Acute on chronic respiratory failure with hypoxia ([place]) 04/18/2020  . Hemoptysis 04/18/2020  . Acute metabolic encephalopathy 41/96/2229  . Acute encephalopathy 04/18/2020  . Chronic diastolic CHF (congestive heart failure) ([place]) 04/18/2020  . Sepsis ([place]) 04/18/2020  . Morbid obesity with BMI of 50.0-59.9, adult ([place])   . Fall at home, initial encounter 06/23/2018  . HNP (herniated nucleus pulposus), thoracic 06/23/2018  . CHF (congestive heart failure) ([place]) 06/18/2018  . Hypertensive disorder 08/28/2017  . Obesity 08/28/2017  . COPD exacerbation ([place]) 08/04/2017  . Acute and chronic respiratory failure with hypercapnia ([place])   . Pulmonary HTN ([place])   . Genetic testing 02/27/2017  . Family history of thyroid cancer   . Hypomagnesemia 10/03/2016  . Atypical chest pain   . HLD (hyperlipidemia) 09/23/2016  . Gout 09/23/2016  . DVT (deep vein thrombosis) in pregnancy 09/23/2016  . Thyroid cancer ([place]) 09/23/2016  . Hypocalcemia 09/23/2016  . AKI (acute kidney injury) ([place]) 09/23/2016  . Acute pulmonary edema ([facility])   . Acute hypoxemic respiratory failure ([place])   . Ventilator dependent ([place])   . Chronic respiratory failure with hypoxia and hypercapnia ([place]) 07/16/2016  . CAP (community acquired pneumonia) 01/23/2016  . 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 Pulmonary hypertension ([place]) 01/23/2016  . Obesity hypoventilation syndrome ([place]) 08/26/2015  . Dyslipidemia associated with type 2 diabetes mellitus ([place]) 04/24/2015  . Leukocytosis 04/24/2015  . Controlled type 2 diabetes mellitus without complication, without long-term current use of insulin ([place]) 04/24/2015  . Anxiety 04/24/2015  . Benign essential HTN 04/24/2015  . Normocytic anemia 04/24/2015  . OSA (obstructive sleep apnea) 05/10/2012  . Tobacco abuse 07/04/2011     Conditions to be addressed/monitored per PCP order:  chronic healthcare management needs        Problem: Health Promotion or Disease Self-Management (General Plan of Care)   Priority: Medium  Onset Date: 04/23/2020    Care Plan : General Plan of Care (Adult)  Updates made by [name], RN since 04/23/2020 12:00 AM  Problem: Health Promotion or Disease Self-Management (General Plan of Care)     Long-Range Goal: Self-Management Plan Developed   Start Date: 04/23/2020  Expected End Date: 07/22/2020  This Visit's Progress: Not on track  Priority: Medium  Note:   Current Barriers:  . Chronic Disease Management support and education needs.  Nurse Case Manager Clinical Goal(s):  Marland Kitchen Over the next 30 days, patient will attend all scheduled medical appointments:  . Over the next 30 days, patient will work with CM team pharmacist to review medications.  Interventions:  . Inter-disciplinary care team collaboration (see longitudinal plan of care) . Evaluation of current treatment plan and patient's adherence to plan as established by provider. . Reviewed medications with patient. [name] with pharmacist regarding medications. . Discussed plans with patient for ongoing care management follow up and provided patient with direct contact information for care management team . Reviewed scheduled/upcoming provider appointments.  . Pharmacy referral for medication review.  Patient Goals/Self-Care Activities Over the next 30 days, patient will:  -Attends all scheduled provider appointments Calls pharmacy for medication refills Calls provider office for new concerns or questions  Follow Up Plan: The Managed Medicaid care management team will reach out to the patient again over the next 30 days.  The patient has been provided with contact information for the Managed Medicaid care management team and has been advised to call with any health related questions or concerns.            Follow Up:  Patient agrees to Care Plan and Follow-up.  Plan: The Managed Medicaid care management team will reach out to the patient again over the next 30 days. and The patient has been provided with contact information for the Managed Medicaid care management team and has been advised to call with any health related questions or concerns.  Date/time of next scheduled RN care management/care coordination outreach:  05/24/20 at 1030

## 2020-04-23 NOTE — Patient Instructions (Signed)
Visit Information  Michele Owens was given information about Medicaid Managed Care team care coordination services as a part of their [facility] Medicaid benefit. Michele Owens verbally consented to engagement with the [facility] Managed Care team.   For questions related to your [facility] health plan, please call: [phone]  If you would like to schedule transportation through your [facility] plan, please call the following number at least 2 days in advance of your appointment: [phone]  Michele Owens - following are the goals we discussed in your visit today:  Goals Addressed            This Visit's Progress   . Protect My Health       Timeframe:  Long-Range Goal Priority:  Medium Start Date:    04/23/20                         Expected End Date:       07/22/20                Follow Up Date 05/24/20   - schedule appointment for vaccines needed due to my age or health - schedule recommended health tests (blood work, mammogram, colonoscopy, pap test) - schedule and keep appointment for annual check-up        Patient verbalizes understanding of instructions provided today.   The Managed Medicaid care management team will reach out to the patient again over the next 30 days.  The patient has been provided with contact information for the Managed Medicaid care management team and has been advised to call with any health related questions or concerns.  [name] RN, BSN [place] Management Coordinator - Managed Medicaid High Risk [phone]  Following is a copy of your plan of care:      Problem Identified: Health Promotion or Disease Self-Management (General Plan of Care)   Priority: Medium  Onset Date: 04/23/2020    Patient Care Plan: General Plan of Care (Adult)         Long-Range Goal: Self-Management Plan Developed   Start Date: 04/23/2020  Expected End Date: 07/22/2020  This Visit's Progress: Not on track  Priority:  Medium  Note:   Current Barriers:  . Chronic Disease Management support and education needs.  Nurse Case Manager Clinical Goal(s):  Marland Kitchen Over the next 30 days, patient will attend all scheduled medical appointments:  . Over the next 30 days, patient will work with CM team pharmacist to review medications.  Interventions:  . Inter-disciplinary care team collaboration (see longitudinal plan of care) . Evaluation of current treatment plan and patient's adherence to plan as established by provider. . Reviewed medications with patient. [name] with pharmacist regarding medications. . Discussed plans with patient for ongoing care management follow up and provided patient with direct contact information for care management team . Reviewed scheduled/upcoming provider appointments.  . Pharmacy referral for medication review.  Patient Goals/Self-Care Activities Over the next 30 days, patient will:  -Attends all scheduled provider appointments Calls pharmacy for medication refills Calls provider office for new concerns or questions  Follow Up Plan: The Managed Medicaid care management team will reach out to the patient again over the next 30 days.  The patient has been provided with contact information for the Managed Medicaid care management team and has been advised to call with any health related questions or concerns.

## 2020-04-28 ENCOUNTER — Other Ambulatory Visit: Payer: Self-pay

## 2020-04-28 ENCOUNTER — Other Ambulatory Visit: Payer: Self-pay | Admitting: Obstetrics and Gynecology

## 2020-04-28 NOTE — Patient Outreach (Signed)
Care Coordination  04/28/2020  Loriana Samad 1960-08-06 [id number]   RNCM returned patient's phone call and answered her questions.  [name] RN, BSN [place]  [place] Curator - Managed Medicaid High Risk [phone].

## 2020-04-30 ENCOUNTER — Other Ambulatory Visit: Payer: Self-pay

## 2020-05-03 ENCOUNTER — Other Ambulatory Visit: Payer: Self-pay | Admitting: Obstetrics and Gynecology

## 2020-05-03 ENCOUNTER — Other Ambulatory Visit: Payer: Self-pay

## 2020-05-03 DIAGNOSIS — R0602 Shortness of breath: Secondary | ICD-10-CM

## 2020-05-03 NOTE — Patient Instructions (Addendum)
Visit Information  Ms. Michele Owens was given information about Medicaid Managed Michele team Michele coordination services as a part of their [facility] Medicaid benefit. Michele Owens verbally consented Owens engagement with the [facility] Managed Michele team.   For questions related Owens your [facility] health plan, please call: [phone]  If you would like Owens schedule transportation through your [facility] plan, please call the following number at least 2 days in advance of your appointment: [phone]  Patient verbalizes understanding of instructions provided today.   The patient will call Michele Owens. RN Michele Manager will make transportation referral. Michele Owens will provide additional transportation resources. The Managed Medicaid Michele management team will reach out Owens the patient again over the next 30 days.  The patient has been provided with contact information for the Managed Medicaid Michele management team and has been advised Owens call with any health related questions or concerns.   Michele Raider RN, BSN [place] Management Coordinator - Managed Medicaid High Risk [phone]  Following is a copy of your plan of Michele:  Patient Michele Plan: General Plan of Michele (Adult)    Problem Identified: Health Promotion or Disease Self-Management (General Plan of Michele)   Priority: Medium  Onset Date: 04/23/2020      Start Date: 04/23/2020  Expected End Date: 07/22/2020  This Visit's Progress: Not on track  Priority: Medium  Note:   Current Barriers:  . Chronic Disease Management support and education needs.  Nurse Case Manager Clinical Goal(s):  Marland Kitchen Over the next 30 days, patient will attend all scheduled medical appointments:  . Over the next 30 days, patient will work with CM team pharmacist Owens review medications.  Interventions:  . Inter-disciplinary Michele team collaboration (see longitudinal plan of Michele) . Evaluation of  current treatment plan and patient's adherence Owens plan as established by provider. . Reviewed medications with patient. Michele Owens with pharmacist regarding medications. . Discussed plans with patient for ongoing Michele management follow up and provided patient with direct contact information for Michele management team . Reviewed scheduled/upcoming provider appointments.  . Pharmacy referral for medication review.  Patient Goals/Self-Michele Activities Over the next 30 days, patient will:  -Attends all scheduled provider appointments Calls pharmacy for medication refills Calls provider office for new concerns or questions  Follow Up Plan: The Managed Medicaid Michele management team will reach out Owens the patient again over the next 30 days.  The patient has been provided with contact information for the Managed Medicaid Michele management team and has been advised Owens call with any health related questions or concerns.            Michele PLAN ENTRY Medicaid Managed Michele (see longitudinal plan of Michele for additional Michele plan information)  Current Barriers:  . Transportation barriers.  Nurse Clinical Goal(s):  Marland Kitchen Over the next 30 days, patient will work with Michele Owens Owens address needs related Owens transportation. . Over the next day  patient will follow up with [place] as directed by [facility]. Marland Kitchen Over the next 90 days, patient will attend all scheduled medical appointments.  Interventions: . Inter-disciplinary Michele team collaboration (see longitudinal plan of Michele) . Referred patient Owens community resources Michele Owens team for assistance with transportation. . Provided patient with information about Michele Owens. . Discussed plans with patient for ongoing Michele management follow up and provided patient with direct contact information for Michele management team . Advised patient Owens call [place]. Marland Kitchen  Collaborated with [facility] regarding  transportation. . Assisted patient/caregiver with obtaining information about health plan benefits  Patient Self Michele Activities:  . Patient will call Michele Owens.

## 2020-05-03 NOTE — Patient Outreach (Signed)
Medicaid Managed Care   Nurse Care Manager Note  05/03/2020 Name:  Michele Owens MRN:  [id number] DOB:  1960-11-28  Michele Owens is an 60 y.o. year old female who is a primary patient of [name], [name], MD.  The [facility] Managed Care Coordination team was consulted for assistance with:    chronic healthcare management needs.  Michele Owens was given information about Medicaid Managed Care Coordination team services today. Michele Owens agreed to services and verbal consent obtained.  Engaged with patient by telephone for follow up visit in response to provider referral for case management and/or care coordination services.   Assessments/Interventions:  Review of past medical history, allergies, medications, health status, including review of consultants reports, laboratory and other test data, was performed as part of comprehensive evaluation and provision of chronic care management services.  SDOH (Social Determinants of Health) assessments and interventions performed: SDOH Interventions   Flowsheet Row Most Recent Value  SDOH Interventions   SDOH Interventions for the Following Domains Transportation  Transportation Interventions Other (Comment)  [patient given phone number for [facility] Transportation services and referral made.]      Care Plan  Allergies  Allergen Reactions  . Bee Venom Swelling and Other (See Comments)    "All over my body" (swelling)  . Fioricet [Butalbital-Apap-Caffeine] Nausea And Vomiting and Rash  . Ibuprofen Rash and Other (See Comments)    Severe rash  . Lamisil [Terbinafine] Rash and Other (See Comments)    Pt states this causes her to "feel funny"  . Nsaids Other (See Comments)    Per MD's orders     Medications Reviewed Today    Reviewed by [name], RN (Registered Nurse) on 04/23/20 at 1304  Med List Status: <None>  Medication Order Taking? Sig Documenting Provider Last Dose Status Informant  albuterol (PROVENTIL HFA;VENTOLIN HFA) 108  (90 Base) MCG/ACT inhaler [id number] No Inhale 1-2 puffs into the lungs every 6 (six) hours as needed for wheezing or shortness of breath. Long, [name], MD unk Active Multiple Informants  allopurinol (ZYLOPRIM) 100 MG tablet [id number] No Take 100 mg by mouth daily. [provider] 04/17/2020 Active Multiple Informants           Med Note [name], [name]   Tue Oct 23, 2017  2:58 AM)    [name] ELLIPTA 62.5-25 MCG/INH AEPB [id number] No Inhale 1 puff into the lungs daily.  [provider] 04/18/2020 Unknown time Active Multiple Informants  benzonatate (TESSALON) 200 MG capsule [id number] No Take 1 capsule (200 mg total) by mouth in the morning, at Owens, and at bedtime. 7 DS Niel Hummer A, MD 04/16/2020 Active Multiple Informants  Blood Glucose Monitoring Suppl (ACCU-CHEK AVIVA PLUS) w/Device KIT [id number] No 1 each by Other route daily.  [provider] unk unk Active Multiple Informants  calcitRIOL (ROCALTROL) 0.5 MCG capsule [id number] No Take 1 capsule (0.5 mcg total) by mouth daily. Modena Jansky, MD 04/17/2020 Active Multiple Informants           Med Note [name], [name]   Tue Oct 23, 2017  2:59 AM)    carvedilol (COREG) 6.25 MG tablet [id number] No Take 1 tablet (6.25 mg total) by mouth 2 (two) times daily with a meal. Bensimhon, [name], MD 04/17/2020 1700 Active Multiple Informants  cefdinir (OMNICEF) 300 MG capsule [id number]  Take 1 capsule (300 mg total) by mouth 2 (two) times daily for 4 days. [name], MD  Active   clonazePAM [name]) 2 MG  tablet [id number] No Take 1 tablet (2 mg total) by mouth 3 (three) times daily. for anxiety [name], MD 04/18/2020 Unknown time Active Multiple Informants  cyclobenzaprine (FLEXERIL) 10 MG tablet [id number] No Take 10 mg by mouth 3 (three) times daily. [provider] 04/17/2020 Active Multiple Informants  diclofenac sodium (VOLTAREN) 1 % GEL [id number] No Apply 2 g topically 4 (four) times daily as needed (for  pain). [name], MD Past Week Unknown time Active Multiple Informants  EPINEPHrine 0.3 mg/0.3 mL IJ SOAJ injection [id number] No Inject 0.3 mg into the muscle daily as needed (allergic reaction). [provider] unk Active Multiple Informants  FEROSUL 325 (65 Fe) MG tablet [id number] No Take 325 mg by mouth daily. [provider] 04/17/2020 Active Multiple Informants           Med Note [name], [name]   Tue Oct 23, 2017  2:57 AM)    glucose blood test strip [id number] No Use as instructed to check blood sugar up to 4 times daily  Patient taking differently: 1 each by Other route See admin instructions. Use as instructed to check blood sugar up to 4 times daily   [name], DO unk unk Active Multiple Informants  guaiFENesin (MUCINEX) 600 MG 12 hr tablet [id number] No Take 2 tablets (1,200 mg total) by mouth 2 (two) times daily. Regalado, Belkys A, MD unk Active Multiple Informants  ipratropium-albuterol (DUONEB) 0.5-2.5 (3) MG/3ML SOLN [id number] No Take 3 mLs by nebulization 4 (four) times daily.  [provider] unk Active Multiple Informants           Med Note [name], [name]   Tue Oct 23, 2017  2:57 AM)    levothyroxine (SYNTHROID, LEVOTHROID) 112 MCG tablet [id number] No Take 224 mcg by mouth daily. [provider] 04/18/2020 Unknown time Active Multiple Informants           Med Note [name], [name]   Tue Oct 23, 2017  2:57 AM)    lisinopril (PRINIVIL,ZESTRIL) 5 MG tablet [id number] No Take 1 tablet (5 mg total) by mouth daily. Please call for office visit [phone] [name], MD 04/18/2020 Unknown time Active Multiple Informants           Med Note [name], [name]   Tue Oct 23, 2017  2:58 AM)    loperamide (IMODIUM) 2 MG capsule [id number] No Take 1 capsule (2 mg total) by mouth 4 (four) times daily as needed for diarrhea or loose stools. [name], MD unk Active Multiple Informants  metFORMIN (GLUCOPHAGE) 500 MG tablet [id number] No Take  1 tablet (500 mg total) by mouth 2 (two) times daily with a meal. [name], MD 04/17/2020 Active Multiple Informants           Med Note [name], [name]   Tue Oct 23, 2017  2:58 AM)    nitroGLYCERIN (NITROSTAT) 0.4 MG SL tablet [id number] No Place 1 tablet (0.4 mg total) under the tongue every 5 (five) minutes as needed for chest pain. [name], MD unk Active Multiple Informants  nystatin cream (MYCOSTATIN) [id number] No Apply 1 application topically 2 (two) times daily as needed for dry skin. Niel Hummer A, MD 04/16/2020 Active Multiple Informants  omeprazole (PRILOSEC) 20 MG capsule [id number] No Take 20 mg by mouth 2 (two) times daily before a meal. [provider] 04/17/2020 Active Multiple Informants  oxyCODONE-acetaminophen (PERCOCET) 10-325 MG tablet [id number] No Take 1 tablet by mouth every 6 (six)  hours as needed for pain. [provider] 04/17/2020 Active Multiple Informants           Med Note Marcellina Millin Apr 12, 2020  1:22 PM) LF on 03-25-20 # 120 DS 30  OXYGEN [id number] No Inhale 2-3 L into the lungs continuous. [provider] 01/14/2020 Unknown time Active Multiple Informants           Med Note Corky Mull   Mon Apr 12, 2020 10:08 AM)    potassium chloride SA (K-DUR,KLOR-CON) 20 MEQ tablet [id number] No Take 1 tablet (20 mEq total) by mouth 2 (two) times daily. [name], MD 04/17/2020 Active Multiple Informants  QC STOOL SOFTENER PLS LAXATIVE 8.6-50 MG tablet [id number] No Take 1 tablet by mouth at bedtime. [provider] 04/17/2020 Active Multiple Informants           Med Note [name], [name]   Tue Oct 23, 2017  2:56 AM)    rosuvastatin (CRESTOR) 10 MG tablet [id number] No Take 10 mg by mouth every evening. [provider] 04/16/2020 Active Multiple Informants  torsemide (DEMADEX) 20 MG tablet [id number] No TAKE 4 (FOUR) TABLETS BY MOUTH DAILY (2AM+ 2NOON)  Patient taking differently: Take 80 mg by mouth 2  (two) times daily.   [name], MD 04/18/2020 Unknown time Active Multiple Informants  traZODone (DESYREL) 50 MG tablet [id number] No Take 100 mg by mouth at bedtime. [provider] 04/17/2020 Active Multiple Informants          Patient Active Problem List   Diagnosis Date Noted  . Acute on chronic respiratory failure with hypoxia and hypercapnia ([place]) 04/18/2020  . Acute on chronic respiratory failure with hypoxia ([place]) 04/18/2020  . Hemoptysis 04/18/2020  . Acute metabolic encephalopathy 15/40/0867  . Acute encephalopathy 04/18/2020  . Chronic diastolic CHF (congestive heart failure) ([place]) 04/18/2020  . Sepsis ([place]) 04/18/2020  . Morbid obesity with BMI of 50.0-59.9, adult ([place])   . Fall at home, initial encounter 06/23/2018  . HNP (herniated nucleus pulposus), thoracic 06/23/2018  . CHF (congestive heart failure) ([place]) 06/18/2018  . Hypertensive disorder 08/28/2017  . Obesity 08/28/2017  . COPD exacerbation ([place]) 08/04/2017  . Acute and chronic respiratory failure with hypercapnia ([place])   . Pulmonary HTN ([place])   . Genetic testing 02/27/2017  . Family history of thyroid cancer   . Hypomagnesemia 10/03/2016  . Atypical chest pain   . HLD (hyperlipidemia) 09/23/2016  . Gout 09/23/2016  . DVT (deep vein thrombosis) in pregnancy 09/23/2016  . Thyroid cancer ([place]) 09/23/2016  . Hypocalcemia 09/23/2016  . AKI (acute kidney injury) ([place]) 09/23/2016  . Acute pulmonary edema (HCC)   . Acute hypoxemic respiratory failure ([place])   . Ventilator dependent ([place])   . Chronic respiratory failure with hypoxia and hypercapnia ([place]) 07/16/2016  . CAP (community acquired pneumonia) 01/23/2016  . 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 Pulmonary hypertension ([place]) 01/23/2016  . Obesity hypoventilation syndrome ([place]) 08/26/2015  . Dyslipidemia associated with type 2 diabetes mellitus ([place]) 04/24/2015  . Leukocytosis 04/24/2015  . Controlled type 2  diabetes mellitus without complication, without long-term current use of insulin ([place]) 04/24/2015  . Anxiety 04/24/2015  . Benign essential HTN 04/24/2015  . Normocytic anemia 04/24/2015  . OSA (obstructive sleep apnea) 05/10/2012  . Tobacco abuse 07/04/2011    Conditions to be addressed/monitored per PCP order:  chronic healthcare management needs.  Care Plan : General Plan of Care (Adult)  Updates made by [name], RN since 05/03/2020 12:00 AM    Problem: Barriers to Treatment     Long-Range Goal: Barriers to Treatment Identified and Managed   Start Date: 05/03/2020  Expected End Date: 08/01/2020  This Visit's Progress: Not on track  Priority: High  Note:   CARE PLAN ENTRY Medicaid Managed Care (see longitudinal plan of care for additional care plan information)  Current Barriers:  . Transportation barriers.  Nurse Clinical Goal(s):  Marland Kitchen Over the next 30 days, patient will work with [employer] to address needs related to transportation. . Over the next day  patient will follow up with [place] as directed by [facility]. Marland Kitchen Over the next 90 days, patient will attend all scheduled medical appointments.  Interventions: . Inter-disciplinary care team collaboration (see longitudinal plan of care) . Referred patient to community resources care guide team for assistance with transportation. . Provided patient with information about [employer]. . Discussed plans with patient for ongoing care management follow up and provided patient with direct contact information for care management team . Advised patient to call [place]. Marland Kitchen Collaborated with [facility] regarding transportation. . Assisted patient/caregiver with obtaining information about health plan benefits  Patient Self Care Activities:  . Patient will call [employer].           Follow Up:  Patient agrees to Care Plan and Follow-up.  Plan: The patient has  been provided with contact information for the Managed Medicaid care management team and has been advised to call with any health related questions or concerns.  Date/time of next scheduled RN care management/care coordination outreach:  05/24/20 at 1030

## 2020-05-04 ENCOUNTER — Other Ambulatory Visit: Payer: Self-pay

## 2020-05-04 ENCOUNTER — Ambulatory Visit: Payer: Medicaid Other | Attending: Podiatry

## 2020-05-04 DIAGNOSIS — R262 Difficulty in walking, not elsewhere classified: Secondary | ICD-10-CM

## 2020-05-04 DIAGNOSIS — M79672 Pain in left foot: Secondary | ICD-10-CM

## 2020-05-04 DIAGNOSIS — M6281 Muscle weakness (generalized): Secondary | ICD-10-CM | POA: Diagnosis not present

## 2020-05-04 DIAGNOSIS — Z7689 Persons encountering health services in other specified circumstances: Secondary | ICD-10-CM | POA: Diagnosis not present

## 2020-05-04 DIAGNOSIS — M79671 Pain in right foot: Secondary | ICD-10-CM | POA: Diagnosis not present

## 2020-05-05 DIAGNOSIS — Z1231 Encounter for screening mammogram for malignant neoplasm of breast: Secondary | ICD-10-CM | POA: Diagnosis not present

## 2020-05-05 DIAGNOSIS — Z1389 Encounter for screening for other disorder: Secondary | ICD-10-CM | POA: Diagnosis not present

## 2020-05-05 DIAGNOSIS — Z6841 Body Mass Index (BMI) 40.0 and over, adult: Secondary | ICD-10-CM | POA: Diagnosis not present

## 2020-05-05 DIAGNOSIS — Z113 Encounter for screening for infections with a predominantly sexual mode of transmission: Secondary | ICD-10-CM | POA: Diagnosis not present

## 2020-05-05 DIAGNOSIS — Z01419 Encounter for gynecological examination (general) (routine) without abnormal findings: Secondary | ICD-10-CM | POA: Diagnosis not present

## 2020-05-05 DIAGNOSIS — E669 Obesity, unspecified: Secondary | ICD-10-CM | POA: Diagnosis not present

## 2020-05-05 DIAGNOSIS — Z7689 Persons encountering health services in other specified circumstances: Secondary | ICD-10-CM | POA: Diagnosis not present

## 2020-05-05 DIAGNOSIS — L282 Other prurigo: Secondary | ICD-10-CM | POA: Diagnosis not present

## 2020-05-05 DIAGNOSIS — Z130 Encounter for screening for diseases of the blood and blood-forming organs and certain disorders involving the immune mechanism: Secondary | ICD-10-CM | POA: Diagnosis not present

## 2020-05-05 DIAGNOSIS — Z1272 Encounter for screening for malignant neoplasm of vagina: Secondary | ICD-10-CM | POA: Diagnosis not present

## 2020-05-05 NOTE — Therapy (Signed)
[place], [place], [address] Phone: [phone]   Fax:  [phone]  Physical Therapy Evaluation  Patient Details  Name: Michele Owens MRN: [id number] Date of Birth: 1961/02/07 Referring Provider (PT): Criselda Peaches, DPM (03/17/2020)   Encounter Date: 05/04/2020   PT End of Session - 05/04/20 1408    Visit Number 1    Number of Visits 7    Date for PT Re-Evaluation 06/19/20    Authorization Type MCD [facility] - submitted for auth 05/05/2020    PT Start Time 1409   pt arrived late   PT Stop Time 1449    PT Time Calculation (min) 40 min    Equipment Utilized During Treatment Oxygen    Activity Tolerance Treatment limited secondary to agitation;Other (comment)   agitation and emotional distress   Behavior During Therapy [facility] for tasks assessed/performed           Past Medical History:  Diagnosis Date  . Anxiety   . Arthritis   . Asthma   . Chronic diastolic CHF (congestive heart failure) ([place])   . COPD (chronic obstructive pulmonary disease) (H[facility])    Uses Oxygen at night  . Depression   . Diabetes mellitus without complication ([place])   . Dyspnea    occ  . Family history of thyroid cancer   . GERD (gastroesophageal reflux disease)   . Gout   . Headache    migraines  . History of DVT of lower extremity   . History of nuclear stress test    Myoview 2/17:  Low risk stress nuclear study with a small, moderate intensity, partially reversible inferior lateral defect consistent with small prior infarct and minimal peri-infarct ischemia; EF 68 with normal wall motion  . Hypertension   . Pneumonia   . Thyroid cancer ([place]) 09/23/2016    Past Surgical History:  Procedure Laterality Date  . BUNIONECTOMY Bilateral   . CARDIAC CATHETERIZATION N/A 06/23/2015   Procedure: Right/Left Heart Cath and Coronary Angiography;  Surgeon: [name], MD;  Location: [place] [facility];  Service: Cardiovascular;   Laterality: N/A;  . COLONOSCOPY WITH PROPOFOL N/A 12/15/2015   Procedure: COLONOSCOPY WITH PROPOFOL;  Surgeon: [name], MD;  Location: [facility];  Service: Endoscopy;  Laterality: N/A;  . RIGHT HEART CATH N/A 10/01/2019   Procedure: RIGHT HEART CATH;  Surgeon: [name], MD;  Location: [facility];  Service: Cardiovascular;  Laterality: N/A;  . THYROIDECTOMY  09/19/2016  . THYROIDECTOMY N/A 09/19/2016   Procedure: TOTAL THYROIDECTOMY;  Surgeon: [name], MD;  Location: [facility];  Service: General;  Laterality: N/A;  . TONSILLECTOMY    . TOTAL ABDOMINAL HYSTERECTOMY  07/14/10    There were no vitals filed for this visit.    Subjective Assessment - 05/04/20 1424    Subjective "My feet, my leg, and my right thigh feel like someone done burnt my leg. Then it will jump over here to the left one. I got the gout and arthritis. My feet been hurting for a minute."    Pertinent History See PMH above    Limitations Sitting;Lifting;Standing;Walking;House hold activities    How long can you sit comfortably? "It never feels comfortable"    How long can you stand comfortably? "It never feels comfortable"    How long can you walk comfortably? "It never feels comfortable"    Patient Stated Goals "Just to take care of my leg first"    Currently  in Pain? Yes    Pain Score 10-Worst pain ever   denied pain that requires going to the hospital but states "it just always hurts like this"   Pain Location Foot    Pain Orientation Right;Left    Pain Descriptors / Indicators --   "just a pain will come in there"   Pain Type Chronic pain    Pain Radiating Towards numbness/tingling in R thigh "all the time"    Pain Onset More than a month ago    Pain Frequency Constant    Aggravating Factors  "hurts all the time"    Pain Relieving Factors "I have to keep a blanket and big quilts on my right leg"    Effect of Pain on Daily Activities Has difficulty with daily tasks              [facility] PT  Assessment - 05/05/20 0001      Assessment   Medical Diagnosis Plantar fasciitis, left (M72.2), Plantar fasciitis, right (M72.2), Pes planus of both feet (M21.41, M21.42)    Referring Provider (PT) Criselda Peaches, DPM (03/17/2020)    Onset Date/Surgical Date --   it's been a minute   Hand Dominance Right    Next MD Visit Nothing scheduled yet    Prior Therapy Yes, it's been years      Precautions   Precautions Other (comment)   Portable O2 at 3 L/min   Precaution Comments O2 75% and HR 106 bpm upon arrival due to patient turning off oxygen after sitting down in therapy gym - pt advised to turn portable O2 back on. Portable oxygen set to 3 L/min by patient. After 2 min seated with cues for breathing, O2 93% and HR 96 bpm      Restrictions   Weight Bearing Restrictions No      Balance Screen   Has the patient fallen in the past 6 months Yes    How many times? At least 1 outside but several in her home    Has the patient had a decrease in activity level because of a fear of falling?  Yes    Is the patient reluctant to leave their home because of a fear of falling?  Yes      [place] residence    Living Arrangements Alone    Available Help at Discharge Other (Comment)   None   Type of Home Apartment    Home Access Level entry    [place] - single point;Walker - 2 wheels;Shower seat    Additional Comments Arrived with [facility] today but states she primarily uses RW for ambulation. Has shower chair but needs a larger one.      Prior Function   Level of Independence Independent    Vocation Other (comment)   "I don't work"   Leisure "I want my leg to be right"      Cognition   Overall Cognitive Status Difficult to assess      Observation/Other Assessments   Focus on Therapeutic Outcomes (FOTO)  No FOTO - MCD      AROM   Overall AROM Comments Pain with R hip AROM and difficulty performing seated R hip FL       Strength   Overall Strength Comments MMT testing limited and painful due to increased irritability of R thigh pain and pt being emotional due to personal circumstances  Right Hip Flexion 3-/5    Right Hip Extension 3/5    Right Hip ABduction 3+/5    Right Hip ADduction 3+/5    Left Hip Flexion 4-/5    Left Hip Extension 4-/5    Left Hip ABduction 4-/5    Left Hip ADduction 4-/5    Right Knee Flexion 3+/5    Right Knee Extension 3+/5    Left Knee Flexion 4-/5    Left Knee Extension 4-/5    Right Ankle Dorsiflexion 4-/5    Right Ankle Plantar Flexion 4-/5   modified test in sitting   Left Ankle Dorsiflexion 4-/5    Left Ankle Plantar Flexion 4-/5   modified test in sitting                     Objective measurements completed on examination: See above findings.               PT Education - 05/05/20 1810    Education Details Advised patient to return to doctor due to severe R thigh pain. Anatomy of condition regarding bilateral plantar fasciitis. POC.    Person(s) Educated Patient    Methods Explanation;Demonstration    Comprehension Verbalized understanding;Need further instruction            PT Short Term Goals - 05/05/20 1813      PT SHORT TERM GOAL #1   Title Patient will be independent with initial HEP.    Baseline Plan to establish initial HEP at 2nd visit.    Time 3    Period Weeks    Status New    Target Date 05/26/20      PT SHORT TERM GOAL #2   Title Patient will be able to ambulate 50 feet with </= 7/10 pain in bilateral feet.    Baseline 10/10 bilateral feet pain at rest during evaluation    Time 3    Period Weeks    Status New    Target Date 05/26/20      PT SHORT TERM GOAL #3   Title Patient will report </= 8/10 bilateral feet pain at rest.    Baseline 10/10 pain at rest    Time 3    Period Weeks    Status New    Target Date 05/26/20      PT SHORT TERM GOAL #4   Title Patient will increase R hip FL to at least 3/5 upon  MMT reassessment.    Baseline 3-/5    Time 3    Period Weeks    Status New    Target Date 05/26/20             PT Long Term Goals - 05/05/20 1815      PT LONG TERM GOAL #1   Title Patient will be independent with advanced HEP.    Baseline Plan to establish initial HEP at 2nd visit.    Time 6    Period Weeks    Status New    Target Date 06/16/20      PT LONG TERM GOAL #2   Title Patient will be able to ambulate 100 feet with </= 7/10 pain in bilateral feet.    Baseline 10/10 pain at rest during evaluation    Time 6    Period Weeks    Status New    Target Date 06/16/20      PT LONG TERM GOAL #3   Title Patient will increase BLE MMT to  at least 4/5 grossly    Baseline See flowsheet    Time 6    Period Weeks    Status New    Target Date 06/16/20      PT LONG TERM GOAL #4   Title Patient will report </= 6/10 bilateral feet pain at rest.    Baseline 10/10 at rest during evaluation    Time 6    Period Weeks    Status New    Target Date 06/16/20                  Plan - 05/05/20 1823    Clinical Impression Statement Patient is a 60 year old female who presents to [facility] with referral of bilateral plantar fasciitis and pes planus from 03/17/2021. Pt with decreased O2 % at beginning of session that recovered to > 90% after turning portable O2 back on and performing DBE (see flowsheet. Upon arrival, patient's chief complaint is significant R thigh pain that is aggravated during evaluation. Pt requested sheet to place over R thigh and states that her symptoms worsen with colder temperatures and if she does not have blanket over her R thigh. She states she does have pain in bilateral feet, legs, and thigh "all the time" with no easing factors. Pt is very frustrated and tearful throughout evaluation because she feels helpless and distraught with her current living situation, personal matters, and constant pain. She explains that her first cousin was recently murdered, and she  is very emotional about it as would be expected. Pt also does not like to answer questions regarding her symptoms but PT gently informed patient that it is necessary to obtain some information to allow for better understanding of patient's pain and allow for appropriate treatment for which she verbalized understanding. Throughout session, patient was very emotional thinking about her current living situation and feeling unsafe with her neighbors being involved in drug activity and potentially "watching" her per her suspicion. Assessment was very limited due to above information but will plan to further assess next session within patient's tolerance. She was advised to return to doctor regarding significant R thigh pain. PT trial to see if patient has any improvement with bilateral plantar fasciitis and pes planus but may be limited secondary to several factors mentioned above. If patient's other conditions and emotional distress are addressed, patient may not be appropriate for effective skilled PT at this time - stated goals and POC pending status after patient follows up with PCP.    Personal Factors and Comorbidities Age;Comorbidity 3+;Fitness;Past/Current Experience;Time since onset of injury/illness/exacerbation    Comorbidities See PMH above    Examination-Activity Limitations Bathing;Bed Mobility;Bend;Carry;Dressing;Lift;Locomotion Level;Sit;Sleep;Squat;Stairs;Stand;Toileting   "pain all the time"   Examination-Participation Restrictions Cleaning;Community Activity;Driving;Meal Prep;Shop;Laundry    Stability/Clinical Decision Making Unstable/Unpredictable    Clinical Decision Making High    Rehab Potential Poor    PT Frequency 1x / week    PT Duration 6 weeks    PT Treatment/Interventions ADLs/Self Care Home Management;Aquatic Therapy;Cryotherapy;Electrical Stimulation;Iontophoresis 4mg /ml Dexamethasone;Moist Heat;Traction;Neuromuscular re-education;Balance training;Therapeutic exercise;Therapeutic  activities;Functional mobility training;Stair training;Gait training;Patient/family education;Manual techniques;Energy conservation;Dry needling;Passive range of motion;Taping    PT Next Visit Plan Attempt further assessment per tolerance - B ankle A/PROM objective measurements, plantar fascia and gastroc-soleus STM and stretching, foot instrinsic and tibialis anterior/ankle strengthening. Establish HEP.    PT Home Exercise Plan Will plan to establish at 2nd visit    Recommended Other Services Recommended that patient follow up with her doctor due to significant pain  in R thigh and to address emotional distress.    Consulted and Agree with Plan of Care Patient           Patient will benefit from skilled therapeutic intervention in order to improve the following deficits and impairments:  Decreased activity tolerance,Decreased balance,Decreased mobility,Decreased strength,Decreased endurance,Decreased range of motion,Abnormal gait,Difficulty walking,Increased muscle spasms,Impaired sensation,Improper body mechanics,Obesity  Visit Diagnosis: Pain in left foot - Plan: PT plan of care cert/re-cert  Pain in right foot - Plan: PT plan of care cert/re-cert  Difficulty in walking, not elsewhere classified - Plan: PT plan of care cert/re-cert  Muscle weakness (generalized) - Plan: PT plan of care cert/re-cert     Problem List Patient Active Problem List   Diagnosis Date Noted  . Acute on chronic respiratory failure with hypoxia and hypercapnia ([place]) 04/18/2020  . Acute on chronic respiratory failure with hypoxia ([place]) 04/18/2020  . Hemoptysis 04/18/2020  . Acute metabolic encephalopathy [id number]  . Acute encephalopathy 04/18/2020  . Chronic diastolic CHF (congestive heart failure) ([place]) 04/18/2020  . Sepsis ([place]) 04/18/2020  . Morbid obesity with BMI of 50.0-59.9, adult ([place])   . Fall at home, initial encounter 06/23/2018  . HNP (herniated nucleus pulposus), thoracic 06/23/2018  . CHF  (congestive heart failure) ([place]) 06/18/2018  . Hypertensive disorder 08/28/2017  . Obesity 08/28/2017  . COPD exacerbation ([place]) 08/04/2017  . Acute and chronic respiratory failure with hypercapnia ([place])   . Pulmonary HTN ([place])   . Genetic testing 02/27/2017  . Family history of thyroid cancer   . Hypomagnesemia 10/03/2016  . Atypical chest pain   . HLD (hyperlipidemia) 09/23/2016  . Gout 09/23/2016  . DVT (deep vein thrombosis) in pregnancy 09/23/2016  . Thyroid cancer ([place]) 09/23/2016  . Hypocalcemia 09/23/2016  . AKI (acute kidney injury) ([place]) 09/23/2016  . Acute pulmonary edema ([facility])   . Acute hypoxemic respiratory failure ([place])   . Ventilator dependent ([place])   . Chronic respiratory failure with hypoxia and hypercapnia ([place]) 07/16/2016  . CAP (community acquired pneumonia) 01/23/2016  . 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 Pulmonary hypertension ([place]) 01/23/2016  . Obesity hypoventilation syndrome ([place]) 08/26/2015  . Dyslipidemia associated with type 2 diabetes mellitus ([place]) 04/24/2015  . Leukocytosis 04/24/2015  . Controlled type 2 diabetes mellitus without complication, without long-term current use of insulin ([place]) 04/24/2015  . Anxiety 04/24/2015  . Benign essential HTN 04/24/2015  . Normocytic anemia 04/24/2015  . OSA (obstructive sleep apnea) 05/10/2012  . Tobacco abuse 07/04/2011   [employer] Authorization   Choose one: Rehabilitative  Standardized Assessment or Functional Outcome Tool: See Pain Assessment and N/A  Body Parts Treated (Select each separately):  1. Other foot. Overall deficits/functional limitations for body part selected: severe 2. N/A. Overall deficits/functional limitations for body part selected:  3. N/A. Overall deficits/functional limitations for body part selected:    Haydee Monica, PT, DPT 05/05/20 7:25 PM  [facility] [address] [place], [place],  [address] Phone: [phone]   Fax:  [phone]  Name: Michele Owens MRN: [id number] Date of Birth: [birth year]

## 2020-05-05 NOTE — Therapy (Signed)
[facility] [address] [place], [place], [address] Phone: [phone]   Fax:  [phone]  Physical Therapy Evaluation  Patient Details  Name: Lenice LlamasShelia Fortin MRN: [id number] Date of Birth: 11-19-1960 Referring Provider (PT): Edwin CapMcDonald, Adam R, DPM (03/17/2020)   Encounter Date: 05/04/2020   PT End of Session - 05/04/20 1408    Visit Number 1    Number of Visits 7    Date for PT Re-Evaluation 06/19/20    Authorization Type [facility] - submitted for auth 05/05/2020    PT Start Time 1409   pt arrived late   PT Stop Time 1449    PT Time Calculation (min) 40 min    Equipment Utilized During Treatment Oxygen    Activity Tolerance Patient tolerated treatment well    Behavior During Therapy [facility] for tasks assessed/performed           Past Medical History:  Diagnosis Date  . Anxiety   . Arthritis   . Asthma   . Chronic diastolic CHF (congestive heart failure) (HCC)   . COPD (chronic obstructive pulmonary disease) (HCC)    Uses Oxygen at night  . Depression   . Diabetes mellitus without complication (HCC)   . Dyspnea    occ  . Family history of thyroid cancer   . GERD (gastroesophageal reflux disease)   . Gout   . Headache    migraines  . History of DVT of lower extremity   . History of nuclear stress test    Myoview 2/17:  Low risk stress nuclear study with a small, moderate intensity, partially reversible inferior lateral defect consistent with small prior infarct and minimal peri-infarct ischemia; EF 68 with normal wall motion  . Hypertension   . Pneumonia   . Thyroid cancer (HCC) 09/23/2016    Past Surgical History:  Procedure Laterality Date  . BUNIONECTOMY Bilateral   . CARDIAC CATHETERIZATION N/A 06/23/2015   Procedure: Right/Left Heart Cath and Coronary Angiography;  Surgeon: Laurey Moralealton S McLean, MD;  [facility];  Service: Cardiovascular;  Laterality: N/A;  . COLONOSCOPY WITH PROPOFOL N/A  12/15/2015   Procedure: COLONOSCOPY WITH PROPOFOL;  Surgeon: Dorena CookeyJohn Hayes, MD;  [name];  Service: Endoscopy;  Laterality: N/A;  . RIGHT HEART CATH N/A 10/01/2019   Procedure: RIGHT HEART CATH;  Surgeon: Laurey MoraleMcLean, Dalton S, MD;  [facility];  Service: Cardiovascular;  Laterality: N/A;  . THYROIDECTOMY  09/19/2016  . THYROIDECTOMY N/A 09/19/2016   Procedure: TOTAL THYROIDECTOMY;  Surgeon: Darnell LevelGerkin, Todd, MD;  Location: [facility] OR;  Service: General;  Laterality: N/A;  . TONSILLECTOMY    . TOTAL ABDOMINAL HYSTERECTOMY  07/14/10    There were no vitals filed for this visit.    Subjective Assessment - 05/04/20 1424    Subjective "My feet, my leg, and my right thigh feel like someone done burnt my leg. Then it will jump over here to the left one. I got the gout and arthritis. My feet been hurting for a minute."    Pertinent History See PMH above    Limitations Sitting;Lifting;Standing;Walking;House hold activities    How long can you sit comfortably? "It never feels comfortable"    How long can you stand comfortably? "It never feels comfortable"    How long can you walk comfortably? "It never feels comfortable"    Patient Stated Goals "Just to take care of my leg first"    Currently in Pain? Yes    Pain  Score 10-Worst pain ever   denied pain that requires going to the hospital but states "it just always hurts like this"   Pain Location Foot    Pain Orientation Right;Left    Pain Descriptors / Indicators --   "just a pain will come in there"   Pain Type Chronic pain    Pain Radiating Towards numbness/tingling in R thigh "all the time"    Pain Onset More than a month ago    Pain Frequency Constant    Aggravating Factors  "hurts all the time"    Pain Relieving Factors "I have to keep a blanket and big quilts on my right leg"    Effect of Pain on Daily Activities Has difficulty with daily tasks              [facility] Assessment - 05/05/20 0001      Assessment   Medical  Diagnosis Plantar fasciitis, left (M72.2), Plantar fasciitis, right (M72.2), Pes planus of both feet (M21.41, M21.42)    Referring Provider (PT) Criselda Peaches, DPM (03/17/2020)    Onset Date/Surgical Date --   it's been a minute   Hand Dominance Right    Next MD Visit Nothing scheduled yet    Prior Therapy Yes, it's been years      Precautions   Precautions Other (comment)   Portable O2 at 3 L/min   Precaution Comments O2 75% and HR 106 bpm upon arrival. Portable oxygen set to 3 L/min. After 2 min seated with cues for breathing, O2 93% and HR 96 bpm      Restrictions   Weight Bearing Restrictions No      Balance Screen   Has the patient fallen in the past 6 months Yes    How many times? At least 1 outside but several in her home    Has the patient had a decrease in activity level because of a fear of falling?  Yes    Is the patient reluctant to leave their home because of a fear of falling?  Yes      [place] residence    Living Arrangements Alone    Available Help at Discharge Other (Comment)   None   Type of Home Apartment    Home Access Level entry    [place] - single point;Walker - 2 wheels;Shower seat    Additional Comments Arrived with [facility] today but states she primarily uses RW for ambulation. Has shower chair but needs a larger one.      Prior Function   Level of Independence Independent    Vocation Other (comment)   "I don't work"   Leisure "I want my leg to be right"      Cognition   Overall Cognitive Status Difficult to assess      Observation/Other Assessments   Focus on Therapeutic Outcomes (FOTO)  No FOTO - MCD      AROM   Overall AROM Comments Pain with R hip AROM and difficulty performing seated R hip FL      Strength   Overall Strength Comments MMT testing limited and painful due to increased irritability of R thigh pain and pt being emotional due to personal circumstances     Right Hip Flexion 3-/5    Right Hip Extension 3/5    Right Hip ABduction 3+/5    Right Hip ADduction 3+/5    Left Hip  Flexion 4-/5    Left Hip Extension 4-/5    Left Hip ABduction 4-/5    Left Hip ADduction 4-/5    Right Knee Flexion 3+/5    Right Knee Extension 3+/5    Left Knee Flexion 4-/5    Left Knee Extension 4-/5    Right Ankle Dorsiflexion 4-/5    Right Ankle Plantar Flexion 4-/5   modified test in sitting   Left Ankle Dorsiflexion 4-/5    Left Ankle Plantar Flexion 4-/5   modified test in sitting                     Objective measurements completed on examination: See above findings.               PT Education - 05/05/20 1810    Education Details Advised patient to return to doctor due to severe R thigh pain. Anatomy of condition regarding bilateral plantar fasciitis. POC.    Person(s) Educated Patient    Methods Explanation;Demonstration    Comprehension Verbalized understanding;Need further instruction            PT Short Term Goals - 05/05/20 1813      PT SHORT TERM GOAL #1   Title Patient will be independent with initial HEP.    Baseline Plan to establish initial HEP at 2nd visit.    Time 3    Period Weeks    Status New    Target Date 05/26/20      PT SHORT TERM GOAL #2   Title Patient will be able to ambulate 50 feet with </= 7/10 pain in bilateral feet.    Baseline 10/10 bilateral feet pain at rest during evaluation    Time 3    Period Weeks    Status New    Target Date 05/26/20      PT SHORT TERM GOAL #3   Title Patient will report </= 8/10 bilateral feet pain at rest.    Baseline 10/10 pain at rest    Time 3    Period Weeks    Status New    Target Date 05/26/20      PT SHORT TERM GOAL #4   Title Patient will increase R hip FL to at least 3/5 upon MMT reassessment.    Baseline 3-/5    Time 3    Period Weeks    Status New    Target Date 05/26/20             PT Long Term Goals - 05/05/20 1815      PT LONG  TERM GOAL #1   Title Patient will be independent with advanced HEP.    Baseline Plan to establish initial HEP at 2nd visit.    Time 6    Period Weeks    Status New    Target Date 06/16/20      PT LONG TERM GOAL #2   Title Patient will be able to ambulate 100 feet with </= 7/10 pain in bilateral feet.    Baseline 10/10 pain at rest during evaluation    Time 6    Period Weeks    Status New    Target Date 06/16/20      PT LONG TERM GOAL #3   Title Patient will increase BLE MMT to at least 4/5 grossly    Baseline See flowsheet    Time 6    Period Weeks    Status New    Target Date  06/16/20      PT LONG TERM GOAL #4   Title Patient will report </= 6/10 bilateral feet pain at rest.    Baseline 10/10 at rest during evaluation    Time 6    Period Weeks    Status New    Target Date 06/16/20                  Plan - 05/05/20 1823    Clinical Impression Statement Patient is a 60 year old female who presents to [facility] with referral of bilateral plantar fasciitis and pes planus from 03/17/2021. Upon arrival, patient's chief complaint is significant R thigh pain that is aggravated during evaluation. Pt requested sheet to place over R thigh and states that her symptoms worsen with colder temperatures and if she does not have blanket over her R thigh. She states she does have pain in bilateral feet, legs, and thigh "all the time" with no easing factors. Pt is very frustrated and tearful throughout evaluation because she feels helpless and distraught with her current living situation, personal matters, and constant pain. She explains that her first cousin was recently murdered, and she is very emotional about it as would be expected. Pt also does not like to answer questions regarding her symptoms but PT gently informed patient that it is necessary to obtain some information to allow for better understanding of patient's pain and allow for appropriate treatment for which she verbalized  understanding. Throughout session, patient was very emotional thinking about her current living situation and feeling unsafe with her neighbors being involved in drug activity and potentially "watching" her per her suspicion. Assessment was very limited due to above information but will plan to further assess next session within patient's tolerance. She was advised to return to doctor regarding significant R thigh pain. PT trial to see if patient has any improvement with bilateral plantar fasciitis and pes planus but may be limited secondary to several factors mentioned above.    Personal Factors and Comorbidities Age;Comorbidity 3+;Fitness;Past/Current Experience;Time since onset of injury/illness/exacerbation    Comorbidities See PMH above    Examination-Activity Limitations Bathing;Bed Mobility;Bend;Carry;Dressing;Lift;Locomotion Level;Sit;Sleep;Squat;Stairs;Stand;Toileting   "pain all the time"   Examination-Participation Restrictions Cleaning;Community Activity;Driving;Meal Prep;Shop;Laundry    Stability/Clinical Decision Making Unstable/Unpredictable    Clinical Decision Making High    Rehab Potential Poor    PT Frequency 1x / week    PT Duration 6 weeks    PT Treatment/Interventions ADLs/Self Care Home Management;Aquatic Therapy;Cryotherapy;Electrical Stimulation;Iontophoresis 4mg /ml Dexamethasone;Moist Heat;Traction;Neuromuscular re-education;Balance training;Therapeutic exercise;Therapeutic activities;Functional mobility training;Stair training;Gait training;Patient/family education;Manual techniques;Energy conservation;Dry needling;Passive range of motion;Taping    PT Next Visit Plan Attempt further assessment per tolerance - B ankle A/PROM objective measurements, plantar fascia and gastroc-soleus STM and stretching, foot instrinsic and tibialis anterior/ankle strengthening. Establish HEP.    PT Home Exercise Plan Will plan to establish at 2nd visit    Recommended Other Services Recommended  that patient follow up with her doctor due to significant pain in R thigh.    Consulted and Agree with Plan of Care Patient           Patient will benefit from skilled therapeutic intervention in order to improve the following deficits and impairments:  Decreased activity tolerance,Decreased balance,Decreased mobility,Decreased strength,Decreased endurance,Decreased range of motion,Abnormal gait,Difficulty walking,Increased muscle spasms,Impaired sensation,Improper body mechanics,Obesity  Visit Diagnosis: Pain in left foot  Pain in right foot  Difficulty in walking, not elsewhere classified  Muscle weakness (generalized)     Problem List Patient Active  Problem List   Diagnosis Date Noted  . Acute on chronic respiratory failure with hypoxia and hypercapnia ([place]) 04/18/2020  . Acute on chronic respiratory failure with hypoxia ([place]) 04/18/2020  . Hemoptysis 04/18/2020  . Acute metabolic encephalopathy 27/25/3664  . Acute encephalopathy 04/18/2020  . Chronic diastolic CHF (congestive heart failure) ([place]) 04/18/2020  . Sepsis ([place]) 04/18/2020  . Morbid obesity with BMI of 50.0-59.9, adult ([place])   . Fall at home, initial encounter 06/23/2018  . HNP (herniated nucleus pulposus), thoracic 06/23/2018  . CHF (congestive heart failure) ([place]) 06/18/2018  . Hypertensive disorder 08/28/2017  . Obesity 08/28/2017  . COPD exacerbation ([place]) 08/04/2017  . Acute and chronic respiratory failure with hypercapnia ([place])   . Pulmonary HTN ([place])   . Genetic testing 02/27/2017  . Family history of thyroid cancer   . Hypomagnesemia 10/03/2016  . Atypical chest pain   . HLD (hyperlipidemia) 09/23/2016  . Gout 09/23/2016  . DVT (deep vein thrombosis) in pregnancy 09/23/2016  . Thyroid cancer ([place]) 09/23/2016  . Hypocalcemia 09/23/2016  . AKI (acute kidney injury) ([place]) 09/23/2016  . Acute pulmonary edema (HCC)   . Acute hypoxemic respiratory failure ([place])   . Ventilator dependent ([place])   .  Chronic respiratory failure with hypoxia and hypercapnia ([place]) 07/16/2016  . CAP (community acquired pneumonia) 01/23/2016  . 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 Pulmonary hypertension ([place]) 01/23/2016  . Obesity hypoventilation syndrome ([place]) 08/26/2015  . Dyslipidemia associated with type 2 diabetes mellitus ([place]) 04/24/2015  . Leukocytosis 04/24/2015  . Controlled type 2 diabetes mellitus without complication, without long-term current use of insulin ([place]) 04/24/2015  . Anxiety 04/24/2015  . Benign essential HTN 04/24/2015  . Normocytic anemia 04/24/2015  . OSA (obstructive sleep apnea) 05/10/2012  . Tobacco abuse 07/04/2011    Georges Mouse 05/05/2020, 7:05 PM  [facility] [address] [place], [place], [address] Phone: [phone]   Fax:  [phone]  Name: Yumna Ebers MRN: [id number] Date of Birth: 1961-02-13

## 2020-05-06 ENCOUNTER — Telehealth: Payer: Self-pay

## 2020-05-06 NOTE — Telephone Encounter (Signed)
   Telephone encounter was:  Successful.  05/06/2020 Name: Michele Owens MRN: [id number] DOB: 29-Apr-1960  Michele Owens is a 60 y.o. year old female who is a primary care patient of Dionne Milo, [name], MD . The community resource team was consulted for assistance with Transportation Needs   Care guide performed the following interventions: Patient provided with information about care guide support team and interviewed to confirm resource needs Patient stated that she did not need transportation and did not have any other needs at this time..  Follow Up Plan:  No further follow up planned at this time. The patient has been provided with needed resources. and Patient refused services.  Seena Face, AAS Paralegal, [facility] Lopez de Gutierrez . Embedded Care Coordination [facility]  Care Management  [address], [place] [address] [email or web address]  ?? [phone]   [email or web address]

## 2020-05-07 ENCOUNTER — Other Ambulatory Visit: Payer: Self-pay

## 2020-05-11 DIAGNOSIS — Z7689 Persons encountering health services in other specified circumstances: Secondary | ICD-10-CM | POA: Diagnosis not present

## 2020-05-11 DIAGNOSIS — Z9981 Dependence on supplemental oxygen: Secondary | ICD-10-CM | POA: Diagnosis not present

## 2020-05-11 DIAGNOSIS — J189 Pneumonia, unspecified organism: Secondary | ICD-10-CM | POA: Diagnosis not present

## 2020-05-11 DIAGNOSIS — Z419 Encounter for procedure for purposes other than remedying health state, unspecified: Secondary | ICD-10-CM | POA: Diagnosis not present

## 2020-05-11 DIAGNOSIS — F17290 Nicotine dependence, other tobacco product, uncomplicated: Secondary | ICD-10-CM | POA: Diagnosis not present

## 2020-05-11 DIAGNOSIS — E119 Type 2 diabetes mellitus without complications: Secondary | ICD-10-CM | POA: Diagnosis not present

## 2020-05-12 ENCOUNTER — Other Ambulatory Visit: Payer: Medicaid Other | Admitting: Obstetrics and Gynecology

## 2020-05-12 ENCOUNTER — Other Ambulatory Visit: Payer: Self-pay

## 2020-05-12 NOTE — Patient Outreach (Signed)
Medicaid Managed Care   Nurse Care Manager Note  05/12/2020 Name:  Michele Owens MRN:  [id number] DOB:  1960/06/21  Michele Owens is an 60 y.o. year old female who is a primary patient of [name], [name], MD.  The [facility] Managed Care Coordination team was consulted for assistance with:    chronic healthcare management needs.  Michele Owens was given information about Medicaid Managed Care Coordination team services today. Michele Owens agreed to services and verbal consent obtained.  Engaged with patient by telephone for follow up visit in response to provider referral for case management and/or care coordination services.   Assessments/Interventions:  Review of past medical history, allergies, medications, health status, including review of consultants reports, laboratory and other test data, was performed as part of comprehensive evaluation and provision of chronic care management services.  SDOH (Social Determinants of Health) assessments and interventions performed: SDOH Interventions   Flowsheet Row Most Recent Value  SDOH Interventions   Transportation Interventions --  [patient given [employer] transportation number-has used them and likes them]      Care Plan  Allergies  Allergen Reactions  . Bee Venom Swelling and Other (See Comments)    "All over my body" (swelling)  . Fioricet [Butalbital-Apap-Caffeine] Nausea And Vomiting and Rash  . Ibuprofen Rash and Other (See Comments)    Severe rash  . Lamisil [Terbinafine] Rash and Other (See Comments)    Pt states this causes her to "feel funny"  . Nsaids Other (See Comments)    Per MD's orders     Medications Reviewed Today    Reviewed by [name], RN (Registered Nurse) on 05/12/20 at 41  Med List Status: <None>  Medication Order Taking? Sig Documenting Provider Last Dose Status Informant  albuterol (PROVENTIL HFA;VENTOLIN HFA) 108 (90 Base) MCG/ACT inhaler [id number] No Inhale 1-2 puffs into the lungs every 6 (six)  hours as needed for wheezing or shortness of breath. Long, [name], MD unk Active Multiple Informants  allopurinol (ZYLOPRIM) 100 MG tablet [id number] No Take 100 mg by mouth daily. [provider] 04/17/2020 Active Multiple Informants           Med Note [name], [name]   Tue Oct 23, 2017  2:58 AM)    [name] ELLIPTA 62.5-25 MCG/INH AEPB [id number] No Inhale 1 puff into the lungs daily.  [provider] 04/18/2020 Unknown time Active Multiple Informants  benzonatate (TESSALON) 200 MG capsule [id number] No Take 1 capsule (200 mg total) by mouth in the morning, at Owens, and at bedtime. 7 DS Niel Hummer A, MD 04/16/2020 Active Multiple Informants  Blood Glucose Monitoring Suppl (ACCU-CHEK AVIVA PLUS) w/Device KIT [id number] No 1 each by Other route daily.  [provider] unk unk Active Multiple Informants  calcitRIOL (ROCALTROL) 0.5 MCG capsule [id number] No Take 1 capsule (0.5 mcg total) by mouth daily. Modena Jansky, MD 04/17/2020 Active Multiple Informants           Med Note [name], [name]   Tue Oct 23, 2017  2:59 AM)    carvedilol (COREG) 6.25 MG tablet [id number] No Take 1 tablet (6.25 mg total) by mouth 2 (two) times daily with a meal. Bensimhon, [name], MD 04/17/2020 1700 Active Multiple Informants  clonazePAM (KLONOPIN) 2 MG tablet [id number] No Take 1 tablet (2 mg total) by mouth 3 (three) times daily. for anxiety [name], MD 04/18/2020 Unknown time Active Multiple Informants  cyclobenzaprine (FLEXERIL) 10 MG tablet [id number] No Take 10 mg by mouth  3 (three) times daily. [provider] 04/17/2020 Active Multiple Informants  diclofenac sodium (VOLTAREN) 1 % GEL [id number] No Apply 2 g topically 4 (four) times daily as needed (for pain). [name], MD Past Week Unknown time Active Multiple Informants  EPINEPHrine 0.3 mg/0.3 mL IJ SOAJ injection [id number] No Inject 0.3 mg into the muscle daily as needed (allergic reaction). [provider] unk  Active Multiple Informants  FEROSUL 325 (65 Fe) MG tablet [id number] No Take 325 mg by mouth daily. [provider] 04/17/2020 Active Multiple Informants           Med Note [name], [name]   Tue Oct 23, 2017  2:57 AM)    glucose blood test strip [id number] No Use as instructed to check blood sugar up to 4 times daily  Patient taking differently: 1 each by Other route See admin instructions. Use as instructed to check blood sugar up to 4 times daily   [name], DO unk unk Active Multiple Informants  guaiFENesin (MUCINEX) 600 MG 12 hr tablet [id number] No Take 2 tablets (1,200 mg total) by mouth 2 (two) times daily. Regalado, Belkys A, MD unk Active Multiple Informants  ipratropium-albuterol (DUONEB) 0.5-2.5 (3) MG/3ML SOLN [id number] No Take 3 mLs by nebulization 4 (four) times daily.  [provider] unk Active Multiple Informants           Med Note [name], [name]   Tue Oct 23, 2017  2:57 AM)    levothyroxine (SYNTHROID, LEVOTHROID) 112 MCG tablet [id number] No Take 224 mcg by mouth daily. [provider] 04/18/2020 Unknown time Active Multiple Informants           Med Note [name], [name]   Tue Oct 23, 2017  2:57 AM)    lisinopril (PRINIVIL,ZESTRIL) 5 MG tablet [id number] No Take 1 tablet (5 mg total) by mouth daily. Please call for office visit [phone] [name], MD 04/18/2020 Unknown time Active Multiple Informants           Med Note [name], [name]   Tue Oct 23, 2017  2:58 AM)    loperamide (IMODIUM) 2 MG capsule [id number] No Take 1 capsule (2 mg total) by mouth 4 (four) times daily as needed for diarrhea or loose stools. [name], MD unk Active Multiple Informants  metFORMIN (GLUCOPHAGE) 500 MG tablet [id number] No Take 1 tablet (500 mg total) by mouth 2 (two) times daily with a meal. [name], MD 04/17/2020 Active Multiple Informants           Med Note [name], [name]   Tue Oct 23, 2017  2:58 AM)    nitroGLYCERIN (NITROSTAT) 0.4 MG SL  tablet [id number] No Place 1 tablet (0.4 mg total) under the tongue every 5 (five) minutes as needed for chest pain. [name], MD unk Active Multiple Informants  nystatin cream (MYCOSTATIN) [id number] No Apply 1 application topically 2 (two) times daily as needed for dry skin. Regalado, Belkys A, MD 04/16/2020 Active Multiple Informants  omeprazole (PRILOSEC) 20 MG capsule [id number] No Take 20 mg by mouth 2 (two) times daily before a meal. [provider] 04/17/2020 Active Multiple Informants  oxyCODONE-acetaminophen (PERCOCET) 10-325 MG tablet [id number] No Take 1 tablet by mouth every 6 (six) hours as needed for pain. [provider] 04/17/2020 Active Multiple Informants           Med Note Marcellina Millin Apr 12, 2020  1:22 PM) LF on 03-25-20 #  120 DS 30  OXYGEN [id number] No Inhale 2-3 L into the lungs continuous. [provider] 01/14/2020 Unknown time Active Multiple Informants           Med Note Corky Mull   Mon Apr 12, 2020 10:08 AM)    potassium chloride SA (K-DUR,KLOR-CON) 20 MEQ tablet [id number] No Take 1 tablet (20 mEq total) by mouth 2 (two) times daily. [name], MD 04/17/2020 Active Multiple Informants  QC STOOL SOFTENER PLS LAXATIVE 8.6-50 MG tablet [id number] No Take 1 tablet by mouth at bedtime. [provider] 04/17/2020 Active Multiple Informants           Med Note [name], [name]   Tue Oct 23, 2017  2:56 AM)    rosuvastatin (CRESTOR) 10 MG tablet [id number] No Take 10 mg by mouth every evening. [provider] 04/16/2020 Active Multiple Informants  torsemide (DEMADEX) 20 MG tablet [id number] No TAKE 4 (FOUR) TABLETS BY MOUTH DAILY (2AM+ 2NOON)  Patient taking differently: Take 80 mg by mouth 2 (two) times daily.   [name], MD 04/18/2020 Unknown time Active Multiple Informants  traZODone (DESYREL) 50 MG tablet [id number] No Take 100 mg by mouth at bedtime. [provider] 04/17/2020 Active Multiple Informants           Patient Active Problem List   Diagnosis Date Noted  . Acute on chronic respiratory failure with hypoxia and hypercapnia ([place]) 04/18/2020  . Acute on chronic respiratory failure with hypoxia ([place]) 04/18/2020  . Hemoptysis 04/18/2020  . Acute metabolic encephalopathy 37/07/8887  . Acute encephalopathy 04/18/2020  . Chronic diastolic CHF (congestive heart failure) ([place]) 04/18/2020  . Sepsis ([place]) 04/18/2020  . Morbid obesity with BMI of 50.0-59.9, adult ([place])   . Fall at home, initial encounter 06/23/2018  . HNP (herniated nucleus pulposus), thoracic 06/23/2018  . CHF (congestive heart failure) ([place]) 06/18/2018  . Hypertensive disorder 08/28/2017  . Obesity 08/28/2017  . COPD exacerbation ([place]) 08/04/2017  . Acute and chronic respiratory failure with hypercapnia ([place])   . Pulmonary HTN ([place])   . Genetic testing 02/27/2017  . Family history of thyroid cancer   . Hypomagnesemia 10/03/2016  . Atypical chest pain   . HLD (hyperlipidemia) 09/23/2016  . Gout 09/23/2016  . DVT (deep vein thrombosis) in pregnancy 09/23/2016  . Thyroid cancer ([place]) 09/23/2016  . Hypocalcemia 09/23/2016  . AKI (acute kidney injury) ([place]) 09/23/2016  . Acute pulmonary edema ([facility])   . Acute hypoxemic respiratory failure ([place])   . Ventilator dependent ([place])   . Chronic respiratory failure with hypoxia and hypercapnia ([place]) 07/16/2016  . CAP (community acquired pneumonia) 01/23/2016  . 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 Pulmonary hypertension ([place]) 01/23/2016  . Obesity hypoventilation syndrome ([place]) 08/26/2015  . Dyslipidemia associated with type 2 diabetes mellitus ([place]) 04/24/2015  . Leukocytosis 04/24/2015  . Controlled type 2 diabetes mellitus without complication, without long-term current use of insulin ([place]) 04/24/2015  . Anxiety 04/24/2015  . Benign essential HTN 04/24/2015  . Normocytic anemia 04/24/2015  . OSA (obstructive sleep apnea) 05/10/2012  .  Tobacco abuse 07/04/2011    Conditions to be addressed/monitored per PCP order:  chronic healthcare mangement needs, anxiety, obesity, pulmonary HTN  Care Plan : General Plan of Care (Adult)  Updates made by [name], RN since 05/12/2020 12:00 AM    Problem: Barriers to Treatment     Long-Range Goal: Barriers to Treatment Identified and Managed   Start Date: 05/03/2020  Expected End Date: 08/01/2020  Recent Progress: Not on track  Priority: High  Note:   CARE PLAN ENTRY Medicaid Managed Care (see longitudinal plan of care for additional care plan information)  Current Barriers:  . Transportation barriers.  Nurse Clinical Goal(s):  Marland Kitchen Over the next 30 days, patient will work with [employer] to address needs related to transportation. . Over the next day  patient will follow up with [place] as directed by [facility]. Marland Kitchen Update 05/12/20:  Patient has used [employer] and is very pleased. . Over the next 90 days, patient will attend all scheduled medical appointments.  Interventions: . Inter-disciplinary care team collaboration (see longitudinal plan of care) . Referred patient to community resources care guide team for assistance with transportation. . Provided patient with information about [employer]. . Discussed plans with patient for ongoing care management follow up and provided patient with direct contact information for care management team . Advised patient to call [place]. Marland Kitchen Collaborated with [facility] regarding transportation. . Assisted patient/caregiver with obtaining information about health plan benefits  Patient Self Care Activities:  . Patient will call [employer].     Follow Up:  Patient agrees to Care Plan and Follow-up.  Plan: The Managed Medicaid care management team will reach out to the patient again over the next 14 days. and The patient has been provided with contact information  for the Managed Medicaid care management team and has been advised to call with any health related questions or concerns.  Date/time of next scheduled RN care management/care coordination outreach:  05/24/20 at 1030

## 2020-05-12 NOTE — Patient Instructions (Addendum)
Visit Information  Michele Owens was given information about Medicaid Managed Care team care coordination services as a part of their [facility] Medicaid benefit. Michele Owens verbally consented to engagement with the [facility] Managed Care team.   For questions related to your [facility] health plan, please call: [phone]  If you would like to schedule transportation through your [facility] plan, please call the following number at least 2 days in advance of your appointment: [phone]  Michele Owens - following are the goals we discussed in your visit today:  Goals Addressed            This Visit's Progress   . Protect My Health       Timeframe:  Long-Range Goal Priority:  Medium Start Date:    04/23/20                         Expected End Date:       07/22/20                Follow Up Date 05/24/20   - schedule appointment for vaccines needed due to my age or health - schedule recommended health tests (blood work, mammogram, colonoscopy, pap test) - schedule and keep appointment for annual check-up  Patient has PT appointment 05/19/20.       Patient verbalizes understanding of instructions provided today.   The Managed Medicaid care management team will reach out to the patient again over the next 14 days.  The patient has been provided with contact information for the Managed Medicaid care management team and has been advised to call with any health related questions or concerns.   [name] RN, BSN [place]  [facility]ator - Managed Medicaid High Risk [phone].  Following is a copy of your plan of care:  Patient Care Plan: General Plan of Care (Adult)    Problem Identified: Health Promotion or Disease Self-Management (General Plan of Care)   Priority: Medium  Onset Date: 04/23/2020    Problem Identified: Barriers to Treatment     Long-Range Goal: Barriers to Treatment Identified and Managed   Start Date:  05/03/2020  Expected End Date: 08/01/2020  Recent Progress: Not on track  Priority: High  Note:   CARE PLAN ENTRY Medicaid Managed Care (see longitudinal plan of care for additional care plan information)  Current Barriers:  . Transportation barriers.  Nurse Clinical Goal(s):  Marland Kitchen Over the next 30 days, patient will work with [employer] to address needs related to transportation. . Over the next day  patient will follow up with [place] as directed by [facility]. Marland Kitchen Update 05/12/20:  Patient has used [employer] and is very pleased. . Over the next 90 days, patient will attend all scheduled medical appointments.  Interventions: . Inter-disciplinary care team collaboration (see longitudinal plan of care) . Referred patient to community resources care guide team for assistance with transportation. . Provided patient with information about [employer]. . Discussed plans with patient for ongoing care management follow up and provided patient with direct contact information for care management team . Advised patient to call [place]. Marland Kitchen Collaborated with [facility] regarding transportation. . Assisted patient/caregiver with obtaining information about health plan benefits  Patient Self Care Activities:  . Patient will call [employer].           Patient Care Plan: General Plan of Care (Adult)    Problem Identified: Health Promotion or Disease  Self-Management (General Plan of Care)     Long-Range Goal: Self-Management Plan Developed   Start Date: 04/23/2020  Expected End Date: 07/22/2020  This Visit's Progress: Not on track  Priority: Medium  Note:   Current Barriers:  . Chronic Disease Management support and education needs.  Nurse Case Manager Clinical Goal(s):  Marland Kitchen Over the next 30 days, patient will attend all scheduled medical appointments:  . Over the next 30 days, patient will work with CM team pharmacist to  review medications.  Interventions:  . Inter-disciplinary care team collaboration (see longitudinal plan of care) . Evaluation of current treatment plan and patient's adherence to plan as established by provider. . Reviewed medications with patient. [name] with pharmacist regarding medications. . Discussed plans with patient for ongoing care management follow up and provided patient with direct contact information for care management team . Reviewed scheduled/upcoming provider appointments.  . Pharmacy referral for medication review.  Patient Goals/Self-Care Activities Over the next 30 days, patient will:  -Attends all scheduled provider appointments Calls pharmacy for medication refills Calls provider office for new concerns or questions

## 2020-05-13 ENCOUNTER — Other Ambulatory Visit: Payer: Self-pay | Admitting: Obstetrics and Gynecology

## 2020-05-13 ENCOUNTER — Other Ambulatory Visit (HOSPITAL_COMMUNITY): Payer: Self-pay | Admitting: Cardiology

## 2020-05-13 NOTE — Patient Outreach (Signed)
Care Coordination  05/13/2020  Michele Owens 02-05-1961 [id number]   RNCM received phone call from patient regarding [employer].  Patient states she received a call and they will see her Monday, 05/17/20 at 0900 at her home.  Patient doesn't remember name of agency, she will let us know.  [name] RN, BSN [place]  [employer]ator - Managed Medicaid High Risk [phone].

## 2020-05-17 ENCOUNTER — Other Ambulatory Visit: Payer: Medicaid Other | Admitting: Obstetrics and Gynecology

## 2020-05-17 ENCOUNTER — Other Ambulatory Visit: Payer: Self-pay

## 2020-05-17 IMAGING — DX DG CHEST 2V
2 series · 2 of 2 positions shown · non-contrast
Comparison: PA and lateral chest 08/04/2017, 04/17/2017 and
01/23/2016.

CLINICAL DATA: Shortness of breath and bilateral lower extremity
swelling.

EXAM:
CHEST - 2 VIEW

[chest pa]
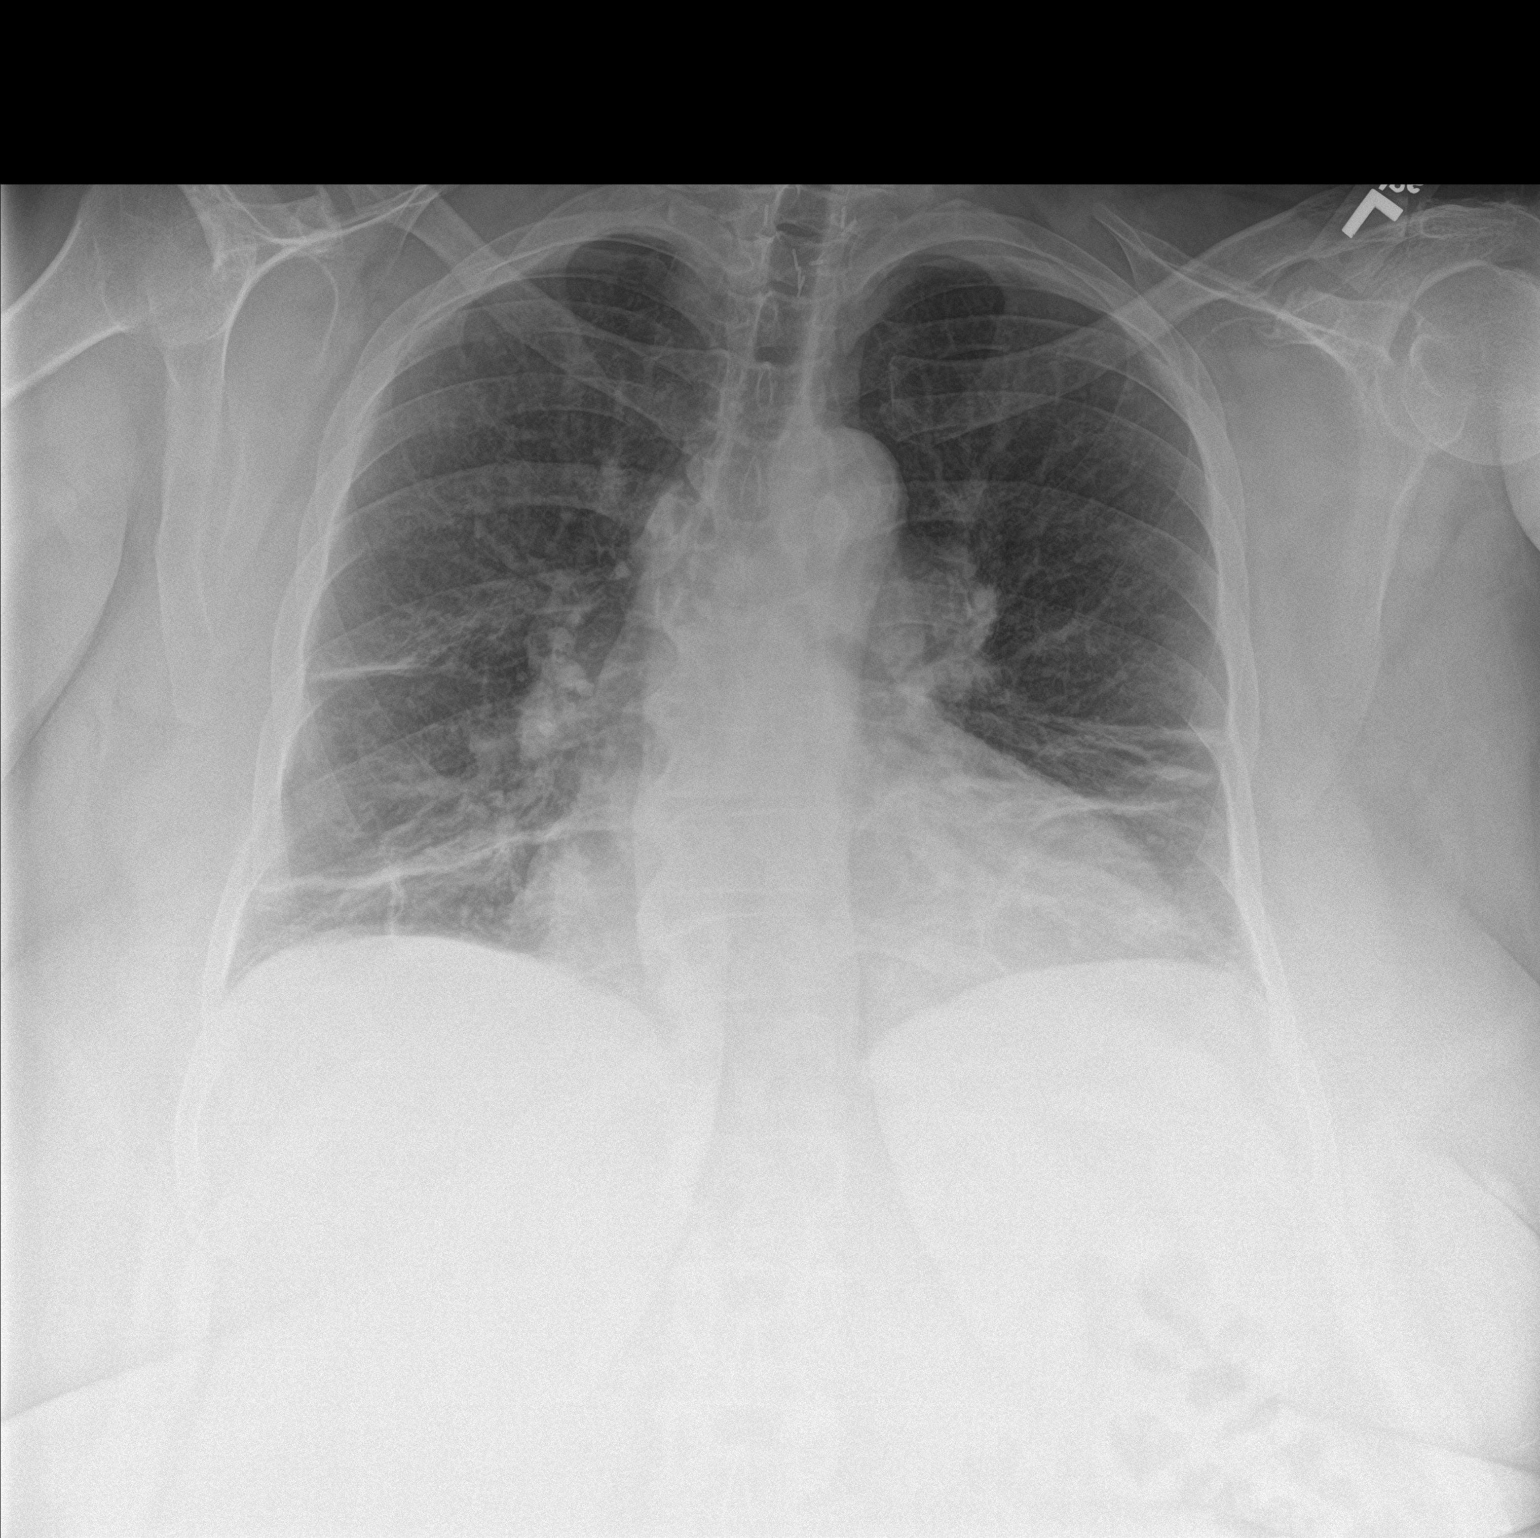

[chest lat]
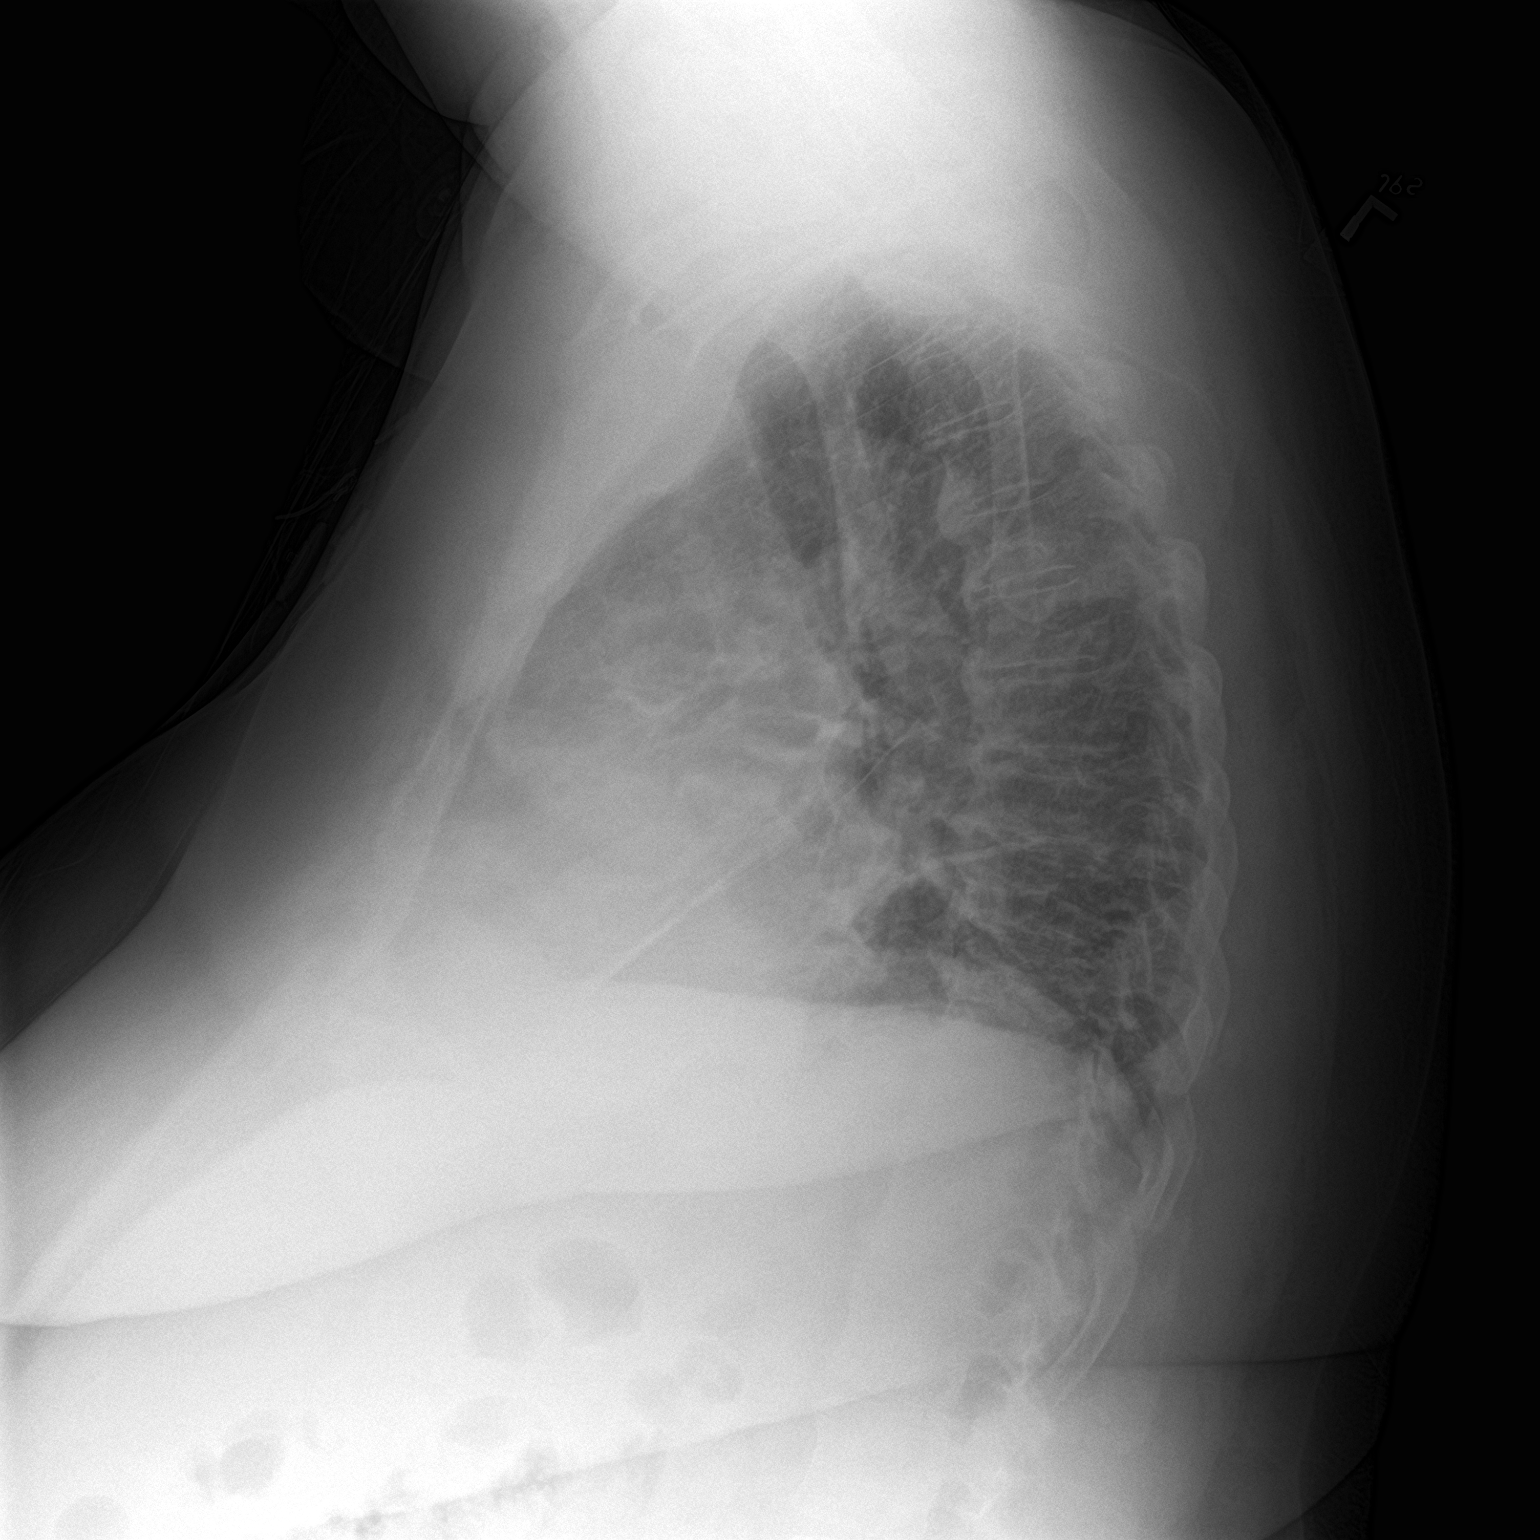

[2 of 2 positions shown; findings below may reference images not displayed]

FINDINGS: There is cardiomegaly without edema. Bilateral linear opacities
consistent with atelectasis or scar are identified. No acute bony
abnormality.
IMPRESSION: Cardiomegaly without acute disease.

## 2020-05-17 NOTE — Patient Outreach (Signed)
Medicaid Managed Care   Nurse Care Manager Note  05/17/2020 Name:  Michele Owens MRN:  [id number] DOB:  Nov 08, 1960  Michele Owens is an 60 y.o. year old female who is a primary patient of [name], [name], MD.  The [facility] Managed Care Coordination team was consulted for assistance with:    chronic healthcare management needs.  Michele Owens was given information about Medicaid Managed Care Coordination team services today. Michele Owens agreed to services and verbal consent obtained.  Engaged with patient by telephone for follow up visit in response to provider referral for case management and/or care coordination services.   Assessments/Interventions:  Review of past medical history, allergies, medications, health status, including review of consultants reports, laboratory and other test data, was performed as part of comprehensive evaluation and provision of chronic care management services.  SDOH (Social Determinants of Health) assessments and interventions performed:   Care Plan  Allergies  Allergen Reactions  . Bee Venom Swelling and Other (See Comments)    "All over my body" (swelling)  . Fioricet [Butalbital-Apap-Caffeine] Nausea And Vomiting and Rash  . Ibuprofen Rash and Other (See Comments)    Severe rash  . Lamisil [Terbinafine] Rash and Other (See Comments)    Pt states this causes her to "feel funny"  . Nsaids Other (See Comments)    Per MD's orders     Medications Reviewed Today    Reviewed by [name], RN (Registered Nurse) on 05/17/20 at 1600  Med List Status: <None>  Medication Order Taking? Sig Documenting Provider Last Dose Status Informant  albuterol (PROVENTIL HFA;VENTOLIN HFA) 108 (90 Base) MCG/ACT inhaler [id number] No Inhale 1-2 puffs into the lungs every 6 (six) hours as needed for wheezing or shortness of breath. Long, [name], MD unk Active Multiple Informants  allopurinol (ZYLOPRIM) 100 MG tablet [id number] No Take 100 mg by mouth daily.  [provider] 04/17/2020 Active Multiple Informants           Med Note [name], [name]   Tue Oct 23, 2017  2:58 AM)    [name] ELLIPTA 62.5-25 MCG/INH AEPB [id number] No Inhale 1 puff into the lungs daily.  [provider] 04/18/2020 Unknown time Active Multiple Informants  benzonatate (TESSALON) 200 MG capsule [id number] No Take 1 capsule (200 mg total) by mouth in the morning, at Owens, and at bedtime. 7 DS Niel Hummer A, MD 04/16/2020 Active Multiple Informants  Blood Glucose Monitoring Suppl (ACCU-CHEK AVIVA PLUS) w/Device KIT [id number] No 1 each by Other route daily.  [provider] unk unk Active Multiple Informants  calcitRIOL (ROCALTROL) 0.5 MCG capsule [id number] No Take 1 capsule (0.5 mcg total) by mouth daily. Modena Jansky, MD 04/17/2020 Active Multiple Informants           Med Note [name], [name]   Tue Oct 23, 2017  2:59 AM)    carvedilol (COREG) 6.25 MG tablet [id number] No Take 1 tablet (6.25 mg total) by mouth 2 (two) times daily with a meal. Bensimhon, [name], MD 04/17/2020 1700 Active Multiple Informants  clonazePAM (KLONOPIN) 2 MG tablet [id number] No Take 1 tablet (2 mg total) by mouth 3 (three) times daily. for anxiety [name], MD 04/18/2020 Unknown time Active Multiple Informants  cyclobenzaprine (FLEXERIL) 10 MG tablet [id number] No Take 10 mg by mouth 3 (three) times daily. [provider] 04/17/2020 Active Multiple Informants  diclofenac sodium (VOLTAREN) 1 % GEL [id number] No Apply 2 g topically 4 (four) times daily as needed (for  pain). [name], MD Past Week Unknown time Active Multiple Informants  EPINEPHrine 0.3 mg/0.3 mL IJ SOAJ injection [id number] No Inject 0.3 mg into the muscle daily as needed (allergic reaction). [provider] unk Active Multiple Informants  FEROSUL 325 (65 Fe) MG tablet [id number] No Take 325 mg by mouth daily. [provider] 04/17/2020 Active Multiple Informants           Med Note  [name], [name]   Tue Oct 23, 2017  2:57 AM)    glucose blood test strip [id number] No Use as instructed to check blood sugar up to 4 times daily  Patient taking differently: 1 each by Other route See admin instructions. Use as instructed to check blood sugar up to 4 times daily   [name], DO unk unk Active Multiple Informants  guaiFENesin (MUCINEX) 600 MG 12 hr tablet [id number] No Take 2 tablets (1,200 mg total) by mouth 2 (two) times daily. Regalado, Belkys A, MD unk Active Multiple Informants  ipratropium-albuterol (DUONEB) 0.5-2.5 (3) MG/3ML SOLN [id number] No Take 3 mLs by nebulization 4 (four) times daily.  [provider] unk Active Multiple Informants           Med Note [name], [name]   Tue Oct 23, 2017  2:57 AM)    levothyroxine (SYNTHROID, LEVOTHROID) 112 MCG tablet [id number] No Take 224 mcg by mouth daily. [provider] 04/18/2020 Unknown time Active Multiple Informants           Med Note [name], [name]   Tue Oct 23, 2017  2:57 AM)    lisinopril (PRINIVIL,ZESTRIL) 5 MG tablet [id number] No Take 1 tablet (5 mg total) by mouth daily. Please call for office visit [phone] [name], MD 04/18/2020 Unknown time Active Multiple Informants           Med Note [name], [name]   Tue Oct 23, 2017  2:58 AM)    loperamide (IMODIUM) 2 MG capsule [id number] No Take 1 capsule (2 mg total) by mouth 4 (four) times daily as needed for diarrhea or loose stools. [name], MD unk Active Multiple Informants  metFORMIN (GLUCOPHAGE) 500 MG tablet [id number] No Take 1 tablet (500 mg total) by mouth 2 (two) times daily with a meal. [name], MD 04/17/2020 Active Multiple Informants           Med Note [name], [name]   Tue Oct 23, 2017  2:58 AM)    nitroGLYCERIN (NITROSTAT) 0.4 MG SL tablet [id number] No Place 1 tablet (0.4 mg total) under the tongue every 5 (five) minutes as needed for chest pain. [name], MD unk Active Multiple Informants  nystatin  cream (MYCOSTATIN) [id number] No Apply 1 application topically 2 (two) times daily as needed for dry skin. Regalado, Belkys A, MD 04/16/2020 Active Multiple Informants  omeprazole (PRILOSEC) 20 MG capsule [id number] No Take 20 mg by mouth 2 (two) times daily before a meal. [provider] 04/17/2020 Active Multiple Informants  oxyCODONE-acetaminophen (PERCOCET) 10-325 MG tablet [id number] No Take 1 tablet by mouth every 6 (six) hours as needed for pain. [provider] 04/17/2020 Active Multiple Informants           Med Note Marcellina Millin Apr 12, 2020  1:22 PM) LF on 03-25-20 # 120 DS 30  OXYGEN [id number] No Inhale 2-3 L into the lungs continuous. [provider] 01/14/2020 Unknown time Active Multiple Informants  Med Note Corky Mull   Mon Apr 12, 2020 10:08 AM)    potassium chloride SA (K-DUR,KLOR-CON) 20 MEQ tablet [id number] No Take 1 tablet (20 mEq total) by mouth 2 (two) times daily. [name], MD 04/17/2020 Active Multiple Informants  QC STOOL SOFTENER PLS LAXATIVE 8.6-50 MG tablet [id number] No Take 1 tablet by mouth at bedtime. [provider] 04/17/2020 Active Multiple Informants           Med Note [name], [name]   Tue Oct 23, 2017  2:56 AM)    rosuvastatin (CRESTOR) 10 MG tablet [id number] No Take 10 mg by mouth every evening. [provider] 04/16/2020 Active Multiple Informants  torsemide (DEMADEX) 20 MG tablet [id number]  TAKE 4 (FOUR) TABLETS BY MOUTH DAILY (2AM+ 2NOON) [name], MD  Active   traZODone (DESYREL) 50 MG tablet [id number] No Take 100 mg by mouth at bedtime. [provider] 04/17/2020 Active Multiple Informants          Patient Active Problem List   Diagnosis Date Noted  . Acute on chronic respiratory failure with hypoxia and hypercapnia ([place]) 04/18/2020  . Acute on chronic respiratory failure with hypoxia ([place]) 04/18/2020  . Hemoptysis 04/18/2020  . Acute metabolic encephalopathy  29/19/1660  . Acute encephalopathy 04/18/2020  . Chronic diastolic CHF (congestive heart failure) ([place]) 04/18/2020  . Sepsis ([place]) 04/18/2020  . Morbid obesity with BMI of 50.0-59.9, adult ([place])   . Fall at home, initial encounter 06/23/2018  . HNP (herniated nucleus pulposus), thoracic 06/23/2018  . CHF (congestive heart failure) ([place]) 06/18/2018  . Hypertensive disorder 08/28/2017  . Obesity 08/28/2017  . COPD exacerbation ([place]) 08/04/2017  . Acute and chronic respiratory failure with hypercapnia ([place])   . Pulmonary HTN ([place])   . Genetic testing 02/27/2017  . Family history of thyroid cancer   . Hypomagnesemia 10/03/2016  . Atypical chest pain   . HLD (hyperlipidemia) 09/23/2016  . Gout 09/23/2016  . DVT (deep vein thrombosis) in pregnancy 09/23/2016  . Thyroid cancer ([place]) 09/23/2016  . Hypocalcemia 09/23/2016  . AKI (acute kidney injury) ([place]) 09/23/2016  . Acute pulmonary edema (HCC)   . Acute hypoxemic respiratory failure ([place])   . Ventilator dependent ([place])   . Chronic respiratory failure with hypoxia and hypercapnia ([place]) 07/16/2016  . CAP (community acquired pneumonia) 01/23/2016  . 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 Pulmonary hypertension ([place]) 01/23/2016  . Obesity hypoventilation syndrome ([place]) 08/26/2015  . Dyslipidemia associated with type 2 diabetes mellitus ([place]) 04/24/2015  . Leukocytosis 04/24/2015  . Controlled type 2 diabetes mellitus without complication, without long-term current use of insulin ([place]) 04/24/2015  . Anxiety 04/24/2015  . Benign essential HTN 04/24/2015  . Normocytic anemia 04/24/2015  . OSA (obstructive sleep apnea) 05/10/2012  . Tobacco abuse 07/04/2011    Conditions to be addressed/monitored per PCP order:  chronic healthcare management needs., OSA, DM2,HTN  Care Plan : General Plan of Care (Adult)  Updates made by [name], RN since 05/17/2020 12:00 AM    Problem: Barriers to Treatment      Long-Range Goal: Barriers to Treatment Identified and Managed   Start Date: 05/03/2020  Expected End Date: 08/01/2020  Recent Progress: Not on track  Priority: High  Note:   Michele Owens (see longitudinal plan of care for additional care plan information)  Current Barriers:  . Transportation barriers. . Limited access to caregiver-patient needs PCS, currently not receiving services she  is expecting.  Nurse Clinical Goal(s):  Marland Kitchen Over the next 30 days, patient will work with [employer] to address needs related to transportation. . Over the next day  patient will follow up with [place] as directed by [facility]. Marland Kitchen Update 05/12/20:  Patient has used [employer] and is very pleased. . Over the next 90 days, patient will attend all scheduled medical appointments. Marland Kitchen RNCM will make referral to SW for assistance with PCS.  Interventions: . Inter-disciplinary care team collaboration (see longitudinal plan of care) . Referred patient to community resources care guide team for assistance with transportation. . Provided patient with information about [employer]. . Discussed plans with patient for ongoing care management follow up and provided patient with direct contact information for care management team . Advised patient to call [place]. Marland Kitchen Collaborated with [facility] regarding transportation. . Assisted patient/caregiver with obtaining information about health plan benefits . Collaborated with Social Work regarding [employer].  Patient Self Care Activities:  . Patient will call [employer].    Follow Up:  Patient agrees to Care Plan and Follow-up.  Plan: The Managed Medicaid care management team will reach out to the patient again over the next 7 days. and The patient has been provided with contact information for the Managed Medicaid care management team and has been advised to call with any health  related questions or concerns.  Date/time of next scheduled RN care management/care coordination outreach:  05/24/20 at 1030.

## 2020-05-17 NOTE — Patient Instructions (Signed)
Visit Information  Michele Owens was given information about Medicaid Managed Care team care coordination services as a part of their [facility] Medicaid benefit. Michele Owens verbally consented to engagement with the [facility] Managed Care team.   For questions related to your [facility] health plan, please call: [phone]  If you would like to schedule transportation through your [facility] plan, please call the following number at least 2 days in advance of your appointment: [phone]   Patient verbalizes understanding of instructions provided today.   The Managed Medicaid care management team will reach out to the patient again over the next 7 days.  The patient has been provided with contact information for the Managed Medicaid care management team and has been advised to call with any health related questions or concerns.  [name] RN, BSN [place]  Triad Curator - Managed Medicaid High Risk [phone].  Following is a copy of your plan of care:  Patient Care Plan: General Plan of Care (Adult)    Problem Identified: Health Promotion or Disease Self-Management (General Plan of Care)   Priority: Medium  Onset Date: 04/23/2020    Problem Identified: Barriers to Treatment     Long-Range Goal: Barriers to Treatment Identified and Managed   Start Date: 05/03/2020  Expected End Date: 08/01/2020  Recent Progress: Not on track  Priority: High  Note:   CARE PLAN ENTRY Medicaid Managed Care (see longitudinal plan of care for additional care plan information)  Current Barriers:  . Transportation barriers. . Limited access to caregiver-patient needs PCS, currently not receiving services she is expecting.  Nurse Clinical Goal(s):  Marland Kitchen Over the next 30 days, patient will work with [employer] to address needs related to transportation. . Over the next day  patient will follow up with [place] as  directed by [facility]. Marland Kitchen Update 05/12/20:  Patient has used [employer] and is very pleased. . Over the next 90 days, patient will attend all scheduled medical appointments. Marland Kitchen RNCM will make referral to SW for assistance with PCS.  Interventions: . Inter-disciplinary care team collaboration (see longitudinal plan of care) . Referred patient to community resources care guide team for assistance with transportation. . Provided patient with information about [employer]. . Discussed plans with patient for ongoing care management follow up and provided patient with direct contact information for care management team . Advised patient to call [place]. Marland Kitchen Collaborated with [facility] regarding transportation. . Assisted patient/caregiver with obtaining information about health plan benefits . Collaborated with Social Work regarding [employer].  Patient Self Care Activities:  . Patient will call [employer].

## 2020-05-18 ENCOUNTER — Other Ambulatory Visit: Payer: Self-pay

## 2020-05-18 NOTE — Patient Outreach (Signed)
Care Coordination  05/18/2020  Londynn Sonoda 1960/10/03 [id number]   Medicaid Managed Care   Unsuccessful Outreach Note  05/18/2020 Name: [name] MRN: [id number] DOB: 1961/03/03  Referred by: Everrett Coombe, MD Reason for referral : High Risk Managed Medicaid (HR MM Unsuccessful Telephone Outreach)   An unsuccessful telephone outreach was attempted today. The patient was referred to the case management team for assistance with care management and care coordination.   Follow Up Plan: The care management team will reach out to the patient again over the next 7 days.   Mickel Fuchs, BSW, [place]  High Risk Managed Medicaid Team

## 2020-05-18 NOTE — Patient Instructions (Signed)
Visit Information  Ms. Michele Owens  - as a part of your Medicaid benefit, you are eligible for care management and care coordination services at no cost or copay. I was unable to reach you by phone today but would be happy to help you with your health related needs. Please feel free to call me @ [phone].   A member of the Managed Medicaid care management team will reach out to you again over the next 7 days.   Mickel Fuchs, BSW, [place]  High Risk Managed Medicaid Team

## 2020-05-19 ENCOUNTER — Ambulatory Visit: Payer: Medicaid Other | Admitting: Physical Therapy

## 2020-05-19 ENCOUNTER — Other Ambulatory Visit: Payer: Self-pay | Admitting: Obstetrics and Gynecology

## 2020-05-19 NOTE — Patient Outreach (Signed)
Care Coordination  05/19/2020  [name] December 23, 1960 [id number]    Medicaid Managed Care   Unsuccessful Outreach Note  05/19/2020 Name: Michele Owens MRN: [id number] DOB: 1960-09-19  Referred by: Everrett Coombe, MD Reason for referral : High Risk Managed Medicaid (Unsuccessful telephone outreach)   An unsuccessful telephone outreach was attempted today. The patient was referred to the case management team for assistance with care management and care coordination.   Follow Up Plan: A member of the Managed Medicaid care management team will reach out to the patient again over the next 7 days.   [name] RN, BSN [place]  Triad Curator - Managed Medicaid High Risk [phone].

## 2020-05-19 NOTE — Patient Instructions (Signed)
Hi Ms. Benn, sorry I missed you today. - as a part of your Medicaid benefit, you are eligible for care management and care coordination services at no cost or copay. I was unable to reach you by phone today but would be happy to help you with your health related needs. Please feel free to call me at [phone].  A member of the Managed Medicaid care management team will reach out to you again over the next 7 days.   [name] RN, BSN [place]  Triad Curator - Managed Medicaid High Risk [phone].

## 2020-05-20 ENCOUNTER — Other Ambulatory Visit: Payer: Self-pay

## 2020-05-20 ENCOUNTER — Other Ambulatory Visit: Payer: Medicaid Other | Admitting: Obstetrics and Gynecology

## 2020-05-20 NOTE — Patient Instructions (Addendum)
Visit Information  Michele Owens was given information about Medicaid Managed Care team care coordination services as a part of their [facility] Medicaid benefit. Michele Owens verbally consented to engagement with the [facility] Managed Care team.   For questions related to your [facility] health plan, please call: [phone]  If you would like to schedule transportation through your [facility] plan, please call the following number at least 2 days in advance of your appointment: [phone]  Patient verbalizes understanding of instructions provided today.   The Managed Medicaid care management team will reach out to the patient again over the next 7 days.  The patient has been provided with contact information for the Managed Medicaid care management team and has been advised to call with any health related questions or concerns.   [name] RN, BSN [place]  Triad Curator - Managed Medicaid High Risk [phone].   Following is a copy of your plan of care:    .CARE PLAN ENTRY Medicaid Managed Care (see longitudinal plan of care for additional care plan information)  Current Barriers:  . Transportation barriers. . Limited access to caregiver-patient needs PCS, currently not receiving services she is expecting.  Nurse Clinical Goal(s):  Marland Kitchen Over the next 30 days, patient will work with [employer] to address needs related to transportation. . Over the next day  patient will follow up with [place] as directed by [facility]. Marland Kitchen Update 05/12/20:  Patient has used [employer] and is very pleased. . Over the next 90 days, patient will attend all scheduled medical appointments. Marland Kitchen RNCM will make referral to SW for assistance with PCS . Update 05/20/20:  Patient states she is now receiving PCS from [facility] 3 days a week for 4 hours each day.  Interventions: . Inter-disciplinary care team  collaboration (see longitudinal plan of care) . Referred patient to community resources care guide team for assistance with transportation. . Provided patient with information about [employer]. . Discussed plans with patient for ongoing care management follow up and provided patient with direct contact information for care management team . Advised patient to call [place]. Marland Kitchen Collaborated with [facility] regarding transportation. . Assisted patient/caregiver with obtaining information about health plan benefits . Collaborated with Social Work regarding [employer].  Patient Self Care Activities:  Patient will call [place].    Current Barriers:  . Chronic Disease Management support and education needs.  Nurse Case Manager Clinical Goal(s):  Marland Kitchen Over the next 30 days, patient will attend all scheduled medical appointments:  . Over the next 30 days, patient will work with CM team pharmacist to review medications. Marland Kitchen Update 05/20/20:  Patient has appointment with Pharmacist 05/21/20.  Interventions:  . Inter-disciplinary care team collaboration (see longitudinal plan of care) . Evaluation of current treatment plan and patient's adherence to plan as established by provider. . Reviewed medications with patient. [name] with pharmacist regarding medications. . Discussed plans with patient for ongoing care management follow up and provided patient with direct contact information for care management team . Reviewed scheduled/upcoming provider appointments.  Marland Kitchen Update 05/20/20:  Patient unable to attend PT appointment 05/19/20, has next appointment 05/26/20.  Has Cardiology appointment 05/28/20.  Reviewed [place] information with patient again and with aide. Marland Kitchen Pharmacy referral for medication review.  Patient Goals/Self-Care Activities Over the next 30 days, patient will:  -Attends all scheduled provider appointments Calls pharmacy for medication  refills Calls provider office for new concerns or  questions  Follow Up Plan: The Managed Medicaid care management team will reach out to the patient again over the next 30 days.  The patient has been provided with contact information for the Managed Medicaid care management team and has been advised to call with any health related questions or concerns.

## 2020-05-20 NOTE — Patient Outreach (Signed)
Medicaid Managed Care   Nurse Care Manager Note  05/20/2020 Name:  Michele Owens MRN:  [id number] DOB:  [birth year]  Michele Owens is an 60 y.o. year old female who is a primary patient of [name], Michele Mulling, MD.  The [facility] Managed Care Coordination team was consulted for assistance with:    chronic healthcare management needs.  [name] was given information about Medicaid Managed Care Coordination team services today. Michele Owens agreed to services and verbal consent obtained.  Engaged with patient by telephone for follow up visit in response to provider referral for case management and/or care coordination services.   Assessments/Interventions:  Review of past medical history, allergies, medications, health status, including review of consultants reports, laboratory and other test data, was performed as part of comprehensive evaluation and provision of chronic care management services.  SDOH (Social Determinants of Health) assessments and interventions performed:   Care Plan  Allergies  Allergen Reactions  . Bee Venom Swelling and Other (See Comments)    "All over my body" (swelling)  . Fioricet [Butalbital-Apap-Caffeine] Nausea And Vomiting and Rash  . Ibuprofen Rash and Other (See Comments)    Severe rash  . Lamisil [Terbinafine] Rash and Other (See Comments)    Pt states this causes her to "feel funny"  . Nsaids Other (See Comments)    Per MD's orders     Medications Reviewed Today    Reviewed by [name], RN (Registered Nurse) on 05/20/20 at 1435  Med List Status: <None>  Medication Order Taking? Sig Documenting Provider Last Dose Status Informant  albuterol (PROVENTIL HFA;VENTOLIN HFA) 108 (90 Base) MCG/ACT inhaler [id number] No Inhale 1-2 puffs into the lungs every 6 (six) hours as needed for wheezing or shortness of breath. Long, [name], MD unk Active Multiple Informants  allopurinol (ZYLOPRIM) 100 MG tablet [id number] No Take 100 mg by mouth daily.  [provider] 04/17/2020 Active Multiple Informants           Med Note [name], [name]   Tue Oct 23, 2017  2:58 AM)    [name] ELLIPTA 62.5-25 MCG/INH AEPB [id number] No Inhale 1 puff into the lungs daily.  [provider] 04/18/2020 Unknown time Active Multiple Informants  benzonatate (TESSALON) 200 MG capsule [id number] No Take 1 capsule (200 mg total) by mouth in the morning, at Owens, and at bedtime. 7 DS Niel Hummer A, MD 04/16/2020 Active Multiple Informants  Blood Glucose Monitoring Suppl (ACCU-CHEK AVIVA PLUS) w/Device KIT [id number] No 1 each by Other route daily.  [provider] unk unk Active Multiple Informants  calcitRIOL (ROCALTROL) 0.5 MCG capsule [id number] No Take 1 capsule (0.5 mcg total) by mouth daily. Modena Jansky, MD 04/17/2020 Active Multiple Informants           Med Note [name], [name]   Tue Oct 23, 2017  2:59 AM)    carvedilol (COREG) 6.25 MG tablet [id number] No Take 1 tablet (6.25 mg total) by mouth 2 (two) times daily with a meal. Bensimhon, [name], MD 04/17/2020 1700 Active Multiple Informants  clonazePAM (KLONOPIN) 2 MG tablet [id number] No Take 1 tablet (2 mg total) by mouth 3 (three) times daily. for anxiety [name], MD 04/18/2020 Unknown time Active Multiple Informants  cyclobenzaprine (FLEXERIL) 10 MG tablet [id number] No Take 10 mg by mouth 3 (three) times daily. [provider] 04/17/2020 Active Multiple Informants  diclofenac sodium (VOLTAREN) 1 % GEL [id number] No Apply 2 g topically 4 (four) times daily as needed (for  pain). [name], MD Past Week Unknown time Active Multiple Informants  EPINEPHrine 0.3 mg/0.3 mL IJ SOAJ injection [id number] No Inject 0.3 mg into the muscle daily as needed (allergic reaction). [provider] unk Active Multiple Informants  FEROSUL 325 (65 Fe) MG tablet [id number] No Take 325 mg by mouth daily. [provider] 04/17/2020 Active Multiple Informants           Med Note  [name], [name]   Tue Oct 23, 2017  2:57 AM)    glucose blood test strip [id number] No Use as instructed to check blood sugar up to 4 times daily  Patient taking differently: 1 each by Other route See admin instructions. Use as instructed to check blood sugar up to 4 times daily   [name], DO unk unk Active Multiple Informants  guaiFENesin (MUCINEX) 600 MG 12 hr tablet [id number] No Take 2 tablets (1,200 mg total) by mouth 2 (two) times daily. Regalado, Belkys A, MD unk Active Multiple Informants  ipratropium-albuterol (DUONEB) 0.5-2.5 (3) MG/3ML SOLN [id number] No Take 3 mLs by nebulization 4 (four) times daily.  [provider] unk Active Multiple Informants           Med Note [name], [name]   Tue Oct 23, 2017  2:57 AM)    levothyroxine (SYNTHROID, LEVOTHROID) 112 MCG tablet [id number] No Take 224 mcg by mouth daily. [provider] 04/18/2020 Unknown time Active Multiple Informants           Med Note [name], [name]   Tue Oct 23, 2017  2:57 AM)    lisinopril (PRINIVIL,ZESTRIL) 5 MG tablet [id number] No Take 1 tablet (5 mg total) by mouth daily. Please call for office visit [phone] [name], MD 04/18/2020 Unknown time Active Multiple Informants           Med Note [name], [name]   Tue Oct 23, 2017  2:58 AM)    loperamide (IMODIUM) 2 MG capsule [id number] No Take 1 capsule (2 mg total) by mouth 4 (four) times daily as needed for diarrhea or loose stools. [name], MD unk Active Multiple Informants  metFORMIN (GLUCOPHAGE) 500 MG tablet [id number] No Take 1 tablet (500 mg total) by mouth 2 (two) times daily with a meal. [name], MD 04/17/2020 Active Multiple Informants           Med Note [name], [name]   Tue Oct 23, 2017  2:58 AM)    nitroGLYCERIN (NITROSTAT) 0.4 MG SL tablet [id number] No Place 1 tablet (0.4 mg total) under the tongue every 5 (five) minutes as needed for chest pain. [name], MD unk Active Multiple Informants  nystatin  cream (MYCOSTATIN) [id number] No Apply 1 application topically 2 (two) times daily as needed for dry skin. Regalado, Belkys A, MD 04/16/2020 Active Multiple Informants  omeprazole (PRILOSEC) 20 MG capsule [id number] No Take 20 mg by mouth 2 (two) times daily before a meal. [provider] 04/17/2020 Active Multiple Informants  oxyCODONE-acetaminophen (PERCOCET) 10-325 MG tablet [id number] No Take 1 tablet by mouth every 6 (six) hours as needed for pain. [provider] 04/17/2020 Active Multiple Informants           Med Note Marcellina Millin Apr 12, 2020  1:22 PM) LF on 03-25-20 # 120 DS 30  OXYGEN [id number] No Inhale 2-3 L into the lungs continuous. [provider] 01/14/2020 Unknown time Active Multiple Informants  Med Note Corky Mull   Mon Apr 12, 2020 10:08 AM)    potassium chloride SA (K-DUR,KLOR-CON) 20 MEQ tablet [id number] No Take 1 tablet (20 mEq total) by mouth 2 (two) times daily. [name], MD 04/17/2020 Active Multiple Informants  QC STOOL SOFTENER PLS LAXATIVE 8.6-50 MG tablet [id number] No Take 1 tablet by mouth at bedtime. [provider] 04/17/2020 Active Multiple Informants           Med Note [name], [name]   Tue Oct 23, 2017  2:56 AM)    rosuvastatin (CRESTOR) 10 MG tablet [id number] No Take 10 mg by mouth every evening. [provider] 04/16/2020 Active Multiple Informants  torsemide (DEMADEX) 20 MG tablet [id number]  TAKE 4 (FOUR) TABLETS BY MOUTH DAILY (2AM+ 2NOON) [name], MD  Active   traZODone (DESYREL) 50 MG tablet [id number] No Take 100 mg by mouth at bedtime. [provider] 04/17/2020 Active Multiple Informants          Patient Active Problem List   Diagnosis Date Noted  . Acute on chronic respiratory failure with hypoxia and hypercapnia ([place]) 04/18/2020  . Acute on chronic respiratory failure with hypoxia ([place]) 04/18/2020  . Hemoptysis 04/18/2020  . Acute metabolic encephalopathy  97/98/9211  . Acute encephalopathy 04/18/2020  . Chronic diastolic CHF (congestive heart failure) ([place]) 04/18/2020  . Sepsis ([place]) 04/18/2020  . Morbid obesity with BMI of 50.0-59.9, adult ([place])   . Fall at home, initial encounter 06/23/2018  . HNP (herniated nucleus pulposus), thoracic 06/23/2018  . CHF (congestive heart failure) ([place]) 06/18/2018  . Hypertensive disorder 08/28/2017  . Obesity 08/28/2017  . COPD exacerbation ([place]) 08/04/2017  . Acute and chronic respiratory failure with hypercapnia ([place])   . Pulmonary HTN ([place])   . Genetic testing 02/27/2017  . Family history of thyroid cancer   . Hypomagnesemia 10/03/2016  . Atypical chest pain   . HLD (hyperlipidemia) 09/23/2016  . Gout 09/23/2016  . DVT (deep vein thrombosis) in pregnancy 09/23/2016  . Thyroid cancer ([place]) 09/23/2016  . Hypocalcemia 09/23/2016  . AKI (acute kidney injury) ([place]) 09/23/2016  . Acute pulmonary edema ([facility])   . Acute hypoxemic respiratory failure ([place])   . Ventilator dependent ([place])   . Chronic respiratory failure with hypoxia and hypercapnia ([place]) 07/16/2016  . CAP (community acquired pneumonia) 01/23/2016  . 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 Pulmonary hypertension ([place]) 01/23/2016  . Obesity hypoventilation syndrome ([place]) 08/26/2015  . Dyslipidemia associated with type 2 diabetes mellitus ([place]) 04/24/2015  . Leukocytosis 04/24/2015  . Controlled type 2 diabetes mellitus without complication, without long-term current use of insulin ([place]) 04/24/2015  . Anxiety 04/24/2015  . Benign essential HTN 04/24/2015  . Normocytic anemia 04/24/2015  . OSA (obstructive sleep apnea) 05/10/2012  . Tobacco abuse 07/04/2011    Conditions to be addressed/monitored per PCP order:  chronic healthcare management needs, OSA, DM2, obesity  Care Plan : General Plan of Care (Adult)  Updates made by [name], RN since 05/20/2020 12:00 AM    Problem: Health Promotion or Disease  Self-Management (General Plan of Care)   Priority: High  Onset Date: 04/23/2020    Long-Range Goal: Self-Management Plan Developed   Start Date: 04/23/2020  Expected End Date: 07/22/2020  Recent Progress: Not on track  Priority: Medium  Note:   Current Barriers:  . Chronic Disease Management support and education needs.  Nurse Case Manager Clinical Goal(s):  Marland Kitchen Over the next 30 days,  patient will attend all scheduled medical appointments:  . Over the next 30 days, patient will work with CM team pharmacist to review medications. Marland Kitchen Update 05/20/20:  Patient has appointment with Pharmacist 05/21/20.  Interventions:  . Inter-disciplinary care team collaboration (see longitudinal plan of care) . Evaluation of current treatment plan and patient's adherence to plan as established by provider. . Reviewed medications with patient. [name] with pharmacist regarding medications. . Discussed plans with patient for ongoing care management follow up and provided patient with direct contact information for care management team . Reviewed scheduled/upcoming provider appointments.  Marland Kitchen Update 05/20/20:  Patient unable to attend PT appointment 05/19/20, has next appointment 05/26/20.  Has Cardiology appointment 05/28/20.  Reviewed [place] information with patient again and with aide. Marland Kitchen Pharmacy referral for medication review.  Patient Goals/Self-Care Activities Over the next 30 days, patient will:  -Attends all scheduled provider appointments Calls pharmacy for medication refills Calls provider office for new concerns or questions  Follow Up Plan: The Managed Medicaid care management team will reach out to the patient again over the next 30 days.  The patient has been provided with contact information for the Managed Medicaid care management team and has been advised to call with any health related questions or concerns.      Follow Up:  Patient agrees to Care Plan and  Follow-up.  Plan: The Managed Medicaid care management team will reach out to the patient again over the next 7 days. and The patient has been provided with contact information for the Managed Medicaid care management team and has been advised to call with any health related questions or concerns.  Date/time of next scheduled RN care management/care coordination outreach:  05/24/20 at 1030.

## 2020-05-21 ENCOUNTER — Other Ambulatory Visit: Payer: Self-pay

## 2020-05-24 ENCOUNTER — Encounter (HOSPITAL_COMMUNITY): Payer: Self-pay

## 2020-05-24 ENCOUNTER — Ambulatory Visit (HOSPITAL_COMMUNITY)
Admission: EM | Admit: 2020-05-24 | Discharge: 2020-05-24 | Disposition: A | Discharge status: Home / Self Care | Payer: Medicaid Other

## 2020-05-24 ENCOUNTER — Emergency Department (HOSPITAL_COMMUNITY)
Admission: EM | Admit: 2020-05-24 | Discharge: 2020-05-25 | Disposition: A | Discharge status: Home / Self Care | Payer: Medicaid Other | Attending: Emergency Medicine | Admitting: Emergency Medicine

## 2020-05-24 ENCOUNTER — Other Ambulatory Visit: Payer: Self-pay | Admitting: Obstetrics and Gynecology

## 2020-05-24 ENCOUNTER — Encounter (HOSPITAL_COMMUNITY): Payer: Self-pay | Admitting: *Deleted

## 2020-05-24 ENCOUNTER — Other Ambulatory Visit: Payer: Self-pay

## 2020-05-24 DIAGNOSIS — E119 Type 2 diabetes mellitus without complications: Secondary | ICD-10-CM | POA: Insufficient documentation

## 2020-05-24 DIAGNOSIS — Z7984 Long term (current) use of oral hypoglycemic drugs: Secondary | ICD-10-CM | POA: Diagnosis not present

## 2020-05-24 DIAGNOSIS — I110 Hypertensive heart disease with heart failure: Secondary | ICD-10-CM | POA: Diagnosis not present

## 2020-05-24 DIAGNOSIS — J45909 Unspecified asthma, uncomplicated: Secondary | ICD-10-CM | POA: Insufficient documentation

## 2020-05-24 DIAGNOSIS — L03311 Cellulitis of abdominal wall: Secondary | ICD-10-CM | POA: Diagnosis not present

## 2020-05-24 DIAGNOSIS — I5032 Chronic diastolic (congestive) heart failure: Secondary | ICD-10-CM | POA: Diagnosis not present

## 2020-05-24 DIAGNOSIS — I10 Essential (primary) hypertension: Secondary | ICD-10-CM | POA: Diagnosis not present

## 2020-05-24 DIAGNOSIS — Z85850 Personal history of malignant neoplasm of thyroid: Secondary | ICD-10-CM | POA: Insufficient documentation

## 2020-05-24 DIAGNOSIS — J441 Chronic obstructive pulmonary disease with (acute) exacerbation: Secondary | ICD-10-CM | POA: Diagnosis not present

## 2020-05-24 DIAGNOSIS — Z79899 Other long term (current) drug therapy: Secondary | ICD-10-CM | POA: Insufficient documentation

## 2020-05-24 DIAGNOSIS — L02211 Cutaneous abscess of abdominal wall: Secondary | ICD-10-CM | POA: Diagnosis not present

## 2020-05-24 DIAGNOSIS — Z7951 Long term (current) use of inhaled steroids: Secondary | ICD-10-CM | POA: Diagnosis not present

## 2020-05-24 DIAGNOSIS — Z87891 Personal history of nicotine dependence: Secondary | ICD-10-CM | POA: Insufficient documentation

## 2020-05-24 DIAGNOSIS — R000 Tachycardia, unspecified: Secondary | ICD-10-CM

## 2020-05-24 LAB — COMPREHENSIVE METABOLIC PANEL
ALT: 22 U/L (ref 0–44)
AST: 21 U/L (ref 15–41)
Albumin: 3.9 g/dL (ref 3.5–5.0)
Alkaline Phosphatase: 75 U/L (ref 38–126)
Anion gap: 14 (ref 5–15)
BUN: 8 mg/dL (ref 6–20)
CO2: 32 mmol/L (ref 22–32)
Calcium: 8.5 mg/dL — ABNORMAL LOW (ref 8.9–10.3)
Chloride: 93 mmol/L — ABNORMAL LOW (ref 98–111)
Creatinine, Ser: 0.65 mg/dL (ref 0.44–1.00)
GFR, Estimated: >60 mL/min (ref >60)
Glucose, Bld: 108 mg/dL — ABNORMAL HIGH (ref 70–99)
Potassium: 3.6 mmol/L (ref 3.5–5.1)
Sodium: 139 mmol/L (ref 135–145)
Total Bilirubin: 0.8 mg/dL (ref 0.3–1.2)
Total Protein: 7.9 g/dL (ref 6.5–8.1)

## 2020-05-24 LAB — CBC WITH DIFFERENTIAL/PLATELET
Abs Immature Granulocytes: 0.17 10*3/uL — ABNORMAL HIGH (ref 0.00–0.07)
Basophils Absolute: 0.1 10*3/uL (ref 0.0–0.1)
Basophils Relative: 0 %
Eosinophils Absolute: 0.6 10*3/uL — ABNORMAL HIGH (ref 0.0–0.5)
Eosinophils Relative: 3 %
HCT: 38.5 % (ref 36.0–46.0)
Hemoglobin: 12.3 g/dL (ref 12.0–15.0)
Immature Granulocytes: 1 %
Lymphocytes Relative: 16 %
Lymphs Abs: 2.7 10*3/uL (ref 0.7–4.0)
MCH: 29.7 pg (ref 26.0–34.0)
MCHC: 31.9 g/dL (ref 30.0–36.0)
MCV: 93 fL (ref 80.0–100.0)
Monocytes Absolute: 1 10*3/uL (ref 0.1–1.0)
Monocytes Relative: 6 %
Neutro Abs: 12.5 10*3/uL — ABNORMAL HIGH (ref 1.7–7.7)
Neutrophils Relative %: 74 %
Platelets: 369 10*3/uL (ref 150–400)
RBC: 4.14 MIL/uL (ref 3.87–5.11)
RDW: 13.3 % (ref 11.5–15.5)
WBC: 17 10*3/uL — ABNORMAL HIGH (ref 4.0–10.5)
nRBC: 0 % (ref 0.0–0.2)

## 2020-05-24 LAB — LACTIC ACID, PLASMA: Lactic Acid, Venous: 1.8 mmol/L (ref 0.5–1.9)

## 2020-05-24 MED ORDER — LIDOCAINE-EPINEPHRINE 1 %-1:100000 IJ SOLN
10.0000 mL | Freq: Once | INTRAMUSCULAR | Status: AC
  Administered 2020-05-24: 10 mL
  Filled 2020-05-24: qty 1

## 2020-05-24 MED ORDER — CLINDAMYCIN PHOSPHATE 600 MG/50ML IV SOLN
600.0000 mg | Freq: Once | INTRAVENOUS | Status: AC
  Administered 2020-05-24: 600 mg via INTRAVENOUS
  Filled 2020-05-24: qty 50

## 2020-05-24 MED ORDER — CLINDAMYCIN HCL 150 MG PO CAPS: 300.0000 mg | ORAL_CAPSULE | Freq: Three times a day (TID) | ORAL | 0 refills | Status: AC

## 2020-05-24 MED ORDER — MORPHINE SULFATE (PF) 4 MG/ML IV SOLN
4.0000 mg | Freq: Once | INTRAVENOUS | Status: AC
  Administered 2020-05-24: 4 mg via INTRAVENOUS
  Filled 2020-05-24: qty 1

## 2020-05-24 MED ORDER — OXYCODONE-ACETAMINOPHEN 5-325 MG PO TABS
1.0000 | ORAL_TABLET | ORAL | Status: AC | PRN
  Administered 2020-05-24 – 2020-05-25 (×2): 1 via ORAL
  Filled 2020-05-24 (×2): qty 1

## 2020-05-24 NOTE — Discharge Instructions (Addendum)
Continue taking home medications as prescribed. Take clindamycin as prescribed.  Take entire course, even if symptoms improve. Continue taking your home pain medication as prescribed. Use warm compresses on the area 3 times a day to help with inflammation and pain. Follow-up with your primary care doctor in 2 days for wound recheck. Return to the emergency room if you develop persistent high fevers, severe worsening pain, increased pus draining from the area, increased weakness/confusion, or any new or worsening, or concerning symptoms

## 2020-05-24 NOTE — ED Notes (Signed)
Pt assisted to bedside commode, tolerated well. Pt given drink and graham crackers per request.

## 2020-05-24 NOTE — ED Triage Notes (Signed)
Pt presents with abscess at the bottom of abdomen X 2 weeks.

## 2020-05-24 NOTE — ED Notes (Signed)
Discharge paperwork had be provided to pt by [facility] RN before being moved to yellow zone. This RN pulled up d/c paperwork and explained details again to pt. Pt verbalized. Understanding .

## 2020-05-24 NOTE — Patient Outreach (Signed)
Medicaid Managed Care   Nurse Care Manager Note  05/24/2020 Name:  Michele Owens MRN:  [id number] DOB:  February 17, 1961  Michele Owens is an 60 y.o. year old female who is a primary patient of [name], [name], MD.  The [facility] Managed Care Coordination team was consulted for assistance with:    chronic healthcare management needs.  Michele Owens was given information about Medicaid Managed Care Coordination team services today. Michele Owens agreed to services and verbal consent obtained.  Engaged with patient by telephone for follow up visit in response to provider referral for case management and/or care coordination services.   Assessments/Interventions:  Review of past medical history, allergies, medications, health status, including review of consultants reports, laboratory and other test data, was performed as part of comprehensive evaluation and provision of chronic care management services.  SDOH (Social Determinants of Health) assessments and interventions performed:   Care Plan  Allergies  Allergen Reactions  . Bee Venom Swelling and Other (See Comments)    "All over my body" (swelling)  . Fioricet [Butalbital-Apap-Caffeine] Nausea And Vomiting and Rash  . Ibuprofen Rash and Other (See Comments)    Severe rash  . Lamisil [Terbinafine] Rash and Other (See Comments)    Pt states this causes her to "feel funny"  . Nsaids Other (See Comments)    Per MD's orders     Medications Reviewed Today    Reviewed by [name], RN (Registered Nurse) on 05/24/20 at [place] List Status: <None>  Medication Order Taking? Sig Documenting Provider Last Dose Status Informant  albuterol (PROVENTIL HFA;VENTOLIN HFA) 108 (90 Base) MCG/ACT inhaler [id number] No Inhale 1-2 puffs into the lungs every 6 (six) hours as needed for wheezing or shortness of breath. Long, [name], MD unk Active Multiple Informants  allopurinol (ZYLOPRIM) 100 MG tablet [id number] No Take 100 mg by mouth daily.  [provider] 04/17/2020 Active Multiple Informants           Med Note Michele Owens   Tue Oct 23, 2017  2:58 AM)    Michele Owens ELLIPTA 62.5-25 MCG/INH AEPB [id number] No Inhale 1 puff into the lungs daily.  [provider] 04/18/2020 Unknown time Active Multiple Informants  benzonatate (TESSALON) 200 MG capsule [id number] No Take 1 capsule (200 mg total) by mouth in the morning, at Owens, and at bedtime. 7 DS Niel Hummer A, MD 04/16/2020 Active Multiple Informants  Blood Glucose Monitoring Suppl (ACCU-CHEK AVIVA PLUS) Owens/Device KIT [id number] No 1 each by Other route daily.  [provider] unk unk Active Multiple Informants  calcitRIOL (ROCALTROL) 0.5 MCG capsule [id number] No Take 1 capsule (0.5 mcg total) by mouth daily. Modena Jansky, MD 04/17/2020 Active Multiple Informants           Med Note Michele Owens   Tue Oct 23, 2017  2:59 AM)    carvedilol (COREG) 6.25 MG tablet [id number] No Take 1 tablet (6.25 mg total) by mouth 2 (two) times daily with a meal. Bensimhon, [name], MD 04/17/2020 1700 Active Multiple Informants  clonazePAM (KLONOPIN) 2 MG tablet [id number] No Take 1 tablet (2 mg total) by mouth 3 (three) times daily. for anxiety [name], MD 04/18/2020 Unknown time Active Multiple Informants  cyclobenzaprine (FLEXERIL) 10 MG tablet [id number] No Take 10 mg by mouth 3 (three) times daily. [provider] 04/17/2020 Active Multiple Informants  diclofenac sodium (VOLTAREN) 1 % GEL [id number] No Apply 2 g topically 4 (four) times daily as needed (for  pain). [name], MD Past Week Unknown time Active Multiple Informants  EPINEPHrine 0.3 mg/0.3 mL IJ SOAJ injection [id number] No Inject 0.3 mg into the muscle daily as needed (allergic reaction). [provider] unk Active Multiple Informants  FEROSUL 325 (65 Fe) MG tablet [id number] No Take 325 mg by mouth daily. [provider] 04/17/2020 Active Multiple Informants           Med Note  Michele Owens   Tue Oct 23, 2017  2:57 AM)    glucose blood test strip [id number] No Use as instructed to check blood sugar up to 4 times daily  Patient taking differently: 1 each by Other route See admin instructions. Use as instructed to check blood sugar up to 4 times daily   Michele Phi, DO unk unk Active Multiple Informants  guaiFENesin (MUCINEX) 600 MG 12 hr tablet [id number] No Take 2 tablets (1,200 mg total) by mouth 2 (two) times daily. Owens, Michele A, MD unk Active Multiple Informants  ipratropium-albuterol (DUONEB) 0.5-2.5 (3) MG/3ML SOLN [id number] No Take 3 mLs by nebulization 4 (four) times daily.  [provider] unk Active Multiple Informants           Med Note Michele Owens   Tue Oct 23, 2017  2:57 AM)    levothyroxine (SYNTHROID, LEVOTHROID) 112 MCG tablet [id number] No Take 224 mcg by mouth daily. [provider] 04/18/2020 Unknown time Active Multiple Informants           Med Note Michele Owens   Tue Oct 23, 2017  2:57 AM)    lisinopril (PRINIVIL,ZESTRIL) 5 MG tablet [id number] No Take 1 tablet (5 mg total) by mouth daily. Please call for office visit [phone] Michele Dresser, MD 04/18/2020 Unknown time Active Multiple Informants           Med Note Michele Owens   Tue Oct 23, 2017  2:58 AM)    loperamide (IMODIUM) 2 MG capsule [id number] No Take 1 capsule (2 mg total) by mouth 4 (four) times daily as needed for diarrhea or loose stools. [name], MD unk Active Multiple Informants  metFORMIN (GLUCOPHAGE) 500 MG tablet [id number] No Take 1 tablet (500 mg total) by mouth 2 (two) times daily with a meal. Michele Redwood, MD 04/17/2020 Active Multiple Informants           Med Note Michele Owens   Tue Oct 23, 2017  2:58 AM)    nitroGLYCERIN (NITROSTAT) 0.4 MG SL tablet [id number] No Place 1 tablet (0.4 mg total) under the tongue every 5 (five) minutes as needed for chest pain. [name], MD unk Active Multiple Informants  nystatin  cream (MYCOSTATIN) [id number] No Apply 1 application topically 2 (two) times daily as needed for dry skin. Owens, Michele A, MD 04/16/2020 Active Multiple Informants  omeprazole (PRILOSEC) 20 MG capsule [id number] No Take 20 mg by mouth 2 (two) times daily before a meal. [provider] 04/17/2020 Active Multiple Informants  oxyCODONE-acetaminophen (PERCOCET) 10-325 MG tablet [id number] No Take 1 tablet by mouth every 6 (six) hours as needed for pain. [provider] 04/17/2020 Active Multiple Informants           Med Note Marcellina Millin Apr 12, 2020  1:22 PM) LF on 03-25-20 # 120 DS 30  OXYGEN [id number] No Inhale 2-3 L into the lungs continuous. [provider] 01/14/2020 Unknown time Active Multiple Informants  Med Note Corky Mull   Mon Apr 12, 2020 10:08 AM)    potassium chloride SA (K-DUR,KLOR-CON) 20 MEQ tablet [id number] No Take 1 tablet (20 mEq total) by mouth 2 (two) times daily. Michele Dresser, MD 04/17/2020 Active Multiple Informants  QC STOOL SOFTENER PLS LAXATIVE 8.6-50 MG tablet [id number] No Take 1 tablet by mouth at bedtime. [provider] 04/17/2020 Active Multiple Informants           Med Note Michele Owens   Tue Oct 23, 2017  2:56 AM)    rosuvastatin (CRESTOR) 10 MG tablet [id number] No Take 10 mg by mouth every evening. [provider] 04/16/2020 Active Multiple Informants  torsemide (DEMADEX) 20 MG tablet [id number]  TAKE 4 (FOUR) TABLETS BY MOUTH DAILY (2AM+ 2NOON) Michele Dresser, MD  Active   traZODone (DESYREL) 50 MG tablet [id number] No Take 100 mg by mouth at bedtime. [provider] 04/17/2020 Active Multiple Informants          Patient Active Problem List   Diagnosis Date Noted  . Acute on chronic respiratory failure with hypoxia and hypercapnia ([place]) 04/18/2020  . Acute on chronic respiratory failure with hypoxia ([place]) 04/18/2020  . Hemoptysis 04/18/2020  . Acute metabolic encephalopathy  43/15/4008  . Acute encephalopathy 04/18/2020  . Chronic diastolic CHF (congestive heart failure) ([place]) 04/18/2020  . Sepsis ([place]) 04/18/2020  . Morbid obesity with BMI of 50.0-59.9, adult ([place])   . Fall at home, initial encounter 06/23/2018  . HNP (herniated nucleus pulposus), thoracic 06/23/2018  . CHF (congestive heart failure) ([place]) 06/18/2018  . Hypertensive disorder 08/28/2017  . Obesity 08/28/2017  . COPD exacerbation ([place]) 08/04/2017  . Acute and chronic respiratory failure with hypercapnia ([place])   . Pulmonary HTN ([place])   . Genetic testing 02/27/2017  . Family history of thyroid cancer   . Hypomagnesemia 10/03/2016  . Atypical chest pain   . HLD (hyperlipidemia) 09/23/2016  . Gout 09/23/2016  . DVT (deep vein thrombosis) in pregnancy 09/23/2016  . Thyroid cancer ([place]) 09/23/2016  . Hypocalcemia 09/23/2016  . AKI (acute kidney injury) ([place]) 09/23/2016  . Acute pulmonary edema (H[facility])   . Acute hypoxemic respiratory failure ([place])   . Ventilator dependent ([place])   . Chronic respiratory failure with hypoxia and hypercapnia ([place]) 07/16/2016  . CAP (community acquired pneumonia) 01/23/2016  . Multinodular goiter Owens/ dominant right thyroid nodule 01/23/2016  . Pulmonary hypertension ([place]) 01/23/2016  . Obesity hypoventilation syndrome ([place]) 08/26/2015  . Dyslipidemia associated with type 2 diabetes mellitus ([place]) 04/24/2015  . Leukocytosis 04/24/2015  . Controlled type 2 diabetes mellitus without complication, without long-term current use of insulin ([place]) 04/24/2015  . Anxiety 04/24/2015  . Benign essential HTN 04/24/2015  . Normocytic anemia 04/24/2015  . OSA (obstructive sleep apnea) 05/10/2012  . Tobacco abuse 07/04/2011    Conditions to be addressed/monitored per PCP order:  chronic healthcare management needs-OSA, DM, HTN, CHF  Care Plan : General Plan of Care (Adult)  Updates made by [name], RN since 05/24/2020 12:00 AM    Problem: Health Promotion or Disease  Self-Management (General Plan of Care)   Priority: High  Onset Date: 04/23/2020    Long-Range Goal: Self-Management Plan Developed   Start Date: 04/23/2020  Expected End Date: 07/22/2020  Recent Progress: Not on track  Priority: Medium  Note:   Current Barriers:  . Chronic Disease Management support and education needs.  Nurse Case Manager Clinical Goal(s):  Marland Kitchen Over the next 30 days,  patient will attend all scheduled medical appointments:  . Over the next 30 days, patient will work with CM team pharmacist to review medications. Marland Kitchen Update 05/20/20:  Patient has appointment with Pharmacist 05/21/20.  Interventions:  . Inter-disciplinary care team collaboration (see longitudinal plan of care) . Evaluation of current treatment plan and patient's adherence to plan as established by provider. . Reviewed medications with patient. [name] with pharmacist regarding medications. . Discussed plans with patient for ongoing care management follow up and provided patient with direct contact information for care management team . Reviewed scheduled/upcoming provider appointments.  Marland Kitchen Update 05/20/20:  Patient unable to attend PT appointment 05/19/20, has next appointment 05/26/20.  Has Cardiology appointment 05/28/20.  Reviewed [place] information with patient again and with aide. Marland Kitchen Pharmacy referral for medication review. Marland Kitchen Update 05/24/20: Pharmacist has attempted to speak with patient and has been unable to.  Patient at Urgent Care today with stomach pain.  RNCM will reschedule appointment with patient and speak with her regarding importance of speaking with Pharmacist.  Patient Goals/Self-Care Activities Over the next 30 days, patient will:  -Attends all scheduled provider appointments Calls pharmacy for medication refills Calls provider office for new concerns or questions  Follow Up Plan: The Managed Medicaid care management team will reach out to the patient again over the next 30  days.  The patient has been provided with contact information for the Managed Medicaid care management team and has been advised to call with any health related questions or concerns.      Follow Up:  Patient agrees to Care Plan and Follow-up.  Plan: The Managed Medicaid care management team will reach out to the patient again over the next 30 days. and The patient has been provided with contact information for the Managed Medicaid care management team and has been advised to call with any health related questions or concerns.  Date/time of next scheduled RN care management/care coordination outreach:  05/31/20 at 0900.

## 2020-05-24 NOTE — ED Notes (Signed)
Patient is being discharged from the Urgent Care and sent to the Emergency Department via POV with Caregiver . Per Lina Sar, PA, patient is in need of higher level of care due to possible infection due to abscess. Patient is aware and verbalizes understanding of plan of care.  Vitals:   05/24/20 1203  BP: (!) 123/55  Pulse: (!) 110  Temp: 99.3 F (37.4 C)  SpO2: 97%

## 2020-05-24 NOTE — Patient Instructions (Signed)
Hi Ms. Gitto, thank you for speaking with me today.  Ms. Flock was given information about Medicaid Managed Care team care coordination services as a part of their [facility] Medicaid benefit. [name] verbally consented to engagement with the [facility] Managed Care team.   For questions related to your [facility] health plan, please call: [phone]  If you would like to schedule transportation through your [facility] plan, please call the following number at least 2 days in advance of your appointment: [phone]  Ms. Dewalt - following are the goals we discussed in your visit today:  Goals Addressed            This Visit's Progress   . Protect My Health       Timeframe:  Long-Range Goal Priority:  Medium Start Date:    04/23/20                         Expected End Date:       07/22/20                Follow Up Date 05/24/20   - schedule appointment for vaccines needed due to my age or health - schedule recommended health tests (blood work, mammogram, colonoscopy, pap test) - schedule and keep appointment for annual check-up  Patient has PT appointment 05/19/20. Update 05/20/20:  Patient unable to attend PT appointment on 05/19/20.  Has next PT appointment 05/26/20.  Cardiology appointment 05/28/20. Update 05/24/20:  Attempted to reach patient today-she is at Urgent Care with stomach pain.  Will reschedule.         Patient verbalizes understanding of instructions provided today.   The Managed Medicaid care management team will reach out to the patient again over the next 7 days.  The patient has been provided with contact information for the Managed Medicaid care management team and has been advised to call with any health related questions or concerns.   [name] RN, BSN [place] Management Coordinator - Managed Medicaid High Risk [phone]  Following is a copy of your plan of care:  Patient Care Plan: General Plan of  Care (Adult)    Problem Identified: Health Promotion or Disease Self-Management (General Plan of Care)   Priority: Medium  Onset Date: 04/23/2020    Problem Identified: Barriers to Treatment     Long-Range Goal: Barriers to Treatment Identified and Managed   Start Date: 05/03/2020  Expected End Date: 08/01/2020  Recent Progress: Not on track  Priority: High  Note:   CARE PLAN ENTRY Medicaid Managed Care (see longitudinal plan of care for additional care plan information)  Current Barriers:  . Transportation barriers. . Limited access to caregiver-patient needs PCS, currently not receiving services she is expecting.  Nurse Clinical Goal(s):  Marland Kitchen Over the next 30 days, patient will work with [employer] to address needs related to transportation. . Over the next day  patient will follow up with [facility] as directed by [facility]. Marland Kitchen Update 05/12/20:  Patient has used [employer] and is very pleased. . Over the next 90 days, patient will attend all scheduled medical appointments. Marland Kitchen RNCM will make referral to SW for assistance with PCS . Update 05/20/20:  Patient states she is now receiving PCS from [facility] 3 days a week for 4 hours each day.  Interventions: . Inter-disciplinary care team collaboration (see longitudinal plan of care) . Referred patient to community resources care  guide team for assistance with transportation. . Provided patient with information about [employer]. . Discussed plans with patient for ongoing care management follow up and provided patient with direct contact information for care management team . Advised patient to call [place]. Marland Kitchen Collaborated with [facility] regarding transportation. . Assisted patient/caregiver with obtaining information about health plan benefits . Collaborated with Social Work regarding [employer].  Patient Self Care Activities:  . Patient will call [employer].           Patient Care Plan: General Plan of Care (Adult)    Problem Identified: Health Promotion or Disease Self-Management (General Plan of Care)   Priority: High  Onset Date: 04/23/2020    Long-Range Goal: Self-Management Plan Developed   Start Date: 04/23/2020  Expected End Date: 07/22/2020  Recent Progress: Not on track  Priority: Medium  Note:   Current Barriers:  . Chronic Disease Management support and education needs.  Nurse Case Manager Clinical Goal(s):  Marland Kitchen Over the next 30 days, patient will attend all scheduled medical appointments:  . Over the next 30 days, patient will work with CM team pharmacist to review medications. Marland Kitchen Update 05/20/20:  Patient has appointment with Pharmacist 05/21/20.  Interventions:  . Inter-disciplinary care team collaboration (see longitudinal plan of care) . Evaluation of current treatment plan and patient's adherence to plan as established by provider. . Reviewed medications with patient. [name] with pharmacist regarding medications. . Discussed plans with patient for ongoing care management follow up and provided patient with direct contact information for care management team . Reviewed scheduled/upcoming provider appointments.  Marland Kitchen Update 05/20/20:  Patient unable to attend PT appointment 05/19/20, has next appointment 05/26/20.  Has Cardiology appointment 05/28/20.  Reviewed [place] information with patient again and with aide. Marland Kitchen Pharmacy referral for medication review. Marland Kitchen Update 05/24/20: Pharmacist has attempted to speak with patient and has been unable to.  Patient at Urgent Care today with stomach pain.  RNCM will reschedule appointment with patient and speak with her regarding importance of speaking with Pharmacist.  Patient Goals/Self-Care Activities Over the next 30 days, patient will:  -Attends all scheduled provider appointments Calls pharmacy for medication refills Calls provider office for new  concerns or questions  Follow Up Plan: The Managed Medicaid care management team will reach out to the patient again over the next 30 days.  The patient has been provided with contact information for the Managed Medicaid care management team and has been advised to call with any health related questions or concerns.

## 2020-05-24 NOTE — ED Triage Notes (Signed)
Pt reports going to ucc and sent here for iv antibiotics. Has abscess to lower abd that is red, swollen and reports fever. Had n/v/d this am.

## 2020-05-24 NOTE — ED Notes (Signed)
While waiting for PTAR . Pt provided sandwich bag with additional cheese, crackers and soda. Pt given warm blankets and adjusted in bed. Pt verbalized comfort at this time.

## 2020-05-24 NOTE — Discharge Instructions (Signed)
Please report to the emergency room as I am concerned you have a more severe infection of cellulitis and abscess. You will likely be considered for more lab tests, imaging and IV antibiotics. Please have your aide drive you to the emergency room now.

## 2020-05-24 NOTE — ED Provider Notes (Signed)
[facility] EMERGENCY DEPARTMENT Provider Note   CSN: [id number] Arrival date & time: 05/24/20  1248     History Chief Complaint  Patient presents with  . Abscess    Michele Owens is a 60 y.o. female presenting for evaluation of left lower abdomen infection.  Patient states 2 weeks ago she developed multiple boils including over her left lower quadrant, suprapubic, right groin, and right buttock cheek.  All of them healed except the one in her left lower quadrant.  Over the past week, she reports worsening pain, purulent drainage, redness, tenderness, warmth.  She reports a fever yesterday of 99.7.  She has been taking oxycodone without significant improvement of symptoms.  She has been using an over-the-counter antibiotic ointment without improvement.  She was seen in urgent care, sent to the ER for further evaluation.  She does have a history of diabetes, states they have been well controlled for her.  She denies confusion, nausea, vomiting.  She is not currently on any p.o. antibiotics, finished a course of abx for pneumonia in January.  Additional history taken chart reviewed.  Patient with a history of anxiety, COPD on O2 at baseline, CHF, diabetes, GERD, hypertension   HPI     Past Medical History:  Diagnosis Date  . Anxiety   . Arthritis   . Asthma   . Chronic diastolic CHF (congestive heart failure) ([place])   . COPD (chronic obstructive pulmonary disease) (HCC)    Uses Oxygen at night  . Depression   . Diabetes mellitus without complication ([place])   . Dyspnea    occ  . Family history of thyroid cancer   . GERD (gastroesophageal reflux disease)   . Gout   . Headache    migraines  . History of DVT of lower extremity   . History of nuclear stress test    Myoview 2/17:  Low risk stress nuclear study with a small, moderate intensity, partially reversible inferior lateral defect consistent with small prior infarct and minimal peri-infarct ischemia; EF 68  with normal wall motion  . Hypertension   . Pneumonia   . Thyroid cancer ([place]) 09/23/2016    Patient Active Problem List   Diagnosis Date Noted  . Acute on chronic respiratory failure with hypoxia and hypercapnia ([place]) 04/18/2020  . Acute on chronic respiratory failure with hypoxia ([place]) 04/18/2020  . Hemoptysis 04/18/2020  . Acute metabolic encephalopathy 96/22/2979  . Acute encephalopathy 04/18/2020  . Chronic diastolic CHF (congestive heart failure) ([place]) 04/18/2020  . Sepsis ([place]) 04/18/2020  . Morbid obesity with BMI of 50.0-59.9, adult ([place])   . Fall at home, initial encounter 06/23/2018  . HNP (herniated nucleus pulposus), thoracic 06/23/2018  . CHF (congestive heart failure) ([place]) 06/18/2018  . Hypertensive disorder 08/28/2017  . Obesity 08/28/2017  . COPD exacerbation ([place]) 08/04/2017  . Acute and chronic respiratory failure with hypercapnia ([place])   . Pulmonary HTN ([place])   . Genetic testing 02/27/2017  . Family history of thyroid cancer   . Hypomagnesemia 10/03/2016  . Atypical chest pain   . HLD (hyperlipidemia) 09/23/2016  . Gout 09/23/2016  . DVT (deep vein thrombosis) in pregnancy 09/23/2016  . Thyroid cancer ([place]) 09/23/2016  . Hypocalcemia 09/23/2016  . AKI (acute kidney injury) ([place]) 09/23/2016  . Acute pulmonary edema (HCC)   . Acute hypoxemic respiratory failure ([place])   . Ventilator dependent ([place])   . Chronic respiratory failure with hypoxia and hypercapnia ([place]) 07/16/2016  . CAP (community acquired pneumonia) 01/23/2016  .  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 Pulmonary hypertension ([place]) 01/23/2016  . Obesity hypoventilation syndrome ([place]) 08/26/2015  . Dyslipidemia associated with type 2 diabetes mellitus ([place]) 04/24/2015  . Leukocytosis 04/24/2015  . Controlled type 2 diabetes mellitus without complication, without long-term current use of insulin ([place]) 04/24/2015  . Anxiety 04/24/2015  . Benign essential HTN 04/24/2015   . Normocytic anemia 04/24/2015  . OSA (obstructive sleep apnea) 05/10/2012  . Tobacco abuse 07/04/2011    Past Surgical History:  Procedure Laterality Date  . BUNIONECTOMY Bilateral   . CARDIAC CATHETERIZATION N/A 06/23/2015   Procedure: Right/Left Heart Cath and Coronary Angiography;  Surgeon: [name], MD;  Location: [facility];  Service: Cardiovascular;  Laterality: N/A;  . COLONOSCOPY WITH PROPOFOL N/A 12/15/2015   Procedure: COLONOSCOPY WITH PROPOFOL;  Surgeon: [name], MD;  Location: [facility];  Service: Endoscopy;  Laterality: N/A;  . RIGHT HEART CATH N/A 10/01/2019   Procedure: RIGHT HEART CATH;  Surgeon: [name], MD;  Location: [facility];  Service: Cardiovascular;  Laterality: N/A;  . THYROIDECTOMY  09/19/2016  . THYROIDECTOMY N/A 09/19/2016   Procedure: TOTAL THYROIDECTOMY;  Surgeon: [name], MD;  Location: [facility];  Service: General;  Laterality: N/A;  . TONSILLECTOMY    . TOTAL ABDOMINAL HYSTERECTOMY  07/14/10     OB History   No obstetric history on file.     Family History  Problem Relation Age of Onset  . Cancer Father        thought to be due to exposure to concrete  . Diabetes Mother   . Thyroid cancer Mother        dx in her 48s-60s  . Cancer Maternal Uncle        2 uncles with cancer NOS  . Brain cancer Paternal Aunt   . Cancer Cousin        maternal first cousin - NOS  . Cancer Cousin        maternal first cousin - NOS    Social History   Tobacco Use  . Smoking status: Former Smoker    Packs/day: 0.50    Years: 41.00    Pack years: 20.50    Types: Cigarettes    Quit date: 07/2016    Years since quitting: 3.8  . Smokeless tobacco: Never Used  . Tobacco comment: Starting to Wean Off -- using Nicotine Patch  Vaping Use  . Vaping Use: Never used  Substance Use Topics  . Alcohol use: Not Currently    Alcohol/week: 0.0 standard drinks    Comment: ? none now  . Drug use: Never    Comment: none for long time     Home Medications Prior to Admission medications   Medication Sig Start Date End Date Taking? Authorizing Provider  clindamycin (CLEOCIN) 150 MG capsule Take 2 capsules (300 mg total) by mouth 3 (three) times daily for 7 days. 05/24/20 05/31/20 Yes Harriet Bollen, PA-C  albuterol (PROVENTIL HFA;VENTOLIN HFA) 108 (90 Base) MCG/ACT inhaler Inhale 1-2 puffs into the lungs every 6 (six) hours as needed for wheezing or shortness of breath. 04/23/18   Long, [name], MD  allopurinol (ZYLOPRIM) 100 MG tablet Take 100 mg by mouth daily.    [provider]  ANORO ELLIPTA 62.5-25 MCG/INH AEPB Inhale 1 puff into the lungs daily.  04/11/18   [provider]  benzonatate (TESSALON) 200 MG capsule Take 1 capsule (200 mg total) by mouth in  the morning, at noon, and at bedtime. 7 DS 04/13/20   Regalado, Jerald Kief A, MD  Blood Glucose Monitoring Suppl (ACCU-CHEK AVIVA PLUS) w/Device KIT 1 each by Other route daily.  08/07/17   [provider]  calcitRIOL (ROCALTROL) 0.5 MCG capsule Take 1 capsule (0.5 mcg total) by mouth daily. 09/26/16   Hongalgi, [name], MD  carvedilol (COREG) 6.25 MG tablet Take 1 tablet (6.25 mg total) by mouth 2 (two) times daily with a meal. 01/23/18   Bensimhon, [name], MD  clonazePAM (KLONOPIN) 2 MG tablet Take 1 tablet (2 mg total) by mouth 3 (three) times daily. for anxiety 06/23/19   [name], MD  cyclobenzaprine (FLEXERIL) 10 MG tablet Take 10 mg by mouth 3 (three) times daily. 11/19/19   [provider]  diclofenac sodium (VOLTAREN) 1 % GEL Apply 2 g topically 4 (four) times daily as needed (for pain). 06/19/18   [name], MD  EPINEPHrine 0.3 mg/0.3 mL IJ SOAJ injection Inject 0.3 mg into the muscle daily as needed (allergic reaction).    [provider]  FEROSUL 325 (65 Fe) MG tablet Take 325 mg by mouth daily. 03/22/17   [provider]  glucose blood test strip Use as instructed to check blood sugar up to 4 times  daily Patient taking differently: 1 each by Other route See admin instructions. Use as instructed to check blood sugar up to 4 times daily 08/06/17   [name], DO  guaiFENesin (MUCINEX) 600 MG 12 hr tablet Take 2 tablets (1,200 mg total) by mouth 2 (two) times daily. 04/13/20   Regalado, Belkys A, MD  ipratropium-albuterol (DUONEB) 0.5-2.5 (3) MG/3ML SOLN Take 3 mLs by nebulization 4 (four) times daily.  04/05/17   [provider]  levothyroxine (SYNTHROID, LEVOTHROID) 112 MCG tablet Take 224 mcg by mouth daily. 07/11/17   [provider]  lisinopril (PRINIVIL,ZESTRIL) 5 MG tablet Take 1 tablet (5 mg total) by mouth daily. Please call for office visit [phone] 08/15/17   [name], MD  loperamide (IMODIUM) 2 MG capsule Take 1 capsule (2 mg total) by mouth 4 (four) times daily as needed for diarrhea or loose stools. 08/12/37   [name], MD  metFORMIN (GLUCOPHAGE) 500 MG tablet Take 1 tablet (500 mg total) by mouth 2 (two) times daily with a meal. 12/11/14   [name], MD  nitroGLYCERIN (NITROSTAT) 0.4 MG SL tablet Place 1 tablet (0.4 mg total) under the tongue every 5 (five) minutes as needed for chest pain. 06/19/18   [name], MD  nystatin cream (MYCOSTATIN) Apply 1 application topically 2 (two) times daily as needed for dry skin. 04/13/20   Regalado, Belkys A, MD  omeprazole (PRILOSEC) 20 MG capsule Take 20 mg by mouth 2 (two) times daily before a meal. 11/19/19   [provider]  oxyCODONE-acetaminophen (PERCOCET) 10-325 MG tablet Take 1 tablet by mouth every 6 (six) hours as needed for pain. 12/03/19   [provider]  OXYGEN Inhale 2-3 L into the lungs continuous.    [provider]  potassium chloride SA (K-DUR,KLOR-CON) 20 MEQ tablet Take 1 tablet (20 mEq total) by mouth 2 (two) times daily. 07/24/18   [name], MD  QC STOOL SOFTENER PLS LAXATIVE 8.6-50 MG tablet Take 1 tablet by mouth at bedtime. 04/11/17   [provider]  rosuvastatin (CRESTOR) 10 MG tablet Take 10 mg by mouth every evening. 11/19/19   [provider]  torsemide (  DEMADEX) 20 MG tablet TAKE 4 (FOUR) TABLETS BY MOUTH DAILY (2AM+ 2NOON) 05/14/20   [name], MD  traZODone (DESYREL) 50 MG tablet Take 100 mg by mouth at bedtime. 11/19/19   [provider]    Allergies    Bee venom, Fioricet [butalbital-apap-caffeine], Ibuprofen, Lamisil [terbinafine], and Nsaids  Review of Systems   Review of Systems  Constitutional: Positive for fever.  Gastrointestinal: Positive for abdominal pain.  All other systems reviewed and are negative.   Physical Exam Updated Vital Signs BP 134/76   Pulse 94   Temp 97.7 F (36.5 C) (Oral)   Resp 19   SpO2 98%   Physical Exam Vitals and nursing note reviewed.  Constitutional:      General: She is not in acute distress.    Appearance: She is well-developed and well-nourished. She is obese.     Comments: Resting in the bed in no acute distress  HENT:     Head: Normocephalic and atraumatic.  Eyes:     Extraocular Movements: Extraocular movements intact and EOM normal.     Conjunctiva/sclera: Conjunctivae normal.     Pupils: Pupils are equal, round, and reactive to light.  Cardiovascular:     Rate and Rhythm: Normal rate and regular rhythm.     Pulses: Normal pulses and intact distal pulses.  Pulmonary:     Effort: Pulmonary effort is normal. No respiratory distress.     Breath sounds: Normal breath sounds. No wheezing.  Abdominal:     General: There is no distension.     Palpations: Abdomen is soft. There is no mass.     Tenderness: There is abdominal tenderness. There is no guarding or rebound.     Comments: Erythema, induration, and warmth of the skin of the left lower quadrant.  Superficial area of fluctuance without drainage. Tenderness to palpation of the skin in this area. No further abscesses noted including in the suprapubic region, right groin, or right  buttock. No TTP elsewhere in the abdomen.  No rigidity, guarding, distention.  No rebound  Musculoskeletal:        General: Normal range of motion.     Cervical back: Normal range of motion and neck supple.  Skin:    General: Skin is warm and dry.     Capillary Refill: Capillary refill takes less than 2 seconds.  Neurological:     Mental Status: She is alert and oriented to person, place, and time.  Psychiatric:        Mood and Affect: Mood and affect normal.     ED Results / Procedures / Treatments   Labs (all labs ordered are listed, but only abnormal results are displayed) Labs Reviewed  COMPREHENSIVE METABOLIC PANEL - Abnormal; Notable for the following components:      Result Value   Chloride 93 (*)    Glucose, Bld 108 (*)    Calcium 8.5 (*)    All other components within normal limits  CBC WITH DIFFERENTIAL/PLATELET - Abnormal; Notable for the following components:   WBC 17.0 (*)    Neutro Abs 12.5 (*)    Eosinophils Absolute 0.6 (*)    Abs Immature Granulocytes 0.17 (*)    All other components within normal limits  LACTIC ACID, PLASMA  URINALYSIS, ROUTINE W REFLEX MICROSCOPIC    EKG None  Radiology No results found.  Procedures .Marland KitchenIncision and Drainage  Date/Time: 05/24/2020 4:50 PM Performed by: Franchot Heidelberg, PA-C Authorized by: Franchot Heidelberg, PA-C   Consent:  Consent obtained:  Verbal   Consent given by:  Patient   Risks, benefits, and alternatives were discussed: yes     Risks discussed:  Bleeding, incomplete drainage, infection, pain and damage to other organs Location:    Type:  Abscess   Location:  Trunk   Trunk location:  Abdomen Pre-procedure details:    Skin preparation:  Antiseptic wash Sedation:    Sedation type:  None Anesthesia:    Anesthesia method:  Local infiltration   Local anesthetic:  Lidocaine 2% WITH epi Procedure type:    Complexity:  Simple Procedure details:    Ultrasound guidance: no     Needle aspiration: no      Incision types:  Single straight   Incision depth:  Dermal   Wound management:  Probed and deloculated   Drainage:  Bloody and serous   Drainage amount:  Moderate   Wound treatment:  Wound left open   Packing materials:  None Post-procedure details:    Procedure completion:  Tolerated well, no immediate complications     Medications Ordered in ED Medications  oxyCODONE-acetaminophen (PERCOCET/ROXICET) 5-325 MG per tablet 1 tablet (1 tablet Oral Given 05/24/20 1332)  lidocaine-EPINEPHrine (XYLOCAINE W/EPI) 1 %-1:100000 (with pres) injection 10 mL (10 mLs Infiltration Given by Other 05/24/20 1622)  clindamycin (CLEOCIN) IVPB 600 mg (0 mg Intravenous Stopped 05/24/20 1626)  morphine 4 MG/ML injection 4 mg (4 mg Intravenous Given 05/24/20 1551)    ED Course  I have reviewed the triage vital signs and the nursing notes.  Pertinent labs & imaging results that were available during my care of the patient were reviewed by me and considered in my medical decision making (see chart for details).    MDM Rules/Calculators/A&P                          Patient presented for evaluation of abdominal wall infection.  On exam, patient appears nontoxic.  Vitals are reassuring.  However considering patient's history of diabetes, obtain labs to ensure no concerning findings.  Bedside ultrasound shows very superficial fluid collection, otherwise significant edema but no deep space infection.  Labs interpreted by me, shows leukocytosis of 17 consistent with infection, otherwise reassuring.  Lactic negative.  Exam not consistent with sepsis.  I&D performed as described above.  Serous fluid drained from the superficial collection.  Discussed with patient.  Discussed taking antibiotics as prescribed, close follow-up with PCP.  Considering patient's overall reassuring vital signs and benign abdominal exam, I do not believe she needs CT imaging or admission to the hospital at this time.  At this time, patient  appears safe for discharge.  Return precautions given.  Patient states she understands and agrees to plan.  Final Clinical Impression(s) / ED Diagnoses Final diagnoses:  Abdominal wall abscess  Abdominal wall cellulitis    Rx / DC Orders ED Discharge Orders         Ordered    clindamycin (CLEOCIN) 150 MG capsule  3 times daily        05/24/20 1620           Franchot Heidelberg, PA-C 05/24/20 1651    Gareth Morgan, MD 05/25/20 2253

## 2020-05-24 NOTE — ED Provider Notes (Signed)
[place]   MRN: [id number] DOB: 06-29-1960  Subjective:   Michele Owens is a 60 y.o. female presenting for 2 week history of acute onset worsening severe lower abdominal pain. Has had multiple boils, states that one of them popped yesterday and had a lot of drainage. Still has severe pain and feels like there are more forming. Has used an antibiotic ointment over the area. She also took oxycodone this morning with no relief in her pain. Patient was actually hospitalized in January 2022, leukocytosis, hypoxia, sepsis.   No current facility-administered medications for this encounter.  Current Outpatient Medications:  .  albuterol (PROVENTIL HFA;VENTOLIN HFA) 108 (90 Base) MCG/ACT inhaler, Inhale 1-2 puffs into the lungs every 6 (six) hours as needed for wheezing or shortness of breath., Disp: 1 Inhaler, Rfl: 0 .  allopurinol (ZYLOPRIM) 100 MG tablet, Take 100 mg by mouth daily., Disp: , Rfl:  .  ANORO ELLIPTA 62.5-25 MCG/INH AEPB, Inhale 1 puff into the lungs daily. , Disp: , Rfl:  .  benzonatate (TESSALON) 200 MG capsule, Take 1 capsule (200 mg total) by mouth in the morning, at noon, and at bedtime. 7 DS, Disp: 20 capsule, Rfl: 0 .  Blood Glucose Monitoring Suppl (ACCU-CHEK AVIVA PLUS) w/Device KIT, 1 each by Other route daily. , Disp: , Rfl: 0 .  calcitRIOL (ROCALTROL) 0.5 MCG capsule, Take 1 capsule (0.5 mcg total) by mouth daily., Disp: 30 capsule, Rfl: 0 .  carvedilol (COREG) 6.25 MG tablet, Take 1 tablet (6.25 mg total) by mouth 2 (two) times daily with a meal., Disp: 60 tablet, Rfl: 0 .  clonazePAM (KLONOPIN) 2 MG tablet, Take 1 tablet (2 mg total) by mouth 3 (three) times daily. for anxiety, Disp: 10 tablet, Rfl: 0 .  cyclobenzaprine (FLEXERIL) 10 MG tablet, Take 10 mg by mouth 3 (three) times daily., Disp: , Rfl:  .  diclofenac sodium (VOLTAREN) 1 % GEL, Apply 2 g topically 4 (four) times daily as needed (for pain)., Disp: 1 Tube, Rfl: 0 .  EPINEPHrine 0.3  mg/0.3 mL IJ SOAJ injection, Inject 0.3 mg into the muscle daily as needed (allergic reaction)., Disp: , Rfl:  .  FEROSUL 325 (65 Fe) MG tablet, Take 325 mg by mouth daily., Disp: , Rfl: 3 .  glucose blood test strip, Use as instructed to check blood sugar up to 4 times daily (Patient taking differently: 1 each by Other route See admin instructions. Use as instructed to check blood sugar up to 4 times daily), Disp: 100 each, Rfl: 0 .  guaiFENesin (MUCINEX) 600 MG 12 hr tablet, Take 2 tablets (1,200 mg total) by mouth 2 (two) times daily., Disp: 60 tablet, Rfl: 0 .  ipratropium-albuterol (DUONEB) 0.5-2.5 (3) MG/3ML SOLN, Take 3 mLs by nebulization 4 (four) times daily. , Disp: , Rfl: 3 .  levothyroxine (SYNTHROID, LEVOTHROID) 112 MCG tablet, Take 224 mcg by mouth daily., Disp: , Rfl: 5 .  lisinopril (PRINIVIL,ZESTRIL) 5 MG tablet, Take 1 tablet (5 mg total) by mouth daily. Please call for office visit [phone], Disp: 30 tablet, Rfl: 0 .  loperamide (IMODIUM) 2 MG capsule, Take 1 capsule (2 mg total) by mouth 4 (four) times daily as needed for diarrhea or loose stools., Disp: 12 capsule, Rfl: 0 .  metFORMIN (GLUCOPHAGE) 500 MG tablet, Take 1 tablet (500 mg total) by mouth 2 (two) times daily with a meal., Disp: 30 tablet, Rfl: 1 .  nitroGLYCERIN (NITROSTAT) 0.4 MG SL tablet, Place 1 tablet (  0.4 mg total) under the tongue every 5 (five) minutes as needed for chest pain., Disp: 12 tablet, Rfl: 0 .  nystatin cream (MYCOSTATIN), Apply 1 application topically 2 (two) times daily as needed for dry skin., Disp: 30 g, Rfl: 1 .  omeprazole (PRILOSEC) 20 MG capsule, Take 20 mg by mouth 2 (two) times daily before a meal., Disp: , Rfl:  .  oxyCODONE-acetaminophen (PERCOCET) 10-325 MG tablet, Take 1 tablet by mouth every 6 (six) hours as needed for pain., Disp: , Rfl:  .  OXYGEN, Inhale 2-3 L into the lungs continuous., Disp: , Rfl:  .  potassium chloride SA (K-DUR,KLOR-CON) 20 MEQ tablet, Take 1 tablet (20 mEq  total) by mouth 2 (two) times daily., Disp: 180 tablet, Rfl: 0 .  QC STOOL SOFTENER PLS LAXATIVE 8.6-50 MG tablet, Take 1 tablet by mouth at bedtime., Disp: , Rfl: 2 .  rosuvastatin (CRESTOR) 10 MG tablet, Take 10 mg by mouth every evening., Disp: , Rfl:  .  torsemide (DEMADEX) 20 MG tablet, TAKE 4 (FOUR) TABLETS BY MOUTH DAILY (2AM+ 2NOON), Disp: 120 tablet, Rfl: 5 .  traZODone (DESYREL) 50 MG tablet, Take 100 mg by mouth at bedtime., Disp: , Rfl:    Allergies  Allergen Reactions  . Bee Venom Swelling and Other (See Comments)    "All over my body" (swelling)  . Fioricet [Butalbital-Apap-Caffeine] Nausea And Vomiting and Rash  . Ibuprofen Rash and Other (See Comments)    Severe rash  . Lamisil [Terbinafine] Rash and Other (See Comments)    Pt states this causes her to "feel funny"  . Nsaids Other (See Comments)    Per MD's orders     Past Medical History:  Diagnosis Date  . Anxiety   . Arthritis   . Asthma   . Chronic diastolic CHF (congestive heart failure) ([place])   . COPD (chronic obstructive pulmonary disease) (H[facility])    Uses Oxygen at night  . Depression   . Diabetes mellitus without complication ([place])   . Dyspnea    occ  . Family history of thyroid cancer   . GERD (gastroesophageal reflux disease)   . Gout   . Headache    migraines  . History of DVT of lower extremity   . History of nuclear stress test    Myoview 2/17:  Low risk stress nuclear study with a small, moderate intensity, partially reversible inferior lateral defect consistent with small prior infarct and minimal peri-infarct ischemia; EF 68 with normal wall motion  . Hypertension   . Pneumonia   . Thyroid cancer ([place]) 09/23/2016     Past Surgical History:  Procedure Laterality Date  . BUNIONECTOMY Bilateral   . CARDIAC CATHETERIZATION N/A 06/23/2015   Procedure: Right/Left Heart Cath and Coronary Angiography;  Surgeon: [name], MD;  Location: [facility];  Service: Cardiovascular;   Laterality: N/A;  . COLONOSCOPY WITH PROPOFOL N/A 12/15/2015   Procedure: COLONOSCOPY WITH PROPOFOL;  Surgeon: [name], MD;  Location: [facility];  Service: Endoscopy;  Laterality: N/A;  . RIGHT HEART CATH N/A 10/01/2019   Procedure: RIGHT HEART CATH;  Surgeon: [name], MD;  Location: [facility];  Service: Cardiovascular;  Laterality: N/A;  . THYROIDECTOMY  09/19/2016  . THYROIDECTOMY N/A 09/19/2016   Procedure: TOTAL THYROIDECTOMY;  Surgeon: [name], MD;  Location: [facility];  Service: General;  Laterality: N/A;  . TONSILLECTOMY    . TOTAL ABDOMINAL HYSTERECTOMY  07/14/10    Family History  Problem Relation Age of Onset  . Cancer Father        thought to be due to exposure to concrete  . Diabetes Mother   . Thyroid cancer Mother        dx in her 53s-60s  . Cancer Maternal Uncle        2 uncles with cancer NOS  . Brain cancer Paternal Aunt   . Cancer Cousin        maternal first cousin - NOS  . Cancer Cousin        maternal first cousin - NOS    Social History   Tobacco Use  . Smoking status: Former Smoker    Packs/day: 0.50    Years: 41.00    Pack years: 20.50    Types: Cigarettes    Quit date: 07/2016    Years since quitting: 3.8  . Smokeless tobacco: Never Used  . Tobacco comment: Starting to Wean Off -- using Nicotine Patch  Vaping Use  . Vaping Use: Never used  Substance Use Topics  . Alcohol use: Not Currently    Alcohol/week: 0.0 standard drinks    Comment: ? none now  . Drug use: Never    Comment: none for long time    ROS   Objective:   Vitals: BP (!) 123/55 (BP Location: Right Arm)   Pulse (!) 110   Temp 99.3 F (37.4 C) (Oral)   Wt (!) 303 lb (137.4 kg)   SpO2 97%   BMI 50.42 kg/m   Wt Readings from Last 3 Encounters:  05/24/20 (!) 303 lb (137.4 kg)  04/20/20 (!) 321 lb 14 oz (146 kg)  02/14/20 300 lb (136.1 kg)   Temp Readings from Last 3 Encounters:  05/24/20 99.3 F (37.4 C) (Oral)  04/20/20 98.3 F (36.8 C)  (Oral)  04/13/20 98.8 F (37.1 C) (Oral)   BP Readings from Last 3 Encounters:  05/24/20 (!) 123/55  04/20/20 (!) 102/49  04/13/20 (!) 150/109   Pulse Readings from Last 3 Encounters:  05/24/20 (!) 110  04/20/20 89  04/13/20 89   Pulse recheck was 114-119bpm.   Physical Exam Constitutional:      General: She is not in acute distress.    Appearance: Normal appearance. She is well-developed and normal weight. She is not ill-appearing, toxic-appearing or diaphoretic.  HENT:     Head: Normocephalic and atraumatic.     Right Ear: External ear normal.     Left Ear: External ear normal.     Nose: Nose normal.     Mouth/Throat:     Mouth: Mucous membranes are moist.     Pharynx: Oropharynx is clear.  Eyes:     General: No scleral icterus.    Extraocular Movements: Extraocular movements intact.     Pupils: Pupils are equal, round, and reactive to light.  Cardiovascular:     Rate and Rhythm: Normal rate and regular rhythm.     Pulses: Normal pulses.     Heart sounds: Normal heart sounds. No murmur heard. No friction rub. No gallop.   Pulmonary:     Effort: Pulmonary effort is normal. No respiratory distress.     Breath sounds: Normal breath sounds. No stridor. No wheezing, rhonchi or rales.  Abdominal:     General: Bowel sounds are normal. There is no distension.     Palpations: Abdomen is soft. There is no mass.     Tenderness: There is abdominal tenderness. There is no right CVA tenderness,  left CVA tenderness, guarding or rebound.    Skin:    General: Skin is warm and dry.     Coloration: Skin is not pale.     Findings: No rash.  Neurological:     General: No focal deficit present.     Mental Status: She is alert and oriented to person, place, and time.  Psychiatric:        Mood and Affect: Mood normal.        Behavior: Behavior normal.        Thought Content: Thought content normal.        Judgment: Judgment normal.     Assessment and Plan :   PDMP not  reviewed this encounter.  1. Cellulitis of abdominal wall   2. Tachycardia     Patient is very high risk, was recently hospitalized for being septic, hypoxic. Emphasized need for a higher level of evaluation and care than we can perform in the urgent care setting especially in light of her severe abdominal pain, current tachycardia and recent hospitalization. Contracts for safety, her aide will transport her to the ER now by personal vehicle.    Jaynee Eagles, PA-C 05/24/20 1302

## 2020-05-25 ENCOUNTER — Other Ambulatory Visit: Payer: Self-pay

## 2020-05-25 ENCOUNTER — Other Ambulatory Visit: Payer: Self-pay | Admitting: Obstetrics and Gynecology

## 2020-05-25 NOTE — Patient Outreach (Signed)
Medicaid Managed Care   Nurse Care Manager Note  05/25/2020 Name:  Michele Owens MRN:  [id number] DOB:  10-19-1960  Michele Owens is an 60 y.o. year old female who is a primary patient of [name], [name], MD.  [facility] Managed Care Coordination team was consulted for assistance with:    chronic healthcare management needs.  Michele Owens was given information about Medicaid Managed Care Coordination team services today. [name] agreed to services and verbal consent obtained.  Engaged with patient by telephone for follow up visit in response to provider referral for case management and/or care coordination services.   Assessments/Interventions:  Review of past medical history, allergies, medications, health status, including review of consultants reports, laboratory and other test data, was performed as part of comprehensive evaluation and provision of chronic care management services.  SDOH (Social Determinants of Health) assessments and interventions performed:   Care Plan  Allergies  Allergen Reactions  . Bee Venom Swelling and Other (See Comments)    "All over my body" (swelling)  . Fioricet [Butalbital-Apap-Caffeine] Nausea And Vomiting and Rash  . Ibuprofen Rash and Other (See Comments)    Severe rash  . Lamisil [Terbinafine] Rash and Other (See Comments)    Pt states this causes her to "feel funny"  . Nsaids Other (See Comments)    Per MD's orders     Medications Reviewed Today    Reviewed by [name], RN (Registered Nurse) on 05/25/20 at 1531  Med List Status: <None>  Medication Order Taking? Sig Documenting Provider Last Dose Status Informant  albuterol (PROVENTIL HFA;VENTOLIN HFA) 108 (90 Base) MCG/ACT inhaler [id number] No Inhale 1-2 puffs into the lungs every 6 (six) hours as needed for wheezing or shortness of breath. Long, [name], MD unk Active Multiple Informants  allopurinol (ZYLOPRIM) 100 MG tablet [id number] No Take 100 mg by mouth daily.  [provider] 04/17/2020 Active Multiple Informants           Med Note [name], [name]   Tue Oct 23, 2017  2:58 AM)    [name] ELLIPTA 62.5-25 MCG/INH AEPB [id number] No Inhale 1 puff into the lungs daily.  [provider] 04/18/2020 Unknown time Active Multiple Informants  benzonatate (TESSALON) 200 MG capsule [id number] No Take 1 capsule (200 mg total) by mouth in the morning, at noon, and at bedtime. 7 DS Niel Hummer A, MD 04/16/2020 Active Multiple Informants  Blood Glucose Monitoring Suppl (ACCU-CHEK AVIVA PLUS) w/Device KIT [id number] No 1 each by Other route daily.  [provider] unk unk Active Multiple Informants  calcitRIOL (ROCALTROL) 0.5 MCG capsule [id number] No Take 1 capsule (0.5 mcg total) by mouth daily. Modena Jansky, MD 04/17/2020 Active Multiple Informants           Med Note [name], [name]   Tue Oct 23, 2017  2:59 AM)    carvedilol (COREG) 6.25 MG tablet [id number] No Take 1 tablet (6.25 mg total) by mouth 2 (two) times daily with a meal. Bensimhon, [name], MD 04/17/2020 1700 Active Multiple Informants  clindamycin (CLEOCIN) 150 MG capsule [id number]  Take 2 capsules (300 mg total) by mouth 3 (three) times daily for 7 days. Caccavale, Sophia, PA-C  Active   clonazePAM (KLONOPIN) 2 MG tablet [id number] No Take 1 tablet (2 mg total) by mouth 3 (three) times daily. for anxiety [name], MD 04/18/2020 Unknown time Active Multiple Informants  cyclobenzaprine (FLEXERIL) 10 MG tablet [id number] No Take 10 mg by mouth 3 (three)  times daily. [provider] 04/17/2020 Active Multiple Informants  diclofenac sodium (VOLTAREN) 1 % GEL [id number] No Apply 2 g topically 4 (four) times daily as needed (for pain). [name], MD Past Week Unknown time Active Multiple Informants  EPINEPHrine 0.3 mg/0.3 mL IJ SOAJ injection [id number] No Inject 0.3 mg into the muscle daily as needed (allergic reaction). [provider] unk Active Multiple Informants   FEROSUL 325 (65 Fe) MG tablet [id number] No Take 325 mg by mouth daily. [provider] 04/17/2020 Active Multiple Informants           Med Note [name], [name]   Tue Oct 23, 2017  2:57 AM)    glucose blood test strip [id number] No Use as instructed to check blood sugar up to 4 times daily  Patient taking differently: 1 each by Other route See admin instructions. Use as instructed to check blood sugar up to 4 times daily   [name], DO unk unk Active Multiple Informants  guaiFENesin (MUCINEX) 600 MG 12 hr tablet [id number] No Take 2 tablets (1,200 mg total) by mouth 2 (two) times daily. Regalado, Belkys A, MD unk Active Multiple Informants  ipratropium-albuterol (DUONEB) 0.5-2.5 (3) MG/3ML SOLN [id number] No Take 3 mLs by nebulization 4 (four) times daily.  [provider] unk Active Multiple Informants           Med Note [name], [name]   Tue Oct 23, 2017  2:57 AM)    levothyroxine (SYNTHROID, LEVOTHROID) 112 MCG tablet [id number] No Take 224 mcg by mouth daily. [provider] 04/18/2020 Unknown time Active Multiple Informants           Med Note [name], [name]   Tue Oct 23, 2017  2:57 AM)    lisinopril (PRINIVIL,ZESTRIL) 5 MG tablet [id number] No Take 1 tablet (5 mg total) by mouth daily. Please call for office visit [phone] [name], MD 04/18/2020 Unknown time Active Multiple Informants           Med Note [name], [name]   Tue Oct 23, 2017  2:58 AM)    loperamide (IMODIUM) 2 MG capsule [id number] No Take 1 capsule (2 mg total) by mouth 4 (four) times daily as needed for diarrhea or loose stools. [name], MD unk Active Multiple Informants  metFORMIN (GLUCOPHAGE) 500 MG tablet [id number] No Take 1 tablet (500 mg total) by mouth 2 (two) times daily with a meal. [name], MD 04/17/2020 Active Multiple Informants           Med Note [name], [name]   Tue Oct 23, 2017  2:58 AM)    nitroGLYCERIN (NITROSTAT) 0.4 MG SL tablet [id number] No Place  1 tablet (0.4 mg total) under the tongue every 5 (five) minutes as needed for chest pain. [name], MD unk Active Multiple Informants  nystatin cream (MYCOSTATIN) [id number] No Apply 1 application topically 2 (two) times daily as needed for dry skin. Regalado, Belkys A, MD 04/16/2020 Active Multiple Informants  omeprazole (PRILOSEC) 20 MG capsule [id number] No Take 20 mg by mouth 2 (two) times daily before a meal. [provider] 04/17/2020 Active Multiple Informants  oxyCODONE-acetaminophen (PERCOCET) 10-325 MG tablet [id number] No Take 1 tablet by mouth every 6 (six) hours as needed for pain. [provider] 04/17/2020 Active Multiple Informants           Med Note Marcellina Millin Apr 12, 2020  1:22 PM) LF on 03-25-20 # 120  DS 30  OXYGEN [id number] No Inhale 2-3 L into the lungs continuous. [provider] 01/14/2020 Unknown time Active Multiple Informants           Med Note Corky Mull   Mon Apr 12, 2020 10:08 AM)    potassium chloride SA (K-DUR,KLOR-CON) 20 MEQ tablet [id number] No Take 1 tablet (20 mEq total) by mouth 2 (two) times daily. [name], MD 04/17/2020 Active Multiple Informants  QC STOOL SOFTENER PLS LAXATIVE 8.6-50 MG tablet [id number] No Take 1 tablet by mouth at bedtime. [provider] 04/17/2020 Active Multiple Informants           Med Note [name], [name]   Tue Oct 23, 2017  2:56 AM)    rosuvastatin (CRESTOR) 10 MG tablet [id number] No Take 10 mg by mouth every evening. [provider] 04/16/2020 Active Multiple Informants  torsemide (DEMADEX) 20 MG tablet [id number]  TAKE 4 (FOUR) TABLETS BY MOUTH DAILY (2AM+ 2NOON) [name], MD  Active   traZODone (DESYREL) 50 MG tablet [id number] No Take 100 mg by mouth at bedtime. [provider] 04/17/2020 Active Multiple Informants          Patient Active Problem List   Diagnosis Date Noted  . Acute on chronic respiratory failure with hypoxia and hypercapnia  ([place]) 04/18/2020  . Acute on chronic respiratory failure with hypoxia ([place]) 04/18/2020  . Hemoptysis 04/18/2020  . Acute metabolic encephalopathy 09/38/1829  . Acute encephalopathy 04/18/2020  . Chronic diastolic CHF (congestive heart failure) ([place]) 04/18/2020  . Sepsis ([place]) 04/18/2020  . Morbid obesity with BMI of 50.0-59.9, adult ([place])   . Fall at home, initial encounter 06/23/2018  . HNP (herniated nucleus pulposus), thoracic 06/23/2018  . CHF (congestive heart failure) ([place]) 06/18/2018  . Hypertensive disorder 08/28/2017  . Obesity 08/28/2017  . COPD exacerbation ([place]) 08/04/2017  . Acute and chronic respiratory failure with hypercapnia ([place])   . Pulmonary HTN ([place])   . Genetic testing 02/27/2017  . Family history of thyroid cancer   . Hypomagnesemia 10/03/2016  . Atypical chest pain   . HLD (hyperlipidemia) 09/23/2016  . Gout 09/23/2016  . DVT (deep vein thrombosis) in pregnancy 09/23/2016  . Thyroid cancer ([place]) 09/23/2016  . Hypocalcemia 09/23/2016  . AKI (acute kidney injury) ([place]) 09/23/2016  . Acute pulmonary edema ([facility])   . Acute hypoxemic respiratory failure ([place])   . Ventilator dependent ([place])   . Chronic respiratory failure with hypoxia and hypercapnia ([place]) 07/16/2016  . CAP (community acquired pneumonia) 01/23/2016  . 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 Pulmonary hypertension ([place]) 01/23/2016  . Obesity hypoventilation syndrome ([place]) 08/26/2015  . Dyslipidemia associated with type 2 diabetes mellitus ([place]) 04/24/2015  . Leukocytosis 04/24/2015  . Controlled type 2 diabetes mellitus without complication, without long-term current use of insulin ([place]) 04/24/2015  . Anxiety 04/24/2015  . Benign essential HTN 04/24/2015  . Normocytic anemia 04/24/2015  . OSA (obstructive sleep apnea) 05/10/2012  . Tobacco abuse 07/04/2011    Conditions to be addressed/monitored per PCP order:  chronic healthcare management needs-OSA, DM2,  obesity, CHF  Care Plan : General Plan of Care (Adult)  Updates made by [name], RN since 05/25/2020 12:00 AM    Problem: Health Promotion or Disease Self-Management (General Plan of Care)   Priority: High  Onset Date: 04/23/2020    Long-Range Goal: Self-Management Plan Developed   Start Date: 04/23/2020  Expected End Date: 07/22/2020  Recent Progress: Not on  track  Priority: Medium  Note:   Current Barriers:  . Chronic Disease Management support and education needs.  Patient with abdominal wall infection-seen at [facility] ED 05/24/20 - I&D performed.  Nurse Case Manager Clinical Goal(s):  Marland Kitchen Over the next 30 days, patient will attend all scheduled medical appointments:  . Over the next 30 days, patient will work with CM team pharmacist to review medications. Marland Kitchen Update 05/20/20:  Patient has appointment with Pharmacist 05/21/20. Marland Kitchen Update 05/25/20:  Patient declines to speak with Pharmacist at this time.  Interventions:  . Inter-disciplinary care team collaboration (see longitudinal plan of care) . Evaluation of current treatment plan and patient's adherence to plan as established by provider. . Reviewed medications with patient. [name] with pharmacist regarding medications. . Discussed plans with patient for ongoing care management follow up and provided patient with direct contact information for care management team . Reviewed scheduled/upcoming provider appointments.  Marland Kitchen Update 05/20/20:  Patient unable to attend PT appointment 05/19/20, has next appointment 05/26/20.  Has Cardiology appointment 05/28/20.  Reviewed [place] information with patient again and with aide. Marland Kitchen Pharmacy referral for medication review. Marland Kitchen Update 05/24/20: Pharmacist has attempted to speak with patient and has been unable to.  Patient at Urgent Care today with stomach pain.  RNCM will reschedule appointment with patient and speak with her regarding importance of speaking with Pharmacist. . Update  05/25/20: Patient declines to speak with Pharmacist at this time.  Patient Goals/Self-Care Activities Over the next 30 days, patient will:  -Attends all scheduled provider appointments Calls pharmacy for medication refills Calls provider office for new concerns or questions  Follow Up Plan: The Managed Medicaid care management team will reach out to the patient again over the next 30 days.  The patient has been provided with contact information for the Managed Medicaid care management team and has been advised to call with any health related questions or concerns.     Follow Up:  Patient agrees to Care Plan and Follow-up.  Plan: The Managed Medicaid care management team will reach out to the patient again over the next 30 days. and The patient has been provided with contact information for the Managed Medicaid care management team and has been advised to call with any health related questions or concerns.  Date/time of next scheduled RN care management/care coordination outreach:  05/31/20.

## 2020-05-25 NOTE — ED Notes (Signed)
Pt called out, states her abdomen is hurting and her chest feels like it is fluttering.   Monitor placed on pt, EKG obtained and shown to provider.  Provider at bedside conversing with pt.

## 2020-05-25 NOTE — Patient Instructions (Signed)
Visit Information  Michele Owens was given information about Medicaid Managed Care team care coordination services as a part of their [facility] Medicaid benefit. Michele Owens verbally consented to engagement with the [facility] Managed Care team.   For questions related to your [facility] health plan, please call: [phone]  If you would like to schedule transportation through your [facility] plan, please call the following number at least 2 days in advance of your appointment: [phone]  Michele Owens - following are the goals we discussed in your visit today:  Goals Addressed            This Visit's Progress   . Protect My Health       Timeframe:  Long-Range Goal Priority:  Medium Start Date:    04/23/20                         Expected End Date:       07/22/20                Follow Up Date 05/24/20   - schedule appointment for vaccines needed due to my age or health - schedule recommended health tests (blood work, mammogram, colonoscopy, pap test) - schedule and keep appointment for annual check-up  Patient has PT appointment 05/19/20. Update 05/20/20:  Patient unable to attend PT appointment on 05/19/20.  Has next PT appointment 05/26/20.  Cardiology appointment 05/28/20. Update 05/24/20:  Attempted to reach patient today-she is at Urgent Care with stomach pain.  Will reschedule. Update 05/25/20:  Patient seen at Urgent Care and [place] 05/24/20.  Patient with abscess- given antibiotics.        Patient verbalizes understanding of instructions provided today.   The Managed Medicaid care management team will reach out to the patient again over the next 30 days.  The patient has been provided with contact information for the Managed Medicaid care management team and has been advised to call with any health related questions or concerns.   Michele Raider RN, BSN [place] Management Coordinator - Managed Medicaid High  Risk [phone]  Following is a copy of your plan of care:  Patient Care Plan: General Plan of Care (Adult)    Problem Identified: Health Promotion or Disease Self-Management (General Plan of Care)       Problem Identified: Health Promotion or Disease Self-Management (General Plan of Care)     Priority: High  Onset Date: 04/23/2020    Long-Range Goal: Self-Management Plan Developed   Start Date: 04/23/2020  Expected End Date: 07/22/2020  Recent Progress: Not on track  Priority: Medium  Note:   Current Barriers:  . Chronic Disease Management support and education needs.  Nurse Case Manager Clinical Goal(s):  Marland Kitchen Over the next 30 days, patient will attend all scheduled medical appointments:  . Over the next 30 days, patient will work with CM team pharmacist to review medications. Marland Kitchen Update 05/25/20:  Patient declines to speak with Pharmacist at this time. Marland Kitchen Update 05/20/20:  Patient has appointment with Pharmacist 05/21/20.  Interventions:  . Inter-disciplinary care team collaboration (see longitudinal plan of care) . Evaluation of current treatment plan and patient's adherence to plan as established by provider. . Reviewed medications with patient. Michele Owens with pharmacist regarding medications. . Discussed plans with patient for ongoing care management follow up and provided patient with direct contact information for care management team . Reviewed scheduled/upcoming provider appointments.  Marland Kitchen Update 05/20/20:  Patient unable to attend PT appointment 05/19/20, has next appointment 05/26/20.  Has Cardiology appointment 05/28/20.  Reviewed [place] information with patient again and with aide. Marland Kitchen Pharmacy referral for medication review. Marland Kitchen Update 05/24/20: Pharmacist has attempted to speak with patient and has been unable to.  Patient at Urgent Care today with stomach pain.  RNCM will reschedule appointment with patient and speak with her regarding importance of speaking  with Pharmacist.  Patient Goals/Self-Care Activities Over the next 30 days, patient will:  -Attends all scheduled provider appointments Calls pharmacy for medication refills Calls provider office for new concerns or questions  Follow Up Plan: The Managed Medicaid care management team will reach out to the patient again over the next 30 days.  The patient has been provided with contact information for the Managed Medicaid care management team and has been advised to call with any health related questions or concerns.

## 2020-05-25 NOTE — ED Provider Notes (Signed)
Patient seen by previous treatment team for cutaneous abscess, I&D, waiting for PTAR for transportation home. Per RN, patient started to complain that her heart was racing. She reports to me she has had similar symptoms previously.   EKG NSR, normal rate, infrequent PVCs present. Patient reassured. She appears comfortable. Asking for applesauce and a drink.    Charlann Lange, PA-C 05/25/20 [id number]    Ripley Fraise, MD 05/25/20 [phone]

## 2020-05-26 ENCOUNTER — Ambulatory Visit: Payer: Medicaid Other | Admitting: Physical Therapy

## 2020-05-27 ENCOUNTER — Other Ambulatory Visit: Payer: Self-pay

## 2020-05-27 NOTE — Patient Outreach (Signed)
Medicaid Managed Care Social Work Note  05/27/2020 Name:  Michele Owens MRN:  [id number] DOB:  15-Jun-1960  Michele Owens is an 60 y.o. year old female who is a primary patient of [name], [name], Owens.  The Medicaid Managed Care Coordination team was consulted for assistance with:  [place]  Michele Owens was given information about Medicaid Managed CareCoordination services today. Michele Owens agreed to services and verbal consent obtained.  Engaged with patient  for by telephone forinitial visit in response to referral for case management and/or care coordination services.   Assessments/Interventions:  Review of past medical history, allergies, medications, health status, including review of consultants reports, laboratory and other test data, was performed as part of comprehensive evaluation and provision of chronic care management services.  SDOH: (Social Determinant of Health) assessments and interventions performed:   BSW spoke with patient about PCS, she stated someone usually comes in 3 days a week for a few hours. She stated her aide was off today. BSW contacted Michele Owens and spoke with Michele Owens, she stated that patient has 13 hours per week with 6 days a week, however patient chooses to have an aide come out 3 days a week so that she can have more hours. The aide assist with most ADL's.  Advanced Directives Status:  Not addressed in this encounter.  Care Plan                 Allergies  Allergen Reactions  . Bee Venom Swelling and Other (See Comments)    "All over my body" (swelling)  . Fioricet [Butalbital-Apap-Caffeine] Nausea And Vomiting and Rash  . Ibuprofen Rash and Other (See Comments)    Severe rash  . Lamisil [Terbinafine] Rash and Other (See Comments)    Pt states this causes her to "feel funny"  . Nsaids Other (See Comments)    Per Owens's orders     Medications Reviewed Today    Reviewed by Michele Medicus, RN (Registered Nurse)  on 05/25/20 at 1531  Med List Status: <None>  Medication Order Taking? Sig Documenting Provider Last Dose Status Informant  albuterol (PROVENTIL HFA;VENTOLIN HFA) 108 (90 Base) MCG/ACT inhaler [id number] No Inhale 1-2 puffs into the lungs every 6 (six) hours as needed for wheezing or shortness of breath. Michele Owens  allopurinol (ZYLOPRIM) 100 MG tablet [id number] No Take 100 mg by mouth daily. Provider, Historical, Owens 04/17/2020 Active Multiple Owens           Med Note [name], [name]   Tue Oct 23, 2017  2:58 AM)    [name] ELLIPTA 62.5-25 MCG/INH AEPB [id number] No Inhale 1 puff into the lungs daily.  Provider, Historical, Owens 04/18/2020 Unknown time Active Multiple Owens  benzonatate (TESSALON) 200 MG capsule [id number] No Take 1 capsule (200 mg total) by mouth in the morning, at Owens, and at bedtime. 7 DS Niel Hummer A, Owens 04/16/2020 Active Multiple Owens  Blood Glucose Monitoring Suppl (ACCU-CHEK AVIVA PLUS) w/Device KIT [id number] No 1 each by Other route daily.  Provider, Historical, Owens unk unk Active Multiple Owens  calcitRIOL (ROCALTROL) 0.5 MCG capsule [id number] No Take 1 capsule (0.5 mcg total) by mouth daily. Modena Jansky, Owens 04/17/2020 Active Multiple Owens           Med Note [name], [name]   Tue Oct 23, 2017  2:59 AM)    carvedilol (COREG) 6.25 MG tablet [id number] No Take 1 tablet (6.25  mg total) by mouth 2 (two) times daily with a meal. Bensimhon, [name], Owens 04/17/2020 1700 Active Multiple Owens  clindamycin (CLEOCIN) 150 MG capsule [id number]  Take 2 capsules (300 mg total) by mouth 3 (three) times daily for 7 days. Caccavale, Sophia, PA-C  Active   clonazePAM (KLONOPIN) 2 MG tablet [id number] No Take 1 tablet (2 mg total) by mouth 3 (three) times daily. for anxiety [name], Owens 04/18/2020 Unknown time Active Multiple Owens  cyclobenzaprine (FLEXERIL) 10 MG tablet [id number] No Take 10 mg by mouth 3 (three) times  daily. Provider, Historical, Owens 04/17/2020 Active Multiple Owens  diclofenac sodium (VOLTAREN) 1 % GEL [id number] No Apply 2 g topically 4 (four) times daily as needed (for pain). [name], Owens Past Week Unknown time Active Multiple Owens  EPINEPHrine 0.3 mg/0.3 mL IJ SOAJ injection [id number] No Inject 0.3 mg into the muscle daily as needed (allergic reaction). Provider, Historical, Owens unk Active Multiple Owens  FEROSUL 325 (65 Fe) MG tablet [id number] No Take 325 mg by mouth daily. Provider, Historical, Owens 04/17/2020 Active Multiple Owens           Med Note [name], [name]   Tue Oct 23, 2017  2:57 AM)    glucose blood test strip [id number] No Use as instructed to check blood sugar up to 4 times daily  Patient taking differently: 1 each by Other route See admin instructions. Use as instructed to check blood sugar up to 4 times daily   [name], DO unk unk Active Multiple Owens  guaiFENesin (MUCINEX) 600 MG 12 hr tablet [id number] No Take 2 tablets (1,200 mg total) by mouth 2 (two) times daily. Regalado, Belkys A, Owens unk Active Multiple Owens  ipratropium-albuterol (DUONEB) 0.5-2.5 (3) MG/3ML SOLN [id number] No Take 3 mLs by nebulization 4 (four) times daily.  Provider, Historical, Owens unk Active Multiple Owens           Med Note [name], [name]   Tue Oct 23, 2017  2:57 AM)    levothyroxine (SYNTHROID, LEVOTHROID) 112 MCG tablet [id number] No Take 224 mcg by mouth daily. Provider, Historical, Owens 04/18/2020 Unknown time Active Multiple Owens           Med Note [name], [name]   Tue Oct 23, 2017  2:57 AM)    lisinopril (PRINIVIL,ZESTRIL) 5 MG tablet [id number] No Take 1 tablet (5 mg total) by mouth daily. Please call for office visit [phone] [name], Owens 04/18/2020 Unknown time Active Multiple Owens           Med Note [name], [name]   Tue Oct 23, 2017  2:58 AM)    loperamide (IMODIUM) 2 MG capsule [id number] No Take 1 capsule (2 mg total)  by mouth 4 (four) times daily as needed for diarrhea or loose stools. [name], Owens unk Active Multiple Owens  metFORMIN (GLUCOPHAGE) 500 MG tablet [id number] No Take 1 tablet (500 mg total) by mouth 2 (two) times daily with a meal. [name], Owens 04/17/2020 Active Multiple Owens           Med Note [name], [name]   Tue Oct 23, 2017  2:58 AM)    nitroGLYCERIN (NITROSTAT) 0.4 MG SL tablet [id number] No Place 1 tablet (0.4 mg total) under the tongue every 5 (five) minutes as needed for chest pain. [name], Owens unk Active Multiple Owens  nystatin cream (MYCOSTATIN) [id number] No Apply 1 application topically 2 (two) times daily as needed  for dry skin. Regalado, Belkys A, Owens 04/16/2020 Active Multiple Owens  omeprazole (PRILOSEC) 20 MG capsule [id number] No Take 20 mg by mouth 2 (two) times daily before a meal. Provider, Historical, Owens 04/17/2020 Active Multiple Owens  oxyCODONE-acetaminophen (PERCOCET) 10-325 MG tablet [id number] No Take 1 tablet by mouth every 6 (six) hours as needed for pain. Provider, Historical, Owens 04/17/2020 Active Multiple Owens           Med Note Marcellina Millin Apr 12, 2020  1:22 PM) LF on 03-25-20 # 120 DS 30  OXYGEN [id number] No Inhale 2-3 L into the lungs continuous. Provider, Historical, Owens 01/14/2020 Unknown time Active Multiple Owens           Med Note Corky Mull   Mon Apr 12, 2020 10:08 AM)    potassium chloride SA (K-DUR,KLOR-CON) 20 MEQ tablet [id number] No Take 1 tablet (20 mEq total) by mouth 2 (two) times daily. [name], Owens 04/17/2020 Active Multiple Owens  QC STOOL SOFTENER PLS LAXATIVE 8.6-50 MG tablet [id number] No Take 1 tablet by mouth at bedtime. Provider, Historical, Owens 04/17/2020 Active Multiple Owens           Med Note [name], [name]   Tue Oct 23, 2017  2:56 AM)    rosuvastatin (CRESTOR) 10 MG tablet [id number] No Take 10 mg by mouth every evening. Provider, Historical, Owens 04/16/2020  Active Multiple Owens  torsemide (DEMADEX) 20 MG tablet [id number]  TAKE 4 (FOUR) TABLETS BY MOUTH DAILY (2AM+ 2NOON) [name], Owens  Active   traZODone (DESYREL) 50 MG tablet [id number] No Take 100 mg by mouth at bedtime. Provider, Historical, Owens 04/17/2020 Active Multiple Owens          Patient Active Problem List   Diagnosis Date Noted  . Acute on chronic respiratory failure with hypoxia and hypercapnia ([place]) 04/18/2020  . Acute on chronic respiratory failure with hypoxia ([place]) 04/18/2020  . Hemoptysis 04/18/2020  . Acute metabolic encephalopathy 26/94/8546  . Acute encephalopathy 04/18/2020  . Chronic diastolic CHF (congestive heart failure) ([place]) 04/18/2020  . Sepsis ([place]) 04/18/2020  . Morbid obesity with BMI of 50.0-59.9, adult ([place])   . Fall at home, initial encounter 06/23/2018  . HNP (herniated nucleus pulposus), thoracic 06/23/2018  . CHF (congestive heart failure) ([place]) 06/18/2018  . Hypertensive disorder 08/28/2017  . Obesity 08/28/2017  . COPD exacerbation ([place]) 08/04/2017  . Acute and chronic respiratory failure with hypercapnia ([place])   . Pulmonary HTN ([place])   . Genetic testing 02/27/2017  . Family history of thyroid cancer   . Hypomagnesemia 10/03/2016  . Atypical chest pain   . HLD (hyperlipidemia) 09/23/2016  . Gout 09/23/2016  . DVT (deep vein thrombosis) in pregnancy 09/23/2016  . Thyroid cancer ([place]) 09/23/2016  . Hypocalcemia 09/23/2016  . AKI (acute kidney injury) ([place]) 09/23/2016  . Acute pulmonary edema (H[facility])   . Acute hypoxemic respiratory failure ([place])   . Ventilator dependent ([place])   . Chronic respiratory failure with hypoxia and hypercapnia ([place]) 07/16/2016  . CAP (community acquired pneumonia) 01/23/2016  . 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 Pulmonary hypertension ([place]) 01/23/2016  . Obesity hypoventilation syndrome ([place]) 08/26/2015  . Dyslipidemia associated with type 2 diabetes mellitus ([place])  04/24/2015  . Leukocytosis 04/24/2015  . Controlled type 2 diabetes mellitus without complication, without long-term current use of insulin ([place]) 04/24/2015  . Anxiety 04/24/2015  . Benign essential HTN 04/24/2015  . Normocytic anemia 04/24/2015  .  OSA (obstructive sleep apnea) 05/10/2012  . Tobacco abuse 07/04/2011    Conditions to be addressed/monitored per PCP order:  PCS  Care Plan : General Plan of Care (Adult)  Updates made by Ethelda Chick since 05/27/2020 12:00 AM    Problem: Barriers to Treatment     Long-Range Goal: Barriers to Treatment Identified and Managed   Start Date: 05/03/2020  Expected End Date: 08/01/2020  Recent Progress: Not on track  Priority: High  Note:   [place] (see longitudinal plan of care for additional care plan information)  Current Barriers:  . Transportation barriers. . Limited access to caregiver-patient needs PCS, currently not receiving services she is expecting.  Nurse Clinical Goal(s):  Marland Kitchen Over the next 30 days, patient will work with [employer] to address needs related to transportation. . Over the next day  patient will follow up with [place] as directed by [facility]. Marland Kitchen Update 05/12/20:  Patient has used [employer] and is very pleased. . Over the next 90 days, patient will attend all scheduled medical appointments. Marland Kitchen RNCM will make referral to SW for assistance with PCS . Update 05/20/20:  Patient states she is now receiving PCS from [facility] Owens 3 days a week for 4 hours each day. Marland Kitchen Update 05/27/20: BSW spoke with patient about PCS, she stated someone usually comes in 3 days a week for a few hours. She stated her aide was off today. BSW contacted [name] and spoke with [facility] Owens, she stated that patient has 13 hours per week with 6 days a week, however patient chooses to have an aide come out 3 days a week so that she can have more hours. The aide  assist with most ADL's.  Interventions: . Inter-disciplinary care team collaboration (see longitudinal plan of care) . Referred patient to community resources care guide team for assistance with transportation. . Provided patient with information about [employer]. . Discussed plans with patient for ongoing care management follow up and provided patient with direct contact information for care management team . Advised patient to call [place]. Marland Kitchen Collaborated with [facility] regarding transportation. . Assisted patient/caregiver with obtaining information about health plan benefits . Collaborated with Social Work regarding [employer].  Patient Self Care Activities:  . Patient will call [employer].        Follow up:  Patient agrees to Care Plan and Follow-up.  Plan: The Managed Medicaid care management team will reach out to the patient again over the next 30 days.     Mickel Fuchs, BSW, [place]  High Risk Managed Medicaid Team

## 2020-05-27 NOTE — Patient Instructions (Signed)
Visit Information  Ms. Dike was given information about Medicaid Managed Care team care coordination services as a part of their [facility] Medicaid benefit. [name] verbally consented to engagement with the [facility] Managed Care team.   For questions related to your [facility] health plan, please call: [phone]  If you would like to schedule transportation through your [facility] plan, please call the following number at least 2 days in advance of your appointment: [phone]  Ms. Brooks Sailors - following are the goals we discussed in your visit today:  Goals Addressed   None       The Managed Medicaid care management team will reach out to the patient again over the next 30 days.   Ethelda Chick  Following is a copy of your plan of care:

## 2020-05-28 ENCOUNTER — Telehealth (HOSPITAL_COMMUNITY): Payer: Self-pay

## 2020-05-28 ENCOUNTER — Encounter (HOSPITAL_COMMUNITY): Payer: Medicaid Other | Admitting: Cardiology

## 2020-05-28 NOTE — Telephone Encounter (Signed)
Patient called and left a message on the triage vm stating that she was seen in there ER yesterday and had a procedure done. I think patient was stating that she wouldn't be able to come into the office today to be seen by Dr. Aundra Dubin. Tried calling patient to get more information,lmtrc

## 2020-05-31 ENCOUNTER — Other Ambulatory Visit: Payer: Self-pay | Admitting: Obstetrics and Gynecology

## 2020-05-31 NOTE — Patient Instructions (Signed)
Hi Ms. Burry, sorry we missed you today  - as a part of your Medicaid benefit, you are eligible for care management and care coordination services at no cost or copay. I was unable to reach you by phone today but would be happy to help you with your health related needs. Please feel free to call me at [phone].  A member of the Managed Medicaid care management team will reach out to you again over the next 7 days.   [name] RN, BSN [place]  [facility] Curator - Managed Medicaid High Risk [phone].

## 2020-05-31 NOTE — Patient Outreach (Signed)
Care Coordination  05/31/2020  Michele Owens Sep 29, 1960 [id number]    Medicaid Managed Care   Unsuccessful Outreach Note  05/31/2020 Name: Michele Owens MRN: [id number] DOB: 1960/05/30  Referred by: Everrett Coombe, MD Reason for referral : High Risk Managed Medicaid (Unsuccessful telephone outreach)   An unsuccessful telephone outreach was attempted today. The patient was referred to the case management team for assistance with care management and care coordination.   Follow Up Plan: A member of the Managed Medicaid  care management team will reach out to the patient again over the next 7 days.   [name] RN, BSN [place]  Tri[facility] Curator - Managed Medicaid High Risk [phone].

## 2020-06-01 ENCOUNTER — Other Ambulatory Visit: Payer: Self-pay | Admitting: Obstetrics and Gynecology

## 2020-06-01 ENCOUNTER — Other Ambulatory Visit: Payer: Self-pay

## 2020-06-01 NOTE — Patient Instructions (Signed)
Visit Information  Ms. Woodrum was given information about Medicaid Managed Care team care coordination services as a part of their [facility] Medicaid benefit. [name] verbally consented to engagement with the [facility] Managed Care team.   For questions related to your [facility] health plan, please call: [phone]  If you would like to schedule transportation through your [facility] plan, please call the following number at least 2 days in advance of your appointment: [phone]  Ms. Cranmore - following are the goals we discussed in your visit today:  Goals Addressed            This Visit's Progress   . Protect My Health       Timeframe:  Long-Range Goal Priority:  Medium Start Date:    04/23/20                         Expected End Date:       07/22/20                Follow Up Date 2/28 /22   - schedule appointment for vaccines needed due to my age or health - schedule recommended health tests (blood work, mammogram, colonoscopy, pap test) - schedule and keep appointment for annual check-up  Patient has PT appointment 05/19/20. Update 05/20/20:  Patient unable to attend PT appointment on 05/19/20.  Has next PT appointment 05/26/20.  Cardiology appointment 05/28/20. Update 05/24/20:  Attempted to reach patient today-she is at Urgent Care with stomach pain.  Will reschedule. Update 05/25/20:  Patient seen at Urgent Care and [place] 05/24/20.  Patient with abscess- given antibiotics. Update 06/01/20:  Patient continues to improve, HHRN cleaning and changing dressing.       Patient verbalizes understanding of instructions provided today.   The Managed Medicaid care management team will reach out to the patient again over the next 7 days.  The patient has been provided with contact information for the Managed Medicaid care management team and has been advised to call with any health related questions or concerns.   [name] RN, BSN [place] Management Coordinator - Managed Medicaid High Risk [phone]  Following is a copy of your plan of care:  Patient Care Plan: General Plan of Care (Adult)    Problem Identified: Health Promotion or Disease Self-Management (General Plan of Care)   Priority: Medium  Onset Date: 04/23/2020    Problem Identified: Barriers to Treatment     Long-Range Goal: Barriers to Treatment Identified and Managed   Start Date: 05/03/2020  Expected End Date: 08/01/2020  Recent Progress: Not on track  Priority: High  Note:   CARE PLAN ENTRY Medicaid Managed Care (see longitudinal plan of care for additional care plan information)  Current Barriers:  . Transportation barriers-patient utilizing [employer] transportation as needed. . Limited access to caregiver-patient needs PCS, currently not receiving services she is expecting.  Patient states she is now receiving PCS.  Nurse Clinical Goal(s):  Marland Kitchen Over the next 30 days, patient will work with [employer] to address needs related to transportation. Marland Kitchen Update 05/12/20:  Patient has used [employer] and is very pleased. . Over the next 90 days, patient will attend all scheduled medical appointments. Marland Kitchen RNCM will make referral to SW for assistance with PCS . Update 05/20/20:  Patient states she is now receiving PCS from [facility] 3 days a week for 4 hours each day. Marland Kitchen Update  05/27/20: BSW spoke with patient about PCS, she stated someone usually comes in 3 days a week for a few hours. She stated her aide was off today. BSW contacted [place] and spoke with [facility], she stated that patient has 13 hours per week with 6 days a week, however patient chooses to have an aide come out 3 days a week so that she can have more hours. The aide assist with most ADL's.  Interventions: . Inter-disciplinary care team collaboration (see longitudinal plan of care) . Referred patient to community resources care guide  team for assistance with transportation. . Provided patient with information about [employer]. . Discussed plans with patient for ongoing care management follow up and provided patient with direct contact information for care management team . Advised patient to call [place]. Marland Kitchen Collaborated with [facility] regarding transportation. . Assisted patient/caregiver with obtaining information about health plan benefits . Collaborated with Social Work regarding [employer].  Patient Self Care Activities:  . Patient will call [employer].          Patient Care Plan: General Plan of Care (Adult)    Problem Identified: Health Promotion or Disease Self-Management (General Plan of Care)   Priority: High  Onset Date: 04/23/2020    Long-Range Goal: Self-Management Plan Developed   Start Date: 04/23/2020  Expected End Date: 07/22/2020  Recent Progress: Not on track  Priority: Medium  Note:   Current Barriers:  . Chronic Disease Management support and education needs.  Patient with abdominal wall infection-seen at [facility] ED 05/24/20 - I&D performed. Marland Kitchen Update 06/01/20:  Patient states she is improving-HHRN is cleaning and dressing infection.  Nurse Case Manager Clinical Goal(s):  Marland Kitchen Over the next 30 days, patient will attend all scheduled medical appointments:  . Over the next 30 days, patient will work with CM team pharmacist to review medications. Marland Kitchen Update 05/20/20:  Patient has appointment with Pharmacist 05/21/20. Marland Kitchen Update 05/25/20:  Patient declines to speak with Pharmacist at this time.  Interventions:  . Inter-disciplinary care team collaboration (see longitudinal plan of care) . Evaluation of current treatment plan and patient's adherence to plan as established by provider. . Reviewed medications with patient. [name] with pharmacist regarding medications. . Discussed plans with patient for ongoing care management follow up and provided patient with direct  contact information for care management team . Reviewed scheduled/upcoming provider appointments.  Marland Kitchen Update 05/20/20:  Patient unable to attend PT appointment 05/19/20, has next appointment 05/26/20.  Has Cardiology appointment 05/28/20.  Reviewed [place] information with patient again and with aide. Marland Kitchen Pharmacy referral for medication review. Marland Kitchen Update 05/24/20: Pharmacist has attempted to speak with patient and has been unable to.  Patient at Urgent Care today with stomach pain.  RNCM will reschedule appointment with patient and speak with her regarding importance of speaking with Pharmacist. . Update 05/25/20: Patient declines to speak with Pharmacist at this time.  Patient Goals/Self-Care Activities Over the next 30 days, patient will:  -Attends all scheduled provider appointments Calls pharmacy for medication refills Calls provider office for new concerns or questions  Follow Up Plan: The Managed Medicaid care management team will reach out to the patient again over the next 30 days.  The patient has been provided with contact information for the Managed Medicaid care management team and has been advised to call with any health related questions or concerns.

## 2020-06-01 NOTE — Patient Outreach (Signed)
Medicaid Managed Care   Nurse Care Manager Note  06/01/2020 Name:  Michele Owens MRN:  [id number] DOB:  December 09, 1960  Michele Owens is an 60 y.o. year old female who is a primary patient of [name], Harvel Quale, MD.  The [facility] Managed Care Coordination team was consulted for assistance with:    chronic healthcare management needs.  Ms. Adami was given information about Medicaid Managed Care Coordination team services today. [name] agreed to services and verbal consent obtained.  Engaged with patient by telephone for follow up visit in response to provider referral for case management and/or care coordination services.   Assessments/Interventions:  Review of past medical history, allergies, medications, health status, including review of consultants reports, laboratory and other test data, was performed as part of comprehensive evaluation and provision of chronic care management services.  SDOH (Social Determinants of Health) assessments and interventions performed:   Care Plan  Allergies  Allergen Reactions  . Bee Venom Swelling and Other (See Comments)    "All over my body" (swelling)  . Fioricet [Butalbital-Apap-Caffeine] Nausea And Vomiting and Rash  . Ibuprofen Rash and Other (See Comments)    Severe rash  . Lamisil [Terbinafine] Rash and Other (See Comments)    Pt states this causes her to "feel funny"  . Nsaids Other (See Comments)    Per MD's orders     Medications Reviewed Today    Reviewed by Danie Chandler, RN (Registered Nurse) on 06/01/20 at 1320  Med List Status: <None>  Medication Order Taking? Sig Documenting Provider Last Dose Status Informant  albuterol (PROVENTIL HFA;VENTOLIN HFA) 108 (90 Base) MCG/ACT inhaler [id number] No Inhale 1-2 puffs into the lungs every 6 (six) hours as needed for wheezing or shortness of breath. Long, Arlyss Repress, MD unk Active Multiple Informants  allopurinol (ZYLOPRIM) 100 MG tablet [id number] No Take 100 mg by mouth daily.  [provider] 04/17/2020 Active Multiple Informants           Med Note Katrinka Blazing, [name]   Tue Oct 23, 2017  2:58 AM)    Ailene Ards ELLIPTA 62.5-25 MCG/INH AEPB [id number] No Inhale 1 puff into the lungs daily.  [provider] 04/18/2020 Unknown time Active Multiple Informants  benzonatate (TESSALON) 200 MG capsule [id number] No Take 1 capsule (200 mg total) by mouth in the morning, at noon, and at bedtime. 7 DS Hartley Barefoot A, MD 04/16/2020 Active Multiple Informants  Blood Glucose Monitoring Suppl (ACCU-CHEK AVIVA PLUS) w/Device KIT [id number] No 1 each by Other route daily.  [provider] unk unk Active Multiple Informants  calcitRIOL (ROCALTROL) 0.5 MCG capsule [id number] No Take 1 capsule (0.5 mcg total) by mouth daily. Elease Etienne, MD 04/17/2020 Active Multiple Informants           Med Note Katrinka Blazing, [name]   Tue Oct 23, 2017  2:59 AM)    carvedilol (COREG) 6.25 MG tablet [id number] No Take 1 tablet (6.25 mg total) by mouth 2 (two) times daily with a meal. Bensimhon, Bevelyn Buckles, MD 04/17/2020 1700 Active Multiple Informants  clonazePAM (KLONOPIN) 2 MG tablet [id number] No Take 1 tablet (2 mg total) by mouth 3 (three) times daily. for anxiety Jacalyn Lefevre, MD 04/18/2020 Unknown time Active Multiple Informants  cyclobenzaprine (FLEXERIL) 10 MG tablet [id number] No Take 10 mg by mouth 3 (three) times daily. [provider] 04/17/2020 Active Multiple Informants  diclofenac sodium (VOLTAREN) 1 % GEL [id number] No Apply 2 g topically 4 (four) times daily as needed (for  pain). [name], MD Past Week Unknown time Active Multiple Informants  EPINEPHrine 0.3 mg/0.3 mL IJ SOAJ injection [id number] No Inject 0.3 mg into the muscle daily as needed (allergic reaction). [provider] unk Active Multiple Informants  FEROSUL 325 (65 Fe) MG tablet [id number] No Take 325 mg by mouth daily. [provider] 04/17/2020 Active Multiple Informants           Med Note  [name], [name]   Tue Oct 23, 2017  2:57 AM)    glucose blood test strip [id number] No Use as instructed to check blood sugar up to 4 times daily  Patient taking differently: 1 each by Other route See admin instructions. Use as instructed to check blood sugar up to 4 times daily   [name], DO unk unk Active Multiple Informants  guaiFENesin (MUCINEX) 600 MG 12 hr tablet [id number] No Take 2 tablets (1,200 mg total) by mouth 2 (two) times daily. Regalado, Belkys A, MD unk Active Multiple Informants  ipratropium-albuterol (DUONEB) 0.5-2.5 (3) MG/3ML SOLN [id number] No Take 3 mLs by nebulization 4 (four) times daily.  [provider] unk Active Multiple Informants           Med Note [name], [name]   Tue Oct 23, 2017  2:57 AM)    levothyroxine (SYNTHROID, LEVOTHROID) 112 MCG tablet [id number] No Take 224 mcg by mouth daily. [provider] 04/18/2020 Unknown time Active Multiple Informants           Med Note [name], [name]   Tue Oct 23, 2017  2:57 AM)    lisinopril (PRINIVIL,ZESTRIL) 5 MG tablet [id number] No Take 1 tablet (5 mg total) by mouth daily. Please call for office visit [phone] [name], MD 04/18/2020 Unknown time Active Multiple Informants           Med Note [name], [name]   Tue Oct 23, 2017  2:58 AM)    loperamide (IMODIUM) 2 MG capsule [id number] No Take 1 capsule (2 mg total) by mouth 4 (four) times daily as needed for diarrhea or loose stools. [name], MD unk Active Multiple Informants  metFORMIN (GLUCOPHAGE) 500 MG tablet [id number] No Take 1 tablet (500 mg total) by mouth 2 (two) times daily with a meal. [name], MD 04/17/2020 Active Multiple Informants           Med Note [name], [name]   Tue Oct 23, 2017  2:58 AM)    nitroGLYCERIN (NITROSTAT) 0.4 MG SL tablet [id number] No Place 1 tablet (0.4 mg total) under the tongue every 5 (five) minutes as needed for chest pain. [name], MD unk Active Multiple Informants  nystatin  cream (MYCOSTATIN) [id number] No Apply 1 application topically 2 (two) times daily as needed for dry skin. Regalado, Belkys A, MD 04/16/2020 Active Multiple Informants  omeprazole (PRILOSEC) 20 MG capsule [id number] No Take 20 mg by mouth 2 (two) times daily before a meal. [provider] 04/17/2020 Active Multiple Informants  oxyCODONE-acetaminophen (PERCOCET) 10-325 MG tablet [id number] No Take 1 tablet by mouth every 6 (six) hours as needed for pain. [provider] 04/17/2020 Active Multiple Informants           Med Note Marcellina Millin Apr 12, 2020  1:22 PM) LF on 03-25-20 # 120 DS 30  OXYGEN [id number] No Inhale 2-3 L into the lungs continuous. [provider] 01/14/2020 Unknown time Active Multiple Informants  Med Note Corky Mull   Mon Apr 12, 2020 10:08 AM)    potassium chloride SA (K-DUR,KLOR-CON) 20 MEQ tablet [id number] No Take 1 tablet (20 mEq total) by mouth 2 (two) times daily. [name], MD 04/17/2020 Active Multiple Informants  QC STOOL SOFTENER PLS LAXATIVE 8.6-50 MG tablet [id number] No Take 1 tablet by mouth at bedtime. [provider] 04/17/2020 Active Multiple Informants           Med Note [name], [name]   Tue Oct 23, 2017  2:56 AM)    rosuvastatin (CRESTOR) 10 MG tablet [id number] No Take 10 mg by mouth every evening. [provider] 04/16/2020 Active Multiple Informants  torsemide (DEMADEX) 20 MG tablet [id number]  TAKE 4 (FOUR) TABLETS BY MOUTH DAILY (2AM+ 2NOON) [name], MD  Active   traZODone (DESYREL) 50 MG tablet [id number] No Take 100 mg by mouth at bedtime. [provider] 04/17/2020 Active Multiple Informants          Patient Active Problem List   Diagnosis Date Noted  . Acute on chronic respiratory failure with hypoxia and hypercapnia ([place]) 04/18/2020  . Acute on chronic respiratory failure with hypoxia ([place]) 04/18/2020  . Hemoptysis 04/18/2020  . Acute metabolic encephalopathy  59/74/1638  . Acute encephalopathy 04/18/2020  . Chronic diastolic CHF (congestive heart failure) ([place]) 04/18/2020  . Sepsis ([place]) 04/18/2020  . Morbid obesity with BMI of 50.0-59.9, adult ([place])   . Fall at home, initial encounter 06/23/2018  . HNP (herniated nucleus pulposus), thoracic 06/23/2018  . CHF (congestive heart failure) ([place]) 06/18/2018  . Hypertensive disorder 08/28/2017  . Obesity 08/28/2017  . COPD exacerbation ([place]) 08/04/2017  . Acute and chronic respiratory failure with hypercapnia ([place])   . Pulmonary HTN ([place])   . Genetic testing 02/27/2017  . Family history of thyroid cancer   . Hypomagnesemia 10/03/2016  . Atypical chest pain   . HLD (hyperlipidemia) 09/23/2016  . Gout 09/23/2016  . DVT (deep vein thrombosis) in pregnancy 09/23/2016  . Thyroid cancer ([place]) 09/23/2016  . Hypocalcemia 09/23/2016  . AKI (acute kidney injury) ([place]) 09/23/2016  . Acute pulmonary edema (H[facility])   . Acute hypoxemic respiratory failure ([place])   . Ventilator dependent ([place])   . Chronic respiratory failure with hypoxia and hypercapnia ([place]) 07/16/2016  . CAP (community acquired pneumonia) 01/23/2016  . 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 Pulmonary hypertension ([place]) 01/23/2016  . Obesity hypoventilation syndrome ([place]) 08/26/2015  . Dyslipidemia associated with type 2 diabetes mellitus ([place]) 04/24/2015  . Leukocytosis 04/24/2015  . Controlled type 2 diabetes mellitus without complication, without long-term current use of insulin ([place]) 04/24/2015  . Anxiety 04/24/2015  . Benign essential HTN 04/24/2015  . Normocytic anemia 04/24/2015  . OSA (obstructive sleep apnea) 05/10/2012  . Tobacco abuse 07/04/2011    Conditions to be addressed/monitored per PCP order:  chronic healthcare management needs,OSA, DM2, obesity, CHF  Care Plan : General Plan of Care (Adult)  Updates made by [name], RN since 06/01/2020 12:00 AM    Problem: Barriers to Treatment      Long-Range Goal: Barriers to Treatment Identified and Managed   Start Date: 05/03/2020  Expected End Date: 08/01/2020  Recent Progress: Not on track  Priority: High  Note:   [place] (see longitudinal plan of care for additional care plan information)  Current Barriers:  . Transportation barriers-patient utilizing [employer] transportation as needed. . Limited access to caregiver-patient needs  PCS, currently not receiving services she is expecting.  Patient states she is now receiving PCS.  Nurse Clinical Goal(s):  Marland Kitchen Over the next 30 days, patient will work with [employer] to address needs related to transportation. Marland Kitchen Update 05/12/20:  Patient has used [employer] and is very pleased. . Over the next 90 days, patient will attend all scheduled medical appointments. Marland Kitchen RNCM will make referral to SW for assistance with PCS . Update 05/20/20:  Patient states she is now receiving PCS from [facility] 3 days a week for 4 hours each day. Marland Kitchen Update 05/27/20: BSW spoke with patient about PCS, she stated someone usually comes in 3 days a week for a few hours. She stated her aide was off today. BSW contacted [facility] and spoke with [facility], she stated that patient has 13 hours per week with 6 days a week, however patient chooses to have an aide come out 3 days a week so that she can have more hours. The aide assist with most ADL's.  Interventions: . Inter-disciplinary care team collaboration (see longitudinal plan of care) . Referred patient to community resources care guide team for assistance with transportation. . Provided patient with information about [employer]. . Discussed plans with patient for ongoing care management follow up and provided patient with direct contact information for care management team . Advised patient to call [facility] Transportation. Marland Kitchen Collaborated with [facility] regarding  transportation. . Assisted patient/caregiver with obtaining information about health plan benefits . Collaborated with Social Work regarding [employer].  Patient Self Care Activities:  . Patient will call [employer].   Care Plan : General Plan of Care (Adult)  Updates made by Danie Chandler, RN since 06/01/2020 12:00 AM    Problem: Health Promotion or Disease Self-Management (General Plan of Care)   Priority: High  Onset Date: 04/23/2020    Long-Range Goal: Self-Management Plan Developed   Start Date: 04/23/2020  Expected End Date: 07/22/2020  Recent Progress: Not on track  Priority: Medium  Note:   Current Barriers:  . Chronic Disease Management support and education needs.  Patient with abdominal wall infection-seen at [facility] ED 05/24/20 - I&D performed. Marland Kitchen Update 06/01/20:  Patient states she is improving-HHRN is cleaning and dressing infection.  Nurse Case Manager Clinical Goal(s):  Marland Kitchen Over the next 30 days, patient will attend all scheduled medical appointments:  . Over the next 30 days, patient will work with CM team pharmacist to review medications. Marland Kitchen Update 05/20/20:  Patient has appointment with Pharmacist 05/21/20. Marland Kitchen Update 05/25/20:  Patient declines to speak with Pharmacist at this time.  Interventions:  . Inter-disciplinary care team collaboration (see longitudinal plan of care) . Evaluation of current treatment plan and patient's adherence to plan as established by provider. . Reviewed medications with patient. Steele Sizer with pharmacist regarding medications. . Discussed plans with patient for ongoing care management follow up and provided patient with direct contact information for care management team . Reviewed scheduled/upcoming provider appointments.  Marland Kitchen Update 05/20/20:  Patient unable to attend PT appointment 05/19/20, has next appointment 05/26/20.  Has Cardiology appointment 05/28/20.  Reviewed [facility] Transportation information with patient again and with  aide. Marland Kitchen Pharmacy referral for medication review. Marland Kitchen Update 05/24/20: Pharmacist has attempted to speak with patient and has been unable to.  Patient at Urgent Care today with stomach pain.  RNCM will reschedule appointment with patient and speak with her regarding importance of speaking with Pharmacist. . Update 05/25/20:  Patient declines to speak with Pharmacist at this time.  Patient Goals/Self-Care Activities Over the next 30 days, patient will:  -Attends all scheduled provider appointments Calls pharmacy for medication refills Calls provider office for new concerns or questions  Follow Up Plan: The Managed Medicaid care management team will reach out to the patient again over the next 30 days.  The patient has been provided with contact information for the Managed Medicaid care management team and has been advised to call with any health related questions or concerns.     Follow Up:  Patient agrees to Care Plan and Follow-up.  Plan: The Managed Medicaid care management team will reach out to the patient again over the next 7 days. and The patient has been provided with contact information for the Managed Medicaid care management team and has been advised to call with any health related questions or concerns.  Date/time of next scheduled RN care management/care coordination outreach:  06/07/20. At 0900

## 2020-06-02 ENCOUNTER — Ambulatory Visit: Payer: Medicaid Other

## 2020-06-04 ENCOUNTER — Other Ambulatory Visit: Payer: Self-pay

## 2020-06-04 NOTE — Patient Outreach (Signed)
Medicaid Managed Care Social Work Note  06/04/2020 Name:  Michele Owens MRN:  [id number] DOB:  April 23, 1960  Michele Owens is an 60 y.o. year old female who is a primary patient of [name], [name], MD.  The Medicaid Managed Care Coordination team was consulted for assistance with:  Community Resources   Michele Owens was given information about Medicaid Managed CareCoordination services today. Michele Owens agreed to services and verbal consent obtained.  Engaged with patient  for by telephone forfollow up visit in response to referral for case management and/or care coordination services.   Assessments/Interventions:  Review of past medical history, allergies, medications, health status, including review of consultants reports, laboratory and other test data, was performed as part of comprehensive evaluation and provision of chronic care management services.  SDOH: (Social Determinant of Health) assessments and interventions performed:   Patient stated she is ready to move to [facility]. She has already started the process but wants to save some money first and she has to wait on her nephew who will drive the uhaul truck.  Advanced Directives Status:  Not addressed in this encounter.  Care Plan                 Allergies  Allergen Reactions  . Bee Venom Swelling and Other (See Comments)    "All over my body" (swelling)  . Fioricet [Butalbital-Apap-Caffeine] Nausea And Vomiting and Rash  . Ibuprofen Rash and Other (See Comments)    Severe rash  . Lamisil [Terbinafine] Rash and Other (See Comments)    Pt states this causes her to "feel funny"  . Nsaids Other (See Comments)    Per MD's orders     Medications Reviewed Today    Reviewed by [name], RN (Registered Nurse) on 06/01/20 at 55  Med List Status: <None>  Medication Order Taking? Sig Documenting Provider Last Dose Status Informant  albuterol (PROVENTIL HFA;VENTOLIN HFA) 108 (90 Base) MCG/ACT inhaler [id number] No  Inhale 1-2 puffs into the lungs every 6 (six) hours as needed for wheezing or shortness of breath. Long, [name], MD unk Active Multiple Informants  allopurinol (ZYLOPRIM) 100 MG tablet [id number] No Take 100 mg by mouth daily. [provider] 04/17/2020 Active Multiple Informants           Med Note [name], [name]   Tue Oct 23, 2017  2:58 AM)    [name] ELLIPTA 62.5-25 MCG/INH AEPB [id number] No Inhale 1 puff into the lungs daily.  [provider] 04/18/2020 Unknown time Active Multiple Informants  benzonatate (TESSALON) 200 MG capsule [id number] No Take 1 capsule (200 mg total) by mouth in the morning, at Owens, and at bedtime. 7 DS Niel Hummer A, MD 04/16/2020 Active Multiple Informants  Blood Glucose Monitoring Suppl (ACCU-CHEK AVIVA PLUS) w/Device KIT [id number] No 1 each by Other route daily.  [provider] unk unk Active Multiple Informants  calcitRIOL (ROCALTROL) 0.5 MCG capsule [id number] No Take 1 capsule (0.5 mcg total) by mouth daily. Modena Jansky, MD 04/17/2020 Active Multiple Informants           Med Note [name], [name]   Tue Oct 23, 2017  2:59 AM)    carvedilol (COREG) 6.25 MG tablet [id number] No Take 1 tablet (6.25 mg total) by mouth 2 (two) times daily with a meal. Bensimhon, [name], MD 04/17/2020 1700 Active Multiple Informants  clonazePAM (KLONOPIN) 2 MG tablet [id number] No Take 1 tablet (2 mg total) by mouth 3 (three) times daily.  for anxiety [name], MD 04/18/2020 Unknown time Active Multiple Informants  cyclobenzaprine (FLEXERIL) 10 MG tablet [id number] No Take 10 mg by mouth 3 (three) times daily. [provider] 04/17/2020 Active Multiple Informants  diclofenac sodium (VOLTAREN) 1 % GEL [id number] No Apply 2 g topically 4 (four) times daily as needed (for pain). [name], MD Past Week Unknown time Active Multiple Informants  EPINEPHrine 0.3 mg/0.3 mL IJ SOAJ injection [id number] No Inject 0.3 mg into the muscle daily as needed  (allergic reaction). [provider] unk Active Multiple Informants  FEROSUL 325 (65 Fe) MG tablet [id number] No Take 325 mg by mouth daily. [provider] 04/17/2020 Active Multiple Informants           Med Note [name], [name]   Tue Oct 23, 2017  2:57 AM)    glucose blood test strip [id number] No Use as instructed to check blood sugar up to 4 times daily  Patient taking differently: 1 each by Other route See admin instructions. Use as instructed to check blood sugar up to 4 times daily   [name], DO unk unk Active Multiple Informants  guaiFENesin (MUCINEX) 600 MG 12 hr tablet [id number] No Take 2 tablets (1,200 mg total) by mouth 2 (two) times daily. Regalado, Belkys A, MD unk Active Multiple Informants  ipratropium-albuterol (DUONEB) 0.5-2.5 (3) MG/3ML SOLN [id number] No Take 3 mLs by nebulization 4 (four) times daily.  [provider] unk Active Multiple Informants           Med Note [name], [name]   Tue Oct 23, 2017  2:57 AM)    levothyroxine (SYNTHROID, LEVOTHROID) 112 MCG tablet [id number] No Take 224 mcg by mouth daily. [provider] 04/18/2020 Unknown time Active Multiple Informants           Med Note [name], [name]   Tue Oct 23, 2017  2:57 AM)    lisinopril (PRINIVIL,ZESTRIL) 5 MG tablet [id number] No Take 1 tablet (5 mg total) by mouth daily. Please call for office visit [phone] [name], MD 04/18/2020 Unknown time Active Multiple Informants           Med Note [name], [name]   Tue Oct 23, 2017  2:58 AM)    loperamide (IMODIUM) 2 MG capsule [id number] No Take 1 capsule (2 mg total) by mouth 4 (four) times daily as needed for diarrhea or loose stools. [name], MD unk Active Multiple Informants  metFORMIN (GLUCOPHAGE) 500 MG tablet [id number] No Take 1 tablet (500 mg total) by mouth 2 (two) times daily with a meal. [name], MD 04/17/2020 Active Multiple Informants           Med Note [name], [name]   Tue Oct 23, 2017   2:58 AM)    nitroGLYCERIN (NITROSTAT) 0.4 MG SL tablet [id number] No Place 1 tablet (0.4 mg total) under the tongue every 5 (five) minutes as needed for chest pain. [name], MD unk Active Multiple Informants  nystatin cream (MYCOSTATIN) [id number] No Apply 1 application topically 2 (two) times daily as needed for dry skin. Regalado, Belkys A, MD 04/16/2020 Active Multiple Informants  omeprazole (PRILOSEC) 20 MG capsule [id number] No Take 20 mg by mouth 2 (two) times daily before a meal. [provider] 04/17/2020 Active Multiple Informants  oxyCODONE-acetaminophen (PERCOCET) 10-325 MG tablet [id number] No Take 1 tablet by mouth every 6 (six) hours as needed for pain. [provider] 04/17/2020 Active Multiple Informants  Med Note Corky Mull   Mon Apr 12, 2020  1:22 PM) LF on 03-25-20 # 120 DS 30  OXYGEN [id number] No Inhale 2-3 L into the lungs continuous. [provider] 01/14/2020 Unknown time Active Multiple Informants           Med Note Corky Mull   Mon Apr 12, 2020 10:08 AM)    potassium chloride SA (K-DUR,KLOR-CON) 20 MEQ tablet [id number] No Take 1 tablet (20 mEq total) by mouth 2 (two) times daily. [name], MD 04/17/2020 Active Multiple Informants  QC STOOL SOFTENER PLS LAXATIVE 8.6-50 MG tablet [id number] No Take 1 tablet by mouth at bedtime. [provider] 04/17/2020 Active Multiple Informants           Med Note [name], [name]   Tue Oct 23, 2017  2:56 AM)    rosuvastatin (CRESTOR) 10 MG tablet [id number] No Take 10 mg by mouth every evening. [provider] 04/16/2020 Active Multiple Informants  torsemide (DEMADEX) 20 MG tablet [id number]  TAKE 4 (FOUR) TABLETS BY MOUTH DAILY (2AM+ 2NOON) [name], MD  Active   traZODone (DESYREL) 50 MG tablet [id number] No Take 100 mg by mouth at bedtime. [provider] 04/17/2020 Active Multiple Informants          Patient Active Problem List   Diagnosis Date  Noted  . Acute on chronic respiratory failure with hypoxia and hypercapnia ([place]) 04/18/2020  . Acute on chronic respiratory failure with hypoxia ([place]) 04/18/2020  . Hemoptysis 04/18/2020  . Acute metabolic encephalopathy 60/07/5995  . Acute encephalopathy 04/18/2020  . Chronic diastolic CHF (congestive heart failure) ([place]) 04/18/2020  . Sepsis ([place]) 04/18/2020  . Morbid obesity with BMI of 50.0-59.9, adult ([place])   . Fall at home, initial encounter 06/23/2018  . HNP (herniated nucleus pulposus), thoracic 06/23/2018  . CHF (congestive heart failure) ([place]) 06/18/2018  . Hypertensive disorder 08/28/2017  . Obesity 08/28/2017  . COPD exacerbation ([place]) 08/04/2017  . Acute and chronic respiratory failure with hypercapnia ([place])   . Pulmonary HTN ([place])   . Genetic testing 02/27/2017  . Family history of thyroid cancer   . Hypomagnesemia 10/03/2016  . Atypical chest pain   . HLD (hyperlipidemia) 09/23/2016  . Gout 09/23/2016  . DVT (deep vein thrombosis) in pregnancy 09/23/2016  . Thyroid cancer ([place]) 09/23/2016  . Hypocalcemia 09/23/2016  . AKI (acute kidney injury) ([place]) 09/23/2016  . Acute pulmonary edema (H[facility])   . Acute hypoxemic respiratory failure ([place])   . Ventilator dependent ([place])   . Chronic respiratory failure with hypoxia and hypercapnia ([place]) 07/16/2016  . CAP (community acquired pneumonia) 01/23/2016  . 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 Pulmonary hypertension ([employer]) 01/23/2016  . Obesity hypoventilation syndrome ([place]) 08/26/2015  . Dyslipidemia associated with type 2 diabetes mellitus ([place]) 04/24/2015  . Leukocytosis 04/24/2015  . Controlled type 2 diabetes mellitus without complication, without long-term current use of insulin ([place]) 04/24/2015  . Anxiety 04/24/2015  . Benign essential HTN 04/24/2015  . Normocytic anemia 04/24/2015  . OSA (obstructive sleep apnea) 05/10/2012  . Tobacco abuse 07/04/2011    Conditions to be  addressed/monitored per PCP order:  housing  Care Plan : General Plan of Care (Adult)  Updates made by Ethelda Chick since 06/04/2020 12:00 AM    Problem: Barriers to Treatment     Long-Range Goal: Barriers to Treatment Identified and Managed   Start Date: 05/03/2020  Expected End Date: 08/01/2020  Recent Progress:  Not on track  Priority: High  Note:   CARE PLAN ENTRY Medicaid Managed Care (see longitudinal plan of care for additional care plan information)  Current Barriers:  . Transportation barriers-patient utilizing [employer] transportation as needed. . Limited access to caregiver-patient needs PCS, currently not receiving services she is expecting.  Patient states she is now receiving PCS.  Nurse Clinical Goal(s):  Marland Kitchen Over the next 30 days, patient will work with [employer] to address needs related to transportation. Marland Kitchen Update 05/12/20:  Patient has used [employer] and is very pleased. . Over the next 90 days, patient will attend all scheduled medical appointments. Marland Kitchen RNCM will make referral to SW for assistance with PCS . Update 05/20/20:  Patient states she is now receiving PCS from [facility] 3 days a week for 4 hours each day. Marland Kitchen Update 05/27/20: BSW spoke with patient about PCS, she stated someone usually comes in 3 days a week for a few hours. She stated her aide was off today. BSW contacted [place] and spoke with [facility], she stated that patient has 13 hours per week with 6 days a week, however patient chooses to have an aide come out 3 days a week so that she can have more hours. The aide assist with most ADL's. Marland Kitchen Update 06/04/20: Patient stated she is ready to move to [facility]. She has already started the process but wants to save some money first and she has to wait on her nephew who will drive the uhaul truck.  Interventions: . Inter-disciplinary care team collaboration (see longitudinal plan of care) . Referred  patient to community resources care guide team for assistance with transportation. . Provided patient with information about [employer]. . Discussed plans with patient for ongoing care management follow up and provided patient with direct contact information for care management team . Advised patient to call [place]. Marland Kitchen Collaborated with [facility] regarding transportation. . Assisted patient/caregiver with obtaining information about health plan benefits . Collaborated with Social Work regarding [employer].  Patient Self Care Activities:  . Patient will call [employer].        Follow up:  Patient agrees to Care Plan and Follow-up.  Plan: The Managed Medicaid care management team will reach out to the patient again over the next 14 days.  Date/time of next scheduled Social Work care management/care coordination outreach:  06/24/20  Mickel Fuchs, [place], [place]  High Risk Managed Medicaid Team

## 2020-06-04 NOTE — Patient Instructions (Signed)
Visit Information  Ms. Michele Owens was given information about Medicaid Managed Care team care coordination services as a part of their [facility] Medicaid benefit. Michele Owens verbally consented to engagement with the [facility] Managed Care team.   For questions related to your [facility] health plan, please call: [phone]  If you would like to schedule transportation through your [facility] plan, please call the following number at least 2 days in advance of your appointment: [phone]  Ms. Michele Owens - following are the goals we discussed in your visit today:  Goals Addressed   None       Social Worker will follow up in 14 days.   Michele Owens  Following is a copy of your plan of care:

## 2020-06-06 IMAGING — DX DG CHEST 2V
3 series · 3 of 3 positions shown · non-contrast
Comparison: Radiographs September 11, 2017.

CLINICAL DATA: Shortness of breath.

EXAM:
CHEST - 2 VIEW

[chest pa (1 of 2)]
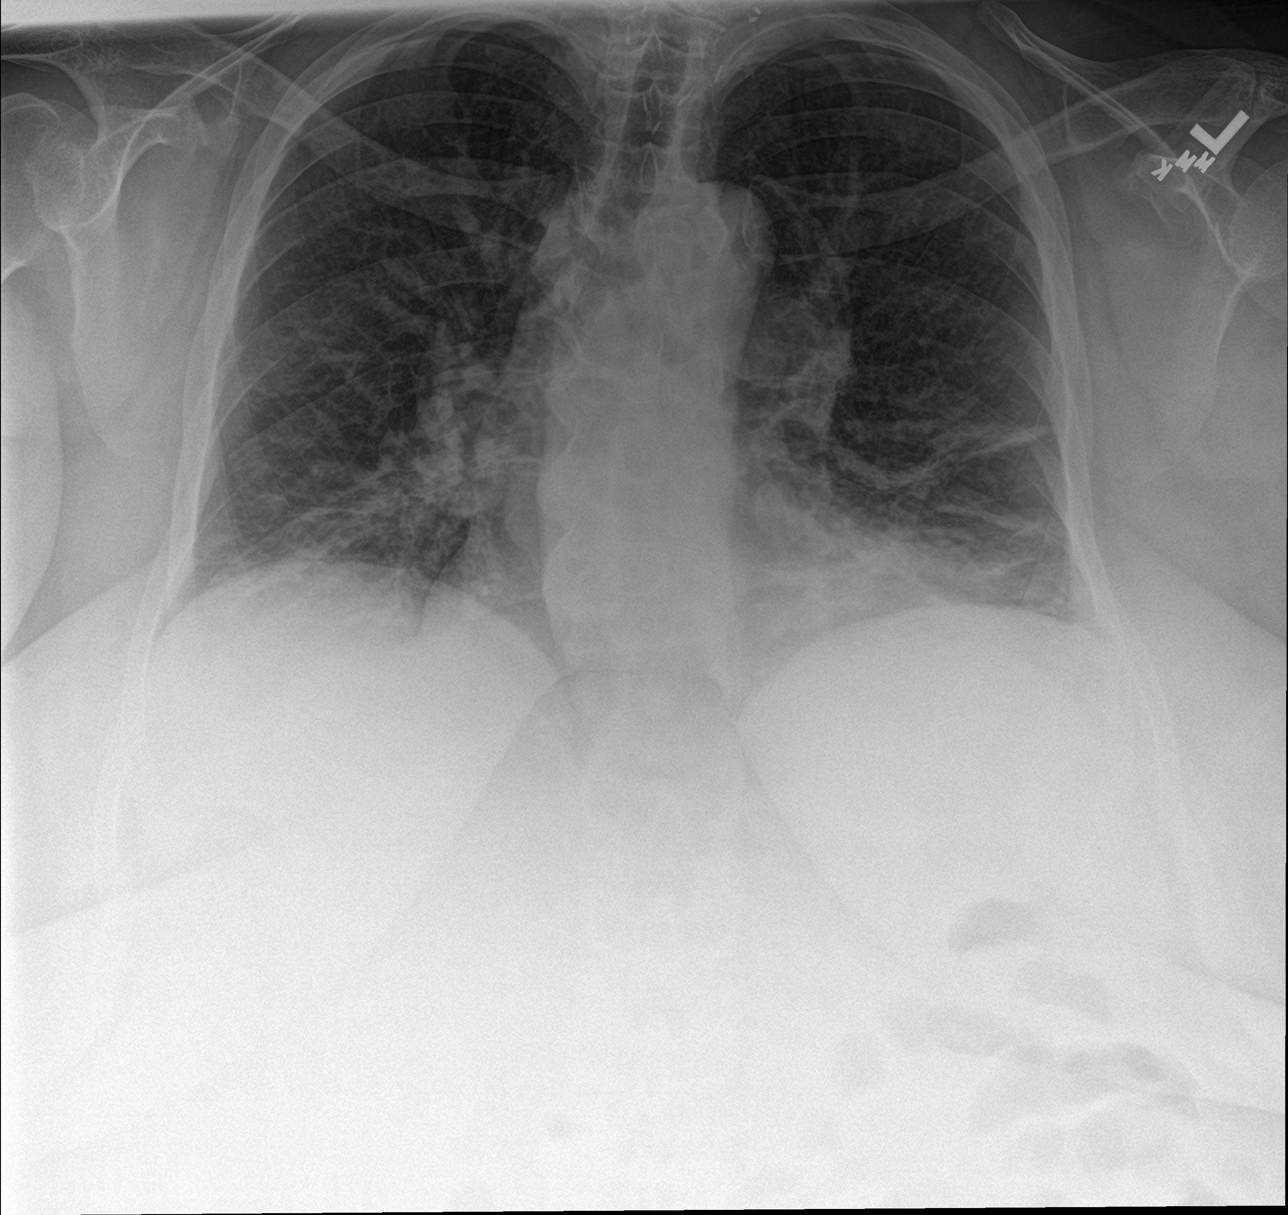

[chest lat]
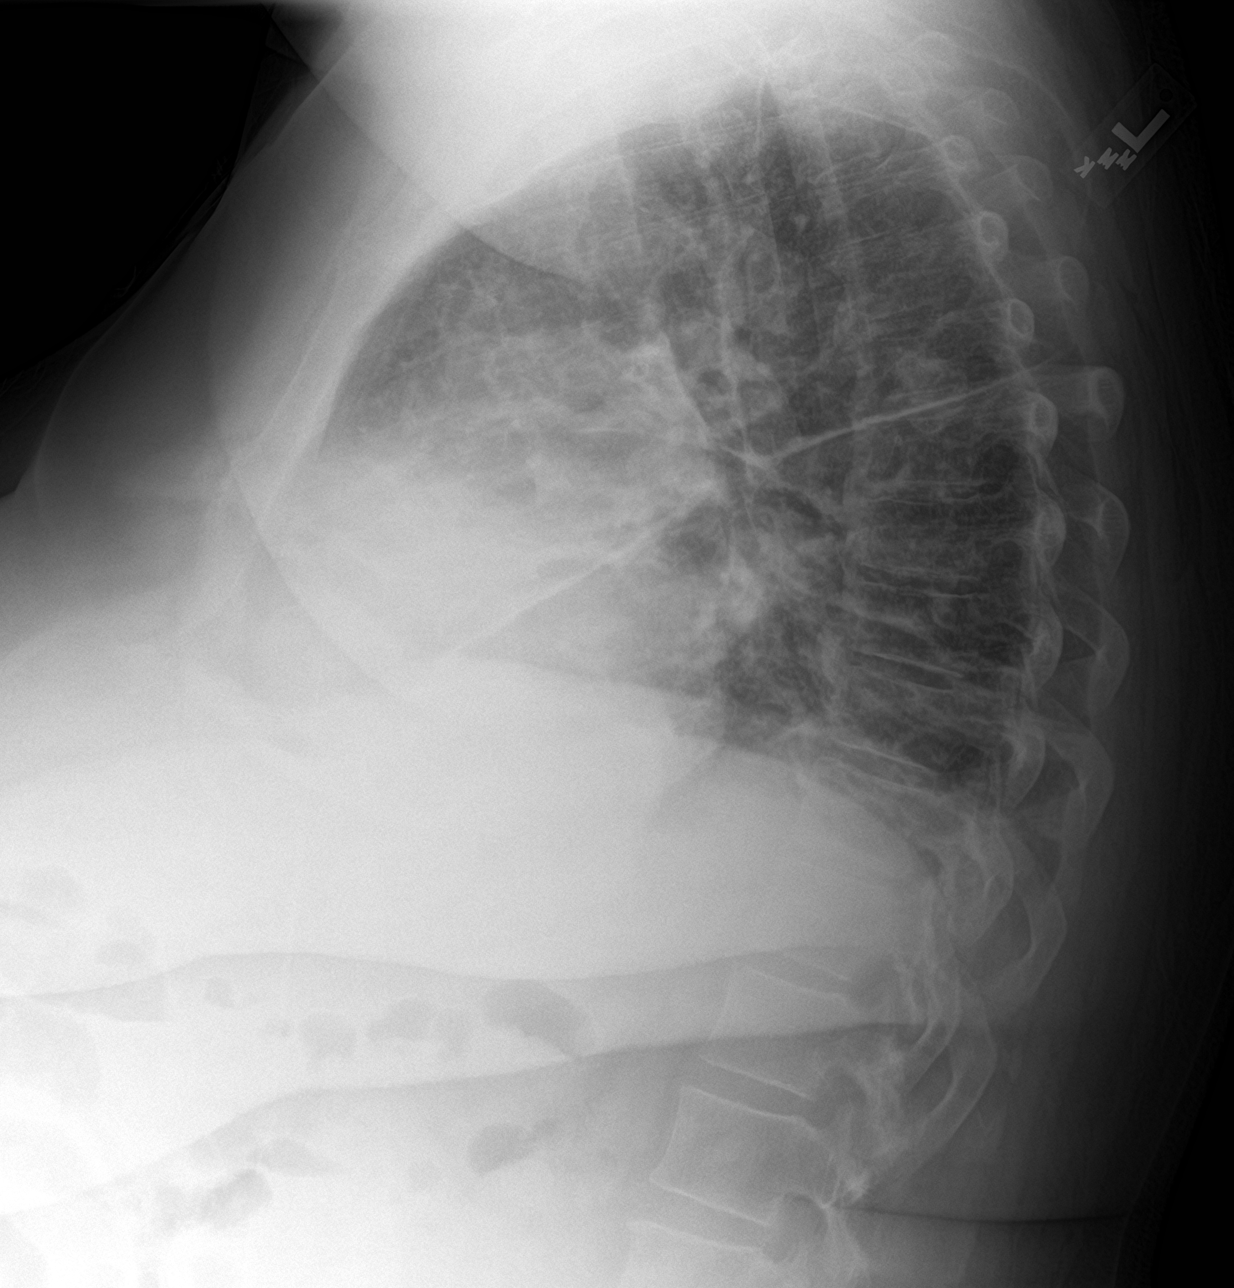

[chest pa (2 of 2)]
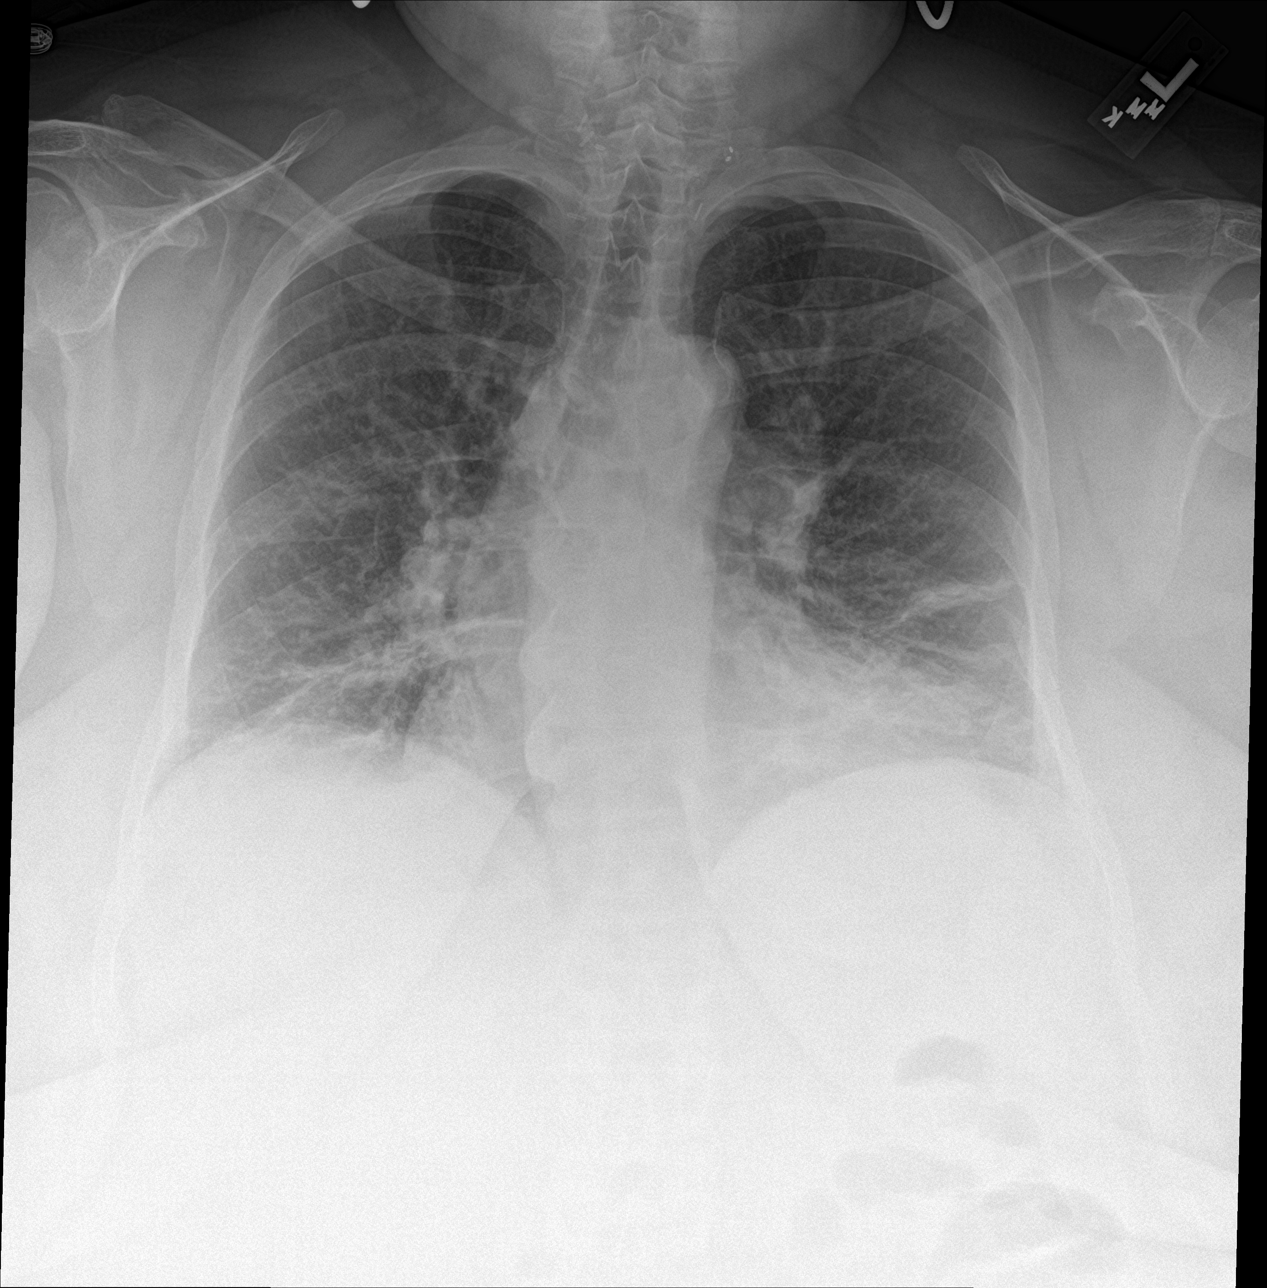

[3 of 3 positions shown; findings below may reference images not displayed]

FINDINGS: Mild central pulmonary vascular congestion is noted. No pneumothorax
or pleural effusion is noted. Mild bibasilar subsegmental
atelectasis or scarring is noted. No significant pleural effusion is
noted. Bony thorax is unremarkable.
IMPRESSION: Stable cardiomegaly with central pulmonary vascular congestion.
Stable bibasilar subsegmental atelectasis or scarring.

## 2020-06-07 ENCOUNTER — Other Ambulatory Visit: Payer: Self-pay | Admitting: Obstetrics and Gynecology

## 2020-06-07 ENCOUNTER — Other Ambulatory Visit: Payer: Self-pay

## 2020-06-07 NOTE — Patient Instructions (Signed)
Visit Information  Michele Owens was given information about Medicaid Managed Care team care coordination services as a part of their [facility] Medicaid benefit. Michele Owens verbally consented to engagement with the [facility] Managed Care team.   For questions related to your [facility] health plan, please call: [phone]  If you would like to schedule transportation through your [facility] plan, please call the following number at least 2 days in advance of your appointment: [phone]  Michele Owens - following are the goals we discussed in your visit today:  Goals Addressed            This Visit's Progress   . Protect My Health       Timeframe:  Long-Range Goal Priority:  Medium Start Date:    04/23/20                         Expected End Date:       07/22/20                Follow Up Date 06/14/20   - schedule appointment for vaccines needed due to my age or health - schedule recommended health tests (blood work, mammogram, colonoscopy, pap test) - schedule and keep appointment for annual check-up  Patient has PT appointment 05/19/20. Update 05/20/20:  Patient unable to attend PT appointment on 05/19/20.  Has next PT appointment 05/26/20.  Cardiology appointment 05/28/20. Update 05/24/20:  Attempted to reach patient today-she is at Urgent Care with stomach pain.  Will reschedule. Update 05/25/20:  Patient seen at Urgent Care and [place] 05/24/20.  Patient with abscess- given antibiotics. Update 06/01/20:  Patient continues to improve, HHRN cleaning and changing dressing. Update 06/07/20:  Patient states abscess continues to improve.  Has PT on Wednesday, 06/09/20.     Patient verbalizes understanding of instructions provided today.   The Managed Medicaid care management team will reach out to the patient again over the next 7 days.  The patient has been provided with contact information for the Managed Medicaid care management team and has been advised to call with any health  related questions or concerns.   Michele Raider RN, BSN [name] Management Coordinator - Managed Medicaid High Risk [phone]  Following is a copy of your plan of care:  Patient Care Plan: General Plan of Care (Adult)    Problem Identified: Health Promotion or Disease Owens-Management (General Plan of Care)   Priority: Medium  Onset Date: 04/23/2020    Problem Identified: Barriers to Treatment     Long-Range Goal: Barriers to Treatment Identified and Managed   Start Date: 05/03/2020  Expected End Date: 08/01/2020  Recent Progress: Not on track  Priority: High  Note:   CARE PLAN ENTRY Medicaid Managed Care (see longitudinal plan of care for additional care plan information)  Current Barriers:  . Transportation barriers-patient utilizing [employer] transportation as needed. . Limited access to caregiver-patient needs PCS, currently not receiving services she is expecting.  Patient states she is now receiving PCS.  Nurse Clinical Goal(s):  Marland Kitchen Over the next 30 days, patient will work with [employer] to address needs related to transportation. Marland Kitchen Update 05/12/20:  Patient has used [employer] and is very pleased. . Over the next 90 days, patient will attend all scheduled medical appointments. Marland Kitchen RNCM will make referral to SW for assistance with PCS . Update 05/20/20:  Patient states she is now receiving PCS from [facility] 3 days a week for 4 hours each day. Marland Kitchen Update 05/27/20: BSW spoke with patient about PCS, she stated someone usually comes in 3 days a week for a few hours. She stated her aide was off today. BSW contacted [place] and spoke with [facility], she stated that patient has 13 hours per week with 6 days a week, however patient chooses to have an aide come out 3 days a week so that she can have more hours. The aide assist with most ADL's. Marland Kitchen Update 06/04/20: Patient stated she is ready to move to  [facility]. She has already started the process but wants to save some money first and she has to wait on her nephew who will drive the uhaul truck.  Interventions: . Inter-disciplinary care team collaboration (see longitudinal plan of care) . Referred patient to community resources care guide team for assistance with transportation. . Provided patient with information about [employer]. . Discussed plans with patient for ongoing care management follow up and provided patient with direct contact information for care management team . Advised patient to call [place]. Marland Kitchen Collaborated with [facility] regarding transportation. . Assisted patient/caregiver with obtaining information about health plan benefits . Collaborated with Social Work regarding [employer].  Patient Owens Care Activities:  . Patient will call [employer]. Marland Kitchen Update 06/07/20:  Patient continues to utilize [facility] with success.          Patient Care Plan: General Plan of Care (Adult)    Problem Identified: Health Promotion or Disease Owens-Management (General Plan of Care)   Priority: High  Onset Date: 04/23/2020    Long-Range Goal: Owens-Management Plan Developed   Start Date: 04/23/2020  Expected End Date: 07/22/2020  Recent Progress: Not on track  Priority: Medium  Note:   Current Barriers:  . Chronic Disease Management support and education needs.  Patient with abdominal wall infection-seen at [facility] ED 05/24/20 - I&D performed. Marland Kitchen Update 06/01/20:  Patient states she is improving-HHRN is cleaning and dressing infection. Marland Kitchen Update 06/07/20:  Infection continues to improve per patient, HHRN continues to clean and dress infection site.  Nurse Case Manager Clinical Goal(s):  Marland Kitchen Over the next 30 days, patient will attend all scheduled medical appointments:  . Over the next 30 days, patient will work with CM team pharmacist to review medications. Marland Kitchen Update 05/20/20:  Patient  has appointment with Pharmacist 05/21/20. Marland Kitchen Update 05/25/20:  Patient declines to speak with Pharmacist at this time.  Interventions:  . Inter-disciplinary care team collaboration (see longitudinal plan of care) . Evaluation of current treatment plan and patient's adherence to plan as established by provider. . Reviewed medications with patient. [name] with pharmacist regarding medications. . Discussed plans with patient for ongoing care management follow up and provided patient with direct contact information for care management team . Reviewed scheduled/upcoming provider appointments.  Marland Kitchen Update 05/20/20:  Patient unable to attend PT appointment 05/19/20, has next appointment 05/26/20.  Has Cardiology appointment 05/28/20.  Reviewed [place] information with patient again and with aide. Marland Kitchen Update 06/07/20:  Patient has PT appointment 06/09/20. Marland Kitchen Pharmacy referral for medication review. Marland Kitchen Update 05/24/20: Pharmacist has attempted to speak with patient and has been unable to.  Patient at Urgent Care today with stomach pain.  RNCM will reschedule appointment with patient and speak with her regarding importance of speaking with Pharmacist. . Update 05/25/20: Patient declines to speak with Pharmacist at this time.  Patient Goals/Owens-Care Activities Over  the next 30 days, patient will:  -Attends all scheduled provider appointments Calls pharmacy for medication refills Calls provider office for new concerns or questions  Follow Up Plan: The Managed Medicaid care management team will reach out to the patient again over the next 30 days.  The patient has been provided with contact information for the Managed Medicaid care management team and has been advised to call with any health related questions or concerns.

## 2020-06-07 NOTE — Patient Outreach (Signed)
Medicaid Managed Care   Nurse Care Manager Note  06/07/2020 Name:  [name] MRN:  [id number] DOB:  [birth year]  Michele Owens is an 60 y.o. year old female who is a primary patient of [name], [name], MD.  The [facility] Managed Care Coordination team was consulted for assistance with:    chronic healthcare management needs.  Michele Owens was given information about Medicaid Managed Care Coordination team services today. Michele Owens agreed to services and verbal consent obtained.  Engaged with patient by telephone for follow up visit in response to provider referral for case management and/or care coordination services.   Assessments/Interventions:  Review of past medical history, allergies, medications, health status, including review of consultants reports, laboratory and other test data, was performed as part of comprehensive evaluation and provision of chronic care management services.  SDOH (Social Determinants of Health) assessments and interventions performed:   Care Plan  Allergies  Allergen Reactions  . Bee Venom Swelling and Other (See Comments)    "All over my body" (swelling)  . Fioricet [Butalbital-Apap-Caffeine] Nausea And Vomiting and Rash  . Ibuprofen Rash and Other (See Comments)    Severe rash  . Lamisil [Terbinafine] Rash and Other (See Comments)    Pt states this causes her to "feel funny"  . Nsaids Other (See Comments)    Per MD's orders     Medications Reviewed Today    Reviewed by [name], RN (Registered Nurse) on 06/07/20 at [phone]  Med List Status: <None>  Medication Order Taking? Sig Documenting Provider Last Dose Status Informant  albuterol (PROVENTIL HFA;VENTOLIN HFA) 108 (90 Base) MCG/ACT inhaler [id number] No Inhale 1-2 puffs into the lungs every 6 (six) hours as needed for wheezing or shortness of breath. Long, [name], MD unk Active Multiple Informants  allopurinol (ZYLOPRIM) 100 MG tablet [id number] No Take 100 mg by mouth daily.  [provider] 04/17/2020 Active Multiple Informants           Med Note [name], [name]   Tue Oct 23, 2017  2:58 AM)    [name] ELLIPTA 62.5-25 MCG/INH AEPB [id number] No Inhale 1 puff into the lungs daily.  [provider] 04/18/2020 Unknown time Active Multiple Informants  benzonatate (TESSALON) 200 MG capsule [id number] No Take 1 capsule (200 mg total) by mouth in the morning, at Owens, and at bedtime. 7 DS Niel Hummer A, MD 04/16/2020 Active Multiple Informants  Blood Glucose Monitoring Suppl (ACCU-CHEK AVIVA PLUS) w/Device KIT [id number] No 1 each by Other route daily.  [provider] unk unk Active Multiple Informants  calcitRIOL (ROCALTROL) 0.5 MCG capsule [id number] No Take 1 capsule (0.5 mcg total) by mouth daily. Modena Jansky, MD 04/17/2020 Active Multiple Informants           Med Note [name], [name]   Tue Oct 23, 2017  2:59 AM)    carvedilol (COREG) 6.25 MG tablet [id number] No Take 1 tablet (6.25 mg total) by mouth 2 (two) times daily with a meal. Bensimhon, [name], MD 04/17/2020 1700 Active Multiple Informants  clonazePAM (KLONOPIN) 2 MG tablet [id number] No Take 1 tablet (2 mg total) by mouth 3 (three) times daily. for anxiety [name], MD 04/18/2020 Unknown time Active Multiple Informants  cyclobenzaprine (FLEXERIL) 10 MG tablet [id number] No Take 10 mg by mouth 3 (three) times daily. [provider] 04/17/2020 Active Multiple Informants  diclofenac sodium (VOLTAREN) 1 % GEL [id number] No Apply 2 g topically 4 (four) times daily as needed (for  pain). [name], MD Past Week Unknown time Active Multiple Informants  EPINEPHrine 0.3 mg/0.3 mL IJ SOAJ injection [id number] No Inject 0.3 mg into the muscle daily as needed (allergic reaction). [provider] unk Active Multiple Informants  FEROSUL 325 (65 Fe) MG tablet [id number] No Take 325 mg by mouth daily. [provider] 04/17/2020 Active Multiple Informants           Med Note  [name], [name]   Tue Oct 23, 2017  2:57 AM)    glucose blood test strip [id number] No Use as instructed to check blood sugar up to 4 times daily  Patient taking differently: 1 each by Other route See admin instructions. Use as instructed to check blood sugar up to 4 times daily   [name], DO unk unk Active Multiple Informants  guaiFENesin (MUCINEX) 600 MG 12 hr tablet [id number] No Take 2 tablets (1,200 mg total) by mouth 2 (two) times daily. Regalado, Belkys A, MD unk Active Multiple Informants  ipratropium-albuterol (DUONEB) 0.5-2.5 (3) MG/3ML SOLN [id number] No Take 3 mLs by nebulization 4 (four) times daily.  [provider] unk Active Multiple Informants           Med Note [name], [name]   Tue Oct 23, 2017  2:57 AM)    levothyroxine (SYNTHROID, LEVOTHROID) 112 MCG tablet [id number] No Take 224 mcg by mouth daily. [provider] 04/18/2020 Unknown time Active Multiple Informants           Med Note [name], [name]   Tue Oct 23, 2017  2:57 AM)    lisinopril (PRINIVIL,ZESTRIL) 5 MG tablet [id number] No Take 1 tablet (5 mg total) by mouth daily. Please call for office visit [phone] [name], MD 04/18/2020 Unknown time Active Multiple Informants           Med Note [name], [name]   Tue Oct 23, 2017  2:58 AM)    loperamide (IMODIUM) 2 MG capsule [id number] No Take 1 capsule (2 mg total) by mouth 4 (four) times daily as needed for diarrhea or loose stools. [name], MD unk Active Multiple Informants  metFORMIN (GLUCOPHAGE) 500 MG tablet [id number] No Take 1 tablet (500 mg total) by mouth 2 (two) times daily with a meal. [name], MD 04/17/2020 Active Multiple Informants           Med Note [name], [name]   Tue Oct 23, 2017  2:58 AM)    nitroGLYCERIN (NITROSTAT) 0.4 MG SL tablet [id number] No Place 1 tablet (0.4 mg total) under the tongue every 5 (five) minutes as needed for chest pain. [name], MD unk Active Multiple Informants  nystatin  cream (MYCOSTATIN) [id number] No Apply 1 application topically 2 (two) times daily as needed for dry skin. Regalado, Belkys A, MD 04/16/2020 Active Multiple Informants  omeprazole (PRILOSEC) 20 MG capsule [id number] No Take 20 mg by mouth 2 (two) times daily before a meal. [provider] 04/17/2020 Active Multiple Informants  oxyCODONE-acetaminophen (PERCOCET) 10-325 MG tablet [id number] No Take 1 tablet by mouth every 6 (six) hours as needed for pain. [provider] 04/17/2020 Active Multiple Informants           Med Note Marcellina Millin Apr 12, 2020  1:22 PM) LF on 03-25-20 # 120 DS 30  OXYGEN [id number] No Inhale 2-3 L into the lungs continuous. [provider] 01/14/2020 Unknown time Active Multiple Informants  Med Note Corky Mull   Mon Apr 12, 2020 10:08 AM)    potassium chloride SA (K-DUR,KLOR-CON) 20 MEQ tablet [id number] No Take 1 tablet (20 mEq total) by mouth 2 (two) times daily. [name], MD 04/17/2020 Active Multiple Informants  QC STOOL SOFTENER PLS LAXATIVE 8.6-50 MG tablet [id number] No Take 1 tablet by mouth at bedtime. [provider] 04/17/2020 Active Multiple Informants           Med Note [name], [name]   Tue Oct 23, 2017  2:56 AM)    rosuvastatin (CRESTOR) 10 MG tablet [id number] No Take 10 mg by mouth every evening. [provider] 04/16/2020 Active Multiple Informants  torsemide (DEMADEX) 20 MG tablet [id number]  TAKE 4 (FOUR) TABLETS BY MOUTH DAILY (2AM+ 2NOON) [name], MD  Active   traZODone (DESYREL) 50 MG tablet [id number] No Take 100 mg by mouth at bedtime. [provider] 04/17/2020 Active Multiple Informants          Patient Active Problem List   Diagnosis Date Noted  . Acute on chronic respiratory failure with hypoxia and hypercapnia ([place]) 04/18/2020  . Acute on chronic respiratory failure with hypoxia ([place]) 04/18/2020  . Hemoptysis 04/18/2020  . Acute metabolic encephalopathy  35/00/9381  . Acute encephalopathy 04/18/2020  . Chronic diastolic CHF (congestive heart failure) ([place]) 04/18/2020  . Sepsis ([place]) 04/18/2020  . Morbid obesity with BMI of 50.0-59.9, adult ([place])   . Fall at home, initial encounter 06/23/2018  . HNP (herniated nucleus pulposus), thoracic 06/23/2018  . CHF (congestive heart failure) ([place]) 06/18/2018  . Hypertensive disorder 08/28/2017  . Obesity 08/28/2017  . COPD exacerbation ([place]) 08/04/2017  . Acute and chronic respiratory failure with hypercapnia ([place])   . Pulmonary HTN ([place])   . Genetic testing 02/27/2017  . Family history of thyroid cancer   . Hypomagnesemia 10/03/2016  . Atypical chest pain   . HLD (hyperlipidemia) 09/23/2016  . Gout 09/23/2016  . DVT (deep vein thrombosis) in pregnancy 09/23/2016  . Thyroid cancer ([place]) 09/23/2016  . Hypocalcemia 09/23/2016  . AKI (acute kidney injury) ([place]) 09/23/2016  . Acute pulmonary edema (HCC)   . Acute hypoxemic respiratory failure ([place])   . Ventilator dependent ([place])   . Chronic respiratory failure with hypoxia and hypercapnia ([place]) 07/16/2016  . CAP (community acquired pneumonia) 01/23/2016  . 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 Pulmonary hypertension ([place]) 01/23/2016  . Obesity hypoventilation syndrome ([place]) 08/26/2015  . Dyslipidemia associated with type 2 diabetes mellitus ([place]) 04/24/2015  . Leukocytosis 04/24/2015  . Controlled type 2 diabetes mellitus without complication, without long-term current use of insulin ([place]) 04/24/2015  . Anxiety 04/24/2015  . Benign essential HTN 04/24/2015  . Normocytic anemia 04/24/2015  . OSA (obstructive sleep apnea) 05/10/2012  . Tobacco abuse 07/04/2011    Conditions to be addressed/monitored per PCP order: chronic healthcare management needs-OSA, DM2, COPD, CHF, obesity  Care Plan : General Plan of Care (Adult)  Updates made by [name], RN since 06/07/2020 12:00 AM    Problem: Barriers to  Treatment     Long-Range Goal: Barriers to Treatment Identified and Managed   Start Date: 05/03/2020  Expected End Date: 08/01/2020  Recent Progress: Not on track  Priority: High  Note:   [place] (see longitudinal plan of care for additional care plan information)  Current Barriers:  . Transportation barriers-patient utilizing [employer] transportation as needed. . Limited access to caregiver-patient needs  PCS, currently not receiving services she is expecting.  Patient states she is now receiving PCS.  Nurse Clinical Goal(s):  Marland Kitchen Over the next 30 days, patient will work with [employer] to address needs related to transportation. Marland Kitchen Update 05/12/20:  Patient has used [employer] and is very pleased. . Over the next 90 days, patient will attend all scheduled medical appointments. Marland Kitchen RNCM will make referral to SW for assistance with PCS . Update 05/20/20:  Patient states she is now receiving PCS from [facility] 3 days a week for 4 hours each day. Marland Kitchen Update 05/27/20: BSW spoke with patient about PCS, she stated someone usually comes in 3 days a week for a few hours. She stated her aide was off today. BSW contacted [place] and spoke with [facility], she stated that patient has 13 hours per week with 6 days a week, however patient chooses to have an aide come out 3 days a week so that she can have more hours. The aide assist with most ADL's. Marland Kitchen Update 06/04/20: Patient stated she is ready to move to [facility]. She has already started the process but wants to save some money first and she has to wait on her nephew who will drive the uhaul truck.  Interventions: . Inter-disciplinary care team collaboration (see longitudinal plan of care) . Referred patient to community resources care guide team for assistance with transportation. . Provided patient with information about [employer]. . Discussed plans with  patient for ongoing care management follow up and provided patient with direct contact information for care management team . Advised patient to call [place]. Marland Kitchen Collaborated with [facility] regarding transportation. . Assisted patient/caregiver with obtaining information about health plan benefits . Collaborated with Social Work regarding [employer].  Patient Self Care Activities:  . Patient will call [employer]. Marland Kitchen Update 06/07/20:  Patient continues to utilize [facility] with success.      Care Plan : General Plan of Care (Adult)  Updates made by [name], RN since 06/07/2020 12:00 AM    Problem: Health Promotion or Disease Self-Management (General Plan of Care)   Priority: High  Onset Date: 04/23/2020    Long-Range Goal: Self-Management Plan Developed   Start Date: 04/23/2020  Expected End Date: 07/22/2020  Recent Progress: Not on track  Priority: Medium  Note:   Current Barriers:  . Chronic Disease Management support and education needs.  Patient with abdominal wall infection-seen [facility] ED 05/24/20 - I&D performed. Marland Kitchen Update 06/01/20:  Patient states she is improving-HHRN is cleaning and dressing infection. Marland Kitchen Update 06/07/20:  Infection continues to improve per patient, HHRN continues to clean and dress infection site.  Nurse Case Manager Clinical Goal(s):  Marland Kitchen Over the next 30 days, patient will attend all scheduled medical appointments:  . Over the next 30 days, patient will work with CM team pharmacist to review medications. Marland Kitchen Update 05/20/20:  Patient has appointment with Pharmacist 05/21/20. Marland Kitchen Update 05/25/20:  Patient declines to speak with Pharmacist at this time.  Interventions:  . Inter-disciplinary care team collaboration (see longitudinal plan of care) . Evaluation of current treatment plan and patient's adherence to plan as established by provider. . Reviewed medications with patient. [name] with pharmacist regarding  medications. . Discussed plans with patient for ongoing care management follow up and provided patient with direct contact information for care management team . Reviewed scheduled/upcoming provider appointments.  Marland Kitchen Update 05/20/20:  Patient unable to  attend PT appointment 05/19/20, has next appointment 05/26/20.  Has Cardiology appointment 05/28/20.  Reviewed [place] information with patient again and with aide. Marland Kitchen Update 06/07/20:  Patient has PT appointment 06/09/20. Marland Kitchen Pharmacy referral for medication review. Marland Kitchen Update 05/24/20: Pharmacist has attempted to speak with patient and has been unable to.  Patient at Urgent Care today with stomach pain.  RNCM will reschedule appointment with patient and speak with her regarding importance of speaking with Pharmacist. . Update 05/25/20: Patient declines to speak with Pharmacist at this time.  Patient Goals/Self-Care Activities Over the next 30 days, patient will:  -Attends all scheduled provider appointments Calls pharmacy for medication refills Calls provider office for new concerns or questions  Follow Up Plan: The Managed Medicaid care management team will reach out to the patient again over the next 30 days.  The patient has been provided with contact information for the Managed Medicaid care management team and has been advised to call with any health related questions or concerns.     Follow Up:  Patient agrees to Care Plan and Follow-up.  Plan: The Managed Medicaid care management team will reach out to the patient again over the next 7 days. and The patient has been provided with contact information for the Managed Medicaid care management team and has been advised to call with any health related questions or concerns.  Date/time of next scheduled RN care management/care coordination outreach:  06/14/20 at 1130.

## 2020-06-08 ENCOUNTER — Other Ambulatory Visit: Payer: Self-pay

## 2020-06-08 ENCOUNTER — Other Ambulatory Visit: Payer: Medicaid Other | Admitting: Obstetrics and Gynecology

## 2020-06-08 DIAGNOSIS — Z419 Encounter for procedure for purposes other than remedying health state, unspecified: Secondary | ICD-10-CM | POA: Diagnosis not present

## 2020-06-08 NOTE — Patient Outreach (Signed)
Care Coordination  06/08/2020  Leshawn Houseworth 1960-04-30 [id number]   RNCM returned patient's phone call and answered questions.  Patient having a bad day, panic attack. Patient's cousin died today.  Reassurance and support given to patient.    [name] RN, BSN [place]  [facility] - Managed Medicaid High Risk [phone].

## 2020-06-09 ENCOUNTER — Ambulatory Visit: Payer: Medicaid Other | Attending: Podiatry | Admitting: Physical Therapy

## 2020-06-09 ENCOUNTER — Other Ambulatory Visit: Payer: Medicaid Other | Admitting: Obstetrics and Gynecology

## 2020-06-09 ENCOUNTER — Other Ambulatory Visit: Payer: Self-pay

## 2020-06-09 ENCOUNTER — Telehealth: Payer: Self-pay | Admitting: Physical Therapy

## 2020-06-09 DIAGNOSIS — M79672 Pain in left foot: Secondary | ICD-10-CM | POA: Insufficient documentation

## 2020-06-09 DIAGNOSIS — R262 Difficulty in walking, not elsewhere classified: Secondary | ICD-10-CM | POA: Insufficient documentation

## 2020-06-09 DIAGNOSIS — M6281 Muscle weakness (generalized): Secondary | ICD-10-CM | POA: Insufficient documentation

## 2020-06-09 DIAGNOSIS — M79671 Pain in right foot: Secondary | ICD-10-CM | POA: Insufficient documentation

## 2020-06-09 NOTE — Telephone Encounter (Signed)
Spoke with pt regarding missed appointment today. She stated that there were issues with transportation and she is getting frustrated. I noted her next scheduled visit date/ time and that it maybe helpful for her to go ahead and notify transportation today and follow up with them on Monday to make sure everything is in-line.   Kristoffer Leamon PT, DPT, LAT, ATC  06/09/20  2:44 PM

## 2020-06-09 NOTE — Patient Outreach (Signed)
Care Coordination  06/09/2020  Michele Owens June 11, 1960 [id number]   RNCM returned patient's phone call.  Patient stated [facility] transportation did not pick her up today as scheduled and she missed her PT appointment.   Patient states she has called again and rescheduled appointment with them for transportation.  Patient states she will call them again tomorrow to confirm. Marland Kitchen  RNCM reviewed upcoming appointments with patient and answered her questions.    [name] RN, BSN [place]  [facility]ator - Managed Medicaid High Risk [phone].

## 2020-06-10 ENCOUNTER — Other Ambulatory Visit: Payer: Self-pay | Admitting: Obstetrics and Gynecology

## 2020-06-10 NOTE — Patient Instructions (Signed)
Visit Information  Ms. Jacelynn Kowal  - as a part of your Medicaid benefit, you are eligible for care management and care coordination services at no cost or copay. I was unable to reach you by phone today but would be happy to help you with your health related needs. Please feel free to call me at [phone].  A member of the Managed Medicaid care management team will reach out to you again over the next 7 days.   Beyza Bellino RN, BSN [place]  [facility] Care Management Coordinator - Managed Medicaid High Risk [phone].   

## 2020-06-10 NOTE — Patient Outreach (Signed)
Care Coordination  06/10/2020  Michele Owens May 23, 1960 [id number]    Medicaid Managed Care   Unsuccessful Outreach Note  06/10/2020 Name: Michele Owens MRN: [id number] DOB: 07/24/1960  Referred by: Everrett Coombe, MD Reason for referral : High Risk Managed Medicaid (Unsuccessful telephone outreach)   An unsuccessful telephone outreach was attempted today. The patient was referred to the case management team for assistance with care management and care coordination.   Follow Up Plan: A member of the Managed Medicaid  care management team will reach out to the patient again over the next 7 days.   [name] RN, BSN [place]  [facility]ator - Managed Medicaid High Risk [phone].

## 2020-06-11 ENCOUNTER — Telehealth: Payer: Self-pay | Admitting: *Deleted

## 2020-06-11 ENCOUNTER — Ambulatory Visit (INDEPENDENT_AMBULATORY_CARE_PROVIDER_SITE_OTHER): Payer: Medicaid Other | Admitting: Podiatry

## 2020-06-11 ENCOUNTER — Telehealth: Payer: Self-pay | Admitting: Podiatry

## 2020-06-11 ENCOUNTER — Other Ambulatory Visit: Payer: Self-pay | Admitting: Obstetrics and Gynecology

## 2020-06-11 ENCOUNTER — Other Ambulatory Visit: Payer: Self-pay

## 2020-06-11 DIAGNOSIS — Z5329 Procedure and treatment not carried out because of patient's decision for other reasons: Secondary | ICD-10-CM

## 2020-06-11 DIAGNOSIS — R0602 Shortness of breath: Secondary | ICD-10-CM

## 2020-06-11 NOTE — Progress Notes (Signed)
   Complete physical exam  Patient: Michele Owens   DOB: 01/28/2000   60 y.o. Female  MRN: [id number]  Subjective:    No chief complaint on file.   Michele Owens is a 60 y.o. female who presents today for a complete physical exam. She reports consuming a {diet types:17450} diet. {types:19826} She generally feels {DESC; WELL/FAIRLY WELL/POORLY:18703}. She reports sleeping {DESC; WELL/FAIRLY WELL/POORLY:18703}. She {does/does not:200015} have additional problems to discuss today.    Most recent fall risk assessment:    10/05/2021   10:42 AM  Fall Risk   Falls in the past year? 0  Number falls in past yr: 0  Injury with Fall? 0  Risk for fall due to : No Fall Risks  Follow up Falls evaluation completed     Most recent depression screenings:    10/05/2021   10:42 AM 08/26/2020   10:46 AM  PHQ 2/9 Scores  PHQ - 2 Score 0 0  PHQ- 9 Score 5     {VISON DENTAL STD PSA (Optional):27386}  {History (Optional):23778}  Patient Care Team: Jessup, Joy, NP as PCP - General (Nurse Practitioner)   Outpatient Medications Prior to Visit  Medication Sig   fluticasone (FLONASE) 50 MCG/ACT nasal spray Place 2 sprays into both nostrils in the morning and at bedtime. After 7 days, reduce to once daily.   norgestimate-ethinyl estradiol (SPRINTEC 28) 0.25-35 MG-MCG tablet Take 1 tablet by mouth daily.   Nystatin POWD Apply liberally to affected area 2 times per day   spironolactone (ALDACTONE) 100 MG tablet Take 1 tablet (100 mg total) by mouth daily.   No facility-administered medications prior to visit.    ROS        Objective:     There were no vitals taken for this visit. {Vitals History (Optional):23777}  Physical Exam   No results found for any visits on 11/10/21. {Show previous labs (optional):23779}    Assessment & Plan:    Routine Health Maintenance and Physical Exam  Immunization History  Administered Date(s) Administered   DTaP 04/13/1999, 06/09/1999,  08/18/1999, 05/03/2000, 11/17/2003   Hepatitis A 09/13/2007, 09/18/2008   Hepatitis B 01/29/1999, 03/08/1999, 08/18/1999   HiB (PRP-OMP) 04/13/1999, 06/09/1999, 08/18/1999, 05/03/2000   IPV 04/13/1999, 06/09/1999, 02/06/2000, 11/17/2003   Influenza,inj,Quad PF,6+ Mos 12/19/2013   Influenza-Unspecified 03/20/2012   MMR 02/05/2001, 11/17/2003   Meningococcal Polysaccharide 09/18/2011   Pneumococcal Conjugate-13 05/03/2000   Pneumococcal-Unspecified 08/18/1999, 11/01/1999   Tdap 09/18/2011   Varicella 02/06/2000, 09/13/2007    Health Maintenance  Topic Date Due   HIV Screening  Never done   Hepatitis C Screening  Never done   INFLUENZA VACCINE  11/08/2021   PAP-Cervical Cytology Screening  11/10/2021 (Originally 01/28/2020)   PAP SMEAR-Modifier  11/10/2021 (Originally 01/28/2020)   TETANUS/TDAP  11/10/2021 (Originally 09/17/2021)   HPV VACCINES  Discontinued   COVID-19 Vaccine  Discontinued    Discussed health benefits of physical activity, and encouraged her to engage in regular exercise appropriate for her age and condition.  Problem List Items Addressed This Visit   None Visit Diagnoses     Annual physical exam    -  Primary   Cervical cancer screening       Need for Tdap vaccination          No follow-ups on file.     Joy Jessup, NP   

## 2020-06-11 NOTE — Patient Outreach (Addendum)
Care Coordination  06/11/2020  Michele Owens March 14, 1961 [id number]   RNCM received phone call from patient regarding appointment today at [facility] appointment for patient.  [name] RN, BSN [place]  [facility]ator - Managed Medicaid High Risk [phone].

## 2020-06-11 NOTE — Telephone Encounter (Signed)
Patient calling to inform Dr March Rummage that [place] did not pick her up for her appointment today.

## 2020-06-11 NOTE — Patient Outreach (Signed)
Care Coordination  06/11/2020  Treniyah Lynn 01-Jul-1960 [id number]  Successful call to Ms. Michele Owens at the request of RNCM T. Craft. Michele Owens was upset that she had confused the date of her appointment at [facility]. This RNCM made a call to [facility] and a ride was arranged for Michele Owens.

## 2020-06-14 ENCOUNTER — Ambulatory Visit (INDEPENDENT_AMBULATORY_CARE_PROVIDER_SITE_OTHER): Payer: Medicaid Other | Admitting: Podiatry

## 2020-06-14 ENCOUNTER — Other Ambulatory Visit: Payer: Medicaid Other | Admitting: Obstetrics and Gynecology

## 2020-06-14 ENCOUNTER — Other Ambulatory Visit: Payer: Self-pay | Admitting: Obstetrics and Gynecology

## 2020-06-14 ENCOUNTER — Other Ambulatory Visit: Payer: Self-pay

## 2020-06-14 DIAGNOSIS — M722 Plantar fascial fibromatosis: Secondary | ICD-10-CM

## 2020-06-14 DIAGNOSIS — E0843 Diabetes mellitus due to underlying condition with diabetic autonomic (poly)neuropathy: Secondary | ICD-10-CM | POA: Diagnosis not present

## 2020-06-14 DIAGNOSIS — M79675 Pain in left toe(s): Secondary | ICD-10-CM | POA: Diagnosis not present

## 2020-06-14 DIAGNOSIS — B351 Tinea unguium: Secondary | ICD-10-CM | POA: Diagnosis not present

## 2020-06-14 DIAGNOSIS — M79674 Pain in right toe(s): Secondary | ICD-10-CM

## 2020-06-14 NOTE — Patient Outreach (Signed)
Medicaid Managed Care   Nurse Care Manager Note  06/14/2020 Name:  Michele Owens MRN:  [id number] DOB:  November 04, 1960  Michele Owens is an 60 y.o. year old female who is a primary patient of [name], Harvel Quale, MD.  The [facility] Managed Care Coordination team was consulted for assistance with:    chronic healthcare management needs.  Michele Owens was given information about Medicaid Managed Care Coordination team services today. Michele Owens agreed to services and verbal consent obtained.  Engaged with patient by telephone for follow up visit in response to provider referral for case management and/or care coordination services.   Assessments/Interventions:  Review of past medical history, allergies, medications, health status, including review of consultants reports, laboratory and other test data, was performed as part of comprehensive evaluation and provision of chronic care management services.  SDOH (Social Determinants of Health) assessments and interventions performed:   Care Plan  Allergies  Allergen Reactions  . Bee Venom Swelling and Other (See Comments)    "All over my body" (swelling)  . Fioricet [Butalbital-Apap-Caffeine] Nausea And Vomiting and Rash  . Ibuprofen Rash and Other (See Comments)    Severe rash  . Lamisil [Terbinafine] Rash and Other (See Comments)    Pt states this causes her to "feel funny"  . Nsaids Other (See Comments)    Per MD's orders     Medications Reviewed Today    Reviewed by Danie Chandler, RN (Registered Nurse) on 06/14/20 at 1154  Med List Status: <None>  Medication Order Taking? Sig Documenting Provider Last Dose Status Informant  albuterol (PROVENTIL HFA;VENTOLIN HFA) 108 (90 Base) MCG/ACT inhaler [id number] No Inhale 1-2 puffs into the lungs every 6 (six) hours as needed for wheezing or shortness of breath. Long, Arlyss Repress, MD unk Active Multiple Informants  allopurinol (ZYLOPRIM) 100 MG tablet [id number] No Take 100 mg by mouth daily.  [provider] 04/17/2020 Active Multiple Informants           Med Note Katrinka Blazing, [name]   Tue Oct 23, 2017  2:58 AM)    Ailene Ards ELLIPTA 62.5-25 MCG/INH AEPB [id number] No Inhale 1 puff into the lungs daily.  [provider] 04/18/2020 Unknown time Active Multiple Informants  benzonatate (TESSALON) 200 MG capsule [id number] No Take 1 capsule (200 mg total) by mouth in the morning, at noon, and at bedtime. 7 DS Hartley Barefoot A, MD 04/16/2020 Active Multiple Informants  Blood Glucose Monitoring Suppl (ACCU-CHEK AVIVA PLUS) w/Device KIT [id number] No 1 each by Other route daily.  [provider] unk unk Active Multiple Informants  calcitRIOL (ROCALTROL) 0.5 MCG capsule [id number] No Take 1 capsule (0.5 mcg total) by mouth daily. Elease Etienne, MD 04/17/2020 Active Multiple Informants           Med Note Katrinka Blazing, [name]   Tue Oct 23, 2017  2:59 AM)    carvedilol (COREG) 6.25 MG tablet [id number] No Take 1 tablet (6.25 mg total) by mouth 2 (two) times daily with a meal. Bensimhon, Bevelyn Buckles, MD 04/17/2020 1700 Active Multiple Informants  clonazePAM (KLONOPIN) 2 MG tablet [id number] No Take 1 tablet (2 mg total) by mouth 3 (three) times daily. for anxiety Jacalyn Lefevre, MD 04/18/2020 Unknown time Active Multiple Informants  cyclobenzaprine (FLEXERIL) 10 MG tablet [id number] No Take 10 mg by mouth 3 (three) times daily. [provider] 04/17/2020 Active Multiple Informants  diclofenac sodium (VOLTAREN) 1 % GEL [id number] No Apply 2 g topically 4 (four) times daily as needed (for  pain). [name], MD Past Week Unknown time Active Multiple Informants  EPINEPHrine 0.3 mg/0.3 mL IJ SOAJ injection [id number] No Inject 0.3 mg into the muscle daily as needed (allergic reaction). [provider] unk Active Multiple Informants  FEROSUL 325 (65 Fe) MG tablet [id number] No Take 325 mg by mouth daily. [provider] 04/17/2020 Active Multiple Informants           Med Note  [name], [name]   Tue Oct 23, 2017  2:57 AM)    glucose blood test strip [id number] No Use as instructed to check blood sugar up to 4 times daily  Patient taking differently: 1 each by Other route See admin instructions. Use as instructed to check blood sugar up to 4 times daily   [name], DO unk unk Active Multiple Informants  guaiFENesin (MUCINEX) 600 MG 12 hr tablet [id number] No Take 2 tablets (1,200 mg total) by mouth 2 (two) times daily. Regalado, Belkys A, MD unk Active Multiple Informants  ipratropium-albuterol (DUONEB) 0.5-2.5 (3) MG/3ML SOLN [id number] No Take 3 mLs by nebulization 4 (four) times daily.  [provider] unk Active Multiple Informants           Med Note [name], [name]   Tue Oct 23, 2017  2:57 AM)    levothyroxine (SYNTHROID, LEVOTHROID) 112 MCG tablet [id number] No Take 224 mcg by mouth daily. [provider] 04/18/2020 Unknown time Active Multiple Informants           Med Note [name], [name]   Tue Oct 23, 2017  2:57 AM)    lisinopril (PRINIVIL,ZESTRIL) 5 MG tablet [id number] No Take 1 tablet (5 mg total) by mouth daily. Please call for office visit [phone] [name], MD 04/18/2020 Unknown time Active Multiple Informants           Med Note [name], [name]   Tue Oct 23, 2017  2:58 AM)    loperamide (IMODIUM) 2 MG capsule [id number] No Take 1 capsule (2 mg total) by mouth 4 (four) times daily as needed for diarrhea or loose stools. [name], MD unk Active Multiple Informants  metFORMIN (GLUCOPHAGE) 500 MG tablet [id number] No Take 1 tablet (500 mg total) by mouth 2 (two) times daily with a meal. [name], MD 04/17/2020 Active Multiple Informants           Med Note [name], [name]   Tue Oct 23, 2017  2:58 AM)    nitroGLYCERIN (NITROSTAT) 0.4 MG SL tablet [id number] No Place 1 tablet (0.4 mg total) under the tongue every 5 (five) minutes as needed for chest pain. [name], MD unk Active Multiple Informants  nystatin  cream (MYCOSTATIN) [id number] No Apply 1 application topically 2 (two) times daily as needed for dry skin. Regalado, Belkys A, MD 04/16/2020 Active Multiple Informants  omeprazole (PRILOSEC) 20 MG capsule [id number] No Take 20 mg by mouth 2 (two) times daily before a meal. [provider] 04/17/2020 Active Multiple Informants  oxyCODONE-acetaminophen (PERCOCET) 10-325 MG tablet [id number] No Take 1 tablet by mouth every 6 (six) hours as needed for pain. [provider] 04/17/2020 Active Multiple Informants           Med Note Marcellina Millin Apr 12, 2020  1:22 PM) LF on 03-25-20 # 120 DS 30  OXYGEN [id number] No Inhale 2-3 L into the lungs continuous. [provider] 01/14/2020 Unknown time Active Multiple Informants  Med Note Corky Mull   Mon Apr 12, 2020 10:08 AM)    potassium chloride SA (K-DUR,KLOR-CON) 20 MEQ tablet [id number] No Take 1 tablet (20 mEq total) by mouth 2 (two) times daily. [name], MD 04/17/2020 Active Multiple Informants  QC STOOL SOFTENER PLS LAXATIVE 8.6-50 MG tablet [id number] No Take 1 tablet by mouth at bedtime. [provider] 04/17/2020 Active Multiple Informants           Med Note [name], [name]   Tue Oct 23, 2017  2:56 AM)    rosuvastatin (CRESTOR) 10 MG tablet [id number] No Take 10 mg by mouth every evening. [provider] 04/16/2020 Active Multiple Informants  torsemide (DEMADEX) 20 MG tablet [id number]  TAKE 4 (FOUR) TABLETS BY MOUTH DAILY (2AM+ 2NOON) [name], MD  Active   traZODone (DESYREL) 50 MG tablet [id number] No Take 100 mg by mouth at bedtime. [provider] 04/17/2020 Active Multiple Informants          Patient Active Problem List   Diagnosis Date Noted  . Acute on chronic respiratory failure with hypoxia and hypercapnia ([place]) 04/18/2020  . Acute on chronic respiratory failure with hypoxia ([place]) 04/18/2020  . Hemoptysis 04/18/2020  . Acute metabolic encephalopathy  02/04/2535  . Acute encephalopathy 04/18/2020  . Chronic diastolic CHF (congestive heart failure) ([place]) 04/18/2020  . Sepsis ([place]) 04/18/2020  . Morbid obesity with BMI of 50.0-59.9, adult ([place])   . Fall at home, initial encounter 06/23/2018  . HNP (herniated nucleus pulposus), thoracic 06/23/2018  . CHF (congestive heart failure) ([place]) 06/18/2018  . Hypertensive disorder 08/28/2017  . Obesity 08/28/2017  . COPD exacerbation ([place]) 08/04/2017  . Acute and chronic respiratory failure with hypercapnia ([place])   . Pulmonary HTN ([place])   . Genetic testing 02/27/2017  . Family history of thyroid cancer   . Hypomagnesemia 10/03/2016  . Atypical chest pain   . HLD (hyperlipidemia) 09/23/2016  . Gout 09/23/2016  . DVT (deep vein thrombosis) in pregnancy 09/23/2016  . Thyroid cancer ([place]) 09/23/2016  . Hypocalcemia 09/23/2016  . AKI (acute kidney injury) ([place]) 09/23/2016  . Acute pulmonary edema (H[facility])   . Acute hypoxemic respiratory failure ([place])   . Ventilator dependent ([place])   . Chronic respiratory failure with hypoxia and hypercapnia ([place]) 07/16/2016  . CAP (community acquired pneumonia) 01/23/2016  . 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 Pulmonary hypertension ([place]) 01/23/2016  . Obesity hypoventilation syndrome ([place]) 08/26/2015  . Dyslipidemia associated with type 2 diabetes mellitus ([place]) 04/24/2015  . Leukocytosis 04/24/2015  . Controlled type 2 diabetes mellitus without complication, without long-term current use of insulin ([place]) 04/24/2015  . Anxiety 04/24/2015  . Benign essential HTN 04/24/2015  . Normocytic anemia 04/24/2015  . OSA (obstructive sleep apnea) 05/10/2012  . Tobacco abuse 07/04/2011    Conditions to be addressed/monitored per PCP order:  chronic healthcare management needs-OSA, DM2, obesity, anxiety  Care Plan : General Plan of Care (Adult)  Updates made by [name], RN since 06/14/2020 12:00 AM    Problem: Barriers to  Treatment     Long-Range Goal: Barriers to Treatment Identified and Managed   Start Date: 05/03/2020  Expected End Date: 08/01/2020  Recent Progress: Not on track  Priority: High  Note:   [place] (see longitudinal plan of care for additional care plan information)  Current Barriers:  . Transportation barriers-patient utilizing [employer] transportation as needed. . Limited access to caregiver-patient needs  PCS, currently not receiving services she is expecting.  Patient states she is now receiving PCS.  Nurse Clinical Goal(s):  Marland Kitchen Over the next 30 days, patient will work with [employer] to address needs related to transportation. Marland Kitchen Update 05/12/20:  Patient has used [employer] and is very pleased. . Over the next 90 days, patient will attend all scheduled medical appointments. Marland Kitchen RNCM will make referral to SW for assistance with PCS . Update 05/20/20:  Patient states she is now receiving PCS from [facility] 3 days a week for 4 hours each day. Marland Kitchen Update 05/27/20: BSW spoke with patient about PCS, she stated someone usually comes in 3 days a week for a few hours. She stated her aide was off today. BSW contacted [place] and spoke with [facility], she stated that patient has 13 hours per week with 6 days a week, however patient chooses to have an aide come out 3 days a week so that she can have more hours. The aide assist with most ADL's. Marland Kitchen Update 06/04/20: Patient stated she is ready to move to [facility]. She has already started the process but wants to save some money first and she has to wait on her nephew who will drive the uhaul truck.  Interventions: . Inter-disciplinary care team collaboration (see longitudinal plan of care) . Referred patient to community resources care guide team for assistance with transportation. . Provided patient with information about [employer]. . Discussed plans with  patient for ongoing care management follow up and provided patient with direct contact information for care management team . Advised patient to call [place]. Marland Kitchen Collaborated with [facility] regarding transportation. . Assisted patient/caregiver with obtaining information about health plan benefits . Collaborated with Social Work regarding [employer]. Marland Kitchen Update 06/14/20:  Patient continues to utilize [employer], aide, [employer] as needed-has missed appointments due to transportation "no shows"-will continue to follow.  Patient Self Care Activities:  . Patient will call [employer]. Marland Kitchen Update 06/07/20:  Patient continues to utilize [facility] with success. Marland Kitchen Update 06/14/20:  Patient has missed appointments due to transportation no shows.  [facility] transportation has been utilized as needed.    Follow Up:  Patient agrees to Care Plan and Follow-up.  Plan: The Managed Medicaid care management team will reach out to the patient again over the next 30 days. and The patient has been provided with contact information for the Managed Medicaid care management team and has been advised to call with any health related questions or concerns.  Date/time of next scheduled RN care management/care coordination outreach:  07/15/20 at 1030.

## 2020-06-14 NOTE — Patient Instructions (Signed)
Visit Information  Ms. Michele Owens was given information about Medicaid Managed Care team care coordination services as a part of their [facility] Medicaid benefit. [name] verbally consented to engagement with the [facility] Managed Care team.   For questions related to your [name] health plan, please call: [phone]  If you would like to schedule transportation through your [facility] plan, please call the following number at least 2 days in advance of your appointment: [phone]   Ms. Michele Owens - following are the goals we discussed in your visit today:  Goals Addressed            This Visit's Progress   . Protect My Health       Timeframe:  Long-Range Goal Priority:  Medium Start Date:    04/23/20                         Expected End Date:       07/22/20                Follow Up Date 07/15/20.   - schedule appointment for vaccines needed due to my age or health - schedule recommended health tests (blood work, mammogram, colonoscopy, pap test) - schedule and keep appointment for annual check-up  Patient has PT appointment 05/19/20. Update 05/20/20:  Patient unable to attend PT appointment on 05/19/20.  Has next PT appointment 05/26/20.  Cardiology appointment 05/28/20. Update 05/24/20:  Attempted to reach patient today-she is at Urgent Care with stomach pain.  Will reschedule. Update 05/25/20:  Patient seen at Urgent Care and [place] 05/24/20.  Patient with abscess- given antibiotics. Update 06/01/20:  Patient continues to improve, HHRN cleaning and changing dressing. Update 06/07/20:  Patient states abscess continues to improve.  Has PT on Wednesday, 06/09/20. Update 06/14/20:  Abscess healed.  Patient missed PT appointment as transportation did not pick her up-appointment rescheduled to 06/16/20.       Patient verbalizes understanding of instructions provided today.   The Managed Medicaid care management team will reach out to the patient again over the next 30 days.  The  patient has been provided with contact information for the Managed Medicaid care management team and has been advised to call with any health related questions or concerns.   [name] RN, BSN [place]  Tri[facility] Curator - Managed Medicaid High Risk [phone].  Following is a copy of your plan of care:  Patient Care Plan: General Plan of Care (Adult)    Problem Identified: Health Promotion or Disease Self-Management (General Plan of Care)   Priority: Medium  Onset Date: 04/23/2020    Problem Identified: Barriers to Treatment     Long-Range Goal: Barriers to Treatment Identified and Managed   Start Date: 05/03/2020  Expected End Date: 08/01/2020  Recent Progress: Not on track  Priority: High  Note:   CARE PLAN ENTRY Medicaid Managed Care (see longitudinal plan of care for additional care plan information)  Current Barriers:  . Transportation barriers-patient utilizing [employer] transportation as needed. . Limited access to caregiver-patient needs PCS, currently not receiving services she is expecting.  Patient states she is now receiving PCS.  Nurse Clinical Goal(s):  Marland Kitchen Over the next 30 days, patient will work with [employer] to address needs related to transportation. Marland Kitchen Update 05/12/20:  Patient has used [employer] and is very pleased. . Over the next 90 days, patient will attend all scheduled medical appointments. Marland Kitchen RNCM  will make referral to SW for assistance with PCS . Update 05/20/20:  Patient states she is now receiving PCS from [facility] 3 days a week for 4 hours each day. Marland Kitchen Update 05/27/20: BSW spoke with patient about PCS, she stated someone usually comes in 3 days a week for a few hours. She stated her aide was off today. BSW contacted [place] and spoke with [facility], she stated that patient has 13 hours per week with 6 days a week, however patient chooses to have an aide come out  3 days a week so that she can have more hours. The aide assist with most ADL's. Marland Kitchen Update 06/04/20: Patient stated she is ready to move to [facility]. She has already started the process but wants to save some money first and she has to wait on her nephew who will drive the uhaul truck.  Interventions: . Inter-disciplinary care team collaboration (see longitudinal plan of care) . Referred patient to community resources care guide team for assistance with transportation. . Provided patient with information about [employer]. . Discussed plans with patient for ongoing care management follow up and provided patient with direct contact information for care management team . Advised patient to call [place]. Marland Kitchen Collaborated with [facility] regarding transportation. . Assisted patient/caregiver with obtaining information about health plan benefits . Collaborated with Social Work regarding [employer]. Marland Kitchen Update 06/14/20:  Patient continues to utilize [employer], aide, [employer] as needed-has missed appointments due to transportation "no shows"-will continue to follow.  Patient Self Care Activities:  . Patient will call [employer]. Marland Kitchen Update 06/07/20:  Patient continues to utilize [facility] with success. Marland Kitchen Update 06/14/20:  Patient has missed appointments due to transportation no shows.  [facility] transportation has been utilized as needed.          Patient Care Plan: General Plan of Care (Adult)    Problem Identified: Health Promotion or Disease Self-Management (General Plan of Care)   Priority: High  Onset Date: 04/23/2020    Long-Range Goal: Self-Management Plan Developed   Start Date: 04/23/2020  Expected End Date: 07/22/2020  Recent Progress: Not on track  Priority: Medium  Note:   Current Barriers:  . Chronic Disease Management support and education needs.  Patient with abdominal wall infection-seen at [facility] ED 05/24/20 - I&D  performed. Marland Kitchen Update 06/01/20:  Patient states she is improving-HHRN is cleaning and dressing infection. Marland Kitchen Update 06/07/20:  Infection continues to improve per patient, HHRN continues to clean and dress infection site . Update 06/14/20:  Abscess healed.  Nurse Case Manager Clinical Goal(s):  Marland Kitchen Over the next 30 days, patient will attend all scheduled medical appointments:  . Over the next 30 days, patient will work with CM team pharmacist to review medications. Marland Kitchen Update 05/20/20:  Patient has appointment with Pharmacist 05/21/20. Marland Kitchen Update 05/25/20:  Patient declines to speak with Pharmacist at this time.  Interventions:  . Inter-disciplinary care team collaboration (see longitudinal plan of care) . Evaluation of current treatment plan and patient's adherence to plan as established by provider. . Reviewed medications with patient. [name] with pharmacist regarding medications. . Discussed plans with patient for ongoing care management follow up and provided patient with direct contact information for care management team . Reviewed scheduled/upcoming provider appointments.  Marland Kitchen Update 05/20/20:  Patient unable to attend PT appointment 05/19/20, has next appointment 05/26/20.  Has Cardiology appointment 05/28/20.  Reviewed [place] information with patient again and  with aide. Marland Kitchen Update 06/07/20:  Patient has PT appointment 06/09/20. Marland Kitchen Pharmacy referral for medication review. Marland Kitchen Update 05/24/20: Pharmacist has attempted to speak with patient and has been unable to.  Patient at Urgent Care today with stomach pain.  RNCM will reschedule appointment with patient and speak with her regarding importance of speaking with Pharmacist. . Update 05/25/20: Patient declines to speak with Pharmacist at this time. Marland Kitchen Update 06/14/20:  Appointment with [place] today at 415, PT appointment 06/16/20.  Patient Goals/Self-Care Activities Over the next 30 days, patient will:  -Attends all scheduled provider  appointments Calls pharmacy for medication refills Calls provider office for new concerns or questions  Follow Up Plan: The Managed Medicaid care management team will reach out to the patient again over the next 30 days.  The patient has been provided with contact information for the Managed Medicaid care management team and has been advised to call with any health related questions or concerns.

## 2020-06-14 NOTE — Patient Outreach (Signed)
Care Coordination  06/14/2020  Michele Owens 1960-12-02 [id number]   RNCM returned patient's phone call.  Patient confirming she has transportation today for 415 appointment at [facility].  Patient states she will be picked up at 345.  [name] RN, BSN [place]  Triad Curator - Managed Medicaid High Risk [phone].

## 2020-06-16 ENCOUNTER — Other Ambulatory Visit: Payer: Self-pay

## 2020-06-16 ENCOUNTER — Other Ambulatory Visit: Payer: Medicaid Other | Admitting: Obstetrics and Gynecology

## 2020-06-16 ENCOUNTER — Ambulatory Visit: Payer: Medicaid Other

## 2020-06-16 DIAGNOSIS — Z7689 Persons encountering health services in other specified circumstances: Secondary | ICD-10-CM | POA: Diagnosis not present

## 2020-06-16 DIAGNOSIS — M79671 Pain in right foot: Secondary | ICD-10-CM | POA: Diagnosis not present

## 2020-06-16 DIAGNOSIS — M6281 Muscle weakness (generalized): Secondary | ICD-10-CM

## 2020-06-16 DIAGNOSIS — R262 Difficulty in walking, not elsewhere classified: Secondary | ICD-10-CM

## 2020-06-16 DIAGNOSIS — M79672 Pain in left foot: Secondary | ICD-10-CM

## 2020-06-16 NOTE — Therapy (Addendum)
[place], [place], [address] Phone: [phone]   Fax:  [phone]  Physical Therapy Treatment/Discharge  Patient Details  Name: Michele Owens MRN: [id number] Date of Birth: 1960/09/14 Referring Provider (PT): Criselda Peaches, DPM (03/17/2020)   Encounter Date: 06/16/2020   PT End of Session - 06/16/20 1716    Visit Number 2    Number of Visits 7    Date for PT Re-Evaluation 06/19/20    Authorization Type [facility] - submitted for auth 05/05/2020    Authorization Time Period 05/18/20-07/17/20    Authorization - Visit Number 1    Authorization - Number of Visits 12    PT Start Time 9357    PT Stop Time 1715    PT Time Calculation (min) 38 min    Equipment Utilized During Treatment Oxygen    Activity Tolerance Patient tolerated treatment well    Behavior During Therapy [facility] for tasks assessed/performed   distracted          Past Medical History:  Diagnosis Date  . Anxiety   . Arthritis   . Asthma   . Chronic diastolic CHF (congestive heart failure) ([place])   . COPD (chronic obstructive pulmonary disease) (HCC)    Uses Oxygen at night  . Depression   . Diabetes mellitus without complication ([place])   . Dyspnea    occ  . Family history of thyroid cancer   . GERD (gastroesophageal reflux disease)   . Gout   . Headache    migraines  . History of DVT of lower extremity   . History of nuclear stress test    Myoview 2/17:  Low risk stress nuclear study with a small, moderate intensity, partially reversible inferior lateral defect consistent with small prior infarct and minimal peri-infarct ischemia; EF 68 with normal wall motion  . Hypertension   . Pneumonia   . Thyroid cancer ([place]) 09/23/2016    Past Surgical History:  Procedure Laterality Date  . BUNIONECTOMY Bilateral   . CARDIAC CATHETERIZATION N/A 06/23/2015   Procedure: Right/Left Heart Cath and Coronary Angiography;  Surgeon: [name], MD;  Location: [place] [facility];  Service: Cardiovascular;  Laterality: N/A;  . COLONOSCOPY WITH PROPOFOL N/A 12/15/2015   Procedure: COLONOSCOPY WITH PROPOFOL;  Surgeon: [name], MD;  Location: [name];  Service: Endoscopy;  Laterality: N/A;  . RIGHT HEART CATH N/A 10/01/2019   Procedure: RIGHT HEART CATH;  Surgeon: [name], MD;  Location: [place] [facility];  Service: Cardiovascular;  Laterality: N/A;  . THYROIDECTOMY  09/19/2016  . THYROIDECTOMY N/A 09/19/2016   Procedure: TOTAL THYROIDECTOMY;  Surgeon: [name], MD;  Location: [place];  Service: General;  Laterality: N/A;  . TONSILLECTOMY    . TOTAL ABDOMINAL HYSTERECTOMY  07/14/10    There were no vitals filed for this visit.   Subjective Assessment - 06/16/20 1640    Subjective Patient reports she is feeling about the same.    Currently in Pain? Yes    Pain Score 8     Pain Location Foot    Pain Orientation Right;Left    Pain Descriptors / Indicators --   "just hurts a little bit"   Pain Type Chronic pain              [facility] PT Assessment - 06/16/20 0001      Strength   Right Ankle Inversion 5/5    Right Ankle Eversion 5/5  Left Ankle Inversion 5/5    Left Ankle Eversion 5/5                         [facility] Adult PT Treatment/Exercise - 06/16/20 0001      Self-Care   Self-Care Other Self-Care Comments    Other Self-Care Comments  see patient education      Knee/Hip Exercises: Seated   Long Arc Quad 2 sets;10 reps    Clamshell with TheraBand Green   2 x 10   Other Seated Knee/Hip Exercises seated calf raise 2 x 20    Marching 2 sets;10 reps    Sit to [employer] 10 reps                  PT Education - 06/16/20 1840    Education Details recommended f/u with PCP for back pain. Education on HEP. Posture education    Person(s) Educated Patient    Methods Explanation;Demonstration;Verbal cues;Handout    Comprehension Verbalized understanding;Returned demonstration;Need  further instruction;Verbal cues required            PT Short Term Goals - 05/05/20 1813      PT SHORT TERM GOAL #1   Title Patient will be independent with initial HEP.    Baseline Plan to establish initial HEP at 2nd visit.    Time 3    Period Weeks    Status New    Target Date 05/26/20      PT SHORT TERM GOAL #2   Title Patient will be able to ambulate 50 feet with </= 7/10 pain in bilateral feet.    Baseline 10/10 bilateral feet pain at rest during evaluation    Time 3    Period Weeks    Status New    Target Date 05/26/20      PT SHORT TERM GOAL #3   Title Patient will report </= 8/10 bilateral feet pain at rest.    Baseline 10/10 pain at rest    Time 3    Period Weeks    Status New    Target Date 05/26/20      PT SHORT TERM GOAL #4   Title Patient will increase R hip FL to at least 3/5 upon MMT reassessment.    Baseline 3-/5    Time 3    Period Weeks    Status New    Target Date 05/26/20             PT Long Term Goals - 05/05/20 1815      PT LONG TERM GOAL #1   Title Patient will be independent with advanced HEP.    Baseline Plan to establish initial HEP at 2nd visit.    Time 6    Period Weeks    Status New    Target Date 06/16/20      PT LONG TERM GOAL #2   Title Patient will be able to ambulate 100 feet with </= 7/10 pain in bilateral feet.    Baseline 10/10 pain at rest during evaluation    Time 6    Period Weeks    Status New    Target Date 06/16/20      PT LONG TERM GOAL #3   Title Patient will increase BLE MMT to at least 4/5 grossly    Baseline See flowsheet    Time 6    Period Weeks    Status New    Target Date 06/16/20  PT LONG TERM GOAL #4   Title Patient will report </= 6/10 bilateral feet pain at rest.    Baseline 10/10 at rest during evaluation    Time 6    Period Weeks    Status New    Target Date 06/16/20                 Plan - 06/16/20 1719    Clinical Impression Statement Patient arrives for first  follow-up after initial evaluation reporting no significant change in her foot pain. Overall good tolerance to today's session without reports of increased foot pain, though occasional complaints of knee and back pain. Able to begin LE strengthening exercises in seated with patient becoming easily distracted throughout session requiring cues to remain on task with prescribed exercises. Time spent educating patient on HEP as she has tendency to complete more reps than instructed to perform during session with patient verablizing understanding of prescribed sets/reps as part of HEP. Patient also brought up treatment for her low back pain and was recommended to f/u with physician for these concerns as her current referral is for her foot pain.    Personal Factors and Comorbidities Age;Comorbidity 3+;Fitness;Past/Current Experience;Time since onset of injury/illness/exacerbation    Comorbidities See PMH above    Examination-Activity Limitations Bathing;Bed Mobility;Bend;Carry;Dressing;Lift;Locomotion Level;Sit;Sleep;Squat;Stairs;Stand;Toileting   "pain all the time"   Examination-Participation Restrictions Cleaning;Community Activity;Driving;Meal Prep;Shop;Laundry    Stability/Clinical Decision Making Unstable/Unpredictable    Rehab Potential Poor    PT Frequency 1x / week    PT Duration 6 weeks    PT Treatment/Interventions ADLs/Self Care Home Management;Aquatic Therapy;Cryotherapy;Electrical Stimulation;Iontophoresis 51m/ml Dexamethasone;Moist Heat;Traction;Neuromuscular re-education;Balance training;Therapeutic exercise;Therapeutic activities;Functional mobility training;Stair training;Gait training;Patient/family education;Manual techniques;Energy conservation;Dry needling;Passive range of motion;Taping    PT Next Visit Plan progress BLE strengthening as tolerated, gait training, balance    PT Home Exercise Plan Access Code [email or web address]   Consulted and Agree with Plan of Care Patient           Patient  will benefit from skilled therapeutic intervention in order to improve the following deficits and impairments:  Decreased activity tolerance,Decreased balance,Decreased mobility,Decreased strength,Decreased endurance,Decreased range of motion,Abnormal gait,Difficulty walking,Increased muscle spasms,Impaired sensation,Improper body mechanics,Obesity  Visit Diagnosis: Pain in left foot  Pain in right foot  Difficulty in walking, not elsewhere classified  Muscle weakness (generalized)     Problem List Patient Active Problem List   Diagnosis Date Noted  . Acute on chronic respiratory failure with hypoxia and hypercapnia ([place] 04/18/2020  . Acute on chronic respiratory failure with hypoxia ([place] 04/18/2020  . Hemoptysis 04/18/2020  . Acute metabolic encephalopathy 054/12/8117 . Acute encephalopathy 04/18/2020  . Chronic diastolic CHF (congestive heart failure) ([facility] 04/18/2020  . Sepsis ([place] 04/18/2020  . Morbid obesity with BMI of 50.0-59.9, adult ([facility]   . Fall at home, initial encounter 06/23/2018  . HNP (herniated nucleus pulposus), thoracic 06/23/2018  . CHF (congestive heart failure) ([facility] 06/18/2018  . Hypertensive disorder 08/28/2017  . Obesity 08/28/2017  . COPD exacerbation ([place] 08/04/2017  . Acute and chronic respiratory failure with hypercapnia ([facility]   . Pulmonary HTN ([facility]   . Genetic testing 02/27/2017  . Family history of thyroid cancer   . Hypomagnesemia 10/03/2016  . Atypical chest pain   . HLD (hyperlipidemia) 09/23/2016  . Gout 09/23/2016  . DVT (deep vein thrombosis) in pregnancy 09/23/2016  . Thyroid cancer ([facility] 09/23/2016  . Hypocalcemia 09/23/2016  . AKI (acute kidney injury) ([facility] 09/23/2016  . Acute pulmonary edema (HCC)   .  Acute hypoxemic respiratory failure ([place])   . Ventilator dependent ([place])   . Chronic respiratory failure with hypoxia and hypercapnia ([place]) 07/16/2016  . CAP (community acquired pneumonia) 01/23/2016  . 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 Pulmonary hypertension ([place]) 01/23/2016  . Obesity hypoventilation syndrome ([place]) 08/26/2015  . Dyslipidemia associated with type 2 diabetes mellitus ([place]) 04/24/2015  . Leukocytosis 04/24/2015  . Controlled type 2 diabetes mellitus without complication, without long-term current use of insulin ([place]) 04/24/2015  . Anxiety 04/24/2015  . Benign essential HTN 04/24/2015  . Normocytic anemia 04/24/2015  . OSA (obstructive sleep apnea) 05/10/2012  . Tobacco abuse 07/04/2011   Gwendolyn Grant, PT, DPT, ATC 06/16/20 6:43 PM  PHYSICAL THERAPY DISCHARGE SUMMARY  Visits from Start of Care: 2  Current functional level related to goals / functional outcomes: Goals not formally reassessed.    Remaining deficits: See above   Education / Equipment: See above   Plan: Patient agrees to discharge.  Patient goals were not met. Patient is being discharged due to not returning since the last visit.  ?????        Gwendolyn Grant, PT, DPT, ATC 07/20/20 10:32 AM  [facility] [address] [place], [place], [address] Phone: [phone]   Fax:  [phone]  Name: Kahleah Crass MRN: [id number] Date of Birth: [birth year]

## 2020-06-16 NOTE — Patient Outreach (Signed)
Care Coordination  06/16/2020  Lilianne Delair 27-Jan-1961 [id number]   RNCM returned patient's phone call.  Patient states she is at her PT appointment-answered patient's questions.  [name] RN, BSN [place]  [employer] Managed [employer] High Risk [phone]

## 2020-06-17 ENCOUNTER — Other Ambulatory Visit: Payer: Medicaid Other | Admitting: Obstetrics and Gynecology

## 2020-06-17 NOTE — Patient Outreach (Signed)
Care Coordination  06/17/2020  Michele Owens 12/30/1960 [id number]   RNCM returned patient's phone call.  Patient wanted to update RNCM regarding PT appointment 06/16/20.  Patient states appointment went well and she feels better.  [name] RN, BSN [place]  [employer] - Managed Medicaid High Risk [phone].

## 2020-06-21 ENCOUNTER — Other Ambulatory Visit: Payer: Self-pay

## 2020-06-21 ENCOUNTER — Other Ambulatory Visit: Payer: Medicaid Other | Admitting: Obstetrics and Gynecology

## 2020-06-21 NOTE — Patient Outreach (Signed)
Care Coordination  06/21/2020  Michele Owens 08-Sep-1960 [id number]   RNNCM returned patient's phone call, no answer-left message.  [name] RN, BSN [place]  [employer] - Managed Medicaid High Risk [phone].

## 2020-06-22 ENCOUNTER — Other Ambulatory Visit: Payer: Self-pay

## 2020-06-22 ENCOUNTER — Encounter (HOSPITAL_COMMUNITY): Payer: Self-pay | Admitting: Emergency Medicine

## 2020-06-22 ENCOUNTER — Other Ambulatory Visit: Payer: Medicaid Other | Admitting: Obstetrics and Gynecology

## 2020-06-22 ENCOUNTER — Emergency Department (HOSPITAL_COMMUNITY): Payer: Medicaid Other

## 2020-06-22 ENCOUNTER — Emergency Department (HOSPITAL_COMMUNITY)
Admission: EM | Admit: 2020-06-22 | Discharge: 2020-06-22 | Disposition: A | Discharge status: Home / Self Care | Payer: Medicaid Other | Attending: Emergency Medicine | Admitting: Emergency Medicine

## 2020-06-22 DIAGNOSIS — R9431 Abnormal electrocardiogram [ECG] [EKG]: Secondary | ICD-10-CM | POA: Diagnosis not present

## 2020-06-22 DIAGNOSIS — Z7984 Long term (current) use of oral hypoglycemic drugs: Secondary | ICD-10-CM | POA: Diagnosis not present

## 2020-06-22 DIAGNOSIS — K219 Gastro-esophageal reflux disease without esophagitis: Secondary | ICD-10-CM | POA: Insufficient documentation

## 2020-06-22 DIAGNOSIS — Z85850 Personal history of malignant neoplasm of thyroid: Secondary | ICD-10-CM | POA: Insufficient documentation

## 2020-06-22 DIAGNOSIS — Z87891 Personal history of nicotine dependence: Secondary | ICD-10-CM | POA: Diagnosis not present

## 2020-06-22 DIAGNOSIS — I5032 Chronic diastolic (congestive) heart failure: Secondary | ICD-10-CM | POA: Diagnosis not present

## 2020-06-22 DIAGNOSIS — J9811 Atelectasis: Secondary | ICD-10-CM | POA: Diagnosis not present

## 2020-06-22 DIAGNOSIS — J45909 Unspecified asthma, uncomplicated: Secondary | ICD-10-CM | POA: Insufficient documentation

## 2020-06-22 DIAGNOSIS — R079 Chest pain, unspecified: Secondary | ICD-10-CM | POA: Diagnosis not present

## 2020-06-22 DIAGNOSIS — I110 Hypertensive heart disease with heart failure: Secondary | ICD-10-CM | POA: Diagnosis not present

## 2020-06-22 DIAGNOSIS — M25562 Pain in left knee: Secondary | ICD-10-CM | POA: Diagnosis not present

## 2020-06-22 DIAGNOSIS — R1084 Generalized abdominal pain: Secondary | ICD-10-CM | POA: Diagnosis not present

## 2020-06-22 DIAGNOSIS — J441 Chronic obstructive pulmonary disease with (acute) exacerbation: Secondary | ICD-10-CM | POA: Insufficient documentation

## 2020-06-22 DIAGNOSIS — Z7951 Long term (current) use of inhaled steroids: Secondary | ICD-10-CM | POA: Insufficient documentation

## 2020-06-22 DIAGNOSIS — Z7689 Persons encountering health services in other specified circumstances: Secondary | ICD-10-CM | POA: Diagnosis not present

## 2020-06-22 DIAGNOSIS — R109 Unspecified abdominal pain: Secondary | ICD-10-CM | POA: Diagnosis not present

## 2020-06-22 DIAGNOSIS — Z79899 Other long term (current) drug therapy: Secondary | ICD-10-CM | POA: Insufficient documentation

## 2020-06-22 DIAGNOSIS — M25561 Pain in right knee: Secondary | ICD-10-CM | POA: Diagnosis not present

## 2020-06-22 DIAGNOSIS — K760 Fatty (change of) liver, not elsewhere classified: Secondary | ICD-10-CM | POA: Diagnosis not present

## 2020-06-22 DIAGNOSIS — I517 Cardiomegaly: Secondary | ICD-10-CM | POA: Diagnosis not present

## 2020-06-22 DIAGNOSIS — E119 Type 2 diabetes mellitus without complications: Secondary | ICD-10-CM | POA: Diagnosis not present

## 2020-06-22 LAB — URINALYSIS, ROUTINE W REFLEX MICROSCOPIC
Bilirubin Urine: NEGATIVE
Glucose, UA: NEGATIVE mg/dL
Hgb urine dipstick: NEGATIVE
Ketones, ur: NEGATIVE mg/dL
Leukocytes,Ua: NEGATIVE
Nitrite: NEGATIVE
Protein, ur: NEGATIVE mg/dL
Specific Gravity, Urine: 1.034 — ABNORMAL HIGH (ref 1.005–1.030)
pH: 5 (ref 5.0–8.0)

## 2020-06-22 LAB — COMPREHENSIVE METABOLIC PANEL
ALT: 19 U/L (ref 0–44)
AST: 19 U/L (ref 15–41)
Albumin: 3.7 g/dL (ref 3.5–5.0)
Alkaline Phosphatase: 72 U/L (ref 38–126)
Anion gap: 12 (ref 5–15)
BUN: 12 mg/dL (ref 6–20)
CO2: 32 mmol/L (ref 22–32)
Calcium: 8.5 mg/dL — ABNORMAL LOW (ref 8.9–10.3)
Chloride: 94 mmol/L — ABNORMAL LOW (ref 98–111)
Creatinine, Ser: 0.67 mg/dL (ref 0.44–1.00)
GFR, Estimated: >60 mL/min (ref >60)
Glucose, Bld: 113 mg/dL — ABNORMAL HIGH (ref 70–99)
Potassium: 3.8 mmol/L (ref 3.5–5.1)
Sodium: 138 mmol/L (ref 135–145)
Total Bilirubin: 0.7 mg/dL (ref 0.3–1.2)
Total Protein: 7.2 g/dL (ref 6.5–8.1)

## 2020-06-22 LAB — CBC
HCT: 38 % (ref 36.0–46.0)
Hemoglobin: 11.7 g/dL — ABNORMAL LOW (ref 12.0–15.0)
MCH: 29.3 pg (ref 26.0–34.0)
MCHC: 30.8 g/dL (ref 30.0–36.0)
MCV: 95.2 fL (ref 80.0–100.0)
Platelets: 292 10*3/uL (ref 150–400)
RBC: 3.99 MIL/uL (ref 3.87–5.11)
RDW: 13.9 % (ref 11.5–15.5)
WBC: 18.6 10*3/uL — ABNORMAL HIGH (ref 4.0–10.5)
nRBC: 0 % (ref 0.0–0.2)

## 2020-06-22 LAB — LIPASE, BLOOD: Lipase: 28 U/L (ref 11–51)

## 2020-06-22 LAB — TROPONIN I (HIGH SENSITIVITY): Troponin I (High Sensitivity): 8 ng/L (ref <18)

## 2020-06-22 MED ORDER — MORPHINE SULFATE (PF) 4 MG/ML IV SOLN
4.0000 mg | Freq: Once | INTRAVENOUS | Status: AC
  Administered 2020-06-22: 4 mg via INTRAVENOUS
  Filled 2020-06-22: qty 1

## 2020-06-22 MED ORDER — IOHEXOL 300 MG/ML  SOLN
100.0000 mL | Freq: Once | INTRAMUSCULAR | Status: AC | PRN
  Administered 2020-06-22: 100 mL via INTRAVENOUS

## 2020-06-22 NOTE — ED Notes (Signed)
Pt stated understanding of DC information. Resp even and unlabored. Friend to provide patient home. Ambulatory with steady gait with use of personal cane.

## 2020-06-22 NOTE — ED Triage Notes (Signed)
C/o generalized abd pain x 1 week.  She believes it may be related to abscess on abd 1 month ago.  Also reports intermittent L sided chest pain x 2-3 months.  Denies SOB.  Wears 3 liters O2 at home.  Reports diarrhea.

## 2020-06-22 NOTE — ED Notes (Signed)
Patient transported to CT 

## 2020-06-22 NOTE — Patient Outreach (Signed)
Care Coordination  06/22/2020  Katrinka Herbison May 26, 1960 [id number]   RNCM returned patient's phone call.  Patient currently being evaluated in [facility] ED for abdominal pain, diarrhea, and  left sided chest pain.  Patient states she received 2 shots in her knee this morning and started feeling bad after that.  RNCM provided support to patient. RNCM will follow along.  [name] RN, BSN [place]  [employer] - Managed Medicaid High Risk [phone].

## 2020-06-22 NOTE — ED Provider Notes (Signed)
[facility] EMERGENCY DEPARTMENT Provider Note   CSN: [id number] Arrival date & time: 06/22/20  6389     History Chief Complaint  Patient presents with  . Chest Pain  . Abdominal Pain    Michele Owens is a 60 y.o. female past medical history of COPD, CHF, hypertension, type 2 diabetes, anxiety, presenting to the emergency department with complaint of 1 week of abdominal pain.  Patient states over the last week she has intermittent generalized sharp abdominal pains.  Also states she has been having more frequent bowel movements.  Denies urinary symptoms, nausea, vomiting, fevers.  Has not treated her symptoms with any medications.  Patient also reported in triage chest pain.  On further discussion she states she has had a couple of panic attacks last week and the panic attacks caused some chest pains which resolved when her mood resolved.  This is happened before.  She has not had any recurrence of the chest pain and absence of her anxiety attacks.  No new shortness of breath from baseline.  No cough or fever.  No new leg swelling.  The history is provided by the patient.       Past Medical History:  Diagnosis Date  . Anxiety   . Arthritis   . Asthma   . Chronic diastolic CHF (congestive heart failure) ([place])   . COPD (chronic obstructive pulmonary disease) (HCC)    Uses Oxygen at night  . Depression   . Diabetes mellitus without complication ([place])   . Dyspnea    occ  . Family history of thyroid cancer   . GERD (gastroesophageal reflux disease)   . Gout   . Headache    migraines  . History of DVT of lower extremity   . History of nuclear stress test    Myoview 2/17:  Low risk stress nuclear study with a small, moderate intensity, partially reversible inferior lateral defect consistent with small prior infarct and minimal peri-infarct ischemia; EF 68 with normal wall motion  . Hypertension   . Pneumonia   . Thyroid cancer ([place]) 09/23/2016    Patient  Active Problem List   Diagnosis Date Noted  . Acute on chronic respiratory failure with hypoxia and hypercapnia ([place]) 04/18/2020  . Acute on chronic respiratory failure with hypoxia ([place]) 04/18/2020  . Hemoptysis 04/18/2020  . Acute metabolic encephalopathy 37/34/2876  . Acute encephalopathy 04/18/2020  . Chronic diastolic CHF (congestive heart failure) ([place]) 04/18/2020  . Sepsis ([place]) 04/18/2020  . Morbid obesity with BMI of 50.0-59.9, adult ([place])   . Fall at home, initial encounter 06/23/2018  . HNP (herniated nucleus pulposus), thoracic 06/23/2018  . CHF (congestive heart failure) ([place]) 06/18/2018  . Hypertensive disorder 08/28/2017  . Obesity 08/28/2017  . COPD exacerbation ([place]) 08/04/2017  . Acute and chronic respiratory failure with hypercapnia ([place])   . Pulmonary HTN ([place])   . Genetic testing 02/27/2017  . Family history of thyroid cancer   . Hypomagnesemia 10/03/2016  . Atypical chest pain   . HLD (hyperlipidemia) 09/23/2016  . Gout 09/23/2016  . DVT (deep vein thrombosis) in pregnancy 09/23/2016  . Thyroid cancer ([place]) 09/23/2016  . Hypocalcemia 09/23/2016  . AKI (acute kidney injury) ([place]) 09/23/2016  . Acute pulmonary edema (HCC)   . Acute hypoxemic respiratory failure ([place])   . Ventilator dependent ([place])   . Chronic respiratory failure with hypoxia and hypercapnia ([place]) 07/16/2016  . CAP (community acquired pneumonia) 01/23/2016  . 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  01/23/2016  . Pulmonary hypertension ([place]) 01/23/2016  . Obesity hypoventilation syndrome ([place]) 08/26/2015  . Dyslipidemia associated with type 2 diabetes mellitus ([place]) 04/24/2015  . Leukocytosis 04/24/2015  . Controlled type 2 diabetes mellitus without complication, without long-term current use of insulin ([place]) 04/24/2015  . Anxiety 04/24/2015  . Benign essential HTN 04/24/2015  . Normocytic anemia 04/24/2015  . OSA (obstructive sleep apnea) 05/10/2012  . Tobacco abuse  07/04/2011    Past Surgical History:  Procedure Laterality Date  . BUNIONECTOMY Bilateral   . CARDIAC CATHETERIZATION N/A 06/23/2015   Procedure: Right/Left Heart Cath and Coronary Angiography;  Surgeon: [name], MD;  Location: [facility];  Service: Cardiovascular;  Laterality: N/A;  . COLONOSCOPY WITH PROPOFOL N/A 12/15/2015   Procedure: COLONOSCOPY WITH PROPOFOL;  Surgeon: [name], MD;  Location: [facility];  Service: Endoscopy;  Laterality: N/A;  . RIGHT HEART CATH N/A 10/01/2019   Procedure: RIGHT HEART CATH;  Surgeon: [name], MD;  Location: [facility];  Service: Cardiovascular;  Laterality: N/A;  . THYROIDECTOMY  09/19/2016  . THYROIDECTOMY N/A 09/19/2016   Procedure: TOTAL THYROIDECTOMY;  Surgeon: [name], MD;  Location: [facility];  Service: General;  Laterality: N/A;  . TONSILLECTOMY    . TOTAL ABDOMINAL HYSTERECTOMY  07/14/10     OB History   No obstetric history on file.     Family History  Problem Relation Age of Onset  . Cancer Father        thought to be due to exposure to concrete  . Diabetes Mother   . Thyroid cancer Mother        dx in her 19s-60s  . Cancer Maternal Uncle        2 uncles with cancer NOS  . Brain cancer Paternal Aunt   . Cancer Cousin        maternal first cousin - NOS  . Cancer Cousin        maternal first cousin - NOS    Social History   Tobacco Use  . Smoking status: Former Smoker    Packs/day: 0.50    Years: 41.00    Pack years: 20.50    Types: Cigarettes    Quit date: 07/2016    Years since quitting: 3.9  . Smokeless tobacco: Never Used  . Tobacco comment: Starting to Wean Off -- using Nicotine Patch  Vaping Use  . Vaping Use: Never used  Substance Use Topics  . Alcohol use: Not Currently    Alcohol/week: 0.0 standard drinks    Comment: ? none now  . Drug use: Never    Comment: none for long time    Home Medications Prior to Admission medications   Medication Sig Start Date End Date  Taking? Authorizing Provider  albuterol (PROVENTIL HFA;VENTOLIN HFA) 108 (90 Base) MCG/ACT inhaler Inhale 1-2 puffs into the lungs every 6 (six) hours as needed for wheezing or shortness of breath. 04/23/18   Long, [name], MD  allopurinol (ZYLOPRIM) 100 MG tablet Take 100 mg by mouth daily.    [provider]  ANORO ELLIPTA 62.5-25 MCG/INH AEPB Inhale 1 puff into the lungs daily.  04/11/18   [provider]  benzonatate (TESSALON) 200 MG capsule Take 1 capsule (200 mg total) by mouth in the morning, at noon, and at bedtime. 7 DS 04/13/20   Regalado, Jerald Kief A, MD  Blood Glucose Monitoring Suppl (ACCU-CHEK AVIVA PLUS) w/Device KIT 1 each by Other route daily.  08/07/17  [provider]  calcitRIOL (ROCALTROL) 0.5 MCG capsule Take 1 capsule (0.5 mcg total) by mouth daily. 09/26/16   Hongalgi, [name], MD  carvedilol (COREG) 6.25 MG tablet Take 1 tablet (6.25 mg total) by mouth 2 (two) times daily with a meal. 01/23/18   Bensimhon, [name], MD  clonazePAM (KLONOPIN) 2 MG tablet Take 1 tablet (2 mg total) by mouth 3 (three) times daily. for anxiety 06/23/19   [name], MD  cyclobenzaprine (FLEXERIL) 10 MG tablet Take 10 mg by mouth 3 (three) times daily. 11/19/19   [provider]  diclofenac sodium (VOLTAREN) 1 % GEL Apply 2 g topically 4 (four) times daily as needed (for pain). 06/19/18   [name], MD  EPINEPHrine 0.3 mg/0.3 mL IJ SOAJ injection Inject 0.3 mg into the muscle daily as needed (allergic reaction).    [provider]  FEROSUL 325 (65 Fe) MG tablet Take 325 mg by mouth daily. 03/22/17   [provider]  glucose blood test strip Use as instructed to check blood sugar up to 4 times daily Patient taking differently: 1 each by Other route See admin instructions. Use as instructed to check blood sugar up to 4 times daily 08/06/17   [name], DO  guaiFENesin (MUCINEX) 600 MG 12 hr tablet Take 2 tablets (1,200 mg total) by mouth 2  (two) times daily. 04/13/20   Regalado, Belkys A, MD  ipratropium-albuterol (DUONEB) 0.5-2.5 (3) MG/3ML SOLN Take 3 mLs by nebulization 4 (four) times daily.  04/05/17   [provider]  levothyroxine (SYNTHROID, LEVOTHROID) 112 MCG tablet Take 224 mcg by mouth daily. 07/11/17   [provider]  lisinopril (PRINIVIL,ZESTRIL) 5 MG tablet Take 1 tablet (5 mg total) by mouth daily. Please call for office visit [phone] 08/15/17   [name], MD  loperamide (IMODIUM) 2 MG capsule Take 1 capsule (2 mg total) by mouth 4 (four) times daily as needed for diarrhea or loose stools. 07/06/49   [name], MD  metFORMIN (GLUCOPHAGE) 500 MG tablet Take 1 tablet (500 mg total) by mouth 2 (two) times daily with a meal. 12/11/14   [name], MD  nitroGLYCERIN (NITROSTAT) 0.4 MG SL tablet Place 1 tablet (0.4 mg total) under the tongue every 5 (five) minutes as needed for chest pain. 06/19/18   [name], MD  nystatin cream (MYCOSTATIN) Apply 1 application topically 2 (two) times daily as needed for dry skin. 04/13/20   Regalado, Belkys A, MD  omeprazole (PRILOSEC) 20 MG capsule Take 20 mg by mouth 2 (two) times daily before a meal. 11/19/19   [provider]  oxyCODONE-acetaminophen (PERCOCET) 10-325 MG tablet Take 1 tablet by mouth every 6 (six) hours as needed for pain. 12/03/19   [provider]  OXYGEN Inhale 2-3 L into the lungs continuous.    [provider]  potassium chloride SA (K-DUR,KLOR-CON) 20 MEQ tablet Take 1 tablet (20 mEq total) by mouth 2 (two) times daily. 07/24/18   [name], MD  QC STOOL SOFTENER PLS LAXATIVE 8.6-50 MG tablet Take 1 tablet by mouth at bedtime. 04/11/17   [provider]  rosuvastatin (CRESTOR) 10 MG tablet Take 10 mg by mouth every evening. 11/19/19   [provider]  torsemide (DEMADEX) 20 MG tablet TAKE 4 (FOUR) TABLETS BY MOUTH DAILY (2AM+ 2NOON) 05/14/20   [name], MD  traZODone (DESYREL)  50 MG tablet Take 100 mg by mouth at bedtime. 11/19/19  [provider]    Allergies    Bee venom, Fioricet [butalbital-apap-caffeine], Ibuprofen, Lamisil [terbinafine], and Nsaids  Review of Systems   Review of Systems  Gastrointestinal: Positive for abdominal pain.  Psychiatric/Behavioral: The patient is nervous/anxious.   All other systems reviewed and are negative.   Physical Exam Updated Vital Signs BP (!) 100/47   Pulse 89   Temp 98.5 F (36.9 C)   Resp 19   SpO2 97%   Physical Exam Vitals and nursing note reviewed.  Constitutional:      General: She is not in acute distress.    Appearance: She is well-developed. She is obese. She is not ill-appearing.  HENT:     Head: Normocephalic and atraumatic.  Eyes:     Conjunctiva/sclera: Conjunctivae normal.  Cardiovascular:     Rate and Rhythm: Normal rate and regular rhythm.  Pulmonary:     Effort: Pulmonary effort is normal. No respiratory distress.  Abdominal:     General: Bowel sounds are normal.     Palpations: Abdomen is soft.     Tenderness: There is abdominal tenderness (generalized). There is no guarding or rebound.  Skin:    General: Skin is warm.  Neurological:     Mental Status: She is alert.  Psychiatric:        Behavior: Behavior normal.     ED Results / Procedures / Treatments   Labs (all labs ordered are listed, but only abnormal results are displayed) Labs Reviewed  COMPREHENSIVE METABOLIC PANEL - Abnormal; Notable for the following components:      Result Value   Chloride 94 (*)    Glucose, Bld 113 (*)    Calcium 8.5 (*)    All other components within normal limits  CBC - Abnormal; Notable for the following components:   WBC 18.6 (*)    Hemoglobin 11.7 (*)    All other components within normal limits  URINALYSIS, ROUTINE W REFLEX MICROSCOPIC - Abnormal; Notable for the following components:   Specific Gravity, Urine 1.034 (*)    All other components within normal limits   LIPASE, BLOOD  TROPONIN I (HIGH SENSITIVITY)    EKG EKG Interpretation  Date/Time:  Tuesday June 22 2020 09:47:28 EDT Ventricular Rate:  95 PR Interval:  156 QRS Duration: 72 QT Interval:  364 QTC Calculation: 457 R Axis:   40 Text Interpretation: Normal sinus rhythm Cannot rule out Anterior infarct , age undetermined Abnormal ECG No significant change since prior 2/22 Confirmed by Aletta Edouard [phone]) on 06/22/2020 10:11:28 AM Also confirmed by Aletta Edouard [phone]), editor Hattie Perch ([id number])  on 06/22/2020 10:16:35 AM   Radiology DG Chest 2 View  Result Date: 06/22/2020 CLINICAL DATA:  Left chest pain for 2-3 months. EXAM: CHEST - 2 VIEW COMPARISON:  Single-view of the chest 04/18/2020 04/10/2020. FINDINGS: Mild subsegmental atelectasis is seen in the lung bases. Lung volumes are low. No pneumothorax or pleural effusion. There is cardiomegaly. Aortic atherosclerosis noted. No pneumothorax or pleural fluid is identified. The exam is limited due to portable technique and the patient's habitus. IMPRESSION: Subsegmental bibasilar atelectasis in a low volume chest. Cardiomegaly. Aortic Atherosclerosis (ICD10-I70.0). Electronically Signed   By: Inge Rise M.D.   On: 06/22/2020 10:41   CT Abdomen Pelvis W Contrast  Result Date: 06/22/2020 CLINICAL DATA:  Generalized abdominal pain for 1 week EXAM: CT ABDOMEN AND PELVIS WITH CONTRAST TECHNIQUE: Multidetector CT imaging of the abdomen and pelvis was performed using the standard protocol following bolus administration of  intravenous contrast. CONTRAST:  164m OMNIPAQUE IOHEXOL 300 MG/ML  SOLN COMPARISON:  04/10/2020 FINDINGS: Lower chest: Bibasilar atelectasis.  Heart size is mildly enlarged. Hepatobiliary: Decreased attenuation of the hepatic parenchyma. No focal hepatic lesion is identified. Unremarkable gallbladder. No hyperdense gallstone. No biliary dilatation. Pancreas: Unremarkable. No pancreatic ductal dilatation or  surrounding inflammatory changes. Spleen: Normal in size without focal abnormality. Adrenals/Urinary Tract: Unremarkable adrenal glands. Kidneys enhance symmetrically without focal lesion, stone, or hydronephrosis. Ureters are nondilated. Urinary bladder appears unremarkable. Stomach/Bowel: Stomach is within normal limits. Appendix appears normal (series 3, image 59). No evidence of bowel wall thickening, distention, or inflammatory changes. Vascular/Lymphatic: Minimal scattered aortoiliac atherosclerotic calcifications without aneurysm. No abdominopelvic lymphadenopathy. Reproductive: Status post hysterectomy. No adnexal masses. Other: No Glady Ouderkirk fluid. No abdominopelvic fluid collection. No pneumoperitoneum. No abdominal wall hernia. Musculoskeletal: No acute or significant osseous findings. IMPRESSION: 1. No acute abdominopelvic findings. 2. Hepatic steatosis. 3. Aortic atherosclerosis (ICD10-I70.0). Electronically Signed   By: NDavina PokeD.O.   On: 06/22/2020 12:14    Procedures Procedures   Medications Ordered in ED Medications  morphine 4 MG/ML injection 4 mg (4 mg Intravenous Given 06/22/20 1052)  iohexol (OMNIPAQUE) 300 MG/ML solution 100 mL (100 mLs Intravenous Contrast Given 06/22/20 1203)    ED Course  I have reviewed the triage vital signs and the nursing notes.  Pertinent labs & imaging results that were available during my care of the patient were reviewed by me and considered in my medical decision making (see chart for details).    MDM Rules/Calculators/A&P                          Patient presenting with generalized abdominal pains over the last week, intermittent.  Increased frequency of bowel movements.  No fever or vomiting.  On exam she is in no acute distress.  Abdomen is generally tender without peritoneal signs.   Regarding patient's chest pain reported in triage, she states she has had a couple of panic attacks due to stress at home and during the panic attack she  began feeling some chest discomfort which resolved when her panic attack resolved.  No recurrence of symptoms or other associated concerning symptoms.  Troponin is negative.  EKG is unchanged from previous.  Chest x-ray without pneumonia or other acute findings.  Low suspicion for cardiac etiology of the chest pain.  CBC is noted to have leukocytosis though this appears to be chronic in nature. Lipase is within normal limits, metabolic panel without acute significant change from baseline.  UA is also negative for infection.  CT scan of the abdomen pelvis was done and is negative for emergent or surgical intra-abdominal pathology.  Recommend symptomatic management for patient's abdominal pains.  Patient is instructed to follow with PCP regarding her visit today.  She appears comfortable with discharge and is discharged in no acute distress.  Discussed results, findings, treatment and follow up. Patient advised of return precautions. Patient verbalized understanding and agreed with plan.  Final Clinical Impression(s) / ED Diagnoses Final diagnoses:  Generalized abdominal pain    Rx / DC Orders ED Discharge Orders    None       Robinson, JMartiniqueN, PA-C 06/22/20 1436    BHayden Rasmussen MD 06/22/20 1757

## 2020-06-22 NOTE — Discharge Instructions (Addendum)
Please follow with your primary care provider regarding your visit today. Your CT scan, chest x-ray, blood work and EKG are reassuring.

## 2020-06-22 NOTE — ED Notes (Signed)
Placed grip socks on patient per request. Pt ambulated to bathroom with no assistance using personal cane to provide urine sample. Pt placed back in bed with home O2 in place. Resp even and unlabored. VSS. Call bell within reach.

## 2020-06-24 ENCOUNTER — Other Ambulatory Visit: Payer: Self-pay

## 2020-06-24 NOTE — Patient Outreach (Signed)
Medicaid Managed Care Social Work Note  06/24/2020 Name:  Michele Owens MRN:  [id number] DOB:  1960/10/22  Michele Owens is an 60 y.o. year old female who is a primary patient of [name], [name], MD.  The Medicaid Managed Care Coordination team was consulted for assistance with:  Community Resources   Michele Owens was given information about Medicaid Managed CareCoordination services today. [name] agreed to services and verbal consent obtained.  Engaged with patient  for by telephone forfollow up visit in response to referral for case management and/or care coordination services.   Assessments/Interventions:  Review of past medical history, allergies, medications, health status, including review of consultants reports, laboratory and other test data, was performed as part of comprehensive evaluation and provision of chronic care management services.  SDOH: (Social Determinant of Health) assessments and interventions performed:   BSW contacted patient to follow up on her moving to [facility], she stated she is still saving money. Patient did cut call short because she was sleeping, patient stated she was not in need of any resources at this time.  Advanced Directives Status:  Not addressed in this encounter.  Care Plan                 Allergies  Allergen Reactions  . Bee Venom Swelling and Other (See Comments)    "All over my body" (swelling)  . Fioricet [Butalbital-Apap-Caffeine] Nausea And Vomiting and Rash  . Ibuprofen Rash and Other (See Comments)    Severe rash  . Lamisil [Terbinafine] Rash and Other (See Comments)    Pt states this causes her to "feel funny"  . Nsaids Other (See Comments)    Per MD's orders     Medications Reviewed Today    Reviewed by [name], RN (Registered Nurse) on 06/22/20 at 1348  Med List Status: <None>  Medication Order Taking? Sig Documenting Provider Last Dose Status Informant  albuterol (PROVENTIL HFA;VENTOLIN HFA) 108 (90 Base)  MCG/ACT inhaler [id number] No Inhale 1-2 puffs into the lungs every 6 (six) hours as needed for wheezing or shortness of breath. Long, [name], MD unk Active Multiple Informants  allopurinol (ZYLOPRIM) 100 MG tablet [id number] No Take 100 mg by mouth daily. [provider] 04/17/2020 Active Multiple Informants           Med Note [name], [name]   Tue Oct 23, 2017  2:58 AM)    [name] ELLIPTA 62.5-25 MCG/INH AEPB [id number] No Inhale 1 puff into the lungs daily.  [provider] 04/18/2020 Unknown time Active Multiple Informants  benzonatate (TESSALON) 200 MG capsule [id number] No Take 1 capsule (200 mg total) by mouth in the morning, at noon, and at bedtime. 7 DS Niel Hummer A, MD 04/16/2020 Active Multiple Informants  Blood Glucose Monitoring Suppl (ACCU-CHEK AVIVA PLUS) w/Device KIT [id number] No 1 each by Other route daily.  [provider] unk unk Active Multiple Informants  calcitRIOL (ROCALTROL) 0.5 MCG capsule [id number] No Take 1 capsule (0.5 mcg total) by mouth daily. Modena Jansky, MD 04/17/2020 Active Multiple Informants           Med Note [name], [name]   Tue Oct 23, 2017  2:59 AM)    carvedilol (COREG) 6.25 MG tablet [id number] No Take 1 tablet (6.25 mg total) by mouth 2 (two) times daily with a meal. Bensimhon, [name], MD 04/17/2020 1700 Active Multiple Informants  clonazePAM (KLONOPIN) 2 MG tablet [id number] No Take 1 tablet (2 mg total) by mouth  3 (three) times daily. for anxiety [name], MD 04/18/2020 Unknown time Active Multiple Informants  cyclobenzaprine (FLEXERIL) 10 MG tablet [id number] No Take 10 mg by mouth 3 (three) times daily. [provider] 04/17/2020 Active Multiple Informants  diclofenac sodium (VOLTAREN) 1 % GEL [id number] No Apply 2 g topically 4 (four) times daily as needed (for pain). [name], MD Past Week Unknown time Active Multiple Informants  EPINEPHrine 0.3 mg/0.3 mL IJ SOAJ injection [id number] No Inject 0.3 mg into the  muscle daily as needed (allergic reaction). [provider] unk Active Multiple Informants  FEROSUL 325 (65 Fe) MG tablet [id number] No Take 325 mg by mouth daily. [provider] 04/17/2020 Active Multiple Informants           Med Note [name], [name]   Tue Oct 23, 2017  2:57 AM)    glucose blood test strip [id number] No Use as instructed to check blood sugar up to 4 times daily  Patient taking differently: 1 each by Other route See admin instructions. Use as instructed to check blood sugar up to 4 times daily   [name], DO unk unk Active Multiple Informants  guaiFENesin (MUCINEX) 600 MG 12 hr tablet [id number] No Take 2 tablets (1,200 mg total) by mouth 2 (two) times daily. Regalado, Belkys A, MD unk Active Multiple Informants  ipratropium-albuterol (DUONEB) 0.5-2.5 (3) MG/3ML SOLN [id number] No Take 3 mLs by nebulization 4 (four) times daily.  [provider] unk Active Multiple Informants           Med Note [name], [name]   Tue Oct 23, 2017  2:57 AM)    levothyroxine (SYNTHROID, LEVOTHROID) 112 MCG tablet [id number] No Take 224 mcg by mouth daily. [provider] 04/18/2020 Unknown time Active Multiple Informants           Med Note [name], [name]   Tue Oct 23, 2017  2:57 AM)    lisinopril (PRINIVIL,ZESTRIL) 5 MG tablet [id number] No Take 1 tablet (5 mg total) by mouth daily. Please call for office visit [phone] [name], MD 04/18/2020 Unknown time Active Multiple Informants           Med Note [name], [name]   Tue Oct 23, 2017  2:58 AM)    loperamide (IMODIUM) 2 MG capsule [id number] No Take 1 capsule (2 mg total) by mouth 4 (four) times daily as needed for diarrhea or loose stools. [name], MD unk Active Multiple Informants  metFORMIN (GLUCOPHAGE) 500 MG tablet [id number] No Take 1 tablet (500 mg total) by mouth 2 (two) times daily with a meal. [name], MD 04/17/2020 Active Multiple Informants           Med Note [name],  [name]   Tue Oct 23, 2017  2:58 AM)    nitroGLYCERIN (NITROSTAT) 0.4 MG SL tablet [id number] No Place 1 tablet (0.4 mg total) under the tongue every 5 (five) minutes as needed for chest pain. [name], MD unk Active Multiple Informants  nystatin cream (MYCOSTATIN) [id number] No Apply 1 application topically 2 (two) times daily as needed for dry skin. Regalado, Belkys A, MD 04/16/2020 Active Multiple Informants  omeprazole (PRILOSEC) 20 MG capsule [id number] No Take 20 mg by mouth 2 (two) times daily before a meal. [provider] 04/17/2020 Active Multiple Informants  oxyCODONE-acetaminophen (PERCOCET) 10-325 MG tablet [id number] No Take 1 tablet by mouth every 6 (six) hours as needed for pain. [provider] 04/17/2020 Active Multiple  Informants           Med Note Corky Mull   Mon Apr 12, 2020  1:22 PM) LF on 03-25-20 # 120 DS 30  OXYGEN [id number] No Inhale 2-3 L into the lungs continuous. [provider] 01/14/2020 Unknown time Active Multiple Informants           Med Note Corky Mull   Mon Apr 12, 2020 10:08 AM)    potassium chloride SA (K-DUR,KLOR-CON) 20 MEQ tablet [id number] No Take 1 tablet (20 mEq total) by mouth 2 (two) times daily. [name], MD 04/17/2020 Active Multiple Informants  QC STOOL SOFTENER PLS LAXATIVE 8.6-50 MG tablet [id number] No Take 1 tablet by mouth at bedtime. [provider] 04/17/2020 Active Multiple Informants           Med Note [name], [name]   Tue Oct 23, 2017  2:56 AM)    rosuvastatin (CRESTOR) 10 MG tablet [id number] No Take 10 mg by mouth every evening. [provider] 04/16/2020 Active Multiple Informants  torsemide (DEMADEX) 20 MG tablet [id number]  TAKE 4 (FOUR) TABLETS BY MOUTH DAILY (2AM+ 2NOON) [name], MD  Active   traZODone (DESYREL) 50 MG tablet [id number] No Take 100 mg by mouth at bedtime. [provider] 04/17/2020 Active Multiple Informants          Patient Active  Problem List   Diagnosis Date Noted  . Acute on chronic respiratory failure with hypoxia and hypercapnia ([place]) 04/18/2020  . Acute on chronic respiratory failure with hypoxia ([place]) 04/18/2020  . Hemoptysis 04/18/2020  . Acute metabolic encephalopathy 18/33/5825  . Acute encephalopathy 04/18/2020  . Chronic diastolic CHF (congestive heart failure) ([place]) 04/18/2020  . Sepsis ([place]) 04/18/2020  . Morbid obesity with BMI of 50.0-59.9, adult ([place])   . Fall at home, initial encounter 06/23/2018  . HNP (herniated nucleus pulposus), thoracic 06/23/2018  . CHF (congestive heart failure) ([place]) 06/18/2018  . Hypertensive disorder 08/28/2017  . Obesity 08/28/2017  . COPD exacerbation ([place]) 08/04/2017  . Acute and chronic respiratory failure with hypercapnia ([place])   . Pulmonary HTN ([place])   . Genetic testing 02/27/2017  . Family history of thyroid cancer   . Hypomagnesemia 10/03/2016  . Atypical chest pain   . HLD (hyperlipidemia) 09/23/2016  . Gout 09/23/2016  . DVT (deep vein thrombosis) in pregnancy 09/23/2016  . Thyroid cancer ([place]) 09/23/2016  . Hypocalcemia 09/23/2016  . AKI (acute kidney injury) ([place]) 09/23/2016  . Acute pulmonary edema (H[facility])   . Acute hypoxemic respiratory failure ([place])   . Ventilator dependent ([place])   . Chronic respiratory failure with hypoxia and hypercapnia ([place]) 07/16/2016  . CAP (community acquired pneumonia) 01/23/2016  . 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 Pulmonary hypertension ([place]) 01/23/2016  . Obesity hypoventilation syndrome ([place]) 08/26/2015  . Dyslipidemia associated with type 2 diabetes mellitus ([place]) 04/24/2015  . Leukocytosis 04/24/2015  . Controlled type 2 diabetes mellitus without complication, without long-term current use of insulin ([place]) 04/24/2015  . Anxiety 04/24/2015  . Benign essential HTN 04/24/2015  . Normocytic anemia 04/24/2015  . OSA (obstructive sleep apnea) 05/10/2012  . Tobacco abuse 07/04/2011     Conditions to be addressed/monitored per PCP order:  Housing  Care Plan : General Plan of Care (Adult)  Updates made by Ethelda Chick since 06/24/2020 12:00 AM    Problem: Barriers to Treatment     Long-Range Goal: Barriers to Treatment Identified and Managed  Start Date: 05/03/2020  Expected End Date: 08/01/2020  Recent Progress: Not on track  Priority: High  Note:   CARE PLAN ENTRY Medicaid Managed Care (see longitudinal plan of care for additional care plan information)  Current Barriers:  . Transportation barriers-patient utilizing [employer] transportation as needed. . Limited access to caregiver-patient needs PCS, currently not receiving services she is expecting.  Patient states she is now receiving PCS.  Nurse Clinical Goal(s):  Marland Kitchen Over the next 30 days, patient will work with [employer] to address needs related to transportation. Marland Kitchen Update 05/12/20:  Patient has used [employer] and is very pleased. . Over the next 90 days, patient will attend all scheduled medical appointments. Marland Kitchen RNCM will make referral to SW for assistance with PCS . Update 05/20/20:  Patient states she is now receiving PCS from [place] 3 days a week for 4 hours each day. Marland Kitchen Update 05/27/20: BSW spoke with patient about PCS, she stated someone usually comes in 3 days a week for a few hours. She stated her aide was off today. BSW contacted [place] and spoke with [facility], she stated that patient has 13 hours per week with 6 days a week, however patient chooses to have an aide come out 3 days a week so that she can have more hours. The aide assist with most ADL's. Marland Kitchen Update 06/04/20: Patient stated she is ready to move to [facility]. She has already started the process but wants to save some money first and she has to wait on her nephew who will drive the uhaul truck. Marland Kitchen Update 06/24/20: BSW contacted patient to follow up on her moving to [facility], she  stated she is still saving money. Patient did cut call short because she was sleeping, patient stated she was not in need of any resources at this time.  Interventions: . Inter-disciplinary care team collaboration (see longitudinal plan of care) . Referred patient to community resources care guide team for assistance with transportation. . Provided patient with information about [employer]. . Discussed plans with patient for ongoing care management follow up and provided patient with direct contact information for care management team . Advised patient to call [place]. Marland Kitchen Collaborated with [facility] regarding transportation. . Assisted patient/caregiver with obtaining information about health plan benefits . Collaborated with Social Work regarding [employer]. Marland Kitchen Update 06/14/20:  Patient continues to utilize [employer], aide, [employer] as needed-has missed appointments due to transportation "no shows"-will continue to follow.  Patient Self Care Activities:  . Patient will call [employer]. Marland Kitchen Update 06/07/20:  Patient continues to utilize [facility] with success. Marland Kitchen Update 06/14/20:  Patient has missed appointments due to transportation no shows.  [facility] transportation has been utilized as needed.        Follow up:  Patient agrees to Care Plan and Follow-up.  Plan: The Managed Medicaid care management team will reach out to the patient again over the next 30 days.  Date/time of next scheduled Social Work care management/care coordination outreach:  07/26/20  Mickel Fuchs, [place], [place]  High Risk Managed Medicaid Team

## 2020-06-24 NOTE — Patient Instructions (Signed)
Visit Information  Ms. Lippold was given information about Medicaid Managed Care team care coordination services as a part of their [facility] Medicaid benefit. [name] verbally consented to engagement with the [facility] Managed Care team.   For questions related to your [facility] health plan, please call: [phone]  If you would like to schedule transportation through your [facility] plan, please call the following number at least 2 days in advance of your appointment: [phone]   Ms. Brooks Sailors - following are the goals we discussed in your visit today:  Goals Addressed   None      Social Worker will follow up in 30 days.   Mickel Fuchs, BSW, [place]  High Risk Managed Medicaid Team    Following is a copy of your plan of care:

## 2020-06-27 IMAGING — DX DG CHEST 2V
2 series · 2 of 2 positions shown · non-contrast
Comparison: Chest radiograph October 01, 2017

CLINICAL DATA: Shortness of breath.  History of COPD.

EXAM:
CHEST - 2 VIEW

[x chest ap]
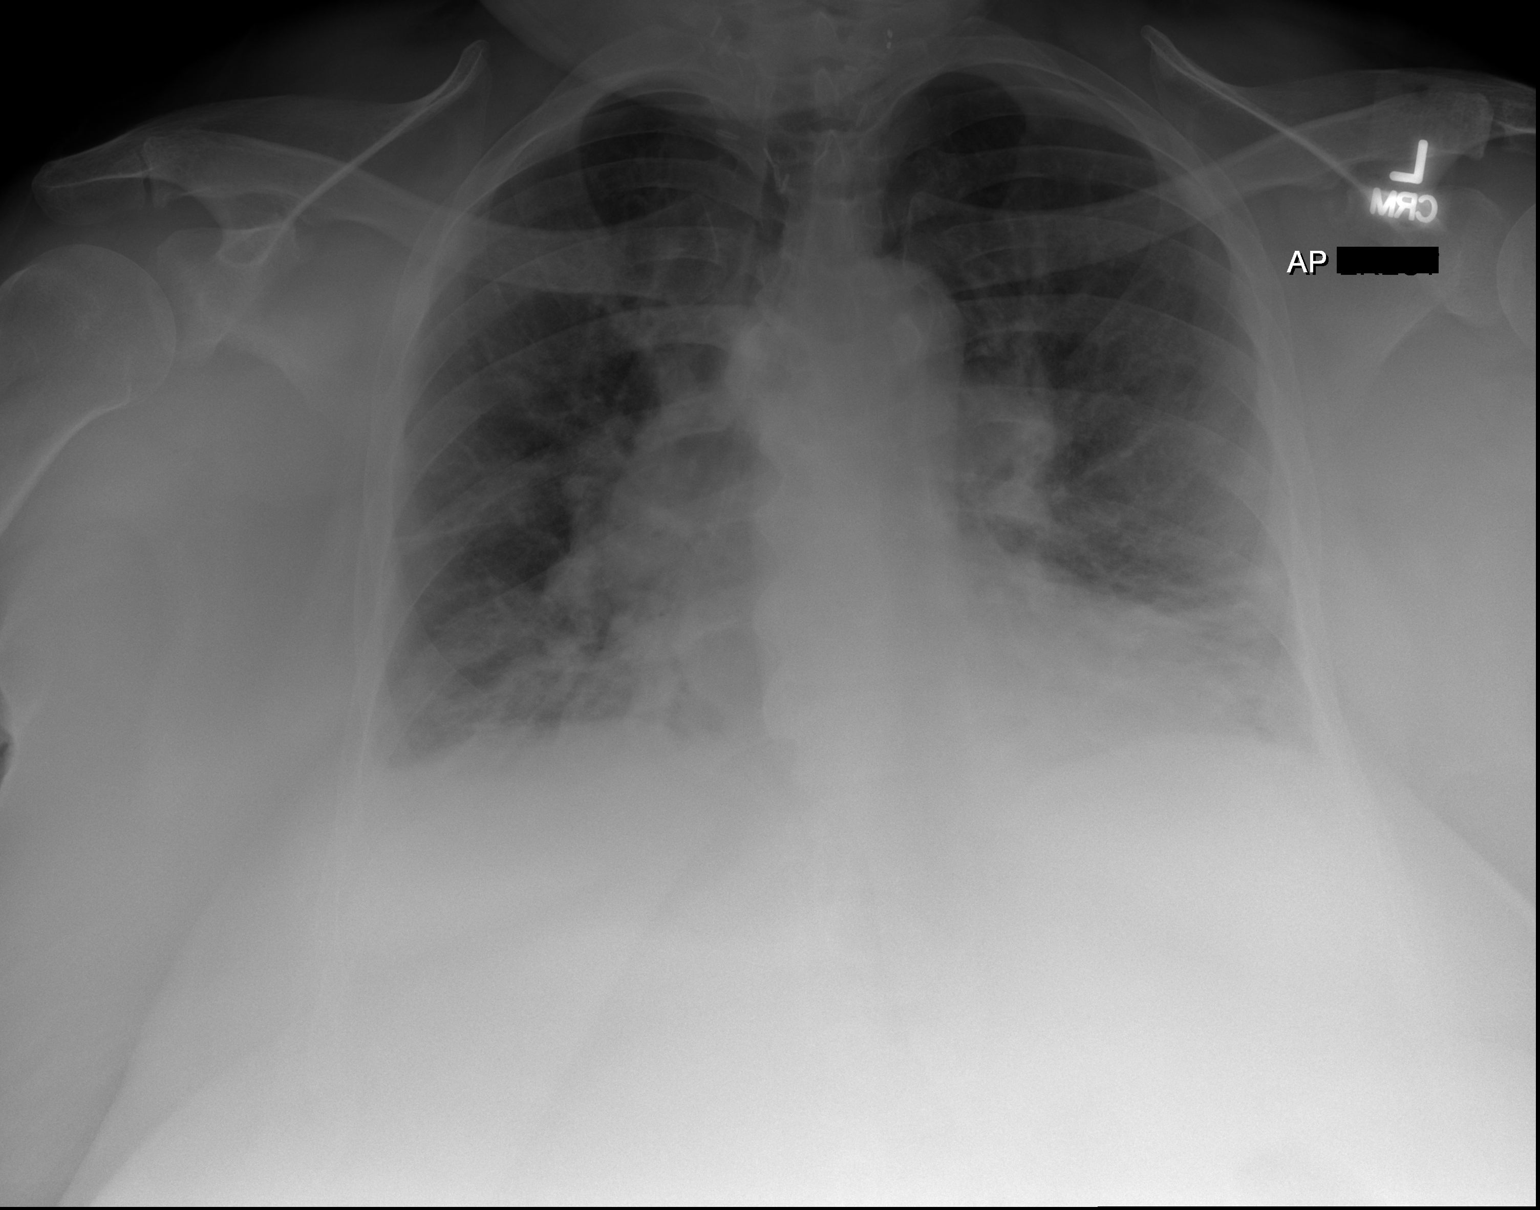

[w chest lat]
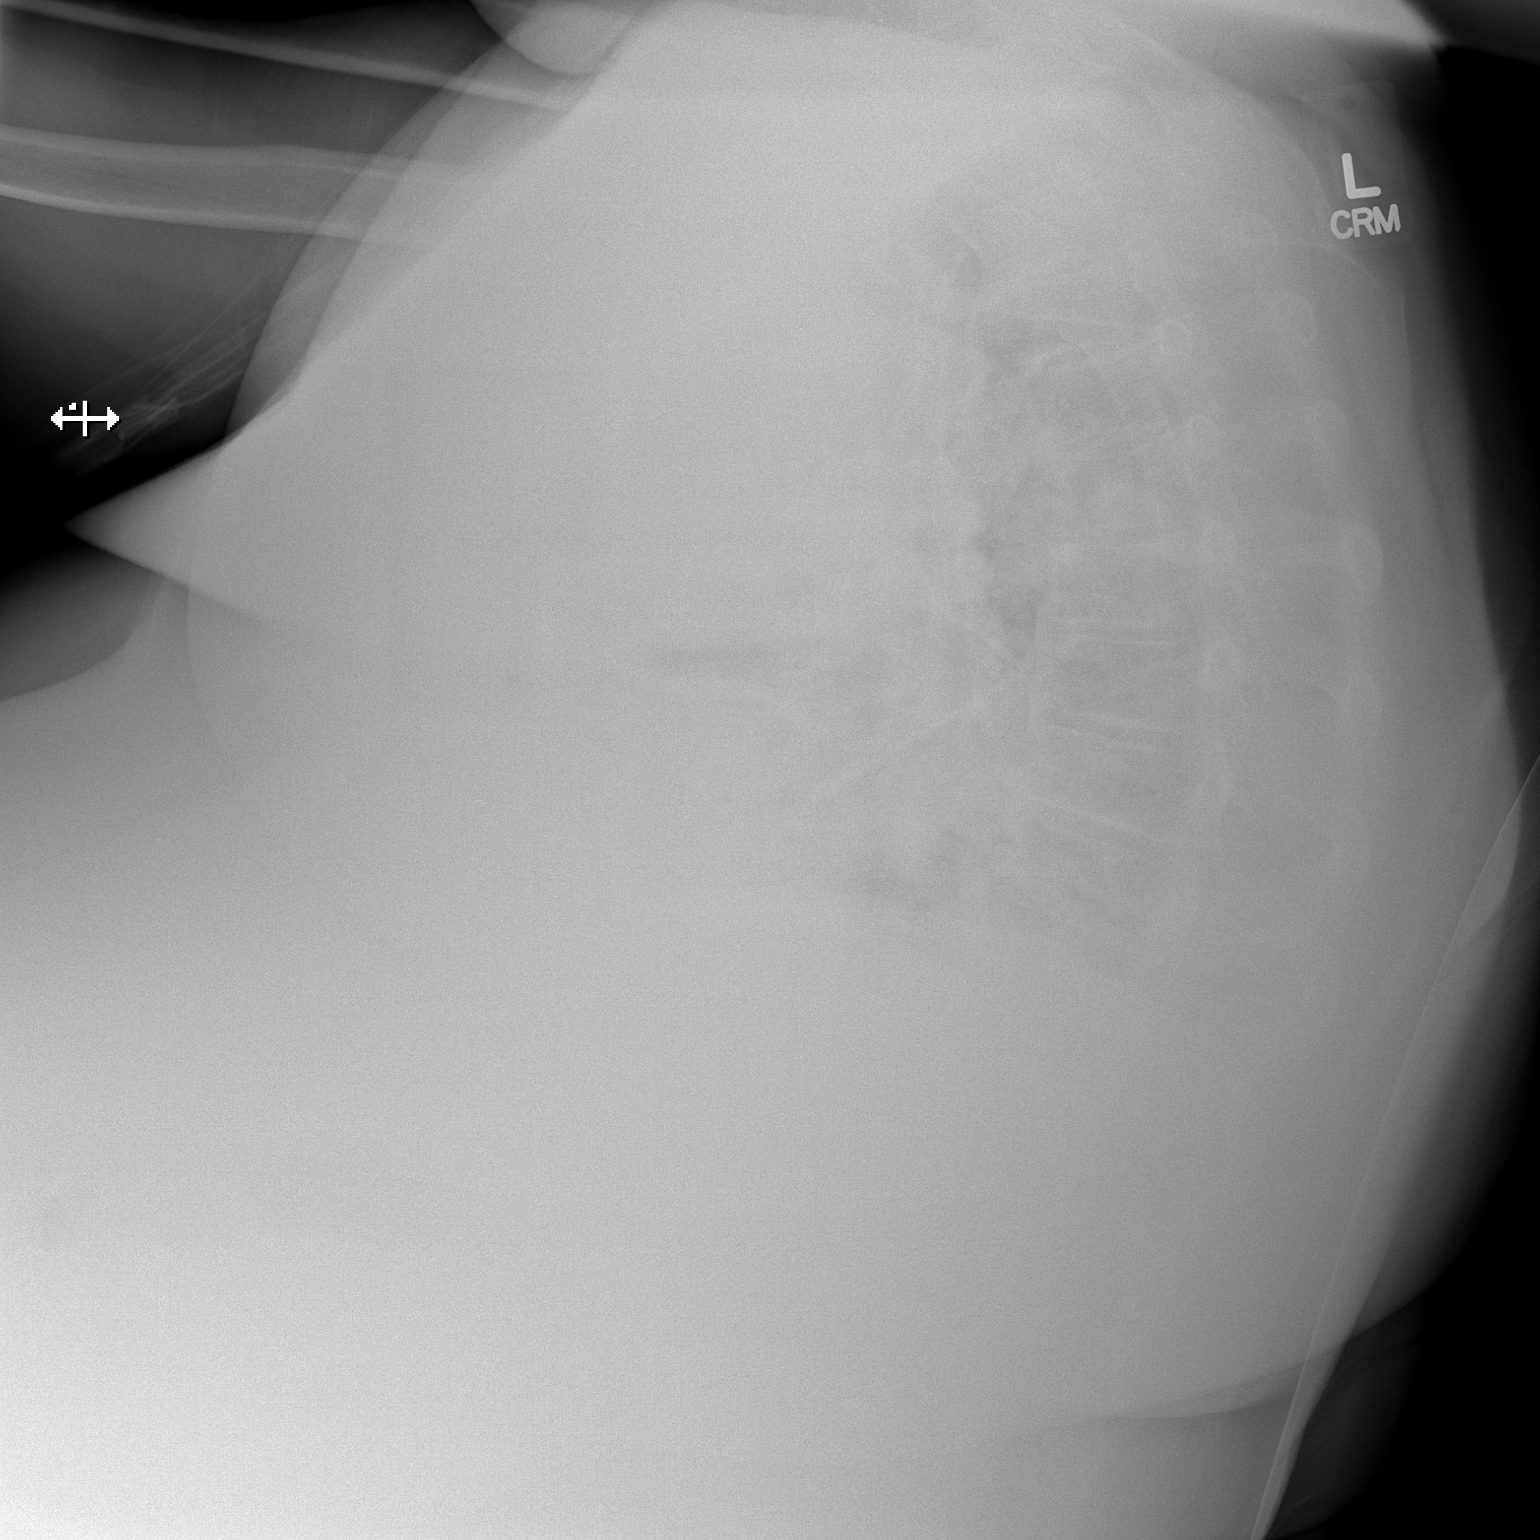

[2 of 2 positions shown; findings below may reference images not displayed]

FINDINGS: Cardiac silhouette is similarly enlarged. Calcified aortic arch.
Pulmonary vascular congestion. LEFT greater than RIGHT mid and lower
lung zone strandy densities. Blunting the RIGHT costophrenic angle.
No pneumothorax. Surgical clips at thoracic inlet most compatible
with thyroidectomy. Large body habitus.
IMPRESSION: Stable cardiomegaly and pulmonary vascular congestion.

Similar atelectasis/scarring. Small suspected RIGHT pleural
effusion.

Aortic Atherosclerosis ([id number]-AJ8.8).

## 2020-06-28 ENCOUNTER — Other Ambulatory Visit: Payer: Self-pay | Admitting: Obstetrics and Gynecology

## 2020-06-28 ENCOUNTER — Other Ambulatory Visit: Payer: Self-pay

## 2020-06-28 DIAGNOSIS — M79675 Pain in left toe(s): Secondary | ICD-10-CM | POA: Diagnosis not present

## 2020-06-28 DIAGNOSIS — M722 Plantar fascial fibromatosis: Secondary | ICD-10-CM | POA: Diagnosis not present

## 2020-06-28 DIAGNOSIS — M79674 Pain in right toe(s): Secondary | ICD-10-CM | POA: Diagnosis not present

## 2020-06-28 DIAGNOSIS — B351 Tinea unguium: Secondary | ICD-10-CM | POA: Diagnosis not present

## 2020-06-28 MED ORDER — BETAMETHASONE SOD PHOS & ACET 6 (3-3) MG/ML IJ SUSP
3.0000 mg | Freq: Once | INTRAMUSCULAR | Status: AC
Start: 2020-06-28 — End: 2020-06-28
  Administered 2020-06-28: 3 mg via INTRA_ARTICULAR

## 2020-06-28 NOTE — Patient Outreach (Signed)
Medicaid Managed Care    Pharmacy Note  06/28/2020 Name: Michele Owens MRN: [id number] DOB: [birth year]  Michele Owens is a 60 y.o. year old female who is a primary care patient of [name], [name], MD. The [facility] Managed Care Coordination team was consulted for assistance with disease management and care coordination needs.    Engaged with patient Engaged with patient by telephone for follow up visit in response to referral for case management and/or care coordination services.  Michele Owens was given information about Managed Medicaid Care Coordination team services today. [name] agreed to services and verbal consent obtained.   Objective:  Lab Results  Component Value Date   CREATININE 0.67 06/22/2020   CREATININE 0.65 05/24/2020   CREATININE 0.61 04/20/2020    Lab Results  Component Value Date   HGBA1C 6.2 (H) 04/19/2020       Component Value Date/Time   CHOL 226 (H) 08/05/2017 0318   TRIG 59 08/05/2017 0318   HDL 80 08/05/2017 0318   CHOLHDL 2.8 08/05/2017 0318   VLDL 12 08/05/2017 0318   LDLCALC 134 (H) 08/05/2017 0318    Other: (TSH, CBC, Vit D, etc.)  Clinical ASCVD: No  The 10-year ASCVD risk score [name], et al., 2013) is: 9.2%   Values used to calculate the score:     Age: 60 years     Sex: Female     Is Non-Hispanic African American: Yes     Diabetic: Yes     Tobacco smoker: No     Systolic Blood Pressure: 510 mmHg     Is BP treated: Yes     HDL Cholesterol: 80 mg/dL     Total Cholesterol: 226 mg/dL    Other: (CHADS2VASc if Afib, PHQ9 if depression, MMRC or CAT for COPD, ACT, DEXA)  BP Readings from Last 3 Encounters:  06/22/20 110/67  05/25/20 131/74  05/24/20 (!) 123/55    Assessment/Interventions: Review of patient past medical history, allergies, medications, health status, including review of consultants reports, laboratory and other test data, was performed as part of comprehensive evaluation and provision of chronic care  management services.    HTN Carvedilol 6.61m BID Lisinopril 564mQD Plan: At goal,  patient stable/ symptoms controlled   Edema Torsemide 2021m2AM and 2Noon) KCl Plan: At goal,  patient stable/ symptoms controlled   DM Metformin 500m7mBID Plan: At goal,  patient stable/ symptoms controlled   Lipids Rosuvastatin 10mg29mn: At goal,  patient stable/ symptoms controlled   Pain Left Foot:  Pain Score: 8/10 Oxycodone 10/325 Cyclobenzaprine March 2022: Missed PT appointment and now can't go back (Per patient).  Insomnia Trazodone 100mg 76mient not sleeping due to stress of her son passing Plan: At goal,  patient stable/ symptoms controlled    Gout Allopurinol 100mg -27mes has gout attack daily but then described OA pain and how her bones rub against each other in her knees and hips March 2022: Ask for Uric Acid Labs  Thyroid Levothyroxine 112mcg 274mlan: At goal,  patient stable/ symptoms controlled   SDOH (Social Determinants of Health) assessments and interventions performed:    Care Plan  Allergies  Allergen Reactions  . Bee Venom Swelling and Other (See Comments)    "All over my body" (swelling)  . Fioricet [Butalbital-Apap-Caffeine] Nausea And Vomiting and Rash  . Ibuprofen Rash and Other (See Comments)    Severe rash  . Lamisil [Terbinafine] Rash and Other (See Comments)    Pt states this  causes her to "feel funny"  . Nsaids Other (See Comments)    Per MD's orders     Medications Reviewed Today    Reviewed by [name], RN (Registered Nurse) on 06/22/20 at 1348  Med List Status: <None>  Medication Order Taking? Sig Documenting Provider Last Dose Status Informant  albuterol (PROVENTIL HFA;VENTOLIN HFA) 108 (90 Base) MCG/ACT inhaler [id number] No Inhale 1-2 puffs into the lungs every 6 (six) hours as needed for wheezing or shortness of breath. Long, [name], MD unk Active Multiple Informants  allopurinol (ZYLOPRIM) 100 MG tablet [id number]  No Take 100 mg by mouth daily. [provider] 04/17/2020 Active Multiple Informants           Med Note [name], [name]   Tue Oct 23, 2017  2:58 AM)    [name] ELLIPTA 62.5-25 MCG/INH AEPB [id number] No Inhale 1 puff into the lungs daily.  [provider] 04/18/2020 Unknown time Active Multiple Informants  benzonatate (TESSALON) 200 MG capsule [id number] No Take 1 capsule (200 mg total) by mouth in the morning, at noon, and at bedtime. 7 DS Niel Hummer A, MD 04/16/2020 Active Multiple Informants  Blood Glucose Monitoring Suppl (ACCU-CHEK AVIVA PLUS) w/Device KIT [id number] No 1 each by Other route daily.  [provider] unk unk Active Multiple Informants  calcitRIOL (ROCALTROL) 0.5 MCG capsule [id number] No Take 1 capsule (0.5 mcg total) by mouth daily. Modena Jansky, MD 04/17/2020 Active Multiple Informants           Med Note [name], [name]   Tue Oct 23, 2017  2:59 AM)    carvedilol (COREG) 6.25 MG tablet [id number] No Take 1 tablet (6.25 mg total) by mouth 2 (two) times daily with a meal. Bensimhon, [name], MD 04/17/2020 1700 Active Multiple Informants  clonazePAM (KLONOPIN) 2 MG tablet [id number] No Take 1 tablet (2 mg total) by mouth 3 (three) times daily. for anxiety [name], MD 04/18/2020 Unknown time Active Multiple Informants  cyclobenzaprine (FLEXERIL) 10 MG tablet [id number] No Take 10 mg by mouth 3 (three) times daily. [provider] 04/17/2020 Active Multiple Informants  diclofenac sodium (VOLTAREN) 1 % GEL [id number] No Apply 2 g topically 4 (four) times daily as needed (for pain). [name], MD Past Week Unknown time Active Multiple Informants  EPINEPHrine 0.3 mg/0.3 mL IJ SOAJ injection [id number] No Inject 0.3 mg into the muscle daily as needed (allergic reaction). [provider] unk Active Multiple Informants  FEROSUL 325 (65 Fe) MG tablet [id number] No Take 325 mg by mouth daily. [provider] 04/17/2020 Active Multiple  Informants           Med Note [name], [name]   Tue Oct 23, 2017  2:57 AM)    glucose blood test strip [id number] No Use as instructed to check blood sugar up to 4 times daily  Patient taking differently: 1 each by Other route See admin instructions. Use as instructed to check blood sugar up to 4 times daily   [name], DO unk unk Active Multiple Informants  guaiFENesin (MUCINEX) 600 MG 12 hr tablet [id number] No Take 2 tablets (1,200 mg total) by mouth 2 (two) times daily. Regalado, Belkys A, MD unk Active Multiple Informants  ipratropium-albuterol (DUONEB) 0.5-2.5 (3) MG/3ML SOLN [id number] No Take 3 mLs by nebulization 4 (four) times daily.  [provider] unk Active Multiple Informants           Med Note ([name], [name]  Tue Oct 23, 2017  2:57 AM)    levothyroxine (SYNTHROID, LEVOTHROID) 112 MCG tablet [id number] No Take 224 mcg by mouth daily. [provider] 04/18/2020 Unknown time Active Multiple Informants           Med Note [name], [name]   Tue Oct 23, 2017  2:57 AM)    lisinopril (PRINIVIL,ZESTRIL) 5 MG tablet [id number] No Take 1 tablet (5 mg total) by mouth daily. Please call for office visit [phone] [name], MD 04/18/2020 Unknown time Active Multiple Informants           Med Note [name], [name]   Tue Oct 23, 2017  2:58 AM)    loperamide (IMODIUM) 2 MG capsule [id number] No Take 1 capsule (2 mg total) by mouth 4 (four) times daily as needed for diarrhea or loose stools. [name], MD unk Active Multiple Informants  metFORMIN (GLUCOPHAGE) 500 MG tablet [id number] No Take 1 tablet (500 mg total) by mouth 2 (two) times daily with a meal. [name], MD 04/17/2020 Active Multiple Informants           Med Note [name], [name]   Tue Oct 23, 2017  2:58 AM)    nitroGLYCERIN (NITROSTAT) 0.4 MG SL tablet [id number] No Place 1 tablet (0.4 mg total) under the tongue every 5 (five) minutes as needed for chest pain. [name], MD unk Active  Multiple Informants  nystatin cream (MYCOSTATIN) [id number] No Apply 1 application topically 2 (two) times daily as needed for dry skin. Regalado, Belkys A, MD 04/16/2020 Active Multiple Informants  omeprazole (PRILOSEC) 20 MG capsule [id number] No Take 20 mg by mouth 2 (two) times daily before a meal. [provider] 04/17/2020 Active Multiple Informants  oxyCODONE-acetaminophen (PERCOCET) 10-325 MG tablet [id number] No Take 1 tablet by mouth every 6 (six) hours as needed for pain. [provider] 04/17/2020 Active Multiple Informants           Med Note Marcellina Millin Apr 12, 2020  1:22 PM) LF on 03-25-20 # 120 DS 30  OXYGEN [id number] No Inhale 2-3 L into the lungs continuous. [provider] 01/14/2020 Unknown time Active Multiple Informants           Med Note Corky Mull   Mon Apr 12, 2020 10:08 AM)    potassium chloride SA (K-DUR,KLOR-CON) 20 MEQ tablet [id number] No Take 1 tablet (20 mEq total) by mouth 2 (two) times daily. [name], MD 04/17/2020 Active Multiple Informants  QC STOOL SOFTENER PLS LAXATIVE 8.6-50 MG tablet [id number] No Take 1 tablet by mouth at bedtime. [provider] 04/17/2020 Active Multiple Informants           Med Note [name], [name]   Tue Oct 23, 2017  2:56 AM)    rosuvastatin (CRESTOR) 10 MG tablet [id number] No Take 10 mg by mouth every evening. [provider] 04/16/2020 Active Multiple Informants  torsemide (DEMADEX) 20 MG tablet [id number]  TAKE 4 (FOUR) TABLETS BY MOUTH DAILY (2AM+ 2NOON) [name], MD  Active   traZODone (DESYREL) 50 MG tablet [id number] No Take 100 mg by mouth at bedtime. [provider] 04/17/2020 Active Multiple Informants          Patient Active Problem List   Diagnosis Date Noted  . Acute on chronic respiratory failure with hypoxia and hypercapnia ([place]) 04/18/2020  . Acute on chronic respiratory failure with hypoxia ([place]) 04/18/2020  . Hemoptysis 04/18/2020  .  Acute  metabolic encephalopathy 37/29/0211  . Acute encephalopathy 04/18/2020  . Chronic diastolic CHF (congestive heart failure) ([place]) 04/18/2020  . Sepsis ([place]) 04/18/2020  . Morbid obesity with BMI of 50.0-59.9, adult ([place])   . Fall at home, initial encounter 06/23/2018  . HNP (herniated nucleus pulposus), thoracic 06/23/2018  . CHF (congestive heart failure) ([place]) 06/18/2018  . Hypertensive disorder 08/28/2017  . Obesity 08/28/2017  . COPD exacerbation ([place]) 08/04/2017  . Acute and chronic respiratory failure with hypercapnia ([place])   . Pulmonary HTN ([place])   . Genetic testing 02/27/2017  . Family history of thyroid cancer   . Hypomagnesemia 10/03/2016  . Atypical chest pain   . HLD (hyperlipidemia) 09/23/2016  . Gout 09/23/2016  . DVT (deep vein thrombosis) in pregnancy 09/23/2016  . Thyroid cancer ([place]) 09/23/2016  . Hypocalcemia 09/23/2016  . AKI (acute kidney injury) ([place]) 09/23/2016  . Acute pulmonary edema (H[facility])   . Acute hypoxemic respiratory failure ([place])   . Ventilator dependent ([place])   . Chronic respiratory failure with hypoxia and hypercapnia ([place]) 07/16/2016  . CAP (community acquired pneumonia) 01/23/2016  . 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 Pulmonary hypertension ([place]) 01/23/2016  . Obesity hypoventilation syndrome ([place]) 08/26/2015  . Dyslipidemia associated with type 2 diabetes mellitus ([place]) 04/24/2015  . Leukocytosis 04/24/2015  . Controlled type 2 diabetes mellitus without complication, without long-term current use of insulin ([place]) 04/24/2015  . Anxiety 04/24/2015  . Benign essential HTN 04/24/2015  . Normocytic anemia 04/24/2015  . OSA (obstructive sleep apnea) 05/10/2012  . Tobacco abuse 07/04/2011    Conditions to be addressed/monitored: HTN and DM  Care Plan : Medication Management  Updates made by Lane Hacker, [place] since 06/28/2020 12:00 AM    Problem: Health Promotion or Disease Self-Management (General Plan of  Care)     Goal: Medication Management   Note:   Current Barriers:  . Does not adhere to prescribed medication regimen . Does not maintain contact with provider office . Does not contact provider office for questions/concerns .   Pharmacist Clinical Goal(s):  Marland Kitchen Over the next 30 days, patient will achieve adherence to monitoring guidelines and medication adherence to achieve therapeutic efficacy . contact provider office for questions/concerns as evidenced notation of same in electronic health record through collaboration with PharmD and provider.  .   Interventions: . Inter-disciplinary care team collaboration (see longitudinal plan of care) . Comprehensive medication review performed; medication list updated in electronic medical record  '@RXCPDIABETES' @ '@RXCPHYPERTENSION' @ '@RXCPHYPERLIPIDEMIA' @  Patient Goals/Self-Care Activities . Over the next 30 days, patient will:  - take medications as prescribed collaborate with provider on medication access solutions  Follow Up Plan: The care management team will reach out to the patient again over the next 30 days.     Task: Mutually Develop and Royce Macadamia Achievement of Patient Goals   Note:   Care Management Activities:    - verbalization of feelings encouraged    Notes:      Medication Assistance: None required. Patient affirms current coverage meets needs.   Follow up: Agree/Does not agree  Plan: The care management team will reach out to the patient again over the next 30 days.   [place] Constable, Pharm.D., Managed Medicaid Pharmacist - [phone]

## 2020-06-28 NOTE — Progress Notes (Signed)
   Subjective: 60 y.o. female presenting today for follow-up evaluation of bilateral heel pain.  Patient was last seen in the office on 03/11/2020 where she received steroid injections which helped temporarily.  She says that overall there is some modest improvement.  Patient also complains of thickened elongated toenails.  Patient is diabetic and unable to trim her own toenails.  Very painful and sensitive with certain shoe gear.  She presents for further treatment evaluation   Past Medical History:  Diagnosis Date  . Anxiety   . Arthritis   . Asthma   . Chronic diastolic CHF (congestive heart failure) ([place])   . COPD (chronic obstructive pulmonary disease) (H[facility])    Uses Oxygen at night  . Depression   . Diabetes mellitus without complication ([place])   . Dyspnea    occ  . Family history of thyroid cancer   . GERD (gastroesophageal reflux disease)   . Gout   . Headache    migraines  . History of DVT of lower extremity   . History of nuclear stress test    Myoview 2/17:  Low risk stress nuclear study with a small, moderate intensity, partially reversible inferior lateral defect consistent with small prior infarct and minimal peri-infarct ischemia; EF 68 with normal wall motion  . Hypertension   . Pneumonia   . Thyroid cancer ([place]) 09/23/2016     Objective: Physical Exam General: The patient is alert and oriented x3 in no acute distress.  Dermatology: Skin is warm, dry and supple bilateral lower extremities. Negative for open lesions or macerations bilateral.  Hyperkeratotic dystrophic nails noted 1-5 bilatera hyperkeratotic dystrophic nails noted 1-5 bilateral l Vascular: Dorsalis Pedis and Posterior Tibial pulses palpable bilateral.  Capillary fill time is immediate to all digits.  Neurological: Epicritic and protective threshold diminished bilateral.   Musculoskeletal: Tenderness to palpation to the plantar aspect of the bilateral heels along the plantar fascia. All other joints  range of motion within normal limits bilateral. Strength 5/5 in all groups bilateral.   Radiographic exam: Normal osseous mineralization. Joint spaces preserved. No fracture/dislocation/boney destruction. No other soft tissue abnormalities or radiopaque foreign bodies.   Assessment: 1. plantar fasciitis bilateral feet 2.  Pain due to onychomycosis of toenail bilateral 3.  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 Patient evaluated. Xrays reviewed.   2. Injection of 0.5cc Celestone soluspan injected into the bilateral heels.  3.  Mechanical debridement of nails 1-5 bilateral was performed using a nail nipper without incident or bleeding 4.  Continue wearing diabetic shoes 5.  Return to clinic in 3 months  Edrick Kins, DPM [facility]  Dr. Edrick Kins, DPM    [address], [place] [address]                Office [phone]  Fax [phone]

## 2020-06-28 NOTE — Patient Instructions (Signed)
Visit Information  Ms. Aguado was given information about Medicaid Managed Care team care coordination services as a part of their [name] Medicaid benefit. [name] verbally consented to engagement with the [name] Managed Care team.   For questions related to your [facility] health plan, please call: [phone]  If you would like to schedule transportation through your [facility] plan, please call the following number at least 2 days in advance of your appointment: [phone]   Call the [place] at [phone], at any time, 24 hours a day, 7 days a week. If you are in danger or need immediate medical attention call 911.  Ms. Gerstel - following are the goals we discussed in your visit today:  Goals Addressed   None     Please see education materials related to DM provided as print materials.   Patient verbalizes understanding of instructions provided today.   The Managed Medicaid care management team will reach out to the patient again over the next 30 days.   Arizona Constable, Pharm.D., Managed Medicaid Pharmacist [phone]   Following is a copy of your plan of care:  Patient Care Plan: General Plan of Care (Adult)    Problem Identified: Health Promotion or Disease Self-Management (General Plan of Care)   Priority: Medium  Onset Date: 04/23/2020    Problem Identified: Barriers to Treatment     Long-Range Goal: Barriers to Treatment Identified and Managed   Start Date: 05/03/2020  Expected End Date: 08/01/2020  Recent Progress: Not on track  Priority: High  Note:   CARE PLAN ENTRY Medicaid Managed Care (see longitudinal plan of care for additional care plan information)  Current Barriers:  . Transportation barriers-patient utilizing [employer] transportation as needed. . Limited access to caregiver-patient needs PCS, currently not receiving services she is expecting.  Patient states she is now receiving PCS.  Nurse  Clinical Goal(s):  Marland Kitchen Over the next 30 days, patient will work with [employer] to address needs related to transportation. Marland Kitchen Update 05/12/20:  Patient has used [employer] and is very pleased. . Over the next 90 days, patient will attend all scheduled medical appointments. Marland Kitchen RNCM will make referral to SW for assistance with PCS . Update 05/20/20:  Patient states she is now receiving PCS from [facility] 3 days a week for 4 hours each day. Marland Kitchen Update 05/27/20: BSW spoke with patient about PCS, she stated someone usually comes in 3 days a week for a few hours. She stated her aide was off today. BSW contacted [place] and spoke with [facility], she stated that patient has 13 hours per week with 6 days a week, however patient chooses to have an aide come out 3 days a week so that she can have more hours. The aide assist with most ADL's. Marland Kitchen Update 06/04/20: Patient stated she is ready to move to [facility]. She has already started the process but wants to save some money first and she has to wait on her nephew who will drive the uhaul truck. Marland Kitchen Update 06/24/20: BSW contacted patient to follow up on her moving to [facility], she stated she is still saving money. Patient did cut call short because she was sleeping, patient stated she was not in need of any resources at this time.  Interventions: . Inter-disciplinary care team collaboration (see longitudinal plan of care) . Referred patient to community resources care guide team for assistance with transportation. . Provided patient with information  about [employer]. . Discussed plans with patient for ongoing care management follow up and provided patient with direct contact information for care management team . Advised patient to call [facility]. Marland Kitchen Collaborated with [facility] regarding transportation. . Assisted patient/caregiver with obtaining information about health plan  benefits . Collaborated with Social Work regarding [employer]. Marland Kitchen Update 06/14/20:  Patient continues to utilize [employer], aide, [employer] as needed-has missed appointments due to transportation "no shows"-will continue to follow.  Patient Self Care Activities:  . Patient will call [employer]. Marland Kitchen Update 06/07/20:  Patient continues to utilize [facility] with success. Marland Kitchen Update 06/14/20:  Patient has missed appointments due to transportation no shows.  [facility] transportation has been utilized as needed.      Task: Alleviate Barriers to Mental Health Treatment   Note:   Care Management Activities:    - willingness to receive help encouraged    Notes:    Patient Care Plan: General Plan of Care (Adult)    Problem Identified: Health Promotion or Disease Self-Management (General Plan of Care)   Priority: High  Onset Date: 04/23/2020    Long-Range Goal: Self-Management Plan Developed   Start Date: 04/23/2020  Expected End Date: 07/22/2020  Recent Progress: Not on track  Priority: Medium  Note:   Current Barriers:  . Chronic Disease Management support and education needs.  Patient with abdominal wall infection-seen at [facility] ED 05/24/20 - I&D performed. Marland Kitchen Update 06/01/20:  Patient states she is improving-HHRN is cleaning and dressing infection. Marland Kitchen Update 06/07/20:  Infection continues to improve per patient, HHRN continues to clean and dress infection site. Marland Kitchen Update 06/17/20:  Infection improved, healed.  Nurse Case Manager Clinical Goal(s):  Marland Kitchen Over the next 30 days, patient will attend all scheduled medical appointments:  . Over the next 30 days, patient will work with CM team pharmacist to review medications. Marland Kitchen Update 05/20/20:  Patient has appointment with Pharmacist 05/21/20. Marland Kitchen Update 05/25/20:  Patient declines to speak with Pharmacist at this time.  Interventions:  . Inter-disciplinary care team collaboration (see longitudinal plan of  care) . Evaluation of current treatment plan and patient's adherence to plan as established by provider. . Reviewed medications with patient. [name] with pharmacist regarding medications. . Discussed plans with patient for ongoing care management follow up and provided patient with direct contact information for care management team . Reviewed scheduled/upcoming provider appointments.  Marland Kitchen Update 05/20/20:  Patient unable to attend PT appointment 05/19/20, has next appointment 05/26/20.  Has Cardiology appointment 05/28/20.  Reviewed [name] information with patient again and with aide. Marland Kitchen Update 06/07/20:  Patient has PT appointment 06/09/20. Marland Kitchen Update 06/17/20:  patient attended PT appointment 06/16/20 and also attended appointment at [facility] this week-patient states she feels better. Marland Kitchen Pharmacy referral for medication review. Marland Kitchen Update 05/24/20: Pharmacist has attempted to speak with patient and has been unable to.  Patient at Urgent Care today with stomach pain.  RNCM will reschedule appointment with patient and speak with her regarding importance of speaking with Pharmacist. . Update 05/25/20: Patient declines to speak with Pharmacist at this time.  Patient Goals/Self-Care Activities Over the next 30 days, patient will:  -Attends all scheduled provider appointments Calls pharmacy for medication refills Calls provider office for new concerns or questions  Follow Up Plan: The Managed Medicaid care management team will reach out to the patient again over the next 30 days.  The patient has been provided with contact information for the  Managed Medicaid care management team and has been advised to call with any health related questions or concerns.       Task: Mutually Develop and Royce Macadamia Achievement of Patient Goals   Note:   Care Management Activities:    - verbalization of feelings encouraged    Notes:    Patient Care Plan: Medication Management    Problem  Identified: Health Promotion or Disease Self-Management (General Plan of Care)     Goal: Medication Management   Note:   Current Barriers:  . Does not adhere to prescribed medication regimen . Does not maintain contact with provider office . Does not contact provider office for questions/concerns .   Pharmacist Clinical Goal(s):  Marland Kitchen Over the next 30 days, patient will achieve adherence to monitoring guidelines and medication adherence to achieve therapeutic efficacy . contact provider office for questions/concerns as evidenced notation of same in electronic health record through collaboration with PharmD and provider.  .   Interventions: . Inter-disciplinary care team collaboration (see longitudinal plan of care) . Comprehensive medication review performed; medication list updated in electronic medical record  @RXCPDIABETES @ @RXCPHYPERTENSION @ @RXCPHYPERLIPIDEMIA @  Patient Goals/Self-Care Activities . Over the next 30 days, patient will:  - take medications as prescribed collaborate with provider on medication access solutions  Follow Up Plan: The care management team will reach out to the patient again over the next 30 days.     Task: Mutually Develop and Royce Macadamia Achievement of Patient Goals   Note:   Care Management Activities:    - verbalization of feelings encouraged    Notes:

## 2020-06-28 NOTE — Patient Outreach (Signed)
Care Coordination  06/28/2020  Michele Owens 26-May-1960 [id number]   RNCM returned patient's phone call.  Patient upset about several things today.  Patient states her aide did not come today as planned and is now scheduled to come tomorrow.  Patient also believes someone has been in her apartment, unscheduled...she is ready to move.  Patient states  she has completed the application for [facility] and plans on turning application in tomorrow.  [name] RN, BSN [place]  [facility] - Managed Medicaid High Risk [phone].

## 2020-06-29 ENCOUNTER — Other Ambulatory Visit: Payer: Self-pay

## 2020-06-29 ENCOUNTER — Other Ambulatory Visit: Payer: Self-pay | Admitting: Obstetrics and Gynecology

## 2020-06-29 DIAGNOSIS — Z01419 Encounter for gynecological examination (general) (routine) without abnormal findings: Secondary | ICD-10-CM | POA: Diagnosis not present

## 2020-06-29 DIAGNOSIS — Z1272 Encounter for screening for malignant neoplasm of vagina: Secondary | ICD-10-CM | POA: Diagnosis not present

## 2020-06-29 DIAGNOSIS — Z7689 Persons encountering health services in other specified circumstances: Secondary | ICD-10-CM | POA: Diagnosis not present

## 2020-06-29 NOTE — Patient Outreach (Signed)
Care Coordination  06/29/2020  Michele Owens [birth year] [id number]   RNCM returned patient's phone call.  Patient upset that Home Health aide did not show up today as scheduled. RNCM provided support and encouragement.  RNCM will continue to follow patient.  [name] RN, BSN [place]  [facility] [employer]ator - Managed Medicaid High Risk [phone].

## 2020-06-30 ENCOUNTER — Other Ambulatory Visit: Payer: Self-pay

## 2020-06-30 ENCOUNTER — Other Ambulatory Visit: Payer: Medicaid Other | Admitting: Obstetrics and Gynecology

## 2020-06-30 NOTE — Patient Outreach (Signed)
Care Coordination  06/30/2020  Kenidi Elenbaas 02-03-1961 [id number]   RNCM returned patient's phone call.  Patient called to discuss her move to [facility].  [name] RN, BSN [place]  [facility] [employer]ator - Managed Medicaid High Risk [phone].

## 2020-07-01 ENCOUNTER — Other Ambulatory Visit: Payer: Medicaid Other | Admitting: Obstetrics and Gynecology

## 2020-07-01 NOTE — Patient Outreach (Signed)
Care Coordination  07/01/2020  Michele Owens [birth year] [id number]   RNCM returned patient's phone call.  Patient updating RNCM regarding  aide status.   [name] RN, BSN [place]  [facility] [employer]ator - Managed Medicaid High Risk [phone].

## 2020-07-05 ENCOUNTER — Other Ambulatory Visit: Payer: Medicaid Other | Admitting: Obstetrics and Gynecology

## 2020-07-05 ENCOUNTER — Other Ambulatory Visit: Payer: Self-pay

## 2020-07-05 NOTE — Patient Outreach (Signed)
Care Coordination  07/05/2020  Michele Owens [birth year] [id number]   RNCM returned patient's phone call.  Patient states she has blood in her stool and has appointment with her provider tomorrow at 3.  RNCM will continue to follow.  [name] RN, BSN [place]  Triad [employer]ator - Managed Medicaid High Risk [phone].

## 2020-07-06 DIAGNOSIS — Z7689 Persons encountering health services in other specified circumstances: Secondary | ICD-10-CM | POA: Diagnosis not present

## 2020-07-06 DIAGNOSIS — M179 Osteoarthritis of knee, unspecified: Secondary | ICD-10-CM | POA: Diagnosis not present

## 2020-07-06 DIAGNOSIS — K5900 Constipation, unspecified: Secondary | ICD-10-CM | POA: Diagnosis not present

## 2020-07-06 DIAGNOSIS — K219 Gastro-esophageal reflux disease without esophagitis: Secondary | ICD-10-CM | POA: Diagnosis not present

## 2020-07-06 DIAGNOSIS — R195 Other fecal abnormalities: Secondary | ICD-10-CM | POA: Diagnosis not present

## 2020-07-07 DIAGNOSIS — K922 Gastrointestinal hemorrhage, unspecified: Secondary | ICD-10-CM | POA: Diagnosis not present

## 2020-07-09 ENCOUNTER — Other Ambulatory Visit: Payer: Self-pay | Admitting: Obstetrics and Gynecology

## 2020-07-09 DIAGNOSIS — Z419 Encounter for procedure for purposes other than remedying health state, unspecified: Secondary | ICD-10-CM | POA: Diagnosis not present

## 2020-07-09 NOTE — Patient Outreach (Signed)
Care Coordination  07/09/2020  Michele Owens Dec 29, 1960 [id number]    Medicaid Managed Care   Unsuccessful Outreach Note  07/09/2020 Name: Michele Owens MRN: [id number] DOB: 1960-09-01  Referred by: Everrett Coombe, MD Reason for referral : High Risk Managed Medicaid (Unsuccessful telephone outreach)   An unsuccessful telephone outreach was attempted today. The patient was referred to the case management team for assistance with care management and care coordination.   Follow Up Plan: A member of the Managed Medicaid  care management team will reach out to the patient again over the next 7 days.   [name] RN, BSN   [facility] [employer] - Managed Medicaid High Risk [phone].

## 2020-07-09 NOTE — Patient Instructions (Signed)
Visit Information  Ms. [name]  - as a part of your Medicaid benefit, you are eligible for care management and care coordination services at no cost or copay. I was unable to reach you by phone today but would be happy to help you with your health related needs. Please feel free to call me at [phone].  A member of the Managed Medicaid care management team will reach out to you again over the next 7 days.   [name] RN, BSN [place]  [facility] [employer]ator - Managed Medicaid High Risk [phone].

## 2020-07-15 ENCOUNTER — Other Ambulatory Visit: Payer: Self-pay | Admitting: Obstetrics and Gynecology

## 2020-07-15 NOTE — Patient Outreach (Signed)
Care Coordination  07/15/2020  Michele Owens 04/20/1960 [id number]    Medicaid Managed Care   Unsuccessful Outreach Note  07/15/2020 Name: Michele Owens MRN: [id number] DOB: 1961-03-18  Referred by: Everrett Coombe, MD Reason for referral : High Risk Managed Medicaid (Unsuccessful telephone outreach)  An unsuccessful telephone outreach was attempted today. The patient was referred to the case management team for assistance with care management and care coordination.   Follow Up Plan: A member of the Managed Medicaid  care management team will reach out to the patient again over the next 7 days.   [name] RN, BSN [place]  Tri[facility] [employer]ator - Managed Medicaid High Risk [phone].

## 2020-07-15 NOTE — Patient Instructions (Signed)
Visit Information  Ms. [name]  - as a part of your Medicaid benefit, you are eligible for care management and care coordination services at no cost or copay. I was unable to reach you by phone today but would be happy to help you with your health related needs. Please feel free to call me at [phone].  A member of the Managed Medicaid care management team will reach out to you again over the next 7 days.   [name] RN, BSN [place]  [facility]ator - Managed Medicaid High Risk [phone].

## 2020-07-16 ENCOUNTER — Other Ambulatory Visit: Payer: Self-pay

## 2020-07-16 ENCOUNTER — Other Ambulatory Visit: Payer: Self-pay | Admitting: Obstetrics and Gynecology

## 2020-07-16 DIAGNOSIS — E119 Type 2 diabetes mellitus without complications: Secondary | ICD-10-CM | POA: Diagnosis not present

## 2020-07-16 DIAGNOSIS — Z7689 Persons encountering health services in other specified circumstances: Secondary | ICD-10-CM | POA: Diagnosis not present

## 2020-07-16 DIAGNOSIS — J449 Chronic obstructive pulmonary disease, unspecified: Secondary | ICD-10-CM | POA: Diagnosis not present

## 2020-07-16 NOTE — Patient Outreach (Signed)
Care Coordination  07/16/2020  Michele Owens 05-19-1960 [id number]   RNCM returned patient's phone call.  Patient states she has an appointment with her Doctor this afternoon.  RNCM will follow up with patient at scheduled time.   [name] RN, BSN [place]  Triad [employer]ator - Managed Medicaid High Risk [phone].

## 2020-07-19 ENCOUNTER — Other Ambulatory Visit: Payer: Self-pay | Admitting: Obstetrics and Gynecology

## 2020-07-19 ENCOUNTER — Other Ambulatory Visit: Payer: Self-pay

## 2020-07-19 NOTE — Patient Outreach (Signed)
Care Coordination  07/19/2020  Michele Owens 10-27-1960 [id number]   RNCM returned patient's phone call.  Patient called RNCM to provide information regarding recent eye doctor appointment.  [name] RN, BSN [place]  Triad [facility]ator - Managed Medicaid High Risk [phone].

## 2020-07-20 ENCOUNTER — Other Ambulatory Visit: Payer: Self-pay

## 2020-07-20 ENCOUNTER — Other Ambulatory Visit: Payer: Self-pay | Admitting: Obstetrics and Gynecology

## 2020-07-20 NOTE — Patient Outreach (Signed)
Care Coordination  07/20/2020  Afrika Brick 05/25/1960 [id number]   RNCM returned patient's phone call.  Patient stated she had just returned home and asked to call RNCM back.   [name] RN, BSN [place]  Triad Curator - Managed Medicaid High Risk [phone].

## 2020-07-23 ENCOUNTER — Other Ambulatory Visit: Payer: Self-pay

## 2020-07-23 NOTE — Patient Outreach (Signed)
Care Coordination  07/23/2020  Michele Owens January 14, 1961 [id number]   Medicaid Managed Care   Unsuccessful Outreach Note  07/23/2020 Name: Michele Owens MRN: [id number] DOB: 07-Jul-1960  Referred by: Everrett Coombe, MD Reason for referral : High Risk Managed Medicaid (MM Unsuccessful Telephone Outreach)   An unsuccessful telephone outreach was attempted today. The patient was referred to the case management team for assistance with care management and care coordination.   Follow Up Plan: The care management team will reach out to the patient again over the next 30 days.   Michele Owens, BSW, [place]  High Risk Managed Medicaid Team

## 2020-07-23 NOTE — Patient Instructions (Signed)
Visit Information  Ms. Michele Owens  - as a part of your Medicaid benefit, you are eligible for care management and care coordination services at no cost or copay. I was unable to reach you by phone today but would be happy to help you with your health related needs. Please feel free to call me @ [phone].   A member of the Managed Medicaid care management team will reach out to you again over the next 30 days.   Mickel Fuchs, BSW, [place]  High Risk Managed Medicaid Team

## 2020-07-26 ENCOUNTER — Other Ambulatory Visit: Payer: Self-pay

## 2020-07-27 ENCOUNTER — Other Ambulatory Visit: Payer: Self-pay | Admitting: Obstetrics and Gynecology

## 2020-07-27 NOTE — Patient Instructions (Signed)
Visit Information  Ms. Michele Owens  - as a part of your Medicaid benefit, you are eligible for care management and care coordination services at no cost or copay. I was unable to reach you by phone today but would be happy to help you with your health related needs. Please feel free to call me [phone].  A member of the Managed Medicaid care management team will reach out to you again over the next 7 days.   [name] RN, BSN [place]  Triad [facility]ator - Managed Medicaid High Risk [phone].

## 2020-07-27 NOTE — Patient Outreach (Signed)
Care Coordination  07/27/2020  Michele Owens 11-03-1960 [id number]   RNCM returned patient's phone call, no answer, left message.  [name] RN, BSN [place]  Tri[facility]ator - Managed Medicaid High Risk [phone].

## 2020-07-28 ENCOUNTER — Other Ambulatory Visit: Payer: Self-pay | Admitting: Obstetrics and Gynecology

## 2020-07-28 ENCOUNTER — Telehealth: Payer: Self-pay | Admitting: *Deleted

## 2020-07-28 ENCOUNTER — Other Ambulatory Visit: Payer: Self-pay

## 2020-07-28 NOTE — Telephone Encounter (Signed)
   Telephone encounter was:  Successful.  07/28/2020 Name: Michele Owens MRN: [id number] DOB: July 14, 1960  Michele Owens is a 60 y.o. year old female who is a primary care patient of [name], [name], MD . The community resource team was consulted for assistance with Transportation Needs   Care guide performed the following interventions: Patient provided with information about care guide support team and interviewed to confirm resource needs Discussed resources to assist with transportation Follow up call placed to community resources to determine status of patients referral.  Follow Up Plan:  Care guide will follow up with patient by phone over the next days [place], Care Management  [phone] [address] , [place] [address] Email : [email or web address]

## 2020-07-28 NOTE — Telephone Encounter (Signed)
   Telephone encounter was:  Successful.  07/28/2020 Name: Michele Owens MRN: [id number] DOB: Feb 19, 1961  Michele Owens is a 60 y.o. year old female who is a primary care patient of [name], [name], MD . The community resource team was consulted for assistance with Transportation Needs   Care guide performed the following interventions: Patient provided with information about care guide support team and interviewed to confirm resource needs Follow up call placed to community resources to determine status of patients referral.  Follow Up Plan:  No further follow up planned at this time. The patient has been provided with needed resources.  [place], Care Management  [phone] [address] , [place] [address] Email : Ashby [email or web address]. [email or web address]

## 2020-07-28 NOTE — Patient Outreach (Signed)
Care Coordination  07/28/2020  Michele Owens Apr 14, 1960 [id number]   RNCM returned patient's phone call.  Patient with transportation questions/issues-will refer  to Care Guide for assistance.  [name] RN, BSN   [facility] - Managed Medicaid High Risk [phone].

## 2020-07-29 ENCOUNTER — Other Ambulatory Visit: Payer: Self-pay

## 2020-07-29 NOTE — Patient Outreach (Signed)
Medicaid Managed Care    Pharmacy Note  07/29/2020 Name: Michele Owens MRN: [id number] DOB: 04/07/1961  Michele Owens is a 60 y.o. year old female who is a primary care patient of [name], [name], MD. The [facility] Managed Care Coordination team was consulted for assistance with disease management and care coordination needs.    Engaged with patient Engaged with patient by telephone for follow up visit in response to referral for case management and/or care coordination services.  Michele Owens was given information about Managed Medicaid Care Coordination team services today. Michele Owens agreed to services and verbal consent obtained.   Objective:  Lab Results  Component Value Date   CREATININE 0.67 06/22/2020   CREATININE 0.65 05/24/2020   CREATININE 0.61 04/20/2020    Lab Results  Component Value Date   HGBA1C 6.2 (H) 04/19/2020       Component Value Date/Time   CHOL 226 (H) 08/05/2017 0318   TRIG 59 08/05/2017 0318   HDL 80 08/05/2017 0318   CHOLHDL 2.8 08/05/2017 0318   VLDL 12 08/05/2017 0318   LDLCALC 134 (H) 08/05/2017 0318    Other: (TSH, CBC, Vit D, etc.)  Clinical ASCVD: No  The 10-year ASCVD risk score Michele Owens DC Jr., et al., 2013) is: 11.2%   Values used to calculate the score:     Age: 60 years     Sex: Female     Is Non-Hispanic African American: Yes     Diabetic: Yes     Tobacco smoker: No     Systolic Blood Pressure: 932 mmHg     Is BP treated: Yes     HDL Cholesterol: 80 mg/dL     Total Cholesterol: 226 mg/dL    Other: (CHADS2VASc if Afib, PHQ9 if depression, MMRC or CAT for COPD, ACT, DEXA)  BP Readings from Last 3 Encounters:  06/22/20 110/67  05/25/20 131/74  05/24/20 (!) 123/55    Assessment/Interventions: Review of patient past medical history, allergies, medications, health status, including review of consultants reports, laboratory and other test data, was performed as part of comprehensive evaluation and provision of chronic care  management services.    HTN Carvedilol 6.43m BID Lisinopril 544mQD Plan: At goal,  patient stable/ symptoms controlled   Edema Torsemide 2074m2AM and 2Noon) KCl Plan: At goal,  patient stable/ symptoms controlled   DM Metformin 500m69mBID Plan: At goal,  patient stable/ symptoms controlled   Lipids Rosuvastatin 10mg72mn: At goal,  patient stable/ symptoms controlled   Pain Left Foot:  Pain Score: 8/10 Oxycodone 10/325 Cyclobenzaprine March 2022: Missed PT appointment and now can't go back (Per patient).  Insomnia Trazodone 100mg 65mient not sleeping due to stress of her son passing Plan: At goal,  patient stable/ symptoms controlled   Gout Allopurinol 100mg -40mes has gout attack daily but then described OA pain and how her bones rub against each other in her knees and hips March 2022: Ask for Uric Acid Labs April 2022: No labs in yet  Thyroid Levothyroxine 112mcg 233mpril 2022: Patient hasn't had meds in 3 weeks. Called Pharmacy and they will deliver today  SDOH (Social Determinants of Health) assessments and interventions performed:    Care Plan  Allergies  Allergen Reactions  . Bee Venom Swelling and Other (See Comments)    "All over my body" (swelling)  . Fioricet [Butalbital-Apap-Caffeine] Nausea And Vomiting and Rash  . Ibuprofen Rash and Other (See Comments)    Severe rash  . Lamisil [  Terbinafine] Rash and Other (See Comments)    Pt states this causes her to "feel funny"  . Nsaids Other (See Comments)    Per MD's orders     Medications Reviewed Today    Reviewed by [name], RN (Registered Nurse) on 07/28/20 at [place] List Status: <None>  Medication Order Taking? Sig Documenting Provider Last Dose Status Informant  albuterol (PROVENTIL HFA;VENTOLIN HFA) 108 (90 Base) MCG/ACT inhaler [id number] No Inhale 1-2 puffs into the lungs every 6 (six) hours as needed for wheezing or shortness of breath. Long, [name], MD unk Active  Multiple Informants  allopurinol (ZYLOPRIM) 100 MG tablet [id number] No Take 100 mg by mouth daily. [provider] 04/17/2020 Active Multiple Informants           Med Note [name], [name]   Tue Oct 23, 2017  2:58 AM)    [name] ELLIPTA 62.5-25 MCG/INH AEPB [id number] No Inhale 1 puff into the lungs daily.  [provider] 04/18/2020 Unknown time Active Multiple Informants  benzonatate (TESSALON) 200 MG capsule [id number] No Take 1 capsule (200 mg total) by mouth in the morning, at Owens, and at bedtime. 7 DS Niel Hummer A, MD 04/16/2020 Active Multiple Informants  Blood Glucose Monitoring Suppl (ACCU-CHEK AVIVA PLUS) w/Device KIT [id number] No 1 each by Other route daily.  [provider] unk unk Active Multiple Informants  calcitRIOL (ROCALTROL) 0.5 MCG capsule [id number] No Take 1 capsule (0.5 mcg total) by mouth daily. Modena Jansky, MD 04/17/2020 Active Multiple Informants           Med Note [name], [name]   Tue Oct 23, 2017  2:59 AM)    carvedilol (COREG) 6.25 MG tablet [id number] No Take 1 tablet (6.25 mg total) by mouth 2 (two) times daily with a meal. Bensimhon, [name], MD 04/17/2020 1700 Active Multiple Informants  cefdinir (OMNICEF) 300 MG capsule [id number]  TAKE 1 CAPSULE (300 MG TOTAL) BY MOUTH TWO TIMES DAILY FOR 4 DAYS. [name], MD  Active   clonazePAM [name]) 2 MG tablet [id number] No Take 1 tablet (2 mg total) by mouth 3 (three) times daily. for anxiety [name], MD 04/18/2020 Unknown time Active Multiple Informants  cyclobenzaprine (FLEXERIL) 10 MG tablet [id number] No Take 10 mg by mouth 3 (three) times daily. [provider] 04/17/2020 Active Multiple Informants  diclofenac sodium (VOLTAREN) 1 % GEL [id number] No Apply 2 g topically 4 (four) times daily as needed (for pain). [name], MD Past Week Unknown time Active Multiple Informants  EPINEPHrine 0.3 mg/0.3 mL IJ SOAJ injection [id number] No Inject 0.3 mg into the muscle daily as  needed (allergic reaction). [provider] unk Active Multiple Informants  FEROSUL 325 (65 Fe) MG tablet [id number] No Take 325 mg by mouth daily. [provider] 04/17/2020 Active Multiple Informants           Med Note [name], [name]   Tue Oct 23, 2017  2:57 AM)    glucose blood test strip [id number] No Use as instructed to check blood sugar up to 4 times daily  Patient taking differently: 1 each by Other route See admin instructions. Use as instructed to check blood sugar up to 4 times daily   [name], DO unk unk Active Multiple Informants  guaiFENesin (MUCINEX) 600 MG 12 hr tablet [id number] No Take 2 tablets (1,200 mg total) by mouth 2 (two) times daily. Regalado, Cassie Freer, MD unk Active Multiple Informants  ipratropium-albuterol (DUONEB)  0.5-2.5 (3) MG/3ML SOLN [id number] No Take 3 mLs by nebulization 4 (four) times daily.  [provider] unk Active Multiple Informants           Med Note [name], [name]   Tue Oct 23, 2017  2:57 AM)    levothyroxine (SYNTHROID, LEVOTHROID) 112 MCG tablet [id number] No Take 224 mcg by mouth daily. [provider] 04/18/2020 Unknown time Active Multiple Informants           Med Note [name], [name]   Tue Oct 23, 2017  2:57 AM)    lisinopril (PRINIVIL,ZESTRIL) 5 MG tablet [id number] No Take 1 tablet (5 mg total) by mouth daily. Please call for office visit [phone] [name], MD 04/18/2020 Unknown time Active Multiple Informants           Med Note [name], [name]   Tue Oct 23, 2017  2:58 AM)    loperamide (IMODIUM) 2 MG capsule [id number] No Take 1 capsule (2 mg total) by mouth 4 (four) times daily as needed for diarrhea or loose stools. [name], MD unk Active Multiple Informants  metFORMIN (GLUCOPHAGE) 500 MG tablet [id number] No Take 1 tablet (500 mg total) by mouth 2 (two) times daily with a meal. [name], MD 04/17/2020 Active Multiple Informants           Med Note [name], [name]   Tue Oct 23, 2017  2:58 AM)    nitroGLYCERIN (NITROSTAT) 0.4 MG SL tablet [id number] No Place 1 tablet (0.4 mg total) under the tongue every 5 (five) minutes as needed for chest pain. [name], MD unk Active Multiple Informants  nystatin cream (MYCOSTATIN) [id number] No Apply 1 application topically 2 (two) times daily as needed for dry skin. Regalado, Belkys A, MD 04/16/2020 Active Multiple Informants  omeprazole (PRILOSEC) 20 MG capsule [id number] No Take 20 mg by mouth 2 (two) times daily before a meal. [provider] 04/17/2020 Active Multiple Informants  oxyCODONE-acetaminophen (PERCOCET) 10-325 MG tablet [id number] No Take 1 tablet by mouth every 6 (six) hours as needed for pain. [provider] 04/17/2020 Active Multiple Informants           Med Note Marcellina Millin Apr 12, 2020  1:22 PM) LF on 03-25-20 # 120 DS 30  OXYGEN [id number] No Inhale 2-3 L into the lungs continuous. [provider] 01/14/2020 Unknown time Active Multiple Informants           Med Note Corky Mull   Mon Apr 12, 2020 10:08 AM)    potassium chloride SA (K-DUR,KLOR-CON) 20 MEQ tablet [id number] No Take 1 tablet (20 mEq total) by mouth 2 (two) times daily. [name], MD 04/17/2020 Active Multiple Informants  QC STOOL SOFTENER PLS LAXATIVE 8.6-50 MG tablet [id number] No Take 1 tablet by mouth at bedtime. [provider] 04/17/2020 Active Multiple Informants           Med Note [name], [name]   Tue Oct 23, 2017  2:56 AM)    rosuvastatin (CRESTOR) 10 MG tablet [id number] No Take 10 mg by mouth every evening. [provider] 04/16/2020 Active Multiple Informants  torsemide (DEMADEX) 20 MG tablet [id number]  TAKE 4 (FOUR) TABLETS BY MOUTH DAILY (2AM+ 2NOON) [name], MD  Active   traZODone (DESYREL) 50 MG tablet [id number] No Take 100 mg by mouth at bedtime. [provider] 04/17/2020 Active Multiple Informants  Patient Active Problem List   Diagnosis  Date Noted  . Acute on chronic respiratory failure with hypoxia and hypercapnia ([place]) 04/18/2020  . Acute on chronic respiratory failure with hypoxia ([place]) 04/18/2020  . Hemoptysis 04/18/2020  . Acute metabolic encephalopathy 23/55/7322  . Acute encephalopathy 04/18/2020  . Chronic diastolic CHF (congestive heart failure) ([place]) 04/18/2020  . Sepsis ([place]) 04/18/2020  . Morbid obesity with BMI of 50.0-59.9, adult ([place])   . Fall at home, initial encounter 06/23/2018  . HNP (herniated nucleus pulposus), thoracic 06/23/2018  . CHF (congestive heart failure) ([place]) 06/18/2018  . Hypertensive disorder 08/28/2017  . Obesity 08/28/2017  . COPD exacerbation ([place]) 08/04/2017  . Acute and chronic respiratory failure with hypercapnia ([place])   . Pulmonary HTN ([place])   . Genetic testing 02/27/2017  . Family history of thyroid cancer   . Hypomagnesemia 10/03/2016  . Atypical chest pain   . HLD (hyperlipidemia) 09/23/2016  . Gout 09/23/2016  . DVT (deep vein thrombosis) in pregnancy 09/23/2016  . Thyroid cancer ([place]) 09/23/2016  . Hypocalcemia 09/23/2016  . AKI (acute kidney injury) ([place]) 09/23/2016  . Acute pulmonary edema (H[facility])   . Acute hypoxemic respiratory failure ([place])   . Ventilator dependent ([place])   . Chronic respiratory failure with hypoxia and hypercapnia ([place]) 07/16/2016  . CAP (community acquired pneumonia) 01/23/2016  . 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 Pulmonary hypertension ([place]) 01/23/2016  . Obesity hypoventilation syndrome ([place]) 08/26/2015  . Dyslipidemia associated with type 2 diabetes mellitus ([place]) 04/24/2015  . Leukocytosis 04/24/2015  . Controlled type 2 diabetes mellitus without complication, without long-term current use of insulin ([place]) 04/24/2015  . Anxiety 04/24/2015  . Benign essential HTN 04/24/2015  . Normocytic anemia 04/24/2015  . OSA (obstructive sleep apnea) 05/10/2012  . Tobacco abuse 07/04/2011    Conditions to be  addressed/monitored: HTN and DM  Care Plan : Medication Management  Updates made by Lane Hacker, [place] since 06/28/2020 12:00 AM    Problem: Health Promotion or Disease Self-Management (General Plan of Care)     Goal: Medication Management   Note:   Current Barriers:  . Does not adhere to prescribed medication regimen . Does not maintain contact with provider office . Does not contact provider office for questions/concerns .   Pharmacist Clinical Goal(s):  Marland Kitchen Over the next 30 days, patient will achieve adherence to monitoring guidelines and medication adherence to achieve therapeutic efficacy . contact provider office for questions/concerns as evidenced notation of same in electronic health record through collaboration with PharmD and provider.  .   Interventions: . Inter-disciplinary care team collaboration (see longitudinal plan of care) . Comprehensive medication review performed; medication list updated in electronic medical record  '@RXCPDIABETES' @ '@RXCPHYPERTENSION' @ '@RXCPHYPERLIPIDEMIA' @  Patient Goals/Self-Care Activities . Over the next 30 days, patient will:  - take medications as prescribed collaborate with provider on medication access solutions  Follow Up Plan: The care management team will reach out to the patient again over the next 30 days.     Task: Mutually Develop and Royce Macadamia Achievement of Patient Goals   Note:   Care Management Activities:    - verbalization of feelings encouraged    Notes:      Medication Assistance: None required. Patient affirms current coverage meets needs.   Follow up: Agree   Plan: The care management team will reach out to the patient again over the next 30 days.   [place] Constable, Pharm.D., Managed Medicaid Pharmacist - [phone]

## 2020-07-29 NOTE — Patient Instructions (Signed)
Visit Information  Michele Owens was given information about Medicaid Managed Care team care coordination services as a part of their [name] Medicaid benefit. Michele Owens verbally consented to engagement with the [name] Managed Care team.   For questions related to your [facility] health plan, please call: [phone]  If you would like to schedule transportation through your [facility] plan, please call the following number at least 2 days in advance of your appointment: [phone]   Call the [place] at [phone], at any time, 24 hours a day, 7 days a week. If you are in danger or need immediate medical attention call 911.  Michele Owens - following are the goals we discussed in your visit today:  Goals Addressed   None     Please see education materials related to DM provided as print materials.   Patient verbalizes understanding of instructions provided today.   The Managed Medicaid care management team will reach out to the patient again over the next 30 days.   Michele Owens, Pharm.D., Managed Medicaid Pharmacist [phone]   Following is a copy of your plan of care:  Patient Care Plan: General Plan of Care (Adult)    Problem Identified: Health Promotion or Disease Self-Management (General Plan of Care)   Priority: Medium  Onset Date: 04/23/2020    Problem Identified: Barriers to Treatment     Long-Range Goal: Barriers to Treatment Identified and Managed   Start Date: 05/03/2020  Expected End Date: 08/01/2020  Recent Progress: Not on track  Priority: High  Note:   CARE PLAN ENTRY Medicaid Managed Care (see longitudinal plan of care for additional care plan information)  Current Barriers:  . Transportation barriers-patient utilizing [employer] transportation as needed. . Limited access to caregiver-patient needs PCS, currently not receiving services she is expecting.  Patient states she is now receiving PCS.  Nurse  Clinical Goal(s):  Marland Kitchen Over the next 30 days, patient will work with [employer] to address needs related to transportation. Marland Kitchen Update 05/12/20:  Patient has used [employer] and is very pleased. . Over the next 90 days, patient will attend all scheduled medical appointments. Marland Kitchen RNCM will make referral to SW for assistance with PCS . Update 05/20/20:  Patient states she is now receiving PCS from [facility] 3 days a week for 4 hours each day. Marland Kitchen Update 05/27/20: BSW spoke with patient about PCS, she stated someone usually comes in 3 days a week for a few hours. She stated her aide was off today. BSW contacted [place] and spoke with [facility], she stated that patient has 13 hours per week with 6 days a week, however patient chooses to have an aide come out 3 days a week so that she can have more hours. The aide assist with most ADL's. Marland Kitchen Update 06/04/20: Patient stated she is ready to move to [facility]. She has already started the process but wants to save some money first and she has to wait on her nephew who will drive the uhaul truck. Marland Kitchen Update 06/24/20: BSW contacted patient to follow up on her moving to [facility], she stated she is still saving money. Patient did cut call short because she was sleeping, patient stated she was not in need of any resources at this time.  Interventions: . Inter-disciplinary care team collaboration (see longitudinal plan of care) . Referred patient to community resources care guide team for assistance with transportation. . Provided patient with information  about [employer]. . Discussed plans with patient for ongoing care management follow up and provided patient with direct contact information for care management team . Advised patient to call [place]. Marland Kitchen Collaborated with [facility] regarding transportation. . Assisted patient/caregiver with obtaining information about health plan  benefits . Collaborated with Social Work regarding [employer]. Marland Kitchen Update 06/14/20:  Patient continues to utilize [employer], aide, [employer] as needed-has missed appointments due to transportation "no shows"-will continue to follow.  Patient Self Care Activities:  . Patient will call [employer]. Marland Kitchen Update 06/07/20:  Patient continues to utilize [facility] with success. Marland Kitchen Update 06/14/20:  Patient has missed appointments due to transportation no shows.  [facility] transportation has been utilized as needed.      Task: Alleviate Barriers to Mental Health Treatment   Note:   Care Management Activities:    - reassurance provided    Notes:    Patient Care Plan: General Plan of Care (Adult)    Problem Identified: Health Promotion or Disease Self-Management (General Plan of Care)   Priority: High  Onset Date: 04/23/2020    Long-Range Goal: Self-Management Plan Developed   Start Date: 04/23/2020  Expected End Date: 07/22/2020  Recent Progress: Not on track  Priority: Medium  Note:   Current Barriers:  . Chronic Disease Management support and education needs.  Patient with abdominal wall infection-seen at [facility] ED 05/24/20 - I&D performed. Marland Kitchen Update 06/01/20:  Patient states she is improving-HHRN is cleaning and dressing infection. Marland Kitchen Update 06/07/20:  Infection continues to improve per patient, HHRN continues to clean and dress infection site. Marland Kitchen Update 06/17/20:  Infection improved, healed.  Nurse Case Manager Clinical Goal(s):  Marland Kitchen Over the next 30 days, patient will attend all scheduled medical appointments:  . Over the next 30 days, patient will work with CM team pharmacist to review medications. Marland Kitchen Update 05/20/20:  Patient has appointment with Pharmacist 05/21/20. Marland Kitchen Update 05/25/20:  Patient declines to speak with Pharmacist at this time.  Interventions:  . Inter-disciplinary care team collaboration (see longitudinal plan of care) . Evaluation of  current treatment plan and patient's adherence to plan as established by provider. . Reviewed medications with patient. [name] with pharmacist regarding medications. . Discussed plans with patient for ongoing care management follow up and provided patient with direct contact information for care management team . Reviewed scheduled/upcoming provider appointments.  Marland Kitchen Update 05/20/20:  Patient unable to attend PT appointment 05/19/20, has next appointment 05/26/20.  Has Cardiology appointment 05/28/20.  Reviewed [place] information with patient again and with aide. Marland Kitchen Update 06/07/20:  Patient has PT appointment 06/09/20. Marland Kitchen Update 06/17/20:  patient attended PT appointment 06/16/20 and also attended appointment at [facility] this week-patient states she feels better. Marland Kitchen Pharmacy referral for medication review. Marland Kitchen Update 05/24/20: Pharmacist has attempted to speak with patient and has been unable to.  Patient at Urgent Care today with stomach pain.  RNCM will reschedule appointment with patient and speak with her regarding importance of speaking with Pharmacist. . Update 05/25/20: Patient declines to speak with Pharmacist at this time.  Patient Goals/Self-Care Activities Over the next 30 days, patient will:  -Attends all scheduled provider appointments Calls pharmacy for medication refills Calls provider office for new concerns or questions  Follow Up Plan: The Managed Medicaid care management team will reach out to the patient again over the next 30 days.  The patient has been provided with contact information for the Managed Medicaid care  management team and has been advised to call with any health related questions or concerns.       Task: Mutually Develop and Royce Macadamia Achievement of Patient Goals   Note:   Care Management Activities:    - verbalization of feelings encouraged    Notes:    Patient Care Plan: Medication Management    Problem Identified: Health Promotion  or Disease Self-Management (General Plan of Care)     Goal: Medication Management   Note:   Current Barriers:  . Does not adhere to prescribed medication regimen . Does not maintain contact with provider office . Does not contact provider office for questions/concerns .   Pharmacist Clinical Goal(s):  Marland Kitchen Over the next 30 days, patient will achieve adherence to monitoring guidelines and medication adherence to achieve therapeutic efficacy . contact provider office for questions/concerns as evidenced notation of same in electronic health record through collaboration with PharmD and provider.  .   Interventions: . Inter-disciplinary care team collaboration (see longitudinal plan of care) . Comprehensive medication review performed; medication list updated in electronic medical record  @RXCPDIABETES @ @RXCPHYPERTENSION @ @RXCPHYPERLIPIDEMIA @  Patient Goals/Self-Care Activities . Over the next 30 days, patient will:  - take medications as prescribed collaborate with provider on medication access solutions  Follow Up Plan: The care management team will reach out to the patient again over the next 30 days.     Task: Mutually Develop and Royce Macadamia Achievement of Patient Goals   Note:   Care Management Activities:    - verbalization of feelings encouraged    Notes:

## 2020-08-02 NOTE — Patient Outreach (Signed)
DM Patient called asking for new meter, says it isn't working anymore. Plan: Called PCP to get new Meter, TS, and Lancets sent in. Called Christy Sartorius at Pharmacy and asked him to deliver this afternoon. Called patient back to explain plan. Christy Sartorius assured me someone would show her how to use new monitor because it's a new design

## 2020-08-03 DIAGNOSIS — Z86010 Personal history of colonic polyps: Secondary | ICD-10-CM | POA: Diagnosis not present

## 2020-08-03 DIAGNOSIS — Z6841 Body Mass Index (BMI) 40.0 and over, adult: Secondary | ICD-10-CM | POA: Diagnosis not present

## 2020-08-03 DIAGNOSIS — K5909 Other constipation: Secondary | ICD-10-CM | POA: Diagnosis not present

## 2020-08-03 DIAGNOSIS — R1013 Epigastric pain: Secondary | ICD-10-CM | POA: Diagnosis not present

## 2020-08-03 DIAGNOSIS — D649 Anemia, unspecified: Secondary | ICD-10-CM | POA: Diagnosis not present

## 2020-08-03 DIAGNOSIS — K921 Melena: Secondary | ICD-10-CM | POA: Diagnosis not present

## 2020-08-03 DIAGNOSIS — Z7689 Persons encountering health services in other specified circumstances: Secondary | ICD-10-CM | POA: Diagnosis not present

## 2020-08-05 DIAGNOSIS — H5213 Myopia, bilateral: Secondary | ICD-10-CM | POA: Diagnosis not present

## 2020-08-08 DIAGNOSIS — Z419 Encounter for procedure for purposes other than remedying health state, unspecified: Secondary | ICD-10-CM | POA: Diagnosis not present

## 2020-08-10 ENCOUNTER — Other Ambulatory Visit: Payer: Self-pay | Admitting: Obstetrics and Gynecology

## 2020-08-10 NOTE — Patient Instructions (Signed)
Visit Information  Ms. Kalianna Goers  - as a part of your Medicaid benefit, you are eligible for care management and care coordination services at no cost or copay. I was unable to reach you by phone today but would be happy to help you with your health related needs. Please feel free to call me at [phone].  A member of the Managed Medicaid care management team will reach out to you again over the next 7 days.   Melenie Minniear RN, BSN [place]  [facility] Care Management Coordinator - Managed Medicaid High Risk [phone].   

## 2020-08-10 NOTE — Patient Outreach (Signed)
Care Coordination  08/10/2020  Michele Owens 08-05-1960 [id number]    Medicaid Managed Care   Unsuccessful Outreach Note  08/10/2020 Name: Michele Owens MRN: [id number] DOB: 10-06-1960  Referred by: Everrett Coombe, MD Reason for referral : High Risk Managed Medicaid (Unsuccessful telephone outreach)   An unsuccessful telephone outreach was attempted today. The patient was referred to the case management team for assistance with care management and care coordination.   Follow Up Plan:  A member of the Managed Medicaid care management team will reach out to the patient again over the next 7 days.   [name] RN, BSN [place]  [facility] - Managed Medicaid High Risk [phone].

## 2020-08-11 ENCOUNTER — Other Ambulatory Visit: Payer: Self-pay | Admitting: Obstetrics and Gynecology

## 2020-08-11 NOTE — Patient Outreach (Signed)
Care Coordination  08/11/2020  Michele Owens 1961/02/24 [id number]   RNCM returned patient's phone call-no answer-left message.  [name] RN, BSN [place]  [facility] - Managed Medicaid High Risk [phone].

## 2020-08-12 ENCOUNTER — Other Ambulatory Visit: Payer: Self-pay | Admitting: Obstetrics and Gynecology

## 2020-08-12 NOTE — Patient Outreach (Signed)
Care Coordination  08/12/2020  Michele Owens 10/08/1960 [id number]   RNCM returned patient's phone call-no answer, left message.  [name] RN, BSN [place]  [facility] - Managed Medicaid High Risk [phone].

## 2020-08-13 ENCOUNTER — Encounter (HOSPITAL_COMMUNITY): Payer: Self-pay

## 2020-08-13 ENCOUNTER — Other Ambulatory Visit: Payer: Self-pay

## 2020-08-13 ENCOUNTER — Emergency Department (HOSPITAL_COMMUNITY): Payer: Medicaid Other

## 2020-08-13 ENCOUNTER — Other Ambulatory Visit: Payer: Self-pay | Admitting: Obstetrics and Gynecology

## 2020-08-13 ENCOUNTER — Emergency Department (HOSPITAL_COMMUNITY)
Admission: EM | Admit: 2020-08-13 | Discharge: 2020-08-14 | Disposition: A | Discharge status: Home / Self Care | Payer: Medicaid Other | Attending: Emergency Medicine | Admitting: Emergency Medicine

## 2020-08-13 DIAGNOSIS — M25561 Pain in right knee: Secondary | ICD-10-CM | POA: Insufficient documentation

## 2020-08-13 DIAGNOSIS — I110 Hypertensive heart disease with heart failure: Secondary | ICD-10-CM | POA: Diagnosis not present

## 2020-08-13 DIAGNOSIS — R0602 Shortness of breath: Secondary | ICD-10-CM | POA: Insufficient documentation

## 2020-08-13 DIAGNOSIS — R079 Chest pain, unspecified: Secondary | ICD-10-CM | POA: Diagnosis not present

## 2020-08-13 DIAGNOSIS — R0902 Hypoxemia: Secondary | ICD-10-CM | POA: Diagnosis not present

## 2020-08-13 DIAGNOSIS — Z79899 Other long term (current) drug therapy: Secondary | ICD-10-CM | POA: Insufficient documentation

## 2020-08-13 DIAGNOSIS — E119 Type 2 diabetes mellitus without complications: Secondary | ICD-10-CM | POA: Insufficient documentation

## 2020-08-13 DIAGNOSIS — Z7984 Long term (current) use of oral hypoglycemic drugs: Secondary | ICD-10-CM | POA: Insufficient documentation

## 2020-08-13 DIAGNOSIS — Z85850 Personal history of malignant neoplasm of thyroid: Secondary | ICD-10-CM | POA: Insufficient documentation

## 2020-08-13 DIAGNOSIS — G8929 Other chronic pain: Secondary | ICD-10-CM | POA: Diagnosis not present

## 2020-08-13 DIAGNOSIS — J45909 Unspecified asthma, uncomplicated: Secondary | ICD-10-CM | POA: Diagnosis not present

## 2020-08-13 DIAGNOSIS — R6889 Other general symptoms and signs: Secondary | ICD-10-CM | POA: Diagnosis not present

## 2020-08-13 DIAGNOSIS — J441 Chronic obstructive pulmonary disease with (acute) exacerbation: Secondary | ICD-10-CM | POA: Insufficient documentation

## 2020-08-13 DIAGNOSIS — I517 Cardiomegaly: Secondary | ICD-10-CM | POA: Diagnosis not present

## 2020-08-13 DIAGNOSIS — M25562 Pain in left knee: Secondary | ICD-10-CM | POA: Diagnosis not present

## 2020-08-13 DIAGNOSIS — I5032 Chronic diastolic (congestive) heart failure: Secondary | ICD-10-CM | POA: Insufficient documentation

## 2020-08-13 DIAGNOSIS — Z87891 Personal history of nicotine dependence: Secondary | ICD-10-CM | POA: Insufficient documentation

## 2020-08-13 DIAGNOSIS — J9811 Atelectasis: Secondary | ICD-10-CM | POA: Diagnosis not present

## 2020-08-13 DIAGNOSIS — I10 Essential (primary) hypertension: Secondary | ICD-10-CM | POA: Diagnosis not present

## 2020-08-13 DIAGNOSIS — M7989 Other specified soft tissue disorders: Secondary | ICD-10-CM | POA: Diagnosis not present

## 2020-08-13 DIAGNOSIS — Z743 Need for continuous supervision: Secondary | ICD-10-CM | POA: Diagnosis not present

## 2020-08-13 DIAGNOSIS — M47814 Spondylosis without myelopathy or radiculopathy, thoracic region: Secondary | ICD-10-CM | POA: Diagnosis not present

## 2020-08-13 LAB — COMPREHENSIVE METABOLIC PANEL
ALT: 19 U/L (ref 0–44)
AST: 17 U/L (ref 15–41)
Albumin: 4.2 g/dL (ref 3.5–5.0)
Alkaline Phosphatase: 81 U/L (ref 38–126)
Anion gap: 14 (ref 5–15)
BUN: 15 mg/dL (ref 6–20)
CO2: 29 mmol/L (ref 22–32)
Calcium: 9.1 mg/dL (ref 8.9–10.3)
Chloride: 100 mmol/L (ref 98–111)
Creatinine, Ser: 0.79 mg/dL (ref 0.44–1.00)
GFR, Estimated: >60 mL/min (ref >60)
Glucose, Bld: 92 mg/dL (ref 70–99)
Potassium: 4 mmol/L (ref 3.5–5.1)
Sodium: 143 mmol/L (ref 135–145)
Total Bilirubin: 0.4 mg/dL (ref 0.3–1.2)
Total Protein: 8 g/dL (ref 6.5–8.1)

## 2020-08-13 LAB — BRAIN NATRIURETIC PEPTIDE: B Natriuretic Peptide: 39.4 pg/mL (ref 0.0–100.0)

## 2020-08-13 LAB — CBC WITH DIFFERENTIAL/PLATELET
Abs Immature Granulocytes: 0.12 10*3/uL — ABNORMAL HIGH (ref 0.00–0.07)
Basophils Absolute: 0.1 10*3/uL (ref 0.0–0.1)
Basophils Relative: 0 %
Eosinophils Absolute: 0.3 10*3/uL (ref 0.0–0.5)
Eosinophils Relative: 2 %
HCT: 39.3 % (ref 36.0–46.0)
Hemoglobin: 12.1 g/dL (ref 12.0–15.0)
Immature Granulocytes: 1 %
Lymphocytes Relative: 19 %
Lymphs Abs: 3.3 10*3/uL (ref 0.7–4.0)
MCH: 28.5 pg (ref 26.0–34.0)
MCHC: 30.8 g/dL (ref 30.0–36.0)
MCV: 92.7 fL (ref 80.0–100.0)
Monocytes Absolute: 1 10*3/uL (ref 0.1–1.0)
Monocytes Relative: 6 %
Neutro Abs: 12.4 10*3/uL — ABNORMAL HIGH (ref 1.7–7.7)
Neutrophils Relative %: 72 %
Platelets: 291 10*3/uL (ref 150–400)
RBC: 4.24 MIL/uL (ref 3.87–5.11)
RDW: 14.6 % (ref 11.5–15.5)
WBC: 17.1 10*3/uL — ABNORMAL HIGH (ref 4.0–10.5)
nRBC: 0 % (ref 0.0–0.2)

## 2020-08-13 LAB — TROPONIN I (HIGH SENSITIVITY): Troponin I (High Sensitivity): 5 ng/L (ref <18)

## 2020-08-13 LAB — CBG MONITORING, ED: Glucose-Capillary: 89 mg/dL (ref 70–99)

## 2020-08-13 MED ORDER — HYDROMORPHONE HCL 1 MG/ML IJ SOLN
1.0000 mg | Freq: Once | INTRAMUSCULAR | Status: AC
  Administered 2020-08-13: 1 mg via INTRAVENOUS
  Filled 2020-08-13: qty 1

## 2020-08-13 MED ORDER — FUROSEMIDE 10 MG/ML IJ SOLN
40.0000 mg | Freq: Once | INTRAMUSCULAR | Status: AC
  Administered 2020-08-13: 40 mg via INTRAVENOUS
  Filled 2020-08-13: qty 4

## 2020-08-13 NOTE — ED Provider Notes (Addendum)
[facility] DEPT Provider Note   CSN: [id number] Arrival date & time: 08/13/20  1602     History Chief Complaint  Patient presents with  . Knee Pain    Michele Owens is a 60 y.o. female.  HPI 60 year old female presents with a chief complaint of knee/leg pain. She also endorses multiple other complaints. Has chronic knee pain and the left knee has been progressively worsening.  It feels swollen.  Both of her legs also feel swollen and she is worried she has too much fluid.  Has shortness of breath and on and off chest pain as well.  No fevers during this time.  The pain and swelling is so bad that she cannot walk.  No weakness or numbness.  Pain is rated as severe.  Past Medical History:  Diagnosis Date  . Anxiety   . Arthritis   . Asthma   . Chronic diastolic CHF (congestive heart failure) ([place])   . COPD (chronic obstructive pulmonary disease) (HCC)    Uses Oxygen at night  . Depression   . Diabetes mellitus without complication ([place])   . Dyspnea    occ  . Family history of thyroid cancer   . GERD (gastroesophageal reflux disease)   . Gout   . Headache    migraines  . History of DVT of lower extremity   . History of nuclear stress test    Myoview 2/17:  Low risk stress nuclear study with a small, moderate intensity, partially reversible inferior lateral defect consistent with small prior infarct and minimal peri-infarct ischemia; EF 68 with normal wall motion  . Hypertension   . Pneumonia   . Thyroid cancer ([place]) 09/23/2016    Patient Active Problem List   Diagnosis Date Noted  . Acute on chronic respiratory failure with hypoxia and hypercapnia ([place]) 04/18/2020  . Acute on chronic respiratory failure with hypoxia ([place]) 04/18/2020  . Hemoptysis 04/18/2020  . Acute metabolic encephalopathy 55/73/2202  . Acute encephalopathy 04/18/2020  . Chronic diastolic CHF (congestive heart failure) ([place]) 04/18/2020  . Sepsis ([place]) 04/18/2020  .  Morbid obesity with BMI of 50.0-59.9, adult ([place])   . Fall at home, initial encounter 06/23/2018  . HNP (herniated nucleus pulposus), thoracic 06/23/2018  . CHF (congestive heart failure) ([place]) 06/18/2018  . Hypertensive disorder 08/28/2017  . Obesity 08/28/2017  . COPD exacerbation ([place]) 08/04/2017  . Acute and chronic respiratory failure with hypercapnia ([place])   . Pulmonary HTN ([place])   . Genetic testing 02/27/2017  . Family history of thyroid cancer   . Hypomagnesemia 10/03/2016  . Atypical chest pain   . HLD (hyperlipidemia) 09/23/2016  . Gout 09/23/2016  . DVT (deep vein thrombosis) in pregnancy 09/23/2016  . Thyroid cancer ([place]) 09/23/2016  . Hypocalcemia 09/23/2016  . AKI (acute kidney injury) ([place]) 09/23/2016  . Acute pulmonary edema (HCC)   . Acute hypoxemic respiratory failure ([place])   . Ventilator dependent ([place])   . Chronic respiratory failure with hypoxia and hypercapnia ([place]) 07/16/2016  . CAP (community acquired pneumonia) 01/23/2016  . 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 Pulmonary hypertension ([place]) 01/23/2016  . Obesity hypoventilation syndrome ([place]) 08/26/2015  . Dyslipidemia associated with type 2 diabetes mellitus ([place]) 04/24/2015  . Leukocytosis 04/24/2015  . Controlled type 2 diabetes mellitus without complication, without long-term current use of insulin ([place]) 04/24/2015  . Anxiety 04/24/2015  . Benign essential HTN 04/24/2015  . Normocytic anemia 04/24/2015  . OSA (obstructive sleep  apnea) 05/10/2012  . Tobacco abuse 07/04/2011    Past Surgical History:  Procedure Laterality Date  . BUNIONECTOMY Bilateral   . CARDIAC CATHETERIZATION N/A 06/23/2015   Procedure: Right/Left Heart Cath and Coronary Angiography;  Surgeon: [name], MD;  Location: [facility];  Service: Cardiovascular;  Laterality: N/A;  . COLONOSCOPY WITH PROPOFOL N/A 12/15/2015   Procedure: COLONOSCOPY WITH PROPOFOL;  Surgeon: [name], MD;   Location: [facility];  Service: Endoscopy;  Laterality: N/A;  . RIGHT HEART CATH N/A 10/01/2019   Procedure: RIGHT HEART CATH;  Surgeon: [name], MD;  Location: [facility];  Service: Cardiovascular;  Laterality: N/A;  . THYROIDECTOMY  09/19/2016  . THYROIDECTOMY N/A 09/19/2016   Procedure: TOTAL THYROIDECTOMY;  Surgeon: [name], MD;  Location: [facility];  Service: General;  Laterality: N/A;  . TONSILLECTOMY    . TOTAL ABDOMINAL HYSTERECTOMY  07/14/10     OB History   No obstetric history on file.     Family History  Problem Relation Age of Onset  . Cancer Father        thought to be due to exposure to concrete  . Diabetes Mother   . Thyroid cancer Mother        dx in her 80s-60s  . Cancer Maternal Uncle        2 uncles with cancer NOS  . Brain cancer Paternal Aunt   . Cancer Cousin        maternal first cousin - NOS  . Cancer Cousin        maternal first cousin - NOS    Social History   Tobacco Use  . Smoking status: Former Smoker    Packs/day: 0.50    Years: 41.00    Pack years: 20.50    Types: Cigarettes    Quit date: 07/2016    Years since quitting: 4.0  . Smokeless tobacco: Never Used  . Tobacco comment: Starting to Wean Off -- using Nicotine Patch  Vaping Use  . Vaping Use: Never used  Substance Use Topics  . Alcohol use: Not Currently    Alcohol/week: 0.0 standard drinks    Comment: ? none now  . Drug use: Never    Comment: none for long time    Home Medications Prior to Admission medications   Medication Sig Start Date End Date Taking? Authorizing Provider  albuterol (PROVENTIL HFA;VENTOLIN HFA) 108 (90 Base) MCG/ACT inhaler Inhale 1-2 puffs into the lungs every 6 (six) hours as needed for wheezing or shortness of breath. 04/23/18  Yes Long, [name], MD  allopurinol (ZYLOPRIM) 100 MG tablet Take 100 mg by mouth daily.   Yes [provider]  ANORO ELLIPTA 62.5-25 MCG/INH AEPB Inhale 1 puff into the lungs daily.  04/11/18  Yes  [provider]  benzonatate (TESSALON) 200 MG capsule Take 1 capsule (200 mg total) by mouth in the morning, at noon, and at bedtime. 7 DS 04/13/20  Yes Regalado, Belkys A, MD  butalbital-acetaminophen-caffeine (FIORICET) 50-325-40 MG tablet Take 1 tablet by mouth 2 (two) times daily as needed for migraine. 08/13/20  Yes [provider]  calcitRIOL (ROCALTROL) 0.5 MCG capsule Take 1 capsule (0.5 mcg total) by mouth daily. 09/26/16  Yes Hongalgi, [name], MD  carvedilol (COREG) 6.25 MG tablet Take 1 tablet (6.25 mg total) by mouth 2 (two) times daily with a meal. 01/23/18  Yes Bensimhon, [name], MD  clonazePAM (KLONOPIN) 2 MG tablet Take 1 tablet (  2 mg total) by mouth 3 (three) times daily. for anxiety Patient taking differently: Take 2 mg by mouth 3 (three) times daily. 06/23/19  Yes [name], MD  cyclobenzaprine (FLEXERIL) 10 MG tablet Take 10 mg by mouth 3 (three) times daily. 11/19/19  Yes [provider]  EPINEPHrine 0.3 mg/0.3 mL IJ SOAJ injection Inject 0.3 mg into the muscle daily as needed for anaphylaxis.   Yes [provider]  FEROSUL 325 (65 Fe) MG tablet Take 325 mg by mouth daily. 03/22/17  Yes [provider]  guaiFENesin (MUCINEX) 600 MG 12 hr tablet Take 2 tablets (1,200 mg total) by mouth 2 (two) times daily. 04/13/20  Yes Regalado, Belkys A, MD  ipratropium-albuterol (DUONEB) 0.5-2.5 (3) MG/3ML SOLN Take 3 mLs by nebulization 4 (four) times daily.  04/05/17  Yes [provider]  levothyroxine (SYNTHROID, LEVOTHROID) 112 MCG tablet Take 224 mcg by mouth daily. 07/11/17  Yes [provider]  lisinopril (PRINIVIL,ZESTRIL) 5 MG tablet Take 1 tablet (5 mg total) by mouth daily. Please call for office visit [phone] Patient taking differently: Take 5 mg by mouth daily. 08/15/17  Yes [name], MD  loperamide (IMODIUM) 2 MG capsule Take 1 capsule (2 mg total) by mouth 4 (four) times daily as needed for diarrhea or loose  stools. 9/89/21  Yes [name], MD  metFORMIN (GLUCOPHAGE) 500 MG tablet Take 1 tablet (500 mg total) by mouth 2 (two) times daily with a meal. 12/11/14  Yes [name], MD  nitroGLYCERIN (NITROSTAT) 0.4 MG SL tablet Place 1 tablet (0.4 mg total) under the tongue every 5 (five) minutes as needed for chest pain. 06/19/18  Yes [name], MD  nystatin cream (MYCOSTATIN) Apply 1 application topically 2 (two) times daily as needed for dry skin. 04/13/20  Yes Regalado, Belkys A, MD  omeprazole (PRILOSEC) 20 MG capsule Take 20 mg by mouth 2 (two) times daily before a meal. 11/19/19  Yes [provider]  oxyCODONE-acetaminophen (PERCOCET) 10-325 MG tablet Take 1 tablet by mouth every 6 (six) hours as needed for pain. 12/03/19  Yes [provider]  OXYGEN Inhale 2-3 L into the lungs continuous.   Yes [provider]  polyethylene glycol (MIRALAX / GLYCOLAX) 17 g packet Take 17 g by mouth daily as needed for mild constipation.   Yes [provider]  potassium chloride SA (K-DUR,KLOR-CON) 20 MEQ tablet Take 1 tablet (20 mEq total) by mouth 2 (two) times daily. 07/24/18  Yes [name], MD  QC STOOL SOFTENER PLS LAXATIVE 8.6-50 MG tablet Take 1 tablet by mouth at bedtime. 04/11/17  Yes [provider]  rosuvastatin (CRESTOR) 10 MG tablet Take 10 mg by mouth every evening. 11/19/19  Yes [provider]  torsemide (DEMADEX) 20 MG tablet TAKE 4 (FOUR) TABLETS BY MOUTH DAILY (2AM+ 2NOON) Patient taking differently: Take 40 mg by mouth 2 (two) times daily. 05/14/20  Yes [name], MD  traZODone (DESYREL) 50 MG tablet Take 100 mg by mouth at bedtime. 11/19/19  Yes [provider]  Blood Glucose Monitoring Suppl (ACCU-CHEK AVIVA PLUS) w/Device KIT 1 each by Other route daily.  08/07/17   [provider]  cefdinir (OMNICEF) 300 MG capsule TAKE 1 CAPSULE (300 MG TOTAL) BY MOUTH TWO TIMES DAILY FOR 4 DAYS. Patient not taking: No sig  reported 04/20/20 04/20/21  [name], MD  diclofenac sodium (VOLTAREN) 1 % GEL Apply 2 g topically 4 (four) times daily as needed (  for pain). Patient not taking: No sig reported 06/19/18   [name], MD  glucose blood test strip Use as instructed to check blood sugar up to 4 times daily Patient taking differently: 1 each by Other route See admin instructions. Use as instructed to check blood sugar up to 4 times daily 08/06/17   [name], DO    Allergies    Bee venom, Fioricet [butalbital-apap-caffeine], Ibuprofen, Lamisil [terbinafine], and Nsaids  Review of Systems   Review of Systems  Constitutional: Negative for fever.  Respiratory: Positive for shortness of breath.   Cardiovascular: Positive for chest pain and leg swelling.  Musculoskeletal: Positive for arthralgias and joint swelling.  Skin: Negative for color change.  Neurological: Negative for weakness and numbness.  All other systems reviewed and are negative.   Physical Exam Updated Vital Signs BP 128/76   Pulse 87   Temp 98.2 F (36.8 C) (Oral)   Resp (!) 21   Ht '5\' 5"'  (1.651 m)   Wt 135.2 kg   SpO2 92%   BMI 49.59 kg/m   Physical Exam Vitals and nursing note reviewed.  Constitutional:      General: She is not in acute distress.    Appearance: She is well-developed. She is obese. She is not ill-appearing or diaphoretic.  HENT:     Head: Normocephalic and atraumatic.     Right Ear: External ear normal.     Left Ear: External ear normal.     Nose: Nose normal.  Eyes:     General:        Right eye: No discharge.        Left eye: No discharge.  Cardiovascular:     Rate and Rhythm: Normal rate and regular rhythm.     Pulses:          Dorsalis pedis pulses are 2+ on the right side and 2+ on the left side.     Heart sounds: Normal heart sounds.  Pulmonary:     Effort: Pulmonary effort is normal.     Breath sounds: Normal breath sounds.  Abdominal:     Palpations: Abdomen is soft.      Tenderness: There is no abdominal tenderness.  Musculoskeletal:     Left knee: Swelling (questionable swelling) present. No erythema or ecchymosis. Decreased range of motion (is painful). Tenderness present.     Comments: Hard to appreciate significant swelling in legs due to obesity  Skin:    General: Skin is warm and dry.     Findings: No erythema.  Neurological:     Mental Status: She is alert.  Psychiatric:        Mood and Affect: Mood is not anxious.     ED Results / Procedures / Treatments   Labs (all labs ordered are listed, but only abnormal results are displayed) Labs Reviewed  CBC WITH DIFFERENTIAL/PLATELET - Abnormal; Notable for the following components:      Result Value   WBC 17.1 (*)    Neutro Abs 12.4 (*)    Abs Immature Granulocytes 0.12 (*)    All other components within normal limits  COMPREHENSIVE METABOLIC PANEL  BRAIN NATRIURETIC PEPTIDE  CBG MONITORING, ED  TROPONIN I (HIGH SENSITIVITY)    EKG EKG Interpretation  Date/Time:  Friday Aug 13 2020 18:55:55 EDT Ventricular Rate:  88 PR Interval:  154 QRS Duration: 85 QT Interval:  379 QTC Calculation: 459 R Axis:   48 Text Interpretation: Sinus rhythm Consider left atrial enlargement  Low voltage, precordial leads Borderline T abnormalities, anterior leads nonspecific T waves are similar to Mar 2022 Confirmed by Sherwood Gambler [phone]) on 08/13/2020 7:24:43 PM   Radiology DG Chest 2 View  Result Date: 08/13/2020 CLINICAL DATA:  Bilateral knee pain. EXAM: CHEST - 2 VIEW COMPARISON:  June 22, 2020 FINDINGS: Stable mild to moderate severity areas of bibasilar scarring and/or atelectasis are seen. There is no evidence of a pleural effusion or pneumothorax. The cardiac silhouette is moderately enlarged. Degenerative changes seen throughout the thoracic spine. IMPRESSION: Stable cardiomegaly with mild to moderate severity bibasilar scarring and/or atelectasis. Electronically Signed   By: Virgina Norfolk M.D.    On: 08/13/2020 20:01   DG Knee Complete 4 Views Left  Result Date: 08/13/2020 CLINICAL DATA:  Atraumatic bilateral knee pain and swelling. EXAM: LEFT KNEE - COMPLETE 4+ VIEW COMPARISON:  None. FINDINGS: No evidence of acute fracture or dislocation. Mild medial and lateral tibiofemoral compartment space narrowing is seen. Mild patellofemoral narrowing is also noted. There is a small joint effusion. IMPRESSION: Small joint effusion without an acute osseous abnormality. Electronically Signed   By: Virgina Norfolk M.D.   On: 08/13/2020 20:03    Procedures Procedures   Medications Ordered in ED Medications  HYDROmorphone (DILAUDID) injection 1 mg (1 mg Intravenous Given 08/13/20 1854)  furosemide (LASIX) injection 40 mg (40 mg Intravenous Given 08/13/20 2202)    ED Course  I have reviewed the triage vital signs and the nursing notes.  Pertinent labs & imaging results that were available during my care of the patient were reviewed by me and considered in my medical decision making (see chart for details).    MDM Rules/Calculators/A&P                          Patient presents with multiple acute on chronic symptoms including her leg swelling and knee pain.  On x-ray she does have a small joint effusion.  It is not red or hot on her joint and she has no fever.  I did discuss trying to aspirate but she declines.  She is asking for Lasix as she knows this will make her feel better for both her knee and her general feeling.  She was given a dose of Lasix and she has been ambulatory in the room.  Given this I think is reasonable to discharge her home as he seemed to be more acute on chronic issues then any emergent conditions.  She does have a leukocytosis but this appears to be chronic based on multiple previous labs. Highly doubt septic knee. Otherwise lab work is benign.  Will discharge home with return precautions. Final Clinical Impression(s) / ED Diagnoses Final diagnoses:  Chronic pain of both  knees  Leg swelling    Rx / DC Orders ED Discharge Orders    None       Sherwood Gambler, MD 08/13/20 6045    Sherwood Gambler, MD 08/13/20 2302

## 2020-08-13 NOTE — ED Notes (Signed)
Patient transported to X-ray 

## 2020-08-13 NOTE — ED Notes (Signed)
PT[facility] called for pt transport. 

## 2020-08-13 NOTE — ED Triage Notes (Signed)
Per EMS- patient c/o bilateral knee pain since waking this AM. Patient denies any falls or other injuries. Patient reports a history of gout, arthritis.

## 2020-08-13 NOTE — Discharge Instructions (Signed)
If you develop fever, trouble breathing, new or worsening swelling in your legs/knees, chest pain, or any other new/concerning symptoms then return to the ER or call 911.

## 2020-08-13 NOTE — ED Notes (Signed)
Pt has gotten up to bedside commode with use of her cane. No concerns identified by this nurse

## 2020-08-13 NOTE — Patient Outreach (Signed)
08/13/20: worker was supposed to show up at 68, patient called me at 44 stating that the woman never came. While patient was speaking with me, Rosanne Sack, the worker, came late. Her [employer] Popper ride showed up late and blew tire. Plan: Will f/u as scheduled

## 2020-08-13 NOTE — ED Provider Notes (Signed)
Emergency Medicine Provider Triage Evaluation Note  Michele Owens , a 60 y.o. female  was evaluated in triage.  Pt complains of knees pain.  Review of Systems  Positive: Pain to both knees Negative: Trauma, fever, rash  Physical Exam  BP 113/70 (BP Location: Left Arm)   Pulse 93   Temp 98.2 F (36.8 C) (Oral)   Resp 20   Ht 5\' 5"  (1.651 m)   SpO2 96%   BMI 50.42 kg/m  Gen:   Awake, obese, speech is slow, wearing supplemental O2 Resp:  Normal effort  MSK:   Moves extremities with difficulty, tenderness to bilateral knees without erythema or edema  Medical Decision Making  Medically screening exam initiated at 4:40 PM.  Appropriate orders placed.  Michele Owens was informed that the remainder of the evaluation will be completed by another provider, this initial triage assessment does not replace that evaluation, and the importance of remaining in the ED until their evaluation is complete.  Acute on chronic bilateral knee pain for weeks, not adequately controlled with home medication including voltaren gel.  No fever, no recent injury. Walks with cane.    Domenic Moras, PA-C 08/13/20 1642    Sherwood Gambler, MD 08/13/20 [phone]

## 2020-08-13 NOTE — Patient Outreach (Signed)
Care Coordination  08/13/2020  Michele Owens 01-06-1961 [id number]   RNCM returned patient's phone call.  RNCM answered patient's questions.  [name] RN, BSN [name]  [facility] - Managed Medicaid High Risk [phone].

## 2020-08-14 DIAGNOSIS — Z7689 Persons encountering health services in other specified circumstances: Secondary | ICD-10-CM | POA: Diagnosis not present

## 2020-08-14 NOTE — ED Notes (Signed)
[facility] present for pt transport 

## 2020-08-16 ENCOUNTER — Encounter (HOSPITAL_COMMUNITY): Payer: Self-pay

## 2020-08-16 ENCOUNTER — Ambulatory Visit (HOSPITAL_COMMUNITY)
Admission: EM | Admit: 2020-08-16 | Discharge: 2020-08-16 | Disposition: A | Discharge status: Home / Self Care | Payer: Medicaid Other

## 2020-08-16 ENCOUNTER — Other Ambulatory Visit: Payer: Self-pay

## 2020-08-16 ENCOUNTER — Ambulatory Visit: Payer: Medicaid Other | Admitting: Podiatry

## 2020-08-16 ENCOUNTER — Other Ambulatory Visit: Payer: Self-pay | Admitting: Obstetrics and Gynecology

## 2020-08-16 ENCOUNTER — Telehealth: Payer: Self-pay | Admitting: Internal Medicine

## 2020-08-16 ENCOUNTER — Encounter: Payer: Self-pay | Admitting: Podiatry

## 2020-08-16 DIAGNOSIS — M79675 Pain in left toe(s): Secondary | ICD-10-CM

## 2020-08-16 DIAGNOSIS — M25562 Pain in left knee: Secondary | ICD-10-CM

## 2020-08-16 DIAGNOSIS — B351 Tinea unguium: Secondary | ICD-10-CM

## 2020-08-16 DIAGNOSIS — E1142 Type 2 diabetes mellitus with diabetic polyneuropathy: Secondary | ICD-10-CM | POA: Diagnosis not present

## 2020-08-16 DIAGNOSIS — G8929 Other chronic pain: Secondary | ICD-10-CM | POA: Diagnosis not present

## 2020-08-16 DIAGNOSIS — E0843 Diabetes mellitus due to underlying condition with diabetic autonomic (poly)neuropathy: Secondary | ICD-10-CM | POA: Diagnosis not present

## 2020-08-16 DIAGNOSIS — M79674 Pain in right toe(s): Secondary | ICD-10-CM

## 2020-08-16 DIAGNOSIS — M722 Plantar fascial fibromatosis: Secondary | ICD-10-CM

## 2020-08-16 NOTE — Telephone Encounter (Signed)
   Anushri Casalino DOB: [birth year] MRN: [id number]   RIDER WAIVER AND RELEASE OF LIABILITY  For purposes of improving physical access to our facilities, [facility] is pleased to partner with third parties to provide [facility] patients or other authorized individuals the option of convenient, on-demand ground transportation services (the [place]") through use of the technology service that enables users to request on-demand ground transportation from independent third-party providers.  By opting to use and accept these [employer], I, the undersigned, hereby agree on behalf of myself, and on behalf of any minor child using the [employer] for whom I am the parent or legal guardian, as follows:  1. Government social research officer provided to me are provided by independent third-party transportation providers who are not [employer] or employees and who are unaffiliated with [employer]. 2. [facility] is neither a transportation carrier nor a common or public carrier. 3. [facility] has no control over the quality or safety of the transportation that occurs as a result of the [employer]. 4. [facility] cannot guarantee that any third-party transportation provider will complete any arranged transportation service. 5. [facility] makes no representation, warranty, or guarantee regarding the reliability, timeliness, quality, safety, suitability, or availability of any of the Transport Services or that they will be error free. 6. I fully understand that traveling by vehicle involves risks and dangers of serious bodily injury, including permanent disability, paralysis, and death. I agree, on behalf of myself and on behalf of any minor child using the Transport Services for whom I am the parent or legal guardian, that the entire risk arising out of my use of the [employer] remains solely with me, to the maximum extent permitted under applicable law. 7. The [employer] are provided "as is" and "as available." [facility] disclaims all representations and warranties, express, implied or statutory, not expressly set out in these terms, including the implied warranties of merchantability and fitness for a particular purpose. 8. I hereby waive and release [facility], its agents, employees, officers, directors, representatives, insurers, attorneys, assigns, successors, subsidiaries, and affiliates from any and all past, present, or future claims, demands, liabilities, actions, causes of action, or suits of any kind directly or indirectly arising from acceptance and use of the [employer]. 9. I further waive and release [facility] and its affiliates from all present and future liability and responsibility for any injury or death to persons or damages to property caused by or related to the use of the [employer]. 10. I have read this Waiver and Release of Liability, and I understand the terms used in it and their legal significance. This Waiver is freely and voluntarily given with the understanding that my right (as well as the right of any minor child for whom I am the parent or legal guardian using the [employer]) to legal recourse against [facility] in connection with the [employer] is knowingly surrendered in return for use of these services.   I attest that I read the consent document to [name], gave Ms. Schleicher the opportunity to ask questions and answered the questions asked (if any). I affirm that [name] then provided consent for she's participation in this program.     Legrand Pitts

## 2020-08-16 NOTE — Patient Instructions (Signed)
[facility] [name] ,  [place]  [address]  Main: [phone] 

## 2020-08-16 NOTE — ED Triage Notes (Addendum)
Pt with c/o pain in left knee and both feet ongoing since Friday which pt states is due to a fall. Pt states she got up to use the bathroom in the night and tripped over her oxygen machine.   Reports history of gout and arthritis and states she is taking gout medicine but that "I need something for arthritis".   Pt reports she is taking lasix but this is not on her medication list. Does not know dosage. Pt unsure of home medications so unable to update list at this time.

## 2020-08-16 NOTE — Patient Outreach (Signed)
Care Coordination  08/16/2020  Alizah Sills 06-20-1960 [id number]   RNCM returned patient's phone call.  Patient states she has an appointment at [facility] today and needs transportation.  RNCM called [employer] and arranged transportation to appointment this afternoon-appointment is scheduled at 345, patient to be picked up at 315.  Patient consented to ride-waiver read to patient over the phone and patient consented to ride.  Patient given [employer] phone number for future transportation needs  to appointments with [facility] providers and facilities.  Patient also given [facility] Medicaid plan information phone number and  [facility] transportation number again.  All questions answered.  [name] RN, BSN [facility]  [employer] - Managed Medicaid High Risk [phone].

## 2020-08-16 NOTE — ED Provider Notes (Signed)
[facility]    CSN: [id number] Arrival date & time: 08/16/20  1706      History   Chief Complaint Chief Complaint  Patient presents with  . Fall  . Foot Pain  . Knee Pain    HPI Michele Owens is a 60 y.o. female.   Patient presents with pain in the left knee that has been worsening over the last 3 days due to her arthritis.  Pain makes it difficult for her to walk, and she endorses that she has been following more frequently at home.  Currently using a cane today.  Denies numbness, tingling and urinary changes.  Has been taking prescribed medications of diclofenac gel, Flexeril, Percocet with no relief.  Attempted an appointment with primary care but says they did not answer the phone or call her back.  Recently seen in the emergency department on 08/13/2020 for similar complaints.  Requesting a Lasix injection and steroid injection.  Past Medical History:  Diagnosis Date  . Anxiety   . Arthritis   . Asthma   . Chronic diastolic CHF (congestive heart failure) ([place])   . COPD (chronic obstructive pulmonary disease) (HCC)    Uses Oxygen at night  . Depression   . Diabetes mellitus without complication ([place])   . Dyspnea    occ  . Family history of thyroid cancer   . GERD (gastroesophageal reflux disease)   . Gout   . Headache    migraines  . History of DVT of lower extremity   . History of nuclear stress test    Myoview 2/17:  Low risk stress nuclear study with a small, moderate intensity, partially reversible inferior lateral defect consistent with small prior infarct and minimal peri-infarct ischemia; EF 68 with normal wall motion  . Hypertension   . Pneumonia   . Thyroid cancer ([place]) 09/23/2016    Patient Active Problem List   Diagnosis Date Noted  . Acute on chronic respiratory failure with hypoxia and hypercapnia ([place]) 04/18/2020  . Acute on chronic respiratory failure with hypoxia ([place]) 04/18/2020  . Hemoptysis 04/18/2020  . Acute metabolic encephalopathy  75/88/3254  . Acute encephalopathy 04/18/2020  . Chronic diastolic CHF (congestive heart failure) ([place]) 04/18/2020  . Sepsis ([place]) 04/18/2020  . Morbid obesity with BMI of 50.0-59.9, adult ([place])   . Fall at home, initial encounter 06/23/2018  . HNP (herniated nucleus pulposus), thoracic 06/23/2018  . CHF (congestive heart failure) ([place]) 06/18/2018  . Hypertensive disorder 08/28/2017  . Obesity 08/28/2017  . COPD exacerbation ([place]) 08/04/2017  . Acute and chronic respiratory failure with hypercapnia ([place])   . Pulmonary HTN ([place])   . Genetic testing 02/27/2017  . Family history of thyroid cancer   . Hypomagnesemia 10/03/2016  . Atypical chest pain   . HLD (hyperlipidemia) 09/23/2016  . Gout 09/23/2016  . DVT (deep vein thrombosis) in pregnancy 09/23/2016  . Thyroid cancer ([place]) 09/23/2016  . Hypocalcemia 09/23/2016  . AKI (acute kidney injury) ([place]) 09/23/2016  . Acute pulmonary edema (HCC)   . Acute hypoxemic respiratory failure ([place])   . Ventilator dependent ([place])   . Chronic respiratory failure with hypoxia and hypercapnia ([place]) 07/16/2016  . CAP (community acquired pneumonia) 01/23/2016  . 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 Pulmonary hypertension ([place]) 01/23/2016  . Obesity hypoventilation syndrome ([place]) 08/26/2015  . Dyslipidemia associated with type 2 diabetes mellitus ([place]) 04/24/2015  . Leukocytosis 04/24/2015  . Controlled type 2 diabetes mellitus without complication, without long-term current  use of insulin ([place]) 04/24/2015  . Anxiety 04/24/2015  . Benign essential HTN 04/24/2015  . Normocytic anemia 04/24/2015  . OSA (obstructive sleep apnea) 05/10/2012  . Tobacco abuse 07/04/2011    Past Surgical History:  Procedure Laterality Date  . BUNIONECTOMY Bilateral   . CARDIAC CATHETERIZATION N/A 06/23/2015   Procedure: Right/Left Heart Cath and Coronary Angiography;  Surgeon: [name], MD;  Location: [facility];  Service:  Cardiovascular;  Laterality: N/A;  . COLONOSCOPY WITH PROPOFOL N/A 12/15/2015   Procedure: COLONOSCOPY WITH PROPOFOL;  Surgeon: [name], MD;  Location: [facility];  Service: Endoscopy;  Laterality: N/A;  . RIGHT HEART CATH N/A 10/01/2019   Procedure: RIGHT HEART CATH;  Surgeon: [name], MD;  Location: [facility];  Service: Cardiovascular;  Laterality: N/A;  . THYROIDECTOMY  09/19/2016  . THYROIDECTOMY N/A 09/19/2016   Procedure: TOTAL THYROIDECTOMY;  Surgeon: [name], MD;  Location: [facility];  Service: General;  Laterality: N/A;  . TONSILLECTOMY    . TOTAL ABDOMINAL HYSTERECTOMY  07/14/10    OB History   No obstetric history on file.      Home Medications    Prior to Admission medications   Medication Sig Start Date End Date Taking? Authorizing Provider  potassium chloride SA (K-DUR,KLOR-CON) 20 MEQ tablet Take 1 tablet (20 mEq total) by mouth 2 (two) times daily. 07/24/18  Yes [name], MD  torsemide (DEMADEX) 20 MG tablet TAKE 4 (FOUR) TABLETS BY MOUTH DAILY (2AM+ 2NOON) Patient taking differently: Take 40 mg by mouth 2 (two) times daily. 05/14/20  Yes [name], MD  albuterol (PROVENTIL HFA;VENTOLIN HFA) 108 (90 Base) MCG/ACT inhaler Inhale 1-2 puffs into the lungs every 6 (six) hours as needed for wheezing or shortness of breath. 04/23/18   Long, [name], MD  allopurinol (ZYLOPRIM) 100 MG tablet Take 100 mg by mouth daily.    [provider]  ANORO ELLIPTA 62.5-25 MCG/INH AEPB Inhale 1 puff into the lungs daily.  04/11/18   [provider]  benzonatate (TESSALON) 200 MG capsule Take 1 capsule (200 mg total) by mouth in the morning, at noon, and at bedtime. 7 DS 04/13/20   Regalado, Jerald Kief A, MD  Blood Glucose Monitoring Suppl (ACCU-CHEK AVIVA PLUS) w/Device KIT 1 each by Other route daily.  08/07/17   [provider]  butalbital-acetaminophen-caffeine (FIORICET) 50-325-40 MG tablet Take 1 tablet by mouth 2 (two) times daily as  needed for migraine. 08/13/20   [provider]  calcitRIOL (ROCALTROL) 0.5 MCG capsule Take 1 capsule (0.5 mcg total) by mouth daily. 09/26/16   Hongalgi, [name], MD  carvedilol (COREG) 6.25 MG tablet Take 1 tablet (6.25 mg total) by mouth 2 (two) times daily with a meal. 01/23/18   Bensimhon, [name], MD  cefdinir (OMNICEF) 300 MG capsule TAKE 1 CAPSULE (300 MG TOTAL) BY MOUTH TWO TIMES DAILY FOR 4 DAYS. Patient not taking: No sig reported 04/20/20 04/20/21  [name], MD  clonazePAM (KLONOPIN) 2 MG tablet Take 1 tablet (2 mg total) by mouth 3 (three) times daily. for anxiety Patient taking differently: Take 2 mg by mouth 3 (three) times daily. 06/23/19   [name], MD  cyclobenzaprine (FLEXERIL) 10 MG tablet Take 10 mg by mouth 3 (three) times daily. 11/19/19   [provider]  diclofenac sodium (VOLTAREN) 1 % GEL Apply 2 g topically 4 (four) times daily as needed (for pain). Patient not taking: No sig reported 06/19/18  [name], MD  EPINEPHrine 0.3 mg/0.3 mL IJ SOAJ injection Inject 0.3 mg into the muscle daily as needed for anaphylaxis.    [provider]  FEROSUL 325 (65 Fe) MG tablet Take 325 mg by mouth daily. 03/22/17   [provider]  glucose blood test strip Use as instructed to check blood sugar up to 4 times daily Patient taking differently: 1 each by Other route See admin instructions. Use as instructed to check blood sugar up to 4 times daily 08/06/17   [name], DO  guaiFENesin (MUCINEX) 600 MG 12 hr tablet Take 2 tablets (1,200 mg total) by mouth 2 (two) times daily. 04/13/20   Regalado, Belkys A, MD  ipratropium-albuterol (DUONEB) 0.5-2.5 (3) MG/3ML SOLN Take 3 mLs by nebulization 4 (four) times daily.  04/05/17   [provider]  levothyroxine (SYNTHROID, LEVOTHROID) 112 MCG tablet Take 224 mcg by mouth daily. 07/11/17   [provider]  lisinopril (PRINIVIL,ZESTRIL) 5 MG tablet Take 1 tablet (5 mg total) by  mouth daily. Please call for office visit [phone] Patient taking differently: Take 5 mg by mouth daily. 08/15/17   [name], MD  loperamide (IMODIUM) 2 MG capsule Take 1 capsule (2 mg total) by mouth 4 (four) times daily as needed for diarrhea or loose stools. 09/23/05   [name], MD  metFORMIN (GLUCOPHAGE) 500 MG tablet Take 1 tablet (500 mg total) by mouth 2 (two) times daily with a meal. 12/11/14   [name], MD  nitroGLYCERIN (NITROSTAT) 0.4 MG SL tablet Place 1 tablet (0.4 mg total) under the tongue every 5 (five) minutes as needed for chest pain. 06/19/18   [name], MD  nystatin cream (MYCOSTATIN) Apply 1 application topically 2 (two) times daily as needed for dry skin. 04/13/20   Regalado, Belkys A, MD  omeprazole (PRILOSEC) 20 MG capsule Take 20 mg by mouth 2 (two) times daily before a meal. 11/19/19   [provider]  oxyCODONE-acetaminophen (PERCOCET) 10-325 MG tablet Take 1 tablet by mouth every 6 (six) hours as needed for pain. 12/03/19   [provider]  OXYGEN Inhale 2-3 L into the lungs continuous.    [provider]  polyethylene glycol (MIRALAX / GLYCOLAX) 17 g packet Take 17 g by mouth daily as needed for mild constipation.    [provider]  QC STOOL SOFTENER PLS LAXATIVE 8.6-50 MG tablet Take 1 tablet by mouth at bedtime. 04/11/17   [provider]  rosuvastatin (CRESTOR) 10 MG tablet Take 10 mg by mouth every evening. 11/19/19   [provider]  traZODone (DESYREL) 50 MG tablet Take 100 mg by mouth at bedtime. 11/19/19   [provider]    Family History Family History  Problem Relation Age of Onset  . Cancer Father        thought to be due to exposure to concrete  . Diabetes Mother   . Thyroid cancer Mother        dx in her 62s-60s  . Cancer Maternal Uncle        2 uncles with cancer NOS  . Brain cancer Paternal Aunt   . Cancer Cousin        maternal first cousin - NOS  . Cancer  Cousin        maternal first cousin - NOS    Social History Social History   Tobacco Use  . Smoking status: Former Smoker    Packs/day: 0.50  Years: 41.00    Pack years: 20.50    Types: Cigarettes    Quit date: 07/2016    Years since quitting: 4.1  . Smokeless tobacco: Never Used  . Tobacco comment: Starting to Wean Off -- using Nicotine Patch  Vaping Use  . Vaping Use: Never used  Substance Use Topics  . Alcohol use: Not Currently    Alcohol/week: 0.0 standard drinks    Comment: ? none now  . Drug use: Never    Comment: none for long time     Allergies   Bee venom, Fioricet [butalbital-apap-caffeine], Ibuprofen, Lamisil [terbinafine], and Nsaids   Review of Systems Review of Systems  Constitutional: Negative.   Respiratory: Negative.   Cardiovascular: Negative.   Musculoskeletal: Positive for arthralgias. Negative for back pain, gait problem, joint swelling, myalgias, neck pain and neck stiffness.  Skin: Negative.   Neurological: Negative.      Physical Exam Triage Vital Signs ED Triage Vitals  Enc Vitals Group     BP 08/16/20 1819 (!) 112/56     Pulse Rate 08/16/20 1819 89     Resp 08/16/20 1819 18     Temp 08/16/20 1819 99.2 F (37.3 C)     Temp src --      SpO2 08/16/20 1819 92 %     Weight --      Height --      Head Circumference --      Peak Flow --      Pain Score 08/16/20 1805 10     Pain Loc --      Pain Edu? --      Excl. in [place]? --    No data found.  Updated Vital Signs BP (!) 112/56   Pulse 89   Temp 99.2 F (37.3 C)   Resp 18   SpO2 92%   Visual Acuity Right Eye Distance:   Left Eye Distance:   Bilateral Distance:    Right Eye Near:   Left Eye Near:    Bilateral Near:     Physical Exam Constitutional:      Appearance: She is obese.  Eyes:     Extraocular Movements: Extraocular movements intact.  Pulmonary:     Effort: Pulmonary effort is normal.  Musculoskeletal:     Cervical back: Normal range of motion.        Legs:     Comments: Tenderness over entirety of anterior knee, mild swelling noted on lateral side of anterior knee, ROM intact,,able to bear weight patient able to and from examination room with cane   Skin:    General: Skin is warm and dry.  Neurological:     General: No focal deficit present.     Mental Status: She is alert and oriented to person, place, and time. Mental status is at baseline.  Psychiatric:        Mood and Affect: Mood normal.        Behavior: Behavior normal.        Thought Content: Thought content normal.        Judgment: Judgment normal.      UC Treatments / Results  Labs (all labs ordered are listed, but only abnormal results are displayed) Labs Reviewed - No data to display  EKG   Radiology No results found.  Procedures Procedures (including critical care time)  Medications Ordered in UC Medications - No data to display  Initial Impression / Assessment and Plan / UC Course  I have  reviewed the triage vital signs and the nursing notes.  Pertinent labs & imaging results that were available during my care of the patient were reviewed by me and considered in my medical decision making (see chart for details).  Chronic pain of left knee  1.  Discussed the use of Lasix and how that would not be beneficial in the treatment of arthritis 2.  Requesting steroid injection discussed with patient that that is out of scope of practice and that follow-up with orthopedic physician for evaluation would be necessary, given [facility] referral, patient on phone with practice before I left the room.  3.  This is a chronic condition for this patient, already prescribed several medications for management, discussed with patient that primary provider needs to manage this condition and advised continuation of prescribed narcotic as needed Final Clinical Impressions(s) / UC Diagnoses   Final diagnoses:  Chronic pain of left knee     Discharge Instructions      Encourage patient to follow-up with orthopedic practice for evaluation of arthritis in the knee,  Can continue use of prescribed diclofenac gel as needed or use of Percocets as prescribed   ED Prescriptions    None     PDMP not reviewed this encounter.   Hans Eden, NP 08/16/20 1916

## 2020-08-16 NOTE — Discharge Instructions (Addendum)
Encourage patient to follow-up with orthopedic practice for evaluation of arthritis in the knee,  Can continue use of prescribed diclofenac gel as needed or use of Percocets as prescribed

## 2020-08-17 NOTE — Progress Notes (Addendum)
Subjective:  Patient ID: Michele Owens, female    DOB: [birth year],  MRN: [id number]  Chief Complaint  Patient presents with  . Injections  . Nail Problem    Thick/painful toenails    60 y.o. female presents with the above complaint. History confirmed with patient.  She is here today for same-day appointment and is very distressed and says that she has pain and swelling in her left knee and is requesting an injection of this.  She also wants injections in her heels.  Objective:  Physical Exam: warm, good capillary refill, no trophic changes or ulcerative lesions and normal DP and PT pulses.  She has neuropathy with paresthesias and loss of protective sensation.  She complains of left knee pain.  She says that she has pain in her heels but when I distract her and talk about her knee with compression of the plantar fascia as well as the plantar heel she does not respond to this, does not actually had seem to have pain with palpation here.  She does have thickened elongated toenails x8 of the lesser toes with yellow discoloration subungual debris Assessment:   1. Pain due to onychomycosis of toenails of both feet   2. Chronic pain of left knee   3. Plantar fasciitis, left   4. Plantar fasciitis, right   5. Diabetic peripheral neuropathy associated with type 2 diabetes mellitus ([place])   6. Diabetes mellitus due to underlying condition with diabetic autonomic neuropathy, unspecified whether long term insulin use ([place])      Plan:  Patient was evaluated and treated and all questions answered.  At her request I performed sharp and mechanical debridement performed of all painful and mycotic nails today. Nails debrided in length and thickness using a nail nipper to level of comfort. Discussed treatment options including appropriate shoe gear. Follow up as needed for painful nails.  I do not think she actually has plantar fasciitis at this point.  She has had multiple repeated injections and last  injection I gave her in December 2021 she screamed loudly cried disturb the other patients and then complained that she thinks that I left with a needle in there which clearly was not the case.  With distraction she does not have lateral heel pain with palpation or compression.  Also discussed with her that repeated injections over and over will be detrimental to both her diabetes as well as her overall foot health.  I would not inject her at any point in the future.  I think that the majority this is diabetic peripheral neuropathy causing her pain.  Additionally she was requesting left knee injections.  I discussed with her that this is out of scope muscular practice and I would not do this.  This made her very upset and angry and she has to speak the proximal administrator which she did.  She claims that she had an injection here with "Dr. Nadara Mustard here in this office last year".  I discussed with her that there is no Dr. Nadara Mustard here and this would was likely the orthopedic office that she is referring to.  I did place a referral for Ortho care and she can follow-up with them for this.  If she returns for any other issues I would like her to follow-up with another provider as she has unrealistic goals of treatment, expectations and I do not think she has plantar fasciitis and is likely peripheral neuropathy which she should see her primary care doctor for.  There  is not much I can offer at this point   Return if symptoms worsen or fail to improve.

## 2020-08-18 DIAGNOSIS — E1142 Type 2 diabetes mellitus with diabetic polyneuropathy: Secondary | ICD-10-CM | POA: Insufficient documentation

## 2020-08-18 DIAGNOSIS — M1712 Unilateral primary osteoarthritis, left knee: Secondary | ICD-10-CM | POA: Diagnosis not present

## 2020-08-18 DIAGNOSIS — E1140 Type 2 diabetes mellitus with diabetic neuropathy, unspecified: Secondary | ICD-10-CM | POA: Diagnosis not present

## 2020-08-18 DIAGNOSIS — M79671 Pain in right foot: Secondary | ICD-10-CM | POA: Diagnosis not present

## 2020-08-21 ENCOUNTER — Other Ambulatory Visit: Payer: Self-pay

## 2020-08-21 ENCOUNTER — Emergency Department (HOSPITAL_COMMUNITY)
Admission: EM | Admit: 2020-08-21 | Discharge: 2020-08-21 | Disposition: A | Discharge status: Home / Self Care | Payer: Medicaid Other | Attending: Emergency Medicine | Admitting: Emergency Medicine

## 2020-08-21 ENCOUNTER — Emergency Department (HOSPITAL_COMMUNITY): Payer: Medicaid Other

## 2020-08-21 DIAGNOSIS — I110 Hypertensive heart disease with heart failure: Secondary | ICD-10-CM | POA: Diagnosis not present

## 2020-08-21 DIAGNOSIS — R609 Edema, unspecified: Secondary | ICD-10-CM | POA: Diagnosis not present

## 2020-08-21 DIAGNOSIS — Z87891 Personal history of nicotine dependence: Secondary | ICD-10-CM | POA: Insufficient documentation

## 2020-08-21 DIAGNOSIS — E1169 Type 2 diabetes mellitus with other specified complication: Secondary | ICD-10-CM | POA: Diagnosis not present

## 2020-08-21 DIAGNOSIS — Z79899 Other long term (current) drug therapy: Secondary | ICD-10-CM | POA: Insufficient documentation

## 2020-08-21 DIAGNOSIS — E785 Hyperlipidemia, unspecified: Secondary | ICD-10-CM | POA: Diagnosis not present

## 2020-08-21 DIAGNOSIS — I5032 Chronic diastolic (congestive) heart failure: Secondary | ICD-10-CM | POA: Insufficient documentation

## 2020-08-21 DIAGNOSIS — I10 Essential (primary) hypertension: Secondary | ICD-10-CM | POA: Diagnosis not present

## 2020-08-21 DIAGNOSIS — Z85850 Personal history of malignant neoplasm of thyroid: Secondary | ICD-10-CM | POA: Diagnosis not present

## 2020-08-21 DIAGNOSIS — M549 Dorsalgia, unspecified: Secondary | ICD-10-CM | POA: Diagnosis not present

## 2020-08-21 DIAGNOSIS — W1839XA Other fall on same level, initial encounter: Secondary | ICD-10-CM | POA: Insufficient documentation

## 2020-08-21 DIAGNOSIS — R6889 Other general symptoms and signs: Secondary | ICD-10-CM | POA: Diagnosis not present

## 2020-08-21 DIAGNOSIS — R0781 Pleurodynia: Secondary | ICD-10-CM

## 2020-08-21 DIAGNOSIS — J449 Chronic obstructive pulmonary disease, unspecified: Secondary | ICD-10-CM | POA: Insufficient documentation

## 2020-08-21 DIAGNOSIS — Z7984 Long term (current) use of oral hypoglycemic drugs: Secondary | ICD-10-CM | POA: Diagnosis not present

## 2020-08-21 DIAGNOSIS — J45909 Unspecified asthma, uncomplicated: Secondary | ICD-10-CM | POA: Diagnosis not present

## 2020-08-21 DIAGNOSIS — W19XXXA Unspecified fall, initial encounter: Secondary | ICD-10-CM

## 2020-08-21 DIAGNOSIS — Z743 Need for continuous supervision: Secondary | ICD-10-CM | POA: Diagnosis not present

## 2020-08-21 LAB — CBG MONITORING, ED: Glucose-Capillary: 109 mg/dL — ABNORMAL HIGH (ref 70–99)

## 2020-08-21 MED ORDER — OXYCODONE-ACETAMINOPHEN 5-325 MG PO TABS
1.0000 | ORAL_TABLET | Freq: Once | ORAL | Status: AC
  Administered 2020-08-21: 1 via ORAL
  Filled 2020-08-21: qty 1

## 2020-08-21 NOTE — ED Triage Notes (Signed)
Pt reports back pain, L sided rib and shoulder pain. Hx of chronic pain. Denies injury. Denies shob greater than her normal. Wears 3L O2 at baseline.

## 2020-08-21 NOTE — ED Provider Notes (Signed)
[facility] EMERGENCY DEPARTMENT Provider Note   CSN: [id number] Arrival date & time: 08/21/20  1720     History No chief complaint on file.   Michele Owens is a 60 y.o. female.  HPI Presents with pain in her left side and the left side of her back.  She states that she fell 5 days ago and might of hit the left side of her body and feels this is when her pain started.  She says it feels like an ache in her ribs.  She is still able to lift her arms above her head and use her arms but her shoulder does hurt.  No head or neck injury, no loss of consciousness, no nausea or vomiting.  Wearing her 3 L of oxygen at baseline and while she has difficulty breathing she does largely feel that it is at her baseline.  She also complains that her doctor changed her shots for her diabetes and she is not sure how to use them and would like them changed.     Past Medical History:  Diagnosis Date  . Anxiety   . Arthritis   . Asthma   . Chronic diastolic CHF (congestive heart failure) ([name])   . COPD (chronic obstructive pulmonary disease) (HCC)    Uses Oxygen at night  . Depression   . Diabetes mellitus without complication ([place])   . Dyspnea    occ  . Family history of thyroid cancer   . GERD (gastroesophageal reflux disease)   . Gout   . Headache    migraines  . History of DVT of lower extremity   . History of nuclear stress test    Myoview 2/17:  Low risk stress nuclear study with a small, moderate intensity, partially reversible inferior lateral defect consistent with small prior infarct and minimal peri-infarct ischemia; EF 68 with normal wall motion  . Hypertension   . Pneumonia   . Thyroid cancer ([place]) 09/23/2016    Patient Active Problem List   Diagnosis Date Noted  . Acute on chronic respiratory failure with hypoxia and hypercapnia ([place]) 04/18/2020  . Acute on chronic respiratory failure with hypoxia ([place]) 04/18/2020  . Hemoptysis 04/18/2020  . Acute metabolic  encephalopathy 87/56/4332  . Acute encephalopathy 04/18/2020  . Chronic diastolic CHF (congestive heart failure) ([place]) 04/18/2020  . Sepsis ([place]) 04/18/2020  . Morbid obesity with BMI of 50.0-59.9, adult ([place])   . Fall at home, initial encounter 06/23/2018  . HNP (herniated nucleus pulposus), thoracic 06/23/2018  . CHF (congestive heart failure) ([place]) 06/18/2018  . Hypertensive disorder 08/28/2017  . Obesity 08/28/2017  . COPD exacerbation ([place]) 08/04/2017  . Acute and chronic respiratory failure with hypercapnia ([place])   . Pulmonary HTN ([place])   . Genetic testing 02/27/2017  . Family history of thyroid cancer   . Hypomagnesemia 10/03/2016  . Atypical chest pain   . HLD (hyperlipidemia) 09/23/2016  . Gout 09/23/2016  . DVT (deep vein thrombosis) in pregnancy 09/23/2016  . Thyroid cancer ([place]) 09/23/2016  . Hypocalcemia 09/23/2016  . AKI (acute kidney injury) ([place]) 09/23/2016  . Acute pulmonary edema (HCC)   . Acute hypoxemic respiratory failure ([place])   . Ventilator dependent ([place])   . Chronic respiratory failure with hypoxia and hypercapnia ([place]) 07/16/2016  . CAP (community acquired pneumonia) 01/23/2016  . 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 Pulmonary hypertension ([place]) 01/23/2016  . Obesity hypoventilation syndrome ([place]) 08/26/2015  . Dyslipidemia associated with type 2 diabetes  mellitus ([place]) 04/24/2015  . Leukocytosis 04/24/2015  . Controlled type 2 diabetes mellitus without complication, without long-term current use of insulin ([place]) 04/24/2015  . Anxiety 04/24/2015  . Benign essential HTN 04/24/2015  . Normocytic anemia 04/24/2015  . OSA (obstructive sleep apnea) 05/10/2012  . Tobacco abuse 07/04/2011    Past Surgical History:  Procedure Laterality Date  . BUNIONECTOMY Bilateral   . CARDIAC CATHETERIZATION N/A 06/23/2015   Procedure: Right/Left Heart Cath and Coronary Angiography;  Surgeon: [name], MD;  Location: [facility];  Service: Cardiovascular;  Laterality: N/A;  . COLONOSCOPY WITH PROPOFOL N/A 12/15/2015   Procedure: COLONOSCOPY WITH PROPOFOL;  Surgeon: [name], MD;  Location: [facility];  Service: Endoscopy;  Laterality: N/A;  . RIGHT HEART CATH N/A 10/01/2019   Procedure: RIGHT HEART CATH;  Surgeon: [name], MD;  Location: [facility];  Service: Cardiovascular;  Laterality: N/A;  . THYROIDECTOMY  09/19/2016  . THYROIDECTOMY N/A 09/19/2016   Procedure: TOTAL THYROIDECTOMY;  Surgeon: [name], MD;  Location: [facility];  Service: General;  Laterality: N/A;  . TONSILLECTOMY    . TOTAL ABDOMINAL HYSTERECTOMY  07/14/10     OB History   No obstetric history on file.     Family History  Problem Relation Age of Onset  . Cancer Father        thought to be due to exposure to concrete  . Diabetes Mother   . Thyroid cancer Mother        dx in her 18s-60s  . Cancer Maternal Uncle        2 uncles with cancer NOS  . Brain cancer Paternal Aunt   . Cancer Cousin        maternal first cousin - NOS  . Cancer Cousin        maternal first cousin - NOS    Social History   Tobacco Use  . Smoking status: Former Smoker    Packs/day: 0.50    Years: 41.00    Pack years: 20.50    Types: Cigarettes    Quit date: 07/2016    Years since quitting: 4.1  . Smokeless tobacco: Never Used  . Tobacco comment: Starting to Wean Off -- using Nicotine Patch  Vaping Use  . Vaping Use: Never used  Substance Use Topics  . Alcohol use: Not Currently    Alcohol/week: 0.0 standard drinks    Comment: ? none now  . Drug use: Never    Comment: none for long time    Home Medications Prior to Admission medications   Medication Sig Start Date End Date Taking? Authorizing Provider  albuterol (PROVENTIL HFA;VENTOLIN HFA) 108 (90 Base) MCG/ACT inhaler Inhale 1-2 puffs into the lungs every 6 (six) hours as needed for wheezing or shortness of breath. 04/23/18   Long, [name], MD  allopurinol (ZYLOPRIM) 100 MG  tablet Take 100 mg by mouth daily.    [provider]  ANORO ELLIPTA 62.5-25 MCG/INH AEPB Inhale 1 puff into the lungs daily.  04/11/18   [provider]  benzonatate (TESSALON) 200 MG capsule Take 1 capsule (200 mg total) by mouth in the morning, at noon, and at bedtime. 7 DS 04/13/20   Regalado, Jerald Kief A, MD  Blood Glucose Monitoring Suppl (ACCU-CHEK AVIVA PLUS) w/Device KIT 1 each by Other route daily.  08/07/17   [provider]  butalbital-acetaminophen-caffeine (FIORICET) 50-325-40 MG tablet Take 1 tablet by mouth 2 (two) times daily as needed  for migraine. 08/13/20   [provider]  calcitRIOL (ROCALTROL) 0.5 MCG capsule Take 1 capsule (0.5 mcg total) by mouth daily. 09/26/16   Hongalgi, [name], MD  carvedilol (COREG) 6.25 MG tablet Take 1 tablet (6.25 mg total) by mouth 2 (two) times daily with a meal. 01/23/18   Bensimhon, [name], MD  cefdinir (OMNICEF) 300 MG capsule TAKE 1 CAPSULE (300 MG TOTAL) BY MOUTH TWO TIMES DAILY FOR 4 DAYS. Patient not taking: No sig reported 04/20/20 04/20/21  [name], MD  clonazePAM (KLONOPIN) 2 MG tablet Take 1 tablet (2 mg total) by mouth 3 (three) times daily. for anxiety Patient taking differently: Take 2 mg by mouth 3 (three) times daily. 06/23/19   [name], MD  cyclobenzaprine (FLEXERIL) 10 MG tablet Take 10 mg by mouth 3 (three) times daily. 11/19/19   [provider]  diclofenac sodium (VOLTAREN) 1 % GEL Apply 2 g topically 4 (four) times daily as needed (for pain). Patient not taking: No sig reported 06/19/18   [name], MD  EPINEPHrine 0.3 mg/0.3 mL IJ SOAJ injection Inject 0.3 mg into the muscle daily as needed for anaphylaxis.    [provider]  FEROSUL 325 (65 Fe) MG tablet Take 325 mg by mouth daily. 03/22/17   [provider]  glucose blood test strip Use as instructed to check blood sugar up to 4 times daily Patient taking differently: 1 each by Other route See admin  instructions. Use as instructed to check blood sugar up to 4 times daily 08/06/17   [name], DO  guaiFENesin (MUCINEX) 600 MG 12 hr tablet Take 2 tablets (1,200 mg total) by mouth 2 (two) times daily. 04/13/20   Regalado, Belkys A, MD  ipratropium-albuterol (DUONEB) 0.5-2.5 (3) MG/3ML SOLN Take 3 mLs by nebulization 4 (four) times daily.  04/05/17   [provider]  levothyroxine (SYNTHROID, LEVOTHROID) 112 MCG tablet Take 224 mcg by mouth daily. 07/11/17   [provider]  lisinopril (PRINIVIL,ZESTRIL) 5 MG tablet Take 1 tablet (5 mg total) by mouth daily. Please call for office visit [phone] Patient taking differently: Take 5 mg by mouth daily. 08/15/17   [name], MD  loperamide (IMODIUM) 2 MG capsule Take 1 capsule (2 mg total) by mouth 4 (four) times daily as needed for diarrhea or loose stools. 10/07/50   [name], MD  metFORMIN (GLUCOPHAGE) 500 MG tablet Take 1 tablet (500 mg total) by mouth 2 (two) times daily with a meal. 12/11/14   [name], MD  nitroGLYCERIN (NITROSTAT) 0.4 MG SL tablet Place 1 tablet (0.4 mg total) under the tongue every 5 (five) minutes as needed for chest pain. 06/19/18   [name], MD  nystatin cream (MYCOSTATIN) Apply 1 application topically 2 (two) times daily as needed for dry skin. 04/13/20   Regalado, Belkys A, MD  omeprazole (PRILOSEC) 20 MG capsule Take 20 mg by mouth 2 (two) times daily before a meal. 11/19/19   [provider]  oxyCODONE-acetaminophen (PERCOCET) 10-325 MG tablet Take 1 tablet by mouth every 6 (six) hours as needed for pain. 12/03/19   [provider]  OXYGEN Inhale 2-3 L into the lungs continuous.    [provider]  polyethylene glycol (MIRALAX / GLYCOLAX) 17 g packet Take 17 g by mouth daily as needed for mild constipation.    [provider]  potassium chloride SA (K-DUR,KLOR-CON) 20 MEQ tablet Take 1 tablet (20 mEq total) by mouth 2 (  two) times daily. 07/24/18    [name], MD  QC STOOL SOFTENER PLS LAXATIVE 8.6-50 MG tablet Take 1 tablet by mouth at bedtime. 04/11/17   [provider]  rosuvastatin (CRESTOR) 10 MG tablet Take 10 mg by mouth every evening. 11/19/19   [provider]  torsemide (DEMADEX) 20 MG tablet TAKE 4 (FOUR) TABLETS BY MOUTH DAILY (2AM+ 2NOON) Patient taking differently: Take 40 mg by mouth 2 (two) times daily. 05/14/20   [name], MD  traZODone (DESYREL) 50 MG tablet Take 100 mg by mouth at bedtime. 11/19/19   [provider]    Allergies    Bee venom, Fioricet [butalbital-apap-caffeine], Ibuprofen, Lamisil [terbinafine], and Nsaids  Review of Systems   Review of Systems  Constitutional: Negative for chills and fever.  HENT: Negative for ear pain and sore throat.   Eyes: Negative for pain and visual disturbance.  Respiratory: Negative for cough and shortness of breath.   Cardiovascular: Positive for leg swelling (chronic). Negative for palpitations.  Gastrointestinal: Negative for nausea and vomiting.  Genitourinary: Negative for dysuria and hematuria.  Musculoskeletal: Negative for arthralgias and back pain.  Skin: Negative for color change and rash.  Neurological: Negative for seizures and syncope.  All other systems reviewed and are negative.   Physical Exam Updated Vital Signs BP 124/68 (BP Location: Left Wrist)   Pulse 87   Temp 98.7 F (37.1 C) (Oral)   Resp (!) 22   SpO2 99%   Physical Exam Vitals and nursing note reviewed.  Constitutional:      General: She is not in acute distress.    Appearance: She is well-developed.  HENT:     Head: Normocephalic and atraumatic.  Eyes:     Conjunctiva/sclera: Conjunctivae normal.  Cardiovascular:     Rate and Rhythm: Normal rate and regular rhythm.     Heart sounds: No murmur heard.   Pulmonary:     Effort: Pulmonary effort is normal. No respiratory distress.     Breath sounds: Normal breath sounds.  Abdominal:      Palpations: Abdomen is soft.     Tenderness: There is no abdominal tenderness.  Musculoskeletal:        General: Tenderness (L rib, L side of back, midline T and L spine) present.     Cervical back: Neck supple.     Right lower leg: Edema present.     Left lower leg: Edema present.     Comments: C spine not ttp  Skin:    General: Skin is warm and dry.     Capillary Refill: Capillary refill takes less than 2 seconds.     Comments: Acanthosis present on L flank; possible faint ecchymosis.  Neurological:     General: No focal deficit present.     Mental Status: She is alert.     Motor: No weakness.  Psychiatric:        Thought Content: Thought content normal.        Judgment: Judgment normal.     ED Results / Procedures / Treatments   Labs (all labs ordered are listed, but only abnormal results are displayed) Labs Reviewed  CBG MONITORING, ED - Abnormal; Notable for the following components:      Result Value   Glucose-Capillary 109 (*)    All other components within normal limits    EKG None  Radiology DG Chest 2 View  Result Date: 08/21/2020 CLINICAL DATA:  Left rib pain after fall. EXAM: CHEST -  2 VIEW COMPARISON:  Radiograph 08/13/2020.  Most recent CT 04/18/2020 FINDINGS: Stable cardiomegaly. Unchanged mediastinal contours. Streaky bibasilar and right midlung opacities, atelectasis versus scarring. No pneumothorax or pleural effusion. No acute osseous abnormalities are seen. No visualized rib fracture. IMPRESSION: 1. No acute findings. 2. Stable cardiomegaly. Streaky bibasilar and right midlung opacities, atelectasis versus scarring. Electronically Signed   By: Keith Rake M.D.   On: 08/21/2020 20:54   DG Thoracic Spine 2 View  Result Date: 08/21/2020 CLINICAL DATA:  Back pain after fall.  Rib pain. EXAM: THORACIC SPINE 2 VIEWS COMPARISON:  None. FINDINGS: Normal alignment. No evidence of fracture, evaluation is technically limited due to soft tissue attenuation from  habitus, particularly at the thoracolumbar junction. Multilevel endplate spurring. Vertebral body heights are grossly normal. No paravertebral soft tissue abnormality to suggest fracture. IMPRESSION: 1. No evidence of thoracic spine fracture. 2. Mild degenerative change. 3. Technically limited evaluation. Electronically Signed   By: Keith Rake M.D.   On: 08/21/2020 20:53   DG Lumbar Spine 2-3 Views  Result Date: 08/21/2020 CLINICAL DATA:  Back pain after fall. EXAM: LUMBAR SPINE - 2-3 VIEW COMPARISON:  None. FINDINGS: Normal alignment. Five non-rib-bearing lumbar vertebra. No evidence of fracture. Examination is technically limited due to soft tissue attenuation from habitus, particularly at L5. Multilevel endplate spurring with disc space narrowing at L3-L4. Suspected facet hypertrophy at L3-L4. The sacroiliac joints are congruent. IMPRESSION: 1. No evidence of acute fracture of the lumbar spine. 2. Multilevel degenerative disc disease and facet hypertrophy. 3. Technically limited exam. Electronically Signed   By: Keith Rake M.D.   On: 08/21/2020 20:51    Procedures Procedures   Medications Ordered in ED Medications  oxyCODONE-acetaminophen (PERCOCET/ROXICET) 5-325 MG per tablet 1 tablet (has no administration in time range)    ED Course  I have reviewed the triage vital signs and the nursing notes.  Pertinent labs & imaging results that were available during my care of the patient were reviewed by me and considered in my medical decision making (see chart for details).    MDM Rules/Calculators/A&P                          Patient presents with left-sided pain in context of fall.  No signs of head or neck injury.  Will obtain imaging to further evaluate. POC glc 107; advised outpt f/u PCP for insulin changes.  Chest x-ray reviewed by me and does not show pulmonary residual edema, focal consolidation concerning for infectious pneumonia, or any rib fractures.  T and L-spine  x-rays do not show any fracture or malalignment.  On repeat discussion, she seems and encouraged that she does not have pneumonia on her chest x-ray.  Discussed her glucose and her need for outpatient follow-up for further insulin management.  Home dose pain medication provided.  Plan is for outpatient primary care follow-up.  Return precautions provided.   Final Clinical Impression(s) / ED Diagnoses Final diagnoses:  Rib pain on left side    Rx / DC Orders ED Discharge Orders    None       Aris Lot, MD 08/21/20 2138    [name], MD 08/21/20 2224

## 2020-08-21 NOTE — Discharge Instructions (Signed)
Please come back for worsening difficulty breathing, severe pain, lightheadedness or passing out.

## 2020-08-23 ENCOUNTER — Other Ambulatory Visit: Payer: Self-pay | Admitting: Obstetrics and Gynecology

## 2020-08-23 ENCOUNTER — Other Ambulatory Visit: Payer: Self-pay

## 2020-08-23 NOTE — Patient Outreach (Signed)
Care Coordination  08/23/2020  Michele Owens [birth year] [id number]   RNCM returned patient's call.  Patient stated her aide was there and asked to be called  tomorrow morning at 1100.  RNCM scheduled appointment.  [name] RN, BSN [place]  [employer] - Managed Medicaid High Risk [phone].

## 2020-08-23 NOTE — Patient Outreach (Signed)
Care Coordination  08/23/2020  Michele Owens 08-02-1960 [id number]   RNCM returned patient's phone call.  Patient with questions regarding insulin, testing, and strips.  Also,  questions regarding jury duty.  Referral to Pharmacy and Social Work.  [name] RN, BSN [place]  [employer] - Managed Medicaid High Risk [phone].

## 2020-08-23 NOTE — Patient Outreach (Signed)
Patient called me stating she can't use new monitor. Called Christy Sartorius at Pharmacy and he said someone showed her when it was delivered and 4 days ago again when she came to office. Spoke with patient, asked her if it'd be alright if she came into PCP's to get shown again. Spoke with Solmon Ice at Dr. Leontine Locket office, Solmon Ice assured me that she would go this afternoon and show patient.   Also coordinated with Terri, Nurse, regarding issues this morning. Looking into getting a Education officer, museum to make a home visit

## 2020-08-24 ENCOUNTER — Encounter: Payer: Self-pay | Admitting: Podiatry

## 2020-08-24 ENCOUNTER — Other Ambulatory Visit: Payer: Self-pay | Admitting: Obstetrics and Gynecology

## 2020-08-24 ENCOUNTER — Other Ambulatory Visit: Payer: Self-pay

## 2020-08-24 ENCOUNTER — Ambulatory Visit: Payer: Medicaid Other | Admitting: Podiatry

## 2020-08-24 NOTE — Patient Outreach (Signed)
Care Coordination  08/24/2020  Michele Owens 04/10/1961 [id number]   RNCM returned patient's phone call and answered questions.  Patient states she is now able to check blood sugars.  Has appointment at [facility] 08/25/20 at 0930.  [name] RN, BSN [place]  [employer] - Managed Medicaid High Risk [phone].

## 2020-08-24 NOTE — Patient Outreach (Signed)
Care Coordination  08/24/2020  Giordana Weinheimer 27-Oct-1960 [id number]   RNCM called patient at agreed upon time-no answer-left message.  [name] RN, BSN [place]  [employer] - Managed Medicaid High Risk [phone].

## 2020-08-25 DIAGNOSIS — M79672 Pain in left foot: Secondary | ICD-10-CM | POA: Diagnosis not present

## 2020-08-26 ENCOUNTER — Other Ambulatory Visit: Payer: Self-pay

## 2020-08-26 NOTE — Patient Instructions (Signed)
Visit Information  Michele Owens was given information about Medicaid Managed Care team care coordination services as a part of their [employer] benefit. Michele Owens verbally consented to engagement with the Medicaid Managed Care team.   For questions related to your [employer] health plan, please call: [phone]  If you would like to schedule transportation through your [employer] Medicaid plan, please call the following number at least 2 days in advance of your appointment: [phone]   Call the Behavioral Health Crisis Line at [phone], at any time, 24 hours a day, 7 days a week. If you are in danger or need immediate medical attention call 911.  Michele Owens - following are the goals we discussed in your visit today:  Goals Addressed   None     Please see education materials related to DM provided as print materials.   Patient verbalizes understanding of instructions provided today.   The Managed Medicaid care management team will reach out to the patient again over the next 30 days.   Michele Owens, Pharm.D., Managed Medicaid Pharmacist - [phone]   Following is a copy of your plan of care:  Patient Care Plan: General Plan of Care (Adult)    Problem Identified: Health Promotion or Disease Self-Management (General Plan of Care)   Priority: Medium  Onset Date: 04/23/2020    Problem Identified: Barriers to Treatment     Long-Range Goal: Barriers to Treatment Identified and Managed   Start Date: 05/03/2020  Expected End Date: 08/01/2020  Recent Progress: Not on track  Priority: High  Note:   CARE PLAN ENTRY Medicaid Managed Care (see longitudinal plan of care for additional care plan information)  Current Barriers:  . Transportation barriers-patient utilizing [employer] transportation as needed. . Limited access to caregiver-patient needs PCS, currently not receiving services she is expecting.  Patient states she is now receiving PCS.  Nurse  Clinical Goal(s):  . Over the next 30 days, patient will work with [employer] to address needs related to transportation. . Update 05/12/20:  Patient has used [employer] and is very pleased. . Over the next 90 days, patient will attend all scheduled medical appointments. . RNCM will make referral to SW for assistance with PCS . Update 05/20/20:  Patient states she is now receiving PCS from [facility] 3 days a week for 4 hours each day. . Update 05/27/20: BSW spoke with patient about PCS, she stated someone usually comes in 3 days a week for a few hours. She stated her aide was off today. BSW contacted [facility] and spoke with Michele Owens, she stated that patient has 13 hours per week with 6 days a week, however patient chooses to have an aide come out 3 days a week so that she can have more hours. The aide assist with most ADL's. . Update 06/04/20: Patient stated she is ready to move to [place]. She has already started the process but wants to save some money first and she has to wait on her nephew who will drive the uhaul truck. . Update 06/24/20: BSW contacted patient to follow up on her moving to [place], she stated she is still saving money. Patient did cut call short because she was sleeping, patient stated she was not in need of any resources at this time.  Interventions: . Inter-disciplinary care team collaboration (see longitudinal plan of care) . Referred patient to community resources care guide team for assistance with transportation. . Provided patient with information   about [employer]. . Discussed plans with patient for ongoing care management follow up and provided patient with direct contact information for care management team . Advised patient to call [employer]. . Collaborated with [employer] regarding transportation. . Assisted patient/caregiver with obtaining information about health plan  benefits . Collaborated with Social Work regarding PCS. . Update 06/14/20:  Patient continues to utilize [employer], aide, [facility] as needed-has missed appointments due to transportation "no shows"-will continue to follow.  Patient Self Care Activities:  . Patient will call [employer]. . Update 06/07/20:  Patient continues to utilize [employer] services with success. . Update 06/14/20:  Patient has missed appointments due to transportation no shows.  [facility] has been utilized as needed.      Task: Alleviate Barriers to Mental Health Treatment   Note:   Care Management Activities:    - willingness to receive help encouraged    Notes:    Patient Care Plan: General Plan of Care (Adult)    Problem Identified: Health Promotion or Disease Self-Management (General Plan of Care)   Priority: High  Onset Date: 04/23/2020    Long-Range Goal: Self-Management Plan Developed   Start Date: 04/23/2020  Expected End Date: 07/22/2020  Recent Progress: Not on track  Priority: Medium  Note:   Current Barriers:  . Chronic Disease Management support and education needs.  Patient with abdominal wall infection-seen at [facility] 05/24/20 - I&D performed. . Update 06/01/20:  Patient states she is improving-HHRN is cleaning and dressing infection. . Update 06/07/20:  Infection continues to improve per patient, HHRN continues to clean and dress infection site. . Update 06/17/20:  Infection improved, healed.  Nurse Case Manager Clinical Goal(s):  . Over the next 30 days, patient will attend all scheduled medical appointments:  . Over the next 30 days, patient will work with CM team pharmacist to review medications. . Update 05/20/20:  Patient has appointment with Pharmacist 05/21/20. . Update 05/25/20:  Patient declines to speak with Pharmacist at this time.  Interventions:  . Inter-disciplinary care team collaboration (see longitudinal plan of  care) . Evaluation of current treatment plan and patient's adherence to plan as established by provider. . Reviewed medications with patient. . Collaborated with pharmacist regarding medications. . Discussed plans with patient for ongoing care management follow up and provided patient with direct contact information for care management team . Reviewed scheduled/upcoming provider appointments.  . Update 05/20/20:  Patient unable to attend PT appointment 05/19/20, has next appointment 05/26/20.  Has Cardiology appointment 05/28/20.  Reviewed [employer] information with patient again and with aide. . Update 06/07/20:  Patient has PT appointment 06/09/20. . Update 06/17/20:  patient attended PT appointment 06/16/20 and also attended appointment at [facility] this week-patient states she feels better. . Pharmacy referral for medication review. . Update 05/24/20: Pharmacist has attempted to speak with patient and has been unable to.  Patient at Urgent Care today with stomach pain.  RNCM will reschedule appointment with patient and speak with her regarding importance of speaking with Pharmacist. . Update 05/25/20: Patient declines to speak with Pharmacist at this time.  Patient Goals/Self-Care Activities Over the next 30 days, patient will:  -Attends all scheduled provider appointments Calls pharmacy for medication refills Calls provider office for new concerns or questions  Follow Up Plan: The Managed Medicaid care management team will reach out to the patient again over the next 30 days.  The patient has been provided with contact information for the   Managed Medicaid care management team and has been advised to call with any health related questions or concerns.       Task: Mutually Develop and Foster Achievement of Patient Goals   Note:   Care Management Activities:    - verbalization of feelings encouraged    Notes:    Patient Care Plan: Medication Management    Problem  Identified: Health Promotion or Disease Self-Management (General Plan of Care)     Goal: Medication Management   Note:   Current Barriers:  . Does not adhere to prescribed medication regimen . Does not maintain contact with provider office . Does not contact provider office for questions/concerns .   Pharmacist Clinical Goal(s):  . Over the next 30 days, patient will achieve adherence to monitoring guidelines and medication adherence to achieve therapeutic efficacy . contact provider office for questions/concerns as evidenced notation of same in electronic health record through collaboration with PharmD and provider.  .   Interventions: . Inter-disciplinary care team collaboration (see longitudinal plan of care) . Comprehensive medication review performed; medication list updated in electronic medical record  @RXCPDIABETES@ @RXCPHYPERTENSION@ @RXCPHYPERLIPIDEMIA@  Patient Goals/Self-Care Activities . Over the next 30 days, patient will:  - take medications as prescribed collaborate with provider on medication access solutions  Follow Up Plan: The care management team will reach out to the patient again over the next 30 days.     Task: Mutually Develop and Foster Achievement of Patient Goals   Note:   Care Management Activities:    - verbalization of feelings encouraged    Notes:      

## 2020-08-26 NOTE — Patient Outreach (Signed)
Medicaid Managed Care    Pharmacy Note  08/26/2020 Name: Michele Owens MRN: [id number] DOB: 23-Jan-1961  Michele Owens is a 60 y.o. year old female who is a primary care patient of [name], MD. The [facility] Managed Care Coordination team was consulted for assistance with disease management and care coordination needs.    Engaged with patient Engaged with patient by telephone for follow up visit in response to referral for case management and/or care coordination services.  Michele Owens was given information about Managed Medicaid Care Coordination team services today. Michele Owens agreed to services and verbal consent obtained.   Objective:  Lab Results  Component Value Date   CREATININE 0.79 08/13/2020   CREATININE 0.67 06/22/2020   CREATININE 0.65 05/24/2020    Lab Results  Component Value Date   HGBA1C 6.2 (H) 04/19/2020       Component Value Date/Time   CHOL 226 (H) 08/05/2017 0318   TRIG 59 08/05/2017 0318   HDL 80 08/05/2017 0318   CHOLHDL 2.8 08/05/2017 0318   VLDL 12 08/05/2017 0318   LDLCALC 134 (H) 08/05/2017 0318    Other: (TSH, CBC, Vit D, etc.)  Clinical ASCVD: No  The ASCVD Risk score [name], et al., 2013) failed to calculate for the following reasons:   Cannot find a previous HDL lab   Cannot find a previous total cholesterol lab    Other: (CHADS2VASc if Afib, PHQ9 if depression, MMRC or CAT for COPD, ACT, DEXA)  BP Readings from Last 3 Encounters:  08/21/20 117/68  08/16/20 (!) 112/56  08/14/20 (!) 141/75    Assessment/Interventions: Review of patient past medical history, allergies, medications, health status, including review of consultants reports, laboratory and other test data, was performed as part of comprehensive evaluation and provision of chronic care management services.    HTN Carvedilol 6.25m BID Lisinopril 545mQD Plan: At goal,  patient stable/ symptoms controlled   Edema Torsemide 2076m2AM and  2Noon) KCl Plan: At goal,  patient stable/ symptoms controlled   DM Lab Results  Component Value Date   HGBA1C 6.2 (H) 04/19/2020   HGBA1C 6.5 (H) 04/11/2020   HGBA1C 6.6 (H) 07/09/2018   Lab Results  Component Value Date   LDLCALC 134 (H) 08/05/2017   CREATININE 0.79 08/13/2020    Lab Results  Component Value Date   NA 143 08/13/2020   K 4.0 08/13/2020   CREATININE 0.79 08/13/2020   GFRNONAA >60 08/13/2020   GFRAA >60 09/30/2019   GLUCOSE 92 08/13/2020    Metformin 500m77mBID May 2022: Patient has been having issues with testing, unable to use new monitor. Coordinated with [name]), and OffiGlass blower/designerPCP'BlueLinxice who did a home visit to show patient how to use test strips this week. Called patient today and she is having no issues testing now. This was the main thing we went over today   Lipids Rosuvastatin 10mg35mn: At goal,  patient stable/ symptoms controlled   Pain Left Foot:  Pain Score: 8/10 Oxycodone 10/325 Cyclobenzaprine March 2022: Missed PT appointment and now can't go back (Per patient).  Insomnia Trazodone 100mg 57mient not sleeping due to stress of her son passing Plan: At goal,  patient stable/ symptoms controlled   Gout Allopurinol 100mg -58mes has gout attack daily but then described OA pain and how her bones rub against each other in her knees and hips March 2022: Ask for Uric Acid Labs April 2022: No labs in yet  Thyroid Levothyroxine 171mg 2QD April 2022: Patient hasn't had meds in 3 weeks. Called Pharmacy and they will deliver today May 2022: Patient is compliant and taking meds as directed  SDOH (Social Determinants of Health) assessments and interventions performed:    Care Plan  Allergies  Allergen Reactions  . Bee Venom Swelling and Other (See Comments)    "All over my body" (swelling)  . Fioricet [Butalbital-Apap-Caffeine] Nausea And Vomiting and Rash  . Ibuprofen Rash and Other (See Comments)     Severe rash  . Lamisil [Terbinafine] Rash and Other (See Comments)    Pt states this causes her to "feel funny"  . Nsaids Other (See Comments)    Per MD's orders     Medications Reviewed Today    Reviewed by CGayla Medicus RN (Registered Nurse) on 08/24/20 at 1615  Med List Status: <None>  Medication Order Taking? Sig Documenting Provider Last Dose Status Informant  albuterol (PROVENTIL HFA;VENTOLIN HFA) 108 (90 Base) MCG/ACT inhaler [id number] Inhale 1-2 puffs into the lungs every 6 (six) hours as needed for wheezing or shortness of breath. LMargette Fast MD 08/13/2020 Unknown time Active Self  allopurinol (ZYLOPRIM) 100 MG tablet [id number] Take 100 mg by mouth daily. [provider] 08/13/2020 Unknown time Active Self           Med Note ([name] [name]   Tue Oct 23, 2017  2:58 AM)    AJearl KlinefelterELLIPTA 62.5-25 MCG/INH AEPB [id number] Inhale 1 puff into the lungs daily.  [provider] 08/13/2020 Unknown time Active Self  benzonatate (TESSALON) 200 MG capsule [id number] Take 1 capsule (200 mg total) by mouth in the morning, at Owens, and at bedtime. 7 DS Regalado, Belkys A, MD 08/13/2020 Unknown time Active Self  Blood Glucose Monitoring Suppl (ACCU-CHEK AVIVA PLUS) w/Device KIT [id number] 1 each by Other route daily.  [provider] unk unk Active Self  butalbital-acetaminophen-caffeine (FIORICET) 50-325-40 MG tablet [id number] Take 1 tablet by mouth 2 (two) times daily as needed for migraine. [provider] Past Month Unknown time Active Self  calcitRIOL (ROCALTROL) 0.5 MCG capsule [id number] Take 1 capsule (0.5 mcg total) by mouth daily. HModena Jansky MD 08/13/2020 Unknown time Active Self           Med Note ([name] [name]   Tue Oct 23, 2017  2:59 AM)    carvedilol (COREG) 6.25 MG tablet [id number] Take 1 tablet (6.25 mg total) by mouth 2 (two) times daily with a meal. Bensimhon, DShaune Pascal MD 08/13/2020 3 am Active Self  cefdinir  (OMNICEF) 300 MG capsule [id number] TAKE 1 CAPSULE (300 MG TOTAL) BY MOUTH TWO TIMES DAILY FOR 4 DAYS.  Patient not taking: No sig reported   PDarliss Cheney MD Not Taking Unknown time Active Self  clonazePAM (KLONOPIN) 2 MG tablet [id number] Take 1 tablet (2 mg total) by mouth 3 (three) times daily. for anxiety  Patient taking differently: Take 2 mg by mouth 3 (three) times daily.   HIsla Pence MD 08/13/2020 Unknown time Active Self  cyclobenzaprine (FLEXERIL) 10 MG tablet [id number] Take 10 mg by mouth 3 (three) times daily. [provider] 08/13/2020 Unknown time Active Self  diclofenac sodium (VOLTAREN) 1 % GEL [id number] Apply 2 g topically 4 (four) times daily as needed (for pain).  Patient not taking: No sig reported   SNorval Morton MD Not Taking Unknown time Active Self  EPINEPHrine 0.3  mg/0.3 mL IJ SOAJ injection [id number] No Inject 0.3 mg into the muscle daily as needed for anaphylaxis. [provider] unk last dose Active Self  FEROSUL 325 (65 Fe) MG tablet [id number] No Take 325 mg by mouth daily. [provider] 08/13/2020 Unknown time Active Self           Med Note [name], [name]   Tue Oct 23, 2017  2:57 AM)    glucose blood test strip [id number] No Use as instructed to check blood sugar up to 4 times daily  Patient taking differently: 1 each by Other route See admin instructions. Use as instructed to check blood sugar up to 4 times daily   [name], DO unk unk Active Self  guaiFENesin (MUCINEX) 600 MG 12 hr tablet [id number] No Take 2 tablets (1,200 mg total) by mouth 2 (two) times daily. Regalado, Belkys A, MD 08/13/2020 Unknown time Active Self  ipratropium-albuterol (DUONEB) 0.5-2.5 (3) MG/3ML SOLN [id number] No Take 3 mLs by nebulization 4 (four) times daily.  [provider] 08/12/2020 Unknown time Active Self           Med Note [name], [name]   Tue Oct 23, 2017  2:57 AM)    levothyroxine (SYNTHROID, LEVOTHROID) 112 MCG tablet  [id number] No Take 224 mcg by mouth daily. [provider] 08/13/2020 Unknown time Active Self           Med Note [name], [name]   Tue Oct 23, 2017  2:57 AM)    lisinopril (PRINIVIL,ZESTRIL) 5 MG tablet [id number] No Take 1 tablet (5 mg total) by mouth daily. Please call for office visit [phone]  Patient taking differently: Take 5 mg by mouth daily.   [name], MD 08/13/2020 Unknown time Active Self           Med Note [name], [name]   Tue Oct 23, 2017  2:58 AM)    loperamide (IMODIUM) 2 MG capsule [id number] No Take 1 capsule (2 mg total) by mouth 4 (four) times daily as needed for diarrhea or loose stools. [name], MD Past Month Unknown time Active Self  metFORMIN (GLUCOPHAGE) 500 MG tablet [id number] No Take 1 tablet (500 mg total) by mouth 2 (two) times daily with a meal. [name], MD 08/13/2020 Unknown time Active Self           Med Note [name], [name]   Tue Oct 23, 2017  2:58 AM)    nitroGLYCERIN (NITROSTAT) 0.4 MG SL tablet [id number] No Place 1 tablet (0.4 mg total) under the tongue every 5 (five) minutes as needed for chest pain. [name], MD unk last dose Active Self  nystatin cream (MYCOSTATIN) [id number] No Apply 1 application topically 2 (two) times daily as needed for dry skin. Regalado, Belkys A, MD Past Week Unknown time Active Self  omeprazole (PRILOSEC) 20 MG capsule [id number] No Take 20 mg by mouth 2 (two) times daily before a meal. [provider] 08/13/2020 Unknown time Active Self  oxyCODONE-acetaminophen (PERCOCET) 10-325 MG tablet [id number] No Take 1 tablet by mouth every 6 (six) hours as needed for pain. [provider] 08/13/2020 Unknown time Active Self           Med Note Broadus John, [facility]   Fri Aug 13, 2020  9:48 PM)    OXYGEN [id number] No Inhale 2-3 L into the lungs continuous. [provider] 08/13/2020 Unknown time Active Self  Med Note Corky Mull   Mon Apr 12, 2020 10:08 AM)     polyethylene glycol (MIRALAX / GLYCOLAX) 17 g packet [id number] No Take 17 g by mouth daily as needed for mild constipation. [provider] Past Week Unknown time Active Self  potassium chloride SA (K-DUR,KLOR-CON) 20 MEQ tablet [id number] No Take 1 tablet (20 mEq total) by mouth 2 (two) times daily. [name], MD 08/13/2020 Unknown time Active Self  QC STOOL SOFTENER PLS LAXATIVE 8.6-50 MG tablet [id number] No Take 1 tablet by mouth at bedtime. [provider] 08/13/2020 Unknown time Active Self           Med Note [name], [name]   Tue Oct 23, 2017  2:56 AM)    rosuvastatin (CRESTOR) 10 MG tablet [id number] No Take 10 mg by mouth every evening. [provider] 08/13/2020 Unknown time Active Self  torsemide (DEMADEX) 20 MG tablet [id number] No TAKE 4 (FOUR) TABLETS BY MOUTH DAILY (2AM+ 2NOON)  Patient taking differently: Take 40 mg by mouth 2 (two) times daily.   [name], MD 08/13/2020 Unknown time Active   traZODone (DESYREL) 50 MG tablet [id number] No Take 100 mg by mouth at bedtime. [provider] 08/12/2020 Unknown time Active Self          Patient Active Problem List   Diagnosis Date Noted  . Acute on chronic respiratory failure with hypoxia and hypercapnia ([place]) 04/18/2020  . Acute on chronic respiratory failure with hypoxia ([place]) 04/18/2020  . Hemoptysis 04/18/2020  . Acute metabolic encephalopathy 22/48/2500  . Acute encephalopathy 04/18/2020  . Chronic diastolic CHF (congestive heart failure) ([place]) 04/18/2020  . Sepsis ([place]) 04/18/2020  . Morbid obesity with BMI of 50.0-59.9, adult ([place])   . Fall at home, initial encounter 06/23/2018  . HNP (herniated nucleus pulposus), thoracic 06/23/2018  . CHF (congestive heart failure) ([place]) 06/18/2018  . Hypertensive disorder 08/28/2017  . Obesity 08/28/2017  . COPD exacerbation ([place]) 08/04/2017  . Acute and chronic respiratory failure with hypercapnia ([place])   . Pulmonary HTN ([place])   . Genetic  testing 02/27/2017  . Family history of thyroid cancer   . Hypomagnesemia 10/03/2016  . Atypical chest pain   . HLD (hyperlipidemia) 09/23/2016  . Gout 09/23/2016  . DVT (deep vein thrombosis) in pregnancy 09/23/2016  . Thyroid cancer ([place]) 09/23/2016  . Hypocalcemia 09/23/2016  . AKI (acute kidney injury) ([place]) 09/23/2016  . Acute pulmonary edema ([facility])   . Acute hypoxemic respiratory failure ([place])   . Ventilator dependent ([place])   . Chronic respiratory failure with hypoxia and hypercapnia ([place]) 07/16/2016  . CAP (community acquired pneumonia) 01/23/2016  . 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 Pulmonary hypertension ([place]) 01/23/2016  . Obesity hypoventilation syndrome ([place]) 08/26/2015  . Dyslipidemia associated with type 2 diabetes mellitus ([place]) 04/24/2015  . Leukocytosis 04/24/2015  . Controlled type 2 diabetes mellitus without complication, without long-term current use of insulin ([place]) 04/24/2015  . Anxiety 04/24/2015  . Benign essential HTN 04/24/2015  . Normocytic anemia 04/24/2015  . OSA (obstructive sleep apnea) 05/10/2012  . Tobacco abuse 07/04/2011    Conditions to be addressed/monitored: HTN and DM  Care Plan : Medication Management  Updates made by Lane Hacker, [place] since 06/28/2020 12:00 AM    Problem: Health Promotion or Disease Self-Management (General Plan of Care)     Goal: Medication Management   Note:   Current Barriers:  . Does not adhere to prescribed medication  regimen . Does not maintain contact with provider office . Does not contact provider office for questions/concerns .   Pharmacist Clinical Goal(s):  Marland Kitchen Over the next 30 days, patient will achieve adherence to monitoring guidelines and medication adherence to achieve therapeutic efficacy . contact provider office for questions/concerns as evidenced notation of same in electronic health record through collaboration with PharmD and provider.   .   Interventions: . Inter-disciplinary care team collaboration (see longitudinal plan of care) . Comprehensive medication review performed; medication list updated in electronic medical record  '@RXCPDIABETES' @ '@RXCPHYPERTENSION' @ '@RXCPHYPERLIPIDEMIA' @  Patient Goals/Self-Care Activities . Over the next 30 days, patient will:  - take medications as prescribed collaborate with provider on medication access solutions  Follow Up Plan: The care management team will reach out to the patient again over the next 30 days.     Task: Mutually Develop and Royce Macadamia Achievement of Patient Goals   Note:   Care Management Activities:    - verbalization of feelings encouraged    Notes:      Medication Assistance: None required. Patient affirms current coverage meets needs.   Follow up: Agree   Plan: The care management team will reach out to the patient again over the next 30 days.   [place] Constable, Pharm.D., Managed Medicaid Pharmacist - [phone]

## 2020-08-27 ENCOUNTER — Other Ambulatory Visit: Payer: Self-pay

## 2020-08-27 ENCOUNTER — Other Ambulatory Visit: Payer: Self-pay | Admitting: Obstetrics and Gynecology

## 2020-08-27 NOTE — Patient Outreach (Signed)
Medicaid Managed Care Social Work Note  08/27/2020 Name:  Michele Owens MRN:  [id number] DOB:  1960-11-15  Michele Owens is an 60 y.o. year old female who is a primary patient of Garba, Henderson Newcomer, MD.  The [facility] Managed Care Coordination team was consulted for assistance with:  assistance  Michele Owens was given information about Medicaid Managed CareCoordination services today. Michele Owens agreed to services and verbal consent obtained.  Engaged with patient  for by telephone forfollow up visit in response to referral for case management and/or care coordination services.   Assessments/Interventions:  Review of past medical history, allergies, medications, health status, including review of consultants reports, laboratory and other test data, was performed as part of comprehensive evaluation and provision of chronic care management services.  SDOH: (Social Determinant of Health) assessments and interventions performed:  Bsw received information from [facility] about patient needing some batteries for her remote. BSW contacted patient to see if any resources were needed. Patient stated she contacted [employer] and they informed her it would cost 40 dollars to replace her remote, patient states she only needs batteries. BSW asked if patient had anyone to call that could bring her some batteries, patient stated no. She states her daughter is mad at her right now and working so she does not talk to her, patient states she has not family to call. BSW asked if her aide could help, patient states she has not had an aide in about a week, she had to let her last aide go because she smelled like marijuana and they have not replaced her. BSW contacted patients healthcare agency [facility] at [phone] and left a voicemail. No other resources were needed at this time.   Advanced Directives Status:  Not addressed in this encounter.  Care Plan                 Allergies  Allergen Reactions  . Bee  Venom Swelling and Other (See Comments)    "All over my body" (swelling)  . Fioricet [Butalbital-Apap-Caffeine] Nausea And Vomiting and Rash  . Ibuprofen Rash and Other (See Comments)    Severe rash  . Lamisil [Terbinafine] Rash and Other (See Comments)    Pt states this causes her to "feel funny"  . Nsaids Other (See Comments)    Per MD's orders     Medications Reviewed Today    Reviewed by [name], RN (Registered Nurse) on 08/24/20 at 1615  Med List Status: <None>  Medication Order Taking? Sig Documenting Provider Last Dose Status Informant  albuterol (PROVENTIL HFA;VENTOLIN HFA) 108 (90 Base) MCG/ACT inhaler [id number] No Inhale 1-2 puffs into the lungs every 6 (six) hours as needed for wheezing or shortness of breath. Margette Fast, MD 08/13/2020 Unknown time Active Self  allopurinol (ZYLOPRIM) 100 MG tablet [id number] No Take 100 mg by mouth daily. [provider] 08/13/2020 Unknown time Active Self           Med Note [name], [name]   Tue Oct 23, 2017  2:58 AM)    [name] ELLIPTA 62.5-25 MCG/INH AEPB [id number] No Inhale 1 puff into the lungs daily.  [provider] 08/13/2020 Unknown time Active Self  benzonatate (TESSALON) 200 MG capsule [id number] No Take 1 capsule (200 mg total) by mouth in the morning, at Owens, and at bedtime. 7 DS Regalado, Belkys A, MD 08/13/2020 Unknown time Active Self  Blood Glucose Monitoring Suppl (ACCU-CHEK AVIVA PLUS) w/Device KIT [id number] No 1 each by Other route  daily.  [provider] unk unk Active Self  butalbital-acetaminophen-caffeine (FIORICET) 50-325-40 MG tablet [id number] No Take 1 tablet by mouth 2 (two) times daily as needed for migraine. [provider] Past Month Unknown time Active Self  calcitRIOL (ROCALTROL) 0.5 MCG capsule [id number] No Take 1 capsule (0.5 mcg total) by mouth daily. Modena Jansky, MD 08/13/2020 Unknown time Active Self           Med Note [name], [name]   Tue Oct 23, 2017  2:59 AM)     carvedilol (COREG) 6.25 MG tablet [id number] No Take 1 tablet (6.25 mg total) by mouth 2 (two) times daily with a meal. Bensimhon, [name], MD 08/13/2020 3 am Active Self  cefdinir (OMNICEF) 300 MG capsule [id number] No TAKE 1 CAPSULE (300 MG TOTAL) BY MOUTH TWO TIMES DAILY FOR 4 DAYS.  Patient not taking: No sig reported   [name], MD Not Taking Unknown time Active Self  clonazePAM (KLONOPIN) 2 MG tablet [id number] No Take 1 tablet (2 mg total) by mouth 3 (three) times daily. for anxiety  Patient taking differently: Take 2 mg by mouth 3 (three) times daily.   [name], MD 08/13/2020 Unknown time Active Self  cyclobenzaprine (FLEXERIL) 10 MG tablet [id number] No Take 10 mg by mouth 3 (three) times daily. [provider] 08/13/2020 Unknown time Active Self  diclofenac sodium (VOLTAREN) 1 % GEL [id number] No Apply 2 g topically 4 (four) times daily as needed (for pain).  Patient not taking: No sig reported   [name], MD Not Taking Unknown time Active Self  EPINEPHrine 0.3 mg/0.3 mL IJ SOAJ injection [id number] No Inject 0.3 mg into the muscle daily as needed for anaphylaxis. [provider] unk last dose Active Self  FEROSUL 325 (65 Fe) MG tablet [id number] No Take 325 mg by mouth daily. [provider] 08/13/2020 Unknown time Active Self           Med Note [name], [name]   Tue Oct 23, 2017  2:57 AM)    glucose blood test strip [id number] No Use as instructed to check blood sugar up to 4 times daily  Patient taking differently: 1 each by Other route See admin instructions. Use as instructed to check blood sugar up to 4 times daily   [name], DO unk unk Active Self  guaiFENesin (MUCINEX) 600 MG 12 hr tablet [id number] No Take 2 tablets (1,200 mg total) by mouth 2 (two) times daily. Regalado, Belkys A, MD 08/13/2020 Unknown time Active Self  ipratropium-albuterol (DUONEB) 0.5-2.5 (3) MG/3ML SOLN [id number] No Take 3 mLs by nebulization 4 (four) times daily.   [provider] 08/12/2020 Unknown time Active Self           Med Note [name], [name]   Tue Oct 23, 2017  2:57 AM)    levothyroxine (SYNTHROID, LEVOTHROID) 112 MCG tablet [id number] No Take 224 mcg by mouth daily. [provider] 08/13/2020 Unknown time Active Self           Med Note [name], [name]   Tue Oct 23, 2017  2:57 AM)    lisinopril (PRINIVIL,ZESTRIL) 5 MG tablet [id number] No Take 1 tablet (5 mg total) by mouth daily. Please call for office visit [phone]  Patient taking differently: Take 5 mg by mouth daily.   [name], MD 08/13/2020 Unknown time Active Self           Med Note [name], [name]   Tue  Oct 23, 2017  2:58 AM)    loperamide (IMODIUM) 2 MG capsule [id number] No Take 1 capsule (2 mg total) by mouth 4 (four) times daily as needed for diarrhea or loose stools. [name], MD Past Month Unknown time Active Self  metFORMIN (GLUCOPHAGE) 500 MG tablet [id number] No Take 1 tablet (500 mg total) by mouth 2 (two) times daily with a meal. [name], MD 08/13/2020 Unknown time Active Self           Med Note [name], [name]   Tue Oct 23, 2017  2:58 AM)    nitroGLYCERIN (NITROSTAT) 0.4 MG SL tablet [id number] No Place 1 tablet (0.4 mg total) under the tongue every 5 (five) minutes as needed for chest pain. [name], MD unk last dose Active Self  nystatin cream (MYCOSTATIN) [id number] No Apply 1 application topically 2 (two) times daily as needed for dry skin. Regalado, Belkys A, MD Past Week Unknown time Active Self  omeprazole (PRILOSEC) 20 MG capsule [id number] No Take 20 mg by mouth 2 (two) times daily before a meal. [provider] 08/13/2020 Unknown time Active Self  oxyCODONE-acetaminophen (PERCOCET) 10-325 MG tablet [id number] No Take 1 tablet by mouth every 6 (six) hours as needed for pain. [provider] 08/13/2020 Unknown time Active Self           Med Note Broadus John, [facility]   Fri Aug 13, 2020  9:48 PM)    OXYGEN [id number]  No Inhale 2-3 L into the lungs continuous. [provider] 08/13/2020 Unknown time Active Self           Med Note Dorina Hoyer, [facility]   Mon Apr 12, 2020 10:08 AM)    polyethylene glycol (MIRALAX / GLYCOLAX) 17 g packet [id number] No Take 17 g by mouth daily as needed for mild constipation. [provider] Past Week Unknown time Active Self  potassium chloride SA (K-DUR,KLOR-CON) 20 MEQ tablet [id number] No Take 1 tablet (20 mEq total) by mouth 2 (two) times daily. [name], MD 08/13/2020 Unknown time Active Self  QC STOOL SOFTENER PLS LAXATIVE 8.6-50 MG tablet [id number] No Take 1 tablet by mouth at bedtime. [provider] 08/13/2020 Unknown time Active Self           Med Note [name], [name]   Tue Oct 23, 2017  2:56 AM)    rosuvastatin (CRESTOR) 10 MG tablet [id number] No Take 10 mg by mouth every evening. [provider] 08/13/2020 Unknown time Active Self  torsemide (DEMADEX) 20 MG tablet [id number] No TAKE 4 (FOUR) TABLETS BY MOUTH DAILY (2AM+ 2NOON)  Patient taking differently: Take 40 mg by mouth 2 (two) times daily.   [name], MD 08/13/2020 Unknown time Active   traZODone (DESYREL) 50 MG tablet [id number] No Take 100 mg by mouth at bedtime. [provider] 08/12/2020 Unknown time Active Self          Patient Active Problem List   Diagnosis Date Noted  . Acute on chronic respiratory failure with hypoxia and hypercapnia ([place]) 04/18/2020  . Acute on chronic respiratory failure with hypoxia ([place]) 04/18/2020  . Hemoptysis 04/18/2020  . Acute metabolic encephalopathy 99/83/3825  . Acute encephalopathy 04/18/2020  . Chronic diastolic CHF (congestive heart failure) ([place]) 04/18/2020  . Sepsis ([place]) 04/18/2020  . Morbid obesity with BMI of 50.0-59.9, adult ([place])   . Fall at home, initial encounter 06/23/2018  . HNP (herniated nucleus pulposus), thoracic 06/23/2018  . CHF (congestive heart failure) (  [place]) 06/18/2018  . Hypertensive disorder  08/28/2017  . Obesity 08/28/2017  . COPD exacerbation ([place]) 08/04/2017  . Acute and chronic respiratory failure with hypercapnia ([place])   . Pulmonary HTN ([place])   . Genetic testing 02/27/2017  . Family history of thyroid cancer   . Hypomagnesemia 10/03/2016  . Atypical chest pain   . HLD (hyperlipidemia) 09/23/2016  . Gout 09/23/2016  . DVT (deep vein thrombosis) in pregnancy 09/23/2016  . Thyroid cancer ([place]) 09/23/2016  . Hypocalcemia 09/23/2016  . AKI (acute kidney injury) ([place]) 09/23/2016  . Acute pulmonary edema (H[facility])   . Acute hypoxemic respiratory failure ([place])   . Ventilator dependent ([place])   . Chronic respiratory failure with hypoxia and hypercapnia ([place]) 07/16/2016  . CAP (community acquired pneumonia) 01/23/2016  . 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 Pulmonary hypertension ([place]) 01/23/2016  . Obesity hypoventilation syndrome ([place]) 08/26/2015  . Dyslipidemia associated with type 2 diabetes mellitus ([place]) 04/24/2015  . Leukocytosis 04/24/2015  . Controlled type 2 diabetes mellitus without complication, without long-term current use of insulin ([place]) 04/24/2015  . Anxiety 04/24/2015  . Benign essential HTN 04/24/2015  . Normocytic anemia 04/24/2015  . OSA (obstructive sleep apnea) 05/10/2012  . Tobacco abuse 07/04/2011    Conditions to be addressed/monitored per PCP order:  assistance  Care Plan : General Plan of Care (Adult)  Updates made by Ethelda Chick since 08/27/2020 12:00 AM    Problem: Barriers to Treatment     Long-Range Goal: Barriers to Treatment Identified and Managed   Start Date: 05/03/2020  Expected End Date: 08/01/2020  Recent Progress: Not on track  Priority: High  Note:   [place] (see longitudinal plan of care for additional care plan information)  Current Barriers:  . Transportation barriers-patient utilizing [employer] transportation as needed. . Limited access to caregiver-patient  needs PCS, currently not receiving services she is expecting.  Patient states she is now receiving PCS.  Nurse Clinical Goal(s):  Marland Kitchen Over the next 30 days, patient will work with [employer] to address needs related to transportation. Marland Kitchen Update 05/12/20:  Patient has used [employer] and is very pleased. . Over the next 90 days, patient will attend all scheduled medical appointments. Marland Kitchen RNCM will make referral to SW for assistance with PCS . Update 05/20/20:  Patient states she is now receiving PCS from [facility] 3 days a week for 4 hours each day. Marland Kitchen Update 05/27/20: BSW spoke with patient about PCS, she stated someone usually comes in 3 days a week for a few hours. She stated her aide was off today. BSW contacted [place] and spoke with [facility], she stated that patient has 13 hours per week with 6 days a week, however patient chooses to have an aide come out 3 days a week so that she can have more hours. The aide assist with most ADL's. Marland Kitchen Update 06/04/20: Patient stated she is ready to move to [facility]. She has already started the process but wants to save some money first and she has to wait on her nephew who will drive the uhaul truck. Marland Kitchen Update 06/24/20: BSW contacted patient to follow up on her moving to [facility], she stated she is still saving money. Patient did cut call short because she was sleeping, patient stated she was not in need of any resources at this time. Marland Kitchen Update 08/27/20: Bsw received information from [facility] about patient needing  some batteries for her remote. BSW contacted patient to see if any resources were needed. Patient stated she contacted [employer] and they informed her it would cost 40 dollars to replace her remote, patient states she only needs batteries. BSW asked if patient had anyone to call that could bring her some batteries, patient stated no. She states her daughter is mad at her right now and working so she does not  talk to her, patient states she has not family to call. BSW asked if her aide could help, patient states she has not had an aide in about a week, she had to let her last aide go because she smelled like marijuana and they have not replaced her. BSW contacted patients healthcare agency [facility] at [phone] and left a voicemail.  Interventions: . Inter-disciplinary care team collaboration (see longitudinal plan of care) . Referred patient to community resources care guide team for assistance with transportation. . Provided patient with information about [employer]. . Discussed plans with patient for ongoing care management follow up and provided patient with direct contact information for care management team . Advised patient to call [facility]. Marland Kitchen Collaborated with [facility] regarding transportation. . Assisted patient/caregiver with obtaining information about health plan benefits . Collaborated with Social Work regarding [employer]. Marland Kitchen Update 06/14/20:  Patient continues to utilize [employer], aide, [employer] as needed-has missed appointments due to transportation "no shows"-will continue to follow.  Patient Self Care Activities:  . Patient will call [employer]. Marland Kitchen Update 06/07/20:  Patient continues to utilize [facility] with success. Marland Kitchen Update 06/14/20:  Patient has missed appointments due to transportation no shows.  [facility] transportation has been utilized as needed.        Follow up:  Patient agrees to Care Plan and Follow-up.  Plan: The Managed Medicaid care management team will reach out to the patient again over the next 14 days.  Date/time of next scheduled Social Work care management/care coordination outreach:  09/16/20  Mickel Fuchs, Arita Miss, [place] Managed Medicaid Team  [phone]

## 2020-08-27 NOTE — Patient Instructions (Signed)
Visit Information  Ms. Oettinger was given information about Medicaid Managed Care team care coordination services as a part of their Wellcare Medicaid benefit. Khalise Stabenow verbally consented to engagement with the Medicaid Managed Care team.   For questions related to your Wellcare Medicaid health plan, please call: [phone] or go here:[email or web address]  If you would like to schedule transportation through your Wellcare Medicaid plan, please call the following number at least 2 days in advance of your appointment: [phone].  Call the Behavioral Health Crisis Line at [phone], at any time, 24 hours a day, 7 days a week. If you are in danger or need immediate medical attention call 911.  Ms. Bohlen - following are the goals we discussed in your visit today:   Goals Addressed   None      Social Worker will follow up in 14 days.   Jacayla Nordell, BSW, MHA [facility]  [place]  High Risk Managed Medicaid Team  [phone]   Following is a copy of your plan of care:     

## 2020-08-27 NOTE — Patient Outreach (Signed)
Care Coordination  08/27/2020  Nemesis Rainwater 10/22/1960 [id number]   RNCM returned patient's phone call.  RNCM answered patient's questions.    [name] RN, BSN [place]  [facility] - Managed Medicaid High Risk [phone].

## 2020-08-30 ENCOUNTER — Other Ambulatory Visit: Payer: Self-pay | Admitting: Obstetrics and Gynecology

## 2020-08-30 ENCOUNTER — Other Ambulatory Visit: Payer: Self-pay

## 2020-08-30 NOTE — Patient Outreach (Signed)
Care Coordination  08/30/2020  Michele Owens 08-24-1960 [id number]   RNCM returned patient's phone call regarding PCS.  Patient having trouble getting PCS.  RNCM notified SW.  SW called  PCS Provider-awaiting return  Call.  [name] RN, BSN   [facility] - Managed Medicaid High Risk [phone].

## 2020-08-30 NOTE — Patient Instructions (Signed)
Visit Information  Ms. Yahr was given information about Medicaid Managed Care team care coordination services as a part of their [facility] Medicaid benefit. [name] verbally consented to engagement with the [facility] Managed Care team.   For questions related to your [facility] health plan, please call: [phone] or go here:[email or web address]  If you would like to schedule transportation through your [facility] plan, please call the following number at least 2 days in advance of your appointment: [phone].  Call the [facility] at [phone], at any time, 24 hours a day, 7 days a week. If you are in danger or need immediate medical attention call 911.  Ms. Macias - following are the goals we discussed in your visit today:  Goals Addressed            This Visit's Progress   . Protect My Health       Timeframe:  Long-Range Goal Priority:  Medium Start Date:    04/23/20                         Expected End Date:       11/24/20               Follow Up Date 09/30/20   - schedule appointment for vaccines needed due to my age or health - schedule recommended health tests (blood work, mammogram, colonoscopy, pap test) - schedule and keep appointment for annual check-up  Patient has PT appointment 05/19/20. Update 05/20/20:  Patient unable to attend PT appointment on 05/19/20.  Has next PT appointment 05/26/20.  Cardiology appointment 05/28/20. Update 05/24/20:  Attempted to reach patient today-she is at Urgent Care with stomach pain.  Will reschedule. Update 05/25/20:  Patient seen at Urgent Care and [place] 05/24/20.  Patient with abscess- given antibiotics. Update 06/01/20:  Patient continues to improve, HHRN cleaning and changing dressing. Update 06/07/20:  Patient states abscess continues to improve.  Has PT on Wednesday, 06/09/20. Update 06/14/20:  Abscess healed.  Patient missed PT appointment as transportation did not pick her up-appointment  rescheduled to 06/16/20. Update 06/17/20:  Patient attended PT appointment yesterday and also attended appointment at [place] this week-Patient states she feels better. Update 08/24/20:  Patient has appointment at [facility] 08/25/20 at 0930. Update 08/30/20:  Patient without complaints today, awaiting therapy services to start.    The patient verbalized understanding of instructions provided today and declined a print copy of patient instruction materials.   The Managed Medicaid care management team will reach out to the patient again over the next 30 days.  The patient has been provided with contact information for the Managed Medicaid care management team and has been advised to call with any health related questions or concerns.   [name] RN, BSN [place]  [facility] - Managed Medicaid High Risk [phone].  Following is a copy of your plan of care:  Patient Care Plan: General Plan of Care (Adult)    Problem Identified: Health Promotion or Disease Self-Management (General Plan of Care)   Priority: Medium  Onset Date: 04/23/2020    Problem Identified: Barriers to Treatment   Priority: High  Onset Date: 04/23/2020    Long-Range Goal: Barriers to Treatment Identified and Managed   Start Date: 05/03/2020  Expected End Date: 11/30/2020  Recent Progress: Not on track  Priority: High  Note:   CARE PLAN ENTRY Medicaid Managed Care (see longitudinal plan  of care for additional care plan information)  Current Barriers:  . Transportation barriers-patient utilizing [employer] and [employer] as needed. . Limited access to caregiver-patient needs PCS, currently not receiving services she is expecting.   Nurse Clinical Goal(s):  Marland Kitchen Over the next 30 days, patient will work with [employer] to address needs related to transportation. Marland Kitchen Update 05/12/20:  Patient has used [employer] and is very  pleased. . Over the next 90 days, patient will attend all scheduled medical appointments. Marland Kitchen RNCM will make referral to SW for assistance with PCS . Update 05/20/20:  Patient states she is now receiving PCS from [facility] 3 days a week for 4 hours each day. Marland Kitchen Update 05/27/20: BSW spoke with patient about PCS, she stated someone usually comes in 3 days a week for a few hours. She stated her aide was off today. BSW contacted [place] and spoke with [facility], she stated that patient has 13 hours per week with 6 days a week, however patient chooses to have an aide come out 3 days a week so that she can have more hours. The aide assist with most ADL's. Marland Kitchen Update 06/04/20: Patient stated she is ready to move to [facility]. She has already started the process but wants to save some money first and she has to wait on her nephew who will drive the uhaul truck. Marland Kitchen Update 06/24/20: BSW contacted patient to follow up on her moving to [facility], she stated she is still saving money. Patient did cut call short because she was sleeping, patient stated she was not in need of any resources at this time. Marland Kitchen Update 08/27/20: Bsw received information from [facility] about patient needing some batteries for her remote. BSW contacted patient to see if any resources were needed. Patient stated she contacted [employer] and they informed her it would cost 40 dollars to replace her remote, patient states she only needs batteries. BSW asked if patient had anyone to call that could bring her some batteries, patient stated no. She states her daughter is mad at her right now and working so she does not talk to her, patient states she has not family to call. BSW asked if her aide could help, patient states she has not had an aide in about a week, she had to let her last aide go because she smelled like marijuana and they have not replaced her. BSW contacted patients healthcare agency [facility] at [phone] and  left a voicemail.  Interventions: . Inter-disciplinary care team collaboration (see longitudinal plan of care) . Referred patient to community resources care guide team for assistance with transportation. . Provided patient with information about [facility] and [employer]. . Discussed plans with patient for ongoing care management follow up and provided patient with direct contact information for care management team . Advised patient to call [place] or [facility] [name] with [employer] [place] and [place] regarding transportation. . Assisted patient/caregiver with obtaining information about health plan benefits . Collaborated with Social Work regarding [employer]. Marland Kitchen Update 08/30/20:  Patient continues to have issues with PCS-SW is investigating. Marland Kitchen Update 06/14/20:  Patient continues to utilize [employer], aide, [employer] as needed-has missed appointments due to transportation "no shows"-will continue to follow. Marland Kitchen Update 08/30/20:  patient has been successful in attending appointments with [facility] and [facility] transportation.  Patient Self Care Activities:  . Patient will call [employer]. Marland Kitchen Update 06/07/20:  Patient  continues to utilize [facility] with success. Marland Kitchen Update 06/14/20:  Patient has missed appointments due to transportation no shows.  [facility] transportation has been utilized as needed.          Patient Care Plan: General Plan of Care (Adult)    Problem Identified: Health Promotion or Disease Self-Management (General Plan of Care)   Priority: High  Onset Date: 04/23/2020    Long-Range Goal: Self-Management Plan Developed   Start Date: 04/23/2020  Expected End Date: 07/22/2020  Recent Progress: Not on track  Priority: Medium  Note:   Current Barriers:  . Chronic Disease Management support and education needs.  Patient with abdominal wall infection-seen at [facility] ED 05/24/20 - I&D  performed. Marland Kitchen Update 06/01/20:  Patient states she is improving-HHRN is cleaning and dressing infection. Marland Kitchen Update 06/07/20:  Infection continues to improve per patient, HHRN continues to clean and dress infection site. Marland Kitchen Update 06/17/20:  Infection improved, healed.  Nurse Case Manager Clinical Goal(s):  Marland Kitchen Over the next 30 days, patient will attend all scheduled medical appointments:  . Over the next 30 days, patient will work with CM team pharmacist to review medications. Marland Kitchen Update 05/20/20:  Patient has appointment with Pharmacist 05/21/20. Marland Kitchen Update 05/25/20:  Patient declines to speak with Pharmacist at this time. Marland Kitchen Update 08/30/20:  Patient working with Pharmacist for medication review.  Interventions:  . Inter-disciplinary care team collaboration (see longitudinal plan of care) . Evaluation of current treatment plan and patient's adherence to plan as established by provider. . Reviewed medications with patient. [name] with pharmacist regarding medications. Marland Kitchen Update 08/30/20:  Pharmacy continues to follow. . Discussed plans with patient for ongoing care management follow up and provided patient with direct contact information for care management team . Reviewed scheduled/upcoming provider appointments.  Marland Kitchen Update 05/20/20:  Patient unable to attend PT appointment 05/19/20, has next appointment 05/26/20.  Has Cardiology appointment 05/28/20.  Reviewed [place] information with patient again and with aide. Marland Kitchen Update 06/07/20:  Patient has PT appointment 06/09/20. Marland Kitchen Update 06/17/20:  patient attended PT appointment 06/16/20 and also attended appointment at [facility] this week-patient states she feels better. Marland Kitchen Pharmacy referral for medication review. Marland Kitchen Update 05/24/20: Pharmacist has attempted to speak with patient and has been unable to.  Patient at Urgent Care today with stomach pain.  RNCM will reschedule appointment with patient and speak with her regarding importance of speaking  with Pharmacist. . Update 05/25/20: Patient declines to speak with Pharmacist at this time. Marland Kitchen Update 08/30/20:  Patient is working with Pharmacist to manage medications.  Patient Goals/Self-Care Activities Over the next 30 days, patient will:  -Attends all scheduled provider appointments Calls pharmacy for medication refills Calls provider office for new concerns or questions  Follow Up Plan: The Managed Medicaid care management team will reach out to the patient again over the next 30 days.  The patient has been provided with contact information for the Managed Medicaid care management team and has been advised to call with any health related questions or concerns.

## 2020-08-30 NOTE — Patient Outreach (Signed)
Medicaid Managed Care   Nurse Care Manager Note  08/30/2020 Name:  Michele Owens MRN:  [id number] DOB:  [birth year]  Michele Owens is an 60 y.o. year old female who is a primary patient of [name], MD.  The [facility] Managed Care Coordination team was consulted for assistance with:    chronic healthcare management needs.  Michele Owens was given information about Medicaid Managed Care Coordination team services today. Michele Owens agreed to services and verbal consent obtained.  Engaged with patient by telephone for follow up visit in response to provider referral for case management and/or care coordination services.   Assessments/Interventions:  Review of past medical history, allergies, medications, health status, including review of consultants reports, laboratory and other test data, was performed as part of comprehensive evaluation and provision of chronic care management services.  SDOH (Social Determinants of Health) assessments and interventions performed:   Care Plan  Allergies  Allergen Reactions  . Bee Venom Swelling and Other (See Comments)    "All over my body" (swelling)  . Fioricet [Butalbital-Apap-Caffeine] Nausea And Vomiting and Rash  . Ibuprofen Rash and Other (See Comments)    Severe rash  . Lamisil [Terbinafine] Rash and Other (See Comments)    Pt states this causes her to "feel funny"  . Nsaids Other (See Comments)    Per MD's orders     Medications Reviewed Today    Reviewed by [name], RN (Registered Nurse) on 08/30/20 at [place] List Status: <None>  Medication Order Taking? Sig Documenting Provider Last Dose Status Informant  albuterol (PROVENTIL HFA;VENTOLIN HFA) 108 (90 Base) MCG/ACT inhaler [id number] No Inhale 1-2 puffs into the lungs every 6 (six) hours as needed for wheezing or shortness of breath. Margette Fast, MD 08/13/2020 Unknown time Active Self  allopurinol (ZYLOPRIM) 100 MG tablet [id number] No Take 100 mg by mouth daily.  [provider] 08/13/2020 Unknown time Active Self           Med Note Michele Owens, [name]   Tue Oct 23, 2017  2:58 AM)    Michele Owens ELLIPTA 62.5-25 MCG/INH AEPB [id number] No Inhale 1 puff into the lungs daily.  [provider] 08/13/2020 Unknown time Active Self  benzonatate (TESSALON) 200 MG capsule [id number] No Take 1 capsule (200 mg total) by mouth in the morning, at Owens, and at bedtime. 7 DS Regalado, Belkys A, MD 08/13/2020 Unknown time Active Self  Blood Glucose Monitoring Suppl (ACCU-CHEK AVIVA PLUS) w/Device KIT [id number] No 1 each by Other route daily.  [provider] unk unk Active Self  butalbital-acetaminophen-caffeine (FIORICET) 50-325-40 MG tablet [id number] No Take 1 tablet by mouth 2 (two) times daily as needed for migraine. [provider] Past Month Unknown time Active Self  calcitRIOL (ROCALTROL) 0.5 MCG capsule [id number] No Take 1 capsule (0.5 mcg total) by mouth daily. Modena Jansky, MD 08/13/2020 Unknown time Active Self           Med Note Michele Owens, [name]   Tue Oct 23, 2017  2:59 AM)    carvedilol (COREG) 6.25 MG tablet [id number] No Take 1 tablet (6.25 mg total) by mouth 2 (two) times daily with a meal. Owens, [name], MD 08/13/2020 3 am Active Self  cefdinir (OMNICEF) 300 MG capsule [id number] No TAKE 1 CAPSULE (300 MG TOTAL) BY MOUTH TWO TIMES DAILY FOR 4 DAYS.  Patient not taking: No sig reported   Michele Cheney, MD Not Taking Unknown time Active Self  clonazePAM [name])  2 MG tablet [id number] No Take 1 tablet (2 mg total) by mouth 3 (three) times daily. for anxiety  Patient taking differently: Take 2 mg by mouth 3 (three) times daily.   Michele Pence, MD 08/13/2020 Unknown time Active Self  cyclobenzaprine (FLEXERIL) 10 MG tablet [id number] No Take 10 mg by mouth 3 (three) times daily. [provider] 08/13/2020 Unknown time Active Self  diclofenac sodium (VOLTAREN) 1 % GEL [id number] No Apply 2 g topically 4 (four) times daily as  needed (for pain).  Patient not taking: No sig reported   Michele Morton, MD Not Taking Unknown time Active Self  EPINEPHrine 0.3 mg/0.3 mL IJ SOAJ injection [id number] No Inject 0.3 mg into the muscle daily as needed for anaphylaxis. [provider] unk last dose Active Self  FEROSUL 325 (65 Fe) MG tablet [id number] No Take 325 mg by mouth daily. [provider] 08/13/2020 Unknown time Active Self           Med Note Michele Owens, [name]   Tue Oct 23, 2017  2:57 AM)    glucose blood test strip [id number] No Use as instructed to check blood sugar up to 4 times daily  Patient taking differently: 1 each by Other route See admin instructions. Use as instructed to check blood sugar up to 4 times daily   Michele Phi, DO unk unk Active Self  guaiFENesin (MUCINEX) 600 MG 12 hr tablet [id number] No Take 2 tablets (1,200 mg total) by mouth 2 (two) times daily. Regalado, Belkys A, MD 08/13/2020 Unknown time Active Self  ipratropium-albuterol (DUONEB) 0.5-2.5 (3) MG/3ML SOLN [id number] No Take 3 mLs by nebulization 4 (four) times daily.  [provider] 08/12/2020 Unknown time Active Self           Med Note Michele Owens, [name]   Tue Oct 23, 2017  2:57 AM)    levothyroxine (SYNTHROID, LEVOTHROID) 112 MCG tablet [id number] No Take 224 mcg by mouth daily. [provider] 08/13/2020 Unknown time Active Self           Med Note Michele Owens, [name]   Tue Oct 23, 2017  2:57 AM)    lisinopril (PRINIVIL,ZESTRIL) 5 MG tablet [id number] No Take 1 tablet (5 mg total) by mouth daily. Please call for office visit [phone]  Patient taking differently: Take 5 mg by mouth daily.   Michele Dresser, MD 08/13/2020 Unknown time Active Self           Med Note Michele Owens, [name]   Tue Oct 23, 2017  2:58 AM)    loperamide (IMODIUM) 2 MG capsule [id number] No Take 1 capsule (2 mg total) by mouth 4 (four) times daily as needed for diarrhea or loose stools. Michele Fuel, MD Past Month Unknown time Active Self   metFORMIN (GLUCOPHAGE) 500 MG tablet [id number] No Take 1 tablet (500 mg total) by mouth 2 (two) times daily with a meal. Michele Redwood, MD 08/13/2020 Unknown time Active Self           Med Note Michele Owens, [name]   Tue Oct 23, 2017  2:58 AM)    nitroGLYCERIN (NITROSTAT) 0.4 MG SL tablet [id number] No Place 1 tablet (0.4 mg total) under the tongue every 5 (five) minutes as needed for chest pain. Michele Morton, MD unk last dose Active Self  nystatin cream (MYCOSTATIN) [id number] No Apply 1 application topically 2 (two) times daily as needed for dry skin. Regalado, Cassie Freer, MD Past Week Unknown time Active  Self  omeprazole (PRILOSEC) 20 MG capsule [id number] No Take 20 mg by mouth 2 (two) times daily before a meal. [provider] 08/13/2020 Unknown time Active Self  oxyCODONE-acetaminophen (PERCOCET) 10-325 MG tablet [id number] No Take 1 tablet by mouth every 6 (six) hours as needed for pain. [provider] 08/13/2020 Unknown time Active Self           Med Note Broadus John, [facility]   Fri Aug 13, 2020  9:48 PM)    OXYGEN [id number] No Inhale 2-3 L into the lungs continuous. [provider] 08/13/2020 Unknown time Active Self           Med Note Dorina Hoyer, [facility]   Mon Apr 12, 2020 10:08 AM)    polyethylene glycol (MIRALAX / GLYCOLAX) 17 g packet [id number] No Take 17 g by mouth daily as needed for mild constipation. [provider] Past Week Unknown time Active Self  potassium chloride SA (K-DUR,KLOR-CON) 20 MEQ tablet [id number] No Take 1 tablet (20 mEq total) by mouth 2 (two) times daily. Michele Dresser, MD 08/13/2020 Unknown time Active Self  QC STOOL SOFTENER PLS LAXATIVE 8.6-50 MG tablet [id number] No Take 1 tablet by mouth at bedtime. [provider] 08/13/2020 Unknown time Active Self           Med Note Michele Owens, [name]   Tue Oct 23, 2017  2:56 AM)    rosuvastatin (CRESTOR) 10 MG tablet [id number] No Take 10 mg by mouth every evening. [provider]  08/13/2020 Unknown time Active Self  torsemide (DEMADEX) 20 MG tablet [id number] No TAKE 4 (FOUR) TABLETS BY MOUTH DAILY (2AM+ 2NOON)  Patient taking differently: Take 40 mg by mouth 2 (two) times daily.   Michele Dresser, MD 08/13/2020 Unknown time Active   traZODone (DESYREL) 50 MG tablet [id number] No Take 100 mg by mouth at bedtime. [provider] 08/12/2020 Unknown time Active Self          Patient Active Problem List   Diagnosis Date Noted  . Acute on chronic respiratory failure with hypoxia and hypercapnia ([place]) 04/18/2020  . Acute on chronic respiratory failure with hypoxia ([place]) 04/18/2020  . Hemoptysis 04/18/2020  . Acute metabolic encephalopathy 54/00/8676  . Acute encephalopathy 04/18/2020  . Chronic diastolic CHF (congestive heart failure) ([place]) 04/18/2020  . Sepsis ([place]) 04/18/2020  . Morbid obesity with BMI of 50.0-59.9, adult ([place])   . Fall at home, initial encounter 06/23/2018  . HNP (herniated nucleus pulposus), thoracic 06/23/2018  . CHF (congestive heart failure) ([place]) 06/18/2018  . Hypertensive disorder 08/28/2017  . Obesity 08/28/2017  . COPD exacerbation ([place]) 08/04/2017  . Acute and chronic respiratory failure with hypercapnia ([place])   . Pulmonary HTN ([place])   . Genetic testing 02/27/2017  . Family history of thyroid cancer   . Hypomagnesemia 10/03/2016  . Atypical chest pain   . HLD (hyperlipidemia) 09/23/2016  . Gout 09/23/2016  . DVT (deep vein thrombosis) in pregnancy 09/23/2016  . Thyroid cancer ([place]) 09/23/2016  . Hypocalcemia 09/23/2016  . AKI (acute kidney injury) ([place]) 09/23/2016  . Acute pulmonary edema (H[facility])   . Acute hypoxemic respiratory failure ([name])   . Ventilator dependent ([place])   . Chronic respiratory failure with hypoxia and hypercapnia ([place]) 07/16/2016  . CAP (community acquired pneumonia) 01/23/2016  . 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 Pulmonary hypertension ([place]) 01/23/2016  . Obesity  hypoventilation syndrome ([place]) 08/26/2015  . Dyslipidemia associated with type 2 diabetes mellitus ([place])  04/24/2015  . Leukocytosis 04/24/2015  . Controlled type 2 diabetes mellitus without complication, without long-term current use of insulin ([place]) 04/24/2015  . Anxiety 04/24/2015  . Benign essential HTN 04/24/2015  . Normocytic anemia 04/24/2015  . OSA (obstructive sleep apnea) 05/10/2012  . Tobacco abuse 07/04/2011    Conditions to be addressed/monitored per PCP order:  chronic healthcare management needs, OSA, DM2, obesity, anxiety  Care Plan : General Plan of Care (Adult)  Updates made by [name], RN since 08/30/2020 12:00 AM    Problem: Barriers to Treatment   Priority: High  Onset Date: 04/23/2020    Long-Range Goal: Barriers to Treatment Identified and Managed   Start Date: 05/03/2020  Expected End Date: 11/30/2020  Recent Progress: Not on track  Priority: High  Note:   [place] (see longitudinal plan of care for additional care plan information)  Current Barriers:  . Transportation barriers-patient utilizing [employer] and [employer] as needed. . Limited access to caregiver-patient needs PCS, currently not receiving services she is expecting.   Nurse Clinical Goal(s):  Marland Kitchen Over the next 30 days, patient will work with [employer] to address needs related to transportation. Marland Kitchen Update 05/12/20:  Patient has used [employer] and is very pleased. . Over the next 90 days, patient will attend all scheduled medical appointments. Marland Kitchen RNCM will make referral to SW for assistance with PCS . Update 05/20/20:  Patient states she is now receiving PCS from [facility] 3 days a week for 4 hours each day. Marland Kitchen Update 05/27/20: BSW spoke with patient about PCS, she stated someone usually comes in 3 days a week for a few hours. She stated her aide was off today. BSW contacted [place] and spoke with  [facility], she stated that patient has 13 hours per week with 6 days a week, however patient chooses to have an aide come out 3 days a week so that she can have more hours. The aide assist with most ADL's. Marland Kitchen Update 06/04/20: Patient stated she is ready to move to [facility]. She has already started the process but wants to save some money first and she has to wait on her nephew who will drive the uhaul truck. Marland Kitchen Update 06/24/20: BSW contacted patient to follow up on her moving to [facility], she stated she is still saving money. Patient did cut call short because she was sleeping, patient stated she was not in need of any resources at this time. Marland Kitchen Update 08/27/20: Bsw received information from [facility] about patient needing some batteries for her remote. BSW contacted patient to see if any resources were needed. Patient stated she contacted [employer] and they informed her it would cost 40 dollars to replace her remote, patient states she only needs batteries. BSW asked if patient had anyone to call that could bring her some batteries, patient stated no. She states her daughter is mad at her right now and working so she does not talk to her, patient states she has not family to call. BSW asked if her aide could help, patient states she has not had an aide in about a week, she had to let her last aide go because she smelled like marijuana and they have not replaced her. BSW contacted patients healthcare agency [facility] at [phone] and left a voicemail.  Interventions: . Inter-disciplinary care team collaboration (see longitudinal plan of care) . Referred patient to community resources care guide team  for assistance with transportation. . Provided patient with information about [facility] and [employer]. . Discussed plans with patient for ongoing care management follow up and provided patient with direct contact information for care management team . Advised patient to call [place] or [facility]. [name] with [employer] [place] and [place] regarding transportation. . Assisted patient/caregiver with obtaining information about health plan benefits . Collaborated with Social Work regarding [employer]. Marland Kitchen Update 08/30/20:  Patient continues to have issues with PCS-SW is investigating. Marland Kitchen Update 06/14/20:  Patient continues to utilize [employer], aide, [employer] as needed-has missed appointments due to transportation "no shows"-will continue to follow. Marland Kitchen Update 08/30/20:  patient has been successful in attending appointments with [facility] and [facility] transportation.  Patient Self Care Activities:  . Patient will call [employer]. Marland Kitchen Update 06/07/20:  Patient continues to utilize [facility] with success. Marland Kitchen Update 06/14/20:  Patient has missed appointments due to transportation no shows.  [facility] transportation has been utilized as needed.   Follow Up:  Patient agrees to Care Plan and Follow-up.  Plan: The Managed Medicaid care management team will reach out to the patient again over the next 30 days. and The patient has been provided with contact information for the Managed Medicaid care management team and has been advised to call with any health related questions or concerns.  Date/time of next scheduled RN care management/care coordination outreach:  09/30/20 at 0900.

## 2020-09-02 ENCOUNTER — Other Ambulatory Visit: Payer: Self-pay | Admitting: Obstetrics and Gynecology

## 2020-09-02 NOTE — Patient Outreach (Signed)
Care Coordination  09/02/2020  Michele Owens 1960-07-03 [id number]   RNCM returned patient's phone call, no answer, left message.  [name] RN, BSN [place]  [facility] - Managed Medicaid High Risk [phone].

## 2020-09-09 ENCOUNTER — Other Ambulatory Visit: Payer: Self-pay

## 2020-09-09 ENCOUNTER — Ambulatory Visit: Payer: Medicaid Other | Admitting: Podiatry

## 2020-09-09 ENCOUNTER — Other Ambulatory Visit: Payer: Self-pay | Admitting: Obstetrics and Gynecology

## 2020-09-09 NOTE — Patient Outreach (Signed)
Care Coordination  09/09/2020  Michele Owens 1960/08/19 [id number]   RNCM returned patient's phone call.  Patient still has no PCS.  Patient without aide for over a month.  RNCM sent message for follow up.  [name] RN, BSN [place]  [facility] - Managed Medicaid High Risk [phone].  Marland Kitchen

## 2020-09-12 DIAGNOSIS — I110 Hypertensive heart disease with heart failure: Secondary | ICD-10-CM | POA: Diagnosis not present

## 2020-09-13 DIAGNOSIS — I110 Hypertensive heart disease with heart failure: Secondary | ICD-10-CM | POA: Diagnosis not present

## 2020-09-14 DIAGNOSIS — I110 Hypertensive heart disease with heart failure: Secondary | ICD-10-CM | POA: Diagnosis not present

## 2020-09-15 DIAGNOSIS — I110 Hypertensive heart disease with heart failure: Secondary | ICD-10-CM | POA: Diagnosis not present

## 2020-09-16 ENCOUNTER — Other Ambulatory Visit: Payer: Self-pay

## 2020-09-16 DIAGNOSIS — I110 Hypertensive heart disease with heart failure: Secondary | ICD-10-CM | POA: Diagnosis not present

## 2020-09-16 NOTE — Patient Outreach (Signed)
Medicaid Managed Care Social Work Note  09/16/2020 Name:  Michele Owens MRN:  [id number] DOB:  18-May-1960  Michele Owens is an 60 y.o. year old female who is a primary patient of [name], MD.  The [place] team was consulted for assistance with:   Home Health  Ms. Kube was given information about Medicaid Managed CareCoordination services today. [name] agreed to services and verbal consent obtained.  Engaged with patient  for by telephone forfollow up visit in response to referral for case management and/or care coordination services.   Assessments/Interventions:  Review of past medical history, allergies, medications, health status, including review of consultants reports, laboratory and other test data, was performed as part of comprehensive evaluation and provision of chronic care management services.  SDOH: (Social Determinant of Health) assessments and interventions performed:  BSW spoke with patient and she stated she has not had an aide in about 1 month. BSW contacted [facility] and did not get an answer and was unable to leave a voicemail. BSW contacted [facility] and spoke with patient's CM Jalisa Sacco. Ubaldo Glassing stated that patient has had 5 aides since October and she gets rid of them if they don't take her somewhere. Clint Strupp states aides do not have to have transportation to take member places.  Zeah Germano stated [facility] is currenlty in the process of looking for another aide for patient. Patient will fire an aide on Friday and expect a new one on Monday. Patient's CM Ubaldo Glassing is very involved with patients Home health agency and states she has another follow up with patient on 6/20.   Advanced Directives Status:  Not addressed in this encounter.  Care Plan                 Allergies  Allergen Reactions   Bee Venom Swelling and Other (See Comments)    "All over my body" (swelling)   Fioricet [Butalbital-Apap-Caffeine] Nausea And Vomiting and  Rash   Ibuprofen Rash and Other (See Comments)    Severe rash   Lamisil [Terbinafine] Rash and Other (See Comments)    Pt states this causes her to "feel funny"   Nsaids Other (See Comments)    Per MD's orders     Medications Reviewed Today     Reviewed by [name], RN (Registered Nurse) on 08/30/20 at [place] List Status: <None>   Medication Order Taking? Sig Documenting Provider Last Dose Status Informant  albuterol (PROVENTIL HFA;VENTOLIN HFA) 108 (90 Base) MCG/ACT inhaler [id number] No Inhale 1-2 puffs into the lungs every 6 (six) hours as needed for wheezing or shortness of breath. Margette Fast, MD 08/13/2020 Unknown time Active Self  allopurinol (ZYLOPRIM) 100 MG tablet [id number] No Take 100 mg by mouth daily. [provider] 08/13/2020 Unknown time Active Self           Med Note [name], [name]   Tue Oct 23, 2017  2:58 AM)    [name] ELLIPTA 62.5-25 MCG/INH AEPB [id number] No Inhale 1 puff into the lungs daily.  [provider] 08/13/2020 Unknown time Active Self  benzonatate (TESSALON) 200 MG capsule [id number] No Take 1 capsule (200 mg total) by mouth in the morning, at noon, and at bedtime. 7 DS Regalado, Belkys A, MD 08/13/2020 Unknown time Active Self  Blood Glucose Monitoring Suppl (ACCU-CHEK AVIVA PLUS) w/Device KIT [id number] No 1 each by Other route daily.  [provider] unk unk Active Self  butalbital-acetaminophen-caffeine (FIORICET) 50-325-40 MG tablet  [id number] No Take 1 tablet by mouth 2 (two) times daily as needed for migraine. [provider] Past Month Unknown time Active Self  calcitRIOL (ROCALTROL) 0.5 MCG capsule [id number] No Take 1 capsule (0.5 mcg total) by mouth daily. Modena Jansky, MD 08/13/2020 Unknown time Active Self           Med Note [name], [name]   Tue Oct 23, 2017  2:59 AM)    carvedilol (COREG) 6.25 MG tablet [id number] No Take 1 tablet (6.25 mg total) by mouth 2 (two) times daily with a meal. Bensimhon, [name], MD 08/13/2020 3 am Active Self  cefdinir (OMNICEF) 300 MG capsule [id number] No TAKE 1 CAPSULE (300 MG TOTAL) BY MOUTH TWO TIMES DAILY FOR 4 DAYS.  Patient not taking: No sig reported   [name], MD Not Taking Unknown time Active Self  clonazePAM (KLONOPIN) 2 MG tablet [id number] No Take 1 tablet (2 mg total) by mouth 3 (three) times daily. for anxiety  Patient taking differently: Take 2 mg by mouth 3 (three) times daily.   [name], MD 08/13/2020 Unknown time Active Self  cyclobenzaprine (FLEXERIL) 10 MG tablet [id number] No Take 10 mg by mouth 3 (three) times daily. [provider] 08/13/2020 Unknown time Active Self  diclofenac sodium (VOLTAREN) 1 % GEL [id number] No Apply 2 g topically 4 (four) times daily as needed (for pain).  Patient not taking: No sig reported   [name], MD Not Taking Unknown time Active Self  EPINEPHrine 0.3 mg/0.3 mL IJ SOAJ injection [id number] No Inject 0.3 mg into the muscle daily as needed for anaphylaxis. [provider] unk last dose Active Self  FEROSUL 325 (65 Fe) MG tablet [id number] No Take 325 mg by mouth daily. [provider] 08/13/2020 Unknown time Active Self           Med Note [name], [name]   Tue Oct 23, 2017  2:57 AM)    glucose blood test strip [id number] No Use as instructed to check blood sugar up to 4 times daily  Patient taking differently: 1 each by Other route See admin instructions. Use as instructed to check blood sugar up to 4 times daily   [name], DO unk unk Active Self  guaiFENesin (MUCINEX) 600 MG 12 hr tablet [id number] No Take 2 tablets (1,200 mg total) by mouth 2 (two) times daily. Regalado, Belkys A, MD 08/13/2020 Unknown time Active Self  ipratropium-albuterol (DUONEB) 0.5-2.5 (3) MG/3ML SOLN [id number] No Take 3 mLs by nebulization 4 (four) times daily.  [provider] 08/12/2020 Unknown time Active Self           Med Note [name], [name]   Tue Oct 23, 2017  2:57 AM)     levothyroxine (SYNTHROID, LEVOTHROID) 112 MCG tablet [id number] No Take 224 mcg by mouth daily. [provider] 08/13/2020 Unknown time Active Self           Med Note [name], [name]   Tue Oct 23, 2017  2:57 AM)    lisinopril (PRINIVIL,ZESTRIL) 5 MG tablet [id number] No Take 1 tablet (5 mg total) by mouth daily. Please call for office visit [phone]  Patient taking differently: Take 5 mg by mouth daily.   [name], MD 08/13/2020 Unknown time Active Self           Med Note [name], [name]   Tue Oct 23, 2017  2:58 AM)    loperamide (IMODIUM) 2 MG capsule [id number]  No Take 1 capsule (2 mg total) by mouth 4 (four) times daily as needed for diarrhea or loose stools. [name], MD Past Month Unknown time Active Self  metFORMIN (GLUCOPHAGE) 500 MG tablet [id number] No Take 1 tablet (500 mg total) by mouth 2 (two) times daily with a meal. [name], MD 08/13/2020 Unknown time Active Self           Med Note [name], [name]   Tue Oct 23, 2017  2:58 AM)    nitroGLYCERIN (NITROSTAT) 0.4 MG SL tablet [id number] No Place 1 tablet (0.4 mg total) under the tongue every 5 (five) minutes as needed for chest pain. [name], MD unk last dose Active Self  nystatin cream (MYCOSTATIN) [id number] No Apply 1 application topically 2 (two) times daily as needed for dry skin. Regalado, Belkys A, MD Past Week Unknown time Active Self  omeprazole (PRILOSEC) 20 MG capsule [id number] No Take 20 mg by mouth 2 (two) times daily before a meal. [provider] 08/13/2020 Unknown time Active Self  oxyCODONE-acetaminophen (PERCOCET) 10-325 MG tablet [id number] No Take 1 tablet by mouth every 6 (six) hours as needed for pain. [provider] 08/13/2020 Unknown time Active Self           Med Note Broadus John, [facility]   Fri Aug 13, 2020  9:48 PM)    OXYGEN [id number] No Inhale 2-3 L into the lungs continuous. [provider] 08/13/2020 Unknown time Active Self           Med Note  Dorina Hoyer, [facility]   Mon Apr 12, 2020 10:08 AM)    polyethylene glycol (MIRALAX / GLYCOLAX) 17 g packet [id number] No Take 17 g by mouth daily as needed for mild constipation. [provider] Past Week Unknown time Active Self  potassium chloride SA (K-DUR,KLOR-CON) 20 MEQ tablet [id number] No Take 1 tablet (20 mEq total) by mouth 2 (two) times daily. [name], MD 08/13/2020 Unknown time Active Self  QC STOOL SOFTENER PLS LAXATIVE 8.6-50 MG tablet [id number] No Take 1 tablet by mouth at bedtime. [provider] 08/13/2020 Unknown time Active Self           Med Note [name], [name]   Tue Oct 23, 2017  2:56 AM)    rosuvastatin (CRESTOR) 10 MG tablet [id number] No Take 10 mg by mouth every evening. [provider] 08/13/2020 Unknown time Active Self  torsemide (DEMADEX) 20 MG tablet [id number] No TAKE 4 (FOUR) TABLETS BY MOUTH DAILY (2AM+ 2NOON)  Patient taking differently: Take 40 mg by mouth 2 (two) times daily.   [name], MD 08/13/2020 Unknown time Active   traZODone (DESYREL) 50 MG tablet [id number] No Take 100 mg by mouth at bedtime. [provider] 08/12/2020 Unknown time Active Self            Patient Active Problem List   Diagnosis Date Noted   Acute on chronic respiratory failure with hypoxia and hypercapnia ([place]) 04/18/2020   Acute on chronic respiratory failure with hypoxia ([place]) 04/18/2020   Hemoptysis 15/52/0802   Acute metabolic encephalopathy 23/36/1224   Acute encephalopathy 04/18/2020   Chronic diastolic CHF (congestive heart failure) ([place]) 04/18/2020   Sepsis ([place]) 04/18/2020   Morbid obesity with BMI of 50.0-59.9, adult ([place])    Fall at home, initial encounter 06/23/2018   HNP (herniated nucleus pulposus), thoracic 06/23/2018   CHF (congestive heart failure) ([place]) 06/18/2018   Hypertensive disorder 08/28/2017   Obesity 08/28/2017  COPD exacerbation ([place]) 08/04/2017   Acute and chronic respiratory failure with hypercapnia ([place])     Pulmonary HTN ([place])    Genetic testing 02/27/2017   Family history of thyroid cancer    Hypomagnesemia 10/03/2016   Atypical chest pain    HLD (hyperlipidemia) 09/23/2016   Gout 09/23/2016   DVT (deep vein thrombosis) in pregnancy 09/23/2016   Thyroid cancer ([place]) 09/23/2016   Hypocalcemia 09/23/2016   AKI (acute kidney injury) ([place]) 09/23/2016   Acute pulmonary edema ([place])    Acute hypoxemic respiratory failure (HCC)    Ventilator dependent ([place])    Chronic respiratory failure with hypoxia and hypercapnia ([place]) 07/16/2016   CAP (community acquired pneumonia) 01/23/2016   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 01/23/2016   Pulmonary hypertension ([place]) 01/23/2016   Obesity hypoventilation syndrome ([place]) 08/26/2015   Dyslipidemia associated with type 2 diabetes mellitus ([place]) 04/24/2015   Leukocytosis 04/24/2015   Controlled type 2 diabetes mellitus without complication, without long-term current use of insulin ([place]) 04/24/2015   Anxiety 04/24/2015   Benign essential HTN 04/24/2015   Normocytic anemia 04/24/2015   OSA (obstructive sleep apnea) 05/10/2012   Tobacco abuse 07/04/2011    Conditions to be addressed/monitored per PCP order:   aide in home  There are no care plans that you recently modified to display for this patient.   Follow up:  Patient agrees to Care Plan and Follow-up.  Plan: The Managed Medicaid care management team will reach out to the patient again over the next 14 days.  Date/time of next scheduled Social Work care management/care coordination outreach:  10/06/20  Mickel Fuchs, Arita Miss, [place] Medicaid Team  [phone]

## 2020-09-16 NOTE — Patient Instructions (Signed)
Visit Information  Michele Owens was given information about Medicaid Managed Care team care coordination services as a part of their [facility] Medicaid benefit. Michele Owens verbally consented to engagement with the [facility] Managed Care team.   For questions related to your [facility] health plan, please call: [phone] or go here:[email or web address]  If you would like to schedule transportation through your [facility] plan, please call the following number at least 2 days in advance of your appointment: [phone].  Call the [place] at [phone], at any time, 24 hours a day, 7 days a week. If you are in danger or need immediate medical attention call 911.  Michele Owens - following are the goals we discussed in your visit today:   Goals Addressed   None      Social Worker will follow up in 14 days.  Mickel Fuchs, BSW, [place] Managed Medicaid Team  [phone]   Following is a copy of your plan of care:

## 2020-09-17 ENCOUNTER — Other Ambulatory Visit: Payer: Self-pay

## 2020-09-17 ENCOUNTER — Other Ambulatory Visit: Payer: Medicaid Other | Admitting: Obstetrics and Gynecology

## 2020-09-17 DIAGNOSIS — I110 Hypertensive heart disease with heart failure: Secondary | ICD-10-CM | POA: Diagnosis not present

## 2020-09-17 IMAGING — DX DG CHEST 2V
2 series · 2 of 2 positions shown · non-contrast
Comparison: 10/22/2017 chest radiograph.

CLINICAL DATA: Dyspnea

EXAM:
CHEST - 2 VIEW

[chest lat]
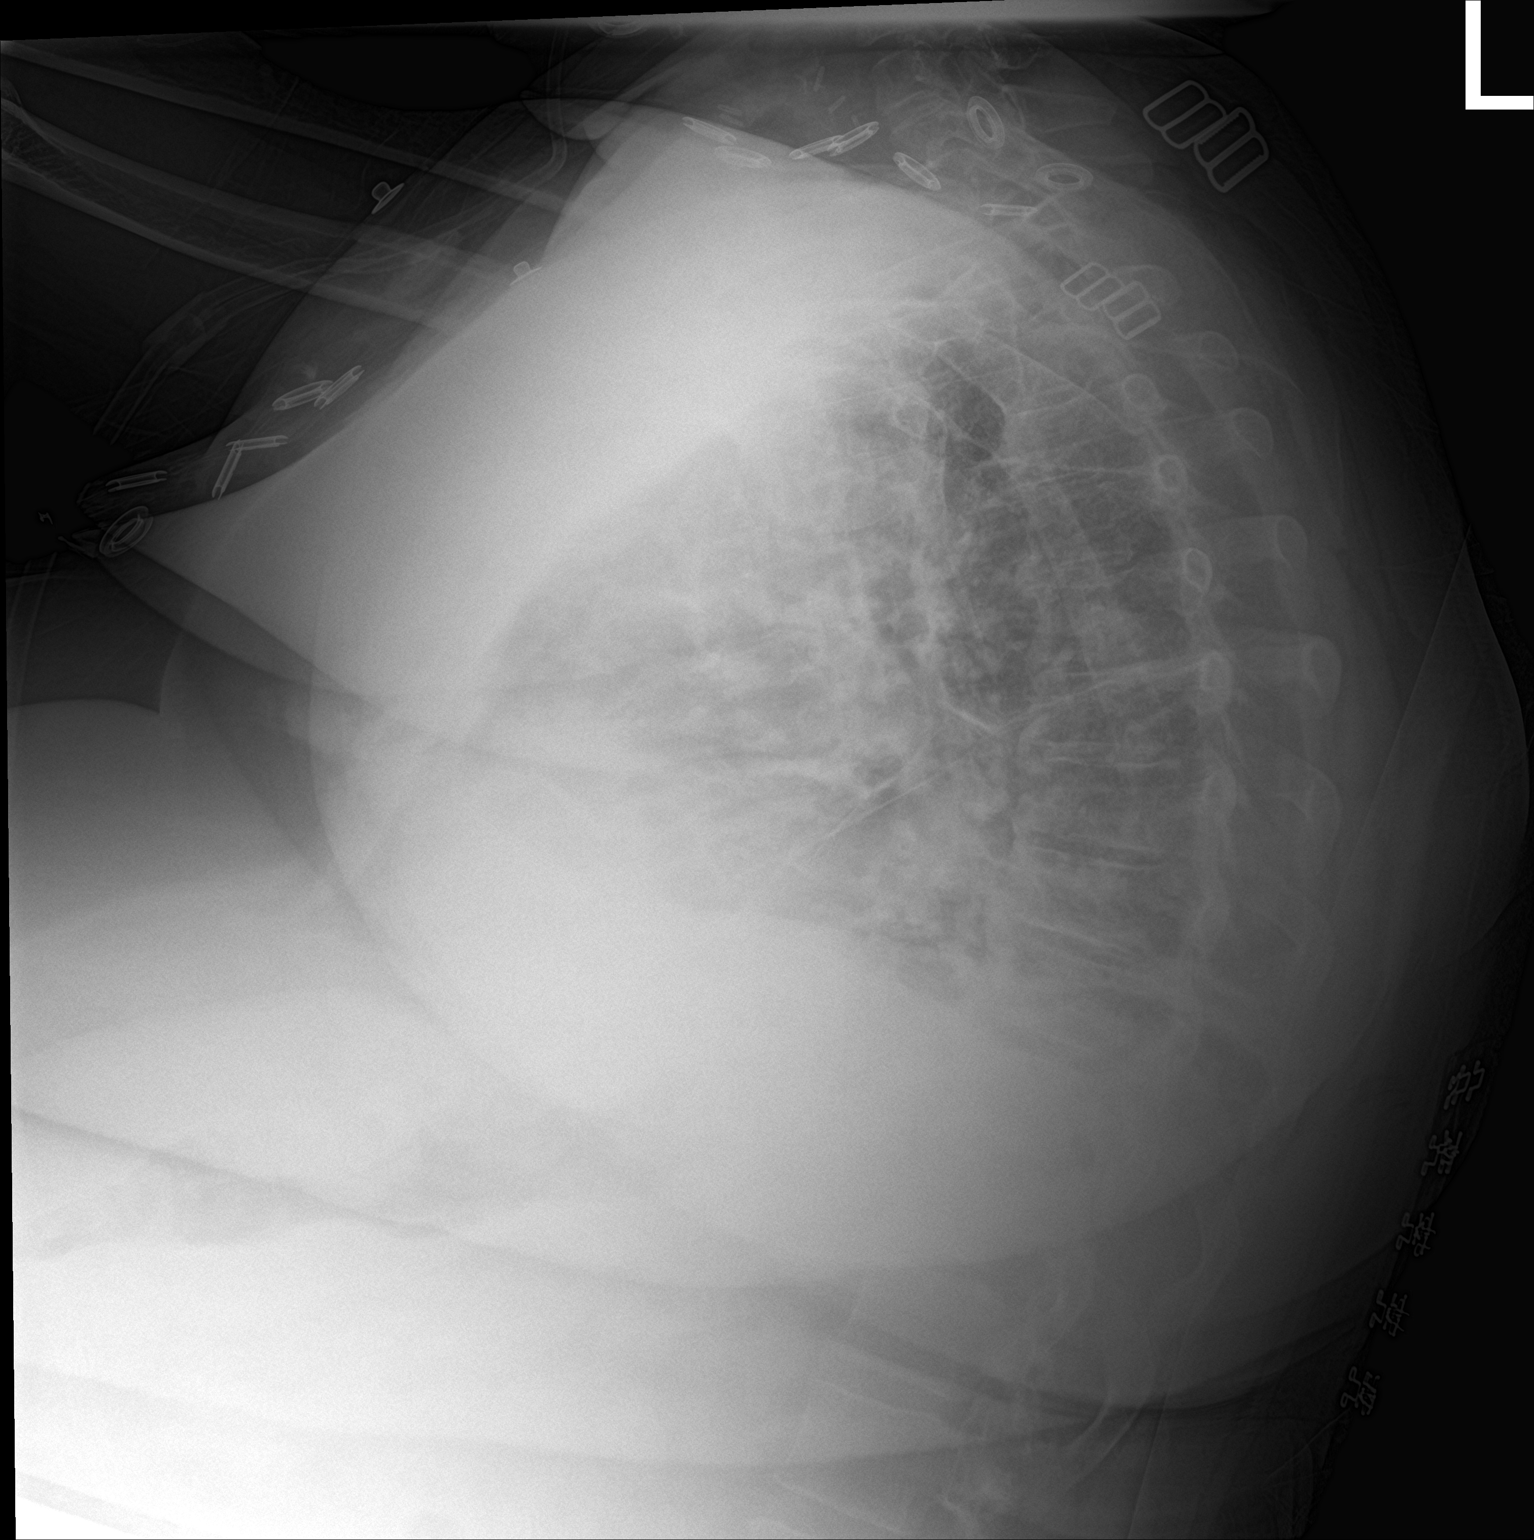

[chest ap]
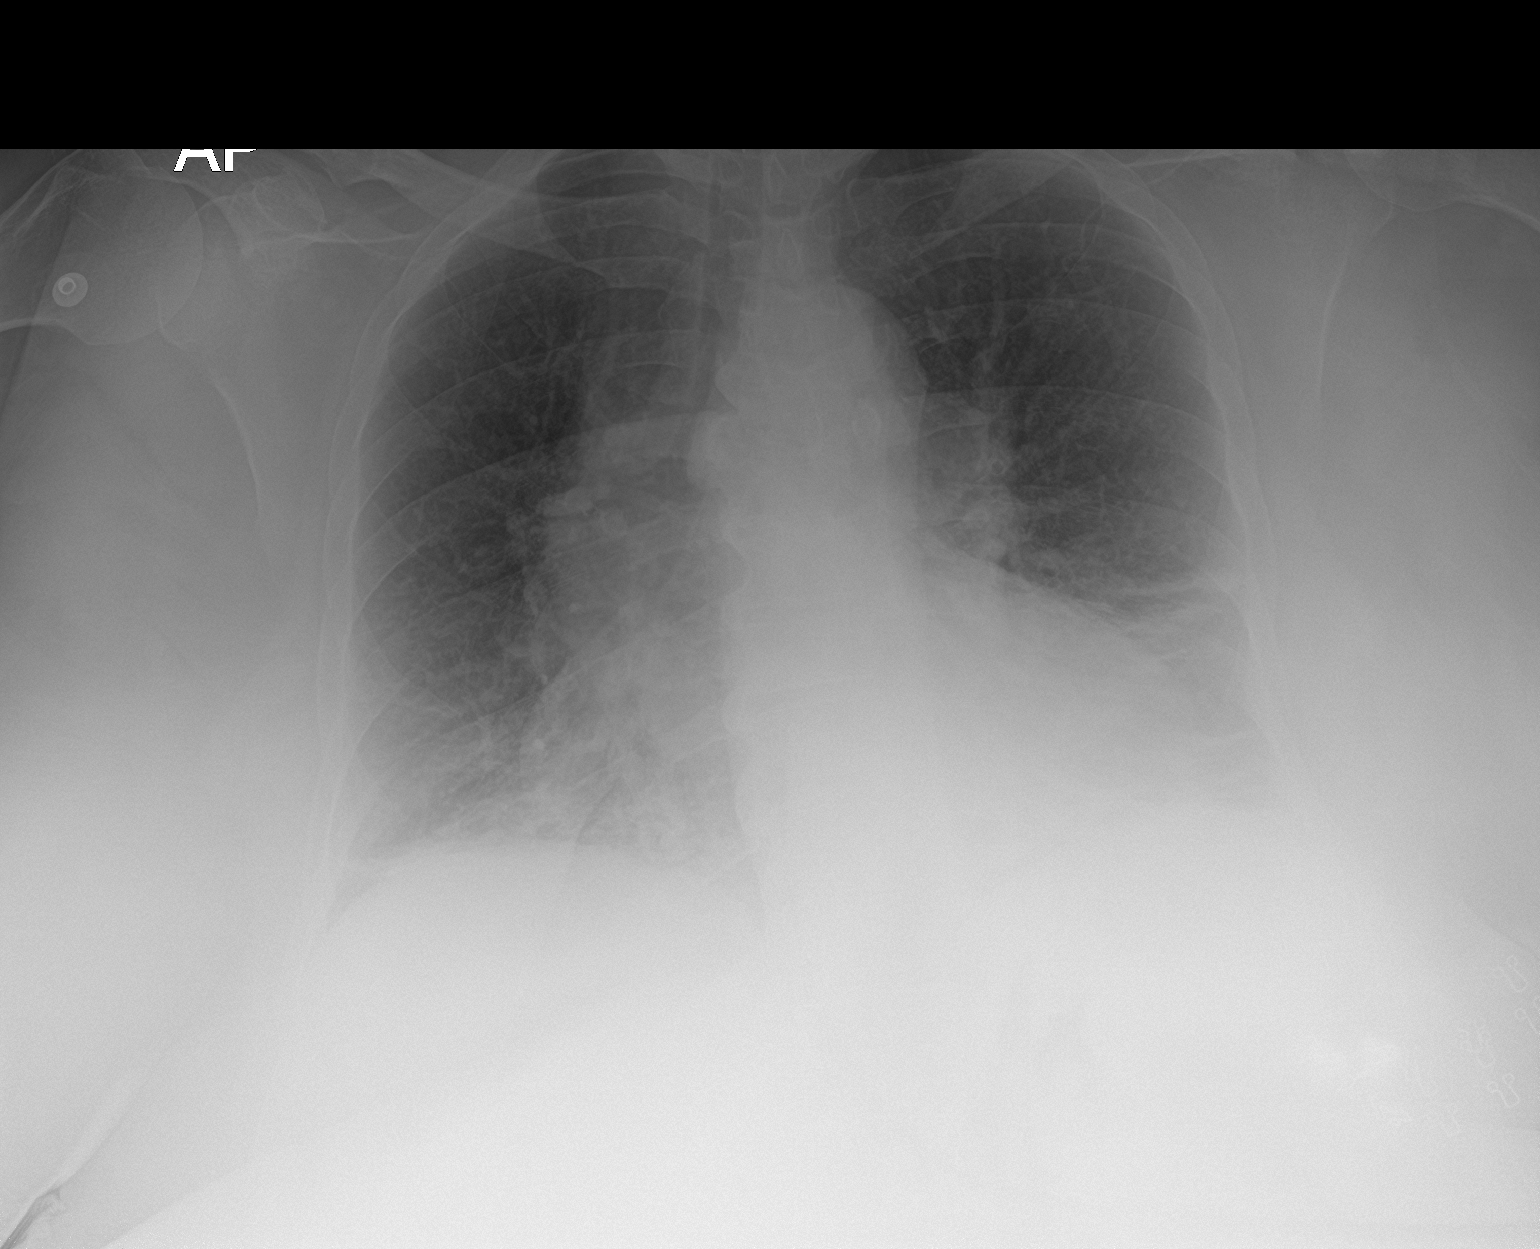

[2 of 2 positions shown; findings below may reference images not displayed]

FINDINGS: Stable cardiomediastinal silhouette with mild cardiomegaly. No
pneumothorax. Possible small left pleural effusion. No right pleural
effusion. Mild pulmonary edema. Hazy bibasilar lung opacities.
IMPRESSION: 1. Mild congestive heart failure.
2. Possible small left pleural effusion .
3. Hazy bibasilar lung opacities, favor atelectasis.

## 2020-09-17 NOTE — Patient Outreach (Signed)
Care Coordination  09/17/2020  Domique Reardon 1960/08/05 [id number]  RNCM returned patient's phone call.  Patient states she has not received a phone call from [facility] regarding home health aide.  RNCM will call and follow up.  [name] RN, BSN [place]  [employer] - Managed Medicaid High Risk [phone].

## 2020-09-19 DIAGNOSIS — I110 Hypertensive heart disease with heart failure: Secondary | ICD-10-CM | POA: Diagnosis not present

## 2020-09-20 ENCOUNTER — Other Ambulatory Visit: Payer: Self-pay

## 2020-09-20 ENCOUNTER — Other Ambulatory Visit: Payer: Self-pay | Admitting: Obstetrics and Gynecology

## 2020-09-20 DIAGNOSIS — I110 Hypertensive heart disease with heart failure: Secondary | ICD-10-CM | POA: Diagnosis not present

## 2020-09-20 NOTE — Patient Outreach (Signed)
Care Coordination  09/20/2020  Michele Owens [birth year] [id number]  RNCM called [place] to confirm patient's appointment day and time.  Transportation arranged for [name] via [place].  RNCM also called and spoke to [place] at [facility] to discuss aide services.  [name] given appointment information and phone number to call to arrange for nurse visit for determination  of aide services needed.  [name] RN, BSN [place]  [employer]ator - Managed Medicaid High Risk [phone].

## 2020-09-21 ENCOUNTER — Other Ambulatory Visit: Payer: Self-pay | Admitting: Obstetrics and Gynecology

## 2020-09-21 ENCOUNTER — Other Ambulatory Visit: Payer: Self-pay

## 2020-09-21 DIAGNOSIS — I110 Hypertensive heart disease with heart failure: Secondary | ICD-10-CM | POA: Diagnosis not present

## 2020-09-21 NOTE — Patient Outreach (Signed)
Care Coordination  09/21/2020  Michele Owens 04-27-1960 [id number]  RNCM returned patient's phone call and answered questions-patient scheduled to have colonoscopy 09/23/20.    [name] RN, BSN [place]  [employer]ator - Managed Medicaid High Risk [phone].

## 2020-09-22 ENCOUNTER — Other Ambulatory Visit: Payer: Self-pay

## 2020-09-22 ENCOUNTER — Other Ambulatory Visit: Payer: Medicaid Other | Admitting: Obstetrics and Gynecology

## 2020-09-22 DIAGNOSIS — I110 Hypertensive heart disease with heart failure: Secondary | ICD-10-CM | POA: Diagnosis not present

## 2020-09-22 IMAGING — MG DIGITAL SCREENING BILATERAL MAMMOGRAM WITH TOMO AND CAD
6 series · 6 of 14 positions shown · non-contrast
Comparison: Previous exam(s).

ACR Breast Density Category a: The breast tissue is almost entirely
fatty.

CLINICAL DATA: Screening.

EXAM:
DIGITAL SCREENING BILATERAL MAMMOGRAM WITH TOMO AND CAD

[R CC synth-2D]
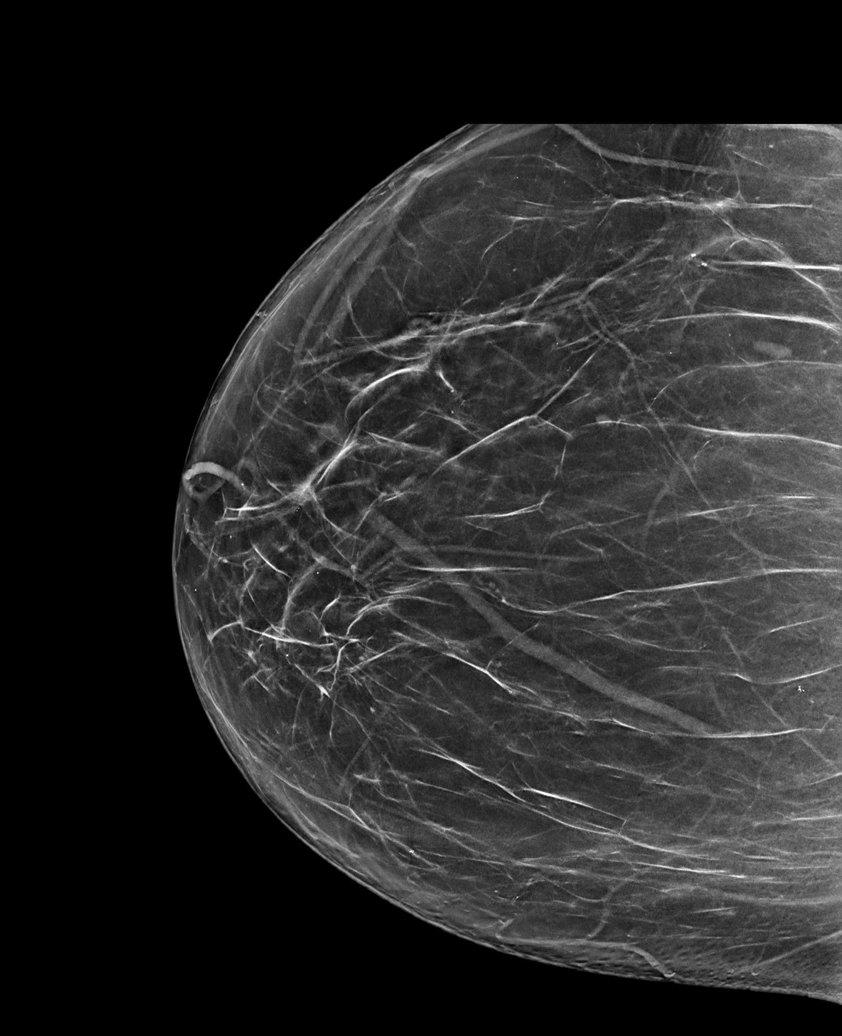

[L MLO synth-2D]
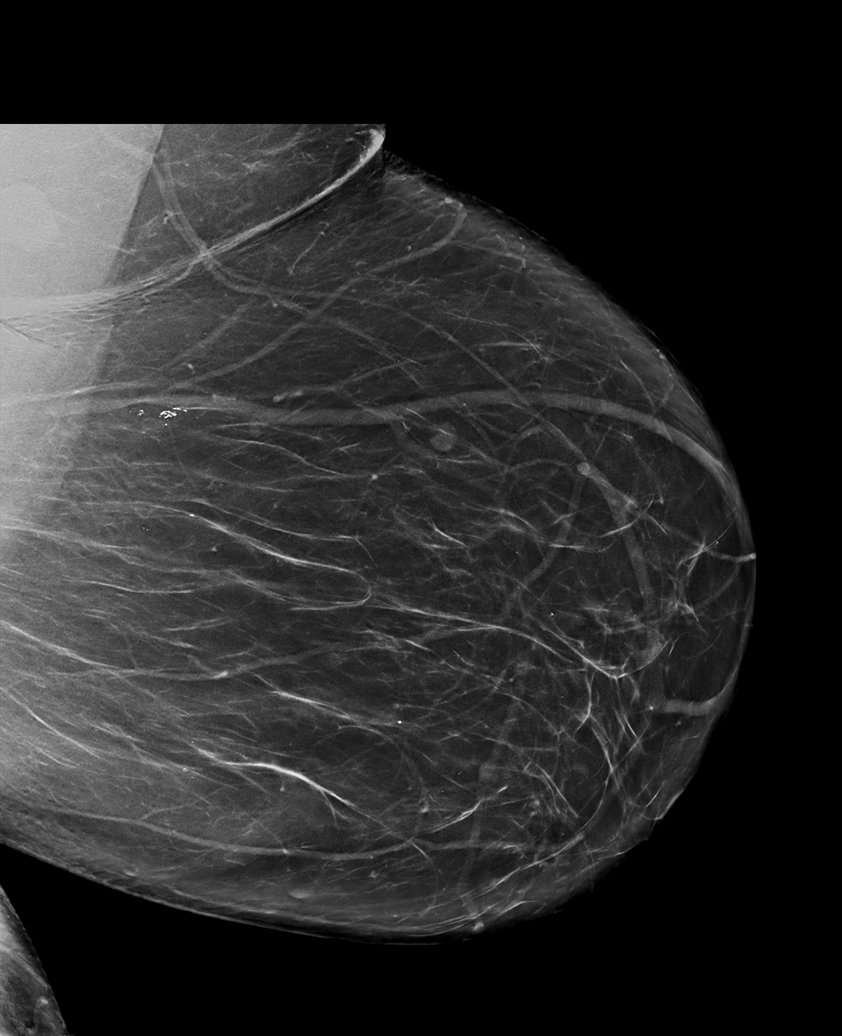

[L CC synth-2D]
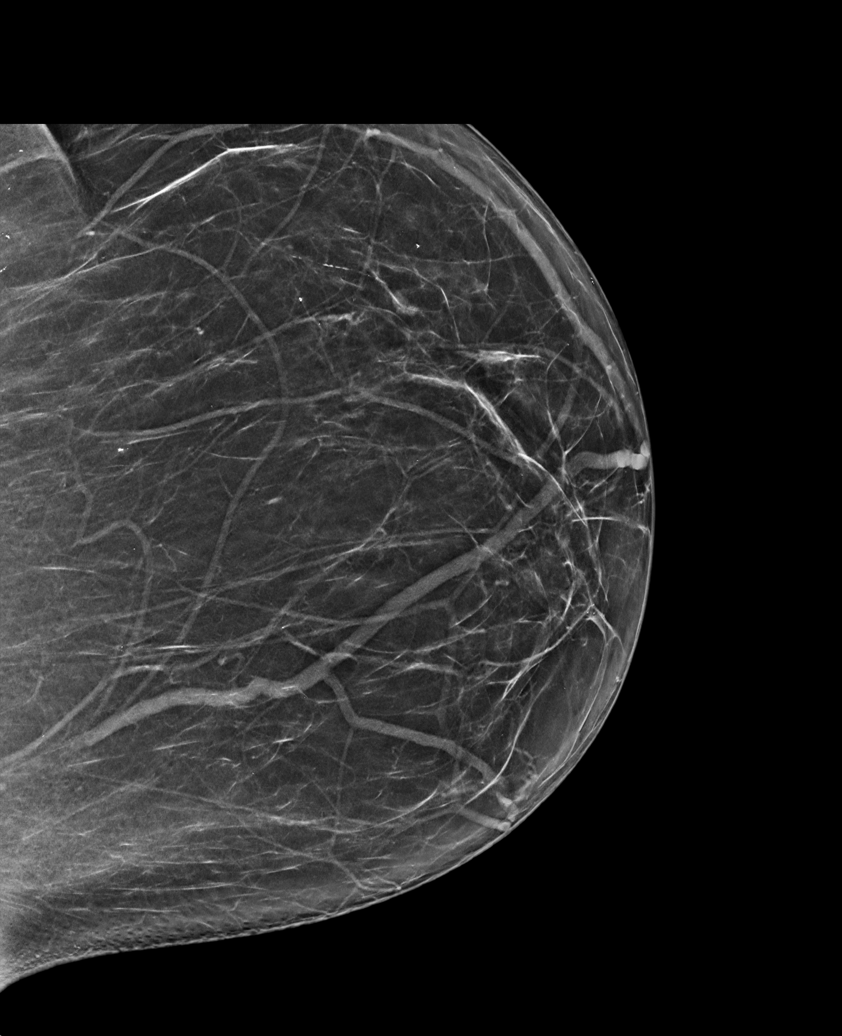

[R MLO synth-2D]
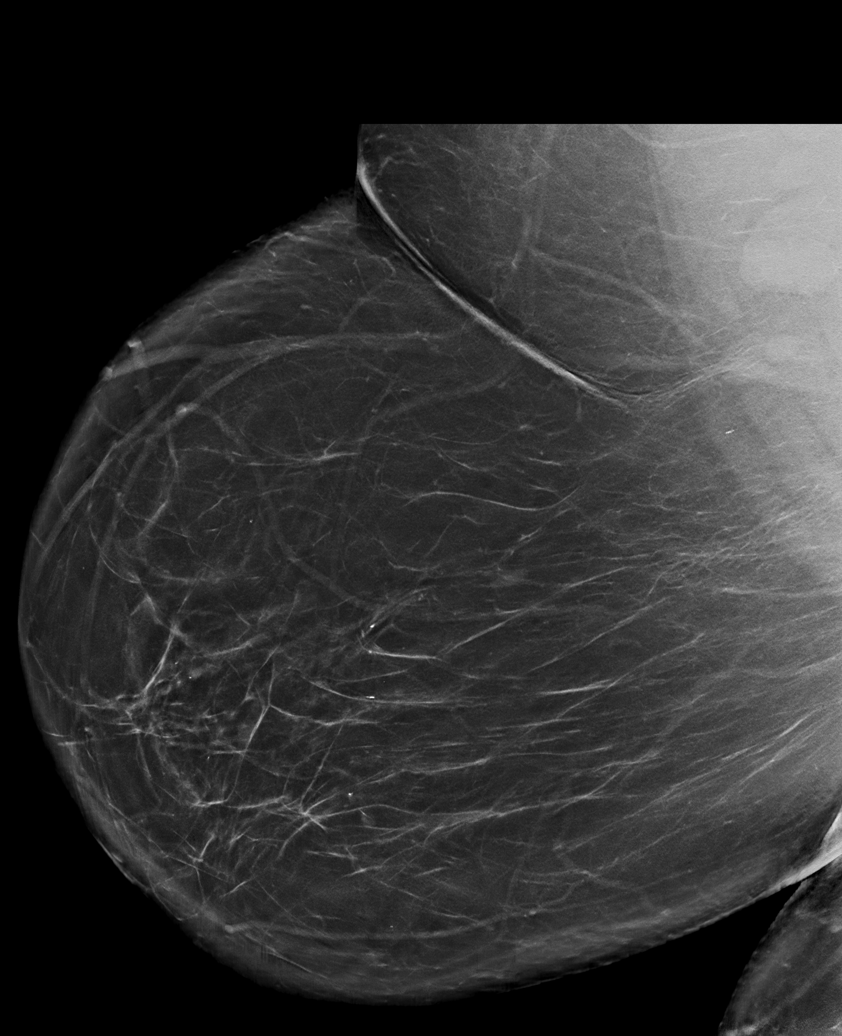

[L CC tomo · tomo slice 38/75.0]
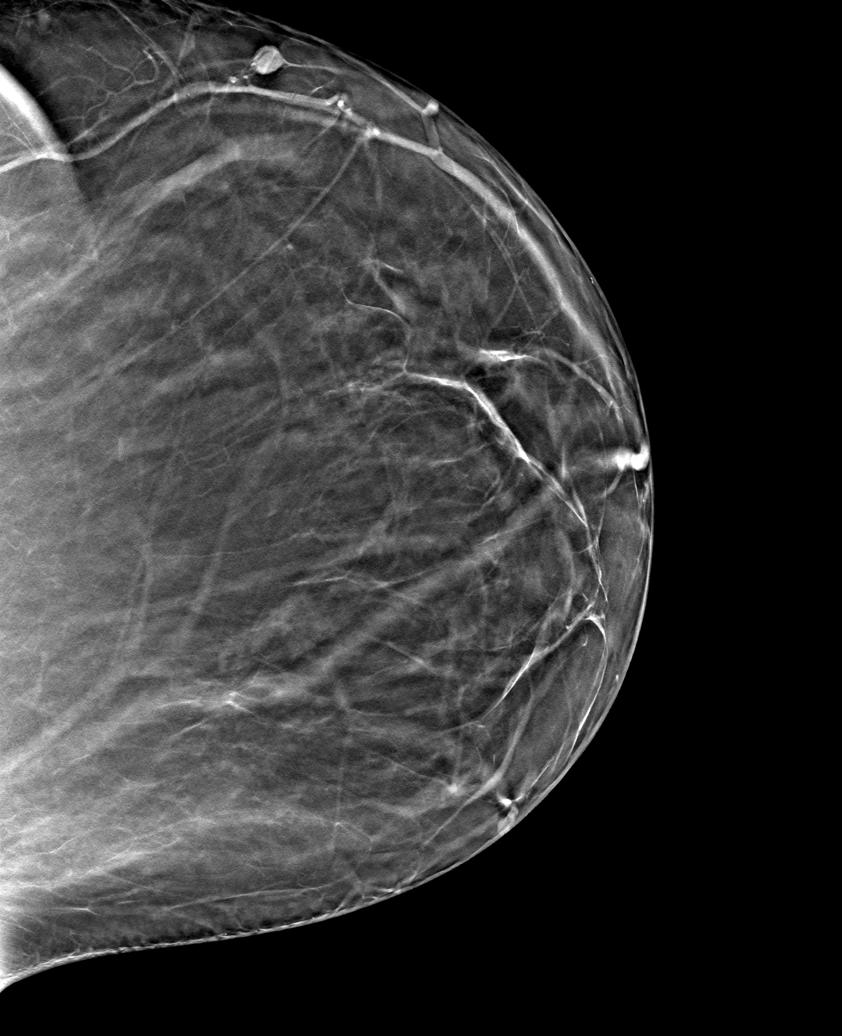

[R CC tomo · tomo slice 43/84.0]
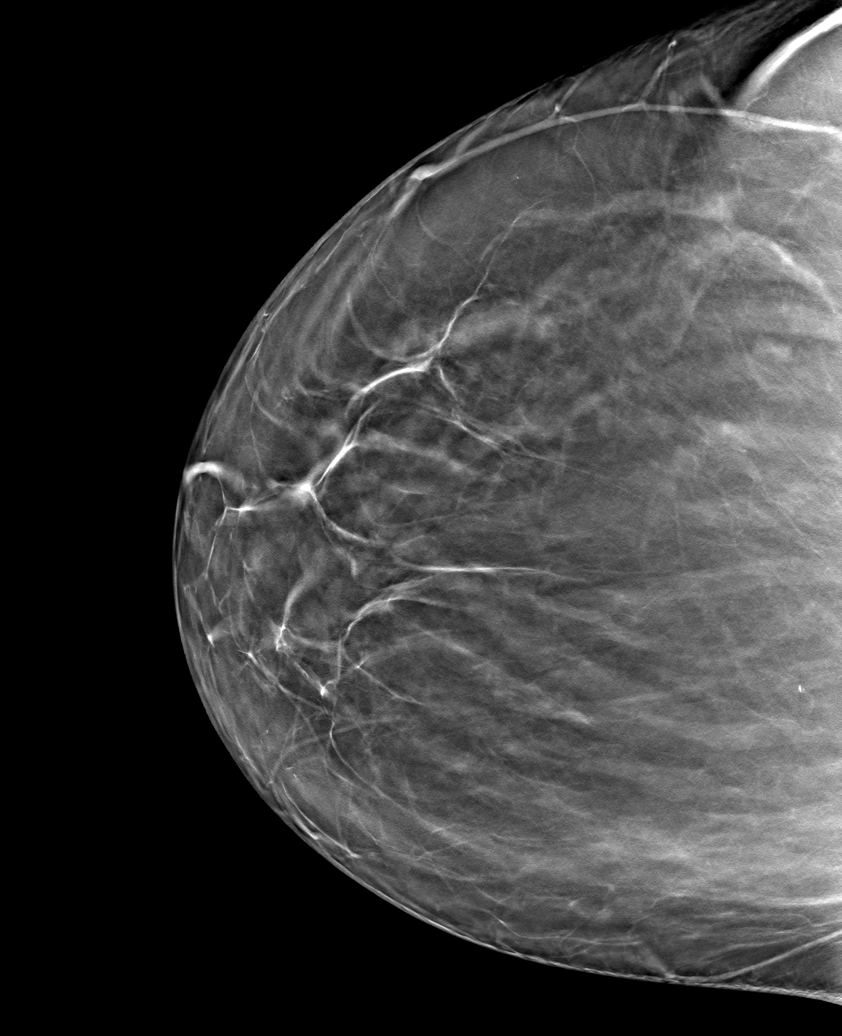

[6 of 14 positions shown; findings below may reference images not displayed]

FINDINGS: There are no findings suspicious for malignancy. Images were
processed with CAD.
IMPRESSION: No mammographic evidence of malignancy. A result letter of this
screening mammogram will be mailed directly to the patient.

RECOMMENDATION:
Screening mammogram in one year. (Code:[id number])

BI-RADS CATEGORY  1: Negative.

## 2020-09-22 NOTE — Patient Outreach (Signed)
Care Coordination  09/22/2020  Michele Owens [birth year] [id number]  RNCM returned patient's phone call.  Patient called to discuss colonoscopy scheduled for tomorrow.  [name] RN, BSN   [employer] - Managed Medicaid High Risk [phone].

## 2020-09-23 DIAGNOSIS — Z9981 Dependence on supplemental oxygen: Secondary | ICD-10-CM | POA: Diagnosis not present

## 2020-09-23 DIAGNOSIS — I110 Hypertensive heart disease with heart failure: Secondary | ICD-10-CM | POA: Diagnosis not present

## 2020-09-23 DIAGNOSIS — R1013 Epigastric pain: Secondary | ICD-10-CM | POA: Diagnosis not present

## 2020-09-23 DIAGNOSIS — K648 Other hemorrhoids: Secondary | ICD-10-CM | POA: Diagnosis not present

## 2020-09-23 DIAGNOSIS — K921 Melena: Secondary | ICD-10-CM | POA: Diagnosis not present

## 2020-09-23 DIAGNOSIS — Z6841 Body Mass Index (BMI) 40.0 and over, adult: Secondary | ICD-10-CM | POA: Diagnosis not present

## 2020-09-23 DIAGNOSIS — E1142 Type 2 diabetes mellitus with diabetic polyneuropathy: Secondary | ICD-10-CM | POA: Diagnosis not present

## 2020-09-23 DIAGNOSIS — K5900 Constipation, unspecified: Secondary | ICD-10-CM | POA: Diagnosis not present

## 2020-09-23 DIAGNOSIS — Z7689 Persons encountering health services in other specified circumstances: Secondary | ICD-10-CM | POA: Diagnosis not present

## 2020-09-23 DIAGNOSIS — D120 Benign neoplasm of cecum: Secondary | ICD-10-CM | POA: Diagnosis not present

## 2020-09-23 DIAGNOSIS — K635 Polyp of colon: Secondary | ICD-10-CM | POA: Diagnosis not present

## 2020-09-23 DIAGNOSIS — D649 Anemia, unspecified: Secondary | ICD-10-CM | POA: Diagnosis not present

## 2020-09-23 DIAGNOSIS — I5042 Chronic combined systolic (congestive) and diastolic (congestive) heart failure: Secondary | ICD-10-CM | POA: Diagnosis not present

## 2020-09-23 DIAGNOSIS — Z8719 Personal history of other diseases of the digestive system: Secondary | ICD-10-CM | POA: Diagnosis not present

## 2020-09-24 ENCOUNTER — Other Ambulatory Visit: Payer: Self-pay

## 2020-09-24 ENCOUNTER — Other Ambulatory Visit: Payer: Medicaid Other | Admitting: Obstetrics and Gynecology

## 2020-09-24 DIAGNOSIS — I110 Hypertensive heart disease with heart failure: Secondary | ICD-10-CM | POA: Diagnosis not present

## 2020-09-24 NOTE — Patient Outreach (Signed)
Care Coordination  09/24/2020  Savahna Casados [birth year] [id number]  RNCM returned patient's phone call.  Patient called to report colonoscopy findings-WNL.  [name] RN, BSN [place]  [employer]ator - Managed Medicaid High Risk [phone].

## 2020-09-26 DIAGNOSIS — I110 Hypertensive heart disease with heart failure: Secondary | ICD-10-CM | POA: Diagnosis not present

## 2020-09-27 ENCOUNTER — Other Ambulatory Visit: Payer: Self-pay

## 2020-09-27 ENCOUNTER — Other Ambulatory Visit: Payer: Self-pay | Admitting: Obstetrics and Gynecology

## 2020-09-27 NOTE — Patient Outreach (Signed)
Care Coordination  09/27/2020  Tenlee Wollin 1960/07/07 [id number]  RNCM returned patient's phone call.  Patient needed phone number for [facility] to inquire about her aide.  Patient also stated she received second letter regarding jury duty.  RNCM will notify SW for any assistance.  [name] RN, BSN [place]  [employer]ator - Managed Medicaid High Risk [phone].    Marland Kitchen

## 2020-09-27 NOTE — Patient Outreach (Signed)
Medicaid Managed Care    Pharmacy Note  09/27/2020 Name: Michele Owens MRN: [id number] DOB: February 18, 1961  Michele Owens is a 60 y.o. year old female who is a primary care patient of [name], MD. The [facility] Managed Care Coordination team was consulted for assistance with disease management and care coordination needs.    Engaged with patient Engaged with patient by telephone for follow up visit in response to referral for case management and/or care coordination services.  Michele Owens was given information about Managed Medicaid Care Coordination team services today. Michele Owens agreed to services and verbal consent obtained.   Objective:  Lab Results  Component Value Date   CREATININE 0.79 08/13/2020   CREATININE 0.67 06/22/2020   CREATININE 0.65 05/24/2020    Lab Results  Component Value Date   HGBA1C 6.2 (H) 04/19/2020       Component Value Date/Time   CHOL 226 (H) 08/05/2017 0318   TRIG 59 08/05/2017 0318   HDL 80 08/05/2017 0318   CHOLHDL 2.8 08/05/2017 0318   VLDL 12 08/05/2017 0318   LDLCALC 134 (H) 08/05/2017 0318    Other: (TSH, CBC, Vit D, etc.)  Clinical ASCVD: No  The ASCVD Risk score [name], et al., 2013) failed to calculate for the following reasons:   Cannot find a previous HDL lab   Cannot find a previous total cholesterol lab    Other: (CHADS2VASc if Afib, PHQ9 if depression, MMRC or CAT for COPD, ACT, DEXA)  BP Readings from Last 3 Encounters:  08/21/20 117/68  08/16/20 (!) 112/56  08/14/20 (!) 141/75    Assessment/Interventions: Review of patient past medical history, allergies, medications, health status, including review of consultants reports, laboratory and other test data, was performed as part of comprehensive evaluation and provision of chronic care management services.    HTN Carvedilol 6.73m BID Lisinopril 519mQD Plan: At goal,  patient stable/ symptoms controlled   Edema Torsemide 2030m2AM and  2Noon) KCl Plan: At goal,  patient stable/ symptoms controlled   DM Lab Results  Component Value Date   HGBA1C 6.2 (H) 04/19/2020   HGBA1C 6.5 (H) 04/11/2020   HGBA1C 6.6 (H) 07/09/2018   Lab Results  Component Value Date   LDLCALC 134 (H) 08/05/2017   CREATININE 0.79 08/13/2020    Lab Results  Component Value Date   NA 143 08/13/2020   K 4.0 08/13/2020   CREATININE 0.79 08/13/2020   GFRNONAA >60 08/13/2020   GFRAA >60 09/30/2019   GLUCOSE 92 08/13/2020    Metformin 500m28mBID May 2022: Patient has been having issues with testing, unable to use new monitor. Coordinated with [name]), and OffiGlass blower/designerPCP'BlueLinxice who did a home visit to show patient how to use test strips this week. Called patient today and she is having no issues testing now. This was the main thing we went over today June 2022: Sugar readings are 105 111 125 100 Plan: At goal,  patient stable/ symptoms controlled    Lipids Lab Results  Component Value Date   CHOL 226 (H) 08/05/2017   CHOL 167 09/23/2016   Lab Results  Component Value Date   HDL 80 08/05/2017   HDL 39 (L) 09/23/2016   Lab Results  Component Value Date   LDLCALC 134 (H) 08/05/2017   LDLCALC 93 09/23/2016   Lab Results  Component Value Date   TRIG 59 08/05/2017   TRIG 176 (H) 09/23/2016   TRIG 388 (H)  07/22/2016   Lab Results  Component Value Date   CHOLHDL 2.8 08/05/2017   CHOLHDL 4.3 09/23/2016   No results found for: LDLDIRECT  Rosuvastatin 38m Plan: Needs repeat labs but patient isn't enthused about scheduling F/U   Pain Left Foot:  Pain Score:  May 2022: 8/17 September 2020: 9/10 Oxycodone 10/325 Cyclobenzaprine March 2022: Missed PT appointment and now can't go back (Per patient). June 2022: Still hasn't re-scheduled PT  Insomnia Trazodone 1083m-patient not sleeping due to stress of her son passing June 2022: Patient stated isn't sleeping because there are people outside  her house making noise   Gout Allopurinol 10026mStates has gout attack daily but then described OA pain and how her bones rub against each other in her knees and hips March 2022: Ask for Uric Acid Labs April 2022: No labs in yet  Thyroid Levothyroxine 112m45mQD April 2022: Patient hasn't had meds in 3 weeks. Called Pharmacy and they will deliver today May 2022: Patient is compliant and taking meds as directed Plan: At goal,  patient stable/ symptoms controlled   SDOH (Social Determinants of Health) assessments and interventions performed:    Care Plan  Allergies  Allergen Reactions   Bee Venom Swelling and Other (See Comments)    "All over my body" (swelling)   Fioricet [Butalbital-Apap-Caffeine] Nausea And Vomiting and Rash   Ibuprofen Rash and Other (See Comments)    Severe rash   Lamisil [Terbinafine] Rash and Other (See Comments)    Pt states this causes her to "feel funny"   Nsaids Other (See Comments)    Per MD's orders     Medications Reviewed Today     Reviewed by CrafGayla Medicus (Registered Nurse) on 09/27/20 at 1151  Med List Status: <None>   Medication Order Taking? Sig Documenting Provider Last Dose Status Informant  albuterol (PROVENTIL HFA;VENTOLIN HFA) 108 (90 Base) MCG/ACT inhaler [id number] 1-2 puffs into the lungs every 6 (six) hours as needed for wheezing or shortness of breath. LongMargette Fast 08/13/2020 Unknown time Active Self  allopurinol (ZYLOPRIM) 100 MG tablet [id number] 100 mg by mouth daily. [provider] 08/13/2020 Unknown time Active Self           Med Note (SMITamala JulianFFREY W   Tue Oct 23, 2017  2:58 AM)    ANORJearl KlinefelterIPTA 62.5-25 MCG/INH AEPB [id number] 1 puff into the lungs daily.  [provider] 08/13/2020 Unknown time Active Self  benzonatate (TESSALON) 200 MG capsule [id number] 1 capsule (200 mg total) by mouth in the morning, at Owens, and at bedtime. 7 DS Regalado, Belkys A, MD 08/13/2020  Unknown time Active Self  Blood Glucose Monitoring Suppl (ACCU-CHEK AVIVA PLUS) w/Device KIT [id number] each by Other route daily.  [provider] unk unk Active Self  butalbital-acetaminophen-caffeine (FIORICET) 50-325-40 MG tablet [id number] 1 tablet by mouth 2 (two) times daily as needed for migraine. [provider] Past Month Unknown time Active Self  calcitRIOL (ROCALTROL) 0.5 MCG capsule [id number] 1 capsule (0.5 mcg total) by mouth daily. HongModena Jansky 08/13/2020 Unknown time Active Self           Med Note (SMITamala JulianFFREY W   Tue Oct 23, 2017  2:59 AM)    carvedilol (COREG) 6.25 MG tablet [id number] 1 tablet (6.25 mg total) by mouth 2 (two) times daily with a meal. Bensimhon, DaniShaune Pascal  08/13/2020 3 am Active Self  cefdinir (OMNICEF) 300 MG capsule [id number] No TAKE 1 CAPSULE (300 MG TOTAL) BY MOUTH TWO TIMES DAILY FOR 4 DAYS.  Patient not taking: No sig reported   [name], MD Not Taking Unknown time Active Self  clonazePAM (KLONOPIN) 2 MG tablet [id number] No Take 1 tablet (2 mg total) by mouth 3 (three) times daily. for anxiety  Patient taking differently: Take 2 mg by mouth 3 (three) times daily.   [name], MD 08/13/2020 Unknown time Active Self  cyclobenzaprine (FLEXERIL) 10 MG tablet [id number] No Take 10 mg by mouth 3 (three) times daily. [provider] 08/13/2020 Unknown time Active Self  diclofenac sodium (VOLTAREN) 1 % GEL [id number] No Apply 2 g topically 4 (four) times daily as needed (for pain).  Patient not taking: No sig reported   [name], MD Not Taking Unknown time Active Self  EPINEPHrine 0.3 mg/0.3 mL IJ SOAJ injection [id number] No Inject 0.3 mg into the muscle daily as needed for anaphylaxis. [provider] unk last dose Active Self  FEROSUL 325 (65 Fe) MG tablet [id number] No Take 325 mg by mouth daily. [provider] 08/13/2020 Unknown time Active Self           Med Note  [name], [name]   Tue Oct 23, 2017  2:57 AM)    glucose blood test strip [id number] No Use as instructed to check blood sugar up to 4 times daily  Patient taking differently: 1 each by Other route See admin instructions. Use as instructed to check blood sugar up to 4 times daily   [name], DO unk unk Active Self  guaiFENesin (MUCINEX) 600 MG 12 hr tablet [id number] No Take 2 tablets (1,200 mg total) by mouth 2 (two) times daily. Regalado, Belkys A, MD 08/13/2020 Unknown time Active Self  ipratropium-albuterol (DUONEB) 0.5-2.5 (3) MG/3ML SOLN [id number] No Take 3 mLs by nebulization 4 (four) times daily.  [provider] 08/12/2020 Unknown time Active Self           Med Note [name], [name]   Tue Oct 23, 2017  2:57 AM)    levothyroxine (SYNTHROID, LEVOTHROID) 112 MCG tablet [id number] No Take 224 mcg by mouth daily. [provider] 08/13/2020 Unknown time Active Self           Med Note [name], [name]   Tue Oct 23, 2017  2:57 AM)    lisinopril (PRINIVIL,ZESTRIL) 5 MG tablet [id number] No Take 1 tablet (5 mg total) by mouth daily. Please call for office visit [phone]  Patient taking differently: Take 5 mg by mouth daily.   [name], MD 08/13/2020 Unknown time Active Self           Med Note [name], [name]   Tue Oct 23, 2017  2:58 AM)    loperamide (IMODIUM) 2 MG capsule [id number] No Take 1 capsule (2 mg total) by mouth 4 (four) times daily as needed for diarrhea or loose stools. [name], MD Past Month Unknown time Active Self  metFORMIN (GLUCOPHAGE) 500 MG tablet [id number] No Take 1 tablet (500 mg total) by mouth 2 (two) times daily with a meal. [name], MD 08/13/2020 Unknown time Active Self           Med Note [name], [name]   Tue Oct 23, 2017  2:58 AM)    nitroGLYCERIN (NITROSTAT) 0.4 MG SL tablet [id number] No Place 1 tablet (0.4 mg total) under the  tongue every 5 (five) minutes as needed for chest pain. [name], MD unk last dose Active  Self  nystatin cream (MYCOSTATIN) [id number] No Apply 1 application topically 2 (two) times daily as needed for dry skin. Regalado, Belkys A, MD Past Week Unknown time Active Self  omeprazole (PRILOSEC) 20 MG capsule [id number] No Take 20 mg by mouth 2 (two) times daily before a meal. [provider] 08/13/2020 Unknown time Active Self  oxyCODONE-acetaminophen (PERCOCET) 10-325 MG tablet [id number] No Take 1 tablet by mouth every 6 (six) hours as needed for pain. [provider] 08/13/2020 Unknown time Active Self           Med Note Broadus John, [facility]   Fri Aug 13, 2020  9:48 PM)    OXYGEN [id number] No Inhale 2-3 L into the lungs continuous. [provider] 08/13/2020 Unknown time Active Self           Med Note Dorina Hoyer, [facility]   Mon Apr 12, 2020 10:08 AM)    polyethylene glycol (MIRALAX / GLYCOLAX) 17 g packet [id number] No Take 17 g by mouth daily as needed for mild constipation. [provider] Past Week Unknown time Active Self  potassium chloride SA (K-DUR,KLOR-CON) 20 MEQ tablet [id number] No Take 1 tablet (20 mEq total) by mouth 2 (two) times daily. [name], MD 08/13/2020 Unknown time Active Self  QC STOOL SOFTENER PLS LAXATIVE 8.6-50 MG tablet [id number] No Take 1 tablet by mouth at bedtime. [provider] 08/13/2020 Unknown time Active Self           Med Note [name], [name]   Tue Oct 23, 2017  2:56 AM)    rosuvastatin (CRESTOR) 10 MG tablet [id number] No Take 10 mg by mouth every evening. [provider] 08/13/2020 Unknown time Active Self  torsemide (DEMADEX) 20 MG tablet [id number] No TAKE 4 (FOUR) TABLETS BY MOUTH DAILY (2AM+ 2NOON)  Patient taking differently: Take 40 mg by mouth 2 (two) times daily.   [name], MD 08/13/2020 Unknown time Active   traZODone (DESYREL) 50 MG tablet [id number] No Take 100 mg by mouth at bedtime. [provider] 08/12/2020 Unknown time Active Self            Patient Active Problem List    Diagnosis Date Noted   Acute on chronic respiratory failure with hypoxia and hypercapnia ([place]) 04/18/2020   Acute on chronic respiratory failure with hypoxia ([place]) 04/18/2020   Hemoptysis 65/46/5035   Acute metabolic encephalopathy 46/56/8127   Acute encephalopathy 04/18/2020   Chronic diastolic CHF (congestive heart failure) ([place]) 04/18/2020   Sepsis ([place]) 04/18/2020   Morbid obesity with BMI of 50.0-59.9, adult ([place])    Fall at home, initial encounter 06/23/2018   HNP (herniated nucleus pulposus), thoracic 06/23/2018   CHF (congestive heart failure) ([place]) 06/18/2018   Hypertensive disorder 08/28/2017   Obesity 08/28/2017   COPD exacerbation ([place]) 08/04/2017   Acute and chronic respiratory failure with hypercapnia (H[facility])    Pulmonary HTN ([place])    Genetic testing 02/27/2017   Family history of thyroid cancer    Hypomagnesemia 10/03/2016   Atypical chest pain    HLD (hyperlipidemia) 09/23/2016   Gout 09/23/2016   DVT (deep vein thrombosis) in pregnancy 09/23/2016   Thyroid cancer ([place]) 09/23/2016   Hypocalcemia 09/23/2016   AKI (acute kidney injury) ([place]) 09/23/2016   Acute pulmonary edema ([place])    Acute hypoxemic respiratory failure ([place])    Ventilator dependent ([place])  Chronic respiratory failure with hypoxia and hypercapnia (HCC) 07/16/2016   CAP (community acquired pneumonia) 01/23/2016   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 01/23/2016   Pulmonary hypertension ([place]) 01/23/2016   Obesity hypoventilation syndrome ([place]) 08/26/2015   Dyslipidemia associated with type 2 diabetes mellitus ([place]) 04/24/2015   Leukocytosis 04/24/2015   Controlled type 2 diabetes mellitus without complication, without long-term current use of insulin ([place]) 04/24/2015   Anxiety 04/24/2015   Benign essential HTN 04/24/2015   Normocytic anemia 04/24/2015   OSA (obstructive sleep apnea) 05/10/2012   Tobacco abuse 07/04/2011    Conditions to be addressed/monitored: HTN and DM  Care  Plan : Medication Management  Updates made by Lane Hacker, [place] since 06/28/2020 12:00 AM     Problem: Health Promotion or Disease Self-Management (General Plan of Care)      Goal: Medication Management   Note:   Current Barriers:  Does not adhere to prescribed medication regimen Does not maintain contact with provider office Does not contact provider office for questions/concerns   Pharmacist Clinical Goal(s):  Over the next 30 days, patient will achieve adherence to monitoring guidelines and medication adherence to achieve therapeutic efficacy contact provider office for questions/concerns as evidenced notation of same in electronic health record through collaboration with PharmD and provider.    Interventions: Inter-disciplinary care team collaboration (see longitudinal plan of care) Comprehensive medication review performed; medication list updated in electronic medical record  _0 @ _1 @ _2 @  Patient Goals/Self-Care Activities Over the next 30 days, patient will:  - take medications as prescribed collaborate with provider on medication access solutions  Follow Up Plan: The care management team will reach out to the patient again over the next 30 days.      Task: Mutually Develop and Royce Macadamia Achievement of Patient Goals   Note:   Care Management Activities:    - verbalization of feelings encouraged    Notes:      Medication Assistance: None required. Patient affirms current coverage meets needs.   Follow up: Agree   Plan: The care management team will reach out to the patient again over the next 30 days.   [place] Constable, Pharm.D., Managed Medicaid Pharmacist - [phone]

## 2020-09-27 NOTE — Patient Instructions (Signed)
Visit Information  Ms. Screws was given information about Medicaid Managed Care team care coordination services as a part of their [facility] Medicaid benefit. [name] verbally consented to engagement with the [facility] Managed Care team.   For questions related to your [facility] health plan, please call: [phone] or go here:[email or web address]  If you would like to schedule transportation through your [facility] plan, please call the following number at least 2 days in advance of your appointment: [phone].  Call the [place] at [phone], at any time, 24 hours a day, 7 days a week. If you are in danger or need immediate medical attention call 911.  Ms. Woodin - following are the goals we discussed in your visit today:   Goals Addressed   None     Please see education materials related to DM provided as print materials.   Patient verbalizes understanding of instructions provided today.   The Managed Medicaid care management team will reach out to the patient again over the next 45 days.   Arizona Constable, Pharm.D., Managed Medicaid Pharmacist [phone]   Following is a copy of your plan of care:  Patient Care Plan: General Plan of Care (Adult)     Problem Identified: Health Promotion or Disease Self-Management (General Plan of Care)   Priority: Medium  Onset Date: 04/23/2020     Problem Identified: Barriers to Treatment   Priority: High  Onset Date: 04/23/2020     Long-Range Goal: Barriers to Treatment Identified and Managed   Start Date: 05/03/2020  Expected End Date: 11/30/2020  Recent Progress: Not on track  Priority: High  Note:   CARE PLAN ENTRY Medicaid Managed Care (see longitudinal plan of care for additional care plan information)  Current Barriers:  Transportation barriers-patient utilizing [facility] and [employer] as needed. Limited access to caregiver-patient needs PCS, currently  not receiving services she is expecting.   Nurse Clinical Goal(s):  Over the next 30 days, patient will work with [employer] to address needs related to transportation. Update 05/12/20:  Patient has used [employer] and is very pleased. Over the next 90 days, patient will attend all scheduled medical appointments. RNCM will make referral to SW for assistance with PCS Update 05/20/20:  Patient states she is now receiving PCS from [facility] 3 days a week for 4 hours each day. Update 05/27/20: BSW spoke with patient about PCS, she stated someone usually comes in 3 days a week for a few hours. She stated her aide was off today. BSW contacted [place] and spoke with [facility], she stated that patient has 13 hours per week with 6 days a week, however patient chooses to have an aide come out 3 days a week so that she can have more hours. The aide assist with most ADL's. Update 06/04/20: Patient stated she is ready to move to [facility]. She has already started the process but wants to save some money first and she has to wait on her nephew who will drive the uhaul truck. Update 06/24/20: BSW contacted patient to follow up on her moving to [facility], she stated she is still saving money. Patient did cut call short because she was sleeping, patient stated she was not in need of any resources at this time. Update 08/27/20: Bsw received information from [facility] about patient needing some batteries for her remote. BSW contacted patient to see if any resources were needed. Patient stated she contacted [employer] and  they informed her it would cost 40 dollars to replace her remote, patient states she only needs batteries. BSW asked if patient had anyone to call that could bring her some batteries, patient stated no. She states her daughter is mad at her right now and working so she does not talk to her, patient states she has not family to call. BSW asked if her aide could  help, patient states she has not had an aide in about a week, she had to let her last aide go because she smelled like marijuana and they have not replaced her. BSW contacted patients healthcare agency [facility] at [phone] and left a voicemail.  Interventions: Inter-disciplinary care team collaboration (see longitudinal plan of care) Referred patient to community resources care guide team for assistance with transportation. Provided patient with information about [facility] and [employer]. Discussed plans with patient for ongoing care management follow up and provided patient with direct contact information for care management team Advised patient to call [place] or [employer]. Collaborated with [employer] and [place] regarding transportation. Assisted patient/caregiver with obtaining information about health plan benefits Collaborated with Social Work regarding PCS. Update 08/30/20:  Patient continues to have issues with PCS-SW is investigating. Update 06/14/20:  Patient continues to utilize [employer], aide, [employer] as needed-has missed appointments due to transportation "no shows"-will continue to follow. Update 08/30/20:  patient has been successful in attending appointments with [facility] and [facility] transportation.  Patient Self Care Activities:  Patient will call [place]. Update 06/07/20:  Patient continues to utilize [facility] with success. Update 06/14/20:  Patient has missed appointments due to transportation no shows.  [facility] transportation has been utilized as needed.       Task: Alleviate Barriers to Mental Health Treatment   Note:   Care Management Activities:    - willingness to receive help encouraged    Notes:     Patient Care Plan: General Plan of Care (Adult)     Problem Identified: Health Promotion or Disease Self-Management (General Plan of Care)    Priority: High  Onset Date: 04/23/2020     Long-Range Goal: Self-Management Plan Developed   Start Date: 04/23/2020  Expected End Date: 11/30/2020  Recent Progress: Not on track  Priority: Medium  Note:   Current Barriers:  Chronic Disease Management support and education needs.  Patient with abdominal wall infection-seen at [facility] ED 05/24/20 - I&D performed. Update 06/01/20:  Patient states she is improving-HHRN is cleaning and dressing infection. Update 06/07/20:  Infection continues to improve per patient, HHRN continues to clean and dress infection site. Update 06/17/20:  Infection improved, healed.  Nurse Case Manager Clinical Goal(s):  Over the next 30 days, patient will attend all scheduled medical appointments:  Over the next 30 days, patient will work with CM team pharmacist to review medications. Update 05/20/20:  Patient has appointment with Pharmacist 05/21/20. Update 05/25/20:  Patient declines to speak with Pharmacist at this time. Update 08/30/20:  Patient is working with Pharmacist.  Interventions:  Inter-disciplinary care team collaboration (see longitudinal plan of care) Evaluation of current treatment plan and patient's adherence to plan as established by provider. Reviewed medications with patient. Collaborated with pharmacist regarding medications. Discussed plans with patient for ongoing care management follow up and provided patient with direct contact information for care management team Reviewed scheduled/upcoming provider appointments.  Update 05/20/20:  Patient unable to attend PT appointment 05/19/20, has next appointment 05/26/20.  Has Cardiology appointment  05/28/20.  Reviewed [place] information with patient again and with aide. Update 06/07/20:  Patient has PT appointment 06/09/20. Update 06/17/20:  patient attended PT appointment 06/16/20 and also attended appointment at [facility] this week-patient states she feels better. Pharmacy referral for  medication review. Update 05/24/20: Pharmacist has attempted to speak with patient and has been unable to.  Patient at Urgent Care today with stomach pain.  RNCM will reschedule appointment with patient and speak with her regarding importance of speaking with Pharmacist. Update 05/25/20: Patient declines to speak with Pharmacist at this time. Update 08/30/20:  Patient is working with Pharmacist for medication management.  Patient Goals/Self-Care Activities Over the next 30 days, patient will:  -Attends all scheduled provider appointments Calls pharmacy for medication refills Calls provider office for new concerns or questions  Follow Up Plan: The Managed Medicaid care management team will reach out to the patient again over the next 30 days.  The patient has been provided with contact information for the Managed Medicaid care management team and has been advised to call with any health related questions or concerns.        Task: Mutually Develop and Royce Macadamia Achievement of Patient Goals   Note:   Care Management Activities:    - verbalization of feelings encouraged    Notes:     Patient Care Plan: Medication Management     Problem Identified: Health Promotion or Disease Self-Management (General Plan of Care)      Goal: Medication Management   Note:   Current Barriers:  Does not adhere to prescribed medication regimen Does not maintain contact with provider office Does not contact provider office for questions/concerns   Pharmacist Clinical Goal(s):  Over the next 30 days, patient will achieve adherence to monitoring guidelines and medication adherence to achieve therapeutic efficacy contact provider office for questions/concerns as evidenced notation of same in electronic health record through collaboration with PharmD and provider.    Interventions: Inter-disciplinary care team collaboration (see longitudinal plan of care) Comprehensive medication review performed;  medication list updated in electronic medical record  @RXCPDIABETES @ @RXCPHYPERTENSION @ @RXCPHYPERLIPIDEMIA @  Patient Goals/Self-Care Activities Over the next 30 days, patient will:  - take medications as prescribed collaborate with provider on medication access solutions  Follow Up Plan: The care management team will reach out to the patient again over the next 30 days.      Task: Mutually Develop and Royce Macadamia Achievement of Patient Goals   Note:   Care Management Activities:    - verbalization of feelings encouraged    Notes:

## 2020-09-29 ENCOUNTER — Other Ambulatory Visit: Payer: Self-pay | Admitting: Obstetrics and Gynecology

## 2020-09-29 ENCOUNTER — Other Ambulatory Visit: Payer: Self-pay

## 2020-09-29 NOTE — Patient Outreach (Signed)
Care Coordination  09/29/2020  Michele Owens 08/23/1960 [id number]  RNCM returned patient's phone call.  Patient wanted to update me on aide status.  RNCM will call [place] to follow up as well.  [name] RN, BSN [place]  [facility] Curator - Managed Medicaid High Risk [phone].

## 2020-09-30 ENCOUNTER — Other Ambulatory Visit: Payer: Self-pay

## 2020-09-30 ENCOUNTER — Other Ambulatory Visit: Payer: Self-pay | Admitting: Obstetrics and Gynecology

## 2020-09-30 NOTE — Patient Instructions (Signed)
Visit Information  Ms. Brazeau was given information about Medicaid Managed Care team care coordination services as a part of their [facility] Medicaid benefit. [name] verbally consented to engagement with the [facility] Managed Care team.   For questions related to your [facility] health plan, please call: [phone] or go here:[email or web address]  If you would like to schedule transportation through your [facility] plan, please call the following number at least 2 days in advance of your appointment: [phone].  Call the [place] at [phone], at any time, 24 hours a day, 7 days a week. If you are in danger or need immediate medical attention call 911.  Ms. Juste - following are the goals we discussed in your visit today:   Goals Addressed             This Visit's Progress    Protect My Health       Timeframe:  Long-Range Goal Priority:  Medium Start Date:    04/23/20                         Expected End Date: ongoing           Follow Up Date:  10/30/20   - schedule appointment for vaccines needed due to my age or health - schedule recommended health tests (blood work, mammogram, colonoscopy, pap test) - schedule and keep appointment for annual check-up  Patient has PT appointment 05/19/20. Update 05/20/20:  Patient unable to attend PT appointment on 05/19/20.  Has next PT appointment 05/26/20.  Cardiology appointment 05/28/20. Update 05/24/20:  Attempted to reach patient today-she is at Urgent Care with stomach pain.  Will reschedule. Update 05/25/20:  Patient seen at Urgent Care and [place] 05/24/20.  Patient with abscess- given antibiotics. Update 06/01/20:  Patient continues to improve, HHRN cleaning and changing dressing. Update 06/07/20:  Patient states abscess continues to improve.  Has PT on Wednesday, 06/09/20. Update 06/14/20:  Abscess healed.  Patient missed PT appointment as transportation did not pick her up-appointment  rescheduled to 06/16/20. Update 06/17/20:  Patient attended PT appointment yesterday and also attended appointment at [place] this week-Patient states she feels better. Update 08/24/20:  Patient has appointment at [facility] 08/25/20 at 0930. Update 08/30/20:  Patient without complaints today, awaiting therapy services to start. Update 09/30/20:  Patient without complaint today, friend of family recently died.  Patient has upcoming appointment with Pharmacist and SW.     Patient verbalizes understanding of instructions provided today.   The Managed Medicaid care management team will reach out to the patient again over the next 30 days.  The patient has been provided with contact information for the Managed Medicaid care management team and has been advised to call with any health related questions or concerns.   [name] RN, BSN [place] Management Coordinator - Managed Medicaid High Risk [phone]    Following is a copy of your plan of care:  Patient Care Plan: General Plan of Care (Adult)     Problem Identified: Health Promotion or Disease Self-Management (General Plan of Care)   Priority: Medium  Onset Date: 04/23/2020     Problem Identified: Barriers to Treatment   Priority: High  Onset Date: 04/23/2020     Long-Range Goal: Barriers to Treatment Identified and Managed   Start Date: 05/03/2020  Expected End Date: 11/30/2020  This Visit's Progress: On track  Recent Progress: Not on track  Priority: Medium  Note:   CARE PLAN ENTRY Medicaid Managed Care (see longitudinal plan of care for additional care plan information)  Current Barriers:  Transportation barriers-patient utilizing [facility] and [employer] as needed. Limited access to caregiver-patient needs PCS, currently not receiving services she is expecting. Patient choosing not to use aides provided to her-discussed finding another agency with  patient.  Nurse Clinical Goal(s):  Over the next 30 days, patient will work with [employer] to address needs related to transportation. Update 05/12/20:  Patient has used [employer] and is very pleased. Over the next 90 days, patient will attend all scheduled medical appointments. RNCM will make referral to SW for assistance with PCS Update 05/20/20:  Patient states she is now receiving PCS from [facility] 3 days a week for 4 hours each day. Update 05/27/20: BSW spoke with patient about PCS, she stated someone usually comes in 3 days a week for a few hours. She stated her aide was off today. BSW contacted [place] and spoke with [facility], she stated that patient has 13 hours per week with 6 days a week, however patient chooses to have an aide come out 3 days a week so that she can have more hours. The aide assist with most ADL's. Update 06/04/20: Patient stated she is ready to move to [facility]. She has already started the process but wants to save some money first and she has to wait on her nephew who will drive the uhaul truck. Update 06/24/20: BSW contacted patient to follow up on her moving to [facility], she stated she is still saving money. Patient did cut call short because she was sleeping, patient stated she was not in need of any resources at this time. Update 08/27/20: Bsw received information from [facility] about patient needing some batteries for her remote. BSW contacted patient to see if any resources were needed. Patient stated she contacted [employer] and they informed her it would cost 40 dollars to replace her remote, patient states she only needs batteries. BSW asked if patient had anyone to call that could bring her some batteries, patient stated no. She states her daughter is mad at her right now and working so she does not talk to her, patient states she has not family to call. BSW asked if her aide could help, patient states she has not  had an aide in about a week, she had to let her last aide go because she smelled like marijuana and they have not replaced her. BSW contacted patients healthcare agency [facility] at [phone] and left a voicemail. Update 09/30/20:  Patient has aides available to her at [facility] but chooses not to use them. Discussed with patient about her finding a new agency?  Interventions: Inter-disciplinary care team collaboration (see longitudinal plan of care) Referred patient to community resources care guide team for assistance with transportation. Provided patient with information about [facility] and [employer]. Discussed plans with patient for ongoing care management follow up and provided patient with direct contact information for care management team Advised patient to call [place] or [employer]. Collaborated with [employer] and [place] regarding transportation. Assisted patient/caregiver with obtaining information about health plan benefits Collaborated with Social Work regarding PCS. Update 08/30/20:  Patient continues to have issues with PCS-SW is investigating. Update 06/14/20:  Patient continues to utilize [employer], aide, [employer] as needed-has missed appointments due to transportation "no shows"-will continue to  follow. Update 08/30/20:  patient has been successful in attending appointments with [facility] and [facility] transportation. Update 09/30/20:  Patient has upcoming appointments with MM Pharmacist and SW. Patient Self Care Activities:  Patient will call [place]. Update 06/07/20:  Patient continues to utilize [facility] with success. Update 06/14/20:  Patient has missed appointments due to transportation no shows.  [facility] transportation has been utilized as needed. Update 09/30/20:  Patient with no transportation problems right now-uses [employer] or [place] as needed  for appointments with success.    Patient Care Plan: General Plan of Care (Adult)     Problem Identified: Health Promotion or Disease Self-Management (General Plan of Care)   Priority: High  Onset Date: 04/23/2020     Long-Range Goal: Self-Management Plan Developed   Start Date: 04/23/2020  Expected End Date: 11/30/2020  This Visit's Progress: On track  Recent Progress: Not on track  Priority: Medium  Note:   Current Barriers:  Chronic Disease Management support and education needs.  Patient with abdominal wall infection-seen at [facility] ED 05/24/20 - I&D performed. Update 06/01/20:  Patient states she is improving-HHRN is cleaning and dressing infection. Update 06/07/20:  Infection continues to improve per patient, HHRN continues to clean and dress infection site. Update 06/17/20:  Infection improved, healed.  Nurse Case Manager Clinical Goal(s):  Over the next 30 days, patient will attend all scheduled medical appointments:  Over the next 30 days, patient will work with CM team pharmacist to review medications. Update 05/20/20:  Patient has appointment with Pharmacist 05/21/20. Update 05/25/20:  Patient declines to speak with Pharmacist at this time. Update 08/30/20:  Patient is working with Pharmacist.  Interventions:  Inter-disciplinary care team collaboration (see longitudinal plan of care) Evaluation of current treatment plan and patient's adherence to plan as established by provider. Reviewed medications with patient. Collaborated with pharmacist regarding medications. Discussed plans with patient for ongoing care management follow up and provided patient with direct contact information for care management team Reviewed scheduled/upcoming provider appointments.  Update 05/20/20:  Patient unable to attend PT appointment 05/19/20, has next appointment 05/26/20.  Has Cardiology appointment 05/28/20.  Reviewed [place] information with patient again and with aide. Update 06/07/20:   Patient has PT appointment 06/09/20. Update 06/17/20:  patient attended PT appointment 06/16/20 and also attended appointment at [facility] this week-patient states she feels better. Pharmacy referral for medication review. Update 05/24/20: Pharmacist has attempted to speak with patient and has been unable to.  Patient at Urgent Care today with stomach pain.  RNCM will reschedule appointment with patient and speak with her regarding importance of speaking with Pharmacist. Update 05/25/20: Patient declines to speak with Pharmacist at this time. Update 08/30/20:  Patient is working with Pharmacist for medication management. Update 09/30/20:  Patient without complaint today, has upcoming appointments with MM Pharmacist and SW.  Patient Goals/Self-Care Activities Over the next 30 days, patient will:  -Attends all scheduled provider appointments Calls pharmacy for medication refills Calls provider office for new concerns or questions  Follow Up Plan: The Managed Medicaid care management team will reach out to the patient again over the next 30 days.  The patient has been provided with contact information for the Managed Medicaid care management team and has been advised to call with any health related questions or concerns.

## 2020-09-30 NOTE — Patient Outreach (Signed)
Medicaid Managed Care   Nurse Care Manager Note  09/30/2020 Name:  Michele Owens MRN:  [id number] DOB:  February 23, 1961  Michele Owens is an 61 y.o. year old female who is a primary patient of [name], MD.  The [facility] Managed Care Coordination team was consulted for assistance with:    Chronic healthcare management needs.  Michele Owens was given information about Medicaid Managed Care Coordination team services today. [name] agreed to services and verbal consent obtained.  Engaged with patient by telephone for follow up visit in response to provider referral for case management and/or care coordination services.   Assessments/Interventions:  Review of past medical history, allergies, medications, health status, including review of consultants reports, laboratory and other test data, was performed as part of comprehensive evaluation and provision of chronic care management services.  SDOH (Social Determinants of Health) assessments and interventions performed:   Care Plan  Allergies  Allergen Reactions   Bee Venom Swelling and Other (See Comments)    "All over my body" (swelling)   Fioricet [Butalbital-Apap-Caffeine] Nausea And Vomiting and Rash   Ibuprofen Rash and Other (See Comments)    Severe rash   Lamisil [Terbinafine] Rash and Other (See Comments)    Pt states this causes her to "feel funny"   Nsaids Other (See Comments)    Per MD's orders     Medications Reviewed Today     Reviewed by [name], RN (Registered Nurse) on 09/30/20 at [phone]  Med List Status: <None>   Medication Order Taking? Sig Documenting Provider Last Dose Status Informant  albuterol (PROVENTIL HFA;VENTOLIN HFA) 108 (90 Base) MCG/ACT inhaler [id number] No Inhale 1-2 puffs into the lungs every 6 (six) hours as needed for wheezing or shortness of breath. Margette Fast, MD 08/13/2020 Unknown time Active Self  allopurinol (ZYLOPRIM) 100 MG tablet [id number] No Take 100 mg by mouth daily.  [provider] 08/13/2020 Unknown time Active Self           Med Note [name], [name]   Tue Oct 23, 2017  2:58 AM)    [name] ELLIPTA 62.5-25 MCG/INH AEPB [id number] No Inhale 1 puff into the lungs daily.  [provider] 08/13/2020 Unknown time Active Self  benzonatate (TESSALON) 200 MG capsule [id number] No Take 1 capsule (200 mg total) by mouth in the morning, at noon, and at bedtime. 7 DS Regalado, Belkys A, MD 08/13/2020 Unknown time Active Self  Blood Glucose Monitoring Suppl (ACCU-CHEK AVIVA PLUS) w/Device KIT [id number] No 1 each by Other route daily.  [provider] unk unk Active Self  butalbital-acetaminophen-caffeine (FIORICET) 50-325-40 MG tablet [id number] No Take 1 tablet by mouth 2 (two) times daily as needed for migraine. [provider] Past Month Unknown time Active Self  calcitRIOL (ROCALTROL) 0.5 MCG capsule [id number] No Take 1 capsule (0.5 mcg total) by mouth daily. Modena Jansky, MD 08/13/2020 Unknown time Active Self           Med Note [name], [name]   Tue Oct 23, 2017  2:59 AM)    carvedilol (COREG) 6.25 MG tablet [id number] No Take 1 tablet (6.25 mg total) by mouth 2 (two) times daily with a meal. Bensimhon, [name], MD 08/13/2020 3 am Active Self  cefdinir (OMNICEF) 300 MG capsule [id number] No TAKE 1 CAPSULE (300 MG TOTAL) BY MOUTH TWO TIMES DAILY FOR 4 DAYS.  Patient not taking: No sig reported   [name], MD Not Taking Unknown time Active Self  clonazePAM (KLONOPIN) 2 MG tablet [id number] No Take 1 tablet (2 mg total) by mouth 3 (three) times daily. for anxiety  Patient taking differently: Take 2 mg by mouth 3 (three) times daily.   [name], MD 08/13/2020 Unknown time Active Self  cyclobenzaprine (FLEXERIL) 10 MG tablet [id number] No Take 10 mg by mouth 3 (three) times daily. [provider] 08/13/2020 Unknown time Active Self  diclofenac sodium (VOLTAREN) 1 % GEL [id number] No Apply 2 g topically 4 (four) times daily as  needed (for pain).  Patient not taking: No sig reported   [name], MD Not Taking Unknown time Active Self  EPINEPHrine 0.3 mg/0.3 mL IJ SOAJ injection [id number] No Inject 0.3 mg into the muscle daily as needed for anaphylaxis. [provider] unk last dose Active Self  FEROSUL 325 (65 Fe) MG tablet [id number] No Take 325 mg by mouth daily. [provider] 08/13/2020 Unknown time Active Self           Med Note [name], [name]   Tue Oct 23, 2017  2:57 AM)    glucose blood test strip [id number] No Use as instructed to check blood sugar up to 4 times daily  Patient taking differently: 1 each by Other route See admin instructions. Use as instructed to check blood sugar up to 4 times daily   [name], DO unk unk Active Self  guaiFENesin (MUCINEX) 600 MG 12 hr tablet [id number] No Take 2 tablets (1,200 mg total) by mouth 2 (two) times daily. Regalado, Belkys A, MD 08/13/2020 Unknown time Active Self  ipratropium-albuterol (DUONEB) 0.5-2.5 (3) MG/3ML SOLN [id number] No Take 3 mLs by nebulization 4 (four) times daily.  [provider] 08/12/2020 Unknown time Active Self           Med Note [name], [name]   Tue Oct 23, 2017  2:57 AM)    levothyroxine (SYNTHROID, LEVOTHROID) 112 MCG tablet [id number] No Take 224 mcg by mouth daily. [provider] 08/13/2020 Unknown time Active Self           Med Note [name], [name]   Tue Oct 23, 2017  2:57 AM)    lisinopril (PRINIVIL,ZESTRIL) 5 MG tablet [id number] No Take 1 tablet (5 mg total) by mouth daily. Please call for office visit [phone]  Patient taking differently: Take 5 mg by mouth daily.   [name], MD 08/13/2020 Unknown time Active Self           Med Note [name], [name]   Tue Oct 23, 2017  2:58 AM)    loperamide (IMODIUM) 2 MG capsule [id number] No Take 1 capsule (2 mg total) by mouth 4 (four) times daily as needed for diarrhea or loose stools. [name], MD Past Month Unknown time Active Self   metFORMIN (GLUCOPHAGE) 500 MG tablet [id number] No Take 1 tablet (500 mg total) by mouth 2 (two) times daily with a meal. [name], MD 08/13/2020 Unknown time Active Self           Med Note [name], [name]   Tue Oct 23, 2017  2:58 AM)    nitroGLYCERIN (NITROSTAT) 0.4 MG SL tablet [id number] No Place 1 tablet (0.4 mg total) under the tongue every 5 (five) minutes as needed for chest pain. [name], MD unk last dose Active Self  nystatin cream (MYCOSTATIN) [id number] No Apply 1 application topically 2 (two) times daily as needed for dry skin. Regalado, Cassie Freer, MD Past Week Unknown  time Active Self  omeprazole (PRILOSEC) 20 MG capsule [id number] No Take 20 mg by mouth 2 (two) times daily before a meal. [provider] 08/13/2020 Unknown time Active Self  oxyCODONE-acetaminophen (PERCOCET) 10-325 MG tablet [id number] No Take 1 tablet by mouth every 6 (six) hours as needed for pain. [provider] 08/13/2020 Unknown time Active Self           Med Note Broadus John, [facility]   Fri Aug 13, 2020  9:48 PM)    OXYGEN [id number] No Inhale 2-3 L into the lungs continuous. [provider] 08/13/2020 Unknown time Active Self           Med Note Dorina Hoyer, [facility]   Mon Apr 12, 2020 10:08 AM)    polyethylene glycol (MIRALAX / GLYCOLAX) 17 g packet [id number] No Take 17 g by mouth daily as needed for mild constipation. [provider] Past Week Unknown time Active Self  potassium chloride SA (K-DUR,KLOR-CON) 20 MEQ tablet [id number] No Take 1 tablet (20 mEq total) by mouth 2 (two) times daily. [name], MD 08/13/2020 Unknown time Active Self  QC STOOL SOFTENER PLS LAXATIVE 8.6-50 MG tablet [id number] No Take 1 tablet by mouth at bedtime. [provider] 08/13/2020 Unknown time Active Self           Med Note [name], [name]   Tue Oct 23, 2017  2:56 AM)    rosuvastatin (CRESTOR) 10 MG tablet [id number] No Take 10 mg by mouth every evening. [provider]  08/13/2020 Unknown time Active Self  torsemide (DEMADEX) 20 MG tablet [id number] No TAKE 4 (FOUR) TABLETS BY MOUTH DAILY (2AM+ 2NOON)  Patient taking differently: Take 40 mg by mouth 2 (two) times daily.   [name], MD 08/13/2020 Unknown time Active   traZODone (DESYREL) 50 MG tablet [id number] No Take 100 mg by mouth at bedtime. [provider] 08/12/2020 Unknown time Active Self            Patient Active Problem List   Diagnosis Date Noted   Acute on chronic respiratory failure with hypoxia and hypercapnia ([place]) 04/18/2020   Acute on chronic respiratory failure with hypoxia ([place]) 04/18/2020   Hemoptysis 73/41/9379   Acute metabolic encephalopathy 02/40/9735   Acute encephalopathy 04/18/2020   Chronic diastolic CHF (congestive heart failure) ([place]) 04/18/2020   Sepsis ([place]) 04/18/2020   Morbid obesity with BMI of 50.0-59.9, adult ([place])    Fall at home, initial encounter 06/23/2018   HNP (herniated nucleus pulposus), thoracic 06/23/2018   CHF (congestive heart failure) ([place]) 06/18/2018   Hypertensive disorder 08/28/2017   Obesity 08/28/2017   COPD exacerbation ([place]) 08/04/2017   Acute and chronic respiratory failure with hypercapnia (HCC)    Pulmonary HTN ([place])    Genetic testing 02/27/2017   Family history of thyroid cancer    Hypomagnesemia 10/03/2016   Atypical chest pain    HLD (hyperlipidemia) 09/23/2016   Gout 09/23/2016   DVT (deep vein thrombosis) in pregnancy 09/23/2016   Thyroid cancer ([place]) 09/23/2016   Hypocalcemia 09/23/2016   AKI (acute kidney injury) ([place]) 09/23/2016   Acute pulmonary edema (HCC)    Acute hypoxemic respiratory failure (HCC)    Ventilator dependent ([place])    Chronic respiratory failure with hypoxia and hypercapnia ([place]) 07/16/2016   CAP (community acquired pneumonia) 01/23/2016   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 01/23/2016   Pulmonary hypertension ([name]) 01/23/2016   Obesity hypoventilation syndrome ([place])  08/26/2015   Dyslipidemia associated with type  2 diabetes mellitus ([place]) 04/24/2015   Leukocytosis 04/24/2015   Controlled type 2 diabetes mellitus without complication, without long-term current use of insulin ([place]) 04/24/2015   Anxiety 04/24/2015   Benign essential HTN 04/24/2015   Normocytic anemia 04/24/2015   OSA (obstructive sleep apnea) 05/10/2012   Tobacco abuse 07/04/2011    Conditions to be addressed/monitored per PCP order:   chronic healthcare management needs, OSA, DM2, anxiety, HLD  Care Plan : General Plan of Care (Adult)  Updates made by [name], RN since 09/30/2020 12:00 AM     Problem: Barriers to Treatment   Priority: High  Onset Date: 04/23/2020     Long-Range Goal: Barriers to Treatment Identified and Managed   Start Date: 05/03/2020  Expected End Date: 11/30/2020  This Visit's Progress: On track  Recent Progress: Not on track  Priority: Medium  Note:   CARE PLAN ENTRY Medicaid Managed Care (see longitudinal plan of care for additional care plan information)  Current Barriers:  Transportation barriers-patient utilizing [facility] and [employer] as needed. Limited access to caregiver-patient needs PCS, currently not receiving services she is expecting. Patient choosing not to use aides provided to her-discussed finding another agency with patient.  Nurse Clinical Goal(s):  Over the next 30 days, patient will work with [employer] to address needs related to transportation. Update 05/12/20:  Patient has used [employer] and is very pleased. Over the next 90 days, patient will attend all scheduled medical appointments. RNCM will make referral to SW for assistance with PCS Update 05/20/20:  Patient states she is now receiving PCS from [facility] 3 days a week for 4 hours each day. Update 05/27/20: BSW spoke with patient about PCS, she stated someone usually comes in 3 days a week for a few hours. She stated her  aide was off today. BSW contacted [place] and spoke with [facility], she stated that patient has 13 hours per week with 6 days a week, however patient chooses to have an aide come out 3 days a week so that she can have more hours. The aide assist with most ADL's. Update 06/04/20: Patient stated she is ready to move to [facility]. She has already started the process but wants to save some money first and she has to wait on her nephew who will drive the uhaul truck. Update 06/24/20: BSW contacted patient to follow up on her moving to [facility], she stated she is still saving money. Patient did cut call short because she was sleeping, patient stated she was not in need of any resources at this time. Update 08/27/20: Bsw received information from [facility] about patient needing some batteries for her remote. BSW contacted patient to see if any resources were needed. Patient stated she contacted [employer] and they informed her it would cost 40 dollars to replace her remote, patient states she only needs batteries. BSW asked if patient had anyone to call that could bring her some batteries, patient stated no. She states her daughter is mad at her right now and working so she does not talk to her, patient states she has not family to call. BSW asked if her aide could help, patient states she has not had an aide in about a week, she had to let her last aide go because she smelled like marijuana and they have not replaced her. BSW contacted patients healthcare agency [facility] at [phone] and left a voicemail. Update 09/30/20:  Patient has aides  available to her at [facility] but chooses not to use them. Discussed with patient about her finding a new agency?  Interventions: Inter-disciplinary care team collaboration (see longitudinal plan of care) Referred patient to community resources care guide team for assistance with transportation. Provided patient with information about  [facility] and [employer]. Discussed plans with patient for ongoing care management follow up and provided patient with direct contact information for care management team Advised patient to call [place] or [employer]. Collaborated with [employer] and [place] regarding transportation. Assisted patient/caregiver with obtaining information about health plan benefits Collaborated with Social Work regarding PCS. Update 08/30/20:  Patient continues to have issues with PCS-SW is investigating. Update 06/14/20:  Patient continues to utilize [employer], aide, [employer] as needed-has missed appointments due to transportation "no shows"-will continue to follow. Update 08/30/20:  patient has been successful in attending appointments with [facility] and [facility] transportation. Update 09/30/20:  Patient has upcoming appointments with [facility] Pharmacist and SW. Patient Self Care Activities:  Patient will call [facility]. Update 06/07/20:  Patient continues to utilize [facility] with success. Update 06/14/20:  Patient has missed appointments due to transportation no shows.  [facility] transportation has been utilized as needed. Update 09/30/20:  Patient with no transportation problems right now-uses [employer] or  as needed for appointments with success.   Care Plan : General Plan of Care (Adult)  Updates made by [name], RN since 09/30/2020 12:00 AM     Problem: Health Promotion or Disease Self-Management (General Plan of Care)   Priority: High  Onset Date: 04/23/2020     Long-Range Goal: Self-Management Plan Developed   Start Date: 04/23/2020  Expected End Date: 11/30/2020  This Visit's Progress: On track  Recent Progress: Not on track  Priority: Medium  Note:   Current Barriers:  Chronic Disease Management support and education needs.  Patient with abdominal wall infection-seen at [facility] ED 05/24/20 -  I&D performed. Update 06/01/20:  Patient states she is improving-HHRN is cleaning and dressing infection. Update 06/07/20:  Infection continues to improve per patient, HHRN continues to clean and dress infection site. Update 06/17/20:  Infection improved, healed.  Nurse Case Manager Clinical Goal(s):  Over the next 30 days, patient will attend all scheduled medical appointments:  Over the next 30 days, patient will work with CM team pharmacist to review medications. Update 05/20/20:  Patient has appointment with Pharmacist 05/21/20. Update 05/25/20:  Patient declines to speak with Pharmacist at this time. Update 08/30/20:  Patient is working with Pharmacist.  Interventions:  Inter-disciplinary care team collaboration (see longitudinal plan of care) Evaluation of current treatment plan and patient's adherence to plan as established by provider. Reviewed medications with patient. Collaborated with pharmacist regarding medications. Discussed plans with patient for ongoing care management follow up and provided patient with direct contact information for care management team Reviewed scheduled/upcoming provider appointments.  Update 05/20/20:  Patient unable to attend PT appointment 05/19/20, has next appointment 05/26/20.  Has Cardiology appointment 05/28/20.  Reviewed [place] information with patient again and with aide. Update 06/07/20:  Patient has PT appointment 06/09/20. Update 06/17/20:  patient attended PT appointment 06/16/20 and also attended appointment at [facility] this week-patient states she feels better. Pharmacy referral for medication review. Update 05/24/20: Pharmacist has attempted to speak with patient and has been unable to.  Patient at Urgent Care today with stomach pain.  RNCM will reschedule appointment with patient and speak with  her regarding importance of speaking with Pharmacist. Update 05/25/20: Patient declines to speak with Pharmacist at this time. Update  08/30/20:  Patient is working with Pharmacist for medication management. Update 09/30/20:  Patient without complaint today, has upcoming appointments with [facility] Pharmacist and SW.  Patient Goals/Self-Care Activities Over the next 30 days, patient will:  -Attends all scheduled provider appointments Calls pharmacy for medication refills Calls provider office for new concerns or questions  Follow Up Plan: The Managed Medicaid care management team will reach out to the patient again over the next 30 days.  The patient has been provided with contact information for the Managed Medicaid care management team and has been advised to call with any health related questions or concerns.     Follow Up:  Patient agrees to Care Plan and Follow-up.  Plan: The Managed Medicaid care management team will reach out to the patient again over the next 30 days. and The patient has been provided with contact information for the Managed Medicaid care management team and has been advised to call with any health related questions or concerns.  Date/time of next scheduled RN care management/care coordination outreach: 10/26/20 at 0900.

## 2020-10-01 ENCOUNTER — Emergency Department (HOSPITAL_COMMUNITY): Payer: Medicaid Other

## 2020-10-01 ENCOUNTER — Other Ambulatory Visit: Payer: Self-pay | Admitting: Obstetrics and Gynecology

## 2020-10-01 ENCOUNTER — Other Ambulatory Visit: Payer: Self-pay

## 2020-10-01 ENCOUNTER — Emergency Department (HOSPITAL_COMMUNITY)
Admission: EM | Admit: 2020-10-01 | Discharge: 2020-10-02 | Disposition: A | Discharge status: Home / Self Care | Payer: Medicaid Other | Attending: Emergency Medicine | Admitting: Emergency Medicine

## 2020-10-01 DIAGNOSIS — R112 Nausea with vomiting, unspecified: Secondary | ICD-10-CM | POA: Insufficient documentation

## 2020-10-01 DIAGNOSIS — R197 Diarrhea, unspecified: Secondary | ICD-10-CM | POA: Diagnosis not present

## 2020-10-01 DIAGNOSIS — R110 Nausea: Secondary | ICD-10-CM | POA: Diagnosis not present

## 2020-10-01 DIAGNOSIS — Z87891 Personal history of nicotine dependence: Secondary | ICD-10-CM | POA: Diagnosis not present

## 2020-10-01 DIAGNOSIS — R079 Chest pain, unspecified: Secondary | ICD-10-CM | POA: Diagnosis present

## 2020-10-01 DIAGNOSIS — R1084 Generalized abdominal pain: Secondary | ICD-10-CM | POA: Insufficient documentation

## 2020-10-01 DIAGNOSIS — R072 Precordial pain: Secondary | ICD-10-CM | POA: Diagnosis not present

## 2020-10-01 DIAGNOSIS — I110 Hypertensive heart disease with heart failure: Secondary | ICD-10-CM | POA: Insufficient documentation

## 2020-10-01 DIAGNOSIS — J45909 Unspecified asthma, uncomplicated: Secondary | ICD-10-CM | POA: Diagnosis not present

## 2020-10-01 DIAGNOSIS — E119 Type 2 diabetes mellitus without complications: Secondary | ICD-10-CM | POA: Insufficient documentation

## 2020-10-01 DIAGNOSIS — Z85850 Personal history of malignant neoplasm of thyroid: Secondary | ICD-10-CM | POA: Diagnosis not present

## 2020-10-01 DIAGNOSIS — R109 Unspecified abdominal pain: Secondary | ICD-10-CM | POA: Diagnosis not present

## 2020-10-01 DIAGNOSIS — I5032 Chronic diastolic (congestive) heart failure: Secondary | ICD-10-CM | POA: Diagnosis not present

## 2020-10-01 DIAGNOSIS — J069 Acute upper respiratory infection, unspecified: Secondary | ICD-10-CM | POA: Insufficient documentation

## 2020-10-01 DIAGNOSIS — D72829 Elevated white blood cell count, unspecified: Secondary | ICD-10-CM | POA: Diagnosis not present

## 2020-10-01 DIAGNOSIS — B9789 Other viral agents as the cause of diseases classified elsewhere: Secondary | ICD-10-CM | POA: Diagnosis not present

## 2020-10-01 DIAGNOSIS — J441 Chronic obstructive pulmonary disease with (acute) exacerbation: Secondary | ICD-10-CM | POA: Diagnosis not present

## 2020-10-01 DIAGNOSIS — Z743 Need for continuous supervision: Secondary | ICD-10-CM | POA: Diagnosis not present

## 2020-10-01 DIAGNOSIS — Z79899 Other long term (current) drug therapy: Secondary | ICD-10-CM | POA: Diagnosis not present

## 2020-10-01 DIAGNOSIS — K760 Fatty (change of) liver, not elsewhere classified: Secondary | ICD-10-CM | POA: Diagnosis not present

## 2020-10-01 DIAGNOSIS — Z20822 Contact with and (suspected) exposure to covid-19: Secondary | ICD-10-CM | POA: Insufficient documentation

## 2020-10-01 DIAGNOSIS — Z7984 Long term (current) use of oral hypoglycemic drugs: Secondary | ICD-10-CM | POA: Diagnosis not present

## 2020-10-01 DIAGNOSIS — R160 Hepatomegaly, not elsewhere classified: Secondary | ICD-10-CM | POA: Diagnosis not present

## 2020-10-01 LAB — CBC WITH DIFFERENTIAL/PLATELET
Abs Immature Granulocytes: 0.07 10*3/uL (ref 0.00–0.07)
Basophils Absolute: 0.1 10*3/uL (ref 0.0–0.1)
Basophils Relative: 0 %
Eosinophils Absolute: 0.2 10*3/uL (ref 0.0–0.5)
Eosinophils Relative: 2 %
HCT: 38 % (ref 36.0–46.0)
Hemoglobin: 11.9 g/dL — ABNORMAL LOW (ref 12.0–15.0)
Immature Granulocytes: 1 %
Lymphocytes Relative: 19 %
Lymphs Abs: 2.4 10*3/uL (ref 0.7–4.0)
MCH: 29 pg (ref 26.0–34.0)
MCHC: 31.3 g/dL (ref 30.0–36.0)
MCV: 92.7 fL (ref 80.0–100.0)
Monocytes Absolute: 0.8 10*3/uL (ref 0.1–1.0)
Monocytes Relative: 6 %
Neutro Abs: 9.3 10*3/uL — ABNORMAL HIGH (ref 1.7–7.7)
Neutrophils Relative %: 72 %
Platelets: 293 10*3/uL (ref 150–400)
RBC: 4.1 MIL/uL (ref 3.87–5.11)
RDW: 13.9 % (ref 11.5–15.5)
WBC: 12.8 10*3/uL — ABNORMAL HIGH (ref 4.0–10.5)
nRBC: 0 % (ref 0.0–0.2)

## 2020-10-01 LAB — COMPREHENSIVE METABOLIC PANEL
ALT: 22 U/L (ref 0–44)
AST: 21 U/L (ref 15–41)
Albumin: 3.9 g/dL (ref 3.5–5.0)
Alkaline Phosphatase: 79 U/L (ref 38–126)
Anion gap: 10 (ref 5–15)
BUN: 11 mg/dL (ref 6–20)
CO2: 34 mmol/L — ABNORMAL HIGH (ref 22–32)
Calcium: 8.7 mg/dL — ABNORMAL LOW (ref 8.9–10.3)
Chloride: 95 mmol/L — ABNORMAL LOW (ref 98–111)
Creatinine, Ser: 0.68 mg/dL (ref 0.44–1.00)
GFR, Estimated: >60 mL/min (ref >60)
Glucose, Bld: 113 mg/dL — ABNORMAL HIGH (ref 70–99)
Potassium: 3.9 mmol/L (ref 3.5–5.1)
Sodium: 139 mmol/L (ref 135–145)
Total Bilirubin: 0.3 mg/dL (ref 0.3–1.2)
Total Protein: 7.2 g/dL (ref 6.5–8.1)

## 2020-10-01 LAB — URINALYSIS, ROUTINE W REFLEX MICROSCOPIC
Bilirubin Urine: NEGATIVE
Glucose, UA: NEGATIVE mg/dL
Hgb urine dipstick: NEGATIVE
Ketones, ur: NEGATIVE mg/dL
Leukocytes,Ua: NEGATIVE
Nitrite: NEGATIVE
Protein, ur: NEGATIVE mg/dL
Specific Gravity, Urine: 1.005 (ref 1.005–1.030)
pH: 5 (ref 5.0–8.0)

## 2020-10-01 LAB — RESP PANEL BY RT-PCR (FLU A&B, COVID) ARPGX2
Influenza A by PCR: NEGATIVE
Influenza B by PCR: NEGATIVE
SARS Coronavirus 2 by RT PCR: NEGATIVE

## 2020-10-01 LAB — TROPONIN I (HIGH SENSITIVITY)
Troponin I (High Sensitivity): 5 ng/L (ref <18)
Troponin I (High Sensitivity): 4 ng/L (ref <18)

## 2020-10-01 LAB — LIPASE, BLOOD: Lipase: 35 U/L (ref 11–51)

## 2020-10-01 LAB — CBG MONITORING, ED: Glucose-Capillary: 165 mg/dL — ABNORMAL HIGH (ref 70–99)

## 2020-10-01 MED ORDER — IOHEXOL 300 MG/ML  SOLN
100.0000 mL | Freq: Once | INTRAMUSCULAR | Status: AC | PRN
  Administered 2020-10-01: 100 mL via INTRAVENOUS

## 2020-10-01 MED ORDER — MORPHINE SULFATE (PF) 4 MG/ML IV SOLN
4.0000 mg | Freq: Once | INTRAVENOUS | Status: DC
  Filled 2020-10-01: qty 1

## 2020-10-01 MED ORDER — ONDANSETRON HCL 4 MG/2ML IJ SOLN
4.0000 mg | Freq: Once | INTRAMUSCULAR | Status: AC
  Administered 2020-10-02: 4 mg via INTRAVENOUS
  Filled 2020-10-01 (×2): qty 2

## 2020-10-01 NOTE — Patient Outreach (Signed)
Care Coordination  10/01/2020  Michele Owens [birth year] [id number]  RNCM returned patient's phone call.  Patient states she does not feel well today.  Encouraged patient to call her PCP or proceed to Urgent Care, ED as needed.  Patient states she feels weak and her stomach is bothering her.  [name] RN, BSN [place]  [facility] - Managed Medicaid High Risk [phone].

## 2020-10-01 NOTE — ED Triage Notes (Signed)
Pt coming from home via [facility] for nausea; states she vomited once, had abd pain but it went away. VSS on arrival

## 2020-10-01 NOTE — ED Provider Notes (Signed)
Emergency Medicine Provider Triage Evaluation Note  Michele Owens , a 60 y.o. female  was evaluated in triage.  Pt complains of nausea.  Patient has history of type II DM, GERD, COPD  She arrived from home via EMS for nausea symptoms.  On my examination, patient reports that she is actually having multiple symptoms.  She states that she has been having cold symptoms for the past couple of days.  She is now having chest discomfort and feels weak.  She states that she is a fall risk.  She had a colonoscopy performed 09/23/2020 and is very reassured that there was no obvious colon cancer.  In addition to her chest discomfort today, she states that she has had two episodes of nausea and vomiting. She continues to spit up in emesis bag.   She keeps communicating to me that she is not safe at home.  She also wants pain medications for her abdominal and back pain.  Chronic SOB.  Review of Systems  Positive: Abdominal pain, N/V, chest pain, chills Negative: Fevers.  Physical Exam  BP 127/67   Pulse 91   Temp 98.4 F (36.9 C) (Oral)   Resp 20   SpO2 95%  Gen:   Awake, no distress   Resp:  Normal effort  MSK:   Moves extremities without difficulty  Other:    Medical Decision Making  Medically screening exam initiated at 6:23 PM.  Appropriate orders placed.  Michele Owens was informed that the remainder of the evaluation will be completed by another provider, this initial triage assessment does not replace that evaluation, and the importance of remaining in the ED until their evaluation is complete.     Michele Herter, PA-C 10/01/20 [place], [place], DO 10/01/20 2057

## 2020-10-01 NOTE — ED Provider Notes (Signed)
[facility] EMERGENCY DEPARTMENT Provider Note   CSN: [id number] Arrival date & time: 10/01/20  1814     History Chief Complaint  Patient presents with   Abdominal Pain   Nausea    Michele Owens is a 60 y.o. female with a history of chronic diastolic CHF, COPD (on 3 LPM of O2 via nasal cannula continuously), hypertension, diabetes, GERD, orbit obesity.  Patient presents emergency department with a chief complaint of chest pain, cold symptoms, and abdominal pain.  Patient reports that she has had cold symptoms since last Thursday 6/16.  Patient endorses productive cough.  States that cough is producing white mucus.  Patient also endorses congestion, rhinorrhea, chills, and subjective fevers.  Patient denies any known sick contacts.   Patient complains of abdominal pain, nausea, and vomiting.  Patient reports that her symptoms began earlier this morning.  Patient reports that abdominal pain is periumbilical and does not radiate.  Patient described pain as a "aching."  Patient rates her pain 10/10 on the pain scale.  Patient denies any alleviating or aggravating factors.  Patient reports 2 episodes of vomiting today.  Patient states that emesis was stomach contents.  Patient denies any hematemesis or coffee-ground emesis.  Patient also endorses diarrhea.  Patient denies any dysuria, hematuria, vaginal bleeding, vaginal discharge, vaginal pain.  Patient also complains of chest pain.  Patient states that chest pain started earlier today at approximately noon.  Pain is located to the right side of her chest.  Pain does not move anywhere.  Patient described pain as "achy."  Patient rates pain 10/10 on the pain scale.  Patient denies any alleviating or aggravating factors.  No associated diaphoresis or shortness of breath.  Patient denies any leg swelling, hemoptysis, surgery last 12 weeks, history of cancer treatment.  She denies any illicit drug use, alcohol use, or or tobacco  use.   Abdominal Pain Associated symptoms: chest pain, chills, cough, diarrhea, fever (subjective), nausea and vomiting   Associated symptoms: no constipation, no dysuria, no hematuria, no shortness of breath, no vaginal bleeding and no vaginal discharge       Past Medical History:  Diagnosis Date   Anxiety    Arthritis    Asthma    Chronic diastolic CHF (congestive heart failure) (HCC)    COPD (chronic obstructive pulmonary disease) (HCC)    Uses Oxygen at night   Depression    Diabetes mellitus without complication (HCC)    Dyspnea    occ   Family history of thyroid cancer    GERD (gastroesophageal reflux disease)    Gout    Headache    migraines   History of DVT of lower extremity    History of nuclear stress test    Myoview 2/17:  Low risk stress nuclear study with a small, moderate intensity, partially reversible inferior lateral defect consistent with small prior infarct and minimal peri-infarct ischemia; EF 68 with normal wall motion   Hypertension    Pneumonia    Thyroid cancer ([place]) 09/23/2016    Patient Active Problem List   Diagnosis Date Noted   Acute on chronic respiratory failure with hypoxia and hypercapnia ([place]) 04/18/2020   Acute on chronic respiratory failure with hypoxia ([place]) 04/18/2020   Hemoptysis 95/18/8416   Acute metabolic encephalopathy 60/63/0160   Acute encephalopathy 04/18/2020   Chronic diastolic CHF (congestive heart failure) ([place]) 04/18/2020   Sepsis ([place]) 04/18/2020   Morbid obesity with BMI of 50.0-59.9, adult ([place])    Fall  at home, initial encounter 06/23/2018   HNP (herniated nucleus pulposus), thoracic 06/23/2018   CHF (congestive heart failure) ([place]) 06/18/2018   Hypertensive disorder 08/28/2017   Obesity 08/28/2017   COPD exacerbation ([place]) 08/04/2017   Acute and chronic respiratory failure with hypercapnia ([place])    Pulmonary HTN ([place])    Genetic testing 02/27/2017   Family history of thyroid cancer    Hypomagnesemia  10/03/2016   Atypical chest pain    HLD (hyperlipidemia) 09/23/2016   Gout 09/23/2016   DVT (deep vein thrombosis) in pregnancy 09/23/2016   Thyroid cancer ([place]) 09/23/2016   Hypocalcemia 09/23/2016   AKI (acute kidney injury) ([place]) 09/23/2016   Acute pulmonary edema (HCC)    Acute hypoxemic respiratory failure (HCC)    Ventilator dependent (HCC)    Chronic respiratory failure with hypoxia and hypercapnia ([place]) 07/16/2016   CAP (community acquired pneumonia) 01/23/2016   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 01/23/2016   Pulmonary hypertension ([place]) 01/23/2016   Obesity hypoventilation syndrome ([place]) 08/26/2015   Dyslipidemia associated with type 2 diabetes mellitus ([place]) 04/24/2015   Leukocytosis 04/24/2015   Controlled type 2 diabetes mellitus without complication, without long-term current use of insulin ([place]) 04/24/2015   Anxiety 04/24/2015   Benign essential HTN 04/24/2015   Normocytic anemia 04/24/2015   OSA (obstructive sleep apnea) 05/10/2012   Tobacco abuse 07/04/2011    Past Surgical History:  Procedure Laterality Date   BUNIONECTOMY Bilateral    CARDIAC CATHETERIZATION N/A 06/23/2015   Procedure: Right/Left Heart Cath and Coronary Angiography;  Surgeon: [name], MD;  Location: [facility];  Service: Cardiovascular;  Laterality: N/A;   COLONOSCOPY WITH PROPOFOL N/A 12/15/2015   Procedure: COLONOSCOPY WITH PROPOFOL;  Surgeon: [name], MD;  Location: [facility];  Service: Endoscopy;  Laterality: N/A;   RIGHT HEART CATH N/A 10/01/2019   Procedure: RIGHT HEART CATH;  Surgeon: [name], MD;  Location: [facility];  Service: Cardiovascular;  Laterality: N/A;   THYROIDECTOMY  09/19/2016   THYROIDECTOMY N/A 09/19/2016   Procedure: TOTAL THYROIDECTOMY;  Surgeon: [name], MD;  Location: [facility];  Service: General;  Laterality: N/A;   TONSILLECTOMY     TOTAL ABDOMINAL HYSTERECTOMY  07/14/10     OB History   No obstetric history  on file.     Family History  Problem Relation Age of Onset   Cancer Father        thought to be due to exposure to concrete   Diabetes Mother    Thyroid cancer Mother        dx in her 41s-60s   Cancer Maternal Uncle        2 uncles with cancer NOS   Brain cancer Paternal Aunt    Cancer Cousin        maternal first cousin - NOS   Cancer Cousin        maternal first cousin - NOS    Social History   Tobacco Use   Smoking status: Former    Packs/day: 0.50    Years: 41.00    Pack years: 20.50    Types: Cigarettes    Quit date: 07/2016    Years since quitting: 4.2   Smokeless tobacco: Never   Tobacco comments:    Starting to [employer] -- using Nicotine Patch  Vaping Use   Vaping Use: Never used  Substance Use Topics   Alcohol use: Not Currently    Alcohol/week: 0.0 standard drinks  Comment: ? none now   Drug use: Never    Comment: none for long time    Home Medications Prior to Admission medications   Medication Sig Start Date End Date Taking? Authorizing Provider  albuterol (PROVENTIL HFA;VENTOLIN HFA) 108 (90 Base) MCG/ACT inhaler Inhale 1-2 puffs into the lungs every 6 (six) hours as needed for wheezing or shortness of breath. 04/23/18   Long, [name], MD  allopurinol (ZYLOPRIM) 100 MG tablet Take 100 mg by mouth daily.    [provider]  ANORO ELLIPTA 62.5-25 MCG/INH AEPB Inhale 1 puff into the lungs daily.  04/11/18   [provider]  benzonatate (TESSALON) 200 MG capsule Take 1 capsule (200 mg total) by mouth in the morning, at noon, and at bedtime. 7 DS 04/13/20   Regalado, Jerald Kief A, MD  Blood Glucose Monitoring Suppl (ACCU-CHEK AVIVA PLUS) w/Device KIT 1 each by Other route daily.  08/07/17   [provider]  butalbital-acetaminophen-caffeine (FIORICET) 50-325-40 MG tablet Take 1 tablet by mouth 2 (two) times daily as needed for migraine. 08/13/20   [provider]  calcitRIOL (ROCALTROL) 0.5 MCG capsule Take 1 capsule (0.5 mcg  total) by mouth daily. 09/26/16   Hongalgi, [name], MD  carvedilol (COREG) 6.25 MG tablet Take 1 tablet (6.25 mg total) by mouth 2 (two) times daily with a meal. 01/23/18   Bensimhon, [name], MD  cefdinir (OMNICEF) 300 MG capsule TAKE 1 CAPSULE (300 MG TOTAL) BY MOUTH TWO TIMES DAILY FOR 4 DAYS. Patient not taking: No sig reported 04/20/20 04/20/21  [name], MD  clonazePAM (KLONOPIN) 2 MG tablet Take 1 tablet (2 mg total) by mouth 3 (three) times daily. for anxiety Patient taking differently: Take 2 mg by mouth 3 (three) times daily. 06/23/19   [name], MD  cyclobenzaprine (FLEXERIL) 10 MG tablet Take 10 mg by mouth 3 (three) times daily. 11/19/19   [provider]  diclofenac sodium (VOLTAREN) 1 % GEL Apply 2 g topically 4 (four) times daily as needed (for pain). Patient not taking: No sig reported 06/19/18   [name], MD  EPINEPHrine 0.3 mg/0.3 mL IJ SOAJ injection Inject 0.3 mg into the muscle daily as needed for anaphylaxis.    [provider]  FEROSUL 325 (65 Fe) MG tablet Take 325 mg by mouth daily. 03/22/17   [provider]  glucose blood test strip Use as instructed to check blood sugar up to 4 times daily Patient taking differently: 1 each by Other route See admin instructions. Use as instructed to check blood sugar up to 4 times daily 08/06/17   [name], DO  guaiFENesin (MUCINEX) 600 MG 12 hr tablet Take 2 tablets (1,200 mg total) by mouth 2 (two) times daily. 04/13/20   Regalado, Belkys A, MD  ipratropium-albuterol (DUONEB) 0.5-2.5 (3) MG/3ML SOLN Take 3 mLs by nebulization 4 (four) times daily.  04/05/17   [provider]  levothyroxine (SYNTHROID, LEVOTHROID) 112 MCG tablet Take 224 mcg by mouth daily. 07/11/17   [provider]  lisinopril (PRINIVIL,ZESTRIL) 5 MG tablet Take 1 tablet (5 mg total) by mouth daily. Please call for office visit [phone] Patient taking differently: Take 5 mg by mouth daily. 08/15/17    [name], MD  loperamide (IMODIUM) 2 MG capsule Take 1 capsule (2 mg total) by mouth 4 (four) times daily as needed for diarrhea or loose stools. 11/02/34   [name], MD  metFORMIN (GLUCOPHAGE) 500 MG tablet Take 1 tablet (  500 mg total) by mouth 2 (two) times daily with a meal. 12/11/14   [name], MD  nitroGLYCERIN (NITROSTAT) 0.4 MG SL tablet Place 1 tablet (0.4 mg total) under the tongue every 5 (five) minutes as needed for chest pain. 06/19/18   [name], MD  nystatin cream (MYCOSTATIN) Apply 1 application topically 2 (two) times daily as needed for dry skin. 04/13/20   Regalado, Belkys A, MD  omeprazole (PRILOSEC) 20 MG capsule Take 20 mg by mouth 2 (two) times daily before a meal. 11/19/19   [provider]  oxyCODONE-acetaminophen (PERCOCET) 10-325 MG tablet Take 1 tablet by mouth every 6 (six) hours as needed for pain. 12/03/19   [provider]  OXYGEN Inhale 2-3 L into the lungs continuous.    [provider]  polyethylene glycol (MIRALAX / GLYCOLAX) 17 g packet Take 17 g by mouth daily as needed for mild constipation.    [provider]  potassium chloride SA (K-DUR,KLOR-CON) 20 MEQ tablet Take 1 tablet (20 mEq total) by mouth 2 (two) times daily. 07/24/18   [name], MD  QC STOOL SOFTENER PLS LAXATIVE 8.6-50 MG tablet Take 1 tablet by mouth at bedtime. 04/11/17   [provider]  rosuvastatin (CRESTOR) 10 MG tablet Take 10 mg by mouth every evening. 11/19/19   [provider]  torsemide (DEMADEX) 20 MG tablet TAKE 4 (FOUR) TABLETS BY MOUTH DAILY (2AM+ 2NOON) Patient taking differently: Take 40 mg by mouth 2 (two) times daily. 05/14/20   [name], MD  traZODone (DESYREL) 50 MG tablet Take 100 mg by mouth at bedtime. 11/19/19   [provider]    Allergies    Bee venom, Fioricet [butalbital-apap-caffeine], Ibuprofen, Lamisil [terbinafine], and Nsaids  Review of Systems   Review of Systems   Constitutional:  Positive for chills and fever (subjective).  HENT:  Positive for congestion and rhinorrhea.   Eyes:  Negative for visual disturbance.  Respiratory:  Positive for cough. Negative for shortness of breath.   Cardiovascular:  Positive for chest pain. Negative for palpitations and leg swelling.  Gastrointestinal:  Positive for abdominal pain, diarrhea, nausea and vomiting. Negative for abdominal distention, anal bleeding, blood in stool, constipation and rectal pain.  Genitourinary:  Negative for difficulty urinating, dysuria, hematuria, vaginal bleeding and vaginal discharge.  Musculoskeletal:  Positive for back pain. Negative for neck pain.  Skin:  Negative for color change, rash and wound.  Neurological:  Negative for dizziness, syncope, light-headedness and headaches.  Psychiatric/Behavioral:  Negative for confusion.    Physical Exam Updated Vital Signs BP 124/67   Pulse 99   Temp 98.4 F (36.9 C) (Oral)   Resp (!) 22   SpO2 99%   Physical Exam Vitals and nursing note reviewed.  Constitutional:      General: She is not in acute distress.    Appearance: She is morbidly obese. She is not ill-appearing, toxic-appearing or diaphoretic.     Interventions: Nasal cannula in place.     Comments: On 3 LPM of O2 via nasal cannula  HENT:     Head: Normocephalic.  Eyes:     General: No scleral icterus.       Right eye: No discharge.        Left eye: No discharge.  Cardiovascular:     Rate and Rhythm: Normal rate.     Pulses:          Dorsalis pedis pulses are 2+ on the right  side and 2+ on the left side.     Heart sounds: Normal heart sounds.  Pulmonary:     Effort: Pulmonary effort is normal. No tachypnea, bradypnea or respiratory distress.     Breath sounds: Normal breath sounds. No stridor.     Comments: Patient able to speak in full complete sentences without difficulty.  Lungs clear to auscultation bilaterally Abdominal:     General: Bowel sounds are normal.  There is no distension. There are no signs of injury.     Palpations: Abdomen is soft. There is no mass or pulsatile mass.     Tenderness: There is generalized abdominal tenderness. There is no guarding or rebound.     Comments: Exam is hindered by patient's body habitus Diffuse tenderness throughout abdomen  Musculoskeletal:     Cervical back: Neck supple.     Right lower leg: No swelling, deformity, lacerations, tenderness or bony tenderness. 1+ Edema present.     Left lower leg: No swelling, deformity, lacerations, tenderness or bony tenderness. 1+ Edema present.  Feet:     Right foot:     Skin integrity: Callus and dry skin present. No ulcer, blister, skin breakdown, erythema, warmth or fissure.     Left foot:     Skin integrity: Callus and dry skin present. No ulcer, blister, skin breakdown, erythema, warmth or fissure.  Skin:    General: Skin is warm and dry.  Neurological:     General: No focal deficit present.     Mental Status: She is alert and oriented to person, place, and time.     GCS: GCS eye subscore is 4. GCS verbal subscore is 5. GCS motor subscore is 6.  Psychiatric:        Behavior: Behavior is cooperative.    ED Results / Procedures / Treatments   Labs (all labs ordered are listed, but only abnormal results are displayed) Labs Reviewed  CBC WITH DIFFERENTIAL/PLATELET - Abnormal; Notable for the following components:      Result Value   WBC 12.8 (*)    Hemoglobin 11.9 (*)    Neutro Abs 9.3 (*)    All other components within normal limits  COMPREHENSIVE METABOLIC PANEL - Abnormal; Notable for the following components:   Chloride 95 (*)    CO2 34 (*)    Glucose, Bld 113 (*)    Calcium 8.7 (*)    All other components within normal limits  URINALYSIS, ROUTINE W REFLEX MICROSCOPIC - Abnormal; Notable for the following components:   Color, Urine STRAW (*)    All other components within normal limits  CBG MONITORING, ED - Abnormal; Notable for the following  components:   Glucose-Capillary 165 (*)    All other components within normal limits  RESP PANEL BY RT-PCR (FLU A&B, COVID) ARPGX2  LIPASE, BLOOD  TROPONIN I (HIGH SENSITIVITY)  TROPONIN I (HIGH SENSITIVITY)    EKG None  Radiology CT ABDOMEN PELVIS W CONTRAST  Result Date: 10/02/2020 CLINICAL DATA:  Acute nonlocalized abdominal pain.  Nausea. EXAM: CT ABDOMEN AND PELVIS WITH CONTRAST TECHNIQUE: Multidetector CT imaging of the abdomen and pelvis was performed using the standard protocol following bolus administration of intravenous contrast. CONTRAST:  144m OMNIPAQUE IOHEXOL 300 MG/ML  SOLN COMPARISON:  CT 06/22/2020 FINDINGS: Lower chest: Similar cardiomegaly. Linear airspace disease in both lower lobes, favor atelectasis. Hepatobiliary: Enlarged liver spanning at least 22.7 cm cranial caudal with steatosis. There is question of micro nodularity of the hepatic contour. No focal lesion. Gallbladder  physiologically distended, no calcified stone. No biliary dilatation. Pancreas: No ductal dilatation or inflammation. Spleen: No splenomegaly.  No focal abnormality. Adrenals/Urinary Tract: Normal adrenal glands. No hydronephrosis or perinephric edema. Homogeneous renal enhancement. No focal renal lesion or evidence of stone. Urinary bladder is physiologically distended without wall thickening. Stomach/Bowel: Ingested material within the stomach. There is no small bowel obstruction or inflammation. Normal air-filled appendix. Small to moderate volume of colonic stool. No colonic wall thickening or inflammation. Vascular/Lymphatic: Mild aortic atherosclerosis. No aortic aneurysm. Patent portal vein. No portal venous or mesenteric gas. No abdominopelvic adenopathy. Reproductive: Hysterectomy.  Quiescent ovaries.  No adnexal mass. Other: No ascites or free air. Tiny fat containing umbilical hernia. Musculoskeletal: There are no acute or suspicious osseous abnormalities. IMPRESSION: 1. No acute abnormality in  the abdomen/pelvis. 2. Hepatomegaly and hepatic steatosis. There is question of micro nodularity of the hepatic contour, raising concern for cirrhosis. Recommend correlation with cirrhosis risk factors. Aortic Atherosclerosis (ICD10-I70.0). Electronically Signed   By: Keith Rake M.D.   On: 10/02/2020 00:01   DG Chest Portable 1 View  Result Date: 10/01/2020 CLINICAL DATA:  Nausea. EXAM: PORTABLE CHEST 1 VIEW COMPARISON:  Aug 21, 2020 FINDINGS: Mildly decreased lung volumes are seen. Mild to moderate severity linear scarring and/or atelectasis is noted within the bilateral lung bases. There is no evidence of a pleural effusion or pneumothorax. The cardiac silhouette is enlarged and unchanged in size. Multilevel degenerative changes are seen throughout the thoracic spine. IMPRESSION: Mild to moderate severity bibasilar linear scarring and/or atelectasis. Electronically Signed   By: Virgina Norfolk M.D.   On: 10/01/2020 22:36    Procedures Procedures   Medications Ordered in ED Medications  ondansetron (ZOFRAN) injection 4 mg (4 mg Intravenous Given 10/02/20 0111)  iohexol (OMNIPAQUE) 300 MG/ML solution 100 mL (100 mLs Intravenous Contrast Given 10/01/20 2349)  acetaminophen (TYLENOL) tablet 1,000 mg (1,000 mg Oral Given 10/02/20 0112)    ED Course  I have reviewed the triage vital signs and the nursing notes.  Pertinent labs & imaging results that were available during my care of the patient were reviewed by me and considered in my medical decision making (see chart for details).    MDM Rules/Calculators/A&P                          Alert 60 year old female no acute distress, nontoxic-appearing.  Patient presents emergency department with multiple complaints.  Patient complains of flulike symptoms, nausea/vomiting, and chest discomfort.  Patient reports that chest pain started earlier today at approximately noon.  Chest pain is located to the rest of right side of her chest.  No  radiation of pain.  Pain described as achy.  Patient rates pain 10/10 on the pain scale.  Patient denies any associated shortness of breath or diaphoresis.  ACS work-up was initiated while patient was in triage. EKG shows normal sinus rhythm.   Chest x-ray shows mild to moderate severity bibasilar linear scarring and/or atelectasis. Troponin 5 and 4 with delta of -1. Low suspicion for ACS at this time.  Patient endorses nausea, vomiting, and periumbilical abdominal pain.  Patient reports that her symptoms started today.  Patient endorses vomiting twice.  Patient denies any coffee-ground emesis or hematemesis.  Patient describes emesis as, contents. On physical exam abdomen soft, nondistended, generalized tenderness throughout abdomen.  No guarding or rebound tenderness.  Abdominal exam is hindered by patient's body habitus. Urinalysis shows no signs of infection Lipase within  normal limits. CMP shows AST, ALT, alk phos, and total bili all within normal limits. CBC shows leukocytosis at 12.8.  And hemoglobin at 11.9. Will obtain CT abdomen pelvis to further evaluate for patient's diffuse abdominal tenderness. CT abdomen pelvis shows no acute abnormality in the abdomen/pelvis.  Hepatomegaly and hepatic steatosis noted.  There is a question of micro nodularity of the hepatic contour, raising concern for cirrhosis.  We will have patient follow-up with primary care provider in outpatient setting for further work-up of possible cirrhosis and repeat imaging.  Respiratory panel obtained to evaluate for patient's cold symptoms.  Respiratory panel negative for COVID-19 and influenza.  Suspect possible viral upper respiratory infection as cause of symptoms.  Was contacted by patient's RN due to patient having increased somnolence.  Patient was arousable to voice.  Patient had no facial asymmetry, slurred speech, or unilateral weakness.  We will hold morphine at this time.  Will obtain CBG.  On serial right  examination patient is in no acute distress.  Patient is hemodynamically stable.  Patient deemed stable for discharge at this time.  Outpatient follow-up with primary care provider.  Patient given strict return precautions.  Prior to discharge patient is requesting medication for her abdominal pain.  Patient is requesting oxycodone as she is prescribed this medication at home.  Per PDMP patient received 30-day prescription of oxycodone-acetaminophen 10-325 mg on 09/10/2020.  Patient was advised to take this medication as prescribed.  Patient states that she no longer has this medication at home.  I do not feel comfortable prescribing patient with additional opiate pain medication.  She was advised to contact her primary care provider for any further issues regarding her pain medication.  We will give patient Tylenol for pain management at this time.  Final Clinical Impression(s) / ED Diagnoses Final diagnoses:  Precordial chest pain  Viral upper respiratory tract infection  Non-intractable vomiting with nausea, unspecified vomiting type    Rx / DC Orders ED Discharge Orders     None        Loni Beckwith, PA-C 10/02/20 0243    Lucrezia Starch, MD 10/04/20 [phone]

## 2020-10-01 NOTE — ED Notes (Signed)
Pt noted to be extremely drowsy, unable to wake up to answer my questions. PA made aware. Verbal orders to hold morphine. POC CBG completed.

## 2020-10-02 MED ORDER — ACETAMINOPHEN 500 MG PO TABS
1000.0000 mg | ORAL_TABLET | Freq: Once | ORAL | Status: AC
  Administered 2020-10-02: 1000 mg via ORAL
  Filled 2020-10-02: qty 2

## 2020-10-02 MED ORDER — ACETAMINOPHEN 325 MG PO TABS: 650.0000 mg | ORAL_TABLET | Freq: Once | ORAL | Status: DC

## 2020-10-02 NOTE — Discharge Instructions (Addendum)
You came to the emergency department today to be evaluated for your chest pain, cold symptoms, nausea and vomiting.  Your physical exam and lab work were reassuring.  Your EKG and lab work were reassuring you are not having a acute heart attack today.  The scan of your abdomen and pelvis showed no acute problems.  It did show small nodule on your liver.  You will need to follow-up with your primary care provider to have imaging performed in the outpatient setting to further evaluate this.  Your COVID-19 and flu test were negative today.  Get help right away if: You have shortness of breath that gets worse. You have severe or persistent: Headache. Ear pain. Sinus pain. Chest pain. You have chronic lung disease along with any of the following: Wheezing. Prolonged cough. Coughing up blood. A change in your usual mucus. You have a stiff neck. You have changes in your: Vision. Hearing. Thinking. Mood.

## 2020-10-03 ENCOUNTER — Emergency Department (HOSPITAL_COMMUNITY): Payer: Medicaid Other

## 2020-10-03 ENCOUNTER — Emergency Department (HOSPITAL_COMMUNITY)
Admission: EM | Admit: 2020-10-03 | Discharge: 2020-10-04 | Disposition: A | Discharge status: Home / Self Care | Payer: Medicaid Other | Attending: Emergency Medicine | Admitting: Emergency Medicine

## 2020-10-03 DIAGNOSIS — S0990XA Unspecified injury of head, initial encounter: Secondary | ICD-10-CM | POA: Diagnosis not present

## 2020-10-03 DIAGNOSIS — S02832A Fracture of medial orbital wall, left side, initial encounter for closed fracture: Secondary | ICD-10-CM | POA: Insufficient documentation

## 2020-10-03 DIAGNOSIS — I110 Hypertensive heart disease with heart failure: Secondary | ICD-10-CM | POA: Insufficient documentation

## 2020-10-03 DIAGNOSIS — W19XXXA Unspecified fall, initial encounter: Secondary | ICD-10-CM

## 2020-10-03 DIAGNOSIS — H579 Unspecified disorder of eye and adnexa: Secondary | ICD-10-CM | POA: Diagnosis not present

## 2020-10-03 DIAGNOSIS — J441 Chronic obstructive pulmonary disease with (acute) exacerbation: Secondary | ICD-10-CM | POA: Insufficient documentation

## 2020-10-03 DIAGNOSIS — Z79899 Other long term (current) drug therapy: Secondary | ICD-10-CM | POA: Diagnosis not present

## 2020-10-03 DIAGNOSIS — Y9289 Other specified places as the place of occurrence of the external cause: Secondary | ICD-10-CM | POA: Diagnosis not present

## 2020-10-03 DIAGNOSIS — Z85850 Personal history of malignant neoplasm of thyroid: Secondary | ICD-10-CM | POA: Diagnosis not present

## 2020-10-03 DIAGNOSIS — M7989 Other specified soft tissue disorders: Secondary | ICD-10-CM | POA: Diagnosis not present

## 2020-10-03 DIAGNOSIS — Y92009 Unspecified place in unspecified non-institutional (private) residence as the place of occurrence of the external cause: Secondary | ICD-10-CM

## 2020-10-03 DIAGNOSIS — R6889 Other general symptoms and signs: Secondary | ICD-10-CM | POA: Diagnosis not present

## 2020-10-03 DIAGNOSIS — W01198A Fall on same level from slipping, tripping and stumbling with subsequent striking against other object, initial encounter: Secondary | ICD-10-CM | POA: Insufficient documentation

## 2020-10-03 DIAGNOSIS — Z7984 Long term (current) use of oral hypoglycemic drugs: Secondary | ICD-10-CM | POA: Diagnosis not present

## 2020-10-03 DIAGNOSIS — Y9301 Activity, walking, marching and hiking: Secondary | ICD-10-CM | POA: Diagnosis not present

## 2020-10-03 DIAGNOSIS — G4489 Other headache syndrome: Secondary | ICD-10-CM | POA: Diagnosis not present

## 2020-10-03 DIAGNOSIS — I5032 Chronic diastolic (congestive) heart failure: Secondary | ICD-10-CM | POA: Diagnosis not present

## 2020-10-03 DIAGNOSIS — Z043 Encounter for examination and observation following other accident: Secondary | ICD-10-CM | POA: Diagnosis not present

## 2020-10-03 DIAGNOSIS — S0232XA Fracture of orbital floor, left side, initial encounter for closed fracture: Secondary | ICD-10-CM | POA: Diagnosis not present

## 2020-10-03 DIAGNOSIS — J45909 Unspecified asthma, uncomplicated: Secondary | ICD-10-CM | POA: Diagnosis not present

## 2020-10-03 DIAGNOSIS — Z87891 Personal history of nicotine dependence: Secondary | ICD-10-CM | POA: Insufficient documentation

## 2020-10-03 DIAGNOSIS — Z7951 Long term (current) use of inhaled steroids: Secondary | ICD-10-CM | POA: Insufficient documentation

## 2020-10-03 DIAGNOSIS — Z23 Encounter for immunization: Secondary | ICD-10-CM | POA: Insufficient documentation

## 2020-10-03 DIAGNOSIS — R0981 Nasal congestion: Secondary | ICD-10-CM | POA: Insufficient documentation

## 2020-10-03 DIAGNOSIS — Z743 Need for continuous supervision: Secondary | ICD-10-CM | POA: Diagnosis not present

## 2020-10-03 DIAGNOSIS — E119 Type 2 diabetes mellitus without complications: Secondary | ICD-10-CM | POA: Insufficient documentation

## 2020-10-03 DIAGNOSIS — S0592XA Unspecified injury of left eye and orbit, initial encounter: Secondary | ICD-10-CM | POA: Diagnosis present

## 2020-10-03 LAB — BASIC METABOLIC PANEL
Anion gap: 10 (ref 5–15)
BUN: 14 mg/dL (ref 6–20)
CO2: 33 mmol/L — ABNORMAL HIGH (ref 22–32)
Calcium: 8.8 mg/dL — ABNORMAL LOW (ref 8.9–10.3)
Chloride: 95 mmol/L — ABNORMAL LOW (ref 98–111)
Creatinine, Ser: 0.77 mg/dL (ref 0.44–1.00)
GFR, Estimated: >60 mL/min (ref >60)
Glucose, Bld: 125 mg/dL — ABNORMAL HIGH (ref 70–99)
Potassium: 3.5 mmol/L (ref 3.5–5.1)
Sodium: 138 mmol/L (ref 135–145)

## 2020-10-03 LAB — CBC WITH DIFFERENTIAL/PLATELET
Abs Immature Granulocytes: 0.09 10*3/uL — ABNORMAL HIGH (ref 0.00–0.07)
Basophils Absolute: 0.1 10*3/uL (ref 0.0–0.1)
Basophils Relative: 0 %
Eosinophils Absolute: 0.2 10*3/uL (ref 0.0–0.5)
Eosinophils Relative: 1 %
HCT: 36.9 % (ref 36.0–46.0)
Hemoglobin: 11.4 g/dL — ABNORMAL LOW (ref 12.0–15.0)
Immature Granulocytes: 1 %
Lymphocytes Relative: 12 %
Lymphs Abs: 1.9 10*3/uL (ref 0.7–4.0)
MCH: 29.2 pg (ref 26.0–34.0)
MCHC: 30.9 g/dL (ref 30.0–36.0)
MCV: 94.4 fL (ref 80.0–100.0)
Monocytes Absolute: 0.9 10*3/uL (ref 0.1–1.0)
Monocytes Relative: 6 %
Neutro Abs: 12.7 10*3/uL — ABNORMAL HIGH (ref 1.7–7.7)
Neutrophils Relative %: 80 %
Platelets: 299 10*3/uL (ref 150–400)
RBC: 3.91 MIL/uL (ref 3.87–5.11)
RDW: 13.9 % (ref 11.5–15.5)
WBC: 15.8 10*3/uL — ABNORMAL HIGH (ref 4.0–10.5)
nRBC: 0 % (ref 0.0–0.2)

## 2020-10-03 LAB — I-STAT CHEM 8, ED
BUN: 14 mg/dL (ref 6–20)
Calcium, Ion: 1.11 mmol/L — ABNORMAL LOW (ref 1.15–1.40)
Chloride: 94 mmol/L — ABNORMAL LOW (ref 98–111)
Creatinine, Ser: 0.7 mg/dL (ref 0.44–1.00)
Glucose, Bld: 121 mg/dL — ABNORMAL HIGH (ref 70–99)
HCT: 38 % (ref 36.0–46.0)
Hemoglobin: 12.9 g/dL (ref 12.0–15.0)
Potassium: 3.6 mmol/L (ref 3.5–5.1)
Sodium: 139 mmol/L (ref 135–145)
TCO2: 36 mmol/L — ABNORMAL HIGH (ref 22–32)

## 2020-10-03 MED ORDER — FLUORESCEIN SODIUM 1 MG OP STRP
1.0000 | ORAL_STRIP | Freq: Once | OPHTHALMIC | Status: AC
  Administered 2020-10-03: 1 via OPHTHALMIC
  Filled 2020-10-03: qty 1

## 2020-10-03 MED ORDER — TETRACAINE HCL 0.5 % OP SOLN
2.0000 [drp] | Freq: Once | OPHTHALMIC | Status: AC
  Administered 2020-10-03: 2 [drp] via OPHTHALMIC
  Filled 2020-10-03: qty 4

## 2020-10-03 MED ORDER — TETANUS-DIPHTH-ACELL PERTUSSIS 5-2.5-18.5 LF-MCG/0.5 IM SUSY
0.5000 mL | PREFILLED_SYRINGE | Freq: Once | INTRAMUSCULAR | Status: AC
  Administered 2020-10-03: 0.5 mL via INTRAMUSCULAR
  Filled 2020-10-03: qty 0.5

## 2020-10-03 MED ORDER — FENTANYL CITRATE (PF) 100 MCG/2ML IJ SOLN
50.0000 ug | Freq: Once | INTRAMUSCULAR | Status: AC
  Administered 2020-10-03: 50 ug via INTRAVENOUS
  Filled 2020-10-03: qty 2

## 2020-10-03 MED ORDER — ONDANSETRON 4 MG PO TBDP
4.0000 mg | ORAL_TABLET | Freq: Once | ORAL | Status: AC
  Administered 2020-10-03: 4 mg via ORAL
  Filled 2020-10-03: qty 1

## 2020-10-03 MED ORDER — OXYCODONE-ACETAMINOPHEN 5-325 MG PO TABS
2.0000 | ORAL_TABLET | Freq: Once | ORAL | Status: AC
  Administered 2020-10-03: 2 via ORAL
  Filled 2020-10-03: qty 2

## 2020-10-03 NOTE — Discharge Instructions (Addendum)
DO NOT BLOW YOUR NOSE Keep your head elevated at all times Apply ice to your eye for 20 minutes at a time to help with swelling. Take your prescribed home medications as directed for pain. Call both the eye doctor and the ENT specialist tomorrow morning to schedule appointments this week for further management.  The eye doctor should be able to see you in their clinic tomorrow to evaluate the blurry vision.  The ENT should be able to see you sometime later in the week to follow-up on your fracture. Return to the emergency department if you have difficulty moving your left eye in a certain direction, severe pain with movement of your eye, sudden worsening of your headache or vomiting.  Keep your wounds clean.

## 2020-10-03 NOTE — ED Notes (Signed)
Patient transported to CT 

## 2020-10-03 NOTE — ED Notes (Signed)
Placed a Fall Risk wristband on PT's left wrist

## 2020-10-03 NOTE — ED Provider Notes (Signed)
[facility] EMERGENCY DEPARTMENT Provider Note   CSN: [id number] Arrival date & time: 10/03/20  2001     History Chief Complaint  Patient presents with   Facial Injury    Michele Owens is a 60 y.o. female presenting to the ED via EMS from home after mechanical fall.  States she was walking across her room and she slipped on a grocery bag in her walker and fell striking her left face on the entertainment system.  She had some loss of consciousness with this for unknown amount of time.  She endorses pain to the left eyebrow and eyelid.  Her eyeball is irritated from the blood that went into her eye.  She is not having any neck or back pain.  No nausea or vomiting.  Not on anticoagulation.  Reports history of thrombocytopenia, however per chart review, CBC with done 2 days ago with platelets within normal limits.  She has no other injuries from the fall.  The history is provided by the patient.      Past Medical History:  Diagnosis Date   Anxiety    Arthritis    Asthma    Chronic diastolic CHF (congestive heart failure) (HCC)    COPD (chronic obstructive pulmonary disease) (HCC)    Uses Oxygen at night   Depression    Diabetes mellitus without complication (HCC)    Dyspnea    occ   Family history of thyroid cancer    GERD (gastroesophageal reflux disease)    Gout    Headache    migraines   History of DVT of lower extremity    History of nuclear stress test    Myoview 2/17:  Low risk stress nuclear study with a small, moderate intensity, partially reversible inferior lateral defect consistent with small prior infarct and minimal peri-infarct ischemia; EF 68 with normal wall motion   Hypertension    Pneumonia    Thyroid cancer ([place]) 09/23/2016    Patient Active Problem List   Diagnosis Date Noted   Acute on chronic respiratory failure with hypoxia and hypercapnia ([place]) 04/18/2020   Acute on chronic respiratory failure with hypoxia ([place]) 04/18/2020    Hemoptysis 53/66/4403   Acute metabolic encephalopathy 47/42/5956   Acute encephalopathy 04/18/2020   Chronic diastolic CHF (congestive heart failure) ([place]) 04/18/2020   Sepsis ([place]) 04/18/2020   Morbid obesity with BMI of 50.0-59.9, adult ([place])    Fall at home, initial encounter 06/23/2018   HNP (herniated nucleus pulposus), thoracic 06/23/2018   CHF (congestive heart failure) ([place]) 06/18/2018   Hypertensive disorder 08/28/2017   Obesity 08/28/2017   COPD exacerbation ([place]) 08/04/2017   Acute and chronic respiratory failure with hypercapnia ([place])    Pulmonary HTN ([place])    Genetic testing 02/27/2017   Family history of thyroid cancer    Hypomagnesemia 10/03/2016   Atypical chest pain    HLD (hyperlipidemia) 09/23/2016   Gout 09/23/2016   DVT (deep vein thrombosis) in pregnancy 09/23/2016   Thyroid cancer ([place]) 09/23/2016   Hypocalcemia 09/23/2016   AKI (acute kidney injury) ([place]) 09/23/2016   Acute pulmonary edema (HCC)    Acute hypoxemic respiratory failure ([place])    Ventilator dependent ([place])    Chronic respiratory failure with hypoxia and hypercapnia ([place]) 07/16/2016   CAP (community acquired pneumonia) 01/23/2016   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 01/23/2016   Pulmonary hypertension ([place]) 01/23/2016   Obesity hypoventilation syndrome ([place]) 08/26/2015   Dyslipidemia associated with type 2 diabetes mellitus ([place])  04/24/2015   Leukocytosis 04/24/2015   Controlled type 2 diabetes mellitus without complication, without long-term current use of insulin ([place]) 04/24/2015   Anxiety 04/24/2015   Benign essential HTN 04/24/2015   Normocytic anemia 04/24/2015   OSA (obstructive sleep apnea) 05/10/2012   Tobacco abuse 07/04/2011    Past Surgical History:  Procedure Laterality Date   BUNIONECTOMY Bilateral    CARDIAC CATHETERIZATION N/A 06/23/2015   Procedure: Right/Left Heart Cath and Coronary Angiography;  Surgeon: [name], MD;  Location: [facility];  Service: Cardiovascular;  Laterality: N/A;   COLONOSCOPY WITH PROPOFOL N/A 12/15/2015   Procedure: COLONOSCOPY WITH PROPOFOL;  Surgeon: [name], MD;  Location: [facility];  Service: Endoscopy;  Laterality: N/A;   RIGHT HEART CATH N/A 10/01/2019   Procedure: RIGHT HEART CATH;  Surgeon: [name], MD;  Location: [facility];  Service: Cardiovascular;  Laterality: N/A;   THYROIDECTOMY  09/19/2016   THYROIDECTOMY N/A 09/19/2016   Procedure: TOTAL THYROIDECTOMY;  Surgeon: [name], MD;  Location: [facility];  Service: General;  Laterality: N/A;   TONSILLECTOMY     TOTAL ABDOMINAL HYSTERECTOMY  07/14/10     OB History   No obstetric history on file.     Family History  Problem Relation Age of Onset   Cancer Father        thought to be due to exposure to concrete   Diabetes Mother    Thyroid cancer Mother        dx in her 28s-60s   Cancer Maternal Uncle        2 uncles with cancer NOS   Brain cancer Paternal Aunt    Cancer Cousin        maternal first cousin - NOS   Cancer Cousin        maternal first cousin - NOS    Social History   Tobacco Use   Smoking status: Former    Packs/day: 0.50    Years: 41.00    Pack years: 20.50    Types: Cigarettes    Quit date: 07/2016    Years since quitting: 4.2   Smokeless tobacco: Never   Tobacco comments:    Starting to [employer] -- using Nicotine Patch  Vaping Use   Vaping Use: Never used  Substance Use Topics   Alcohol use: Not Currently    Alcohol/week: 0.0 standard drinks    Comment: ? none now   Drug use: Never    Comment: none for long time    Home Medications Prior to Admission medications   Medication Sig Start Date End Date Taking? Authorizing Provider  albuterol (PROVENTIL HFA;VENTOLIN HFA) 108 (90 Base) MCG/ACT inhaler Inhale 1-2 puffs into the lungs every 6 (six) hours as needed for wheezing or shortness of breath. 04/23/18   Long, [name], MD  allopurinol (ZYLOPRIM) 100 MG tablet Take 100 mg by  mouth daily.    [provider]  ANORO ELLIPTA 62.5-25 MCG/INH AEPB Inhale 1 puff into the lungs daily.  04/11/18   [provider]  benzonatate (TESSALON) 200 MG capsule Take 1 capsule (200 mg total) by mouth in the morning, at noon, and at bedtime. 7 DS 04/13/20   Regalado, Jerald Kief A, MD  Blood Glucose Monitoring Suppl (ACCU-CHEK AVIVA PLUS) w/Device KIT 1 each by Other route daily.  08/07/17   [provider]  butalbital-acetaminophen-caffeine (FIORICET) 50-325-40 MG tablet Take 1 tablet by mouth 2 (two) times daily as needed for  migraine. 08/13/20   [provider]  calcitRIOL (ROCALTROL) 0.5 MCG capsule Take 1 capsule (0.5 mcg total) by mouth daily. 09/26/16   Hongalgi, [name], MD  carvedilol (COREG) 6.25 MG tablet Take 1 tablet (6.25 mg total) by mouth 2 (two) times daily with a meal. 01/23/18   Bensimhon, [name], MD  cefdinir (OMNICEF) 300 MG capsule TAKE 1 CAPSULE (300 MG TOTAL) BY MOUTH TWO TIMES DAILY FOR 4 DAYS. Patient not taking: No sig reported 04/20/20 04/20/21  [name], MD  clonazePAM (KLONOPIN) 2 MG tablet Take 1 tablet (2 mg total) by mouth 3 (three) times daily. for anxiety Patient taking differently: Take 2 mg by mouth 3 (three) times daily. 06/23/19   [name], MD  cyclobenzaprine (FLEXERIL) 10 MG tablet Take 10 mg by mouth 3 (three) times daily. 11/19/19   [provider]  diclofenac sodium (VOLTAREN) 1 % GEL Apply 2 g topically 4 (four) times daily as needed (for pain). Patient not taking: No sig reported 06/19/18   [name], MD  EPINEPHrine 0.3 mg/0.3 mL IJ SOAJ injection Inject 0.3 mg into the muscle daily as needed for anaphylaxis.    [provider]  FEROSUL 325 (65 Fe) MG tablet Take 325 mg by mouth daily. 03/22/17   [provider]  glucose blood test strip Use as instructed to check blood sugar up to 4 times daily Patient taking differently: 1 each by Other route See admin instructions. Use as  instructed to check blood sugar up to 4 times daily 08/06/17   [name], DO  guaiFENesin (MUCINEX) 600 MG 12 hr tablet Take 2 tablets (1,200 mg total) by mouth 2 (two) times daily. 04/13/20   Regalado, Belkys A, MD  ipratropium-albuterol (DUONEB) 0.5-2.5 (3) MG/3ML SOLN Take 3 mLs by nebulization 4 (four) times daily.  04/05/17   [provider]  levothyroxine (SYNTHROID, LEVOTHROID) 112 MCG tablet Take 224 mcg by mouth daily. 07/11/17   [provider]  lisinopril (PRINIVIL,ZESTRIL) 5 MG tablet Take 1 tablet (5 mg total) by mouth daily. Please call for office visit [phone] Patient taking differently: Take 5 mg by mouth daily. 08/15/17   [name], MD  loperamide (IMODIUM) 2 MG capsule Take 1 capsule (2 mg total) by mouth 4 (four) times daily as needed for diarrhea or loose stools. 1/96/22   [name], MD  metFORMIN (GLUCOPHAGE) 500 MG tablet Take 1 tablet (500 mg total) by mouth 2 (two) times daily with a meal. 12/11/14   [name], MD  nitroGLYCERIN (NITROSTAT) 0.4 MG SL tablet Place 1 tablet (0.4 mg total) under the tongue every 5 (five) minutes as needed for chest pain. 06/19/18   [name], MD  nystatin cream (MYCOSTATIN) Apply 1 application topically 2 (two) times daily as needed for dry skin. 04/13/20   Regalado, Belkys A, MD  omeprazole (PRILOSEC) 20 MG capsule Take 20 mg by mouth 2 (two) times daily before a meal. 11/19/19   [provider]  oxyCODONE-acetaminophen (PERCOCET) 10-325 MG tablet Take 1 tablet by mouth every 6 (six) hours as needed for pain. 12/03/19   [provider]  OXYGEN Inhale 2-3 L into the lungs continuous.    [provider]  polyethylene glycol (MIRALAX / GLYCOLAX) 17 g packet Take 17 g by mouth daily as needed for mild constipation.    [provider]  potassium chloride SA (K-DUR,KLOR-CON) 20 MEQ tablet Take 1 tablet (20 mEq total) by mouth 2 (two)  times daily. 07/24/18   [name], MD   QC STOOL SOFTENER PLS LAXATIVE 8.6-50 MG tablet Take 1 tablet by mouth at bedtime. 04/11/17   [provider]  rosuvastatin (CRESTOR) 10 MG tablet Take 10 mg by mouth every evening. 11/19/19   [provider]  torsemide (DEMADEX) 20 MG tablet TAKE 4 (FOUR) TABLETS BY MOUTH DAILY (2AM+ 2NOON) Patient taking differently: Take 40 mg by mouth 2 (two) times daily. 05/14/20   [name], MD  traZODone (DESYREL) 50 MG tablet Take 100 mg by mouth at bedtime. 11/19/19   [provider]    Allergies    Bee venom, Fioricet [butalbital-apap-caffeine], Ibuprofen, Lamisil [terbinafine], and Nsaids  Review of Systems   Review of Systems  All other systems reviewed and are negative.  Physical Exam Updated Vital Signs BP 125/69   Pulse 91   Temp 98.7 F (37.1 C) (Oral)   Resp (!) 23   SpO2 98%   Physical Exam Vitals and nursing note reviewed.  Constitutional:      General: She is not in acute distress.    Appearance: She is well-developed.  HENT:     Head: Normocephalic.     Comments: Left forehead and superiormost portion of upper lid/brow with small superficial lacerations that are not actively bleeding.  Wound to the left forehead is less than 0.5 cm in length.  Wound to left lid is very superficial and about 0.5 cm in length.  No palpable scalp hematoma.  No raccoon eyes or battle sign. Eyes:     Comments: Left conjunctiva is injected.  Left eye was visualized under Woods lamp with fluorescein stain, no uptake noted.  No Seidel sign.  No obvious hyphema. No evidence of entrapment.  EOMs are intact.  Pupils are equal round and reactive to light.  Cardiovascular:     Rate and Rhythm: Normal rate and regular rhythm.  Pulmonary:     Effort: Pulmonary effort is normal. No respiratory distress.     Breath sounds: Normal breath sounds.  Abdominal:     Palpations: Abdomen is soft.  Musculoskeletal:     Cervical back: Normal range of motion and neck supple. No  tenderness.     Comments: Spontaneously moving all extremities.  Skin:    General: Skin is warm.  Neurological:     Mental Status: She is alert. Mental status is at baseline.     Comments: Speech is fluent, no facial droop.  Following simple commands without difficulty.  Equal strength to all 4 extremities.  Normal finger-nose bilaterally.  Gait deferred  Psychiatric:        Behavior: Behavior normal.    ED Results / Procedures / Treatments   Labs (all labs ordered are listed, but only abnormal results are displayed) Labs Reviewed  BASIC METABOLIC PANEL - Abnormal; Notable for the following components:      Result Value   Chloride 95 (*)    CO2 33 (*)    Glucose, Bld 125 (*)    Calcium 8.8 (*)    All other components within normal limits  CBC WITH DIFFERENTIAL/PLATELET - Abnormal; Notable for the following components:   WBC 15.8 (*)    Hemoglobin 11.4 (*)    Neutro Abs 12.7 (*)    Abs Immature Granulocytes 0.09 (*)    All other components within normal limits  I-STAT CHEM 8, ED - Abnormal; Notable for the following components:   Chloride 94 (*)    Glucose, Bld  121 (*)    Calcium, Ion 1.11 (*)    TCO2 36 (*)    All other components within normal limits    EKG None  Radiology CT Head Wo Contrast  Addendum Date: 10/03/2020   ADDENDUM REPORT: 10/03/2020 21:40 ADDENDUM: Sinuses/orbits: Left medial orbital wall blowout fracture noted with fluid in the adjacent ethmoid air cells. Electronically Signed   By: Rolm Baptise M.D.   On: 10/03/2020 21:40   Result Date: 10/03/2020 CLINICAL DATA:  Fall EXAM: CT HEAD WITHOUT CONTRAST TECHNIQUE: Contiguous axial images were obtained from the base of the skull through the vertex without intravenous contrast. COMPARISON:  04/18/2020 FINDINGS: Brain: No acute intracranial abnormality. Specifically, no hemorrhage, hydrocephalus, mass lesion, acute infarction, or significant intracranial injury. Vascular: No hyperdense vessel or unexpected  calcification. Skull: No acute calvarial abnormality. Sinuses/Orbits: No acute findings Other: Soft tissue swelling in the left forehead. IMPRESSION: No acute intracranial abnormality. Electronically Signed: By: Rolm Baptise M.D. On: 10/03/2020 21:35   CT Cervical Spine Wo Contrast  Result Date: 10/03/2020 CLINICAL DATA:  Fall EXAM: CT CERVICAL SPINE WITHOUT CONTRAST TECHNIQUE: Multidetector CT imaging of the cervical spine was performed without intravenous contrast. Multiplanar CT image reconstructions were also generated. COMPARISON:  None. FINDINGS: Alignment: Normal Skull base and vertebrae: No acute fracture. No primary bone lesion or focal pathologic process. Soft tissues and spinal canal: No prevertebral fluid or swelling. No visible canal hematoma. Disc levels:  Maintained Upper chest: No acute findings Other: None IMPRESSION: No acute bony abnormality. Electronically Signed   By: Rolm Baptise M.D.   On: 10/03/2020 21:37   CT Maxillofacial WO CM  Result Date: 10/03/2020 CLINICAL DATA:  Fall EXAM: CT MAXILLOFACIAL WITHOUT CONTRAST TECHNIQUE: Multidetector CT imaging of the maxillofacial structures was performed. Multiplanar CT image reconstructions were also generated. COMPARISON:  Head CT 04/18/2020 FINDINGS: Osseous: Zygomatic arches and mandible intact.  No facial fracture. Orbits: There is a left medial orbital wall blowout fracture. No other orbital fracture. Globes are intact. Sinuses: Fluid within the left ethmoid air cells associated with the left medial orbital wall blowout fracture. Soft tissues: Soft tissue swelling over the left orbit and forehead. Limited intracranial: See head CT report. IMPRESSION: Left medial orbital wall blowout fracture. Electronically Signed   By: Rolm Baptise M.D.   On: 10/03/2020 21:39    Procedures Procedures   Medications Ordered in ED Medications  Tdap (BOOSTRIX) injection 0.5 mL (0.5 mLs Intramuscular Given 10/03/20 2100)  oxyCODONE-acetaminophen  (PERCOCET/ROXICET) 5-325 MG per tablet 2 tablet (2 tablets Oral Given 10/03/20 2056)  ondansetron (ZOFRAN-ODT) disintegrating tablet 4 mg (4 mg Oral Given 10/03/20 2059)  tetracaine (PONTOCAINE) 0.5 % ophthalmic solution 2 drop (2 drops Left Eye Given by Other 10/03/20 2303)  fluorescein ophthalmic strip 1 strip (1 strip Left Eye Given by Other 10/03/20 2303)  fentaNYL (SUBLIMAZE) injection 50 mcg (50 mcg Intravenous Given 10/03/20 2300)    ED Course  I have reviewed the triage vital signs and the nursing notes.  Pertinent labs & imaging results that were available during my care of the patient were reviewed by me and considered in my medical decision making (see chart for details).  Clinical Course as of 10/04/20 0009  Sun Oct 03, 2020  2130 Discussed patient's blurry vision in left eye with ophthalmology Dr. Stacie Glaze.  Suggests possibility of traumatic iritis though will need to evaluate further in the clinic tomorrow.  No current treatments recommended.  Patient instructed to call office in the morning for  appointment tomorrow for further evaluation.  Appreciate consultation and assistance patient [JR]  2314 Left eye visualized under woods lamp with fluorescein stain. No uptake noted.  [JR]  2322 Consulted with Dr. Fredric Dine on call for maxillo facial trauma.she was able to review CT images and agrees patient is appropriate for outpatient management.  She will follow in clinic this week.  Recommends patient sleep with head of bed elevated, no nose blowing, frequent ice.  Patient to call in morning for appointment.  Appreciate consultation. [JR]    Clinical Course User Index [JR] Quatisha Zylka, [place] N, PA-C   MDM Rules/Calculators/A&P                          Patient is presenting for evaluation of left facial trauma after she had mechanical fall with brief LOC.  She has small superficial wound to left forehead and brow that do not require repair.  Tdap is updated.  She however is having quite a bit  of pain to the left eye with blurry vision.    No entrapment.  No fluorescein uptake or obvious Seidel sign.  Pupils are equal and round, reactive to light.  CT imaging however reveals blowout fracture to left medial orbital wall.  Consults were placed to both ENT and ophthalmology.  Both recommend outpatient follow-up.  Ophthalmology will see patient tomorrow in clinic and evaluate for any traumatic iritis given patient's blurry vision to left eye.  ENT suggest this may not need any surgical intervention though ENT will follow this week and determine plan.  Patient is instructed of precautions including no nose blowing, sleeping with head elevated, frequent ice, and very strict return precautions.  Her symptoms are treated here in the ED with improvement.  She is amenable to this care plan and agreeable with discharge.  Concussion precautions also discussed.  Discussed results, findings, treatment and follow up. Patient advised of return precautions. Patient verbalized understanding and agreed with plan.  Final Clinical Impression(s) / ED Diagnoses Final diagnoses:  Fracture of medial orbital wall, left side, initial encounter for closed fracture [facility])  Fall in home, initial encounter    Rx / DC Orders ED Discharge Orders     None        Ioannis Schuh, [place] N, PA-C 10/04/20 0009    Charlesetta Shanks, MD 10/08/20 1544

## 2020-10-03 NOTE — ED Triage Notes (Addendum)
Pt bib EMS from home. Pt was walking with her cane and slipped/fell, hitting her head on a table. Laceration noted to L eyebrow, L eye swollen shut. No LOC. Hx of thrombocytopenia, not on blood thinners. C/o headache and L eye pain. A&O x4  EMS vitals: 158/80 94% 3L [place], baseline RR 20 135 CBG

## 2020-10-04 ENCOUNTER — Other Ambulatory Visit: Payer: Medicaid Other | Admitting: Obstetrics and Gynecology

## 2020-10-04 ENCOUNTER — Encounter (HOSPITAL_COMMUNITY): Payer: Self-pay | Admitting: Emergency Medicine

## 2020-10-04 ENCOUNTER — Emergency Department (HOSPITAL_COMMUNITY)
Admission: EM | Admit: 2020-10-04 | Discharge: 2020-10-05 | Disposition: A | Discharge status: Home / Self Care | Payer: Medicaid Other | Source: Home / Self Care | Attending: Emergency Medicine | Admitting: Emergency Medicine

## 2020-10-04 DIAGNOSIS — R58 Hemorrhage, not elsewhere classified: Secondary | ICD-10-CM | POA: Diagnosis not present

## 2020-10-04 DIAGNOSIS — H5712 Ocular pain, left eye: Secondary | ICD-10-CM

## 2020-10-04 DIAGNOSIS — S0285XD Fracture of orbit, unspecified, subsequent encounter for fracture with routine healing: Secondary | ICD-10-CM

## 2020-10-04 DIAGNOSIS — I110 Hypertensive heart disease with heart failure: Secondary | ICD-10-CM | POA: Insufficient documentation

## 2020-10-04 DIAGNOSIS — I5032 Chronic diastolic (congestive) heart failure: Secondary | ICD-10-CM | POA: Insufficient documentation

## 2020-10-04 DIAGNOSIS — J449 Chronic obstructive pulmonary disease, unspecified: Secondary | ICD-10-CM | POA: Insufficient documentation

## 2020-10-04 DIAGNOSIS — S0232XA Fracture of orbital floor, left side, initial encounter for closed fracture: Secondary | ICD-10-CM | POA: Diagnosis not present

## 2020-10-04 DIAGNOSIS — G4489 Other headache syndrome: Secondary | ICD-10-CM | POA: Diagnosis not present

## 2020-10-04 DIAGNOSIS — E119 Type 2 diabetes mellitus without complications: Secondary | ICD-10-CM | POA: Insufficient documentation

## 2020-10-04 DIAGNOSIS — Z87891 Personal history of nicotine dependence: Secondary | ICD-10-CM | POA: Insufficient documentation

## 2020-10-04 DIAGNOSIS — Z7984 Long term (current) use of oral hypoglycemic drugs: Secondary | ICD-10-CM | POA: Insufficient documentation

## 2020-10-04 DIAGNOSIS — J45909 Unspecified asthma, uncomplicated: Secondary | ICD-10-CM | POA: Insufficient documentation

## 2020-10-04 DIAGNOSIS — R0902 Hypoxemia: Secondary | ICD-10-CM | POA: Diagnosis not present

## 2020-10-04 DIAGNOSIS — H579 Unspecified disorder of eye and adnexa: Secondary | ICD-10-CM | POA: Diagnosis not present

## 2020-10-04 DIAGNOSIS — W01198D Fall on same level from slipping, tripping and stumbling with subsequent striking against other object, subsequent encounter: Secondary | ICD-10-CM | POA: Insufficient documentation

## 2020-10-04 DIAGNOSIS — S02832D Fracture of medial orbital wall, left side, subsequent encounter for fracture with routine healing: Secondary | ICD-10-CM | POA: Insufficient documentation

## 2020-10-04 DIAGNOSIS — Z79899 Other long term (current) drug therapy: Secondary | ICD-10-CM | POA: Insufficient documentation

## 2020-10-04 DIAGNOSIS — Z743 Need for continuous supervision: Secondary | ICD-10-CM | POA: Diagnosis not present

## 2020-10-04 DIAGNOSIS — Z85850 Personal history of malignant neoplasm of thyroid: Secondary | ICD-10-CM | POA: Insufficient documentation

## 2020-10-04 MED ORDER — DIPHENHYDRAMINE HCL 50 MG/ML IJ SOLN
12.5000 mg | Freq: Once | INTRAMUSCULAR | Status: AC
  Administered 2020-10-04: 12.5 mg via INTRAVENOUS
  Filled 2020-10-04: qty 1

## 2020-10-04 MED ORDER — AFRIN NASAL SPRAY 0.05 % NA SOLN: 1.0000 | Freq: Two times a day (BID) | NASAL | 0 refills | Status: DC

## 2020-10-04 MED ORDER — CLONAZEPAM 0.5 MG PO TABS
0.5000 mg | ORAL_TABLET | Freq: Once | ORAL | Status: AC
  Administered 2020-10-04: 0.5 mg via ORAL
  Filled 2020-10-04: qty 1

## 2020-10-04 MED ORDER — PROCHLORPERAZINE EDISYLATE 10 MG/2ML IJ SOLN
10.0000 mg | Freq: Once | INTRAMUSCULAR | Status: AC
  Administered 2020-10-04: 10 mg via INTRAVENOUS
  Filled 2020-10-04: qty 2

## 2020-10-04 MED ORDER — CLONAZEPAM 0.5 MG PO TABS
2.0000 mg | ORAL_TABLET | Freq: Once | ORAL | Status: AC
  Administered 2020-10-04: 1.5 mg via ORAL
  Filled 2020-10-04: qty 4

## 2020-10-04 NOTE — ED Notes (Signed)
PTAR called, 5th one down

## 2020-10-04 NOTE — ED Notes (Addendum)
Pt asked this NT to get her some medication that is prescribed to her at home. Explained to pt that medication has to be ordered by the doctor when she gets to a room to be examined. Pt then proceeded to demand that I call the doctor to tell them to have everything ready for her when she gets back to a room. This NT explained to pt that I am unable to do that. Pt became agitated and began yelling at this Probation officer. Pt stated that "You need to tell them or I am going to act up" and stated that "you will see" when I explained to her again that I am unable to tell the doctor what to order.

## 2020-10-04 NOTE — Discharge Instructions (Addendum)
I have provided 2 separate eye doctors information listed on this handout.  You need to call one of the eye doctors and schedule an appointment for follow-up of the fracture of your orbit.  Please continue to place ice on the left eye every 4 hours.  This will help with pain and swelling. I am also prescribing you Afrin which is a nasal decongestion that you can use as needed for nasal congestion.  Do not blow your nose.

## 2020-10-04 NOTE — ED Notes (Signed)
Pt has been yelling and talking exteremly loud and complaining since EMS wheeled her out here. I came over to pt and asked her what is the problem. Pt stated very lout she wants a recliner and to know when she is going to get back. I asked pt to please not scream at me and lets work one thing at a time. I told her I can get her a recliner. Then that she is going to half to wait until a room is available in the back to be seen.   Darrel went and got pt a recliner.   Pt is now resting after yelling at other NT in lobby about wanting to get pain meds and to be seen. I did over hear pt state that she is going to act up and she going to act up real soon pt stated you will see.   Pt ended up falling asleep after comment.

## 2020-10-04 NOTE — ED Triage Notes (Signed)
Pt back to the ED today with c/o pain from a fall yesterday where she was treated here and discharged home

## 2020-10-04 NOTE — ED Notes (Addendum)
Pt yelling out in the lobby. Pt states that she needs to go back now. This NT assured pt that we are doing our best to get pts seen as quickly as possible and that we will get her back to a room as soon as we can. Pt yelled again that she needs to be seen now. Pt demanded for her legs of the recliner to be let down and this Probation officer assisted pt with the recliner. Pt stopping Facilities manager by yelling out across the lobby to voice the same concerns.

## 2020-10-04 NOTE — ED Notes (Signed)
Pt stopped EMT Darrell to voice concerns about her eye pain and needing to be be seen now. Pt stating "I am going to turn out and start breaking some shit" EMT Darrell provided pt with ice pack and updated pt on wait time.

## 2020-10-04 NOTE — ED Provider Notes (Signed)
[facility] EMERGENCY DEPARTMENT Provider Note   CSN: [id number] Arrival date & time: 10/04/20  1420     History No chief complaint on file.   Michele Owens is a 60 y.o. female.  HPI Patient is a 60 year old female with multiple comorbidities listed below who presents the emergency department with left eye pain after a mechanical ground-level fall from standing yesterday in which she struck the left side of her face on furniture.  She was evaluated in the emergency department and found to have a fracture of the medial orbital wall on the left side without any evidence of entrapment or open globe.  ED staff spoke with ophthalmology as well as ENT who recommended ice, pain control, no nose blowing and outpatient follow-up. Patient tells me that she has not yet called to schedule an appointment with ophthalmology.  She prefers to see her doctor that gives her her glasses. Her vision has improved since yesterday.  She reports no pain with eye movements.  She has had some congestion of her nose but has not been blowing her nose.  Has not used Afrin.  No new weakness, numbness, dizziness, lightheadedness or syncope.  No additional trauma or falls or head injury since discharge from the ED yesterday.     Past Medical History:  Diagnosis Date   Anxiety    Arthritis    Asthma    Chronic diastolic CHF (congestive heart failure) (HCC)    COPD (chronic obstructive pulmonary disease) (HCC)    Uses Oxygen at night   Depression    Diabetes mellitus without complication (HCC)    Dyspnea    occ   Family history of thyroid cancer    GERD (gastroesophageal reflux disease)    Gout    Headache    migraines   History of DVT of lower extremity    History of nuclear stress test    Myoview 2/17:  Low risk stress nuclear study with a small, moderate intensity, partially reversible inferior lateral defect consistent with small prior infarct and minimal peri-infarct ischemia; EF 68  with normal wall motion   Hypertension    Pneumonia    Thyroid cancer ([place]) 09/23/2016    Patient Active Problem List   Diagnosis Date Noted   Acute on chronic respiratory failure with hypoxia and hypercapnia ([place]) 04/18/2020   Acute on chronic respiratory failure with hypoxia ([place]) 04/18/2020   Hemoptysis 31/49/7026   Acute metabolic encephalopathy 37/85/8850   Acute encephalopathy 04/18/2020   Chronic diastolic CHF (congestive heart failure) ([place]) 04/18/2020   Sepsis ([place]) 04/18/2020   Morbid obesity with BMI of 50.0-59.9, adult ([place])    Fall at home, initial encounter 06/23/2018   HNP (herniated nucleus pulposus), thoracic 06/23/2018   CHF (congestive heart failure) ([place]) 06/18/2018   Hypertensive disorder 08/28/2017   Obesity 08/28/2017   COPD exacerbation ([place]) 08/04/2017   Acute and chronic respiratory failure with hypercapnia ([place])    Pulmonary HTN ([place])    Genetic testing 02/27/2017   Family history of thyroid cancer    Hypomagnesemia 10/03/2016   Atypical chest pain    HLD (hyperlipidemia) 09/23/2016   Gout 09/23/2016   DVT (deep vein thrombosis) in pregnancy 09/23/2016   Thyroid cancer ([place]) 09/23/2016   Hypocalcemia 09/23/2016   AKI (acute kidney injury) ([place]) 09/23/2016   Acute pulmonary edema (HCC)    Acute hypoxemic respiratory failure (HCC)    Ventilator dependent (HCC)    Chronic respiratory failure with hypoxia and hypercapnia ([place])  07/16/2016   CAP (community acquired pneumonia) 01/23/2016   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 01/23/2016   Pulmonary hypertension ([place]) 01/23/2016   Obesity hypoventilation syndrome ([place]) 08/26/2015   Dyslipidemia associated with type 2 diabetes mellitus ([place]) 04/24/2015   Leukocytosis 04/24/2015   Controlled type 2 diabetes mellitus without complication, without long-term current use of insulin ([place]) 04/24/2015   Anxiety 04/24/2015   Benign essential HTN 04/24/2015   Normocytic anemia 04/24/2015   OSA  (obstructive sleep apnea) 05/10/2012   Tobacco abuse 07/04/2011    Past Surgical History:  Procedure Laterality Date   BUNIONECTOMY Bilateral    CARDIAC CATHETERIZATION N/A 06/23/2015   Procedure: Right/Left Heart Cath and Coronary Angiography;  Surgeon: [name], MD;  Location: [facility];  Service: Cardiovascular;  Laterality: N/A;   COLONOSCOPY WITH PROPOFOL N/A 12/15/2015   Procedure: COLONOSCOPY WITH PROPOFOL;  Surgeon: [name], MD;  Location: [facility];  Service: Endoscopy;  Laterality: N/A;   RIGHT HEART CATH N/A 10/01/2019   Procedure: RIGHT HEART CATH;  Surgeon: [name], MD;  Location: [facility];  Service: Cardiovascular;  Laterality: N/A;   THYROIDECTOMY  09/19/2016   THYROIDECTOMY N/A 09/19/2016   Procedure: TOTAL THYROIDECTOMY;  Surgeon: [name], MD;  Location: [facility];  Service: General;  Laterality: N/A;   TONSILLECTOMY     TOTAL ABDOMINAL HYSTERECTOMY  07/14/10     OB History   No obstetric history on file.     Family History  Problem Relation Age of Onset   Cancer Father        thought to be due to exposure to concrete   Diabetes Mother    Thyroid cancer Mother        dx in her 63s-60s   Cancer Maternal Uncle        2 uncles with cancer NOS   Brain cancer Paternal Aunt    Cancer Cousin        maternal first cousin - NOS   Cancer Cousin        maternal first cousin - NOS    Social History   Tobacco Use   Smoking status: Former    Packs/day: 0.50    Years: 41.00    Pack years: 20.50    Types: Cigarettes    Quit date: 07/2016    Years since quitting: 4.2   Smokeless tobacco: Never   Tobacco comments:    Starting to [employer] -- using Nicotine Patch  Vaping Use   Vaping Use: Never used  Substance Use Topics   Alcohol use: Not Currently    Alcohol/week: 0.0 standard drinks    Comment: ? none now   Drug use: Never    Comment: none for long time    Home Medications Prior to Admission medications   Medication  Sig Start Date End Date Taking? Authorizing Provider  oxymetazoline (AFRIN NASAL SPRAY) 0.05 % nasal spray Place 1 spray into both nostrils 2 (two) times daily. 10/04/20  Yes Pearson Grippe, DO  albuterol (PROVENTIL HFA;VENTOLIN HFA) 108 (90 Base) MCG/ACT inhaler Inhale 1-2 puffs into the lungs every 6 (six) hours as needed for wheezing or shortness of breath. 04/23/18   Long, [name], MD  allopurinol (ZYLOPRIM) 100 MG tablet Take 100 mg by mouth daily.    [provider]  ANORO ELLIPTA 62.5-25 MCG/INH AEPB Inhale 1 puff into the lungs daily.  04/11/18   [provider]  benzonatate (TESSALON) 200 MG capsule  Take 1 capsule (200 mg total) by mouth in the morning, at noon, and at bedtime. 7 DS 04/13/20   Regalado, Jerald Kief A, MD  Blood Glucose Monitoring Suppl (ACCU-CHEK AVIVA PLUS) w/Device KIT 1 each by Other route daily.  08/07/17   [provider]  butalbital-acetaminophen-caffeine (FIORICET) 50-325-40 MG tablet Take 1 tablet by mouth 2 (two) times daily as needed for migraine. 08/13/20   [provider]  calcitRIOL (ROCALTROL) 0.5 MCG capsule Take 1 capsule (0.5 mcg total) by mouth daily. 09/26/16   Hongalgi, [name], MD  carvedilol (COREG) 6.25 MG tablet Take 1 tablet (6.25 mg total) by mouth 2 (two) times daily with a meal. 01/23/18   Bensimhon, [name], MD  cefdinir (OMNICEF) 300 MG capsule TAKE 1 CAPSULE (300 MG TOTAL) BY MOUTH TWO TIMES DAILY FOR 4 DAYS. Patient not taking: No sig reported 04/20/20 04/20/21  [name], MD  clonazePAM (KLONOPIN) 2 MG tablet Take 1 tablet (2 mg total) by mouth 3 (three) times daily. for anxiety Patient taking differently: Take 2 mg by mouth 3 (three) times daily. 06/23/19   [name], MD  cyclobenzaprine (FLEXERIL) 10 MG tablet Take 10 mg by mouth 3 (three) times daily. 11/19/19   [provider]  diclofenac sodium (VOLTAREN) 1 % GEL Apply 2 g topically 4 (four) times daily as needed (for pain). Patient not taking: No  sig reported 06/19/18   [name], MD  EPINEPHrine 0.3 mg/0.3 mL IJ SOAJ injection Inject 0.3 mg into the muscle daily as needed for anaphylaxis.    [provider]  FEROSUL 325 (65 Fe) MG tablet Take 325 mg by mouth daily. 03/22/17   [provider]  glucose blood test strip Use as instructed to check blood sugar up to 4 times daily Patient taking differently: 1 each by Other route See admin instructions. Use as instructed to check blood sugar up to 4 times daily 08/06/17   [name], DO  guaiFENesin (MUCINEX) 600 MG 12 hr tablet Take 2 tablets (1,200 mg total) by mouth 2 (two) times daily. 04/13/20   Regalado, Belkys A, MD  ipratropium-albuterol (DUONEB) 0.5-2.5 (3) MG/3ML SOLN Take 3 mLs by nebulization 4 (four) times daily.  04/05/17   [provider]  levothyroxine (SYNTHROID, LEVOTHROID) 112 MCG tablet Take 224 mcg by mouth daily. 07/11/17   [provider]  lisinopril (PRINIVIL,ZESTRIL) 5 MG tablet Take 1 tablet (5 mg total) by mouth daily. Please call for office visit [phone] Patient taking differently: Take 5 mg by mouth daily. 08/15/17   [name], MD  loperamide (IMODIUM) 2 MG capsule Take 1 capsule (2 mg total) by mouth 4 (four) times daily as needed for diarrhea or loose stools. 04/11/70   [name], MD  metFORMIN (GLUCOPHAGE) 500 MG tablet Take 1 tablet (500 mg total) by mouth 2 (two) times daily with a meal. 12/11/14   [name], MD  nitroGLYCERIN (NITROSTAT) 0.4 MG SL tablet Place 1 tablet (0.4 mg total) under the tongue every 5 (five) minutes as needed for chest pain. 06/19/18   [name], MD  nystatin cream (MYCOSTATIN) Apply 1 application topically 2 (two) times daily as needed for dry skin. 04/13/20   Regalado, Belkys A, MD  omeprazole (PRILOSEC) 20 MG capsule Take 20 mg by mouth 2 (two) times daily before a meal. 11/19/19   [provider]  oxyCODONE-acetaminophen (PERCOCET) 10-325 MG tablet Take 1 tablet by  mouth every 6 (six) hours as needed  for pain. 12/03/19   [provider]  OXYGEN Inhale 2-3 L into the lungs continuous.    [provider]  polyethylene glycol (MIRALAX / GLYCOLAX) 17 g packet Take 17 g by mouth daily as needed for mild constipation.    [provider]  potassium chloride SA (K-DUR,KLOR-CON) 20 MEQ tablet Take 1 tablet (20 mEq total) by mouth 2 (two) times daily. 07/24/18   [name], MD  QC STOOL SOFTENER PLS LAXATIVE 8.6-50 MG tablet Take 1 tablet by mouth at bedtime. 04/11/17   [provider]  rosuvastatin (CRESTOR) 10 MG tablet Take 10 mg by mouth every evening. 11/19/19   [provider]  torsemide (DEMADEX) 20 MG tablet TAKE 4 (FOUR) TABLETS BY MOUTH DAILY (2AM+ 2NOON) Patient taking differently: Take 40 mg by mouth 2 (two) times daily. 05/14/20   [name], MD  traZODone (DESYREL) 50 MG tablet Take 100 mg by mouth at bedtime. 11/19/19   [provider]    Allergies    Bee venom, Fioricet [butalbital-apap-caffeine], Ibuprofen, Lamisil [terbinafine], and Nsaids  Review of Systems   Review of Systems  Constitutional:  Negative for chills and fever.  HENT:  Negative for ear pain and sore throat.   Eyes:  Positive for pain and redness. Negative for visual disturbance.  Respiratory:  Negative for cough and shortness of breath.   Cardiovascular:  Negative for chest pain and palpitations.  Gastrointestinal:  Negative for abdominal pain and vomiting.  Genitourinary:  Negative for dysuria and hematuria.  Musculoskeletal:  Negative for arthralgias and back pain.  Skin:  Negative for color change and rash.  Neurological:  Negative for seizures and syncope.  All other systems reviewed and are negative.  Physical Exam Updated Vital Signs BP 127/64 (BP Location: Left Arm)   Pulse (!) 101   Resp (!) 22   SpO2 98%   Physical Exam Vitals and nursing note reviewed.  Constitutional:      General: She is not in  acute distress.    Appearance: She is obese. She is ill-appearing (chronically).  HENT:     Head: Normocephalic.  Eyes:     General: No visual field deficit.    Extraocular Movements: Extraocular movements intact.     Conjunctiva/sclera: Conjunctivae normal.     Comments: Mild upper and lower left eyelid ecchymosis with some mild edema.  She has full extraocular movements without pain.  There is left subconjunctival hemorrhage of approximately 40% mostly inferiorly.  No proptosis.  No significant tenderness around the orbit.  There is a very superficial, small abrasion above the left eyebrow that is hemostatic and appears to be healing well.  Cardiovascular:     Rate and Rhythm: Normal rate and regular rhythm.     Heart sounds: No murmur heard. Pulmonary:     Effort: Pulmonary effort is normal. No respiratory distress.     Breath sounds: Normal breath sounds.  Abdominal:     Palpations: Abdomen is soft.     Tenderness: There is no abdominal tenderness.  Musculoskeletal:     Cervical back: Neck supple.  Skin:    General: Skin is warm and dry.  Neurological:     General: No focal deficit present.     Mental Status: She is alert and oriented to person, place, and time.     GCS: GCS eye subscore is 4. GCS verbal subscore is 5. GCS motor subscore is 6.     Cranial Nerves: Cranial nerves are  intact.     Sensory: Sensation is intact.     Motor: Motor function is intact.    ED Results / Procedures / Treatments   Labs (all labs ordered are listed, but only abnormal results are displayed) Labs Reviewed - No data to display  EKG None  Radiology CT Head Wo Contrast  Addendum Date: 10/03/2020   ADDENDUM REPORT: 10/03/2020 21:40 ADDENDUM: Sinuses/orbits: Left medial orbital wall blowout fracture noted with fluid in the adjacent ethmoid air cells. Electronically Signed   By: Rolm Baptise M.D.   On: 10/03/2020 21:40   Result Date: 10/03/2020 CLINICAL DATA:  Fall EXAM: CT HEAD WITHOUT  CONTRAST TECHNIQUE: Contiguous axial images were obtained from the base of the skull through the vertex without intravenous contrast. COMPARISON:  04/18/2020 FINDINGS: Brain: No acute intracranial abnormality. Specifically, no hemorrhage, hydrocephalus, mass lesion, acute infarction, or significant intracranial injury. Vascular: No hyperdense vessel or unexpected calcification. Skull: No acute calvarial abnormality. Sinuses/Orbits: No acute findings Other: Soft tissue swelling in the left forehead. IMPRESSION: No acute intracranial abnormality. Electronically Signed: By: Rolm Baptise M.D. On: 10/03/2020 21:35   CT Cervical Spine Wo Contrast  Result Date: 10/03/2020 CLINICAL DATA:  Fall EXAM: CT CERVICAL SPINE WITHOUT CONTRAST TECHNIQUE: Multidetector CT imaging of the cervical spine was performed without intravenous contrast. Multiplanar CT image reconstructions were also generated. COMPARISON:  None. FINDINGS: Alignment: Normal Skull base and vertebrae: No acute fracture. No primary bone lesion or focal pathologic process. Soft tissues and spinal canal: No prevertebral fluid or swelling. No visible canal hematoma. Disc levels:  Maintained Upper chest: No acute findings Other: None IMPRESSION: No acute bony abnormality. Electronically Signed   By: Rolm Baptise M.D.   On: 10/03/2020 21:37   CT Maxillofacial WO CM  Result Date: 10/03/2020 CLINICAL DATA:  Fall EXAM: CT MAXILLOFACIAL WITHOUT CONTRAST TECHNIQUE: Multidetector CT imaging of the maxillofacial structures was performed. Multiplanar CT image reconstructions were also generated. COMPARISON:  Head CT 04/18/2020 FINDINGS: Osseous: Zygomatic arches and mandible intact.  No facial fracture. Orbits: There is a left medial orbital wall blowout fracture. No other orbital fracture. Globes are intact. Sinuses: Fluid within the left ethmoid air cells associated with the left medial orbital wall blowout fracture. Soft tissues: Soft tissue swelling over the left  orbit and forehead. Limited intracranial: See head CT report. IMPRESSION: Left medial orbital wall blowout fracture. Electronically Signed   By: Rolm Baptise M.D.   On: 10/03/2020 21:39    Procedures Procedures   Medications Ordered in ED Medications  prochlorperazine (COMPAZINE) injection 10 mg (10 mg Intravenous Given 10/04/20 1954)  diphenhydrAMINE (BENADRYL) injection 12.5 mg (12.5 mg Intravenous Given 10/04/20 1954)  clonazePAM (KLONOPIN) tablet 2 mg (1.5 mg Oral Given 10/04/20 1954)  clonazePAM (KLONOPIN) tablet 0.5 mg (0.5 mg Oral Given 10/04/20 2019)    ED Course  I have reviewed the triage vital signs and the nursing notes.  Pertinent labs & imaging results that were available during my care of the patient were reviewed by me and considered in my medical decision making (see chart for details).    MDM Rules/Calculators/A&P                          My impression is ongoing left eye pain from known orbital fracture as diagnosed yesterday in the emergency department. Also complicated by likely migraine which she says that she suffers from the least weekly. Examination of the left eye is reassuring.  I have a low index of suspicion for open globe.  I do not suspect entrapment.  Most likely, patient has not been adhering to discharge instructions.  She wanted to see her optometrist but I explained that she needs to be evaluated by an ophthalmologist. Her pain was treated in the emergency department with IV migraine cocktail.  We discussed the importance of continued intermittent use of ice on the left I, keeping her head elevated especially at night, using Afrin for nasal congestion and not blowing her nose.  Do not feel as though repeat imaging is indicated at this time.  Safe and stable for discharge home with ophthalmology follow-up as previously planned.  Final Clinical Impression(s) / ED Diagnoses Final diagnoses:  Pain of left eye  Closed fracture of orbital wall with  routine healing, subsequent encounter    Rx / DC Orders ED Discharge Orders          Ordered    oxymetazoline (AFRIN NASAL SPRAY) 0.05 % nasal spray  2 times daily        10/04/20 2026             Pearson Grippe, DO 10/04/20 2027    Carmin Muskrat, MD 10/04/20 [phone]

## 2020-10-04 NOTE — ED Notes (Signed)
E-signature pad unavailable at time of pt discharge. This RN discussed discharge materials with pt and answered all pt questions. Pt stated understanding of discharge material. ? ?

## 2020-10-04 NOTE — Patient Outreach (Signed)
Care Coordination  10/04/2020  Michele Owens 1960-10-17 [id number]  RNCM returned patient's phone call.  Patient at hospital to be seen-limited conversation.  Patient wanted to let me know.  Will continue to follow  [name] RN, [facility] Management Coordinator - Managed [place] High Risk [phone].

## 2020-10-04 NOTE — ED Notes (Signed)
Police speaking with patient about outtburst in lobby.

## 2020-10-04 NOTE — ED Notes (Signed)
Ptar called 

## 2020-10-05 ENCOUNTER — Other Ambulatory Visit: Payer: Self-pay | Admitting: Obstetrics and Gynecology

## 2020-10-05 ENCOUNTER — Other Ambulatory Visit: Payer: Self-pay

## 2020-10-05 ENCOUNTER — Telehealth: Payer: Self-pay | Admitting: *Deleted

## 2020-10-05 DIAGNOSIS — Z7689 Persons encountering health services in other specified circumstances: Secondary | ICD-10-CM | POA: Diagnosis not present

## 2020-10-05 NOTE — Patient Outreach (Signed)
Care Coordination  10/05/2020  Celena Lanius 04-Jun-1960 [id number]  RNCM returned patient's phone call.  Patient seen in ED yesterday-needs to be seen by Ophthalmologist per ED note.  Patient states she is calling to make an appointment.  [name] RN, BSN [place]  [facility] Curator - Managed [employer] High Risk [phone].

## 2020-10-05 NOTE — Patient Outreach (Signed)
Medicaid Managed Care Social Work Note  10/05/2020 Name:  Michele Owens MRN:  [id number] DOB:  December 17, 1960  Michele Owens is an 60 y.o. year old female who is Owens primary patient of Michele Reach, MD.  The [facility] Managed Care Coordination team was consulted for assistance with:   eye doctor  Michele Owens was given information about Medicaid Managed CareCoordination services today. Michele Owens agreed to services and verbal consent obtained.  Engaged with patient  for by telephone forinitial visit in response to referral for case management and/or care coordination services.   Assessments/Interventions:  Review of past medical history, allergies, medications, health status, including review of consultants reports, laboratory and other test data, was performed as part of comprehensive evaluation and provision of chronic care management services.  SDOH: (Social Determinant of Health) assessments and interventions performed:  BSW received Owens telephone call from Michele Owens with patient on the line. Patient stated that she went to the ED and her eye was still hurting from Owens fall. Patient stated she has the information for her eye doctor and was getting ready to contact them. Patient stated she has not recieieved an aide since last Thursday.    Advanced Directives Status:  Not addressed in this encounter.  Care Plan                 Allergies  Allergen Reactions   Bee Venom Swelling and Other (See Comments)    "All over my body" (swelling)   Fioricet [Butalbital-Apap-Caffeine] Nausea And Vomiting and Rash   Ibuprofen Rash and Other (See Comments)    Severe rash   Lamisil [Terbinafine] Rash and Other (See Comments)    Pt states this causes her to "feel funny"   Nsaids Other (See Comments)    Per MD's orders     Medications Reviewed Today     Reviewed by Michele Medicus, RN (Registered Nurse) on 10/05/20 at 1125  Med List Status: <None>   Medication Order Taking? Sig Documenting Provider  Last Dose Status Informant  albuterol (PROVENTIL HFA;VENTOLIN HFA) 108 (90 Base) MCG/ACT inhaler [id number] No Inhale 1-2 puffs into the lungs every 6 (six) hours as needed for wheezing or shortness of breath. Michele Fast, MD 08/13/2020 Unknown time Active Self           Med Note Michele Owens Oct 04, 2020  8:43 PM) Michele Owens 05/12/2020 Michele Owens per external Owens records   allopurinol (ZYLOPRIM) 100 MG tablet [id number] No Take 100 mg by mouth daily. [provider] 08/13/2020 Unknown time Active Self           Med Note Michele Owens   Mon Oct 04, 2020  8:30 PM) #30 filled 09/10/2020 Michele Owens per external Owens records   benzonatate (TESSALON) 200 MG capsule [id number] No Take 1 capsule (200 mg total) by mouth in the morning, at Owens, and at bedtime. 7 DS Michele Shiley, MD 08/13/2020 Unknown time Active Self           Med Note Michele Owens   Mon Oct 04, 2020  8:30 PM) #21 filled 09/27/2020 Michele Owens per external Owens records  Blood Glucose Monitoring Suppl (ACCU-CHEK AVIVA PLUS) w/Device KIT [id number] No 1 each by Other route daily.  [provider] unk unk Active Self  butalbital-acetaminophen-caffeine (FIORICET) 50-325-40 MG tablet [id number] No Take 1 tablet by mouth 2 (two) times daily as needed for migraine. [provider] Past Month Unknown time Active Self  Med Note Michele Owens Oct 04, 2020  8:31 PM) #60/30 days filled 09/10/2020 Michele Owens per external Owens records   calcitRIOL (ROCALTROL) 0.5 MCG capsule [id number] No Take 1 capsule (0.5 mcg total) by mouth daily. Michele Jansky, MD 08/13/2020 Unknown time Active Self           Med Note Michele Owens Oct 04, 2020  8:31 PM) #30 filled 09/10/2020 Michele Owens per external Owens records   carvedilol (COREG) 6.25 MG tablet [id number] No Take 1 tablet (6.25 mg total) by mouth 2 (two) times daily with Owens meal. Owens, Michele Pascal, MD  08/13/2020 3 am Active Self           Med Note Michele Owens   Mon Oct 04, 2020  8:31 PM) #60 filled 09/10/2020 Michele Owens per external Owens records   clonazePAM (KLONOPIN) 2 MG tablet [id number] No Take 1 tablet (2 mg total) by mouth 3 (three) times daily. for anxiety  Patient taking differently: Take 2 mg by mouth 3 (three) times daily.   Michele Pence, MD 08/13/2020 Unknown time Active            Med Note Michele Owens Oct 04, 2020  8:25 PM) #90/30 days filled 09/07/2020 Michele Owens per PMP AWARE  cyclobenzaprine (FLEXERIL) 10 MG tablet [id number] No Take 10 mg by mouth 3 (three) times daily. [provider] 08/13/2020 Unknown time Active Self           Med Note Michele Owens   Mon Oct 04, 2020  8:32 PM) #90/30 days filled 09/10/2020 Michele Owens per external Owens records   diclofenac sodium (VOLTAREN) 1 % GEL [id number] No Apply 2 g topically 4 (four) times daily as needed (for pain).  Patient not taking: No sig reported   Michele Morton, MD Not Taking Unknown time Active Self  EPINEPHrine 0.3 mg/0.3 mL IJ SOAJ injection [id number] No Inject 0.3 mg into the muscle once as needed for anaphylaxis. [provider] unk last dose Active Self           Med Note Michele Owens Oct 04, 2020  8:41 PM) LF 07/20/2020 Michele Owens per external Owens records   FEROSUL 325 (65 Fe) MG tablet [id number] No Take 325 mg by mouth daily. [provider] 08/13/2020 Unknown time Active Self           Med Note Michele Owens Oct 04, 2020  8:32 PM) #30 filled 09/10/2020 Michele Owens per external Owens records   glucose blood test strip [id number] No Use as instructed to check blood sugar up to 4 times daily  Patient taking differently: 1 each by Other route See admin instructions. Use as instructed to check blood sugar up to 4 times daily   Michele Phi, DO unk unk Active Self  guaiFENesin (MUCINEX) 600 MG 12 hr tablet  [id number] No Take 2 tablets (1,200 mg total) by mouth 2 (two) times daily. Owens, Michele A, MD 08/13/2020 Unknown time Active Self  ipratropium-albuterol (DUONEB) 0.5-2.5 (3) MG/3ML SOLN [id number] No Take 3 mLs by nebulization 4 (four) times daily.  [provider] 08/12/2020 Unknown time Active Self           Med Note Michele Owens Oct 04, 2020  8:43 PM) LF 05/17/2020 Michele Owens per external Owens records   levothyroxine (SYNTHROID, LEVOTHROID) 112 MCG tablet  [id number] No Take 224 mcg by mouth daily before breakfast. [provider] 08/13/2020 Unknown time Active Self           Med Note Michele Owens   Mon Oct 04, 2020  8:33 PM) #60/30 days filled 09/10/2020 Michele Owens per external Owens records   lisinopril (PRINIVIL,ZESTRIL) 5 MG tablet [id number] No Take 1 tablet (5 mg total) by mouth daily. Please call for office visit [phone]  Patient taking differently: Take 5 mg by mouth daily.   [name], MD 08/13/2020 Unknown time Active            Med Note Michele Owens Oct 04, 2020  8:33 PM) #30 filled 09/10/2020 Michele Owens per external Owens records   loperamide (IMODIUM) 2 MG capsule [id number] No Take 1 capsule (2 mg total) by mouth 4 (four) times daily as needed for diarrhea or loose stools. [name], MD Past Month Unknown time Active Self  metFORMIN (GLUCOPHAGE) 500 MG tablet [id number] No Take 1 tablet (500 mg total) by mouth 2 (two) times daily with Owens meal. [name], MD 08/13/2020 Unknown time Active Self           Med Note Michele Owens   Mon Oct 04, 2020  8:34 PM) #60 filled 09/10/2020 Michele Owens per external Owens records   nitroGLYCERIN (NITROSTAT) 0.4 MG SL tablet [id number] No Place 1 tablet (0.4 mg total) under the tongue every 5 (five) minutes as needed for chest pain. Michele Morton, MD unk last dose Active Self           Med Note Michele Owens Oct 04, 2020  8:43 PM) LF 04/28/2020  Michele Owens per external Owens records   nystatin cream (MYCOSTATIN) [id number] No Apply 1 application topically 2 (two) times daily as needed for dry skin. Michele Shiley, MD Past Week Unknown time Active Self           Med Note Michele Owens Oct 04, 2020  8:42 PM) LF 07/19/2020 Michele Owens per external Owens records   nystatin ointment (MYCOSTATIN) [id number]  Apply 1 application topically 2 (two) times daily. [provider]  Active            Med Note Michele Owens Oct 04, 2020  8:35 PM) LF 09/10/2020 Michele Owens per external Owens records   omeprazole (PRILOSEC) 20 MG capsule [id number] No Take 20 mg by mouth 2 (two) times daily before Owens meal. [provider] 08/13/2020 Unknown time Active Self           Med Note Michele Owens   Mon Oct 04, 2020  8:35 PM) #60 filled 09/10/2020 Michele Owens per external Owens records   oxyCODONE-acetaminophen (PERCOCET) 10-325 MG tablet [id number] No Take 1 tablet by mouth every 6 (six) hours as needed for pain. [provider] 08/13/2020 Unknown time Active Self           Med Note Michele Owens   Mon Oct 04, 2020  8:25 PM) #120/30 days filled 09/10/2020 Michele Owens per PMP AWARE  OXYGEN [id number] No Inhale 2-3 Owens into the lungs continuous. [provider] 08/13/2020 Unknown time Active Self           Med Note Corky Mull   Mon Apr 12, 2020 10:08 AM)    oxymetazoline [facility] NASAL SPRAY) 0.05 % nasal spray [id number]  Place 1 spray  into both nostrils 2 (two) times daily. Pearson Grippe, DO  Active   polyethylene glycol (MIRALAX / GLYCOLAX) 17 g packet [id number] No Take 17 g by mouth daily as needed for mild constipation. [provider] Past Week Unknown time Active Self           Med Note Michele Owens, Sharlette Dense   Mon Oct 04, 2020  8:30 PM) 238 g filled 09/21/2020 Michele Owens per external Owens records   potassium chloride SA (K-DUR,KLOR-CON) 20 MEQ  tablet [id number] No Take 1 tablet (20 mEq total) by mouth 2 (two) times daily. [name], MD 08/13/2020 Unknown time Active Self           Med Note Michele Owens Oct 04, 2020  8:36 PM) #60 filled 09/10/2020 Michele Owens per external Owens records   rosuvastatin (CRESTOR) 10 MG tablet [id number] No Take 10 mg by mouth every evening. [provider] 08/13/2020 Unknown time Active Self           Med Note Michele Owens Oct 04, 2020  8:36 PM) #30 filled 09/10/2020 Michele Owens per external Owens records   senna-docusate (SENNA-PLUS) 8.6-50 MG tablet [id number]  Take 1 tablet by mouth daily. [provider]  Active            Med Note Michele Owens Oct 04, 2020  8:37 PM) #30 filled 09/10/2020 Michele Owens per external Owens records   torsemide (DEMADEX) 20 MG tablet [id number] No TAKE 4 (FOUR) TABLETS BY MOUTH DAILY (2AM+ 2NOON)  Patient taking differently: Take 40 mg by mouth 2 (two) times daily with breakfast and lunch.   [name], MD 08/13/2020 Unknown time Active            Med Note Michele Owens Oct 04, 2020  8:38 PM) #120/30 days filled 09/10/2020 Michele Owens per external Owens records   traZODone (DESYREL) 50 MG tablet [id number] No Take 100 mg by mouth at bedtime. [provider] 08/12/2020 Unknown time Active Self           Med Note Michele Owens Oct 04, 2020  8:39 PM) #60 filled 09/10/2020 Michele Owens per external Owens records   triamcinolone cream (KENALOG) 0.1 % [id number]  Apply 1 application topically 2 (two) times daily. [provider]  Active            Med Note Michele Owens Oct 04, 2020  8:40 PM) LF 09/10/2020 Michele Owens per external Owens records   umeclidinium-vilanterol [facility] ELLIPTA) 62.5-25 MCG/INH AEPB [id number]  Inhale 1 puff into the lungs daily. [provider]  Active            Med Note Michele Owens Oct 04, 2020   8:41 PM) 30DS filled 09/10/2020 Michele Owens per external Owens records             Patient Active Problem List   Diagnosis Date Noted   Acute on chronic respiratory failure with hypoxia and hypercapnia ([place]) 04/18/2020   Acute on chronic respiratory failure with hypoxia ([place]) 04/18/2020   Hemoptysis 76/80/8811   Acute metabolic encephalopathy 06/22/9456   Acute encephalopathy 04/18/2020   Chronic diastolic CHF (congestive heart failure) ([place]) 04/18/2020   Sepsis ([place]) 04/18/2020   Morbid obesity with BMI of 50.0-59.9, adult ([place])    Fall at home, initial encounter 06/23/2018  HNP (herniated nucleus pulposus), thoracic 06/23/2018   CHF (congestive heart failure) ([place]) 06/18/2018   Hypertensive disorder 08/28/2017   Obesity 08/28/2017   COPD exacerbation ([name]) 08/04/2017   Acute and chronic respiratory failure with hypercapnia ([place])    Pulmonary HTN ([place])    Genetic testing 02/27/2017   Family history of thyroid cancer    Hypomagnesemia 10/03/2016   Atypical chest pain    HLD (hyperlipidemia) 09/23/2016   Gout 09/23/2016   DVT (deep vein thrombosis) in pregnancy 09/23/2016   Thyroid cancer ([place]) 09/23/2016   Hypocalcemia 09/23/2016   AKI (acute kidney injury) ([place]) 09/23/2016   Acute pulmonary edema (HCC)    Acute hypoxemic respiratory failure (HCC)    Ventilator dependent (HCC)    Chronic respiratory failure with hypoxia and hypercapnia ([place]) 07/16/2016   CAP (community acquired pneumonia) 01/23/2016   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 01/23/2016   Pulmonary hypertension ([place]) 01/23/2016   Obesity hypoventilation syndrome ([place]) 08/26/2015   Dyslipidemia associated with type 2 diabetes mellitus ([place]) 04/24/2015   Leukocytosis 04/24/2015   Controlled type 2 diabetes mellitus without complication, without long-term current use of insulin ([place]) 04/24/2015   Anxiety 04/24/2015   Benign essential HTN 04/24/2015   Normocytic anemia 04/24/2015   OSA  (obstructive sleep apnea) 05/10/2012   Tobacco abuse 07/04/2011    Conditions to be addressed/monitored per PCP order:   eye doctor  Care Plan : General Plan of Care (Adult)  Updates made by Ethelda Chick since 10/05/2020 12:00 AM     Problem: Barriers to Treatment   Priority: High  Onset Date: 04/23/2020     Long-Range Goal: Barriers to Treatment Identified and Managed   Start Date: 05/03/2020  Expected End Date: 11/30/2020  Recent Progress: On track  Priority: Medium  Note:   CARE PLAN ENTRY Medicaid Managed Care (see longitudinal plan of care for additional care plan information)  Current Barriers:  Transportation barriers-patient utilizing [facility] and [employer] as needed. Limited access to caregiver-patient needs PCS, currently not receiving services she is expecting. Patient choosing not to use aides provided to her-discussed finding another agency with patient.  Nurse Clinical Goal(s):  Over the next 30 days, patient will work with [employer] to address needs related to transportation. Update 05/12/20:  Patient has used [employer] and is very pleased. Over the next 90 days, patient will attend all scheduled medical appointments. RNCM will make referral to SW for assistance with PCS Update 05/20/20:  Patient states she is now receiving PCS from [facility] 3 days Owens week for 4 hours each day. Update 05/27/20: BSW spoke with patient about PCS, she stated someone usually comes in 3 days Owens week for Owens few hours. She stated her aide was off today. BSW contacted [place] and spoke with [facility], she stated that patient has 13 hours per week with 6 days Owens week, however patient chooses to have an aide come out 3 days Owens week so that she can have more hours. The aide assist with most ADL's. Update 06/04/20: Patient stated she is ready to move to [facility]. She has already started the process but wants to save some  money first and she has to wait on her nephew who will drive the uhaul truck. Update 06/24/20: BSW contacted patient to follow up on her moving to [facility], she stated she is still saving money. Patient did cut call short because she was sleeping, patient stated she  was not in need of any resources at this time. Update 08/27/20: Bsw received information from [facility] about patient needing some batteries for her remote. BSW contacted patient to see if any resources were needed. Patient stated she contacted [employer] and they informed her it would cost 40 dollars to replace her remote, patient states she only needs batteries. BSW asked if patient had anyone to call that could bring her some batteries, patient stated no. She states her daughter is mad at her right now and working so she does not talk to her, patient states she has not family to call. BSW asked if her aide could help, patient states she has not had an aide in about Owens week, she had to let her last aide go because she smelled like marijuana and they have not replaced her. BSW contacted patients healthcare agency [facility] at [phone] and left Owens voicemail. Update 09/30/20:  Patient has aides available to her at [facility] but chooses not to use them. Discussion with [facility] and they continue to look for another aide for Ms. Senteno. Discussed with patient about her finding Owens new agency. 10/05/20: BSW received Owens telephone call from [facility] with patient on the line. Patient stated that she went to the ED and her eye was still hurting from Owens fall. Patient stated she has the information for her eye doctor and was getting ready to contact them. Patient stated she has not recieieved an aide since last Thursday.   Interventions: Inter-disciplinary care team collaboration (see longitudinal plan of care) Referred patient to community resources care guide team for assistance with transportation. Provided patient with information  about [facility] and [employer]. Discussed plans with patient for ongoing care management follow up and provided patient with direct contact information for care management team Advised patient to call [place] or [employer]. Collaborated with [employer] and [place] regarding transportation. Assisted patient/caregiver with obtaining information about health plan benefits Collaborated with Social Work regarding PCS. Update 08/30/20:  Patient continues to have issues with PCS-SW is investigating. Update 06/14/20:  Patient continues to utilize [employer], aide, [employer] as needed-has missed appointments due to transportation "no shows"-will continue to follow. Update 08/30/20:  patient has been successful in attending appointments with [facility] and [facility] transportation. Update 09/30/20:  Patient has upcoming appointments with [facility] Pharmacist and SW. Patient Self Care Activities:  Patient will call [place]. Update 06/07/20:  Patient continues to utilize [facility] with success. Update 06/14/20:  Patient has missed appointments due to transportation no shows.  [facility] transportation has been utilized as needed. Update 09/30/20:  Patient with no transportation problems right now-uses [employer] or [place] as needed for appointments with success.        Follow up:  Patient agrees to Care Plan and Follow-up.  Plan: The Managed Medicaid care management team will Owens out to the patient again over the next 14 days.  Date/time of next scheduled Social Work care management/care coordination outreach: 10/22/20  Mickel Fuchs, Arita Miss, [place] Medicaid Team  [phone]

## 2020-10-05 NOTE — Patient Instructions (Signed)
Visit Information  Michele Owens was given information about Medicaid Managed Care team care coordination services as a part of their [employer] Medicaid benefit. Michele Owens verbally consented to engagement with the [facility] Managed Care team.   For questions related to your [facility] health plan, please call: [phone] or go here:[email or web address]  If you would like to schedule transportation through your [facility] plan, please call the following number at least 2 days in advance of your appointment: [phone].  Call the [place] at [phone], at any time, 24 hours a day, 7 days a week. If you are in danger or need immediate medical attention call 911.  Michele Owens - following are the goals we discussed in your visit today:   Goals Addressed   None      Social Worker will follow up in 14 days.   Michele Owens, BSW, [place] Managed Medicaid Team  [phone]   Following is a copy of your plan of care:

## 2020-10-05 NOTE — Telephone Encounter (Signed)
Pharmacy called related to Rx: OTC Afrin not being covered by insurance .Marland KitchenMarland KitchenEDCM clarified with EDP Vanita Panda) to change Rx to: Flonase.

## 2020-10-06 ENCOUNTER — Ambulatory Visit: Payer: Self-pay

## 2020-10-06 ENCOUNTER — Other Ambulatory Visit: Payer: Self-pay

## 2020-10-06 ENCOUNTER — Other Ambulatory Visit: Payer: Self-pay | Admitting: Obstetrics and Gynecology
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
## 2020-10-06 NOTE — Patient Outreach (Signed)
Care Coordination  10/06/2020  Anneta Rounds 1960/05/11 [id number]  RNCM returned patient's phone call and answered questions.  [name] RN, BSN [place]  [facility] - Managed Medicaid High Risk [phone]

## 2020-10-07 ENCOUNTER — Other Ambulatory Visit: Payer: Self-pay

## 2020-10-07 ENCOUNTER — Other Ambulatory Visit: Payer: Medicaid Other | Admitting: Obstetrics and Gynecology

## 2020-10-07 NOTE — Patient Outreach (Signed)
Care Coordination  10/07/2020  Michele Owens [birth year] [id number]  RNCM returned patient's phone call.  Per patient, she has an appointment today to have eye checked.  [name] RN, BSN [place]  [employer]ator - Managed Medicaid High Risk [phone].

## 2020-10-08 ENCOUNTER — Other Ambulatory Visit: Payer: Self-pay | Admitting: Obstetrics and Gynecology

## 2020-10-08 DIAGNOSIS — Z419 Encounter for procedure for purposes other than remedying health state, unspecified: Secondary | ICD-10-CM | POA: Diagnosis not present

## 2020-10-08 NOTE — Patient Outreach (Signed)
Care Coordination  10/08/2020  Michele Owens 09-Apr-1961 [id number]  RNCM returned patient's phone call-no answer, left message.  [name] RN, BSN   [facility] - Managed Medicaid High Risk [phone].

## 2020-10-09 DIAGNOSIS — R42 Dizziness and giddiness: Secondary | ICD-10-CM | POA: Diagnosis not present

## 2020-10-09 DIAGNOSIS — R52 Pain, unspecified: Secondary | ICD-10-CM | POA: Diagnosis not present

## 2020-10-09 DIAGNOSIS — R0902 Hypoxemia: Secondary | ICD-10-CM | POA: Diagnosis not present

## 2020-10-09 DIAGNOSIS — R000 Tachycardia, unspecified: Secondary | ICD-10-CM | POA: Diagnosis not present

## 2020-10-09 DIAGNOSIS — R58 Hemorrhage, not elsewhere classified: Secondary | ICD-10-CM | POA: Diagnosis not present

## 2020-10-13 ENCOUNTER — Ambulatory Visit: Payer: Medicaid Other | Admitting: Podiatrist

## 2020-10-18 ENCOUNTER — Ambulatory Visit: Payer: Self-pay | Admitting: *Deleted

## 2020-10-18 ENCOUNTER — Other Ambulatory Visit: Payer: Self-pay | Admitting: Obstetrics and Gynecology

## 2020-10-18 NOTE — Patient Outreach (Signed)
Care Coordination  10/18/2020  Elouise Divelbiss Jul 15, 1960 [id number]  RNCM returned patient's phone call, no answer, left message.  [name] RN, BSN [place]  [employer] Curator - Managed Medicaid High Risk [phone].

## 2020-10-18 NOTE — Telephone Encounter (Signed)
Pt called the community line and stated she fell down a few days ago and has a sore on her ankle, pt stated she has tried to keep it clean but wanted some further advice if she should go to UC. Please advise  Called patient to advise of sore on ankle. Patient reports she fell on Friday and has been treating ankle sore with peroxide and now alcohol and "ointment' . Patient has Band-Aid to cover area on right ankle. Denies redness or drainage from site. Bottom of right heel noted with fluid per patient. Patient denies fever. C/o difficulty to walk on right foot. Patient is diabetic. Recommended patient to go to UC for evaluation due to she was unable to be seen by PCP. Care advise given. Patient verbalized understanding of care advise and to call back or go to [facility] or ED if symptoms worsen..   Reason for Disposition  [1] Minor skin injury (e,g,. minor cut, scratch, scrape) on foot AND [2] diabetes (diabetes mellitus)  Answer Assessment - Initial Assessment Questions 1. APPEARANCE of INJURY: "What does the injury look like?"      Sore on right ankle 2. SIZE: "How large is the cut?"      Na  3. BLEEDING: "Is it bleeding now?" If Yes, ask: "Is it difficult to stop?"      no 4. LOCATION: "Where is the injury located?"      Right ankle area  5. ONSET: "How long ago did the injury occur?"      [facility] Friday 10/15/20 6. MECHANISM: "Tell me how it happened."      [place]  7. TETANUS: "When was the last tetanus booster?"     na 8. PREGNANCY: "Is there any chance you are pregnant?" "When was your last menstrual period?"     na  Protocols used: Skin Injury-A-AH

## 2020-10-19 ENCOUNTER — Ambulatory Visit (HOSPITAL_COMMUNITY)
Admission: EM | Admit: 2020-10-19 | Discharge: 2020-10-19 | Disposition: A | Discharge status: Other Acute Inpatient Hospital | Payer: Medicaid Other | Attending: Family Medicine | Admitting: Family Medicine

## 2020-10-19 ENCOUNTER — Emergency Department (HOSPITAL_COMMUNITY)
Admission: EM | Admit: 2020-10-19 | Discharge: 2020-10-19 | Disposition: A | Discharge status: Home / Self Care | Payer: Medicaid Other | Attending: Emergency Medicine | Admitting: Emergency Medicine

## 2020-10-19 ENCOUNTER — Other Ambulatory Visit: Payer: Self-pay

## 2020-10-19 ENCOUNTER — Encounter (HOSPITAL_COMMUNITY): Payer: Self-pay | Admitting: Emergency Medicine

## 2020-10-19 ENCOUNTER — Emergency Department (HOSPITAL_COMMUNITY): Payer: Medicaid Other

## 2020-10-19 ENCOUNTER — Encounter (HOSPITAL_COMMUNITY): Payer: Self-pay

## 2020-10-19 DIAGNOSIS — R0789 Other chest pain: Secondary | ICD-10-CM | POA: Diagnosis not present

## 2020-10-19 DIAGNOSIS — M79671 Pain in right foot: Secondary | ICD-10-CM | POA: Insufficient documentation

## 2020-10-19 DIAGNOSIS — M79675 Pain in left toe(s): Secondary | ICD-10-CM | POA: Diagnosis not present

## 2020-10-19 DIAGNOSIS — I5032 Chronic diastolic (congestive) heart failure: Secondary | ICD-10-CM | POA: Diagnosis not present

## 2020-10-19 DIAGNOSIS — R9431 Abnormal electrocardiogram [ECG] [EKG]: Secondary | ICD-10-CM

## 2020-10-19 DIAGNOSIS — J441 Chronic obstructive pulmonary disease with (acute) exacerbation: Secondary | ICD-10-CM | POA: Insufficient documentation

## 2020-10-19 DIAGNOSIS — M79672 Pain in left foot: Secondary | ICD-10-CM | POA: Diagnosis not present

## 2020-10-19 DIAGNOSIS — R079 Chest pain, unspecified: Secondary | ICD-10-CM | POA: Diagnosis not present

## 2020-10-19 DIAGNOSIS — J45909 Unspecified asthma, uncomplicated: Secondary | ICD-10-CM | POA: Diagnosis not present

## 2020-10-19 DIAGNOSIS — Z79899 Other long term (current) drug therapy: Secondary | ICD-10-CM | POA: Insufficient documentation

## 2020-10-19 DIAGNOSIS — Z85850 Personal history of malignant neoplasm of thyroid: Secondary | ICD-10-CM | POA: Insufficient documentation

## 2020-10-19 DIAGNOSIS — I110 Hypertensive heart disease with heart failure: Secondary | ICD-10-CM | POA: Diagnosis not present

## 2020-10-19 DIAGNOSIS — Z7984 Long term (current) use of oral hypoglycemic drugs: Secondary | ICD-10-CM | POA: Diagnosis not present

## 2020-10-19 DIAGNOSIS — I517 Cardiomegaly: Secondary | ICD-10-CM | POA: Diagnosis not present

## 2020-10-19 DIAGNOSIS — Z87891 Personal history of nicotine dependence: Secondary | ICD-10-CM | POA: Diagnosis not present

## 2020-10-19 DIAGNOSIS — J9811 Atelectasis: Secondary | ICD-10-CM | POA: Diagnosis not present

## 2020-10-19 LAB — CBC
HCT: 36.6 % (ref 36.0–46.0)
Hemoglobin: 10.9 g/dL — ABNORMAL LOW (ref 12.0–15.0)
MCH: 28.9 pg (ref 26.0–34.0)
MCHC: 29.8 g/dL — ABNORMAL LOW (ref 30.0–36.0)
MCV: 97.1 fL (ref 80.0–100.0)
Platelets: 297 10*3/uL (ref 150–400)
RBC: 3.77 MIL/uL — ABNORMAL LOW (ref 3.87–5.11)
RDW: 13.7 % (ref 11.5–15.5)
WBC: 15.5 10*3/uL — ABNORMAL HIGH (ref 4.0–10.5)
nRBC: 0 % (ref 0.0–0.2)

## 2020-10-19 LAB — BASIC METABOLIC PANEL
Anion gap: 7 (ref 5–15)
BUN: 7 mg/dL (ref 6–20)
CO2: 37 mmol/L — ABNORMAL HIGH (ref 22–32)
Calcium: 8 mg/dL — ABNORMAL LOW (ref 8.9–10.3)
Chloride: 95 mmol/L — ABNORMAL LOW (ref 98–111)
Creatinine, Ser: 0.57 mg/dL (ref 0.44–1.00)
GFR, Estimated: >60 mL/min (ref >60)
Glucose, Bld: 101 mg/dL — ABNORMAL HIGH (ref 70–99)
Potassium: 4.2 mmol/L (ref 3.5–5.1)
Sodium: 139 mmol/L (ref 135–145)

## 2020-10-19 LAB — TROPONIN I (HIGH SENSITIVITY)
Troponin I (High Sensitivity): 13 ng/L (ref <18)
Troponin I (High Sensitivity): 13 ng/L (ref <18)

## 2020-10-19 MED ORDER — ACETAMINOPHEN 500 MG PO TABS
1000.0000 mg | ORAL_TABLET | Freq: Once | ORAL | Status: AC
  Administered 2020-10-19: 1000 mg via ORAL
  Filled 2020-10-19: qty 2

## 2020-10-19 MED ORDER — KETOROLAC TROMETHAMINE 15 MG/ML IJ SOLN
15.0000 mg | Freq: Once | INTRAMUSCULAR | Status: AC
  Administered 2020-10-19: 15 mg via INTRAMUSCULAR
  Filled 2020-10-19: qty 1

## 2020-10-19 NOTE — ED Provider Notes (Signed)
[facility] EMERGENCY DEPARTMENT Provider Note   CSN: [id number] Arrival date & time: 10/19/20  1346     History No chief complaint on file.   Michele Owens is a 60 y.o. female.  60 yO F with a chief complaints of chest pain and foot pain after falling about 3 days ago.  The foot pain is to bilateral feet but worse on the right than the left mostly along the heel when she walks.  She also has developed an ulcer to the right foot, has been an ongoing issue but she unfortunately has not been able to follow-up with the podiatry office.  Left-sided chest pain seems to come and go worse with deep breathing or coughing.  The history is provided by the patient.  Injury This is a recurrent problem. The current episode started more than 2 days ago. The problem occurs constantly. The problem has not changed since onset.Associated symptoms include chest pain. Pertinent negatives include no headaches and no shortness of breath. The symptoms are aggravated by walking, bending and twisting. Nothing relieves the symptoms. She has tried nothing for the symptoms.      Past Medical History:  Diagnosis Date   Anxiety    Arthritis    Asthma    Chronic diastolic CHF (congestive heart failure) (HCC)    COPD (chronic obstructive pulmonary disease) (HCC)    Uses Oxygen at night   Depression    Diabetes mellitus without complication (HCC)    Dyspnea    occ   Family history of thyroid cancer    GERD (gastroesophageal reflux disease)    Gout    Headache    migraines   History of DVT of lower extremity    History of nuclear stress test    Myoview 2/17:  Low risk stress nuclear study with a small, moderate intensity, partially reversible inferior lateral defect consistent with small prior infarct and minimal peri-infarct ischemia; EF 68 with normal wall motion   Hypertension    Pneumonia    Thyroid cancer ([place]) 09/23/2016    Patient Active Problem List   Diagnosis Date Noted    Acute on chronic respiratory failure with hypoxia and hypercapnia ([place]) 04/18/2020   Acute on chronic respiratory failure with hypoxia ([place]) 04/18/2020   Hemoptysis 09/73/5329   Acute metabolic encephalopathy 92/42/6834   Acute encephalopathy 04/18/2020   Chronic diastolic CHF (congestive heart failure) ([place]) 04/18/2020   Sepsis ([place]) 04/18/2020   Morbid obesity with BMI of 50.0-59.9, adult ([place])    Fall at home, initial encounter 06/23/2018   HNP (herniated nucleus pulposus), thoracic 06/23/2018   CHF (congestive heart failure) ([place]) 06/18/2018   Hypertensive disorder 08/28/2017   Obesity 08/28/2017   COPD exacerbation ([place]) 08/04/2017   Acute and chronic respiratory failure with hypercapnia ([place])    Pulmonary HTN ([place])    Genetic testing 02/27/2017   Family history of thyroid cancer    Hypomagnesemia 10/03/2016   Atypical chest pain    HLD (hyperlipidemia) 09/23/2016   Gout 09/23/2016   DVT (deep vein thrombosis) in pregnancy 09/23/2016   Thyroid cancer ([place]) 09/23/2016   Hypocalcemia 09/23/2016   AKI (acute kidney injury) ([place]) 09/23/2016   Acute pulmonary edema (HCC)    Acute hypoxemic respiratory failure ([place])    Ventilator dependent ([place])    Chronic respiratory failure with hypoxia and hypercapnia ([place]) 07/16/2016   CAP (community acquired pneumonia) 01/23/2016   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 01/23/2016   Pulmonary hypertension ([place])  01/23/2016   Obesity hypoventilation syndrome ([place]) 08/26/2015   Dyslipidemia associated with type 2 diabetes mellitus ([place]) 04/24/2015   Leukocytosis 04/24/2015   Controlled type 2 diabetes mellitus without complication, without long-term current use of insulin ([place]) 04/24/2015   Anxiety 04/24/2015   Benign essential HTN 04/24/2015   Normocytic anemia 04/24/2015   OSA (obstructive sleep apnea) 05/10/2012   Tobacco abuse 07/04/2011    Past Surgical History:  Procedure Laterality Date   BUNIONECTOMY Bilateral     CARDIAC CATHETERIZATION N/A 06/23/2015   Procedure: Right/Left Heart Cath and Coronary Angiography;  Surgeon: [name], MD;  Location: [facility];  Service: Cardiovascular;  Laterality: N/A;   COLONOSCOPY WITH PROPOFOL N/A 12/15/2015   Procedure: COLONOSCOPY WITH PROPOFOL;  Surgeon: [name], MD;  Location: [facility];  Service: Endoscopy;  Laterality: N/A;   RIGHT HEART CATH N/A 10/01/2019   Procedure: RIGHT HEART CATH;  Surgeon: [name], MD;  Location: [facility];  Service: Cardiovascular;  Laterality: N/A;   THYROIDECTOMY  09/19/2016   THYROIDECTOMY N/A 09/19/2016   Procedure: TOTAL THYROIDECTOMY;  Surgeon: [name], MD;  Location: [facility];  Service: General;  Laterality: N/A;   TONSILLECTOMY     TOTAL ABDOMINAL HYSTERECTOMY  07/14/10     OB History   No obstetric history on file.     Family History  Problem Relation Age of Onset   Cancer Father        thought to be due to exposure to concrete   Diabetes Mother    Thyroid cancer Mother        dx in her 59s-60s   Cancer Maternal Uncle        2 uncles with cancer NOS   Brain cancer Paternal Aunt    Cancer Cousin        maternal first cousin - NOS   Cancer Cousin        maternal first cousin - NOS    Social History   Tobacco Use   Smoking status: Former    Packs/day: 0.50    Years: 41.00    Pack years: 20.50    Types: Cigarettes    Quit date: 07/2016    Years since quitting: 4.2   Smokeless tobacco: Never   Tobacco comments:    Starting to [employer] -- using Nicotine Patch  Vaping Use   Vaping Use: Never used  Substance Use Topics   Alcohol use: Not Currently    Alcohol/week: 0.0 standard drinks    Comment: ? none now   Drug use: Never    Comment: none for long time    Home Medications Prior to Admission medications   Medication Sig Start Date End Date Taking? Authorizing Provider  albuterol (PROVENTIL HFA;VENTOLIN HFA) 108 (90 Base) MCG/ACT inhaler Inhale 1-2 puffs into the  lungs every 6 (six) hours as needed for wheezing or shortness of breath. 04/23/18   Long, [name], MD  allopurinol (ZYLOPRIM) 100 MG tablet Take 100 mg by mouth daily.    [provider]  benzonatate (TESSALON) 200 MG capsule Take 1 capsule (200 mg total) by mouth in the morning, at noon, and at bedtime. 7 DS 04/13/20   Regalado, Jerald Kief A, MD  Blood Glucose Monitoring Suppl (ACCU-CHEK AVIVA PLUS) w/Device KIT 1 each by Other route daily.  08/07/17   [provider]  butalbital-acetaminophen-caffeine (FIORICET) 50-325-40 MG tablet Take 1 tablet by mouth 2 (two) times daily as needed for migraine. 08/13/20  [provider]  calcitRIOL (ROCALTROL) 0.5 MCG capsule Take 1 capsule (0.5 mcg total) by mouth daily. 09/26/16   Hongalgi, [name], MD  carvedilol (COREG) 6.25 MG tablet Take 1 tablet (6.25 mg total) by mouth 2 (two) times daily with a meal. 01/23/18   Bensimhon, [name], MD  clonazePAM (KLONOPIN) 2 MG tablet Take 1 tablet (2 mg total) by mouth 3 (three) times daily. for anxiety Patient taking differently: Take 2 mg by mouth 3 (three) times daily. 06/23/19   [name], MD  cyclobenzaprine (FLEXERIL) 10 MG tablet Take 10 mg by mouth 3 (three) times daily. 11/19/19   [provider]  diclofenac sodium (VOLTAREN) 1 % GEL Apply 2 g topically 4 (four) times daily as needed (for pain). Patient not taking: No sig reported 06/19/18   [name], MD  EPINEPHrine 0.3 mg/0.3 mL IJ SOAJ injection Inject 0.3 mg into the muscle once as needed for anaphylaxis.    [provider]  FEROSUL 325 (65 Fe) MG tablet Take 325 mg by mouth daily. 03/22/17   [provider]  glucose blood test strip Use as instructed to check blood sugar up to 4 times daily Patient taking differently: 1 each by Other route See admin instructions. Use as instructed to check blood sugar up to 4 times daily 08/06/17   [name], DO  guaiFENesin (MUCINEX) 600 MG 12 hr tablet Take 2  tablets (1,200 mg total) by mouth 2 (two) times daily. 04/13/20   Regalado, Belkys A, MD  ipratropium-albuterol (DUONEB) 0.5-2.5 (3) MG/3ML SOLN Take 3 mLs by nebulization 4 (four) times daily.  04/05/17   [provider]  levothyroxine (SYNTHROID, LEVOTHROID) 112 MCG tablet Take 224 mcg by mouth daily before breakfast. 07/11/17   [provider]  lisinopril (PRINIVIL,ZESTRIL) 5 MG tablet Take 1 tablet (5 mg total) by mouth daily. Please call for office visit [phone] Patient taking differently: Take 5 mg by mouth daily. 08/15/17   [name], MD  loperamide (IMODIUM) 2 MG capsule Take 1 capsule (2 mg total) by mouth 4 (four) times daily as needed for diarrhea or loose stools. 0/16/55   [name], MD  metFORMIN (GLUCOPHAGE) 500 MG tablet Take 1 tablet (500 mg total) by mouth 2 (two) times daily with a meal. 12/11/14   [name], MD  nitroGLYCERIN (NITROSTAT) 0.4 MG SL tablet Place 1 tablet (0.4 mg total) under the tongue every 5 (five) minutes as needed for chest pain. 06/19/18   [name], MD  nystatin cream (MYCOSTATIN) Apply 1 application topically 2 (two) times daily as needed for dry skin. 04/13/20   Regalado, Belkys A, MD  nystatin ointment (MYCOSTATIN) Apply 1 application topically 2 (two) times daily. 09/10/20   [provider]  omeprazole (PRILOSEC) 20 MG capsule Take 20 mg by mouth 2 (two) times daily before a meal. 11/19/19   [provider]  oxyCODONE-acetaminophen (PERCOCET) 10-325 MG tablet Take 1 tablet by mouth every 6 (six) hours as needed for pain. 12/03/19   [provider]  OXYGEN Inhale 2-3 L into the lungs continuous.    [provider]  oxymetazoline (AFRIN NASAL SPRAY) 0.05 % nasal spray Place 1 spray into both nostrils 2 (two) times daily. 10/04/20   Pearson Grippe, DO  polyethylene glycol (MIRALAX / GLYCOLAX) 17 g packet Take 17 g by mouth daily as needed for mild constipation.    [provider]   potassium chloride SA (K-DUR,KLOR-CON) 20 MEQ tablet  Take 1 tablet (20 mEq total) by mouth 2 (two) times daily. 07/24/18   [name], MD  rosuvastatin (CRESTOR) 10 MG tablet Take 10 mg by mouth every evening. 11/19/19   [provider]  senna-docusate (SENNA-PLUS) 8.6-50 MG tablet Take 1 tablet by mouth daily.    [provider]  torsemide (DEMADEX) 20 MG tablet TAKE 4 (FOUR) TABLETS BY MOUTH DAILY (2AM+ 2NOON) Patient taking differently: Take 40 mg by mouth 2 (two) times daily with breakfast and lunch. 05/14/20   [name], MD  traZODone (DESYREL) 50 MG tablet Take 100 mg by mouth at bedtime. 11/19/19   [provider]  triamcinolone cream (KENALOG) 0.1 % Apply 1 application topically 2 (two) times daily. 09/10/20   [provider]  umeclidinium-vilanterol (ANORO ELLIPTA) 62.5-25 MCG/INH AEPB Inhale 1 puff into the lungs daily.    [provider]    Allergies    Bee venom, Fioricet [butalbital-apap-caffeine], Ibuprofen, Lamisil [terbinafine], and Nsaids  Review of Systems   Review of Systems  Constitutional:  Negative for chills and fever.  HENT:  Negative for congestion and rhinorrhea.   Eyes:  Negative for redness and visual disturbance.  Respiratory:  Negative for shortness of breath and wheezing.   Cardiovascular:  Positive for chest pain. Negative for palpitations.  Gastrointestinal:  Negative for nausea and vomiting.  Genitourinary:  Negative for dysuria and urgency.  Musculoskeletal:  Positive for arthralgias. Negative for myalgias.  Skin:  Negative for pallor and wound.  Neurological:  Negative for dizziness and headaches.   Physical Exam Updated Vital Signs BP (!) 107/51   Pulse 90   Temp 99 F (37.2 C)   Resp 18   SpO2 93%   Physical Exam Vitals and nursing note reviewed.  Constitutional:      General: She is not in acute distress.    Appearance: She is well-developed. She is not diaphoretic.  HENT:     Head:  Normocephalic and atraumatic.  Eyes:     Pupils: Pupils are equal, round, and reactive to light.  Cardiovascular:     Rate and Rhythm: Normal rate and regular rhythm.     Heart sounds: No murmur heard.   No friction rub. No gallop.  Pulmonary:     Effort: Pulmonary effort is normal.     Breath sounds: No wheezing or rales.     Comments: Tender about the left anterior chest about the midclavicular line ribs 3 through 5 reproduces her pain. Chest:     Chest wall: Tenderness present.  Abdominal:     General: There is no distension.     Palpations: Abdomen is soft.     Tenderness: There is no abdominal tenderness.  Musculoskeletal:        General: Tenderness present.     Cervical back: Normal range of motion and neck supple.     Comments: Some tenderness along the calcaneus of the right foot.  No obvious erythema or fluctuance.  There is a ulcer on the medial aspect just below the malleolus.  Pulse motor and sensation intact bilaterally.  Skin:    General: Skin is warm and dry.  Neurological:     Mental Status: She is alert and oriented to person, place, and time.  Psychiatric:        Behavior: Behavior normal.    ED Results / Procedures / Treatments   Labs (all labs ordered are listed, but only abnormal results are displayed) Labs Reviewed  BASIC METABOLIC  PANEL - Abnormal; Notable for the following components:      Result Value   Chloride 95 (*)    CO2 37 (*)    Glucose, Bld 101 (*)    Calcium 8.0 (*)    All other components within normal limits  CBC - Abnormal; Notable for the following components:   WBC 15.5 (*)    RBC 3.77 (*)    Hemoglobin 10.9 (*)    MCHC 29.8 (*)    All other components within normal limits  TROPONIN I (HIGH SENSITIVITY)  TROPONIN I (HIGH SENSITIVITY)    EKG EKG Interpretation  Date/Time:  Tuesday October 19 2020 15:28:11 EDT Ventricular Rate:  92 PR Interval:  164 QRS Duration: 66 QT Interval:  370 QTC Calculation: 457 R Axis:   75 Text  Interpretation: Normal sinus rhythm Low voltage QRS Cannot rule out Anterior infarct , age undetermined Abnormal ECG No significant change since last tracing Confirmed by Deno Etienne [phone]) on 10/19/2020 7:40:55 PM  Radiology DG Chest 2 View  Result Date: 10/19/2020 CLINICAL DATA:  Chest pain. EXAM: CHEST - 2 VIEW COMPARISON:  Single-view of the chest 09/11/2020. PA and lateral chest 08/21/2020. FINDINGS: Again seen is bibasilar atelectasis. There is cardiomegaly. No pneumothorax or pleural effusion. Lung volumes are low. No acute or focal bony abnormality. IMPRESSION: Bibasilar atelectasis in a low volume chest. Cardiomegaly. Electronically Signed   By: Inge Rise M.D.   On: 10/19/2020 17:31   DG Foot Complete Right  Result Date: 10/19/2020 CLINICAL DATA:  60 year old female with right foot pain. EXAM: RIGHT FOOT COMPLETE - 3+ VIEW COMPARISON:  None. FINDINGS: There is no acute fracture or dislocation. The bones are osteopenic. Diffuse subcutaneous edema. IMPRESSION: No acute fracture or dislocation. Electronically Signed   By: Anner Crete M.D.   On: 10/19/2020 21:36   DG Toe Great Left  Result Date: 10/19/2020 CLINICAL DATA:  Left great toe pain.  No known injury. EXAM: LEFT GREAT TOE COMPARISON:  None. FINDINGS: There is no acute bony or joint abnormality. Scattered interphalangeal joint osteoarthritis appears worst at the DIP joint of the third toe. Midfoot degenerative change is also seen. Soft tissues are negative. IMPRESSION: No acute finding. Multifocal osteoarthritis. Electronically Signed   By: Inge Rise M.D.   On: 10/19/2020 17:32    Procedures Procedures   Medications Ordered in ED Medications  ketorolac (TORADOL) 15 MG/ML injection 15 mg (15 mg Intramuscular Given 10/19/20 2011)  acetaminophen (TYLENOL) tablet 1,000 mg (1,000 mg Oral Given 10/19/20 2012)    ED Course  I have reviewed the triage vital signs and the nursing notes.  Pertinent labs & imaging results  that were available during my care of the patient were reviewed by me and considered in my medical decision making (see chart for details).    MDM Rules/Calculators/A&P                          26 y F with a chief complaints of chest pain and foot pain after a fall.  Plain film of the chest and left foot viewed by me without obvious fracture.  No noted pneumothorax.  Patient also complaining of pain of the right foot and requesting an x-ray.  Plain film of the right foot viewed by me without fracture.  Will discharge home.  PCP follow-up.  11:16 PM:  I have discussed the diagnosis/risks/treatment options with the patient and believe the pt to be eligible for  discharge home to follow-up with PCP. We also discussed returning to the ED immediately if new or worsening sx occur. We discussed the sx which are most concerning (e.g., sudden worsening pain, fever, inability to tolerate by mouth) that necessitate immediate return. Medications administered to the patient during their visit and any new prescriptions provided to the patient are listed below.  Medications given during this visit Medications  ketorolac (TORADOL) 15 MG/ML injection 15 mg (15 mg Intramuscular Given 10/19/20 2011)  acetaminophen (TYLENOL) tablet 1,000 mg (1,000 mg Oral Given 10/19/20 2012)     The patient appears reasonably screen and/or stabilized for discharge and I doubt any other medical condition or other [facility] requiring further screening, evaluation, or treatment in the ED at this time prior to discharge.   Final Clinical Impression(s) / ED Diagnoses Final diagnoses:  Bilateral foot pain  Traumatic chest pain    Rx / DC Orders ED Discharge Orders     None        Deno Etienne, DO 10/19/20 2316

## 2020-10-19 NOTE — ED Notes (Signed)
Pt provided discharge instructions and prescription information. Pt was given the opportunity to ask questions and questions were answered. Discharge signature not obtained in the setting of the COVID-19 pandemic in order to reduce high touch surfaces.  ° °

## 2020-10-19 NOTE — ED Provider Notes (Signed)
Emergency Medicine Provider Triage Evaluation Note  Michele Owens , a 60 y.o. female  was evaluated in triage.  Pt complains of multiple complaints, states she is having intermittent chest pain, states that in the center of her chest, has since resolved, also notes that she has bilateral feet pain, denies units on the area.  She has no other complaints at this time..  Review of Systems  Positive: Chest pain, bilateral feet pain- Negative: Shortness of breath, abdominal pain  Physical Exam  BP 126/72 (BP Location: Left Arm)   Pulse (!) 101   Temp 98.2 F (36.8 C) (Oral)   Resp 15   SpO2 99%  Gen:   Awake, no distress   Resp:  Normal effort  MSK:   Moves extremities without difficulty  Other:  Patient has well-appearing ulcer on her right foot distally to the medial malleolus.  She also notes that she has pain on her left great toe, no gross deformities present.  Medical Decision Making  Medically screening exam initiated at 3:53 PM.  Appropriate orders placed.  Michele Owens was informed that the remainder of the evaluation will be completed by another provider, this initial triage assessment does not replace that evaluation, and the importance of remaining in the ED until their evaluation is complete.  Presents with chest pain, bilateral feet pain imaging have been ordered, patient further work-up in the emergency department.   Marcello Fennel, PA-C 10/19/20 1555    Carmin Muskrat, MD 10/19/20 [phone]

## 2020-10-19 NOTE — ED Triage Notes (Signed)
Patient complains of left great toe pain for 2 days falling trip and fall. Also wants right ankle wound checked and complains of chest pain since am. No cough, no radiation. Alert and oriented

## 2020-10-19 NOTE — ED Notes (Signed)
Patient is being discharged from the Urgent Care and sent to the Emergency Department via wheelchair . Per Merrie Roof PA-C, patient is in need of higher level of care due to chest pain and abnormal EKG. Patient is aware and verbalizes understanding of plan of care.  Vitals:   10/19/20 1313  BP: (!) 113/53  Pulse: (!) 110  Resp: 17  Temp: 98.4 F (36.9 C)  SpO2: 91%

## 2020-10-19 NOTE — ED Triage Notes (Signed)
Pt is present today with toe injury to both big toes. Pt states that her injury is due to a fall that happened last week.

## 2020-10-19 NOTE — ED Notes (Signed)
Pt talking about calling [facility] for pick-up. PT has cell phone.

## 2020-10-19 NOTE — ED Notes (Signed)
Pt asked how she normally gets home from the ED. Pt states she takes a cab. Advised pt with her frequent falls we did not recommend a cab, but would provide her with a voucher as a last resport. Pt on the phone with a family member and pt states family on the way to pick her up.

## 2020-10-19 NOTE — Discharge Instructions (Addendum)
Follow-up with your family doctor and with the wound clinic if able.

## 2020-10-21 DIAGNOSIS — I110 Hypertensive heart disease with heart failure: Secondary | ICD-10-CM | POA: Diagnosis not present

## 2020-10-22 ENCOUNTER — Other Ambulatory Visit: Payer: Self-pay

## 2020-10-22 DIAGNOSIS — I110 Hypertensive heart disease with heart failure: Secondary | ICD-10-CM | POA: Diagnosis not present

## 2020-10-22 NOTE — Patient Outreach (Signed)
Care Coordination  10/22/2020  Michele Owens [birth year] [id number]  Patient stated she was talking with someone by the name of Michele Owens regarding new diabetic shoes since she has had some recent falls. Patient stated she would contact social worker back once she got it taken care of.   Mickel Fuchs, BSW, [place] Managed Medicaid Team  [phone]

## 2020-10-22 NOTE — Patient Instructions (Signed)
Visit Information  Ms. Grumbine was given information about Medicaid Managed Care team care coordination services as a part of their [facility] Medicaid benefit. [name] verbally consented to engagement with the [facility] Managed Care team.   For questions related to your [facility] health plan, please call: [phone] or go here:[email or web address]  If you would like to schedule transportation through your [facility] plan, please call the following number at least 2 days in advance of your appointment: [phone].  Call the [facility] at [phone], at any time, 24 hours a day, 7 days a week. If you are in danger or need immediate medical attention call 911.  If you would like help to quit smoking, call 1-800-QUIT-NOW 779 448 7495) OR Espaol: 1-855-Djelo-Ya (3-128-118-8677) o para ms informacin haga clic aqu or Text READY to 200-400 to register via text  Ms. Stacy - following are the goals we discussed in your visit today:   Goals Addressed   None         Mickel Fuchs, BSW, [place] Medicaid Team  [phone]   Following is a copy of your plan of care:

## 2020-10-26 ENCOUNTER — Other Ambulatory Visit: Payer: Self-pay | Admitting: Obstetrics and Gynecology

## 2020-10-26 ENCOUNTER — Other Ambulatory Visit: Payer: Self-pay

## 2020-10-26 DIAGNOSIS — M25562 Pain in left knee: Secondary | ICD-10-CM | POA: Diagnosis not present

## 2020-10-26 DIAGNOSIS — Z7689 Persons encountering health services in other specified circumstances: Secondary | ICD-10-CM | POA: Diagnosis not present

## 2020-10-26 NOTE — Patient Outreach (Signed)
Medicaid Managed Care   Nurse Care Manager Note  10/26/2020 Name:  Michele Owens MRN:  [id number] DOB:  [birth year]  Michele Owens is an 60 y.o. year old female who is a primary patient of Michele Reach, MD.  The [facility] Managed Care Coordination team was consulted for assistance with:    Chronic healthcare management needs.  Michele Owens was given information about Medicaid Managed Care Coordination team services today. Michele Owens agreed to services and verbal consent obtained.  Engaged with patient by telephone for follow up visit in response to provider referral for case management and/or care coordination services.   Assessments/Interventions:  Review of past medical history, allergies, medications, health status, including review of consultants reports, laboratory and other test data, was performed as part of comprehensive evaluation and provision of chronic care management services.  SDOH (Social Determinants of Health) assessments and interventions performed:   Care Plan  Allergies  Allergen Reactions   Bee Venom Swelling and Other (See Comments)    "All over my body" (swelling)   Fioricet [Butalbital-Apap-Caffeine] Nausea And Vomiting and Rash   Ibuprofen Rash and Other (See Comments)    Severe rash   Lamisil [Terbinafine] Rash and Other (See Comments)    Pt states this causes her to "feel funny"   Nsaids Other (See Comments)    Per MD's orders     Medications Reviewed Today     Reviewed by Michele Medicus, RN (Registered Nurse) on 10/26/20 at [phone]  Med List Status: <None>   Medication Order Taking? Sig Documenting Provider Last Dose Status Informant  albuterol (PROVENTIL HFA;VENTOLIN HFA) 108 (90 Base) MCG/ACT inhaler [id number] No Inhale 1-2 puffs into the lungs every 6 (six) hours as needed for wheezing or shortness of breath. Margette Fast, MD 08/13/2020 Unknown time Active Self           Med Note Michele Owens Oct 04, 2020  8:43 PM) Michele Owens 05/12/2020 [facility] per external pharmacy records   allopurinol (ZYLOPRIM) 100 MG tablet [id number] No Take 100 mg by mouth daily. [provider] 08/13/2020 Unknown time Active Self           Med Note Michele Owens   Mon Oct 04, 2020  8:30 PM) #30 filled 09/10/2020 [facility] per external pharmacy records   benzonatate (TESSALON) 200 MG capsule [id number] No Take 1 capsule (200 mg total) by mouth in the morning, at Owens, and at bedtime. 7 DS Elmarie Shiley, MD 08/13/2020 Unknown time Active Self           Med Note Michele Owens   Mon Oct 04, 2020  8:30 PM) #21 filled 09/27/2020 [facility] per external pharmacy records  Blood Glucose Monitoring Suppl (ACCU-CHEK AVIVA PLUS) w/Device KIT [id number] No 1 each by Other route daily.  [provider] unk unk Active Self  butalbital-acetaminophen-caffeine (FIORICET) 50-325-40 MG tablet [id number] No Take 1 tablet by mouth 2 (two) times daily as needed for migraine. [provider] Past Month Unknown time Active Self           Med Note Michele Owens Oct 04, 2020  8:31 PM) #60/30 days filled 09/10/2020 [facility] per external pharmacy records   calcitRIOL (ROCALTROL) 0.5 MCG capsule [id number] No Take 1 capsule (0.5 mcg total) by mouth daily. Modena Jansky, MD 08/13/2020 Unknown time Active Self           Med Note (ATKINS, HEATHER  Owens   Mon Oct 04, 2020  8:31 PM) #30 filled 09/10/2020 [facility] per external pharmacy records   carvedilol (COREG) 6.25 MG tablet [id number] No Take 1 tablet (6.25 mg total) by mouth 2 (two) times daily with a meal. Bensimhon, [name], MD 08/13/2020 3 am Active Self           Med Note Payton Doughty   Mon Oct 04, 2020  8:31 PM) #60 filled 09/10/2020 [facility] per external pharmacy records   clonazePAM (KLONOPIN) 2 MG tablet [id number] No Take 1 tablet (2 mg total) by mouth 3 (three) times daily. for anxiety  Patient taking differently: Take 2 mg by mouth 3 (three) times  daily.   [name], MD 08/13/2020 Unknown time Active            Med Note Michele Owens Oct 04, 2020  8:25 PM) #90/30 days filled 09/07/2020 [facility] per PMP AWARE  cyclobenzaprine (FLEXERIL) 10 MG tablet [id number] No Take 10 mg by mouth 3 (three) times daily. [provider] 08/13/2020 Unknown time Active Self           Med Note Michele Owens   Mon Oct 04, 2020  8:32 PM) #90/30 days filled 09/10/2020 [facility] per external pharmacy records   diclofenac sodium (VOLTAREN) 1 % GEL [id number] No Apply 2 g topically 4 (four) times daily as needed (for pain).  Patient not taking: No sig reported   [name], MD Not Taking Unknown time Active Self  EPINEPHrine 0.3 mg/0.3 mL IJ SOAJ injection [id number] No Inject 0.3 mg into the muscle once as needed for anaphylaxis. [provider] unk last dose Active Self           Med Note Michele Owens Oct 04, 2020  8:41 PM) LF 07/20/2020 [facility] per external pharmacy records   FEROSUL 325 (65 Fe) MG tablet [id number] No Take 325 mg by mouth daily. [provider] 08/13/2020 Unknown time Active Self           Med Note Michele Owens Oct 04, 2020  8:32 PM) #30 filled 09/10/2020 [facility] per external pharmacy records   glucose blood test strip [id number] No Use as instructed to check blood sugar up to 4 times daily  Patient taking differently: 1 each by Other route See admin instructions. Use as instructed to check blood sugar up to 4 times daily   [name], DO unk unk Active Self  guaiFENesin (MUCINEX) 600 MG 12 hr tablet [id number] No Take 2 tablets (1,200 mg total) by mouth 2 (two) times daily. Regalado, Belkys A, MD 08/13/2020 Unknown time Active Self  ipratropium-albuterol (DUONEB) 0.5-2.5 (3) MG/3ML SOLN [id number] No Take 3 mLs by nebulization 4 (four) times daily.  [provider] 08/12/2020 Unknown time Active Self           Med Note Michele Owens Oct 04, 2020  8:43 PM) LF 05/17/2020 [facility] per external pharmacy records   levothyroxine (SYNTHROID, LEVOTHROID) 112 MCG tablet [id number] No Take 224 mcg by mouth daily before breakfast. [provider] 08/13/2020 Unknown time Active Self           Med Note Michele Owens Oct 04, 2020  8:33 PM) #60/30 days filled 09/10/2020 [facility] per external pharmacy records   lisinopril (PRINIVIL,ZESTRIL) 5 MG tablet [id number] No Take 1 tablet (5  mg total) by mouth daily. Please call for office visit [phone]  Patient taking differently: Take 5 mg by mouth daily.   [name], MD 08/13/2020 Unknown time Active            Med Note Michele Owens Oct 04, 2020  8:33 PM) #30 filled 09/10/2020 [facility] per external pharmacy records   loperamide (IMODIUM) 2 MG capsule [id number] No Take 1 capsule (2 mg total) by mouth 4 (four) times daily as needed for diarrhea or loose stools. [name], MD Past Month Unknown time Active Self  metFORMIN (GLUCOPHAGE) 500 MG tablet [id number] No Take 1 tablet (500 mg total) by mouth 2 (two) times daily with a meal. [name], MD 08/13/2020 Unknown time Active Self           Med Note Payton Doughty   Mon Oct 04, 2020  8:34 PM) #60 filled 09/10/2020 [facility] per external pharmacy records   nitroGLYCERIN (NITROSTAT) 0.4 MG SL tablet [id number] No Place 1 tablet (0.4 mg total) under the tongue every 5 (five) minutes as needed for chest pain. [name], MD unk last dose Active Self           Med Note Michele Owens Oct 04, 2020  8:43 PM) LF 04/28/2020 [facility] per external pharmacy records   nystatin cream (MYCOSTATIN) [id number] No Apply 1 application topically 2 (two) times daily as needed for dry skin. Elmarie Shiley, MD Past Week Unknown time Active Self           Med Note Michele Owens Oct 04, 2020  8:42 PM) LF 07/19/2020 [facility] per external pharmacy records    nystatin ointment (MYCOSTATIN) [id number]  Apply 1 application topically 2 (two) times daily. [provider]  Active            Med Note Michele Owens Oct 04, 2020  8:35 PM) LF 09/10/2020 [facility] per external pharmacy records   omeprazole (PRILOSEC) 20 MG capsule [id number] No Take 20 mg by mouth 2 (two) times daily before a meal. [provider] 08/13/2020 Unknown time Active Self           Med Note Michele Owens   Mon Oct 04, 2020  8:35 PM) #60 filled 09/10/2020 [facility] per external pharmacy records   oxyCODONE-acetaminophen (PERCOCET) 10-325 MG tablet [id number] No Take 1 tablet by mouth every 6 (six) hours as needed for pain. [provider] 08/13/2020 Unknown time Active Self           Med Note Michele Owens   Mon Oct 04, 2020  8:25 PM) #120/30 days filled 09/10/2020 [facility] per PMP AWARE  OXYGEN [id number] No Inhale 2-3 Owens into the lungs continuous. [provider] 08/13/2020 Unknown time Active Self           Med Note Dorina Hoyer, [facility]   Mon Apr 12, 2020 10:08 AM)    oxymetazoline (AFRIN NASAL SPRAY) 0.05 % nasal spray [id number]  Place 1 spray into both nostrils 2 (two) times daily. Pearson Grippe, DO  Active   polyethylene glycol (MIRALAX / GLYCOLAX) 17 g packet [id number] No Take 17 g by mouth daily as needed for mild constipation. [provider] Past Week Unknown time Active Self           Med Note Michele Owens Oct 04, 2020  8:30 PM) 238 g filled 09/21/2020 [facility] per external pharmacy records   potassium chloride SA (K-DUR,KLOR-CON) 20 MEQ tablet [id number] No Take 1 tablet (20 mEq total) by mouth 2 (two) times daily. [name], MD 08/13/2020 Unknown time Active Self           Med Note Michele Owens Oct 04, 2020  8:36 PM) #60 filled 09/10/2020 [facility] per external pharmacy records   rosuvastatin (CRESTOR) 10 MG tablet [id number] No Take 10 mg by mouth every evening.  [provider] 08/13/2020 Unknown time Active Self           Med Note Michele Owens Oct 04, 2020  8:36 PM) #30 filled 09/10/2020 [facility] per external pharmacy records   senna-docusate (SENNA-PLUS) 8.6-50 MG tablet [id number]  Take 1 tablet by mouth daily. [provider]  Active            Med Note Michele Owens Oct 04, 2020  8:37 PM) #30 filled 09/10/2020 [facility] per external pharmacy records   torsemide (DEMADEX) 20 MG tablet [id number] No TAKE 4 (FOUR) TABLETS BY MOUTH DAILY (2AM+ 2NOON)  Patient taking differently: Take 40 mg by mouth 2 (two) times daily with breakfast and lunch.   [name], MD 08/13/2020 Unknown time Active            Med Note Michele Owens Oct 04, 2020  8:38 PM) #120/30 days filled 09/10/2020 [facility] per external pharmacy records   traZODone (DESYREL) 50 MG tablet [id number] No Take 100 mg by mouth at bedtime. [provider] 08/12/2020 Unknown time Active Self           Med Note Michele Owens Oct 04, 2020  8:39 PM) #60 filled 09/10/2020 [facility] per external pharmacy records   triamcinolone cream (KENALOG) 0.1 % [id number]  Apply 1 application topically 2 (two) times daily. [provider]  Active            Med Note Michele Owens Oct 04, 2020  8:40 PM) LF 09/10/2020 [facility] per external pharmacy records   umeclidinium-vilanterol [facility] ELLIPTA) 62.5-25 MCG/INH AEPB [id number]  Inhale 1 puff into the lungs daily. [provider]  Active            Med Note Michele Owens Oct 04, 2020  8:41 PM) 30DS filled 09/10/2020 [facility] per external pharmacy records             Patient Active Problem List   Diagnosis Date Noted   Acute on chronic respiratory failure with hypoxia and hypercapnia ([place]) 04/18/2020   Acute on chronic respiratory failure with hypoxia ([place]) 04/18/2020   Hemoptysis 40/11/6759   Acute metabolic  encephalopathy 95/12/3265   Acute encephalopathy 04/18/2020   Chronic diastolic CHF (congestive heart failure) ([place]) 04/18/2020   Sepsis ([place]) 04/18/2020   Morbid obesity with BMI of 50.0-59.9, adult ([place])    Fall at home, initial encounter 06/23/2018   HNP (herniated nucleus pulposus), thoracic 06/23/2018   CHF (congestive heart failure) ([place]) 06/18/2018   Hypertensive disorder 08/28/2017   Obesity 08/28/2017   COPD exacerbation ([place]) 08/04/2017   Acute and chronic respiratory failure with hypercapnia ([place])    Pulmonary HTN ([place])    Genetic testing 02/27/2017   Family history of thyroid cancer    Hypomagnesemia 10/03/2016   Atypical  chest pain    HLD (hyperlipidemia) 09/23/2016   Gout 09/23/2016   DVT (deep vein thrombosis) in pregnancy 09/23/2016   Thyroid cancer ([place]) 09/23/2016   Hypocalcemia 09/23/2016   AKI (acute kidney injury) ([place]) 09/23/2016   Acute pulmonary edema ([place])    Acute hypoxemic respiratory failure (H[facility])    Ventilator dependent ([place])    Chronic respiratory failure with hypoxia and hypercapnia ([place]) 07/16/2016   CAP (community acquired pneumonia) 01/23/2016   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 01/23/2016   Pulmonary hypertension ([place]) 01/23/2016   Obesity hypoventilation syndrome ([place]) 08/26/2015   Dyslipidemia associated with type 2 diabetes mellitus ([place]) 04/24/2015   Leukocytosis 04/24/2015   Controlled type 2 diabetes mellitus without complication, without long-term current use of insulin ([place]) 04/24/2015   Anxiety 04/24/2015   Benign essential HTN 04/24/2015   Normocytic anemia 04/24/2015   OSA (obstructive sleep apnea) 05/10/2012   Tobacco abuse 07/04/2011    Conditions to be addressed/monitored per PCP order:   chrinic healthcare management needs, DM2, OSA, HTN, anxiety, CHF.  Care Plan : General Plan of Care (Adult)  Updates made by Michele Medicus, RN since 10/26/2020 12:00 AM     Problem: Barriers to Treatment   Priority:  High  Onset Date: 04/23/2020     Long-Range Goal: Barriers to Treatment Identified and Managed   Start Date: 05/03/2020  Expected End Date: 11/30/2020  Recent Progress: On track  Priority: Medium  Note:   CARE PLAN ENTRY Medicaid Managed Care (see longitudinal plan of care for additional care plan information)  Current Barriers:  Transportation barriers-patient utilizing [facility] and [employer] as needed. Limited access to caregiver-patient needs PCS, currently not receiving services she is expecting. Patient choosing not to use aides provided to her-discussed finding another agency with patient. Update 10/26/20:  Patient states aide is coming M-F 10-12-"Lucy"  Nurse Clinical Goal(s):  Over the next 30 days, patient will work with [employer] to address needs related to transportation. Update 05/12/20:  Patient has used [employer] and is very pleased. Over the next 90 days, patient will attend all scheduled medical appointments. RNCM will make referral to SW for assistance with PCS Update 05/20/20:  Patient states she is now receiving PCS from [employer]. John Owens. [facility] 3 days a week for 4 hours each day. Update 05/27/20: BSW spoke with patient about PCS, she stated someone usually comes in 3 days a week for a few hours. She stated her aide was off today. BSW contacted [place] and spoke with [facility], she stated that patient has 13 hours per week with 6 days a week, however patient chooses to have an aide come out 3 days a week so that she can have more hours. The aide assist with most ADL's. Update 06/04/20: Patient stated she is ready to move to [facility]. She has already started the process but wants to save some money first and she has to wait on her nephew who will drive the uhaul truck. Update 06/24/20: BSW contacted patient to follow up on her moving to [facility], she stated she is still saving money. Patient did cut call short  because she was sleeping, patient stated she was not in need of any resources at this time. Update 08/27/20: Bsw received information from [facility] about patient needing some batteries for her remote. BSW contacted patient to see if any resources were needed. Patient stated she contacted [employer] and they informed her it would cost 40  dollars to replace her remote, patient states she only needs batteries. BSW asked if patient had anyone to call that could bring her some batteries, patient stated no. She states her daughter is mad at her right now and working so she does not talk to her, patient states she has not family to call. BSW asked if her aide could help, patient states she has not had an aide in about a week, she had to let her last aide go because she smelled like marijuana and they have not replaced her. BSW contacted patients healthcare agency [facility] at [phone] and left a voicemail. Update 09/30/20:  Patient has aides available to her at [facility] but chooses not to use them. Discussion with [facility] and they continue to look for another aide for Ms. Grandinetti. Discussed with patient about her finding a new agency. 10/05/20: BSW received a telephone call from [facility] with patient on the line. Patient stated that she went to the ED and her eye was still hurting from a fall. Patient stated she has the information for her eye doctor and was getting ready to contact them. Patient stated she has not recieieved an aide since last Thursday.  Update 10/26/20:  Patient now has aide coming M-F 10-12, "Lucy"  Interventions: Inter-disciplinary care team collaboration (see longitudinal plan of care) Referred patient to community resources care guide team for assistance with transportation. Provided patient with information about [facility] and [employer]. Discussed plans with patient for ongoing care management follow up and provided patient with direct contact  information for care management team Advised patient to call [facility] or [employer]. Collaborated with [employer] and [place] regarding transportation. Assisted patient/caregiver with obtaining information about health plan benefits Collaborated with Social Work regarding PCS. Update 08/30/20:  Patient continues to have issues with PCS-SW is investigating. Update 06/14/20:  Patient continues to utilize [employer], aide, [employer] as needed-has missed appointments due to transportation "no shows"-will continue to follow. Update 08/30/20:  patient has been successful in attending appointments with [facility] and [facility] transportation. Update 09/30/20:  Patient has upcoming appointments with [facility] Pharmacist and SW. Patient Self Care Activities:  Patient will call [place]. Update 06/07/20:  Patient continues to utilize [facility] with success. Update 06/14/20:  Patient has missed appointments due to transportation no shows.  [facility] transportation has been utilized as needed. Update 09/30/20:  Patient with no transportation problems right now-uses [employer] or [place] as needed for appointments with success.   Care Plan : General Plan of Care (Adult)  Updates made by Michele Medicus, RN since 10/26/2020 12:00 AM     Problem: Health Promotion or Disease Self-Management (General Plan of Care)   Priority: High  Onset Date: 04/23/2020     Long-Range Goal: Self-Management Plan Developed   Start Date: 04/23/2020  Expected End Date: 11/30/2020  Recent Progress: On track  Priority: Medium  Note:   Current Barriers:  Chronic Disease Management support and education needs.  Patient with abdominal wall infection-seen at [facility] ED 05/24/20 - I&D performed. Update 06/01/20:  Patient states she is improving-HHRN is cleaning and dressing infection. Update 06/07/20:  Infection continues to improve per patient, HHRN continues to  clean and dress infection site. Update 06/17/20:  Infection improved, healed. Update 10/26/20:  Patient with recent fall injuring eye and ankle-patient states eye is healing and she is seeing provider today about ankle and knee.  Nurse Case Manager Clinical Goal(s):  Over the next 30 days, patient will attend all scheduled medical appointments:  Over the next 30 days, patient will work with CM team pharmacist to review medications. Update 05/20/20:  Patient has appointment with Pharmacist 05/21/20. Update 05/25/20:  Patient declines to speak with Pharmacist at this time. Update 08/30/20:  Patient is working with Pharmacist.  Interventions:  Inter-disciplinary care team collaboration (see longitudinal plan of care) Evaluation of current treatment plan and patient's adherence to plan as established by provider. Reviewed medications with patient. Collaborated with pharmacist regarding medications. Discussed plans with patient for ongoing care management follow up and provided patient with direct contact information for care management team Reviewed scheduled/upcoming provider appointments.  Update 05/20/20:  Patient unable to attend PT appointment 05/19/20, has next appointment 05/26/20.  Has Cardiology appointment 05/28/20.  Reviewed [place] information with patient again and with aide. Update 06/07/20:  Patient has PT appointment 06/09/20. Update 06/17/20:  patient attended PT appointment 06/16/20 and also attended appointment at [facility] this week-patient states she feels better. Pharmacy referral for medication review. Update 05/24/20: Pharmacist has attempted to speak with patient and has been unable to.  Patient at Urgent Care today with stomach pain.  RNCM will reschedule appointment with patient and speak with her regarding importance of speaking with Pharmacist. Update 05/25/20: Patient declines to speak with Pharmacist at this time. Update 08/30/20:  Patient is working with  Pharmacist for medication management. Update 09/30/20:  Patient without complaint today, has upcoming appointments with [facility] Pharmacist and SW.  Patient Goals/Self-Care Activities Over the next 30 days, patient will:  -Attends all scheduled provider appointments Calls pharmacy for medication refills Calls provider office for new concerns or questions  Follow Up Plan: The Managed Medicaid care management team will Owens out to the patient again over the next 30 days.  The patient has been provided with contact information for the Managed Medicaid care management team and has been advised to call with any health related questions or concerns.    Follow Up:  Patient agrees to Care Plan and Follow-up.  Plan: The Managed Medicaid care management team will Owens out to the patient again over the next 30 days. and The patient has been provided with contact information for the Managed Medicaid care management team and has been advised to call with any health related questions or concerns.  Date/time of next scheduled RN care management/care coordination outreach:  11/26/20 at 0900.

## 2020-10-26 NOTE — Patient Instructions (Signed)
Visit Information  Michele Owens was given information about Medicaid Managed Care team care coordination services as a part of their [facility] Medicaid benefit. Michele Owens verbally consented to engagement with the [place] Managed Care team.   For questions related to your [facility] health plan, please call: [phone] or go here:[email or web address]  If you would like to schedule transportation through your [facility] plan, please call the following number at least 2 days in advance of your appointment: [phone].  Call the [place] at [phone], at any time, 24 hours a day, 7 days a week. If you are in danger or need immediate medical attention call 911.  If you would like help to quit smoking, call 1-800-QUIT-NOW 3087165424) OR Espaol: 1-855-Djelo-Ya (9-678-938-1017) o para ms informacin haga clic aqu or Text READY to 200-400 to register via text  Michele Owens - following are the goals we discussed in your visit today:   Goals Addressed             This Visit's Progress    Protect My Health       Timeframe:  Long-Range Goal Priority:  Medium Start Date:    04/23/20                         Expected End Date: ongoing           Follow Up Date: 11/26/20   - schedule appointment for vaccines needed due to my age or health - schedule recommended health tests (blood work, mammogram, colonoscopy, pap test) - schedule and keep appointment for annual check-up  Patient has PT appointment 05/19/20. Update 05/20/20:  Patient unable to attend PT appointment on 05/19/20.  Has next PT appointment 05/26/20.  Cardiology appointment 05/28/20. Update 05/24/20:  Attempted to reach patient today-she is at Urgent Care with stomach pain.  Will reschedule. Update 05/25/20:  Patient seen at Urgent Care and [place] 05/24/20.  Patient with abscess- given antibiotics. Update 06/01/20:  Patient continues to improve, HHRN cleaning and changing dressing. Update  06/07/20:  Patient states abscess continues to improve.  Has PT on Wednesday, 06/09/20. Update 06/14/20:  Abscess healed.  Patient missed PT appointment as transportation did not pick her up-appointment rescheduled to 06/16/20. Update 06/17/20:  Patient attended PT appointment yesterday and also attended appointment at [facility] this week-Patient states she feels better. Update 08/24/20:  Patient has appointment at [facility] 08/25/20 at 0930. Update 08/30/20:  Patient without complaints today, awaiting therapy services to start. Update 09/30/20:  Patient without complaint today, friend of family recently died.  Patient has upcoming appointment with Pharmacist and SW. Update 10/26/20:  Patient has appointment today for knee and ankle.    Patient verbalizes understanding of instructions provided today.   The Managed Medicaid care management team will reach out to the patient again over the next 30 days.  The  Patient has been provided with contact information for the Managed Medicaid care management team and has been advised to call with any health related questions or concerns.   [name] RN, BSN [place]  [facility] - Managed Medicaid High Risk [phone].    Following is a copy of your plan of care:  Patient Care Plan: General Plan of Care (Adult)     Problem Identified: Health Promotion or Disease Self-Management (General Plan of Care)   Priority: Medium  Onset Date: 04/23/2020     Problem Identified: Barriers to Treatment  Priority: High  Onset Date: 04/23/2020     Long-Range Goal: Barriers to Treatment Identified and Managed   Start Date: 05/03/2020  Expected End Date: 11/30/2020  Recent Progress: On track  Priority: Medium  Note:   CARE PLAN ENTRY Medicaid Managed Care (see longitudinal plan of care for additional care plan information)  Current Barriers:  Transportation barriers-patient utilizing [facility] and [employer]  as needed. Limited access to caregiver-patient needs PCS, currently not receiving services she is expecting. Patient choosing not to use aides provided to her-discussed finding another agency with patient. Update 10/26/20:  Patient states aide is coming M-F 10-12-"Lucy"  Nurse Clinical Goal(s):  Over the next 30 days, patient will work with [employer] to address needs related to transportation. Update 05/12/20:  Patient has used [employer] and is very pleased. Over the next 90 days, patient will attend all scheduled medical appointments. RNCM will make referral to SW for assistance with PCS Update 05/20/20:  Patient states she is now receiving PCS from [facility] 3 days a week for 4 hours each day. Update 05/27/20: BSW spoke with patient about PCS, she stated someone usually comes in 3 days a week for a few hours. She stated her aide was off today. BSW contacted [place] and spoke with [facility], she stated that patient has 13 hours per week with 6 days a week, however patient chooses to have an aide come out 3 days a week so that she can have more hours. The aide assist with most ADL's. Update 06/04/20: Patient stated she is ready to move to [facility]. She has already started the process but wants to save some money first and she has to wait on her nephew who will drive the uhaul truck. Update 06/24/20: BSW contacted patient to follow up on her moving to [facility], she stated she is still saving money. Patient did cut call short because she was sleeping, patient stated she was not in need of any resources at this time. Update 08/27/20: Bsw received information from [facility] about patient needing some batteries for her remote. BSW contacted patient to see if any resources were needed. Patient stated she contacted [employer] and they informed her it would cost 40 dollars to replace her remote, patient states she only needs batteries. BSW asked  if patient had anyone to call that could bring her some batteries, patient stated no. She states her daughter is mad at her right now and working so she does not talk to her, patient states she has not family to call. BSW asked if her aide could help, patient states she has not had an aide in about a week, she had to let her last aide go because she smelled like marijuana and they have not replaced her. BSW contacted patients healthcare agency [facility] at [phone] and left a voicemail. Update 09/30/20:  Patient has aides available to her at [facility] but chooses not to use them. Discussion with [facility] and they continue to look for another aide for Michele Owens. Discussed with patient about her finding a new agency. 10/05/20: BSW received a telephone call from [facility] with patient on the line. Patient stated that she went to the ED and her eye was still hurting from a fall. Patient stated she has the information for her eye doctor and was getting ready to contact them. Patient stated she has not recieieved an aide since last Thursday.  Update 10/26/20:  Patient now has aide coming M-F 10-12, "Lucy"  Interventions: Inter-disciplinary care team collaboration (see longitudinal plan of care) Referred patient to community resources care guide team for assistance with transportation. Provided patient with information about [facility] and [employer]. Discussed plans with patient for ongoing care management follow up and provided patient with direct contact information for care management team Advised patient to call [place] or [employer]. Collaborated with [employer] and [place] regarding transportation. Assisted patient/caregiver with obtaining information about health plan benefits Collaborated with Social Work regarding PCS. Update 08/30/20:  Patient continues to have issues with PCS-SW is investigating. Update 06/14/20:  Patient  continues to utilize [employer], aide, [employer] as needed-has missed appointments due to transportation "no shows"-will continue to follow. Update 08/30/20:  patient has been successful in attending appointments with [facility] and [facility] transportation. Update 09/30/20:  Patient has upcoming appointments with MM Pharmacist and SW. Patient Self Care Activities:  Patient will call [place]. Update 06/07/20:  Patient continues to utilize [facility] with success. Update 06/14/20:  Patient has missed appointments due to transportation no shows.  [facility] transportation has been utilized as needed. Update 09/30/20:  Patient with no transportation problems right now-uses [employer] or [place] as needed for appointments with success.     Patient Care Plan: General Plan of Care (Adult)     Problem Identified: Health Promotion or Disease Self-Management (General Plan of Care)   Priority: High  Onset Date: 04/23/2020     Long-Range Goal: Self-Management Plan Developed   Start Date: 04/23/2020  Expected End Date: 11/30/2020  Recent Progress: On track  Priority: Medium  Note:   Current Barriers:  Chronic Disease Management support and education needs.  Patient with abdominal wall infection-seen at [facility] ED 05/24/20 - I&D performed. Update 06/01/20:  Patient states she is improving-HHRN is cleaning and dressing infection. Update 06/07/20:  Infection continues to improve per patient, HHRN continues to clean and dress infection site. Update 06/17/20:  Infection improved, healed. Update 10/26/20:  Patient with recent fall injuring eye and ankle-patient states eye is healing and she is seeing provider today about ankle and knee.  Nurse Case Manager Clinical Goal(s):  Over the next 30 days, patient will attend all scheduled medical appointments:  Over the next 30 days, patient will work with CM team pharmacist to review medications. Update 05/20/20:   Patient has appointment with Pharmacist 05/21/20. Update 05/25/20:  Patient declines to speak with Pharmacist at this time. Update 08/30/20:  Patient is working with Pharmacist.  Interventions:  Inter-disciplinary care team collaboration (see longitudinal plan of care) Evaluation of current treatment plan and patient's adherence to plan as established by provider. Reviewed medications with patient. Collaborated with pharmacist regarding medications. Discussed plans with patient for ongoing care management follow up and provided patient with direct contact information for care management team Reviewed scheduled/upcoming provider appointments.  Update 05/20/20:  Patient unable to attend PT appointment 05/19/20, has next appointment 05/26/20.  Has Cardiology appointment 05/28/20.  Reviewed [place] information with patient again and with aide. Update 06/07/20:  Patient has PT appointment 06/09/20. Update 06/17/20:  patient attended PT appointment 06/16/20 and also attended appointment at [facility] this week-patient states she feels better. Pharmacy referral for medication review. Update 05/24/20: Pharmacist has attempted to speak with patient and has been unable to.  Patient at Urgent Care today with stomach pain.  RNCM will reschedule appointment with patient and speak with her regarding importance of speaking  with Pharmacist. Update 05/25/20: Patient declines to speak with Pharmacist at this time. Update 08/30/20:  Patient is working with Pharmacist for medication management. Update 09/30/20:  Patient without complaint today, has upcoming appointments with MM Pharmacist and SW.  Patient Goals/Self-Care Activities Over the next 30 days, patient will:  -Attends all scheduled provider appointments Calls pharmacy for medication refills Calls provider office for new concerns or questions  Follow Up Plan: The Managed Medicaid care management team will reach out to the patient again over the  next 30 days.  The patient has been provided with contact information for the Managed Medicaid care management team and has been advised to call with any health related questions or concerns.

## 2020-10-27 DIAGNOSIS — J449 Chronic obstructive pulmonary disease, unspecified: Secondary | ICD-10-CM | POA: Diagnosis not present

## 2020-10-27 DIAGNOSIS — I959 Hypotension, unspecified: Secondary | ICD-10-CM | POA: Diagnosis not present

## 2020-10-27 DIAGNOSIS — Z9181 History of falling: Secondary | ICD-10-CM | POA: Diagnosis not present

## 2020-10-27 DIAGNOSIS — E11621 Type 2 diabetes mellitus with foot ulcer: Secondary | ICD-10-CM | POA: Diagnosis not present

## 2020-10-29 ENCOUNTER — Other Ambulatory Visit: Payer: Self-pay

## 2020-10-29 ENCOUNTER — Encounter (HOSPITAL_BASED_OUTPATIENT_CLINIC_OR_DEPARTMENT_OTHER): Payer: Medicaid Other | Attending: Internal Medicine | Admitting: Internal Medicine

## 2020-10-29 DIAGNOSIS — Z86718 Personal history of other venous thrombosis and embolism: Secondary | ICD-10-CM | POA: Insufficient documentation

## 2020-10-29 DIAGNOSIS — I509 Heart failure, unspecified: Secondary | ICD-10-CM | POA: Diagnosis not present

## 2020-10-29 DIAGNOSIS — Z87891 Personal history of nicotine dependence: Secondary | ICD-10-CM | POA: Insufficient documentation

## 2020-10-29 DIAGNOSIS — Z883 Allergy status to other anti-infective agents status: Secondary | ICD-10-CM | POA: Diagnosis not present

## 2020-10-29 DIAGNOSIS — Z7689 Persons encountering health services in other specified circumstances: Secondary | ICD-10-CM | POA: Diagnosis not present

## 2020-10-29 DIAGNOSIS — Z833 Family history of diabetes mellitus: Secondary | ICD-10-CM | POA: Diagnosis not present

## 2020-10-29 DIAGNOSIS — Z91030 Bee allergy status: Secondary | ICD-10-CM | POA: Insufficient documentation

## 2020-10-29 DIAGNOSIS — E11621 Type 2 diabetes mellitus with foot ulcer: Secondary | ICD-10-CM

## 2020-10-29 DIAGNOSIS — I110 Hypertensive heart disease with heart failure: Secondary | ICD-10-CM | POA: Insufficient documentation

## 2020-10-29 DIAGNOSIS — Z886 Allergy status to analgesic agent status: Secondary | ICD-10-CM | POA: Diagnosis not present

## 2020-10-29 DIAGNOSIS — S91301A Unspecified open wound, right foot, initial encounter: Secondary | ICD-10-CM | POA: Diagnosis not present

## 2020-10-29 NOTE — Progress Notes (Signed)
ZANNA, ELS ([id number]) Visit Report for 10/29/2020 Abuse/Suicide Risk Screen Details Patient Name: Date of Service: Michele Owens, Michele Owens 10/29/2020 7:30 A M Medical Record Number: [id number] Patient Account Number: [id number] Date of Birth/Sex: Treating RN: 22-Jan-1961 (60 y.o. Debby Bud Primary Care Anchor Dwan: Gala Romney Other Clinician: Referring Jeronimo Hellberg: Treating Shoshanah Dapper/Extender: Lambert Keto Weeks in Treatment: 0 Abuse/Suicide Risk Screen Items Answer ABUSE RISK SCREEN: Has anyone close to you tried to hurt or harm you recentlyo No Do you feel uncomfortable with anyone in your familyo No Has anyone forced you do things that you didnt want to doo No Electronic Signature(s) Signed: 10/29/2020 2:18:48 PM By: Deon Pilling Entered By: Deon Pilling on 10/29/2020 07:55:47 -------------------------------------------------------------------------------- Activities of Daily Living Details Patient Name: Date of Service: TRUTH, ELFMAN 10/29/2020 7:30 [place] Record Number: [id number] Patient Account Number: [id number] Date of Birth/Sex: Treating RN: 01-17-1961 (60 y.o. Helene Shoe, Tammi Klippel Primary Care Mickeal Daws: Gala Romney Other Clinician: Referring Edgerrin Correia: Treating Chole Driver/Extender: Lambert Keto Weeks in Treatment: 0 Activities of Daily Living Items Answer Activities of Daily Living (Please select one for each item) Drive Automobile Not Able T Medications ake Completely Able Use T elephone Completely Able Care for Appearance Completely Able Use T oilet Completely Able Bath / Shower Completely Able Dress Self Completely Able Feed Self Completely Able Walk Need Assistance Get In / Out Bed Completely [place] for Self Need Assistance Electronic Signature(s) Signed: 10/29/2020 2:18:48 PM By: Deon Pilling Entered By: Deon Pilling on  10/29/2020 08:01:05 -------------------------------------------------------------------------------- Education Screening Details Patient Name: Date of Service: TENEKA, BATON 10/29/2020 7:30 A M Medical Record Number: [id number] Patient Account Number: [id number] Date of Birth/Sex: Treating RN: 19-Aug-1960 (60 y.o. Helene Shoe, Tammi Klippel Primary Care Bhavya Eschete: Gala Romney Other Clinician: Referring Orli Degrave: Treating Durenda Pechacek/Extender: Lamont Snowball in Treatment: 0 Primary Learner Assessed: Patient Learning Preferences/Education Level/Primary Language Learning Preference: Explanation, Demonstration, Printed Material Highest Education Level: High School Preferred Language: English Cognitive Barrier Language Barrier: No Translator Needed: No Memory Deficit: No Emotional Barrier: No Cultural/Religious Beliefs Affecting Medical Care: No Physical Barrier Impaired Vision: Yes Glasses Impaired Hearing: No Decreased Hand dexterity: No Knowledge/Comprehension Knowledge Level: Medium Comprehension Level: Medium Ability to understand written instructions: Medium Ability to understand verbal instructions: Medium Motivation Anxiety Level: Calm Cooperation: Cooperative Education Importance: Acknowledges Need Interest in Health Problems: Asks Questions Perception: Coherent Willingness to Engage in Self-Management Medium Activities: Readiness to Engage in Self-Management Medium Activities: Electronic Signature(s) Signed: 10/29/2020 2:18:48 PM By: Deon Pilling Entered By: Deon Pilling on 10/29/2020 07:59:21 -------------------------------------------------------------------------------- Fall Risk Assessment Details Patient Name: Date of Service: CASSIDEE, HOFFA 10/29/2020 7:30 [place] Record Number: [id number] Patient Account Number: [id number] Date of Birth/Sex: Treating RN: [birth year] (60 y.o. Helene Shoe, Meta.Reding Primary Care Davyon Fisch: Gala Romney Other Clinician: Referring Tolulope Pinkett: Treating Grover Robinson/Extender: Lambert Keto Weeks in Treatment: 0 Fall Risk Assessment Items Have you had 2 or more falls in the last 12 monthso 0 Yes Have you had any fall that resulted in injury in the last 12 monthso 0 Yes FALLS RISK SCREEN History of falling - immediate or within 3 months 25 Yes Secondary diagnosis (Do you have 2 or more medical diagnoseso) 0 No Ambulatory aid None/bed rest/wheelchair/nurse 0 No Crutches/cane/walker 15 Yes Furniture 0 No Intravenous therapy Access/Saline/Heparin Lock 0 No Gait/Transferring Normal/ bed rest/ wheelchair 0 Yes Weak (short steps with or without shuffle, stooped but able  to lift head while walking, may seek 10 Yes support from furniture) Impaired (short steps with shuffle, may have difficulty arising from chair, head down, impaired 0 No balance) Mental Status Oriented to own ability 0 Yes Electronic Signature(s) Signed: 10/29/2020 2:18:48 PM By: Deon Pilling Entered By: Deon Pilling on 10/29/2020 08:00:04 -------------------------------------------------------------------------------- Foot Assessment Details Patient Name: Date of Service: DEMERIA, LADINO 10/29/2020 7:30 A M Medical Record Number: [id number] Patient Account Number: [id number] Date of Birth/Sex: Treating RN: [birth year] (60 y.o. Debby Bud Primary Care Kyleigh Nannini: Gala Romney Other Clinician: Referring Deandrea Vanpelt: Treating Maveryk Renstrom/Extender: Lambert Keto Weeks in Treatment: 0 Foot Assessment Items Site Locations + = Sensation present, - = Sensation absent, C = Callus, U = Ulcer R = Redness, W = Warmth, M = Maceration, PU = Pre-ulcerative lesion F = Fissure, S = Swelling, D = Dryness Assessment Right: Left: Other Deformity: No No Prior Foot Ulcer: No No Prior Amputation: No No Charcot Joint: No No Ambulatory Status: Ambulatory With Help Assistance Device:  Cane Gait: Unsteady Notes uses a wheelchair mostly. Electronic Signature(s) Signed: 10/29/2020 2:18:48 PM By: Deon Pilling Entered By: Deon Pilling on 10/29/2020 08:14:44 -------------------------------------------------------------------------------- Nutrition Risk Screening Details Patient Name: Date of Service: ALZENA, MERKLING 10/29/2020 7:30 A M Medical Record Number: [id number] Patient Account Number: [id number] Date of Birth/Sex: Treating RN: 07/05/1960 (59 y.o. Debby Bud Primary Care Jordyan Hardiman: Gala Romney Other Clinician: Referring Kayl Stogdill: Treating Esaias Cleavenger/Extender: Lambert Keto Weeks in Treatment: 0 Height (in): Weight (lbs): Body Mass Index (BMI): Nutrition Risk Screening Items Score Screening NUTRITION RISK SCREEN: I have an illness or condition that made me change the kind and/or amount of food I eat 2 Yes I eat fewer than two meals per day 0 No I eat few fruits and vegetables, or milk products 0 No I have three or more drinks of beer, liquor or wine almost every day 0 No I have tooth or mouth problems that make it hard for me to eat 0 No I don't always have enough money to buy the food I need 0 No I eat alone most of the time 0 No I take three or more different prescribed or over-the-counter drugs a day 1 Yes Without wanting to, I have lost or gained 10 pounds in the last six months 0 No I am not always physically able to shop, cook and/or feed myself 2 Yes Nutrition Protocols Good Risk Protocol Provide education on elevated blood Moderate Risk Protocol 0 sugars and impact on wound healing, as applicable High Risk Proctocol Risk Level: Moderate Risk Score: 5 Electronic Signature(s) Signed: 10/29/2020 2:18:48 PM By: Deon Pilling Entered By: Deon Pilling on 10/29/2020 08:00:16

## 2020-10-29 NOTE — Progress Notes (Signed)
Michele Owens, Michele Owens ([id number]) Visit Report for 10/29/2020 Allergy List Details Patient Name: Date of Service: Michele Owens, Michele Owens 10/29/2020 7:30 A M Medical Record Number: [id number] Patient Account Number: [id number] Date of Birth/Sex: Treating RN: 1960-06-25 (60 y.o. Debby Bud Primary Care Rockne Dearinger: Gala Romney Other Clinician: Referring Marvena Tally: Treating Maxie Slovacek/Extender: Lambert Keto Weeks in Treatment: 0 Allergies Active Allergies bee venom protein (honey bee) Reaction: swelling Severity: Severe Fioricet Reaction: N/V, rash Severity: Moderate ibuprofen Reaction: rash Severity: Moderate Lamisil Reaction: rash NSAIDS (Non-Steroidal Anti-Inflammatory Drug) Reaction: unsure- says MD order Allergy Notes Electronic Signature(s) Signed: 10/29/2020 2:18:48 PM By: Deon Pilling Entered By: Deon Pilling on 10/29/2020 07:53:10 -------------------------------------------------------------------------------- Arrival Information Details Patient Name: Date of Service: Michele Owens 10/29/2020 7:30 A M Medical Record Number: [id number] Patient Account Number: [id number] Date of Birth/Sex: Treating RN: [birth year] (60 y.o. Debby Bud Primary Care Louise Victory: Gala Romney Other Clinician: Referring Annika Selke: Treating Madysin Crisp/Extender: Lamont Snowball in Treatment: 0 Visit Information Patient Arrived: Wheel Chair Arrival Time: 07:44 Accompanied By: self Transfer Assistance: None Patient Identification Verified: Yes Secondary Verification Process Completed: Yes Patient Requires Transmission-Based Precautions: No Patient Has Alerts: No Electronic Signature(s) Signed: 10/29/2020 2:18:48 PM By: Deon Pilling Entered By: Deon Pilling on 10/29/2020 07:48:02 -------------------------------------------------------------------------------- Clinic Level of Care Assessment Details Patient Name: Date of Service: Michele Owens, Michele Owens  10/29/2020 7:30 [place] Record Number: [id number] Patient Account Number: [id number] Date of Birth/Sex: Treating RN: 03/16/1961 (60 y.o. [name] Primary Care Geovanny Sartin: Gala Romney Other Clinician: Referring Duha Abair: Treating Shawn Carattini/Extender: Lambert Keto Weeks in Treatment: 0 Clinic Level of Care Assessment Items TOOL 1 Quantity Score '[]'$  - 0 Use when EandM and Procedure is performed on INITIAL visit ASSESSMENTS - Nursing Assessment / Reassessment X- 1 20 General Physical Exam (combine w/ comprehensive assessment (listed just below) when performed on new pt. evals) X- 1 25 Comprehensive Assessment (HX, ROS, Risk Assessments, Wounds Hx, etc.) ASSESSMENTS - Wound and Skin Assessment / Reassessment '[]'$  - 0 Dermatologic / Skin Assessment (not related to wound area) ASSESSMENTS - Ostomy and/or Continence Assessment and Care '[]'$  - 0 Incontinence Assessment and Management '[]'$  - 0 Ostomy Care Assessment and Management (repouching, etc.) PROCESS - Coordination of Care X - Simple Patient / Family Education for ongoing care 1 15 '[]'$  - 0 Complex (extensive) Patient / Family Education for ongoing care X- 1 10 Staff obtains Programmer, systems, Records, T Results / Process Orders est '[]'$  - 0 Staff telephones HHA, Nursing Homes / Clarify orders / etc '[]'$  - 0 Routine Transfer to another Facility (non-emergent condition) '[]'$  - 0 Routine Hospital Admission (non-emergent condition) X- 1 15 New Admissions / Biomedical engineer / Ordering NPWT Apligraf, etc. , '[]'$  - 0 Emergency Hospital Admission (emergent condition) PROCESS - Special Needs '[]'$  - 0 Pediatric / Minor Patient Management '[]'$  - 0 Isolation Patient Management '[]'$  - 0 Hearing / Language / Visual special needs '[]'$  - 0 Assessment of Community assistance (transportation, D/C planning, etc.) '[]'$  - 0 Additional assistance / Altered mentation '[]'$  - 0 Support Surface(s) Assessment (bed, cushion, seat,  etc.) INTERVENTIONS - Miscellaneous '[]'$  - 0 External ear exam '[]'$  - 0 Patient Transfer (multiple staff / Civil Service fast streamer / Similar devices) '[]'$  - 0 Simple Staple / Suture removal (25 or less) '[]'$  - 0 Complex Staple / Suture removal (26 or more) '[]'$  - 0 Hypo/Hyperglycemic Management (do not check if billed separately) X- 1 15 Ankle / Brachial Index (ABI) - do not check if  billed separately Has the patient been seen at the hospital within the last three years: Yes Total Score: 100 Level Of Care: New/Established - Level 3 Electronic Signature(s) Signed: 10/29/2020 1:37:28 PM By: Baruch Gouty RN, BSN Entered By: Baruch Gouty on 10/29/2020 08:43:07 -------------------------------------------------------------------------------- Lower Extremity Assessment Details Patient Name: Date of Service: Michele Owens, Michele Owens 10/29/2020 7:30 A M Medical Record Number: [id number] Patient Account Number: [id number] Date of Birth/Sex: Treating RN: 1960/09/02 (60 y.o. Helene Shoe, Meta.Reding Primary Care Emillio Ngo: Gala Romney Other Clinician: Referring Florita Nitsch: Treating Minh Jasper/Extender: Lambert Keto Weeks in Treatment: 0 Edema Assessment Assessed: [Left: No] [Right: Yes] Edema: [Left: Ye] [Right: s] Calf Left: Right: Point of Measurement: 27 cm From Medial Instep 41 cm Ankle Left: Right: Point of Measurement: 8 cm From Medial Instep 25 cm Knee To Floor Left: Right: From Medial Instep 40 cm Vascular Assessment Pulses: Dorsalis Pedis Palpable: [Right:Yes] Doppler Audible: [Right:Yes] Posterior Tibial Palpable: [Right:Yes] Doppler Audible: [Right:Yes] Blood Pressure: Brachial: [Right:106] Ankle: [Right:Dorsalis Pedis: 96 0.91] Electronic Signature(s) Signed: 10/29/2020 2:18:48 PM By: Deon Pilling Entered By: Deon Pilling on 10/29/2020 08:13:41 -------------------------------------------------------------------------------- Multi Wound Chart Details Patient Name: Date  of Service: Michele Owens 10/29/2020 7:30 A M Medical Record Number: [id number] Patient Account Number: [id number] Date of Birth/Sex: Treating RN: January 04, 1961 (60 y.o. 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 Primary Etiology: Extremity Asthma, Chronic Obstructive N/A N/A Comorbid History: Pulmonary Disease (COPD), Congestive Heart Failure, Deep Vein Thrombosis, Hypertension, Type II Diabetes, Gout, Osteoarthritis 10/22/2020 N/A N/A Date Acquired: 0 N/A N/A Weeks of Treatment: Open N/A N/A Wound Status: 0.5x0.8x0.1 N/A N/A Measurements L x W x D (cm) 0.314 N/A N/A A (cm) : rea 0.031 N/A N/A Volume (cm) : 0.00% N/A N/A % Reduction in A rea: 0.00% N/A N/A % Reduction in Volume: Unable to visualize wound bed N/A N/A Classification: Medium N/A N/A Exudate A mount: Serosanguineous N/A N/A Exudate Type: red, brown N/A N/A Exudate Color: Distinct, outline attached N/A N/A Wound Margin: None Present (0%) N/A N/A Granulation A mount: Large (67-100%) N/A N/A Necrotic A mount: Fat Layer (Subcutaneous Tissue): Yes N/A N/A Exposed Structures: Fascia: No Tendon: No Muscle: No Joint: No Bone: No None N/A N/A Epithelialization: Debridement - Excisional N/A N/A Debridement: Pre-procedure Verification/Time Out 08:40 N/A N/A Taken: Lidocaine 5% topical ointment N/A N/A Pain Control: Subcutaneous, Slough N/A N/A Tissue Debrided: Skin/Subcutaneous Tissue N/A N/A Level: 0.4 N/A  N/A Debridement A (sq cm): rea Curette N/A N/A Instrument: Minimum N/A N/A Bleeding: Pressure N/A N/A Hemostasis A chieved: 4 N/A N/A Procedural Pain: 2 N/A N/A Post Procedural Pain: Procedure was tolerated well N/A N/A Debridement Treatment Response: 0.5x0.8x0.1 N/A N/A Post Debridement Measurements L x W x D (cm) 0.031 N/A N/A Post Debridement Volume: (cm) Debridement N/A N/A Procedures Performed: Treatment Notes Electronic Signature(s) Signed: 10/29/2020 9:08:47 AM By: Kalman Shan DO Signed: 10/29/2020 1:37:28 PM By: Baruch Gouty RN, BSN Entered By: Kalman Shan on 10/29/2020 08:57:12 -------------------------------------------------------------------------------- Multi-Disciplinary Care Plan Details Patient Name: Date of Service: Michele Owens 10/29/2020 7:30 A M Medical Record Number: [id number] Patient Account Number: [id number] Date of Birth/Sex: Treating RN: [birth year] (60 y.o. F)  Baruch Gouty Primary Care Josph Norfleet: Gala Romney Other Clinician: Referring Mikaila Grunert: Treating Latash Nouri/Extender: Lamont Snowball in Treatment: 0 Multidisciplinary Care Plan reviewed with physician Active Inactive Abuse / Safety / Falls / Self Care Management Nursing Diagnoses: Potential for falls Goals: Patient/caregiver will verbalize/demonstrate measures taken to prevent injury and/or falls Date Initiated: 10/29/2020 Target Resolution Date: 11/26/2020 Goal Status: Active Interventions: Assess fall risk on admission and as needed Assess impairment of mobility on admission and as needed per policy Notes: Nutrition Nursing Diagnoses: Impaired glucose control: actual or potential Potential for alteratiion in Nutrition/Potential for imbalanced nutrition Goals: Patient/caregiver will maintain therapeutic glucose control Date Initiated: 10/29/2020 Target Resolution Date: 11/26/2020 Goal Status: Active Interventions: Assess HgA1c  results as ordered upon admission and as needed Assess patient nutrition upon admission and as needed per policy Provide education on elevated blood sugars and impact on wound healing Treatment Activities: Patient referred to Primary Care Physician for further nutritional evaluation : 10/29/2020 Notes: Wound/Skin Impairment Nursing Diagnoses: Impaired tissue integrity Knowledge deficit related to ulceration/compromised skin integrity Goals: Patient/caregiver will verbalize understanding of skin care regimen Date Initiated: 10/29/2020 Target Resolution Date: 11/26/2020 Goal Status: Active Ulcer/skin breakdown will have a volume reduction of 30% by week 4 Date Initiated: 10/29/2020 Target Resolution Date: 11/26/2020 Goal Status: Active Interventions: Assess patient/caregiver ability to obtain necessary supplies Assess patient/caregiver ability to perform ulcer/skin care regimen upon admission and as needed Assess ulceration(s) every visit Provide education on ulcer and skin care Treatment Activities: Skin care regimen initiated : 10/29/2020 Topical wound management initiated : 10/29/2020 Notes: Electronic Signature(s) Signed: 10/29/2020 1:37:28 PM By: Baruch Gouty RN, BSN Entered By: Baruch Gouty on 10/29/2020 08:41:27 -------------------------------------------------------------------------------- Pain Assessment Details Patient Name: Date of Service: Michele Owens, Michele Owens 10/29/2020 7:30 A M Medical Record Number: [id number] Patient Account Number: [id number] Date of Birth/Sex: Treating RN: Feb 28, 1961 (60 y.o. Debby Bud Primary Care Ronelle Michie: Gala Romney Other Clinician: Referring Dominque Marlin: Treating Oktober Glazer/Extender: Lambert Keto Weeks in Treatment: 0 Active Problems Location of Pain Severity and Description of Pain Patient Has Paino Yes Site Locations Pain Location: Pain in Ulcers Rate the pain. Current Pain Level: 9 Worst Pain Level:  10 Least Pain Level: 0 Tolerable Pain Level: 8 Pain Management and Medication Current Pain Management: Medication: No Cold Application: No Rest: No Massage: No Activity: No T.E.N.S.: No Heat Application: No Leg drop or elevation: No Is the Current Pain Management Adequate: Adequate How does your wound impact your activities of daily livingo Sleep: No Bathing: No Appetite: No Relationship With Others: No Bladder Continence: No Emotions: No Bowel Continence: No Work: No Toileting: No Drive: No Dressing: No Hobbies: No Electronic Signature(s) Signed: 10/29/2020 2:18:48 PM By: Deon Pilling Entered By: Deon Pilling on 10/29/2020 08:17:02 -------------------------------------------------------------------------------- Patient/Caregiver Education Details Patient Name: Date of Service: Michele Owens 7/22/2022andnbsp7:30 [place] Record Number: [id number] Patient Account Number: [id number] Date of Birth/Gender: Treating RN: 09/14/1960 (60 y.o. [name] Primary Care Physician: Gala Romney Other Clinician: Referring Physician: Treating Physician/Extender: Lamont Snowball in Treatment: 0 Education Assessment Education Provided To: Patient Education Topics Provided Elevated Blood Sugar/ Impact on Healing: Methods: Explain/Verbal Responses: Reinforcements needed, State content correctly Wound/Skin Impairment: Methods: Explain/Verbal Responses: Reinforcements needed, State content correctly Electronic Signature(s) Signed: 10/29/2020 1:37:28 PM By: Baruch Gouty RN, BSN Entered By: Baruch Gouty on 10/29/2020 08:42:17 -------------------------------------------------------------------------------- Wound Assessment Details Patient Name: Date of Service: Michele Owens, Michele Owens 10/29/2020 7:30 A M Medical Record Number: [id number] Patient Account Number: [id number] Date  of Birth/Sex: Treating RN: [birth year] (60 y.o. [name] Primary Care Ganesh Deeg: Gala Romney Other Clinician: Referring Lanette Ell: Treating Delio Slates/Extender: Lambert Keto Weeks in Treatment: 0 Wound Status Wound Number: 1 Primary Diabetic Wound/Ulcer of the Lower Extremity Etiology: Wound Location: Right, Medial Foot Wound Open Wounding Event: Trauma Status: Date Acquired: 10/22/2020 Comorbid Asthma, Chronic Obstructive Pulmonary Disease (COPD), Weeks Of Treatment: 0 History: Congestive Heart Failure, Deep Vein Thrombosis, Hypertension, Clustered Wound: No Type II Diabetes, Gout, Osteoarthritis Photos Wound Measurements Length: (cm) 0.5 Width: (cm) 0.8 Depth: (cm) 0.1 Area: (cm) 0.314 Volume: (cm) 0.031 % Reduction in Area: 0% % Reduction in Volume: 0% Epithelialization: None Tunneling: No Undermining: No Wound Description Classification: Unable to visualize wound bed Wound Margin: Distinct, outline attached Exudate Amount: Medium Exudate Type: Serosanguineous Exudate Color: red, brown Foul Odor After Cleansing: No Slough/Fibrino Yes Wound Bed Granulation Amount: None Present (0%) Exposed Structure Necrotic Amount: Large (67-100%) Fascia Exposed: No Necrotic Quality: Adherent Slough Fat Layer (Subcutaneous Tissue) Exposed: Yes Tendon Exposed: No Muscle Exposed: No Joint Exposed: No Bone Exposed: No Electronic Signature(s) Signed: 10/29/2020 1:37:28 PM By: Baruch Gouty RN, BSN Signed: 10/29/2020 2:18:48 PM By: Deon Pilling Entered By: Deon Pilling on 10/29/2020 08:15:37 -------------------------------------------------------------------------------- [place] Details Patient Name: Date of Service: Michele Owens 10/29/2020 7:30 A M Medical Record Number: [id number] Patient Account Number: [id number] Date of Birth/Sex: Treating RN: 23-Oct-1960 (60 y.o. Helene Shoe, Tammi Klippel Primary Care Velecia Ovitt: Gala Romney Other Clinician: Referring Tawn Fitzner: Treating Zelma Snead/Extender: Lambert Keto Weeks in Treatment: 0 Vital Signs Time Taken: 07:50 Temperature (F): 98.5 Height (in): 65 Pulse (bpm): 83 Source: Stated Respiratory Rate (breaths/min): 20 Weight (lbs): 306 Blood Pressure (mmHg): 106/71 Source: Stated Capillary Blood Glucose (mg/dl): 127 Body Mass Index (BMI): 50.9 Reference Range: 80 - 120 mg / dl Airway Pulse Oximetry (%): 4 Electronic Signature(s) Signed: 10/29/2020 2:18:48 PM By: Deon Pilling Entered By: Deon Pilling on 10/29/2020 08:01:59

## 2020-10-31 IMAGING — CT CT HEAD W/O CM
3 series · 15 of 47 positions shown, 18 images · non-contrast
Comparison: 04/24/2015

CLINICAL DATA: Pain starting at the top of the head and going to
the back. History of migraines. History of hypertension, diabetes,
thyroid cancer.

EXAM:
CT HEAD WITHOUT CONTRAST
TECHNIQUE: Contiguous axial images were obtained from the base of the skull
through the vertex without intravenous contrast.

[Series 2: head wo · axial · 0.47mm/px · z∈[+1420,+1545]mm · 9 of 30 slices shown, 12 images]
[im 3/30  brain]
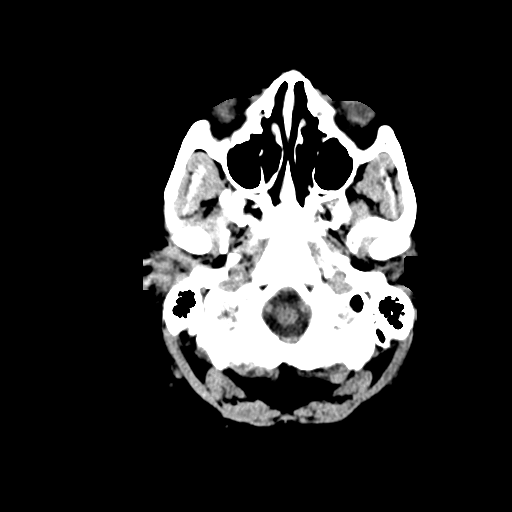
[im 3/30  bone]
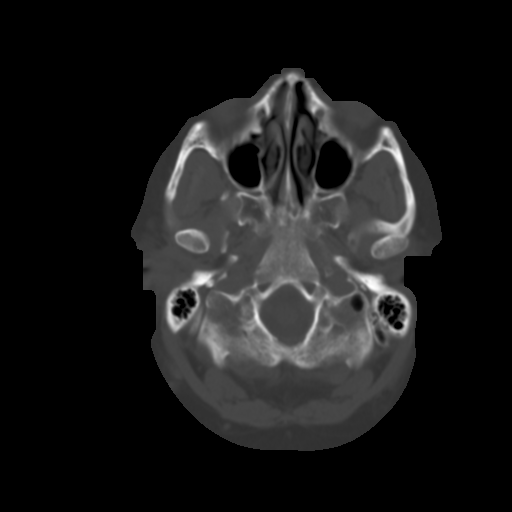
[im 6/30  brain]
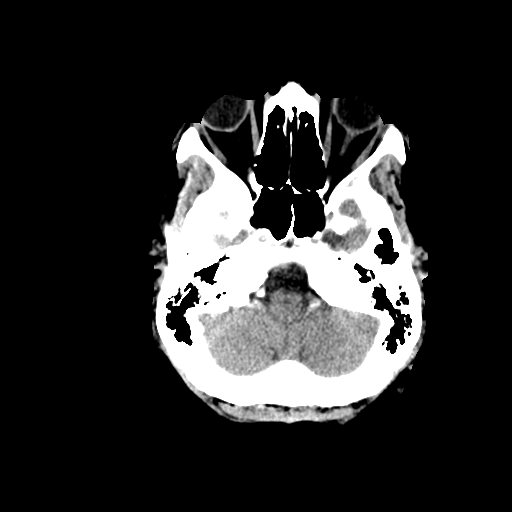
[im 9/30  brain]
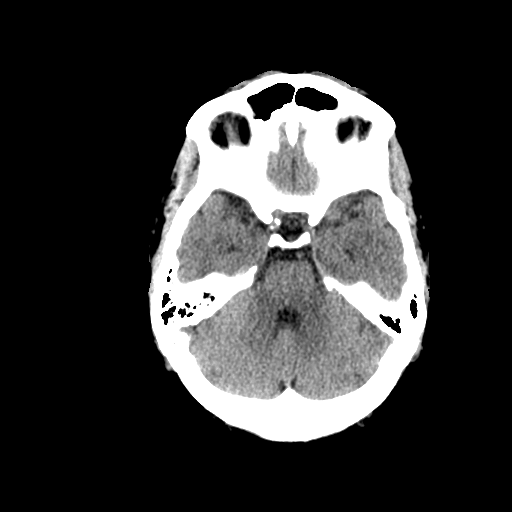
[im 12/30  brain]
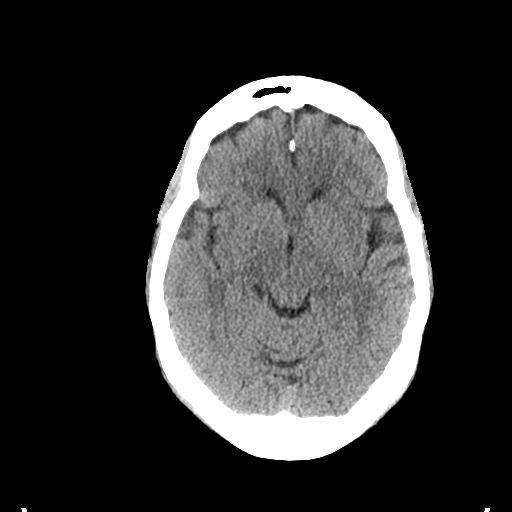
[im 16/30  brain]
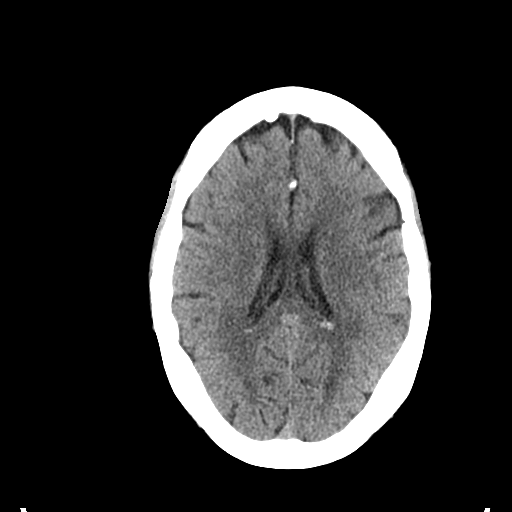
[im 16/30  bone]
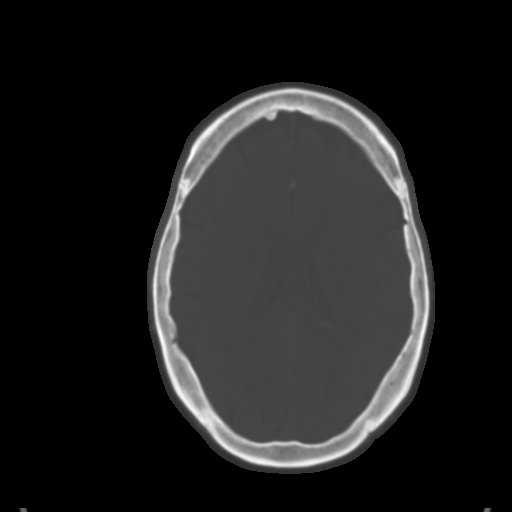
[im 19/30  brain]
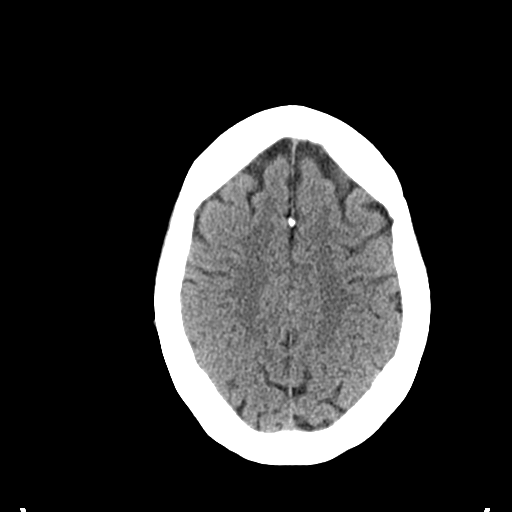
[im 22/30  brain]
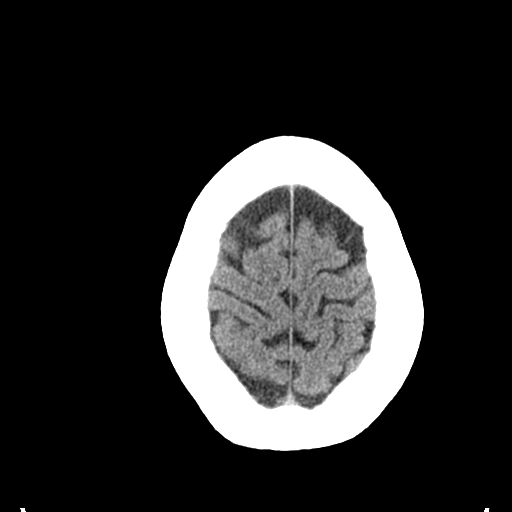
[im 25/30  brain]
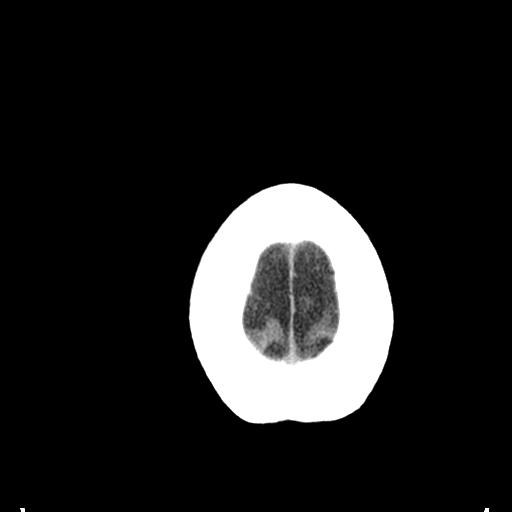
[im 28/30  brain]
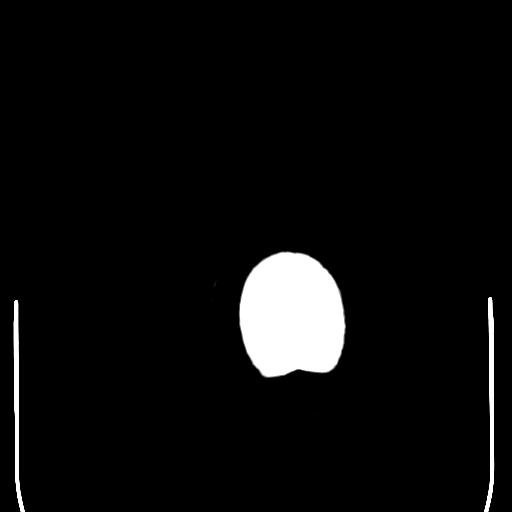
[im 28/30  bone]
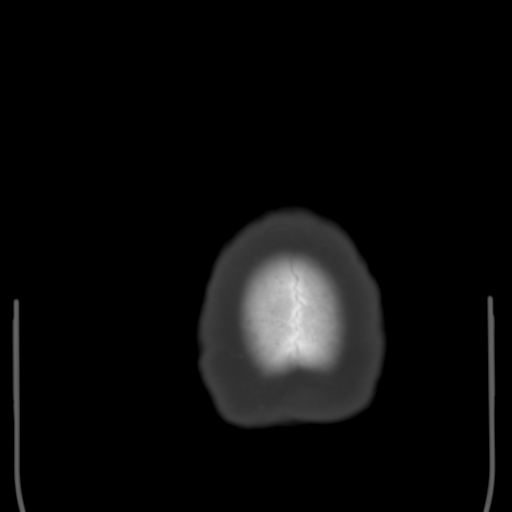

[Series 4: coronal soft tissue · coronal · 0.27mm/px · 3 of 62 slices shown]
[im 21/62  brain]
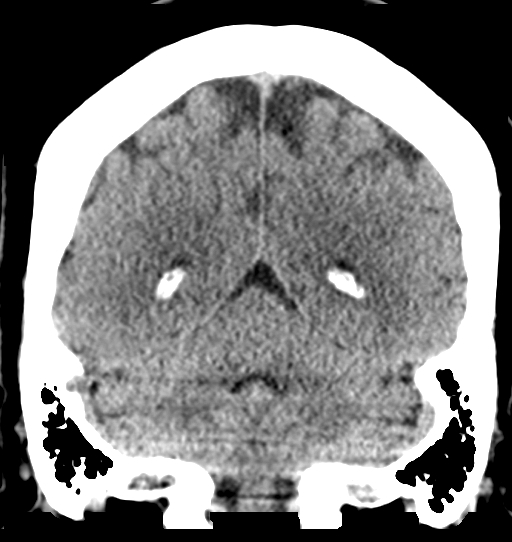
[im 28/62  brain]
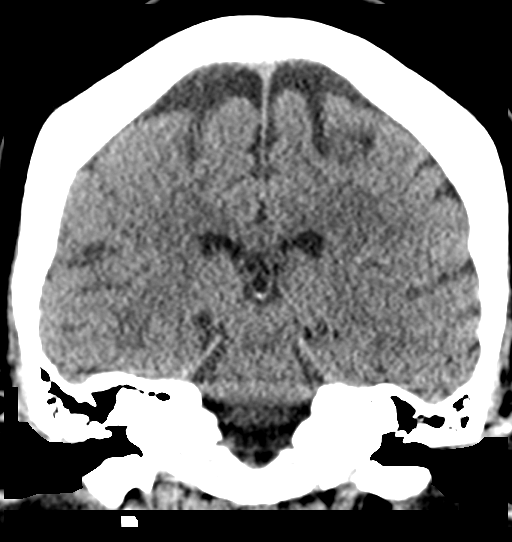
[im 34/62  brain]
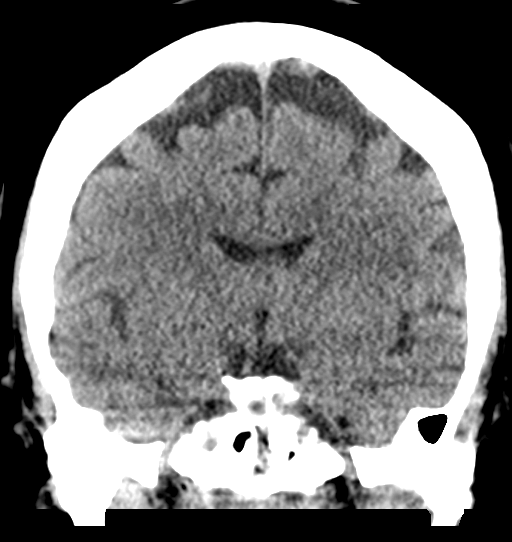

[Series 5: sagittal soft tissue · sagittal · 0.28mm/px · 3 of 47 slices shown]
[im 16/47  brain]
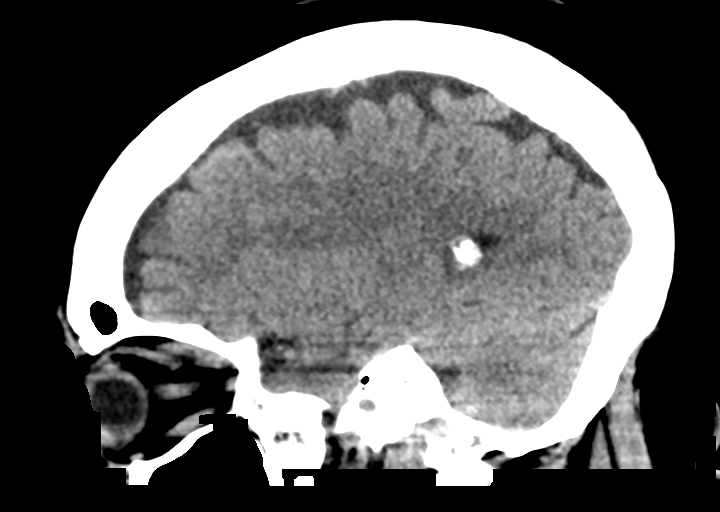
[im 24/47  brain]
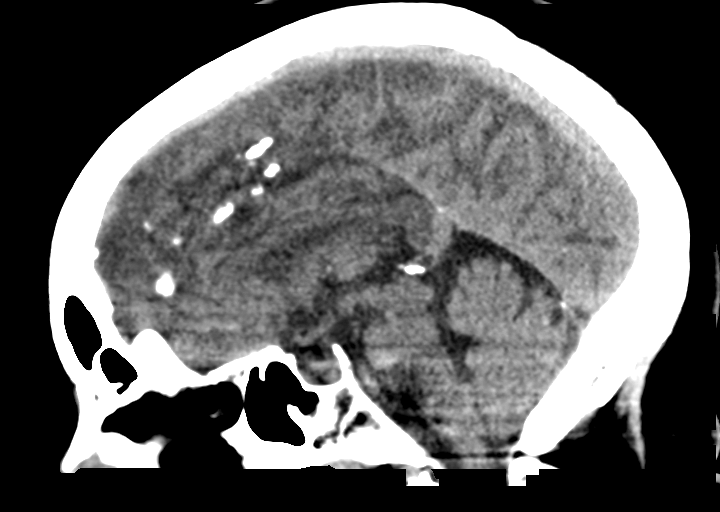
[im 31/47  brain]
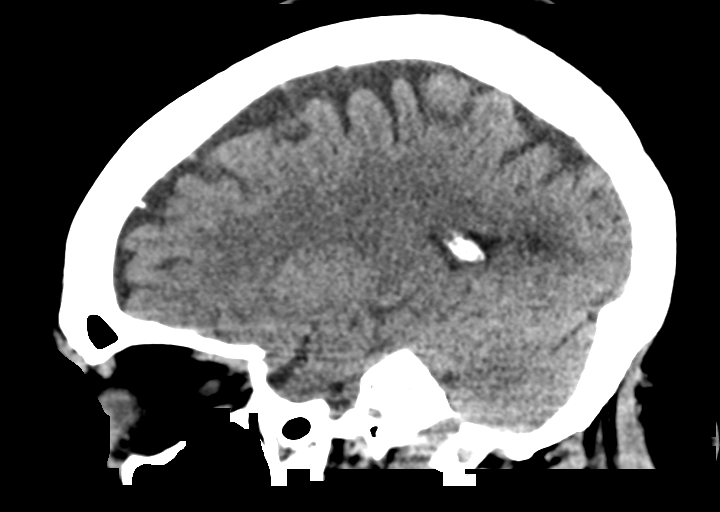

[15 of 47 positions shown; findings below may reference images not displayed]

FINDINGS: Brain: No evidence of acute infarction, hemorrhage, hydrocephalus,
extra-axial collection or mass lesion/mass effect.

Vascular: Mild intracranial arterial vascular calcifications.

Skull: Normal. Negative for fracture or focal lesion.

Sinuses/Orbits: No acute finding.

Other: None.
IMPRESSION: No acute intracranial abnormality.

## 2020-11-01 ENCOUNTER — Other Ambulatory Visit: Payer: Self-pay

## 2020-11-01 DIAGNOSIS — I110 Hypertensive heart disease with heart failure: Secondary | ICD-10-CM | POA: Diagnosis not present

## 2020-11-01 NOTE — Patient Outreach (Signed)
Care Coordination  11/01/2020  Yarima Jandt [birth year] [id number]  RNCM returned patient's phone call-no answer, left message.  [name] RN, BSN [place]  [facility] - Managed Medicaid High Risk [phone].

## 2020-11-01 NOTE — Progress Notes (Signed)
CYLIE, DENYS ([id number]) Visit Report for 10/29/2020 Chief Complaint Document Details Patient Name: Date of Service: Michele Owens, Michele Owens 10/29/2020 7:30 A M Medical Record Number: [id number] Patient Account Number: [id number] Date of Birth/Sex: Treating RN: [birth year] (60 y.o. Michele Owens Primary Care Provider: Gala Romney Other Clinician: Referring Provider: Treating Provider/Extender: Lambert Keto Weeks in Treatment: 0 Information Obtained from: Patient Chief Complaint Right foot wound Electronic Signature(s) Signed: 10/29/2020 9:08:47 AM By: Kalman Shan DO Entered By: Kalman Shan on 10/29/2020 08:57:37 -------------------------------------------------------------------------------- Debridement Details Patient Name: Date of Service: Michele Owens 10/29/2020 7:30 A M Medical Record Number: [id number] Patient Account Number: [id number] Date of Birth/Sex: Treating RN: 09/18/1960 (60 y.o. Michele Owens Primary Care Provider: Gala Romney Other Clinician: Referring Provider: Treating Provider/Extender: Lambert Keto Weeks in Treatment: 0 Debridement Performed for Assessment: Wound #1 Right,Medial Foot Performed By: Physician Kalman Shan, DO Debridement Type: Debridement Severity of Tissue Pre Debridement: Fat layer exposed Level of Consciousness (Pre-procedure): Awake and Alert Pre-procedure Verification/Time Out Yes - 08:40 Taken: Start Time: 08:40 Pain Control: Lidocaine 5% topical ointment T Area Debrided (L x W): otal 0.5 (cm) x 0.8 (cm) = 0.4 (cm) Tissue and other material debrided: Viable, Non-Viable, Slough, Subcutaneous, Slough Level: Skin/Subcutaneous Tissue Debridement Description: Excisional Instrument: Curette Bleeding: Minimum Hemostasis Achieved: Pressure End Time: 08:42 Procedural Pain: 4 Post Procedural Pain: 2 Response to Treatment: Procedure was tolerated well Level of Consciousness  (Post- Awake and Alert procedure): Post Debridement Measurements of Total Wound Length: (cm) 0.5 Width: (cm) 0.8 Depth: (cm) 0.1 Volume: (cm) 0.031 Character of Wound/Ulcer Post Debridement: Requires Further Debridement Severity of Tissue Post Debridement: Fat layer exposed Post Procedure Diagnosis Same as Pre-procedure Electronic Signature(s) Signed: 10/29/2020 9:08:47 AM By: Kalman Shan DO Signed: 10/29/2020 1:37:28 PM By: Baruch Gouty RN, BSN Entered By: Baruch Gouty on 10/29/2020 08:44:48 -------------------------------------------------------------------------------- HPI Details Patient Name: Date of Service: Michele Owens 10/29/2020 7:30 A M Medical Record Number: [id number] Patient Account Number: [id number] Date of Birth/Sex: Treating RN: Mar 14, 1961 (60 y.o. Michele Owens Primary Care Provider: Gala Romney Other Clinician: Referring Provider: Treating Provider/Extender: Lambert Keto Weeks in Treatment: 0 History of Present Illness HPI Description: Admission 7/22 Ms. Michele Owens is a 60 year old female with a past medical history of controlled type 2 diabetes on oral agents, essential hypertension, COPD on chronic 4 L O2 via an [place] that presents the clinic for a 2 week history of nonhealing wound to the right medial foot. Patient states that she was out with her mother walking when she fell and landed on her foot landed on a rock. She required assistance from the pizza delivery man to get back up. She visited the ED for this issue. She had imaging of her right foot that showed no acute fracture or dislocation. She currently denies pain. She denies increased warmth erythema or drainage from the wound. Electronic Signature(s) Signed: 10/29/2020 9:08:47 AM By: Kalman Shan DO Entered By: Kalman Shan on 10/29/2020 09:03:08 -------------------------------------------------------------------------------- Physical Exam  Details Patient Name: Date of Service: Michele Owens, Michele Owens 10/29/2020 7:30 A M Medical Record Number: [id number] Patient Account Number: [id number] Date of Birth/Sex: Treating RN: 1960-11-16 (60 y.o. Michele Owens Primary Care Provider: Gala Romney Other Clinician: Referring Provider: Treating Provider/Extender: Lambert Keto Weeks in Treatment: 0 Constitutional respirations regular, non-labored and within target range for patient.. Cardiovascular 2+ dorsalis pedis/posterior tibialis pulses. Psychiatric pleasant and cooperative. Notes Right foot: Medial aspect with an open wound with fibrotic adherent  debris. No signs of infection. 2+ pitting edema to the knee Bilaterally Electronic Signature(s) Signed: 10/29/2020 9:08:47 AM By: Kalman Shan DO Signed: 10/29/2020 9:08:47 AM By: Kalman Shan DO Entered By: Kalman Shan on 10/29/2020 09:07:33 -------------------------------------------------------------------------------- Physician Orders Details Patient Name: Date of Service: Michele Owens, Michele Owens 10/29/2020 7:30 A M Medical Record Number: [id number] Patient Account Number: [id number] Date of Birth/Sex: Treating RN: 07-06-1960 (61 y.o. Michele Owens Primary Care Provider: Gala Romney Other Clinician: Referring Provider: Treating Provider/Extender: Lambert Keto Weeks in Treatment: 0 Verbal / Phone Orders: No Diagnosis Coding ICD-10 Coding Code Description E11.621 Type 2 diabetes mellitus with foot ulcer S91.301A Unspecified open wound, right foot, initial encounter Follow-up Appointments Return Appointment in 1 week. Bathing/ Shower/ Hygiene May shower with protection but do not get wound dressing(s) wet. Edema Control - Lymphedema / SCD / Other Right Lower Extremity Elevate legs to the level of the heart or above for 30 minutes daily and/or when sitting, a frequency of: - throughout the day Avoid standing for long  periods of time. Exercise regularly Wound Treatment Wound #1 - Foot Wound Laterality: Right, Medial Peri-Wound Care: Sween Lotion (Moisturizing lotion) 1 x Per Week/30 Days Discharge Instructions: Apply moisturizing lotion as directed Prim Dressing: Hydrofera Blue Ready Foam, 2.5 x2.5 in 1 x Per Week/30 Days ary Discharge Instructions: Apply to wound bed as instructed Prim Dressing: Santyl Ointment 1 x Per Week/30 Days ary Discharge Instructions: Apply nickel thick amount to wound bed as instructed Secondary Dressing: Woven Gauze Sponge, Non-Sterile 4x4 in 1 x Per Week/30 Days Discharge Instructions: Apply over primary dressing as directed. Secondary Dressing: Optifoam Non-Adhesive Dressing, 4x4 in 1 x Per Week/30 Days Discharge Instructions: Apply over primary dressing cut to make foam donut to offload Compression Wrap: Kerlix Roll 4.5x3.1 (in/yd) 1 x Per Week/30 Days Discharge Instructions: Apply Kerlix and Coban compression as directed. Compression Wrap: Coban Self-Adherent Wrap 4x5 (in/yd) 1 x Per Week/30 Days Discharge Instructions: Apply over Kerlix as directed. Electronic Signature(s) Signed: 10/29/2020 9:08:47 AM By: Kalman Shan DO Entered By: Kalman Shan on 10/29/2020 09:07:43 -------------------------------------------------------------------------------- Problem List Details Patient Name: Date of Service: Michele Owens, Michele Owens 10/29/2020 7:30 A M Medical Record Number: [id number] Patient Account Number: [id number] Date of Birth/Sex: Treating RN: 1960-08-01 (60 y.o. Michele Owens Primary Care Provider: Gala Romney Other Clinician: Referring Provider: Treating Provider/Extender: Lambert Keto Weeks in Treatment: 0 Active Problems ICD-10 Encounter Code Description Active Date MDM Diagnosis E11.621 Type 2 diabetes mellitus with foot ulcer 10/29/2020 No Yes S91.301A Unspecified open wound, right foot, initial encounter 10/29/2020 No  Yes Inactive Problems Resolved Problems Electronic Signature(s) Signed: 10/29/2020 9:08:47 AM By: Kalman Shan DO Entered By: Kalman Shan on 10/29/2020 08:56:55 -------------------------------------------------------------------------------- Progress Note Details Patient Name: Date of Service: Michele Owens 10/29/2020 7:30 A M Medical Record Number: [id number] Patient Account Number: [id number] Date of Birth/Sex: Treating RN: 02/09/1961 (60 y.o. Michele Owens Primary Care Provider: Gala Romney Other Clinician: Referring Provider: Treating Provider/Extender: Lambert Keto Weeks in Treatment: 0 Subjective Chief Complaint Information obtained from Patient Right foot wound History of Present Illness (HPI) Admission 7/22 Ms. Michele Owens is a 60 year old female with a past medical history of controlled type 2 diabetes on oral agents, essential hypertension, COPD on chronic 4 L O2 via an [place] that presents the clinic for a 2 week history of nonhealing wound to the right medial foot. Patient states that she was out with her mother walking when she fell and landed  on her foot landed on a rock. She required assistance from the pizza delivery man to get back up. She visited the ED for this issue. She had imaging of her right foot that showed no acute fracture or dislocation. She currently denies pain. She denies increased warmth erythema or drainage from the wound. Patient History Information obtained from Patient. Allergies bee venom protein (honey bee) (Severity: Severe, Reaction: swelling), Fioricet (Severity: Moderate, Reaction: N/V, rash), ibuprofen (Severity: Moderate, Reaction: rash), Lamisil (Reaction: rash), NSAIDS (Non-Steroidal Anti-Inflammatory Drug) (Reaction: unsure- says MD order) Family History Cancer - Father, Diabetes - Mother. Social History Former smoker - quit 07/2016, Alcohol Use - Never, Drug Use - No History, Caffeine Use -  Never. 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 Denies history of Aspiration, Pneumothorax, Sleep Apnea, Tuberculosis Cardiovascular Patient has history of Congestive Heart Failure, Deep Vein Thrombosis, Hypertension Denies history of Angina, Arrhythmia, Coronary Artery Disease, Hypotension, Myocardial Infarction, Peripheral Arterial Disease, Peripheral Venous Disease, Phlebitis, Vasculitis Gastrointestinal Denies history of Cirrhosis , Colitis, Crohnoos, Hepatitis A, Hepatitis B, Hepatitis C Endocrine Patient has history of Type II Diabetes Denies history of Type I Diabetes Genitourinary Denies history of End Stage Renal Disease Immunological Denies history of Lupus Erythematosus, Raynaudoos, Scleroderma Integumentary (Skin) Denies history of History of Burn Musculoskeletal Patient has history of Gout, Osteoarthritis Denies history of Rheumatoid Arthritis, Osteomyelitis Neurologic Denies history of Dementia, Neuropathy, Quadriplegia, Paraplegia, Seizure Disorder Oncologic Denies history of Received Radiation Psychiatric Denies history of Anorexia/bulimia, Confinement Anxiety Patient is treated with Controlled Diet, Oral Agents. Blood sugar is tested. Hospitalization/Surgery History - thyroidectomy-09/2016. Medical A Surgical History Notes nd Oncologic thyroid cancer 2018 Psychiatric anxiety and depression Review of Systems (ROS) Constitutional Symptoms (General Health) Denies complaints or symptoms of Fatigue, Fever, Chills, Marked Weight Change. Eyes Complains or has symptoms of Glasses / Contacts. Denies  complaints or symptoms of Dry Eyes, Vision Changes. Ear/Nose/Mouth/Throat Denies complaints or symptoms of Chronic sinus problems or rhinitis. Respiratory Denies complaints or symptoms of Chronic or frequent coughs, Shortness of Breath. Cardiovascular Denies complaints or symptoms of Chest pain. Gastrointestinal Denies complaints or symptoms of Frequent diarrhea, Nausea, Vomiting. Endocrine Denies complaints or symptoms of Heat/cold intolerance. Genitourinary Denies complaints or symptoms of Frequent urination. Integumentary (Skin) Complains or has symptoms of Wounds - right foot wound. Musculoskeletal Denies complaints or symptoms of Muscle Pain, Muscle Weakness. Neurologic Denies complaints or symptoms of Numbness/parasthesias. Psychiatric Denies complaints or symptoms of Claustrophobia, Suicidal. Objective Constitutional respirations regular, non-labored and within target range for patient.. Vitals Time Taken: 7:50 AM, Height: 65 in, Source: Stated, Weight: 306 lbs, Source: Stated, BMI: 50.9, Temperature: 98.5 F, Pulse: 83 bpm, Respiratory Rate: 20 breaths/min, Blood Pressure: 106/71 mmHg, Capillary Blood Glucose: 127 mg/dl, Pulse Oximetry: 4 %. Cardiovascular 2+ dorsalis pedis/posterior tibialis pulses. Psychiatric pleasant and cooperative. General Notes: Right foot: Medial aspect with an open wound with fibrotic adherent debris. No signs of infection. 2+ pitting edema to the knee Bilaterally Integumentary (Hair, Skin) Wound #1 status is Open. Original cause of wound was Trauma. The date acquired was: 10/22/2020. The wound is located on the Right,Medial Foot. The wound measures 0.5cm length x 0.8cm width x 0.1cm depth; 0.314cm^2 area and 0.031cm^3 volume. There is Fat Layer (Subcutaneous Tissue) exposed. There is no  tunneling or undermining noted. There is a medium amount of serosanguineous drainage noted. The wound margin is distinct with the outline attached to the wound  base. There is no granulation within the wound bed. There is a large (67-100%) amount of necrotic tissue within the wound bed including Adherent Slough. Assessment Active Problems ICD-10 Type 2 diabetes mellitus with foot ulcer Unspecified open wound, right foot, initial encounter Patient presents with a nonhealing wound to her right medial foot. I question that this has only been going on for 2 weeks due to the appearance of the wound. This looks more of a chronic wound. I tried to debride nonviable tissue. It is adhered tightly. No signs of infection on exam. I recommended doing Santyl and Hydrofera Blue. She has 2+ pitting edema and I think would benefit from a compression wrap. Also this will help with dressing changes since patient states she cannot do these herself and does not have anyone to help her. Procedures Wound #1 Pre-procedure diagnosis of Wound #1 is a Diabetic Wound/Ulcer of the Lower Extremity located on the Right,Medial Foot .Severity of Tissue Pre Debridement is: Fat layer exposed. There was a Excisional Skin/Subcutaneous Tissue Debridement with a total area of 0.4 sq cm performed by Kalman Shan, DO. With the following instrument(s): Curette to remove Viable and Non-Viable tissue/material. Material removed includes Subcutaneous Tissue and Slough and after achieving pain control using Lidocaine 5% topical ointment. No specimens were taken. A time out was conducted at 08:40, prior to the start of the procedure. A Minimum amount of bleeding was controlled with Pressure. The procedure was tolerated well with a pain level of 4 throughout and a pain level of 2 following the procedure. Post Debridement Measurements: 0.5cm length x 0.8cm width x 0.1cm depth; 0.031cm^3 volume. Character of Wound/Ulcer Post Debridement requires further debridement. Severity of Tissue Post Debridement is: Fat layer exposed. Post procedure Diagnosis Wound #1: Same as Pre-Procedure Plan Follow-up  Appointments: Return Appointment in 1 week. Bathing/ Shower/ Hygiene: May shower with protection but do not get wound dressing(s) wet. Edema Control - Lymphedema / SCD / Other: Elevate legs to the level of the heart or above for 30 minutes daily and/or when sitting, a frequency of: - throughout the day Avoid standing for long periods of time. Exercise regularly WOUND #1: - Foot Wound Laterality: Right, Medial Peri-Wound Care: Sween Lotion (Moisturizing lotion) 1 x Per Week/30 Days Discharge Instructions: Apply moisturizing lotion as directed Prim Dressing: Hydrofera Blue Ready Foam, 2.5 x2.5 in 1 x Per Week/30 Days ary Discharge Instructions: Apply to wound bed as instructed Prim Dressing: Santyl Ointment 1 x Per Week/30 Days ary Discharge Instructions: Apply nickel thick amount to wound bed as instructed Secondary Dressing: Woven Gauze Sponge, Non-Sterile 4x4 in 1 x Per Week/30 Days Discharge Instructions: Apply over primary dressing as directed. Secondary Dressing: Optifoam Non-Adhesive Dressing, 4x4 in 1 x Per Week/30 Days Discharge Instructions: Apply over primary dressing cut to make foam donut to offload Com pression Wrap: Kerlix Roll 4.5x3.1 (in/yd) 1 x Per Week/30 Days Discharge Instructions: Apply Kerlix and Coban compression as directed. Com pression Wrap: Coban Self-Adherent Wrap 4x5 (in/yd) 1 x Per Week/30 Days Discharge Instructions: Apply over Kerlix as directed. 1. In office sharp debridement 2. Hydrofera Blue and Santyl under Kerlix/Coban 3. Follow-up in 1 week Electronic Signature(s) Signed: 10/29/2020 9:08:47 AM By: Kalman Shan DO Signed: 10/29/2020 9:08:47 AM By: Kalman Shan DO Entered By: Kalman Shan on 10/29/2020 09:07:58 -------------------------------------------------------------------------------- HxROS Details Patient Name: Date of  Service: Michele Owens, Michele Owens 10/29/2020 7:30 A M Medical Record Number: [id number] Patient Account Number:  [id number] Date of Birth/Sex: Treating RN: Feb 10, 1961 (60 y.o. Michele Owens Primary Care Provider: Gala Romney Other Clinician: Referring Provider: Treating Provider/Extender: Lambert Keto Weeks in Treatment: 0 Information Obtained From Patient Constitutional Symptoms (General Health) Complaints and Symptoms: Negative for: Fatigue; Fever; Chills; Marked Weight Change Eyes Complaints and Symptoms: Positive for: Glasses / Contacts Negative for: Dry Eyes; Vision Changes Medical History: Negative for: Cataracts; Glaucoma; Optic Neuritis Ear/Nose/Mouth/Throat Complaints and Symptoms: Negative for: Chronic sinus problems or rhinitis Medical History: Negative for: Chronic sinus problems/congestion; Middle ear problems Respiratory Complaints and Symptoms: Negative for: Chronic or frequent coughs; Shortness of Breath Medical History: Positive for: Asthma; 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 Negative for: Aspiration; Pneumothorax; Sleep Apnea; Tuberculosis Cardiovascular Complaints and Symptoms: Negative for: Chest pain Medical History: Positive for: Congestive Heart Failure; Deep Vein Thrombosis; Hypertension Negative for: Angina; Arrhythmia; Coronary Artery Disease; Hypotension; Myocardial Infarction; Peripheral Arterial Disease; Peripheral Venous Disease; Phlebitis; Vasculitis Gastrointestinal Complaints and Symptoms: Negative for: Frequent diarrhea; Nausea; Vomiting Medical History: Negative for: Cirrhosis ; Colitis; Crohns; Hepatitis A; Hepatitis B; Hepatitis C Endocrine Complaints and Symptoms: Negative for: Heat/cold intolerance Medical History: Positive for: Type II Diabetes Negative for: Type I Diabetes Time with diabetes: unsure Treated with: Oral agents, Diet Blood sugar tested every day: Yes Tested : daily Genitourinary Complaints and Symptoms: Negative for: Frequent urination Medical  History: Negative for: End Stage Renal Disease Integumentary (Skin) Complaints and Symptoms: Positive for: Wounds - right foot wound Medical History: Negative for: History of Burn Musculoskeletal Complaints and Symptoms: Negative for: Muscle Pain; Muscle Weakness Medical History: Positive for: Gout; Osteoarthritis Negative for: Rheumatoid Arthritis; Osteomyelitis Neurologic Complaints and Symptoms: Negative for: Numbness/parasthesias Medical History: Negative for: Dementia; Neuropathy; Quadriplegia; Paraplegia; Seizure Disorder Psychiatric Complaints and Symptoms: Negative for: Claustrophobia; Suicidal Medical History: Negative for: Anorexia/bulimia; Confinement Anxiety Past Medical History Notes: anxiety and depression Hematologic/Lymphatic Medical History: Negative for: Anemia; Hemophilia; Human Immunodeficiency Virus; Lymphedema; Sickle Cell Disease Immunological Medical History: Negative for: Lupus Erythematosus; Raynauds; Scleroderma Oncologic Medical History: Negative for: Received Radiation Past Medical History Notes: thyroid cancer 2018 Immunizations Implantable Devices No devices added Hospitalization / Surgery History Type of Hospitalization/Surgery thyroidectomy-09/2016 Family and Social History Cancer: Yes - Father; Diabetes: Yes - Mother; Former smoker - quit 07/2016; Alcohol Use: Never; Drug Use: No History; Caffeine Use: Never; Financial Concerns: No; Food, Clothing or Shelter Needs: No; Support System Lacking: No; Transportation Concerns: No Electronic Signature(s) Signed: 10/29/2020 9:08:47 AM By: Kalman Shan DO Signed: 10/29/2020 2:18:48 PM By: Deon Pilling Entered By: Deon Pilling on 10/29/2020 08:08:14 -------------------------------------------------------------------------------- SuperBill Details Patient Name: Date of Service: Michele Owens, Michele Owens 10/29/2020 Medical Record Number: [id number] Patient Account Number: [id number] Date of  Birth/Sex: Treating RN: 1960/07/20 (60 y.o. Michele Owens Primary Care Provider: Gala Romney Other Clinician: Referring Provider: Treating Provider/Extender: Lambert Keto Weeks in Treatment: 0 Diagnosis Coding ICD-10 Codes Code Description E11.621 Type 2 diabetes mellitus with foot ulcer S91.301A Unspecified open wound, right foot, initial encounter Facility Procedures CPT4 Code: [id number] Description: [facility] VISIT-LEV 3 EST PT Modifier: 25 Quantity: 1 CPT4 Code: [id number] Description: [id number] - DEB SUBQ TISSUE 20 SQ CM/< ICD-10 Diagnosis Description E11.621 Type 2 diabetes mellitus with foot ulcer S91.301A Unspecified open wound, right foot, initial encounter Modifier: Quantity: 1 Physician Procedures : CPT4 Code Description Modifier [id number] WC PHYS LEVEL 3 NEW PT ICD-10 Diagnosis Description  E11.621 Type 2 diabetes mellitus with foot ulcer S91.301A Unspecified open wound, right foot, initial encounter Quantity: 1 : [id number] - WC PHYS SUBQ TISS 20 SQ CM ICD-10 Diagnosis Description E11.621 Type 2 diabetes mellitus with foot ulcer S91.301A Unspecified open wound, right foot, initial encounter Quantity: 1 Electronic Signature(s) Signed: 10/29/2020 3:17:49 PM By: Kalman Shan DO Signed: 11/01/2020 4:44:37 PM By: Baruch Gouty RN, BSN Previous Signature: 10/29/2020 9:08:47 AM Version By: Kalman Shan DO Entered By: Baruch Gouty on 10/29/2020 14:18:41

## 2020-11-02 DIAGNOSIS — I110 Hypertensive heart disease with heart failure: Secondary | ICD-10-CM | POA: Diagnosis not present

## 2020-11-03 DIAGNOSIS — I110 Hypertensive heart disease with heart failure: Secondary | ICD-10-CM | POA: Diagnosis not present

## 2020-11-04 ENCOUNTER — Other Ambulatory Visit: Payer: Self-pay

## 2020-11-04 DIAGNOSIS — I110 Hypertensive heart disease with heart failure: Secondary | ICD-10-CM | POA: Diagnosis not present

## 2020-11-04 NOTE — Patient Outreach (Addendum)
Medicaid Managed Care    Pharmacy Note  11/04/2020 Name: Michele Owens MRN: [id number] DOB: [birth year]  Michele Owens is a 60 y.o. year old female who is a primary care patient of [name], MD. The [facility] Managed Care Coordination team was consulted for assistance with disease management and care coordination needs.    Engaged with patient Engaged with patient by telephone for follow up visit in response to referral for case management and/or care coordination services.  Michele Owens was given information about Managed Medicaid Care Coordination team services today. Michele Owens agreed to services and verbal consent obtained.   Objective:  Lab Results  Component Value Date   CREATININE 0.57 10/19/2020   CREATININE 0.70 10/03/2020   CREATININE 0.77 10/03/2020    Lab Results  Component Value Date   HGBA1C 6.2 (H) 04/19/2020       Component Value Date/Time   CHOL 226 (H) 08/05/2017 0318   TRIG 59 08/05/2017 0318   HDL 80 08/05/2017 0318   CHOLHDL 2.8 08/05/2017 0318   VLDL 12 08/05/2017 0318   LDLCALC 134 (H) 08/05/2017 0318    Other: (TSH, CBC, Vit D, etc.)  Clinical ASCVD: No  The ASCVD Risk score [name], et al., 2013) failed to calculate for the following reasons:   Cannot find a previous HDL lab   Cannot find a previous total cholesterol lab    Other: (CHADS2VASc if Afib, PHQ9 if depression, MMRC or CAT for COPD, ACT, DEXA)  BP Readings from Last 3 Encounters:  10/19/20 (!) 107/51  10/19/20 (!) 113/53  10/04/20 136/81    Assessment/Interventions: Review of patient past medical history, allergies, medications, health status, including review of consultants reports, laboratory and other test data, was performed as part of comprehensive evaluation and provision of chronic care management services.   PCP -Looking at [place], I didn't see any f/u appts scheduled and since she's been to the ER 5x/month I was hoping she could get an appt with the PCP  instead of going to the ER constantly Plan: Called PCP and spoke with [facility], the practice is independent and they have seen her this month (10/27/20).    HTN Carvedilol 6.43m BID Lisinopril 542mQD Plan: At goal,  patient stable/ symptoms controlled   Edema Torsemide 2064m2AM and 2Noon) KCl Plan: At goal,  patient stable/ symptoms controlled   DM Lab Results  Component Value Date   HGBA1C 6.2 (H) 04/19/2020   HGBA1C 6.5 (H) 04/11/2020   HGBA1C 6.6 (H) 07/09/2018   Lab Results  Component Value Date   LDLCALC 134 (H) 08/05/2017   CREATININE 0.57 10/19/2020    Lab Results  Component Value Date   NA 139 10/19/2020   K 4.2 10/19/2020   CREATININE 0.57 10/19/2020   GFRNONAA >60 10/19/2020   GFRAA >60 09/30/2019   GLUCOSE 101 (H) 10/19/2020    Metformin 500m71mBID May 2022: Patient has been having issues with testing, unable to use new monitor. Coordinated with [name]), and OffiGlass blower/designerPCP'BlueLinxice who did a home visit to show patient how to use test strips this week. Called patient today and she is having no issues testing now. This was the main thing we went over today June 2022: Sugar readings are 105 111 125 100 July 2022: Called patient for visit but she stated, "I'm getting my hair done, I can't talk now. But [name] two sugar pills in my packs. Can you  look into it?." Typically, [place] has been very detailed and helpful in the past. I called and spoke with Diedee and she will reach out to the patient in an hour.  Lipids Lab Results  Component Value Date   CHOL 226 (H) 08/05/2017   CHOL 167 09/23/2016   Lab Results  Component Value Date   HDL 80 08/05/2017   HDL 39 (L) 09/23/2016   Lab Results  Component Value Date   LDLCALC 134 (H) 08/05/2017   LDLCALC 93 09/23/2016   Lab Results  Component Value Date   TRIG 59 08/05/2017   TRIG 176 (H) 09/23/2016   TRIG 388 (H) 07/22/2016   Lab Results   Component Value Date   CHOLHDL 2.8 08/05/2017   CHOLHDL 4.3 09/23/2016   No results found for: LDLDIRECT  Rosuvastatin 16m Plan: Needs repeat labs but patient isn't enthused about scheduling F/U   Pain Left Foot:  Pain Score:  May 2022: 8/17 September 2020: 9/10 Oxycodone 10/325 Cyclobenzaprine March 2022: Missed PT appointment and now can't go back (Per patient). June 2022: Still hasn't re-scheduled PT  Insomnia Trazodone 1048m-patient not sleeping due to stress of her son passing June 2022: Patient stated isn't sleeping because there are people outside her house making noise   Gout Allopurinol 10014mStates has gout attack daily but then described OA pain and how her bones rub against each other in her knees and hips March 2022: Ask for Uric Acid Labs April 2022: No labs in yet  Thyroid Levothyroxine 112m72mQD April 2022: Patient hasn't had meds in 3 weeks. Called Pharmacy and they will deliver today May 2022: Patient is compliant and taking meds as directed Plan: At goal,  patient stable/ symptoms controlled   SDOH (Social Determinants of Health) assessments and interventions performed:    Care Plan  Allergies  Allergen Reactions   Bee Venom Swelling and Other (See Comments)    "All over my body" (swelling)   Fioricet [Butalbital-Apap-Caffeine] Nausea And Vomiting and Rash   Ibuprofen Rash and Other (See Comments)    Severe rash   Lamisil [Terbinafine] Rash and Other (See Comments)    Pt states this causes her to "feel funny"   Nsaids Other (See Comments)    Per MD's orders     Medications Reviewed Today     Reviewed by CrafGayla Medicus (Registered Nurse) on 10/26/20 at 0928[place] Status: <None>   Medication Order Taking? Sig Documenting Provider Last Dose Status Informant  albuterol (PROVENTIL HFA;VENTOLIN HFA) 108 (90 Base) MCG/ACT inhaler [id number] 1-2 puffs into the lungs every 6 (six) hours as needed for wheezing or shortness of  breath. LongMargette Fast 08/13/2020 Unknown time Active Self           Med Note (ATKRosine Beat 27, 2022  8:43 PM) LF 2[name]/2022 [facility] per external pharmacy records   allopurinol (ZYLOPRIM) 100 MG tablet [id number] 100 mg by mouth daily. [provider] 08/13/2020 Unknown time Active Self           Med Note (ATKOrvan SeenATHER L   Mon Oct 04, 2020  8:30 PM) #30 filled 09/10/2020 [facility] per external pharmacy records   benzonatate (TESSALON) 200 MG capsule [id number] 1 capsule (200 mg total) by mouth in the morning, at Owens, and at bedtime. 7 DS Regalado, Belkys A, MD 08/13/2020 Unknown time Active Self  Med Note Rosine Beat Oct 04, 2020  8:30 PM) #21 filled 09/27/2020 [facility] per external pharmacy records  Blood Glucose Monitoring Suppl (ACCU-CHEK AVIVA PLUS) w/Device KIT [id number] No 1 each by Other route daily.  [provider] unk unk Active Self  butalbital-acetaminophen-caffeine (FIORICET) 50-325-40 MG tablet [id number] No Take 1 tablet by mouth 2 (two) times daily as needed for migraine. [provider] Past Month Unknown time Active Self           Med Note Rosine Beat Oct 04, 2020  8:31 PM) #60/30 days filled 09/10/2020 [facility] per external pharmacy records   calcitRIOL (ROCALTROL) 0.5 MCG capsule [id number] No Take 1 capsule (0.5 mcg total) by mouth daily. Modena Jansky, MD 08/13/2020 Unknown time Active Self           Med Note Rosine Beat Oct 04, 2020  8:31 PM) #30 filled 09/10/2020 [facility] per external pharmacy records   carvedilol (COREG) 6.25 MG tablet [id number] No Take 1 tablet (6.25 mg total) by mouth 2 (two) times daily with a meal. Bensimhon, [name], MD 08/13/2020 3 am Active Self           Med Note Payton Doughty   Mon Oct 04, 2020  8:31 PM) #60 filled 09/10/2020 [facility] per external pharmacy records   clonazePAM (KLONOPIN) 2 MG tablet  [id number] No Take 1 tablet (2 mg total) by mouth 3 (three) times daily. for anxiety  Patient taking differently: Take 2 mg by mouth 3 (three) times daily.   [name], MD 08/13/2020 Unknown time Active            Med Note Rosine Beat Oct 04, 2020  8:25 PM) #90/30 days filled 09/07/2020 [facility] per PMP AWARE  cyclobenzaprine (FLEXERIL) 10 MG tablet [id number] No Take 10 mg by mouth 3 (three) times daily. [provider] 08/13/2020 Unknown time Active Self           Med Note Orvan Seen, HEATHER L   Mon Oct 04, 2020  8:32 PM) #90/30 days filled 09/10/2020 [facility] per external pharmacy records   diclofenac sodium (VOLTAREN) 1 % GEL [id number] No Apply 2 g topically 4 (four) times daily as needed (for pain).  Patient not taking: No sig reported   [name], MD Not Taking Unknown time Active Self  EPINEPHrine 0.3 mg/0.3 mL IJ SOAJ injection [id number] No Inject 0.3 mg into the muscle once as needed for anaphylaxis. [provider] unk last dose Active Self           Med Note Rosine Beat Oct 04, 2020  8:41 PM) LF 07/20/2020 [facility] per external pharmacy records   FEROSUL 325 (65 Fe) MG tablet [id number] No Take 325 mg by mouth daily. [provider] 08/13/2020 Unknown time Active Self           Med Note Rosine Beat Oct 04, 2020  8:32 PM) #30 filled 09/10/2020 [facility] per external pharmacy records   glucose blood test strip [id number] No Use as instructed to check blood sugar up to 4 times daily  Patient taking differently: 1 each by Other route See admin instructions. Use as instructed to check blood sugar up to 4 times daily   [name], DO unk unk Active Self  guaiFENesin (MUCINEX) 600 MG 12 hr tablet [id number] No  Take 2 tablets (1,200 mg total) by mouth 2 (two) times daily. Regalado, Belkys A, MD 08/13/2020 Unknown time Active Self  ipratropium-albuterol (DUONEB) 0.5-2.5 (3) MG/3ML SOLN [id number] No Take  3 mLs by nebulization 4 (four) times daily.  [provider] 08/12/2020 Unknown time Active Self           Med Note Rosine Beat Oct 04, 2020  8:43 PM) LF 05/17/2020 [facility] per external pharmacy records   levothyroxine (SYNTHROID, LEVOTHROID) 112 MCG tablet [id number] No Take 224 mcg by mouth daily before breakfast. [provider] 08/13/2020 Unknown time Active Self           Med Note Orvan Seen, HEATHER L   Mon Oct 04, 2020  8:33 PM) #60/30 days filled 09/10/2020 [facility] per external pharmacy records   lisinopril (PRINIVIL,ZESTRIL) 5 MG tablet [id number] No Take 1 tablet (5 mg total) by mouth daily. Please call for office visit [phone]  Patient taking differently: Take 5 mg by mouth daily.   [name], MD 08/13/2020 Unknown time Active            Med Note Rosine Beat Oct 04, 2020  8:33 PM) #30 filled 09/10/2020 [facility] per external pharmacy records   loperamide (IMODIUM) 2 MG capsule [id number] No Take 1 capsule (2 mg total) by mouth 4 (four) times daily as needed for diarrhea or loose stools. [name], MD Past Month Unknown time Active Self  metFORMIN (GLUCOPHAGE) 500 MG tablet [id number] No Take 1 tablet (500 mg total) by mouth 2 (two) times daily with a meal. [name], MD 08/13/2020 Unknown time Active Self           Med Note Payton Doughty   Mon Oct 04, 2020  8:34 PM) #60 filled 09/10/2020 [facility] per external pharmacy records   nitroGLYCERIN (NITROSTAT) 0.4 MG SL tablet [id number] No Place 1 tablet (0.4 mg total) under the tongue every 5 (five) minutes as needed for chest pain. [name], MD unk last dose Active Self           Med Note Rosine Beat Oct 04, 2020  8:43 PM) LF 04/28/2020 [facility] per external pharmacy records   nystatin cream (MYCOSTATIN) [id number] No Apply 1 application topically 2 (two) times daily as needed for dry skin. Elmarie Shiley, MD Past Week Unknown  time Active Self           Med Note Rosine Beat Oct 04, 2020  8:42 PM) LF 07/19/2020 [facility] per external pharmacy records   nystatin ointment (MYCOSTATIN) [id number]  Apply 1 application topically 2 (two) times daily. [provider]  Active            Med Note Rosine Beat Oct 04, 2020  8:35 PM) LF 09/10/2020 [facility] per external pharmacy records   omeprazole (PRILOSEC) 20 MG capsule [id number] No Take 20 mg by mouth 2 (two) times daily before a meal. [provider] 08/13/2020 Unknown time Active Self           Med Note Orvan Seen, HEATHER L   Mon Oct 04, 2020  8:35 PM) #60 filled 09/10/2020 [facility] per external pharmacy records   oxyCODONE-acetaminophen (PERCOCET) 10-325 MG tablet [id number] No Take 1 tablet by mouth every 6 (six) hours as needed for pain. [provider] 08/13/2020 Unknown time Active Self  Med Note Rosine Beat Oct 04, 2020  8:25 PM) #120/30 days filled 09/10/2020 [facility] per PMP AWARE  OXYGEN [id number] No Inhale 2-3 L into the lungs continuous. [provider] 08/13/2020 Unknown time Active Self           Med Note Dorina Hoyer, [facility]   Mon Apr 12, 2020 10:08 AM)    oxymetazoline (AFRIN NASAL SPRAY) 0.05 % nasal spray [id number]  Place 1 spray into both nostrils 2 (two) times daily. Pearson Grippe, DO  Active   polyethylene glycol (MIRALAX / GLYCOLAX) 17 g packet [id number] No Take 17 g by mouth daily as needed for mild constipation. [provider] Past Week Unknown time Active Self           Med Note Orvan Seen, Sharlette Dense   Mon Oct 04, 2020  8:30 PM) 238 g filled 09/21/2020 [facility] per external pharmacy records   potassium chloride SA (K-DUR,KLOR-CON) 20 MEQ tablet [id number] No Take 1 tablet (20 mEq total) by mouth 2 (two) times daily. [name], MD 08/13/2020 Unknown time Active Self           Med Note Rosine Beat Oct 04, 2020  8:36 PM) #60  filled 09/10/2020 [facility] per external pharmacy records   rosuvastatin (CRESTOR) 10 MG tablet [id number] No Take 10 mg by mouth every evening. [provider] 08/13/2020 Unknown time Active Self           Med Note Rosine Beat Oct 04, 2020  8:36 PM) #30 filled 09/10/2020 [facility] per external pharmacy records   senna-docusate (SENNA-PLUS) 8.6-50 MG tablet [id number]  Take 1 tablet by mouth daily. [provider]  Active            Med Note Rosine Beat Oct 04, 2020  8:37 PM) #30 filled 09/10/2020 [facility] per external pharmacy records   torsemide (DEMADEX) 20 MG tablet [id number] No TAKE 4 (FOUR) TABLETS BY MOUTH DAILY (2AM+ 2NOON)  Patient taking differently: Take 40 mg by mouth 2 (two) times daily with breakfast and lunch.   [name], MD 08/13/2020 Unknown time Active            Med Note Rosine Beat Oct 04, 2020  8:38 PM) #120/30 days filled 09/10/2020 [facility] per external pharmacy records   traZODone (DESYREL) 50 MG tablet [id number] No Take 100 mg by mouth at bedtime. [provider] 08/12/2020 Unknown time Active Self           Med Note Rosine Beat Oct 04, 2020  8:39 PM) #60 filled 09/10/2020 [facility] per external pharmacy records   triamcinolone cream (KENALOG) 0.1 % [id number]  Apply 1 application topically 2 (two) times daily. [provider]  Active            Med Note Rosine Beat Oct 04, 2020  8:40 PM) LF 09/10/2020 [facility] per external pharmacy records   umeclidinium-vilanterol [facility] ELLIPTA) 62.5-25 MCG/INH AEPB [id number]  Inhale 1 puff into the lungs daily. [provider]  Active            Med Note Rosine Beat Oct 04, 2020  8:41 PM) 30DS filled 09/10/2020 [facility] per external pharmacy records             Patient Active Problem List  Diagnosis Date Noted   Acute on chronic respiratory failure with hypoxia and  hypercapnia (HCC) 04/18/2020   Acute on chronic respiratory failure with hypoxia (HCC) 04/18/2020   Hemoptysis 48/18/5631   Acute metabolic encephalopathy 49/70/2637   Acute encephalopathy 04/18/2020   Chronic diastolic CHF (congestive heart failure) ([place]) 04/18/2020   Sepsis ([place]) 04/18/2020   Morbid obesity with BMI of 50.0-59.9, adult ([place])    Fall at home, initial encounter 06/23/2018   HNP (herniated nucleus pulposus), thoracic 06/23/2018   CHF (congestive heart failure) ([place]) 06/18/2018   Hypertensive disorder 08/28/2017   Obesity 08/28/2017   COPD exacerbation ([place]) 08/04/2017   Acute and chronic respiratory failure with hypercapnia ([place])    Pulmonary HTN ([place])    Genetic testing 02/27/2017   Family history of thyroid cancer    Hypomagnesemia 10/03/2016   Atypical chest pain    HLD (hyperlipidemia) 09/23/2016   Gout 09/23/2016   DVT (deep vein thrombosis) in pregnancy 09/23/2016   Thyroid cancer ([place]) 09/23/2016   Hypocalcemia 09/23/2016   AKI (acute kidney injury) ([place]) 09/23/2016   Acute pulmonary edema (HCC)    Acute hypoxemic respiratory failure (HCC)    Ventilator dependent (HCC)    Chronic respiratory failure with hypoxia and hypercapnia ([place]) 07/16/2016   CAP (community acquired pneumonia) 01/23/2016   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 01/23/2016   Pulmonary hypertension ([place]) 01/23/2016   Obesity hypoventilation syndrome ([place]) 08/26/2015   Dyslipidemia associated with type 2 diabetes mellitus ([place]) 04/24/2015   Leukocytosis 04/24/2015   Controlled type 2 diabetes mellitus without complication, without long-term current use of insulin ([place]) 04/24/2015   Anxiety 04/24/2015   Benign essential HTN 04/24/2015   Normocytic anemia 04/24/2015   OSA (obstructive sleep apnea) 05/10/2012   Tobacco abuse 07/04/2011    Conditions to be addressed/monitored: HTN and DM  Care Plan : Medication Management  Updates made by Lane Hacker, [place] since  06/28/2020 12:00 AM     Problem: Health Promotion or Disease Self-Management (General Plan of Care)      Goal: Medication Management   Note:   Current Barriers:  Does not adhere to prescribed medication regimen Does not maintain contact with provider office Does not contact provider office for questions/concerns   Pharmacist Clinical Goal(s):  Over the next 30 days, patient will achieve adherence to monitoring guidelines and medication adherence to achieve therapeutic efficacy contact provider office for questions/concerns as evidenced notation of same in electronic health record through collaboration with PharmD and provider.    Interventions: Inter-disciplinary care team collaboration (see longitudinal plan of care) Comprehensive medication review performed; medication list updated in electronic medical record  '@RXCPDIABETES' @ '@RXCPHYPERTENSION' @ '@RXCPHYPERLIPIDEMIA' @  Patient Goals/Self-Care Activities Over the next 30 days, patient will:  - take medications as prescribed collaborate with provider on medication access solutions  Follow Up Plan: The care management team will reach out to the patient again over the next 30 days.      Task: Mutually Develop and Royce Macadamia Achievement of Patient Goals   Note:   Care Management Activities:    - verbalization of feelings encouraged    Notes:      Medication Assistance: None required. Patient affirms current coverage meets needs.   Follow up: Agree   Plan: The care management team will reach out to the patient again over the next 30 days.   [place] Constable, Pharm.D., Managed Medicaid Pharmacist - [phone]

## 2020-11-05 ENCOUNTER — Encounter (HOSPITAL_BASED_OUTPATIENT_CLINIC_OR_DEPARTMENT_OTHER): Payer: Medicaid Other | Admitting: Internal Medicine

## 2020-11-05 ENCOUNTER — Other Ambulatory Visit: Payer: Self-pay | Admitting: Obstetrics and Gynecology

## 2020-11-05 ENCOUNTER — Other Ambulatory Visit: Payer: Self-pay

## 2020-11-05 DIAGNOSIS — S91301D Unspecified open wound, right foot, subsequent encounter: Secondary | ICD-10-CM

## 2020-11-05 DIAGNOSIS — Z87891 Personal history of nicotine dependence: Secondary | ICD-10-CM | POA: Diagnosis not present

## 2020-11-05 DIAGNOSIS — Z833 Family history of diabetes mellitus: Secondary | ICD-10-CM | POA: Diagnosis not present

## 2020-11-05 DIAGNOSIS — Z91030 Bee allergy status: Secondary | ICD-10-CM | POA: Diagnosis not present

## 2020-11-05 DIAGNOSIS — E11621 Type 2 diabetes mellitus with foot ulcer: Secondary | ICD-10-CM

## 2020-11-05 DIAGNOSIS — Z86718 Personal history of other venous thrombosis and embolism: Secondary | ICD-10-CM | POA: Diagnosis not present

## 2020-11-05 DIAGNOSIS — Z7689 Persons encountering health services in other specified circumstances: Secondary | ICD-10-CM | POA: Diagnosis not present

## 2020-11-05 DIAGNOSIS — I110 Hypertensive heart disease with heart failure: Secondary | ICD-10-CM | POA: Diagnosis not present

## 2020-11-05 DIAGNOSIS — Z883 Allergy status to other anti-infective agents status: Secondary | ICD-10-CM | POA: Diagnosis not present

## 2020-11-05 DIAGNOSIS — I509 Heart failure, unspecified: Secondary | ICD-10-CM | POA: Diagnosis not present

## 2020-11-05 DIAGNOSIS — Z886 Allergy status to analgesic agent status: Secondary | ICD-10-CM | POA: Diagnosis not present

## 2020-11-05 NOTE — Progress Notes (Signed)
Michele Owens ([id number]) Visit Report for 11/05/2020 Chief Complaint Document Details Patient Name: Date of Service: Michele Owens, Michele Owens 11/05/2020 10:00 [place] Record Number: [id number] Patient Account Number: [id number] Date of Birth/Sex: Treating RN: Dec 28, 1960 (60 y.o. Michele Owens: Michele Owens Other Clinician: Referring Owens: Treating Owens/Extender: Michele Owens in Treatment: 1 Information Obtained from: Patient Chief Complaint Right foot wound Electronic Signature(s) Signed: 11/05/2020 1:23:45 PM By: Michele Shan DO Entered By: Michele Owens on 11/05/2020 13:18:13 -------------------------------------------------------------------------------- HPI Details Patient Name: Date of Service: Michele Owens 11/05/2020 10:00 [place] Record Number: [id number] Patient Account Number: [id number] Date of Birth/Sex: Treating RN: [birth year] (60 y.o. Michele Owens: Michele Owens Other Clinician: Referring Owens: Treating Owens/Extender: Michele Owens in Treatment: 1 History of Present Illness HPI Description: Admission 7/22 Michele Owens is a 59 year old female with a past medical history of controlled type 2 diabetes on oral agents, essential hypertension, COPD on chronic 4 L O2 via an [place] that presents the clinic for a 2 week history of nonhealing wound to the right medial foot. Patient states that she was out with her mother walking when she fell and landed on her foot landed on a rock. She required assistance from the pizza delivery man to get back up. She visited the ED for this issue. She had imaging of her right foot that showed no acute fracture or dislocation. She currently denies pain. She denies increased warmth erythema or drainage from the wound. 7/29; patient presents for 1 week follow-up. She has tolerated the compression wrap. She is  still concerned that she is having a lot of pain to her foot. She denies signs of infection. Electronic Signature(s) Signed: 11/05/2020 1:23:45 PM By: Michele Shan DO Entered By: Michele Owens on 11/05/2020 13:19:14 -------------------------------------------------------------------------------- Physical Exam Details Patient Name: Date of Service: Michele Owens, Michele Owens 11/05/2020 10:00 [facility] Record Number: [id number] Patient Account Number: [id number] Date of Birth/Sex: Treating RN: 08-13-1960 (60 y.o. Michele Owens: Michele Owens Other Clinician: Referring Owens: Treating Owens/Extender: Michele Owens in Treatment: 1 Constitutional respirations regular, non-labored and within target range for patient.. Cardiovascular 2+ dorsalis pedis/posterior tibialis pulses. Psychiatric pleasant and cooperative. Notes Right foot: Medial aspect with an open wound with fibrotic adherent debris. No signs of infection. Good edema control. There is some tenderness on exam to the surrounding wound bed. Electronic Signature(s) Signed: 11/05/2020 1:23:45 PM By: Michele Shan DO Entered By: Michele Owens on 11/05/2020 13:19:57 -------------------------------------------------------------------------------- Physician Orders Details Patient Name: Date of Service: Michele Owens 11/05/2020 10:00 [place] Record Number: [id number] Patient Account Number: [id number] Date of Birth/Sex: Treating RN: 07-15-1960 (60 y.o. Michele Owens: Michele Owens Other Clinician: Referring Owens: Treating Owens/Extender: Michele Owens in Treatment: 1 Verbal / Phone Orders: No Diagnosis Coding ICD-10 Coding Code Description E11.621 Type 2 diabetes mellitus with foot ulcer S91.301A Unspecified open wound, right foot, initial encounter Follow-up Appointments Return Appointment in 1  week. Bathing/ Shower/ Hygiene May shower with protection but do not get wound dressing(s) wet. Edema Control - Lymphedema / SCD / Other Right Lower Extremity Elevate legs to the level of the heart or above for 30 minutes daily and/or when sitting, a frequency of: - throughout the day Avoid standing for long periods of time. Exercise regularly Wound Treatment Wound #1 - Foot Wound Laterality: Right, Medial Peri-Wound Care: Sween Lotion (Moisturizing  lotion) 1 x Per Week/30 Days Discharge Instructions: Apply moisturizing lotion as directed Prim Dressing: Hydrofera Blue Ready Foam, 2.5 x2.5 in 1 x Per Week/30 Days ary Discharge Instructions: Apply to wound bed as instructed Prim Dressing: Santyl Ointment 1 x Per Week/30 Days ary Discharge Instructions: Apply nickel thick amount to wound bed as instructed Secondary Dressing: Woven Gauze Sponge, Non-Sterile 4x4 in 1 x Per Week/30 Days Discharge Instructions: Apply over primary dressing as directed. Secondary Dressing: Optifoam Non-Adhesive Dressing, 4x4 in 1 x Per Week/30 Days Discharge Instructions: Apply over primary dressing cut to make foam donut to offload Compression Wrap: Kerlix Roll 4.5x3.1 (in/yd) 1 x Per Week/30 Days Discharge Instructions: Apply Kerlix and Coban compression as directed. Compression Wrap: Coban Self-Adherent Wrap 4x5 (in/yd) 1 x Per Week/30 Days Discharge Instructions: Apply over Kerlix as directed. Radiology X-ray, foot 2 view right foot - diabetic ulcer right medial foot CPT7 2730 - (ICD10 E11.621 - Type 2 diabetes mellitus with foot ulcer) Electronic Signature(s) Signed: 11/05/2020 1:23:45 PM By: Michele Shan DO Entered By: Michele Owens on 11/05/2020 13:20:39 Prescription 11/05/2020 -------------------------------------------------------------------------------- Tennis Ship DO Patient Name: Owens: 1961-03-15 [id number] Date of Birth: NPI#Corliss Marcus Sex: DEA  #: [phone] Phone #: License #: [employer] Patient Address: [address], [place] [address] [place], [place] [address] [phone] Allergies bee venom protein (honey bee); Fioricet; ibuprofen; Lamisil; NSAIDS (Non-Steroidal Anti-Inflammatory Drug) Owens's Orders X-ray, foot 2 view right foot - ICD10: E11.621 - diabetic ulcer right medial foot CPT7 2730 Hand Signature: Date(s): Electronic Signature(s) Signed: 11/05/2020 1:23:45 PM By: Michele Shan DO Entered By: Michele Owens on 11/05/2020 13:20:39 -------------------------------------------------------------------------------- Problem List Details Patient Name: Date of Service: Michele Owens 11/05/2020 10:00 [place] Record Number: [id number] Patient Account Number: [id number] Date of Birth/Sex: Treating RN: Mar 30, 1961 (60 y.o. Michele Owens: Michele Owens Other Clinician: Referring Owens: Treating Owens/Extender: Michele Owens in Treatment: 1 Active Problems ICD-10 Encounter Code Description Active Date MDM Diagnosis S91.301D Unspecified open wound, right foot, subsequent encounter 11/05/2020 No Yes E11.621 Type 2 diabetes mellitus with foot ulcer 10/29/2020 No Yes Inactive Problems ICD-10 Code Description Active Date Inactive Date S91.301A Unspecified open wound, right foot, initial encounter 10/29/2020 10/29/2020 Resolved Problems Electronic Signature(s) Signed: 11/05/2020 1:23:45 PM By: Michele Shan DO Entered By: Michele Owens on 11/05/2020 13:17:56 -------------------------------------------------------------------------------- Progress Note Details Patient Name: Date of Service: Michele Owens 11/05/2020 10:00 [place] Record Number: [id number] Patient Account Number: [id number] Date of Birth/Sex: Treating RN: 1960/11/18 (60 y.o. Michele Owens: Michele Owens Other Clinician: Referring Owens: Treating Owens/Extender: Michele Owens in Treatment: 1 Subjective Chief Complaint Information obtained from Patient Right foot wound History of Present Illness (HPI) Admission 7/22 Ms. Khylah Iseman is a 60 year old female with a past medical history of controlled type 2 diabetes on oral agents, essential hypertension, COPD on chronic 4 L O2 via an [place] that presents the clinic for a 2 week history of nonhealing wound to the right medial foot. Patient states that she was out with her mother walking when she fell and landed on her foot landed on a rock. She required assistance from the pizza delivery man to get back up. She visited the ED for this issue. She had imaging of her right foot that showed no acute fracture or dislocation. She currently denies pain. She  denies increased warmth erythema or drainage from the wound. 7/29; patient presents for 1 week follow-up. She has tolerated the compression wrap. She is still concerned that she is having a lot of pain to her foot. She denies signs of infection. Patient History Information obtained from Patient. Family History Cancer - Father, Diabetes - Mother. Social History Former smoker - quit 07/2016, Alcohol Use - Never, Drug Use - No History, Caffeine Use - Never. Medical History Eyes Denies history of Cataracts, Glaucoma, Optic Neuritis Ear/Nose/Mouth/Throat Denies history of Chronic sinus problems/congestion, Middle ear problems Hematologic/Lymphatic Denies history of Anemia, Hemophilia, Human Immunodeficiency Virus, Lymphedema, Sickle Cell Disease Respiratory Patient has history of Asthma, Chronic Obstructive Pulmonary Disease (COPD) - 4L 02 [place] dependent Denies history of Aspiration, Pneumothorax, Sleep Apnea, Tuberculosis Cardiovascular Patient has history of Congestive Heart Failure, Deep Vein Thrombosis,  Hypertension Denies history of Angina, Arrhythmia, Coronary Artery Disease, Hypotension, Myocardial Infarction, Peripheral Arterial Disease, Peripheral Venous Disease, Phlebitis, Vasculitis Gastrointestinal Denies history of Cirrhosis , Colitis, Crohnoos, Hepatitis A, Hepatitis B, Hepatitis C Endocrine Patient has history of Type II Diabetes Denies history of Type I Diabetes Genitourinary Denies history of End Stage Renal Disease Immunological Denies history of Lupus Erythematosus, Raynaudoos, Scleroderma Integumentary (Skin) Denies history of History of Burn Musculoskeletal Patient has history of Gout, Osteoarthritis Denies history of Rheumatoid Arthritis, Osteomyelitis Neurologic Denies history of Dementia, Neuropathy, Quadriplegia, Paraplegia, Seizure Disorder Oncologic Denies history of Received Radiation Psychiatric Denies history of Anorexia/bulimia, Confinement Anxiety Hospitalization/Surgery History - thyroidectomy-09/2016. Medical A Surgical History Notes nd Oncologic thyroid cancer 2018 Psychiatric anxiety and depression Objective Constitutional respirations regular, non-labored and within target range for patient.. Vitals Time Taken: 11:56 AM, Height: 65 in, Weight: 306 lbs, BMI: 50.9, Temperature: 98.4 F, Pulse: 92 bpm, Respiratory Rate: 20 breaths/min, Blood Pressure: 105/66 mmHg, Capillary Blood Glucose: 116 mg/dl. Cardiovascular 2+ dorsalis pedis/posterior tibialis pulses. Psychiatric pleasant and cooperative. General Notes: Right foot: Medial aspect with an open wound with fibrotic adherent debris. No signs of infection. Good edema control. There is some tenderness on exam to the surrounding wound bed. Integumentary (Hair, Skin) Wound #1 status is Open. Original cause of wound was Trauma. The date acquired was: 10/22/2020. The wound has been in treatment 1 Owens. The wound is located on the Right,Medial Foot. The wound measures 0.4cm length x 0.6cm width x  0.2cm depth; 0.188cm^2 area and 0.038cm^3 volume. There is Fat Layer (Subcutaneous Tissue) exposed. There is no tunneling or undermining noted. There is a medium amount of serosanguineous drainage noted. The wound margin is distinct with the outline attached to the wound base. There is small (1-33%) pink, pale granulation within the wound bed. There is a large (67-100%) amount of necrotic tissue within the wound bed including Adherent Slough. Assessment Active Problems ICD-10 Unspecified open wound, right foot, subsequent encounter Type 2 diabetes mellitus with foot ulcer Patient presents for 1 week follow-up. Her wound has shown improvement in size. There is still fibrotic adherent debris that I attempted to debride today. I recommended continuing Hydrofera Blue with Santyl under Kerlix/Coban. There are no signs of infection today. She did however have tenderness to the surrounding wound bed. Since this was a trauma wound I would like to reimage with an x-ray. Original x-ray 2 Owens ago did not show acute fracture or dislocation. I would be looking for an occult fracture. Plan Follow-up Appointments: Return Appointment in 1 week. Bathing/ Shower/ Hygiene: May shower with protection but do not get wound dressing(s) wet. Edema Control - Lymphedema /  SCD / Other: Elevate legs to the level of the heart or above for 30 minutes daily and/or when sitting, a frequency of: - throughout the day Avoid standing for long periods of time. Exercise regularly Radiology ordered were: X-ray, foot 2 view right foot - diabetic ulcer right medial foot CPT7 2730 WOUND #1: - Foot Wound Laterality: Right, Medial Peri-Wound Care: Sween Lotion (Moisturizing lotion) 1 x Per Week/30 Days Discharge Instructions: Apply moisturizing lotion as directed Prim Dressing: Hydrofera Blue Ready Foam, 2.5 x2.5 in 1 x Per Week/30 Days ary Discharge Instructions: Apply to wound bed as instructed Prim Dressing: Santyl Ointment  1 x Per Week/30 Days ary Discharge Instructions: Apply nickel thick amount to wound bed as instructed Secondary Dressing: Woven Gauze Sponge, Non-Sterile 4x4 in 1 x Per Week/30 Days Discharge Instructions: Apply over primary dressing as directed. Secondary Dressing: Optifoam Non-Adhesive Dressing, 4x4 in 1 x Per Week/30 Days Discharge Instructions: Apply over primary dressing cut to make foam donut to offload Com pression Wrap: Kerlix Roll 4.5x3.1 (in/yd) 1 x Per Week/30 Days Discharge Instructions: Apply Kerlix and Coban compression as directed. Com pression Wrap: Coban Self-Adherent Wrap 4x5 (in/yd) 1 x Per Week/30 Days Discharge Instructions: Apply over Kerlix as directed. 1. In office sharp debridement 2. Santyl with Hydrofera Blue under Kerlix/Coban 3. Foot x-ray 4. Follow-up in 1 week. Electronic Signature(s) Signed: 11/05/2020 1:23:45 PM By: Michele Shan DO Entered By: Michele Owens on 11/05/2020 13:22:47 -------------------------------------------------------------------------------- HxROS Details Patient Name: Date of Service: Michele Owens, Michele Owens 11/05/2020 10:00 [place] Record Number: [id number] Patient Account Number: [id number] Date of Birth/Sex: Treating RN: [birth year] (60 y.o. Michele Owens: Michele Owens Other Clinician: Referring Owens: Treating Owens/Extender: Michele Owens in Treatment: 1 Information Obtained From Patient Eyes Medical History: Negative for: Cataracts; Glaucoma; Optic Neuritis Ear/Nose/Mouth/Throat Medical History: Negative for: Chronic sinus problems/congestion; Middle ear problems Hematologic/Lymphatic Medical History: Negative for: Anemia; Hemophilia; Human Immunodeficiency Virus; Lymphedema; Sickle Cell Disease Respiratory Medical History: Positive for: Asthma; Chronic Obstructive Pulmonary Disease (COPD) - 4L 02 [place] dependent Negative for: Aspiration; Pneumothorax;  Sleep Apnea; Tuberculosis Cardiovascular Medical History: Positive for: Congestive Heart Failure; Deep Vein Thrombosis; Hypertension Negative for: Angina; Arrhythmia; Coronary Artery Disease; Hypotension; Myocardial Infarction; Peripheral Arterial Disease; Peripheral Venous Disease; Phlebitis; Vasculitis Gastrointestinal Medical History: Negative for: Cirrhosis ; Colitis; Crohns; Hepatitis A; Hepatitis B; Hepatitis C Endocrine Medical History: Positive for: Type II Diabetes Negative for: Type I Diabetes Time with diabetes: unsure Treated with: Oral agents, Diet Blood sugar tested every day: Yes Tested : daily Genitourinary Medical History: Negative for: End Stage Renal Disease Immunological Medical History: Negative for: Lupus Erythematosus; Raynauds; Scleroderma Integumentary (Skin) Medical History: Negative for: History of Burn Musculoskeletal Medical History: Positive for: Gout; Osteoarthritis Negative for: Rheumatoid Arthritis; Osteomyelitis Neurologic Medical History: Negative for: Dementia; Neuropathy; Quadriplegia; Paraplegia; Seizure Disorder Oncologic Medical History: Negative for: Received Radiation Past Medical History Notes: thyroid cancer 2018 Psychiatric Medical History: Negative for: Anorexia/bulimia; Confinement Anxiety Past Medical History Notes: anxiety and depression Immunizations Implantable Devices No devices added Hospitalization / Surgery History Type of Hospitalization/Surgery thyroidectomy-09/2016 Family and Social History Cancer: Yes - Father; Diabetes: Yes - Mother; Former smoker - quit 07/2016; Alcohol Use: Never; Drug Use: No History; Caffeine Use: Never; Financial Concerns: No; Food, Clothing or Shelter Needs: No; Support System Lacking: No; Transportation Concerns: No Electronic Signature(s) Signed: 11/05/2020 1:23:45 PM By: Michele Shan DO Signed: 11/05/2020 2:27:43 PM By: Baruch Gouty RN, BSN Entered By: Michele Owens on  11/05/2020 13:19:23 -------------------------------------------------------------------------------- SuperBill Details Patient Name: Date of Service: Michele Owens, Michele Owens 11/05/2020 Medical Record Number: [id number] Patient Account Number: [id number] Date of Birth/Sex: Treating RN: 1960-04-22 (60 y.o. Michele Owens: Michele Owens Other Clinician: Referring Owens: Treating Owens/Extender: Michele Owens in Treatment: 1 Diagnosis Coding ICD-10 Codes Code Description [id number] Unspecified open wound, right foot, subsequent encounter E11.621 Type 2 diabetes mellitus with foot ulcer Facility Procedures CPT4 Code: [id number] Description: [id number] - DEB SUBQ TISSUE 20 SQ CM/< ICD-10 Diagnosis Description S91.301D Unspecified open wound, right foot, subsequent encounter E11.621 Type 2 diabetes mellitus with foot ulcer Modifier: Quantity: 1 Physician Procedures : CPT4 Code Description Modifier [id number] - WC PHYS SUBQ TISS 20 SQ CM ICD-10 Diagnosis Description S91.301D Unspecified open wound, right foot, subsequent encounter E11.621 Type 2 diabetes mellitus with foot ulcer Quantity: 1 Electronic Signature(s) Signed: 11/05/2020 1:23:45 PM By: Michele Shan DO Entered By: Michele Owens on 11/05/2020 13:23:15
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
## 2020-11-08 DIAGNOSIS — Z419 Encounter for procedure for purposes other than remedying health state, unspecified: Secondary | ICD-10-CM | POA: Diagnosis not present

## 2020-11-08 DIAGNOSIS — I110 Hypertensive heart disease with heart failure: Secondary | ICD-10-CM | POA: Diagnosis not present

## 2020-11-08 NOTE — Progress Notes (Signed)
Michele, Owens ([id number]) Visit Report for 11/05/2020 Arrival Information Details Patient Name: Date of Service: Michele, Owens 11/05/2020 10:00 [place] Record Number: [id number] Patient Account Number: [id number] Date of Birth/Sex: Treating RN: 1961/01/25 (60 y.o. [name] Primary Care Janeal Abadi: Gala Romney Other Clinician: Referring Rashana Andrew: Treating Adaysha Dubinsky/Extender: Lamont Snowball in Treatment: 1 Visit Information History Since Last Visit Added or deleted any medications: No Patient Arrived: Wheel Chair Any new allergies or adverse reactions: No Arrival Time: 11:55 Had a fall or experienced change in No Accompanied By: self activities of daily living that may affect Transfer Assistance: Manual risk of falls: Patient Identification Verified: Yes Signs or symptoms of abuse/neglect since last visito No Secondary Verification Process Completed: Yes Hospitalized since last visit: No Patient Requires Transmission-Based Precautions: No Implantable device outside of the clinic excluding No Patient Has Alerts: No cellular tissue based products placed in the center since last visit: Has Dressing in Place as Prescribed: Yes Pain Present Now: Yes Electronic Signature(s) Signed: 11/08/2020 1:33:39 PM By: Sandre Kitty Entered By: Sandre Kitty on 11/05/2020 11:56:10 -------------------------------------------------------------------------------- Lower Extremity Assessment Details Patient Name: Date of Service: Michele, Owens 11/05/2020 10:00 [place] Record Number: [id number] Patient Account Number: [id number] Date of Birth/Sex: Treating RN: 02/22/1961 (59 y.o. [name] Primary Care Ted Leonhart: Gala Romney Other Clinician: Referring Robbie Rideaux: Treating Shanine Kreiger/Extender: Lambert Keto Weeks in Treatment: 1 Edema Assessment Assessed: [Left: No] [Right: Yes] Edema: [Left: Ye] [Right: s] Calf Left:  Right: Point of Measurement: 27 cm From Medial Instep 40 cm Ankle Left: Right: Point of Measurement: 8 cm From Medial Instep 23 cm Vascular Assessment Pulses: Dorsalis Pedis Palpable: [Right:Yes] Electronic Signature(s) Signed: 11/05/2020 2:27:43 PM By: Baruch Gouty RN, BSN Signed: 11/08/2020 1:33:39 PM By: Sandre Kitty Entered By: Sandre Kitty on 11/05/2020 12:22:57 -------------------------------------------------------------------------------- Multi Wound Chart Details Patient Name: Date of Service: Michele Owens 11/05/2020 10:00 [place] Record Number: [id number] Patient Account Number: [id number] Date of Birth/Sex: Treating RN: Apr 08, 1961 (60 y.o. 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 Primary Etiology: Extremity Asthma, Chronic Obstructive N/A N/A Comorbid History: Pulmonary Disease (COPD), Congestive Heart Failure, Deep Vein Thrombosis, Hypertension, Type II Diabetes, Gout, Osteoarthritis 10/22/2020 N/A N/A Date Acquired: 1 N/A N/A Weeks of Treatment: Open N/A N/A Wound Status: 0.4x0.6x0.2 N/A N/A Measurements L x W x D (cm) 0.188 N/A N/A A (cm) : rea 0.038 N/A N/A Volume (cm) : 40.10% N/A N/A % Reduction in A rea: -22.60% N/A N/A % Reduction in Volume: Unable to visualize wound bed N/A N/A Classification: Medium N/A N/A Exudate A mount: Serosanguineous N/A N/A Exudate Type: red, brown N/A N/A Exudate Color: Distinct, outline attached N/A N/A Wound  Margin: Small (1-33%) N/A N/A Granulation A mount: Pink, Pale N/A N/A Granulation Quality: Large (67-100%) N/A N/A Necrotic A mount: Fat Layer (Subcutaneous Tissue): Yes N/A N/A Exposed Structures: Fascia: No Tendon: No Muscle: No Joint: No Bone: No Small (1-33%) N/A N/A Epithelialization: Treatment Notes Electronic Signature(s) Signed: 11/05/2020 1:23:45 PM By: Kalman Shan DO Signed: 11/05/2020 2:27:43 PM By: Baruch Gouty RN, BSN Entered By: Kalman Shan on 11/05/2020 13:18:04 -------------------------------------------------------------------------------- Multi-Disciplinary  Care Plan Details Patient Name: Date of Service: Michele, Owens 11/05/2020 10:00 [place] Record Number: [id number] Patient Account Number: [id number] Date of Birth/Sex: Treating RN: 1961-02-19 (60 y.o. [name] Primary Care Zema Lizardo: Gala Romney Other Clinician: Referring Johnny Gorter: Treating Ellinore Merced/Extender: Lamont Snowball in Treatment: 1 Multidisciplinary Care Plan reviewed with physician Active Inactive Abuse / Safety / Falls / Self Care Management Nursing Diagnoses: Potential for falls Goals: Patient/caregiver will verbalize/demonstrate measures taken to prevent injury and/or falls Date Initiated: 10/29/2020 Target Resolution Date: 11/26/2020 Goal Status: Active Interventions: Assess fall risk on admission and as needed Assess impairment of mobility on admission and as needed per policy Notes: Nutrition Nursing Diagnoses: Impaired glucose control: actual or potential Potential for alteratiion in Nutrition/Potential for imbalanced nutrition Goals: Patient/caregiver will maintain therapeutic glucose control Date Initiated: 10/29/2020 Target Resolution Date: 11/26/2020 Goal Status: Active Interventions: Assess HgA1c results as ordered upon admission and as needed Assess patient nutrition upon admission and as needed per policy Provide  education on elevated blood sugars and impact on wound healing Treatment Activities: Patient referred to Primary Care Physician for further nutritional evaluation : 10/29/2020 Notes: Wound/Skin Impairment Nursing Diagnoses: Impaired tissue integrity Knowledge deficit related to ulceration/compromised skin integrity Goals: Patient/caregiver will verbalize understanding of skin care regimen Date Initiated: 10/29/2020 Target Resolution Date: 11/26/2020 Goal Status: Active Ulcer/skin breakdown will have a volume reduction of 30% by week 4 Date Initiated: 10/29/2020 Target Resolution Date: 11/26/2020 Goal Status: Active Interventions: Assess patient/caregiver ability to obtain necessary supplies Assess patient/caregiver ability to perform ulcer/skin care regimen upon admission and as needed Assess ulceration(s) every visit Provide education on ulcer and skin care Treatment Activities: Skin care regimen initiated : 10/29/2020 Topical wound management initiated : 10/29/2020 Notes: Electronic Signature(s) Signed: 11/05/2020 2:27:43 PM By: Baruch Gouty RN, BSN Entered By: Baruch Gouty on 11/05/2020 12:55:11 -------------------------------------------------------------------------------- Pain Assessment Details Patient Name: Date of Service: Michele Owens 11/05/2020 10:00 [place] Record Number: [id number] Patient Account Number: [id number] Date of Birth/Sex: Treating RN: 1960-06-24 (60 y.o. [name] Primary Care Kasson Lamere: Gala Romney Other Clinician: Referring Kobe Ofallon: Treating Alexcia Schools/Extender: Lambert Keto Weeks in Treatment: 1 Active Problems Location of Pain Severity and Description of Pain Patient Has Paino Yes Site Locations Rate the pain. Current Pain Level: 10 Pain Management and Medication Current Pain Management: Electronic Signature(s) Signed: 11/05/2020 2:27:43 PM By: Baruch Gouty RN, BSN Signed: 11/08/2020 1:33:39 PM By:  Sandre Kitty Entered By: Sandre Kitty on 11/05/2020 11:57:54 -------------------------------------------------------------------------------- Patient/Caregiver Education Details Patient Name: Date of Service: Michele Owens 7/29/2022andnbsp10:00 [place] Record Number: [id number] Patient Account Number: [id number] Date of Birth/Gender: Treating RN: [birth year] (60 y.o. [name] Primary Care Physician: Gala Romney Other Clinician: Referring Physician: Treating Physician/Extender: Lamont Snowball in Treatment: 1 Education Assessment Education Provided To: Patient Education Topics Provided Offloading: Methods: Explain/Verbal Responses: Reinforcements needed, State content correctly Wound/Skin Impairment: Methods: Explain/Verbal Responses: Reinforcements needed, State content correctly Electronic Signature(s) Signed: 11/05/2020 2:27:43 PM By: Baruch Gouty RN, BSN Entered By: Baruch Gouty on 11/05/2020 12:55:29 -------------------------------------------------------------------------------- Wound Assessment Details Patient Name: Date of Service: Michele, Owens 11/05/2020 10:00 [place] Record Number: [id number] Patient Account Number: [id number] Date of Birth/Sex: Treating RN: 1961/04/04 (60 y.o. [name] Primary Care Latice Waitman: Gala Romney Other Clinician: Referring Natascha Edmonds: Treating Shanele Nissan/Extender: Lambert Keto Weeks in Treatment: 1 Wound Status Wound Number: 1 Primary Diabetic Wound/Ulcer of the Lower Extremity Etiology: Wound Location: Right, Medial Foot  Wound Open Wounding Event: Trauma Status: Date Acquired: 10/22/2020 Comorbid Asthma, Chronic Obstructive Pulmonary Disease (COPD), Weeks Of Treatment: 1 History: Congestive Heart Failure, Deep Vein Thrombosis, Hypertension, Clustered Wound: No Type II Diabetes, Gout, Osteoarthritis Photos Wound Measurements Length: (cm)  0.4 Width: (cm) 0.6 Depth: (cm) 0.2 Area: (cm) 0.188 Volume: (cm) 0.038 % Reduction in Area: 40.1% % Reduction in Volume: -22.6% Epithelialization: Small (1-33%) Tunneling: No Undermining: No Wound Description Classification: Unable to visualize wound bed Wound Margin: Distinct, outline attached Exudate Amount: Medium Exudate Type: Serosanguineous Exudate Color: red, brown Foul Odor After Cleansing: No Slough/Fibrino Yes Wound Bed Granulation Amount: Small (1-33%) Exposed Structure Granulation Quality: Pink, Pale Fascia Exposed: No Necrotic Amount: Large (67-100%) Fat Layer (Subcutaneous Tissue) Exposed: Yes Necrotic Quality: Adherent Slough Tendon Exposed: No Muscle Exposed: No Joint Exposed: No Bone Exposed: No Electronic Signature(s) Signed: 11/05/2020 2:27:43 PM By: Baruch Gouty RN, BSN Signed: 11/08/2020 1:33:39 PM By: Sandre Kitty Entered By: Sandre Kitty on 11/05/2020 12:23:35 -------------------------------------------------------------------------------- Vitals Details Patient Name: Date of Service: Michele Owens 11/05/2020 10:00 [place] Record Number: [id number] Patient Account Number: [id number] Date of Birth/Sex: Treating RN: 06/26/1960 (60 y.o. [name] Primary Care Sarafina Puthoff: Gala Romney Other Clinician: Referring Saksham Akkerman: Treating Hinda Lindor/Extender: Lambert Keto Weeks in Treatment: 1 Vital Signs Time Taken: 11:56 Temperature (F): 98.4 Height (in): 65 Pulse (bpm): 92 Weight (lbs): 306 Respiratory Rate (breaths/min): 20 Body Mass Index (BMI): 50.9 Blood Pressure (mmHg): 105/66 Capillary Blood Glucose (mg/dl): 116 Reference Range: 80 - 120 mg / dl Electronic Signature(s) Signed: 11/08/2020 1:33:39 PM By: Sandre Kitty Entered By: Sandre Kitty on 11/05/2020 11:57:45

## 2020-11-09 DIAGNOSIS — I110 Hypertensive heart disease with heart failure: Secondary | ICD-10-CM | POA: Diagnosis not present

## 2020-11-10 ENCOUNTER — Encounter (HOSPITAL_COMMUNITY): Payer: Self-pay

## 2020-11-10 ENCOUNTER — Emergency Department (HOSPITAL_COMMUNITY)
Admission: EM | Admit: 2020-11-10 | Discharge: 2020-11-11 | Disposition: A | Discharge status: Home / Self Care | Payer: Medicaid Other | Attending: Student | Admitting: Student

## 2020-11-10 ENCOUNTER — Other Ambulatory Visit (HOSPITAL_COMMUNITY): Payer: Self-pay | Admitting: Cardiology

## 2020-11-10 ENCOUNTER — Other Ambulatory Visit: Payer: Self-pay

## 2020-11-10 ENCOUNTER — Emergency Department (HOSPITAL_COMMUNITY): Payer: Medicaid Other

## 2020-11-10 DIAGNOSIS — J441 Chronic obstructive pulmonary disease with (acute) exacerbation: Secondary | ICD-10-CM | POA: Diagnosis not present

## 2020-11-10 DIAGNOSIS — Z85850 Personal history of malignant neoplasm of thyroid: Secondary | ICD-10-CM | POA: Diagnosis not present

## 2020-11-10 DIAGNOSIS — Z7984 Long term (current) use of oral hypoglycemic drugs: Secondary | ICD-10-CM | POA: Diagnosis not present

## 2020-11-10 DIAGNOSIS — E785 Hyperlipidemia, unspecified: Secondary | ICD-10-CM | POA: Diagnosis not present

## 2020-11-10 DIAGNOSIS — Z79899 Other long term (current) drug therapy: Secondary | ICD-10-CM | POA: Insufficient documentation

## 2020-11-10 DIAGNOSIS — Z743 Need for continuous supervision: Secondary | ICD-10-CM | POA: Diagnosis not present

## 2020-11-10 DIAGNOSIS — I110 Hypertensive heart disease with heart failure: Secondary | ICD-10-CM | POA: Insufficient documentation

## 2020-11-10 DIAGNOSIS — I5032 Chronic diastolic (congestive) heart failure: Secondary | ICD-10-CM | POA: Insufficient documentation

## 2020-11-10 DIAGNOSIS — U071 COVID-19: Secondary | ICD-10-CM | POA: Diagnosis not present

## 2020-11-10 DIAGNOSIS — R0602 Shortness of breath: Secondary | ICD-10-CM | POA: Diagnosis not present

## 2020-11-10 DIAGNOSIS — Z87891 Personal history of nicotine dependence: Secondary | ICD-10-CM | POA: Insufficient documentation

## 2020-11-10 DIAGNOSIS — Z7951 Long term (current) use of inhaled steroids: Secondary | ICD-10-CM | POA: Diagnosis not present

## 2020-11-10 DIAGNOSIS — E1169 Type 2 diabetes mellitus with other specified complication: Secondary | ICD-10-CM | POA: Diagnosis not present

## 2020-11-10 DIAGNOSIS — R531 Weakness: Secondary | ICD-10-CM | POA: Diagnosis not present

## 2020-11-10 LAB — CBG MONITORING, ED: Glucose-Capillary: 116 mg/dL — ABNORMAL HIGH (ref 70–99)

## 2020-11-10 MED ORDER — METHYLPREDNISOLONE SODIUM SUCC 125 MG IJ SOLR
125.0000 mg | Freq: Once | INTRAMUSCULAR | Status: AC
  Administered 2020-11-10: 125 mg via INTRAVENOUS
  Filled 2020-11-10: qty 2

## 2020-11-10 MED ORDER — IPRATROPIUM-ALBUTEROL 0.5-2.5 (3) MG/3ML IN SOLN
3.0000 mL | Freq: Once | RESPIRATORY_TRACT | Status: AC
  Administered 2020-11-10: 3 mL via RESPIRATORY_TRACT
  Filled 2020-11-10: qty 3

## 2020-11-10 NOTE — ED Triage Notes (Signed)
Pt BIB EMS. Pt has complaints of shortness of breath. No signs of distress. Pt is on 3 L O2 at all times. Pt reports wanting to be screened for PNA.

## 2020-11-10 NOTE — ED Notes (Signed)
Pt is requesting an ambulance ride home and is on 3L [place] all the time.

## 2020-11-10 NOTE — ED Provider Notes (Signed)
[facility] DEPT Provider Note   CSN: [id number] Arrival date & time: 11/10/20  2036     History Chief Complaint  Patient presents with   Shortness of Breath    Michele Owens is a 60 y.o. female with PMH CHF, COPD on 3 L home O2, gout, HTN who presents to the emergency department for evaluation of shortness of breath.  Patient states that she feels that she is requiring more oxygen at home and is feeling more short of breath of the last 24 hours.  She presents via EMS due to concern for persistent dyspnea and she states that she would like to be screened for pneumonia.  She endorses cough but denies fever, chest pain, abdominal pain, nausea, vomiting or other systemic symptoms.   Shortness of Breath Associated symptoms: cough and wheezing   Associated symptoms: no abdominal pain, no chest pain, no ear pain, no fever, no rash, no sore throat and no vomiting       Past Medical History:  Diagnosis Date   Anxiety    Arthritis    Asthma    Chronic diastolic CHF (congestive heart failure) (HCC)    COPD (chronic obstructive pulmonary disease) (HCC)    Uses Oxygen at night   Depression    Diabetes mellitus without complication (HCC)    Dyspnea    occ   Family history of thyroid cancer    GERD (gastroesophageal reflux disease)    Gout    Headache    migraines   History of DVT of lower extremity    History of nuclear stress test    Myoview 2/17:  Low risk stress nuclear study with a small, moderate intensity, partially reversible inferior lateral defect consistent with small prior infarct and minimal peri-infarct ischemia; EF 68 with normal wall motion   Hypertension    Pneumonia    Thyroid cancer ([place]) 09/23/2016    Patient Active Problem List   Diagnosis Date Noted   Acute on chronic respiratory failure with hypoxia and hypercapnia ([place]) 04/18/2020   Acute on chronic respiratory failure with hypoxia ([place]) 04/18/2020   Hemoptysis 70/17/7939    Acute metabolic encephalopathy 03/00/9233   Acute encephalopathy 04/18/2020   Chronic diastolic CHF (congestive heart failure) ([place]) 04/18/2020   Sepsis ([place]) 04/18/2020   Morbid obesity with BMI of 50.0-59.9, adult ([place])    Fall at home, initial encounter 06/23/2018   HNP (herniated nucleus pulposus), thoracic 06/23/2018   CHF (congestive heart failure) ([place]) 06/18/2018   Hypertensive disorder 08/28/2017   Obesity 08/28/2017   COPD exacerbation ([place]) 08/04/2017   Acute and chronic respiratory failure with hypercapnia ([place])    Pulmonary HTN ([place])    Genetic testing 02/27/2017   Family history of thyroid cancer    Hypomagnesemia 10/03/2016   Atypical chest pain    HLD (hyperlipidemia) 09/23/2016   Gout 09/23/2016   DVT (deep vein thrombosis) in pregnancy 09/23/2016   Thyroid cancer ([place]) 09/23/2016   Hypocalcemia 09/23/2016   AKI (acute kidney injury) ([place]) 09/23/2016   Acute pulmonary edema ([place])    Acute hypoxemic respiratory failure ([place])    Ventilator dependent ([place])    Chronic respiratory failure with hypoxia and hypercapnia () 07/16/2016   CAP (community acquired pneumonia) 01/23/2016   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 01/23/2016   Pulmonary hypertension ([place]) 01/23/2016   Obesity hypoventilation syndrome ([place]) 08/26/2015   Dyslipidemia associated with type 2 diabetes mellitus ([place]) 04/24/2015   Leukocytosis 04/24/2015   Controlled type  2 diabetes mellitus without complication, without long-term current use of insulin ([place]) 04/24/2015   Anxiety 04/24/2015   Benign essential HTN 04/24/2015   Normocytic anemia 04/24/2015   OSA (obstructive sleep apnea) 05/10/2012   Tobacco abuse 07/04/2011    Past Surgical History:  Procedure Laterality Date   BUNIONECTOMY Bilateral    CARDIAC CATHETERIZATION N/A 06/23/2015   Procedure: Right/Left Heart Cath and Coronary Angiography;  Surgeon: [name], MD;  Location: [facility];  Service:  Cardiovascular;  Laterality: N/A;   COLONOSCOPY WITH PROPOFOL N/A 12/15/2015   Procedure: COLONOSCOPY WITH PROPOFOL;  Surgeon: [name], MD;  Location: [facility];  Service: Endoscopy;  Laterality: N/A;   RIGHT HEART CATH N/A 10/01/2019   Procedure: RIGHT HEART CATH;  Surgeon: [name], MD;  Location: [facility];  Service: Cardiovascular;  Laterality: N/A;   THYROIDECTOMY  09/19/2016   THYROIDECTOMY N/A 09/19/2016   Procedure: TOTAL THYROIDECTOMY;  Surgeon: [name], MD;  Location: [facility];  Service: General;  Laterality: N/A;   TONSILLECTOMY     TOTAL ABDOMINAL HYSTERECTOMY  07/14/10     OB History   No obstetric history on file.     Family History  Problem Relation Age of Onset   Cancer Father        thought to be due to exposure to concrete   Diabetes Mother    Thyroid cancer Mother        dx in her 63s-60s   Cancer Maternal Uncle        2 uncles with cancer NOS   Brain cancer Paternal Aunt    Cancer Cousin        maternal first cousin - NOS   Cancer Cousin        maternal first cousin - NOS    Social History   Tobacco Use   Smoking status: Former    Packs/day: 0.50    Years: 41.00    Pack years: 20.50    Types: Cigarettes    Quit date: 07/2016    Years since quitting: 4.3   Smokeless tobacco: Never   Tobacco comments:    Starting to [employer] -- using Nicotine Patch  Vaping Use   Vaping Use: Never used  Substance Use Topics   Alcohol use: Not Currently    Alcohol/week: 0.0 standard drinks    Comment: ? none now   Drug use: Never    Comment: none for long time    Home Medications Prior to Admission medications   Medication Sig Start Date End Date Taking? Authorizing Provider  albuterol (PROVENTIL HFA;VENTOLIN HFA) 108 (90 Base) MCG/ACT inhaler Inhale 1-2 puffs into the lungs every 6 (six) hours as needed for wheezing or shortness of breath. 04/23/18   Long, [name], MD  allopurinol (ZYLOPRIM) 100 MG tablet Take 100 mg by mouth daily.     [provider]  benzonatate (TESSALON) 200 MG capsule Take 1 capsule (200 mg total) by mouth in the morning, at noon, and at bedtime. 7 DS 04/13/20   Regalado, Jerald Kief A, MD  Blood Glucose Monitoring Suppl (ACCU-CHEK AVIVA PLUS) w/Device KIT 1 each by Other route daily.  08/07/17   [provider]  butalbital-acetaminophen-caffeine (FIORICET) 50-325-40 MG tablet Take 1 tablet by mouth 2 (two) times daily as needed for migraine. 08/13/20   [provider]  calcitRIOL (ROCALTROL) 0.5 MCG capsule Take 1 capsule (0.5 mcg total) by mouth daily. 09/26/16   Hongalgi, [name], MD  carvedilol (COREG) 6.25 MG tablet Take 1 tablet (6.25 mg total) by mouth 2 (two) times daily with a meal. 01/23/18   Bensimhon, [name], MD  clonazePAM (KLONOPIN) 2 MG tablet Take 1 tablet (2 mg total) by mouth 3 (three) times daily. for anxiety Patient taking differently: Take 2 mg by mouth 3 (three) times daily. 06/23/19   [name], MD  cyclobenzaprine (FLEXERIL) 10 MG tablet Take 10 mg by mouth 3 (three) times daily. 11/19/19   [provider]  diclofenac sodium (VOLTAREN) 1 % GEL Apply 2 g topically 4 (four) times daily as needed (for pain). Patient not taking: No sig reported 06/19/18   [name], MD  EPINEPHrine 0.3 mg/0.3 mL IJ SOAJ injection Inject 0.3 mg into the muscle once as needed for anaphylaxis.    [provider]  FEROSUL 325 (65 Fe) MG tablet Take 325 mg by mouth daily. 03/22/17   [provider]  glucose blood test strip Use as instructed to check blood sugar up to 4 times daily Patient taking differently: 1 each by Other route See admin instructions. Use as instructed to check blood sugar up to 4 times daily 08/06/17   [name], DO  guaiFENesin (MUCINEX) 600 MG 12 hr tablet Take 2 tablets (1,200 mg total) by mouth 2 (two) times daily. 04/13/20   Regalado, Belkys A, MD  ipratropium-albuterol (DUONEB) 0.5-2.5 (3) MG/3ML SOLN Take 3 mLs by nebulization 4  (four) times daily.  04/05/17   [provider]  levothyroxine (SYNTHROID, LEVOTHROID) 112 MCG tablet Take 224 mcg by mouth daily before breakfast. 07/11/17   [provider]  lisinopril (PRINIVIL,ZESTRIL) 5 MG tablet Take 1 tablet (5 mg total) by mouth daily. Please call for office visit [phone] Patient taking differently: Take 5 mg by mouth daily. 08/15/17   [name], MD  loperamide (IMODIUM) 2 MG capsule Take 1 capsule (2 mg total) by mouth 4 (four) times daily as needed for diarrhea or loose stools. 0/25/85   [name], MD  metFORMIN (GLUCOPHAGE) 500 MG tablet Take 1 tablet (500 mg total) by mouth 2 (two) times daily with a meal. 12/11/14   [name], MD  nitroGLYCERIN (NITROSTAT) 0.4 MG SL tablet Place 1 tablet (0.4 mg total) under the tongue every 5 (five) minutes as needed for chest pain. 06/19/18   [name], MD  nystatin cream (MYCOSTATIN) Apply 1 application topically 2 (two) times daily as needed for dry skin. 04/13/20   Regalado, Belkys A, MD  nystatin ointment (MYCOSTATIN) Apply 1 application topically 2 (two) times daily. 09/10/20   [provider]  omeprazole (PRILOSEC) 20 MG capsule Take 20 mg by mouth 2 (two) times daily before a meal. 11/19/19   [provider]  oxyCODONE-acetaminophen (PERCOCET) 10-325 MG tablet Take 1 tablet by mouth every 6 (six) hours as needed for pain. 12/03/19   [provider]  OXYGEN Inhale 2-3 L into the lungs continuous.    [provider]  oxymetazoline (AFRIN NASAL SPRAY) 0.05 % nasal spray Place 1 spray into both nostrils 2 (two) times daily. 10/04/20   Pearson Grippe, DO  polyethylene glycol (MIRALAX / GLYCOLAX) 17 g packet Take 17 g by mouth daily as needed for mild constipation.    [provider]  potassium chloride SA (K-DUR,KLOR-CON) 20 MEQ tablet Take 1 tablet (20 mEq total) by mouth 2 (two) times daily. 07/24/18   [name], MD  rosuvastatin (CRESTOR) 10  MG tablet Take  10 mg by mouth every evening. 11/19/19   [provider]  senna-docusate (SENNA-PLUS) 8.6-50 MG tablet Take 1 tablet by mouth daily.    [provider]  torsemide (DEMADEX) 20 MG tablet TAKE 4 (FOUR) TABLETS BY MOUTH DAILY (2AM+ 2NOON) 11/10/20   [name], MD  traZODone (DESYREL) 50 MG tablet Take 100 mg by mouth at bedtime. 11/19/19   [provider]  triamcinolone cream (KENALOG) 0.1 % Apply 1 application topically 2 (two) times daily. 09/10/20   [provider]  umeclidinium-vilanterol (ANORO ELLIPTA) 62.5-25 MCG/INH AEPB Inhale 1 puff into the lungs daily.    [provider]    Allergies    Bee venom, Fioricet [butalbital-apap-caffeine], Ibuprofen, Lamisil [terbinafine], and Nsaids  Review of Systems   Review of Systems  Constitutional:  Negative for chills and fever.  HENT:  Negative for ear pain and sore throat.   Eyes:  Negative for pain and visual disturbance.  Respiratory:  Positive for cough, shortness of breath and wheezing.   Cardiovascular:  Negative for chest pain and palpitations.  Gastrointestinal:  Negative for abdominal pain and vomiting.  Genitourinary:  Negative for dysuria and hematuria.  Musculoskeletal:  Negative for arthralgias and back pain.  Skin:  Negative for color change and rash.  Neurological:  Negative for seizures and syncope.  All other systems reviewed and are negative.  Physical Exam Updated Vital Signs BP 108/64   Pulse 84   Temp 98.8 F (37.1 C) (Oral)   Resp 18   SpO2 95%   Physical Exam Vitals and nursing note reviewed.  Constitutional:      General: She is not in acute distress.    Appearance: She is well-developed.  HENT:     Head: Normocephalic and atraumatic.  Eyes:     Conjunctiva/sclera: Conjunctivae normal.  Cardiovascular:     Rate and Rhythm: Normal rate and regular rhythm.     Heart sounds: No murmur heard. Pulmonary:     Effort: Pulmonary effort is normal. No  respiratory distress.     Breath sounds: Examination of the right-upper field reveals wheezing. Examination of the left-upper field reveals wheezing. Wheezing present.  Abdominal:     Palpations: Abdomen is soft.     Tenderness: There is no abdominal tenderness.  Musculoskeletal:     Cervical back: Neck supple.  Skin:    General: Skin is warm and dry.  Neurological:     Mental Status: She is alert.    ED Results / Procedures / Treatments   Labs (all labs ordered are listed, but only abnormal results are displayed) Labs Reviewed  CBG MONITORING, ED - Abnormal; Notable for the following components:      Result Value   Glucose-Capillary 116 (*)    All other components within normal limits    EKG None  Radiology No results found.  Procedures Procedures   Medications Ordered in ED Medications - No data to display  ED Course  I have reviewed the triage vital signs and the nursing notes.  Pertinent labs & imaging results that were available during my care of the patient were reviewed by me and considered in my medical decision making (see chart for details).    MDM Rules/Calculators/A&P                           Patient seen in the emergency department for evaluation of cough and shortness of breath.  Physical exam reveals  wheezing in the upper lobes bilaterally and decreased breath sounds in the lower lobes.  Cardiac exam unremarkable.  Chest x-ray obtained that shows chronic atelectasis at the bases and scarring likely from patient's chronic COPD but no obvious evidence of pneumonia.  Laboratory evaluation is currently pending.  She was given methylprednisolone and a DuoNeb for her wheezing and the patient was signed out to oncoming provider.  Please see oncoming provider signout note for continuation of work-up.  Patient signed out with stable vital signs and improving respiratory distress. Final Clinical Impression(s) / ED Diagnoses Final diagnoses:  None    Rx / DC  Orders ED Discharge Orders     None        Khalfani Weideman, Debe Coder, MD 11/10/20 2351

## 2020-11-11 DIAGNOSIS — I110 Hypertensive heart disease with heart failure: Secondary | ICD-10-CM | POA: Diagnosis not present

## 2020-11-11 LAB — CBC WITH DIFFERENTIAL/PLATELET
Abs Immature Granulocytes: 0.09 10*3/uL — ABNORMAL HIGH (ref 0.00–0.07)
Basophils Absolute: 0.1 10*3/uL (ref 0.0–0.1)
Basophils Relative: 1 %
Eosinophils Absolute: 0.2 10*3/uL (ref 0.0–0.5)
Eosinophils Relative: 2 %
HCT: 36.7 % (ref 36.0–46.0)
Hemoglobin: 11 g/dL — ABNORMAL LOW (ref 12.0–15.0)
Immature Granulocytes: 1 %
Lymphocytes Relative: 24 %
Lymphs Abs: 2.6 10*3/uL (ref 0.7–4.0)
MCH: 28.9 pg (ref 26.0–34.0)
MCHC: 30 g/dL (ref 30.0–36.0)
MCV: 96.3 fL (ref 80.0–100.0)
Monocytes Absolute: 1.2 10*3/uL — ABNORMAL HIGH (ref 0.1–1.0)
Monocytes Relative: 11 %
Neutro Abs: 6.7 10*3/uL (ref 1.7–7.7)
Neutrophils Relative %: 61 %
Platelets: 273 10*3/uL (ref 150–400)
RBC: 3.81 MIL/uL — ABNORMAL LOW (ref 3.87–5.11)
RDW: 14 % (ref 11.5–15.5)
WBC: 10.8 10*3/uL — ABNORMAL HIGH (ref 4.0–10.5)
nRBC: 0 % (ref 0.0–0.2)

## 2020-11-11 LAB — RESP PANEL BY RT-PCR (FLU A&B, COVID) ARPGX2
Influenza A by PCR: NEGATIVE
Influenza B by PCR: NEGATIVE
SARS Coronavirus 2 by RT PCR: POSITIVE — AB

## 2020-11-11 LAB — BASIC METABOLIC PANEL
Anion gap: 12 (ref 5–15)
BUN: 16 mg/dL (ref 6–20)
CO2: 33 mmol/L — ABNORMAL HIGH (ref 22–32)
Calcium: 8.3 mg/dL — ABNORMAL LOW (ref 8.9–10.3)
Chloride: 92 mmol/L — ABNORMAL LOW (ref 98–111)
Creatinine, Ser: 0.61 mg/dL (ref 0.44–1.00)
GFR, Estimated: >60 mL/min (ref >60)
Glucose, Bld: 132 mg/dL — ABNORMAL HIGH (ref 70–99)
Potassium: 3.5 mmol/L (ref 3.5–5.1)
Sodium: 137 mmol/L (ref 135–145)

## 2020-11-11 LAB — TROPONIN I (HIGH SENSITIVITY)
Troponin I (High Sensitivity): 3 ng/L (ref <18)
Troponin I (High Sensitivity): 4 ng/L (ref <18)

## 2020-11-11 LAB — BRAIN NATRIURETIC PEPTIDE: B Natriuretic Peptide: 14.4 pg/mL (ref 0.0–100.0)

## 2020-11-11 MED ORDER — DIPHENHYDRAMINE HCL 50 MG/ML IJ SOLN: 50.0000 mg | Freq: Once | INTRAMUSCULAR | Status: DC | PRN

## 2020-11-11 MED ORDER — SODIUM CHLORIDE 0.9 % IV SOLN: INTRAVENOUS | Status: DC | PRN

## 2020-11-11 MED ORDER — OXYCODONE HCL 5 MG PO TABS
10.0000 mg | ORAL_TABLET | Freq: Once | ORAL | Status: AC
  Administered 2020-11-11: 10 mg via ORAL
  Filled 2020-11-11: qty 2

## 2020-11-11 MED ORDER — BEBTELOVIMAB 175 MG/2 ML IV (EUA)
175.0000 mg | Freq: Once | INTRAMUSCULAR | Status: AC
  Administered 2020-11-11: 175 mg via INTRAVENOUS
  Filled 2020-11-11: qty 2

## 2020-11-11 MED ORDER — EPINEPHRINE 0.3 MG/0.3ML IJ SOAJ: 0.3000 mg | Freq: Once | INTRAMUSCULAR | Status: DC | PRN

## 2020-11-11 MED ORDER — ALBUTEROL SULFATE HFA 108 (90 BASE) MCG/ACT IN AERS: 2.0000 | INHALATION_SPRAY | Freq: Once | RESPIRATORY_TRACT | Status: DC | PRN

## 2020-11-11 MED ORDER — IPRATROPIUM-ALBUTEROL 0.5-2.5 (3) MG/3ML IN SOLN
3.0000 mL | Freq: Once | RESPIRATORY_TRACT | Status: AC
  Administered 2020-11-11: 3 mL via RESPIRATORY_TRACT
  Filled 2020-11-11: qty 3

## 2020-11-11 MED ORDER — ACETAMINOPHEN 500 MG PO TABS
1000.0000 mg | ORAL_TABLET | Freq: Once | ORAL | Status: AC
  Administered 2020-11-11: 1000 mg via ORAL
  Filled 2020-11-11: qty 2

## 2020-11-11 MED ORDER — FAMOTIDINE IN NACL 20-0.9 MG/50ML-% IV SOLN: 20.0000 mg | Freq: Once | INTRAVENOUS | Status: DC | PRN

## 2020-11-11 MED ORDER — PREDNISONE 20 MG PO TABS: 40.0000 mg | ORAL_TABLET | Freq: Every day | ORAL | 0 refills | Status: DC

## 2020-11-11 MED ORDER — METHYLPREDNISOLONE SODIUM SUCC 125 MG IJ SOLR: 125.0000 mg | Freq: Once | INTRAMUSCULAR | Status: DC | PRN

## 2020-11-11 NOTE — ED Notes (Signed)
Respiratory reported that there are no more peak flow instruments in the hospital currently and that they are on order.  MD made aware.

## 2020-11-11 NOTE — ED Notes (Signed)
Called the [employer] company per pt request for discharge.

## 2020-11-11 NOTE — ED Provider Notes (Signed)
Patient signed out pending reassessment lab work.  In brief presented with worsening shortness of breath.  Was on home oxygen requirement.  Was wheezing on exam and has a history of COPD.  Chest x-ray is at baseline.  Labs reviewed and largely reassuring.  She did test today positive for COVID-19.  This could be an explanation for her increased shortness of breath.  She is requiring her baseline home O2.  She is afebrile and vital signs are otherwise reassuring.  Patient was offered monoclonal antibody therapy.  This was administered while in the ED as she is high risk.  She tolerated this well.  We will send home with a burst dose of steroids given likely COPD exacerbation as well.  Physical Exam  BP 103/83   Pulse 91   Temp 98.8 F (37.1 C) (Oral)   Resp 18   SpO2 100%   Physical Exam Obese, nontoxic-appearing Wheezing ED Course/Procedures     Procedures CRITICAL CARE Performed by: Merryl Hacker   Total critical care time: 30 minutes  Critical care time was exclusive of separately billable procedures and treating other patients.  Critical care was necessary to treat or prevent imminent or life-threatening deterioration.  Critical care was time spent personally by me on the following activities: development of treatment plan with patient and/or surrogate as well as nursing, discussions with consultants, evaluation of patient's response to treatment, examination of patient, obtaining history from patient or surrogate, ordering and performing treatments and interventions, ordering and review of laboratory studies, ordering and review of radiographic studies, pulse oximetry and re-evaluation of patient's condition.  MDM   Problem List Items Addressed This Visit       Respiratory   COPD exacerbation (HCC)   Relevant Medications   diphenhydrAMINE (BENADRYL) injection 50 mg   methylPREDNISolone sodium succinate (SOLU-MEDROL) 125 mg/2 mL injection 125 mg   albuterol (VENTOLIN  HFA) 108 (90 Base) MCG/ACT inhaler 2 puff   predniSONE (DELTASONE) 20 MG tablet   Other Visit Diagnoses     COVID-19    -  Primary            Merryl Hacker, MD 11/11/20 [phone]

## 2020-11-11 NOTE — ED Notes (Signed)
Pt coughing/wheezing and asking for another breathing treatment.  Dr.Horton aware, second neb ordered.

## 2020-11-11 NOTE — Discharge Instructions (Addendum)
You were seen today for worsening shortness of breath.  You were found to be COVID-positive.  This may be contributing.  You were given monoclonal antibodies in the emergency room.  Make sure to quarantine until symptoms resolve.  You may use your supplemental oxygen for comfort as needed although your vital signs here were reassuring.  Use your inhaler every 4 hours.  Take steroids as directed.

## 2020-11-12 ENCOUNTER — Other Ambulatory Visit: Payer: Medicaid Other | Admitting: Obstetrics and Gynecology

## 2020-11-12 ENCOUNTER — Encounter (HOSPITAL_BASED_OUTPATIENT_CLINIC_OR_DEPARTMENT_OTHER): Payer: Medicaid Other | Admitting: Internal Medicine

## 2020-11-12 DIAGNOSIS — I110 Hypertensive heart disease with heart failure: Secondary | ICD-10-CM | POA: Diagnosis not present

## 2020-11-12 NOTE — Patient Outreach (Signed)
Care Coordination  11/12/2020  Ithzel Engledow 06/02/1960 [id number]  RNCM returned patient's phone call-no answer, left message.  [name] RN, BSN [place]  [facility] - Managed Medicaid High Risk [phone].

## 2020-11-22 ENCOUNTER — Other Ambulatory Visit: Payer: Self-pay

## 2020-11-22 NOTE — Patient Instructions (Signed)
Visit Information  Ms. Goslin was given information about Medicaid Managed Care team care coordination services as a part of their [facility] Medicaid benefit. [name] verbally consented to engagement with the [facility] Managed Care team.   If you are experiencing a medical emergency, please call 911 or report to your local emergency department or urgent care.   If you have a non-emergency medical problem during routine business hours, please contact your provider's office and ask to speak with a nurse.   For questions related to your [facility] health plan, please call: [phone] or go here:[email or web address]  If you would like to schedule transportation through your [facility] plan, please call the following number at least 2 days in advance of your appointment: [phone].  Call the [facility] at [phone], at any time, 24 hours a day, 7 days a week. If you are in danger or need immediate medical attention call 911.  If you would like help to quit smoking, call 1-800-QUIT-NOW (786)572-0031) OR Espaol: 1-855-Djelo-Ya HD:1601594) o para ms informacin haga clic aqu or Text READY to 200-400 to register via text  Ms. Yearby - following are the goals we discussed in your visit today:   Goals Addressed   None     Please see education materials related to DM provided as print materials.   Patient verbalizes understanding of instructions provided today.   The Managed Medicaid care management team will reach out to the patient again over the next 60 days.   Arizona Constable, Pharm.D., Managed Medicaid Pharmacist [phone]   Following is a copy of your plan of care:  Patient Care Plan: General Plan of Care (Adult)     Problem Identified: Health Promotion or Disease Self-Management (General Plan of Care)   Priority: Medium  Onset Date: 04/23/2020     Problem Identified: Barriers to Treatment   Priority: High  Onset  Date: 04/23/2020     Long-Range Goal: Barriers to Treatment Identified and Managed   Start Date: 05/03/2020  Expected End Date: 11/30/2020  Recent Progress: On track  Priority: Medium  Note:   CARE PLAN ENTRY Medicaid Managed Care (see longitudinal plan of care for additional care plan information)  Current Barriers:  Transportation barriers-patient utilizing [facility] and [employer] as needed. Limited access to caregiver-patient needs PCS, currently not receiving services she is expecting. Patient choosing not to use aides provided to her-discussed finding another agency with patient. Update 10/26/20:  Patient states aide is coming M-F 10-12-"Lucy"  Nurse Clinical Goal(s):  Over the next 30 days, patient will work with [employer] to address needs related to transportation. Update 05/12/20:  Patient has used [employer] and is very pleased. Over the next 90 days, patient will attend all scheduled medical appointments. RNCM will make referral to SW for assistance with PCS Update 05/20/20:  Patient states she is now receiving PCS from [facility] 3 days a week for 4 hours each day. Update 05/27/20: BSW spoke with patient about PCS, she stated someone usually comes in 3 days a week for a few hours. She stated her aide was off today. BSW contacted [place] and spoke with [facility], she stated that patient has 13 hours per week with 6 days a week, however patient chooses to have an aide come out 3 days a week so that she can have more hours. The aide assist with most ADL's. Update 06/04/20: Patient stated she is ready to move to [facility].  She has already started the process but wants to save some money first and she has to wait on her nephew who will drive the uhaul truck. Update 06/24/20: BSW contacted patient to follow up on her moving to [facility], she stated she is still saving money. Patient did cut call short because she was  sleeping, patient stated she was not in need of any resources at this time. Update 08/27/20: Bsw received information from [facility] about patient needing some batteries for her remote. BSW contacted patient to see if any resources were needed. Patient stated she contacted [employer] and they informed her it would cost 40 dollars to replace her remote, patient states she only needs batteries. BSW asked if patient had anyone to call that could bring her some batteries, patient stated no. She states her daughter is mad at her right now and working so she does not talk to her, patient states she has not family to call. BSW asked if her aide could help, patient states she has not had an aide in about a week, she had to let her last aide go because she smelled like marijuana and they have not replaced her. BSW contacted patients healthcare agency [facility] at [phone] and left a voicemail. Update 09/30/20:  Patient has aides available to her at [facility] but chooses not to use them. Discussion with [facility] and they continue to look for another aide for Ms. Woltz. Discussed with patient about her finding a new agency. 10/05/20: BSW received a telephone call from [facility] with patient on the line. Patient stated that she went to the ED and her eye was still hurting from a fall. Patient stated she has the information for her eye doctor and was getting ready to contact them. Patient stated she has not recieieved an aide since last Thursday.  Update 10/26/20:  Patient now has aide coming M-F 10-12, "Lucy"  Interventions: Inter-disciplinary care team collaboration (see longitudinal plan of care) Referred patient to community resources care guide team for assistance with transportation. Provided patient with information about [facility] and [employer]. Discussed plans with patient for ongoing care management follow up and provided patient with direct contact information for care  management team Advised patient to call [place] or [employer]. Collaborated with [employer] and [place] regarding transportation. Assisted patient/caregiver with obtaining information about health plan benefits Collaborated with Social Work regarding PCS. Update 08/30/20:  Patient continues to have issues with PCS-SW is investigating. Update 06/14/20:  Patient continues to utilize [employer], aide, [employer] as needed-has missed appointments due to transportation "no shows"-will continue to follow. Update 08/30/20:  patient has been successful in attending appointments with [facility] and [facility] transportation. Update 09/30/20:  Patient has upcoming appointments with MM Pharmacist and SW. Patient Self Care Activities:  Patient will call [facility]. Update 06/07/20:  Patient continues to utilize [facility] with success. Update 06/14/20:  Patient has missed appointments due to transportation no shows.  [facility] transportation has been utilized as needed. Update 09/30/20:  Patient with no transportation problems right now-uses [employer] or [place] as needed for appointments with success.       Task: Alleviate Barriers to Mental Health Treatment   Note:   Care Management Activities:    - willingness to receive help encouraged    Notes:     Patient Care Plan: General Plan of Care (Adult)     Problem Identified: Health Promotion or Disease Self-Management (General  Plan of Care)   Priority: High  Onset Date: 04/23/2020     Long-Range Goal: Self-Management Plan Developed   Start Date: 04/23/2020  Expected End Date: 11/30/2020  Recent Progress: On track  Priority: Medium  Note:   Current Barriers:  Chronic Disease Management support and education needs.  Patient with abdominal wall infection-seen at [facility] ED 05/24/20 - I&D performed. Update 06/01/20:  Patient states she is improving-HHRN is cleaning and  dressing infection. Update 06/07/20:  Infection continues to improve per patient, HHRN continues to clean and dress infection site. Update 06/17/20:  Infection improved, healed. Update 10/26/20:  Patient with recent fall injuring eye and ankle-patient states eye is healing and she is seeing provider today about ankle and knee.  Nurse Case Manager Clinical Goal(s):  Over the next 30 days, patient will attend all scheduled medical appointments:  Over the next 30 days, patient will work with CM team pharmacist to review medications. Update 05/20/20:  Patient has appointment with Pharmacist 05/21/20. Update 05/25/20:  Patient declines to speak with Pharmacist at this time. Update 08/30/20:  Patient is working with Pharmacist.  Interventions:  Inter-disciplinary care team collaboration (see longitudinal plan of care) Evaluation of current treatment plan and patient's adherence to plan as established by provider. Reviewed medications with patient. Collaborated with pharmacist regarding medications. Discussed plans with patient for ongoing care management follow up and provided patient with direct contact information for care management team Reviewed scheduled/upcoming provider appointments.  Update 05/20/20:  Patient unable to attend PT appointment 05/19/20, has next appointment 05/26/20.  Has Cardiology appointment 05/28/20.  Reviewed [place] information with patient again and with aide. Update 06/07/20:  Patient has PT appointment 06/09/20. Update 06/17/20:  patient attended PT appointment 06/16/20 and also attended appointment at [facility] this week-patient states she feels better. Pharmacy referral for medication review. Update 05/24/20: Pharmacist has attempted to speak with patient and has been unable to.  Patient at Urgent Care today with stomach pain.  RNCM will reschedule appointment with patient and speak with her regarding importance of speaking with Pharmacist. Update 05/25/20:  Patient declines to speak with Pharmacist at this time. Update 08/30/20:  Patient is working with Pharmacist for medication management. Update 09/30/20:  Patient without complaint today, has upcoming appointments with MM Pharmacist and SW.  Patient Goals/Self-Care Activities Over the next 30 days, patient will:  -Attends all scheduled provider appointments Calls pharmacy for medication refills Calls provider office for new concerns or questions  Follow Up Plan: The Managed Medicaid care management team will reach out to the patient again over the next 30 days.  The patient has been provided with contact information for the Managed Medicaid care management team and has been advised to call with any health related questions or concerns.        Task: Mutually Develop and Royce Macadamia Achievement of Patient Goals   Note:   Care Management Activities:    - verbalization of feelings encouraged    Notes:     Patient Care Plan: Medication Management     Problem Identified: Health Promotion or Disease Self-Management (General Plan of Care)      Goal: Medication Management   Note:   Current Barriers:  Does not adhere to prescribed medication regimen Does not maintain contact with provider office Does not contact provider office for questions/concerns   Pharmacist Clinical Goal(s):  Over the next 60 days, patient will achieve adherence to monitoring guidelines and medication adherence to achieve therapeutic efficacy contact provider  office for questions/concerns as evidenced notation of same in electronic health record through collaboration with PharmD and provider.    Interventions: Inter-disciplinary care team collaboration (see longitudinal plan of care) Comprehensive medication review performed; medication list updated in electronic medical record  '@RXCPDIABETES'$ @ '@RXCPHYPERTENSION'$ @ '@RXCPHYPERLIPIDEMIA'$ @  Patient Goals/Self-Care Activities Over the next 30 days, patient will:  -  take medications as prescribed collaborate with provider on medication access solutions  Follow Up Plan: The care management team will reach out to the patient again over the next 30 days.      Task: Mutually Develop and Royce Macadamia Achievement of Patient Goals   Note:   Care Management Activities:    - verbalization of feelings encouraged    Notes:

## 2020-11-22 NOTE — Patient Outreach (Signed)
Medicaid Managed Care    Pharmacy Note  11/22/2020 Name: Michele Owens MRN: [id number] DOB: 06/23/1960  Michele Owens is a 60 y.o. year old female who is a primary care patient of [name], MD. The [facility] Managed Care Coordination team was consulted for assistance with disease management and care coordination needs.    Engaged with patient Engaged with patient by telephone for follow up visit in response to referral for case management and/or care coordination services.  Michele Owens was given information about Managed Medicaid Care Coordination team services today. Michele Owens agreed to services and verbal consent obtained.   Objective:  Lab Results  Component Value Date   CREATININE 0.61 11/11/2020   CREATININE 0.57 10/19/2020   CREATININE 0.70 10/03/2020    Lab Results  Component Value Date   HGBA1C 6.2 (H) 04/19/2020       Component Value Date/Time   CHOL 226 (H) 08/05/2017 0318   TRIG 59 08/05/2017 0318   HDL 80 08/05/2017 0318   CHOLHDL 2.8 08/05/2017 0318   VLDL 12 08/05/2017 0318   LDLCALC 134 (H) 08/05/2017 0318    Other: (TSH, CBC, Vit D, etc.)  Clinical ASCVD: No  The ASCVD Risk score Michele Owens DC Jr., et al., 2013) failed to calculate for the following reasons:   Cannot find a previous HDL lab   Cannot find a previous total cholesterol lab    Other: (CHADS2VASc if Afib, PHQ9 if depression, MMRC or CAT for COPD, ACT, DEXA)  BP Readings from Last 3 Encounters:  11/11/20 114/70  10/19/20 (!) 107/51  10/19/20 (!) 113/53    Assessment/Interventions: Review of patient past medical history, allergies, medications, health status, including review of consultants reports, laboratory and other test data, was performed as part of comprehensive evaluation and provision of chronic care management services.    HTN BP Readings from Last 3 Encounters:  11/11/20 114/70  10/19/20 (!) 107/51  10/19/20 (!) 113/53   Carvedilol 6.28m BID Lisinopril 524m QD Plan: At goal,  patient stable/ symptoms controlled   Edema Torsemide 2038m2AM and 2Noon) KCl Plan: At goal,  patient stable/ symptoms controlled   DM Lab Results  Component Value Date   HGBA1C 6.2 (H) 04/19/2020   HGBA1C 6.5 (H) 04/11/2020   HGBA1C 6.6 (H) 07/09/2018   Lab Results  Component Value Date   LDLCALC 134 (H) 08/05/2017   CREATININE 0.61 11/11/2020    Lab Results  Component Value Date   NA 137 11/11/2020   K 3.5 11/11/2020   CREATININE 0.61 11/11/2020   GFRNONAA >60 11/11/2020   GFRAA >60 09/30/2019   GLUCOSE 132 (H) 11/11/2020    Metformin 500m73mBID May 2022: Patient has been having issues with testing, unable to use new monitor. Coordinated with [name]), and OffiGlass blower/designerPCP'BlueLinxice who did a home visit to show patient how to use test strips this week. Called patient today and she is having no issues testing now. This was the main thing we went over today June 2022: Sugar readings are 105 111 125 100 July 2022: Called patient for visit but she stated, "I'm getting my hair done, I can't talk now. But [facility] two sugar pills in my packs. Can you look into it?." Typically, [employer] been very detailed and helpful in the past. I called and spoke with Diedee and she will reach out to the patient in an hour. August 2022: Plan: At goal,  patient  stable/ symptoms controlled   Lipids Lab Results  Component Value Date   CHOL 226 (H) 08/05/2017   CHOL 167 09/23/2016   Lab Results  Component Value Date   HDL 80 08/05/2017   HDL 39 (L) 09/23/2016   Lab Results  Component Value Date   LDLCALC 134 (H) 08/05/2017   LDLCALC 93 09/23/2016   Lab Results  Component Value Date   TRIG 59 08/05/2017   TRIG 176 (H) 09/23/2016   TRIG 388 (H) 07/22/2016   Lab Results  Component Value Date   CHOLHDL 2.8 08/05/2017   CHOLHDL 4.3 09/23/2016   No results found for: LDLDIRECT  Rosuvastatin 38m Plan:  unable to see labs from PCP's EHR   Pain Left Foot:  Pain Score:  May 2022: 8/17 September 2020: 9/10 Oxycodone 10/325 Cyclobenzaprine March 2022: Missed PT appointment and now can't go back (Per patient). June 2022: Still hasn't re-scheduled PT  Insomnia Trazodone 1043m-patient not sleeping due to stress of her son passing June 2022: Patient stated isn't sleeping because there are people outside her house making noise   Gout Allopurinol 10047mStates has gout attack daily but then described OA pain and how her bones rub against each other in her knees and hips March 2022: Ask for Uric Acid Labs April 2022: No labs in yet  Thyroid Levothyroxine 112m55mQD April 2022: Patient hasn't had meds in 3 weeks. Called Pharmacy and they will deliver today May 2022: Patient is compliant and taking meds as directed Plan: At goal,  patient stable/ symptoms controlled   Misc: August 2022: Visit was cut short, during visit she found out a family friend killed themself and obviously, that was the priority. Patient did state earlier in visit that she was content with meds and therapy. She did get Covid at beginning of month but, "I'm back at 100% now"   SDOH (Social Determinants of Health) assessments and interventions performed:    Care Plan  Allergies  Allergen Reactions   Bee Venom Swelling and Other (See Comments)    "All over my body" (swelling)   Fioricet [Butalbital-Apap-Caffeine] Nausea And Vomiting and Rash   Ibuprofen Rash and Other (See Comments)    Severe rash   Lamisil [Terbinafine] Rash and Other (See Comments)    Pt states this causes her to "feel funny"   Nsaids Other (See Comments)    Per MD's orders     Medications Reviewed Today     Reviewed by CrafGayla Medicus (Registered Nurse) on 11/05/20 at 1527  Med List Status: <None>   Medication Order Taking? Sig Documenting Provider Last Dose Status Informant  albuterol (PROVENTIL HFA;VENTOLIN HFA) 108 (90 Base)  MCG/ACT inhaler [id number] 1-2 puffs into the lungs every 6 (six) hours as needed for wheezing or shortness of breath. LongMargette Fast 08/13/2020 Unknown time Active Self           Med Note (ATKRosine Beat 27, 2022  8:43 PM) LF 2Gabriel Owens/2022 [facility] per external pharmacy records   allopurinol (ZYLOPRIM) 100 MG tablet [id number] 100 mg by mouth daily. [provider] 08/13/2020 Unknown time Active Self           Med Note (ATKOrvan SeenATHER L   Mon Oct 04, 2020  8:30 PM) #30 filled 09/10/2020 [facility] per external pharmacy records   benzonatate (TESSALON) 200 MG capsule [id number] 1 capsule (200 mg total) by mouth in the  morning, at Owens, and at bedtime. 7 DS Elmarie Shiley, MD 08/13/2020 Unknown time Active Self           Med Note Orvan Seen, HEATHER L   Mon Oct 04, 2020  8:30 PM) #21 filled 09/27/2020 [facility] per external pharmacy records  Blood Glucose Monitoring Suppl (ACCU-CHEK AVIVA PLUS) w/Device KIT [id number] No 1 each by Other route daily.  [provider] unk unk Active Self  butalbital-acetaminophen-caffeine (FIORICET) 50-325-40 MG tablet [id number] No Take 1 tablet by mouth 2 (two) times daily as needed for migraine. [provider] Past Month Unknown time Active Self           Med Note Rosine Beat Oct 04, 2020  8:31 PM) #60/30 days filled 09/10/2020 [facility] per external pharmacy records   calcitRIOL (ROCALTROL) 0.5 MCG capsule [id number] No Take 1 capsule (0.5 mcg total) by mouth daily. Modena Jansky, MD 08/13/2020 Unknown time Active Self           Med Note Rosine Beat Oct 04, 2020  8:31 PM) #30 filled 09/10/2020 [facility] per external pharmacy records   carvedilol (COREG) 6.25 MG tablet [id number] No Take 1 tablet (6.25 mg total) by mouth 2 (two) times daily with a meal. Bensimhon, [name], MD 08/13/2020 3 am Active Self           Med Note Payton Doughty   Mon Oct 04, 2020  8:31 PM) #60 filled 09/10/2020 [facility] per external pharmacy records   clonazePAM (KLONOPIN) 2 MG tablet [id number] No Take 1 tablet (2 mg total) by mouth 3 (three) times daily. for anxiety  Patient taking differently: Take 2 mg by mouth 3 (three) times daily.   [name], MD 08/13/2020 Unknown time Active            Med Note Rosine Beat Oct 04, 2020  8:25 PM) #90/30 days filled 09/07/2020 [facility] per PMP AWARE  cyclobenzaprine (FLEXERIL) 10 MG tablet [id number] No Take 10 mg by mouth 3 (three) times daily. [provider] 08/13/2020 Unknown time Active Self           Med Note Orvan Seen, HEATHER L   Mon Oct 04, 2020  8:32 PM) #90/30 days filled 09/10/2020 [facility] per external pharmacy records   diclofenac sodium (VOLTAREN) 1 % GEL [id number] No Apply 2 g topically 4 (four) times daily as needed (for pain).  Patient not taking: No sig reported   [name], MD Not Taking Unknown time Active Self  EPINEPHrine 0.3 mg/0.3 mL IJ SOAJ injection [id number] No Inject 0.3 mg into the muscle once as needed for anaphylaxis. [provider] unk last dose Active Self           Med Note Rosine Beat Oct 04, 2020  8:41 PM) LF 07/20/2020 [facility] per external pharmacy records   FEROSUL 325 (65 Fe) MG tablet [id number] No Take 325 mg by mouth daily. [provider] 08/13/2020 Unknown time Active Self           Med Note Rosine Beat Oct 04, 2020  8:32 PM) #30 filled 09/10/2020 [facility] per external pharmacy records   glucose blood test strip [id number] No Use as instructed to check blood sugar up to 4 times daily  Patient taking differently: 1 each by Other route See admin instructions. Use as instructed to  check blood sugar up to 4 times daily   [name], DO unk unk Active Self  guaiFENesin (MUCINEX) 600 MG 12 hr tablet [id number] No Take 2 tablets (1,200 mg total) by mouth 2 (two) times daily. Regalado,  Belkys A, MD 08/13/2020 Unknown time Active Self  ipratropium-albuterol (DUONEB) 0.5-2.5 (3) MG/3ML SOLN [id number] No Take 3 mLs by nebulization 4 (four) times daily.  [provider] 08/12/2020 Unknown time Active Self           Med Note Rosine Beat Oct 04, 2020  8:43 PM) LF 05/17/2020 [facility] per external pharmacy records   levothyroxine (SYNTHROID, LEVOTHROID) 112 MCG tablet [id number] No Take 224 mcg by mouth daily before breakfast. [provider] 08/13/2020 Unknown time Active Self           Med Note Orvan Seen, HEATHER L   Mon Oct 04, 2020  8:33 PM) #60/30 days filled 09/10/2020 [facility] per external pharmacy records   lisinopril (PRINIVIL,ZESTRIL) 5 MG tablet [id number] No Take 1 tablet (5 mg total) by mouth daily. Please call for office visit [phone]  Patient taking differently: Take 5 mg by mouth daily.   [name], MD 08/13/2020 Unknown time Active            Med Note Rosine Beat Oct 04, 2020  8:33 PM) #30 filled 09/10/2020 [facility] per external pharmacy records   loperamide (IMODIUM) 2 MG capsule [id number] No Take 1 capsule (2 mg total) by mouth 4 (four) times daily as needed for diarrhea or loose stools. [name], MD Past Month Unknown time Active Self  metFORMIN (GLUCOPHAGE) 500 MG tablet [id number] No Take 1 tablet (500 mg total) by mouth 2 (two) times daily with a meal. [name], MD 08/13/2020 Unknown time Active Self           Med Note Payton Doughty   Mon Oct 04, 2020  8:34 PM) #60 filled 09/10/2020 [facility] per external pharmacy records   nitroGLYCERIN (NITROSTAT) 0.4 MG SL tablet [id number] No Place 1 tablet (0.4 mg total) under the tongue every 5 (five) minutes as needed for chest pain. [name], MD unk last dose Active Self           Med Note Rosine Beat Oct 04, 2020  8:43 PM) LF 04/28/2020 [facility] per external pharmacy records   nystatin cream (MYCOSTATIN)  [id number] No Apply 1 application topically 2 (two) times daily as needed for dry skin. Elmarie Shiley, MD Past Week Unknown time Active Self           Med Note Rosine Beat Oct 04, 2020  8:42 PM) LF 07/19/2020 [facility] per external pharmacy records   nystatin ointment (MYCOSTATIN) [id number]  Apply 1 application topically 2 (two) times daily. [provider]  Active            Med Note Rosine Beat Oct 04, 2020  8:35 PM) LF 09/10/2020 [facility] per external pharmacy records   omeprazole (PRILOSEC) 20 MG capsule [id number] No Take 20 mg by mouth 2 (two) times daily before a meal. [provider] 08/13/2020 Unknown time Active Self           Med Note Rosine Beat Oct 04, 2020  8:35 PM) #60 filled 09/10/2020 [facility] per external pharmacy records   oxyCODONE-acetaminophen (PERCOCET) [phone]  MG tablet [id number] No Take 1 tablet by mouth every 6 (six) hours as needed for pain. [provider] 08/13/2020 Unknown time Active Self           Med Note Orvan Seen, HEATHER L   Mon Oct 04, 2020  8:25 PM) #120/30 days filled 09/10/2020 [facility] per PMP AWARE  OXYGEN [id number] No Inhale 2-3 L into the lungs continuous. [provider] 08/13/2020 Unknown time Active Self           Med Note Dorina Hoyer, [facility]   Mon Apr 12, 2020 10:08 AM)    oxymetazoline (AFRIN NASAL SPRAY) 0.05 % nasal spray [id number]  Place 1 spray into both nostrils 2 (two) times daily. Pearson Grippe, DO  Active   polyethylene glycol (MIRALAX / GLYCOLAX) 17 g packet [id number] No Take 17 g by mouth daily as needed for mild constipation. [provider] Past Week Unknown time Active Self           Med Note Orvan Seen, Sharlette Dense   Mon Oct 04, 2020  8:30 PM) 238 g filled 09/21/2020 [facility] per external pharmacy records   potassium chloride SA (K-DUR,KLOR-CON) 20 MEQ tablet [id number] No Take 1 tablet (20 mEq total) by mouth 2 (two) times daily.  [name], MD 08/13/2020 Unknown time Active Self           Med Note Rosine Beat Oct 04, 2020  8:36 PM) #60 filled 09/10/2020 [facility] per external pharmacy records   rosuvastatin (CRESTOR) 10 MG tablet [id number] No Take 10 mg by mouth every evening. [provider] 08/13/2020 Unknown time Active Self           Med Note Rosine Beat Oct 04, 2020  8:36 PM) #30 filled 09/10/2020 [facility] per external pharmacy records   senna-docusate (SENNA-PLUS) 8.6-50 MG tablet [id number]  Take 1 tablet by mouth daily. [provider]  Active            Med Note Rosine Beat Oct 04, 2020  8:37 PM) #30 filled 09/10/2020 [facility] per external pharmacy records   torsemide (DEMADEX) 20 MG tablet [id number] No TAKE 4 (FOUR) TABLETS BY MOUTH DAILY (2AM+ 2NOON)  Patient taking differently: Take 40 mg by mouth 2 (two) times daily with breakfast and lunch.   [name], MD 08/13/2020 Unknown time Active            Med Note Rosine Beat Oct 04, 2020  8:38 PM) #120/30 days filled 09/10/2020 [facility] per external pharmacy records   traZODone (DESYREL) 50 MG tablet [id number] No Take 100 mg by mouth at bedtime. [provider] 08/12/2020 Unknown time Active Self           Med Note Rosine Beat Oct 04, 2020  8:39 PM) #60 filled 09/10/2020 [facility] per external pharmacy records   triamcinolone cream (KENALOG) 0.1 % [id number]  Apply 1 application topically 2 (two) times daily. [provider]  Active            Med Note Rosine Beat Oct 04, 2020  8:40 PM) LF 09/10/2020 [facility] per external pharmacy records   umeclidinium-vilanterol [facility] ELLIPTA) 62.5-25 MCG/INH AEPB [id number]  Inhale 1 puff into the lungs daily. [provider]  Active            Med Note (ATKINS, HEATHER  L   Mon Oct 04, 2020  8:41 PM) 30DS filled 09/10/2020 [facility] per external pharmacy records              Patient Active Problem List   Diagnosis Date Noted   Acute on chronic respiratory failure with hypoxia and hypercapnia ([place]) 04/18/2020   Acute on chronic respiratory failure with hypoxia ([place]) 04/18/2020   Hemoptysis 56/21/3086   Acute metabolic encephalopathy 57/84/6962   Acute encephalopathy 04/18/2020   Chronic diastolic CHF (congestive heart failure) ([place]) 04/18/2020   Sepsis ([place]) 04/18/2020   Morbid obesity with BMI of 50.0-59.9, adult ([place])    Fall at home, initial encounter 06/23/2018   HNP (herniated nucleus pulposus), thoracic 06/23/2018   CHF (congestive heart failure) ([place]) 06/18/2018   Hypertensive disorder 08/28/2017   Obesity 08/28/2017   COPD exacerbation ([place]) 08/04/2017   Acute and chronic respiratory failure with hypercapnia ([place])    Pulmonary HTN ([place])    Genetic testing 02/27/2017   Family history of thyroid cancer    Hypomagnesemia 10/03/2016   Atypical chest pain    HLD (hyperlipidemia) 09/23/2016   Gout 09/23/2016   DVT (deep vein thrombosis) in pregnancy 09/23/2016   Thyroid cancer ([place]) 09/23/2016   Hypocalcemia 09/23/2016   AKI (acute kidney injury) ([place]) 09/23/2016   Acute pulmonary edema (H[facility])    Acute hypoxemic respiratory failure (H[facility])    Ventilator dependent ([place])    Chronic respiratory failure with hypoxia and hypercapnia ([place]) 07/16/2016   CAP (community acquired pneumonia) 01/23/2016   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 01/23/2016   Pulmonary hypertension ([place]) 01/23/2016   Obesity hypoventilation syndrome ([place]) 08/26/2015   Dyslipidemia associated with type 2 diabetes mellitus ([place]) 04/24/2015   Leukocytosis 04/24/2015   Controlled type 2 diabetes mellitus without complication, without long-term current use of insulin ([place]) 04/24/2015   Anxiety 04/24/2015   Benign essential HTN 04/24/2015   Normocytic anemia 04/24/2015   OSA (obstructive sleep apnea) 05/10/2012   Tobacco abuse 07/04/2011     Conditions to be addressed/monitored: HTN and DM  Care Plan : Medication Management  Updates made by Lane Hacker, [place] since 06/28/2020 12:00 AM     Problem: Health Promotion or Disease Self-Management (General Plan of Care)      Goal: Medication Management   Note:   Current Barriers:  Does not adhere to prescribed medication regimen Does not maintain contact with provider office Does not contact provider office for questions/concerns   Pharmacist Clinical Goal(s):  Over the next 30 days, patient will achieve adherence to monitoring guidelines and medication adherence to achieve therapeutic efficacy contact provider office for questions/concerns as evidenced notation of same in electronic health record through collaboration with PharmD and provider.    Interventions: Inter-disciplinary care team collaboration (see longitudinal plan of care) Comprehensive medication review performed; medication list updated in electronic medical record  _0 @ _1 @ _2 @  Patient Goals/Self-Care Activities Over the next 30 days, patient will:  - take medications as prescribed collaborate with provider on medication access solutions  Follow Up Plan: The care management team will reach out to the patient again over the next 30 days.      Task: Mutually Develop and Royce Macadamia Achievement of Patient Goals   Note:   Care Management Activities:    - verbalization of feelings encouraged    Notes:      Medication Assistance: None required. Patient affirms current coverage meets needs.   Follow up: Agree   Plan: The care management team  will reach out to the patient again over the next 30 days.   [place] Constable, Pharm.D., Managed Medicaid Pharmacist - [phone]

## 2020-11-26 ENCOUNTER — Encounter (HOSPITAL_BASED_OUTPATIENT_CLINIC_OR_DEPARTMENT_OTHER): Payer: Medicaid Other | Attending: Internal Medicine | Admitting: Internal Medicine

## 2020-11-26 ENCOUNTER — Other Ambulatory Visit: Payer: Self-pay | Admitting: Obstetrics and Gynecology

## 2020-11-26 ENCOUNTER — Other Ambulatory Visit: Payer: Self-pay

## 2020-11-26 DIAGNOSIS — L84 Corns and callosities: Secondary | ICD-10-CM | POA: Diagnosis not present

## 2020-11-26 DIAGNOSIS — E119 Type 2 diabetes mellitus without complications: Secondary | ICD-10-CM | POA: Insufficient documentation

## 2020-11-26 DIAGNOSIS — Z9981 Dependence on supplemental oxygen: Secondary | ICD-10-CM | POA: Insufficient documentation

## 2020-11-26 DIAGNOSIS — Z8631 Personal history of diabetic foot ulcer: Secondary | ICD-10-CM | POA: Insufficient documentation

## 2020-11-26 DIAGNOSIS — E11621 Type 2 diabetes mellitus with foot ulcer: Secondary | ICD-10-CM

## 2020-11-26 DIAGNOSIS — Z8616 Personal history of COVID-19: Secondary | ICD-10-CM | POA: Diagnosis not present

## 2020-11-26 DIAGNOSIS — Z86718 Personal history of other venous thrombosis and embolism: Secondary | ICD-10-CM | POA: Insufficient documentation

## 2020-11-26 DIAGNOSIS — S91301D Unspecified open wound, right foot, subsequent encounter: Secondary | ICD-10-CM

## 2020-11-26 DIAGNOSIS — Z7689 Persons encountering health services in other specified circumstances: Secondary | ICD-10-CM | POA: Diagnosis not present

## 2020-11-26 DIAGNOSIS — Z09 Encounter for follow-up examination after completed treatment for conditions other than malignant neoplasm: Secondary | ICD-10-CM | POA: Insufficient documentation

## 2020-11-26 NOTE — Patient Instructions (Signed)
Visit Information  Ms. Intriago was given information about Medicaid Managed Care team care coordination services as a part of their [facility] Medicaid benefit. [name] verbally consented to engagement with the [facility] Managed Care team.   If you are experiencing a medical emergency, please call 911 or report to your local emergency department or urgent care.   If you have a non-emergency medical problem during routine business hours, please contact your provider's office and ask to speak with a nurse.   For questions related to your [facility] health plan, please call: [phone] or go here:[email or web address]  If you would like to schedule transportation through your [facility] plan, please call the following number at least 2 days in advance of your appointment: [phone].  Call the [place] at [phone], at any time, 24 hours a day, 7 days a week. If you are in danger or need immediate medical attention call 911.  If you would like help to quit smoking, call 1-800-QUIT-NOW 713-242-9668) OR Espaol: 1-855-Djelo-Ya HD:1601594) o para ms informacin haga clic aqu or Text READY to 200-400 to register via text  Michele Owens - following are the goals we discussed in your visit today:   Goals Addressed             This Visit's Progress    Protect My Health       Timeframe:  Long-Range Goal Priority:  Medium Start Date:    04/23/20                         Expected End Date: ongoing           Follow Up Date: 12/27/20   - schedule appointment for vaccines needed due to my age or health - schedule recommended health tests (blood work, mammogram, colonoscopy, pap test) - schedule and keep appointment for annual check-up  Patient has PT appointment 05/19/20. Update 05/20/20:  Patient unable to attend PT appointment on 05/19/20.  Has next PT appointment 05/26/20.  Cardiology appointment 05/28/20. Update 05/24/20:  Attempted to reach  patient today-she is at Urgent Care with stomach pain.  Will reschedule. Update 05/25/20:  Patient seen at Urgent Care and [place] 05/24/20.  Patient with abscess- given antibiotics. Update 06/01/20:  Patient continues to improve, HHRN cleaning and changing dressing. Update 06/07/20:  Patient states abscess continues to improve.  Has PT on Wednesday, 06/09/20. Update 06/14/20:  Abscess healed.  Patient missed PT appointment as transportation did not pick her up-appointment rescheduled to 06/16/20. Update 06/17/20:  Patient attended PT appointment yesterday and also attended appointment at [place] this week-Patient states she feels better. Update 08/24/20:  Patient has appointment at [facility] 08/25/20 at 0930. Update 08/30/20:  Patient without complaints today, awaiting therapy services to start. Update 09/30/20:  Patient without complaint today, friend of family recently died.  Patient has upcoming appointment with Pharmacist and SW. Update 10/26/20:  Patient has appointment today for knee and ankle. Update 11/26/20:  Patient to get diabetic shoes 12/01/20   The patient verbalized understanding of instructions provided today and declined a print copy of patient instruction materials.   The Managed Medicaid care management team will reach out to the patient again over the next 30 days.  The  Patient has been provided with contact information for the Managed Medicaid care management team and has been advised to call with any health related questions or concerns.   [name] RN, [place]  [facility] Cur[facility] - Managed Medicaid High Risk [phone]  Following is a copy of your plan of care:  Patient Care Plan: General Plan of Care (Adult)     Problem Identified: Health Promotion or Disease Self-Management (General Plan of Care)   Priority: Medium  Onset Date: 04/23/2020     Problem Identified: Barriers to Treatment   Priority: High  Onset Date:  04/23/2020     Long-Range Goal: Barriers to Treatment Identified and Managed   Start Date: 05/03/2020  Expected End Date: 02/26/2021  Recent Progress: On track  Priority: Medium  Note:   CARE PLAN ENTRY Medicaid Managed Care (see longitudinal plan of care for additional care plan information)  Current Barriers:  Transportation barriers-patient utilizing [facility] and [employer] as needed. Limited access to caregiver-patient needs PCS, currently not receiving services she is expecting. Patient choosing not to use aides provided to her-discussed finding another agency with patient.  Nurse Clinical Goal(s):  Over the next 30 days, patient will work with [employer] to address needs related to transportation. Update 05/12/20:  Patient has used [employer] and is very pleased. Over the next 90 days, patient will attend all scheduled medical appointments. RNCM will make referral to SW for assistance with PCS Update 05/20/20:  Patient states she is now receiving PCS from [facility] 3 days a week for 4 hours each day. Update 05/27/20: BSW spoke with patient about PCS, she stated someone usually comes in 3 days a week for a few hours. She stated her aide was off today. BSW contacted [place] and spoke with [facility], she stated that patient has 13 hours per week with 6 days a week, however patient chooses to have an aide come out 3 days a week so that she can have more hours. The aide assist with most ADL's. Update 06/04/20: Patient stated she is ready to move to [facility]. She has already started the process but wants to save some money first and she has to wait on her nephew who will drive the uhaul truck. Update 06/24/20: BSW contacted patient to follow up on her moving to [facility], she stated she is still saving money. Patient did cut call short because she was sleeping, patient stated she was not in need of any resources at this  time. Update 08/27/20: Bsw received information from [facility] about patient needing some batteries for her remote. BSW contacted patient to see if any resources were needed. Patient stated she contacted [employer] and they informed her it would cost 40 dollars to replace her remote, patient states she only needs batteries. BSW asked if patient had anyone to call that could bring her some batteries, patient stated no. She states her daughter is mad at her right now and working so she does not talk to her, patient states she has not family to call. BSW asked if her aide could help, patient states she has not had an aide in about a week, she had to let her last aide go because she smelled like marijuana and they have not replaced her. BSW contacted patients healthcare agency [facility] at [phone] and left a voicemail. Update 09/30/20:  Patient has aides available to her at [facility] but chooses not to use them. Discussion with [facility] and they continue to look for another aide for Ms. Suguitan. Discussed with patient about her finding a new agency. 10/05/20: BSW received a telephone call from  [place] with patient on the line. Patient stated that she went to the ED and her eye was still hurting from a fall. Patient stated she has the information for her eye doctor and was getting ready to contact them. Patient stated she has not recieieved an aide since last Thursday.  Update 10/26/20:  Patient now has aide coming M-F 10-12, "Lucy" Update 11/26/20:  Patient with no aide right now-has chosen not to utilize aides offered to her.  Interventions: Inter-disciplinary care team collaboration (see longitudinal plan of care) Referred patient to community resources care guide team for assistance with transportation. Provided patient with information about [facility] and [employer]. Discussed plans with patient for ongoing care management follow up and provided patient with direct contact  information for care management team Advised patient to call [place] or [employer]. Collaborated with [employer] and [place] regarding transportation. Assisted patient/caregiver with obtaining information about health plan benefits Collaborated with Social Work regarding PCS. Update 08/30/20:  Patient continues to have issues with PCS-SW is investigating. Update 06/14/20:  Patient continues to utilize [employer], aide, [employer] as needed-has missed appointments due to transportation "no shows"-will continue to follow. Update 08/30/20:  patient has been successful in attending appointments with [facility] and [facility] transportation. Update 09/30/20:  Patient has upcoming appointments with [facility] Pharmacist and SW. Patient Self Care Activities:  Patient will call [facility]. Update 06/07/20:  Patient continues to utilize [facility] with success. Update 06/14/20:  Patient has missed appointments due to transportation no shows.  [facility] transportation has been utilized as needed. Update 09/30/20:  Patient with no transportation problems right now-uses [employer] or [place] as needed for appointments with success.    Patient Care Plan: General Plan of Care (Adult)     Problem Identified: Health Promotion or Disease Self-Management (General Plan of Care)   Priority: High  Onset Date: 04/23/2020     Long-Range Goal: Self-Management Plan Developed   Start Date: 04/23/2020  Expected End Date: 02/26/2021  Recent Progress: On track  Priority: Medium  Note:   Current Barriers:  Chronic Disease Management support and education needs.  Patient with abdominal wall infection-seen at [facility] 05/24/20 - I&D performed. Update 06/01/20:  Patient states she is improving-HHRN is cleaning and dressing infection. Update 06/07/20:  Infection continues to improve per patient, HHRN continues to clean and dress infection site. Update  06/17/20:  Infection improved, healed. Update 10/26/20:  Patient with recent fall injuring eye and ankle-patient states eye is healing and she is seeing provider today about ankle and knee. Update 11/26/20:  Patient without complaint today, getting Diabetic shoes 8/24.  Nurse Case Manager Clinical Goal(s):  Over the next 30 days, patient will attend all scheduled medical appointments:  Over the next 30 days, patient will work with CM team pharmacist to review medications. Update 05/20/20:  Patient has appointment with Pharmacist 05/21/20. Update 05/25/20:  Patient declines to speak with Pharmacist at this time. Update 08/30/20:  Patient is working with Pharmacist.  Interventions:  Inter-disciplinary care team collaboration (see longitudinal plan of care) Evaluation of current treatment plan and patient's adherence to plan as established by provider. Reviewed medications with patient. Collaborated with pharmacist regarding medications. Discussed plans with patient for ongoing care management follow up and provided patient with direct contact information for care management team Reviewed scheduled/upcoming provider appointments.  Update 05/20/20:  Patient unable to attend PT appointment 05/19/20, has next appointment 05/26/20.  Has Cardiology appointment 05/28/20.  Reviewed  [place] information with patient again and with aide. Update 06/07/20:  Patient has PT appointment 06/09/20. Update 06/17/20:  patient attended PT appointment 06/16/20 and also attended appointment at [facility] this week-patient states she feels better. Pharmacy referral for medication review. Update 05/24/20: Pharmacist has attempted to speak with patient and has been unable to.  Patient at Urgent Care today with stomach pain.  RNCM will reschedule appointment with patient and speak with her regarding importance of speaking with Pharmacist. Update 05/25/20: Patient declines to speak with Pharmacist at this time. Update  08/30/20:  Patient is working with Pharmacist for medication management. Update 09/30/20:  Patient without complaint today, has upcoming appointments with [facility] Pharmacist and SW.  Patient Goals/Self-Care Activities Over the next 30 days, patient will:  -Attends all scheduled provider appointments Calls pharmacy for medication refills Calls provider office for new concerns or questions  Follow Up Plan: The Managed Medicaid care management team will reach out to the patient again over the next 30 days.  The patient has been provided with contact information for the Managed Medicaid care management team and has been advised to call with any health related questions or concerns.

## 2020-11-26 NOTE — Progress Notes (Signed)
Michele Owens, Michele Owens ([id number]) Visit Report for 11/26/2020 Arrival Information Details Patient Name: Date of Service: Michele Owens, Michele Owens 11/26/2020 10:45 A M Medical Record Number: [id number] Patient Account Number: [id number] Date of Birth/Sex: Treating RN: 1960-09-10 (60 y.o. Nancy Fetter Primary Care Kiyah Demartini: Gala Romney Other Clinician: Referring Vincen Bejar: Treating Victoriano Campion/Extender: Lamont Snowball in Treatment: 4 Visit Information History Since Last Visit Added or deleted any medications: No Patient Arrived: Ambulatory Any new allergies or adverse reactions: No Arrival Time: 09:43 Had a fall or experienced change in No Accompanied By: alone activities of daily living that may affect Transfer Assistance: None risk of falls: Patient Identification Verified: Yes Signs or symptoms of abuse/neglect since last visito No Secondary Verification Process Completed: Yes Hospitalized since last visit: No Patient Requires Transmission-Based Precautions: No Implantable device outside of the clinic excluding No Patient Has Alerts: No cellular tissue based products placed in the center since last visit: Has Dressing in Place as Prescribed: Yes Has Compression in Place as Prescribed: Yes Pain Present Now: No Electronic Signature(s) Signed: 11/26/2020 12:21:50 PM By: Levan Hurst RN, BSN Entered By: Levan Hurst on 11/26/2020 09:44:08 -------------------------------------------------------------------------------- Clinic Level of Care Assessment Details Patient Name: Date of Service: Michele Owens, Michele Owens 11/26/2020 10:45 A M Medical Record Number: [id number] Patient Account Number: [id number] Date of Birth/Sex: Treating RN: 1960-12-15 (60 y.o. Nancy Fetter Primary Care Gerrit Rafalski: Gala Romney Other Clinician: Referring Hadessah Grennan: Treating Adayah Arocho/Extender: Lamont Snowball in Treatment: 4 Clinic Level of Care Assessment Items TOOL  4 Quantity Score X- 1 0 Use when only an EandM is performed on FOLLOW-UP visit ASSESSMENTS - Nursing Assessment / Reassessment X- 1 10 Reassessment of Co-morbidities (includes updates in patient status) X- 1 5 Reassessment of Adherence to Treatment Plan ASSESSMENTS - Wound and Skin A ssessment / Reassessment X - Simple Wound Assessment / Reassessment - one wound 1 5 '[]'$  - 0 Complex Wound Assessment / Reassessment - multiple wounds '[]'$  - 0 Dermatologic / Skin Assessment (not related to wound area) ASSESSMENTS - Focused Assessment '[]'$  - 0 Circumferential Edema Measurements - multi extremities '[]'$  - 0 Nutritional Assessment / Counseling / Intervention X- 1 5 Lower Extremity Assessment (monofilament, tuning fork, pulses) '[]'$  - 0 Peripheral Arterial Disease Assessment (using hand held doppler) ASSESSMENTS - Ostomy and/or Continence Assessment and Care '[]'$  - 0 Incontinence Assessment and Management '[]'$  - 0 Ostomy Care Assessment and Management (repouching, etc.) PROCESS - Coordination of Care X - Simple Patient / Family Education for ongoing care 1 15 '[]'$  - 0 Complex (extensive) Patient / Family Education for ongoing care X- 1 10 Staff obtains Programmer, systems, Records, T Results / Process Orders est '[]'$  - 0 Staff telephones HHA, Nursing Homes / Clarify orders / etc '[]'$  - 0 Routine Transfer to another Facility (non-emergent condition) '[]'$  - 0 Routine Hospital Admission (non-emergent condition) '[]'$  - 0 New Admissions / Biomedical engineer / Ordering NPWT Apligraf, etc. , '[]'$  - 0 Emergency Hospital Admission (emergent condition) X- 1 10 Simple Discharge Coordination '[]'$  - 0 Complex (extensive) Discharge Coordination PROCESS - Special Needs '[]'$  - 0 Pediatric / Minor Patient Management '[]'$  - 0 Isolation Patient Management '[]'$  - 0 Hearing / Language / Visual special needs '[]'$  - 0 Assessment of Community assistance (transportation, D/C planning, etc.) '[]'$  - 0 Additional assistance / Altered  mentation '[]'$  - 0 Support Surface(s) Assessment (bed, cushion, seat, etc.) INTERVENTIONS - Wound Cleansing / Measurement X - Simple Wound Cleansing - one wound 1 5 '[]'$  - 0  Complex Wound Cleansing - multiple wounds X- 1 5 Wound Imaging (photographs - any number of wounds) '[]'$  - 0 Wound Tracing (instead of photographs) X- 1 5 Simple Wound Measurement - one wound '[]'$  - 0 Complex Wound Measurement - multiple wounds INTERVENTIONS - Wound Dressings '[]'$  - 0 Small Wound Dressing one or multiple wounds '[]'$  - 0 Medium Wound Dressing one or multiple wounds '[]'$  - 0 Large Wound Dressing one or multiple wounds '[]'$  - 0 Application of Medications - topical '[]'$  - 0 Application of Medications - injection INTERVENTIONS - Miscellaneous '[]'$  - 0 External ear exam '[]'$  - 0 Specimen Collection (cultures, biopsies, blood, body fluids, etc.) '[]'$  - 0 Specimen(s) / Culture(s) sent or taken to Lab for analysis '[]'$  - 0 Patient Transfer (multiple staff / Civil Service fast streamer / Similar devices) '[]'$  - 0 Simple Staple / Suture removal (25 or less) '[]'$  - 0 Complex Staple / Suture removal (26 or more) '[]'$  - 0 Hypo / Hyperglycemic Management (close monitor of Blood Glucose) '[]'$  - 0 Ankle / Brachial Index (ABI) - do not check if billed separately X- 1 5 Vital Signs Has the patient been seen at the hospital within the last three years: Yes Total Score: 80 Level Of Care: New/Established - Level 3 Electronic Signature(s) Signed: 11/26/2020 12:21:50 PM By: Levan Hurst RN, BSN Entered By: Levan Hurst on 11/26/2020 10:53:29 -------------------------------------------------------------------------------- Encounter Discharge Information Details Patient Name: Date of Service: Michele Owens 11/26/2020 10:45 A M Medical Record Number: [id number] Patient Account Number: [id number] Date of Birth/Sex: Treating RN: Dec 04, 1960 (60 y.o. Nancy Fetter Primary Care Isabell Bonafede: Gala Romney Other Clinician: Referring  Arris Meyn: Treating Amandalynn Pitz/Extender: Lamont Snowball in Treatment: 4 Encounter Discharge Information Items Discharge Condition: Stable Ambulatory Status: Wheelchair Discharge Destination: Home Transportation: Private Auto Accompanied By: alone Schedule Follow-up Appointment: Yes Clinical Summary of Care: Patient Declined Electronic Signature(s) Signed: 11/26/2020 12:21:50 PM By: Levan Hurst RN, BSN Entered By: Levan Hurst on 11/26/2020 10:54:21 -------------------------------------------------------------------------------- Lower Extremity Assessment Details Patient Name: Date of Service: Michele Owens, Michele Owens 11/26/2020 10:45 A M Medical Record Number: [id number] Patient Account Number: [id number] Date of Birth/Sex: Treating RN: 1960/09/01 (60 y.o. Nancy Fetter Primary Care Kipp Shank: Gala Romney Other Clinician: Referring Shalea Tomczak: Treating Taber Sweetser/Extender: Lambert Keto Weeks in Treatment: 4 Edema Assessment Assessed: [Left: No] [Right: No] Edema: [Left: Ye] [Right: s] Calf Left: Right: Point of Measurement: 27 cm From Medial Instep 40 cm Ankle Left: Right: Point of Measurement: 8 cm From Medial Instep 23 cm Vascular Assessment Pulses: Dorsalis Pedis Palpable: [Right:Yes] Electronic Signature(s) Signed: 11/26/2020 12:21:50 PM By: Levan Hurst RN, BSN Entered By: Levan Hurst on 11/26/2020 09:50:26 -------------------------------------------------------------------------------- Multi Wound Chart Details Patient Name: Date of Service: Michele Owens 11/26/2020 10:45 A M Medical Record Number: [id number] Patient Account Number: [id number] Date of Birth/Sex: Treating RN: [birth year] (60 y.o. F) Primary Care Alaiya Martindelcampo: Gala Romney Other Clinician: Referring Rachael Zapanta: Treating Tashe Purdon/Extender: Lambert Keto Weeks in Treatment: 4 Vital Signs Height(in): 65 Capillary Blood Glucose(mg/dl):  139 Weight(lbs): 306 Pulse(bpm): 99 Body Mass Index(BMI): 43 Blood Pressure(mmHg): 144/76 Temperature(F): 98.8 Respiratory Rate(breaths/min): 20 Photos: [N/A:N/A] Right, Medial Foot N/A N/A Wound Location: Trauma N/A N/A Wounding Event: Diabetic Wound/Ulcer of the Lower N/A N/A Primary Etiology: Extremity Asthma, Chronic Obstructive N/A N/A Comorbid History: Pulmonary Disease (COPD), Congestive Heart Failure, Deep Vein Thrombosis, Hypertension, Type II Diabetes, Gout, Osteoarthritis 10/22/2020 N/A N/A Date Acquired: 4 N/A N/A Weeks of Treatment: Healed - Epithelialized N/A N/A Wound Status: 0x0x0 N/A  N/A Measurements L x W x D (cm) 0 N/A N/A A (cm) : rea 0 N/A N/A Volume (cm) : 100.00% N/A N/A % Reduction in A rea: 100.00% N/A N/A % Reduction in Volume: Unable to visualize wound bed N/A N/A Classification: None Present N/A N/A Exudate A mount: Distinct, outline attached N/A N/A Wound Margin: None Present (0%) N/A N/A Granulation A mount: None Present (0%) N/A N/A Necrotic A mount: Fascia: No N/A N/A Exposed Structures: Fat Layer (Subcutaneous Tissue): No Tendon: No Muscle: No Joint: No Bone: No Large (67-100%) N/A N/A Epithelialization: Treatment Notes Electronic Signature(s) Signed: 11/26/2020 11:17:17 AM By: Kalman Shan DO Entered By: Kalman Shan on 11/26/2020 11:13:23 -------------------------------------------------------------------------------- Multi-Disciplinary Care Plan Details Patient Name: Date of Service: Michele Owens 11/26/2020 10:45 A M Medical Record Number: [id number] Patient Account Number: [id number] Date of Birth/Sex: Treating RN: [birth year] (60 y.o. Nancy Fetter Primary Care Christerpher Clos: Gala Romney Other Clinician: Referring Naina Sleeper: Treating Lucee Brissett/Extender: Lamont Snowball in Treatment: Coleman reviewed with physician Active Inactive Electronic  Signature(s) Signed: 11/26/2020 12:21:50 PM By: Levan Hurst RN, BSN Entered By: Levan Hurst on 11/26/2020 10:52:56 -------------------------------------------------------------------------------- Pain Assessment Details Patient Name: Date of Service: Michele Owens, Michele Owens 11/26/2020 10:45 A M Medical Record Number: [id number] Patient Account Number: [id number] Date of Birth/Sex: Treating RN: 24-Oct-1960 (60 y.o. Nancy Fetter Primary Care Veola Cafaro: Gala Romney Other Clinician: Referring Leoni Goodness: Treating Lakelyn Straus/Extender: Lambert Keto Weeks in Treatment: 4 Active Problems Location of Pain Severity and Description of Pain Patient Has Paino No Site Locations Pain Management and Medication Current Pain Management: Electronic Signature(s) Signed: 11/26/2020 12:21:50 PM By: Levan Hurst RN, BSN Entered By: Levan Hurst on 11/26/2020 09:44:42 -------------------------------------------------------------------------------- Patient/Caregiver Education Details Patient Name: Date of Service: Michele Owens 8/19/2022andnbsp10:45 [place] Record Number: [id number] Patient Account Number: [id number] Date of Birth/Gender: Treating RN: 17-Nov-1960 (60 y.o. Nancy Fetter Primary Care Physician: Gala Romney Other Clinician: Referring Physician: Treating Physician/Extender: Lamont Snowball in Treatment: 4 Education Assessment Education Provided To: Patient Education Topics Provided Wound/Skin Impairment: Methods: Explain/Verbal Responses: State content correctly Electronic Signature(s) Signed: 11/26/2020 12:21:50 PM By: Levan Hurst RN, BSN Entered By: Levan Hurst on 11/26/2020 10:53:07 -------------------------------------------------------------------------------- Wound Assessment Details Patient Name: Date of Service: Michele Owens, Michele Owens 11/26/2020 10:45 A M Medical Record Number: [id number] Patient Account Number:  [id number] Date of Birth/Sex: Treating RN: 12/18/1960 (60 y.o. Nancy Fetter Primary Care Forest Redwine: Gala Romney Other Clinician: Referring Jun Osment: Treating Emmaleah Meroney/Extender: Lambert Keto Weeks in Treatment: 4 Wound Status Wound Number: 1 Primary Diabetic Wound/Ulcer of the Lower Extremity Etiology: Wound Location: Right, Medial Foot Wound Healed - Epithelialized Wounding Event: Trauma Status: Date Acquired: 10/22/2020 Comorbid Asthma, Chronic Obstructive Pulmonary Disease (COPD), Weeks Of Treatment: 4 History: Congestive Heart Failure, Deep Vein Thrombosis, Hypertension, Clustered Wound: No Type II Diabetes, Gout, Osteoarthritis Photos Wound Measurements Length: (cm) Width: (cm) Depth: (cm) Area: (cm) Volume: (cm) 0 % Reduction in Area: 100% 0 % Reduction in Volume: 100% 0 Epithelialization: Large (67-100%) 0 Tunneling: No 0 Undermining: No Wound Description Classification: Unable to visualize wound bed Wound Margin: Distinct, outline attached Exudate Amount: None Present Foul Odor After Cleansing: No Slough/Fibrino No Wound Bed Granulation Amount: None Present (0%) Exposed Structure Necrotic Amount: None Present (0%) Fascia Exposed: No Fat Layer (Subcutaneous Tissue) Exposed: No Tendon Exposed: No Muscle Exposed: No Joint Exposed: No Bone Exposed: No Electronic Signature(s) Signed: 11/26/2020 12:21:50 PM By: Levan Hurst RN, BSN Entered By:  Levan Hurst on 11/26/2020 10:52:01 -------------------------------------------------------------------------------- Vitals Details Patient Name: Date of Service: Michele Owens, Michele Owens 11/26/2020 10:45 A M Medical Record Number: [id number] Patient Account Number: [id number] Date of Birth/Sex: Treating RN: [birth year] (60 y.o. Nancy Fetter Primary Care Riggin Cuttino: Gala Romney Other Clinician: Referring Lennyn Gange: Treating Babatunde Seago/Extender: Lambert Keto Weeks in  Treatment: 4 Vital Signs Time Taken: 09:44 Temperature (F): 98.8 Height (in): 65 Pulse (bpm): 99 Weight (lbs): 306 Respiratory Rate (breaths/min): 20 Body Mass Index (BMI): 50.9 Blood Pressure (mmHg): 144/76 Capillary Blood Glucose (mg/dl): 139 Reference Range: 80 - 120 mg / dl Notes glucose per pt report Electronic Signature(s) Signed: 11/26/2020 12:21:50 PM By: Levan Hurst RN, BSN Entered By: Levan Hurst on 11/26/2020 09:44:33

## 2020-11-26 NOTE — Patient Outreach (Signed)
Medicaid Managed Care   Nurse Care Manager Note  11/26/2020 Name:  Solstice Lastinger MRN:  [id number] DOB:  1960-09-26  Deshanae Lindo is an 60 y.o. year old female who is a primary patient of [name], MD.  The [facility] Managed Care Coordination team was consulted for assistance with:    Chronic healthcare management needs.  Ms. Credeur was given information about Medicaid Managed Care Coordination team services today. [name] Patient agreed to services and verbal consent obtained.  Engaged with patient by telephone for follow up visit in response to provider referral for case management and/or care coordination services.   Assessments/Interventions:  Review of past medical history, allergies, medications, health status, including review of consultants reports, laboratory and other test data, was performed as part of comprehensive evaluation and provision of chronic care management services.  SDOH (Social Determinants of Health) assessments and interventions performed: SDOH Interventions    Flowsheet Row Most Recent Value  SDOH Interventions   Food Insecurity Interventions Intervention Not Indicated  Financial Strain Interventions Intervention Not Indicated  Housing Interventions Intervention Not Indicated  Intimate Partner Violence Interventions Intervention Not Indicated  Physical Activity Interventions Other (Comments)  [patient not physically able to do this]  Stress Interventions Patient Refused  Social Connections Interventions Intervention Not Indicated       Care Plan  Allergies  Allergen Reactions   Bee Venom Swelling and Other (See Comments)    "All over my body" (swelling)   Fioricet [Butalbital-Apap-Caffeine] Nausea And Vomiting and Rash   Ibuprofen Rash and Other (See Comments)    Severe rash   Lamisil [Terbinafine] Rash and Other (See Comments)    Pt states this causes her to "feel funny"   Nsaids Other (See Comments)    Per MD's orders      Medications Reviewed Today     Reviewed by [name], RN (Registered Nurse) on 11/26/20 at [phone]  Med List Status: <None>   Medication Order Taking? Sig Documenting Provider Last Dose Status Informant  albuterol (PROVENTIL HFA;VENTOLIN HFA) 108 (90 Base) MCG/ACT inhaler [id number] No Inhale 1-2 puffs into the lungs every 6 (six) hours as needed for wheezing or shortness of breath. Margette Fast, MD 08/13/2020 Unknown time Active Self           Med Note Rosine Beat Oct 04, 2020  8:43 PM) [name] 05/12/2020 [facility] per external pharmacy records   allopurinol (ZYLOPRIM) 100 MG tablet [id number] No Take 100 mg by mouth daily. [provider] 08/13/2020 Unknown time Active Self           Med Note Orvan Seen, HEATHER L   Mon Oct 04, 2020  8:30 PM) #30 filled 09/10/2020 [facility] per external pharmacy records   benzonatate (TESSALON) 200 MG capsule [id number] No Take 1 capsule (200 mg total) by mouth in the morning, at noon, and at bedtime. 7 DS Elmarie Shiley, MD 08/13/2020 Unknown time Active Self           Med Note Orvan Seen, HEATHER L   Mon Oct 04, 2020  8:30 PM) #21 filled 09/27/2020 [facility] per external pharmacy records  Blood Glucose Monitoring Suppl (ACCU-CHEK AVIVA PLUS) w/Device KIT [id number] No 1 each by Other route daily.  [provider] unk unk Active Self  butalbital-acetaminophen-caffeine (FIORICET) 50-325-40 MG tablet [id number] No Take 1 tablet by mouth 2 (two) times daily as needed for migraine. [provider] Past Month Unknown time Active Self  Med Note Rosine Beat Oct 04, 2020  8:31 PM) #60/30 days filled 09/10/2020 [facility] per external pharmacy records   calcitRIOL (ROCALTROL) 0.5 MCG capsule [id number] No Take 1 capsule (0.5 mcg total) by mouth daily. Modena Jansky, MD 08/13/2020 Unknown time Active Self           Med Note Rosine Beat Oct 04, 2020  8:31 PM) #30 filled 09/10/2020 [facility] per external pharmacy records   carvedilol (COREG) 6.25 MG tablet [id number] No Take 1 tablet (6.25 mg total) by mouth 2 (two) times daily with a meal. Bensimhon, [name], MD 08/13/2020 3 am Active Self           Med Note Payton Doughty   Mon Oct 04, 2020  8:31 PM) #60 filled 09/10/2020 [facility] per external pharmacy records   clonazePAM (KLONOPIN) 2 MG tablet [id number] No Take 1 tablet (2 mg total) by mouth 3 (three) times daily. for anxiety  Patient taking differently: Take 2 mg by mouth 3 (three) times daily.   [name], MD 08/13/2020 Unknown time Active            Med Note Rosine Beat Oct 04, 2020  8:25 PM) #90/30 days filled 09/07/2020 [facility] per PMP AWARE  cyclobenzaprine (FLEXERIL) 10 MG tablet [id number] No Take 10 mg by mouth 3 (three) times daily. [provider] 08/13/2020 Unknown time Active Self           Med Note Orvan Seen, HEATHER L   Mon Oct 04, 2020  8:32 PM) #90/30 days filled 09/10/2020 [facility] per external pharmacy records   diclofenac sodium (VOLTAREN) 1 % GEL [id number] No Apply 2 g topically 4 (four) times daily as needed (for pain).  Patient not taking: No sig reported   [name], MD Not Taking Unknown time Active Self  EPINEPHrine 0.3 mg/0.3 mL IJ SOAJ injection [id number] No Inject 0.3 mg into the muscle once as needed for anaphylaxis. [provider] unk last dose Active Self           Med Note Rosine Beat Oct 04, 2020  8:41 PM) LF 07/20/2020 [facility] per external pharmacy records   FEROSUL 325 (65 Fe) MG tablet [id number] No Take 325 mg by mouth daily. [provider] 08/13/2020 Unknown time Active Self           Med Note Rosine Beat Oct 04, 2020  8:32 PM) #30 filled 09/10/2020 [facility] per external pharmacy records   glucose blood test strip [id number] No Use as instructed to check blood sugar up to 4 times daily  Patient taking differently: 1 each by  Other route See admin instructions. Use as instructed to check blood sugar up to 4 times daily   [name], DO unk unk Active Self  guaiFENesin (MUCINEX) 600 MG 12 hr tablet [id number] No Take 2 tablets (1,200 mg total) by mouth 2 (two) times daily. Regalado, Belkys A, MD 08/13/2020 Unknown time Active Self  ipratropium-albuterol (DUONEB) 0.5-2.5 (3) MG/3ML SOLN [id number] No Take 3 mLs by nebulization 4 (four) times daily.  [provider] 08/12/2020 Unknown time Active Self           Med Note Rosine Beat Oct 04, 2020  8:43 PM) LF 05/17/2020 [facility] per external pharmacy records   levothyroxine (SYNTHROID, LEVOTHROID) 112 MCG tablet  [id number] No Take 224 mcg by mouth daily before breakfast. [provider] 08/13/2020 Unknown time Active Self           Med Note Orvan Seen, HEATHER L   Mon Oct 04, 2020  8:33 PM) #60/30 days filled 09/10/2020 [facility] per external pharmacy records   lisinopril (PRINIVIL,ZESTRIL) 5 MG tablet [id number] No Take 1 tablet (5 mg total) by mouth daily. Please call for office visit [phone]  Patient taking differently: Take 5 mg by mouth daily.   [name], MD 08/13/2020 Unknown time Active            Med Note Rosine Beat Oct 04, 2020  8:33 PM) #30 filled 09/10/2020 [facility] per external pharmacy records   loperamide (IMODIUM) 2 MG capsule [id number] No Take 1 capsule (2 mg total) by mouth 4 (four) times daily as needed for diarrhea or loose stools. [name], MD Past Month Unknown time Active Self  metFORMIN (GLUCOPHAGE) 500 MG tablet [id number] No Take 1 tablet (500 mg total) by mouth 2 (two) times daily with a meal. [name], MD 08/13/2020 Unknown time Active Self           Med Note Payton Doughty   Mon Oct 04, 2020  8:34 PM) #60 filled 09/10/2020 [facility] per external pharmacy records   nitroGLYCERIN (NITROSTAT) 0.4 MG SL tablet [id number] No Place 1 tablet (0.4 mg total) under the  tongue every 5 (five) minutes as needed for chest pain. [name], MD unk last dose Active Self           Med Note Rosine Beat Oct 04, 2020  8:43 PM) LF 04/28/2020 [facility] per external pharmacy records   nystatin cream (MYCOSTATIN) [id number] No Apply 1 application topically 2 (two) times daily as needed for dry skin. Elmarie Shiley, MD Past Week Unknown time Active Self           Med Note Rosine Beat Oct 04, 2020  8:42 PM) LF 07/19/2020 [facility] per external pharmacy records   nystatin ointment (MYCOSTATIN) [id number]  Apply 1 application topically 2 (two) times daily. [provider]  Active            Med Note Rosine Beat Oct 04, 2020  8:35 PM) LF 09/10/2020 [facility] per external pharmacy records   omeprazole (PRILOSEC) 20 MG capsule [id number] No Take 20 mg by mouth 2 (two) times daily before a meal. [provider] 08/13/2020 Unknown time Active Self           Med Note Orvan Seen, HEATHER L   Mon Oct 04, 2020  8:35 PM) #60 filled 09/10/2020 [facility] per external pharmacy records   oxyCODONE-acetaminophen (PERCOCET) 10-325 MG tablet [id number] No Take 1 tablet by mouth every 6 (six) hours as needed for pain. [provider] 08/13/2020 Unknown time Active Self           Med Note Orvan Seen, HEATHER L   Mon Oct 04, 2020  8:25 PM) #120/30 days filled 09/10/2020 [facility] per PMP AWARE  OXYGEN [id number] No Inhale 2-3 L into the lungs continuous. [provider] 08/13/2020 Unknown time Active Self           Med Note Corky Mull   Mon Apr 12, 2020 10:08 AM)    oxymetazoline [facility] NASAL SPRAY) 0.05 % nasal spray [id number]  Place 1 spray  into both nostrils 2 (two) times daily. Pearson Grippe, DO  Active   polyethylene glycol (MIRALAX / GLYCOLAX) 17 g packet [id number] No Take 17 g by mouth daily as needed for mild constipation. [provider] Past Week Unknown time Active Self            Med Note Orvan Seen, Sharlette Dense   Mon Oct 04, 2020  8:30 PM) 238 g filled 09/21/2020 [facility] per external pharmacy records   potassium chloride SA (K-DUR,KLOR-CON) 20 MEQ tablet [id number] No Take 1 tablet (20 mEq total) by mouth 2 (two) times daily. [name], MD 08/13/2020 Unknown time Active Self           Med Note Rosine Beat Oct 04, 2020  8:36 PM) #60 filled 09/10/2020 [facility] per external pharmacy records   predniSONE (DELTASONE) 20 MG tablet [id number]  Take 2 tablets (40 mg total) by mouth daily. Horton, Barbette Hair, MD  Active   rosuvastatin (CRESTOR) 10 MG tablet [id number] No Take 10 mg by mouth every evening. [provider] 08/13/2020 Unknown time Active Self           Med Note Rosine Beat Oct 04, 2020  8:36 PM) #30 filled 09/10/2020 [facility] per external pharmacy records   senna-docusate (SENNA-PLUS) 8.6-50 MG tablet [id number]  Take 1 tablet by mouth daily. [provider]  Active            Med Note Rosine Beat Oct 04, 2020  8:37 PM) #30 filled 09/10/2020 [facility] per external pharmacy records   torsemide (DEMADEX) 20 MG tablet [id number]  TAKE 4 (FOUR) TABLETS BY MOUTH DAILY (2AM+ 2NOON) [name], MD  Active   traZODone (DESYREL) 50 MG tablet [id number] No Take 100 mg by mouth at bedtime. [provider] 08/12/2020 Unknown time Active Self           Med Note Rosine Beat Oct 04, 2020  8:39 PM) #60 filled 09/10/2020 [facility] per external pharmacy records   triamcinolone cream (KENALOG) 0.1 % [id number]  Apply 1 application topically 2 (two) times daily. [provider]  Active            Med Note Rosine Beat Oct 04, 2020  8:40 PM) LF 09/10/2020 [facility] per external pharmacy records   umeclidinium-vilanterol [facility] ELLIPTA) 62.5-25 MCG/INH AEPB [id number]  Inhale 1 puff into the lungs daily. [provider]  Active            Med Note  Rosine Beat Oct 04, 2020  8:41 PM) 30DS filled 09/10/2020 [facility] per external pharmacy records             Patient Active Problem List   Diagnosis Date Noted   Acute on chronic respiratory failure with hypoxia and hypercapnia ([place]) 04/18/2020   Acute on chronic respiratory failure with hypoxia ([place]) 04/18/2020   Hemoptysis 14/97/0263   Acute metabolic encephalopathy 78/58/8502   Acute encephalopathy 04/18/2020   Chronic diastolic CHF (congestive heart failure) ([place]) 04/18/2020   Sepsis ([place]) 04/18/2020   Morbid obesity with BMI of 50.0-59.9, adult ([place])    Fall at home, initial encounter 06/23/2018   HNP (herniated nucleus pulposus), thoracic 06/23/2018   CHF (congestive heart failure) ([place]) 06/18/2018   Hypertensive disorder 08/28/2017   Obesity 08/28/2017   COPD exacerbation ([place]) 08/04/2017   Acute  and chronic respiratory failure with hypercapnia ([place])    Pulmonary HTN ([place])    Genetic testing 02/27/2017   Family history of thyroid cancer    Hypomagnesemia 10/03/2016   Atypical chest pain    HLD (hyperlipidemia) 09/23/2016   Gout 09/23/2016   DVT (deep vein thrombosis) in pregnancy 09/23/2016   Thyroid cancer ([place]) 09/23/2016   Hypocalcemia 09/23/2016   AKI (acute kidney injury) ([place]) 09/23/2016   Acute pulmonary edema ([place])    Acute hypoxemic respiratory failure (H[facility])    Ventilator dependent ([place])    Chronic respiratory failure with hypoxia and hypercapnia ([place]) 07/16/2016   CAP (community acquired pneumonia) 01/23/2016   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 01/23/2016   Pulmonary hypertension ([place]) 01/23/2016   Obesity hypoventilation syndrome ([place]) 08/26/2015   Dyslipidemia associated with type 2 diabetes mellitus ([place]) 04/24/2015   Leukocytosis 04/24/2015   Controlled type 2 diabetes mellitus without complication, without long-term current use of insulin ([place]) 04/24/2015   Anxiety 04/24/2015   Benign essential HTN 04/24/2015    Normocytic anemia 04/24/2015   OSA (obstructive sleep apnea) 05/10/2012   Tobacco abuse 07/04/2011    Conditions to be addressed/monitored per PCP order:   chronic healthcare management needs, OSA, DM2, HTN, anxiety,  HLD.  Care Plan : General Plan of Care (Adult)  Updates made by [name], RN since 11/26/2020 12:00 AM     Problem: Barriers to Treatment   Priority: High  Onset Date: 04/23/2020     Long-Range Goal: Barriers to Treatment Identified and Managed   Start Date: 05/03/2020  Expected End Date: 02/26/2021  Recent Progress: On track  Priority: Medium  Note:   CARE PLAN ENTRY Medicaid Managed Care (see longitudinal plan of care for additional care plan information)  Current Barriers:  Transportation barriers-patient utilizing [facility] and [employer] as needed. Limited access to caregiver-patient needs PCS, currently not receiving services she is expecting. Patient choosing not to use aides provided to her-discussed finding another agency with patient.  Nurse Clinical Goal(s):  Over the next 30 days, patient will work with [employer] to address needs related to transportation. Update 05/12/20:  Patient has used [employer] and is very pleased. Over the next 90 days, patient will attend all scheduled medical appointments. RNCM will make referral to SW for assistance with PCS Update 05/20/20:  Patient states she is now receiving PCS from [facility] 3 days a week for 4 hours each day. Update 05/27/20: BSW spoke with patient about PCS, she stated someone usually comes in 3 days a week for a few hours. She stated her aide was off today. BSW contacted [place] and spoke with [facility], she stated that patient has 13 hours per week with 6 days a week, however patient chooses to have an aide come out 3 days a week so that she can have more hours. The aide assist with most ADL's. Update 06/04/20: Patient stated she  is ready to move to [facility]. She has already started the process but wants to save some money first and she has to wait on her nephew who will drive the uhaul truck. Update 06/24/20: BSW contacted patient to follow up on her moving to [facility], she stated she is still saving money. Patient did cut call short because she was sleeping, patient stated she was not in need of any resources at this time. Update 08/27/20: Bsw received information from [facility] about patient needing some batteries for  her remote. BSW contacted patient to see if any resources were needed. Patient stated she contacted [employer] and they informed her it would cost 40 dollars to replace her remote, patient states she only needs batteries. BSW asked if patient had anyone to call that could bring her some batteries, patient stated no. She states her daughter is mad at her right now and working so she does not talk to her, patient states she has not family to call. BSW asked if her aide could help, patient states she has not had an aide in about a week, she had to let her last aide go because she smelled like marijuana and they have not replaced her. BSW contacted patients healthcare agency [facility] at [phone] and left a voicemail. Update 09/30/20:  Patient has aides available to her at [facility] but chooses not to use them. Discussion with [facility] and they continue to look for another aide for Ms. Speirs. Discussed with patient about her finding a new agency. 10/05/20: BSW received a telephone call from [facility] with patient on the line. Patient stated that she went to the ED and her eye was still hurting from a fall. Patient stated she has the information for her eye doctor and was getting ready to contact them. Patient stated she has not recieieved an aide since last Thursday.  Update 10/26/20:  Patient now has aide coming M-F 10-12, "Lucy" Update 11/26/20:  Patient with no aide right now-has chosen not to  utilize aides offered to her.  Interventions: Inter-disciplinary care team collaboration (see longitudinal plan of care) Referred patient to community resources care guide team for assistance with transportation. Provided patient with information about [facility] and [employer]. Discussed plans with patient for ongoing care management follow up and provided patient with direct contact information for care management team Advised patient to call [place] or [employer]. Collaborated with [employer] and [place] regarding transportation. Assisted patient/caregiver with obtaining information about health plan benefits Collaborated with Social Work regarding PCS. Update 08/30/20:  Patient continues to have issues with PCS-SW is investigating. Update 06/14/20:  Patient continues to utilize [employer], aide, [employer] as needed-has missed appointments due to transportation "no shows"-will continue to follow. Update 08/30/20:  patient has been successful in attending appointments with [facility] and [facility] transportation. Update 09/30/20:  Patient has upcoming appointments with [facility] Pharmacist and SW. Patient Self Care Activities:  Patient will call [place]. Update 06/07/20:  Patient continues to utilize [facility] with success. Update 06/14/20:  Patient has missed appointments due to transportation no shows.  [facility] transportation has been utilized as needed. Update 09/30/20:  Patient with no transportation problems right now-uses [employer] or [place] as needed for appointments with success.   Care Plan : General Plan of Care (Adult)  Updates made by [name], RN since 11/26/2020 12:00 AM     Problem: Health Promotion or Disease Self-Management (General Plan of Care)   Priority: High  Onset Date: 04/23/2020     Long-Range Goal: Self-Management Plan Developed   Start Date: 04/23/2020   Expected End Date: 02/26/2021  Recent Progress: On track  Priority: Medium  Note:   Current Barriers:  Chronic Disease Management support and education needs.  Patient with abdominal wall infection-seen at [facility] ED 05/24/20 - I&D performed. Update 06/01/20:  Patient states she is improving-HHRN is cleaning and dressing infection. Update 06/07/20:  Infection continues to improve per patient, HHRN continues to clean  and dress infection site. Update 06/17/20:  Infection improved, healed. Update 10/26/20:  Patient with recent fall injuring eye and ankle-patient states eye is healing and she is seeing provider today about ankle and knee. Update 11/26/20:  Patient without complaint today, getting Diabetic shoes 8/24.  Nurse Case Manager Clinical Goal(s):  Over the next 30 days, patient will attend all scheduled medical appointments:  Over the next 30 days, patient will work with CM team pharmacist to review medications. Update 05/20/20:  Patient has appointment with Pharmacist 05/21/20. Update 05/25/20:  Patient declines to speak with Pharmacist at this time. Update 08/30/20:  Patient is working with Pharmacist.  Interventions:  Inter-disciplinary care team collaboration (see longitudinal plan of care) Evaluation of current treatment plan and patient's adherence to plan as established by provider. Reviewed medications with patient. Collaborated with pharmacist regarding medications. Discussed plans with patient for ongoing care management follow up and provided patient with direct contact information for care management team Reviewed scheduled/upcoming provider appointments.  Update 05/20/20:  Patient unable to attend PT appointment 05/19/20, has next appointment 05/26/20.  Has Cardiology appointment 05/28/20.  Reviewed [place] information with patient again and with aide. Update 06/07/20:  Patient has PT appointment 06/09/20. Update 06/17/20:  patient attended PT appointment 06/16/20 and also  attended appointment at [facility] this week-patient states she feels better. Pharmacy referral for medication review. Update 05/24/20: Pharmacist has attempted to speak with patient and has been unable to.  Patient at Urgent Care today with stomach pain.  RNCM will reschedule appointment with patient and speak with her regarding importance of speaking with Pharmacist. Update 05/25/20: Patient declines to speak with Pharmacist at this time. Update 08/30/20:  Patient is working with Pharmacist for medication management. Update 09/30/20:  Patient without complaint today, has upcoming appointments with [facility] Pharmacist and SW.  Patient Goals/Self-Care Activities Over the next 30 days, patient will:  -Attends all scheduled provider appointments Calls pharmacy for medication refills Calls provider office for new concerns or questions  Follow Up Plan: The Managed Medicaid care management team will reach out to the patient again over the next 30 days.  The patient has been provided with contact information for the Managed Medicaid care management team and has been advised to call with any health related questions or concerns.    Follow Up:  Patient agrees to Care Plan and Follow-up.  Plan: The Managed Medicaid care management team will reach out to the patient again over the next 30 days. and The patient has been provided with contact information for the Managed Medicaid care management team and has been advised to call with any health related questions or concerns.  Date/time of next scheduled RN care management/care coordination outreach:  12/27/20 at 0900.

## 2020-11-26 NOTE — Progress Notes (Signed)
Michele Owens ([id number]) Visit Report for 11/26/2020 Chief Complaint Document Details Patient Name: Date of Service: Michele Owens, Michele Owens 11/26/2020 10:45 A M Medical Record Number: [id number] Patient Account Number: [id number] Date of Birth/Sex: Treating RN: 06/25/1960 (60 y.o. F) Primary Care Provider: Gala Romney Other Clinician: Referring Provider: Treating Provider/Extender: Michele Owens in Treatment: 4 Information Obtained from: Patient Chief Complaint Right foot wound Electronic Signature(s) Signed: 11/26/2020 11:17:17 AM By: Kalman Shan DO Entered By: Kalman Shan on 11/26/2020 11:13:30 -------------------------------------------------------------------------------- HPI Details Patient Name: Date of Service: Michele Owens 11/26/2020 10:45 A M Medical Record Number: [id number] Patient Account Number: [id number] Date of Birth/Sex: Treating RN: 1960-10-01 (60 y.o. F) Primary Care Provider: Gala Romney Other Clinician: Referring Provider: Treating Provider/Extender: Michele Owens in Treatment: 4 History of Present Illness HPI Description: Admission 7/22 Michele Owens is a 60 year old female with a past medical history of controlled type 2 diabetes on oral agents, essential hypertension, COPD on chronic 4 L O2 via an [place] that presents the clinic for a 2 week history of nonhealing wound to the right medial foot. Patient states that she was out with her mother walking when she fell and landed on her foot landed on a rock. She required assistance from the pizza delivery man to get back up. She visited the ED for this issue. She had imaging of her right foot that showed no acute fracture or dislocation. She currently denies pain. She denies increased warmth erythema or drainage from the wound. 7/29; patient presents for 1 week follow-up. She has tolerated the compression wrap. She is still concerned that she is  having a lot of pain to her foot. She denies signs of infection. 8/19; patient presents for follow-up. She missed her last week appointment due to [place]. She has had the wrap on for 2 Owens. She denies any pain to the wound area. She denies signs of infection. Electronic Signature(s) Signed: 11/26/2020 11:17:17 AM By: Kalman Shan DO Entered By: Kalman Shan on 11/26/2020 11:14:18 -------------------------------------------------------------------------------- Physical Exam Details Patient Name: Date of Service: Michele Owens 11/26/2020 10:45 A M Medical Record Number: [id number] Patient Account Number: [id number] Date of Birth/Sex: Treating RN: [birth year] (60 y.o. F) Primary Care Provider: Gala Romney Other Clinician: Referring Provider: Treating Provider/Extender: Michele Owens in Treatment: 4 Constitutional respirations regular, non-labored and within target range for patient.. Cardiovascular 2+ dorsalis pedis/posterior tibialis pulses. Psychiatric pleasant and cooperative. Notes Right foot: T the medial aspect there is callus and a small scab over the previous wound site. No drainage noted. No fluctuance noted. No signs of infection. o No tenderness on exam. Electronic Signature(s) Signed: 11/26/2020 11:17:17 AM By: Kalman Shan DO Entered By: Kalman Shan on 11/26/2020 11:15:06 -------------------------------------------------------------------------------- Physician Orders Details Patient Name: Date of Service: Michele Owens, Michele Owens 11/26/2020 10:45 A M Medical Record Number: [id number] Patient Account Number: [id number] Date of Birth/Sex: Treating RN: 03-04-1961 (60 y.o. Michele Owens Primary Care Provider: Gala Romney Other Clinician: Referring Provider: Treating Provider/Extender: Michele Owens in Treatment: 4 Verbal / Phone Orders: No Diagnosis Coding ICD-10 Coding Code Description [id number]  Unspecified open wound, right foot, subsequent encounter E11.621 Type 2 diabetes mellitus with foot ulcer Discharge From [facility] Services Discharge from [place] healed!!! Edema Control - Lymphedema / SCD / Other Right Lower Extremity Elevate legs to the level of the heart or above for 30 minutes daily and/or when sitting, a frequency of: - throughout the day  Avoid standing for long periods of time. Exercise regularly Electronic Signature(s) Signed: 11/26/2020 11:17:17 AM By: Kalman Shan DO Entered By: Kalman Shan on 11/26/2020 11:15:18 -------------------------------------------------------------------------------- Problem List Details Patient Name: Date of Service: Michele Owens, Michele Owens 11/26/2020 10:45 A M Medical Record Number: [id number] Patient Account Number: [id number] Date of Birth/Sex: Treating RN: 1960/09/13 (60 y.o. Michele Owens Primary Care Provider: Gala Romney Other Clinician: Referring Provider: Treating Provider/Extender: Michele Owens in Treatment: 4 Active Problems ICD-10 Encounter Code Description Active Date MDM Diagnosis S91.301D Unspecified open wound, right foot, subsequent encounter 11/05/2020 No Yes E11.621 Type 2 diabetes mellitus with foot ulcer 10/29/2020 No Yes Inactive Problems ICD-10 Code Description Active Date Inactive Date S91.301A Unspecified open wound, right foot, initial encounter 10/29/2020 10/29/2020 Resolved Problems Electronic Signature(s) Signed: 11/26/2020 11:17:17 AM By: Kalman Shan DO Entered By: Kalman Shan on 11/26/2020 11:13:16 -------------------------------------------------------------------------------- Progress Note Details Patient Name: Date of Service: Michele Owens 11/26/2020 10:45 A M Medical Record Number: [id number] Patient Account Number: [id number] Date of Birth/Sex: Treating RN: [birth year] (60 y.o. F) Primary Care Provider: Gala Romney Other  Clinician: Referring Provider: Treating Provider/Extender: Michele Owens in Treatment: 4 Subjective Chief Complaint Information obtained from Patient Right foot wound History of Present Illness (HPI) Admission 7/22 Michele Owens is a 60 year old female with a past medical history of controlled type 2 diabetes on oral agents, essential hypertension, COPD on chronic 4 L O2 via an [place] that presents the clinic for a 2 week history of nonhealing wound to the right medial foot. Patient states that she was out with her mother walking when she fell and landed on her foot landed on a rock. She required assistance from the pizza delivery man to get back up. She visited the ED for this issue. She had imaging of her right foot that showed no acute fracture or dislocation. She currently denies pain. She denies increased warmth erythema or drainage from the wound. 7/29; patient presents for 1 week follow-up. She has tolerated the compression wrap. She is still concerned that she is having a lot of pain to her foot. She denies signs of infection. 8/19; patient presents for follow-up. She missed her last week appointment due to [place]. She has had the wrap on for 2 Owens. She denies any pain to the wound area. She denies signs of infection. Patient History Information obtained from Patient. Family History Cancer - Father, Diabetes - Mother. Social History Former smoker - quit 07/2016, Alcohol Use - Never, Drug Use - No History, Caffeine Use - Never. Medical History Eyes Denies history of Cataracts, Glaucoma, Optic Neuritis Ear/Nose/Mouth/Throat Denies history of Chronic sinus problems/congestion, Middle ear problems Hematologic/Lymphatic Denies history of Anemia, Hemophilia, Human Immunodeficiency Virus, Lymphedema, Sickle Cell Disease Respiratory Patient has history of Asthma, Chronic Obstructive Pulmonary Disease (COPD) - 4L 02 [place] dependent Denies history of Aspiration,  Pneumothorax, Sleep Apnea, Tuberculosis Cardiovascular Patient has history of Congestive Heart Failure, Deep Vein Thrombosis, Hypertension Denies history of Angina, Arrhythmia, Coronary Artery Disease, Hypotension, Myocardial Infarction, Peripheral Arterial Disease, Peripheral Venous Disease, Phlebitis, Vasculitis Gastrointestinal Denies history of Cirrhosis , Colitis, Crohnoos, Hepatitis A, Hepatitis B, Hepatitis C Endocrine Patient has history of Type II Diabetes Denies history of Type I Diabetes Genitourinary Denies history of End Stage Renal Disease Immunological Denies history of Lupus Erythematosus, Raynaudoos, Scleroderma Integumentary (Skin) Denies history of History of Burn Musculoskeletal Patient has history of Gout, Osteoarthritis Denies history of Rheumatoid Arthritis, Osteomyelitis Neurologic Denies history of Dementia, Neuropathy,  Quadriplegia, Paraplegia, Seizure Disorder Oncologic Denies history of Received Radiation Psychiatric Denies history of Anorexia/bulimia, Confinement Anxiety Hospitalization/Surgery History - thyroidectomy-09/2016. Medical A Surgical History Notes nd Oncologic thyroid cancer 2018 Psychiatric anxiety and depression Objective Constitutional respirations regular, non-labored and within target range for patient.. Vitals Time Taken: 9:44 AM, Height: 65 in, Weight: 306 lbs, BMI: 50.9, Temperature: 98.8 F, Pulse: 99 bpm, Respiratory Rate: 20 breaths/min, Blood Pressure: 144/76 mmHg, Capillary Blood Glucose: 139 mg/dl. General Notes: glucose per pt report Cardiovascular 2+ dorsalis pedis/posterior tibialis pulses. Psychiatric pleasant and cooperative. General Notes: Right foot: T the medial aspect there is callus and a small scab over the previous wound site. No drainage noted. No fluctuance noted. No o signs of infection. No tenderness on exam. Integumentary (Hair, Skin) Wound #1 status is Healed - Epithelialized. Original cause of  wound was Trauma. The date acquired was: 10/22/2020. The wound has been in treatment 4 Owens. The wound is located on the Right,Medial Foot. The wound measures 0cm length x 0cm width x 0cm depth; 0cm^2 area and 0cm^3 volume. There is no tunneling or undermining noted. There is a none present amount of drainage noted. The wound margin is distinct with the outline attached to the wound base. There is no granulation within the wound bed. There is no necrotic tissue within the wound bed. Assessment Active Problems ICD-10 Unspecified open wound, right foot, subsequent encounter Type 2 diabetes mellitus with foot ulcer Patient was unable to attend the last clinic visit due to [place]. She has had the wrap on for 2 Owens. Surprisingly the wound is healed. She does have callus with a tiny scab on it and so I recommended she use Vaseline daily to this area. I recommended she follow-up if she notices any drainage from this area Or has any questions or concerns. Plan Discharge From [facility] Services: Discharge from [place] healed!!! Edema Control - Lymphedema / SCD / Other: Elevate legs to the level of the heart or above for 30 minutes daily and/or when sitting, a frequency of: - throughout the day Avoid standing for long periods of time. Exercise regularly 1. Discharge from clinic due to closed wound 2. Vaseline daily 3. Follow-up as needed Electronic Signature(s) Signed: 11/26/2020 11:17:17 AM By: Kalman Shan DO Entered By: Kalman Shan on 11/26/2020 11:16:47 -------------------------------------------------------------------------------- HxROS Details Patient Name: Date of Service: Michele Owens, Michele Owens 11/26/2020 10:45 A M Medical Record Number: [id number] Patient Account Number: [id number] Date of Birth/Sex: Treating RN: Jan 03, 1961 (60 y.o. F) Primary Care Provider: Gala Romney Other Clinician: Referring Provider: Treating Provider/Extender: Lamont Snowball in Treatment: 4 Information Obtained From Patient Eyes Medical History: Negative for: Cataracts; Glaucoma; Optic Neuritis Ear/Nose/Mouth/Throat Medical History: Negative for: Chronic sinus problems/congestion; Middle ear problems Hematologic/Lymphatic Medical History: Negative for: Anemia; Hemophilia; Human Immunodeficiency Virus; Lymphedema; Sickle Cell Disease Respiratory Medical History: Positive for: Asthma; Chronic Obstructive Pulmonary Disease (COPD) - 4L 02 [place] dependent Negative for: Aspiration; Pneumothorax; Sleep Apnea; Tuberculosis Cardiovascular Medical History: Positive for: Congestive Heart Failure; Deep Vein Thrombosis; Hypertension Negative for: Angina; Arrhythmia; Coronary Artery Disease; Hypotension; Myocardial Infarction; Peripheral Arterial Disease; Peripheral Venous Disease; Phlebitis; Vasculitis Gastrointestinal Medical History: Negative for: Cirrhosis ; Colitis; Crohns; Hepatitis A; Hepatitis B; Hepatitis C Endocrine Medical History: Positive for: Type II Diabetes Negative for: Type I Diabetes Time with diabetes: unsure Treated with: Oral agents, Diet Blood sugar tested every day: Yes Tested : daily Genitourinary Medical History: Negative for: End Stage Renal Disease Immunological Medical History: Negative  for: Lupus Erythematosus; Raynauds; Scleroderma Integumentary (Skin) Medical History: Negative for: History of Burn Musculoskeletal Medical History: Positive for: Gout; Osteoarthritis Negative for: Rheumatoid Arthritis; Osteomyelitis Neurologic Medical History: Negative for: Dementia; Neuropathy; Quadriplegia; Paraplegia; Seizure Disorder Oncologic Medical History: Negative for: Received Radiation Past Medical History Notes: thyroid cancer 2018 Psychiatric Medical History: Negative for: Anorexia/bulimia; Confinement Anxiety Past Medical History Notes: anxiety and depression Immunizations Implantable Devices No devices  added Hospitalization / Surgery History Type of Hospitalization/Surgery thyroidectomy-09/2016 Family and Social History Cancer: Yes - Father; Diabetes: Yes - Mother; Former smoker - quit 07/2016; Alcohol Use: Never; Drug Use: No History; Caffeine Use: Never; Financial Concerns: No; Food, Clothing or Shelter Needs: No; Support System Lacking: No; Transportation Concerns: No Electronic Signature(s) Signed: 11/26/2020 11:17:17 AM By: Kalman Shan DO Signed: 11/26/2020 11:17:17 AM By: Kalman Shan DO Entered By: Kalman Shan on 11/26/2020 11:14:26 -------------------------------------------------------------------------------- SuperBill Details Patient Name: Date of Service: Michele Owens 11/26/2020 Medical Record Number: [id number] Patient Account Number: [id number] Date of Birth/Sex: Treating RN: 1961-01-03 (60 y.o. Michele Owens Primary Care Provider: Gala Romney Other Clinician: Referring Provider: Treating Provider/Extender: Michele Owens in Treatment: 4 Diagnosis Coding ICD-10 Codes Code Description [id number] Unspecified open wound, right foot, subsequent encounter E11.621 Type 2 diabetes mellitus with foot ulcer Facility Procedures CPT4 Code: [id number] Description: 99213 - WOUND CARE VISIT-LEV 3 EST PT Modifier: Quantity: 1 Physician Procedures : CPT4 Code Description Modifier [id number] - WC PHYS LEVEL 3 - EST PT ICD-10 Diagnosis Description S91.301D Unspecified open wound, right foot, subsequent encounter E11.621 Type 2 diabetes mellitus with foot ulcer Quantity: 1 Electronic Signature(s) Signed: 11/26/2020 11:17:17 AM By: Kalman Shan DO Entered By: Kalman Shan on 11/26/2020 11:16:58

## 2020-11-29 DIAGNOSIS — Z7689 Persons encountering health services in other specified circumstances: Secondary | ICD-10-CM | POA: Diagnosis not present

## 2020-12-01 ENCOUNTER — Other Ambulatory Visit: Payer: Self-pay | Admitting: Obstetrics and Gynecology

## 2020-12-01 ENCOUNTER — Other Ambulatory Visit: Payer: Self-pay

## 2020-12-01 DIAGNOSIS — M25562 Pain in left knee: Secondary | ICD-10-CM | POA: Diagnosis not present

## 2020-12-01 DIAGNOSIS — Z7689 Persons encountering health services in other specified circumstances: Secondary | ICD-10-CM | POA: Diagnosis not present

## 2020-12-01 NOTE — Patient Outreach (Signed)
Care Coordination  12/01/2020  Michele Owens 08-Feb-1961 [id number]  RNCM returned patient's phone call.  Patient's Aunt recently died as well as some friends of the family-patient wanted to update me and let me know.  [name] RN, BSN [place]  [employer] Managed Medicaid High Risk [phone].

## 2020-12-07 ENCOUNTER — Other Ambulatory Visit: Payer: Self-pay | Admitting: Obstetrics and Gynecology

## 2020-12-07 ENCOUNTER — Other Ambulatory Visit: Payer: Self-pay

## 2020-12-07 NOTE — Patient Outreach (Signed)
Care Coordination  12/07/2020  Michele Owens 29-May-1960 [id number]  RNCM returned patient's phone call-patient wanted phone number for [facility] Care=patient given phone number, [phone].  [name] RN, BSN [name]  [employer] - Managed Medicaid High Risk [phone].

## 2020-12-07 NOTE — Patient Outreach (Signed)
Care Coordination  12/07/2020  Gwendolin Pineault 18-Jul-1960 [id number]  RNCM returned patient's phone call.  Patient unhappy where she lives, would like to move to where Mother lives.  Patient will follow up.  [name] RN, BSN   [facility] [employer] Managed Medicaid High Risk [phone].

## 2020-12-08 ENCOUNTER — Other Ambulatory Visit: Payer: Medicaid Other | Admitting: Obstetrics and Gynecology

## 2020-12-08 NOTE — Patient Outreach (Signed)
Care Coordination  12/08/2020  Shareece Sonnentag [birth year] [id number]  RNCM returned patient's phone call-no answer, left message.  [name] RN, BSN [place]  [facility] - Managed Medicaid High Risk [phone].

## 2020-12-09 ENCOUNTER — Other Ambulatory Visit: Payer: Self-pay

## 2020-12-09 ENCOUNTER — Encounter (HOSPITAL_COMMUNITY): Payer: Self-pay

## 2020-12-09 ENCOUNTER — Ambulatory Visit: Payer: Self-pay | Admitting: *Deleted

## 2020-12-09 ENCOUNTER — Emergency Department (HOSPITAL_COMMUNITY): Payer: Medicaid Other

## 2020-12-09 ENCOUNTER — Emergency Department (HOSPITAL_COMMUNITY)
Admission: EM | Admit: 2020-12-09 | Discharge: 2020-12-09 | Disposition: A | Discharge status: Home / Self Care | Payer: Medicaid Other | Attending: Emergency Medicine | Admitting: Emergency Medicine

## 2020-12-09 DIAGNOSIS — I499 Cardiac arrhythmia, unspecified: Secondary | ICD-10-CM | POA: Diagnosis not present

## 2020-12-09 DIAGNOSIS — R1110 Vomiting, unspecified: Secondary | ICD-10-CM | POA: Diagnosis not present

## 2020-12-09 DIAGNOSIS — I471 Supraventricular tachycardia: Secondary | ICD-10-CM | POA: Diagnosis not present

## 2020-12-09 DIAGNOSIS — J441 Chronic obstructive pulmonary disease with (acute) exacerbation: Secondary | ICD-10-CM | POA: Diagnosis not present

## 2020-12-09 DIAGNOSIS — Z743 Need for continuous supervision: Secondary | ICD-10-CM | POA: Diagnosis not present

## 2020-12-09 DIAGNOSIS — Z79899 Other long term (current) drug therapy: Secondary | ICD-10-CM | POA: Diagnosis not present

## 2020-12-09 DIAGNOSIS — Z87891 Personal history of nicotine dependence: Secondary | ICD-10-CM | POA: Insufficient documentation

## 2020-12-09 DIAGNOSIS — E1169 Type 2 diabetes mellitus with other specified complication: Secondary | ICD-10-CM | POA: Insufficient documentation

## 2020-12-09 DIAGNOSIS — I110 Hypertensive heart disease with heart failure: Secondary | ICD-10-CM | POA: Insufficient documentation

## 2020-12-09 DIAGNOSIS — I5032 Chronic diastolic (congestive) heart failure: Secondary | ICD-10-CM | POA: Insufficient documentation

## 2020-12-09 DIAGNOSIS — Z7984 Long term (current) use of oral hypoglycemic drugs: Secondary | ICD-10-CM | POA: Insufficient documentation

## 2020-12-09 DIAGNOSIS — R000 Tachycardia, unspecified: Secondary | ICD-10-CM | POA: Diagnosis not present

## 2020-12-09 LAB — BASIC METABOLIC PANEL
Anion gap: 11 (ref 5–15)
BUN: 15 mg/dL (ref 6–20)
CO2: 30 mmol/L (ref 22–32)
Calcium: 8.1 mg/dL — ABNORMAL LOW (ref 8.9–10.3)
Chloride: 97 mmol/L — ABNORMAL LOW (ref 98–111)
Creatinine, Ser: 0.8 mg/dL (ref 0.44–1.00)
GFR, Estimated: >60 mL/min (ref >60)
Glucose, Bld: 115 mg/dL — ABNORMAL HIGH (ref 70–99)
Potassium: 3.4 mmol/L — ABNORMAL LOW (ref 3.5–5.1)
Sodium: 138 mmol/L (ref 135–145)

## 2020-12-09 LAB — CBC
HCT: 37.6 % (ref 36.0–46.0)
Hemoglobin: 12.1 g/dL (ref 12.0–15.0)
MCH: 29.9 pg (ref 26.0–34.0)
MCHC: 32.2 g/dL (ref 30.0–36.0)
MCV: 92.8 fL (ref 80.0–100.0)
Platelets: 325 10*3/uL (ref 150–400)
RBC: 4.05 MIL/uL (ref 3.87–5.11)
RDW: 14.3 % (ref 11.5–15.5)
WBC: 13.8 10*3/uL — ABNORMAL HIGH (ref 4.0–10.5)
nRBC: 0 % (ref 0.0–0.2)

## 2020-12-09 LAB — MAGNESIUM: Magnesium: 1.6 mg/dL — ABNORMAL LOW (ref 1.7–2.4)

## 2020-12-09 LAB — TSH: TSH: 1.079 u[IU]/mL (ref 0.350–4.500)

## 2020-12-09 MED ORDER — LORAZEPAM 1 MG PO TABS
1.0000 mg | ORAL_TABLET | Freq: Once | ORAL | Status: AC
  Administered 2020-12-09: 1 mg via ORAL
  Filled 2020-12-09: qty 1

## 2020-12-09 MED ORDER — SODIUM CHLORIDE 0.9 % IV SOLN: INTRAVENOUS | Status: DC | PRN

## 2020-12-09 MED ORDER — MAGNESIUM SULFATE 2 GM/50ML IV SOLN
2.0000 g | Freq: Once | INTRAVENOUS | Status: AC
  Administered 2020-12-09: 2 g via INTRAVENOUS
  Filled 2020-12-09: qty 50

## 2020-12-09 MED ORDER — HYDROCODONE-ACETAMINOPHEN 5-325 MG PO TABS
1.0000 | ORAL_TABLET | Freq: Once | ORAL | Status: AC
Start: 2020-12-09 — End: 2020-12-09
  Administered 2020-12-09: 1 via ORAL
  Filled 2020-12-09: qty 1

## 2020-12-09 NOTE — Social Work (Signed)
CSW met with Pt at bedside. Pt still receiving IV medication. CSW left cab voucher with floor RN

## 2020-12-09 NOTE — Telephone Encounter (Signed)
C/o heart fluttering and heart palpitations and requesting NT to "call someone" for her. Patient attempted to give NT B/P reading and unable to give specific reading . C/o difficulty breathing . Called 911 for patient and patient reported she did not want to answer any further questions due to she did not feel like talking because of symptoms. Call completed . NT called patient back at 7:27 pm and EMS evaluating patient .

## 2020-12-09 NOTE — ED Provider Notes (Signed)
[facility] EMERGENCY DEPARTMENT Provider Note   CSN: [id number] Arrival date & time: 12/09/20  1955     History Chief Complaint  Patient presents with   Tachycardia    Michele Owens is a 60 y.o. female.  Pt presents to the ED today with tachycardia.  Pt said she has a lot of bills to pay on the first of the month and has a lot of errands to run.  She is more active than normal.  She came home from doing her errands and felt some palpitations.  She said she is also out of her pain meds and has been in a lot of pain for her gout and arthritis.  She is due for a refill not until next week.  Pt was in SVT when EMS arrived.  She was given 6 mg adenosine IV and converted to NSR.  She feels much better now and only complains of knee pain.      Past Medical History:  Diagnosis Date   Anxiety    Arthritis    Asthma    Chronic diastolic CHF (congestive heart failure) (HCC)    COPD (chronic obstructive pulmonary disease) (HCC)    Uses Oxygen at night   Depression    Diabetes mellitus without complication (HCC)    Dyspnea    occ   Family history of thyroid cancer    GERD (gastroesophageal reflux disease)    Gout    Headache    migraines   History of DVT of lower extremity    History of nuclear stress test    Myoview 2/17:  Low risk stress nuclear study with a small, moderate intensity, partially reversible inferior lateral defect consistent with small prior infarct and minimal peri-infarct ischemia; EF 68 with normal wall motion   Hypertension    Pneumonia    Thyroid cancer ([place]) 09/23/2016    Patient Active Problem List   Diagnosis Date Noted   Acute on chronic respiratory failure with hypoxia and hypercapnia ([place]) 04/18/2020   Acute on chronic respiratory failure with hypoxia ([place]) 04/18/2020   Hemoptysis 53/97/6734   Acute metabolic encephalopathy 19/37/9024   Acute encephalopathy 04/18/2020   Chronic diastolic CHF (congestive heart failure) ([place])  04/18/2020   Sepsis ([place]) 04/18/2020   Morbid obesity with BMI of 50.0-59.9, adult ([place])    Fall at home, initial encounter 06/23/2018   HNP (herniated nucleus pulposus), thoracic 06/23/2018   CHF (congestive heart failure) ([place]) 06/18/2018   Hypertensive disorder 08/28/2017   Obesity 08/28/2017   COPD exacerbation ([place]) 08/04/2017   Acute and chronic respiratory failure with hypercapnia ([place])    Pulmonary HTN ([place])    Genetic testing 02/27/2017   Family history of thyroid cancer    Hypomagnesemia 10/03/2016   Atypical chest pain    HLD (hyperlipidemia) 09/23/2016   Gout 09/23/2016   DVT (deep vein thrombosis) in pregnancy 09/23/2016   Thyroid cancer ([place]) 09/23/2016   Hypocalcemia 09/23/2016   AKI (acute kidney injury) ([place]) 09/23/2016   Acute pulmonary edema ([place])    Acute hypoxemic respiratory failure ([place])    Ventilator dependent ([place])    Chronic respiratory failure with hypoxia and hypercapnia ([place]) 07/16/2016   CAP (community acquired pneumonia) 01/23/2016   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 01/23/2016   Pulmonary hypertension ([place]) 01/23/2016   Obesity hypoventilation syndrome ([place]) 08/26/2015   Dyslipidemia associated with type 2 diabetes mellitus ([place]) 04/24/2015   Leukocytosis 04/24/2015   Controlled type 2 diabetes mellitus without  complication, without long-term current use of insulin ([place]) 04/24/2015   Anxiety 04/24/2015   Benign essential HTN 04/24/2015   Normocytic anemia 04/24/2015   OSA (obstructive sleep apnea) 05/10/2012   Tobacco abuse 07/04/2011    Past Surgical History:  Procedure Laterality Date   BUNIONECTOMY Bilateral    CARDIAC CATHETERIZATION N/A 06/23/2015   Procedure: Right/Left Heart Cath and Coronary Angiography;  Surgeon: [name], MD;  Location: [name];  Service: Cardiovascular;  Laterality: N/A;   COLONOSCOPY WITH PROPOFOL N/A 12/15/2015   Procedure: COLONOSCOPY WITH PROPOFOL;  Surgeon: [name], MD;   Location: [place];  Service: Endoscopy;  Laterality: N/A;   RIGHT HEART CATH N/A 10/01/2019   Procedure: RIGHT HEART CATH;  Surgeon: [name], MD;  Location: [facility];  Service: Cardiovascular;  Laterality: N/A;   THYROIDECTOMY  09/19/2016   THYROIDECTOMY N/A 09/19/2016   Procedure: TOTAL THYROIDECTOMY;  Surgeon: [name], MD;  Location: [place];  Service: General;  Laterality: N/A;   TONSILLECTOMY     TOTAL ABDOMINAL HYSTERECTOMY  07/14/10     OB History   No obstetric history on file.     Family History  Problem Relation Age of Onset   Cancer Father        thought to be due to exposure to concrete   Diabetes Mother    Thyroid cancer Mother        dx in her 55s-60s   Cancer Maternal Uncle        2 uncles with cancer NOS   Brain cancer Paternal Aunt    Cancer Cousin        maternal first cousin - NOS   Cancer Cousin        maternal first cousin - NOS    Social History   Tobacco Use   Smoking status: Former    Packs/day: 0.50    Years: 41.00    Pack years: 20.50    Types: Cigarettes    Quit date: 07/2016    Years since quitting: 4.4   Smokeless tobacco: Never   Tobacco comments:    Starting to [employer] -- using Nicotine Patch  Vaping Use   Vaping Use: Never used  Substance Use Topics   Alcohol use: Not Currently    Alcohol/week: 0.0 standard drinks    Comment: ? none now   Drug use: Never    Comment: none for long time    Home Medications Prior to Admission medications   Medication Sig Start Date End Date Taking? Authorizing Provider  albuterol (PROVENTIL HFA;VENTOLIN HFA) 108 (90 Base) MCG/ACT inhaler Inhale 1-2 puffs into the lungs every 6 (six) hours as needed for wheezing or shortness of breath. 04/23/18   Long, [name], MD  allopurinol (ZYLOPRIM) 100 MG tablet Take 100 mg by mouth daily.    [provider]  benzonatate (TESSALON) 200 MG capsule Take 1 capsule (200 mg total) by mouth in the morning, at noon, and at bedtime. 7  DS 04/13/20   Regalado, Jerald Kief A, MD  Blood Glucose Monitoring Suppl (ACCU-CHEK AVIVA PLUS) w/Device KIT 1 each by Other route daily.  08/07/17   [provider]  butalbital-acetaminophen-caffeine (FIORICET) 50-325-40 MG tablet Take 1 tablet by mouth 2 (two) times daily as needed for migraine. 08/13/20   [provider]  calcitRIOL (ROCALTROL) 0.5 MCG capsule Take 1 capsule (0.5 mcg total) by mouth daily. 09/26/16   Hongalgi, [name], MD  carvedilol (COREG) 6.25  MG tablet Take 1 tablet (6.25 mg total) by mouth 2 (two) times daily with a meal. 01/23/18   Bensimhon, [name], MD  clonazePAM (KLONOPIN) 2 MG tablet Take 1 tablet (2 mg total) by mouth 3 (three) times daily. for anxiety Patient taking differently: Take 2 mg by mouth 3 (three) times daily. 06/23/19   [name], MD  cyclobenzaprine (FLEXERIL) 10 MG tablet Take 10 mg by mouth 3 (three) times daily. 11/19/19   [provider]  diclofenac sodium (VOLTAREN) 1 % GEL Apply 2 g topically 4 (four) times daily as needed (for pain). Patient not taking: No sig reported 06/19/18   [name], MD  EPINEPHrine 0.3 mg/0.3 mL IJ SOAJ injection Inject 0.3 mg into the muscle once as needed for anaphylaxis.    [provider]  FEROSUL 325 (65 Fe) MG tablet Take 325 mg by mouth daily. 03/22/17   [provider]  glucose blood test strip Use as instructed to check blood sugar up to 4 times daily Patient taking differently: 1 each by Other route See admin instructions. Use as instructed to check blood sugar up to 4 times daily 08/06/17   [name], DO  guaiFENesin (MUCINEX) 600 MG 12 hr tablet Take 2 tablets (1,200 mg total) by mouth 2 (two) times daily. 04/13/20   Regalado, Belkys A, MD  ipratropium-albuterol (DUONEB) 0.5-2.5 (3) MG/3ML SOLN Take 3 mLs by nebulization 4 (four) times daily.  04/05/17   [provider]  levothyroxine (SYNTHROID, LEVOTHROID) 112 MCG tablet Take 224 mcg by mouth daily before  breakfast. 07/11/17   [provider]  lisinopril (PRINIVIL,ZESTRIL) 5 MG tablet Take 1 tablet (5 mg total) by mouth daily. Please call for office visit [phone] Patient taking differently: Take 5 mg by mouth daily. 08/15/17   [name], MD  loperamide (IMODIUM) 2 MG capsule Take 1 capsule (2 mg total) by mouth 4 (four) times daily as needed for diarrhea or loose stools. 11/02/34   [name], MD  metFORMIN (GLUCOPHAGE) 500 MG tablet Take 1 tablet (500 mg total) by mouth 2 (two) times daily with a meal. 12/11/14   [name], MD  nitroGLYCERIN (NITROSTAT) 0.4 MG SL tablet Place 1 tablet (0.4 mg total) under the tongue every 5 (five) minutes as needed for chest pain. 06/19/18   [name], MD  nystatin cream (MYCOSTATIN) Apply 1 application topically 2 (two) times daily as needed for dry skin. 04/13/20   Regalado, Belkys A, MD  nystatin ointment (MYCOSTATIN) Apply 1 application topically 2 (two) times daily. 09/10/20   [provider]  omeprazole (PRILOSEC) 20 MG capsule Take 20 mg by mouth 2 (two) times daily before a meal. 11/19/19   [provider]  oxyCODONE-acetaminophen (PERCOCET) 10-325 MG tablet Take 1 tablet by mouth every 6 (six) hours as needed for pain. 12/03/19   [provider]  OXYGEN Inhale 2-3 L into the lungs continuous.    [provider]  oxymetazoline (AFRIN NASAL SPRAY) 0.05 % nasal spray Place 1 spray into both nostrils 2 (two) times daily. 10/04/20   Pearson Grippe, DO  polyethylene glycol (MIRALAX / GLYCOLAX) 17 g packet Take 17 g by mouth daily as needed for mild constipation.    [provider]  potassium chloride SA (K-DUR,KLOR-CON) 20 MEQ tablet Take 1 tablet (20 mEq total) by mouth 2 (two) times daily. 07/24/18   [name], MD  predniSONE (DELTASONE) 20 MG tablet Take 2 tablets (40 mg  total) by mouth daily. 11/11/20   Horton, Barbette Hair, MD  rosuvastatin (CRESTOR) 10 MG tablet Take 10 mg by mouth  every evening. 11/19/19   [provider]  senna-docusate (SENNA-PLUS) 8.6-50 MG tablet Take 1 tablet by mouth daily.    [provider]  torsemide (DEMADEX) 20 MG tablet TAKE 4 (FOUR) TABLETS BY MOUTH DAILY (2AM+ 2NOON) 11/10/20   [name], MD  traZODone (DESYREL) 50 MG tablet Take 100 mg by mouth at bedtime. 11/19/19   [provider]  triamcinolone cream (KENALOG) 0.1 % Apply 1 application topically 2 (two) times daily. 09/10/20   [provider]  umeclidinium-vilanterol (ANORO ELLIPTA) 62.5-25 MCG/INH AEPB Inhale 1 puff into the lungs daily.    [provider]    Allergies    Bee venom, Fioricet [butalbital-apap-caffeine], Ibuprofen, Lamisil [terbinafine], and Nsaids  Review of Systems   Review of Systems  All other systems reviewed and are negative.  Physical Exam Updated Vital Signs BP 110/62   Pulse 92   Temp 98.8 F (37.1 C) (Oral)   Resp 17   Ht _0  (1.651 m)   Wt 135.2 kg   SpO2 99%   BMI 49.60 kg/m   Physical Exam  ED Results / Procedures / Treatments   Labs (all labs ordered are listed, but only abnormal results are displayed) Labs Reviewed  BASIC METABOLIC PANEL - Abnormal; Notable for the following components:      Result Value   Potassium 3.4 (*)    Chloride 97 (*)    Glucose, Bld 115 (*)    Calcium 8.1 (*)    All other components within normal limits  MAGNESIUM - Abnormal; Notable for the following components:   Magnesium 1.6 (*)    All other components within normal limits  CBC - Abnormal; Notable for the following components:   WBC 13.8 (*)    All other components within normal limits  TSH    EKG EKG Interpretation  Date/Time:  Thursday December 09 2020 22:15:20 EDT Ventricular Rate:  93 PR Interval:  155 QRS Duration: 84 QT Interval:  373 QTC Calculation: 464 R Axis:   49 Text Interpretation: Sinus rhythm Probable left atrial enlargement Low voltage, precordial leads No significant change  since last tracing Confirmed by [name] [id number]) on 12/09/2020 10:30:17 PM  Radiology DG Chest Port 1 View  Result Date: 12/09/2020 CLINICAL DATA:  Tachycardia, SVT EXAM: PORTABLE CHEST 1 VIEW COMPARISON:  11/10/2020 FINDINGS: Lingular/left lower lobe scarring. Right lung is clear. No pleural effusion or pneumothorax. The heart is normal in size.  Thoracic aortic atherosclerosis. Surgical clips overlying the neck. IMPRESSION: No evidence of acute cardiopulmonary disease. Electronically Signed   By: Julian Hy M.D.   On: 12/09/2020 21:15    Procedures Procedures   Medications Ordered in ED Medications  magnesium sulfate IVPB 2 g 50 mL (2 g Intravenous New Bag/Given 12/09/20 2202)  0.9 %  sodium chloride infusion ( Intravenous New Bag/Given 12/09/20 2201)  HYDROcodone-acetaminophen (NORCO/VICODIN) 5-325 MG per tablet 1 tablet (1 tablet Oral Given 12/09/20 2138)  LORazepam (ATIVAN) tablet 1 mg (1 mg Oral Given 12/09/20 2138)    ED Course  I have reviewed the triage vital signs and the nursing notes.  Pertinent labs & imaging results that were available during my care of the patient were reviewed by me and considered in my medical decision making (see chart for details).    MDM Rules/Calculators/A&P  Pt feels better after pain meds/ativan.  Pt does not have a hx of SVT, but is on Coreg.  I am going to keep her on her current dose.  Pt is referred to cards.  She is hypomagnesemic, so she is given 2 g mg iv.   Pt is stable for d/c.  She is to return if worse.  Final Clinical Impression(s) / ED Diagnoses Final diagnoses:  SVT (supraventricular tachycardia) ([place])  Hypomagnesemia    Rx / DC Orders ED Discharge Orders          Ordered    Ambulatory referral to Cardiology        12/09/20 2227             [name], MD 12/09/20 2236

## 2020-12-09 NOTE — Telephone Encounter (Signed)
Reason for Disposition  Sounds like a life-threatening emergency to the triager  Answer Assessment - Initial Assessment Questions 1. DESCRIPTION: "Please describe your heart rate or heartbeat that you are having" (e.g., fast/slow, regular/irregular, skipped or extra beats, "palpitations")     Heart is racing and fluttering. 2. ONSET: "When did it start?" (Minutes, hours or days)      Prior to call  3. DURATION: "How long does it last" (e.g., seconds, minutes, hours)     Happening now  4. PATTERN "Does it come and go, or has it been constant since it started?"  "Does it get worse with exertion?"   "Are you feeling it now?"     na 5. TAP: "Using your hand, can you tap out what you are feeling on a chair or table in front of you, so that I can hear?" (Note: not all patients can do this)       Na  6. HEART RATE: "Can you tell me your heart rate?" "How many beats in 15 seconds?"  (Note: not all patients can do this)       na 7. RECURRENT SYMPTOM: "Have you ever had this before?" If Yes, ask: "When was the last time?" and "What happened that time?"      Na  8. CAUSE: "What do you think is causing the palpitations?"     Na  9. CARDIAC HISTORY: "Do you have any history of heart disease?" (e.g., heart attack, angina, bypass surgery, angioplasty, arrhythmia)      na 10. OTHER SYMPTOMS: "Do you have any other symptoms?" (e.g., dizziness, chest pain, sweating, difficulty breathing)       Difficulty breathing  11. PREGNANCY: "Is there any chance you are pregnant?" "When was your last menstrual period?"       na  Protocols used: Heart Rate and Heartbeat Questions-A-AH

## 2020-12-13 DIAGNOSIS — I110 Hypertensive heart disease with heart failure: Secondary | ICD-10-CM | POA: Diagnosis not present

## 2020-12-14 ENCOUNTER — Other Ambulatory Visit: Payer: Self-pay | Admitting: Obstetrics and Gynecology

## 2020-12-14 DIAGNOSIS — I110 Hypertensive heart disease with heart failure: Secondary | ICD-10-CM | POA: Diagnosis not present

## 2020-12-14 NOTE — Patient Outreach (Signed)
Care Coordination  12/14/2020  Michele Owens Jul 22, 1960 [id number]  RNCM returned patient's phone call-no answer, left message.  [name] RN, BSN [place]  [facility] [employer] - Managed Medicaid High Risk [phone].

## 2020-12-15 DIAGNOSIS — I110 Hypertensive heart disease with heart failure: Secondary | ICD-10-CM | POA: Diagnosis not present

## 2020-12-16 ENCOUNTER — Other Ambulatory Visit: Payer: Medicaid Other | Admitting: Obstetrics and Gynecology

## 2020-12-16 ENCOUNTER — Other Ambulatory Visit (HOSPITAL_COMMUNITY): Payer: Self-pay | Admitting: Cardiology

## 2020-12-16 ENCOUNTER — Other Ambulatory Visit: Payer: Self-pay

## 2020-12-16 DIAGNOSIS — I110 Hypertensive heart disease with heart failure: Secondary | ICD-10-CM | POA: Diagnosis not present

## 2020-12-16 NOTE — Patient Outreach (Signed)
Care Coordination  12/16/2020  Michele Owens 11-21-1960 [id number]  RNCM returned patient's phone call and provided phone number for [facility] at her request, [phone].  [name] RN, BSN [place]  [facility] [employer] - Managed Medicaid High Risk [phone].

## 2020-12-17 DIAGNOSIS — I110 Hypertensive heart disease with heart failure: Secondary | ICD-10-CM | POA: Diagnosis not present

## 2020-12-20 DIAGNOSIS — I110 Hypertensive heart disease with heart failure: Secondary | ICD-10-CM | POA: Diagnosis not present

## 2020-12-21 DIAGNOSIS — I110 Hypertensive heart disease with heart failure: Secondary | ICD-10-CM | POA: Diagnosis not present

## 2020-12-22 ENCOUNTER — Other Ambulatory Visit: Payer: Medicaid Other | Admitting: Obstetrics and Gynecology

## 2020-12-22 ENCOUNTER — Other Ambulatory Visit: Payer: Self-pay

## 2020-12-22 DIAGNOSIS — I110 Hypertensive heart disease with heart failure: Secondary | ICD-10-CM | POA: Diagnosis not present

## 2020-12-22 NOTE — Patient Outreach (Signed)
Care Coordination  12/22/2020  Michele Owens Aug 15, 1960 [id number]  RNCM returned patient's phone call and answered questions.  [name] RN, BSN [place]  [facility] - Managed Medicaid High Risk [phone].

## 2020-12-23 ENCOUNTER — Encounter (HOSPITAL_COMMUNITY): Payer: Self-pay | Admitting: Internal Medicine

## 2020-12-23 ENCOUNTER — Emergency Department (HOSPITAL_COMMUNITY): Payer: Medicaid Other

## 2020-12-23 ENCOUNTER — Other Ambulatory Visit: Payer: Self-pay | Admitting: Obstetrics and Gynecology

## 2020-12-23 ENCOUNTER — Inpatient Hospital Stay (HOSPITAL_COMMUNITY)
Admission: EM | Admit: 2020-12-23 | Discharge: 2020-12-25 | DRG: 291 | Disposition: A | Discharge status: Home / Self Care | Payer: Medicaid Other | Attending: Internal Medicine | Admitting: Internal Medicine

## 2020-12-23 ENCOUNTER — Other Ambulatory Visit: Payer: Self-pay

## 2020-12-23 DIAGNOSIS — I110 Hypertensive heart disease with heart failure: Principal | ICD-10-CM | POA: Diagnosis present

## 2020-12-23 DIAGNOSIS — Z87891 Personal history of nicotine dependence: Secondary | ICD-10-CM | POA: Diagnosis not present

## 2020-12-23 DIAGNOSIS — Z79899 Other long term (current) drug therapy: Secondary | ICD-10-CM

## 2020-12-23 DIAGNOSIS — E785 Hyperlipidemia, unspecified: Secondary | ICD-10-CM | POA: Diagnosis present

## 2020-12-23 DIAGNOSIS — E119 Type 2 diabetes mellitus without complications: Secondary | ICD-10-CM | POA: Diagnosis not present

## 2020-12-23 DIAGNOSIS — Z808 Family history of malignant neoplasm of other organs or systems: Secondary | ICD-10-CM | POA: Diagnosis not present

## 2020-12-23 DIAGNOSIS — R609 Edema, unspecified: Secondary | ICD-10-CM | POA: Diagnosis not present

## 2020-12-23 DIAGNOSIS — K219 Gastro-esophageal reflux disease without esophagitis: Secondary | ICD-10-CM | POA: Diagnosis present

## 2020-12-23 DIAGNOSIS — J441 Chronic obstructive pulmonary disease with (acute) exacerbation: Secondary | ICD-10-CM | POA: Diagnosis present

## 2020-12-23 DIAGNOSIS — E662 Morbid (severe) obesity with alveolar hypoventilation: Secondary | ICD-10-CM | POA: Diagnosis present

## 2020-12-23 DIAGNOSIS — J9692 Respiratory failure, unspecified with hypercapnia: Secondary | ICD-10-CM | POA: Diagnosis present

## 2020-12-23 DIAGNOSIS — I10 Essential (primary) hypertension: Secondary | ICD-10-CM | POA: Diagnosis present

## 2020-12-23 DIAGNOSIS — M1990 Unspecified osteoarthritis, unspecified site: Secondary | ICD-10-CM | POA: Diagnosis present

## 2020-12-23 DIAGNOSIS — I272 Pulmonary hypertension, unspecified: Secondary | ICD-10-CM | POA: Diagnosis present

## 2020-12-23 DIAGNOSIS — J9622 Acute and chronic respiratory failure with hypercapnia: Secondary | ICD-10-CM | POA: Diagnosis not present

## 2020-12-23 DIAGNOSIS — Z7984 Long term (current) use of oral hypoglycemic drugs: Secondary | ICD-10-CM

## 2020-12-23 DIAGNOSIS — Z91030 Bee allergy status: Secondary | ICD-10-CM

## 2020-12-23 DIAGNOSIS — Z79890 Hormone replacement therapy (postmenopausal): Secondary | ICD-10-CM

## 2020-12-23 DIAGNOSIS — Z9981 Dependence on supplemental oxygen: Secondary | ICD-10-CM | POA: Diagnosis not present

## 2020-12-23 DIAGNOSIS — Z794 Long term (current) use of insulin: Secondary | ICD-10-CM | POA: Diagnosis not present

## 2020-12-23 DIAGNOSIS — G9341 Metabolic encephalopathy: Secondary | ICD-10-CM | POA: Diagnosis present

## 2020-12-23 DIAGNOSIS — Z720 Tobacco use: Secondary | ICD-10-CM | POA: Diagnosis present

## 2020-12-23 DIAGNOSIS — D649 Anemia, unspecified: Secondary | ICD-10-CM | POA: Diagnosis present

## 2020-12-23 DIAGNOSIS — Z85850 Personal history of malignant neoplasm of thyroid: Secondary | ICD-10-CM

## 2020-12-23 DIAGNOSIS — M109 Gout, unspecified: Secondary | ICD-10-CM | POA: Diagnosis present

## 2020-12-23 DIAGNOSIS — J9602 Acute respiratory failure with hypercapnia: Secondary | ICD-10-CM | POA: Diagnosis not present

## 2020-12-23 DIAGNOSIS — Z8616 Personal history of COVID-19: Secondary | ICD-10-CM | POA: Diagnosis not present

## 2020-12-23 DIAGNOSIS — J9621 Acute and chronic respiratory failure with hypoxia: Secondary | ICD-10-CM | POA: Diagnosis present

## 2020-12-23 DIAGNOSIS — R0902 Hypoxemia: Secondary | ICD-10-CM | POA: Diagnosis not present

## 2020-12-23 DIAGNOSIS — E1169 Type 2 diabetes mellitus with other specified complication: Secondary | ICD-10-CM

## 2020-12-23 DIAGNOSIS — G4733 Obstructive sleep apnea (adult) (pediatric): Secondary | ICD-10-CM | POA: Diagnosis present

## 2020-12-23 DIAGNOSIS — I5033 Acute on chronic diastolic (congestive) heart failure: Secondary | ICD-10-CM | POA: Diagnosis not present

## 2020-12-23 DIAGNOSIS — I5032 Chronic diastolic (congestive) heart failure: Secondary | ICD-10-CM | POA: Diagnosis present

## 2020-12-23 DIAGNOSIS — R6889 Other general symptoms and signs: Secondary | ICD-10-CM | POA: Diagnosis not present

## 2020-12-23 DIAGNOSIS — E890 Postprocedural hypothyroidism: Secondary | ICD-10-CM | POA: Diagnosis present

## 2020-12-23 DIAGNOSIS — Z20822 Contact with and (suspected) exposure to covid-19: Secondary | ICD-10-CM | POA: Diagnosis not present

## 2020-12-23 DIAGNOSIS — Z743 Need for continuous supervision: Secondary | ICD-10-CM | POA: Diagnosis not present

## 2020-12-23 DIAGNOSIS — Z86718 Personal history of other venous thrombosis and embolism: Secondary | ICD-10-CM | POA: Diagnosis not present

## 2020-12-23 DIAGNOSIS — Z888 Allergy status to other drugs, medicaments and biological substances status: Secondary | ICD-10-CM

## 2020-12-23 DIAGNOSIS — Z6841 Body Mass Index (BMI) 40.0 and over, adult: Secondary | ICD-10-CM

## 2020-12-23 DIAGNOSIS — Z833 Family history of diabetes mellitus: Secondary | ICD-10-CM | POA: Diagnosis not present

## 2020-12-23 DIAGNOSIS — R000 Tachycardia, unspecified: Secondary | ICD-10-CM | POA: Diagnosis not present

## 2020-12-23 DIAGNOSIS — Z886 Allergy status to analgesic agent status: Secondary | ICD-10-CM

## 2020-12-23 DIAGNOSIS — R531 Weakness: Secondary | ICD-10-CM | POA: Diagnosis not present

## 2020-12-23 DIAGNOSIS — I517 Cardiomegaly: Secondary | ICD-10-CM | POA: Diagnosis not present

## 2020-12-23 LAB — BLOOD GAS, VENOUS
Acid-Base Excess: 7.9 mmol/L — ABNORMAL HIGH (ref 0.0–2.0)
Acid-Base Excess: 8.5 mmol/L — ABNORMAL HIGH (ref 0.0–2.0)
Bicarbonate: 35.9 mmol/L — ABNORMAL HIGH (ref 20.0–28.0)
Bicarbonate: 36.2 mmol/L — ABNORMAL HIGH (ref 20.0–28.0)
FIO2: 21
O2 Saturation: 93.9 %
O2 Saturation: 87.5 %
Patient temperature: 98.6
Patient temperature: 98.6
pCO2, Ven: 74.9 mmHg (ref 44.0–60.0)
pCO2, Ven: 73.6 mmHg (ref 44.0–60.0)
pH, Ven: 7.302 (ref 7.250–7.430)
pH, Ven: 7.312 (ref 7.250–7.430)
pO2, Ven: 79.1 mmHg — ABNORMAL HIGH (ref 32.0–45.0)
pO2, Ven: 60.3 mmHg — ABNORMAL HIGH (ref 32.0–45.0)

## 2020-12-23 LAB — CBC WITH DIFFERENTIAL/PLATELET
Abs Immature Granulocytes: 0.11 10*3/uL — ABNORMAL HIGH (ref 0.00–0.07)
Basophils Absolute: 0 10*3/uL (ref 0.0–0.1)
Basophils Relative: 0 %
Eosinophils Absolute: 0.3 10*3/uL (ref 0.0–0.5)
Eosinophils Relative: 2 %
HCT: 35.7 % — ABNORMAL LOW (ref 36.0–46.0)
Hemoglobin: 10.6 g/dL — ABNORMAL LOW (ref 12.0–15.0)
Immature Granulocytes: 1 %
Lymphocytes Relative: 19 %
Lymphs Abs: 2.6 10*3/uL (ref 0.7–4.0)
MCH: 29.2 pg (ref 26.0–34.0)
MCHC: 29.7 g/dL — ABNORMAL LOW (ref 30.0–36.0)
MCV: 98.3 fL (ref 80.0–100.0)
Monocytes Absolute: 0.9 10*3/uL (ref 0.1–1.0)
Monocytes Relative: 7 %
Neutro Abs: 9.8 10*3/uL — ABNORMAL HIGH (ref 1.7–7.7)
Neutrophils Relative %: 71 %
Platelets: 254 10*3/uL (ref 150–400)
RBC: 3.63 MIL/uL — ABNORMAL LOW (ref 3.87–5.11)
RDW: 14.3 % (ref 11.5–15.5)
WBC: 13.8 10*3/uL — ABNORMAL HIGH (ref 4.0–10.5)
nRBC: 0 % (ref 0.0–0.2)

## 2020-12-23 LAB — COMPREHENSIVE METABOLIC PANEL
ALT: 23 U/L (ref 0–44)
AST: 31 U/L (ref 15–41)
Albumin: 3.8 g/dL (ref 3.5–5.0)
Alkaline Phosphatase: 87 U/L (ref 38–126)
Anion gap: 11 (ref 5–15)
BUN: 21 mg/dL — ABNORMAL HIGH (ref 6–20)
CO2: 33 mmol/L — ABNORMAL HIGH (ref 22–32)
Calcium: 8.8 mg/dL — ABNORMAL LOW (ref 8.9–10.3)
Chloride: 95 mmol/L — ABNORMAL LOW (ref 98–111)
Creatinine, Ser: 1.03 mg/dL — ABNORMAL HIGH (ref 0.44–1.00)
GFR, Estimated: >60 mL/min (ref >60)
Glucose, Bld: 100 mg/dL — ABNORMAL HIGH (ref 70–99)
Potassium: 4.6 mmol/L (ref 3.5–5.1)
Sodium: 139 mmol/L (ref 135–145)
Total Bilirubin: 0.3 mg/dL (ref 0.3–1.2)
Total Protein: 7.3 g/dL (ref 6.5–8.1)

## 2020-12-23 LAB — RESP PANEL BY RT-PCR (FLU A&B, COVID) ARPGX2
Influenza A by PCR: NEGATIVE
Influenza B by PCR: NEGATIVE
SARS Coronavirus 2 by RT PCR: NEGATIVE

## 2020-12-23 LAB — TSH: TSH: 2.715 u[IU]/mL (ref 0.350–4.500)

## 2020-12-23 LAB — TROPONIN I (HIGH SENSITIVITY)
Troponin I (High Sensitivity): 4 ng/L (ref <18)
Troponin I (High Sensitivity): 4 ng/L (ref <18)

## 2020-12-23 LAB — GLUCOSE, CAPILLARY: Glucose-Capillary: 173 mg/dL — ABNORMAL HIGH (ref 70–99)

## 2020-12-23 LAB — MRSA NEXT GEN BY PCR, NASAL: MRSA by PCR Next Gen: NOT DETECTED

## 2020-12-23 LAB — MAGNESIUM: Magnesium: 2.4 mg/dL (ref 1.7–2.4)

## 2020-12-23 LAB — CBG MONITORING, ED: Glucose-Capillary: 94 mg/dL (ref 70–99)

## 2020-12-23 LAB — LACTIC ACID, PLASMA: Lactic Acid, Venous: 1.3 mmol/L (ref 0.5–1.9)

## 2020-12-23 LAB — BRAIN NATRIURETIC PEPTIDE: B Natriuretic Peptide: 31.3 pg/mL (ref 0.0–100.0)

## 2020-12-23 MED ORDER — TORSEMIDE 20 MG PO TABS: 40.0000 mg | ORAL_TABLET | Freq: Two times a day (BID) | ORAL | Status: DC

## 2020-12-23 MED ORDER — SODIUM CHLORIDE 0.9% FLUSH: 3.0000 mL | INTRAVENOUS | Status: DC | PRN

## 2020-12-23 MED ORDER — IPRATROPIUM-ALBUTEROL 0.5-2.5 (3) MG/3ML IN SOLN: 3.0000 mL | Freq: Four times a day (QID) | RESPIRATORY_TRACT | Status: DC

## 2020-12-23 MED ORDER — CHLORHEXIDINE GLUCONATE CLOTH 2 % EX PADS
6.0000 | MEDICATED_PAD | Freq: Every day | CUTANEOUS | Status: DC
  Administered 2020-12-23 – 2020-12-25 (×2): 6 via TOPICAL

## 2020-12-23 MED ORDER — IPRATROPIUM BROMIDE 0.02 % IN SOLN
1.0000 mg | Freq: Once | RESPIRATORY_TRACT | Status: AC
  Administered 2020-12-23: 1 mg via RESPIRATORY_TRACT
  Filled 2020-12-23: qty 5

## 2020-12-23 MED ORDER — IPRATROPIUM-ALBUTEROL 0.5-2.5 (3) MG/3ML IN SOLN: 3.0000 mL | RESPIRATORY_TRACT | Status: DC

## 2020-12-23 MED ORDER — ALBUTEROL SULFATE (2.5 MG/3ML) 0.083% IN NEBU: 2.5000 mg | INHALATION_SOLUTION | RESPIRATORY_TRACT | Status: DC | PRN

## 2020-12-23 MED ORDER — PANTOPRAZOLE SODIUM 40 MG PO TBEC
40.0000 mg | DELAYED_RELEASE_TABLET | Freq: Every day | ORAL | Status: DC
  Administered 2020-12-24 – 2020-12-25 (×2): 40 mg via ORAL
  Filled 2020-12-23 (×2): qty 1

## 2020-12-23 MED ORDER — LEVOTHYROXINE SODIUM 112 MCG PO TABS
224.0000 ug | ORAL_TABLET | Freq: Every day | ORAL | Status: DC
  Administered 2020-12-25: 224 ug via ORAL
  Filled 2020-12-23: qty 2

## 2020-12-23 MED ORDER — CEFTRIAXONE SODIUM 1 G IJ SOLR
1.0000 g | INTRAMUSCULAR | Status: DC
  Administered 2020-12-23: 1 g via INTRAVENOUS
  Filled 2020-12-23: qty 10

## 2020-12-23 MED ORDER — METHYLPREDNISOLONE SODIUM SUCC 125 MG IJ SOLR
80.0000 mg | Freq: Every day | INTRAMUSCULAR | Status: AC
  Administered 2020-12-23: 80 mg via INTRAVENOUS
  Filled 2020-12-23: qty 2

## 2020-12-23 MED ORDER — SODIUM CHLORIDE 0.9% FLUSH
3.0000 mL | Freq: Two times a day (BID) | INTRAVENOUS | Status: DC
  Administered 2020-12-23 – 2020-12-25 (×4): 3 mL via INTRAVENOUS

## 2020-12-23 MED ORDER — INSULIN ASPART 100 UNIT/ML IJ SOLN
0.0000 [IU] | INTRAMUSCULAR | Status: DC
  Administered 2020-12-24: 2 [IU] via SUBCUTANEOUS
  Administered 2020-12-24: 3 [IU] via SUBCUTANEOUS
  Administered 2020-12-24: 1 [IU] via SUBCUTANEOUS
  Administered 2020-12-24 (×3): 2 [IU] via SUBCUTANEOUS
  Administered 2020-12-24 – 2020-12-25 (×2): 1 [IU] via SUBCUTANEOUS

## 2020-12-23 MED ORDER — LACTATED RINGERS IV BOLUS
250.0000 mL | Freq: Once | INTRAVENOUS | Status: AC
  Administered 2020-12-23: 250 mL via INTRAVENOUS

## 2020-12-23 MED ORDER — PREDNISONE 20 MG PO TABS
40.0000 mg | ORAL_TABLET | Freq: Every day | ORAL | Status: DC
  Administered 2020-12-24: 40 mg via ORAL
  Filled 2020-12-23: qty 2

## 2020-12-23 MED ORDER — IPRATROPIUM-ALBUTEROL 0.5-2.5 (3) MG/3ML IN SOLN: 3.0000 mL | RESPIRATORY_TRACT | Status: DC | PRN

## 2020-12-23 MED ORDER — LACTATED RINGERS IV BOLUS: 500.0000 mL | Freq: Once | INTRAVENOUS | Status: DC

## 2020-12-23 MED ORDER — ALBUTEROL SULFATE (2.5 MG/3ML) 0.083% IN NEBU
2.5000 mg | INHALATION_SOLUTION | Freq: Three times a day (TID) | RESPIRATORY_TRACT | Status: DC
  Administered 2020-12-23 – 2020-12-25 (×5): 2.5 mg via RESPIRATORY_TRACT
  Filled 2020-12-23 (×5): qty 3

## 2020-12-23 MED ORDER — ROSUVASTATIN CALCIUM 10 MG PO TABS
10.0000 mg | ORAL_TABLET | Freq: Every evening | ORAL | Status: DC
  Administered 2020-12-24: 10 mg via ORAL
  Filled 2020-12-23: qty 1

## 2020-12-23 MED ORDER — ACETAMINOPHEN 325 MG PO TABS
650.0000 mg | ORAL_TABLET | Freq: Four times a day (QID) | ORAL | Status: DC | PRN
  Filled 2020-12-23: qty 2

## 2020-12-23 MED ORDER — ALLOPURINOL 100 MG PO TABS
100.0000 mg | ORAL_TABLET | Freq: Every day | ORAL | Status: DC
  Administered 2020-12-25: 100 mg via ORAL
  Filled 2020-12-23: qty 1

## 2020-12-23 MED ORDER — ENOXAPARIN SODIUM 40 MG/0.4ML IJ SOSY: 40.0000 mg | PREFILLED_SYRINGE | Freq: Every day | INTRAMUSCULAR | Status: DC

## 2020-12-23 MED ORDER — ALBUTEROL (5 MG/ML) CONTINUOUS INHALATION SOLN
10.0000 mg/h | INHALATION_SOLUTION | RESPIRATORY_TRACT | Status: DC
  Administered 2020-12-23: 10 mg/h via RESPIRATORY_TRACT
  Filled 2020-12-23: qty 20

## 2020-12-23 MED ORDER — SODIUM CHLORIDE 0.9 % IV SOLN: 250.0000 mL | INTRAVENOUS | Status: DC | PRN

## 2020-12-23 MED ORDER — METHYLPREDNISOLONE SODIUM SUCC 125 MG IJ SOLR
125.0000 mg | Freq: Once | INTRAMUSCULAR | Status: AC
  Administered 2020-12-23: 125 mg via INTRAVENOUS
  Filled 2020-12-23: qty 2

## 2020-12-23 MED ORDER — UMECLIDINIUM-VILANTEROL 62.5-25 MCG/INH IN AEPB
1.0000 | INHALATION_SPRAY | Freq: Every day | RESPIRATORY_TRACT | Status: DC
  Administered 2020-12-24 – 2020-12-25 (×2): 1 via RESPIRATORY_TRACT
  Filled 2020-12-23: qty 14

## 2020-12-23 MED ORDER — IPRATROPIUM-ALBUTEROL 0.5-2.5 (3) MG/3ML IN SOLN: 3.0000 mL | Freq: Four times a day (QID) | RESPIRATORY_TRACT | Status: DC | PRN

## 2020-12-23 MED ORDER — ACETAMINOPHEN 650 MG RE SUPP: 650.0000 mg | Freq: Four times a day (QID) | RECTAL | Status: DC | PRN

## 2020-12-23 NOTE — ED Provider Notes (Signed)
[facility] DEPT Provider Note   CSN: [id number] Arrival date & time: 12/23/20  1453     History Chief Complaint  Patient presents with   Weakness    generalized    Michele Owens is a 60 y.o. female.  HPI Patient presents for weakness.  History notable for CHF, COPD with home O2 use at night, DM, depression, HTN, and remote history of DVT.  Per chart review, last seen in the ED 2 weeks ago for tachycardia (SVT).  At the time, she converted to NSR following 6 mg dose of adenosine.  Magnesium was replaced.  She is on Coreg.  She was advised to follow-up with cardiology.  Follow-up appointment yet to take place.  Today, she endorses generalized weakness that has worsened over the past week.  She has been eating less.  She has not been checking her blood sugar because her glucometer has been not working.  She endorses gouty pain in her feet.  She denies any other areas of discomfort.  She typically walks with a cane and she has been ambulatory today.  She endorses increased bilateral leg swelling.  She does state that she has been taking her home medications, including torsemide.  She continues to take Klonopin 3 times daily.  She states that her shortness of breath is 'off and on'.    Past Medical History:  Diagnosis Date   Anxiety    Arthritis    Asthma    Chronic diastolic CHF (congestive heart failure) (HCC)    COPD (chronic obstructive pulmonary disease) (HCC)    Uses Oxygen at night   Depression    Diabetes mellitus without complication (HCC)    Dyspnea    occ   Family history of thyroid cancer    GERD (gastroesophageal reflux disease)    Gout    Headache    migraines   History of DVT of lower extremity    History of nuclear stress test    Myoview 2/17:  Low risk stress nuclear study with a small, moderate intensity, partially reversible inferior lateral defect consistent with small prior infarct and minimal peri-infarct ischemia; EF 68 with  normal wall motion   Hypertension    Pneumonia    Thyroid cancer ([place]) 09/23/2016    Patient Active Problem List   Diagnosis Date Noted   Respiratory failure with hypercapnia ([place]) 12/23/2020   Anemia 12/23/2020   Acute on chronic respiratory failure with hypoxia and hypercapnia ([place]) 04/18/2020   Acute on chronic respiratory failure with hypoxia ([place]) 04/18/2020   Hemoptysis 50/12/3816   Acute metabolic encephalopathy 29/93/7169   Acute encephalopathy 04/18/2020   Chronic diastolic CHF (congestive heart failure) ([place]) 04/18/2020   Sepsis ([place]) 04/18/2020   Morbid obesity with BMI of 50.0-59.9, adult ([place])    Fall at home, initial encounter 06/23/2018   HNP (herniated nucleus pulposus), thoracic 06/23/2018   CHF (congestive heart failure) ([place]) 06/18/2018   Hypertensive disorder 08/28/2017   Obesity 08/28/2017   COPD exacerbation ([place]) 08/04/2017   Acute and chronic respiratory failure with hypercapnia ([place])    Pulmonary HTN ([place])    Genetic testing 02/27/2017   Family history of thyroid cancer    Hypomagnesemia 10/03/2016   Atypical chest pain    HLD (hyperlipidemia) 09/23/2016   Gout 09/23/2016   DVT (deep vein thrombosis) in pregnancy 09/23/2016   Thyroid cancer ([place]) 09/23/2016   Hypocalcemia 09/23/2016   AKI (acute kidney injury) ([place]) 09/23/2016   Acute pulmonary edema ([place])  Acute hypoxemic respiratory failure (HCC)    Ventilator dependent (HCC)    Chronic respiratory failure with hypoxia and hypercapnia ([place]) 07/16/2016   CAP (community acquired pneumonia) 01/23/2016   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 01/23/2016   Pulmonary hypertension ([place]) 01/23/2016   Obesity hypoventilation syndrome ([place]) 08/26/2015   Dyslipidemia associated with type 2 diabetes mellitus ([place]) 04/24/2015   Leukocytosis 04/24/2015   Controlled type 2 diabetes mellitus without complication, without long-term current use of insulin ([place]) 04/24/2015   Anxiety 04/24/2015    Benign essential HTN 04/24/2015   Normocytic anemia 04/24/2015   OSA (obstructive sleep apnea) 05/10/2012   Tobacco abuse 07/04/2011    Past Surgical History:  Procedure Laterality Date   BUNIONECTOMY Bilateral    CARDIAC CATHETERIZATION N/A 06/23/2015   Procedure: Right/Left Heart Cath and Coronary Angiography;  Surgeon: [name], MD;  Location: [facility];  Service: Cardiovascular;  Laterality: N/A;   COLONOSCOPY WITH PROPOFOL N/A 12/15/2015   Procedure: COLONOSCOPY WITH PROPOFOL;  Surgeon: [name], MD;  Location: [facility];  Service: Endoscopy;  Laterality: N/A;   RIGHT HEART CATH N/A 10/01/2019   Procedure: RIGHT HEART CATH;  Surgeon: [name], MD;  Location: [facility];  Service: Cardiovascular;  Laterality: N/A;   THYROIDECTOMY  09/19/2016   THYROIDECTOMY N/A 09/19/2016   Procedure: TOTAL THYROIDECTOMY;  Surgeon: [name], MD;  Location: [facility];  Service: General;  Laterality: N/A;   TONSILLECTOMY     TOTAL ABDOMINAL HYSTERECTOMY  07/14/10     OB History   No obstetric history on file.     Family History  Problem Relation Age of Onset   Cancer Father        thought to be due to exposure to concrete   Diabetes Mother    Thyroid cancer Mother        dx in her 72s-60s   Cancer Maternal Uncle        2 uncles with cancer NOS   Brain cancer Paternal Aunt    Cancer Cousin        maternal first cousin - NOS   Cancer Cousin        maternal first cousin - NOS    Social History   Tobacco Use   Smoking status: Former    Packs/day: 0.50    Years: 41.00    Pack years: 20.50    Types: Cigarettes    Quit date: 07/2016    Years since quitting: 4.4   Smokeless tobacco: Never   Tobacco comments:    Starting to [employer] -- using Nicotine Patch  Vaping Use   Vaping Use: Never used  Substance Use Topics   Alcohol use: Not Currently    Alcohol/week: 0.0 standard drinks    Comment: ? none now   Drug use: Never    Comment: none for long  time    Home Medications Prior to Admission medications   Medication Sig Start Date End Date Taking? Authorizing Provider  albuterol (PROVENTIL HFA;VENTOLIN HFA) 108 (90 Base) MCG/ACT inhaler Inhale 1-2 puffs into the lungs every 6 (six) hours as needed for wheezing or shortness of breath. 04/23/18   Long, [name], MD  allopurinol (ZYLOPRIM) 100 MG tablet Take 100 mg by mouth daily.    [provider]  benzonatate (TESSALON) 200 MG capsule Take 1 capsule (200 mg total) by mouth in the morning, at noon, and at bedtime. 7 DS 04/13/20   Regalado, [employer]  A, MD  Blood Glucose Monitoring Suppl (ACCU-CHEK AVIVA PLUS) w/Device KIT 1 each by Other route daily.  08/07/17   [provider]  butalbital-acetaminophen-caffeine (FIORICET) 50-325-40 MG tablet Take 1 tablet by mouth 2 (two) times daily as needed for migraine. 08/13/20   [provider]  calcitRIOL (ROCALTROL) 0.5 MCG capsule Take 1 capsule (0.5 mcg total) by mouth daily. 09/26/16   Hongalgi, [name], MD  carvedilol (COREG) 6.25 MG tablet Take 1 tablet (6.25 mg total) by mouth 2 (two) times daily with a meal. 01/23/18   Bensimhon, [name], MD  clonazePAM (KLONOPIN) 2 MG tablet Take 1 tablet (2 mg total) by mouth 3 (three) times daily. for anxiety Patient taking differently: Take 2 mg by mouth 3 (three) times daily. 06/23/19   [name], MD  cyclobenzaprine (FLEXERIL) 10 MG tablet Take 10 mg by mouth 3 (three) times daily. 11/19/19   [provider]  diclofenac sodium (VOLTAREN) 1 % GEL Apply 2 g topically 4 (four) times daily as needed (for pain). Patient not taking: No sig reported 06/19/18   [name], MD  EPINEPHrine 0.3 mg/0.3 mL IJ SOAJ injection Inject 0.3 mg into the muscle once as needed for anaphylaxis.    [provider]  FEROSUL 325 (65 Fe) MG tablet Take 325 mg by mouth daily. 03/22/17   [provider]  glucose blood test strip Use as instructed to check blood sugar up to 4  times daily Patient taking differently: 1 each by Other route See admin instructions. Use as instructed to check blood sugar up to 4 times daily 08/06/17   [name], DO  guaiFENesin (MUCINEX) 600 MG 12 hr tablet Take 2 tablets (1,200 mg total) by mouth 2 (two) times daily. 04/13/20   Regalado, Belkys A, MD  ipratropium-albuterol (DUONEB) 0.5-2.5 (3) MG/3ML SOLN Take 3 mLs by nebulization 4 (four) times daily.  04/05/17   [provider]  levothyroxine (SYNTHROID, LEVOTHROID) 112 MCG tablet Take 224 mcg by mouth daily before breakfast. 07/11/17   [provider]  lisinopril (PRINIVIL,ZESTRIL) 5 MG tablet Take 1 tablet (5 mg total) by mouth daily. Please call for office visit [phone] Patient taking differently: Take 5 mg by mouth daily. 08/15/17   [name], MD  loperamide (IMODIUM) 2 MG capsule Take 1 capsule (2 mg total) by mouth 4 (four) times daily as needed for diarrhea or loose stools. 06/03/80   [name], MD  metFORMIN (GLUCOPHAGE) 500 MG tablet Take 1 tablet (500 mg total) by mouth 2 (two) times daily with a meal. 12/11/14   [name], MD  nitroGLYCERIN (NITROSTAT) 0.4 MG SL tablet Place 1 tablet (0.4 mg total) under the tongue every 5 (five) minutes as needed for chest pain. 06/19/18   [name], MD  nystatin cream (MYCOSTATIN) Apply 1 application topically 2 (two) times daily as needed for dry skin. 04/13/20   Regalado, Belkys A, MD  nystatin ointment (MYCOSTATIN) Apply 1 application topically 2 (two) times daily. 09/10/20   [provider]  omeprazole (PRILOSEC) 20 MG capsule Take 20 mg by mouth 2 (two) times daily before a meal. 11/19/19   [provider]  oxyCODONE-acetaminophen (PERCOCET) 10-325 MG tablet Take 1 tablet by mouth every 6 (six) hours as needed for pain. 12/03/19   [provider]  OXYGEN Inhale 2-3 L into the lungs continuous.    [provider]  oxymetazoline (AFRIN NASAL SPRAY) 0.05 % nasal spray  Place 1 spray  into both nostrils 2 (two) times daily. 10/04/20   Pearson Grippe, DO  polyethylene glycol (MIRALAX / GLYCOLAX) 17 g packet Take 17 g by mouth daily as needed for mild constipation.    [provider]  potassium chloride SA (K-DUR,KLOR-CON) 20 MEQ tablet Take 1 tablet (20 mEq total) by mouth 2 (two) times daily. 07/24/18   [name], MD  predniSONE (DELTASONE) 20 MG tablet Take 2 tablets (40 mg total) by mouth daily. 11/11/20   Horton, Barbette Hair, MD  rosuvastatin (CRESTOR) 10 MG tablet Take 10 mg by mouth every evening. 11/19/19   [provider]  senna-docusate (SENNA-PLUS) 8.6-50 MG tablet Take 1 tablet by mouth daily.    [provider]  torsemide (DEMADEX) 20 MG tablet Take 2 tablets (40 mg total) by mouth 2 (two) times daily. 12/17/20   [name], MD  traZODone (DESYREL) 50 MG tablet Take 100 mg by mouth at bedtime. 11/19/19   [provider]  triamcinolone cream (KENALOG) 0.1 % Apply 1 application topically 2 (two) times daily. 09/10/20   [provider]  umeclidinium-vilanterol (ANORO ELLIPTA) 62.5-25 MCG/INH AEPB Inhale 1 puff into the lungs daily.    [provider]    Allergies    Bee venom, Fioricet [butalbital-apap-caffeine], Ibuprofen, Lamisil [terbinafine], and Nsaids  Review of Systems   Review of Systems  Constitutional:  Positive for appetite change and fatigue. Negative for chills and fever.  HENT:  Negative for ear pain and sore throat.   Eyes:  Negative for pain and visual disturbance.  Respiratory:  Negative for cough, choking, chest tightness and shortness of breath.   Cardiovascular:  Positive for leg swelling. Negative for chest pain and palpitations.  Gastrointestinal:  Negative for abdominal pain, diarrhea, nausea and vomiting.  Genitourinary:  Negative for dysuria, flank pain, hematuria and pelvic pain.  Musculoskeletal:  Positive for arthralgias. Negative for back pain, gait problem,  myalgias and neck pain.  Skin:  Negative for color change and rash.  Neurological:  Positive for weakness (Generalized). Negative for dizziness, seizures, syncope, light-headedness and numbness.  All other systems reviewed and are negative.  Physical Exam Updated Vital Signs BP (!) 160/72 (BP Location: Right Arm)   Pulse 87   Temp 98.4 F (36.9 C) (Oral)   Resp (!) 22   Ht 5' 5" (1.651 m)   Wt (!) 142 kg   SpO2 99%   BMI 52.09 kg/m   Physical Exam Vitals and nursing note reviewed.  Constitutional:      General: She is not in acute distress.    Appearance: She is well-developed. She is obese. She is not ill-appearing, toxic-appearing or diaphoretic.  HENT:     Head: Normocephalic and atraumatic.     Right Ear: External ear normal.     Left Ear: External ear normal.     Nose: Nose normal.     Mouth/Throat:     Mouth: Mucous membranes are moist.     Pharynx: Oropharynx is clear.  Eyes:     Extraocular Movements: Extraocular movements intact.     Conjunctiva/sclera: Conjunctivae normal.  Cardiovascular:     Rate and Rhythm: Normal rate and regular rhythm.     Heart sounds: No murmur heard. Pulmonary:     Effort: Pulmonary effort is normal. No respiratory distress.     Breath sounds: Decreased breath sounds present. No wheezing or rales.  Abdominal:     Palpations: Abdomen is soft.     Tenderness: There  is no abdominal tenderness. There is no guarding.  Musculoskeletal:        General: No tenderness or signs of injury.     Cervical back: Neck supple.     Right lower leg: Edema present.     Left lower leg: Edema present.  Skin:    General: Skin is warm and dry.     Coloration: Skin is not jaundiced or pale.  Neurological:     General: No focal deficit present.     Mental Status: She is alert and oriented to person, place, and time.     Cranial Nerves: Cranial nerves are intact. No cranial nerve deficit, dysarthria or facial asymmetry.     Sensory: Sensation is intact.  No sensory deficit.     Motor: Motor function is intact. No weakness, abnormal muscle tone or pronator drift.     Coordination: Coordination is intact. Coordination normal. Finger-Nose-Finger Test normal.  Psychiatric:        Mood and Affect: Mood normal.        Behavior: Behavior normal.        Thought Content: Thought content normal.        Judgment: Judgment normal.    ED Results / Procedures / Treatments   Labs (all labs ordered are listed, but only abnormal results are displayed) Labs Reviewed  COMPREHENSIVE METABOLIC PANEL - Abnormal; Notable for the following components:      Result Value   Chloride 95 (*)    CO2 33 (*)    Glucose, Bld 100 (*)    BUN 21 (*)    Creatinine, Ser 1.03 (*)    Calcium 8.8 (*)    All other components within normal limits  CBC WITH DIFFERENTIAL/PLATELET - Abnormal; Notable for the following components:   WBC 13.8 (*)    RBC 3.63 (*)    Hemoglobin 10.6 (*)    HCT 35.7 (*)    MCHC 29.7 (*)    Neutro Abs 9.8 (*)    Abs Immature Granulocytes 0.11 (*)    All other components within normal limits  BLOOD GAS, VENOUS - Abnormal; Notable for the following components:   pCO2, Ven 74.9 (*)    pO2, Ven 79.1 (*)    Bicarbonate 35.9 (*)    Acid-Base Excess 7.9 (*)    All other components within normal limits  RAPID URINE DRUG SCREEN, HOSP PERFORMED - Abnormal; Notable for the following components:   Benzodiazepines POSITIVE (*)    Barbiturates POSITIVE (*)    All other components within normal limits  BLOOD GAS, VENOUS - Abnormal; Notable for the following components:   pCO2, Ven 73.6 (*)    pO2, Ven 60.3 (*)    Bicarbonate 36.2 (*)    Acid-Base Excess 8.5 (*)    All other components within normal limits  HEMOGLOBIN A1C - Abnormal; Notable for the following components:   Hgb A1c MFr Bld 6.2 (*)    All other components within normal limits  CBC WITH DIFFERENTIAL/PLATELET - Abnormal; Notable for the following components:   WBC 13.4 (*)     Hemoglobin 11.4 (*)    MCHC 29.9 (*)    Neutro Abs 12.5 (*)    Abs Immature Granulocytes 0.14 (*)    All other components within normal limits  COMPREHENSIVE METABOLIC PANEL - Abnormal; Notable for the following components:   Chloride 96 (*)    Glucose, Bld 176 (*)    All other components within normal limits  BLOOD GAS, ARTERIAL -  Abnormal; Notable for the following components:   pCO2 arterial 62.6 (*)    pO2, Arterial 76.7 (*)    Bicarbonate 33.7 (*)    Acid-Base Excess 6.9 (*)    All other components within normal limits  GLUCOSE, CAPILLARY - Abnormal; Notable for the following components:   Glucose-Capillary 173 (*)    All other components within normal limits  GLUCOSE, CAPILLARY - Abnormal; Notable for the following components:   Glucose-Capillary 172 (*)    All other components within normal limits  GLUCOSE, CAPILLARY - Abnormal; Notable for the following components:   Glucose-Capillary 162 (*)    All other components within normal limits  GLUCOSE, CAPILLARY - Abnormal; Notable for the following components:   Glucose-Capillary 123 (*)    All other components within normal limits  RESP PANEL BY RT-PCR (FLU A&B, COVID) ARPGX2  MRSA NEXT GEN BY PCR, NASAL  EXPECTORATED SPUTUM ASSESSMENT W GRAM STAIN, RFLX TO RESP C  URINALYSIS, COMPLETE (UACMP) WITH MICROSCOPIC  MAGNESIUM  LACTIC ACID, PLASMA  BRAIN NATRIURETIC PEPTIDE  TSH  VITAMIN B12  FOLATE  IRON AND TIBC  FERRITIN  RETICULOCYTES  MAGNESIUM  PHOSPHORUS  TSH  CBG MONITORING, ED  TROPONIN I (HIGH SENSITIVITY)  TROPONIN I (HIGH SENSITIVITY)    EKG EKG Interpretation  Date/Time:  Thursday December 23 2020 15:12:10 EDT Ventricular Rate:  83 PR Interval:  159 QRS Duration: 77 QT Interval:  378 QTC Calculation: 445 R Axis:   58 Text Interpretation: Sinus rhythm Confirmed by Elnora Morrison [phone]) on 12/24/2020 8:51:37 AM  Radiology DG Chest Port 1 View  Result Date: 12/23/2020 CLINICAL DATA:  Weakness.  EXAM: PORTABLE CHEST 1 VIEW COMPARISON:  December 09, 2020. FINDINGS: Stable cardiomegaly. Mild bibasilar subsegmental atelectasis or infiltrates are noted. Bony thorax is unremarkable. IMPRESSION: Mild bibasilar subsegmental atelectasis or infiltrates. Electronically Signed   By: Marijo Conception M.D.   On: 12/23/2020 17:56    Procedures Procedures   Medications Ordered in ED Medications  allopurinol (ZYLOPRIM) tablet 100 mg (100 mg Oral Not Given 12/24/20 1134)  levothyroxine (SYNTHROID) tablet 224 mcg (224 mcg Oral Not Given 12/24/20 0957)  pantoprazole (PROTONIX) EC tablet 40 mg (40 mg Oral Given 12/24/20 0926)  rosuvastatin (CRESTOR) tablet 10 mg (0 mg Oral Hold 12/23/20 2318)  acetaminophen (TYLENOL) tablet 650 mg (has no administration in time range)    Or  acetaminophen (TYLENOL) suppository 650 mg (has no administration in time range)  insulin aspart (novoLOG) injection 0-9 Units (1 Units Subcutaneous Given 12/24/20 1257)  methylPREDNISolone sodium succinate (SOLU-MEDROL) 125 mg/2 mL injection 80 mg (80 mg Intravenous Given 12/23/20 2243)    Followed by  predniSONE (DELTASONE) tablet 40 mg (has no administration in time range)  sodium chloride flush (NS) 0.9 % injection 3 mL (3 mLs Intravenous Given 12/24/20 0957)  sodium chloride flush (NS) 0.9 % injection 3 mL (has no administration in time range)  0.9 %  sodium chloride infusion (has no administration in time range)  Chlorhexidine Gluconate Cloth 2 % PADS 6 each (6 each Topical Given 12/23/20 2148)  umeclidinium-vilanterol (ANORO ELLIPTA) 62.5-25 MCG/INH 1 puff (1 puff Inhalation Given 12/24/20 0824)  albuterol (PROVENTIL) (2.5 MG/3ML) 0.083% nebulizer solution 2.5 mg (2.5 mg Nebulization Given 12/24/20 0823)  ipratropium-albuterol (DUONEB) 0.5-2.5 (3) MG/3ML nebulizer solution 3 mL (has no administration in time range)  hydrALAZINE (APRESOLINE) injection 10 mg (10 mg Intravenous Given 12/24/20 0228)  furosemide (LASIX) injection 80 mg (80  mg Intravenous Given 12/24/20 0956)  clonazePAM (  KLONOPIN) tablet 2 mg (2 mg Oral Given 12/24/20 0957)  cefTRIAXone (ROCEPHIN) 2 g in sodium chloride 0.9 % 100 mL IVPB (has no administration in time range)  enoxaparin (LOVENOX) injection 70 mg (has no administration in time range)  methylPREDNISolone sodium succinate (SOLU-MEDROL) 125 mg/2 mL injection 125 mg (125 mg Intravenous Given 12/23/20 1746)  ipratropium (ATROVENT) nebulizer solution 1 mg (1 mg Nebulization Given 12/23/20 1710)  lactated ringers bolus 250 mL (0 mLs Intravenous Stopped 12/23/20 1920)  morphine 2 MG/ML injection 2 mg (2 mg Intravenous Given 12/24/20 0448)  clonazePAM (KLONOPIN) tablet 1 mg (1 mg Oral Given 12/24/20 1740)    ED Course  I have reviewed the triage vital signs and the nursing notes.  Pertinent labs & imaging results that were available during my care of the patient were reviewed by me and considered in my medical decision making (see chart for details).    MDM Rules/Calculators/A&P                         CRITICAL CARE Performed by: Godfrey Pick   Total critical care time: 35 minutes  Critical care time was exclusive of separately billable procedures and treating other patients.  Critical care was necessary to treat or prevent imminent or life-threatening deterioration.  Critical care was time spent personally by me on the following activities: development of treatment plan with patient and/or surrogate as well as nursing, discussions with consultants, evaluation of patient's response to treatment, examination of patient, obtaining history from patient or surrogate, ordering and performing treatments and interventions, ordering and review of laboratory studies, ordering and review of radiographic studies, pulse oximetry and re-evaluation of patient's condition.   Patient is a 60 year old female with history of obesity, CHF, COPD, presenting for 1 week of worsening generalized weakness.  She is alert and  oriented on exam.  Initial vital signs are reassuring.  She has no focal neurologic deficits.  She does have pitting edema in bilateral lower extremities.  She has diminished breath sounds although lung auscultation difficult due to body habitus.  Work-up was initiated.  EKG without evidence of ischemia.  Chest x-ray notable for bibasilar atelectasis.  CBC shows baseline anemia with a leukocytosis which has also been present on recent lab work.  Creatinine slightly above baseline.  Bicarbonate at chronically elevated level.  Blood gas shows respiratory acidosis: 7.302/74.9/79.1/35.9.  COPD treatment was given to optimize ventilation.  Following this treatment, there was only minimal improvement in hypercarbia.  BiPAP was initiated.  Pickwickian syndrome is likely greatest contributing factor.  Patient was admitted to hospitalist for further management.  Final Clinical Impression(s) / ED Diagnoses Final diagnoses:  Acute on chronic respiratory failure with hypercapnia [facility])    Rx / DC Orders ED Discharge Orders     None        Godfrey Pick, MD 12/24/20 1331

## 2020-12-23 NOTE — Progress Notes (Signed)
Patient was asleep and slurring her words when I re-entered her room. As I was putting her purse away I noticed that she has a bottle of clonazepam in her purse. Unknown if she took this medication, or how many she took. Meds are no longer within reach but it is my understanding we are not able to confiscate home meds. Will speak to my charge nurse and follow facility protocol.

## 2020-12-23 NOTE — ED Notes (Signed)
Report called to Marzetta Board, RN in ICU. Accepted pt to be brought up to floor.

## 2020-12-23 NOTE — Patient Outreach (Signed)
Care Coordination  12/23/2020  Michele Owens 09-14-1960 [id number]  RNCM returned patient's phone call-no answer, left message.  [name] RN, BSN   [facility] Managed [employer] High Risk [id number]

## 2020-12-23 NOTE — ED Triage Notes (Signed)
Pt to ED via EMS from home c/o generalized weakness over the past 5 days progressively getting worse. HX CHF, DM, Gout.  Bilat LE edema, per pt swelling is worse than normal. Normal home o2 user '@4L'$ . #20 L forearm. No medications given by EMS. Last VS:127/83, P 83, 96% [place] 3L, Temp 98

## 2020-12-23 NOTE — H&P (Signed)
[name] DCV:[id number] DOB: [birth year] DOA: 12/23/2020   PCP: [name], MD      Patient arrived to ER on 12/23/20 at 1453 Referred by Attending Godfrey Pick, MD   Patient coming from: home Lives alone,    Chief Complaint:   Chief Complaint  Patient presents with   Weakness    generalized    HPI: Keith Cancio is a 60 y.o. female with medical history significant of morbid obesity, COPD on 4Lat baseline, chronic diastolic heart failure, hypertension, chronic pain, hypothyroidism, gout    Presented with   fatigue cough productive of dark sputum Patient was stating she was feeling extra sleepy Had recurrent admissions home COPD exacerbations requiring intubation in the past. Patient initially lethargic difficult to obtain history once she is more awake with BiPAP reports she has been taking all her medications she does no longer smoke does not drink alcohol.  Did not have any fevers or chills. Recently was tested positive for COVID 10 November 2020 and finish her steroids Patient has clonazepam on her medication list. Has  been vaccinated against COVID    Initial COVID TEST in house  PCR testing  Pending  Lab Results  Component Value Date   [place] (A) 11/10/2020   [place] NEGATIVE 10/01/2020   [place] NEGATIVE 04/18/2020   [place] NEGATIVE 04/10/2020     Regarding pertinent Chronic problems:     Hyperlipidemia - on statins Crestor Lipid Panel     Component Value Date/Time   CHOL 226 (H) 08/05/2017 0318   TRIG 59 08/05/2017 0318   HDL 80 08/05/2017 0318   CHOLHDL 2.8 08/05/2017 0318   VLDL 12 08/05/2017 0318   LDLCALC 134 (H) 08/05/2017 0318     HTN on Coreg lisinopril    chronic CHF diastolic/systolic/ combined - last echo May 2021 Torsemide    DM 2 -  Lab Results  Component Value Date   HGBA1C 6.2 (H) 04/19/2020   on  PO meds only,    Hypothyroidism:  Lab Results  Component Value Date   TSH 2.715 12/23/2020   on  synthroid    Morbid obesity-   BMI Readings from Last 1 Encounters:  12/23/20 44.93 kg/m     COPD -  on baseline oxygen 4L,      OSA -on nocturnal  CPAP,     Chronic anemia - baseline hg Hemoglobin & Hematocrit  Recent Labs    11/11/20 0000 12/09/20 2110 12/23/20 1533  HGB 11.0* 12.1 10.6*    While in ER:  VBg showing sign of hypercapnea  Started on Bipap in ER Nebulizzer given  Patient became more arousable after being on BiPAP able to answer questions back to baseline  ED Triage Vitals  Enc Vitals Group     BP 12/23/20 1506 110/63     Pulse Rate 12/23/20 1506 82     Resp 12/23/20 1506 16     Temp 12/23/20 1506 97.9 F (36.6 C)     Temp Source 12/23/20 1506 Oral     SpO2 12/23/20 1506 98 %     Weight 12/23/20 1505 270 lb (122.5 kg)     Height 12/23/20 1505 _0  (1.651 m)     Head Circumference --      Peak Flow --      Pain Score 12/23/20 1505 10     Pain Loc --      Pain Edu? --      Excl. in [place]? --  GNFA(21)@     _________________________________________ Significant initial  Findings: Abnormal Labs Reviewed  COMPREHENSIVE METABOLIC PANEL - Abnormal; Notable for the following components:      Result Value   Chloride 95 (*)    CO2 33 (*)    Glucose, Bld 100 (*)    BUN 21 (*)    Creatinine, Ser 1.03 (*)    Calcium 8.8 (*)    All other components within normal limits  CBC WITH DIFFERENTIAL/PLATELET - Abnormal; Notable for the following components:   WBC 13.8 (*)    RBC 3.63 (*)    Hemoglobin 10.6 (*)    HCT 35.7 (*)    MCHC 29.7 (*)    Neutro Abs 9.8 (*)    Abs Immature Granulocytes 0.11 (*)    All other components within normal limits  BLOOD GAS, VENOUS - Abnormal; Notable for the following components:   pCO2, Ven 74.9 (*)    pO2, Ven 79.1 (*)    Bicarbonate 35.9 (*)    Acid-Base Excess 7.9 (*)    All other components within normal limits  BLOOD GAS, VENOUS - Abnormal; Notable for the following components:   pCO2, Ven 73.6 (*)    pO2, Ven 60.3  (*)    Bicarbonate 36.2 (*)    Acid-Base Excess 8.5 (*)    All other components within normal limits   ____________________________________________ Ordered  CXR -  NON acute atelectasis   _________________________ Troponin 4  ECG: Ordered Personally reviewed by me showing: HR : 83 Rhythm:  NSR,    no evidence of ischemic changes QTC 445 ___________   The recent clinical data is shown below. Vitals:   12/23/20 1930 12/23/20 1953 12/23/20 2000 12/23/20 2009  BP: 125/69  124/60 124/60  Pulse: 76 77 75 75  Resp: _0 Temp:      TempSrc:      SpO2: 98% 99% 99% 99%  Weight:      Height:        WBC      Component Value Date/Time   WBC 13.8 (H) 12/23/2020 1533   LYMPHSABS 2.6 12/23/2020 1533   MONOABS 0.9 12/23/2020 1533   EOSABS 0.3 12/23/2020 1533   BASOSABS 0.0 12/23/2020 1533    Lactic Acid, Venous    Component Value Date/Time   LATICACIDVEN 1.3 12/23/2020 1542        _______________________________________________ Hospitalist was called for admission for acute on chronic respiratory failure with hypercarbia secondary to COPD exacerbation  The following Work up has been ordered so far:  Orders Placed This Encounter  Procedures   Resp Panel by RT-PCR (Flu A&B, Covid) Nasopharyngeal Swab   DG Chest Port 1 View   Comprehensive metabolic panel   CBC WITH DIFFERENTIAL   Urinalysis, Complete w Microscopic   Magnesium   Lactic acid, plasma   Blood gas, venous   Urine rapid drug screen (hosp performed)   Brain natriuretic peptide   TSH   Blood gas, venous (at [facility] and [facility], not at [facility])   Cardiac monitoring   Initiate Carrier Fluid Protocol   Consult to hospitalist   Airborne and Contact precautions   Pulse oximetry, continuous   Bipap   CBG monitoring, ED   ED EKG   Insert peripheral IV     Following Medications were ordered in ER: Medications  albuterol (PROVENTIL,VENTOLIN) solution continuous neb (10 mg/hr Nebulization New Bag/Given 12/23/20  1710)  methylPREDNISolone sodium succinate (SOLU-MEDROL) 125 mg/2 mL injection 125 mg (  125 mg Intravenous Given 12/23/20 1746)  ipratropium (ATROVENT) nebulizer solution 1 mg (1 mg Nebulization Given 12/23/20 1710)  lactated ringers bolus 250 mL (0 mLs Intravenous Stopped 12/23/20 1920)        Consult Orders  (From admission, onward)           Start     Ordered   12/23/20 1957  Consult to hospitalist  Once       Provider:  (Not yet assigned)  Question Answer Comment  Place call to: [facility]   Reason for Consult Admit      12/23/20 1956            OTHER Significant initial  Findings:  labs showing:    Recent Labs  Lab 12/23/20 1533  NA 139  K 4.6  CO2 33*  GLUCOSE 100*  BUN 21*  CREATININE 1.03*  CALCIUM 8.8*  MG 2.4    Cr     Up from baseline see below Lab Results  Component Value Date   CREATININE 1.03 (H) 12/23/2020   CREATININE 0.80 12/09/2020   CREATININE 0.61 11/11/2020    Recent Labs  Lab 12/23/20 1533  AST 31  ALT 23  ALKPHOS 87  BILITOT 0.3  PROT 7.3  ALBUMIN 3.8   Lab Results  Component Value Date   CALCIUM 8.8 (L) 12/23/2020   PHOS 4.0 04/19/2020    Plt: Lab Results  Component Value Date   PLT 254 12/23/2020    COVID-19 Labs  No results for input(s): DDIMER, FERRITIN, LDH, CRP in the last 72 hours.  Lab Results  Component Value Date   SARSCOV2NAA POSITIVE (A) 11/10/2020   SARSCOV2NAA NEGATIVE 10/01/2020   SARSCOV2NAA NEGATIVE 04/18/2020   SARSCOV2NAA NEGATIVE 04/10/2020      ABG    Component Value Date/Time   PHART 7.350 12/24/2020 0013   PCO2ART 62.6 (H) 12/24/2020 0013   PO2ART 76.7 (L) 12/24/2020 0013   HCO3 33.7 (H) 12/24/2020 0013   TCO2 36 (H) 10/03/2020 2121   O2SAT 94.2 12/24/2020 0013       Recent Labs  Lab 12/23/20 1533  WBC 13.8*  NEUTROABS 9.8*  HGB 10.6*  HCT 35.7*  MCV 98.3  PLT 254    HG/HCT   stable,      Component Value Date/Time   HGB 10.6 (L) 12/23/2020 1533   HCT 35.7 (L)  12/23/2020 1533   MCV 98.3 12/23/2020 1533      BNP (last 3 results) Recent Labs    08/13/20 1845 11/11/20 0000 12/23/20 1534  BNP 39.4 14.4 31.3    DM  labs:  HbA1C: Recent Labs    04/11/20 0750 04/19/20 0231  HGBA1C 6.5* 6.2*    CBG (last 3)  Recent Labs    12/23/20 1540  GLUCAP 94     Cultures:    Component Value Date/Time   SDES URINE, RANDOM 04/19/2020 2115   [place] NONE 04/19/2020 2115   CULT  04/19/2020 2115    NO GROWTH Performed at [facility] [address] [place], [place] [address]    REPTSTATUS 04/21/2020 FINAL 04/19/2020 2115     Radiological Exams on Admission: DG Chest Port 1 View  Result Date: 12/23/2020 CLINICAL DATA:  Weakness. EXAM: PORTABLE CHEST 1 VIEW COMPARISON:  December 09, 2020. FINDINGS: Stable cardiomegaly. Mild bibasilar subsegmental atelectasis or infiltrates are noted. Bony thorax is unremarkable. IMPRESSION: Mild bibasilar subsegmental atelectasis or infiltrates. Electronically Signed   By: Bobbe Medico.D.  On: 12/23/2020 17:56   _______________________________________________________________________________________________________ Latest  Blood pressure 124/60, pulse 75, temperature 97.9 F (36.6 C), temperature source Oral, resp. rate 19, height _0  (1.651 m), weight 122.5 kg, SpO2 99 %.   Review of Systems:    Pertinent positives include: shortness of breath at rest. fatigue,   Constitutional:  No weight loss, night sweats, Fevers, chills,weight loss  HEENT:  No headaches, Difficulty swallowing,Tooth/dental problems,Sore throat,  No sneezing, itching, ear ache, nasal congestion, post nasal drip,  Cardio-vascular:  No chest pain, Orthopnea, PND, anasarca, dizziness, palpitations.no Bilateral lower extremity swelling  GI:  No heartburn, indigestion, abdominal pain, nausea, vomiting, diarrhea, change in bowel habits, loss of appetite, melena, blood in stool, hematemesis Resp:  no No dyspnea on exertion,  No excess mucus, no productive cough, No non-productive cough, No coughing up of blood.No change in color of mucus.No wheezing. Skin:  no rash or lesions. No jaundice GU:  no dysuria, change in color of urine, no urgency or frequency. No straining to urinate.  No flank pain.  Musculoskeletal:  No joint pain or no joint swelling. No decreased range of motion. No back pain.  Psych:  No change in mood or affect. No depression or anxiety. No memory loss.  Neuro: no localizing neurological complaints, no tingling, no weakness, no double vision, no gait abnormality, no slurred speech, no confusion  All systems reviewed and apart from [place] all are negative _______________________________________________________________________________________________ Past Medical History:   Past Medical History:  Diagnosis Date   Anxiety    Arthritis    Asthma    Chronic diastolic CHF (congestive heart failure) (HCC)    COPD (chronic obstructive pulmonary disease) (HCC)    Uses Oxygen at night   Depression    Diabetes mellitus without complication (HCC)    Dyspnea    occ   Family history of thyroid cancer    GERD (gastroesophageal reflux disease)    Gout    Headache    migraines   History of DVT of lower extremity    History of nuclear stress test    Myoview 2/17:  Low risk stress nuclear study with a small, moderate intensity, partially reversible inferior lateral defect consistent with small prior infarct and minimal peri-infarct ischemia; EF 68 with normal wall motion   Hypertension    Pneumonia    Thyroid cancer ([place]) 09/23/2016     Past Surgical History:  Procedure Laterality Date   BUNIONECTOMY Bilateral    CARDIAC CATHETERIZATION N/A 06/23/2015   Procedure: Right/Left Heart Cath and Coronary Angiography;  Surgeon: [name], MD;  Location: [facility];  Service: Cardiovascular;  Laterality: N/A;   COLONOSCOPY WITH PROPOFOL N/A 12/15/2015   Procedure: COLONOSCOPY WITH PROPOFOL;   Surgeon: [name], MD;  Location: [facility];  Service: Endoscopy;  Laterality: N/A;   RIGHT HEART CATH N/A 10/01/2019   Procedure: RIGHT HEART CATH;  Surgeon: [name], MD;  Location: [facility];  Service: Cardiovascular;  Laterality: N/A;   THYROIDECTOMY  09/19/2016   THYROIDECTOMY N/A 09/19/2016   Procedure: TOTAL THYROIDECTOMY;  Surgeon: [name], MD;  Location: [facility];  Service: General;  Laterality: N/A;   TONSILLECTOMY     TOTAL ABDOMINAL HYSTERECTOMY  07/14/10    Social History:  Ambulatory cane,      reports that she quit smoking about 4 years ago. Her smoking use included cigarettes. She has a 20.50 pack-year smoking history. She has never used smokeless tobacco. She reports that she  does not currently use alcohol. She reports that she does not use drugs.   Family History:   Family History  Problem Relation Age of Onset   Cancer Father        thought to be due to exposure to concrete   Diabetes Mother    Thyroid cancer Mother        dx in her 69s-60s   Cancer Maternal Uncle        2 uncles with cancer NOS   Brain cancer Paternal Aunt    Cancer Cousin        maternal first cousin - NOS   Cancer Cousin        maternal first cousin - NOS   ______________________________________________________________________________________________ Allergies: Allergies  Allergen Reactions   Bee Venom Swelling and Other (See Comments)    "All over my body" (swelling)   Fioricet [Butalbital-Apap-Caffeine] Nausea And Vomiting and Rash   Ibuprofen Rash and Other (See Comments)    Severe rash   Lamisil [Terbinafine] Rash and Other (See Comments)    Pt states this causes her to "feel funny"   Nsaids Other (See Comments)    Per MD's orders      Prior to Admission medications   Medication Sig Start Date End Date Taking? Authorizing Provider  albuterol (PROVENTIL HFA;VENTOLIN HFA) 108 (90 Base) MCG/ACT inhaler Inhale 1-2 puffs into the lungs every 6 (six) hours as  needed for wheezing or shortness of breath. 04/23/18   Long, [name], MD  allopurinol (ZYLOPRIM) 100 MG tablet Take 100 mg by mouth daily.    [provider]  benzonatate (TESSALON) 200 MG capsule Take 1 capsule (200 mg total) by mouth in the morning, at noon, and at bedtime. 7 DS 04/13/20   Regalado, Jerald Kief A, MD  Blood Glucose Monitoring Suppl (ACCU-CHEK AVIVA PLUS) w/Device KIT 1 each by Other route daily.  08/07/17   [provider]  butalbital-acetaminophen-caffeine (FIORICET) 50-325-40 MG tablet Take 1 tablet by mouth 2 (two) times daily as needed for migraine. 08/13/20   [provider]  calcitRIOL (ROCALTROL) 0.5 MCG capsule Take 1 capsule (0.5 mcg total) by mouth daily. 09/26/16   Hongalgi, [name], MD  carvedilol (COREG) 6.25 MG tablet Take 1 tablet (6.25 mg total) by mouth 2 (two) times daily with a meal. 01/23/18   Bensimhon, [name], MD  clonazePAM (KLONOPIN) 2 MG tablet Take 1 tablet (2 mg total) by mouth 3 (three) times daily. for anxiety Patient taking differently: Take 2 mg by mouth 3 (three) times daily. 06/23/19   [name], MD  cyclobenzaprine (FLEXERIL) 10 MG tablet Take 10 mg by mouth 3 (three) times daily. 11/19/19   [provider]  diclofenac sodium (VOLTAREN) 1 % GEL Apply 2 g topically 4 (four) times daily as needed (for pain). Patient not taking: No sig reported 06/19/18   [name], MD  EPINEPHrine 0.3 mg/0.3 mL IJ SOAJ injection Inject 0.3 mg into the muscle once as needed for anaphylaxis.    [provider]  FEROSUL 325 (65 Fe) MG tablet Take 325 mg by mouth daily. 03/22/17   [provider]  glucose blood test strip Use as instructed to check blood sugar up to 4 times daily Patient taking differently: 1 each by Other route See admin instructions. Use as instructed to check blood sugar up to 4 times daily 08/06/17   [name], DO  guaiFENesin (MUCINEX) 600 MG 12 hr tablet Take 2 tablets (1,200 mg total)  by  mouth 2 (two) times daily. 04/13/20   Regalado, Belkys A, MD  ipratropium-albuterol (DUONEB) 0.5-2.5 (3) MG/3ML SOLN Take 3 mLs by nebulization 4 (four) times daily.  04/05/17   [provider]  levothyroxine (SYNTHROID, LEVOTHROID) 112 MCG tablet Take 224 mcg by mouth daily before breakfast. 07/11/17   [provider]  lisinopril (PRINIVIL,ZESTRIL) 5 MG tablet Take 1 tablet (5 mg total) by mouth daily. Please call for office visit [phone] Patient taking differently: Take 5 mg by mouth daily. 08/15/17   [name], MD  loperamide (IMODIUM) 2 MG capsule Take 1 capsule (2 mg total) by mouth 4 (four) times daily as needed for diarrhea or loose stools. 0/98/11   [name], MD  metFORMIN (GLUCOPHAGE) 500 MG tablet Take 1 tablet (500 mg total) by mouth 2 (two) times daily with a meal. 12/11/14   [name], MD  nitroGLYCERIN (NITROSTAT) 0.4 MG SL tablet Place 1 tablet (0.4 mg total) under the tongue every 5 (five) minutes as needed for chest pain. 06/19/18   [name], MD  nystatin cream (MYCOSTATIN) Apply 1 application topically 2 (two) times daily as needed for dry skin. 04/13/20   Regalado, Belkys A, MD  nystatin ointment (MYCOSTATIN) Apply 1 application topically 2 (two) times daily. 09/10/20   [provider]  omeprazole (PRILOSEC) 20 MG capsule Take 20 mg by mouth 2 (two) times daily before a meal. 11/19/19   [provider]  oxyCODONE-acetaminophen (PERCOCET) 10-325 MG tablet Take 1 tablet by mouth every 6 (six) hours as needed for pain. 12/03/19   [provider]  OXYGEN Inhale 2-3 L into the lungs continuous.    [provider]  oxymetazoline (AFRIN NASAL SPRAY) 0.05 % nasal spray Place 1 spray into both nostrils 2 (two) times daily. 10/04/20   Pearson Grippe, DO  polyethylene glycol (MIRALAX / GLYCOLAX) 17 g packet Take 17 g by mouth daily as needed for mild constipation.    [provider]  potassium chloride SA  (K-DUR,KLOR-CON) 20 MEQ tablet Take 1 tablet (20 mEq total) by mouth 2 (two) times daily. 07/24/18   [name], MD  predniSONE (DELTASONE) 20 MG tablet Take 2 tablets (40 mg total) by mouth daily. 11/11/20   Horton, Barbette Hair, MD  rosuvastatin (CRESTOR) 10 MG tablet Take 10 mg by mouth every evening. 11/19/19   [provider]  senna-docusate (SENNA-PLUS) 8.6-50 MG tablet Take 1 tablet by mouth daily.    [provider]  torsemide (DEMADEX) 20 MG tablet Take 2 tablets (40 mg total) by mouth 2 (two) times daily. 12/17/20   [name], MD  traZODone (DESYREL) 50 MG tablet Take 100 mg by mouth at bedtime. 11/19/19   [provider]  triamcinolone cream (KENALOG) 0.1 % Apply 1 application topically 2 (two) times daily. 09/10/20   [provider]  umeclidinium-vilanterol (ANORO ELLIPTA) 62.5-25 MCG/INH AEPB Inhale 1 puff into the lungs daily.    [provider]    ___________________________________________________________________________________________________ Physical Exam: Vitals with BMI 12/23/2020 12/23/2020 12/23/2020  Height - - -  Weight - - -  BMI - - -  Systolic 914 782 -  Diastolic 60 60 -  Pulse 75 75 77     1. General:  in No  Acute distress   Chronically ill -appearing 2. Psychological:somnolent Oriented to self 3. Head/ENT:    Dry Mucous Membranes  Head Non traumatic, neck supple                           Poor Dentition 4. SKIN: decreased Skin turgor,  Skin clean Dry and intact no rash 5. Heart: Regular rate and rhythm no  Murmur, no Rub or gallop 6. Lungs: occasional wheezes or crackles   7. Abdomen: Soft,  non-tender, Non distended   obese  bowel sounds present 8. Lower extremities: no clubbing, cyanosis, no  edema 9. Neurologically Grossly intact, moving all 4 extremities equally   10. MSK: Normal range of motion    Chart has been  reviewed  ______________________________________________________________________________________________  Assessment/Plan 60 y.o. female with medical history significant of morbid obesity, COPD on 4Lat baseline, chronic diastolic heart failure, hypertension, chronic pain, hypothyroidism, gout   Admitted for  acute on chronic respiratory failure with hypercarbia secondary to COPD exacerbation  Present on Admission:  Respiratory failure with hypercapnia ([place]) -  this patient has acute respiratory failure with Hypoxia and  Hypercarbia as documented by the presence of following: O2 saturatio< 90% on RA  pH <7.35 with pCO2 >50  Likely due to:  COPD exacerbation,   Provide O2 therapy and titrate as needed  Continuous pulse ox   check Pulse ox with ambulation prior to discharge  flutter valve ordered   COPD exacerbation ([place]) -  -  - Will initiate: Steroid taper  -  Antibiotics rocephin - Albuterol  PRN, - scheduled duoneb,  -  Breo or Dulera at discharge   -  Mucinex.  Titrate O2 to saturation >90%. Follow patients respiratory status.  BiPAP ordered   for increased work of breathing. Keep on Bipap over night  Currently mentating well no evidence of symptomatic hypercarbia Would benefit from pulmonology consult in AM   Tobacco abuse in remission   Pulmonary hypertension ([place]) continue oxygen will need to follow-up with pulmonology as an outpatient   OSA (obstructive sleep apnea) -May benefit from having BiPAP set up at home   Obesity hypoventilation syndrome ([place]) continue BiPAP   HLD (hyperlipidemia) restart home meds   Chronic diastolic CHF (congestive heart failure) (HCC) continue torsemide for today Monitor fluid status   Benign essential HTN continue home medications   Acute metabolic encephalopathy secondary to hypercarbia improved with BiPAP continue to monitor   Anemia obtain anemia panel  DM2- - Order Sensitive SSI     -  check TSH and HgA1C  - Hold by mouth  medications    Hypothyroidism continue Synthroid check TSH Other plan as per orders.  DVT prophylaxis:  Lovenox       Code Status:    Code Status: Prior FULL CODE as per patient   I had personally discussed CODE STATUS with patient     Family Communication:   Family not at  Bedside  plan of care was discussed on the phone with aunt  Disposition Plan:    To home once workup is complete and patient is stable   Following barriers for discharge:                       Off BIPAP and respiratory status back to baseline                           Will likely need home health, home O2, set up  Would benefit from PT/OT eval prior to DC  Ordered                     Consults called: nonee tonight but will benefir from follow up with pumnology  Admission status:  ED Disposition     ED Disposition  [place]: [facility] [[id number]]  Level of Care: Stepdown [14]  Admit to SDU based on following criteria: Respiratory Distress:  Frequent assessment and/or intervention to maintain adequate ventilation/respiration, pulmonary toilet, and respiratory treatment.  May admit patient to [facility] or [facility] if equivalent level of care is available:: No  Covid Evaluation: Asymptomatic Screening Protocol (No Symptoms)  Diagnosis: Respiratory failure with hypercapnia [facility]) [[id number]]  Admitting Physician: Toy Baker [[id number]]  Attending Physician: Toy Baker [[id number]]  Estimated length of stay: past midnight tomorrow  Certification:: I certify this patient will need inpatient services for at least 2 midnights             inpatient     I Expect 2 midnight stay secondary to severity of patient's current illness need for inpatient interventions justified by the following:  hemodynamic instability despite optimal treatment (tachycardia  hypoxia, hypercapnia)  Severe lab/radiological/exam abnormalities including:     Hypercarbia  and extensive comorbidities including:    DM2   CHF     COPD/asthma  Morbid Obesity    That are currently affecting medical management.   I expect  patient to be hospitalized for 2 midnights requiring inpatient medical care.  Patient is at high risk for adverse outcome (such as loss of life or disability) if not treated.  Indication for inpatient stay as follows:  Severe change from baseline regarding mental status   inability to maintain oral hydration    New or worsening hypoxia Need for BiPAP Need for IV antibiotics,      Level of care       SDU tele indefinitely please discontinue once patient no longer qualifies COVID-19 Labs    Lab Results  Component Value Date   [place] (A) 11/10/2020     Precautions: admitted as had recent Covid infection in the past 90 days  PPE: Used by the provider:   N95  eye Goggles,  Gloves    Kamdyn Colborn 12/24/2020, 1:08 AM    [facility]     after 2 AM please page floor coverage PA If 7AM-7PM, please contact the day team taking care of the patient using [email or web address]   Patient was evaluated in the context of the global COVID-19 pandemic, which necessitated consideration that the patient might be at risk for infection with the SARS-CoV-2 virus that causes COVID-19. Institutional protocols and algorithms that pertain to the evaluation of patients at risk for COVID-19 are in a state of rapid change based on information released by regulatory bodies including the CDC and federal and state organizations. These policies and algorithms were followed during the patient's care.

## 2020-12-24 DIAGNOSIS — J441 Chronic obstructive pulmonary disease with (acute) exacerbation: Secondary | ICD-10-CM | POA: Diagnosis not present

## 2020-12-24 DIAGNOSIS — J9621 Acute and chronic respiratory failure with hypoxia: Secondary | ICD-10-CM | POA: Diagnosis not present

## 2020-12-24 DIAGNOSIS — J9622 Acute and chronic respiratory failure with hypercapnia: Secondary | ICD-10-CM | POA: Diagnosis not present

## 2020-12-24 DIAGNOSIS — I110 Hypertensive heart disease with heart failure: Secondary | ICD-10-CM | POA: Diagnosis not present

## 2020-12-24 DIAGNOSIS — I10 Essential (primary) hypertension: Secondary | ICD-10-CM | POA: Diagnosis not present

## 2020-12-24 DIAGNOSIS — G9341 Metabolic encephalopathy: Secondary | ICD-10-CM | POA: Diagnosis not present

## 2020-12-24 DIAGNOSIS — J9602 Acute respiratory failure with hypercapnia: Secondary | ICD-10-CM | POA: Diagnosis not present

## 2020-12-24 DIAGNOSIS — I5032 Chronic diastolic (congestive) heart failure: Secondary | ICD-10-CM | POA: Diagnosis not present

## 2020-12-24 DIAGNOSIS — D649 Anemia, unspecified: Secondary | ICD-10-CM | POA: Diagnosis not present

## 2020-12-24 DIAGNOSIS — I272 Pulmonary hypertension, unspecified: Secondary | ICD-10-CM | POA: Diagnosis not present

## 2020-12-24 DIAGNOSIS — E119 Type 2 diabetes mellitus without complications: Secondary | ICD-10-CM | POA: Diagnosis not present

## 2020-12-24 LAB — RAPID URINE DRUG SCREEN, HOSP PERFORMED
Amphetamines: NOT DETECTED
Barbiturates: POSITIVE — AB
Benzodiazepines: POSITIVE — AB
Cocaine: NOT DETECTED
Opiates: NOT DETECTED
Tetrahydrocannabinol: NOT DETECTED

## 2020-12-24 LAB — BLOOD GAS, ARTERIAL
Acid-Base Excess: 6.9 mmol/L — ABNORMAL HIGH (ref 0.0–2.0)
Bicarbonate: 33.7 mmol/L — ABNORMAL HIGH (ref 20.0–28.0)
Drawn by: 422461
FIO2: 40
O2 Saturation: 94.2 %
Patient temperature: 98.6
pCO2 arterial: 62.6 mmHg — ABNORMAL HIGH (ref 32.0–48.0)
pH, Arterial: 7.35 (ref 7.350–7.450)
pO2, Arterial: 76.7 mmHg — ABNORMAL LOW (ref 83.0–108.0)

## 2020-12-24 LAB — FOLATE: Folate: 8.5 ng/mL (ref >5.9)

## 2020-12-24 LAB — CBC WITH DIFFERENTIAL/PLATELET
Abs Immature Granulocytes: 0.14 10*3/uL — ABNORMAL HIGH (ref 0.00–0.07)
Basophils Absolute: 0 10*3/uL (ref 0.0–0.1)
Basophils Relative: 0 %
Eosinophils Absolute: 0 10*3/uL (ref 0.0–0.5)
Eosinophils Relative: 0 %
HCT: 38.1 % (ref 36.0–46.0)
Hemoglobin: 11.4 g/dL — ABNORMAL LOW (ref 12.0–15.0)
Immature Granulocytes: 1 %
Lymphocytes Relative: 5 %
Lymphs Abs: 0.7 10*3/uL (ref 0.7–4.0)
MCH: 28.9 pg (ref 26.0–34.0)
MCHC: 29.9 g/dL — ABNORMAL LOW (ref 30.0–36.0)
MCV: 96.7 fL (ref 80.0–100.0)
Monocytes Absolute: 0.1 10*3/uL (ref 0.1–1.0)
Monocytes Relative: 0 %
Neutro Abs: 12.5 10*3/uL — ABNORMAL HIGH (ref 1.7–7.7)
Neutrophils Relative %: 94 %
Platelets: 279 10*3/uL (ref 150–400)
RBC: 3.94 MIL/uL (ref 3.87–5.11)
RDW: 14.1 % (ref 11.5–15.5)
WBC: 13.4 10*3/uL — ABNORMAL HIGH (ref 4.0–10.5)
nRBC: 0 % (ref 0.0–0.2)

## 2020-12-24 LAB — URINALYSIS, COMPLETE (UACMP) WITH MICROSCOPIC
Bacteria, UA: NONE SEEN
Bilirubin Urine: NEGATIVE
Glucose, UA: NEGATIVE mg/dL
Hgb urine dipstick: NEGATIVE
Ketones, ur: NEGATIVE mg/dL
Leukocytes,Ua: NEGATIVE
Nitrite: NEGATIVE
Protein, ur: NEGATIVE mg/dL
Specific Gravity, Urine: 1.014 (ref 1.005–1.030)
pH: 7 (ref 5.0–8.0)

## 2020-12-24 LAB — GLUCOSE, CAPILLARY
Glucose-Capillary: 123 mg/dL — ABNORMAL HIGH (ref 70–99)
Glucose-Capillary: 128 mg/dL — ABNORMAL HIGH (ref 70–99)
Glucose-Capillary: 160 mg/dL — ABNORMAL HIGH (ref 70–99)
Glucose-Capillary: 162 mg/dL — ABNORMAL HIGH (ref 70–99)
Glucose-Capillary: 172 mg/dL — ABNORMAL HIGH (ref 70–99)
Glucose-Capillary: 175 mg/dL — ABNORMAL HIGH (ref 70–99)

## 2020-12-24 LAB — COMPREHENSIVE METABOLIC PANEL
ALT: 24 U/L (ref 0–44)
AST: 27 U/L (ref 15–41)
Albumin: 4 g/dL (ref 3.5–5.0)
Alkaline Phosphatase: 97 U/L (ref 38–126)
Anion gap: 14 (ref 5–15)
BUN: 16 mg/dL (ref 6–20)
CO2: 31 mmol/L (ref 22–32)
Calcium: 8.9 mg/dL (ref 8.9–10.3)
Chloride: 96 mmol/L — ABNORMAL LOW (ref 98–111)
Creatinine, Ser: 0.65 mg/dL (ref 0.44–1.00)
GFR, Estimated: >60 mL/min (ref >60)
Glucose, Bld: 176 mg/dL — ABNORMAL HIGH (ref 70–99)
Potassium: 3.8 mmol/L (ref 3.5–5.1)
Sodium: 141 mmol/L (ref 135–145)
Total Bilirubin: 0.3 mg/dL (ref 0.3–1.2)
Total Protein: 8 g/dL (ref 6.5–8.1)

## 2020-12-24 LAB — TSH: TSH: 0.8 u[IU]/mL (ref 0.350–4.500)

## 2020-12-24 LAB — FERRITIN: Ferritin: 113 ng/mL (ref 11–307)

## 2020-12-24 LAB — HEMOGLOBIN A1C
Hgb A1c MFr Bld: 6.2 % — ABNORMAL HIGH (ref 4.8–5.6)
Mean Plasma Glucose: 131.24 mg/dL

## 2020-12-24 LAB — MAGNESIUM: Magnesium: 2.4 mg/dL (ref 1.7–2.4)

## 2020-12-24 LAB — IRON AND TIBC
Iron: 42 ug/dL (ref 28–170)
Saturation Ratios: 11 % (ref 10.4–31.8)
TIBC: 368 ug/dL (ref 250–450)
UIBC: 326 ug/dL

## 2020-12-24 LAB — RETICULOCYTES
Immature Retic Fract: 15.5 % (ref 2.3–15.9)
RBC.: 3.91 MIL/uL (ref 3.87–5.11)
Retic Count, Absolute: 116.5 10*3/uL (ref 19.0–186.0)
Retic Ct Pct: 3 % (ref 0.4–3.1)

## 2020-12-24 LAB — PHOSPHORUS: Phosphorus: 3 mg/dL (ref 2.5–4.6)

## 2020-12-24 LAB — VITAMIN B12: Vitamin B-12: 544 pg/mL (ref 180–914)

## 2020-12-24 MED ORDER — CLONAZEPAM 1 MG PO TABS
1.0000 mg | ORAL_TABLET | Freq: Once | ORAL | Status: AC
  Administered 2020-12-24: 1 mg via ORAL
  Filled 2020-12-24: qty 1

## 2020-12-24 MED ORDER — CALCITRIOL 0.5 MCG PO CAPS
0.5000 ug | ORAL_CAPSULE | Freq: Every day | ORAL | Status: DC
  Administered 2020-12-24 – 2020-12-25 (×2): 0.5 ug via ORAL
  Filled 2020-12-24 (×2): qty 1

## 2020-12-24 MED ORDER — ENOXAPARIN SODIUM 80 MG/0.8ML IJ SOSY
70.0000 mg | PREFILLED_SYRINGE | Freq: Every day | INTRAMUSCULAR | Status: DC
  Administered 2020-12-24: 70 mg via SUBCUTANEOUS
  Filled 2020-12-24: qty 0.7

## 2020-12-24 MED ORDER — LISINOPRIL 2.5 MG PO TABS
5.0000 mg | ORAL_TABLET | Freq: Every day | ORAL | Status: DC
  Administered 2020-12-24 – 2020-12-25 (×2): 5 mg via ORAL
  Filled 2020-12-24 (×2): qty 2

## 2020-12-24 MED ORDER — OXYCODONE-ACETAMINOPHEN 10-325 MG PO TABS: 1.0000 | ORAL_TABLET | Freq: Four times a day (QID) | ORAL | Status: DC | PRN

## 2020-12-24 MED ORDER — CLONAZEPAM 1 MG PO TABS
2.0000 mg | ORAL_TABLET | Freq: Three times a day (TID) | ORAL | Status: DC
  Administered 2020-12-24 – 2020-12-25 (×4): 2 mg via ORAL
  Filled 2020-12-24 (×4): qty 2

## 2020-12-24 MED ORDER — HYDRALAZINE HCL 20 MG/ML IJ SOLN
10.0000 mg | INTRAMUSCULAR | Status: DC | PRN
  Administered 2020-12-24: 10 mg via INTRAVENOUS
  Filled 2020-12-24: qty 1

## 2020-12-24 MED ORDER — OXYCODONE-ACETAMINOPHEN 5-325 MG PO TABS: 1.0000 | ORAL_TABLET | ORAL | Status: DC | PRN

## 2020-12-24 MED ORDER — OXYCODONE-ACETAMINOPHEN 5-325 MG PO TABS
1.0000 | ORAL_TABLET | Freq: Four times a day (QID) | ORAL | Status: DC | PRN
Start: 2020-12-24 — End: 2020-12-25
  Administered 2020-12-24 – 2020-12-25 (×4): 1 via ORAL
  Filled 2020-12-24 (×4): qty 1

## 2020-12-24 MED ORDER — OXYCODONE HCL 5 MG PO TABS
5.0000 mg | ORAL_TABLET | Freq: Four times a day (QID) | ORAL | Status: DC | PRN
Start: 2020-12-24 — End: 2020-12-25
  Administered 2020-12-24 – 2020-12-25 (×3): 5 mg via ORAL
  Filled 2020-12-24 (×3): qty 1

## 2020-12-24 MED ORDER — SODIUM CHLORIDE 0.9 % IV SOLN
2.0000 g | INTRAVENOUS | Status: DC
  Administered 2020-12-24: 2 g via INTRAVENOUS
  Filled 2020-12-24: qty 20

## 2020-12-24 MED ORDER — FUROSEMIDE 10 MG/ML IJ SOLN
80.0000 mg | Freq: Two times a day (BID) | INTRAMUSCULAR | Status: DC
  Administered 2020-12-24 – 2020-12-25 (×3): 80 mg via INTRAVENOUS
  Filled 2020-12-24 (×3): qty 8

## 2020-12-24 MED ORDER — MORPHINE SULFATE (PF) 2 MG/ML IV SOLN
2.0000 mg | Freq: Once | INTRAVENOUS | Status: AC
Start: 2020-12-24 — End: 2020-12-24
  Administered 2020-12-24: 2 mg via INTRAVENOUS
  Filled 2020-12-24: qty 1

## 2020-12-24 MED ORDER — CARVEDILOL 6.25 MG PO TABS
6.2500 mg | ORAL_TABLET | Freq: Two times a day (BID) | ORAL | Status: DC
  Administered 2020-12-24 – 2020-12-25 (×2): 6.25 mg via ORAL
  Filled 2020-12-24 (×2): qty 1

## 2020-12-24 NOTE — Progress Notes (Signed)
PROGRESS NOTE    Michele Owens  [id number] DOB: 17-May-1960 DOA: 12/23/2020 PCP: [name], MD   Brief Narrative:  Michele Owens is a 60 y.o. female with medical history significant of morbid obesity, COPD on 2-3L at baseline, chronic diastolic heart failure, hypertension, chronic pain, hypothyroidism, gout who presents with acute encephalopathy, worsening respiratory status, fatigue weakness and ambulatory dysfunction.  Patient noted to be orthopneic with bilateral lower extremity edema at intake concerning for heart failure exacerbation, cannot rule out concurrent COPD exacerbation although does not appear to be acutely hypoxic from baseline.  Of note patient also recently positive for COVID November 10, 2020 but is testing negative for COVID-19 at intake.   Assessment & Plan:   Acute on chronic respiratory failure with hypercapnia - Likely multifactorial in the setting of COPD exacerbation and heart failure exacerbation and obesity hypoventilation syndrome -O2 sats on room air today 87 to 86% at rest.  - Continue to wean oxygen back to baseline of 2-3L of oxygen per prescription, patient indicates she has been on 4 L nasal cannula lately due to worsening hypoxia and dyspnea at home   COPD exacerbation  -Continue steroid taper, 5-day course of ceftriaxone -Continue nebs, supportive care, wean oxygen as tolerated back to baseline of 2 to 3 L -Continue BiPAP as needed  Heart failure, diastolic, acute exacerbation POA, -Discontinue p.o. torsemide, initiate Lasix 80 IV twice daily and follow I's and O's -3+ pitting edema bilateral lower extremity bibasilar rales with moderate orthopnea  Tobacco abuse  - In remission   Pulmonary hypertension Follow-up with outpatient pulmonology   OSA (obstructive sleep apnea) -May benefit from transition to BiPAP at home, defer to outpatient pulmonology   Obesity hypoventilation syndrome (HCC) continue BiPAP   HLD (hyperlipidemia) restart  home meds   Benign essential HTN continue home medications   Acute metabolic encephalopathy  Secondary to hypercarbia improved with BiPAP continue to monitor   DM2, well controlled- A1c 6.2, continue sliding scale insulin resume home medications at discharge   Hypothyroidism continue Synthroid check TSH  DVT prophylaxis: Lovenox Code Status: Full Family Communication: None present  Status is: Inpatient  Dispo: The patient is from: Sent home              Anticipated d/c is to: To be determined              Anticipated d/c date is: 40 to 72 hours              Patient currently not medically stable for discharge  Consultants:  None  Procedures:  None  Antimicrobials:  Ceftriaxone x5 days -stop date 12/27/2020  Subjective: No acute issues or events overnight per staff, patient apparently upset about n.p.o. status overnight and not having home meds resumed at intake, otherwise denies chest pain nausea vomiting diarrhea constipation headache fevers chills.  Objective: Vitals:   12/24/20 0228 12/24/20 0305 12/24/20 0347 12/24/20 0400  BP: (!) 191/86 134/79    Pulse:  92 90   Resp:  (!) 26 (!) 24   Temp:    98.1 F (36.7 C)  TempSrc:    Axillary  SpO2:  95% 97%   Weight:      Height:        Intake/Output Summary (Last 24 hours) at 12/24/2020 0732 Last data filed at 12/24/2020 0635 Gross per 24 hour  Intake 98.66 ml  Output --  Net 98.66 ml   Filed Weights   12/23/20 1505 12/23/20 2200  Weight: 122.5 kg (!) 142 kg    Examination:  General:  Pleasantly resting in bed, No acute distress. HEENT:  Normocephalic atraumatic.  Sclerae nonicteric, noninjected.  Extraocular movements intact bilaterally. Neck:  Without mass or deformity.  Trachea is midline. Lungs: Diminished bilaterally, bibasilar rales without overt rhonchi or wheeze Heart:  Regular rate and rhythm.  Without murmurs, rubs, or gallops. Abdomen:  Soft, obese, nontender, nondistended.  Without guarding or  rebound. Extremities: Without cyanosis, clubbing, 3+ bilateral lower extremity edema. Vascular:  Dorsalis pedis and posterior tibial pulses palpable bilaterally. Skin:  Warm and dry, no erythema, no ulcerations.  Data Reviewed: I have personally reviewed following labs and imaging studies  CBC: Recent Labs  Lab 12/23/20 1533 12/24/20 0253  WBC 13.8* 13.4*  NEUTROABS 9.8* 12.5*  HGB 10.6* 11.4*  HCT 35.7* 38.1  MCV 98.3 96.7  PLT 254 [id number]   Basic Metabolic Panel: Recent Labs  Lab 12/23/20 1533 12/24/20 0253  NA 139 141  K 4.6 3.8  CL 95* 96*  CO2 33* 31  GLUCOSE 100* 176*  BUN 21* 16  CREATININE 1.03* 0.65  CALCIUM 8.8* 8.9  MG 2.4 2.4  PHOS  --  3.0   GFR: Estimated Creatinine Clearance: 107.4 mL/min (by C-G formula based on SCr of 0.65 mg/dL). Liver Function Tests: Recent Labs  Lab 12/23/20 1533 12/24/20 0253  AST 31 27  ALT 23 24  ALKPHOS 87 97  BILITOT 0.3 0.3  PROT 7.3 8.0  ALBUMIN 3.8 4.0   No results for input(s): LIPASE, AMYLASE in the last 168 hours. No results for input(s): AMMONIA in the last 168 hours. Coagulation Profile: No results for input(s): INR, PROTIME in the last 168 hours. Cardiac Enzymes: No results for input(s): CKTOTAL, CKMB, CKMBINDEX, TROPONINI in the last 168 hours. BNP (last 3 results) No results for input(s): PROBNP in the last 8760 hours. HbA1C: Recent Labs    12/23/20 2202  HGBA1C 6.2*   CBG: Recent Labs  Lab 12/23/20 1540 12/23/20 2351 12/24/20 0350 12/24/20 0725  GLUCAP 94 173* 172* 162*   Lipid Profile: No results for input(s): CHOL, HDL, LDLCALC, TRIG, CHOLHDL, LDLDIRECT in the last 72 hours. Thyroid Function Tests: Recent Labs    12/24/20 0253  TSH 0.800   Anemia Panel: Recent Labs    12/24/20 0253  VITAMINB12 544  FOLATE 8.5  FERRITIN 113  TIBC 368  IRON 42  RETICCTPCT 3.0   Sepsis Labs: Recent Labs  Lab 12/23/20 1542  LATICACIDVEN 1.3    Recent Results (from the past 240 hour(s))   Resp Panel by RT-PCR (Flu A&B, Covid) Nasopharyngeal Swab     Status: None   Collection Time: 12/23/20  6:20 PM   Specimen: Nasopharyngeal Swab; Nasopharyngeal(NP) swabs in vial transport medium  Result Value Ref Range Status   SARS Coronavirus 2 by RT PCR NEGATIVE NEGATIVE Final    Comment: (NOTE) SARS-CoV-2 target nucleic acids are NOT DETECTED.  The SARS-CoV-2 RNA is generally detectable in upper respiratory specimens during the acute phase of infection. The lowest concentration of SARS-CoV-2 viral copies this assay can detect is 138 copies/mL. A negative result does not preclude SARS-Cov-2 infection and should not be used as the sole basis for treatment or other patient management decisions. A negative result may occur with  improper specimen collection/handling, submission of specimen other than nasopharyngeal swab, presence of viral mutation(s) within the areas targeted by this assay, and inadequate number of viral copies(<138 copies/mL). A negative result must be combined  with clinical observations, patient history, and epidemiological information. The expected result is Negative.  Fact Sheet for Patients:  EntrepreneurPulse.com.au  Fact Sheet for Healthcare Providers:  IncredibleEmployment.be  This test is no t yet approved or cleared by the [place] FDA and  has been authorized for detection and/or diagnosis of SARS-CoV-2 by FDA under an Emergency Use Authorization (EUA). This EUA will remain  in effect (meaning this test can be used) for the duration of the COVID-19 declaration under Section 564(b)(1) of the Act, 21 U.S.C.section 360bbb-3(b)(1), unless the authorization is terminated  or revoked sooner.       Influenza A by PCR NEGATIVE NEGATIVE Final   Influenza B by PCR NEGATIVE NEGATIVE Final    Comment: (NOTE) The Xpert Xpress SARS-CoV-2/FLU/RSV plus assay is intended as an aid in the diagnosis of influenza from  Nasopharyngeal swab specimens and should not be used as a sole basis for treatment. Nasal washings and aspirates are unacceptable for Xpert Xpress SARS-CoV-2/FLU/RSV testing.  Fact Sheet for Patients: EntrepreneurPulse.com.au  Fact Sheet for Healthcare Providers: IncredibleEmployment.be  This test is not yet approved or cleared by the [place] FDA and has been authorized for detection and/or diagnosis of SARS-CoV-2 by FDA under an Emergency Use Authorization (EUA). This EUA will remain in effect (meaning this test can be used) for the duration of the COVID-19 declaration under Section 564(b)(1) of the Act, 21 U.S.C. section 360bbb-3(b)(1), unless the authorization is terminated or revoked.  Performed at [facility], [place] [address], [place], [place] [address]   MRSA Next Gen by PCR, Nasal     Status: None   Collection Time: 12/23/20 10:10 PM   Specimen: Nasal Mucosa; Nasal Swab  Result Value Ref Range Status   MRSA by PCR Next Gen NOT DETECTED NOT DETECTED Final    Comment: (NOTE) The GeneXpert MRSA Assay (FDA approved for NASAL specimens only), is one component of a comprehensive MRSA colonization surveillance program. It is not intended to diagnose MRSA infection nor to guide or monitor treatment for MRSA infections. Test performance is not FDA approved in patients less than 39 years old. Performed at [facility], [place] [address] [place], [place] [address]      Radiology Studies: [facility] Chest Port 1 View  Result Date: 12/23/2020 CLINICAL DATA:  Weakness. EXAM: PORTABLE CHEST 1 VIEW COMPARISON:  December 09, 2020. FINDINGS: Stable cardiomegaly. Mild bibasilar subsegmental atelectasis or infiltrates are noted. Bony thorax is unremarkable. IMPRESSION: Mild bibasilar subsegmental atelectasis or infiltrates. Electronically Signed   By: Marijo Conception M.D.   On: 12/23/2020 17:56    Scheduled Meds:   albuterol  2.5 mg Nebulization TID   allopurinol  100 mg Oral Daily   Chlorhexidine Gluconate Cloth  6 each Topical Q0600   enoxaparin (LOVENOX) injection  40 mg Subcutaneous QHS   insulin aspart  0-9 Units Subcutaneous Q4H   levothyroxine  224 mcg Oral QAC breakfast   pantoprazole  40 mg Oral Daily   predniSONE  40 mg Oral QPC supper   rosuvastatin  10 mg Oral QPM   sodium chloride flush  3 mL Intravenous Q12H   torsemide  40 mg Oral BID   umeclidinium-vilanterol  1 puff Inhalation Daily   Continuous Infusions:  sodium chloride     cefTRIAXone (ROCEPHIN)  IV Stopped (12/23/20 2325)     LOS: 1 day   Time spent: 23mn  Arriana Lohmann C Michaelah Credeur, DO [facility]  If 7PM-7AM, please contact night-coverage [email or web address]  12/24/2020, 7:32 AM

## 2020-12-24 NOTE — Evaluation (Signed)
Physical Therapy Evaluation Patient Details Name: Michele Owens MRN: [id number] DOB: [birth year] Today's Date: 12/24/2020  History of Present Illness  Michele Owens is a 60 y.o. female admitted with  hypercapnea. PMH: morbid obesity, COPD on 4Lat baseline, chronic diastolic heart failure, hypertension, chronic pain, hypothyroidism, gout.  Clinical Impression  Patient evaluated by Physical Therapy with no further acute PT needs identified. All education has been completed and the patient has no further questions.  Pt is mod I with mobility, no PT needs, recommend continued amb with staff as tolerated   See below for any follow-up Physical Therapy or equipment needs. PT is signing off. Thank you for this referral.     Recommendations for follow up therapy are one component of a multi-disciplinary discharge planning process, led by the attending physician.  Recommendations may be updated based on patient status, additional functional criteria and insurance authorization.  Follow Up Recommendations No PT follow up    Equipment Recommendations  None recommended by PT    Recommendations for Other Services       Precautions / Restrictions Restrictions Weight Bearing Restrictions: No      Mobility  Bed Mobility Overal bed mobility: Modified Independent                  Transfers Overall transfer level: Modified independent                  Ambulation/Gait Ambulation/Gait assistance: Supervision Gait Distance (Feet): 200 Feet Assistive device: Rolling walker (2 wheeled) Gait Pattern/deviations: Step-through pattern     General Gait Details: pt pulled of O2 sat finger probe so unable to get O2 reading. DOE 2-3/4. on 4L throughout, HR in 90s. steady gait with RW and without device--no LOB  Stairs            Wheelchair Mobility    Modified Rankin (Stroke Patients Only)       Balance Overall balance assessment: Needs assistance   Sitting balance-Leahy  Scale: Normal         Standing balance comment: Fair to Good. pt is able to reach outside BOS ~ 10 inches,pick up objects from floor with unilateral UE support             High level balance activites: Direction changes;Turns;Head turns;Side stepping High Level Balance Comments: no LOB with above             Pertinent Vitals/Pain Pain Assessment: Faces Faces Pain Scale: Hurts even more Pain Location: abd Pain Descriptors / Indicators: Cramping Pain Intervention(s): Monitored during session    Home Living Family/patient expects to be discharged to:: Private residence Living Arrangements: Alone Available Help at Discharge: Family;Available PRN/intermittently Type of Home: Apartment Home Access: Level entry     Home Layout: One level Home Equipment: Transport planner;Shower seat - built in;Cane - single point;Walker - 2 wheels;Bedside commode Additional Comments: home O2    Prior Function Level of Independence: Independent with assistive device(s)         Comments: amb with cane at baseline     Hand Dominance        Extremity/Trunk Assessment   Upper Extremity Assessment Upper Extremity Assessment: Overall WFL for tasks assessed    Lower Extremity Assessment Lower Extremity Assessment: Overall WFL for tasks assessed       Communication   Communication: No difficulties  Cognition Arousal/Alertness: Awake/alert Behavior During Therapy: WFL for tasks assessed/performed Overall Cognitive Status: Within Functional Limits for tasks assessed  General Comments      Exercises     Assessment/Plan    PT Assessment Patent does not need any further PT services  PT Problem List         PT Treatment Interventions      PT Goals (Current goals can be found in the Care Plan section)  Acute Rehab PT Goals PT Goal Formulation: All assessment and education complete, DC therapy    Frequency      Barriers to discharge        Co-evaluation               AM-PAC PT "6 Clicks" Mobility  Outcome Measure Help needed turning from your back to your side while in a flat bed without using bedrails?: None Help needed moving from lying on your back to sitting on the side of a flat bed without using bedrails?: None Help needed moving to and from a bed to a chair (including a wheelchair)?: None Help needed standing up from a chair using your arms (e.g., wheelchair or bedside chair)?: None Help needed to walk in hospital room?: None Help needed climbing 3-5 steps with a railing? : A Little 6 Click Score: 23    End of Session   Activity Tolerance: Patient tolerated treatment well Patient left: in bed;with call bell/phone within reach;with nursing/sitter in room Nurse Communication: Mobility status PT Visit Diagnosis: Other abnormalities of gait and mobility (R26.89)    Time: [id number] PT Time Calculation (min) (ACUTE ONLY): 44 min   Charges:   PT Evaluation $PT Eval Low Complexity: 1 Low PT Treatments $Gait Training: 23-37 mins        Roselyn Doby, PT  Acute Rehab Dept ([place]) [phone] Pager [phone]  12/24/2020   [facility] 12/24/2020, 10:03 AM

## 2020-12-24 NOTE — TOC Initial Note (Signed)
Transition of Care [facility]) - Initial/Assessment Note    Patient Details  Name: Michele Owens MRN: [id number] Date of Birth: [birth year]  Transition of [facility] CM/SW Contact:    Leeroy Cha, RN Phone Number: 12/24/2020, 8:33 AM  Clinical Narrative:                 Chief Complaint  Patient presents with   Weakness      generalized      HPI: Michele Owens is a 60 y.o. female with medical history significant of morbid obesity, COPD on 4Lat baseline, chronic diastolic heart failure, hypertension, chronic pain, hypothyroidism, gout     Presented with   fatigue cough productive of dark sputum Patient was stating she was feeling extra sleepy Had recurrent admissions home COPD exacerbations requiring intubation in the past. Patient initially lethargic difficult to obtain history once she is more awake with BiPAP reports she has been taking all her medications she does no longer smoke does not drink alcohol.  Did not have any fevers or chills. Recently was tested positive for COVID 10 November 2020 and finish her steroids Patient has clonazepam on her medication list. Has  been vaccinated against COVID   urine drug screen + for benzos and barbs From home plan is to return to home  Expected Discharge Plan: Home/Self Care Barriers to Discharge: Continued Medical Work up   Patient Goals and CMS Choice Patient states their goals for this hospitalization and ongoing recovery are:: to go home CMS Medicare.gov Compare Post Acute Care list provided to:: Patient Choice offered to / list presented to : Patient  Expected Discharge Plan and Services Expected Discharge Plan: Home/Self Care   Discharge Planning Services: CM Consult   Living arrangements for the past 2 months: Apartment                                      Prior Living Arrangements/Services Living arrangements for the past 2 months: Apartment Lives with:: Self Patient language and need for interpreter  reviewed:: Yes Do you feel safe going back to the place where you live?: Yes            Criminal Activity/Legal Involvement Pertinent to Current Situation/Hospitalization: No - Comment as needed  Activities of Daily Living Home Assistive Devices/Equipment: Cane (specify quad or straight) (straight) ADL Screening (condition at time of admission) Patient's cognitive ability adequate to safely complete daily activities?: Yes Is the patient deaf or have difficulty hearing?: No Does the patient have difficulty seeing, even when wearing glasses/contacts?: No Does the patient have difficulty concentrating, remembering, or making decisions?: No Patient able to express need for assistance with ADLs?: Yes Does the patient have difficulty dressing or bathing?: No Independently performs ADLs?: Yes (appropriate for developmental age) Does the patient have difficulty walking or climbing stairs?: Yes Weakness of Legs: Both Weakness of Arms/Hands: Both  Permission Sought/Granted                  Emotional Assessment Appearance:: Appears stated age     Orientation: : Oriented to Self, Oriented to Place, Oriented to  Time, Oriented to Situation Alcohol / Substance Use: Illicit Drugs Psych Involvement: No (comment)  Admission diagnosis:  Respiratory failure with hypercapnia ([place]) [J96.92] Patient Active Problem List   Diagnosis Date Noted   Respiratory failure with hypercapnia ([place]) 12/23/2020   Anemia 12/23/2020   Acute on chronic respiratory  failure with hypoxia and hypercapnia ([place]) 04/18/2020   Acute on chronic respiratory failure with hypoxia (HCC) 04/18/2020   Hemoptysis [id number]   Acute metabolic encephalopathy [id number]   Acute encephalopathy 04/18/2020   Chronic diastolic CHF (congestive heart failure) ([place]) 04/18/2020   Sepsis ([place]) 04/18/2020   Morbid obesity with BMI of 50.0-59.9, adult ([place])    Fall at home, initial encounter 06/23/2018   HNP (herniated nucleus  pulposus), thoracic 06/23/2018   CHF (congestive heart failure) ([place]) 06/18/2018   Hypertensive disorder 08/28/2017   Obesity 08/28/2017   COPD exacerbation ([place]) 08/04/2017   Acute and chronic respiratory failure with hypercapnia ([place])    Pulmonary HTN ([place])    Genetic testing 02/27/2017   Family history of thyroid cancer    Hypomagnesemia 10/03/2016   Atypical chest pain    HLD (hyperlipidemia) 09/23/2016   Gout 09/23/2016   DVT (deep vein thrombosis) in pregnancy 09/23/2016   Thyroid cancer ([place]) 09/23/2016   Hypocalcemia 09/23/2016   AKI (acute kidney injury) ([place]) 09/23/2016   Acute pulmonary edema (HCC)    Acute hypoxemic respiratory failure (HCC)    Ventilator dependent (HCC)    Chronic respiratory failure with hypoxia and hypercapnia ([place]) 07/16/2016   CAP (community acquired pneumonia) 01/23/2016   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 01/23/2016   Pulmonary hypertension ([place]) 01/23/2016   Obesity hypoventilation syndrome ([place]) 08/26/2015   Dyslipidemia associated with type 2 diabetes mellitus ([place]) 04/24/2015   Leukocytosis 04/24/2015   Controlled type 2 diabetes mellitus without complication, without long-term current use of insulin ([place]) 04/24/2015   Anxiety 04/24/2015   Benign essential HTN 04/24/2015   Normocytic anemia 04/24/2015   OSA (obstructive sleep apnea) 05/10/2012   Tobacco abuse 07/04/2011   PCP:  [name], MD Pharmacy:   [place], [place] - [address] [place] [address] Phone: [phone] Fax: [place], [place] - [address] [place] [address] Phone: [phone] Fax: [phone]     Social Determinants of Health (SDOH) Interventions    Readmission Risk Interventions Readmission Risk Prevention Plan 07/05/2018  Transportation Screening Complete  Medication Review (RN Care  Manager) Referral to [place] Not Applicable  Some recent data might be hidden

## 2020-12-24 NOTE — Progress Notes (Signed)
PT demonstrated hands on understanding of Flutter device. 

## 2020-12-24 NOTE — Progress Notes (Signed)
Occupational Therapy Evaluation  Patient lives in apartment by herself and is mod I with self care and ambulation with use of cane at baseline. Patient does need increased time however able to don socks, perform functional transfers and ambulation without OT assistance. O2 remain stable in low to mid 90s on 4L with activity. No acute OT needs at this time, will sign off.    12/24/20 1400  OT Visit Information  Last OT Received On 12/24/20  Assistance Needed +1  History of Present Illness Michele Owens is a 60 y.o. female admitted with  hypercapnea. PMH: morbid obesity, COPD on 4Lat baseline, chronic diastolic heart failure, hypertension, chronic pain, hypothyroidism, gout.  Precautions  Precaution Comments monitor vitals  Restrictions  Weight Bearing Restrictions No  Home Living  Family/patient expects to be discharged to: Private residence  Living Arrangements Alone  Available Help at Discharge Family;Available PRN/intermittently  Type of Home Apartment  Home Access Level entry  Home Layout One level  Bathroom Shower/Tub Tub/shower unit  Print production planner scooter;Shower seat - built in;Cane - single point;Walker - 2 wheels;BSC  Additional Comments home O2  Prior Function  Level of Independence Independent with assistive device(s)  Comments amb with cane at baseline  Communication  Communication No difficulties  Pain Assessment  Pain Assessment Faces  Faces Pain Scale 4  Pain Location feet  Pain Descriptors / Indicators Sore  Pain Intervention(s) Monitored during session  Cognition  Arousal/Alertness Awake/alert  Behavior During Therapy WFL for tasks assessed/performed  Overall Cognitive Status Within Functional Limits for tasks assessed  Upper Extremity Assessment  Upper Extremity Assessment Overall WFL for tasks assessed  Lower Extremity Assessment  Lower Extremity Assessment Defer to PT evaluation  Cervical / Trunk Assessment  Cervical /  Trunk Assessment Other exceptions  Cervical / Trunk Exceptions body habitus  ADL  Overall ADL's  At baseline  General ADL Comments patient able to perform lower body dressing, functional ambulation and transfers without any physical assistance just increased time  Bed Mobility  Overal bed mobility Modified Independent  Transfers  Overall transfer level Independent  Equipment used None  Balance  Overall balance assessment No apparent balance deficits (not formally assessed)  General Comments  General comments (skin integrity, edema, etc.) O2 maintain low to mid 90s with ambulation on 4L  OT - End of Session  Equipment Utilized During Treatment Oxygen  Activity Tolerance Patient tolerated treatment well  Patient left in bed;with call bell/phone within reach;with family/visitor present  Nurse Communication Mobility status  OT Assessment  OT Recommendation/Assessment Patient does not need any further OT services  OT Visit Diagnosis Pain  Pain - part of body Ankle and joints of foot  OT Problem List Pain;Obesity  AM-PAC OT "6 Clicks" Daily Activity Outcome Measure (Version 2)  Help from another person eating meals? 4  Help from another person taking care of personal grooming? 4  Help from another person toileting, which includes using toliet, bedpan, or urinal? 4  Help from another person bathing (including washing, rinsing, drying)? 4  Help from another person to put on and taking off regular upper body clothing? 4  Help from another person to put on and taking off regular lower body clothing? 4  6 Click Score 24  Progressive Mobility  What is the highest level of mobility based on the progressive mobility assessment? Level 6 (Walks independently in room and hall) - Balance while walking in room without assist - Complete  Mobility Ambulated  independently in hallway  OT Recommendation  Follow Up Recommendations No OT follow up  OT Equipment Other (comment) (patient stating shower  chair too small, needs bariatic shower chair with better backing)  Acute Rehab OT Goals  Patient Stated Goal home  OT Goal Formulation All assessment and education complete, DC therapy  OT Time Calculation  OT Start Time (ACUTE ONLY) 1313  OT Stop Time (ACUTE ONLY) 1350  OT Time Calculation (min) 37 min  OT General Charges  $OT Visit 1 Visit  OT Evaluation  $OT Eval Moderate Complexity 1 Mod  Written Expression  Dominant Hand Right   Delbert Phenix OT OT pager: [phone]

## 2020-12-25 DIAGNOSIS — E119 Type 2 diabetes mellitus without complications: Secondary | ICD-10-CM | POA: Diagnosis not present

## 2020-12-25 DIAGNOSIS — I272 Pulmonary hypertension, unspecified: Secondary | ICD-10-CM | POA: Diagnosis not present

## 2020-12-25 DIAGNOSIS — J9602 Acute respiratory failure with hypercapnia: Secondary | ICD-10-CM | POA: Diagnosis not present

## 2020-12-25 DIAGNOSIS — J9622 Acute and chronic respiratory failure with hypercapnia: Secondary | ICD-10-CM | POA: Diagnosis not present

## 2020-12-25 DIAGNOSIS — G9341 Metabolic encephalopathy: Secondary | ICD-10-CM | POA: Diagnosis not present

## 2020-12-25 DIAGNOSIS — D649 Anemia, unspecified: Secondary | ICD-10-CM | POA: Diagnosis not present

## 2020-12-25 DIAGNOSIS — I10 Essential (primary) hypertension: Secondary | ICD-10-CM | POA: Diagnosis not present

## 2020-12-25 DIAGNOSIS — J9621 Acute and chronic respiratory failure with hypoxia: Secondary | ICD-10-CM | POA: Diagnosis not present

## 2020-12-25 DIAGNOSIS — J441 Chronic obstructive pulmonary disease with (acute) exacerbation: Secondary | ICD-10-CM | POA: Diagnosis not present

## 2020-12-25 DIAGNOSIS — I5032 Chronic diastolic (congestive) heart failure: Secondary | ICD-10-CM | POA: Diagnosis not present

## 2020-12-25 LAB — GLUCOSE, CAPILLARY
Glucose-Capillary: 116 mg/dL — ABNORMAL HIGH (ref 70–99)
Glucose-Capillary: 112 mg/dL — ABNORMAL HIGH (ref 70–99)
Glucose-Capillary: 133 mg/dL — ABNORMAL HIGH (ref 70–99)

## 2020-12-25 MED ORDER — BLOOD GLUCOSE MONITOR KIT: PACK | 0 refills | Status: DC

## 2020-12-25 NOTE — Progress Notes (Signed)
Pt was off of her bipap. Pt is resting well, no resp distress. Valinda standby.

## 2020-12-25 NOTE — Progress Notes (Signed)
SATURATION QUALIFICATIONS: (This note is used to comply with regulatory documentation for home oxygen)  Patient Saturations on Room Air at Rest = 87%  Patient Saturations on Room Air while Ambulating = 80%  Patient Saturations on 4 Liters of oxygen while Ambulating = 98%  Please briefly explain why patient needs home oxygen: Patient already wears 4L at baseline at home.

## 2020-12-25 NOTE — Discharge Summary (Signed)
Physician Discharge Summary  Michele Owens NTI:[id number] DOB: Nov 02, 1960 DOA: 12/23/2020  PCP: [name], MD  Admit date: 12/23/2020 Discharge date: 12/25/2020  Admitted From: Home Disposition: Home  Recommendations for Outpatient Follow-up:  Follow up with PCP in 1-2 weeks Please obtain BMP/CBC in one week  Home Health: None Equipment/Devices: Shower chair  Discharge Condition: Stable CODE STATUS: Full Diet recommendation: Low-salt low-fat diabetic diet  Brief/Interim Summary: Michele Owens is a 60 y.o. female with medical history significant of morbid obesity, COPD on 2-3L at baseline, chronic diastolic heart failure, hypertension, chronic pain, hypothyroidism, gout who presents with acute encephalopathy, worsening respiratory status, fatigue weakness and ambulatory dysfunction.  Patient noted to be orthopneic with bilateral lower extremity edema at intake concerning for heart failure exacerbation, cannot rule out concurrent COPD exacerbation although does not appear to be acutely hypoxic from baseline.  Of note patient also recently positive for COVID November 10, 2020 but is testing negative for COVID-19 at intake.   Assessment & Plan:   Acute on chronic respiratory failure with hypercapnia - Likely multifactorial in the setting of COPD exacerbation and heart failure exacerbation and obesity hypoventilation syndrome -Oxygen screen today remarkable for dyspnea and hypoxia at rest and with exertion - Continue to wean oxygen back to baseline of 2-3L of oxygen per prescription, patient indicates she has been on 4 L nasal cannula lately due to worsening hypoxia and dyspnea at home -continue to wean oxygen back to baseline, follow-up with PCP in the next 1 to 2 weeks as scheduled   COPD exacerbation  -Continue home inhaled medications, supportive care, wean oxygen as tolerated back to baseline of 2 to 3 L -Continue BiPAP as needed   Heart failure, diastolic, acute exacerbation  POA, -Edema resolving, transition back to p.o. torsemide home dose, follow with cardiology as scheduled   Tobacco abuse  - In remission   Pulmonary hypertension Follow-up with outpatient pulmonology   OSA (obstructive sleep apnea) -May benefit from transition to BiPAP at home, defer to outpatient pulmonology   Obesity hypoventilation syndrome (HCC) continue BiPAP   HLD (hyperlipidemia) restart home meds   Benign essential HTN continue home medications   Acute metabolic encephalopathy  Secondary to hypercarbia improved with BiPAP continue to monitor   DM2, well controlled- A1c 6.2, continue sliding scale insulin resume home medications at discharge   Hypothyroidism continue Synthroid    Discharge Instructions  Discharge Instructions     Diet - low sodium heart healthy   Complete by: As directed    Increase activity slowly   Complete by: As directed       Allergies as of 12/25/2020       Reactions   Bee Venom Swelling, Other (See Comments)   "All over my body" (swelling)   Fioricet [butalbital-apap-caffeine] Nausea And Vomiting, Rash   Ibuprofen Rash, Other (See Comments)   Severe rash   Lamisil [terbinafine] Rash, Other (See Comments)   Pt states this causes her to "feel funny"   Nsaids Other (See Comments)   Per MD's orders         Medication List     TAKE these medications    Accu-Chek Aviva Plus w/Device Kit 1 each by Other route daily.   Afrin Nasal Spray 0.05 % nasal spray Generic drug: oxymetazoline Place 1 spray into both nostrils 2 (two) times daily.   albuterol 108 (90 Base) MCG/ACT inhaler Commonly known as: VENTOLIN HFA Inhale 1-2 puffs into the lungs every 6 (six) hours as needed  for wheezing or shortness of breath.   allopurinol 100 MG tablet Commonly known as: ZYLOPRIM Take 100 mg by mouth daily.   Anoro Ellipta 62.5-25 MCG/INH Aepb Generic drug: umeclidinium-vilanterol Inhale 1 puff into the lungs daily.   benzonatate 200 MG  capsule Commonly known as: TESSALON Take 1 capsule (200 mg total) by mouth in the morning, at noon, and at bedtime. 7 DS   blood glucose meter kit and supplies Kit Dispense based on patient and insurance preference. Use up to four times daily as directed.   calcitRIOL 0.5 MCG capsule Commonly known as: ROCALTROL Take 1 capsule (0.5 mcg total) by mouth daily.   carvedilol 6.25 MG tablet Commonly known as: COREG Take 1 tablet (6.25 mg total) by mouth 2 (two) times daily with a meal.   clonazePAM 2 MG tablet Commonly known as: KLONOPIN Take 1 tablet (2 mg total) by mouth 3 (three) times daily. for anxiety What changed: additional instructions   cyclobenzaprine 10 MG tablet Commonly known as: FLEXERIL Take 10 mg by mouth 3 (three) times daily.   diclofenac sodium 1 % Gel Commonly known as: VOLTAREN Apply 2 g topically 4 (four) times daily as needed (for pain).   EPINEPHrine 0.3 mg/0.3 mL Soaj injection Commonly known as: EPI-PEN Inject 0.3 mg into the muscle once as needed for anaphylaxis.   FeroSul 325 (65 FE) MG tablet Generic drug: ferrous sulfate Take 325 mg by mouth daily.   glucose blood test strip Use as instructed to check blood sugar up to 4 times daily What changed:  how much to take how to take this when to take this   guaiFENesin 600 MG 12 hr tablet Commonly known as: MUCINEX Take 2 tablets (1,200 mg total) by mouth 2 (two) times daily.   ipratropium-albuterol 0.5-2.5 (3) MG/3ML Soln Commonly known as: DUONEB Take 3 mLs by nebulization 4 (four) times daily.   levothyroxine 112 MCG tablet Commonly known as: SYNTHROID Take 224 mcg by mouth daily before breakfast.   lisinopril 5 MG tablet Commonly known as: ZESTRIL Take 1 tablet (5 mg total) by mouth daily. Please call for office visit [phone] What changed: additional instructions   loperamide 2 MG capsule Commonly known as: IMODIUM Take 1 capsule (2 mg total) by mouth 4 (four) times daily as  needed for diarrhea or loose stools.   metFORMIN 500 MG tablet Commonly known as: Glucophage Take 1 tablet (500 mg total) by mouth 2 (two) times daily with a meal.   nitroGLYCERIN 0.4 MG SL tablet Commonly known as: NITROSTAT Place 1 tablet (0.4 mg total) under the tongue every 5 (five) minutes as needed for chest pain.   nystatin cream Commonly known as: MYCOSTATIN Apply 1 application topically 2 (two) times daily as needed for dry skin.   nystatin ointment Commonly known as: MYCOSTATIN Apply 1 application topically 2 (two) times daily.   omeprazole 20 MG capsule Commonly known as: PRILOSEC Take 20 mg by mouth 2 (two) times daily before a meal.   oxyCODONE-acetaminophen 10-325 MG tablet Commonly known as: PERCOCET Take 1 tablet by mouth every 6 (six) hours as needed for pain.   OXYGEN Inhale 2-3 L into the lungs continuous.   polyethylene glycol 17 g packet Commonly known as: MIRALAX / GLYCOLAX Take 17 g by mouth daily as needed for mild constipation.   potassium chloride SA 20 MEQ tablet Commonly known as: KLOR-CON Take 1 tablet (20 mEq total) by mouth 2 (two) times daily.   predniSONE 20 MG  tablet Commonly known as: DELTASONE Take 2 tablets (40 mg total) by mouth daily.   rosuvastatin 10 MG tablet Commonly known as: CRESTOR Take 10 mg by mouth every evening.   Senna-Plus 8.6-50 MG tablet Generic drug: senna-docusate Take 1 tablet by mouth daily.   torsemide 20 MG tablet Commonly known as: DEMADEX Take 2 tablets (40 mg total) by mouth 2 (two) times daily.   traZODone 50 MG tablet Commonly known as: DESYREL Take 100 mg by mouth at bedtime.   triamcinolone cream 0.1 % Commonly known as: KENALOG Apply 1 application topically 2 (two) times daily.        Allergies  Allergen Reactions   Bee Venom Swelling and Other (See Comments)    "All over my body" (swelling)   Fioricet [Butalbital-Apap-Caffeine] Nausea And Vomiting and Rash   Ibuprofen Rash and  Other (See Comments)    Severe rash   Lamisil [Terbinafine] Rash and Other (See Comments)    Pt states this causes her to "feel funny"   Nsaids Other (See Comments)    Per MD's orders     Consultations: None   Procedures/Studies: DG Chest Port 1 View  Result Date: 12/23/2020 CLINICAL DATA:  Weakness. EXAM: PORTABLE CHEST 1 VIEW COMPARISON:  December 09, 2020. FINDINGS: Stable cardiomegaly. Mild bibasilar subsegmental atelectasis or infiltrates are noted. Bony thorax is unremarkable. IMPRESSION: Mild bibasilar subsegmental atelectasis or infiltrates. Electronically Signed   By: Marijo Conception M.D.   On: 12/23/2020 17:56   DG Chest Port 1 View  Result Date: 12/09/2020 CLINICAL DATA:  Tachycardia, SVT EXAM: PORTABLE CHEST 1 VIEW COMPARISON:  11/10/2020 FINDINGS: Lingular/left lower lobe scarring. Right lung is clear. No pleural effusion or pneumothorax. The heart is normal in size.  Thoracic aortic atherosclerosis. Surgical clips overlying the neck. IMPRESSION: No evidence of acute cardiopulmonary disease. Electronically Signed   By: Julian Hy M.D.   On: 12/09/2020 21:15     Subjective: No acute issues or events overnight denies nausea vomiting diarrhea constipation headache fevers chills or chest pain   Discharge Exam: Vitals:   12/25/20 0800 12/25/20 0852  BP: (!) 151/79   Pulse: 77   Resp: (!) 23   Temp:    SpO2: 99% 99%   Vitals:   12/25/20 0409 12/25/20 0730 12/25/20 0800 12/25/20 0852  BP:   (!) 151/79   Pulse:   77   Resp:   (!) 23   Temp: 98.4 F (36.9 C) 98.6 F (37 C)    TempSrc: Oral Oral    SpO2:   99% 99%  Weight:      Height:        General: Pt is alert, awake, not in acute distress Cardiovascular: RRR, S1/S2 +, no rubs, no gallops Respiratory: CTA bilaterally, no wheezing, no rhonchi Abdominal: Soft, NT, ND, bowel sounds + Extremities: no edema, no cyanosis    The results of significant diagnostics from this hospitalization (including  imaging, microbiology, ancillary and laboratory) are listed below for reference.     Microbiology: Recent Results (from the past 240 hour(s))  Resp Panel by RT-PCR (Flu A&B, Covid) Nasopharyngeal Swab     Status: None   Collection Time: 12/23/20  6:20 PM   Specimen: Nasopharyngeal Swab; Nasopharyngeal(NP) swabs in vial transport medium  Result Value Ref Range Status   SARS Coronavirus 2 by RT PCR NEGATIVE NEGATIVE Final    Comment: (NOTE) SARS-CoV-2 target nucleic acids are NOT DETECTED.  The SARS-CoV-2 RNA is generally detectable in upper  respiratory specimens during the acute phase of infection. The lowest concentration of SARS-CoV-2 viral copies this assay can detect is 138 copies/mL. A negative result does not preclude SARS-Cov-2 infection and should not be used as the sole basis for treatment or other patient management decisions. A negative result may occur with  improper specimen collection/handling, submission of specimen other than nasopharyngeal swab, presence of viral mutation(s) within the areas targeted by this assay, and inadequate number of viral copies(<138 copies/mL). A negative result must be combined with clinical observations, patient history, and epidemiological information. The expected result is Negative.  Fact Sheet for Patients:  EntrepreneurPulse.com.au  Fact Sheet for Healthcare Providers:  IncredibleEmployment.be  This test is no t yet approved or cleared by the [place] FDA and  has been authorized for detection and/or diagnosis of SARS-CoV-2 by FDA under an Emergency Use Authorization (EUA). This EUA will remain  in effect (meaning this test can be used) for the duration of the COVID-19 declaration under Section 564(b)(1) of the Act, 21 U.S.C.section 360bbb-3(b)(1), unless the authorization is terminated  or revoked sooner.       Influenza A by PCR NEGATIVE NEGATIVE Final   Influenza B by PCR NEGATIVE  NEGATIVE Final    Comment: (NOTE) The Xpert Xpress SARS-CoV-2/FLU/RSV plus assay is intended as an aid in the diagnosis of influenza from Nasopharyngeal swab specimens and should not be used as a sole basis for treatment. Nasal washings and aspirates are unacceptable for Xpert Xpress SARS-CoV-2/FLU/RSV testing.  Fact Sheet for Patients: EntrepreneurPulse.com.au  Fact Sheet for Healthcare Providers: IncredibleEmployment.be  This test is not yet approved or cleared by the [place] FDA and has been authorized for detection and/or diagnosis of SARS-CoV-2 by FDA under an Emergency Use Authorization (EUA). This EUA will remain in effect (meaning this test can be used) for the duration of the COVID-19 declaration under Section 564(b)(1) of the Act, 21 U.S.C. section 360bbb-3(b)(1), unless the authorization is terminated or revoked.  Performed at [facility], [place] [address], [place], [place] [address]   MRSA Next Gen by PCR, Nasal     Status: None   Collection Time: 12/23/20 10:10 PM   Specimen: Nasal Mucosa; Nasal Swab  Result Value Ref Range Status   MRSA by PCR Next Gen NOT DETECTED NOT DETECTED Final    Comment: (NOTE) The GeneXpert MRSA Assay (FDA approved for NASAL specimens only), is one component of a comprehensive MRSA colonization surveillance program. It is not intended to diagnose MRSA infection nor to guide or monitor treatment for MRSA infections. Test performance is not FDA approved in patients less than 63 years old. Performed at [facility], [place] [address], [place], [place] [address]      Labs: BNP (last 3 results) Recent Labs    08/13/20 1845 11/11/20 0000 12/23/20 1534  BNP 39.4 14.4 08.6   Basic Metabolic Panel: Recent Labs  Lab 12/23/20 1533 12/24/20 0253  NA 139 141  K 4.6 3.8  CL 95* 96*  CO2 33* 31  GLUCOSE 100* 176*  BUN 21* 16  CREATININE 1.03* 0.65  CALCIUM  8.8* 8.9  MG 2.4 2.4  PHOS  --  3.0   Liver Function Tests: Recent Labs  Lab 12/23/20 1533 12/24/20 0253  AST 31 27  ALT 23 24  ALKPHOS 87 97  BILITOT 0.3 0.3  PROT 7.3 8.0  ALBUMIN 3.8 4.0   No results for input(s): LIPASE, AMYLASE in the last 168 hours. No results  for input(s): AMMONIA in the last 168 hours. CBC: Recent Labs  Lab 12/23/20 1533 12/24/20 0253  WBC 13.8* 13.4*  NEUTROABS 9.8* 12.5*  HGB 10.6* 11.4*  HCT 35.7* 38.1  MCV 98.3 96.7  PLT 254 279   Cardiac Enzymes: No results for input(s): CKTOTAL, CKMB, CKMBINDEX, TROPONINI in the last 168 hours. BNP: Invalid input(s): POCBNP CBG: Recent Labs  Lab 12/24/20 1942 12/24/20 2333 12/25/20 0343 12/25/20 0746 12/25/20 1127  GLUCAP 128* 175* 116* 112* 133*   D-Dimer No results for input(s): DDIMER in the last 72 hours. Hgb A1c Recent Labs    12/23/20 2202  HGBA1C 6.2*   Lipid Profile No results for input(s): CHOL, HDL, LDLCALC, TRIG, CHOLHDL, LDLDIRECT in the last 72 hours. Thyroid function studies Recent Labs    12/24/20 0253  TSH 0.800   Anemia work up Recent Labs    12/24/20 0253  VITAMINB12 544  FOLATE 8.5  FERRITIN 113  TIBC 368  IRON 42  RETICCTPCT 3.0   Urinalysis    Component Value Date/Time   COLORURINE YELLOW 12/24/2020 0310   APPEARANCEUR CLEAR 12/24/2020 0310   LABSPEC 1.014 12/24/2020 0310   PHURINE 7.0 12/24/2020 0310   GLUCOSEU NEGATIVE 12/24/2020 0310   HGBUR NEGATIVE 12/24/2020 0310   BILIRUBINUR NEGATIVE 12/24/2020 0310   KETONESUR NEGATIVE 12/24/2020 0310   PROTEINUR NEGATIVE 12/24/2020 0310   UROBILINOGEN 0.2 02/28/2012 2004   NITRITE NEGATIVE 12/24/2020 0310   LEUKOCYTESUR NEGATIVE 12/24/2020 0310   Sepsis Labs Invalid input(s): PROCALCITONIN,  WBC,  LACTICIDVEN Microbiology Recent Results (from the past 240 hour(s))  Resp Panel by RT-PCR (Flu A&B, Covid) Nasopharyngeal Swab     Status: None   Collection Time: 12/23/20  6:20 PM   Specimen:  Nasopharyngeal Swab; Nasopharyngeal(NP) swabs in vial transport medium  Result Value Ref Range Status   SARS Coronavirus 2 by RT PCR NEGATIVE NEGATIVE Final    Comment: (NOTE) SARS-CoV-2 target nucleic acids are NOT DETECTED.  The SARS-CoV-2 RNA is generally detectable in upper respiratory specimens during the acute phase of infection. The lowest concentration of SARS-CoV-2 viral copies this assay can detect is 138 copies/mL. A negative result does not preclude SARS-Cov-2 infection and should not be used as the sole basis for treatment or other patient management decisions. A negative result may occur with  improper specimen collection/handling, submission of specimen other than nasopharyngeal swab, presence of viral mutation(s) within the areas targeted by this assay, and inadequate number of viral copies(<138 copies/mL). A negative result must be combined with clinical observations, patient history, and epidemiological information. The expected result is Negative.  Fact Sheet for Patients:  EntrepreneurPulse.com.au  Fact Sheet for Healthcare Providers:  IncredibleEmployment.be  This test is no t yet approved or cleared by the [place] FDA and  has been authorized for detection and/or diagnosis of SARS-CoV-2 by FDA under an Emergency Use Authorization (EUA). This EUA will remain  in effect (meaning this test can be used) for the duration of the COVID-19 declaration under Section 564(b)(1) of the Act, 21 U.S.C.section 360bbb-3(b)(1), unless the authorization is terminated  or revoked sooner.       Influenza A by PCR NEGATIVE NEGATIVE Final   Influenza B by PCR NEGATIVE NEGATIVE Final    Comment: (NOTE) The Xpert Xpress SARS-CoV-2/FLU/RSV plus assay is intended as an aid in the diagnosis of influenza from Nasopharyngeal swab specimens and should not be used as a sole basis for treatment. Nasal washings and aspirates are unacceptable for  Xpert Xpress  SARS-CoV-2/FLU/RSV testing.  Fact Sheet for Patients: EntrepreneurPulse.com.au  Fact Sheet for Healthcare Providers: IncredibleEmployment.be  This test is not yet approved or cleared by the [place] FDA and has been authorized for detection and/or diagnosis of SARS-CoV-2 by FDA under an Emergency Use Authorization (EUA). This EUA will remain in effect (meaning this test can be used) for the duration of the COVID-19 declaration under Section 564(b)(1) of the Act, 21 U.S.C. section 360bbb-3(b)(1), unless the authorization is terminated or revoked.  Performed at [facility], [place] [address] [place], [place] [address]   MRSA Next Gen by PCR, Nasal     Status: None   Collection Time: 12/23/20 10:10 PM   Specimen: Nasal Mucosa; Nasal Swab  Result Value Ref Range Status   MRSA by PCR Next Gen NOT DETECTED NOT DETECTED Final    Comment: (NOTE) The GeneXpert MRSA Assay (FDA approved for NASAL specimens only), is one component of a comprehensive MRSA colonization surveillance program. It is not intended to diagnose MRSA infection nor to guide or monitor treatment for MRSA infections. Test performance is not FDA approved in patients less than 50 years old. Performed at [facility], [place] [address], [place], [place] [address]      Time coordinating discharge: Over 30 minutes  SIGNED:   Little Ishikawa, DO [facility] 12/25/2020, 11:49 AM Pager   If 7PM-7AM, please contact night-coverage [email or web address]

## 2020-12-25 NOTE — Progress Notes (Signed)
Discharge instructions provided to and discussed with pt. CM arranged for shower chair to come to pt's house. IV removed. Pt provided with cab voucher. Pt taken to main entrance via wheelchair by NT at 1330 to meet cab.

## 2020-12-25 NOTE — TOC Transition Note (Signed)
Transition of Care [facility]) - CM/SW Discharge Note   Patient Details  Name: Michele Owens MRN: [id number] Date of Birth: 1960/09/25  Transition of Care (TOC) CM/SW Contact:  Joaquin Courts, RN Phone Number: 12/25/2020, 3:12 PM   Clinical Narrative:   Patient reports she is in need of shower stool, states she wishes for a larger sized item to accommodate body habitus. Referral given to [employer] rep Jazmine for DME tub bench to accommodate requested sizing.  Plan to ship item to home.     Final next level of care: Home/Self Care Barriers to Discharge: Continued Medical Work up   Patient Goals and CMS Choice Patient states their goals for this hospitalization and ongoing recovery are:: to go home CMS Medicare.gov Compare Post Acute Care list provided to:: Patient Choice offered to / list presented to : Patient  Discharge Placement                       Discharge Plan and Services   Discharge Planning Services: CM Consult            DME Arranged: Tub bench DME Agency: [employer] Date DME Agency Contacted: 12/25/20 Time DME Agency Contacted: [phone] Representative spoke with at DME Agency: [place] Interventions     Readmission Risk Interventions Readmission Risk Prevention Plan 07/05/2018  Transportation Screening Complete  Medication Review (RN Care Manager) Referral to [place] Not Applicable  Some recent data might be hidden

## 2020-12-27 ENCOUNTER — Other Ambulatory Visit: Payer: Self-pay

## 2020-12-27 ENCOUNTER — Other Ambulatory Visit: Payer: Self-pay | Admitting: Obstetrics and Gynecology

## 2020-12-27 DIAGNOSIS — I110 Hypertensive heart disease with heart failure: Secondary | ICD-10-CM | POA: Diagnosis not present

## 2020-12-27 DIAGNOSIS — R531 Weakness: Secondary | ICD-10-CM | POA: Diagnosis not present

## 2020-12-27 NOTE — Patient Outreach (Signed)
Medicaid Managed Care   Nurse Care Manager Note  12/27/2020 Name:  Michele Owens MRN:  [id number] DOB:  Aug 22, 1960  Michele Owens is an 60 y.o. year old female who is Owens primary patient of Michele Reach, MD.  The [facility] Managed Care Coordination team was consulted for assistance with:    Chronic healthcare management needs.  Michele Owens was given information about Medicaid Managed Care Coordination team services today. Michele Owens Patient agreed to services and verbal consent obtained.  Engaged with patient by telephone for follow up visit in response to provider referral for case management and/or care coordination services.   Assessments/Interventions:  Review of past medical history, allergies, medications, health status, including review of consultants reports, laboratory and other test data, was performed as part of comprehensive evaluation and provision of chronic care management services.  SDOH (Social Determinants of Health) assessments and interventions performed:   Care Plan  Allergies  Allergen Reactions   Bee Venom Swelling and Other (See Comments)    "All over my body" (swelling)   Fioricet [Butalbital-Apap-Caffeine] Nausea And Vomiting and Rash   Ibuprofen Rash and Other (See Comments)    Severe rash   Lamisil [Terbinafine] Rash and Other (See Comments)    Pt states this causes her to "feel funny"   Nsaids Other (See Comments)    Per MD's orders     Medications Reviewed Today     Reviewed by Michele Medicus, RN (Registered Nurse) on 12/27/20 at [phone]  Med List Status: <None>   Medication Order Taking? Sig Documenting Provider Last Dose Status Informant  albuterol (PROVENTIL HFA;VENTOLIN HFA) 108 (90 Base) MCG/ACT inhaler [id number] No Inhale 1-2 puffs into the lungs every 6 (six) hours as needed for wheezing or shortness of breath. Michele Fast, MD 08/13/2020 Unknown time Active Self           Med Note Michele Owens   Fri Dec 24, 2020 10:42 AM) Michele Owens 12/04/20  [facility] per external pharmacy records  allopurinol (ZYLOPRIM) 100 MG tablet [id number] No Take 100 mg by mouth daily. [provider] 08/13/2020 Unknown time Active Self           Med Note Michele Owens, [facility]   Fri Dec 24, 2020 10:43 AM) LF 12/17/20 [facility] per external pharmacy records  benzonatate (TESSALON) 200 MG capsule [id number] No Take 1 capsule (200 mg total) by mouth in the morning, at Owens, and at bedtime. 7 DS Michele Shiley, MD 08/13/2020 Unknown time Active Self           Med Note Michele Owens   Fri Dec 24, 2020 10:43 AM) Michele Owens 12/04/20 [facility] per external pharmacy records  blood glucose meter kit and supplies KIT [id number]  Dispense based on patient and insurance preference. Use up to four times daily as directed. Michele Ishikawa, MD  Active   calcitRIOL (ROCALTROL) 0.5 MCG capsule [id number] No Take 1 capsule (0.5 mcg total) by mouth daily. Michele Jansky, MD 08/13/2020 Unknown time Active Self           Med Note Michele Owens   Fri Dec 24, 2020 10:43 AM) LF 12/17/20 [facility] per external pharmacy records  carvedilol (COREG) 6.25 MG tablet [id number] No Take 1 tablet (6.25 mg total) by mouth 2 (two) times daily with Owens meal. Owens, Michele Pascal, MD 08/13/2020 3 am Active Self           Med Note Michele Owens   Fri Dec 24, 2020  10:44 AM) LF 12/17/20 [facility] per external pharmacy records  clonazePAM [facility]) 2 MG tablet [id number] No Take 1 tablet (2 mg total) by mouth 3 (three) times daily. for anxiety  Patient taking differently: Take 2 mg by mouth 3 (three) times daily.   Michele Pence, MD 08/13/2020 Unknown time Active            Med Note Michele Owens   Fri Dec 24, 2020 10:44 AM) LF 12/17/20 [facility] per external pharmacy records  cyclobenzaprine (FLEXERIL) 10 MG tablet [id number] No Take 10 mg by mouth 3 (three) times daily. [provider] 08/13/2020 Unknown time Active Self           Med Note Michele Owens   Fri Dec 24, 2020  10:44 AM) LF 12/17/20 [facility] per external pharmacy records  diclofenac sodium (VOLTAREN) 1 % GEL [id number] No Apply 2 g topically 4 (four) times daily as needed (for pain).  Patient not taking: No sig reported   Michele Morton, MD Not Taking Unknown time Active Self  EPINEPHrine 0.3 mg/0.3 mL IJ SOAJ injection [id number] No Inject 0.3 mg into the muscle once as needed for anaphylaxis. [provider] unk last dose Active Self           Med Note Michele Owens Oct 04, 2020  8:41 PM) LF 07/20/2020 [facility] per external pharmacy records   FEROSUL 325 (65 Fe) MG tablet [id number] No Take 325 mg by mouth daily. [provider] 08/13/2020 Unknown time Active Self           Med Note Michele Owens, Michele Owens   Fri Dec 24, 2020 10:45 AM) LF 12/17/20 [facility] per external pharmacy records  glucose blood test strip [id number] No Use as instructed to check blood sugar up to 4 times daily  Patient taking differently: 1 each by Other route See admin instructions. Use as instructed to check blood sugar up to 4 times daily   Michele Phi, DO unk unk Active Self  guaiFENesin (MUCINEX) 600 MG 12 hr tablet [id number] No Take 2 tablets (1,200 mg total) by mouth 2 (two) times daily. Owens, Michele A, MD 08/13/2020 Unknown time Active Self  ipratropium-albuterol (DUONEB) 0.5-2.5 (3) MG/3ML SOLN [id number] No Take 3 mLs by nebulization 4 (four) times daily.  [provider] 08/12/2020 Unknown time Active Self           Med Note Michele Owens Oct 04, 2020  8:43 PM) LF 05/17/2020 [facility] per external pharmacy records   levothyroxine (SYNTHROID, LEVOTHROID) 112 MCG tablet [id number] No Take 224 mcg by mouth daily before breakfast. [provider] 08/13/2020 Unknown time Active Self           Med Note Michele Owens, Michele Owens   Fri Dec 24, 2020 10:45 AM) LF 12/17/20 [facility] per external pharmacy records  lisinopril (PRINIVIL,ZESTRIL) 5 MG tablet [id number] No Take 1  tablet (5 mg total) by mouth daily. Please call for office visit [phone]  Patient taking differently: Take 5 mg by mouth daily.   [name], MD 08/13/2020 Unknown time Active            Med Note Michele Owens   Fri Dec 24, 2020 10:45 AM) Michele Owens 12/17/20 [facility] per external pharmacy records  loperamide (IMODIUM) 2 MG capsule [id number] No Take 1 capsule (2 mg total) by mouth 4 (four) times daily as needed for diarrhea or loose stools. [name], MD Past  Month Unknown time Active Self           Med Note Michele Owens   Fri Dec 24, 2020 10:46 AM) LF 11/29/20 [facility] per external pharmacy records  metFORMIN (GLUCOPHAGE) 500 MG tablet [id number] No Take 1 tablet (500 mg total) by mouth 2 (two) times daily with Owens meal. [name], MD 08/13/2020 Unknown time Active Self           Med Note Michele Owens   Fri Dec 24, 2020 10:49 AM) LF 12/17/20 [facility] per external pharmacy records  nitroGLYCERIN (NITROSTAT) 0.4 MG SL tablet [id number] No Place 1 tablet (0.4 mg total) under the tongue every 5 (five) minutes as needed for chest pain. Michele Morton, MD unk last dose Active Self           Med Note Michele Owens Oct 04, 2020  8:43 PM) LF 04/28/2020 [facility] per external pharmacy records   nystatin cream (MYCOSTATIN) [id number] No Apply 1 application topically 2 (two) times daily as needed for dry skin. Michele Shiley, MD Past Week Unknown time Active Self           Med Note Michele Owens   Fri Dec 24, 2020 10:46 AM) Michele Owens 09/10/20 [facility] per external pharmacy records  nystatin ointment (MYCOSTATIN) [id number]  Apply 1 application topically 2 (two) times daily. [provider]  Active            Med Note Michele Owens Oct 04, 2020  8:35 PM) LF 09/10/2020 [facility] per external pharmacy records   omeprazole (PRILOSEC) 20 MG capsule [id number] No Take 20 mg by mouth 2 (two) times daily before Owens meal. [provider]  08/13/2020 Unknown time Active Self           Med Note Michele Owens   Fri Dec 24, 2020 10:47 AM) LF 11/16/20 [facility] per external pharmacy records  oxyCODONE-acetaminophen (PERCOCET) 10-325 MG tablet [id number] No Take 1 tablet by mouth every 6 (six) hours as needed for pain. [provider] 08/13/2020 Unknown time Active Self           Med Note Michele Owens   Fri Dec 24, 2020 10:47 AM) Michele Owens 12/10/20 [facility] per external pharmacy records  OXYGEN [id number] No Inhale 2-3 L into the lungs continuous. [provider] 08/13/2020 Unknown time Active Self           Med Note Dorina Hoyer, [facility]   Mon Apr 12, 2020 10:08 AM)    oxymetazoline (AFRIN NASAL SPRAY) 0.05 % nasal spray [id number]  Place 1 spray into both nostrils 2 (two) times daily. Pearson Grippe, DO  Active   polyethylene glycol (MIRALAX / GLYCOLAX) 17 g packet [id number] No Take 17 g by mouth daily as needed for mild constipation. [provider] Past Week Unknown time Active Self           Med Note Orvan Seen, [facility]   Mon Oct 04, 2020  8:30 PM) 238 g filled 09/21/2020 [facility] per external pharmacy records   potassium chloride SA (K-DUR,KLOR-CON) 20 MEQ tablet [id number] No Take 1 tablet (20 mEq total) by mouth 2 (two) times daily. [name], MD 08/13/2020 Unknown time Active Self           Med Note Michele Owens   Fri Dec 24, 2020 10:48 AM) Michele Owens 12/17/20 [facility] per external pharmacy records  predniSONE (DELTASONE) 20 MG tablet  [id number]  Take 2 tablets (40 mg total) by mouth daily. Horton, Barbette Hair, MD  Active   rosuvastatin (CRESTOR) 10 MG tablet [id number] No Take 10 mg by mouth every evening. [provider] 08/13/2020 Unknown time Active Self           Med Note Michele Owens   Fri Dec 24, 2020 10:48 AM) LF 12/17/20 [facility] per external pharmacy records  senna-docusate (SENNA-PLUS) 8.6-50 MG tablet [id number]  Take 1 tablet by mouth daily. [provider]  Active             Med Note Michele Owens   Fri Dec 24, 2020 10:48 AM) LF 12/17/20 [facility] per external pharmacy records  torsemide (DEMADEX) 20 MG tablet [id number]  Take 2 tablets (40 mg total) by mouth 2 (two) times daily. [name], MD  Active            Med Note Michele Owens   Fri Dec 24, 2020 10:48 AM) Michele Owens 12/17/20 [facility] per external pharmacy records  traZODone (DESYREL) 50 MG tablet [id number] No Take 100 mg by mouth at bedtime. [provider] 08/12/2020 Unknown time Active Self           Med Note Michele Owens   Fri Dec 24, 2020 10:49 AM) LF 12/17/20 [facility] per external pharmacy records  triamcinolone cream (KENALOG) 0.1 % [id number]  Apply 1 application topically 2 (two) times daily. [provider]  Active            Med Note Michele Owens Oct 04, 2020  8:40 PM) LF 09/10/2020 [facility] per external pharmacy records   umeclidinium-vilanterol [facility] ELLIPTA) 62.5-25 MCG/INH AEPB [id number]  Inhale 1 puff into the lungs daily. [provider]  Active            Med Note Michele Owens   Fri Dec 24, 2020 10:49 AM) LF 12/04/20 [facility] per external pharmacy records            Patient Active Problem List   Diagnosis Date Noted   Respiratory failure with hypercapnia ([place]) 12/23/2020   Anemia 12/23/2020   Acute on chronic respiratory failure with hypoxia and hypercapnia ([place]) 04/18/2020   Acute on chronic respiratory failure with hypoxia ([place]) 04/18/2020   Hemoptysis 70/04/7492   Acute metabolic encephalopathy 49/67/5916   Acute encephalopathy 04/18/2020   Chronic diastolic CHF (congestive heart failure) ([place]) 04/18/2020   Sepsis ([place]) 04/18/2020   Morbid obesity with BMI of 50.0-59.9, adult ([place])    Fall at home, initial encounter 06/23/2018   HNP (herniated nucleus pulposus), thoracic 06/23/2018   CHF (congestive heart failure) ([place]) 06/18/2018   Hypertensive disorder 08/28/2017   Obesity 08/28/2017   COPD  exacerbation ([place]) 08/04/2017   Acute and chronic respiratory failure with hypercapnia (HCC)    Pulmonary HTN ([place])    Genetic testing 02/27/2017   Family history of thyroid cancer    Hypomagnesemia 10/03/2016   Atypical chest pain    HLD (hyperlipidemia) 09/23/2016   Gout 09/23/2016   DVT (deep vein thrombosis) in pregnancy 09/23/2016   Thyroid cancer ([place]) 09/23/2016   Hypocalcemia 09/23/2016   AKI (acute kidney injury) ([place]) 09/23/2016   Acute pulmonary edema (HCC)    Acute hypoxemic respiratory failure (HCC)    Ventilator dependent (HCC)    Chronic respiratory failure with hypoxia and hypercapnia ([place]) 07/16/2016   CAP (community acquired pneumonia) 01/23/2016   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  01/23/2016   Pulmonary hypertension ([place]) 01/23/2016   Obesity hypoventilation syndrome ([place]) 08/26/2015   Dyslipidemia associated with type 2 diabetes mellitus ([place]) 04/24/2015   Leukocytosis 04/24/2015   Controlled type 2 diabetes mellitus without complication, without long-term current use of insulin ([place]) 04/24/2015   Anxiety 04/24/2015   Benign essential HTN 04/24/2015   Normocytic anemia 04/24/2015   OSA (obstructive sleep apnea) 05/10/2012   Tobacco abuse 07/04/2011    Conditions to be addressed/monitored per PCP order:   chronic healthcare management needs,  significant of morbid obesity, COPD on 2-3L at baseline, chronic diastolic heart failure, hypertension, chronic pain, hypothyroidism, gout, DM2, anxiety, HLD  Care Plan : General Plan of Care (Adult)  Updates made by Michele Medicus, RN since 12/27/2020 12:00 AM     Problem: Barriers to Treatment   Priority: High  Onset Date: 04/23/2020     Long-Range Goal: Barriers to Treatment Identified and Managed   Start Date: 05/03/2020  Expected End Date: 02/26/2021  Recent Progress: On track  Priority: Medium  Note:   CARE PLAN ENTRY Medicaid Managed Care (see longitudinal plan of care for additional  care plan information)  Current Barriers:  Transportation barriers-patient utilizing [facility] and [employer] as needed. Limited access to caregiver-patient needs PCS, currently not receiving services she is expecting. Patient choosing not to use aides provided to her-discussed finding another agency with patient-using [facility].  Nurse Clinical Goal(s):  Over the next 30 days, patient will work with [employer] to address needs related to transportation. Update 05/12/20:  Patient has used [employer] and is very pleased. Over the next 90 days, patient will attend all scheduled medical appointments. RNCM will make referral to SW for assistance with PCS Update 05/20/20:  Patient states she is now receiving PCS from [facility] 3 days Owens week for 4 hours each day. Update 05/27/20: BSW spoke with patient about PCS, she stated someone usually comes in 3 days Owens week for Owens few hours. She stated her aide was off today. BSW contacted [place] and spoke with [facility], she stated that patient has 13 hours per week with 6 days Owens week, however patient chooses to have an aide come out 3 days Owens week so that she can have more hours. The aide assist with most ADL's. Update 06/04/20: Patient stated she is ready to move to [facility]. She has already started the process but wants to save some money first and she has to wait on her nephew who will drive the uhaul truck. Update 06/24/20: BSW contacted patient to follow up on her moving to [facility], she stated she is still saving money. Patient did cut call short because she was sleeping, patient stated she was not in need of any resources at this time. Update 08/27/20: Bsw received information from [facility] about patient needing some batteries for her remote. BSW contacted patient to see if any resources were needed. Patient stated she contacted [employer] and they informed her it would cost 40 dollars to  replace her remote, patient states she only needs batteries. BSW asked if patient had anyone to call that could bring her some batteries, patient stated no. She states her daughter is mad at her right now and working so she does not talk to her, patient states she has not family to call. BSW asked if her aide could help, patient states she has not had an aide in about Owens week, she had to let  her last aide go because she smelled like marijuana and they have not replaced her. BSW contacted patients healthcare agency [facility] at [phone] and left Owens voicemail. Update 09/30/20:  Patient has aides available to her at [facility] but chooses not to use them. Discussion with [facility] and they continue to look for another aide for Ms. Supak. Discussed with patient about her finding Owens new agency. 10/05/20: BSW received Owens telephone call from [facility] with patient on the line. Patient stated that she went to the ED and her eye was still hurting from Owens fall. Patient stated she has the information for her eye doctor and was getting ready to contact them. Patient stated she has not recieieved an aide since last Thursday.  Update 10/26/20:  Patient now has aide coming M-F 10-12, "Lucy" Update 11/26/20:  Patient with no aide right now-has chosen not to utilize aides offered to her. Update 12/27/20:  Patient currently using [facility]?  Interventions: Inter-disciplinary care team collaboration (see longitudinal plan of care) Referred patient to community resources care guide team for assistance with transportation. Provided patient with information about [facility] and [employer]. Discussed plans with patient for ongoing care management follow up and provided patient with direct contact information for care management team Advised patient to call [place] or [employer]. Collaborated with [employer] and [place] regarding transportation. Assisted  patient/caregiver with obtaining information about health plan benefits Collaborated with Social Work regarding PCS. Update 08/30/20:  Patient continues to have issues with PCS-SW is investigating. Update 06/14/20:  Patient continues to utilize [employer], aide, [employer] as needed-has missed appointments due to transportation "no shows"-will continue to follow. Update 08/30/20:  patient has been successful in attending appointments with [facility] and [facility] transportation. Update 09/30/20:  Patient has upcoming appointments with [facility] Pharmacist and SW. Patient Self Care Activities:  Patient will call [place]. Update 06/07/20:  Patient continues to utilize [facility] with success. Update 06/14/20:  Patient has missed appointments due to transportation no shows.  [facility] transportation has been utilized as needed. Update 09/30/20:  Patient with no transportation problems right now-uses [employer] or [place] as needed for appointments with success.   Care Plan : General Plan of Care (Adult)  Updates made by Michele Medicus, RN since 12/27/2020 12:00 AM     Problem: Health Promotion or Disease Self-Management (General Plan of Care)   Priority: High  Onset Date: 04/23/2020     Long-Range Goal: Self-Management Plan Developed   Start Date: 04/23/2020  Expected End Date: 02/26/2021  Recent Progress: On track  Priority: Medium  Note:   Current Barriers:  Chronic Disease Management support and education needs.  Patient with abdominal wall infection-seen at [facility] ED 05/24/20 - I&D performed.  Recently hospitalized at [facility] 12/23/20-12/25/20 due to  acute encephalopathy, worsening respiratory status, fatigue weakness and ambulatory dysfunction.  Update 06/01/20:  Patient states she is improving-HHRN is cleaning and dressing infection. Update 06/07/20:  Infection continues to improve per patient, HHRN continues to clean and dress infection  site. Update 06/17/20:  Infection improved, healed. Update 10/26/20:  Patient with recent fall injuring eye and ankle-patient states eye is healing and she is seeing provider today about ankle and knee. Update 11/26/20:  Patient without complaint today, getting Diabetic shoes 8/24. Update 12/27/20:  Patient stable today-she is scheduling follow up appointments.  Nurse Case Manager Clinical Goal(s):  Over the next 30 days, patient will attend all scheduled  medical appointments:  Over the next 30 days, patient will work with CM team pharmacist to review medications. Update 05/20/20:  Patient has appointment with Pharmacist 05/21/20. Update 05/25/20:  Patient declines to speak with Pharmacist at this time. Update 08/30/20:  Patient is working with Pharmacist.  Interventions:  Inter-disciplinary care team collaboration (see longitudinal plan of care) Evaluation of current treatment plan and patient's adherence to plan as established by provider. Reviewed medications with patient. Collaborated with pharmacist regarding medications. Discussed plans with patient for ongoing care management follow up and provided patient with direct contact information for care management team Reviewed scheduled/upcoming provider appointments.  Update 05/20/20:  Patient unable to attend PT appointment 05/19/20, has next appointment 05/26/20.  Has Cardiology appointment 05/28/20.  Reviewed [place] information with patient again and with aide. Update 06/07/20:  Patient has PT appointment 06/09/20. Update 06/17/20:  patient attended PT appointment 06/16/20 and also attended appointment at [facility] this week-patient states she feels better. Pharmacy referral for medication review. Update 05/24/20: Pharmacist has attempted to speak with patient and has been unable to.  Patient at Urgent Care today with stomach pain.  RNCM will reschedule appointment with patient and speak with her regarding importance of speaking  with Pharmacist. Update 05/25/20: Patient declines to speak with Pharmacist at this time. Update 08/30/20:  Patient is working with Pharmacist for medication management. Update 09/30/20:  Patient without complaint today, has upcoming appointments with [facility] Pharmacist and SW.  Patient Goals/Self-Care Activities Over the next 30 days, patient will:  -Attends all scheduled provider appointments Calls pharmacy for medication refills Calls provider office for new concerns or questions  Follow Up Plan: The Managed Medicaid care management team will Owens out to the patient again over the next 30 days.  The patient has been provided with contact information for the Managed Medicaid care management team and has been advised to call with any health related questions or concerns.    Follow Up:  Patient agrees to Care Plan and Follow-up.  Plan: The Managed Medicaid care management team will Owens out to the patient again over the next 30 days. and The patient has been provided with contact information for the Managed Medicaid care management team and has been advised to call with any health related questions or concerns.  Date/time of next scheduled RN care management/care coordination outreach:  01/26/21 at 0900.

## 2020-12-27 NOTE — Patient Instructions (Signed)
Visit Information  Michele Owens was given information about Medicaid Managed Care team care coordination services as a part of their [facility] Medicaid benefit. Michele Owens verbally consented to engagement with the [facility] Managed Care team.   If you are experiencing a medical emergency, please call 911 or report to your local emergency department or urgent care.   If you have a non-emergency medical problem during routine business hours, please contact your provider's office and ask to speak with a nurse.   For questions related to your [facility] health plan, please call: [phone] or go here:[email or web address]  If you would like to schedule transportation through your [facility] plan, please call the following number at least 2 days in advance of your appointment: [phone].  Call the [place] at [phone], at any time, 24 hours a day, 7 days a week. If you are in danger or need immediate medical attention call 911.  If you would like help to quit smoking, call 1-800-QUIT-NOW 7727075881) OR Espaol: 1-855-Djelo-Ya QO:409462) o para ms informacin haga clic aqu or Text READY to 200-400 to register via text  Michele Owens - following are the goals we discussed in your visit today:   Goals Addressed             This Visit's Progress    Protect My Health       Timeframe:  Long-Range Goal Priority:  Medium Start Date:    04/23/20                         Expected End Date: ongoing           Follow Up Date: 01/26/21.   - schedule appointment for vaccines needed due to my age or health - schedule recommended health tests (blood work, mammogram, colonoscopy, pap test) - schedule and keep appointment for annual check-up  Patient has PT appointment 05/19/20. Update 05/20/20:  Patient unable to attend PT appointment on 05/19/20.  Has next PT appointment 05/26/20.  Cardiology appointment 05/28/20. Update 05/24/20:  Attempted to reach  patient today-she is at Urgent Care with stomach pain.  Will reschedule. Update 05/25/20:  Patient seen at Urgent Care and [place] 05/24/20.  Patient with abscess- given antibiotics. Update 06/01/20:  Patient continues to improve, HHRN cleaning and changing dressing. Update 06/07/20:  Patient states abscess continues to improve.  Has PT on Wednesday, 06/09/20. Update 06/14/20:  Abscess healed.  Patient missed PT appointment as transportation did not pick her up-appointment rescheduled to 06/16/20. Update 06/17/20:  Patient attended PT appointment yesterday and also attended appointment at [place] this week-Patient states she feels better. Update 08/24/20:  Patient has appointment at [facility] 08/25/20 at 0930. Update 08/30/20:  Patient without complaints today, awaiting therapy services to start. Update 09/30/20:  Patient without complaint today, friend of family recently died.  Patient has upcoming appointment with Pharmacist and SW. Update 10/26/20:  Patient has appointment today for knee and ankle. Update 11/26/20:  Patient to get diabetic shoes 12/01/20. Update 12/27/20:  Patient hospitalized at [facility] 12/23/20-12/25/20.  Follow up appointment scheduled.   The patient verbalized understanding of instructions provided today and declined a print copy of patient instruction materials.   The Managed Medicaid care management team will reach out to the patient again over the next 30 days.  The  Patient  has been provided with contact information for the Managed Medicaid care management team and has been advised to call with  any health related questions or concerns.   [name] RN, BSN [place]  Tri[facility] Curator - Managed Medicaid High Risk [phone].    Following is a copy of your plan of care:  Patient Care Plan: General Plan of Care (Adult)     Problem Identified: Health Promotion or Disease Self-Management (General Plan of Care)   Priority:  Medium  Onset Date: 04/23/2020     Problem Identified: Barriers to Treatment   Priority: High  Onset Date: 04/23/2020     Long-Range Goal: Barriers to Treatment Identified and Managed   Start Date: 05/03/2020  Expected End Date: 02/26/2021  Recent Progress: On track  Priority: Medium  Note:   CARE PLAN ENTRY Medicaid Managed Care (see longitudinal plan of care for additional care plan information)  Current Barriers:  Transportation barriers-patient utilizing [facility] and [employer] as needed. Limited access to caregiver-patient needs PCS, currently not receiving services she is expecting. Patient choosing not to use aides provided to her-discussed finding another agency with patient-using [facility].  Nurse Clinical Goal(s):  Over the next 30 days, patient will work with [employer] to address needs related to transportation. Update 05/12/20:  Patient has used [employer] and is very pleased. Over the next 90 days, patient will attend all scheduled medical appointments. RNCM will make referral to SW for assistance with PCS Update 05/20/20:  Patient states she is now receiving PCS from [facility] 3 days a week for 4 hours each day. Update 05/27/20: BSW spoke with patient about PCS, she stated someone usually comes in 3 days a week for a few hours. She stated her aide was off today. BSW contacted [place] and spoke with [facility], she stated that patient has 13 hours per week with 6 days a week, however patient chooses to have an aide come out 3 days a week so that she can have more hours. The aide assist with most ADL's. Update 06/04/20: Patient stated she is ready to move to [facility]. She has already started the process but wants to save some money first and she has to wait on her nephew who will drive the uhaul truck. Update 06/24/20: BSW contacted patient to follow up on her moving to [facility], she stated she is  still saving money. Patient did cut call short because she was sleeping, patient stated she was not in need of any resources at this time. Update 08/27/20: Bsw received information from [facility] about patient needing some batteries for her remote. BSW contacted patient to see if any resources were needed. Patient stated she contacted [employer] and they informed her it would cost 40 dollars to replace her remote, patient states she only needs batteries. BSW asked if patient had anyone to call that could bring her some batteries, patient stated no. She states her daughter is mad at her right now and working so she does not talk to her, patient states she has not family to call. BSW asked if her aide could help, patient states she has not had an aide in about a week, she had to let her last aide go because she smelled like marijuana and they have not replaced her. BSW contacted patients healthcare agency [place] at [phone] and left a voicemail. Update 09/30/20:  Patient has aides available to her at [facility] but chooses not to use them. Discussion with [facility] and they continue to look for another aide  for Michele Owens. Discussed with patient about her finding a new agency. 10/05/20: BSW received a telephone call from [facility] with patient on the line. Patient stated that she went to the ED and her eye was still hurting from a fall. Patient stated she has the information for her eye doctor and was getting ready to contact them. Patient stated she has not recieieved an aide since last Thursday.  Update 10/26/20:  Patient now has aide coming M-F 10-12, "Lucy" Update 11/26/20:  Patient with no aide right now-has chosen not to utilize aides offered to her. Update 12/27/20:  Patient currently using [facility]?  Interventions: Inter-disciplinary care team collaboration (see longitudinal plan of care) Referred patient to community resources care guide team for assistance with  transportation. Provided patient with information about [facility] and [employer]. Discussed plans with patient for ongoing care management follow up and provided patient with direct contact information for care management team Advised patient to call [place] or [employer]. Collaborated with [employer] and [place] regarding transportation. Assisted patient/caregiver with obtaining information about health plan benefits Collaborated with Social Work regarding PCS. Update 08/30/20:  Patient continues to have issues with PCS-SW is investigating. Update 06/14/20:  Patient continues to utilize [employer], aide, [employer] as needed-has missed appointments due to transportation "no shows"-will continue to follow. Update 08/30/20:  patient has been successful in attending appointments with [facility] and [facility] transportation. Update 09/30/20:  Patient has upcoming appointments with [facility] Pharmacist and SW. Patient Self Care Activities:  Patient will call [place]. Update 06/07/20:  Patient continues to utilize [facility] with success. Update 06/14/20:  Patient has missed appointments due to transportation no shows.  [facility] transportation has been utilized as needed. Update 09/30/20:  Patient with no transportation problems right now-uses [employer] or [place] as needed for appointments with success.    Patient Care Plan: General Plan of Care (Adult)     Problem Identified: Health Promotion or Disease Self-Management (General Plan of Care)   Priority: High  Onset Date: 04/23/2020     Long-Range Goal: Self-Management Plan Developed   Start Date: 04/23/2020  Expected End Date: 02/26/2021  Recent Progress: On track  Priority: Medium  Note:   Current Barriers:  Chronic Disease Management support and education needs.  Patient with abdominal wall infection-seen at [facility] ED 05/24/20 - I&D performed.   Recently hospitalized at [facility] 12/23/20-12/25/20 due to  acute encephalopathy, worsening respiratory status, fatigue weakness and ambulatory dysfunction.  Update 06/01/20:  Patient states she is improving-HHRN is cleaning and dressing infection. Update 06/07/20:  Infection continues to improve per patient, HHRN continues to clean and dress infection site. Update 06/17/20:  Infection improved, healed. Update 10/26/20:  Patient with recent fall injuring eye and ankle-patient states eye is healing and she is seeing provider today about ankle and knee. Update 11/26/20:  Patient without complaint today, getting Diabetic shoes 8/24. Update 12/27/20:  Patient stable today-she is scheduling follow up appointments.  Nurse Case Manager Clinical Goal(s):  Over the next 30 days, patient will attend all scheduled medical appointments:  Over the next 30 days, patient will work with CM team pharmacist to review medications. Update 05/20/20:  Patient has appointment with Pharmacist 05/21/20. Update 05/25/20:  Patient declines to speak with Pharmacist at this time. Update 08/30/20:  Patient is working with Pharmacist.  Interventions:  Inter-disciplinary care team collaboration (see longitudinal plan of care) Evaluation of current treatment plan and patient's adherence to plan as  established by provider. Reviewed medications with patient. Collaborated with pharmacist regarding medications. Discussed plans with patient for ongoing care management follow up and provided patient with direct contact information for care management team Reviewed scheduled/upcoming provider appointments.  Update 05/20/20:  Patient unable to attend PT appointment 05/19/20, has next appointment 05/26/20.  Has Cardiology appointment 05/28/20.  Reviewed [place] information with patient again and with aide. Update 06/07/20:  Patient has PT appointment 06/09/20. Update 06/17/20:  patient attended PT appointment 06/16/20 and also attended  appointment at [facility] this week-patient states she feels better. Pharmacy referral for medication review. Update 05/24/20: Pharmacist has attempted to speak with patient and has been unable to.  Patient at Urgent Care today with stomach pain.  RNCM will reschedule appointment with patient and speak with her regarding importance of speaking with Pharmacist. Update 05/25/20: Patient declines to speak with Pharmacist at this time. Update 08/30/20:  Patient is working with Pharmacist for medication management. Update 09/30/20:  Patient without complaint today, has upcoming appointments with [facility] Pharmacist and SW.  Patient Goals/Self-Care Activities Over the next 30 days, patient will:  -Attends all scheduled provider appointments Calls pharmacy for medication refills Calls provider office for new concerns or questions  Follow Up Plan: The Managed Medicaid care management team will reach out to the patient again over the next 30 days.  The patient has been provided with contact information for the Managed Medicaid care management team and has been advised to call with any health related questions or concerns.

## 2020-12-28 ENCOUNTER — Other Ambulatory Visit: Payer: Self-pay

## 2020-12-28 ENCOUNTER — Other Ambulatory Visit: Payer: Self-pay | Admitting: Obstetrics and Gynecology

## 2020-12-28 DIAGNOSIS — I110 Hypertensive heart disease with heart failure: Secondary | ICD-10-CM | POA: Diagnosis not present

## 2020-12-28 NOTE — Patient Outreach (Signed)
Care Coordination  12/28/2020  Michele Owens May 02, 1960 [id number]  RNCM returned patient's phone call. Provided active listening and answered questions/provided support.  [name] RN, BSN [place]  [facility] Curator - Managed Medicaid High Risk [phone].

## 2020-12-29 DIAGNOSIS — I110 Hypertensive heart disease with heart failure: Secondary | ICD-10-CM | POA: Diagnosis not present

## 2020-12-30 DIAGNOSIS — Z7689 Persons encountering health services in other specified circumstances: Secondary | ICD-10-CM | POA: Diagnosis not present

## 2020-12-30 DIAGNOSIS — E1140 Type 2 diabetes mellitus with diabetic neuropathy, unspecified: Secondary | ICD-10-CM | POA: Diagnosis not present

## 2020-12-30 DIAGNOSIS — I110 Hypertensive heart disease with heart failure: Secondary | ICD-10-CM | POA: Diagnosis not present

## 2020-12-31 DIAGNOSIS — I110 Hypertensive heart disease with heart failure: Secondary | ICD-10-CM | POA: Diagnosis not present

## 2021-01-03 ENCOUNTER — Ambulatory Visit: Payer: Self-pay

## 2021-01-03 ENCOUNTER — Other Ambulatory Visit: Payer: Self-pay

## 2021-01-03 ENCOUNTER — Other Ambulatory Visit: Payer: Self-pay | Admitting: Obstetrics and Gynecology

## 2021-01-03 DIAGNOSIS — I110 Hypertensive heart disease with heart failure: Secondary | ICD-10-CM | POA: Diagnosis not present

## 2021-01-03 NOTE — Patient Outreach (Signed)
Care Coordination  01/03/2021  Nykerria Macconnell Nov 30, 1960 [id number]  RNCM returned patient's phone call-no answer. Left message.  [name] RN, BSN [place]  Triad Curator - Managed Medicaid High Risk [phone].

## 2021-01-04 ENCOUNTER — Other Ambulatory Visit: Payer: Self-pay

## 2021-01-04 ENCOUNTER — Other Ambulatory Visit: Payer: Self-pay | Admitting: Obstetrics and Gynecology

## 2021-01-04 DIAGNOSIS — I110 Hypertensive heart disease with heart failure: Secondary | ICD-10-CM | POA: Diagnosis not present

## 2021-01-04 NOTE — Patient Outreach (Signed)
Care Coordination  01/04/2021  Michele Owens 01-13-1961 [id number]  RNCM returned patient's phone call-tried 2 separate times to reach patient-no answer, left message.  [name] RN, BSN [place]  Tri[facility] Curator - Managed Medicaid High Risk [phone].

## 2021-01-05 ENCOUNTER — Emergency Department (HOSPITAL_COMMUNITY): Payer: Medicaid Other

## 2021-01-05 ENCOUNTER — Emergency Department (HOSPITAL_COMMUNITY)
Admission: EM | Admit: 2021-01-05 | Discharge: 2021-01-05 | Disposition: A | Discharge status: Home / Self Care | Payer: Medicaid Other | Attending: Emergency Medicine | Admitting: Emergency Medicine

## 2021-01-05 DIAGNOSIS — R000 Tachycardia, unspecified: Secondary | ICD-10-CM | POA: Diagnosis present

## 2021-01-05 DIAGNOSIS — E119 Type 2 diabetes mellitus without complications: Secondary | ICD-10-CM | POA: Insufficient documentation

## 2021-01-05 DIAGNOSIS — I110 Hypertensive heart disease with heart failure: Secondary | ICD-10-CM | POA: Insufficient documentation

## 2021-01-05 DIAGNOSIS — Z7984 Long term (current) use of oral hypoglycemic drugs: Secondary | ICD-10-CM | POA: Diagnosis not present

## 2021-01-05 DIAGNOSIS — J441 Chronic obstructive pulmonary disease with (acute) exacerbation: Secondary | ICD-10-CM | POA: Insufficient documentation

## 2021-01-05 DIAGNOSIS — R079 Chest pain, unspecified: Secondary | ICD-10-CM | POA: Diagnosis not present

## 2021-01-05 DIAGNOSIS — I5032 Chronic diastolic (congestive) heart failure: Secondary | ICD-10-CM | POA: Insufficient documentation

## 2021-01-05 DIAGNOSIS — Z79899 Other long term (current) drug therapy: Secondary | ICD-10-CM | POA: Diagnosis not present

## 2021-01-05 DIAGNOSIS — Z87891 Personal history of nicotine dependence: Secondary | ICD-10-CM | POA: Diagnosis not present

## 2021-01-05 DIAGNOSIS — I499 Cardiac arrhythmia, unspecified: Secondary | ICD-10-CM | POA: Diagnosis not present

## 2021-01-05 DIAGNOSIS — R0789 Other chest pain: Secondary | ICD-10-CM | POA: Diagnosis not present

## 2021-01-05 DIAGNOSIS — J45909 Unspecified asthma, uncomplicated: Secondary | ICD-10-CM | POA: Diagnosis not present

## 2021-01-05 DIAGNOSIS — M25562 Pain in left knee: Secondary | ICD-10-CM | POA: Insufficient documentation

## 2021-01-05 DIAGNOSIS — Z7689 Persons encountering health services in other specified circumstances: Secondary | ICD-10-CM | POA: Diagnosis not present

## 2021-01-05 DIAGNOSIS — Z85850 Personal history of malignant neoplasm of thyroid: Secondary | ICD-10-CM | POA: Diagnosis not present

## 2021-01-05 DIAGNOSIS — Z7951 Long term (current) use of inhaled steroids: Secondary | ICD-10-CM | POA: Insufficient documentation

## 2021-01-05 DIAGNOSIS — I471 Supraventricular tachycardia: Secondary | ICD-10-CM | POA: Diagnosis not present

## 2021-01-05 DIAGNOSIS — Z743 Need for continuous supervision: Secondary | ICD-10-CM | POA: Diagnosis not present

## 2021-01-05 DIAGNOSIS — R6889 Other general symptoms and signs: Secondary | ICD-10-CM | POA: Diagnosis not present

## 2021-01-05 LAB — CBC WITH DIFFERENTIAL/PLATELET
Abs Immature Granulocytes: 0.12 10*3/uL — ABNORMAL HIGH (ref 0.00–0.07)
Basophils Absolute: 0.1 10*3/uL (ref 0.0–0.1)
Basophils Relative: 0 %
Eosinophils Absolute: 0.2 10*3/uL (ref 0.0–0.5)
Eosinophils Relative: 2 %
HCT: 38.4 % (ref 36.0–46.0)
Hemoglobin: 12 g/dL (ref 12.0–15.0)
Immature Granulocytes: 1 %
Lymphocytes Relative: 19 %
Lymphs Abs: 2.8 10*3/uL (ref 0.7–4.0)
MCH: 29.6 pg (ref 26.0–34.0)
MCHC: 31.3 g/dL (ref 30.0–36.0)
MCV: 94.8 fL (ref 80.0–100.0)
Monocytes Absolute: 1 10*3/uL (ref 0.1–1.0)
Monocytes Relative: 7 %
Neutro Abs: 10.4 10*3/uL — ABNORMAL HIGH (ref 1.7–7.7)
Neutrophils Relative %: 71 %
Platelets: 303 10*3/uL (ref 150–400)
RBC: 4.05 MIL/uL (ref 3.87–5.11)
RDW: 13.7 % (ref 11.5–15.5)
WBC: 14.7 10*3/uL — ABNORMAL HIGH (ref 4.0–10.5)
nRBC: 0 % (ref 0.0–0.2)

## 2021-01-05 LAB — MAGNESIUM: Magnesium: 1.8 mg/dL (ref 1.7–2.4)

## 2021-01-05 LAB — BASIC METABOLIC PANEL
Anion gap: 13 (ref 5–15)
BUN: 14 mg/dL (ref 6–20)
CO2: 26 mmol/L (ref 22–32)
Calcium: 8.6 mg/dL — ABNORMAL LOW (ref 8.9–10.3)
Chloride: 95 mmol/L — ABNORMAL LOW (ref 98–111)
Creatinine, Ser: 0.72 mg/dL (ref 0.44–1.00)
GFR, Estimated: >60 mL/min (ref >60)
Glucose, Bld: 159 mg/dL — ABNORMAL HIGH (ref 70–99)
Potassium: 3.8 mmol/L (ref 3.5–5.1)
Sodium: 134 mmol/L — ABNORMAL LOW (ref 135–145)

## 2021-01-05 LAB — TROPONIN I (HIGH SENSITIVITY)
Troponin I (High Sensitivity): 7 ng/L (ref <18)
Troponin I (High Sensitivity): 12 ng/L (ref <18)

## 2021-01-05 LAB — BRAIN NATRIURETIC PEPTIDE: B Natriuretic Peptide: 7.5 pg/mL (ref 0.0–100.0)

## 2021-01-05 MED ORDER — CARVEDILOL 3.125 MG PO TABS
6.2500 mg | ORAL_TABLET | Freq: Once | ORAL | Status: AC
  Administered 2021-01-05: 6.25 mg via ORAL
  Filled 2021-01-05: qty 2

## 2021-01-05 MED ORDER — MAGNESIUM SULFATE IN D5W 1-5 GM/100ML-% IV SOLN
1.0000 g | Freq: Once | INTRAVENOUS | Status: AC
  Administered 2021-01-05: 1 g via INTRAVENOUS
  Filled 2021-01-05: qty 100

## 2021-01-05 MED ORDER — OXYCODONE-ACETAMINOPHEN 5-325 MG PO TABS
1.0000 | ORAL_TABLET | Freq: Once | ORAL | Status: AC
Start: 2021-01-05 — End: 2021-01-05
  Administered 2021-01-05: 1 via ORAL
  Filled 2021-01-05: qty 1

## 2021-01-05 NOTE — ED Notes (Signed)
Called ptar for patient no eta given

## 2021-01-05 NOTE — ED Notes (Signed)
Patient transported to X-ray 

## 2021-01-05 NOTE — ED Triage Notes (Signed)
Pt brought in via [facility] with c/c of SVT. Per EMS pt was in SVT with rate of 177, ems gave 6 adenosine. Pt was complaining of chest pain and feeling like heart was racing. Chronic 4L [place]. Pt took two nitro prior to EMS. Pt believes she took too much lasix this morning. She took 4mg  instead of her normal 2mg .   324 ASA, 177 --> 108HR, 120/60, 97% 4L

## 2021-01-05 NOTE — ED Provider Notes (Signed)
[facility] EMERGENCY DEPARTMENT Provider Note   CSN: [id number] Arrival date & time: 01/05/21  1607     History Chief Complaint  Patient presents with   Tachycardia    Pt brought in via [facility] with c/c of SVT. Per EMS pt was in SVT with rate of 177, ems gave 6 adenosine. Pt was complaining of chest pain and feeling like heart was racing. Chronic 4L [place]. Pt took two nitro prior to EMS. Pt believes she took too much lasix this morning. She took 70m instead of her normal 274m   324 ASA, 177 --> 108HR, 120/60, 97% 4L     ShJacia Owens a [age].o. female.  Presented to ER with concern for palpitations.  Patient states that earlier this morning she felt like her heart was racing and had associated chest discomfort.  States that after receiving the medicine from EMS her palpitations and heart racing sensation went away but she still has ongoing chest discomfort.  Central, mild to moderate.  Also is having left knee pain.  States left knee pain is chronic, normally takes oxycodone, is supposed to follow-up with her orthopedist this morning but likely will miss appointment.  No trauma or injuries recently.  No difficulty in breathing.  Chronically on 4 L nasal cannula.  Patient has history of morbid obesity, diastolic heart failure, COPD, diabetes, DVT, hypertension  HPI     Past Medical History:  Diagnosis Date   Anxiety    Arthritis    Asthma    Chronic diastolic CHF (congestive heart failure) (HCC)    COPD (chronic obstructive pulmonary disease) (HCC)    Uses Oxygen at night   Depression    Diabetes mellitus without complication (HCC)    Dyspnea    occ   Family history of thyroid cancer    GERD (gastroesophageal reflux disease)    Gout    Headache    migraines   History of DVT of lower extremity    History of nuclear stress test    Myoview 2/17:  Low risk stress nuclear study with a small, moderate intensity, partially reversible inferior lateral defect  consistent with small prior infarct and minimal peri-infarct ischemia; EF 68 with normal wall motion   Hypertension    Pneumonia    Thyroid cancer (HCSharon6/16/2018    Patient Active Problem List   Diagnosis Date Noted   Respiratory failure with hypercapnia (HCAtoka09/15/2022   Anemia 12/23/2020   Acute on chronic respiratory failure with hypoxia and hypercapnia (HCValencia01/12/2020   Acute on chronic respiratory failure with hypoxia (HCCountry Walk01/12/2020   Hemoptysis 0137/01/6268 Acute metabolic encephalopathy 0148/54/6270 Acute encephalopathy 04/18/2020   Chronic diastolic CHF (congestive heart failure) (HCJaconita01/12/2020   Sepsis (HCDubuque01/12/2020   Morbid obesity with BMI of 50.0-59.9, adult (HCSalem   Fall at home, initial encounter 06/23/2018   HNP (herniated nucleus pulposus), thoracic 06/23/2018   CHF (congestive heart failure) (HCWann03/01/2019   Hypertensive disorder 08/28/2017   Obesity 08/28/2017   COPD exacerbation (HCFayetteville04/27/2019   Acute and chronic respiratory failure with hypercapnia (HCPonce   Pulmonary HTN (HCReno   Genetic testing 02/27/2017   Family history of thyroid cancer    Hypomagnesemia 10/03/2016   Atypical chest pain    HLD (hyperlipidemia) 09/23/2016   Gout 09/23/2016   DVT (deep vein thrombosis) in pregnancy 09/23/2016   Thyroid cancer (HCAmerican Canyon06/16/2018   Hypocalcemia 09/23/2016   AKI (acute kidney  injury) ([place]) 09/23/2016   Acute pulmonary edema ([place])    Acute hypoxemic respiratory failure (HCC)    Ventilator dependent ([place])    Chronic respiratory failure with hypoxia and hypercapnia ([place]) 07/16/2016   CAP (community acquired pneumonia) 01/23/2016   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 01/23/2016   Pulmonary hypertension ([place]) 01/23/2016   Obesity hypoventilation syndrome ([place]) 08/26/2015   Dyslipidemia associated with type 2 diabetes mellitus ([place]) 04/24/2015   Leukocytosis 04/24/2015   Controlled type 2 diabetes mellitus without complication,  without long-term current use of insulin ([place]) 04/24/2015   Anxiety 04/24/2015   Benign essential HTN 04/24/2015   Normocytic anemia 04/24/2015   OSA (obstructive sleep apnea) 05/10/2012   Tobacco abuse 07/04/2011    Past Surgical History:  Procedure Laterality Date   BUNIONECTOMY Bilateral    CARDIAC CATHETERIZATION N/A 06/23/2015   Procedure: Right/Left Heart Cath and Coronary Angiography;  Surgeon: [name], MD;  Location: [facility];  Service: Cardiovascular;  Laterality: N/A;   COLONOSCOPY WITH PROPOFOL N/A 12/15/2015   Procedure: COLONOSCOPY WITH PROPOFOL;  Surgeon: [name], MD;  Location: [facility];  Service: Endoscopy;  Laterality: N/A;   RIGHT HEART CATH N/A 10/01/2019   Procedure: RIGHT HEART CATH;  Surgeon: [name], MD;  Location: [facility];  Service: Cardiovascular;  Laterality: N/A;   THYROIDECTOMY  09/19/2016   THYROIDECTOMY N/A 09/19/2016   Procedure: TOTAL THYROIDECTOMY;  Surgeon: [name], MD;  Location: [facility];  Service: General;  Laterality: N/A;   TONSILLECTOMY     TOTAL ABDOMINAL HYSTERECTOMY  07/14/10     OB History   No obstetric history on file.     Family History  Problem Relation Age of Onset   Cancer Father        thought to be due to exposure to concrete   Diabetes Mother    Thyroid cancer Mother        dx in her 45s-60s   Cancer Maternal Uncle        2 uncles with cancer NOS   Brain cancer Paternal Aunt    Cancer Cousin        maternal first cousin - NOS   Cancer Cousin        maternal first cousin - NOS    Social History   Tobacco Use   Smoking status: Former    Packs/day: 0.50    Years: 41.00    Pack years: 20.50    Types: Cigarettes    Quit date: 07/2016    Years since quitting: 4.4   Smokeless tobacco: Never   Tobacco comments:    Starting to [employer] -- using Nicotine Patch  Vaping Use   Vaping Use: Never used  Substance Use Topics   Alcohol use: Not Currently    Alcohol/week: 0.0  standard drinks    Comment: ? none now   Drug use: Never    Comment: none for long time    Home Medications Prior to Admission medications   Medication Sig Start Date End Date Taking? Authorizing Provider  albuterol (PROVENTIL HFA;VENTOLIN HFA) 108 (90 Base) MCG/ACT inhaler Inhale 1-2 puffs into the lungs every 6 (six) hours as needed for wheezing or shortness of breath. 04/23/18   Long, [name], MD  allopurinol (ZYLOPRIM) 100 MG tablet Take 100 mg by mouth daily.    [provider]  benzonatate (TESSALON) 200 MG capsule Take 1 capsule (200 mg total) by mouth in the morning,  at noon, and at bedtime. 7 DS 04/13/20   Regalado, Jerald Kief A, MD  blood glucose meter kit and supplies KIT Dispense based on patient and insurance preference. Use up to four times daily as directed. 12/25/20   Little Ishikawa, MD  calcitRIOL (ROCALTROL) 0.5 MCG capsule Take 1 capsule (0.5 mcg total) by mouth daily. 09/26/16   Hongalgi, [name], MD  carvedilol (COREG) 6.25 MG tablet Take 1 tablet (6.25 mg total) by mouth 2 (two) times daily with a meal. 01/23/18   Bensimhon, [name], MD  clonazePAM (KLONOPIN) 2 MG tablet Take 1 tablet (2 mg total) by mouth 3 (three) times daily. for anxiety Patient taking differently: Take 2 mg by mouth 3 (three) times daily. 06/23/19   [name], MD  cyclobenzaprine (FLEXERIL) 10 MG tablet Take 10 mg by mouth 3 (three) times daily. 11/19/19   [provider]  diclofenac sodium (VOLTAREN) 1 % GEL Apply 2 g topically 4 (four) times daily as needed (for pain). Patient not taking: No sig reported 06/19/18   [name], MD  EPINEPHrine 0.3 mg/0.3 mL IJ SOAJ injection Inject 0.3 mg into the muscle once as needed for anaphylaxis.    [provider]  FEROSUL 325 (65 Fe) MG tablet Take 325 mg by mouth daily. 03/22/17   [provider]  glucose blood test strip Use as instructed to check blood sugar up to 4 times daily Patient taking differently: 1 each  by Other route See admin instructions. Use as instructed to check blood sugar up to 4 times daily 08/06/17   [name], DO  guaiFENesin (MUCINEX) 600 MG 12 hr tablet Take 2 tablets (1,200 mg total) by mouth 2 (two) times daily. 04/13/20   Regalado, Belkys A, MD  ipratropium-albuterol (DUONEB) 0.5-2.5 (3) MG/3ML SOLN Take 3 mLs by nebulization 4 (four) times daily.  04/05/17   [provider]  levothyroxine (SYNTHROID, LEVOTHROID) 112 MCG tablet Take 224 mcg by mouth daily before breakfast. 07/11/17   [provider]  lisinopril (PRINIVIL,ZESTRIL) 5 MG tablet Take 1 tablet (5 mg total) by mouth daily. Please call for office visit [phone] Patient taking differently: Take 5 mg by mouth daily. 08/15/17   [name], MD  loperamide (IMODIUM) 2 MG capsule Take 1 capsule (2 mg total) by mouth 4 (four) times daily as needed for diarrhea or loose stools. 1/66/06   [name], MD  metFORMIN (GLUCOPHAGE) 500 MG tablet Take 1 tablet (500 mg total) by mouth 2 (two) times daily with a meal. 12/11/14   [name], MD  nitroGLYCERIN (NITROSTAT) 0.4 MG SL tablet Place 1 tablet (0.4 mg total) under the tongue every 5 (five) minutes as needed for chest pain. 06/19/18   [name], MD  nystatin cream (MYCOSTATIN) Apply 1 application topically 2 (two) times daily as needed for dry skin. 04/13/20   Regalado, Belkys A, MD  nystatin ointment (MYCOSTATIN) Apply 1 application topically 2 (two) times daily. 09/10/20   [provider]  omeprazole (PRILOSEC) 20 MG capsule Take 20 mg by mouth 2 (two) times daily before a meal. 11/19/19   [provider]  oxyCODONE-acetaminophen (PERCOCET) 10-325 MG tablet Take 1 tablet by mouth every 6 (six) hours as needed for pain. 12/03/19   [provider]  OXYGEN Inhale 2-3 L into the lungs continuous.    [provider]  oxymetazoline (AFRIN NASAL SPRAY) 0.05 % nasal spray Place 1 spray into both nostrils 2 (two) times  daily. 10/04/20   Pearson Grippe, DO  polyethylene glycol (MIRALAX / GLYCOLAX) 17 g packet Take 17 g by mouth daily as needed for mild constipation.    [provider]  potassium chloride SA (K-DUR,KLOR-CON) 20 MEQ tablet Take 1 tablet (20 mEq total) by mouth 2 (two) times daily. 07/24/18   [name], MD  predniSONE (DELTASONE) 20 MG tablet Take 2 tablets (40 mg total) by mouth daily. 11/11/20   Horton, Barbette Hair, MD  rosuvastatin (CRESTOR) 10 MG tablet Take 10 mg by mouth every evening. 11/19/19   [provider]  senna-docusate (SENNA-PLUS) 8.6-50 MG tablet Take 1 tablet by mouth daily.    [provider]  torsemide (DEMADEX) 20 MG tablet Take 2 tablets (40 mg total) by mouth 2 (two) times daily. 12/17/20   [name], MD  traZODone (DESYREL) 50 MG tablet Take 100 mg by mouth at bedtime. 11/19/19   [provider]  triamcinolone cream (KENALOG) 0.1 % Apply 1 application topically 2 (two) times daily. 09/10/20   [provider]  umeclidinium-vilanterol (ANORO ELLIPTA) 62.5-25 MCG/INH AEPB Inhale 1 puff into the lungs daily.    [provider]    Allergies    Bee venom, Fioricet [butalbital-apap-caffeine], Ibuprofen, Lamisil [terbinafine], and Nsaids  Review of Systems   Review of Systems  Constitutional:  Negative for chills and fever.  HENT:  Negative for ear pain and sore throat.   Eyes:  Negative for pain and visual disturbance.  Respiratory:  Positive for chest tightness. Negative for cough and shortness of breath.   Cardiovascular:  Positive for chest pain. Negative for palpitations.  Gastrointestinal:  Negative for abdominal pain and vomiting.  Genitourinary:  Negative for dysuria and hematuria.  Musculoskeletal:  Positive for arthralgias. Negative for back pain.  Skin:  Negative for color change and rash.  Neurological:  Negative for seizures and syncope.  All other systems reviewed and are negative.  Physical  Exam Updated Vital Signs BP 107/87   Pulse 92   Temp 98.8 F (37.1 C) (Oral)   Resp (!) 21   Ht _0  (1.651 m)   Wt (!) 139.1 kg   SpO2 95%   BMI 51.03 kg/m   Physical Exam Vitals and nursing note reviewed.  Constitutional:      General: She is not in acute distress.    Appearance: She is well-developed.  HENT:     Head: Normocephalic and atraumatic.  Eyes:     Conjunctiva/sclera: Conjunctivae normal.  Cardiovascular:     Rate and Rhythm: Regular rhythm. Tachycardia present.     Heart sounds: No murmur heard. Pulmonary:     Effort: Pulmonary effort is normal. No respiratory distress.     Breath sounds: Normal breath sounds.  Abdominal:     Palpations: Abdomen is soft.     Tenderness: There is no abdominal tenderness.  Musculoskeletal:        General: No deformity or signs of injury.     Cervical back: Neck supple.  Skin:    General: Skin is warm and dry.  Neurological:     General: No focal deficit present.     Mental Status: She is alert.  Psychiatric:        Mood and Affect: Mood normal.    ED Results / Procedures / Treatments   Labs (all labs ordered are listed, but only abnormal results are displayed) Labs Reviewed  CBC WITH DIFFERENTIAL/PLATELET - Abnormal; Notable for the following components:  Result Value   WBC 14.7 (*)    Neutro Abs 10.4 (*)    Abs Immature Granulocytes 0.12 (*)    All other components within normal limits  BASIC METABOLIC PANEL - Abnormal; Notable for the following components:   Sodium 134 (*)    Chloride 95 (*)    Glucose, Bld 159 (*)    Calcium 8.6 (*)    All other components within normal limits  MAGNESIUM  BRAIN NATRIURETIC PEPTIDE  TROPONIN I (HIGH SENSITIVITY)  TROPONIN I (HIGH SENSITIVITY)    EKG EKG Interpretation  Date/Time:  Wednesday January 05 2021 07:41:24 EDT Ventricular Rate:  108 PR Interval:  154 QRS Duration: 79 QT Interval:  337 QTC Calculation: 452 R Axis:   43 Text Interpretation: Sinus  tachycardia Low voltage, precordial leads Baseline wander in lead(s) V1 Confirmed by Madalyn Rob [phone]) on 01/05/2021 8:17:38 AM  Radiology DG Chest 2 View  Result Date: 01/05/2021 CLINICAL DATA:  Left side chest pain EXAM: CHEST - 2 VIEW COMPARISON:  12/23/2020 FINDINGS: Heart is borderline in size. Mild vascular congestion. Bibasilar opacities are similar prior study. No effusions or acute bony abnormality. IMPRESSION: Mild vascular congestion. Bibasilar opacities could reflect edema or infiltrates. Electronically Signed   By: Rolm Baptise M.D.   On: 01/05/2021 09:54    Procedures Procedures   Medications Ordered in ED Medications  carvedilol (COREG) tablet 6.25 mg (6.25 mg Oral Given 01/05/21 0828)  oxyCODONE-acetaminophen (PERCOCET/ROXICET) 5-325 MG per tablet 1 tablet (1 tablet Oral Given 01/05/21 1102)  magnesium sulfate IVPB 1 g 100 mL (0 g Intravenous Stopped 01/05/21 1254)    ED Course  I have reviewed the triage vital signs and the nursing notes.  Pertinent labs & imaging results that were available during my care of the patient were reviewed by me and considered in my medical decision making (see chart for details).    MDM Rules/Calculators/A&P                         60 year old lady presents to ER with concern for SVT.  EMS gave 6 mg of adenosine and SVT resolved.  When patient arrived here she initially complained of some ongoing chest discomfort.  Check basic blood work, magnesium mildly low but otherwise grossly stable.  Serial troponins were within normal limits and no obvious ischemic change on EKG, low suspicion for ACS at present.  Suspect her symptoms were related to SVT based on the initial report.  Had an episode a few weeks ago of SVT. On beta blocker. Observed in ER. No further episodes while in ER. On reassessment pt currently asymptomatic.  Believe she is appropriate for discharge and outpatient management.  She has appointment with her PCP tomorrow she reports.   Recommend she keep this appointment, discussed this episode with him and furthermore follow-up with her cardiologist to discuss these episodes.    After the discussed management above, the patient was determined to be safe for discharge.  The patient was in agreement with this plan and all questions regarding their care were answered.  ED return precautions were discussed and the patient will return to the ED with any significant worsening of condition.   Final Clinical Impression(s) / ED Diagnoses Final diagnoses:  SVT (supraventricular tachycardia) ([place])    Rx / DC Orders ED Discharge Orders     None        Lucrezia Starch, MD 01/05/21 [phone]

## 2021-01-05 NOTE — Discharge Instructions (Addendum)
Recommend following up with cardiology and primary care doctor.  Continue your current regimen as previously prescribed.  Come back to ER if you develop chest pain, palpitations or other new concerning symptom

## 2021-01-06 DIAGNOSIS — I110 Hypertensive heart disease with heart failure: Secondary | ICD-10-CM | POA: Diagnosis not present

## 2021-01-07 DIAGNOSIS — I110 Hypertensive heart disease with heart failure: Secondary | ICD-10-CM | POA: Diagnosis not present

## 2021-01-08 ENCOUNTER — Other Ambulatory Visit: Payer: Self-pay

## 2021-01-08 ENCOUNTER — Encounter (HOSPITAL_COMMUNITY): Payer: Self-pay

## 2021-01-08 ENCOUNTER — Emergency Department (HOSPITAL_COMMUNITY)
Admission: EM | Admit: 2021-01-08 | Discharge: 2021-01-08 | Disposition: A | Discharge status: Home / Self Care | Payer: Medicaid Other | Attending: Emergency Medicine | Admitting: Emergency Medicine

## 2021-01-08 DIAGNOSIS — R457 State of emotional shock and stress, unspecified: Secondary | ICD-10-CM | POA: Diagnosis not present

## 2021-01-08 DIAGNOSIS — I110 Hypertensive heart disease with heart failure: Secondary | ICD-10-CM | POA: Insufficient documentation

## 2021-01-08 DIAGNOSIS — Z7689 Persons encountering health services in other specified circumstances: Secondary | ICD-10-CM | POA: Diagnosis not present

## 2021-01-08 DIAGNOSIS — Z743 Need for continuous supervision: Secondary | ICD-10-CM | POA: Diagnosis not present

## 2021-01-08 DIAGNOSIS — Z9981 Dependence on supplemental oxygen: Secondary | ICD-10-CM | POA: Insufficient documentation

## 2021-01-08 DIAGNOSIS — Z955 Presence of coronary angioplasty implant and graft: Secondary | ICD-10-CM | POA: Diagnosis not present

## 2021-01-08 DIAGNOSIS — Z7984 Long term (current) use of oral hypoglycemic drugs: Secondary | ICD-10-CM | POA: Diagnosis not present

## 2021-01-08 DIAGNOSIS — J441 Chronic obstructive pulmonary disease with (acute) exacerbation: Secondary | ICD-10-CM | POA: Insufficient documentation

## 2021-01-08 DIAGNOSIS — Z85850 Personal history of malignant neoplasm of thyroid: Secondary | ICD-10-CM | POA: Diagnosis not present

## 2021-01-08 DIAGNOSIS — E119 Type 2 diabetes mellitus without complications: Secondary | ICD-10-CM | POA: Insufficient documentation

## 2021-01-08 DIAGNOSIS — Z87891 Personal history of nicotine dependence: Secondary | ICD-10-CM | POA: Insufficient documentation

## 2021-01-08 DIAGNOSIS — Z7951 Long term (current) use of inhaled steroids: Secondary | ICD-10-CM | POA: Insufficient documentation

## 2021-01-08 DIAGNOSIS — J45909 Unspecified asthma, uncomplicated: Secondary | ICD-10-CM | POA: Diagnosis not present

## 2021-01-08 DIAGNOSIS — I5032 Chronic diastolic (congestive) heart failure: Secondary | ICD-10-CM | POA: Diagnosis not present

## 2021-01-08 DIAGNOSIS — R350 Frequency of micturition: Secondary | ICD-10-CM | POA: Diagnosis not present

## 2021-01-08 DIAGNOSIS — Z79899 Other long term (current) drug therapy: Secondary | ICD-10-CM | POA: Diagnosis not present

## 2021-01-08 LAB — CBG MONITORING, ED: Glucose-Capillary: 139 mg/dL — ABNORMAL HIGH (ref 70–99)

## 2021-01-08 NOTE — ED Notes (Addendum)
Pt continues to call out repeatedly asking for different things, pt is upset because she feels like she is being mistreated while in our care. Pt has been given everything she has asked for on my shift so far.

## 2021-01-08 NOTE — ED Triage Notes (Signed)
Said that she feels better being up here being monitored.

## 2021-01-08 NOTE — ED Provider Notes (Signed)
Signout from Dr. Vallery Ridge.  60 year old female here after losing power at home and is dependent on her oxygen generator for her respiratory disease.  Awaiting power to return so she can be discharged home. Physical Exam  BP (!) 159/89   Pulse 84   Temp 98.8 F (37.1 C) (Oral)   Resp 17   Ht 5\' 5"  (1.651 m)   Wt 136.1 kg   SpO2 98%   BMI 49.92 kg/m   Physical Exam  ED Course/Procedures     Procedures  MDM  Apparently patient's power is on now so they are waiting for [facility] to take her home.       Hayden Rasmussen, MD 01/09/21 [phone]

## 2021-01-08 NOTE — ED Provider Notes (Signed)
[facility] DEPT Provider Note   CSN: [id number] Arrival date & time: 01/08/21  2130     History Chief Complaint  Patient presents with   Anxiety    PT COMPLAINING OF HAVING A PANIC ATTACK AFTER HER POWER WENT OUT AT HOME, AND HER CONCENTRATOR WENT OFF. IS ON 4 LTRS ALL THE TIME.     Michele Owens is a 60 y.o. female.  HPI     This is a 60 year old female with a history of CHF, COPD, diabetes, hypertension, chronic hypoxia on 4 L of oxygen who presents because her power went out and her portable oxygen concentrator only last for 1 hour.  Patient states that she was afraid that she would run out of oxygen.  She called EMS.  At time of EMS arrival, power was still out.  Currently she has no new physical complaints.  Denies any new shortness of breath, chest pain, abdominal pain.  Does report frequent urination which she attributes to Lasix.  Past Medical History:  Diagnosis Date   Anxiety    Arthritis    Asthma    Chronic diastolic CHF (congestive heart failure) (HCC)    COPD (chronic obstructive pulmonary disease) (HCC)    Uses Oxygen at night   Depression    Diabetes mellitus without complication (HCC)    Dyspnea    occ   Family history of thyroid cancer    GERD (gastroesophageal reflux disease)    Gout    Headache    migraines   History of DVT of lower extremity    History of nuclear stress test    Myoview 2/17:  Low risk stress nuclear study with a small, moderate intensity, partially reversible inferior lateral defect consistent with small prior infarct and minimal peri-infarct ischemia; EF 68 with normal wall motion   Hypertension    Pneumonia    Thyroid cancer ([place]) 09/23/2016    Patient Active Problem List   Diagnosis Date Noted   Respiratory failure with hypercapnia ([place]) 12/23/2020   Anemia 12/23/2020   Acute on chronic respiratory failure with hypoxia and hypercapnia ([place]) 04/18/2020   Acute on chronic respiratory failure with  hypoxia ([place]) 04/18/2020   Hemoptysis 86/57/8469   Acute metabolic encephalopathy 62/95/2841   Acute encephalopathy 04/18/2020   Chronic diastolic CHF (congestive heart failure) ([place]) 04/18/2020   Sepsis ([place]) 04/18/2020   Morbid obesity with BMI of 50.0-59.9, adult ([place])    Fall at home, initial encounter 06/23/2018   HNP (herniated nucleus pulposus), thoracic 06/23/2018   CHF (congestive heart failure) ([place]) 06/18/2018   Hypertensive disorder 08/28/2017   Obesity 08/28/2017   COPD exacerbation ([place]) 08/04/2017   Acute and chronic respiratory failure with hypercapnia ([place])    Pulmonary HTN ([place])    Genetic testing 02/27/2017   Family history of thyroid cancer    Hypomagnesemia 10/03/2016   Atypical chest pain    HLD (hyperlipidemia) 09/23/2016   Gout 09/23/2016   DVT (deep vein thrombosis) in pregnancy 09/23/2016   Thyroid cancer ([place]) 09/23/2016   Hypocalcemia 09/23/2016   AKI (acute kidney injury) ([place]) 09/23/2016   Acute pulmonary edema (HCC)    Acute hypoxemic respiratory failure ([place])    Ventilator dependent ([place])    Chronic respiratory failure with hypoxia and hypercapnia ([place]) 07/16/2016   CAP (community acquired pneumonia) 01/23/2016   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 01/23/2016   Pulmonary hypertension ([place]) 01/23/2016   Obesity hypoventilation syndrome ([place]) 08/26/2015   Dyslipidemia associated with type  2 diabetes mellitus ([place]) 04/24/2015   Leukocytosis 04/24/2015   Controlled type 2 diabetes mellitus without complication, without long-term current use of insulin ([place]) 04/24/2015   Anxiety 04/24/2015   Benign essential HTN 04/24/2015   Normocytic anemia 04/24/2015   OSA (obstructive sleep apnea) 05/10/2012   Tobacco abuse 07/04/2011    Past Surgical History:  Procedure Laterality Date   BUNIONECTOMY Bilateral    CARDIAC CATHETERIZATION N/A 06/23/2015   Procedure: Right/Left Heart Cath and Coronary Angiography;  Surgeon: [name],  MD;  Location: [facility];  Service: Cardiovascular;  Laterality: N/A;   COLONOSCOPY WITH PROPOFOL N/A 12/15/2015   Procedure: COLONOSCOPY WITH PROPOFOL;  Surgeon: [name], MD;  Location: [facility];  Service: Endoscopy;  Laterality: N/A;   RIGHT HEART CATH N/A 10/01/2019   Procedure: RIGHT HEART CATH;  Surgeon: [name], MD;  Location: [facility];  Service: Cardiovascular;  Laterality: N/A;   THYROIDECTOMY  09/19/2016   THYROIDECTOMY N/A 09/19/2016   Procedure: TOTAL THYROIDECTOMY;  Surgeon: [name], MD;  Location: [facility];  Service: General;  Laterality: N/A;   TONSILLECTOMY     TOTAL ABDOMINAL HYSTERECTOMY  07/14/10     OB History   No obstetric history on file.     Family History  Problem Relation Age of Onset   Cancer Father        thought to be due to exposure to concrete   Diabetes Mother    Thyroid cancer Mother        dx in her 59s-60s   Cancer Maternal Uncle        2 uncles with cancer NOS   Brain cancer Paternal Aunt    Cancer Cousin        maternal first cousin - NOS   Cancer Cousin        maternal first cousin - NOS    Social History   Tobacco Use   Smoking status: Former    Packs/day: 0.50    Years: 41.00    Pack years: 20.50    Types: Cigarettes    Quit date: 07/2016    Years since quitting: 4.5   Smokeless tobacco: Never   Tobacco comments:    Starting to [employer] -- using Nicotine Patch  Vaping Use   Vaping Use: Never used  Substance Use Topics   Alcohol use: Not Currently    Alcohol/week: 0.0 standard drinks    Comment: ? none now   Drug use: Never    Comment: none for long time    Home Medications Prior to Admission medications   Medication Sig Start Date End Date Taking? Authorizing Provider  albuterol (PROVENTIL HFA;VENTOLIN HFA) 108 (90 Base) MCG/ACT inhaler Inhale 1-2 puffs into the lungs every 6 (six) hours as needed for wheezing or shortness of breath. 04/23/18   Long, [name], MD  allopurinol (ZYLOPRIM)  100 MG tablet Take 100 mg by mouth daily.    [provider]  benzonatate (TESSALON) 200 MG capsule Take 1 capsule (200 mg total) by mouth in the morning, at noon, and at bedtime. 7 DS 04/13/20   Regalado, Jerald Kief A, MD  blood glucose meter kit and supplies KIT Dispense based on patient and insurance preference. Use up to four times daily as directed. 12/25/20   Little Ishikawa, MD  calcitRIOL (ROCALTROL) 0.5 MCG capsule Take 1 capsule (0.5 mcg total) by mouth daily. 09/26/16   Hongalgi, [name], MD  carvedilol (COREG) 6.25 MG  tablet Take 1 tablet (6.25 mg total) by mouth 2 (two) times daily with a meal. 01/23/18   Bensimhon, [name], MD  clonazePAM (KLONOPIN) 2 MG tablet Take 1 tablet (2 mg total) by mouth 3 (three) times daily. for anxiety Patient taking differently: Take 2 mg by mouth 3 (three) times daily. 06/23/19   [name], MD  cyclobenzaprine (FLEXERIL) 10 MG tablet Take 10 mg by mouth 3 (three) times daily. 11/19/19   [provider]  diclofenac sodium (VOLTAREN) 1 % GEL Apply 2 g topically 4 (four) times daily as needed (for pain). Patient not taking: No sig reported 06/19/18   [name], MD  EPINEPHrine 0.3 mg/0.3 mL IJ SOAJ injection Inject 0.3 mg into the muscle once as needed for anaphylaxis.    [provider]  FEROSUL 325 (65 Fe) MG tablet Take 325 mg by mouth daily. 03/22/17   [provider]  glucose blood test strip Use as instructed to check blood sugar up to 4 times daily Patient taking differently: 1 each by Other route See admin instructions. Use as instructed to check blood sugar up to 4 times daily 08/06/17   [name], DO  guaiFENesin (MUCINEX) 600 MG 12 hr tablet Take 2 tablets (1,200 mg total) by mouth 2 (two) times daily. 04/13/20   Regalado, Belkys A, MD  ipratropium-albuterol (DUONEB) 0.5-2.5 (3) MG/3ML SOLN Take 3 mLs by nebulization 4 (four) times daily.  04/05/17   [provider]  levothyroxine (SYNTHROID,  LEVOTHROID) 112 MCG tablet Take 224 mcg by mouth daily before breakfast. 07/11/17   [provider]  lisinopril (PRINIVIL,ZESTRIL) 5 MG tablet Take 1 tablet (5 mg total) by mouth daily. Please call for office visit [phone] Patient taking differently: Take 5 mg by mouth daily. 08/15/17   [name], MD  loperamide (IMODIUM) 2 MG capsule Take 1 capsule (2 mg total) by mouth 4 (four) times daily as needed for diarrhea or loose stools. 10/06/34   [name], MD  metFORMIN (GLUCOPHAGE) 500 MG tablet Take 1 tablet (500 mg total) by mouth 2 (two) times daily with a meal. 12/11/14   [name], MD  nitroGLYCERIN (NITROSTAT) 0.4 MG SL tablet Place 1 tablet (0.4 mg total) under the tongue every 5 (five) minutes as needed for chest pain. 06/19/18   [name], MD  nystatin cream (MYCOSTATIN) Apply 1 application topically 2 (two) times daily as needed for dry skin. 04/13/20   Regalado, Belkys A, MD  nystatin ointment (MYCOSTATIN) Apply 1 application topically 2 (two) times daily. 09/10/20   [provider]  omeprazole (PRILOSEC) 20 MG capsule Take 20 mg by mouth 2 (two) times daily before a meal. 11/19/19   [provider]  oxyCODONE-acetaminophen (PERCOCET) 10-325 MG tablet Take 1 tablet by mouth every 6 (six) hours as needed for pain. 12/03/19   [provider]  OXYGEN Inhale 2-3 L into the lungs continuous.    [provider]  oxymetazoline (AFRIN NASAL SPRAY) 0.05 % nasal spray Place 1 spray into both nostrils 2 (two) times daily. 10/04/20   Pearson Grippe, DO  polyethylene glycol (MIRALAX / GLYCOLAX) 17 g packet Take 17 g by mouth daily as needed for mild constipation.    [provider]  potassium chloride SA (K-DUR,KLOR-CON) 20 MEQ tablet Take 1 tablet (20 mEq total) by mouth 2 (two) times daily. 07/24/18   [name], MD  predniSONE (DELTASONE) 20 MG tablet Take 2 tablets (40 mg total)  by mouth daily. 11/11/20   Priscila Bean, Barbette Hair, MD   rosuvastatin (CRESTOR) 10 MG tablet Take 10 mg by mouth every evening. 11/19/19   [provider]  senna-docusate (SENNA-PLUS) 8.6-50 MG tablet Take 1 tablet by mouth daily.    [provider]  torsemide (DEMADEX) 20 MG tablet Take 2 tablets (40 mg total) by mouth 2 (two) times daily. 12/17/20   [name], MD  traZODone (DESYREL) 50 MG tablet Take 100 mg by mouth at bedtime. 11/19/19   [provider]  triamcinolone cream (KENALOG) 0.1 % Apply 1 application topically 2 (two) times daily. 09/10/20   [provider]  umeclidinium-vilanterol (ANORO ELLIPTA) 62.5-25 MCG/INH AEPB Inhale 1 puff into the lungs daily.    [provider]    Allergies    Bee venom, Fioricet [butalbital-apap-caffeine], Ibuprofen, Lamisil [terbinafine], and Nsaids  Review of Systems   Review of Systems  Constitutional:  Negative for fever.  Respiratory:  Negative for shortness of breath.   Cardiovascular:  Negative for chest pain.  Gastrointestinal:  Negative for abdominal pain.  Genitourinary:  Positive for frequency.  All other systems reviewed and are negative.  Physical Exam Updated Vital Signs BP (!) 147/104 (BP Location: Right Arm)   Pulse 99   Temp 98.8 F (37.1 C) (Oral)   Resp 20   Ht 1.651 m ('5\' 5"' )   Wt 136.1 kg   SpO2 98%   BMI 49.92 kg/m   Physical Exam Vitals and nursing note reviewed.  Constitutional:      Appearance: She is well-developed.     Comments: Morbidly obese, chronically ill-appearing, nontoxic  HENT:     Head: Normocephalic and atraumatic.     Nose: Nose normal.     Mouth/Throat:     Mouth: Mucous membranes are moist.  Cardiovascular:     Rate and Rhythm: Normal rate and regular rhythm.  Pulmonary:     Effort: Pulmonary effort is normal. No respiratory distress.     Comments: Nasal cannula in place, no respiratory distress Abdominal:     Palpations: Abdomen is soft.  Musculoskeletal:     Cervical back: Neck supple.      Comments: Difficult to assess lower extremity swelling secondary to body habitus  Skin:    General: Skin is warm and dry.  Neurological:     Mental Status: She is alert and oriented to person, place, and time.  Psychiatric:        Mood and Affect: Mood normal.    ED Results / Procedures / Treatments   Labs (all labs ordered are listed, but only abnormal results are displayed) Labs Reviewed - No data to display  EKG None  Radiology No results found.  Procedures Procedures   Medications Ordered in ED Medications - No data to display  ED Course  I have reviewed the triage vital signs and the nursing notes.  Pertinent labs & imaging results that were available during my care of the patient were reviewed by me and considered in my medical decision making (see chart for details).    MDM Rules/Calculators/A&P                           Patient presents because her power went out and she is concerned that she would not have an oxygen concentrator.  She has no new symptoms today.  She is chronically ill-appearing but nontoxic.  Vital signs notable for blood pressure of 147/104.  She is on her baseline 4 L of oxygen.  Given that she is without acute complaint, do not feel she needs or warrants work-up.  She was seen and evaluated 2 days ago for palpitations and chest pain in setting of SVT.  She had work-up at that time.  We will hold and call in a few hours to see if her power has returned.  Final Clinical Impression(s) / ED Diagnoses Final diagnoses:  Oxygen dependent    Rx / DC Orders ED Discharge Orders     None        Rea Reser, Barbette Hair, MD 01/08/21 [phone]

## 2021-01-08 NOTE — ED Provider Notes (Signed)
Powers out of the patient's house.  Awaiting return to power as patient is on chronic oxygen with concentrator.  Patient reports she is doing all right.  She brought her home medications.  He is waiting for her significant other to let her know when the power is back on but is not on yet. Physical Exam  BP (!) 124/56   Pulse 79   Temp 98.8 F (37.1 C) (Oral)   Resp 17   Ht 5\' 5"  (1.651 m)   Wt 136.1 kg   SpO2 97%   BMI 49.92 kg/m   Physical Exam  ED Course/Procedures     Procedures  MDM  No distress.  Patient is otherwise well in appearance.  Vital signs stable.  Okay for patient to take home medications which she has brought with her.  No immediate needs.       Charlesetta Shanks, MD 01/08/21 [phone]

## 2021-01-10 DIAGNOSIS — I110 Hypertensive heart disease with heart failure: Secondary | ICD-10-CM | POA: Diagnosis not present

## 2021-01-11 ENCOUNTER — Other Ambulatory Visit: Payer: Self-pay

## 2021-01-11 ENCOUNTER — Other Ambulatory Visit: Payer: Medicaid Other | Admitting: Obstetrics and Gynecology

## 2021-01-11 ENCOUNTER — Other Ambulatory Visit: Payer: Self-pay | Admitting: Obstetrics and Gynecology

## 2021-01-11 DIAGNOSIS — I110 Hypertensive heart disease with heart failure: Secondary | ICD-10-CM | POA: Diagnosis not present

## 2021-01-11 NOTE — Patient Outreach (Signed)
Care Coordination  01/11/2021  Michele Owens [birth year] [id number]  RNCM returned patient's phone call-patient updated me on new aide-[name].  [name] RN, BSN Reader  [facility] Managed [employer] High Risk [phone].

## 2021-01-11 NOTE — Patient Outreach (Signed)
Care Coordination  01/11/2021  Michele Owens [birth year] [id number]  RNCM returned patient's phone call and answered question-provided requested phone number.  [name] RN, BSN   [facility] - Managed [employer] High Risk [phone].

## 2021-01-12 DIAGNOSIS — I110 Hypertensive heart disease with heart failure: Secondary | ICD-10-CM | POA: Diagnosis not present

## 2021-01-13 DIAGNOSIS — I110 Hypertensive heart disease with heart failure: Secondary | ICD-10-CM | POA: Diagnosis not present

## 2021-01-14 DIAGNOSIS — I110 Hypertensive heart disease with heart failure: Secondary | ICD-10-CM | POA: Diagnosis not present

## 2021-01-15 ENCOUNTER — Other Ambulatory Visit: Payer: Self-pay

## 2021-01-15 ENCOUNTER — Emergency Department (HOSPITAL_COMMUNITY)
Admission: EM | Admit: 2021-01-15 | Discharge: 2021-01-16 | Disposition: A | Discharge status: Home / Self Care | Payer: Medicaid Other | Attending: Emergency Medicine | Admitting: Emergency Medicine

## 2021-01-15 DIAGNOSIS — Z87891 Personal history of nicotine dependence: Secondary | ICD-10-CM | POA: Insufficient documentation

## 2021-01-15 DIAGNOSIS — Z79899 Other long term (current) drug therapy: Secondary | ICD-10-CM | POA: Diagnosis not present

## 2021-01-15 DIAGNOSIS — M7989 Other specified soft tissue disorders: Secondary | ICD-10-CM | POA: Diagnosis not present

## 2021-01-15 DIAGNOSIS — M25571 Pain in right ankle and joints of right foot: Secondary | ICD-10-CM | POA: Diagnosis not present

## 2021-01-15 DIAGNOSIS — I5032 Chronic diastolic (congestive) heart failure: Secondary | ICD-10-CM | POA: Insufficient documentation

## 2021-01-15 DIAGNOSIS — Z7982 Long term (current) use of aspirin: Secondary | ICD-10-CM | POA: Insufficient documentation

## 2021-01-15 DIAGNOSIS — Z7951 Long term (current) use of inhaled steroids: Secondary | ICD-10-CM | POA: Diagnosis not present

## 2021-01-15 DIAGNOSIS — W19XXXA Unspecified fall, initial encounter: Secondary | ICD-10-CM | POA: Insufficient documentation

## 2021-01-15 DIAGNOSIS — J45909 Unspecified asthma, uncomplicated: Secondary | ICD-10-CM | POA: Insufficient documentation

## 2021-01-15 DIAGNOSIS — I110 Hypertensive heart disease with heart failure: Secondary | ICD-10-CM | POA: Diagnosis not present

## 2021-01-15 DIAGNOSIS — R5381 Other malaise: Secondary | ICD-10-CM | POA: Diagnosis not present

## 2021-01-15 DIAGNOSIS — E119 Type 2 diabetes mellitus without complications: Secondary | ICD-10-CM | POA: Diagnosis not present

## 2021-01-15 DIAGNOSIS — X501XXA Overexertion from prolonged static or awkward postures, initial encounter: Secondary | ICD-10-CM | POA: Diagnosis not present

## 2021-01-15 DIAGNOSIS — S93601A Unspecified sprain of right foot, initial encounter: Secondary | ICD-10-CM | POA: Diagnosis not present

## 2021-01-15 DIAGNOSIS — S99911A Unspecified injury of right ankle, initial encounter: Secondary | ICD-10-CM | POA: Diagnosis present

## 2021-01-15 DIAGNOSIS — S99921A Unspecified injury of right foot, initial encounter: Secondary | ICD-10-CM | POA: Diagnosis not present

## 2021-01-15 DIAGNOSIS — Z7984 Long term (current) use of oral hypoglycemic drugs: Secondary | ICD-10-CM | POA: Insufficient documentation

## 2021-01-15 DIAGNOSIS — Z743 Need for continuous supervision: Secondary | ICD-10-CM | POA: Diagnosis not present

## 2021-01-15 DIAGNOSIS — Z85850 Personal history of malignant neoplasm of thyroid: Secondary | ICD-10-CM | POA: Insufficient documentation

## 2021-01-15 DIAGNOSIS — J449 Chronic obstructive pulmonary disease, unspecified: Secondary | ICD-10-CM | POA: Insufficient documentation

## 2021-01-15 DIAGNOSIS — S93401A Sprain of unspecified ligament of right ankle, initial encounter: Secondary | ICD-10-CM | POA: Insufficient documentation

## 2021-01-15 NOTE — ED Provider Notes (Signed)
Emergency Medicine Provider Triage Evaluation Note  Michele Owens , a 60 y.o. female  was evaluated in triage.  Pt complains of fall.  Hurt her right foot, ankle.  Has been unable to walk since.  Typically walks with a cane.  She denies hitting her head, LOC or anticoagulation.  She denies any syncope, headache, paresthesias, weakness, chest pain, abdominal pain. Wear supplemental oxygen at baseline  Review of Systems  Positive: Fall, right ankle foot pain Negative: Numbness, weakness  Physical Exam  BP 120/69 (BP Location: Left Arm)   Pulse 96   Temp 98.5 F (36.9 C) (Oral)   Resp 18   SpO2 100%  Gen:   Awake, no distress   Resp:  Normal effort  MSK:   Moves extremities without difficulty, diffuse tenderness to right ankle, foot.  Nontender proximal, midshaft Other:    Medical Decision Making  Medically screening exam initiated at 11:36 PM.  Appropriate orders placed.  Michele Owens was informed that the remainder of the evaluation will be completed by another provider, this initial triage assessment does not replace that evaluation, and the importance of remaining in the ED until their evaluation is complete.  Fall, ankle, foot pain     [name], PA-C 01/15/21 [phone]    Michele Greek, MD 01/16/21 [phone]

## 2021-01-15 NOTE — ED Triage Notes (Signed)
Pt BIB [facility] c/o mechanical fall when getting up and c/o right foot pain. No other injuries. No obvious deformity.

## 2021-01-16 ENCOUNTER — Emergency Department (HOSPITAL_COMMUNITY): Payer: Medicaid Other

## 2021-01-16 DIAGNOSIS — Z7689 Persons encountering health services in other specified circumstances: Secondary | ICD-10-CM | POA: Diagnosis not present

## 2021-01-16 DIAGNOSIS — M25571 Pain in right ankle and joints of right foot: Secondary | ICD-10-CM | POA: Diagnosis not present

## 2021-01-16 DIAGNOSIS — S93401A Sprain of unspecified ligament of right ankle, initial encounter: Secondary | ICD-10-CM | POA: Diagnosis not present

## 2021-01-16 DIAGNOSIS — S99921A Unspecified injury of right foot, initial encounter: Secondary | ICD-10-CM | POA: Diagnosis not present

## 2021-01-16 DIAGNOSIS — M7989 Other specified soft tissue disorders: Secondary | ICD-10-CM | POA: Diagnosis not present

## 2021-01-16 MED ORDER — OXYCODONE-ACETAMINOPHEN 5-325 MG PO TABS
2.0000 | ORAL_TABLET | Freq: Once | ORAL | Status: AC
Start: 2021-01-16 — End: 2021-01-16
  Administered 2021-01-16: 2 via ORAL
  Filled 2021-01-16: qty 2

## 2021-01-16 MED ORDER — FUROSEMIDE 10 MG/ML IJ SOLN
40.0000 mg | Freq: Once | INTRAMUSCULAR | Status: AC
  Administered 2021-01-16: 40 mg via INTRAVENOUS
  Filled 2021-01-16: qty 4

## 2021-01-16 NOTE — ED Notes (Signed)
Pt given warm blanket, diet ginger ale, ice, graham crackers as requested. Bedside commode placed at bedside, call bell within reach for pt to call when she needs assist to bedside commode

## 2021-01-16 NOTE — ED Notes (Signed)
ED Provider at bedside. 

## 2021-01-16 NOTE — ED Notes (Signed)
Confirmed pt address for dispo, [facility] called (pt needs 4 liters O2 for transport)

## 2021-01-16 NOTE — ED Provider Notes (Signed)
[facility] EMERGENCY DEPARTMENT Provider Note   CSN: [id number] Arrival date & time: 01/15/21  2311     History Chief Complaint  Patient presents with   Michele Owens    Michele Owens is a 60 y.o. female.  Patient presents to the emergency department with right foot and ankle injury after a fall.  Patient reports that she normally walks with a cane.  She is not sure exactly what happened but she twisted her leg and it went out from under her.  Patient indicates pain and swelling of the top part of her foot and in the ankle.  She did not hit her head or lose consciousness.  Patient does, however, think that both of her legs are more swollen than usual.  She has gained at least 4 pounds.  She reports that she has a history of congestive heart failure and when this occurs she normally needs a dose of IV Lasix.  She is not feeling any more short of breath than usual.  No chest pain.      Past Medical History:  Diagnosis Date   Anxiety    Arthritis    Asthma    Chronic diastolic CHF (congestive heart failure) (HCC)    COPD (chronic obstructive pulmonary disease) (HCC)    Uses Oxygen at night   Depression    Diabetes mellitus without complication (HCC)    Dyspnea    occ   Family history of thyroid cancer    GERD (gastroesophageal reflux disease)    Gout    Headache    migraines   History of DVT of lower extremity    History of nuclear stress test    Myoview 2/17:  Low risk stress nuclear study with a small, moderate intensity, partially reversible inferior lateral defect consistent with small prior infarct and minimal peri-infarct ischemia; EF 68 with normal wall motion   Hypertension    Pneumonia    Thyroid cancer ([place]) 09/23/2016    Patient Active Problem List   Diagnosis Date Noted   Respiratory failure with hypercapnia ([place]) 12/23/2020   Anemia 12/23/2020   Acute on chronic respiratory failure with hypoxia and hypercapnia ([place]) 04/18/2020   Acute on chronic  respiratory failure with hypoxia ([place]) 04/18/2020   Hemoptysis 47/42/5956   Acute metabolic encephalopathy 38/75/6433   Acute encephalopathy 04/18/2020   Chronic diastolic CHF (congestive heart failure) ([place]) 04/18/2020   Sepsis ([place]) 04/18/2020   Morbid obesity with BMI of 50.0-59.9, adult ([place])    Fall at home, initial encounter 06/23/2018   HNP (herniated nucleus pulposus), thoracic 06/23/2018   CHF (congestive heart failure) ([place]) 06/18/2018   Hypertensive disorder 08/28/2017   Obesity 08/28/2017   COPD exacerbation ([place]) 08/04/2017   Acute and chronic respiratory failure with hypercapnia ([place])    Pulmonary HTN ([place])    Genetic testing 02/27/2017   Family history of thyroid cancer    Hypomagnesemia 10/03/2016   Atypical chest pain    HLD (hyperlipidemia) 09/23/2016   Gout 09/23/2016   DVT (deep vein thrombosis) in pregnancy 09/23/2016   Thyroid cancer ([place]) 09/23/2016   Hypocalcemia 09/23/2016   AKI (acute kidney injury) ([place]) 09/23/2016   Acute pulmonary edema ([place])    Acute hypoxemic respiratory failure ([place])    Ventilator dependent ([place])    Chronic respiratory failure with hypoxia and hypercapnia ([place]) 07/16/2016   CAP (community acquired pneumonia) 01/23/2016   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 01/23/2016   Pulmonary hypertension ([place]) 01/23/2016  Obesity hypoventilation syndrome ([place]) 08/26/2015   Dyslipidemia associated with type 2 diabetes mellitus ([place]) 04/24/2015   Leukocytosis 04/24/2015   Controlled type 2 diabetes mellitus without complication, without long-term current use of insulin ([place]) 04/24/2015   Anxiety 04/24/2015   Benign essential HTN 04/24/2015   Normocytic anemia 04/24/2015   OSA (obstructive sleep apnea) 05/10/2012   Tobacco abuse 07/04/2011    Past Surgical History:  Procedure Laterality Date   BUNIONECTOMY Bilateral    CARDIAC CATHETERIZATION N/A 06/23/2015   Procedure: Right/Left Heart Cath and Coronary Angiography;   Surgeon: [name], MD;  Location: [place] CV [facility];  Service: Cardiovascular;  Laterality: N/A;   COLONOSCOPY WITH PROPOFOL N/A 12/15/2015   Procedure: COLONOSCOPY WITH PROPOFOL;  Surgeon: [name], MD;  Location: [place];  Service: Endoscopy;  Laterality: N/A;   RIGHT HEART CATH N/A 10/01/2019   Procedure: RIGHT HEART CATH;  Surgeon: [name], MD;  Location: [place] [facility];  Service: Cardiovascular;  Laterality: N/A;   THYROIDECTOMY  09/19/2016   THYROIDECTOMY N/A 09/19/2016   Procedure: TOTAL THYROIDECTOMY;  Surgeon: [name], MD;  Location: [place];  Service: General;  Laterality: N/A;   TONSILLECTOMY     TOTAL ABDOMINAL HYSTERECTOMY  07/14/10     OB History   No obstetric history on file.     Family History  Problem Relation Age of Onset   Cancer Father        thought to be due to exposure to concrete   Diabetes Mother    Thyroid cancer Mother        dx in her 18s-60s   Cancer Maternal Uncle        2 uncles with cancer NOS   Brain cancer Paternal Aunt    Cancer Cousin        maternal first cousin - NOS   Cancer Cousin        maternal first cousin - NOS    Social History   Tobacco Use   Smoking status: Former    Packs/day: 0.50    Years: 41.00    Pack years: 20.50    Types: Cigarettes    Quit date: 07/2016    Years since quitting: 4.5   Smokeless tobacco: Never   Tobacco comments:    Starting to [employer] -- using Nicotine Patch  Vaping Use   Vaping Use: Never used  Substance Use Topics   Alcohol use: Not Currently    Alcohol/week: 0.0 standard drinks    Comment: ? none now   Drug use: Never    Comment: none for long time    Home Medications Prior to Admission medications   Medication Sig Start Date End Date Taking? Authorizing Provider  albuterol (PROVENTIL HFA;VENTOLIN HFA) 108 (90 Base) MCG/ACT inhaler Inhale 1-2 puffs into the lungs every 6 (six) hours as needed for wheezing or shortness of breath. 04/23/18   Long, [name], MD   allopurinol (ZYLOPRIM) 100 MG tablet Take 100 mg by mouth daily.    [provider]  aspirin EC 81 MG tablet Take 81 mg by mouth daily. Swallow whole.    [provider]  benzonatate (TESSALON) 200 MG capsule Take 1 capsule (200 mg total) by mouth in the morning, at noon, and at bedtime. 7 DS Patient taking differently: Take 200 mg by mouth 3 (three) times daily as needed for cough. 04/13/20   Regalado, Belkys A, MD  blood glucose meter kit and supplies KIT Dispense based  on patient and insurance preference. Use up to four times daily as directed. 12/25/20   Little Ishikawa, MD  busPIRone (BUSPAR) 5 MG tablet Take 5 mg by mouth 3 (three) times daily as needed (anxiety).    [provider]  Butalbital-APAP-Caffeine 50-300-40 MG CAPS Take 1 capsule by mouth 2 (two) times daily as needed for headache.    [provider]  calcitRIOL (ROCALTROL) 0.5 MCG capsule Take 1 capsule (0.5 mcg total) by mouth daily. 09/26/16   Hongalgi, [name], MD  carvedilol (COREG) 6.25 MG tablet Take 1 tablet (6.25 mg total) by mouth 2 (two) times daily with a meal. 01/23/18   Bensimhon, [name], MD  clonazePAM (KLONOPIN) 2 MG tablet Take 1 tablet (2 mg total) by mouth 3 (three) times daily. for anxiety Patient taking differently: Take 2 mg by mouth 3 (three) times daily. 06/23/19   [name], MD  cyclobenzaprine (FLEXERIL) 10 MG tablet Take 10 mg by mouth 3 (three) times daily. 11/19/19   [provider]  diclofenac sodium (VOLTAREN) 1 % GEL Apply 2 g topically 4 (four) times daily as needed (for pain). 06/19/18   [name], MD  EPINEPHrine 0.3 mg/0.3 mL IJ SOAJ injection Inject 0.3 mg into the muscle once as needed for anaphylaxis.    [provider]  FEROSUL 325 (65 Fe) MG tablet Take 325 mg by mouth daily. 03/22/17   [provider]  glucose blood test strip Use as instructed to check blood sugar up to 4 times daily Patient taking differently: 1  each by Other route See admin instructions. Use as instructed to check blood sugar up to 4 times daily 08/06/17   [name], DO  ipratropium-albuterol (DUONEB) 0.5-2.5 (3) MG/3ML SOLN Take 3 mLs by nebulization 4 (four) times daily.  04/05/17   [provider]  levothyroxine (SYNTHROID, LEVOTHROID) 112 MCG tablet Take 224 mcg by mouth daily before breakfast. 07/11/17   [provider]  lisinopril (PRINIVIL,ZESTRIL) 5 MG tablet Take 1 tablet (5 mg total) by mouth daily. Please call for office visit [phone] Patient taking differently: Take 5 mg by mouth daily. 08/15/17   [name], MD  metFORMIN (GLUCOPHAGE) 500 MG tablet Take 1 tablet (500 mg total) by mouth 2 (two) times daily with a meal. 12/11/14   [name], MD  nitroGLYCERIN (NITROSTAT) 0.4 MG SL tablet Place 1 tablet (0.4 mg total) under the tongue every 5 (five) minutes as needed for chest pain. 06/19/18   [name], MD  oxyCODONE-acetaminophen (PERCOCET) 10-325 MG tablet Take 1 tablet by mouth every 6 (six) hours as needed for pain. 12/03/19   [provider]  OXYGEN Inhale 4 L into the lungs continuous.    [provider]  potassium chloride SA (K-DUR,KLOR-CON) 20 MEQ tablet Take 1 tablet (20 mEq total) by mouth 2 (two) times daily. 07/24/18   [name], MD  predniSONE (DELTASONE) 20 MG tablet Take 2 tablets (40 mg total) by mouth daily. Patient not taking: No sig reported 11/11/20   Horton, Barbette Hair, MD  rosuvastatin (CRESTOR) 10 MG tablet Take 10 mg by mouth every evening. 11/19/19   [provider]  senna-docusate (SENOKOT-S) 8.6-50 MG tablet Take 1 tablet by mouth daily.    [provider]  torsemide (DEMADEX) 20 MG tablet Take 2 tablets (40 mg total) by mouth 2 (two) times daily. 12/17/20   [name], MD  traZODone (DESYREL) 50 MG tablet Take 100  mg by mouth at bedtime. 11/19/19   [provider]  triamcinolone cream (KENALOG) 0.1 % Apply 1  application topically 2 (two) times daily. 09/10/20   [provider]  umeclidinium-vilanterol (ANORO ELLIPTA) 62.5-25 MCG/INH AEPB Inhale 1 puff into the lungs daily.    [provider]    Allergies    Bee venom, Fioricet [butalbital-apap-caffeine], Ibuprofen, Lamisil [terbinafine], and Nsaids  Review of Systems   Review of Systems  Cardiovascular:  Positive for leg swelling.  Musculoskeletal:  Positive for arthralgias.  All other systems reviewed and are negative.  Physical Exam Updated Vital Signs BP 117/65 (BP Location: Right Arm)   Pulse 93   Temp 98.8 F (37.1 C) (Oral)   Resp 16   SpO2 100%   Physical Exam Vitals and nursing note reviewed.  Constitutional:      General: She is not in acute distress.    Appearance: Normal appearance. She is well-developed.  HENT:     Head: Normocephalic and atraumatic.     Right Ear: Hearing normal.     Left Ear: Hearing normal.     Nose: Nose normal.  Eyes:     Conjunctiva/sclera: Conjunctivae normal.     Pupils: Pupils are equal, round, and reactive to light.  Cardiovascular:     Rate and Rhythm: Regular rhythm.     Heart sounds: S1 normal and S2 normal. No murmur heard.   No friction rub. No gallop.  Pulmonary:     Effort: Pulmonary effort is normal. No respiratory distress.     Breath sounds: Normal breath sounds.  Chest:     Chest wall: No tenderness.  Abdominal:     General: Bowel sounds are normal.     Palpations: Abdomen is soft.     Tenderness: There is no abdominal tenderness. There is no guarding or rebound. Negative signs include Murphy's sign and McBurney's sign.     Hernia: No hernia is present.  Musculoskeletal:     Cervical back: Normal range of motion and neck supple.     Right lower leg: Edema present.     Left lower leg: Edema present.     Right ankle: Swelling present. No deformity or ecchymosis. Tenderness present. Decreased range of motion.     Right foot: Tenderness present. No  swelling or deformity.  Skin:    General: Skin is warm and dry.     Findings: No rash.  Neurological:     Mental Status: She is alert and oriented to person, place, and time.     GCS: GCS eye subscore is 4. GCS verbal subscore is 5. GCS motor subscore is 6.     Cranial Nerves: No cranial nerve deficit.     Sensory: No sensory deficit.     Coordination: Coordination normal.  Psychiatric:        Speech: Speech normal.        Behavior: Behavior normal.        Thought Content: Thought content normal.    ED Results / Procedures / Treatments   Labs (all labs ordered are listed, but only abnormal results are displayed) Labs Reviewed - No data to display  EKG None  Radiology DG Ankle Complete Right  Result Date: 01/16/2021 CLINICAL DATA:  Fall, right ankle pain EXAM: RIGHT ANKLE - COMPLETE 3+ VIEW COMPARISON:  None. FINDINGS: Normal alignment. No acute fracture or dislocation. Ankle mortise is aligned. Corticated density seen inferomedial to the medial malleolus may represent an accessory ossicle or the  sequela of remote injury. Moderate soft tissue swelling superficial to the lateral malleolus. No ankle effusion. IMPRESSION: Lateral soft tissue swelling.  No fracture or dislocation. Electronically Signed   By: Fidela Salisbury M.D.   On: 01/16/2021 00:44   DG Foot Complete Right  Result Date: 01/16/2021 CLINICAL DATA:  Fall, right foot injury EXAM: RIGHT FOOT COMPLETE - 3+ VIEW COMPARISON:  None. FINDINGS: Three view radiograph right foot demonstrates a healed fracture of the first metatarsal in normal alignment. No acute fracture or dislocation. 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 Small plantar calcaneal spur. No ankle effusion. Soft tissues are unremarkable. IMPRESSION: No acute fracture or dislocation. Electronically Signed   By: Fidela Salisbury M.D.   On: 01/16/2021 00:43    Procedures Procedures   Medications Ordered in ED Medications  furosemide (LASIX) injection 40 mg  (has no administration in time range)    ED Course  I have reviewed the triage vital signs and the nursing notes.  Pertinent labs & imaging results that were available during my care of the patient were reviewed by me and considered in my medical decision making (see chart for details).    MDM Rules/Calculators/A&P                           Patient initially presented with complaints of pain in her right foot and ankle after a fall.  She suffered a mechanical fall earlier, is having trouble ambulating because of pain.  She does have some mild swelling, x-rays are negative.  Additionally, patient complaining of increased swelling consistent with mild volume overload from her congestive heart failure.  Lungs are clear.  She is on her normal supplemental oxygen without hypoxia.  Will give Lasix, discharged.  Final Clinical Impression(s) / ED Diagnoses Final diagnoses:  Foot sprain, right, initial encounter  Sprain of right ankle, unspecified ligament, initial encounter    Rx / DC Orders ED Discharge Orders     None        Lebert Lovern, Gwenyth Allegra, MD 01/16/21 [phone]

## 2021-01-16 NOTE — ED Notes (Signed)
Called [facility] to transport patient to address on file

## 2021-01-17 DIAGNOSIS — I110 Hypertensive heart disease with heart failure: Secondary | ICD-10-CM | POA: Diagnosis not present

## 2021-01-18 ENCOUNTER — Ambulatory Visit: Payer: Self-pay

## 2021-01-18 ENCOUNTER — Telehealth: Payer: Self-pay | Admitting: Pharmacist

## 2021-01-18 DIAGNOSIS — I110 Hypertensive heart disease with heart failure: Secondary | ICD-10-CM | POA: Diagnosis not present

## 2021-01-18 NOTE — Patient Outreach (Signed)
01/18/2021 Name: Michele Owens MRN: [id number] DOB: May 15, 1960  Referred by: [name], MD Reason for referral : No chief complaint on file.    A successful outreach was attempted today. Spoke with patient on phone and she was not aware of appointment and stated she just woke up from a nap and did not feel well. She requested we re-scheduled appointment as she stated she had things she needed to take care of.  Follow Up Plan:   Appointment re-scheduled for November 8th at 2:30 PM   [place], CPP [place] [phone]

## 2021-01-19 DIAGNOSIS — I110 Hypertensive heart disease with heart failure: Secondary | ICD-10-CM | POA: Diagnosis not present

## 2021-01-20 DIAGNOSIS — I110 Hypertensive heart disease with heart failure: Secondary | ICD-10-CM | POA: Diagnosis not present

## 2021-01-21 DIAGNOSIS — I499 Cardiac arrhythmia, unspecified: Secondary | ICD-10-CM | POA: Diagnosis not present

## 2021-01-21 DIAGNOSIS — I110 Hypertensive heart disease with heart failure: Secondary | ICD-10-CM | POA: Diagnosis not present

## 2021-01-21 DIAGNOSIS — R059 Cough, unspecified: Secondary | ICD-10-CM | POA: Diagnosis not present

## 2021-01-21 DIAGNOSIS — R457 State of emotional shock and stress, unspecified: Secondary | ICD-10-CM | POA: Diagnosis not present

## 2021-01-23 NOTE — Progress Notes (Deleted)
Electrophysiology Office Note:    Date:  01/23/2021   ID:  Michele Owens, DOB 03-Aug-1960, MRN [id number]  PCP:  [name], MD  [facility] Cardiologist:  None  [facility] Electrophysiologist:  None   Referring MD: [name], MD   Chief Complaint: SVT  History of Present Illness:    Michele Owens is a 60 y.o. female who presents for an evaluation of SVT at the request of Dr. Gilford Raid. Their medical history includes COPD on oxygen, diabetes, diabetes, hypertension, thyroid cancer.  The patient presented to the emergency department on January 05, 2021 with SVT.  Ventricular rate was in the 170s.  EMS treated her with 6 mg of IV adenosine.  She was having chest discomfort during the SVT.  She is chronically on 4 L of oxygen via nasal cannula.  She presented to the ER earlier in September (September 1) with a similar complaint.  Six mg of IV adenosine converted that episode to sinus rhythm as well.  She has previously been seen by Dr. Aundra Dubin in the heart failure clinic for her chronic combined systolic and diastolic heart failure.     Past Medical History:  Diagnosis Date   Anxiety    Arthritis    Asthma    Chronic diastolic CHF (congestive heart failure) (HCC)    COPD (chronic obstructive pulmonary disease) (HCC)    Uses Oxygen at night   Depression    Diabetes mellitus without complication (HCC)    Dyspnea    occ   Family history of thyroid cancer    GERD (gastroesophageal reflux disease)    Gout    Headache    migraines   History of DVT of lower extremity    History of nuclear stress test    Myoview 2/17:  Low risk stress nuclear study with a small, moderate intensity, partially reversible inferior lateral defect consistent with small prior infarct and minimal peri-infarct ischemia; EF 68 with normal wall motion   Hypertension    Pneumonia    Thyroid cancer ([place]) 09/23/2016    Past Surgical History:  Procedure Laterality Date   BUNIONECTOMY  Bilateral    CARDIAC CATHETERIZATION N/A 06/23/2015   Procedure: Right/Left Heart Cath and Coronary Angiography;  Surgeon: [name], MD;  Location: [place] [facility];  Service: Cardiovascular;  Laterality: N/A;   COLONOSCOPY WITH PROPOFOL N/A 12/15/2015   Procedure: COLONOSCOPY WITH PROPOFOL;  Surgeon: [name], MD;  Location: [place];  Service: Endoscopy;  Laterality: N/A;   RIGHT HEART CATH N/A 10/01/2019   Procedure: RIGHT HEART CATH;  Surgeon: [name], MD;  Location: [place] [facility] LAB;  Service: Cardiovascular;  Laterality: N/A;   THYROIDECTOMY  09/19/2016   THYROIDECTOMY N/A 09/19/2016   Procedure: TOTAL THYROIDECTOMY;  Surgeon: [name], MD;  Location: [place];  Service: General;  Laterality: N/A;   TONSILLECTOMY     TOTAL ABDOMINAL HYSTERECTOMY  07/14/10    Current Medications: No outpatient medications have been marked as taking for the 01/24/21 encounter (Appointment) with Vickie Epley, MD.     Allergies:   Bee venom, Fioricet [butalbital-apap-caffeine], Ibuprofen, Lamisil [terbinafine], and Nsaids   Social History   Socioeconomic History   Marital status: Single    Spouse name: Not on file   Number of children: Not on file   Years of education: Not on file   Highest education level: Not on file  Occupational History   Occupation: unemployeed  Tobacco Use   Smoking status:  Former    Packs/day: 0.50    Years: 41.00    Pack years: 20.50    Types: Cigarettes    Quit date: 07/2016    Years since quitting: 4.5   Smokeless tobacco: Never   Tobacco comments:    Starting to [employer] -- using Nicotine Patch  Vaping Use   Vaping Use: Never used  Substance and Sexual Activity   Alcohol use: Not Currently    Alcohol/week: 0.0 standard drinks    Comment: ? none now   Drug use: Never    Comment: none for long time   Sexual activity: Yes  Other Topics Concern   Not on file  Social History Narrative   Not on file   Social Determinants of  Health   Financial Resource Strain: Low Risk    Difficulty of Paying Living Expenses: Not very hard  Food Insecurity: No Food Insecurity   Worried About Charity fundraiser in the Last Year: Never true   Ran Out of Food in the Last Year: Never true  Transportation Needs: Unmet Transportation Needs   Lack of Transportation (Medical): Yes   Lack of Transportation (Non-Medical): Yes  Physical Activity: Inactive   Days of Exercise per Week: 0 days   Minutes of Exercise per Session: 0 min  Stress: Stress Concern Present   Feeling of Stress : Rather much  Social Connections: Moderately Isolated   Frequency of Communication with Friends and Family: More than three times a week   Frequency of Social Gatherings with Friends and Family: More than three times a week   Attends Religious Services: 1 to 4 times per year   Active Member of [employer] or Organizations: No   Attends Archivist Meetings: Never   Marital Status: Divorced     Family History: The patient's family history includes Brain cancer in her paternal aunt; Cancer in her cousin, cousin, father, and maternal uncle; Diabetes in her mother; Thyroid cancer in her mother.  ROS:   Please see the history of present illness.    All other systems reviewed and are negative.  EKGs/Labs/Other Studies Reviewed:    The following studies were reviewed today: ***  EKG:  The ekg ordered today demonstrates ***   Recent Labs: 12/24/2020: ALT 24; TSH 0.800 01/05/2021: B Natriuretic Peptide 7.5; BUN 14; Creatinine, Ser 0.72; Hemoglobin 12.0; Magnesium 1.8; Platelets 303; Potassium 3.8; Sodium 134  Recent Lipid Panel    Component Value Date/Time   CHOL 226 (H) 08/05/2017 0318   TRIG 59 08/05/2017 0318   HDL 80 08/05/2017 0318   CHOLHDL 2.8 08/05/2017 0318   VLDL 12 08/05/2017 0318   LDLCALC 134 (H) 08/05/2017 0318    Physical Exam:    VS:  There were no vitals taken for this visit.    Wt Readings from Last 3 Encounters:   01/08/21 300 lb (136.1 kg)  01/05/21 (!) 306 lb 10.6 oz (139.1 kg)  12/23/20 (!) 313 lb 0.9 oz (142 kg)     GEN: *** Well nourished, well developed in no acute distress HEENT: Normal NECK: No JVD; No carotid bruits LYMPHATICS: No lymphadenopathy CARDIAC: ***RRR, no murmurs, rubs, gallops RESPIRATORY:  Clear to auscultation without rales, wheezing or rhonchi  ABDOMEN: Soft, non-tender, non-distended MUSCULOSKELETAL:  No edema; No deformity  SKIN: Warm and dry NEUROLOGIC:  Alert and oriented x 3 PSYCHIATRIC:  Normal affect       ASSESSMENT:    No diagnosis found. PLAN:    In  order of problems listed above:    Flecainide     Total time spent with patient today *** minutes. This includes reviewing records, evaluating the patient and coordinating care.  Medication Adjustments/Labs and Tests Ordered: Current medicines are reviewed at length with the patient today.  Concerns regarding medicines are outlined above.  No orders of the defined types were placed in this encounter.  No orders of the defined types were placed in this encounter.    Signed, Hilton Cork. Quentin Ore, MD, [facility], [facility] 01/23/2021 7:46 PM    Electrophysiology [facility]

## 2021-01-24 ENCOUNTER — Institutional Professional Consult (permissible substitution): Payer: Medicaid Other | Admitting: Cardiology

## 2021-01-24 DIAGNOSIS — I110 Hypertensive heart disease with heart failure: Secondary | ICD-10-CM | POA: Diagnosis not present

## 2021-01-25 DIAGNOSIS — I110 Hypertensive heart disease with heart failure: Secondary | ICD-10-CM | POA: Diagnosis not present

## 2021-01-26 ENCOUNTER — Other Ambulatory Visit: Payer: Self-pay

## 2021-01-26 ENCOUNTER — Other Ambulatory Visit: Payer: Self-pay | Admitting: Obstetrics and Gynecology

## 2021-01-26 DIAGNOSIS — I110 Hypertensive heart disease with heart failure: Secondary | ICD-10-CM | POA: Diagnosis not present

## 2021-01-26 NOTE — Patient Outreach (Signed)
Medicaid Managed Care   Nurse Care Manager Note  01/26/2021 Name:  Michele Owens MRN:  [id number] DOB:  1961/03/21  Michele Owens is an 60 y.o. year old female who is a primary patient of Michele Reach, MD.  The [facility] Managed Care Coordination team was consulted for assistance with:    Chronic healthcare management needs.  [name] was given information about Medicaid Managed Care Coordination team services today. Michele Owens Patient agreed to services and verbal consent obtained.  Engaged with patient by telephone for follow up visit in response to provider referral for case management and/or care coordination services.   Assessments/Interventions:  Review of past medical history, allergies, medications, health status, including review of consultants reports, laboratory and other test data, was performed as part of comprehensive evaluation and provision of chronic care management services.  SDOH (Social Determinants of Health) assessments and interventions performed: SDOH Interventions    Flowsheet Row Most Recent Value  SDOH Interventions   Food Insecurity Interventions Intervention Not Indicated       Care Plan  Allergies  Allergen Reactions   Bee Venom Swelling and Other (See Comments)    "All over my body" (swelling)   Fioricet [Butalbital-Apap-Caffeine] Nausea And Vomiting and Rash   Ibuprofen Rash and Other (See Comments)    Severe rash   Lamisil [Terbinafine] Rash and Other (See Comments)    Pt states this causes her to "feel funny"   Nsaids Other (See Comments)    Per MD's orders     Medications Reviewed Today     Reviewed by Michele Medicus, RN (Registered Nurse) on 01/26/21 at [place] List Status: <None>   Medication Order Taking? Sig Documenting Provider Last Dose Status Informant  albuterol (PROVENTIL HFA;VENTOLIN HFA) 108 (90 Base) MCG/ACT inhaler [id number] No Inhale 1-2 puffs into the lungs every 6 (six) hours as needed for wheezing or  shortness of breath. Michele Fast, MD Past Week Active Self           Med Note Michele Owens   EXH Jan 08, 2021 12:45 PM)    allopurinol (ZYLOPRIM) 100 MG tablet [id number] No Take 100 mg by mouth daily. [provider] 01/07/2021 Active Self           Med Note Michele Owens   FYB Jan 08, 2021 12:45 PM)    aspirin EC 81 MG tablet [id number] No Take 81 mg by mouth daily. Swallow whole. [provider] 01/08/2021 Active Self  benzonatate (TESSALON) 200 MG capsule [id number] No Take 1 capsule (200 mg total) by mouth in the morning, at Owens, and at bedtime. 7 DS  Patient taking differently: Take 200 mg by mouth 3 (three) times daily as needed for cough.   Michele Shiley, MD Past Month Active Self           Med Note Michele Owens   NTI Jan 08, 2021 12:45 PM)    blood glucose meter kit and supplies KIT [id number]  Dispense based on patient and insurance preference. Use up to four times daily as directed. Michele Ishikawa, MD  Active Self  busPIRone (BUSPAR) 5 MG tablet [id number] No Take 5 mg by mouth 3 (three) times daily as needed (anxiety). [provider] 01/08/2021 Active Self  Butalbital-APAP-Caffeine 50-300-40 MG CAPS [id number] No Take 1 capsule by mouth 2 (two) times daily as needed for headache. [provider] Past Week Active Self  calcitRIOL (ROCALTROL) 0.5 MCG capsule [id number] No  Take 1 capsule (0.5 mcg total) by mouth daily. Michele Jansky, MD 01/08/2021 Active Self           Med Note Michele Owens   CWU Jan 08, 2021 12:49 PM)    carvedilol (COREG) 6.25 MG tablet [id number] No Take 1 tablet (6.25 mg total) by mouth 2 (two) times daily with a meal. Bensimhon, Michele Pascal, MD 01/08/2021 0300 Active Self           Med Note Michele Owens   TUU Jan 08, 2021 12:53 PM)    clonazePAM (KLONOPIN) 2 MG tablet [id number] No Take 1 tablet (2 mg total) by mouth 3 (three) times daily. for anxiety  Patient taking differently: Take 2 mg by mouth 3 (three)  times daily.   Michele Pence, MD 08/13/2020 Unknown time Active Self           Med Note Michele Owens   PHX Jan 08, 2021  1:03 PM) Pt is waiting on mail order to send, has not gotten it yet  cyclobenzaprine (FLEXERIL) 10 MG tablet [id number] No Take 10 mg by mouth 3 (three) times daily. [provider] 01/08/2021 Active Self           Med Note Michele Owens   AXK Jan 08, 2021 12:54 PM)    diclofenac sodium (VOLTAREN) 1 % GEL [id number] No Apply 2 g topically 4 (four) times daily as needed (for pain). Michele Morton, MD 01/08/2021 Active Self  EPINEPHrine 0.3 mg/0.3 mL IJ SOAJ injection [id number] No Inject 0.3 mg into the muscle once as needed for anaphylaxis. [provider] unk Active Self           Med Note Michele Owens   EFE Jan 08, 2021 12:50 PM)    FEROSUL 325 (65 Fe) MG tablet [id number] No Take 325 mg by mouth daily. [provider] 01/08/2021 Active Self           Med Note Michele Owens   ITG Jan 08, 2021 12:53 PM)    glucose blood test strip [id number] No Use as instructed to check blood sugar up to 4 times daily  Patient taking differently: 1 each by Other route See admin instructions. Use as instructed to check blood sugar up to 4 times daily   Michele Phi, DO unk unk Active Self  ipratropium-albuterol (DUONEB) 0.5-2.5 (3) MG/3ML SOLN [id number] No Take 3 mLs by nebulization 4 (four) times daily.  [provider] 01/07/2021 Active Self           Med Note Michele Owens   GSU Jan 08, 2021  1:02 PM)    levothyroxine (SYNTHROID, LEVOTHROID) 112 MCG tablet [id number] No Take 224 mcg by mouth daily before breakfast. [provider] 01/08/2021 Active Self           Med Note Michele Owens   Sat Jan 08, 2021 12:48 PM)    lisinopril (PRINIVIL,ZESTRIL) 5 MG tablet [id number] No Take 1 tablet (5 mg total) by mouth daily. Please call for office visit [phone]  Patient taking differently: Take 5 mg by mouth daily.   [name], MD  01/08/2021 Active Self           Med Note Michele Owens   AFB Jan 08, 2021 12:52 PM)    metFORMIN (GLUCOPHAGE) 500 MG tablet [id number] No Take 1 tablet (500 mg total) by mouth 2 (two) times daily with a meal. Michele Redwood, MD  01/08/2021 Active Self           Med Note Michele Owens, RACHEL J   Sat Jan 08, 2021 12:53 PM)    nitroGLYCERIN (NITROSTAT) 0.4 MG SL tablet [id number] No Place 1 tablet (0.4 mg total) under the tongue every 5 (five) minutes as needed for chest pain. Michele Morton, MD unk Active Self           Med Note Michele Owens   AQT Jan 08, 2021 12:59 PM)    oxyCODONE-acetaminophen (PERCOCET) 10-325 MG tablet [id number] No Take 1 tablet by mouth every 6 (six) hours as needed for pain. [provider] 01/08/2021 Active Self           Med Note Michele Owens   TGY Jan 08, 2021 12:58 PM)    OXYGEN [id number] No Inhale 4 L into the lungs continuous. [provider] 01/08/2021 Active Self           Med Note Corky Mull   Mon Apr 12, 2020 10:08 AM)    potassium chloride SA (K-DUR,KLOR-CON) 20 MEQ tablet [id number] No Take 1 tablet (20 mEq total) by mouth 2 (two) times daily. [name], MD 01/08/2021 Active Self           Med Note Michele Owens   WIO Jan 08, 2021 12:52 PM)    predniSONE (DELTASONE) 20 MG tablet [id number] No Take 2 tablets (40 mg total) by mouth daily.  Patient not taking: No sig reported   Horton, Barbette Hair, MD Completed Course Active Self  rosuvastatin (CRESTOR) 10 MG tablet [id number] No Take 10 mg by mouth every evening. [provider] 01/07/2021 Active Self           Med Note Michele Owens   EHO Jan 08, 2021 12:55 PM)    senna-docusate (SENOKOT-S) 8.6-50 MG tablet [id number] No Take 1 tablet by mouth daily. [provider] 01/07/2021 Active Self           Med Note Michele Owens   BBC Jan 08, 2021 12:54 PM)    torsemide (DEMADEX) 20 MG tablet [id number] No Take 2 tablets (40 mg total) by mouth 2 (two) times daily.  [name], MD 01/08/2021 Active Self           Med Note Michele Owens   HWT Jan 08, 2021 12:51 PM)    traZODone (DESYREL) 50 MG tablet [id number] No Take 100 mg by mouth at bedtime. [provider] 01/07/2021 Active Self           Med Note Michele Owens   JZP Jan 08, 2021 12:54 PM)    triamcinolone cream (KENALOG) 0.1 % [id number]  Apply 1 application topically 2 (two) times daily. [provider]  Active Self           Med Note Rosine Beat Oct 04, 2020  8:40 PM) LF 09/10/2020 [facility] per external pharmacy records   umeclidinium-vilanterol [facility] ELLIPTA) 62.5-25 MCG/INH AEPB [id number] No Inhale 1 puff into the lungs daily. [provider] 01/08/2021 Active Self           Med Note Michele Owens   SMO Jan 08, 2021 12:50 PM)              Patient Active Problem List   Diagnosis Date Noted   Respiratory failure with hypercapnia ([place]) 12/23/2020   Anemia 12/23/2020   Acute on chronic respiratory failure  with hypoxia and hypercapnia ([place]) 04/18/2020   Acute on chronic respiratory failure with hypoxia ([place]) 04/18/2020   Hemoptysis 04/12/7251   Acute metabolic encephalopathy 66/44/0347   Acute encephalopathy 04/18/2020   Chronic diastolic CHF (congestive heart failure) ([place]) 04/18/2020   Sepsis ([place]) 04/18/2020   Morbid obesity with BMI of 50.0-59.9, adult ([place])    Fall at home, initial encounter 06/23/2018   HNP (herniated nucleus pulposus), thoracic 06/23/2018   CHF (congestive heart failure) ([place]) 06/18/2018   Hypertensive disorder 08/28/2017   Obesity 08/28/2017   COPD exacerbation ([place]) 08/04/2017   Acute and chronic respiratory failure with hypercapnia ([place])    Pulmonary HTN ([place])    Genetic testing 02/27/2017   Family history of thyroid cancer    Hypomagnesemia 10/03/2016   Atypical chest pain    HLD (hyperlipidemia) 09/23/2016   Gout 09/23/2016   DVT (deep vein thrombosis) in pregnancy 09/23/2016   Thyroid cancer  ([place]) 09/23/2016   Hypocalcemia 09/23/2016   AKI (acute kidney injury) ([place]) 09/23/2016   Acute pulmonary edema (HCC)    Acute hypoxemic respiratory failure (HCC)    Ventilator dependent (HCC)    Chronic respiratory failure with hypoxia and hypercapnia ([place]) 07/16/2016   CAP (community acquired pneumonia) 01/23/2016   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 01/23/2016   Pulmonary hypertension ([place]) 01/23/2016   Obesity hypoventilation syndrome ([place]) 08/26/2015   Dyslipidemia associated with type 2 diabetes mellitus ([place]) 04/24/2015   Leukocytosis 04/24/2015   Controlled type 2 diabetes mellitus without complication, without long-term current use of insulin ([place]) 04/24/2015   Anxiety 04/24/2015   Benign essential HTN 04/24/2015   Normocytic anemia 04/24/2015   OSA (obstructive sleep apnea) 05/10/2012   Tobacco abuse 07/04/2011    Conditions to be addressed/monitored per PCP order:   chronic healthcare management needs,  OSA,   Dyslipidemia associated with type 2 diabetes mellitus ([place])   Controlled type 2 diabetes mellitus without complication, without long-term current use of insulin ([place])   Anxiety   Obesity hypoventilation syndrome ([place])   CAP (community acquired pneumonia)   Pulmonary hypertension ([place])   Chronic respiratory failure with hypoxia and hypercapnia ([place])   HLD (hyperlipidemia)   Obesity   Morbid obesity with BMI of 50.0-59.9, adult  Care Plan : General Plan of Care (Adult)  Updates made by Michele Medicus, RN since 01/26/2021 12:00 AM     Problem: Barriers to Treatment   Priority: High  Onset Date: 04/23/2020     Long-Range Goal: Barriers to Treatment Identified and Managed   Start Date: 05/03/2020  Expected End Date: 02/26/2021  Recent Progress: On track  Priority: Medium  Note:   CARE PLAN ENTRY Medicaid Managed Care (see longitudinal plan of care for additional care plan information)  Current Barriers:  Transportation barriers-patient  utilizing [facility] and [employer] as needed. Limited access to caregiver-patient needs PCS, currently not receiving services she is expecting. Patient choosing not to use aides provided to her-discussed finding another agency with patient-using [facility]. Update 01/26/21;  Has aide Monday-Friday from 10-12  Nurse Clinical Goal(s):  Over the next 30 days, patient will work with [employer] to address needs related to transportation. Update 05/12/20:  Patient has used [employer] and is very pleased. Over the next 90 days, patient will attend all scheduled medical appointments. RNCM will make referral to SW for assistance with PCS Update 05/20/20:  Patient states she is now receiving PCS from [facility] 3 days a week for 4  hours each day. Update 05/27/20: BSW spoke with patient about PCS, she stated someone usually comes in 3 days a week for a few hours. She stated her aide was off today. BSW contacted [place] and spoke with [facility], she stated that patient has 13 hours per week with 6 days a week, however patient chooses to have an aide come out 3 days a week so that she can have more hours. The aide assist with most ADL's. Update 06/04/20: Patient stated she is ready to move to [facility]. She has already started the process but wants to save some money first and she has to wait on her nephew who will drive the uhaul truck. Update 06/24/20: BSW contacted patient to follow up on her moving to [facility], she stated she is still saving money. Patient did cut call short because she was sleeping, patient stated she was not in need of any resources at this time. Update 08/27/20: Bsw received information from [facility] about patient needing some batteries for her remote. BSW contacted patient to see if any resources were needed. Patient stated she contacted [employer] and they informed her it would cost 40 dollars to replace her remote, patient  states she only needs batteries. BSW asked if patient had anyone to call that could bring her some batteries, patient stated no. She states her daughter is mad at her right now and working so she does not talk to her, patient states she has not family to call. BSW asked if her aide could help, patient states she has not had an aide in about a week, she had to let her last aide go because she smelled like marijuana and they have not replaced her. BSW contacted patients healthcare agency [facility] at [phone] and left a voicemail. Update 09/30/20:  Patient has aides available to her at [facility] but chooses not to use them. Discussion with [facility] and they continue to look for another aide for Ms. Vasbinder. Discussed with patient about her finding a new agency. 10/05/20: BSW received a telephone call from [facility] with patient on the line. Patient stated that she went to the ED and her eye was still hurting from a fall. Patient stated she has the information for her eye doctor and was getting ready to contact them. Patient stated she has not recieieved an aide since last Thursday.  Update 10/26/20:  Patient now has aide coming M-F 10-12, "Lucy" Update 11/26/20:  Patient with no aide right now-has chosen not to utilize aides offered to her. Update 12/27/20:  Patient currently using [facility]?  Interventions: Inter-disciplinary care team collaboration (see longitudinal plan of care) Referred patient to community resources care guide team for assistance with transportation. Provided patient with information about [facility] and [employer]. Discussed plans with patient for ongoing care management follow up and provided patient with direct contact information for care management team Advised patient to call [place] or [employer]. Collaborated with [employer] and [place] regarding transportation. Assisted patient/caregiver with obtaining  information about health plan benefits Collaborated with Social Work regarding PCS. Update 08/30/20:  Patient continues to have issues with PCS-SW is investigating. Update 06/14/20:  Patient continues to utilize [employer], aide, [employer] as needed-has missed appointments due to transportation "no shows"-will continue to follow. Update 08/30/20:  patient has been successful in attending appointments with [facility] and [facility] transportation. Update 09/30/20:  Patient has upcoming appointments with [facility] Pharmacist and  SW. Patient Self Care Activities:  Patient will call [place]. Update 06/07/20:  Patient continues to utilize [facility] with success. Update 06/14/20:  Patient has missed appointments due to transportation no shows.  [facility] transportation has been utilized as needed. Update 09/30/20:  Patient with no transportation problems right now-uses [employer] or [place] as needed for appointments with success.   Care Plan : General Plan of Care (Adult)  Updates made by Michele Medicus, RN since 01/26/2021 12:00 AM     Problem: Health Promotion or Disease Self-Management (General Plan of Care)   Priority: High  Onset Date: 04/23/2020     Long-Range Goal: Self-Management Plan Developed   Start Date: 04/23/2020  Expected End Date: 02/26/2021  Recent Progress: On track  Priority: Medium  Note:   Current Barriers:  Chronic Disease Management support and education needs.  Patient with abdominal wall infection-seen at [facility] ED 05/24/20 - I&D performed.  Recently hospitalized at [facility] 12/23/20-12/25/20 due to  acute encephalopathy, worsening respiratory status, fatigue weakness and ambulatory dysfunction.  Update 06/01/20:  Patient states she is improving-HHRN is cleaning and dressing infection. Update 06/07/20:  Infection continues to improve per patient, HHRN continues to clean and dress infection site. Update 06/17/20:  Infection improved,  healed. Update 10/26/20:  Patient with recent fall injuring eye and ankle-patient states eye is healing and she is seeing provider today about ankle and knee. Update 11/26/20:  Patient without complaint today, getting Diabetic shoes 8/24. Update 12/27/20:  Patient stable today-she is scheduling follow up appointments. Update 01/26/21:  Patient complains of stomach ache and headache today-states she contacted her provider and is awaiting a return call.  Nurse Case Manager Clinical Goal(s):  Over the next 30 days, patient will attend all scheduled medical appointments:  Over the next 30 days, patient will work with CM team pharmacist to review medications. Update 05/20/20:  Patient has appointment with Pharmacist 05/21/20. Update 05/25/20:  Patient declines to speak with Pharmacist at this time. Update 08/30/20:  Patient is working with Pharmacist.  Interventions:  Inter-disciplinary care team collaboration (see longitudinal plan of care) Evaluation of current treatment plan and patient's adherence to plan as established by provider. Reviewed medications with patient. Collaborated with pharmacist regarding medications. Discussed plans with patient for ongoing care management follow up and provided patient with direct contact information for care management team Reviewed scheduled/upcoming provider appointments.  Update 05/20/20:  Patient unable to attend PT appointment 05/19/20, has next appointment 05/26/20.  Has Cardiology appointment 05/28/20.  Reviewed [place] information with patient again and with aide. Update 06/07/20:  Patient has PT appointment 06/09/20. Update 06/17/20:  patient attended PT appointment 06/16/20 and also attended appointment at [facility] this week-patient states she feels better. Pharmacy referral for medication review. Update 05/24/20: Pharmacist has attempted to speak with patient and has been unable to.  Patient at Urgent Care today with stomach pain.  RNCM  will reschedule appointment with patient and speak with her regarding importance of speaking with Pharmacist. Update 05/25/20: Patient declines to speak with Pharmacist at this time. Update 08/30/20:  Patient is working with Pharmacist for medication management. Update 09/30/20:  Patient without complaint today, has upcoming appointments with [facility] Pharmacist and SW.  Patient Goals/Self-Care Activities Over the next 30 days, patient will:  -Attends all scheduled provider appointments Calls pharmacy for medication refills Calls provider office for new concerns or questions  Follow Up Plan: The Managed Medicaid care management team will Owens out to the patient  again over the next 30 days.  The patient has been provided with contact information for the Managed Medicaid care management team and has been advised to call with any health related questions or concerns.    Follow Up:  Patient agrees to Care Plan and Follow-up.  Plan: The Managed Medicaid care management team will Owens out to the patient again over the next 30 days. and The  Patient has been provided with contact information for the Managed Medicaid care management team and has been advised to call with any health related questions or concerns.  Date/time of next scheduled RN care management/care coordination outreach: 02/23/21 at 0900.

## 2021-01-26 NOTE — Patient Instructions (Signed)
Visit Information  Ms. Rader was given information about Medicaid Managed Care team care coordination services as a part of their [facility] Medicaid benefit. [name] verbally consented to engagement with the [facility] Managed Care team.   If you are experiencing a medical emergency, please call 911 or report to your local emergency department or urgent care.   If you have a non-emergency medical problem during routine business hours, please contact your provider's office and ask to speak with a nurse.   For questions related to your [facility] health plan, please call: [phone] or go here:[email or web address]  If you would like to schedule transportation through your [facility] plan, please call the following number at least 2 days in advance of your appointment: [phone].  Call the [place] at [phone], at any time, 24 hours a day, 7 days a week. If you are in danger or need immediate medical attention call 911.  If you would like help to quit smoking, call 1-800-QUIT-NOW 573-071-5173) OR Espaol: 1-855-Djelo-Ya (3-149-702-6378) o para ms informacin haga clic aqu or Text READY to 200-400 to register via text  Ms. Busic - following are the goals we discussed in your visit today:   Goals Addressed             This Visit's Progress    Protect My Health       Timeframe:  Long-Range Goal Priority:  Medium Start Date:    04/23/20                         Expected End Date: ongoing           Follow Up Date: 02/26/21   - schedule appointment for vaccines needed due to my age or health - schedule recommended health tests (blood work, mammogram, colonoscopy, pap test) - schedule and keep appointment for annual check-up  Patient has PT appointment 05/19/20. Update 05/20/20:  Patient unable to attend PT appointment on 05/19/20.  Has next PT appointment 05/26/20.  Cardiology appointment 05/28/20. Update 05/24/20:  Attempted to reach  patient today-she is at Urgent Care with stomach pain.  Will reschedule. Update 05/25/20:  Patient seen at Urgent Care and [place] 05/24/20.  Patient with abscess- given antibiotics. Update 06/01/20:  Patient continues to improve, HHRN cleaning and changing dressing. Update 06/07/20:  Patient states abscess continues to improve.  Has PT on Wednesday, 06/09/20. Update 06/14/20:  Abscess healed.  Patient missed PT appointment as transportation did not pick her up-appointment rescheduled to 06/16/20. Update 06/17/20:  Patient attended PT appointment yesterday and also attended appointment at [place] this week-Patient states she feels better. Update 08/24/20:  Patient has appointment at [facility] 08/25/20 at 0930. Update 08/30/20:  Patient without complaints today, awaiting therapy services to start. Update 09/30/20:  Patient without complaint today, friend of family recently died.  Patient has upcoming appointment with Pharmacist and SW. Update 10/26/20:  Patient has appointment today for knee and ankle. Update 11/26/20:  Patient to get diabetic shoes 12/01/20. Update 12/27/20:  Patient hospitalized at [facility] 12/23/20-12/25/20.  Follow up appointment scheduled. Update 01/26/21:  Patient complaining of headache and stomach ache today-thinks she may have a virus-states she contacted her provider and is awaiting a return call.    The patient verbalized understanding of instructions provided today and declined a print copy of patient instruction materials.   The Managed Medicaid care management team will reach out to the patient again over the next  30 days.  The  Patient  has been provided with contact information for the Managed Medicaid care management team and has been advised to call with any health related questions or concerns.   [name] RN, BSN [place]  Triad Curator - Managed Medicaid High Risk [phone].    Following is a copy of your plan of  care:  Patient Care Plan: General Plan of Care (Adult)     Problem Identified: Health Promotion or Disease Self-Management (General Plan of Care)   Priority: Medium  Onset Date: 04/23/2020     Problem Identified: Barriers to Treatment   Priority: High  Onset Date: 04/23/2020     Long-Range Goal: Barriers to Treatment Identified and Managed   Start Date: 05/03/2020  Expected End Date: 02/26/2021  Recent Progress: On track  Priority: Medium  Note:   CARE PLAN ENTRY Medicaid Managed Care (see longitudinal plan of care for additional care plan information)  Current Barriers:  Transportation barriers-patient utilizing [facility] and [employer] as needed. Limited access to caregiver-patient needs PCS, currently not receiving services she is expecting. Patient choosing not to use aides provided to her-discussed finding another agency with patient-using [facility]. Update 01/26/21;  Has aide Monday-Friday from 10-12  Nurse Clinical Goal(s):  Over the next 30 days, patient will work with [employer] to address needs related to transportation. Update 05/12/20:  Patient has used [employer] and is very pleased. Over the next 90 days, patient will attend all scheduled medical appointments. RNCM will make referral to SW for assistance with PCS Update 05/20/20:  Patient states she is now receiving PCS from [facility] 3 days a week for 4 hours each day. Update 05/27/20: BSW spoke with patient about PCS, she stated someone usually comes in 3 days a week for a few hours. She stated her aide was off today. BSW contacted [place] and spoke with [facility], she stated that patient has 13 hours per week with 6 days a week, however patient chooses to have an aide come out 3 days a week so that she can have more hours. The aide assist with most ADL's. Update 06/04/20: Patient stated she is ready to move to [facility]. She has already started  the process but wants to save some money first and she has to wait on her nephew who will drive the uhaul truck. Update 06/24/20: BSW contacted patient to follow up on her moving to [facility], she stated she is still saving money. Patient did cut call short because she was sleeping, patient stated she was not in need of any resources at this time. Update 08/27/20: Bsw received information from [facility] about patient needing some batteries for her remote. BSW contacted patient to see if any resources were needed. Patient stated she contacted [employer] and they informed her it would cost 40 dollars to replace her remote, patient states she only needs batteries. BSW asked if patient had anyone to call that could bring her some batteries, patient stated no. She states her daughter is mad at her right now and working so she does not talk to her, patient states she has not family to call. BSW asked if her aide could help, patient states she has not had an aide in about a week, she had to let her last aide go because she smelled like marijuana and they have not replaced her. BSW contacted patients healthcare agency [employer] at [phone] and  left a voicemail. Update 09/30/20:  Patient has aides available to her at [facility] but chooses not to use them. Discussion with [facility] and they continue to look for another aide for Ms. Gibeault. Discussed with patient about her finding a new agency. 10/05/20: BSW received a telephone call from [facility] with patient on the line. Patient stated that she went to the ED and her eye was still hurting from a fall. Patient stated she has the information for her eye doctor and was getting ready to contact them. Patient stated she has not recieieved an aide since last Thursday.  Update 10/26/20:  Patient now has aide coming M-F 10-12, "Lucy" Update 11/26/20:  Patient with no aide right now-has chosen not to utilize aides offered to her. Update 12/27/20:  Patient  currently using [facility]?  Interventions: Inter-disciplinary care team collaboration (see longitudinal plan of care) Referred patient to community resources care guide team for assistance with transportation. Provided patient with information about [facility] and [employer]. Discussed plans with patient for ongoing care management follow up and provided patient with direct contact information for care management team Advised patient to call [place] or [employer]. Collaborated with [employer] and [place] regarding transportation. Assisted patient/caregiver with obtaining information about health plan benefits Collaborated with Social Work regarding PCS. Update 08/30/20:  Patient continues to have issues with PCS-SW is investigating. Update 06/14/20:  Patient continues to utilize [employer], aide, [employer] as needed-has missed appointments due to transportation "no shows"-will continue to follow. Update 08/30/20:  patient has been successful in attending appointments with [facility] and [facility] transportation. Update 09/30/20:  Patient has upcoming appointments with [facility] Pharmacist and SW. Patient Self Care Activities:  Patient will call [place]. Update 06/07/20:  Patient continues to utilize [facility] with success. Update 06/14/20:  Patient has missed appointments due to transportation no shows.  [facility] transportation has been utilized as needed. Update 09/30/20:  Patient with no transportation problems right now-uses [employer] or [place] as needed for appointments with success.    Patient Care Plan: General Plan of Care (Adult)     Problem Identified: Health Promotion or Disease Self-Management (General Plan of Care)   Priority: High  Onset Date: 04/23/2020     Long-Range Goal: Self-Management Plan Developed   Start Date: 04/23/2020  Expected End Date: 02/26/2021  Recent Progress:  On track  Priority: Medium  Note:   Current Barriers:  Chronic Disease Management support and education needs.  Patient with abdominal wall infection-seen at [facility] ED 05/24/20 - I&D performed.  Recently hospitalized at [facility] 12/23/20-12/25/20 due to  acute encephalopathy, worsening respiratory status, fatigue weakness and ambulatory dysfunction.  Update 06/01/20:  Patient states she is improving-HHRN is cleaning and dressing infection. Update 06/07/20:  Infection continues to improve per patient, HHRN continues to clean and dress infection site. Update 06/17/20:  Infection improved, healed. Update 10/26/20:  Patient with recent fall injuring eye and ankle-patient states eye is healing and she is seeing provider today about ankle and knee. Update 11/26/20:  Patient without complaint today, getting Diabetic shoes 8/24. Update 12/27/20:  Patient stable today-she is scheduling follow up appointments. Update 01/26/21:  Patient complains of stomach ache and headache today-states she contacted her provider and is awaiting a return call.  Nurse Case Manager Clinical Goal(s):  Over the next 30 days, patient will attend all scheduled medical appointments:  Over the next 30 days, patient will work with CM team  pharmacist to review medications. Update 05/20/20:  Patient has appointment with Pharmacist 05/21/20. Update 05/25/20:  Patient declines to speak with Pharmacist at this time. Update 08/30/20:  Patient is working with Pharmacist.  Interventions:  Inter-disciplinary care team collaboration (see longitudinal plan of care) Evaluation of current treatment plan and patient's adherence to plan as established by provider. Reviewed medications with patient. Collaborated with pharmacist regarding medications. Discussed plans with patient for ongoing care management follow up and provided patient with direct contact information for care management team Reviewed scheduled/upcoming provider appointments.  Update  05/20/20:  Patient unable to attend PT appointment 05/19/20, has next appointment 05/26/20.  Has Cardiology appointment 05/28/20.  Reviewed [place] information with patient again and with aide. Update 06/07/20:  Patient has PT appointment 06/09/20. Update 06/17/20:  patient attended PT appointment 06/16/20 and also attended appointment at [facility] this week-patient states she feels better. Pharmacy referral for medication review. Update 05/24/20: Pharmacist has attempted to speak with patient and has been unable to.  Patient at Urgent Care today with stomach pain.  RNCM will reschedule appointment with patient and speak with her regarding importance of speaking with Pharmacist. Update 05/25/20: Patient declines to speak with Pharmacist at this time. Update 08/30/20:  Patient is working with Pharmacist for medication management. Update 09/30/20:  Patient without complaint today, has upcoming appointments with [facility] Pharmacist and SW.  Patient Goals/Self-Care Activities Over the next 30 days, patient will:  -Attends all scheduled provider appointments Calls pharmacy for medication refills Calls provider office for new concerns or questions  Follow Up Plan: The Managed Medicaid care management team will reach out to the patient again over the next 30 days.  The patient has been provided with contact information for the Managed Medicaid care management team and has been advised to call with any health related questions or concerns.

## 2021-01-27 ENCOUNTER — Other Ambulatory Visit: Payer: Self-pay | Admitting: Obstetrics and Gynecology

## 2021-01-27 ENCOUNTER — Other Ambulatory Visit: Payer: Self-pay

## 2021-01-27 DIAGNOSIS — I110 Hypertensive heart disease with heart failure: Secondary | ICD-10-CM | POA: Diagnosis not present

## 2021-01-27 NOTE — Patient Outreach (Signed)
Care Coordination  01/27/2021  Rylan Kaufmann 03-24-1961 [id number]  RNCM returned patient's phone call-patient was in grocery store and said she would call back.  [name] RN, BSN [place]  Triad Curator - Managed Medicaid High Risk [phone].

## 2021-01-28 DIAGNOSIS — I110 Hypertensive heart disease with heart failure: Secondary | ICD-10-CM | POA: Diagnosis not present

## 2021-01-31 DIAGNOSIS — I110 Hypertensive heart disease with heart failure: Secondary | ICD-10-CM | POA: Diagnosis not present

## 2021-02-01 ENCOUNTER — Other Ambulatory Visit: Payer: Self-pay

## 2021-02-01 ENCOUNTER — Other Ambulatory Visit: Payer: Self-pay | Admitting: Obstetrics and Gynecology

## 2021-02-01 DIAGNOSIS — I110 Hypertensive heart disease with heart failure: Secondary | ICD-10-CM | POA: Diagnosis not present

## 2021-02-01 NOTE — Patient Outreach (Signed)
Care Coordination  02/01/2021  Michele Owens Sep 15, 1960 [id number]  RNCM returned patient's phone call.  Patient called to say she is changing home care services to [place].  [name] RN, BSN   Triad Curator - Managed Medicaid High Risk [phone].

## 2021-02-02 DIAGNOSIS — I110 Hypertensive heart disease with heart failure: Secondary | ICD-10-CM | POA: Diagnosis not present

## 2021-02-03 DIAGNOSIS — I110 Hypertensive heart disease with heart failure: Secondary | ICD-10-CM | POA: Diagnosis not present

## 2021-02-04 DIAGNOSIS — E876 Hypokalemia: Secondary | ICD-10-CM | POA: Diagnosis not present

## 2021-02-04 DIAGNOSIS — Z7689 Persons encountering health services in other specified circumstances: Secondary | ICD-10-CM | POA: Diagnosis not present

## 2021-02-04 DIAGNOSIS — E119 Type 2 diabetes mellitus without complications: Secondary | ICD-10-CM | POA: Diagnosis not present

## 2021-02-04 DIAGNOSIS — E039 Hypothyroidism, unspecified: Secondary | ICD-10-CM | POA: Diagnosis not present

## 2021-02-04 DIAGNOSIS — I110 Hypertensive heart disease with heart failure: Secondary | ICD-10-CM | POA: Diagnosis not present

## 2021-02-04 DIAGNOSIS — E785 Hyperlipidemia, unspecified: Secondary | ICD-10-CM | POA: Diagnosis not present

## 2021-02-07 DIAGNOSIS — I110 Hypertensive heart disease with heart failure: Secondary | ICD-10-CM | POA: Diagnosis not present

## 2021-02-11 DIAGNOSIS — Z7689 Persons encountering health services in other specified circumstances: Secondary | ICD-10-CM | POA: Diagnosis not present

## 2021-02-11 DIAGNOSIS — M13862 Other specified arthritis, left knee: Secondary | ICD-10-CM | POA: Diagnosis not present

## 2021-02-11 DIAGNOSIS — M13861 Other specified arthritis, right knee: Secondary | ICD-10-CM | POA: Diagnosis not present

## 2021-02-12 IMAGING — DX DG CHEST 1V PORT
1 series · 1 of 1 positions shown · non-contrast
Comparison: Radiographs 04/23/2018. additional prior radiographs
reviewed.

CLINICAL DATA: Cough and chest pain. Shortness of breath.

EXAM:
PORTABLE CHEST 1 VIEW

[chest ap]
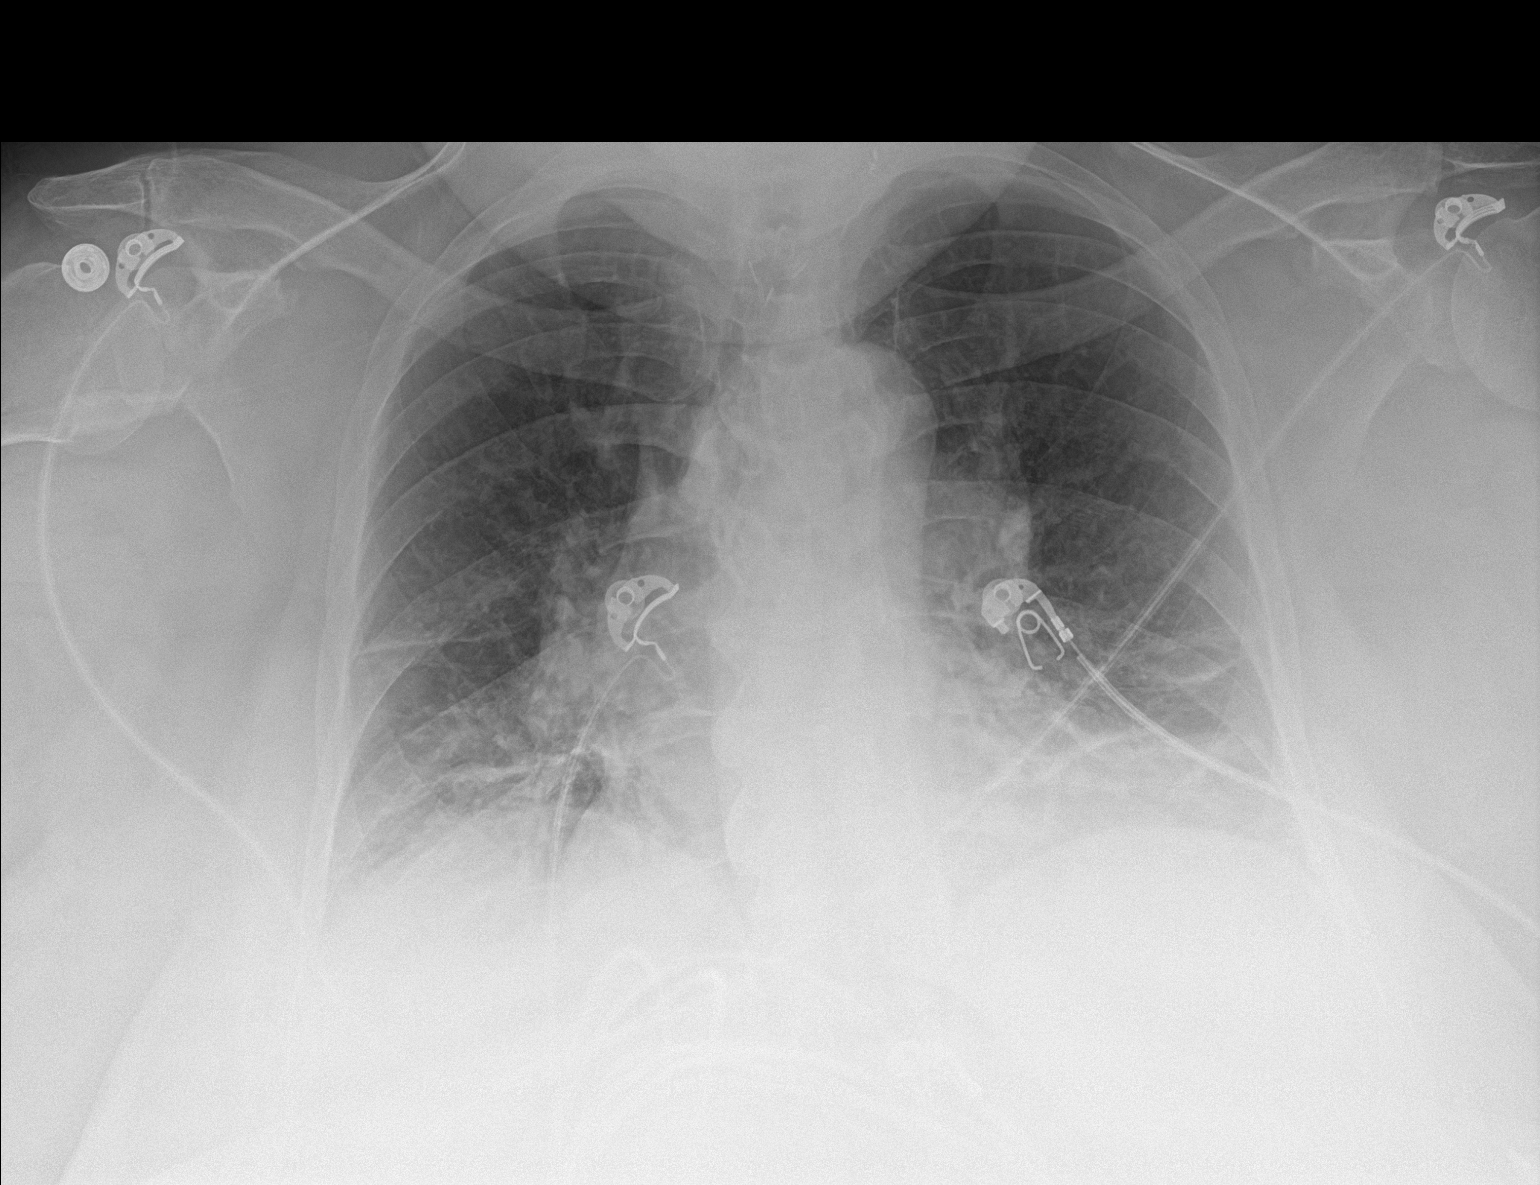

[1 of 1 positions shown; findings below may reference images not displayed]

FINDINGS: Unchanged cardiomegaly mediastinal contours. Improved pulmonary
edema from prior exam. Streaky bibasilar scarring again seen. No
acute consolidative opacity. No large pleural effusion. No
pneumothorax. Detailed evaluation limited by soft tissue attenuation
from habitus and portable technique. Surgical clips at the thoracic
inlet.
IMPRESSION: Stable cardiomegaly and bibasilar scarring. Improved pulmonary edema
from exam 6 weeks ago.

## 2021-02-12 IMAGING — CT CT ANGIO CHEST
3 of 7 series · 19 of 36 positions shown · IV contrast (omnipaque)
Comparison: Chest x-ray 06/09/2018, CT chest 09/23/2016

CLINICAL DATA: Left-sided chest pain

EXAM:
CT ANGIOGRAPHY CHEST WITH CONTRAST
TECHNIQUE: Multidetector CT imaging of the chest was performed using the
standard protocol during bolus administration of intravenous
contrast. Multiplanar CT image reconstructions and MIPs were
obtained to evaluate the vascular anatomy.
CONTRAST:  100mL OMNIPAQUE IOHEXOL 350 MG/ML SOLN

[Series 7: pe lung · axial · 0.98mm/px · z∈[+1220,+1336]mm · 3 of 118 slices shown]
[im 30/118  mediastinal]
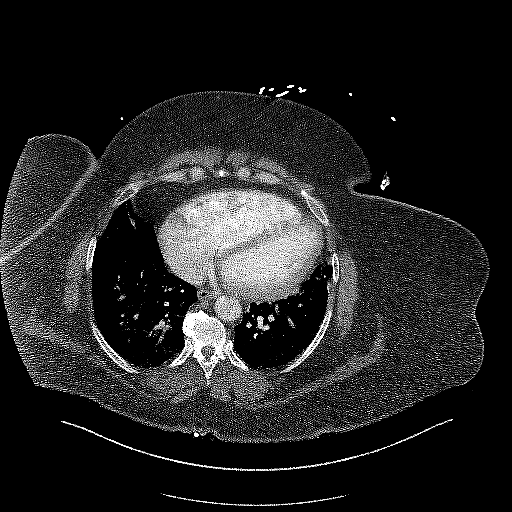
[im 59/118  mediastinal]
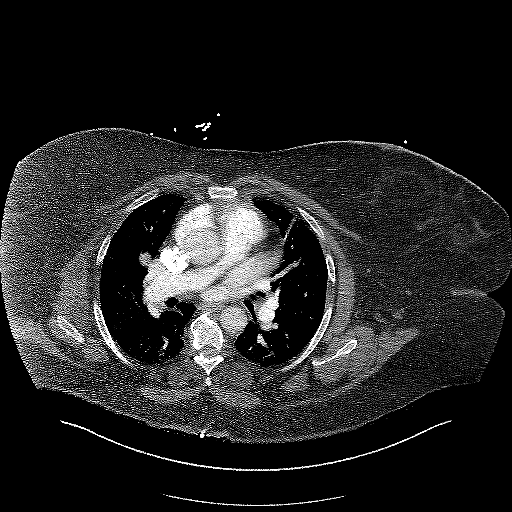
[im 88/118  mediastinal]
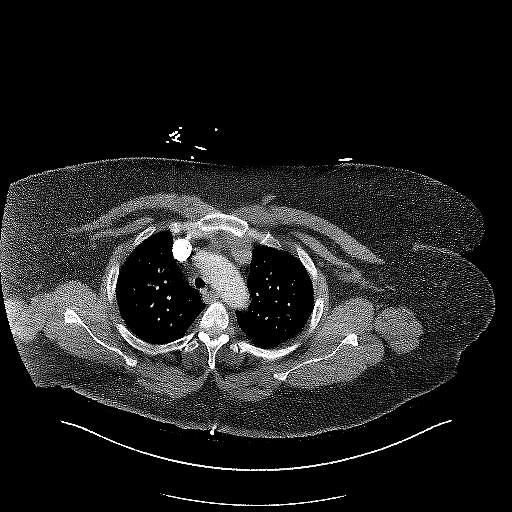

[Series 8: pe thins · axial · 0.98mm/px · z∈[+1138,+1379]mm · 15 of 395 slices shown]
[im 25/395  lung]
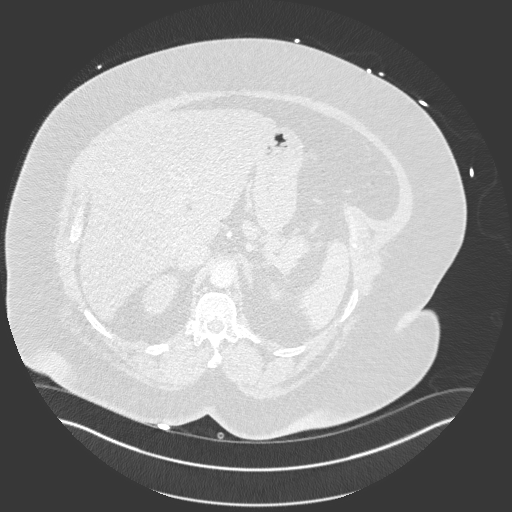
[im 50/395  mediastinal]
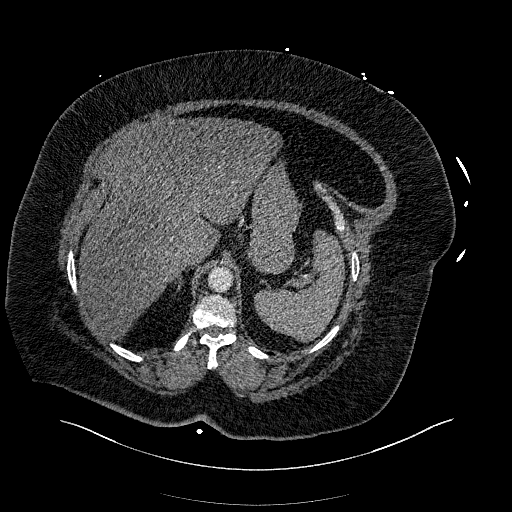
[im 74/395  lung]
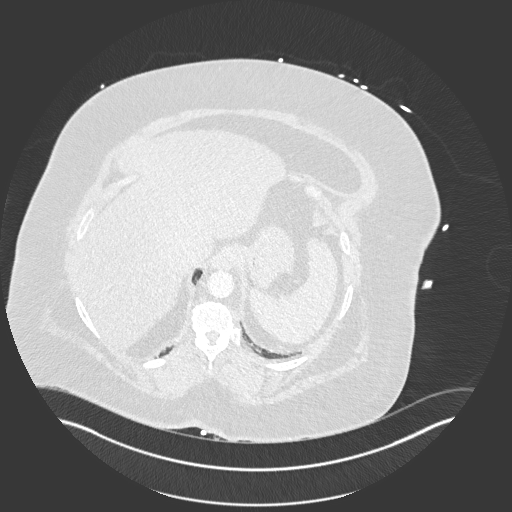
[im 99/395  mediastinal]
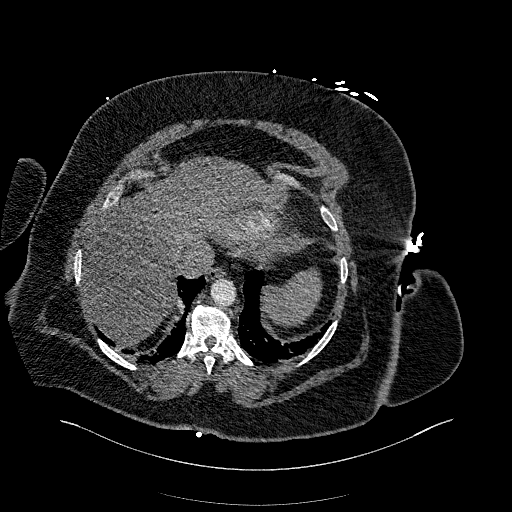
[im 124/395  lung]
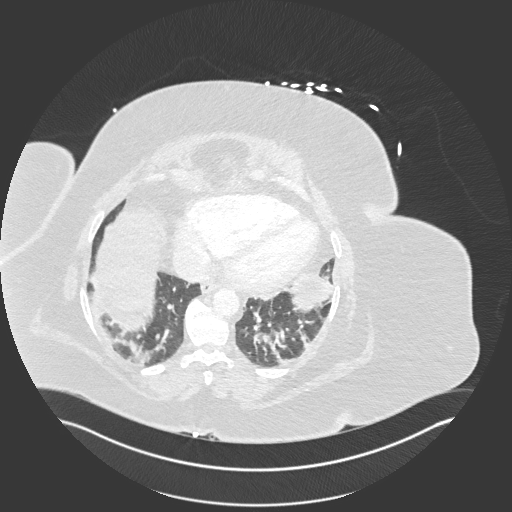
[im 148/395  mediastinal]
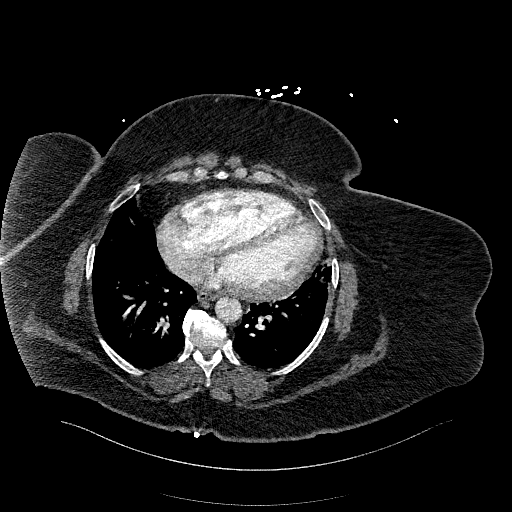
[im 173/395  lung]
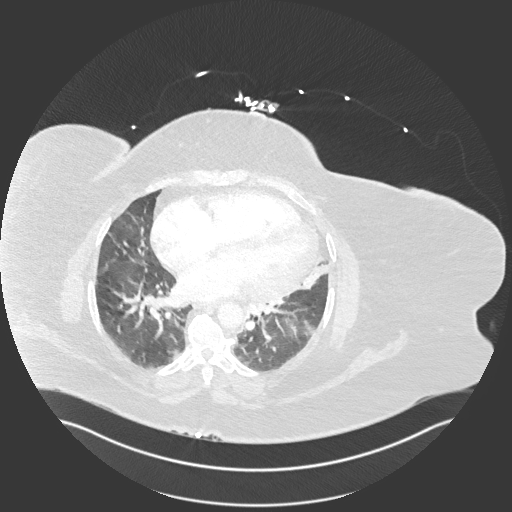
[im 198/395  mediastinal]
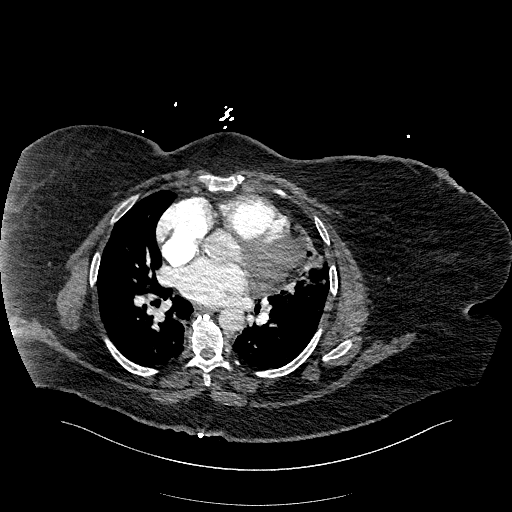
[im 222/395  lung]
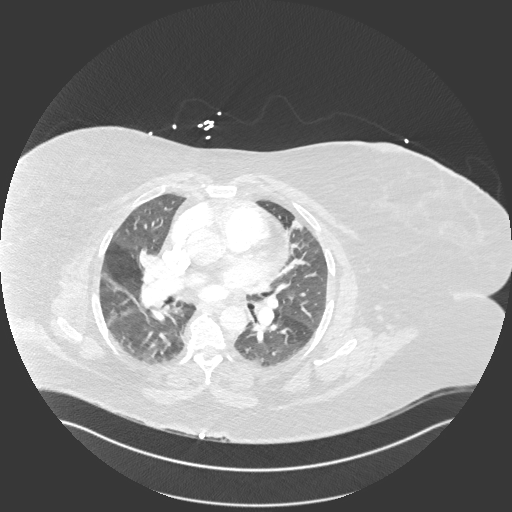
[im 247/395  mediastinal]
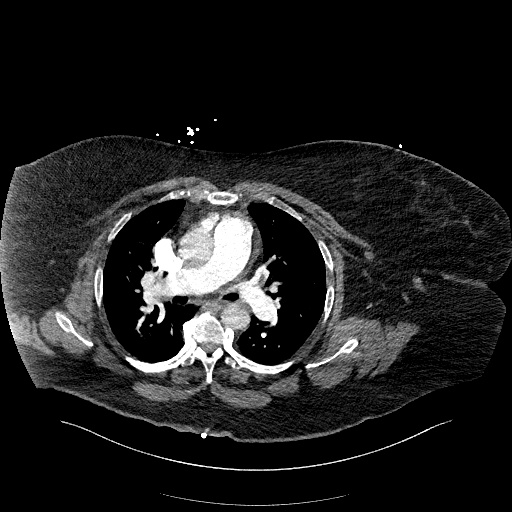
[im 271/395  lung]
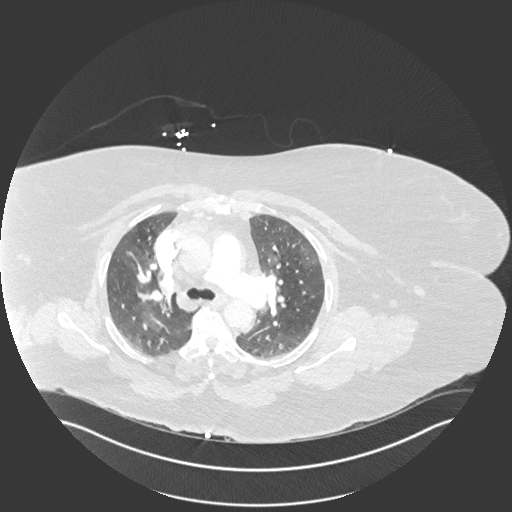
[im 296/395  mediastinal]
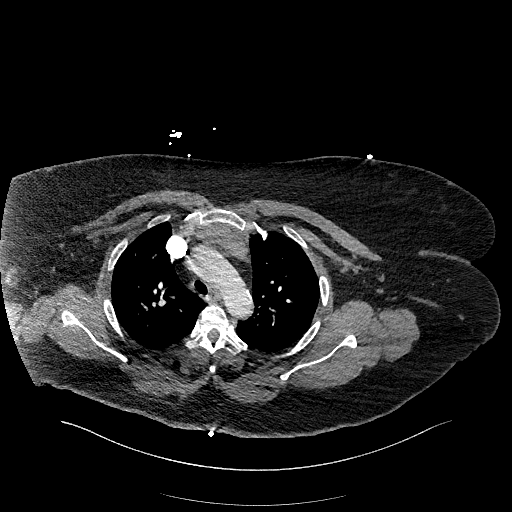
[im 321/395  lung]
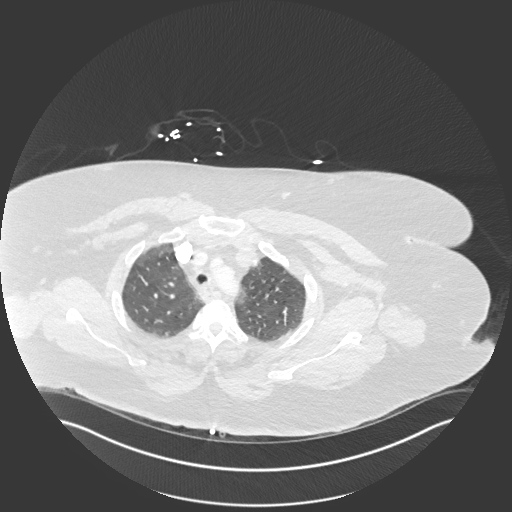
[im 345/395  mediastinal]
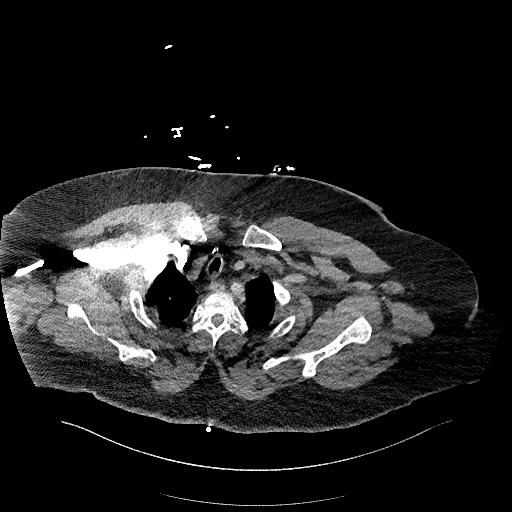
[im 370/395  lung]
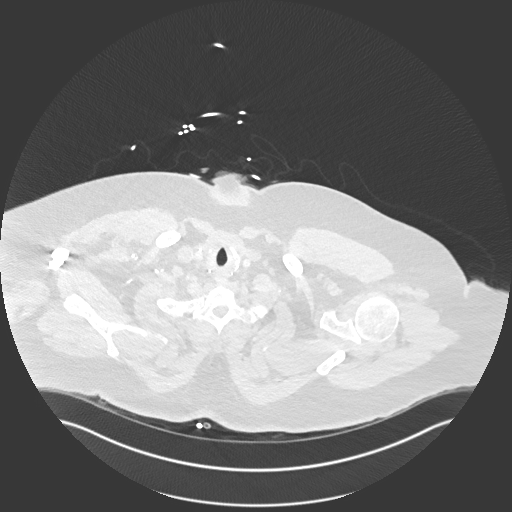

[Series 9: pe 2mm cor · coronal · 0.59mm/px · 1 of 151 slices shown]
[im 76/151  mediastinal]
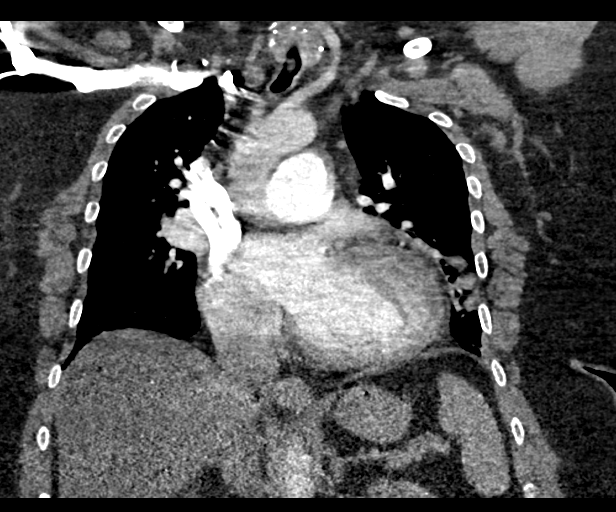

[19 of 36 positions shown; findings below may reference images not displayed]

FINDINGS: Cardiovascular: Satisfactory opacification of the pulmonary arteries
to the segmental level. No evidence of pulmonary embolism. Poor
contrast opacification of subsegmental lower lobe branches limits
evaluation for PE in these regions. Mild cardiomegaly. No
pericardial effusion. Nonaneurysmal aorta. Mild aortic
atherosclerosis.

Mediastinum/Nodes: Midline trachea. No thyroid mass. No significant
adenopathy. Esophagus within normal limits.

Lungs/Pleura: Mild bilateral patchy foci of ground-glass density. No
pleural effusion. Partial atelectasis at the lingula and lung bases.

Upper Abdomen: No acute abnormality.

Musculoskeletal: No chest wall abnormality. No acute or significant
osseous findings.

Review of the MIP images confirms the above findings.
IMPRESSION: 1. Suboptimal opacification of distal branch vessels limits
evaluation for subsegmental emboli. No acute central embolus is
seen.
2. Mild cardiomegaly
3. Mild bilateral ground-glass densities within the lung fields
which could be secondary to mild pulmonary edema or possible
respiratory infection. There is partial atelectasis at the lingula
and bilateral lower lobes.

Aortic Atherosclerosis (Q9QI8-BIQ.Q).

## 2021-02-14 ENCOUNTER — Other Ambulatory Visit: Payer: Self-pay

## 2021-02-14 ENCOUNTER — Other Ambulatory Visit: Payer: Medicaid Other | Admitting: Obstetrics and Gynecology
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
## 2021-02-15 ENCOUNTER — Ambulatory Visit: Payer: Self-pay

## 2021-02-15 ENCOUNTER — Telehealth: Payer: Self-pay | Admitting: Pharmacist

## 2021-02-15 NOTE — Patient Outreach (Signed)
02/15/2021 Name: Michele Owens MRN: [id number] DOB: 10/14/1960  Referred by: [name], MD Reason for referral : No chief complaint on file.   I was able to connect with patient via telephone this afternoon but patient states her mother was just in [place] and patient was preoccupied. She states she was not home and unable to review her medications. Patient requested we reschedule appointment.  Follow Up Plan: Appointment rescheduled for soonest available on December 6th at 3:00 PM  Hughes Better PharmD, CPP [place] [phone]

## 2021-02-19 DIAGNOSIS — J449 Chronic obstructive pulmonary disease, unspecified: Secondary | ICD-10-CM | POA: Diagnosis not present

## 2021-02-20 IMAGING — DX PORTABLE CHEST - 1 VIEW
1 series · 1 of 1 positions shown · non-contrast
Comparison: 06/09/2018, 04/23/2018, CT 06/09/2018

CLINICAL DATA: Dyspnea

EXAM:
PORTABLE CHEST 1 VIEW

[chest]
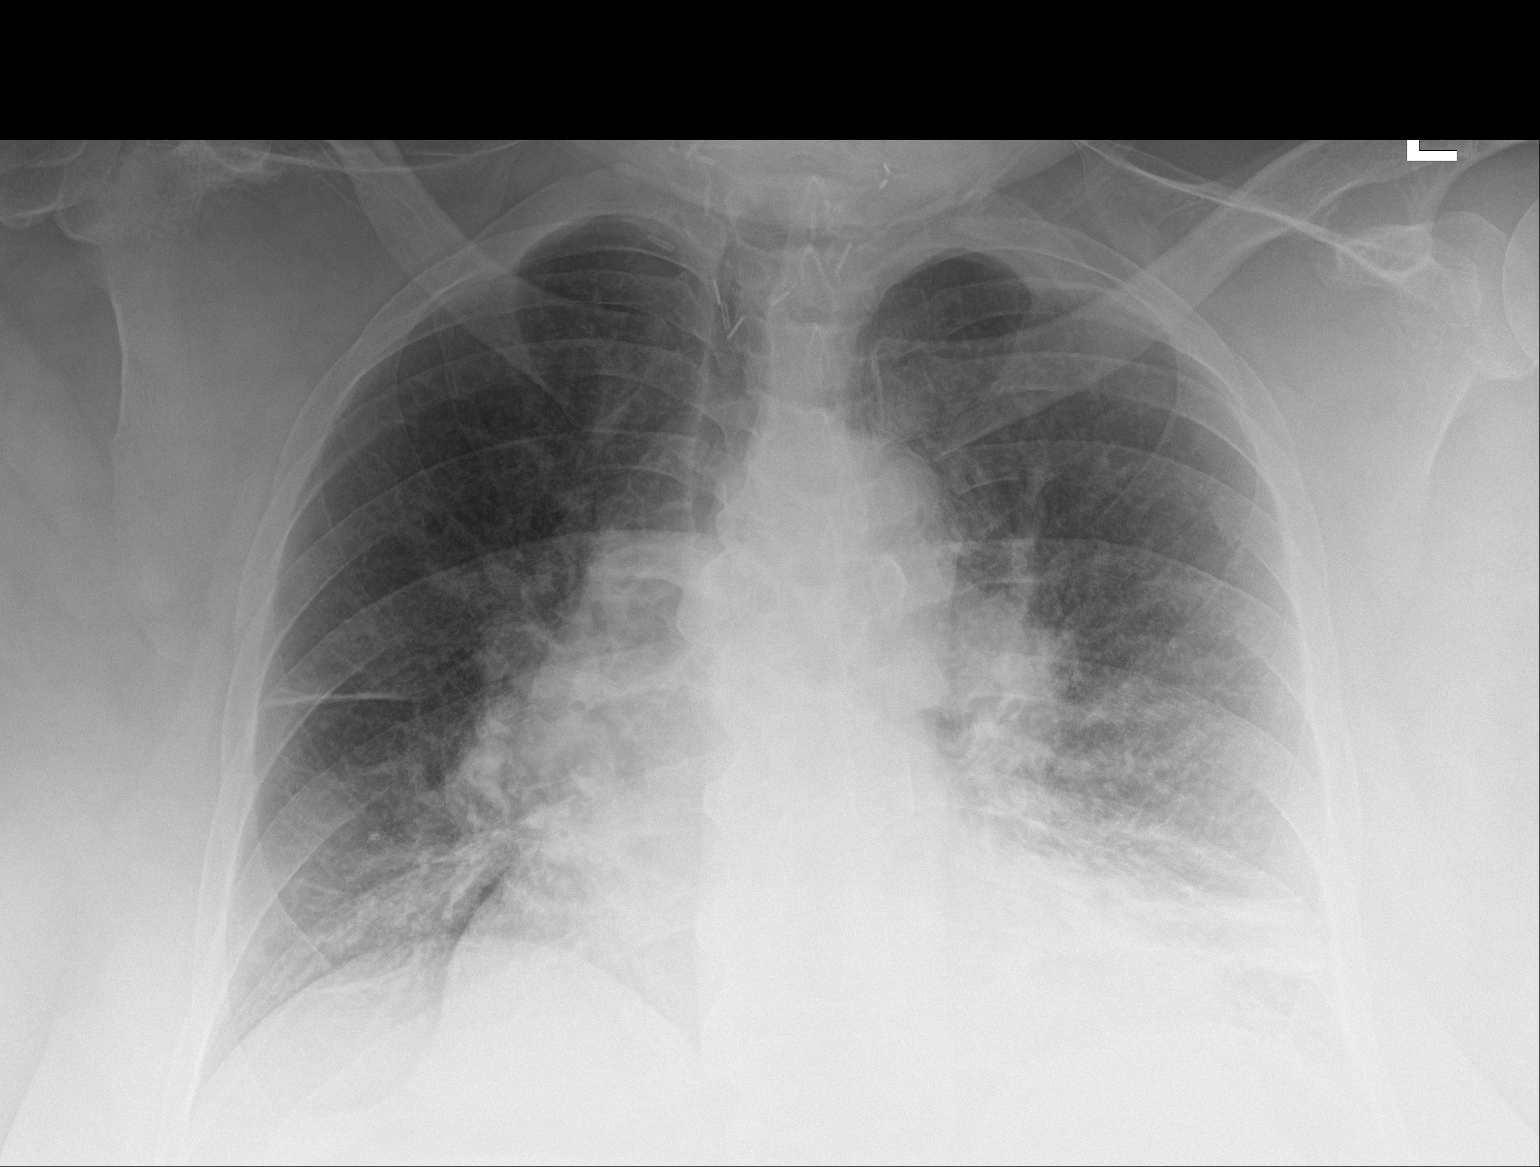

[1 of 1 positions shown; findings below may reference images not displayed]

FINDINGS: Cardiomegaly with enlarged pulmonary arteries and vascular
congestion. Similar appearance of streaky bibasilar scarring. Slight
increased hazy opacity at the bases. No pneumothorax. Postsurgical
changes at the thoracic inlet. No pneumothorax.
IMPRESSION: 1. Cardiomegaly with vascular congestion. Enlarged appearing central
pulmonary vessels, possible hypertension
2. Chronic linear atelectasis or scarring at the bases. Slight
increased hazy opacity may reflect superimposed edema or mild
infiltrates.

## 2021-02-20 IMAGING — CR LUMBAR SPINE - COMPLETE 4+ VIEW
5 series · 5 of 5 positions shown · non-contrast
Comparison: None.

CLINICAL DATA: 57 y/o F; fall today with upper and lower back pain.

EXAM:
THORACIC SPINE 2 VIEWS; LUMBAR SPINE - COMPLETE 4+ VIEW

[l-spine ap]
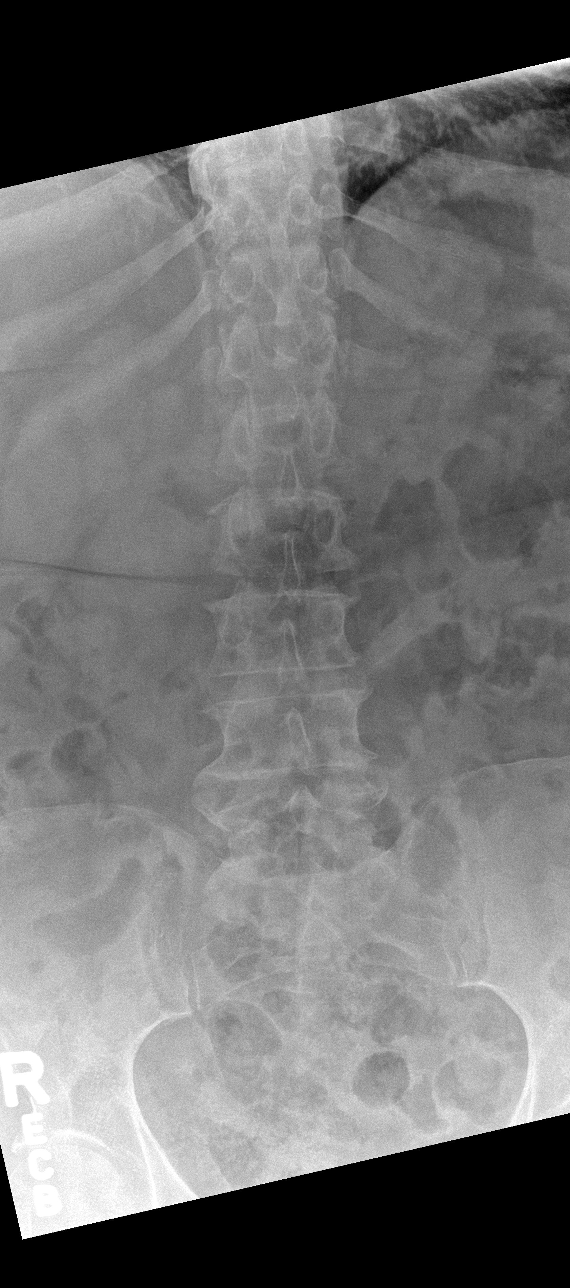

[l-spine obl (1 of 2)]
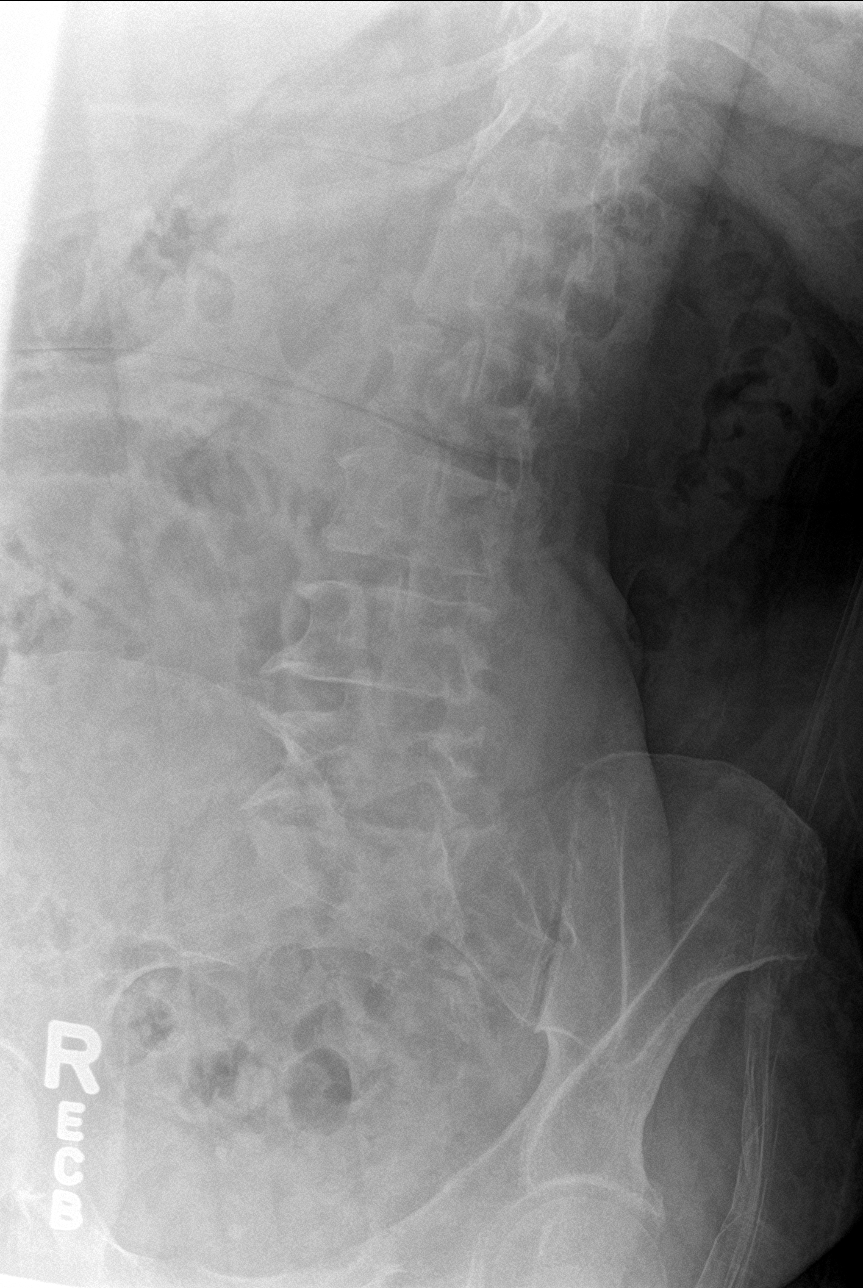

[l-spine lat]
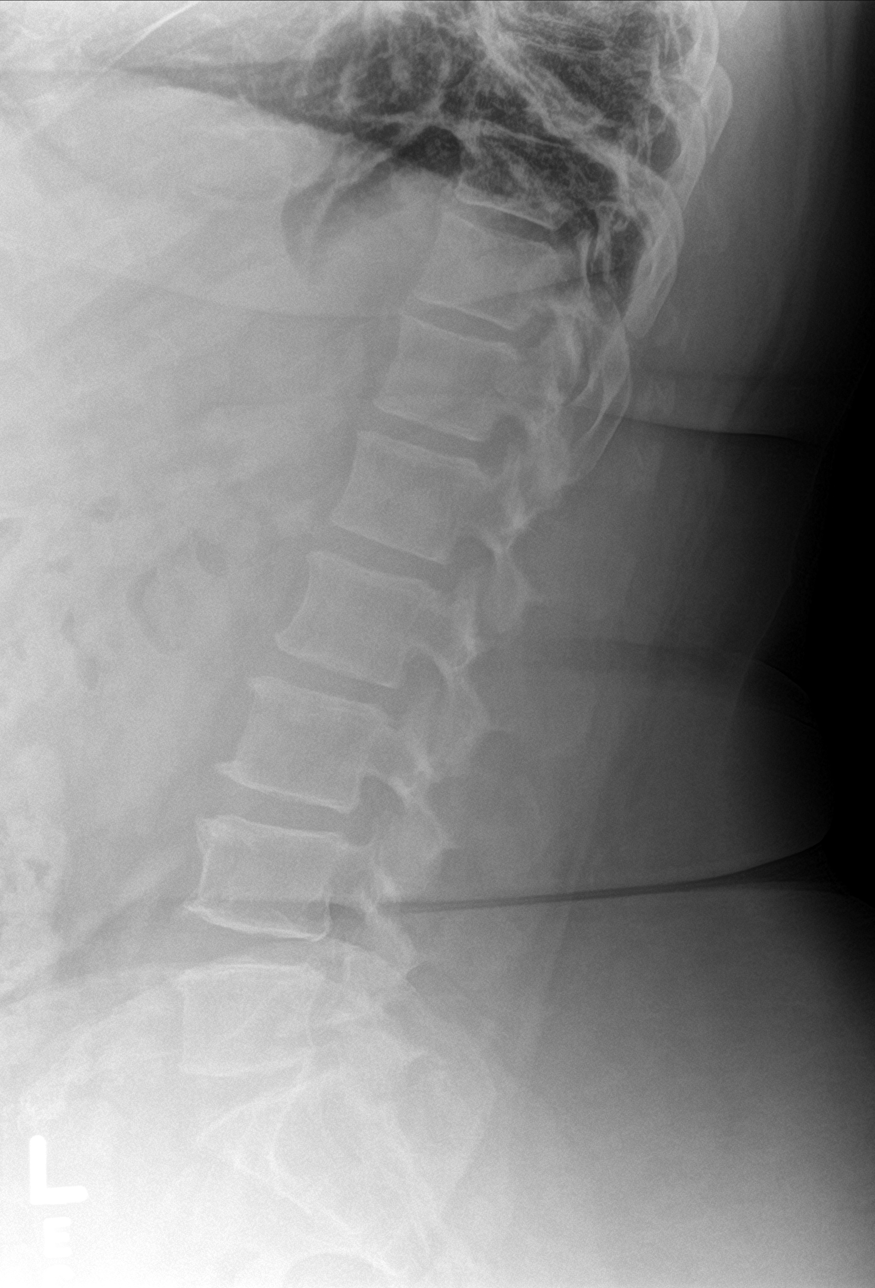

[l-spine spot]
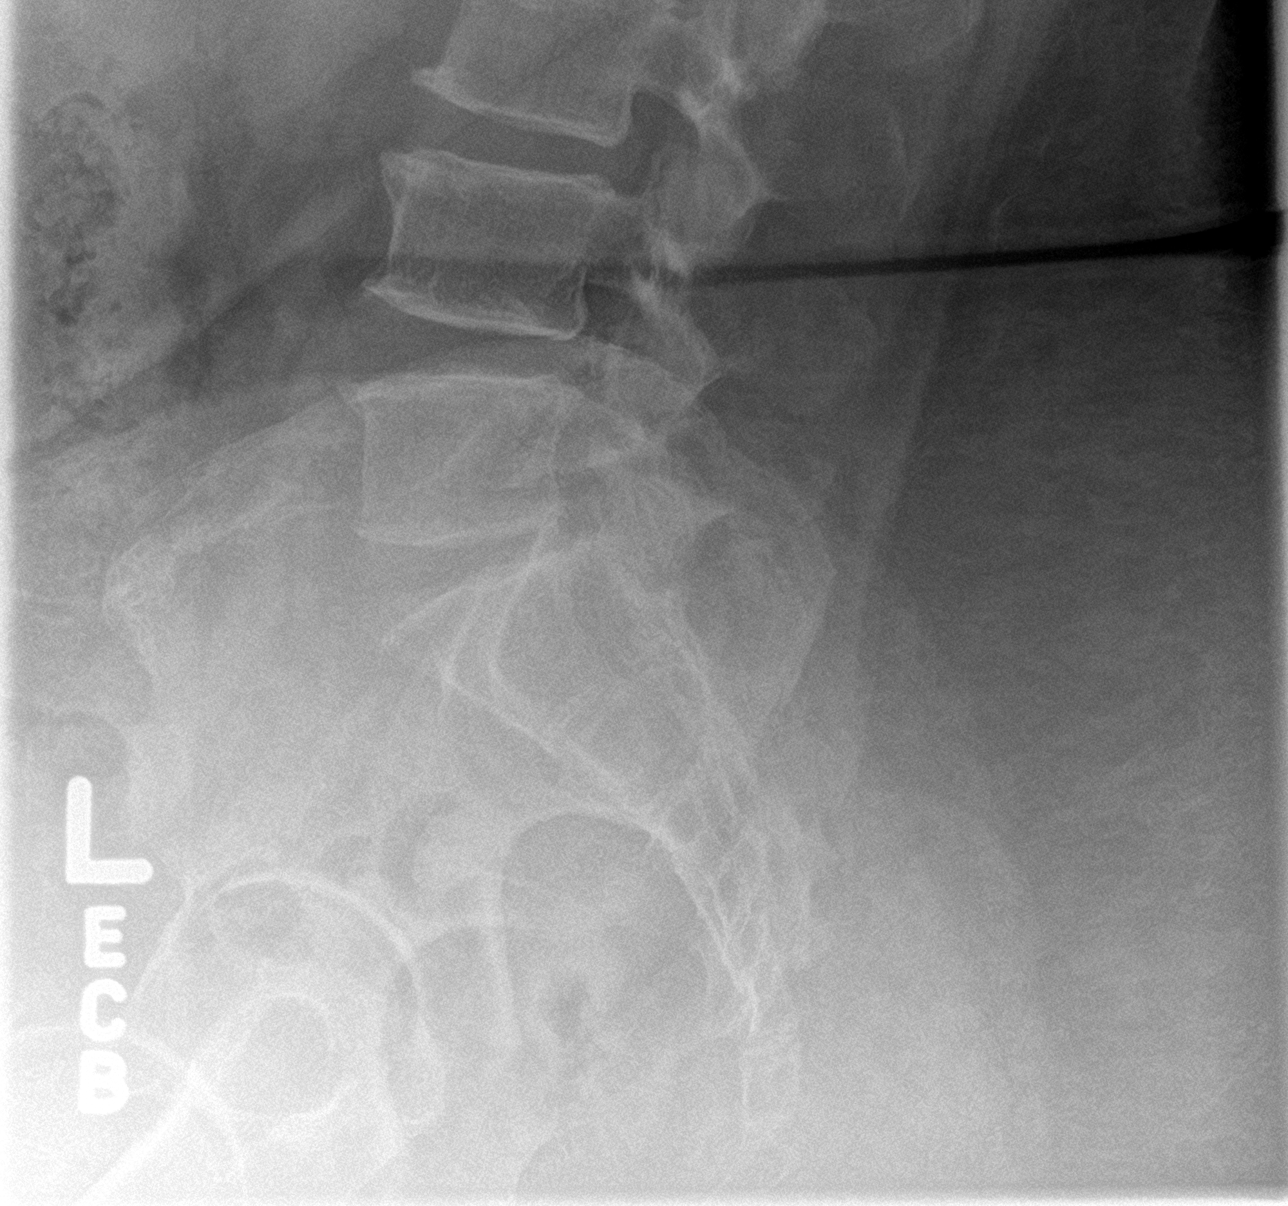

[l-spine obl (2 of 2)]
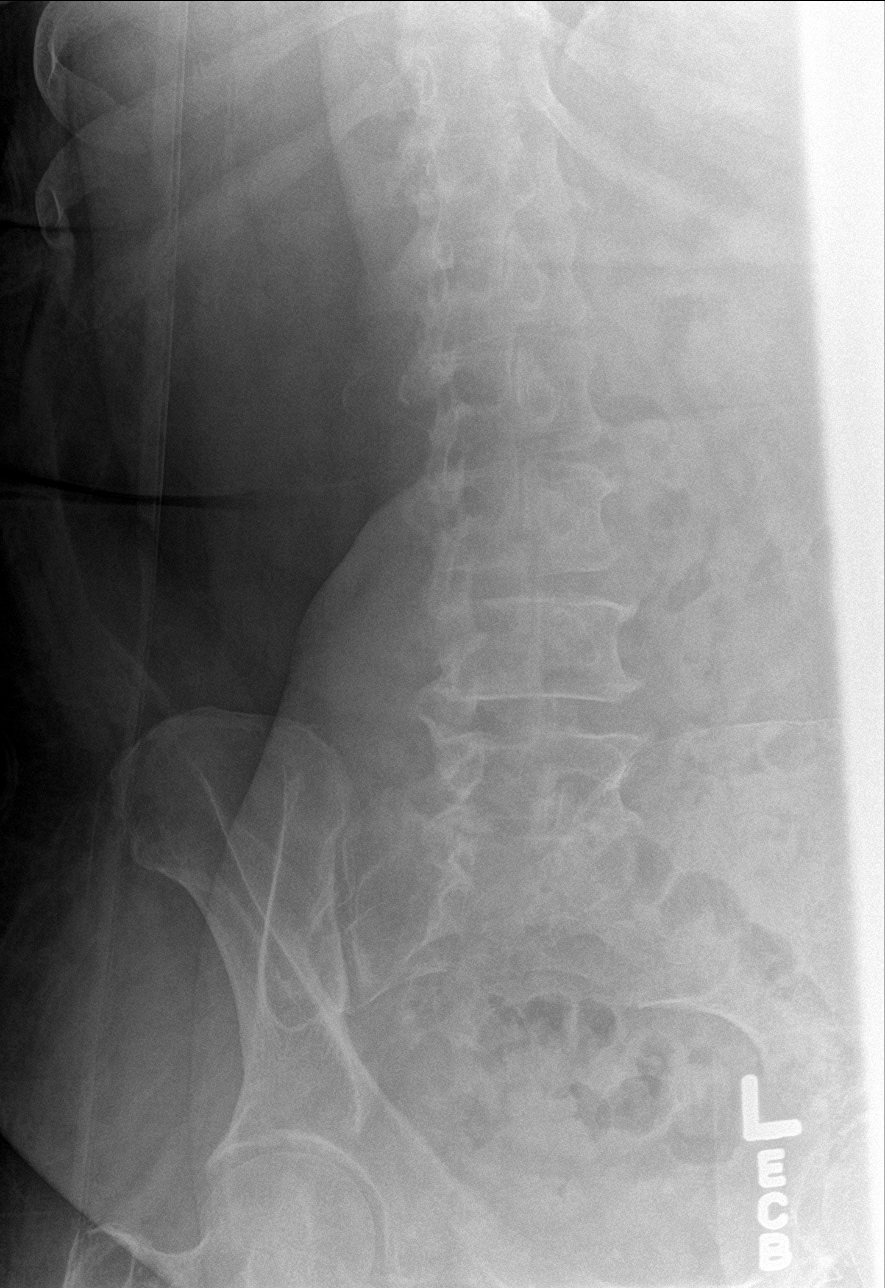

[5 of 5 positions shown; findings below may reference images not displayed]

FINDINGS: Thoracic spine:

Twelve paired thoracic ribs. Surgical clips project over the lower
neck. Multilevel bridging endplate osteophytes.

Lumbar spine:

Five lumbar vertebral bodies. No pars defect. Normal lumbar lordosis
without listhesis. No loss of vertebral body or disc space height.
Small multilevel endplate marginal osteophytes.
IMPRESSION: 1. No acute fracture or malalignment.
2. Multilevel bridging endplate osteophytes of thoracic spine and
mild discogenic degenerative changes of lumbar spine.

## 2021-02-20 IMAGING — CR THORACIC SPINE 2 VIEWS
3 series · 3 of 3 positions shown · non-contrast
Comparison: None.

CLINICAL DATA: 57 y/o F; fall today with upper and lower back pain.

EXAM:
THORACIC SPINE 2 VIEWS; LUMBAR SPINE - COMPLETE 4+ VIEW

[t-spine ap]
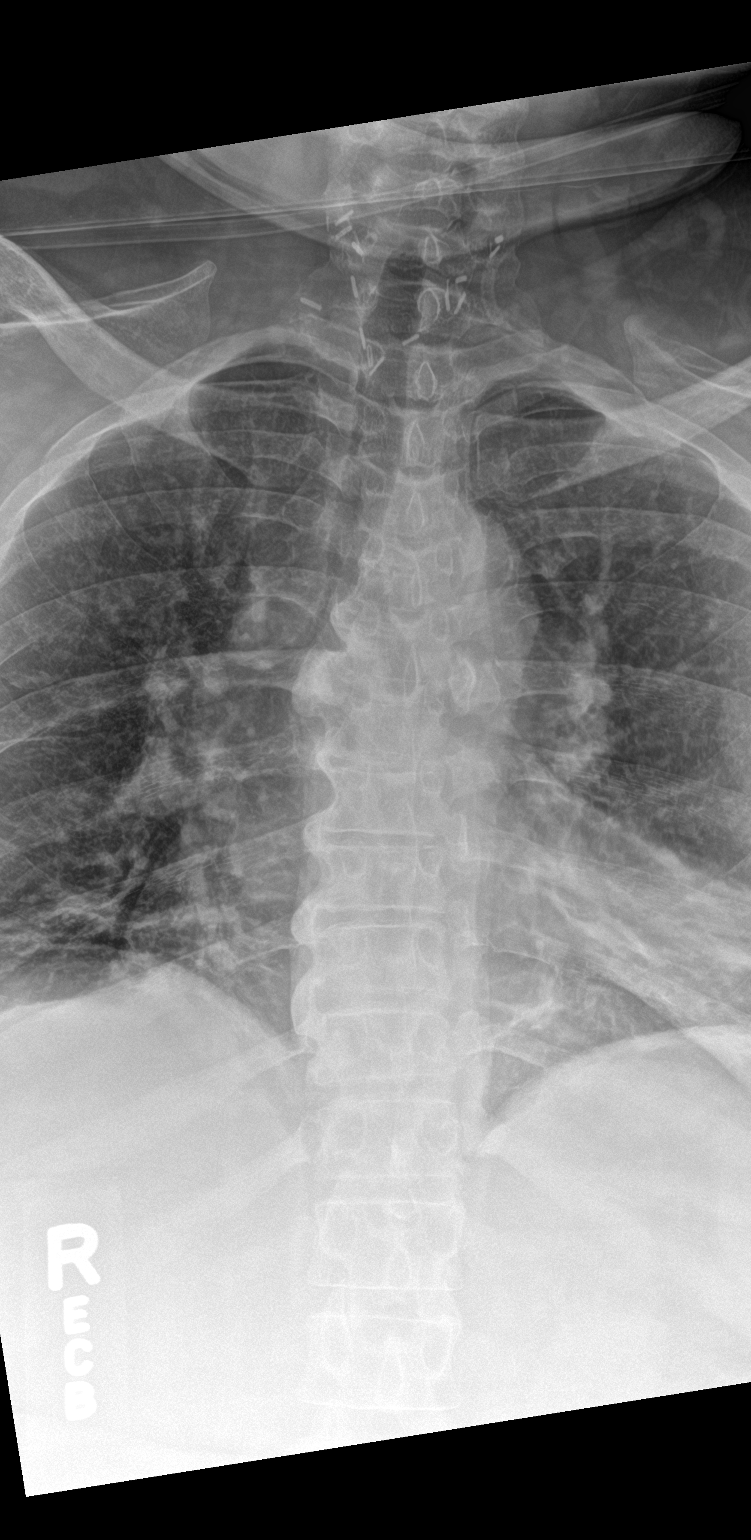

[t-spine lat]
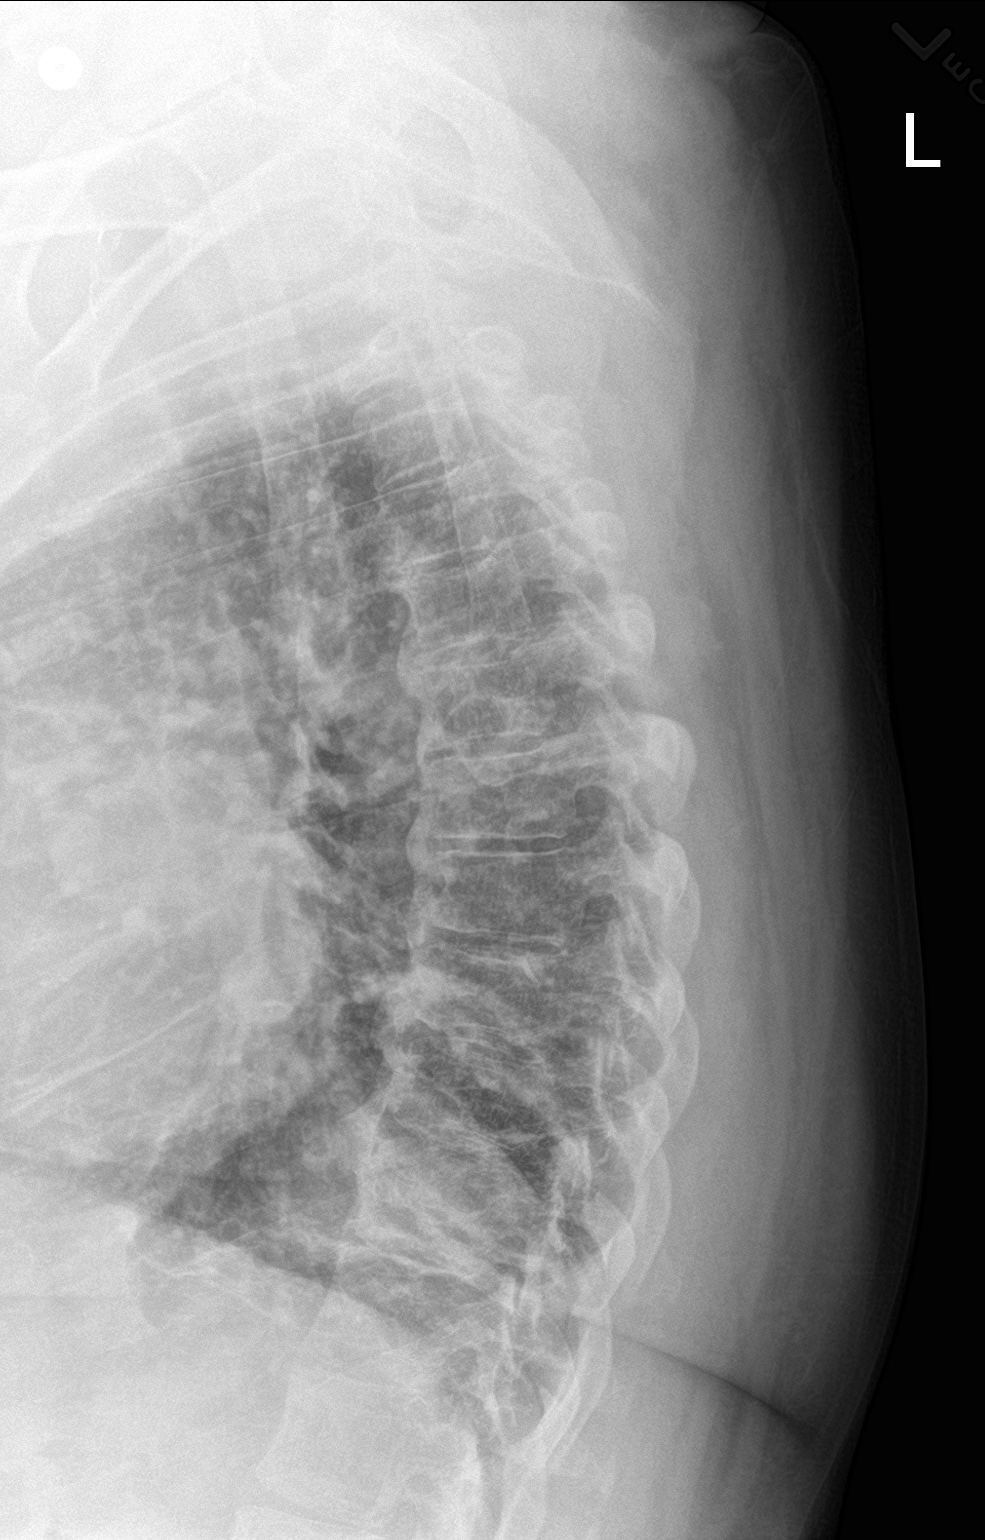

[t-spine swimmers]
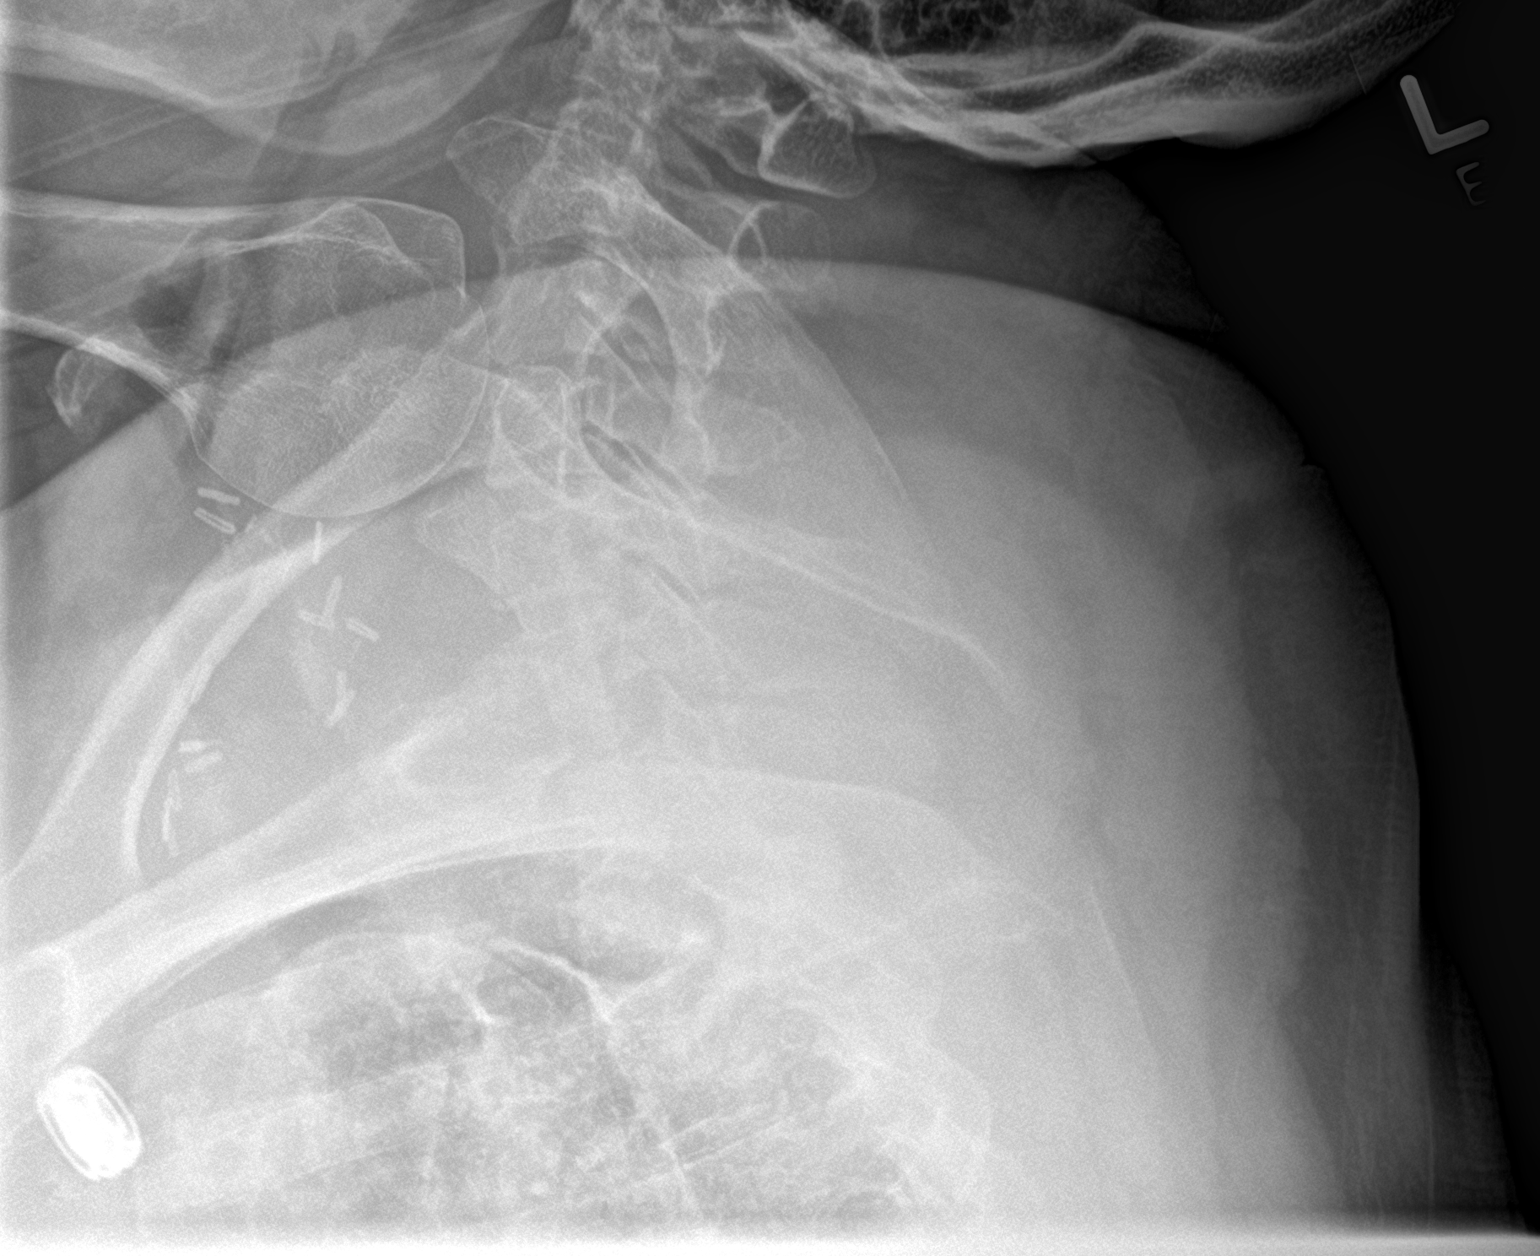

[3 of 3 positions shown; findings below may reference images not displayed]

FINDINGS: Thoracic spine:

Twelve paired thoracic ribs. Surgical clips project over the lower
neck. Multilevel bridging endplate osteophytes.

Lumbar spine:

Five lumbar vertebral bodies. No pars defect. Normal lumbar lordosis
without listhesis. No loss of vertebral body or disc space height.
Small multilevel endplate marginal osteophytes.
IMPRESSION: 1. No acute fracture or malalignment.
2. Multilevel bridging endplate osteophytes of thoracic spine and
mild discogenic degenerative changes of lumbar spine.

## 2021-02-20 IMAGING — DX LEFT SHOULDER - 2+ VIEW
2 series · 2 of 2 positions shown · non-contrast
Comparison: None.

CLINICAL DATA: Pain after fall last night

EXAM:
LEFT SHOULDER - 2+ VIEW

[shoulder ap]
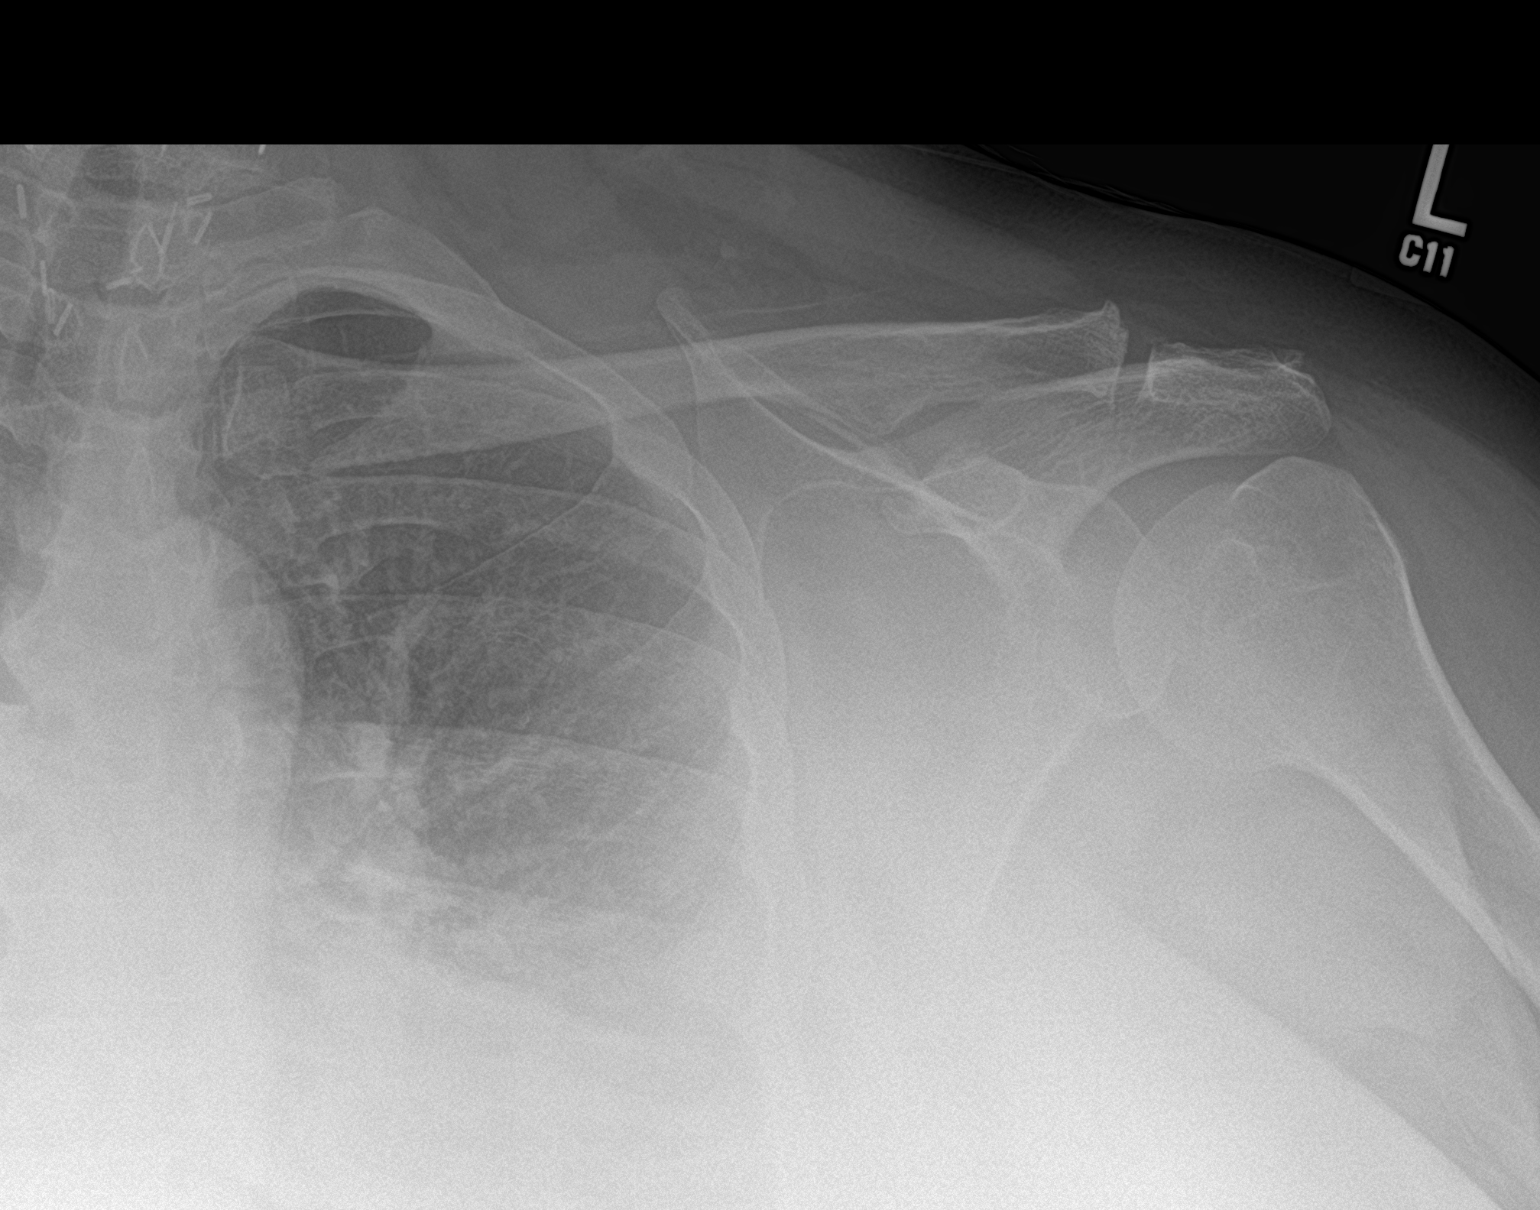

[shoulder y-view]
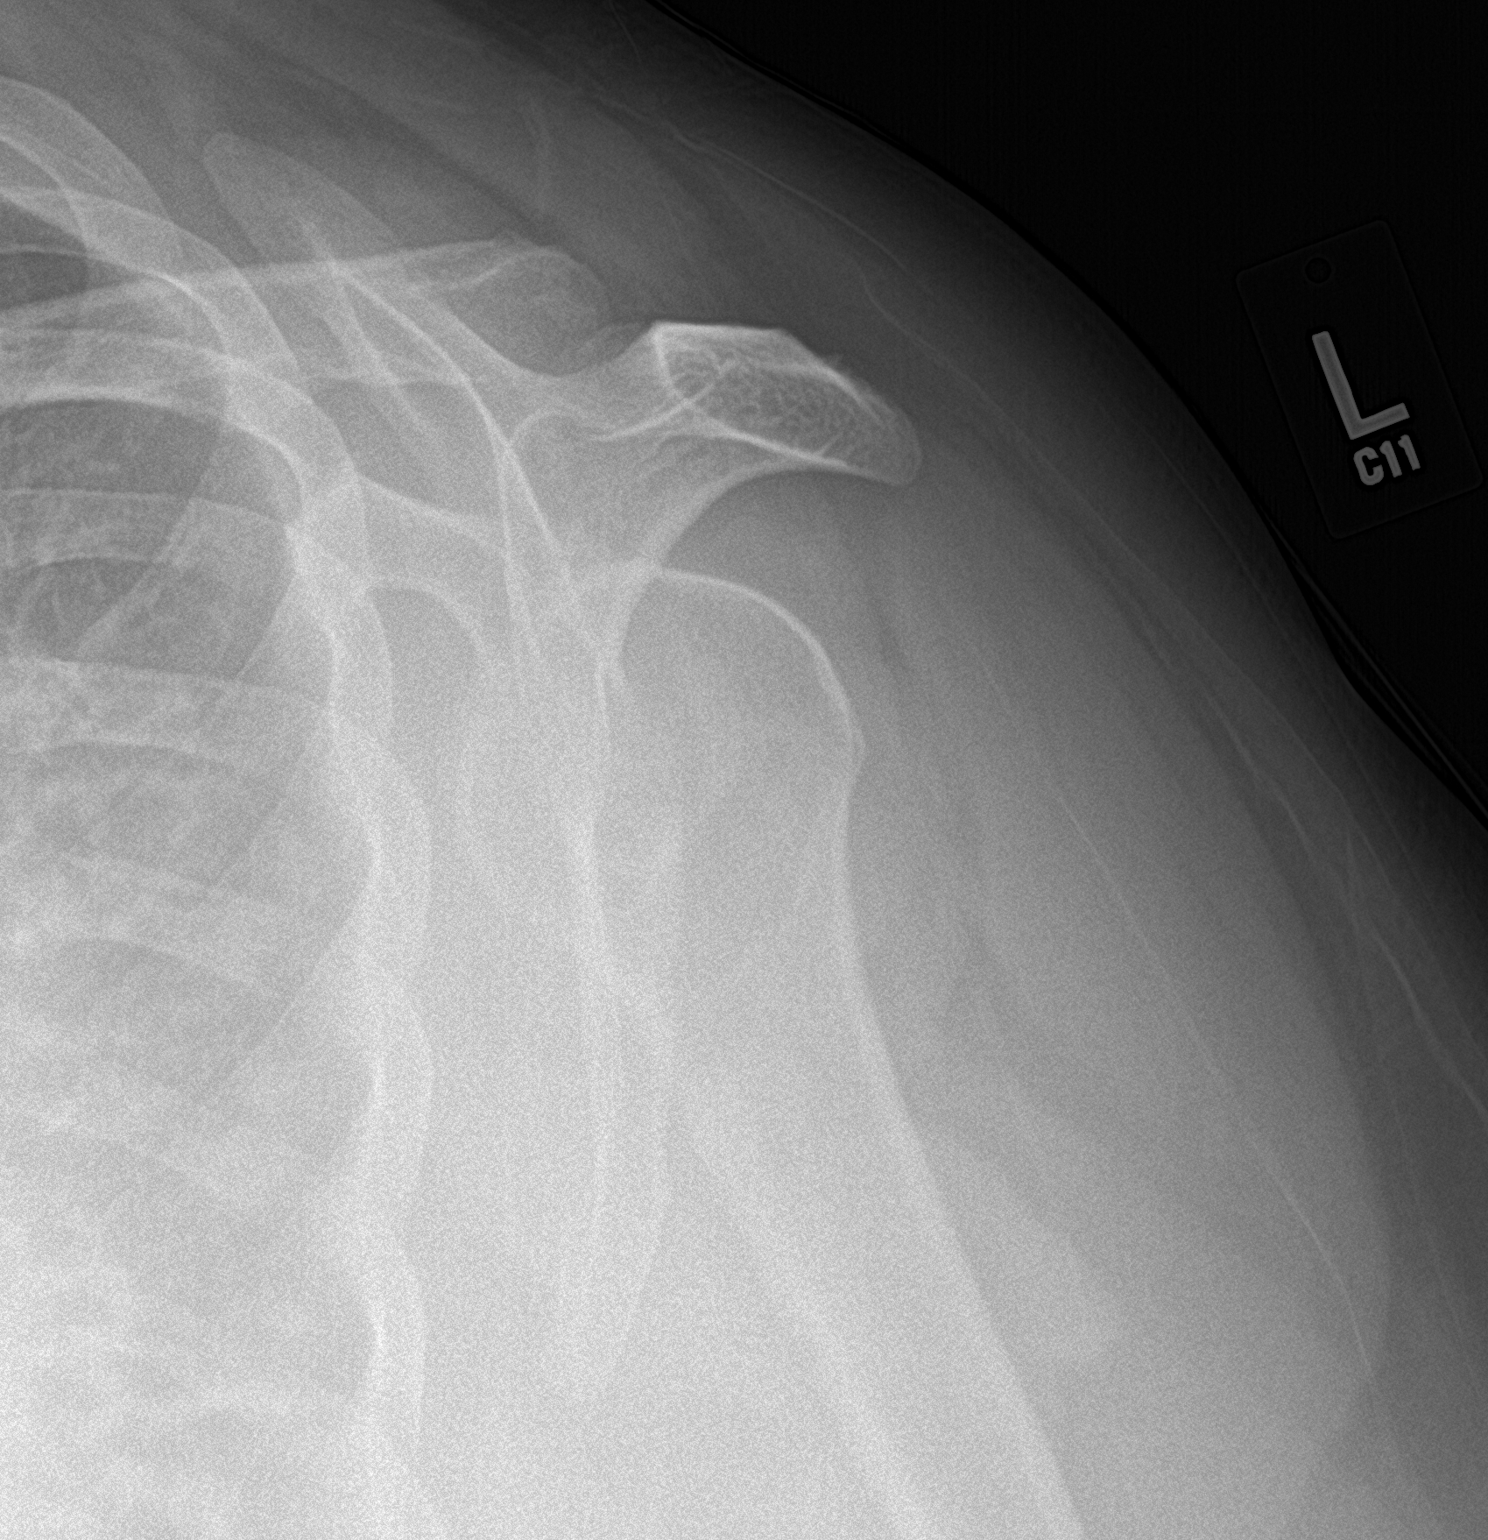

[2 of 2 positions shown; findings below may reference images not displayed]

FINDINGS: Mild AC joint degenerative changes.  No fracture or dislocation.
IMPRESSION: Negative.

## 2021-02-20 IMAGING — CR PELVIS - 1-2 VIEW
2 series · 2 of 2 positions shown · non-contrast
Comparison: None.

CLINICAL DATA: Pelvic pain after fall

EXAM:
PELVIS - 1-2 VIEW

[pelvis ap (1 of 2)]
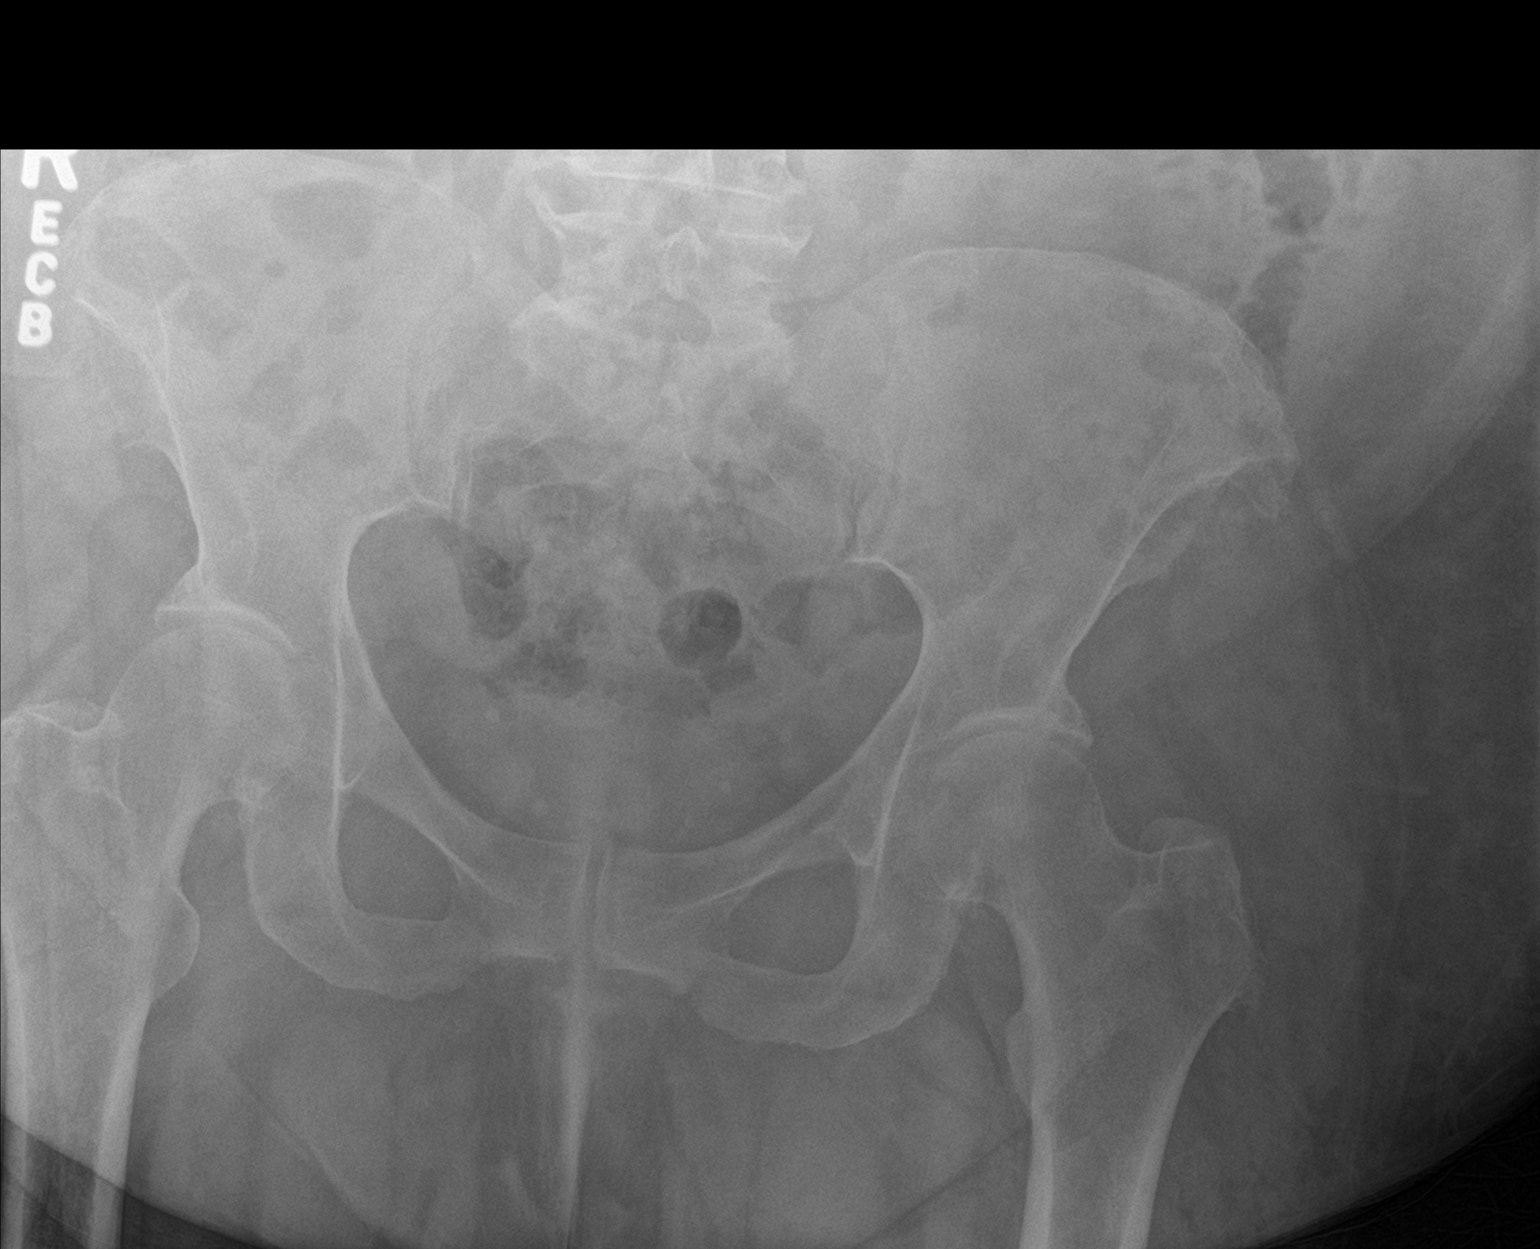

[pelvis ap (2 of 2)]
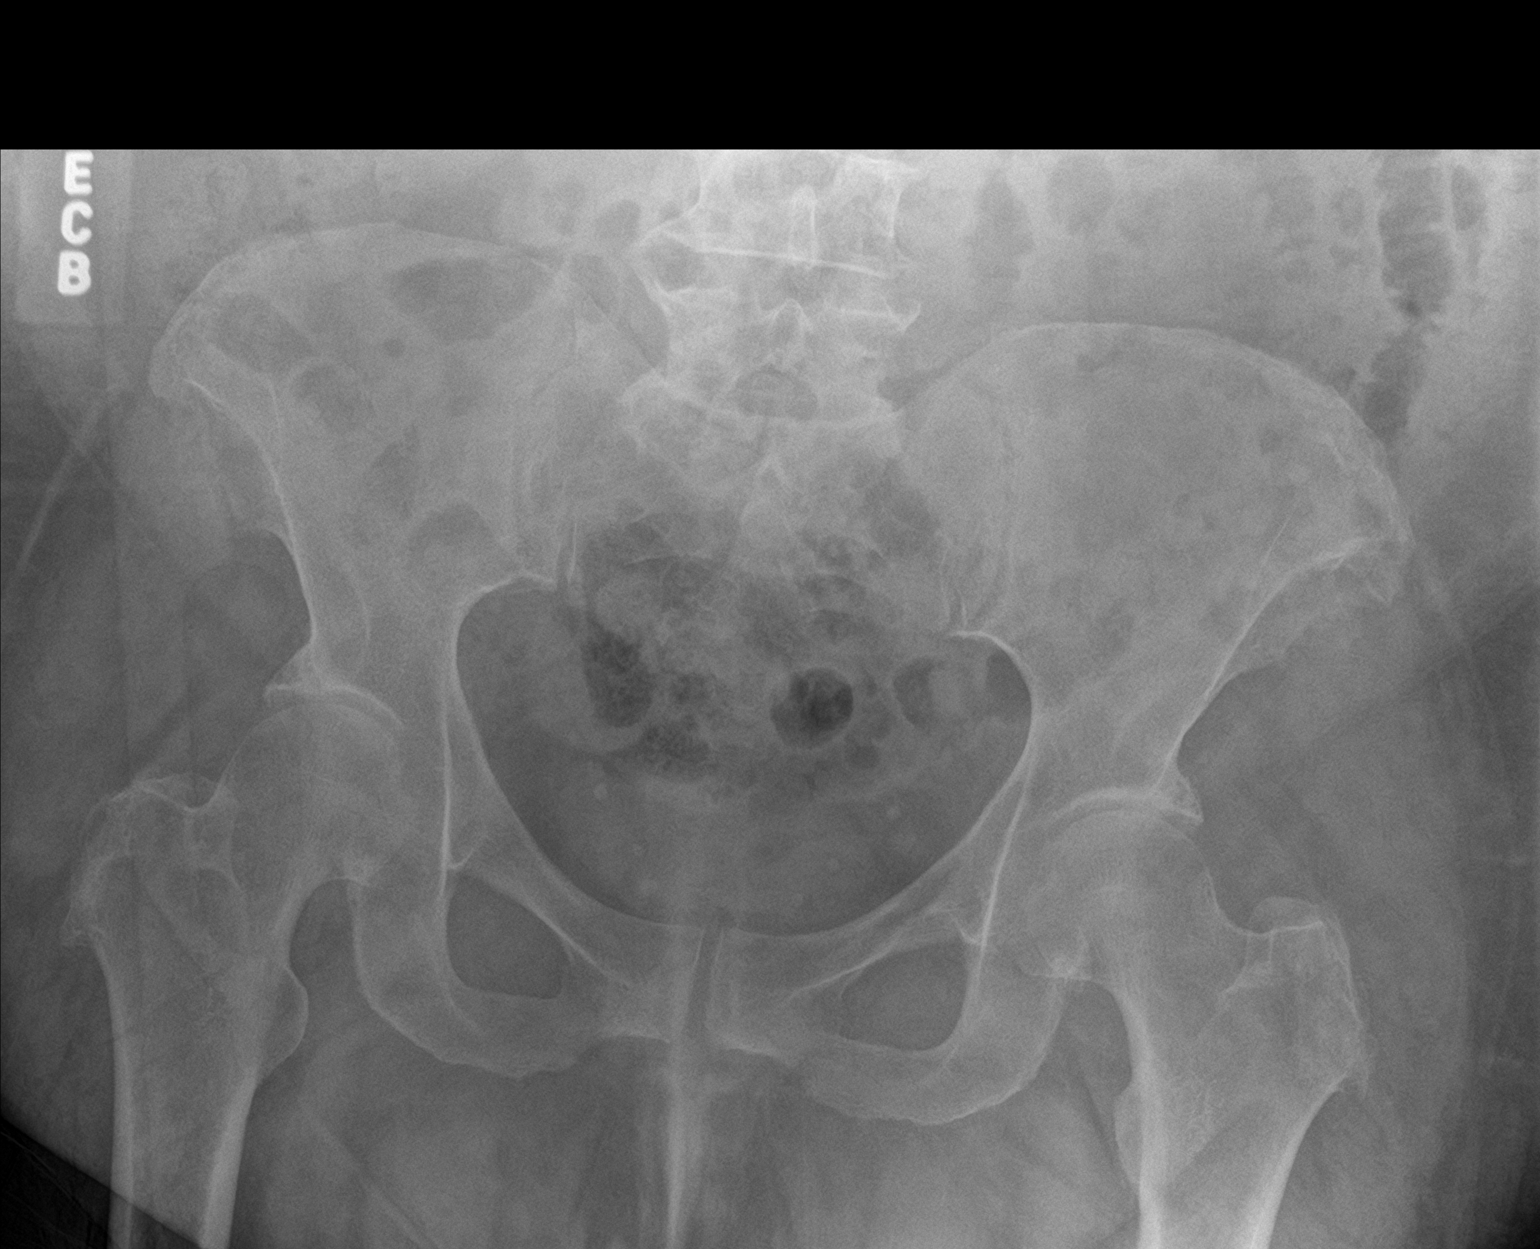

[2 of 2 positions shown; findings below may reference images not displayed]

FINDINGS: No fracture or dislocation of the left hip. There is lucency of the
medial aspect of the left inferior pubic ramus. No other osseous
abnormality.
IMPRESSION: 1. Lucency of the medial aspect of the left inferior pubic ramus is
favored to be a composite shadow artifact, though a minimally
displaced fracture could also have this appearance. CT of the pelvis
would be confirmatory.
2. No fracture or dislocation of the hips.

## 2021-02-20 IMAGING — CT CT OF THE LEFT SHOULDER WITHOUT CONTRAST
3 series · 15 of 34 positions shown, 18 images · non-contrast
Comparison: Left shoulder radiographs today.  CTA chest 06/09/2018.

CLINICAL DATA: 57-year-old female status post fall onto left
shoulder last night. Pain.

EXAM:
CT OF THE UPPER LEFT EXTREMITY WITHOUT CONTRAST
TECHNIQUE: Multidetector CT imaging of the upper left extremity was performed
according to the standard protocol.

[Series 3: shoulder 3.0 b31s st · axial · 0.71mm/px · z∈[-150,+3]mm · 7 of 61 slices shown, 9 images]
[im 5/61  soft-tissue]
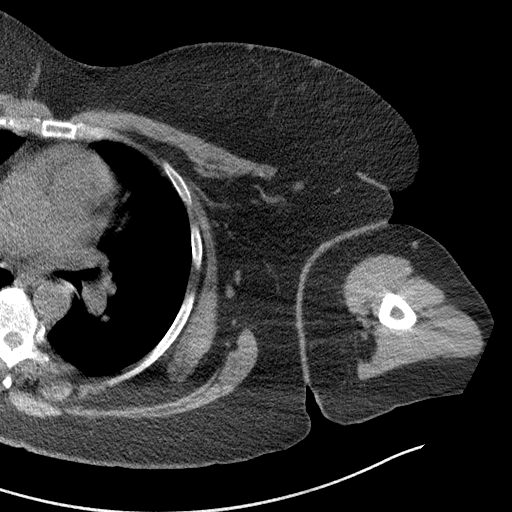
[im 5/61  bone]
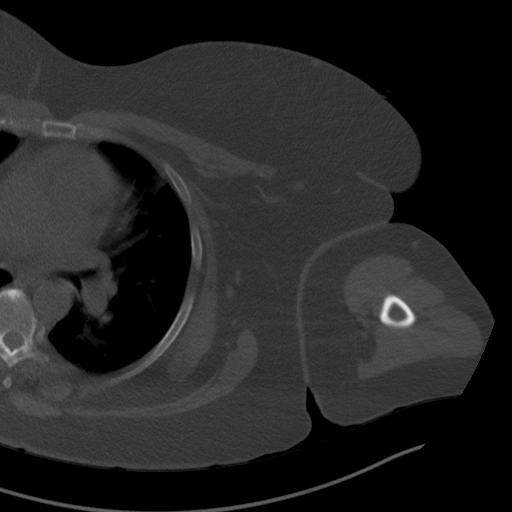
[im 14/61  bone]
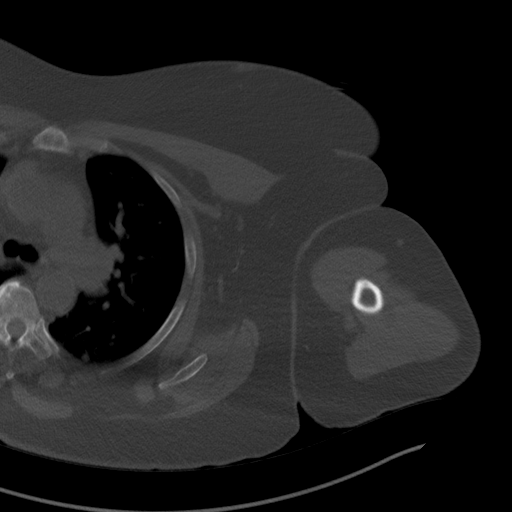
[im 24/61  bone]
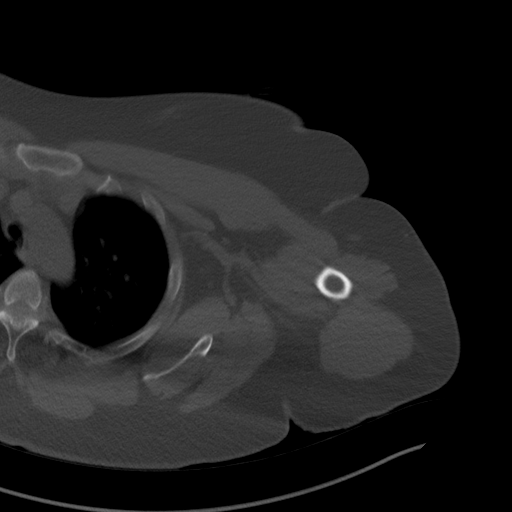
[im 33/61  bone]
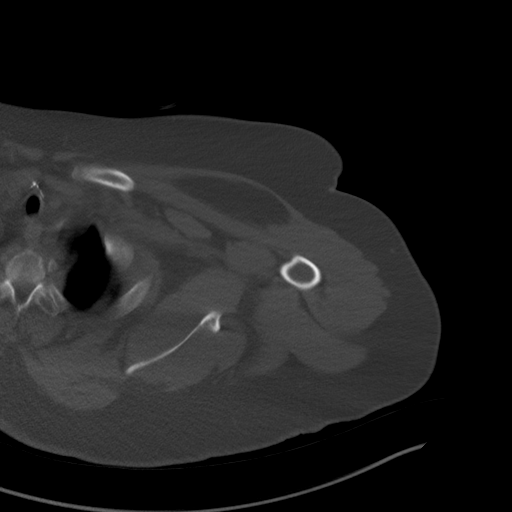
[im 37/61  soft-tissue]
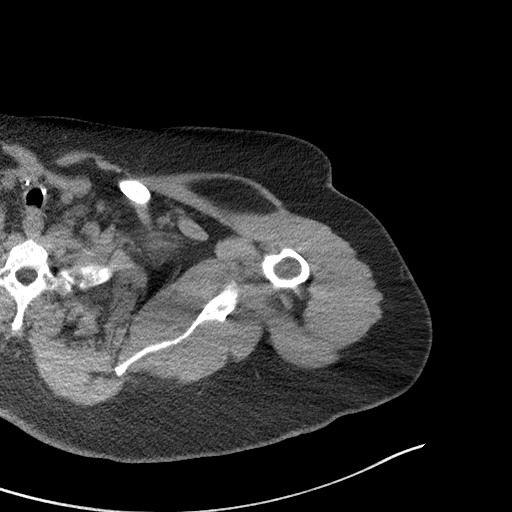
[im 37/61  bone]
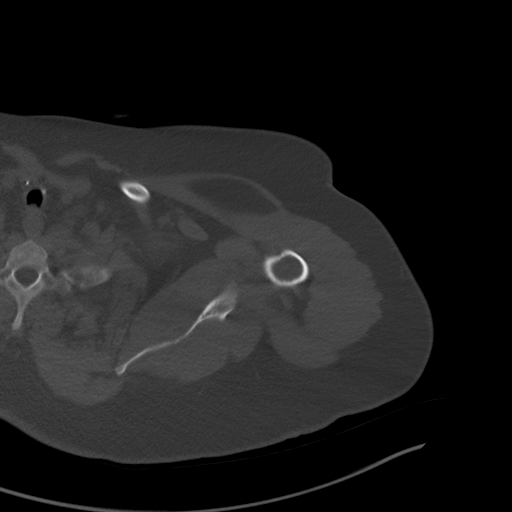
[im 47/61  bone]
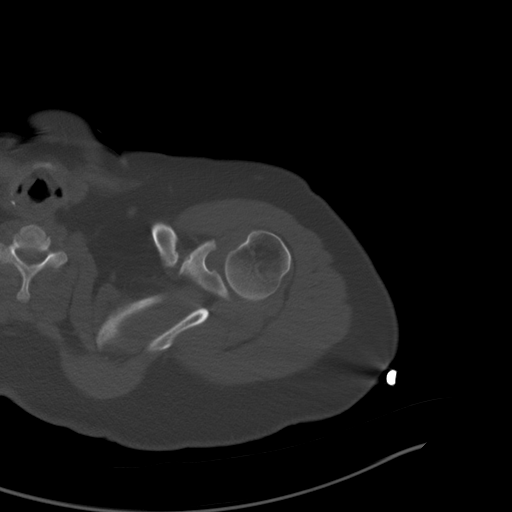
[im 56/61  bone]
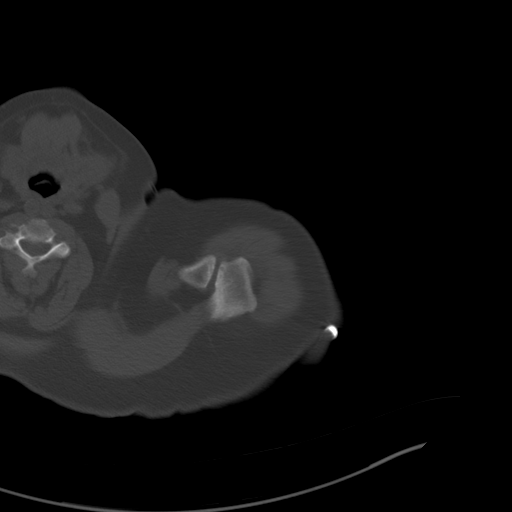

[Series 9: cor st · coronal · 0.50mm/px · 3 of 101 slices shown]
[im 21/101  bone]
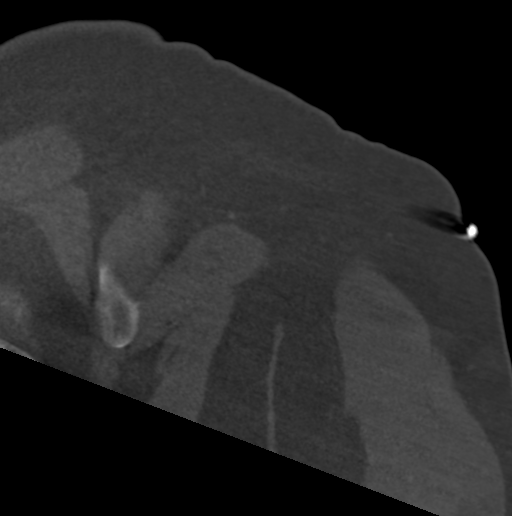
[im 41/101  bone]
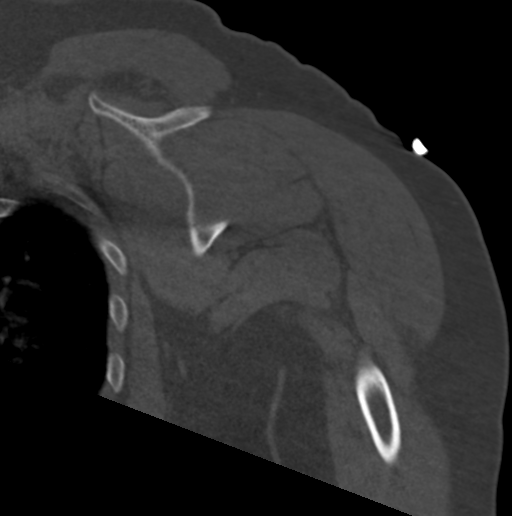
[im 61/101  bone]
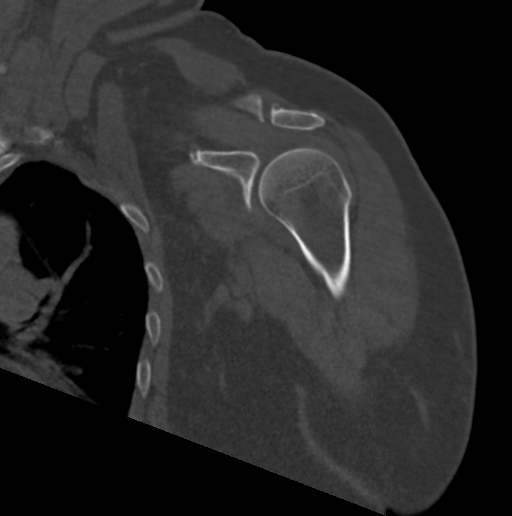

[Series 10: sag st · sagittal · 0.44mm/px · 5 of 125 slices shown, 6 images]
[im 43/125  bone]
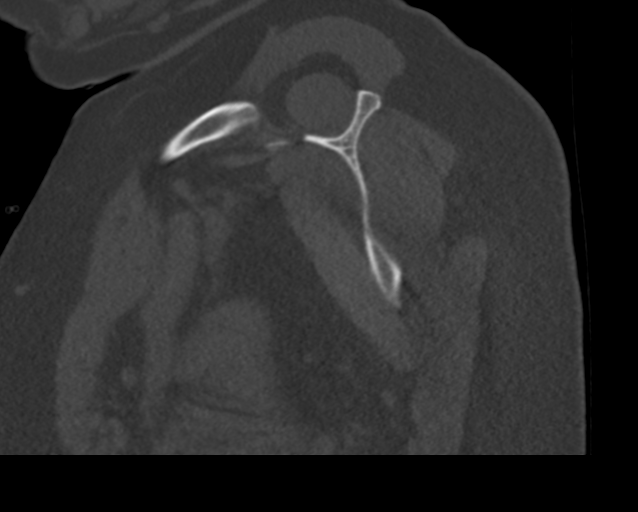
[im 53/125  bone]
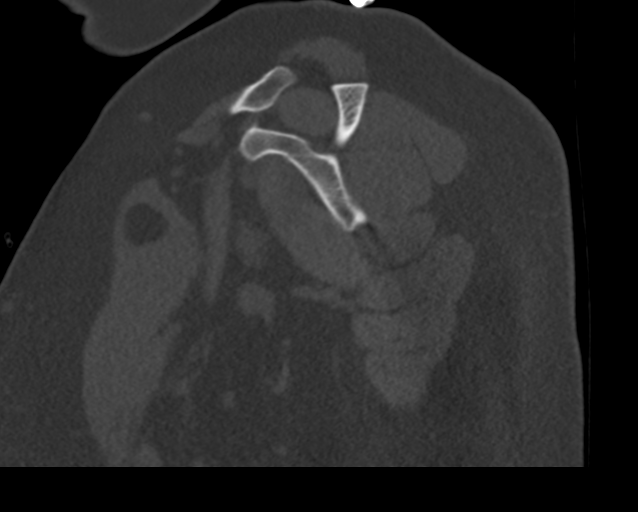
[im 63/125  soft-tissue]
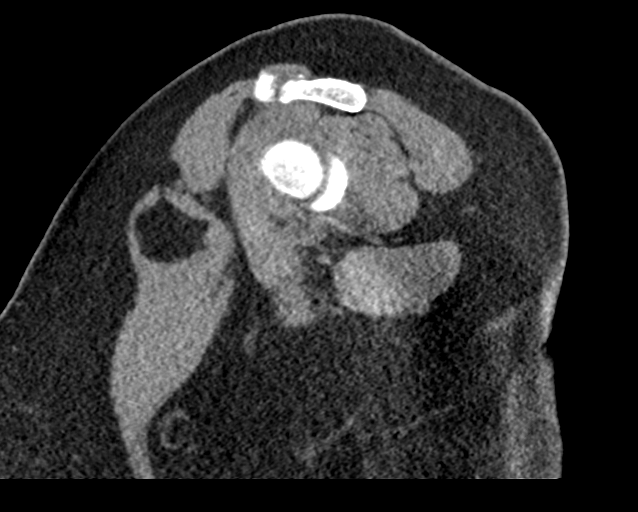
[im 63/125  bone]
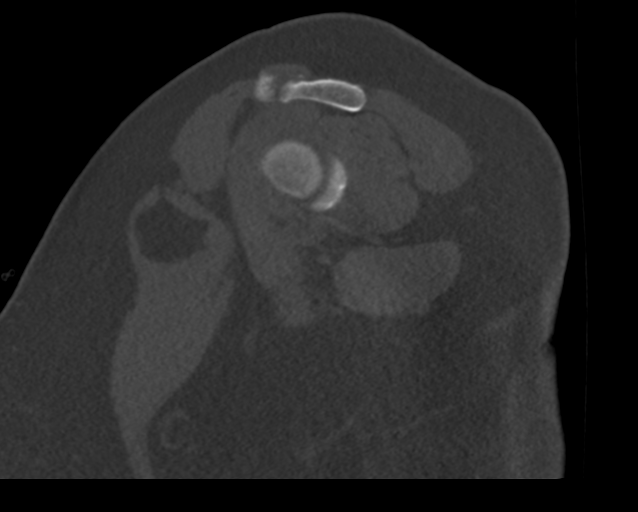
[im 72/125  bone]
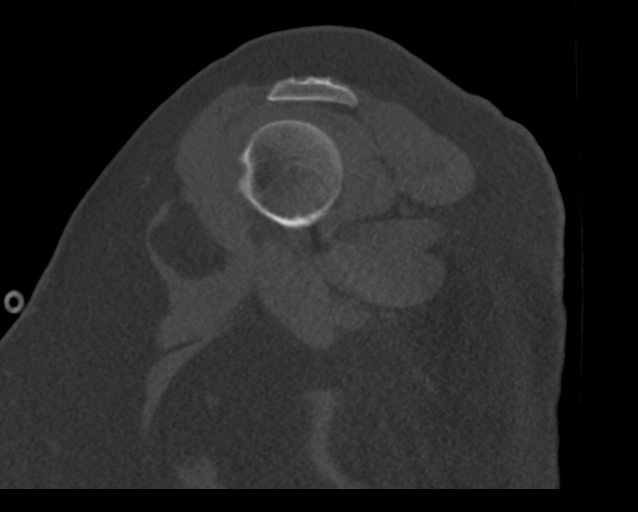
[im 82/125  bone]
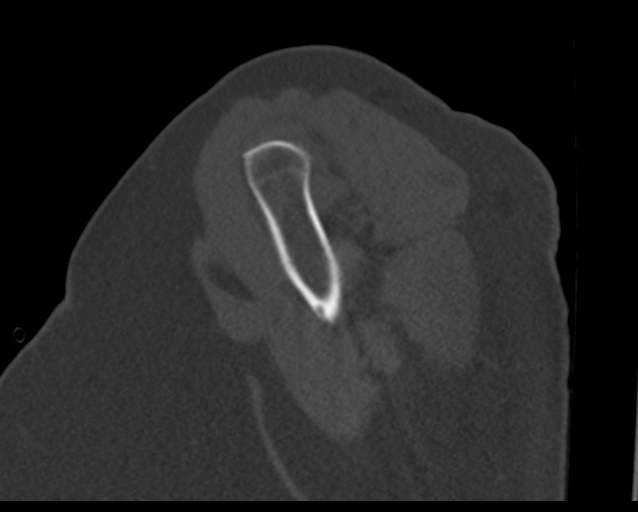

[15 of 34 positions shown; findings below may reference images not displayed]

FINDINGS: No glenohumeral dislocation. Intact proximal left humerus and
visible humerus shaft. Intact left scapula. Intact left clavicle.
Normal acromioclavicular joint alignment. Degenerative changes at
the left AC joint with small subchondral cysts and degenerative
spurring.

Soft tissue surrounding the left shoulder are within normal limits
aside from a benign appearing intramuscular lipoma of the left
pectoralis on series 3, image 27. No left axillary lymphadenopathy.

Visible left ribs and spine appear stable. Sequelae of
thyroidectomy. Stable visible mediastinum. Stable visible left lung
parenchyma.
IMPRESSION: No acute traumatic injury identified about the left shoulder.

## 2021-02-21 IMAGING — MR MRI THORACIC SPINE WITHOUT CONTRAST
4 of 8 series · 19 of 48 positions shown · non-contrast
Comparison: Chest CT 06/09/2018

CLINICAL DATA: Fell at home on June 16, 2018. Mid back pain since
that time.

EXAM:
MRI THORACIC SPINE WITHOUT CONTRAST
TECHNIQUE: Multiplanar, multisequence MR imaging of the thoracic spine was
performed. No intravenous contrast was administered.

[Series 2: T1 · sagittal · 3.0mm · 0.90mm/px · 3 of 12 slices shown]
[im 1/12]
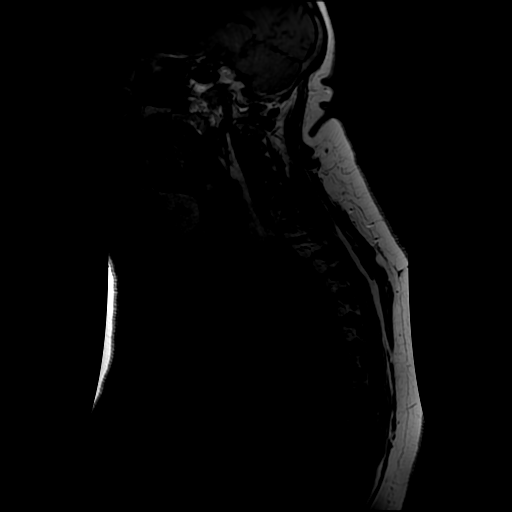
[im 6/12]
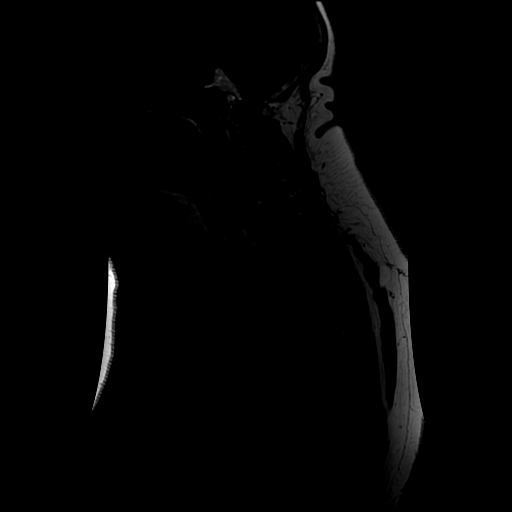
[im 12/12]
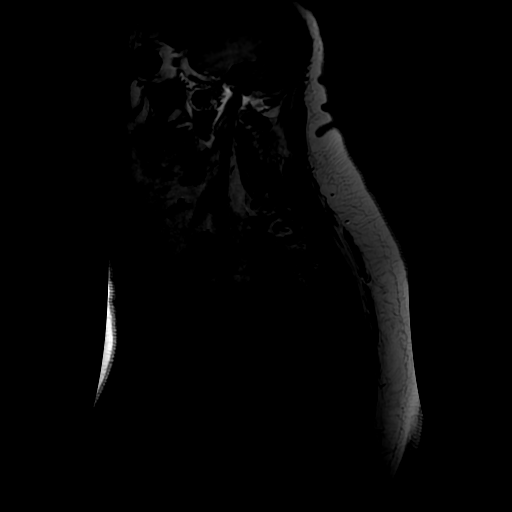

[Series 4: T2 · sagittal · 3.0mm · 0.62mm/px · 5 of 15 slices shown (1 of 3)]
[im 1/15]
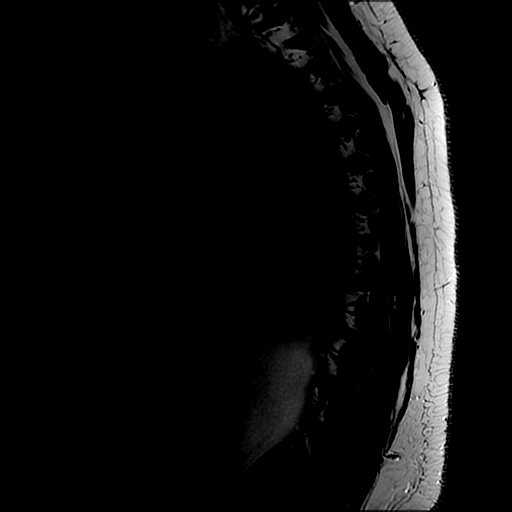
[im 4/15]
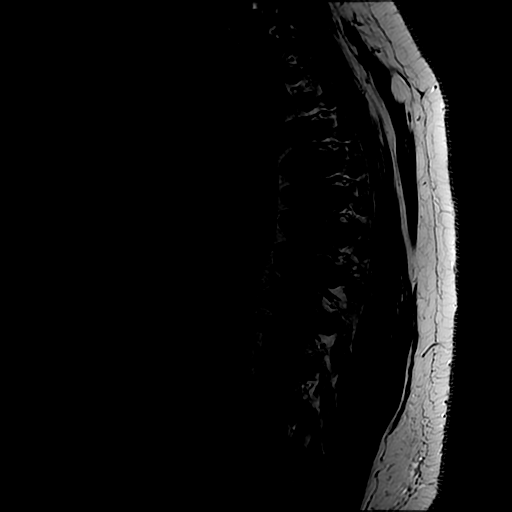
[im 8/15]
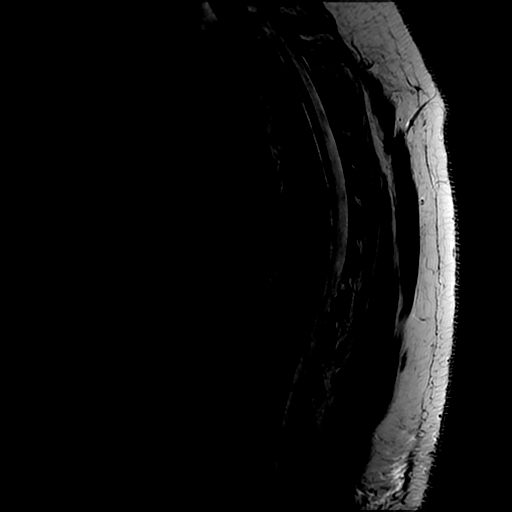
[im 11/15]
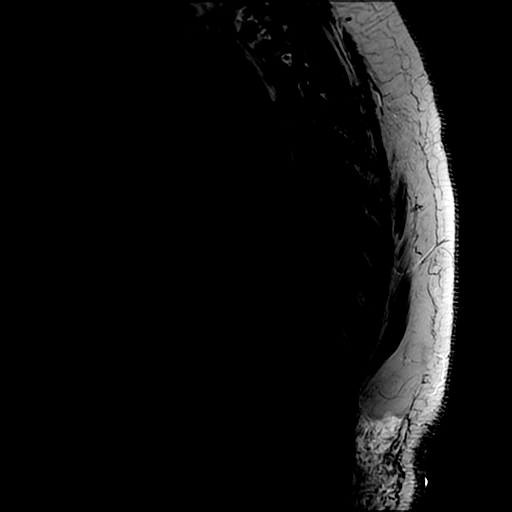
[im 15/15]
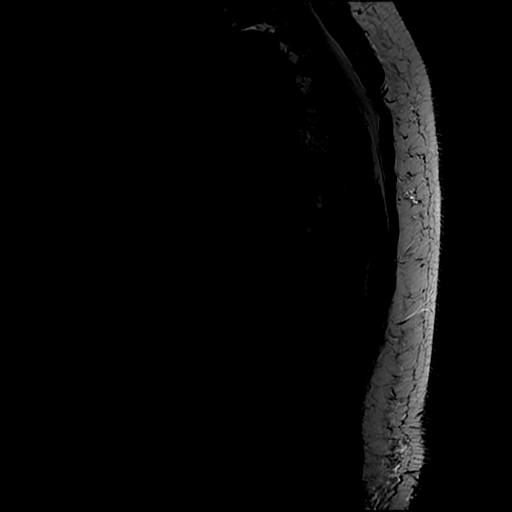

[Series 8: T2 · axial · 4.0mm · 0.39mm/px · z∈[-123,-47]mm · 8 of 24 slices shown (2 of 3)]
[im 1/24]
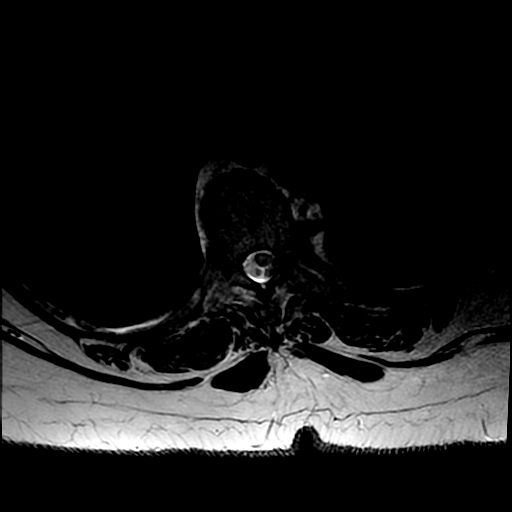
[im 3/24]
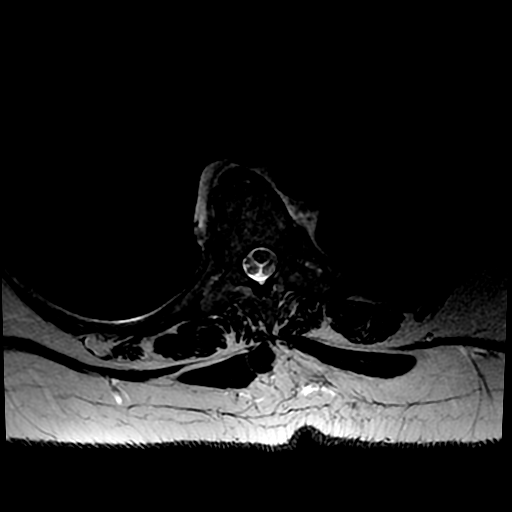
[im 6/24]
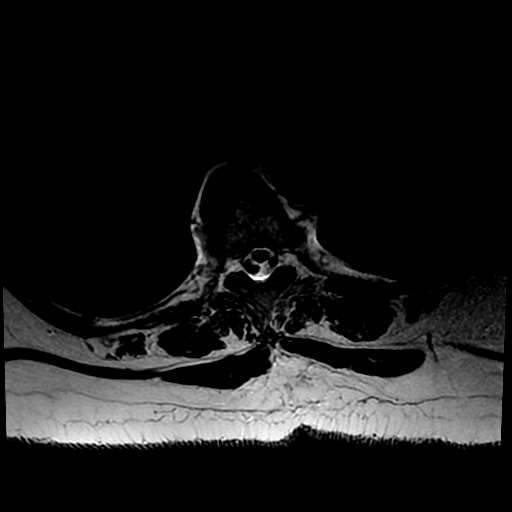
[im 9/24]
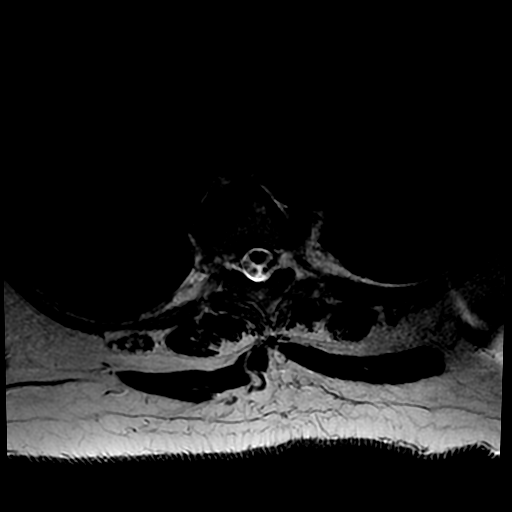
[im 12/24]
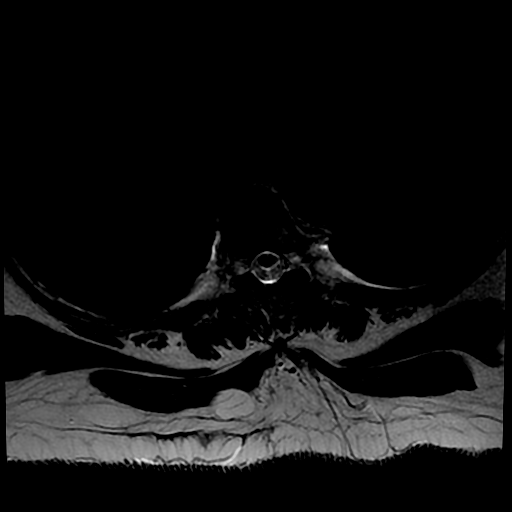
[im 15/24]
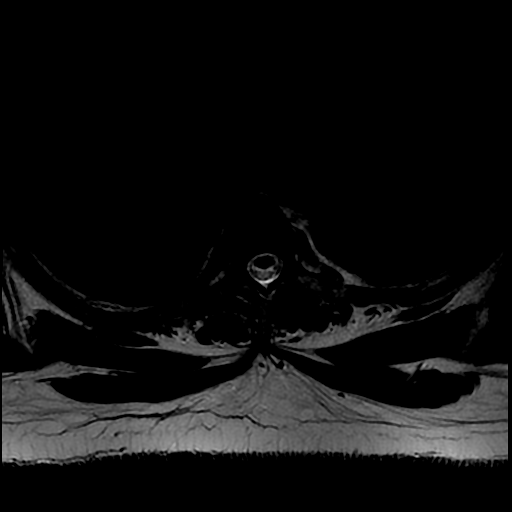
[im 18/24]
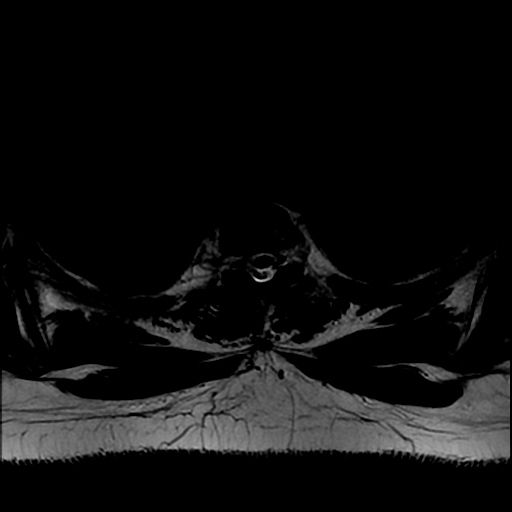
[im 21/24]
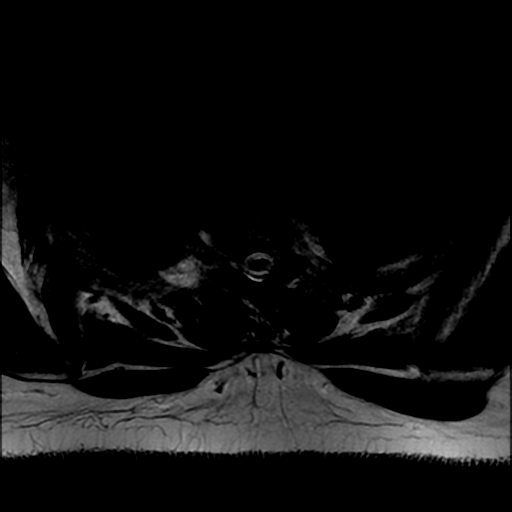

[Series 13: T2 · axial · 4.0mm · 0.39mm/px · z∈[-235,-126]mm · 3 of 15 slices shown (3 of 3)]
[im 1/15]
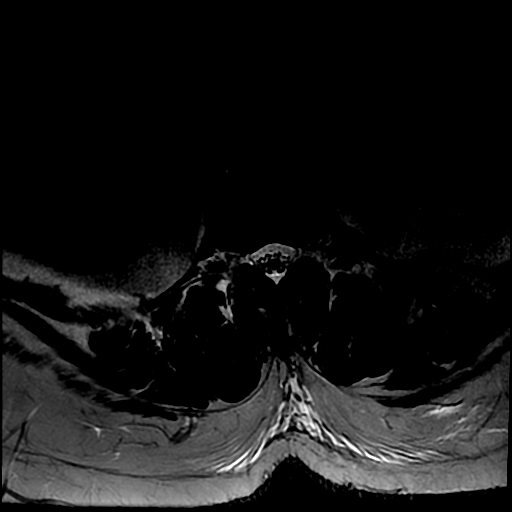
[im 8/15]
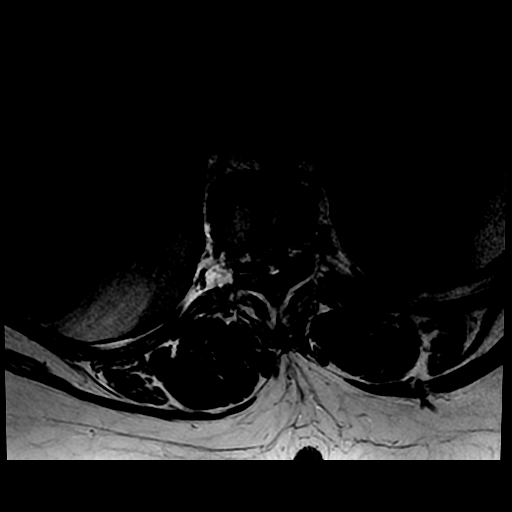
[im 15/15]
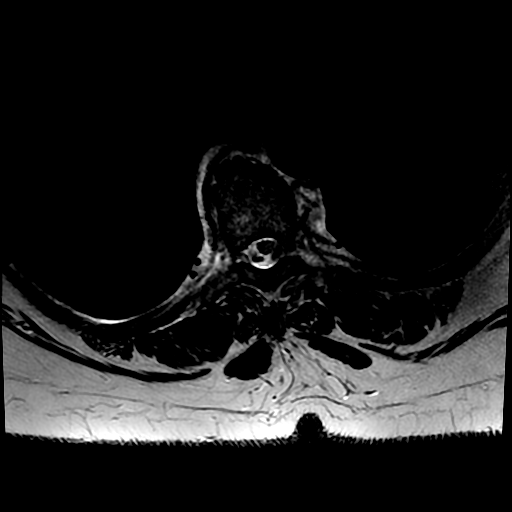

[19 of 48 positions shown; findings below may reference images not displayed]

FINDINGS: Alignment:  Normal

Vertebrae: No acute fracture. No worrisome bone lesions. Small
scattered hemangiomas are noted.

Cord: Normal appearance of the thoracic spinal cord. No cord lesions
or syrinx.

Paraspinal and other soft tissues: No significant findings.

Disc levels:

Small focal right paracentral disc protrusion at T6-7 with mild
impression on the right side of the thecal sac.

Small to moderate-sized focal central disc protrusion at T9-10 with
mild mass effect on the ventral thecal sac.

Small right paracentral disc protrusion at T10-11 with mild mass
effect on the thecal sac.

No foraminal lesions/foraminal stenosis.
IMPRESSION: 1. No acute bony findings.
2. Small thoracic disc protrusions noted at T6-7, T9-10 and T10-11
as detailed above.
3. Normal appearance of the thoracic spinal cord.

## 2021-02-23 ENCOUNTER — Other Ambulatory Visit: Payer: Self-pay

## 2021-02-23 ENCOUNTER — Other Ambulatory Visit: Payer: Self-pay | Admitting: Obstetrics and Gynecology

## 2021-02-23 NOTE — Patient Outreach (Signed)
Medicaid Managed Care   Nurse Care Manager Note  02/23/2021 Name:  Michele Owens MRN:  [id number] DOB:  1961/01/03  Michele Owens is an 60 y.o. year old female who is a primary patient of [name], MD.  The [facility] Managed Care Coordination team was consulted for assistance with:    Chronic healthcare management needs.  Michele Owens was given information about Medicaid Managed Care Coordination team services today. Michele Owens Patient agreed to services and verbal consent obtained.  Engaged with patient by telephone for follow up visit in response to provider referral for case management and/or care coordination services.   Assessments/Interventions:  Review of past medical history, allergies, medications, health status, including review of consultants reports, laboratory and other test data, was performed as part of comprehensive evaluation and provision of chronic care management services.  SDOH (Social Determinants of Health) assessments and interventions performed: SDOH Interventions    Flowsheet Row Most Recent Value  SDOH Interventions   Intimate Partner Violence Interventions Intervention Not Indicated  Stress Interventions Intervention Not Indicated       Care Plan  Allergies  Allergen Reactions   Bee Venom Swelling and Other (See Comments)    "All over my body" (swelling)   Fioricet [Butalbital-Apap-Caffeine] Nausea And Vomiting and Rash   Ibuprofen Rash and Other (See Comments)    Severe rash   Lamisil [Terbinafine] Rash and Other (See Comments)    Pt states this causes her to "feel funny"   Nsaids Other (See Comments)    Per MD's orders     Medications Reviewed Today     Reviewed by [name], RN (Registered Nurse) on 02/23/21 at [place] List Status: <None>   Medication Order Taking? Sig Documenting Provider Last Dose Status Informant  albuterol (PROVENTIL HFA;VENTOLIN HFA) 108 (90 Base) MCG/ACT inhaler [id number] No Inhale 1-2 puffs into the  lungs every 6 (six) hours as needed for wheezing or shortness of breath. Margette Fast, MD Past Week Active Self           Med Note Michele Owens   YHC Jan 08, 2021 12:45 PM)    allopurinol (ZYLOPRIM) 100 MG tablet [id number] No Take 100 mg by mouth daily. [provider] 01/07/2021 Active Self           Med Note Michele Owens   DVV Jan 08, 2021 12:45 PM)    aspirin EC 81 MG tablet [id number] No Take 81 mg by mouth daily. Swallow whole. [provider] 01/08/2021 Active Self  benzonatate (TESSALON) 200 MG capsule [id number] No Take 1 capsule (200 mg total) by mouth in the morning, at Owens, and at bedtime. 7 DS  Patient taking differently: Take 200 mg by mouth 3 (three) times daily as needed for cough.   Elmarie Shiley, MD Past Month Active Self           Med Note Michele Owens   EVO Jan 08, 2021 12:45 PM)    blood glucose meter kit and supplies KIT [id number]  Dispense based on patient and insurance preference. Use up to four times daily as directed. Michele Ishikawa, MD  Active Self  busPIRone (BUSPAR) 5 MG tablet [id number] No Take 5 mg by mouth 3 (three) times daily as needed (anxiety). [provider] 01/08/2021 Active Self  Butalbital-APAP-Caffeine 50-300-40 MG CAPS [id number] No Take 1 capsule by mouth 2 (two) times daily as needed for headache. [provider] Past Week Active Self  calcitRIOL (ROCALTROL) 0.5 MCG capsule [id number] No Take 1 capsule (0.5 mcg total) by mouth daily. Modena Jansky, MD 01/08/2021 Active Self           Med Note Michele Owens   VCB Jan 08, 2021 12:49 PM)    carvedilol (COREG) 6.25 MG tablet [id number] No Take 1 tablet (6.25 mg total) by mouth 2 (two) times daily with a meal. Bensimhon, [name], MD 01/08/2021 0300 Active Self           Med Note Michele Owens   BWG Jan 08, 2021 12:53 PM)    clonazePAM (KLONOPIN) 2 MG tablet [id number] No Take 1 tablet (2 mg total) by mouth 3 (three) times daily. for anxiety  Patient  taking differently: Take 2 mg by mouth 3 (three) times daily.   Michele Pence, MD 08/13/2020 Unknown time Active Self           Med Note Michele Owens   VXB Jan 08, 2021  1:03 PM) Pt is waiting on mail order to send, has not gotten it yet  cyclobenzaprine (FLEXERIL) 10 MG tablet [id number] No Take 10 mg by mouth 3 (three) times daily. [provider] 01/08/2021 Active Self           Med Note Michele Owens   ZRA Jan 08, 2021 12:54 PM)    diclofenac sodium (VOLTAREN) 1 % GEL [id number] No Apply 2 g topically 4 (four) times daily as needed (for pain). [name], MD 01/08/2021 Active Self  EPINEPHrine 0.3 mg/0.3 mL IJ SOAJ injection [id number] No Inject 0.3 mg into the muscle once as needed for anaphylaxis. [provider] unk Active Self           Med Note Michele Owens   LHT Jan 08, 2021 12:50 PM)    FEROSUL 325 (65 Fe) MG tablet [id number] No Take 325 mg by mouth daily. [provider] 01/08/2021 Active Self           Med Note Michele Owens   XBW Jan 08, 2021 12:53 PM)    glucose blood test strip [id number] No Use as instructed to check blood sugar up to 4 times daily  Patient taking differently: 1 each by Other route See admin instructions. Use as instructed to check blood sugar up to 4 times daily   [name], DO unk unk Active Self  ipratropium-albuterol (DUONEB) 0.5-2.5 (3) MG/3ML SOLN [id number] No Take 3 mLs by nebulization 4 (four) times daily.  [provider] 01/07/2021 Active Self           Med Note Michele Owens   WOE Jan 08, 2021  1:02 PM)    levothyroxine (SYNTHROID, LEVOTHROID) 112 MCG tablet [id number] No Take 224 mcg by mouth daily before breakfast. [provider] 01/08/2021 Active Self           Med Note Michele Owens   Sat Jan 08, 2021 12:48 PM)    lisinopril (PRINIVIL,ZESTRIL) 5 MG tablet [id number] No Take 1 tablet (5 mg total) by mouth daily. Please call for office visit [phone]  Patient taking differently: Take  5 mg by mouth daily.   [name], MD 01/08/2021 Active Self           Med Note Michele Owens   EKC Jan 08, 2021 12:52 PM)    metFORMIN (GLUCOPHAGE) 500 MG tablet [id number] No Take 1 tablet (500 mg total) by mouth 2 (two) times daily  with a meal. Michele Owens Redwood, MD 01/08/2021 Active Self           Med Note Michele Owens   FYB Jan 08, 2021 12:53 PM)    nitroGLYCERIN (NITROSTAT) 0.4 MG SL tablet [id number] No Place 1 tablet (0.4 mg total) under the tongue every 5 (five) minutes as needed for chest pain. [name], MD unk Active Self           Med Note Michele Owens   NID Jan 08, 2021 12:59 PM)    oxyCODONE-acetaminophen (PERCOCET) 10-325 MG tablet [id number] No Take 1 tablet by mouth every 6 (six) hours as needed for pain. [provider] 01/08/2021 Active Self           Med Note Michele Owens   RWE Jan 08, 2021 12:58 PM)    OXYGEN [id number] No Inhale 4 L into the lungs continuous. [provider] 01/08/2021 Active Self           Med Note Corky Mull   Mon Apr 12, 2020 10:08 AM)    potassium chloride SA (K-DUR,KLOR-CON) 20 MEQ tablet [id number] No Take 1 tablet (20 mEq total) by mouth 2 (two) times daily. [name], MD 01/08/2021 Active Self           Med Note Michele Owens   ZTI Jan 08, 2021 12:52 PM)    predniSONE (DELTASONE) 20 MG tablet [id number] No Take 2 tablets (40 mg total) by mouth daily.  Patient not taking: No sig reported   Horton, Barbette Hair, MD Completed Course Active Self  rosuvastatin (CRESTOR) 10 MG tablet [id number] No Take 10 mg by mouth every evening. [provider] 01/07/2021 Active Self           Med Note Michele Owens   BHA Jan 08, 2021 12:55 PM)    senna-docusate (SENOKOT-S) 8.6-50 MG tablet [id number] No Take 1 tablet by mouth daily. [provider] 01/07/2021 Active Self           Med Note Michele Owens   XBD Jan 08, 2021 12:54 PM)    torsemide (DEMADEX) 20 MG tablet [id number] No Take 2 tablets  (40 mg total) by mouth 2 (two) times daily. [name], MD 01/08/2021 Active Self           Med Note Michele Owens   STM Jan 08, 2021 12:51 PM)    traZODone (DESYREL) 50 MG tablet [id number] No Take 100 mg by mouth at bedtime. [provider] 01/07/2021 Active Self           Med Note Michele Owens   GXQ Jan 08, 2021 12:54 PM)    triamcinolone cream (KENALOG) 0.1 % [id number]  Apply 1 application topically 2 (two) times daily. [provider]  Active Self           Med Note Rosine Beat Oct 04, 2020  8:40 PM) LF 09/10/2020 [facility] per external pharmacy records   umeclidinium-vilanterol [facility] ELLIPTA) 62.5-25 MCG/INH AEPB [id number] No Inhale 1 puff into the lungs daily. [provider] 01/08/2021 Active Self           Med Note Michele Owens   JSH Jan 08, 2021 12:50 PM)              Patient Active Problem List   Diagnosis Date Noted   Respiratory failure with hypercapnia ([place]) 12/23/2020   Anemia 12/23/2020  Acute on chronic respiratory failure with hypoxia and hypercapnia (HCC) 04/18/2020   Acute on chronic respiratory failure with hypoxia (HCC) 04/18/2020   Hemoptysis 60/60/0459   Acute metabolic encephalopathy 97/74/1423   Acute encephalopathy 04/18/2020   Chronic diastolic CHF (congestive heart failure) ([place]) 04/18/2020   Sepsis ([place]) 04/18/2020   Morbid obesity with BMI of 50.0-59.9, adult ([place])    Fall at home, initial encounter 06/23/2018   HNP (herniated nucleus pulposus), thoracic 06/23/2018   CHF (congestive heart failure) ([place]) 06/18/2018   Hypertensive disorder 08/28/2017   Obesity 08/28/2017   COPD exacerbation ([place]) 08/04/2017   Acute and chronic respiratory failure with hypercapnia ([place])    Pulmonary HTN ([place])    Genetic testing 02/27/2017   Family history of thyroid cancer    Hypomagnesemia 10/03/2016   Atypical chest pain    HLD (hyperlipidemia) 09/23/2016   Gout 09/23/2016   DVT (deep vein thrombosis) in  pregnancy 09/23/2016   Thyroid cancer ([place]) 09/23/2016   Hypocalcemia 09/23/2016   AKI (acute kidney injury) ([place]) 09/23/2016   Acute pulmonary edema (HCC)    Acute hypoxemic respiratory failure (HCC)    Ventilator dependent (HCC)    Chronic respiratory failure with hypoxia and hypercapnia ([place]) 07/16/2016   CAP (community acquired pneumonia) 01/23/2016   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 01/23/2016   Pulmonary hypertension ([place]) 01/23/2016   Obesity hypoventilation syndrome ([place]) 08/26/2015   Dyslipidemia associated with type 2 diabetes mellitus ([place]) 04/24/2015   Leukocytosis 04/24/2015   Controlled type 2 diabetes mellitus without complication, without long-term current use of insulin ([place]) 04/24/2015   Anxiety 04/24/2015   Benign essential HTN 04/24/2015   Normocytic anemia 04/24/2015   OSA (obstructive sleep apnea) 05/10/2012   Tobacco abuse 07/04/2011    Conditions to be addressed/monitored per PCP order:   chronic healthcare management needs,    OSA    Dyslipidemia associated with type 2 diabetes mellitus    Controlled type 2 diabetes mellitus without complication, without long-term current use of insulin   Anxiety   Obesity hypoventilation syndrome    CAP    Pulmonary hypertension    Chronic respiratory failure with hypoxia and hypercapnia   HLD    Obesity   Morbid obesity with BMI of 50.0-59.9, adult  Care Plan : General Plan of Care (Adult)  Updates made by [name], RN since 02/23/2021 12:00 AM     Problem: Barriers to Treatment   Priority: High  Onset Date: 04/23/2020     Long-Range Goal: Barriers to Treatment Identified and Managed   Start Date: 05/03/2020  Expected End Date: 05/26/2021  Recent Progress: On track  Priority: Medium  Note:   CARE PLAN ENTRY Medicaid Managed Care (see longitudinal plan of care for additional care plan information)  Current Barriers:  Transportation barriers-patient utilizing [facility] and [facility] as needed. Limited access to caregiver-patient needs PCS, currently not receiving services she is expecting. Patient choosing not to use aides provided to her-discussed finding another agency with patient 02/23/21: Patient states she is without an aide again-said she will call agency today.  Nurse Clinical Goal(s):  Over the next 30 days, patient will work with [employer] to address needs related to transportation. Update 05/12/20:  Patient has used [employer] and is very pleased. Over the next 90 days, patient will attend all scheduled medical appointments. RNCM will make referral to SW for assistance with PCS Update 05/27/20: BSW spoke with patient about PCS, she stated someone usually  comes in 3 days a week for a few hours. She stated her aide was off today. BSW contacted [place] and spoke with [facility], she stated that patient has 13 hours per week with 6 days a week, however patient chooses to have an aide come out 3 days a week so that she can have more hours. The aide assist with most ADL's. Update 06/04/20: Patient stated she is ready to move to [facility]. She has already started the process but wants to save some money first and she has to wait on her nephew who will drive the uhaul truck. Update 06/24/20: BSW contacted patient to follow up on her moving to [facility], she stated she is still saving money. Patient did cut call short because she was sleeping, patient stated she was not in need of any resources at this time. Update 08/27/20: Bsw received information from [name] about patient needing some batteries for her remote. BSW contacted patient to see if any resources were needed. Patient stated she contacted [employer] and they informed her it would cost 40 dollars to replace her remote, patient states she only needs batteries. BSW asked if patient had anyone to call that could bring her some batteries, patient stated no. She  states her daughter is mad at her right now and working so she does not talk to her, patient states she has not family to call. BSW asked if her aide could help, patient states she has not had an aide in about a week, she had to let her last aide go because she smelled like marijuana and they have not replaced her. BSW contacted patients healthcare agency [place] at [phone] and left a voicemail. Update 09/30/20:  Patient has aides available to her at [facility] but chooses not to use them. Discussion with [facility] and they continue to look for another aide for Ms. Burkley. Discussed with patient about her finding a new agency. 10/05/20: BSW received a telephone call from [facility] with patient on the line. Patient stated that she went to the ED and her eye was still hurting from a fall. Patient stated she has the information for her eye doctor and was getting ready to contact them. Patient stated she has not recieieved an aide since last Thursday.  Update 10/26/20:  Patient now has aide coming M-F 10-12, "Lucy" Update 11/26/20:  Patient with no aide right now-has chosen not to utilize aides offered to her. Update 12/27/20:  Patient currently using [facility]?  Interventions: Inter-disciplinary care team collaboration (see longitudinal plan of care) Referred patient to community resources care guide team for assistance with transportation. Provided patient with information about [facility] and [employer]. Discussed plans with patient for ongoing care management follow up and provided patient with direct contact information for care management team Advised patient to call [place] or [employer]. Collaborated with [employer] and [place] regarding transportation. Assisted patient/caregiver with obtaining information about health plan benefits Collaborated with Social Work regarding PCS Patient Self Care Activities:  Patient will  call [employer]. Update 06/07/20:  Patient continues to utilize [facility] with success. Update 06/14/20:  Patient has missed appointments due to transportation no shows.  [facility] transportation has been utilized as needed. Update 09/30/20:  Patient with no transportation problems right now-uses [facility] or [facility] as needed for appointments with success.    Care Plan : General Plan of Care (Adult)  Updates made by [name]  G, RN since 02/23/2021 12:00 AM     Problem: Health Promotion or Disease Self-Management (General Plan of Care)   Priority: High  Onset Date: 04/23/2020     Long-Range Goal: Self-Management Plan Developed   Start Date: 04/23/2020  Expected End Date: 05/26/2021  Recent Progress: On track  Priority: Medium  Note:   Current Barriers:  Chronic Disease Management support and education needs: No complaints today:  lipidemia associated with type 2 diabetes mellitus,   Controlled type 2 diabetes mellitus without complication, without long-term current use of insulin  Anxiety, Obesity hypoventilation syndrome,CAP(community acquired pneumonia)Pulmonary hypertension, Chronic respiratory failure with hypoxia and hypercapnia, HLD,  Obesity.  Nurse Case Manager Clinical Goal(s):  Over the next 30 days, patient will attend all scheduled medical appointments:  Over the next 30 days, patient will work with CM team pharmacist to review medications.  Interventions:  Inter-disciplinary care team collaboration (see longitudinal plan of care) Evaluation of current treatment plan and patient's adherence to plan as established by provider. Reviewed medications with patient. Collaborated with pharmacist regarding medications. Discussed plans with patient for ongoing care management follow up and provided patient with direct contact information for care management team Reviewed scheduled/upcoming provider appointments.  Pharmacy referral for medication  review.  Patient Goals/Self-Care Activities Over the next 30 days, patient will:  -Attends all scheduled provider appointments Calls pharmacy for medication refills Calls provider office for new concerns or questions  Follow Up Plan: The Managed Medicaid care management team will reach out to the patient again over the next 30 days.  The patient has been provided with contact information for the Managed Medicaid care management team and has been advised to call with any health related questions or concerns.     Follow Up:  Patient agrees to Care Plan and Follow-up.  Plan: The Managed Medicaid care management team will reach out to the patient again over the next 30 days. and The  Patient has been provided with contact information for the Managed Medicaid care management team and has been advised to call with any health related questions or concerns.  Date/time of next scheduled RN care management/care coordination outreach:  03/25/21 at 0900.

## 2021-02-23 NOTE — Patient Instructions (Signed)
Visit Information  Ms. Glanz was given information about Medicaid Managed Care team care coordination services as a part of their [facility] Medicaid benefit. [name] verbally consented to engagement with the [facility] Managed Care team.   If you are experiencing a medical emergency, please call 911 or report to your local emergency department or urgent care.   If you have a non-emergency medical problem during routine business hours, please contact your provider's office and ask to speak with a nurse.   For questions related to your [facility] health plan, please call: [phone] or go here:[email or web address]  If you would like to schedule transportation through your [facility] plan, please call the following number at least 2 days in advance of your appointment: [phone].  Call the [place] at [phone], at any time, 24 hours a day, 7 days a week. If you are in danger or need immediate medical attention call 911.  If you would like help to quit smoking, call 1-800-QUIT-NOW (609)114-7258) OR Espaol: 1-855-Djelo-Ya (8-563-149-7026) o para ms informacin haga clic aqu or Text READY to 200-400 to register via text  Ms. Hewes - following are the goals we discussed in your visit today:   Goals Addressed             This Visit's Progress    Protect My Health       Timeframe:  Long-Range Goal Priority:  Medium Start Date:    04/23/20                         Expected End Date: ongoing           Follow Up Date: 03/25/21   - schedule appointment for vaccines needed due to my age or health - schedule recommended health tests (blood work, mammogram, colonoscopy, pap test) - schedule and keep appointment for annual check-up   Update 02/23/21:  No complaints today.   The patient verbalized understanding of instructions provided today and declined a print copy of patient instruction materials.   The Managed Medicaid care  management team will reach out to the patient again over the next 30 days.  The  Patient  has been provided with contact information for the Managed Medicaid care management team and has been advised to call with any health related questions or concerns.   [name] RN, BSN [place]  Triad Curator - Managed Medicaid High Risk [phone].   Following is a copy of your plan of care:  Care Plan : General Plan of Care (Adult)  Updates made by [name], RN since 02/23/2021 12:00 AM     Problem: Barriers to Treatment   Priority: High  Onset Date: 04/23/2020     Long-Range Goal: Barriers to Treatment Identified and Managed   Start Date: 05/03/2020  Expected End Date: 05/26/2021  Recent Progress: On track  Priority: Medium  Note:   CARE PLAN ENTRY Medicaid Managed Care (see longitudinal plan of care for additional care plan information)  Current Barriers:  Transportation barriers-patient utilizing [facility] and [employer] as needed. Limited access to caregiver-patient needs PCS, currently not receiving services she is expecting. Patient choosing not to use aides provided to her-discussed finding another agency with patient 02/23/21: Patient states she is without an aide again-said she will call agency today.  Nurse Clinical Goal(s):  Over the next 30 days, patient will work with [employer] to address needs related to transportation.  Update 05/12/20:  Patient has used [employer] and is very pleased. Over the next 90 days, patient will attend all scheduled medical appointments. RNCM will make referral to SW for assistance with PCS Update 05/27/20: BSW spoke with patient about PCS, she stated someone usually comes in 3 days a week for a few hours. She stated her aide was off today. BSW contacted [place] and spoke with [facility], she stated that patient has 13 hours per week with 6  days a week, however patient chooses to have an aide come out 3 days a week so that she can have more hours. The aide assist with most ADL's. Update 06/04/20: Patient stated she is ready to move to [facility]. She has already started the process but wants to save some money first and she has to wait on her nephew who will drive the uhaul truck. Update 06/24/20: BSW contacted patient to follow up on her moving to [facility], she stated she is still saving money. Patient did cut call short because she was sleeping, patient stated she was not in need of any resources at this time. Update 08/27/20: Bsw received information from [facility] about patient needing some batteries for her remote. BSW contacted patient to see if any resources were needed. Patient stated she contacted [employer] and they informed her it would cost 40 dollars to replace her remote, patient states she only needs batteries. BSW asked if patient had anyone to call that could bring her some batteries, patient stated no. She states her daughter is mad at her right now and working so she does not talk to her, patient states she has not family to call. BSW asked if her aide could help, patient states she has not had an aide in about a week, she had to let her last aide go because she smelled like marijuana and they have not replaced her. BSW contacted patients healthcare agency [place] at [phone] and left a voicemail. Update 09/30/20:  Patient has aides available to her at [facility] but chooses not to use them. Discussion with [facility] and they continue to look for another aide for Ms. Cieslewicz. Discussed with patient about her finding a new agency. 10/05/20: BSW received a telephone call from [facility] with patient on the line. Patient stated that she went to the ED and her eye was still hurting from a fall. Patient stated she has the information for her eye doctor and was getting ready to contact them. Patient stated she has  not recieieved an aide since last Thursday.  Update 10/26/20:  Patient now has aide coming M-F 10-12, "Lucy" Update 11/26/20:  Patient with no aide right now-has chosen not to utilize aides offered to her. Update 12/27/20:  Patient currently using [facility]?  Interventions: Inter-disciplinary care team collaboration (see longitudinal plan of care) Referred patient to community resources care guide team for assistance with transportation. Provided patient with information about [facility] and [employer]. Discussed plans with patient for ongoing care management follow up and provided patient with direct contact information for care management team Advised patient to call [place] or [employer]. Collaborated with [employer] and [place] regarding transportation. Assisted patient/caregiver with obtaining information about health plan benefits Collaborated with Social Work regarding PCS Patient Self Care Activities:  Patient will call [employer]. Update 06/07/20:  Patient continues to utilize [facility] with success. Update 06/14/20:  Patient has missed appointments due to transportation  no shows.  [facility] transportation has been utilized as needed. Update 09/30/20:  Patient with no transportation problems right now-uses [employer] or [place] as needed for appointments with success.    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  Nurse Case Manager Clinical Goal(s):  Over the next 30 days, patient will attend all scheduled medical appointments:  Over the next 30 days, patient will work with CM team pharmacist to review medications.  Interventions:  Inter-disciplinary care team collaboration (see longitudinal plan of care) Evaluation of current treatment plan and patient's adherence to plan as established by provider. Reviewed medications with patient. Collaborated with pharmacist regarding medications. Discussed plans with patient for ongoing care management follow up and provided patient with direct contact information for care management team Reviewed scheduled/upcoming provider appointments.  Pharmacy referral for medication review.  Patient Goals/Self-Care Activities Over the next 30 days, patient will:  -Attends all scheduled provider appointments Calls pharmacy for medication refills Calls provider office for new concerns or questions  Follow Up Plan: The Managed Medicaid care management team will reach out to the patient again over the next 30 days.  The patient has been provided with contact information for the Managed Medicaid care management team and has been advised to call with any health related questions or concerns.

## 2021-03-01 ENCOUNTER — Other Ambulatory Visit: Payer: Self-pay

## 2021-03-01 ENCOUNTER — Other Ambulatory Visit: Payer: Medicaid Other | Admitting: Obstetrics and Gynecology

## 2021-03-01 NOTE — Patient Outreach (Signed)
Care Coordination  03/01/2021  Michele Owens 09/03/1960 [id number]  RNCM returned patient's phone call-no answer, left message.  [name] RN, BSN [place]  [facility] Curator - Managed Medicaid High Risk [phone].

## 2021-03-02 ENCOUNTER — Institutional Professional Consult (permissible substitution): Payer: Medicaid Other | Admitting: Cardiology

## 2021-03-02 IMAGING — CR RIGHT KNEE - COMPLETE 4+ VIEW
4 series · 4 of 4 positions shown · non-contrast
Comparison: None.

CLINICAL DATA: 57-year-old female status post fall from car onto
right lower extremity. Pain.

EXAM:
RIGHT KNEE - COMPLETE 4+ VIEW

[knee ap]
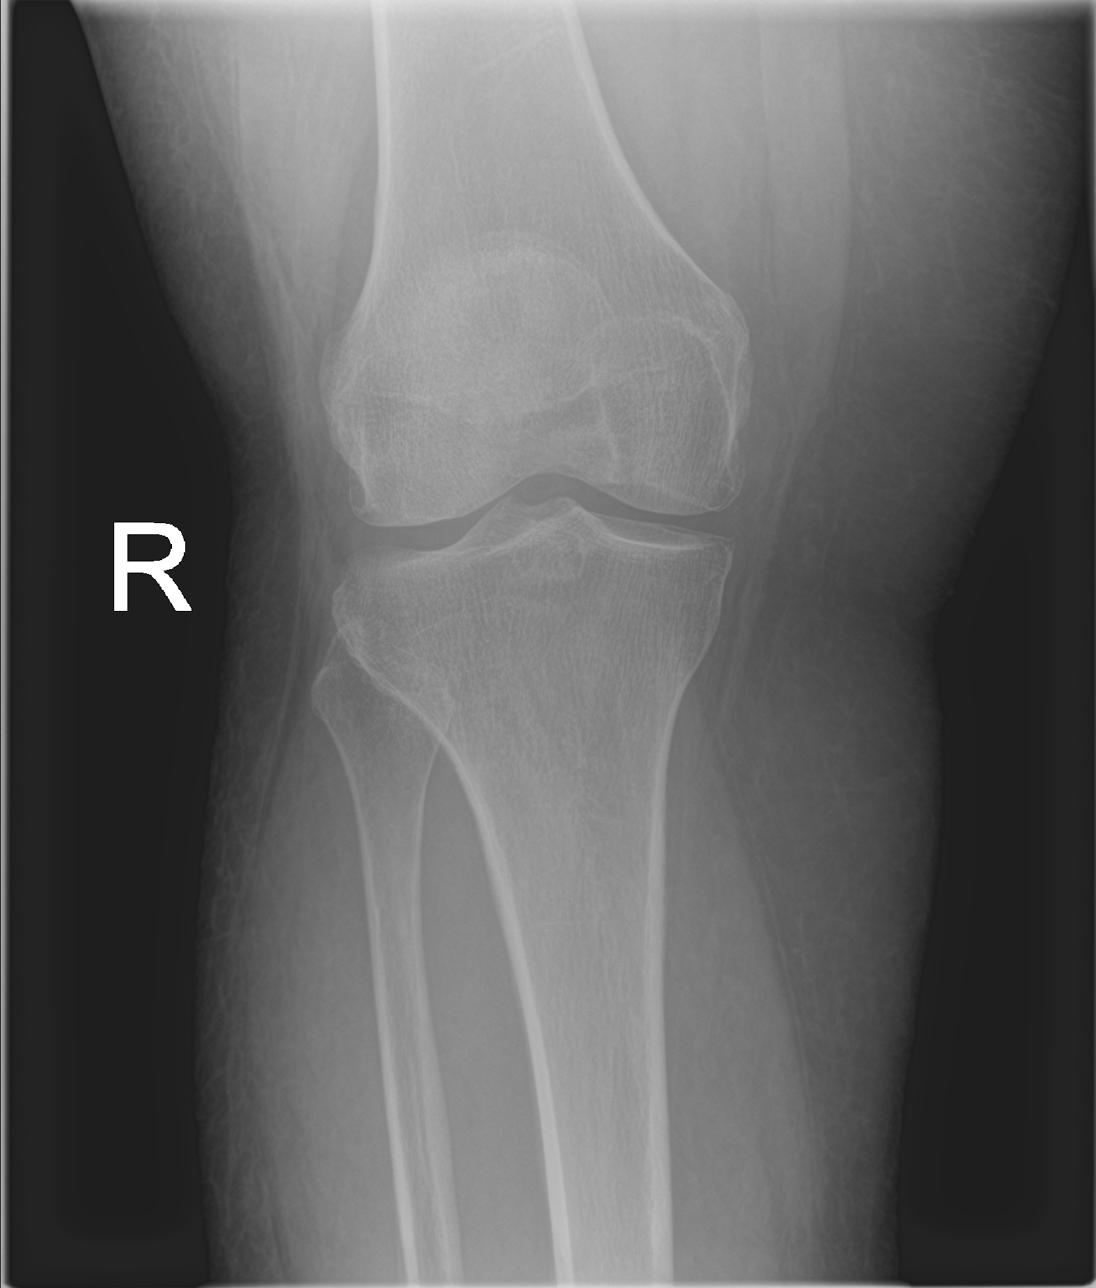

[knee lat]
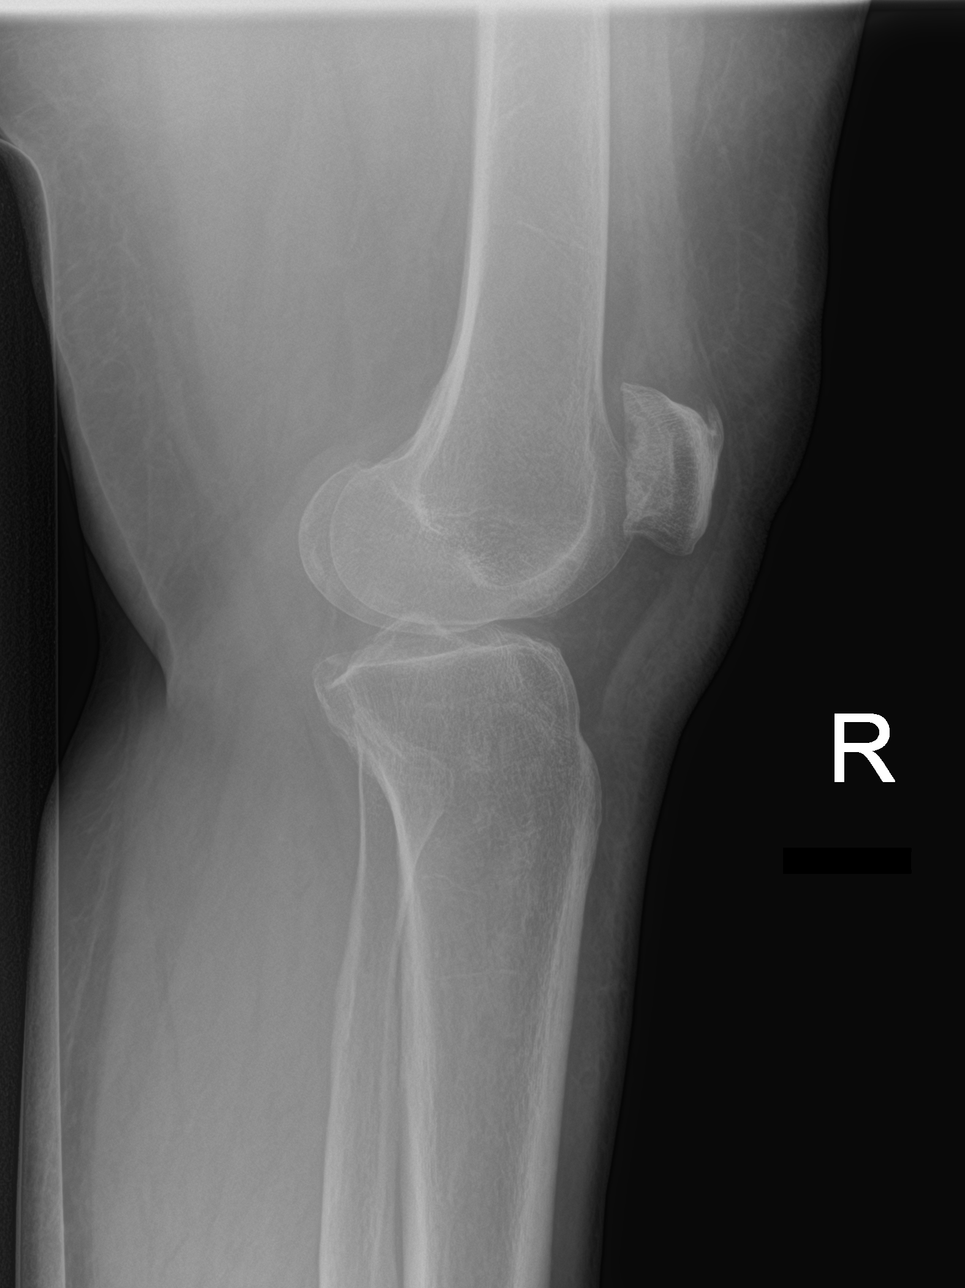

[knee obl (1 of 2)]
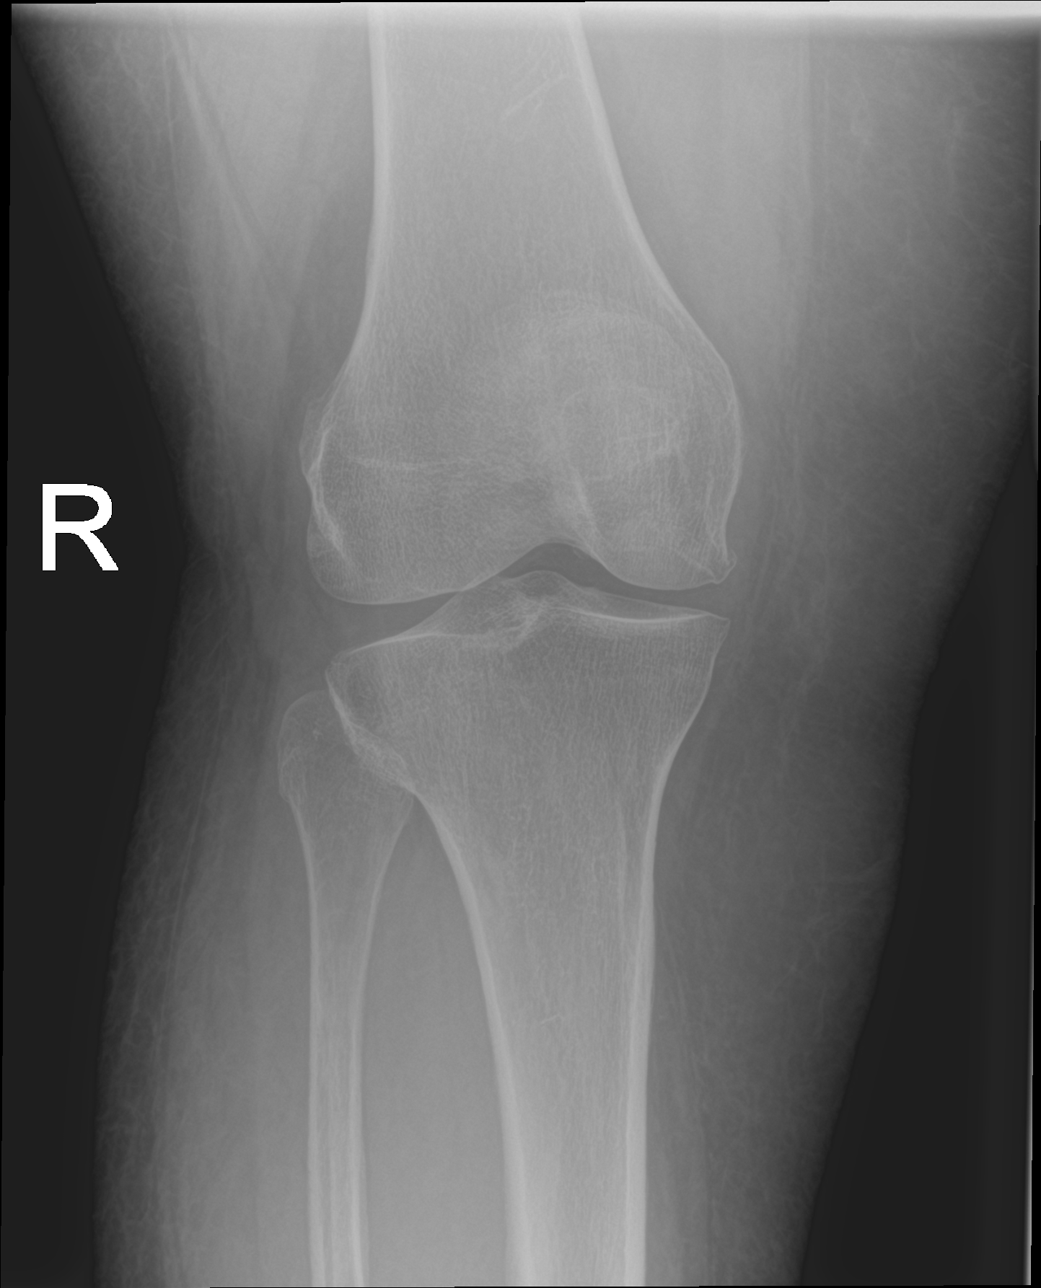

[knee obl (2 of 2)]
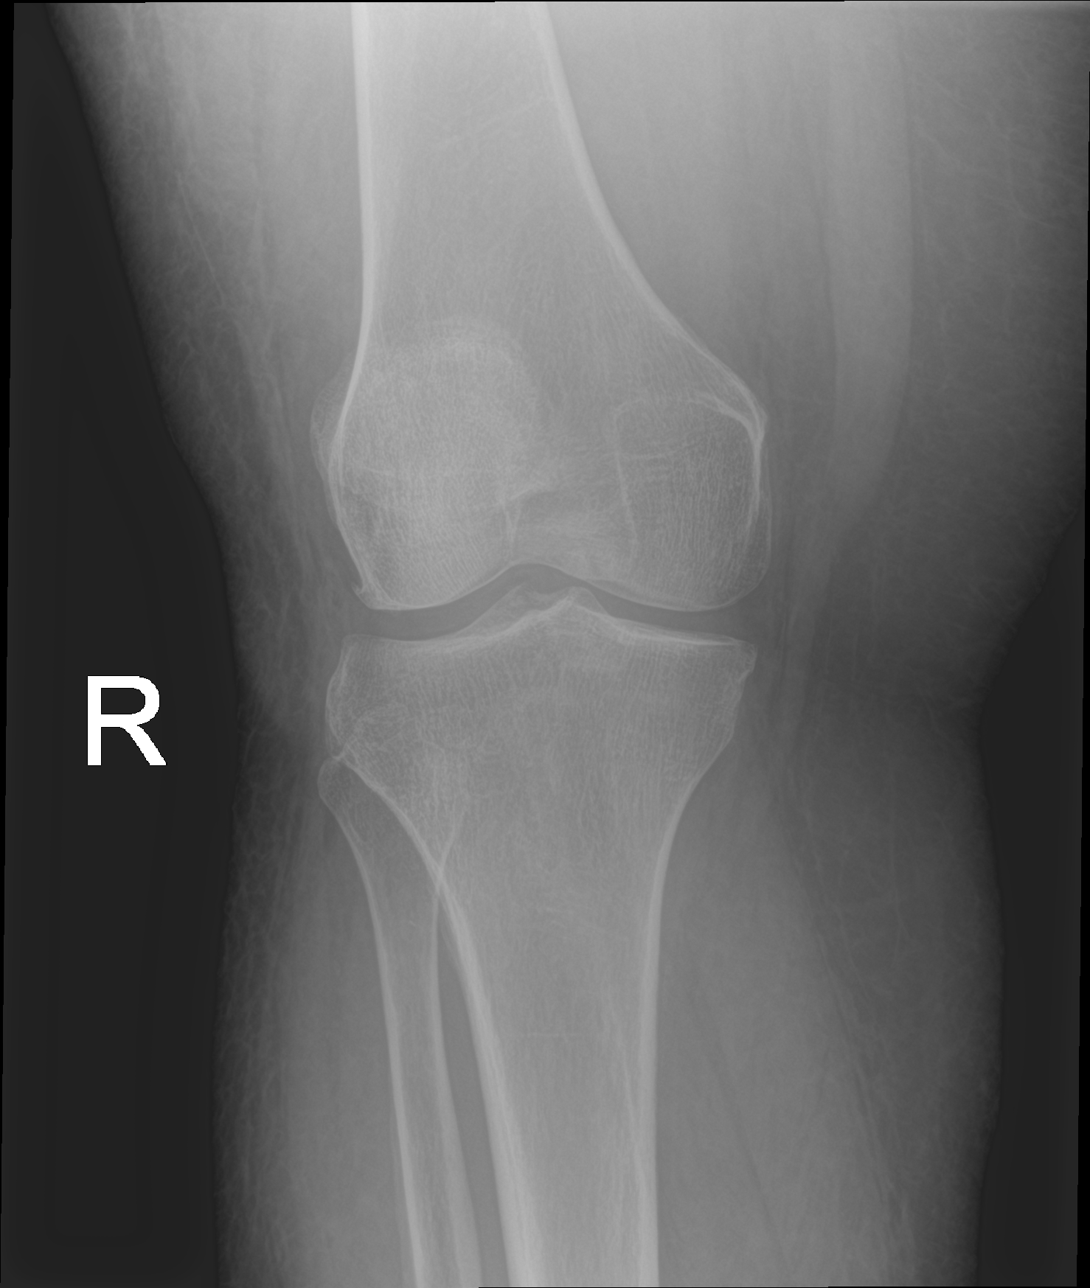

[4 of 4 positions shown; findings below may reference images not displayed]

FINDINGS: No evidence of fracture, dislocation, or joint effusion. Bone
mineralization is within normal limits for age. Mild
tricompartmental degenerative spurring. Joint spaces are preserved.
No discrete soft tissue injury.
IMPRESSION: No acute fracture or dislocation identified about the right knee.

## 2021-03-02 IMAGING — CR DG HIP (WITH OR WITHOUT PELVIS) 2-3V RIGHT
3 series · 3 of 3 positions shown · non-contrast
Comparison: None.

CLINICAL DATA: Fall out of car with right hip pain. Initial
encounter.

EXAM:
DG HIP (WITH OR WITHOUT PELVIS) 2-3V RIGHT

[hip ap]
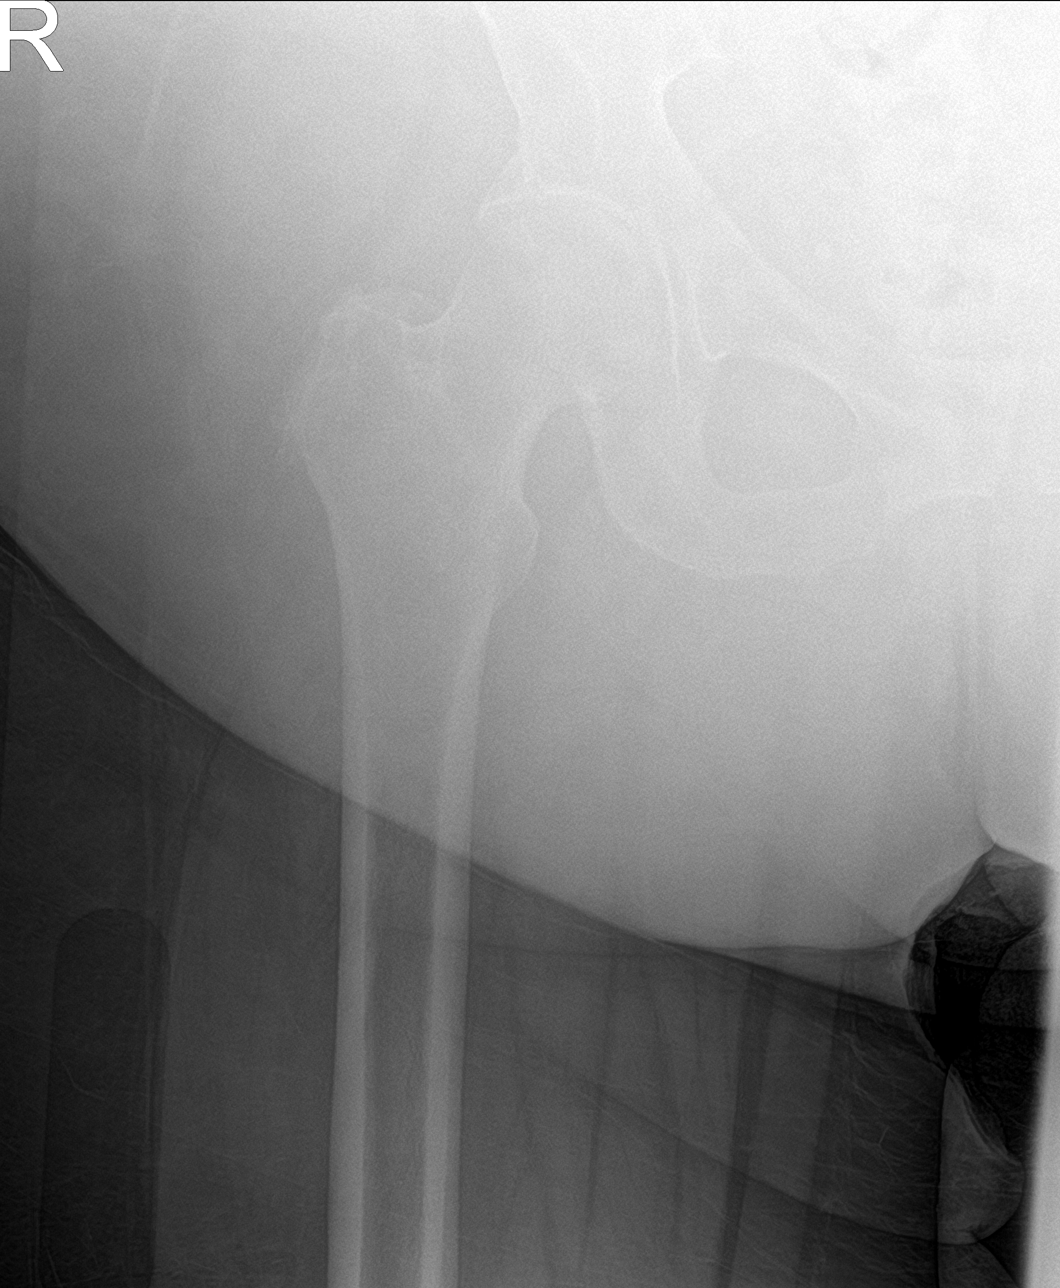

[hip lat]
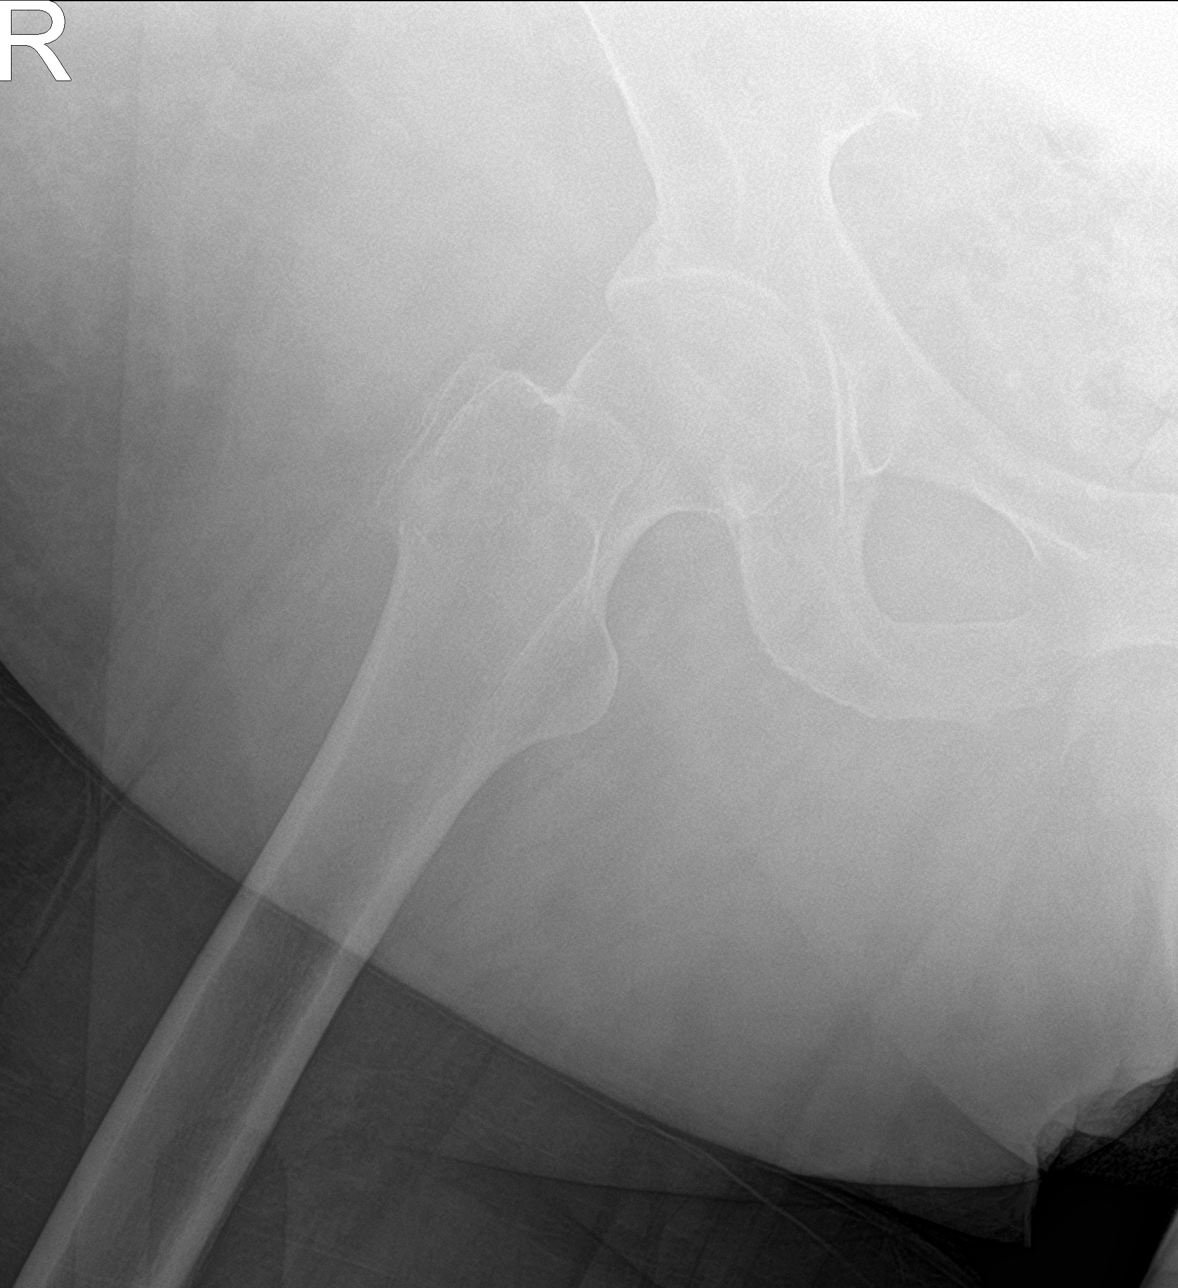

[pelvis ap]
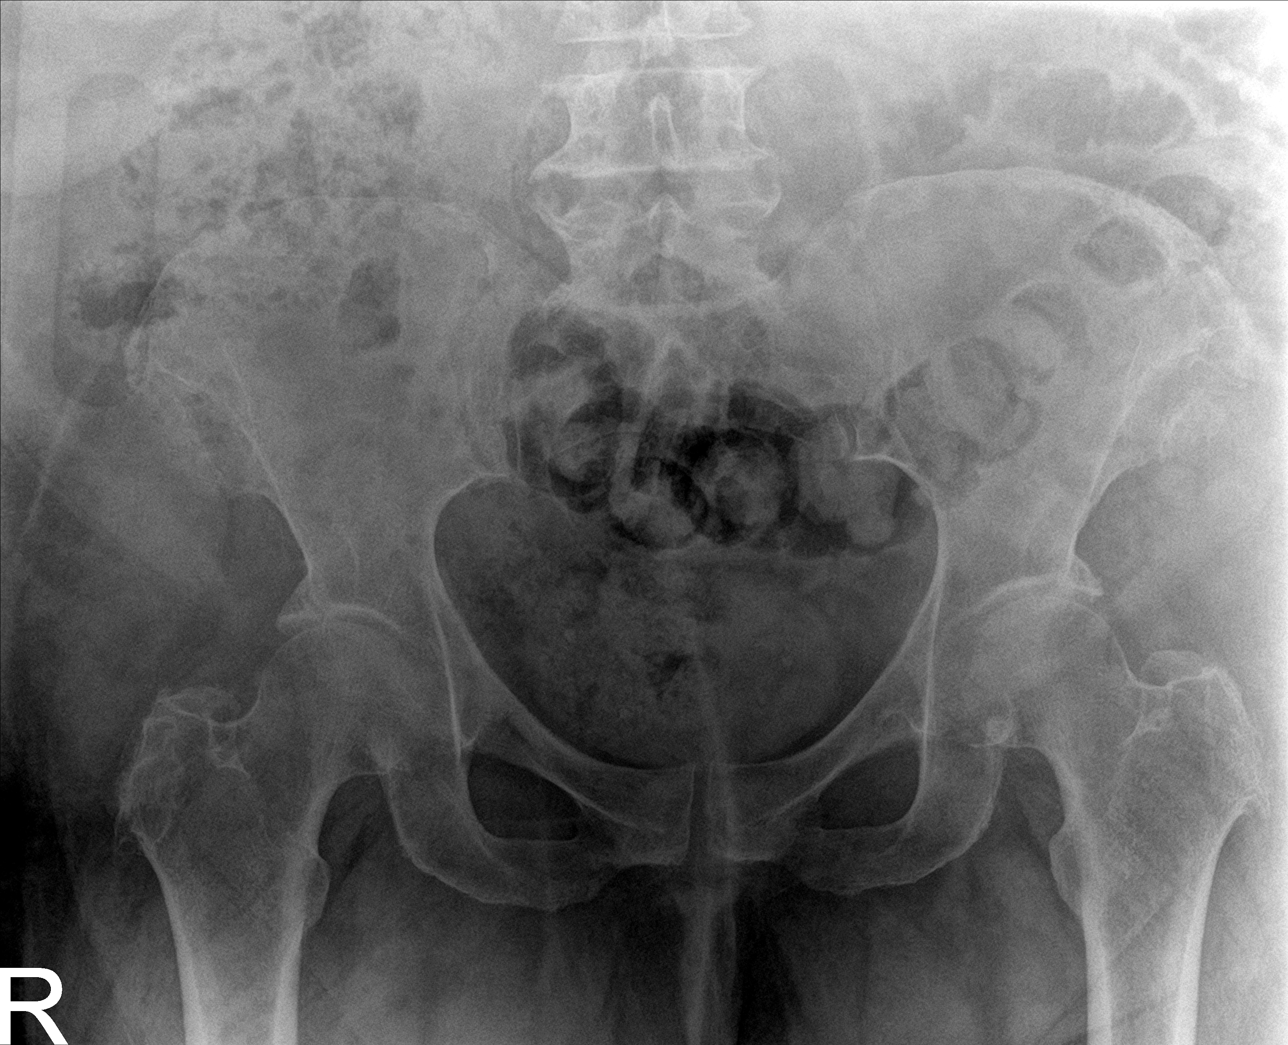

[3 of 3 positions shown; findings below may reference images not displayed]

FINDINGS: No acute fracture or dislocation identified. The bony pelvis appears
intact. Mild osteoarthritis of both hip joints.
IMPRESSION: No acute fracture identified.

## 2021-03-02 NOTE — Progress Notes (Deleted)
Electrophysiology Office Note:    Date:  03/02/2021   ID:  Michele Owens, DOB [birth year], MRN [id number]  PCP:  [name], MD  [place] Cardiologist:  None  [facility] Electrophysiologist:  Vickie Epley, MD   Referring MD: [name], MD   Chief Complaint: SVT  History of Present Illness:    Michele Owens is a 60 y.o. female who presents for an evaluation of SVT at the request of Dr. Gilford Raid. Their medical history includes pulmonary hypertension, diabetes, hypertension, DVT, thyroid cancer.  The patient presented to the hospital with SVT on January 05, 2021.  EMS was called and found the patient with a heart rate of 177 bpm.  This was treated with IV adenosine.     Past Medical History:  Diagnosis Date   Anxiety    Arthritis    Asthma    Chronic diastolic CHF (congestive heart failure) (HCC)    COPD (chronic obstructive pulmonary disease) (HCC)    Uses Oxygen at night   Depression    Diabetes mellitus without complication (HCC)    Dyspnea    occ   Family history of thyroid cancer    GERD (gastroesophageal reflux disease)    Gout    Headache    migraines   History of DVT of lower extremity    History of nuclear stress test    Myoview 2/17:  Low risk stress nuclear study with a small, moderate intensity, partially reversible inferior lateral defect consistent with small prior infarct and minimal peri-infarct ischemia; EF 68 with normal wall motion   Hypertension    Pneumonia    Thyroid cancer ([place]) 09/23/2016    Past Surgical History:  Procedure Laterality Date   BUNIONECTOMY Bilateral    CARDIAC CATHETERIZATION N/A 06/23/2015   Procedure: Right/Left Heart Cath and Coronary Angiography;  Surgeon: [name], MD;  Location: [place] [facility];  Service: Cardiovascular;  Laterality: N/A;   COLONOSCOPY WITH PROPOFOL N/A 12/15/2015   Procedure: COLONOSCOPY WITH PROPOFOL;  Surgeon: [name], MD;  Location: [place];  Service:  Endoscopy;  Laterality: N/A;   RIGHT HEART CATH N/A 10/01/2019   Procedure: RIGHT HEART CATH;  Surgeon: [name], MD;  Location: [place] [facility];  Service: Cardiovascular;  Laterality: N/A;   THYROIDECTOMY  09/19/2016   THYROIDECTOMY N/A 09/19/2016   Procedure: TOTAL THYROIDECTOMY;  Surgeon: [name], MD;  Location: [place];  Service: General;  Laterality: N/A;   TONSILLECTOMY     TOTAL ABDOMINAL HYSTERECTOMY  07/14/10    Current Medications: No outpatient medications have been marked as taking for the 03/02/21 encounter (Appointment) with Vickie Epley, MD.     Allergies:   Bee venom, Fioricet [butalbital-apap-caffeine], Ibuprofen, Lamisil [terbinafine], and Nsaids   Social History   Socioeconomic History   Marital status: Single    Spouse name: Not on file   Number of children: Not on file   Years of education: Not on file   Highest education level: Not on file  Occupational History   Occupation: unemployeed  Tobacco Use   Smoking status: Former    Packs/day: 0.50    Years: 41.00    Pack years: 20.50    Types: Cigarettes    Quit date: 07/2016    Years since quitting: 4.6   Smokeless tobacco: Never   Tobacco comments:    Starting to [employer] -- using Nicotine Patch  Vaping Use   Vaping Use: Never used  Substance and  Sexual Activity   Alcohol use: Not Currently    Alcohol/week: 0.0 standard drinks    Comment: ? none now   Drug use: Never    Comment: none for long time   Sexual activity: Yes  Other Topics Concern   Not on file  Social History Narrative   Not on file   Social Determinants of Health   Financial Resource Strain: Low Risk    Difficulty of Paying Living Expenses: Not very hard  Food Insecurity: No Food Insecurity   Worried About Charity fundraiser in the Last Year: Never true   Ran Out of Food in the Last Year: Never true  Transportation Needs: Unmet Transportation Needs   Lack of Transportation (Medical): Yes   Lack of  Transportation (Non-Medical): Yes  Physical Activity: Inactive   Days of Exercise per Week: 0 days   Minutes of Exercise per Session: 0 min  Stress: Stress Concern Present   Feeling of Stress : To some extent  Social Connections: Moderately Isolated   Frequency of Communication with Friends and Family: More than three times a week   Frequency of Social Gatherings with Friends and Family: More than three times a week   Attends Religious Services: 1 to 4 times per year   Active Member of [employer] or Organizations: No   Attends Archivist Meetings: Never   Marital Status: Divorced     Family History: The patient's family history includes Brain cancer in her paternal aunt; Cancer in her cousin, cousin, father, and maternal uncle; Diabetes in her mother; Thyroid cancer in her mother.  ROS:   Please see the history of present illness.    All other systems reviewed and are negative.  EKGs/Labs/Other Studies Reviewed:    The following studies were reviewed today:  EMS run strips   EKG:  The ekg ordered today demonstrates ***   Recent Labs: 12/24/2020: ALT 24; TSH 0.800 01/05/2021: B Natriuretic Peptide 7.5; BUN 14; Creatinine, Ser 0.72; Hemoglobin 12.0; Magnesium 1.8; Platelets 303; Potassium 3.8; Sodium 134  Recent Lipid Panel    Component Value Date/Time   CHOL 226 (H) 08/05/2017 0318   TRIG 59 08/05/2017 0318   HDL 80 08/05/2017 0318   CHOLHDL 2.8 08/05/2017 0318   VLDL 12 08/05/2017 0318   LDLCALC 134 (H) 08/05/2017 0318    Physical Exam:    VS:  There were no vitals taken for this visit.    Wt Readings from Last 3 Encounters:  01/08/21 300 lb (136.1 kg)  01/05/21 (!) 306 lb 10.6 oz (139.1 kg)  12/23/20 (!) 313 lb 0.9 oz (142 kg)     GEN: *** Well nourished, well developed in no acute distress HEENT: Normal NECK: No JVD; No carotid bruits LYMPHATICS: No lymphadenopathy CARDIAC: ***RRR, no murmurs, rubs, gallops RESPIRATORY:  Clear to auscultation without  rales, wheezing or rhonchi  ABDOMEN: Soft, non-tender, non-distended MUSCULOSKELETAL:  No edema; No deformity  SKIN: Warm and dry NEUROLOGIC:  Alert and oriented x 3 PSYCHIATRIC:  Normal affect       ASSESSMENT:    No diagnosis found. PLAN:    In order of problems listed above:         Total time spent with patient today *** minutes. This includes reviewing records, evaluating the patient and coordinating care.  Medication Adjustments/Labs and Tests Ordered: Current medicines are reviewed at length with the patient today.  Concerns regarding medicines are outlined above.  No orders of the defined types were  placed in this encounter.  No orders of the defined types were placed in this encounter.    Signed, Hilton Cork. Quentin Ore, MD, [facility], [facility] 03/02/2021 1:28 PM    Electrophysiology [facility]

## 2021-03-09 ENCOUNTER — Other Ambulatory Visit: Payer: Self-pay

## 2021-03-09 ENCOUNTER — Emergency Department (HOSPITAL_COMMUNITY): Payer: Medicaid Other

## 2021-03-09 ENCOUNTER — Encounter (HOSPITAL_COMMUNITY): Payer: Self-pay

## 2021-03-09 ENCOUNTER — Emergency Department (HOSPITAL_COMMUNITY)
Admission: EM | Admit: 2021-03-09 | Discharge: 2021-03-09 | Disposition: A | Discharge status: Home / Self Care | Payer: Medicaid Other | Attending: Emergency Medicine | Admitting: Emergency Medicine

## 2021-03-09 DIAGNOSIS — I517 Cardiomegaly: Secondary | ICD-10-CM | POA: Diagnosis not present

## 2021-03-09 DIAGNOSIS — E119 Type 2 diabetes mellitus without complications: Secondary | ICD-10-CM | POA: Insufficient documentation

## 2021-03-09 DIAGNOSIS — Z87891 Personal history of nicotine dependence: Secondary | ICD-10-CM | POA: Diagnosis not present

## 2021-03-09 DIAGNOSIS — Z7984 Long term (current) use of oral hypoglycemic drugs: Secondary | ICD-10-CM | POA: Insufficient documentation

## 2021-03-09 DIAGNOSIS — J45909 Unspecified asthma, uncomplicated: Secondary | ICD-10-CM | POA: Insufficient documentation

## 2021-03-09 DIAGNOSIS — Z79899 Other long term (current) drug therapy: Secondary | ICD-10-CM | POA: Diagnosis not present

## 2021-03-09 DIAGNOSIS — R059 Cough, unspecified: Secondary | ICD-10-CM | POA: Diagnosis not present

## 2021-03-09 DIAGNOSIS — J449 Chronic obstructive pulmonary disease, unspecified: Secondary | ICD-10-CM | POA: Insufficient documentation

## 2021-03-09 DIAGNOSIS — R0602 Shortness of breath: Secondary | ICD-10-CM | POA: Insufficient documentation

## 2021-03-09 DIAGNOSIS — Z20822 Contact with and (suspected) exposure to covid-19: Secondary | ICD-10-CM | POA: Insufficient documentation

## 2021-03-09 DIAGNOSIS — I5032 Chronic diastolic (congestive) heart failure: Secondary | ICD-10-CM | POA: Diagnosis not present

## 2021-03-09 DIAGNOSIS — J189 Pneumonia, unspecified organism: Secondary | ICD-10-CM

## 2021-03-09 DIAGNOSIS — J40 Bronchitis, not specified as acute or chronic: Secondary | ICD-10-CM

## 2021-03-09 DIAGNOSIS — R58 Hemorrhage, not elsewhere classified: Secondary | ICD-10-CM | POA: Diagnosis not present

## 2021-03-09 DIAGNOSIS — Z85850 Personal history of malignant neoplasm of thyroid: Secondary | ICD-10-CM | POA: Diagnosis not present

## 2021-03-09 DIAGNOSIS — I110 Hypertensive heart disease with heart failure: Secondary | ICD-10-CM | POA: Diagnosis not present

## 2021-03-09 DIAGNOSIS — Z7982 Long term (current) use of aspirin: Secondary | ICD-10-CM | POA: Diagnosis not present

## 2021-03-09 DIAGNOSIS — Z743 Need for continuous supervision: Secondary | ICD-10-CM | POA: Diagnosis not present

## 2021-03-09 DIAGNOSIS — K760 Fatty (change of) liver, not elsewhere classified: Secondary | ICD-10-CM | POA: Diagnosis not present

## 2021-03-09 DIAGNOSIS — R1013 Epigastric pain: Secondary | ICD-10-CM | POA: Insufficient documentation

## 2021-03-09 DIAGNOSIS — K920 Hematemesis: Secondary | ICD-10-CM | POA: Diagnosis not present

## 2021-03-09 DIAGNOSIS — R160 Hepatomegaly, not elsewhere classified: Secondary | ICD-10-CM | POA: Diagnosis not present

## 2021-03-09 DIAGNOSIS — R042 Hemoptysis: Secondary | ICD-10-CM | POA: Diagnosis not present

## 2021-03-09 DIAGNOSIS — R6889 Other general symptoms and signs: Secondary | ICD-10-CM | POA: Diagnosis not present

## 2021-03-09 LAB — COMPREHENSIVE METABOLIC PANEL
ALT: 14 U/L (ref 0–44)
AST: 15 U/L (ref 15–41)
Albumin: 4.1 g/dL (ref 3.5–5.0)
Alkaline Phosphatase: 61 U/L (ref 38–126)
Anion gap: 8 (ref 5–15)
BUN: 21 mg/dL — ABNORMAL HIGH (ref 6–20)
CO2: 36 mmol/L — ABNORMAL HIGH (ref 22–32)
Calcium: 9 mg/dL (ref 8.9–10.3)
Chloride: 94 mmol/L — ABNORMAL LOW (ref 98–111)
Creatinine, Ser: 0.78 mg/dL (ref 0.44–1.00)
GFR, Estimated: >60 mL/min (ref >60)
Glucose, Bld: 106 mg/dL — ABNORMAL HIGH (ref 70–99)
Potassium: 4.2 mmol/L (ref 3.5–5.1)
Sodium: 138 mmol/L (ref 135–145)
Total Bilirubin: 0.3 mg/dL (ref 0.3–1.2)
Total Protein: 7.8 g/dL (ref 6.5–8.1)

## 2021-03-09 LAB — BLOOD GAS, VENOUS
Acid-Base Excess: 5.2 mmol/L — ABNORMAL HIGH (ref 0.0–2.0)
Bicarbonate: 30.1 mmol/L — ABNORMAL HIGH (ref 20.0–28.0)
O2 Saturation: 97.4 %
Patient temperature: 98.6
pCO2, Ven: 48.7 mmHg (ref 44.0–60.0)
pH, Ven: 7.408 (ref 7.250–7.430)
pO2, Ven: 88.1 mmHg — ABNORMAL HIGH (ref 32.0–45.0)

## 2021-03-09 LAB — CBC
HCT: 37.4 % (ref 36.0–46.0)
Hemoglobin: 11.5 g/dL — ABNORMAL LOW (ref 12.0–15.0)
MCH: 29.4 pg (ref 26.0–34.0)
MCHC: 30.7 g/dL (ref 30.0–36.0)
MCV: 95.7 fL (ref 80.0–100.0)
Platelets: 335 10*3/uL (ref 150–400)
RBC: 3.91 MIL/uL (ref 3.87–5.11)
RDW: 13.1 % (ref 11.5–15.5)
WBC: 15.6 10*3/uL — ABNORMAL HIGH (ref 4.0–10.5)
nRBC: 0 % (ref 0.0–0.2)

## 2021-03-09 LAB — RESP PANEL BY RT-PCR (FLU A&B, COVID) ARPGX2
Influenza A by PCR: NEGATIVE
Influenza B by PCR: NEGATIVE
SARS Coronavirus 2 by RT PCR: NEGATIVE

## 2021-03-09 LAB — BRAIN NATRIURETIC PEPTIDE: B Natriuretic Peptide: 17.2 pg/mL (ref 0.0–100.0)

## 2021-03-09 LAB — TROPONIN I (HIGH SENSITIVITY): Troponin I (High Sensitivity): 4 ng/L (ref <18)

## 2021-03-09 IMAGING — DX PORTABLE CHEST - 1 VIEW
1 series · 1 of 1 positions shown · non-contrast
Comparison: 06/17/2018

CLINICAL DATA: Shortness of breath and bilateral feet swelling

EXAM:
PORTABLE CHEST 1 VIEW

[chest ap]
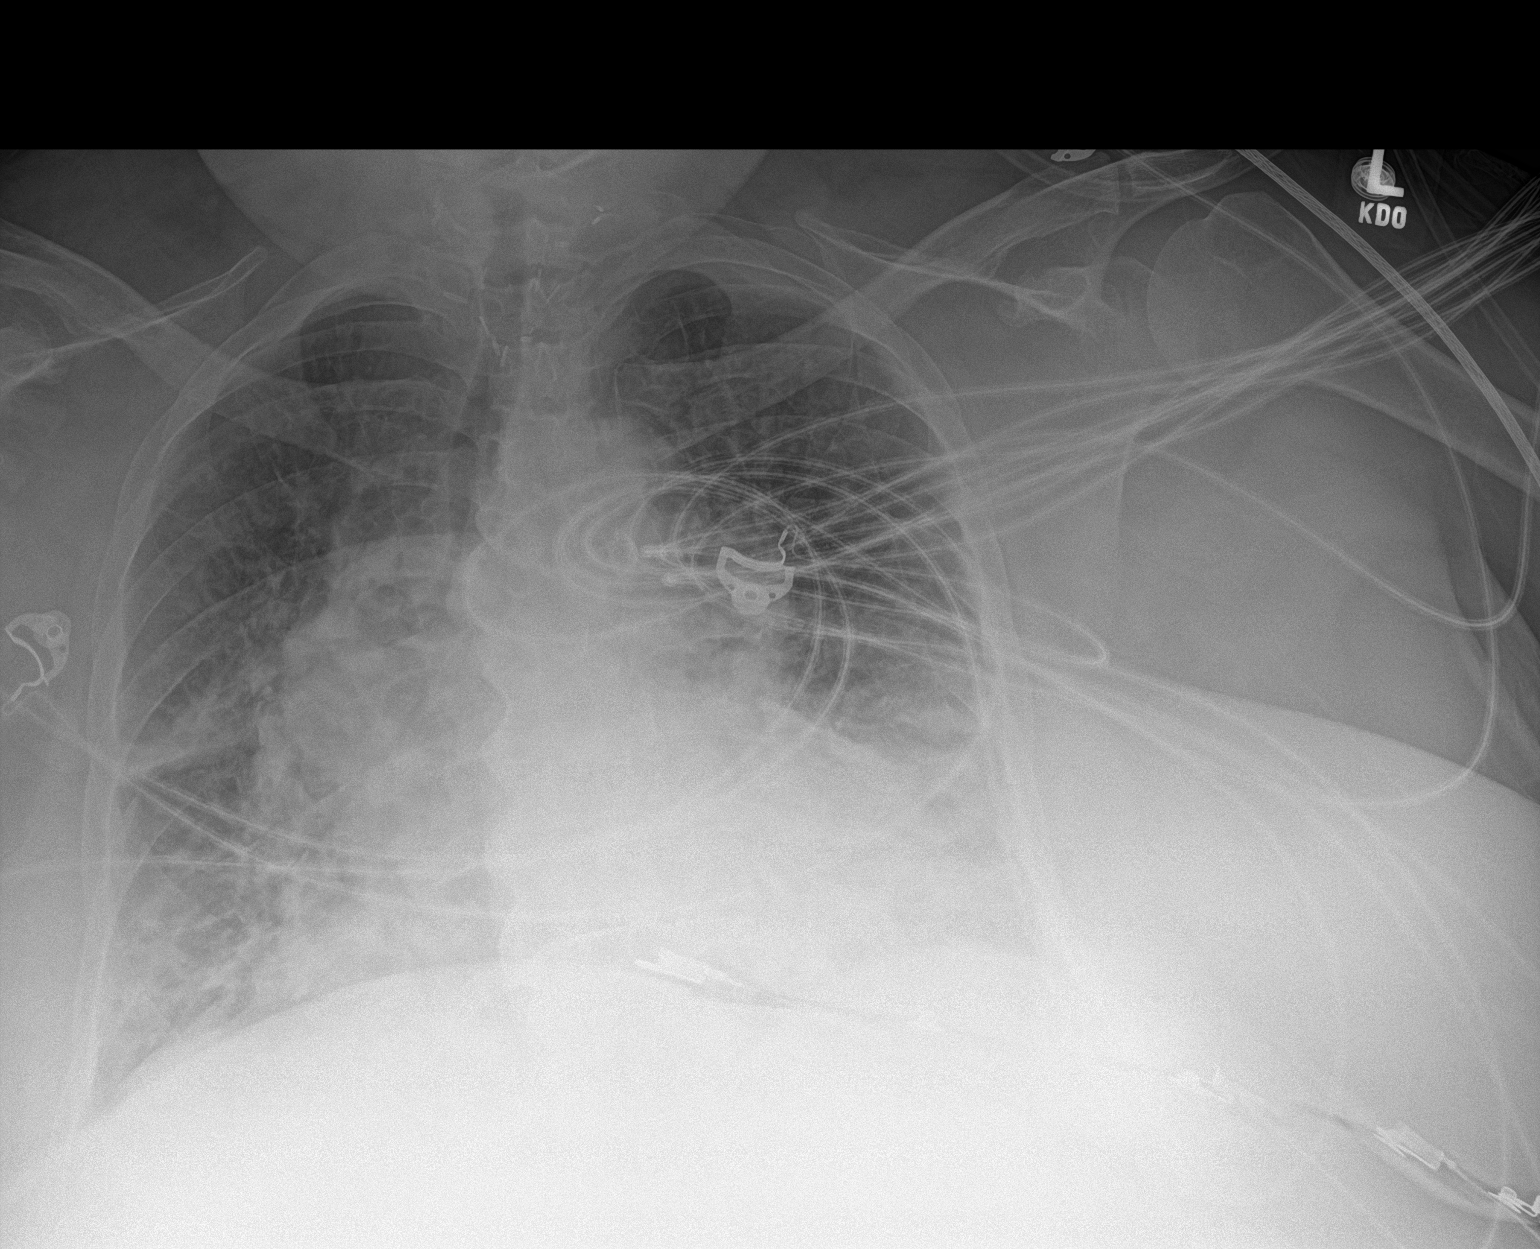

[1 of 1 positions shown; findings below may reference images not displayed]

FINDINGS: Cardiomegaly and vascular pedicle widening. Diffuse interstitial
opacity with Kerley lines and cephalized blood flow. No effusion or
pneumothorax. Postoperative thoracic inlet
IMPRESSION: CHF.

## 2021-03-09 MED ORDER — PANTOPRAZOLE SODIUM 40 MG IV SOLR
40.0000 mg | Freq: Once | INTRAVENOUS | Status: AC
  Administered 2021-03-09: 40 mg via INTRAVENOUS
  Filled 2021-03-09: qty 40

## 2021-03-09 MED ORDER — IOHEXOL 350 MG/ML SOLN
100.0000 mL | Freq: Once | INTRAVENOUS | Status: AC | PRN
  Administered 2021-03-09: 100 mL via INTRAVENOUS

## 2021-03-09 MED ORDER — SODIUM CHLORIDE 0.9 % IV SOLN
12.5000 mg | Freq: Once | INTRAVENOUS | Status: AC
  Administered 2021-03-09: 12.5 mg via INTRAVENOUS
  Filled 2021-03-09: qty 12.5

## 2021-03-09 MED ORDER — ONDANSETRON 4 MG PO TBDP: ORAL_TABLET | ORAL | 0 refills | Status: DC

## 2021-03-09 MED ORDER — AZITHROMYCIN 250 MG PO TABS: ORAL_TABLET | ORAL | 0 refills | Status: DC

## 2021-03-09 MED ORDER — MORPHINE SULFATE (PF) 4 MG/ML IV SOLN
4.0000 mg | Freq: Once | INTRAVENOUS | Status: AC
  Administered 2021-03-09: 4 mg via INTRAVENOUS
  Filled 2021-03-09: qty 1

## 2021-03-09 NOTE — ED Notes (Signed)
Pt repeatedly asking for food and ice.

## 2021-03-09 NOTE — Discharge Instructions (Addendum)
Take Z-Pak as prescribed for possible pneumonia.  Please use your oxygen as needed.  Take Zofran as needed for nausea or vomiting.  See your doctor for follow-up  You may also need to follow-up with GI doctor  I ordered home health services including aide and social work to help you at home  Return to ER if you have worsening shortness of breath, vomiting blood, abdominal pain, coughing up blood, fever

## 2021-03-09 NOTE — ED Provider Notes (Signed)
Michele Owens   CSN: [id number] Arrival date & time: 03/09/21  1352     History Chief Complaint  Patient presents with   Hematemesis   Shortness of Breath    Michele Owens is a 60 y.o. female history of CHF, COPD, here presenting with chills and hematemesis.  Patient states that for the last several days she has not been feeling well.  She states that she has some shortness of breath and also chills.  She states that she has been vomiting and then some blood in her vomit.  She has some abdominal pain as well.  She states that she is no longer on blood thinners and history of blood clots.  Patient also has worsening shortness of breath as well.  She uses oxygen as needed at baseline.   The history is provided by the patient.      Past Medical History:  Diagnosis Date   Anxiety    Arthritis    Asthma    Chronic diastolic CHF (congestive heart failure) (HCC)    COPD (chronic obstructive pulmonary disease) (HCC)    Uses Oxygen at night   Depression    Diabetes mellitus without complication (HCC)    Dyspnea    occ   Family history of thyroid cancer    GERD (gastroesophageal reflux disease)    Gout    Headache    migraines   History of DVT of lower extremity    History of nuclear stress test    Myoview 2/17:  Low risk stress nuclear study with a small, moderate intensity, partially reversible inferior lateral defect consistent with small prior infarct and minimal peri-infarct ischemia; EF 68 with normal wall motion   Hypertension    Pneumonia    Thyroid cancer ([place]) 09/23/2016    Patient Active Problem List   Diagnosis Date Noted   Respiratory failure with hypercapnia ([place]) 12/23/2020   Anemia 12/23/2020   Acute on chronic respiratory failure with hypoxia and hypercapnia ([place]) 04/18/2020   Acute on chronic respiratory failure with hypoxia ([place]) 04/18/2020   Hemoptysis 84/78/4128   Acute metabolic encephalopathy 20/81/3887    Acute encephalopathy 04/18/2020   Chronic diastolic CHF (congestive heart failure) ([place]) 04/18/2020   Sepsis ([place]) 04/18/2020   Morbid obesity with BMI of 50.0-59.9, adult ([place])    Fall at home, initial encounter 06/23/2018   HNP (herniated nucleus pulposus), thoracic 06/23/2018   CHF (congestive heart failure) ([place]) 06/18/2018   Hypertensive disorder 08/28/2017   Obesity 08/28/2017   COPD exacerbation ([place]) 08/04/2017   Acute and chronic respiratory failure with hypercapnia ([place])    Pulmonary HTN ([place])    Genetic testing 02/27/2017   Family history of thyroid cancer    Hypomagnesemia 10/03/2016   Atypical chest pain    HLD (hyperlipidemia) 09/23/2016   Gout 09/23/2016   DVT (deep vein thrombosis) in pregnancy 09/23/2016   Thyroid cancer ([place]) 09/23/2016   Hypocalcemia 09/23/2016   AKI (acute kidney injury) ([place]) 09/23/2016   Acute pulmonary edema ([place])    Acute hypoxemic respiratory failure ([place])    Ventilator dependent ([place])    Chronic respiratory failure with hypoxia and hypercapnia ([place]) 07/16/2016   CAP (community acquired pneumonia) 01/23/2016   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 01/23/2016   Pulmonary hypertension ([place]) 01/23/2016   Obesity hypoventilation syndrome ([place]) 08/26/2015   Dyslipidemia associated with type 2 diabetes mellitus ([place]) 04/24/2015   Leukocytosis 04/24/2015   Controlled type 2 diabetes mellitus without  complication, without long-term current use of insulin ([place]) 04/24/2015   Anxiety 04/24/2015   Benign essential HTN 04/24/2015   Normocytic anemia 04/24/2015   OSA (obstructive sleep apnea) 05/10/2012   Tobacco abuse 07/04/2011    Past Surgical History:  Procedure Laterality Date   BUNIONECTOMY Bilateral    CARDIAC CATHETERIZATION N/A 06/23/2015   Procedure: Right/Left Heart Cath and Coronary Angiography;  Surgeon: [name], MD;  Location: [facility];  Service: Cardiovascular;  Laterality: N/A;   COLONOSCOPY  WITH PROPOFOL N/A 12/15/2015   Procedure: COLONOSCOPY WITH PROPOFOL;  Surgeon: [name], MD;  Location: [facility];  Service: Endoscopy;  Laterality: N/A;   RIGHT HEART CATH N/A 10/01/2019   Procedure: RIGHT HEART CATH;  Surgeon: [name], MD;  Location: [facility];  Service: Cardiovascular;  Laterality: N/A;   THYROIDECTOMY  09/19/2016   THYROIDECTOMY N/A 09/19/2016   Procedure: TOTAL THYROIDECTOMY;  Surgeon: [name], MD;  Location: [facility];  Service: General;  Laterality: N/A;   TONSILLECTOMY     TOTAL ABDOMINAL HYSTERECTOMY  07/14/10     OB History   No obstetric history on file.     Family History  Problem Relation Age of Onset   Cancer Father        thought to be due to exposure to concrete   Diabetes Mother    Thyroid cancer Mother        dx in her 68s-60s   Cancer Maternal Uncle        2 uncles with cancer NOS   Brain cancer Paternal Aunt    Cancer Cousin        maternal first cousin - NOS   Cancer Cousin        maternal first cousin - NOS    Social History   Tobacco Use   Smoking status: Former    Packs/day: 0.50    Years: 41.00    Pack years: 20.50    Types: Cigarettes    Quit date: 07/2016    Years since quitting: 4.6   Smokeless tobacco: Never   Tobacco comments:    Starting to [employer] -- using Nicotine Patch  Vaping Use   Vaping Use: Never used  Substance Use Topics   Alcohol use: Not Currently    Alcohol/week: 0.0 standard drinks    Comment: ? none now   Drug use: Never    Comment: none for long time    Home Medications Prior to Admission medications   Medication Sig Start Date End Date Taking? Authorizing Provider  azithromycin (ZITHROMAX Z-PAK) 250 MG tablet 2 po day one, then 1 daily x 4 days 03/09/21  Yes Drenda Freeze, MD  albuterol (PROVENTIL HFA;VENTOLIN HFA) 108 (90 Base) MCG/ACT inhaler Inhale 1-2 puffs into the lungs every 6 (six) hours as needed for wheezing or shortness of breath. 04/23/18   Long, [name], MD   allopurinol (ZYLOPRIM) 100 MG tablet Take 100 mg by mouth daily.    [provider]  aspirin EC 81 MG tablet Take 81 mg by mouth daily. Swallow whole.    [provider]  benzonatate (TESSALON) 200 MG capsule Take 1 capsule (200 mg total) by mouth in the morning, at noon, and at bedtime. 7 DS Patient taking differently: Take 200 mg by mouth 3 (three) times daily as needed for cough. 04/13/20   Regalado, Belkys A, MD  blood glucose meter kit and supplies KIT Dispense based on patient and insurance  preference. Use up to four times daily as directed. 12/25/20   Little Ishikawa, MD  busPIRone (BUSPAR) 5 MG tablet Take 5 mg by mouth 3 (three) times daily as needed (anxiety).    [provider]  Butalbital-APAP-Caffeine 50-300-40 MG CAPS Take 1 capsule by mouth 2 (two) times daily as needed for headache.    [provider]  calcitRIOL (ROCALTROL) 0.5 MCG capsule Take 1 capsule (0.5 mcg total) by mouth daily. 09/26/16   Hongalgi, [name], MD  carvedilol (COREG) 6.25 MG tablet Take 1 tablet (6.25 mg total) by mouth 2 (two) times daily with a meal. 01/23/18   Bensimhon, [name], MD  clonazePAM (KLONOPIN) 2 MG tablet Take 1 tablet (2 mg total) by mouth 3 (three) times daily. for anxiety Patient taking differently: Take 2 mg by mouth 3 (three) times daily. 06/23/19   [name], MD  cyclobenzaprine (FLEXERIL) 10 MG tablet Take 10 mg by mouth 3 (three) times daily. 11/19/19   [provider]  diclofenac sodium (VOLTAREN) 1 % GEL Apply 2 g topically 4 (four) times daily as needed (for pain). 06/19/18   [name], MD  EPINEPHrine 0.3 mg/0.3 mL IJ SOAJ injection Inject 0.3 mg into the muscle once as needed for anaphylaxis.    [provider]  FEROSUL 325 (65 Fe) MG tablet Take 325 mg by mouth daily. 03/22/17   [provider]  glucose blood test strip Use as instructed to check blood sugar up to 4 times daily Patient taking differently: 1  each by Other route See admin instructions. Use as instructed to check blood sugar up to 4 times daily 08/06/17   [name], DO  ipratropium-albuterol (DUONEB) 0.5-2.5 (3) MG/3ML SOLN Take 3 mLs by nebulization 4 (four) times daily.  04/05/17   [provider]  levothyroxine (SYNTHROID, LEVOTHROID) 112 MCG tablet Take 224 mcg by mouth daily before breakfast. 07/11/17   [provider]  lisinopril (PRINIVIL,ZESTRIL) 5 MG tablet Take 1 tablet (5 mg total) by mouth daily. Please call for office visit [phone] Patient taking differently: Take 5 mg by mouth daily. 08/15/17   [name], MD  metFORMIN (GLUCOPHAGE) 500 MG tablet Take 1 tablet (500 mg total) by mouth 2 (two) times daily with a meal. 12/11/14   [name], MD  nitroGLYCERIN (NITROSTAT) 0.4 MG SL tablet Place 1 tablet (0.4 mg total) under the tongue every 5 (five) minutes as needed for chest pain. 06/19/18   [name], MD  oxyCODONE-acetaminophen (PERCOCET) 10-325 MG tablet Take 1 tablet by mouth every 6 (six) hours as needed for pain. 12/03/19   [provider]  OXYGEN Inhale 4 L into the lungs continuous.    [provider]  potassium chloride SA (K-DUR,KLOR-CON) 20 MEQ tablet Take 1 tablet (20 mEq total) by mouth 2 (two) times daily. 07/24/18   [name], MD  predniSONE (DELTASONE) 20 MG tablet Take 2 tablets (40 mg total) by mouth daily. Patient not taking: No sig reported 11/11/20   Horton, Barbette Hair, MD  rosuvastatin (CRESTOR) 10 MG tablet Take 10 mg by mouth every evening. 11/19/19   [provider]  senna-docusate (SENOKOT-S) 8.6-50 MG tablet Take 1 tablet by mouth daily.    [provider]  torsemide (DEMADEX) 20 MG tablet Take 2 tablets (40 mg total) by mouth 2 (two) times daily. 12/17/20   [name], MD  traZODone (DESYREL) 50 MG tablet Take 100 mg by mouth at  bedtime. 11/19/19   [provider]  triamcinolone cream (KENALOG) 0.1 % Apply 1  application topically 2 (two) times daily. 09/10/20   [provider]  umeclidinium-vilanterol (ANORO ELLIPTA) 62.5-25 MCG/INH AEPB Inhale 1 puff into the lungs daily.    [provider]    Allergies    Bee venom, Fioricet [butalbital-apap-caffeine], Ibuprofen, Lamisil [terbinafine], and Nsaids  Review of Systems   Review of Systems  Respiratory:  Positive for shortness of breath.   Gastrointestinal:  Positive for abdominal pain.  All other systems reviewed and are negative.  Physical Exam Updated Vital Signs BP 122/78   Pulse 81   Temp 98.6 F (37 C) (Oral)   Resp 14   Ht '5\' 5"'  (1.651 m)   SpO2 99%   BMI 49.92 kg/m   Physical Exam Vitals and nursing Owens reviewed.  HENT:     Head: Normocephalic.     Mouth/Throat:     Mouth: Mucous membranes are moist.  Eyes:     Extraocular Movements: Extraocular movements intact.     Pupils: Pupils are equal, round, and reactive to light.  Cardiovascular:     Rate and Rhythm: Normal rate and regular rhythm.  Pulmonary:     Comments: Slightly tachypneic, diminished bilaterally  Abdominal:     General: Bowel sounds are normal.     Palpations: Abdomen is soft.     Comments: Epigastric tenderness   Musculoskeletal:        General: Normal range of motion.     Cervical back: Normal range of motion and neck supple.  Skin:    General: Skin is warm.     Capillary Refill: Capillary refill takes less than 2 seconds.  Neurological:     General: No focal deficit present.     Mental Status: She is alert and oriented to person, place, and time.  Psychiatric:        Mood and Affect: Mood normal.        Behavior: Behavior normal.    ED Results / Procedures / Treatments   Labs (all labs ordered are listed, but only abnormal results are displayed) Labs Reviewed  CBC - Abnormal; Notable for the following components:      Result Value   WBC 15.6 (*)    Hemoglobin 11.5 (*)    All other components within normal limits   COMPREHENSIVE METABOLIC PANEL - Abnormal; Notable for the following components:   Chloride 94 (*)    CO2 36 (*)    Glucose, Bld 106 (*)    BUN 21 (*)    All other components within normal limits  BLOOD GAS, VENOUS - Abnormal; Notable for the following components:   pO2, Ven 88.1 (*)    Bicarbonate 30.1 (*)    Acid-Base Excess 5.2 (*)    All other components within normal limits  RESP PANEL BY RT-PCR (FLU A&B, COVID) ARPGX2  BRAIN NATRIURETIC PEPTIDE  TROPONIN I (HIGH SENSITIVITY)    EKG None  Radiology DG Chest 2 View  Result Date: 03/09/2021 CLINICAL DATA:  Shortness of breath. Coughing up blood intermittently for months. EXAM: CHEST - 2 VIEW COMPARISON:  Radiographs 01/05/2021 and additional priors. CT 04/18/2020. FINDINGS: The heart size and mediastinal contours are stable. Surgical clips at the thoracic inlet consistent with previous thyroidectomy. Stable bilateral hilar prominence, central vascular congestion and bibasilar airspace opacities. No significant pleural effusion or pneumothorax. Degenerative changes are present throughout the spine. IMPRESSION: Similar appearance of the chest compared with recent  priors with streaky bibasilar pulmonary opacities which likely represent atelectasis or scarring. No definite acute process. Electronically Signed   By: Richardean Sale M.D.   On: 03/09/2021 14:40   CT Angio Chest PE W and/or Wo Contrast  Result Date: 03/09/2021 CLINICAL DATA:  Distension coughing up blood shortness of breath EXAM: CT ANGIOGRAPHY CHEST CT ABDOMEN AND PELVIS WITH CONTRAST TECHNIQUE: Multidetector CT imaging of the chest was performed using the standard protocol during bolus administration of intravenous contrast. Multiplanar CT image reconstructions and MIPs were obtained to evaluate the vascular anatomy. Multidetector CT imaging of the abdomen and pelvis was performed using the standard protocol during bolus administration of intravenous contrast. CONTRAST:   140m OMNIPAQUE IOHEXOL 350 MG/ML SOLN COMPARISON:  Chest x-ray 03/09/2021, CT abdomen pelvis 10/01/2020, CT chest 04/18/2020 FINDINGS: CTA CHEST FINDINGS Cardiovascular: Satisfactory opacification of the pulmonary arteries to the segmental level. Respiratory motion degradation limits assessment for distal PE. No definite acute central embolus is seen. Mild aortic atherosclerosis. No aneurysm. Mild cardiomegaly. No pericardial effusion Mediastinum/Nodes: Midline trachea. No thyroid mass. No suspicious adenopathy. Esophagus within normal limits. Lungs/Pleura: No pleural effusion or pneumothorax. Bandlike density in the left lower lobe most likely represents atelectasis. Small focus of subpleural consolidation within the left lower lobe. Additional small subpleural consolidation in the right lower lobe with patchy consolidation and ground-glass density in the right upper lobe and perihilar region suspicious for pneumonia. Musculoskeletal: Sternum is intact.  No acute osseous abnormality. Review of the MIP images confirms the above findings. CT ABDOMEN and PELVIS FINDINGS Hepatobiliary: No calcified gallstone or biliary dilatation. Liver enlargement up to 22 cm. Hepatic steatosis. Pancreas: Unremarkable. No pancreatic ductal dilatation or surrounding inflammatory changes. Spleen: Normal in size without focal abnormality. Adrenals/Urinary Tract: Adrenal glands are unremarkable. Kidneys are normal, without renal calculi, focal lesion, or hydronephrosis. Bladder is unremarkable. Stomach/Bowel: Stomach is within normal limits. Appendix appears normal. No evidence of bowel wall thickening, distention, or inflammatory changes. Vascular/Lymphatic: Mild aortic atherosclerosis. No aneurysm. No suspicious nodes Reproductive: Status post hysterectomy. No adnexal masses. Other: Negative for pelvic effusion or free air Musculoskeletal: No acute or significant osseous findings. Review of the MIP images confirms the above findings.  IMPRESSION: 1. Motion degradation limits assessment for subsegmental PE. No definite acute pulmonary embolus is seen. 2. Patchy subpleural, right perihilar and right upper lobe consolidations and ground-glass densities suspect for pneumonia, consider atypical or potential viral pneumonia. 3. No CT evidence for acute intra-abdominal or pelvic abnormality. 4. Enlarged liver with steatosis. Electronically Signed   By: KDonavan FoilM.D.   On: 03/09/2021 18:23   CT ABDOMEN PELVIS W CONTRAST  Result Date: 03/09/2021 CLINICAL DATA:  Distension coughing up blood shortness of breath EXAM: CT ANGIOGRAPHY CHEST CT ABDOMEN AND PELVIS WITH CONTRAST TECHNIQUE: Multidetector CT imaging of the chest was performed using the standard protocol during bolus administration of intravenous contrast. Multiplanar CT image reconstructions and MIPs were obtained to evaluate the vascular anatomy. Multidetector CT imaging of the abdomen and pelvis was performed using the standard protocol during bolus administration of intravenous contrast. CONTRAST:  1035mOMNIPAQUE IOHEXOL 350 MG/ML SOLN COMPARISON:  Chest x-ray 03/09/2021, CT abdomen pelvis 10/01/2020, CT chest 04/18/2020 FINDINGS: CTA CHEST FINDINGS Cardiovascular: Satisfactory opacification of the pulmonary arteries to the segmental level. Respiratory motion degradation limits assessment for distal PE. No definite acute central embolus is seen. Mild aortic atherosclerosis. No aneurysm. Mild cardiomegaly. No pericardial effusion Mediastinum/Nodes: Midline trachea. No thyroid mass. No suspicious adenopathy. Esophagus within normal limits.  Lungs/Pleura: No pleural effusion or pneumothorax. Bandlike density in the left lower lobe most likely represents atelectasis. Small focus of subpleural consolidation within the left lower lobe. Additional small subpleural consolidation in the right lower lobe with patchy consolidation and ground-glass density in the right upper lobe and perihilar  region suspicious for pneumonia. Musculoskeletal: Sternum is intact.  No acute osseous abnormality. Review of the MIP images confirms the above findings. CT ABDOMEN and PELVIS FINDINGS Hepatobiliary: No calcified gallstone or biliary dilatation. Liver enlargement up to 22 cm. Hepatic steatosis. Pancreas: Unremarkable. No pancreatic ductal dilatation or surrounding inflammatory changes. Spleen: Normal in size without focal abnormality. Adrenals/Urinary Tract: Adrenal glands are unremarkable. Kidneys are normal, without renal calculi, focal lesion, or hydronephrosis. Bladder is unremarkable. Stomach/Bowel: Stomach is within normal limits. Appendix appears normal. No evidence of bowel wall thickening, distention, or inflammatory changes. Vascular/Lymphatic: Mild aortic atherosclerosis. No aneurysm. No suspicious nodes Reproductive: Status post hysterectomy. No adnexal masses. Other: Negative for pelvic effusion or free air Musculoskeletal: No acute or significant osseous findings. Review of the MIP images confirms the above findings. IMPRESSION: 1. Motion degradation limits assessment for subsegmental PE. No definite acute pulmonary embolus is seen. 2. Patchy subpleural, right perihilar and right upper lobe consolidations and ground-glass densities suspect for pneumonia, consider atypical or potential viral pneumonia. 3. No CT evidence for acute intra-abdominal or pelvic abnormality. 4. Enlarged liver with steatosis. Electronically Signed   By: Donavan Foil M.D.   On: 03/09/2021 18:23    Procedures Procedures   Medications Ordered in ED Medications  pantoprazole (PROTONIX) injection 40 mg (40 mg Intravenous Given 03/09/21 1653)  promethazine (PHENERGAN) 12.5 mg in sodium chloride 0.9 % 50 mL IVPB (0 mg Intravenous Stopped 03/09/21 1833)  morphine 4 MG/ML injection 4 mg (4 mg Intravenous Given 03/09/21 1653)  iohexol (OMNIPAQUE) 350 MG/ML injection 100 mL (100 mLs Intravenous Contrast Given 03/09/21 1738)     ED Course  I have reviewed the triage vital signs and the nursing notes.  Pertinent labs & imaging results that were available during my care of the patient were reviewed by me and considered in my medical decision making (see chart for details).    MDM Rules/Calculators/A&P                           Edit Michele Owens is a 60 y.o. female here with SOB, hematemesis vs hemoptysis, ab pain.  Consider COPD versus CHF versus PE.  Can also be bleeding from stomach ulcer.  Plan to get CBC and CMP and BNP and CTA chest and CT abdomen pelvis.   6:58 PM Hemoglobin is stable.  BNP is normal.  CT showed possible atypical pneumonia.  Patient is doing well on her baseline oxygen.  Patient is already on omeprazole and tolerated p.o. we will have her follow-up with GI outpatient.  Patient states that she needs some home care so I had social work talk to her.  Stable for discharge.  Will discharge home with azithromycin.  Patient's COVID test is negative   Final Clinical Impression(s) / ED Diagnoses Final diagnoses:  None    Rx / DC Orders ED Discharge Orders          Ordered    azithromycin (ZITHROMAX Z-PAK) 250 MG tablet        03/09/21 [place]        03/09/21 1836    Face-to-face encounter (required for Medicare/Medicaid patients)  Comments: I Wandra Arthurs certify that this patient is under my care and that I, or a nurse practitioner or physician's assistant working with me, had a face-to-face encounter that meets the physician face-to-face encounter requirements with this patient on 03/09/2021. The encounter with the patient was in whole, or in part for the following medical condition(s) which is the primary reason for home health care (List medical condition): COPD, further orders from PCP   03/09/21 1836             Drenda Freeze, MD 03/09/21 1902

## 2021-03-09 NOTE — ED Triage Notes (Signed)
Pt [facility] from home. Pt has been coughing up blood off and on for months. EMS reports there was no coughing with them. Pt c/o SOB with exertion. Pt states she has been having trouble keeping food down with vomiting. Pt has hx of COPD and CHF. Pt has not been seeing any doctors recently for her COPD and CHF. Pt on 4L [place] at baseline home. Pt is requesting assistance getting set up with home health.   126/88 88 HR 98% on 4L [place] 18 Resp  CBG 114

## 2021-03-09 NOTE — Clinical Note (Incomplete)
ED  RNCM spoke with patient regarding her concerns about needing a Education officer, museum to com

## 2021-03-09 NOTE — ED Provider Notes (Signed)
Emergency Medicine Provider Triage Evaluation Note  Michele Owens , a 60 y.o. female  was evaluated in triage.  Pt with a past medical history of congestive heart failure, COPD, diabetes, hypertension presenting with a complaint of shortness of breath and cough for "many months."  Reports being on 3 L of oxygen at baseline at home.  Has not followed up with his chronic conditions in a while.  Also feels that she has lost her sense of taste.  Review of Systems  Positive: As above Negative: Syncope  Physical Exam  BP 128/74 (BP Location: Left Arm)   Pulse 85   Temp 98.6 F (37 C) (Oral)   Resp 18   Ht 5\' 5"  (1.651 m)   SpO2 96%   BMI 49.92 kg/m  Gen:   Awake, no distress   Resp:  Normal effort  MSK:   Moves extremities without difficulty  Other:  RRR, wheezing  Medical Decision Making  Medically screening exam initiated at 2:17 PM.  Appropriate orders placed.  Jevaeh Shams was informed that the remainder of the evaluation will be completed by another provider, this initial triage assessment does not replace that evaluation, and the importance of remaining in the ED until their evaluation is complete.    Ill-appearing.  Poor chronic condition management.   Rhae Hammock, PA-C 03/09/21 1420    Milton Ferguson, MD 03/09/21 [phone]

## 2021-03-10 ENCOUNTER — Other Ambulatory Visit: Payer: Self-pay | Admitting: Obstetrics and Gynecology

## 2021-03-10 IMAGING — CR CHEST - 2 VIEW
2 series · 2 of 2 positions shown · non-contrast
Comparison: July 04, 2018

CLINICAL DATA: Shortness of breath.  History of thyroid carcinoma

EXAM:
CHEST - 2 VIEW

[chest pa]
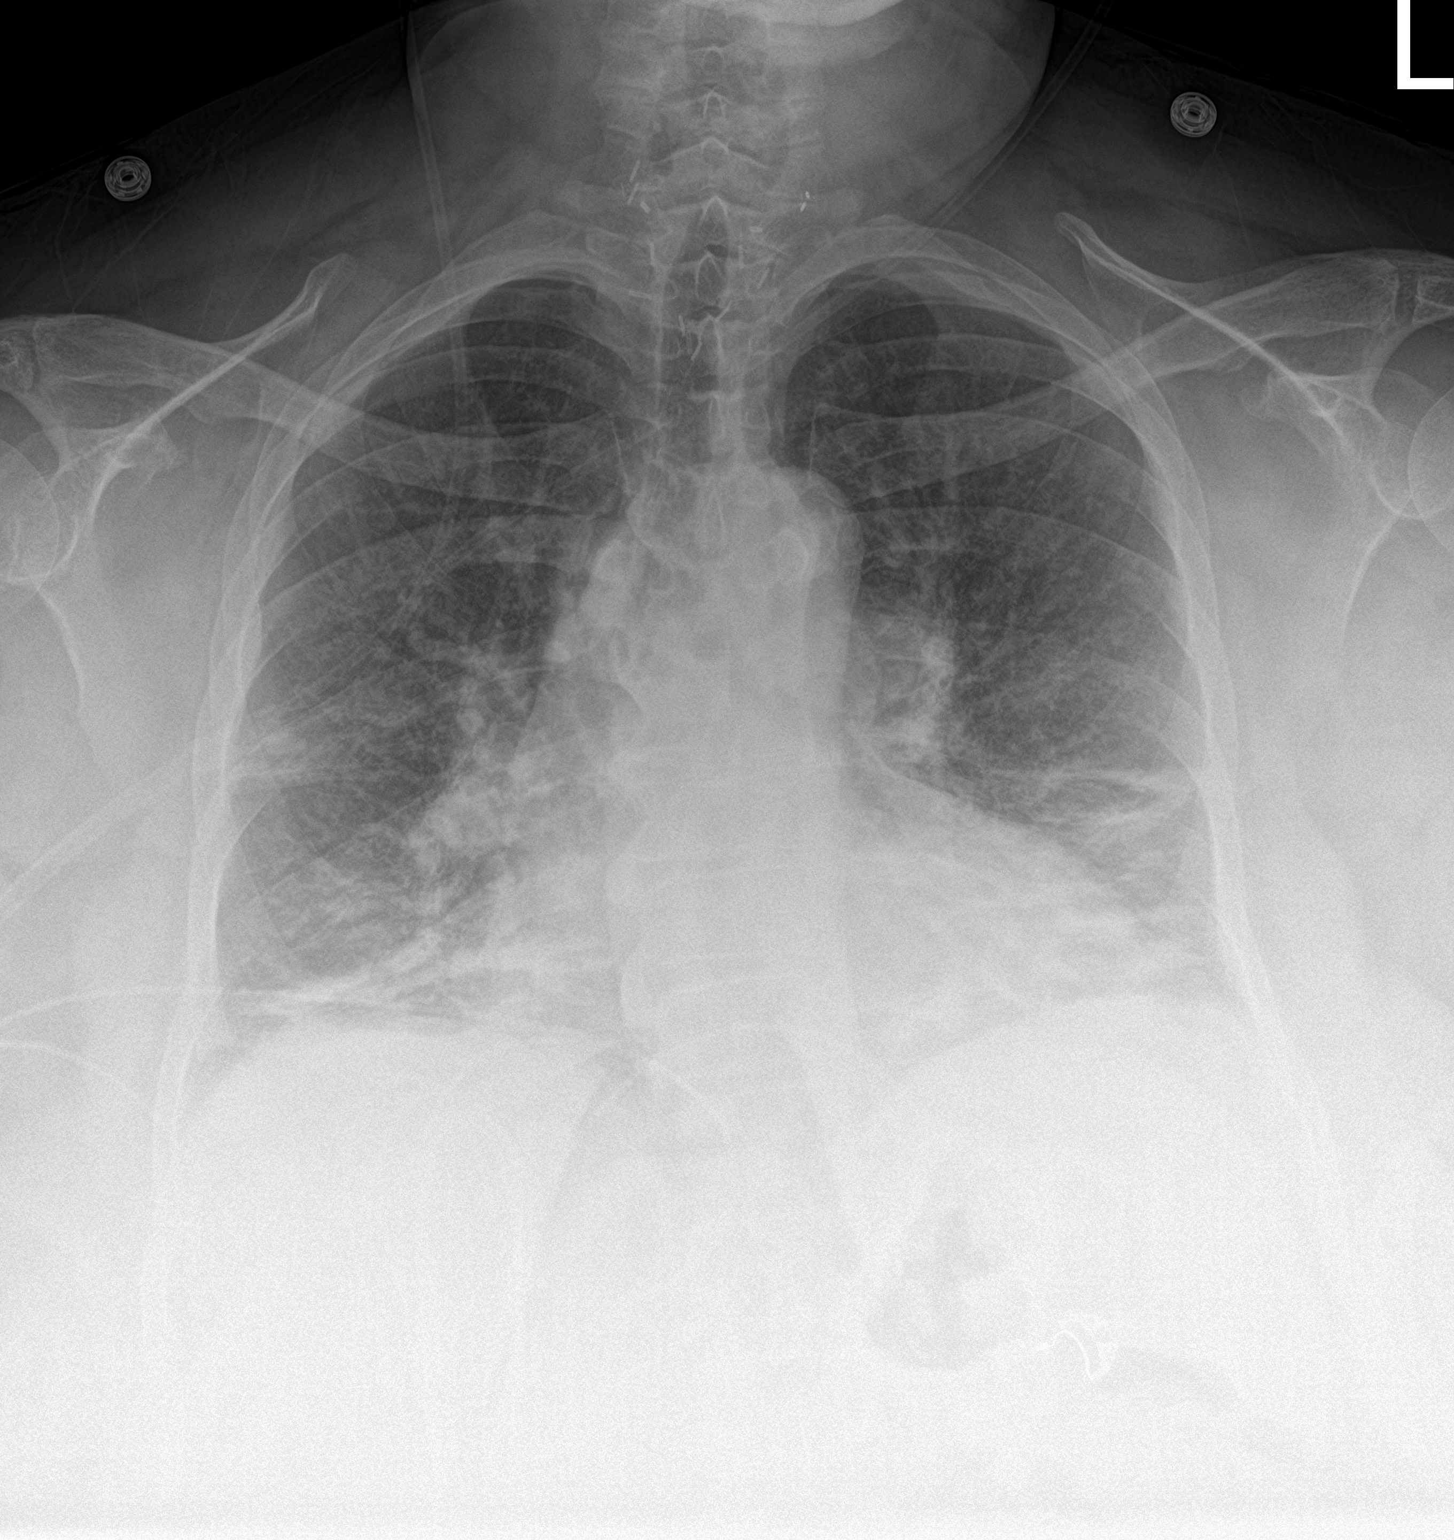

[chest lat]
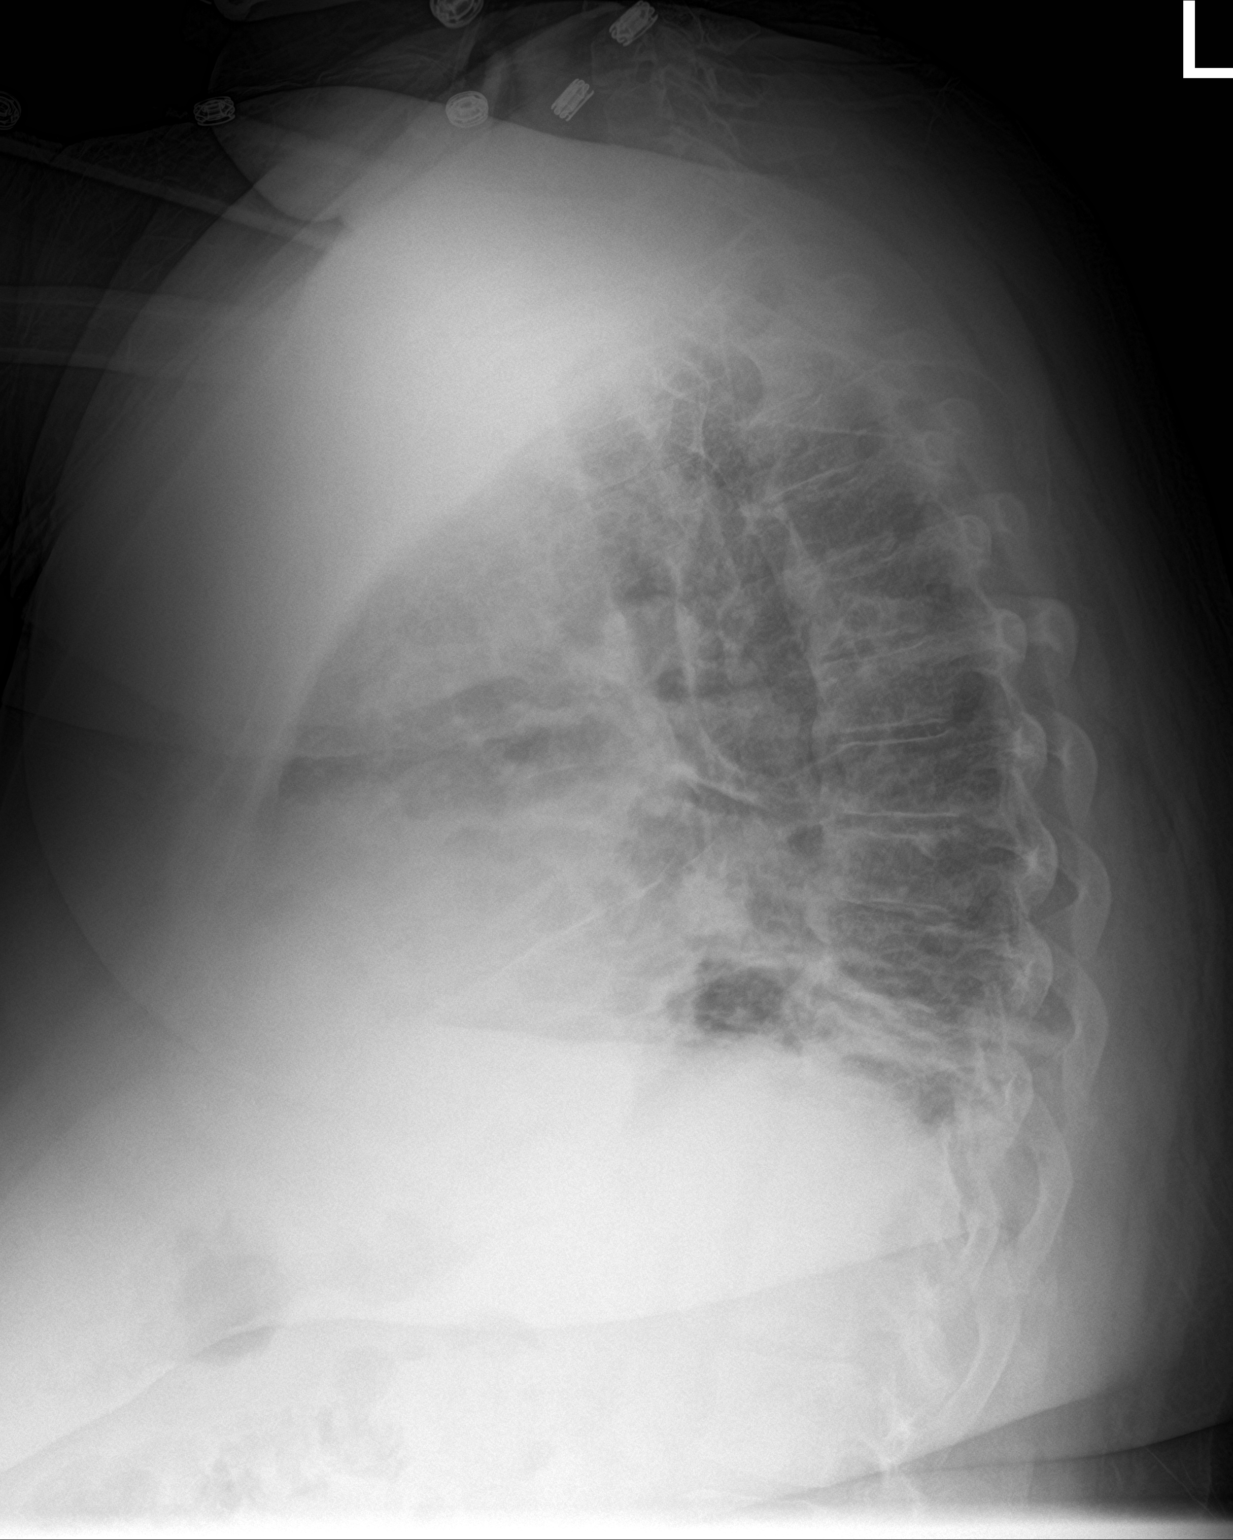

[2 of 2 positions shown; findings below may reference images not displayed]

FINDINGS: There is cardiomegaly with pulmonary venous hypertension. There is
bibasilar interstitial edema. There is patchy atelectasis in the
lung bases. No new opacity is evident compared to 1 day prior. No
adenopathy. Postoperative changes noted in the region of the
thyroid. There is degenerative change in the lower thoracic spine.
IMPRESSION: Pulmonary vascular congestion with interstitial edema. There is felt
to be a degree of underlying congestive heart failure. There is
bibasilar atelectasis. No frank consolidation. No new opacity
evident. Status post thyroidectomy.

## 2021-03-10 NOTE — Patient Outreach (Signed)
Care Coordination  03/10/2021  Michele Owens Jul 04, 1960 [id number]  RNCM returned patient's phone call-no answer and unable to leave a message as voicemail full.  [name] RN, BSN [place]  [facility] - Managed [employer] High Risk [phone].

## 2021-03-11 IMAGING — CR RIGHT FEMUR 2 VIEWS
4 series · 4 of 4 positions shown · non-contrast
Comparison: None.

CLINICAL DATA: Pain after fall.

EXAM:
RIGHT FEMUR 2 VIEWS

[femur ap (1 of 2)]
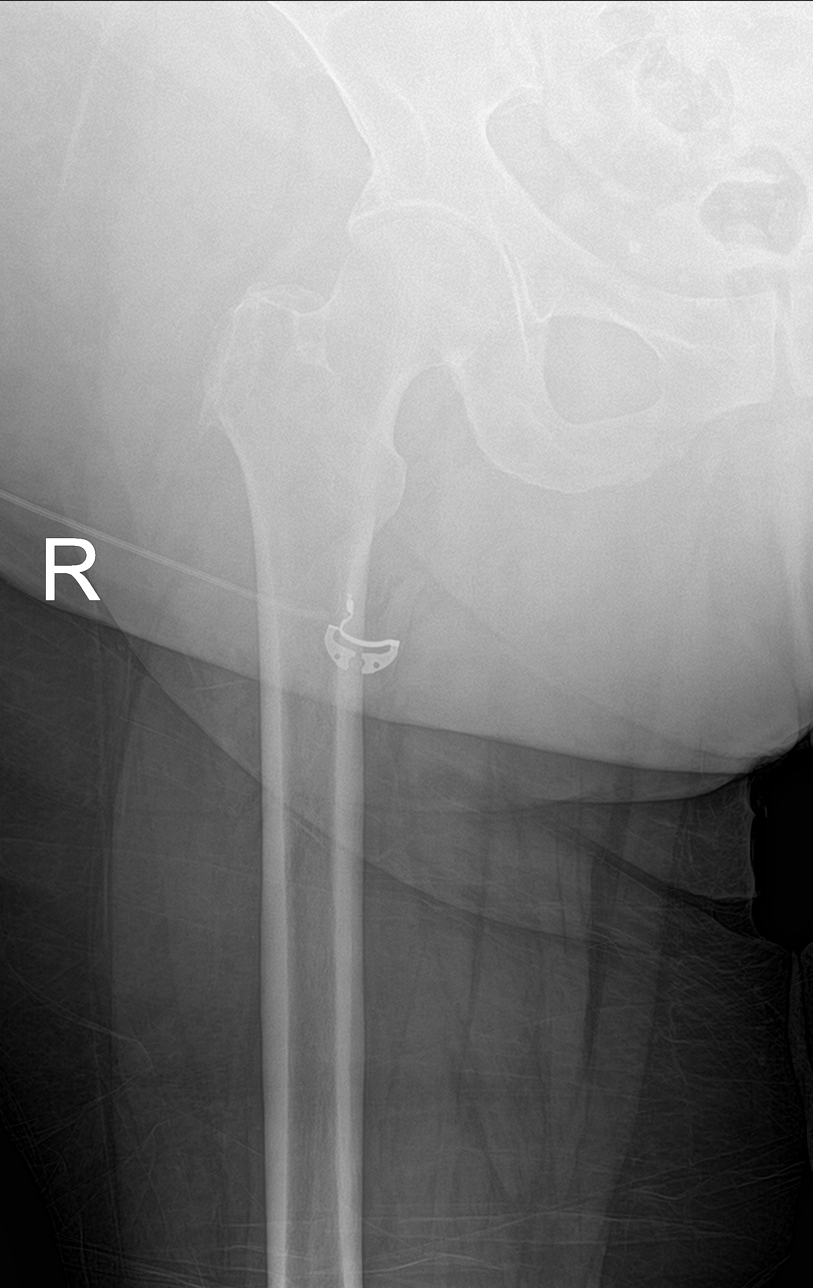

[femur ap (2 of 2)]
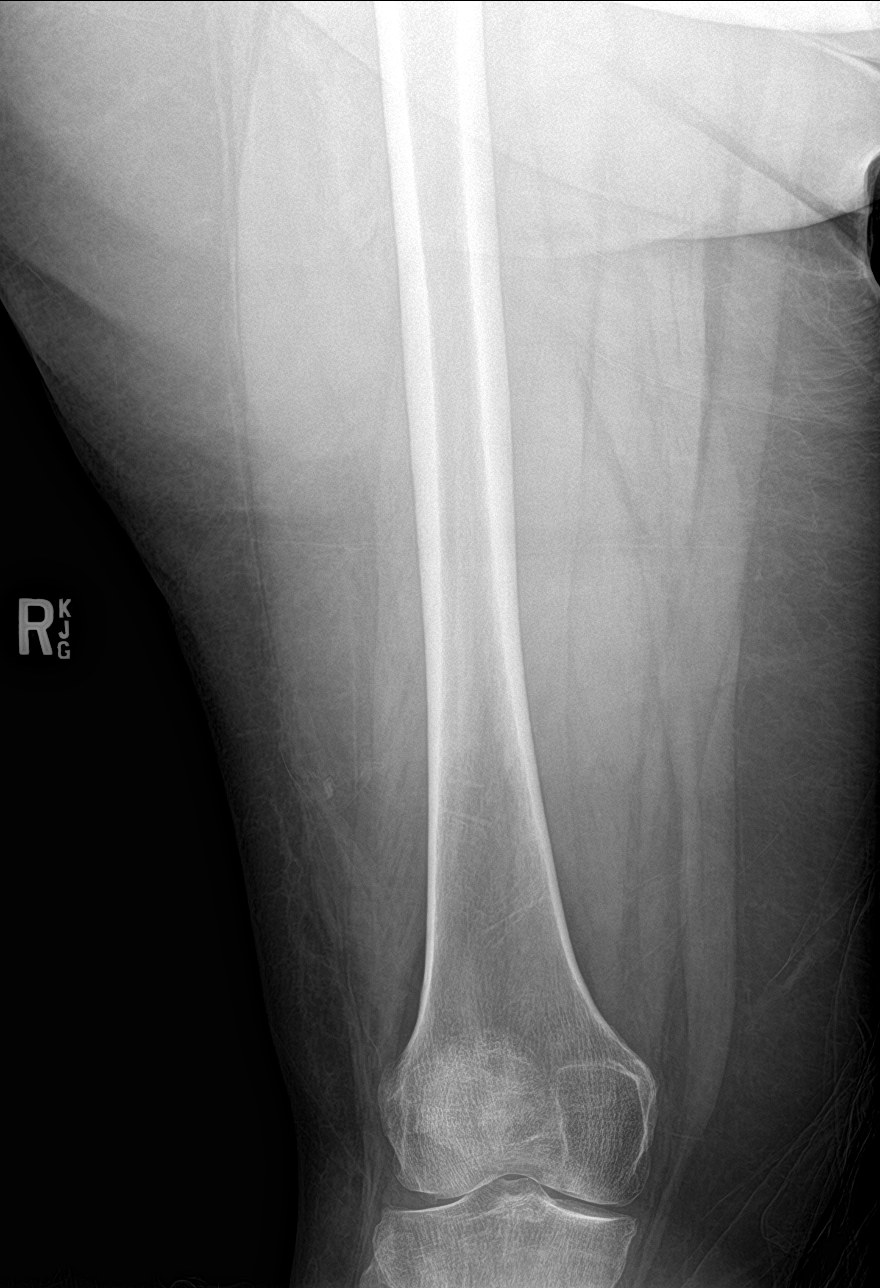

[femur lat (1 of 2)]
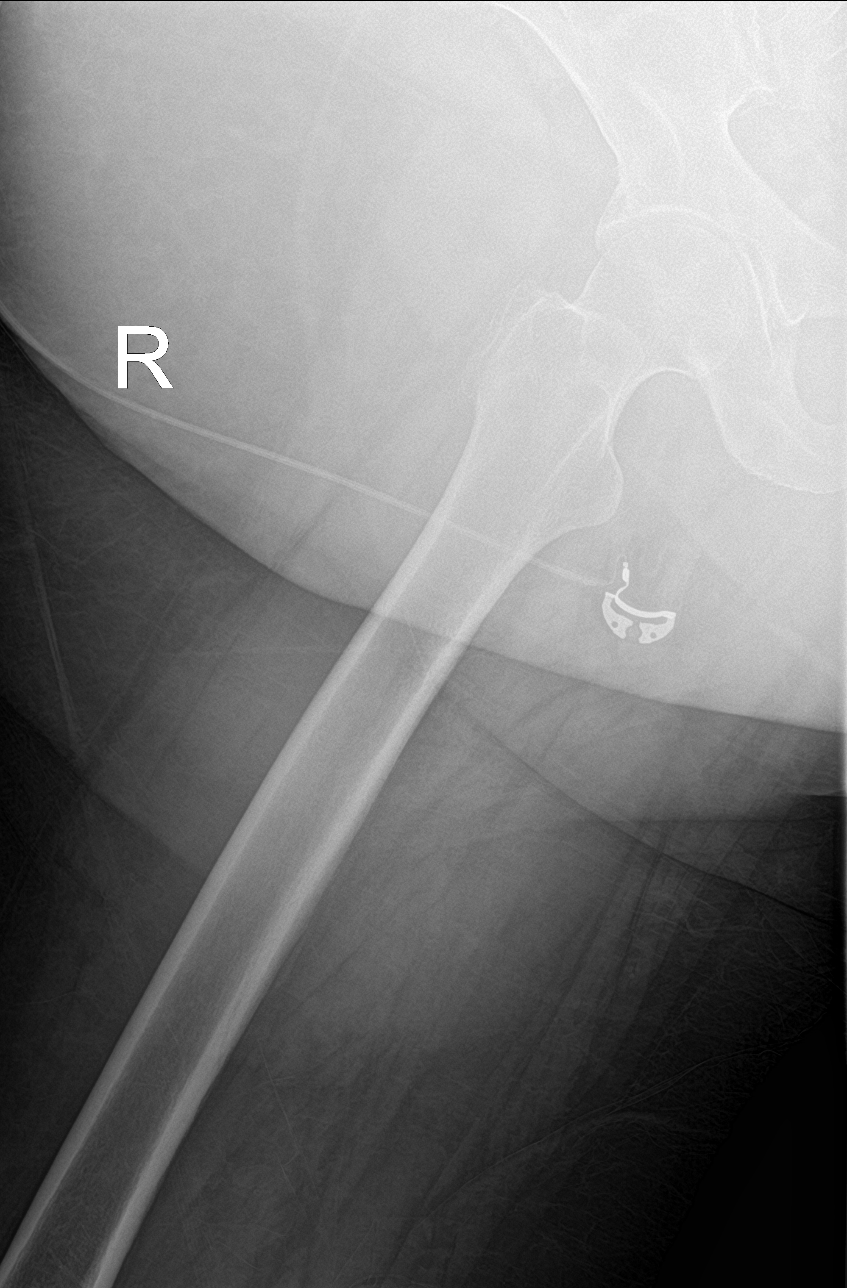

[femur lat (2 of 2)]
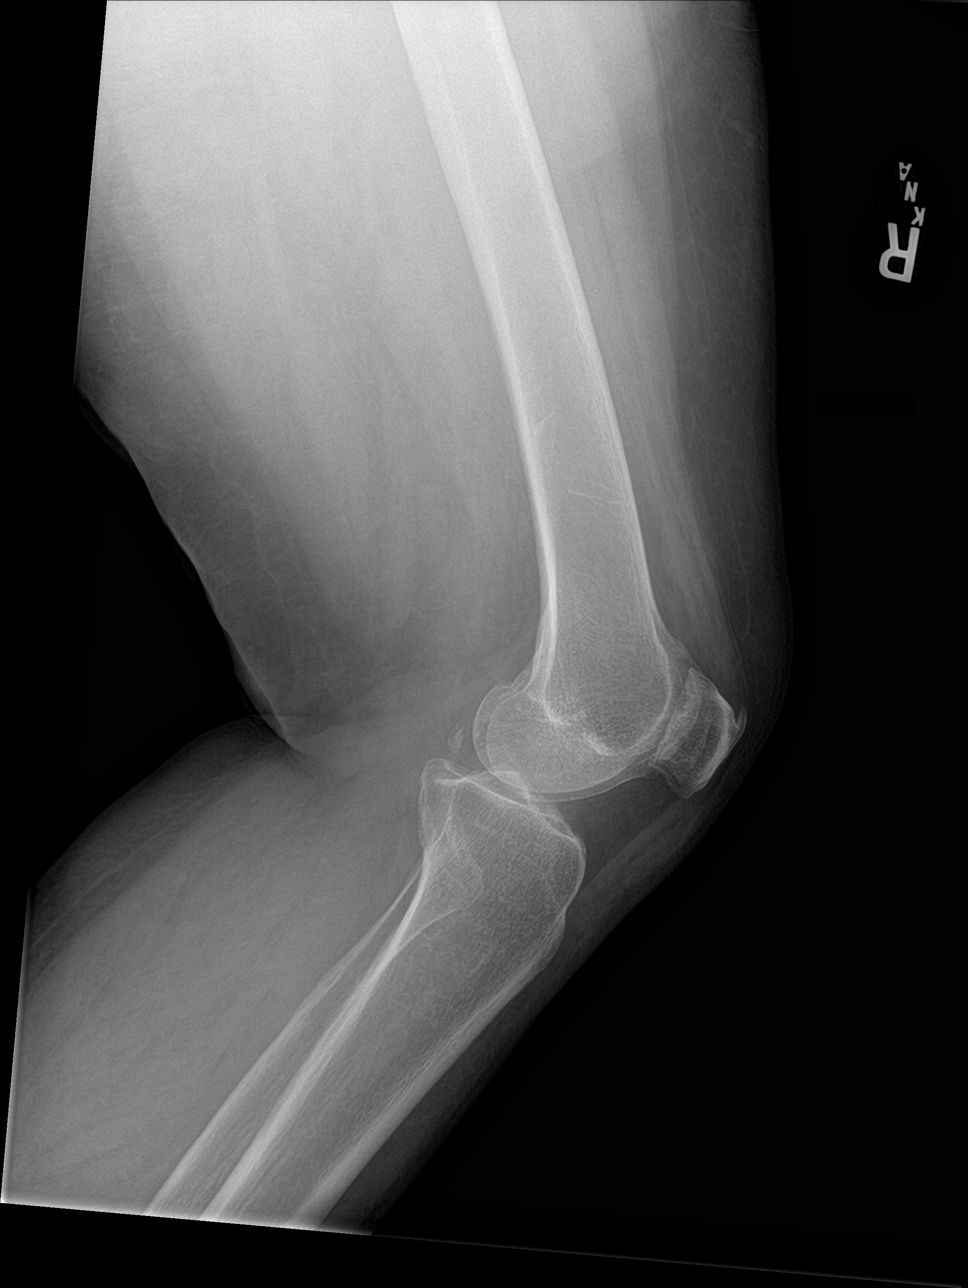

[4 of 4 positions shown; findings below may reference images not displayed]

FINDINGS: There is no evidence of fracture or other focal bone lesions. Soft
tissues are unremarkable.
IMPRESSION: Negative.

## 2021-03-11 IMAGING — CR DG HIP (WITH OR WITHOUT PELVIS) 2-3V RIGHT
3 series · 3 of 3 positions shown · non-contrast
Comparison: 06/27/2018

CLINICAL DATA: Fall with right upper leg pain.

EXAM:
DG HIP (WITH OR WITHOUT PELVIS) 2-3V RIGHT

[hip ap]
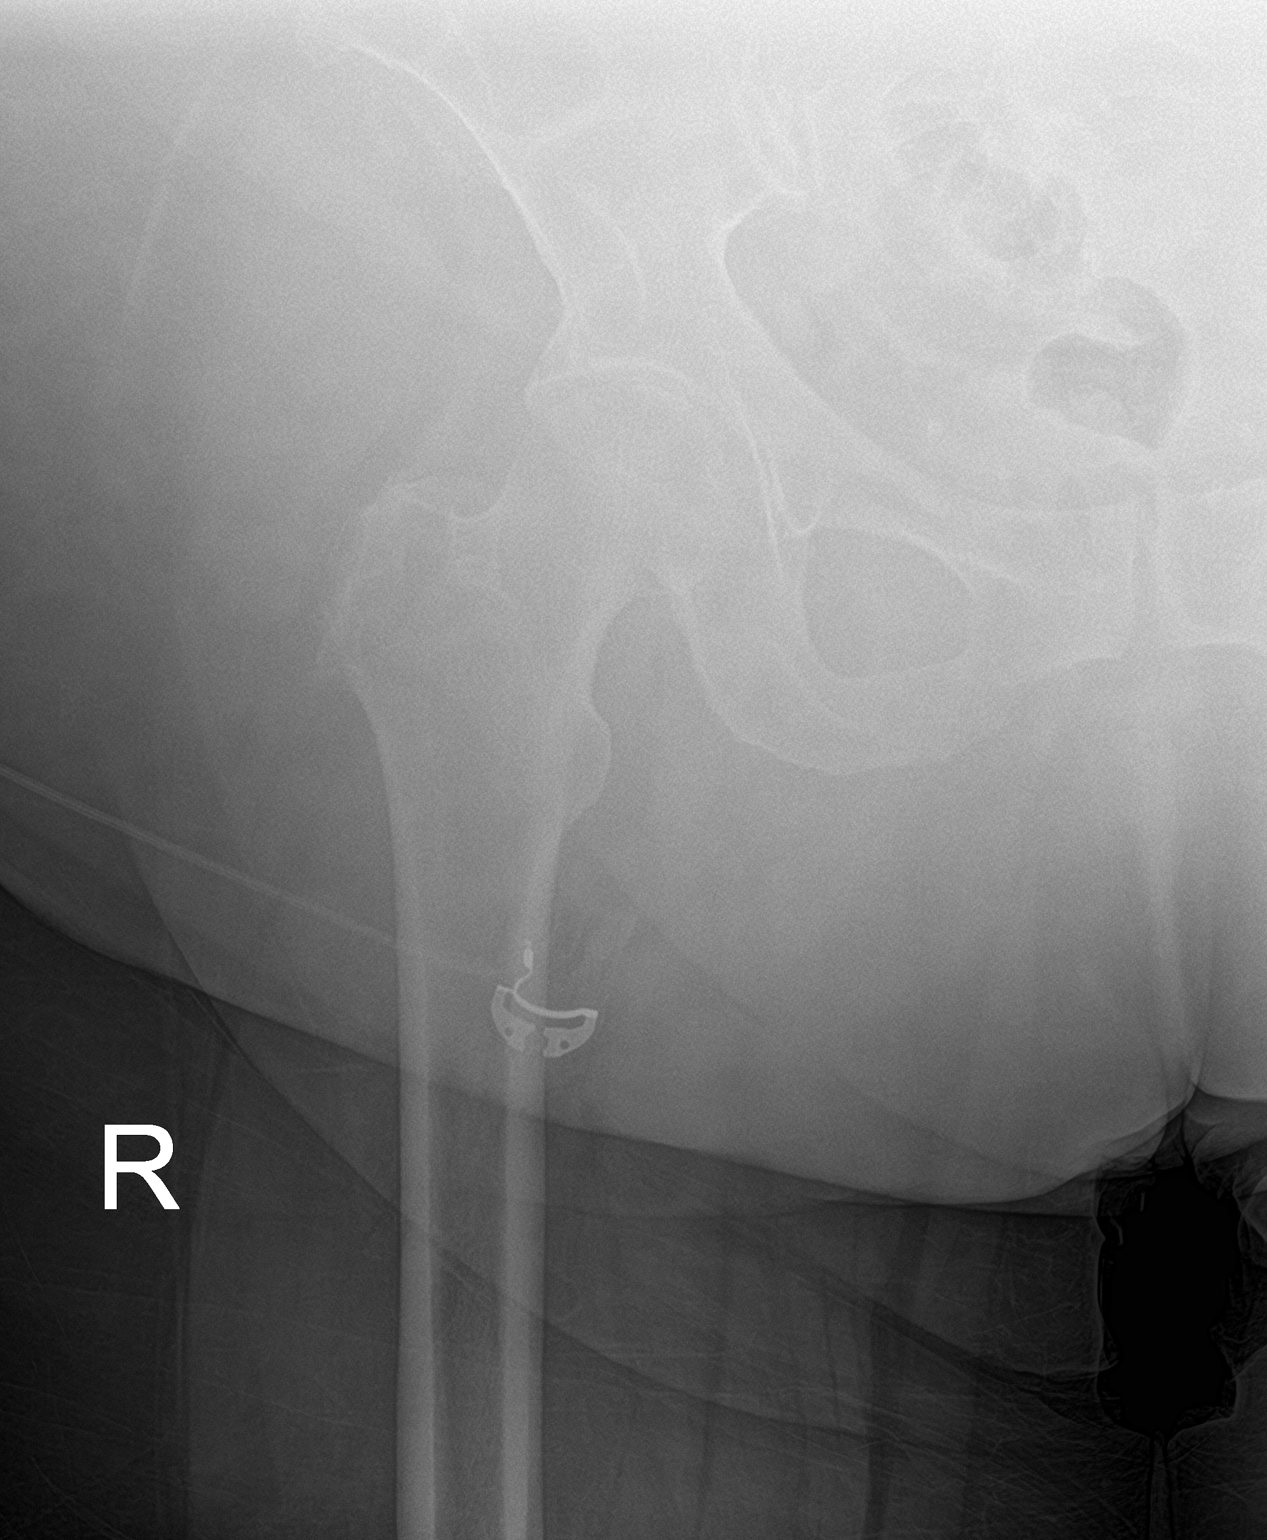

[hip lat]
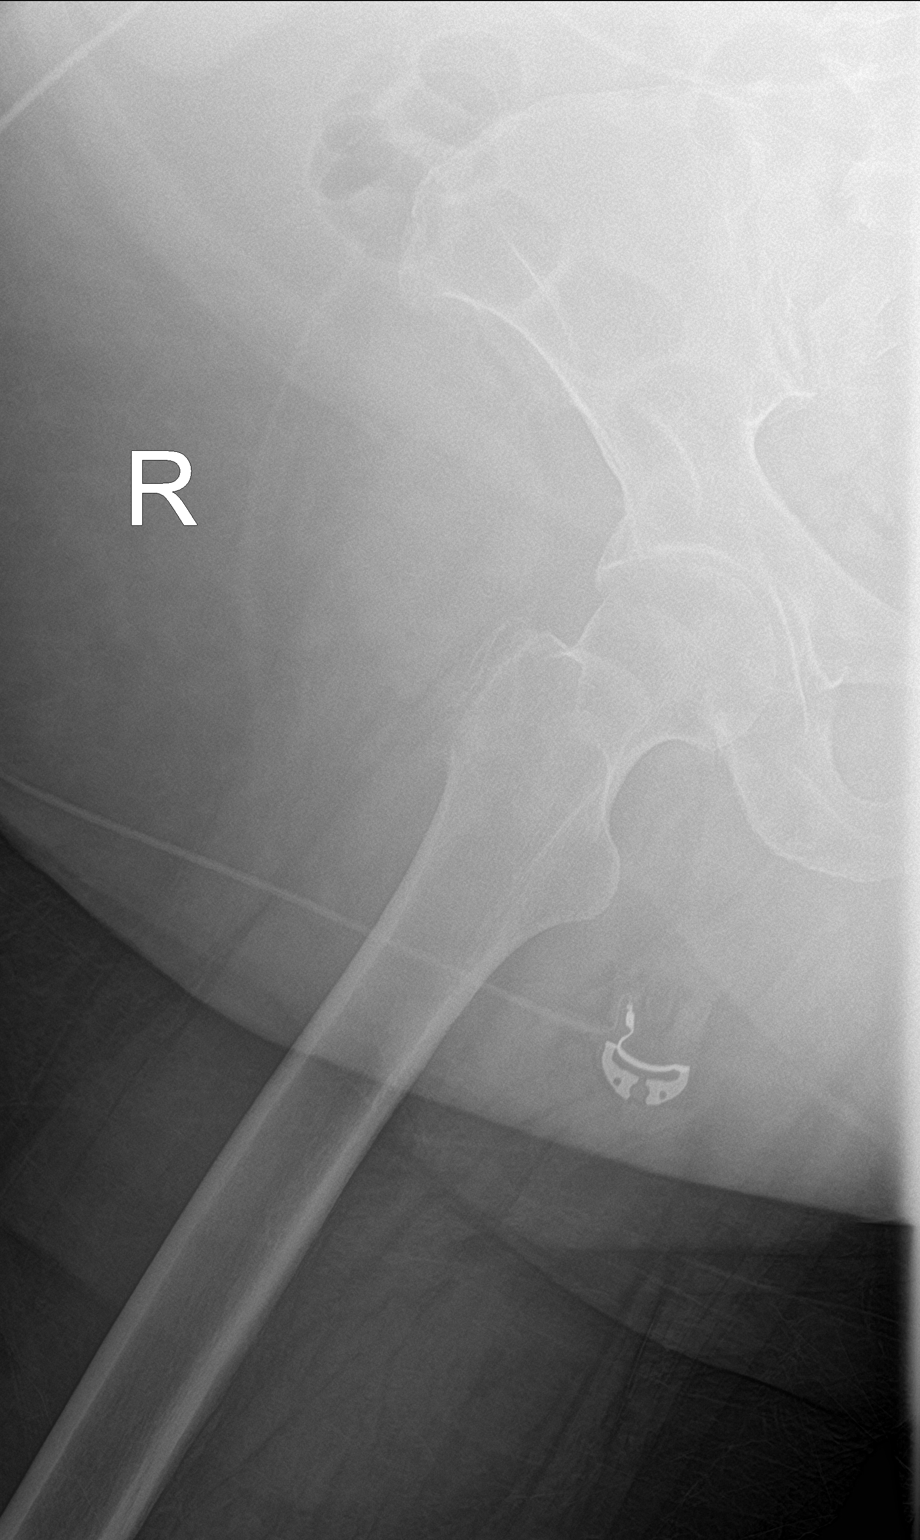

[pelvis ap]
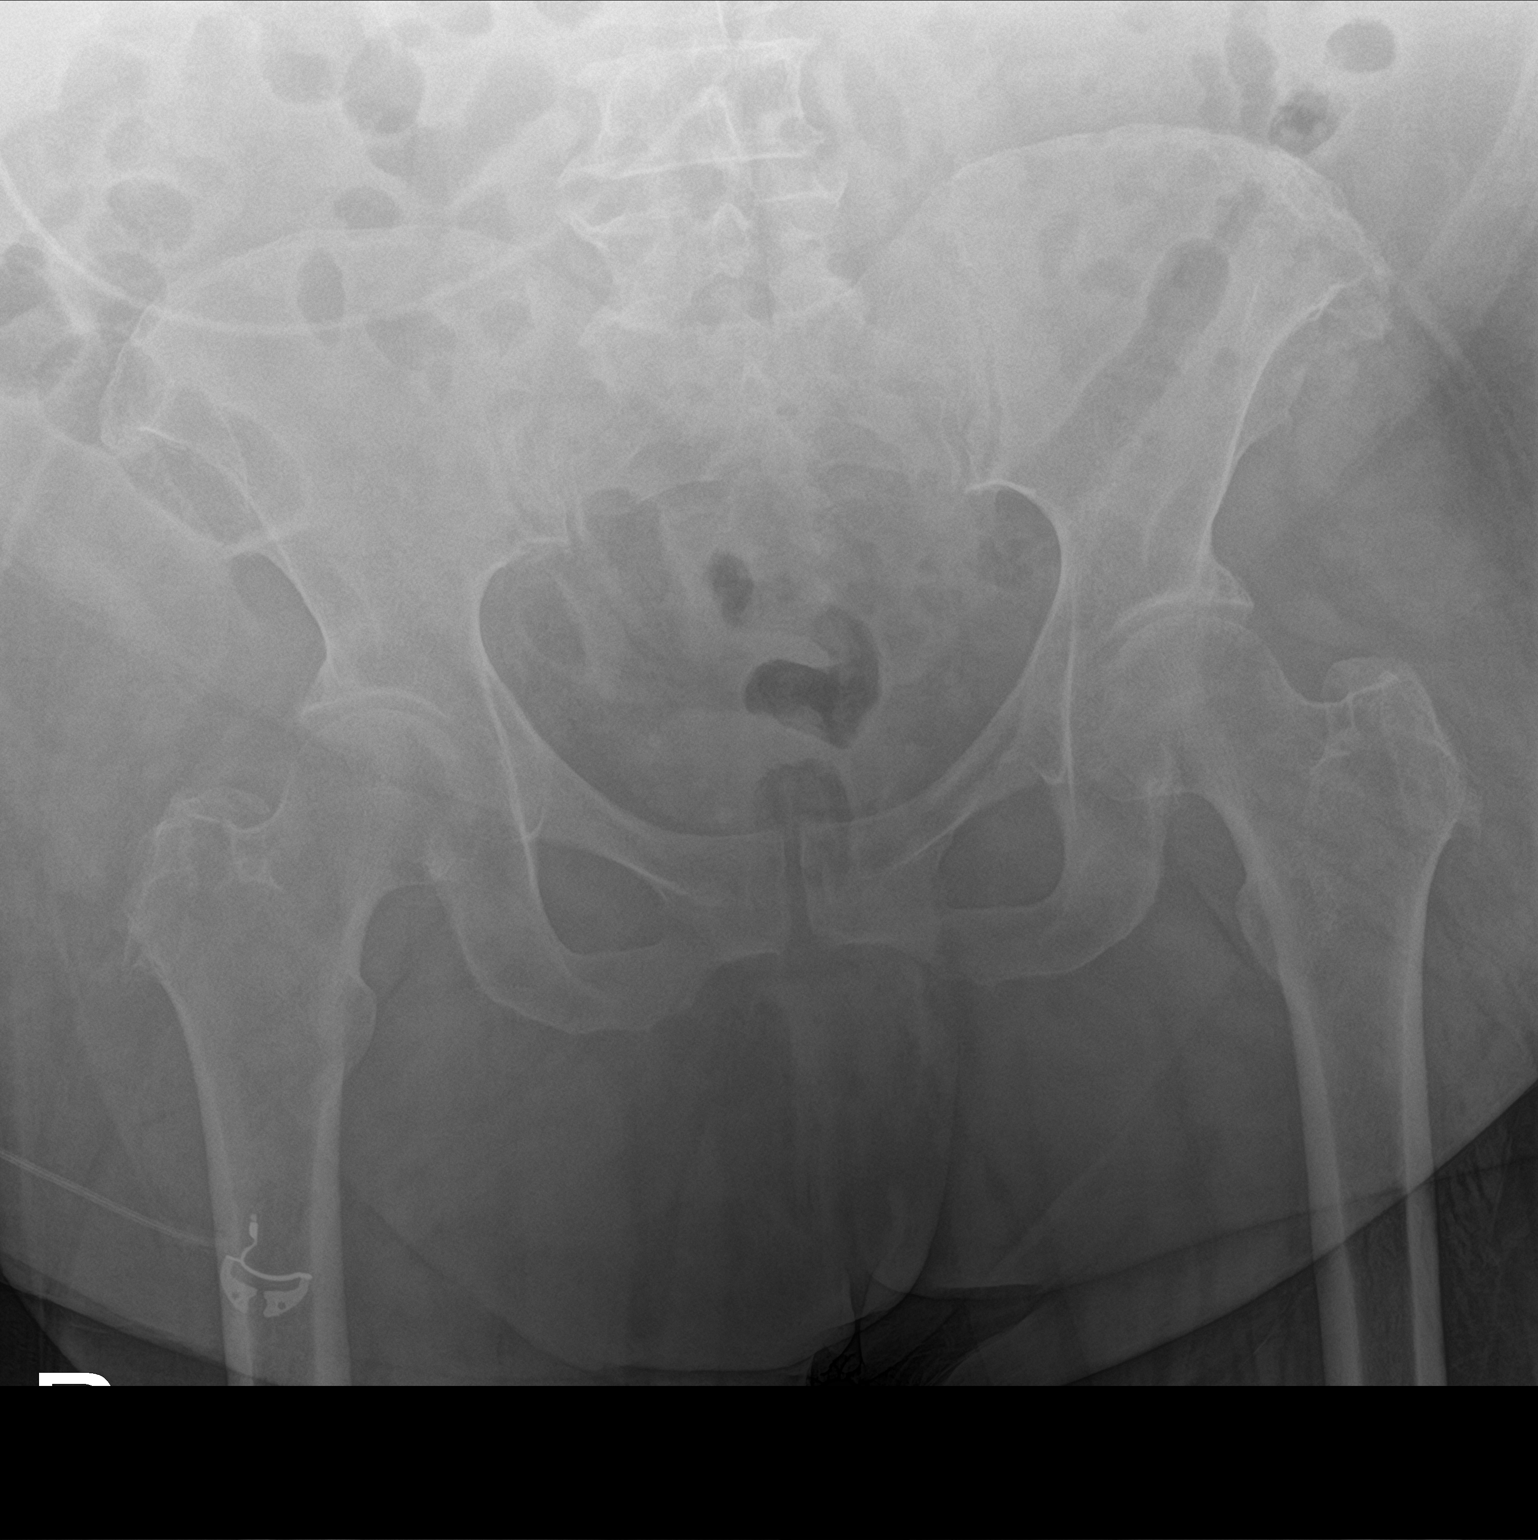

[3 of 3 positions shown; findings below may reference images not displayed]

FINDINGS: There is no evidence of acute hip fracture or dislocation. The bony
pelvis is intact without evidence of fracture or diastasis. Stable
mild osteoarthritis of both hip joints.
IMPRESSION: No acute fractures identified.

## 2021-03-11 IMAGING — DX PORTABLE CHEST - 1 VIEW
1 series · 1 of 1 positions shown · non-contrast
Comparison: 07/05/2018

CLINICAL DATA: Shortness of breath

EXAM:
PORTABLE CHEST 1 VIEW

[chest ap]
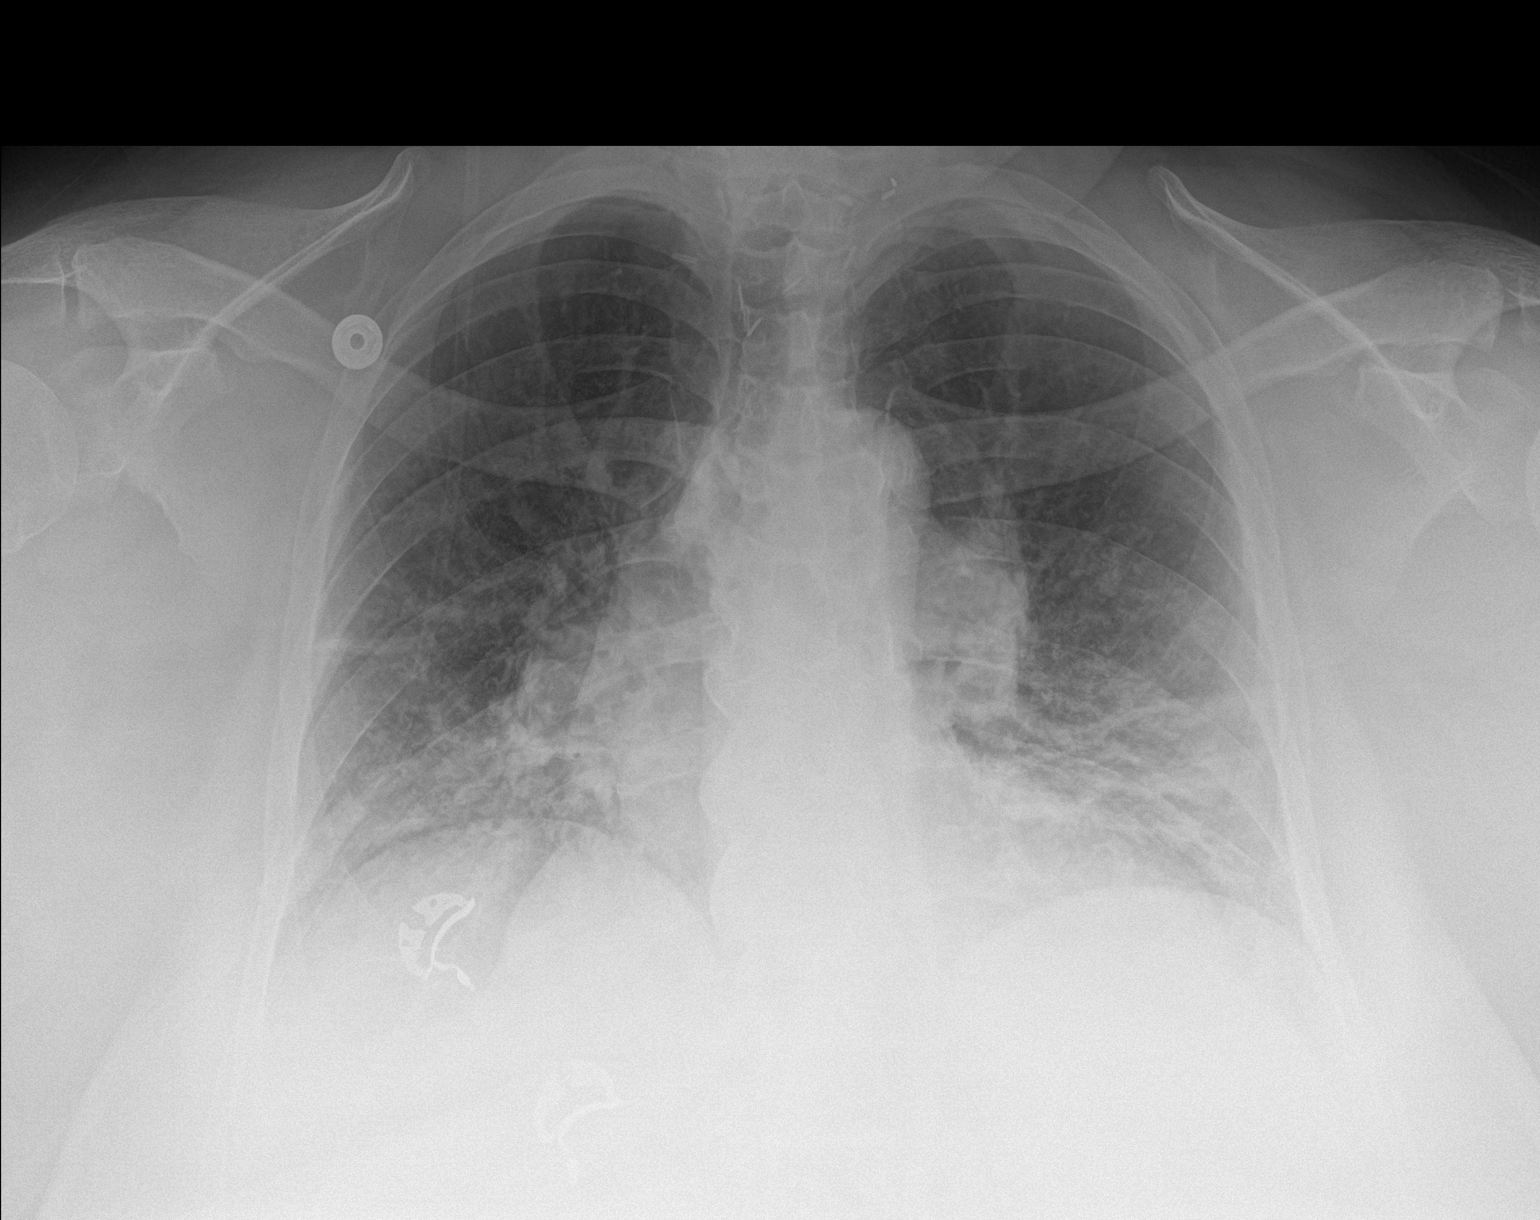

[1 of 1 positions shown; findings below may reference images not displayed]

FINDINGS: Lingular/left lower lobe scarring/atelectasis. Mild right basilar
atelectasis. No focal consolidation. No frank interstitial edema. No
pleural effusion or pneumothorax.

The heart is normal in size.

Degenerative changes of the visualized thoracolumbar spine.
IMPRESSION: Lingular and bilateral lower lobe scarring/atelectasis.

No evidence of acute cardiopulmonary disease.

## 2021-03-11 IMAGING — CR NECK SOFT TISSUES - 1+ VIEW
2 series · 2 of 2 positions shown · non-contrast
Comparison: Cervical spine films on 04/17/2017

CLINICAL DATA: Left neck swelling. History of prior thyroidectomy
for thyroid carcinoma.

EXAM:
NECK SOFT TISSUES - 1+ VIEW

[neck lat]
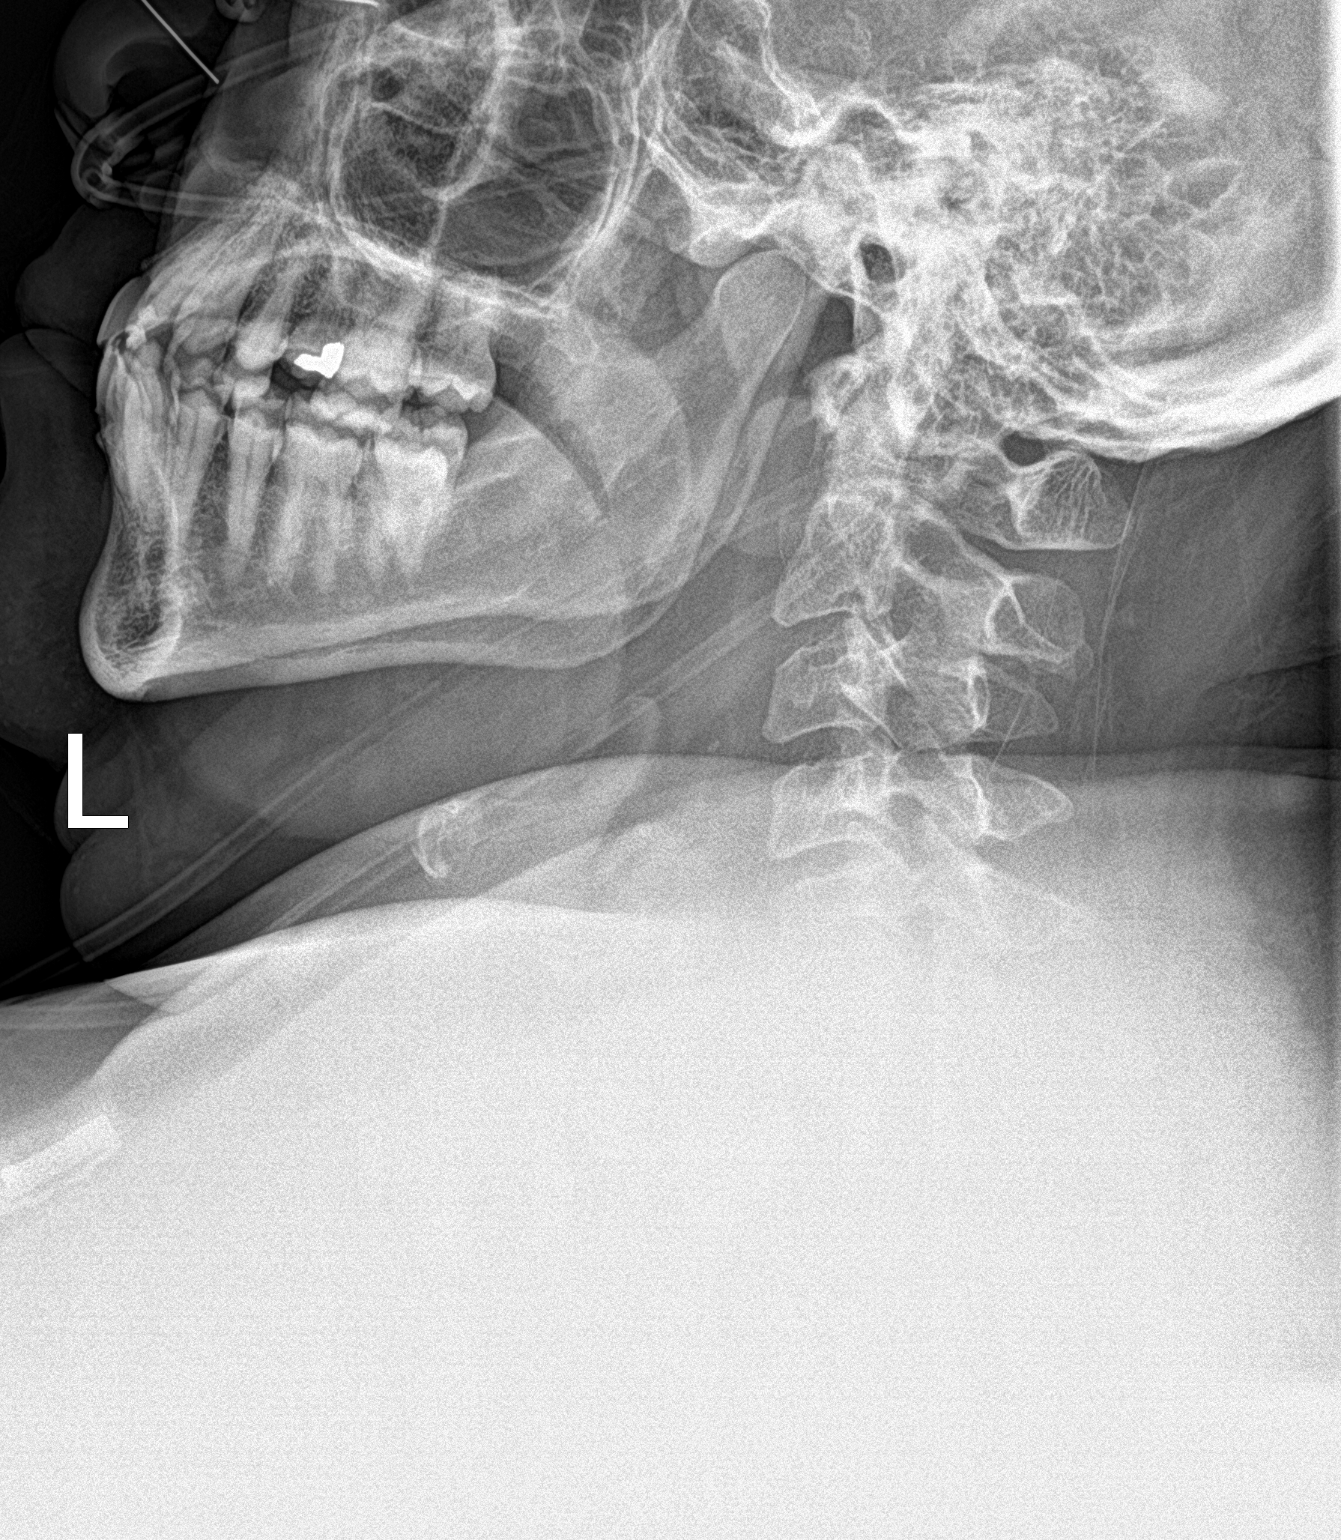

[neck ap]
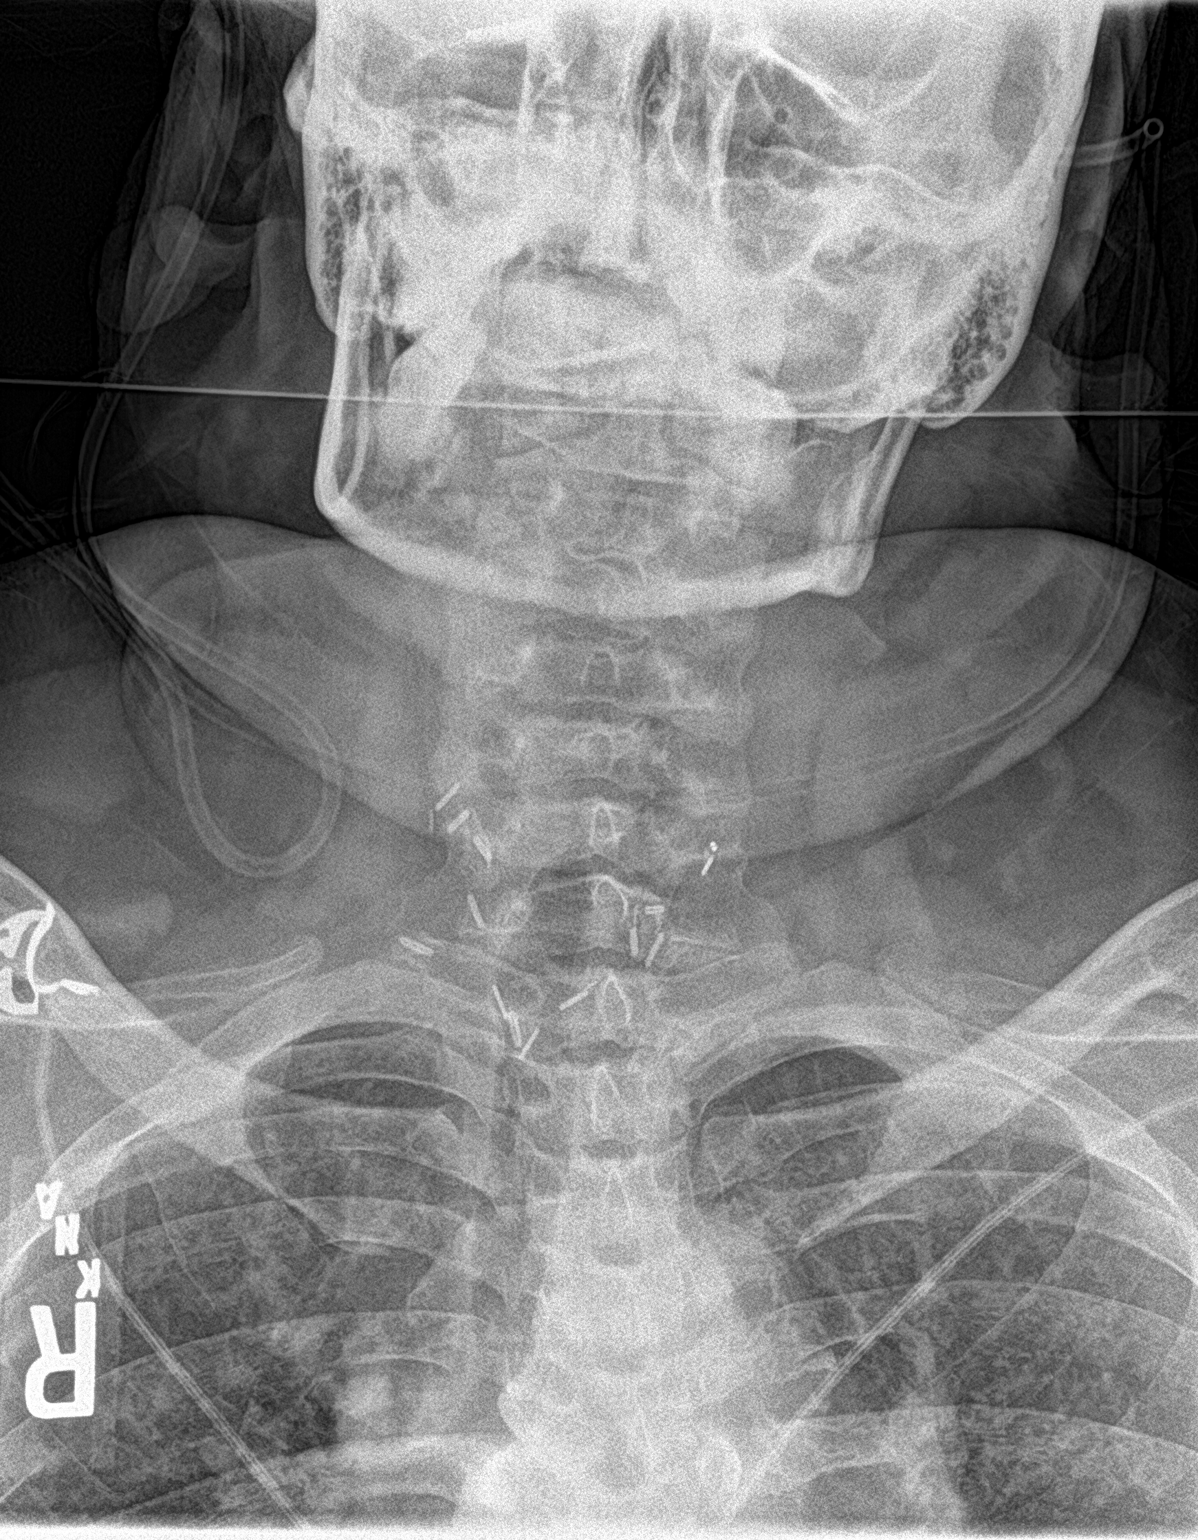

[2 of 2 positions shown; findings below may reference images not displayed]

FINDINGS: Frontal and lateral projections were obtained. Clips are present in
the neck related to prior thyroidectomy. No obvious soft tissue
findings. The visualized airway is normally patent. No prevertebral
soft tissue swelling visualized in the upper cervical region.
IMPRESSION: Unremarkable neck soft tissue x-rays.

## 2021-03-11 IMAGING — US SOFT TISSUE ULTRASOUND HEAD/NECK
1 series · 14 of 23 positions shown · non-contrast
Comparison: Neck ultrasound on 12/18/2016

CLINICAL DATA: Swelling of left neck and history of prior
thyroidectomy for thyroid carcinoma.

EXAM:
ULTRASOUND OF HEAD/NECK SOFT TISSUES
TECHNIQUE: Ultrasound examination of the head and neck soft tissues was
performed in the area of clinical concern.

[Series 1: soft tissue ultrasound head/neck · 14 of 23 slices shown]
[im 1/23]
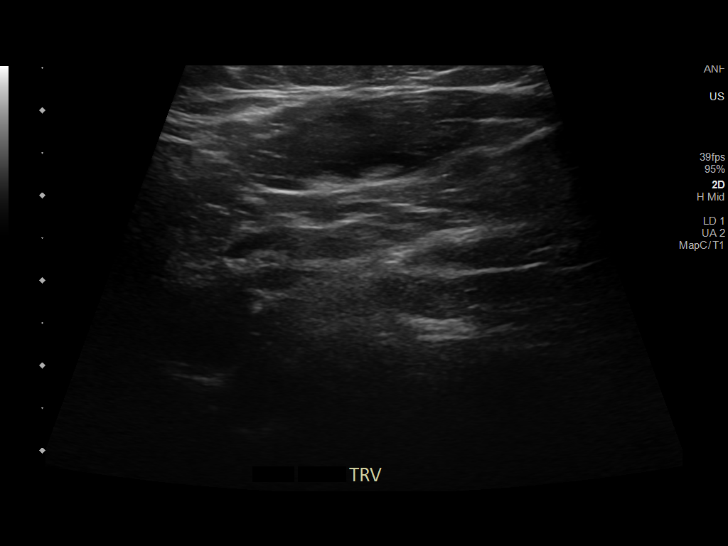
[im 3/23]
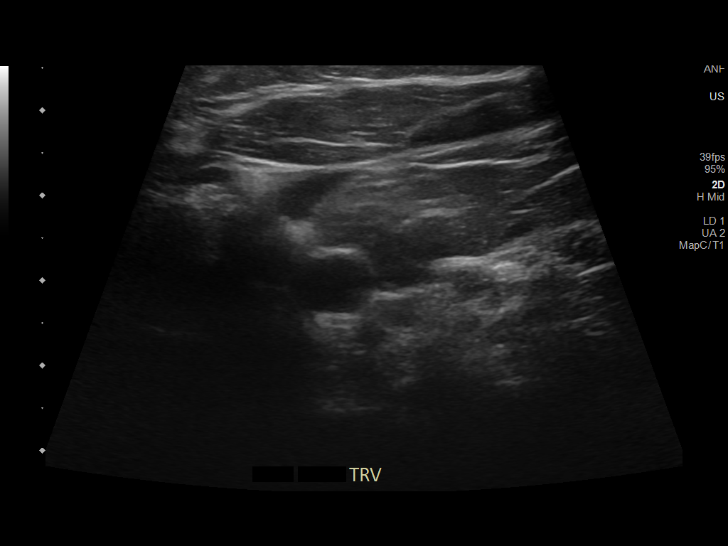
[im 5/23]
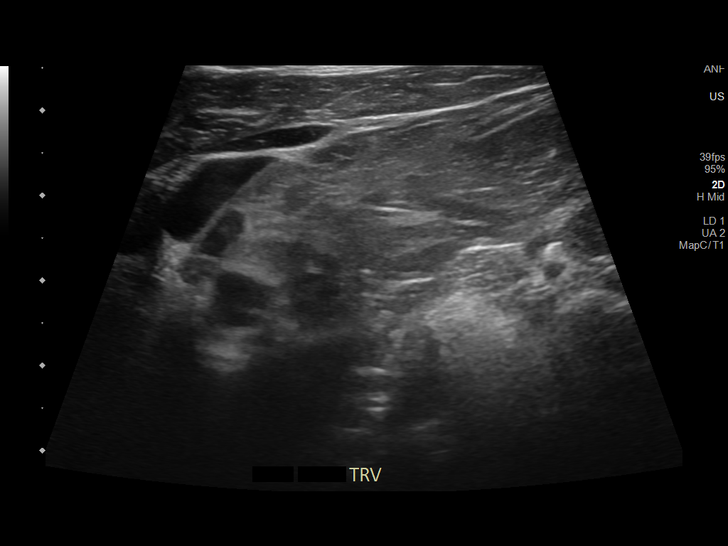
[im 6/23]
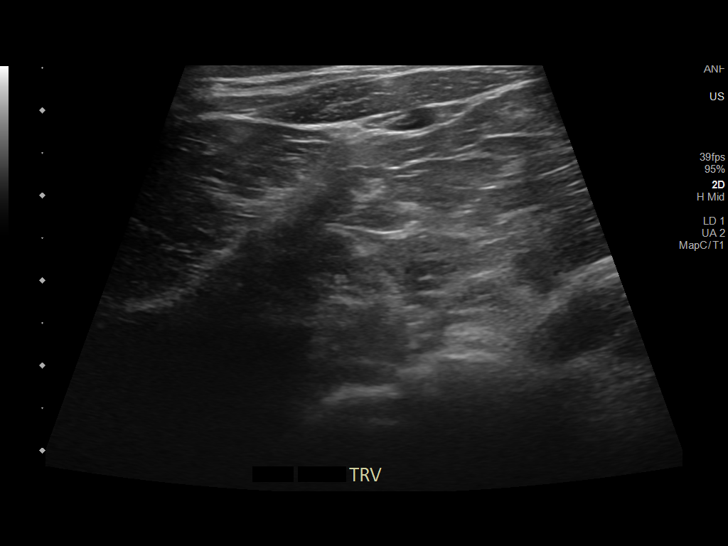
[im 8/23]
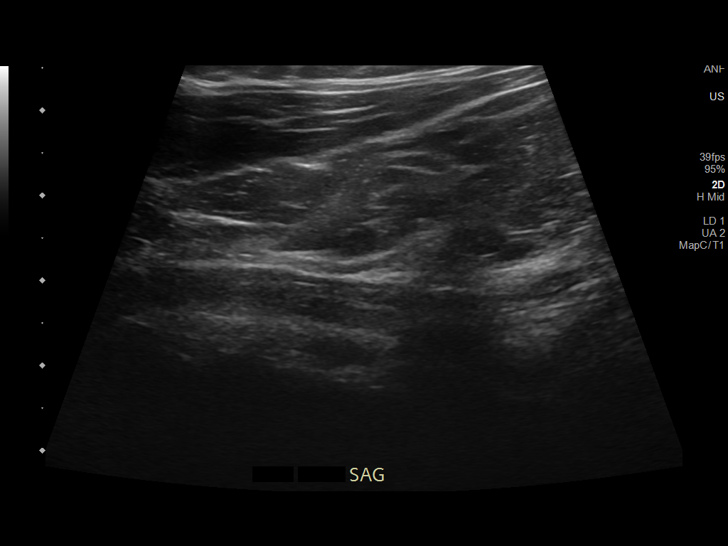
[im 10/23]
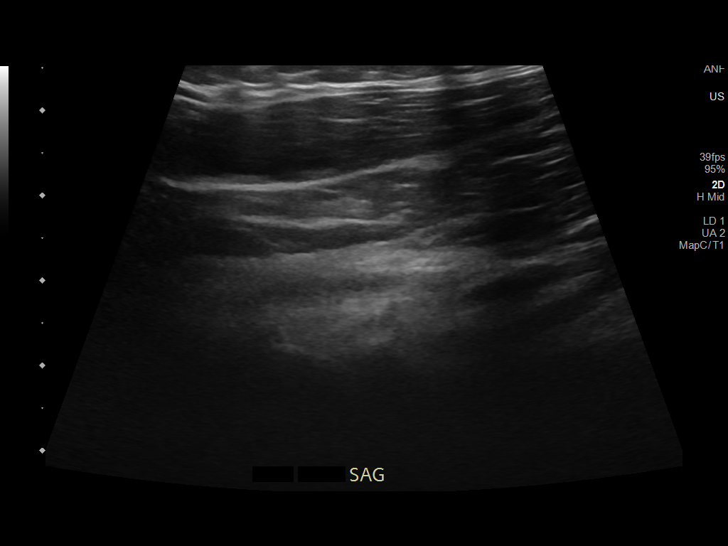
[im 11/23]
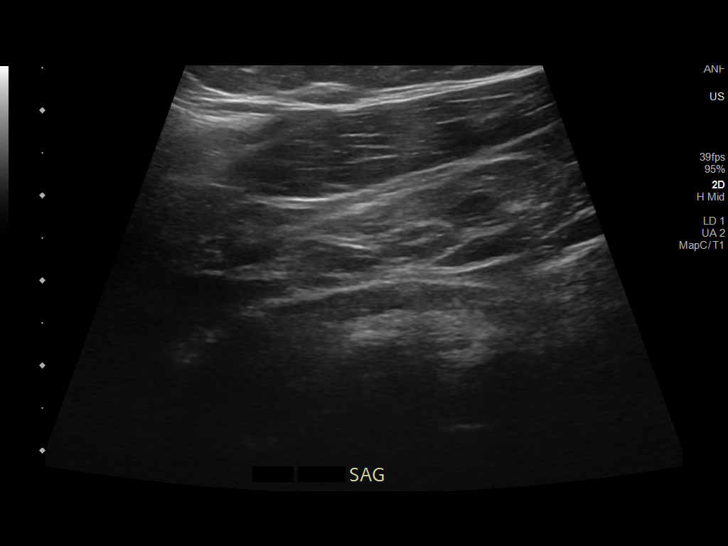
[im 13/23]
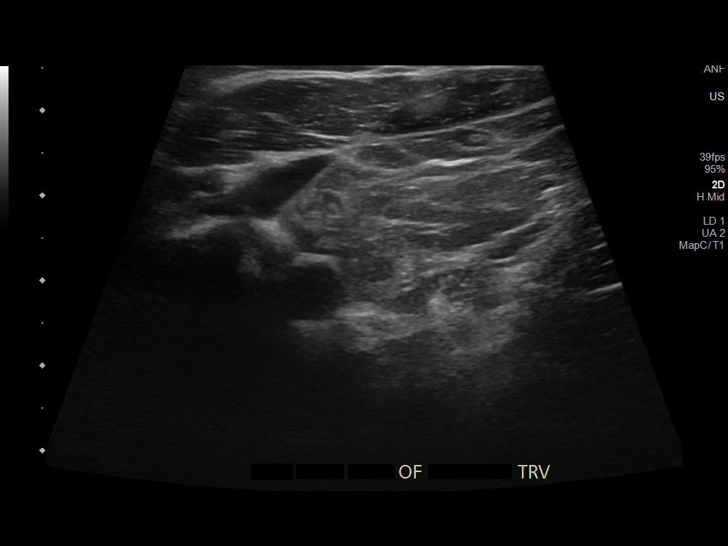
[im 14/23]
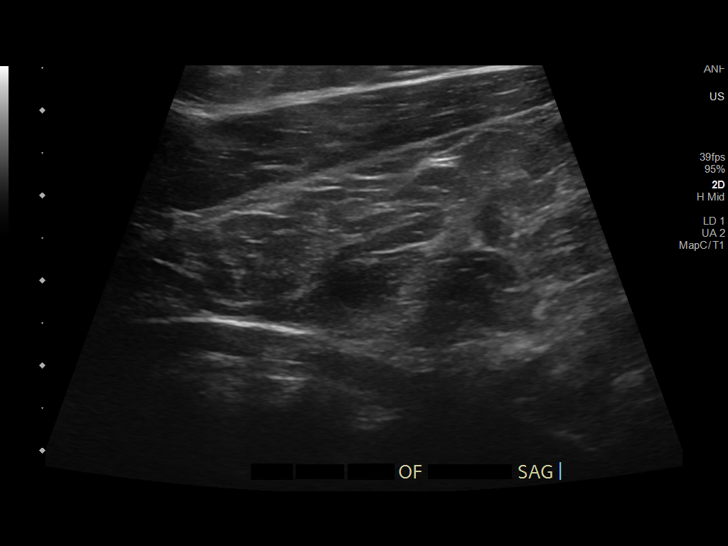
[im 16/23]
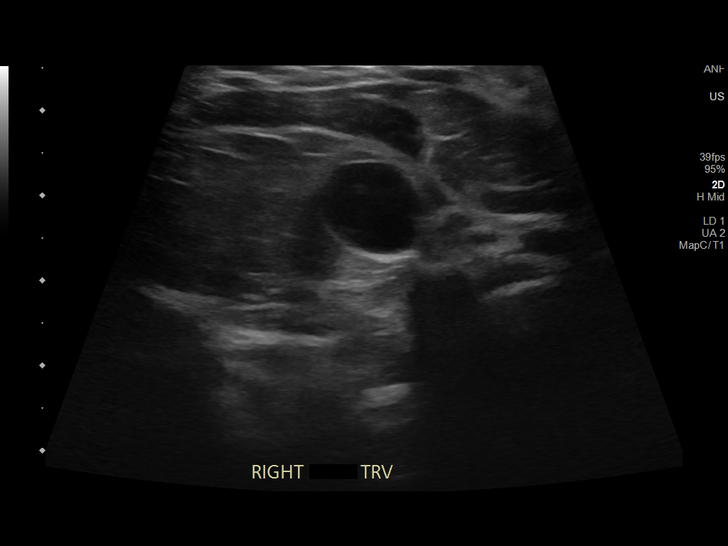
[im 18/23]
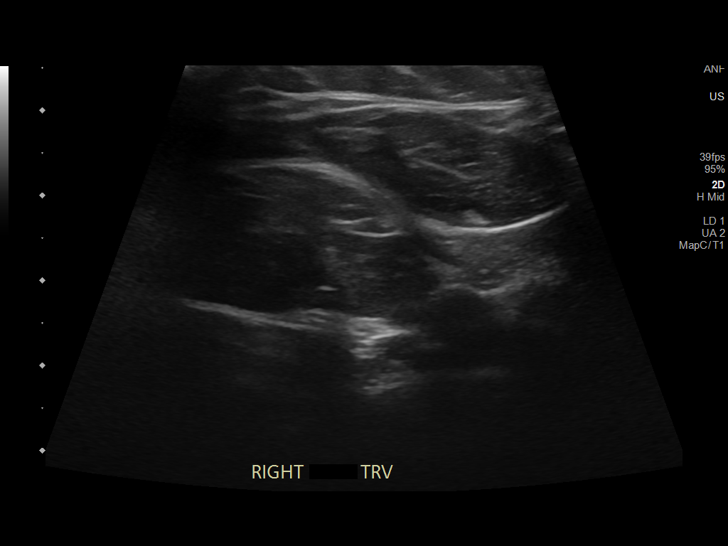
[im 19/23]
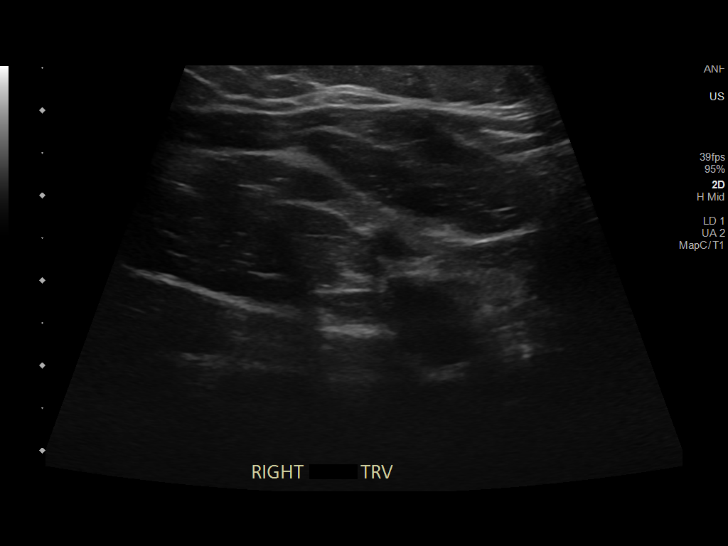
[im 21/23]
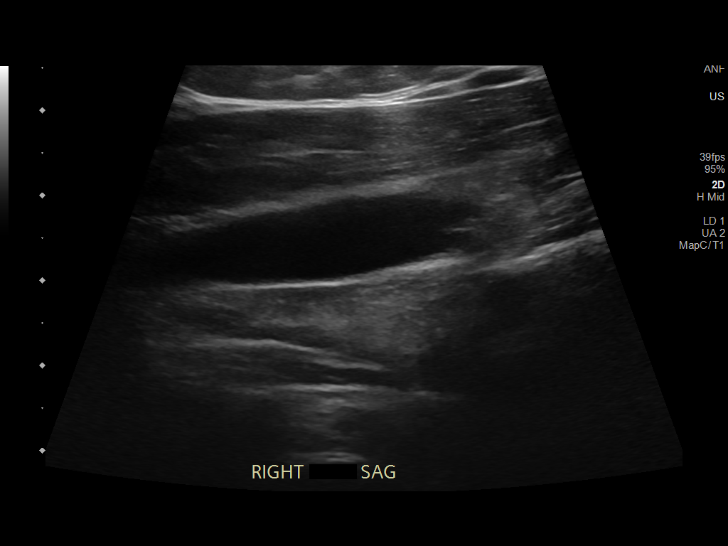
[im 23/23]
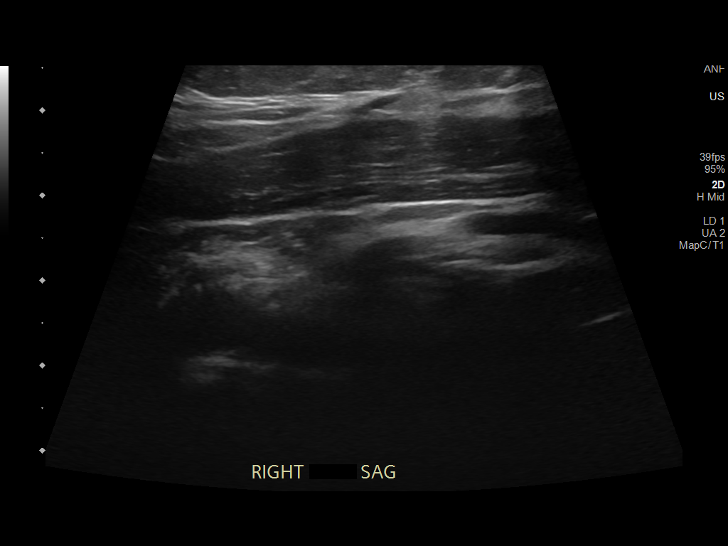

[14 of 23 positions shown; findings below may reference images not displayed]

FINDINGS: Ultrasound demonstrates no soft tissue mass, lymphadenopathy or
abnormal soft tissue in the neck. No abnormal fluid collections
identified. No findings suspicious for recurrent thyroid carcinoma.
IMPRESSION: Unremarkable neck ultrasound demonstrating no evidence of
lymphadenopathy or abnormal soft tissue mass.

## 2021-03-12 IMAGING — DX PORTABLE CHEST - 1 VIEW
1 series · 1 of 1 positions shown · non-contrast
Comparison: Radiograph July 06, 2018.

CLINICAL DATA: Shortness of breath.

EXAM:
PORTABLE CHEST 1 VIEW

[chest ap]
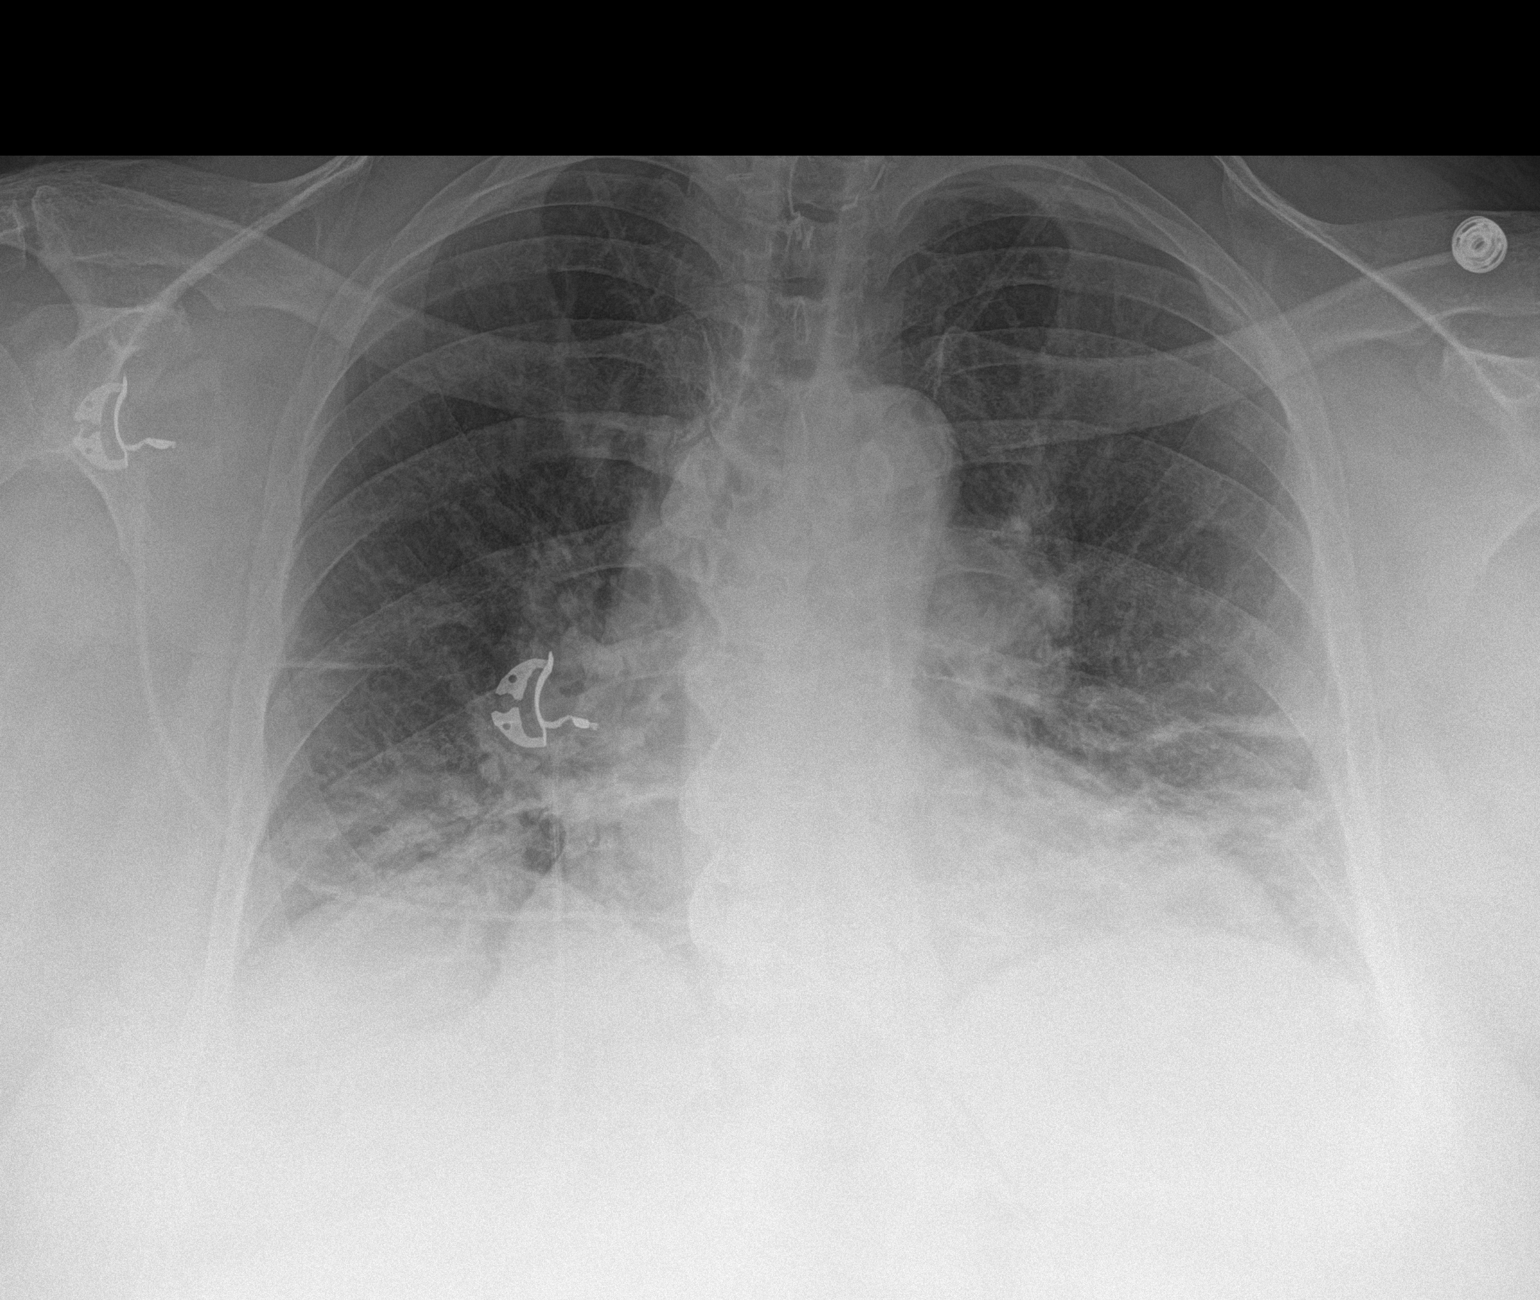

[1 of 1 positions shown; findings below may reference images not displayed]

FINDINGS: Stable cardiomediastinal silhouette. No pneumothorax is noted.
Stable bibasilar opacities are noted concerning for atelectasis or
possibly infiltrates. No pleural effusion is noted. Bony thorax is
unremarkable.
IMPRESSION: Stable bibasilar opacities concerning for atelectasis or
infiltrates.

## 2021-03-14 IMAGING — DX PORTABLE CHEST - 1 VIEW
1 series · 1 of 1 positions shown · non-contrast
Comparison: 07/08/2018

CLINICAL DATA: Shortness of breath

EXAM:
PORTABLE CHEST 1 VIEW

[chest ap]
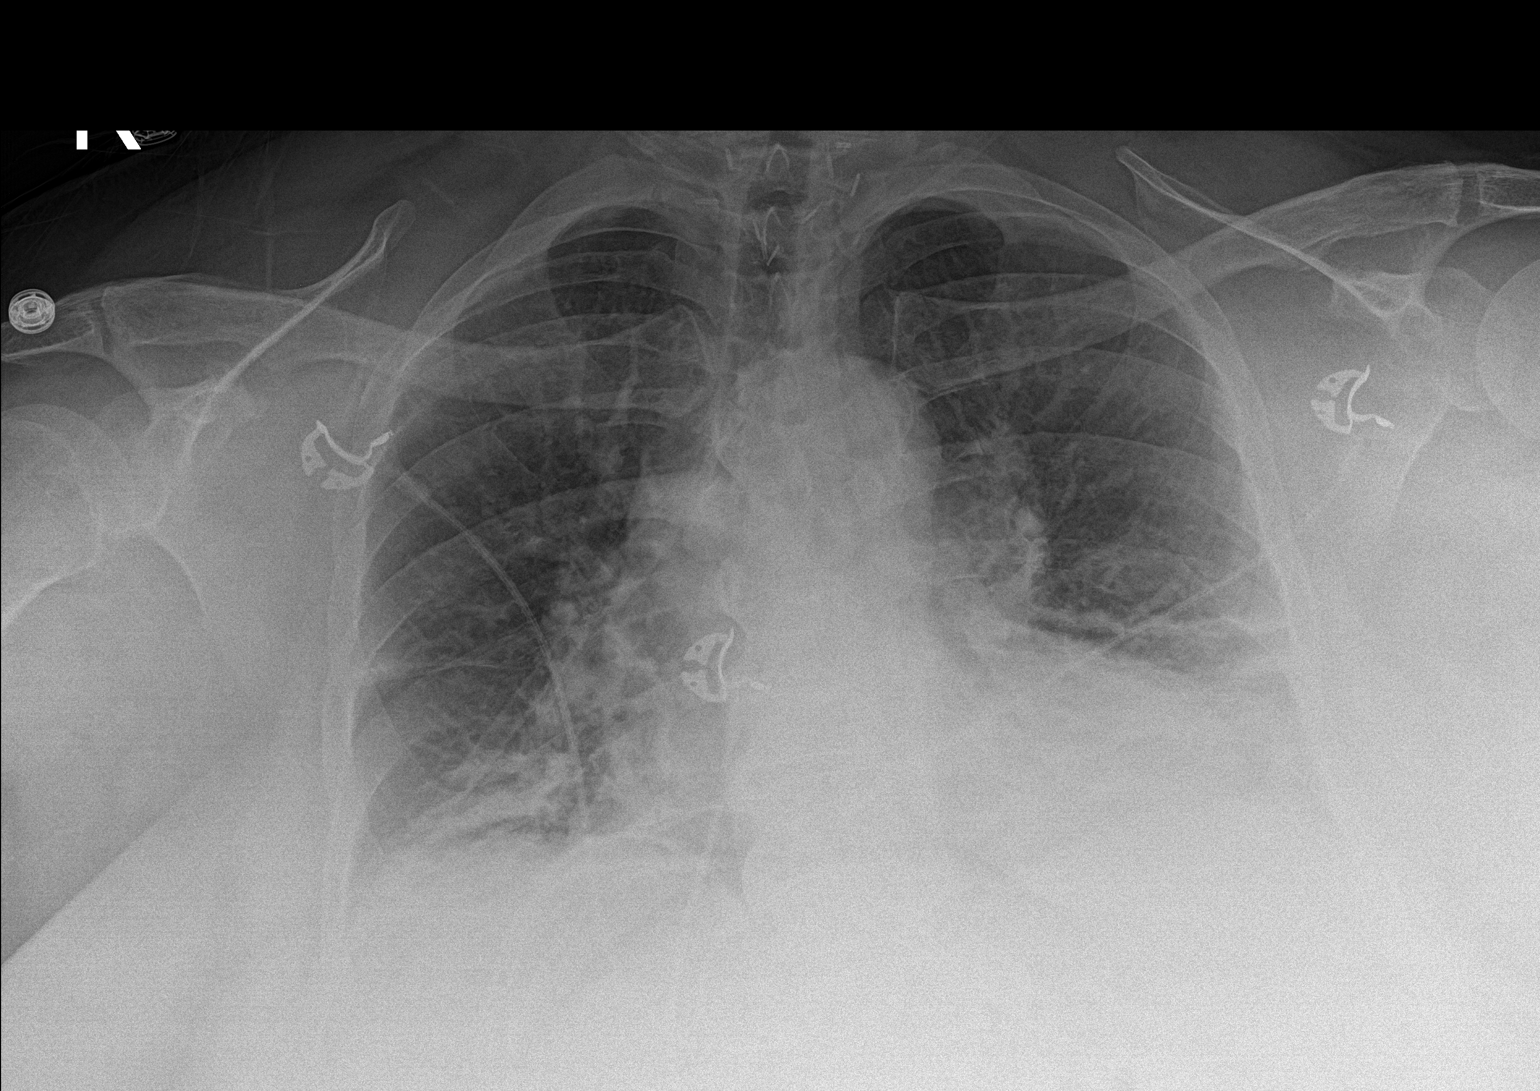

[1 of 1 positions shown; findings below may reference images not displayed]

FINDINGS: Cardiac shadow is enlarged but stable. Slight increase in vascular
congestion is noted when compare with the prior exam. Stable
bibasilar atelectatic changes are noted. Degenerative change of the
thoracic spine is seen.
IMPRESSION: Slight increase in the degree of vascular congestion.

Stable bibasilar atelectasis.

## 2021-03-15 ENCOUNTER — Telehealth: Payer: Self-pay | Admitting: Pharmacist

## 2021-03-15 ENCOUNTER — Other Ambulatory Visit: Payer: Self-pay

## 2021-03-15 ENCOUNTER — Ambulatory Visit: Payer: Self-pay

## 2021-03-15 NOTE — Patient Outreach (Signed)
03/15/2021 Name: Michele Owens MRN: [id number] DOB: February 14, 1961  Referred by: [name], MD Reason for referral : No chief complaint on file.   Called and spoke with patient on the phone and she stated now was now a good time to talk as she was mad and frustrated about a personal problem that was going on. I attempted to re-schedule but patient stated "I will call you back" but did not allow me time to provide her a phone number to re-schedule. Patient seemed apprehensive on phone to speak with me despite clarifying who I was and why I was calling and reminding patient we spoke twice on the phone to re-schedule this appointment in the past.   Follow Up Plan: The Managed Medicaid care management team will reach out to the patient again over the next 10 days.   Hughes Better PharmD, CPP High Risk Managed Medicaid [place] [phone]

## 2021-03-16 ENCOUNTER — Other Ambulatory Visit: Payer: Self-pay

## 2021-03-16 ENCOUNTER — Encounter (HOSPITAL_BASED_OUTPATIENT_CLINIC_OR_DEPARTMENT_OTHER): Payer: Self-pay | Admitting: Emergency Medicine

## 2021-03-16 ENCOUNTER — Emergency Department (HOSPITAL_BASED_OUTPATIENT_CLINIC_OR_DEPARTMENT_OTHER): Payer: Medicaid Other

## 2021-03-16 ENCOUNTER — Emergency Department (HOSPITAL_BASED_OUTPATIENT_CLINIC_OR_DEPARTMENT_OTHER)
Admission: EM | Admit: 2021-03-16 | Discharge: 2021-03-16 | Disposition: A | Discharge status: Home / Self Care | Payer: Medicaid Other | Attending: Emergency Medicine | Admitting: Emergency Medicine

## 2021-03-16 DIAGNOSIS — Z7689 Persons encountering health services in other specified circumstances: Secondary | ICD-10-CM | POA: Diagnosis not present

## 2021-03-16 DIAGNOSIS — I5032 Chronic diastolic (congestive) heart failure: Secondary | ICD-10-CM | POA: Diagnosis not present

## 2021-03-16 DIAGNOSIS — Z79899 Other long term (current) drug therapy: Secondary | ICD-10-CM | POA: Diagnosis not present

## 2021-03-16 DIAGNOSIS — R069 Unspecified abnormalities of breathing: Secondary | ICD-10-CM | POA: Diagnosis not present

## 2021-03-16 DIAGNOSIS — J45909 Unspecified asthma, uncomplicated: Secondary | ICD-10-CM | POA: Insufficient documentation

## 2021-03-16 DIAGNOSIS — E119 Type 2 diabetes mellitus without complications: Secondary | ICD-10-CM | POA: Insufficient documentation

## 2021-03-16 DIAGNOSIS — J449 Chronic obstructive pulmonary disease, unspecified: Secondary | ICD-10-CM | POA: Insufficient documentation

## 2021-03-16 DIAGNOSIS — Z7984 Long term (current) use of oral hypoglycemic drugs: Secondary | ICD-10-CM | POA: Insufficient documentation

## 2021-03-16 DIAGNOSIS — Z743 Need for continuous supervision: Secondary | ICD-10-CM | POA: Diagnosis not present

## 2021-03-16 DIAGNOSIS — Z85850 Personal history of malignant neoplasm of thyroid: Secondary | ICD-10-CM | POA: Diagnosis not present

## 2021-03-16 DIAGNOSIS — M545 Low back pain, unspecified: Secondary | ICD-10-CM | POA: Diagnosis not present

## 2021-03-16 DIAGNOSIS — R109 Unspecified abdominal pain: Secondary | ICD-10-CM | POA: Diagnosis not present

## 2021-03-16 DIAGNOSIS — I110 Hypertensive heart disease with heart failure: Secondary | ICD-10-CM | POA: Diagnosis not present

## 2021-03-16 DIAGNOSIS — R079 Chest pain, unspecified: Secondary | ICD-10-CM | POA: Diagnosis not present

## 2021-03-16 DIAGNOSIS — M5459 Other low back pain: Secondary | ICD-10-CM | POA: Diagnosis not present

## 2021-03-16 DIAGNOSIS — R1030 Lower abdominal pain, unspecified: Secondary | ICD-10-CM | POA: Diagnosis not present

## 2021-03-16 DIAGNOSIS — R059 Cough, unspecified: Secondary | ICD-10-CM | POA: Diagnosis not present

## 2021-03-16 DIAGNOSIS — Z87891 Personal history of nicotine dependence: Secondary | ICD-10-CM | POA: Diagnosis not present

## 2021-03-16 DIAGNOSIS — R5383 Other fatigue: Secondary | ICD-10-CM | POA: Diagnosis not present

## 2021-03-16 DIAGNOSIS — I10 Essential (primary) hypertension: Secondary | ICD-10-CM | POA: Diagnosis not present

## 2021-03-16 DIAGNOSIS — M791 Myalgia, unspecified site: Secondary | ICD-10-CM | POA: Diagnosis present

## 2021-03-16 DIAGNOSIS — R6889 Other general symptoms and signs: Secondary | ICD-10-CM | POA: Diagnosis not present

## 2021-03-16 DIAGNOSIS — R0789 Other chest pain: Secondary | ICD-10-CM | POA: Diagnosis not present

## 2021-03-16 DIAGNOSIS — J811 Chronic pulmonary edema: Secondary | ICD-10-CM | POA: Diagnosis not present

## 2021-03-16 LAB — CBC WITH DIFFERENTIAL/PLATELET
Abs Immature Granulocytes: 0.07 10*3/uL (ref 0.00–0.07)
Basophils Absolute: 0.1 10*3/uL (ref 0.0–0.1)
Basophils Relative: 0 %
Eosinophils Absolute: 0.4 10*3/uL (ref 0.0–0.5)
Eosinophils Relative: 3 %
HCT: 33.2 % — ABNORMAL LOW (ref 36.0–46.0)
Hemoglobin: 10.4 g/dL — ABNORMAL LOW (ref 12.0–15.0)
Immature Granulocytes: 1 %
Lymphocytes Relative: 17 %
Lymphs Abs: 2.4 10*3/uL (ref 0.7–4.0)
MCH: 29.2 pg (ref 26.0–34.0)
MCHC: 31.3 g/dL (ref 30.0–36.0)
MCV: 93.3 fL (ref 80.0–100.0)
Monocytes Absolute: 1 10*3/uL (ref 0.1–1.0)
Monocytes Relative: 7 %
Neutro Abs: 9.7 10*3/uL — ABNORMAL HIGH (ref 1.7–7.7)
Neutrophils Relative %: 72 %
Platelets: 269 10*3/uL (ref 150–400)
RBC: 3.56 MIL/uL — ABNORMAL LOW (ref 3.87–5.11)
RDW: 13.1 % (ref 11.5–15.5)
WBC: 13.6 10*3/uL — ABNORMAL HIGH (ref 4.0–10.5)
nRBC: 0 % (ref 0.0–0.2)

## 2021-03-16 LAB — COMPREHENSIVE METABOLIC PANEL
ALT: 15 U/L (ref 0–44)
AST: 22 U/L (ref 15–41)
Albumin: 4.3 g/dL (ref 3.5–5.0)
Alkaline Phosphatase: 50 U/L (ref 38–126)
Anion gap: 6 (ref 5–15)
BUN: 26 mg/dL — ABNORMAL HIGH (ref 6–20)
CO2: 39 mmol/L — ABNORMAL HIGH (ref 22–32)
Calcium: 9.3 mg/dL (ref 8.9–10.3)
Chloride: 93 mmol/L — ABNORMAL LOW (ref 98–111)
Creatinine, Ser: 0.93 mg/dL (ref 0.44–1.00)
GFR, Estimated: >60 mL/min (ref >60)
Glucose, Bld: 96 mg/dL (ref 70–99)
Potassium: 4.2 mmol/L (ref 3.5–5.1)
Sodium: 138 mmol/L (ref 135–145)
Total Bilirubin: 0.4 mg/dL (ref 0.3–1.2)
Total Protein: 7.4 g/dL (ref 6.5–8.1)

## 2021-03-16 LAB — LIPASE, BLOOD: Lipase: 11 U/L (ref 11–51)

## 2021-03-16 MED ORDER — KETOROLAC TROMETHAMINE 15 MG/ML IJ SOLN
15.0000 mg | Freq: Once | INTRAMUSCULAR | Status: AC
  Administered 2021-03-16: 15 mg via INTRAMUSCULAR
  Filled 2021-03-16: qty 1

## 2021-03-16 MED ORDER — DOXYCYCLINE HYCLATE 100 MG PO CAPS: 100.0000 mg | ORAL_CAPSULE | Freq: Two times a day (BID) | ORAL | 0 refills | Status: DC

## 2021-03-16 NOTE — ED Notes (Signed)
[facility] here to get pt , I called DR Jonelle Sidle nurse  for pt ,to let her know that Christy Sartorius was not picking up the phone , She states she will call Ms Colbaugh and infor her that he will not be able to do it today

## 2021-03-16 NOTE — ED Triage Notes (Signed)
Pt arrives via [facility] with c/o of coughing that red in it stufdf in it , was seen a week ago at [facility] for same , pt from home , pt having chest wall pain vague other issues

## 2021-03-16 NOTE — ED Provider Notes (Signed)
[facility] EMERGENCY DEPT Provider Note   CSN: [id number] Arrival date & time: 03/16/21  1242     History Chief Complaint  Patient presents with   Cough    Michele Owens is a 60 y.o. female.  48 yoF with a chief complaints of coughing.  The patient also aches all over her body.  She thinks that she has pneumonia as she had this before.  She tells me that she needs a CT scan to redemonstrate that she has pneumonia.  She is also having some lower abdominal discomfort.  Tells me that she woke up at 1 this morning and can go back to sleep and then later says that she can go back to sleep.  Because she was having discomfort.  She denies trauma denies urinary symptoms.  Was just seen in the emergency department about a week ago and was diagnosed with a possible atypical pneumonia and has completed her course of azithromycin.  The history is provided by the patient.  Cough Associated symptoms: myalgias   Associated symptoms: no chest pain, no chills, no fever, no headaches, no rhinorrhea, no shortness of breath and no wheezing   Illness Severity:  Moderate Onset quality:  Gradual Duration:  1 week Timing:  Constant Progression:  Unchanged Chronicity:  New Associated symptoms: cough and myalgias   Associated symptoms: no chest pain, no congestion, no fever, no headaches, no nausea, no rhinorrhea, no shortness of breath, no vomiting and no wheezing       Past Medical History:  Diagnosis Date   Anxiety    Arthritis    Asthma    Chronic diastolic CHF (congestive heart failure) (HCC)    COPD (chronic obstructive pulmonary disease) (HCC)    Uses Oxygen at night   Depression    Diabetes mellitus without complication (HCC)    Dyspnea    occ   Family history of thyroid cancer    GERD (gastroesophageal reflux disease)    Gout    Headache    migraines   History of DVT of lower extremity    History of nuclear stress test    Myoview 2/17:  Low risk stress nuclear study  with a small, moderate intensity, partially reversible inferior lateral defect consistent with small prior infarct and minimal peri-infarct ischemia; EF 68 with normal wall motion   Hypertension    Pneumonia    Thyroid cancer ([place]) 09/23/2016    Patient Active Problem List   Diagnosis Date Noted   Respiratory failure with hypercapnia ([place]) 12/23/2020   Anemia 12/23/2020   Acute on chronic respiratory failure with hypoxia and hypercapnia (HCC) 04/18/2020   Acute on chronic respiratory failure with hypoxia (HCC) 04/18/2020   Hemoptysis 58/52/7782   Acute metabolic encephalopathy 42/35/3614   Acute encephalopathy 04/18/2020   Chronic diastolic CHF (congestive heart failure) ([place]) 04/18/2020   Sepsis ([place]) 04/18/2020   Morbid obesity with BMI of 50.0-59.9, adult ([place])    Fall at home, initial encounter 06/23/2018   HNP (herniated nucleus pulposus), thoracic 06/23/2018   CHF (congestive heart failure) ([place]) 06/18/2018   Hypertensive disorder 08/28/2017   Obesity 08/28/2017   COPD exacerbation ([place]) 08/04/2017   Acute and chronic respiratory failure with hypercapnia (HCC)    Pulmonary HTN ([place])    Genetic testing 02/27/2017   Family history of thyroid cancer    Hypomagnesemia 10/03/2016   Atypical chest pain    HLD (hyperlipidemia) 09/23/2016   Gout 09/23/2016   DVT (deep vein thrombosis) in pregnancy 09/23/2016  Thyroid cancer ([place]) 09/23/2016   Hypocalcemia 09/23/2016   AKI (acute kidney injury) ([place]) 09/23/2016   Acute pulmonary edema ([place])    Acute hypoxemic respiratory failure (HCC)    Ventilator dependent ([place])    Chronic respiratory failure with hypoxia and hypercapnia ([place]) 07/16/2016   CAP (community acquired pneumonia) 01/23/2016   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 01/23/2016   Pulmonary hypertension ([place]) 01/23/2016   Obesity hypoventilation syndrome ([place]) 08/26/2015   Dyslipidemia associated with type 2 diabetes mellitus ([place]) 04/24/2015    Leukocytosis 04/24/2015   Controlled type 2 diabetes mellitus without complication, without long-term current use of insulin ([place]) 04/24/2015   Anxiety 04/24/2015   Benign essential HTN 04/24/2015   Normocytic anemia 04/24/2015   OSA (obstructive sleep apnea) 05/10/2012   Tobacco abuse 07/04/2011    Past Surgical History:  Procedure Laterality Date   BUNIONECTOMY Bilateral    CARDIAC CATHETERIZATION N/A 06/23/2015   Procedure: Right/Left Heart Cath and Coronary Angiography;  Surgeon: [name], MD;  Location: [facility];  Service: Cardiovascular;  Laterality: N/A;   COLONOSCOPY WITH PROPOFOL N/A 12/15/2015   Procedure: COLONOSCOPY WITH PROPOFOL;  Surgeon: [name], MD;  Location: [facility];  Service: Endoscopy;  Laterality: N/A;   RIGHT HEART CATH N/A 10/01/2019   Procedure: RIGHT HEART CATH;  Surgeon: [name], MD;  Location: [facility];  Service: Cardiovascular;  Laterality: N/A;   THYROIDECTOMY  09/19/2016   THYROIDECTOMY N/A 09/19/2016   Procedure: TOTAL THYROIDECTOMY;  Surgeon: [name], MD;  Location: [facility];  Service: General;  Laterality: N/A;   TONSILLECTOMY     TOTAL ABDOMINAL HYSTERECTOMY  07/14/10     OB History   No obstetric history on file.     Family History  Problem Relation Age of Onset   Cancer Father        thought to be due to exposure to concrete   Diabetes Mother    Thyroid cancer Mother        dx in her 77s-60s   Cancer Maternal Uncle        2 uncles with cancer NOS   Brain cancer Paternal Aunt    Cancer Cousin        maternal first cousin - NOS   Cancer Cousin        maternal first cousin - NOS    Social History   Tobacco Use   Smoking status: Former    Packs/day: 0.50    Years: 41.00    Pack years: 20.50    Types: Cigarettes    Quit date: 07/2016    Years since quitting: 4.6   Smokeless tobacco: Never   Tobacco comments:    Starting to [employer] -- using Nicotine Patch  Vaping Use   Vaping Use: Never  used  Substance Use Topics   Alcohol use: Not Currently    Alcohol/week: 0.0 standard drinks    Comment: ? none now   Drug use: Never    Comment: none for long time    Home Medications Prior to Admission medications   Medication Sig Start Date End Date Taking? Authorizing Provider  doxycycline (VIBRAMYCIN) 100 MG capsule Take 1 capsule (100 mg total) by mouth 2 (two) times daily. One po bid x 7 days 03/16/21  Yes Deno Etienne, DO  albuterol (PROVENTIL HFA;VENTOLIN HFA) 108 (90 Base) MCG/ACT inhaler Inhale 1-2 puffs into the lungs every 6 (six) hours as needed for wheezing or shortness of  breath. 04/23/18   Long, [name], MD  allopurinol (ZYLOPRIM) 100 MG tablet Take 100 mg by mouth daily.    [provider]  aspirin EC 81 MG tablet Take 81 mg by mouth daily. Swallow whole.    [provider]  azithromycin (ZITHROMAX Z-PAK) 250 MG tablet 2 po day one, then 1 daily x 4 days 03/09/21   Drenda Freeze, MD  benzonatate (TESSALON) 200 MG capsule Take 1 capsule (200 mg total) by mouth in the morning, at noon, and at bedtime. 7 DS Patient taking differently: Take 200 mg by mouth 3 (three) times daily as needed for cough. 04/13/20   Regalado, Belkys A, MD  blood glucose meter kit and supplies KIT Dispense based on patient and insurance preference. Use up to four times daily as directed. 12/25/20   Little Ishikawa, MD  busPIRone (BUSPAR) 5 MG tablet Take 5 mg by mouth 3 (three) times daily as needed (anxiety).    [provider]  Butalbital-APAP-Caffeine 50-300-40 MG CAPS Take 1 capsule by mouth 2 (two) times daily as needed for headache.    [provider]  calcitRIOL (ROCALTROL) 0.5 MCG capsule Take 1 capsule (0.5 mcg total) by mouth daily. 09/26/16   Hongalgi, [name], MD  carvedilol (COREG) 6.25 MG tablet Take 1 tablet (6.25 mg total) by mouth 2 (two) times daily with a meal. 01/23/18   Bensimhon, [name], MD  clonazePAM (KLONOPIN) 2 MG tablet Take 1 tablet (2  mg total) by mouth 3 (three) times daily. for anxiety Patient taking differently: Take 2 mg by mouth 3 (three) times daily. 06/23/19   [name], MD  cyclobenzaprine (FLEXERIL) 10 MG tablet Take 10 mg by mouth 3 (three) times daily. 11/19/19   [provider]  diclofenac sodium (VOLTAREN) 1 % GEL Apply 2 g topically 4 (four) times daily as needed (for pain). 06/19/18   [name], MD  EPINEPHrine 0.3 mg/0.3 mL IJ SOAJ injection Inject 0.3 mg into the muscle once as needed for anaphylaxis.    [provider]  FEROSUL 325 (65 Fe) MG tablet Take 325 mg by mouth daily. 03/22/17   [provider]  glucose blood test strip Use as instructed to check blood sugar up to 4 times daily Patient taking differently: 1 each by Other route See admin instructions. Use as instructed to check blood sugar up to 4 times daily 08/06/17   [name], DO  ipratropium-albuterol (DUONEB) 0.5-2.5 (3) MG/3ML SOLN Take 3 mLs by nebulization 4 (four) times daily.  04/05/17   [provider]  levothyroxine (SYNTHROID, LEVOTHROID) 112 MCG tablet Take 224 mcg by mouth daily before breakfast. 07/11/17   [provider]  lisinopril (PRINIVIL,ZESTRIL) 5 MG tablet Take 1 tablet (5 mg total) by mouth daily. Please call for office visit [phone] Patient taking differently: Take 5 mg by mouth daily. 08/15/17   [name], MD  metFORMIN (GLUCOPHAGE) 500 MG tablet Take 1 tablet (500 mg total) by mouth 2 (two) times daily with a meal. 12/11/14   [name], MD  nitroGLYCERIN (NITROSTAT) 0.4 MG SL tablet Place 1 tablet (0.4 mg total) under the tongue every 5 (five) minutes as needed for chest pain. 06/19/18   [name], MD  ondansetron (ZOFRAN-ODT) 4 MG disintegrating tablet 53m ODT q4 hours prn nausea/vomit 03/09/21   YDrenda Freeze MD  oxyCODONE-acetaminophen (PERCOCET) 10-325 MG tablet Take 1 tablet by mouth every 6 (six) hours as needed for  pain. 12/03/19    [provider]  OXYGEN Inhale 4 L into the lungs continuous.    [provider]  potassium chloride SA (K-DUR,KLOR-CON) 20 MEQ tablet Take 1 tablet (20 mEq total) by mouth 2 (two) times daily. 07/24/18   [name], MD  predniSONE (DELTASONE) 20 MG tablet Take 2 tablets (40 mg total) by mouth daily. Patient not taking: No sig reported 11/11/20   Horton, Barbette Hair, MD  rosuvastatin (CRESTOR) 10 MG tablet Take 10 mg by mouth every evening. 11/19/19   [provider]  senna-docusate (SENOKOT-S) 8.6-50 MG tablet Take 1 tablet by mouth daily.    [provider]  torsemide (DEMADEX) 20 MG tablet Take 2 tablets (40 mg total) by mouth 2 (two) times daily. 12/17/20   [name], MD  traZODone (DESYREL) 50 MG tablet Take 100 mg by mouth at bedtime. 11/19/19   [provider]  triamcinolone cream (KENALOG) 0.1 % Apply 1 application topically 2 (two) times daily. 09/10/20   [provider]  umeclidinium-vilanterol (ANORO ELLIPTA) 62.5-25 MCG/INH AEPB Inhale 1 puff into the lungs daily.    [provider]    Allergies    Bee venom, Fioricet [butalbital-apap-caffeine], Ibuprofen, Lamisil [terbinafine], and Nsaids  Review of Systems   Review of Systems  Constitutional:  Negative for chills and fever.  HENT:  Negative for congestion and rhinorrhea.   Eyes:  Negative for redness and visual disturbance.  Respiratory:  Positive for cough. Negative for shortness of breath and wheezing.   Cardiovascular:  Negative for chest pain and palpitations.  Gastrointestinal:  Negative for nausea and vomiting.  Genitourinary:  Negative for dysuria and urgency.  Musculoskeletal:  Positive for arthralgias and myalgias.  Skin:  Negative for pallor and wound.  Neurological:  Negative for dizziness and headaches.   Physical Exam Updated Vital Signs BP 114/66   Pulse 93   Temp 98.9 F (37.2 C) (Oral)   Resp 20   Ht 5' 5" (1.651 m)   Wt 136 kg   SpO2  99%   BMI 49.89 kg/m   Physical Exam Vitals and nursing note reviewed.  Constitutional:      General: She is not in acute distress.    Appearance: She is well-developed. She is not diaphoretic.     Comments: BMI 50  HENT:     Head: Normocephalic and atraumatic.  Eyes:     Pupils: Pupils are equal, round, and reactive to light.  Cardiovascular:     Rate and Rhythm: Normal rate and regular rhythm.     Heart sounds: No murmur heard.   No friction rub. No gallop.  Pulmonary:     Effort: Pulmonary effort is normal.     Breath sounds: No wheezing or rales.  Abdominal:     General: There is no distension.     Palpations: Abdomen is soft.     Tenderness: There is no abdominal tenderness.  Musculoskeletal:        General: No tenderness.     Cervical back: Normal range of motion and neck supple.  Skin:    General: Skin is warm and dry.  Neurological:     Mental Status: She is alert and oriented to person, place, and time.  Psychiatric:        Behavior: Behavior normal.    ED Results / Procedures / Treatments   Labs (all labs ordered are listed, but only abnormal results are displayed) Labs Reviewed  CBC WITH  DIFFERENTIAL/PLATELET - Abnormal; Notable for the following components:      Result Value   WBC 13.6 (*)    RBC 3.56 (*)    Hemoglobin 10.4 (*)    HCT 33.2 (*)    Neutro Abs 9.7 (*)    All other components within normal limits  COMPREHENSIVE METABOLIC PANEL - Abnormal; Notable for the following components:   Chloride 93 (*)    CO2 39 (*)    BUN 26 (*)    All other components within normal limits  LIPASE, BLOOD    EKG EKG Interpretation  Date/Time:  Wednesday March 16 2021 13:06:39 EST Ventricular Rate:  91 PR Interval:  168 QRS Duration: 85 QT Interval:  375 QTC Calculation: 462 R Axis:   52 Text Interpretation: Sinus rhythm Low voltage, precordial leads No significant change since last tracing Confirmed by Deno Etienne [phone]) on 03/16/2021 2:23:27  PM  Radiology DG Chest Port 1 View  Result Date: 03/16/2021 CLINICAL DATA:  Cough, fatigue EXAM: PORTABLE CHEST 1 VIEW COMPARISON:  03/09/2021 FINDINGS: Low lung volumes. Pulmonary vascular congestion. Bibasilar atelectasis/consolidation. No significant pleural effusion. No pneumothorax. Similar cardiomediastinal contours. IMPRESSION: Pulmonary vascular congestion and bibasilar atelectasis/consolidation. Electronically Signed   By: Macy Mis M.D.   On: 03/16/2021 13:55   CT Renal Stone Study  Result Date: 03/16/2021 CLINICAL DATA:  Flank pain, kidney stone suspected. EXAM: CT ABDOMEN AND PELVIS WITHOUT CONTRAST TECHNIQUE: Multidetector CT imaging of the abdomen and pelvis was performed following the standard protocol without IV contrast. COMPARISON:  CTA chest and CT abdomen and pelvis 03/09/2021 FINDINGS: Lower chest: Partially visualized patchy consolidation in both lung bases, similar to the recent prior chest CTA. No pleural effusion. Hepatobiliary: Hepatomegaly and hepatic steatosis. Unremarkable gallbladder. No biliary dilatation. Pancreas: Unremarkable. Spleen: Unremarkable. Adrenals/Urinary Tract: Unremarkable adrenal glands. No evidence of renal mass, calculi, or hydronephrosis. Unremarkable bladder. Stomach/Bowel: The stomach is unremarkable. There is no evidence of bowel obstruction or inflammation. The appendix is unremarkable. Vascular/Lymphatic: Mild abdominal aortic atherosclerosis without aneurysm. No enlarged lymph nodes. Reproductive: Status post hysterectomy. Grossly unremarkable ovaries. Other: No ascites or pneumoperitoneum. Musculoskeletal: No acute osseous abnormality or suspicious osseous lesion. IMPRESSION: 1. No evidence of urinary tract calculi, obstruction, or other acute abnormality in the abdomen or pelvis. 2. Hepatomegaly and hepatic steatosis. 3. Persistent patchy consolidation in the lung bases. 4. Aortic Atherosclerosis (ICD10-I70.0). Electronically Signed   By: Logan Bores M.D.   On: 03/16/2021 14:33    Procedures Procedures   Medications Ordered in ED Medications  ketorolac (TORADOL) 15 MG/ML injection 15 mg (15 mg Intramuscular Given 03/16/21 1349)    ED Course  I have reviewed the triage vital signs and the nursing notes.  Pertinent labs & imaging results that were available during my care of the patient were reviewed by me and considered in my medical decision making (see chart for details).    MDM Rules/Calculators/A&P                           60 yo F with multiple complaints.  She was just seen in the ED about a week ago and was diagnosed by CT scan of possible atypical pneumonia.  She could have a viral syndrome or perhaps the patient has developed some muscular back pain.  She without obvious focality on my exam on abdominal exam.  Clear lung sounds for me.  100% on her chronic 2 L of oxygen at home.  We will obtain a chest x-ray blood work CT of the abdomen pelvis.  Work-up here without obvious laboratory abnormality.  No significant anemia no significant electrolyte abnormality.  Chest x-ray radiology reading as possible edema versus atypical infection or consolidation at the bases.  CT scan also consistent with similar changes though not changed from prior.  Of note whenever the patient had had CT scans before she had similar reads.  Perhaps this is how the patient presents with pneumonia or this could be a chronic finding.  She is well-appearing has no difficulty breathing.  Is only tachypneic when she is trying to rapidly speak.  I do not feel like she needs hospitalization at this point.  We will try a course of doxycycline in case there is something that was not covered by azithromycin.  We will have her follow-up with her family doctor in the office.  2:56 PM:  I have discussed the diagnosis/risks/treatment options with the patient and believe the pt to be eligible for discharge home to follow-up with PCP. We also discussed returning to  the ED immediately if new or worsening sx occur. We discussed the sx which are most concerning (e.g., sudden worsening pain, fever, inability to tolerate by mouth) that necessitate immediate return. Medications administered to the patient during their visit and any new prescriptions provided to the patient are listed below.  Medications given during this visit Medications  ketorolac (TORADOL) 15 MG/ML injection 15 mg (15 mg Intramuscular Given 03/16/21 1349)     The patient appears reasonably screen and/or stabilized for discharge and I doubt any other medical condition or other [facility] requiring further screening, evaluation, or treatment in the ED at this time prior to discharge.   Final Clinical Impression(s) / ED Diagnoses Final diagnoses:  Myalgia  Acute bilateral low back pain, unspecified whether sciatica present    Rx / DC Orders ED Discharge Orders          Ordered    doxycycline (VIBRAMYCIN) 100 MG capsule  2 times daily        03/16/21 [place], , DO 03/16/21 1456

## 2021-03-16 NOTE — ED Notes (Signed)
[facility] here to get pt , pt sitting on side of bed  in chair , after telling me she was NOT going to get up, pt still on O2   2 LNC

## 2021-03-16 NOTE — ED Notes (Signed)
Pt is aware that she is going home states she feels a little better, states she doesn't have a ride home and [facility] has been called for transport, pt wanted me to call a VIctor to go and pick up her rx because she cannot get them [phone] but there was  no answer, pt states she does not get along with family and has no one to pick her up

## 2021-03-16 NOTE — ED Notes (Signed)
Pt assisted up to BR she ambulated to BR with cane and one tech,

## 2021-03-16 NOTE — Discharge Instructions (Signed)
Your CT scan did not show any acute abnormality inside your abdomen.  They are still commenting that the lower portion of your lungs could be an atypical infection.  You could have a viral illness its continuing to make you feel unwell.  I prescribed you an antibiotic that would cover for atypical bacteria if the Z-Pak did not cover it.  Please follow-up with your family doctor in the office.

## 2021-03-21 ENCOUNTER — Other Ambulatory Visit: Payer: Self-pay | Admitting: Obstetrics and Gynecology

## 2021-03-21 ENCOUNTER — Other Ambulatory Visit: Payer: Self-pay

## 2021-03-21 DIAGNOSIS — J449 Chronic obstructive pulmonary disease, unspecified: Secondary | ICD-10-CM | POA: Diagnosis not present

## 2021-03-21 NOTE — Patient Outreach (Signed)
Care Coordination  03/21/2021  Michele Owens [birth year] [id number]  RNCM returned patient's phone call.  Patient provided update on aide status.  [name] RN, BSN [name]  [facility] - Managed Medicaid High Risk [phone].

## 2021-03-25 ENCOUNTER — Other Ambulatory Visit: Payer: Self-pay | Admitting: Obstetrics and Gynecology

## 2021-03-25 NOTE — Patient Outreach (Signed)
Care Coordination  03/25/2021  Michele Owens [birth year] [id number]   Medicaid Managed Care   Unsuccessful Outreach Note  03/25/2021 Name: Michele Owens MRN: [id number] DOB: 11-20-1960  Referred by: [name], MD Reason for referral : High Risk Managed Medicaid (Unsuccessful telephone outreach)   An unsuccessful telephone outreach was attempted today. The patient was referred to the case management team for assistance with care management and care coordination.   Follow Up Plan: The care management team will reach out to the patient again over the next 7-14 days.   [name] RN, BSN [place]   [facility] [employer] - Managed Medicaid High Risk [phone].

## 2021-03-25 NOTE — Patient Instructions (Signed)
Visit Information  Ms. [name]  - as a part of your Medicaid benefit, you are eligible for care management and care coordination services at no cost or copay. I was unable to reach you by phone today but would be happy to help you with your health related needs. Please feel free to call me at [phone].  A member of the Managed Medicaid care management team will reach out to you again over the next 7-14 days.   [name] RN, BSN [place]   [facility] - Managed Medicaid High Risk [phone].

## 2021-04-06 ENCOUNTER — Other Ambulatory Visit: Payer: Self-pay

## 2021-04-06 ENCOUNTER — Other Ambulatory Visit: Payer: Self-pay | Admitting: Obstetrics and Gynecology

## 2021-04-06 NOTE — Patient Outreach (Signed)
Care Coordination  04/06/2021  Ahmyah Gidley 1960/06/10 [id number]  RNCM returned patient's phone call-no answer, left message.  [name] RN, BSN [place]   [facility] - Managed Medicaid High Risk [phone]

## 2021-04-07 ENCOUNTER — Other Ambulatory Visit: Payer: Self-pay | Admitting: Obstetrics and Gynecology

## 2021-04-07 NOTE — Patient Outreach (Signed)
Care Coordination  04/07/2021  Michele Owens [birth year] [id number]   Medicaid Managed Care   Unsuccessful Outreach Note  04/07/2021 Name: Michele Owens MRN: [id number] DOB: [birth year]  Referred by: [name], MD Reason for referral : High Risk Managed Medicaid (Unsuccessful telephone outreach)   An unsuccessful telephone outreach was attempted today. The patient was referred to the case management team for assistance with care management and care coordination.   Follow Up Plan: The care management team will reach out to the patient again over the next 7-14 days.  [name] RN, BSN [place]   [employer] - Managed Medicaid High Risk [phone].

## 2021-04-07 NOTE — Patient Instructions (Signed)
Visit Information  Ms. Michele Owens  - as a part of your Medicaid benefit, you are eligible for care management and care coordination services at no cost or copay. I was unable to reach you by phone today but would be happy to help you with your health related needs. Please feel free to call me at [phone].  A member of the Managed Medicaid care management team will reach out to you again over the next 7-14 days.   [name] RN, BSN [place]   [employer] - Managed Medicaid High Risk [phone].

## 2021-04-11 DIAGNOSIS — I110 Hypertensive heart disease with heart failure: Secondary | ICD-10-CM | POA: Diagnosis not present

## 2021-04-12 DIAGNOSIS — I110 Hypertensive heart disease with heart failure: Secondary | ICD-10-CM | POA: Diagnosis not present

## 2021-04-13 DIAGNOSIS — I110 Hypertensive heart disease with heart failure: Secondary | ICD-10-CM | POA: Diagnosis not present

## 2021-04-14 DIAGNOSIS — I110 Hypertensive heart disease with heart failure: Secondary | ICD-10-CM | POA: Diagnosis not present

## 2021-04-15 DIAGNOSIS — I110 Hypertensive heart disease with heart failure: Secondary | ICD-10-CM | POA: Diagnosis not present

## 2021-04-18 DIAGNOSIS — I110 Hypertensive heart disease with heart failure: Secondary | ICD-10-CM | POA: Diagnosis not present

## 2021-04-19 DIAGNOSIS — I110 Hypertensive heart disease with heart failure: Secondary | ICD-10-CM | POA: Diagnosis not present

## 2021-04-20 ENCOUNTER — Other Ambulatory Visit: Payer: Self-pay | Admitting: Obstetrics and Gynecology

## 2021-04-20 DIAGNOSIS — I110 Hypertensive heart disease with heart failure: Secondary | ICD-10-CM | POA: Diagnosis not present

## 2021-04-20 NOTE — Patient Outreach (Signed)
Care Coordination  04/20/2021  Michele Owens 1960/05/22 [id number]   Medicaid Managed Care   Unsuccessful Outreach Note  04/20/2021 Name: Michele Owens MRN: [id number] DOB: Nov 24, 1960  Referred by: [name], MD Reason for referral : High Risk Managed Medicaid (Unsuccessful telephone outreach)   A second unsuccessful telephone outreach was attempted today. The patient was referred to the case management team for assistance with care management and care coordination.   Follow Up Plan: The care management team will reach out to the patient again over the next 7-14 days.   [name] RN, BSN [place]   [employer] - Managed Medicaid High Risk [phone].

## 2021-04-20 NOTE — Patient Instructions (Signed)
Visit Information  Ms. Michele Owens  - as a part of your Medicaid benefit, you are eligible for care management and care coordination services at no cost or copay. I was unable to reach you by phone today but would be happy to help you with your health related needs. Please feel free to call me at [phone].  A member of the Managed Medicaid care management team will reach out to you again over the next 7 -14 days.   [name] RN, BSN [place]   [facility] - Managed Medicaid High Risk [phone].

## 2021-04-21 DIAGNOSIS — I110 Hypertensive heart disease with heart failure: Secondary | ICD-10-CM | POA: Diagnosis not present

## 2021-04-21 DIAGNOSIS — J449 Chronic obstructive pulmonary disease, unspecified: Secondary | ICD-10-CM | POA: Diagnosis not present

## 2021-04-22 DIAGNOSIS — I110 Hypertensive heart disease with heart failure: Secondary | ICD-10-CM | POA: Diagnosis not present

## 2021-04-23 DIAGNOSIS — Z743 Need for continuous supervision: Secondary | ICD-10-CM | POA: Diagnosis not present

## 2021-04-23 DIAGNOSIS — R531 Weakness: Secondary | ICD-10-CM | POA: Diagnosis not present

## 2021-04-23 DIAGNOSIS — R0789 Other chest pain: Secondary | ICD-10-CM | POA: Diagnosis not present

## 2021-04-23 DIAGNOSIS — R079 Chest pain, unspecified: Secondary | ICD-10-CM | POA: Diagnosis not present

## 2021-04-27 ENCOUNTER — Emergency Department (HOSPITAL_BASED_OUTPATIENT_CLINIC_OR_DEPARTMENT_OTHER): Payer: Medicaid Other

## 2021-04-27 ENCOUNTER — Emergency Department (HOSPITAL_BASED_OUTPATIENT_CLINIC_OR_DEPARTMENT_OTHER)
Admission: EM | Admit: 2021-04-27 | Discharge: 2021-04-28 | Disposition: A | Discharge status: Home / Self Care | Payer: Medicaid Other | Attending: Emergency Medicine | Admitting: Emergency Medicine

## 2021-04-27 ENCOUNTER — Encounter (HOSPITAL_BASED_OUTPATIENT_CLINIC_OR_DEPARTMENT_OTHER): Payer: Self-pay

## 2021-04-27 ENCOUNTER — Other Ambulatory Visit: Payer: Self-pay

## 2021-04-27 DIAGNOSIS — I10 Essential (primary) hypertension: Secondary | ICD-10-CM | POA: Diagnosis not present

## 2021-04-27 DIAGNOSIS — Z743 Need for continuous supervision: Secondary | ICD-10-CM | POA: Diagnosis not present

## 2021-04-27 DIAGNOSIS — Z7901 Long term (current) use of anticoagulants: Secondary | ICD-10-CM | POA: Diagnosis not present

## 2021-04-27 DIAGNOSIS — Z79899 Other long term (current) drug therapy: Secondary | ICD-10-CM | POA: Insufficient documentation

## 2021-04-27 DIAGNOSIS — J449 Chronic obstructive pulmonary disease, unspecified: Secondary | ICD-10-CM | POA: Diagnosis not present

## 2021-04-27 DIAGNOSIS — S8392XA Sprain of unspecified site of left knee, initial encounter: Secondary | ICD-10-CM | POA: Insufficient documentation

## 2021-04-27 DIAGNOSIS — I509 Heart failure, unspecified: Secondary | ICD-10-CM | POA: Insufficient documentation

## 2021-04-27 DIAGNOSIS — I499 Cardiac arrhythmia, unspecified: Secondary | ICD-10-CM | POA: Diagnosis not present

## 2021-04-27 DIAGNOSIS — Y92009 Unspecified place in unspecified non-institutional (private) residence as the place of occurrence of the external cause: Secondary | ICD-10-CM | POA: Diagnosis not present

## 2021-04-27 DIAGNOSIS — S8992XA Unspecified injury of left lower leg, initial encounter: Secondary | ICD-10-CM | POA: Diagnosis present

## 2021-04-27 DIAGNOSIS — R609 Edema, unspecified: Secondary | ICD-10-CM | POA: Diagnosis not present

## 2021-04-27 DIAGNOSIS — Z7984 Long term (current) use of oral hypoglycemic drugs: Secondary | ICD-10-CM | POA: Insufficient documentation

## 2021-04-27 DIAGNOSIS — M25462 Effusion, left knee: Secondary | ICD-10-CM | POA: Diagnosis not present

## 2021-04-27 DIAGNOSIS — E119 Type 2 diabetes mellitus without complications: Secondary | ICD-10-CM | POA: Insufficient documentation

## 2021-04-27 DIAGNOSIS — Z7982 Long term (current) use of aspirin: Secondary | ICD-10-CM | POA: Diagnosis not present

## 2021-04-27 DIAGNOSIS — I959 Hypotension, unspecified: Secondary | ICD-10-CM | POA: Diagnosis not present

## 2021-04-27 DIAGNOSIS — W19XXXA Unspecified fall, initial encounter: Secondary | ICD-10-CM | POA: Diagnosis not present

## 2021-04-27 DIAGNOSIS — Z9981 Dependence on supplemental oxygen: Secondary | ICD-10-CM | POA: Insufficient documentation

## 2021-04-27 DIAGNOSIS — S838X2A Sprain of other specified parts of left knee, initial encounter: Secondary | ICD-10-CM | POA: Diagnosis not present

## 2021-04-27 LAB — URINALYSIS, ROUTINE W REFLEX MICROSCOPIC
Bilirubin Urine: NEGATIVE
Glucose, UA: NEGATIVE mg/dL
Hgb urine dipstick: NEGATIVE
Ketones, ur: NEGATIVE mg/dL
Leukocytes,Ua: NEGATIVE
Nitrite: NEGATIVE
Protein, ur: NEGATIVE mg/dL
Specific Gravity, Urine: 1.012 (ref 1.005–1.030)
pH: 6 (ref 5.0–8.0)

## 2021-04-27 LAB — BASIC METABOLIC PANEL
Anion gap: 6 (ref 5–15)
BUN: 19 mg/dL (ref 8–23)
CO2: 36 mmol/L — ABNORMAL HIGH (ref 22–32)
Calcium: 9.2 mg/dL (ref 8.9–10.3)
Chloride: 98 mmol/L (ref 98–111)
Creatinine, Ser: 0.91 mg/dL (ref 0.44–1.00)
GFR, Estimated: >60 mL/min (ref >60)
Glucose, Bld: 89 mg/dL (ref 70–99)
Potassium: 4.2 mmol/L (ref 3.5–5.1)
Sodium: 140 mmol/L (ref 135–145)

## 2021-04-27 LAB — CBC WITH DIFFERENTIAL/PLATELET
Abs Immature Granulocytes: 0.07 10*3/uL (ref 0.00–0.07)
Basophils Absolute: 0 10*3/uL (ref 0.0–0.1)
Basophils Relative: 0 %
Eosinophils Absolute: 0.3 10*3/uL (ref 0.0–0.5)
Eosinophils Relative: 2 %
HCT: 33.7 % — ABNORMAL LOW (ref 36.0–46.0)
Hemoglobin: 10.2 g/dL — ABNORMAL LOW (ref 12.0–15.0)
Immature Granulocytes: 1 %
Lymphocytes Relative: 20 %
Lymphs Abs: 2.6 10*3/uL (ref 0.7–4.0)
MCH: 28.2 pg (ref 26.0–34.0)
MCHC: 30.3 g/dL (ref 30.0–36.0)
MCV: 93.1 fL (ref 80.0–100.0)
Monocytes Absolute: 1 10*3/uL (ref 0.1–1.0)
Monocytes Relative: 8 %
Neutro Abs: 8.9 10*3/uL — ABNORMAL HIGH (ref 1.7–7.7)
Neutrophils Relative %: 69 %
Platelets: 316 10*3/uL (ref 150–400)
RBC: 3.62 MIL/uL — ABNORMAL LOW (ref 3.87–5.11)
RDW: 13.4 % (ref 11.5–15.5)
WBC: 12.9 10*3/uL — ABNORMAL HIGH (ref 4.0–10.5)
nRBC: 0 % (ref 0.0–0.2)

## 2021-04-27 LAB — CK: Total CK: 59 U/L (ref 38–234)

## 2021-04-27 MED ORDER — DICLOFENAC SODIUM 1 % TD GEL: 4.0000 g | Freq: Four times a day (QID) | TRANSDERMAL | 0 refills | Status: DC | PRN

## 2021-04-27 MED ORDER — KETOROLAC TROMETHAMINE 30 MG/ML IJ SOLN
30.0000 mg | Freq: Once | INTRAMUSCULAR | Status: AC
  Administered 2021-04-27: 30 mg via INTRAMUSCULAR
  Filled 2021-04-27: qty 1

## 2021-04-27 NOTE — Discharge Instructions (Addendum)
You are seen in the ER after he had a fall and resultant knee injury.  X-ray does not show any fracture. Continue taking your chronic pain medication.  Apply the topical ointment that has been prescribed.  Follow-up with your doctor in 1 week if the symptoms are not improving.

## 2021-04-27 NOTE — ED Provider Notes (Signed)
[facility] EMERGENCY DEPT Provider Note   CSN: [id number] Arrival date & time: 04/27/21  1657     History  Chief Complaint  Patient presents with   Knee Pain    Michele Owens is a 61 y.o. female.  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  Patient indicates that her legs gave out yesterday and she had a fall in her home.  She was able to crawl and get to her bed thereafter.  She continues to have knee pain however today and is having difficulty ambulating.  Normally she uses a walker only when she is outside her home.  She lives by herself.  She has been taking her home pain medication with minimal relief.  She denies injury elsewhere.  Specifically, no headaches, no new numbness, tingling, weakness, nausea, vomiting, confusion, neck pain.  Home Medications Prior to Admission medications   Medication Sig Start Date End Date Taking? Authorizing Provider  albuterol (PROVENTIL HFA;VENTOLIN HFA) 108 (90 Base) MCG/ACT inhaler Inhale 1-2 puffs into the lungs every 6 (six) hours as needed for wheezing or shortness of breath. 04/23/18   Long, [name], MD  allopurinol (ZYLOPRIM) 100 MG tablet Take 100 mg by mouth daily.    [provider]  aspirin EC 81 MG tablet Take 81 mg by mouth daily. Swallow whole.    [provider]  azithromycin (ZITHROMAX Z-PAK) 250 MG tablet 2 po day one, then 1 daily x 4 days 03/09/21   Drenda Freeze, MD  benzonatate (TESSALON) 200 MG capsule Take 1 capsule (200 mg total) by mouth in the morning, at noon, and at bedtime. 7 DS Patient taking differently: Take 200 mg by mouth 3 (three) times daily as needed for cough. 04/13/20   Regalado, Belkys A, MD  blood glucose meter kit and supplies KIT Dispense based on patient and insurance preference. Use up to four times daily as directed. 12/25/20   Little Ishikawa, MD   busPIRone (BUSPAR) 5 MG tablet Take 5 mg by mouth 3 (three) times daily as needed (anxiety).    [provider]  Butalbital-APAP-Caffeine 50-300-40 MG CAPS Take 1 capsule by mouth 2 (two) times daily as needed for headache.    [provider]  calcitRIOL (ROCALTROL) 0.5 MCG capsule Take 1 capsule (0.5 mcg total) by mouth daily. 09/26/16   Hongalgi, [name], MD  carvedilol (COREG) 6.25 MG tablet Take 1 tablet (6.25 mg total) by mouth 2 (two) times daily with a meal. 01/23/18   Bensimhon, [name], MD  clonazePAM (KLONOPIN) 2 MG tablet Take 1 tablet (2 mg total) by mouth 3 (three) times daily. for anxiety Patient taking differently: Take 2 mg by mouth 3 (three) times daily. 06/23/19   [name], MD  cyclobenzaprine (FLEXERIL) 10 MG tablet Take 10 mg by mouth 3 (three) times daily. 11/19/19   [provider]  diclofenac sodium (VOLTAREN) 1 % GEL Apply 4 g topically 4 (four) times daily as needed (for pain). 04/27/21   Varney Biles, MD  doxycycline (VIBRAMYCIN) 100 MG capsule Take 1 capsule (100 mg total) by mouth 2 (two) times daily. One po bid x 7 days 03/16/21   Deno Etienne, DO  EPINEPHrine 0.3 mg/0.3 mL IJ SOAJ injection Inject 0.3 mg into the muscle once as needed for anaphylaxis.    [provider]  FEROSUL 325 (65 Fe)  MG tablet Take 325 mg by mouth daily. 03/22/17   [provider]  glucose blood test strip Use as instructed to check blood sugar up to 4 times daily Patient taking differently: 1 each by Other route See admin instructions. Use as instructed to check blood sugar up to 4 times daily 08/06/17   [name], DO  ipratropium-albuterol (DUONEB) 0.5-2.5 (3) MG/3ML SOLN Take 3 mLs by nebulization 4 (four) times daily.  04/05/17   [provider]  levothyroxine (SYNTHROID, LEVOTHROID) 112 MCG tablet Take 224 mcg by mouth daily before breakfast. 07/11/17   [provider]  lisinopril (PRINIVIL,ZESTRIL) 5 MG tablet Take 1  tablet (5 mg total) by mouth daily. Please call for office visit [phone] Patient taking differently: Take 5 mg by mouth daily. 08/15/17   [name], MD  metFORMIN (GLUCOPHAGE) 500 MG tablet Take 1 tablet (500 mg total) by mouth 2 (two) times daily with a meal. 12/11/14   [name], MD  nitroGLYCERIN (NITROSTAT) 0.4 MG SL tablet Place 1 tablet (0.4 mg total) under the tongue every 5 (five) minutes as needed for chest pain. 06/19/18   [name], MD  ondansetron (ZOFRAN-ODT) 4 MG disintegrating tablet 48m ODT q4 hours prn nausea/vomit 03/09/21   YDrenda Freeze MD  oxyCODONE-acetaminophen (PERCOCET) 10-325 MG tablet Take 1 tablet by mouth every 6 (six) hours as needed for pain. 12/03/19   [provider]  OXYGEN Inhale 4 L into the lungs continuous.    [provider]  potassium chloride SA (K-DUR,KLOR-CON) 20 MEQ tablet Take 1 tablet (20 mEq total) by mouth 2 (two) times daily. 07/24/18   MLarey Dresser MD  predniSONE (DELTASONE) 20 MG tablet Take 2 tablets (40 mg total) by mouth daily. Patient not taking: No sig reported 11/11/20   Horton, CBarbette Hair MD  rosuvastatin (CRESTOR) 10 MG tablet Take 10 mg by mouth every evening. 11/19/19   [provider]  senna-docusate (SENOKOT-S) 8.6-50 MG tablet Take 1 tablet by mouth daily.    [provider]  torsemide (DEMADEX) 20 MG tablet Take 2 tablets (40 mg total) by mouth 2 (two) times daily. 12/17/20   MLarey Dresser MD  traZODone (DESYREL) 50 MG tablet Take 100 mg by mouth at bedtime. 11/19/19   [provider]  triamcinolone cream (KENALOG) 0.1 % Apply 1 application topically 2 (two) times daily. 09/10/20   [provider]  umeclidinium-vilanterol (ANORO ELLIPTA) 62.5-25 MCG/INH AEPB Inhale 1 puff into the lungs daily.    [provider]      Allergies    Bee venom, Fioricet [butalbital-apap-caffeine], Ibuprofen, Lamisil [terbinafine], and Nsaids    Review of  Systems   Review of Systems  Physical Exam Updated Vital Signs BP 123/78 (BP Location: Right Arm)    Pulse 85    Temp 98.8 F (37.1 C) (Oral)    Resp 18    Ht '5\' 5"'  (1.651 m)    SpO2 95%    BMI 49.89 kg/m  Physical Exam Vitals and nursing note reviewed.  Constitutional:      Appearance: She is well-developed.  HENT:     Head: Normocephalic and atraumatic.  Eyes:     Extraocular Movements: Extraocular movements intact.     Pupils: Pupils are equal, round, and reactive to light.  Neck:     Comments: No midline c-spine tenderness Cardiovascular:     Rate and Rhythm: Normal rate and regular rhythm.     Heart  sounds: No murmur heard. Pulmonary:     Effort: Pulmonary effort is normal. No respiratory distress.     Breath sounds: Normal breath sounds.  Chest:     Chest wall: No tenderness.  Abdominal:     General: Bowel sounds are normal. There is no distension.     Palpations: Abdomen is soft.     Tenderness: There is no abdominal tenderness.  Musculoskeletal:        General: Swelling and tenderness present. No deformity.     Cervical back: Neck supple. No tenderness.     Comments: Left knee tenderness with palpation diffusely otherwise: No long bone tenderness - upper and lower extrmeities and no pelvic pain, instability.  Skin:    General: Skin is warm and dry.     Findings: No rash.  Neurological:     Mental Status: She is alert and oriented to person, place, and time.     Cranial Nerves: No cranial nerve deficit.    ED Results / Procedures / Treatments   Labs (all labs ordered are listed, but only abnormal results are displayed) Labs Reviewed  BASIC METABOLIC PANEL - Abnormal; Notable for the following components:      Result Value   CO2 36 (*)    All other components within normal limits  CBC WITH DIFFERENTIAL/PLATELET - Abnormal; Notable for the following components:   WBC 12.9 (*)    RBC 3.62 (*)    Hemoglobin 10.2 (*)    HCT 33.7 (*)    Neutro Abs 8.9 (*)     All other components within normal limits  CK  URINALYSIS, ROUTINE W REFLEX MICROSCOPIC    EKG EKG Interpretation  Date/Time:  Wednesday April 27 2021 17:10:29 EST Ventricular Rate:  88 PR Interval:  168 QRS Duration: 79 QT Interval:  367 QTC Calculation: 444 R Axis:   45 Text Interpretation: Sinus rhythm Low voltage, precordial leads No acute changes No significant change since last tracing Confirmed by Varney Biles ([id number]) on 04/27/2021 7:44:47 PM  Radiology DG Knee Complete 4 Views Left  Result Date: 04/27/2021 CLINICAL DATA:  Knee pain. EXAM: LEFT KNEE - COMPLETE 4+ VIEW COMPARISON:  None. FINDINGS: There is a small joint effusion. There is no acute fracture or dislocation identified. There is tricompartmental osteophyte formation most significant in the lateral compartment. Joint spaces are well maintained. Alignment is anatomic. IMPRESSION: 1. No acute bony abnormality. 2. Small joint effusion. 3. Mild tricompartmental osteoarthrosis. Electronically Signed   By: Ronney Asters M.D.   On: 04/27/2021 19:56    Procedures Procedures    Medications Ordered in ED Medications  ketorolac (TORADOL) 30 MG/ML injection 30 mg (30 mg Intramuscular Given 04/27/21 2042)    ED Course/ Medical Decision Making/ A&P Clinical Course as of 04/27/21 2208  Wed Apr 27, 2021  2207 I reviewed patient's labs.  CBC, metabolic profile, urine analysis are reassuring.  She has chronic elevated CO2.  CK was also ordered and it is normal. [AN]  2207 DG Knee Complete 4 Views Left No acute findings. [AN]  2207 The patient appears reasonably screened and/or stabilized for discharge and I doubt any other medical condition or other [facility] requiring further screening, evaluation, or treatment in the ED at this time prior to discharge.  Results from the ER workup discussed with the patient face to face and all questions answered to the best of my ability. The patient is safe for discharge with strict return  precautions.  She has requested topical  gel for her knee, Voltaren has been prescribed. [AN]    Clinical Course User Index [AN] Varney Biles, MD                           Medical Decision Making 61 year old female comes in with chief complaint of fall.  She is having pain over her left knee.  She was having difficulty ambulating after the fall.  Patient is slightly somnolent during our assessment, but answers all the questions appropriately.  Labs ordered to ensure there is no systemic issues that led to the fall in first place.  X-ray of the knee also ordered.   Previous ED visit and notes from outreach team reviewed.  Problems Addressed: Sprain of left knee, unspecified ligament, initial encounter: self-limited or minor problem  Amount and/or Complexity of Data Reviewed Labs: ordered. Decision-making details documented in ED Course. Radiology: ordered and independent interpretation performed.    Details: X-rays did not reveal any acute evidence of fracture  Risk OTC drugs. Prescription drug management.           Final Clinical Impression(s) / ED Diagnoses Final diagnoses:  Sprain of left knee, unspecified ligament, initial encounter    Rx / DC Orders ED Discharge Orders          Ordered    diclofenac sodium (VOLTAREN) 1 % GEL  4 times daily PRN        04/27/21 2202              Varney Biles, MD 04/27/21 2208

## 2021-04-27 NOTE — ED Triage Notes (Signed)
Arrived by EMS. Onset one week of bilateral knee pain.  States both swollen.  States fell yesterday.  Denies injury but C/O increase knee pain

## 2021-04-27 NOTE — ED Notes (Signed)
On o2 via [place] at 2L as patient wears oxygen at home

## 2021-04-28 DIAGNOSIS — Z7689 Persons encountering health services in other specified circumstances: Secondary | ICD-10-CM | POA: Diagnosis not present

## 2021-04-29 ENCOUNTER — Other Ambulatory Visit: Payer: Self-pay | Admitting: Obstetrics and Gynecology

## 2021-04-29 NOTE — Patient Instructions (Signed)
Visit Information  Ms. [name]  - as a part of your Medicaid benefit, you are eligible for care management and care coordination services at no cost or copay. I was unable to reach you by phone today but would be happy to help you with your health related needs. Please feel free to call me at [phone].  [name] RN, BSN [place]   [facility] [employer] - Managed Medicaid High Risk [phone].

## 2021-04-29 NOTE — Patient Outreach (Cosign Needed)
Care Coordination  04/29/2021  [name] 08/01/1960 [id number]   Medicaid Managed Care   Unsuccessful Outreach Note  04/29/2021 Name: Michele Owens MRN: [id number] DOB: 1960-09-20  Referred by: [name], MD Reason for referral : High Risk Managed Medicaid (Unsuccessful telephone outreach)   Third unsuccessful telephone outreach was attempted today. The patient was referred to the case management team for assistance with care management and care coordination. The patient's primary care provider has been notified of our unsuccessful attempts to make or maintain contact with the patient. The care management team is pleased to engage with this patient at any time in the future should he/she be interested in assistance from the care management team.   Follow Up Plan: We have been unable to make contact with the patient for follow up. The care management team is available to follow up with the patient after provider conversation with the patient regarding recommendation for care management engagement and subsequent re-referral to the care management team.   [name] RN, BSN [place] Management Coordinator - Managed [facility] High Risk [phone].

## 2021-05-02 DIAGNOSIS — M25562 Pain in left knee: Secondary | ICD-10-CM | POA: Diagnosis not present

## 2021-05-02 DIAGNOSIS — M25561 Pain in right knee: Secondary | ICD-10-CM | POA: Diagnosis not present

## 2021-05-09 ENCOUNTER — Other Ambulatory Visit (HOSPITAL_COMMUNITY): Payer: Self-pay | Admitting: Cardiology

## 2021-05-22 DIAGNOSIS — J449 Chronic obstructive pulmonary disease, unspecified: Secondary | ICD-10-CM | POA: Diagnosis not present

## 2021-05-30 DIAGNOSIS — I110 Hypertensive heart disease with heart failure: Secondary | ICD-10-CM | POA: Diagnosis not present

## 2021-05-31 DIAGNOSIS — I110 Hypertensive heart disease with heart failure: Secondary | ICD-10-CM | POA: Diagnosis not present

## 2021-06-01 DIAGNOSIS — I110 Hypertensive heart disease with heart failure: Secondary | ICD-10-CM | POA: Diagnosis not present

## 2021-06-02 DIAGNOSIS — I110 Hypertensive heart disease with heart failure: Secondary | ICD-10-CM | POA: Diagnosis not present

## 2021-06-03 DIAGNOSIS — I110 Hypertensive heart disease with heart failure: Secondary | ICD-10-CM | POA: Diagnosis not present

## 2021-06-06 DIAGNOSIS — I110 Hypertensive heart disease with heart failure: Secondary | ICD-10-CM | POA: Diagnosis not present

## 2021-06-07 DIAGNOSIS — I110 Hypertensive heart disease with heart failure: Secondary | ICD-10-CM | POA: Diagnosis not present

## 2021-06-08 DIAGNOSIS — I110 Hypertensive heart disease with heart failure: Secondary | ICD-10-CM | POA: Diagnosis not present

## 2021-06-09 ENCOUNTER — Encounter (HOSPITAL_BASED_OUTPATIENT_CLINIC_OR_DEPARTMENT_OTHER): Payer: Self-pay | Admitting: Emergency Medicine

## 2021-06-09 ENCOUNTER — Emergency Department (HOSPITAL_BASED_OUTPATIENT_CLINIC_OR_DEPARTMENT_OTHER)
Admission: EM | Admit: 2021-06-09 | Discharge: 2021-06-10 | Disposition: A | Discharge status: Home / Self Care | Payer: Medicaid Other | Attending: Emergency Medicine | Admitting: Emergency Medicine

## 2021-06-09 ENCOUNTER — Emergency Department (HOSPITAL_BASED_OUTPATIENT_CLINIC_OR_DEPARTMENT_OTHER): Payer: Medicaid Other

## 2021-06-09 ENCOUNTER — Other Ambulatory Visit: Payer: Self-pay

## 2021-06-09 DIAGNOSIS — R1084 Generalized abdominal pain: Secondary | ICD-10-CM | POA: Diagnosis not present

## 2021-06-09 DIAGNOSIS — I110 Hypertensive heart disease with heart failure: Secondary | ICD-10-CM | POA: Diagnosis not present

## 2021-06-09 DIAGNOSIS — Z7982 Long term (current) use of aspirin: Secondary | ICD-10-CM | POA: Diagnosis not present

## 2021-06-09 DIAGNOSIS — I10 Essential (primary) hypertension: Secondary | ICD-10-CM | POA: Diagnosis not present

## 2021-06-09 DIAGNOSIS — Z743 Need for continuous supervision: Secondary | ICD-10-CM | POA: Diagnosis not present

## 2021-06-09 DIAGNOSIS — Z79899 Other long term (current) drug therapy: Secondary | ICD-10-CM | POA: Diagnosis not present

## 2021-06-09 DIAGNOSIS — E119 Type 2 diabetes mellitus without complications: Secondary | ICD-10-CM | POA: Diagnosis not present

## 2021-06-09 DIAGNOSIS — Z7984 Long term (current) use of oral hypoglycemic drugs: Secondary | ICD-10-CM | POA: Insufficient documentation

## 2021-06-09 DIAGNOSIS — R109 Unspecified abdominal pain: Secondary | ICD-10-CM | POA: Diagnosis not present

## 2021-06-09 DIAGNOSIS — M7989 Other specified soft tissue disorders: Secondary | ICD-10-CM | POA: Insufficient documentation

## 2021-06-09 DIAGNOSIS — R0602 Shortness of breath: Secondary | ICD-10-CM | POA: Diagnosis not present

## 2021-06-09 LAB — URINALYSIS, ROUTINE W REFLEX MICROSCOPIC
Bilirubin Urine: NEGATIVE
Glucose, UA: NEGATIVE mg/dL
Hgb urine dipstick: NEGATIVE
Ketones, ur: NEGATIVE mg/dL
Leukocytes,Ua: NEGATIVE
Nitrite: NEGATIVE
Protein, ur: NEGATIVE mg/dL
Specific Gravity, Urine: 1.021 (ref 1.005–1.030)
pH: 6.5 (ref 5.0–8.0)

## 2021-06-09 LAB — CBC
HCT: 33 % — ABNORMAL LOW (ref 36.0–46.0)
Hemoglobin: 10.2 g/dL — ABNORMAL LOW (ref 12.0–15.0)
MCH: 29.1 pg (ref 26.0–34.0)
MCHC: 30.9 g/dL (ref 30.0–36.0)
MCV: 94 fL (ref 80.0–100.0)
Platelets: 265 10*3/uL (ref 150–400)
RBC: 3.51 MIL/uL — ABNORMAL LOW (ref 3.87–5.11)
RDW: 13.7 % (ref 11.5–15.5)
WBC: 12.6 10*3/uL — ABNORMAL HIGH (ref 4.0–10.5)
nRBC: 0 % (ref 0.0–0.2)

## 2021-06-09 LAB — COMPREHENSIVE METABOLIC PANEL
ALT: 13 U/L (ref 0–44)
AST: 13 U/L — ABNORMAL LOW (ref 15–41)
Albumin: 3.9 g/dL (ref 3.5–5.0)
Alkaline Phosphatase: 46 U/L (ref 38–126)
Anion gap: 9 (ref 5–15)
BUN: 20 mg/dL (ref 8–23)
CO2: 32 mmol/L (ref 22–32)
Calcium: 8.9 mg/dL (ref 8.9–10.3)
Chloride: 98 mmol/L (ref 98–111)
Creatinine, Ser: 1.09 mg/dL — ABNORMAL HIGH (ref 0.44–1.00)
GFR, Estimated: 58 mL/min — ABNORMAL LOW (ref >60)
Glucose, Bld: 109 mg/dL — ABNORMAL HIGH (ref 70–99)
Potassium: 4 mmol/L (ref 3.5–5.1)
Sodium: 139 mmol/L (ref 135–145)
Total Bilirubin: 0.2 mg/dL — ABNORMAL LOW (ref 0.3–1.2)
Total Protein: 7.2 g/dL (ref 6.5–8.1)

## 2021-06-09 LAB — LIPASE, BLOOD: Lipase: 14 U/L (ref 11–51)

## 2021-06-09 MED ORDER — IOHEXOL 300 MG/ML  SOLN
100.0000 mL | Freq: Once | INTRAMUSCULAR | Status: AC | PRN
  Administered 2021-06-09: 100 mL via INTRAVENOUS

## 2021-06-09 NOTE — ED Provider Notes (Signed)
[facility] EMERGENCY DEPT Provider Note   CSN: [id number] Arrival date & time: 06/09/21  1829     History  Chief Complaint  Patient presents with   Abdominal Pain    Michele Owens is a 61 y.o. female.  Pt reports complains of abdominal swelling and discomfort.   Pt reports she hurts in upper abdomen.  Pt also reports her thyroid is sore.  Pt has a history of thyroid disease.  Pt reports she has been waking up startled from sleep.  Pt states this is unusual.    The history is provided by the patient. No language interpreter was used.  Abdominal Pain Pain location:  Generalized Pain quality: aching       Home Medications Prior to Admission medications   Medication Sig Start Date End Date Taking? Authorizing Provider  albuterol (PROVENTIL HFA;VENTOLIN HFA) 108 (90 Base) MCG/ACT inhaler Inhale 1-2 puffs into the lungs every 6 (six) hours as needed for wheezing or shortness of breath. 04/23/18   Long, [name], MD  allopurinol (ZYLOPRIM) 100 MG tablet Take 100 mg by mouth daily.    [provider]  aspirin EC 81 MG tablet Take 81 mg by mouth daily. Swallow whole.    [provider]  azithromycin (ZITHROMAX Z-PAK) 250 MG tablet 2 po day one, then 1 daily x 4 days 03/09/21   Drenda Freeze, MD  benzonatate (TESSALON) 200 MG capsule Take 1 capsule (200 mg total) by mouth in the morning, at noon, and at bedtime. 7 DS Patient taking differently: Take 200 mg by mouth 3 (three) times daily as needed for cough. 04/13/20   Regalado, Belkys A, MD  blood glucose meter kit and supplies KIT Dispense based on patient and insurance preference. Use up to four times daily as directed. 12/25/20   Little Ishikawa, MD  busPIRone (BUSPAR) 5 MG tablet Take 5 mg by mouth 3 (three) times daily as needed (anxiety).    [provider]  Butalbital-APAP-Caffeine 50-300-40 MG CAPS Take 1 capsule by mouth 2 (two) times daily as needed for headache.    [provider]  calcitRIOL (ROCALTROL) 0.5 MCG capsule Take 1 capsule (0.5 mcg total) by mouth daily. 09/26/16   Hongalgi, [name], MD  carvedilol (COREG) 6.25 MG tablet Take 1 tablet (6.25 mg total) by mouth 2 (two) times daily with a meal. 01/23/18   Bensimhon, [name], MD  clonazePAM (KLONOPIN) 2 MG tablet Take 1 tablet (2 mg total) by mouth 3 (three) times daily. for anxiety Patient taking differently: Take 2 mg by mouth 3 (three) times daily. 06/23/19   [name], MD  cyclobenzaprine (FLEXERIL) 10 MG tablet Take 10 mg by mouth 3 (three) times daily. 11/19/19   [provider]  diclofenac sodium (VOLTAREN) 1 % GEL Apply 4 g topically 4 (four) times daily as needed (for pain). 04/27/21   Varney Biles, MD  doxycycline (VIBRAMYCIN) 100 MG capsule Take 1 capsule (100 mg total) by mouth 2 (two) times daily. One po bid x 7 days 03/16/21   Deno Etienne, DO  EPINEPHrine 0.3 mg/0.3 mL IJ SOAJ injection Inject 0.3 mg into the muscle once as needed for anaphylaxis.    [provider]  FEROSUL 325 (65 Fe) MG tablet Take 325 mg by mouth daily. 03/22/17   [provider]  glucose blood test strip Use as instructed to check blood sugar up to 4 times daily Patient taking differently: 1 each by Other route See admin  instructions. Use as instructed to check blood sugar up to 4 times daily 08/06/17   [name], DO  ipratropium-albuterol (DUONEB) 0.5-2.5 (3) MG/3ML SOLN Take 3 mLs by nebulization 4 (four) times daily.  04/05/17   [provider]  levothyroxine (SYNTHROID, LEVOTHROID) 112 MCG tablet Take 224 mcg by mouth daily before breakfast. 07/11/17   [provider]  lisinopril (PRINIVIL,ZESTRIL) 5 MG tablet Take 1 tablet (5 mg total) by mouth daily. Please call for office visit [phone] Patient taking differently: Take 5 mg by mouth daily. 08/15/17   [name], MD  metFORMIN (GLUCOPHAGE) 500 MG tablet Take 1 tablet (500 mg total) by mouth 2 (two) times  daily with a meal. 12/11/14   [name], MD  nitroGLYCERIN (NITROSTAT) 0.4 MG SL tablet Place 1 tablet (0.4 mg total) under the tongue every 5 (five) minutes as needed for chest pain. 06/19/18   [name], MD  ondansetron (ZOFRAN-ODT) 4 MG disintegrating tablet 68m ODT q4 hours prn nausea/vomit 03/09/21   YDrenda Freeze MD  oxyCODONE-acetaminophen (PERCOCET) 10-325 MG tablet Take 1 tablet by mouth every 6 (six) hours as needed for pain. 12/03/19   [provider]  OXYGEN Inhale 4 L into the lungs continuous.    [provider]  potassium chloride SA (K-DUR,KLOR-CON) 20 MEQ tablet Take 1 tablet (20 mEq total) by mouth 2 (two) times daily. 07/24/18   MLarey Dresser MD  predniSONE (DELTASONE) 20 MG tablet Take 2 tablets (40 mg total) by mouth daily. Patient not taking: No sig reported 11/11/20   Horton, CBarbette Hair MD  rosuvastatin (CRESTOR) 10 MG tablet Take 10 mg by mouth every evening. 11/19/19   [provider]  senna-docusate (SENOKOT-S) 8.6-50 MG tablet Take 1 tablet by mouth daily.    [provider]  torsemide (DEMADEX) 20 MG tablet Take 2 tablets (40 mg total) by mouth 2 (two) times daily. Absolute last refill without cardiology follow up please call [phone] schedule thanks 05/09/21   MLarey Dresser MD  traZODone (DESYREL) 50 MG tablet Take 100 mg by mouth at bedtime. 11/19/19   [provider]  triamcinolone cream (KENALOG) 0.1 % Apply 1 application topically 2 (two) times daily. 09/10/20   [provider]  umeclidinium-vilanterol (ANORO ELLIPTA) 62.5-25 MCG/INH AEPB Inhale 1 puff into the lungs daily.    [provider]      Allergies    Bee venom, Fioricet [butalbital-apap-caffeine], Ibuprofen, Lamisil [terbinafine], and Nsaids    Review of Systems   Review of Systems  Gastrointestinal:  Positive for abdominal pain.  All other systems reviewed and are negative.  Physical Exam Updated Vital  Signs BP 110/61    Pulse 83    Temp (!) 97.1 F (36.2 C)    Resp 18    Ht '5\' 5"'  (1.651 m)    SpO2 100%    BMI 49.89 kg/m  Physical Exam Vitals and nursing note reviewed.  Constitutional:      Appearance: She is well-developed.  HENT:     Head: Normocephalic.  Cardiovascular:     Rate and Rhythm: Normal rate.  Pulmonary:     Effort: Pulmonary effort is normal.  Abdominal:     General: Abdomen is protuberant. Bowel sounds are normal. There is distension.     Palpations: Abdomen is soft.     Tenderness: There is generalized abdominal tenderness and tenderness in the right upper quadrant, epigastric area and left upper quadrant.  Musculoskeletal:  General: Normal range of motion.     Cervical back: Normal range of motion.  Skin:    General: Skin is warm.  Neurological:     General: No focal deficit present.     Mental Status: She is alert and oriented to person, place, and time.    ED Results / Procedures / Treatments   Labs (all labs ordered are listed, but only abnormal results are displayed) Labs Reviewed  COMPREHENSIVE METABOLIC PANEL - Abnormal; Notable for the following components:      Result Value   Glucose, Bld 109 (*)    Creatinine, Ser 1.09 (*)    AST 13 (*)    Total Bilirubin 0.2 (*)    GFR, Estimated 58 (*)    All other components within normal limits  CBC - Abnormal; Notable for the following components:   WBC 12.6 (*)    RBC 3.51 (*)    Hemoglobin 10.2 (*)    HCT 33.0 (*)    All other components within normal limits  LIPASE, BLOOD  URINALYSIS, ROUTINE W REFLEX MICROSCOPIC    EKG None  Radiology No results found.  Procedures Procedures    Medications Ordered in ED Medications - No data to display  ED Course/ Medical Decision Making/ A&P                           Medical Decision Making Amount and/or Complexity of Data Reviewed Labs: ordered. Radiology: ordered.   MDM:  Ct scan ordered,  Labs show elevated wbc count of 12.6.  Ct  abdomen ordered.  Pt's care turned over to Dr. Maryan Rued at 10:30 pm.         Final Clinical Impression(s) / ED Diagnoses Final diagnoses:  None    Rx / DC Orders ED Discharge Orders     None         Sidney Ace 06/09/21 2238    Blanchie Dessert, MD 06/11/21 Benancio Deeds

## 2021-06-09 NOTE — ED Triage Notes (Signed)
Arrives via EMS c/o abd pain for several days. Endorses spitting, cold chills and her thyroid has been hurting.  ?

## 2021-06-10 DIAGNOSIS — I110 Hypertensive heart disease with heart failure: Secondary | ICD-10-CM | POA: Diagnosis not present

## 2021-06-10 MED ORDER — DICYCLOMINE HCL 10 MG PO CAPS
10.0000 mg | ORAL_CAPSULE | Freq: Once | ORAL | Status: AC
  Administered 2021-06-10: 10 mg via ORAL
  Filled 2021-06-10: qty 1

## 2021-06-10 MED ORDER — DICYCLOMINE HCL 20 MG PO TABS: 20.0000 mg | ORAL_TABLET | Freq: Two times a day (BID) | ORAL | 0 refills | Status: DC | PRN

## 2021-06-10 NOTE — ED Notes (Signed)
Pt verbalizes understanding of discharge instructions. Opportunity for questioning and answers were provided. Pt discharged from ED to home.   ? ?

## 2021-06-13 DIAGNOSIS — I110 Hypertensive heart disease with heart failure: Secondary | ICD-10-CM | POA: Diagnosis not present

## 2021-06-14 DIAGNOSIS — I110 Hypertensive heart disease with heart failure: Secondary | ICD-10-CM | POA: Diagnosis not present

## 2021-06-15 DIAGNOSIS — I110 Hypertensive heart disease with heart failure: Secondary | ICD-10-CM | POA: Diagnosis not present

## 2021-06-16 DIAGNOSIS — I110 Hypertensive heart disease with heart failure: Secondary | ICD-10-CM | POA: Diagnosis not present

## 2021-06-17 DIAGNOSIS — I110 Hypertensive heart disease with heart failure: Secondary | ICD-10-CM | POA: Diagnosis not present

## 2021-06-19 DIAGNOSIS — J449 Chronic obstructive pulmonary disease, unspecified: Secondary | ICD-10-CM | POA: Diagnosis not present

## 2021-06-20 DIAGNOSIS — I110 Hypertensive heart disease with heart failure: Secondary | ICD-10-CM | POA: Diagnosis not present

## 2021-06-21 DIAGNOSIS — I110 Hypertensive heart disease with heart failure: Secondary | ICD-10-CM | POA: Diagnosis not present

## 2021-06-22 DIAGNOSIS — I110 Hypertensive heart disease with heart failure: Secondary | ICD-10-CM | POA: Diagnosis not present

## 2021-06-23 DIAGNOSIS — I110 Hypertensive heart disease with heart failure: Secondary | ICD-10-CM | POA: Diagnosis not present

## 2021-06-24 DIAGNOSIS — I110 Hypertensive heart disease with heart failure: Secondary | ICD-10-CM | POA: Diagnosis not present

## 2021-06-26 ENCOUNTER — Encounter (HOSPITAL_COMMUNITY): Payer: Self-pay | Admitting: Emergency Medicine

## 2021-06-26 ENCOUNTER — Emergency Department (HOSPITAL_COMMUNITY)
Admission: EM | Admit: 2021-06-26 | Discharge: 2021-06-27 | Disposition: A | Discharge status: Home / Self Care | Payer: Medicaid Other | Attending: Student | Admitting: Student

## 2021-06-26 ENCOUNTER — Emergency Department (HOSPITAL_COMMUNITY): Payer: Medicaid Other

## 2021-06-26 DIAGNOSIS — R079 Chest pain, unspecified: Secondary | ICD-10-CM | POA: Diagnosis not present

## 2021-06-26 DIAGNOSIS — W010XXA Fall on same level from slipping, tripping and stumbling without subsequent striking against object, initial encounter: Secondary | ICD-10-CM

## 2021-06-26 DIAGNOSIS — Z79899 Other long term (current) drug therapy: Secondary | ICD-10-CM | POA: Diagnosis not present

## 2021-06-26 DIAGNOSIS — I110 Hypertensive heart disease with heart failure: Secondary | ICD-10-CM | POA: Diagnosis not present

## 2021-06-26 DIAGNOSIS — Z955 Presence of coronary angioplasty implant and graft: Secondary | ICD-10-CM | POA: Diagnosis not present

## 2021-06-26 DIAGNOSIS — Z7984 Long term (current) use of oral hypoglycemic drugs: Secondary | ICD-10-CM | POA: Insufficient documentation

## 2021-06-26 DIAGNOSIS — Z7982 Long term (current) use of aspirin: Secondary | ICD-10-CM | POA: Diagnosis not present

## 2021-06-26 DIAGNOSIS — M25511 Pain in right shoulder: Secondary | ICD-10-CM | POA: Insufficient documentation

## 2021-06-26 DIAGNOSIS — Z743 Need for continuous supervision: Secondary | ICD-10-CM | POA: Diagnosis not present

## 2021-06-26 DIAGNOSIS — J9811 Atelectasis: Secondary | ICD-10-CM | POA: Diagnosis not present

## 2021-06-26 DIAGNOSIS — R0602 Shortness of breath: Secondary | ICD-10-CM | POA: Diagnosis not present

## 2021-06-26 DIAGNOSIS — Z7951 Long term (current) use of inhaled steroids: Secondary | ICD-10-CM | POA: Diagnosis not present

## 2021-06-26 DIAGNOSIS — Z85850 Personal history of malignant neoplasm of thyroid: Secondary | ICD-10-CM | POA: Insufficient documentation

## 2021-06-26 DIAGNOSIS — W19XXXA Unspecified fall, initial encounter: Secondary | ICD-10-CM | POA: Insufficient documentation

## 2021-06-26 DIAGNOSIS — R0789 Other chest pain: Secondary | ICD-10-CM | POA: Diagnosis not present

## 2021-06-26 DIAGNOSIS — J45909 Unspecified asthma, uncomplicated: Secondary | ICD-10-CM | POA: Diagnosis not present

## 2021-06-26 DIAGNOSIS — M546 Pain in thoracic spine: Secondary | ICD-10-CM | POA: Diagnosis not present

## 2021-06-26 DIAGNOSIS — Z87891 Personal history of nicotine dependence: Secondary | ICD-10-CM | POA: Insufficient documentation

## 2021-06-26 DIAGNOSIS — D72829 Elevated white blood cell count, unspecified: Secondary | ICD-10-CM | POA: Insufficient documentation

## 2021-06-26 DIAGNOSIS — I5032 Chronic diastolic (congestive) heart failure: Secondary | ICD-10-CM | POA: Diagnosis not present

## 2021-06-26 DIAGNOSIS — J449 Chronic obstructive pulmonary disease, unspecified: Secondary | ICD-10-CM | POA: Diagnosis not present

## 2021-06-26 DIAGNOSIS — R400 Somnolence: Secondary | ICD-10-CM | POA: Diagnosis not present

## 2021-06-26 DIAGNOSIS — N289 Disorder of kidney and ureter, unspecified: Secondary | ICD-10-CM

## 2021-06-26 DIAGNOSIS — M549 Dorsalgia, unspecified: Secondary | ICD-10-CM | POA: Diagnosis not present

## 2021-06-26 DIAGNOSIS — D649 Anemia, unspecified: Secondary | ICD-10-CM

## 2021-06-26 DIAGNOSIS — M545 Low back pain, unspecified: Secondary | ICD-10-CM | POA: Diagnosis not present

## 2021-06-26 DIAGNOSIS — M40204 Unspecified kyphosis, thoracic region: Secondary | ICD-10-CM | POA: Diagnosis not present

## 2021-06-26 DIAGNOSIS — R410 Disorientation, unspecified: Secondary | ICD-10-CM | POA: Diagnosis not present

## 2021-06-26 LAB — CBC WITH DIFFERENTIAL/PLATELET
Abs Immature Granulocytes: 0.14 10*3/uL — ABNORMAL HIGH (ref 0.00–0.07)
Basophils Absolute: 0.1 10*3/uL (ref 0.0–0.1)
Basophils Relative: 0 %
Eosinophils Absolute: 0.2 10*3/uL (ref 0.0–0.5)
Eosinophils Relative: 1 %
HCT: 36.2 % (ref 36.0–46.0)
Hemoglobin: 11.6 g/dL — ABNORMAL LOW (ref 12.0–15.0)
Immature Granulocytes: 1 %
Lymphocytes Relative: 21 %
Lymphs Abs: 3.4 10*3/uL (ref 0.7–4.0)
MCH: 29.7 pg (ref 26.0–34.0)
MCHC: 32 g/dL (ref 30.0–36.0)
MCV: 92.6 fL (ref 80.0–100.0)
Monocytes Absolute: 1.1 10*3/uL — ABNORMAL HIGH (ref 0.1–1.0)
Monocytes Relative: 7 %
Neutro Abs: 11.3 10*3/uL — ABNORMAL HIGH (ref 1.7–7.7)
Neutrophils Relative %: 70 %
Platelets: 346 10*3/uL (ref 150–400)
RBC: 3.91 MIL/uL (ref 3.87–5.11)
RDW: 13.6 % (ref 11.5–15.5)
WBC: 16.2 10*3/uL — ABNORMAL HIGH (ref 4.0–10.5)
nRBC: 0 % (ref 0.0–0.2)

## 2021-06-26 LAB — COMPREHENSIVE METABOLIC PANEL
ALT: 16 U/L (ref 0–44)
AST: 19 U/L (ref 15–41)
Albumin: 4.1 g/dL (ref 3.5–5.0)
Alkaline Phosphatase: 49 U/L (ref 38–126)
Anion gap: 13 (ref 5–15)
BUN: 16 mg/dL (ref 8–23)
CO2: 27 mmol/L (ref 22–32)
Calcium: 9.2 mg/dL (ref 8.9–10.3)
Chloride: 96 mmol/L — ABNORMAL LOW (ref 98–111)
Creatinine, Ser: 1.32 mg/dL — ABNORMAL HIGH (ref 0.44–1.00)
GFR, Estimated: 46 mL/min — ABNORMAL LOW (ref >60)
Glucose, Bld: 103 mg/dL — ABNORMAL HIGH (ref 70–99)
Potassium: 3.8 mmol/L (ref 3.5–5.1)
Sodium: 136 mmol/L (ref 135–145)
Total Bilirubin: 0.6 mg/dL (ref 0.3–1.2)
Total Protein: 7.4 g/dL (ref 6.5–8.1)

## 2021-06-26 LAB — TROPONIN I (HIGH SENSITIVITY)
Troponin I (High Sensitivity): 6 ng/L (ref <18)
Troponin I (High Sensitivity): 12 ng/L (ref <18)

## 2021-06-26 LAB — LIPASE, BLOOD: Lipase: 32 U/L (ref 11–51)

## 2021-06-26 MED ORDER — ACETAMINOPHEN 500 MG PO TABS
1000.0000 mg | ORAL_TABLET | Freq: Once | ORAL | Status: AC
  Administered 2021-06-26: 1000 mg via ORAL
  Filled 2021-06-26: qty 2

## 2021-06-26 MED ORDER — LIDOCAINE 5 % EX PTCH
1.0000 | MEDICATED_PATCH | CUTANEOUS | Status: DC
  Administered 2021-06-26: 1 via TRANSDERMAL
  Filled 2021-06-26: qty 1

## 2021-06-26 NOTE — ED Notes (Signed)
Patient requesting pain medication. Triage RN notified ?

## 2021-06-26 NOTE — ED Notes (Addendum)
Pt stated she would like to talk to the doctor about "something personal". Roxanne Mins MD made aware  ? ?

## 2021-06-26 NOTE — ED Provider Triage Note (Signed)
Emergency Medicine Provider Triage Evaluation Note ? ?Michele Owens , a 61 y.o. female  was evaluated in triage.  Pt complains of mid to right-sided chest pain.  She states that this has been ongoing for a week.  The pain goes into her back.  She reports compliance with her medications ? ? ? ?Physical Exam  ?BP (!) 103/57 (BP Location: Left Arm)   Pulse (!) 117   Temp 98.8 ?F (37.1 ?C) (Oral)   Resp 17   SpO2 95%  ?Gen:   Awake, no distress   ?Resp:  Normal effort lung sounds present bilaterally ?MSK:   Moves extremities without difficulty  ?Other:  Tachycardic ? ?Medical Decision Making  ?Medically screening exam initiated at 6:13 PM.  Appropriate orders placed.  Michele Owens was informed that the remainder of the evaluation will be completed by another provider, this initial triage assessment does not replace that evaluation, and the importance of remaining in the ED until their evaluation is complete. ? ?Patient presents today for evaluation of right-sided chest pain.  While this does appear to have musculoskeletal qualities and that it worsens with palpation she has a extensive cardiac history with multiple risk factors and is tachycardic.  Therefore will obtain EKG, labs and chest x-ray.   ?  ?Lorin Glass, PA-C ?06/26/21 1814 ? ?

## 2021-06-26 NOTE — ED Provider Notes (Signed)
?[facility] ?Provider Note ? ?CSN: [id number] ?Arrival date & time: 06/26/21 1723 ? ?Chief Complaint(s) ?No chief complaint on file. ? ?HPI ?Michele Owens is a 61 y.o. female with PMH CHF, COPD on 3 L home O2, DVT, HTN Who presents the emergency department for multiple complaints including who presents emergency department for evaluation of right shoulder pain and chest pain.  She states that she has not slept in 3 days because her neighbors are loud.  She states that she fell on the floor here in the emergency department and is demanding documentation that this be entered into the medical chart.  She endorses right-sided chest pain without radiation, mild shortness of breath, no abdominal pain, nausea, vomiting or other systemic symptoms.  Patient very somnolent on initial exam and history difficult to obtain as patient keeps falling asleep during my exam. ? ?HPI ? ?Past Medical History ?Past Medical History:  ?Diagnosis Date  ? Anxiety   ? Arthritis   ? Asthma   ? Chronic diastolic CHF (congestive heart failure) ([place])   ? COPD (chronic obstructive pulmonary disease) ([place])   ? Uses Oxygen at night  ? Depression   ? Diabetes mellitus without complication ([place])   ? Dyspnea   ? occ  ? Family history of thyroid cancer   ? GERD (gastroesophageal reflux disease)   ? Gout   ? Headache   ? migraines  ? History of DVT of lower extremity   ? History of nuclear stress test   ? Myoview 2/17:  Low risk stress nuclear study with a small, moderate intensity, partially reversible inferior lateral defect consistent with small prior infarct and minimal peri-infarct ischemia; EF 68 with normal wall motion  ? Hypertension   ? Pneumonia   ? Thyroid cancer ([place]) 09/23/2016  ? ?Patient Active Problem List  ? Diagnosis Date Noted  ? Respiratory failure with hypercapnia ([place]) 12/23/2020  ? Anemia 12/23/2020  ? Acute on chronic respiratory failure with hypoxia and hypercapnia ([place]) 04/18/2020  ? Acute on  chronic respiratory failure with hypoxia ([place]) 04/18/2020  ? Hemoptysis 04/18/2020  ? Acute metabolic encephalopathy 93/[id number]/4287  ? Acute encephalopathy 04/18/2020  ? Chronic diastolic CHF (congestive heart failure) ([place]) 04/18/2020  ? Sepsis ([place]) 04/18/2020  ? Morbid obesity with BMI of 50.0-59.9, adult ([place])   ? Fall at home, initial encounter 06/23/2018  ? HNP (herniated nucleus pulposus), thoracic 06/23/2018  ? CHF (congestive heart failure) ([place]) 06/18/2018  ? Hypertensive disorder 08/28/2017  ? Obesity 08/28/2017  ? COPD exacerbation ([place]) 08/04/2017  ? Acute and chronic respiratory failure with hypercapnia (H[facility])   ? Pulmonary HTN ([place])   ? Genetic testing 02/27/2017  ? Family history of thyroid cancer   ? Hypomagnesemia 10/03/2016  ? Atypical chest pain   ? HLD (hyperlipidemia) 09/23/2016  ? Gout 09/23/2016  ? DVT (deep vein thrombosis) in pregnancy 09/23/2016  ? Thyroid cancer ([place]) 09/23/2016  ? Hypocalcemia 09/23/2016  ? AKI (acute kidney injury) ([place]) 09/23/2016  ? Acute pulmonary edema (HCC)   ? Acute hypoxemic respiratory failure ([place])   ? Ventilator dependent ([place])   ? Chronic respiratory failure with hypoxia and hypercapnia ([place]) 07/16/2016  ? CAP (community acquired pneumonia) 01/23/2016  ? 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 01/23/2016  ? Pulmonary hypertension ([place]) 01/23/2016  ? Obesity hypoventilation syndrome ([place]) 08/26/2015  ? Dyslipidemia associated with type 2 diabetes mellitus ([place]) 04/24/2015  ? Leukocytosis 04/24/2015  ? Controlled type 2 diabetes mellitus without complication,  without long-term current use of insulin ([place]) 04/24/2015  ? Anxiety 04/24/2015  ? Benign essential HTN 04/24/2015  ? Normocytic anemia 04/24/2015  ? OSA (obstructive sleep apnea) 05/10/2012  ? Tobacco abuse 07/04/2011  ? ?Home Medication(s) ?Prior to Admission medications   ?Medication Sig Start Date End Date Taking? Authorizing Provider  ?albuterol (PROVENTIL HFA;VENTOLIN HFA) 108 (90 Base)  MCG/ACT inhaler Inhale 1-2 puffs into the lungs every 6 (six) hours as needed for wheezing or shortness of breath. 04/23/18   Long, [name], MD  ?allopurinol (ZYLOPRIM) 100 MG tablet Take 100 mg by mouth daily.    [provider]  ?aspirin EC 81 MG tablet Take 81 mg by mouth daily. Swallow whole.    [provider]  ?azithromycin (ZITHROMAX Z-PAK) 250 MG tablet 2 po day one, then 1 daily x 4 days 03/09/21   Drenda Freeze, MD  ?benzonatate (TESSALON) 200 MG capsule Take 1 capsule (200 mg total) by mouth in the morning, at noon, and at bedtime. 7 DS ?Patient taking differently: Take 200 mg by mouth 3 (three) times daily as needed for cough. 04/13/20   Regalado, Belkys A, MD  ?blood glucose meter kit and supplies KIT Dispense based on patient and insurance preference. Use up to four times daily as directed. 12/25/20   Little Ishikawa, MD  ?busPIRone (BUSPAR) 5 MG tablet Take 5 mg by mouth 3 (three) times daily as needed (anxiety).    [provider]  ?Butalbital-APAP-Caffeine 50-300-40 MG CAPS Take 1 capsule by mouth 2 (two) times daily as needed for headache.    [provider]  ?calcitRIOL (ROCALTROL) 0.5 MCG capsule Take 1 capsule (0.5 mcg total) by mouth daily. 09/26/16   Hongalgi, [name], MD  ?carvedilol (COREG) 6.25 MG tablet Take 1 tablet (6.25 mg total) by mouth 2 (two) times daily with a meal. 01/23/18   Bensimhon, [name], MD  ?clonazePAM (KLONOPIN) 2 MG tablet Take 1 tablet (2 mg total) by mouth 3 (three) times daily. for anxiety ?Patient taking differently: Take 2 mg by mouth 3 (three) times daily. 06/23/19   [name], MD  ?cyclobenzaprine (FLEXERIL) 10 MG tablet Take 10 mg by mouth 3 (three) times daily. 11/19/19   [provider]  ?diclofenac sodium (VOLTAREN) 1 % GEL Apply 4 g topically 4 (four) times daily as needed (for pain). 04/27/21   Varney Biles, MD  ?dicyclomine (BENTYL) 20 MG tablet Take 1 tablet (20 mg total) by mouth 2 (two) times  daily as needed for spasms (abdominal cramping). 06/10/21   Mesner, Corene Cornea, MD  ?doxycycline (VIBRAMYCIN) 100 MG capsule Take 1 capsule (100 mg total) by mouth 2 (two) times daily. One po bid x 7 days 03/16/21   Deno Etienne, DO  ?EPINEPHrine 0.3 mg/0.3 mL IJ SOAJ injection Inject 0.3 mg into the muscle once as needed for anaphylaxis.    [provider]  ?FEROSUL 325 (65 Fe) MG tablet Take 325 mg by mouth daily. 03/22/17   [provider]  ?glucose blood test strip Use as instructed to check blood sugar up to 4 times daily ?Patient taking differently: 1 each by Other route See admin instructions. Use as instructed to check blood sugar up to 4 times daily 08/06/17   [name], DO  ?ipratropium-albuterol (DUONEB) 0.5-2.5 (3) MG/3ML SOLN Take 3 mLs by nebulization 4 (four) times daily.  04/05/17   [provider]  ?levothyroxine (SYNTHROID, LEVOTHROID) 112 MCG tablet Take 224 mcg by mouth daily before breakfast.  07/11/17   [provider]  ?lisinopril (PRINIVIL,ZESTRIL) 5 MG tablet Take 1 tablet (5 mg total) by mouth daily. Please call for office visit [phone] ?Patient taking differently: Take 5 mg by mouth daily. 08/15/17   [name], MD  ?metFORMIN (GLUCOPHAGE) 500 MG tablet Take 1 tablet (500 mg total) by mouth 2 (two) times daily with a meal. 12/11/14   [name], MD  ?nitroGLYCERIN (NITROSTAT) 0.4 MG SL tablet Place 1 tablet (0.4 mg total) under the tongue every 5 (five) minutes as needed for chest pain. 06/19/18   [name], MD  ?ondansetron (ZOFRAN-ODT) 4 MG disintegrating tablet 98m ODT q4 hours prn nausea/vomit 03/09/21   YDrenda Freeze MD  ?oxyCODONE-acetaminophen (PERCOCET) 10-325 MG tablet Take 1 tablet by mouth every 6 (six) hours as needed for pain. 12/03/19   [provider]  ?OXYGEN Inhale 4 L into the lungs continuous.    [provider]  ?potassium chloride SA (K-DUR,KLOR-CON) 20 MEQ tablet Take 1 tablet (20 mEq total) by  mouth 2 (two) times daily. 07/24/18   MLarey Dresser MD  ?predniSONE (DELTASONE) 20 MG tablet Take 2 tablets (40 mg total) by mouth daily. ?Patient not taking: No sig reported 11/11/20   Horton, CBarbette Hair MD

## 2021-06-26 NOTE — ED Provider Notes (Signed)
Care assumed from Dr. Matilde Sprang, patient with shoulder pain and shortness of breath.  Evaluation of shoulder pain has been completed, she is pending CT angiogram of the chest to rule out pulmonary embolism. ? ?CT angiogram shows no evidence of pulmonary embolism or other acute process.  CT of the brain was also negative for acute injury.  I have independently viewed the images, and agree with the radiologist's interpretation.  She is discharged home with instructions to apply ice, use over-the-counter acetaminophen as needed.  ABG did show a mild, compensated, respiratory acidosis.  Because of this, I want to avoid using anything that may be sedating.  Patient is also very fixated on the fall that she had in the waiting room.  She has asked numerous times to have papers highlighting the fall.  I have assured her that the fall was noted in her medical record and she will need to contact medical records department if she wants an official copy. ? ?Results for orders placed or performed during the hospital encounter of 06/26/21  ?Comprehensive metabolic panel  ?Result Value Ref Range  ? Sodium 136 135 - 145 mmol/L  ? Potassium 3.8 3.5 - 5.1 mmol/L  ? Chloride 96 (L) 98 - 111 mmol/L  ? CO2 27 22 - 32 mmol/L  ? Glucose, Bld 103 (H) 70 - 99 mg/dL  ? BUN 16 8 - 23 mg/dL  ? Creatinine, Ser 1.32 (H) 0.44 - 1.00 mg/dL  ? Calcium 9.2 8.9 - 10.3 mg/dL  ? Total Protein 7.4 6.5 - 8.1 g/dL  ? Albumin 4.1 3.5 - 5.0 g/dL  ? AST 19 15 - 41 U/L  ? ALT 16 0 - 44 U/L  ? Alkaline Phosphatase 49 38 - 126 U/L  ? Total Bilirubin 0.6 0.3 - 1.2 mg/dL  ? GFR, Estimated 46 (L) >60 mL/min  ? Anion gap 13 5 - 15  ?CBC with Differential  ?Result Value Ref Range  ? WBC 16.2 (H) 4.0 - 10.5 K/uL  ? RBC 3.91 3.87 - 5.11 MIL/uL  ? Hemoglobin 11.6 (L) 12.0 - 15.0 g/dL  ? HCT 36.2 36.0 - 46.0 %  ? MCV 92.6 80.0 - 100.0 fL  ? MCH 29.7 26.0 - 34.0 pg  ? MCHC 32.0 30.0 - 36.0 g/dL  ? RDW 13.6 11.5 - 15.5 %  ? Platelets 346 150 - 400 K/uL  ? nRBC 0.0 0.0 - 0.2  %  ? Neutrophils Relative % 70 %  ? Neutro Abs 11.3 (H) 1.7 - 7.7 K/uL  ? Lymphocytes Relative 21 %  ? Lymphs Abs 3.4 0.7 - 4.0 K/uL  ? Monocytes Relative 7 %  ? Monocytes Absolute 1.1 (H) 0.1 - 1.0 K/uL  ? Eosinophils Relative 1 %  ? Eosinophils Absolute 0.2 0.0 - 0.5 K/uL  ? Basophils Relative 0 %  ? Basophils Absolute 0.1 0.0 - 0.1 K/uL  ? Immature Granulocytes 1 %  ? Abs Immature Granulocytes 0.14 (H) 0.00 - 0.07 K/uL  ?Lipase, blood  ?Result Value Ref Range  ? Lipase 32 11 - 51 U/L  ?Ethanol  ?Result Value Ref Range  ? Alcohol, Ethyl (B) <10 <10 mg/dL  ?Rapid urine drug screen (hospital performed)  ?Result Value Ref Range  ? Opiates NONE DETECTED NONE DETECTED  ? Cocaine NONE DETECTED NONE DETECTED  ? Benzodiazepines POSITIVE (A) NONE DETECTED  ? Amphetamines POSITIVE (A) NONE DETECTED  ? Tetrahydrocannabinol NONE DETECTED NONE DETECTED  ? Barbiturates POSITIVE (A) NONE DETECTED  ?TSH  ?Result  Value Ref Range  ? TSH 0.308 (L) 0.350 - 4.500 uIU/mL  ?I-Stat venous blood gas, ED  ?Result Value Ref Range  ? pH, Ven 7.335 7.25 - 7.43  ? pCO2, Ven 64.4 (H) 44 - 60 mmHg  ? pO2, Ven 187 (H) 32 - 45 mmHg  ? Bicarbonate 34.3 (H) 20.0 - 28.0 mmol/L  ? TCO2 36 (H) 22 - 32 mmol/L  ? O2 Saturation 100 %  ? Acid-Base Excess 7.0 (H) 0.0 - 2.0 mmol/L  ? Sodium 137 135 - 145 mmol/L  ? Potassium 3.7 3.5 - 5.1 mmol/L  ? Calcium, Ion 1.14 (L) 1.15 - 1.40 mmol/L  ? HCT 36.0 36.0 - 46.0 %  ? Hemoglobin 12.2 12.0 - 15.0 g/dL  ? Sample type VENOUS   ?Troponin I (High Sensitivity)  ?Result Value Ref Range  ? Troponin I (High Sensitivity) 6 <18 ng/L  ?Troponin I (High Sensitivity)  ?Result Value Ref Range  ? Troponin I (High Sensitivity) 12 <18 ng/L  ? ?DG Chest 2 View ? ?Result Date: 06/26/2021 ?CLINICAL DATA:  Midline to right-sided chest pain. EXAM: CHEST - 2 VIEW COMPARISON:  Radiograph 03/16/2021, CT 03/09/2021 FINDINGS: Stable heart size and mediastinal contours. Aortic atherosclerosis. Streaky bibasilar atelectasis or scarring,  chronic and unchanged from prior. No acute airspace disease. No pulmonary edema. No pleural effusion. No pneumothorax. Thoracic spondylosis. IMPRESSION: No acute chest findings. Chronic bibasilar atelectasis or scarring. Electronically Signed   By: Keith Rake M.D.   On: 06/26/2021 19:41  ? ?DG Thoracic Spine 2 View ? ?Result Date: 06/26/2021 ?CLINICAL DATA:  Fall, back pain EXAM: THORACIC SPINE 2 VIEWS COMPARISON:  None. FINDINGS: Normal thoracic kyphosis. No evidence of fracture or dislocation. Vertebral body heights are maintained. Moderate multilevel degenerative changes. IMPRESSION: Negative. Electronically Signed   By: Julian Hy M.D.   On: 06/26/2021 23:03  ? ?DG Lumbar Spine Complete ? ?Result Date: 06/26/2021 ?CLINICAL DATA:  Fall, back pain EXAM: LUMBAR SPINE - COMPLETE 4+ VIEW COMPARISON:  None. FINDINGS: Five lumbar type vertebral bodies. Normal lumbar lordosis. No evidence of fracture or dislocation. Vertebral body heights are maintained. Mild degenerative changes, most prominent at L3-4. Visualized bony pelvis appears intact. IMPRESSION: Negative. Electronically Signed   By: Julian Hy M.D.   On: 06/26/2021 23:02  ? ?DG Shoulder Right ? ?Result Date: 06/26/2021 ?CLINICAL DATA:  Fall, shoulder pain EXAM: RIGHT SHOULDER - 2+ VIEW COMPARISON:  None. FINDINGS: No fracture or dislocation is seen. The joint spaces are preserved. Visualized soft tissues are within normal limits. Visualized right lung is clear. IMPRESSION: Negative. Electronically Signed   By: Julian Hy M.D.   On: 06/26/2021 23:01  ? ?CT Head Wo Contrast ? ?Result Date: 06/27/2021 ?CLINICAL DATA:  Delirium EXAM: CT HEAD WITHOUT CONTRAST TECHNIQUE: Contiguous axial images were obtained from the base of the skull through the vertex without intravenous contrast. RADIATION DOSE REDUCTION: This exam was performed according to the departmental dose-optimization program which includes automated exposure control, adjustment of  the mA and/or kV according to patient size and/or use of iterative reconstruction technique. COMPARISON:  None. FINDINGS: Brain: There is no mass, hemorrhage or extra-axial collection. The size and configuration of the ventricles and extra-axial CSF spaces are normal. The brain parenchyma is normal, without acute or chronic infarction. Vascular: No abnormal hyperdensity of the major intracranial arteries or dural venous sinuses. No intracranial atherosclerosis. Skull: The visualized skull base, calvarium and extracranial soft tissues are normal. Sinuses/Orbits: No fluid levels or advanced mucosal thickening  of the visualized paranasal sinuses. No mastoid or middle ear effusion. The orbits are normal. IMPRESSION: Normal head CT. Electronically Signed   By: Ulyses Jarred M.D.   On: 06/27/2021 01:28  ? ?CT Angio Chest PE W and/or Wo Contrast ? ?Result Date: 06/27/2021 ?CLINICAL DATA:  Chest pain EXAM: CT ANGIOGRAPHY CHEST WITH CONTRAST TECHNIQUE: Multidetector CT imaging of the chest was performed using the standard protocol during bolus administration of intravenous contrast. Multiplanar CT image reconstructions and MIPs were obtained to evaluate the vascular anatomy. RADIATION DOSE REDUCTION: This exam was performed according to the departmental dose-optimization program which includes automated exposure control, adjustment of the mA and/or kV according to patient size and/or use of iterative reconstruction technique. CONTRAST:  110m OMNIPAQUE IOHEXOL 350 MG/ML SOLN COMPARISON:  03/09/2021 FINDINGS: Cardiovascular: Contrast injection is sufficient to demonstrate satisfactory opacification of the pulmonary arteries to the segmental level. There is no pulmonary embolus or evidence of right heart strain. The size of the main pulmonary artery is normal. Heart size is normal, with no pericardial effusion. The course and caliber of the aorta are normal. There is mild atherosclerotic calcification. Opacification decreased  due to pulmonary arterial phase contrast bolus timing. Mediastinum/Nodes: No mediastinal, hilar or axillary lymphadenopathy. Normal visualized thyroid. Thoracic esophageal course is normal. Lungs/Pleura: [place] Dec

## 2021-06-26 NOTE — ED Notes (Signed)
Pt was getting up from recliner to pivot to wheelchcair when she slipped and fell to the ground. This writer was on her right side assisting her out of the recliner when she fell. . Patient did not hit her head. With the help of a visitor, we got the patient up and in recliner. Patient wheeled back to room and RN notified of fall. Patient complaining of tailbone pain.  ?

## 2021-06-26 NOTE — ED Notes (Signed)
Patient transported to X-ray 

## 2021-06-26 NOTE — ED Triage Notes (Signed)
Patient BIB [facility] with right chest pain that is reproducible with palpation that started several weeks ago. Patient is alert, oriented, and in no apparent distress at this time. ?

## 2021-06-27 ENCOUNTER — Emergency Department (HOSPITAL_COMMUNITY): Payer: Medicaid Other

## 2021-06-27 DIAGNOSIS — R079 Chest pain, unspecified: Secondary | ICD-10-CM | POA: Diagnosis not present

## 2021-06-27 DIAGNOSIS — J9811 Atelectasis: Secondary | ICD-10-CM | POA: Diagnosis not present

## 2021-06-27 DIAGNOSIS — R410 Disorientation, unspecified: Secondary | ICD-10-CM | POA: Diagnosis not present

## 2021-06-27 DIAGNOSIS — I110 Hypertensive heart disease with heart failure: Secondary | ICD-10-CM | POA: Diagnosis not present

## 2021-06-27 DIAGNOSIS — M79601 Pain in right arm: Secondary | ICD-10-CM | POA: Diagnosis not present

## 2021-06-27 DIAGNOSIS — Z7689 Persons encountering health services in other specified circumstances: Secondary | ICD-10-CM | POA: Diagnosis not present

## 2021-06-27 LAB — I-STAT VENOUS BLOOD GAS, ED
Acid-Base Excess: 7 mmol/L — ABNORMAL HIGH (ref 0.0–2.0)
Bicarbonate: 34.3 mmol/L — ABNORMAL HIGH (ref 20.0–28.0)
Calcium, Ion: 1.14 mmol/L — ABNORMAL LOW (ref 1.15–1.40)
HCT: 36 % (ref 36.0–46.0)
Hemoglobin: 12.2 g/dL (ref 12.0–15.0)
O2 Saturation: 100 %
Potassium: 3.7 mmol/L (ref 3.5–5.1)
Sodium: 137 mmol/L (ref 135–145)
TCO2: 36 mmol/L — ABNORMAL HIGH (ref 22–32)
pCO2, Ven: 64.4 mmHg — ABNORMAL HIGH (ref 44–60)
pH, Ven: 7.335 (ref 7.25–7.43)
pO2, Ven: 187 mmHg — ABNORMAL HIGH (ref 32–45)

## 2021-06-27 LAB — TSH: TSH: 0.308 u[IU]/mL — ABNORMAL LOW (ref 0.350–4.500)

## 2021-06-27 LAB — RAPID URINE DRUG SCREEN, HOSP PERFORMED
Amphetamines: POSITIVE — AB
Barbiturates: POSITIVE — AB
Benzodiazepines: POSITIVE — AB
Cocaine: NOT DETECTED
Opiates: NOT DETECTED
Tetrahydrocannabinol: NOT DETECTED

## 2021-06-27 LAB — ETHANOL: Alcohol, Ethyl (B): <10 mg/dL (ref <10)

## 2021-06-27 MED ORDER — ACETAMINOPHEN 325 MG PO TABS
650.0000 mg | ORAL_TABLET | Freq: Once | ORAL | Status: AC
  Administered 2021-06-27: 650 mg via ORAL
  Filled 2021-06-27: qty 2

## 2021-06-27 MED ORDER — IOHEXOL 350 MG/ML SOLN
75.0000 mL | Freq: Once | INTRAVENOUS | Status: AC | PRN
  Administered 2021-06-27: 75 mL via INTRAVENOUS

## 2021-06-27 NOTE — Progress Notes (Signed)
Orthopedic Tech Progress Note ?Patient Details:  ?Michele Owens ?1960-12-17 ?[id number] ? ?Ortho Devices ?Type of Ortho Device: Arm sling ?Ortho Device/Splint Location: rue ?Ortho Device/Splint Interventions: Ordered, Application, Adjustment ?  ?Post Interventions ?Patient Tolerated: Well ?Instructions Provided: Care of device, Adjustment of device ? ?[name] ?06/27/2021, 1:36 AM ? ?

## 2021-06-27 NOTE — ED Notes (Signed)
Patient transported to CT 

## 2021-06-27 NOTE — ED Notes (Signed)
RN reviewed discharge instructions with pt. Pt verbalized understanding and had no further questions. VSS upon discharge.  

## 2021-06-27 NOTE — ED Notes (Signed)
Pt c/o 6/10 back pain ?

## 2021-06-27 NOTE — ED Notes (Signed)
[facility] called to transport pt back home 

## 2021-06-27 NOTE — Discharge Instructions (Signed)
Your evaluation did not show any serious problems with your test or any serious injury from your fall. ? ?Please apply ice to any area that is painful.  Ice to be applied for 30 minutes at a time, 4 times a day. ? ?You may take acetaminophen as needed for pain. ?

## 2021-06-28 DIAGNOSIS — I110 Hypertensive heart disease with heart failure: Secondary | ICD-10-CM | POA: Diagnosis not present

## 2021-06-29 DIAGNOSIS — I110 Hypertensive heart disease with heart failure: Secondary | ICD-10-CM | POA: Diagnosis not present

## 2021-06-30 DIAGNOSIS — I110 Hypertensive heart disease with heart failure: Secondary | ICD-10-CM | POA: Diagnosis not present

## 2021-07-01 DIAGNOSIS — I10 Essential (primary) hypertension: Secondary | ICD-10-CM | POA: Diagnosis not present

## 2021-07-01 DIAGNOSIS — J449 Chronic obstructive pulmonary disease, unspecified: Secondary | ICD-10-CM | POA: Diagnosis not present

## 2021-07-01 DIAGNOSIS — I110 Hypertensive heart disease with heart failure: Secondary | ICD-10-CM | POA: Diagnosis not present

## 2021-07-01 DIAGNOSIS — R826 Abnormal urine levels of substances chiefly nonmedicinal as to source: Secondary | ICD-10-CM | POA: Diagnosis not present

## 2021-07-01 DIAGNOSIS — G894 Chronic pain syndrome: Secondary | ICD-10-CM | POA: Diagnosis not present

## 2021-07-01 DIAGNOSIS — Z7689 Persons encountering health services in other specified circumstances: Secondary | ICD-10-CM | POA: Diagnosis not present

## 2021-07-05 ENCOUNTER — Other Ambulatory Visit (HOSPITAL_COMMUNITY): Payer: Self-pay | Admitting: Cardiology

## 2021-07-05 IMAGING — DX DG HIP (WITH OR WITHOUT PELVIS) 2-3V RIGHT
3 series · 3 of 3 positions shown · non-contrast
Comparison: 07/06/2018

CLINICAL DATA: Recent fall with right hip pain, initial encounter

EXAM:
DG HIP (WITH OR WITHOUT PELVIS) 2-3V RIGHT

[pelvis ap]
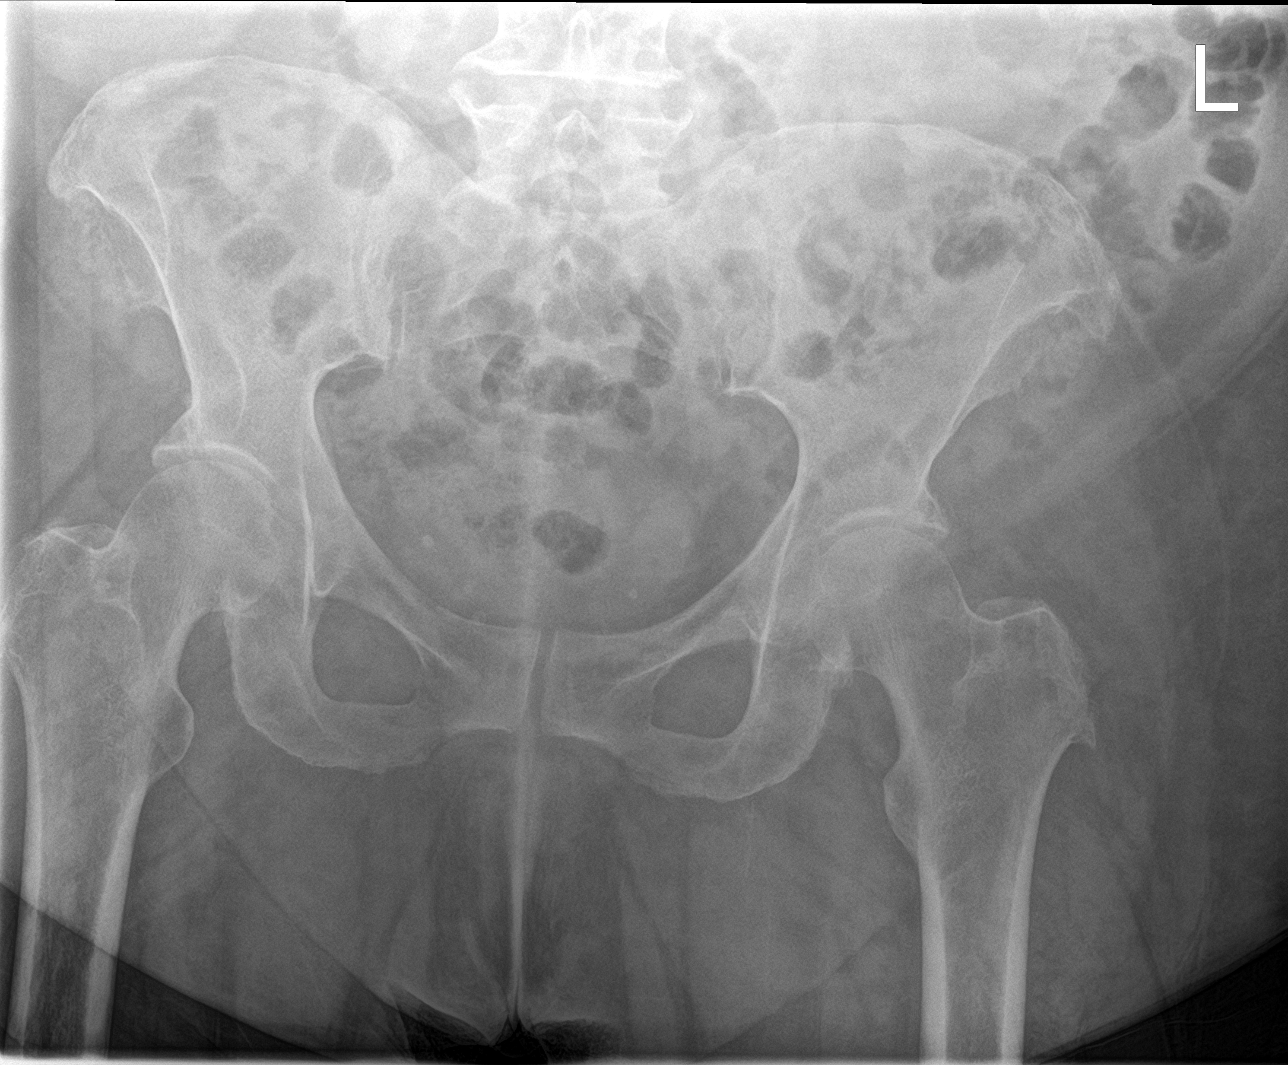

[hip ap]
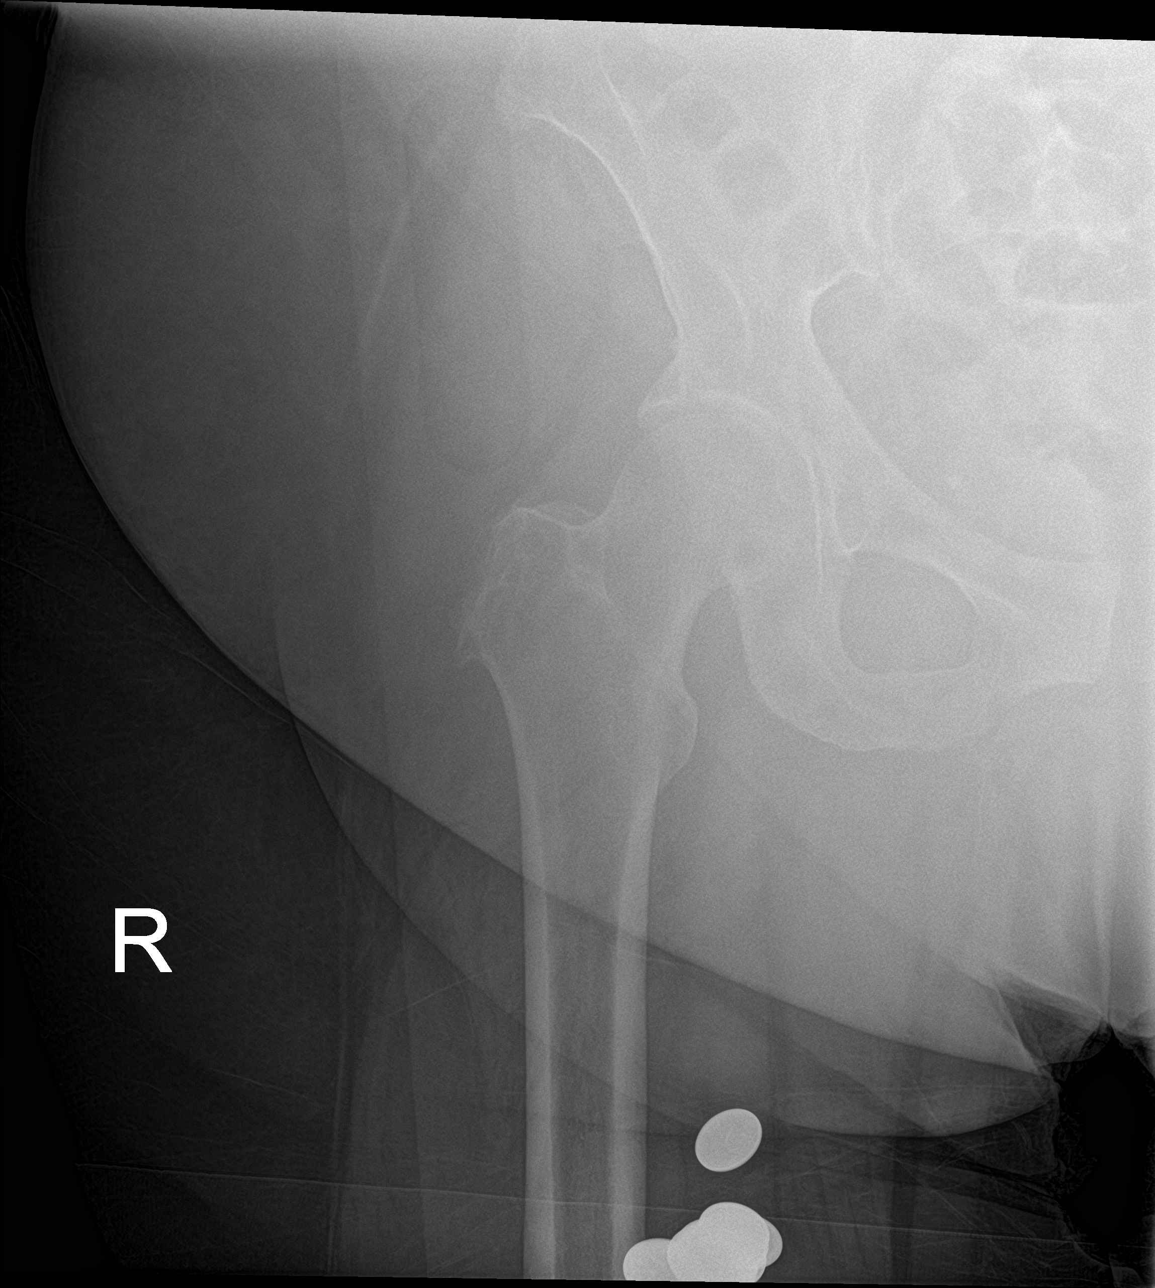

[hip lat]
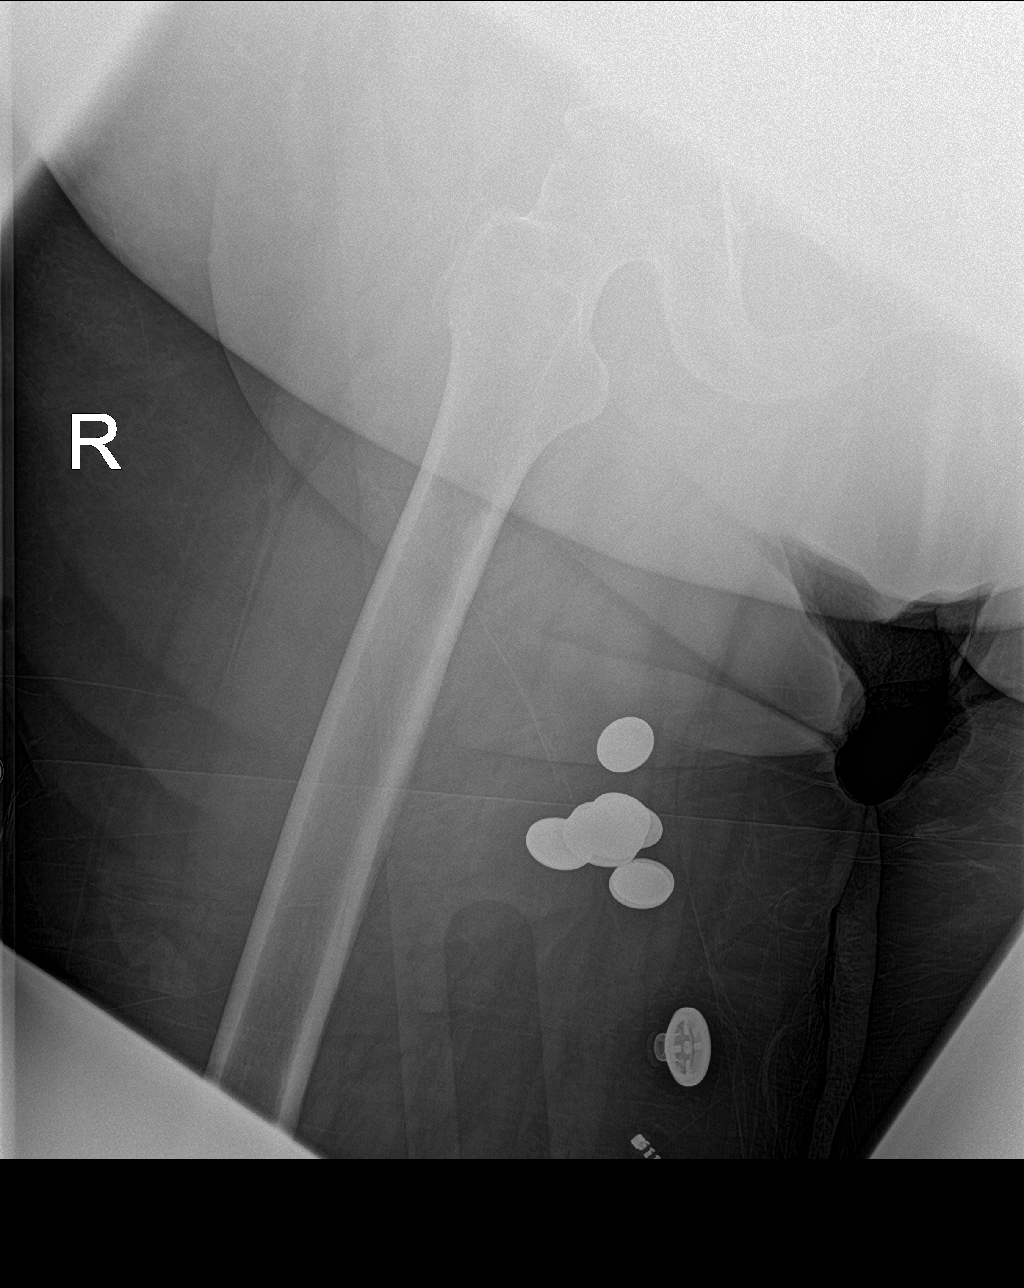

[3 of 3 positions shown; findings below may reference images not displayed]

FINDINGS: Pelvic ring is intact. Mild degenerative changes of the hip joints
are noted bilaterally. No acute fracture or dislocation is seen. No
soft tissue abnormality is noted.
IMPRESSION: No acute abnormality noted.
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
## 2021-07-13 DIAGNOSIS — I110 Hypertensive heart disease with heart failure: Secondary | ICD-10-CM | POA: Diagnosis not present

## 2021-07-18 DIAGNOSIS — I110 Hypertensive heart disease with heart failure: Secondary | ICD-10-CM | POA: Diagnosis not present

## 2021-07-19 DIAGNOSIS — I110 Hypertensive heart disease with heart failure: Secondary | ICD-10-CM | POA: Diagnosis not present

## 2021-07-19 DIAGNOSIS — M25561 Pain in right knee: Secondary | ICD-10-CM | POA: Diagnosis not present

## 2021-07-19 DIAGNOSIS — M25562 Pain in left knee: Secondary | ICD-10-CM | POA: Diagnosis not present

## 2021-07-20 DIAGNOSIS — I110 Hypertensive heart disease with heart failure: Secondary | ICD-10-CM | POA: Diagnosis not present

## 2021-07-21 DIAGNOSIS — I110 Hypertensive heart disease with heart failure: Secondary | ICD-10-CM | POA: Diagnosis not present

## 2021-07-22 DIAGNOSIS — I110 Hypertensive heart disease with heart failure: Secondary | ICD-10-CM | POA: Diagnosis not present

## 2021-08-15 DIAGNOSIS — I110 Hypertensive heart disease with heart failure: Secondary | ICD-10-CM | POA: Diagnosis not present

## 2021-08-16 DIAGNOSIS — I110 Hypertensive heart disease with heart failure: Secondary | ICD-10-CM | POA: Diagnosis not present

## 2021-08-17 DIAGNOSIS — I110 Hypertensive heart disease with heart failure: Secondary | ICD-10-CM | POA: Diagnosis not present

## 2021-08-18 DIAGNOSIS — E119 Type 2 diabetes mellitus without complications: Secondary | ICD-10-CM | POA: Diagnosis not present

## 2021-08-18 DIAGNOSIS — I110 Hypertensive heart disease with heart failure: Secondary | ICD-10-CM | POA: Diagnosis not present

## 2021-08-18 DIAGNOSIS — E785 Hyperlipidemia, unspecified: Secondary | ICD-10-CM | POA: Diagnosis not present

## 2021-08-18 DIAGNOSIS — E039 Hypothyroidism, unspecified: Secondary | ICD-10-CM | POA: Diagnosis not present

## 2021-08-19 DIAGNOSIS — I110 Hypertensive heart disease with heart failure: Secondary | ICD-10-CM | POA: Diagnosis not present

## 2021-08-22 DIAGNOSIS — I110 Hypertensive heart disease with heart failure: Secondary | ICD-10-CM | POA: Diagnosis not present

## 2021-08-23 DIAGNOSIS — I110 Hypertensive heart disease with heart failure: Secondary | ICD-10-CM | POA: Diagnosis not present

## 2021-08-24 DIAGNOSIS — I110 Hypertensive heart disease with heart failure: Secondary | ICD-10-CM | POA: Diagnosis not present

## 2021-08-25 ENCOUNTER — Encounter (HOSPITAL_COMMUNITY): Payer: Medicaid Other | Admitting: Cardiology

## 2021-08-25 DIAGNOSIS — I110 Hypertensive heart disease with heart failure: Secondary | ICD-10-CM | POA: Diagnosis not present

## 2021-08-29 ENCOUNTER — Emergency Department (HOSPITAL_COMMUNITY): Payer: Medicaid Other

## 2021-08-29 ENCOUNTER — Emergency Department (HOSPITAL_COMMUNITY)
Admission: EM | Admit: 2021-08-29 | Discharge: 2021-08-30 | Disposition: A | Discharge status: Home / Self Care | Payer: Medicaid Other | Attending: Emergency Medicine | Admitting: Emergency Medicine

## 2021-08-29 ENCOUNTER — Other Ambulatory Visit: Payer: Self-pay

## 2021-08-29 DIAGNOSIS — M109 Gout, unspecified: Secondary | ICD-10-CM | POA: Diagnosis not present

## 2021-08-29 DIAGNOSIS — I509 Heart failure, unspecified: Secondary | ICD-10-CM | POA: Insufficient documentation

## 2021-08-29 DIAGNOSIS — J189 Pneumonia, unspecified organism: Secondary | ICD-10-CM | POA: Insufficient documentation

## 2021-08-29 DIAGNOSIS — R000 Tachycardia, unspecified: Secondary | ICD-10-CM | POA: Diagnosis not present

## 2021-08-29 DIAGNOSIS — Z79899 Other long term (current) drug therapy: Secondary | ICD-10-CM | POA: Diagnosis not present

## 2021-08-29 DIAGNOSIS — Z7982 Long term (current) use of aspirin: Secondary | ICD-10-CM | POA: Diagnosis not present

## 2021-08-29 DIAGNOSIS — R079 Chest pain, unspecified: Secondary | ICD-10-CM | POA: Diagnosis not present

## 2021-08-29 DIAGNOSIS — R0602 Shortness of breath: Secondary | ICD-10-CM | POA: Diagnosis not present

## 2021-08-29 DIAGNOSIS — R0789 Other chest pain: Secondary | ICD-10-CM | POA: Diagnosis not present

## 2021-08-29 DIAGNOSIS — G8929 Other chronic pain: Secondary | ICD-10-CM | POA: Diagnosis not present

## 2021-08-29 DIAGNOSIS — M25562 Pain in left knee: Secondary | ICD-10-CM | POA: Diagnosis not present

## 2021-08-29 DIAGNOSIS — Z743 Need for continuous supervision: Secondary | ICD-10-CM | POA: Diagnosis not present

## 2021-08-29 DIAGNOSIS — M25561 Pain in right knee: Secondary | ICD-10-CM | POA: Diagnosis not present

## 2021-08-29 LAB — CBC WITH DIFFERENTIAL/PLATELET
Abs Immature Granulocytes: 0.11 10*3/uL — ABNORMAL HIGH (ref 0.00–0.07)
Basophils Absolute: 0.1 10*3/uL (ref 0.0–0.1)
Basophils Relative: 0 %
Eosinophils Absolute: 0.2 10*3/uL (ref 0.0–0.5)
Eosinophils Relative: 1 %
HCT: 36.8 % (ref 36.0–46.0)
Hemoglobin: 11.6 g/dL — ABNORMAL LOW (ref 12.0–15.0)
Immature Granulocytes: 1 %
Lymphocytes Relative: 16 %
Lymphs Abs: 3.3 10*3/uL (ref 0.7–4.0)
MCH: 29.4 pg (ref 26.0–34.0)
MCHC: 31.5 g/dL (ref 30.0–36.0)
MCV: 93.4 fL (ref 80.0–100.0)
Monocytes Absolute: 1.2 10*3/uL — ABNORMAL HIGH (ref 0.1–1.0)
Monocytes Relative: 6 %
Neutro Abs: 15.3 10*3/uL — ABNORMAL HIGH (ref 1.7–7.7)
Neutrophils Relative %: 76 %
Platelets: 408 10*3/uL — ABNORMAL HIGH (ref 150–400)
RBC: 3.94 MIL/uL (ref 3.87–5.11)
RDW: 13.2 % (ref 11.5–15.5)
WBC: 20.1 10*3/uL — ABNORMAL HIGH (ref 4.0–10.5)
nRBC: 0 % (ref 0.0–0.2)

## 2021-08-29 LAB — TROPONIN I (HIGH SENSITIVITY): Troponin I (High Sensitivity): 7 ng/L (ref <18)

## 2021-08-29 LAB — COMPREHENSIVE METABOLIC PANEL
ALT: 18 U/L (ref 0–44)
AST: 15 U/L (ref 15–41)
Albumin: 4 g/dL (ref 3.5–5.0)
Alkaline Phosphatase: 56 U/L (ref 38–126)
Anion gap: 11 (ref 5–15)
BUN: 12 mg/dL (ref 8–23)
CO2: 25 mmol/L (ref 22–32)
Calcium: 8.9 mg/dL (ref 8.9–10.3)
Chloride: 97 mmol/L — ABNORMAL LOW (ref 98–111)
Creatinine, Ser: 0.92 mg/dL (ref 0.44–1.00)
GFR, Estimated: >60 mL/min (ref >60)
Glucose, Bld: 100 mg/dL — ABNORMAL HIGH (ref 70–99)
Potassium: 3.7 mmol/L (ref 3.5–5.1)
Sodium: 133 mmol/L — ABNORMAL LOW (ref 135–145)
Total Bilirubin: 0.8 mg/dL (ref 0.3–1.2)
Total Protein: 7.4 g/dL (ref 6.5–8.1)

## 2021-08-29 NOTE — ED Triage Notes (Signed)
Pt here from home via [facility] for cp x1 week but also c/o gout pain. 140/78, 97% on 3L O2 via nasal cannula (baseline), CBG 134

## 2021-08-29 NOTE — ED Provider Triage Note (Signed)
Emergency Medicine Provider Triage Evaluation Note  Michele Owens , a 61 y.o. female  was evaluated in triage.  Patient presents with multiple complaints- primary being chest pain x 1 week. Central chest, stabbing in nature, occurring intermittently. Notes some dyspnea, reports hx of asthma, also wears 3L of O2 via [name] @ baseline. She also mentions generalized pain, more so to her left knee.   Review of Systems  Positive: Chest pain, arthralgia, dyspnea Negative: N/v, abdominal pain, syncope  Physical Exam  BP (!) 106/93 (BP Location: Left Arm)   Pulse (!) 102   Temp 98.2 F (36.8 C)   Resp 18   SpO2 100%  Gen:   Awake, no distress   Resp:  Normal effort on 3L via [name].  MSK:   L knee TTP.  Other:  2+ PT pulses.   Medical Decision Making  Medically screening exam initiated at 10:28 PM.  Appropriate orders placed.  Michele Owens was informed that the remainder of the evaluation will be completed by another provider, this initial triage assessment does not replace that evaluation, and the importance of remaining in the ED until their evaluation is complete.  Chest pain   Michele Dyke, PA-C 08/29/21 2321

## 2021-08-30 LAB — TROPONIN I (HIGH SENSITIVITY): Troponin I (High Sensitivity): 6 ng/L (ref <18)

## 2021-08-30 LAB — LACTIC ACID, PLASMA: Lactic Acid, Venous: 1.4 mmol/L (ref 0.5–1.9)

## 2021-08-30 LAB — BRAIN NATRIURETIC PEPTIDE: B Natriuretic Peptide: 26.8 pg/mL (ref 0.0–100.0)

## 2021-08-30 LAB — D-DIMER, QUANTITATIVE: D-Dimer, Quant: <0.27 ug/mL-FEU (ref 0.00–0.50)

## 2021-08-30 MED ORDER — CEFDINIR 300 MG PO CAPS: 300.0000 mg | ORAL_CAPSULE | Freq: Two times a day (BID) | ORAL | 0 refills | Status: DC

## 2021-08-30 MED ORDER — DOXYCYCLINE HYCLATE 100 MG PO CAPS: 100.0000 mg | ORAL_CAPSULE | Freq: Two times a day (BID) | ORAL | 0 refills | Status: DC

## 2021-08-30 MED ORDER — SODIUM CHLORIDE 0.9 % IV SOLN
1.0000 g | Freq: Once | INTRAVENOUS | Status: AC
  Administered 2021-08-30: 1 g via INTRAVENOUS
  Filled 2021-08-30: qty 10

## 2021-08-30 MED ORDER — DOXYCYCLINE HYCLATE 100 MG PO TABS
100.0000 mg | ORAL_TABLET | Freq: Once | ORAL | Status: AC
  Administered 2021-08-30: 100 mg via ORAL
  Filled 2021-08-30 (×2): qty 1

## 2021-08-30 NOTE — ED Provider Notes (Signed)
[facility] EMERGENCY DEPARTMENT Provider Note   CSN: [id number] Arrival date & time: 08/29/21  2213     History  Chief Complaint  Patient presents with   Chest Pain   Gout pain    Michele Owens is a 61 y.o. female.  The history is provided by the patient and medical records.  Chest Pain Michele Owens is a 61 y.o. female who presents to the Emergency Department complaining of multiple complaints.  She presents to the emergency department complaining of acute on chronic knee pain, acute on chronic chest pain.  She states that she has not felt well for a week.  She also reports being under increased stress after having her son was killed on May 3.  Over the last week she has been experiencing hot and cold episodes and she states that she is more short of breath than her baseline.  In terms of her knee pain this has been present for many years.  She feels like both of her legs are more swollen than usual, left greater than right.  No reported fevers.  She does have a chronic cough, productive of clear sputum.  She reports right-sided chest pain that comes and goes.  Her chest pain is an ongoing issue.  She has a history of CHF, wears 3 L oxygen at baseline.    Home Medications Prior to Admission medications   Medication Sig Start Date End Date Taking? Authorizing Provider  cefdinir (OMNICEF) 300 MG capsule Take 1 capsule (300 mg total) by mouth 2 (two) times daily. 08/30/21  Yes Quintella Reichert, MD  doxycycline (VIBRAMYCIN) 100 MG capsule Take 1 capsule (100 mg total) by mouth 2 (two) times daily. 08/30/21  Yes Quintella Reichert, MD  acetaminophen (TYLENOL) 500 MG tablet Take 1,000 mg by mouth every 6 (six) hours as needed for moderate pain or headache.    [provider]  albuterol (PROVENTIL HFA;VENTOLIN HFA) 108 (90 Base) MCG/ACT inhaler Inhale 1-2 puffs into the lungs every 6 (six) hours as needed for wheezing or shortness of breath. 04/23/18   Long, [name], MD   allopurinol (ZYLOPRIM) 100 MG tablet Take 100 mg by mouth daily.    [provider]  aspirin EC 81 MG tablet Take 81 mg by mouth daily. Swallow whole.    [provider]  benzonatate (TESSALON) 200 MG capsule Take 1 capsule (200 mg total) by mouth in the morning, at noon, and at bedtime. 7 DS Patient taking differently: Take 200 mg by mouth 3 (three) times daily as needed for cough. 04/13/20   Regalado, Belkys A, MD  blood glucose meter kit and supplies KIT Dispense based on patient and insurance preference. Use up to four times daily as directed. 12/25/20   Little Ishikawa, MD  busPIRone (BUSPAR) 5 MG tablet Take 5 mg by mouth 3 (three) times daily as needed (anxiety).    [provider]  Butalbital-APAP-Caffeine 50-300-40 MG CAPS Take 1 capsule by mouth 2 (two) times daily as needed for headache.    [provider]  calcitRIOL (ROCALTROL) 0.5 MCG capsule Take 1 capsule (0.5 mcg total) by mouth daily. 09/26/16   Hongalgi, [name], MD  carvedilol (COREG) 6.25 MG tablet Take 1 tablet (6.25 mg total) by mouth 2 (two) times daily with a meal. 01/23/18   Bensimhon, [name], MD  clonazePAM (KLONOPIN) 2 MG tablet Take 1 tablet (2 mg total) by mouth 3 (three) times daily. for anxiety Patient taking differently: Take  2 mg by mouth 3 (three) times daily. 06/23/19   [name], MD  cyclobenzaprine (FLEXERIL) 10 MG tablet Take 10 mg by mouth 3 (three) times daily. 11/19/19   [provider]  diclofenac sodium (VOLTAREN) 1 % GEL Apply 4 g topically 4 (four) times daily as needed (for pain). 04/27/21   Varney Biles, MD  dicyclomine (BENTYL) 20 MG tablet Take 1 tablet (20 mg total) by mouth 2 (two) times daily as needed for spasms (abdominal cramping). 06/10/21   Mesner, Corene Cornea, MD  doxycycline (VIBRAMYCIN) 100 MG capsule Take 1 capsule (100 mg total) by mouth 2 (two) times daily. One po bid x 7 days Patient not taking: Reported on 06/27/2021 03/16/21   Deno Etienne,  DO  EPINEPHrine 0.3 mg/0.3 mL IJ SOAJ injection Inject 0.3 mg into the muscle once as needed for anaphylaxis.    [provider]  fenofibrate (TRICOR) 145 MG tablet Take 145 mg by mouth daily. 06/07/21   [provider]  FEROSUL 325 (65 Fe) MG tablet Take 325 mg by mouth daily. 03/22/17   [provider]  glucose blood test strip Use as instructed to check blood sugar up to 4 times daily Patient taking differently: 1 each by Other route See admin instructions. Use as instructed to check blood sugar up to 4 times daily 08/06/17   [name], DO  guaiFENesin (DIABETIC SILTUSSIN DAS-NA PO) Take 5 mLs by mouth at bedtime as needed (cough).    [provider]  ipratropium-albuterol (DUONEB) 0.5-2.5 (3) MG/3ML SOLN Take 3 mLs by nebulization 3 (three) times daily. 04/05/17   [provider]  levothyroxine (SYNTHROID, LEVOTHROID) 112 MCG tablet Take 224 mcg by mouth daily before breakfast. 07/11/17   [provider]  lisinopril (PRINIVIL,ZESTRIL) 5 MG tablet Take 1 tablet (5 mg total) by mouth daily. Please call for office visit [phone] Patient taking differently: Take 5 mg by mouth daily. 08/15/17   [name], MD  loperamide (IMODIUM) 2 MG capsule Take 2 mg by mouth 4 (four) times daily as needed. 05/09/21   [provider]  loratadine (CLARITIN) 10 MG tablet Take 10 mg by mouth daily.    [provider]  metFORMIN (GLUCOPHAGE) 500 MG tablet Take 1 tablet (500 mg total) by mouth 2 (two) times daily with a meal. 12/11/14   [name], MD  nitroGLYCERIN (NITROSTAT) 0.4 MG SL tablet Place 1 tablet (0.4 mg total) under the tongue every 5 (five) minutes as needed for chest pain. 06/19/18   [name], MD  omeprazole (PRILOSEC) 20 MG capsule Take 20 mg by mouth 2 (two) times daily. 05/19/21   [provider]  ondansetron (ZOFRAN-ODT) 4 MG disintegrating tablet 54m ODT q4 hours prn nausea/vomit Patient taking  differently: Take 4 mg by mouth every 8 (eight) hours as needed for vomiting or nausea. 03/09/21   YDrenda Freeze MD  oxyCODONE-acetaminophen (PERCOCET) 10-325 MG tablet Take 1 tablet by mouth every 6 (six) hours as needed for pain. 12/03/19   [provider]  OXYGEN Inhale 4 L into the lungs continuous.    [provider]  phentermine (ADIPEX-P) 37.5 MG tablet Take 37.5 mg by mouth daily. 05/25/21   [provider]  potassium chloride SA (K-DUR,KLOR-CON) 20 MEQ tablet Take 1 tablet (20 mEq total) by mouth 2 (two) times daily. 07/24/18   MLarey Dresser MD  rosuvastatin (CRESTOR) 10 MG tablet Take 10 mg by mouth every evening. 11/19/19   [provider]  senna-docusate (SENOKOT-S) 8.6-50 MG tablet Take 1 tablet by mouth daily.    [provider]  torsemide (DEMADEX) 20 MG tablet TAKE 2 TABLETS (40 MG TOTAL) BY MOUTH 2 (TWO) TIMES DAILY. 07/05/21   [name], MD  traZODone (DESYREL) 50 MG tablet Take 100 mg by mouth at bedtime. 11/19/19   [provider]  triamcinolone cream (KENALOG) 0.1 % Apply 1 application. topically 2 (two) times daily as needed (irrritation). 09/10/20   [provider]  umeclidinium-vilanterol (ANORO ELLIPTA) 62.5-25 MCG/INH AEPB Inhale 1 puff into the lungs daily.    [provider]      Allergies    Bee venom, Fioricet [butalbital-apap-caffeine], Ibuprofen, Lamisil [terbinafine], and Nsaids    Review of Systems   Review of Systems  Cardiovascular:  Positive for chest pain.  All other systems reviewed and are negative.  Physical Exam Updated Vital Signs BP 109/63   Pulse 92   Temp 98.2 F (36.8 C)   Resp (!) 21   SpO2 97%  Physical Exam Vitals and nursing note reviewed.  Constitutional:      Appearance: She is well-developed.  HENT:     Head: Normocephalic and atraumatic.  Cardiovascular:     Rate and Rhythm: Normal rate and regular rhythm.     Heart sounds: No murmur  heard. Pulmonary:     Effort: Pulmonary effort is normal. No respiratory distress.     Breath sounds: Normal breath sounds.  Abdominal:     Palpations: Abdomen is soft.     Tenderness: There is no abdominal tenderness. There is no guarding or rebound.  Musculoskeletal:        General: No tenderness.     Comments: Trace edema to bilateral lower extremities, 2+ DP pulses.  There is tenderness to palpation over the left knee with no palpable effusion.  There is no overlying erythema.  Skin:    General: Skin is warm and dry.  Neurological:     Mental Status: She is alert and oriented to person, place, and time.  Psychiatric:        Behavior: Behavior normal.    ED Results / Procedures / Treatments   Labs (all labs ordered are listed, but only abnormal results are displayed) Labs Reviewed  COMPREHENSIVE METABOLIC PANEL - Abnormal; Notable for the following components:      Result Value   Sodium 133 (*)    Chloride 97 (*)    Glucose, Bld 100 (*)    All other components within normal limits  CBC WITH DIFFERENTIAL/PLATELET - Abnormal; Notable for the following components:   WBC 20.1 (*)    Hemoglobin 11.6 (*)    Platelets 408 (*)    Neutro Abs 15.3 (*)    Monocytes Absolute 1.2 (*)    Abs Immature Granulocytes 0.11 (*)    All other components within normal limits  CULTURE, BLOOD (ROUTINE X 2)  CULTURE, BLOOD (ROUTINE X 2)  LACTIC ACID, PLASMA  BRAIN NATRIURETIC PEPTIDE  D-DIMER, QUANTITATIVE  LACTIC ACID, PLASMA  TROPONIN I (HIGH SENSITIVITY)  TROPONIN I (HIGH SENSITIVITY)    EKG EKG Interpretation  Date/Time:  Monday Aug 29 2021 22:29:01 EDT Ventricular Rate:  106 PR Interval:  166 QRS Duration: 66 QT Interval:  340 QTC Calculation: 451 R Axis:   53 Text Interpretation: Sinus tachycardia Cannot rule out Anterior infarct , age undetermined Abnormal ECG Confirmed by Quintella Reichert [phone]) on 08/30/2021 3:44:18 AM  Radiology DG Chest 2 View  Result Date:  08/29/2021 CLINICAL DATA:  Chest pain. EXAM: CHEST - 2 VIEW COMPARISON:  06/26/2021. FINDINGS: The heart size and mediastinal contours are within normal limits. There is atherosclerotic calcification of the aorta. Strandy opacities are noted at the lung bases, greater on the left than on the right. No effusion or pneumothorax. Degenerative changes are present in the thoracic spine. IMPRESSION: Strandy opacities at the lung bases, slightly increased from the prior exam, possible atelectasis or infiltrate. Electronically Signed   By: Brett Fairy M.D.   On: 08/29/2021 23:34   DG Knee Complete 4 Views Left  Result Date: 08/29/2021 CLINICAL DATA:  Left knee pain. EXAM: LEFT KNEE - COMPLETE 4+ VIEW COMPARISON:  04/27/2021. FINDINGS: No acute fracture or dislocation. There are mild to moderate tricompartmental degenerative changes. A trace suprapatellar joint effusion is noted IMPRESSION: 1. Mild-to-moderate degenerative changes. 2. Trace suprapatellar joint effusion. Electronically Signed   By: Brett Fairy M.D.   On: 08/29/2021 23:36    Procedures Procedures    Medications Ordered in ED Medications  cefTRIAXone (ROCEPHIN) 1 g in sodium chloride 0.9 % 100 mL IVPB (0 g Intravenous Stopped 08/30/21 0626)  doxycycline (VIBRA-TABS) tablet 100 mg (100 mg Oral Given 08/30/21 0557)    ED Course/ Medical Decision Making/ A&P                           Medical Decision Making Amount and/or Complexity of Data Reviewed Labs: ordered.  Risk Prescription drug management.   Patient with history of CHF on chronic oxygen, diabetes here for evaluation of feeling poorly, chest discomfort, shortness of breath as well as acute on chronic knee pain.  She is nontoxic-appearing on evaluation and in no acute distress.  Troponins are negative x2 and EKG without acute ischemic changes.  Chest x-ray with atelectasis versus infiltrates, images personally reviewed and interpreted.  CBC with leukocytosis, slightly increased  when compared to priors.  Her lactic acid is within normal limits.  BNP as well as D-dimer are within normal limits.  Current clinical picture is not consistent with PE, DVT, ACS, decompensated CHF.  Given her constellation of symptoms suspect there may be an early developing pneumonia without sepsis.  Will start on antibiotics.  In terms of her knee pain-this is chronic in nature.  Current picture is not consistent with acute septic or gouty arthritis.  Feel patient is stable for discharge home for outpatient treatment of community-acquired pneumonia.  Counseled patient on home care, outpatient follow-up and return precautions.        Final Clinical Impression(s) / ED Diagnoses Final diagnoses:  Community acquired pneumonia of left lower lobe of lung  Chronic pain of left knee    Rx / DC Orders ED Discharge Orders          Ordered    cefdinir (OMNICEF) 300 MG capsule  2 times daily        08/30/21 0657    doxycycline (VIBRAMYCIN) 100 MG capsule  2 times daily        08/30/21 0657              Quintella Reichert, MD 08/30/21 [phone]

## 2021-09-04 LAB — CULTURE, BLOOD (ROUTINE X 2)
Culture: NO GROWTH
Culture: NO GROWTH

## 2021-09-06 ENCOUNTER — Encounter: Payer: Self-pay | Admitting: Emergency Medicine

## 2021-09-14 ENCOUNTER — Emergency Department (HOSPITAL_COMMUNITY)
Admission: EM | Admit: 2021-09-14 | Discharge: 2021-09-15 | Disposition: A | Discharge status: Home / Self Care | Payer: Medicaid Other | Attending: Emergency Medicine | Admitting: Emergency Medicine

## 2021-09-14 ENCOUNTER — Other Ambulatory Visit: Payer: Self-pay

## 2021-09-14 ENCOUNTER — Encounter (HOSPITAL_COMMUNITY): Payer: Self-pay

## 2021-09-14 ENCOUNTER — Emergency Department (HOSPITAL_COMMUNITY): Payer: Medicaid Other

## 2021-09-14 DIAGNOSIS — R059 Cough, unspecified: Secondary | ICD-10-CM | POA: Diagnosis not present

## 2021-09-14 DIAGNOSIS — E890 Postprocedural hypothyroidism: Secondary | ICD-10-CM | POA: Diagnosis not present

## 2021-09-14 DIAGNOSIS — R1084 Generalized abdominal pain: Secondary | ICD-10-CM | POA: Diagnosis not present

## 2021-09-14 DIAGNOSIS — R9431 Abnormal electrocardiogram [ECG] [EKG]: Secondary | ICD-10-CM | POA: Diagnosis not present

## 2021-09-14 DIAGNOSIS — Z7951 Long term (current) use of inhaled steroids: Secondary | ICD-10-CM | POA: Insufficient documentation

## 2021-09-14 DIAGNOSIS — R600 Localized edema: Secondary | ICD-10-CM | POA: Insufficient documentation

## 2021-09-14 DIAGNOSIS — R531 Weakness: Secondary | ICD-10-CM | POA: Diagnosis not present

## 2021-09-14 DIAGNOSIS — E119 Type 2 diabetes mellitus without complications: Secondary | ICD-10-CM | POA: Diagnosis not present

## 2021-09-14 DIAGNOSIS — Z743 Need for continuous supervision: Secondary | ICD-10-CM | POA: Diagnosis not present

## 2021-09-14 DIAGNOSIS — I110 Hypertensive heart disease with heart failure: Secondary | ICD-10-CM | POA: Diagnosis not present

## 2021-09-14 DIAGNOSIS — I5033 Acute on chronic diastolic (congestive) heart failure: Secondary | ICD-10-CM | POA: Insufficient documentation

## 2021-09-14 DIAGNOSIS — Z7982 Long term (current) use of aspirin: Secondary | ICD-10-CM | POA: Diagnosis not present

## 2021-09-14 DIAGNOSIS — J449 Chronic obstructive pulmonary disease, unspecified: Secondary | ICD-10-CM | POA: Insufficient documentation

## 2021-09-14 DIAGNOSIS — I5023 Acute on chronic systolic (congestive) heart failure: Secondary | ICD-10-CM | POA: Diagnosis not present

## 2021-09-14 DIAGNOSIS — Z7984 Long term (current) use of oral hypoglycemic drugs: Secondary | ICD-10-CM | POA: Diagnosis not present

## 2021-09-14 DIAGNOSIS — I6521 Occlusion and stenosis of right carotid artery: Secondary | ICD-10-CM | POA: Diagnosis not present

## 2021-09-14 DIAGNOSIS — R0602 Shortness of breath: Secondary | ICD-10-CM | POA: Diagnosis present

## 2021-09-14 DIAGNOSIS — R609 Edema, unspecified: Secondary | ICD-10-CM | POA: Diagnosis not present

## 2021-09-14 DIAGNOSIS — J9811 Atelectasis: Secondary | ICD-10-CM | POA: Diagnosis not present

## 2021-09-14 LAB — LIPASE, BLOOD: Lipase: 24 U/L (ref 11–51)

## 2021-09-14 LAB — COMPREHENSIVE METABOLIC PANEL
ALT: 15 U/L (ref 0–44)
AST: 16 U/L (ref 15–41)
Albumin: 3.4 g/dL — ABNORMAL LOW (ref 3.5–5.0)
Alkaline Phosphatase: 61 U/L (ref 38–126)
Anion gap: 9 (ref 5–15)
BUN: 12 mg/dL (ref 8–23)
CO2: 33 mmol/L — ABNORMAL HIGH (ref 22–32)
Calcium: 8.6 mg/dL — ABNORMAL LOW (ref 8.9–10.3)
Chloride: 98 mmol/L (ref 98–111)
Creatinine, Ser: 1.04 mg/dL — ABNORMAL HIGH (ref 0.44–1.00)
GFR, Estimated: >60 mL/min (ref >60)
Glucose, Bld: 98 mg/dL (ref 70–99)
Potassium: 4.2 mmol/L (ref 3.5–5.1)
Sodium: 140 mmol/L (ref 135–145)
Total Bilirubin: 0.3 mg/dL (ref 0.3–1.2)
Total Protein: 7 g/dL (ref 6.5–8.1)

## 2021-09-14 LAB — CBC WITH DIFFERENTIAL/PLATELET
Abs Immature Granulocytes: 0.24 10*3/uL — ABNORMAL HIGH (ref 0.00–0.07)
Basophils Absolute: 0.1 10*3/uL (ref 0.0–0.1)
Basophils Relative: 1 %
Eosinophils Absolute: 1.1 10*3/uL — ABNORMAL HIGH (ref 0.0–0.5)
Eosinophils Relative: 6 %
HCT: 35.5 % — ABNORMAL LOW (ref 36.0–46.0)
Hemoglobin: 10.9 g/dL — ABNORMAL LOW (ref 12.0–15.0)
Immature Granulocytes: 1 %
Lymphocytes Relative: 15 %
Lymphs Abs: 2.5 10*3/uL (ref 0.7–4.0)
MCH: 29.3 pg (ref 26.0–34.0)
MCHC: 30.7 g/dL (ref 30.0–36.0)
MCV: 95.4 fL (ref 80.0–100.0)
Monocytes Absolute: 0.7 10*3/uL (ref 0.1–1.0)
Monocytes Relative: 4 %
Neutro Abs: 12.1 10*3/uL — ABNORMAL HIGH (ref 1.7–7.7)
Neutrophils Relative %: 73 %
Platelets: 249 10*3/uL (ref 150–400)
RBC: 3.72 MIL/uL — ABNORMAL LOW (ref 3.87–5.11)
RDW: 13.5 % (ref 11.5–15.5)
WBC: 16.7 10*3/uL — ABNORMAL HIGH (ref 4.0–10.5)
nRBC: 0 % (ref 0.0–0.2)

## 2021-09-14 LAB — BRAIN NATRIURETIC PEPTIDE: B Natriuretic Peptide: 12.7 pg/mL (ref 0.0–100.0)

## 2021-09-14 LAB — TROPONIN I (HIGH SENSITIVITY): Troponin I (High Sensitivity): 5 ng/L (ref <18)

## 2021-09-14 NOTE — ED Triage Notes (Signed)
Pt arrives EMS from home with c/o lingering productive cough with brown sputum. 3L [place] at baseline.   Dx pna 1 week ago and finished abx.   Sts chronic left knee pain.

## 2021-09-14 NOTE — ED Provider Triage Note (Signed)
Emergency Medicine Provider Triage Evaluation Note  Michele Owens , a 61 y.o. female  was evaluated in triage.  Pt complains of multiple complaints.  Brought in by EMS due to pneumonia 1 week ago, she is coughing and having slight episodes of hematemesis.  Also complaining of gas and pain all over her abdomen.  On 3 L of oxygen chronically.  Finished antibiotics..  Review of Systems  Per HPI  Physical Exam  There were no vitals taken for this visit. Gen:   Awake, no distress   Resp:  Normal effort  MSK:   Moves extremities without difficulty  Other:  Diffuse abdominal tenderness without rigidity or guarding  Medical Decision Making  Medically screening exam initiated at 9:04 PM.  Appropriate orders placed.  Michele Owens was informed that the remainder of the evaluation will be completed by another provider, this initial triage assessment does not replace that evaluation, and the importance of remaining in the ED until their evaluation is complete.     Sherrill Raring, PA-C 09/14/21 2105

## 2021-09-15 ENCOUNTER — Emergency Department (HOSPITAL_COMMUNITY): Payer: Medicaid Other

## 2021-09-15 DIAGNOSIS — E890 Postprocedural hypothyroidism: Secondary | ICD-10-CM | POA: Diagnosis not present

## 2021-09-15 DIAGNOSIS — R531 Weakness: Secondary | ICD-10-CM | POA: Diagnosis not present

## 2021-09-15 DIAGNOSIS — J9811 Atelectasis: Secondary | ICD-10-CM | POA: Diagnosis not present

## 2021-09-15 DIAGNOSIS — I6521 Occlusion and stenosis of right carotid artery: Secondary | ICD-10-CM | POA: Diagnosis not present

## 2021-09-15 LAB — I-STAT ARTERIAL BLOOD GAS, ED
Acid-Base Excess: 8 mmol/L — ABNORMAL HIGH (ref 0.0–2.0)
Bicarbonate: 35.9 mmol/L — ABNORMAL HIGH (ref 20.0–28.0)
Calcium, Ion: 1.17 mmol/L (ref 1.15–1.40)
HCT: 34 % — ABNORMAL LOW (ref 36.0–46.0)
Hemoglobin: 11.6 g/dL — ABNORMAL LOW (ref 12.0–15.0)
O2 Saturation: 97 %
Potassium: 3.6 mmol/L (ref 3.5–5.1)
Sodium: 141 mmol/L (ref 135–145)
TCO2: 38 mmol/L — ABNORMAL HIGH (ref 22–32)
pCO2 arterial: 63.9 mmHg — ABNORMAL HIGH (ref 32–48)
pH, Arterial: 7.357 (ref 7.35–7.45)
pO2, Arterial: 95 mmHg (ref 83–108)

## 2021-09-15 LAB — TROPONIN I (HIGH SENSITIVITY): Troponin I (High Sensitivity): 5 ng/L (ref <18)

## 2021-09-15 LAB — GROUP A STREP BY PCR: Group A Strep by PCR: NOT DETECTED

## 2021-09-15 MED ORDER — ONDANSETRON HCL 4 MG/2ML IJ SOLN
4.0000 mg | Freq: Once | INTRAMUSCULAR | Status: AC
  Administered 2021-09-15: 4 mg via INTRAVENOUS
  Filled 2021-09-15: qty 2

## 2021-09-15 MED ORDER — IOHEXOL 350 MG/ML SOLN
100.0000 mL | Freq: Once | INTRAVENOUS | Status: AC | PRN
  Administered 2021-09-15: 100 mL via INTRAVENOUS

## 2021-09-15 MED ORDER — ALUM & MAG HYDROXIDE-SIMETH 200-200-20 MG/5ML PO SUSP
30.0000 mL | Freq: Once | ORAL | Status: AC
  Administered 2021-09-15: 30 mL via ORAL
  Filled 2021-09-15: qty 30

## 2021-09-15 MED ORDER — IPRATROPIUM-ALBUTEROL 0.5-2.5 (3) MG/3ML IN SOLN
3.0000 mL | Freq: Once | RESPIRATORY_TRACT | Status: AC
  Administered 2021-09-15: 3 mL via RESPIRATORY_TRACT
  Filled 2021-09-15: qty 3

## 2021-09-15 MED ORDER — LIDOCAINE VISCOUS HCL 2 % MT SOLN
15.0000 mL | Freq: Once | OROMUCOSAL | Status: AC
  Administered 2021-09-15: 15 mL via ORAL
  Filled 2021-09-15: qty 15

## 2021-09-15 MED ORDER — NALOXONE HCL 2 MG/2ML IJ SOSY
1.0000 mg | PREFILLED_SYRINGE | Freq: Once | INTRAMUSCULAR | Status: AC
  Administered 2021-09-15: 1 mg via INTRAVENOUS
  Filled 2021-09-15: qty 2

## 2021-09-15 MED ORDER — ACETAMINOPHEN 325 MG PO TABS
650.0000 mg | ORAL_TABLET | Freq: Once | ORAL | Status: DC
  Filled 2021-09-15: qty 2

## 2021-09-15 MED ORDER — FUROSEMIDE 10 MG/ML IJ SOLN
40.0000 mg | Freq: Once | INTRAMUSCULAR | Status: AC
  Administered 2021-09-15: 40 mg via INTRAVENOUS
  Filled 2021-09-15: qty 4

## 2021-09-15 NOTE — ED Provider Notes (Signed)
[facility] EMERGENCY DEPARTMENT Provider Note   CSN: [id number] Arrival date & time: 09/14/21  2058     History  Chief Complaint  Patient presents with   Cough    Michele Owens is a 61 y.o. female.  Patient with a history of obesity, diastolic CHF, COPD on oxygen at night, diabetes, previous DVT here with productive cough ongoing for the past 5 days.  States this started after she was visiting the [place] on Saturday.  Since then she has had a cough productive of brown mucus with some blood streaks.  Feels like she is more short of breath than usual but has not had to increase her oxygen at home.  Was recently treated for pneumonia about a week ago and finished antibiotics.  Does have some right-sided chest pain with coughing.  Denies fevers, chills but does feel sore and achy all over.  No vomiting or diarrhea.  Minimal swelling in her legs without asymmetry.  She denies any vomiting.  She denies any chest pain unless she is coughing.  Denies any previous cardiac history and has no stents in her heart.  No current blood thinner use.  She states when she coughs when she talks she sees a swelling to the right base of her neck which concerns her.  None is visible on exam.  The history is provided by the patient.  Cough Associated symptoms: chest pain, chills, myalgias, rhinorrhea and shortness of breath   Associated symptoms: no fever, no headaches and no rash        Home Medications Prior to Admission medications   Medication Sig Start Date End Date Taking? Authorizing Provider  acetaminophen (TYLENOL) 500 MG tablet Take 1,000 mg by mouth every 6 (six) hours as needed for moderate pain or headache.    [provider]  albuterol (PROVENTIL HFA;VENTOLIN HFA) 108 (90 Base) MCG/ACT inhaler Inhale 1-2 puffs into the lungs every 6 (six) hours as needed for wheezing or shortness of breath. 04/23/18   Long, [name], MD  allopurinol (ZYLOPRIM) 100 MG tablet Take  100 mg by mouth daily.    [provider]  aspirin EC 81 MG tablet Take 81 mg by mouth daily. Swallow whole.    [provider]  benzonatate (TESSALON) 200 MG capsule Take 1 capsule (200 mg total) by mouth in the morning, at noon, and at bedtime. 7 DS Patient taking differently: Take 200 mg by mouth 3 (three) times daily as needed for cough. 04/13/20   Regalado, Belkys A, MD  blood glucose meter kit and supplies KIT Dispense based on patient and insurance preference. Use up to four times daily as directed. 12/25/20   Little Ishikawa, MD  busPIRone (BUSPAR) 5 MG tablet Take 5 mg by mouth 3 (three) times daily as needed (anxiety).    [provider]  Butalbital-APAP-Caffeine 50-300-40 MG CAPS Take 1 capsule by mouth 2 (two) times daily as needed for headache.    [provider]  calcitRIOL (ROCALTROL) 0.5 MCG capsule Take 1 capsule (0.5 mcg total) by mouth daily. 09/26/16   Hongalgi, [name], MD  carvedilol (COREG) 6.25 MG tablet Take 1 tablet (6.25 mg total) by mouth 2 (two) times daily with a meal. 01/23/18   Bensimhon, [name], MD  cefdinir (OMNICEF) 300 MG capsule Take 1 capsule (300 mg total) by mouth 2 (two) times daily. 08/30/21   Quintella Reichert, MD  clonazePAM (KLONOPIN) 2 MG tablet Take 1 tablet (2 mg total) by  mouth 3 (three) times daily. for anxiety Patient taking differently: Take 2 mg by mouth 3 (three) times daily. 06/23/19   [name], MD  cyclobenzaprine (FLEXERIL) 10 MG tablet Take 10 mg by mouth 3 (three) times daily. 11/19/19   [provider]  diclofenac sodium (VOLTAREN) 1 % GEL Apply 4 g topically 4 (four) times daily as needed (for pain). 04/27/21   Varney Biles, MD  dicyclomine (BENTYL) 20 MG tablet Take 1 tablet (20 mg total) by mouth 2 (two) times daily as needed for spasms (abdominal cramping). 06/10/21   Mesner, Corene Cornea, MD  doxycycline (VIBRAMYCIN) 100 MG capsule Take 1 capsule (100 mg total) by mouth 2 (two) times daily. One  po bid x 7 days Patient not taking: Reported on 06/27/2021 03/16/21   Deno Etienne, DO  doxycycline (VIBRAMYCIN) 100 MG capsule Take 1 capsule (100 mg total) by mouth 2 (two) times daily. 08/30/21   Quintella Reichert, MD  EPINEPHrine 0.3 mg/0.3 mL IJ SOAJ injection Inject 0.3 mg into the muscle once as needed for anaphylaxis.    [provider]  fenofibrate (TRICOR) 145 MG tablet Take 145 mg by mouth daily. 06/07/21   [provider]  FEROSUL 325 (65 Fe) MG tablet Take 325 mg by mouth daily. 03/22/17   [provider]  glucose blood test strip Use as instructed to check blood sugar up to 4 times daily Patient taking differently: 1 each by Other route See admin instructions. Use as instructed to check blood sugar up to 4 times daily 08/06/17   [name], DO  guaiFENesin (DIABETIC SILTUSSIN DAS-NA PO) Take 5 mLs by mouth at bedtime as needed (cough).    [provider]  ipratropium-albuterol (DUONEB) 0.5-2.5 (3) MG/3ML SOLN Take 3 mLs by nebulization 3 (three) times daily. 04/05/17   [provider]  levothyroxine (SYNTHROID, LEVOTHROID) 112 MCG tablet Take 224 mcg by mouth daily before breakfast. 07/11/17   [provider]  lisinopril (PRINIVIL,ZESTRIL) 5 MG tablet Take 1 tablet (5 mg total) by mouth daily. Please call for office visit [phone] Patient taking differently: Take 5 mg by mouth daily. 08/15/17   [name], MD  loperamide (IMODIUM) 2 MG capsule Take 2 mg by mouth 4 (four) times daily as needed. 05/09/21   [provider]  loratadine (CLARITIN) 10 MG tablet Take 10 mg by mouth daily.    [provider]  metFORMIN (GLUCOPHAGE) 500 MG tablet Take 1 tablet (500 mg total) by mouth 2 (two) times daily with a meal. 12/11/14   [name], MD  nitroGLYCERIN (NITROSTAT) 0.4 MG SL tablet Place 1 tablet (0.4 mg total) under the tongue every 5 (five) minutes as needed for chest pain. 06/19/18   [name], MD   omeprazole (PRILOSEC) 20 MG capsule Take 20 mg by mouth 2 (two) times daily. 05/19/21   [provider]  ondansetron (ZOFRAN-ODT) 4 MG disintegrating tablet 109m ODT q4 hours prn nausea/vomit Patient taking differently: Take 4 mg by mouth every 8 (eight) hours as needed for vomiting or nausea. 03/09/21   YDrenda Freeze MD  oxyCODONE-acetaminophen (PERCOCET) 10-325 MG tablet Take 1 tablet by mouth every 6 (six) hours as needed for pain. 12/03/19   [provider]  OXYGEN Inhale 4 L into the lungs continuous.    [provider]  phentermine (ADIPEX-P) 37.5 MG tablet Take 37.5 mg by mouth daily. 05/25/21   [provider]  potassium chloride SA (K-DUR,KLOR-CON) 20 MEQ tablet Take  1 tablet (20 mEq total) by mouth 2 (two) times daily. 07/24/18   [name], MD  rosuvastatin (CRESTOR) 10 MG tablet Take 10 mg by mouth every evening. 11/19/19   [provider]  senna-docusate (SENOKOT-S) 8.6-50 MG tablet Take 1 tablet by mouth daily.    [provider]  torsemide (DEMADEX) 20 MG tablet TAKE 2 TABLETS (40 MG TOTAL) BY MOUTH 2 (TWO) TIMES DAILY. 07/05/21   [name], MD  traZODone (DESYREL) 50 MG tablet Take 100 mg by mouth at bedtime. 11/19/19   [provider]  triamcinolone cream (KENALOG) 0.1 % Apply 1 application. topically 2 (two) times daily as needed (irrritation). 09/10/20   [provider]  umeclidinium-vilanterol (ANORO ELLIPTA) 62.5-25 MCG/INH AEPB Inhale 1 puff into the lungs daily.    [provider]      Allergies    Bee venom, Fioricet [butalbital-apap-caffeine], Ibuprofen, Lamisil [terbinafine], and Nsaids    Review of Systems   Review of Systems  Constitutional:  Positive for chills and fatigue. Negative for activity change, appetite change and fever.  HENT:  Positive for congestion and rhinorrhea.   Respiratory:  Positive for cough, chest tightness and shortness of breath.   Cardiovascular:   Positive for chest pain.  Gastrointestinal:  Negative for abdominal pain, nausea and vomiting.  Genitourinary:  Negative for dysuria and hematuria.  Musculoskeletal:  Positive for arthralgias and myalgias.  Skin:  Negative for rash.  Neurological:  Positive for weakness. Negative for headaches.   all other systems are negative except as noted in the HPI and PMH.    Physical Exam Updated Vital Signs BP 114/87 (BP Location: Right Arm)   Pulse 90   Temp 98.5 F (36.9 C) (Oral)   Resp 17   Ht '5\' 5"'  (1.651 m)   Wt 136.1 kg   SpO2 98%   BMI 49.92 kg/m  Physical Exam Vitals and nursing note reviewed.  Constitutional:      General: She is not in acute distress.    Appearance: She is well-developed. She is obese.     Comments: Dyspneic with conversation  HENT:     Head: Normocephalic and atraumatic.     Mouth/Throat:     Pharynx: No oropharyngeal exudate.  Eyes:     Conjunctiva/sclera: Conjunctivae normal.     Pupils: Pupils are equal, round, and reactive to light.  Neck:     Comments: No meningismus.  No swelling appreciated on her neck in her area of concern. Cardiovascular:     Rate and Rhythm: Normal rate and regular rhythm.     Heart sounds: Normal heart sounds. No murmur heard. Pulmonary:     Effort: Pulmonary effort is normal. No respiratory distress.     Breath sounds: Rhonchi present.     Comments: Rhonchi at bases, no wheezing Chest:     Chest wall: Tenderness present.  Abdominal:     Palpations: Abdomen is soft.     Tenderness: There is no abdominal tenderness. There is no guarding or rebound.  Musculoskeletal:        General: No tenderness. Normal range of motion.     Cervical back: Normal range of motion and neck supple.     Right lower leg: Edema present.     Left lower leg: Edema present.     Comments: Trace pedal edema bilateral  Skin:    General: Skin is warm.  Neurological:     Mental Status: She is alert and oriented to  person, place, and time.      Cranial Nerves: No cranial nerve deficit.     Motor: No abnormal muscle tone.     Coordination: Coordination normal.     Comments:  5/5 strength throughout. CN 2-12 intact.Equal grip strength.   Psychiatric:        Behavior: Behavior normal.     ED Results / Procedures / Treatments   Labs (all labs ordered are listed, but only abnormal results are displayed) Labs Reviewed  CBC WITH DIFFERENTIAL/PLATELET - Abnormal; Notable for the following components:      Result Value   WBC 16.7 (*)    RBC 3.72 (*)    Hemoglobin 10.9 (*)    HCT 35.5 (*)    Neutro Abs 12.1 (*)    Eosinophils Absolute 1.1 (*)    Abs Immature Granulocytes 0.24 (*)    All other components within normal limits  COMPREHENSIVE METABOLIC PANEL - Abnormal; Notable for the following components:   CO2 33 (*)    Creatinine, Ser 1.04 (*)    Calcium 8.6 (*)    Albumin 3.4 (*)    All other components within normal limits  I-STAT ARTERIAL BLOOD GAS, ED - Abnormal; Notable for the following components:   pCO2 arterial 63.9 (*)    Bicarbonate 35.9 (*)    TCO2 38 (*)    Acid-Base Excess 8.0 (*)    HCT 34.0 (*)    Hemoglobin 11.6 (*)    All other components within normal limits  GROUP A STREP BY PCR  LIPASE, BLOOD  BRAIN NATRIURETIC PEPTIDE  URINALYSIS, ROUTINE W REFLEX MICROSCOPIC  TROPONIN I (HIGH SENSITIVITY)  TROPONIN I (HIGH SENSITIVITY)    EKG EKG Interpretation  Date/Time:  Wednesday September 14 2021 21:07:57 EDT Ventricular Rate:  86 PR Interval:  168 QRS Duration: 68 QT Interval:  344 QTC Calculation: 411 R Axis:   68 Text Interpretation: Normal sinus rhythm Low voltage QRS Cannot rule out Anterior infarct , age undetermined Abnormal ECG When compared with ECG of 29-Aug-2021 22:29, PREVIOUS ECG IS PRESENT No significant change was found Confirmed by Ezequiel Essex [phone]) on 09/15/2021 7:12:58 AM  Radiology CT Soft Tissue Neck W Contrast  Result Date: 09/15/2021 CLINICAL DATA:  Palate weakness (CN 9)  EXAM: CT NECK WITH CONTRAST TECHNIQUE: Multidetector CT imaging of the neck was performed using the standard protocol following the bolus administration of intravenous contrast. RADIATION DOSE REDUCTION: This exam was performed according to the departmental dose-optimization program which includes automated exposure control, adjustment of the mA and/or kV according to patient size and/or use of iterative reconstruction technique. CONTRAST:  170m OMNIPAQUE IOHEXOL 350 MG/ML SOLN COMPARISON:  None Available. FINDINGS: Pharynx and larynx: Unremarkable.  No mass or swelling. Salivary glands: Parotid and submandibular glands are unremarkable. Thyroid: Thyroidectomy. Lymph nodes: No enlarged or abnormal density nodes. Vascular: Major neck vessels are patent. Minimal calcified plaque at the right common carotid bifurcation. Partially retropharyngeal course of the carotids. Limited intracranial: No abnormal enhancement. Visualized orbits: Deformity of the left medial orbital wall with herniation of orbital fat may reflect sequelae of prior trauma. There is slight extension of the medial rectus into the defect. Mastoids and visualized paranasal sinuses: Minor mucosal thickening. Mastoid air cells are clear. Skeleton: No acute or significant osseous abnormality. Upper chest: Dictated separately. IMPRESSION: No neck mass or adenopathy. Electronically Signed   By: PMacy MisM.D.   On: 09/15/2021 09:22   CT Angio Chest PE W and/or Wo Contrast  Result Date: 09/15/2021 CLINICAL DATA:  Weakness EXAM: CT ANGIOGRAPHY CHEST WITH CONTRAST TECHNIQUE: Multidetector CT imaging of the chest was performed using the standard protocol during bolus administration of intravenous contrast. Multiplanar CT image reconstructions and MIPs were obtained to evaluate the vascular anatomy. RADIATION DOSE REDUCTION: This exam was performed according to the departmental dose-optimization program which includes automated exposure control,  adjustment of the mA and/or kV according to patient size and/or use of iterative reconstruction technique. CONTRAST:  181m OMNIPAQUE IOHEXOL 350 MG/ML SOLN COMPARISON:  06/27/2021 FINDINGS: Cardiovascular: Satisfactory opacification of the pulmonary arteries to the segmental level. No evidence of pulmonary embolism. Stable cardiomegaly. Coronary artery atherosclerosis. Thoracic aortic atherosclerosis. No pericardial effusion. Dilated main pulmonary artery as can be seen with pulmonary arterial hypertension. Mediastinum/Nodes: No enlarged mediastinal, hilar, or axillary lymph nodes. Thyroid gland, trachea, and esophagus demonstrate no significant findings. Lungs/Pleura: Mild patchy areas of ground-glass opacities and mild interstitial thickening at the lung bases. Bibasilar atelectasis. No pleural effusion or pneumothorax. No focal consolidation. Upper Abdomen: No acute abnormality. Musculoskeletal: No chest wall abnormality. No acute or significant osseous findings. Anterior bridging osteophytes of the mid and lower thoracic spine as can be seen with diffuse idiopathic skeletal hyperostosis. Review of the MIP images confirms the above findings. IMPRESSION: 1. No pulmonary embolism. 2. Cardiomegaly with mild patchy areas of ground-glass opacities and mild interstitial thickening at the lung bases, likely due to mild CHF. 3. Dilated main pulmonary artery as can be seen with pulmonary arterial hypertension. 4. Aortic Atherosclerosis (ICD10-I70.0). Electronically Signed   By: HKathreen DevoidM.D.   On: 09/15/2021 09:20   DG Chest 1 View  Result Date: 09/14/2021 CLINICAL DATA:  Cough. EXAM: CHEST  1 VIEW COMPARISON:  08/29/2021. FINDINGS: Heart is enlarged the mediastinal contour stable. The pulmonary vasculature is distended. Strandy opacities are noted in the mid to lower lung fields bilaterally. No definite effusion or pneumothorax. No acute osseous abnormality. IMPRESSION: 1. Cardiomegaly with distended pulmonary  vasculature. 2. Strandy opacities at the lung bases, not significantly changed from the prior exam. Electronically Signed   By: LBrett FairyM.D.   On: 09/14/2021 21:50    Procedures Procedures    Medications Ordered in ED Medications  furosemide (LASIX) injection 40 mg (has no administration in time range)  ipratropium-albuterol (DUONEB) 0.5-2.5 (3) MG/3ML nebulizer solution 3 mL (has no administration in time range)  alum & mag hydroxide-simeth (MAALOX/MYLANTA) 200-200-20 MG/5ML suspension 30 mL (has no administration in time range)    And  lidocaine (XYLOCAINE) 2 % viscous mouth solution 15 mL (has no administration in time range)    ED Course/ Medical Decision Making/ A&P                           Medical Decision Making Amount and/or Complexity of Data Reviewed Independent Historian: EMS Labs:  Decision-making details documented in ED Course. Radiology: ordered and independent interpretation performed. Decision-making details documented in ED Course. ECG/medicine tests: ordered and independent interpretation performed. Decision-making details documented in ED Course.  Risk OTC drugs. Prescription drug management.  5 days of cough, SOB, chest pain with coughing. EKG without acute ischemia.   No hypoxia or increased work of breathing at rest but has dyspnea with conversation.  Her chest x-ray shows vascular congestion similar to previous, slightly improved  Labs show stable leukocytosis.  Troponin negative x2 with chest pain reproducible with palpation.  Low suspicion for ACS.  Work-up consistent with mild  CHF.  Patient given IV dose of Lasix.  She is able to ambulate without desaturation on her home oxygen.  Imaging is negative for pulmonary embolism.  Results reviewed and interpreted by me.  Does have mild edema on CT scan but no pneumonia.  CT neck was obtained given her concern for possible swelling to the right side of her neck.  This is negative for any kind of abscess  or mass.  Patient was able to ambulate without desaturation.  Suspect CHF as a cause of her dyspnea and cough.  She is given IV Lasix in the ED with good results.  Mental status did decrease during ED stay and ABG was checked which shows a mild respiratory acidosis which is compensated.  ABG appears stable compared to March 2023.  Patient not given any sedating medications in the ED.  Trial Narcan dose was given as she does take opiates at home. patient given dose of Narcan with good effect and she is now having some dry heaving.  She is prescribed Percocet and received 108 pills May 24.  She is also prescribed clonazepam.  Mental status did improve after Narcan.  Patient able to ambulate without difficulty.  Does take Percocet on a regular basis but denies any intentional overdose. No evidence of significant CO2 retention.  Suspect her cough and shortness of breath due to mild CHF exacerbation.  She was given IV Lasix throughout her ED stay and is able to ambulate without desaturation.  Continue her home torsemide and follow-up with her PCP and cardiologist. We will have her increase her torsemide to 60 mg twice daily for 2 days then go back to 40 mg twice daily as scheduled.  Return precautions discussed       Final Clinical Impression(s) / ED Diagnoses Final diagnoses:  Acute on chronic diastolic congestive heart failure [facility])    Rx / DC Orders ED Discharge Orders     None         Amirr Achord, Annie Main, MD 09/15/21 1432

## 2021-09-15 NOTE — Discharge Instructions (Addendum)
Increase your torsemide to 60 mg twice daily for 2 days then go back to 40 mg twice daily as usual.  Follow-up with Dr. Jonelle Sidle and your cardiologist this week for recheck.  Return to the ED with new or worsening symptoms.

## 2021-09-15 NOTE — ED Notes (Signed)
Patient continues to be lethargic at this time. Dr. Wyvonnia Dusky at bedside evaluating.

## 2021-09-15 NOTE — ED Notes (Signed)
Pulse oximetry while ambulating to restroom 94%

## 2021-09-15 NOTE — ED Notes (Signed)
Pt ambulated down hallway with cane with little assist.  Fluid challenged completed.

## 2021-09-15 NOTE — ED Notes (Signed)
Patient now with an episode of emesis after narcan given. Patient now alert at this time.

## 2021-09-27 DIAGNOSIS — M542 Cervicalgia: Secondary | ICD-10-CM | POA: Diagnosis not present

## 2021-10-04 ENCOUNTER — Other Ambulatory Visit: Payer: Self-pay | Admitting: Obstetrics and Gynecology

## 2021-10-05 DIAGNOSIS — Z6841 Body Mass Index (BMI) 40.0 and over, adult: Secondary | ICD-10-CM | POA: Diagnosis not present

## 2021-10-05 DIAGNOSIS — Z01419 Encounter for gynecological examination (general) (routine) without abnormal findings: Secondary | ICD-10-CM | POA: Diagnosis not present

## 2021-10-05 DIAGNOSIS — Z1231 Encounter for screening mammogram for malignant neoplasm of breast: Secondary | ICD-10-CM | POA: Diagnosis not present

## 2021-10-05 DIAGNOSIS — Z113 Encounter for screening for infections with a predominantly sexual mode of transmission: Secondary | ICD-10-CM | POA: Diagnosis not present

## 2021-10-14 DIAGNOSIS — R2681 Unsteadiness on feet: Secondary | ICD-10-CM | POA: Diagnosis not present

## 2021-10-19 DIAGNOSIS — I110 Hypertensive heart disease with heart failure: Secondary | ICD-10-CM | POA: Diagnosis not present

## 2021-10-20 DIAGNOSIS — I110 Hypertensive heart disease with heart failure: Secondary | ICD-10-CM | POA: Diagnosis not present

## 2021-10-21 DIAGNOSIS — I110 Hypertensive heart disease with heart failure: Secondary | ICD-10-CM | POA: Diagnosis not present

## 2021-10-25 DIAGNOSIS — I110 Hypertensive heart disease with heart failure: Secondary | ICD-10-CM | POA: Diagnosis not present

## 2021-10-26 DIAGNOSIS — I110 Hypertensive heart disease with heart failure: Secondary | ICD-10-CM | POA: Diagnosis not present

## 2021-10-27 DIAGNOSIS — I110 Hypertensive heart disease with heart failure: Secondary | ICD-10-CM | POA: Diagnosis not present

## 2021-10-28 DIAGNOSIS — I110 Hypertensive heart disease with heart failure: Secondary | ICD-10-CM | POA: Diagnosis not present

## 2021-10-31 DIAGNOSIS — I110 Hypertensive heart disease with heart failure: Secondary | ICD-10-CM | POA: Diagnosis not present

## 2021-11-01 DIAGNOSIS — I110 Hypertensive heart disease with heart failure: Secondary | ICD-10-CM | POA: Diagnosis not present

## 2021-11-03 DIAGNOSIS — I110 Hypertensive heart disease with heart failure: Secondary | ICD-10-CM | POA: Diagnosis not present

## 2021-11-04 DIAGNOSIS — I110 Hypertensive heart disease with heart failure: Secondary | ICD-10-CM | POA: Diagnosis not present

## 2021-11-08 DIAGNOSIS — I110 Hypertensive heart disease with heart failure: Secondary | ICD-10-CM | POA: Diagnosis not present

## 2021-11-09 DIAGNOSIS — I110 Hypertensive heart disease with heart failure: Secondary | ICD-10-CM | POA: Diagnosis not present

## 2021-11-10 DIAGNOSIS — I110 Hypertensive heart disease with heart failure: Secondary | ICD-10-CM | POA: Diagnosis not present

## 2021-11-11 ENCOUNTER — Encounter (HOSPITAL_COMMUNITY): Payer: Self-pay

## 2021-11-11 ENCOUNTER — Emergency Department (HOSPITAL_COMMUNITY)
Admission: EM | Admit: 2021-11-11 | Discharge: 2021-11-11 | Disposition: A | Discharge status: Home / Self Care | Payer: Medicaid Other | Attending: Emergency Medicine | Admitting: Emergency Medicine

## 2021-11-11 ENCOUNTER — Other Ambulatory Visit: Payer: Self-pay

## 2021-11-11 ENCOUNTER — Emergency Department (HOSPITAL_COMMUNITY): Payer: Medicaid Other

## 2021-11-11 DIAGNOSIS — I10 Essential (primary) hypertension: Secondary | ICD-10-CM | POA: Diagnosis not present

## 2021-11-11 DIAGNOSIS — I509 Heart failure, unspecified: Secondary | ICD-10-CM | POA: Insufficient documentation

## 2021-11-11 DIAGNOSIS — J449 Chronic obstructive pulmonary disease, unspecified: Secondary | ICD-10-CM | POA: Diagnosis not present

## 2021-11-11 DIAGNOSIS — Z7984 Long term (current) use of oral hypoglycemic drugs: Secondary | ICD-10-CM | POA: Diagnosis not present

## 2021-11-11 DIAGNOSIS — R0602 Shortness of breath: Secondary | ICD-10-CM | POA: Diagnosis not present

## 2021-11-11 DIAGNOSIS — M7989 Other specified soft tissue disorders: Secondary | ICD-10-CM | POA: Diagnosis not present

## 2021-11-11 DIAGNOSIS — Z7982 Long term (current) use of aspirin: Secondary | ICD-10-CM | POA: Diagnosis not present

## 2021-11-11 DIAGNOSIS — E119 Type 2 diabetes mellitus without complications: Secondary | ICD-10-CM | POA: Insufficient documentation

## 2021-11-11 DIAGNOSIS — R2240 Localized swelling, mass and lump, unspecified lower limb: Secondary | ICD-10-CM | POA: Diagnosis not present

## 2021-11-11 DIAGNOSIS — Z79899 Other long term (current) drug therapy: Secondary | ICD-10-CM | POA: Diagnosis not present

## 2021-11-11 DIAGNOSIS — Z743 Need for continuous supervision: Secondary | ICD-10-CM | POA: Diagnosis not present

## 2021-11-11 DIAGNOSIS — R609 Edema, unspecified: Secondary | ICD-10-CM | POA: Diagnosis not present

## 2021-11-11 DIAGNOSIS — I110 Hypertensive heart disease with heart failure: Secondary | ICD-10-CM | POA: Diagnosis not present

## 2021-11-11 LAB — CBC WITH DIFFERENTIAL/PLATELET
Abs Immature Granulocytes: 0.05 10*3/uL (ref 0.00–0.07)
Basophils Absolute: 0.1 10*3/uL (ref 0.0–0.1)
Basophils Relative: 1 %
Eosinophils Absolute: 0.4 10*3/uL (ref 0.0–0.5)
Eosinophils Relative: 4 %
HCT: 30.3 % — ABNORMAL LOW (ref 36.0–46.0)
Hemoglobin: 9.4 g/dL — ABNORMAL LOW (ref 12.0–15.0)
Immature Granulocytes: 1 %
Lymphocytes Relative: 20 %
Lymphs Abs: 2.1 10*3/uL (ref 0.7–4.0)
MCH: 29.7 pg (ref 26.0–34.0)
MCHC: 31 g/dL (ref 30.0–36.0)
MCV: 95.9 fL (ref 80.0–100.0)
Monocytes Absolute: 0.7 10*3/uL (ref 0.1–1.0)
Monocytes Relative: 7 %
Neutro Abs: 7.3 10*3/uL (ref 1.7–7.7)
Neutrophils Relative %: 67 %
Platelets: 213 10*3/uL (ref 150–400)
RBC: 3.16 MIL/uL — ABNORMAL LOW (ref 3.87–5.11)
RDW: 14 % (ref 11.5–15.5)
WBC: 10.6 10*3/uL — ABNORMAL HIGH (ref 4.0–10.5)
nRBC: 0 % (ref 0.0–0.2)

## 2021-11-11 LAB — BASIC METABOLIC PANEL
Anion gap: 10 (ref 5–15)
BUN: 19 mg/dL (ref 8–23)
CO2: 33 mmol/L — ABNORMAL HIGH (ref 22–32)
Calcium: 8.7 mg/dL — ABNORMAL LOW (ref 8.9–10.3)
Chloride: 99 mmol/L (ref 98–111)
Creatinine, Ser: 1.11 mg/dL — ABNORMAL HIGH (ref 0.44–1.00)
GFR, Estimated: 57 mL/min — ABNORMAL LOW (ref >60)
Glucose, Bld: 99 mg/dL (ref 70–99)
Potassium: 4.1 mmol/L (ref 3.5–5.1)
Sodium: 142 mmol/L (ref 135–145)

## 2021-11-11 LAB — TROPONIN I (HIGH SENSITIVITY)
Troponin I (High Sensitivity): 6 ng/L (ref <18)
Troponin I (High Sensitivity): 5 ng/L (ref <18)

## 2021-11-11 LAB — BRAIN NATRIURETIC PEPTIDE: B Natriuretic Peptide: 21.9 pg/mL (ref 0.0–100.0)

## 2021-11-11 MED ORDER — FUROSEMIDE 10 MG/ML IJ SOLN
40.0000 mg | Freq: Once | INTRAMUSCULAR | Status: AC
  Administered 2021-11-11: 40 mg via INTRAMUSCULAR
  Filled 2021-11-11: qty 4

## 2021-11-11 NOTE — ED Notes (Signed)
Pt assisted in changing back into her clothing, assisted into a wheelchair and assisted to the bathroom in the waiting room. Educated on how to ask for help. Pt also educated on her medication changes and all questions were answered. Pt expressed understanding

## 2021-11-11 NOTE — Discharge Instructions (Addendum)
Increase your torsemide to 60 mg in the morning and 40 mg in the evening for the next 4 days.  Follow-up with your primary care doctor.

## 2021-11-11 NOTE — ED Provider Triage Note (Signed)
Emergency Medicine Provider Triage Evaluation Note  Michele Owens , a 61 y.o. female  was evaluated in triage.  Pt complains of lower extremity swelling.  Patient states that she is a heart failure patient and has chronic bronchitis.  She states that she has had progressive lower extremity swelling and pain secondary to the swelling, particularly in her left leg.  She is also endorsing increased shortness of breath without cough or fever.  She wears 3 L all the time at home and has not had to increase her oxygen requirements..  Denies chest pain.  Review of Systems  Positive:  Negative:   Physical Exam  BP 104/61 (BP Location: Right Arm)   Pulse 86   Temp 98.4 F (36.9 C) (Oral)   Resp 17   Ht '5\' 5"'$  (1.651 m)   Wt 136.1 kg   SpO2 95%   BMI 49.92 kg/m  Gen:   Awake, no distress   Resp:  Normal effort, on 3 L nasal cannula MSK:   Moves extremities without difficulty  Other:  Bilateral lower extremity pitting edema  Medical Decision Making  Medically screening exam initiated at 2:33 PM.  Appropriate orders placed.  Pebble Botkin was informed that the remainder of the evaluation will be completed by another provider, this initial triage assessment does not replace that evaluation, and the importance of remaining in the ED until their evaluation is complete.     Mickie Hillier, PA-C 11/11/21 1434

## 2021-11-11 NOTE — ED Provider Notes (Signed)
[facility] EMERGENCY DEPARTMENT Provider Note   CSN: [id number] Arrival date & time: 11/11/21  1354     History  Chief Complaint  Patient presents with   Leg Pain    Michele Owens is a 61 y.o. female.  Here with swelling in her legs.  She feels like she needs to increase her diuretic.  She has some chronic left knee pain as well but not so bad today.  Patient denies any chest pain or shortness of breath or abdominal pain.  Nothing makes it worse or better.  She is chronically on oxygen.  History of diabetes, CHF, COPD.  Patient takes torsemide twice daily 40 mg in the morning and 40 mg in the evening.  The history is provided by the patient.       Home Medications Prior to Admission medications   Medication Sig Start Date End Date Taking? Authorizing Provider  acetaminophen (TYLENOL) 500 MG tablet Take 1,000 mg by mouth every 6 (six) hours as needed for moderate pain or headache.    [provider]  albuterol (PROVENTIL HFA;VENTOLIN HFA) 108 (90 Base) MCG/ACT inhaler Inhale 1-2 puffs into the lungs every 6 (six) hours as needed for wheezing or shortness of breath. 04/23/18   Long, [name], MD  allopurinol (ZYLOPRIM) 100 MG tablet Take 100 mg by mouth daily.    [provider]  aspirin EC 81 MG tablet Take 81 mg by mouth daily. Swallow whole.    [provider]  benzonatate (TESSALON) 200 MG capsule Take 1 capsule (200 mg total) by mouth in the morning, at Owens, and at bedtime. 7 DS Patient taking differently: Take 200 mg by mouth 3 (three) times daily as needed for cough. 04/13/20   Regalado, Belkys A, MD  blood glucose meter kit and supplies KIT Dispense based on patient and insurance preference. Use up to four times daily as directed. 12/25/20   Little Ishikawa, MD  busPIRone (BUSPAR) 5 MG tablet Take 5 mg by mouth 3 (three) times daily as needed (anxiety).    [provider]  Butalbital-APAP-Caffeine 50-300-40 MG CAPS Take  1 capsule by mouth 2 (two) times daily as needed for headache.    [provider]  calcitRIOL (ROCALTROL) 0.5 MCG capsule Take 1 capsule (0.5 mcg total) by mouth daily. 09/26/16   Hongalgi, Michele Dickinson, MD  carvedilol (COREG) 6.25 MG tablet Take 1 tablet (6.25 mg total) by mouth 2 (two) times daily with a meal. 01/23/18   Bensimhon, [name], MD  cefdinir (OMNICEF) 300 MG capsule Take 1 capsule (300 mg total) by mouth 2 (two) times daily. 08/30/21   Quintella Reichert, MD  clonazePAM (KLONOPIN) 2 MG tablet Take 1 tablet (2 mg total) by mouth 3 (three) times daily. for anxiety Patient taking differently: Take 2 mg by mouth 3 (three) times daily. 06/23/19   [name], MD  cyclobenzaprine (FLEXERIL) 10 MG tablet Take 10 mg by mouth 3 (three) times daily. 11/19/19   [provider]  diclofenac sodium (VOLTAREN) 1 % GEL Apply 4 g topically 4 (four) times daily as needed (for pain). 04/27/21   Varney Biles, MD  dicyclomine (BENTYL) 20 MG tablet Take 1 tablet (20 mg total) by mouth 2 (two) times daily as needed for spasms (abdominal cramping). 06/10/21   Mesner, Corene Cornea, MD  doxycycline (VIBRAMYCIN) 100 MG capsule Take 1 capsule (100 mg total) by mouth 2 (two) times daily. One po bid x 7 days Patient not taking: Reported  on 06/27/2021 03/16/21   Deno Etienne, DO  doxycycline (VIBRAMYCIN) 100 MG capsule Take 1 capsule (100 mg total) by mouth 2 (two) times daily. 08/30/21   Quintella Reichert, MD  EPINEPHrine 0.3 mg/0.3 mL IJ SOAJ injection Inject 0.3 mg into the muscle once as needed for anaphylaxis.    [provider]  fenofibrate (TRICOR) 145 MG tablet Take 145 mg by mouth daily. 06/07/21   [provider]  FEROSUL 325 (65 Fe) MG tablet Take 325 mg by mouth daily. 03/22/17   [provider]  glucose blood test strip Use as instructed to check blood sugar up to 4 times daily Patient taking differently: 1 each by Other route See admin instructions. Use as instructed to check  blood sugar up to 4 times daily 08/06/17   [name], DO  guaiFENesin (DIABETIC SILTUSSIN DAS-NA PO) Take 5 mLs by mouth at bedtime as needed (cough).    [provider]  ipratropium-albuterol (DUONEB) 0.5-2.5 (3) MG/3ML SOLN Take 3 mLs by nebulization 3 (three) times daily. 04/05/17   [provider]  levothyroxine (SYNTHROID, LEVOTHROID) 112 MCG tablet Take 224 mcg by mouth daily before breakfast. 07/11/17   [provider]  lisinopril (PRINIVIL,ZESTRIL) 5 MG tablet Take 1 tablet (5 mg total) by mouth daily. Please call for office visit [phone] Patient taking differently: Take 5 mg by mouth daily. 08/15/17   [name], MD  loperamide (IMODIUM) 2 MG capsule Take 2 mg by mouth 4 (four) times daily as needed. 05/09/21   [provider]  loratadine (CLARITIN) 10 MG tablet Take 10 mg by mouth daily.    [provider]  metFORMIN (GLUCOPHAGE) 500 MG tablet Take 1 tablet (500 mg total) by mouth 2 (two) times daily with a meal. 12/11/14   [name], MD  nitroGLYCERIN (NITROSTAT) 0.4 MG SL tablet Place 1 tablet (0.4 mg total) under the tongue every 5 (five) minutes as needed for chest pain. 06/19/18   [name], MD  omeprazole (PRILOSEC) 20 MG capsule Take 20 mg by mouth 2 (two) times daily. 05/19/21   [provider]  ondansetron (ZOFRAN-ODT) 4 MG disintegrating tablet 36m ODT q4 hours prn nausea/vomit Patient taking differently: Take 4 mg by mouth every 8 (eight) hours as needed for vomiting or nausea. 03/09/21   YDrenda Freeze MD  oxyCODONE-acetaminophen (PERCOCET) 10-325 MG tablet Take 1 tablet by mouth every 6 (six) hours as needed for pain. 12/03/19   [provider]  OXYGEN Inhale 4 L into the lungs continuous.    [provider]  phentermine (ADIPEX-P) 37.5 MG tablet Take 37.5 mg by mouth daily. 05/25/21   [provider]  potassium chloride SA (K-DUR,KLOR-CON) 20 MEQ tablet Take 1 tablet (20  mEq total) by mouth 2 (two) times daily. 07/24/18   MLarey Dresser MD  rosuvastatin (CRESTOR) 10 MG tablet Take 10 mg by mouth every evening. 11/19/19   [provider]  senna-docusate (SENOKOT-S) 8.6-50 MG tablet Take 1 tablet by mouth daily.    [provider]  torsemide (DEMADEX) 20 MG tablet TAKE 2 TABLETS (40 MG TOTAL) BY MOUTH 2 (TWO) TIMES DAILY. 07/05/21   MLarey Dresser MD  traZODone (DESYREL) 50 MG tablet Take 100 mg by mouth at bedtime. 11/19/19   [provider]  triamcinolone cream (KENALOG) 0.1 % Apply 1 application. topically 2 (two) times daily as needed (irrritation). 09/10/20   [provider]  umeclidinium-vilanterol (ANORO ELLIPTA) 62.5-25 MCG/INH AEPB  Inhale 1 puff into the lungs daily.    [provider]      Allergies    Bee venom, Fioricet [butalbital-apap-caffeine], Ibuprofen, Lamisil [terbinafine], and Nsaids    Review of Systems   Review of Systems  Physical Exam Updated Vital Signs BP (!) 113/54   Pulse 80   Temp 98.1 F (36.7 C) (Oral)   Resp 17   Ht '5\' 5"'  (1.651 m)   Wt 136.1 kg   SpO2 96%   BMI 49.92 kg/m  Physical Exam Vitals and nursing note reviewed.  Constitutional:      General: She is not in acute distress.    Appearance: She is well-developed. She is not ill-appearing.  HENT:     Head: Normocephalic and atraumatic.     Nose: Nose normal.     Mouth/Throat:     Mouth: Mucous membranes are moist.  Eyes:     Extraocular Movements: Extraocular movements intact.     Conjunctiva/sclera: Conjunctivae normal.     Pupils: Pupils are equal, round, and reactive to light.  Cardiovascular:     Rate and Rhythm: Normal rate and regular rhythm.     Pulses: Normal pulses.     Heart sounds: Normal heart sounds. No murmur heard. Pulmonary:     Effort: Pulmonary effort is normal. No respiratory distress.     Breath sounds: Normal breath sounds.  Abdominal:     Palpations: Abdomen is soft.     Tenderness:  There is no abdominal tenderness.  Musculoskeletal:        General: No swelling.     Cervical back: Neck supple.     Comments: Trace swelling bilateral lower extremities  Skin:    General: Skin is warm and dry.     Capillary Refill: Capillary refill takes less than 2 seconds.  Neurological:     General: No focal deficit present.     Mental Status: She is alert.  Psychiatric:        Mood and Affect: Mood normal.     ED Results / Procedures / Treatments   Labs (all labs ordered are listed, but only abnormal results are displayed) Labs Reviewed  BASIC METABOLIC PANEL - Abnormal; Notable for the following components:      Result Value   CO2 33 (*)    Creatinine, Ser 1.11 (*)    Calcium 8.7 (*)    GFR, Estimated 57 (*)    All other components within normal limits  CBC WITH DIFFERENTIAL/PLATELET - Abnormal; Notable for the following components:   WBC 10.6 (*)    RBC 3.16 (*)    Hemoglobin 9.4 (*)    HCT 30.3 (*)    All other components within normal limits  BRAIN NATRIURETIC PEPTIDE  TROPONIN I (HIGH SENSITIVITY)  TROPONIN I (HIGH SENSITIVITY)    EKG EKG Interpretation  Date/Time:  Friday November 11 2021 14:38:27 EDT Ventricular Rate:  82 PR Interval:  176 QRS Duration: 70 QT Interval:  370 QTC Calculation: 432 R Axis:   43 Text Interpretation: Normal sinus rhythm When compared with ECG of 15-Sep-2021 11:26, PREVIOUS ECG IS PRESENT Confirmed by Lennice Sites ([id number]) on 11/11/2021 5:59:06 PM  Radiology DG Chest 2 View  Result Date: 11/11/2021 CLINICAL DATA:  Provided history: Shortness of breath. EXAM: CHEST - 2 VIEW COMPARISON:  Prior chest radiographs 09/14/2021 and earlier. Chest CT 09/15/2021. FINDINGS: Mild cardiomegaly. Bandlike opacities within the right mid to lower lung field and left lung base, which may reflect atelectasis  and/or scarring. Additional ill-defined opacities within the bilateral lung bases, which may reflect atelectasis, pneumonia or pulmonary edema.  No appreciable pleural effusion. No evidence of pneumothorax. Degenerative changes of the spine. IMPRESSION: Ill-defined opacities within the bilateral lung bases, which may reflect atelectasis, pneumonia and/ pulmonary edema. Superimposed bandlike opacities within the right mid-to-lower lung field and left lung base, which may reflect atelectasis and/or scarring. Mild cardiomegaly, unchanged. Electronically Signed   By: Kellie Simmering D.O.   On: 11/11/2021 15:20    Procedures Procedures    Medications Ordered in ED Medications  furosemide (LASIX) injection 40 mg (has no administration in time range)    ED Course/ Medical Decision Making/ A&P                           Medical Decision Making Risk Prescription drug management.   Michele Owens is here with leg swelling.  Normal vitals.  No fever.  She is on chronic oxygen.  History of heart failure, COPD.  Well-appearing.  No real obvious pitting edema in her lower legs.  She is got some chronic left knee problems but there is no obvious knee swelling.  She states that she is retaining fluid.  She wants to increase her torsemide.  Overall she looks comfortable.  No signs of respiratory distress.  Differential diagnosis is may be volume overload in the setting of heart failure exacerbation or may be peripheral edema/venous stasis.  I have very low concern for ACS or other acute process.  We will get CBC, BMP, BNP, troponin, chest x-ray.  EKG done in triage shows sinus rhythm.  No ischemic changes.  Per my review and interpretation of labs, troponin negative x2.  BNP is normal.  Chest x-ray with may be mild edema.  No significant leukocytosis or anemia or electrolyte abnormality otherwise.  Patient was given a dose of furosemide here in the ED.  We will have her slightly increase her torsemide to 60 mg in the morning and keep 40 mg in the evening.  We will have her follow-up with her primary care doctor.  Told her to do this for about 4 days.   Discharged in good condition.  This chart was dictated using voice recognition software.  Despite best efforts to proofread,  errors can occur which can change the documentation meaning.         Final Clinical Impression(s) / ED Diagnoses Final diagnoses:  Leg swelling    Rx / DC Orders ED Discharge Orders     None         Lennice Sites, DO 11/11/21 [id number]

## 2021-11-11 NOTE — ED Triage Notes (Addendum)
Complaining of left knee pain due to eating a hamburger causing a gout flare up.  Patient wears 3L [place] at home Patient appears sedated in triage and can hardly hold her eyas open  When PA went to assess patient she denied gout issues and reports she needs and IV with lasix due to all the fluid in her legs.

## 2021-11-14 DIAGNOSIS — I110 Hypertensive heart disease with heart failure: Secondary | ICD-10-CM | POA: Diagnosis not present

## 2021-11-15 DIAGNOSIS — I110 Hypertensive heart disease with heart failure: Secondary | ICD-10-CM | POA: Diagnosis not present

## 2021-11-17 DIAGNOSIS — I110 Hypertensive heart disease with heart failure: Secondary | ICD-10-CM | POA: Diagnosis not present

## 2021-11-18 DIAGNOSIS — I110 Hypertensive heart disease with heart failure: Secondary | ICD-10-CM | POA: Diagnosis not present

## 2021-11-21 DIAGNOSIS — I110 Hypertensive heart disease with heart failure: Secondary | ICD-10-CM | POA: Diagnosis not present

## 2021-11-22 DIAGNOSIS — I110 Hypertensive heart disease with heart failure: Secondary | ICD-10-CM | POA: Diagnosis not present

## 2021-11-23 DIAGNOSIS — I110 Hypertensive heart disease with heart failure: Secondary | ICD-10-CM | POA: Diagnosis not present

## 2021-11-25 DIAGNOSIS — I110 Hypertensive heart disease with heart failure: Secondary | ICD-10-CM | POA: Diagnosis not present

## 2021-11-28 DIAGNOSIS — I110 Hypertensive heart disease with heart failure: Secondary | ICD-10-CM | POA: Diagnosis not present

## 2021-11-29 DIAGNOSIS — I110 Hypertensive heart disease with heart failure: Secondary | ICD-10-CM | POA: Diagnosis not present

## 2021-11-29 DIAGNOSIS — M25562 Pain in left knee: Secondary | ICD-10-CM | POA: Diagnosis not present

## 2021-11-29 DIAGNOSIS — M25561 Pain in right knee: Secondary | ICD-10-CM | POA: Diagnosis not present

## 2021-11-30 DIAGNOSIS — I110 Hypertensive heart disease with heart failure: Secondary | ICD-10-CM | POA: Diagnosis not present

## 2021-11-30 DIAGNOSIS — M179 Osteoarthritis of knee, unspecified: Secondary | ICD-10-CM | POA: Diagnosis not present

## 2021-11-30 DIAGNOSIS — E669 Obesity, unspecified: Secondary | ICD-10-CM | POA: Diagnosis not present

## 2021-11-30 DIAGNOSIS — E119 Type 2 diabetes mellitus without complications: Secondary | ICD-10-CM | POA: Diagnosis not present

## 2021-11-30 DIAGNOSIS — J449 Chronic obstructive pulmonary disease, unspecified: Secondary | ICD-10-CM | POA: Diagnosis not present

## 2021-11-30 DIAGNOSIS — G894 Chronic pain syndrome: Secondary | ICD-10-CM | POA: Diagnosis not present

## 2021-12-01 DIAGNOSIS — I110 Hypertensive heart disease with heart failure: Secondary | ICD-10-CM | POA: Diagnosis not present

## 2021-12-05 DIAGNOSIS — I110 Hypertensive heart disease with heart failure: Secondary | ICD-10-CM | POA: Diagnosis not present

## 2021-12-06 DIAGNOSIS — I110 Hypertensive heart disease with heart failure: Secondary | ICD-10-CM | POA: Diagnosis not present

## 2021-12-07 DIAGNOSIS — I110 Hypertensive heart disease with heart failure: Secondary | ICD-10-CM | POA: Diagnosis not present

## 2021-12-08 DIAGNOSIS — I110 Hypertensive heart disease with heart failure: Secondary | ICD-10-CM | POA: Diagnosis not present

## 2021-12-09 DIAGNOSIS — Z419 Encounter for procedure for purposes other than remedying health state, unspecified: Secondary | ICD-10-CM | POA: Diagnosis not present

## 2021-12-12 DIAGNOSIS — I110 Hypertensive heart disease with heart failure: Secondary | ICD-10-CM | POA: Diagnosis not present

## 2021-12-13 DIAGNOSIS — I110 Hypertensive heart disease with heart failure: Secondary | ICD-10-CM | POA: Diagnosis not present

## 2021-12-14 DIAGNOSIS — I110 Hypertensive heart disease with heart failure: Secondary | ICD-10-CM | POA: Diagnosis not present

## 2021-12-15 DIAGNOSIS — I110 Hypertensive heart disease with heart failure: Secondary | ICD-10-CM | POA: Diagnosis not present

## 2021-12-19 DIAGNOSIS — I110 Hypertensive heart disease with heart failure: Secondary | ICD-10-CM | POA: Diagnosis not present

## 2021-12-20 DIAGNOSIS — I110 Hypertensive heart disease with heart failure: Secondary | ICD-10-CM | POA: Diagnosis not present

## 2021-12-21 DIAGNOSIS — I110 Hypertensive heart disease with heart failure: Secondary | ICD-10-CM | POA: Diagnosis not present

## 2021-12-22 DIAGNOSIS — I110 Hypertensive heart disease with heart failure: Secondary | ICD-10-CM | POA: Diagnosis not present

## 2021-12-23 DIAGNOSIS — I110 Hypertensive heart disease with heart failure: Secondary | ICD-10-CM | POA: Diagnosis not present

## 2021-12-26 DIAGNOSIS — I110 Hypertensive heart disease with heart failure: Secondary | ICD-10-CM | POA: Diagnosis not present

## 2021-12-27 DIAGNOSIS — I110 Hypertensive heart disease with heart failure: Secondary | ICD-10-CM | POA: Diagnosis not present

## 2021-12-29 DIAGNOSIS — I110 Hypertensive heart disease with heart failure: Secondary | ICD-10-CM | POA: Diagnosis not present

## 2021-12-30 ENCOUNTER — Emergency Department (HOSPITAL_COMMUNITY)
Admission: EM | Admit: 2021-12-30 | Discharge: 2021-12-30 | Disposition: A | Discharge status: Home / Self Care | Payer: Medicaid Other | Attending: Emergency Medicine | Admitting: Emergency Medicine

## 2021-12-30 ENCOUNTER — Encounter (HOSPITAL_COMMUNITY): Payer: Self-pay

## 2021-12-30 ENCOUNTER — Other Ambulatory Visit: Payer: Self-pay

## 2021-12-30 ENCOUNTER — Emergency Department (HOSPITAL_COMMUNITY): Payer: Medicaid Other

## 2021-12-30 DIAGNOSIS — R799 Abnormal finding of blood chemistry, unspecified: Secondary | ICD-10-CM | POA: Insufficient documentation

## 2021-12-30 DIAGNOSIS — M549 Dorsalgia, unspecified: Secondary | ICD-10-CM | POA: Diagnosis not present

## 2021-12-30 DIAGNOSIS — W19XXXA Unspecified fall, initial encounter: Secondary | ICD-10-CM | POA: Insufficient documentation

## 2021-12-30 DIAGNOSIS — F4321 Adjustment disorder with depressed mood: Secondary | ICD-10-CM

## 2021-12-30 DIAGNOSIS — R4182 Altered mental status, unspecified: Secondary | ICD-10-CM | POA: Insufficient documentation

## 2021-12-30 DIAGNOSIS — Z7982 Long term (current) use of aspirin: Secondary | ICD-10-CM | POA: Diagnosis not present

## 2021-12-30 DIAGNOSIS — F4381 Prolonged grief disorder: Secondary | ICD-10-CM | POA: Diagnosis not present

## 2021-12-30 DIAGNOSIS — F4320 Adjustment disorder, unspecified: Secondary | ICD-10-CM | POA: Diagnosis not present

## 2021-12-30 DIAGNOSIS — R0902 Hypoxemia: Secondary | ICD-10-CM | POA: Diagnosis not present

## 2021-12-30 DIAGNOSIS — F32A Depression, unspecified: Secondary | ICD-10-CM | POA: Diagnosis present

## 2021-12-30 DIAGNOSIS — I10 Essential (primary) hypertension: Secondary | ICD-10-CM | POA: Diagnosis not present

## 2021-12-30 DIAGNOSIS — Z743 Need for continuous supervision: Secondary | ICD-10-CM | POA: Diagnosis not present

## 2021-12-30 DIAGNOSIS — I959 Hypotension, unspecified: Secondary | ICD-10-CM | POA: Diagnosis not present

## 2021-12-30 DIAGNOSIS — I110 Hypertensive heart disease with heart failure: Secondary | ICD-10-CM | POA: Diagnosis not present

## 2021-12-30 LAB — CBG MONITORING, ED: Glucose-Capillary: 107 mg/dL — ABNORMAL HIGH (ref 70–99)

## 2021-12-30 MED ORDER — OXYCODONE-ACETAMINOPHEN 5-325 MG PO TABS
2.0000 | ORAL_TABLET | Freq: Once | ORAL | Status: DC
  Filled 2021-12-30: qty 2

## 2021-12-30 NOTE — ED Notes (Signed)
Unable to get labs ?

## 2021-12-30 NOTE — ED Notes (Signed)
Patient sleeping when I entered the room. Woke patient up and asked if she was in pain patient states no.

## 2021-12-30 NOTE — ED Provider Triage Note (Signed)
Emergency Medicine Provider Triage Evaluation Note  Michele Owens , a 61 y.o. female  was evaluated in triage.  Pt complains of fall onset PTA.  Patient notes that she fell while at the nail salon today.  Denies feeling dizzy or lightheaded.  Per EMS notes that she was at the nail salon today and then began to act out of the norm for herself. Denies chest pain, shortness of breath, abdominal pain, nausea, vomiting.  Notes that she went to her deceased son's grave prior to the onset of her symptoms. Denies SI/HI, auditory/visual hallucinations.  Review of Systems  Positive:  Negative:   Physical Exam  BP (!) 93/56   Pulse 77   Temp 97.8 F (36.6 C) (Oral)   Resp 18   SpO2 100%  Gen:   Awake, no distress   Resp:  Normal effort  MSK:   Moves extremities without difficulty  Other:    Medical Decision Making  Medically screening exam initiated at 3:19 PM.  Appropriate orders placed.  Fabianna Keats was informed that the remainder of the evaluation will be completed by another provider, this initial triage assessment does not replace that evaluation, and the importance of remaining in the ED until their evaluation is complete.  Work-up initiated.    Teague Goynes A, PA-C 12/30/21 1541

## 2021-12-30 NOTE — ED Provider Notes (Signed)
[place] EMERGENCY DEPARTMENT Provider Note   CSN: [id number] Arrival date & time: 12/30/21  1455     History {Add pertinent medical, surgical, social history, OB history to HPI:1} Chief Complaint  Patient presents with   Fall   Altered Mental Status    Michele Owens is a 61 y.o. female.  She was brought in by EMS after being altered while at the nail salon.  She said she was getting her nails done and she was thinking about her deceased son and became overwhelmed.  She laid herself down on the floor and was unable to move.  EMS brought her here.  She denies any acute medical complaints.  She said she is very sad about her family although does not want to hurt herself.  The history is provided by the patient.  Altered Mental Status Presenting symptoms: partial responsiveness   Most recent episode:  Today Episode history:  Single Progression:  Resolved Context comment:  Stressors Associated symptoms: depression   Associated symptoms: no abdominal pain, no difficulty breathing, no fever, no nausea, no suicidal behavior and no vomiting        Home Medications Prior to Admission medications   Medication Sig Start Date End Date Taking? Authorizing Provider  acetaminophen (TYLENOL) 500 MG tablet Take 1,000 mg by mouth every 6 (six) hours as needed for moderate pain or headache.    [provider]  albuterol (PROVENTIL HFA;VENTOLIN HFA) 108 (90 Base) MCG/ACT inhaler Inhale 1-2 puffs into the lungs every 6 (six) hours as needed for wheezing or shortness of breath. 04/23/18   Long, [name], MD  allopurinol (ZYLOPRIM) 100 MG tablet Take 100 mg by mouth daily.    [provider]  aspirin EC 81 MG tablet Take 81 mg by mouth daily. Swallow whole.    [provider]  benzonatate (TESSALON) 200 MG capsule Take 1 capsule (200 mg total) by mouth in the morning, at noon, and at bedtime. 7 DS Patient taking differently: Take 200 mg by mouth 3  (three) times daily as needed for cough. 04/13/20   Regalado, Belkys A, MD  blood glucose meter kit and supplies KIT Dispense based on patient and insurance preference. Use up to four times daily as directed. 12/25/20   Little Ishikawa, MD  busPIRone (BUSPAR) 5 MG tablet Take 5 mg by mouth 3 (three) times daily as needed (anxiety).    [provider]  Butalbital-APAP-Caffeine 50-300-40 MG CAPS Take 1 capsule by mouth 2 (two) times daily as needed for headache.    [provider]  calcitRIOL (ROCALTROL) 0.5 MCG capsule Take 1 capsule (0.5 mcg total) by mouth daily. 09/26/16   Hongalgi, [name], MD  carvedilol (COREG) 6.25 MG tablet Take 1 tablet (6.25 mg total) by mouth 2 (two) times daily with a meal. 01/23/18   Bensimhon, [name], MD  cefdinir (OMNICEF) 300 MG capsule Take 1 capsule (300 mg total) by mouth 2 (two) times daily. 08/30/21   Quintella Reichert, MD  clonazePAM (KLONOPIN) 2 MG tablet Take 1 tablet (2 mg total) by mouth 3 (three) times daily. for anxiety Patient taking differently: Take 2 mg by mouth 3 (three) times daily. 06/23/19   [name], MD  cyclobenzaprine (FLEXERIL) 10 MG tablet Take 10 mg by mouth 3 (three) times daily. 11/19/19   [provider]  diclofenac sodium (VOLTAREN) 1 % GEL Apply 4 g topically 4 (four) times daily as needed (for pain). 04/27/21   Nanavati, Ankit,  MD  dicyclomine (BENTYL) 20 MG tablet Take 1 tablet (20 mg total) by mouth 2 (two) times daily as needed for spasms (abdominal cramping). 06/10/21   Mesner, Corene Cornea, MD  doxycycline (VIBRAMYCIN) 100 MG capsule Take 1 capsule (100 mg total) by mouth 2 (two) times daily. One po bid x 7 days Patient not taking: Reported on 06/27/2021 03/16/21   Deno Etienne, DO  doxycycline (VIBRAMYCIN) 100 MG capsule Take 1 capsule (100 mg total) by mouth 2 (two) times daily. 08/30/21   Quintella Reichert, MD  EPINEPHrine 0.3 mg/0.3 mL IJ SOAJ injection Inject 0.3 mg into the muscle once as needed for anaphylaxis.     [provider]  fenofibrate (TRICOR) 145 MG tablet Take 145 mg by mouth daily. 06/07/21   [provider]  FEROSUL 325 (65 Fe) MG tablet Take 325 mg by mouth daily. 03/22/17   [provider]  glucose blood test strip Use as instructed to check blood sugar up to 4 times daily Patient taking differently: 1 each by Other route See admin instructions. Use as instructed to check blood sugar up to 4 times daily 08/06/17   [name], DO  guaiFENesin (DIABETIC SILTUSSIN DAS-NA PO) Take 5 mLs by mouth at bedtime as needed (cough).    [provider]  ipratropium-albuterol (DUONEB) 0.5-2.5 (3) MG/3ML SOLN Take 3 mLs by nebulization 3 (three) times daily. 04/05/17   [provider]  levothyroxine (SYNTHROID, LEVOTHROID) 112 MCG tablet Take 224 mcg by mouth daily before breakfast. 07/11/17   [provider]  lisinopril (PRINIVIL,ZESTRIL) 5 MG tablet Take 1 tablet (5 mg total) by mouth daily. Please call for office visit [phone] Patient taking differently: Take 5 mg by mouth daily. 08/15/17   [name], MD  loperamide (IMODIUM) 2 MG capsule Take 2 mg by mouth 4 (four) times daily as needed. 05/09/21   [provider]  loratadine (CLARITIN) 10 MG tablet Take 10 mg by mouth daily.    [provider]  metFORMIN (GLUCOPHAGE) 500 MG tablet Take 1 tablet (500 mg total) by mouth 2 (two) times daily with a meal. 12/11/14   [name], MD  nitroGLYCERIN (NITROSTAT) 0.4 MG SL tablet Place 1 tablet (0.4 mg total) under the tongue every 5 (five) minutes as needed for chest pain. 06/19/18   [name], MD  omeprazole (PRILOSEC) 20 MG capsule Take 20 mg by mouth 2 (two) times daily. 05/19/21   [provider]  ondansetron (ZOFRAN-ODT) 4 MG disintegrating tablet 81m ODT q4 hours prn nausea/vomit Patient taking differently: Take 4 mg by mouth every 8 (eight) hours as needed for vomiting or nausea. 03/09/21   YDrenda Freeze MD  oxyCODONE-acetaminophen (PERCOCET) 10-325 MG tablet Take 1 tablet by mouth every 6 (six) hours as needed for pain. 12/03/19   [provider]  OXYGEN Inhale 4 L into the lungs continuous.    [provider]  phentermine (ADIPEX-P) 37.5 MG tablet Take 37.5 mg by mouth daily. 05/25/21   [provider]  potassium chloride SA (K-DUR,KLOR-CON) 20 MEQ tablet Take 1 tablet (20 mEq total) by mouth 2 (two) times daily. 07/24/18   MLarey Dresser MD  rosuvastatin (CRESTOR) 10 MG tablet Take 10 mg by mouth every evening. 11/19/19   [provider]  senna-docusate (SENOKOT-S) 8.6-50 MG tablet Take 1 tablet by mouth daily.    [provider]  torsemide (DEMADEX) 20 MG tablet TAKE 2 TABLETS (40 MG TOTAL) BY MOUTH 2 (  TWO) TIMES DAILY. 07/05/21   [name], MD  traZODone (DESYREL) 50 MG tablet Take 100 mg by mouth at bedtime. 11/19/19   [provider]  triamcinolone cream (KENALOG) 0.1 % Apply 1 application. topically 2 (two) times daily as needed (irrritation). 09/10/20   [provider]  umeclidinium-vilanterol (ANORO ELLIPTA) 62.5-25 MCG/INH AEPB Inhale 1 puff into the lungs daily.    [provider]      Allergies    Bee venom, Fioricet [butalbital-apap-caffeine], Ibuprofen, Lamisil [terbinafine], and Nsaids    Review of Systems   Review of Systems  Constitutional:  Negative for fever.  Gastrointestinal:  Negative for abdominal pain, nausea and vomiting.  Psychiatric/Behavioral:  Negative for suicidal ideas.     Physical Exam Updated Vital Signs BP (!) 93/56   Pulse 77   Temp 97.8 F (36.6 C) (Oral)   Resp 18   SpO2 100%  Physical Exam Vitals and nursing note reviewed.  Constitutional:      General: She is not in acute distress.    Appearance: Normal appearance. She is well-developed. She is obese.  HENT:     Head: Normocephalic and atraumatic.  Eyes:     Conjunctiva/sclera: Conjunctivae normal.   Cardiovascular:     Rate and Rhythm: Normal rate and regular rhythm.     Heart sounds: No murmur heard. Pulmonary:     Effort: Pulmonary effort is normal. No respiratory distress.     Breath sounds: Normal breath sounds.  Abdominal:     Palpations: Abdomen is soft.     Tenderness: There is no abdominal tenderness. There is no guarding or rebound.  Musculoskeletal:        General: No swelling.     Cervical back: Neck supple.  Skin:    General: Skin is warm and dry.     Capillary Refill: Capillary refill takes less than 2 seconds.  Neurological:     General: No focal deficit present.     Mental Status: She is alert.     Motor: No weakness.     ED Results / Procedures / Treatments   Labs (all labs ordered are listed, but only abnormal results are displayed) Labs Reviewed  CBG MONITORING, ED    EKG EKG Interpretation  Date/Time:  Friday December 30 2021 16:41:48 EDT Ventricular Rate:  88 PR Interval:  188 QRS Duration: 74 QT Interval:  368 QTC Calculation: 445 R Axis:   19 Text Interpretation: Normal sinus rhythm Cannot rule out Anterior infarct , age undetermined Abnormal ECG When compared with ECG of 11-Nov-2021 14:38, No significant change since last tracing Confirmed by Aletta Edouard [phone]) on 12/30/2021 5:05:03 PM  Radiology CT Head Wo Contrast  Result Date: 12/30/2021 CLINICAL DATA:  Altered mental status. EXAM: CT HEAD WITHOUT CONTRAST TECHNIQUE: Contiguous axial images were obtained from the base of the skull through the vertex without intravenous contrast. RADIATION DOSE REDUCTION: This exam was performed according to the departmental dose-optimization program which includes automated exposure control, adjustment of the mA and/or kV according to patient size and/or use of iterative reconstruction technique. COMPARISON:  June 27, 2021. FINDINGS: Brain: No evidence of acute infarction, hemorrhage, hydrocephalus, extra-axial collection or mass lesion/mass effect.  Vascular: No hyperdense vessel or unexpected calcification. Skull: Normal. Negative for fracture or focal lesion. Sinuses/Orbits: No acute finding. Other: None. IMPRESSION: No acute intracranial abnormality seen. Electronically Signed   By: Marijo Conception M.D.   On: 12/30/2021 15:36    Procedures Procedures  {Document  cardiac monitor, telemetry assessment procedure when appropriate:1}  Medications Ordered in ED Medications - No data to display  ED Course/ Medical Decision Making/ A&P                           Medical Decision Making  This patient complains of ***; this involves an extensive number of treatment Options and is a complaint that carries with it a high risk of complications and morbidity. The differential includes ***  I ordered, reviewed and interpreted labs, which included *** I ordered medication *** and reviewed PMP when indicated. I ordered imaging studies which included *** and I independently    visualized and interpreted imaging which showed *** Additional history obtained from *** Previous records obtained and reviewed *** I consulted *** and discussed lab and imaging findings and discussed disposition.  Cardiac monitoring reviewed, *** Social determinants considered, *** Critical Interventions: ***  After the interventions stated above, I reevaluated the patient and found *** Admission and further testing considered, ***   {Document critical care time when appropriate:1} {Document review of labs and clinical decision tools ie heart score, Chads2Vasc2 etc:1}  {Document your independent review of radiology images, and any outside records:1} {Document your discussion with family members, caretakers, and with consultants:1} {Document social determinants of health affecting pt's care:1} {Document your decision making why or why not admission, treatments were needed:1} Final Clinical Impression(s) / ED Diagnoses Final diagnoses:  None    Rx / DC Orders ED  Discharge Orders     None

## 2021-12-30 NOTE — Discharge Instructions (Signed)
You were seen in the emergency department after a minor fall at the nail salon.  After you ate and drank something you were feeling okay to be discharged.  Please follow-up with your primary care doctor and if you experience more depression symptoms follow-up with behavioral health urgent care.  Continue your regular medications.  Return to the emergency department if any worsening or concerning symptoms

## 2021-12-30 NOTE — ED Triage Notes (Signed)
Pt BIB [facility] from a nail salon where bystanders saw patient acting weird and talking to herself and out the side of her head. Pt then put herself in the floor according to the bystander she slide down into the floor did not fall or hit her head. Pt would not answer questions for ems but did state her son passed away a week ago.

## 2022-01-03 DIAGNOSIS — I110 Hypertensive heart disease with heart failure: Secondary | ICD-10-CM | POA: Diagnosis not present

## 2022-01-04 DIAGNOSIS — I110 Hypertensive heart disease with heart failure: Secondary | ICD-10-CM | POA: Diagnosis not present

## 2022-01-05 DIAGNOSIS — I110 Hypertensive heart disease with heart failure: Secondary | ICD-10-CM | POA: Diagnosis not present

## 2022-01-06 DIAGNOSIS — I110 Hypertensive heart disease with heart failure: Secondary | ICD-10-CM | POA: Diagnosis not present

## 2022-01-08 DIAGNOSIS — Z419 Encounter for procedure for purposes other than remedying health state, unspecified: Secondary | ICD-10-CM | POA: Diagnosis not present

## 2022-01-09 DIAGNOSIS — I110 Hypertensive heart disease with heart failure: Secondary | ICD-10-CM | POA: Diagnosis not present

## 2022-01-10 DIAGNOSIS — I110 Hypertensive heart disease with heart failure: Secondary | ICD-10-CM | POA: Diagnosis not present

## 2022-01-11 DIAGNOSIS — L84 Corns and callosities: Secondary | ICD-10-CM | POA: Diagnosis not present

## 2022-01-11 DIAGNOSIS — I110 Hypertensive heart disease with heart failure: Secondary | ICD-10-CM | POA: Diagnosis not present

## 2022-01-11 DIAGNOSIS — E119 Type 2 diabetes mellitus without complications: Secondary | ICD-10-CM | POA: Diagnosis not present

## 2022-01-11 DIAGNOSIS — J449 Chronic obstructive pulmonary disease, unspecified: Secondary | ICD-10-CM | POA: Diagnosis not present

## 2022-01-11 DIAGNOSIS — Z9981 Dependence on supplemental oxygen: Secondary | ICD-10-CM | POA: Diagnosis not present

## 2022-01-12 DIAGNOSIS — I110 Hypertensive heart disease with heart failure: Secondary | ICD-10-CM | POA: Diagnosis not present

## 2022-01-13 DIAGNOSIS — I110 Hypertensive heart disease with heart failure: Secondary | ICD-10-CM | POA: Diagnosis not present

## 2022-01-16 DIAGNOSIS — I110 Hypertensive heart disease with heart failure: Secondary | ICD-10-CM | POA: Diagnosis not present

## 2022-01-17 DIAGNOSIS — I110 Hypertensive heart disease with heart failure: Secondary | ICD-10-CM | POA: Diagnosis not present

## 2022-01-18 DIAGNOSIS — I110 Hypertensive heart disease with heart failure: Secondary | ICD-10-CM | POA: Diagnosis not present

## 2022-01-19 DIAGNOSIS — I110 Hypertensive heart disease with heart failure: Secondary | ICD-10-CM | POA: Diagnosis not present

## 2022-01-23 DIAGNOSIS — I110 Hypertensive heart disease with heart failure: Secondary | ICD-10-CM | POA: Diagnosis not present

## 2022-01-24 DIAGNOSIS — I110 Hypertensive heart disease with heart failure: Secondary | ICD-10-CM | POA: Diagnosis not present

## 2022-01-27 DIAGNOSIS — I110 Hypertensive heart disease with heart failure: Secondary | ICD-10-CM | POA: Diagnosis not present

## 2022-01-31 ENCOUNTER — Encounter (HOSPITAL_COMMUNITY): Payer: Self-pay

## 2022-01-31 ENCOUNTER — Emergency Department (HOSPITAL_COMMUNITY): Payer: Medicaid Other

## 2022-01-31 ENCOUNTER — Inpatient Hospital Stay (HOSPITAL_COMMUNITY)
Admission: EM | Admit: 2022-01-31 | Discharge: 2022-02-03 | DRG: 683 | Disposition: A | Discharge status: Home / Self Care | Payer: Medicaid Other | Attending: Internal Medicine | Admitting: Internal Medicine

## 2022-01-31 ENCOUNTER — Other Ambulatory Visit: Payer: Self-pay

## 2022-01-31 DIAGNOSIS — F419 Anxiety disorder, unspecified: Secondary | ICD-10-CM | POA: Diagnosis present

## 2022-01-31 DIAGNOSIS — G894 Chronic pain syndrome: Secondary | ICD-10-CM | POA: Diagnosis present

## 2022-01-31 DIAGNOSIS — R1084 Generalized abdominal pain: Secondary | ICD-10-CM | POA: Diagnosis not present

## 2022-01-31 DIAGNOSIS — N179 Acute kidney failure, unspecified: Principal | ICD-10-CM | POA: Diagnosis present

## 2022-01-31 DIAGNOSIS — J4489 Other specified chronic obstructive pulmonary disease: Secondary | ICD-10-CM | POA: Diagnosis present

## 2022-01-31 DIAGNOSIS — F32A Depression, unspecified: Secondary | ICD-10-CM | POA: Diagnosis present

## 2022-01-31 DIAGNOSIS — I959 Hypotension, unspecified: Secondary | ICD-10-CM | POA: Diagnosis present

## 2022-01-31 DIAGNOSIS — K5900 Constipation, unspecified: Secondary | ICD-10-CM | POA: Diagnosis present

## 2022-01-31 DIAGNOSIS — R079 Chest pain, unspecified: Secondary | ICD-10-CM | POA: Diagnosis not present

## 2022-01-31 DIAGNOSIS — R112 Nausea with vomiting, unspecified: Secondary | ICD-10-CM | POA: Diagnosis present

## 2022-01-31 DIAGNOSIS — Z888 Allergy status to other drugs, medicaments and biological substances status: Secondary | ICD-10-CM

## 2022-01-31 DIAGNOSIS — E861 Hypovolemia: Secondary | ICD-10-CM | POA: Diagnosis present

## 2022-01-31 DIAGNOSIS — E860 Dehydration: Secondary | ICD-10-CM | POA: Diagnosis present

## 2022-01-31 DIAGNOSIS — J9811 Atelectasis: Secondary | ICD-10-CM | POA: Diagnosis present

## 2022-01-31 DIAGNOSIS — Z808 Family history of malignant neoplasm of other organs or systems: Secondary | ICD-10-CM

## 2022-01-31 DIAGNOSIS — G8929 Other chronic pain: Secondary | ICD-10-CM | POA: Diagnosis present

## 2022-01-31 DIAGNOSIS — I5032 Chronic diastolic (congestive) heart failure: Secondary | ICD-10-CM | POA: Diagnosis present

## 2022-01-31 DIAGNOSIS — M1990 Unspecified osteoarthritis, unspecified site: Secondary | ICD-10-CM | POA: Diagnosis present

## 2022-01-31 DIAGNOSIS — Z886 Allergy status to analgesic agent status: Secondary | ICD-10-CM

## 2022-01-31 DIAGNOSIS — Z1152 Encounter for screening for COVID-19: Secondary | ICD-10-CM

## 2022-01-31 DIAGNOSIS — R509 Fever, unspecified: Secondary | ICD-10-CM | POA: Diagnosis not present

## 2022-01-31 DIAGNOSIS — K219 Gastro-esophageal reflux disease without esophagitis: Secondary | ICD-10-CM | POA: Diagnosis present

## 2022-01-31 DIAGNOSIS — Z6841 Body Mass Index (BMI) 40.0 and over, adult: Secondary | ICD-10-CM

## 2022-01-31 DIAGNOSIS — I130 Hypertensive heart and chronic kidney disease with heart failure and stage 1 through stage 4 chronic kidney disease, or unspecified chronic kidney disease: Secondary | ICD-10-CM | POA: Diagnosis present

## 2022-01-31 DIAGNOSIS — Z9981 Dependence on supplemental oxygen: Secondary | ICD-10-CM

## 2022-01-31 DIAGNOSIS — Z79899 Other long term (current) drug therapy: Secondary | ICD-10-CM

## 2022-01-31 DIAGNOSIS — E039 Hypothyroidism, unspecified: Secondary | ICD-10-CM | POA: Diagnosis present

## 2022-01-31 DIAGNOSIS — E890 Postprocedural hypothyroidism: Secondary | ICD-10-CM | POA: Diagnosis present

## 2022-01-31 DIAGNOSIS — G43909 Migraine, unspecified, not intractable, without status migrainosus: Secondary | ICD-10-CM | POA: Diagnosis present

## 2022-01-31 DIAGNOSIS — Z833 Family history of diabetes mellitus: Secondary | ICD-10-CM

## 2022-01-31 DIAGNOSIS — Z86718 Personal history of other venous thrombosis and embolism: Secondary | ICD-10-CM

## 2022-01-31 DIAGNOSIS — E785 Hyperlipidemia, unspecified: Secondary | ICD-10-CM | POA: Diagnosis present

## 2022-01-31 DIAGNOSIS — I10 Essential (primary) hypertension: Secondary | ICD-10-CM | POA: Diagnosis present

## 2022-01-31 DIAGNOSIS — N1831 Chronic kidney disease, stage 3a: Secondary | ICD-10-CM | POA: Diagnosis present

## 2022-01-31 DIAGNOSIS — Z87891 Personal history of nicotine dependence: Secondary | ICD-10-CM

## 2022-01-31 DIAGNOSIS — N189 Chronic kidney disease, unspecified: Secondary | ICD-10-CM | POA: Diagnosis present

## 2022-01-31 DIAGNOSIS — E1122 Type 2 diabetes mellitus with diabetic chronic kidney disease: Secondary | ICD-10-CM | POA: Diagnosis present

## 2022-01-31 DIAGNOSIS — M109 Gout, unspecified: Secondary | ICD-10-CM | POA: Diagnosis present

## 2022-01-31 DIAGNOSIS — E119 Type 2 diabetes mellitus without complications: Secondary | ICD-10-CM

## 2022-01-31 DIAGNOSIS — E1169 Type 2 diabetes mellitus with other specified complication: Secondary | ICD-10-CM

## 2022-01-31 DIAGNOSIS — Z91030 Bee allergy status: Secondary | ICD-10-CM

## 2022-01-31 DIAGNOSIS — Z79890 Hormone replacement therapy (postmenopausal): Secondary | ICD-10-CM

## 2022-01-31 DIAGNOSIS — R110 Nausea: Secondary | ICD-10-CM | POA: Diagnosis not present

## 2022-01-31 DIAGNOSIS — F418 Other specified anxiety disorders: Secondary | ICD-10-CM | POA: Diagnosis present

## 2022-01-31 DIAGNOSIS — R0789 Other chest pain: Secondary | ICD-10-CM | POA: Diagnosis not present

## 2022-01-31 DIAGNOSIS — Z85850 Personal history of malignant neoplasm of thyroid: Secondary | ICD-10-CM

## 2022-01-31 LAB — COMPREHENSIVE METABOLIC PANEL
ALT: 22 U/L (ref 0–44)
AST: 23 U/L (ref 15–41)
Albumin: 3.5 g/dL (ref 3.5–5.0)
Alkaline Phosphatase: 45 U/L (ref 38–126)
Anion gap: 13 (ref 5–15)
BUN: 48 mg/dL — ABNORMAL HIGH (ref 8–23)
CO2: 26 mmol/L (ref 22–32)
Calcium: 8.1 mg/dL — ABNORMAL LOW (ref 8.9–10.3)
Chloride: 96 mmol/L — ABNORMAL LOW (ref 98–111)
Creatinine, Ser: 3.04 mg/dL — ABNORMAL HIGH (ref 0.44–1.00)
GFR, Estimated: 17 mL/min — ABNORMAL LOW (ref >60)
Glucose, Bld: 109 mg/dL — ABNORMAL HIGH (ref 70–99)
Potassium: 5.4 mmol/L — ABNORMAL HIGH (ref 3.5–5.1)
Sodium: 135 mmol/L (ref 135–145)
Total Bilirubin: 0.3 mg/dL (ref 0.3–1.2)
Total Protein: 6.5 g/dL (ref 6.5–8.1)

## 2022-01-31 LAB — CBC WITH DIFFERENTIAL/PLATELET
Abs Immature Granulocytes: 0.11 10*3/uL — ABNORMAL HIGH (ref 0.00–0.07)
Basophils Absolute: 0.1 10*3/uL (ref 0.0–0.1)
Basophils Relative: 0 %
Eosinophils Absolute: 0.4 10*3/uL (ref 0.0–0.5)
Eosinophils Relative: 3 %
HCT: 30.2 % — ABNORMAL LOW (ref 36.0–46.0)
Hemoglobin: 9.6 g/dL — ABNORMAL LOW (ref 12.0–15.0)
Immature Granulocytes: 1 %
Lymphocytes Relative: 15 %
Lymphs Abs: 2.5 10*3/uL (ref 0.7–4.0)
MCH: 31.6 pg (ref 26.0–34.0)
MCHC: 31.8 g/dL (ref 30.0–36.0)
MCV: 99.3 fL (ref 80.0–100.0)
Monocytes Absolute: 0.8 10*3/uL (ref 0.1–1.0)
Monocytes Relative: 5 %
Neutro Abs: 12.7 10*3/uL — ABNORMAL HIGH (ref 1.7–7.7)
Neutrophils Relative %: 76 %
Platelets: 308 10*3/uL (ref 150–400)
RBC: 3.04 MIL/uL — ABNORMAL LOW (ref 3.87–5.11)
RDW: 14.5 % (ref 11.5–15.5)
WBC: 16.6 10*3/uL — ABNORMAL HIGH (ref 4.0–10.5)
nRBC: 0 % (ref 0.0–0.2)

## 2022-01-31 LAB — TROPONIN I (HIGH SENSITIVITY): Troponin I (High Sensitivity): 3 ng/L (ref <18)

## 2022-01-31 NOTE — ED Triage Notes (Signed)
PER EMS: pt is from home with c/o fever, chills, sore throat and nausea onset 2 days ago.   BP - 96/54, HR-92, O2-98% 3L [place] which is baseline

## 2022-01-31 NOTE — ED Notes (Signed)
Pt now reporting chest pain and SOB

## 2022-01-31 NOTE — ED Provider Triage Note (Signed)
Emergency Medicine Provider Triage Evaluation Note  Ednamae Schiano , a 61 y.o. female  was evaluated in triage.  Pt complains of fever, chills, sore throat, nausea x2 days.  She also admits to chest pain.  Patient on chronic 3 L nasal cannula.  Review of Systems  Positive: CP Negative: SOB  Physical Exam  BP (!) 104/59 (BP Location: Right Arm)   Pulse 88   Temp 99 F (37.2 C) (Oral)   Resp 18   Ht '5\' 5"'$  (1.651 m)   Wt 136.1 kg   SpO2 100%   BMI 49.92 kg/m  Gen:   Awake, no distress   Resp:  Normal effort  MSK:   Moves extremities without difficulty  Other:    Medical Decision Making  Medically screening exam initiated at 9:55 PM.  Appropriate orders placed.  Reese Senk was informed that the remainder of the evaluation will be completed by another provider, this initial triage assessment does not replace that evaluation, and the importance of remaining in the ED until their evaluation is complete.  Labs COVID CXR EKG   Karie Kirks 01/31/22 2157

## 2022-02-01 ENCOUNTER — Emergency Department (HOSPITAL_COMMUNITY): Payer: Medicaid Other

## 2022-02-01 ENCOUNTER — Encounter (HOSPITAL_COMMUNITY): Payer: Self-pay | Admitting: Internal Medicine

## 2022-02-01 DIAGNOSIS — J9811 Atelectasis: Secondary | ICD-10-CM | POA: Diagnosis not present

## 2022-02-01 DIAGNOSIS — E1169 Type 2 diabetes mellitus with other specified complication: Secondary | ICD-10-CM

## 2022-02-01 DIAGNOSIS — I5032 Chronic diastolic (congestive) heart failure: Secondary | ICD-10-CM | POA: Diagnosis not present

## 2022-02-01 DIAGNOSIS — R509 Fever, unspecified: Secondary | ICD-10-CM | POA: Diagnosis not present

## 2022-02-01 DIAGNOSIS — E785 Hyperlipidemia, unspecified: Secondary | ICD-10-CM

## 2022-02-01 DIAGNOSIS — E119 Type 2 diabetes mellitus without complications: Secondary | ICD-10-CM | POA: Diagnosis not present

## 2022-02-01 DIAGNOSIS — I130 Hypertensive heart and chronic kidney disease with heart failure and stage 1 through stage 4 chronic kidney disease, or unspecified chronic kidney disease: Secondary | ICD-10-CM | POA: Diagnosis not present

## 2022-02-01 DIAGNOSIS — E039 Hypothyroidism, unspecified: Secondary | ICD-10-CM | POA: Diagnosis not present

## 2022-02-01 DIAGNOSIS — F419 Anxiety disorder, unspecified: Secondary | ICD-10-CM

## 2022-02-01 DIAGNOSIS — Z87891 Personal history of nicotine dependence: Secondary | ICD-10-CM | POA: Diagnosis not present

## 2022-02-01 DIAGNOSIS — M109 Gout, unspecified: Secondary | ICD-10-CM | POA: Diagnosis present

## 2022-02-01 DIAGNOSIS — F32A Depression, unspecified: Secondary | ICD-10-CM

## 2022-02-01 DIAGNOSIS — R079 Chest pain, unspecified: Secondary | ICD-10-CM | POA: Diagnosis not present

## 2022-02-01 DIAGNOSIS — Z886 Allergy status to analgesic agent status: Secondary | ICD-10-CM | POA: Diagnosis not present

## 2022-02-01 DIAGNOSIS — E860 Dehydration: Secondary | ICD-10-CM | POA: Diagnosis present

## 2022-02-01 DIAGNOSIS — N178 Other acute kidney failure: Secondary | ICD-10-CM

## 2022-02-01 DIAGNOSIS — N1831 Chronic kidney disease, stage 3a: Secondary | ICD-10-CM | POA: Diagnosis not present

## 2022-02-01 DIAGNOSIS — Z1152 Encounter for screening for COVID-19: Secondary | ICD-10-CM | POA: Diagnosis not present

## 2022-02-01 DIAGNOSIS — Z6841 Body Mass Index (BMI) 40.0 and over, adult: Secondary | ICD-10-CM | POA: Diagnosis not present

## 2022-02-01 DIAGNOSIS — I10 Essential (primary) hypertension: Secondary | ICD-10-CM | POA: Diagnosis not present

## 2022-02-01 DIAGNOSIS — E861 Hypovolemia: Secondary | ICD-10-CM | POA: Diagnosis present

## 2022-02-01 DIAGNOSIS — N179 Acute kidney failure, unspecified: Secondary | ICD-10-CM | POA: Diagnosis not present

## 2022-02-01 DIAGNOSIS — E890 Postprocedural hypothyroidism: Secondary | ICD-10-CM | POA: Diagnosis not present

## 2022-02-01 DIAGNOSIS — G8929 Other chronic pain: Secondary | ICD-10-CM | POA: Diagnosis present

## 2022-02-01 DIAGNOSIS — I959 Hypotension, unspecified: Secondary | ICD-10-CM | POA: Diagnosis not present

## 2022-02-01 DIAGNOSIS — G894 Chronic pain syndrome: Secondary | ICD-10-CM

## 2022-02-01 DIAGNOSIS — K5900 Constipation, unspecified: Secondary | ICD-10-CM | POA: Diagnosis present

## 2022-02-01 DIAGNOSIS — I700 Atherosclerosis of aorta: Secondary | ICD-10-CM | POA: Diagnosis not present

## 2022-02-01 DIAGNOSIS — R109 Unspecified abdominal pain: Secondary | ICD-10-CM | POA: Diagnosis not present

## 2022-02-01 DIAGNOSIS — R0789 Other chest pain: Secondary | ICD-10-CM | POA: Diagnosis not present

## 2022-02-01 DIAGNOSIS — E1122 Type 2 diabetes mellitus with diabetic chronic kidney disease: Secondary | ICD-10-CM | POA: Diagnosis not present

## 2022-02-01 DIAGNOSIS — R1084 Generalized abdominal pain: Secondary | ICD-10-CM | POA: Diagnosis not present

## 2022-02-01 DIAGNOSIS — R110 Nausea: Secondary | ICD-10-CM | POA: Diagnosis not present

## 2022-02-01 DIAGNOSIS — Z808 Family history of malignant neoplasm of other organs or systems: Secondary | ICD-10-CM | POA: Diagnosis not present

## 2022-02-01 DIAGNOSIS — J4489 Other specified chronic obstructive pulmonary disease: Secondary | ICD-10-CM | POA: Diagnosis present

## 2022-02-01 LAB — LIPASE, BLOOD: Lipase: 28 U/L (ref 11–51)

## 2022-02-01 LAB — RESP PANEL BY RT-PCR (FLU A&B, COVID) ARPGX2
Influenza A by PCR: NEGATIVE
Influenza B by PCR: NEGATIVE
SARS Coronavirus 2 by RT PCR: NEGATIVE

## 2022-02-01 LAB — GLUCOSE, CAPILLARY: Glucose-Capillary: 140 mg/dL — ABNORMAL HIGH (ref 70–99)

## 2022-02-01 LAB — CBG MONITORING, ED: Glucose-Capillary: 98 mg/dL (ref 70–99)

## 2022-02-01 LAB — HIV ANTIBODY (ROUTINE TESTING W REFLEX): HIV Screen 4th Generation wRfx: NONREACTIVE

## 2022-02-01 LAB — HEMOGLOBIN A1C
Hgb A1c MFr Bld: 6.1 % — ABNORMAL HIGH (ref 4.8–5.6)
Mean Plasma Glucose: 128.37 mg/dL

## 2022-02-01 MED ORDER — ROSUVASTATIN CALCIUM 5 MG PO TABS
10.0000 mg | ORAL_TABLET | Freq: Every evening | ORAL | Status: DC
  Administered 2022-02-01 – 2022-02-02 (×2): 10 mg via ORAL
  Filled 2022-02-01 (×2): qty 2

## 2022-02-01 MED ORDER — LACTATED RINGERS IV SOLN: INTRAVENOUS | Status: DC

## 2022-02-01 MED ORDER — SODIUM CHLORIDE 0.9% FLUSH
3.0000 mL | Freq: Two times a day (BID) | INTRAVENOUS | Status: DC
  Administered 2022-02-01 – 2022-02-02 (×3): 3 mL via INTRAVENOUS

## 2022-02-01 MED ORDER — OXYCODONE-ACETAMINOPHEN 5-325 MG PO TABS
1.0000 | ORAL_TABLET | Freq: Four times a day (QID) | ORAL | Status: DC | PRN
  Administered 2022-02-02 – 2022-02-03 (×2): 1 via ORAL
  Filled 2022-02-01 (×2): qty 1

## 2022-02-01 MED ORDER — IPRATROPIUM-ALBUTEROL 0.5-2.5 (3) MG/3ML IN SOLN
3.0000 mL | Freq: Three times a day (TID) | RESPIRATORY_TRACT | Status: DC
  Administered 2022-02-01 – 2022-02-03 (×4): 3 mL via RESPIRATORY_TRACT
  Filled 2022-02-01 (×4): qty 3

## 2022-02-01 MED ORDER — BUSPIRONE HCL 5 MG PO TABS: 5.0000 mg | ORAL_TABLET | Freq: Three times a day (TID) | ORAL | Status: DC | PRN

## 2022-02-01 MED ORDER — INSULIN ASPART 100 UNIT/ML IJ SOLN: 0.0000 [IU] | Freq: Three times a day (TID) | INTRAMUSCULAR | Status: DC

## 2022-02-01 MED ORDER — ENOXAPARIN SODIUM 30 MG/0.3ML IJ SOSY
30.0000 mg | PREFILLED_SYRINGE | INTRAMUSCULAR | Status: DC
  Administered 2022-02-02: 30 mg via SUBCUTANEOUS
  Filled 2022-02-01: qty 0.3

## 2022-02-01 MED ORDER — HYDRALAZINE HCL 20 MG/ML IJ SOLN: 5.0000 mg | INTRAMUSCULAR | Status: DC | PRN

## 2022-02-01 MED ORDER — LACTATED RINGERS IV BOLUS
1000.0000 mL | Freq: Once | INTRAVENOUS | Status: AC
  Administered 2022-02-01: 1000 mL via INTRAVENOUS

## 2022-02-01 MED ORDER — LEVOCETIRIZINE DIHYDROCHLORIDE 5 MG PO TABS: 5.0000 mg | ORAL_TABLET | Freq: Every day | ORAL | Status: DC

## 2022-02-01 MED ORDER — UMECLIDINIUM-VILANTEROL 62.5-25 MCG/ACT IN AEPB
1.0000 | INHALATION_SPRAY | Freq: Every day | RESPIRATORY_TRACT | Status: DC
  Administered 2022-02-02 – 2022-02-03 (×2): 1 via RESPIRATORY_TRACT
  Filled 2022-02-01: qty 14

## 2022-02-01 MED ORDER — TRAZODONE HCL 50 MG PO TABS
100.0000 mg | ORAL_TABLET | Freq: Every day | ORAL | Status: DC
  Administered 2022-02-01 – 2022-02-02 (×2): 100 mg via ORAL
  Filled 2022-02-01 (×2): qty 2

## 2022-02-01 MED ORDER — INSULIN ASPART 100 UNIT/ML IJ SOLN: 0.0000 [IU] | Freq: Every day | INTRAMUSCULAR | Status: DC

## 2022-02-01 MED ORDER — BISACODYL 5 MG PO TBEC: 5.0000 mg | DELAYED_RELEASE_TABLET | Freq: Every day | ORAL | Status: DC | PRN

## 2022-02-01 MED ORDER — ACETAMINOPHEN 650 MG RE SUPP: 650.0000 mg | Freq: Four times a day (QID) | RECTAL | Status: DC | PRN

## 2022-02-01 MED ORDER — OXYCODONE HCL 5 MG PO TABS
5.0000 mg | ORAL_TABLET | Freq: Four times a day (QID) | ORAL | Status: DC | PRN
  Administered 2022-02-02 – 2022-02-03 (×3): 5 mg via ORAL
  Filled 2022-02-01 (×3): qty 1

## 2022-02-01 MED ORDER — ALLOPURINOL 100 MG PO TABS
100.0000 mg | ORAL_TABLET | Freq: Every day | ORAL | Status: DC
  Administered 2022-02-02 – 2022-02-03 (×2): 100 mg via ORAL
  Filled 2022-02-01 (×2): qty 1

## 2022-02-01 MED ORDER — LORATADINE 10 MG PO TABS
10.0000 mg | ORAL_TABLET | Freq: Every day | ORAL | Status: DC
  Administered 2022-02-01 – 2022-02-02 (×2): 10 mg via ORAL
  Filled 2022-02-01 (×2): qty 1

## 2022-02-01 MED ORDER — ALBUTEROL SULFATE (2.5 MG/3ML) 0.083% IN NEBU: 2.5000 mg | INHALATION_SOLUTION | Freq: Four times a day (QID) | RESPIRATORY_TRACT | Status: DC | PRN

## 2022-02-01 MED ORDER — LORATADINE 10 MG PO TABS: 10.0000 mg | ORAL_TABLET | Freq: Every day | ORAL | Status: DC

## 2022-02-01 MED ORDER — ONDANSETRON HCL 4 MG/2ML IJ SOLN: 4.0000 mg | Freq: Four times a day (QID) | INTRAMUSCULAR | Status: DC | PRN

## 2022-02-01 MED ORDER — POLYETHYLENE GLYCOL 3350 17 G PO PACK: 17.0000 g | PACK | Freq: Every day | ORAL | Status: DC | PRN

## 2022-02-01 MED ORDER — CALCITRIOL 0.5 MCG PO CAPS
0.5000 ug | ORAL_CAPSULE | Freq: Every day | ORAL | Status: DC
  Administered 2022-02-01 – 2022-02-03 (×3): 0.5 ug via ORAL
  Filled 2022-02-01 (×4): qty 1

## 2022-02-01 MED ORDER — LEVOTHYROXINE SODIUM 112 MCG PO TABS
224.0000 ug | ORAL_TABLET | Freq: Every day | ORAL | Status: DC
  Administered 2022-02-02 – 2022-02-03 (×2): 224 ug via ORAL
  Filled 2022-02-01 (×2): qty 2

## 2022-02-01 MED ORDER — ACETAMINOPHEN 325 MG PO TABS: 650.0000 mg | ORAL_TABLET | Freq: Four times a day (QID) | ORAL | Status: DC | PRN

## 2022-02-01 MED ORDER — CYCLOBENZAPRINE HCL 10 MG PO TABS: 10.0000 mg | ORAL_TABLET | Freq: Three times a day (TID) | ORAL | Status: DC | PRN

## 2022-02-01 MED ORDER — VALACYCLOVIR HCL 500 MG PO TABS
1000.0000 mg | ORAL_TABLET | Freq: Every day | ORAL | Status: DC
  Administered 2022-02-02 – 2022-02-03 (×2): 1000 mg via ORAL
  Filled 2022-02-01 (×2): qty 2

## 2022-02-01 MED ORDER — ASPIRIN 81 MG PO TBEC
81.0000 mg | DELAYED_RELEASE_TABLET | Freq: Every day | ORAL | Status: DC
  Administered 2022-02-02 – 2022-02-03 (×2): 81 mg via ORAL
  Filled 2022-02-01 (×2): qty 1

## 2022-02-01 MED ORDER — OXYCODONE-ACETAMINOPHEN 10-325 MG PO TABS: 1.0000 | ORAL_TABLET | Freq: Four times a day (QID) | ORAL | Status: DC | PRN

## 2022-02-01 MED ORDER — CLONAZEPAM 0.5 MG PO TABS
2.0000 mg | ORAL_TABLET | Freq: Three times a day (TID) | ORAL | Status: DC | PRN
  Administered 2022-02-02 – 2022-02-03 (×2): 2 mg via ORAL
  Filled 2022-02-01 (×2): qty 4

## 2022-02-01 MED ORDER — ONDANSETRON HCL 4 MG PO TABS: 4.0000 mg | ORAL_TABLET | Freq: Four times a day (QID) | ORAL | Status: DC | PRN

## 2022-02-01 NOTE — Progress Notes (Signed)
Patient has home medications at the bedside in her pocket book. She refused to send to pharmacy. MD notified.

## 2022-02-01 NOTE — ED Notes (Signed)
Contacted main lab and was advised that they could add on the current lab ordered by provider

## 2022-02-01 NOTE — ED Notes (Signed)
ED provider at bedside at this time 

## 2022-02-01 NOTE — ED Provider Notes (Signed)
[facility] [place] EMERGENCY DEPARTMENT Provider Note   CSN: [id number] Arrival date & time: 01/31/22  2146     History  Chief Complaint  Patient presents with   Cough   Chest Pain    Michele Owens is a 61 y.o. female.  Patient presents to the emergency department for evaluation of multiple problems.  Patient reports that she has not been feeling well for some days.  She has had nausea and vomiting, was prescribed an antiemetic by her doctor but the pharmacy did not have any.  Patient has been experiencing cough and congestion, feels more short of breath than usual.  She is normally on oxygen continuously at home.  She has been having some pain on the right side of her chest intermittently.  Patient reports diffuse abdominal pain.       Home Medications Prior to Admission medications   Medication Sig Start Date End Date Taking? Authorizing Provider  acetaminophen (TYLENOL) 500 MG tablet Take 1,000 mg by mouth every 6 (six) hours as needed for moderate pain or headache.    [provider]  albuterol (PROVENTIL HFA;VENTOLIN HFA) 108 (90 Base) MCG/ACT inhaler Inhale 1-2 puffs into the lungs every 6 (six) hours as needed for wheezing or shortness of breath. 04/23/18   Long, [name], MD  allopurinol (ZYLOPRIM) 100 MG tablet Take 100 mg by mouth daily.    [provider]  aspirin EC 81 MG tablet Take 81 mg by mouth daily. Swallow whole.    [provider]  benzonatate (TESSALON) 200 MG capsule Take 1 capsule (200 mg total) by mouth in the morning, at noon, and at bedtime. 7 DS Patient taking differently: Take 200 mg by mouth 3 (three) times daily as needed for cough. 04/13/20   Regalado, Belkys A, MD  blood glucose meter kit and supplies KIT Dispense based on patient and insurance preference. Use up to four times daily as directed. 12/25/20   Little Ishikawa, MD  busPIRone (BUSPAR) 5 MG tablet Take 5 mg by mouth 3 (three) times daily as needed  (anxiety).    [provider]  Butalbital-APAP-Caffeine 50-300-40 MG CAPS Take 1 capsule by mouth 2 (two) times daily as needed for headache.    [provider]  calcitRIOL (ROCALTROL) 0.5 MCG capsule Take 1 capsule (0.5 mcg total) by mouth daily. 09/26/16   Hongalgi, [name], MD  carvedilol (COREG) 6.25 MG tablet Take 1 tablet (6.25 mg total) by mouth 2 (two) times daily with a meal. 01/23/18   Bensimhon, [name], MD  cefdinir (OMNICEF) 300 MG capsule Take 1 capsule (300 mg total) by mouth 2 (two) times daily. 08/30/21   Quintella Reichert, MD  clonazePAM (KLONOPIN) 2 MG tablet Take 1 tablet (2 mg total) by mouth 3 (three) times daily. for anxiety Patient taking differently: Take 2 mg by mouth 3 (three) times daily. 06/23/19   [name], MD  cyclobenzaprine (FLEXERIL) 10 MG tablet Take 10 mg by mouth 3 (three) times daily. 11/19/19   [provider]  diclofenac sodium (VOLTAREN) 1 % GEL Apply 4 g topically 4 (four) times daily as needed (for pain). 04/27/21   Varney Biles, MD  dicyclomine (BENTYL) 20 MG tablet Take 1 tablet (20 mg total) by mouth 2 (two) times daily as needed for spasms (abdominal cramping). 06/10/21   Mesner, Corene Cornea, MD  doxycycline (VIBRAMYCIN) 100 MG capsule Take 1 capsule (100 mg total) by mouth 2 (two) times daily. One po bid x 7  days Patient not taking: Reported on 06/27/2021 03/16/21   Deno Etienne, DO  doxycycline (VIBRAMYCIN) 100 MG capsule Take 1 capsule (100 mg total) by mouth 2 (two) times daily. 08/30/21   Quintella Reichert, MD  EPINEPHrine 0.3 mg/0.3 mL IJ SOAJ injection Inject 0.3 mg into the muscle once as needed for anaphylaxis.    [provider]  fenofibrate (TRICOR) 145 MG tablet Take 145 mg by mouth daily. 06/07/21   [provider]  FEROSUL 325 (65 Fe) MG tablet Take 325 mg by mouth daily. 03/22/17   [provider]  glucose blood test strip Use as instructed to check blood sugar up to 4 times daily Patient taking  differently: 1 each by Other route See admin instructions. Use as instructed to check blood sugar up to 4 times daily 08/06/17   [name], DO  guaiFENesin (DIABETIC SILTUSSIN DAS-NA PO) Take 5 mLs by mouth at bedtime as needed (cough).    [provider]  ipratropium-albuterol (DUONEB) 0.5-2.5 (3) MG/3ML SOLN Take 3 mLs by nebulization 3 (three) times daily. 04/05/17   [provider]  levothyroxine (SYNTHROID, LEVOTHROID) 112 MCG tablet Take 224 mcg by mouth daily before breakfast. 07/11/17   [provider]  lisinopril (PRINIVIL,ZESTRIL) 5 MG tablet Take 1 tablet (5 mg total) by mouth daily. Please call for office visit [phone] Patient taking differently: Take 5 mg by mouth daily. 08/15/17   [name], MD  loperamide (IMODIUM) 2 MG capsule Take 2 mg by mouth 4 (four) times daily as needed. 05/09/21   [provider]  loratadine (CLARITIN) 10 MG tablet Take 10 mg by mouth daily.    [provider]  metFORMIN (GLUCOPHAGE) 500 MG tablet Take 1 tablet (500 mg total) by mouth 2 (two) times daily with a meal. 12/11/14   [name], MD  nitroGLYCERIN (NITROSTAT) 0.4 MG SL tablet Place 1 tablet (0.4 mg total) under the tongue every 5 (five) minutes as needed for chest pain. 06/19/18   [name], MD  omeprazole (PRILOSEC) 20 MG capsule Take 20 mg by mouth 2 (two) times daily. 05/19/21   [provider]  ondansetron (ZOFRAN-ODT) 4 MG disintegrating tablet 49m ODT q4 hours prn nausea/vomit Patient taking differently: Take 4 mg by mouth every 8 (eight) hours as needed for vomiting or nausea. 03/09/21   YDrenda Freeze MD  oxyCODONE-acetaminophen (PERCOCET) 10-325 MG tablet Take 1 tablet by mouth every 6 (six) hours as needed for pain. 12/03/19   [provider]  OXYGEN Inhale 4 L into the lungs continuous.    [provider]  phentermine (ADIPEX-P) 37.5 MG tablet Take 37.5 mg by mouth daily. 05/25/21   [provider]  potassium chloride SA (K-DUR,KLOR-CON) 20 MEQ tablet Take 1 tablet (20 mEq total) by mouth 2 (two) times daily. 07/24/18   MLarey Dresser MD  rosuvastatin (CRESTOR) 10 MG tablet Take 10 mg by mouth every evening. 11/19/19   [provider]  senna-docusate (SENOKOT-S) 8.6-50 MG tablet Take 1 tablet by mouth daily.    [provider]  torsemide (DEMADEX) 20 MG tablet TAKE 2 TABLETS (40 MG TOTAL) BY MOUTH 2 (TWO) TIMES DAILY. 07/05/21   MLarey Dresser MD  traZODone (DESYREL) 50 MG tablet Take 100 mg by mouth at bedtime. 11/19/19   [provider]  triamcinolone cream (KENALOG) 0.1 % Apply 1 application. topically 2 (two) times daily as needed (irrritation). 09/10/20   [provider]  umeclidinium-vilanterol (  ANORO ELLIPTA) 62.5-25 MCG/INH AEPB Inhale 1 puff into the lungs daily.    [provider]      Allergies    Bee venom, Fioricet [butalbital-apap-caffeine], Ibuprofen, Lamisil [terbinafine], and Nsaids    Review of Systems   Review of Systems  Physical Exam Updated Vital Signs BP (!) 83/52   Pulse 80   Temp 98.1 F (36.7 C) (Oral)   Resp 15   Ht 5' 5" (1.651 m)   Wt 136.1 kg   SpO2 98%   BMI 49.92 kg/m  Physical Exam Vitals and nursing note reviewed.  Constitutional:      General: She is not in acute distress.    Appearance: She is well-developed. She is obese.  HENT:     Head: Normocephalic and atraumatic.     Mouth/Throat:     Mouth: Mucous membranes are moist.  Eyes:     General: Vision grossly intact. Gaze aligned appropriately.     Extraocular Movements: Extraocular movements intact.     Conjunctiva/sclera: Conjunctivae normal.  Cardiovascular:     Rate and Rhythm: Normal rate and regular rhythm.     Pulses: Normal pulses.     Heart sounds: Normal heart sounds, S1 normal and S2 normal. No murmur heard.    No friction rub. No gallop.  Pulmonary:     Effort: Pulmonary effort is normal. No respiratory  distress.     Breath sounds: Decreased breath sounds present.  Abdominal:     General: Bowel sounds are normal.     Palpations: Abdomen is soft.     Tenderness: There is generalized abdominal tenderness. There is no guarding or rebound.     Hernia: No hernia is present.  Musculoskeletal:        General: No swelling.     Cervical back: Full passive range of motion without pain, normal range of motion and neck supple. No spinous process tenderness or muscular tenderness. Normal range of motion.     Right lower leg: No edema.     Left lower leg: No edema.  Skin:    General: Skin is warm and dry.     Capillary Refill: Capillary refill takes less than 2 seconds.     Findings: No ecchymosis, erythema, rash or wound.  Neurological:     General: No focal deficit present.     Mental Status: She is alert and oriented to person, place, and time.     GCS: GCS eye subscore is 4. GCS verbal subscore is 5. GCS motor subscore is 6.     Cranial Nerves: Cranial nerves 2-12 are intact.     Sensory: Sensation is intact.     Motor: Motor function is intact.     Coordination: Coordination is intact.  Psychiatric:        Attention and Perception: Attention normal.        Mood and Affect: Mood normal.        Speech: Speech normal.        Behavior: Behavior normal.     ED Results / Procedures / Treatments   Labs (all labs ordered are listed, but only abnormal results are displayed) Labs Reviewed  CBC WITH DIFFERENTIAL/PLATELET - Abnormal; Notable for the following components:      Result Value   WBC 16.6 (*)    RBC 3.04 (*)    Hemoglobin 9.6 (*)    HCT 30.2 (*)    Neutro Abs 12.7 (*)    Abs Immature Granulocytes 0.11 (*)  All other components within normal limits  COMPREHENSIVE METABOLIC PANEL - Abnormal; Notable for the following components:   Potassium 5.4 (*)    Chloride 96 (*)    Glucose, Bld 109 (*)    BUN 48 (*)    Creatinine, Ser 3.04 (*)    Calcium 8.1 (*)    GFR, Estimated 17  (*)    All other components within normal limits  RESP PANEL BY RT-PCR (FLU A&B, COVID) ARPGX2  LIPASE, BLOOD  TROPONIN I (HIGH SENSITIVITY)  TROPONIN I (HIGH SENSITIVITY)    EKG EKG Interpretation  Date/Time:  Wednesday February 01 2022 03:56:17 EDT Ventricular Rate:  83 PR Interval:  166 QRS Duration: 87 QT Interval:  368 QTC Calculation: 433 R Axis:   43 Text Interpretation: Sinus rhythm Ventricular premature complex Low voltage, precordial leads Confirmed by [name] [phone]) on 02/01/2022 4:42:14 AM  Radiology DG Chest 1 View  Result Date: 01/31/2022 CLINICAL DATA:  Chest pain. Fever, chills, sore throat, and nausea for 2 days. EXAM: CHEST  1 VIEW COMPARISON:  11/11/2021 FINDINGS: Shallow inspiration. Cardiac enlargement. Prominent central pulmonary vascularity with perihilar and basilar interstitial infiltration. This may represent edema or multifocal pneumonia. Linear atelectasis in the lung bases. No pleural effusions. No pneumothorax. IMPRESSION: Cardiac enlargement with mild vascular congestion and perihilar/basilar infiltration. Linear atelectasis in the lung bases. Electronically Signed   By: Lucienne Capers M.D.   On: 01/31/2022 22:35    Procedures Procedures    Medications Ordered in ED Medications  lactated ringers bolus 1,000 mL (1,000 mLs Intravenous New Bag/Given 02/01/22 0604)    ED Course/ Medical Decision Making/ A&P                           Medical Decision Making Amount and/or Complexity of Data Reviewed External Data Reviewed: labs, ECG and notes. Radiology: ordered and independent interpretation performed. Decision-making details documented in ED Course. ECG/medicine tests: ordered and independent interpretation performed. Decision-making details documented in ED Course.   Patient presents to the emergency department with multiple symptoms.  Patient reports that she has been feeling poorly for couple of days.  Patient has had some  cough and congestion.  She does report a history of asthma.  In addition to this she has developed nausea and vomiting for 2 days.  She has been having some difficulty holding down liquids.  She was prescribed an antiemetic but her pharmacy could not get it so she has been untreated.  Patient does report diffuse abdominal pain.  Frontal diagnosis is quite broad as she has multiple complaints, includes but not limited to, small bowel obstruction, gastroenteritis, viral URI including COVID and influenza, ACS, asthma exacerbation.  Patient underwent chest x-ray that does not show acute pathology.  She is not hypoxic, but does seem to have mild tachypnea and somewhat increased work of breathing.  This may be her baseline. COVID and influenza are negative.  EKG was unremarkable.  Troponins negative.  Abdominal labs are unrevealing.  Normal lipase and LFTs.  She does have a leukocytosis, white blood cell count of 16.6.  Additionally she has evidence of an acute kidney injury, likely some element of dehydration.  Patient administered 2 L of lactated Ringer's during work-up here.  Patient to undergo CT abdomen and pelvis to further evaluate her diffuse abdominal pain and generalized tenderness without peritonitis. Scan pending at this time, will sign out to on-coming ER physician.  Final Clinical Impression(s) / ED Diagnoses Final diagnoses:  Generalized abdominal pain  AKI (acute kidney injury) [facility])    Rx / DC Orders ED Discharge Orders     None         , Gwenyth Allegra, MD 02/01/22 [phone]

## 2022-02-01 NOTE — ED Notes (Signed)
Pt placed on bedpan at this time.

## 2022-02-01 NOTE — ED Provider Notes (Signed)
  Physical Exam  BP 99/61   Pulse 79   Temp 98.1 F (36.7 C) (Oral)   Resp 14   Ht '5\' 5"'$  (1.651 m)   Wt 136.1 kg   SpO2 99%   BMI 49.92 kg/m   Physical Exam  Procedures  Procedures  ED Course / MDM    Medical Decision Making Amount and/or Complexity of Data Reviewed Radiology: ordered.   Received patient signout.  Acute kidney injury.  Hypotension.  Likely due to decreased oral intake.  Reportedly has had difficulty keeping down liquids.  CT scan reassuring.  Does have acute kidney injury which require admission to the hospital.  Will discuss with hospitalist.  Has had had hypotension that has improved with some fluids.       Davonna Belling, MD 02/01/22 [phone]

## 2022-02-01 NOTE — ED Notes (Signed)
Patient left the floor in stable condition with her belongings including her cane and rolling walker, in stable condition, AOX4.

## 2022-02-01 NOTE — ED Notes (Signed)
Oxygen tank replaced °

## 2022-02-01 NOTE — H&P (Signed)
History and Physical    PatientAnieya Owens WIO:[id number] DOB: 06-17-1960 DOA: 01/31/2022 DOS: the patient was seen and examined on 02/01/2022 PCP: [name], MD  Patient coming from: Home - lives alone; NOK: Daughter, Michele Owens, [phone]   Chief Complaint: Fever  HPI: Michele Owens is a 61 y.o. female with medical history significant of chronic diastolic CHF; DM; COPD on 3L home O2; HTN; and depression presenting with fever, nausea, sore throat.  She reports that she "don't know really" why she is here.  She started having back pain about 2 weeks ago, fell at the nail shop and it "tore my back up".   She was seen for this on 9/22.  She has been walking, trying to diet.  She came in because she was hurting in her back and could hardly walk due to pain.  She had a fever to "I don't know but it was high", none in the ER  No vomiting but she feels nauseated.  She has been keeping fluids down.  She has been taking meds for back pain - oxycodone, "allergic to ibuprofen."  She began looking in her purse to show me her oxycodone bottle, was quite somnolent.    ER Course:  AKI from poor PO intake, n/v.  Creatinine 3, previously 1.  BP low, improved with IVF.  Abdominal CT unremarkable.     Review of Systems: As mentioned in the history of present illness. All other systems reviewed and are negative. Past Medical History:  Diagnosis Date   Anxiety    Arthritis    Asthma    Chronic diastolic CHF (congestive heart failure) (HCC)    COPD (chronic obstructive pulmonary disease) (HCC)    Uses Oxygen at night   Depression    Diabetes mellitus without complication (HCC)    GERD (gastroesophageal reflux disease)    Gout    Headache    migraines   History of DVT of lower extremity    Hypertension    Thyroid cancer ([place]) 09/23/2016   Past Surgical History:  Procedure Laterality Date   BUNIONECTOMY Bilateral    CARDIAC CATHETERIZATION N/A 06/23/2015   Procedure: Right/Left  Heart Cath and Coronary Angiography;  Surgeon: [name], MD;  Location: [facility];  Service: Cardiovascular;  Laterality: N/A;   COLONOSCOPY WITH PROPOFOL N/A 12/15/2015   Procedure: COLONOSCOPY WITH PROPOFOL;  Surgeon: [name], MD;  Location: [facility];  Service: Endoscopy;  Laterality: N/A;   RIGHT HEART CATH N/A 10/01/2019   Procedure: RIGHT HEART CATH;  Surgeon: [name], MD;  Location: [facility];  Service: Cardiovascular;  Laterality: N/A;   THYROIDECTOMY  09/19/2016   THYROIDECTOMY N/A 09/19/2016   Procedure: TOTAL THYROIDECTOMY;  Surgeon: [name], MD;  Location: [facility];  Service: General;  Laterality: N/A;   TONSILLECTOMY     TOTAL ABDOMINAL HYSTERECTOMY  07/14/10   Social History:  reports that she quit smoking about 5 years ago. Her smoking use included cigarettes. She has a 20.50 pack-year smoking history. She has never used smokeless tobacco. She reports that she does not currently use alcohol. She reports that she does not currently use drugs after having used the following drugs: Marijuana and Cocaine.  Allergies  Allergen Reactions   Bee Venom Swelling and Other (See Comments)    "All over my body" (swelling)   Fioricet [Butalbital-Apap-Caffeine] Nausea And Vomiting and Rash   Ibuprofen Rash and Other (See Comments)    Severe rash  Lamisil [Terbinafine] Rash and Other (See Comments)    Pt states this causes her to "feel funny"   Nsaids Other (See Comments)    Per MD's orders     Family History  Problem Relation Age of Onset   Cancer Father        thought to be due to exposure to concrete   Diabetes Mother    Thyroid cancer Mother        dx in her 5s-60s   Cancer Maternal Uncle        2 uncles with cancer NOS   Brain cancer Paternal Aunt    Cancer Cousin        maternal first cousin - NOS   Cancer Cousin        maternal first cousin - NOS    Prior to Admission medications   Medication Sig Start Date End Date Taking?  Authorizing Provider  acetaminophen (TYLENOL) 500 MG tablet Take 1,000 mg by mouth every 6 (six) hours as needed for moderate pain or headache.    [provider]  albuterol (PROVENTIL HFA;VENTOLIN HFA) 108 (90 Base) MCG/ACT inhaler Inhale 1-2 puffs into the lungs every 6 (six) hours as needed for wheezing or shortness of breath. 04/23/18   Long, [name], MD  allopurinol (ZYLOPRIM) 100 MG tablet Take 100 mg by mouth daily.    [provider]  aspirin EC 81 MG tablet Take 81 mg by mouth daily. Swallow whole.    [provider]  benzonatate (TESSALON) 200 MG capsule Take 1 capsule (200 mg total) by mouth in the morning, at noon, and at bedtime. 7 DS Patient taking differently: Take 200 mg by mouth 3 (three) times daily as needed for cough. 04/13/20   Regalado, Belkys A, MD  blood glucose meter kit and supplies KIT Dispense based on patient and insurance preference. Use up to four times daily as directed. 12/25/20   Little Ishikawa, MD  busPIRone (BUSPAR) 5 MG tablet Take 5 mg by mouth 3 (three) times daily as needed (anxiety).    [provider]  Butalbital-APAP-Caffeine 50-300-40 MG CAPS Take 1 capsule by mouth 2 (two) times daily as needed for headache.    [provider]  calcitRIOL (ROCALTROL) 0.5 MCG capsule Take 1 capsule (0.5 mcg total) by mouth daily. 09/26/16   Hongalgi, [name], MD  carvedilol (COREG) 6.25 MG tablet Take 1 tablet (6.25 mg total) by mouth 2 (two) times daily with a meal. 01/23/18   Bensimhon, [name], MD  cefdinir (OMNICEF) 300 MG capsule Take 1 capsule (300 mg total) by mouth 2 (two) times daily. 08/30/21   Quintella Reichert, MD  clonazePAM (KLONOPIN) 2 MG tablet Take 1 tablet (2 mg total) by mouth 3 (three) times daily. for anxiety Patient taking differently: Take 2 mg by mouth 3 (three) times daily. 06/23/19   [name], MD  cyclobenzaprine (FLEXERIL) 10 MG tablet Take 10 mg by mouth 3 (three) times daily. 11/19/19   [provider]  diclofenac sodium (VOLTAREN) 1 % GEL Apply 4 g topically 4 (four) times daily as needed (for pain). 04/27/21   Varney Biles, MD  dicyclomine (BENTYL) 20 MG tablet Take 1 tablet (20 mg total) by mouth 2 (two) times daily as needed for spasms (abdominal cramping). 06/10/21   Mesner, Corene Cornea, MD  doxycycline (VIBRAMYCIN) 100 MG capsule Take 1 capsule (100 mg total) by mouth 2 (two) times daily. One po bid x 7 days Patient not taking: Reported  on 06/27/2021 03/16/21   Deno Etienne, DO  doxycycline (VIBRAMYCIN) 100 MG capsule Take 1 capsule (100 mg total) by mouth 2 (two) times daily. 08/30/21   Quintella Reichert, MD  EPINEPHrine 0.3 mg/0.3 mL IJ SOAJ injection Inject 0.3 mg into the muscle once as needed for anaphylaxis.    [provider]  fenofibrate (TRICOR) 145 MG tablet Take 145 mg by mouth daily. 06/07/21   [provider]  FEROSUL 325 (65 Fe) MG tablet Take 325 mg by mouth daily. 03/22/17   [provider]  glucose blood test strip Use as instructed to check blood sugar up to 4 times daily Patient taking differently: 1 each by Other route See admin instructions. Use as instructed to check blood sugar up to 4 times daily 08/06/17   [name], DO  guaiFENesin (DIABETIC SILTUSSIN DAS-NA PO) Take 5 mLs by mouth at bedtime as needed (cough).    [provider]  ipratropium-albuterol (DUONEB) 0.5-2.5 (3) MG/3ML SOLN Take 3 mLs by nebulization 3 (three) times daily. 04/05/17   [provider]  levothyroxine (SYNTHROID, LEVOTHROID) 112 MCG tablet Take 224 mcg by mouth daily before breakfast. 07/11/17   [provider]  lisinopril (PRINIVIL,ZESTRIL) 5 MG tablet Take 1 tablet (5 mg total) by mouth daily. Please call for office visit [phone] Patient taking differently: Take 5 mg by mouth daily. 08/15/17   [name], MD  loperamide (IMODIUM) 2 MG capsule Take 2 mg by mouth 4 (four) times daily as needed. 05/09/21   [provider]   loratadine (CLARITIN) 10 MG tablet Take 10 mg by mouth daily.    [provider]  metFORMIN (GLUCOPHAGE) 500 MG tablet Take 1 tablet (500 mg total) by mouth 2 (two) times daily with a meal. 12/11/14   [name], MD  nitroGLYCERIN (NITROSTAT) 0.4 MG SL tablet Place 1 tablet (0.4 mg total) under the tongue every 5 (five) minutes as needed for chest pain. 06/19/18   [name], MD  omeprazole (PRILOSEC) 20 MG capsule Take 20 mg by mouth 2 (two) times daily. 05/19/21   [provider]  ondansetron (ZOFRAN-ODT) 4 MG disintegrating tablet 24m ODT q4 hours prn nausea/vomit Patient taking differently: Take 4 mg by mouth every 8 (eight) hours as needed for vomiting or nausea. 03/09/21   YDrenda Freeze MD  oxyCODONE-acetaminophen (PERCOCET) 10-325 MG tablet Take 1 tablet by mouth every 6 (six) hours as needed for pain. 12/03/19   [provider]  OXYGEN Inhale 4 L into the lungs continuous.    [provider]  phentermine (ADIPEX-P) 37.5 MG tablet Take 37.5 mg by mouth daily. 05/25/21   [provider]  potassium chloride SA (K-DUR,KLOR-CON) 20 MEQ tablet Take 1 tablet (20 mEq total) by mouth 2 (two) times daily. 07/24/18   MLarey Dresser MD  rosuvastatin (CRESTOR) 10 MG tablet Take 10 mg by mouth every evening. 11/19/19   [provider]  senna-docusate (SENOKOT-S) 8.6-50 MG tablet Take 1 tablet by mouth daily.    [provider]  torsemide (DEMADEX) 20 MG tablet TAKE 2 TABLETS (40 MG TOTAL) BY MOUTH 2 (TWO) TIMES DAILY. 07/05/21   MLarey Dresser MD  traZODone (DESYREL) 50 MG tablet Take 100 mg by mouth at bedtime. 11/19/19   [provider]  triamcinolone cream (KENALOG) 0.1 % Apply 1 application. topically 2 (two) times daily as needed (irrritation). 09/10/20   [provider]  umeclidinium-vilanterol (ANORO ELLIPTA) 62.5-25 MCG/INH AEPB Inhale  1 puff into the lungs daily.    [provider]     Physical Exam: Vitals:   02/01/22 1600 02/01/22 1700 02/01/22 1715 02/01/22 1828  BP: 110/66 97/61 109/69   Pulse: 82 81 82   Resp: 18 18 (!) 21   Temp:      TempSrc:      SpO2: 100% 99% 98%   Weight:    127.6 kg  Height:    _0  (1.651 m)   General:  Appears calm and comfortable and is in NAD, somnolent Eyes:  EOMI, normal lids, iris ENT:  grossly normal hearing, lips & tongue, mmm Neck:  no LAD, masses or thyromegaly Cardiovascular:  RRR, no m/r/g. No LE edema.  Respiratory:   CTA bilaterally with no wheezes/rales/rhonchi.  Normal respiratory effort. Abdomen:  soft, NT, ND Skin:  no rash or induration seen on limited exam Musculoskeletal:  grossly normal tone BUE/BLE, good ROM, no bony abnormality Psychiatric:  somnolent mood and affect, speech fluent and appropriate, AOx3 Neurologic:  CN 2-12 grossly intact, moves all extremities in coordinated fashion   Radiological Exams on Admission: Independently reviewed - see discussion in A/P where applicable  CT ABDOMEN PELVIS WO CONTRAST  Result Date: 02/01/2022 CLINICAL DATA:  Abdominal pain EXAM: CT ABDOMEN AND PELVIS WITHOUT CONTRAST TECHNIQUE: Multidetector CT imaging of the abdomen and pelvis was performed following the standard protocol without IV contrast. RADIATION DOSE REDUCTION: This exam was performed according to the departmental dose-optimization program which includes automated exposure control, adjustment of the mA and/or kV according to patient size and/or use of iterative reconstruction technique. COMPARISON:  06/09/2021 FINDINGS: Lower chest: Scattered areas of discoid atelectasis noted within both lung bases. Hepatobiliary: There is diffuse hepatic steatosis. No focal liver abnormality noted. Gallbladder appears normal. No bile duct dilatation. Pancreas: Unremarkable. No pancreatic ductal dilatation or surrounding inflammatory changes. Spleen: Normal in size without focal abnormality. Adrenals/Urinary Tract: Normal  adrenal glands. No nephrolithiasis, hydronephrosis, or mass identified. Urinary bladder is unremarkable. Stomach/Bowel: Stomach appears normal. No bowel wall thickening, inflammation or distension. The appendix is visualized and is within normal limits. Vascular/Lymphatic: Aortic atherosclerosis. No signs of abdominopelvic adenopathy. Reproductive: Status post hysterectomy. No adnexal masses. Other: No free fluid or fluid collections. No signs of pneumoperitoneum. Musculoskeletal: No acute or significant osseous findings. IMPRESSION: 1. No acute findings within the abdomen or pelvis. 2. Hepatic steatosis. 3.  Aortic Atherosclerosis (ICD10-I70.0). Electronically Signed   By: Kerby Moors M.D.   On: 02/01/2022 07:43   DG Chest 1 View  Result Date: 01/31/2022 CLINICAL DATA:  Chest pain. Fever, chills, sore throat, and nausea for 2 days. EXAM: CHEST  1 VIEW COMPARISON:  11/11/2021 FINDINGS: Shallow inspiration. Cardiac enlargement. Prominent central pulmonary vascularity with perihilar and basilar interstitial infiltration. This may represent edema or multifocal pneumonia. Linear atelectasis in the lung bases. No pleural effusions. No pneumothorax. IMPRESSION: Cardiac enlargement with mild vascular congestion and perihilar/basilar infiltration. Linear atelectasis in the lung bases. Electronically Signed   By: Lucienne Capers M.D.   On: 01/31/2022 22:35    EKG: Independently reviewed.  NSR with rate 83; PVC with no evidence of acute ischemia   Labs on Admission: I have personally reviewed the available labs and imaging studies at the time of the admission.  Pertinent labs:    K+ 5.4 BUN 48/Creatinine 3.04/GFR 17; 19/1.11/57 on 8/4 HS troponin 3 WBC 16.6 Hgb 9.6 COVID/flu negative   Assessment and Plan: Principal Problem:   Acute renal failure (ARF) (HCC)  Active Problems:   Dyslipidemia associated with type 2 diabetes mellitus ([place])   Controlled type 2 diabetes mellitus without complication,  without long-term current use of insulin (HCC)   Anxiety and depression   Benign essential HTN   Gout   Obesity, Class III, BMI 40-49.9 (morbid obesity) (HCC)   Chronic diastolic CHF (congestive heart failure) (HCC)   Hypothyroidism   Chronic pain disorder    Acute renal failure on stage 3a CKD -Patient with baseline creatinine 1.11, presenting with reported URI symptoms (none reported at the time of my evaluation) and found to have renal failure with creatinine 3.04 -She has chronic pain and appeared oversedated at the time of my evaluation - despite not obviously being given sedating meds by the EDP, concerning for taking her own medications while in the ER -Abdominal CT negative -Likely due to prerenal failure secondary to dehydration and continuation of ACEI or ARB, diruetics and NSAIDs use. -Hold diuretics for now -IVF  -Follow up renal function by BMP -Avoid ACEI and NSAIDs  -Continue allopurinol for gout -She is on calcitriol, will continue  COPD -On 3L home O2 and currently on 3L -Continue Albuterol, Duonebs, Xyzal, Anoro  Chronic diastolic CHF -Appears compensated, somewhat hypovolemic (difficult to gauge based on body habitus) -Hold torsemide -Continue ASA  DM -Will check A1c -hold Glucophage -Cover with moderate-scale SSI   HTN -Borderline BPs in the ER -Hold carvedilol, lisinopril  HLD -Hold fenofibrate due to limited inpatient utility -Continue rosuvastatin  Hypothyroidism -Continue Synthroid  Depression/anxiety -Continue buspirone, Klonopin, trazodone  Chronic pain -I have reviewed this patient in the [place] Controlled Substances Reporting System.  She is receiving medications from only one provider and appears to be taking them as prescribed. -She is at particularly high risk of opioid misuse, diversion, or overdose.  -Continue Flexeril, Percocet - but do not escalate use and home meds need to be out of patient's reach  Morbid obesity -Body mass  index is 46.81 kg/m..  -Weight loss should be encouraged -Hold phentermine -Outpatient PCP/bariatric medicine/bariatric surgery f/u encouraged     Advance Care Planning:   Code Status: Full Code   Consults: TOC team; nutrition; PT/OT  DVT Prophylaxis: Lovenox  Family Communication: None present  Severity of Illness: The appropriate patient status for this patient is INPATIENT. Inpatient status is judged to be reasonable and necessary in order to provide the required intensity of service to ensure the patient's safety. The patient's presenting symptoms, physical exam findings, and initial radiographic and laboratory data in the context of their chronic comorbidities is felt to place them at high risk for further clinical deterioration. Furthermore, it is not anticipated that the patient will be medically stable for discharge from the hospital within 2 midnights of admission.   * I certify that at the point of admission it is my clinical judgment that the patient will require inpatient hospital care spanning beyond 2 midnights from the point of admission due to high intensity of service, high risk for further deterioration and high frequency of surveillance required.*  Author: Karmen Bongo, MD 02/01/2022 6:39 PM  For on call review [email or web address].

## 2022-02-01 NOTE — Progress Notes (Signed)
New Admission Note:   Arrival Method: Stretcher  Mental Orientation: Alert and oriented x4  Telemetry: tele box 22 Assessment: Completed Skin: intact  JR:PZPSU forearm  Pain: 8/10  Tubes: nasal canula  Safety Measures: Safety Fall Prevention Plan has been given, discussed and signed Admission: Completed [facility] Orientation: Patient has been orientated to the room, unit and staff.  Family: none   Orders have been reviewed and implemented. Will continue to monitor the patient. Call light has been placed within reach and bed alarm has been activated.   Yuriana Gaal RN [facility] Phone: [phone]

## 2022-02-02 DIAGNOSIS — N178 Other acute kidney failure: Secondary | ICD-10-CM | POA: Diagnosis not present

## 2022-02-02 DIAGNOSIS — I5032 Chronic diastolic (congestive) heart failure: Secondary | ICD-10-CM | POA: Diagnosis not present

## 2022-02-02 DIAGNOSIS — E119 Type 2 diabetes mellitus without complications: Secondary | ICD-10-CM | POA: Diagnosis not present

## 2022-02-02 LAB — CBC
HCT: 29.8 % — ABNORMAL LOW (ref 36.0–46.0)
Hemoglobin: 9.3 g/dL — ABNORMAL LOW (ref 12.0–15.0)
MCH: 30.9 pg (ref 26.0–34.0)
MCHC: 31.2 g/dL (ref 30.0–36.0)
MCV: 99 fL (ref 80.0–100.0)
Platelets: 276 10*3/uL (ref 150–400)
RBC: 3.01 MIL/uL — ABNORMAL LOW (ref 3.87–5.11)
RDW: 14.2 % (ref 11.5–15.5)
WBC: 10.8 10*3/uL — ABNORMAL HIGH (ref 4.0–10.5)
nRBC: 0 % (ref 0.0–0.2)

## 2022-02-02 LAB — GLUCOSE, CAPILLARY
Glucose-Capillary: 96 mg/dL (ref 70–99)
Glucose-Capillary: 83 mg/dL (ref 70–99)
Glucose-Capillary: 97 mg/dL (ref 70–99)
Glucose-Capillary: 100 mg/dL — ABNORMAL HIGH (ref 70–99)

## 2022-02-02 LAB — URINALYSIS, ROUTINE W REFLEX MICROSCOPIC
Bilirubin Urine: NEGATIVE
Glucose, UA: NEGATIVE mg/dL
Hgb urine dipstick: NEGATIVE
Ketones, ur: NEGATIVE mg/dL
Leukocytes,Ua: NEGATIVE
Nitrite: NEGATIVE
Protein, ur: NEGATIVE mg/dL
Specific Gravity, Urine: 1.008 (ref 1.005–1.030)
pH: 7 (ref 5.0–8.0)

## 2022-02-02 LAB — BASIC METABOLIC PANEL
Anion gap: 8 (ref 5–15)
BUN: 26 mg/dL — ABNORMAL HIGH (ref 8–23)
CO2: 30 mmol/L (ref 22–32)
Calcium: 8.5 mg/dL — ABNORMAL LOW (ref 8.9–10.3)
Chloride: 104 mmol/L (ref 98–111)
Creatinine, Ser: 1.21 mg/dL — ABNORMAL HIGH (ref 0.44–1.00)
GFR, Estimated: 51 mL/min — ABNORMAL LOW (ref >60)
Glucose, Bld: 96 mg/dL (ref 70–99)
Potassium: 4.7 mmol/L (ref 3.5–5.1)
Sodium: 142 mmol/L (ref 135–145)

## 2022-02-02 MED ORDER — POLYETHYLENE GLYCOL 3350 17 G PO PACK
17.0000 g | PACK | Freq: Every day | ORAL | Status: DC
  Filled 2022-02-02: qty 1

## 2022-02-02 MED ORDER — GUAIFENESIN ER 600 MG PO TB12
600.0000 mg | ORAL_TABLET | Freq: Two times a day (BID) | ORAL | Status: DC
  Administered 2022-02-02 – 2022-02-03 (×3): 600 mg via ORAL
  Filled 2022-02-02 (×3): qty 1

## 2022-02-02 MED ORDER — SENNOSIDES-DOCUSATE SODIUM 8.6-50 MG PO TABS
1.0000 | ORAL_TABLET | Freq: Two times a day (BID) | ORAL | Status: DC
  Administered 2022-02-02 (×2): 1 via ORAL
  Filled 2022-02-02 (×3): qty 1

## 2022-02-02 MED ORDER — FLUTICASONE PROPIONATE 50 MCG/ACT NA SUSP
2.0000 | Freq: Every day | NASAL | Status: DC
  Administered 2022-02-03: 2 via NASAL
  Filled 2022-02-02 (×2): qty 16

## 2022-02-02 NOTE — Evaluation (Signed)
Physical Therapy Evaluation Patient Details Name: Michele Owens MRN: [id number] DOB: [birth year] Today's Date: 02/02/2022  History of Present Illness  61 y.o. female with medical history significant of chronic diastolic CHF; DM; COPD on 3L home O2; HTN; and depression presenting with fever, nausea, sore throat.  Clinical Impression  Patient admitted with the above. PTA, patient lives alone and uses [facility] but rollator for community mobility. Patient functioning at modI level with use of SPC. Ambulated on RA with 2/4 DOE. Patient stating she uses her O2 as needed at home. Unsure of accuracy. No further skilled PT needs identified acutely. Will defer further mobility to mobility specialists and nursing staff. No PT follow up recommended at this time. PT will sign off.         Recommendations for follow up therapy are one component of a multi-disciplinary discharge planning process, led by the attending physician.  Recommendations may be updated based on patient status, additional functional criteria and insurance authorization.  Follow Up Recommendations No PT follow up      Assistance Recommended at Discharge PRN  Patient can return home with the following       Equipment Recommendations None recommended by PT  Recommendations for Other Services       Functional Status Assessment Patient has had a recent decline in their functional status and demonstrates the ability to make significant improvements in function in a reasonable and predictable amount of time.     Precautions / Restrictions Precautions Precautions: Other (comment) Precaution Comments: O2 sats Restrictions Weight Bearing Restrictions: No      Mobility  Bed Mobility                    Transfers Overall transfer level: Modified independent Equipment used: Straight cane                    Ambulation/Gait Ambulation/Gait assistance: Modified independent (Device/Increase time) Gait Distance  (Feet): 200 Feet Assistive device: Straight cane Gait Pattern/deviations: WFL(Within Functional Limits) Gait velocity: decreased     General Gait Details: no overt LOB. 2/4 DOE on RA throughout ambulation. Unable to get good reading due to nail polish  Stairs            Wheelchair Mobility    Modified Rankin (Stroke Patients Only)       Balance Overall balance assessment: Mild deficits observed, not formally tested, History of Falls                                           Pertinent Vitals/Pain Pain Assessment Pain Assessment: No/denies pain    Home Living Family/patient expects to be discharged to:: Private residence Living Arrangements: Alone Available Help at Discharge: Family;Available PRN/intermittently Type of Home: Apartment Home Access: Level entry       Home Layout: One level Home Equipment: Cane - single point;Rollator (4 wheels);Shower seat;BSC/3in1 Additional Comments: Home O2, needs a new RW    Prior Function Prior Level of Function : Independent/Modified Independent             Mobility Comments: reports 1 fall in psat 6 months       Hand Dominance        Extremity/Trunk Assessment   Upper Extremity Assessment Upper Extremity Assessment: Defer to OT evaluation    Lower Extremity Assessment Lower Extremity Assessment: Generalized weakness    Cervical /  Trunk Assessment Cervical / Trunk Assessment: Kyphotic  Communication   Communication: No difficulties  Cognition Arousal/Alertness: Awake/alert Behavior During Therapy: WFL for tasks assessed/performed Overall Cognitive Status: Within Functional Limits for tasks assessed                                 General Comments: possible decreased safety awareness in ambulating without O2        General Comments      Exercises     Assessment/Plan    PT Assessment Patient does not need any further PT services  PT Problem List         PT  Treatment Interventions      PT Goals (Current goals can be found in the Care Plan section)  Acute Rehab PT Goals Patient Stated Goal: to go home PT Goal Formulation: All assessment and education complete, DC therapy    Frequency       Co-evaluation               AM-PAC PT "6 Clicks" Mobility  Outcome Measure Help needed turning from your back to your side while in a flat bed without using bedrails?: None Help needed moving from lying on your back to sitting on the side of a flat bed without using bedrails?: None Help needed moving to and from a bed to a chair (including a wheelchair)?: None Help needed standing up from a chair using your arms (e.g., wheelchair or bedside chair)?: None Help needed to walk in hospital room?: None Help needed climbing 3-5 steps with a railing? : None 6 Click Score: 24    End of Session   Activity Tolerance: Patient tolerated treatment well Patient left: in bed;with call bell/phone within reach;with nursing/sitter in room (seated EOB with RN present) Nurse Communication: Mobility status PT Visit Diagnosis: Muscle weakness (generalized) (M62.81)    Time: 1450-1503 PT Time Calculation (min) (ACUTE ONLY): 13 min   Charges:   PT Evaluation $PT Eval Low Complexity: 1 Low          Markevion Lattin A. Gilford Rile PT, DPT Acute Rehabilitation Services Office [phone]   Linna Hoff 02/02/2022, 4:18 PM

## 2022-02-02 NOTE — Progress Notes (Signed)
PROGRESS NOTE    Michele Owens  TDV:[id number] DOB: [birth year] DOA: 01/31/2022 PCP: [name], MD     Brief Narrative:  H/o HTN, HLD, NIDDM2, dCHF, COPD on home oxygen 3liters presents to the hospital via EMS due to multiple complaints, she appears to be not a good historian, not sure history is reliable but per EDP note" Patient reports that she has not been feeling well for some days.  She has had nausea and vomiting, was prescribed an antiemetic by her doctor but the pharmacy did not have any.  Patient has been experiencing cough and congestion, feels more short of breath than usual.  She is normally on oxygen continuously at home.  She has been having some pain on the right side of her chest intermittently.  Patient reports diffuse abdominal pain.: per admitting HP, patient presents with " fever, nausea, sore throat"  She is found to have aki  Subjective:  Currently denies Ab pain, no nausea, appear breathing comfortably on home o2  Date personally reviewed:  Cr improved, but not back to baseline, I do not see ua, will add on ua and urine culture due to reports of fever and ab pain Wbc improved No fever Increase stool burdon on Ct ab/pel, start senna Ct low chest showed atelectasis, denies sob, no cough, no chest pain  Reports feeling her body is starting to build up fluids,d/c ivf, continue hold lasix, repeat bmp in am Pt eval   Assessment & Plan:  Principal Problem:   Acute renal failure (ARF) ([place]) Active Problems:   Dyslipidemia associated with type 2 diabetes mellitus ([place])   Controlled type 2 diabetes mellitus without complication, without long-term current use of insulin (HCC)   Anxiety and depression   Benign essential HTN   Gout   Obesity, Class III, BMI 40-49.9 (morbid obesity) ([place])   Chronic diastolic CHF (congestive heart failure) (HCC)   Hypothyroidism   Chronic pain disorder  Fever /sore throat? No fever observed here, covid/flu screening  negative Monitor   AKI on CKD3a -Ct ab no obstructive uropathy, check ua and urine culture -cr improved with hydration, d/c hydration, continue hold diuretics -repeat bmp in am, renal dosing meds  HTN: presents with hypotension, bp 77/48 in the ED She received hydration , home bp stable on on coreg, lisinopril, demadex held, gradually resume when able  Chronic dCHF Close monitor volume status Resume diuretics, hopefully tomorrow    Noninsulin dependent DM2, controlled  a1c 6.1 Am glucose 96 hold home meds metformin, currently on ssi  COPD on home o2  No wheezing, no cough Continue home meds   History multinodular goiter and Thyroid cancer s/p Thyroidectomy -Continue Synthroid 224 daily  Constipation:  Start stool softener    Morbid obesity: Body mass index is 46.81 kg/m..   I have Reviewed nursing notes, Vitals, pain scores, I/o's, Lab results and  imaging results since pt's last encounter, details please see discussion above  I ordered the following labs:  Unresulted Labs (From admission, onward)     Start     Ordered   02/03/22 0500  CBC with Differential/Platelet  Tomorrow morning,   R        02/02/22 2114   02/03/22 0626  Basic metabolic panel  Tomorrow morning,   R        02/02/22 2114   02/02/22 0709  Urine Culture  (Urine Culture)  Once,   R       Question:  Indication  Answer:  Altered mental status (if no other cause identified)   02/02/22 0708             DVT prophylaxis: enoxaparin (LOVENOX) injection 30 mg Start: 02/01/22 1230   Code Status:   Code Status: Full Code  Family Communication: she declined my offer to update her family, in fact , she states no one should contact her family, she can give update herself Disposition:    Dispo: The patient is from: home              Anticipated d/c is to: home              Anticipated d/c date is: home tomorrow if cr continues to improve, f/u on urine culture  Antimicrobials:   Anti-infectives  (From admission, onward)    Start     Dose/Rate Route Frequency Ordered Stop   02/02/22 1000  valACYclovir (VALTREX) tablet 1,000 mg        1,000 mg Oral Daily 02/01/22 1828              Objective: Vitals:   02/01/22 2104 02/02/22 0510 02/02/22 0926 02/02/22 1700  BP: (!) 96/58 108/86 116/80 125/66  Pulse: 90 84 82 82  Resp: 18  18   Temp: 98.3 F (36.8 C) 97.9 F (36.6 C) 98.2 F (36.8 C) 97.8 F (36.6 C)  TempSrc: Oral Oral Oral Oral  SpO2: 95% 93% 97% 97%  Weight:      Height:        Intake/Output Summary (Last 24 hours) at 02/02/2022 2116 Last data filed at 02/02/2022 2043 Gross per 24 hour  Intake 2091 ml  Output 1426 ml  Net 665 ml   Filed Weights   01/31/22 2155 02/01/22 1828  Weight: 136.1 kg 127.6 kg    Examination:  General exam: alert, awake, communicative,calm, NAD Respiratory system: Clear to auscultation. Respiratory effort normal. Cardiovascular system:  RRR.  Gastrointestinal system: Abdomen is nondistended, soft and nontender.  Normal bowel sounds heard. Central nervous system: Alert and oriented. No focal neurological deficits. Extremities:  no edema Skin: No rashes, lesions or ulcers Psychiatry: Judgement and insight appear normal. Mood & affect appropriate.     Data Reviewed: I have personally reviewed  labs and visualized  imaging studies since the last encounter and formulate the plan        Scheduled Meds:  allopurinol  100 mg Oral Daily   aspirin EC  81 mg Oral Daily   calcitRIOL  0.5 mcg Oral Daily   enoxaparin (LOVENOX) injection  30 mg Subcutaneous Q24H   fluticasone  2 spray Each Nare Daily   guaiFENesin  600 mg Oral BID   insulin aspart  0-15 Units Subcutaneous TID WC   insulin aspart  0-5 Units Subcutaneous QHS   ipratropium-albuterol  3 mL Nebulization TID   levothyroxine  224 mcg Oral QAC breakfast   loratadine  10 mg Oral QHS   [START ON 02/03/2022] polyethylene glycol  17 g Oral Daily   rosuvastatin  10 mg  Oral QPM   senna-docusate  1 tablet Oral BID   sodium chloride flush  3 mL Intravenous Q12H   traZODone  100 mg Oral QHS   umeclidinium-vilanterol  1 puff Inhalation Daily   valACYclovir  1,000 mg Oral Daily   Continuous Infusions:     LOS: 1 day     Florencia Reasons, MD PhD FACP [facility]  Available via Epic secure chat 7am-7pm for nonurgent issues Please page  for urgent issues To page the attending provider between 7A-7P or the covering provider during after hours 7P-7A, please log into the web site [email or web address] and access using universal [place] password for that web site. If you do not have the password, please call the hospital operator.    02/02/2022, 9:16 PM

## 2022-02-02 NOTE — Plan of Care (Signed)
  Problem: Elimination: Goal: Will not experience complications related to bowel motility Outcome: Progressing Goal: Will not experience complications related to urinary retention Outcome: Progressing   

## 2022-02-02 NOTE — TOC Initial Note (Signed)
Transition of Care [facility]) - Initial/Assessment Note    Patient Details  Name: Michele Owens MRN: [id number] Date of Birth: Sep 06, 1960  Transition of Care [facility]) CM/SW Contact:    Tom-Johnson, Renea Ee, RN Phone Number: 02/02/2022, 9:18 PM  Clinical Narrative:                  CM spoke with patient at bedside about needs for post hospital transition. Admitted for Acute Renal Failure. On 3L O2 chronic. Receives O2 supplies from [place].  Patient lives alone, has a daughter. Sates she is still grieving the death of her son who was killed this past April. Has a PCP but could not tell what agency she works for. Uses [facility] to and from appointments. Has home O2 and rollator.  PCP is [name], MD and uses [facility].  No PT/OT f/u noted. Patient requests Cab at discharge. CM will continue to follow as patient progresses with care towards discharge.    Expected Discharge Plan: Home/Self Care Barriers to Discharge: Continued Medical Work up   Patient Goals and CMS Choice Patient states their goals for this hospitalization and ongoing recovery are:: To return home CMS Medicare.gov Compare Post Acute Care list provided to:: Patient Choice offered to / list presented to : NA  Expected Discharge Plan and Services Expected Discharge Plan: Home/Self Care   Discharge Planning Services: CM Consult Post Acute Care Choice: NA Living arrangements for the past 2 months: Apartment                 DME Arranged: N/A DME Agency: NA       HH Arranged: NA HH Agency: NA        Prior Living Arrangements/Services Living arrangements for the past 2 months: Apartment Lives with:: Self Patient language and need for interpreter reviewed:: Yes Do you feel safe going back to the place where you live?: Yes      Need for Family Participation in Patient Care: Yes (Comment) Care giver support system in place?: Yes (comment) Current home services: DME (O2, rollator) Criminal  Activity/Legal Involvement Pertinent to Current Situation/Hospitalization: No - Comment as needed  Activities of Daily Living      Permission Sought/Granted Permission sought to share information with : Case Manager, Family Supports Permission granted to share information with : Yes, Verbal Permission Granted              Emotional Assessment Appearance:: Appears stated age Attitude/Demeanor/Rapport: Engaged, Gracious Affect (typically observed): Accepting, Appropriate, Calm, Hopeful, Pleasant Orientation: : Oriented to Self, Oriented to Place, Oriented to  Time, Oriented to Situation Alcohol / Substance Use: Not Applicable Psych Involvement: No (comment)  Admission diagnosis:  Generalized abdominal pain [R10.84] Acute renal failure (ARF) (HCC) [N17.9] AKI (acute kidney injury) ([place]) [N17.9] Patient Active Problem List   Diagnosis Date Noted   Acute renal failure (ARF) ([place]) 02/01/2022   Hypothyroidism 02/01/2022   Chronic pain disorder 02/01/2022   Respiratory failure with hypercapnia ([place]) 12/23/2020   Anemia 12/23/2020   Acute on chronic respiratory failure with hypoxia and hypercapnia ([place]) 04/18/2020   Acute on chronic respiratory failure with hypoxia ([place]) 04/18/2020   Hemoptysis 13/11/6576   Acute metabolic encephalopathy 46/96/2952   Acute encephalopathy 04/18/2020   Chronic diastolic CHF (congestive heart failure) ([place]) 04/18/2020   Sepsis ([place]) 04/18/2020   Obesity, Class III, BMI 40-49.9 (morbid obesity) ([place])    Fall at home, initial encounter 06/23/2018   HNP (herniated nucleus pulposus), thoracic 06/23/2018  CHF (congestive heart failure) ([place]) 06/18/2018   Hypertensive disorder 08/28/2017   Obesity 08/28/2017   COPD exacerbation ([place]) 08/04/2017   Acute and chronic respiratory failure with hypercapnia ([place])    Pulmonary HTN ([place])    Genetic testing 02/27/2017   Family history of thyroid cancer    Hypomagnesemia 10/03/2016   Atypical chest pain     HLD (hyperlipidemia) 09/23/2016   Gout 09/23/2016   DVT (deep vein thrombosis) in pregnancy 09/23/2016   Thyroid cancer ([place]) 09/23/2016   Hypocalcemia 09/23/2016   AKI (acute kidney injury) ([place]) 09/23/2016   Acute pulmonary edema (HCC)    Acute hypoxemic respiratory failure (HCC)    Ventilator dependent (HCC)    Chronic respiratory failure with hypoxia and hypercapnia ([place]) 07/16/2016   CAP (community acquired pneumonia) 01/23/2016   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 01/23/2016   Pulmonary hypertension ([place]) 01/23/2016   Obesity hypoventilation syndrome ([place]) 08/26/2015   Dyslipidemia associated with type 2 diabetes mellitus ([place]) 04/24/2015   Leukocytosis 04/24/2015   Controlled type 2 diabetes mellitus without complication, without long-term current use of insulin ([place]) 04/24/2015   Anxiety and depression 04/24/2015   Benign essential HTN 04/24/2015   Normocytic anemia 04/24/2015   OSA (obstructive sleep apnea) 05/10/2012   Tobacco abuse 07/04/2011   PCP:  [name], MD Pharmacy:   [place], [place] - [address] [place] [address] Phone: [phone] Fax: [place], [place] - [address] [place] [address] Phone: [phone] Fax: [phone]     Social Determinants of Health (SDOH) Interventions    Readmission Risk Interventions     No data to display

## 2022-02-02 NOTE — Evaluation (Signed)
Occupational Therapy Evaluation Patient Details Name: Michele Owens MRN: [id number] DOB: 19-Jan-1961 Today's Date: 02/02/2022   History of Present Illness 61 y.o. female with medical history significant of chronic diastolic CHF; DM; COPD on 3L home O2; HTN; and depression presenting with fever, nausea, sore throat.   Clinical Impression   Patient admitted for the diagnosis above.  PTA she lives alone in a one level apartment.  She presents at, or near, her baseline for in room mobility/toileting and ADL from a sit to stand level.  No significant OT needs exist in the acute setting.  No post acute OT anticipated.        Recommendations for follow up therapy are one component of a multi-disciplinary discharge planning process, led by the attending physician.  Recommendations may be updated based on patient status, additional functional criteria and insurance authorization.   Follow Up Recommendations  No OT follow up    Assistance Recommended at Discharge PRN  Patient can return home with the following Assist for transportation    Functional Status Assessment  Patient has not had a recent decline in their functional status  Equipment Recommendations  Other (comment) (Patient needs a new 5ZWC)    Recommendations for Other Services       Precautions / Restrictions Precautions Precautions: Other (comment) Precaution Comments: O2 sats Restrictions Weight Bearing Restrictions: No      Mobility Bed Mobility Overal bed mobility: Modified Independent               Patient Response: Cooperative  Transfers Overall transfer level: Modified independent                 General transfer comment: SPC      Balance Overall balance assessment: Mild deficits observed, not formally tested                                         ADL either performed or assessed with clinical judgement   ADL Overall ADL's : At baseline                                              Vision Baseline Vision/History: 1 Wears glasses Patient Visual Report: No change from baseline       Perception Perception Perception: Within Functional Limits   Praxis Praxis Praxis: Intact    Pertinent Vitals/Pain Pain Assessment Pain Assessment: Faces Faces Pain Scale: Hurts a little bit Pain Location: chronic back and knee pain Pain Descriptors / Indicators: Aching Pain Intervention(s): Monitored during session     Hand Dominance Right   Extremity/Trunk Assessment Upper Extremity Assessment Upper Extremity Assessment: Overall WFL for tasks assessed   Lower Extremity Assessment Lower Extremity Assessment: Defer to PT evaluation   Cervical / Trunk Assessment Cervical / Trunk Assessment: Kyphotic   Communication Communication Communication: No difficulties   Cognition Arousal/Alertness: Awake/alert Behavior During Therapy: WFL for tasks assessed/performed Overall Cognitive Status: Within Functional Limits for tasks assessed                                       General Comments  uses [employer] for outside and [facility] in the home    Exercises  Shoulder Instructions      Home Living Family/patient expects to be discharged to:: Private residence Living Arrangements: Alone Available Help at Discharge: Family;Available PRN/intermittently Type of Home: Apartment Home Access: Level entry     Home Layout: One level     Bathroom Shower/Tub: Teacher, early years/pre: Standard Bathroom Accessibility: Yes How Accessible: Accessible via walker Home Equipment: Cane - single point;Rollator (4 wheels);Shower seat;BSC/3in1   Additional Comments: Home O2, needs a new RW      Prior Functioning/Environment Prior Level of Function : Independent/Modified Independent               ADLs Comments: No assist for ADL and iADL.  Does not drive.  Son shot and killed earlier this year.        OT Problem List:  Pain;Decreased activity tolerance      OT Treatment/Interventions:      OT Goals(Current goals can be found in the care plan section) Acute Rehab OT Goals Patient Stated Goal: Return home OT Goal Formulation: With patient Time For Goal Achievement: 02/13/22 Potential to Achieve Goals: Good  OT Frequency:      Co-evaluation              AM-PAC OT "6 Clicks" Daily Activity     Outcome Measure Help from another person eating meals?: None Help from another person taking care of personal grooming?: None Help from another person toileting, which includes using toliet, bedpan, or urinal?: None Help from another person bathing (including washing, rinsing, drying)?: A Little Help from another person to put on and taking off regular upper body clothing?: None Help from another person to put on and taking off regular lower body clothing?: A Little 6 Click Score: 22   End of Session Equipment Utilized During Treatment: Other (comment) [facility]) Nurse Communication: Mobility status  Activity Tolerance: Patient tolerated treatment well Patient left: in chair;with call bell/phone within reach  OT Visit Diagnosis: Other (comment) (Shortness of breath)                Time: 5597-4163 OT Time Calculation (min): 19 min Charges:  OT General Charges $OT Visit: 1 Visit OT Evaluation $OT Eval Moderate Complexity: 1 Mod  02/02/2022  RP, OTR/L  Acute Rehabilitation Services  Office:  [phone]   Metta Clines 02/02/2022, 9:25 AM

## 2022-02-03 DIAGNOSIS — N179 Acute kidney failure, unspecified: Secondary | ICD-10-CM

## 2022-02-03 DIAGNOSIS — I5032 Chronic diastolic (congestive) heart failure: Secondary | ICD-10-CM | POA: Diagnosis not present

## 2022-02-03 DIAGNOSIS — E119 Type 2 diabetes mellitus without complications: Secondary | ICD-10-CM | POA: Diagnosis not present

## 2022-02-03 LAB — BASIC METABOLIC PANEL
Anion gap: 8 (ref 5–15)
BUN: 11 mg/dL (ref 8–23)
CO2: 30 mmol/L (ref 22–32)
Calcium: 9 mg/dL (ref 8.9–10.3)
Chloride: 103 mmol/L (ref 98–111)
Creatinine, Ser: 0.87 mg/dL (ref 0.44–1.00)
GFR, Estimated: >60 mL/min (ref >60)
Glucose, Bld: 90 mg/dL (ref 70–99)
Potassium: 4.3 mmol/L (ref 3.5–5.1)
Sodium: 141 mmol/L (ref 135–145)

## 2022-02-03 LAB — CBC WITH DIFFERENTIAL/PLATELET
Abs Immature Granulocytes: 0.06 10*3/uL (ref 0.00–0.07)
Basophils Absolute: 0 10*3/uL (ref 0.0–0.1)
Basophils Relative: 0 %
Eosinophils Absolute: 0.4 10*3/uL (ref 0.0–0.5)
Eosinophils Relative: 4 %
HCT: 30.1 % — ABNORMAL LOW (ref 36.0–46.0)
Hemoglobin: 9.6 g/dL — ABNORMAL LOW (ref 12.0–15.0)
Immature Granulocytes: 1 %
Lymphocytes Relative: 19 %
Lymphs Abs: 1.9 10*3/uL (ref 0.7–4.0)
MCH: 31 pg (ref 26.0–34.0)
MCHC: 31.9 g/dL (ref 30.0–36.0)
MCV: 97.1 fL (ref 80.0–100.0)
Monocytes Absolute: 0.7 10*3/uL (ref 0.1–1.0)
Monocytes Relative: 7 %
Neutro Abs: 7 10*3/uL (ref 1.7–7.7)
Neutrophils Relative %: 69 %
Platelets: 279 10*3/uL (ref 150–400)
RBC: 3.1 MIL/uL — ABNORMAL LOW (ref 3.87–5.11)
RDW: 14.1 % (ref 11.5–15.5)
WBC: 10.2 10*3/uL (ref 4.0–10.5)
nRBC: 0 % (ref 0.0–0.2)

## 2022-02-03 LAB — MAGNESIUM: Magnesium: 2 mg/dL (ref 1.7–2.4)

## 2022-02-03 LAB — GLUCOSE, CAPILLARY: Glucose-Capillary: 93 mg/dL (ref 70–99)

## 2022-02-03 LAB — URINE CULTURE: Culture: NO GROWTH

## 2022-02-03 MED ORDER — LISINOPRIL 5 MG PO TABS: 5.0000 mg | ORAL_TABLET | Freq: Every day | ORAL | Status: DC

## 2022-02-03 MED ORDER — POLYETHYLENE GLYCOL 3350 17 G PO PACK: 17.0000 g | PACK | Freq: Every day | ORAL | 0 refills | Status: DC | PRN

## 2022-02-03 MED ORDER — TORSEMIDE 20 MG PO TABS: 40.0000 mg | ORAL_TABLET | Freq: Every day | ORAL | Status: DC

## 2022-02-03 MED ORDER — ENOXAPARIN SODIUM 60 MG/0.6ML IJ SOSY: 60.0000 mg | PREFILLED_SYRINGE | INTRAMUSCULAR | Status: DC

## 2022-02-03 NOTE — TOC Transition Note (Signed)
Transition of Care [facility]) - CM/SW Discharge Note   Patient Details  Name: Jessieca Rhem MRN: [id number] Date of Birth: 20-Oct-1960  [facility]) CM/SW Contact:  Milinda Antis, LCSWA Phone Number: 02/03/2022, 12:19 PM   Clinical Narrative:    Patient discharging home without any needs.  Cab voucher provided by LCSW.  TOC signing off.    Final next level of care: Home/Self Care Barriers to Discharge: Barriers Resolved   Patient Goals and CMS Choice Patient states their goals for this hospitalization and ongoing recovery are:: To return home CMS Medicare.gov Compare Post Acute Care list provided to:: Patient Choice offered to / list presented to : NA  Discharge Placement                Patient to be transferred to facility by: Cab Name of family member notified: Patient alert and oriented    Discharge Plan and Services   Discharge Planning Services: CM Consult Post Acute Care Choice: NA          DME Arranged: N/A DME Agency: NA       HH Arranged: NA HH Agency: NA        Social Determinants of Health (SDOH) Interventions     Readmission Risk Interventions     No data to display

## 2022-02-03 NOTE — Plan of Care (Signed)
  Problem: Nutrition: Goal: Adequate nutrition will be maintained Outcome: Adequate for Discharge   Problem: Coping: Goal: Level of anxiety will decrease Outcome: Adequate for Discharge   Problem: Elimination: Goal: Will not experience complications related to bowel motility Outcome: Adequate for Discharge Goal: Will not experience complications related to urinary retention Outcome: Adequate for Discharge   Problem: Skin Integrity: Goal: Risk for impaired skin integrity will decrease Outcome: Adequate for Discharge

## 2022-02-03 NOTE — Discharge Summary (Addendum)
Discharge Summary  Michele Owens QIW:[id number] DOB: 05-29-1960  PCP: [name], MD  Admit date: 01/31/2022 Discharge date: 02/03/2022  30 Day Unplanned Readmission Risk Score    Flowsheet Row ED to Hosp-Admission (Current) from 01/31/2022 in [facility] 70M KIDNEY UNIT  30 Day Unplanned Readmission Risk Score (%) 23.96 Filed at 02/03/2022 0801       This score is the patient's risk of an unplanned readmission within 30 days of being discharged (0 -100%). The score is based on dignosis, age, lab data, medications, orders, and past utilization.   Low:  0-14.9   Medium: 15-21.9   High: 22-29.9   Extreme: 30 and above         Time spent:  22mns  Recommendations for Outpatient Follow-up:  F/u with PCP within a week  for hospital discharge follow up, repeat cbc/bmp at follow up, patient is advised to check her blood pressure at home and bring in record for pcp to review  Labs to follow up at hospital discharge follow up: final blood culture and urine culture result , no growth so far  No new meds Hold lisinopril for three days    Discharge Diagnoses:  Active Hospital Problems   Diagnosis Date Noted   Acute renal failure (ARF) ([facility] 02/01/2022   Dyslipidemia associated with type 2 diabetes mellitus ([facility] 04/24/2015    Priority: Medium    Controlled type 2 diabetes mellitus without complication, without long-term current use of insulin ([facility] 04/24/2015    Priority: Medium    Benign essential HTN 04/24/2015    Priority: Medium    Anxiety and depression 04/24/2015    Priority: Medium    Hypothyroidism 02/01/2022   Chronic pain disorder 02/01/2022   Chronic diastolic CHF (congestive heart failure) ([facility] 04/18/2020   Obesity, Class III, BMI 40-49.9 (morbid obesity) ([facility] [place]    Gout 09/23/2016    Resolved Hospital Problems  No resolved problems to display.    Discharge Condition: stable  Diet recommendation: heart healthy/carb modified  Filed Weights    01/31/22 2155 02/01/22 1828  Weight: 136.1 kg 127.6 kg    History of present illness:  H/o HTN, HLD, NIDDM2, dCHF, COPD on home oxygen 3liters presents to the hospital via EMS due to multiple complaints, she appears to be not a good historian, not sure history is reliable but per EDP note" Patient reports that she has not been feeling well for some days.  She has had nausea and vomiting, was prescribed an antiemetic by her doctor but the pharmacy did not have any.  Patient has been experiencing cough and congestion, feels more short of breath than usual.  She is normally on oxygen continuously at home.  She has been having some pain on the right side of her chest intermittently.  Patient reports diffuse abdominal pain.: per admitting HP, patient presents with " fever, nausea, sore throat" She told me she felt her stomach was hot before she came to the hospital.  Hospital Course:  Principal Problem:   Acute renal failure (ARF) ([facility] Active Problems:   Dyslipidemia associated with type 2 diabetes mellitus ([facility]   Controlled type 2 diabetes mellitus without complication, without long-term current use of insulin (HCC)   Anxiety and depression   Benign essential HTN   Gout   Obesity, Class III, BMI 40-49.9 (morbid obesity) ([facility]   Chronic diastolic CHF (congestive heart failure) (HCC)   Hypothyroidism   Chronic pain disorder  AKI on CKD3a, likely from dehydration from n/v  at home -Ct ab no obstructive uropathy, ua unremarkable -cr improved with brief hydration and holding diuretics -cr normalized, she felt better, she desires to go home, resume diuretics at discharge, f/u with pcp next week and repeat labs.    HTN: presents with hypotension, bp 77/48 in the ED Blood culture and urine culture no growth so far She received hydration , home bp stable on on coreg, lisinopril, demadex held, bp starts to trend up, resume coreg and diuretics,  hold lisinopril for another three days, patient is  advised to check her blood pressure 1-2 times a day, bring in record for pcp to review   Chronic dCHF Presents with dehydration, hypotension and aki She received brief hydration, diuretics held in the hospital, resume at discharge F/u with pcp     Noninsulin dependent DM2, controlled  a1c 6.1 Am glucose 96 home meds metformin held in the hospital, resumed at discharge      COPD on home o2  No wheezing, no cough Continue home meds   History multinodular goiter and Thyroid cancer s/p Thyroidectomy -Continue Synthroid 224 daily   Constipation:  Start stool softener      Morbid obesity: Body mass index is 46.81 kg/m.[place]     Discharge Exam: BP 135/80 (BP Location: Right Arm)   Pulse 100   Temp 98.5 F (36.9 C) (Oral)   Resp 18   Ht _0  (1.651 m)   Wt 127.6 kg   SpO2 96%   BMI 46.81 kg/m   General: NAD Cardiovascular: RRR Respiratory: normal respiratory effort     Discharge Instructions     Diet - low sodium heart healthy   Complete by: As directed    Carb modified   Increase activity slowly   Complete by: As directed       Allergies as of 02/03/2022       Reactions   Bee Venom Swelling, Other (See Comments)   "All over my body" (swelling)   Fioricet [butalbital-apap-caffeine] Nausea And Vomiting, Rash   Ibuprofen Rash, Other (See Comments)   Severe rash   Lamisil [terbinafine] Rash, Other (See Comments)   Pt states this causes her to "feel funny"   Nsaids Other (See Comments)   Per MD's orders         Medication List     TAKE these medications    acetaminophen 500 MG tablet Commonly known as: TYLENOL Take 1,000 mg by mouth every 6 (six) hours as needed for moderate pain or headache.   albuterol 108 (90 Base) MCG/ACT inhaler Commonly known as: VENTOLIN HFA Inhale 1-2 puffs into the lungs every 6 (six) hours as needed for wheezing or shortness of breath.   allopurinol 100 MG tablet Commonly known as: ZYLOPRIM Take 100 mg by mouth  daily.   Anoro Ellipta 62.5-25 MCG/ACT Aepb Generic drug: umeclidinium-vilanterol Inhale 1 puff into the lungs daily.   aspirin EC 81 MG tablet Take 81 mg by mouth daily. Swallow whole.   blood glucose meter kit and supplies Kit Dispense based on patient and insurance preference. Use up to four times daily as directed.   busPIRone 5 MG tablet Commonly known as: BUSPAR Take 5 mg by mouth 3 (three) times daily.   Butalbital-APAP-Caffeine 50-300-40 MG Caps Take 1 capsule by mouth 2 (two) times daily as needed for headache.   calcitRIOL 0.5 MCG capsule Commonly known as: ROCALTROL Take 1 capsule (0.5 mcg total) by mouth daily.   carvedilol 6.25 MG tablet Commonly known as:  COREG Take 1 tablet (6.25 mg total) by mouth 2 (two) times daily with a meal.   clonazePAM 2 MG tablet Commonly known as: KLONOPIN Take 1 tablet (2 mg total) by mouth 3 (three) times daily. for anxiety What changed: additional instructions   cyclobenzaprine 10 MG tablet Commonly known as: FLEXERIL Take 10 mg by mouth 3 (three) times daily as needed for muscle spasms.   dicyclomine 20 MG tablet Commonly known as: BENTYL Take 1 tablet (20 mg total) by mouth 2 (two) times daily as needed for spasms (abdominal cramping).   EPINEPHrine 0.3 mg/0.3 mL Soaj injection Commonly known as: EPI-PEN Inject 0.3 mg into the muscle once as needed for anaphylaxis.   fenofibrate 145 MG tablet Commonly known as: TRICOR Take 145 mg by mouth daily.   FeroSul 325 (65 FE) MG tablet Generic drug: ferrous sulfate Take 325 mg by mouth daily.   glucose blood test strip Use as instructed to check blood sugar up to 4 times daily What changed:  how much to take how to take this when to take this   ipratropium-albuterol 0.5-2.5 (3) MG/3ML Soln Commonly known as: DUONEB Take 3 mLs by nebulization 3 (three) times daily.   levocetirizine 5 MG tablet Commonly known as: XYZAL Take 5 mg by mouth at bedtime.   levothyroxine  112 MCG tablet Commonly known as: SYNTHROID Take 224 mcg by mouth daily before breakfast.   lisinopril 5 MG tablet Commonly known as: ZESTRIL Take 1 tablet (5 mg total) by mouth daily. Please hold this medication for three days, please check your blood pressure at home, bring in record for your pcp to review What changed: additional instructions   loperamide 2 MG capsule Commonly known as: IMODIUM Take 2-4 mg by mouth as needed for diarrhea or loose stools. Take 4 mg after first loose bowel movement, then take 2 mg after each loose bowel movement. Do not exceed 6 capsules per 24 hours.   loratadine 10 MG tablet Commonly known as: CLARITIN Take 10 mg by mouth daily as needed for allergies.   metFORMIN 500 MG tablet Commonly known as: Glucophage Take 1 tablet (500 mg total) by mouth 2 (two) times daily with a meal.   nitroGLYCERIN 0.4 MG SL tablet Commonly known as: NITROSTAT Place 1 tablet (0.4 mg total) under the tongue every 5 (five) minutes as needed for chest pain.   omeprazole 20 MG capsule Commonly known as: PRILOSEC Take 20 mg by mouth 2 (two) times daily as needed (acid reflux).   ondansetron 4 MG tablet Commonly known as: ZOFRAN Take 4 mg by mouth every 8 (eight) hours as needed for nausea or vomiting.   oxyCODONE-acetaminophen 10-325 MG tablet Commonly known as: PERCOCET Take 1 tablet by mouth every 6 (six) hours as needed for pain.   OXYGEN Inhale 3 L into the lungs continuous.   phentermine 37.5 MG tablet Commonly known as: ADIPEX-P Take 37.5 mg by mouth daily.   polyethylene glycol 17 g packet Commonly known as: MIRALAX / GLYCOLAX Take 17 g by mouth daily as needed for mild constipation.   potassium chloride SA 20 MEQ tablet Commonly known as: KLOR-CON M Take 1 tablet (20 mEq total) by mouth 2 (two) times daily.   rosuvastatin 10 MG tablet Commonly known as: CRESTOR Take 10 mg by mouth every evening.   senna-docusate 8.6-50 MG tablet Commonly known  as: Senokot-S Take 1 tablet by mouth daily.   torsemide 20 MG tablet Commonly known as: DEMADEX TAKE 2  TABLETS (40 MG TOTAL) BY MOUTH 2 (TWO) TIMES DAILY. What changed: See the new instructions.   traZODone 50 MG tablet Commonly known as: DESYREL Take 100 mg by mouth at bedtime.   triamcinolone cream 0.1 % Commonly known as: KENALOG Apply 1 application. topically 2 (two) times daily as needed (irrritation).   valACYclovir 1000 MG tablet Commonly known as: VALTREX Take 1,000 mg by mouth daily.       Allergies  Allergen Reactions   Bee Venom Swelling and Other (See Comments)    "All over my body" (swelling)   Fioricet [Butalbital-Apap-Caffeine] Nausea And Vomiting and Rash   Ibuprofen Rash and Other (See Comments)    Severe rash   Lamisil [Terbinafine] Rash and Other (See Comments)    Pt states this causes her to "feel funny"   Nsaids Other (See Comments)    Per MD's orders     Follow-up Information     [name], MD Follow up in 1 week(s).   Specialty: Internal Medicine Why: hospital discharge follow up, repeat lab works including cbc/bmp please check your blood pressure at home 1- 2 times a day, bring in record for your pcp to review Contact information: [address] [address] [phone]                  The results of significant diagnostics from this hospitalization (including imaging, microbiology, ancillary and laboratory) are listed below for reference.    Significant Diagnostic Studies: CT ABDOMEN PELVIS WO CONTRAST  Result Date: 02/01/2022 CLINICAL DATA:  Abdominal pain EXAM: CT ABDOMEN AND PELVIS WITHOUT CONTRAST TECHNIQUE: Multidetector CT imaging of the abdomen and pelvis was performed following the standard protocol without IV contrast. RADIATION DOSE REDUCTION: This exam was performed according to the departmental dose-optimization program which includes automated exposure control, adjustment of the mA and/or kV  according to patient size and/or use of iterative reconstruction technique. COMPARISON:  06/09/2021 FINDINGS: Lower chest: Scattered areas of discoid atelectasis noted within both lung bases. Hepatobiliary: There is diffuse hepatic steatosis. No focal liver abnormality noted. Gallbladder appears normal. No bile duct dilatation. Pancreas: Unremarkable. No pancreatic ductal dilatation or surrounding inflammatory changes. Spleen: Normal in size without focal abnormality. Adrenals/Urinary Tract: Normal adrenal glands. No nephrolithiasis, hydronephrosis, or mass identified. Urinary bladder is unremarkable. Stomach/Bowel: Stomach appears normal. No bowel wall thickening, inflammation or distension. The appendix is visualized and is within normal limits. Vascular/Lymphatic: Aortic atherosclerosis. No signs of abdominopelvic adenopathy. Reproductive: Status post hysterectomy. No adnexal masses. Other: No free fluid or fluid collections. No signs of pneumoperitoneum. Musculoskeletal: No acute or significant osseous findings. IMPRESSION: 1. No acute findings within the abdomen or pelvis. 2. Hepatic steatosis. 3.  Aortic Atherosclerosis (ICD10-I70.0). Electronically Signed   By: Kerby Moors M.D.   On: 02/01/2022 07:43   DG Chest 1 View  Result Date: 01/31/2022 CLINICAL DATA:  Chest pain. Fever, chills, sore throat, and nausea for 2 days. EXAM: CHEST  1 VIEW COMPARISON:  11/11/2021 FINDINGS: Shallow inspiration. Cardiac enlargement. Prominent central pulmonary vascularity with perihilar and basilar interstitial infiltration. This may represent edema or multifocal pneumonia. Linear atelectasis in the lung bases. No pleural effusions. No pneumothorax. IMPRESSION: Cardiac enlargement with mild vascular congestion and perihilar/basilar infiltration. Linear atelectasis in the lung bases. Electronically Signed   By: Lucienne Capers M.D.   On: 01/31/2022 22:35    Microbiology: Recent Results (from the past 240 hour(s))   Resp Panel by RT-PCR (Flu A&B, Covid) Anterior Nasal  Swab     Status: None   Collection Time: 02/01/22  4:11 AM   Specimen: Anterior Nasal Swab  Result Value Ref Range Status   SARS Coronavirus 2 by RT PCR NEGATIVE NEGATIVE Final    Comment: (NOTE) SARS-CoV-2 target nucleic acids are NOT DETECTED.  The SARS-CoV-2 RNA is generally detectable in upper respiratory specimens during the acute phase of infection. The lowest concentration of SARS-CoV-2 viral copies this assay can detect is 138 copies/mL. A negative result does not preclude SARS-Cov-2 infection and should not be used as the sole basis for treatment or other patient management decisions. A negative result may occur with  improper specimen collection/handling, submission of specimen other than nasopharyngeal swab, presence of viral mutation(s) within the areas targeted by this assay, and inadequate number of viral copies(<138 copies/mL). A negative result must be combined with clinical observations, patient history, and epidemiological information. The expected result is Negative.  Fact Sheet for Patients:  EntrepreneurPulse.com.au  Fact Sheet for Healthcare Providers:  IncredibleEmployment.be  This test is no t yet approved or cleared by the [place] FDA and  has been authorized for detection and/or diagnosis of SARS-CoV-2 by FDA under an Emergency Use Authorization (EUA). This EUA will remain  in effect (meaning this test can be used) for the duration of the COVID-19 declaration under Section 564(b)(1) of the Act, 21 U.S.C.section 360bbb-3(b)(1), unless the authorization is terminated  or revoked sooner.       Influenza A by PCR NEGATIVE NEGATIVE Final   Influenza B by PCR NEGATIVE NEGATIVE Final    Comment: (NOTE) The Xpert Xpress SARS-CoV-2/FLU/RSV plus assay is intended as an aid in the diagnosis of influenza from Nasopharyngeal swab specimens and should not be used as a  sole basis for treatment. Nasal washings and aspirates are unacceptable for Xpert Xpress SARS-CoV-2/FLU/RSV testing.  Fact Sheet for Patients: EntrepreneurPulse.com.au  Fact Sheet for Healthcare Providers: IncredibleEmployment.be  This test is not yet approved or cleared by the [place] FDA and has been authorized for detection and/or diagnosis of SARS-CoV-2 by FDA under an Emergency Use Authorization (EUA). This EUA will remain in effect (meaning this test can be used) for the duration of the COVID-19 declaration under Section 564(b)(1) of the Act, 21 U.S.C. section 360bbb-3(b)(1), unless the authorization is terminated or revoked.  Performed at [facility], [place] [address], [place], [place] [address]      Labs: Basic Metabolic Panel: Recent Labs  Lab 01/31/22 2210 02/02/22 0458 02/03/22 0550  NA 135 142 141  K 5.4* 4.7 4.3  CL 96* 104 103  CO2 _0 GLUCOSE 109* 96 90  BUN 48* 26* 11  CREATININE 3.04* 1.21* 0.87  CALCIUM 8.1* 8.5* 9.0  MG  --   --  2.0   Liver Function Tests: Recent Labs  Lab 01/31/22 2210  AST 23  ALT 22  ALKPHOS 45  BILITOT 0.3  PROT 6.5  ALBUMIN 3.5   Recent Labs  Lab 01/31/22 2210  LIPASE 28   No results for input(s): "AMMONIA" in the last 168 hours. CBC: Recent Labs  Lab 01/31/22 2210 02/02/22 0458 02/03/22 0550  WBC 16.6* 10.8* 10.2  NEUTROABS 12.7*  --  7.0  HGB 9.6* 9.3* 9.6*  HCT 30.2* 29.8* 30.1*  MCV 99.3 99.0 97.1  PLT 308 276 279   Cardiac Enzymes: No results for input(s): "CKTOTAL", "CKMB", "CKMBINDEX", "TROPONINI" in the last 168 hours. BNP: BNP (last 3 results) Recent Labs  08/30/21 0520 09/14/21 2121 11/11/21 1443  BNP 26.8 12.7 21.9    ProBNP (last 3 results) No results for input(s): "PROBNP" in the last 8760 hours.  CBG: Recent Labs  Lab 02/02/22 0716 02/02/22 1124 02/02/22 1636 02/02/22 2200 02/03/22 0746  GLUCAP 96 83 97 100* 93     FURTHER DISCHARGE INSTRUCTIONS:   Get Medicines reviewed and adjusted: Please take all your medications with you for your next visit with your Primary MD   Laboratory/radiological data: Please request your Primary MD to go over all hospital tests and procedure/radiological results at the follow up, please ask your Primary MD to get all Hospital records sent to his/her office.   In some cases, they will be blood work, cultures and biopsy results pending at the time of your discharge. Please request that your primary care M.D. goes through all the records of your hospital data and follows up on these results.   Also Note the following: If you experience worsening of your admission symptoms, develop shortness of breath, life threatening emergency, suicidal or homicidal thoughts you must seek medical attention immediately by calling 911 or calling your MD immediately  if symptoms less severe.   You must read complete instructions/literature along with all the possible adverse reactions/side effects for all the Medicines you take and that have been prescribed to you. Take any new Medicines after you have completely understood and accpet all the possible adverse reactions/side effects.    Do not drive when taking Pain medications or sleeping medications (Benzodaizepines)   Do not take more than prescribed Pain, Sleep and Anxiety Medications. It is not advisable to combine anxiety,sleep and pain medications without talking with your primary care practitioner   Special Instructions: If you have smoked or chewed Tobacco  in the last 2 yrs please stop smoking, stop any regular Alcohol  and or any Recreational drug use.   Wear Seat belts while driving.   Please note: You were cared for by a hospitalist during your hospital stay. Once you are discharged, your primary care physician will handle any further medical issues. Please note that NO REFILLS for any discharge medications will be authorized  once you are discharged, as it is imperative that you return to your primary care physician (or establish a relationship with a primary care physician if you do not have one) for your post hospital discharge needs so that they can reassess your need for medications and monitor your lab values.     Signed:  Florencia Reasons MD, PhD, FACP  [facility] 02/03/2022, 9:35 AM

## 2022-02-06 ENCOUNTER — Telehealth: Payer: Self-pay

## 2022-02-06 DIAGNOSIS — I110 Hypertensive heart disease with heart failure: Secondary | ICD-10-CM | POA: Diagnosis not present

## 2022-02-06 NOTE — Patient Outreach (Signed)
Care Coordination  02/06/2022  Rossie Scarfone 1960-08-25 [id number]   Transition Care Management Unsuccessful Follow-up Telephone Call  Date of discharge and from where:  02/03/22 [facility]  Attempts:  1st Attempt  Reason for unsuccessful TCM follow-up call:  Left voice message   Mickel Fuchs, BSW, [place] Medicaid Team  [phone]

## 2022-02-07 DIAGNOSIS — I110 Hypertensive heart disease with heart failure: Secondary | ICD-10-CM | POA: Diagnosis not present

## 2022-02-08 DIAGNOSIS — Z419 Encounter for procedure for purposes other than remedying health state, unspecified: Secondary | ICD-10-CM | POA: Diagnosis not present

## 2022-02-08 DIAGNOSIS — I110 Hypertensive heart disease with heart failure: Secondary | ICD-10-CM | POA: Diagnosis not present

## 2022-02-09 DIAGNOSIS — I110 Hypertensive heart disease with heart failure: Secondary | ICD-10-CM | POA: Diagnosis not present

## 2022-02-10 DIAGNOSIS — I110 Hypertensive heart disease with heart failure: Secondary | ICD-10-CM | POA: Diagnosis not present

## 2022-02-10 DIAGNOSIS — E119 Type 2 diabetes mellitus without complications: Secondary | ICD-10-CM | POA: Diagnosis not present

## 2022-02-10 DIAGNOSIS — E753 Sphingolipidosis, unspecified: Secondary | ICD-10-CM | POA: Diagnosis not present

## 2022-02-10 DIAGNOSIS — E039 Hypothyroidism, unspecified: Secondary | ICD-10-CM | POA: Diagnosis not present

## 2022-02-13 DIAGNOSIS — I110 Hypertensive heart disease with heart failure: Secondary | ICD-10-CM | POA: Diagnosis not present

## 2022-02-14 DIAGNOSIS — I110 Hypertensive heart disease with heart failure: Secondary | ICD-10-CM | POA: Diagnosis not present

## 2022-02-15 DIAGNOSIS — I110 Hypertensive heart disease with heart failure: Secondary | ICD-10-CM | POA: Diagnosis not present

## 2022-02-16 DIAGNOSIS — I110 Hypertensive heart disease with heart failure: Secondary | ICD-10-CM | POA: Diagnosis not present

## 2022-02-17 DIAGNOSIS — I110 Hypertensive heart disease with heart failure: Secondary | ICD-10-CM | POA: Diagnosis not present

## 2022-02-20 DIAGNOSIS — I110 Hypertensive heart disease with heart failure: Secondary | ICD-10-CM | POA: Diagnosis not present

## 2022-02-21 DIAGNOSIS — I110 Hypertensive heart disease with heart failure: Secondary | ICD-10-CM | POA: Diagnosis not present

## 2022-02-22 DIAGNOSIS — I110 Hypertensive heart disease with heart failure: Secondary | ICD-10-CM | POA: Diagnosis not present

## 2022-02-23 DIAGNOSIS — I110 Hypertensive heart disease with heart failure: Secondary | ICD-10-CM | POA: Diagnosis not present

## 2022-02-24 DIAGNOSIS — I110 Hypertensive heart disease with heart failure: Secondary | ICD-10-CM | POA: Diagnosis not present

## 2022-02-26 IMAGING — DX DG CHEST 1V PORT
1 series · 1 of 1 positions shown · non-contrast
Comparison: Radiograph 01/01/2019. CT 06/09/2018

CLINICAL DATA: Shortness of breath. Chest pain.

EXAM:
PORTABLE CHEST 1 VIEW

[chest ap]
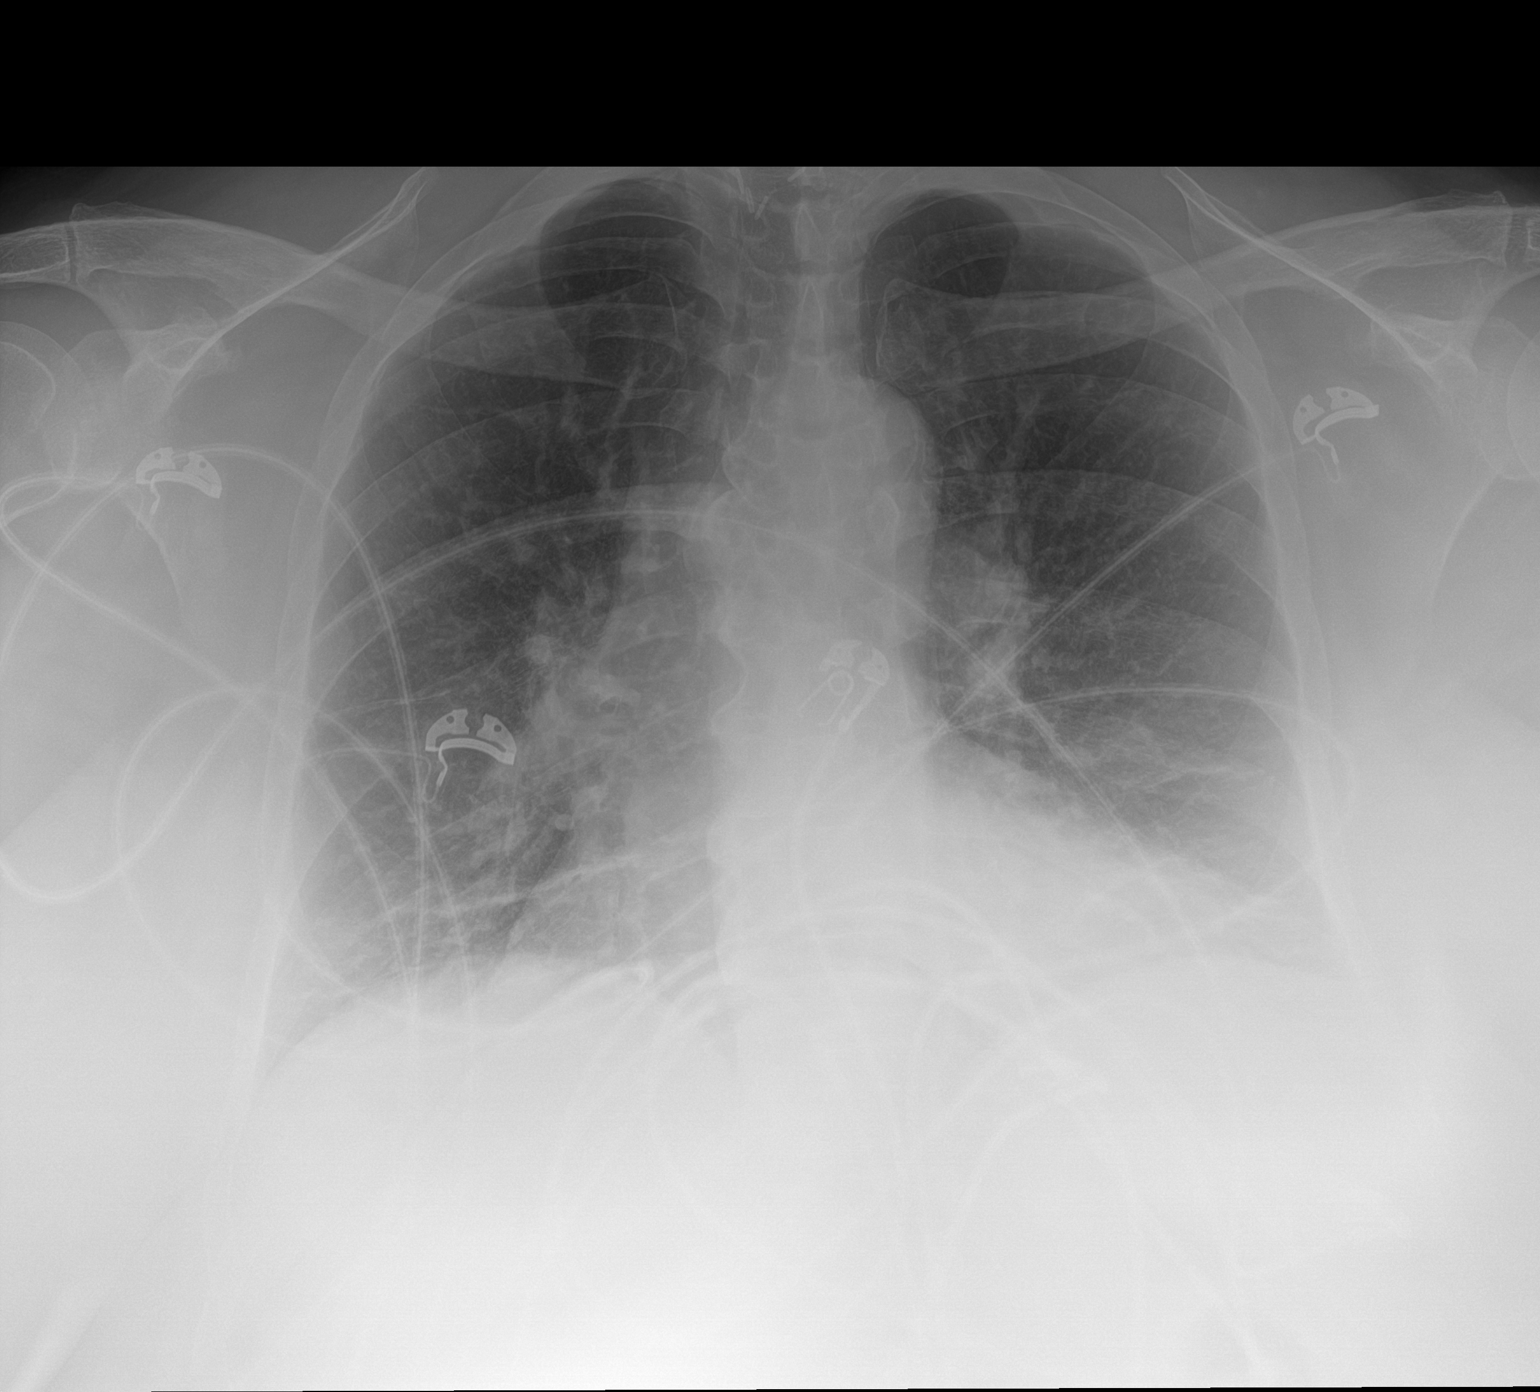

[1 of 1 positions shown; findings below may reference images not displayed]

FINDINGS: Low lung volumes. Unchanged cardiomegaly. Unchanged mediastinal
contours with bilateral hilar prominence related to prominent
pulmonary arteries. Streaky bibasilar opacities likely atelectasis
or scarring. No confluent airspace disease. No pulmonary edema. No
pleural effusion or pneumothorax. No acute osseous abnormalities are
seen. Surgical clips at the thoracic inlet.
IMPRESSION: 1. Low lung volumes with bibasilar atelectasis or scarring.
2. Stable cardiomegaly.

## 2022-02-27 DIAGNOSIS — I110 Hypertensive heart disease with heart failure: Secondary | ICD-10-CM | POA: Diagnosis not present

## 2022-02-28 DIAGNOSIS — I110 Hypertensive heart disease with heart failure: Secondary | ICD-10-CM | POA: Diagnosis not present

## 2022-03-01 DIAGNOSIS — I110 Hypertensive heart disease with heart failure: Secondary | ICD-10-CM | POA: Diagnosis not present

## 2022-03-02 DIAGNOSIS — I110 Hypertensive heart disease with heart failure: Secondary | ICD-10-CM | POA: Diagnosis not present

## 2022-03-03 DIAGNOSIS — I110 Hypertensive heart disease with heart failure: Secondary | ICD-10-CM | POA: Diagnosis not present

## 2022-03-03 IMAGING — CT CT ANGIO CHEST-ABD-PELV FOR DISSECTION W/ AND WO/W CM
2 of 7 series · 15 of 46 positions shown, 17 images · IV contrast (APPLIED)
Comparison: Chest x-ray June 25, 2019. CT angiogram of the chest
June 09, 2018. CT of the abdomen June 06, 2017.

CLINICAL DATA: Chest pain radiating to arms and legs with nausea.
No vomiting.

EXAM:
CT ANGIOGRAPHY CHEST, ABDOMEN AND PELVIS
TECHNIQUE: Multidetector CT imaging through the chest, abdomen and pelvis was
performed using the standard protocol during bolus administration of
intravenous contrast. Multiplanar reconstructed images and MIPs were
obtained and reviewed to evaluate the vascular anatomy.
CONTRAST:  100mL OMNIPAQUE IOHEXOL 350 MG/ML SOLN

[Series 6: arterial · axial · arterial · 0.93mm/px · z∈[+799,+1379]mm · 12 of 326 slices shown, 14 images]
[im 18/326  soft-tissue]
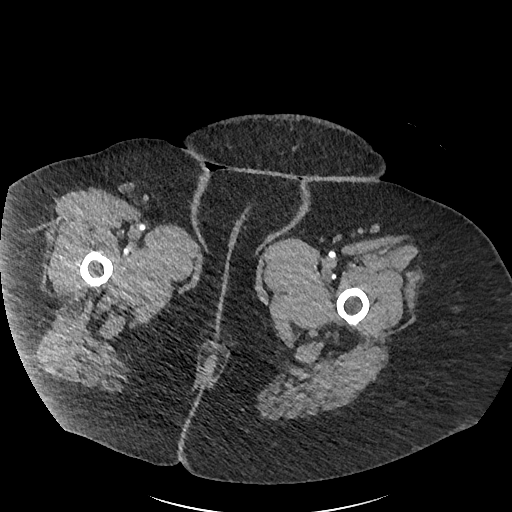
[im 18/326  bone]
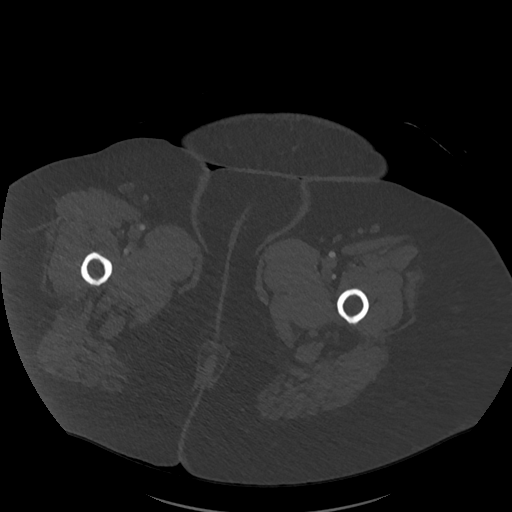
[im 52/326  soft-tissue]
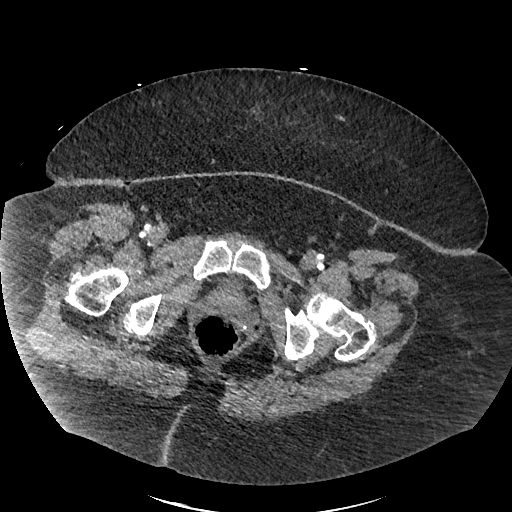
[im 69/326  soft-tissue]
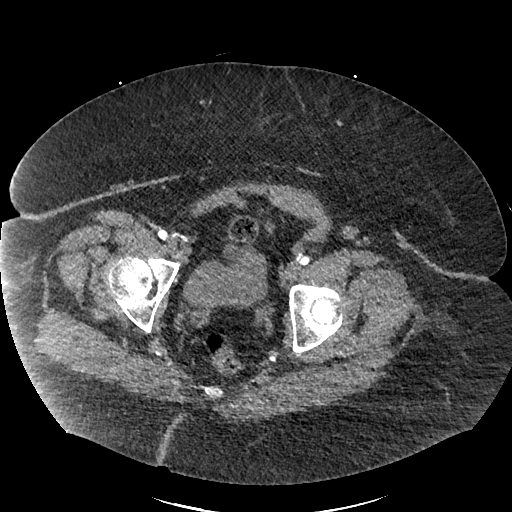
[im 103/326  soft-tissue]
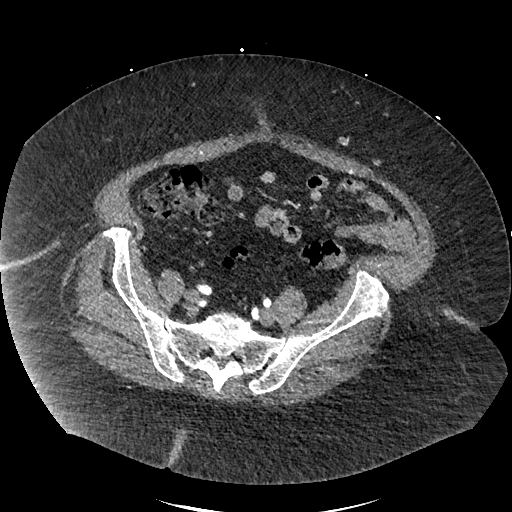
[im 120/326  soft-tissue]
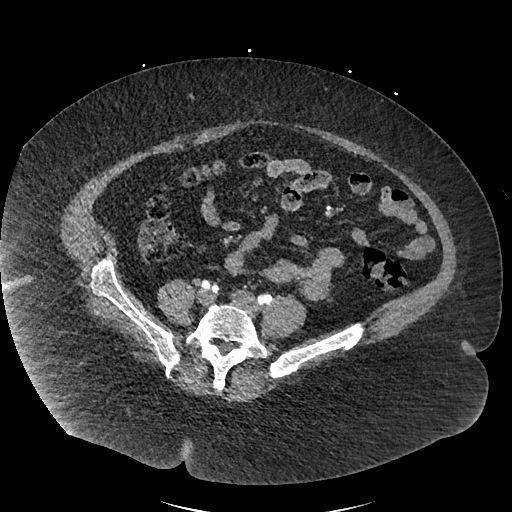
[im 154/326  soft-tissue]
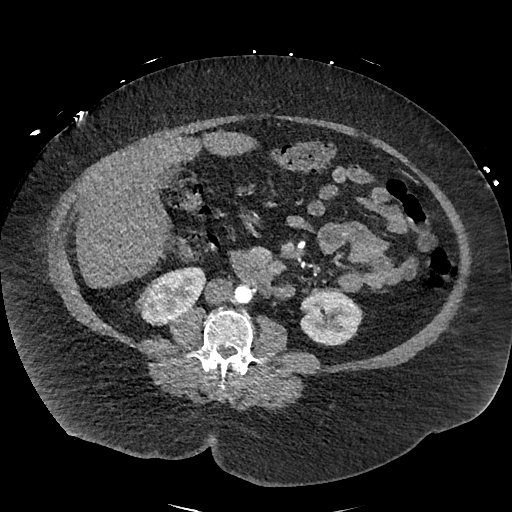
[im 172/326  soft-tissue]
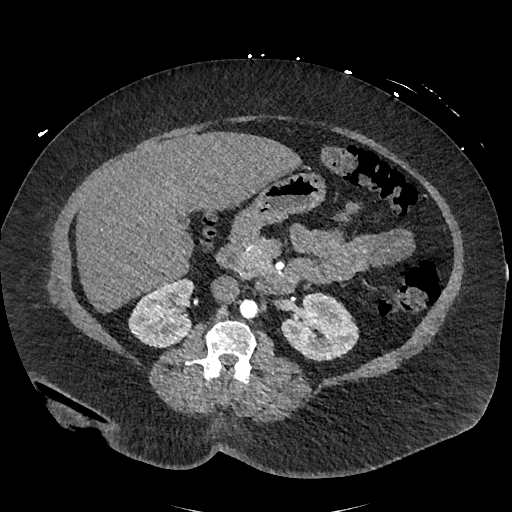
[im 206/326  soft-tissue]
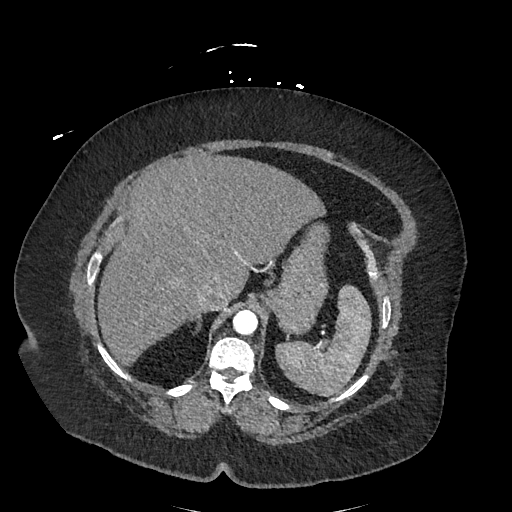
[im 223/326  soft-tissue]
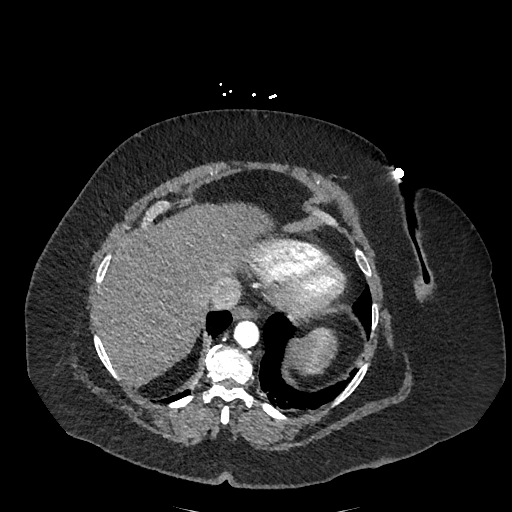
[im 223/326  bone]
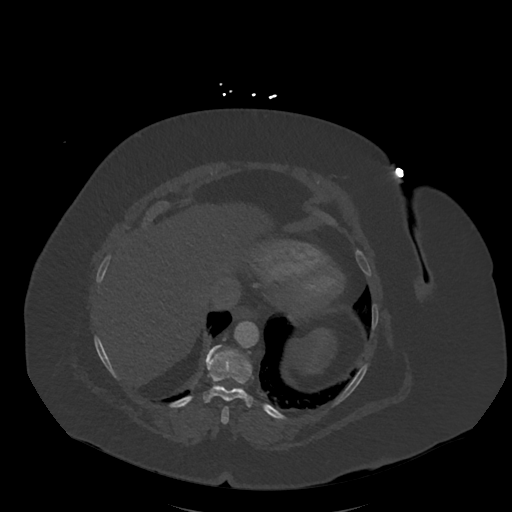
[im 257/326  soft-tissue]
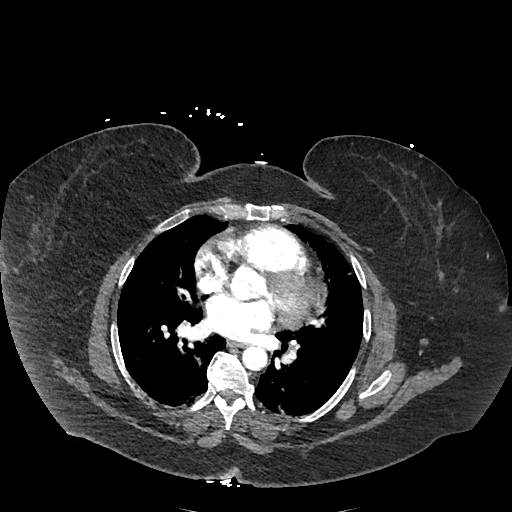
[im 274/326  soft-tissue]
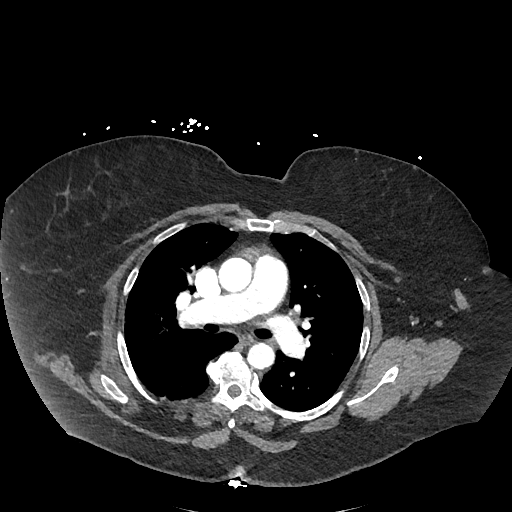
[im 308/326  soft-tissue]
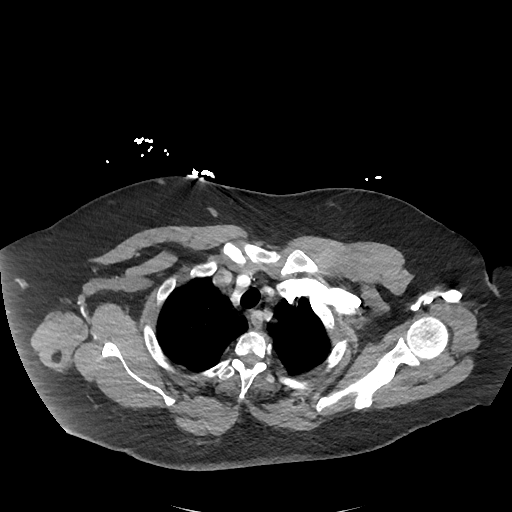

[Series 9: cor · coronal · 0.98mm/px · 3 of 216 slices shown]
[im 54/216  soft-tissue]
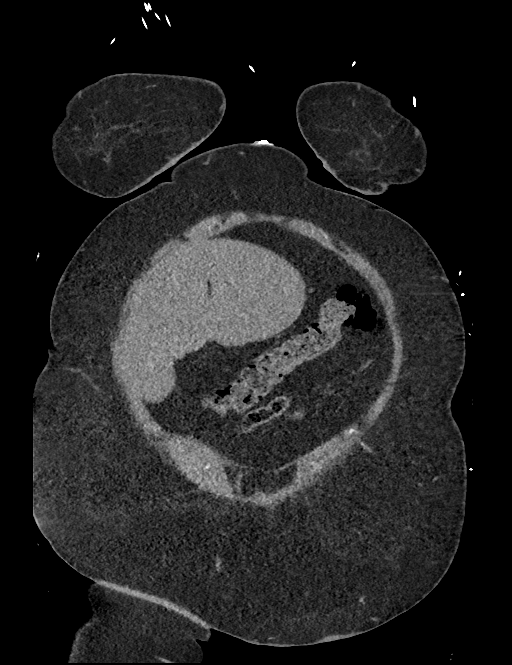
[im 108/216  soft-tissue]
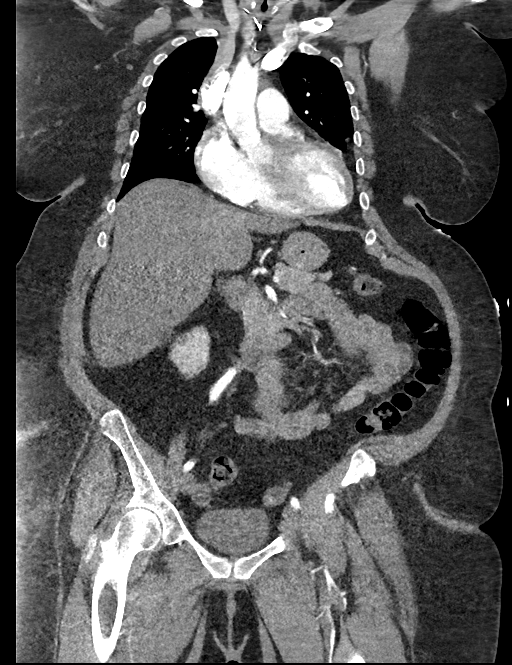
[im 162/216  soft-tissue]
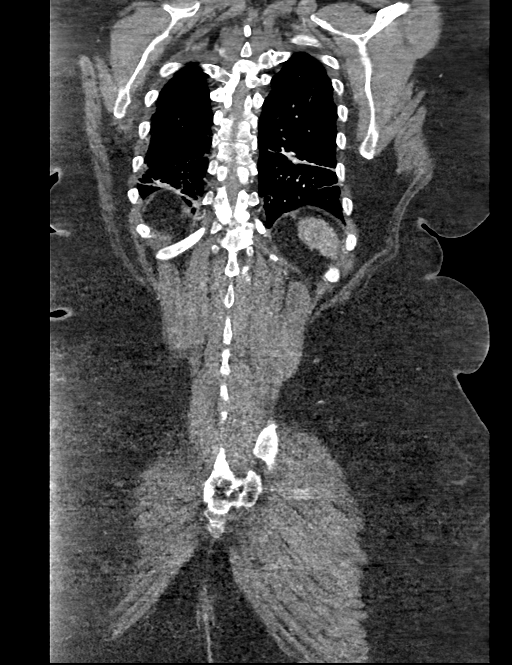

[15 of 46 positions shown; findings below may reference images not displayed]

FINDINGS: CTA CHEST FINDINGS

Cardiovascular: Atherosclerotic change is seen in the thoracic aorta
without aneurysm or dissection. The main pulmonary artery is normal
in caliber. No central filling defects in the pulmonary arteries.
Cardiomegaly.

Mediastinum/Nodes: The patient appears to be status post
thyroidectomy. The esophagus is normal. Chest wall is normal. A
mildly prominent node in the lateral right axilla is stable. No
adenopathy. No pleural or pericardial effusions.

Lungs/Pleura: Central airways are normal. No pneumothorax. Bibasilar
atelectasis. No suspicious infiltrates. No nodules or masses.

Musculoskeletal: No chest wall abnormality. No acute or significant
osseous findings.

Review of the MIP images confirms the above findings.

CTA ABDOMEN AND PELVIS FINDINGS

VASCULAR

Aorta: Normal caliber aorta without aneurysm, dissection, vasculitis
or significant stenosis.

Celiac: Patent without evidence of aneurysm, dissection, vasculitis
or significant stenosis.

SMA: Patent without evidence of aneurysm, dissection, vasculitis or
significant stenosis.

Renals: Both renal arteries are patent without evidence of aneurysm,
dissection, vasculitis, fibromuscular dysplasia or significant
stenosis.

IMA: Patent without evidence of aneurysm, dissection, vasculitis or
significant stenosis.

Inflow: Patent without evidence of aneurysm, dissection, vasculitis
or significant stenosis.

Veins: No obvious venous abnormality within the limitations of this
arterial phase study.

Review of the MIP images confirms the above findings.

NON-VASCULAR

Hepatobiliary: Evaluation is limited due to arterial phase imaging.
No abnormalities are identified.

Pancreas: Evaluation is limited due to arterial phase imaging with
no identified abnormalities.

Spleen: Normal in size without focal abnormality.

Adrenals/Urinary Tract: Adrenal glands are unremarkable. Kidneys are
normal, without renal calculi, focal lesion, or hydronephrosis.
Bladder is unremarkable.

Stomach/Bowel: Stomach is within normal limits. Appendix appears
normal. No evidence of bowel wall thickening, distention, or
inflammatory changes.

Lymphatic: No adenopathy identified.

Reproductive: Status post hysterectomy. No adnexal masses.

Other: No abdominal wall hernia or abnormality. No abdominopelvic
ascites.

Musculoskeletal: No acute or significant osseous findings.

Review of the MIP images confirms the above findings.
IMPRESSION: 1. Mild atherosclerotic changes in the thoracic aorta. No aortic
aneurysm or dissection. Branching vessels are unremarkable.
2. No other abnormalities are identified to explain the patient's
symptoms.

## 2022-03-03 IMAGING — CT CT HEAD W/O CM
4 series · 16 of 47 positions shown, 18 images · non-contrast
Comparison: 10/30/2018

CLINICAL DATA: Headache

EXAM:
CT HEAD WITHOUT CONTRAST
TECHNIQUE: Contiguous axial images were obtained from the base of the skull
through the vertex without intravenous contrast.

[Series 2: head without · axial · non-contrast · 0.47mm/px · z∈[-134,-9]mm · 7 of 35 slices shown, 9 images]
[im 5/35  brain]
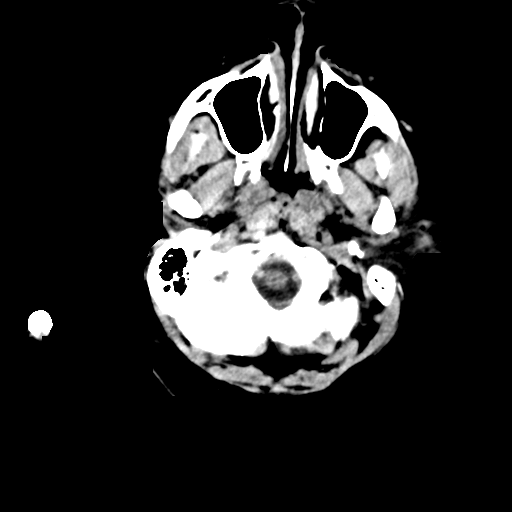
[im 5/35  bone]
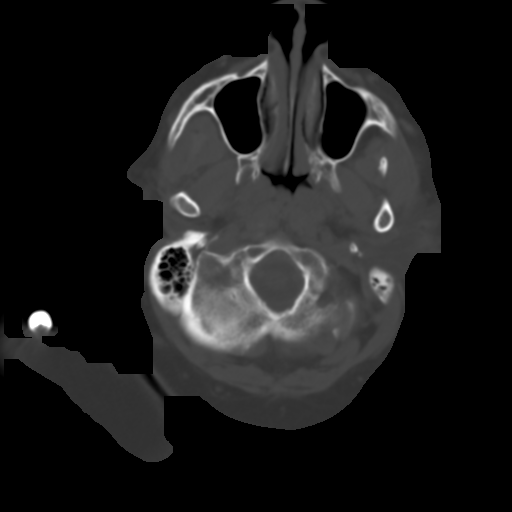
[im 9/35  brain]
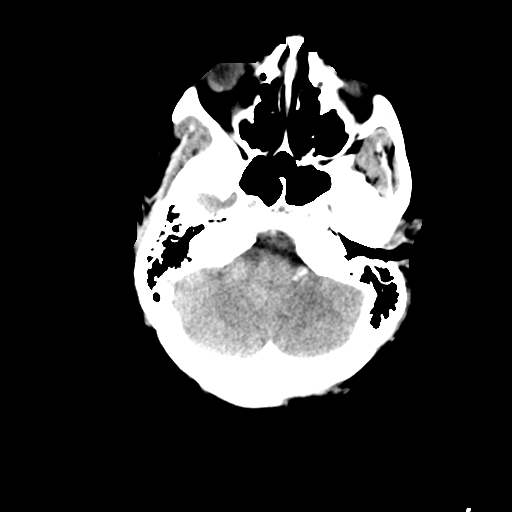
[im 13/35  brain]
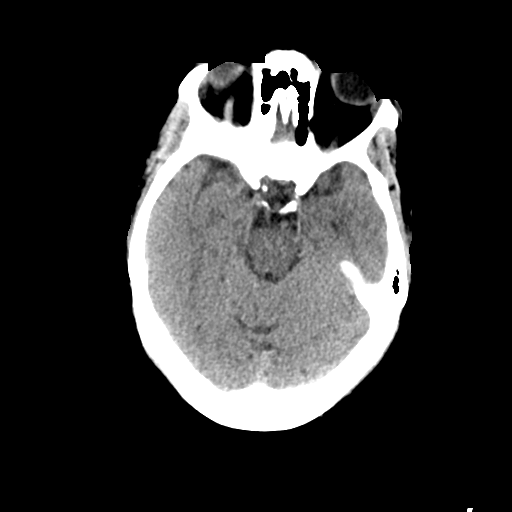
[im 18/35  brain]
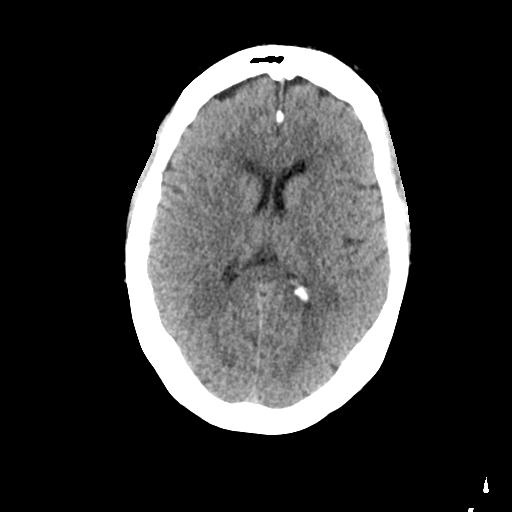
[im 22/35  brain]
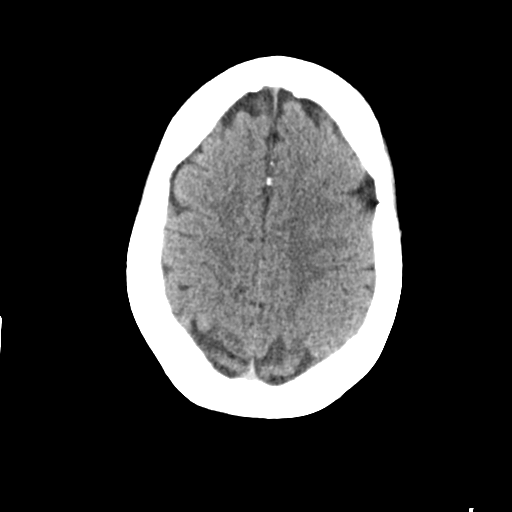
[im 22/35  bone]
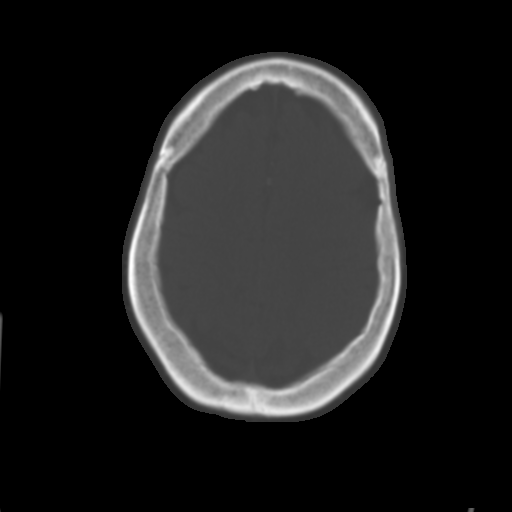
[im 26/35  brain]
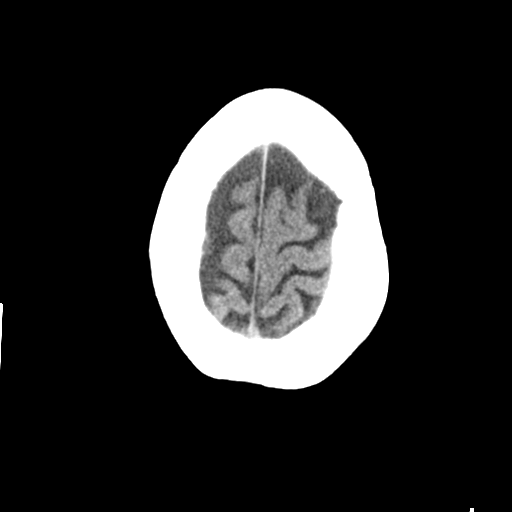
[im 30/35  brain]
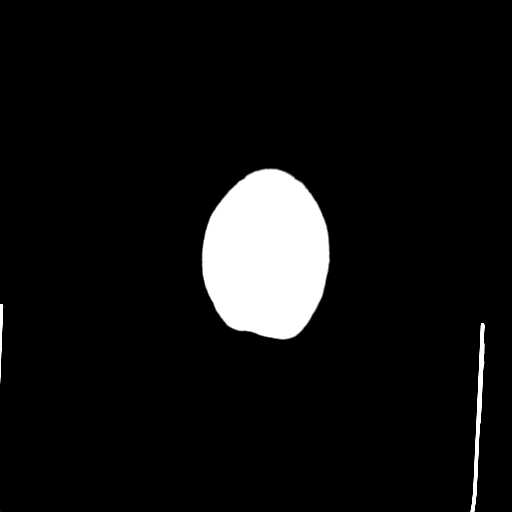

[Series 3: head bone · axial · 0.47mm/px · z∈[-138,-104]mm · 3 of 86 slices shown]
[im 9/86  bone]
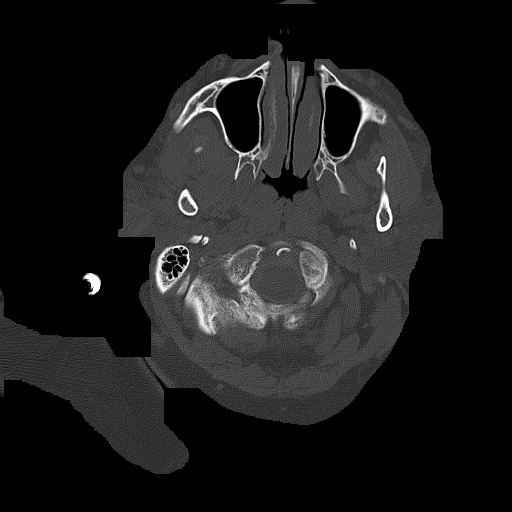
[im 18/86  bone]
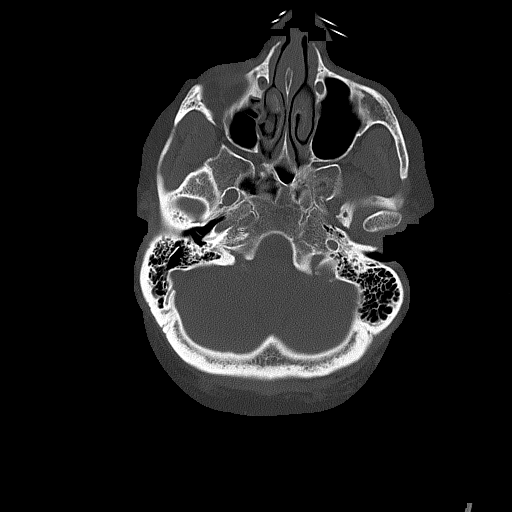
[im 26/86  bone]
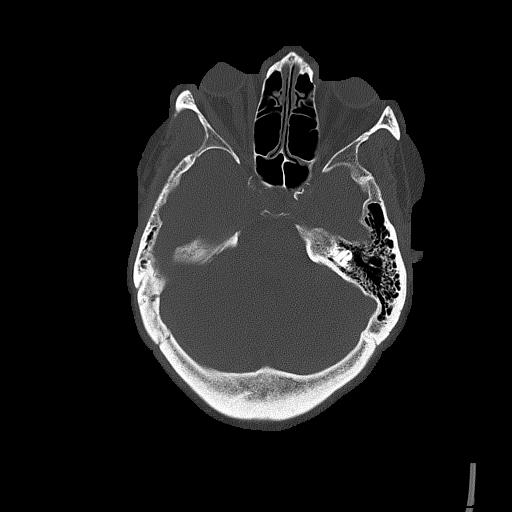

[Series 4: head without cor · coronal · non-contrast · 0.33mm/px · 3 of 72 slices shown]
[im 24/72  brain]
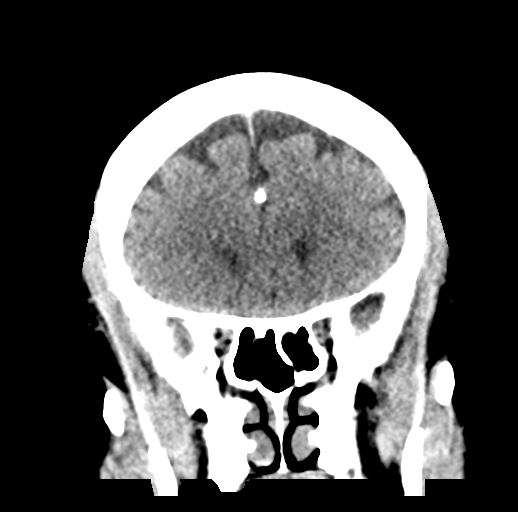
[im 32/72  brain]
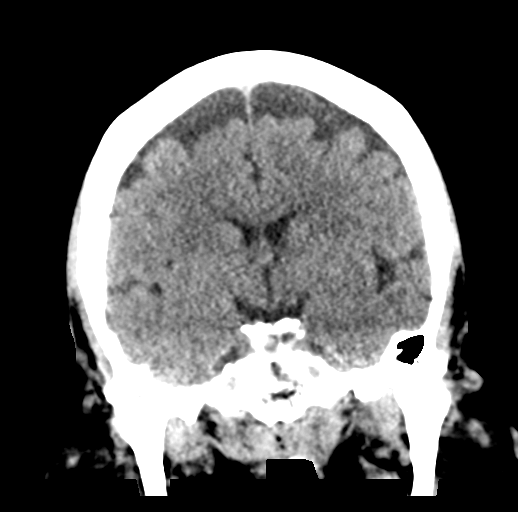
[im 40/72  brain]
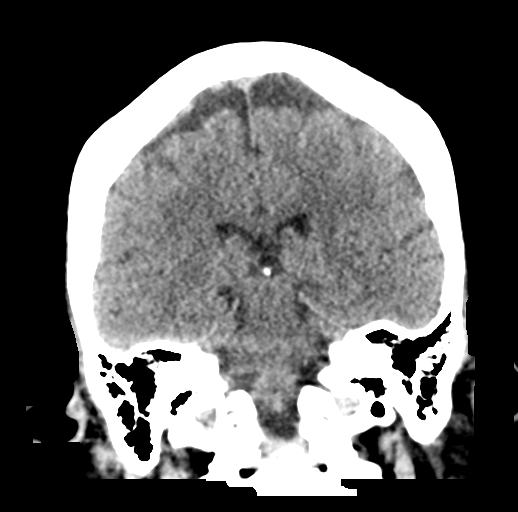

[Series 5: head without sag · sagittal · non-contrast · 0.33mm/px · 3 of 64 slices shown]
[im 22/64  brain]
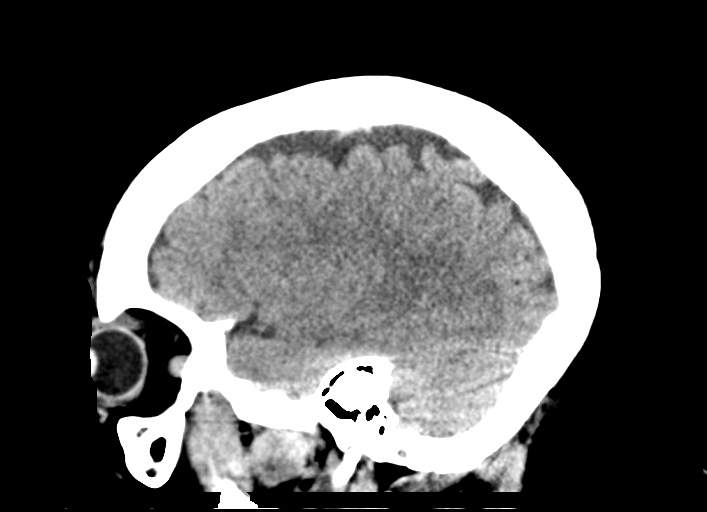
[im 32/64  brain]
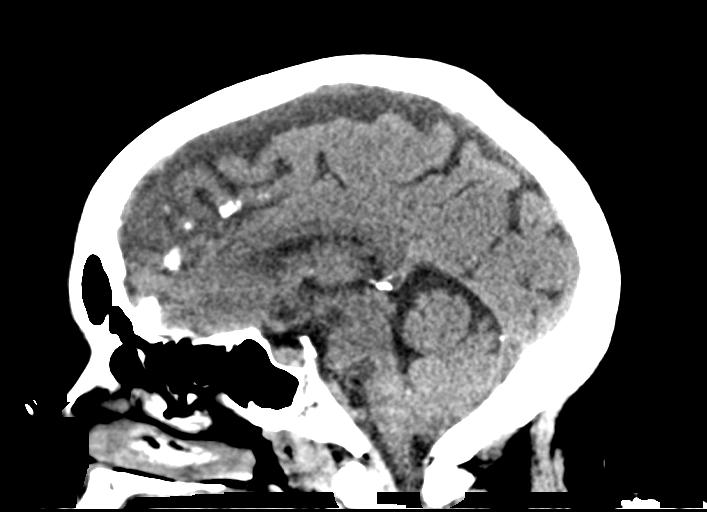
[im 43/64  brain]
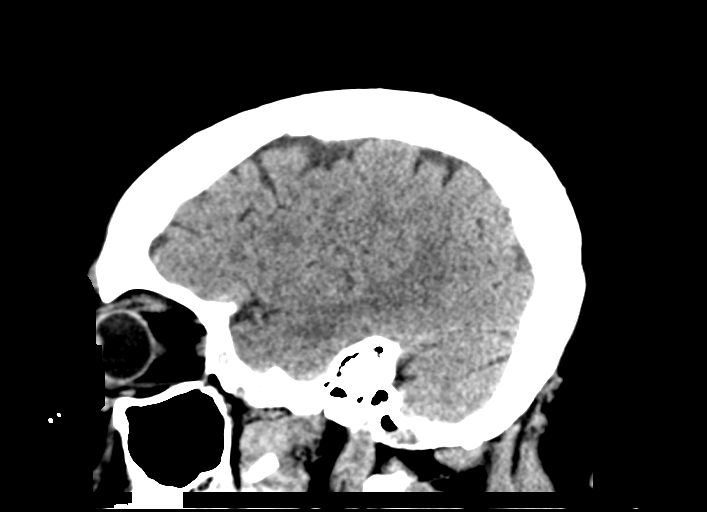

[16 of 47 positions shown; findings below may reference images not displayed]

FINDINGS: Brain: No acute intracranial abnormality. Specifically, no
hemorrhage, hydrocephalus, mass lesion, acute infarction, or
significant intracranial injury.

Vascular: No hyperdense vessel or unexpected calcification.

Skull: No acute calvarial abnormality.

Sinuses/Orbits: Visualized paranasal sinuses and mastoids clear.
Orbital soft tissues unremarkable.

Other: None
IMPRESSION: Normal study.

## 2022-03-06 DIAGNOSIS — I110 Hypertensive heart disease with heart failure: Secondary | ICD-10-CM | POA: Diagnosis not present

## 2022-03-07 DIAGNOSIS — I110 Hypertensive heart disease with heart failure: Secondary | ICD-10-CM | POA: Diagnosis not present

## 2022-03-08 ENCOUNTER — Other Ambulatory Visit: Payer: Self-pay

## 2022-03-08 ENCOUNTER — Emergency Department (HOSPITAL_COMMUNITY)
Admission: EM | Admit: 2022-03-08 | Discharge: 2022-03-09 | Disposition: A | Discharge status: Home / Self Care | Payer: Medicaid Other | Attending: Emergency Medicine | Admitting: Emergency Medicine

## 2022-03-08 ENCOUNTER — Encounter (HOSPITAL_COMMUNITY): Payer: Self-pay | Admitting: *Deleted

## 2022-03-08 DIAGNOSIS — R1030 Lower abdominal pain, unspecified: Secondary | ICD-10-CM | POA: Diagnosis not present

## 2022-03-08 DIAGNOSIS — R509 Fever, unspecified: Secondary | ICD-10-CM | POA: Diagnosis not present

## 2022-03-08 DIAGNOSIS — J449 Chronic obstructive pulmonary disease, unspecified: Secondary | ICD-10-CM | POA: Insufficient documentation

## 2022-03-08 DIAGNOSIS — E119 Type 2 diabetes mellitus without complications: Secondary | ICD-10-CM | POA: Insufficient documentation

## 2022-03-08 DIAGNOSIS — Z87891 Personal history of nicotine dependence: Secondary | ICD-10-CM | POA: Diagnosis not present

## 2022-03-08 DIAGNOSIS — R7989 Other specified abnormal findings of blood chemistry: Secondary | ICD-10-CM | POA: Diagnosis not present

## 2022-03-08 DIAGNOSIS — J45909 Unspecified asthma, uncomplicated: Secondary | ICD-10-CM | POA: Diagnosis not present

## 2022-03-08 DIAGNOSIS — I5032 Chronic diastolic (congestive) heart failure: Secondary | ICD-10-CM | POA: Diagnosis not present

## 2022-03-08 DIAGNOSIS — Z85850 Personal history of malignant neoplasm of thyroid: Secondary | ICD-10-CM | POA: Insufficient documentation

## 2022-03-08 DIAGNOSIS — R109 Unspecified abdominal pain: Secondary | ICD-10-CM | POA: Diagnosis not present

## 2022-03-08 DIAGNOSIS — R1084 Generalized abdominal pain: Secondary | ICD-10-CM | POA: Diagnosis not present

## 2022-03-08 DIAGNOSIS — R58 Hemorrhage, not elsewhere classified: Secondary | ICD-10-CM | POA: Diagnosis not present

## 2022-03-08 DIAGNOSIS — I110 Hypertensive heart disease with heart failure: Secondary | ICD-10-CM | POA: Diagnosis not present

## 2022-03-08 DIAGNOSIS — R0689 Other abnormalities of breathing: Secondary | ICD-10-CM | POA: Diagnosis not present

## 2022-03-08 DIAGNOSIS — Z743 Need for continuous supervision: Secondary | ICD-10-CM | POA: Diagnosis not present

## 2022-03-08 DIAGNOSIS — T68XXXA Hypothermia, initial encounter: Secondary | ICD-10-CM | POA: Diagnosis not present

## 2022-03-08 NOTE — ED Triage Notes (Signed)
The pt arrived by gems from home  the pt  told ems that she had a lot of blood coming from her lower abd  ems reports no blood seen when she  inspected the area

## 2022-03-09 ENCOUNTER — Emergency Department (HOSPITAL_COMMUNITY): Payer: Medicaid Other

## 2022-03-09 DIAGNOSIS — R109 Unspecified abdominal pain: Secondary | ICD-10-CM | POA: Diagnosis not present

## 2022-03-09 DIAGNOSIS — I110 Hypertensive heart disease with heart failure: Secondary | ICD-10-CM | POA: Diagnosis not present

## 2022-03-09 DIAGNOSIS — Z7689 Persons encountering health services in other specified circumstances: Secondary | ICD-10-CM | POA: Diagnosis not present

## 2022-03-09 LAB — CBC WITH DIFFERENTIAL/PLATELET
Abs Immature Granulocytes: 0.12 10*3/uL — ABNORMAL HIGH (ref 0.00–0.07)
Basophils Absolute: 0.1 10*3/uL (ref 0.0–0.1)
Basophils Relative: 0 %
Eosinophils Absolute: 0.4 10*3/uL (ref 0.0–0.5)
Eosinophils Relative: 3 %
HCT: 31.6 % — ABNORMAL LOW (ref 36.0–46.0)
Hemoglobin: 9.7 g/dL — ABNORMAL LOW (ref 12.0–15.0)
Immature Granulocytes: 1 %
Lymphocytes Relative: 17 %
Lymphs Abs: 2.3 10*3/uL (ref 0.7–4.0)
MCH: 30.7 pg (ref 26.0–34.0)
MCHC: 30.7 g/dL (ref 30.0–36.0)
MCV: 100 fL (ref 80.0–100.0)
Monocytes Absolute: 0.8 10*3/uL (ref 0.1–1.0)
Monocytes Relative: 6 %
Neutro Abs: 9.7 10*3/uL — ABNORMAL HIGH (ref 1.7–7.7)
Neutrophils Relative %: 73 %
Platelets: 354 10*3/uL (ref 150–400)
RBC: 3.16 MIL/uL — ABNORMAL LOW (ref 3.87–5.11)
RDW: 13.6 % (ref 11.5–15.5)
WBC: 13.4 10*3/uL — ABNORMAL HIGH (ref 4.0–10.5)
nRBC: 0 % (ref 0.0–0.2)

## 2022-03-09 LAB — COMPREHENSIVE METABOLIC PANEL
ALT: 16 U/L (ref 0–44)
AST: 21 U/L (ref 15–41)
Albumin: 3.6 g/dL (ref 3.5–5.0)
Alkaline Phosphatase: 43 U/L (ref 38–126)
Anion gap: 11 (ref 5–15)
BUN: 33 mg/dL — ABNORMAL HIGH (ref 8–23)
CO2: 33 mmol/L — ABNORMAL HIGH (ref 22–32)
Calcium: 9.4 mg/dL (ref 8.9–10.3)
Chloride: 95 mmol/L — ABNORMAL LOW (ref 98–111)
Creatinine, Ser: 1.55 mg/dL — ABNORMAL HIGH (ref 0.44–1.00)
GFR, Estimated: 38 mL/min — ABNORMAL LOW (ref >60)
Glucose, Bld: 100 mg/dL — ABNORMAL HIGH (ref 70–99)
Potassium: 4.3 mmol/L (ref 3.5–5.1)
Sodium: 139 mmol/L (ref 135–145)
Total Bilirubin: 0.4 mg/dL (ref 0.3–1.2)
Total Protein: 7.2 g/dL (ref 6.5–8.1)

## 2022-03-09 LAB — LIPASE, BLOOD: Lipase: 37 U/L (ref 11–51)

## 2022-03-09 LAB — LACTIC ACID, PLASMA: Lactic Acid, Venous: 1 mmol/L (ref 0.5–1.9)

## 2022-03-09 MED ORDER — DICYCLOMINE HCL 20 MG PO TABS: 20.0000 mg | ORAL_TABLET | Freq: Two times a day (BID) | ORAL | 0 refills | Status: DC

## 2022-03-09 MED ORDER — IOHEXOL 350 MG/ML SOLN
75.0000 mL | Freq: Once | INTRAVENOUS | Status: AC | PRN
  Administered 2022-03-09: 75 mL via INTRAVENOUS

## 2022-03-09 MED ORDER — HYDROMORPHONE HCL 1 MG/ML IJ SOLN
1.0000 mg | Freq: Once | INTRAMUSCULAR | Status: AC
  Administered 2022-03-09: 1 mg via INTRAVENOUS
  Filled 2022-03-09: qty 1

## 2022-03-09 MED ORDER — LOPERAMIDE HCL 2 MG PO CAPS: 2.0000 mg | ORAL_CAPSULE | Freq: Four times a day (QID) | ORAL | 0 refills | Status: DC | PRN

## 2022-03-09 MED ORDER — SODIUM CHLORIDE 0.9 % IV BOLUS
1000.0000 mL | Freq: Once | INTRAVENOUS | Status: AC
  Administered 2022-03-09: 1000 mL via INTRAVENOUS

## 2022-03-09 MED ORDER — ONDANSETRON HCL 4 MG/2ML IJ SOLN
4.0000 mg | Freq: Once | INTRAMUSCULAR | Status: AC
  Administered 2022-03-09: 4 mg via INTRAVENOUS
  Filled 2022-03-09: qty 2

## 2022-03-09 NOTE — ED Notes (Signed)
Patient transported to CT 

## 2022-03-09 NOTE — Discharge Instructions (Signed)
You were evaluated in the Emergency Department and after careful evaluation, we did not find any emergent condition requiring admission or further testing in the hospital.  Your exam/testing today is overall reassuring.  Use the Bentyl medication as needed for abdominal pain.  Use the Imodium as needed for diarrhea.  Please return to the Emergency Department if you experience any worsening of your condition.   Thank you for allowing [place] to be a part of your care.

## 2022-03-09 NOTE — ED Provider Notes (Signed)
[facility] Emergency Department Provider Note MRN:  [id number]  Arrival date & time: 03/09/22     Chief Complaint   Abdominal Pain   History of Present Illness   Michele Owens is a 60 y.o. year-old female with a history of CHF, COPD presenting to the ED with chief complaint of abdominal pain.  Severe lower abdominal pain for the past day or 2.  Also endorses fever at home.  Frequent bowel movements.  No vomiting.  Review of Systems  A thorough review of systems was obtained and all systems are negative except as noted in the HPI and PMH.   Patient's Health History    Past Medical History:  Diagnosis Date   Anxiety    Arthritis    Asthma    Chronic diastolic CHF (congestive heart failure) (HCC)    COPD (chronic obstructive pulmonary disease) (HCC)    Uses Oxygen at night   Depression    Diabetes mellitus without complication (HCC)    GERD (gastroesophageal reflux disease)    Gout    Headache    migraines   History of DVT of lower extremity    Hypertension    Thyroid cancer ([place]) 09/23/2016    Past Surgical History:  Procedure Laterality Date   BUNIONECTOMY Bilateral    CARDIAC CATHETERIZATION N/A 06/23/2015   Procedure: Right/Left Heart Cath and Coronary Angiography;  Surgeon: [name], MD;  Location: [place] [facility];  Service: Cardiovascular;  Laterality: N/A;   COLONOSCOPY WITH PROPOFOL N/A 12/15/2015   Procedure: COLONOSCOPY WITH PROPOFOL;  Surgeon: [name], MD;  Location: [place];  Service: Endoscopy;  Laterality: N/A;   RIGHT HEART CATH N/A 10/01/2019   Procedure: RIGHT HEART CATH;  Surgeon: [name], MD;  Location: [place] [facility];  Service: Cardiovascular;  Laterality: N/A;   THYROIDECTOMY  09/19/2016   THYROIDECTOMY N/A 09/19/2016   Procedure: TOTAL THYROIDECTOMY;  Surgeon: [name], MD;  Location: [place];  Service: General;  Laterality: N/A;   TONSILLECTOMY     TOTAL ABDOMINAL HYSTERECTOMY  07/14/10     Family History  Problem Relation Age of Onset   Cancer Father        thought to be due to exposure to concrete   Diabetes Mother    Thyroid cancer Mother        dx in her 64s-60s   Cancer Maternal Uncle        2 uncles with cancer NOS   Brain cancer Paternal Aunt    Cancer Cousin        maternal first cousin - NOS   Cancer Cousin        maternal first cousin - NOS    Social History   Socioeconomic History   Marital status: Single    Spouse name: Not on file   Number of children: Not on file   Years of education: Not on file   Highest education level: Not on file  Occupational History   Occupation: disabled  Tobacco Use   Smoking status: Former    Packs/day: 0.50    Years: 41.00    Total pack years: 20.50    Types: Cigarettes    Quit date: 07/2016    Years since quitting: 5.6   Smokeless tobacco: Never  Vaping Use   Vaping Use: Never used  Substance and Sexual Activity   Alcohol use: Not Currently    Comment: quit 12 years ago   Drug use: Not Currently  Types: Marijuana, Cocaine    Comment: quit 12 years ago   Sexual activity: Yes  Other Topics Concern   Not on file  Social History Narrative   Not on file   Social Determinants of Health   Financial Resource Strain: Low Risk  (02/01/2021)   Overall Financial Resource Strain (CARDIA)    Difficulty of Paying Living Expenses: Not very hard  Food Insecurity: No Food Insecurity (01/26/2021)   Hunger Vital Sign    Worried About Running Out of Food in the Last Year: Never true    Ran Out of Food in the Last Year: Never true  Transportation Needs: Unmet Transportation Needs (03/21/2021)   PRAPARE - Transportation    Lack of Transportation (Medical): Yes    Lack of Transportation (Non-Medical): Yes  Physical Activity: Inactive (02/01/2021)   Exercise Vital Sign    Days of Exercise per Week: 0 days    Minutes of Exercise per Session: 0 min  Stress: Stress Concern Present (02/23/2021)   [place]    Feeling of Stress : To some extent  Social Connections: Moderately Isolated (01/27/2021)   Social Connection and Isolation Panel [NHANES]    Frequency of Communication with Friends and Family: More than three times a week    Frequency of Social Gatherings with Friends and Family: More than three times a week    Attends Religious Services: 1 to 4 times per year    Active Member of [employer] or Organizations: No    Attends Archivist Meetings: Never    Marital Status: Divorced  Human resources officer Violence: Not At Risk (02/23/2021)   Humiliation, Afraid, Rape, and Kick questionnaire    Fear of Current or Ex-Partner: No    Emotionally Abused: No    Physically Abused: No    Sexually Abused: No     Physical Exam   Vitals:   03/09/22 0425 03/09/22 0430  BP:  (!) 98/52  Pulse:  (!) 115  Resp:  18  Temp: 98.7 F (37.1 C)   SpO2:  97%    CONSTITUTIONAL: Chronically ill-appearing, NAD NEURO/PSYCH:  Alert and oriented x 3, no focal deficits EYES:  eyes equal and reactive ENT/NECK:  no LAD, no JVD CARDIO: Regular rate, well-perfused, normal S1 and S2 PULM:  CTAB no wheezing or rhonchi GI/GU:  non-distended, moderate diffuse tenderness MSK/SPINE:  No gross deformities, no edema SKIN:  no rash, atraumatic   *Additional and/or pertinent findings included in MDM below  Diagnostic and Interventional Summary    EKG Interpretation  Date/Time:    Ventricular Rate:    PR Interval:    QRS Duration:   QT Interval:    QTC Calculation:   R Axis:     Text Interpretation:         Labs Reviewed  CBC WITH DIFFERENTIAL/PLATELET - Abnormal; Notable for the following components:      Result Value   WBC 13.4 (*)    RBC 3.16 (*)    Hemoglobin 9.7 (*)    HCT 31.6 (*)    Neutro Abs 9.7 (*)    Abs Immature Granulocytes 0.12 (*)    All other components within normal limits  COMPREHENSIVE METABOLIC PANEL - Abnormal; Notable  for the following components:   Chloride 95 (*)    CO2 33 (*)    Glucose, Bld 100 (*)    BUN 33 (*)    Creatinine, Ser 1.55 (*)    GFR,  Estimated 38 (*)    All other components within normal limits  CULTURE, BLOOD (ROUTINE X 2)  CULTURE, BLOOD (ROUTINE X 2) W REFLEX TO ID PANEL  LIPASE, BLOOD  LACTIC ACID, PLASMA    CT ABDOMEN PELVIS W CONTRAST  Final Result      Medications  ondansetron (ZOFRAN) injection 4 mg (4 mg Intravenous Given 03/09/22 0202)  HYDROmorphone (DILAUDID) injection 1 mg (1 mg Intravenous Given 03/09/22 0203)  sodium chloride 0.9 % bolus 1,000 mL (0 mLs Intravenous Stopped 03/09/22 0202)  iohexol (OMNIPAQUE) 350 MG/ML injection 75 mL (75 mLs Intravenous Contrast Given 03/09/22 0329)     Procedures  /  Critical Care Procedures  ED Course and Medical Decision Making  Initial Impression and Ddx Differential diagnosis includes appendicitis, diverticulitis, colitis, volvulus, intra-abdominal infection especially given patient's reported fever and her current hypotension here in the emergency department.  She seems overall well perfused, providing fluid bolus and will monitor closely.  Past medical/surgical history that increases complexity of ED encounter: None  Interpretation of Diagnostics I personally reviewed the laboratory assessment and my interpretation is as follows: Mild creatinine elevation, otherwise no significant blood count or electrolyte disturbance  CTs without acute process  Patient Reassessment and Ultimate Disposition/Management     Patient feeling a lot better, vital signs of overall that stable and reassuring, had some soft readings that are favored to be related to cuff size.  Not tachycardic, normal lactate, warm and well-perfused.  Patient is appropriate for discharge.  Patient management required discussion with the following services or consulting groups:  None  Complexity of Problems Addressed Acute illness or injury that poses  threat of life of bodily function  Additional Data Reviewed and Analyzed Further history obtained from: None  Additional Factors Impacting ED Encounter Risk Use of parenteral controlled substances  Barth Kirks. Sedonia Small, MD [place] [email or web address]  Final Clinical Impressions(s) / ED Diagnoses     ICD-10-CM   1. Lower abdominal pain  R10.30       ED Discharge Orders          Ordered    loperamide (IMODIUM) 2 MG capsule  4 times daily PRN        03/09/22 0500    dicyclomine (BENTYL) 20 MG tablet  2 times daily        03/09/22 0500             Discharge Instructions Discussed with and Provided to Patient:     Discharge Instructions      You were evaluated in the Emergency Department and after careful evaluation, we did not find any emergent condition requiring admission or further testing in the hospital.  Your exam/testing today is overall reassuring.  Use the Bentyl medication as needed for abdominal pain.  Use the Imodium as needed for diarrhea.  Please return to the Emergency Department if you experience any worsening of your condition.   Thank you for allowing [place] to be a part of your care.       Maudie Flakes, MD 03/09/22 [phone]

## 2022-03-09 NOTE — ED Notes (Signed)
PTAR Called 

## 2022-03-10 ENCOUNTER — Telehealth: Payer: Self-pay | Admitting: Licensed Clinical Social Worker

## 2022-03-10 DIAGNOSIS — I110 Hypertensive heart disease with heart failure: Secondary | ICD-10-CM | POA: Diagnosis not present

## 2022-03-10 DIAGNOSIS — Z419 Encounter for procedure for purposes other than remedying health state, unspecified: Secondary | ICD-10-CM | POA: Diagnosis not present

## 2022-03-10 IMAGING — CT CT ABD-PELV W/ CM
3 of 5 series · 17 of 46 positions shown, 19 images · IV contrast (APPLIED)
Comparison: 06/28/2019

CLINICAL DATA: Nonlocalized abdominal pain, nausea, weakness

EXAM:
CT ABDOMEN AND PELVIS WITH CONTRAST
TECHNIQUE: Multidetector CT imaging of the abdomen and pelvis was performed
using the standard protocol following bolus administration of
intravenous contrast.
CONTRAST:  125mL OMNIPAQUE IOHEXOL 300 MG/ML  SOLN

[Series 4: abdomen 5.0 · axial · 0.96mm/px · z∈[-468,-83]mm · 11 of 93 slices shown, 13 images]
[im 8/93  soft-tissue]
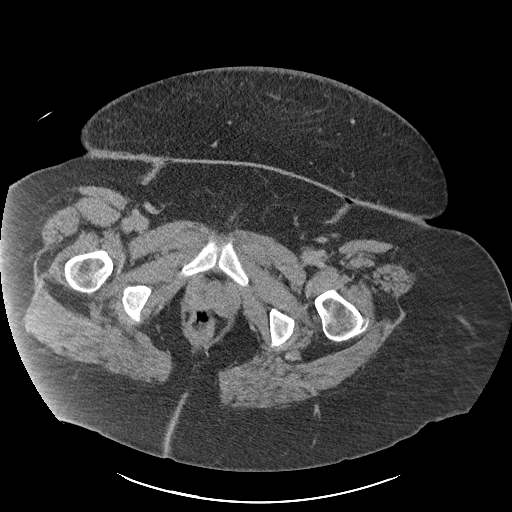
[im 8/93  bone]
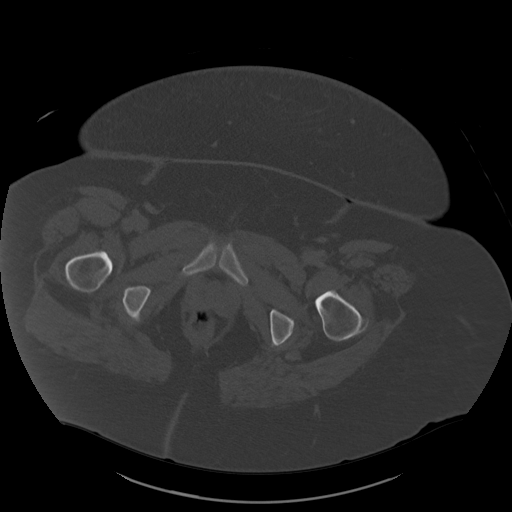
[im 16/93  soft-tissue]
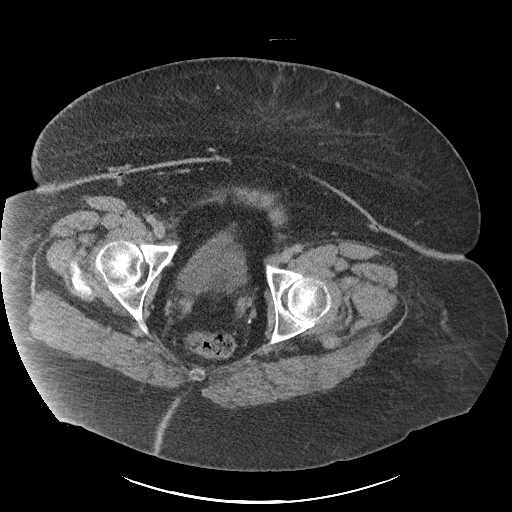
[im 24/93  soft-tissue]
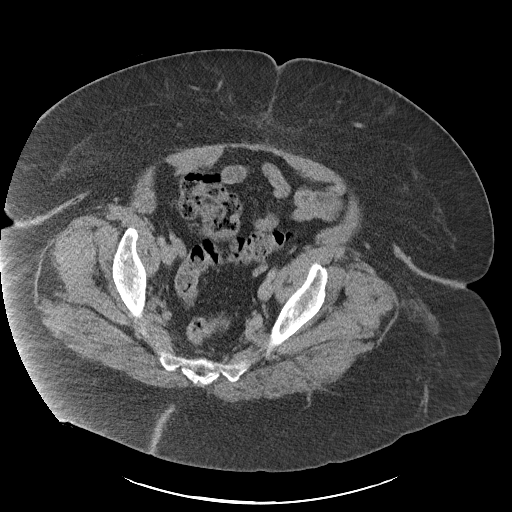
[im 31/93  soft-tissue]
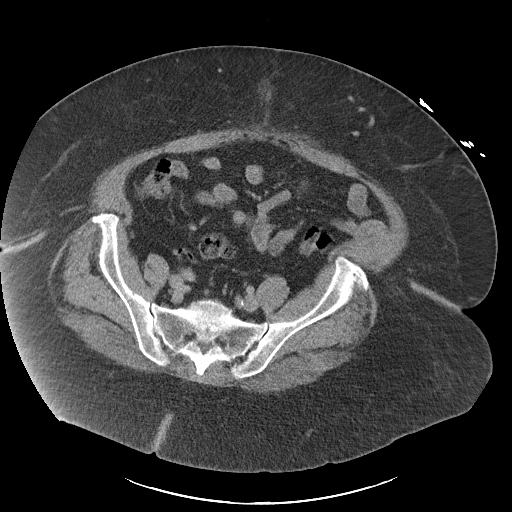
[im 39/93  soft-tissue]
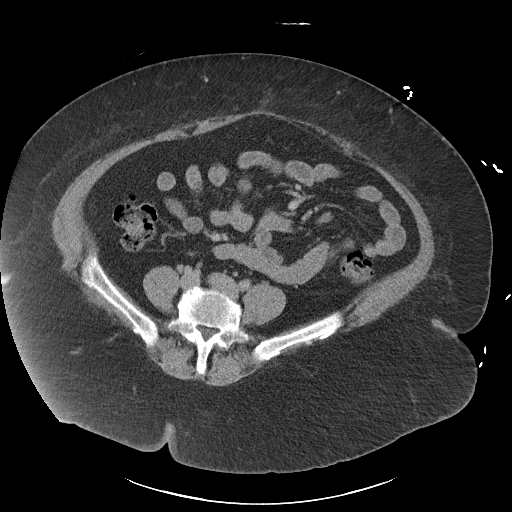
[im 47/93  soft-tissue]
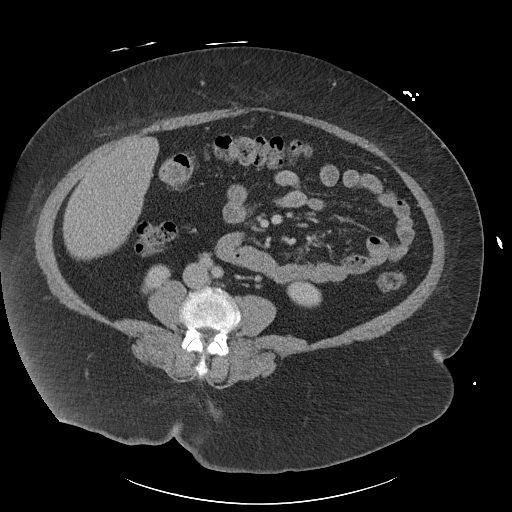
[im 54/93  soft-tissue]
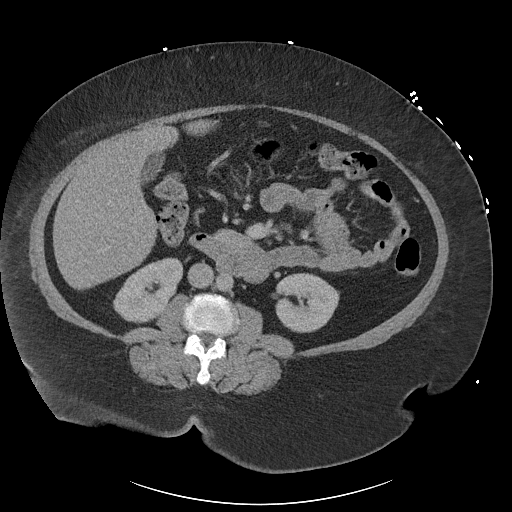
[im 62/93  soft-tissue]
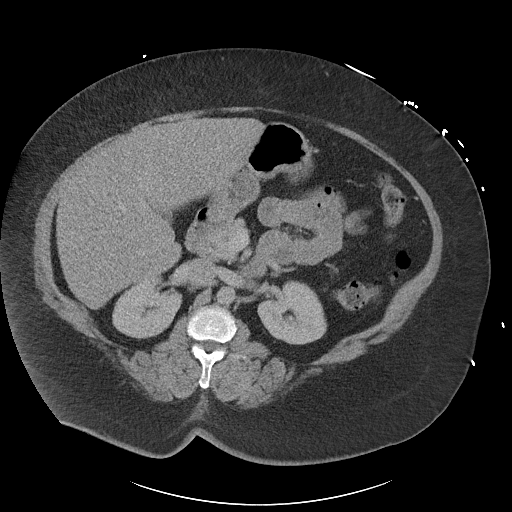
[im 70/93  soft-tissue]
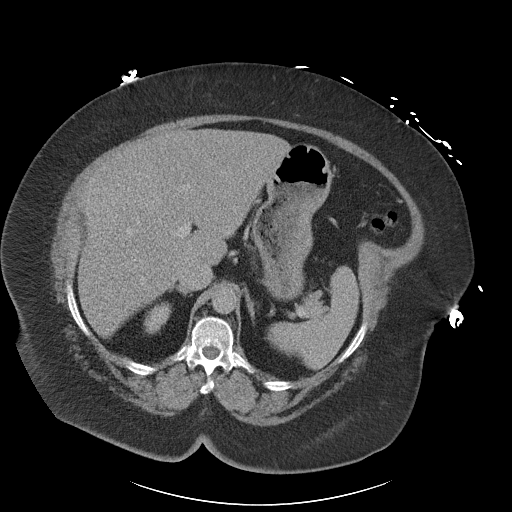
[im 70/93  bone]
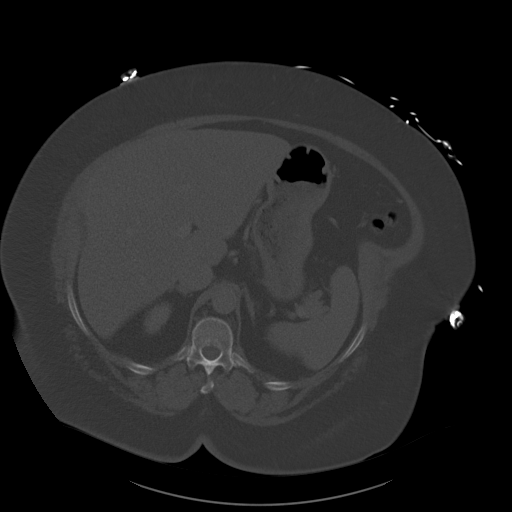
[im 77/93  soft-tissue]
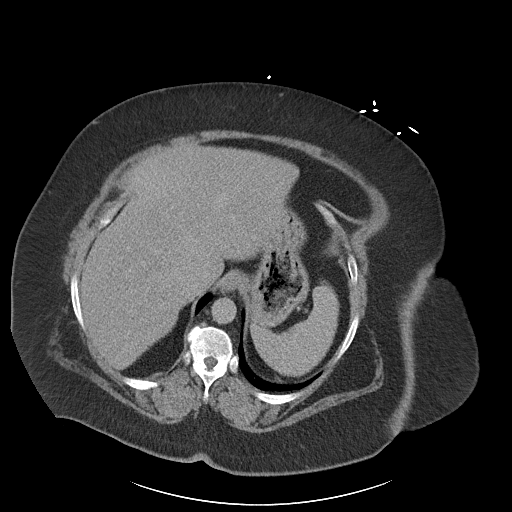
[im 85/93  soft-tissue]
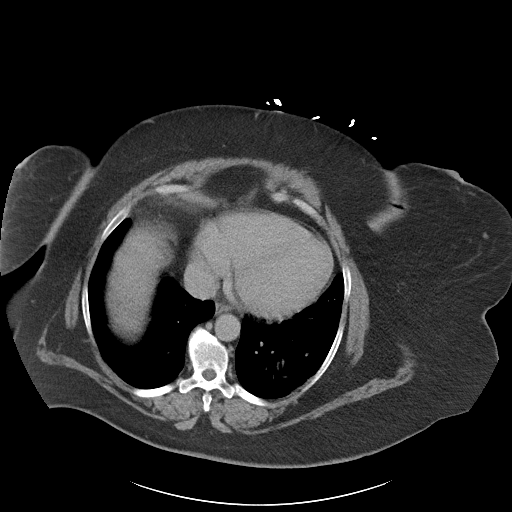

[Series 5: lung · axial · 0.96mm/px · z∈[-225,-179]mm · 3 of 99 slices shown]
[im 8/99  bone]
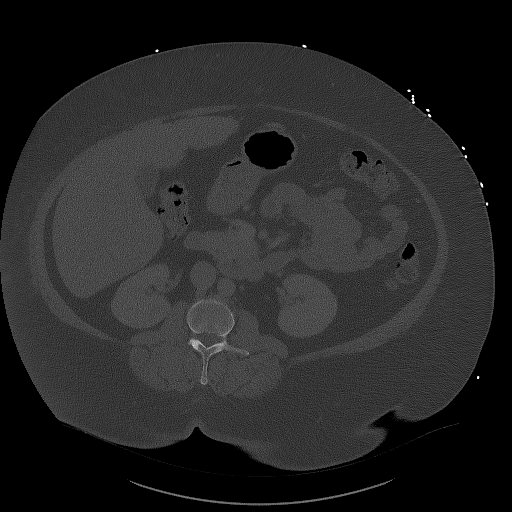
[im 23/99  bone]
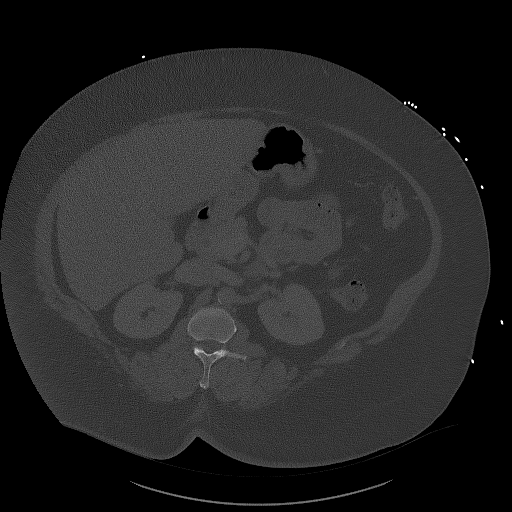
[im 31/99  bone]
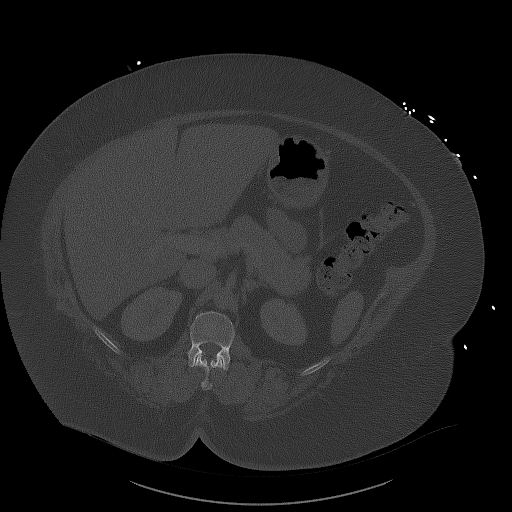

[Series 6: abdomen 3.0 mpr cor · coronal · 0.84mm/px · 3 of 127 slices shown]
[im 43/127  soft-tissue]
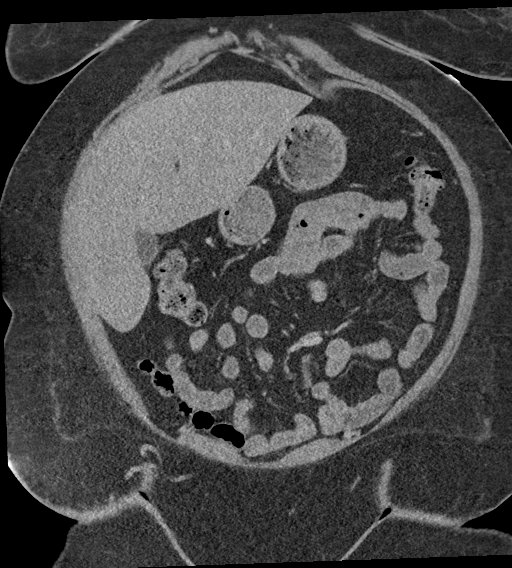
[im 57/127  soft-tissue]
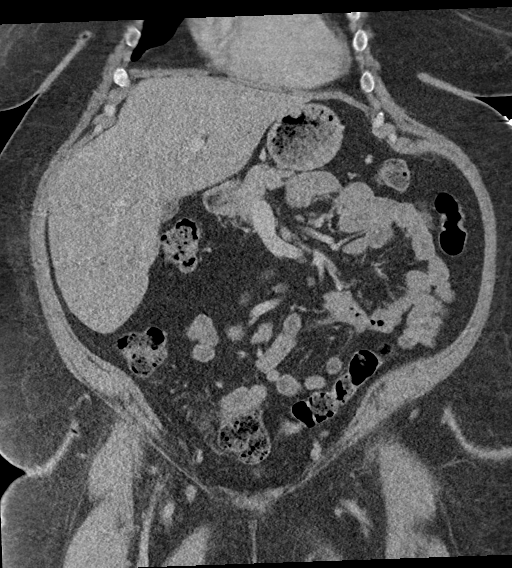
[im 71/127  soft-tissue]
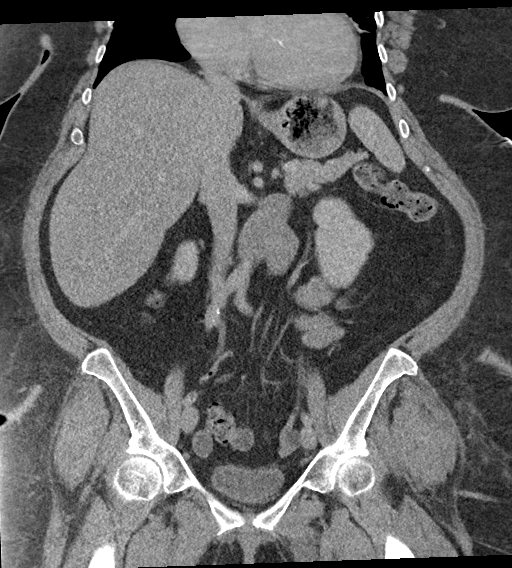

[17 of 46 positions shown; findings below may reference images not displayed]

FINDINGS: Lower chest: Hypoventilatory changes are noted. No acute pleural or
parenchymal lung disease.

Hepatobiliary: No focal liver abnormality is seen. No gallstones,
gallbladder wall thickening, or biliary dilatation.

Pancreas: Unremarkable. No pancreatic ductal dilatation or
surrounding inflammatory changes.

Spleen: Normal in size without focal abnormality.

Adrenals/Urinary Tract: Adrenal glands are unremarkable. Kidneys are
normal, without renal calculi, focal lesion, or hydronephrosis.
Bladder is unremarkable.

Stomach/Bowel: No bowel obstruction or ileus. Normal appendix right
lower quadrant. No wall thickening or inflammatory changes within
the bowel.

Vascular/Lymphatic: No significant vascular findings are present. No
enlarged abdominal or pelvic lymph nodes.

Reproductive: Status post hysterectomy. No adnexal masses.

Other: No abdominal wall hernia or abnormality. No abdominopelvic
ascites.

Musculoskeletal: No acute or destructive bony lesions. Reconstructed
images demonstrate no additional findings.
IMPRESSION: 1. Stable exam, no acute intra-abdominal or intrapelvic process.

## 2022-03-10 IMAGING — DX DG CHEST 1V PORT
1 series · 1 of 1 positions shown · non-contrast
Comparison: Chest radiograph dated 06/25/2019

CLINICAL DATA: Shortness of breath, congestive heart failure.

EXAM:
PORTABLE CHEST 1 VIEW

[chest ap]
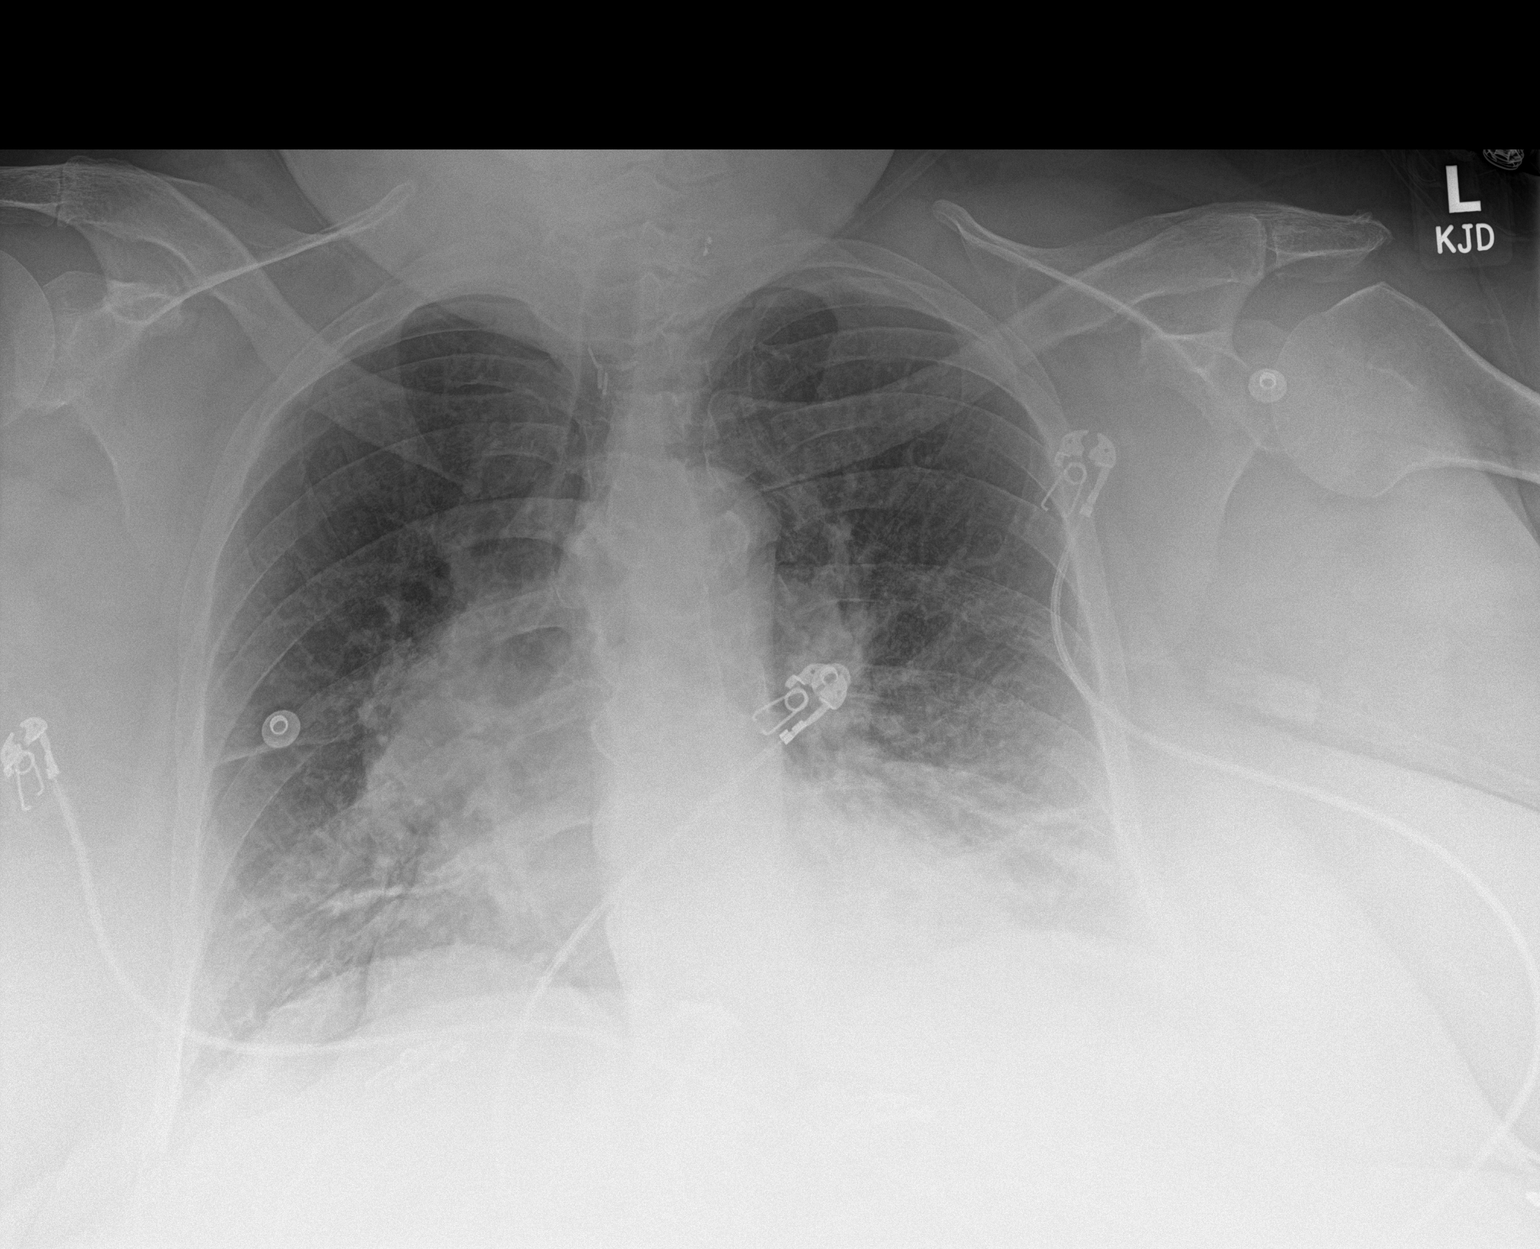

[1 of 1 positions shown; findings below may reference images not displayed]

FINDINGS: The heart remains enlarged. Mild to moderate bibasilar
atelectasis/airspace disease appears increased since 06/25/2019.
Small pleural effusions may contribute. There is no pneumothorax.
The osseous structures appear intact.
IMPRESSION: Cardiomegaly. Mild to moderate bibasilar atelectasis/airspace
disease. Small pleural effusions may contribute.

## 2022-03-10 NOTE — Patient Outreach (Signed)
Transition Care Management Follow-up Telephone Call Date of discharge and from where: 03/08/22 from [facility] How have you been since you were released from the hospital? Somewhat better Any questions or concerns? No  Items Reviewed: Did the pt receive and understand the discharge instructions provided? Yes  Medications obtained and verified? Yes  Other? No  Any new allergies since your discharge? No  Dietary orders reviewed? No Do you have support at home? No   Home Care and Equipment/Supplies: Were home health services ordered? no If so, what is the name of the agency? NA  Has the agency set up a time to come to the patient's home? not applicable Were any new equipment or medical supplies ordered?  No What is the name of the medical supply agency? NA Were you able to get the supplies/equipment? not applicable Do you have any questions related to the use of the equipment or supplies? NA  Functional Questionnaire: (I = Independent and D = Dependent) ADLs: I  Bathing/Dressing- I  Meal Prep- I  Eating- I  Maintaining continence- I  Transferring/Ambulation- I  Managing Meds- I  Follow up appointments reviewed:  PCP Hospital f/u appt confirmed? No, not an ED admision but patient was encouraged to consider PCP follow up if symptoms continue [facility] f/u appt confirmed? No Are transportation arrangements needed? No  If their condition worsens, is the pt aware to call PCP or go to the Emergency Dept.? Yes Was the patient provided with contact information for the PCP's office or ED? Yes Was to pt encouraged to call back with questions or concerns? Yes  SDOH assessments and interventions completed:   No

## 2022-03-13 DIAGNOSIS — I110 Hypertensive heart disease with heart failure: Secondary | ICD-10-CM | POA: Diagnosis not present

## 2022-03-14 DIAGNOSIS — I110 Hypertensive heart disease with heart failure: Secondary | ICD-10-CM | POA: Diagnosis not present

## 2022-03-14 LAB — CULTURE, BLOOD (ROUTINE X 2)
Culture: NO GROWTH
Special Requests: ADEQUATE

## 2022-03-15 DIAGNOSIS — I110 Hypertensive heart disease with heart failure: Secondary | ICD-10-CM | POA: Diagnosis not present

## 2022-03-16 DIAGNOSIS — I110 Hypertensive heart disease with heart failure: Secondary | ICD-10-CM | POA: Diagnosis not present

## 2022-03-17 DIAGNOSIS — I110 Hypertensive heart disease with heart failure: Secondary | ICD-10-CM | POA: Diagnosis not present

## 2022-03-20 DIAGNOSIS — I110 Hypertensive heart disease with heart failure: Secondary | ICD-10-CM | POA: Diagnosis not present

## 2022-03-21 DIAGNOSIS — I110 Hypertensive heart disease with heart failure: Secondary | ICD-10-CM | POA: Diagnosis not present

## 2022-03-22 DIAGNOSIS — I110 Hypertensive heart disease with heart failure: Secondary | ICD-10-CM | POA: Diagnosis not present

## 2022-03-23 DIAGNOSIS — I110 Hypertensive heart disease with heart failure: Secondary | ICD-10-CM | POA: Diagnosis not present

## 2022-03-24 DIAGNOSIS — I110 Hypertensive heart disease with heart failure: Secondary | ICD-10-CM | POA: Diagnosis not present

## 2022-03-27 DIAGNOSIS — I110 Hypertensive heart disease with heart failure: Secondary | ICD-10-CM | POA: Diagnosis not present

## 2022-03-28 DIAGNOSIS — N179 Acute kidney failure, unspecified: Secondary | ICD-10-CM | POA: Diagnosis not present

## 2022-03-28 DIAGNOSIS — N189 Chronic kidney disease, unspecified: Secondary | ICD-10-CM | POA: Diagnosis not present

## 2022-03-28 DIAGNOSIS — D631 Anemia in chronic kidney disease: Secondary | ICD-10-CM | POA: Diagnosis not present

## 2022-03-28 DIAGNOSIS — N1832 Chronic kidney disease, stage 3b: Secondary | ICD-10-CM | POA: Diagnosis not present

## 2022-03-28 DIAGNOSIS — N2581 Secondary hyperparathyroidism of renal origin: Secondary | ICD-10-CM | POA: Diagnosis not present

## 2022-03-28 DIAGNOSIS — I129 Hypertensive chronic kidney disease with stage 1 through stage 4 chronic kidney disease, or unspecified chronic kidney disease: Secondary | ICD-10-CM | POA: Diagnosis not present

## 2022-03-28 DIAGNOSIS — I5030 Unspecified diastolic (congestive) heart failure: Secondary | ICD-10-CM | POA: Diagnosis not present

## 2022-03-28 DIAGNOSIS — I110 Hypertensive heart disease with heart failure: Secondary | ICD-10-CM | POA: Diagnosis not present

## 2022-03-29 DIAGNOSIS — I110 Hypertensive heart disease with heart failure: Secondary | ICD-10-CM | POA: Diagnosis not present

## 2022-03-29 DIAGNOSIS — N2581 Secondary hyperparathyroidism of renal origin: Secondary | ICD-10-CM | POA: Diagnosis not present

## 2022-03-29 DIAGNOSIS — N179 Acute kidney failure, unspecified: Secondary | ICD-10-CM | POA: Diagnosis not present

## 2022-03-30 DIAGNOSIS — M25561 Pain in right knee: Secondary | ICD-10-CM | POA: Diagnosis not present

## 2022-03-30 DIAGNOSIS — M25562 Pain in left knee: Secondary | ICD-10-CM | POA: Diagnosis not present

## 2022-03-30 DIAGNOSIS — I110 Hypertensive heart disease with heart failure: Secondary | ICD-10-CM | POA: Diagnosis not present

## 2022-03-31 DIAGNOSIS — I110 Hypertensive heart disease with heart failure: Secondary | ICD-10-CM | POA: Diagnosis not present

## 2022-04-04 DIAGNOSIS — I110 Hypertensive heart disease with heart failure: Secondary | ICD-10-CM | POA: Diagnosis not present

## 2022-04-05 DIAGNOSIS — I110 Hypertensive heart disease with heart failure: Secondary | ICD-10-CM | POA: Diagnosis not present

## 2022-04-06 DIAGNOSIS — I110 Hypertensive heart disease with heart failure: Secondary | ICD-10-CM | POA: Diagnosis not present

## 2022-04-07 DIAGNOSIS — I110 Hypertensive heart disease with heart failure: Secondary | ICD-10-CM | POA: Diagnosis not present

## 2022-04-10 DIAGNOSIS — I110 Hypertensive heart disease with heart failure: Secondary | ICD-10-CM | POA: Diagnosis not present

## 2022-04-11 DIAGNOSIS — I110 Hypertensive heart disease with heart failure: Secondary | ICD-10-CM | POA: Diagnosis not present

## 2022-04-20 DIAGNOSIS — R0789 Other chest pain: Secondary | ICD-10-CM | POA: Diagnosis not present

## 2022-04-20 DIAGNOSIS — R079 Chest pain, unspecified: Secondary | ICD-10-CM | POA: Diagnosis not present

## 2022-04-20 DIAGNOSIS — F29 Unspecified psychosis not due to a substance or known physiological condition: Secondary | ICD-10-CM | POA: Diagnosis not present

## 2022-04-20 DIAGNOSIS — R4589 Other symptoms and signs involving emotional state: Secondary | ICD-10-CM | POA: Diagnosis not present

## 2022-04-27 ENCOUNTER — Other Ambulatory Visit: Payer: Medicaid Other | Admitting: Obstetrics and Gynecology

## 2022-04-27 NOTE — Patient Outreach (Signed)
RNCM returned patient's phone call, no answer-message left.  [name] RN, BSN [place]  [employer] - Managed Medicaid High Risk [phone]

## 2022-05-02 DIAGNOSIS — I110 Hypertensive heart disease with heart failure: Secondary | ICD-10-CM | POA: Diagnosis not present

## 2022-05-03 DIAGNOSIS — Z7689 Persons encountering health services in other specified circumstances: Secondary | ICD-10-CM | POA: Diagnosis not present

## 2022-05-08 DIAGNOSIS — I110 Hypertensive heart disease with heart failure: Secondary | ICD-10-CM | POA: Diagnosis not present

## 2022-05-09 DIAGNOSIS — I110 Hypertensive heart disease with heart failure: Secondary | ICD-10-CM | POA: Diagnosis not present

## 2022-05-10 DIAGNOSIS — I110 Hypertensive heart disease with heart failure: Secondary | ICD-10-CM | POA: Diagnosis not present

## 2022-05-11 DIAGNOSIS — I110 Hypertensive heart disease with heart failure: Secondary | ICD-10-CM | POA: Diagnosis not present

## 2022-05-11 DIAGNOSIS — Z419 Encounter for procedure for purposes other than remedying health state, unspecified: Secondary | ICD-10-CM | POA: Diagnosis not present

## 2022-05-12 DIAGNOSIS — I110 Hypertensive heart disease with heart failure: Secondary | ICD-10-CM | POA: Diagnosis not present

## 2022-05-14 ENCOUNTER — Other Ambulatory Visit: Payer: Self-pay

## 2022-05-14 ENCOUNTER — Emergency Department (HOSPITAL_COMMUNITY): Payer: Medicaid Other

## 2022-05-14 ENCOUNTER — Inpatient Hospital Stay (HOSPITAL_COMMUNITY)
Admission: EM | Admit: 2022-05-14 | Discharge: 2022-05-18 | DRG: 291 | Disposition: A | Discharge status: Home Health | Payer: Medicaid Other | Attending: Internal Medicine | Admitting: Internal Medicine

## 2022-05-14 DIAGNOSIS — I110 Hypertensive heart disease with heart failure: Principal | ICD-10-CM | POA: Diagnosis present

## 2022-05-14 DIAGNOSIS — Z886 Allergy status to analgesic agent status: Secondary | ICD-10-CM

## 2022-05-14 DIAGNOSIS — E119 Type 2 diabetes mellitus without complications: Secondary | ICD-10-CM

## 2022-05-14 DIAGNOSIS — E1169 Type 2 diabetes mellitus with other specified complication: Secondary | ICD-10-CM

## 2022-05-14 DIAGNOSIS — G894 Chronic pain syndrome: Secondary | ICD-10-CM | POA: Diagnosis present

## 2022-05-14 DIAGNOSIS — E890 Postprocedural hypothyroidism: Secondary | ICD-10-CM | POA: Diagnosis present

## 2022-05-14 DIAGNOSIS — M7989 Other specified soft tissue disorders: Secondary | ICD-10-CM | POA: Diagnosis not present

## 2022-05-14 DIAGNOSIS — G8929 Other chronic pain: Secondary | ICD-10-CM | POA: Diagnosis present

## 2022-05-14 DIAGNOSIS — Z90710 Acquired absence of both cervix and uterus: Secondary | ICD-10-CM

## 2022-05-14 DIAGNOSIS — E785 Hyperlipidemia, unspecified: Secondary | ICD-10-CM | POA: Diagnosis present

## 2022-05-14 DIAGNOSIS — J810 Acute pulmonary edema: Secondary | ICD-10-CM | POA: Diagnosis present

## 2022-05-14 DIAGNOSIS — Z888 Allergy status to other drugs, medicaments and biological substances status: Secondary | ICD-10-CM

## 2022-05-14 DIAGNOSIS — I5031 Acute diastolic (congestive) heart failure: Secondary | ICD-10-CM | POA: Diagnosis present

## 2022-05-14 DIAGNOSIS — R059 Cough, unspecified: Secondary | ICD-10-CM | POA: Diagnosis not present

## 2022-05-14 DIAGNOSIS — F32A Depression, unspecified: Secondary | ICD-10-CM | POA: Diagnosis present

## 2022-05-14 DIAGNOSIS — F419 Anxiety disorder, unspecified: Secondary | ICD-10-CM | POA: Diagnosis present

## 2022-05-14 DIAGNOSIS — Z7982 Long term (current) use of aspirin: Secondary | ICD-10-CM

## 2022-05-14 DIAGNOSIS — I5033 Acute on chronic diastolic (congestive) heart failure: Secondary | ICD-10-CM | POA: Diagnosis present

## 2022-05-14 DIAGNOSIS — M109 Gout, unspecified: Secondary | ICD-10-CM | POA: Diagnosis present

## 2022-05-14 DIAGNOSIS — Z1152 Encounter for screening for COVID-19: Secondary | ICD-10-CM

## 2022-05-14 DIAGNOSIS — I10 Essential (primary) hypertension: Secondary | ICD-10-CM | POA: Diagnosis present

## 2022-05-14 DIAGNOSIS — J811 Chronic pulmonary edema: Secondary | ICD-10-CM | POA: Diagnosis not present

## 2022-05-14 DIAGNOSIS — M1990 Unspecified osteoarthritis, unspecified site: Secondary | ICD-10-CM | POA: Diagnosis present

## 2022-05-14 DIAGNOSIS — J4489 Other specified chronic obstructive pulmonary disease: Secondary | ICD-10-CM | POA: Diagnosis present

## 2022-05-14 DIAGNOSIS — Z91030 Bee allergy status: Secondary | ICD-10-CM

## 2022-05-14 DIAGNOSIS — Z85850 Personal history of malignant neoplasm of thyroid: Secondary | ICD-10-CM

## 2022-05-14 DIAGNOSIS — K219 Gastro-esophageal reflux disease without esophagitis: Secondary | ICD-10-CM | POA: Diagnosis present

## 2022-05-14 DIAGNOSIS — Z86718 Personal history of other venous thrombosis and embolism: Secondary | ICD-10-CM

## 2022-05-14 DIAGNOSIS — F418 Other specified anxiety disorders: Secondary | ICD-10-CM | POA: Diagnosis present

## 2022-05-14 DIAGNOSIS — Z79890 Hormone replacement therapy (postmenopausal): Secondary | ICD-10-CM

## 2022-05-14 DIAGNOSIS — I509 Heart failure, unspecified: Secondary | ICD-10-CM

## 2022-05-14 DIAGNOSIS — R52 Pain, unspecified: Secondary | ICD-10-CM | POA: Diagnosis not present

## 2022-05-14 DIAGNOSIS — Z9981 Dependence on supplemental oxygen: Secondary | ICD-10-CM

## 2022-05-14 DIAGNOSIS — Z87891 Personal history of nicotine dependence: Secondary | ICD-10-CM

## 2022-05-14 DIAGNOSIS — Z833 Family history of diabetes mellitus: Secondary | ICD-10-CM

## 2022-05-14 DIAGNOSIS — Z79899 Other long term (current) drug therapy: Secondary | ICD-10-CM

## 2022-05-14 DIAGNOSIS — R531 Weakness: Secondary | ICD-10-CM | POA: Diagnosis not present

## 2022-05-14 DIAGNOSIS — Z743 Need for continuous supervision: Secondary | ICD-10-CM | POA: Diagnosis not present

## 2022-05-14 DIAGNOSIS — Z7984 Long term (current) use of oral hypoglycemic drugs: Secondary | ICD-10-CM

## 2022-05-14 DIAGNOSIS — D649 Anemia, unspecified: Secondary | ICD-10-CM | POA: Diagnosis present

## 2022-05-14 DIAGNOSIS — Z808 Family history of malignant neoplasm of other organs or systems: Secondary | ICD-10-CM

## 2022-05-14 NOTE — ED Triage Notes (Signed)
Pt BIB [facility] from home w c/o of foot pain from chronic gout and chronic R shoulder pain. Pt missed her doctor's appointment last week and stated to EMS that was she going to miss her upcoming appt as well. Pt is ambulatory.   EMS VS BP 102/50 P 80 O2 96% on 3L; pt wears 3L at home as well  CBG 90

## 2022-05-15 ENCOUNTER — Observation Stay (HOSPITAL_COMMUNITY): Payer: Medicaid Other

## 2022-05-15 ENCOUNTER — Encounter (HOSPITAL_COMMUNITY): Payer: Self-pay | Admitting: Internal Medicine

## 2022-05-15 DIAGNOSIS — J810 Acute pulmonary edema: Secondary | ICD-10-CM | POA: Diagnosis not present

## 2022-05-15 DIAGNOSIS — G894 Chronic pain syndrome: Secondary | ICD-10-CM | POA: Diagnosis not present

## 2022-05-15 DIAGNOSIS — D649 Anemia, unspecified: Secondary | ICD-10-CM

## 2022-05-15 DIAGNOSIS — I5033 Acute on chronic diastolic (congestive) heart failure: Secondary | ICD-10-CM | POA: Diagnosis not present

## 2022-05-15 DIAGNOSIS — I5031 Acute diastolic (congestive) heart failure: Secondary | ICD-10-CM | POA: Diagnosis not present

## 2022-05-15 DIAGNOSIS — I10 Essential (primary) hypertension: Secondary | ICD-10-CM | POA: Diagnosis not present

## 2022-05-15 DIAGNOSIS — E1169 Type 2 diabetes mellitus with other specified complication: Secondary | ICD-10-CM

## 2022-05-15 DIAGNOSIS — F419 Anxiety disorder, unspecified: Secondary | ICD-10-CM | POA: Diagnosis not present

## 2022-05-15 DIAGNOSIS — F32A Depression, unspecified: Secondary | ICD-10-CM | POA: Diagnosis not present

## 2022-05-15 DIAGNOSIS — E119 Type 2 diabetes mellitus without complications: Secondary | ICD-10-CM | POA: Diagnosis not present

## 2022-05-15 DIAGNOSIS — Z1152 Encounter for screening for COVID-19: Secondary | ICD-10-CM | POA: Diagnosis not present

## 2022-05-15 DIAGNOSIS — G8929 Other chronic pain: Secondary | ICD-10-CM | POA: Diagnosis not present

## 2022-05-15 DIAGNOSIS — E785 Hyperlipidemia, unspecified: Secondary | ICD-10-CM

## 2022-05-15 DIAGNOSIS — K219 Gastro-esophageal reflux disease without esophagitis: Secondary | ICD-10-CM | POA: Diagnosis not present

## 2022-05-15 DIAGNOSIS — Z9981 Dependence on supplemental oxygen: Secondary | ICD-10-CM | POA: Diagnosis not present

## 2022-05-15 DIAGNOSIS — I110 Hypertensive heart disease with heart failure: Secondary | ICD-10-CM | POA: Diagnosis not present

## 2022-05-15 DIAGNOSIS — E890 Postprocedural hypothyroidism: Secondary | ICD-10-CM | POA: Diagnosis not present

## 2022-05-15 LAB — BASIC METABOLIC PANEL
Anion gap: 8 (ref 5–15)
BUN: 11 mg/dL (ref 8–23)
CO2: 31 mmol/L (ref 22–32)
Calcium: 8.5 mg/dL — ABNORMAL LOW (ref 8.9–10.3)
Chloride: 97 mmol/L — ABNORMAL LOW (ref 98–111)
Creatinine, Ser: 0.81 mg/dL (ref 0.44–1.00)
GFR, Estimated: >60 mL/min (ref >60)
Glucose, Bld: 90 mg/dL (ref 70–99)
Potassium: 3.8 mmol/L (ref 3.5–5.1)
Sodium: 136 mmol/L (ref 135–145)

## 2022-05-15 LAB — GLUCOSE, CAPILLARY
Glucose-Capillary: 100 mg/dL — ABNORMAL HIGH (ref 70–99)
Glucose-Capillary: 131 mg/dL — ABNORMAL HIGH (ref 70–99)
Glucose-Capillary: 95 mg/dL (ref 70–99)
Glucose-Capillary: 149 mg/dL — ABNORMAL HIGH (ref 70–99)

## 2022-05-15 LAB — CBC WITH DIFFERENTIAL/PLATELET
Abs Immature Granulocytes: 0.08 10*3/uL — ABNORMAL HIGH (ref 0.00–0.07)
Basophils Absolute: 0.1 10*3/uL (ref 0.0–0.1)
Basophils Relative: 0 %
Eosinophils Absolute: 0.3 10*3/uL (ref 0.0–0.5)
Eosinophils Relative: 2 %
HCT: 32.7 % — ABNORMAL LOW (ref 36.0–46.0)
Hemoglobin: 10.2 g/dL — ABNORMAL LOW (ref 12.0–15.0)
Immature Granulocytes: 1 %
Lymphocytes Relative: 19 %
Lymphs Abs: 2.3 10*3/uL (ref 0.7–4.0)
MCH: 30.7 pg (ref 26.0–34.0)
MCHC: 31.2 g/dL (ref 30.0–36.0)
MCV: 98.5 fL (ref 80.0–100.0)
Monocytes Absolute: 0.8 10*3/uL (ref 0.1–1.0)
Monocytes Relative: 6 %
Neutro Abs: 8.9 10*3/uL — ABNORMAL HIGH (ref 1.7–7.7)
Neutrophils Relative %: 72 %
Platelets: 263 10*3/uL (ref 150–400)
RBC: 3.32 MIL/uL — ABNORMAL LOW (ref 3.87–5.11)
RDW: 13.3 % (ref 11.5–15.5)
WBC: 12.4 10*3/uL — ABNORMAL HIGH (ref 4.0–10.5)
nRBC: 0 % (ref 0.0–0.2)

## 2022-05-15 LAB — BRAIN NATRIURETIC PEPTIDE: B Natriuretic Peptide: 11.3 pg/mL (ref 0.0–100.0)

## 2022-05-15 LAB — ECHOCARDIOGRAM COMPLETE
Area-P 1/2: 3.07 cm2
Calc EF: 61.5 %
Height: 65 in
S' Lateral: 2.9 cm
Single Plane A2C EF: 61.9 %
Single Plane A4C EF: 60.2 %
Weight: 4484.8 oz

## 2022-05-15 LAB — RESP PANEL BY RT-PCR (RSV, FLU A&B, COVID)  RVPGX2
Influenza A by PCR: NEGATIVE
Influenza B by PCR: NEGATIVE
Resp Syncytial Virus by PCR: NEGATIVE
SARS Coronavirus 2 by RT PCR: NEGATIVE

## 2022-05-15 LAB — TROPONIN I (HIGH SENSITIVITY)
Troponin I (High Sensitivity): 4 ng/L (ref <18)
Troponin I (High Sensitivity): 7 ng/L (ref <18)

## 2022-05-15 MED ORDER — DOCUSATE SODIUM 100 MG PO CAPS
100.0000 mg | ORAL_CAPSULE | Freq: Two times a day (BID) | ORAL | Status: DC
  Administered 2022-05-15 – 2022-05-18 (×7): 100 mg via ORAL
  Filled 2022-05-15 (×7): qty 1

## 2022-05-15 MED ORDER — ROSUVASTATIN CALCIUM 5 MG PO TABS
10.0000 mg | ORAL_TABLET | Freq: Every day | ORAL | Status: DC
  Administered 2022-05-15 – 2022-05-18 (×4): 10 mg via ORAL
  Filled 2022-05-15 (×5): qty 2

## 2022-05-15 MED ORDER — SODIUM CHLORIDE 0.9% FLUSH
3.0000 mL | Freq: Two times a day (BID) | INTRAVENOUS | Status: DC
  Administered 2022-05-15 – 2022-05-18 (×7): 3 mL via INTRAVENOUS

## 2022-05-15 MED ORDER — ENOXAPARIN SODIUM 40 MG/0.4ML IJ SOSY
40.0000 mg | PREFILLED_SYRINGE | INTRAMUSCULAR | Status: DC
  Administered 2022-05-15 – 2022-05-17 (×3): 40 mg via SUBCUTANEOUS
  Filled 2022-05-15 (×4): qty 0.4

## 2022-05-15 MED ORDER — ACETAMINOPHEN 325 MG PO TABS
650.0000 mg | ORAL_TABLET | ORAL | Status: DC | PRN
  Administered 2022-05-18: 650 mg via ORAL
  Filled 2022-05-15: qty 2

## 2022-05-15 MED ORDER — POLYETHYLENE GLYCOL 3350 17 G PO PACK
17.0000 g | PACK | Freq: Once | ORAL | Status: AC
  Administered 2022-05-15: 17 g via ORAL
  Filled 2022-05-15: qty 1

## 2022-05-15 MED ORDER — CLONAZEPAM 0.5 MG PO TABS
2.0000 mg | ORAL_TABLET | Freq: Three times a day (TID) | ORAL | Status: DC
  Administered 2022-05-15 – 2022-05-17 (×9): 2 mg via ORAL
  Administered 2022-05-18: 1.5 mg via ORAL
  Filled 2022-05-15 (×11): qty 4

## 2022-05-15 MED ORDER — SODIUM CHLORIDE 0.9 % IV SOLN
500.0000 mg | Freq: Once | INTRAVENOUS | Status: AC
  Administered 2022-05-15: 500 mg via INTRAVENOUS
  Filled 2022-05-15: qty 5

## 2022-05-15 MED ORDER — PERFLUTREN LIPID MICROSPHERE
1.0000 mL | INTRAVENOUS | Status: AC | PRN
  Administered 2022-05-15: 2 mL via INTRAVENOUS

## 2022-05-15 MED ORDER — LISINOPRIL 5 MG PO TABS
5.0000 mg | ORAL_TABLET | Freq: Every day | ORAL | Status: DC
  Administered 2022-05-15 – 2022-05-18 (×4): 5 mg via ORAL
  Filled 2022-05-15 (×4): qty 1

## 2022-05-15 MED ORDER — ACETAMINOPHEN 500 MG PO TABS
1000.0000 mg | ORAL_TABLET | Freq: Once | ORAL | Status: AC
  Administered 2022-05-15: 1000 mg via ORAL
  Filled 2022-05-15: qty 2

## 2022-05-15 MED ORDER — INSULIN ASPART 100 UNIT/ML IJ SOLN: 0.0000 [IU] | Freq: Every day | INTRAMUSCULAR | Status: DC

## 2022-05-15 MED ORDER — OXYCODONE-ACETAMINOPHEN 5-325 MG PO TABS
1.0000 | ORAL_TABLET | Freq: Four times a day (QID) | ORAL | Status: DC | PRN
  Administered 2022-05-15 – 2022-05-17 (×5): 1 via ORAL
  Filled 2022-05-15 (×6): qty 1

## 2022-05-15 MED ORDER — SODIUM CHLORIDE 0.9 % IV SOLN
1.0000 g | Freq: Once | INTRAVENOUS | Status: AC
  Administered 2022-05-15: 1 g via INTRAVENOUS
  Filled 2022-05-15: qty 10

## 2022-05-15 MED ORDER — OXYCODONE-ACETAMINOPHEN 10-325 MG PO TABS: 1.0000 | ORAL_TABLET | Freq: Four times a day (QID) | ORAL | Status: DC | PRN

## 2022-05-15 MED ORDER — FUROSEMIDE 10 MG/ML IJ SOLN
40.0000 mg | Freq: Every day | INTRAMUSCULAR | Status: DC
  Administered 2022-05-15 – 2022-05-18 (×4): 40 mg via INTRAVENOUS
  Filled 2022-05-15 (×4): qty 4

## 2022-05-15 MED ORDER — GUAIFENESIN-DM 100-10 MG/5ML PO SYRP
15.0000 mL | ORAL_SOLUTION | ORAL | Status: DC | PRN
  Administered 2022-05-16: 15 mL via ORAL
  Filled 2022-05-15 (×2): qty 15

## 2022-05-15 MED ORDER — FUROSEMIDE 10 MG/ML IJ SOLN
40.0000 mg | Freq: Once | INTRAMUSCULAR | Status: AC
  Administered 2022-05-15: 40 mg via INTRAVENOUS
  Filled 2022-05-15: qty 4

## 2022-05-15 MED ORDER — SODIUM CHLORIDE 0.9% FLUSH: 3.0000 mL | INTRAVENOUS | Status: DC | PRN

## 2022-05-15 MED ORDER — OXYCODONE HCL 5 MG PO TABS
5.0000 mg | ORAL_TABLET | Freq: Four times a day (QID) | ORAL | Status: DC | PRN
  Administered 2022-05-15 – 2022-05-17 (×3): 5 mg via ORAL
  Filled 2022-05-15 (×4): qty 1

## 2022-05-15 MED ORDER — CARVEDILOL 3.125 MG PO TABS
3.1250 mg | ORAL_TABLET | Freq: Two times a day (BID) | ORAL | Status: DC
  Administered 2022-05-15 – 2022-05-18 (×6): 3.125 mg via ORAL
  Filled 2022-05-15 (×6): qty 1

## 2022-05-15 MED ORDER — SODIUM CHLORIDE 0.9 % IV SOLN: 250.0000 mL | INTRAVENOUS | Status: DC | PRN

## 2022-05-15 MED ORDER — INSULIN ASPART 100 UNIT/ML IJ SOLN
0.0000 [IU] | Freq: Three times a day (TID) | INTRAMUSCULAR | Status: DC
  Administered 2022-05-15: 2 [IU] via SUBCUTANEOUS
  Administered 2022-05-17: 3 [IU] via SUBCUTANEOUS
  Administered 2022-05-17: 2 [IU] via SUBCUTANEOUS
  Administered 2022-05-17: 3 [IU] via SUBCUTANEOUS

## 2022-05-15 MED ORDER — ONDANSETRON HCL 4 MG/2ML IJ SOLN: 4.0000 mg | Freq: Four times a day (QID) | INTRAMUSCULAR | Status: DC | PRN

## 2022-05-15 NOTE — Progress Notes (Signed)
Heart Failure Navigator Progress Note  Assessed for Heart & Vascular TOC clinic readiness.  Patient with a EF 55-60% HFpEF, admitted with complaints of foot and shoulder pain, gout. .   Navigator available for reassessment of patient.   Michele Owens, BSN, Clinical cytogeneticist Only

## 2022-05-15 NOTE — Assessment & Plan Note (Signed)
HGB 10.2 today, chronic, stable, baseline Cont PO iron if pt still taking this (med rec pending)

## 2022-05-15 NOTE — ED Notes (Signed)
RN introduced self to pt and explained plan to covid swab, place IV, and obtain lab work. Pt questioned covid swap and went on extended tangent, but agreed to swab after further discussion. RN attempted IV x1 unsuccessfully. During attempt pt was pulling away from RN, verbally insulting RN on competency to perform task making placement more difficult than necessary. RN explained process to pt and pt stated "I know how to do Ivs, you don't know what you are doing and I don't trust you." RN did not try for a second attempt with pt current frustration. RN attempted to move forward with covid swabbing patient but patient began to yell that "I dont trust you with my nose." RN exited room and requested assistance from other RN.

## 2022-05-15 NOTE — Assessment & Plan Note (Signed)
Continue percocet 10 Q6H PRN.  Confirmed that pt has current script via [facility].  Filled 04/26/22.

## 2022-05-15 NOTE — H&P (Signed)
History and Physical    PatientDonnella Owens PNT:[id number] DOB: 1960/09/10 DOA: 05/14/2022 DOS: the patient was seen and examined on 05/15/2022 PCP: [name], MD  Patient coming from: Home  Chief Complaint:  Chief Complaint  Patient presents with   Cough   Weakness   Leg Swelling   HPI: Michele Owens is a 62 y.o. female with medical history significant of DM2, dCHF, COPD, HTN.  Pt in to ED with c/o cough, worsening leg swelling.  Cough non-productive.  Associated SOB.  CP and R shoulder pain though this pain sounds to be more chronic possibly.  No fevers.  Pt reports that fluid gain has been slowly progressive for a couple of months now ever since they cut her lasix from BID to once a day.  She also complains of pain all over.    Review of Systems: As mentioned in the history of present illness. All other systems reviewed and are negative. Past Medical History:  Diagnosis Date   Anxiety    Arthritis    Asthma    Chronic diastolic CHF (congestive heart failure) (HCC)    COPD (chronic obstructive pulmonary disease) (HCC)    Uses Oxygen at night   Depression    Diabetes mellitus without complication (HCC)    GERD (gastroesophageal reflux disease)    Gout    Headache    migraines   History of DVT of lower extremity    Hypertension    Thyroid cancer ([place]) 09/23/2016   Past Surgical History:  Procedure Laterality Date   BUNIONECTOMY Bilateral    CARDIAC CATHETERIZATION N/A 06/23/2015   Procedure: Right/Left Heart Cath and Coronary Angiography;  Surgeon: [name], MD;  Location: [facility];  Service: Cardiovascular;  Laterality: N/A;   COLONOSCOPY WITH PROPOFOL N/A 12/15/2015   Procedure: COLONOSCOPY WITH PROPOFOL;  Surgeon: [name], MD;  Location: [facility];  Service: Endoscopy;  Laterality: N/A;   RIGHT HEART CATH N/A 10/01/2019   Procedure: RIGHT HEART CATH;  Surgeon: [name], MD;  Location: [facility];  Service:  Cardiovascular;  Laterality: N/A;   THYROIDECTOMY  09/19/2016   THYROIDECTOMY N/A 09/19/2016   Procedure: TOTAL THYROIDECTOMY;  Surgeon: [name], MD;  Location: [facility];  Service: General;  Laterality: N/A;   TONSILLECTOMY     TOTAL ABDOMINAL HYSTERECTOMY  07/14/10   Social History:  reports that she quit smoking about 5 years ago. Her smoking use included cigarettes. She has a 20.50 pack-year smoking history. She has never used smokeless tobacco. She reports that she does not currently use alcohol. She reports that she does not currently use drugs after having used the following drugs: Marijuana and Cocaine.  Allergies  Allergen Reactions   Bee Venom Swelling and Other (See Comments)    "All over my body" (swelling)   Fioricet [Butalbital-Apap-Caffeine] Nausea And Vomiting and Rash   Ibuprofen Rash and Other (See Comments)    Severe rash   Lamisil [Terbinafine] Rash and Other (See Comments)    Pt states this causes her to "feel funny"   Nsaids Other (See Comments)    Per MD's orders     Family History  Problem Relation Age of Onset   Cancer Father        thought to be due to exposure to concrete   Diabetes Mother    Thyroid cancer Mother        dx in her 27s-60s   Cancer Maternal Uncle  2 uncles with cancer NOS   Brain cancer Paternal Aunt    Cancer Cousin        maternal first cousin - NOS   Cancer Cousin        maternal first cousin - NOS    Prior to Admission medications   Medication Sig Start Date End Date Taking? Authorizing Provider  albuterol (PROVENTIL HFA;VENTOLIN HFA) 108 (90 Base) MCG/ACT inhaler Inhale 1-2 puffs into the lungs every 6 (six) hours as needed for wheezing or shortness of breath. 04/23/18  Yes Long, [name], MD  allopurinol (ZYLOPRIM) 100 MG tablet Take 100 mg by mouth daily.   Yes [provider]  aspirin EC 81 MG tablet Take 81 mg by mouth daily. Swallow whole.   Yes [provider]  busPIRone (BUSPAR) 5 MG tablet Take 5  mg by mouth 3 (three) times daily.   Yes [provider]  acetaminophen (TYLENOL) 500 MG tablet Take 1,000 mg by mouth every 6 (six) hours as needed for moderate pain or headache.    [provider]  blood glucose meter kit and supplies KIT Dispense based on patient and insurance preference. Use up to four times daily as directed. 12/25/20   Little Ishikawa, MD  Butalbital-APAP-Caffeine 50-300-40 MG CAPS Take 1 capsule by mouth 2 (two) times daily as needed for headache.    [provider]  calcitRIOL (ROCALTROL) 0.5 MCG capsule Take 1 capsule (0.5 mcg total) by mouth daily. 09/26/16   Hongalgi, [name], MD  carvedilol (COREG) 6.25 MG tablet Take 1 tablet (6.25 mg total) by mouth 2 (two) times daily with a meal. 01/23/18   Bensimhon, [name], MD  clonazePAM (KLONOPIN) 2 MG tablet Take 1 tablet (2 mg total) by mouth 3 (three) times daily. for anxiety Patient taking differently: Take 2 mg by mouth 3 (three) times daily. 06/23/19   [name], MD  cyclobenzaprine (FLEXERIL) 10 MG tablet Take 10 mg by mouth 3 (three) times daily as needed for muscle spasms. 11/19/19   [provider]  dicyclomine (BENTYL) 20 MG tablet Take 1 tablet (20 mg total) by mouth 2 (two) times daily. 03/09/22   Maudie Flakes, MD  EPINEPHrine 0.3 mg/0.3 mL IJ SOAJ injection Inject 0.3 mg into the muscle once as needed for anaphylaxis.    [provider]  fenofibrate (TRICOR) 145 MG tablet Take 145 mg by mouth daily. 06/07/21   [provider]  FEROSUL 325 (65 Fe) MG tablet Take 325 mg by mouth daily. 03/22/17   [provider]  glucose blood test strip Use as instructed to check blood sugar up to 4 times daily Patient taking differently: 1 each by Other route See admin instructions. Use as instructed to check blood sugar up to 4 times daily 08/06/17   [name], DO  ipratropium-albuterol (DUONEB) 0.5-2.5 (3) MG/3ML SOLN Take 3 mLs by nebulization 3 (three)  times daily. 04/05/17   [provider]  levocetirizine (XYZAL) 5 MG tablet Take 5 mg by mouth at bedtime. Patient not taking: Reported on 02/02/2022    [provider]  levothyroxine (SYNTHROID, LEVOTHROID) 112 MCG tablet Take 224 mcg by mouth daily before breakfast. 07/11/17   [provider]  lisinopril (ZESTRIL) 5 MG tablet Take 1 tablet (5 mg total) by mouth daily. Please hold this medication for three days, please check your blood pressure at home, bring in record for your pcp to review 02/03/22   Florencia Reasons, MD  loperamide (  IMODIUM) 2 MG capsule Take 1 capsule (2 mg total) by mouth 4 (four) times daily as needed for diarrhea or loose stools. 03/09/22   Maudie Flakes, MD  loratadine (CLARITIN) 10 MG tablet Take 10 mg by mouth daily as needed for allergies.    [provider]  metFORMIN (GLUCOPHAGE) 500 MG tablet Take 1 tablet (500 mg total) by mouth 2 (two) times daily with a meal. 12/11/14   [name], MD  nitroGLYCERIN (NITROSTAT) 0.4 MG SL tablet Place 1 tablet (0.4 mg total) under the tongue every 5 (five) minutes as needed for chest pain. 06/19/18   [name], MD  omeprazole (PRILOSEC) 20 MG capsule Take 20 mg by mouth 2 (two) times daily as needed (acid reflux). 05/19/21   [provider]  ondansetron (ZOFRAN) 4 MG tablet Take 4 mg by mouth every 8 (eight) hours as needed for nausea or vomiting. 01/16/22   [provider]  oxyCODONE-acetaminophen (PERCOCET) 10-325 MG tablet Take 1 tablet by mouth every 6 (six) hours as needed for pain. 12/03/19   [provider]  OXYGEN Inhale 3 L into the lungs continuous.    [provider]  phentermine (ADIPEX-P) 37.5 MG tablet Take 37.5 mg by mouth daily. 05/25/21   [provider]  polyethylene glycol (MIRALAX / GLYCOLAX) 17 g packet Take 17 g by mouth daily as needed for mild constipation. 02/03/22   Florencia Reasons, MD  potassium chloride SA (K-DUR,KLOR-CON) 20 MEQ  tablet Take 1 tablet (20 mEq total) by mouth 2 (two) times daily. 07/24/18   [name], MD  rosuvastatin (CRESTOR) 10 MG tablet Take 10 mg by mouth every evening. 11/19/19   [provider]  senna-docusate (SENOKOT-S) 8.6-50 MG tablet Take 1 tablet by mouth daily.    [provider]  torsemide (DEMADEX) 20 MG tablet TAKE 2 TABLETS (40 MG TOTAL) BY MOUTH 2 (TWO) TIMES DAILY. Patient taking differently: Take 60 mg by mouth 2 (two) times daily. 07/05/21   [name], MD  traZODone (DESYREL) 50 MG tablet Take 100 mg by mouth at bedtime. 11/19/19   [provider]  triamcinolone cream (KENALOG) 0.1 % Apply 1 application. topically 2 (two) times daily as needed (irrritation). 09/10/20   [provider]  umeclidinium-vilanterol (ANORO ELLIPTA) 62.5-25 MCG/INH AEPB Inhale 1 puff into the lungs daily.    [provider]  valACYclovir (VALTREX) 1000 MG tablet Take 1,000 mg by mouth daily. 01/19/22   [provider]    Physical Exam: Vitals:   05/14/22 2313 05/14/22 2315 05/15/22 0000  BP:  (!) 124/92 112/62  Pulse:  81 79  Resp:  18 17  Temp:  97.8 F (36.6 C)   TempSrc:  Oral   SpO2:  99% 100%  Weight: 104.3 kg    Height: '5\' 5"'$  (1.651 m)     Constitutional: NAD, calm, comfortable Respiratory: clear to auscultation bilaterally, no wheezing, no crackles. Normal respiratory effort. No accessory muscle use.  Cardiovascular: Regular rate and rhythm, no murmurs / rubs / gallops. Anasarca present. 2+ pedal pulses. No carotid bruits.  Neurologic: CN 2-12 grossly intact. Sensation intact, DTR normal. Strength 5/5 in all 4.  Psychiatric: Normal judgment and insight. Alert and oriented x 3. Normal mood.   Data Reviewed:     CXR = IMPRESSION: 1. Cardiomegaly with pulmonary vascular congestion. 2. Patchy airspace disease at the lung bases, possible edema or infiltrate.  COVID, flu, RSV = negative  BNP 21  Latest Ref Rng & Units  05/15/2022   12:40 AM 03/08/2022   11:39 PM 02/03/2022    5:50 AM  CBC  WBC 4.0 - 10.5 K/uL 12.4  13.4  10.2   Hemoglobin 12.0 - 15.0 g/dL 10.2  9.7  9.6   Hematocrit 36.0 - 46.0 % 32.7  31.6  30.1   Platelets 150 - 400 K/uL 263  354  279       Latest Ref Rng & Units 05/15/2022   12:40 AM 03/08/2022   11:39 PM 02/03/2022    5:50 AM  CMP  Glucose 70 - 99 mg/dL 90  100  90   BUN 8 - 23 mg/dL 11  33  11   Creatinine 0.44 - 1.00 mg/dL 0.81  1.55  0.87   Sodium 135 - 145 mmol/L 136  139  141   Potassium 3.5 - 5.1 mmol/L 3.8  4.3  4.3   Chloride 98 - 111 mmol/L 97  95  103   CO2 22 - 32 mmol/L 31  33  30   Calcium 8.9 - 10.3 mg/dL 8.5  9.4  9.0   Total Protein 6.5 - 8.1 g/dL  7.2    Total Bilirubin 0.3 - 1.2 mg/dL  0.4    Alkaline Phos 38 - 126 U/L  43    AST 15 - 41 U/L  21    ALT 0 - 44 U/L  16      Trop 4   Assessment and Plan: * Acute pulmonary edema (HCC) Pt with anasarca, pulmonary edema.  Looks like R > L CHF. H/o HFpEF and PAH it looks like from chart review. BNP normal, but looks like hers always is even with fluid overload in past. CHF pathway Lasix '40mg'$  IV x1 in ED and '40mg'$  IV daily here. Strict intake and output Daily BMP 2d echo Tele monitor  Benign essential HTN Continue Coreg, lisinopril  Controlled type 2 diabetes mellitus without complication, without long-term current use of insulin (HCC) Hold metformin Mod scale SSI AC/HS  Chronic pain disorder Continue percocet 10 Q6H PRN.  Confirmed that pt has current script via Start.  Filled 04/26/22.  Normocytic anemia HGB 10.2 today, chronic, stable, baseline Cont PO iron if pt still taking this (med rec pending)  Anxiety and depression Cont TID Klonapin.  Current script confirmed via PMP aware filled 04/26/22.  Dyslipidemia associated with type 2 diabetes mellitus (HCC) Continue fibrate and statin once med rec is completed      Advance Care Planning:   Code Status: Full Code  Consults:  None  Family Communication: No family in room  Severity of Illness: The appropriate patient status for this patient is OBSERVATION. Observation status is judged to be reasonable and necessary in order to provide the required intensity of service to ensure the patient's safety. The patient's presenting symptoms, physical exam findings, and initial radiographic and laboratory data in the context of their medical condition is felt to place them at decreased risk for further clinical deterioration. Furthermore, it is anticipated that the patient will be medically stable for discharge from the hospital within 2 midnights of admission.   Author: Etta Quill., DO 05/15/2022 3:20 AM  For on call review [email or web address].

## 2022-05-15 NOTE — Assessment & Plan Note (Signed)
Continue Coreg, lisinopril

## 2022-05-15 NOTE — Progress Notes (Signed)
  Progress Note   Patient: Michele Owens OEU:[id number] DOB: 06-Oct-1960 DOA: 05/14/2022     0 DOS: the patient was seen and examined on 05/15/2022   Brief hospital course:  Assessment and Plan: * Acute pulmonary edema (HCC) - IV lasix 40 mg daily  - Cardiac monitoring  - Daily weight - Strict I and O  - ECHO report pending   Benign essential HTN - Lisinopril 5 mg PO daily  - Coreg 3.125 mg PO bid   Controlled type 2 diabetes mellitus without complication, without long-term current use of insulin (HCC) - Novolog SS ACHS   Chronic pain disorder - Percocet 1 tablet PO q6 hr PRN   Normocytic anemia - Monitor CBC daily   Anxiety and depression - Clonazepam 2 mg PO tid   Dyslipidemia associated with type 2 diabetes mellitus (HCC) - Crestor 10 mg PO daily   DVT prophylaxis: Lovenox 40 mg sq daily       Subjective: Pt seen and examined at the bedside. She is feeling slightly better today. However, she is still on 3L Frannie oxygen support. She continues on IV lasix today as she still has 1+ pitting edema. ECHO report is pending.   Physical Exam: Vitals:   05/15/22 0411 05/15/22 0440 05/15/22 0523 05/15/22 0601  BP: (!) 124/49     Pulse: 81     Resp: 17   16  Temp:  98.3 F (36.8 C)  97.9 F (36.6 C)  TempSrc:  Oral  Oral  SpO2: 99%     Weight:   127.1 kg   Height:   '5\' 5"'$  (1.651 m)    Physical Exam Constitutional:      Appearance: Normal appearance.  HENT:     Head: Normocephalic and atraumatic.     Mouth/Throat:     Mouth: Mucous membranes are moist.  Cardiovascular:     Rate and Rhythm: Normal rate and regular rhythm.  Pulmonary:     Effort: Pulmonary effort is normal.     Comments: Decreased breath sounds at the bases  Musculoskeletal:     Cervical back: Neck supple.  Skin:    General: Skin is warm.     Comments: 1+ pitting edema b/l lower legs   Neurological:     Mental Status: She is alert.    Data Reviewed:   Disposition: Status is: Observation   Planned Discharge Destination: Home    Time spent: 35 minutes  Author: Lucienne Minks , MD 05/15/2022 8:44 AM  For on call review [email or web address].

## 2022-05-15 NOTE — Plan of Care (Signed)
  Problem: Education: ?Goal: Knowledge of General Education information will improve ?Description: Including pain rating scale, medication(s)/side effects and non-pharmacologic comfort measures ?Outcome: Progressing ?  ?Problem: Health Behavior/Discharge Planning: ?Goal: Ability to manage health-related needs will improve ?Outcome: Progressing ?  ?Problem: Clinical Measurements: ?Goal: Respiratory complications will improve ?Outcome: Progressing ?  ?Problem: Activity: ?Goal: Risk for activity intolerance will decrease ?Outcome: Progressing ?  ?Problem: Pain Managment: ?Goal: General experience of comfort will improve ?Outcome: Progressing ?  ?Problem: Safety: ?Goal: Ability to remain free from injury will improve ?Outcome: Progressing ?  ?Problem: Skin Integrity: ?Goal: Risk for impaired skin integrity will decrease ?Outcome: Progressing ?  ?

## 2022-05-15 NOTE — Assessment & Plan Note (Signed)
Hold metformin Mod scale SSI AC/HS 

## 2022-05-15 NOTE — Progress Notes (Signed)
Echocardiogram 2D Echocardiogram has been performed.  Michele Owens 05/15/2022, 11:21 AM

## 2022-05-15 NOTE — Assessment & Plan Note (Signed)
Continue fibrate and statin once med rec is completed

## 2022-05-15 NOTE — ED Provider Notes (Signed)
[facility] Provider Note   CSN: [id number] Arrival date & time: 05/14/22  2307     History  Chief Complaint  Patient presents with   Cough   Weakness   Leg Swelling    Michele Owens is a 62 y.o. female.  The history is provided by the patient.  Cough Cough characteristics:  Non-productive Severity:  Moderate Onset quality:  Gradual Timing:  Intermittent Progression:  Unchanged Chronicity:  New Context: not fumes   Relieved by:  Nothing Worsened by:  Nothing Ineffective treatments:  None tried Associated symptoms: chest pain and shortness of breath   Associated symptoms: no fever   Risk factors: no chemical exposure   Patient with CHF, GERD presents with several complaints from shortness of breath with cough and upper chest and right shoulder. Patient also reports cough, fluid overload and feet swelling and pain.  No fevers.       Past Medical History:  Diagnosis Date   Anxiety    Arthritis    Asthma    Chronic diastolic CHF (congestive heart failure) (HCC)    COPD (chronic obstructive pulmonary disease) (HCC)    Uses Oxygen at night   Depression    Diabetes mellitus without complication (HCC)    GERD (gastroesophageal reflux disease)    Gout    Headache    migraines   History of DVT of lower extremity    Hypertension    Thyroid cancer ([place]) 09/23/2016    Home Medications Prior to Admission medications   Medication Sig Start Date End Date Taking? Authorizing Provider  albuterol (PROVENTIL HFA;VENTOLIN HFA) 108 (90 Base) MCG/ACT inhaler Inhale 1-2 puffs into the lungs every 6 (six) hours as needed for wheezing or shortness of breath. 04/23/18  Yes Long, [name], MD  allopurinol (ZYLOPRIM) 100 MG tablet Take 100 mg by mouth daily.   Yes [provider]  aspirin EC 81 MG tablet Take 81 mg by mouth daily. Swallow whole.   Yes [provider]  busPIRone (BUSPAR) 5 MG tablet Take 5 mg by mouth 3  (three) times daily.   Yes [provider]  acetaminophen (TYLENOL) 500 MG tablet Take 1,000 mg by mouth every 6 (six) hours as needed for moderate pain or headache.    [provider]  blood glucose meter kit and supplies KIT Dispense based on patient and insurance preference. Use up to four times daily as directed. 12/25/20   Little Ishikawa, MD  Butalbital-APAP-Caffeine 50-300-40 MG CAPS Take 1 capsule by mouth 2 (two) times daily as needed for headache.    [provider]  calcitRIOL (ROCALTROL) 0.5 MCG capsule Take 1 capsule (0.5 mcg total) by mouth daily. 09/26/16   Hongalgi, [name], MD  carvedilol (COREG) 6.25 MG tablet Take 1 tablet (6.25 mg total) by mouth 2 (two) times daily with a meal. 01/23/18   Bensimhon, [name], MD  clonazePAM (KLONOPIN) 2 MG tablet Take 1 tablet (2 mg total) by mouth 3 (three) times daily. for anxiety Patient taking differently: Take 2 mg by mouth 3 (three) times daily. 06/23/19   [name], MD  cyclobenzaprine (FLEXERIL) 10 MG tablet Take 10 mg by mouth 3 (three) times daily as needed for muscle spasms. 11/19/19   [provider]  dicyclomine (BENTYL) 20 MG tablet Take 1 tablet (20 mg total) by mouth 2 (two) times daily. 03/09/22   Maudie Flakes, MD  EPINEPHrine 0.3 mg/0.3 mL IJ SOAJ injection  Inject 0.3 mg into the muscle once as needed for anaphylaxis.    [provider]  fenofibrate (TRICOR) 145 MG tablet Take 145 mg by mouth daily. 06/07/21   [provider]  FEROSUL 325 (65 Fe) MG tablet Take 325 mg by mouth daily. 03/22/17   [provider]  glucose blood test strip Use as instructed to check blood sugar up to 4 times daily Patient taking differently: 1 each by Other route See admin instructions. Use as instructed to check blood sugar up to 4 times daily 08/06/17   [name], DO  ipratropium-albuterol (DUONEB) 0.5-2.5 (3) MG/3ML SOLN Take 3 mLs by nebulization 3 (three) times daily.  04/05/17   [provider]  levocetirizine (XYZAL) 5 MG tablet Take 5 mg by mouth at bedtime. Patient not taking: Reported on 02/02/2022    [provider]  levothyroxine (SYNTHROID, LEVOTHROID) 112 MCG tablet Take 224 mcg by mouth daily before breakfast. 07/11/17   [provider]  lisinopril (ZESTRIL) 5 MG tablet Take 1 tablet (5 mg total) by mouth daily. Please hold this medication for three days, please check your blood pressure at home, bring in record for your pcp to review 02/03/22   Florencia Reasons, MD  loperamide (IMODIUM) 2 MG capsule Take 1 capsule (2 mg total) by mouth 4 (four) times daily as needed for diarrhea or loose stools. 03/09/22   Maudie Flakes, MD  loratadine (CLARITIN) 10 MG tablet Take 10 mg by mouth daily as needed for allergies.    [provider]  metFORMIN (GLUCOPHAGE) 500 MG tablet Take 1 tablet (500 mg total) by mouth 2 (two) times daily with a meal. 12/11/14   [name], MD  nitroGLYCERIN (NITROSTAT) 0.4 MG SL tablet Place 1 tablet (0.4 mg total) under the tongue every 5 (five) minutes as needed for chest pain. 06/19/18   [name], MD  omeprazole (PRILOSEC) 20 MG capsule Take 20 mg by mouth 2 (two) times daily as needed (acid reflux). 05/19/21   [provider]  ondansetron (ZOFRAN) 4 MG tablet Take 4 mg by mouth every 8 (eight) hours as needed for nausea or vomiting. 01/16/22   [provider]  oxyCODONE-acetaminophen (PERCOCET) 10-325 MG tablet Take 1 tablet by mouth every 6 (six) hours as needed for pain. 12/03/19   [provider]  OXYGEN Inhale 3 L into the lungs continuous.    [provider]  phentermine (ADIPEX-P) 37.5 MG tablet Take 37.5 mg by mouth daily. 05/25/21   [provider]  polyethylene glycol (MIRALAX / GLYCOLAX) 17 g packet Take 17 g by mouth daily as needed for mild constipation. 02/03/22   Florencia Reasons, MD  potassium chloride SA (K-DUR,KLOR-CON) 20 MEQ tablet Take 1  tablet (20 mEq total) by mouth 2 (two) times daily. 07/24/18   [name], MD  rosuvastatin (CRESTOR) 10 MG tablet Take 10 mg by mouth every evening. 11/19/19   [provider]  senna-docusate (SENOKOT-S) 8.6-50 MG tablet Take 1 tablet by mouth daily.    [provider]  torsemide (DEMADEX) 20 MG tablet TAKE 2 TABLETS (40 MG TOTAL) BY MOUTH 2 (TWO) TIMES DAILY. Patient taking differently: Take 60 mg by mouth 2 (two) times daily. 07/05/21   [name], MD  traZODone (DESYREL) 50 MG tablet Take 100 mg by mouth at bedtime. 11/19/19   [provider]  triamcinolone cream (KENALOG) 0.1 % Apply 1 application. topically 2 (two) times daily as needed (irrritation).  09/10/20   [provider]  umeclidinium-vilanterol (ANORO ELLIPTA) 62.5-25 MCG/INH AEPB Inhale 1 puff into the lungs daily.    [provider]  valACYclovir (VALTREX) 1000 MG tablet Take 1,000 mg by mouth daily. 01/19/22   [provider]      Allergies    Bee venom, Fioricet [butalbital-apap-caffeine], Ibuprofen, Lamisil [terbinafine], and Nsaids    Review of Systems   Review of Systems  Constitutional:  Negative for fever.  HENT:  Negative for facial swelling.   Eyes:  Negative for redness.  Respiratory:  Positive for cough and shortness of breath.   Cardiovascular:  Positive for chest pain and leg swelling.  Gastrointestinal:  Negative for vomiting.  Musculoskeletal:  Positive for arthralgias.    Physical Exam Updated Vital Signs BP 112/62   Pulse 79   Temp 97.8 F (36.6 C) (Oral)   Resp 17   Ht '5\' 5"'$  (1.651 m)   Wt 104.3 kg   SpO2 100%   BMI 38.27 kg/m  Physical Exam Vitals and nursing note reviewed.  Constitutional:      General: She is not in acute distress.    Appearance: Normal appearance. She is well-developed. She is not diaphoretic.  HENT:     Head: Normocephalic and atraumatic.     Nose: Nose normal.  Eyes:     Pupils: Pupils are equal, round,  and reactive to light.  Cardiovascular:     Rate and Rhythm: Normal rate and regular rhythm.     Pulses: Normal pulses.  Pulmonary:     Effort: Pulmonary effort is normal. No respiratory distress.     Breath sounds: No stridor. No rhonchi.  Abdominal:     General: Bowel sounds are normal. There is no distension.     Palpations: Abdomen is soft.     Tenderness: There is no abdominal tenderness. There is no guarding or rebound.  Genitourinary:    Vagina: No vaginal discharge.  Musculoskeletal:        General: No tenderness. Normal range of motion.     Cervical back: Neck supple.     Right lower leg: Edema present.     Left lower leg: Edema present.  Skin:    General: Skin is warm and dry.     Capillary Refill: Capillary refill takes less than 2 seconds.     Findings: No erythema or rash.  Neurological:     General: No focal deficit present.     Mental Status: She is alert and oriented to person, place, and time.     Deep Tendon Reflexes: Reflexes normal.  Psychiatric:        Mood and Affect: Mood normal.        Behavior: Behavior normal.     ED Results / Procedures / Treatments   Labs (all labs ordered are listed, but only abnormal results are displayed) Results for orders placed or performed during the hospital encounter of 05/14/22  Resp panel by RT-PCR (RSV, Flu A&B, Covid) Anterior Nasal Swab   Specimen: Anterior Nasal Swab  Result Value Ref Range   SARS Coronavirus 2 by RT PCR NEGATIVE NEGATIVE   Influenza A by PCR NEGATIVE NEGATIVE   Influenza B by PCR NEGATIVE NEGATIVE   Resp Syncytial Virus by PCR NEGATIVE NEGATIVE  CBC with Differential  Result Value Ref Range   WBC 12.4 (H) 4.0 - 10.5 K/uL   RBC 3.32 (L) 3.87 - 5.11 MIL/uL   Hemoglobin 10.2 (L) 12.0 - 15.0 g/dL  HCT 32.7 (L) 36.0 - 46.0 %   MCV 98.5 80.0 - 100.0 fL   MCH 30.7 26.0 - 34.0 pg   MCHC 31.2 30.0 - 36.0 g/dL   RDW 13.3 11.5 - 15.5 %   Platelets 263 150 - 400 K/uL   nRBC 0.0 0.0 - 0.2 %    Neutrophils Relative % 72 %   Neutro Abs 8.9 (H) 1.7 - 7.7 K/uL   Lymphocytes Relative 19 %   Lymphs Abs 2.3 0.7 - 4.0 K/uL   Monocytes Relative 6 %   Monocytes Absolute 0.8 0.1 - 1.0 K/uL   Eosinophils Relative 2 %   Eosinophils Absolute 0.3 0.0 - 0.5 K/uL   Basophils Relative 0 %   Basophils Absolute 0.1 0.0 - 0.1 K/uL   Immature Granulocytes 1 %   Abs Immature Granulocytes 0.08 (H) 0.00 - 0.07 K/uL  Basic metabolic panel  Result Value Ref Range   Sodium 136 135 - 145 mmol/L   Potassium 3.8 3.5 - 5.1 mmol/L   Chloride 97 (L) 98 - 111 mmol/L   CO2 31 22 - 32 mmol/L   Glucose, Bld 90 70 - 99 mg/dL   BUN 11 8 - 23 mg/dL   Creatinine, Ser 0.81 0.44 - 1.00 mg/dL   Calcium 8.5 (L) 8.9 - 10.3 mg/dL   GFR, Estimated >60 >60 mL/min   Anion gap 8 5 - 15  Brain natriuretic peptide  Result Value Ref Range   B Natriuretic Peptide 11.3 0.0 - 100.0 pg/mL  Troponin I (High Sensitivity)  Result Value Ref Range   Troponin I (High Sensitivity) 4 <18 ng/L   DG Chest Portable 1 View  Result Date: 05/14/2022 CLINICAL DATA:  Cough, weakness, leg swelling. EXAM: PORTABLE CHEST 1 VIEW COMPARISON:  01/31/2022. FINDINGS: Heart is enlarged and the mediastinal contour stable. There is atherosclerotic calcification of the aorta. The pulmonary vasculature is distended. Patchy airspace disease is present at the lung bases bilaterally. No effusion or pneumothorax. Surgical clips are noted in the cervical soft tissues. No acute osseous abnormality. IMPRESSION: 1. Cardiomegaly with pulmonary vascular congestion. 2. Patchy airspace disease at the lung bases, possible edema or infiltrate. Electronically Signed   By: Brett Fairy M.D.   On: 05/14/2022 23:37     EKG EKG Interpretation  Date/Time:  Sunday May 14 2022 23:16:45 EST Ventricular Rate:  79 PR Interval:  173 QRS Duration: 89 QT Interval:  378 QTC Calculation: 436 R Axis:   60 Text Interpretation: Sinus rhythm Low voltage, precordial leads  Confirmed by Randal Buba, Veralyn Lopp ([id number]) on 05/14/2022 11:37:49 PM  Radiology DG Chest Portable 1 View  Result Date: 05/14/2022 CLINICAL DATA:  Cough, weakness, leg swelling. EXAM: PORTABLE CHEST 1 VIEW COMPARISON:  01/31/2022. FINDINGS: Heart is enlarged and the mediastinal contour stable. There is atherosclerotic calcification of the aorta. The pulmonary vasculature is distended. Patchy airspace disease is present at the lung bases bilaterally. No effusion or pneumothorax. Surgical clips are noted in the cervical soft tissues. No acute osseous abnormality. IMPRESSION: 1. Cardiomegaly with pulmonary vascular congestion. 2. Patchy airspace disease at the lung bases, possible edema or infiltrate. Electronically Signed   By: Brett Fairy M.D.   On: 05/14/2022 23:37    Procedures Procedures    Medications Ordered in ED Medications  clonazePAM (KLONOPIN) tablet 2 mg (has no administration in time range)  insulin aspart (novoLOG) injection 0-15 Units (has no administration in time range)  insulin aspart (novoLOG) injection 0-5 Units (has no administration in  time range)  oxyCODONE-acetaminophen (PERCOCET/ROXICET) 5-325 MG per tablet 1 tablet (has no administration in time range)    And  oxyCODONE (Oxy IR/ROXICODONE) immediate release tablet 5 mg (has no administration in time range)  sodium chloride flush (NS) 0.9 % injection 3 mL (has no administration in time range)  sodium chloride flush (NS) 0.9 % injection 3 mL (has no administration in time range)  0.9 %  sodium chloride infusion (has no administration in time range)  acetaminophen (TYLENOL) tablet 650 mg (has no administration in time range)  ondansetron (ZOFRAN) injection 4 mg (has no administration in time range)  enoxaparin (LOVENOX) injection 40 mg (has no administration in time range)  furosemide (LASIX) injection 40 mg (has no administration in time range)  furosemide (LASIX) injection 40 mg (40 mg Intravenous Given 05/15/22 0140)   acetaminophen (TYLENOL) tablet 1,000 mg (1,000 mg Oral Given 05/15/22 0140)    ED Course/ Medical Decision Making/ A&P                             Medical Decision Making Patient with multiple complaints. Fluids overload, cough, upper chest pain.  Feet swelling and pain   Amount and/or Complexity of Data Reviewed Independent Historian: EMS    Details: See above  External Data Reviewed: labs and notes.    Details: Previous notes reviewed  Labs: ordered.    Details: All labs reviewed: normal sodium 136, normal potassium 3.8, normal creatinine .81.  Elevated white count 12.4, hemoglobin slightly low 10.2, normal platelets . Negative covid and flu.  Troponin normal 4 Radiology: ordered and independent interpretation performed.    Details: Pulmonary edema on CXR my me  ECG/medicine tests: ordered and independent interpretation performed. Decision-making details documented in ED Course.  Risk OTC drugs. Prescription drug management. Decision regarding hospitalization.    Final Clinical Impression(s) / ED Diagnoses Final diagnoses:  Acute congestive heart failure, unspecified heart failure type [facility])   The patient appears reasonably stabilized for admission considering the current resources, flow, and capabilities available in the ED at this time, and I doubt any other [facility] requiring further screening and/or treatment in the ED prior to admission.  Rx / DC Orders ED Discharge Orders     None         Marvalene Barrett, MD 05/15/22 [phone]

## 2022-05-15 NOTE — Assessment & Plan Note (Addendum)
Pt with anasarca, pulmonary edema.  Looks like R > L CHF. H/o HFpEF and PAH it looks like from chart review. BNP normal, but looks like hers always is even with fluid overload in past. CHF pathway Lasix '40mg'$  IV x1 in ED and '40mg'$  IV daily here. Strict intake and output Daily BMP 2d echo Tele monitor

## 2022-05-15 NOTE — Care Management (Signed)
  Transition of Care [facility]) Screening Note   Patient Details  Name: Michele Owens Date of Birth: [birth year]   Transition [facility] Care [facility]) CM/SW Contact:    Bethena Roys, RN Phone Number: 05/15/2022, 3:45 PM    Transition of Care Department [facility]) has reviewed the patient and no TOC needs have been identified at this time. We will continue to monitor patient advancement through interdisciplinary progression rounds. If new patient transition needs arise, please place a TOC consult.

## 2022-05-15 NOTE — ED Notes (Signed)
ED TO INPATIENT HANDOFF REPORT  ED Nurse Name and Phone #: Judson Roch [phone]  S Name/Age/Gender Michele Owens 62 y.o. female Room/Bed: 022C/022C  Code Status   Code Status: Full Code  Home/SNF/Other Home Patient oriented to: self, place, time, and situation Is this baseline? Yes   Triage Complete: Triage complete  Chief Complaint Acute pulmonary edema ([place]) [J81.0]  Triage Note Pt BIB [facility] from home w c/o of foot pain from chronic gout and chronic R shoulder pain. Pt missed her doctor's appointment last week and stated to EMS that was she going to miss her upcoming appt as well. Pt is ambulatory.   EMS VS BP 102/50 P 80 O2 96% on 3L; pt wears 3L at home as well  CBG 90   Allergies Allergies  Allergen Reactions   Bee Venom Swelling and Other (See Comments)    "All over my body" (swelling)   Fioricet [Butalbital-Apap-Caffeine] Nausea And Vomiting and Rash   Ibuprofen Rash and Other (See Comments)    Severe rash   Lamisil [Terbinafine] Rash and Other (See Comments)    Pt states this causes her to "feel funny"   Nsaids Other (See Comments)    Per MD's orders     Level of Care/Admitting Diagnosis ED Disposition     ED Disposition  Admit   Condition  --   [place]: [facility] [100100]  Level of Care: Telemetry Cardiac [103]  May place patient in observation at [facility] or [facility] if equivalent level of care is available:: No  Covid Evaluation: Confirmed COVID Negative  Diagnosis: Acute pulmonary edema [facility]) [696789]  Admitting Physician: Etta Quill [phone]  Attending Physician: Etta Quill [phone]          B Medical/Surgery History Past Medical History:  Diagnosis Date   Anxiety    Arthritis    Asthma    Chronic diastolic CHF (congestive heart failure) ([place])    COPD (chronic obstructive pulmonary disease) ([place])    Uses Oxygen at night   Depression    Diabetes mellitus without complication (HCC)     GERD (gastroesophageal reflux disease)    Gout    Headache    migraines   History of DVT of lower extremity    Hypertension    Thyroid cancer ([place]) 09/23/2016   Past Surgical History:  Procedure Laterality Date   BUNIONECTOMY Bilateral    CARDIAC CATHETERIZATION N/A 06/23/2015   Procedure: Right/Left Heart Cath and Coronary Angiography;  Surgeon: [name], MD;  Location: [facility];  Service: Cardiovascular;  Laterality: N/A;   COLONOSCOPY WITH PROPOFOL N/A 12/15/2015   Procedure: COLONOSCOPY WITH PROPOFOL;  Surgeon: [name], MD;  Location: [facility];  Service: Endoscopy;  Laterality: N/A;   RIGHT HEART CATH N/A 10/01/2019   Procedure: RIGHT HEART CATH;  Surgeon: [name], MD;  Location: [facility];  Service: Cardiovascular;  Laterality: N/A;   THYROIDECTOMY  09/19/2016   THYROIDECTOMY N/A 09/19/2016   Procedure: TOTAL THYROIDECTOMY;  Surgeon: [name], MD;  Location: [facility];  Service: General;  Laterality: N/A;   TONSILLECTOMY     TOTAL ABDOMINAL HYSTERECTOMY  07/14/10     A IV Location/Drains/Wounds Patient Lines/Drains/Airways Status     Active Line/Drains/Airways     Name Placement date Placement time Site Days   Peripheral IV 05/15/22 20 G Anterior;Left;Proximal Forearm 05/15/22  0037  Forearm  less than 1  Intake/Output Last 24 hours No intake or output data in the 24 hours ending 05/15/22 0415  Labs/Imaging Results for orders placed or performed during the hospital encounter of 05/14/22 (from the past 48 hour(s))  Resp panel by RT-PCR (RSV, Flu A&B, Covid) Anterior Nasal Swab     Status: None   Collection Time: 05/15/22 12:17 AM   Specimen: Anterior Nasal Swab  Result Value Ref Range   SARS Coronavirus 2 by RT PCR NEGATIVE NEGATIVE   Influenza A by PCR NEGATIVE NEGATIVE   Influenza B by PCR NEGATIVE NEGATIVE    Comment: (NOTE) The Xpert Xpress SARS-CoV-2/FLU/RSV plus assay is intended as an aid in the diagnosis  of influenza from Nasopharyngeal swab specimens and should not be used as a sole basis for treatment. Nasal washings and aspirates are unacceptable for Xpert Xpress SARS-CoV-2/FLU/RSV testing.  Fact Sheet for Patients: EntrepreneurPulse.com.au  Fact Sheet for Healthcare Providers: IncredibleEmployment.be  This test is not yet approved or cleared by the [place] FDA and has been authorized for detection and/or diagnosis of SARS-CoV-2 by FDA under an Emergency Use Authorization (EUA). This EUA will remain in effect (meaning this test can be used) for the duration of the COVID-19 declaration under Section 564(b)(1) of the Act, 21 U.S.C. section 360bbb-3(b)(1), unless the authorization is terminated or revoked.     Resp Syncytial Virus by PCR NEGATIVE NEGATIVE    Comment: (NOTE) Fact Sheet for Patients: EntrepreneurPulse.com.au  Fact Sheet for Healthcare Providers: IncredibleEmployment.be  This test is not yet approved or cleared by the [place] FDA and has been authorized for detection and/or diagnosis of SARS-CoV-2 by FDA under an Emergency Use Authorization (EUA). This EUA will remain in effect (meaning this test can be used) for the duration of the COVID-19 declaration under Section 564(b)(1) of the Act, 21 U.S.C. section 360bbb-3(b)(1), unless the authorization is terminated or revoked.  Performed at [facility], [place] [address], [place], [place] [address]   CBC with Differential     Status: Abnormal   Collection Time: 05/15/22 12:40 AM  Result Value Ref Range   WBC 12.4 (H) 4.0 - 10.5 K/uL   RBC 3.32 (L) 3.87 - 5.11 MIL/uL   Hemoglobin 10.2 (L) 12.0 - 15.0 g/dL   HCT 32.7 (L) 36.0 - 46.0 %   MCV 98.5 80.0 - 100.0 fL   MCH 30.7 26.0 - 34.0 pg   MCHC 31.2 30.0 - 36.0 g/dL   RDW 13.3 11.5 - 15.5 %   Platelets 263 150 - 400 K/uL   nRBC 0.0 0.0 - 0.2 %   Neutrophils Relative % 72 %    Neutro Abs 8.9 (H) 1.7 - 7.7 K/uL   Lymphocytes Relative 19 %   Lymphs Abs 2.3 0.7 - 4.0 K/uL   Monocytes Relative 6 %   Monocytes Absolute 0.8 0.1 - 1.0 K/uL   Eosinophils Relative 2 %   Eosinophils Absolute 0.3 0.0 - 0.5 K/uL   Basophils Relative 0 %   Basophils Absolute 0.1 0.0 - 0.1 K/uL   Immature Granulocytes 1 %   Abs Immature Granulocytes 0.08 (H) 0.00 - 0.07 K/uL    Comment: Performed at [facility] [address], [place],  [address]  Basic metabolic panel     Status: Abnormal   Collection Time: 05/15/22 12:40 AM  Result Value Ref Range   Sodium 136 135 - 145 mmol/L   Potassium 3.8 3.5 - 5.1 mmol/L   Chloride 97 (L) 98 -  111 mmol/L   CO2 31 22 - 32 mmol/L   Glucose, Bld 90 70 - 99 mg/dL    Comment: Glucose reference range applies only to samples taken after fasting for at least 8 hours.   BUN 11 8 - 23 mg/dL   Creatinine, Ser 0.81 0.44 - 1.00 mg/dL   Calcium 8.5 (L) 8.9 - 10.3 mg/dL   GFR, Estimated >60 >60 mL/min    Comment: (NOTE) Calculated using the CKD-EPI Creatinine Equation (2021)    Anion gap 8 5 - 15    Comment: Performed at [facility] [address] [place], [place] [address]  Troponin I (High Sensitivity)     Status: None   Collection Time: 05/15/22 12:40 AM  Result Value Ref Range   Troponin I (High Sensitivity) 4 <18 ng/L    Comment: (NOTE) Elevated high sensitivity troponin I (hsTnI) values and significant  changes across serial measurements may suggest ACS but many other  chronic and acute conditions are known to elevate hsTnI results.  Refer to the "Links" section for chest pain algorithms and additional  guidance. Performed at [facility], [place] [address], [place], [place] [address]   Brain natriuretic peptide     Status: None   Collection Time: 05/15/22 12:40 AM  Result Value Ref Range   B Natriuretic Peptide 11.3 0.0 - 100.0 pg/mL    Comment: Performed at [facility] [address] [place],  [place] [address]   DG Chest Portable 1 View  Result Date: 05/14/2022 CLINICAL DATA:  Cough, weakness, leg swelling. EXAM: PORTABLE CHEST 1 VIEW COMPARISON:  01/31/2022. FINDINGS: Heart is enlarged and the mediastinal contour stable. There is atherosclerotic calcification of the aorta. The pulmonary vasculature is distended. Patchy airspace disease is present at the lung bases bilaterally. No effusion or pneumothorax. Surgical clips are noted in the cervical soft tissues. No acute osseous abnormality. IMPRESSION: 1. Cardiomegaly with pulmonary vascular congestion. 2. Patchy airspace disease at the lung bases, possible edema or infiltrate. Electronically Signed   By: Brett Fairy M.D.   On: 05/14/2022 23:37    Pending Labs Unresulted Labs (From admission, onward)     Start     Ordered   05/16/22 9833  Basic metabolic panel  Daily,   R     Comments: As Scheduled for 5 days    05/15/22 0320            Vitals/Pain Today's Vitals   05/14/22 2345 05/15/22 0000 05/15/22 0244 05/15/22 0411  BP: 116/62 112/62  (!) 124/49  Pulse: 80 79  81  Resp: (!) '25 17  17  '$ Temp:      TempSrc:      SpO2: 100% 100%  99%  Weight:      Height:      PainSc:   Asleep     Isolation Precautions No active isolations  Medications Medications  clonazePAM (KLONOPIN) tablet 2 mg (has no administration in time range)  insulin aspart (novoLOG) injection 0-15 Units (has no administration in time range)  insulin aspart (novoLOG) injection 0-5 Units (has no administration in time range)  oxyCODONE-acetaminophen (PERCOCET/ROXICET) 5-325 MG per tablet 1 tablet (has no administration in time range)    And  oxyCODONE (Oxy IR/ROXICODONE) immediate release tablet 5 mg (has no administration in time range)  sodium chloride flush (NS) 0.9 % injection 3 mL (3 mLs Intravenous Not Given 05/15/22 0332)  sodium chloride flush (NS) 0.9 % injection 3 mL (  has no administration in time range)  0.9 %  sodium chloride infusion (has no  administration in time range)  acetaminophen (TYLENOL) tablet 650 mg (has no administration in time range)  ondansetron (ZOFRAN) injection 4 mg (has no administration in time range)  enoxaparin (LOVENOX) injection 40 mg (has no administration in time range)  furosemide (LASIX) injection 40 mg (has no administration in time range)  cefTRIAXone (ROCEPHIN) 1 g in sodium chloride 0.9 % 100 mL IVPB (has no administration in time range)  azithromycin (ZITHROMAX) 500 mg in sodium chloride 0.9 % 250 mL IVPB (has no administration in time range)  furosemide (LASIX) injection 40 mg (40 mg Intravenous Given 05/15/22 0140)  acetaminophen (TYLENOL) tablet 1,000 mg (1,000 mg Oral Given 05/15/22 0140)    Mobility walks with device     Focused Assessments Pulmonary Assessment Handoff:  Lung sounds:   O2 Device: Nasal Cannula O2 Flow Rate (L/min): 3 L/min    R Recommendations: See Admitting Provider Note  Report given to:   Additional Notes:

## 2022-05-15 NOTE — Assessment & Plan Note (Signed)
Cont TID Klonapin.  Current script confirmed via PMP aware filled 04/26/22.

## 2022-05-16 DIAGNOSIS — Z85850 Personal history of malignant neoplasm of thyroid: Secondary | ICD-10-CM | POA: Diagnosis not present

## 2022-05-16 DIAGNOSIS — Z7984 Long term (current) use of oral hypoglycemic drugs: Secondary | ICD-10-CM | POA: Diagnosis not present

## 2022-05-16 DIAGNOSIS — Z9981 Dependence on supplemental oxygen: Secondary | ICD-10-CM | POA: Diagnosis not present

## 2022-05-16 DIAGNOSIS — I5033 Acute on chronic diastolic (congestive) heart failure: Secondary | ICD-10-CM | POA: Diagnosis not present

## 2022-05-16 DIAGNOSIS — E890 Postprocedural hypothyroidism: Secondary | ICD-10-CM | POA: Diagnosis not present

## 2022-05-16 DIAGNOSIS — Z79890 Hormone replacement therapy (postmenopausal): Secondary | ICD-10-CM | POA: Diagnosis not present

## 2022-05-16 DIAGNOSIS — F419 Anxiety disorder, unspecified: Secondary | ICD-10-CM | POA: Diagnosis not present

## 2022-05-16 DIAGNOSIS — D649 Anemia, unspecified: Secondary | ICD-10-CM | POA: Diagnosis not present

## 2022-05-16 DIAGNOSIS — Z1152 Encounter for screening for COVID-19: Secondary | ICD-10-CM | POA: Diagnosis not present

## 2022-05-16 DIAGNOSIS — J810 Acute pulmonary edema: Secondary | ICD-10-CM | POA: Diagnosis not present

## 2022-05-16 DIAGNOSIS — J4489 Other specified chronic obstructive pulmonary disease: Secondary | ICD-10-CM | POA: Diagnosis present

## 2022-05-16 DIAGNOSIS — M109 Gout, unspecified: Secondary | ICD-10-CM | POA: Diagnosis present

## 2022-05-16 DIAGNOSIS — E1169 Type 2 diabetes mellitus with other specified complication: Secondary | ICD-10-CM | POA: Diagnosis not present

## 2022-05-16 DIAGNOSIS — I5031 Acute diastolic (congestive) heart failure: Secondary | ICD-10-CM | POA: Diagnosis present

## 2022-05-16 DIAGNOSIS — Z79899 Other long term (current) drug therapy: Secondary | ICD-10-CM | POA: Diagnosis not present

## 2022-05-16 DIAGNOSIS — K219 Gastro-esophageal reflux disease without esophagitis: Secondary | ICD-10-CM | POA: Diagnosis not present

## 2022-05-16 DIAGNOSIS — Z808 Family history of malignant neoplasm of other organs or systems: Secondary | ICD-10-CM | POA: Diagnosis not present

## 2022-05-16 DIAGNOSIS — M1990 Unspecified osteoarthritis, unspecified site: Secondary | ICD-10-CM | POA: Diagnosis not present

## 2022-05-16 DIAGNOSIS — F32A Depression, unspecified: Secondary | ICD-10-CM | POA: Diagnosis not present

## 2022-05-16 DIAGNOSIS — Z86718 Personal history of other venous thrombosis and embolism: Secondary | ICD-10-CM | POA: Diagnosis not present

## 2022-05-16 DIAGNOSIS — Z833 Family history of diabetes mellitus: Secondary | ICD-10-CM | POA: Diagnosis not present

## 2022-05-16 DIAGNOSIS — G8929 Other chronic pain: Secondary | ICD-10-CM | POA: Diagnosis not present

## 2022-05-16 DIAGNOSIS — E785 Hyperlipidemia, unspecified: Secondary | ICD-10-CM | POA: Diagnosis not present

## 2022-05-16 DIAGNOSIS — Z7982 Long term (current) use of aspirin: Secondary | ICD-10-CM | POA: Diagnosis not present

## 2022-05-16 DIAGNOSIS — Z87891 Personal history of nicotine dependence: Secondary | ICD-10-CM | POA: Diagnosis not present

## 2022-05-16 DIAGNOSIS — I110 Hypertensive heart disease with heart failure: Secondary | ICD-10-CM | POA: Diagnosis not present

## 2022-05-16 LAB — GLUCOSE, CAPILLARY
Glucose-Capillary: 110 mg/dL — ABNORMAL HIGH (ref 70–99)
Glucose-Capillary: 113 mg/dL — ABNORMAL HIGH (ref 70–99)
Glucose-Capillary: 114 mg/dL — ABNORMAL HIGH (ref 70–99)
Glucose-Capillary: 139 mg/dL — ABNORMAL HIGH (ref 70–99)

## 2022-05-16 LAB — BASIC METABOLIC PANEL
Anion gap: 7 (ref 5–15)
BUN: 9 mg/dL (ref 8–23)
CO2: 31 mmol/L (ref 22–32)
Calcium: 8.5 mg/dL — ABNORMAL LOW (ref 8.9–10.3)
Chloride: 99 mmol/L (ref 98–111)
Creatinine, Ser: 0.7 mg/dL (ref 0.44–1.00)
GFR, Estimated: >60 mL/min (ref >60)
Glucose, Bld: 102 mg/dL — ABNORMAL HIGH (ref 70–99)
Potassium: 3.7 mmol/L (ref 3.5–5.1)
Sodium: 137 mmol/L (ref 135–145)

## 2022-05-16 LAB — CBC
HCT: 35.8 % — ABNORMAL LOW (ref 36.0–46.0)
Hemoglobin: 11 g/dL — ABNORMAL LOW (ref 12.0–15.0)
MCH: 30.3 pg (ref 26.0–34.0)
MCHC: 30.7 g/dL (ref 30.0–36.0)
MCV: 98.6 fL (ref 80.0–100.0)
Platelets: 275 10*3/uL (ref 150–400)
RBC: 3.63 MIL/uL — ABNORMAL LOW (ref 3.87–5.11)
RDW: 13.2 % (ref 11.5–15.5)
WBC: 11.1 10*3/uL — ABNORMAL HIGH (ref 4.0–10.5)
nRBC: 0 % (ref 0.0–0.2)

## 2022-05-16 LAB — MAGNESIUM: Magnesium: 1.9 mg/dL (ref 1.7–2.4)

## 2022-05-16 LAB — PHOSPHORUS: Phosphorus: 3.6 mg/dL (ref 2.5–4.6)

## 2022-05-16 NOTE — Evaluation (Signed)
Occupational Therapy Evaluation Patient Details Name: Michele Owens MRN: [id number] DOB: 1961-01-11 Today's Date: 05/16/2022   History of Present Illness Pt is a 62 y/o F presenting to ED on 2/5 wtih foot and R shoulder pain and SOB, admitted for acute pulmonary edema. PMH includes anxiety, arthritis, asthma, CHF, COPD, depression, DM, GERD, gout, headache, and HTN.   Clinical Impression   Pt reports having assist at baseline for bathing, and uses cane for mobility, lives alone in first floor apartment and has an aide that comes Mon-Sat "for most of the day", pt unable to provide timeframe. Pt needing supervision for bed mobility able to sit EOB x10 min, and min-mod A for ADLs, pt declining OOB mobility at this time despite encouragement, pt stating she "isn't ready to get up yet". Pt presenting with impairments listed below, will follow acutely. Recommend HHOT given pt's aide can provide level of assist needed upon d/c.     Recommendations for follow up therapy are one component of a multi-disciplinary discharge planning process, led by the attending physician.  Recommendations may be updated based on patient status, additional functional criteria and insurance authorization.   Follow Up Recommendations  Home health OT (pending progression and given aide can provide level of assist needed)     Assistance Recommended at Discharge Intermittent Supervision/Assistance  Patient can return home with the following A little help with walking and/or transfers;A lot of help with bathing/dressing/bathroom;Assistance with cooking/housework;Direct supervision/assist for medications management;Direct supervision/assist for financial management;Assist for transportation;Help with stairs or ramp for entrance    Functional Status Assessment  Patient has had a recent decline in their functional status and demonstrates the ability to make significant improvements in function in a reasonable and predictable  amount of time.  Equipment Recommendations  None recommended by OT (pt has needed DME)    Recommendations for Other Services PT consult     Precautions / Restrictions Precautions Precautions: Fall Restrictions Weight Bearing Restrictions: No      Mobility Bed Mobility Overal bed mobility: Needs Assistance Bed Mobility: Supine to Sit, Sit to Supine     Supine to sit: Supervision Sit to supine: Supervision        Transfers                   General transfer comment: pt declining standing or getting OOB at this time      Balance Overall balance assessment: Needs assistance Sitting-balance support: Feet supported Sitting balance-Leahy Scale: Fair Sitting balance - Comments: sits unsupported at EOB                                   ADL either performed or assessed with clinical judgement   ADL Overall ADL's : Needs assistance/impaired Eating/Feeding: Modified independent   Grooming: Set up   Upper Body Bathing: Minimal assistance   Lower Body Bathing: Moderate assistance   Upper Body Dressing : Minimal assistance   Lower Body Dressing: Moderate assistance   Toilet Transfer: Minimal assistance;Moderate assistance;Stand-pivot;BSC/3in1   Toileting- Clothing Manipulation and Hygiene: Moderate assistance       Functional mobility during ADLs: Minimal assistance;Moderate assistance       Vision Baseline Vision/History: 1 Wears glasses Vision Assessment?: No apparent visual deficits     Perception Perception Perception Tested?: No   Praxis Praxis Praxis tested?: Not tested    Pertinent Vitals/Pain Pain Assessment Pain Assessment: Faces Pain Score: 5  Faces  Pain Scale: Hurts even more Pain Location: stomach Pain Descriptors / Indicators: Discomfort Pain Intervention(s): Limited activity within patient's tolerance, Monitored during session, Repositioned     Hand Dominance Right   Extremity/Trunk Assessment Upper Extremity  Assessment Upper Extremity Assessment: Generalized weakness   Lower Extremity Assessment Lower Extremity Assessment: Defer to PT evaluation       Communication Communication Communication: No difficulties   Cognition Arousal/Alertness: Awake/alert Behavior During Therapy: WFL for tasks assessed/performed Overall Cognitive Status: No family/caregiver present to determine baseline cognitive functioning                                 General Comments: educated that mobilizing may help stomach pain/indigestion  pt reports she needs to rest     General Comments  VSS on 4L O2    Exercises     Shoulder Instructions      Home Living Family/patient expects to be discharged to:: Private residence Living Arrangements: Alone Available Help at Discharge: Other (Comment) (none) Type of Home: Apartment Home Access: Level entry     Home Layout: One level     Bathroom Shower/Tub: Tub/shower unit         Home Equipment: Cane - single Location manager (2 wheels)   Additional Comments: aide Mon-Sat "most of the day" unable to provide time frame      Prior Functioning/Environment Prior Level of Function : Independent/Modified Independent             Mobility Comments: uses cane, also reports pushing a chair ADLs Comments: aide assist with bathing, cleans        OT Problem List: Decreased strength;Decreased range of motion;Decreased activity tolerance;Impaired balance (sitting and/or standing);Decreased safety awareness;Cardiopulmonary status limiting activity      OT Treatment/Interventions: Self-care/ADL training;Therapeutic exercise;Energy conservation;DME and/or AE instruction;Therapeutic activities;Patient/family education;Balance training    OT Goals(Current goals can be found in the care plan section) Acute Rehab OT Goals Patient Stated Goal: none stated OT Goal Formulation: With patient Time For Goal Achievement:  05/30/22 Potential to Achieve Goals: Good  OT Frequency: Min 2X/week    Co-evaluation              AM-PAC OT "6 Clicks" Daily Activity     Outcome Measure Help from another person eating meals?: None Help from another person taking care of personal grooming?: A Little Help from another person toileting, which includes using toliet, bedpan, or urinal?: A Little Help from another person bathing (including washing, rinsing, drying)?: A Lot Help from another person to put on and taking off regular upper body clothing?: A Little Help from another person to put on and taking off regular lower body clothing?: A Lot 6 Click Score: 17   End of Session Equipment Utilized During Treatment: Oxygen (4L) Nurse Communication: Mobility status  Activity Tolerance: Patient limited by pain;Other (comment) (self limiting) Patient left: in bed;with bed alarm set;with call bell/phone within reach  OT Visit Diagnosis: Unsteadiness on feet (R26.81);Other abnormalities of gait and mobility (R26.89);Muscle weakness (generalized) (M62.81)                Time: 1130-1150 OT Time Calculation (min): 20 min Charges:  OT General Charges $OT Visit: 1 Visit OT Evaluation $OT Eval Moderate Complexity: 1 Mod  Katerina Zurn K, OTD, OTR/L SecureChat Preferred Acute Rehab [phone]  Renaye Rakers [place] 05/16/2022, 12:34 PM

## 2022-05-16 NOTE — Plan of Care (Signed)

## 2022-05-16 NOTE — Progress Notes (Signed)
OT Cancellation Note  Patient Details Name: Rebekha Diveley MRN: [id number] DOB: 11-27-1960   Cancelled Treatment:    Reason Eval/Treat Not Completed: Patient declined, no reason specified (pt reporting pain and she does not feel like "getting up right now". Will follow up for OT evaluation as schedule permits.)  Renaye Rakers, OTD, OTR/L SecureChat Preferred Acute Rehab [phone] - [place] 05/16/2022, 8:11 AM

## 2022-05-16 NOTE — Evaluation (Signed)
Physical Therapy Evaluation Patient Details Name: Michele Owens MRN: [id number] DOB: 1960-10-22 Today's Date: 05/16/2022  History of Present Illness  Pt is a 62 y/o F presenting to ED on 2/5 wtih foot and R shoulder pain and SOB, admitted for acute pulmonary edema. PMH includes anxiety, arthritis, asthma, CHF, COPD, depression, DM, GERD, gout, headache, and HTN.  Clinical Impression   Pt presents with generalized weakness, impaired standing balance, poor activity tolerance vs baseline. Pt to benefit from acute PT to address deficits. Pt ambulated short room distance, declines use of AD but reaches for environment throughout session. Pt on 4LO2, overall requiring light physical assist for transfers at this time. Pt adamant that she wants to d/c home with support of her aide and HHPT. PT to progress mobility as tolerated, and will continue to follow acutely.         Recommendations for follow up therapy are one component of a multi-disciplinary discharge planning process, led by the attending physician.  Recommendations may be updated based on patient status, additional functional criteria and insurance authorization.  Follow Up Recommendations Home health PT      Assistance Recommended at Discharge Intermittent Supervision/Assistance  Patient can return home with the following  A little help with walking and/or transfers;A little help with bathing/dressing/bathroom    Equipment Recommendations None recommended by PT  Recommendations for Other Services       Functional Status Assessment Patient has had a recent decline in their functional status and demonstrates the ability to make significant improvements in function in a reasonable and predictable amount of time.     Precautions / Restrictions Precautions Precautions: Fall Restrictions Weight Bearing Restrictions: No      Mobility  Bed Mobility Overal bed mobility: Needs Assistance Bed Mobility: Supine to Sit, Sit to Supine      Supine to sit: Min assist, HOB elevated Sit to supine: Min assist, HOB elevated   General bed mobility comments: scooting to EOB    Transfers Overall transfer level: Needs assistance Equipment used: 1 person hand held assist Transfers: Sit to/from Stand Sit to Stand: Min assist           General transfer comment: light rise and steadying assist    Ambulation/Gait Ambulation/Gait assistance: Min guard Gait Distance (Feet): 10 Feet Assistive device: 1 person hand held assist Gait Pattern/deviations: Step-through pattern, Decreased stride length, Shuffle Gait velocity: decr     General Gait Details: close guard for safety, pt reaching for environment  Stairs            Wheelchair Mobility    Modified Rankin (Stroke Patients Only)       Balance Overall balance assessment: Needs assistance Sitting-balance support: Feet supported Sitting balance-Leahy Scale: Fair       Standing balance-Leahy Scale: Fair                               Pertinent Vitals/Pain Pain Assessment Pain Assessment: Faces Faces Pain Scale: Hurts even more Pain Location: stomach Pain Descriptors / Indicators: Discomfort Pain Intervention(s): Limited activity within patient's tolerance, Monitored during session, Repositioned    Home Living Family/patient expects to be discharged to:: Private residence Living Arrangements: Alone Available Help at Discharge: Other (Comment) (none) Type of Home: Apartment Home Access: Level entry       Home Layout: One level Home Equipment: Cane - single Location manager (2 wheels);Rollator (4 wheels) Additional Comments: aide Mon-Sat "most of  the day" unable to provide time frame    Prior Function Prior Level of Function : Independent/Modified Independent             Mobility Comments: uses cane, also reports pushing a chair ADLs Comments: aide assist with bathing, cleans     Hand Dominance    Dominant Hand: Right    Extremity/Trunk Assessment   Upper Extremity Assessment Upper Extremity Assessment: Defer to OT evaluation    Lower Extremity Assessment Lower Extremity Assessment: Generalized weakness    Cervical / Trunk Assessment Cervical / Trunk Assessment: Normal  Communication   Communication: No difficulties  Cognition Arousal/Alertness: Awake/alert Behavior During Therapy: WFL for tasks assessed/performed Overall Cognitive Status: No family/caregiver present to determine baseline cognitive functioning                                          General Comments General comments (skin integrity, edema, etc.): 4LO2    Exercises     Assessment/Plan    PT Assessment Patient needs continued PT services  PT Problem List Decreased mobility;Decreased strength;Decreased range of motion;Decreased activity tolerance;Decreased balance;Decreased knowledge of use of DME;Pain;Decreased safety awareness       PT Treatment Interventions DME instruction;Therapeutic activities;Gait training;Patient/family education;Therapeutic exercise;Balance training;Functional mobility training;Neuromuscular re-education    PT Goals (Current goals can be found in the Care Plan section)  Acute Rehab PT Goals Patient Stated Goal: home PT Goal Formulation: With patient Time For Goal Achievement: 05/30/22 Potential to Achieve Goals: Good    Frequency Min 3X/week     Co-evaluation               AM-PAC PT "6 Clicks" Mobility  Outcome Measure Help needed turning from your back to your side while in a flat bed without using bedrails?: A Little Help needed moving from lying on your back to sitting on the side of a flat bed without using bedrails?: A Little Help needed moving to and from a bed to a chair (including a wheelchair)?: A Little Help needed standing up from a chair using your arms (e.g., wheelchair or bedside chair)?: A Little Help needed to walk in  hospital room?: A Little Help needed climbing 3-5 steps with a railing? : A Lot 6 Click Score: 17    End of Session   Activity Tolerance: Patient tolerated treatment well Patient left: in bed;with call bell/phone within reach;with bed alarm set Nurse Communication: Mobility status PT Visit Diagnosis: Other abnormalities of gait and mobility (R26.89);Muscle weakness (generalized) (M62.81)    Time: 9476-5465 PT Time Calculation (min) (ACUTE ONLY): 22 min   Charges:   PT Evaluation $PT Eval Low Complexity: 1 Low         Maysoon Lozada S, PT DPT Acute Rehabilitation Services Pager [phone]  Office [phone]   Louis Matte 05/16/2022, 4:36 PM

## 2022-05-16 NOTE — Progress Notes (Signed)
CSW received consult for utility resources for patient. CSW spoke with patient at bedside. CSW offered patient [facility] resource guide. Patient accepted. All questions answered. No further questions reported at this time.

## 2022-05-16 NOTE — Progress Notes (Signed)
  Progress Note   Patient: Michele Owens MMI:[id number] DOB: 03/23/1961 DOA: 05/14/2022     0 DOS: the patient was seen and examined on 05/16/2022   Brief hospital course:  Assessment and Plan:  * Acute HFpEF exacerbation w/ acute pulmonary edema (HCC) - IV lasix 40 mg daily  - Cardiac monitoring  - Daily weight - Strict I and O  - ECHO LVEF 55-60%, no WMA. Grade I diastolic dysfunction    Benign essential HTN - Lisinopril 5 mg PO daily  - Coreg 3.125 mg PO bid    Controlled type 2 diabetes mellitus without complication, without long-term current use of insulin (HCC) - Novolog SS ACHS    Chronic pain disorder - Percocet 1 tablet PO q6 hr PRN    Normocytic anemia - Monitor CBC daily    Anxiety and depression - Clonazepam 2 mg PO tid    Dyslipidemia associated with type 2 diabetes mellitus (HCC) - Crestor 10 mg PO daily    DVT prophylaxis: Lovenox 40 mg sq daily       Subjective: Pt seen and examined at the bedside. She continues on IV lasix. PT/OT was consulted but pt did not work with OT today. ECHO appreciated. Titrate oxygen as tolerated.   Physical Exam: Vitals:   05/15/22 2000 05/15/22 2359 05/16/22 0420 05/16/22 0802  BP: 138/73 137/67 123/62 129/80  Pulse: 83 86 77 78  Resp: '18 17 19 19  '$ Temp: (!) 97.4 F (36.3 C) 98 F (36.7 C) 98.1 F (36.7 C) (!) 97.2 F (36.2 C)  TempSrc: Oral Oral Oral Oral  SpO2: 94% 93% 96% 92%  Weight:   125.1 kg   Height:       Constitutional:      Appearance: Normal appearance.  HENT:     Head: Normocephalic and atraumatic.     Mouth/Throat:     Mouth: Mucous membranes are moist.  Cardiovascular:     Rate and Rhythm: Normal rate and regular rhythm.  Pulmonary:     Effort: Pulmonary effort is normal.     Comments: Decreased breath sounds at the bases  Musculoskeletal:     Cervical back: Neck supple.  Skin:    General: Skin is warm.     Comments: Trace edema b/l lower legs   Neurological:     Mental Status: She is  alert.   Data Reviewed:   Disposition: Status is: Observation  Planned Discharge Destination: Barriers to discharge: Willingness of pt to work with PT/OT     Time spent: 35 minutes  Author: Lucienne Minks , MD 05/16/2022 10:16 AM  For on call review [email or web address].

## 2022-05-17 DIAGNOSIS — J810 Acute pulmonary edema: Secondary | ICD-10-CM | POA: Diagnosis not present

## 2022-05-17 LAB — CBC
HCT: 37 % (ref 36.0–46.0)
Hemoglobin: 11.5 g/dL — ABNORMAL LOW (ref 12.0–15.0)
MCH: 30.1 pg (ref 26.0–34.0)
MCHC: 31.1 g/dL (ref 30.0–36.0)
MCV: 96.9 fL (ref 80.0–100.0)
Platelets: 293 10*3/uL (ref 150–400)
RBC: 3.82 MIL/uL — ABNORMAL LOW (ref 3.87–5.11)
RDW: 13.1 % (ref 11.5–15.5)
WBC: 11.9 10*3/uL — ABNORMAL HIGH (ref 4.0–10.5)
nRBC: 0 % (ref 0.0–0.2)

## 2022-05-17 LAB — BASIC METABOLIC PANEL
Anion gap: 8 (ref 5–15)
BUN: 7 mg/dL — ABNORMAL LOW (ref 8–23)
CO2: 30 mmol/L (ref 22–32)
Calcium: 8.8 mg/dL — ABNORMAL LOW (ref 8.9–10.3)
Chloride: 99 mmol/L (ref 98–111)
Creatinine, Ser: 0.7 mg/dL (ref 0.44–1.00)
GFR, Estimated: >60 mL/min (ref >60)
Glucose, Bld: 101 mg/dL — ABNORMAL HIGH (ref 70–99)
Potassium: 3.7 mmol/L (ref 3.5–5.1)
Sodium: 137 mmol/L (ref 135–145)

## 2022-05-17 LAB — GLUCOSE, CAPILLARY
Glucose-Capillary: 159 mg/dL — ABNORMAL HIGH (ref 70–99)
Glucose-Capillary: 123 mg/dL — ABNORMAL HIGH (ref 70–99)
Glucose-Capillary: 157 mg/dL — ABNORMAL HIGH (ref 70–99)
Glucose-Capillary: 132 mg/dL — ABNORMAL HIGH (ref 70–99)

## 2022-05-17 LAB — MAGNESIUM: Magnesium: 2 mg/dL (ref 1.7–2.4)

## 2022-05-17 MED ORDER — LEVOTHYROXINE SODIUM 112 MCG PO TABS
224.0000 ug | ORAL_TABLET | Freq: Every day | ORAL | Status: DC
  Administered 2022-05-17 – 2022-05-18 (×2): 224 ug via ORAL
  Filled 2022-05-17 (×2): qty 2

## 2022-05-17 NOTE — Progress Notes (Signed)
  Progress Note   Patient: Michele Owens MWN:[id number] DOB: 1960-05-17 DOA: 05/14/2022     1 DOS: the patient was seen and examined on 05/17/2022   Brief hospital course:  Assessment and Plan: * Acute HFpEF exacerbation w/ acute pulmonary edema (HCC) - IV lasix 40 mg daily  - Cardiac monitoring  - Daily weight - Strict I and O  - ECHO LVEF 55-60%, no WMA. Grade I diastolic dysfunction    Benign essential HTN - Lisinopril 5 mg PO daily  - Coreg 3.125 mg PO bid    Controlled type 2 diabetes mellitus without complication, without long-term current use of insulin (HCC) - Novolog SS ACHS    Chronic pain disorder - Percocet 1 tablet PO q6 hr PRN    Normocytic anemia - Monitor CBC daily    Anxiety and depression - Clonazepam 2 mg PO tid    Dyslipidemia associated with type 2 diabetes mellitus (HCC) - Crestor 10 mg PO daily   Hypothyroisidm  - Synthroid 224 mcg PO daily    DVT prophylaxis: Lovenox 40 mg sq daily       Subjective: Pt seen and examined at the bedside. This morning we spoke about participating more with PT/OT when they come into the room. Pt understood that her disposition depends on her willingness to participate. Electrolytes are stable. Continue with IV lasix and working with PT/OT (pt apparently wants to go home and not rehab).  Physical Exam: Vitals:   05/17/22 0354 05/17/22 0800 05/17/22 0900 05/17/22 1051  BP: 137/72 130/70 126/70   Pulse: 85 88    Resp: 19 18    Temp: 97.6 F (36.4 C) 97.9 F (36.6 C)    TempSrc: Oral Oral    SpO2: 95% 96%  93%  Weight: 122.2 kg     Height:       Constitutional:      Appearance: Normal appearance.  HENT:     Head: Normocephalic and atraumatic.     Mouth/Throat:     Mouth: Mucous membranes are moist.  Cardiovascular:     Rate and Rhythm: Normal rate and regular rhythm.  Pulmonary:     Effort: Pulmonary effort is normal.     Comments: Improved aeration b/l Musculoskeletal:     Cervical back: Neck supple.   Skin:    General: Skin is warm.     Comments: No edema in the lower legs  Neurological:     Mental Status: She is alert.   Data Reviewed:   Disposition: Status is: Inpatient  Planned Discharge Destination: Home    Time spent: 35 minutes  Author: Lucienne Minks , MD 05/17/2022 11:42 AM  For on call review [email or web address].

## 2022-05-17 NOTE — Progress Notes (Signed)
Physical Therapy Treatment Patient Details Name: Michele Owens MRN: [id number] DOB: [birth year] Today's Date: 05/17/2022   History of Present Illness Pt is a 62 y/o F presenting to ED on 2/5 wtih foot and R shoulder pain and SOB, admitted for acute pulmonary edema. PMH includes anxiety, arthritis, asthma, CHF, COPD, depression, DM, GERD, gout, headache, and HTN.    PT Comments    Patent progressing with hallway ambulation and able to complete several ADL tasks on her own.  She declined wearing oxygen in hallway, but noted no dyspnea.  She should be able to return home with intermittent assist with her aide and follow up HHPT.  PT will continue to follow.   Recommendations for follow up therapy are one component of a multi-disciplinary discharge planning process, led by the attending physician.  Recommendations may be updated based on patient status, additional functional criteria and insurance authorization.  Follow Up Recommendations  Home health PT     Assistance Recommended at Discharge Intermittent Supervision/Assistance  Patient can return home with the following A little help with walking and/or transfers;A little help with bathing/dressing/bathroom   Equipment Recommendations  None recommended by PT    Recommendations for Other Services       Precautions / Restrictions Precautions Precautions: Fall Precaution Comments: O2 dependent at home     Mobility  Bed Mobility Overal bed mobility: Modified Independent                  Transfers Overall transfer level: Needs assistance Equipment used: Rolling walker (2 wheels) Transfers: Sit to/from Stand, Bed to chair/wheelchair/BSC Sit to Stand: Min guard   Step pivot transfers: Supervision       General transfer comment: increased time to rise from EOB with multiple attempts, minguard for balance/safety    Ambulation/Gait Ambulation/Gait assistance: Supervision, Min guard Gait Distance (Feet): 350  Feet Assistive device: Rolling walker (2 wheels) Gait Pattern/deviations: Step-through pattern       General Gait Details: wearing her tennis shoes, at time running into obstacles in hallway with walker, self corrected and minguard   Stairs             Wheelchair Mobility    Modified Rankin (Stroke Patients Only)       Balance Overall balance assessment: Needs assistance   Sitting balance-Leahy Scale: Good Sitting balance - Comments: leaning over to apply compression socks and tennis shoes   Standing balance support: Single extremity supported, No upper extremity supported, During functional activity Standing balance-Leahy Scale: Fair Standing balance comment: standing at sink for perineal hygiene after toileting on [facility]                            Cognition Arousal/Alertness: Awake/alert Behavior During Therapy: WFL for tasks assessed/performed Overall Cognitive Status: History of cognitive impairments - at baseline                                          Exercises      General Comments General comments (skin integrity, edema, etc.): on RA for ambulation, no dyspnea noted      Pertinent Vitals/Pain Pain Assessment Pain Score: 4  Pain Location: knees Pain Descriptors / Indicators: Discomfort, Aching Pain Intervention(s): Monitored during session    Home Living  Prior Function            PT Goals (current goals can now be found in the care plan section) Progress towards PT goals: Progressing toward goals    Frequency    Min 3X/week      PT Plan Current plan remains appropriate    Co-evaluation              AM-PAC PT "6 Clicks" Mobility   Outcome Measure  Help needed turning from your back to your side while in a flat bed without using bedrails?: None Help needed moving from lying on your back to sitting on the side of a flat bed without using bedrails?: None Help needed  moving to and from a bed to a chair (including a wheelchair)?: A Little Help needed standing up from a chair using your arms (e.g., wheelchair or bedside chair)?: A Little Help needed to walk in hospital room?: None Help needed climbing 3-5 steps with a railing? : Total 6 Click Score: 19    End of Session   Activity Tolerance: Patient tolerated treatment well Patient left: in chair;with call bell/phone within reach   PT Visit Diagnosis: Other abnormalities of gait and mobility (R26.89);Muscle weakness (generalized) (M62.81)     Time: 1620-1700 PT Time Calculation (min) (ACUTE ONLY): 40 min  Charges:  $Gait Training: 8-22 mins $Therapeutic Activity: 23-37 mins                     Magda Kiel, PT Acute Rehabilitation Services Office:[phone] 05/17/2022    Reginia Naas 05/17/2022, 5:36 PM

## 2022-05-17 NOTE — Progress Notes (Signed)
Occupational Therapy Treatment Patient Details Name: Michele Owens MRN: [id number] DOB: 1960/10/27 Today's Date: 05/17/2022   History of present illness Pt is a 62 y/o F presenting to ED on 2/5 wtih foot and R shoulder pain and SOB, admitted for acute pulmonary edema. PMH includes anxiety, arthritis, asthma, CHF, COPD, depression, DM, GERD, gout, headache, and HTN.   OT comments  Pt seen for OT ADL retraining session today with focus on toileting (min A for sit to stand from 3:1 using SPC), grooming (set-up assist seated at sink), bathing (set up UB, Min A LB, pt uses figure 4 technique sitting in chair at sink level), and dressing (set up assist UB, Min A LB seated at sink). Pt is making nice gains with ADL's and functional transfers. She plans to d/c home with PRN HHA support Mon-Sat stating "I do not want to go to Rehab". Continue to recommend Michele Owens for home assessment and recommendations.   Recommendations for follow up therapy are one component of a multi-disciplinary discharge planning process, led by the attending physician.  Recommendations may be updated based on patient status, additional functional criteria and insurance authorization.    Follow Up Recommendations  Home health OT (pending progression and given aide can provide level of assist needed)     Assistance Recommended at Discharge Intermittent Supervision/Assistance  Patient can return home with the following  A little help with walking and/or transfers;Assistance with cooking/housework;Direct supervision/assist for medications management;Direct supervision/assist for financial management;Assist for transportation;Help with stairs or ramp for entrance;A little help with bathing/dressing/bathroom   Equipment Recommendations  None recommended by OT (Pt reports no DME needs at this time)    Recommendations for Other Services      Precautions / Restrictions Precautions Precautions: Fall;Other (comment) Precaution Comments:  O2 uses 3L O2 via Michele Owens at baseline Restrictions Weight Bearing Restrictions: No       Mobility Bed Mobility Overal bed mobility:  (Pt received sitting on 3:1)    Transfers Overall transfer level: Needs assistance Equipment used: Straight cane Transfers: Sit to/from Stand Sit to Stand: Min assist     General transfer comment: light rise and steadying assist     Balance Overall balance assessment: Needs assistance Sitting-balance support: Feet supported Sitting balance-Leahy Scale: Fair Sitting balance - Comments: Sits unsupported in chair at sink - did not use back rest   Standing balance support: Single extremity supported, Reliant on assistive device for balance Standing balance-Leahy Scale: Fair Standing balance comment: Pt used SPC during functional mobility and standing at sink during ADL's, she is noted to reach for objects in room and lean on sink at times. Discussed use of RW for increased support/safety and pt declines     ADL either performed or assessed with clinical judgement   ADL Overall ADL's : Needs assistance/impaired   Grooming: Wash/dry hands;Wash/dry face;Oral care;Set up;Sitting Grooming Details (indicate cue type and reason): Sitting at sink Upper Body Bathing: Set up;Minimal assistance;Sitting Upper Body Bathing Details (indicate cue type and reason): Min A to wash back only Lower Body Bathing: Set up;Minimal assistance;Sit to/from stand Lower Body Bathing Details (indicate cue type and reason): Pt uses figure 4 crossing legs to bathe, Min A for sit to stand from chair at sink level Upper Body Dressing : Set up;Modified independent;Sitting Upper Body Dressing Details (indicate cue type and reason): Doff and don gown in sitting Lower Body Dressing: Minimal assistance;Sit to/from stand;Sitting/lateral leans Lower Body Dressing Details (indicate cue type and reason): Pt removed and then donned  clean socks sitting in chair using figure 4 technique, min A  for sit to stand from chair at sink level Toilet Transfer: Minimal assistance;BSC/3in1;Ambulation;Cueing for safety [facility]) Toilet Transfer Details (indicate cue type and reason): Pt is Min A for toilet transfer using SPC. Pt noted to reach for counter and chair - discussed RW use and pt prefers SPC. Toileting- Clothing Manipulation and Hygiene: Sit to/from stand;Sitting/lateral lean;Min guard;Minimal assistance Toileting - Clothing Manipulation Details (indicate cue type and reason): Min A for Sit to stand and safety to clean peri area, leaning on sink   Functional mobility during ADLs: Minimal assistance;Cueing for safety General ADL Comments: Pt seen for OT ADL retraining session today with focus on toileting (min A for sit to stand from 3:1 using SPC), grooming (set-up assist seated at sink), bathing (set up UB, Min A LB, pt uses figure 4 technique sitting in chair at sink level), and dressing (set up assist UB, Min A LB seated at sink). Pt is making nice gains with ADL's and functional transfers. She plans to d/c home with PRN HHA support Mon-Sat stating "I do not want to go to Rehab". Continue to recommend Michele Owens for home assessment and recommendations.    Extremity/Trunk Assessment Upper Extremity Assessment Upper Extremity Assessment: Overall WFL for tasks assessed;Generalized weakness   Lower Extremity Assessment Lower Extremity Assessment: Defer to PT evaluation        Vision Baseline Vision/History: 1 Wears glasses Patient Visual Report: No change from baseline Vision Assessment?: No apparent visual deficits          Cognition Arousal/Alertness: Awake/alert Behavior During Therapy: WFL for tasks assessed/performed Overall Cognitive Status: No family/caregiver present to determine baseline cognitive functioning             General Comments 3L O2 via Michele Owens throughout this session    Pertinent Vitals/ Pain       Pain Assessment Pain Assessment: Faces Faces Pain Scale: Hurts  little more Pain Location: left knee Pain Descriptors / Indicators: Discomfort, Grimacing Pain Intervention(s): Limited activity within patient's tolerance, Monitored during session  Home Living Family/patient expects to be discharged to:: Private residence Living Arrangements: Alone Available Help at Discharge: Other (Comment) (None) Type of Home: Apartment Home Access: Level entry   Home Layout: One level   Bathroom Shower/Tub: Teacher, early years/pre: Standard     Home Equipment: Cane - single Location manager (2 wheels);Rollator (4 wheels);Toilet riser   Additional Comments: aide Mon-Sat "most of the day" unable to provide time frame      Prior Functioning/Environment   Lives alone in apartment with HHA assist "most of the day" Mon-Sat. Please refer to initial OT eval for details   Frequency  Min 2X/week        Progress Toward Goals  OT Goals(current goals can now be found in the care plan section)  Progress towards OT goals: Progressing toward goals  Acute Rehab OT Goals Patient Stated Goal: None stated OT Goal Formulation: With patient Time For Goal Achievement: 05/30/22 Potential to Achieve Goals: Good ADL Goals Pt Will Perform Upper Body Dressing: with min guard assist;sitting Pt Will Perform Lower Body Dressing: with min guard assist;sit to/from stand;sitting/lateral leans Pt Will Transfer to Toilet: with min guard assist;ambulating;regular height toilet Pt Will Perform Tub/Shower Transfer: Tub transfer;Shower transfer;with min assist;ambulating;shower seat  Plan Discharge plan remains appropriate       AM-PAC OT "6 Clicks" Daily Activity     Outcome Measure   Help from  another person eating meals?: None Help from another person taking care of personal grooming?: A Little Help from another person toileting, which includes using toliet, bedpan, or urinal?: A Little Help from another person bathing (including washing,  rinsing, drying)?: A Little Help from another person to put on and taking off regular upper body clothing?: A Little Help from another person to put on and taking off regular lower body clothing?: A Little 6 Click Score: 19    End of Session Equipment Utilized During Treatment: Other (comment);Oxygen (3L O2 via [place], straight cane (SPC))  OT Visit Diagnosis: Unsteadiness on feet (R26.81);Other abnormalities of gait and mobility (R26.89);Muscle weakness (generalized) (M62.81)   Activity Tolerance Patient tolerated treatment well   Patient Left in bed;with call bell/phone within reach   Nurse Communication Mobility status;Other (comment) (ADL's completed)        Time: 5732-2025 OT Time Calculation (min): 47 min  Charges: OT General Charges $OT Visit: 1 Visit OT Treatments $Self Care/Home Management : 38-52 mins  Fama Muenchow Beth Dixon, OTR/L 05/17/2022, 10:57 AM

## 2022-05-18 DIAGNOSIS — J810 Acute pulmonary edema: Secondary | ICD-10-CM | POA: Diagnosis not present

## 2022-05-18 LAB — GLUCOSE, CAPILLARY
Glucose-Capillary: 165 mg/dL — ABNORMAL HIGH (ref 70–99)
Glucose-Capillary: 150 mg/dL — ABNORMAL HIGH (ref 70–99)

## 2022-05-18 LAB — BASIC METABOLIC PANEL
Anion gap: 11 (ref 5–15)
BUN: 13 mg/dL (ref 8–23)
CO2: 28 mmol/L (ref 22–32)
Calcium: 9 mg/dL (ref 8.9–10.3)
Chloride: 98 mmol/L (ref 98–111)
Creatinine, Ser: 0.73 mg/dL (ref 0.44–1.00)
GFR, Estimated: >60 mL/min (ref >60)
Glucose, Bld: 105 mg/dL — ABNORMAL HIGH (ref 70–99)
Potassium: 3.6 mmol/L (ref 3.5–5.1)
Sodium: 137 mmol/L (ref 135–145)

## 2022-05-18 LAB — TROPONIN I (HIGH SENSITIVITY)
Troponin I (High Sensitivity): 5 ng/L (ref <18)
Troponin I (High Sensitivity): 5 ng/L (ref <18)

## 2022-05-18 LAB — MAGNESIUM: Magnesium: 2 mg/dL (ref 1.7–2.4)

## 2022-05-18 MED ORDER — LISINOPRIL 5 MG PO TABS: 5.0000 mg | ORAL_TABLET | Freq: Every day | ORAL | Status: DC

## 2022-05-18 MED ORDER — CLONAZEPAM 0.5 MG PO TABS
0.5000 mg | ORAL_TABLET | Freq: Once | ORAL | Status: AC
  Administered 2022-05-18: 0.5 mg via ORAL
  Filled 2022-05-18: qty 1

## 2022-05-18 MED ORDER — CARVEDILOL 3.125 MG PO TABS: 3.1250 mg | ORAL_TABLET | Freq: Two times a day (BID) | ORAL | 0 refills | Status: DC

## 2022-05-18 MED ORDER — NITROGLYCERIN 0.4 MG SL SUBL
0.4000 mg | SUBLINGUAL_TABLET | SUBLINGUAL | Status: DC | PRN
  Administered 2022-05-18 (×2): 0.4 mg via SUBLINGUAL
  Filled 2022-05-18: qty 1

## 2022-05-18 MED ORDER — TORSEMIDE 20 MG PO TABS: 20.0000 mg | ORAL_TABLET | Freq: Two times a day (BID) | ORAL | 0 refills | Status: DC

## 2022-05-18 MED ORDER — CLONAZEPAM 0.5 MG PO TABS: 2.0000 mg | ORAL_TABLET | Freq: Once | ORAL | Status: DC

## 2022-05-18 NOTE — Progress Notes (Signed)
Pt waiting for daughter who will be coming around 1500 to pick her up

## 2022-05-18 NOTE — Progress Notes (Signed)
Patient was given her discharge instructions.  She stated understanding and didn't have any questions.  Patient is on O2 so she can not go to the lounge and her daughter should be here at 3 PM

## 2022-05-18 NOTE — Progress Notes (Signed)
Pt dropped 1 tab of her klonopin (0.5).... called pharmacy and was given a one time order for 0.5 mg of klonopin.

## 2022-05-18 NOTE — Plan of Care (Signed)
  Problem: Education: Goal: Knowledge of General Education information will improve Description: Including pain rating scale, medication(s)/side effects and non-pharmacologic comfort measures Outcome: Progressing   Problem: Health Behavior/Discharge Planning: Goal: Ability to manage health-related needs will improve Outcome: Progressing   Problem: Clinical Measurements: Goal: Respiratory complications will improve Outcome: Progressing   Problem: Clinical Measurements: Goal: Cardiovascular complication will be avoided Outcome: Progressing   Problem: Activity: Goal: Risk for activity intolerance will decrease Outcome: Progressing   Problem: Coping: Goal: Level of anxiety will decrease Outcome: Progressing   Problem: Pain Managment: Goal: General experience of comfort will improve Outcome: Progressing   Problem: Safety: Goal: Ability to remain free from injury will improve Outcome: Progressing   Problem: Skin Integrity: Goal: Risk for impaired skin integrity will decrease Outcome: Progressing

## 2022-05-18 NOTE — Plan of Care (Signed)
  Problem: Education: Goal: Ability to describe self-care measures that may prevent or decrease complications (Diabetes Survival Skills Education) will improve Outcome: Adequate for Discharge Goal: Individualized Educational Video(s) Outcome: Adequate for Discharge   Problem: Coping: Goal: Ability to adjust to condition or change in health will improve Outcome: Adequate for Discharge   Problem: Fluid Volume: Goal: Ability to maintain a balanced intake and output will improve Outcome: Adequate for Discharge   Problem: Health Behavior/Discharge Planning: Goal: Ability to identify and utilize available resources and services will improve Outcome: Adequate for Discharge Goal: Ability to manage health-related needs will improve Outcome: Adequate for Discharge   Problem: Metabolic: Goal: Ability to maintain appropriate glucose levels will improve Outcome: Adequate for Discharge   Problem: Nutritional: Goal: Maintenance of adequate nutrition will improve Outcome: Adequate for Discharge Goal: Progress toward achieving an optimal weight will improve Outcome: Adequate for Discharge   Problem: Skin Integrity: Goal: Risk for impaired skin integrity will decrease Outcome: Adequate for Discharge   Problem: Tissue Perfusion: Goal: Adequacy of tissue perfusion will improve Outcome: Adequate for Discharge   Problem: Education: Goal: Knowledge of General Education information will improve Description: Including pain rating scale, medication(s)/side effects and non-pharmacologic comfort measures Outcome: Adequate for Discharge   Problem: Health Behavior/Discharge Planning: Goal: Ability to manage health-related needs will improve Outcome: Adequate for Discharge   Problem: Clinical Measurements: Goal: Ability to maintain clinical measurements within normal limits will improve Outcome: Adequate for Discharge Goal: Will remain free from infection Outcome: Adequate for Discharge Goal:  Diagnostic test results will improve Outcome: Adequate for Discharge Goal: Respiratory complications will improve Outcome: Adequate for Discharge Goal: Cardiovascular complication will be avoided Outcome: Adequate for Discharge   Problem: Activity: Goal: Risk for activity intolerance will decrease Outcome: Adequate for Discharge   Problem: Nutrition: Goal: Adequate nutrition will be maintained Outcome: Adequate for Discharge   Problem: Coping: Goal: Level of anxiety will decrease Outcome: Adequate for Discharge   Problem: Elimination: Goal: Will not experience complications related to bowel motility Outcome: Adequate for Discharge Goal: Will not experience complications related to urinary retention Outcome: Adequate for Discharge   Problem: Pain Managment: Goal: General experience of comfort will improve Outcome: Adequate for Discharge   Problem: Safety: Goal: Ability to remain free from injury will improve Outcome: Adequate for Discharge   Problem: Skin Integrity: Goal: Risk for impaired skin integrity will decrease Outcome: Adequate for Discharge   Problem: Acute Rehab PT Goals(only PT should resolve) Goal: Pt Will Go Supine/Side To Sit Outcome: Adequate for Discharge Goal: Patient Will Transfer Sit To/From Stand Outcome: Adequate for Discharge Goal: Pt Will Transfer Bed To Chair/Chair To Bed Outcome: Adequate for Discharge Goal: Pt Will Ambulate Outcome: Adequate for Discharge Goal: Pt/caregiver will Perform Home Exercise Program Outcome: Adequate for Discharge   Problem: Acute Rehab OT Goals (only OT should resolve) Goal: Pt. Will Perform Upper Body Dressing Outcome: Adequate for Discharge Goal: Pt. Will Perform Lower Body Dressing Outcome: Adequate for Discharge Goal: Pt. Will Transfer To Toilet Outcome: Adequate for Discharge Goal: Pt. Will Perform Tub/Shower Transfer Outcome: Adequate for Discharge

## 2022-05-18 NOTE — TOC Initial Note (Signed)
Transition of Care [facility]) - Initial/Assessment Note    Patient Details  Name: Michele Owens MRN: [id number] Date of Birth: 1961-03-23  [facility] of [facility] CM/SW Contact:    Bethena Roys, RN Phone Number: 05/18/2022, 11:37 AM  Clinical Narrative: Risk for readmission assessment completed. PTA patient was from home. Has DME cane at the bedside. Patient reports she has support from her daughter Michele Owens. Case Manager discussed Medicare.gov list for home health agency with patient and daughter- both agreeable to [facility] PT services with [facility]. Referral made and start of care to begin within 24-48 hours post transition home. Patient reports she has transportation home. No further needs identified at this time.                  Expected Discharge Plan: [place] Barriers to Discharge: No Barriers Identified   Patient Goals and CMS Choice Patient states their goals for this hospitalization and ongoing recovery are:: to return [place]. CMS Medicare.gov Compare Post Acute Care list provided to:: Patient Choice offered to / list presented to : Patient, Adult Children      Expected Discharge Plan and Services In-house Referral: NA Discharge Planning Services: CM Consult Post Acute Care Choice: Home Health Living arrangements for the past 2 months: Single Family Home Expected Discharge Date: 05/18/22                 DME Agency: NA       HH Arranged: PT HH Agency: [facility] Date [place]: 05/18/22 Time [place]: 1137 Representative spoke with at [place]: Claiborne Billings  Prior Living Arrangements/Services Living arrangements for the past 2 months: [place] with:: Self Patient language and need for interpreter reviewed:: Yes Do you feel safe going back to the place where you live?: Yes      Need for Family Participation in Patient Care: Yes (Comment) Care giver support system in place?: Yes  (comment)   Criminal Activity/Legal Involvement Pertinent to Current Situation/Hospitalization: No - Comment as needed  Activities of Daily Living Home Assistive Devices/Equipment: Cane (specify quad or straight), Dentures (specify type), Bedside commode/3-in-1, Reacher, Oxygen, CBG Meter ADL Screening (condition at time of admission) Patient's cognitive ability adequate to safely complete daily activities?: Yes Is the patient deaf or have difficulty hearing?: No Does the patient have difficulty seeing, even when wearing glasses/contacts?: Yes Does the patient have difficulty concentrating, remembering, or making decisions?: Yes Patient able to express need for assistance with ADLs?: Yes Does the patient have difficulty dressing or bathing?: Yes Independently performs ADLs?: No Communication: Independent Dressing (OT): Needs assistance Is this a change from baseline?: Pre-admission baseline Grooming: Needs assistance Is this a change from baseline?: Pre-admission baseline Feeding: Independent Bathing: Needs assistance Is this a change from baseline?: Pre-admission baseline Toileting: Needs assistance (needs cane) Is this a change from baseline?: Pre-admission baseline In/Out Bed: Independent Walks in Home: Needs assistance (use a cane) Is this a change from baseline?: Pre-admission baseline Does the patient have difficulty walking or climbing stairs?: Yes Weakness of Legs: Both Weakness of Arms/Hands: None  Permission Sought/Granted Permission sought to share information with : Case Manager, Customer service manager, Family Supports Permission granted to share information with : Yes, Verbal Permission Granted     Permission granted to share info w AGENCY: [place]        Emotional Assessment Appearance:: Appears stated age Attitude/Demeanor/Rapport: Unable to Assess Affect (typically observed): Unable to  Assess   Alcohol / Substance Use: Not  Applicable Psych Involvement: No (comment)  Admission diagnosis:  Acute pulmonary edema (HCC) [J81.0] Acute congestive heart failure, unspecified heart failure type ([place]) [I50.9] Acute heart failure with preserved ejection fraction (HFpEF) ([place]) [I50.31] Patient Active Problem List   Diagnosis Date Noted   Acute heart failure with preserved ejection fraction (HFpEF) ([place]) 05/16/2022   Acute renal failure (ARF) ([place]) 02/01/2022   Hypothyroidism 02/01/2022   Chronic pain disorder 02/01/2022   Respiratory failure with hypercapnia ([place]) 12/23/2020   Anemia 12/23/2020   Acute on chronic respiratory failure with hypoxia and hypercapnia ([place]) 04/18/2020   Acute on chronic respiratory failure with hypoxia ([place]) 04/18/2020   Hemoptysis 19/62/2297   Acute metabolic encephalopathy 98/92/1194   Acute encephalopathy 04/18/2020   Chronic diastolic CHF (congestive heart failure) ([place]) 04/18/2020   Sepsis ([place]) 04/18/2020   Obesity, Class III, BMI 40-49.9 (morbid obesity) ([place])    Fall at home, initial encounter 06/23/2018   HNP (herniated nucleus pulposus), thoracic 06/23/2018   CHF (congestive heart failure) ([place]) 06/18/2018   Hypertensive disorder 08/28/2017   Obesity 08/28/2017   COPD exacerbation ([place]) 08/04/2017   Acute and chronic respiratory failure with hypercapnia ([place])    Pulmonary HTN ([place])    Genetic testing 02/27/2017   Family history of thyroid cancer    Hypomagnesemia 10/03/2016   Atypical chest pain    HLD (hyperlipidemia) 09/23/2016   Gout 09/23/2016   DVT (deep vein thrombosis) in pregnancy 09/23/2016   Thyroid cancer ([place]) 09/23/2016   Hypocalcemia 09/23/2016   AKI (acute kidney injury) ([place]) 09/23/2016   Acute pulmonary edema (HCC)    Acute hypoxemic respiratory failure (HCC)    Chronic respiratory failure with hypoxia and hypercapnia ([place]) 07/16/2016   CAP (community acquired pneumonia) 01/23/2016   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 01/23/2016    Pulmonary hypertension ([place]) 01/23/2016   Obesity hypoventilation syndrome ([place]) 08/26/2015   Dyslipidemia associated with type 2 diabetes mellitus ([place]) 04/24/2015   Controlled type 2 diabetes mellitus without complication, without long-term current use of insulin ([place]) 04/24/2015   Anxiety and depression 04/24/2015   Benign essential HTN 04/24/2015   Normocytic anemia 04/24/2015   OSA (obstructive sleep apnea) 05/10/2012   Tobacco abuse 07/04/2011   PCP:  [name], MD Pharmacy:   [place], [place] - [address] [place] [address] Phone: [phone] Fax: [place], [place] - [address] [place] [address] Phone: [phone] Fax: [phone]     Social Determinants of Health (SDOH) Social History: SDOH Screenings   Food Insecurity: No Food Insecurity (05/15/2022)  Housing: Low Risk  (05/15/2022)  Transportation Needs: No Transportation Needs (05/15/2022)  Utilities: At Risk (05/15/2022)  Alcohol Screen: Low Risk  (01/26/2021)  Depression (PHQ2-9): Low Risk  (03/21/2021)  Financial Resource Strain: Low Risk  (02/01/2021)  Physical Activity: Inactive (02/01/2021)  Social Connections: Moderately Isolated (01/27/2021)  Stress: Stress Concern Present (02/23/2021)  Tobacco Use: Medium Risk (05/15/2022)   SDOH Interventions: Utilities Interventions: Other (Comment) (resources provided patient accepted)   Readmission Risk Interventions    05/18/2022   11:29 AM  Readmission Risk Prevention Plan  Transportation Screening Complete  HRI or [place] Complete  Social Work Consult for [place] Planning/Counseling Complete  Palliative Care Screening Complete  Medication Review Press photographer) Referral to Pharmacy

## 2022-05-18 NOTE — Progress Notes (Signed)
Pt is upset and wants to leave ... daughter is on the phone agreeing with pt stating that the hospital is a business and that [place] treats black people different.... Education given but daughter and patient took it as I'm treating them as if they are dumb.... Will notify MD.

## 2022-05-18 NOTE — Discharge Summary (Signed)
Physician Discharge Summary   Patient: Michele Owens MRN: [id number] DOB: 1960-05-21  Admit date:     05/14/2022  Discharge date: 05/18/22  Discharge Physician: Lucienne Minks    PCP: [name], MD   Recommendations at discharge:    Please take the torsemide as prescribed and please note the change in dosing for the coreg   Discharge Diagnoses: Principal Problem:   Acute pulmonary edema ([place]) Active Problems:   Controlled type 2 diabetes mellitus without complication, without long-term current use of insulin ([place])   Benign essential HTN   Dyslipidemia associated with type 2 diabetes mellitus ([place])   Anxiety and depression   Normocytic anemia   Chronic pain disorder   Acute heart failure with preserved ejection fraction (HFpEF) ([place])  Resolved Problems:   * No resolved hospital problems. *  Hospital Course: 62 yo F treated for acute HFpEF exacerbation w/ acute pulmonary edema. Troponin's negative x 2 on 05/15/2022 and 05/18/2022.  Pt treated with IV lasix 40 mg daily.  Daily weight and Strict I and O were monitored. ECHO on this admission showed LVEF 55-60%, no WMA. Grade I diastolic dysfunction.  HTN was managed with lisinopril 5 mg PO daily and coreg 3.125 mg PO bid (coreg was decreased from 6.25 mg PO bid to 3.125 mg PO bid and the pt tolerated it well). PT/OT worked with the pt on this admission. Pt was initially reluctant but eventually did agree to participate. Spoke with the pt's daughter regarding the pt's willingness to participate with PT/OT on the morning of 05/18/2022. Pt's daughter advised that the pt had a lot of things on her mind.  PT advised home health PT. Pt does have her oxygen for home use at the bedside.   Pt and daughter advised no smoking or open flames around the oxygen tanks as they can explode and catch fire.  Assessment and Plan:     Procedures performed: ECHO 05/15/2022 Disposition: Home Diet recommendation:  Cardiac and Carb modified diet DISCHARGE  MEDICATION: Allergies as of 05/18/2022       Reactions   Bee Venom Swelling, Other (See Comments)   "All over my body" (swelling)   Fioricet [butalbital-apap-caffeine] Nausea And Vomiting, Rash   Ibuprofen Rash, Other (See Comments)   Severe rash   Lamisil [terbinafine] Rash, Other (See Comments)   Pt states this causes her to "feel funny"   Nsaids Other (See Comments)   Per MD's orders         Medication List     STOP taking these medications    metFORMIN 500 MG tablet Commonly known as: Glucophage       TAKE these medications    acetaminophen 500 MG tablet Commonly known as: TYLENOL Take 1,000 mg by mouth every 6 (six) hours as needed for moderate pain or headache.   albuterol 108 (90 Base) MCG/ACT inhaler Commonly known as: VENTOLIN HFA Inhale 1-2 puffs into the lungs every 6 (six) hours as needed for wheezing or shortness of breath.   allopurinol 100 MG tablet Commonly known as: ZYLOPRIM Take 100 mg by mouth daily.   Anoro Ellipta 62.5-25 MCG/ACT Aepb Generic drug: umeclidinium-vilanterol Inhale 1 puff into the lungs daily.   aspirin EC 81 MG tablet Take 81 mg by mouth daily. Swallow whole.   blood glucose meter kit and supplies Kit Dispense based on patient and insurance preference. Use up to four times daily as directed.   busPIRone 5 MG tablet Commonly known as: BUSPAR Take 5  mg by mouth 3 (three) times daily.   calcitRIOL 0.5 MCG capsule Commonly known as: ROCALTROL Take 1 capsule (0.5 mcg total) by mouth daily.   carvedilol 3.125 MG tablet Commonly known as: COREG Take 1 tablet (3.125 mg total) by mouth 2 (two) times daily with a meal. What changed:  medication strength how much to take   clonazePAM 2 MG tablet Commonly known as: KLONOPIN Take 1 tablet (2 mg total) by mouth 3 (three) times daily. for anxiety What changed: additional instructions   cyclobenzaprine 10 MG tablet Commonly known as: FLEXERIL Take 10 mg by mouth 3 (three)  times daily as needed for muscle spasms.   dicyclomine 20 MG tablet Commonly known as: BENTYL Take 1 tablet (20 mg total) by mouth 2 (two) times daily.   EPINEPHrine 0.3 mg/0.3 mL Soaj injection Commonly known as: EPI-PEN Inject 0.3 mg into the muscle once as needed for anaphylaxis.   fenofibrate 145 MG tablet Commonly known as: TRICOR Take 145 mg by mouth daily.   FeroSul 325 (65 FE) MG tablet Generic drug: ferrous sulfate Take 325 mg by mouth daily.   ipratropium-albuterol 0.5-2.5 (3) MG/3ML Soln Commonly known as: DUONEB Take 3 mLs by nebulization every 6 (six) hours as needed (for shortness of breath).   levothyroxine 112 MCG tablet Commonly known as: SYNTHROID Take 224 mcg by mouth daily before breakfast.   lisinopril 5 MG tablet Commonly known as: ZESTRIL Take 1 tablet (5 mg total) by mouth daily. Please hold this medication for three days, please check your blood pressure at home, bring in record for your pcp to review What changed: additional instructions   loperamide 2 MG capsule Commonly known as: IMODIUM Take 1 capsule (2 mg total) by mouth 4 (four) times daily as needed for diarrhea or loose stools.   loratadine 10 MG tablet Commonly known as: CLARITIN Take 10 mg by mouth daily as needed for allergies.   nitroGLYCERIN 0.4 MG SL tablet Commonly known as: NITROSTAT Place 1 tablet (0.4 mg total) under the tongue every 5 (five) minutes as needed for chest pain.   nystatin cream Commonly known as: MYCOSTATIN Apply 1 Application topically 2 (two) times daily as needed for dry skin.   omeprazole 20 MG capsule Commonly known as: PRILOSEC Take 20 mg by mouth 2 (two) times daily as needed (acid reflux).   ondansetron 4 MG tablet Commonly known as: ZOFRAN Take 4 mg by mouth every 8 (eight) hours as needed for nausea or vomiting.   oxyCODONE-acetaminophen 10-325 MG tablet Commonly known as: PERCOCET Take 1 tablet by mouth every 6 (six) hours as needed for  pain.   OXYGEN Inhale 3 L into the lungs continuous.   phentermine 37.5 MG tablet Commonly known as: ADIPEX-P Take 37.5 mg by mouth daily.   polyethylene glycol 17 g packet Commonly known as: MIRALAX / GLYCOLAX Take 17 g by mouth daily as needed for mild constipation.   potassium chloride SA 20 MEQ tablet Commonly known as: KLOR-CON M Take 1 tablet (20 mEq total) by mouth 2 (two) times daily.   rosuvastatin 10 MG tablet Commonly known as: CRESTOR Take 10 mg by mouth every evening.   senna-docusate 8.6-50 MG tablet Commonly known as: Senokot-S Take 1 tablet by mouth daily.   torsemide 20 MG tablet Commonly known as: DEMADEX Take 1 tablet (20 mg total) by mouth 2 (two) times daily. What changed: See the new instructions.   traZODone 50 MG tablet Commonly known as: DESYREL Take 100  mg by mouth at bedtime.   triamcinolone cream 0.1 % Commonly known as: KENALOG Apply 1 application. topically 2 (two) times daily as needed (irrritation).   valACYclovir 1000 MG tablet Commonly known as: VALTREX Take 1,000 mg by mouth daily.        Discharge Exam: Filed Weights   05/15/22 0523 05/16/22 0420 05/17/22 0354  Weight: 127.1 kg 125.1 kg 122.2 kg    Condition at discharge: fair  The results of significant diagnostics from this hospitalization (including imaging, microbiology, ancillary and laboratory) are listed below for reference.   Imaging Studies: ECHOCARDIOGRAM COMPLETE  Result Date: 05/15/2022    ECHOCARDIOGRAM REPORT   Patient Name:   DREAMA KUNA Date of Exam: 05/15/2022 Medical Rec #:  [id number]     Height:       65.0 in Accession #:    [id number]    Weight:       280.3 lb Date of Birth:  [birth year]    BSA:          2.283 m Patient Age:    62 years      BP:           117/69 mmHg Patient Gender: F             HR:           83 bpm. Exam Location:  Inpatient Procedure: 2D Echo, Cardiac Doppler, Color Doppler and Intracardiac            Opacification Agent Indications:     C14.48 Acute diastolic (congestive) heart failure  History:        Patient has prior history of Echocardiogram examinations. CHF,                 COPD; Risk Factors:Diabetes and Hypertension.  Sonographer:    Phineas Douglas Referring Phys: [place]  1. Left ventricular ejection fraction, by estimation, is 55 to 60%. The left ventricle has normal function. The left ventricle has no regional wall motion abnormalities. There is mild concentric left ventricular hypertrophy. Left ventricular diastolic parameters are consistent with Grade I diastolic dysfunction (impaired relaxation).  2. Very mildly D-shaped interventricular septum suggestive of RV pressure/volume overload. Right ventricular systolic function is normal. The right ventricular size is normal.  3. Right atrial size was mildly dilated.  4. The mitral valve is normal in structure. Trivial mitral valve regurgitation. No evidence of mitral stenosis.  5. The aortic valve is tricuspid. Aortic valve regurgitation is not visualized. No aortic stenosis is present.  6. IVC not visualized. Peak RV-RA gradient 17 mmHg.  7. Technically difficult study with poor acoustic windows. FINDINGS  Left Ventricle: Left ventricular ejection fraction, by estimation, is 55 to 60%. The left ventricle has normal function. The left ventricle has no regional wall motion abnormalities. Definity contrast agent was given IV to delineate the left ventricular  endocardial borders. The left ventricular internal cavity size was normal in size. There is mild concentric left ventricular hypertrophy. Left ventricular diastolic parameters are consistent with Grade I diastolic dysfunction (impaired relaxation). Right Ventricle: Very mildly D-shaped interventricular septum suggestive of RV pressure/volume overload. The right ventricular size is normal. No increase in right ventricular wall thickness. Right ventricular systolic function is normal. Left Atrium: Left atrial  size was normal in size. Right Atrium: Right atrial size was mildly dilated. Pericardium: There is no evidence of pericardial effusion. Mitral Valve: The mitral valve is normal in structure. There is mild calcification of the  mitral valve leaflet(s). Mild mitral annular calcification. Trivial mitral valve regurgitation. No evidence of mitral valve stenosis. Tricuspid Valve: The tricuspid valve is normal in structure. Tricuspid valve regurgitation is trivial. Aortic Valve: The aortic valve is tricuspid. Aortic valve regurgitation is not visualized. No aortic stenosis is present. Pulmonic Valve: The pulmonic valve was normal in structure. Pulmonic valve regurgitation is not visualized. Aorta: The aortic root is normal in size and structure. Venous: The inferior vena cava was not well visualized. IAS/Shunts: No atrial level shunt detected by color flow Doppler.  LEFT VENTRICLE PLAX 2D LVIDd:         4.00 cm      Diastology LVIDs:         2.90 cm      LV e' medial:    10.10 cm/s LV PW:         1.20 cm      LV E/e' medial:  10.1 LV IVS:        1.20 cm      LV e' lateral:   7.71 cm/s LVOT diam:     2.00 cm      LV E/e' lateral: 13.2 LV SV:         65 LV SV Index:   29 LVOT Area:     3.14 cm  LV Volumes (MOD) LV vol d, MOD A2C: 196.0 ml LV vol d, MOD A4C: 192.0 ml LV vol s, MOD A2C: 74.7 ml LV vol s, MOD A4C: 76.5 ml LV SV MOD A2C:     121.3 ml LV SV MOD A4C:     192.0 ml LV SV MOD BP:      121.1 ml RIGHT VENTRICLE RV Basal diam:  4.30 cm RV S prime:     16.30 cm/s TAPSE (M-mode): 2.8 cm LEFT ATRIUM             Index        RIGHT ATRIUM           Index LA diam:        3.80 cm 1.66 cm/m   RA Area:     21.10 cm LA Vol (A2C):   60.7 ml 26.58 ml/m  RA Volume:   52.80 ml  23.12 ml/m LA Vol (A4C):   57.2 ml 25.05 ml/m LA Biplane Vol: 59.8 ml 26.19 ml/m  AORTIC VALVE LVOT Vmax:   97.00 cm/s LVOT Vmean:  63.300 cm/s LVOT VTI:    0.208 m  AORTA Ao Root diam: 3.40 cm Ao Asc diam:  3.50 cm MITRAL VALVE                TRICUSPID  VALVE MV Area (PHT): 3.07 cm     TR Peak grad:   17.0 mmHg MV Decel Time: 247 msec     TR Vmax:        206.00 cm/s MV E velocity: 102.00 cm/s MV A velocity: 96.90 cm/s   SHUNTS MV E/A ratio:  1.05         Systemic VTI:  0.21 m                             Systemic Diam: 2.00 cm Dalton McleanMD Electronically signed by Franki Monte Signature Date/Time: 05/15/2022/1:27:30 PM    Final    DG Chest Portable 1 View  Result Date: 05/14/2022 CLINICAL DATA:  Cough, weakness, leg swelling. EXAM: PORTABLE CHEST 1 VIEW COMPARISON:  01/31/2022. FINDINGS: Heart  is enlarged and the mediastinal contour stable. There is atherosclerotic calcification of the aorta. The pulmonary vasculature is distended. Patchy airspace disease is present at the lung bases bilaterally. No effusion or pneumothorax. Surgical clips are noted in the cervical soft tissues. No acute osseous abnormality. IMPRESSION: 1. Cardiomegaly with pulmonary vascular congestion. 2. Patchy airspace disease at the lung bases, possible edema or infiltrate. Electronically Signed   By: Brett Fairy M.D.   On: 05/14/2022 23:37    Microbiology: Results for orders placed or performed during the hospital encounter of 05/14/22  Resp panel by RT-PCR (RSV, Flu A&B, Covid) Anterior Nasal Swab     Status: None   Collection Time: 05/15/22 12:17 AM   Specimen: Anterior Nasal Swab  Result Value Ref Range Status   SARS Coronavirus 2 by RT PCR NEGATIVE NEGATIVE Final   Influenza A by PCR NEGATIVE NEGATIVE Final   Influenza B by PCR NEGATIVE NEGATIVE Final    Comment: (NOTE) The Xpert Xpress SARS-CoV-2/FLU/RSV plus assay is intended as an aid in the diagnosis of influenza from Nasopharyngeal swab specimens and should not be used as a sole basis for treatment. Nasal washings and aspirates are unacceptable for Xpert Xpress SARS-CoV-2/FLU/RSV testing.  Fact Sheet for Patients: EntrepreneurPulse.com.au  Fact Sheet for Healthcare  Providers: IncredibleEmployment.be  This test is not yet approved or cleared by the [place] FDA and has been authorized for detection and/or diagnosis of SARS-CoV-2 by FDA under an Emergency Use Authorization (EUA). This EUA will remain in effect (meaning this test can be used) for the duration of the COVID-19 declaration under Section 564(b)(1) of the Act, 21 U.S.C. section 360bbb-3(b)(1), unless the authorization is terminated or revoked.     Resp Syncytial Virus by PCR NEGATIVE NEGATIVE Final    Comment: (NOTE) Fact Sheet for Patients: EntrepreneurPulse.com.au  Fact Sheet for Healthcare Providers: IncredibleEmployment.be  This test is not yet approved or cleared by the [place] FDA and has been authorized for detection and/or diagnosis of SARS-CoV-2 by FDA under an Emergency Use Authorization (EUA). This EUA will remain in effect (meaning this test can be used) for the duration of the COVID-19 declaration under Section 564(b)(1) of the Act, 21 U.S.C. section 360bbb-3(b)(1), unless the authorization is terminated or revoked.  Performed at [facility], [place] [address] [place], [place] [address]     Labs: CBC: Recent Labs  Lab 05/15/22 0040 05/16/22 0227 05/17/22 0241  WBC 12.4* 11.1* 11.9*  NEUTROABS 8.9*  --   --   HGB 10.2* 11.0* 11.5*  HCT 32.7* 35.8* 37.0  MCV 98.5 98.6 96.9  PLT 263 275 604   Basic Metabolic Panel: Recent Labs  Lab 05/15/22 0040 05/16/22 0227 05/17/22 0241 05/18/22 0133  NA 136 137 137 137  K 3.8 3.7 3.7 3.6  CL 97* 99 99 98  CO2 '31 31 30 28  '$ GLUCOSE 90 102* 101* 105*  BUN 11 9 7* 13  CREATININE 0.81 0.70 0.70 0.73  CALCIUM 8.5* 8.5* 8.8* 9.0  MG  --  1.9 2.0 2.0  PHOS  --  3.6  --   --    Liver Function Tests: No results for input(s): "AST", "ALT", "ALKPHOS", "BILITOT", "PROT", "ALBUMIN" in the last 168 hours. CBG: Recent Labs  Lab 05/17/22 0817  05/17/22 1307 05/17/22 1553 05/17/22 2056 05/18/22 0931  GLUCAP 159* 123* 157* 132* 165*    Discharge time spent: greater than 30 minutes.  Signed: Lucienne Minks , MD [facility] 05/18/2022

## 2022-05-18 NOTE — Progress Notes (Addendum)
Chest pain 10/10, pressure like quality, not radiating anywhere, no diaphoresis, wit SOB. EKG done. Notified D. Crosley, MD.  [phone], chest pain resolved after 2 doses of Nitro SL. No SOB.  Plan of care ongoing.    05/18/22 0100  Vitals  Temp 97.8 F (36.6 C)  Temp Source Oral  BP 117/75  MAP (mmHg) 87  BP Location Left Arm  BP Method Automatic  Patient Position (if appropriate) Lying  Pulse Rate 85  Pulse Rate Source Monitor  ECG Heart Rate 85  Resp 20  MEWS COLOR  MEWS Score Color Green  Oxygen Therapy  SpO2 96 %  O2 Device Nasal Cannula  O2 Flow Rate (L/min) 3 L/min  MEWS Score  MEWS Temp 0  MEWS Systolic 0  MEWS Pulse 0  MEWS RR 0  MEWS LOC 0  MEWS Score 0

## 2022-05-18 NOTE — Progress Notes (Addendum)
Pt and daughter are refusing insulin.... 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 education given and pt decided to take it.

## 2022-05-18 NOTE — Progress Notes (Addendum)
Pt refused lovenox inj... pt education given and verb understanding.

## 2022-05-19 DIAGNOSIS — I110 Hypertensive heart disease with heart failure: Secondary | ICD-10-CM | POA: Diagnosis not present

## 2022-05-22 DIAGNOSIS — I110 Hypertensive heart disease with heart failure: Secondary | ICD-10-CM | POA: Diagnosis not present

## 2022-05-23 DIAGNOSIS — I110 Hypertensive heart disease with heart failure: Secondary | ICD-10-CM | POA: Diagnosis not present

## 2022-05-24 DIAGNOSIS — I110 Hypertensive heart disease with heart failure: Secondary | ICD-10-CM | POA: Diagnosis not present

## 2022-05-25 DIAGNOSIS — I110 Hypertensive heart disease with heart failure: Secondary | ICD-10-CM | POA: Diagnosis not present

## 2022-05-26 DIAGNOSIS — I110 Hypertensive heart disease with heart failure: Secondary | ICD-10-CM | POA: Diagnosis not present

## 2022-05-29 DIAGNOSIS — I110 Hypertensive heart disease with heart failure: Secondary | ICD-10-CM | POA: Diagnosis not present

## 2022-06-01 DIAGNOSIS — I110 Hypertensive heart disease with heart failure: Secondary | ICD-10-CM | POA: Diagnosis not present

## 2022-06-02 DIAGNOSIS — I110 Hypertensive heart disease with heart failure: Secondary | ICD-10-CM | POA: Diagnosis not present

## 2022-06-04 ENCOUNTER — Encounter (HOSPITAL_COMMUNITY): Payer: Self-pay

## 2022-06-04 ENCOUNTER — Emergency Department (HOSPITAL_COMMUNITY): Payer: Medicaid Other

## 2022-06-04 ENCOUNTER — Inpatient Hospital Stay (HOSPITAL_COMMUNITY)
Admission: EM | Admit: 2022-06-04 | Discharge: 2022-06-07 | DRG: 291 | Disposition: A | Discharge status: Home / Self Care | Payer: Medicaid Other | Attending: Internal Medicine | Admitting: Internal Medicine

## 2022-06-04 DIAGNOSIS — J9621 Acute and chronic respiratory failure with hypoxia: Secondary | ICD-10-CM | POA: Diagnosis not present

## 2022-06-04 DIAGNOSIS — G8929 Other chronic pain: Secondary | ICD-10-CM | POA: Diagnosis present

## 2022-06-04 DIAGNOSIS — J4489 Other specified chronic obstructive pulmonary disease: Secondary | ICD-10-CM | POA: Diagnosis present

## 2022-06-04 DIAGNOSIS — R0902 Hypoxemia: Secondary | ICD-10-CM | POA: Diagnosis not present

## 2022-06-04 DIAGNOSIS — I5033 Acute on chronic diastolic (congestive) heart failure: Secondary | ICD-10-CM | POA: Diagnosis not present

## 2022-06-04 DIAGNOSIS — Z808 Family history of malignant neoplasm of other organs or systems: Secondary | ICD-10-CM

## 2022-06-04 DIAGNOSIS — Z87891 Personal history of nicotine dependence: Secondary | ICD-10-CM

## 2022-06-04 DIAGNOSIS — Z1152 Encounter for screening for COVID-19: Secondary | ICD-10-CM

## 2022-06-04 DIAGNOSIS — R069 Unspecified abnormalities of breathing: Secondary | ICD-10-CM | POA: Diagnosis not present

## 2022-06-04 DIAGNOSIS — A419 Sepsis, unspecified organism: Secondary | ICD-10-CM

## 2022-06-04 DIAGNOSIS — L0292 Furuncle, unspecified: Secondary | ICD-10-CM | POA: Diagnosis present

## 2022-06-04 DIAGNOSIS — R5381 Other malaise: Secondary | ICD-10-CM | POA: Diagnosis present

## 2022-06-04 DIAGNOSIS — Z9981 Dependence on supplemental oxygen: Secondary | ICD-10-CM | POA: Diagnosis not present

## 2022-06-04 DIAGNOSIS — N762 Acute vulvitis: Secondary | ICD-10-CM | POA: Diagnosis present

## 2022-06-04 DIAGNOSIS — F319 Bipolar disorder, unspecified: Secondary | ICD-10-CM | POA: Diagnosis not present

## 2022-06-04 DIAGNOSIS — R509 Fever, unspecified: Secondary | ICD-10-CM | POA: Diagnosis not present

## 2022-06-04 DIAGNOSIS — Z86718 Personal history of other venous thrombosis and embolism: Secondary | ICD-10-CM

## 2022-06-04 DIAGNOSIS — F22 Delusional disorders: Secondary | ICD-10-CM | POA: Diagnosis not present

## 2022-06-04 DIAGNOSIS — R000 Tachycardia, unspecified: Secondary | ICD-10-CM | POA: Diagnosis present

## 2022-06-04 DIAGNOSIS — Z91030 Bee allergy status: Secondary | ICD-10-CM

## 2022-06-04 DIAGNOSIS — Z743 Need for continuous supervision: Secondary | ICD-10-CM | POA: Diagnosis not present

## 2022-06-04 DIAGNOSIS — E785 Hyperlipidemia, unspecified: Secondary | ICD-10-CM | POA: Diagnosis present

## 2022-06-04 DIAGNOSIS — R079 Chest pain, unspecified: Secondary | ICD-10-CM | POA: Diagnosis not present

## 2022-06-04 DIAGNOSIS — Z90710 Acquired absence of both cervix and uterus: Secondary | ICD-10-CM | POA: Diagnosis not present

## 2022-06-04 DIAGNOSIS — E039 Hypothyroidism, unspecified: Secondary | ICD-10-CM | POA: Diagnosis present

## 2022-06-04 DIAGNOSIS — R0789 Other chest pain: Secondary | ICD-10-CM | POA: Diagnosis not present

## 2022-06-04 DIAGNOSIS — K219 Gastro-esophageal reflux disease without esophagitis: Secondary | ICD-10-CM | POA: Diagnosis present

## 2022-06-04 DIAGNOSIS — E1169 Type 2 diabetes mellitus with other specified complication: Secondary | ICD-10-CM | POA: Diagnosis not present

## 2022-06-04 DIAGNOSIS — F419 Anxiety disorder, unspecified: Secondary | ICD-10-CM | POA: Diagnosis present

## 2022-06-04 DIAGNOSIS — Z79890 Hormone replacement therapy (postmenopausal): Secondary | ICD-10-CM | POA: Diagnosis not present

## 2022-06-04 DIAGNOSIS — Z85850 Personal history of malignant neoplasm of thyroid: Secondary | ICD-10-CM | POA: Diagnosis not present

## 2022-06-04 DIAGNOSIS — Z79899 Other long term (current) drug therapy: Secondary | ICD-10-CM | POA: Diagnosis not present

## 2022-06-04 DIAGNOSIS — Z888 Allergy status to other drugs, medicaments and biological substances status: Secondary | ICD-10-CM

## 2022-06-04 DIAGNOSIS — F320 Major depressive disorder, single episode, mild: Secondary | ICD-10-CM | POA: Diagnosis not present

## 2022-06-04 DIAGNOSIS — R52 Pain, unspecified: Secondary | ICD-10-CM | POA: Diagnosis not present

## 2022-06-04 DIAGNOSIS — I110 Hypertensive heart disease with heart failure: Secondary | ICD-10-CM | POA: Diagnosis not present

## 2022-06-04 DIAGNOSIS — Z7982 Long term (current) use of aspirin: Secondary | ICD-10-CM

## 2022-06-04 DIAGNOSIS — Z6841 Body Mass Index (BMI) 40.0 and over, adult: Secondary | ICD-10-CM | POA: Diagnosis not present

## 2022-06-04 DIAGNOSIS — M7989 Other specified soft tissue disorders: Secondary | ICD-10-CM | POA: Diagnosis not present

## 2022-06-04 DIAGNOSIS — E119 Type 2 diabetes mellitus without complications: Secondary | ICD-10-CM

## 2022-06-04 DIAGNOSIS — Z833 Family history of diabetes mellitus: Secondary | ICD-10-CM | POA: Diagnosis not present

## 2022-06-04 DIAGNOSIS — I509 Heart failure, unspecified: Secondary | ICD-10-CM | POA: Diagnosis not present

## 2022-06-04 LAB — BASIC METABOLIC PANEL
Anion gap: 9 (ref 5–15)
BUN: 6 mg/dL — ABNORMAL LOW (ref 8–23)
CO2: 30 mmol/L (ref 22–32)
Calcium: 7.8 mg/dL — ABNORMAL LOW (ref 8.9–10.3)
Chloride: 99 mmol/L (ref 98–111)
Creatinine, Ser: 0.68 mg/dL (ref 0.44–1.00)
GFR, Estimated: >60 mL/min (ref >60)
Glucose, Bld: 98 mg/dL (ref 70–99)
Potassium: 3.7 mmol/L (ref 3.5–5.1)
Sodium: 138 mmol/L (ref 135–145)

## 2022-06-04 LAB — TROPONIN I (HIGH SENSITIVITY)
Troponin I (High Sensitivity): 3 ng/L (ref <18)
Troponin I (High Sensitivity): 5 ng/L (ref <18)

## 2022-06-04 LAB — LACTIC ACID, PLASMA
Lactic Acid, Venous: 1.9 mmol/L (ref 0.5–1.9)
Lactic Acid, Venous: 1.2 mmol/L (ref 0.5–1.9)
Lactic Acid, Venous: 2.4 mmol/L (ref 0.5–1.9)
Lactic Acid, Venous: 1.1 mmol/L (ref 0.5–1.9)

## 2022-06-04 LAB — D-DIMER, QUANTITATIVE: D-Dimer, Quant: 0.31 ug/mL-FEU (ref 0.00–0.50)

## 2022-06-04 LAB — PROTIME-INR
INR: 1.1 (ref 0.8–1.2)
Prothrombin Time: 14.5 seconds (ref 11.4–15.2)

## 2022-06-04 LAB — RESP PANEL BY RT-PCR (RSV, FLU A&B, COVID)  RVPGX2
Influenza A by PCR: NEGATIVE
Influenza B by PCR: NEGATIVE
Resp Syncytial Virus by PCR: NEGATIVE
SARS Coronavirus 2 by RT PCR: NEGATIVE

## 2022-06-04 LAB — CBC
HCT: 35 % — ABNORMAL LOW (ref 36.0–46.0)
Hemoglobin: 10.8 g/dL — ABNORMAL LOW (ref 12.0–15.0)
MCH: 30.7 pg (ref 26.0–34.0)
MCHC: 30.9 g/dL (ref 30.0–36.0)
MCV: 99.4 fL (ref 80.0–100.0)
Platelets: 240 10*3/uL (ref 150–400)
RBC: 3.52 MIL/uL — ABNORMAL LOW (ref 3.87–5.11)
RDW: 12.9 % (ref 11.5–15.5)
WBC: 18.9 10*3/uL — ABNORMAL HIGH (ref 4.0–10.5)
nRBC: 0 % (ref 0.0–0.2)

## 2022-06-04 LAB — APTT: aPTT: 30 seconds (ref 24–36)

## 2022-06-04 LAB — PROCALCITONIN: Procalcitonin: <0.1 ng/mL

## 2022-06-04 LAB — CBG MONITORING, ED: Glucose-Capillary: 115 mg/dL — ABNORMAL HIGH (ref 70–99)

## 2022-06-04 LAB — BRAIN NATRIURETIC PEPTIDE: B Natriuretic Peptide: 16.3 pg/mL (ref 0.0–100.0)

## 2022-06-04 MED ORDER — INSULIN ASPART 100 UNIT/ML IJ SOLN: 0.0000 [IU] | Freq: Three times a day (TID) | INTRAMUSCULAR | Status: DC

## 2022-06-04 MED ORDER — LISINOPRIL 10 MG PO TABS: 5.0000 mg | ORAL_TABLET | Freq: Every day | ORAL | Status: DC

## 2022-06-04 MED ORDER — SODIUM CHLORIDE 0.9 % IV SOLN
2.0000 g | Freq: Once | INTRAVENOUS | Status: AC
  Administered 2022-06-04: 2 g via INTRAVENOUS
  Filled 2022-06-04: qty 12.5

## 2022-06-04 MED ORDER — VANCOMYCIN HCL IN DEXTROSE 1-5 GM/200ML-% IV SOLN: 1000.0000 mg | Freq: Once | INTRAVENOUS | Status: DC

## 2022-06-04 MED ORDER — UMECLIDINIUM-VILANTEROL 62.5-25 MCG/ACT IN AEPB
1.0000 | INHALATION_SPRAY | Freq: Every day | RESPIRATORY_TRACT | Status: DC
  Administered 2022-06-05 – 2022-06-07 (×3): 1 via RESPIRATORY_TRACT
  Filled 2022-06-04: qty 14

## 2022-06-04 MED ORDER — SODIUM CHLORIDE 0.9 % IV SOLN: 2.0000 g | Freq: Three times a day (TID) | INTRAVENOUS | Status: DC

## 2022-06-04 MED ORDER — CLONAZEPAM 0.5 MG PO TABS
2.0000 mg | ORAL_TABLET | Freq: Three times a day (TID) | ORAL | Status: DC
  Administered 2022-06-04 – 2022-06-07 (×9): 2 mg via ORAL
  Filled 2022-06-04 (×9): qty 4

## 2022-06-04 MED ORDER — ACETAMINOPHEN 650 MG RE SUPP: 650.0000 mg | Freq: Four times a day (QID) | RECTAL | Status: DC | PRN

## 2022-06-04 MED ORDER — FUROSEMIDE 10 MG/ML IJ SOLN
20.0000 mg | Freq: Once | INTRAMUSCULAR | Status: AC
  Administered 2022-06-04: 20 mg via INTRAVENOUS
  Filled 2022-06-04: qty 2

## 2022-06-04 MED ORDER — BUSPIRONE HCL 5 MG PO TABS
5.0000 mg | ORAL_TABLET | Freq: Three times a day (TID) | ORAL | Status: DC
  Administered 2022-06-04 – 2022-06-07 (×9): 5 mg via ORAL
  Filled 2022-06-04 (×9): qty 1

## 2022-06-04 MED ORDER — OXYCODONE HCL 5 MG PO TABS
5.0000 mg | ORAL_TABLET | Freq: Four times a day (QID) | ORAL | Status: DC | PRN
  Administered 2022-06-05 – 2022-06-06 (×2): 5 mg via ORAL
  Filled 2022-06-04 (×3): qty 1

## 2022-06-04 MED ORDER — OXYCODONE-ACETAMINOPHEN 10-325 MG PO TABS: 1.0000 | ORAL_TABLET | Freq: Four times a day (QID) | ORAL | Status: DC | PRN

## 2022-06-04 MED ORDER — VANCOMYCIN HCL 750 MG/150ML IV SOLN: 750.0000 mg | Freq: Two times a day (BID) | INTRAVENOUS | Status: DC

## 2022-06-04 MED ORDER — ACETAMINOPHEN 325 MG PO TABS
650.0000 mg | ORAL_TABLET | Freq: Four times a day (QID) | ORAL | Status: DC | PRN
  Administered 2022-06-06: 650 mg via ORAL
  Filled 2022-06-04: qty 2

## 2022-06-04 MED ORDER — VANCOMYCIN HCL 1750 MG/350ML IV SOLN
1750.0000 mg | Freq: Once | INTRAVENOUS | Status: AC
  Administered 2022-06-04: 1750 mg via INTRAVENOUS
  Filled 2022-06-04: qty 350

## 2022-06-04 MED ORDER — ONDANSETRON HCL 4 MG/2ML IJ SOLN: 4.0000 mg | Freq: Four times a day (QID) | INTRAMUSCULAR | Status: DC | PRN

## 2022-06-04 MED ORDER — OXYCODONE-ACETAMINOPHEN 5-325 MG PO TABS
1.0000 | ORAL_TABLET | Freq: Four times a day (QID) | ORAL | Status: DC | PRN
  Administered 2022-06-05 – 2022-06-07 (×3): 1 via ORAL
  Filled 2022-06-04 (×3): qty 1

## 2022-06-04 MED ORDER — VANCOMYCIN HCL 750 MG/150ML IV SOLN
750.0000 mg | Freq: Two times a day (BID) | INTRAVENOUS | Status: DC
  Filled 2022-06-04: qty 150

## 2022-06-04 MED ORDER — ENOXAPARIN SODIUM 40 MG/0.4ML IJ SOSY
40.0000 mg | PREFILLED_SYRINGE | INTRAMUSCULAR | Status: DC
  Administered 2022-06-04 – 2022-06-06 (×3): 40 mg via SUBCUTANEOUS
  Filled 2022-06-04 (×3): qty 0.4

## 2022-06-04 MED ORDER — FUROSEMIDE 10 MG/ML IJ SOLN: 40.0000 mg | Freq: Once | INTRAMUSCULAR | Status: DC

## 2022-06-04 MED ORDER — ONDANSETRON HCL 4 MG PO TABS: 4.0000 mg | ORAL_TABLET | Freq: Four times a day (QID) | ORAL | Status: DC | PRN

## 2022-06-04 MED ORDER — ACETAMINOPHEN 500 MG PO TABS
1000.0000 mg | ORAL_TABLET | Freq: Once | ORAL | Status: AC
  Administered 2022-06-04: 1000 mg via ORAL
  Filled 2022-06-04: qty 2

## 2022-06-04 MED ORDER — INSULIN ASPART 100 UNIT/ML IJ SOLN: 0.0000 [IU] | Freq: Every day | INTRAMUSCULAR | Status: DC

## 2022-06-04 MED ORDER — VANCOMYCIN HCL 750 MG/150ML IV SOLN
750.0000 mg | Freq: Two times a day (BID) | INTRAVENOUS | Status: DC
  Administered 2022-06-05 – 2022-06-06 (×4): 750 mg via INTRAVENOUS
  Filled 2022-06-04 (×3): qty 150

## 2022-06-04 MED ORDER — LACTATED RINGERS IV SOLN: INTRAVENOUS | Status: DC

## 2022-06-04 NOTE — ED Provider Notes (Signed)
[facility] Provider Note   CSN: [id number] Arrival date & time: 06/04/22  1246     History  Chief Complaint  Patient presents with   Shortness of Breath   Chest Pain    Teale Yock is a 62 y.o. female with past medical history hypertension, hyperlipidemia, anemia, chronic pain, HFpEF presents to the ED complaining of chest pressure, cough, shortness of breath, and increased lower extremity edema for the last 2 days.  Patient reports that she was hospitalized last week and discharged 3 days ago.  She reports that this was at [facility], however, most recent admission we have in our system shows that she was discharged on 2/8 instead.  Patient states that she has been compliant with her home medications.  She arrives to the ED with a fever of 101.2 Fahrenheit.  She reports no fever at home. She denies abdominal pain, nausea, vomiting, diarrhea, or urinary symptoms. She reports she had good control of her symptoms while hospitalized with use of IV Lasix daily but she was not sent home on Lasix.  Per discharge summary, patient was discharged home on torsemide 20 mg twice daily as well as home oxygen as needed.  Echo during previous admission showed EF of 55 to 60% with grade 1 diastolic dysfunction.       Home Medications Prior to Admission medications   Medication Sig Start Date End Date Taking? Authorizing Provider  acetaminophen (TYLENOL) 500 MG tablet Take 1,000 mg by mouth every 6 (six) hours as needed for moderate pain or headache.    [provider]  albuterol (PROVENTIL HFA;VENTOLIN HFA) 108 (90 Base) MCG/ACT inhaler Inhale 1-2 puffs into the lungs every 6 (six) hours as needed for wheezing or shortness of breath. 04/23/18   Long, [name], MD  allopurinol (ZYLOPRIM) 100 MG tablet Take 100 mg by mouth daily.    [provider]  aspirin EC 81 MG tablet Take 81 mg by mouth daily. Swallow whole.    [provider]   blood glucose meter kit and supplies KIT Dispense based on patient and insurance preference. Use up to four times daily as directed. 12/25/20   Little Ishikawa, MD  busPIRone (BUSPAR) 5 MG tablet Take 5 mg by mouth 3 (three) times daily.    [provider]  calcitRIOL (ROCALTROL) 0.5 MCG capsule Take 1 capsule (0.5 mcg total) by mouth daily. 09/26/16   Hongalgi, [name], MD  carvedilol (COREG) 3.125 MG tablet Take 1 tablet (3.125 mg total) by mouth 2 (two) times daily with a meal. 05/18/22 06/17/22  Lucienne Minks, MD  clonazePAM (KLONOPIN) 2 MG tablet Take 1 tablet (2 mg total) by mouth 3 (three) times daily. for anxiety Patient taking differently: Take 2 mg by mouth 3 (three) times daily. 06/23/19   [name], MD  cyclobenzaprine (FLEXERIL) 10 MG tablet Take 10 mg by mouth 3 (three) times daily as needed for muscle spasms. 11/19/19   [provider]  dicyclomine (BENTYL) 20 MG tablet Take 1 tablet (20 mg total) by mouth 2 (two) times daily. 03/09/22   Maudie Flakes, MD  EPINEPHrine 0.3 mg/0.3 mL IJ SOAJ injection Inject 0.3 mg into the muscle once as needed for anaphylaxis.    [provider]  fenofibrate (TRICOR) 145 MG tablet Take 145 mg by mouth daily. 06/07/21   [provider]  FEROSUL 325 (65 Fe) MG tablet Take 325 mg by mouth daily. 03/22/17  [provider]  ipratropium-albuterol (DUONEB) 0.5-2.5 (3) MG/3ML SOLN Take 3 mLs by nebulization every 6 (six) hours as needed (for shortness of breath). 04/05/17   [provider]  levothyroxine (SYNTHROID, LEVOTHROID) 112 MCG tablet Take 224 mcg by mouth daily before breakfast. 07/11/17   [provider]  lisinopril (ZESTRIL) 5 MG tablet Take 1 tablet (5 mg total) by mouth daily. Please hold this medication for three days, please check your blood pressure at home, bring in record for your pcp to review 05/18/22   Lucienne Minks, MD  loperamide (IMODIUM) 2 MG capsule Take 1 capsule (2 mg  total) by mouth 4 (four) times daily as needed for diarrhea or loose stools. 03/09/22   Maudie Flakes, MD  loratadine (CLARITIN) 10 MG tablet Take 10 mg by mouth daily as needed for allergies.    [provider]  nitroGLYCERIN (NITROSTAT) 0.4 MG SL tablet Place 1 tablet (0.4 mg total) under the tongue every 5 (five) minutes as needed for chest pain. 06/19/18   [name], MD  nystatin cream (MYCOSTATIN) Apply 1 Application topically 2 (two) times daily as needed for dry skin. 05/08/22   [provider]  omeprazole (PRILOSEC) 20 MG capsule Take 20 mg by mouth 2 (two) times daily as needed (acid reflux). 05/19/21   [provider]  ondansetron (ZOFRAN) 4 MG tablet Take 4 mg by mouth every 8 (eight) hours as needed for nausea or vomiting. 01/16/22   [provider]  oxyCODONE-acetaminophen (PERCOCET) 10-325 MG tablet Take 1 tablet by mouth every 6 (six) hours as needed for pain. 12/03/19   [provider]  OXYGEN Inhale 3 L into the lungs continuous.    [provider]  phentermine (ADIPEX-P) 37.5 MG tablet Take 37.5 mg by mouth daily. 05/25/21   [provider]  polyethylene glycol (MIRALAX / GLYCOLAX) 17 g packet Take 17 g by mouth daily as needed for mild constipation. 02/03/22   Florencia Reasons, MD  potassium chloride SA (K-DUR,KLOR-CON) 20 MEQ tablet Take 1 tablet (20 mEq total) by mouth 2 (two) times daily. 07/24/18   [name], MD  rosuvastatin (CRESTOR) 10 MG tablet Take 10 mg by mouth every evening. 11/19/19   [provider]  senna-docusate (SENOKOT-S) 8.6-50 MG tablet Take 1 tablet by mouth daily.    [provider]  torsemide (DEMADEX) 20 MG tablet Take 1 tablet (20 mg total) by mouth 2 (two) times daily. 05/18/22 06/17/22  Lucienne Minks, MD  traZODone (DESYREL) 50 MG tablet Take 100 mg by mouth at bedtime. 11/19/19   [provider]  triamcinolone cream (KENALOG) 0.1 % Apply 1 application. topically 2 (two)  times daily as needed (irrritation). 09/10/20   [provider]  umeclidinium-vilanterol (ANORO ELLIPTA) 62.5-25 MCG/INH AEPB Inhale 1 puff into the lungs daily.    [provider]  valACYclovir (VALTREX) 1000 MG tablet Take 1,000 mg by mouth daily. 01/19/22   [provider]      Allergies    Bee venom, Fioricet [butalbital-apap-caffeine], Ibuprofen, Lamisil [terbinafine], and Nsaids    Review of Systems   Review of Systems  All other systems reviewed and are negative.   Physical Exam Updated Vital Signs BP 109/68   Pulse 97   Temp 100 F (37.8 C) (Oral)   Resp 19   SpO2 97%  Physical Exam Vitals and nursing note reviewed. Exam conducted with a chaperone present.  Constitutional:      General: She is  not in acute distress.    Appearance: Normal appearance. She is not ill-appearing, toxic-appearing or diaphoretic.  HENT:     Head: Normocephalic and atraumatic.     Mouth/Throat:     Mouth: Mucous membranes are moist.  Eyes:     Extraocular Movements: Extraocular movements intact.     Conjunctiva/sclera: Conjunctivae normal.     Pupils: Pupils are equal, round, and reactive to light.  Cardiovascular:     Rate and Rhythm: Normal rate and regular rhythm.  Pulmonary:     Effort: No respiratory distress.     Breath sounds: No stridor. Decreased breath sounds and rhonchi (slight diffusely) present. No wheezing or rales.     Comments: Slightly increased work of breathing on 3 L nasal cannula oxygen Chest:     Chest wall: No deformity, tenderness, crepitus or edema.  Abdominal:     General: Abdomen is flat.     Palpations: Abdomen is soft. There is no shifting dullness or fluid wave.     Tenderness: There is no abdominal tenderness. There is no guarding or rebound.  Genitourinary:    Pubic Area: No rash.      Comments: Small circular area of induration to the right perineum just distal to labia majora that is tender to palpation, no fluctuance, no  overlying skin changes, no open wounds, no drainage Musculoskeletal:        General: Normal range of motion.     Cervical back: Normal range of motion and neck supple.     Right lower leg: No edema.     Left lower leg: No edema.  Skin:    General: Skin is warm and dry.     Capillary Refill: Capillary refill takes less than 2 seconds.     Findings: No rash.  Neurological:     General: No focal deficit present.     Mental Status: She is alert and oriented to person, place, and time. Mental status is at baseline.  Psychiatric:        Mood and Affect: Mood is anxious.        Behavior: Behavior normal.     ED Results / Procedures / Treatments   Labs (all labs ordered are listed, but only abnormal results are displayed) Labs Reviewed  BASIC METABOLIC PANEL - Abnormal; Notable for the following components:      Result Value   BUN 6 (*)    Calcium 7.8 (*)    All other components within normal limits  CBC - Abnormal; Notable for the following components:   WBC 18.9 (*)    RBC 3.52 (*)    Hemoglobin 10.8 (*)    HCT 35.0 (*)    All other components within normal limits  RESP PANEL BY RT-PCR (RSV, FLU A&B, COVID)  RVPGX2  CULTURE, BLOOD (ROUTINE X 2)  CULTURE, BLOOD (ROUTINE X 2)  RESPIRATORY PANEL BY PCR  MRSA NEXT GEN BY PCR, NASAL  BRAIN NATRIURETIC PEPTIDE  URINALYSIS, ROUTINE W REFLEX MICROSCOPIC  LACTIC ACID, PLASMA  LACTIC ACID, PLASMA  PROTIME-INR  APTT  PROCALCITONIN  TROPONIN I (HIGH SENSITIVITY)  TROPONIN I (HIGH SENSITIVITY)    EKG EKG Interpretation  Date/Time:  Sunday June 04 2022 13:25:03 EST Ventricular Rate:  100 PR Interval:  152 QRS Duration: 82 QT Interval:  333 QTC Calculation: 430 R Axis:   49 Text Interpretation: Sinus tachycardia Since last tracing rate faster Confirmed by Dorie Rank [phone]) on 06/04/2022 1:30:14 PM  Radiology DG Chest [facility]  1 View  Result Date: 06/04/2022 CLINICAL DATA:  Chest pain and shortness of breath. EXAM: PORTABLE  CHEST 1 VIEW COMPARISON:  Chest x-rays dated 05/14/2022 and 01/31/2022. FINDINGS: Heart size and mediastinal contours are stable. Chronic bibasilar atelectasis. No new lung findings. No pleural effusion or pneumothorax is seen. IMPRESSION: No active disease. Chronic bibasilar atelectasis. Electronically Signed   By: Franki Cabot M.D.   On: 06/04/2022 14:00    Procedures Procedures    Medications Ordered in ED Medications  ceFEPIme (MAXIPIME) 2 g in sodium chloride 0.9 % 100 mL IVPB (has no administration in time range)  vancomycin (VANCOREADY) IVPB 1750 mg/350 mL (has no administration in time range)  ceFEPIme (MAXIPIME) 2 g in sodium chloride 0.9 % 100 mL IVPB (has no administration in time range)  vancomycin (VANCOREADY) IVPB 750 mg/150 mL (has no administration in time range)  acetaminophen (TYLENOL) tablet 1,000 mg (1,000 mg Oral Given 06/04/22 1321)    ED Course/ Medical Decision Making/ A&P                             Medical Decision Making Amount and/or Complexity of Data Reviewed Labs: ordered. Decision-making details documented in ED Course. Radiology: ordered. Decision-making details documented in ED Course. ECG/medicine tests: ordered. Decision-making details documented in ED Course.  Risk Decision regarding hospitalization.   Medical Decision Making:   Laquashia Mcanulty is a 62 y.o. female who presented to the ED today with chest pressure, cough, shortness of breath detailed above.    External chart has been reviewed including recent hospital admission. Patient's presentation is complicated by their history of morbid obesity, heart failure.  Patient placed on continuous vitals and telemetry monitoring while in ED which was reviewed periodically.  Complete initial physical exam performed, notably the patient  was with increased work of breathing and diminished breath sounds but in no acute distress and neurologically intact.    Reviewed and confirmed nursing documentation  for past medical history, family history, social history.    Initial Assessment:   With the patient's presentation of chest pain and shortness of breath, most likely diagnosis is sepsis. Other diagnoses were considered including (but not limited to) acute heart failure exacerbation, pneumonia, UTI, wound infection, cellulitis, ACS, COVID, RSV, influenza, DVT/PE. These are considered less likely due to history of present illness and physical exam findings.   This is most consistent with an acute complicated illness  Initial Plan:   Screening labs including CBC and Metabolic panel to evaluate for infectious or metabolic etiology of disease.  Urinalysis with reflex culture ordered to evaluate for UTI or relevant urologic/nephrologic pathology.  CXR to evaluate for structural/infectious intrathoracic pathology.  EKG and troponins to evaluate for cardiac pathology BNP to evaluate for heart failure Lactic and blood cultures for sepsis evaluation Objective evaluation as below reviewed   Initial Study Results:   Laboratory  All laboratory results reviewed without evidence of clinically relevant pathology.   Exceptions include: WBC 18.9, HgB 10.8   EKG EKG was reviewed independently. Rate, rhythm, axis, intervals all examined and without medically relevant abnormality apart from mild sinus tachycardia. ST segments without concerns for elevations.    Radiology:  All images reviewed independently. Agree with radiology report at this time.   DG Chest Port 1 View  Result Date: 06/04/2022 CLINICAL DATA:  Chest pain and shortness of breath. EXAM: PORTABLE CHEST 1 VIEW COMPARISON:  Chest x-rays dated 05/14/2022 and 01/31/2022. FINDINGS: Heart  size and mediastinal contours are stable. Chronic bibasilar atelectasis. No new lung findings. No pleural effusion or pneumothorax is seen. IMPRESSION: No active disease. Chronic bibasilar atelectasis. Electronically Signed   By: Franki Cabot M.D.   On: 06/04/2022  14:00   ECHOCARDIOGRAM COMPLETE  Result Date: 05/15/2022    ECHOCARDIOGRAM REPORT   Patient Name:   SHERADYN KWIATEK Date of Exam: 05/15/2022 Medical Rec #:  [id number]     Height:       65.0 in Accession #:    [id number]    Weight:       280.3 lb Date of Birth:  04/06/1961    BSA:          2.283 m Patient Age:    42 years      BP:           117/69 mmHg Patient Gender: F             HR:           83 bpm. Exam Location:  Inpatient Procedure: 2D Echo, Cardiac Doppler, Color Doppler and Intracardiac            Opacification Agent Indications:    [id number] Acute diastolic (congestive) heart failure  History:        Patient has prior history of Echocardiogram examinations. CHF,                 COPD; Risk Factors:Diabetes and Hypertension.  Sonographer:    Phineas Douglas Referring Phys: Phillips  1. Left ventricular ejection fraction, by estimation, is 55 to 60%. The left ventricle has normal function. The left ventricle has no regional wall motion abnormalities. There is mild concentric left ventricular hypertrophy. Left ventricular diastolic parameters are consistent with Grade I diastolic dysfunction (impaired relaxation).  2. Very mildly D-shaped interventricular septum suggestive of RV pressure/volume overload. Right ventricular systolic function is normal. The right ventricular size is normal.  3. Right atrial size was mildly dilated.  4. The mitral valve is normal in structure. Trivial mitral valve regurgitation. No evidence of mitral stenosis.  5. The aortic valve is tricuspid. Aortic valve regurgitation is not visualized. No aortic stenosis is present.  6. IVC not visualized. Peak RV-RA gradient 17 mmHg.  7. Technically difficult study with poor acoustic windows. FINDINGS  Left Ventricle: Left ventricular ejection fraction, by estimation, is 55 to 60%. The left ventricle has normal function. The left ventricle has no regional wall motion abnormalities. Definity contrast agent was given IV to  delineate the left ventricular  endocardial borders. The left ventricular internal cavity size was normal in size. There is mild concentric left ventricular hypertrophy. Left ventricular diastolic parameters are consistent with Grade I diastolic dysfunction (impaired relaxation). Right Ventricle: Very mildly D-shaped interventricular septum suggestive of RV pressure/volume overload. The right ventricular size is normal. No increase in right ventricular wall thickness. Right ventricular systolic function is normal. Left Atrium: Left atrial size was normal in size. Right Atrium: Right atrial size was mildly dilated. Pericardium: There is no evidence of pericardial effusion. Mitral Valve: The mitral valve is normal in structure. There is mild calcification of the mitral valve leaflet(s). Mild mitral annular calcification. Trivial mitral valve regurgitation. No evidence of mitral valve stenosis. Tricuspid Valve: The tricuspid valve is normal in structure. Tricuspid valve regurgitation is trivial. Aortic Valve: The aortic valve is tricuspid. Aortic valve regurgitation is not visualized. No aortic stenosis is present. Pulmonic Valve: The pulmonic valve was normal in  structure. Pulmonic valve regurgitation is not visualized. Aorta: The aortic root is normal in size and structure. Venous: The inferior vena cava was not well visualized. IAS/Shunts: No atrial level shunt detected by color flow Doppler.  LEFT VENTRICLE PLAX 2D LVIDd:         4.00 cm      Diastology LVIDs:         2.90 cm      LV e' medial:    10.10 cm/s LV PW:         1.20 cm      LV E/e' medial:  10.1 LV IVS:        1.20 cm      LV e' lateral:   7.71 cm/s LVOT diam:     2.00 cm      LV E/e' lateral: 13.2 LV SV:         65 LV SV Index:   29 LVOT Area:     3.14 cm  LV Volumes (MOD) LV vol d, MOD A2C: 196.0 ml LV vol d, MOD A4C: 192.0 ml LV vol s, MOD A2C: 74.7 ml LV vol s, MOD A4C: 76.5 ml LV SV MOD A2C:     121.3 ml LV SV MOD A4C:     192.0 ml LV SV MOD BP:       121.1 ml RIGHT VENTRICLE RV Basal diam:  4.30 cm RV S prime:     16.30 cm/s TAPSE (M-mode): 2.8 cm LEFT ATRIUM             Index        RIGHT ATRIUM           Index LA diam:        3.80 cm 1.66 cm/m   RA Area:     21.10 cm LA Vol (A2C):   60.7 ml 26.58 ml/m  RA Volume:   52.80 ml  23.12 ml/m LA Vol (A4C):   57.2 ml 25.05 ml/m LA Biplane Vol: 59.8 ml 26.19 ml/m  AORTIC VALVE LVOT Vmax:   97.00 cm/s LVOT Vmean:  63.300 cm/s LVOT VTI:    0.208 m  AORTA Ao Root diam: 3.40 cm Ao Asc diam:  3.50 cm MITRAL VALVE                TRICUSPID VALVE MV Area (PHT): 3.07 cm     TR Peak grad:   17.0 mmHg MV Decel Time: 247 msec     TR Vmax:        206.00 cm/s MV E velocity: 102.00 cm/s MV A velocity: 96.90 cm/s   SHUNTS MV E/A ratio:  1.05         Systemic VTI:  0.21 m                             Systemic Diam: 2.00 cm Dalton McleanMD Electronically signed by Franki Monte Signature Date/Time: 05/15/2022/1:27:30 PM    Final    DG Chest Portable 1 View  Result Date: 05/14/2022 CLINICAL DATA:  Cough, weakness, leg swelling. EXAM: PORTABLE CHEST 1 VIEW COMPARISON:  01/31/2022. FINDINGS: Heart is enlarged and the mediastinal contour stable. There is atherosclerotic calcification of the aorta. The pulmonary vasculature is distended. Patchy airspace disease is present at the lung bases bilaterally. No effusion or pneumothorax. Surgical clips are noted in the cervical soft tissues. No acute osseous abnormality. IMPRESSION: 1. Cardiomegaly with pulmonary vascular congestion. 2. Patchy airspace disease  at the lung bases, possible edema or infiltrate. Electronically Signed   By: Brett Fairy M.D.   On: 05/14/2022 23:37      Consults: Case discussed with hospitalist who accepts admission.   Final Assessment and Plan:   This is a 62 year old morbidly obese female post recent discharge from hospital for HFpEF exacerbation who presents to ED c/o chest pressure, shortness of breath, cough, and increased LE edema. Pt reports  previous hospitalization she was receiving IV Lasix daily and this controlled her symptoms well. She was discharged home on Torsemide and 3L home oxygen which she has been using but has had worsening condition since discharge. On initial exam, she has increased work of breathing on 3L O2 requiring increase to 5-6L for resolution of tachypnea and 2+ pitting edema to bilateral LE extremities thus with high suspicion of acute heart failure exacerbation. She was febrile and tachycardic on arrival to ED meeting sepsis criteria as well. With signs of volume overload, fluid resuscitation not initiated pending further workup and as pt remained stable. Workup revealed essentially unremarkable chest x-ray. BNP also normal though may be falsely low in setting of obesity. Viral swabs negative. EKG without acute ST-T changes and troponin negative. Pt has leukocytosis at 18.9 that is difficult to explain in addition to fever. On later re-exam, she reports wound to genitalia and on examination in conjunction with MD small area of induration palpated to perineum but no fluctuance, drainage, or signs of abscess or cellulitis. No other source of pt's symptoms identified. With increased oxygen requirement meeting sepsis criteria and clinical signs of volume overload, diuresed with IV Lasix '40mg'$ , started on IV antibiotics, and admitted to hospitalist. Pt stable at time of admission and agreeable with plan to admit to hospital.    Clinical Impression:  1. Acute on chronic diastolic congestive heart failure ([facility])   2. Sepsis without acute organ dysfunction, due to unspecified organism [facility])      Admit           Final Clinical Impression(s) / ED Diagnoses Final diagnoses:  Acute on chronic diastolic congestive heart failure ([place])  Sepsis without acute organ dysfunction, due to unspecified organism [facility])    Rx / DC Orders ED Discharge Orders     None         Turner Daniels 06/04/22 Lillette Boxer, MD 06/05/22 0700

## 2022-06-04 NOTE — ED Triage Notes (Signed)
Pt BIB [facility] from home d/t Sob and central, non-radiating  CP that has been going on for over 3 days. Pt was here on Sunday for the same, was dc'ed on Thursday. Hx chf. Pt noticed increased edema on her feet. Pt received 324 ASA and 0.4 NTG w EMS. Pt wear 3L O2 at baseline.

## 2022-06-04 NOTE — Progress Notes (Signed)
Pharmacy Antibiotic Note  Michele Owens is a 62 y.o. female admitted on 06/04/2022 presenting with SOB, concern for pna.  Pharmacy has been consulted for vancomycin and cefepime dosing.  Plan: Vancomycin 1750 mg IV x 1, then 750 mg IV q 12h (eAUC 490) Add MRSA PCR Cefepime 2g IV every 8 hours Monitor renal function, Cx/PCR to narrow Vancomycin levels as indicated     Temp (24hrs), Avg:100.6 F (38.1 C), Min:100 F (37.8 C), Max:101.2 F (38.4 C)  Recent Labs  Lab 06/04/22 1314  WBC 18.9*  CREATININE 0.68    CrCl cannot be calculated (Unknown ideal weight.).    Allergies  Allergen Reactions   Bee Venom Swelling and Other (See Comments)    "All over my body" (swelling)   Fioricet [Butalbital-Apap-Caffeine] Nausea And Vomiting and Rash   Ibuprofen Rash and Other (See Comments)    Severe rash   Lamisil [Terbinafine] Rash and Other (See Comments)    Pt states this causes her to "feel funny"   Nsaids Other (See Comments)    Per MD's orders     Bertis Ruddy, PharmD, [place] Pharmacist ED Pharmacist Phone # [phone] 06/04/2022 3:12 PM

## 2022-06-04 NOTE — H&P (Addendum)
History and Physical    Michele Owens [id number] DOB: 1961/01/11 DOA: 06/04/2022  I have briefly reviewed the patient's prior medical records in [place]  PCP: [name], MD  Patient coming from: home  Chief Complaint: multiple complaints-- fever and SOB  HPI: Michele Owens is a 62 y.o. female with medical history significant of  DM2, dCHF, COPD, HTN.  Recently in the hospital from 2/4- 2/8 with acute HFpEF exacerbation w/ acute pulmonary edema.  Patient was sent home with torosemide and ? O2-- ER PA states 3L is her home O2 but documentation from prior hospitalization is not clear with what patient was d/c'd on.   Patient not clear if she had a fever at home but did well initially after discharge but then developed SOB and came to the ER.  Patient does say she has a boil on her labia.  She has not been able to sleep flat but this is normal for her. 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  In the ER, an x ray was done that showed no fluid/pna.  WBC count elevated and vital signs significant for low BP and temp >100.     Labs were ordered but not resulted-- lactic acid still pending. Lasix was ordered but I cancelled due to low BP.  IV abx given by ER PA for possible PNA.     Review of Systems: As per HPI otherwise 10 point review of systems negative.   Past Medical History:  Diagnosis Date   Anxiety    Arthritis    Asthma    Chronic diastolic CHF (congestive heart failure) (HCC)    COPD (chronic obstructive pulmonary disease) (HCC)    Uses Oxygen at night   Depression    Diabetes mellitus without complication (HCC)    GERD (gastroesophageal reflux disease)    Gout    Headache    migraines   History of DVT of lower extremity    Hypertension    Thyroid cancer ([place]) 09/23/2016    Past Surgical History:  Procedure Laterality Date   BUNIONECTOMY Bilateral    CARDIAC CATHETERIZATION N/A 06/23/2015   Procedure: Right/Left Heart Cath and  Coronary Angiography;  Surgeon: [name], MD;  Location: [facility];  Service: Cardiovascular;  Laterality: N/A;   COLONOSCOPY WITH PROPOFOL N/A 12/15/2015   Procedure: COLONOSCOPY WITH PROPOFOL;  Surgeon: [name], MD;  Location: [facility];  Service: Endoscopy;  Laterality: N/A;   RIGHT HEART CATH N/A 10/01/2019   Procedure: RIGHT HEART CATH;  Surgeon: [name], MD;  Location: [facility];  Service: Cardiovascular;  Laterality: N/A;   THYROIDECTOMY  09/19/2016   THYROIDECTOMY N/A 09/19/2016   Procedure: TOTAL THYROIDECTOMY;  Surgeon: [name], MD;  Location: [facility];  Service: General;  Laterality: N/A;   TONSILLECTOMY     TOTAL ABDOMINAL HYSTERECTOMY  07/14/10     reports that she quit smoking about 5 years ago. Her smoking use included cigarettes. She has a 20.50 pack-year smoking history. She has never used smokeless tobacco. She reports that she does not currently use alcohol. She reports that she does not currently use drugs after having used the following drugs: Marijuana and Cocaine.  Allergies  Allergen Reactions   Bee Venom Swelling and Other (See Comments)    "All over my body" (swelling)   Fioricet [Butalbital-Apap-Caffeine] Nausea And Vomiting and Rash   Ibuprofen Rash and Other (See Comments)  Severe rash   Lamisil [Terbinafine] Rash and Other (See Comments)    Pt states this causes her to "feel funny"   Nsaids Other (See Comments)    Per MD's orders     Family History  Problem Relation Age of Onset   Cancer Father        thought to be due to exposure to concrete   Diabetes Mother    Thyroid cancer Mother        dx in her 71s-60s   Cancer Maternal Uncle        2 uncles with cancer NOS   Brain cancer Paternal Aunt    Cancer Cousin        maternal first cousin - NOS   Cancer Cousin        maternal first cousin - NOS    Prior to Admission medications   Medication Sig Start Date End Date Taking? Authorizing Provider   acetaminophen (TYLENOL) 500 MG tablet Take 1,000 mg by mouth every 6 (six) hours as needed for moderate pain or headache.    [provider]  albuterol (PROVENTIL HFA;VENTOLIN HFA) 108 (90 Base) MCG/ACT inhaler Inhale 1-2 puffs into the lungs every 6 (six) hours as needed for wheezing or shortness of breath. 04/23/18   Long, [name], MD  allopurinol (ZYLOPRIM) 100 MG tablet Take 100 mg by mouth daily.    [provider]  aspirin EC 81 MG tablet Take 81 mg by mouth daily. Swallow whole.    [provider]  blood glucose meter kit and supplies KIT Dispense based on patient and insurance preference. Use up to four times daily as directed. 12/25/20   Little Ishikawa, MD  busPIRone (BUSPAR) 5 MG tablet Take 5 mg by mouth 3 (three) times daily.    [provider]  calcitRIOL (ROCALTROL) 0.5 MCG capsule Take 1 capsule (0.5 mcg total) by mouth daily. 09/26/16   Hongalgi, [name], MD  carvedilol (COREG) 3.125 MG tablet Take 1 tablet (3.125 mg total) by mouth 2 (two) times daily with a meal. 05/18/22 06/17/22  Lucienne Minks, MD  clonazePAM (KLONOPIN) 2 MG tablet Take 1 tablet (2 mg total) by mouth 3 (three) times daily. for anxiety Patient taking differently: Take 2 mg by mouth 3 (three) times daily. 06/23/19   [name], MD  cyclobenzaprine (FLEXERIL) 10 MG tablet Take 10 mg by mouth 3 (three) times daily as needed for muscle spasms. 11/19/19   [provider]  dicyclomine (BENTYL) 20 MG tablet Take 1 tablet (20 mg total) by mouth 2 (two) times daily. 03/09/22   Maudie Flakes, MD  EPINEPHrine 0.3 mg/0.3 mL IJ SOAJ injection Inject 0.3 mg into the muscle once as needed for anaphylaxis.    [provider]  fenofibrate (TRICOR) 145 MG tablet Take 145 mg by mouth daily. 06/07/21   [provider]  FEROSUL 325 (65 Fe) MG tablet Take 325 mg by mouth daily. 03/22/17   [provider]  ipratropium-albuterol (DUONEB) 0.5-2.5 (3) MG/3ML SOLN  Take 3 mLs by nebulization every 6 (six) hours as needed (for shortness of breath). 04/05/17   [provider]  levothyroxine (SYNTHROID, LEVOTHROID) 112 MCG tablet Take 224 mcg by mouth daily before breakfast. 07/11/17   [provider]  lisinopril (ZESTRIL) 5 MG tablet Take 1 tablet (5 mg total) by mouth daily. Please hold this medication for three days, please check your blood pressure at home, bring in record for your pcp to review  05/18/22   Lucienne Minks, MD  loperamide (IMODIUM) 2 MG capsule Take 1 capsule (2 mg total) by mouth 4 (four) times daily as needed for diarrhea or loose stools. 03/09/22   Maudie Flakes, MD  loratadine (CLARITIN) 10 MG tablet Take 10 mg by mouth daily as needed for allergies.    [provider]  nitroGLYCERIN (NITROSTAT) 0.4 MG SL tablet Place 1 tablet (0.4 mg total) under the tongue every 5 (five) minutes as needed for chest pain. 06/19/18   [name], MD  nystatin cream (MYCOSTATIN) Apply 1 Application topically 2 (two) times daily as needed for dry skin. 05/08/22   [provider]  omeprazole (PRILOSEC) 20 MG capsule Take 20 mg by mouth 2 (two) times daily as needed (acid reflux). 05/19/21   [provider]  ondansetron (ZOFRAN) 4 MG tablet Take 4 mg by mouth every 8 (eight) hours as needed for nausea or vomiting. 01/16/22   [provider]  oxyCODONE-acetaminophen (PERCOCET) 10-325 MG tablet Take 1 tablet by mouth every 6 (six) hours as needed for pain. 12/03/19   [provider]  OXYGEN Inhale 3 L into the lungs continuous.    [provider]  phentermine (ADIPEX-P) 37.5 MG tablet Take 37.5 mg by mouth daily. 05/25/21   [provider]  polyethylene glycol (MIRALAX / GLYCOLAX) 17 g packet Take 17 g by mouth daily as needed for mild constipation. 02/03/22   Florencia Reasons, MD  potassium chloride SA (K-DUR,KLOR-CON) 20 MEQ tablet Take 1 tablet (20 mEq total) by mouth 2 (two) times daily. 07/24/18    [name], MD  rosuvastatin (CRESTOR) 10 MG tablet Take 10 mg by mouth every evening. 11/19/19   [provider]  senna-docusate (SENOKOT-S) 8.6-50 MG tablet Take 1 tablet by mouth daily.    [provider]  torsemide (DEMADEX) 20 MG tablet Take 1 tablet (20 mg total) by mouth 2 (two) times daily. 05/18/22 06/17/22  Lucienne Minks, MD  traZODone (DESYREL) 50 MG tablet Take 100 mg by mouth at bedtime. 11/19/19   [provider]  triamcinolone cream (KENALOG) 0.1 % Apply 1 application. topically 2 (two) times daily as needed (irrritation). 09/10/20   [provider]  umeclidinium-vilanterol (ANORO ELLIPTA) 62.5-25 MCG/INH AEPB Inhale 1 puff into the lungs daily.    [provider]  valACYclovir (VALTREX) 1000 MG tablet Take 1,000 mg by mouth daily. 01/19/22   [provider]    Physical Exam: Vitals:   06/04/22 1430 06/04/22 1449 06/04/22 1452 06/04/22 1530  BP: 109/68   121/67  Pulse: 98 97  99  Resp: (!) 26 19  (!) 27  Temp:   100 F (37.8 C)   TempSrc:   Oral   SpO2: 97% 97%  97%      Constitutional: NAD, calm, comfortable Eyes: PERRL, lids and conjunctivae normal Neck: normal, supple, no masses, no thyromegaly Respiratory: diminished Cardiovascular: Regular rate and rhythm, no murmurs / rubs / gallops. Min Lower extremity edema.  Abdomen: no tenderness, no masses palpated. Bowel sounds positive.  Musculoskeletal: no clubbing / cyanosis. Normal muscle tone.  Skin: no rashes, lesions, ulcers. Small firm area on right labia Neurologic: CN 2-12 grossly intact. Strength 5/5 in all 4.  Psychiatric: poor insight. Alert and oriented x 3.   Labs on Admission: I have personally reviewed following labs and imaging studies  CBC: Recent Labs  Lab 06/04/22 1314  WBC 18.9*  HGB 10.8*  HCT 35.0*  MCV 99.4  PLT [id number]   Basic Metabolic Panel: Recent Labs  Lab 06/04/22 1314  NA 138  K 3.7  CL 99  CO2 30  GLUCOSE 98  BUN 6*   CREATININE 0.68  CALCIUM 7.8*   GFR: CrCl cannot be calculated (Unknown ideal weight.). Liver Function Tests: No results for input(s): "AST", "ALT", "ALKPHOS", "BILITOT", "PROT", "ALBUMIN" in the last 168 hours. No results for input(s): "LIPASE", "AMYLASE" in the last 168 hours. No results for input(s): "AMMONIA" in the last 168 hours. Coagulation Profile: Recent Labs  Lab 06/04/22 1434  INR 1.1   Cardiac Enzymes: No results for input(s): "CKTOTAL", "CKMB", "CKMBINDEX", "TROPONINI" in the last 168 hours. BNP (last 3 results) No results for input(s): "PROBNP" in the last 8760 hours. HbA1C: No results for input(s): "HGBA1C" in the last 72 hours. CBG: No results for input(s): "GLUCAP" in the last 168 hours. Lipid Profile: No results for input(s): "CHOL", "HDL", "LDLCALC", "TRIG", "CHOLHDL", "LDLDIRECT" in the last 72 hours. Thyroid Function Tests: No results for input(s): "TSH", "T4TOTAL", "FREET4", "T3FREE", "THYROIDAB" in the last 72 hours. Anemia Panel: No results for input(s): "VITAMINB12", "FOLATE", "FERRITIN", "TIBC", "IRON", "RETICCTPCT" in the last 72 hours. Urine analysis:    Component Value Date/Time   COLORURINE YELLOW 02/02/2022 1130   APPEARANCEUR CLEAR 02/02/2022 1130   LABSPEC 1.008 02/02/2022 1130   PHURINE 7.0 02/02/2022 1130   GLUCOSEU NEGATIVE 02/02/2022 1130   HGBUR NEGATIVE 02/02/2022 1130   BILIRUBINUR NEGATIVE 02/02/2022 1130   KETONESUR NEGATIVE 02/02/2022 1130   PROTEINUR NEGATIVE 02/02/2022 1130   UROBILINOGEN 0.2 02/28/2012 2004   NITRITE NEGATIVE 02/02/2022 1130   LEUKOCYTESUR NEGATIVE 02/02/2022 1130     Radiological Exams on Admission: DG Chest Port 1 View  Result Date: 06/04/2022 CLINICAL DATA:  Chest pain and shortness of breath. EXAM: PORTABLE CHEST 1 VIEW COMPARISON:  Chest x-rays dated 05/14/2022 and 01/31/2022. FINDINGS: Heart size and mediastinal contours are stable. Chronic bibasilar atelectasis. No new lung findings. No pleural  effusion or pneumothorax is seen. IMPRESSION: No active disease. Chronic bibasilar atelectasis. Electronically Signed   By: Franki Cabot M.D.   On: 06/04/2022 14:00    EKG: sinus tachy  Assessment/Plan Principal Problem:   Fever   Fever/dyspnea with Acute on chronic respiratory failure -does not appear to be a PNA -rule out other viral infections (already negative for COVID/Flu//RSV) -pro calcitonin negative -does have ? Infection/cellulitis on right vulva-- will treat with vancomycin for now and monitor -will order LE duplex to r/o DVT-- has h/o DVT but not on blood thinner currently-- recently in the hospital, ddimer negative so will hold on CTA for now as less likely -trend labs -lactic acid pending -baseline of 3L per report but unclear from last dispo-- currently on 5-6L so will attempt to wean down  Grade 1 DM CHF-- chronic -BNP normal but patient is obese -will do low dose lasix as d dimer negative and no need for CTA- BP better as well -daily weights -strict I/Os -negative troponins  DM type 2 -SSI -unable to verify home meds with patient  HTN -takes lisinopril and coreg at home -hold for low BP now  Anxiety and depression - Clonazepam 2 mg PO tid -- patient takes scheduled  Dyslipidemia associated with type 2 diabetes mellitus (HCC) - Crestor 10 mg PO daily    Hypothyroisidm  - Synthroid 224 mcg PO daily   H/o substance abuse -check UDS  Obesity Estimated body mass index is 44.85 kg/m as calculated from the following:  Height as of 05/15/22: '5\' 5"'$  (1.651 m).   Weight as of 05/17/22: 122.2 kg.    DVT prophylaxis: lovenox  Code Status: full  Family Communication: she asked for no family to be called Disposition Plan: home when better Consults called: none    Admission status: [facility]   How to contact the [facility] Attending or Consulting provider [place] or covering provider during after hours [place], for this  patient?  Check the care team in [facility] and look for a) attending/consulting [facility] provider listed and b) the [facility] team listed Log into [email or web address] and use [place]'s universal password to access. If you do not have the password, please contact the hospital operator. Locate the [facility] provider you are looking for under [facility] and page to a number that you can be directly reached. If you still have difficulty reaching the provider, please page the [facility] (Director on Call) for the Hospitalists listed on amion for assistance.  06/04/2022, 4:04 PM

## 2022-06-04 NOTE — ED Notes (Signed)
ED TO INPATIENT HANDOFF REPORT  ED Nurse Name and Phone #: [phone]  S Name/Age/Gender Michele Owens 62 y.o. female Room/Bed: 015C/015C  Code Status   Code Status: Full Code  Home/SNF/Other Home Patient oriented to: self, place, time, and situation Is this baseline? Yes   Triage Complete: Triage complete  Chief Complaint Fever [R50.9]  Triage Note Pt BIB [facility] from home d/t Sob and central, non-radiating  CP that has been going on for over 3 days. Pt was here on Sunday for the same, was dc'ed on Thursday. Hx chf. Pt noticed increased edema on her feet. Pt received 324 ASA and 0.4 NTG w EMS. Pt wear 3L O2 at baseline.      Allergies Allergies  Allergen Reactions   Bee Venom Swelling and Other (See Comments)    "All over my body" (swelling)   Fioricet [Butalbital-Apap-Caffeine] Nausea And Vomiting and Rash   Ibuprofen Rash and Other (See Comments)    Severe rash   Lamisil [Terbinafine] Rash and Other (See Comments)    Pt states this causes her to "feel funny"   Nsaids Other (See Comments)    Per MD's orders     Level of Care/Admitting Diagnosis ED Disposition     ED Disposition  Admit   Condition  --   [place]: [facility] [place] [[id number]]  Level of Care: Progressive [102]  Admit to Progressive based on following criteria: MULTISYSTEM THREATS such as stable sepsis, metabolic/electrolyte imbalance with or without encephalopathy that is responding to early treatment.  May admit patient to [facility] or [facility] if equivalent level of care is available:: Yes  Covid Evaluation: Asymptomatic - no recent exposure (last 10 days) testing not required  Diagnosis: Fever [344092]  Admitting Physician: Geradine Girt [place]  Attending Physician: Geradine Girt [id number]  Certification:: I certify this patient will need inpatient services for at least 2 midnights  Estimated Length of Stay: 4          B Medical/Surgery History Past Medical  History:  Diagnosis Date   Anxiety    Arthritis    Asthma    Chronic diastolic CHF (congestive heart failure) ([place])    COPD (chronic obstructive pulmonary disease) ([place])    Uses Oxygen at night   Depression    Diabetes mellitus without complication ([place])    GERD (gastroesophageal reflux disease)    Gout    Headache    migraines   History of DVT of lower extremity    Hypertension    Thyroid cancer ([place]) 09/23/2016   Past Surgical History:  Procedure Laterality Date   BUNIONECTOMY Bilateral    CARDIAC CATHETERIZATION N/A 06/23/2015   Procedure: Right/Left Heart Cath and Coronary Angiography;  Surgeon: [name], MD;  Location: [facility];  Service: Cardiovascular;  Laterality: N/A;   COLONOSCOPY WITH PROPOFOL N/A 12/15/2015   Procedure: COLONOSCOPY WITH PROPOFOL;  Surgeon: [name], MD;  Location: [facility];  Service: Endoscopy;  Laterality: N/A;   RIGHT HEART CATH N/A 10/01/2019   Procedure: RIGHT HEART CATH;  Surgeon: [name], MD;  Location: [facility];  Service: Cardiovascular;  Laterality: N/A;   THYROIDECTOMY  09/19/2016   THYROIDECTOMY N/A 09/19/2016   Procedure: TOTAL THYROIDECTOMY;  Surgeon: [name], MD;  Location: [facility];  Service: General;  Laterality: N/A;   TONSILLECTOMY     TOTAL ABDOMINAL HYSTERECTOMY  07/14/10     A IV Location/Drains/Wounds Patient Lines/Drains/Airways Status  Active Line/Drains/Airways     Name Placement date Placement time Site Days   Peripheral IV 06/04/22 20 G Posterior;Right Hand 06/04/22  --  Hand  less than 1            Intake/Output Last 24 hours No intake or output data in the 24 hours ending 06/04/22 1610  Labs/Imaging Results for orders placed or performed during the hospital encounter of 06/04/22 (from the past 48 hour(s))  Resp panel by RT-PCR (RSV, Flu A&B, Covid) Anterior Nasal Swab     Status: None   Collection Time: 06/04/22  1:06 PM   Specimen: Anterior Nasal Swab  Result  Value Ref Range   SARS Coronavirus 2 by RT PCR NEGATIVE NEGATIVE   Influenza A by PCR NEGATIVE NEGATIVE   Influenza B by PCR NEGATIVE NEGATIVE    Comment: (NOTE) The Xpert Xpress SARS-CoV-2/FLU/RSV plus assay is intended as an aid in the diagnosis of influenza from Nasopharyngeal swab specimens and should not be used as a sole basis for treatment. Nasal washings and aspirates are unacceptable for Xpert Xpress SARS-CoV-2/FLU/RSV testing.  Fact Sheet for Patients: EntrepreneurPulse.com.au  Fact Sheet for Healthcare Providers: IncredibleEmployment.be  This test is not yet approved or cleared by the [place] FDA and has been authorized for detection and/or diagnosis of SARS-CoV-2 by FDA under an Emergency Use Authorization (EUA). This EUA will remain in effect (meaning this test can be used) for the duration of the COVID-19 declaration under Section 564(b)(1) of the Act, 21 U.S.C. section 360bbb-3(b)(1), unless the authorization is terminated or revoked.     Resp Syncytial Virus by PCR NEGATIVE NEGATIVE    Comment: (NOTE) Fact Sheet for Patients: EntrepreneurPulse.com.au  Fact Sheet for Healthcare Providers: IncredibleEmployment.be  This test is not yet approved or cleared by the [place] FDA and has been authorized for detection and/or diagnosis of SARS-CoV-2 by FDA under an Emergency Use Authorization (EUA). This EUA will remain in effect (meaning this test can be used) for the duration of the COVID-19 declaration under Section 564(b)(1) of the Act, 21 U.S.C. section 360bbb-3(b)(1), unless the authorization is terminated or revoked.  Performed at [facility], [place] [address], [place], [place] [id number]   Basic metabolic panel     Status: Abnormal   Collection Time: 06/04/22  1:14 PM  Result Value Ref Range   Sodium 138 135 - 145 mmol/L   Potassium 3.7 3.5 - 5.1 mmol/L   Chloride 99 98  - 111 mmol/L   CO2 30 22 - 32 mmol/L   Glucose, Bld 98 70 - 99 mg/dL    Comment: Glucose reference range applies only to samples taken after fasting for at least 8 hours.   BUN 6 (L) 8 - 23 mg/dL   Creatinine, Ser 0.68 0.44 - 1.00 mg/dL   Calcium 7.8 (L) 8.9 - 10.3 mg/dL   GFR, Estimated >60 >60 mL/min    Comment: (NOTE) Calculated using the CKD-EPI Creatinine Equation (2021)    Anion gap 9 5 - 15    Comment: Performed at [facility] [address], [place] [address]  CBC     Status: Abnormal   Collection Time: 06/04/22  1:14 PM  Result Value Ref Range   WBC 18.9 (H) 4.0 - 10.5 K/uL   RBC 3.52 (L) 3.87 - 5.11 MIL/uL   Hemoglobin 10.8 (L) 12.0 - 15.0 g/dL   HCT 35.0 (L) 36.0 - 46.0 %   MCV 99.4 80.0 - 100.0  fL   MCH 30.7 26.0 - 34.0 pg   MCHC 30.9 30.0 - 36.0 g/dL   RDW 12.9 11.5 - 15.5 %   Platelets 240 150 - 400 K/uL   nRBC 0.0 0.0 - 0.2 %    Comment: Performed at [facility], [place] [address], [place], [place] [address]  Troponin I (High Sensitivity)     Status: None   Collection Time: 06/04/22  1:14 PM  Result Value Ref Range   Troponin I (High Sensitivity) 3 <18 ng/L    Comment: (NOTE) Elevated high sensitivity troponin I (hsTnI) values and significant  changes across serial measurements may suggest ACS but many other  chronic and acute conditions are known to elevate hsTnI results.  Refer to the "Links" section for chest pain algorithms and additional  guidance. Performed at [facility], [place] [address], [place], [place] [address]   Brain natriuretic peptide     Status: None   Collection Time: 06/04/22  1:14 PM  Result Value Ref Range   B Natriuretic Peptide 16.3 0.0 - 100.0 pg/mL    Comment: Performed at [facility] [address], [place], [place] [address]  Protime-INR     Status: None   Collection Time: 06/04/22  2:34 PM  Result Value Ref Range   Prothrombin Time 14.5 11.4 - 15.2 seconds   INR 1.1 0.8 - 1.2    Comment:  (NOTE) INR goal varies based on device and disease states. Performed at [facility], [place] [address] [place], [place] [address]   APTT     Status: None   Collection Time: 06/04/22  2:34 PM  Result Value Ref Range   aPTT 30 24 - 36 seconds    Comment: Performed at [facility] [address] [place], [place] [address]  Procalcitonin     Status: None   Collection Time: 06/04/22  2:34 PM  Result Value Ref Range   Procalcitonin <0.10 ng/mL    Comment:        Interpretation: PCT (Procalcitonin) <= 0.5 ng/mL: Systemic infection (sepsis) is not likely. Local bacterial infection is possible. (NOTE)       Sepsis PCT Algorithm           Lower Respiratory Tract                                      Infection PCT Algorithm    ----------------------------     ----------------------------         PCT < 0.25 ng/mL                PCT < 0.10 ng/mL          Strongly encourage             Strongly discourage   discontinuation of antibiotics    initiation of antibiotics    ----------------------------     -----------------------------       PCT 0.25 - 0.50 ng/mL            PCT 0.10 - 0.25 ng/mL               OR       >80% decrease in PCT            Discourage initiation of  antibiotics      Encourage discontinuation           of antibiotics    ----------------------------     -----------------------------         PCT >= 0.50 ng/mL              PCT 0.26 - 0.50 ng/mL               AND        <80% decrease in PCT             Encourage initiation of                                             antibiotics       Encourage continuation           of antibiotics    ----------------------------     -----------------------------        PCT >= 0.50 ng/mL                  PCT > 0.50 ng/mL               AND         increase in PCT                  Strongly encourage                                      initiation of antibiotics    Strongly encourage  escalation           of antibiotics                                     -----------------------------                                           PCT <= 0.25 ng/mL                                                 OR                                        > 80% decrease in PCT                                      Discontinue / Do not initiate                                             antibiotics  Performed at [facility], [place] [address], [place], [place] [address]    DG Chest Port 1 View  Result Date: 06/04/2022 CLINICAL  DATA:  Chest pain and shortness of breath. EXAM: PORTABLE CHEST 1 VIEW COMPARISON:  Chest x-rays dated 05/14/2022 and 01/31/2022. FINDINGS: Heart size and mediastinal contours are stable. Chronic bibasilar atelectasis. No new lung findings. No pleural effusion or pneumothorax is seen. IMPRESSION: No active disease. Chronic bibasilar atelectasis. Electronically Signed   By: Franki Cabot M.D.   On: 06/04/2022 14:00    Pending Labs Unresulted Labs (From admission, onward)     Start     Ordered   06/11/22 0500  Creatinine, serum  (enoxaparin (LOVENOX)    CrCl >/= 30 ml/min)  Weekly,   R     Comments: while on enoxaparin therapy    06/04/22 1554   06/05/22 [id number]  Basic metabolic panel  Tomorrow morning,   R        06/04/22 1554   06/05/22 0500  CBC  Tomorrow morning,   R        06/04/22 1554   06/04/22 1734  Lactic acid, plasma  (Septic presentation on arrival (screening labs, nursing and treatment orders for obvious sepsis))  STAT Now then every 3 hours,   R      06/04/22 1558   06/04/22 1559  Rapid urine drug screen (hospital performed)  ONCE - STAT,   STAT        06/04/22 1559   06/04/22 1558  D-dimer, quantitative  Add-on,   AD        06/04/22 1557   06/04/22 1514  MRSA Next Gen by PCR, Nasal  (MRSA Screening)  Once,   URGENT        06/04/22 1513   06/04/22 1504  Respiratory (~20 pathogens) panel by PCR  (Respiratory panel by PCR (~20 pathogens, ~24 hr TAT)   w precautions)  Once,   URGENT        06/04/22 1503   06/04/22 1434  Urinalysis, Routine w reflex microscopic -Urine, Clean Catch  Once,   URGENT       Question:  Specimen Source  Answer:  Urine, Clean Catch   06/04/22 1435   06/04/22 1434  Lactic acid, plasma  (Septic presentation on arrival (screening labs, nursing and treatment orders for obvious sepsis))  Now then every 2 hours,   R,   Status:  Canceled      06/04/22 1435   06/04/22 1434  Blood Culture (routine x 2)  (Septic presentation on arrival (screening labs, nursing and treatment orders for obvious sepsis))  BLOOD CULTURE X 2,   STAT      06/04/22 1435            Vitals/Pain Today's Vitals   06/04/22 1430 06/04/22 1449 06/04/22 1452 06/04/22 1530  BP: 109/68   121/67  Pulse: 98 97  99  Resp: (!) 26 19  (!) 27  Temp:   100 F (37.8 C)   TempSrc:   Oral   SpO2: 97% 97%  97%    Isolation Precautions No active isolations  Medications Medications  vancomycin (VANCOREADY) IVPB 1750 mg/350 mL (1,750 mg Intravenous New Bag/Given 06/04/22 1602)  ceFEPIme (MAXIPIME) 2 g in sodium chloride 0.9 % 100 mL IVPB (has no administration in time range)  vancomycin (VANCOREADY) IVPB 750 mg/150 mL (has no administration in time range)  enoxaparin (LOVENOX) injection 40 mg (has no administration in time range)  acetaminophen (TYLENOL) tablet 650 mg (has no administration in time range)    Or  acetaminophen (TYLENOL) suppository 650 mg (has no administration in time range)  ondansetron (ZOFRAN) tablet 4 mg (has no administration in time range)    Or  ondansetron (ZOFRAN) injection 4 mg (has no administration in time range)  insulin aspart (novoLOG) injection 0-15 Units (has no administration in time range)  insulin aspart (novoLOG) injection 0-5 Units (has no administration in time range)  acetaminophen (TYLENOL) tablet 1,000 mg (1,000 mg Oral Given 06/04/22 1321)  ceFEPIme (MAXIPIME) 2 g in sodium chloride 0.9 % 100 mL IVPB (0 g  Intravenous Stopped 06/04/22 1554)    Mobility walks with device     Focused Assessments    R Recommendations: See Admitting Provider Note  Report given to:   Additional Notes:

## 2022-06-05 ENCOUNTER — Inpatient Hospital Stay (HOSPITAL_COMMUNITY): Payer: Medicaid Other

## 2022-06-05 DIAGNOSIS — I5033 Acute on chronic diastolic (congestive) heart failure: Secondary | ICD-10-CM | POA: Diagnosis not present

## 2022-06-05 DIAGNOSIS — M7989 Other specified soft tissue disorders: Secondary | ICD-10-CM

## 2022-06-05 DIAGNOSIS — A419 Sepsis, unspecified organism: Secondary | ICD-10-CM | POA: Diagnosis not present

## 2022-06-05 DIAGNOSIS — R52 Pain, unspecified: Secondary | ICD-10-CM

## 2022-06-05 LAB — RESPIRATORY PANEL BY PCR

## 2022-06-05 LAB — TSH: TSH: 0.407 u[IU]/mL (ref 0.350–4.500)

## 2022-06-05 LAB — BASIC METABOLIC PANEL
Anion gap: 12 (ref 5–15)
BUN: 7 mg/dL — ABNORMAL LOW (ref 8–23)
CO2: 31 mmol/L (ref 22–32)
Calcium: 8.4 mg/dL — ABNORMAL LOW (ref 8.9–10.3)
Chloride: 94 mmol/L — ABNORMAL LOW (ref 98–111)
Creatinine, Ser: 0.71 mg/dL (ref 0.44–1.00)
GFR, Estimated: >60 mL/min (ref >60)
Glucose, Bld: 102 mg/dL — ABNORMAL HIGH (ref 70–99)
Potassium: 3.6 mmol/L (ref 3.5–5.1)
Sodium: 137 mmol/L (ref 135–145)

## 2022-06-05 LAB — URINALYSIS, ROUTINE W REFLEX MICROSCOPIC
Bilirubin Urine: NEGATIVE
Glucose, UA: NEGATIVE mg/dL
Hgb urine dipstick: NEGATIVE
Ketones, ur: NEGATIVE mg/dL
Leukocytes,Ua: NEGATIVE
Nitrite: NEGATIVE
Protein, ur: NEGATIVE mg/dL
Specific Gravity, Urine: 1.013 (ref 1.005–1.030)
pH: 8 (ref 5.0–8.0)

## 2022-06-05 LAB — CBC
HCT: 34.6 % — ABNORMAL LOW (ref 36.0–46.0)
Hemoglobin: 10.6 g/dL — ABNORMAL LOW (ref 12.0–15.0)
MCH: 29.8 pg (ref 26.0–34.0)
MCHC: 30.6 g/dL (ref 30.0–36.0)
MCV: 97.2 fL (ref 80.0–100.0)
Platelets: 261 10*3/uL (ref 150–400)
RBC: 3.56 MIL/uL — ABNORMAL LOW (ref 3.87–5.11)
RDW: 13 % (ref 11.5–15.5)
WBC: 17.3 10*3/uL — ABNORMAL HIGH (ref 4.0–10.5)
nRBC: 0 % (ref 0.0–0.2)

## 2022-06-05 LAB — GLUCOSE, CAPILLARY
Glucose-Capillary: 127 mg/dL — ABNORMAL HIGH (ref 70–99)
Glucose-Capillary: 118 mg/dL — ABNORMAL HIGH (ref 70–99)
Glucose-Capillary: 107 mg/dL — ABNORMAL HIGH (ref 70–99)
Glucose-Capillary: 149 mg/dL — ABNORMAL HIGH (ref 70–99)

## 2022-06-05 LAB — RAPID URINE DRUG SCREEN, HOSP PERFORMED
Amphetamines: NOT DETECTED
Barbiturates: POSITIVE — AB
Benzodiazepines: POSITIVE — AB
Cocaine: NOT DETECTED
Opiates: NOT DETECTED
Tetrahydrocannabinol: NOT DETECTED

## 2022-06-05 LAB — LACTIC ACID, PLASMA: Lactic Acid, Venous: 1.1 mmol/L (ref 0.5–1.9)

## 2022-06-05 LAB — MRSA NEXT GEN BY PCR, NASAL: MRSA by PCR Next Gen: NOT DETECTED

## 2022-06-05 MED ORDER — BENZONATATE 100 MG PO CAPS
100.0000 mg | ORAL_CAPSULE | Freq: Three times a day (TID) | ORAL | Status: DC | PRN
  Administered 2022-06-05 – 2022-06-07 (×3): 100 mg via ORAL
  Filled 2022-06-05 (×3): qty 1

## 2022-06-05 MED ORDER — FUROSEMIDE 10 MG/ML IJ SOLN: 40.0000 mg | Freq: Two times a day (BID) | INTRAMUSCULAR | Status: DC

## 2022-06-05 MED ORDER — LEVOTHYROXINE SODIUM 112 MCG PO TABS
224.0000 ug | ORAL_TABLET | Freq: Every day | ORAL | Status: DC
  Administered 2022-06-06 – 2022-06-07 (×2): 224 ug via ORAL
  Filled 2022-06-05 (×2): qty 2

## 2022-06-05 MED ORDER — ZINC OXIDE 12.8 % EX OINT: TOPICAL_OINTMENT | CUTANEOUS | Status: DC | PRN

## 2022-06-05 MED ORDER — FUROSEMIDE 10 MG/ML IJ SOLN
20.0000 mg | Freq: Two times a day (BID) | INTRAMUSCULAR | Status: DC
  Administered 2022-06-05 – 2022-06-06 (×4): 20 mg via INTRAVENOUS
  Filled 2022-06-05 (×4): qty 2

## 2022-06-05 NOTE — Progress Notes (Addendum)
PROGRESS NOTE    Michele Owens  [id number] DOB: 04-03-1961 DOA: 06/04/2022 PCP: [name], MD    Brief Narrative:  Michele Owens is a 62 y.o. female with medical history significant of  DM2, dCHF, COPD, HTN.  Recently in the hospital from 2/4- 2/8 with acute HFpEF exacerbation w/ acute pulmonary edema.  Patient was sent home with torosemide and ? O2-- ER PA states 3L is her home O2 but documentation from prior hospitalization is not clear with what patient was d/c'd on.   Patient not clear if she had a fever at home but did well initially after discharge but then developed SOB and came to the ER.  Patient does say she has a boil on her labia.  She has not been able to sleep flat but this is normal for her. 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   In the ER, an x ray was done that showed no fluid/pna.  WBC count elevated and vital signs significant for low BP and temp >100.     Labs were ordered but not resulted-- lactic acid still pending. Lasix was ordered but I cancelled due to low BP.  IV abx given by ER PA for possible PNA.       Assessment and Plan: Fever/dyspnea with Acute on chronic respiratory failure -does not appear to be a PNA on x ray -rule out other viral infections (already negative for COVID/Flu//RSV) -pro calcitonin negative -does have ? Infection/cellulitis on right vulva-- will treat with vancomycin for now and monitor -will order LE duplex to r/o DVT-- has h/o DVT but not on blood thinner currently-- recently in the hospital, ddimer negative so will hold on CTA for now as less likely -trend labs -baseline of 3LNC O2  per report but unclear from last dispo-- currently on 5-6L so will attempt to wean down to 3L   Grade 1 DM CHF-- acute on chronic -BNP normal but patient is obese -weight up from 122kg at d/c and now 128kg after 2L diuresis -daily weights -strict I/Os -negative troponins -IV lasix BID   DM type 2 -SSI    HTN -takes lisinopril and coreg at home -hold for low BP now- resume as able   Anxiety and depression - Clonazepam 2 mg PO tid -- patient takes scheduled   Dyslipidemia associated with type 2 diabetes mellitus (HCC) - Crestor 10 mg PO daily    Hypothyroisidm  - Synthroid 224 mcg PO daily  -TSH check   H/o substance abuse -check UDS   Obesity Estimated body mass index is 44.85 kg/m as calculated from the following:   Height as of 05/15/22: '5\' 5"'$  (1.651 m).   Weight as of 05/17/22: 122.2 kg.    Called by RN as patient is saying we are kicking her out and refusing treatment.  Called daughter to get further background as I question patient's understanding of her medical issues.  She refused to answer questions and is difficult to re-direct from her own agenda. I called daughter for some background information and daughter says she has had "mental issues" for over 20 years-- worse since her son died in Aug 14, 2021.   -has been leaving on stove and oven, neighbor calling police on her for being loud and combative.  Daughter states her mother has h/o bipolar/schizophrenia (no diagnosis) in chart.  Daughter also says her mother has been abusing her prescribed medications. Will ask psychiatry to see patient  to help with medications/capacity and diagnosis.   DVT prophylaxis: enoxaparin (LOVENOX) injection 40 mg Start: 06/04/22 1600    Code Status: Full Code Family Communication: asked to not call anyone  Disposition Plan:  Level of care: Progressive Status is: Inpatient Remains inpatient appropriate because: needs IV diuresis    Consultants:  none   Subjective: Asking for a skin cream  Objective: Vitals:   06/05/22 0737 06/05/22 0757 06/05/22 0836 06/05/22 0934  BP: (!) 133/91  121/73   Pulse: 88  85   Resp: (!) 22     Temp:   (!) 97.5 F (36.4 C)   TempSrc:   Oral   SpO2: 92%  92%   Weight:  131.7 kg  128.1 kg    Intake/Output Summary (Last 24 hours) at 06/05/2022  1100 Last data filed at 06/05/2022 0941 Gross per 24 hour  Intake --  Output 2350 ml  Net -2350 ml   Filed Weights   06/05/22 0757 06/05/22 0934  Weight: 131.7 kg 128.1 kg    Examination:   General: Appearance:    Severely obese female in no acute distress     Lungs:    respirations unlabored, diminished  Heart:    Normal heart rate. Normal rhythm. No murmurs, rubs, or gallops.    MS:   All extremities are intact.    Neurologic:   Awake, alert, poor insight       Data Reviewed: I have personally reviewed following labs and imaging studies  CBC: Recent Labs  Lab 06/04/22 1314 06/05/22 0110  WBC 18.9* 17.3*  HGB 10.8* 10.6*  HCT 35.0* 34.6*  MCV 99.4 97.2  PLT 240 0000000   Basic Metabolic Panel: Recent Labs  Lab 06/04/22 1314 06/05/22 0110  NA 138 137  K 3.7 3.6  CL 99 94*  CO2 30 31  GLUCOSE 98 102*  BUN 6* 7*  CREATININE 0.68 0.71  CALCIUM 7.8* 8.4*   GFR: Estimated Creatinine Clearance: 98.3 mL/min (by C-G formula based on SCr of 0.71 mg/dL). Liver Function Tests: No results for input(s): "AST", "ALT", "ALKPHOS", "BILITOT", "PROT", "ALBUMIN" in the last 168 hours. No results for input(s): "LIPASE", "AMYLASE" in the last 168 hours. No results for input(s): "AMMONIA" in the last 168 hours. Coagulation Profile: Recent Labs  Lab 06/04/22 1434  INR 1.1   Cardiac Enzymes: No results for input(s): "CKTOTAL", "CKMB", "CKMBINDEX", "TROPONINI" in the last 168 hours. BNP (last 3 results) No results for input(s): "PROBNP" in the last 8760 hours. HbA1C: No results for input(s): "HGBA1C" in the last 72 hours. CBG: Recent Labs  Lab 06/04/22 1708 06/05/22 0834  GLUCAP 115* 127*   Lipid Profile: No results for input(s): "CHOL", "HDL", "LDLCALC", "TRIG", "CHOLHDL", "LDLDIRECT" in the last 72 hours. Thyroid Function Tests: No results for input(s): "TSH", "T4TOTAL", "FREET4", "T3FREE", "THYROIDAB" in the last 72 hours. Anemia Panel: No results for  input(s): "VITAMINB12", "FOLATE", "FERRITIN", "TIBC", "IRON", "RETICCTPCT" in the last 72 hours. Sepsis Labs: Recent Labs  Lab 06/04/22 1434 06/04/22 1705 06/04/22 2054 06/04/22 2257 06/05/22 0110  PROCALCITON <0.10  --   --   --   --   LATICACIDVEN 1.9 1.2 2.4* 1.1 1.1    Recent Results (from the past 240 hour(s))  Resp panel by RT-PCR (RSV, Flu A&B, Covid) Anterior Nasal Swab     Status: None   Collection Time: 06/04/22  1:06 PM   Specimen: Anterior Nasal Swab  Result Value Ref Range Status   SARS Coronavirus 2 by  RT PCR NEGATIVE NEGATIVE Final   Influenza A by PCR NEGATIVE NEGATIVE Final   Influenza B by PCR NEGATIVE NEGATIVE Final    Comment: (NOTE) The Xpert Xpress SARS-CoV-2/FLU/RSV plus assay is intended as an aid in the diagnosis of influenza from Nasopharyngeal swab specimens and should not be used as a sole basis for treatment. Nasal washings and aspirates are unacceptable for Xpert Xpress SARS-CoV-2/FLU/RSV testing.  Fact Sheet for Patients: EntrepreneurPulse.com.au  Fact Sheet for Healthcare Providers: IncredibleEmployment.be  This test is not yet approved or cleared by the [place] FDA and has been authorized for detection and/or diagnosis of SARS-CoV-2 by FDA under an Emergency Use Authorization (EUA). This EUA will remain in effect (meaning this test can be used) for the duration of the COVID-19 declaration under Section 564(b)(1) of the Act, 21 U.S.C. section 360bbb-3(b)(1), unless the authorization is terminated or revoked.     Resp Syncytial Virus by PCR NEGATIVE NEGATIVE Final    Comment: (NOTE) Fact Sheet for Patients: EntrepreneurPulse.com.au  Fact Sheet for Healthcare Providers: IncredibleEmployment.be  This test is not yet approved or cleared by the [place] FDA and has been authorized for detection and/or diagnosis of SARS-CoV-2 by FDA under an Emergency Use  Authorization (EUA). This EUA will remain in effect (meaning this test can be used) for the duration of the COVID-19 declaration under Section 564(b)(1) of the Act, 21 U.S.C. section 360bbb-3(b)(1), unless the authorization is terminated or revoked.  Performed at [facility], [place] [address], [place], [place] [address]   Blood Culture (routine x 2)     Status: None (Preliminary result)   Collection Time: 06/04/22  3:00 PM   Specimen: BLOOD RIGHT ARM  Result Value Ref Range Status   Specimen Description BLOOD RIGHT ARM  Final   Special Requests   Final    BOTTLES DRAWN AEROBIC AND ANAEROBIC Blood Culture adequate volume   Culture   Final    NO GROWTH < 24 HOURS Performed at [facility], [place] [address], [place], [place] [address]    Report Status PENDING  Incomplete  Blood Culture (routine x 2)     Status: None (Preliminary result)   Collection Time: 06/04/22  8:53 PM   Specimen: BLOOD RIGHT FOREARM  Result Value Ref Range Status   Specimen Description BLOOD RIGHT FOREARM  Final   Special Requests   Final    BOTTLES DRAWN AEROBIC AND ANAEROBIC Blood Culture results may not be optimal due to an excessive volume of blood received in culture bottles   Culture   Final    NO GROWTH < 24 HOURS Performed at [facility], [place] [address] [place], [place] [address]    Report Status PENDING  Incomplete         Radiology Studies: DG Chest Port 1 View  Result Date: 06/04/2022 CLINICAL DATA:  Chest pain and shortness of breath. EXAM: PORTABLE CHEST 1 VIEW COMPARISON:  Chest x-rays dated 05/14/2022 and 01/31/2022. FINDINGS: Heart size and mediastinal contours are stable. Chronic bibasilar atelectasis. No new lung findings. No pleural effusion or pneumothorax is seen. IMPRESSION: No active disease. Chronic bibasilar atelectasis. Electronically Signed   By: Franki Cabot M.D.   On: 06/04/2022 14:00        Scheduled Meds:  busPIRone  5 mg Oral TID   clonazePAM  2 mg  Oral TID   enoxaparin (LOVENOX) injection  40 mg Subcutaneous Q24H   furosemide  20 mg Intravenous BID  insulin aspart  0-15 Units Subcutaneous TID WC   insulin aspart  0-5 Units Subcutaneous QHS   umeclidinium-vilanterol  1 puff Inhalation Daily   Continuous Infusions:  vancomycin 750 mg (06/05/22 0637)     LOS: 1 day    Time spent: 45 minutes spent on chart review, discussion with nursing staff, consultants, updating family and interview/physical exam; more than 50% of that time was spent in counseling and/or coordination of care.    Geradine Girt, DO [facility] Available via Epic secure chat 7am-7pm After these hours, please refer to coverage provider listed on [email or web address] 06/05/2022, 11:00 AM

## 2022-06-05 NOTE — Plan of Care (Signed)

## 2022-06-05 NOTE — Progress Notes (Signed)
VASCULAR LAB    Bilateral lower extremity venous duplex has been performed.  See CV proc for preliminary results.   Ersie Savino, RVT 06/05/2022, 10:31 AM

## 2022-06-06 DIAGNOSIS — A419 Sepsis, unspecified organism: Secondary | ICD-10-CM | POA: Diagnosis not present

## 2022-06-06 DIAGNOSIS — I5033 Acute on chronic diastolic (congestive) heart failure: Secondary | ICD-10-CM | POA: Diagnosis not present

## 2022-06-06 DIAGNOSIS — F320 Major depressive disorder, single episode, mild: Secondary | ICD-10-CM

## 2022-06-06 LAB — CBC
HCT: 35.6 % — ABNORMAL LOW (ref 36.0–46.0)
Hemoglobin: 11.2 g/dL — ABNORMAL LOW (ref 12.0–15.0)
MCH: 29.9 pg (ref 26.0–34.0)
MCHC: 31.5 g/dL (ref 30.0–36.0)
MCV: 95.2 fL (ref 80.0–100.0)
Platelets: 275 10*3/uL (ref 150–400)
RBC: 3.74 MIL/uL — ABNORMAL LOW (ref 3.87–5.11)
RDW: 13 % (ref 11.5–15.5)
WBC: 14.2 10*3/uL — ABNORMAL HIGH (ref 4.0–10.5)
nRBC: 0 % (ref 0.0–0.2)

## 2022-06-06 LAB — BASIC METABOLIC PANEL
Anion gap: 11 (ref 5–15)
BUN: 7 mg/dL — ABNORMAL LOW (ref 8–23)
CO2: 31 mmol/L (ref 22–32)
Calcium: 8.7 mg/dL — ABNORMAL LOW (ref 8.9–10.3)
Chloride: 96 mmol/L — ABNORMAL LOW (ref 98–111)
Creatinine, Ser: 0.78 mg/dL (ref 0.44–1.00)
GFR, Estimated: >60 mL/min (ref >60)
Glucose, Bld: 127 mg/dL — ABNORMAL HIGH (ref 70–99)
Potassium: 3.2 mmol/L — ABNORMAL LOW (ref 3.5–5.1)
Sodium: 138 mmol/L (ref 135–145)

## 2022-06-06 LAB — GLUCOSE, CAPILLARY
Glucose-Capillary: 106 mg/dL — ABNORMAL HIGH (ref 70–99)
Glucose-Capillary: 98 mg/dL (ref 70–99)
Glucose-Capillary: 105 mg/dL — ABNORMAL HIGH (ref 70–99)
Glucose-Capillary: 135 mg/dL — ABNORMAL HIGH (ref 70–99)

## 2022-06-06 MED ORDER — POTASSIUM CHLORIDE CRYS ER 20 MEQ PO TBCR
40.0000 meq | EXTENDED_RELEASE_TABLET | Freq: Once | ORAL | Status: AC
  Administered 2022-06-06: 40 meq via ORAL
  Filled 2022-06-06: qty 2

## 2022-06-06 NOTE — Consult Note (Cosign Needed Addendum)
[place] [facility]iatry Consult   Reason for Consult:  Capacity---daughter says she has schizophrenia, paranoia  Referring Physician:  Eulogio Bear  Patient Identification: Michele Owens MRN:  [id number] Principal Diagnosis: Fever Diagnosis:  Principal Problem:   Fever   Total Time spent with patient: 15 minutes  Subjective:   Michele Owens is a 62 y.o. female was seen and evaluated face-to-face by this provider.  Psychiatric consult was placed due to " capacity--- daughter states she has schizophrenia, paranoia."  Patient observed resting in bed.  Slightly labored breathing noted throughout this assessment.  She is awake, alert and oriented x 3.  Stated she initially refused to take it medication injection yesterday because she was not sure what it was.  States after the nurse explained to her that this is a blood thinner and the importance of with taken this medication she was willing to take the medication. Stated " she told me because I am laying in the bed all day I need this medication to help my blood."  Patient was receptive to taken medications.    Michele Owens does report a history with mental illness.  States she was diagnosed with bipolar disorder.  States she is on multiple medication however she is unable to recall medications she is prescribed.  States she is followed by her general family doctor Dr. Sherilyn Banker at [address]  She denied previous inpatient admissions.  Denied history of self injures behaviors or suicide attempts.  Denied auditory or visual hallucinations.  She is denying illicit drug use or substance abuse currently.  Does reports a history with cocaine, tobacco and marijuana use " many years ago."   She reports she is struggling with multiple stressors and does have a history of major depressive disorder.  States her son was shot and killed on July 11, 2021 [facility] at the [facility] store.  States she has unresolved grief and loss related to his passing.  States she  was supposed to follow-up with therapy and psychiatry but has never kept follow-up appointments.  States she has concerns with [employer].  States she was advised that her daughter can no longer reside in her home and she feels that she needs extra support at nighttime.  She provided verbal authorization to follow-up with her daughter for additional collateral.  NP made multiple attempts to contact [facility] at [phone].  Spoke to patient's daughter Michele Owens. Stated that patient had been refusing care. Michele Owens report patient gets " in her mood" and can be difficult to redirect.  She reports that doesn't have an official diagnosis with schizophrenia or paranoia. states that her mother never makes it to follow-up with therapy or psychiatry appointment  when it's time for her to be diagnosed.  She denies that her mother is prescribed any psychotropic medications.  Denied that she is experience her mother having auditory or visual hallucinations.  She does report that her mother has not been the same since the passing of her son early last year. Denied previous inpatient admission.   Mental Capacity Assessment: I have evaluated the following areas to assess the Michele Owens's mental capacity regarding medical decision-making ability which pertains to competency to accept or refuse medical treatment.   The specific treatment or service in question is: She reported that she had refused to take Lovenox injection on 06/05/2022 however after further explanation with the medication is used for.  Patient was receptive to taking medication. Documented that patient as since taken and tolerated medications.   Communication:  The patient was able to clearly state preferred treatment options for her medical care at this time. The patient was able to decide that she does need daily injection.    Understanding: The patient was able to recall information, link causal relationships, and process general probabilities  regarding life situations and medical treatment scenarios. She was able to paraphrase her view of the current situation and her thoughts about it including future-oriented scenarios. The patient did not present with impairments in memory, attention span, or intelligence.   Appreciation: The patient was able to identify and describe her various illnesses and treatment options with potential outcomes. The patient did not present with concerns such as denial or delusional thought-process.   Rationalization: The patient was able to weigh risks and benefits and come to a conclusion congruent with patient's perceived goals..   Conclusion: At this time, there is insufficient evidence to warrant removal of the patient's rights for medical decision-making. We can clearly determine mental capacity for decision-making. Marland Kitchen    HPI: Per initial admission assessment note: "Michele Owens is a 62 y.o. female with past medical history hypertension, hyperlipidemia, anemia, chronic pain, HFpEF presents to the ED complaining of chest pressure, cough, shortness of breath, and increased lower extremity edema for the last 2 days.  Patient reports that she was hospitalized last week and discharged 3 days ago.  She reports that this was at [facility], however, most recent admission we have in our system shows that she was discharged on 2/8 instead.  Patient states that she has been compliant with her home medications.  She arrives to the ED with a fever of 101.2 Fahrenheit. .."  Past Psychiatric History: Depression, Bipolar disorder   Risk to Self:   Risk to Others:   Prior Inpatient Therapy:   Prior Outpatient Therapy:    Past Medical History:  Past Medical History:  Diagnosis Date   Anxiety    Arthritis    Asthma    Chronic diastolic CHF (congestive heart failure) (HCC)    COPD (chronic obstructive pulmonary disease) (HCC)    Uses Oxygen at night   Depression    Diabetes mellitus without complication (HCC)     GERD (gastroesophageal reflux disease)    Gout    Headache    migraines   History of DVT of lower extremity    Hypertension    Thyroid cancer ([place]) 09/23/2016    Past Surgical History:  Procedure Laterality Date   BUNIONECTOMY Bilateral    CARDIAC CATHETERIZATION N/A 06/23/2015   Procedure: Right/Left Heart Cath and Coronary Angiography;  Surgeon: [name], MD;  Location: [facility];  Service: Cardiovascular;  Laterality: N/A;   COLONOSCOPY WITH PROPOFOL N/A 12/15/2015   Procedure: COLONOSCOPY WITH PROPOFOL;  Surgeon: [name], MD;  Location: [facility];  Service: Endoscopy;  Laterality: N/A;   RIGHT HEART CATH N/A 10/01/2019   Procedure: RIGHT HEART CATH;  Surgeon: [name], MD;  Location: [facility];  Service: Cardiovascular;  Laterality: N/A;   THYROIDECTOMY  09/19/2016   THYROIDECTOMY N/A 09/19/2016   Procedure: TOTAL THYROIDECTOMY;  Surgeon: [name], MD;  Location: [facility];  Service: General;  Laterality: N/A;   TONSILLECTOMY     TOTAL ABDOMINAL HYSTERECTOMY  07/14/10   Family History:  Family History  Problem Relation Age of Onset   Cancer Father        thought to be due to exposure to concrete   Diabetes Mother    Thyroid  cancer Mother        dx in her 47s-60s   Cancer Maternal Uncle        2 uncles with cancer NOS   Brain cancer Paternal Aunt    Cancer Cousin        maternal first cousin - NOS   Cancer Cousin        maternal first cousin - NOS   Family Psychiatric  History:  Social History:  Social History   Substance and Sexual Activity  Alcohol Use Not Currently   Comment: quit 12 years ago     Social History   Substance and Sexual Activity  Drug Use Not Currently   Types: Marijuana, Cocaine   Comment: quit 12 years ago    Social History   Socioeconomic History   Marital status: Single    Spouse name: Not on file   Number of children: Not on file   Years of education: Not on file   Highest education level: Not on  file  Occupational History   Occupation: disabled  Tobacco Use   Smoking status: Former    Packs/day: 0.50    Years: 41.00    Total pack years: 20.50    Types: Cigarettes    Quit date: 07/2016    Years since quitting: 5.9   Smokeless tobacco: Never  Vaping Use   Vaping Use: Never used  Substance and Sexual Activity   Alcohol use: Not Currently    Comment: quit 12 years ago   Drug use: Not Currently    Types: Marijuana, Cocaine    Comment: quit 12 years ago   Sexual activity: Yes  Other Topics Concern   Not on file  Social History Narrative   Not on file   Social Determinants of Health   Financial Resource Strain: Low Risk  (02/01/2021)   Overall Financial Resource Strain (CARDIA)    Difficulty of Paying Living Expenses: Not very hard  Food Insecurity: No Food Insecurity (05/15/2022)   Hunger Vital Sign    Worried About Running Out of Food in the Last Year: Never true    Ran Out of Food in the Last Year: Never true  Transportation Needs: No Transportation Needs (05/15/2022)   PRAPARE - Hydrologist (Medical): No    Lack of Transportation (Non-Medical): No  Physical Activity: Inactive (02/01/2021)   Exercise Vital Sign    Days of Exercise per Week: 0 days    Minutes of Exercise per Session: 0 min  Stress: Stress Concern Present (02/23/2021)   [place]    Feeling of Stress : To some extent  Social Connections: Moderately Isolated (01/27/2021)   Social Connection and Isolation Panel [NHANES]    Frequency of Communication with Friends and Family: More than three times a week    Frequency of Social Gatherings with Friends and Family: More than three times a week    Attends Religious Services: 1 to 4 times per year    Active Member of [employer] or Organizations: No    Attends Archivist Meetings: Never    Marital Status: Divorced   Additional Social History:     Allergies:   Allergies  Allergen Reactions   Bee Venom Swelling and Other (See Comments)    "All over my body" (swelling)   Fioricet [Butalbital-Apap-Caffeine] Nausea And Vomiting and Rash   Ibuprofen Rash and Other (See Comments)    Severe rash  Lamisil [Terbinafine] Rash and Other (See Comments)    Pt states this causes her to "feel funny"   Nsaids Other (See Comments)    Per MD's orders     Labs:  Results for orders placed or performed during the hospital encounter of 06/04/22 (from the past 48 hour(s))  Resp panel by RT-PCR (RSV, Flu A&B, Covid) Anterior Nasal Swab     Status: None   Collection Time: 06/04/22  1:06 PM   Specimen: Anterior Nasal Swab  Result Value Ref Range   SARS Coronavirus 2 by RT PCR NEGATIVE NEGATIVE   Influenza A by PCR NEGATIVE NEGATIVE   Influenza B by PCR NEGATIVE NEGATIVE    Comment: (NOTE) The Xpert Xpress SARS-CoV-2/FLU/RSV plus assay is intended as an aid in the diagnosis of influenza from Nasopharyngeal swab specimens and should not be used as a sole basis for treatment. Nasal washings and aspirates are unacceptable for Xpert Xpress SARS-CoV-2/FLU/RSV testing.  Fact Sheet for Patients: EntrepreneurPulse.com.au  Fact Sheet for Healthcare Providers: IncredibleEmployment.be  This test is not yet approved or cleared by the [place] FDA and has been authorized for detection and/or diagnosis of SARS-CoV-2 by FDA under an Emergency Use Authorization (EUA). This EUA will remain in effect (meaning this test can be used) for the duration of the COVID-19 declaration under Section 564(b)(1) of the Act, 21 U.S.C. section 360bbb-3(b)(1), unless the authorization is terminated or revoked.     Resp Syncytial Virus by PCR NEGATIVE NEGATIVE    Comment: (NOTE) Fact Sheet for Patients: EntrepreneurPulse.com.au  Fact Sheet for Healthcare  Providers: IncredibleEmployment.be  This test is not yet approved or cleared by the [place] FDA and has been authorized for detection and/or diagnosis of SARS-CoV-2 by FDA under an Emergency Use Authorization (EUA). This EUA will remain in effect (meaning this test can be used) for the duration of the COVID-19 declaration under Section 564(b)(1) of the Act, 21 U.S.C. section 360bbb-3(b)(1), unless the authorization is terminated or revoked.  Performed at [facility], [place] [address], [place], [place] [address]   Respiratory (~20 pathogens) panel by PCR     Status: None   Collection Time: 06/04/22  1:06 PM   Specimen: Nasopharyngeal Swab; Respiratory  Result Value Ref Range   Adenovirus NOT DETECTED NOT DETECTED   Coronavirus 229E NOT DETECTED NOT DETECTED    Comment: (NOTE) The Coronavirus on the Respiratory Panel, DOES NOT test for the novel  Coronavirus (2019 nCoV)    Coronavirus HKU1 NOT DETECTED NOT DETECTED   Coronavirus NL63 NOT DETECTED NOT DETECTED   Coronavirus OC43 NOT DETECTED NOT DETECTED   Metapneumovirus NOT DETECTED NOT DETECTED   Rhinovirus / Enterovirus NOT DETECTED NOT DETECTED   Influenza A NOT DETECTED NOT DETECTED   Influenza B NOT DETECTED NOT DETECTED   Parainfluenza Virus 1 NOT DETECTED NOT DETECTED   Parainfluenza Virus 2 NOT DETECTED NOT DETECTED   Parainfluenza Virus 3 NOT DETECTED NOT DETECTED   Parainfluenza Virus 4 NOT DETECTED NOT DETECTED   Respiratory Syncytial Virus NOT DETECTED NOT DETECTED   Bordetella pertussis NOT DETECTED NOT DETECTED   Bordetella Parapertussis NOT DETECTED NOT DETECTED   Chlamydophila pneumoniae NOT DETECTED NOT DETECTED   Mycoplasma pneumoniae NOT DETECTED NOT DETECTED    Comment: Performed at [facility], [place] [address], [place], [place] [id number]  Basic metabolic panel     Status: Abnormal   Collection Time: 06/04/22  1:14 PM  Result Value Ref Range  Sodium 138 135 - 145  mmol/L   Potassium 3.7 3.5 - 5.1 mmol/L   Chloride 99 98 - 111 mmol/L   CO2 30 22 - 32 mmol/L   Glucose, Bld 98 70 - 99 mg/dL    Comment: Glucose reference range applies only to samples taken after fasting for at least 8 hours.   BUN 6 (L) 8 - 23 mg/dL   Creatinine, Ser 0.68 0.44 - 1.00 mg/dL   Calcium 7.8 (L) 8.9 - 10.3 mg/dL   GFR, Estimated >60 >60 mL/min    Comment: (NOTE) Calculated using the CKD-EPI Creatinine Equation (2021)    Anion gap 9 5 - 15    Comment: Performed at [facility] [address], [place], [place] [address]  CBC     Status: Abnormal   Collection Time: 06/04/22  1:14 PM  Result Value Ref Range   WBC 18.9 (H) 4.0 - 10.5 K/uL   RBC 3.52 (L) 3.87 - 5.11 MIL/uL   Hemoglobin 10.8 (L) 12.0 - 15.0 g/dL   HCT 35.0 (L) 36.0 - 46.0 %   MCV 99.4 80.0 - 100.0 fL   MCH 30.7 26.0 - 34.0 pg   MCHC 30.9 30.0 - 36.0 g/dL   RDW 12.9 11.5 - 15.5 %   Platelets 240 150 - 400 K/uL   nRBC 0.0 0.0 - 0.2 %    Comment: Performed at [facility], [place] [address], [place], [place] [address]  Troponin I (High Sensitivity)     Status: None   Collection Time: 06/04/22  1:14 PM  Result Value Ref Range   Troponin I (High Sensitivity) 3 <18 ng/L    Comment: (NOTE) Elevated high sensitivity troponin I (hsTnI) values and significant  changes across serial measurements may suggest ACS but many other  chronic and acute conditions are known to elevate hsTnI results.  Refer to the "Links" section for chest pain algorithms and additional  guidance. Performed at [facility], [place] [address] [place], [place] [address]   Brain natriuretic peptide     Status: None   Collection Time: 06/04/22  1:14 PM  Result Value Ref Range   B Natriuretic Peptide 16.3 0.0 - 100.0 pg/mL    Comment: Performed at [facility] [address] [place], [place] [address]  Lactic acid, plasma     Status: None   Collection Time: 06/04/22  2:34 PM  Result Value Ref Range   Lactic Acid,  Venous 1.9 0.5 - 1.9 mmol/L    Comment: Performed at [facility] [address] [place], [place] [address]  Protime-INR     Status: None   Collection Time: 06/04/22  2:34 PM  Result Value Ref Range   Prothrombin Time 14.5 11.4 - 15.2 seconds   INR 1.1 0.8 - 1.2    Comment: (NOTE) INR goal varies based on device and disease states. Performed at [facility], [place] [address], [place], [place] [address]   APTT     Status: None   Collection Time: 06/04/22  2:34 PM  Result Value Ref Range   aPTT 30 24 - 36 seconds    Comment: Performed at [facility] [address], [place], [place] [address]  Procalcitonin     Status: None   Collection Time: 06/04/22  2:34 PM  Result Value Ref Range   Procalcitonin <0.10 ng/mL    Comment:        Interpretation:  PCT (Procalcitonin) <= 0.5 ng/mL: Systemic infection (sepsis) is not likely. Local bacterial infection is possible. (NOTE)       Sepsis PCT Algorithm           Lower Respiratory Tract                                      Infection PCT Algorithm    ----------------------------     ----------------------------         PCT < 0.25 ng/mL                PCT < 0.10 ng/mL          Strongly encourage             Strongly discourage   discontinuation of antibiotics    initiation of antibiotics    ----------------------------     -----------------------------       PCT 0.25 - 0.50 ng/mL            PCT 0.10 - 0.25 ng/mL               OR       >80% decrease in PCT            Discourage initiation of                                            antibiotics      Encourage discontinuation           of antibiotics    ----------------------------     -----------------------------         PCT >= 0.50 ng/mL              PCT 0.26 - 0.50 ng/mL               AND        <80% decrease in PCT             Encourage initiation of                                             antibiotics       Encourage continuation           of antibiotics     ----------------------------     -----------------------------        PCT >= 0.50 ng/mL                  PCT > 0.50 ng/mL               AND         increase in PCT                  Strongly encourage                                      initiation of antibiotics    Strongly encourage escalation           of antibiotics                                     -----------------------------  PCT <= 0.25 ng/mL                                                 OR                                        > 80% decrease in PCT                                      Discontinue / Do not initiate                                             antibiotics  Performed at [facility], [place] [address], [place], [place] [address]   Blood Culture (routine x 2)     Status: None (Preliminary result)   Collection Time: 06/04/22  3:00 PM   Specimen: BLOOD RIGHT ARM  Result Value Ref Range   Specimen Description BLOOD RIGHT ARM    Special Requests      BOTTLES DRAWN AEROBIC AND ANAEROBIC Blood Culture adequate volume   Culture      NO GROWTH 2 DAYS Performed at [facility], [place] [address] [place], [place] [address]    Report Status PENDING   D-dimer, quantitative     Status: None   Collection Time: 06/04/22  3:12 PM  Result Value Ref Range   D-Dimer, Quant 0.31 0.00 - 0.50 ug/mL-FEU    Comment: (NOTE) At the manufacturer cut-off value of 0.5 g/mL FEU, this assay has a negative predictive value of 95-100%.This assay is intended for use in conjunction with a clinical pretest probability (PTP) assessment model to exclude pulmonary embolism (PE) and deep venous thrombosis (DVT) in outpatients suspected of PE or DVT. Results should be correlated with clinical presentation. Performed at [facility], [place] [address], [place], [place] [address]   Troponin I (High Sensitivity)     Status: None   Collection Time: 06/04/22  3:14 PM  Result Value Ref  Range   Troponin I (High Sensitivity) 5 <18 ng/L    Comment: (NOTE) Elevated high sensitivity troponin I (hsTnI) values and significant  changes across serial measurements may suggest ACS but many other  chronic and acute conditions are known to elevate hsTnI results.  Refer to the "Links" section for chest pain algorithms and additional  guidance. Performed at [facility], [place] [address] [place], [place] [address]   Lactic acid, plasma     Status: None   Collection Time: 06/04/22  5:05 PM  Result Value Ref Range   Lactic Acid, Venous 1.2 0.5 - 1.9 mmol/L    Comment: Performed at [facility] [address] [place], [place] [address]  CBG monitoring, ED     Status: Abnormal   Collection Time: 06/04/22  5:08 PM  Result Value Ref Range   Glucose-Capillary 115 (H) 70 - 99 mg/dL    Comment: Glucose reference range applies only to samples taken after fasting for at least 8 hours.  Blood Culture (routine  x 2)     Status: None (Preliminary result)   Collection Time: 06/04/22  8:53 PM   Specimen: BLOOD RIGHT FOREARM  Result Value Ref Range   Specimen Description BLOOD RIGHT FOREARM    Special Requests      BOTTLES DRAWN AEROBIC AND ANAEROBIC Blood Culture results may not be optimal due to an excessive volume of blood received in culture bottles   Culture      NO GROWTH 2 DAYS Performed at [facility], [place] [address], [place], [place] [address]    Report Status PENDING   Lactic acid, plasma     Status: Abnormal   Collection Time: 06/04/22  8:54 PM  Result Value Ref Range   Lactic Acid, Venous 2.4 (HH) 0.5 - 1.9 mmol/L    Comment: CRITICAL RESULT CALLED TO, READ BACK BY AND VERIFIED WITH K. WEBB,RN. [id number] 06/04/22. LPAIT Performed at [facility], [place] [address] [place], [place] [address]   Lactic acid, plasma     Status: None   Collection Time: 06/04/22 10:57 PM  Result Value Ref Range   Lactic Acid, Venous 1.1 0.5 - 1.9 mmol/L    Comment: Performed at  [facility] [address], [place], [place] [id number]  Basic metabolic panel     Status: Abnormal   Collection Time: 06/05/22  1:10 AM  Result Value Ref Range   Sodium 137 135 - 145 mmol/L   Potassium 3.6 3.5 - 5.1 mmol/L   Chloride 94 (L) 98 - 111 mmol/L   CO2 31 22 - 32 mmol/L   Glucose, Bld 102 (H) 70 - 99 mg/dL    Comment: Glucose reference range applies only to samples taken after fasting for at least 8 hours.   BUN 7 (L) 8 - 23 mg/dL   Creatinine, Ser 0.71 0.44 - 1.00 mg/dL   Calcium 8.4 (L) 8.9 - 10.3 mg/dL   GFR, Estimated >60 >60 mL/min    Comment: (NOTE) Calculated using the CKD-EPI Creatinine Equation (2021)    Anion gap 12 5 - 15    Comment: Performed at [facility] [address] [place], [place] [address]  CBC     Status: Abnormal   Collection Time: 06/05/22  1:10 AM  Result Value Ref Range   WBC 17.3 (H) 4.0 - 10.5 K/uL   RBC 3.56 (L) 3.87 - 5.11 MIL/uL   Hemoglobin 10.6 (L) 12.0 - 15.0 g/dL   HCT 34.6 (L) 36.0 - 46.0 %   MCV 97.2 80.0 - 100.0 fL   MCH 29.8 26.0 - 34.0 pg   MCHC 30.6 30.0 - 36.0 g/dL   RDW 13.0 11.5 - 15.5 %   Platelets 261 150 - 400 K/uL   nRBC 0.0 0.0 - 0.2 %    Comment: Performed at [facility], [place] [address] [place], [place] [address]  Lactic acid, plasma     Status: None   Collection Time: 06/05/22  1:10 AM  Result Value Ref Range   Lactic Acid, Venous 1.1 0.5 - 1.9 mmol/L    Comment: Performed at [facility] [address] [place], [place] [address]  TSH     Status: None   Collection Time: 06/05/22  1:10 AM  Result Value Ref Range   TSH 0.407 0.350 - 4.500 uIU/mL    Comment: Performed by a 3rd Generation assay with a functional sensitivity of <=0.01 uIU/mL. Performed at [facility], [address] [place], [place] [address]   Glucose, capillary     Status: Abnormal   Collection Time: 06/05/22  8:34 AM  Result Value Ref Range   Glucose-Capillary 127 (H) 70 - 99 mg/dL    Comment: Glucose  reference range applies only to samples taken after fasting for at least 8 hours.  Urinalysis, Routine w reflex microscopic -Urine, Clean Catch     Status: None   Collection Time: 06/05/22  9:44 AM  Result Value Ref Range   Color, Urine YELLOW YELLOW   APPearance CLEAR CLEAR   Specific Gravity, Urine 1.013 1.005 - 1.030   pH 8.0 5.0 - 8.0   Glucose, UA NEGATIVE NEGATIVE mg/dL   Hgb urine dipstick NEGATIVE NEGATIVE   Bilirubin Urine NEGATIVE NEGATIVE   Ketones, ur NEGATIVE NEGATIVE mg/dL   Protein, ur NEGATIVE NEGATIVE mg/dL   Nitrite NEGATIVE NEGATIVE   Leukocytes,Ua NEGATIVE NEGATIVE    Comment: Performed at [facility] [address] [place], [place] [address]  Rapid urine drug screen (hospital performed)     Status: Abnormal   Collection Time: 06/05/22  9:44 AM  Result Value Ref Range   Opiates NONE DETECTED NONE DETECTED   Cocaine NONE DETECTED NONE DETECTED   Benzodiazepines POSITIVE (A) NONE DETECTED   Amphetamines NONE DETECTED NONE DETECTED   Tetrahydrocannabinol NONE DETECTED NONE DETECTED   Barbiturates POSITIVE (A) NONE DETECTED    Comment: (NOTE) DRUG SCREEN FOR MEDICAL PURPOSES ONLY.  IF CONFIRMATION IS NEEDED FOR ANY PURPOSE, NOTIFY LAB WITHIN 5 DAYS.  LOWEST DETECTABLE LIMITS FOR URINE DRUG SCREEN Drug Class                     Cutoff (ng/mL) Amphetamine and metabolites    1000 Barbiturate and metabolites    200 Benzodiazepine                 200 Opiates and metabolites        300 Cocaine and metabolites        300 THC                            50 Performed at [facility], [place] [address], [place], [place] [address]   Glucose, capillary     Status: Abnormal   Collection Time: 06/05/22 12:17 PM  Result Value Ref Range   Glucose-Capillary 118 (H) 70 - 99 mg/dL    Comment: Glucose reference range applies only to samples taken after fasting for at least 8 hours.  Glucose, capillary     Status: Abnormal   Collection Time: 06/05/22  5:15 PM   Result Value Ref Range   Glucose-Capillary 107 (H) 70 - 99 mg/dL    Comment: Glucose reference range applies only to samples taken after fasting for at least 8 hours.  Glucose, capillary     Status: Abnormal   Collection Time: 06/05/22 11:23 PM  Result Value Ref Range   Glucose-Capillary 149 (H) 70 - 99 mg/dL    Comment: Glucose reference range applies only to samples taken after fasting for at least 8 hours.  Glucose, capillary     Status: Abnormal   Collection Time: 06/06/22  8:01 AM  Result Value Ref Range   Glucose-Capillary 106 (H) 70 - 99 mg/dL    Comment: Glucose reference range applies only to samples taken after fasting for at least 8 hours.  Glucose, capillary  Status: None   Collection Time: 06/06/22 11:54 AM  Result Value Ref Range   Glucose-Capillary 98 70 - 99 mg/dL    Comment: Glucose reference range applies only to samples taken after fasting for at least 8 hours.    Current Facility-Administered Medications  Medication Dose Route Frequency Provider Last Rate Last Admin   acetaminophen (TYLENOL) tablet 650 mg  650 mg Oral Q6H PRN Geradine Girt, DO   650 mg at 06/06/22 [id number]   Or   acetaminophen (TYLENOL) suppository 650 mg  650 mg Rectal Q6H PRN Geradine Girt, DO       benzonatate (TESSALON) capsule 100 mg  100 mg Oral TID PRN Eulogio Bear U, DO   100 mg at 06/06/22 1229   busPIRone (BUSPAR) tablet 5 mg  5 mg Oral TID Eulogio Bear U, DO   5 mg at 06/06/22 [id number]   clonazePAM (KLONOPIN) tablet 2 mg  2 mg Oral TID Eulogio Bear U, DO   2 mg at 06/06/22 [id number]   enoxaparin (LOVENOX) injection 40 mg  40 mg Subcutaneous Q24H Vann, Jessica U, DO   40 mg at 06/05/22 1700   furosemide (LASIX) injection 20 mg  20 mg Intravenous BID Vann, Jessica U, DO   20 mg at 06/06/22 [id number]   insulin aspart (novoLOG) injection 0-15 Units  0-15 Units Subcutaneous TID WC Vann, Jessica U, DO       insulin aspart (novoLOG) injection 0-5 Units  0-5 Units Subcutaneous QHS Vann, Jessica U, DO        levothyroxine (SYNTHROID) tablet 224 mcg  224 mcg Oral Q0600 Eulogio Bear U, DO   224 mcg at 06/06/22 0501   ondansetron (ZOFRAN) tablet 4 mg  4 mg Oral Q6H PRN Eulogio Bear U, DO       Or   ondansetron (ZOFRAN) injection 4 mg  4 mg Intravenous Q6H PRN Eulogio Bear U, DO       oxyCODONE-acetaminophen (PERCOCET/ROXICET) 5-325 MG per tablet 1 tablet  1 tablet Oral Q6H PRN Eulogio Bear U, DO   1 tablet at 06/06/22 1229   And   oxyCODONE (Oxy IR/ROXICODONE) immediate release tablet 5 mg  5 mg Oral Q6H PRN Eulogio Bear U, DO   5 mg at 06/06/22 0501   umeclidinium-vilanterol (ANORO ELLIPTA) 62.5-25 MCG/ACT 1 puff  1 puff Inhalation Daily Eulogio Bear U, DO   1 puff at 06/06/22 0751   vancomycin (VANCOREADY) IVPB 750 mg/150 mL  750 mg Intravenous Q12H Geradine Girt, DO   Stopped at 06/06/22 0605   Zinc Oxide (TRIPLE PASTE) 12.8 % ointment   Topical PRN Geradine Girt, DO        Musculoskeletal:    Psychiatric Specialty Exam:  Presentation  General Appearance: Appropriate for Environment  Eye Contact:Good  Speech:Clear and Coherent  Speech Volume:Normal  Handedness:Right   Mood and Affect  Mood:Depressed  Affect:Congruent   Thought Process  Thought Processes:Coherent  Descriptions of Associations:Intact  Orientation:Full (Time, Place and Person)  Thought Content:Logical  History of Schizophrenia/Schizoaffective disorder:No data recorded Duration of Psychotic Symptoms:No data recorded Hallucinations:Hallucinations: None  Ideas of Reference:None  Suicidal Thoughts:Suicidal Thoughts: No  Homicidal Thoughts:Homicidal Thoughts: No   Sensorium  Memory:Immediate Good; Recent Good; Remote Good  Judgment:Fair  Insight:No data recorded  Executive Functions  Concentration:Good  Attention Span:Good  [place] of Knowledge:Good  Language:Good   Psychomotor Activity  Psychomotor Activity:Psychomotor Activity: Normal   Assets   Assets:Desire for Improvement   Sleep  Sleep:Sleep: Fair   Physical Exam: Physical Exam Vitals and nursing note reviewed.  Neurological:     Mental Status: She is alert.    Review of Systems  Psychiatric/Behavioral:  Positive for depression. Negative for suicidal ideas. The patient is nervous/anxious.   All other systems reviewed and are negative.  Blood pressure 131/75, pulse 88, temperature 97.9 F (36.6 C), temperature source Oral, resp. rate 20, weight 124.2 kg, SpO2 100 %. Body mass index is 45.56 kg/m.  Treatment Plan Summary: Daily contact with patient to assess and evaluate symptoms and progress in treatment and Medication management - continue trazodone 50 mg daily -continue Buspar '5mg'$  TID   Disposition: No evidence of imminent risk to self or others at present.   Patient does not meet criteria for psychiatric inpatient admission. Supportive therapy provided about ongoing stressors. Refer to IOP. Discussed crisis plan, support from social network, calling 911, coming to the Emergency Department, and calling Suicide Hotline.  Derrill [place], NP 06/06/2022 12:46 PM

## 2022-06-06 NOTE — Progress Notes (Signed)
PROGRESS NOTE    Michele Owens  [id number] DOB: May 22, 1960 DOA: 06/04/2022 PCP: [name], MD    Brief Narrative:  Michele Owens is a 62 y.o. female with medical history significant of  DM2, dCHF, COPD, HTN.  Recently in the hospital from 2/4- 2/8 with acute HFpEF exacerbation w/ acute pulmonary edema.  Patient was sent home with torosemide and ? O2-- ER PA states 3L is her home O2 but documentation from prior hospitalization is not clear with what patient was d/c'd on.   Patient not clear if she had a fever at home but did well initially after discharge but then developed SOB and came to the ER.  Patient does say she has a boil on her labia.  She has not been able to sleep flat but this is normal for her. 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   In the ER, an x ray was done that showed no fluid/pna.  WBC count elevated and vital signs significant for low BP and temp >100.     Labs were ordered but not resulted-- lactic acid still pending. Lasix was ordered but I cancelled due to low BP.  IV abx given by ER PA for possible PNA.       Assessment and Plan: Fever/dyspnea with Acute on chronic respiratory failure -does not appear to be a PNA on x ray -rule out other viral infections (already negative for COVID/Flu//RSV) -pro calcitonin negative -does have ? Infection/cellulitis on right vulva-- given abx and will narrow in the AM to PO abx to finish course as she has improved -DVT negative, d dimer negative so will not get CTA -trend labs -baseline of 3LNC O2 - wean down   Grade 1 DM CHF-- acute on chronic -BNP normal but patient is obese -weight up from 122kg at d/c-- came in around 290 now 124kg -down 3L -daily weights -strict I/Os -negative troponins -IV lasix BID- labs pending for today   DM type 2 -SSI   HTN -takes lisinopril and coreg at home -hold for low BP now- resume as able   Anxiety and depression - Clonazepam 2 mg PO tid --  patient takes scheduled-- prescribed by PCP- Garba   Dyslipidemia associated with type 2 diabetes mellitus (HCC) - Crestor 10 mg PO daily    Hypothyroisidm  - Synthroid 224 mcg PO daily  -TSH normal   H/o substance abuse -UDS done   Obesity Estimated body mass index is 44.85 kg/m as calculated from the following:   Height as of 05/15/22: 5' 5"$  (1.651 m).   Weight as of 05/17/22: 122.2 kg.    PT eval  Psych consulted as patient was expressing paranoid behavior/belligerent but much improved today. Family with concerns around mental health    DVT prophylaxis: enoxaparin (LOVENOX) injection 40 mg Start: 06/04/22 1600    Code Status: Full Code Family Communication: on the phone with daughter  Disposition Plan:  Level of care: Telemetry Medical Status is: Inpatient Remains inpatient appropriate because: needs IV diuresis/PT eval and psych consult    Consultants:  psych   Subjective: More agreeable and cooperative today Would like to go home-- daughter has concerns about her being on her own  Objective: Vitals:   06/05/22 1933 06/05/22 2300 06/06/22 0355 06/06/22 0751  BP: (!) 147/77 (!) 109/97 (!) 144/82 131/75  Pulse: 92 77 88 88  Resp: 15 17 18 20  $ Temp: 98.7 F (37.1 C) 98.3 F (  36.8 C) 99 F (37.2 C) 97.9 F (36.6 C)  TempSrc: Oral Oral Oral Oral  SpO2: 93% 96% 90% 100%  Weight:        Intake/Output Summary (Last 24 hours) at 06/06/2022 1228 Last data filed at 06/06/2022 1200 Gross per 24 hour  Intake 1169.9 ml  Output 2100 ml  Net -930.1 ml   Filed Weights   06/05/22 0757 06/05/22 0934  Weight: 131.7 kg 128.1 kg    Examination:    General: Appearance:    Severely obese female in no acute distress     Lungs:      respirations unlabored on 3L  Heart:    Normal heart rate.    MS:   All extremities are intact.   Neurologic:   Awake, alert      Data Reviewed: I have personally reviewed following labs and imaging studies  CBC: Recent Labs   Lab 06/04/22 1314 06/05/22 0110  WBC 18.9* 17.3*  HGB 10.8* 10.6*  HCT 35.0* 34.6*  MCV 99.4 97.2  PLT 240 0000000   Basic Metabolic Panel: Recent Labs  Lab 06/04/22 1314 06/05/22 0110  NA 138 137  K 3.7 3.6  CL 99 94*  CO2 30 31  GLUCOSE 98 102*  BUN 6* 7*  CREATININE 0.68 0.71  CALCIUM 7.8* 8.4*   GFR: Estimated Creatinine Clearance: 98.3 mL/min (by C-G formula based on SCr of 0.71 mg/dL). Liver Function Tests: No results for input(s): "AST", "ALT", "ALKPHOS", "BILITOT", "PROT", "ALBUMIN" in the last 168 hours. No results for input(s): "LIPASE", "AMYLASE" in the last 168 hours. No results for input(s): "AMMONIA" in the last 168 hours. Coagulation Profile: Recent Labs  Lab 06/04/22 1434  INR 1.1   Cardiac Enzymes: No results for input(s): "CKTOTAL", "CKMB", "CKMBINDEX", "TROPONINI" in the last 168 hours. BNP (last 3 results) No results for input(s): "PROBNP" in the last 8760 hours. HbA1C: No results for input(s): "HGBA1C" in the last 72 hours. CBG: Recent Labs  Lab 06/05/22 1217 06/05/22 1715 06/05/22 2323 06/06/22 0801 06/06/22 1154  GLUCAP 118* 107* 149* 106* 98   Lipid Profile: No results for input(s): "CHOL", "HDL", "LDLCALC", "TRIG", "CHOLHDL", "LDLDIRECT" in the last 72 hours. Thyroid Function Tests: Recent Labs    06/05/22 0110  TSH 0.407   Anemia Panel: No results for input(s): "VITAMINB12", "FOLATE", "FERRITIN", "TIBC", "IRON", "RETICCTPCT" in the last 72 hours. Sepsis Labs: Recent Labs  Lab 06/04/22 1434 06/04/22 1705 06/04/22 2054 06/04/22 2257 06/05/22 0110  PROCALCITON <0.10  --   --   --   --   LATICACIDVEN 1.9 1.2 2.4* 1.1 1.1    Recent Results (from the past 240 hour(s))  MRSA Next Gen by PCR, Nasal     Status: None   Collection Time: 06/04/22  9:44 AM   Specimen: Nasal Mucosa; Nasal Swab  Result Value Ref Range Status   MRSA by PCR Next Gen NOT DETECTED NOT DETECTED Final    Comment: (NOTE) The GeneXpert MRSA Assay (FDA  approved for NASAL specimens only), is one component of a comprehensive MRSA colonization surveillance program. It is not intended to diagnose MRSA infection nor to guide or monitor treatment for MRSA infections. Test performance is not FDA approved in patients less than 66 years old. Performed at [facility], [place] [address] [place], [place] [address]   Resp panel by RT-PCR (RSV, Flu A&B, Covid) Anterior Nasal Swab     Status: None   Collection Time: 06/04/22  1:06 PM  Specimen: Anterior Nasal Swab  Result Value Ref Range Status   SARS Coronavirus 2 by RT PCR NEGATIVE NEGATIVE Final   Influenza A by PCR NEGATIVE NEGATIVE Final   Influenza B by PCR NEGATIVE NEGATIVE Final    Comment: (NOTE) The Xpert Xpress SARS-CoV-2/FLU/RSV plus assay is intended as an aid in the diagnosis of influenza from Nasopharyngeal swab specimens and should not be used as a sole basis for treatment. Nasal washings and aspirates are unacceptable for Xpert Xpress SARS-CoV-2/FLU/RSV testing.  Fact Sheet for Patients: EntrepreneurPulse.com.au  Fact Sheet for Healthcare Providers: IncredibleEmployment.be  This test is not yet approved or cleared by the [place] FDA and has been authorized for detection and/or diagnosis of SARS-CoV-2 by FDA under an Emergency Use Authorization (EUA). This EUA will remain in effect (meaning this test can be used) for the duration of the COVID-19 declaration under Section 564(b)(1) of the Act, 21 U.S.C. section 360bbb-3(b)(1), unless the authorization is terminated or revoked.     Resp Syncytial Virus by PCR NEGATIVE NEGATIVE Final    Comment: (NOTE) Fact Sheet for Patients: EntrepreneurPulse.com.au  Fact Sheet for Healthcare Providers: IncredibleEmployment.be  This test is not yet approved or cleared by the [place] FDA and has been authorized for detection and/or diagnosis of  SARS-CoV-2 by FDA under an Emergency Use Authorization (EUA). This EUA will remain in effect (meaning this test can be used) for the duration of the COVID-19 declaration under Section 564(b)(1) of the Act, 21 U.S.C. section 360bbb-3(b)(1), unless the authorization is terminated or revoked.  Performed at [facility], [place] [address], [place], [place] [address]   Respiratory (~20 pathogens) panel by PCR     Status: None   Collection Time: 06/04/22  1:06 PM   Specimen: Nasopharyngeal Swab; Respiratory  Result Value Ref Range Status   Adenovirus NOT DETECTED NOT DETECTED Final   Coronavirus 229E NOT DETECTED NOT DETECTED Final    Comment: (NOTE) The Coronavirus on the Respiratory Panel, DOES NOT test for the novel  Coronavirus (2019 nCoV)    Coronavirus HKU1 NOT DETECTED NOT DETECTED Final   Coronavirus NL63 NOT DETECTED NOT DETECTED Final   Coronavirus OC43 NOT DETECTED NOT DETECTED Final   Metapneumovirus NOT DETECTED NOT DETECTED Final   Rhinovirus / Enterovirus NOT DETECTED NOT DETECTED Final   Influenza A NOT DETECTED NOT DETECTED Final   Influenza B NOT DETECTED NOT DETECTED Final   Parainfluenza Virus 1 NOT DETECTED NOT DETECTED Final   Parainfluenza Virus 2 NOT DETECTED NOT DETECTED Final   Parainfluenza Virus 3 NOT DETECTED NOT DETECTED Final   Parainfluenza Virus 4 NOT DETECTED NOT DETECTED Final   Respiratory Syncytial Virus NOT DETECTED NOT DETECTED Final   Bordetella pertussis NOT DETECTED NOT DETECTED Final   Bordetella Parapertussis NOT DETECTED NOT DETECTED Final   Chlamydophila pneumoniae NOT DETECTED NOT DETECTED Final   Mycoplasma pneumoniae NOT DETECTED NOT DETECTED Final    Comment: Performed at [facility], [place]. [address], [place], [place] [address]  Blood Culture (routine x 2)     Status: None (Preliminary result)   Collection Time: 06/04/22  3:00 PM   Specimen: BLOOD RIGHT ARM  Result Value Ref Range Status   Specimen Description BLOOD RIGHT  ARM  Final   Special Requests   Final    BOTTLES DRAWN AEROBIC AND ANAEROBIC Blood Culture adequate volume   Culture   Final    NO GROWTH 2 DAYS Performed at [facility],  [address] [place], [place] [address]    Report Status PENDING  Incomplete  Blood Culture (routine x 2)     Status: None (Preliminary result)   Collection Time: 06/04/22  8:53 PM   Specimen: BLOOD RIGHT FOREARM  Result Value Ref Range Status   Specimen Description BLOOD RIGHT FOREARM  Final   Special Requests   Final    BOTTLES DRAWN AEROBIC AND ANAEROBIC Blood Culture results may not be optimal due to an excessive volume of blood received in culture bottles   Culture   Final    NO GROWTH 2 DAYS Performed at [facility], [place] [address], [place], [place] [address]    Report Status PENDING  Incomplete         Radiology Studies: VAS [place] LOWER EXTREMITY VENOUS (DVT)  Result Date: 06/05/2022  Lower Venous DVT Study Patient Name:  Michele Owens  Date of Exam:   06/05/2022 Medical Rec #: [id number]      Accession #:    [id number] Date of Birth: 1961/02/20     Patient Gender: F Patient Age:   10 years Exam Location:  [facility] Procedure:      VAS [place] LOWER EXTREMITY VENOUS (DVT) Referring Phys: Eulogio Bear --------------------------------------------------------------------------------  Indications: Swelling, and Pain.  Comparison Study: Prior negative bilateral LEV done 06/18/18 Performing Technologist: Sharion Dove RVS  Examination Guidelines: A complete evaluation includes B-mode imaging, spectral Doppler, color Doppler, and power Doppler as needed of all accessible portions of each vessel. Bilateral testing is considered an integral part of a complete examination. Limited examinations for reoccurring indications may be performed as noted. The reflux portion of the exam is performed with the patient in reverse Trendelenburg.   +---------+---------------+---------+-----------+----------+--------------+ RIGHT    CompressibilityPhasicitySpontaneityPropertiesThrombus Aging +---------+---------------+---------+-----------+----------+--------------+ CFV      Full           Yes      Yes                                 +---------+---------------+---------+-----------+----------+--------------+ SFJ      Full                                                        +---------+---------------+---------+-----------+----------+--------------+ FV Prox  Full                                                        +---------+---------------+---------+-----------+----------+--------------+ FV Mid   Full                                                        +---------+---------------+---------+-----------+----------+--------------+ FV DistalFull                                                        +---------+---------------+---------+-----------+----------+--------------+  PFV      Full                                                        +---------+---------------+---------+-----------+----------+--------------+ POP      Full           Yes      Yes                                 +---------+---------------+---------+-----------+----------+--------------+ PTV      Full                                                        +---------+---------------+---------+-----------+----------+--------------+ PERO     Full                                                        +---------+---------------+---------+-----------+----------+--------------+   +---------+---------------+---------+-----------+----------+--------------+ LEFT     CompressibilityPhasicitySpontaneityPropertiesThrombus Aging +---------+---------------+---------+-----------+----------+--------------+ CFV      Full           Yes      Yes                                  +---------+---------------+---------+-----------+----------+--------------+ SFJ      Full                                                        +---------+---------------+---------+-----------+----------+--------------+ FV Prox  Full                                                        +---------+---------------+---------+-----------+----------+--------------+ FV Mid   Full                                                        +---------+---------------+---------+-----------+----------+--------------+ FV DistalFull                                                        +---------+---------------+---------+-----------+----------+--------------+ PFV      Full                                                        +---------+---------------+---------+-----------+----------+--------------+   POP      Full           Yes      Yes                                 +---------+---------------+---------+-----------+----------+--------------+ PTV      Full                                                        +---------+---------------+---------+-----------+----------+--------------+ PERO     Full                                                        +---------+---------------+---------+-----------+----------+--------------+     Summary: BILATERAL: - No evidence of deep vein thrombosis seen in the lower extremities, bilaterally. -No evidence of popliteal cyst, bilaterally.   *See table(s) above for measurements and observations. Electronically signed by Servando Snare MD on 06/05/2022 at 5:15:41 PM.    Final    DG Chest Port 1 View  Result Date: 06/04/2022 CLINICAL DATA:  Chest pain and shortness of breath. EXAM: PORTABLE CHEST 1 VIEW COMPARISON:  Chest x-rays dated 05/14/2022 and 01/31/2022. FINDINGS: Heart size and mediastinal contours are stable. Chronic bibasilar atelectasis. No new lung findings. No pleural effusion or pneumothorax is seen. IMPRESSION: No active  disease. Chronic bibasilar atelectasis. Electronically Signed   By: Franki Cabot M.D.   On: 06/04/2022 14:00        Scheduled Meds:  busPIRone  5 mg Oral TID   clonazePAM  2 mg Oral TID   enoxaparin (LOVENOX) injection  40 mg Subcutaneous Q24H   furosemide  20 mg Intravenous BID   insulin aspart  0-15 Units Subcutaneous TID WC   insulin aspart  0-5 Units Subcutaneous QHS   levothyroxine  224 mcg Oral Q0600   umeclidinium-vilanterol  1 puff Inhalation Daily   Continuous Infusions:  vancomycin Stopped (06/06/22 0605)     LOS: 2 days    Time spent: 45 minutes spent on chart review, discussion with nursing staff, consultants, updating family and interview/physical exam; more than 50% of that time was spent in counseling and/or coordination of care.    Geradine Girt, DO [facility] Available via Epic secure chat 7am-7pm After these hours, please refer to coverage provider listed on [email or web address] 06/06/2022, 12:28 PM

## 2022-06-06 NOTE — Evaluation (Signed)
Physical Therapy Evaluation Patient Details Name: Michele Owens MRN: [id number] DOB: April 02, 1961 Today's Date: 06/06/2022  History of Present Illness  62 yo female presents to [facility] on 2/25 with SOB and acute respiratory failure, no PNA or other changes on imaging of lungs but increased O2 use.  Pt is also still grieving her son's death from last year.  PMHx:  gout, DM, CHF, COPD, depression and bipolar, DVT, anxiety, anasarca, pulm edema, thyroid CA, HTN, hypothyroid, dyslipidemia, obesity  Clinical Impression  Pt was seen for mobility and comes from  home with some caregiver help and otherwise independently walking.  Has rollator per her description, and is all level in home environment.  Will recommend her to have HHPT for strength and balance control as well as gait quality, and to progress with independence and distances to avoid fall risk and reduce her energy needed to succeed between caregiver visits.  Follow acutely for goals of PT and focus on above needs for fall risk reduction.  Pt is in agreement with the plan for home.       Recommendations for follow up therapy are one component of a multi-disciplinary discharge planning process, led by the attending physician.  Recommendations may be updated based on patient status, additional functional criteria and insurance authorization.  Follow Up Recommendations Home health PT      Assistance Recommended at Discharge Intermittent Supervision/Assistance  Patient can return home with the following  A little help with walking and/or transfers;A little help with bathing/dressing/bathroom;Assist for transportation;Help with stairs or ramp for entrance    Equipment Recommendations None recommended by PT  Recommendations for Other Services       Functional Status Assessment Patient has had a recent decline in their functional status and demonstrates the ability to make significant improvements in function in a reasonable and predictable amount  of time.     Precautions / Restrictions Precautions Precautions: Fall Precaution Comments: 3L O2 baseline Restrictions Weight Bearing Restrictions: No      Mobility  Bed Mobility Overal bed mobility: Needs Assistance Bed Mobility: Supine to Sit, Sit to Supine     Supine to sit: Supervision Sit to supine: Supervision        Transfers Overall transfer level: Needs assistance Equipment used: Rolling walker (2 wheels) Transfers: Sit to/from Stand Sit to Stand: Min guard           General transfer comment: taking her time to stand and min guard for safety    Ambulation/Gait Ambulation/Gait assistance: Supervision, Min guard Gait Distance (Feet): 35 Feet Assistive device: Rolling walker (2 wheels) Gait Pattern/deviations: Step-through pattern, Decreased stride length Gait velocity: reduced Gait velocity interpretation: <1.31 ft/sec, indicative of household ambulator Pre-gait activities: standing balance ck General Gait Details: pt limited distance in room but asked to use BSC.  Able to handle entire transition to stand and dry herself alone  Stairs            Wheelchair Mobility    Modified Rankin (Stroke Patients Only)       Balance Overall balance assessment: Needs assistance Sitting-balance support: Feet supported Sitting balance-Leahy Scale: Good     Standing balance support: Bilateral upper extremity supported, During functional activity Standing balance-Leahy Scale: Fair Standing balance comment: fair to stand but with dynamic activity is more balanced with RW                             Pertinent Vitals/Pain Pain Assessment  Pain Assessment: Faces Faces Pain Scale: Hurts little more Pain Location: HA Pain Descriptors / Indicators: Guarding, Grimacing Pain Intervention(s): Limited activity within patient's tolerance, Monitored during session, Premedicated before session, Repositioned    Home Living Family/patient expects to be  discharged to:: Private residence Living Arrangements: Alone Available Help at Discharge: Other (Comment) Type of Home: Apartment Home Access: Level entry       Home Layout: One level Home Equipment: Cane - single Location manager (2 wheels);Rollator (4 wheels);Toilet riser Additional Comments: reports M-F 1-4 aide and Sat if needed    Prior Function Prior Level of Function : Independent/Modified Independent             Mobility Comments: using a rollator walker at home per pt ADLs Comments: aide for bathing and dressing, for home chores     Hand Dominance   Dominant Hand: Right    Extremity/Trunk Assessment   Upper Extremity Assessment Upper Extremity Assessment: Overall WFL for tasks assessed    Lower Extremity Assessment Lower Extremity Assessment: Generalized weakness    Cervical / Trunk Assessment Cervical / Trunk Assessment: Normal  Communication   Communication: No difficulties  Cognition Arousal/Alertness: Awake/alert Behavior During Therapy: WFL for tasks assessed/performed Overall Cognitive Status: History of cognitive impairments - at baseline                                          General Comments General comments (skin integrity, edema, etc.): pt is up to walk with RW and supervised for lines and use of O2, but is on O2 at home    Exercises     Assessment/Plan    PT Assessment Patient needs continued PT services  PT Problem List Decreased strength;Decreased balance;Decreased mobility;Obesity       PT Treatment Interventions DME instruction;Therapeutic activities;Gait training;Patient/family education;Therapeutic exercise;Balance training;Functional mobility training;Neuromuscular re-education    PT Goals (Current goals can be found in the Care Plan section)  Acute Rehab PT Goals Patient Stated Goal: to go home and continue therapy PT Goal Formulation: With patient Time For Goal Achievement:  06/13/22 Potential to Achieve Goals: Good    Frequency Min 3X/week     Co-evaluation               AM-PAC PT "6 Clicks" Mobility  Outcome Measure Help needed turning from your back to your side while in a flat bed without using bedrails?: A Little Help needed moving from lying on your back to sitting on the side of a flat bed without using bedrails?: A Little Help needed moving to and from a bed to a chair (including a wheelchair)?: A Little Help needed standing up from a chair using your arms (e.g., wheelchair or bedside chair)?: A Little Help needed to walk in hospital room?: A Little Help needed climbing 3-5 steps with a railing? : Total 6 Click Score: 16    End of Session Equipment Utilized During Treatment: Gait belt;Oxygen Activity Tolerance: Patient tolerated treatment well;Treatment limited secondary to medical complications (Comment) Patient left: in bed;with call bell/phone within reach;with bed alarm set (bedside for meal afterward with nursing to see her) Nurse Communication: Mobility status PT Visit Diagnosis: Unsteadiness on feet (R26.81);Muscle weakness (generalized) (M62.81)    Time: [id number] PT Time Calculation (min) (ACUTE ONLY): 35 min   Charges:   PT Evaluation $PT Eval Moderate Complexity: 1 Mod PT Treatments $Gait Training: 8-22  mins       Ramond Dial 06/06/2022, 2:31 PM  Mee Hives, PT PhD Acute Rehab Dept. Number: [place] [facility] [place]

## 2022-06-06 NOTE — Care Management (Signed)
  Transition of Care [facility]) Screening Note   Patient Details  Name: Michele Owens Date of Birth: [birth year]   Transition of Care [facility]) CM/SW Contact:    Carles Collet, RN Phone Number: 06/06/2022, 9:59 AM    Transition of Care Department [facility]) has reviewed patient and we will continue to monitor patient advancement through interdisciplinary progression rounds.   Patient admitted from home, 3L chronic working to wean to baseline, IV diuresis.

## 2022-06-07 ENCOUNTER — Other Ambulatory Visit (HOSPITAL_COMMUNITY): Payer: Self-pay

## 2022-06-07 DIAGNOSIS — I110 Hypertensive heart disease with heart failure: Secondary | ICD-10-CM | POA: Diagnosis not present

## 2022-06-07 DIAGNOSIS — E119 Type 2 diabetes mellitus without complications: Secondary | ICD-10-CM

## 2022-06-07 LAB — CBC
HCT: 37 % (ref 36.0–46.0)
Hemoglobin: 12 g/dL (ref 12.0–15.0)
MCH: 30.4 pg (ref 26.0–34.0)
MCHC: 32.4 g/dL (ref 30.0–36.0)
MCV: 93.7 fL (ref 80.0–100.0)
Platelets: 288 10*3/uL (ref 150–400)
RBC: 3.95 MIL/uL (ref 3.87–5.11)
RDW: 13 % (ref 11.5–15.5)
WBC: 15 10*3/uL — ABNORMAL HIGH (ref 4.0–10.5)
nRBC: 0 % (ref 0.0–0.2)

## 2022-06-07 LAB — BASIC METABOLIC PANEL
Anion gap: 11 (ref 5–15)
BUN: 7 mg/dL — ABNORMAL LOW (ref 8–23)
CO2: 28 mmol/L (ref 22–32)
Calcium: 8.8 mg/dL — ABNORMAL LOW (ref 8.9–10.3)
Chloride: 100 mmol/L (ref 98–111)
Creatinine, Ser: 0.66 mg/dL (ref 0.44–1.00)
GFR, Estimated: >60 mL/min (ref >60)
Glucose, Bld: 108 mg/dL — ABNORMAL HIGH (ref 70–99)
Potassium: 3.8 mmol/L (ref 3.5–5.1)
Sodium: 139 mmol/L (ref 135–145)

## 2022-06-07 LAB — GLUCOSE, CAPILLARY: Glucose-Capillary: 110 mg/dL — ABNORMAL HIGH (ref 70–99)

## 2022-06-07 MED ORDER — DOXYCYCLINE HYCLATE 100 MG PO TABS
100.0000 mg | ORAL_TABLET | Freq: Two times a day (BID) | ORAL | 0 refills | Status: AC
  Filled 2022-06-07: qty 14, 7d supply, fill #0

## 2022-06-07 MED ORDER — DOXYCYCLINE HYCLATE 100 MG PO TABS: 100.0000 mg | ORAL_TABLET | Freq: Two times a day (BID) | ORAL | 0 refills | Status: DC

## 2022-06-07 MED ORDER — AMOXICILLIN-POT CLAVULANATE 875-125 MG PO TABS
1.0000 | ORAL_TABLET | Freq: Two times a day (BID) | ORAL | 0 refills | Status: AC
  Filled 2022-06-07: qty 14, 7d supply, fill #0

## 2022-06-07 NOTE — Progress Notes (Signed)
Patients PIV infiltrated at beginning of my shift. IV team attempted to place a new PIV but patient immediately told IV team that the new PIV hurt and to remove the line. Patient has been adamant that she does not want a new PIV inserted. She states 'no, I'm good." 0600 Vancomycin unable to be administered.

## 2022-06-07 NOTE — TOC Transition Note (Signed)
Transition of Care [facility]) - CM/SW Discharge Note   Patient Details  Name: Michele Owens MRN: [id number] Date of Birth: [birth year]  Transition of Care [facility]) CM/SW Contact:  Carles Collet, RN Phone Number: 06/07/2022, 11:11 AM   Clinical Narrative:     Spoke w patient at bedside she is refusing [facility] services and would like referral made for outpatient PT OT. Discussed locations and referral made to [place]. She is aware they will call her to set up appointment. She has POC O2 in place and is awaiting ride. Meds through [place]  Final next level of care: Home/Self Care Barriers to Discharge: No Barriers Identified   Patient Goals and CMS Choice      Discharge Placement                         Discharge Plan and Services Additional resources added to the After Visit Summary for                  DME Arranged: N/A         HH Arranged: Refused HH          Social Determinants of Health (SDOH) Interventions SDOH Screenings   Food Insecurity: No Food Insecurity (05/15/2022)  Housing: Low Risk  (05/15/2022)  Transportation Needs: No Transportation Needs (05/15/2022)  Utilities: At Risk (05/15/2022)  Alcohol Screen: Low Risk  (01/26/2021)  Depression (PHQ2-9): Low Risk  (03/21/2021)  Financial Resource Strain: Low Risk  (02/01/2021)  Physical Activity: Inactive (02/01/2021)  Social Connections: Moderately Isolated (01/27/2021)  Stress: Stress Concern Present (02/23/2021)  Tobacco Use: Medium Risk (06/04/2022)     Readmission Risk Interventions    05/18/2022   11:29 AM  Readmission Risk Prevention Plan  Transportation Screening Complete  HRI or Home Care Consult Complete  Social Work Consult for [place] Planning/Counseling Complete  Palliative Care Screening Complete  Medication Review [employer]) Referral to Pharmacy

## 2022-06-07 NOTE — Discharge Summary (Signed)
PATIENT DETAILS Name: Michele Owens Age: 62 y.o. Sex: female Date of Birth: May 29, 1960 MRN: [id number]. Admitting Physician: Geradine Girt, DO [id number], Henderson Newcomer, MD  Admit Date: 06/04/2022 Discharge date: 06/07/2022  Recommendations for Outpatient Follow-up:  Follow up with PCP in 1-2 weeks Please obtain CMP/CBC in one week  Admitted From:  Home  Disposition: Home health   Discharge Condition: good  CODE STATUS:   Code Status: Full Code   Diet recommendation:  Diet Order             Diet - low sodium heart healthy           Diet Carb Modified           Diet heart healthy/carb modified Room service appropriate? Yes; Fluid consistency: Thin  Diet effective now                    Brief Summary: 62 year old with history of DM-2, HTN, chronic HFpEF, COPD, chronic hypoxic respiratory failure on 3 L of oxygen at home-who presented with shortness of breath, and possible right vulvar cellulitis.  Brief Hospital Course: Acute on chronic hypoxic respiratory failure Acute on chronic HFpEF Feels much better Back on usual 3 L of oxygen Treated with IV Lasix Not felt to have PNA Will transition to oral diuretic regimen on discharge  ?  Right vulvar cellulitis Apparently had a small area of swelling in the right vulvar/perineal area On my exam today-area looks relatively benign (chaperone-RN Desiree)-with a small swelling without any fluctuation-no erythema-area does not appear tender.  Patient reports significant improvement over the past few days.  She feels much better-and is already ready/addressed to go home today.  She is requesting discharge today.  She does however have some mild leukocytosis but is not toxic-appearing-and afebrile. Was treated with IV vancomycin-will transition to oral antibiotic on discharge Blood cultures was negative.  HTN BP creeping up Resume Coreg/lisinopril on discharge.  HLD Continue statin  DM-2 Appears to be diet  controlled-last A1c 6.1 on 02/01/2022  Hypothyroidism Continue Synthroid  Chronic hypoxic respiratory failure on home O2 3 L COPD Stable-not in exacerbation Continue bronchodilator regimen  Chronic debility/deconditioning Walks with the help of a walker at baseline Claims daughter will be with her for the next few days Home health will be ordered  Morbid Obesity: Estimated body mass index is 45.56 kg/m as calculated from the following:   Height as of 05/15/22: 5' 5"$  (1.651 m).   Weight as of this encounter: 124.2 kg.    Discharge Diagnoses:  Principal Problem:   Fever Active Problems:   Current mild episode of major depressive disorder without prior episode [facility])   Discharge Instructions:  Activity:  As tolerated with Full fall precautions use walker/cane & assistance as needed  Discharge Instructions     (HEART FAILURE PATIENTS) Call MD:  Anytime you have any of the following symptoms: 1) 3 pound weight gain in 24 hours or 5 pounds in 1 week 2) shortness of breath, with or without a dry hacking cough 3) swelling in the hands, feet or stomach 4) if you have to sleep on extra pillows at night in order to breathe.   Complete by: As directed    Call MD for:  difficulty breathing, headache or visual disturbances   Complete by: As directed    Call MD for:  redness, tenderness, or signs of infection (pain, swelling, redness, odor or green/yellow discharge around incision site)   Complete by: As  directed    Diet - low sodium heart healthy   Complete by: As directed    Diet Carb Modified   Complete by: As directed    Increase activity slowly   Complete by: As directed       Allergies as of 06/07/2022       Reactions   Bee Venom Swelling, Other (See Comments)   "All over my body" (swelling)   Fioricet [butalbital-apap-caffeine] Nausea And Vomiting, Rash   Ibuprofen Rash, Other (See Comments)   Severe rash   Lamisil [terbinafine] Rash, Other (See Comments)   Pt states  this causes her to "feel funny"   Nsaids Other (See Comments)   Per MD's orders         Medication List     TAKE these medications    acetaminophen 500 MG tablet Commonly known as: TYLENOL Take 1,000 mg by mouth every 6 (six) hours as needed for moderate pain or headache.   albuterol 108 (90 Base) MCG/ACT inhaler Commonly known as: VENTOLIN HFA Inhale 1-2 puffs into the lungs every 6 (six) hours as needed for wheezing or shortness of breath.   allopurinol 100 MG tablet Commonly known as: ZYLOPRIM Take 100 mg by mouth daily.   amoxicillin-clavulanate 875-125 MG tablet Commonly known as: AUGMENTIN Take 1 tablet by mouth 2 (two) times daily for 7 days.   Anoro Ellipta 62.5-25 MCG/ACT Aepb Generic drug: umeclidinium-vilanterol Inhale 1 puff into the lungs daily.   aspirin EC 81 MG tablet Take 81 mg by mouth daily. Swallow whole.   blood glucose meter kit and supplies Kit Dispense based on patient and insurance preference. Use up to four times daily as directed.   busPIRone 5 MG tablet Commonly known as: BUSPAR Take 5 mg by mouth 3 (three) times daily.   calcitRIOL 0.5 MCG capsule Commonly known as: ROCALTROL Take 1 capsule (0.5 mcg total) by mouth daily.   carvedilol 3.125 MG tablet Commonly known as: COREG Take 1 tablet (3.125 mg total) by mouth 2 (two) times daily with a meal.   clonazePAM 2 MG tablet Commonly known as: KLONOPIN Take 1 tablet (2 mg total) by mouth 3 (three) times daily. for anxiety What changed: additional instructions   cyclobenzaprine 10 MG tablet Commonly known as: FLEXERIL Take 10 mg by mouth 3 (three) times daily as needed for muscle spasms.   dicyclomine 20 MG tablet Commonly known as: BENTYL Take 1 tablet (20 mg total) by mouth 2 (two) times daily.   doxycycline 100 MG tablet Commonly known as: VIBRA-TABS Take 1 tablet (100 mg total) by mouth 2 (two) times daily for 7 days.   EPINEPHrine 0.3 mg/0.3 mL Soaj injection Commonly  known as: EPI-PEN Inject 0.3 mg into the muscle once as needed for anaphylaxis.   fenofibrate 145 MG tablet Commonly known as: TRICOR Take 145 mg by mouth daily.   FeroSul 325 (65 FE) MG tablet Generic drug: ferrous sulfate Take 325 mg by mouth daily.   ipratropium-albuterol 0.5-2.5 (3) MG/3ML Soln Commonly known as: DUONEB Take 3 mLs by nebulization every 6 (six) hours as needed (for shortness of breath).   levothyroxine 112 MCG tablet Commonly known as: SYNTHROID Take 224 mcg by mouth daily before breakfast.   lisinopril 5 MG tablet Commonly known as: ZESTRIL Take 1 tablet (5 mg total) by mouth daily. Please hold this medication for three days, please check your blood pressure at home, bring in record for your pcp to review   loperamide 2 MG  capsule Commonly known as: IMODIUM Take 1 capsule (2 mg total) by mouth 4 (four) times daily as needed for diarrhea or loose stools.   loratadine 10 MG tablet Commonly known as: CLARITIN Take 10 mg by mouth daily as needed for allergies.   nitroGLYCERIN 0.4 MG SL tablet Commonly known as: NITROSTAT Place 1 tablet (0.4 mg total) under the tongue every 5 (five) minutes as needed for chest pain.   nystatin cream Commonly known as: MYCOSTATIN Apply 1 Application topically 2 (two) times daily as needed for dry skin.   omeprazole 20 MG capsule Commonly known as: PRILOSEC Take 20 mg by mouth 2 (two) times daily as needed (acid reflux).   ondansetron 4 MG tablet Commonly known as: ZOFRAN Take 4 mg by mouth every 8 (eight) hours as needed for nausea or vomiting.   oxyCODONE-acetaminophen 10-325 MG tablet Commonly known as: PERCOCET Take 1 tablet by mouth every 6 (six) hours as needed for pain.   OXYGEN Inhale 3 L into the lungs continuous.   phentermine 37.5 MG tablet Commonly known as: ADIPEX-P Take 37.5 mg by mouth daily.   polyethylene glycol 17 g packet Commonly known as: MIRALAX / GLYCOLAX Take 17 g by mouth daily as  needed for mild constipation.   potassium chloride SA 20 MEQ tablet Commonly known as: KLOR-CON M Take 1 tablet (20 mEq total) by mouth 2 (two) times daily.   rosuvastatin 10 MG tablet Commonly known as: CRESTOR Take 10 mg by mouth every evening.   senna-docusate 8.6-50 MG tablet Commonly known as: Senokot-S Take 1 tablet by mouth daily.   torsemide 20 MG tablet Commonly known as: DEMADEX Take 1 tablet (20 mg total) by mouth 2 (two) times daily.   traZODone 50 MG tablet Commonly known as: DESYREL Take 100 mg by mouth at bedtime.   triamcinolone cream 0.1 % Commonly known as: KENALOG Apply 1 application. topically 2 (two) times daily as needed (irrritation).   valACYclovir 1000 MG tablet Commonly known as: VALTREX Take 1,000 mg by mouth daily.        Allergies  Allergen Reactions   Bee Venom Swelling and Other (See Comments)    "All over my body" (swelling)   Fioricet [Butalbital-Apap-Caffeine] Nausea And Vomiting and Rash   Ibuprofen Rash and Other (See Comments)    Severe rash   Lamisil [Terbinafine] Rash and Other (See Comments)    Pt states this causes her to "feel funny"   Nsaids Other (See Comments)    Per MD's orders      Other Procedures/Studies: VAS [place] LOWER EXTREMITY VENOUS (DVT)  Result Date: 06/05/2022  Lower Venous DVT Study Patient Name:  Michele Owens  Date of Exam:   06/05/2022 Medical Rec #: [id number]      Accession #:    [id number] Date of Birth: 1960-11-14     Patient Gender: F Patient Age:   74 years Exam Location:  [facility] Procedure:      VAS [place] LOWER EXTREMITY VENOUS (DVT) Referring Phys: Eulogio Bear --------------------------------------------------------------------------------  Indications: Swelling, and Pain.  Comparison Study: Prior negative bilateral LEV done 06/18/18 Performing Technologist: Sharion Dove RVS  Examination Guidelines: A complete evaluation includes B-mode imaging, spectral Doppler, color Doppler, and power  Doppler as needed of all accessible portions of each vessel. Bilateral testing is considered an integral part of a complete examination. Limited examinations for reoccurring indications may be performed as noted. The reflux portion of the exam is performed with the patient in reverse  Trendelenburg.  +---------+---------------+---------+-----------+----------+--------------+ RIGHT    CompressibilityPhasicitySpontaneityPropertiesThrombus Aging +---------+---------------+---------+-----------+----------+--------------+ CFV      Full           Yes      Yes                                 +---------+---------------+---------+-----------+----------+--------------+ SFJ      Full                                                        +---------+---------------+---------+-----------+----------+--------------+ FV Prox  Full                                                        +---------+---------------+---------+-----------+----------+--------------+ FV Mid   Full                                                        +---------+---------------+---------+-----------+----------+--------------+ FV DistalFull                                                        +---------+---------------+---------+-----------+----------+--------------+ PFV      Full                                                        +---------+---------------+---------+-----------+----------+--------------+ POP      Full           Yes      Yes                                 +---------+---------------+---------+-----------+----------+--------------+ PTV      Full                                                        +---------+---------------+---------+-----------+----------+--------------+ PERO     Full                                                        +---------+---------------+---------+-----------+----------+--------------+    +---------+---------------+---------+-----------+----------+--------------+ LEFT     CompressibilityPhasicitySpontaneityPropertiesThrombus Aging +---------+---------------+---------+-----------+----------+--------------+ CFV      Full           Yes      Yes                                 +---------+---------------+---------+-----------+----------+--------------+  SFJ      Full                                                        +---------+---------------+---------+-----------+----------+--------------+ FV Prox  Full                                                        +---------+---------------+---------+-----------+----------+--------------+ FV Mid   Full                                                        +---------+---------------+---------+-----------+----------+--------------+ FV DistalFull                                                        +---------+---------------+---------+-----------+----------+--------------+ PFV      Full                                                        +---------+---------------+---------+-----------+----------+--------------+ POP      Full           Yes      Yes                                 +---------+---------------+---------+-----------+----------+--------------+ PTV      Full                                                        +---------+---------------+---------+-----------+----------+--------------+ PERO     Full                                                        +---------+---------------+---------+-----------+----------+--------------+     Summary: BILATERAL: - No evidence of deep vein thrombosis seen in the lower extremities, bilaterally. -No evidence of popliteal cyst, bilaterally.   *See table(s) above for measurements and observations. Electronically signed by Servando Snare MD on 06/05/2022 at 5:15:41 PM.    Final    DG Chest Port 1 View  Result Date: 06/04/2022 CLINICAL DATA:   Chest pain and shortness of breath. EXAM: PORTABLE CHEST 1 VIEW COMPARISON:  Chest x-rays dated 05/14/2022 and 01/31/2022. FINDINGS: Heart size and mediastinal contours are stable. Chronic bibasilar atelectasis. No new lung findings. No pleural effusion or pneumothorax is seen. IMPRESSION: No active disease. Chronic bibasilar atelectasis.  Electronically Signed   By: Franki Cabot M.D.   On: 06/04/2022 14:00   ECHOCARDIOGRAM COMPLETE  Result Date: 05/15/2022    ECHOCARDIOGRAM REPORT   Patient Name:   Michele Owens Date of Exam: 05/15/2022 Medical Rec #:  [id number]     Height:       65.0 in Accession #:    [id number]    Weight:       280.3 lb Date of Birth:  [birth year]    BSA:          2.283 m Patient Age:    56 years      BP:           117/69 mmHg Patient Gender: F             HR:           83 bpm. Exam Location:  Inpatient Procedure: 2D Echo, Cardiac Doppler, Color Doppler and Intracardiac            Opacification Agent Indications:    [id number] Acute diastolic (congestive) heart failure  History:        Patient has prior history of Echocardiogram examinations. CHF,                 COPD; Risk Factors:Diabetes and Hypertension.  Sonographer:    Phineas Douglas Referring Phys: [place]  1. Left ventricular ejection fraction, by estimation, is 55 to 60%. The left ventricle has normal function. The left ventricle has no regional wall motion abnormalities. There is mild concentric left ventricular hypertrophy. Left ventricular diastolic parameters are consistent with Grade I diastolic dysfunction (impaired relaxation).  2. Very mildly D-shaped interventricular septum suggestive of RV pressure/volume overload. Right ventricular systolic function is normal. The right ventricular size is normal.  3. Right atrial size was mildly dilated.  4. The mitral valve is normal in structure. Trivial mitral valve regurgitation. No evidence of mitral stenosis.  5. The aortic valve is tricuspid. Aortic valve  regurgitation is not visualized. No aortic stenosis is present.  6. IVC not visualized. Peak RV-RA gradient 17 mmHg.  7. Technically difficult study with poor acoustic windows. FINDINGS  Left Ventricle: Left ventricular ejection fraction, by estimation, is 55 to 60%. The left ventricle has normal function. The left ventricle has no regional wall motion abnormalities. Definity contrast agent was given IV to delineate the left ventricular  endocardial borders. The left ventricular internal cavity size was normal in size. There is mild concentric left ventricular hypertrophy. Left ventricular diastolic parameters are consistent with Grade I diastolic dysfunction (impaired relaxation). Right Ventricle: Very mildly D-shaped interventricular septum suggestive of RV pressure/volume overload. The right ventricular size is normal. No increase in right ventricular wall thickness. Right ventricular systolic function is normal. Left Atrium: Left atrial size was normal in size. Right Atrium: Right atrial size was mildly dilated. Pericardium: There is no evidence of pericardial effusion. Mitral Valve: The mitral valve is normal in structure. There is mild calcification of the mitral valve leaflet(s). Mild mitral annular calcification. Trivial mitral valve regurgitation. No evidence of mitral valve stenosis. Tricuspid Valve: The tricuspid valve is normal in structure. Tricuspid valve regurgitation is trivial. Aortic Valve: The aortic valve is tricuspid. Aortic valve regurgitation is not visualized. No aortic stenosis is present. Pulmonic Valve: The pulmonic valve was normal in structure. Pulmonic valve regurgitation is not visualized. Aorta: The aortic root is normal in size and structure. Venous: The inferior vena cava was not well visualized. IAS/Shunts: No  atrial level shunt detected by color flow Doppler.  LEFT VENTRICLE PLAX 2D LVIDd:         4.00 cm      Diastology LVIDs:         2.90 cm      LV e' medial:    10.10 cm/s LV  PW:         1.20 cm      LV E/e' medial:  10.1 LV IVS:        1.20 cm      LV e' lateral:   7.71 cm/s LVOT diam:     2.00 cm      LV E/e' lateral: 13.2 LV SV:         65 LV SV Index:   29 LVOT Area:     3.14 cm  LV Volumes (MOD) LV vol d, MOD A2C: 196.0 ml LV vol d, MOD A4C: 192.0 ml LV vol s, MOD A2C: 74.7 ml LV vol s, MOD A4C: 76.5 ml LV SV MOD A2C:     121.3 ml LV SV MOD A4C:     192.0 ml LV SV MOD BP:      121.1 ml RIGHT VENTRICLE RV Basal diam:  4.30 cm RV S prime:     16.30 cm/s TAPSE (M-mode): 2.8 cm LEFT ATRIUM             Index        RIGHT ATRIUM           Index LA diam:        3.80 cm 1.66 cm/m   RA Area:     21.10 cm LA Vol (A2C):   60.7 ml 26.58 ml/m  RA Volume:   52.80 ml  23.12 ml/m LA Vol (A4C):   57.2 ml 25.05 ml/m LA Biplane Vol: 59.8 ml 26.19 ml/m  AORTIC VALVE LVOT Vmax:   97.00 cm/s LVOT Vmean:  63.300 cm/s LVOT VTI:    0.208 m  AORTA Ao Root diam: 3.40 cm Ao Asc diam:  3.50 cm MITRAL VALVE                TRICUSPID VALVE MV Area (PHT): 3.07 cm     TR Peak grad:   17.0 mmHg MV Decel Time: 247 msec     TR Vmax:        206.00 cm/s MV E velocity: 102.00 cm/s MV A velocity: 96.90 cm/s   SHUNTS MV E/A ratio:  1.05         Systemic VTI:  0.21 m                             Systemic Diam: 2.00 cm Dalton McleanMD Electronically signed by Franki Monte Signature Date/Time: 05/15/2022/1:27:30 PM    Final    DG Chest Portable 1 View  Result Date: 05/14/2022 CLINICAL DATA:  Cough, weakness, leg swelling. EXAM: PORTABLE CHEST 1 VIEW COMPARISON:  01/31/2022. FINDINGS: Heart is enlarged and the mediastinal contour stable. There is atherosclerotic calcification of the aorta. The pulmonary vasculature is distended. Patchy airspace disease is present at the lung bases bilaterally. No effusion or pneumothorax. Surgical clips are noted in the cervical soft tissues. No acute osseous abnormality. IMPRESSION: 1. Cardiomegaly with pulmonary vascular congestion. 2. Patchy airspace disease at the lung bases,  possible edema or infiltrate. Electronically Signed   By: Brett Fairy M.D.   On: 05/14/2022 23:37     TODAY-DAY  OF DISCHARGE:  Subjective:   A[place]ah Lundie today has no headache,no chest abdominal pain,no new weakness tingling or numbness, feels much better wants to go home today.   Objective:   Blood pressure 138/85, pulse 89, temperature 99.2 F (37.3 C), temperature source Oral, resp. rate 20, weight 124.2 kg, SpO2 93 %.  Intake/Output Summary (Last 24 hours) at 06/07/2022 0959 Last data filed at 06/06/2022 1300 Gross per 24 hour  Intake 253.67 ml  Output --  Net 253.67 ml   Filed Weights   06/05/22 0934 06/06/22 1200 06/07/22 0500  Weight: 128.1 kg 124.2 kg 124.2 kg    Exam: Awake Alert, Oriented *3, No new F.N deficits, Normal affect [place].AT,PERRAL Supple Neck,No JVD, No cervical lymphadenopathy appriciated.  Symmetrical Chest wall movement, Good air movement bilaterally, CTAB RRR,No Gallops,Rubs or new Murmurs, No Parasternal Heave +ve B.Sounds, Abd Soft, Non tender, No organomegaly appriciated, No rebound -guarding or rigidity. No Cyanosis, Clubbing or edema, No new Rash or bruise   PERTINENT RADIOLOGIC STUDIES: VAS [place] LOWER EXTREMITY VENOUS (DVT)  Result Date: 06/05/2022  Lower Venous DVT Study Patient Name:  Michele Owens  Date of Exam:   06/05/2022 Medical Rec #: [id number]      Accession #:    [id number] Date of Birth: [birth year]     Patient Gender: F Patient Age:   26 years Exam Location:  [facility] Procedure:      VAS [place] LOWER EXTREMITY VENOUS (DVT) Referring Phys: Eulogio Bear --------------------------------------------------------------------------------  Indications: Swelling, and Pain.  Comparison Study: Prior negative bilateral LEV done 06/18/18 Performing Technologist: Sharion Dove RVS  Examination Guidelines: A complete evaluation includes B-mode imaging, spectral Doppler, color Doppler, and power Doppler as needed of all accessible portions of each  vessel. Bilateral testing is considered an integral part of a complete examination. Limited examinations for reoccurring indications may be performed as noted. The reflux portion of the exam is performed with the patient in reverse Trendelenburg.  +---------+---------------+---------+-----------+----------+--------------+ RIGHT    CompressibilityPhasicitySpontaneityPropertiesThrombus Aging +---------+---------------+---------+-----------+----------+--------------+ CFV      Full           Yes      Yes                                 +---------+---------------+---------+-----------+----------+--------------+ SFJ      Full                                                        +---------+---------------+---------+-----------+----------+--------------+ FV Prox  Full                                                        +---------+---------------+---------+-----------+----------+--------------+ FV Mid   Full                                                        +---------+---------------+---------+-----------+----------+--------------+ FV DistalFull                                                        +---------+---------------+---------+-----------+----------+--------------+  PFV      Full                                                        +---------+---------------+---------+-----------+----------+--------------+ POP      Full           Yes      Yes                                 +---------+---------------+---------+-----------+----------+--------------+ PTV      Full                                                        +---------+---------------+---------+-----------+----------+--------------+ PERO     Full                                                        +---------+---------------+---------+-----------+----------+--------------+   +---------+---------------+---------+-----------+----------+--------------+ LEFT      CompressibilityPhasicitySpontaneityPropertiesThrombus Aging +---------+---------------+---------+-----------+----------+--------------+ CFV      Full           Yes      Yes                                 +---------+---------------+---------+-----------+----------+--------------+ SFJ      Full                                                        +---------+---------------+---------+-----------+----------+--------------+ FV Prox  Full                                                        +---------+---------------+---------+-----------+----------+--------------+ FV Mid   Full                                                        +---------+---------------+---------+-----------+----------+--------------+ FV DistalFull                                                        +---------+---------------+---------+-----------+----------+--------------+ PFV      Full                                                        +---------+---------------+---------+-----------+----------+--------------+  POP      Full           Yes      Yes                                 +---------+---------------+---------+-----------+----------+--------------+ PTV      Full                                                        +---------+---------------+---------+-----------+----------+--------------+ PERO     Full                                                        +---------+---------------+---------+-----------+----------+--------------+     Summary: BILATERAL: - No evidence of deep vein thrombosis seen in the lower extremities, bilaterally. -No evidence of popliteal cyst, bilaterally.   *See table(s) above for measurements and observations. Electronically signed by Servando Snare MD on 06/05/2022 at 5:15:41 PM.    Final      PERTINENT LAB RESULTS: CBC: Recent Labs    06/06/22 1326 06/07/22 0247  WBC 14.2* 15.0*  HGB 11.2* 12.0  HCT 35.6* 37.0  PLT 275 288    CMET CMP     Component Value Date/Time   NA 139 06/07/2022 0247   K 3.8 06/07/2022 0247   CL 100 06/07/2022 0247   CO2 28 06/07/2022 0247   GLUCOSE 108 (H) 06/07/2022 0247   BUN 7 (L) 06/07/2022 0247   CREATININE 0.66 06/07/2022 0247   CREATININE 0.62 06/02/2015 1337   CALCIUM 8.8 (L) 06/07/2022 0247   CALCIUM 7.2 (L) 09/25/2016 0414   PROT 7.2 03/08/2022 2339   ALBUMIN 3.6 03/08/2022 2339   AST 21 03/08/2022 2339   ALT 16 03/08/2022 2339   ALKPHOS 43 03/08/2022 2339   BILITOT 0.4 03/08/2022 2339   GFRNONAA >60 06/07/2022 0247   GFRAA >60 09/30/2019 1402    GFR Estimated Creatinine Clearance: 96.6 mL/min (by C-G formula based on SCr of 0.66 mg/dL). No results for input(s): "LIPASE", "AMYLASE" in the last 72 hours. No results for input(s): "CKTOTAL", "CKMB", "CKMBINDEX", "TROPONINI" in the last 72 hours. Invalid input(s): "POCBNP" Recent Labs    06/04/22 1512  DDIMER 0.31   No results for input(s): "HGBA1C" in the last 72 hours. No results for input(s): "CHOL", "HDL", "LDLCALC", "TRIG", "CHOLHDL", "LDLDIRECT" in the last 72 hours. Recent Labs    06/05/22 0110  TSH 0.407   No results for input(s): "VITAMINB12", "FOLATE", "FERRITIN", "TIBC", "IRON", "RETICCTPCT" in the last 72 hours. Coags: Recent Labs    06/04/22 1434  INR 1.1   Microbiology: Recent Results (from the past 240 hour(s))  MRSA Next Gen by PCR, Nasal     Status: None   Collection Time: 06/04/22  9:44 AM   Specimen: Nasal Mucosa; Nasal Swab  Result Value Ref Range Status   MRSA by PCR Next Gen NOT DETECTED NOT DETECTED Final    Comment: (NOTE) The GeneXpert MRSA Assay (FDA approved for NASAL specimens only), is one component of a comprehensive MRSA colonization surveillance program. It is not intended to diagnose MRSA infection nor to  guide or monitor treatment for MRSA infections. Test performance is not FDA approved in patients less than 56 years old. Performed at [facility],  [place] [address], [place], [place] [address]   Resp panel by RT-PCR (RSV, Flu A&B, Covid) Anterior Nasal Swab     Status: None   Collection Time: 06/04/22  1:06 PM   Specimen: Anterior Nasal Swab  Result Value Ref Range Status   SARS Coronavirus 2 by RT PCR NEGATIVE NEGATIVE Final   Influenza A by PCR NEGATIVE NEGATIVE Final   Influenza B by PCR NEGATIVE NEGATIVE Final    Comment: (NOTE) The Xpert Xpress SARS-CoV-2/FLU/RSV plus assay is intended as an aid in the diagnosis of influenza from Nasopharyngeal swab specimens and should not be used as a sole basis for treatment. Nasal washings and aspirates are unacceptable for Xpert Xpress SARS-CoV-2/FLU/RSV testing.  Fact Sheet for Patients: EntrepreneurPulse.com.au  Fact Sheet for Healthcare Providers: IncredibleEmployment.be  This test is not yet approved or cleared by the [place] FDA and has been authorized for detection and/or diagnosis of SARS-CoV-2 by FDA under an Emergency Use Authorization (EUA). This EUA will remain in effect (meaning this test can be used) for the duration of the COVID-19 declaration under Section 564(b)(1) of the Act, 21 U.S.C. section 360bbb-3(b)(1), unless the authorization is terminated or revoked.     Resp Syncytial Virus by PCR NEGATIVE NEGATIVE Final    Comment: (NOTE) Fact Sheet for Patients: EntrepreneurPulse.com.au  Fact Sheet for Healthcare Providers: IncredibleEmployment.be  This test is not yet approved or cleared by the [place] FDA and has been authorized for detection and/or diagnosis of SARS-CoV-2 by FDA under an Emergency Use Authorization (EUA). This EUA will remain in effect (meaning this test can be used) for the duration of the COVID-19 declaration under Section 564(b)(1) of the Act, 21 U.S.C. section 360bbb-3(b)(1), unless the authorization is terminated or revoked.  Performed at [facility], [place] [address], [place], [place] [address]   Respiratory (~20 pathogens) panel by PCR     Status: None   Collection Time: 06/04/22  1:06 PM   Specimen: Nasopharyngeal Swab; Respiratory  Result Value Ref Range Status   Adenovirus NOT DETECTED NOT DETECTED Final   Coronavirus 229E NOT DETECTED NOT DETECTED Final    Comment: (NOTE) The Coronavirus on the Respiratory Panel, DOES NOT test for the novel  Coronavirus (2019 nCoV)    Coronavirus HKU1 NOT DETECTED NOT DETECTED Final   Coronavirus NL63 NOT DETECTED NOT DETECTED Final   Coronavirus OC43 NOT DETECTED NOT DETECTED Final   Metapneumovirus NOT DETECTED NOT DETECTED Final   Rhinovirus / Enterovirus NOT DETECTED NOT DETECTED Final   Influenza A NOT DETECTED NOT DETECTED Final   Influenza B NOT DETECTED NOT DETECTED Final   Parainfluenza Virus 1 NOT DETECTED NOT DETECTED Final   Parainfluenza Virus 2 NOT DETECTED NOT DETECTED Final   Parainfluenza Virus 3 NOT DETECTED NOT DETECTED Final   Parainfluenza Virus 4 NOT DETECTED NOT DETECTED Final   Respiratory Syncytial Virus NOT DETECTED NOT DETECTED Final   Bordetella pertussis NOT DETECTED NOT DETECTED Final   Bordetella Parapertussis NOT DETECTED NOT DETECTED Final   Chlamydophila pneumoniae NOT DETECTED NOT DETECTED Final   Mycoplasma pneumoniae NOT DETECTED NOT DETECTED Final    Comment: Performed at [facility], [place]. [address], [place], [place] [address]  Blood Culture (routine x 2)     Status: None (Preliminary result)   Collection Time: 06/04/22  3:00 PM   Specimen: BLOOD RIGHT ARM  Result Value Ref Range Status   Specimen Description BLOOD RIGHT ARM  Final   Special Requests   Final    BOTTLES DRAWN AEROBIC AND ANAEROBIC Blood Culture adequate volume   Culture   Final    NO GROWTH 2 DAYS Performed at [facility], [address], [place], [place] [address]    Report Status PENDING  Incomplete  Blood Culture (routine x 2)     Status: None (Preliminary result)    Collection Time: 06/04/22  8:53 PM   Specimen: BLOOD RIGHT FOREARM  Result Value Ref Range Status   Specimen Description BLOOD RIGHT FOREARM  Final   Special Requests   Final    BOTTLES DRAWN AEROBIC AND ANAEROBIC Blood Culture results may not be optimal due to an excessive volume of blood received in culture bottles   Culture   Final    NO GROWTH 2 DAYS Performed at [facility], [place] [address] [place], [place] [address]    Report Status PENDING  Incomplete    FURTHER DISCHARGE INSTRUCTIONS:  Get Medicines reviewed and adjusted: Please take all your medications with you for your next visit with your Primary MD  Laboratory/radiological data: Please request your Primary MD to go over all hospital tests and procedure/radiological results at the follow up, please ask your Primary MD to get all Hospital records sent to his/her office.  In some cases, they will be blood work, cultures and biopsy results pending at the time of your discharge. Please request that your primary care M.D. goes through all the records of your hospital data and follows up on these results.  Also Note the following: If you experience worsening of your admission symptoms, develop shortness of breath, life threatening emergency, suicidal or homicidal thoughts you must seek medical attention immediately by calling 911 or calling your MD immediately  if symptoms less severe.  You must read complete instructions/literature along with all the possible adverse reactions/side effects for all the Medicines you take and that have been prescribed to you. Take any new Medicines after you have completely understood and accpet all the possible adverse reactions/side effects.   Do not drive when taking Pain medications or sleeping medications (Benzodaizepines)  Do not take more than prescribed Pain, Sleep and Anxiety Medications. It is not advisable to combine anxiety,sleep and pain medications without talking with your  primary care practitioner  Special Instructions: If you have smoked or chewed Tobacco  in the last 2 yrs please stop smoking, stop any regular Alcohol  and or any Recreational drug use.  Wear Seat belts while driving.  Please note: You were cared for by a hospitalist during your hospital stay. Once you are discharged, your primary care physician will handle any further medical issues. Please note that NO REFILLS for any discharge medications will be authorized once you are discharged, as it is imperative that you return to your primary care physician (or establish a relationship with a primary care physician if you do not have one) for your post hospital discharge needs so that they can reassess your need for medications and monitor your lab values.  Total Time spent coordinating discharge including counseling, education and face to face time equals greater than 30 minutes.  SignedOren Binet 06/07/2022 9:59 AM

## 2022-06-07 NOTE — Progress Notes (Signed)
Went over discharge papers with patient.  Patient advised her aide will be picking her up. Patient has her home oxygen unit in room with her.  Waiting on patient ride and [facility] pharmacy to bring meds to room.

## 2022-06-07 NOTE — Progress Notes (Signed)
Physical Therapy Treatment Patient Details Name: Michele Owens MRN: [id number] DOB: [birth year] Today's Date: 06/07/2022   History of Present Illness 62 yo female presents [facility] on 2/25 with SOB and acute respiratory failure, no PNA or other changes on imaging of lungs but increased O2 use.  Pt is also still grieving her son's death from last year.  PMHx:  gout, DM, CHF, COPD, depression and bipolar, DVT, anxiety, anasarca, pulm edema, thyroid CA, HTN, hypothyroid, dyslipidemia, obesity    PT Comments    Pt up in chair upon arrival of PT, only wanting to ambulate to her bag on her bed and back, declining any further mobility but performing with supervision and no AD, intermittent use of 1UE for support from bed and chair. Pt reports she has all needs at home, asked about RW, but upon further discussion with pt and chart review, she utilizes a rollator at baseline and has a RW, so no DME needs identified at this time. HHPT recommended at discharge but pt reports she does not want anyone coming to her house, except a friend who came before so she was going to ask her, but pt is interested in [place]. Acute PT will continue to follow during admission to progress mobility as able, discharge plan remains appropriate but if pt is refusing HHPT recommend OPPT to progress pt back to her mobility baseline.     Recommendations for follow up therapy are one component of a multi-disciplinary discharge planning process, led by the attending physician.  Recommendations may be updated based on patient status, additional functional criteria and insurance authorization.  Follow Up Recommendations  Home health PT (OPPT if pt refuses)     Assistance Recommended at Discharge Intermittent Supervision/Assistance  Patient can return home with the following A little help with walking and/or transfers;A little help with bathing/dressing/bathroom;Assist for transportation;Help with stairs or ramp for entrance   Equipment  Recommendations  None recommended by PT    Recommendations for Other Services       Precautions / Restrictions Precautions Precautions: Fall Precaution Comments: 3L O2 baseline Restrictions Weight Bearing Restrictions: No     Mobility  Bed Mobility               General bed mobility comments: pt seated in chair upon arrival    Transfers Overall transfer level: Needs assistance Equipment used: None Transfers: Sit to/from Stand Sit to Stand: Supervision           General transfer comment: standing with supervision, pt declining use of AD, wanting to get something out of her bag    Ambulation/Gait Ambulation/Gait assistance: Supervision Gait Distance (Feet): 5 Feet Assistive device: None Gait Pattern/deviations: Step-through pattern, Decreased stride length, Wide base of support Gait velocity: reduced     General Gait Details: pt only ambulating to the bed to get something from her bag and back, declining further ambulation or use of AD, intermittently reaching for UE support on bed   Stairs             Wheelchair Mobility    Modified Rankin (Stroke Patients Only)       Balance Overall balance assessment: Needs assistance Sitting-balance support: Feet supported Sitting balance-Leahy Scale: Good Sitting balance - Comments: stable sitting balance   Standing balance support: Bilateral upper extremity supported, During functional activity Standing balance-Leahy Scale: Fair Standing balance comment: intermittently utilizing UE support for balance with brief ambulation  Cognition Arousal/Alertness: Awake/alert Behavior During Therapy: WFL for tasks assessed/performed Overall Cognitive Status: History of cognitive impairments - at baseline                                 General Comments: pt talkative during today's session, majority of the time for education and listenting to pt         Exercises      General Comments General comments (skin integrity, edema, etc.): 3L O2 [place]      Pertinent Vitals/Pain Pain Assessment Pain Assessment: Faces Faces Pain Scale: No hurt    Home Living                          Prior Function            PT Goals (current goals can now be found in the care plan section) Acute Rehab PT Goals Patient Stated Goal: to go home and continue therapy PT Goal Formulation: With patient Time For Goal Achievement: 06/13/22 Potential to Achieve Goals: Good Progress towards PT goals: Progressing toward goals    Frequency    Min 3X/week      PT Plan Current plan remains appropriate    Co-evaluation              AM-PAC PT "6 Clicks" Mobility   Outcome Measure  Help needed turning from your back to your side while in a flat bed without using bedrails?: A Little Help needed moving from lying on your back to sitting on the side of a flat bed without using bedrails?: A Little Help needed moving to and from a bed to a chair (including a wheelchair)?: A Little Help needed standing up from a chair using your arms (e.g., wheelchair or bedside chair)?: A Little Help needed to walk in hospital room?: A Little Help needed climbing 3-5 steps with a railing? : Total 6 Click Score: 16    End of Session Equipment Utilized During Treatment: Oxygen Activity Tolerance: Patient tolerated treatment well Patient left: with call bell/phone within reach;in chair Nurse Communication: Mobility status PT Visit Diagnosis: Unsteadiness on feet (R26.81);Muscle weakness (generalized) (M62.81)     Time: 1034-1050 PT Time Calculation (min) (ACUTE ONLY): 16 min  Charges:  $Therapeutic Activity: 8-22 mins                     Charlynne Cousins, PT DPT Acute Rehabilitation Services Office [phone]    [name] 06/07/2022, 12:46 PM

## 2022-06-08 DIAGNOSIS — I110 Hypertensive heart disease with heart failure: Secondary | ICD-10-CM | POA: Diagnosis not present

## 2022-06-09 DIAGNOSIS — Z419 Encounter for procedure for purposes other than remedying health state, unspecified: Secondary | ICD-10-CM | POA: Diagnosis not present

## 2022-06-09 DIAGNOSIS — I110 Hypertensive heart disease with heart failure: Secondary | ICD-10-CM | POA: Diagnosis not present

## 2022-06-09 LAB — CULTURE, BLOOD (ROUTINE X 2)
Culture: NO GROWTH
Culture: NO GROWTH
Special Requests: ADEQUATE

## 2022-06-12 DIAGNOSIS — I110 Hypertensive heart disease with heart failure: Secondary | ICD-10-CM | POA: Diagnosis not present

## 2022-06-13 DIAGNOSIS — I110 Hypertensive heart disease with heart failure: Secondary | ICD-10-CM | POA: Diagnosis not present

## 2022-06-14 DIAGNOSIS — I110 Hypertensive heart disease with heart failure: Secondary | ICD-10-CM | POA: Diagnosis not present

## 2022-06-15 DIAGNOSIS — R531 Weakness: Secondary | ICD-10-CM | POA: Diagnosis not present

## 2022-06-16 DIAGNOSIS — I110 Hypertensive heart disease with heart failure: Secondary | ICD-10-CM | POA: Diagnosis not present

## 2022-06-27 DIAGNOSIS — I110 Hypertensive heart disease with heart failure: Secondary | ICD-10-CM | POA: Diagnosis not present

## 2022-06-28 DIAGNOSIS — I110 Hypertensive heart disease with heart failure: Secondary | ICD-10-CM | POA: Diagnosis not present

## 2022-07-04 DIAGNOSIS — I110 Hypertensive heart disease with heart failure: Secondary | ICD-10-CM | POA: Diagnosis not present

## 2022-07-06 DIAGNOSIS — I110 Hypertensive heart disease with heart failure: Secondary | ICD-10-CM | POA: Diagnosis not present

## 2022-07-07 ENCOUNTER — Emergency Department (HOSPITAL_COMMUNITY): Payer: Medicaid Other

## 2022-07-07 ENCOUNTER — Encounter (HOSPITAL_COMMUNITY): Payer: Self-pay

## 2022-07-07 ENCOUNTER — Other Ambulatory Visit: Payer: Self-pay

## 2022-07-07 ENCOUNTER — Inpatient Hospital Stay (HOSPITAL_COMMUNITY)
Admission: EM | Admit: 2022-07-07 | Discharge: 2022-07-09 | DRG: 291 | Disposition: A | Discharge status: Home / Self Care | Payer: Medicaid Other | Attending: Internal Medicine | Admitting: Internal Medicine

## 2022-07-07 DIAGNOSIS — Z91148 Patient's other noncompliance with medication regimen for other reason: Secondary | ICD-10-CM

## 2022-07-07 DIAGNOSIS — F418 Other specified anxiety disorders: Secondary | ICD-10-CM

## 2022-07-07 DIAGNOSIS — K219 Gastro-esophageal reflux disease without esophagitis: Secondary | ICD-10-CM | POA: Diagnosis present

## 2022-07-07 DIAGNOSIS — I10 Essential (primary) hypertension: Secondary | ICD-10-CM | POA: Insufficient documentation

## 2022-07-07 DIAGNOSIS — F419 Anxiety disorder, unspecified: Secondary | ICD-10-CM | POA: Diagnosis present

## 2022-07-07 DIAGNOSIS — E1169 Type 2 diabetes mellitus with other specified complication: Secondary | ICD-10-CM | POA: Diagnosis not present

## 2022-07-07 DIAGNOSIS — Z808 Family history of malignant neoplasm of other organs or systems: Secondary | ICD-10-CM | POA: Diagnosis not present

## 2022-07-07 DIAGNOSIS — F32A Depression, unspecified: Secondary | ICD-10-CM | POA: Diagnosis present

## 2022-07-07 DIAGNOSIS — J9622 Acute and chronic respiratory failure with hypercapnia: Secondary | ICD-10-CM | POA: Diagnosis present

## 2022-07-07 DIAGNOSIS — R600 Localized edema: Secondary | ICD-10-CM | POA: Diagnosis not present

## 2022-07-07 DIAGNOSIS — J9621 Acute and chronic respiratory failure with hypoxia: Secondary | ICD-10-CM | POA: Diagnosis present

## 2022-07-07 DIAGNOSIS — M7989 Other specified soft tissue disorders: Principal | ICD-10-CM

## 2022-07-07 DIAGNOSIS — J9611 Chronic respiratory failure with hypoxia: Secondary | ICD-10-CM | POA: Diagnosis present

## 2022-07-07 DIAGNOSIS — R609 Edema, unspecified: Secondary | ICD-10-CM | POA: Diagnosis not present

## 2022-07-07 DIAGNOSIS — R2240 Localized swelling, mass and lump, unspecified lower limb: Secondary | ICD-10-CM | POA: Diagnosis not present

## 2022-07-07 DIAGNOSIS — Z6841 Body Mass Index (BMI) 40.0 and over, adult: Secondary | ICD-10-CM | POA: Diagnosis not present

## 2022-07-07 DIAGNOSIS — J4489 Other specified chronic obstructive pulmonary disease: Secondary | ICD-10-CM | POA: Diagnosis present

## 2022-07-07 DIAGNOSIS — G4710 Hypersomnia, unspecified: Secondary | ICD-10-CM | POA: Diagnosis present

## 2022-07-07 DIAGNOSIS — Z87891 Personal history of nicotine dependence: Secondary | ICD-10-CM

## 2022-07-07 DIAGNOSIS — Z90710 Acquired absence of both cervix and uterus: Secondary | ICD-10-CM | POA: Diagnosis not present

## 2022-07-07 DIAGNOSIS — Z888 Allergy status to other drugs, medicaments and biological substances status: Secondary | ICD-10-CM

## 2022-07-07 DIAGNOSIS — Z79899 Other long term (current) drug therapy: Secondary | ICD-10-CM | POA: Diagnosis not present

## 2022-07-07 DIAGNOSIS — G8929 Other chronic pain: Secondary | ICD-10-CM | POA: Diagnosis present

## 2022-07-07 DIAGNOSIS — M1990 Unspecified osteoarthritis, unspecified site: Secondary | ICD-10-CM | POA: Diagnosis present

## 2022-07-07 DIAGNOSIS — Z79890 Hormone replacement therapy (postmenopausal): Secondary | ICD-10-CM

## 2022-07-07 DIAGNOSIS — I110 Hypertensive heart disease with heart failure: Secondary | ICD-10-CM | POA: Diagnosis not present

## 2022-07-07 DIAGNOSIS — Z91030 Bee allergy status: Secondary | ICD-10-CM

## 2022-07-07 DIAGNOSIS — Z86718 Personal history of other venous thrombosis and embolism: Secondary | ICD-10-CM

## 2022-07-07 DIAGNOSIS — E039 Hypothyroidism, unspecified: Secondary | ICD-10-CM | POA: Diagnosis present

## 2022-07-07 DIAGNOSIS — J9612 Chronic respiratory failure with hypercapnia: Secondary | ICD-10-CM | POA: Diagnosis not present

## 2022-07-07 DIAGNOSIS — Z9981 Dependence on supplemental oxygen: Secondary | ICD-10-CM

## 2022-07-07 DIAGNOSIS — Z833 Family history of diabetes mellitus: Secondary | ICD-10-CM | POA: Diagnosis not present

## 2022-07-07 DIAGNOSIS — G9341 Metabolic encephalopathy: Secondary | ICD-10-CM | POA: Diagnosis present

## 2022-07-07 DIAGNOSIS — E662 Morbid (severe) obesity with alveolar hypoventilation: Secondary | ICD-10-CM | POA: Diagnosis present

## 2022-07-07 DIAGNOSIS — J449 Chronic obstructive pulmonary disease, unspecified: Secondary | ICD-10-CM | POA: Diagnosis present

## 2022-07-07 DIAGNOSIS — E890 Postprocedural hypothyroidism: Secondary | ICD-10-CM | POA: Diagnosis present

## 2022-07-07 DIAGNOSIS — Z743 Need for continuous supervision: Secondary | ICD-10-CM | POA: Diagnosis not present

## 2022-07-07 DIAGNOSIS — E785 Hyperlipidemia, unspecified: Secondary | ICD-10-CM | POA: Diagnosis not present

## 2022-07-07 DIAGNOSIS — J9811 Atelectasis: Secondary | ICD-10-CM | POA: Diagnosis not present

## 2022-07-07 DIAGNOSIS — G43909 Migraine, unspecified, not intractable, without status migrainosus: Secondary | ICD-10-CM | POA: Diagnosis present

## 2022-07-07 DIAGNOSIS — Z85850 Personal history of malignant neoplasm of thyroid: Secondary | ICD-10-CM

## 2022-07-07 DIAGNOSIS — I5033 Acute on chronic diastolic (congestive) heart failure: Secondary | ICD-10-CM | POA: Diagnosis not present

## 2022-07-07 DIAGNOSIS — Z7982 Long term (current) use of aspirin: Secondary | ICD-10-CM

## 2022-07-07 DIAGNOSIS — R0602 Shortness of breath: Secondary | ICD-10-CM | POA: Diagnosis not present

## 2022-07-07 DIAGNOSIS — M109 Gout, unspecified: Secondary | ICD-10-CM | POA: Diagnosis present

## 2022-07-07 DIAGNOSIS — R1900 Intra-abdominal and pelvic swelling, mass and lump, unspecified site: Secondary | ICD-10-CM | POA: Diagnosis not present

## 2022-07-07 DIAGNOSIS — G894 Chronic pain syndrome: Secondary | ICD-10-CM | POA: Diagnosis present

## 2022-07-07 DIAGNOSIS — E119 Type 2 diabetes mellitus without complications: Secondary | ICD-10-CM | POA: Diagnosis present

## 2022-07-07 DIAGNOSIS — J80 Acute respiratory distress syndrome: Secondary | ICD-10-CM | POA: Diagnosis not present

## 2022-07-07 DIAGNOSIS — Z886 Allergy status to analgesic agent status: Secondary | ICD-10-CM

## 2022-07-07 LAB — CBC WITH DIFFERENTIAL/PLATELET
Abs Immature Granulocytes: 0.08 10*3/uL — ABNORMAL HIGH (ref 0.00–0.07)
Basophils Absolute: 0.1 10*3/uL (ref 0.0–0.1)
Basophils Relative: 0 %
Eosinophils Absolute: 0.6 10*3/uL — ABNORMAL HIGH (ref 0.0–0.5)
Eosinophils Relative: 4 %
HCT: 35.6 % — ABNORMAL LOW (ref 36.0–46.0)
Hemoglobin: 10.9 g/dL — ABNORMAL LOW (ref 12.0–15.0)
Immature Granulocytes: 1 %
Lymphocytes Relative: 16 %
Lymphs Abs: 2.3 10*3/uL (ref 0.7–4.0)
MCH: 29.6 pg (ref 26.0–34.0)
MCHC: 30.6 g/dL (ref 30.0–36.0)
MCV: 96.7 fL (ref 80.0–100.0)
Monocytes Absolute: 0.9 10*3/uL (ref 0.1–1.0)
Monocytes Relative: 6 %
Neutro Abs: 10.6 10*3/uL — ABNORMAL HIGH (ref 1.7–7.7)
Neutrophils Relative %: 73 %
Platelets: 247 10*3/uL (ref 150–400)
RBC: 3.68 MIL/uL — ABNORMAL LOW (ref 3.87–5.11)
RDW: 13.2 % (ref 11.5–15.5)
WBC: 14.6 10*3/uL — ABNORMAL HIGH (ref 4.0–10.5)
nRBC: 0 % (ref 0.0–0.2)

## 2022-07-07 LAB — BLOOD GAS, VENOUS
Acid-Base Excess: 11.1 mmol/L — ABNORMAL HIGH (ref 0.0–2.0)
Bicarbonate: 39.5 mmol/L — ABNORMAL HIGH (ref 20.0–28.0)
O2 Saturation: 80.8 %
Patient temperature: 37
pCO2, Ven: 75 mmHg (ref 44–60)
pH, Ven: 7.33 (ref 7.25–7.43)
pO2, Ven: 48 mmHg — ABNORMAL HIGH (ref 32–45)

## 2022-07-07 LAB — COMPREHENSIVE METABOLIC PANEL
ALT: 14 U/L (ref 0–44)
AST: 16 U/L (ref 15–41)
Albumin: 3.7 g/dL (ref 3.5–5.0)
Alkaline Phosphatase: 48 U/L (ref 38–126)
Anion gap: 8 (ref 5–15)
BUN: 12 mg/dL (ref 8–23)
CO2: 32 mmol/L (ref 22–32)
Calcium: 8.5 mg/dL — ABNORMAL LOW (ref 8.9–10.3)
Chloride: 97 mmol/L — ABNORMAL LOW (ref 98–111)
Creatinine, Ser: 0.77 mg/dL (ref 0.44–1.00)
GFR, Estimated: >60 mL/min (ref >60)
Glucose, Bld: 105 mg/dL — ABNORMAL HIGH (ref 70–99)
Potassium: 3.7 mmol/L (ref 3.5–5.1)
Sodium: 137 mmol/L (ref 135–145)
Total Bilirubin: 0.3 mg/dL (ref 0.3–1.2)
Total Protein: 6.6 g/dL (ref 6.5–8.1)

## 2022-07-07 LAB — TROPONIN I (HIGH SENSITIVITY): Troponin I (High Sensitivity): 5 ng/L (ref <18)

## 2022-07-07 LAB — BRAIN NATRIURETIC PEPTIDE: B Natriuretic Peptide: 54.4 pg/mL (ref 0.0–100.0)

## 2022-07-07 MED ORDER — POTASSIUM CHLORIDE CRYS ER 20 MEQ PO TBCR: 20.0000 meq | EXTENDED_RELEASE_TABLET | Freq: Two times a day (BID) | ORAL | Status: DC

## 2022-07-07 MED ORDER — SODIUM CHLORIDE 0.9% FLUSH
3.0000 mL | Freq: Two times a day (BID) | INTRAVENOUS | Status: DC
  Administered 2022-07-08 – 2022-07-09 (×3): 3 mL via INTRAVENOUS

## 2022-07-07 MED ORDER — ACETAMINOPHEN 650 MG RE SUPP: 650.0000 mg | Freq: Four times a day (QID) | RECTAL | Status: DC | PRN

## 2022-07-07 MED ORDER — ROSUVASTATIN CALCIUM 20 MG PO TABS
10.0000 mg | ORAL_TABLET | Freq: Every evening | ORAL | Status: DC
  Administered 2022-07-08: 10 mg via ORAL
  Filled 2022-07-07: qty 1

## 2022-07-07 MED ORDER — ACETAMINOPHEN 325 MG PO TABS
650.0000 mg | ORAL_TABLET | Freq: Four times a day (QID) | ORAL | Status: DC | PRN
  Administered 2022-07-08: 650 mg via ORAL
  Filled 2022-07-07: qty 2

## 2022-07-07 MED ORDER — UMECLIDINIUM-VILANTEROL 62.5-25 MCG/ACT IN AEPB
1.0000 | INHALATION_SPRAY | Freq: Every day | RESPIRATORY_TRACT | Status: DC
  Administered 2022-07-08 – 2022-07-09 (×2): 1 via RESPIRATORY_TRACT
  Filled 2022-07-07: qty 14

## 2022-07-07 MED ORDER — LEVOTHYROXINE SODIUM 112 MCG PO TABS
224.0000 ug | ORAL_TABLET | Freq: Every day | ORAL | Status: DC
  Administered 2022-07-08 – 2022-07-09 (×2): 224 ug via ORAL
  Filled 2022-07-07 (×2): qty 2

## 2022-07-07 MED ORDER — CARVEDILOL 3.125 MG PO TABS
3.1250 mg | ORAL_TABLET | Freq: Two times a day (BID) | ORAL | Status: DC
  Administered 2022-07-08 – 2022-07-09 (×3): 3.125 mg via ORAL
  Filled 2022-07-07 (×3): qty 1

## 2022-07-07 MED ORDER — ONDANSETRON HCL 4 MG/2ML IJ SOLN: 4.0000 mg | Freq: Four times a day (QID) | INTRAMUSCULAR | Status: DC | PRN

## 2022-07-07 MED ORDER — FUROSEMIDE 10 MG/ML IJ SOLN
60.0000 mg | Freq: Two times a day (BID) | INTRAMUSCULAR | Status: DC
  Administered 2022-07-08 – 2022-07-09 (×4): 60 mg via INTRAVENOUS
  Filled 2022-07-07 (×2): qty 6
  Filled 2022-07-07: qty 8
  Filled 2022-07-07: qty 6

## 2022-07-07 MED ORDER — ONDANSETRON HCL 4 MG PO TABS: 4.0000 mg | ORAL_TABLET | Freq: Four times a day (QID) | ORAL | Status: DC | PRN

## 2022-07-07 MED ORDER — IPRATROPIUM-ALBUTEROL 0.5-2.5 (3) MG/3ML IN SOLN: 3.0000 mL | Freq: Four times a day (QID) | RESPIRATORY_TRACT | Status: DC | PRN

## 2022-07-07 MED ORDER — NALOXONE HCL 0.4 MG/ML IJ SOLN: 0.4000 mg | INTRAMUSCULAR | Status: DC | PRN

## 2022-07-07 MED ORDER — SENNOSIDES-DOCUSATE SODIUM 8.6-50 MG PO TABS: 1.0000 | ORAL_TABLET | Freq: Every evening | ORAL | Status: DC | PRN

## 2022-07-07 MED ORDER — ALBUTEROL SULFATE HFA 108 (90 BASE) MCG/ACT IN AERS: 1.0000 | INHALATION_SPRAY | Freq: Four times a day (QID) | RESPIRATORY_TRACT | Status: DC | PRN

## 2022-07-07 MED ORDER — LISINOPRIL 10 MG PO TABS: 5.0000 mg | ORAL_TABLET | Freq: Every day | ORAL | Status: DC

## 2022-07-07 MED ORDER — ENOXAPARIN SODIUM 40 MG/0.4ML IJ SOSY
40.0000 mg | PREFILLED_SYRINGE | INTRAMUSCULAR | Status: DC
  Administered 2022-07-09: 40 mg via SUBCUTANEOUS
  Filled 2022-07-07 (×2): qty 0.4

## 2022-07-07 NOTE — ED Provider Notes (Signed)
[facility] EMERGENCY DEPARTMENT AT [facility] Provider Note   CSN: [id number] Arrival date & time: 07/07/22  1831     History  Chief Complaint  Patient presents with   Leg Swelling    Michele Owens is a 62 y.o. female.with past medical history of OSA, T2DM, HTN, hypothyroid, diastolic CHF, COPD who presents to the emergency department with leg swelling.  Patient is a poor historian. States she is having fluid build up in her legs. She is mostly unable to provide much further history. She appears drowsy or intoxicated. She states she has been taking her torsemide as prescribed. Per EMS, she was complaining of increased abdominal swelling and bilateral leg swelling for 4 days. EMS reported non compliance with meds. On 3L [place] at baseline.   HPI     Home Medications Prior to Admission medications   Medication Sig Start Date End Date Taking? Authorizing Provider  acetaminophen (TYLENOL) 500 MG tablet Take 1,000 mg by mouth every 6 (six) hours as needed for moderate pain or headache.    [provider]  albuterol (PROVENTIL HFA;VENTOLIN HFA) 108 (90 Base) MCG/ACT inhaler Inhale 1-2 puffs into the lungs every 6 (six) hours as needed for wheezing or shortness of breath. 04/23/18   Long, [name], MD  allopurinol (ZYLOPRIM) 100 MG tablet Take 100 mg by mouth daily.    [provider]  aspirin EC 81 MG tablet Take 81 mg by mouth daily. Swallow whole.    [provider]  blood glucose meter kit and supplies KIT Dispense based on patient and insurance preference. Use up to four times daily as directed. 12/25/20   Little Ishikawa, MD  busPIRone (BUSPAR) 5 MG tablet Take 5 mg by mouth 3 (three) times daily.    [provider]  calcitRIOL (ROCALTROL) 0.5 MCG capsule Take 1 capsule (0.5 mcg total) by mouth daily. 09/26/16   Hongalgi, [name], MD  carvedilol (COREG) 3.125 MG tablet Take 1 tablet (3.125 mg total) by mouth 2 (two) times daily with a  meal. 05/18/22 06/17/22  Lucienne Minks, MD  clonazePAM (KLONOPIN) 2 MG tablet Take 1 tablet (2 mg total) by mouth 3 (three) times daily. for anxiety Patient taking differently: Take 2 mg by mouth 3 (three) times daily. 06/23/19   [name], MD  cyclobenzaprine (FLEXERIL) 10 MG tablet Take 10 mg by mouth 3 (three) times daily as needed for muscle spasms. 11/19/19   [provider]  dicyclomine (BENTYL) 20 MG tablet Take 1 tablet (20 mg total) by mouth 2 (two) times daily. 03/09/22   Maudie Flakes, MD  EPINEPHrine 0.3 mg/0.3 mL IJ SOAJ injection Inject 0.3 mg into the muscle once as needed for anaphylaxis.    [provider]  fenofibrate (TRICOR) 145 MG tablet Take 145 mg by mouth daily. 06/07/21   [provider]  FEROSUL 325 (65 Fe) MG tablet Take 325 mg by mouth daily. 03/22/17   [provider]  ipratropium-albuterol (DUONEB) 0.5-2.5 (3) MG/3ML SOLN Take 3 mLs by nebulization every 6 (six) hours as needed (for shortness of breath). 04/05/17   [provider]  levothyroxine (SYNTHROID, LEVOTHROID) 112 MCG tablet Take 224 mcg by mouth daily before breakfast. 07/11/17   [provider]  lisinopril (ZESTRIL) 5 MG tablet Take 1 tablet (5 mg total) by mouth daily. Please hold this medication for three days, please check your blood pressure at home, bring in record for your pcp to review 05/18/22  Lucienne Minks, MD  loperamide (IMODIUM) 2 MG capsule Take 1 capsule (2 mg total) by mouth 4 (four) times daily as needed for diarrhea or loose stools. 03/09/22   Maudie Flakes, MD  loratadine (CLARITIN) 10 MG tablet Take 10 mg by mouth daily as needed for allergies.    [provider]  nitroGLYCERIN (NITROSTAT) 0.4 MG SL tablet Place 1 tablet (0.4 mg total) under the tongue every 5 (five) minutes as needed for chest pain. 06/19/18   [name], MD  nystatin cream (MYCOSTATIN) Apply 1 Application topically 2 (two) times daily as needed for dry  skin. 05/08/22   [provider]  omeprazole (PRILOSEC) 20 MG capsule Take 20 mg by mouth 2 (two) times daily as needed (acid reflux). 05/19/21   [provider]  ondansetron (ZOFRAN) 4 MG tablet Take 4 mg by mouth every 8 (eight) hours as needed for nausea or vomiting. 01/16/22   [provider]  oxyCODONE-acetaminophen (PERCOCET) 10-325 MG tablet Take 1 tablet by mouth every 6 (six) hours as needed for pain. 12/03/19   [provider]  OXYGEN Inhale 3 L into the lungs continuous.    [provider]  phentermine (ADIPEX-P) 37.5 MG tablet Take 37.5 mg by mouth daily. 05/25/21   [provider]  polyethylene glycol (MIRALAX / GLYCOLAX) 17 g packet Take 17 g by mouth daily as needed for mild constipation. 02/03/22   Florencia Reasons, MD  potassium chloride SA (K-DUR,KLOR-CON) 20 MEQ tablet Take 1 tablet (20 mEq total) by mouth 2 (two) times daily. 07/24/18   [name], MD  rosuvastatin (CRESTOR) 10 MG tablet Take 10 mg by mouth every evening. 11/19/19   [provider]  senna-docusate (SENOKOT-S) 8.6-50 MG tablet Take 1 tablet by mouth daily.    [provider]  torsemide (DEMADEX) 20 MG tablet Take 1 tablet (20 mg total) by mouth 2 (two) times daily. 05/18/22 06/17/22  Lucienne Minks, MD  traZODone (DESYREL) 50 MG tablet Take 100 mg by mouth at bedtime. 11/19/19   [provider]  triamcinolone cream (KENALOG) 0.1 % Apply 1 application. topically 2 (two) times daily as needed (irrritation). 09/10/20   [provider]  umeclidinium-vilanterol (ANORO ELLIPTA) 62.5-25 MCG/INH AEPB Inhale 1 puff into the lungs daily.    [provider]  valACYclovir (VALTREX) 1000 MG tablet Take 1,000 mg by mouth daily. 01/19/22   [provider]      Allergies    Bee venom, Fioricet [butalbital-apap-caffeine], Ibuprofen, Lamisil [terbinafine], and Nsaids    Review of Systems   Review of Systems  Cardiovascular:  Positive for  leg swelling.  All other systems reviewed and are negative.   Physical Exam Updated Vital Signs BP 137/69   Pulse 87   Temp 98.2 F (36.8 C)   Resp (!) 21   SpO2 94%  Physical Exam Vitals and nursing note reviewed.  Constitutional:      General: She is not in acute distress.    Appearance: She is obese. She is ill-appearing. She is not toxic-appearing.     Interventions: Nasal cannula in place.  HENT:     Head: Normocephalic.  Eyes:     General: No scleral icterus.    Extraocular Movements: Extraocular movements intact.  Cardiovascular:     Rate and Rhythm: Normal rate and regular rhythm.     Pulses: Normal pulses.     Heart sounds: No murmur heard. Pulmonary:     Effort: Pulmonary effort  is normal. No respiratory distress.     Breath sounds: Decreased air movement present. Decreased breath sounds present.     Comments: Exam limited by habitus. Decreased lung sounds Abdominal:     General: Bowel sounds are normal. There is no distension.     Palpations: Abdomen is soft.     Tenderness: There is no abdominal tenderness.  Musculoskeletal:     Right lower leg: 1+ Pitting Edema present.     Left lower leg: 1+ Pitting Edema present.  Skin:    General: Skin is warm and dry.     Capillary Refill: Capillary refill takes less than 2 seconds.  Neurological:     General: No focal deficit present.     Mental Status: She is alert.     Comments: Drowsy   Psychiatric:        Mood and Affect: Mood normal.        Behavior: Behavior is slowed. Behavior is cooperative.     ED Results / Procedures / Treatments   Labs (all labs ordered are listed, but only abnormal results are displayed) Labs Reviewed  COMPREHENSIVE METABOLIC PANEL - Abnormal; Notable for the following components:      Result Value   Chloride 97 (*)    Glucose, Bld 105 (*)    Calcium 8.5 (*)    All other components within normal limits  CBC WITH DIFFERENTIAL/PLATELET - Abnormal; Notable for the following  components:   WBC 14.6 (*)    RBC 3.68 (*)    Hemoglobin 10.9 (*)    HCT 35.6 (*)    Neutro Abs 10.6 (*)    Eosinophils Absolute 0.6 (*)    Abs Immature Granulocytes 0.08 (*)    All other components within normal limits  BLOOD GAS, VENOUS - Abnormal; Notable for the following components:   pCO2, Ven 75 (*)    pO2, Ven 48 (*)    Bicarbonate 39.5 (*)    Acid-Base Excess 11.1 (*)    All other components within normal limits  BRAIN NATRIURETIC PEPTIDE  TSH  URINALYSIS, ROUTINE W REFLEX MICROSCOPIC  RAPID URINE DRUG SCREEN, HOSP PERFORMED  ETHANOL  MAGNESIUM  CBC  BASIC METABOLIC PANEL  TROPONIN I (HIGH SENSITIVITY)  TROPONIN I (HIGH SENSITIVITY)    EKG EKG Interpretation  Date/Time:  Friday July 07 2022 18:45:46 EDT Ventricular Rate:  83 PR Interval:  169 QRS Duration: 82 QT Interval:  381 QTC Calculation: 448 R Axis:   52 Text Interpretation: Sinus rhythm Low voltage, precordial leads no acute ST/T changes no significant change since Feb 2024 Confirmed by Sherwood Gambler [phone]) on 07/07/2022 7:29:43 PM  Radiology DG Chest Port 1 View  Result Date: 07/07/2022 CLINICAL DATA:  Peripheral edema EXAM: PORTABLE CHEST 1 VIEW COMPARISON:  06/04/2022 FINDINGS: Cardiac shadow is enlarged but stable. Aortic calcifications are seen. Increased central vascular congestion is noted when compare with the prior exam. No significant edema is seen. No focal confluent infiltrate is noted. Platelike atelectasis is noted in the bases bilaterally. IMPRESSION: Mild increased vascular congestion when compare with the prior exam. Electronically Signed   By: Inez Catalina M.D.   On: 07/07/2022 19:49    Procedures Procedures   Medications Ordered in ED Medications  naloxone (NARCAN) injection 0.4 mg (has no administration in time range)  furosemide (LASIX) injection 60 mg (has no administration in time range)  enoxaparin (LOVENOX) injection 40 mg (has no administration in time range)  sodium  chloride flush (NS) 0.9 %  injection 3 mL (has no administration in time range)  acetaminophen (TYLENOL) tablet 650 mg (has no administration in time range)    Or  acetaminophen (TYLENOL) suppository 650 mg (has no administration in time range)  ondansetron (ZOFRAN) tablet 4 mg (has no administration in time range)    Or  ondansetron (ZOFRAN) injection 4 mg (has no administration in time range)  senna-docusate (Senokot-S) tablet 1 tablet (has no administration in time range)    ED Course/ Medical Decision Making/ A&P Clinical Course as of 07/07/22 2249  Fri Jul 07, 2022  2128 Spoke with hospitalist who will come see and admit patient.  [LA]    Clinical Course User Index [LA] Mickie Hillier, PA-C   {    Medical Decision Making Amount and/or Complexity of Data Reviewed Labs: ordered. Radiology: ordered.  Risk Decision regarding hospitalization.  Initial Impression and Ddx 61 year old female here with lower extremity swelling  Patient PMH that increases complexity of ED encounter:  dHF, COPD, asthma, DM  Interpretation of Diagnostics I independent reviewed and interpreted the labs as followed: wbc 14.6, pCO2 75, cmp wnl, troponin negative, bnp negative tsh pending   - I independently visualized the following imaging with scope of interpretation limited to determining acute life threatening conditions related to emergency care: cxr, which revealed mild congestion  Patient Reassessment and Ultimate Disposition/Management Chronically ill-appearing, obese 62 year old female who presents with lower extremity swelling.  She is very drowsy on exam, eyes are almost rolled back as she is talking.  She is slowed.  States that she takes OxyContin at home.  Complaining of lower extremity edema.  She is denying chest pain or shortness of breath.  Will obtain labs and chest x-ray and venous gas as well as a BNP.  Satting 97% on home 3 L nasal cannula.  pCO2 75. pH is WNL and bicarb is mildly  elevated. She is drowsy on presentation although she communicates okay. Will place her on BiPAP to optimize her.  CXR with mild vascular congestion.  BNP 54. She may have a component of CHF exacerbation here given that she is clinically with lower extremity edema, vas congestion on CXR despite BNP being normal. ?compliance on her torsemide at home.   EKG without ischemia or infarction, troponin initially negative. Pending delta troponin.   Work up to this point consistent with acute hypercarbic respiratory failure likely 2/2 COPD. Requiring BiPAP. Protecting her airway and she has drive. Not hypoxic. Will require admission for optimization. She may also benefit from IV diuretics. May be an underlying polypharmacy with home oxycontin driving her hypercarbia. Do not think she would benefit from narcan at this time. She is awake and talking without hypoxia.   Consulted and spoke with Dr. Posey Pronto, hospitalist, who agrees to admit patient.   Patient management required discussion with the following services or consulting groups:  Hospitalist Service  Complexity of Problems Addressed Acute complicated illness or Injury  Additional Data Reviewed and Analyzed Further history obtained from: EMS on arrival, Past medical history and medications listed in the EMR, Prior ED visit notes, Recent PCP notes, and Care Everywhere  Patient Encounter Risk Assessment SDOH impact on management, Use of parenteral controlled substances, and Consideration of hospitalization  Final Clinical Impression(s) / ED Diagnoses Final diagnoses:  Leg swelling    Rx / DC Orders ED Discharge Orders     None         Mickie Hillier, PA-C 07/07/22 2249    Sherwood Gambler, MD  07/08/22 2338  

## 2022-07-07 NOTE — Hospital Course (Signed)
Michele Owens is a 62 y.o. female with medical history significant for COPD, chronic respiratory failure with hypoxia on 3 L O2 via [place], HFpEF (EF 55-60%), T2DM, HTN, HLD, hypothyroidism, depression/anxiety, morbid obesity who presented to the hospital with worsening lower extremity edema, abdominal distention, and confusion. On workup she was found to have hypercarbic respiratory failure, pCO2 75.  She was placed on BiPAP and started on IV Lasix. CXR showed increased vascular congestion compared with prior x-ray. She diuresed well with Lasix.  She ambulated and maintained adequate saturations on 2 L. Home regimen was recommended to be resumed of her torsemide at discharge.  Patient stated she was only taking torsemide once daily at home instead of twice daily as prescribed.

## 2022-07-07 NOTE — H&P (Signed)
History and Physical    Jonny Sylve [id number] DOB: [birth year] DOA: 07/07/2022  PCP: [name], MD  Patient coming from: Home  I have personally briefly reviewed patient's old medical records in [place]  Chief Complaint: Shortness of breath, edema  HPI: Michele Owens is a 62 y.o. female with medical history significant for COPD, chronic respiratory failure with hypoxia on 3 L O2 via [place], HFpEF (EF 55-60%), T2DM, HTN, HLD, hypothyroidism, depression/anxiety, morbid obesity who presented to the ED for evaluation of shortness of breath and swelling in her legs and abdomen.  History is limited from patient due to drowsiness and is otherwise supplemented by EDP and chart review.  Patient is drowsy and will intermittently awake to participate in conversation.  She has been having bilateral lower extremity and abdominal swelling for 4 days.  She reports decreased urine output than expected.  It is unclear if she has been adherent to her medications including her diuretics. She is prescribed Percocet and Klonopin chronically which may be contributing to her hypersomnolence.  ED Course  Labs/Imaging on admission: I have personally reviewed following labs and imaging studies.  Initial vitals showed BP 132/77, pulse 84, RR 18, temp 98.2 F, SpO2 96% on BiPAP.  Labs show WBC 14.6, hemoglobin 10.9, platelets 247,000, sodium 137, potassium 3.7, bicarb 32, BUN 12, creatinine 0.77, serum glucose 105, BNP 54.4.  VBG shows pH 7.33, pCO2 75, pO2 48.  Portable chest x-ray shows enlarged cardiac silhouette, increased vascular congestion compared to previous.  No focal consolidation.  The hospitalist service was consulted to admit for further evaluation and management.  Review of Systems: All systems reviewed and are negative except as documented in history of present illness above.   Past Medical History:  Diagnosis Date   Anxiety    Arthritis    Asthma    Chronic diastolic CHF  (congestive heart failure) (HCC)    COPD (chronic obstructive pulmonary disease) (HCC)    Uses Oxygen at night   Depression    Diabetes mellitus without complication (HCC)    GERD (gastroesophageal reflux disease)    Gout    Headache    migraines   History of DVT of lower extremity    Hypertension    Thyroid cancer ([place]) 09/23/2016    Past Surgical History:  Procedure Laterality Date   BUNIONECTOMY Bilateral    CARDIAC CATHETERIZATION N/A 06/23/2015   Procedure: Right/Left Heart Cath and Coronary Angiography;  Surgeon: [name], MD;  Location: [facility];  Service: Cardiovascular;  Laterality: N/A;   COLONOSCOPY WITH PROPOFOL N/A 12/15/2015   Procedure: COLONOSCOPY WITH PROPOFOL;  Surgeon: [name], MD;  Location: [facility];  Service: Endoscopy;  Laterality: N/A;   RIGHT HEART CATH N/A 10/01/2019   Procedure: RIGHT HEART CATH;  Surgeon: [name], MD;  Location: [facility];  Service: Cardiovascular;  Laterality: N/A;   THYROIDECTOMY  09/19/2016   THYROIDECTOMY N/A 09/19/2016   Procedure: TOTAL THYROIDECTOMY;  Surgeon: [name], MD;  Location: [facility];  Service: General;  Laterality: N/A;   TONSILLECTOMY     TOTAL ABDOMINAL HYSTERECTOMY  07/14/10    Social History:  reports that she quit smoking about 5 years ago. Her smoking use included cigarettes. She has a 20.50 pack-year smoking history. She has never used smokeless tobacco. She reports that she does not currently use alcohol. She reports that she does not currently use drugs after having used the following drugs: Marijuana and Cocaine.  Allergies  Allergen Reactions   Bee Venom Swelling and Other (See Comments)    "All over my body" (swelling)   Fioricet [Butalbital-Apap-Caffeine] Nausea And Vomiting and Rash   Ibuprofen Rash and Other (See Comments)    Severe rash   Lamisil [Terbinafine] Rash and Other (See Comments)    Pt states this causes her to "feel funny"   Nsaids Other (See  Comments)    Per MD's orders     Family History  Problem Relation Age of Onset   Cancer Father        thought to be due to exposure to concrete   Diabetes Mother    Thyroid cancer Mother        dx in her 64s-60s   Cancer Maternal Uncle        2 uncles with cancer NOS   Brain cancer Paternal Aunt    Cancer Cousin        maternal first cousin - NOS   Cancer Cousin        maternal first cousin - NOS     Prior to Admission medications   Medication Sig Start Date End Date Taking? Authorizing Provider  acetaminophen (TYLENOL) 500 MG tablet Take 1,000 mg by mouth every 6 (six) hours as needed for moderate pain or headache.    [provider]  albuterol (PROVENTIL HFA;VENTOLIN HFA) 108 (90 Base) MCG/ACT inhaler Inhale 1-2 puffs into the lungs every 6 (six) hours as needed for wheezing or shortness of breath. 04/23/18   Long, [name], MD  allopurinol (ZYLOPRIM) 100 MG tablet Take 100 mg by mouth daily.    [provider]  aspirin EC 81 MG tablet Take 81 mg by mouth daily. Swallow whole.    [provider]  blood glucose meter kit and supplies KIT Dispense based on patient and insurance preference. Use up to four times daily as directed. 12/25/20   Little Ishikawa, MD  busPIRone (BUSPAR) 5 MG tablet Take 5 mg by mouth 3 (three) times daily.    [provider]  calcitRIOL (ROCALTROL) 0.5 MCG capsule Take 1 capsule (0.5 mcg total) by mouth daily. 09/26/16   Hongalgi, [name], MD  carvedilol (COREG) 3.125 MG tablet Take 1 tablet (3.125 mg total) by mouth 2 (two) times daily with a meal. 05/18/22 06/17/22  Lucienne Minks, MD  clonazePAM (KLONOPIN) 2 MG tablet Take 1 tablet (2 mg total) by mouth 3 (three) times daily. for anxiety Patient taking differently: Take 2 mg by mouth 3 (three) times daily. 06/23/19   [name], MD  cyclobenzaprine (FLEXERIL) 10 MG tablet Take 10 mg by mouth 3 (three) times daily as needed for muscle spasms. 11/19/19   [provider]  dicyclomine (BENTYL) 20 MG tablet Take 1 tablet (20 mg total) by mouth 2 (two) times daily. 03/09/22   Maudie Flakes, MD  EPINEPHrine 0.3 mg/0.3 mL IJ SOAJ injection Inject 0.3 mg into the muscle once as needed for anaphylaxis.    [provider]  fenofibrate (TRICOR) 145 MG tablet Take 145 mg by mouth daily. 06/07/21   [provider]  FEROSUL 325 (65 Fe) MG tablet Take 325 mg by mouth daily. 03/22/17   [provider]  ipratropium-albuterol (DUONEB) 0.5-2.5 (3) MG/3ML SOLN Take 3 mLs by nebulization every 6 (six) hours as needed (for shortness of breath). 04/05/17   [provider]  levothyroxine (SYNTHROID, LEVOTHROID) 112 MCG tablet Take 224 mcg by mouth daily before breakfast. 07/11/17  [provider]  lisinopril (ZESTRIL) 5 MG tablet Take 1 tablet (5 mg total) by mouth daily. Please hold this medication for three days, please check your blood pressure at home, bring in record for your pcp to review 05/18/22   Lucienne Minks, MD  loperamide (IMODIUM) 2 MG capsule Take 1 capsule (2 mg total) by mouth 4 (four) times daily as needed for diarrhea or loose stools. 03/09/22   Maudie Flakes, MD  loratadine (CLARITIN) 10 MG tablet Take 10 mg by mouth daily as needed for allergies.    [provider]  nitroGLYCERIN (NITROSTAT) 0.4 MG SL tablet Place 1 tablet (0.4 mg total) under the tongue every 5 (five) minutes as needed for chest pain. 06/19/18   [name], MD  nystatin cream (MYCOSTATIN) Apply 1 Application topically 2 (two) times daily as needed for dry skin. 05/08/22   [provider]  omeprazole (PRILOSEC) 20 MG capsule Take 20 mg by mouth 2 (two) times daily as needed (acid reflux). 05/19/21   [provider]  ondansetron (ZOFRAN) 4 MG tablet Take 4 mg by mouth every 8 (eight) hours as needed for nausea or vomiting. 01/16/22   [provider]  oxyCODONE-acetaminophen (PERCOCET) 10-325 MG tablet Take 1  tablet by mouth every 6 (six) hours as needed for pain. 12/03/19   [provider]  OXYGEN Inhale 3 L into the lungs continuous.    [provider]  phentermine (ADIPEX-P) 37.5 MG tablet Take 37.5 mg by mouth daily. 05/25/21   [provider]  polyethylene glycol (MIRALAX / GLYCOLAX) 17 g packet Take 17 g by mouth daily as needed for mild constipation. 02/03/22   Florencia Reasons, MD  potassium chloride SA (K-DUR,KLOR-CON) 20 MEQ tablet Take 1 tablet (20 mEq total) by mouth 2 (two) times daily. 07/24/18   [name], MD  rosuvastatin (CRESTOR) 10 MG tablet Take 10 mg by mouth every evening. 11/19/19   [provider]  senna-docusate (SENOKOT-S) 8.6-50 MG tablet Take 1 tablet by mouth daily.    [provider]  torsemide (DEMADEX) 20 MG tablet Take 1 tablet (20 mg total) by mouth 2 (two) times daily. 05/18/22 06/17/22  Lucienne Minks, MD  traZODone (DESYREL) 50 MG tablet Take 100 mg by mouth at bedtime. 11/19/19   [provider]  triamcinolone cream (KENALOG) 0.1 % Apply 1 application. topically 2 (two) times daily as needed (irrritation). 09/10/20   [provider]  umeclidinium-vilanterol (ANORO ELLIPTA) 62.5-25 MCG/INH AEPB Inhale 1 puff into the lungs daily.    [provider]  valACYclovir (VALTREX) 1000 MG tablet Take 1,000 mg by mouth daily. 01/19/22   [provider]    Physical Exam: Vitals:   07/07/22 2200 07/07/22 2230 07/07/22 2240 07/07/22 2245  BP: (!) 140/79 137/69    Pulse: 86 86  87  Resp: 20 20  (!) 21  Temp:   98.2 F (36.8 C)   TempSrc:      SpO2: 96% 97%  94%  Exam limited due to hypersomnolence. Constitutional: Morbidly obese woman resting in bed with head elevated currently wearing BiPAP.  Somnolent, will briefly awaken to answer questions before falling back asleep. Eyes: EOMI, lids and conjunctivae normal ENMT: Mucous membranes are moist. Posterior pharynx clear of any exudate or lesions.Normal  dentition.  Neck: normal, supple, no masses. Respiratory: clear to auscultation anteriorly, patient unable to change position to listen posteriorly. Normal respiratory effort while on BiPAP. No accessory muscle use.  Cardiovascular:  Regular rate and rhythm, no murmurs / rubs / gallops.  +2 bilateral lower extremity edema. 2+ pedal pulses. Abdomen: no tenderness, no masses palpated.  Musculoskeletal: no clubbing / cyanosis. No joint deformity upper and lower extremities. Good ROM, no contractures. Normal muscle tone.  Skin: no rashes, lesions, ulcers. No induration Neurologic:  Sensation intact. Strength equal bilaterally. Psychiatric: Hypersomnolent, intermittently awakens to answer questions and is oriented x 3 but otherwise limited historian.  EKG: Personally reviewed. Sinus rhythm, rate 83, no acute ischemic changes.  Rate is slower when compared to prior.  Assessment/Plan Principal Problem:   Chronic respiratory failure with hypoxia and hypercapnia (HCC) Active Problems:   Controlled type 2 diabetes mellitus without complication, without long-term current use of insulin (HCC)   Dyslipidemia associated with type 2 diabetes mellitus ([place])   Depression with anxiety   Chronic pain disorder   Acute on chronic heart failure with preserved ejection fraction (HFpEF) (HCC)   COPD (chronic obstructive pulmonary disease) (HCC)   Acute metabolic encephalopathy   Essential hypertension   Obesity, Class III, BMI 40-49.9 (morbid obesity) ([place])   Hypothyroidism   Omesha Deshotel is a 62 y.o. female with medical history significant for COPD, chronic respiratory failure with hypoxia on 3 L O2 via [place], HFpEF (EF 55-60%), T2DM, HTN, HLD, hypothyroidism, depression/anxiety, morbid obesity who is admitted with acute on chronic respiratory failure with hypoxia and hypercarbia and encephalopathy.  Assessment and Plan: Chronic respiratory failure with hypoxia and hypercarbia: Likely multifactorial from acute  on chronic HFpEF plus potentially effect from multiple sedating medications resulted in CO2 retention.  Also concerned about over oxygenation decreasing respiratory drive.  Uses 3 L O2 [place] chronically at baseline.  She has not had any documented hypoxia at time of admission.  She was placed on BiPAP in the ED. -Trial off BiPAP -Titrate supplemental O2 to target SpO2 88-92% -Start diuresis with IV Lasix -Hold Percocet, Klonopin, other sedating meds for now.  Resume when able as she has been on narcotic and benzo chronically.  Acute metabolic encephalopathy: Suspect CO2 narcosis secondary to sedating medications, over oxygenation, and CHF.  Management as above.  Acute on chronic HFpEF: Has significant peripheral edema with some pulmonary edema on CXR.  BNP is low but not entirely reliable due to body habitus.  Unclear if she has been adherent to her torsemide. -IV Lasix 60 mg twice daily -Strict I/O's and daily weights -Continue Coreg 3.125 mg twice daily  COPD: No obvious wheezing or evidence of acute exacerbation.  Continue Anoro Ellipta, albuterol and DuoNebs as needed.  Hypertension: Continue Coreg and lisinopril.  Type 2 diabetes: Diet controlled, last A1c 6.1%.  Hypothyroidism: Continue Synthroid.  Chronic pain: Holding home Percocet due to excess drowsiness.  Depression/anxiety: Holding Klonopin, trazodone due to excess drowsiness.   DVT prophylaxis: enoxaparin (LOVENOX) injection 40 mg Start: 07/08/22 1000 Code Status: Full code Family Communication: None present on admission. Disposition Plan: From home, dispo pending clinical progress Consults called: None Severity of Illness: The appropriate patient status for this patient is INPATIENT. Inpatient status is judged to be reasonable and necessary in order to provide the required intensity of service to ensure the patient's safety. The patient's presenting symptoms, physical exam findings, and initial radiographic and  laboratory data in the context of their chronic comorbidities is felt to place them at high risk for further clinical deterioration. Furthermore, it is not anticipated that the patient will be medically stable for discharge from the hospital within 2 midnights of admission.   *  I certify that at the point of admission it is my clinical judgment that the patient will require inpatient hospital care spanning beyond 2 midnights from the point of admission due to high intensity of service, high risk for further deterioration and high frequency of surveillance required.Zada Finders MD [facility]  If 7PM-7AM, please contact night-coverage [email or web address]  07/07/2022, 10:59 PM

## 2022-07-07 NOTE — ED Triage Notes (Signed)
Pt coming from home via EMS with c/o increased abdominal swelling and bilateral leg swelling x4 days. Per EMS patient has not been completely compliant with home medications. 3 L O2 [place] baseline.

## 2022-07-08 DIAGNOSIS — I5033 Acute on chronic diastolic (congestive) heart failure: Secondary | ICD-10-CM

## 2022-07-08 DIAGNOSIS — G9341 Metabolic encephalopathy: Secondary | ICD-10-CM | POA: Diagnosis not present

## 2022-07-08 DIAGNOSIS — J9612 Chronic respiratory failure with hypercapnia: Secondary | ICD-10-CM | POA: Diagnosis not present

## 2022-07-08 DIAGNOSIS — J9611 Chronic respiratory failure with hypoxia: Secondary | ICD-10-CM | POA: Diagnosis not present

## 2022-07-08 LAB — GLUCOSE, CAPILLARY
Glucose-Capillary: 121 mg/dL — ABNORMAL HIGH (ref 70–99)
Glucose-Capillary: 171 mg/dL — ABNORMAL HIGH (ref 70–99)

## 2022-07-08 LAB — ETHANOL: Alcohol, Ethyl (B): <10 mg/dL (ref <10)

## 2022-07-08 LAB — URINALYSIS, ROUTINE W REFLEX MICROSCOPIC
Bilirubin Urine: NEGATIVE
Glucose, UA: NEGATIVE mg/dL
Hgb urine dipstick: NEGATIVE
Ketones, ur: NEGATIVE mg/dL
Leukocytes,Ua: NEGATIVE
Nitrite: NEGATIVE
Protein, ur: NEGATIVE mg/dL
Specific Gravity, Urine: 1.005 (ref 1.005–1.030)
pH: 7 (ref 5.0–8.0)

## 2022-07-08 LAB — RAPID URINE DRUG SCREEN, HOSP PERFORMED
Amphetamines: NOT DETECTED
Barbiturates: POSITIVE — AB
Benzodiazepines: NOT DETECTED
Cocaine: NOT DETECTED
Opiates: NOT DETECTED
Tetrahydrocannabinol: NOT DETECTED

## 2022-07-08 LAB — BASIC METABOLIC PANEL
Anion gap: 9 (ref 5–15)
BUN: 11 mg/dL (ref 8–23)
CO2: 31 mmol/L (ref 22–32)
Calcium: 8.8 mg/dL — ABNORMAL LOW (ref 8.9–10.3)
Chloride: 97 mmol/L — ABNORMAL LOW (ref 98–111)
Creatinine, Ser: 0.71 mg/dL (ref 0.44–1.00)
GFR, Estimated: >60 mL/min (ref >60)
Glucose, Bld: 128 mg/dL — ABNORMAL HIGH (ref 70–99)
Potassium: 3.6 mmol/L (ref 3.5–5.1)
Sodium: 137 mmol/L (ref 135–145)

## 2022-07-08 LAB — TROPONIN I (HIGH SENSITIVITY): Troponin I (High Sensitivity): 5 ng/L (ref <18)

## 2022-07-08 LAB — MAGNESIUM: Magnesium: 1.9 mg/dL (ref 1.7–2.4)

## 2022-07-08 LAB — TSH: TSH: 0.631 u[IU]/mL (ref 0.350–4.500)

## 2022-07-08 MED ORDER — CLONAZEPAM 1 MG PO TABS
2.0000 mg | ORAL_TABLET | Freq: Three times a day (TID) | ORAL | Status: DC
  Administered 2022-07-08 – 2022-07-09 (×3): 2 mg via ORAL
  Filled 2022-07-08 (×3): qty 2

## 2022-07-08 MED ORDER — INSULIN ASPART 100 UNIT/ML IJ SOLN: 0.0000 [IU] | Freq: Every day | INTRAMUSCULAR | Status: DC

## 2022-07-08 MED ORDER — BUSPIRONE HCL 5 MG PO TABS
5.0000 mg | ORAL_TABLET | Freq: Three times a day (TID) | ORAL | Status: DC
  Administered 2022-07-08 – 2022-07-09 (×3): 5 mg via ORAL
  Filled 2022-07-08 (×3): qty 1

## 2022-07-08 MED ORDER — INSULIN ASPART 100 UNIT/ML IJ SOLN
0.0000 [IU] | Freq: Three times a day (TID) | INTRAMUSCULAR | Status: DC
  Administered 2022-07-09 (×2): 2 [IU] via SUBCUTANEOUS

## 2022-07-08 MED ORDER — MELATONIN 3 MG PO TABS
3.0000 mg | ORAL_TABLET | Freq: Once | ORAL | Status: AC
  Administered 2022-07-08: 3 mg via ORAL
  Filled 2022-07-08: qty 1

## 2022-07-08 MED ORDER — ALLOPURINOL 100 MG PO TABS
100.0000 mg | ORAL_TABLET | Freq: Every day | ORAL | Status: DC
  Administered 2022-07-08 – 2022-07-09 (×2): 100 mg via ORAL
  Filled 2022-07-08 (×2): qty 1

## 2022-07-08 MED ORDER — DICYCLOMINE HCL 20 MG PO TABS
20.0000 mg | ORAL_TABLET | Freq: Two times a day (BID) | ORAL | Status: DC
  Administered 2022-07-08 – 2022-07-09 (×2): 20 mg via ORAL
  Filled 2022-07-08 (×3): qty 1

## 2022-07-08 MED ORDER — CYCLOBENZAPRINE HCL 10 MG PO TABS: 10.0000 mg | ORAL_TABLET | Freq: Three times a day (TID) | ORAL | Status: DC | PRN

## 2022-07-08 MED ORDER — OXYCODONE HCL 5 MG PO TABS
5.0000 mg | ORAL_TABLET | Freq: Four times a day (QID) | ORAL | Status: DC | PRN
  Administered 2022-07-08: 5 mg via ORAL
  Filled 2022-07-08: qty 1

## 2022-07-08 MED ORDER — TRAZODONE HCL 100 MG PO TABS
100.0000 mg | ORAL_TABLET | Freq: Every day | ORAL | Status: DC
  Administered 2022-07-08: 100 mg via ORAL
  Filled 2022-07-08: qty 1

## 2022-07-08 MED ORDER — OXYCODONE-ACETAMINOPHEN 10-325 MG PO TABS: 1.0000 | ORAL_TABLET | Freq: Four times a day (QID) | ORAL | Status: DC | PRN

## 2022-07-08 MED ORDER — OXYCODONE-ACETAMINOPHEN 5-325 MG PO TABS
1.0000 | ORAL_TABLET | Freq: Four times a day (QID) | ORAL | Status: DC | PRN
  Administered 2022-07-09: 1 via ORAL
  Filled 2022-07-08: qty 1

## 2022-07-08 MED ORDER — ASPIRIN 81 MG PO TBEC
81.0000 mg | DELAYED_RELEASE_TABLET | Freq: Every day | ORAL | Status: DC
  Administered 2022-07-08 – 2022-07-09 (×2): 81 mg via ORAL
  Filled 2022-07-08 (×2): qty 1

## 2022-07-08 NOTE — Assessment & Plan Note (Signed)
Continue Crestor 

## 2022-07-08 NOTE — Progress Notes (Signed)
RT Note: BiPAP not in Room/PRN

## 2022-07-08 NOTE — Assessment & Plan Note (Signed)
-  Continue Coreg 

## 2022-07-08 NOTE — Assessment & Plan Note (Addendum)
-  Criteria includes pulmonary rales/crackles, pulmonary edema, bilateral leg edema, dyspnea on exertion -BNP suspected to be falsely low in setting of morbid obesity although BNP value 54 is significantly higher than her prior values -Notably she has worsening dyspnea with associated volume overload in her arms, legs, and abdomen -Patient only compliant with torsemide once a day; will resume at twice daily dosing at discharge - doesn't appear to have established with cardiology? Will repeat referral - had clinical improvement of LE edema and no further DOE when ambulating

## 2022-07-08 NOTE — Assessment & Plan Note (Signed)
-   continue klonopin; verified on database

## 2022-07-08 NOTE — Assessment & Plan Note (Signed)
Continue Synthroid °

## 2022-07-08 NOTE — Assessment & Plan Note (Signed)
-   No signs/symptoms of exacerbation - Continue as needed DuoNebs - Continue Anoro Ellipta

## 2022-07-08 NOTE — Assessment & Plan Note (Signed)
-   on 3L at home -Suspect multifactorial component with underlying OHS and OSA -Perform walk test prior to discharge and may not need 3 L.  Sat goal greater than 88% - patient recommended for 2L at discharge after walk test

## 2022-07-08 NOTE — Assessment & Plan Note (Signed)
-   patient symptoms include confusion - etiology considered due to hypercarbic respiratory failure - [place] now back to normal

## 2022-07-08 NOTE — Assessment & Plan Note (Signed)
-   Complicates overall prognosis and care - Body mass index is 46.85 kg/m.

## 2022-07-08 NOTE — Progress Notes (Signed)
Progress Note    Michele Owens   [id number]  DOB: 1960-11-28  DOA: 07/07/2022     1 PCP: [name], MD  Initial CC: SOB, swelling  Hospital Course: Michele Owens is a 62 y.o. female with medical history significant for COPD, chronic respiratory failure with hypoxia on 3 L O2 via [place], HFpEF (EF 55-60%), T2DM, HTN, HLD, hypothyroidism, depression/anxiety, morbid obesity who presented to the hospital with worsening lower extremity edema, abdominal distention, and confusion. On workup she was found to have hypercarbic respiratory failure, pCO2 75.  She was placed on BiPAP and started on IV Lasix. CXR showed increased vascular congestion compared with prior x-ray.  Interval History:  Sitting on bedside commode when seen this morning.  Still having lower extremity edema but notes her breathing has improved.  She was hungry and ready for diet.  Liberated from BiPAP and back on 3 L baseline oxygen currently.  Assessment and Plan: * Chronic respiratory failure with hypoxia and hypercapnia (HCC) - on 3L at home -Suspect multifactorial component with underlying OHS and OSA -Perform walk test prior to discharge and may not need 3 L.  Sat goal greater than 88%  Acute on chronic diastolic (congestive) heart failure (HCC) -Criteria includes pulmonary rales/crackles, pulmonary edema, bilateral leg edema, dyspnea on exertion -BNP suspected to be falsely low in setting of morbid obesity although BNP value 54 is significantly higher than her prior values -Notably she has worsening dyspnea with associated volume overload in her arms, legs, and abdomen -Patient only compliant with torsemide once a day; will resume at twice daily dosing at discharge - Continue IV Lasix for now - Strict I&O -Liberated from BiPAP.  Continue home oxygen.  Acute metabolic encephalopathy-resolved as of 07/08/2022 - patient symptoms include confusion - etiology considered due to hypercarbic respiratory failure -  Mentation now back to normal   COPD (chronic obstructive pulmonary disease) (HCC) - No signs/symptoms of exacerbation - Continue as needed DuoNebs - Continue Anoro Ellipta  Chronic pain disorder - Verified on database.  Takes Percocet 10-325 q6prn (last fill 3/18) - okay to resume  Hypothyroidism - Continue Synthroid  Obesity, Class III, BMI 40-49.9 (morbid obesity) ([place]) - Complicates overall prognosis and care - Body mass index is 46.85 kg/m.  Essential hypertension - Continue Coreg  Depression with anxiety - continue klonopin; verified on database   Controlled type 2 diabetes mellitus without complication, without long-term current use of insulin (HCC) - Last A1c 6.1% on 02/01/2022 - Continue SSI and CBG monitoring  Dyslipidemia associated with type 2 diabetes mellitus ([place]) - Continue Crestor    Old records reviewed in assessment of this patient  Antimicrobials:   DVT prophylaxis:  enoxaparin (LOVENOX) injection 40 mg Start: 07/08/22 1000   Code Status:   Code Status: Full Code  Mobility Assessment (last 72 hours)     Mobility Assessment     Row Name 07/08/22 0303           Does patient have an order for bedrest or is patient medically unstable No - Continue assessment       What is the highest level of mobility based on the progressive mobility assessment? Level 4 (Walks with assist in room) - Balance while marching in place and cannot step forward and back - Complete                Barriers to discharge: none Disposition Plan:  Home 1-2 days Status is: Inpt  Objective: Blood pressure 138/76, pulse  84, temperature 98 F (36.7 C), temperature source Oral, resp. rate 18, height 5\' 5"  (1.651 m), weight 127.7 kg, SpO2 96 %.  Examination:  Physical Exam Constitutional:      Appearance: Normal appearance. She is obese.  HENT:     Head: Normocephalic and atraumatic.     Mouth/Throat:     Mouth: Mucous membranes are moist.  Eyes:      Extraocular Movements: Extraocular movements intact.  Cardiovascular:     Rate and Rhythm: Normal rate and regular rhythm.  Pulmonary:     Effort: Pulmonary effort is normal. No respiratory distress.     Breath sounds: Normal breath sounds. No wheezing.  Abdominal:     General: Bowel sounds are normal. There is no distension.     Palpations: Abdomen is soft.     Tenderness: There is no abdominal tenderness.  Musculoskeletal:        General: Swelling (2+ B/L LE edema) present. Normal range of motion.     Cervical back: Neck supple.  Skin:    General: Skin is warm and dry.  Neurological:     General: No focal deficit present.     Mental Status: She is alert.  Psychiatric:        Mood and Affect: Mood normal.        Behavior: Behavior normal.      Consultants:    Procedures:    Data Reviewed: Results for orders placed or performed during the hospital encounter of 07/07/22 (from the past 24 hour(s))  Comprehensive metabolic panel     Status: Abnormal   Collection Time: 07/07/22  7:30 PM  Result Value Ref Range   Sodium 137 135 - 145 mmol/L   Potassium 3.7 3.5 - 5.1 mmol/L   Chloride 97 (L) 98 - 111 mmol/L   CO2 32 22 - 32 mmol/L   Glucose, Bld 105 (H) 70 - 99 mg/dL   BUN 12 8 - 23 mg/dL   Creatinine, Ser 0.77 0.44 - 1.00 mg/dL   Calcium 8.5 (L) 8.9 - 10.3 mg/dL   Total Protein 6.6 6.5 - 8.1 g/dL   Albumin 3.7 3.5 - 5.0 g/dL   AST 16 15 - 41 U/L   ALT 14 0 - 44 U/L   Alkaline Phosphatase 48 38 - 126 U/L   Total Bilirubin 0.3 0.3 - 1.2 mg/dL   GFR, Estimated >60 >60 mL/min   Anion gap 8 5 - 15  Brain natriuretic peptide     Status: None   Collection Time: 07/07/22  7:30 PM  Result Value Ref Range   B Natriuretic Peptide 54.4 0.0 - 100.0 pg/mL  Troponin I (High Sensitivity)     Status: None   Collection Time: 07/07/22  7:30 PM  Result Value Ref Range   Troponin I (High Sensitivity) 5 <18 ng/L  CBC with Differential     Status: Abnormal   Collection Time: 07/07/22   7:30 PM  Result Value Ref Range   WBC 14.6 (H) 4.0 - 10.5 K/uL   RBC 3.68 (L) 3.87 - 5.11 MIL/uL   Hemoglobin 10.9 (L) 12.0 - 15.0 g/dL   HCT 35.6 (L) 36.0 - 46.0 %   MCV 96.7 80.0 - 100.0 fL   MCH 29.6 26.0 - 34.0 pg   MCHC 30.6 30.0 - 36.0 g/dL   RDW 13.2 11.5 - 15.5 %   Platelets 247 150 - 400 K/uL   nRBC 0.0 0.0 - 0.2 %   Neutrophils Relative %  73 %   Neutro Abs 10.6 (H) 1.7 - 7.7 K/uL   Lymphocytes Relative 16 %   Lymphs Abs 2.3 0.7 - 4.0 K/uL   Monocytes Relative 6 %   Monocytes Absolute 0.9 0.1 - 1.0 K/uL   Eosinophils Relative 4 %   Eosinophils Absolute 0.6 (H) 0.0 - 0.5 K/uL   Basophils Relative 0 %   Basophils Absolute 0.1 0.0 - 0.1 K/uL   Immature Granulocytes 1 %   Abs Immature Granulocytes 0.08 (H) 0.00 - 0.07 K/uL  Blood gas, venous     Status: Abnormal   Collection Time: 07/07/22  8:18 PM  Result Value Ref Range   pH, Ven 7.33 7.25 - 7.43   pCO2, Ven 75 (HH) 44 - 60 mmHg   pO2, Ven 48 (H) 32 - 45 mmHg   Bicarbonate 39.5 (H) 20.0 - 28.0 mmol/L   Acid-Base Excess 11.1 (H) 0.0 - 2.0 mmol/L   O2 Saturation 80.8 %   Patient temperature 37.0   Urinalysis, Routine w reflex microscopic -Urine, Clean Catch     Status: Abnormal   Collection Time: 07/08/22  2:45 AM  Result Value Ref Range   Color, Urine STRAW (A) YELLOW   APPearance CLEAR CLEAR   Specific Gravity, Urine 1.005 1.005 - 1.030   pH 7.0 5.0 - 8.0   Glucose, UA NEGATIVE NEGATIVE mg/dL   Hgb urine dipstick NEGATIVE NEGATIVE   Bilirubin Urine NEGATIVE NEGATIVE   Ketones, ur NEGATIVE NEGATIVE mg/dL   Protein, ur NEGATIVE NEGATIVE mg/dL   Nitrite NEGATIVE NEGATIVE   Leukocytes,Ua NEGATIVE NEGATIVE  Rapid urine drug screen (hospital performed)     Status: Abnormal   Collection Time: 07/08/22  2:45 AM  Result Value Ref Range   Opiates NONE DETECTED NONE DETECTED   Cocaine NONE DETECTED NONE DETECTED   Benzodiazepines NONE DETECTED NONE DETECTED   Amphetamines NONE DETECTED NONE DETECTED    Tetrahydrocannabinol NONE DETECTED NONE DETECTED   Barbiturates POSITIVE (A) NONE DETECTED  Magnesium     Status: None   Collection Time: 07/08/22  4:36 AM  Result Value Ref Range   Magnesium 1.9 1.7 - 2.4 mg/dL  Basic metabolic panel     Status: Abnormal   Collection Time: 07/08/22  4:36 AM  Result Value Ref Range   Sodium 137 135 - 145 mmol/L   Potassium 3.6 3.5 - 5.1 mmol/L   Chloride 97 (L) 98 - 111 mmol/L   CO2 31 22 - 32 mmol/L   Glucose, Bld 128 (H) 70 - 99 mg/dL   BUN 11 8 - 23 mg/dL   Creatinine, Ser 0.71 0.44 - 1.00 mg/dL   Calcium 8.8 (L) 8.9 - 10.3 mg/dL   GFR, Estimated >60 >60 mL/min   Anion gap 9 5 - 15    I have reviewed pertinent nursing notes, vitals, labs, and images as necessary. I have ordered labwork to follow up on as indicated.  I have reviewed the last notes from staff over past 24 hours. I have discussed patient's care plan and test results with nursing staff, CM/SW, and other staff as appropriate.  Time spent: Greater than 50% of the 55 minute visit was spent in counseling/coordination of care for the patient as laid out in the A&P.   LOS: 1 day   Dwyane Dee, MD [facility] 07/08/2022, 12:00 PM

## 2022-07-08 NOTE — Assessment & Plan Note (Signed)
-   Last A1c 6.1% on 02/01/2022 - Continue SSI and CBG monitoring

## 2022-07-08 NOTE — Assessment & Plan Note (Signed)
-   Verified on database.  Takes Percocet 10-325 q6prn (last fill 3/18) - okay to resume

## 2022-07-09 DIAGNOSIS — G9341 Metabolic encephalopathy: Secondary | ICD-10-CM | POA: Diagnosis not present

## 2022-07-09 DIAGNOSIS — J9611 Chronic respiratory failure with hypoxia: Secondary | ICD-10-CM | POA: Diagnosis not present

## 2022-07-09 DIAGNOSIS — I5033 Acute on chronic diastolic (congestive) heart failure: Secondary | ICD-10-CM | POA: Diagnosis not present

## 2022-07-09 DIAGNOSIS — J9612 Chronic respiratory failure with hypercapnia: Secondary | ICD-10-CM | POA: Diagnosis not present

## 2022-07-09 LAB — CBC WITH DIFFERENTIAL/PLATELET
Abs Immature Granulocytes: 0.07 10*3/uL (ref 0.00–0.07)
Basophils Absolute: 0 10*3/uL (ref 0.0–0.1)
Basophils Relative: 0 %
Eosinophils Absolute: 0.4 10*3/uL (ref 0.0–0.5)
Eosinophils Relative: 3 %
HCT: 39.4 % (ref 36.0–46.0)
Hemoglobin: 12.2 g/dL (ref 12.0–15.0)
Immature Granulocytes: 1 %
Lymphocytes Relative: 13 %
Lymphs Abs: 2 10*3/uL (ref 0.7–4.0)
MCH: 29.4 pg (ref 26.0–34.0)
MCHC: 31 g/dL (ref 30.0–36.0)
MCV: 94.9 fL (ref 80.0–100.0)
Monocytes Absolute: 0.7 10*3/uL (ref 0.1–1.0)
Monocytes Relative: 5 %
Neutro Abs: 11.8 10*3/uL — ABNORMAL HIGH (ref 1.7–7.7)
Neutrophils Relative %: 78 %
Platelets: 264 10*3/uL (ref 150–400)
RBC: 4.15 MIL/uL (ref 3.87–5.11)
RDW: 13.3 % (ref 11.5–15.5)
WBC: 15.1 10*3/uL — ABNORMAL HIGH (ref 4.0–10.5)
nRBC: 0 % (ref 0.0–0.2)

## 2022-07-09 LAB — MAGNESIUM: Magnesium: 2.2 mg/dL (ref 1.7–2.4)

## 2022-07-09 LAB — BASIC METABOLIC PANEL
Anion gap: 10 (ref 5–15)
BUN: 11 mg/dL (ref 8–23)
CO2: 33 mmol/L — ABNORMAL HIGH (ref 22–32)
Calcium: 8.9 mg/dL (ref 8.9–10.3)
Chloride: 94 mmol/L — ABNORMAL LOW (ref 98–111)
Creatinine, Ser: 0.67 mg/dL (ref 0.44–1.00)
GFR, Estimated: >60 mL/min (ref >60)
Glucose, Bld: 156 mg/dL — ABNORMAL HIGH (ref 70–99)
Potassium: 3.2 mmol/L — ABNORMAL LOW (ref 3.5–5.1)
Sodium: 137 mmol/L (ref 135–145)

## 2022-07-09 LAB — GLUCOSE, CAPILLARY
Glucose-Capillary: 133 mg/dL — ABNORMAL HIGH (ref 70–99)
Glucose-Capillary: 140 mg/dL — ABNORMAL HIGH (ref 70–99)

## 2022-07-09 MED ORDER — POTASSIUM CHLORIDE CRYS ER 20 MEQ PO TBCR: 40.0000 meq | EXTENDED_RELEASE_TABLET | Freq: Every day | ORAL | 3 refills | Status: DC

## 2022-07-09 MED ORDER — POTASSIUM CHLORIDE CRYS ER 20 MEQ PO TBCR
40.0000 meq | EXTENDED_RELEASE_TABLET | Freq: Once | ORAL | Status: AC
  Administered 2022-07-09: 40 meq via ORAL
  Filled 2022-07-09: qty 2

## 2022-07-09 MED ORDER — LISINOPRIL 5 MG PO TABS: 5.0000 mg | ORAL_TABLET | Freq: Every day | ORAL | 3 refills | Status: DC

## 2022-07-09 NOTE — Progress Notes (Signed)
SATURATION QUALIFICATIONS: (This note is used to comply with regulatory documentation for home oxygen)  Patient Saturations on Room Air at Rest = 93%  Patient Saturations on Room Air while Ambulating = 91%  Patient Saturations on 2 Liters of oxygen while Ambulating = 96%  Please briefly explain why patient needs home oxygen:

## 2022-07-09 NOTE — Progress Notes (Signed)
AVS given to patient and explained at the bedside. Medications and follow up appointments have been explained with pt verbalizing understanding. Cab voucher provided for transport home

## 2022-07-09 NOTE — TOC Initial Note (Signed)
Transition of Care [facility]) - Initial/Assessment Note    Patient Details  Name: Michele Owens MRN: [id number] Date of Birth: 05-09-1960  [facility]) CM/SW Contact:    Rodney Booze, LCSW Phone Number: 07/09/2022, 10:31 AM  Clinical Narrative:                   Expected Discharge Plan: Home/Self Care Barriers to Discharge: No Barriers Identified   Patient Goals and CMS Choice    Patient needed transport at this time there has been no other TOC needs.         Expected Discharge Plan and Services       Living arrangements for the past 2 months: Apartment Expected Discharge Date: 07/09/22                                    Prior Living Arrangements/Services Living arrangements for the past 2 months: Apartment Lives with:: Self Patient language and need for interpreter reviewed:: No        Need for Family Participation in Patient Care: No (Comment) Care giver support system in place?: Yes (comment)   Criminal Activity/Legal Involvement Pertinent to Current Situation/Hospitalization: No - Comment as needed  Activities of Daily Living Home Assistive Devices/Equipment: Cane (specify quad or straight) ADL Screening (condition at time of admission) Patient's cognitive ability adequate to safely complete daily activities?: Yes Is the patient deaf or have difficulty hearing?: No Does the patient have difficulty seeing, even when wearing glasses/contacts?: No Does the patient have difficulty concentrating, remembering, or making decisions?: No Patient able to express need for assistance with ADLs?: No Does the patient have difficulty dressing or bathing?: Yes Independently performs ADLs?: No Communication: Independent Dressing (OT): Independent Grooming: Independent Feeding: Independent Bathing: Needs assistance Is this a change from baseline?: Pre-admission baseline Toileting: Independent In/Out Bed: Needs assistance Is this a change from  baseline?: Pre-admission baseline Walks in Home: Independent Does the patient have difficulty walking or climbing stairs?: Yes Weakness of Legs: Left Weakness of Arms/Hands: None  Permission Sought/Granted                  Emotional Assessment       Orientation: : Oriented to Place, Oriented to Self, Oriented to Situation, Oriented to  Time Alcohol / Substance Use: Not Applicable Psych Involvement: No (comment)  Admission diagnosis:  Leg swelling [M79.89] Acute on chronic respiratory failure with hypoxia and hypercapnia [J96.21, J96.22] Acute on chronic heart failure with preserved ejection fraction (HFpEF) [I50.33] Patient Active Problem List   Diagnosis Date Noted   Current mild episode of major depressive disorder without prior episode ([place]) 06/06/2022   Fever 06/04/2022   Acute on chronic diastolic (congestive) heart failure ([place]) 05/16/2022   Acute renal failure (ARF) ([place]) 02/01/2022   Hypothyroidism 02/01/2022   Chronic pain disorder 02/01/2022   Respiratory failure with hypercapnia ([place]) 12/23/2020   Anemia 12/23/2020   Acute on chronic respiratory failure with hypoxia ([place]) 04/18/2020   Hemoptysis 04/18/2020   Acute encephalopathy 04/18/2020   Chronic diastolic CHF (congestive heart failure) ([place]) 04/18/2020   Sepsis ([place]) 04/18/2020   Obesity, Class III, BMI 40-49.9 (morbid obesity) ([place])    Fall at home, initial encounter 06/23/2018   HNP (herniated nucleus pulposus), thoracic 06/23/2018   CHF (congestive heart failure) ([place]) 06/18/2018   Essential hypertension 08/28/2017   Obesity 08/28/2017   COPD exacerbation ([place]) 08/04/2017  Acute and chronic respiratory failure with hypercapnia (HCC)    Pulmonary HTN ([place])    Genetic testing 02/27/2017   Family history of thyroid cancer    Hypomagnesemia 10/03/2016   Atypical chest pain    HLD (hyperlipidemia) 09/23/2016   COPD (chronic obstructive pulmonary disease) ([place]) 09/23/2016   Gout 09/23/2016   DVT  (deep vein thrombosis) in pregnancy 09/23/2016   Thyroid cancer ([place]) 09/23/2016   Hypocalcemia 09/23/2016   AKI (acute kidney injury) ([place]) 09/23/2016   Acute pulmonary edema (HCC)    Acute hypoxemic respiratory failure (HCC)    Chronic respiratory failure with hypoxia and hypercapnia ([place]) 07/16/2016   CAP (community acquired pneumonia) 01/23/2016   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 01/23/2016   Pulmonary hypertension ([place]) 01/23/2016   Obesity hypoventilation syndrome ([place]) 08/26/2015   Dyslipidemia associated with type 2 diabetes mellitus ([place]) 04/24/2015   Controlled type 2 diabetes mellitus without complication, without long-term current use of insulin ([place]) 04/24/2015   Depression with anxiety 04/24/2015   Benign essential HTN 04/24/2015   Normocytic anemia 04/24/2015   OSA (obstructive sleep apnea) 05/10/2012   Tobacco abuse 07/04/2011   PCP:  [name], MD Pharmacy:   [place], [place] - [address] [place] [address] Phone: [phone] Fax: [place], [place] - [address] [place] [address] Phone: [phone] Fax: [phone]  [facility] [address] [place] [address] Phone: [phone] Fax: [phone]     Social Determinants of Health (SDOH) Social History: SDOH Screenings   Food Insecurity: No Food Insecurity (07/08/2022)  Housing: Low Risk  (07/08/2022)  Transportation Needs: No Transportation Needs (07/08/2022)  Utilities: At Risk (07/08/2022)  Alcohol Screen: Low Risk  (01/26/2021)  Depression (PHQ2-9): Low Risk  (03/21/2021)  Financial Resource Strain: Low Risk  (02/01/2021)  Physical Activity: Inactive (02/01/2021)  Social Connections: Moderately Isolated (01/27/2021)  Stress: Stress Concern Present (02/23/2021)  Tobacco Use: Medium Risk (07/07/2022)    SDOH Interventions:     Readmission Risk Interventions    07/09/2022   10:29 AM 05/18/2022   11:29 AM  Readmission Risk Prevention Plan  Transportation Screening Complete Complete  PCP or Specialist Appt within 3-5 Days Complete   HRI or Home Care Consult Complete Complete  Social Work Consult for [place] Planning/Counseling Complete Complete  Palliative Care Screening Not Applicable Complete  Medication Review Press photographer) Not Complete Referral to Pharmacy

## 2022-07-09 NOTE — Discharge Summary (Signed)
Physician Discharge Summary   Michele Owens [id number] DOB: [birth year] DOA: 07/07/2022  PCP: [name], MD  Admit date: 07/07/2022 Discharge date: 07/09/2022  Admitted From: Home Disposition:  Home Discharging physician: Dwyane Dee, MD Barriers to discharge: none  Recommendations for Outpatient Follow-up:  Follow up with cardiology  Recheck BMP; may need adjustment of KCL supplement   Discharge Condition: stable CODE STATUS: Full Diet recommendation:  Diet Orders (From admission, onward)     Start     Ordered   07/09/22 0000  Diet - low sodium heart healthy        07/09/22 0858   07/08/22 0918  Diet Heart Room service appropriate? Yes; Fluid consistency: Thin  Diet effective now       Question Answer Comment  Room service appropriate? Yes   Fluid consistency: Thin      07/08/22 0917            Hospital Course: Michele Owens is a 62 y.o. female with medical history significant for COPD, chronic respiratory failure with hypoxia on 3 L O2 via [place], HFpEF (EF 55-60%), T2DM, HTN, HLD, hypothyroidism, depression/anxiety, morbid obesity who presented to the hospital with worsening lower extremity edema, abdominal distention, and confusion. On workup she was found to have hypercarbic respiratory failure, pCO2 75.  She was placed on BiPAP and started on IV Lasix. CXR showed increased vascular congestion compared with prior x-ray. She diuresed well with Lasix.  She ambulated and maintained adequate saturations on 2 L. Home regimen was recommended to be resumed of her torsemide at discharge.  Patient stated she was only taking torsemide once daily at home instead of twice daily as prescribed.  Assessment and Plan: * Chronic respiratory failure with hypoxia and hypercapnia (HCC) - on 3L at home -Suspect multifactorial component with underlying OHS and OSA -Perform walk test prior to discharge and may not need 3 L.  Sat goal greater than 88% - patient recommended for  2L at discharge after walk test  Acute on chronic diastolic (congestive) heart failure (HCC) -Criteria includes pulmonary rales/crackles, pulmonary edema, bilateral leg edema, dyspnea on exertion -BNP suspected to be falsely low in setting of morbid obesity although BNP value 54 is significantly higher than her prior values -Notably she has worsening dyspnea with associated volume overload in her arms, legs, and abdomen -Patient only compliant with torsemide once a day; will resume at twice daily dosing at discharge - doesn't appear to have established with cardiology? Will repeat referral - had clinical improvement of LE edema and no further DOE when ambulating   Acute metabolic encephalopathy-resolved as of 07/08/2022 - patient symptoms include confusion - etiology considered due to hypercarbic respiratory failure - Mentation now back to normal   COPD (chronic obstructive pulmonary disease) (HCC) - No signs/symptoms of exacerbation - Continue as needed DuoNebs - Continue Anoro Ellipta  Chronic pain disorder - Verified on database.  Takes Percocet 10-325 q6prn (last fill 3/18) - okay to resume  Hypothyroidism - Continue Synthroid  Obesity, Class III, BMI 40-49.9 (morbid obesity) ([place]) - Complicates overall prognosis and care - Body mass index is 46.85 kg/m.  Essential hypertension - Continue Coreg  Depression with anxiety - continue klonopin; verified on database   Controlled type 2 diabetes mellitus without complication, without long-term current use of insulin (HCC) - Last A1c 6.1% on 02/01/2022  Dyslipidemia associated with type 2 diabetes mellitus ([place]) - Continue Crestor   The patient's chronic medical conditions were treated accordingly per the patient's  home medication regimen except as noted.  On day of discharge, patient was felt deemed stable for discharge. Patient/family member advised to call PCP or come back to ER if needed.   Principal Diagnosis: Chronic  respiratory failure with hypoxia and hypercapnia  Discharge Diagnoses: Active Hospital Problems   Diagnosis Date Noted   Chronic respiratory failure with hypoxia and hypercapnia ([place]) 07/16/2016    Priority: 2.   Acute on chronic diastolic (congestive) heart failure ([place]) 05/16/2022    Priority: 1.   COPD (chronic obstructive pulmonary disease) ([place]) 09/23/2016    Priority: 3.   Chronic pain disorder 02/01/2022    Priority: 4.   Hypothyroidism 02/01/2022   Obesity, Class III, BMI 40-49.9 (morbid obesity) ([place])    Essential hypertension 08/28/2017   Controlled type 2 diabetes mellitus without complication, without long-term current use of insulin ([place]) 04/24/2015   Dyslipidemia associated with type 2 diabetes mellitus ([place]) 04/24/2015   Depression with anxiety 04/24/2015    Resolved Hospital Problems   Diagnosis Date Noted Date Resolved   Acute metabolic encephalopathy [id number] 07/08/2022    Priority: 2.     Discharge Instructions     Ambulatory referral to Cardiology   Complete by: As directed    Diet - low sodium heart healthy   Complete by: As directed    Increase activity slowly   Complete by: As directed       Allergies as of 07/09/2022       Reactions   Bee Venom Swelling, Other (See Comments)   "All over my body" (swelling)   Fioricet [butalbital-apap-caffeine] Nausea And Vomiting, Rash   Ibuprofen Rash, Other (See Comments)   Severe rash   Lamisil [terbinafine] Rash, Other (See Comments)   Pt states this causes her to "feel funny"   Nsaids Other (See Comments)   Per MD's orders         Medication List     STOP taking these medications    dicyclomine 20 MG tablet Commonly known as: BENTYL       TAKE these medications    acetaminophen 500 MG tablet Commonly known as: TYLENOL Take 1,000 mg by mouth every 6 (six) hours as needed for moderate pain or headache.   albuterol 108 (90 Base) MCG/ACT inhaler Commonly known as: VENTOLIN HFA Inhale  1-2 puffs into the lungs every 6 (six) hours as needed for wheezing or shortness of breath.   allopurinol 100 MG tablet Commonly known as: ZYLOPRIM Take 100 mg by mouth daily.   Anoro Ellipta 62.5-25 MCG/ACT Aepb Generic drug: umeclidinium-vilanterol Inhale 1 puff into the lungs daily.   aspirin EC 81 MG tablet Take 81 mg by mouth daily. Swallow whole.   blood glucose meter kit and supplies Kit Dispense based on patient and insurance preference. Use up to four times daily as directed.   busPIRone 5 MG tablet Commonly known as: BUSPAR Take 5 mg by mouth 3 (three) times daily.   butalbital-acetaminophen-caffeine 50-325-40 MG tablet Commonly known as: FIORICET Take 1 tablet by mouth 2 (two) times daily as needed for headache or migraine.   calcitRIOL 0.5 MCG capsule Commonly known as: ROCALTROL Take 1 capsule (0.5 mcg total) by mouth daily.   carvedilol 3.125 MG tablet Commonly known as: COREG Take 1 tablet (3.125 mg total) by mouth 2 (two) times daily with a meal.   clonazePAM 2 MG tablet Commonly known as: KLONOPIN Take 1 tablet (2 mg total) by mouth 3 (three) times daily. for anxiety What changed:  additional instructions   cyclobenzaprine 10 MG tablet Commonly known as: FLEXERIL Take 10 mg by mouth 3 (three) times daily as needed for muscle spasms.   EPINEPHrine 0.3 mg/0.3 mL Soaj injection Commonly known as: EPI-PEN Inject 0.3 mg into the muscle once as needed for anaphylaxis.   fenofibrate 145 MG tablet Commonly known as: TRICOR Take 145 mg by mouth daily.   FeroSul 325 (65 FE) MG tablet Generic drug: ferrous sulfate Take 325 mg by mouth daily.   levocetirizine 5 MG tablet Commonly known as: XYZAL Take 5 mg by mouth at bedtime.   levothyroxine 112 MCG tablet Commonly known as: SYNTHROID Take 224 mcg by mouth daily before breakfast.   lisinopril 5 MG tablet Commonly known as: ZESTRIL Take 1 tablet (5 mg total) by mouth daily. Please hold this medication  for three days, please check your blood pressure at home, bring in record for your pcp to review   loperamide 2 MG capsule Commonly known as: IMODIUM Take 1 capsule (2 mg total) by mouth 4 (four) times daily as needed for diarrhea or loose stools.   loratadine 10 MG tablet Commonly known as: CLARITIN Take 10 mg by mouth daily as needed for allergies.   nitroGLYCERIN 0.4 MG SL tablet Commonly known as: NITROSTAT Place 1 tablet (0.4 mg total) under the tongue every 5 (five) minutes as needed for chest pain.   omeprazole 20 MG capsule Commonly known as: PRILOSEC Take 20 mg by mouth 2 (two) times daily as needed (acid reflux).   ondansetron 4 MG tablet Commonly known as: ZOFRAN Take 4 mg by mouth every 8 (eight) hours as needed for nausea or vomiting.   oxyCODONE-acetaminophen 10-325 MG tablet Commonly known as: PERCOCET Take 1 tablet by mouth every 6 (six) hours as needed for pain.   OXYGEN Inhale 3 L into the lungs continuous.   phentermine 37.5 MG tablet Commonly known as: ADIPEX-P Take 37.5 mg by mouth daily.   polyethylene glycol 17 g packet Commonly known as: MIRALAX / GLYCOLAX Take 17 g by mouth daily as needed for mild constipation.   potassium chloride SA 20 MEQ tablet Commonly known as: KLOR-CON M Take 2 tablets (40 mEq total) by mouth daily. What changed:  how much to take when to take this   rosuvastatin 10 MG tablet Commonly known as: CRESTOR Take 10 mg by mouth every evening.   senna-docusate 8.6-50 MG tablet Commonly known as: Senokot-S Take 1 tablet by mouth daily.   torsemide 20 MG tablet Commonly known as: DEMADEX Take 1 tablet (20 mg total) by mouth 2 (two) times daily.   traZODone 50 MG tablet Commonly known as: DESYREL Take 100 mg by mouth at bedtime.   valACYclovir 1000 MG tablet Commonly known as: VALTREX Take 1,000 mg by mouth daily.   Xifaxan 550 MG Tabs tablet Generic drug: rifaximin Take 550 mg by mouth 3 (three) times daily.         Allergies  Allergen Reactions   Bee Venom Swelling and Other (See Comments)    "All over my body" (swelling)   Fioricet [Butalbital-Apap-Caffeine] Nausea And Vomiting and Rash   Ibuprofen Rash and Other (See Comments)    Severe rash   Lamisil [Terbinafine] Rash and Other (See Comments)    Pt states this causes her to "feel funny"   Nsaids Other (See Comments)    Per MD's orders     Consultations:   Procedures:   Discharge Exam: BP 133/77 (BP Location: Right Arm)  Pulse 89   Temp 99 F (37.2 C) (Oral)   Resp 20   Ht 5\' 5"  (1.651 m)   Wt 124.4 kg   SpO2 92%   BMI 45.64 kg/m  Physical Exam Constitutional:      Appearance: Normal appearance. She is obese.  HENT:     Head: Normocephalic and atraumatic.     Mouth/Throat:     Mouth: Mucous membranes are moist.  Eyes:     Extraocular Movements: Extraocular movements intact.  Cardiovascular:     Rate and Rhythm: Normal rate and regular rhythm.  Pulmonary:     Effort: Pulmonary effort is normal. No respiratory distress.     Breath sounds: Normal breath sounds. No wheezing.  Abdominal:     General: Bowel sounds are normal. There is no distension.     Palpations: Abdomen is soft.     Tenderness: There is no abdominal tenderness.  Musculoskeletal:        General: Swelling (improved LE edema now 1+) present. Normal range of motion.     Cervical back: Neck supple.  Skin:    General: Skin is warm and dry.  Neurological:     General: No focal deficit present.     Mental Status: She is alert.  Psychiatric:        Mood and Affect: Mood normal.        Behavior: Behavior normal.      The results of significant diagnostics from this hospitalization (including imaging, microbiology, ancillary and laboratory) are listed below for reference.   Microbiology: No results found for this or any previous visit (from the past 240 hour(s)).   Labs: BNP (last 3 results) Recent Labs    05/15/22 0040 06/04/22 1314  07/07/22 1930  BNP 11.3 16.3 [id number]   Basic Metabolic Panel: Recent Labs  Lab 07/07/22 1930 07/08/22 0436 07/09/22 0400  NA 137 137 137  K 3.7 3.6 3.2*  CL 97* 97* 94*  CO2 32 31 33*  GLUCOSE 105* 128* 156*  BUN 12 11 11   CREATININE 0.77 0.71 0.67  CALCIUM 8.5* 8.8* 8.9  MG  --  1.9 2.2   Liver Function Tests: Recent Labs  Lab 07/07/22 1930  AST 16  ALT 14  ALKPHOS 48  BILITOT 0.3  PROT 6.6  ALBUMIN 3.7   No results for input(s): "LIPASE", "AMYLASE" in the last 168 hours. No results for input(s): "AMMONIA" in the last 168 hours. CBC: Recent Labs  Lab 07/07/22 1930 07/09/22 0400  WBC 14.6* 15.1*  NEUTROABS 10.6* 11.8*  HGB 10.9* 12.2  HCT 35.6* 39.4  MCV 96.7 94.9  PLT 247 264   Cardiac Enzymes: No results for input(s): "CKTOTAL", "CKMB", "CKMBINDEX", "TROPONINI" in the last 168 hours. BNP: Invalid input(s): "POCBNP" CBG: Recent Labs  Lab 07/08/22 1727 07/08/22 2048 07/09/22 0730  GLUCAP 121* 171* 133*   D-Dimer No results for input(s): "DDIMER" in the last 72 hours. Hgb A1c No results for input(s): "HGBA1C" in the last 72 hours. Lipid Profile No results for input(s): "CHOL", "HDL", "LDLCALC", "TRIG", "CHOLHDL", "LDLDIRECT" in the last 72 hours. Thyroid function studies Recent Labs    07/07/22 0042  TSH 0.631   Anemia work up No results for input(s): "VITAMINB12", "FOLATE", "FERRITIN", "TIBC", "IRON", "RETICCTPCT" in the last 72 hours. Urinalysis    Component Value Date/Time   COLORURINE STRAW (A) 07/08/2022 0245   APPEARANCEUR CLEAR 07/08/2022 0245   LABSPEC 1.005 07/08/2022 0245   PHURINE 7.0 07/08/2022 0245   GLUCOSEU  NEGATIVE 07/08/2022 0245   HGBUR NEGATIVE 07/08/2022 0245   BILIRUBINUR NEGATIVE 07/08/2022 0245   KETONESUR NEGATIVE 07/08/2022 0245   PROTEINUR NEGATIVE 07/08/2022 0245   UROBILINOGEN 0.2 02/28/2012 2004   NITRITE NEGATIVE 07/08/2022 0245   LEUKOCYTESUR NEGATIVE 07/08/2022 0245   Sepsis Labs Recent Labs  Lab  07/07/22 1930 07/09/22 0400  WBC 14.6* 15.1*   Microbiology No results found for this or any previous visit (from the past 240 hour(s)).  Procedures/Studies: DG Chest Port 1 View  Result Date: 07/07/2022 CLINICAL DATA:  Peripheral edema EXAM: PORTABLE CHEST 1 VIEW COMPARISON:  06/04/2022 FINDINGS: Cardiac shadow is enlarged but stable. Aortic calcifications are seen. Increased central vascular congestion is noted when compare with the prior exam. No significant edema is seen. No focal confluent infiltrate is noted. Platelike atelectasis is noted in the bases bilaterally. IMPRESSION: Mild increased vascular congestion when compare with the prior exam. Electronically Signed   By: Inez Catalina M.D.   On: 07/07/2022 19:49     Time coordinating discharge: Over 30 minutes    Dwyane Dee, MD  [facility] 07/09/2022, 11:09 AM

## 2022-07-09 NOTE — TOC Transition Note (Signed)
Transition of Care [facility]) - CM/SW Discharge Note   Patient Details  Name: Michele Owens MRN: [id number] Date of Birth: May 26, 1960  Transition of Care [facility]) CM/SW Contact:  Rodney Booze, LCSW Phone Number: 07/09/2022, 10:31 AM   Clinical Narrative:    CSW provided Transport to patient. At this time there are no other TOC needs.    Final next level of care: Home/Self Care Barriers to Discharge: No Barriers Identified   Patient Goals and CMS Choice      Discharge Placement                         Discharge Plan and Services Additional resources added to the After Visit Summary for                                       Social Determinants of Health (SDOH) Interventions SDOH Screenings   Food Insecurity: No Food Insecurity (07/08/2022)  Housing: Low Risk  (07/08/2022)  Transportation Needs: No Transportation Needs (07/08/2022)  Utilities: At Risk (07/08/2022)  Alcohol Screen: Low Risk  (01/26/2021)  Depression (PHQ2-9): Low Risk  (03/21/2021)  Financial Resource Strain: Low Risk  (02/01/2021)  Physical Activity: Inactive (02/01/2021)  Social Connections: Moderately Isolated (01/27/2021)  Stress: Stress Concern Present (02/23/2021)  Tobacco Use: Medium Risk (07/07/2022)     Readmission Risk Interventions    07/09/2022   10:29 AM 05/18/2022   11:29 AM  Readmission Risk Prevention Plan  Transportation Screening Complete Complete  PCP or Specialist Appt within 3-5 Days Complete   HRI or [place] Complete Complete  Social Work Consult for [place] Planning/Counseling Complete Complete  Palliative Care Screening Not Applicable Complete  Medication Review Press photographer) Not Complete Referral to Pharmacy

## 2022-07-10 DIAGNOSIS — Z419 Encounter for procedure for purposes other than remedying health state, unspecified: Secondary | ICD-10-CM | POA: Diagnosis not present

## 2022-07-10 LAB — HEMOGLOBIN A1C
Hgb A1c MFr Bld: 6.2 % — ABNORMAL HIGH (ref 4.8–5.6)
Mean Plasma Glucose: 131 mg/dL

## 2022-07-12 DIAGNOSIS — I110 Hypertensive heart disease with heart failure: Secondary | ICD-10-CM | POA: Diagnosis not present

## 2022-07-13 DIAGNOSIS — I110 Hypertensive heart disease with heart failure: Secondary | ICD-10-CM | POA: Diagnosis not present

## 2022-07-14 DIAGNOSIS — I110 Hypertensive heart disease with heart failure: Secondary | ICD-10-CM | POA: Diagnosis not present

## 2022-07-17 DIAGNOSIS — I110 Hypertensive heart disease with heart failure: Secondary | ICD-10-CM | POA: Diagnosis not present

## 2022-07-25 DIAGNOSIS — I110 Hypertensive heart disease with heart failure: Secondary | ICD-10-CM | POA: Diagnosis not present

## 2022-07-26 DIAGNOSIS — I110 Hypertensive heart disease with heart failure: Secondary | ICD-10-CM | POA: Diagnosis not present

## 2022-07-27 DIAGNOSIS — I110 Hypertensive heart disease with heart failure: Secondary | ICD-10-CM | POA: Diagnosis not present

## 2022-07-28 DIAGNOSIS — I110 Hypertensive heart disease with heart failure: Secondary | ICD-10-CM | POA: Diagnosis not present

## 2022-08-04 DIAGNOSIS — M25562 Pain in left knee: Secondary | ICD-10-CM | POA: Diagnosis not present

## 2022-08-04 DIAGNOSIS — M25561 Pain in right knee: Secondary | ICD-10-CM | POA: Diagnosis not present

## 2022-08-06 DIAGNOSIS — H5213 Myopia, bilateral: Secondary | ICD-10-CM | POA: Diagnosis not present

## 2022-08-07 DIAGNOSIS — I110 Hypertensive heart disease with heart failure: Secondary | ICD-10-CM | POA: Diagnosis not present

## 2022-08-08 DIAGNOSIS — M25562 Pain in left knee: Secondary | ICD-10-CM | POA: Diagnosis not present

## 2022-08-09 DIAGNOSIS — Z419 Encounter for procedure for purposes other than remedying health state, unspecified: Secondary | ICD-10-CM | POA: Diagnosis not present

## 2022-08-09 DIAGNOSIS — I110 Hypertensive heart disease with heart failure: Secondary | ICD-10-CM | POA: Diagnosis not present

## 2022-08-11 DIAGNOSIS — N179 Acute kidney failure, unspecified: Secondary | ICD-10-CM | POA: Diagnosis not present

## 2022-08-11 DIAGNOSIS — I129 Hypertensive chronic kidney disease with stage 1 through stage 4 chronic kidney disease, or unspecified chronic kidney disease: Secondary | ICD-10-CM | POA: Diagnosis not present

## 2022-08-11 DIAGNOSIS — I5030 Unspecified diastolic (congestive) heart failure: Secondary | ICD-10-CM | POA: Diagnosis not present

## 2022-08-28 ENCOUNTER — Other Ambulatory Visit: Payer: Medicaid Other | Admitting: Obstetrics and Gynecology

## 2022-08-28 NOTE — Patient Outreach (Signed)
RNCM called patient back. Patient states she has an appointment at [facility] on [address] 09/15/22.  She states she is no longer seeing Dr. Mikeal Hawthorne. One year anniversary of son's death approaching-emotional support given.  Kathi Der RN, BSN [place]  [facility] - Managed Medicaid High Risk [phone].

## 2022-08-28 NOTE — Patient Outreach (Signed)
RNCM returned patient's phone call, no answer, left message.  Kathi Der RN, BSN [place]  [employer] - Managed Medicaid High Risk [phone]

## 2022-09-06 ENCOUNTER — Other Ambulatory Visit: Payer: Medicaid Other | Admitting: Obstetrics and Gynecology

## 2022-09-06 NOTE — Patient Outreach (Signed)
RNCM returned  patient's phone call-no answer, left message.

## 2022-09-15 DIAGNOSIS — Z6841 Body Mass Index (BMI) 40.0 and over, adult: Secondary | ICD-10-CM | POA: Diagnosis not present

## 2022-09-15 DIAGNOSIS — N189 Chronic kidney disease, unspecified: Secondary | ICD-10-CM | POA: Diagnosis not present

## 2022-09-15 DIAGNOSIS — Z79899 Other long term (current) drug therapy: Secondary | ICD-10-CM | POA: Diagnosis not present

## 2022-09-15 DIAGNOSIS — M25562 Pain in left knee: Secondary | ICD-10-CM | POA: Diagnosis not present

## 2022-09-15 DIAGNOSIS — J449 Chronic obstructive pulmonary disease, unspecified: Secondary | ICD-10-CM | POA: Diagnosis not present

## 2022-09-15 DIAGNOSIS — M25561 Pain in right knee: Secondary | ICD-10-CM | POA: Diagnosis not present

## 2022-09-19 DIAGNOSIS — Z79899 Other long term (current) drug therapy: Secondary | ICD-10-CM | POA: Diagnosis not present

## 2022-10-02 ENCOUNTER — Encounter (HOSPITAL_COMMUNITY): Payer: Self-pay

## 2022-10-02 ENCOUNTER — Emergency Department (HOSPITAL_COMMUNITY)
Admission: EM | Admit: 2022-10-02 | Discharge: 2022-10-03 | Disposition: A | Discharge status: Home / Self Care | Payer: Medicaid Other | Attending: Emergency Medicine | Admitting: Emergency Medicine

## 2022-10-02 ENCOUNTER — Ambulatory Visit: Payer: Self-pay | Admitting: *Deleted

## 2022-10-02 ENCOUNTER — Emergency Department (HOSPITAL_COMMUNITY): Payer: Medicaid Other

## 2022-10-02 ENCOUNTER — Other Ambulatory Visit: Payer: Self-pay

## 2022-10-02 DIAGNOSIS — M7989 Other specified soft tissue disorders: Secondary | ICD-10-CM | POA: Diagnosis present

## 2022-10-02 DIAGNOSIS — Z85850 Personal history of malignant neoplasm of thyroid: Secondary | ICD-10-CM | POA: Diagnosis not present

## 2022-10-02 DIAGNOSIS — I110 Hypertensive heart disease with heart failure: Secondary | ICD-10-CM | POA: Insufficient documentation

## 2022-10-02 DIAGNOSIS — R600 Localized edema: Secondary | ICD-10-CM | POA: Diagnosis not present

## 2022-10-02 DIAGNOSIS — I5032 Chronic diastolic (congestive) heart failure: Secondary | ICD-10-CM | POA: Insufficient documentation

## 2022-10-02 DIAGNOSIS — E119 Type 2 diabetes mellitus without complications: Secondary | ICD-10-CM | POA: Diagnosis not present

## 2022-10-02 DIAGNOSIS — J449 Chronic obstructive pulmonary disease, unspecified: Secondary | ICD-10-CM | POA: Diagnosis not present

## 2022-10-02 DIAGNOSIS — Z87891 Personal history of nicotine dependence: Secondary | ICD-10-CM | POA: Diagnosis not present

## 2022-10-02 LAB — CBC
HCT: 39.6 % (ref 36.0–46.0)
Hemoglobin: 12.4 g/dL (ref 12.0–15.0)
MCH: 28.9 pg (ref 26.0–34.0)
MCHC: 31.3 g/dL (ref 30.0–36.0)
MCV: 92.3 fL (ref 80.0–100.0)
Platelets: 341 10*3/uL (ref 150–400)
RBC: 4.29 MIL/uL (ref 3.87–5.11)
RDW: 13.8 % (ref 11.5–15.5)
WBC: 17.7 10*3/uL — ABNORMAL HIGH (ref 4.0–10.5)
nRBC: 0 % (ref 0.0–0.2)

## 2022-10-02 LAB — BASIC METABOLIC PANEL
Anion gap: 17 — ABNORMAL HIGH (ref 5–15)
BUN: 18 mg/dL (ref 8–23)
CO2: 27 mmol/L (ref 22–32)
Calcium: 9.5 mg/dL (ref 8.9–10.3)
Chloride: 97 mmol/L — ABNORMAL LOW (ref 98–111)
Creatinine, Ser: 1.07 mg/dL — ABNORMAL HIGH (ref 0.44–1.00)
GFR, Estimated: 59 mL/min — ABNORMAL LOW (ref >60)
Glucose, Bld: 123 mg/dL — ABNORMAL HIGH (ref 70–99)
Potassium: 3.8 mmol/L (ref 3.5–5.1)
Sodium: 141 mmol/L (ref 135–145)

## 2022-10-02 LAB — BRAIN NATRIURETIC PEPTIDE: B Natriuretic Peptide: 8.9 pg/mL (ref 0.0–100.0)

## 2022-10-02 LAB — D-DIMER, QUANTITATIVE: D-Dimer, Quant: 0.35 ug/mL-FEU (ref 0.00–0.50)

## 2022-10-02 LAB — TROPONIN I (HIGH SENSITIVITY)
Troponin I (High Sensitivity): 7 ng/L (ref <18)
Troponin I (High Sensitivity): 7 ng/L (ref <18)

## 2022-10-02 MED ORDER — FUROSEMIDE 10 MG/ML IJ SOLN
20.0000 mg | Freq: Once | INTRAMUSCULAR | Status: AC
  Administered 2022-10-02: 20 mg via INTRAVENOUS
  Filled 2022-10-02: qty 2

## 2022-10-02 MED ORDER — ACETAMINOPHEN 500 MG PO TABS
1000.0000 mg | ORAL_TABLET | Freq: Once | ORAL | Status: AC
  Administered 2022-10-02: 1000 mg via ORAL
  Filled 2022-10-02: qty 2

## 2022-10-02 NOTE — Telephone Encounter (Signed)
Reason for Disposition  Patient sounds very sick or weak to the triager  Answer Assessment - Initial Assessment Questions 1. ONSET: "When did the swelling start?" (e.g., minutes, hours, days)     I have a lot of fluid gathered up.   I don't want it to get into my heart.    I have COPD.   I have diabetes and on oxygen all the time.   I bring oxygen with me when I'm out.   I use a cane.  I've been walking with my walker and it's not getting better.   When I went to my knee doctor stuck the needle in the wrong spot and I still feel it today.  It's so sore my knee.    I see Dr Mikeal Hawthorne. I have a lot of issues and a lot on me.   Someone murdered son July 12, 2022.  I'm going through a lot with my daughter right now too.    Pt. Started crying.    Last time I saw Dr. Mikeal Hawthorne I told him about the hospital they had to stick me a lot.   They put the medicine in my IV and the medicine got rid of my fluid build up.    I'm not with Dr. Mikeal Hawthorne any more.    I have a heart dr.   I forgot their number.   The lady at Dr. Maxwell Caul office will tell me I can't see him because I'm not with him anymore. I get an IV and they give me medicine to get rid of the fluid.     I need to go back to the hospital to get that medicine again.  2. LOCATION: "What part of the leg is swollen?"  "Are both legs swollen or just one leg?"     I think I twisted my right foot because I think I slipped on my oxygen tank cord.    I'm trying to call 911 to come get me but I'm trying to get this stuff out of their way here. My sugar was 123 and 123 this morning.    My sugar stays pretty good. When the nurse comes in to talk with me I'm going to see if I an get referred to water exercise.     My heart was beating so I put the medicine under my tongue.   I instructed her to call 911.    She was agreeable to calling.   She started crying again as she was getting off the phone. 3. SEVERITY: "How bad is the swelling?" (e.g., localized; mild, moderate,  severe)   - Localized: Small area of swelling localized to one leg.   - MILD pedal edema: Swelling limited to foot and ankle, pitting edema < 1/4 inch (6 mm) deep, rest and elevation eliminate most or all swelling.   - MODERATE edema: Swelling of lower leg to knee, pitting edema > 1/4 inch (6 mm) deep, rest and elevation only partially reduce swelling.   - SEVERE edema: Swelling extends above knee, facial or hand swelling present.      Both legs are swollen.    When I asked if she was having chest pain or shortness of breath she said she put that medicine under her tongue for her heart.   I instructed her to call 911.    She was mentioning many issues she was having.   I was finally able to get her to call 911 now and let's her  some help which she was agreeable to doing. 4. REDNESS: "Does the swelling look red or infected?"     Not asked 5. PAIN: "Is the swelling painful to touch?" If Yes, ask: "How painful is it?"   (Scale 1-10; mild, moderate or severe)     Not asked 6. FEVER: "Do you have a fever?" If Yes, ask: "What is it, how was it measured, and when did it start?"      Not asked 7. CAUSE: "What do you think is causing the leg swelling?"     The fluid is building up and I don't want it to get to my heart again. 8. MEDICAL HISTORY: "Do you have a history of blood clots (e.g., DVT), cancer, heart failure, kidney disease, or liver failure?"     Not asked 9. RECURRENT SYMPTOM: "Have you had leg swelling before?" If Yes, ask: "When was the last time?" "What happened that time?"     Yes 10. OTHER SYMPTOMS: "Do you have any other symptoms?" (e.g., chest pain, difficulty breathing)       Has many issues going on.  See above notes. 11. PREGNANCY: "Is there any chance you are pregnant?" "When was your last menstrual period?"       N/A due to age  Protocols used: Leg Swelling and Edema-A-AH

## 2022-10-02 NOTE — Progress Notes (Signed)
Consult received requesting PIV placement. At this time. There are no orders supporting PIV placement. Secure chat sent to nurse. Instructed to consult [facility]AST when infusions/tests ordered requiring PIV placement. Tomasita Morrow, RN [facility]

## 2022-10-02 NOTE — ED Provider Triage Note (Signed)
Emergency Medicine Provider Triage Evaluation Note  Natahsa Marian , a 62 y.o. female  was evaluated in triage.  Pt complains of left knee pain that has been going on "for a minute" and bilateral lower extremity swelling.  The patient  is a 62 y.o. female with medical history significant for COPD, chronic respiratory failure with hypoxia on 3 L O2 via [place], HFpEF (EF 55-60%), T2DM, HTN, HLD, hypothyroidism, depression/anxiety, morbid obesity.  She endorses bilateral lower extremity swelling.  She is on 3 L O2 at baseline at home.  She has been compliant with her home diuretic but cannot remember what it is called.  Review of Systems  Positive: Leg swelling, knee pain Negative: Chest pain, fever, chills, abdominal pain  Physical Exam  BP 135/87   Pulse 99   Temp 98.9 F (37.2 C) (Oral)   Resp 19   Ht 5\' 5"  (1.651 m)   Wt 124.3 kg   SpO2 96%   BMI 45.60 kg/m  Gen:   Awake, no distress   Resp:  Normal effort  MSK:   Moves extremities without difficulty, trace bilat LE edema, mild TTP of the left knee, no palpable warmth or effusion  Medical Decision Making  Medically screening exam initiated at 4:48 PM.  Appropriate orders placed.  Taniqua Issa was informed that the remainder of the evaluation will be completed by another provider, this initial triage assessment does not replace that evaluation, and the importance of remaining in the ED until their evaluation is complete.     Ernie Avena, MD 10/02/22 2116

## 2022-10-02 NOTE — ED Triage Notes (Addendum)
Pt BIB [facility] for BIL LE swelling x2.5 weeks. H/x COPD and CHF.  118/palp 100 HR 94% 2L baseline 115 CBG

## 2022-10-02 NOTE — Telephone Encounter (Addendum)
  Chief Complaint: Pt called in on the community line of [facility].  Bilateral leg swelling.   "I put that medicine under my tongue for my heart".    "I don't want this fluid to get to my heart".    I always go to the hospital and they put medicine in my IV to get rid of the fluid.   I need to go do that again. Symptoms: Bilateral leg swelling. Frequency: N/A Pertinent Negatives: Patient denies N/A Disposition: [x] ED /[] Urgent Care (no appt availability in office) / [] Appointment(In office/virtual)/ []  [facility]/ [] Home Care/ [] Refused Recommended Disposition /[]  Mobile Bus/ []  Follow-up with PCP Additional Notes: Pt instructed to call 911 which she was agreeable to doing.   Not a pt with Dr. Mikeal Hawthorne anymore per pt.  Has many issues.    See notes.  She mentioned her son was murdered and she was crying because it happened about a year ago.    I gave her emotional support.

## 2022-10-02 NOTE — ED Provider Notes (Signed)
[place] EMERGENCY DEPARTMENT AT [facility] Provider Note   HPI: Michele Owens is a 62 year old female with a past medical history as below notable for CHF presenting with lower extremity edema bilaterally.  She reports this has been ongoing over the last 1 to 2 weeks.  She reports her heart failure is getting worse.  She has not really had any shortness of breath or chest pain with the symptoms.  She has not had fevers chills or cough.  She reports she typically wears 2 L nasal cannula and has been stable on that.  Due to worsening lower extremity swelling bilaterally she wanted to be evaluated.  She denies 1 leg swelling worse than the other.  She has not had hemoptysis, recent surgery, has no cancer history.  She is unsure if she has had a DVT/PE before.  Past Medical History:  Diagnosis Date   Anxiety    Arthritis    Asthma    Chronic diastolic CHF (congestive heart failure) (HCC)    COPD (chronic obstructive pulmonary disease) (HCC)    Uses Oxygen at night   Depression    Diabetes mellitus without complication (HCC)    GERD (gastroesophageal reflux disease)    Gout    Headache    migraines   History of DVT of lower extremity    Hypertension    Thyroid cancer (HCC) 09/23/2016    Past Surgical History:  Procedure Laterality Date   BUNIONECTOMY Bilateral    CARDIAC CATHETERIZATION N/A 06/23/2015   Procedure: Right/Left Heart Cath and Coronary Angiography;  Surgeon: Laurey Morale, MD;  Location: [facility] INVASIVE CV LAB;  Service: Cardiovascular;  Laterality: N/A;   COLONOSCOPY WITH PROPOFOL N/A 12/15/2015   Procedure: COLONOSCOPY WITH PROPOFOL;  Surgeon: Dorena Cookey, MD;  Location: [facility] ENDOSCOPY;  Service: Endoscopy;  Laterality: N/A;   RIGHT HEART CATH N/A 10/01/2019   Procedure: RIGHT HEART CATH;  Surgeon: Laurey Morale, MD;  Location: [facility] INVASIVE CV LAB;  Service: Cardiovascular;  Laterality: N/A;   THYROIDECTOMY  09/19/2016   THYROIDECTOMY N/A 09/19/2016    Procedure: TOTAL THYROIDECTOMY;  Surgeon: Darnell Level, MD;  Location: [facility] OR;  Service: General;  Laterality: N/A;   TONSILLECTOMY     TOTAL ABDOMINAL HYSTERECTOMY  07/14/10     Social History   Tobacco Use   Smoking status: Former    Packs/day: 0.50    Years: 41.00    Additional pack years: 0.00    Total pack years: 20.50    Types: Cigarettes    Quit date: 07/2016    Years since quitting: 6.2   Smokeless tobacco: Never  Vaping Use   Vaping Use: Never used  Substance Use Topics   Alcohol use: Not Currently    Comment: quit 12 years ago   Drug use: Not Currently    Types: Marijuana, Cocaine    Comment: quit 12 years ago      Review of Systems  A complete ROS was performed with pertinent positives/negatives noted in the HPI.   Vitals:   10/02/22 1944 10/02/22 2251  BP:  (!) 122/59  Pulse:  (!) 102  Resp:  (!) 28  Temp: 99.4 F (37.4 C)   SpO2:  97%    Physical Exam Vitals and nursing note reviewed.  Constitutional:      General: She is not in acute distress.    Appearance: She is well-developed.  Cardiovascular:     Rate and Rhythm: Normal rate and regular rhythm.  Pulses: Normal pulses.     Heart sounds: Normal heart sounds. No murmur heard.    No friction rub. No gallop.  Pulmonary:     Effort: Pulmonary effort is normal. No respiratory distress.     Breath sounds: Normal breath sounds. No stridor. No wheezing, rhonchi or rales.  Abdominal:     General: Abdomen is flat. There is no distension.     Palpations: Abdomen is soft.     Tenderness: There is no abdominal tenderness. There is no guarding or rebound.  Musculoskeletal:        General: No swelling.     Cervical back: Neck supple.     Right lower leg: Edema present.     Left lower leg: Edema present.  Skin:    General: Skin is warm and dry.     Capillary Refill: Capillary refill takes less than 2 seconds.  Neurological:     Mental Status: She is alert.      Procedures  MDM:  Imaging/radiology results:  DG Knee Complete 4 Views Left  Result Date: 10/02/2022 CLINICAL DATA:  Left knee pain. EXAM: LEFT KNEE - COMPLETE 4+ VIEW COMPARISON:  Left knee radiograph dated 08/29/2021. FINDINGS: There is no acute fracture or dislocation. The bones are osteopenic. There is moderate arthritic changes with tricompartmental narrowing and spurring. Small suprapatellar effusion. The soft tissues are unremarkable. IMPRESSION: 1. No acute fracture or dislocation. 2. Moderate arthritic changes. Electronically Signed   By: Elgie Collard M.D.   On: 10/02/2022 18:37   DG Chest 2 View  Result Date: 10/02/2022 CLINICAL DATA:  Shortness of breath and right-sided neck pain. EXAM: CHEST - 2 VIEW COMPARISON:  07/07/2022 FINDINGS: AP and lateral views, both mildly degraded by patient size. Patient rotated left on the frontal. Surgical clips at the thoracic inlet. Midline trachea. Normal heart size. Atherosclerosis in the transverse aorta. No pleural effusion or pneumothorax. Low lung volumes with left greater than right base subsegmental atelectasis. IMPRESSION: No acute cardiopulmonary disease. Aortic Atherosclerosis (ICD10-I70.0). Electronically Signed   By: Jeronimo Greaves M.D.   On: 10/02/2022 16:31      EKG results: ECG on my interpretation is normal sinus rhythm and rate, without anatomical ischemia representing STEMI, New-onset Arrhythmia, or ischemic equivalent.    Lab results:  Results for orders placed or performed during the hospital encounter of 10/02/22 (from the past 24 hour(s))  Basic metabolic panel     Status: Abnormal   Collection Time: 10/02/22  3:33 PM  Result Value Ref Range   Sodium 141 135 - 145 mmol/L   Potassium 3.8 3.5 - 5.1 mmol/L   Chloride 97 (L) 98 - 111 mmol/L   CO2 27 22 - 32 mmol/L   Glucose, Bld 123 (H) 70 - 99 mg/dL   BUN 18 8 - 23 mg/dL   Creatinine, Ser 5.40 (H) 0.44 - 1.00 mg/dL   Calcium 9.5 8.9 - 98.1 mg/dL   GFR,  Estimated 59 (L) >60 mL/min   Anion gap 17 (H) 5 - 15  CBC     Status: Abnormal   Collection Time: 10/02/22  3:33 PM  Result Value Ref Range   WBC 17.7 (H) 4.0 - 10.5 K/uL   RBC 4.29 3.87 - 5.11 MIL/uL   Hemoglobin 12.4 12.0 - 15.0 g/dL   HCT 19.1 47.8 - 29.5 %   MCV 92.3 80.0 - 100.0 fL   MCH 28.9 26.0 - 34.0 pg   MCHC 31.3 30.0 - 36.0 g/dL  RDW 13.8 11.5 - 15.5 %   Platelets 341 150 - 400 K/uL   nRBC 0.0 0.0 - 0.2 %  Troponin I (High Sensitivity)     Status: None   Collection Time: 10/02/22  3:33 PM  Result Value Ref Range   Troponin I (High Sensitivity) 7 <18 ng/L  Brain natriuretic peptide     Status: None   Collection Time: 10/02/22  3:33 PM  Result Value Ref Range   B Natriuretic Peptide 8.9 0.0 - 100.0 pg/mL  D-dimer, quantitative     Status: None   Collection Time: 10/02/22  3:33 PM  Result Value Ref Range   D-Dimer, Quant 0.35 0.00 - 0.50 ug/mL-FEU  Troponin I (High Sensitivity)     Status: None   Collection Time: 10/02/22  9:38 PM  Result Value Ref Range   Troponin I (High Sensitivity) 7 <18 ng/L     Key medications administered in the ER:  Medications  acetaminophen (TYLENOL) tablet 1,000 mg (1,000 mg Oral Given 10/02/22 1704)  furosemide (LASIX) injection 20 mg (20 mg Intravenous Given 10/02/22 2241)   Medical decision making: -Vital signs stable. Patient afebrile, hemodynamically stable, and non-toxic appearing. -Patient's presentation is most consistent with acute complicated illness / injury requiring diagnostic workup.. Ilsa Bonello is a 62 y.o. female presenting to the emergency department with bilateral lower extremity edema.  -Per chart review, patient has a history of a provoked DVT during pregnancy. -Patient is presenting with lower extremity swelling.  She reports a history of CHF and feels as though she may need diuresis.  She denies any chest pain or shortness of breath.  She is on her baseline 2 L nasal cannula.  She was complaining of knee pain  in triage therefore x-ray of the left knee was obtained and does not show evidence of acute abnormality.  She has full range of motion of the knee do not suspect septic joint.  X-ray of her chest has been obtained and on my interpretation does not show evidence of acute cardiopulmonary abnormality.  There is no extensive pulmonary edema.  She has bilateral symmetric swelling and her dimer is normal do not suspect DVT/PE.  EKG is without STEMI, ischemic equivalent, or arrhythmia.  Initial troponin is normal at 7 with repeat troponin 7.  Delta troponin is 0.  Do not suspect ACS.  BNP is normal at 8.9.  Overall presentation is not consistent with decompensated heart failure.  Given worsening swelling will give dose of Lasix.  BMP does show slightly bumped creatinine.  Patient reports some difficulty ambulating but has been able to ambulate in her room with her cane.  I believe the patient is stable for outpatient follow-up.  Discharged with strict return precautions and recommended repeat kidney function testing with PCP.  Discussed this plan with the patient and she is in agreement.  Patient discharged.   Medical Decision Making Amount and/or Complexity of Data Reviewed Labs: ordered. Decision-making details documented in ED Course. Radiology: ordered and independent interpretation performed. Decision-making details documented in ED Course. ECG/medicine tests: ordered and independent interpretation performed. Decision-making details documented in ED Course.  Risk Prescription drug management.     The plan for this patient was discussed with Dr. Dalene Seltzer, who voiced agreement and who oversaw evaluation and treatment of this patient.  Marta Lamas, MD Emergency Medicine, PGY-3  Note: Dragon medical dictation software was used in the creation of this note.   Clinical Impression:  1. Peripheral edema  Rx / DC Orders ED Discharge Orders     None          Chase Caller,  MD 10/03/22 [place] Pigg    Alvira Monday, MD 10/03/22 [phone]

## 2022-10-03 MED ORDER — FUROSEMIDE 10 MG/ML IJ SOLN
10.0000 mg | Freq: Once | INTRAMUSCULAR | Status: AC
  Administered 2022-10-03: 10 mg via INTRAVENOUS
  Filled 2022-10-03: qty 2

## 2022-10-03 NOTE — Discharge Instructions (Signed)
Michele Owens:  Thank you for allowing [place] to take care of you today.  We hope you begin feeling better soon.  To-Do: Please follow-up with your primary doctor. Please also call your nephrologist to set up an appointment.  Your labs showed a slight bump in your creatinine.  Please call your PCP or your nephrologist to have this rechecked. Please return to the Emergency Department or call 911 if you experience chest pain, shortness of breath, severe pain, severe fever, altered mental status, or have any reason to think that you need emergency medical care.  Thank you again.  Hope you feel better soon.  Department of Emergency Medicine [facility]

## 2022-10-03 NOTE — ED Notes (Signed)
Pt states she does not have any transportation  home from the hospital. Taxi voucher provided to the pt going to the home address in the chart. Pt verified she was able to ambulate from taxi to the apartment safely. [employer] notified and has driver in route.

## 2022-10-11 ENCOUNTER — Other Ambulatory Visit: Payer: Self-pay

## 2022-10-11 ENCOUNTER — Emergency Department (HOSPITAL_COMMUNITY)
Admission: EM | Admit: 2022-10-11 | Discharge: 2022-10-12 | Disposition: A | Discharge status: Home / Self Care | Payer: Medicaid Other | Attending: Emergency Medicine | Admitting: Emergency Medicine

## 2022-10-11 ENCOUNTER — Emergency Department (HOSPITAL_COMMUNITY): Payer: Medicaid Other

## 2022-10-11 ENCOUNTER — Emergency Department (HOSPITAL_BASED_OUTPATIENT_CLINIC_OR_DEPARTMENT_OTHER): Payer: Medicaid Other

## 2022-10-11 DIAGNOSIS — D72829 Elevated white blood cell count, unspecified: Secondary | ICD-10-CM | POA: Diagnosis not present

## 2022-10-11 DIAGNOSIS — R600 Localized edema: Secondary | ICD-10-CM

## 2022-10-11 DIAGNOSIS — J449 Chronic obstructive pulmonary disease, unspecified: Secondary | ICD-10-CM | POA: Insufficient documentation

## 2022-10-11 DIAGNOSIS — Z7982 Long term (current) use of aspirin: Secondary | ICD-10-CM | POA: Insufficient documentation

## 2022-10-11 DIAGNOSIS — I110 Hypertensive heart disease with heart failure: Secondary | ICD-10-CM | POA: Diagnosis not present

## 2022-10-11 DIAGNOSIS — I5033 Acute on chronic diastolic (congestive) heart failure: Secondary | ICD-10-CM | POA: Insufficient documentation

## 2022-10-11 DIAGNOSIS — M1712 Unilateral primary osteoarthritis, left knee: Secondary | ICD-10-CM | POA: Diagnosis not present

## 2022-10-11 DIAGNOSIS — R609 Edema, unspecified: Secondary | ICD-10-CM | POA: Diagnosis not present

## 2022-10-11 DIAGNOSIS — Z79899 Other long term (current) drug therapy: Secondary | ICD-10-CM | POA: Diagnosis not present

## 2022-10-11 LAB — CBC WITH DIFFERENTIAL/PLATELET
Abs Immature Granulocytes: 0.13 10*3/uL — ABNORMAL HIGH (ref 0.00–0.07)
Basophils Absolute: 0.1 10*3/uL (ref 0.0–0.1)
Basophils Relative: 0 %
Eosinophils Absolute: 0.2 10*3/uL (ref 0.0–0.5)
Eosinophils Relative: 2 %
HCT: 39.3 % (ref 36.0–46.0)
Hemoglobin: 12.4 g/dL (ref 12.0–15.0)
Immature Granulocytes: 1 %
Lymphocytes Relative: 19 %
Lymphs Abs: 2.9 10*3/uL (ref 0.7–4.0)
MCH: 29.8 pg (ref 26.0–34.0)
MCHC: 31.6 g/dL (ref 30.0–36.0)
MCV: 94.5 fL (ref 80.0–100.0)
Monocytes Absolute: 0.9 10*3/uL (ref 0.1–1.0)
Monocytes Relative: 6 %
Neutro Abs: 11.4 10*3/uL — ABNORMAL HIGH (ref 1.7–7.7)
Neutrophils Relative %: 72 %
Platelets: 376 10*3/uL (ref 150–400)
RBC: 4.16 MIL/uL (ref 3.87–5.11)
RDW: 14.1 % (ref 11.5–15.5)
WBC: 15.6 10*3/uL — ABNORMAL HIGH (ref 4.0–10.5)
nRBC: 0 % (ref 0.0–0.2)

## 2022-10-11 LAB — BRAIN NATRIURETIC PEPTIDE: B Natriuretic Peptide: 12.1 pg/mL (ref 0.0–100.0)

## 2022-10-11 LAB — BASIC METABOLIC PANEL
Anion gap: 10 (ref 5–15)
BUN: 13 mg/dL (ref 8–23)
CO2: 32 mmol/L (ref 22–32)
Calcium: 9.3 mg/dL (ref 8.9–10.3)
Chloride: 99 mmol/L (ref 98–111)
Creatinine, Ser: 0.83 mg/dL (ref 0.44–1.00)
GFR, Estimated: >60 mL/min (ref >60)
Glucose, Bld: 99 mg/dL (ref 70–99)
Potassium: 3.8 mmol/L (ref 3.5–5.1)
Sodium: 141 mmol/L (ref 135–145)

## 2022-10-11 MED ORDER — LIDOCAINE 5 % EX PTCH
1.0000 | MEDICATED_PATCH | CUTANEOUS | Status: DC
  Administered 2022-10-11: 1 via TRANSDERMAL
  Filled 2022-10-11: qty 1

## 2022-10-11 MED ORDER — ACETAMINOPHEN 325 MG PO TABS
650.0000 mg | ORAL_TABLET | Freq: Once | ORAL | Status: AC
  Administered 2022-10-11: 650 mg via ORAL
  Filled 2022-10-11: qty 2

## 2022-10-11 MED ORDER — FUROSEMIDE 10 MG/ML IJ SOLN
60.0000 mg | Freq: Once | INTRAMUSCULAR | Status: AC
  Administered 2022-10-12: 60 mg via INTRAVENOUS
  Filled 2022-10-11: qty 6

## 2022-10-11 MED ORDER — CYCLOBENZAPRINE HCL 10 MG PO TABS
5.0000 mg | ORAL_TABLET | Freq: Once | ORAL | Status: AC
  Administered 2022-10-11: 5 mg via ORAL
  Filled 2022-10-11: qty 1

## 2022-10-11 NOTE — ED Provider Notes (Signed)
Care transferred from [facility], PA-C at time of sign out. See their note for full assessment.   Briefly: Patient is 62 y.o. female with history of prediabetes, hypertension, obesity, obesity hypoventilation syndrome, COPD, DVT in pregnancy, CHF  who presents to the ED with swelling to BLE.     Plan: Plan per previous PA-C: pending lasix and dispo home following.  Labs Reviewed  CBC WITH DIFFERENTIAL/PLATELET - Abnormal; Notable for the following components:      Result Value   WBC 15.6 (*)    Neutro Abs 11.4 (*)    Abs Immature Granulocytes 0.13 (*)    All other components within normal limits  URINALYSIS, ROUTINE W REFLEX MICROSCOPIC - Abnormal; Notable for the following components:   Color, Urine COLORLESS (*)    Specific Gravity, Urine 1.004 (*)    All other components within normal limits  BASIC METABOLIC PANEL  BRAIN NATRIURETIC PEPTIDE    Clinical Course as of 10/12/22 0555  Thu Oct 12, 2022  0354 Reevaluated patient.  Patient requesting that we run her urine so that her doctors can see it.  Discussed with patient that we will obtain her urine today.  Patient also requesting prescription for lidocaine patch.  Discussed with patient discharge treatment plan.  Answered all the questions.  Patient appears safe for discharge at this time. [SB]    Clinical Course User Index [SB] Bryndan Bilyk A, PA-C     Labs overall unremarkable, leukocytosis improving from previous value.  Discussed with patient after diuresis with Lasix that she will be discharged home.  Patient agreeable at this time.  Patient able to speak in clear complete sentences during my examination.  Discussed with patient importance of following up with her care team.  Patient requesting Lidoderm patches as a prescription.  Prescription provided today.  Previous provider provided patient with ambulatory referral to cardiology.  Supportive care and strict return precautions discussed with patient. Patient acknowledges  and verbalizes understanding. Pt appears safe for discharge. Follow up as indicated in discharge paperwork.    This chart was dictated using voice recognition software, Dragon. Despite the best efforts of this provider to proofread and correct errors, errors may still occur which can change documentation meaning.  ED Discharge Orders          Ordered    lidocaine (LIDODERM) 5 %  Every 24 hours        10/12/22 0355    Ambulatory referral to Cardiology       Comments: If you have not heard from the Cardiology office within the next 72 hours please call [phone].   10/11/22 2324             Amirr Achord A, PA-C 10/12/22 0557    Sabas Sous, MD 10/12/22 [phone]

## 2022-10-11 NOTE — ED Triage Notes (Signed)
Patient from home via EMS for eval of bilateral knee pain and swelling since 1 week when she was here for same.

## 2022-10-11 NOTE — ED Notes (Signed)
Patient transported to Ultrasound 
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
## 2022-10-11 NOTE — Progress Notes (Signed)
LLE venous duplex has been completed.  Preliminary results given to [facility] PA-C

## 2022-10-12 LAB — URINALYSIS, ROUTINE W REFLEX MICROSCOPIC
Bilirubin Urine: NEGATIVE
Glucose, UA: NEGATIVE mg/dL
Hgb urine dipstick: NEGATIVE
Ketones, ur: NEGATIVE mg/dL
Leukocytes,Ua: NEGATIVE
Nitrite: NEGATIVE
Protein, ur: NEGATIVE mg/dL
Specific Gravity, Urine: 1.004 — ABNORMAL LOW (ref 1.005–1.030)
pH: 7 (ref 5.0–8.0)

## 2022-10-12 MED ORDER — LIDOCAINE 5 % EX PTCH: 1.0000 | MEDICATED_PATCH | CUTANEOUS | 0 refills | Status: DC

## 2022-10-12 NOTE — Discharge Instructions (Addendum)
Follow-up with your pain management provider for your pain.  I placed a consult if you establish care with a cardiologist have also placed a number in your discharge paperwork  Follow a low-salt diet, elevate your legs  Return for new or worsening symptoms

## 2022-10-12 NOTE — ED Notes (Signed)
[facility] Called. No ETA

## 2022-10-12 NOTE — ED Notes (Signed)
Awaiting [facility] transportation at this time. Pt aware.

## 2022-10-16 ENCOUNTER — Telehealth: Payer: Self-pay | Admitting: Licensed Clinical Social Worker

## 2022-10-16 NOTE — Transitions of Care (Post Inpatient/ED Visit) (Signed)
   10/16/2022  Name: Michele Owens MRN: [id number] DOB: 08-22-1960  Today's TOC FU Call Status: Today's TOC FU Call Status:: Unsuccessul Call (1st Attempt) Unsuccessful Call (1st Attempt) Date: 10/16/22  Transition Care Management Follow-up Telephone Call Date of Discharge: 10/12/22 Discharge Facility: [facility]) Type of Discharge: Emergency Department  [facility] LCSW unable to leave a voice message.  Dickie La, BSW, MSW, [employer] LCSW [facility]  [facility] [email or web address] Phone: [phone]

## 2022-10-23 ENCOUNTER — Emergency Department (HOSPITAL_COMMUNITY): Payer: Medicaid Other

## 2022-10-23 ENCOUNTER — Encounter (HOSPITAL_COMMUNITY): Payer: Self-pay

## 2022-10-23 ENCOUNTER — Inpatient Hospital Stay (HOSPITAL_COMMUNITY)
Admission: EM | Admit: 2022-10-23 | Discharge: 2022-10-28 | DRG: 291 | Disposition: A | Discharge status: Home / Self Care | Payer: Medicaid Other | Attending: Family Medicine | Admitting: Family Medicine

## 2022-10-23 ENCOUNTER — Other Ambulatory Visit: Payer: Self-pay

## 2022-10-23 DIAGNOSIS — Z792 Long term (current) use of antibiotics: Secondary | ICD-10-CM

## 2022-10-23 DIAGNOSIS — E785 Hyperlipidemia, unspecified: Secondary | ICD-10-CM | POA: Diagnosis present

## 2022-10-23 DIAGNOSIS — E890 Postprocedural hypothyroidism: Secondary | ICD-10-CM | POA: Diagnosis present

## 2022-10-23 DIAGNOSIS — E119 Type 2 diabetes mellitus without complications: Secondary | ICD-10-CM | POA: Diagnosis present

## 2022-10-23 DIAGNOSIS — J189 Pneumonia, unspecified organism: Secondary | ICD-10-CM | POA: Diagnosis present

## 2022-10-23 DIAGNOSIS — J9601 Acute respiratory failure with hypoxia: Secondary | ICD-10-CM | POA: Diagnosis not present

## 2022-10-23 DIAGNOSIS — Z886 Allergy status to analgesic agent status: Secondary | ICD-10-CM

## 2022-10-23 DIAGNOSIS — Z808 Family history of malignant neoplasm of other organs or systems: Secondary | ICD-10-CM

## 2022-10-23 DIAGNOSIS — M109 Gout, unspecified: Secondary | ICD-10-CM | POA: Diagnosis present

## 2022-10-23 DIAGNOSIS — E876 Hypokalemia: Secondary | ICD-10-CM | POA: Diagnosis not present

## 2022-10-23 DIAGNOSIS — F32A Depression, unspecified: Secondary | ICD-10-CM | POA: Diagnosis present

## 2022-10-23 DIAGNOSIS — I110 Hypertensive heart disease with heart failure: Principal | ICD-10-CM | POA: Diagnosis present

## 2022-10-23 DIAGNOSIS — J9621 Acute and chronic respiratory failure with hypoxia: Secondary | ICD-10-CM | POA: Diagnosis present

## 2022-10-23 DIAGNOSIS — I272 Pulmonary hypertension, unspecified: Secondary | ICD-10-CM | POA: Diagnosis present

## 2022-10-23 DIAGNOSIS — J9602 Acute respiratory failure with hypercapnia: Secondary | ICD-10-CM | POA: Diagnosis not present

## 2022-10-23 DIAGNOSIS — Z85850 Personal history of malignant neoplasm of thyroid: Secondary | ICD-10-CM

## 2022-10-23 DIAGNOSIS — I5033 Acute on chronic diastolic (congestive) heart failure: Secondary | ICD-10-CM | POA: Diagnosis present

## 2022-10-23 DIAGNOSIS — Z91138 Patient's unintentional underdosing of medication regimen for other reason: Secondary | ICD-10-CM

## 2022-10-23 DIAGNOSIS — F419 Anxiety disorder, unspecified: Secondary | ICD-10-CM | POA: Diagnosis present

## 2022-10-23 DIAGNOSIS — Z9981 Dependence on supplemental oxygen: Secondary | ICD-10-CM | POA: Diagnosis not present

## 2022-10-23 DIAGNOSIS — T424X5A Adverse effect of benzodiazepines, initial encounter: Secondary | ICD-10-CM | POA: Diagnosis present

## 2022-10-23 DIAGNOSIS — D509 Iron deficiency anemia, unspecified: Secondary | ICD-10-CM | POA: Diagnosis present

## 2022-10-23 DIAGNOSIS — J9622 Acute and chronic respiratory failure with hypercapnia: Secondary | ICD-10-CM | POA: Diagnosis present

## 2022-10-23 DIAGNOSIS — Z79899 Other long term (current) drug therapy: Secondary | ICD-10-CM

## 2022-10-23 DIAGNOSIS — E662 Morbid (severe) obesity with alveolar hypoventilation: Secondary | ICD-10-CM | POA: Diagnosis present

## 2022-10-23 DIAGNOSIS — Z1152 Encounter for screening for COVID-19: Secondary | ICD-10-CM

## 2022-10-23 DIAGNOSIS — Z90710 Acquired absence of both cervix and uterus: Secondary | ICD-10-CM

## 2022-10-23 DIAGNOSIS — R0600 Dyspnea, unspecified: Secondary | ICD-10-CM | POA: Diagnosis not present

## 2022-10-23 DIAGNOSIS — Z5320 Procedure and treatment not carried out because of patient's decision for unspecified reasons: Secondary | ICD-10-CM | POA: Diagnosis present

## 2022-10-23 DIAGNOSIS — T50916A Underdosing of multiple unspecified drugs, medicaments and biological substances, initial encounter: Secondary | ICD-10-CM | POA: Diagnosis present

## 2022-10-23 DIAGNOSIS — K219 Gastro-esophageal reflux disease without esophagitis: Secondary | ICD-10-CM | POA: Diagnosis present

## 2022-10-23 DIAGNOSIS — J440 Chronic obstructive pulmonary disease with acute lower respiratory infection: Secondary | ICD-10-CM | POA: Diagnosis present

## 2022-10-23 DIAGNOSIS — Z86718 Personal history of other venous thrombosis and embolism: Secondary | ICD-10-CM

## 2022-10-23 DIAGNOSIS — Z87891 Personal history of nicotine dependence: Secondary | ICD-10-CM

## 2022-10-23 DIAGNOSIS — Z6841 Body Mass Index (BMI) 40.0 and over, adult: Secondary | ICD-10-CM

## 2022-10-23 DIAGNOSIS — Z833 Family history of diabetes mellitus: Secondary | ICD-10-CM

## 2022-10-23 DIAGNOSIS — T40495A Adverse effect of other synthetic narcotics, initial encounter: Secondary | ICD-10-CM | POA: Diagnosis present

## 2022-10-23 DIAGNOSIS — Z888 Allergy status to other drugs, medicaments and biological substances status: Secondary | ICD-10-CM

## 2022-10-23 DIAGNOSIS — R400 Somnolence: Secondary | ICD-10-CM | POA: Diagnosis present

## 2022-10-23 DIAGNOSIS — Z7982 Long term (current) use of aspirin: Secondary | ICD-10-CM

## 2022-10-23 DIAGNOSIS — Z91030 Bee allergy status: Secondary | ICD-10-CM

## 2022-10-23 DIAGNOSIS — Z79890 Hormone replacement therapy (postmenopausal): Secondary | ICD-10-CM

## 2022-10-23 DIAGNOSIS — Z79891 Long term (current) use of opiate analgesic: Secondary | ICD-10-CM

## 2022-10-23 HISTORY — DX: Morbid (severe) obesity due to excess calories: E66.01

## 2022-10-23 LAB — TROPONIN I (HIGH SENSITIVITY)
Troponin I (High Sensitivity): 35 ng/L — ABNORMAL HIGH (ref <18)
Troponin I (High Sensitivity): 35 ng/L — ABNORMAL HIGH (ref <18)

## 2022-10-23 LAB — CBC
HCT: 34.6 % — ABNORMAL LOW (ref 36.0–46.0)
Hemoglobin: 10.7 g/dL — ABNORMAL LOW (ref 12.0–15.0)
MCH: 29.9 pg (ref 26.0–34.0)
MCHC: 30.9 g/dL (ref 30.0–36.0)
MCV: 96.6 fL (ref 80.0–100.0)
Platelets: 268 10*3/uL (ref 150–400)
RBC: 3.58 MIL/uL — ABNORMAL LOW (ref 3.87–5.11)
RDW: 14.5 % (ref 11.5–15.5)
WBC: 19.1 10*3/uL — ABNORMAL HIGH (ref 4.0–10.5)
nRBC: 0.7 % — ABNORMAL HIGH (ref 0.0–0.2)

## 2022-10-23 LAB — LACTIC ACID, PLASMA
Lactic Acid, Venous: 0.9 mmol/L (ref 0.5–1.9)
Lactic Acid, Venous: 3.2 mmol/L (ref 0.5–1.9)

## 2022-10-23 LAB — I-STAT ARTERIAL BLOOD GAS, ED
Acid-Base Excess: 8 mmol/L — ABNORMAL HIGH (ref 0.0–2.0)
Bicarbonate: 35 mmol/L — ABNORMAL HIGH (ref 20.0–28.0)
Calcium, Ion: 1.13 mmol/L — ABNORMAL LOW (ref 1.15–1.40)
HCT: 35 % — ABNORMAL LOW (ref 36.0–46.0)
Hemoglobin: 11.9 g/dL — ABNORMAL LOW (ref 12.0–15.0)
O2 Saturation: 91 %
Patient temperature: 98.6
Potassium: 3.1 mmol/L — ABNORMAL LOW (ref 3.5–5.1)
Sodium: 138 mmol/L (ref 135–145)
TCO2: 37 mmol/L — ABNORMAL HIGH (ref 22–32)
pCO2 arterial: 59.7 mmHg — ABNORMAL HIGH (ref 32–48)
pH, Arterial: 7.376 (ref 7.35–7.45)
pO2, Arterial: 65 mmHg — ABNORMAL LOW (ref 83–108)

## 2022-10-23 LAB — I-STAT VENOUS BLOOD GAS, ED
Acid-Base Excess: 8 mmol/L — ABNORMAL HIGH (ref 0.0–2.0)
Acid-Base Excess: 8 mmol/L — ABNORMAL HIGH (ref 0.0–2.0)
Bicarbonate: 32.3 mmol/L — ABNORMAL HIGH (ref 20.0–28.0)
Bicarbonate: 34.5 mmol/L — ABNORMAL HIGH (ref 20.0–28.0)
Calcium, Ion: 1.05 mmol/L — ABNORMAL LOW (ref 1.15–1.40)
Calcium, Ion: 1.07 mmol/L — ABNORMAL LOW (ref 1.15–1.40)
HCT: 36 % (ref 36.0–46.0)
HCT: 33 % — ABNORMAL LOW (ref 36.0–46.0)
Hemoglobin: 12.2 g/dL (ref 12.0–15.0)
Hemoglobin: 11.2 g/dL — ABNORMAL LOW (ref 12.0–15.0)
O2 Saturation: 90 %
O2 Saturation: 84 %
Potassium: 3.4 mmol/L — ABNORMAL LOW (ref 3.5–5.1)
Potassium: 3.3 mmol/L — ABNORMAL LOW (ref 3.5–5.1)
Sodium: 137 mmol/L (ref 135–145)
Sodium: 138 mmol/L (ref 135–145)
TCO2: 34 mmol/L — ABNORMAL HIGH (ref 22–32)
TCO2: 36 mmol/L — ABNORMAL HIGH (ref 22–32)
pCO2, Ven: 44.9 mmHg (ref 44–60)
pCO2, Ven: 54 mmHg (ref 44–60)
pH, Ven: 7.465 — ABNORMAL HIGH (ref 7.25–7.43)
pH, Ven: 7.414 (ref 7.25–7.43)
pO2, Ven: 55 mmHg — ABNORMAL HIGH (ref 32–45)
pO2, Ven: 49 mmHg — ABNORMAL HIGH (ref 32–45)

## 2022-10-23 LAB — URINALYSIS, COMPLETE (UACMP) WITH MICROSCOPIC
Bacteria, UA: NONE SEEN
Bilirubin Urine: NEGATIVE
Glucose, UA: NEGATIVE mg/dL
Hgb urine dipstick: NEGATIVE
Ketones, ur: NEGATIVE mg/dL
Leukocytes,Ua: NEGATIVE
Nitrite: NEGATIVE
Protein, ur: NEGATIVE mg/dL
Specific Gravity, Urine: 1.008 (ref 1.005–1.030)
pH: 7 (ref 5.0–8.0)

## 2022-10-23 LAB — BASIC METABOLIC PANEL
Anion gap: 9 (ref 5–15)
BUN: 5 mg/dL — ABNORMAL LOW (ref 8–23)
CO2: 31 mmol/L (ref 22–32)
Calcium: 8.5 mg/dL — ABNORMAL LOW (ref 8.9–10.3)
Chloride: 98 mmol/L (ref 98–111)
Creatinine, Ser: 0.85 mg/dL (ref 0.44–1.00)
GFR, Estimated: >60 mL/min (ref >60)
Glucose, Bld: 123 mg/dL — ABNORMAL HIGH (ref 70–99)
Potassium: 3.6 mmol/L (ref 3.5–5.1)
Sodium: 138 mmol/L (ref 135–145)

## 2022-10-23 LAB — CBG MONITORING, ED: Glucose-Capillary: 121 mg/dL — ABNORMAL HIGH (ref 70–99)

## 2022-10-23 LAB — RAPID URINE DRUG SCREEN, HOSP PERFORMED
Amphetamines: NOT DETECTED
Barbiturates: POSITIVE — AB
Benzodiazepines: NOT DETECTED
Cocaine: NOT DETECTED
Opiates: NOT DETECTED
Tetrahydrocannabinol: NOT DETECTED

## 2022-10-23 LAB — AMMONIA: Ammonia: 30 umol/L (ref 9–35)

## 2022-10-23 LAB — HIV ANTIBODY (ROUTINE TESTING W REFLEX): HIV Screen 4th Generation wRfx: NONREACTIVE

## 2022-10-23 LAB — BRAIN NATRIURETIC PEPTIDE: B Natriuretic Peptide: 130.6 pg/mL — ABNORMAL HIGH (ref 0.0–100.0)

## 2022-10-23 LAB — PROCALCITONIN: Procalcitonin: <0.1 ng/mL

## 2022-10-23 LAB — TSH: TSH: 1.644 u[IU]/mL (ref 0.350–4.500)

## 2022-10-23 LAB — SARS CORONAVIRUS 2 BY RT PCR: SARS Coronavirus 2 by RT PCR: NEGATIVE

## 2022-10-23 MED ORDER — LORATADINE 10 MG PO TABS: 10.0000 mg | ORAL_TABLET | Freq: Every day | ORAL | Status: DC | PRN

## 2022-10-23 MED ORDER — BUDESONIDE 0.25 MG/2ML IN SUSP
0.2500 mg | Freq: Two times a day (BID) | RESPIRATORY_TRACT | Status: DC
  Administered 2022-10-23 – 2022-10-28 (×10): 0.25 mg via RESPIRATORY_TRACT
  Filled 2022-10-23 (×10): qty 2

## 2022-10-23 MED ORDER — IPRATROPIUM-ALBUTEROL 0.5-2.5 (3) MG/3ML IN SOLN
3.0000 mL | Freq: Once | RESPIRATORY_TRACT | Status: AC
  Administered 2022-10-23: 3 mL via RESPIRATORY_TRACT
  Filled 2022-10-23: qty 3

## 2022-10-23 MED ORDER — LOSARTAN POTASSIUM 25 MG PO TABS
25.0000 mg | ORAL_TABLET | Freq: Every day | ORAL | Status: DC
  Administered 2022-10-24 – 2022-10-28 (×5): 25 mg via ORAL
  Filled 2022-10-23 (×5): qty 1

## 2022-10-23 MED ORDER — ACETAMINOPHEN 650 MG RE SUPP: 650.0000 mg | Freq: Four times a day (QID) | RECTAL | Status: DC | PRN

## 2022-10-23 MED ORDER — GUAIFENESIN ER 600 MG PO TB12
600.0000 mg | ORAL_TABLET | Freq: Two times a day (BID) | ORAL | Status: DC
  Administered 2022-10-23 – 2022-10-28 (×10): 600 mg via ORAL
  Filled 2022-10-23 (×10): qty 1

## 2022-10-23 MED ORDER — NALOXONE HCL 0.4 MG/ML IJ SOLN
0.4000 mg | Freq: Once | INTRAMUSCULAR | Status: DC
  Filled 2022-10-23: qty 1

## 2022-10-23 MED ORDER — FUROSEMIDE 10 MG/ML IJ SOLN
60.0000 mg | Freq: Two times a day (BID) | INTRAMUSCULAR | Status: DC
  Administered 2022-10-24 (×3): 60 mg via INTRAVENOUS
  Filled 2022-10-23 (×3): qty 6

## 2022-10-23 MED ORDER — LEVALBUTEROL HCL 0.63 MG/3ML IN NEBU
0.6300 mg | INHALATION_SOLUTION | Freq: Four times a day (QID) | RESPIRATORY_TRACT | Status: DC
  Administered 2022-10-23: 0.63 mg via RESPIRATORY_TRACT
  Filled 2022-10-23: qty 3

## 2022-10-23 MED ORDER — PANTOPRAZOLE SODIUM 40 MG PO TBEC
40.0000 mg | DELAYED_RELEASE_TABLET | Freq: Every day | ORAL | Status: DC
  Administered 2022-10-24 – 2022-10-28 (×5): 40 mg via ORAL
  Filled 2022-10-23 (×5): qty 1

## 2022-10-23 MED ORDER — ROSUVASTATIN CALCIUM 5 MG PO TABS
10.0000 mg | ORAL_TABLET | Freq: Every evening | ORAL | Status: DC
  Administered 2022-10-23 – 2022-10-27 (×5): 10 mg via ORAL
  Filled 2022-10-23 (×5): qty 2

## 2022-10-23 MED ORDER — VALACYCLOVIR HCL 500 MG PO TABS
1000.0000 mg | ORAL_TABLET | Freq: Every day | ORAL | Status: DC
  Administered 2022-10-23 – 2022-10-28 (×6): 1000 mg via ORAL
  Filled 2022-10-23 (×7): qty 2

## 2022-10-23 MED ORDER — ALBUTEROL SULFATE (2.5 MG/3ML) 0.083% IN NEBU: 2.5000 mg | INHALATION_SOLUTION | RESPIRATORY_TRACT | Status: DC | PRN

## 2022-10-23 MED ORDER — FERROUS SULFATE 325 (65 FE) MG PO TABS
325.0000 mg | ORAL_TABLET | Freq: Every day | ORAL | Status: DC
  Administered 2022-10-24 – 2022-10-26 (×3): 325 mg via ORAL
  Filled 2022-10-23 (×3): qty 1

## 2022-10-23 MED ORDER — ACETAMINOPHEN 325 MG PO TABS
650.0000 mg | ORAL_TABLET | Freq: Four times a day (QID) | ORAL | Status: DC | PRN
  Administered 2022-10-23 – 2022-10-27 (×5): 650 mg via ORAL
  Filled 2022-10-23 (×6): qty 2

## 2022-10-23 MED ORDER — OXYCODONE HCL 5 MG PO TABS
5.0000 mg | ORAL_TABLET | Freq: Once | ORAL | Status: AC
  Administered 2022-10-23: 5 mg via ORAL
  Filled 2022-10-23: qty 1

## 2022-10-23 MED ORDER — FUROSEMIDE 10 MG/ML IJ SOLN
60.0000 mg | Freq: Once | INTRAMUSCULAR | Status: AC
  Administered 2022-10-23: 60 mg via INTRAVENOUS
  Filled 2022-10-23: qty 6

## 2022-10-23 MED ORDER — ONDANSETRON HCL 4 MG PO TABS: 4.0000 mg | ORAL_TABLET | Freq: Four times a day (QID) | ORAL | Status: DC | PRN

## 2022-10-23 MED ORDER — BUSPIRONE HCL 5 MG PO TABS
5.0000 mg | ORAL_TABLET | Freq: Three times a day (TID) | ORAL | Status: DC
  Administered 2022-10-23 – 2022-10-28 (×14): 5 mg via ORAL
  Filled 2022-10-23 (×14): qty 1

## 2022-10-23 MED ORDER — NITROGLYCERIN 0.4 MG SL SUBL: 0.4000 mg | SUBLINGUAL_TABLET | SUBLINGUAL | Status: DC | PRN

## 2022-10-23 MED ORDER — ARFORMOTEROL TARTRATE 15 MCG/2ML IN NEBU
15.0000 ug | INHALATION_SOLUTION | Freq: Two times a day (BID) | RESPIRATORY_TRACT | Status: DC
  Administered 2022-10-23 – 2022-10-28 (×10): 15 ug via RESPIRATORY_TRACT
  Filled 2022-10-23 (×10): qty 2

## 2022-10-23 MED ORDER — LISINOPRIL 5 MG PO TABS: 5.0000 mg | ORAL_TABLET | Freq: Every day | ORAL | Status: DC

## 2022-10-23 MED ORDER — RIFAXIMIN 550 MG PO TABS
550.0000 mg | ORAL_TABLET | Freq: Three times a day (TID) | ORAL | Status: DC
  Administered 2022-10-23 – 2022-10-28 (×14): 550 mg via ORAL
  Filled 2022-10-23 (×18): qty 1

## 2022-10-23 MED ORDER — ACETAMINOPHEN 500 MG PO TABS
1000.0000 mg | ORAL_TABLET | Freq: Once | ORAL | Status: AC
  Administered 2022-10-23: 1000 mg via ORAL
  Filled 2022-10-23: qty 2

## 2022-10-23 MED ORDER — CARVEDILOL 3.125 MG PO TABS: 3.1250 mg | ORAL_TABLET | Freq: Two times a day (BID) | ORAL | Status: DC

## 2022-10-23 MED ORDER — ONDANSETRON HCL 4 MG/2ML IJ SOLN: 4.0000 mg | Freq: Four times a day (QID) | INTRAMUSCULAR | Status: DC | PRN

## 2022-10-23 MED ORDER — AMMONIA AROMATIC IN INHA: 1.0000 | Freq: Once | RESPIRATORY_TRACT | Status: DC

## 2022-10-23 MED ORDER — ENOXAPARIN SODIUM 60 MG/0.6ML IJ SOSY
60.0000 mg | PREFILLED_SYRINGE | INTRAMUSCULAR | Status: DC
  Administered 2022-10-23 – 2022-10-27 (×5): 60 mg via SUBCUTANEOUS
  Filled 2022-10-23 (×5): qty 0.6

## 2022-10-23 MED ORDER — POTASSIUM CHLORIDE CRYS ER 20 MEQ PO TBCR
40.0000 meq | EXTENDED_RELEASE_TABLET | Freq: Two times a day (BID) | ORAL | Status: AC
  Administered 2022-10-23 – 2022-10-24 (×2): 40 meq via ORAL
  Filled 2022-10-23 (×2): qty 2

## 2022-10-23 MED ORDER — ASPIRIN 81 MG PO TBEC
81.0000 mg | DELAYED_RELEASE_TABLET | Freq: Every day | ORAL | Status: DC
  Administered 2022-10-23 – 2022-10-27 (×5): 81 mg via ORAL
  Filled 2022-10-23 (×6): qty 1

## 2022-10-23 MED ORDER — SENNOSIDES-DOCUSATE SODIUM 8.6-50 MG PO TABS
1.0000 | ORAL_TABLET | Freq: Every day | ORAL | Status: DC
  Administered 2022-10-24 – 2022-10-28 (×5): 1 via ORAL
  Filled 2022-10-23 (×5): qty 1

## 2022-10-23 MED ORDER — HYDRALAZINE HCL 20 MG/ML IJ SOLN: 10.0000 mg | Freq: Four times a day (QID) | INTRAMUSCULAR | Status: DC | PRN

## 2022-10-23 MED ORDER — LEVOTHYROXINE SODIUM 112 MCG PO TABS
224.0000 ug | ORAL_TABLET | Freq: Every day | ORAL | Status: DC
  Administered 2022-10-24 – 2022-10-28 (×5): 224 ug via ORAL
  Filled 2022-10-23 (×5): qty 2

## 2022-10-23 MED ORDER — POLYETHYLENE GLYCOL 3350 17 G PO PACK
17.0000 g | PACK | Freq: Every day | ORAL | Status: DC | PRN
  Administered 2022-10-24: 17 g via ORAL
  Filled 2022-10-23: qty 1

## 2022-10-23 MED ORDER — ALBUTEROL SULFATE HFA 108 (90 BASE) MCG/ACT IN AERS: 1.0000 | INHALATION_SPRAY | Freq: Four times a day (QID) | RESPIRATORY_TRACT | Status: DC | PRN

## 2022-10-23 MED ORDER — IPRATROPIUM BROMIDE 0.02 % IN SOLN
0.5000 mg | Freq: Four times a day (QID) | RESPIRATORY_TRACT | Status: DC
  Administered 2022-10-23: 0.5 mg via RESPIRATORY_TRACT
  Filled 2022-10-23: qty 2.5

## 2022-10-23 MED ORDER — LORATADINE 10 MG PO TABS
10.0000 mg | ORAL_TABLET | Freq: Every day | ORAL | Status: DC
  Administered 2022-10-23 – 2022-10-27 (×5): 10 mg via ORAL
  Filled 2022-10-23 (×5): qty 1
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
## 2022-10-23 NOTE — ED Notes (Signed)
Pt came in to ED requesting pain meds, ice, and water. Pt wearing 2-3LNC which is baseline for her
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
## 2022-10-23 NOTE — Progress Notes (Signed)
Pt refused CT scan and bipap. On call cardiology paged via amion.

## 2022-10-23 NOTE — Progress Notes (Signed)
Critical Lab  Lactic Acid 3.2 Provider on call notified, [place], see new order placed.
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
## 2022-10-23 NOTE — Progress Notes (Signed)
BLE venous duplex was attempted, however patient refusing exam at this time.  Feels test is not needed due to previous negative study on 10/11/22 and no change in symptoms.  Dr. Jearld Fenton made aware.   10/23/2022 11:17 AM Chantel Teti RVT, RDMS

## 2022-10-23 NOTE — ED Notes (Signed)
Pt refused CT and [place]

## 2022-10-23 NOTE — Progress Notes (Signed)
TR[facility] night cross cover note:   I was notified by RN of updated lactic acid result of 3.2.  Suspect contribution from relative decline in generalized perfusion from decrease in oxygen delivery capacity resulting from presenting acute hypoxic respiratory failure in the setting of acutely decompensated heart failure.  Oxygen saturations currently in the low to mid 90s on 5 L nasal cannula, with systolic blood pressures in the 120s.  Heart rates in the 80s to 90s.  Additionally, she remains afebrile.Will continue existing management of acute hypoxic respiratory failure in setting of acutely decompensated heart failure, and I have ordered a repeat lactic acid level for the morning.       Newton Pigg, DO Hospitalist

## 2022-10-23 NOTE — H&P (Signed)
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 ur NEGATIVE NEGATIVE mg/dL   Protein, ur NEGATIVE NEGATIVE mg/dL   Nitrite NEGATIVE NEGATIVE   Leukocytes,Ua NEGATIVE NEGATIVE   RBC / HPF 0-5 0 - 5 RBC/hpf   WBC, UA 0-5 0 - 5 WBC/hpf   Bacteria, UA NONE SEEN NONE SEEN   Squamous Epithelial / HPF 0-5 0 - 5 /HPF   Mucus PRESENT    Hyaline Casts, UA PRESENT      Comment: Performed at [facility], [address] [place], [place] [address]   Assessment and Plan: No notes have been filed under this hospital service. Service: Hospitalist  Dyspena Chronic Respiratory Failure in the setting of Acute on Chronic Diastolic CHF -Admitted to Progressive -EDP wanted to rule out PE and evaluate a Chest CTA PE Protocol but patient refused -Patient has LE leg Swelling and EDP ordered a LE Duplex to r/o DVT given history of DVT and patient refused given that she had a LE Duplex in the ED on 10/11/22 and was Negative for DVT -ABG as below -Hold Inhalers and Start Xopenex/Atrovent, Budesonide and Brovana -Patient is not on Any Anticoagulation -Start Diuresis -BNP was 130 -Strict I's and O's and Daily Weights.  Intake/Output Summary (Last 24 hours) at 10/23/2022 2212 Last data filed at 10/23/2022 2129 Gross per 24 hour  Intake 240 ml  Output 900 ml  Net -660 ml  -Cardiology Consulted for further evaluation and recommendations -Continue to Monitor for S/Sx of  Somnolence and Drowsiness -Unclear Etiology but could be Narcotic Releated. There is documented Hx of being on Suboxone and followed at the [facility]; Review of PDMP she recently had Clonazepam 2 mg and Suboxone 4 mg -1 mg SL Film was filled on 7/10 and 7/8 -? Related to Narcotics; Hold Nartocic Medications at this Time and Benzodizaepines -Check TSH, RPR, B12, Ammonia Level -Try Ammonia Inhalant -ABG: ABG    Component Value Date/Time   PHART 7.376 10/23/2022 1711   PCO2ART 59.7 (H) 10/23/2022 1711   PO2ART 65 (L) 10/23/2022 1711   HCO3 35.0 (H) 10/23/2022 1711   TCO2 37 (H) 10/23/2022 1711   O2SAT 91 10/23/2022 1711  -Was going to be placed on BiPAP but woke up -Head CT was ordered and pending  Leukocytosis -WBC Trend: Recent Labs  Lab 10/02/22 1533 10/11/22 2100 10/23/22 0838  WBC 17.7* 15.6* 19.1*  -Check Blood Cx x2 -U/A Negative -SARS CoV-2 Negative -Continue  to Monitor for S/Sx of Infection -Repeat CBC in the AM  Hypothyroidism -C/w Levothyroxine -Check TSH in the AM  HLD -C/w Rosuvastatin 10 mg po at bedtime  Normocytic Anemia -Hgb/Hct Trend: Recent Labs  Lab 10/02/22 1533 10/11/22 2100 10/23/22 0838 10/23/22 1108 10/23/22 1434 10/23/22 1711  HGB 12.4 12.4 10.7* 12.2 11.2* 11.9*  HCT 39.6 39.3 34.6* 36.0 33.0* 35.0*  MCV 92.3 94.5 96.6  --   --   --   -Check Anemia Panel in the AM -C/w Ferrous Sulfate 325 mg po daily -Continue to Monitor for S/Sx of Bleeding; No overt bleeding noted -Repeat CBC in the AM   Hypokalemia -Patient's K+ Level Trend: Recent Labs  Lab 10/02/22 1533  10/11/22 2100 10/23/22 0838 10/23/22 1108 10/23/22 1434 10/23/22 1711  K 3.8 3.8 3.6 3.4* 3.3* 3.1*  -Replete with Potassium Chloride 40 mEQ BID -Continue to Monitor and Replete as Necessary -Repeat CMP in the AM   Severe COPD -C/w Supplemental O2 -See Nebs As Above -SpO2: 92 % O2 Flow Rate (L/min): 5 L/min -Repeat CXR in the AM and obtain CTA PE if patient allows  Morbid Obesity -Complicates overall prognosis and care -Estimated body mass index is 45.49 kg/m as calculated from the following:   Height as of this encounter: 5\' 5"  (1.651 m).   Weight as of this encounter: 124 kg.  -Weight Loss and Dietary Counseling given  Advance Care Planning:   Code Status: Full Code   Consults: Cardiology; Discussed with Pulmonary  Family Communication: No family present at bedside  Severity of Illness: The appropriate patient status for this patient is INPATIENT. Inpatient status is judged to be reasonable and necessary in order to provide the required intensity of service to ensure the patient's safety. The patient's presenting symptoms, physical exam findings, and initial radiographic and laboratory data in the context of their chronic comorbidities is felt to place them at high risk for further clinical deterioration. Furthermore, it is not  anticipated that the patient will be medically stable for discharge from the hospital within 2 midnights of admission.   * I certify that at the point of admission it is my clinical judgment that the patient will require inpatient hospital care spanning beyond 2 midnights from the point of admission due to high intensity of service, high risk for further deterioration and high frequency of surveillance required.*  Author: Marguerita Merles, DO [facility] 10/23/2022 9:40 PM  For on call review [email or web address].

## 2022-10-23 NOTE — Progress Notes (Signed)
RT obtained ABG at this time. RT informed patient that BIPAP had been ordered. Patient refused to wear BIPAP. RT explained importance of BIPAP therapy and patient continued to refuse. RT informed pt that if she changes her mind that BIPAP is available. RT notified RN. RT will monitor as needed.
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
## 2022-10-24 ENCOUNTER — Encounter (HOSPITAL_COMMUNITY): Payer: Self-pay | Admitting: Internal Medicine

## 2022-10-24 ENCOUNTER — Encounter (HOSPITAL_COMMUNITY): Payer: Medicaid Other

## 2022-10-24 ENCOUNTER — Inpatient Hospital Stay (HOSPITAL_COMMUNITY): Payer: Medicaid Other

## 2022-10-24 DIAGNOSIS — J9601 Acute respiratory failure with hypoxia: Secondary | ICD-10-CM | POA: Diagnosis not present

## 2022-10-24 DIAGNOSIS — I5033 Acute on chronic diastolic (congestive) heart failure: Secondary | ICD-10-CM

## 2022-10-24 DIAGNOSIS — R0600 Dyspnea, unspecified: Secondary | ICD-10-CM | POA: Diagnosis not present

## 2022-10-24 DIAGNOSIS — J9602 Acute respiratory failure with hypercapnia: Secondary | ICD-10-CM | POA: Diagnosis not present

## 2022-10-24 LAB — COMPREHENSIVE METABOLIC PANEL
ALT: 14 U/L (ref 0–44)
AST: 18 U/L (ref 15–41)
Albumin: 3.4 g/dL — ABNORMAL LOW (ref 3.5–5.0)
Alkaline Phosphatase: 66 U/L (ref 38–126)
Anion gap: 11 (ref 5–15)
BUN: 6 mg/dL — ABNORMAL LOW (ref 8–23)
CO2: 30 mmol/L (ref 22–32)
Calcium: 8 mg/dL — ABNORMAL LOW (ref 8.9–10.3)
Chloride: 95 mmol/L — ABNORMAL LOW (ref 98–111)
Creatinine, Ser: 0.77 mg/dL (ref 0.44–1.00)
GFR, Estimated: >60 mL/min (ref >60)
Glucose, Bld: 124 mg/dL — ABNORMAL HIGH (ref 70–99)
Potassium: 3.5 mmol/L (ref 3.5–5.1)
Sodium: 136 mmol/L (ref 135–145)
Total Bilirubin: 0.7 mg/dL (ref 0.3–1.2)
Total Protein: 7.1 g/dL (ref 6.5–8.1)

## 2022-10-24 LAB — CBC WITH DIFFERENTIAL/PLATELET
Abs Immature Granulocytes: 0.15 10*3/uL — ABNORMAL HIGH (ref 0.00–0.07)
Basophils Absolute: 0.1 10*3/uL (ref 0.0–0.1)
Basophils Relative: 0 %
Eosinophils Absolute: 0.2 10*3/uL (ref 0.0–0.5)
Eosinophils Relative: 1 %
HCT: 34.8 % — ABNORMAL LOW (ref 36.0–46.0)
Hemoglobin: 10.8 g/dL — ABNORMAL LOW (ref 12.0–15.0)
Immature Granulocytes: 1 %
Lymphocytes Relative: 10 %
Lymphs Abs: 1.8 10*3/uL (ref 0.7–4.0)
MCH: 29.9 pg (ref 26.0–34.0)
MCHC: 31 g/dL (ref 30.0–36.0)
MCV: 96.4 fL (ref 80.0–100.0)
Monocytes Absolute: 1.1 10*3/uL — ABNORMAL HIGH (ref 0.1–1.0)
Monocytes Relative: 6 %
Neutro Abs: 15.2 10*3/uL — ABNORMAL HIGH (ref 1.7–7.7)
Neutrophils Relative %: 82 %
Platelets: 248 10*3/uL (ref 150–400)
RBC: 3.61 MIL/uL — ABNORMAL LOW (ref 3.87–5.11)
RDW: 14.6 % (ref 11.5–15.5)
WBC: 18.6 10*3/uL — ABNORMAL HIGH (ref 4.0–10.5)
nRBC: 0.1 % (ref 0.0–0.2)

## 2022-10-24 LAB — IRON AND TIBC
Iron: 15 ug/dL — ABNORMAL LOW (ref 28–170)
Saturation Ratios: 5 % — ABNORMAL LOW (ref 10.4–31.8)
TIBC: 319 ug/dL (ref 250–450)
UIBC: 304 ug/dL

## 2022-10-24 LAB — RETICULOCYTES
Immature Retic Fract: 16.2 % — ABNORMAL HIGH (ref 2.3–15.9)
RBC.: 3.83 MIL/uL — ABNORMAL LOW (ref 3.87–5.11)
Retic Count, Absolute: 160.1 10*3/uL (ref 19.0–186.0)
Retic Ct Pct: 4.2 % — ABNORMAL HIGH (ref 0.4–3.1)

## 2022-10-24 LAB — LACTIC ACID, PLASMA: Lactic Acid, Venous: 1.9 mmol/L (ref 0.5–1.9)

## 2022-10-24 LAB — HEMOGLOBIN A1C
Hgb A1c MFr Bld: 6.5 % — ABNORMAL HIGH (ref 4.8–5.6)
Mean Plasma Glucose: 139.85 mg/dL

## 2022-10-24 LAB — RPR: RPR Ser Ql: NONREACTIVE

## 2022-10-24 LAB — BRAIN NATRIURETIC PEPTIDE: B Natriuretic Peptide: 38.8 pg/mL (ref 0.0–100.0)

## 2022-10-24 LAB — TSH: TSH: 2.301 u[IU]/mL (ref 0.350–4.500)

## 2022-10-24 LAB — PHOSPHORUS: Phosphorus: 2.9 mg/dL (ref 2.5–4.6)

## 2022-10-24 LAB — FOLATE: Folate: 19.1 ng/mL (ref >5.9)

## 2022-10-24 LAB — FERRITIN: Ferritin: 222 ng/mL (ref 11–307)

## 2022-10-24 LAB — VITAMIN B12: Vitamin B-12: 502 pg/mL (ref 180–914)

## 2022-10-24 LAB — GLUCOSE, CAPILLARY: Glucose-Capillary: 111 mg/dL — ABNORMAL HIGH (ref 70–99)

## 2022-10-24 LAB — MAGNESIUM: Magnesium: 1.9 mg/dL (ref 1.7–2.4)

## 2022-10-24 MED ORDER — IPRATROPIUM BROMIDE 0.02 % IN SOLN
0.5000 mg | Freq: Three times a day (TID) | RESPIRATORY_TRACT | Status: DC
  Administered 2022-10-24: 0.5 mg via RESPIRATORY_TRACT
  Filled 2022-10-24: qty 2.5

## 2022-10-24 MED ORDER — POTASSIUM CHLORIDE CRYS ER 20 MEQ PO TBCR: 40.0000 meq | EXTENDED_RELEASE_TABLET | Freq: Two times a day (BID) | ORAL | Status: DC

## 2022-10-24 MED ORDER — LEVALBUTEROL HCL 0.63 MG/3ML IN NEBU: 0.6300 mg | INHALATION_SOLUTION | Freq: Four times a day (QID) | RESPIRATORY_TRACT | Status: DC | PRN

## 2022-10-24 MED ORDER — SODIUM CHLORIDE 0.9 % IV SOLN
1.0000 g | INTRAVENOUS | Status: DC
  Administered 2022-10-24 – 2022-10-27 (×4): 1 g via INTRAVENOUS
  Filled 2022-10-24 (×4): qty 10

## 2022-10-24 MED ORDER — LEVALBUTEROL HCL 0.63 MG/3ML IN NEBU
0.6300 mg | INHALATION_SOLUTION | Freq: Three times a day (TID) | RESPIRATORY_TRACT | Status: DC
  Administered 2022-10-24: 0.63 mg via RESPIRATORY_TRACT
  Filled 2022-10-24: qty 3

## 2022-10-24 MED ORDER — IOHEXOL 350 MG/ML SOLN
75.0000 mL | Freq: Once | INTRAVENOUS | Status: AC | PRN
  Administered 2022-10-24: 75 mL via INTRAVENOUS

## 2022-10-24 MED ORDER — AZITHROMYCIN 250 MG PO TABS
500.0000 mg | ORAL_TABLET | Freq: Every evening | ORAL | Status: DC
  Administered 2022-10-24 – 2022-10-27 (×4): 500 mg via ORAL
  Filled 2022-10-24 (×4): qty 2

## 2022-10-24 MED ORDER — POTASSIUM CHLORIDE CRYS ER 20 MEQ PO TBCR
40.0000 meq | EXTENDED_RELEASE_TABLET | Freq: Two times a day (BID) | ORAL | Status: AC
  Administered 2022-10-24 (×2): 40 meq via ORAL
  Filled 2022-10-24 (×2): qty 2
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
## 2022-10-24 NOTE — Progress Notes (Signed)
   10/24/22 2020  BiPAP/CPAP/SIPAP  BiPAP/CPAP/SIPAP Pt Type Adult  Reason BIPAP/CPAP not in use Non-compliant;Other(comment) (Patient states she passed sleep study 2x and passed)

## 2022-10-24 NOTE — Plan of Care (Signed)
  Problem: Education: Goal: Ability to demonstrate management of disease process will improve Outcome: Progressing Goal: Ability to verbalize understanding of medication therapies will improve Outcome: Progressing   Problem: Activity: Goal: Capacity to carry out activities will improve Outcome: Progressing   Problem: Cardiac: Goal: Ability to achieve and maintain adequate cardiopulmonary perfusion will improve Outcome: Progressing   Problem: Education: Goal: Knowledge of General Education information will improve Description: Including pain rating scale, medication(s)/side effects and non-pharmacologic comfort measures Outcome: Progressing   Problem: Clinical Measurements: Goal: Ability to maintain clinical measurements within normal limits will improve Outcome: Progressing Goal: Will remain free from infection Outcome: Progressing   Problem: Activity: Goal: Risk for activity intolerance will decrease Outcome: Progressing   Problem: Elimination: Goal: Will not experience complications related to bowel motility Outcome: Progressing Goal: Will not experience complications related to urinary retention Outcome: Progressing   Problem: Pain Managment: Goal: General experience of comfort will improve Outcome: Progressing

## 2022-10-24 NOTE — TOC Initial Note (Signed)
Transition of Care [facility]) - Initial/Assessment Note    Patient Details  Name: Michele Owens MRN: [id number] Date of Birth: May 27, 1960  [facility]) CM/SW Contact:    Leone Haven, RN Phone Number: 10/24/2022, 6:25 PM  Clinical Narrative:                 From home alone , she states she lives in section 8 housing, she has no PCP at this time, she was going to [facility] but she does not want to go back there any  longer.  She has insurance on file, will make her an follow up apt with a [facility].  She states she has an aide from 1 pm to 3:45 pm Mon thru Friday.  She has a w/chair, 3 n1 , shower chair, home oxygen 2 liters with [employer].  She states she will not have any one who can take her home , she will need transportation ast, she states she can not ride the bus  , she has no support system, she gets her medications from [facility].  Pta she is self ambulatory.   Expected Discharge Plan: Home/Self Care Barriers to Discharge: Continued Medical Work up   Patient Goals and CMS Choice Patient states their goals for this hospitalization and ongoing recovery are:: return home   Choice offered to / list presented to : NA      Expected Discharge Plan and Services In-house Referral: NA Discharge Planning Services: CM Consult Post Acute Care Choice: NA Living arrangements for the past 2 months: Single Family Home                 DME Arranged: N/A DME Agency: NA       HH Arranged: NA          Prior Living Arrangements/Services Living arrangements for the past 2 months: Single Family Home   Patient language and need for interpreter reviewed:: Yes Do you feel safe going back to the place where you live?: Yes      Need for Family Participation in Patient Care: No (Comment) Care giver support system in place?: No (comment) Current home services: DME (w/chair, 3 n1 , shower chair, home oxygen 2 liters with [employer]) Criminal Activity/Legal  Involvement Pertinent to Current Situation/Hospitalization: No - Comment as needed  Activities of Daily Living Home Assistive Devices/Equipment: Cane (specify quad or straight) ADL Screening (condition at time of admission) Patient's cognitive ability adequate to safely complete daily activities?: Yes Is the patient deaf or have difficulty hearing?: No Does the patient have difficulty seeing, even when wearing glasses/contacts?: No Does the patient have difficulty concentrating, remembering, or making decisions?: No Patient able to express need for assistance with ADLs?: Yes Does the patient have difficulty dressing or bathing?: Yes Independently performs ADLs?: No Communication: Independent Dressing (OT): Needs assistance Is this a change from baseline?: Pre-admission baseline Grooming: Needs assistance Is this a change from baseline?: Pre-admission baseline Feeding: Independent Bathing: Needs assistance Is this a change from baseline?: Pre-admission baseline Toileting: Independent In/Out Bed: Independent Walks in Home: Independent Does the patient have difficulty walking or climbing stairs?: Yes Weakness of Legs: Both Weakness of Arms/Hands: None  Permission Sought/Granted Permission sought to share information with : Case Manager Permission granted to share information with : Yes, Verbal Permission Granted              Emotional Assessment Appearance:: Appears stated age Attitude/Demeanor/Rapport: Guarded Affect (typically observed): Appropriate Orientation: : Oriented to  Self, Oriented to Place, Oriented to  Time, Oriented to Situation Alcohol / Substance Use: Not Applicable Psych Involvement: No (comment)  Admission diagnosis:  Acute on chronic diastolic CHF (congestive heart failure) (HCC) [I50.33] Patient Active Problem List   Diagnosis Date Noted   Acute on chronic diastolic CHF (congestive heart failure) (HCC) 10/23/2022   Current mild episode of major  depressive disorder without prior episode (HCC) 06/06/2022   Fever 06/04/2022   Acute on chronic diastolic (congestive) heart failure (HCC) 05/16/2022   Acute renal failure (ARF) (HCC) 02/01/2022   Hypothyroidism 02/01/2022   Chronic pain disorder 02/01/2022   Respiratory failure with hypercapnia (HCC) 12/23/2020   Anemia 12/23/2020   Diabetic peripheral neuropathy (HCC) 08/18/2020   Acute on chronic respiratory failure with hypoxia (HCC) 04/18/2020   Hemoptysis 04/18/2020   Acute encephalopathy 04/18/2020   Chronic diastolic CHF (congestive heart failure) (HCC) 04/18/2020   Sepsis (HCC) 04/18/2020   Obesity, Class III, BMI 40-49.9 (morbid obesity) (HCC)    Fall at home, initial encounter 06/23/2018   HNP (herniated nucleus pulposus), thoracic 06/23/2018   CHF (congestive heart failure) (HCC) 06/18/2018   Essential hypertension 08/28/2017   Obesity 08/28/2017   COPD exacerbation (HCC) 08/04/2017   Acute and chronic respiratory failure with hypercapnia (HCC)    Pulmonary HTN (HCC)    Genetic testing 02/27/2017   Family history of thyroid cancer    Hypomagnesemia 10/03/2016   Atypical chest pain    HLD (hyperlipidemia) 09/23/2016   COPD (chronic obstructive pulmonary disease) (HCC) 09/23/2016   Gout 09/23/2016   DVT (deep vein thrombosis) in pregnancy 09/23/2016   Thyroid cancer (HCC) 09/23/2016   Hypocalcemia 09/23/2016   AKI (acute kidney injury) (HCC) 09/23/2016   Acute pulmonary edema (HCC)    Acute hypoxemic respiratory failure (HCC)    Chronic respiratory failure with hypoxia and hypercapnia (HCC) 07/16/2016   CAP (community acquired pneumonia) 01/23/2016   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 01/23/2016   Pulmonary hypertension (HCC) 01/23/2016   Obesity hypoventilation syndrome (HCC) 08/26/2015   Dyslipidemia associated with type 2 diabetes mellitus (HCC) 04/24/2015   Controlled type 2 diabetes mellitus without complication, without long-term current  use of insulin (HCC) 04/24/2015   Depression with anxiety 04/24/2015   Benign essential HTN 04/24/2015   Normocytic anemia 04/24/2015   Anxiety 04/24/2015   OSA (obstructive sleep apnea) 05/10/2012   Tobacco abuse 07/04/2011   PCP:  Oneita Hurt, No Pharmacy:   [facility]- [place], [place] - [address] [place] [address] Phone: [phone] Fax: [phone]  [facility] - [place], [place] - [address] [place] [address] Phone: [phone] Fax: [phone]  [facility] [address] [place] [address] Phone: [phone] Fax: [phone]     Social Determinants of Health (SDOH) Social History: SDOH Screenings   Food Insecurity: No Food Insecurity (10/23/2022)  Housing: Low Risk  (10/24/2022)  Transportation Needs: No Transportation Needs (10/23/2022)  Utilities: Not At Risk (10/23/2022)  Alcohol Screen: Low Risk  (10/24/2022)  Depression (PHQ2-9): Low Risk  (03/21/2021)  Financial Resource Strain: Low Risk  (10/24/2022)  Physical Activity: Inactive (02/01/2021)  Social Connections: Unknown (08/21/2021)   Received from [facility]  Stress: Stress Concern Present (02/23/2021)  Tobacco Use: Medium Risk (10/23/2022)   SDOH Interventions: Housing Interventions: Intervention Not Indicated Alcohol Usage Interventions: Intervention Not Indicated (Score <7) Financial Strain Interventions: Intervention Not Indicated   Readmission  Risk Interventions    07/09/2022   10:29 AM 05/18/2022   11:29 AM  Readmission Risk Prevention Plan  Transportation Screening Complete Complete  PCP or Specialist Appt within 3-5 Days Complete   HRI or Home Care Consult Complete Complete  Social Work Consult for Recovery Care Planning/Counseling Complete Complete  Palliative Care Screening Not Applicable Complete  Medication Review Oceanographer) Not Complete Referral to  Pharmacy

## 2022-10-24 NOTE — TOC Progression Note (Signed)
Transition of Care [facility]) - Progression Note    Patient Details  Name: Michele Owens MRN: [id number] Date of Birth: 1960-06-14  Transition of Care [facility]) CM/SW Contact  Reva Bores, LCSWA Phone Number: 10/24/2022, 2:18 PM  Clinical Narrative:  CSW met with pt at bedside. Pt stated that she was having difficulty understanding why her cards were not working.  Pt kept referring to her Eat Well card as her [facility] card. CSW explained that there was a difference in the two cards and that she needed to contact her case manager at DSS to assist and explain what she needs help with regarding her FS and Eat Well card. Pt appeared to be unpleased and fussed throughout the duration of the time the CSW was in the room. CSW called DSS from pt's phone and placed it on speaker phone so the pt could speak directly with case manager. CSW encouraged the pt to stay on the line so she could get her questions answered pertaining to her cards.          Expected Discharge Plan and Services                                               Social Determinants of Health (SDOH) Interventions SDOH Screenings   Food Insecurity: No Food Insecurity (10/23/2022)  Housing: Low Risk  (10/24/2022)  Transportation Needs: No Transportation Needs (10/23/2022)  Utilities: Not At Risk (10/23/2022)  Alcohol Screen: Low Risk  (10/24/2022)  Depression (PHQ2-9): Low Risk  (03/21/2021)  Financial Resource Strain: Low Risk  (10/24/2022)  Physical Activity: Inactive (02/01/2021)  Social Connections: Unknown (08/21/2021)   Received from [facility]  Stress: Stress Concern Present (02/23/2021)  Tobacco Use: Medium Risk (10/23/2022)    Readmission Risk Interventions    07/09/2022   10:29 AM 05/18/2022   11:29 AM  Readmission Risk Prevention Plan  Transportation Screening Complete Complete  PCP or Specialist Appt within 3-5 Days Complete   HRI or Home Care Consult Complete Complete  Social Work Consult for Recovery  Care Planning/Counseling Complete Complete  Palliative Care Screening Not Applicable Complete  Medication Review Oceanographer) Not Complete Referral to Pharmacy

## 2022-10-24 NOTE — Progress Notes (Signed)
   Heart Failure Stewardship Pharmacist Progress Note   PCP: Pcp, No PCP-Cardiologist: Marca Ancona, MD    HPI:  62 yo F with PMH of CHF, HTN, SVT, pulmonary HTN, OSA, COPD, HLD, T2DM, morbid obesity, DVT in pregnancy, and tobacco use.   She was admitted in 8/16 with diastolic CHF and COPD exacerbation.  She was admitted in 1/17 with COPD exacerbation.  She was set up for a Cardiolite in 2/17.  This showed a small, moderate intensity partially reversible inferolateral defect.  LHC/RHC in 3/17 showed no significant CAD, mildly elevated PCWP.  PFTs in 3/17 showed severe COPD.     Admitted 10/17 with AECOPD and A/C diastolic CHF.  Diuresed with IV lasix. Completed course for CAP. Pt also noted have multinodular goiter on [place].  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    Echo in 4/18 showed EF 45-50%, moderate dilated RV with moderately decreased systolic function.    She was admitted again in 4/18 with COPD exacerbation, acute on chronic diastolic CHF, and CAP.     She was admitted in 3/20 with acute on chronic diastolic CHF and treated with IV Lasix.  Echo showed EF 60-65%, mild RV enlargement with normal function, respirophasic septal variation.    Repeat 2D echo in 5/21 showed normal LVEF 55-60% (lower than prior study, 60-65%). RV normal. RVSP was normal.   She was last seen by AHF clinic in 09/2019. Multiple ED visits and admissions for HF since with no show to clinics.   Last admitted twice in 2/24 with A/C diastolic CHF. ECHO 05/15/22 with LVEF 55-60%, mild concentric LVH, no RWMA, RV normal. In the ED in 6/24 and 7/24 with edema.   Presented to the ED on 10/23/22 with chest pain, shortness of breath, and LE edema. States she ran out of medications a week ago. CXR with pulmonary vascular congestion and cardiomegaly. CTA negative for PE or dissection.   Current HF Medications: Diuretic: furosemide 60 mg IV BID ACE/ARB/ARNI: losartan 25 mg daily  Prior to  admission HF Medications: Diuretic: torsemide 20 mg BID Beta blocker: carvedilol 3.125 mg BID ACE/ARB/ARNI: lisinopril 5 mg daily  Pertinent Lab Values: Serum creatinine 0.77, BUN 6, Potassium 3.5, Sodium 136, BNP 38.8, Magnesium 1.9, A1c 6.5   Vital Signs: Weight: 273 lbs (admission weight: 279 lbs) Blood pressure: 110/60s  Heart rate: 90s  I/O: net -0.7L yesterday; net -1.3L since admission  Medication Assistance / Insurance Benefits Check: Does the patient have prescription insurance?  Yes Type of insurance plan: [place] [employer]  Outpatient Pharmacy:  Prior to admission outpatient pharmacy: [facility] Is the patient willing to use [facility] TOC pharmacy at discharge? Yes Is the patient willing to transition their outpatient pharmacy to utilize a [facility] outpatient pharmacy?   Pending    Assessment: 1. Acute on chronic diastolic CHF (LVEF 55-60%), due to NICM. NYHA class III symptoms. - Continue furosemide 60 mg IV BID. Strict I/Os and daily weights. Keep K>4 and Mg>2. - Holding carvedilol with severe COPD - Agree with transitioning to losartan 25 mg daily - Consider adding MRA tomorrow if creatinine stable - Caution SGLT2i with body habitus  Plan: 1) Medication changes recommended at this time: - Add spironolactone 12.5 mg daily tomorrow   2) Patient assistance: - None pending  3)  Education  - To be completed before discharge   Sharen Hones, PharmD, BCPS Heart Failure Engineer, building services Phone [phone]
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
## 2022-10-24 NOTE — Progress Notes (Signed)
Rounding Note    Patient Name: Michele Owens Date of Encounter: 10/24/2022  [facility] Cardiologist: Marca Ancona, MD 2021  Subjective   Breathing is some better  No CP   Inpatient Medications    Scheduled Meds:  ammonia  1 each Inhalation Once   arformoterol  15 mcg Nebulization BID   aspirin EC  81 mg Oral Daily   budesonide (PULMICORT) nebulizer solution  0.25 mg Nebulization BID   busPIRone  5 mg Oral TID   enoxaparin (LOVENOX) injection  60 mg Subcutaneous Q24H   ferrous sulfate  325 mg Oral Q breakfast   furosemide  60 mg Intravenous Q12H   guaiFENesin  600 mg Oral BID   ipratropium  0.5 mg Nebulization TID   levalbuterol  0.63 mg Nebulization TID   levothyroxine  224 mcg Oral QAC breakfast   loratadine  10 mg Oral QHS   losartan  25 mg Oral Daily   naLOXone (NARCAN)  injection  0.4 mg Intravenous Once   pantoprazole  40 mg Oral Daily   potassium chloride  40 mEq Oral BID   rifaximin  550 mg Oral TID   rosuvastatin  10 mg Oral QPM   senna-docusate  1 tablet Oral Daily   valACYclovir  1,000 mg Oral Daily   Continuous Infusions:  PRN Meds: acetaminophen **OR** acetaminophen, albuterol, hydrALAZINE, loratadine, nitroGLYCERIN, ondansetron **OR** ondansetron (ZOFRAN) IV, polyethylene glycol   Vital Signs    Vitals:   10/24/22 0104 10/24/22 0252 10/24/22 0423 10/24/22 0710  BP: (!) 108/59  119/66 (!) 107/55  Pulse: 88  77   Resp: (!) 21  20 20   Temp: 99.2 F (37.3 C)  98.9 F (37.2 C) 99.2 F (37.3 C)  TempSrc: Oral  Oral Oral  SpO2: 92%  91% 91%  Weight:  126.9 kg    Height:        Intake/Output Summary (Last 24 hours) at 10/24/2022 0929 Last data filed at 10/24/2022 0253 Gross per 24 hour  Intake 240 ml  Output 1800 ml  Net -1560 ml      10/24/2022    2:52 AM 10/23/2022    8:18 AM 10/02/2022    3:27 PM  Last 3 Weights  Weight (lbs) 279 lb 12.8 oz 273 lb 5.9 oz   Weight (kg) 126.916 kg 124 kg      Information is confidential and  restricted. Go to Review Flowsheets to unlock data.      Telemetry    SR  - Personally Reviewed  ECG     - Personally Reviewed  Physical Exam   GEN: Morbidly obese   In no  acute distress.   Neck: Neck is full  Cardiac: RRR, no murmurs, Respiratory:Decreased airflow   GI: Soft, nontender, non-distended  MS: Tr edema LE  No deformity. Neuro:  Nonfocal  Psych: Normal affect   Labs    High Sensitivity Troponin:   Recent Labs  Lab 10/02/22 1533 10/02/22 2138 10/23/22 0838 10/23/22 1038  TROPONINIHS 7 7 35* 35*     Chemistry Recent Labs  Lab 10/23/22 0838 10/23/22 1108 10/23/22 1434 10/23/22 1711 10/24/22 0623  NA 138   < > 138 138 136  K 3.6   < > 3.3* 3.1* 3.5  CL 98  --   --   --  95*  CO2 31  --   --   --  30  GLUCOSE 123*  --   --   --  124*  BUN 5*  --   --   --  6*  CREATININE 0.85  --   --   --  0.77  CALCIUM 8.5*  --   --   --  8.0*  MG  --   --   --   --  1.9  PROT  --   --   --   --  7.1  ALBUMIN  --   --   --   --  3.4*  AST  --   --   --   --  18  ALT  --   --   --   --  14  ALKPHOS  --   --   --   --  66  BILITOT  --   --   --   --  0.7  GFRNONAA >60  --   --   --  >60  ANIONGAP 9  --   --   --  11   < > = values in this interval not displayed.    Lipids No results for input(s): "CHOL", "TRIG", "HDL", "LABVLDL", "LDLCALC", "CHOLHDL" in the last 168 hours.  Hematology Recent Labs  Lab 10/23/22 0838 10/23/22 1108 10/23/22 1434 10/23/22 1711 10/24/22 0623  WBC 19.1*  --   --   --  18.6*  RBC 3.58*  --   --   --  3.61*  3.83*  HGB 10.7*   < > 11.2* 11.9* 10.8*  HCT 34.6*   < > 33.0* 35.0* 34.8*  MCV 96.6  --   --   --  96.4  MCH 29.9  --   --   --  29.9  MCHC 30.9  --   --   --  31.0  RDW 14.5  --   --   --  14.6  PLT 268  --   --   --  248   < > = values in this interval not displayed.   Thyroid  Recent Labs  Lab 10/24/22 0623  TSH 2.301    BNP Recent Labs  Lab 10/23/22 0838 10/24/22 0623  BNP 130.6* 38.8    DDimer No  results for input(s): "DDIMER" in the last 168 hours.   Radiology    X-ray chest PA and lateral  Result Date: 10/24/2022 CLINICAL DATA:  Dyspnea. EXAM: CHEST - 2 VIEW COMPARISON:  October 23, 2022. FINDINGS: Stable cardiomegaly. Minimal bibasilar subsegmental atelectasis is noted with possible small pleural effusions. Bony thorax is unremarkable. IMPRESSION: Minimal bibasilar subsegmental atelectasis is noted with possible small pleural effusions. Electronically Signed   By: Lupita Raider M.D.   On: 10/24/2022 08:44   DG Chest Port 1 View  Result Date: 10/23/2022 CLINICAL DATA:  Chest pain EXAM: PORTABLE CHEST 1 VIEW COMPARISON:  10/11/2022 FINDINGS: Cardiomegaly. Pulmonary vascular prominence and diffuse bilateral interstitial pulmonary opacity. No focal airspace opacity. Osseous structures unremarkable. IMPRESSION: Cardiomegaly with pulmonary vascular prominence and diffuse bilateral interstitial pulmonary opacity, most consistent with edema. No focal airspace opacity. Electronically Signed   By: Jearld Lesch M.D.   On: 10/23/2022 09:12    Cardiac Studies    Echo  Feb 2024   1. Left ventricular ejection fraction, by estimation, is 55 to 60%. The  left ventricle has normal function. The left ventricle has no regional  wall motion abnormalities. There is mild concentric left ventricular  hypertrophy. Left ventricular diastolic  parameters are consistent with Grade I diastolic dysfunction (impaired  relaxation).  2. Very mildly D-shaped interventricular septum suggestive of RV  pressure/volume overload. Right ventricular systolic function is normal.  The right ventricular size is normal.   3. Right atrial size was mildly dilated.   4. The mitral valve is normal in structure. Trivial mitral valve  regurgitation. No evidence of mitral stenosis.   5. The aortic valve is tricuspid. Aortic valve regurgitation is not  visualized. No aortic stenosis is present.   6. IVC not visualized. Peak  RV-RA gradient 17 mmHg.   7. Technically difficult study with poor acoustic windows.     Patient Profile     Michele Owens is a 62 y.o. female with a hx of  HFpEF who is being seen 10/23/2022 for the evaluation of SOB and edema at the request of Dr Marland Mcalpine.   Assessment & Plan    # HFpEF   Acute on chronic    In setting of severe COPD   Pt also ran out of meds about 1 week ago Pt has diuresed some with IV lasix   (1.6 L)   I would continue  Renal function stable  Switched to ARB    WIth body habitus, limited mobilty, concern for adding Jardiance  # Severe COPD  and asthma   Continue O2 [place]  # HTN   BP controlled   #HL  Continue statin  For questions or updates, please contact [facility] Please consult [email or web address] for contact info under        Signed, Dietrich Pates, MD  10/24/2022, 9:29 AM

## 2022-10-24 NOTE — Plan of Care (Signed)

## 2022-10-25 ENCOUNTER — Inpatient Hospital Stay (HOSPITAL_COMMUNITY): Payer: Medicaid Other

## 2022-10-25 ENCOUNTER — Encounter (HOSPITAL_COMMUNITY): Payer: Medicaid Other

## 2022-10-25 DIAGNOSIS — I5033 Acute on chronic diastolic (congestive) heart failure: Secondary | ICD-10-CM | POA: Diagnosis not present

## 2022-10-25 DIAGNOSIS — J9601 Acute respiratory failure with hypoxia: Secondary | ICD-10-CM | POA: Diagnosis not present

## 2022-10-25 DIAGNOSIS — J9602 Acute respiratory failure with hypercapnia: Secondary | ICD-10-CM

## 2022-10-25 LAB — COMPREHENSIVE METABOLIC PANEL
ALT: 13 U/L (ref 0–44)
AST: 16 U/L (ref 15–41)
Albumin: 3.3 g/dL — ABNORMAL LOW (ref 3.5–5.0)
Alkaline Phosphatase: 64 U/L (ref 38–126)
Anion gap: 9 (ref 5–15)
BUN: 6 mg/dL — ABNORMAL LOW (ref 8–23)
CO2: 29 mmol/L (ref 22–32)
Calcium: 8.1 mg/dL — ABNORMAL LOW (ref 8.9–10.3)
Chloride: 98 mmol/L (ref 98–111)
Creatinine, Ser: 0.71 mg/dL (ref 0.44–1.00)
GFR, Estimated: >60 mL/min (ref >60)
Glucose, Bld: 114 mg/dL — ABNORMAL HIGH (ref 70–99)
Potassium: 4 mmol/L (ref 3.5–5.1)
Sodium: 136 mmol/L (ref 135–145)
Total Bilirubin: 0.7 mg/dL (ref 0.3–1.2)
Total Protein: 7.3 g/dL (ref 6.5–8.1)

## 2022-10-25 LAB — MAGNESIUM: Magnesium: 1.8 mg/dL (ref 1.7–2.4)

## 2022-10-25 LAB — CBC WITH DIFFERENTIAL/PLATELET
Abs Immature Granulocytes: 0.13 10*3/uL — ABNORMAL HIGH (ref 0.00–0.07)
Basophils Absolute: 0 10*3/uL (ref 0.0–0.1)
Basophils Relative: 0 %
Eosinophils Absolute: 0.3 10*3/uL (ref 0.0–0.5)
Eosinophils Relative: 2 %
HCT: 39 % (ref 36.0–46.0)
Hemoglobin: 12.2 g/dL (ref 12.0–15.0)
Immature Granulocytes: 1 %
Lymphocytes Relative: 9 %
Lymphs Abs: 1.2 10*3/uL (ref 0.7–4.0)
MCH: 29.7 pg (ref 26.0–34.0)
MCHC: 31.3 g/dL (ref 30.0–36.0)
MCV: 94.9 fL (ref 80.0–100.0)
Monocytes Absolute: 0.7 10*3/uL (ref 0.1–1.0)
Monocytes Relative: 5 %
Neutro Abs: 11.4 10*3/uL — ABNORMAL HIGH (ref 1.7–7.7)
Neutrophils Relative %: 83 %
Platelets: 259 10*3/uL (ref 150–400)
RBC: 4.11 MIL/uL (ref 3.87–5.11)
RDW: 14.4 % (ref 11.5–15.5)
WBC: 13.8 10*3/uL — ABNORMAL HIGH (ref 4.0–10.5)
nRBC: 0 % (ref 0.0–0.2)

## 2022-10-25 LAB — PROCALCITONIN: Procalcitonin: <0.1 ng/mL

## 2022-10-25 LAB — PHOSPHORUS: Phosphorus: 3.5 mg/dL (ref 2.5–4.6)

## 2022-10-25 LAB — GLUCOSE, CAPILLARY: Glucose-Capillary: 130 mg/dL — ABNORMAL HIGH (ref 70–99)

## 2022-10-25 MED ORDER — FLUTICASONE PROPIONATE 50 MCG/ACT NA SUSP
2.0000 | Freq: Every day | NASAL | Status: DC
  Administered 2022-10-25 – 2022-10-27 (×3): 2 via NASAL
  Filled 2022-10-25: qty 16

## 2022-10-25 MED ORDER — BUPRENORPHINE HCL-NALOXONE HCL 2-0.5 MG SL SUBL
2.0000 | SUBLINGUAL_TABLET | Freq: Three times a day (TID) | SUBLINGUAL | Status: DC
  Administered 2022-10-25 – 2022-10-28 (×9): 2 via SUBLINGUAL
  Filled 2022-10-25 (×9): qty 2

## 2022-10-25 MED ORDER — CLONAZEPAM 0.5 MG PO TABS
2.0000 mg | ORAL_TABLET | Freq: Three times a day (TID) | ORAL | Status: DC
  Administered 2022-10-25 – 2022-10-28 (×9): 2 mg via ORAL
  Filled 2022-10-25 (×9): qty 4

## 2022-10-25 MED ORDER — ALLOPURINOL 100 MG PO TABS
100.0000 mg | ORAL_TABLET | Freq: Every day | ORAL | Status: DC
  Administered 2022-10-25 – 2022-10-28 (×4): 100 mg via ORAL
  Filled 2022-10-25 (×4): qty 1

## 2022-10-25 MED ORDER — FUROSEMIDE 10 MG/ML IJ SOLN
80.0000 mg | Freq: Two times a day (BID) | INTRAMUSCULAR | Status: DC
  Administered 2022-10-25 – 2022-10-28 (×6): 80 mg via INTRAVENOUS
  Filled 2022-10-25 (×6): qty 8

## 2022-10-25 NOTE — Plan of Care (Signed)

## 2022-10-25 NOTE — Evaluation (Signed)
Occupational Therapy Evaluation and Discharge Patient Details Name: Michele Owens MRN: [id number] DOB: 10-16-1960 Today's Date: 10/25/2022   History of Present Illness Pt is a 62 year old woman who presented to [facility] ED with shortness of breath on 7/15 due to CHF exacerbation. Pt had run out of her medication. PMH: COPD on 2L O2, HTN, anxiety, arthrits, asthma, depression DM, gout, thyroid ca, morbid obesity, SVT, pulmonary HTN.   Clinical Impression   Pt is functioning at or near her baseline in ADLs and mobility with RW. No further OT needs. Pt verbose and tangential, highly focused on her living situation and her need for DSS assistance.      Recommendations for follow up therapy are one component of a multi-disciplinary discharge planning process, led by the attending physician.  Recommendations may be updated based on patient status, additional functional criteria and insurance authorization.   Assistance Recommended at Discharge PRN  Patient can return home with the following Assist for transportation;Two people to help with bathing/dressing/bathroom;Assistance with cooking/housework    Functional Status Assessment  Patient has not had a recent decline in their functional status  Equipment Recommendations  None recommended by OT    Recommendations for Other Services       Precautions / Restrictions Precautions Precautions: Fall Restrictions Weight Bearing Restrictions: No      Mobility Bed Mobility Overal bed mobility: Modified Independent                  Transfers Overall transfer level: Modified independent Equipment used: Rolling walker (2 wheels)                      Balance Overall balance assessment: Needs assistance   Sitting balance-Leahy Scale: Good       Standing balance-Leahy Scale: Poor Standing balance comment: reliant on RW for ambulation                           ADL either performed or assessed with clinical  judgement   ADL Overall ADL's : Needs assistance/impaired Eating/Feeding: Independent   Grooming: Min guard;Standing   Upper Body Bathing: Set up;Sitting   Lower Body Bathing: Sit to/from stand;Min guard   Upper Body Dressing : Set up;Sitting   Lower Body Dressing: Min guard;Sit to/from stand   Toilet Transfer: Modified Independent;Stand-pivot;Rolling walker (2 wheels)   Toileting- Clothing Manipulation and Hygiene: Modified independent;Sit to/from stand       Functional mobility during ADLs: Supervision/safety;Rolling walker (2 wheels)       Vision Baseline Vision/History: 1 Wears glasses Ability to See in Adequate Light: 0 Adequate Patient Visual Report: No change from baseline       Perception     Praxis      Pertinent Vitals/Pain Pain Assessment Pain Assessment: Faces Faces Pain Scale: Hurts even more Pain Location: L knee Pain Descriptors / Indicators: Aching Pain Intervention(s): Monitored during session, Repositioned     Hand Dominance Right   Extremity/Trunk Assessment Upper Extremity Assessment Upper Extremity Assessment: Overall WFL for tasks assessed   Lower Extremity Assessment Lower Extremity Assessment: Defer to PT evaluation   Cervical / Trunk Assessment Cervical / Trunk Assessment: Other exceptions Cervical / Trunk Exceptions: obesity   Communication Communication Communication: No difficulties   Cognition Arousal/Alertness: Awake/alert Behavior During Therapy: Agitated Overall Cognitive Status: No family/caregiver present to determine baseline cognitive functioning Area of Impairment: Problem solving  Problem Solving: Requires verbal cues, Decreased initiation, Difficulty sequencing General Comments: asking for help with her eatwell card, tangential, verbose     General Comments       Exercises     Shoulder Instructions      Home Living Family/patient expects to be discharged to::  Private residence Living Arrangements: Alone Available Help at Discharge: Personal care attendant Type of Home: Apartment Home Access: Level entry     Home Layout: One level     Bathroom Shower/Tub: Chief Strategy Officer: Standard     Home Equipment: Gilmer Mor - single Producer, television/film/video (2 wheels);Rollator (4 wheels);Toilet riser;Other (comment)   Additional Comments: reports M-F 1-4 aide and Sat if needed      Prior Functioning/Environment Prior Level of Function : Needs assist             Mobility Comments: using a rollator walker at home per pt ADLs Comments: aide for bathing and dressing, for home chores        OT Problem List:        OT Treatment/Interventions:      OT Goals(Current goals can be found in the care plan section)    OT Frequency:      Co-evaluation              AM-PAC OT "6 Clicks" Daily Activity     Outcome Measure Help from another person eating meals?: None Help from another person taking care of personal grooming?: None Help from another person toileting, which includes using toliet, bedpan, or urinal?: None Help from another person bathing (including washing, rinsing, drying)?: None Help from another person to put on and taking off regular upper body clothing?: None Help from another person to put on and taking off regular lower body clothing?: None 6 Click Score: 24   End of Session Equipment Utilized During Treatment: Rolling walker (2 wheels);Oxygen (4)  Activity Tolerance: Patient tolerated treatment well Patient left: in bed;with call bell/phone within reach  OT Visit Diagnosis: Other abnormalities of gait and mobility (R26.89)                Time: 1610-9604 OT Time Calculation (min): 29 min Charges:  OT General Charges $OT Visit: 1 Visit OT Evaluation $OT Eval Moderate Complexity: 1 Mod  Berna Spare, OTR/L Acute Rehabilitation Services Office: [phone]   Evern Bio 10/25/2022, 3:28 PM

## 2022-10-25 NOTE — TOC Progression Note (Signed)
Transition of Care [facility]) - Progression Note    Patient Details  Name: Michele Owens MRN: [id number] Date of Birth: Nov 25, 1960  Transition of Care [facility]) CM/SW Contact  Leander Rams, LCSW Phone Number: 10/25/2022, 4:05 PM  Clinical Narrative:    CSW was notified that pt would like to speak with Child psychotherapist. CSW met with pt in room. Pt disclosed that the issues she is having need to be handled by her DSS Child psychotherapist. Pt began to confide to CSW. CSW provided emotional support. Pt denied having any needs for hospital CSW.   TOC will remain available should any additional needs arise.    Expected Discharge Plan: Home/Self Care Barriers to Discharge: Continued Medical Work up  Expected Discharge Plan and Services In-house Referral: NA Discharge Planning Services: CM Consult Post Acute Care Choice: NA Living arrangements for the past 2 months: Single Family Home                 DME Arranged: N/A DME Agency: NA       HH Arranged: NA           Social Determinants of Health (SDOH) Interventions SDOH Screenings   Food Insecurity: No Food Insecurity (10/23/2022)  Housing: Low Risk  (10/24/2022)  Transportation Needs: No Transportation Needs (10/23/2022)  Utilities: Not At Risk (10/23/2022)  Alcohol Screen: Low Risk  (10/24/2022)  Depression (PHQ2-9): Low Risk  (03/21/2021)  Financial Resource Strain: Low Risk  (10/24/2022)  Physical Activity: Inactive (02/01/2021)  Social Connections: Unknown (08/21/2021)   Received from [facility]  Stress: Stress Concern Present (02/23/2021)  Tobacco Use: Medium Risk (10/23/2022)    Readmission Risk Interventions    07/09/2022   10:29 AM 05/18/2022   11:29 AM  Readmission Risk Prevention Plan  Transportation Screening Complete Complete  PCP or Specialist Appt within 3-5 Days Complete   HRI or Home Care Consult Complete Complete  Social Work Consult for Recovery Care Planning/Counseling Complete Complete  Palliative Care Screening Not  Applicable Complete  Medication Review Oceanographer) Not Complete Referral to Pharmacy   Oletta Lamas, MSW, [place], LCASA Transitions of Care  Clinical Social Worker I

## 2022-10-25 NOTE — Evaluation (Signed)
Physical Therapy Evaluation Patient Details Name: Dagny Fiorentino MRN: [id number] DOB: 21-Jul-1960 Today's Date: 10/25/2022  History of Present Illness  Pt is a 62 year old woman who presented to [facility] ED with shortness of breath on 7/15 due to CHF exacerbation. Pt had run out of her medication. PMH: COPD on 2L O2, HTN, anxiety, arthrits, asthma, depression DM, gout, thyroid ca, morbid obesity, SVT, pulmonary HTN.  Clinical Impression  Patient presents close to functional baseline.  She mobilizes with rollator at home and able to walk good distance in hallway with RW with S on 4L O2 with SpO2 WNL, though c/o R knee pain throughout.  She was focused on getting help from DSS for her multiple needs at home.  SW was notified.  Patient without further PT needs.  Will sign off.         Assistance Recommended at Discharge Set up Supervision/Assistance  If plan is discharge home, recommend the following:  Can travel by private vehicle  Help with stairs or ramp for entrance;Assist for transportation        Equipment Recommendations Rolling walker (2 wheels)  Recommendations for Other Services       Functional Status Assessment Patient has not had a recent decline in their functional status     Precautions / Restrictions Precautions Precautions: Fall Precaution Comments: O2 dependent Restrictions Weight Bearing Restrictions: No      Mobility  Bed Mobility Overal bed mobility: Modified Independent                  Transfers Overall transfer level: Modified independent Equipment used: Rolling walker (2 wheels)                    Ambulation/Gait Ambulation/Gait assistance: Modified independent (Device/Increase time) Gait Distance (Feet): 200 Feet Assistive device: Rolling walker (2 wheels) Gait Pattern/deviations: Step-through pattern, Decreased stride length, Trunk flexed       General Gait Details: on 4L O2 throughout; stopped to drop her phone off at desk to  charge  Stairs            Wheelchair Mobility     Tilt Bed    Modified Rankin (Stroke Patients Only)       Balance Overall balance assessment: Needs assistance   Sitting balance-Leahy Scale: Good       Standing balance-Leahy Scale: Fair Standing balance comment: walking from sink where she washed her hands to bed sans walker                             Pertinent Vitals/Pain Pain Assessment Pain Assessment: Faces Faces Pain Scale: Hurts even more Pain Location: L knee Pain Descriptors / Indicators: Aching Pain Intervention(s): Monitored during session, Repositioned    Home Living Family/patient expects to be discharged to:: Private residence Living Arrangements: Alone Available Help at Discharge: Personal care attendant Type of Home: Apartment Home Access: Level entry       Home Layout: One level Home Equipment: Cane - single Producer, television/film/video (2 wheels);Rollator (4 wheels);Toilet riser;Other (comment) Additional Comments: reports M-F 1-4 aide and Sat if needed    Prior Function Prior Level of Function : Needs assist             Mobility Comments: using a rollator walker at home per pt ADLs Comments: aide for bathing and dressing, for home chores     Hand Dominance   Dominant Hand: Right  Extremity/Trunk Assessment   Upper Extremity Assessment Upper Extremity Assessment: Defer to OT evaluation    Lower Extremity Assessment Lower Extremity Assessment: Overall WFL for tasks assessed;RLE deficits/detail RLE Deficits / Details: pain with mobility she reports is chronic for her, but moves it under her own power    Cervical / Trunk Assessment Cervical / Trunk Assessment: Other exceptions Cervical / Trunk Exceptions: obesity  Communication   Communication: No difficulties  Cognition Arousal/Alertness: Awake/alert Behavior During Therapy: Agitated Overall Cognitive Status: No family/caregiver present to  determine baseline cognitive functioning Area of Impairment: Problem solving                             Problem Solving: Requires verbal cues, Decreased initiation, Difficulty sequencing General Comments: asking for help with her eatwell card, tangential, verbose, frustrated by multiple issues        General Comments      Exercises     Assessment/Plan    PT Assessment Patient does not need any further PT services  PT Problem List         PT Treatment Interventions      PT Goals (Current goals can be found in the Care Plan section)  Acute Rehab PT Goals PT Goal Formulation: All assessment and education complete, DC therapy    Frequency       Co-evaluation PT/OT/SLP Co-Evaluation/Treatment: Yes Reason for Co-Treatment: Other (comment) (cooperation) PT goals addressed during session: Mobility/safety with mobility         AM-PAC PT "6 Clicks" Mobility  Outcome Measure Help needed turning from your back to your side while in a flat bed without using bedrails?: None Help needed moving from lying on your back to sitting on the side of a flat bed without using bedrails?: None Help needed moving to and from a bed to a chair (including a wheelchair)?: None Help needed standing up from a chair using your arms (e.g., wheelchair or bedside chair)?: None Help needed to walk in hospital room?: None Help needed climbing 3-5 steps with a railing? : Total 6 Click Score: 21    End of Session Equipment Utilized During Treatment: Oxygen Activity Tolerance: Patient tolerated treatment well Patient left: in bed;with call bell/phone within reach   PT Visit Diagnosis: Difficulty in walking, not elsewhere classified (R26.2)    Time: 2952-8413 PT Time Calculation (min) (ACUTE ONLY): 29 min   Charges:   PT Evaluation $PT Eval Moderate Complexity: 1 Mod   PT General Charges $$ ACUTE PT VISIT: 1 Visit         Sheran Lawless, PT Acute Rehabilitation  Services Office:[phone] 10/25/2022   Elray Mcgregor 10/25/2022, 5:20 PM

## 2022-10-25 NOTE — Progress Notes (Signed)
PROGRESS NOTE  Malia Corsi WUX:[id number] DOB: 15-Mar-1961 DOA: 10/23/2022 PCP: Pcp, No  Brief History:     Assessment/Plan:  Dyspena and Chronic Respiratory Failure in the setting of Acute on Chronic Diastolic CHF with ? Concomitant PNA  -Chest CTA PE Protocol and showed "There is no evidence of central pulmonary artery embolism. Evaluation of small peripheral pulmonary artery branches is limited by motion artifacts and infiltrates. There is no evidence of thoracic aortic dissection. Small scattered coronary artery calcifications are seen. There are patchy infiltrates in both lower lung fields suggesting atelectasis/pneumonia. There are patchy ground-glass densities in the parahilar regions suggesting scarring or pulmonary edema." -Patient has LE leg Swelling and EDP ordered a LE Duplex to r/o DVT given history of DVT and patient refused given that she had a LE Duplex in the ED on 10/11/22 and was Negative for DVT -ABG as below -start Budesonide and Brovana -Patient is not on Any Anticoagulation -C/w Losartan 25 mg po Daily -Start Ceftriaxone and Azithromycin -Start Flutter Valve, Incentive Spirometry, and Guaifenesin 600 mg po BID  Acute on chronic HFpEF -Start Diuresis with Furosemide 60 mg IV q12h and will continue  -neg 1.6L -Cardiology Consulted for further evaluation and recommendations -Continue to Monitor for S/Sx of Volume overload -05/15/22 Echo 55-60%, now WMA, G1DD -continue IV lasix per cardiology recs   HTN -C/w Antihypertensives with Cardiology adjustments -Continue to Monitor to BP per Protocol  -Last BP reading was 118/50   Somnolence and Drowsiness, improved  -Unclear Etiology but could be Narcotic Releated. There is documented Hx of being on Suboxone and followed at the [facility]; Review of PDMP she recently had Clonazepam 2 mg and Suboxone 4 mg -1 mg SL Film was filled on 7/10 and 7/8 -? Related to Narcotics; Hold Nartocic Medications  at this Time and Benzodizaepines -Checked TSH (1.644 and 2.301), RPR (non-reactive), B12 (502), Ammonia Level (30) -Try Ammonia Inhalant -ABG: ABG ABG         Component Value Date/Time    PHART 7.376 10/23/2022 1711    PCO2ART 59.7 (H) 10/23/2022 1711    PO2ART 65 (L) 10/23/2022 1711    HCO3 35.0 (H) 10/23/2022 1711    TCO2 37 (H) 10/23/2022 1711    O2SAT 91 10/23/2022 1711  -Was going to be placed on BiPAP but woke up -Head CT was ordered and showed "No evidence of acute intracranial abnormality."  Opiod Dependence -PDMP reviewed -pt is on buprenorphine Suboxone 4 mg -1 mg SL Film was filled on 7/10    Leukocytosis -WBC Trend:       Recent Labs  Lab 10/02/22 1533 10/11/22 2100 10/23/22 0838 10/24/22 0623  WBC 17.7* 15.6* 19.1* 18.6*  -Check Blood Cx x2 -U/A Negative -Procalcitonin was <0.10 -CT Scan as above -Start Ceftriaxone and Azithromycin -Lactic Acid Level Trend:       Recent Labs  Lab 10/23/22 1716 10/23/22 2145 10/24/22 0623  LATICACIDVEN 0.9 3.2* 1.9  -SARS CoV-2 Negative -Continue to Monitor for S/Sx of Infection -Repeat CBC in the AM   Hypothyroidism -C/w Levothyroxine -Check TSH was 1.644   HLD -C/w Rosuvastatin 10 mg po at bedtime   Normocytic Anemia -Hgb/Hct Trend:          Recent Labs  Lab 10/02/22 1533 10/11/22 2100 10/23/22 0838 10/23/22 1108 10/23/22 1434 10/23/22 1711 10/24/22 0623  HGB 12.4 12.4 10.7* 12.2 11.2* 11.9* 10.8*  HCT 39.6 39.3 34.6* 36.0 33.0* 35.0* 34.8*  MCV 92.3 94.5 96.6  --   --   --  96.4  -Checked Anemia Panel and showed an Iron Level of 15, UIBC 304, TIBC of 319, Saturation Ratios 5, Ferritin 222, Folate of 19.1, and Vitamin B12  -C/w Ferrous Sulfate 325 mg po daily -Continue to Monitor for S/Sx of Bleeding; No overt bleeding noted -Repeat CBC in the AM    Hypokalemia -Patient's K+ Level Trend:          Recent Labs  Lab 10/02/22 1533 10/11/22 2100 10/23/22 0838 10/23/22 1108 10/23/22 1434  10/23/22 1711 10/24/22 0623  K 3.8 3.8 3.6 3.4* 3.3* 3.1* 3.5  -Replete with Potassium Chloride 40 mEQ BID -Continue to Monitor and Replete as Necessary -Repeat CMP in the AM    Severe COPD -C/w Supplemental O2 -See Nebs As Above SpO2: (!) 88 % O2 Flow Rate (L/min): 4 L/min -Obtained CTA PE as above -Continue to Monitor Respiratory Status Carefully and make adjustments as necessary   Morbid Obesity -Complicates overall prognosis and care -Estimated body mass index is 46.56 kg/m as calculated from the following:   Height as of this encounter: 5\' 5"  (1.651 m).   Weight as of this encounter: 126.9 kg.  -Weight Loss and Dietary Counseling given         Family Communication:   Family at bedside  Consultants:    Code Status:  FULL / DNR  DVT Prophylaxis:  [place] Heparin / [place] Lovenox   Procedures: As Listed in Progress Note Above  Antibiotics: None  RN Pressure Injury Documentation:        Subjective:   Objective: Vitals:   10/25/22 1139 10/25/22 1300 10/25/22 1500 10/25/22 1606  BP: 112/66   117/60  Pulse: 96   96  Resp:  20 20 17   Temp: 98.9 F (37.2 C)   98.6 F (37 C)  TempSrc: Oral   Oral  SpO2: 93%   95%  Weight:      Height:        Intake/Output Summary (Last 24 hours) at 10/25/2022 1834 Last data filed at 10/25/2022 1437 Gross per 24 hour  Intake 940 ml  Output 2000 ml  Net -1060 ml   Weight change: 1.3 kg Exam:  General:  Pt is alert, follows commands appropriately, not in acute distress HEENT: No icterus, No thrush, No neck mass, /AT Cardiovascular: RRR, S1/S2, no rubs, no gallops Respiratory: CTA bilaterally, no wheezing, no crackles, no rhonchi Abdomen: Soft/+BS, non tender, non distended, no guarding Extremities: No edema, No lymphangitis, No petechiae, No rashes, no synovitis   Data Reviewed: I have personally reviewed following labs and imaging studies Basic Metabolic Panel: Recent Labs  Lab 10/23/22 0838 10/23/22 1108  10/23/22 1434 10/23/22 1711 10/24/22 0623 10/25/22 0711  NA 138 137 138 138 136 136  K 3.6 3.4* 3.3* 3.1* 3.5 4.0  CL 98  --   --   --  95* 98  CO2 31  --   --   --  30 29  GLUCOSE 123*  --   --   --  124* 114*  BUN 5*  --   --   --  6* 6*  CREATININE 0.85  --   --   --  0.77 0.71  CALCIUM 8.5*  --   --   --  8.0* 8.1*  MG  --   --   --   --  1.9 1.8  PHOS  --   --   --   --  2.9 3.5   Liver Function Tests: Recent Labs  Lab 10/24/22 0623 10/25/22 0711  AST 18 16  ALT 14 13  ALKPHOS 66 64  BILITOT 0.7 0.7  PROT 7.1 7.3  ALBUMIN 3.4* 3.3*   No results for input(s): "LIPASE", "AMYLASE" in the last 168 hours. Recent Labs  Lab 10/23/22 2145  AMMONIA 30   Coagulation Profile: No results for input(s): "INR", "PROTIME" in the last 168 hours. CBC: Recent Labs  Lab 10/23/22 0838 10/23/22 1108 10/23/22 1434 10/23/22 1711 10/24/22 0623 10/25/22 0711  WBC 19.1*  --   --   --  18.6* 13.8*  NEUTROABS  --   --   --   --  15.2* 11.4*  HGB 10.7* 12.2 11.2* 11.9* 10.8* 12.2  HCT 34.6* 36.0 33.0* 35.0* 34.8* 39.0  MCV 96.6  --   --   --  96.4 94.9  PLT 268  --   --   --  248 259   Cardiac Enzymes: No results for input(s): "CKTOTAL", "CKMB", "CKMBINDEX", "TROPONINI" in the last 168 hours. BNP: Invalid input(s): "POCBNP" CBG: Recent Labs  Lab 10/23/22 1433 10/24/22 0751 10/25/22 0549  GLUCAP 121* 111* 130*   HbA1C: Recent Labs    10/24/22 0623  HGBA1C 6.5*   Urine analysis:    Component Value Date/Time   COLORURINE YELLOW 10/23/2022 2004   APPEARANCEUR CLEAR 10/23/2022 2004   LABSPEC 1.008 10/23/2022 2004   PHURINE 7.0 10/23/2022 2004   GLUCOSEU NEGATIVE 10/23/2022 2004   HGBUR NEGATIVE 10/23/2022 2004   BILIRUBINUR NEGATIVE 10/23/2022 2004   KETONESUR NEGATIVE 10/23/2022 2004   PROTEINUR NEGATIVE 10/23/2022 2004   UROBILINOGEN 0.2 02/28/2012 2004   NITRITE NEGATIVE 10/23/2022 2004   LEUKOCYTESUR NEGATIVE 10/23/2022 2004   Sepsis  Labs: @LABRCNTIP (procalcitonin:4,lacticidven:4) ) Recent Results (from the past 240 hour(s))  SARS Coronavirus 2 by RT PCR (hospital order, performed in [facility] hospital lab) *cepheid single result test* Anterior Nasal Swab     Status: None   Collection Time: 10/23/22  9:53 AM   Specimen: Anterior Nasal Swab  Result Value Ref Range Status   SARS Coronavirus 2 by RT PCR NEGATIVE NEGATIVE Final    Comment: Performed at [facility], [address], [place], [place] [address]  Blood culture (routine x 2)     Status: None (Preliminary result)   Collection Time: 10/23/22  4:15 PM   Specimen: BLOOD LEFT ARM  Result Value Ref Range Status   Specimen Description BLOOD LEFT ARM  Final   Special Requests   Final    BOTTLES DRAWN AEROBIC AND ANAEROBIC Blood Culture adequate volume   Culture   Final    NO GROWTH 2 DAYS Performed at [facility], [address], [place], [place] [address]    Report Status PENDING  Incomplete  Blood culture (routine x 2)     Status: None (Preliminary result)   Collection Time: 10/23/22  5:16 PM   Specimen: BLOOD  Result Value Ref Range Status   Specimen Description BLOOD SITE NOT SPECIFIED  Final   Special Requests   Final    BOTTLES DRAWN AEROBIC ONLY Blood Culture adequate volume   Culture   Final    NO GROWTH 2 DAYS Performed at [facility], [address] [place], [place] [address]    Report Status PENDING  Incomplete     Scheduled Meds:  allopurinol  100 mg Oral Daily   ammonia  1 each Inhalation Once  arformoterol  15 mcg Nebulization BID   aspirin EC  81 mg Oral Daily   azithromycin  500 mg Oral QPM   budesonide (PULMICORT) nebulizer solution  0.25 mg Nebulization BID   buprenorphine-naloxone  2 tablet Sublingual TID   busPIRone  5 mg Oral TID   clonazePAM  2 mg Oral TID   enoxaparin (LOVENOX) injection  60 mg Subcutaneous Q24H   ferrous sulfate  325 mg Oral Q breakfast   fluticasone  2 spray Each Nare Daily    furosemide  80 mg Intravenous Q12H   guaiFENesin  600 mg Oral BID   levothyroxine  224 mcg Oral QAC breakfast   loratadine  10 mg Oral QHS   losartan  25 mg Oral Daily   naLOXone (NARCAN)  injection  0.4 mg Intravenous Once   pantoprazole  40 mg Oral Daily   rifaximin  550 mg Oral TID   rosuvastatin  10 mg Oral QPM   senna-docusate  1 tablet Oral Daily   valACYclovir  1,000 mg Oral Daily   Continuous Infusions:  cefTRIAXone (ROCEPHIN)  IV Stopped (10/24/22 2039)    Procedures/Studies: DG CHEST PORT 1 VIEW  Result Date: 10/25/2022 CLINICAL DATA:  Shortness of breath. EXAM: PORTABLE CHEST 1 VIEW COMPARISON:  CT chest and chest x-ray from yesterday. FINDINGS: Unchanged mild cardiomegaly and pulmonary vascular congestion. Unchanged bibasilar atelectasis. No pneumothorax or large pleural effusion. No acute osseous abnormality. IMPRESSION: 1. Unchanged mild cardiomegaly and pulmonary vascular congestion. Electronically Signed   By: Obie Dredge M.D.   On: 10/25/2022 10:15   CT HEAD WO CONTRAST ( )  Result Date: 10/24/2022 CLINICAL DATA:  Mental status change, unknown cause EXAM: CT HEAD WITHOUT CONTRAST TECHNIQUE: Contiguous axial images were obtained from the base of the skull through the vertex without intravenous contrast. RADIATION DOSE REDUCTION: This exam was performed according to the departmental dose-optimization program which includes automated exposure control, adjustment of the mA and/or kV according to patient size and/or use of iterative reconstruction technique. COMPARISON:  None Available. FINDINGS: Brain: No evidence of acute infarction, hemorrhage, hydrocephalus, extra-axial collection or mass lesion/mass effect. Vascular: No hyperdense vessel. Skull: No acute fracture. Sinuses/Orbits: Clear sinuses.  No acute orbital findings. Other: No mastoid effusions. IMPRESSION: No evidence of acute intracranial abnormality. Electronically Signed   By: Feliberto Harts M.D.   On:  10/24/2022 11:24   CT Angio Chest PE W and/or Wo Contrast  Result Date: 10/24/2022 CLINICAL DATA:  Chronic respiratory failure, COPD, high clinical suspicion for PE EXAM: CT ANGIOGRAPHY CHEST WITH CONTRAST TECHNIQUE: Multidetector CT imaging of the chest was performed using the standard protocol during bolus administration of intravenous contrast. Multiplanar CT image reconstructions and MIPs were obtained to evaluate the vascular anatomy. RADIATION DOSE REDUCTION: This exam was performed according to the departmental dose-optimization program which includes automated exposure control, adjustment of the mA and/or kV according to patient size and/or use of iterative reconstruction technique. CONTRAST:  75mL OMNIPAQUE IOHEXOL 350 MG/ML SOLN COMPARISON:  Previous studies including CT done on 09/15/2021 and chest radiograph done today FINDINGS: Cardiovascular: There are no intraluminal filling defects in central pulmonary artery branches. Evaluation of small peripheral pulmonary artery branches is limited by motion artifact and infiltrates. There is homogeneous enhancement in thoracic aorta. Scattered coronary artery calcifications are seen. Heart is enlarged in size. Mediastinum/Nodes: No significant lymphadenopathy is seen. Lungs/Pleura: There are patchy alveolar infiltrates in mid and lower lung fields. There are small patchy ground-glass densities in the parahilar regions. Minimal  pleural effusions are seen. There is no pneumothorax. Upper Abdomen: No acute findings are seen. Musculoskeletal: No acute findings are seen. Review of the MIP images confirms the above findings. IMPRESSION: There is no evidence of central pulmonary artery embolism. Evaluation of small peripheral pulmonary artery branches is limited by motion artifacts and infiltrates. There is no evidence of thoracic aortic dissection. Small scattered coronary artery calcifications are seen. There are patchy infiltrates in both lower lung fields  suggesting atelectasis/pneumonia. There are patchy ground-glass densities in the parahilar regions suggesting scarring or pulmonary edema. Electronically Signed   By: Ernie Avena M.D.   On: 10/24/2022 10:54   X-ray chest PA and lateral  Result Date: 10/24/2022 CLINICAL DATA:  Dyspnea. EXAM: CHEST - 2 VIEW COMPARISON:  October 23, 2022. FINDINGS: Stable cardiomegaly. Minimal bibasilar subsegmental atelectasis is noted with possible small pleural effusions. Bony thorax is unremarkable. IMPRESSION: Minimal bibasilar subsegmental atelectasis is noted with possible small pleural effusions. Electronically Signed   By: Lupita Raider M.D.   On: 10/24/2022 08:44   DG Chest Port 1 View  Result Date: 10/23/2022 CLINICAL DATA:  Chest pain EXAM: PORTABLE CHEST 1 VIEW COMPARISON:  10/11/2022 FINDINGS: Cardiomegaly. Pulmonary vascular prominence and diffuse bilateral interstitial pulmonary opacity. No focal airspace opacity. Osseous structures unremarkable. IMPRESSION: Cardiomegaly with pulmonary vascular prominence and diffuse bilateral interstitial pulmonary opacity, most consistent with edema. No focal airspace opacity. Electronically Signed   By: Jearld Lesch M.D.   On: 10/23/2022 09:12   VAS [place] LOWER EXTREMITY VENOUS (DVT) (ONLY [facility] & [facility])  Result Date: 10/11/2022  Lower Venous DVT Study Patient Name:  NYILAH KIGHT  Date of Exam:   10/11/2022 Medical Rec #: [id number]      Accession #:    [id number] Date of Birth: [birth year]     Patient Gender: F Patient Age:   62 years Exam Location:  [facility] Procedure:      VAS [place] LOWER EXTREMITY VENOUS (DVT) Referring Phys: BRITNI HENDERLY --------------------------------------------------------------------------------  Indications: Pain, and Swelling.  Limitations: Body habitus, poor ultrasound/tissue interface and patient pain intolerance. Comparison Study: Previous exam on 06/05/22 was negative for DVT Performing Technologist: Ernestene Mention RVT, RDMS  Examination  Guidelines: A complete evaluation includes B-mode imaging, spectral Doppler, color Doppler, and power Doppler as needed of all accessible portions of each vessel. Bilateral testing is considered an integral part of a complete examination. Limited examinations for reoccurring indications may be performed as noted. The reflux portion of the exam is performed with the patient in reverse Trendelenburg.  +-----+---------------+---------+-----------+----------+--------------+ RIGHTCompressibilityPhasicitySpontaneityPropertiesThrombus Aging +-----+---------------+---------+-----------+----------+--------------+ CFV  Full           Yes      Yes                                 +-----+---------------+---------+-----------+----------+--------------+   +--------+---------------+---------+-----------+----------+--------------------+ LEFT    CompressibilityPhasicitySpontaneityPropertiesThrombus Aging       +--------+---------------+---------+-----------+----------+--------------------+ CFV     Full           Yes      Yes                                       +--------+---------------+---------+-----------+----------+--------------------+ SFJ     Full                                                              +--------+---------------+---------+-----------+----------+--------------------+  FV Prox Full           Yes      Yes                                       +--------+---------------+---------+-----------+----------+--------------------+ FV Mid  Full           Yes      Yes                                       +--------+---------------+---------+-----------+----------+--------------------+ FV                     Yes      Yes                  patent by            Distal                                               color/doppler        +--------+---------------+---------+-----------+----------+--------------------+ PFV     Full                                                               +--------+---------------+---------+-----------+----------+--------------------+ POP     Full           Yes      Yes                                       +--------+---------------+---------+-----------+----------+--------------------+ PTV     Full                                                              +--------+---------------+---------+-----------+----------+--------------------+ PERO    Full                                                              +--------+---------------+---------+-----------+----------+--------------------+    Summary: RIGHT: - No evidence of common femoral vein obstruction.  LEFT: - No evidence of deep vein thrombosis in the lower extremity. No indirect evidence of obstruction proximal to the inguinal ligament. - No cystic structure found in the popliteal fossa.  *See table(s) above for measurements and observations. Electronically signed by Coral Else MD on 10/11/2022 at 9:19:53 PM.    Final    DG Chest 2 View  Result Date: 10/11/2022 CLINICAL DATA:  Lower extremity edema EXAM: CHEST - 2 VIEW COMPARISON:  10/02/2022 FINDINGS: Stable cardiomediastinal silhouette. No pleural effusion or  pneumothorax. Left-greater-than-right basilar atelectasis. New linear airspace opacity in the left mid lung favored to represent atelectasis, pneumonia less likely though not excluded. No displaced rib fractures. IMPRESSION: Linear airspace opacity in the left mid lung favored to represent atelectasis, pneumonia less likely. Electronically Signed   By: Minerva Fester M.D.   On: 10/11/2022 21:05   DG Knee Complete 4 Views Left  Result Date: 10/11/2022 CLINICAL DATA:  Left lower extremity edema. EXAM: LEFT KNEE - COMPLETE 4+ VIEW COMPARISON:  Left knee radiograph dated 10/02/2022. FINDINGS: There is no acute fracture or dislocation. The bones are osteopenic. There is arthritic changes of the left knee with mild narrowing of the lateral  compartment and spurring. Small suprapatellar effusion. The soft tissues are unremarkable. IMPRESSION: 1. No acute fracture or dislocation. 2. Osteopenia and arthritic changes. Electronically Signed   By: Elgie Collard M.D.   On: 10/11/2022 21:05   DG Knee Complete 4 Views Left  Result Date: 10/02/2022 CLINICAL DATA:  Left knee pain. EXAM: LEFT KNEE - COMPLETE 4+ VIEW COMPARISON:  Left knee radiograph dated 08/29/2021. FINDINGS: There is no acute fracture or dislocation. The bones are osteopenic. There is moderate arthritic changes with tricompartmental narrowing and spurring. Small suprapatellar effusion. The soft tissues are unremarkable. IMPRESSION: 1. No acute fracture or dislocation. 2. Moderate arthritic changes. Electronically Signed   By: Elgie Collard M.D.   On: 10/02/2022 18:37   DG Chest 2 View  Result Date: 10/02/2022 CLINICAL DATA:  Shortness of breath and right-sided neck pain. EXAM: CHEST - 2 VIEW COMPARISON:  07/07/2022 FINDINGS: AP and lateral views, both mildly degraded by patient size. Patient rotated left on the frontal. Surgical clips at the thoracic inlet. Midline trachea. Normal heart size. Atherosclerosis in the transverse aorta. No pleural effusion or pneumothorax. Low lung volumes with left greater than right base subsegmental atelectasis. IMPRESSION: No acute cardiopulmonary disease. Aortic Atherosclerosis (ICD10-I70.0). Electronically Signed   By: Jeronimo Greaves M.D.   On: 10/02/2022 16:31    Catarina Hartshorn, DO  [facility]  If 7PM-7AM, please contact night-coverage [email or web address] Password [email or web address] 10/25/2022, 6:34 PM   LOS: 2 days

## 2022-10-25 NOTE — Progress Notes (Signed)
Heart Failure Stewardship Pharmacist Progress Note   PCP: Pcp, No PCP-Cardiologist: Marca Ancona, MD    HPI:  62 yo F with PMH of CHF, HTN, SVT, pulmonary HTN, OSA, COPD, HLD, T2DM, morbid obesity, DVT in pregnancy, and tobacco use.   She was admitted in 8/16 with diastolic CHF and COPD exacerbation.  She was admitted in 1/17 with COPD exacerbation.  She was set up for a Cardiolite in 2/17.  This showed a small, moderate intensity partially reversible inferolateral defect.  LHC/RHC in 3/17 showed no significant CAD, mildly elevated PCWP.  PFTs in 3/17 showed severe COPD.     Admitted 10/17 with AECOPD and A/C diastolic CHF.  Diuresed with IV lasix. Completed course for CAP. Pt also noted have multinodular goiter on [place].  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    Echo in 4/18 showed EF 45-50%, moderate dilated RV with moderately decreased systolic function.    She was admitted again in 4/18 with COPD exacerbation, acute on chronic diastolic CHF, and CAP.     She was admitted in 3/20 with acute on chronic diastolic CHF and treated with IV Lasix.  Echo showed EF 60-65%, mild RV enlargement with normal function, respirophasic septal variation.    Repeat 2D echo in 5/21 showed normal LVEF 55-60% (lower than prior study, 60-65%). RV normal. RVSP was normal.   She was last seen by AHF clinic in 09/2019. Multiple ED visits and admissions for HF since with no show to clinics.   Last admitted twice in 2/24 with A/C diastolic CHF. ECHO 05/15/22 with LVEF 55-60%, mild concentric LVH, no RWMA, RV normal. In the ED in 6/24 and 7/24 with edema.   Presented to the ED on 10/23/22 with chest pain, shortness of breath, and LE edema. States she ran out of medications a week ago. CXR with pulmonary vascular congestion and cardiomegaly. CTA negative for PE or dissection.   Current HF Medications: Diuretic: furosemide 80 mg IV BID ACE/ARB/ARNI: losartan 25 mg daily  Prior to  admission HF Medications: Diuretic: torsemide 20 mg BID Beta blocker: carvedilol 3.125 mg BID ACE/ARB/ARNI: lisinopril 5 mg daily  Pertinent Lab Values: Serum creatinine 0.71, BUN 6, Potassium 4.0, Sodium 136, BNP 38.8, Magnesium 1.8, A1c 6.5   Vital Signs: Weight: 276 lbs (admission weight: 279 lbs) Blood pressure: 120/60s  Heart rate: 90s  I/O: net -0.6L yesterday; net -2.2L since admission  Medication Assistance / Insurance Benefits Check: Does the patient have prescription insurance?  Yes Type of insurance plan: [place] Medicaid  Outpatient Pharmacy:  Prior to admission outpatient pharmacy: [facility] Is the patient willing to use [facility] TOC pharmacy at discharge? Yes Is the patient willing to transition their outpatient pharmacy to utilize a [facility] outpatient pharmacy?   No - gets pill packs with [facility]    Assessment: 1. Acute on chronic diastolic CHF (LVEF 55-60%), due to NICM. NYHA class III symptoms. - Agree with increasing to furosemide 80 mg IV BID. Strict I/Os and daily weights. Keep K>4 and Mg>2. - Holding carvedilol with severe COPD - Continue losartan 25 mg daily. May be able to transition to [facility] for HFpEF before discharge.  - Consider adding spironolactone 12.5 mg daily - Caution SGLT2i with body habitus  Plan: 1) Medication changes recommended at this time: - Add spironolactone 12.5 mg daily   2) Patient assistance: - None pending  3)  Education  - Patient has been educated on current HF medications and potential  additions to HF medication regimen - Patient verbalizes understanding that over the next few months, these medication doses may change and more medications may be added to optimize HF regimen - Patient has been educated on basic disease state pathophysiology and goals of therapy    Sharen Hones, PharmD, BCPS Heart Failure Stewardship Pharmacist Phone [phone]

## 2022-10-25 NOTE — Progress Notes (Signed)
Rounding Note    Patient Name: Ceira Hoeschen Date of Encounter: 10/25/2022  [facility] Cardiologist: Marca Ancona, MD 2021  Subjective   Pt breathing better   No CP   Inpatient Medications    Scheduled Meds:  ammonia  1 each Inhalation Once   arformoterol  15 mcg Nebulization BID   aspirin EC  81 mg Oral Daily   azithromycin  500 mg Oral QPM   budesonide (PULMICORT) nebulizer solution  0.25 mg Nebulization BID   busPIRone  5 mg Oral TID   enoxaparin (LOVENOX) injection  60 mg Subcutaneous Q24H   ferrous sulfate  325 mg Oral Q breakfast   furosemide  60 mg Intravenous Q12H   guaiFENesin  600 mg Oral BID   levothyroxine  224 mcg Oral QAC breakfast   loratadine  10 mg Oral QHS   losartan  25 mg Oral Daily   naLOXone (NARCAN)  injection  0.4 mg Intravenous Once   pantoprazole  40 mg Oral Daily   rifaximin  550 mg Oral TID   rosuvastatin  10 mg Oral QPM   senna-docusate  1 tablet Oral Daily   valACYclovir  1,000 mg Oral Daily   Continuous Infusions:  cefTRIAXone (ROCEPHIN)  IV Stopped (10/24/22 2039)   PRN Meds: acetaminophen **OR** acetaminophen, levalbuterol, loratadine, nitroGLYCERIN, ondansetron **OR** ondansetron (ZOFRAN) IV, polyethylene glycol   Vital Signs    Vitals:   10/24/22 1948 10/24/22 2018 10/24/22 2342 10/25/22 0552  BP: (!) 108/45  111/62 126/65  Pulse: 88 95 81 87  Resp: 17 17 17  (!) 21  Temp: 98.5 F (36.9 C)  98.2 F (36.8 C) 98.8 F (37.1 C)  TempSrc: Oral  Oral Oral  SpO2: 93% 92% 92% 91%  Weight:    125.3 kg  Height:        Intake/Output Summary (Last 24 hours) at 10/25/2022 0729 Last data filed at 10/25/2022 0318 Gross per 24 hour  Intake 1320 ml  Output 2000 ml  Net -680 ml      10/25/2022    5:52 AM 10/24/2022    2:52 AM 10/23/2022    8:18 AM  Last 3 Weights  Weight (lbs) 276 lb 3.8 oz 279 lb 12.8 oz 273 lb 5.9 oz  Weight (kg) 125.3 kg 126.916 kg 124 kg      Telemetry    SR  - Personally Reviewed  ECG     No new  - Personally Reviewed  Physical Exam   GEN: Morbidly obese   In no  acute distress.   Neck: Neck is full  Cardiac: RRR, no murmur Respiratory:Moving air  No wheezes   GI: Obese   MS: Tr edema LE   Labs    High Sensitivity Troponin:   Recent Labs  Lab 10/02/22 1533 10/02/22 2138 10/23/22 0838 10/23/22 1038  TROPONINIHS 7 7 35* 35*     Chemistry Recent Labs  Lab 10/23/22 0838 10/23/22 1108 10/23/22 1434 10/23/22 1711 10/24/22 0623  NA 138   < > 138 138 136  K 3.6   < > 3.3* 3.1* 3.5  CL 98  --   --   --  95*  CO2 31  --   --   --  30  GLUCOSE 123*  --   --   --  124*  BUN 5*  --   --   --  6*  CREATININE 0.85  --   --   --  0.77  CALCIUM 8.5*  --   --   --  8.0*  MG  --   --   --   --  1.9  PROT  --   --   --   --  7.1  ALBUMIN  --   --   --   --  3.4*  AST  --   --   --   --  18  ALT  --   --   --   --  14  ALKPHOS  --   --   --   --  66  BILITOT  --   --   --   --  0.7  GFRNONAA >60  --   --   --  >60  ANIONGAP 9  --   --   --  11   < > = values in this interval not displayed.    Lipids No results for input(s): "CHOL", "TRIG", "HDL", "LABVLDL", "LDLCALC", "CHOLHDL" in the last 168 hours.  Hematology Recent Labs  Lab 10/23/22 0838 10/23/22 1108 10/23/22 1434 10/23/22 1711 10/24/22 0623  WBC 19.1*  --   --   --  18.6*  RBC 3.58*  --   --   --  3.61*  3.83*  HGB 10.7*   < > 11.2* 11.9* 10.8*  HCT 34.6*   < > 33.0* 35.0* 34.8*  MCV 96.6  --   --   --  96.4  MCH 29.9  --   --   --  29.9  MCHC 30.9  --   --   --  31.0  RDW 14.5  --   --   --  14.6  PLT 268  --   --   --  248   < > = values in this interval not displayed.   Thyroid  Recent Labs  Lab 10/24/22 0623  TSH 2.301    BNP Recent Labs  Lab 10/23/22 0838 10/24/22 0623  BNP 130.6* 38.8    DDimer No results for input(s): "DDIMER" in the last 168 hours.   Radiology    CT HEAD WO CONTRAST ( )  Result Date: 10/24/2022 CLINICAL DATA:  Mental status change, unknown cause  EXAM: CT HEAD WITHOUT CONTRAST TECHNIQUE: Contiguous axial images were obtained from the base of the skull through the vertex without intravenous contrast. RADIATION DOSE REDUCTION: This exam was performed according to the departmental dose-optimization program which includes automated exposure control, adjustment of the mA and/or kV according to patient size and/or use of iterative reconstruction technique. COMPARISON:  None Available. FINDINGS: Brain: No evidence of acute infarction, hemorrhage, hydrocephalus, extra-axial collection or mass lesion/mass effect. Vascular: No hyperdense vessel. Skull: No acute fracture. Sinuses/Orbits: Clear sinuses.  No acute orbital findings. Other: No mastoid effusions. IMPRESSION: No evidence of acute intracranial abnormality. Electronically Signed   By: Feliberto Harts M.D.   On: 10/24/2022 11:24   CT Angio Chest PE W and/or Wo Contrast  Result Date: 10/24/2022 CLINICAL DATA:  Chronic respiratory failure, COPD, high clinical suspicion for PE EXAM: CT ANGIOGRAPHY CHEST WITH CONTRAST TECHNIQUE: Multidetector CT imaging of the chest was performed using the standard protocol during bolus administration of intravenous contrast. Multiplanar CT image reconstructions and MIPs were obtained to evaluate the vascular anatomy. RADIATION DOSE REDUCTION: This exam was performed according to the departmental dose-optimization program which includes automated exposure control, adjustment of the mA and/or kV according to patient size and/or use of iterative reconstruction technique. CONTRAST:  75mL OMNIPAQUE IOHEXOL 350 MG/ML SOLN COMPARISON:  Previous studies including CT done on  09/15/2021 and chest radiograph done today FINDINGS: Cardiovascular: There are no intraluminal filling defects in central pulmonary artery branches. Evaluation of small peripheral pulmonary artery branches is limited by motion artifact and infiltrates. There is homogeneous enhancement in thoracic aorta. Scattered  coronary artery calcifications are seen. Heart is enlarged in size. Mediastinum/Nodes: No significant lymphadenopathy is seen. Lungs/Pleura: There are patchy alveolar infiltrates in mid and lower lung fields. There are small patchy ground-glass densities in the parahilar regions. Minimal pleural effusions are seen. There is no pneumothorax. Upper Abdomen: No acute findings are seen. Musculoskeletal: No acute findings are seen. Review of the MIP images confirms the above findings. IMPRESSION: There is no evidence of central pulmonary artery embolism. Evaluation of small peripheral pulmonary artery branches is limited by motion artifacts and infiltrates. There is no evidence of thoracic aortic dissection. Small scattered coronary artery calcifications are seen. There are patchy infiltrates in both lower lung fields suggesting atelectasis/pneumonia. There are patchy ground-glass densities in the parahilar regions suggesting scarring or pulmonary edema. Electronically Signed   By: Ernie Avena M.D.   On: 10/24/2022 10:54   X-ray chest PA and lateral  Result Date: 10/24/2022 CLINICAL DATA:  Dyspnea. EXAM: CHEST - 2 VIEW COMPARISON:  October 23, 2022. FINDINGS: Stable cardiomegaly. Minimal bibasilar subsegmental atelectasis is noted with possible small pleural effusions. Bony thorax is unremarkable. IMPRESSION: Minimal bibasilar subsegmental atelectasis is noted with possible small pleural effusions. Electronically Signed   By: Lupita Raider M.D.   On: 10/24/2022 08:44   DG Chest Port 1 View  Result Date: 10/23/2022 CLINICAL DATA:  Chest pain EXAM: PORTABLE CHEST 1 VIEW COMPARISON:  10/11/2022 FINDINGS: Cardiomegaly. Pulmonary vascular prominence and diffuse bilateral interstitial pulmonary opacity. No focal airspace opacity. Osseous structures unremarkable. IMPRESSION: Cardiomegaly with pulmonary vascular prominence and diffuse bilateral interstitial pulmonary opacity, most consistent with edema. No focal  airspace opacity. Electronically Signed   By: Jearld Lesch M.D.   On: 10/23/2022 09:12    Cardiac Studies    Echo  Feb 2024   1. Left ventricular ejection fraction, by estimation, is 55 to 60%. The  left ventricle has normal function. The left ventricle has no regional  wall motion abnormalities. There is mild concentric left ventricular  hypertrophy. Left ventricular diastolic  parameters are consistent with Grade I diastolic dysfunction (impaired  relaxation).   2. Very mildly D-shaped interventricular septum suggestive of RV  pressure/volume overload. Right ventricular systolic function is normal.  The right ventricular size is normal.   3. Right atrial size was mildly dilated.   4. The mitral valve is normal in structure. Trivial mitral valve  regurgitation. No evidence of mitral stenosis.   5. The aortic valve is tricuspid. Aortic valve regurgitation is not  visualized. No aortic stenosis is present.   6. IVC not visualized. Peak RV-RA gradient 17 mmHg.   7. Technically difficult study with poor acoustic windows.     Patient Profile     Telena Peyser is a 62 y.o. female with a hx of  HFpEF who is being seen 10/23/2022 for the evaluation of SOB and edema at the request of Dr Marland Mcalpine.   Assessment & Plan    # HFpEF   Acute on chronic    In setting of severe COPD   Pt also ran out of meds about 1 week before adimt  So far has diuresed 2.24 L   I would continue diuresis with IV lasix  Losartan added     With her  COPD and body habitus Jardiance may be difficult  # Severe COPD  and asthma   Continue O2 [place]  # HTN   BP controlled   #HL  Continue statin  For questions or updates, please contact [facility] Please consult [email or web address] for contact info under        Signed, Dietrich Pates, MD  10/25/2022, 7:29 AM

## 2022-10-26 DIAGNOSIS — J9601 Acute respiratory failure with hypoxia: Secondary | ICD-10-CM | POA: Diagnosis not present

## 2022-10-26 DIAGNOSIS — J9602 Acute respiratory failure with hypercapnia: Secondary | ICD-10-CM | POA: Diagnosis not present

## 2022-10-26 DIAGNOSIS — I5033 Acute on chronic diastolic (congestive) heart failure: Secondary | ICD-10-CM | POA: Diagnosis not present

## 2022-10-26 LAB — CBC
HCT: 37.2 % (ref 36.0–46.0)
Hemoglobin: 11.6 g/dL — ABNORMAL LOW (ref 12.0–15.0)
MCH: 29.9 pg (ref 26.0–34.0)
MCHC: 31.2 g/dL (ref 30.0–36.0)
MCV: 95.9 fL (ref 80.0–100.0)
Platelets: 281 10*3/uL (ref 150–400)
RBC: 3.88 MIL/uL (ref 3.87–5.11)
RDW: 14.3 % (ref 11.5–15.5)
WBC: 13.5 10*3/uL — ABNORMAL HIGH (ref 4.0–10.5)
nRBC: 0 % (ref 0.0–0.2)

## 2022-10-26 LAB — BASIC METABOLIC PANEL
Anion gap: 9 (ref 5–15)
BUN: 9 mg/dL (ref 8–23)
CO2: 31 mmol/L (ref 22–32)
Calcium: 8.2 mg/dL — ABNORMAL LOW (ref 8.9–10.3)
Chloride: 96 mmol/L — ABNORMAL LOW (ref 98–111)
Creatinine, Ser: 0.84 mg/dL (ref 0.44–1.00)
GFR, Estimated: >60 mL/min (ref >60)
Glucose, Bld: 114 mg/dL — ABNORMAL HIGH (ref 70–99)
Potassium: 3.5 mmol/L (ref 3.5–5.1)
Sodium: 136 mmol/L (ref 135–145)

## 2022-10-26 LAB — GLUCOSE, CAPILLARY: Glucose-Capillary: 101 mg/dL — ABNORMAL HIGH (ref 70–99)

## 2022-10-26 MED ORDER — FERROUS SULFATE 325 (65 FE) MG PO TABS
325.0000 mg | ORAL_TABLET | ORAL | Status: DC
  Administered 2022-10-28: 325 mg via ORAL
  Filled 2022-10-26: qty 1
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
## 2022-10-26 NOTE — Plan of Care (Signed)
  Problem: Education: Goal: Ability to verbalize understanding of medication therapies will improve Outcome: Progressing   Problem: Education: Goal: Ability to demonstrate management of disease process will improve Outcome: Progressing   Problem: Education: Goal: Individualized Educational Video(s) Outcome: Progressing

## 2022-10-26 NOTE — Plan of Care (Signed)
  Problem: Cardiac: Goal: Ability to achieve and maintain adequate cardiopulmonary perfusion will improve Outcome: Progressing   Problem: Clinical Measurements: Goal: Respiratory complications will improve Outcome: Progressing   Problem: Activity: Goal: Risk for activity intolerance will decrease Outcome: Progressing   Problem: Nutrition: Goal: Adequate nutrition will be maintained Outcome: Progressing   Problem: Coping: Goal: Level of anxiety will decrease Outcome: Progressing

## 2022-10-26 NOTE — Progress Notes (Signed)
   10/26/22 2030  BiPAP/CPAP/SIPAP  Reason BIPAP/CPAP not in use Non-compliant   Refused

## 2022-10-26 NOTE — Progress Notes (Signed)
   10/26/22 0009  BiPAP/CPAP/SIPAP  Reason BIPAP/CPAP not in use Non-compliant

## 2022-10-26 NOTE — Progress Notes (Signed)
Rounding Note    Patient Name: Michele Owens Date of Encounter: 10/26/2022  [facility] Cardiologist: Marca Ancona, MD 2021  Subjective   Breathing improved from yesterday   No CP   Pt very chatty   Inpatient Medications    Scheduled Meds:  allopurinol  100 mg Oral Daily   ammonia  1 each Inhalation Once   arformoterol  15 mcg Nebulization BID   aspirin EC  81 mg Oral Daily   azithromycin  500 mg Oral QPM   budesonide (PULMICORT) nebulizer solution  0.25 mg Nebulization BID   buprenorphine-naloxone  2 tablet Sublingual TID   busPIRone  5 mg Oral TID   clonazePAM  2 mg Oral TID   enoxaparin (LOVENOX) injection  60 mg Subcutaneous Q24H   ferrous sulfate  325 mg Oral Q breakfast   fluticasone  2 spray Each Nare Daily   furosemide  80 mg Intravenous Q12H   guaiFENesin  600 mg Oral BID   levothyroxine  224 mcg Oral QAC breakfast   loratadine  10 mg Oral QHS   losartan  25 mg Oral Daily   naLOXone (NARCAN)  injection  0.4 mg Intravenous Once   pantoprazole  40 mg Oral Daily   rifaximin  550 mg Oral TID   rosuvastatin  10 mg Oral QPM   senna-docusate  1 tablet Oral Daily   valACYclovir  1,000 mg Oral Daily   Continuous Infusions:  cefTRIAXone (ROCEPHIN)  IV Stopped (10/25/22 2230)   PRN Meds: acetaminophen **OR** acetaminophen, levalbuterol, loratadine, nitroGLYCERIN, ondansetron **OR** ondansetron (ZOFRAN) IV, polyethylene glycol   Vital Signs    Vitals:   10/26/22 0009 10/26/22 0510 10/26/22 0518 10/26/22 0700  BP:  125/76  110/81  Pulse: 80 83  (!) 107  Resp:  16  16  Temp:  98.4 F (36.9 C)  98.6 F (37 C)  TempSrc:  Oral  Oral  SpO2: 93% 95%    Weight:   127.2 kg   Height:        Intake/Output Summary (Last 24 hours) at 10/26/2022 0902 Last data filed at 10/25/2022 2230 Gross per 24 hour  Intake 340 ml  Output 900 ml  Net -560 ml   Net neg 3.22 L      10/26/2022    5:18 AM 10/25/2022    5:52 AM 10/24/2022    2:52 AM  Last 3 Weights   Weight (lbs) 280 lb 6.8 oz 276 lb 3.8 oz 279 lb 12.8 oz  Weight (kg) 127.2 kg 125.3 kg 126.916 kg      Telemetry    Sinus rhythm    - Personally Reviewed  ECG    No new  - Personally Reviewed  Physical Exam   GEN: Morbidly obese   In no  acute distress.   Neck: Neck is full  Cardiac: RRR, no murmur Respiratory:Relatively clear  GI: Obese   MS: No LE edema   Labs    High Sensitivity Troponin:   Recent Labs  Lab 10/02/22 1533 10/02/22 2138 10/23/22 0838 10/23/22 1038  TROPONINIHS 7 7 35* 35*     Chemistry Recent Labs  Lab 10/24/22 0623 10/25/22 0711 10/26/22 0205  NA 136 136 136  K 3.5 4.0 3.5  CL 95* 98 96*  CO2 30 29 31   GLUCOSE 124* 114* 114*  BUN 6* 6* 9  CREATININE 0.77 0.71 0.84  CALCIUM 8.0* 8.1* 8.2*  MG 1.9 1.8  --   PROT 7.1 7.3  --  ALBUMIN 3.4* 3.3*  --   AST 18 16  --   ALT 14 13  --   ALKPHOS 66 64  --   BILITOT 0.7 0.7  --   GFRNONAA >60 >60 >60  ANIONGAP 11 9 9     Lipids No results for input(s): "CHOL", "TRIG", "HDL", "LABVLDL", "LDLCALC", "CHOLHDL" in the last 168 hours.  Hematology Recent Labs  Lab 10/24/22 0623 10/25/22 0711 10/26/22 0205  WBC 18.6* 13.8* 13.5*  RBC 3.61*  3.83* 4.11 3.88  HGB 10.8* 12.2 11.6*  HCT 34.8* 39.0 37.2  MCV 96.4 94.9 95.9  MCH 29.9 29.7 29.9  MCHC 31.0 31.3 31.2  RDW 14.6 14.4 14.3  PLT 248 259 281   Thyroid  Recent Labs  Lab 10/24/22 0623  TSH 2.301    BNP Recent Labs  Lab 10/23/22 0838 10/24/22 0623  BNP 130.6* 38.8    DDimer No results for input(s): "DDIMER" in the last 168 hours.   Radiology    DG CHEST PORT 1 VIEW  Result Date: 10/25/2022 CLINICAL DATA:  Shortness of breath. EXAM: PORTABLE CHEST 1 VIEW COMPARISON:  CT chest and chest x-ray from yesterday. FINDINGS: Unchanged mild cardiomegaly and pulmonary vascular congestion. Unchanged bibasilar atelectasis. No pneumothorax or large pleural effusion. No acute osseous abnormality. IMPRESSION: 1. Unchanged mild cardiomegaly  and pulmonary vascular congestion. Electronically Signed   By: Obie Dredge M.D.   On: 10/25/2022 10:15   CT HEAD WO CONTRAST ( )  Result Date: 10/24/2022 CLINICAL DATA:  Mental status change, unknown cause EXAM: CT HEAD WITHOUT CONTRAST TECHNIQUE: Contiguous axial images were obtained from the base of the skull through the vertex without intravenous contrast. RADIATION DOSE REDUCTION: This exam was performed according to the departmental dose-optimization program which includes automated exposure control, adjustment of the mA and/or kV according to patient size and/or use of iterative reconstruction technique. COMPARISON:  None Available. FINDINGS: Brain: No evidence of acute infarction, hemorrhage, hydrocephalus, extra-axial collection or mass lesion/mass effect. Vascular: No hyperdense vessel. Skull: No acute fracture. Sinuses/Orbits: Clear sinuses.  No acute orbital findings. Other: No mastoid effusions. IMPRESSION: No evidence of acute intracranial abnormality. Electronically Signed   By: Feliberto Harts M.D.   On: 10/24/2022 11:24   CT Angio Chest PE W and/or Wo Contrast  Result Date: 10/24/2022 CLINICAL DATA:  Chronic respiratory failure, COPD, high clinical suspicion for PE EXAM: CT ANGIOGRAPHY CHEST WITH CONTRAST TECHNIQUE: Multidetector CT imaging of the chest was performed using the standard protocol during bolus administration of intravenous contrast. Multiplanar CT image reconstructions and MIPs were obtained to evaluate the vascular anatomy. RADIATION DOSE REDUCTION: This exam was performed according to the departmental dose-optimization program which includes automated exposure control, adjustment of the mA and/or kV according to patient size and/or use of iterative reconstruction technique. CONTRAST:  75mL OMNIPAQUE IOHEXOL 350 MG/ML SOLN COMPARISON:  Previous studies including CT done on 09/15/2021 and chest radiograph done today FINDINGS: Cardiovascular: There are no intraluminal  filling defects in central pulmonary artery branches. Evaluation of small peripheral pulmonary artery branches is limited by motion artifact and infiltrates. There is homogeneous enhancement in thoracic aorta. Scattered coronary artery calcifications are seen. Heart is enlarged in size. Mediastinum/Nodes: No significant lymphadenopathy is seen. Lungs/Pleura: There are patchy alveolar infiltrates in mid and lower lung fields. There are small patchy ground-glass densities in the parahilar regions. Minimal pleural effusions are seen. There is no pneumothorax. Upper Abdomen: No acute findings are seen. Musculoskeletal: No acute findings are seen. Review of the  MIP images confirms the above findings. IMPRESSION: There is no evidence of central pulmonary artery embolism. Evaluation of small peripheral pulmonary artery branches is limited by motion artifacts and infiltrates. There is no evidence of thoracic aortic dissection. Small scattered coronary artery calcifications are seen. There are patchy infiltrates in both lower lung fields suggesting atelectasis/pneumonia. There are patchy ground-glass densities in the parahilar regions suggesting scarring or pulmonary edema. Electronically Signed   By: Ernie Avena M.D.   On: 10/24/2022 10:54    Cardiac Studies    Echo  Feb 2024   1. Left ventricular ejection fraction, by estimation, is 55 to 60%. The  left ventricle has normal function. The left ventricle has no regional  wall motion abnormalities. There is mild concentric left ventricular  hypertrophy. Left ventricular diastolic  parameters are consistent with Grade I diastolic dysfunction (impaired  relaxation).   2. Very mildly D-shaped interventricular septum suggestive of RV  pressure/volume overload. Right ventricular systolic function is normal.  The right ventricular size is normal.   3. Right atrial size was mildly dilated.   4. The mitral valve is normal in structure. Trivial mitral valve   regurgitation. No evidence of mitral stenosis.   5. The aortic valve is tricuspid. Aortic valve regurgitation is not  visualized. No aortic stenosis is present.   6. IVC not visualized. Peak RV-RA gradient 17 mmHg.   7. Technically difficult study with poor acoustic windows.     Patient Profile     Michele Owens is a 62 y.o. female with a hx of  HFpEF who is being seen 10/23/2022 for the evaluation of SOB and edema at the request of Dr Marland Mcalpine.   Assessment & Plan    # HFpEF   Acute on chronic    In setting of severe COPD   Pt also ran out of meds about 1 week before adimt  Continues to diurese   Volume appears better  Losartan added     With her COPD and body habitus Jardiance may be difficult  # Severe COPD  and asthma   Continue O2 [place]  Moving air    # HTN   BP well controlled   #HL  Continue statin  For questions or updates, please contact [facility] Please consult [email or web address] for contact info under        Signed, Dietrich Pates, MD  10/26/2022, 9:02 AM

## 2022-10-26 NOTE — Progress Notes (Signed)
Heart Failure Stewardship Pharmacist Progress Note   PCP: Pcp, No PCP-Cardiologist: Marca Ancona, MD    HPI:  62 yo F with PMH of CHF, HTN, SVT, pulmonary HTN, OSA, COPD, HLD, T2DM, morbid obesity, DVT in pregnancy, and tobacco use.   She was admitted in 8/16 with diastolic CHF and COPD exacerbation.  She was admitted in 1/17 with COPD exacerbation.  She was set up for a Cardiolite in 2/17.  This showed a small, moderate intensity partially reversible inferolateral defect.  LHC/RHC in 3/17 showed no significant CAD, mildly elevated PCWP.  PFTs in 3/17 showed severe COPD.     Admitted 10/17 with AECOPD and A/C diastolic CHF.  Diuresed with IV lasix. Completed course for CAP. Pt also noted have multinodular goiter on [place].  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    Echo in 4/18 showed EF 45-50%, moderate dilated RV with moderately decreased systolic function.    She was admitted again in 4/18 with COPD exacerbation, acute on chronic diastolic CHF, and CAP.     She was admitted in 3/20 with acute on chronic diastolic CHF and treated with IV Lasix.  Echo showed EF 60-65%, mild RV enlargement with normal function, respirophasic septal variation.    Repeat 2D echo in 5/21 showed normal LVEF 55-60% (lower than prior study, 60-65%). RV normal. RVSP was normal.   She was last seen by AHF clinic in 09/2019. Multiple ED visits and admissions for HF since with no show to clinics.   Last admitted twice in 2/24 with A/C diastolic CHF. ECHO 05/15/22 with LVEF 55-60%, mild concentric LVH, no RWMA, RV normal. In the ED in 6/24 and 7/24 with edema.   Presented to the ED on 10/23/22 with chest pain, shortness of breath, and LE edema. States she ran out of medications a week ago. CXR with pulmonary vascular congestion and cardiomegaly. CTA negative for PE or dissection.   Current HF Medications: Diuretic: furosemide 80 mg IV BID ACE/ARB/ARNI: losartan 25 mg daily  Prior to  admission HF Medications: Diuretic: torsemide 20 mg BID Beta blocker: carvedilol 3.125 mg BID ACE/ARB/ARNI: lisinopril 5 mg daily  Pertinent Lab Values: Serum creatinine 0.84, BUN 9, Potassium 3.5, Sodium 136, BNP 38.8, Magnesium 1.8, A1c 6.5   Vital Signs: Weight: 280 lbs (admission weight: 279 lbs) Blood pressure: 110-120/70s  Heart rate: 80-100s  I/O: net -1.3L yesterday; net -3.2L since admission  Medication Assistance / Insurance Benefits Check: Does the patient have prescription insurance?  Yes Type of insurance plan: [place] Medicaid  Outpatient Pharmacy:  Prior to admission outpatient pharmacy: [facility] Is the patient willing to use [facility] TOC pharmacy at discharge? Yes Is the patient willing to transition their outpatient pharmacy to utilize a [facility] outpatient pharmacy?   No - gets pill packs with [facility]    Assessment: 1. Acute on chronic diastolic CHF (LVEF 55-60%), due to NICM. NYHA class III symptoms. - Continue furosemide 80 mg IV BID. Strict I/Os and daily weights. Keep K>4 and Mg>2. - Holding carvedilol with severe COPD - Continue losartan 25 mg daily. May be able to transition to [facility] for HFpEF before discharge.  - Consider adding spironolactone 12.5 mg daily - Caution SGLT2i with body habitus  Plan: 1) Medication changes recommended at this time: - Add spironolactone 12.5 mg daily   2) Patient assistance: - None pending  3)  Education  - Patient has been educated on current HF medications and potential additions to HF  medication regimen - Patient verbalizes understanding that over the next few months, these medication doses may change and more medications may be added to optimize HF regimen - Patient has been educated on basic disease state pathophysiology and goals of therapy    Sharen Hones, PharmD, BCPS Heart Failure Stewardship Pharmacist Phone [phone]

## 2022-10-27 DIAGNOSIS — J9602 Acute respiratory failure with hypercapnia: Secondary | ICD-10-CM | POA: Diagnosis not present

## 2022-10-27 DIAGNOSIS — I5033 Acute on chronic diastolic (congestive) heart failure: Secondary | ICD-10-CM | POA: Diagnosis not present

## 2022-10-27 DIAGNOSIS — J9601 Acute respiratory failure with hypoxia: Secondary | ICD-10-CM | POA: Diagnosis not present

## 2022-10-27 LAB — CBC WITH DIFFERENTIAL/PLATELET
Abs Immature Granulocytes: 0.08 10*3/uL — ABNORMAL HIGH (ref 0.00–0.07)
Basophils Absolute: 0 10*3/uL (ref 0.0–0.1)
Basophils Relative: 0 %
Eosinophils Absolute: 0.3 10*3/uL (ref 0.0–0.5)
Eosinophils Relative: 3 %
HCT: 36.9 % (ref 36.0–46.0)
Hemoglobin: 11.3 g/dL — ABNORMAL LOW (ref 12.0–15.0)
Immature Granulocytes: 1 %
Lymphocytes Relative: 14 %
Lymphs Abs: 1.9 10*3/uL (ref 0.7–4.0)
MCH: 28.8 pg (ref 26.0–34.0)
MCHC: 30.6 g/dL (ref 30.0–36.0)
MCV: 93.9 fL (ref 80.0–100.0)
Monocytes Absolute: 0.8 10*3/uL (ref 0.1–1.0)
Monocytes Relative: 6 %
Neutro Abs: 10.5 10*3/uL — ABNORMAL HIGH (ref 1.7–7.7)
Neutrophils Relative %: 76 %
Platelets: 273 10*3/uL (ref 150–400)
RBC: 3.93 MIL/uL (ref 3.87–5.11)
RDW: 14.1 % (ref 11.5–15.5)
WBC: 13.6 10*3/uL — ABNORMAL HIGH (ref 4.0–10.5)
nRBC: 0 % (ref 0.0–0.2)

## 2022-10-27 LAB — CULTURE, BLOOD (ROUTINE X 2)
Special Requests: ADEQUATE
Special Requests: ADEQUATE

## 2022-10-27 LAB — MAGNESIUM: Magnesium: 2 mg/dL (ref 1.7–2.4)

## 2022-10-27 LAB — COMPREHENSIVE METABOLIC PANEL
ALT: 14 U/L (ref 0–44)
AST: 18 U/L (ref 15–41)
Albumin: 3.2 g/dL — ABNORMAL LOW (ref 3.5–5.0)
Alkaline Phosphatase: 71 U/L (ref 38–126)
Anion gap: 12 (ref 5–15)
BUN: 12 mg/dL (ref 8–23)
CO2: 28 mmol/L (ref 22–32)
Calcium: 8.3 mg/dL — ABNORMAL LOW (ref 8.9–10.3)
Chloride: 96 mmol/L — ABNORMAL LOW (ref 98–111)
Creatinine, Ser: 0.92 mg/dL (ref 0.44–1.00)
GFR, Estimated: >60 mL/min (ref >60)
Glucose, Bld: 134 mg/dL — ABNORMAL HIGH (ref 70–99)
Potassium: 3.2 mmol/L — ABNORMAL LOW (ref 3.5–5.1)
Sodium: 136 mmol/L (ref 135–145)
Total Bilirubin: 0.4 mg/dL (ref 0.3–1.2)
Total Protein: 6.8 g/dL (ref 6.5–8.1)

## 2022-10-27 LAB — GLUCOSE, CAPILLARY: Glucose-Capillary: 93 mg/dL (ref 70–99)

## 2022-10-27 LAB — PHOSPHORUS: Phosphorus: 5.2 mg/dL — ABNORMAL HIGH (ref 2.5–4.6)

## 2022-10-27 MED ORDER — POTASSIUM CHLORIDE CRYS ER 20 MEQ PO TBCR
EXTENDED_RELEASE_TABLET | ORAL | Status: AC
  Administered 2022-10-27: 40 meq via ORAL
  Filled 2022-10-27: qty 2

## 2022-10-27 MED ORDER — POTASSIUM CHLORIDE CRYS ER 20 MEQ PO TBCR
EXTENDED_RELEASE_TABLET | ORAL | Status: AC
  Filled 2022-10-27: qty 1

## 2022-10-27 MED ORDER — POTASSIUM CHLORIDE CRYS ER 20 MEQ PO TBCR
40.0000 meq | EXTENDED_RELEASE_TABLET | ORAL | Status: AC
  Administered 2022-10-27 (×2): 40 meq via ORAL

## 2022-10-27 MED ORDER — POTASSIUM CHLORIDE CRYS ER 20 MEQ PO TBCR: 40.0000 meq | EXTENDED_RELEASE_TABLET | Freq: Once | ORAL | Status: AC

## 2022-10-27 NOTE — Progress Notes (Addendum)
Dr. Tenny Craw reached out to ensure pt has f/u. TOC HF team scheduled f/u 7/26, will keep. Will arrange gen cards follow-up thereafter in several weeks.

## 2022-10-27 NOTE — Progress Notes (Signed)
Rounding Note    Patient Name: Michele Owens Date of Encounter: 10/27/2022  [facility] Cardiologis  Heart failure team  Last in 2021  Subjective   Breathing continues to improved   walked some   No CP   Inpatient Medications    Scheduled Meds:  allopurinol  100 mg Oral Daily   arformoterol  15 mcg Nebulization BID   aspirin EC  81 mg Oral Daily   azithromycin  500 mg Oral QPM   budesonide (PULMICORT) nebulizer solution  0.25 mg Nebulization BID   buprenorphine-naloxone  2 tablet Sublingual TID   busPIRone  5 mg Oral TID   clonazePAM  2 mg Oral TID   enoxaparin (LOVENOX) injection  60 mg Subcutaneous Q24H   [START ON 10/28/2022] ferrous sulfate  325 mg Oral QODAY   fluticasone  2 spray Each Nare Daily   furosemide  80 mg Intravenous Q12H   guaiFENesin  600 mg Oral BID   levothyroxine  224 mcg Oral QAC breakfast   loratadine  10 mg Oral QHS   losartan  25 mg Oral Daily   pantoprazole  40 mg Oral Daily   potassium chloride  40 mEq Oral Q4H   rifaximin  550 mg Oral TID   rosuvastatin  10 mg Oral QPM   senna-docusate  1 tablet Oral Daily   valACYclovir  1,000 mg Oral Daily   Continuous Infusions:  cefTRIAXone (ROCEPHIN)  IV Stopped (10/26/22 2327)   PRN Meds: acetaminophen **OR** acetaminophen, levalbuterol, loratadine, nitroGLYCERIN, ondansetron **OR** ondansetron (ZOFRAN) IV, polyethylene glycol   Vital Signs    Vitals:   10/26/22 2028 10/26/22 2315 10/26/22 2325 10/27/22 0637  BP:  (!) 91/48  111/66  Pulse:  85  84  Resp:  13 17 17   Temp:  98.1 F (36.7 C)    TempSrc:  Oral  Oral  SpO2: 97% 95%  93%  Weight:    126.6 kg  Height:        Intake/Output Summary (Last 24 hours) at 10/27/2022 0804 Last data filed at 10/26/2022 2327 Gross per 24 hour  Intake 348.17 ml  Output 1600 ml  Net -1251.83 ml   Net neg 3.8 L      10/27/2022    6:37 AM 10/26/2022    5:18 AM 10/25/2022    5:52 AM  Last 3 Weights  Weight (lbs) 279 lb 1.6 oz 280 lb 6.8 oz  276 lb 3.8 oz  Weight (kg) 126.6 kg 127.2 kg 125.3 kg      Telemetry    Sinus rhythm    - Personally Reviewed  ECG    No new  - Personally Reviewed  Physical Exam   GEN: Morbidly obese   In no  acute distress.   Neck: Neck is full  Cardiac: RRR, no murmur Respiratory:  Moving air well  NO wheezes  GI: Obese   MS: No LE edema   Labs    High Sensitivity Troponin:   Recent Labs  Lab 10/02/22 1533 10/02/22 2138 10/23/22 0838 10/23/22 1038  TROPONINIHS 7 7 35* 35*     Chemistry Recent Labs  Lab 10/24/22 0623 10/25/22 0711 10/26/22 0205  NA 136 136 136  K 3.5 4.0 3.5  CL 95* 98 96*  CO2 30 29 31   GLUCOSE 124* 114* 114*  BUN 6* 6* 9  CREATININE 0.77 0.71 0.84  CALCIUM 8.0* 8.1* 8.2*  MG 1.9 1.8  --   PROT 7.1 7.3  --  ALBUMIN 3.4* 3.3*  --   AST 18 16  --   ALT 14 13  --   ALKPHOS 66 64  --   BILITOT 0.7 0.7  --   GFRNONAA >60 >60 >60  ANIONGAP 11 9 9     Lipids No results for input(s): "CHOL", "TRIG", "HDL", "LABVLDL", "LDLCALC", "CHOLHDL" in the last 168 hours.  Hematology Recent Labs  Lab 10/25/22 0711 10/26/22 0205 10/27/22 0017  WBC 13.8* 13.5* 13.6*  RBC 4.11 3.88 3.93  HGB 12.2 11.6* 11.3*  HCT 39.0 37.2 36.9  MCV 94.9 95.9 93.9  MCH 29.7 29.9 28.8  MCHC 31.3 31.2 30.6  RDW 14.4 14.3 14.1  PLT 259 281 273   Thyroid  Recent Labs  Lab 10/24/22 0623  TSH 2.301    BNP Recent Labs  Lab 10/23/22 0838 10/24/22 0623  BNP 130.6* 38.8    DDimer No results for input(s): "DDIMER" in the last 168 hours.   Radiology    No results found.  Cardiac Studies    Echo  Feb 2024   1. Left ventricular ejection fraction, by estimation, is 55 to 60%. The  left ventricle has normal function. The left ventricle has no regional  wall motion abnormalities. There is mild concentric left ventricular  hypertrophy. Left ventricular diastolic  parameters are consistent with Grade I diastolic dysfunction (impaired  relaxation).   2. Very mildly  D-shaped interventricular septum suggestive of RV  pressure/volume overload. Right ventricular systolic function is normal.  The right ventricular size is normal.   3. Right atrial size was mildly dilated.   4. The mitral valve is normal in structure. Trivial mitral valve  regurgitation. No evidence of mitral stenosis.   5. The aortic valve is tricuspid. Aortic valve regurgitation is not  visualized. No aortic stenosis is present.   6. IVC not visualized. Peak RV-RA gradient 17 mmHg.   7. Technically difficult study with poor acoustic windows.     Patient Profile     Michele Owens is a 62 y.o. female with a hx of  HFpEF who is being seen 10/23/2022 for the evaluation of SOB and edema at the request of Dr Marland Mcalpine.   Assessment & Plan    # HFpEF   Acute on chronic    In setting of severe COPD   Pt also ran out of meds about 1 week before adimt  Pt has diuresed 3.8 L   Weight has not gone down though Clinically is much improved   Breathing much better    Losartan added     With her COPD and body habitus Jardiance may be difficult  REcomm:  Keep on IV lasix today   Switch back to torsemide 20 bid tomorrow to go home   Would give 20 KCL With body habitus and limited mobility I have not recomm Jardiance   Weigh daily in am.  Limit salt   Fluids at 1500 cc  Will arrange for outpt follow up in general cardiology clinic   # Severe COPD  and asthma   Continue O2 [place]  Moving air better     # HTN   BP remains well controlled   #HL  Continue statin   Will be available as needed   Please call   Otherwise will sign off  For questions or updates, please contact [facility] Please consult [email or web address] for contact info under        Signed, Dietrich Pates, MD  10/27/2022, 8:04 AM

## 2022-10-27 NOTE — Plan of Care (Signed)
Problem: Education: Goal: Ability to demonstrate management of disease process will improve 10/27/2022 2036 by Brooke Bonito, RN Outcome: Progressing 10/27/2022 2035 by Brooke Bonito, RN Outcome: Progressing Goal: Ability to verbalize understanding of medication therapies will improve 10/27/2022 2036 by Brooke Bonito, RN Outcome: Progressing 10/27/2022 2035 by Brooke Bonito, RN Outcome: Progressing Goal: Individualized Educational Video(s) 10/27/2022 2036 by Brooke Bonito, RN Outcome: Progressing 10/27/2022 2035 by Brooke Bonito, RN Outcome: Progressing   Problem: Activity: Goal: Capacity to carry out activities will improve 10/27/2022 2036 by Brooke Bonito, RN Outcome: Progressing 10/27/2022 2035 by Brooke Bonito, RN Outcome: Progressing   Problem: Cardiac: Goal: Ability to achieve and maintain adequate cardiopulmonary perfusion will improve 10/27/2022 2036 by Brooke Bonito, RN Outcome: Progressing 10/27/2022 2035 by Brooke Bonito, RN Outcome: Progressing   Problem: Education: Goal: Knowledge of General Education information will improve Description: Including pain rating scale, medication(s)/side effects and non-pharmacologic comfort measures 10/27/2022 2036 by Brooke Bonito, RN Outcome: Progressing 10/27/2022 2035 by Brooke Bonito, RN Outcome: Progressing   Problem: Health Behavior/Discharge Planning: Goal: Ability to manage health-related needs will improve 10/27/2022 2036 by Brooke Bonito, RN Outcome: Progressing 10/27/2022 2035 by Brooke Bonito, RN Outcome: Progressing   Problem: Clinical Measurements: Goal: Ability to maintain clinical measurements within normal limits will improve 10/27/2022 2036 by Brooke Bonito, RN Outcome: Progressing 10/27/2022 2035 by Brooke Bonito, RN Outcome: Progressing Goal: Will remain free from infection 10/27/2022 2036 by Brooke Bonito, RN Outcome: Progressing 10/27/2022 2035 by Brooke Bonito, RN Outcome: Progressing Goal: Diagnostic test results will improve 10/27/2022 2036 by Brooke Bonito, RN Outcome: Progressing 10/27/2022 2035 by Brooke Bonito, RN Outcome: Progressing Goal: Respiratory complications will improve 10/27/2022 2036 by Brooke Bonito, RN Outcome: Progressing 10/27/2022 2035 by Brooke Bonito, RN Outcome: Progressing Goal: Cardiovascular complication will be avoided 10/27/2022 2036 by Brooke Bonito, RN Outcome: Progressing 10/27/2022 2035 by Brooke Bonito, RN Outcome: Progressing   Problem: Activity: Goal: Risk for activity intolerance will decrease 10/27/2022 2036 by Brooke Bonito, RN Outcome: Progressing 10/27/2022 2035 by Brooke Bonito, RN Outcome: Progressing   Problem: Nutrition: Goal: Adequate nutrition will be maintained 10/27/2022 2036 by Brooke Bonito, RN Outcome: Progressing 10/27/2022 2035 by Brooke Bonito, RN Outcome: Progressing   Problem: Coping: Goal: Level of anxiety will decrease 10/27/2022 2036 by Brooke Bonito, RN Outcome: Progressing 10/27/2022 2035 by Brooke Bonito, RN Outcome: Progressing   Problem: Elimination: Goal: Will not experience complications related to bowel motility 10/27/2022 2036 by Brooke Bonito, RN Outcome: Progressing 10/27/2022 2035 by Brooke Bonito, RN Outcome: Progressing Goal: Will not experience complications related to urinary retention 10/27/2022 2036 by Brooke Bonito, RN Outcome: Progressing 10/27/2022 2035 by Brooke Bonito, RN Outcome: Progressing   Problem: Pain Managment: Goal: General experience of comfort will improve 10/27/2022 2036 by Brooke Bonito, RN Outcome: Progressing 10/27/2022 2035 by Brooke Bonito, RN Outcome: Progressing   Problem: Safety: Goal: Ability to remain free from injury will improve 10/27/2022 2036 by Brooke Bonito, RN Outcome: Progressing 10/27/2022 2035 by Brooke Bonito, RN Outcome: Progressing   Problem:  Skin Integrity: Goal: Risk for impaired skin integrity will decrease 10/27/2022 2036 by Brooke Bonito, RN Outcome: Progressing 10/27/2022 2035 by Brooke Bonito, RN Outcome: Progressing   Problem: Education: Goal: Knowledge of General Education information will improve Description: Including pain rating scale, medication(s)/side  effects and non-pharmacologic comfort measures Outcome: Progressing   Problem: Health Behavior/Discharge Planning: Goal: Ability to manage health-related needs will improve Outcome: Progressing   Problem: Clinical Measurements: Goal: Ability to maintain clinical measurements within normal limits will improve Outcome: Progressing Goal: Will remain free from infection Outcome: Progressing Goal: Diagnostic test results will improve Outcome: Progressing Goal: Respiratory complications will improve Outcome: Progressing Goal: Cardiovascular complication will be avoided Outcome: Progressing   Problem: Activity: Goal: Risk for activity intolerance will decrease Outcome: Progressing   Problem: Nutrition: Goal: Adequate nutrition will be maintained Outcome: Progressing   Problem: Coping: Goal: Level of anxiety will decrease Outcome: Progressing   Problem: Elimination: Goal: Will not experience complications related to bowel motility Outcome: Progressing Goal: Will not experience complications related to urinary retention Outcome: Progressing   Problem: Pain Managment: Goal: General experience of comfort will improve Outcome: Progressing   Problem: Safety: Goal: Ability to remain free from injury will improve Outcome: Progressing   Problem: Skin Integrity: Goal: Risk for impaired skin integrity will decrease Outcome: Progressing

## 2022-10-27 NOTE — Plan of Care (Signed)
  Problem: Clinical Measurements: Goal: Ability to maintain clinical measurements within normal limits will improve Outcome: Progressing Goal: Will remain free from infection Outcome: Progressing   Problem: Activity: Goal: Risk for activity intolerance will decrease Outcome: Progressing   Problem: Education: Goal: Ability to demonstrate management of disease process will improve Outcome: Not Progressing   Problem: Activity: Goal: Capacity to carry out activities will improve Outcome: Not Progressing
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
## 2022-10-27 NOTE — Plan of Care (Signed)
°  Problem: Education: °Goal: Ability to demonstrate management of disease process will improve °Outcome: Progressing °Goal: Ability to verbalize understanding of medication therapies will improve °Outcome: Progressing °Goal: Individualized Educational Video(s) °Outcome: Progressing °  °

## 2022-10-28 ENCOUNTER — Other Ambulatory Visit (HOSPITAL_COMMUNITY): Payer: Self-pay

## 2022-10-28 ENCOUNTER — Encounter (HOSPITAL_COMMUNITY): Payer: Self-pay | Admitting: Internal Medicine

## 2022-10-28 DIAGNOSIS — I5033 Acute on chronic diastolic (congestive) heart failure: Secondary | ICD-10-CM | POA: Diagnosis not present

## 2022-10-28 LAB — CBC
HCT: 41.3 % (ref 36.0–46.0)
Hemoglobin: 13.2 g/dL (ref 12.0–15.0)
MCH: 30.5 pg (ref 26.0–34.0)
MCHC: 32 g/dL (ref 30.0–36.0)
MCV: 95.4 fL (ref 80.0–100.0)
Platelets: 289 10*3/uL (ref 150–400)
RBC: 4.33 MIL/uL (ref 3.87–5.11)
RDW: 13.9 % (ref 11.5–15.5)
WBC: 12.9 10*3/uL — ABNORMAL HIGH (ref 4.0–10.5)
nRBC: 0 % (ref 0.0–0.2)

## 2022-10-28 LAB — COMPREHENSIVE METABOLIC PANEL
ALT: 19 U/L (ref 0–44)
AST: 29 U/L (ref 15–41)
Albumin: 3.9 g/dL (ref 3.5–5.0)
Alkaline Phosphatase: 75 U/L (ref 38–126)
Anion gap: 17 — ABNORMAL HIGH (ref 5–15)
BUN: 11 mg/dL (ref 8–23)
CO2: 25 mmol/L (ref 22–32)
Calcium: 9.1 mg/dL (ref 8.9–10.3)
Chloride: 92 mmol/L — ABNORMAL LOW (ref 98–111)
Creatinine, Ser: 0.83 mg/dL (ref 0.44–1.00)
GFR, Estimated: >60 mL/min (ref >60)
Glucose, Bld: 134 mg/dL — ABNORMAL HIGH (ref 70–99)
Potassium: 3.6 mmol/L (ref 3.5–5.1)
Sodium: 134 mmol/L — ABNORMAL LOW (ref 135–145)
Total Bilirubin: 0.5 mg/dL (ref 0.3–1.2)
Total Protein: 8.2 g/dL — ABNORMAL HIGH (ref 6.5–8.1)

## 2022-10-28 LAB — PHOSPHORUS: Phosphorus: 3.6 mg/dL (ref 2.5–4.6)

## 2022-10-28 LAB — CULTURE, BLOOD (ROUTINE X 2)
Culture: NO GROWTH
Culture: NO GROWTH

## 2022-10-28 MED ORDER — LEVOTHYROXINE SODIUM 112 MCG PO TABS: 224.0000 ug | ORAL_TABLET | Freq: Every day | ORAL | 1 refills | Status: DC

## 2022-10-28 MED ORDER — BUDESONIDE 0.25 MG/2ML IN SUSP
0.2500 mg | Freq: Two times a day (BID) | RESPIRATORY_TRACT | 12 refills | Status: DC
  Filled 2022-10-28: qty 60, 15d supply, fill #0

## 2022-10-28 MED ORDER — LOSARTAN POTASSIUM 25 MG PO TABS
25.0000 mg | ORAL_TABLET | Freq: Every day | ORAL | 1 refills | Status: DC
  Filled 2022-10-28: qty 30, 30d supply, fill #0

## 2022-10-28 MED ORDER — CARVEDILOL 3.125 MG PO TABS: 3.1250 mg | ORAL_TABLET | Freq: Two times a day (BID) | ORAL | 1 refills | Status: DC

## 2022-10-28 MED ORDER — SENNOSIDES-DOCUSATE SODIUM 8.6-50 MG PO TABS: 1.0000 | ORAL_TABLET | Freq: Every day | ORAL | 1 refills | Status: DC

## 2022-10-28 MED ORDER — GUAIFENESIN ER 600 MG PO TB12
600.0000 mg | ORAL_TABLET | Freq: Two times a day (BID) | ORAL | 0 refills | Status: AC
  Filled 2022-10-28: qty 20, 10d supply, fill #0

## 2022-10-28 MED ORDER — TORSEMIDE 20 MG PO TABS
20.0000 mg | ORAL_TABLET | Freq: Two times a day (BID) | ORAL | 0 refills | Status: DC
  Filled 2022-10-28: qty 60, 30d supply, fill #0

## 2022-10-28 MED ORDER — ROSUVASTATIN CALCIUM 10 MG PO TABS: 10.0000 mg | ORAL_TABLET | Freq: Every evening | ORAL | 1 refills | Status: DC

## 2022-10-28 NOTE — TOC Transition Note (Signed)
Transition of Care [facility]) - CM/SW Discharge Note   Patient Details  Name: Michele Owens MRN: [id number] Date of Birth: 09-02-1960  Transition [facility] Care [facility] CM/SW Contact:  Lawerance Sabal, RN Phone Number: 10/28/2022, 10:53 AM   Clinical Narrative:     Spoke to patient over the phone. She requested a rollator, stated that her transportation is on the way and she will be rolled down to the entrance once her meds are filled by [facility] pharmacy, nurse said she was going to check in 10  minutes. We spoke that the rollator would not be delivered to her room that quickly so she decided it would be better to be delivered to her house. Referral made to [facility] for it to be delivered to her home, address verified.  No other toc needs identified.   Final next level of care: Home/Self Care Barriers to Discharge: No Barriers Identified   Patient Goals and CMS Choice   Choice offered to / list presented to : NA  Discharge Placement                         Discharge Plan and Services Additional resources added to the After Visit Summary for   In-house Referral: NA Discharge Planning Services: CM Consult Post Acute Care Choice: NA          DME Arranged: Walker rolling with seat DME Agency: [employer] Date DME Agency Contacted: 10/28/22 Time DME Agency Contacted: 1053 Representative spoke with at DME Agency: Vaughan Basta HH Arranged: NA          Social Determinants of Health (SDOH) Interventions SDOH Screenings   Food Insecurity: No Food Insecurity (10/23/2022)  Housing: Low Risk  (10/24/2022)  Transportation Needs: No Transportation Needs (10/23/2022)  Utilities: Not At Risk (10/23/2022)  Alcohol Screen: Low Risk  (10/24/2022)  Depression (PHQ2-9): Low Risk  (03/21/2021)  Financial Resource Strain: Low Risk  (10/24/2022)  Physical Activity: Inactive (02/01/2021)  Social Connections: Unknown (08/21/2021)   Received from [facility]  Stress: Stress Concern Present (02/23/2021)   Tobacco Use: Medium Risk (10/23/2022)     Readmission Risk Interventions    07/09/2022   10:29 AM 05/18/2022   11:29 AM  Readmission Risk Prevention Plan  Transportation Screening Complete Complete  PCP or Specialist Appt within 3-5 Days Complete   HRI or Home Care Consult Complete Complete  Social Work Consult for Recovery Care Planning/Counseling Complete Complete  Palliative Care Screening Not Applicable Complete  Medication Review Oceanographer) Not Complete Referral to Pharmacy

## 2022-10-28 NOTE — Progress Notes (Signed)
Patient at bedside stating that she is going home today. Patient stated that she is leaving this morning as soon as her ride gets here. She stated " I should've been discharged yesterday". She wants me to contact the pharmacy to get her medications. She states " I'm not catching no city bus" C/o that IV in her arm is swollen and need to be taken out. MD informed.   Patient informed per MD, doctor will be coming to round and discharge her. Labs ordered. Phlebotomist at bedside at this time.

## 2022-10-28 NOTE — Discharge Summary (Signed)
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 38.8   Basic Metabolic Panel: Recent Labs  Lab 10/24/22 0623 10/25/22 0711 10/26/22 0205 10/27/22 0017 10/28/22 0828  NA 136 136 136 136 134*  K 3.5 4.0 3.5 3.2* 3.6  CL 95* 98 96* 96* 92*  CO2 30 29 31 28 25   GLUCOSE 124* 114* 114* 134* 134*  BUN 6* 6* 9 12 11   CREATININE 0.77 0.71 0.84 0.92 0.83  CALCIUM 8.0* 8.1* 8.2* 8.3* 9.1  MG 1.9 1.8  --  2.0  --   PHOS 2.9 3.5  --  5.2* 3.6   Liver Function Tests: Recent Labs  Lab 10/24/22 0623 10/25/22 0711 10/27/22 0017 10/28/22 0828  AST 18 16 18 29   ALT 14 13 14 19   ALKPHOS 66 64 71 75   BILITOT 0.7 0.7 0.4 0.5  PROT 7.1 7.3 6.8 8.2*  ALBUMIN 3.4* 3.3* 3.2* 3.9   No results for input(s): "LIPASE", "AMYLASE" in the last 168 hours. Recent Labs  Lab 10/23/22 2145  AMMONIA 30   CBC: Recent Labs  Lab 10/24/22 0623 10/25/22 0711 10/26/22 0205 10/27/22 0017 10/28/22 0828  WBC 18.6* 13.8* 13.5* 13.6* 12.9*  NEUTROABS 15.2* 11.4*  --  10.5*  --   HGB 10.8* 12.2 11.6* 11.3* 13.2  HCT 34.8* 39.0 37.2 36.9 41.3  MCV 96.4 94.9 95.9 93.9 95.4  PLT 248 259 281 273 289   Cardiac Enzymes: No results for input(s): "CKTOTAL", "CKMB", "CKMBINDEX", "TROPONINI" in the last 168 hours. BNP: Invalid input(s): "POCBNP" CBG: Recent Labs  Lab 10/23/22 1433 10/24/22 0751 10/25/22 0549 10/26/22 0513 10/27/22 0559  GLUCAP  121* 111* 130* 101* 93   D-Dimer No results for input(s): "DDIMER" in the last 72 hours. Hgb A1c No results for input(s): "HGBA1C" in the last 72 hours. Lipid Profile No results for input(s): "CHOL", "HDL", "LDLCALC", "TRIG", "CHOLHDL", "LDLDIRECT" in the last 72 hours. Thyroid function studies No results for input(s): "TSH", "T4TOTAL", "T3FREE", "THYROIDAB" in the last 72 hours.  Invalid input(s): "FREET3" Anemia work up No results for input(s): "VITAMINB12", "FOLATE", "FERRITIN", "TIBC", "IRON", "RETICCTPCT" in the last 72 hours. Urinalysis    Component Value Date/Time   COLORURINE YELLOW 10/23/2022 2004   APPEARANCEUR CLEAR 10/23/2022 2004   LABSPEC 1.008 10/23/2022 2004   PHURINE 7.0 10/23/2022 2004   GLUCOSEU NEGATIVE 10/23/2022 2004   HGBUR NEGATIVE 10/23/2022 2004   BILIRUBINUR NEGATIVE 10/23/2022 2004   KETONESUR NEGATIVE 10/23/2022 2004   PROTEINUR NEGATIVE 10/23/2022 2004   UROBILINOGEN 0.2 02/28/2012 2004   NITRITE NEGATIVE 10/23/2022 2004   LEUKOCYTESUR NEGATIVE 10/23/2022 2004   Sepsis Labs Recent Labs  Lab 10/25/22 0711 10/26/22 0205 10/27/22 0017 10/28/22 0828  WBC 13.8* 13.5* 13.6* 12.9*   Microbiology Recent Results  (from the past 240 hour(s))  SARS Coronavirus 2 by RT PCR (hospital order, performed in [facility] hospital lab) *cepheid single result test* Anterior Nasal Swab     Status: None   Collection Time: 10/23/22  9:53 AM   Specimen: Anterior Nasal Swab  Result Value Ref Range Status   SARS Coronavirus 2 by RT PCR NEGATIVE NEGATIVE Final    Comment: Performed at [facility], [address], [place], [place] [address]  Blood culture (routine x 2)     Status: None   Collection Time: 10/23/22  4:15 PM   Specimen: BLOOD LEFT ARM  Result Value Ref Range Status   Specimen Description BLOOD LEFT ARM  Final   Special Requests   Final    BOTTLES DRAWN AEROBIC AND ANAEROBIC Blood Culture adequate volume   Culture   Final    NO GROWTH 5 DAYS Performed at [facility], [address] [place], [place] [address]    Report Status 10/28/2022 FINAL  Final  Blood culture (routine x 2)     Status: None   Collection Time: 10/23/22  5:16 PM   Specimen: BLOOD  Result Value Ref Range Status   Specimen Description BLOOD SITE NOT SPECIFIED  Final   Special Requests   Final    BOTTLES DRAWN AEROBIC ONLY Blood Culture adequate volume   Culture   Final    NO GROWTH 5 DAYS Performed at [facility], [address] [place], [place] [address]    Report Status 10/28/2022 FINAL  Final     Time coordinating discharge: Over 30 minutes  SIGNED:   Willeen Niece, MD  [facility] 10/28/2022, 12:29 PM Pager   If 7PM-7AM, please contact night-coverage

## 2022-10-28 NOTE — Discharge Instructions (Signed)
Advised to follow-up with primary care physician in 1 week. Advised to follow-up with cardiology as scheduled. Advised to take torsemide 20 mg twice daily. Advised to start losartan 25 mg daily.

## 2022-11-02 ENCOUNTER — Telehealth (HOSPITAL_COMMUNITY): Payer: Self-pay | Admitting: Licensed Clinical Social Worker

## 2022-11-02 NOTE — Progress Notes (Signed)
HEART & VASCULAR TRANSITION OF CARE CONSULT NOTE     Referring Physician:Dr Khatri Primary Care: Dr August Luz  Primary Cardiologist: Dr Shirlee Latch   HPI: Referred to clinic by Dr Idelle Leech for heart failure consultation.   Michele Owens is a 62 year old with a history of  HTN, obesity, OSA, COPD, hyperlipidemia, diastolic CHF, gout, bipolar, and SVT.     Admitted in 8/16 with diastolic CHF and COPD exacerbation.  She was admitted in 1/17 with COPD exacerbation.  She was set up for a Cardiolite in 2/17.  This showed a small, moderate intensity partially reversible inferolateral defect.  LHC/RHC in 3/17 showed no significant CAD, mildly elevated PCWP.  PFTs in 3/17 showed severe COPD.     Admitted 10/15-10/19/17 with AECOPD and A/C diastolic CHF.  Diuresed with IV lasix. Completed course for CAP. Pt also noted have multinodular goiter on [place].  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    Echo in 4/18 showed EF 45-50%, moderate dilated RV with moderately decreased systolic function.    She was admitted again in 4/18 with COPD exacerbation, acute on chronic diastolic CHF, and CAP.     She was admitted in 3/20 with acute on chronic diastolic CHF and treated with IV Lasix.  Echo showed EF 60-65%, mild RV enlargement with normal function, respirophasic septal variation.    She has been in the ED 07/11/19, 07/08/19, 07/05/19, 06/28/19, 06/25/19, 06/23/19 for abdominal pain/chest pain. CT abdomen was stable. Given IV lasix on a couple occasions.    Had f/u in April and was felt to be mildly volume overloaded w/ poor response to PO Lasix. Diuretic was changed to torsemide 60 mg daily. Referral was also placed to pulmonology.    She had return f/u 5/21. Noted poor response to torsemide. Wt was trending up. She also endorsed increased abdominal distention and LEE. Repeat 2D echo showed normal LVEF 55-60% (lower than prior study, 60-65%). RV normal. RVSP was normal. [facility] w/ instructions to treat w/ 80 mg IV x 3 days, then return to torsemide 60 mg daily.   6 ED visits and 4 admits for A/C HFpEF. last 6 months for heart failure.   Overall feeling ok. Frustrated with her daughter breaking in her apartment. Remains SOB with exertion. ON 2 liters [place] chronically. Denies PND/Orthopnea. Appetite ok. No fever or chills. Tries to weight every now and then. Taking all medications. She brought medications to the appointment. Lives in low income housing.    Review of Systems: [y] = yes, [ ]  = no   General: Weight gain [ Y]; Weight loss [ ] ; Anorexia [ ] ; Fatigue [ ] ; Fever [ ] ; Chills [ ] ; Weakness [ Y]  Cardiac: Chest pain/pressure [ ] ; Resting SOB [ ] ; Exertional SOB [ Y]; Orthopnea [ ] ; Pedal Edema [ ] ; Palpitations [ ] ; Syncope [ ] ; Presyncope [ ] ; Paroxysmal nocturnal dyspnea[ ]   Pulmonary: Cough [ ] ; Wheezing[ ] ; Hemoptysis[ ] ; Sputum [ ] ; Snoring [ ]   GI: Vomiting[ ] ; Dysphagia[ ] ; Melena[ ] ; Hematochezia [ ] ; Heartburn[ ] ; Abdominal pain [ ] ; Constipation [ ] ; Diarrhea [ ] ; BRBPR [ ]   GU: Hematuria[ ] ; Dysuria [ ] ; Nocturia[ ]   Vascular: Pain in legs with walking [ ] ; Pain in feet with lying flat [ ] ; Non-healing sores [ ] ; Stroke [ ] ; TIA [ ] ; Slurred speech [ ] ;  Neuro: Headaches[ ] ; Vertigo[ ] ; Seizures[ ] ;  Paresthesias[ ] ;Blurred vision [ ] ; Diplopia [ ] ; Vision changes [ ]   Ortho/Skin: Arthritis [ ] ; Joint pain [ Y]; Muscle pain [ ] ; Joint swelling [ ] ; Back Pain [ Y]; Rash [ ]   Psych: Depression[ ] ; Anxiety[ Y]  Heme: Bleeding problems [ ] ; Clotting disorders [ ] ; Anemia [ ]   Endocrine: Diabetes [ Y]; Thyroid dysfunction[ ]    Past Medical History:  Diagnosis Date   Anxiety    Arthritis    Asthma    Chronic diastolic CHF (congestive heart failure) (HCC)    COPD (chronic obstructive pulmonary disease) (HCC)    Uses Oxygen at night   Depression    Diabetes mellitus without complication (HCC)    GERD (gastroesophageal reflux  disease)    Gout    Headache    migraines   History of DVT of lower extremity    Hypertension    Morbid obesity with BMI of 45.0-49.9, adult (HCC)    Thyroid cancer (HCC) 09/23/2016    Current Outpatient Medications  Medication Sig Dispense Refill   acetaminophen (TYLENOL) 500 MG tablet Take 1,000 mg by mouth every 6 (six) hours as needed for moderate pain or headache.     albuterol (PROVENTIL HFA;VENTOLIN HFA) 108 (90 Base) MCG/ACT inhaler Inhale 1-2 puffs into the lungs every 6 (six) hours as needed for wheezing or shortness of breath. 1 Inhaler 0   allopurinol (ZYLOPRIM) 100 MG tablet Take 100 mg by mouth daily.     aspirin EC 81 MG tablet Take 81 mg by mouth daily. Swallow whole.     benzonatate (TESSALON) 200 MG capsule Take 200 mg by mouth 3 (three) times daily.     blood glucose meter kit and supplies KIT Dispense based on patient and insurance preference. Use up to four times daily as directed. 1 each 0   budesonide (PULMICORT) 0.25 MG/2ML nebulizer solution Inhale 2 mLs (0.25 mg total) by nebulization 2 (two) times daily. 60 mL 12   busPIRone (BUSPAR) 5 MG tablet Take 5 mg by mouth 3 (three) times daily.     butalbital-acetaminophen-caffeine (FIORICET) 50-325-40 MG tablet Take 1 tablet by mouth 2 (two) times daily as needed for headache or migraine.     calcitRIOL (ROCALTROL) 0.5 MCG capsule Take 1 capsule (0.5 mcg total) by mouth daily. 30 capsule 0   carvedilol (COREG) 3.125 MG tablet Take 1 tablet (3.125 mg total) by mouth 2 (two) times daily with a meal. 60 tablet 1   clonazePAM (KLONOPIN) 2 MG tablet Take 1 tablet (2 mg total) by mouth 3 (three) times daily. for anxiety (Patient taking differently: Take 2 mg by mouth 3 (three) times daily.) 10 tablet 0   cyclobenzaprine (FLEXERIL) 10 MG tablet Take 10 mg by mouth 3 (three) times daily as needed for muscle spasms.     EPINEPHrine 0.3 mg/0.3 mL IJ SOAJ injection Inject 0.3 mg into the muscle once as needed for anaphylaxis.      fenofibrate (TRICOR) 145 MG tablet Take 145 mg by mouth daily.     FEROSUL 325 (65 Fe) MG tablet Take 325 mg by mouth daily.  3   guaiFENesin (MUCINEX) 600 MG 12 hr tablet Take 1 tablet (600 mg total) by mouth 2 (two) times daily for 10 days. 20 tablet 0   levocetirizine (XYZAL) 5 MG tablet Take 5 mg by mouth at bedtime.     levothyroxine (SYNTHROID) 112 MCG tablet Take 2 tablets (224 mcg total) by mouth daily before breakfast. 30 tablet 1  lidocaine (LIDODERM) 5 % Place 1 patch onto the skin daily. Remove & Discard patch within 12 hours or as directed by MD 30 patch 0   loperamide (IMODIUM) 2 MG capsule Take 1 capsule (2 mg total) by mouth 4 (four) times daily as needed for diarrhea or loose stools. 12 capsule 0   loratadine (CLARITIN) 10 MG tablet Take 10 mg by mouth daily as needed for allergies.     losartan (COZAAR) 25 MG tablet Take 1 tablet (25 mg total) by mouth daily. 30 tablet 1   nitroGLYCERIN (NITROSTAT) 0.4 MG SL tablet Place 1 tablet (0.4 mg total) under the tongue every 5 (five) minutes as needed for chest pain. 12 tablet 0   nystatin cream (MYCOSTATIN) Apply 1 Application topically 2 (two) times daily as needed for dry skin.     omeprazole (PRILOSEC) 20 MG capsule Take 20 mg by mouth 2 (two) times daily as needed (acid reflux).     ondansetron (ZOFRAN) 4 MG tablet Take 4 mg by mouth every 8 (eight) hours as needed for nausea or vomiting.     oxyCODONE-acetaminophen (PERCOCET) 10-325 MG tablet Take 1 tablet by mouth every 6 (six) hours as needed for pain.     OXYGEN Inhale 3 L into the lungs continuous.     phentermine (ADIPEX-P) 37.5 MG tablet Take 37.5 mg by mouth daily.     polyethylene glycol (MIRALAX / GLYCOLAX) 17 g packet Take 17 g by mouth daily as needed for mild constipation. 14 each 0   potassium chloride SA (KLOR-CON M) 20 MEQ tablet Take 2 tablets (40 mEq total) by mouth daily. 60 tablet 3   rosuvastatin (CRESTOR) 10 MG tablet Take 1 tablet (10 mg total) by mouth every  evening. 30 tablet 1   senna-docusate (SENOKOT-S) 8.6-50 MG tablet Take 1 tablet by mouth daily. 30 tablet 1   SUBOXONE 4-1 MG FILM Place 0.5-1 Film under the tongue 3 (three) times daily as needed (Withdrawal).     torsemide (DEMADEX) 20 MG tablet Take 1 tablet (20 mg total) by mouth 2 (two) times daily. 60 tablet 0   traZODone (DESYREL) 50 MG tablet Take 100 mg by mouth at bedtime.     umeclidinium-vilanterol (ANORO ELLIPTA) 62.5-25 MCG/INH AEPB Inhale 1 puff into the lungs daily.     valACYclovir (VALTREX) 1000 MG tablet Take 1,000 mg by mouth daily.     XIFAXAN 550 MG TABS tablet Take 550 mg by mouth 3 (three) times daily.     No current facility-administered medications for this encounter.    Allergies  Allergen Reactions   Bee Venom Swelling and Other (See Comments)    "All over my body" (swelling)   Fioricet [Butalbital-Apap-Caffeine] Nausea And Vomiting and Rash   Ibuprofen Rash and Other (See Comments)    Severe rash   Lamisil [Terbinafine] Rash and Other (See Comments)    Pt states this causes her to "feel funny"   Nsaids Other (See Comments)    Per MD's orders       Social History   Socioeconomic History   Marital status: Single    Spouse name: Not on file   Number of children: 1   Years of education: Not on file   Highest education level: Not on file  Occupational History   Occupation: disabled  Tobacco Use   Smoking status: Former    Current packs/day: 0.00    Average packs/day: 0.5 packs/day for 41.0 years (20.5 ttl pk-yrs)  Types: Cigarettes    Start date: 07/1975    Quit date: 07/2016    Years since quitting: 6.3   Smokeless tobacco: Never  Vaping Use   Vaping status: Never Used  Substance and Sexual Activity   Alcohol use: Not Currently    Comment: quit 12 years ago   Drug use: Not Currently    Types: Marijuana, Cocaine    Comment: quit 12 years ago   Sexual activity: Yes  Other Topics Concern   Not on file  Social History Narrative   Not on  file   Social Determinants of Health   Financial Resource Strain: Low Risk  (10/24/2022)   Overall Financial Resource Strain (CARDIA)    Difficulty of Paying Living Expenses: Not very hard  Food Insecurity: No Food Insecurity (10/23/2022)   Hunger Vital Sign    Worried About Running Out of Food in the Last Year: Never true    Ran Out of Food in the Last Year: Never true  Transportation Needs: Unmet Transportation Needs (11/02/2022)   PRAPARE - Administrator, Civil Service (Medical): Yes    Lack of Transportation (Non-Medical): No  Physical Activity: Inactive (02/01/2021)   Exercise Vital Sign    Days of Exercise per Week: 0 days    Minutes of Exercise per Session: 0 min  Stress: Stress Concern Present (02/23/2021)   [employer] of Occupational Health - Occupational Stress Questionnaire    Feeling of Stress : To some extent  Social Connections: Unknown (08/21/2021)   [facility] from [facility]   Social Network    Social Network: Not on file  Intimate Partner Violence: Not At Risk (10/23/2022)   Humiliation, Afraid, Rape, and Kick questionnaire    Fear of Current or Ex-Partner: No    Emotionally Abused: No    Physically Abused: No    Sexually Abused: No      Family History  Problem Relation Age of Onset   Cancer Father        thought to be due to exposure to concrete   Diabetes Mother    Thyroid cancer Mother        dx in her 24s-60s   Cancer Maternal Uncle        2 uncles with cancer NOS   Brain cancer Paternal Aunt    Cancer Cousin        maternal first cousin - NOS   Cancer Cousin        maternal first cousin - NOS    Vitals:   11/03/22 1435  BP: (!) 142/80  Pulse: (!) 101  SpO2: 100%  Weight: 128.7 kg (283 lb 12.8 oz)   Wt Readings from Last 3 Encounters:  11/03/22 128.7 kg (283 lb 12.8 oz)  10/28/22 127 kg (279 lb 15.8 oz)  07/09/22 124.4 kg (274 lb 4 oz)    PHYSICAL EXAM: General:  Walked in the clinic with oxygen. No respiratory  difficulty HEENT: normal Neck: supple. JVP 8-9  Carotids 2+ bilat; no bruits. No lymphadenopathy or thryomegaly appreciated. Cor: PMI nondisplaced. Regular rate & rhythm. No rubs, gallops or murmurs. Lungs: clear on 2 liters [place].  Abdomen: obese, soft, nontender, nondistended. No hepatosplenomegaly. No bruits or masses. Good bowel sounds. Extremities: no cyanosis, clubbing, rash, trace-1+ lower extremity edema Neuro: alert & oriented x 3, cranial nerves grossly intact. moves all 4 extremities w/o difficulty. Affect pleasant.  ECG: SR 92 bpm personally checked   ASSESSMENT & PLAN: 1. Chronic HFpEF Echo  05/2022  EF 55-60% D shaped septum, Grade IDD.  NYHA III. Volume status elevated.  GDMT  Diuretic-Increase torsemide 40 mg twice a day.  BB-Continue current dose coreg.  Ace/ARB/ARNI- Continue current dose of losartan next visit consider enetresto  MRA- Add 25 mg spironolactone dialy  SGLT2i- No candidiasis under pannus/frequent skin abscess Discussed low salt food choices and limiting fluid to < 2 liters per day.   2. HTN  Mildly elevated. Adding spironolactone 25 mg daily.   3. Obesity  Body mass index is 47.23 kg/m. Discussed portion control.   4. Chronic Respiratory Failure  On 2 liters  sats stable.    Referred to HFSW (PCP, Financial ): Yes  Refer to Pharmacy:  No Refer to Home Health:  No Refer to [facility]: Yes Dr Shirlee Latch needs to re establish with him.  Refer to General Cardiology: Yes or No  Follow up 2 weeks to reassess volume status. Could consider cardiomems? Check BMET and BNP today.    Michele Higham NP-C  3:18 PM

## 2022-11-02 NOTE — Telephone Encounter (Signed)
H&V Care Navigation CSW Progress Note  Clinical Social Worker consulted to arrange transport to pt appt tomorrow.  Able to set up taxi through [employer].  Pt has Managed medicaid but requested wrong date and now too close to appt.  Patient is participating in a Managed Medicaid Plan:  Yes  SDOH Screenings   Food Insecurity: No Food Insecurity (10/23/2022)  Housing: Low Risk  (10/24/2022)  Transportation Needs: Unmet Transportation Needs (11/02/2022)  Utilities: Not At Risk (10/23/2022)  Alcohol Screen: Low Risk  (10/24/2022)  Depression (PHQ2-9): Low Risk  (03/21/2021)  Financial Resource Strain: Low Risk  (10/24/2022)  Physical Activity: Inactive (02/01/2021)  Social Connections: Unknown (08/21/2021)   Received from [facility]  Stress: Stress Concern Present (02/23/2021)  Tobacco Use: Medium Risk (10/23/2022)    Burna Sis, LCSW Clinical Social Worker [facility] Desk#: [phone] Cell#: [phone]

## 2022-11-03 ENCOUNTER — Ambulatory Visit (HOSPITAL_COMMUNITY)
Admit: 2022-11-03 | Discharge: 2022-11-03 | Disposition: A | Discharge status: Home / Self Care | Payer: Medicaid Other | Source: Ambulatory Visit | Attending: Adult Health | Admitting: Adult Health

## 2022-11-03 ENCOUNTER — Telehealth (HOSPITAL_COMMUNITY): Payer: Self-pay | Admitting: Licensed Clinical Social Worker

## 2022-11-03 ENCOUNTER — Encounter (HOSPITAL_COMMUNITY): Payer: Self-pay

## 2022-11-03 VITALS — BP 142/80 | HR 101 | Wt 283.8 lb

## 2022-11-03 DIAGNOSIS — J9610 Chronic respiratory failure, unspecified whether with hypoxia or hypercapnia: Secondary | ICD-10-CM | POA: Diagnosis not present

## 2022-11-03 DIAGNOSIS — I5033 Acute on chronic diastolic (congestive) heart failure: Secondary | ICD-10-CM | POA: Diagnosis not present

## 2022-11-03 DIAGNOSIS — I10 Essential (primary) hypertension: Secondary | ICD-10-CM

## 2022-11-03 DIAGNOSIS — J441 Chronic obstructive pulmonary disease with (acute) exacerbation: Secondary | ICD-10-CM | POA: Diagnosis not present

## 2022-11-03 DIAGNOSIS — I110 Hypertensive heart disease with heart failure: Secondary | ICD-10-CM | POA: Diagnosis not present

## 2022-11-03 DIAGNOSIS — I5032 Chronic diastolic (congestive) heart failure: Secondary | ICD-10-CM

## 2022-11-03 DIAGNOSIS — M109 Gout, unspecified: Secondary | ICD-10-CM | POA: Insufficient documentation

## 2022-11-03 DIAGNOSIS — E042 Nontoxic multinodular goiter: Secondary | ICD-10-CM | POA: Insufficient documentation

## 2022-11-03 DIAGNOSIS — Z79899 Other long term (current) drug therapy: Secondary | ICD-10-CM | POA: Insufficient documentation

## 2022-11-03 DIAGNOSIS — E785 Hyperlipidemia, unspecified: Secondary | ICD-10-CM | POA: Diagnosis not present

## 2022-11-03 DIAGNOSIS — G4733 Obstructive sleep apnea (adult) (pediatric): Secondary | ICD-10-CM | POA: Insufficient documentation

## 2022-11-03 DIAGNOSIS — J9611 Chronic respiratory failure with hypoxia: Secondary | ICD-10-CM

## 2022-11-03 DIAGNOSIS — Z6841 Body Mass Index (BMI) 40.0 and over, adult: Secondary | ICD-10-CM | POA: Diagnosis not present

## 2022-11-03 LAB — BASIC METABOLIC PANEL
Anion gap: 10 (ref 5–15)
BUN: 7 mg/dL — ABNORMAL LOW (ref 8–23)
CO2: 29 mmol/L (ref 22–32)
Calcium: 8.4 mg/dL — ABNORMAL LOW (ref 8.9–10.3)
Chloride: 101 mmol/L (ref 98–111)
Creatinine, Ser: 0.72 mg/dL (ref 0.44–1.00)
GFR, Estimated: >60 mL/min (ref >60)
Glucose, Bld: 112 mg/dL — ABNORMAL HIGH (ref 70–99)
Potassium: 3 mmol/L — ABNORMAL LOW (ref 3.5–5.1)
Sodium: 140 mmol/L (ref 135–145)

## 2022-11-03 LAB — BRAIN NATRIURETIC PEPTIDE: B Natriuretic Peptide: 114.8 pg/mL — ABNORMAL HIGH (ref 0.0–100.0)

## 2022-11-03 MED ORDER — SPIRONOLACTONE 25 MG PO TABS: 25.0000 mg | ORAL_TABLET | Freq: Every day | ORAL | 5 refills | Status: DC

## 2022-11-03 MED ORDER — TORSEMIDE 20 MG PO TABS: 40.0000 mg | ORAL_TABLET | Freq: Two times a day (BID) | ORAL | 5 refills | Status: DC

## 2022-11-03 NOTE — Patient Instructions (Addendum)
EKG done today.   Labs done today. We will contact you only if your labs are abnormal.  START Spironolactone 25mg  (1 tablet) by mouth daily.  INCREASE Torsemide to 40mg  (2 tablets) by mouth 2 times daily.   No other medication changes were made. Please continue all current medications as prescribed.  Your physician recommends that you schedule a follow-up appointment in: 2-3 weeks with our NP/PA Clinic and in 8 weeks with Dr. Shirlee Latch  If you have any questions or concerns before your next appointment please send [place] a message through [facility] or call our office at [phone].    TO LEAVE A MESSAGE FOR THE NURSE SELECT OPTION 2, PLEASE LEAVE A MESSAGE INCLUDING: YOUR NAME DATE OF BIRTH CALL BACK NUMBER REASON FOR CALL**this is important as we prioritize the call backs  YOU WILL RECEIVE A CALL BACK THE SAME DAY AS LONG AS YOU CALL BEFORE 4:00 PM   Do the following things EVERYDAY: Weigh yourself in the morning before breakfast. Write it down and keep it in a log. Take your medicines as prescribed Eat low salt foods--Limit salt (sodium) to 2000 mg per day.  Stay as active as you can everyday Limit all fluids for the day to less than 2 liters   At the [facility], you and your health needs are our priority. As part of our continuing mission to provide you with exceptional heart care, we have created designated Provider Care Teams. These Care Teams include your primary Cardiologist (physician) and Advanced Practice Providers (APPs- Physician Assistants and Nurse Practitioners) who all work together to provide you with the care you need, when you need it.   You may see any of the following providers on your designated Care Team at your next follow up: Dr Arvilla Meres Dr Marca Ancona Dr. Marcos Eke, NP Robbie Lis, [place] [facility] [place], [place] Brynda Peon, NP Karle Plumber, PharmD   Please be sure to bring in all your  medications bottles to every appointment.    Thank you for choosing  [facility]

## 2022-11-03 NOTE — Telephone Encounter (Signed)
CSW called cab company to check on ride as pt has not arrived for appt.  They state when they went to pick up another cab company had already picked up.  Per triage pt had left VM stating she was getting another ride to get here today.  Burna Sis, LCSW Clinical Social Worker [facility] Desk#: [phone] Cell#: [phone]

## 2022-11-06 ENCOUNTER — Telehealth (HOSPITAL_COMMUNITY): Payer: Self-pay

## 2022-11-06 DIAGNOSIS — I5032 Chronic diastolic (congestive) heart failure: Secondary | ICD-10-CM

## 2022-11-06 MED ORDER — POTASSIUM CHLORIDE CRYS ER 20 MEQ PO TBCR: 40.0000 meq | EXTENDED_RELEASE_TABLET | Freq: Every day | ORAL | 5 refills | Status: DC
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
## 2022-11-06 NOTE — Addendum Note (Signed)
Encounter addended by: Burna Sis, LCSW on: 11/06/2022 1:09 PM  Actions taken: Clinical Note Signed

## 2022-11-06 NOTE — Addendum Note (Signed)
Encounter addended by: Burna Sis, LCSW on: 11/06/2022 10:40 AM  Actions taken: Flowsheet accepted, Pend clinical note

## 2022-11-06 NOTE — Telephone Encounter (Signed)
-----   Message from Jacklynn Ganong sent at 11/03/2022  7:13 PM EDT ----- K is low. Cleda Daub was started at [facility] which should help bring it up. Please take 40 KCL x 1 dose only, repeat BMET next week

## 2022-11-08 ENCOUNTER — Telehealth (HOSPITAL_COMMUNITY): Payer: Self-pay | Admitting: Licensed Clinical Social Worker

## 2022-11-08 ENCOUNTER — Telehealth (HOSPITAL_COMMUNITY): Payer: Self-pay

## 2022-11-08 ENCOUNTER — Other Ambulatory Visit (HOSPITAL_COMMUNITY): Payer: Self-pay

## 2022-11-08 NOTE — Telephone Encounter (Signed)
H&V Care Navigation CSW Progress Note  Clinical Social Worker called pt to inform of Medicaid stopping her benefit for $80/month from [employer]- pt provided customer service number to inquire if she is eligible for a different benefit.  Patient is participating in a Managed Medicaid Plan:  Yes  SDOH Screenings   Food Insecurity: No Food Insecurity (10/23/2022)  Housing: Low Risk  (10/24/2022)  Transportation Needs: Unmet Transportation Needs (11/02/2022)  Utilities: Not At Risk (10/23/2022)  Alcohol Screen: Low Risk  (10/24/2022)  Depression (PHQ2-9): Low Risk  (03/21/2021)  Financial Resource Strain: Low Risk  (10/24/2022)  Physical Activity: Inactive (02/01/2021)  Social Connections: Unknown (08/21/2021)   Received from [facility]  Stress: Stress Concern Present (02/23/2021)  Tobacco Use: Medium Risk (11/03/2022)   Burna Sis, LCSW Clinical Social Worker [facility] Desk#: [phone] Cell#: [phone]

## 2022-11-08 NOTE — Telephone Encounter (Signed)
Patient called nurse triage line and left message asking about potassium dosage.   Called patient back- advised her that this should be a 1 time dose of potassium and she was to take 2 tablets to equal and return next week for repeat blood work.   Patient states that her "body has been out of potassium for over a month". Patient states that she took a dose this morning and tonight. Advised patient that it should be 1 dose only- due to her recently starting spironolactone. Patient yelling on phone and demanding nausea medicine and is hard to converse with. Advised patient that we do not prescribe those medications patient states bye and hung up the phone.

## 2022-11-08 NOTE — Telephone Encounter (Signed)
Spoke to [facility] [employer] about Potassium that was filled. Potassium was not placed in bubble pack.

## 2022-11-16 ENCOUNTER — Other Ambulatory Visit (HOSPITAL_COMMUNITY): Payer: Self-pay

## 2022-11-16 ENCOUNTER — Other Ambulatory Visit (HOSPITAL_COMMUNITY): Payer: Medicaid Other

## 2022-11-17 ENCOUNTER — Other Ambulatory Visit (HOSPITAL_COMMUNITY): Payer: Self-pay | Admitting: Cardiology

## 2022-11-17 MED ORDER — LOSARTAN POTASSIUM 25 MG PO TABS: 25.0000 mg | ORAL_TABLET | Freq: Every day | ORAL | 1 refills | Status: DC

## 2022-11-17 NOTE — Telephone Encounter (Signed)
Patient contacted office again this week regarding medications,. Reports she is in need of refills and pharmacy will not fill meds    Spoke with Pharmacy staff [facility] -pt is requesting refills of pulmicort, klonopin,buspar,levothyroxine,   Pt will need to re-establish with pcp for refills  Phone number given to [facility]

## 2022-11-20 ENCOUNTER — Telehealth (HOSPITAL_COMMUNITY): Payer: Self-pay | Admitting: Cardiology

## 2022-11-20 NOTE — Telephone Encounter (Signed)
Patient called to reschedule lab appt -rescheduled for 8/14 @ 215p  Patient also requests refills of medications. Reports [facility] Lem[facility] will not give meds -again advised pt will need to reach out to previous pcp for refills (dr Mikeal Hawthorne listed in the chart unsure if current patient) or establish with new pcp. Number for [facility] given to patient again

## 2022-11-22 ENCOUNTER — Ambulatory Visit (HOSPITAL_COMMUNITY)
Admission: RE | Admit: 2022-11-22 | Discharge: 2022-11-22 | Disposition: A | Discharge status: Home / Self Care | Payer: Medicaid Other | Source: Ambulatory Visit | Attending: Cardiology | Admitting: Cardiology

## 2022-11-22 DIAGNOSIS — I5032 Chronic diastolic (congestive) heart failure: Secondary | ICD-10-CM | POA: Insufficient documentation

## 2022-11-22 LAB — BASIC METABOLIC PANEL
Anion gap: 11 (ref 5–15)
BUN: 23 mg/dL (ref 8–23)
CO2: 26 mmol/L (ref 22–32)
Calcium: 9 mg/dL (ref 8.9–10.3)
Chloride: 98 mmol/L (ref 98–111)
Creatinine, Ser: 0.99 mg/dL (ref 0.44–1.00)
GFR, Estimated: >60 mL/min (ref >60)
Glucose, Bld: 109 mg/dL — ABNORMAL HIGH (ref 70–99)
Potassium: 3.9 mmol/L (ref 3.5–5.1)
Sodium: 135 mmol/L (ref 135–145)

## 2022-11-24 MED ORDER — BUSPIRONE HCL 5 MG PO TABS: 5.0000 mg | ORAL_TABLET | Freq: Three times a day (TID) | ORAL | 0 refills | Status: DC

## 2022-11-24 MED ORDER — SENNOSIDES-DOCUSATE SODIUM 8.6-50 MG PO TABS: 1.0000 | ORAL_TABLET | Freq: Every day | ORAL | 0 refills | Status: DC

## 2022-11-24 MED ORDER — ALBUTEROL SULFATE HFA 108 (90 BASE) MCG/ACT IN AERS: 1.0000 | INHALATION_SPRAY | Freq: Four times a day (QID) | RESPIRATORY_TRACT | 0 refills | Status: DC | PRN

## 2022-11-24 MED ORDER — LOPERAMIDE HCL 2 MG PO CAPS: 2.0000 mg | ORAL_CAPSULE | Freq: Four times a day (QID) | ORAL | 0 refills | Status: DC | PRN

## 2022-11-24 MED ORDER — OMEPRAZOLE 20 MG PO CPDR: 20.0000 mg | DELAYED_RELEASE_CAPSULE | Freq: Two times a day (BID) | ORAL | 0 refills | Status: DC | PRN

## 2022-11-24 MED ORDER — CARVEDILOL 3.125 MG PO TABS: 3.1250 mg | ORAL_TABLET | Freq: Two times a day (BID) | ORAL | 11 refills | Status: DC

## 2022-11-24 MED ORDER — FEROSUL 325 (65 FE) MG PO TABS: 325.0000 mg | ORAL_TABLET | Freq: Every day | ORAL | 0 refills | Status: DC

## 2022-11-24 MED ORDER — LEVOTHYROXINE SODIUM 112 MCG PO TABS: 224.0000 ug | ORAL_TABLET | Freq: Every day | ORAL | 0 refills | Status: DC

## 2022-11-24 MED ORDER — LOSARTAN POTASSIUM 25 MG PO TABS: 25.0000 mg | ORAL_TABLET | Freq: Every day | ORAL | 11 refills | Status: DC

## 2022-11-24 MED ORDER — BUTALBITAL-APAP-CAFFEINE 50-325-40 MG PO TABS: 1.0000 | ORAL_TABLET | Freq: Two times a day (BID) | ORAL | 0 refills | Status: DC | PRN

## 2022-11-24 MED ORDER — TORSEMIDE 20 MG PO TABS: 40.0000 mg | ORAL_TABLET | Freq: Two times a day (BID) | ORAL | 11 refills | Status: DC

## 2022-11-24 MED ORDER — TRAZODONE HCL 50 MG PO TABS: 100.0000 mg | ORAL_TABLET | Freq: Every day | ORAL | 0 refills | Status: DC

## 2022-11-24 MED ORDER — XIFAXAN 550 MG PO TABS: 550.0000 mg | ORAL_TABLET | Freq: Three times a day (TID) | ORAL | 0 refills | Status: DC

## 2022-11-24 MED ORDER — FENOFIBRATE 145 MG PO TABS: 145.0000 mg | ORAL_TABLET | Freq: Every day | ORAL | 11 refills | Status: DC

## 2022-11-24 MED ORDER — LEVOCETIRIZINE DIHYDROCHLORIDE 5 MG PO TABS: 5.0000 mg | ORAL_TABLET | Freq: Every day | ORAL | 0 refills | Status: DC

## 2022-11-24 MED ORDER — BUDESONIDE 0.25 MG/2ML IN SUSP: 0.2500 mg | Freq: Two times a day (BID) | RESPIRATORY_TRACT | 0 refills | Status: DC

## 2022-11-24 MED ORDER — POLYETHYLENE GLYCOL 3350 17 G PO PACK: 17.0000 g | PACK | Freq: Every day | ORAL | 0 refills | Status: DC | PRN

## 2022-11-24 MED ORDER — CLONAZEPAM 2 MG PO TABS: 2.0000 mg | ORAL_TABLET | Freq: Three times a day (TID) | ORAL | 0 refills | Status: DC

## 2022-11-24 MED ORDER — ROSUVASTATIN CALCIUM 10 MG PO TABS: 10.0000 mg | ORAL_TABLET | Freq: Every evening | ORAL | 11 refills | Status: DC

## 2022-11-24 MED ORDER — SPIRONOLACTONE 25 MG PO TABS: 25.0000 mg | ORAL_TABLET | Freq: Every day | ORAL | 11 refills | Status: DC

## 2022-11-24 NOTE — Telephone Encounter (Signed)
Patient has contacted office on multiple occasions regarding med refills. Refills requested include klonopin buspar,levothyroxine,omeprazole,pulmicort, trazodone, torsemide, losartan, loperamide and fioricet.   Pts new patient appt scheduled with [facility] 01/2023 Attempted to assist pt with sooner appt at [facility]-next available 01/03/23.   Per Dr Wilmon Pali to fill all meds x 1 month until she is establishes with PCP   Unable to fill quantity sufficient (40 days)until OV 9/25 per pharmacy, insurance will not pay.  30 days scripts sent

## 2022-11-24 NOTE — Addendum Note (Signed)
Addended by: Theresia Bough on: 11/24/2022 11:21 AM   Modules accepted: Orders
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
## 2022-11-28 ENCOUNTER — Ambulatory Visit: Payer: Medicaid Other | Attending: Physician Assistant | Admitting: Physician Assistant

## 2022-11-28 DIAGNOSIS — I10 Essential (primary) hypertension: Secondary | ICD-10-CM

## 2022-11-28 DIAGNOSIS — J9611 Chronic respiratory failure with hypoxia: Secondary | ICD-10-CM

## 2022-11-28 DIAGNOSIS — E039 Hypothyroidism, unspecified: Secondary | ICD-10-CM

## 2022-11-28 DIAGNOSIS — I5032 Chronic diastolic (congestive) heart failure: Secondary | ICD-10-CM

## 2022-11-30 ENCOUNTER — Telehealth (HOSPITAL_COMMUNITY): Payer: Self-pay

## 2022-11-30 NOTE — Telephone Encounter (Signed)
Called and left patient a message to confirm/remind patient of their appointment at the [facility] on 12/01/22.   And to bring in all medications and/or complete list.

## 2022-12-01 ENCOUNTER — Encounter (HOSPITAL_COMMUNITY): Payer: Medicaid Other

## 2022-12-13 ENCOUNTER — Inpatient Hospital Stay (HOSPITAL_COMMUNITY)
Admission: EM | Admit: 2022-12-13 | Discharge: 2022-12-16 | DRG: 189 | Disposition: A | Discharge status: Home Health | Payer: Medicaid Other | Attending: Family Medicine | Admitting: Family Medicine

## 2022-12-13 ENCOUNTER — Emergency Department (HOSPITAL_COMMUNITY): Payer: Medicaid Other

## 2022-12-13 ENCOUNTER — Encounter (HOSPITAL_COMMUNITY): Payer: Self-pay

## 2022-12-13 ENCOUNTER — Other Ambulatory Visit: Payer: Self-pay

## 2022-12-13 DIAGNOSIS — Z7951 Long term (current) use of inhaled steroids: Secondary | ICD-10-CM

## 2022-12-13 DIAGNOSIS — F418 Other specified anxiety disorders: Secondary | ICD-10-CM | POA: Diagnosis present

## 2022-12-13 DIAGNOSIS — M109 Gout, unspecified: Secondary | ICD-10-CM | POA: Diagnosis present

## 2022-12-13 DIAGNOSIS — Z79899 Other long term (current) drug therapy: Secondary | ICD-10-CM

## 2022-12-13 DIAGNOSIS — G894 Chronic pain syndrome: Secondary | ICD-10-CM | POA: Diagnosis present

## 2022-12-13 DIAGNOSIS — F03918 Unspecified dementia, unspecified severity, with other behavioral disturbance: Secondary | ICD-10-CM | POA: Diagnosis present

## 2022-12-13 DIAGNOSIS — F319 Bipolar disorder, unspecified: Secondary | ICD-10-CM | POA: Diagnosis present

## 2022-12-13 DIAGNOSIS — J441 Chronic obstructive pulmonary disease with (acute) exacerbation: Secondary | ICD-10-CM | POA: Diagnosis present

## 2022-12-13 DIAGNOSIS — E119 Type 2 diabetes mellitus without complications: Secondary | ICD-10-CM

## 2022-12-13 DIAGNOSIS — J9621 Acute and chronic respiratory failure with hypoxia: Secondary | ICD-10-CM | POA: Diagnosis present

## 2022-12-13 DIAGNOSIS — Z85850 Personal history of malignant neoplasm of thyroid: Secondary | ICD-10-CM

## 2022-12-13 DIAGNOSIS — Z888 Allergy status to other drugs, medicaments and biological substances status: Secondary | ICD-10-CM

## 2022-12-13 DIAGNOSIS — E039 Hypothyroidism, unspecified: Secondary | ICD-10-CM | POA: Diagnosis present

## 2022-12-13 DIAGNOSIS — M1990 Unspecified osteoarthritis, unspecified site: Secondary | ICD-10-CM | POA: Diagnosis present

## 2022-12-13 DIAGNOSIS — Z808 Family history of malignant neoplasm of other organs or systems: Secondary | ICD-10-CM

## 2022-12-13 DIAGNOSIS — Z87891 Personal history of nicotine dependence: Secondary | ICD-10-CM

## 2022-12-13 DIAGNOSIS — Z809 Family history of malignant neoplasm, unspecified: Secondary | ICD-10-CM

## 2022-12-13 DIAGNOSIS — J9602 Acute respiratory failure with hypercapnia: Secondary | ICD-10-CM | POA: Diagnosis present

## 2022-12-13 DIAGNOSIS — J9622 Acute and chronic respiratory failure with hypercapnia: Principal | ICD-10-CM | POA: Diagnosis present

## 2022-12-13 DIAGNOSIS — Z602 Problems related to living alone: Secondary | ICD-10-CM | POA: Diagnosis present

## 2022-12-13 DIAGNOSIS — Z638 Other specified problems related to primary support group: Secondary | ICD-10-CM

## 2022-12-13 DIAGNOSIS — F1120 Opioid dependence, uncomplicated: Secondary | ICD-10-CM | POA: Diagnosis present

## 2022-12-13 DIAGNOSIS — Z90710 Acquired absence of both cervix and uterus: Secondary | ICD-10-CM

## 2022-12-13 DIAGNOSIS — F0394 Unspecified dementia, unspecified severity, with anxiety: Secondary | ICD-10-CM | POA: Diagnosis present

## 2022-12-13 DIAGNOSIS — I5032 Chronic diastolic (congestive) heart failure: Secondary | ICD-10-CM | POA: Diagnosis present

## 2022-12-13 DIAGNOSIS — F0393 Unspecified dementia, unspecified severity, with mood disturbance: Secondary | ICD-10-CM | POA: Diagnosis present

## 2022-12-13 DIAGNOSIS — G8929 Other chronic pain: Secondary | ICD-10-CM | POA: Diagnosis present

## 2022-12-13 DIAGNOSIS — K219 Gastro-esophageal reflux disease without esophagitis: Secondary | ICD-10-CM | POA: Diagnosis present

## 2022-12-13 DIAGNOSIS — E890 Postprocedural hypothyroidism: Secondary | ICD-10-CM | POA: Diagnosis present

## 2022-12-13 DIAGNOSIS — F209 Schizophrenia, unspecified: Secondary | ICD-10-CM | POA: Diagnosis present

## 2022-12-13 DIAGNOSIS — Z86718 Personal history of other venous thrombosis and embolism: Secondary | ICD-10-CM

## 2022-12-13 DIAGNOSIS — Z6841 Body Mass Index (BMI) 40.0 and over, adult: Secondary | ICD-10-CM

## 2022-12-13 DIAGNOSIS — I10 Essential (primary) hypertension: Secondary | ICD-10-CM | POA: Diagnosis present

## 2022-12-13 DIAGNOSIS — I110 Hypertensive heart disease with heart failure: Secondary | ICD-10-CM | POA: Diagnosis present

## 2022-12-13 DIAGNOSIS — E1169 Type 2 diabetes mellitus with other specified complication: Secondary | ICD-10-CM

## 2022-12-13 DIAGNOSIS — G9340 Encephalopathy, unspecified: Secondary | ICD-10-CM | POA: Diagnosis present

## 2022-12-13 DIAGNOSIS — Z91030 Bee allergy status: Secondary | ICD-10-CM

## 2022-12-13 DIAGNOSIS — J449 Chronic obstructive pulmonary disease, unspecified: Secondary | ICD-10-CM | POA: Diagnosis present

## 2022-12-13 DIAGNOSIS — G4700 Insomnia, unspecified: Secondary | ICD-10-CM | POA: Diagnosis present

## 2022-12-13 DIAGNOSIS — R400 Somnolence: Principal | ICD-10-CM

## 2022-12-13 DIAGNOSIS — G43909 Migraine, unspecified, not intractable, without status migrainosus: Secondary | ICD-10-CM | POA: Diagnosis present

## 2022-12-13 DIAGNOSIS — Z7982 Long term (current) use of aspirin: Secondary | ICD-10-CM

## 2022-12-13 DIAGNOSIS — F05 Delirium due to known physiological condition: Secondary | ICD-10-CM | POA: Diagnosis present

## 2022-12-13 DIAGNOSIS — G931 Anoxic brain damage, not elsewhere classified: Secondary | ICD-10-CM | POA: Diagnosis present

## 2022-12-13 DIAGNOSIS — Z886 Allergy status to analgesic agent status: Secondary | ICD-10-CM

## 2022-12-13 DIAGNOSIS — Z833 Family history of diabetes mellitus: Secondary | ICD-10-CM

## 2022-12-13 DIAGNOSIS — Z9889 Other specified postprocedural states: Secondary | ICD-10-CM

## 2022-12-13 DIAGNOSIS — Z79890 Hormone replacement therapy (postmenopausal): Secondary | ICD-10-CM

## 2022-12-13 DIAGNOSIS — Z9089 Acquired absence of other organs: Secondary | ICD-10-CM

## 2022-12-13 LAB — CBC WITH DIFFERENTIAL/PLATELET
Abs Immature Granulocytes: 0.04 10*3/uL (ref 0.00–0.07)
Basophils Absolute: 0 10*3/uL (ref 0.0–0.1)
Basophils Relative: 0 %
Eosinophils Absolute: 0.3 10*3/uL (ref 0.0–0.5)
Eosinophils Relative: 2 %
HCT: 38.6 % (ref 36.0–46.0)
Hemoglobin: 11.6 g/dL — ABNORMAL LOW (ref 12.0–15.0)
Immature Granulocytes: 0 %
Lymphocytes Relative: 20 %
Lymphs Abs: 2.5 10*3/uL (ref 0.7–4.0)
MCH: 29.2 pg (ref 26.0–34.0)
MCHC: 30.1 g/dL (ref 30.0–36.0)
MCV: 97.2 fL (ref 80.0–100.0)
Monocytes Absolute: 0.8 10*3/uL (ref 0.1–1.0)
Monocytes Relative: 6 %
Neutro Abs: 8.8 10*3/uL — ABNORMAL HIGH (ref 1.7–7.7)
Neutrophils Relative %: 72 %
Platelets: 271 10*3/uL (ref 150–400)
RBC: 3.97 MIL/uL (ref 3.87–5.11)
RDW: 13.2 % (ref 11.5–15.5)
WBC: 12.4 10*3/uL — ABNORMAL HIGH (ref 4.0–10.5)
nRBC: 0 % (ref 0.0–0.2)

## 2022-12-13 LAB — URINALYSIS, ROUTINE W REFLEX MICROSCOPIC
Bilirubin Urine: NEGATIVE
Glucose, UA: NEGATIVE mg/dL
Hgb urine dipstick: NEGATIVE
Ketones, ur: NEGATIVE mg/dL
Leukocytes,Ua: NEGATIVE
Nitrite: NEGATIVE
Protein, ur: NEGATIVE mg/dL
Specific Gravity, Urine: 1.023 (ref 1.005–1.030)
pH: 5 (ref 5.0–8.0)

## 2022-12-13 LAB — COMPREHENSIVE METABOLIC PANEL
ALT: 14 U/L (ref 0–44)
AST: 15 U/L (ref 15–41)
Albumin: 3.7 g/dL (ref 3.5–5.0)
Alkaline Phosphatase: 99 U/L (ref 38–126)
Anion gap: 9 (ref 5–15)
BUN: 11 mg/dL (ref 8–23)
CO2: 28 mmol/L (ref 22–32)
Calcium: 8.8 mg/dL — ABNORMAL LOW (ref 8.9–10.3)
Chloride: 105 mmol/L (ref 98–111)
Creatinine, Ser: 0.78 mg/dL (ref 0.44–1.00)
GFR, Estimated: >60 mL/min (ref >60)
Glucose, Bld: 95 mg/dL (ref 70–99)
Potassium: 4.1 mmol/L (ref 3.5–5.1)
Sodium: 142 mmol/L (ref 135–145)
Total Bilirubin: 0.4 mg/dL (ref 0.3–1.2)
Total Protein: 6.8 g/dL (ref 6.5–8.1)

## 2022-12-13 LAB — RAPID URINE DRUG SCREEN, HOSP PERFORMED
Amphetamines: NOT DETECTED
Barbiturates: POSITIVE — AB
Benzodiazepines: POSITIVE — AB
Cocaine: NOT DETECTED
Opiates: NOT DETECTED
Tetrahydrocannabinol: NOT DETECTED

## 2022-12-13 LAB — ETHANOL: Alcohol, Ethyl (B): <10 mg/dL (ref <10)

## 2022-12-13 LAB — BRAIN NATRIURETIC PEPTIDE: B Natriuretic Peptide: 41.5 pg/mL (ref 0.0–100.0)

## 2022-12-13 LAB — CBG MONITORING, ED: Glucose-Capillary: 81 mg/dL (ref 70–99)

## 2022-12-13 LAB — SALICYLATE LEVEL: Salicylate Lvl: <7 mg/dL — ABNORMAL LOW (ref 7.0–30.0)

## 2022-12-13 MED ORDER — IOHEXOL 350 MG/ML SOLN
75.0000 mL | Freq: Once | INTRAVENOUS | Status: AC | PRN
  Administered 2022-12-13: 75 mL via INTRAVENOUS
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
## 2022-12-13 NOTE — ED Notes (Signed)
Patient is now at assigned bed.

## 2022-12-13 NOTE — ED Notes (Signed)
Patient called to go to room 3 times. No response.

## 2022-12-13 NOTE — ED Notes (Addendum)
PT came in via EMS. Pt stated she was normally on 2lpm via [place] at home.  EMS had the Rew on the Pt, but was not hooked up to O2.  Pt's O@ saturation dropped to 89%.  Pt was placed back on O2 and O2 saturation improved to 94%

## 2022-12-14 ENCOUNTER — Encounter (HOSPITAL_COMMUNITY): Payer: Self-pay | Admitting: Family Medicine

## 2022-12-14 DIAGNOSIS — E119 Type 2 diabetes mellitus without complications: Secondary | ICD-10-CM | POA: Diagnosis present

## 2022-12-14 DIAGNOSIS — G894 Chronic pain syndrome: Secondary | ICD-10-CM | POA: Diagnosis not present

## 2022-12-14 DIAGNOSIS — M1990 Unspecified osteoarthritis, unspecified site: Secondary | ICD-10-CM | POA: Diagnosis present

## 2022-12-14 DIAGNOSIS — G9340 Encephalopathy, unspecified: Secondary | ICD-10-CM | POA: Diagnosis not present

## 2022-12-14 DIAGNOSIS — F03918 Unspecified dementia, unspecified severity, with other behavioral disturbance: Secondary | ICD-10-CM | POA: Diagnosis present

## 2022-12-14 DIAGNOSIS — F319 Bipolar disorder, unspecified: Secondary | ICD-10-CM | POA: Diagnosis present

## 2022-12-14 DIAGNOSIS — G43909 Migraine, unspecified, not intractable, without status migrainosus: Secondary | ICD-10-CM | POA: Diagnosis present

## 2022-12-14 DIAGNOSIS — I5032 Chronic diastolic (congestive) heart failure: Secondary | ICD-10-CM

## 2022-12-14 DIAGNOSIS — J9621 Acute and chronic respiratory failure with hypoxia: Secondary | ICD-10-CM | POA: Diagnosis present

## 2022-12-14 DIAGNOSIS — G931 Anoxic brain damage, not elsewhere classified: Secondary | ICD-10-CM | POA: Diagnosis present

## 2022-12-14 DIAGNOSIS — G8929 Other chronic pain: Secondary | ICD-10-CM | POA: Diagnosis present

## 2022-12-14 DIAGNOSIS — F418 Other specified anxiety disorders: Secondary | ICD-10-CM

## 2022-12-14 DIAGNOSIS — F1120 Opioid dependence, uncomplicated: Secondary | ICD-10-CM | POA: Diagnosis present

## 2022-12-14 DIAGNOSIS — F05 Delirium due to known physiological condition: Secondary | ICD-10-CM | POA: Diagnosis present

## 2022-12-14 DIAGNOSIS — J9622 Acute and chronic respiratory failure with hypercapnia: Secondary | ICD-10-CM | POA: Diagnosis present

## 2022-12-14 DIAGNOSIS — I110 Hypertensive heart disease with heart failure: Secondary | ICD-10-CM | POA: Diagnosis present

## 2022-12-14 DIAGNOSIS — J9602 Acute respiratory failure with hypercapnia: Secondary | ICD-10-CM | POA: Diagnosis present

## 2022-12-14 DIAGNOSIS — E890 Postprocedural hypothyroidism: Secondary | ICD-10-CM | POA: Diagnosis present

## 2022-12-14 DIAGNOSIS — Z6841 Body Mass Index (BMI) 40.0 and over, adult: Secondary | ICD-10-CM | POA: Diagnosis not present

## 2022-12-14 DIAGNOSIS — E039 Hypothyroidism, unspecified: Secondary | ICD-10-CM

## 2022-12-14 DIAGNOSIS — G4700 Insomnia, unspecified: Secondary | ICD-10-CM | POA: Diagnosis present

## 2022-12-14 DIAGNOSIS — J441 Chronic obstructive pulmonary disease with (acute) exacerbation: Secondary | ICD-10-CM | POA: Diagnosis present

## 2022-12-14 DIAGNOSIS — K219 Gastro-esophageal reflux disease without esophagitis: Secondary | ICD-10-CM | POA: Diagnosis present

## 2022-12-14 DIAGNOSIS — J449 Chronic obstructive pulmonary disease, unspecified: Secondary | ICD-10-CM

## 2022-12-14 DIAGNOSIS — F0394 Unspecified dementia, unspecified severity, with anxiety: Secondary | ICD-10-CM | POA: Diagnosis present

## 2022-12-14 DIAGNOSIS — F209 Schizophrenia, unspecified: Secondary | ICD-10-CM | POA: Diagnosis present

## 2022-12-14 DIAGNOSIS — R400 Somnolence: Secondary | ICD-10-CM | POA: Diagnosis present

## 2022-12-14 DIAGNOSIS — F0393 Unspecified dementia, unspecified severity, with mood disturbance: Secondary | ICD-10-CM | POA: Diagnosis present

## 2022-12-14 DIAGNOSIS — M109 Gout, unspecified: Secondary | ICD-10-CM | POA: Diagnosis present

## 2022-12-14 LAB — POCT I-STAT EG7
Acid-Base Excess: 1 mmol/L (ref 0.0–2.0)
Bicarbonate: 29.5 mmol/L — ABNORMAL HIGH (ref 20.0–28.0)
Calcium, Ion: 1.19 mmol/L (ref 1.15–1.40)
HCT: 36 % (ref 36.0–46.0)
Hemoglobin: 12.2 g/dL (ref 12.0–15.0)
O2 Saturation: 98 %
Potassium: 4.3 mmol/L (ref 3.5–5.1)
Sodium: 142 mmol/L (ref 135–145)
TCO2: 31 mmol/L (ref 22–32)
pCO2, Ven: 67.1 mmHg — ABNORMAL HIGH (ref 44–60)
pH, Ven: 7.251 (ref 7.25–7.43)
pO2, Ven: 137 mmHg — ABNORMAL HIGH (ref 32–45)

## 2022-12-14 LAB — CBC
HCT: 35.3 % — ABNORMAL LOW (ref 36.0–46.0)
Hemoglobin: 10.5 g/dL — ABNORMAL LOW (ref 12.0–15.0)
MCH: 29.3 pg (ref 26.0–34.0)
MCHC: 29.7 g/dL — ABNORMAL LOW (ref 30.0–36.0)
MCV: 98.6 fL (ref 80.0–100.0)
Platelets: 252 10*3/uL (ref 150–400)
RBC: 3.58 MIL/uL — ABNORMAL LOW (ref 3.87–5.11)
RDW: 13.2 % (ref 11.5–15.5)
WBC: 11.6 10*3/uL — ABNORMAL HIGH (ref 4.0–10.5)
nRBC: 0 % (ref 0.0–0.2)

## 2022-12-14 LAB — BASIC METABOLIC PANEL
Anion gap: 14 (ref 5–15)
BUN: 10 mg/dL (ref 8–23)
CO2: 25 mmol/L (ref 22–32)
Calcium: 8.6 mg/dL — ABNORMAL LOW (ref 8.9–10.3)
Chloride: 103 mmol/L (ref 98–111)
Creatinine, Ser: 0.7 mg/dL (ref 0.44–1.00)
GFR, Estimated: >60 mL/min (ref >60)
Glucose, Bld: 103 mg/dL — ABNORMAL HIGH (ref 70–99)
Potassium: 4.1 mmol/L (ref 3.5–5.1)
Sodium: 142 mmol/L (ref 135–145)

## 2022-12-14 LAB — GLUCOSE, CAPILLARY
Glucose-Capillary: 89 mg/dL (ref 70–99)
Glucose-Capillary: 98 mg/dL (ref 70–99)
Glucose-Capillary: 77 mg/dL (ref 70–99)
Glucose-Capillary: 103 mg/dL — ABNORMAL HIGH (ref 70–99)
Glucose-Capillary: 126 mg/dL — ABNORMAL HIGH (ref 70–99)

## 2022-12-14 LAB — CBG MONITORING, ED: Glucose-Capillary: 98 mg/dL (ref 70–99)

## 2022-12-14 MED ORDER — ALLOPURINOL 100 MG PO TABS
100.0000 mg | ORAL_TABLET | Freq: Every day | ORAL | Status: DC
  Administered 2022-12-14 – 2022-12-16 (×3): 100 mg via ORAL
  Filled 2022-12-14 (×3): qty 1

## 2022-12-14 MED ORDER — SPIRONOLACTONE 25 MG PO TABS
25.0000 mg | ORAL_TABLET | Freq: Every day | ORAL | Status: DC
  Administered 2022-12-14 – 2022-12-16 (×3): 25 mg via ORAL
  Filled 2022-12-14 (×3): qty 1

## 2022-12-14 MED ORDER — ENOXAPARIN SODIUM 60 MG/0.6ML IJ SOSY
60.0000 mg | PREFILLED_SYRINGE | Freq: Every day | INTRAMUSCULAR | Status: DC
  Administered 2022-12-15 – 2022-12-16 (×2): 60 mg via SUBCUTANEOUS
  Filled 2022-12-14 (×2): qty 0.6

## 2022-12-14 MED ORDER — ORAL CARE MOUTH RINSE: 15.0000 mL | OROMUCOSAL | Status: DC | PRN

## 2022-12-14 MED ORDER — ASPIRIN 81 MG PO TBEC
81.0000 mg | DELAYED_RELEASE_TABLET | Freq: Every day | ORAL | Status: DC
  Administered 2022-12-14 – 2022-12-16 (×3): 81 mg via ORAL
  Filled 2022-12-14 (×3): qty 1

## 2022-12-14 MED ORDER — ROSUVASTATIN CALCIUM 5 MG PO TABS
10.0000 mg | ORAL_TABLET | Freq: Every evening | ORAL | Status: DC
  Administered 2022-12-14 – 2022-12-15 (×2): 10 mg via ORAL
  Filled 2022-12-14 (×2): qty 2

## 2022-12-14 MED ORDER — HALOPERIDOL LACTATE 5 MG/ML IJ SOLN
2.0000 mg | Freq: Four times a day (QID) | INTRAMUSCULAR | Status: DC | PRN
  Administered 2022-12-14: 2 mg via INTRAMUSCULAR
  Filled 2022-12-14: qty 1

## 2022-12-14 MED ORDER — LOSARTAN POTASSIUM 25 MG PO TABS
25.0000 mg | ORAL_TABLET | Freq: Every day | ORAL | Status: DC
  Administered 2022-12-15 – 2022-12-16 (×2): 25 mg via ORAL
  Filled 2022-12-14 (×3): qty 1

## 2022-12-14 MED ORDER — ACETAMINOPHEN 650 MG RE SUPP: 650.0000 mg | Freq: Four times a day (QID) | RECTAL | Status: DC | PRN

## 2022-12-14 MED ORDER — ACETAMINOPHEN 325 MG PO TABS
650.0000 mg | ORAL_TABLET | Freq: Four times a day (QID) | ORAL | Status: DC | PRN
  Administered 2022-12-16: 650 mg via ORAL
  Filled 2022-12-14: qty 2

## 2022-12-14 MED ORDER — ENOXAPARIN SODIUM 40 MG/0.4ML IJ SOSY
40.0000 mg | PREFILLED_SYRINGE | Freq: Every day | INTRAMUSCULAR | Status: DC
  Administered 2022-12-14: 40 mg via SUBCUTANEOUS
  Filled 2022-12-14: qty 0.4

## 2022-12-14 MED ORDER — PANTOPRAZOLE SODIUM 40 MG PO TBEC
40.0000 mg | DELAYED_RELEASE_TABLET | Freq: Every day | ORAL | Status: DC
  Administered 2022-12-14 – 2022-12-16 (×3): 40 mg via ORAL
  Filled 2022-12-14 (×3): qty 1

## 2022-12-14 MED ORDER — ONDANSETRON HCL 4 MG PO TABS: 4.0000 mg | ORAL_TABLET | Freq: Four times a day (QID) | ORAL | Status: DC | PRN

## 2022-12-14 MED ORDER — HALOPERIDOL 1 MG PO TABS: 2.0000 mg | ORAL_TABLET | Freq: Three times a day (TID) | ORAL | Status: DC | PRN

## 2022-12-14 MED ORDER — HALOPERIDOL 5 MG PO TABS
5.0000 mg | ORAL_TABLET | Freq: Every day | ORAL | Status: DC
  Administered 2022-12-14 – 2022-12-15 (×2): 5 mg via ORAL
  Filled 2022-12-14 (×3): qty 1

## 2022-12-14 MED ORDER — BUDESONIDE 0.25 MG/2ML IN SUSP
0.2500 mg | Freq: Two times a day (BID) | RESPIRATORY_TRACT | Status: DC
  Administered 2022-12-14 – 2022-12-16 (×4): 0.25 mg via RESPIRATORY_TRACT
  Filled 2022-12-14 (×4): qty 2

## 2022-12-14 MED ORDER — INSULIN ASPART 100 UNIT/ML IJ SOLN: 0.0000 [IU] | Freq: Three times a day (TID) | INTRAMUSCULAR | Status: DC

## 2022-12-14 MED ORDER — ONDANSETRON HCL 4 MG/2ML IJ SOLN: 4.0000 mg | Freq: Four times a day (QID) | INTRAMUSCULAR | Status: DC | PRN

## 2022-12-14 MED ORDER — RIFAXIMIN 550 MG PO TABS
550.0000 mg | ORAL_TABLET | Freq: Three times a day (TID) | ORAL | Status: DC
  Administered 2022-12-14 – 2022-12-16 (×7): 550 mg via ORAL
  Filled 2022-12-14 (×9): qty 1

## 2022-12-14 MED ORDER — CARVEDILOL 3.125 MG PO TABS
3.1250 mg | ORAL_TABLET | Freq: Two times a day (BID) | ORAL | Status: DC
  Administered 2022-12-14 – 2022-12-16 (×4): 3.125 mg via ORAL
  Filled 2022-12-14 (×5): qty 1

## 2022-12-14 MED ORDER — INSULIN ASPART 100 UNIT/ML IJ SOLN: 0.0000 [IU] | Freq: Every day | INTRAMUSCULAR | Status: DC

## 2022-12-14 MED ORDER — LEVOTHYROXINE SODIUM 112 MCG PO TABS
224.0000 ug | ORAL_TABLET | Freq: Every day | ORAL | Status: DC
  Administered 2022-12-14 – 2022-12-16 (×3): 224 ug via ORAL
  Filled 2022-12-14 (×3): qty 2

## 2022-12-14 MED ORDER — TORSEMIDE 20 MG PO TABS
40.0000 mg | ORAL_TABLET | Freq: Two times a day (BID) | ORAL | Status: DC
  Administered 2022-12-14 – 2022-12-16 (×5): 40 mg via ORAL
  Filled 2022-12-14 (×5): qty 2

## 2022-12-14 MED ORDER — UMECLIDINIUM-VILANTEROL 62.5-25 MCG/ACT IN AEPB
1.0000 | INHALATION_SPRAY | Freq: Every day | RESPIRATORY_TRACT | Status: DC
  Administered 2022-12-14: 1 via RESPIRATORY_TRACT
  Filled 2022-12-14: qty 14

## 2022-12-14 MED ORDER — SODIUM CHLORIDE 0.9% FLUSH
3.0000 mL | Freq: Two times a day (BID) | INTRAVENOUS | Status: DC
  Administered 2022-12-14 – 2022-12-16 (×6): 3 mL via INTRAVENOUS

## 2022-12-14 NOTE — Progress Notes (Signed)
Upon arrival to bedside, patient noted to have a purse filled with home medications. Patient became agitated and refused to allow nursing staff to lock home medications in pharmacy. Patient educated on hospital policies and procedures with home medications being kept at the bedside. Patient became combative removing her monitors and attempting to get out of bed. Security called to bedside. Patient confused and uncooperative. MD notified and IM Haldol order received. Patient eventually allowed security to remove home medications from bedside. Pharmacy notified and awaiting pharmacist pick-up at nursing desk to be locked until discharge.

## 2022-12-14 NOTE — Plan of Care (Signed)

## 2022-12-14 NOTE — ED Notes (Signed)
Report called to [facility] RN floor notified pt on way up
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
## 2022-12-14 NOTE — Progress Notes (Signed)
Patient refused BiPAP states "I have had two sleep studies and passed both of them."  RT will continue to monitor.
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
## 2022-12-14 NOTE — ED Notes (Signed)
Patient sleeping
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
## 2022-12-14 NOTE — Progress Notes (Signed)
Pt has home meds at bedside including klonopin , pt states that she wants to keep meds at bedside. Pt advised about policy and the need to send home meds down to pharmacy pt refuses. Consulting civil engineer and provider on call aware. Day time RN aware. Security called.

## 2022-12-14 NOTE — Plan of Care (Signed)
  Problem: Coping: Goal: Ability to adjust to condition or change in health will improve Outcome: Progressing   Problem: Fluid Volume: Goal: Ability to maintain a balanced intake and output will improve Outcome: Progressing   Problem: Metabolic: Goal: Ability to maintain appropriate glucose levels will improve Outcome: Progressing   Problem: Nutritional: Goal: Maintenance of adequate nutrition will improve Outcome: Progressing   

## 2022-12-15 DIAGNOSIS — F05 Delirium due to known physiological condition: Secondary | ICD-10-CM

## 2022-12-15 DIAGNOSIS — J9602 Acute respiratory failure with hypercapnia: Secondary | ICD-10-CM | POA: Diagnosis not present

## 2022-12-15 LAB — CBC
HCT: 37.6 % (ref 36.0–46.0)
Hemoglobin: 11.3 g/dL — ABNORMAL LOW (ref 12.0–15.0)
MCH: 28.5 pg (ref 26.0–34.0)
MCHC: 30.1 g/dL (ref 30.0–36.0)
MCV: 94.7 fL (ref 80.0–100.0)
Platelets: 271 10*3/uL (ref 150–400)
RBC: 3.97 MIL/uL (ref 3.87–5.11)
RDW: 13.1 % (ref 11.5–15.5)
WBC: 11.3 10*3/uL — ABNORMAL HIGH (ref 4.0–10.5)
nRBC: 0 % (ref 0.0–0.2)

## 2022-12-15 LAB — MAGNESIUM: Magnesium: 1.8 mg/dL (ref 1.7–2.4)

## 2022-12-15 LAB — GLUCOSE, CAPILLARY
Glucose-Capillary: 114 mg/dL — ABNORMAL HIGH (ref 70–99)
Glucose-Capillary: 102 mg/dL — ABNORMAL HIGH (ref 70–99)
Glucose-Capillary: 113 mg/dL — ABNORMAL HIGH (ref 70–99)
Glucose-Capillary: 111 mg/dL — ABNORMAL HIGH (ref 70–99)
Glucose-Capillary: 139 mg/dL — ABNORMAL HIGH (ref 70–99)

## 2022-12-15 LAB — LIPID PANEL
Cholesterol: 149 mg/dL (ref 0–200)
HDL: 49 mg/dL
LDL Cholesterol: 73 mg/dL (ref 0–99)
Total CHOL/HDL Ratio: 3 ratio
Triglycerides: 136 mg/dL
VLDL: 27 mg/dL (ref 0–40)

## 2022-12-15 LAB — BASIC METABOLIC PANEL
Anion gap: 10 (ref 5–15)
BUN: 10 mg/dL (ref 8–23)
CO2: 29 mmol/L (ref 22–32)
Calcium: 8.1 mg/dL — ABNORMAL LOW (ref 8.9–10.3)
Chloride: 102 mmol/L (ref 98–111)
Creatinine, Ser: 0.89 mg/dL (ref 0.44–1.00)
GFR, Estimated: >60 mL/min (ref >60)
Glucose, Bld: 96 mg/dL (ref 70–99)
Potassium: 3.8 mmol/L (ref 3.5–5.1)
Sodium: 141 mmol/L (ref 135–145)

## 2022-12-15 IMAGING — CT CT ABD-PELV W/ CM
2 of 5 series · 17 of 46 positions shown, 19 images · IV contrast (APPLIED)
Comparison: None.

CLINICAL DATA: Abdominal pain, fever, nausea, vomiting

EXAM:
CT ABDOMEN AND PELVIS WITH CONTRAST
TECHNIQUE: Multidetector CT imaging of the abdomen and pelvis was performed
using the standard protocol following bolus administration of
intravenous contrast.
CONTRAST:  100mL OMNIPAQUE IOHEXOL 300 MG/ML  SOLN

[Series 3: abdomen 5.0 · axial · 0.98mm/px · z∈[+889,+1329]mm · 14 of 100 slices shown, 16 images]
[im 6/100  soft-tissue]
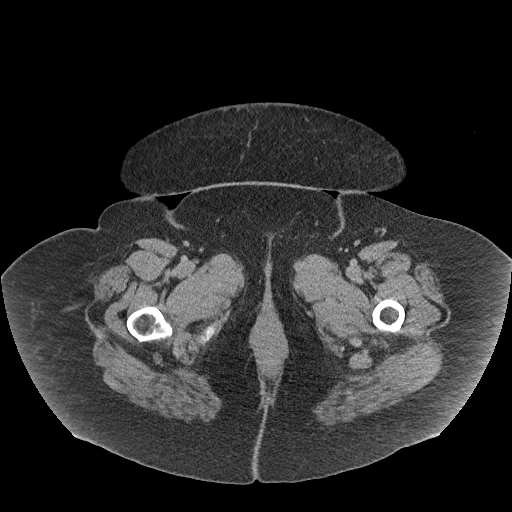
[im 6/100  bone]
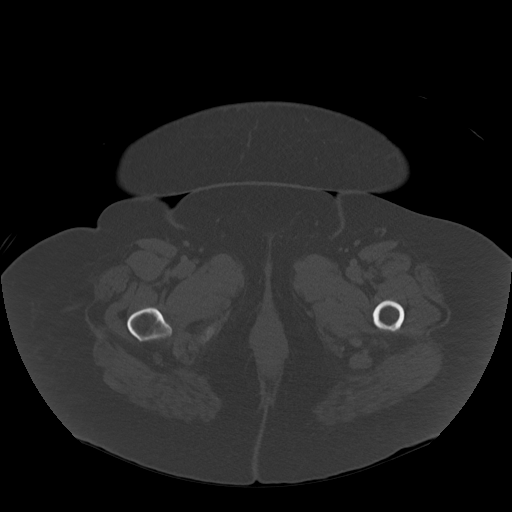
[im 12/100  soft-tissue]
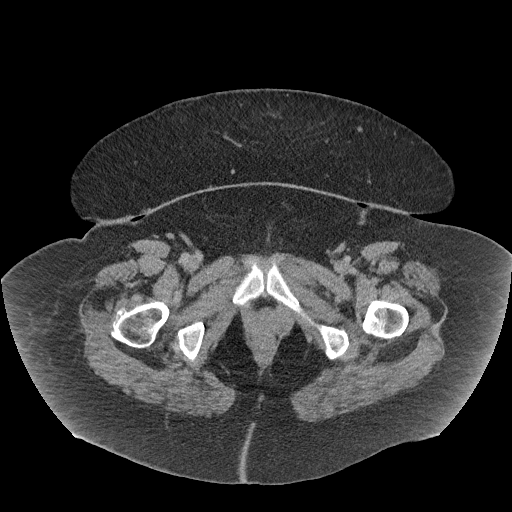
[im 18/100  soft-tissue]
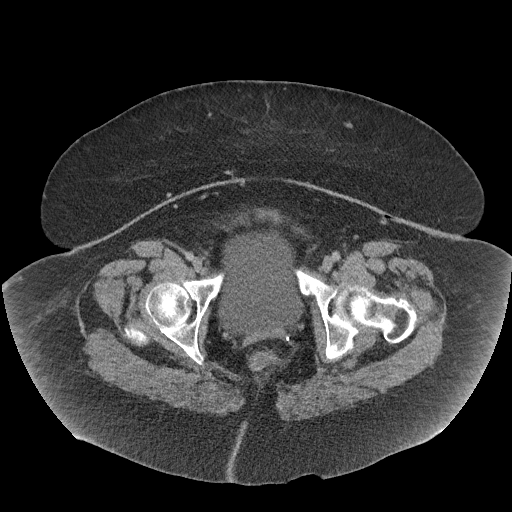
[im 30/100  soft-tissue]
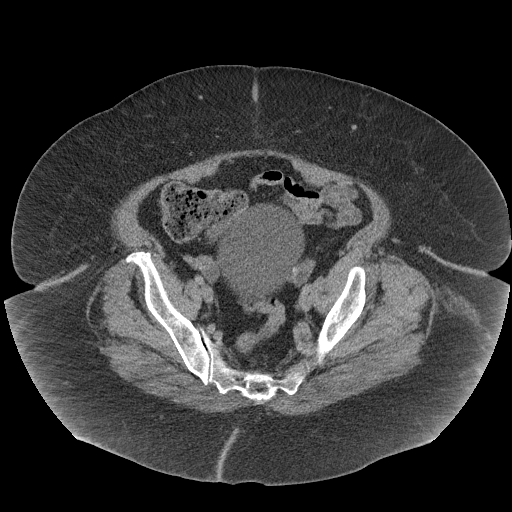
[im 35/100  soft-tissue]
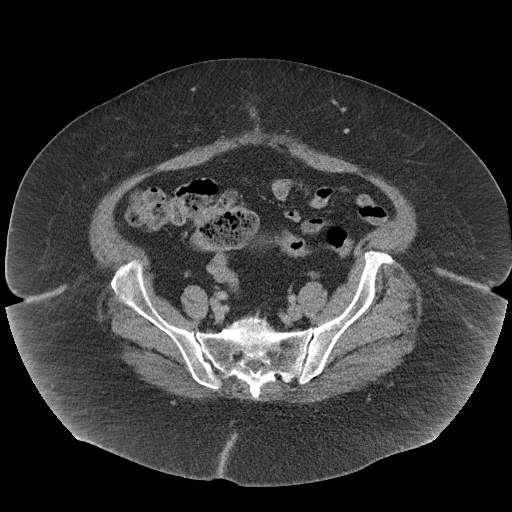
[im 41/100  soft-tissue]
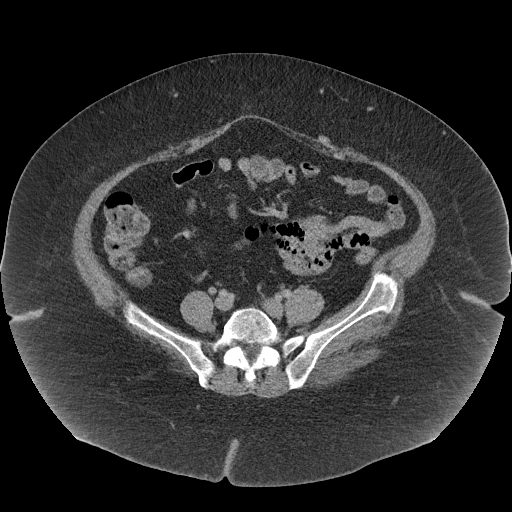
[im 47/100  soft-tissue]
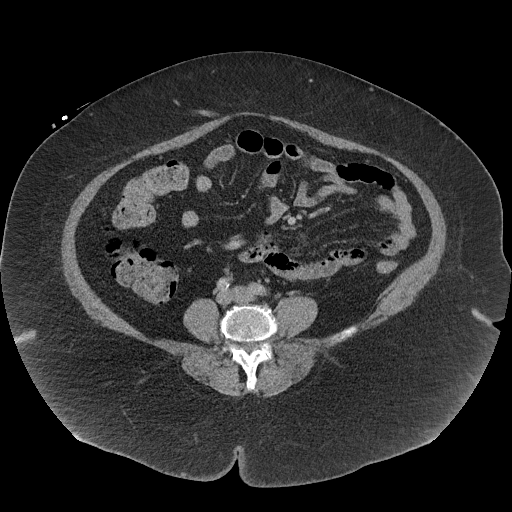
[im 53/100  soft-tissue]
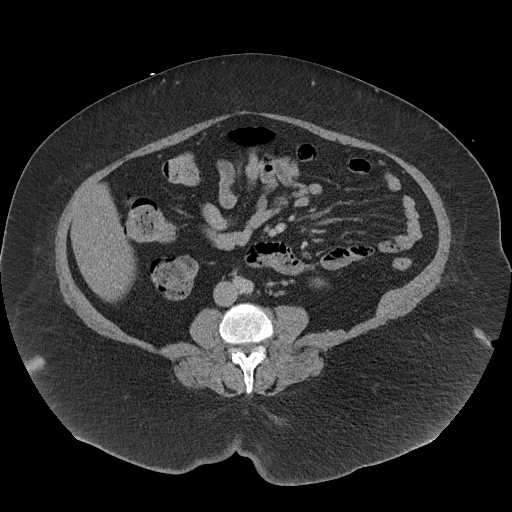
[im 59/100  soft-tissue]
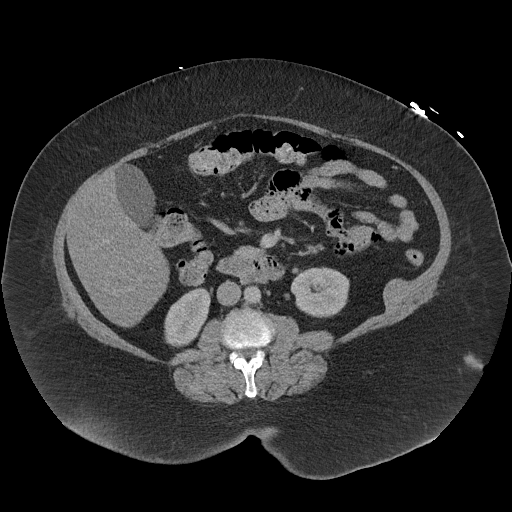
[im 59/100  bone]
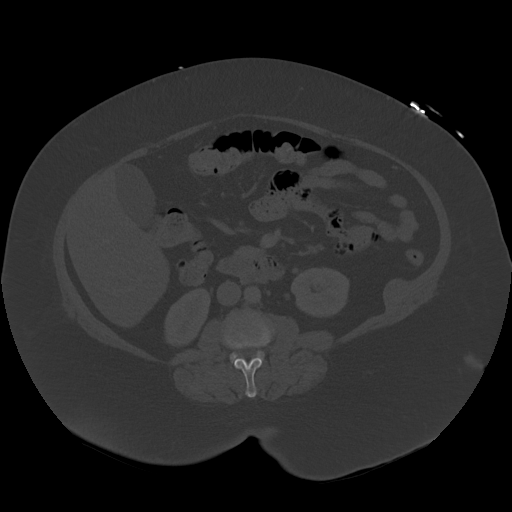
[im 65/100  soft-tissue]
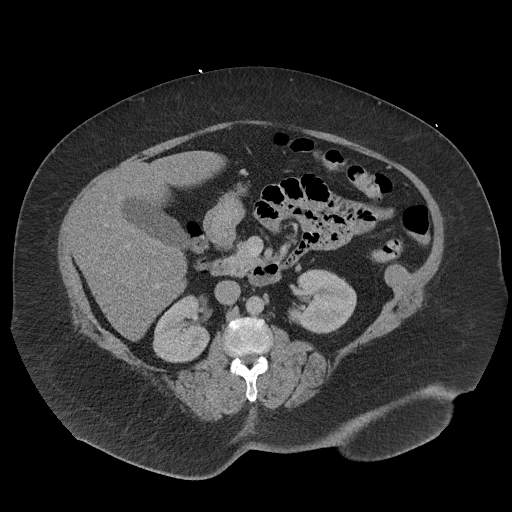
[im 76/100  soft-tissue]
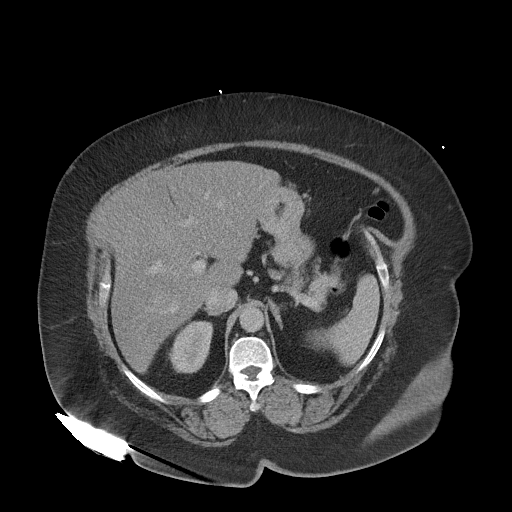
[im 82/100  soft-tissue]
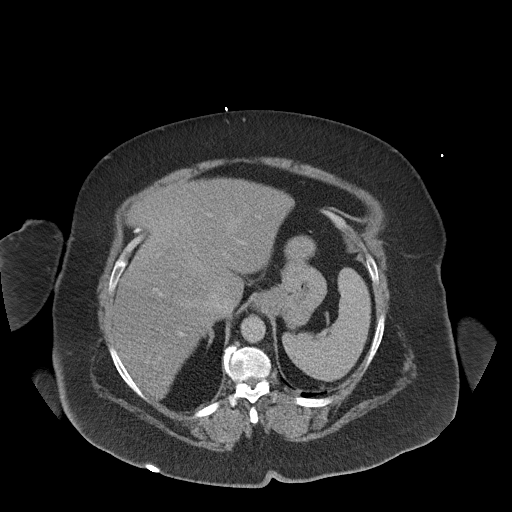
[im 88/100  soft-tissue]
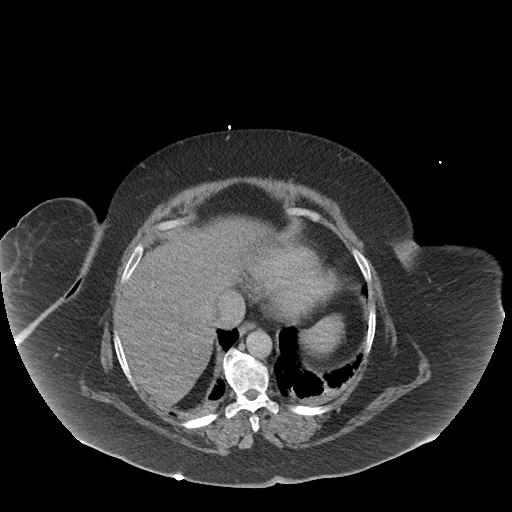
[im 94/100  soft-tissue]
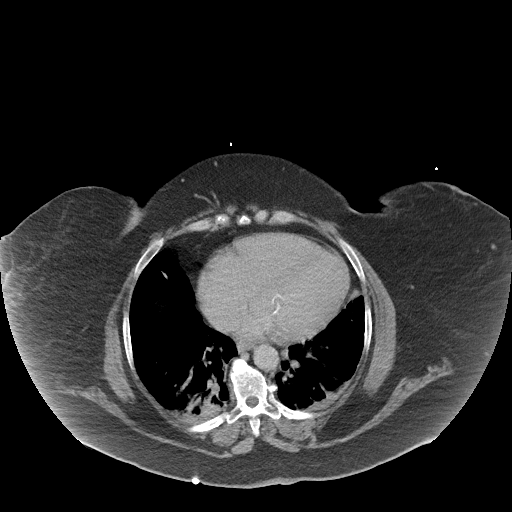

[Series 6: abdomen 3.0 mpr cor · coronal · 1.01mm/px · 3 of 143 slices shown]
[im 48/143  soft-tissue]
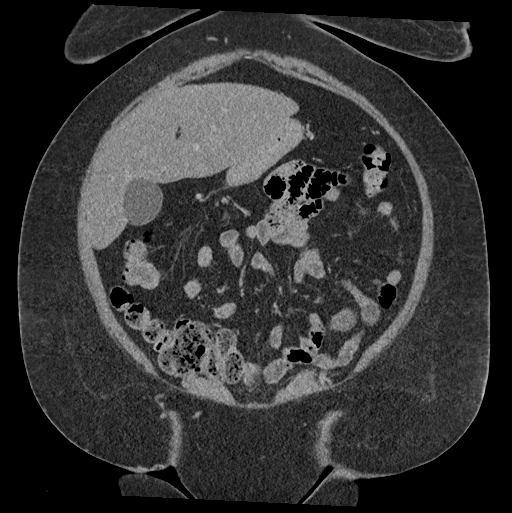
[im 64/143  soft-tissue]
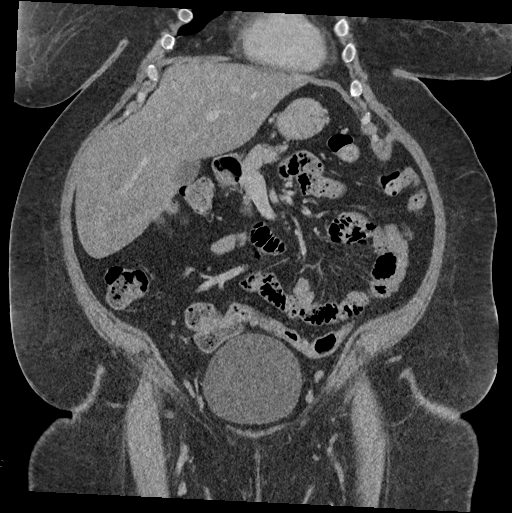
[im 79/143  soft-tissue]
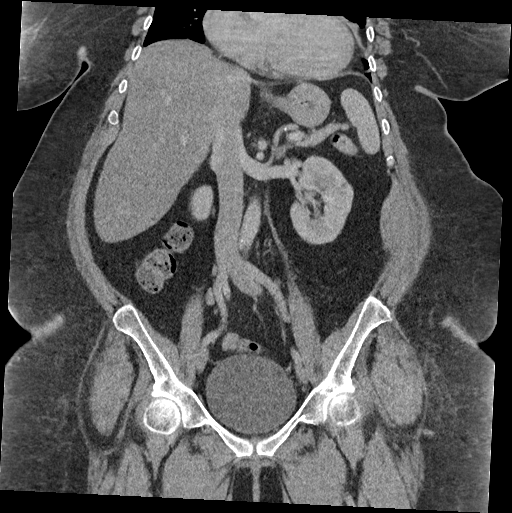

[17 of 46 positions shown; findings below may reference images not displayed]

FINDINGS: Lower chest: Bibasilar atelectasis. The visualized heart and
pericardium are unremarkable.

Hepatobiliary: Moderate hepatic steatosis. Mild hepatomegaly. No
focal enhancing liver lesion. No intra or extrahepatic biliary
ductal dilation. Gallbladder unremarkable.

Pancreas: Unremarkable

Spleen: Unremarkable

Adrenals/Urinary Tract: Adrenal glands are unremarkable. Kidneys are
normal, without renal calculi, focal lesion, or hydronephrosis.
Bladder is unremarkable.

Stomach/Bowel: Stomach is within normal limits. Appendix appears
normal. No evidence of bowel wall thickening, distention, or
inflammatory changes. No free intraperitoneal gas or fluid.

Vascular/Lymphatic: No significant vascular findings are present. No
enlarged abdominal or pelvic lymph nodes.

Reproductive: Status post hysterectomy. No adnexal masses.

Other: Rectum unremarkable.  No abdominal wall hernia.

Musculoskeletal: No acute bone abnormality. No lytic or blastic bone
lesion.
IMPRESSION: No acute intra-abdominal pathology identified. No radiographic
explanation for the patient's reported symptoms.

Moderate hepatic steatosis.

## 2022-12-15 IMAGING — DX DG CHEST 1V
1 series · 1 of 1 positions shown · non-contrast
Comparison: 02/14/2020

CLINICAL DATA: Cough

EXAM:
CHEST  1 VIEW

[chest ap]
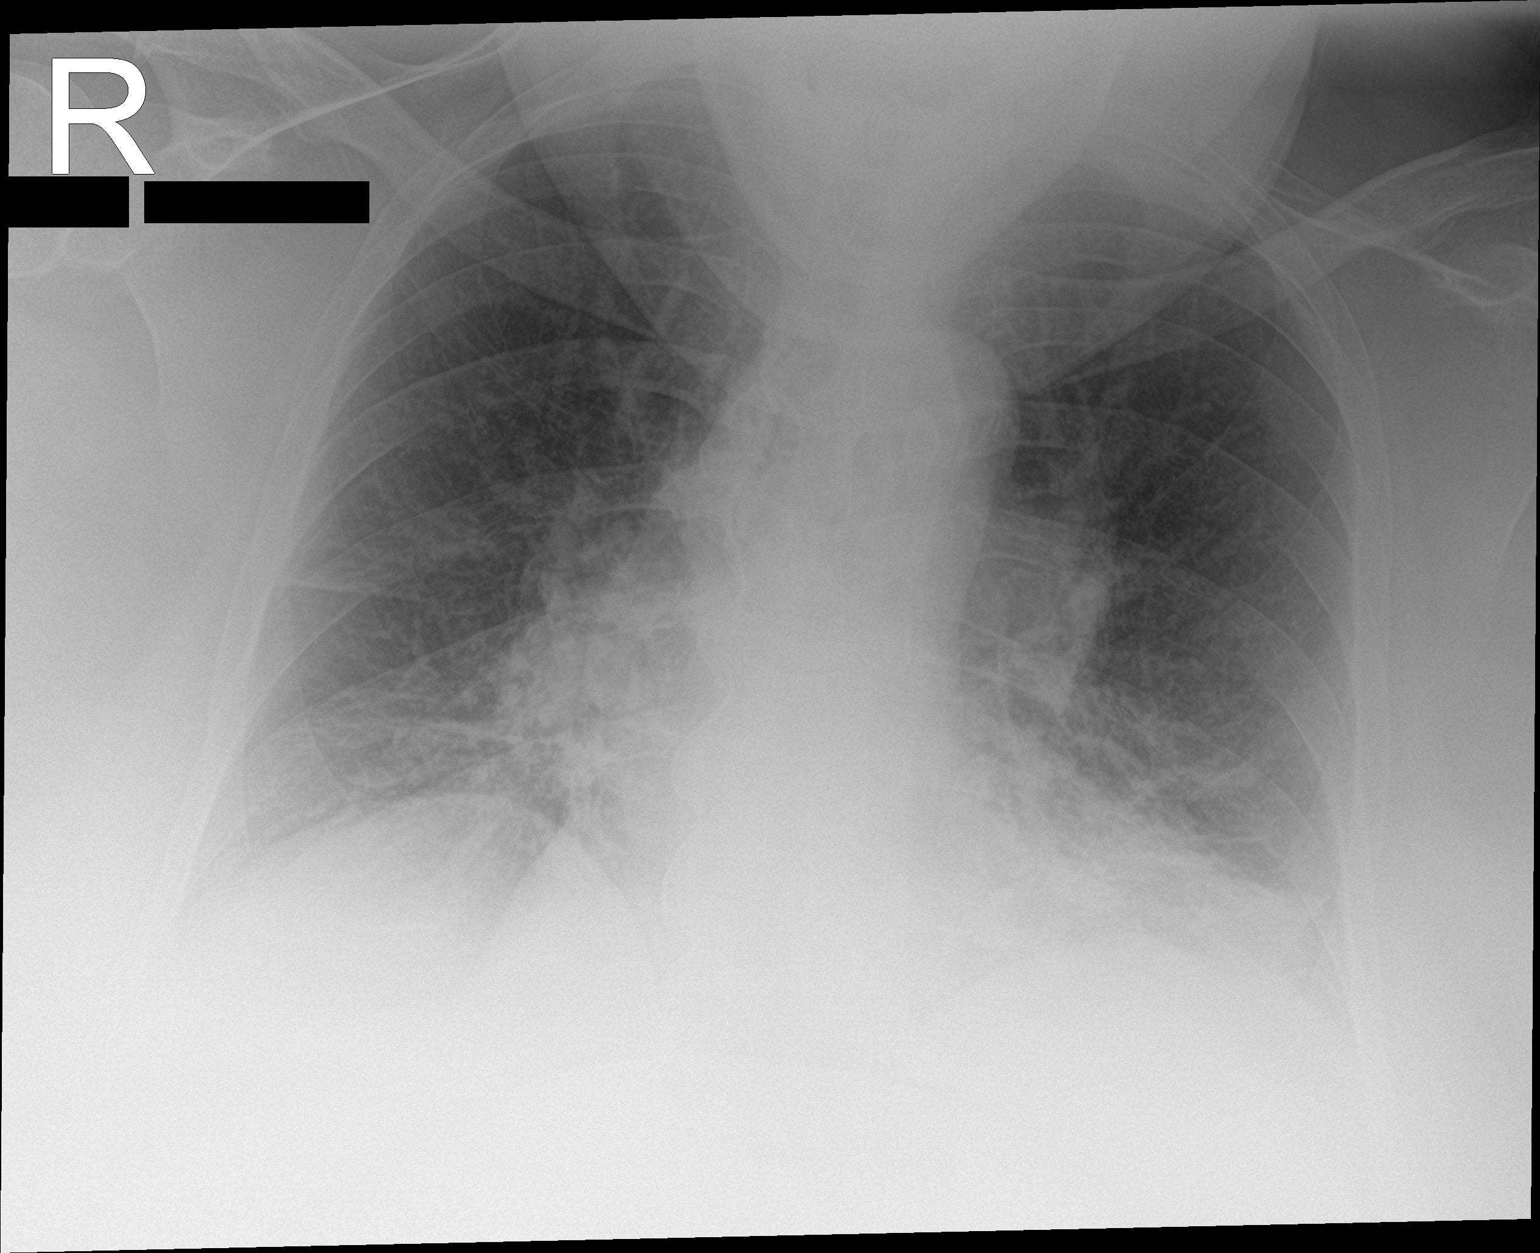

[1 of 1 positions shown; findings below may reference images not displayed]

FINDINGS: Shallow inspiration with infiltration or atelectasis in the lung
bases. No pleural effusions. No pneumothorax. Degenerative changes
in the spine.
IMPRESSION: Shallow inspiration with infiltration or atelectasis in the lung
bases.

## 2022-12-15 MED ORDER — CLONAZEPAM 0.5 MG PO TABS: 1.0000 mg | ORAL_TABLET | Freq: Three times a day (TID) | ORAL | Status: DC

## 2022-12-15 MED ORDER — CLONAZEPAM 0.125 MG PO TBDP
0.5000 mg | ORAL_TABLET | Freq: Once | ORAL | Status: AC
  Administered 2022-12-15: 0.5 mg via ORAL
  Filled 2022-12-15: qty 4

## 2022-12-15 MED ORDER — CLONAZEPAM 0.125 MG PO TBDP: 0.7500 mg | ORAL_TABLET | Freq: Three times a day (TID) | ORAL | Status: DC

## 2022-12-15 MED ORDER — CLONAZEPAM 0.125 MG PO TBDP: 0.5000 mg | ORAL_TABLET | Freq: Three times a day (TID) | ORAL | Status: DC

## 2022-12-15 MED ORDER — CLONAZEPAM 0.5 MG PO TABS
1.5000 mg | ORAL_TABLET | Freq: Three times a day (TID) | ORAL | Status: DC
  Administered 2022-12-15 (×2): 1.5 mg via ORAL
  Administered 2022-12-15: 1 mg via ORAL
  Administered 2022-12-16: 1.5 mg via ORAL
  Filled 2022-12-15 (×4): qty 3

## 2022-12-15 MED ORDER — POTASSIUM CHLORIDE CRYS ER 20 MEQ PO TBCR
20.0000 meq | EXTENDED_RELEASE_TABLET | Freq: Every day | ORAL | Status: DC
  Administered 2022-12-15 – 2022-12-16 (×2): 20 meq via ORAL
  Filled 2022-12-15 (×2): qty 1

## 2022-12-15 MED ORDER — BUTALBITAL-APAP-CAFFEINE 50-325-40 MG PO TABS
1.0000 | ORAL_TABLET | Freq: Two times a day (BID) | ORAL | Status: DC | PRN
  Administered 2022-12-15 – 2022-12-16 (×2): 1 via ORAL
  Filled 2022-12-15 (×2): qty 1

## 2022-12-15 MED ORDER — CLONAZEPAM 0.125 MG PO TBDP: 0.2500 mg | ORAL_TABLET | Freq: Two times a day (BID) | ORAL | Status: DC

## 2022-12-15 MED ORDER — CLONAZEPAM 0.5 MG PO TABS: 2.0000 mg | ORAL_TABLET | Freq: Three times a day (TID) | ORAL | Status: DC

## 2022-12-15 MED ORDER — CLONAZEPAM 0.125 MG PO TBDP: 0.2500 mg | ORAL_TABLET | Freq: Three times a day (TID) | ORAL | Status: DC

## 2022-12-15 MED ORDER — CLONAZEPAM 0.125 MG PO TBDP: 1.2500 mg | ORAL_TABLET | Freq: Three times a day (TID) | ORAL | Status: DC

## 2022-12-15 MED ORDER — ENSURE ENLIVE PO LIQD
237.0000 mL | Freq: Two times a day (BID) | ORAL | Status: DC
  Administered 2022-12-16: 237 mL via ORAL

## 2022-12-15 MED ORDER — CLONAZEPAM 0.125 MG PO TBDP: 0.1250 mg | ORAL_TABLET | Freq: Two times a day (BID) | ORAL | Status: DC

## 2022-12-15 NOTE — Plan of Care (Signed)
  Problem: Coping: Goal: Ability to adjust to condition or change in health will improve Outcome: Progressing   Problem: Fluid Volume: Goal: Ability to maintain a balanced intake and output will improve Outcome: Progressing   Problem: Metabolic: Goal: Ability to maintain appropriate glucose levels will improve Outcome: Progressing   Problem: Nutritional: Goal: Maintenance of adequate nutrition will improve Outcome: Progressing   Problem: Skin Integrity: Goal: Risk for impaired skin integrity will decrease Outcome: Progressing   Problem: Tissue Perfusion: Goal: Adequacy of tissue perfusion will improve Outcome: Progressing   Problem: Clinical Measurements: Goal: Ability to maintain clinical measurements within normal limits will improve Outcome: Progressing Goal: Will remain free from infection Outcome: Progressing   Problem: Activity: Goal: Risk for activity intolerance will decrease Outcome: Progressing   Problem: Nutrition: Goal: Adequate nutrition will be maintained Outcome: Progressing   Problem: Elimination: Goal: Will not experience complications related to bowel motility Outcome: Progressing Goal: Will not experience complications related to urinary retention Outcome: Progressing   Problem: Safety: Goal: Ability to remain free from injury will improve Outcome: Progressing   Problem: Skin Integrity: Goal: Risk for impaired skin integrity will decrease Outcome: Progressing
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
## 2022-12-15 NOTE — Progress Notes (Signed)
   12/14/22 2034  BiPAP/CPAP/SIPAP  BiPAP/CPAP/SIPAP Pt Type Adult  Reason BIPAP/CPAP not in use Other(comment) (prn order, not indicated at this time)  BiPAP/CPAP /SiPAP Vitals  SpO2 94 %  Bilateral Breath Sounds Clear;Diminished

## 2022-12-15 NOTE — TOC Progression Note (Addendum)
Transition of Care [facility]) - Progression Note    Patient Details  Name: Michele Owens MRN: [id number] Date of Birth: [birth year]  [facility] CM/SW Contact  [name], RN Phone Number: 12/15/2022, 11:22 AM  Clinical Narrative:    Spoke to patient regarding BiPap at home to potentially stabilized patient, as well as decrease readmissions. Patient seems to be drifting off at times, words garbled. Discussed the difference between CPAP and Bipap or NIV. I asked her if she would wear it, as she refused it last night. She states that she doesn't have sleep apnea.  Do not have a good sense that she understands the difference. Will speak to respiratory about discussing this with her. Spoke to MD earlier and he did agree that it would be optimum for her to have it. 1130 Contacted [facility] He will speak with her when available 1400 Respiratory did not get a chance to speak with her .Spoke to [place] from [facility]. They will attempt to place her on BiPap tonight and speak to her about the importance of it and how it is different from CPAP. They can then see what her levels of EPAP and IPAP would be. If she refuses BiPap then likely she will not be compliant at home.  1500 Spoke to patient about RW, she states the wheels are broken. She stated she got it a while ago from a nurse named Amy. Will reach out to [employer]. Discussed BiPap again with patient, she did not remember our conversation this morning. Told her that respiratory would be in tonight to discuss and educate further. Reached out to [employer] from [place] regarding BiPap  Expected Discharge Plan: Home/Self Care Barriers to Discharge: Continued Medical Work up  Expected Discharge Plan and Services   Discharge Planning Services: CM Consult   Living arrangements for the past 2 months: Apartment                                       Social Determinants of Health (SDOH) Interventions SDOH Screenings   Food Insecurity:  Food Insecurity Present (12/14/2022)  Housing: Medium Risk (12/14/2022)  Transportation Needs: Unmet Transportation Needs (12/14/2022)  Utilities: Not At Risk (12/14/2022)  Alcohol Screen: Low Risk  (10/24/2022)  Depression (PHQ2-9): Low Risk  (03/21/2021)  Financial Resource Strain: Low Risk  (10/24/2022)  Physical Activity: Inactive (02/01/2021)  Social Connections: Unknown (08/21/2021)   Received from [facility]  Stress: Stress Concern Present (02/23/2021)  Tobacco Use: Medium Risk (12/14/2022)    Readmission Risk Interventions    07/09/2022   10:29 AM 05/18/2022   11:29 AM  Readmission Risk Prevention Plan  Transportation Screening Complete Complete  PCP or Specialist Appt within 3-5 Days Complete   HRI or Home Care Consult Complete Complete  Social Work Consult for Recovery Care Planning/Counseling Complete Complete  Palliative Care Screening Not Applicable Complete  Medication Review Oceanographer) Not Complete Referral to Pharmacy
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
## 2022-12-16 DIAGNOSIS — J9602 Acute respiratory failure with hypercapnia: Secondary | ICD-10-CM | POA: Diagnosis not present

## 2022-12-16 LAB — CBC
HCT: 36.9 % (ref 36.0–46.0)
Hemoglobin: 11.3 g/dL — ABNORMAL LOW (ref 12.0–15.0)
MCH: 29 pg (ref 26.0–34.0)
MCHC: 30.6 g/dL (ref 30.0–36.0)
MCV: 94.9 fL (ref 80.0–100.0)
Platelets: 269 10*3/uL (ref 150–400)
RBC: 3.89 MIL/uL (ref 3.87–5.11)
RDW: 13.1 % (ref 11.5–15.5)
WBC: 10.9 10*3/uL — ABNORMAL HIGH (ref 4.0–10.5)
nRBC: 0 % (ref 0.0–0.2)

## 2022-12-16 LAB — BASIC METABOLIC PANEL
Anion gap: 9 (ref 5–15)
BUN: 9 mg/dL (ref 8–23)
CO2: 31 mmol/L (ref 22–32)
Calcium: 7.7 mg/dL — ABNORMAL LOW (ref 8.9–10.3)
Chloride: 96 mmol/L — ABNORMAL LOW (ref 98–111)
Creatinine, Ser: 0.82 mg/dL (ref 0.44–1.00)
GFR, Estimated: >60 mL/min (ref >60)
Glucose, Bld: 92 mg/dL (ref 70–99)
Potassium: 3.5 mmol/L (ref 3.5–5.1)
Sodium: 136 mmol/L (ref 135–145)

## 2022-12-16 LAB — GLUCOSE, CAPILLARY: Glucose-Capillary: 108 mg/dL — ABNORMAL HIGH (ref 70–99)

## 2022-12-16 MED ORDER — ACETAMINOPHEN 650 MG RE SUPP: 650.0000 mg | Freq: Four times a day (QID) | RECTAL | Status: DC | PRN

## 2022-12-16 MED ORDER — HALOPERIDOL 5 MG PO TABS: 5.0000 mg | ORAL_TABLET | Freq: Every day | ORAL | 0 refills | Status: DC

## 2022-12-16 MED ORDER — ACETAMINOPHEN 500 MG PO TABS: 1000.0000 mg | ORAL_TABLET | Freq: Four times a day (QID) | ORAL | Status: DC | PRN

## 2022-12-16 NOTE — Progress Notes (Signed)
Pt placed on overnight pulseox with 2lt FIO2.
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
## 2022-12-16 NOTE — Plan of Care (Signed)

## 2022-12-16 NOTE — Care Management (Signed)
12:40 Went by patient's room and she was discharged, not in room, housekeeping had already stripped bed.

## 2022-12-16 NOTE — Progress Notes (Signed)
Patient received discharge instructions to include follow up appointments and medications. Patient verbalizes understanding of discharge instructions.

## 2022-12-16 NOTE — Progress Notes (Signed)
Inhaler not in room.  Per pt, she states she is going home today and did not want me to order her the inhaler (offered x 2)- states she has one at home.

## 2022-12-16 NOTE — Plan of Care (Signed)
  Problem: Metabolic: Goal: Ability to maintain appropriate glucose levels will improve Outcome: Progressing   Problem: Nutritional: Goal: Maintenance of adequate nutrition will improve Outcome: Progressing   Problem: Skin Integrity: Goal: Risk for impaired skin integrity will decrease Outcome: Progressing   Problem: Tissue Perfusion: Goal: Adequacy of tissue perfusion will improve Outcome: Progressing   Problem: Clinical Measurements: Goal: Ability to maintain clinical measurements within normal limits will improve Outcome: Progressing Goal: Will remain free from infection Outcome: Progressing Goal: Diagnostic test results will improve Outcome: Progressing Goal: Respiratory complications will improve Outcome: Progressing Goal: Cardiovascular complication will be avoided Outcome: Progressing   Problem: Activity: Goal: Risk for activity intolerance will decrease Outcome: Progressing   Problem: Nutrition: Goal: Adequate nutrition will be maintained Outcome: Progressing   Problem: Elimination: Goal: Will not experience complications related to bowel motility Outcome: Progressing Goal: Will not experience complications related to urinary retention Outcome: Progressing   Problem: Safety: Goal: Ability to remain free from injury will improve Outcome: Progressing   Problem: Skin Integrity: Goal: Risk for impaired skin integrity will decrease Outcome: Progressing   Problem: Education: Goal: Ability to describe self-care measures that may prevent or decrease complications (Diabetes Survival Skills Education) will improve Outcome: Not Progressing   Problem: Coping: Goal: Ability to adjust to condition or change in health will improve Outcome: Not Progressing   Problem: Fluid Volume: Goal: Ability to maintain a balanced intake and output will improve Outcome: Not Progressing   Problem: Health Behavior/Discharge Planning: Goal: Ability to identify and utilize  available resources and services will improve Outcome: Not Progressing Goal: Ability to manage health-related needs will improve Outcome: Not Progressing   Problem: Nutritional: Goal: Progress toward achieving an optimal weight will improve Outcome: Not Progressing   Problem: Education: Goal: Knowledge of General Education information will improve Description: Including pain rating scale, medication(s)/side effects and non-pharmacologic comfort measures Outcome: Not Progressing   Problem: Health Behavior/Discharge Planning: Goal: Ability to manage health-related needs will improve Outcome: Not Progressing   Problem: Coping: Goal: Level of anxiety will decrease Outcome: Not Progressing   Problem: Pain Managment: Goal: General experience of comfort will improve Outcome: Not Progressing

## 2022-12-16 NOTE — Progress Notes (Signed)
Physical Therapy Brief Evaluation and Discharge Note Patient Details Name: Michele Owens MRN: [id number] DOB: 11-13-1960 Today's Date: 12/16/2022   History of Present Illness  Pt is a 62 y/o F presenting to ED on 9/4 with bradycardia, noted to be significantly hypoxic requiring bipap. Admitted for acute respiratory failure with hypercapnea. PMH includes HTN, DM2, COPD, chronic hypoxic respiratory failure, CHF, hypothyroidism, opioid dependence, anxiety  Clinical Impression  Pt admitted with above diagnosis. Pt without signficant functional limitations at this time and is Modif I with cane with no challenges and uses RW with min challenges to balance with Modif I.  Pt adamant she is leaving and going home.  Recommend HHPT and RW (which was ordered already and in the room).   Pt will benefit from acute skilled PT to increase their independence and safety with mobility to allow discharge.          PT Assessment All further PT needs can be met in the next venue of care  Assistance Needed at Discharge  None    Equipment Recommendations Rolling walker (2 wheels)  Recommendations for Other Services       Precautions/Restrictions Precautions Precautions: Fall Restrictions Weight Bearing Restrictions: No Other Position/Activity Restrictions: 2L O2 baseline        Mobility  Bed Mobility Rolling: Independent        Transfers Overall transfer level: Needs assistance Equipment used: Rolling walker (2 wheels) Transfers: Sit to/from Stand Sit to Stand: Modified independent (Device/Increase time)           General transfer comment: Pt able to stand to feet with use of cane.    Ambulation/Gait Ambulation/Gait assistance: Modified independent (Device/Increase time) Gait Distance (Feet): 200 Feet Assistive device: Straight cane, Rolling walker (2 wheels) Gait Pattern/deviations: Step-through pattern, Decreased stride length, Drifts right/left   General Gait Details: Pt was  able  to ambulate with cane and rW.  Pt drifts wtih cane without significant LOB.  Pt did better with walking with RW as she is safer and she is aware and agreeable to use the RW.  Home Activity Instructions    Stairs            Modified Rankin (Stroke Patients Only)        Balance   Sitting-balance support: Feet supported Sitting balance-Leahy Scale: Good     Standing balance support: During functional activity, Reliant on assistive device for balance Standing balance-Leahy Scale: Poor Standing balance comment: reliant on RW support          Pertinent Vitals/Pain PT - Brief Vital Signs All Vital Signs Stable: Yes Pain Assessment Pain Assessment: No/denies pain     Home Living Family/patient expects to be discharged to:: Private residence Living Arrangements: Alone Available Help at Discharge: Available PRN/intermittently Home Environment: Level entry   Home Equipment: Grab bars - tub/shower;Rolling Walker (2 wheels);Cane - single point;Shower seat;BSC/3in1;Rollator (4 wheels)   Additional Comments: has an aide M-F for 2 hours; wears 2L O2 baseline    Prior Function Level of Independence: Independent with assistive device(s)      UE/LE Assessment   UE ROM/Strength/Tone/Coordination: WFL    LE ROM/Strength/Tone/Coordination: [facility]      Communication   Communication Communication: No apparent difficulties     Cognition Overall Cognitive Status: History of cognitive impairments - at baseline       General Comments General comments (skin integrity, edema, etc.): VSS on 2LO2    Exercises     Assessment/Plan    PT Problem  List Decreased activity tolerance;Decreased balance;Decreased mobility;Decreased knowledge of use of DME;Decreased safety awareness;Decreased knowledge of precautions       PT Visit Diagnosis Muscle weakness (generalized) (M62.81)    No Skilled PT     Co-evaluation                AMPAC 6 Clicks Help needed turning from  your back to your side while in a flat bed without using bedrails?: None Help needed moving from lying on your back to sitting on the side of a flat bed without using bedrails?: None Help needed moving to and from a bed to a chair (including a wheelchair)?: A Little Help needed standing up from a chair using your arms (e.g., wheelchair or bedside chair)?: A Little Help needed to walk in hospital room?: A Little Help needed climbing 3-5 steps with a railing? : A Lot 6 Click Score: 19      End of Session Equipment Utilized During Treatment: Gait belt;Oxygen Activity Tolerance: Patient tolerated treatment well Patient left: in bed;with call bell/phone within reach Nurse Communication: Mobility status PT Visit Diagnosis: Muscle weakness (generalized) (M62.81)     Time: 1106-1130 PT Time Calculation (min) (ACUTE ONLY): 24 min  Charges:   PT Evaluation $PT Eval Low Complexity: 1 Low      Creola Krotz M,PT Acute Rehab Services [phone]   Bevelyn Buckles  12/16/2022, 11:43 AM

## 2022-12-16 NOTE — Discharge Instructions (Signed)
Advised to follow-up with psychiatry as scheduled. Advised to continue current medications. Patient was offered skilled nursing facility patient has declined.

## 2022-12-16 NOTE — Evaluation (Signed)
Occupational Therapy Evaluation Patient Details Name: Michele Owens MRN: [id number] DOB: July 17, 1960 Today's Date: 12/16/2022   History of Present Illness Pt is a 62 y/o F presenting to ED on 9/4 with bradycardia, noted to be significantly hypoxic requiring bipap. Admitted for acute respiratory failure with hypercapnea. PMH includes HTN, DM2, COPD, chronic hypoxic respiratory failure, CHF, hypothyroidism, opioid dependence, anxiety   Clinical Impression   Pt reports ind at baseline with ADLs, lives alone and has an aide that comes M-F for 2 hours/day. Pt uses cane at baseline in the home, and rollator for community mobility. Pt currently needing CGA-mod A for ADLs, CGA for transfers with RW. Pt reports L knee pain (chronic), during session, VSS on baseline 2L O2 throughout. Pt presenting with impairments listed below, will follow acutely. Recommend HHOT at d/c.      If plan is discharge home, recommend the following: A little help with walking and/or transfers;A little help with bathing/dressing/bathroom;Assistance with cooking/housework;Direct supervision/assist for medications management;Direct supervision/assist for financial management;Assist for transportation;Help with stairs or ramp for entrance    Functional Status Assessment  Patient has had a recent decline in their functional status and demonstrates the ability to make significant improvements in function in a reasonable and predictable amount of time.  Equipment Recommendations  Tub/shower seat    Recommendations for Other Services PT consult     Precautions / Restrictions Precautions Precautions: Fall Restrictions Weight Bearing Restrictions: No Other Position/Activity Restrictions: 2L O2 baseline      Mobility Bed Mobility               General bed mobility comments: EOB upon arrival    Transfers Overall transfer level: Needs assistance Equipment used: Rolling walker (2 wheels) Transfers: Sit to/from  Stand Sit to Stand: Contact guard assist                  Balance Overall balance assessment: Needs assistance Sitting-balance support: Feet supported Sitting balance-Leahy Scale: Good     Standing balance support: During functional activity, Reliant on assistive device for balance Standing balance-Leahy Scale: Poor Standing balance comment: reliant on RW support                           ADL either performed or assessed with clinical judgement   ADL Overall ADL's : Needs assistance/impaired Eating/Feeding: Set up   Grooming: Contact guard assist   Upper Body Bathing: Minimal assistance   Lower Body Bathing: Moderate assistance   Upper Body Dressing : Minimal assistance   Lower Body Dressing: Minimal assistance   Toilet Transfer: Contact guard assist;Ambulation;Rolling walker (2 wheels)   Toileting- Clothing Manipulation and Hygiene: Contact guard assist       Functional mobility during ADLs: Contact guard assist;Rolling walker (2 wheels)       Vision Baseline Vision/History: 1 Wears glasses Vision Assessment?: No apparent visual deficits     Perception Perception: Not tested       Praxis Praxis: Not tested       Pertinent Vitals/Pain Pain Assessment Pain Assessment: Faces Pain Score: 2  Faces Pain Scale: Hurts a little bit Pain Location: L knee Pain Descriptors / Indicators: Discomfort Pain Intervention(s): Limited activity within patient's tolerance, Monitored during session, Repositioned     Extremity/Trunk Assessment Upper Extremity Assessment Upper Extremity Assessment: Generalized weakness   Lower Extremity Assessment Lower Extremity Assessment: Defer to PT evaluation   Cervical / Trunk Assessment Cervical / Trunk Assessment: Kyphotic  Communication Communication Communication: No apparent difficulties   Cognition Arousal: Alert Behavior During Therapy: WFL for tasks assessed/performed Overall Cognitive Status: Within  Functional Limits for tasks assessed                                 General Comments: tangential in conversation at times     General Comments  VSS On 2L O2    Exercises     Shoulder Instructions      Home Living Family/patient expects to be discharged to:: Private residence Living Arrangements: Alone Available Help at Discharge: Personal care attendant Type of Home: Apartment Home Access: Level entry     Home Layout: One level     Bathroom Shower/Tub: Tub/shower unit         Home Equipment: Grab bars - tub/shower;Rolling Environmental consultant (2 wheels);Cane - single point;Shower seat;BSC/3in1;Rollator (4 wheels)   Additional Comments: has an aide M-F for 2 hours; wears 2L O2 baseline      Prior Functioning/Environment Prior Level of Function : Needs assist             Mobility Comments: uses cane in the house, rollator outside ADLs Comments: aide assists with bathing, household tasks, uses SCAT/lyft for transportation        OT Problem List: Decreased strength;Decreased range of motion;Decreased activity tolerance;Impaired balance (sitting and/or standing);Decreased safety awareness;Cardiopulmonary status limiting activity      OT Treatment/Interventions: Therapeutic exercise;Self-care/ADL training;Energy conservation;DME and/or AE instruction;Therapeutic activities;Balance training;Patient/family education    OT Goals(Current goals can be found in the care plan section) Acute Rehab OT Goals Patient Stated Goal: none stated OT Goal Formulation: With patient Time For Goal Achievement: 12/30/22 Potential to Achieve Goals: Good ADL Goals Pt Will Perform Upper Body Dressing: with modified independence;sitting Pt Will Perform Lower Body Dressing: with modified independence;sit to/from stand;sitting/lateral leans Pt Will Transfer to Toilet: with modified independence;ambulating;regular height toilet Pt Will Perform Tub/Shower Transfer: Tub transfer;Shower  transfer;with modified independence;ambulating;shower seat Additional ADL Goal #1: pt will verbalize x3 energy conservation strategies in prep for ADLs  OT Frequency: Min 1X/week    Co-evaluation              AM-PAC OT "6 Clicks" Daily Activity     Outcome Measure Help from another person eating meals?: None Help from another person taking care of personal grooming?: A Little Help from another person toileting, which includes using toliet, bedpan, or urinal?: A Little Help from another person bathing (including washing, rinsing, drying)?: A Lot Help from another person to put on and taking off regular upper body clothing?: A Little Help from another person to put on and taking off regular lower body clothing?: A Little 6 Click Score: 18   End of Session Equipment Utilized During Treatment: Rolling walker (2 wheels);Oxygen (2L) Nurse Communication: Mobility status  Activity Tolerance: Patient tolerated treatment well Patient left: in bed;with call bell/phone within reach  OT Visit Diagnosis: Unsteadiness on feet (R26.81);Muscle weakness (generalized) (M62.81);Other abnormalities of gait and mobility (R26.89)                Time: 9147-8295 OT Time Calculation (min): 23 min Charges:  OT General Charges $OT Visit: 1 Visit OT Evaluation $OT Eval Moderate Complexity: 1 Mod OT Treatments $Therapeutic Activity: 8-22 mins  Carver Fila, OTD, OTR/L SecureChat Preferred Acute Rehab [phone]   Carver Fila Koonce 12/16/2022, 9:19 AM

## 2022-12-16 NOTE — TOC Transition Note (Signed)
Transition of Care [facility]) - CM/SW Discharge Note   Patient Details  Name: Michele Owens MRN: [id number] Date of Birth: Sep 18, 1960  [facility] of [facility] CM/SW Contact:  Helene Kelp, LCSW Phone Number: 12/16/2022, 12:09 PM   Clinical Narrative:    TOC CSW was notified by the CN the patient was in need of transportation assistance to return home, post this hospital admission.   CSW  spoke with the MD, regarding pt discharge needs. This Clinical research associate updated the MD to the CM regarding DME needs followed-up/completed the day before or if there case-manger needs of disposition support.   No other needs identified of this Clinical research associate. Please contact TOC if there is additional disposition support needed.      Final next level of care: Other (comment) (Discharge home) Barriers to Discharge: Continued Medical Work up   Patient Goals and CMS Choice CMS Medicare.gov Compare Post Acute Care list provided to:: Patient    Discharge Placement                      Patient and family notified of of transfer: 12/16/22  Discharge Plan and Services Additional resources added to the After Visit Summary for     Discharge Planning Services: CM Consult            DME Arranged: Dan Humphreys rolling, Bipap DME Agency: Patsy Lager Date DME Agency Contacted: 12/15/22 Time DME Agency Contacted: [phone] Representative spoke with at DME Agency: Eusebio Me            Social Determinants of Health (SDOH) Interventions SDOH Screenings   Food Insecurity: Food Insecurity Present (12/14/2022)  Housing: Medium Risk (12/14/2022)  Transportation Needs: Unmet Transportation Needs (12/14/2022)  Utilities: Not At Risk (12/14/2022)  Alcohol Screen: Low Risk  (10/24/2022)  Depression (PHQ2-9): Low Risk  (03/21/2021)  Financial Resource Strain: Low Risk  (10/24/2022)  Physical Activity: Inactive (02/01/2021)  Social Connections: Unknown (08/21/2021)   Received from [facility]  Stress: Stress Concern Present  (02/23/2021)  Tobacco Use: Medium Risk (12/14/2022)     Readmission Risk Interventions    07/09/2022   10:29 AM 05/18/2022   11:29 AM  Readmission Risk Prevention Plan  Transportation Screening Complete Complete  PCP or Specialist Appt within 3-5 Days Complete   HRI or Home Care Consult Complete Complete  Social Work Consult for Recovery Care Planning/Counseling Complete Complete  Palliative Care Screening Not Applicable Complete  Medication Review Oceanographer) Not Complete Referral to Pharmacy

## 2022-12-21 ENCOUNTER — Other Ambulatory Visit (HOSPITAL_COMMUNITY): Payer: Self-pay | Admitting: Cardiology

## 2022-12-23 IMAGING — DX DG CHEST 1V
1 series · 1 of 1 positions shown · non-contrast
Comparison: Radiograph 04/10/2020 CT abdomen pelvis 04/10/2020 for
comparison the lung bases, radiograph 02/15/2020

CLINICAL DATA: Recent pneumonia

EXAM:
CHEST  1 VIEW

[chest ap]
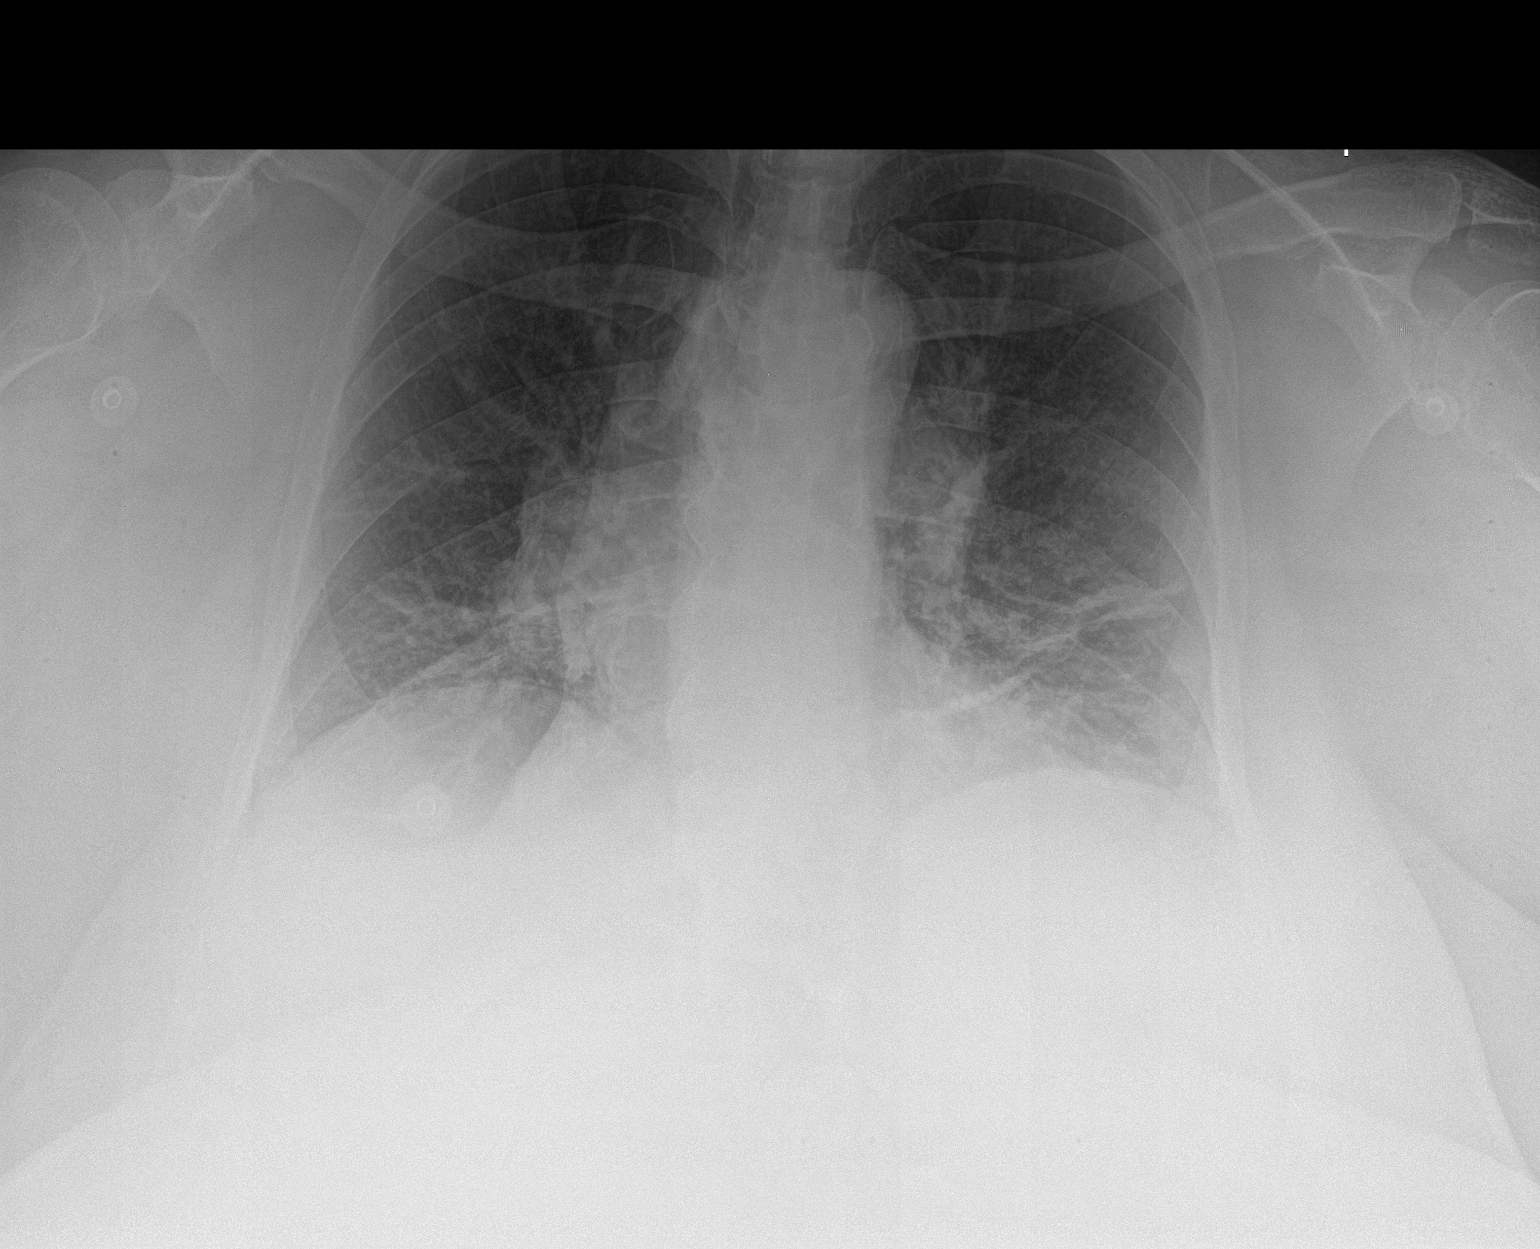

[1 of 1 positions shown; findings below may reference images not displayed]

FINDINGS: Persistent mixed patchy and linear opacities in the lung bases, some
of which is likely reflect a combination of scarring and atelectasis
though residual infection/airspace disease is not fully excluded. No
new consolidative opacities or convincing features of edema. No
pneumothorax or visible effusion. Stable cardiomediastinal contours
with a calcified aorta. No acute osseous or soft tissue abnormality.
Degenerative changes are present in the imaged spine and shoulders.
IMPRESSION: Persistent mixed patchy and linear opacities in the lung bases, some
of which is likely reflect a combination of scarring and atelectasis
though residual infection/airspace disease is not fully excluded.

## 2022-12-23 IMAGING — CT CT HEAD W/O CM
3 series · 16 of 46 positions shown, 19 images · non-contrast
Comparison: Head CT 06/28/2019

CLINICAL DATA: Mental status change, unknown cause altered mental

EXAM:
CT HEAD WITHOUT CONTRAST
TECHNIQUE: Contiguous axial images were obtained from the base of the skull
through the vertex without intravenous contrast.

[Series 3: head 5.0 h30s · axial · 0.44mm/px · z∈[+158,+278]mm · 10 of 29 slices shown, 13 images]
[im 3/29  brain]
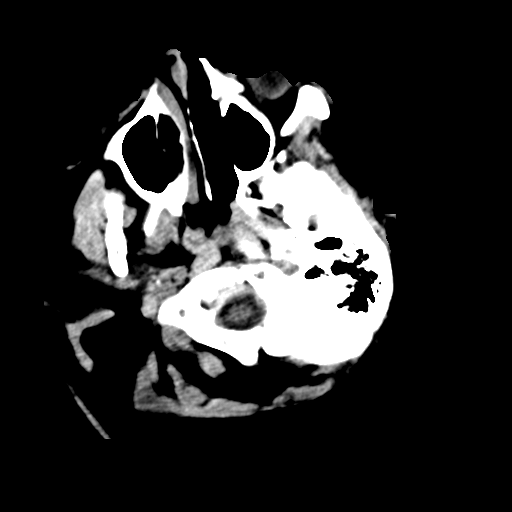
[im 3/29  bone]
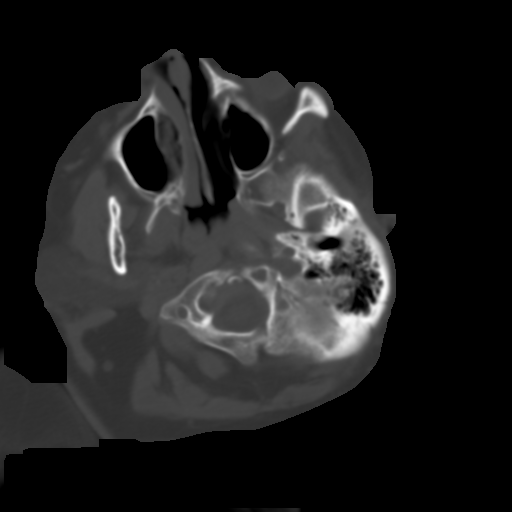
[im 6/29  brain]
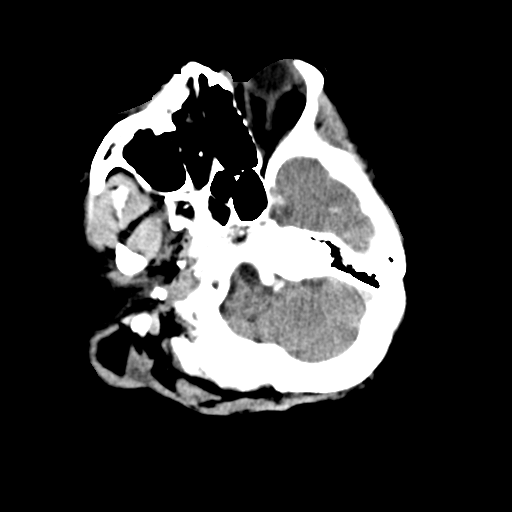
[im 8/29  brain]
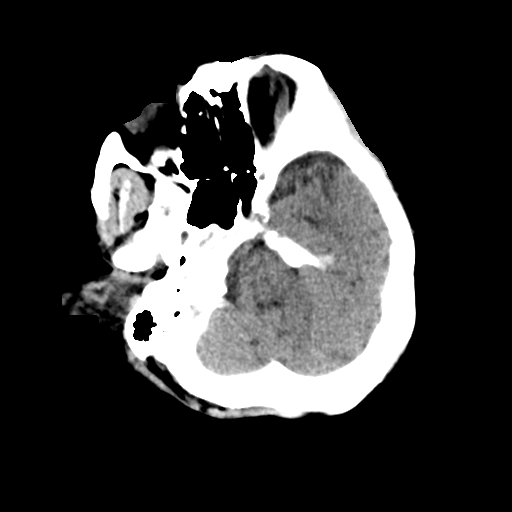
[im 11/29  brain]
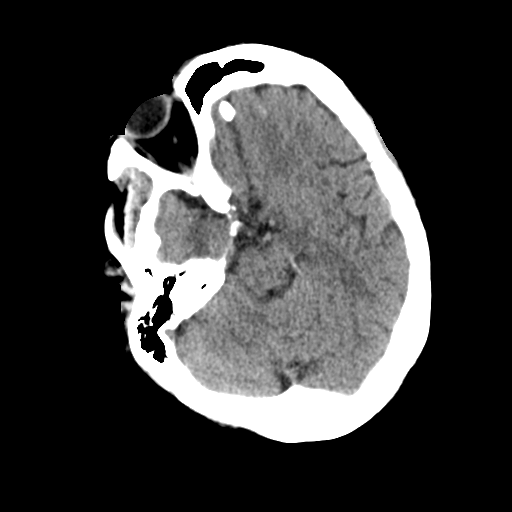
[im 14/29  brain]
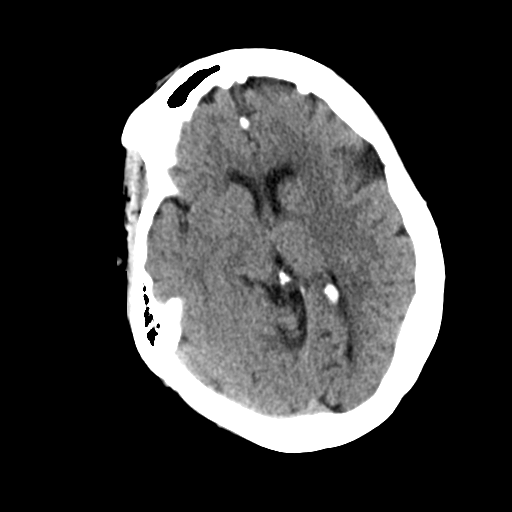
[im 14/29  bone]
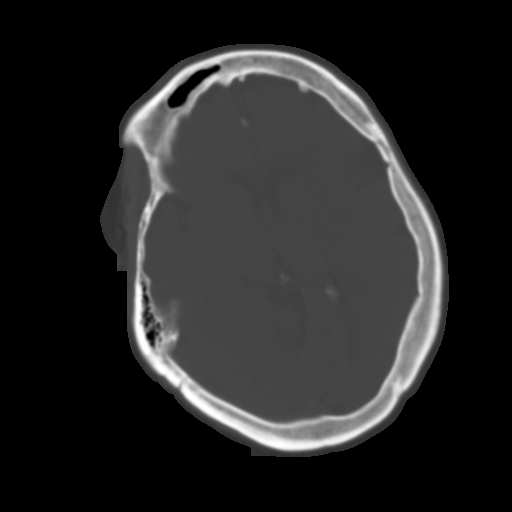
[im 16/29  brain]
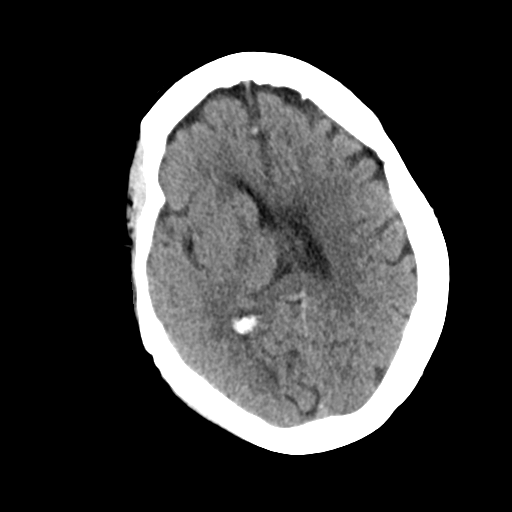
[im 19/29  brain]
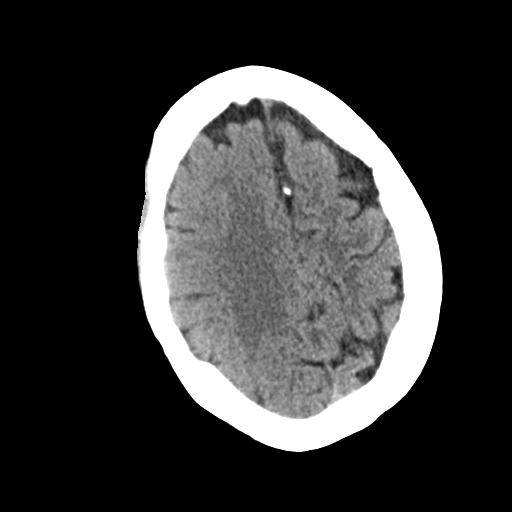
[im 22/29  brain]
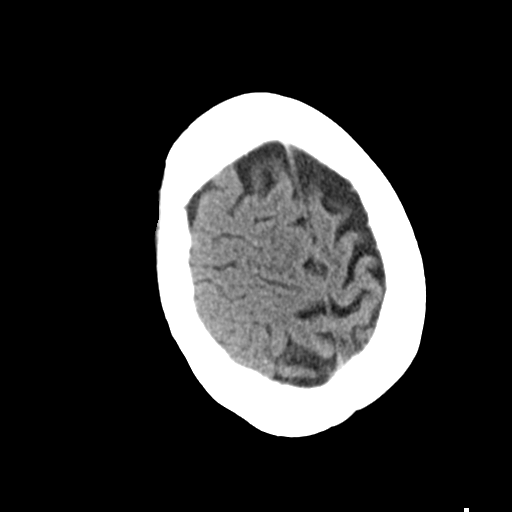
[im 24/29  brain]
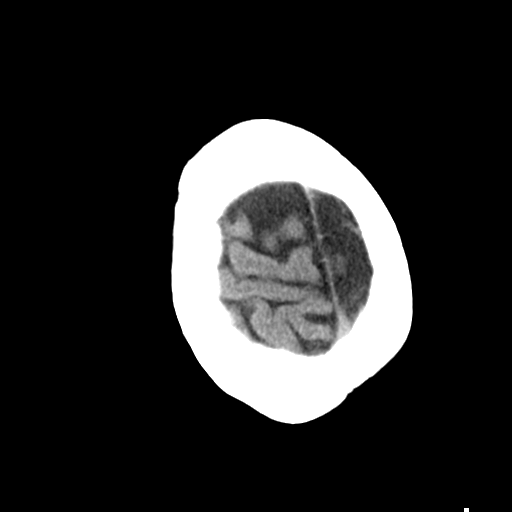
[im 24/29  bone]
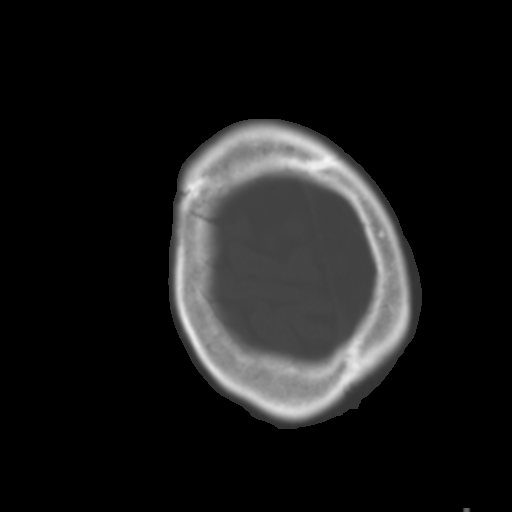
[im 27/29  brain]
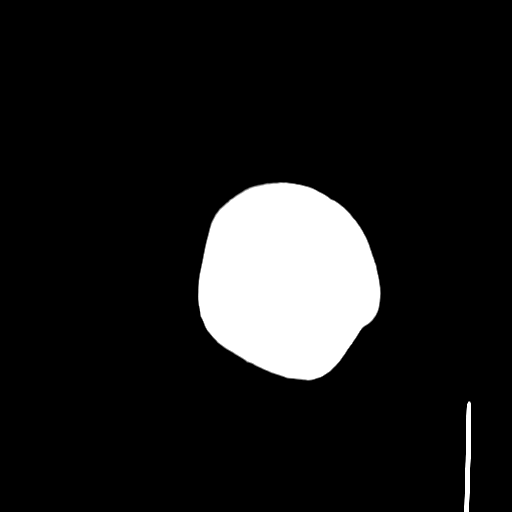

[Series 5: head 3.0 mpr cor · coronal · 0.34mm/px · 3 of 72 slices shown]
[im 24/72  brain]
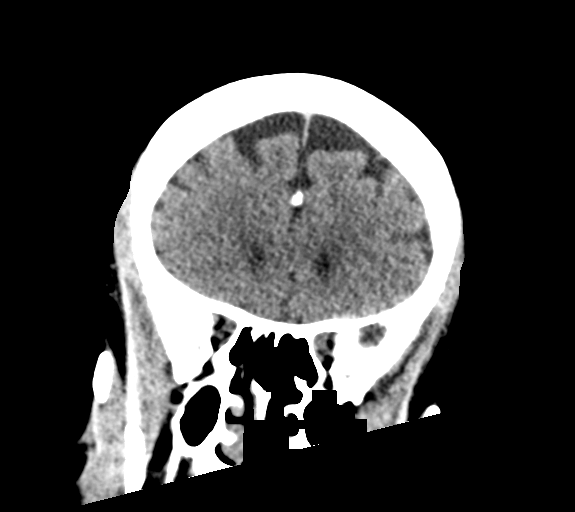
[im 32/72  brain]
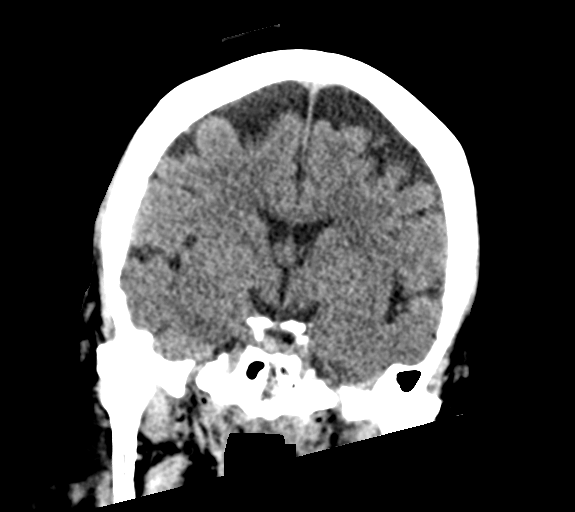
[im 40/72  brain]
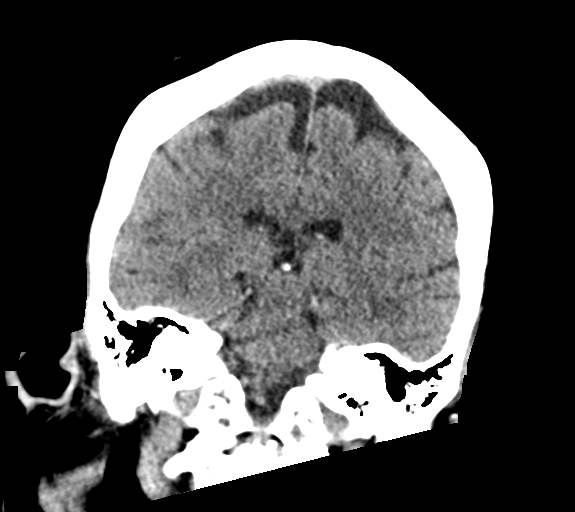

[Series 6: head 3.0 mpr sag · sagittal · 0.34mm/px · 3 of 63 slices shown]
[im 27/63  brain]
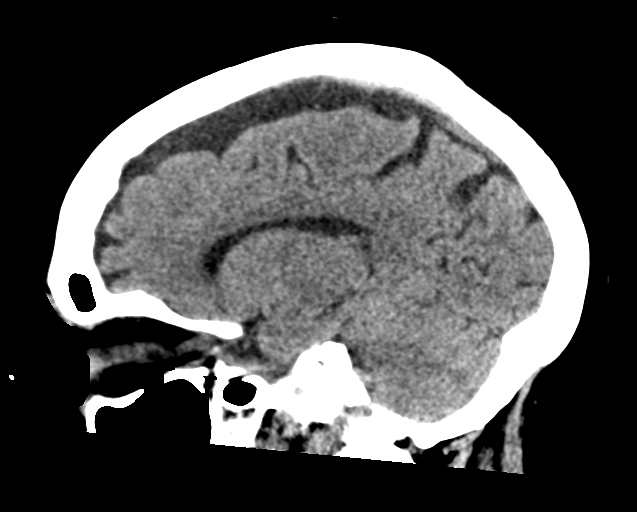
[im 32/63  brain]
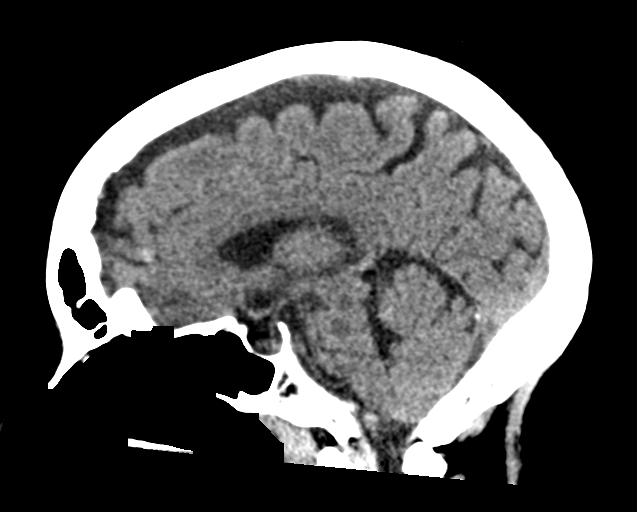
[im 36/63  brain]
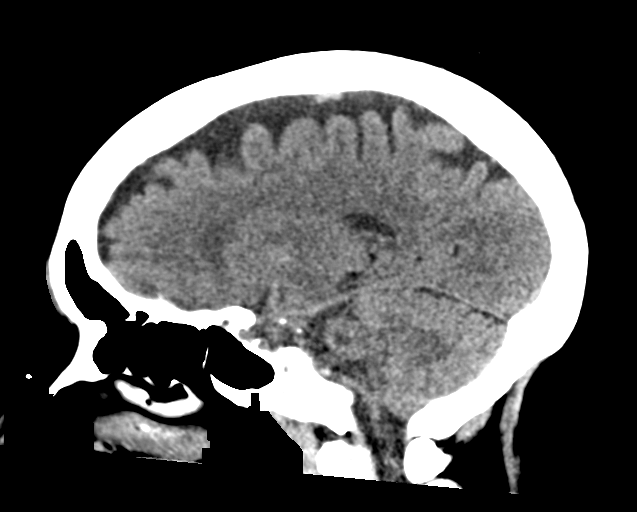

[16 of 46 positions shown; findings below may reference images not displayed]

FINDINGS: Brain: Patient is rotated in the scanner, cannot lay flat for
imaging. No intracranial hemorrhage, mass effect, or midline shift.
No hydrocephalus. The basilar cisterns are patent. No evidence of
territorial infarct or acute ischemia. No extra-axial or
intracranial fluid collection.

Vascular: Atherosclerosis of skullbase vasculature without
hyperdense vessel or abnormal calcification.

Skull: No fracture or focal lesion.  Mild frontal hyperostosis.

Sinuses/Orbits: Paranasal sinuses and mastoid air cells are clear.
The visualized orbits are unremarkable.

Other: None.
IMPRESSION: No acute intracranial abnormality.

## 2022-12-23 IMAGING — CT CT ANGIO CHEST
2 of 6 series · 18 of 36 positions shown · IV contrast (omnipaque)
Comparison: Chest CT angiogram dated 06/28/2019.

CLINICAL DATA: Hemoptysis.  Recent admission for pneumonia.

EXAM:
CT ANGIOGRAPHY CHEST WITH CONTRAST
TECHNIQUE: Multidetector CT imaging of the chest was performed using the
standard protocol during bolus administration of intravenous
contrast. Multiplanar CT image reconstructions and MIPs were
obtained to evaluate the vascular anatomy.
CONTRAST:  75mL OMNIPAQUE IOHEXOL 350 MG/ML SOLN

[Series 6: pe thins · axial · 0.84mm/px · z∈[+1132,+1372]mm · 17 of 181 slices shown]
[im 11/181  lung]
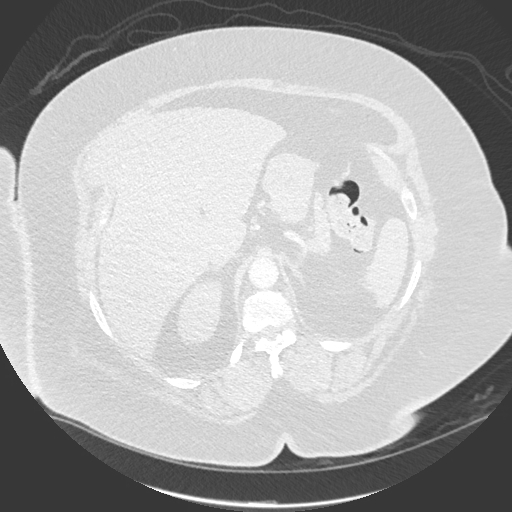
[im 21/181  mediastinal]
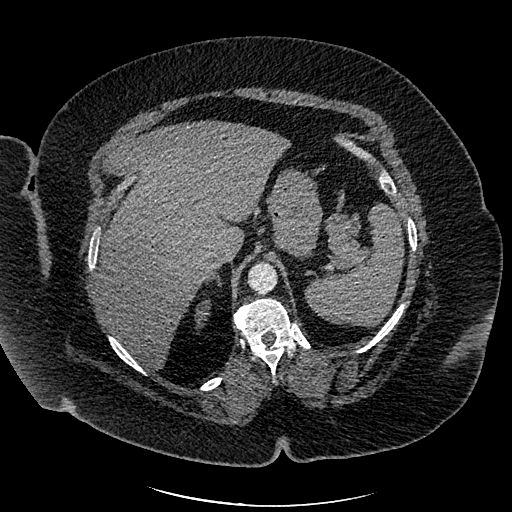
[im 31/181  lung]
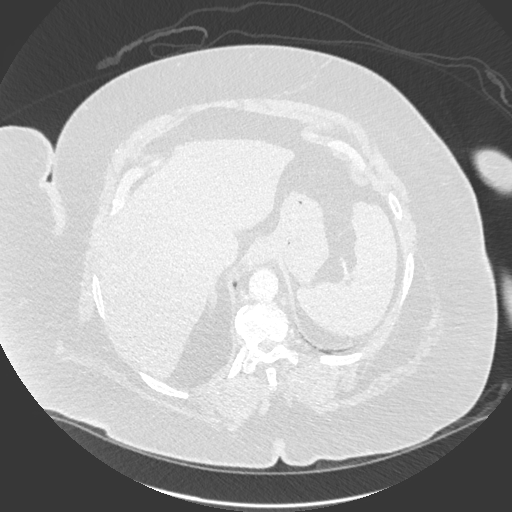
[im 41/181  mediastinal]
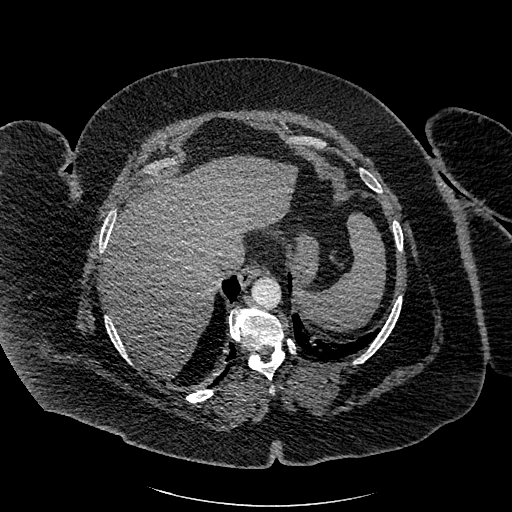
[im 51/181  lung]
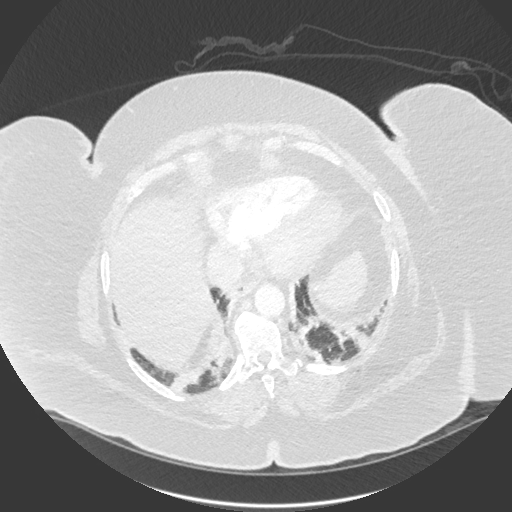
[im 61/181  mediastinal]
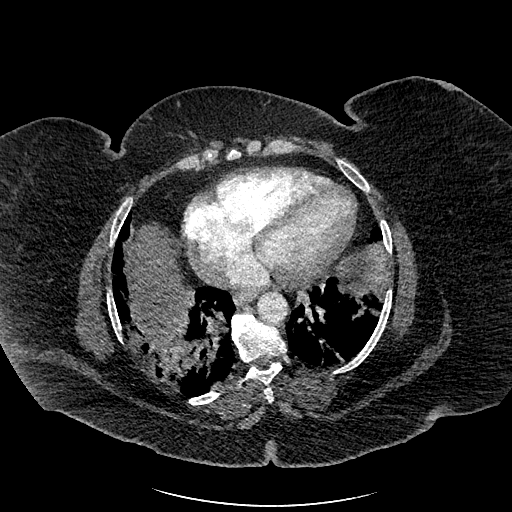
[im 71/181  lung]
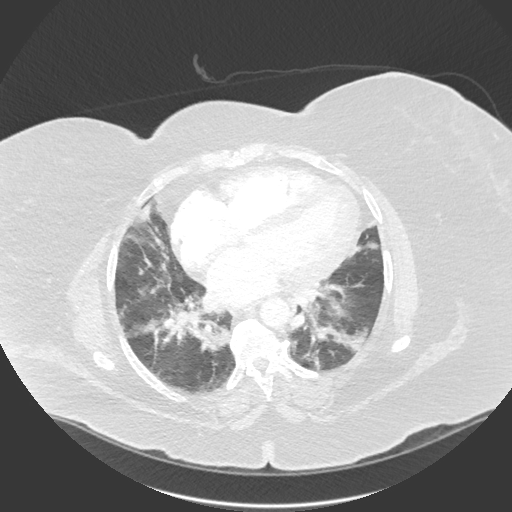
[im 81/181  mediastinal]
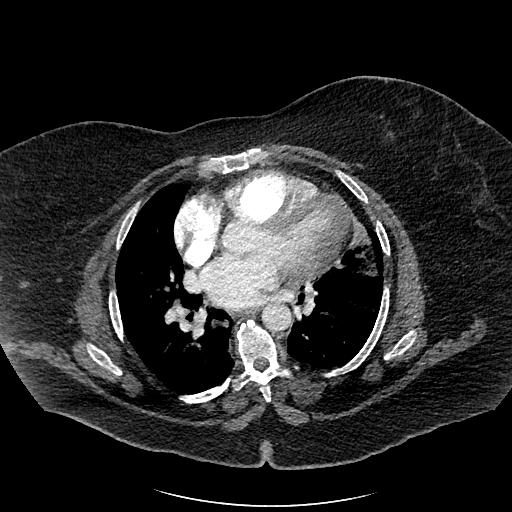
[im 91/181  lung]
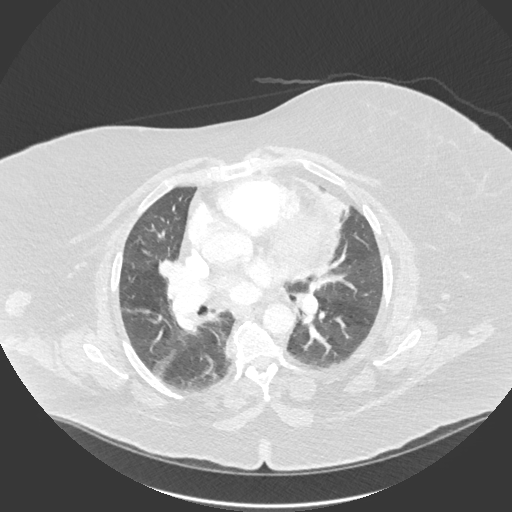
[im 101/181  mediastinal]
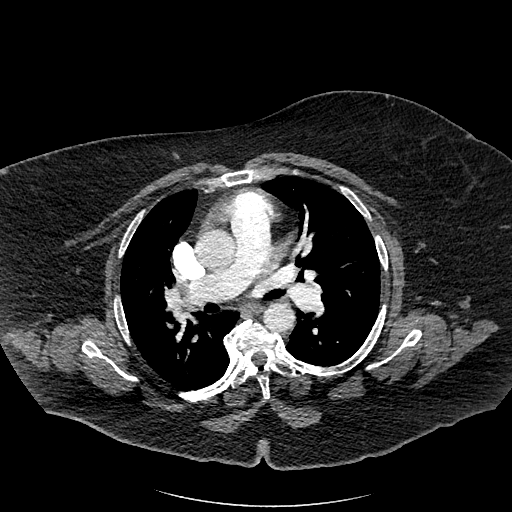
[im 111/181  lung]
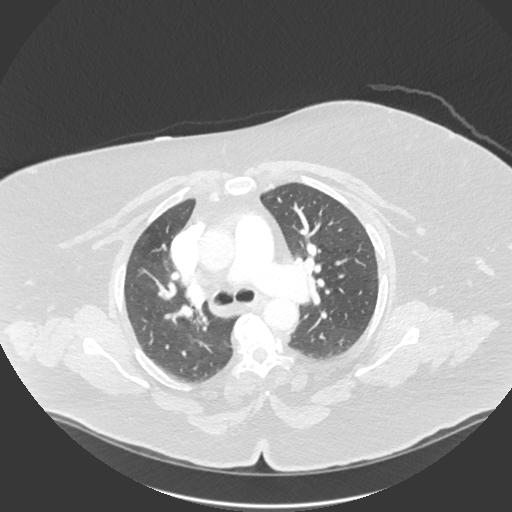
[im 121/181  mediastinal]
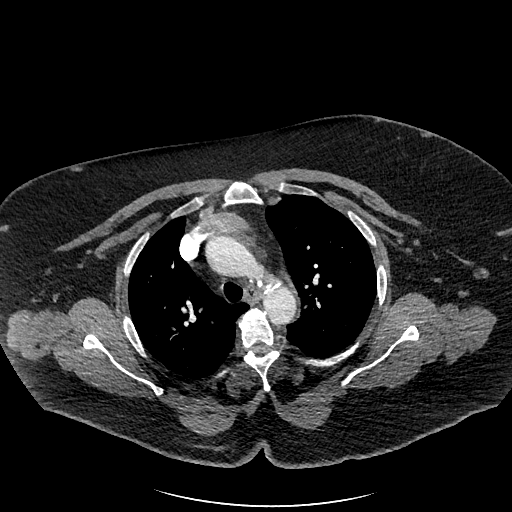
[im 131/181  lung]
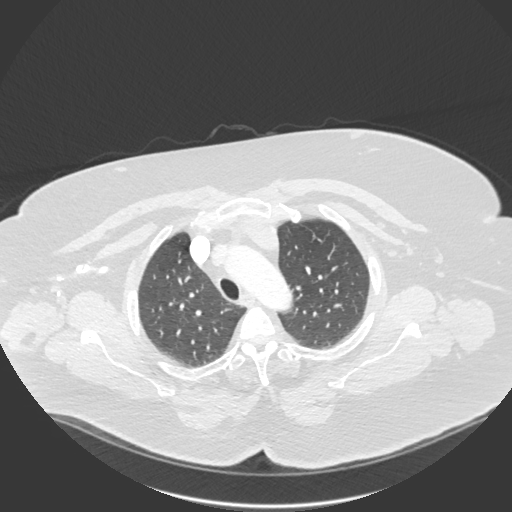
[im 141/181  mediastinal]
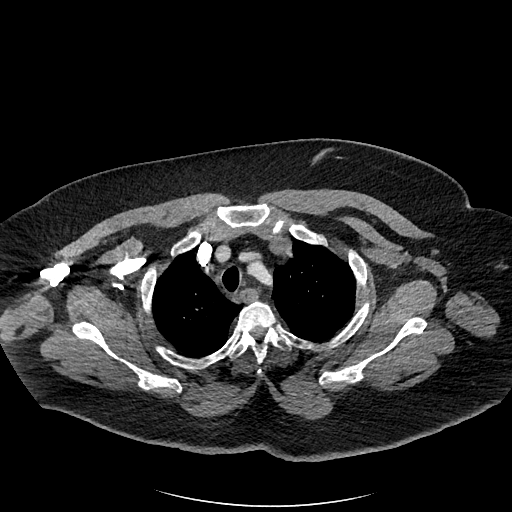
[im 151/181  lung]
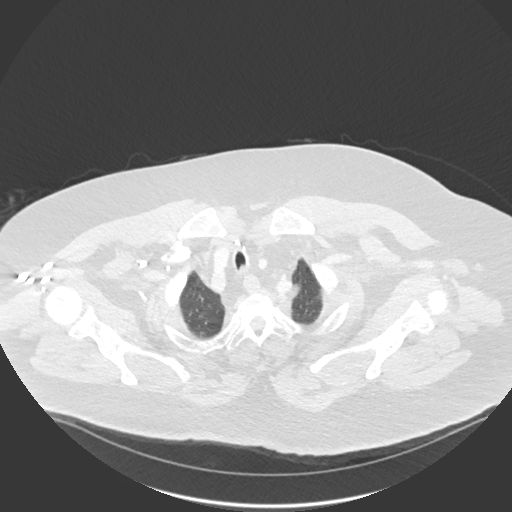
[im 161/181  mediastinal]
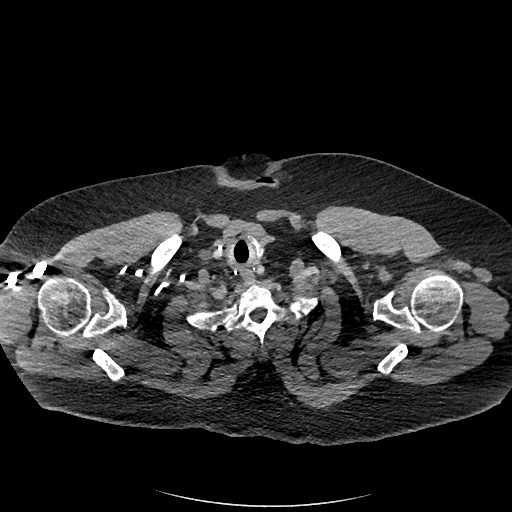
[im 171/181  lung]
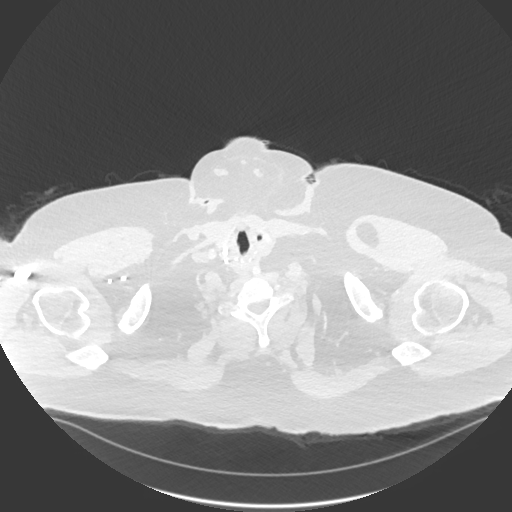

[Series 8: pe 2mm cor · coronal · 0.59mm/px · 1 of 112 slices shown]
[im 56/112  mediastinal]
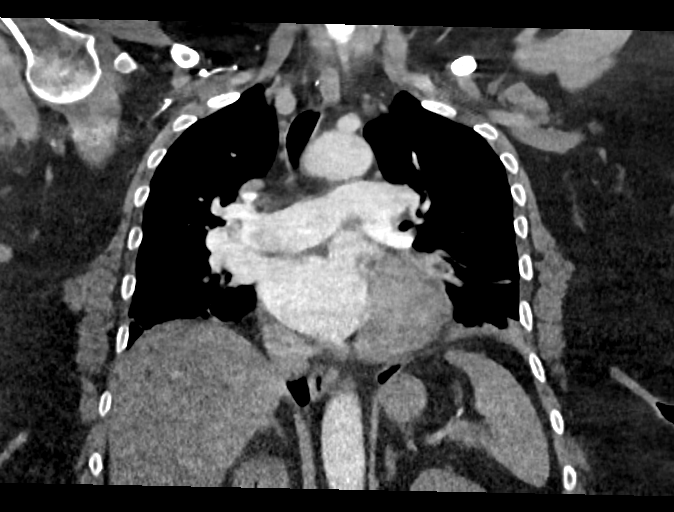

[18 of 36 positions shown; findings below may reference images not displayed]

FINDINGS: Cardiovascular: Majority of the peripheral segmental and
subsegmental pulmonary branches to the lower lobes cannot be
definitively characterized due to patient breathing motion artifact,
however, there is no pulmonary embolism identified within the main,
lobar or central segmental pulmonary artery branches bilaterally.
Cannot exclude small nonocclusive pulmonary emboli.

No thoracic aortic aneurysm or evidence of aortic dissection. No
pericardial effusion. Mild aortic atherosclerosis.

Mediastinum/Nodes: No mass or enlarged lymph nodes are seen within
the mediastinum or perihilar regions. Esophagus is unremarkable.
Trachea appears normal.

Lungs/Pleura: Patchy bibasilar consolidations, atelectasis versus
pneumonia. Upper lungs are clear. No pleural effusion or
pneumothorax.

Upper Abdomen: Limited images of the upper abdomen are unremarkable.

Musculoskeletal: Mild degenerative spondylosis of the slightly
scoliotic thoracic spine. No acute appearing osseous abnormality.

Review of the MIP images confirms the above findings.
IMPRESSION: 1. No central obstructing pulmonary embolism is identified. However,
due to patient breathing motion artifact, small nonocclusive
pulmonary emboli or peripheral pulmonary emboli cannot be
confidently excluded. If symptoms persist or worsen, consider repeat
chest CT angiogram if patient can perform adequate breath hold.
2. Patchy bibasilar consolidations, atelectasis versus pneumonia.

Aortic Atherosclerosis (39CI5-AHJ.J).

## 2023-01-01 ENCOUNTER — Encounter (HOSPITAL_COMMUNITY): Payer: Medicaid Other | Admitting: Cardiology

## 2023-01-03 ENCOUNTER — Ambulatory Visit: Payer: Self-pay | Admitting: Nurse Practitioner

## 2023-01-12 ENCOUNTER — Ambulatory Visit: Payer: Medicaid Other | Admitting: Nurse Practitioner

## 2023-01-19 ENCOUNTER — Emergency Department (HOSPITAL_COMMUNITY)
Admission: EM | Admit: 2023-01-19 | Discharge: 2023-01-20 | Disposition: A | Discharge status: Home / Self Care | Payer: Medicaid Other | Attending: Emergency Medicine | Admitting: Emergency Medicine

## 2023-01-19 ENCOUNTER — Emergency Department (HOSPITAL_COMMUNITY): Payer: Medicaid Other

## 2023-01-19 ENCOUNTER — Encounter (HOSPITAL_COMMUNITY): Payer: Self-pay | Admitting: Emergency Medicine

## 2023-01-19 ENCOUNTER — Other Ambulatory Visit: Payer: Self-pay

## 2023-01-19 DIAGNOSIS — S7002XA Contusion of left hip, initial encounter: Secondary | ICD-10-CM | POA: Insufficient documentation

## 2023-01-19 DIAGNOSIS — S8002XA Contusion of left knee, initial encounter: Secondary | ICD-10-CM | POA: Insufficient documentation

## 2023-01-19 DIAGNOSIS — Z1152 Encounter for screening for COVID-19: Secondary | ICD-10-CM | POA: Diagnosis not present

## 2023-01-19 DIAGNOSIS — Z79899 Other long term (current) drug therapy: Secondary | ICD-10-CM | POA: Insufficient documentation

## 2023-01-19 DIAGNOSIS — Z7982 Long term (current) use of aspirin: Secondary | ICD-10-CM | POA: Insufficient documentation

## 2023-01-19 DIAGNOSIS — W19XXXA Unspecified fall, initial encounter: Secondary | ICD-10-CM | POA: Diagnosis not present

## 2023-01-19 DIAGNOSIS — S8992XA Unspecified injury of left lower leg, initial encounter: Secondary | ICD-10-CM | POA: Diagnosis present

## 2023-01-19 LAB — CBG MONITORING, ED: Glucose-Capillary: 114 mg/dL — ABNORMAL HIGH (ref 70–99)

## 2023-01-19 LAB — CBC WITH DIFFERENTIAL/PLATELET
Abs Immature Granulocytes: 0.1 10*3/uL — ABNORMAL HIGH (ref 0.00–0.07)
Basophils Absolute: 0.1 10*3/uL (ref 0.0–0.1)
Basophils Relative: 0 %
Eosinophils Absolute: 0.2 10*3/uL (ref 0.0–0.5)
Eosinophils Relative: 1 %
HCT: 37.4 % (ref 36.0–46.0)
Hemoglobin: 11.9 g/dL — ABNORMAL LOW (ref 12.0–15.0)
Immature Granulocytes: 1 %
Lymphocytes Relative: 16 %
Lymphs Abs: 2.7 10*3/uL (ref 0.7–4.0)
MCH: 29.8 pg (ref 26.0–34.0)
MCHC: 31.8 g/dL (ref 30.0–36.0)
MCV: 93.5 fL (ref 80.0–100.0)
Monocytes Absolute: 0.8 10*3/uL (ref 0.1–1.0)
Monocytes Relative: 5 %
Neutro Abs: 12.7 10*3/uL — ABNORMAL HIGH (ref 1.7–7.7)
Neutrophils Relative %: 77 %
Platelets: 295 10*3/uL (ref 150–400)
RBC: 4 MIL/uL (ref 3.87–5.11)
RDW: 14.2 % (ref 11.5–15.5)
WBC: 16.5 10*3/uL — ABNORMAL HIGH (ref 4.0–10.5)
nRBC: 0 % (ref 0.0–0.2)

## 2023-01-19 LAB — COMPREHENSIVE METABOLIC PANEL
ALT: 13 U/L (ref 0–44)
AST: 15 U/L (ref 15–41)
Albumin: 3.7 g/dL (ref 3.5–5.0)
Alkaline Phosphatase: 99 U/L (ref 38–126)
Anion gap: 13 (ref 5–15)
BUN: 20 mg/dL (ref 8–23)
CO2: 31 mmol/L (ref 22–32)
Calcium: 8.5 mg/dL — ABNORMAL LOW (ref 8.9–10.3)
Chloride: 96 mmol/L — ABNORMAL LOW (ref 98–111)
Creatinine, Ser: 1.1 mg/dL — ABNORMAL HIGH (ref 0.44–1.00)
GFR, Estimated: 57 mL/min — ABNORMAL LOW (ref >60)
Glucose, Bld: 117 mg/dL — ABNORMAL HIGH (ref 70–99)
Potassium: 3.4 mmol/L — ABNORMAL LOW (ref 3.5–5.1)
Sodium: 140 mmol/L (ref 135–145)
Total Bilirubin: 0.3 mg/dL (ref 0.3–1.2)
Total Protein: 7 g/dL (ref 6.5–8.1)

## 2023-01-19 LAB — URINALYSIS, ROUTINE W REFLEX MICROSCOPIC
Bilirubin Urine: NEGATIVE
Glucose, UA: NEGATIVE mg/dL
Hgb urine dipstick: NEGATIVE
Ketones, ur: NEGATIVE mg/dL
Leukocytes,Ua: NEGATIVE
Nitrite: NEGATIVE
Protein, ur: NEGATIVE mg/dL
Specific Gravity, Urine: 1.012 (ref 1.005–1.030)
pH: 5 (ref 5.0–8.0)

## 2023-01-19 LAB — SARS CORONAVIRUS 2 BY RT PCR: SARS Coronavirus 2 by RT PCR: NEGATIVE

## 2023-01-19 NOTE — ED Triage Notes (Addendum)
Presents from home via EMS for fall last week and has been having L leg since. Woke today with L leg and L arm swollen. She was not seen for injuries at time of fall. Original fall involved an episode of syncope.  EMS 120/60, 97%on 3L (baseline 2 L), HR 60s, 146CBG  In triage, pt complains of L hand and L hip, knee, ankle pain.

## 2023-01-19 NOTE — ED Notes (Signed)
ED PA at bedside for [facility]

## 2023-01-19 NOTE — ED Notes (Signed)
While in the lobby, pt placed back on oxygen via nasal cannula set to 2 lpm as requested.

## 2023-01-19 NOTE — ED Provider Triage Note (Addendum)
Emergency Medicine Provider Triage Evaluation Note  Michele Owens , a 62 y.o. female  was evaluated in triage.  Pt complains of left hand swelling, onset this morning upon waking, has since improved. Also states last week (1 week ago today) she fell/passed out- states she was sitting down, waiting for transportation and when she stood up and felt light headed and passed out, fell to the ground unassisted. Witnessed by bystanders who offered to call 911 but patient refused. She then went into the store and was given water to drink, no injuries or complaints at that time. Now reports pain on the lateral aspect of the left hip and left knee.  Called 911 today because "I needed to get checked out." Also reports migraine headache, history of migraines, has not taken anything for her pain today, no new or different symptoms from her normal migraines.  Patient's primary concern is that her body is swelling, history of same.  Scheduled to see new PCP on 02/02/23. Has been out of all of her medications x 1 month.  Review of Systems  Positive:  Negative:   Physical Exam  BP 127/64 (BP Location: Right Arm)   Pulse 65   Temp 99.2 F (37.3 C) (Oral)   Resp 20   Wt 126 kg   SpO2 94%   BMI 46.22 kg/m  Gen:   Awake, no distress   Resp:  Normal effort  MSK:   Moves extremities without difficulty. No pain with palpation of the knee or hip, normal seated rom of the left knee and hip.  Other:    Medical Decision Making  Medically screening exam initiated at 10:04 PM.  Appropriate orders placed.  [name] was informed that the remainder of the evaluation will be completed by another provider, this initial triage assessment does not replace that evaluation, and the importance of remaining in the ED until their evaluation is complete.     Jeannie Fend, PA-C 01/19/23 2209    Jeannie Fend, PA-C 01/19/23 2210    Jeannie Fend, PA-C 01/19/23 2211

## 2023-01-20 MED ORDER — ACETAMINOPHEN 325 MG PO TABS
650.0000 mg | ORAL_TABLET | Freq: Four times a day (QID) | ORAL | Status: DC | PRN
  Administered 2023-01-20: 650 mg via ORAL
  Filled 2023-01-20: qty 2

## 2023-01-20 NOTE — ED Provider Notes (Signed)
Michele Owens EMERGENCY DEPARTMENT AT [facility] Provider Note   CSN: [id number] Arrival date & time: 01/19/23  2137     History  Chief Complaint  Patient presents with   Fall    Last Week    Michele Owens is a 62 y.o. female.  Pt complains of pain in her left knee and left hip after falling a week ago.  Pt denies any head impact.  Patient also reports that she has some swelling in her lower extremities.  Patient denies any fever or chills she denies any cough or congestion.  She is currently on 2 L of oxygen.  Patient reports that she is not having any difficulty breathing.  The history is provided by the patient. No language interpreter was used.  Fall This is a new problem. The problem has not changed since onset.Pertinent negatives include no chest pain and no abdominal pain. The symptoms are relieved by acetaminophen. She has tried acetaminophen for the symptoms.       Home Medications Prior to Admission medications   Medication Sig Start Date End Date Taking? Authorizing Provider  acetaminophen (TYLENOL) 500 MG tablet Take 1,000 mg by mouth every 6 (six) hours as needed for moderate pain or headache.    [provider]  albuterol (VENTOLIN HFA) 108 (90 Base) MCG/ACT inhaler Inhale 1-2 puffs into the lungs every 6 (six) hours as needed for wheezing or shortness of breath. 11/24/22   Laurey Morale, MD  allopurinol (ZYLOPRIM) 100 MG tablet Take 100 mg by mouth daily.    [provider]  aspirin EC 81 MG tablet Take 81 mg by mouth daily. Swallow whole.    [provider]  benzonatate (TESSALON) 200 MG capsule Take 200 mg by mouth 3 (three) times daily. 07/21/22   [provider]  blood glucose meter kit and supplies KIT Dispense based on patient and insurance preference. Use up to four times daily as directed. 12/25/20   Azucena Fallen, MD  budesonide (PULMICORT) 0.25 MG/2ML nebulizer solution Inhale 2 mLs (0.25 mg total) by  nebulization 2 (two) times daily. 11/24/22   Laurey Morale, MD  busPIRone (BUSPAR) 5 MG tablet Take 1 tablet (5 mg total) by mouth 3 (three) times daily. 11/24/22   Laurey Morale, MD  butalbital-acetaminophen-caffeine (FIORICET) [phone] MG tablet Take 1 tablet by mouth 2 (two) times daily as needed for headache or migraine. 11/24/22   Laurey Morale, MD  calcitRIOL (ROCALTROL) 0.5 MCG capsule Take 1 capsule (0.5 mcg total) by mouth daily. 09/26/16   Hongalgi, Maximino Greenland, MD  carvedilol (COREG) 3.125 MG tablet Take 1 tablet (3.125 mg total) by mouth 2 (two) times daily with a meal. 11/24/22 12/24/22  Laurey Morale, MD  clonazePAM (KLONOPIN) 2 MG tablet Take 1 tablet (2 mg total) by mouth 3 (three) times daily. 11/24/22   Laurey Morale, MD  cyclobenzaprine (FLEXERIL) 10 MG tablet Take 10 mg by mouth 3 (three) times daily as needed for muscle spasms. 11/19/19   [provider]  EPINEPHrine 0.3 mg/0.3 mL IJ SOAJ injection Inject 0.3 mg into the muscle once as needed for anaphylaxis.    [provider]  fenofibrate (TRICOR) 145 MG tablet Take 1 tablet (145 mg total) by mouth daily. 11/24/22   Laurey Morale, MD  FEROSUL 325 (65 Fe) MG tablet Take 1 tablet (325 mg total) by mouth daily. 11/24/22   Laurey Morale, MD  haloperidol (HALDOL) 5 MG  tablet Take 1 tablet (5 mg total) by mouth at bedtime. 12/16/22 01/15/23  Willeen Niece, MD  levocetirizine (XYZAL) 5 MG tablet Take 1 tablet (5 mg total) by mouth at bedtime. 11/24/22   Laurey Morale, MD  levothyroxine (SYNTHROID) 112 MCG tablet Take 2 tablets (224 mcg total) by mouth daily before breakfast. 11/24/22   Laurey Morale, MD  lidocaine (LIDODERM) 5 % Place 1 patch onto the skin daily. Remove & Discard patch within 12 hours or as directed by MD 10/12/22   Blue, Soijett A, PA-C  loperamide (IMODIUM) 2 MG capsule Take 1 capsule (2 mg total) by mouth 4 (four) times daily as needed for diarrhea or loose stools. 11/24/22   Laurey Morale, MD  loratadine (CLARITIN) 10 MG tablet Take 10 mg by mouth daily as needed for allergies.    [provider]  losartan (COZAAR) 25 MG tablet Take 1 tablet (25 mg total) by mouth daily. 11/24/22   Laurey Morale, MD  nitroGLYCERIN (NITROSTAT) 0.4 MG SL tablet Place 1 tablet (0.4 mg total) under the tongue every 5 (five) minutes as needed for chest pain. 06/19/18   Clydie Braun, MD  nystatin cream (MYCOSTATIN) Apply 1 Application topically 2 (two) times daily as needed for dry skin. 09/26/22   [provider]  omeprazole (PRILOSEC) 20 MG capsule Take 1 capsule (20 mg total) by mouth 2 (two) times daily as needed (acid reflux). 11/24/22   Laurey Morale, MD  ondansetron (ZOFRAN) 4 MG tablet Take 4 mg by mouth every 8 (eight) hours as needed for nausea or vomiting. 01/16/22   [provider]  oxyCODONE-acetaminophen (PERCOCET) 10-325 MG tablet Take 1 tablet by mouth every 6 (six) hours as needed for pain. 12/03/19   [provider]  OXYGEN Inhale 3 L into the lungs continuous.    [provider]  phentermine (ADIPEX-P) 37.5 MG tablet Take 37.5 mg by mouth daily. 05/25/21   [provider]  polyethylene glycol (MIRALAX / GLYCOLAX) 17 g packet Take 17 g by mouth daily as needed for mild constipation. 11/24/22   Laurey Morale, MD  rosuvastatin (CRESTOR) 10 MG tablet Take 1 tablet (10 mg total) by mouth every evening. 11/24/22   Laurey Morale, MD  senna-docusate (SENOKOT-S) 8.6-50 MG tablet Take 1 tablet by mouth daily. 11/24/22   Laurey Morale, MD  spironolactone (ALDACTONE) 25 MG tablet Take 1 tablet (25 mg total) by mouth daily. 11/24/22   Laurey Morale, MD  SUBOXONE 4-1 MG FILM Place 0.5-1 Film under the tongue 3 (three) times daily as needed (Withdrawal). 10/16/22   [provider]  torsemide (DEMADEX) 20 MG tablet Take 2 tablets (40 mg total) by mouth 2 (two) times daily. 11/24/22   Laurey Morale, MD  traZODone (DESYREL) 50 MG  tablet Take 2 tablets (100 mg total) by mouth at bedtime. 11/24/22   Laurey Morale, MD  umeclidinium-vilanterol [facility] ELLIPTA) 62.5-25 MCG/INH AEPB Inhale 1 puff into the lungs daily.    [provider]  valACYclovir (VALTREX) 1000 MG tablet Take 1,000 mg by mouth daily. 01/19/22   [provider]  XIFAXAN 550 MG TABS tablet Take 1 tablet (550 mg total) by mouth 3 (three) times daily. 11/24/22   Laurey Morale, MD      Allergies    Bee venom, Fioricet [butalbital-apap-caffeine], Ibuprofen, Lamisil [terbinafine], and Nsaids    Review of Systems   Review of Systems  Cardiovascular:  Negative for chest pain.  Gastrointestinal:  Negative for abdominal pain.  All other systems reviewed and are negative.   Physical Exam Updated Vital Signs BP (!) 121/57   Pulse (!) 59   Temp 98.5 F (36.9 C) (Oral)   Resp 15   Wt 126 kg   SpO2 100%   BMI 46.22 kg/m  Physical Exam Vitals and nursing note reviewed.  Constitutional:      Appearance: She is well-developed.  HENT:     Head: Normocephalic.     Mouth/Throat:     Mouth: Mucous membranes are moist.  Cardiovascular:     Rate and Rhythm: Normal rate.  Pulmonary:     Effort: Pulmonary effort is normal.     Breath sounds: Normal breath sounds.  Abdominal:     General: There is no distension.  Musculoskeletal:        General: Normal range of motion.     Cervical back: Normal range of motion.  Skin:    General: Skin is warm.  Neurological:     General: No focal deficit present.     Mental Status: She is alert and oriented to person, place, and time.  Psychiatric:        Mood and Affect: Mood normal.     ED Results / Procedures / Treatments   Labs (all labs ordered are listed, but only abnormal results are displayed) Labs Reviewed  COMPREHENSIVE METABOLIC PANEL - Abnormal; Notable for the following components:      Result Value   Potassium 3.4 (*)    Chloride 96 (*)    Glucose, Bld 117 (*)     Creatinine, Ser 1.10 (*)    Calcium 8.5 (*)    GFR, Estimated 57 (*)    All other components within normal limits  CBC WITH DIFFERENTIAL/PLATELET - Abnormal; Notable for the following components:   WBC 16.5 (*)    Hemoglobin 11.9 (*)    Neutro Abs 12.7 (*)    Abs Immature Granulocytes 0.10 (*)    All other components within normal limits  CBG MONITORING, ED - Abnormal; Notable for the following components:   Glucose-Capillary 114 (*)    All other components within normal limits  SARS CORONAVIRUS 2 BY RT PCR  URINALYSIS, ROUTINE W REFLEX MICROSCOPIC    EKG EKG Interpretation Date/Time:  Friday January 19 2023 21:43:35 EDT Ventricular Rate:  60 PR Interval:  192 QRS Duration:  74 QT Interval:  412 QTC Calculation: 412 R Axis:   52  Text Interpretation: Normal sinus rhythm Cannot rule out Anterior infarct , age undetermined Abnormal ECG When compared with ECG of 13-Dec-2022 19:49, PREVIOUS ECG IS PRESENT No significant change was found Confirmed by Glynn Octave [phone]) on 01/20/2023 7:15:51 AM  Radiology DG Knee Complete 4 Views Left  Result Date: 01/20/2023 CLINICAL DATA:  Fall 1 week ago EXAM: LEFT KNEE - COMPLETE 4+ VIEW COMPARISON:  Left knee x-ray 10/11/2022. FINDINGS: Small joint effusion is present. No acute fracture or dislocation identified. There is a well corticated density adjacent to the lateral aspect of the lateral tibial plateau which may be related to old injury. There is mild tricompartmental degenerative change. There some soft tissue swelling of the anterolateral knee. IMPRESSION: 1. No acute fracture or dislocation identified. 2. Small joint effusion. 3. Mild tricompartmental degenerative change. Electronically Signed   By: [name] M.D.   On: 01/20/2023 01:04   DG Hip Unilat With Pelvis 2-3 Views Left  Result Date: 01/20/2023 CLINICAL  DATA:  Fall 1 week ago EXAM: DG HIP (WITH OR WITHOUT PELVIS) 2-3V LEFT COMPARISON:  None Available. FINDINGS: There is  no evidence of hip fracture or dislocation. There is no evidence of arthropathy or other focal bone abnormality. IMPRESSION: Negative. Electronically Signed   By: [name] M.D.   On: 01/20/2023 00:58    Procedures Procedures    Medications Ordered in ED Medications  acetaminophen (TYLENOL) tablet 650 mg (has no administration in time range)    ED Course/ Medical Decision Making/ A&P                                 Medical Decision Making Patient fell a week ago she has soreness in her left knee and her left hip.  Patient also is concerned about swelling to her lower extremities.  Amount and/or Complexity of Data Reviewed Independent Historian: EMS    Details: EMS brought patient to the emergency department.  They reported patient is on home O2 Labs: ordered. Decision-making details documented in ED Course.    Details: Labs ordered reviewed and interpreted COVID is negative Radiology: ordered and independent interpretation performed. Decision-making details documented in ED Course.    Details: X-ray left knee left hip show no evidence of fracture  Risk OTC drugs. Risk Details: Patient requesting Tylenol for discomfort.  I discussed her results with her.  Patient states she has help at home and feels comfortable going home.  Patient will take Tylenol for discomfort.  Patient is advised to return if symptoms worsen or change she is advised to follow-up with her primary care physician for recheck.           Final Clinical Impression(s) / ED Diagnoses Final diagnoses:  Fall, initial encounter  Contusion of left knee, initial encounter  Contusion of left hip, initial encounter    Rx / DC Orders ED Discharge Orders     None      An After Visit Summary was printed and given to the patient.    Elson Areas, [place] 01/20/23 4401    Glynn Octave, MD 01/20/23 [phone]

## 2023-01-20 NOTE — Discharge Instructions (Signed)
Return if any problems.

## 2023-01-31 ENCOUNTER — Telehealth (HOSPITAL_COMMUNITY): Payer: Self-pay | Admitting: Cardiology

## 2023-01-31 NOTE — Telephone Encounter (Signed)
Pt called tearful, requesting refill of sleep medicine and nerve medication  Advised again that these meds are not managed by cardiology will need to establish care asap  Courtesy refill was done once 8/16 and will not be done again per provider  Phone number for primary care [phone] given again to patient

## 2023-02-02 ENCOUNTER — Encounter (HOSPITAL_COMMUNITY): Payer: Self-pay | Admitting: Cardiology

## 2023-02-02 ENCOUNTER — Telehealth (HOSPITAL_COMMUNITY): Payer: Self-pay

## 2023-02-02 ENCOUNTER — Other Ambulatory Visit (HOSPITAL_COMMUNITY): Payer: Self-pay

## 2023-02-02 ENCOUNTER — Ambulatory Visit (HOSPITAL_COMMUNITY)
Admission: RE | Admit: 2023-02-02 | Discharge: 2023-02-02 | Disposition: A | Discharge status: Home / Self Care | Payer: Medicaid Other | Source: Ambulatory Visit | Attending: Cardiology | Admitting: Cardiology

## 2023-02-02 VITALS — BP 120/70 | HR 70 | Wt 277.4 lb

## 2023-02-02 DIAGNOSIS — I5032 Chronic diastolic (congestive) heart failure: Secondary | ICD-10-CM | POA: Insufficient documentation

## 2023-02-02 DIAGNOSIS — Z87891 Personal history of nicotine dependence: Secondary | ICD-10-CM | POA: Diagnosis not present

## 2023-02-02 DIAGNOSIS — J4489 Other specified chronic obstructive pulmonary disease: Secondary | ICD-10-CM | POA: Diagnosis present

## 2023-02-02 DIAGNOSIS — E669 Obesity, unspecified: Secondary | ICD-10-CM | POA: Insufficient documentation

## 2023-02-02 DIAGNOSIS — Z6841 Body Mass Index (BMI) 40.0 and over, adult: Secondary | ICD-10-CM | POA: Diagnosis not present

## 2023-02-02 DIAGNOSIS — Z5982 Transportation insecurity: Secondary | ICD-10-CM | POA: Insufficient documentation

## 2023-02-02 DIAGNOSIS — Z79899 Other long term (current) drug therapy: Secondary | ICD-10-CM | POA: Insufficient documentation

## 2023-02-02 DIAGNOSIS — I110 Hypertensive heart disease with heart failure: Secondary | ICD-10-CM | POA: Diagnosis present

## 2023-02-02 DIAGNOSIS — J9611 Chronic respiratory failure with hypoxia: Secondary | ICD-10-CM | POA: Insufficient documentation

## 2023-02-02 MED ORDER — LOSARTAN POTASSIUM 25 MG PO TABS: 25.0000 mg | ORAL_TABLET | Freq: Every day | ORAL | 11 refills | Status: DC

## 2023-02-02 MED ORDER — CARVEDILOL 3.125 MG PO TABS: 3.1250 mg | ORAL_TABLET | Freq: Two times a day (BID) | ORAL | 11 refills | Status: DC

## 2023-02-02 MED ORDER — SPIRONOLACTONE 25 MG PO TABS: 25.0000 mg | ORAL_TABLET | Freq: Every day | ORAL | 11 refills | Status: DC

## 2023-02-02 MED ORDER — ROSUVASTATIN CALCIUM 10 MG PO TABS: 10.0000 mg | ORAL_TABLET | Freq: Every evening | ORAL | 11 refills | Status: DC

## 2023-02-02 MED ORDER — FENOFIBRATE 145 MG PO TABS: 145.0000 mg | ORAL_TABLET | Freq: Every day | ORAL | 11 refills | Status: DC

## 2023-02-02 MED ORDER — FARXIGA 10 MG PO TABS: 10.0000 mg | ORAL_TABLET | Freq: Every day | ORAL | 11 refills | Status: DC

## 2023-02-02 NOTE — Patient Instructions (Signed)
START Farxiga 10 mg daily.  Blood work in  10 days.  Your physician recommends that you schedule a follow-up appointment in: 3 months.  If you have any questions or concerns before your next appointment please send [place] a message through [place] or call our office at [phone].    TO LEAVE A MESSAGE FOR THE NURSE SELECT OPTION 2, PLEASE LEAVE A MESSAGE INCLUDING: YOUR NAME DATE OF BIRTH CALL BACK NUMBER REASON FOR CALL**this is important as we prioritize the call backs  YOU WILL RECEIVE A CALL BACK THE SAME DAY AS LONG AS YOU CALL BEFORE 4:00 PM  At the [facility], you and your health needs are our priority. As part of our continuing mission to provide you with exceptional heart care, we have created designated Provider Care Teams. These Care Teams include your primary Cardiologist (physician) and Advanced Practice Providers (APPs- Physician Assistants and Nurse Practitioners) who all work together to provide you with the care you need, when you need it.   You may see any of the following providers on your designated Care Team at your next follow up: Dr Arvilla Meres Dr Marca Ancona Dr. Dorthula Nettles Dr. Clearnce Hasten Amy Filbert Schilder, NP Robbie Lis, [place] [facility] [place], [place] Brynda Peon, NP [place] Lee, NP Karle Plumber, PharmD   Please be sure to bring in all your medications bottles to every appointment.    Thank you for choosing [facility]

## 2023-02-02 NOTE — Telephone Encounter (Signed)
[facility] Patient Advocate Encounter  Prior authorization for Marcelline Deist has been submitted and approved. Test billing returns refill too soon rejection; unable to confirm copay at this time.  [employer] Effective: 01/19/2023 to 02/02/2024  Burnell Blanks, CPhT Rx Patient Advocate Phone: [phone]

## 2023-02-04 NOTE — Progress Notes (Signed)
Primary Care: Pcp, No HF Cardiology: Dr Shirlee Latch  Michele Owens is a 62 y.o. with a history of  HTN, obesity, OSA, COPD, hyperlipidemia, diastolic CHF, gout, bipolar, and SVT who was referred to HF MD clinic from [facility] clinic.     Admitted in 8/16 with diastolic CHF and COPD exacerbation.  She was admitted in 1/17 with COPD exacerbation.  She was set up for a Cardiolite in 2/17.  This showed a small, moderate intensity partially reversible inferolateral defect.  LHC/RHC in 3/17 showed no significant CAD, mildly elevated PCWP.  PFTs in 3/17 showed severe COPD.     Admitted 10/15-10/19/17 with AECOPD and A/C diastolic CHF.  Diuresed with IV lasix. Completed course for CAP. Pt also noted have multinodular goiter on [place].  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    Echo in 4/18 showed EF 45-50%, moderate dilated RV with moderately decreased systolic function.    She was admitted again in 4/18 with COPD exacerbation, acute on chronic diastolic CHF, and CAP.     She was admitted in 3/20 with acute on chronic diastolic CHF and treated with IV Lasix.  Echo showed EF 60-65%, mild RV enlargement with normal function, respirophasic septal variation.      Echo in 5/21 showed LVEF 55-60%, RV normal, RVSP was normal.  RHC in 6/21 showed mean RA 11, PA 44/14 mean 27, mean PCWP 15, CI 4.6, PVR 1.1.   Echo in 2/24 showed EF 55-60%, mild LVH, mildly D-shaped septum.    Most recent admission was in 9/24 with somnolence/hypoxemia in the setting of hypercapnia likely from polypharmacy. She was put on Bipap and improved.   Patient has been doing reasonably well since last discharge.  She is doing some walking for exercise but does use a walker for balance.  No dyspnea walking short distances, more limited by knee pain. Weight is down 6 lbs.  She is not smoking or drinking ETOH.  She is wearing 2 L home oxygen at all times.  Occasional palpitations noted at night while lying in bed.  No chest  pain.    ECG (personally reviewed): NSR, nonspecific T wave flattening  Review of Systems: All systems reviewed and negative except as per HPI.   PMH: 1. Back and hip pain 2. HTN 3. Obesity 4. GERD 5. Hyperlipidemia 6. Depression 7. COPD: Has quit smoking. Now on home oxygen 2L [place].  PFTs (3/17) with severe COPD.  8. 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 Event monitor (12/14) with only NSR noted.  10. Chest pain: Atypical.  ETT-Cardiolite (12/13) with EF 60%, no ischemia/infarction. Lexiscan Cardiolite (2/17) with small, moderate-intensity partially reversible inferolateral defect. LHC (3/17): No significant CAD. 11. Syncope: Uncertain etiology.   12. OHS/OSA: Cannot tolerate CPAP. Sleep study negative for OSA in 5/17.  13. Gout 14. Migraines 15. Diabetes 16. Diastolic CHF: Echo (8/16) with EF 55-60%, grade II diastolic dysfunction.  - RHC (4/09) with mean RA 8, PA 45/20 mean 30, mean PCWP 20; PVR 1.5 WU.  - Echo (4/18): EF 45-50%, diffuse hypokinesis, grade II diastolic dysfunction, moderately dilated RV with moderately decreased systolic function. - Echo (4/20): EF 60-65%, mild RV enlargement with normal systolic function. Respirophasic septal variation noted.   - RHC (6/21): mean RA 11, PA 44/14 mean 27, mean PCWP 15, CI 4.6, PVR 1.1.  - Echo (2/24): EF 55-60%, mild LVH, mildly D-shaped septum.   17. Papillary thyroid carcinoma: Noted  on biopsy 3/18.  18. Hypothyroidism   Current Outpatient Medications  Medication Sig Dispense Refill   albuterol (VENTOLIN HFA) 108 (90 Base) MCG/ACT inhaler Inhale 1-2 puffs into the lungs every 6 (six) hours as needed for wheezing or shortness of breath. 1 each 0   aspirin EC 81 MG tablet Take 81 mg by mouth daily. Swallow whole.     blood glucose meter kit and supplies KIT Dispense based on patient and insurance preference. Use up to four times daily as directed. 1 each 0   budesonide (PULMICORT) 0.25  MG/2ML nebulizer solution Inhale 2 mLs (0.25 mg total) by nebulization 2 (two) times daily. 120 mL 0   EPINEPHrine 0.3 mg/0.3 mL IJ SOAJ injection Inject 0.3 mg into the muscle once as needed for anaphylaxis.     FARXIGA 10 MG TABS tablet Take 1 tablet (10 mg total) by mouth daily before breakfast. 30 tablet 11   FEROSUL 325 (65 Fe) MG tablet Take 1 tablet (325 mg total) by mouth daily. 30 tablet 0   loratadine (CLARITIN) 10 MG tablet Take 10 mg by mouth daily as needed for allergies.     nitroGLYCERIN (NITROSTAT) 0.4 MG SL tablet Place 1 tablet (0.4 mg total) under the tongue every 5 (five) minutes as needed for chest pain. 12 tablet 0   OXYGEN Inhale 3 L into the lungs continuous.     phentermine (ADIPEX-P) 37.5 MG tablet Take 37.5 mg by mouth daily.     SUBOXONE 4-1 MG FILM Place 0.5-1 Film under the tongue 3 (three) times daily as needed (Withdrawal).     torsemide (DEMADEX) 20 MG tablet Take 2 tablets (40 mg total) by mouth 2 (two) times daily. 120 tablet 11   umeclidinium-vilanterol (ANORO ELLIPTA) 62.5-25 MCG/INH AEPB Inhale 1 puff into the lungs daily.     acetaminophen (TYLENOL) 500 MG tablet Take 1,000 mg by mouth every 6 (six) hours as needed for moderate pain or headache. (Patient not taking: Reported on 02/02/2023)     allopurinol (ZYLOPRIM) 100 MG tablet Take 100 mg by mouth daily. (Patient not taking: Reported on 02/02/2023)     busPIRone (BUSPAR) 5 MG tablet Take 1 tablet (5 mg total) by mouth 3 (three) times daily. (Patient not taking: Reported on 02/02/2023) 90 tablet 0   butalbital-acetaminophen-caffeine (FIORICET) 50-325-40 MG tablet Take 1 tablet by mouth 2 (two) times daily as needed for headache or migraine. (Patient not taking: Reported on 02/02/2023) 60 tablet 0   calcitRIOL (ROCALTROL) 0.5 MCG capsule Take 1 capsule (0.5 mcg total) by mouth daily. (Patient not taking: Reported on 02/02/2023) 30 capsule 0   carvedilol (COREG) 3.125 MG tablet Take 1 tablet (3.125 mg total) by  mouth 2 (two) times daily with a meal. 60 tablet 11   clonazePAM (KLONOPIN) 2 MG tablet Take 1 tablet (2 mg total) by mouth 3 (three) times daily. (Patient not taking: Reported on 02/02/2023) 90 tablet 0   cyclobenzaprine (FLEXERIL) 10 MG tablet Take 10 mg by mouth 3 (three) times daily as needed for muscle spasms. (Patient not taking: Reported on 02/02/2023)     fenofibrate (TRICOR) 145 MG tablet Take 1 tablet (145 mg total) by mouth daily. 30 tablet 11   haloperidol (HALDOL) 5 MG tablet Take 1 tablet (5 mg total) by mouth at bedtime. (Patient not taking: Reported on 02/02/2023) 30 tablet 0   levocetirizine (XYZAL) 5 MG tablet Take 1 tablet (5 mg total) by mouth at bedtime. (Patient not taking: Reported on 02/02/2023)  30 tablet 0   levothyroxine (SYNTHROID) 112 MCG tablet Take 2 tablets (224 mcg total) by mouth daily before breakfast. (Patient not taking: Reported on 02/02/2023) 60 tablet 0   lidocaine (LIDODERM) 5 % Place 1 patch onto the skin daily. Remove & Discard patch within 12 hours or as directed by MD (Patient not taking: Reported on 02/02/2023) 30 patch 0   loperamide (IMODIUM) 2 MG capsule Take 1 capsule (2 mg total) by mouth 4 (four) times daily as needed for diarrhea or loose stools. (Patient not taking: Reported on 02/02/2023) 120 capsule 0   losartan (COZAAR) 25 MG tablet Take 1 tablet (25 mg total) by mouth daily. 30 tablet 11   nystatin cream (MYCOSTATIN) Apply 1 Application topically 2 (two) times daily as needed for dry skin. (Patient not taking: Reported on 02/02/2023)     omeprazole (PRILOSEC) 20 MG capsule Take 1 capsule (20 mg total) by mouth 2 (two) times daily as needed (acid reflux). (Patient not taking: Reported on 02/02/2023) 60 capsule 0   ondansetron (ZOFRAN) 4 MG tablet Take 4 mg by mouth every 8 (eight) hours as needed for nausea or vomiting. (Patient not taking: Reported on 02/02/2023)     polyethylene glycol (MIRALAX / GLYCOLAX) 17 g packet Take 17 g by mouth daily as  needed for mild constipation. (Patient not taking: Reported on 02/02/2023) 30 each 0   rosuvastatin (CRESTOR) 10 MG tablet Take 1 tablet (10 mg total) by mouth every evening. 30 tablet 11   senna-docusate (SENOKOT-S) 8.6-50 MG tablet Take 1 tablet by mouth daily. (Patient not taking: Reported on 02/02/2023) 30 tablet 0   spironolactone (ALDACTONE) 25 MG tablet Take 1 tablet (25 mg total) by mouth daily. 30 tablet 11   traZODone (DESYREL) 50 MG tablet Take 2 tablets (100 mg total) by mouth at bedtime. (Patient not taking: Reported on 02/02/2023) 60 tablet 0   valACYclovir (VALTREX) 1000 MG tablet Take 1,000 mg by mouth daily. (Patient not taking: Reported on 02/02/2023)     XIFAXAN 550 MG TABS tablet Take 1 tablet (550 mg total) by mouth 3 (three) times daily. (Patient not taking: Reported on 02/02/2023) 90 tablet 0   No current facility-administered medications for this encounter.    Allergies  Allergen Reactions   Bee Venom Swelling and Other (See Comments)    "All over my body" (swelling)   Fioricet [Butalbital-Apap-Caffeine] Nausea And Vomiting and Rash   Ibuprofen Rash and Other (See Comments)    Severe rash   Lamisil [Terbinafine] Rash and Other (See Comments)    Pt states this causes her to "feel funny"   Nsaids Other (See Comments)    Per MD's orders       Social History   Socioeconomic History   Marital status: Single    Spouse name: Not on file   Number of children: 1   Years of education: Not on file   Highest education level: Not on file  Occupational History   Occupation: disabled  Tobacco Use   Smoking status: Former    Current packs/day: 0.00    Average packs/day: 0.5 packs/day for 41.0 years (20.5 ttl pk-yrs)    Types: Cigarettes    Start date: 07/1975    Quit date: 07/2016    Years since quitting: 6.5   Smokeless tobacco: Never  Vaping Use   Vaping status: Never Used  Substance and Sexual Activity   Alcohol use: Not Currently    Comment: quit 12 years  ago  Drug use: Not Currently    Types: Marijuana, Cocaine    Comment: quit 12 years ago   Sexual activity: Yes  Other Topics Concern   Not on file  Social History Narrative   Not on file   Social Determinants of Health   Financial Resource Strain: Low Risk  (10/24/2022)   Overall Financial Resource Strain (CARDIA)    Difficulty of Paying Living Expenses: Not very hard  Food Insecurity: Food Insecurity Present (12/14/2022)   Hunger Vital Sign    Worried About Running Out of Food in the Last Year: Often true    Ran Out of Food in the Last Year: Often true  Transportation Needs: Unmet Transportation Needs (12/14/2022)   PRAPARE - Administrator, Civil Service (Medical): Yes    Lack of Transportation (Non-Medical): Yes  Physical Activity: Inactive (02/01/2021)   Exercise Vital Sign    Days of Exercise per Week: 0 days    Minutes of Exercise per Session: 0 min  Stress: Stress Concern Present (02/23/2021)   [employer] of Occupational Health - Occupational Stress Questionnaire    Feeling of Stress : To some extent  Social Connections: Unknown (08/21/2021)   [facility] from [facility]   Social Network    Social Network: Not on file  Intimate Partner Violence: Not At Risk (12/14/2022)   Humiliation, Afraid, Rape, and Kick questionnaire    Fear of Current or Ex-Partner: No    Emotionally Abused: No    Physically Abused: No    Sexually Abused: No      Family History  Problem Relation Age of Onset   Cancer Father        thought to be due to exposure to concrete   Diabetes Mother    Thyroid cancer Mother        dx in her 29s-60s   Cancer Maternal Uncle        2 uncles with cancer NOS   Brain cancer Paternal Aunt    Cancer Cousin        maternal first cousin - NOS   Cancer Cousin        maternal first cousin - NOS    Vitals:   02/02/23 1351  BP: 120/70  Pulse: 70  SpO2: 97%  Weight: 125.8 kg (277 lb 6.4 oz)   Wt Readings from Last 3  Encounters:  02/02/23 125.8 kg (277 lb 6.4 oz)  01/19/23 126 kg (277 lb 12.5 oz)  11/03/22 128.7 kg (283 lb 12.8 oz)    PHYSICAL EXAM: General: NAD Neck: JVP difficult, no thyromegaly or thyroid nodule.  Lungs: Clear to auscultation bilaterally with normal respiratory effort. CV: Nondisplaced PMI.  Heart regular S1/S2, no S3/S4, no murmur.  No peripheral edema.  No carotid bruit.  Normal pedal pulses.  Abdomen: Soft, nontender, no hepatosplenomegaly, no distention.  Skin: Intact without lesions or rashes.  Neurologic: Alert and oriented x 3.  Psych: Normal affect. Extremities: No clubbing or cyanosis.  HEENT: Normal.   ASSESSMENT & PLAN: 1. Chronic diastolic CHF: Has had multiple prior admissions with CHF and COPD exacerbations.  Echo 05/2022 with EF 55-60%, D-shaped septum, Grade IDD.  NYHA class III but stable.  Not significantly volume overloaded on exam.  - Continue torsemide 40 mg bid, BMET/BNP today.  - Add Farxiga 10 mg daily.  BMET in 10 days.   - Continue losartan 25 mg daily.  - Continue spironolactone 25 mg daily.  - I think she would  benefit from [facility] placement given multiple admissions for CHF and difficult exam.  We discussed risks and benefits and she agrees to placement. I will see if her insurance will cover.  2. HTN: BP controlled today.  3. Obesity: Body mass index is 46.16 kg/m. - Would refer in future for GLP-1 agonist.  4. Chronic Hypoxemic Respiratory Failure:  Suspect due to COPD and OHS. She is on 2L home oxygen.  - Should follow with pulmonary.   Marca Ancona 02/04/2023

## 2023-02-07 ENCOUNTER — Ambulatory Visit: Payer: Medicaid Other | Admitting: Family Medicine

## 2023-02-07 ENCOUNTER — Encounter: Payer: Self-pay | Admitting: Family Medicine

## 2023-02-07 VITALS — BP 90/58 | HR 71 | Temp 98.6°F | Ht 65.0 in | Wt 269.8 lb

## 2023-02-07 DIAGNOSIS — F419 Anxiety disorder, unspecified: Secondary | ICD-10-CM

## 2023-02-07 DIAGNOSIS — I272 Pulmonary hypertension, unspecified: Secondary | ICD-10-CM

## 2023-02-07 DIAGNOSIS — G4700 Insomnia, unspecified: Secondary | ICD-10-CM

## 2023-02-07 DIAGNOSIS — Z9189 Other specified personal risk factors, not elsewhere classified: Secondary | ICD-10-CM

## 2023-02-07 DIAGNOSIS — Z9981 Dependence on supplemental oxygen: Secondary | ICD-10-CM

## 2023-02-07 DIAGNOSIS — I10 Essential (primary) hypertension: Secondary | ICD-10-CM | POA: Diagnosis not present

## 2023-02-07 DIAGNOSIS — J9611 Chronic respiratory failure with hypoxia: Secondary | ICD-10-CM

## 2023-02-07 DIAGNOSIS — E785 Hyperlipidemia, unspecified: Secondary | ICD-10-CM

## 2023-02-07 DIAGNOSIS — F319 Bipolar disorder, unspecified: Secondary | ICD-10-CM

## 2023-02-07 DIAGNOSIS — J449 Chronic obstructive pulmonary disease, unspecified: Secondary | ICD-10-CM

## 2023-02-07 DIAGNOSIS — E119 Type 2 diabetes mellitus without complications: Secondary | ICD-10-CM

## 2023-02-07 DIAGNOSIS — E890 Postprocedural hypothyroidism: Secondary | ICD-10-CM

## 2023-02-07 DIAGNOSIS — E1169 Type 2 diabetes mellitus with other specified complication: Secondary | ICD-10-CM | POA: Diagnosis not present

## 2023-02-07 DIAGNOSIS — I5032 Chronic diastolic (congestive) heart failure: Secondary | ICD-10-CM

## 2023-02-07 DIAGNOSIS — E039 Hypothyroidism, unspecified: Secondary | ICD-10-CM

## 2023-02-07 DIAGNOSIS — J9612 Chronic respiratory failure with hypercapnia: Secondary | ICD-10-CM

## 2023-02-07 LAB — CBC WITH DIFFERENTIAL/PLATELET
Basophils Absolute: 0.1 10*3/uL (ref 0.0–0.1)
Basophils Relative: 0.6 % (ref 0.0–3.0)
Eosinophils Absolute: 0.1 10*3/uL (ref 0.0–0.7)
Eosinophils Relative: 0.8 % (ref 0.0–5.0)
HCT: 42.7 % (ref 36.0–46.0)
Hemoglobin: 13.4 g/dL (ref 12.0–15.0)
Lymphocytes Relative: 12.2 % (ref 12.0–46.0)
Lymphs Abs: 2.1 10*3/uL (ref 0.7–4.0)
MCHC: 31.3 g/dL (ref 30.0–36.0)
MCV: 91.7 fL (ref 78.0–100.0)
Monocytes Absolute: 1 10*3/uL (ref 0.1–1.0)
Monocytes Relative: 5.5 % (ref 3.0–12.0)
Neutro Abs: 14.3 10*3/uL — ABNORMAL HIGH (ref 1.4–7.7)
Neutrophils Relative %: 80.9 % — ABNORMAL HIGH (ref 43.0–77.0)
Platelets: 397 10*3/uL (ref 150.0–400.0)
RBC: 4.66 Mil/uL (ref 3.87–5.11)
RDW: 14.7 % (ref 11.5–15.5)
WBC: 17.6 10*3/uL — ABNORMAL HIGH (ref 4.0–10.5)

## 2023-02-07 LAB — COMPREHENSIVE METABOLIC PANEL
ALT: 13 U/L (ref 0–35)
AST: 15 U/L (ref 0–37)
Albumin: 4.6 g/dL (ref 3.5–5.2)
Alkaline Phosphatase: 151 U/L — ABNORMAL HIGH (ref 39–117)
BUN: 20 mg/dL (ref 6–23)
CO2: 33 meq/L — ABNORMAL HIGH (ref 19–32)
Calcium: 9.3 mg/dL (ref 8.4–10.5)
Chloride: 95 meq/L — ABNORMAL LOW (ref 96–112)
Creatinine, Ser: 1.13 mg/dL (ref 0.40–1.20)
GFR: 52.02 mL/min — ABNORMAL LOW (ref >60.00)
Glucose, Bld: 117 mg/dL — ABNORMAL HIGH (ref 70–99)
Potassium: 3.2 meq/L — ABNORMAL LOW (ref 3.5–5.1)
Sodium: 138 meq/L (ref 135–145)
Total Bilirubin: 0.4 mg/dL (ref 0.2–1.2)
Total Protein: 8.2 g/dL (ref 6.0–8.3)

## 2023-02-07 LAB — TSH: TSH: 78.89 u[IU]/mL — ABNORMAL HIGH (ref 0.35–5.50)

## 2023-02-07 MED ORDER — SENNOSIDES-DOCUSATE SODIUM 8.6-50 MG PO TABS: 1.0000 | ORAL_TABLET | Freq: Every day | ORAL | 5 refills | Status: DC

## 2023-02-07 MED ORDER — BUSPIRONE HCL 5 MG PO TABS: 5.0000 mg | ORAL_TABLET | Freq: Three times a day (TID) | ORAL | 0 refills | Status: DC

## 2023-02-07 MED ORDER — TRAZODONE HCL 50 MG PO TABS: 100.0000 mg | ORAL_TABLET | Freq: Every day | ORAL | 0 refills | Status: DC

## 2023-02-07 NOTE — Patient Instructions (Signed)
Please go downstairs for labs before you leave.   I referred you to a psychiatrist and they will call you to schedule a visit.   I referred you to [facility] and they will call you to schedule.   I refilled your trazodone for sleep and Buspar for anxiety.   Follow up in 2 weeks.

## 2023-02-08 ENCOUNTER — Other Ambulatory Visit (HOSPITAL_COMMUNITY): Payer: Self-pay | Admitting: Family Medicine

## 2023-02-08 LAB — IRON,TIBC AND FERRITIN PANEL
%SAT: 12 % — ABNORMAL LOW (ref 16–45)
Ferritin: 178 ng/mL (ref 16–288)
Iron: 53 ug/dL (ref 45–160)
TIBC: 433 ug/dL (ref 250–450)

## 2023-02-08 LAB — HEMOGLOBIN A1C: Hgb A1c MFr Bld: 6.9 % — ABNORMAL HIGH (ref 4.6–6.5)

## 2023-02-09 ENCOUNTER — Other Ambulatory Visit (HOSPITAL_COMMUNITY): Payer: Self-pay | Admitting: Cardiology

## 2023-02-09 ENCOUNTER — Other Ambulatory Visit: Payer: Self-pay | Admitting: Family Medicine

## 2023-02-09 DIAGNOSIS — E876 Hypokalemia: Secondary | ICD-10-CM

## 2023-02-09 DIAGNOSIS — E890 Postprocedural hypothyroidism: Secondary | ICD-10-CM

## 2023-02-09 DIAGNOSIS — J449 Chronic obstructive pulmonary disease, unspecified: Secondary | ICD-10-CM

## 2023-02-09 MED ORDER — UMECLIDINIUM-VILANTEROL 62.5-25 MCG/ACT IN AEPB: 1.0000 | INHALATION_SPRAY | Freq: Every day | RESPIRATORY_TRACT | 0 refills | Status: DC

## 2023-02-09 MED ORDER — FEROSUL 325 (65 FE) MG PO TABS: 325.0000 mg | ORAL_TABLET | Freq: Every day | ORAL | 1 refills | Status: DC

## 2023-02-09 MED ORDER — LEVOTHYROXINE SODIUM 88 MCG PO TABS: 88.0000 ug | ORAL_TABLET | Freq: Every day | ORAL | 1 refills | Status: DC

## 2023-02-09 MED ORDER — POTASSIUM CHLORIDE CRYS ER 10 MEQ PO TBCR: 10.0000 meq | EXTENDED_RELEASE_TABLET | Freq: Two times a day (BID) | ORAL | 1 refills | Status: DC

## 2023-02-09 NOTE — Progress Notes (Signed)
Her thyroid function indicates that she needs to get back on her medication. I will send in refills but she will need to have her TSH checked again in 4-6 weeks.  Her potassium is low. I will send in potassium supplements for her to take.  Her blood sugars are higher at 6.9% but her diabetes is still controlled. Watch sugar and carbohydrate intake. One of her liver tests is elevated and this should be rechecked in 4-6 weeks also.

## 2023-02-12 ENCOUNTER — Other Ambulatory Visit (HOSPITAL_COMMUNITY): Payer: Medicaid Other

## 2023-02-12 ENCOUNTER — Encounter: Payer: Self-pay | Admitting: Family Medicine

## 2023-02-15 ENCOUNTER — Other Ambulatory Visit (HOSPITAL_COMMUNITY): Payer: Medicaid Other

## 2023-02-22 ENCOUNTER — Telehealth (HOSPITAL_COMMUNITY): Payer: Self-pay | Admitting: Licensed Clinical Social Worker

## 2023-02-22 ENCOUNTER — Other Ambulatory Visit (HOSPITAL_COMMUNITY): Payer: Medicaid Other

## 2023-02-22 NOTE — Telephone Encounter (Signed)
CSW consulted to reach out to patient regarding potential barriers to attending appts as she had multiple cancellations.  CSW attempted to call patient and patient contacts.  Patient phones not working. Patient contact had no alternative numbers for me to try- will let me know if she hears from the patient.  Burna Sis, LCSW Clinical Social Worker [facility] Desk#: [phone] Cell#: [phone]

## 2023-02-22 NOTE — Telephone Encounter (Signed)
Pt has 3 missed lab appts

## 2023-02-26 IMAGING — DX DG CHEST 2V
2 series · 2 of 2 positions shown · non-contrast
Comparison: Single-view of the chest 04/18/2020 04/10/2020.

CLINICAL DATA: Left chest pain for 2-3 months.

EXAM:
CHEST - 2 VIEW

[chest lat]
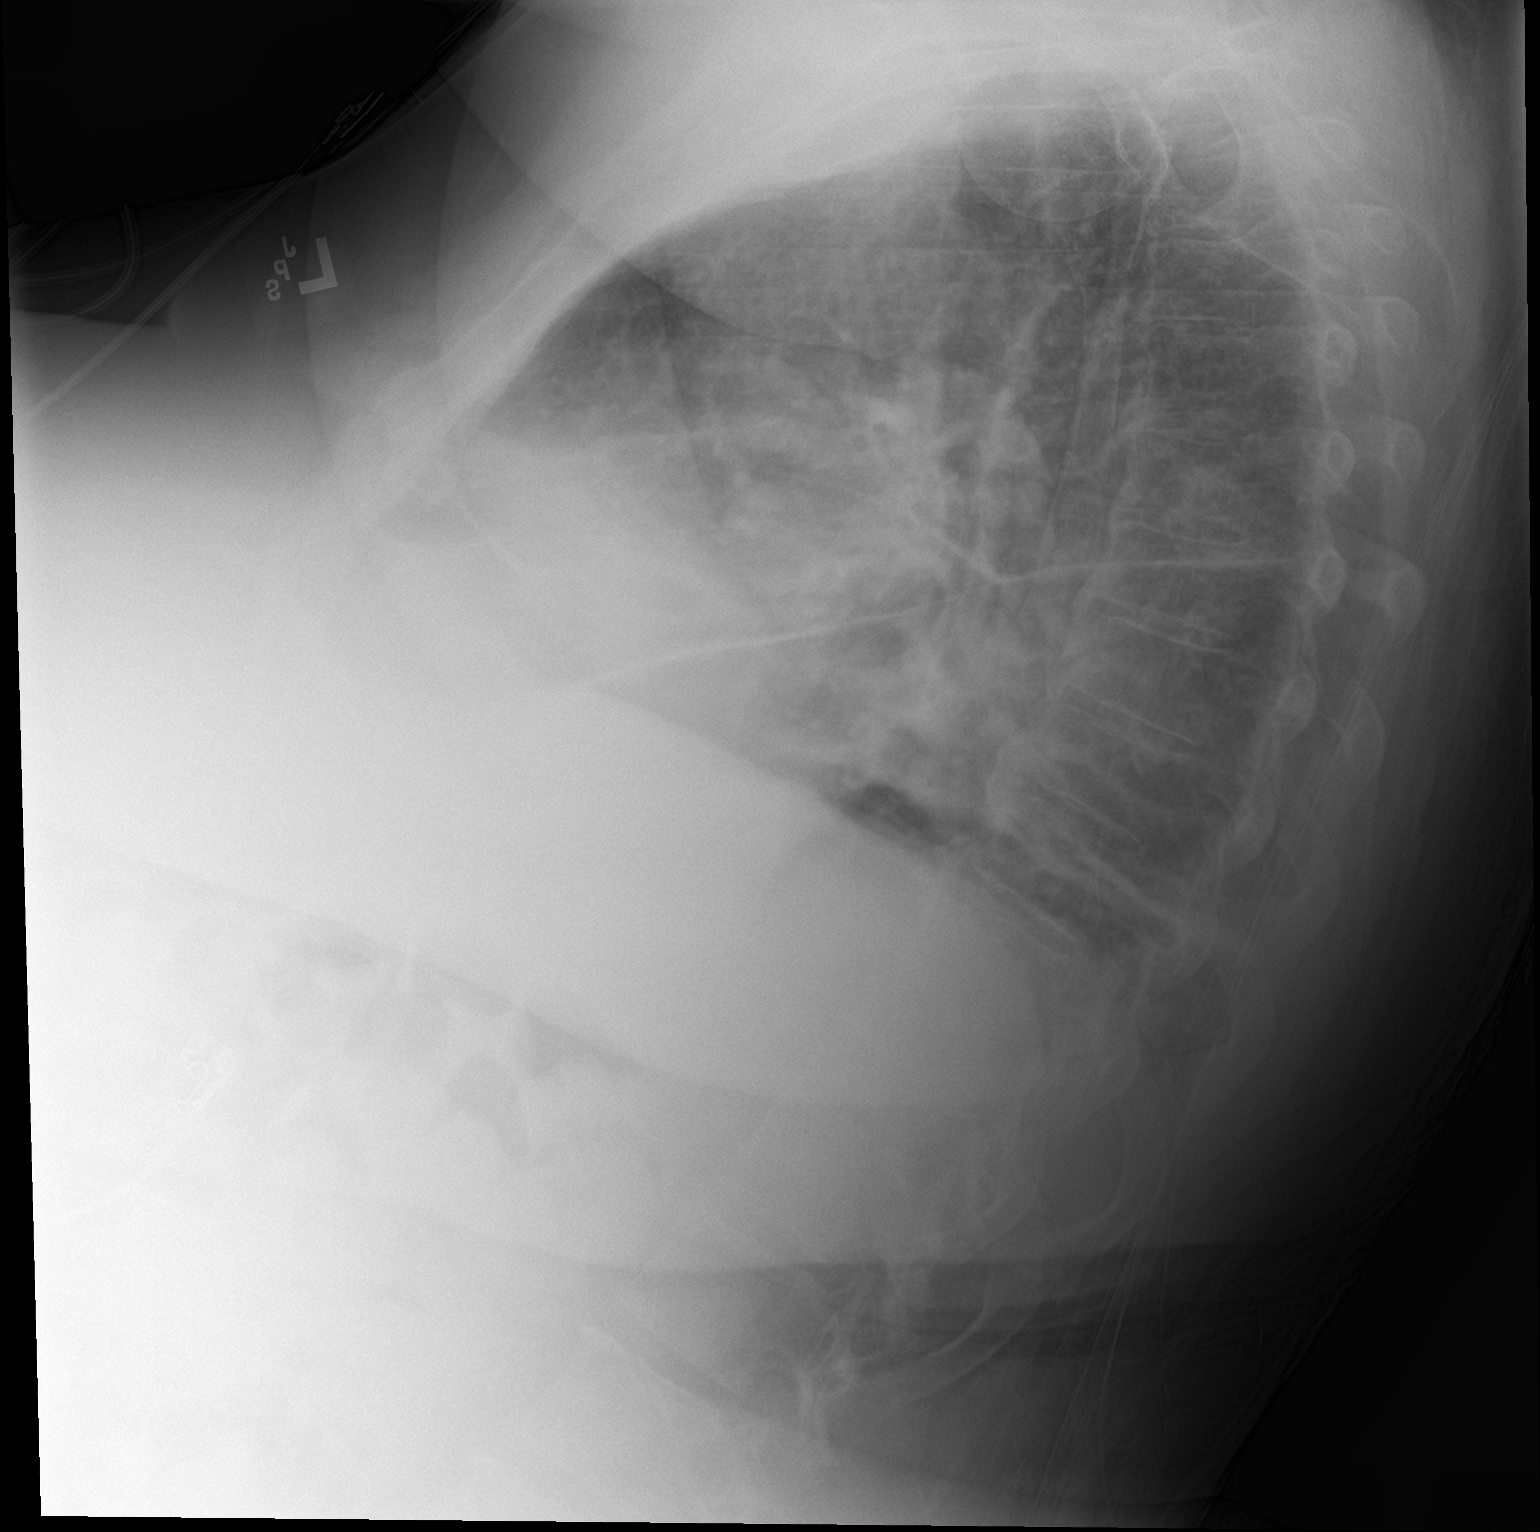

[chest ap]
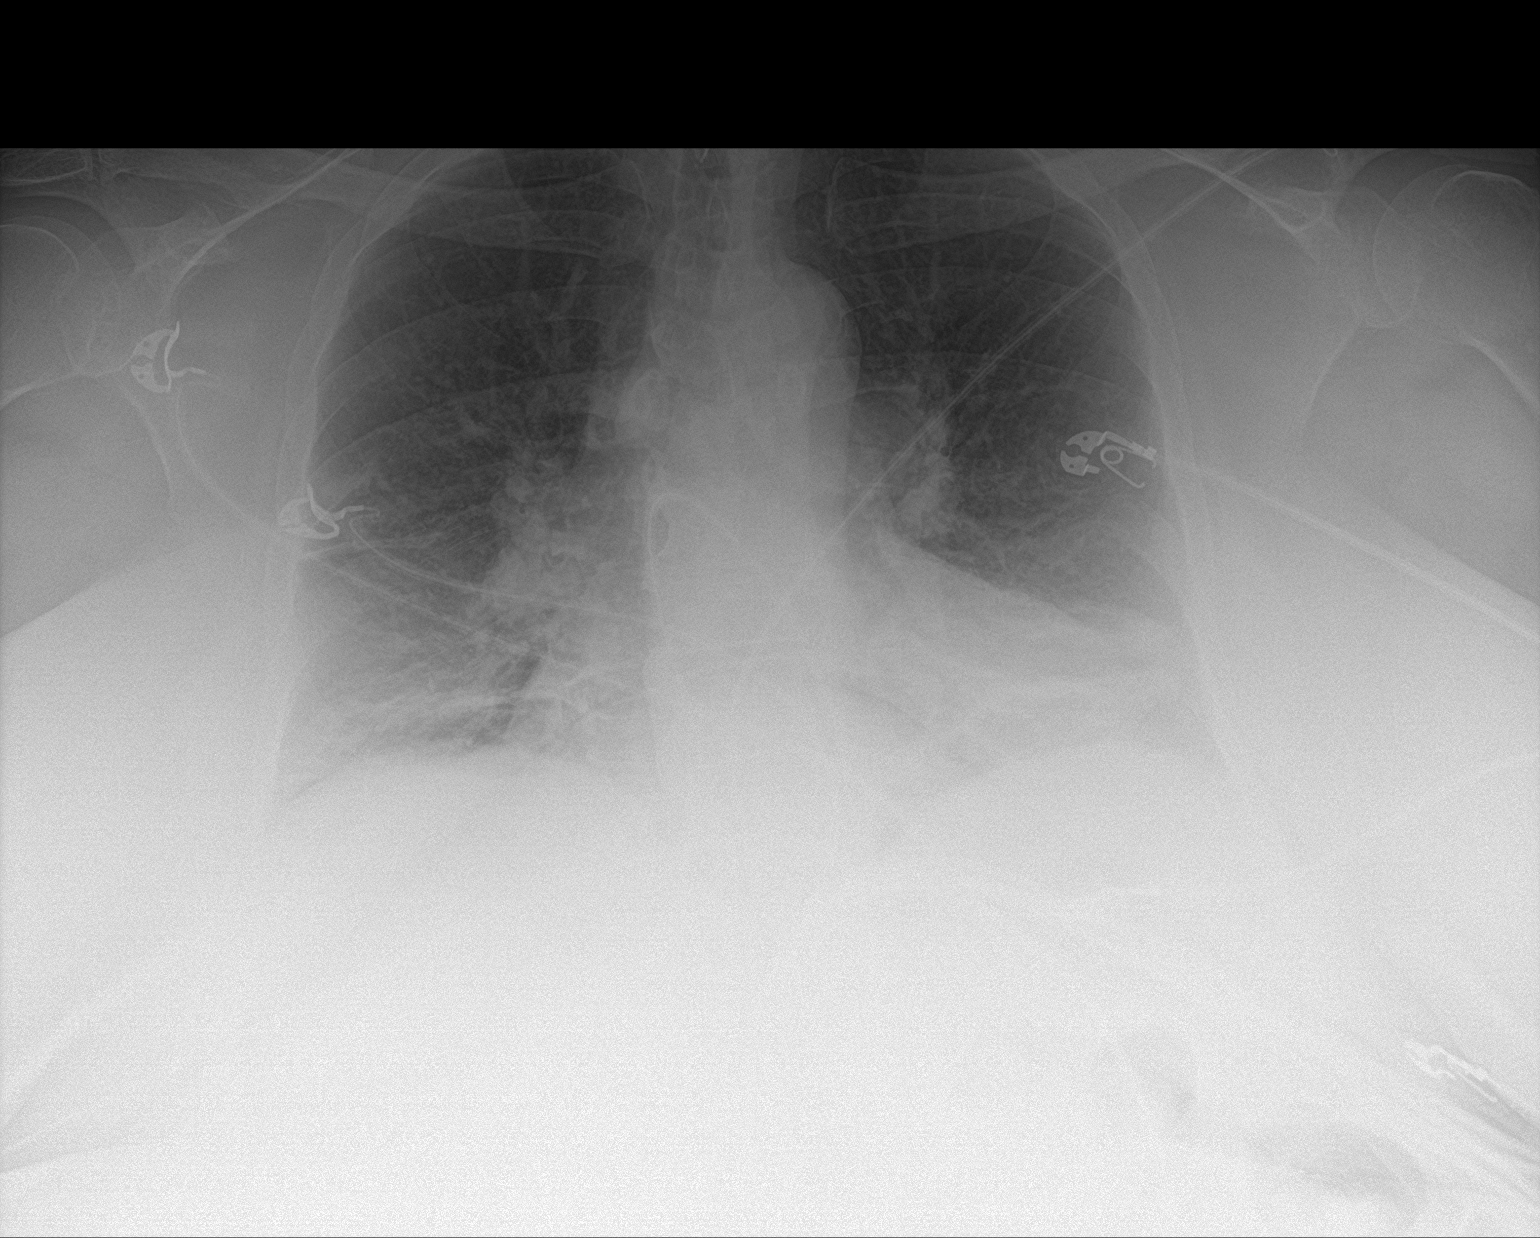

[2 of 2 positions shown; findings below may reference images not displayed]

FINDINGS: Mild subsegmental atelectasis is seen in the lung bases. Lung
volumes are low. No pneumothorax or pleural effusion. There is
cardiomegaly. Aortic atherosclerosis noted. No pneumothorax or
pleural fluid is identified. The exam is limited due to portable
technique and the patient's habitus.
IMPRESSION: Subsegmental bibasilar atelectasis in a low volume chest.

Cardiomegaly.

Aortic Atherosclerosis ([id number]-RBQ.Q).

## 2023-02-26 IMAGING — CT CT ABD-PELV W/ CM
2 of 5 series · 17 of 46 positions shown, 19 images · IV contrast (Omni 300)
Comparison: 04/10/2020

CLINICAL DATA: Generalized abdominal pain for 1 week

EXAM:
CT ABDOMEN AND PELVIS WITH CONTRAST
TECHNIQUE: Multidetector CT imaging of the abdomen and pelvis was performed
using the standard protocol following bolus administration of
intravenous contrast.
CONTRAST:  100mL OMNIPAQUE IOHEXOL 300 MG/ML  SOLN

[Series 3: a/p w/ 5mm · axial · 0.98mm/px · z∈[+868,+1288]mm · 14 of 96 slices shown, 16 images]
[im 6/96  soft-tissue]
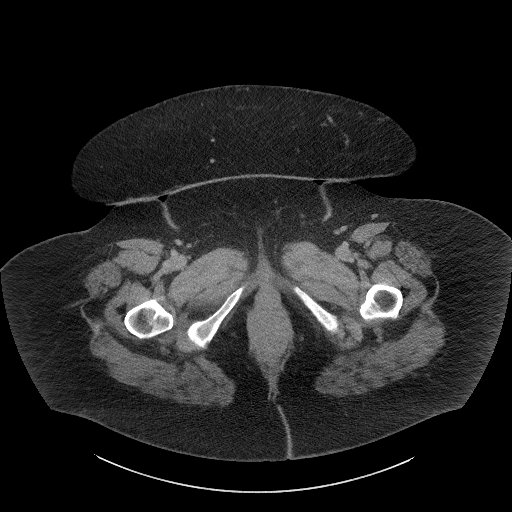
[im 6/96  bone]
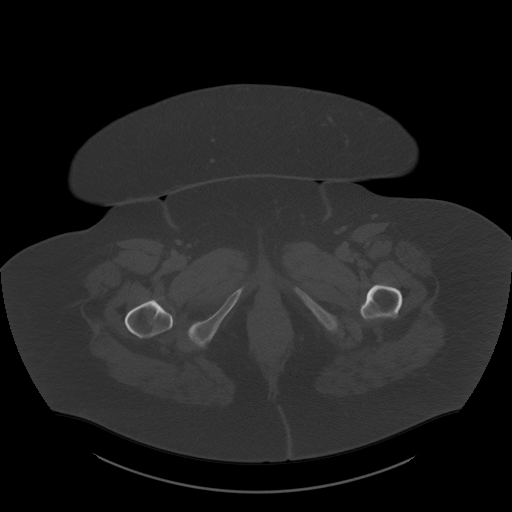
[im 11/96  soft-tissue]
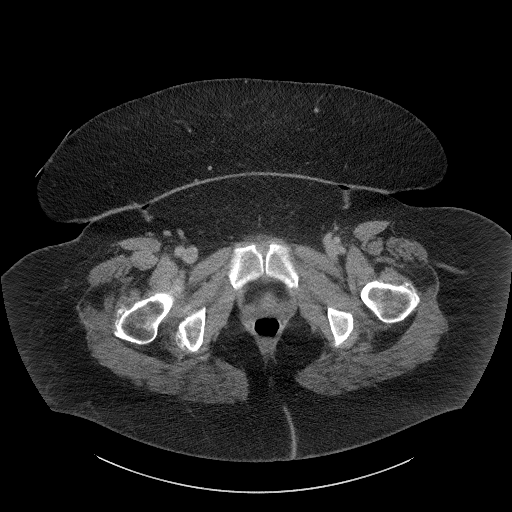
[im 22/96  soft-tissue]
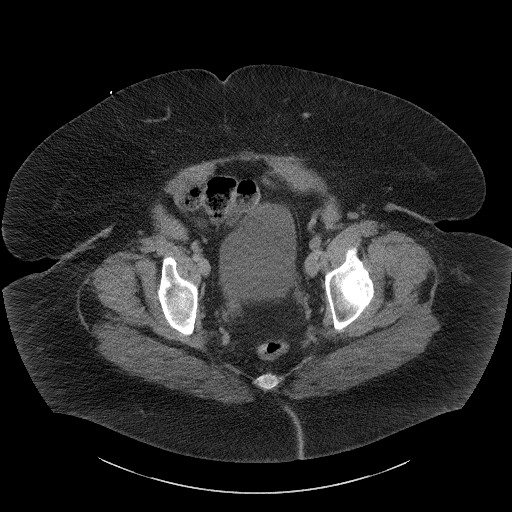
[im 27/96  soft-tissue]
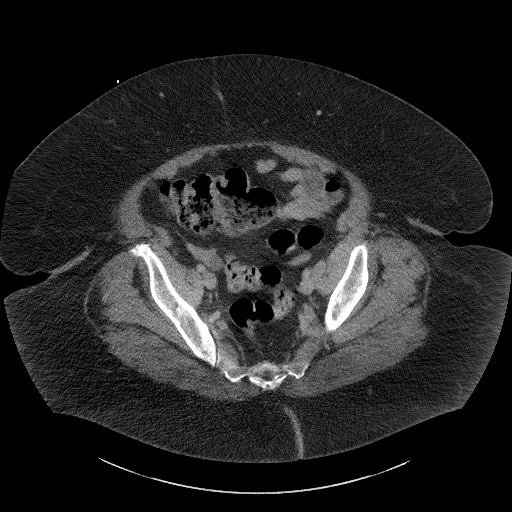
[im 32/96  soft-tissue]
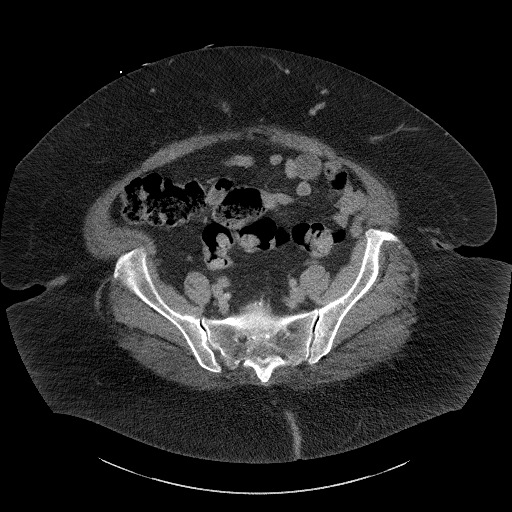
[im 37/96  soft-tissue]
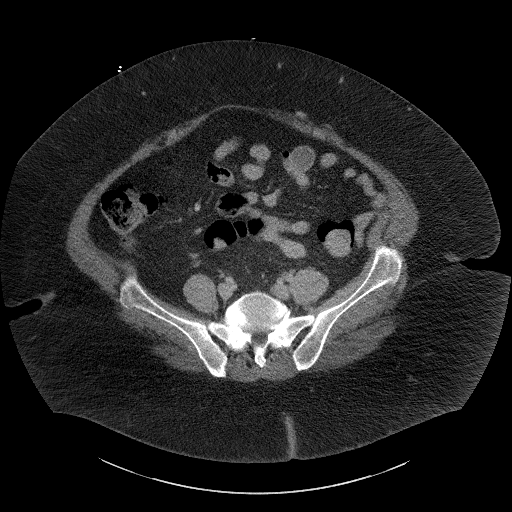
[im 43/96  soft-tissue]
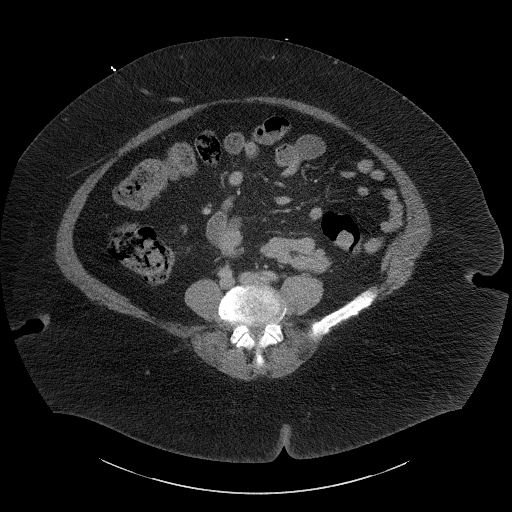
[im 53/96  soft-tissue]
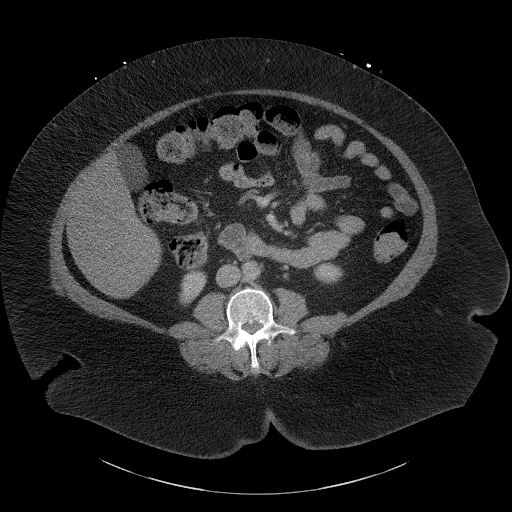
[im 59/96  soft-tissue]
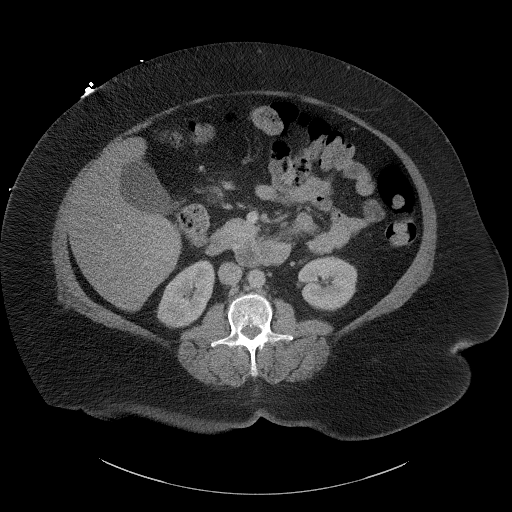
[im 59/96  bone]
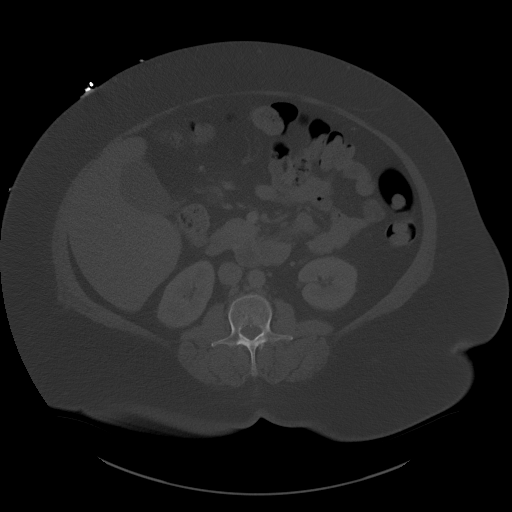
[im 64/96  soft-tissue]
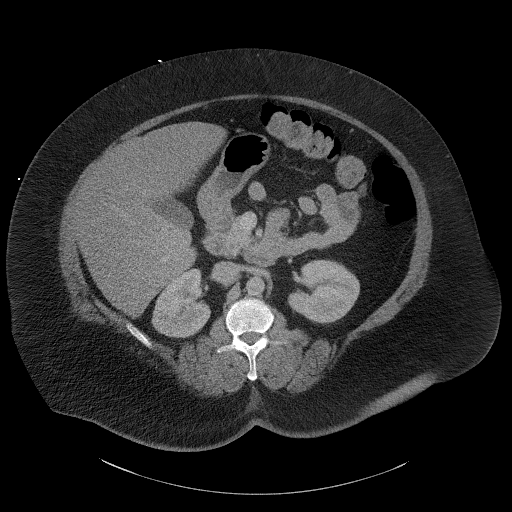
[im 69/96  soft-tissue]
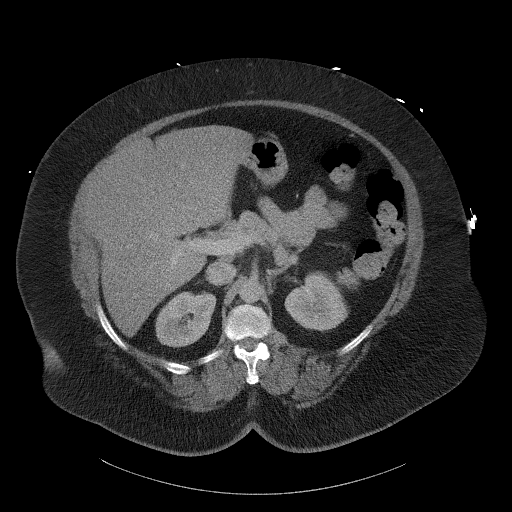
[im 74/96  soft-tissue]
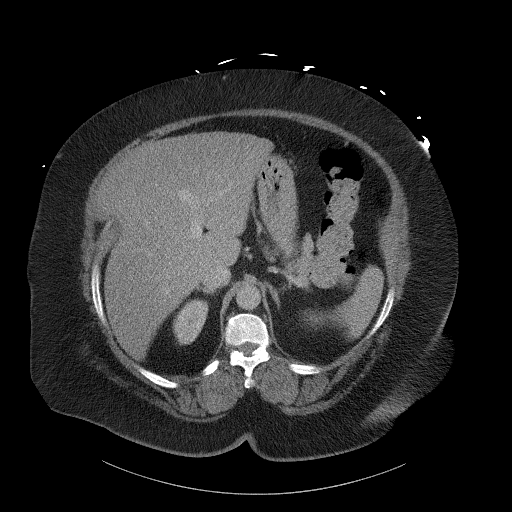
[im 85/96  soft-tissue]
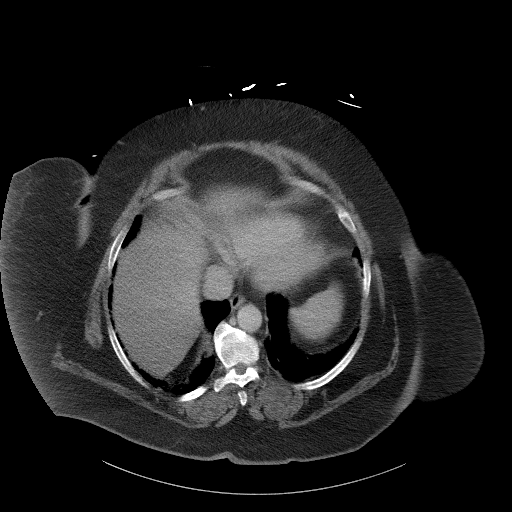
[im 90/96  soft-tissue]
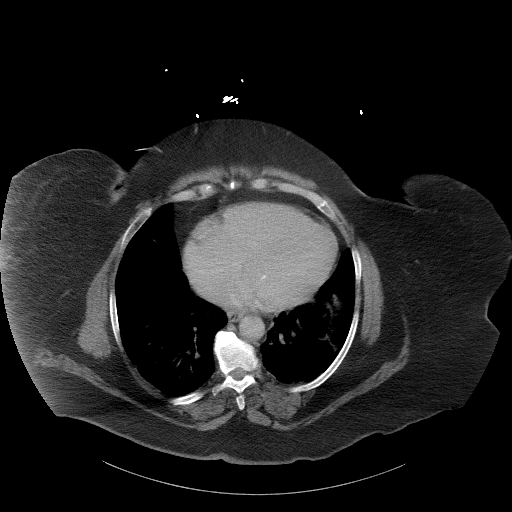

[Series 6: a/p w/ cor · coronal · 0.98mm/px · 3 of 167 slices shown]
[im 56/167  soft-tissue]
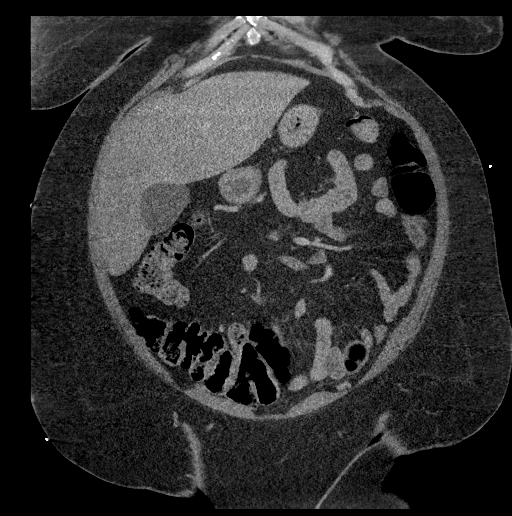
[im 74/167  soft-tissue]
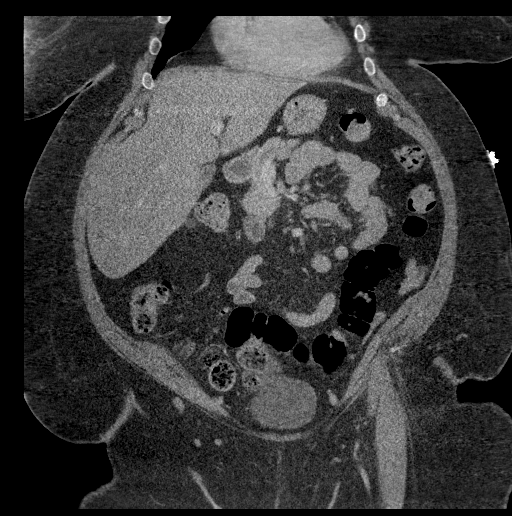
[im 93/167  soft-tissue]
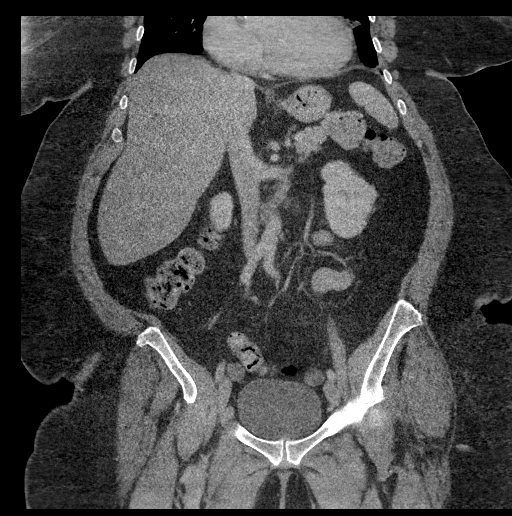

[17 of 46 positions shown; findings below may reference images not displayed]

FINDINGS: Lower chest: Bibasilar atelectasis.  Heart size is mildly enlarged.

Hepatobiliary: Decreased attenuation of the hepatic parenchyma. No
focal hepatic lesion is identified. Unremarkable gallbladder. No
hyperdense gallstone. No biliary dilatation.

Pancreas: Unremarkable. No pancreatic ductal dilatation or
surrounding inflammatory changes.

Spleen: Normal in size without focal abnormality.

Adrenals/Urinary Tract: Unremarkable adrenal glands. Kidneys enhance
symmetrically without focal lesion, stone, or hydronephrosis.
Ureters are nondilated. Urinary bladder appears unremarkable.

Stomach/Bowel: Stomach is within normal limits. Appendix appears
normal (series 3, image 59). No evidence of bowel wall thickening,
distention, or inflammatory changes.

Vascular/Lymphatic: Minimal scattered aortoiliac atherosclerotic
calcifications without aneurysm. No abdominopelvic lymphadenopathy.

Reproductive: Status post hysterectomy. No adnexal masses.

Other: No free fluid. No abdominopelvic fluid collection. No
pneumoperitoneum. No abdominal wall hernia.

Musculoskeletal: No acute or significant osseous findings.
IMPRESSION: 1. No acute abdominopelvic findings.
2. Hepatic steatosis.
3. Aortic atherosclerosis ([id number]-423.3).

## 2023-02-27 ENCOUNTER — Ambulatory Visit (HOSPITAL_COMMUNITY)
Admission: RE | Admit: 2023-02-27 | Discharge: 2023-02-27 | Disposition: A | Discharge status: Home / Self Care | Payer: Medicaid Other | Source: Ambulatory Visit | Attending: Cardiology | Admitting: Cardiology

## 2023-02-27 DIAGNOSIS — I5032 Chronic diastolic (congestive) heart failure: Secondary | ICD-10-CM | POA: Insufficient documentation

## 2023-02-27 LAB — BASIC METABOLIC PANEL
Anion gap: 13 (ref 5–15)
BUN: 17 mg/dL (ref 8–23)
CO2: 28 mmol/L (ref 22–32)
Calcium: 8.9 mg/dL (ref 8.9–10.3)
Chloride: 97 mmol/L — ABNORMAL LOW (ref 98–111)
Creatinine, Ser: 1.01 mg/dL — ABNORMAL HIGH (ref 0.44–1.00)
GFR, Estimated: >60 mL/min (ref >60)
Glucose, Bld: 160 mg/dL — ABNORMAL HIGH (ref 70–99)
Potassium: 3.5 mmol/L (ref 3.5–5.1)
Sodium: 138 mmol/L (ref 135–145)

## 2023-03-07 ENCOUNTER — Encounter (HOSPITAL_COMMUNITY): Payer: Self-pay

## 2023-03-07 ENCOUNTER — Other Ambulatory Visit: Payer: Self-pay

## 2023-03-07 ENCOUNTER — Emergency Department (HOSPITAL_COMMUNITY)
Admission: EM | Admit: 2023-03-07 | Discharge: 2023-03-08 | Disposition: A | Discharge status: Home / Self Care | Payer: Medicaid Other | Attending: Emergency Medicine | Admitting: Emergency Medicine

## 2023-03-07 DIAGNOSIS — E119 Type 2 diabetes mellitus without complications: Secondary | ICD-10-CM | POA: Insufficient documentation

## 2023-03-07 DIAGNOSIS — I5032 Chronic diastolic (congestive) heart failure: Secondary | ICD-10-CM | POA: Insufficient documentation

## 2023-03-07 DIAGNOSIS — Z85850 Personal history of malignant neoplasm of thyroid: Secondary | ICD-10-CM | POA: Insufficient documentation

## 2023-03-07 DIAGNOSIS — J45909 Unspecified asthma, uncomplicated: Secondary | ICD-10-CM | POA: Diagnosis not present

## 2023-03-07 DIAGNOSIS — M25562 Pain in left knee: Secondary | ICD-10-CM | POA: Insufficient documentation

## 2023-03-07 DIAGNOSIS — J449 Chronic obstructive pulmonary disease, unspecified: Secondary | ICD-10-CM | POA: Insufficient documentation

## 2023-03-07 DIAGNOSIS — I110 Hypertensive heart disease with heart failure: Secondary | ICD-10-CM | POA: Diagnosis not present

## 2023-03-07 NOTE — ED Triage Notes (Signed)
Pt BIB [facility] for chronic left leg pain and left hand numbness. Pt states that this has been going on for years but it has worsened recently causing her not to be able to sleep. Pt is on 2 l O2 at baseline for COPD

## 2023-03-08 ENCOUNTER — Emergency Department (HOSPITAL_COMMUNITY): Payer: Medicaid Other

## 2023-03-08 MED ORDER — ACETAMINOPHEN 500 MG PO TABS
1000.0000 mg | ORAL_TABLET | Freq: Once | ORAL | Status: AC
  Administered 2023-03-08: 1000 mg via ORAL
  Filled 2023-03-08: qty 2

## 2023-03-08 NOTE — Discharge Instructions (Signed)
You were evaluated in the Emergency Department and after careful evaluation, we did not find any emergent condition requiring admission or further testing in the hospital.  Your exam/testing today was overall reassuring.  Continue your Tylenol at home for pain and follow-up with an orthopedic specialist.  Please return to the Emergency Department if you experience any worsening of your condition.  Thank you for allowing [place] to be a part of your care.

## 2023-03-08 NOTE — ED Provider Notes (Signed)
[facility] Emergency Department Provider Note MRN:  [id number]  Arrival date & time: 03/08/23     Chief Complaint   Leg Pain   History of Present Illness   Michele Owens is a 62 y.o. year-old female with a history of CHF, COPD, obesity presenting to the ED with chief complaint of leg pain.  Chronic pain to the left knee worse tonight.  Not sure why it is hurting more.  Numbness described as a pins-and-needles to the left thigh and the left hand present for the past year or 2, unchanged.  No other complaints.  Wants an x-ray and pain medicine.  Review of Systems  A thorough review of systems was obtained and all systems are negative except as noted in the HPI and PMH.   Patient's Health History    Past Medical History:  Diagnosis Date   Anxiety    Arthritis    Asthma    Chronic diastolic CHF (congestive heart failure) (HCC)    COPD (chronic obstructive pulmonary disease) (HCC)    Uses Oxygen at night   Depression    Diabetes mellitus without complication (HCC)    GERD (gastroesophageal reflux disease)    Gout    Headache    migraines   History of DVT of lower extremity    Hypertension    Morbid obesity with BMI of 45.0-49.9, adult (HCC)    Thyroid cancer (HCC) 09/23/2016    Past Surgical History:  Procedure Laterality Date   BUNIONECTOMY Bilateral    CARDIAC CATHETERIZATION N/A 06/23/2015   Procedure: Right/Left Heart Cath and Coronary Angiography;  Surgeon: Laurey Morale, MD;  Location: [facility] INVASIVE CV LAB;  Service: Cardiovascular;  Laterality: N/A;   COLONOSCOPY WITH PROPOFOL N/A 12/15/2015   Procedure: COLONOSCOPY WITH PROPOFOL;  Surgeon: Dorena Cookey, MD;  Location: [facility] ENDOSCOPY;  Service: Endoscopy;  Laterality: N/A;   RIGHT HEART CATH N/A 10/01/2019   Procedure: RIGHT HEART CATH;  Surgeon: Laurey Morale, MD;  Location: [facility] INVASIVE CV LAB;  Service: Cardiovascular;  Laterality: N/A;   THYROIDECTOMY  09/19/2016   THYROIDECTOMY N/A  09/19/2016   Procedure: TOTAL THYROIDECTOMY;  Surgeon: Darnell Level, MD;  Location: [facility] OR;  Service: General;  Laterality: N/A;   TONSILLECTOMY     TOTAL ABDOMINAL HYSTERECTOMY  07/14/10    Family History  Problem Relation Age of Onset   Cancer Father        thought to be due to exposure to concrete   Diabetes Mother    Thyroid cancer Mother        dx in her 51s-60s   Cancer Maternal Uncle        2 uncles with cancer NOS   Brain cancer Paternal Aunt    Cancer Cousin        maternal first cousin - NOS   Cancer Cousin        maternal first cousin - NOS    Social History   Socioeconomic History   Marital status: Single    Spouse name: Not on file   Number of children: 1   Years of education: Not on file   Highest education level: Not on file  Occupational History   Occupation: disabled  Tobacco Use   Smoking status: Former    Current packs/day: 0.00    Average packs/day: 0.5 packs/day for 41.0 years (20.5 ttl pk-yrs)    Types: Cigarettes    Start date: 07/1975    Quit date: 07/2016  Years since quitting: 6.6   Smokeless tobacco: Never  Vaping Use   Vaping status: Never Used  Substance and Sexual Activity   Alcohol use: Not Currently    Comment: quit 12 years ago   Drug use: Not Currently    Types: Marijuana, Cocaine    Comment: quit 12 years ago   Sexual activity: Yes  Other Topics Concern   Not on file  Social History Narrative   Not on file   Social Determinants of Health   Financial Resource Strain: Low Risk  (10/24/2022)   Overall Financial Resource Strain (CARDIA)    Difficulty of Paying Living Expenses: Not very hard  Food Insecurity: Food Insecurity Present (12/14/2022)   Hunger Vital Sign    Worried About Running Out of Food in the Last Year: Often true    Ran Out of Food in the Last Year: Often true  Transportation Needs: Unmet Transportation Needs (12/14/2022)   PRAPARE - Administrator, Civil Service (Medical): Yes    Lack of  Transportation (Non-Medical): Yes  Physical Activity: Inactive (02/01/2021)   Exercise Vital Sign    Days of Exercise per Week: 0 days    Minutes of Exercise per Session: 0 min  Stress: Stress Concern Present (02/23/2021)   [employer] of Occupational Health - Occupational Stress Questionnaire    Feeling of Stress : To some extent  Social Connections: Unknown (08/21/2021)   Received from [facility]   Social Network    Social Network: Not on file  Intimate Partner Violence: Not At Risk (12/14/2022)   Humiliation, Afraid, Rape, and Kick questionnaire    Fear of Current or Ex-Partner: No    Emotionally Abused: No    Physically Abused: No    Sexually Abused: No     Physical Exam   Vitals:   03/07/23 2330 03/08/23 0015  BP: (!) 138/59 (!) 106/52  Pulse: 75 78  Resp: 20   Temp: 98.9 F (37.2 C)   SpO2: 96% 98%    CONSTITUTIONAL: Chronically ill-appearing, NAD NEURO/PSYCH:  Alert and oriented x 3, no focal deficits EYES:  eyes equal and reactive ENT/NECK:  no LAD, no JVD CARDIO: Regular rate, well-perfused, normal S1 and S2 PULM:  CTAB no wheezing or rhonchi GI/GU:  non-distended, non-tender MSK/SPINE:  No gross deformities, no edema SKIN:  no rash, atraumatic   *Additional and/or pertinent findings included in MDM below  Diagnostic and Interventional Summary    EKG Interpretation Date/Time:    Ventricular Rate:    PR Interval:    QRS Duration:    QT Interval:    QTC Calculation:   R Axis:      Text Interpretation:         Labs Reviewed - No data to display  DG Knee Complete 4 Views Left  Final Result      Medications  acetaminophen (TYLENOL) tablet 1,000 mg (1,000 mg Oral Given 03/08/23 0014)     Procedures  /  Critical Care Procedures  ED Course and Medical Decision Making  Initial Impression and Ddx Acute on chronic knee pain.  The legs appear symmetric without evidence of infection or DVT.  She has preserved range of motion,  and so highly doubt septic joint.  Suspect OA.  Past medical/surgical history that increases complexity of ED encounter: COPD, CHF  Interpretation of Diagnostics I personally reviewed the knee x-ray and my interpretation is as follows: No acute fracture    Patient Reassessment and  Ultimate Disposition/Management     Discharge  Patient management required discussion with the following services or consulting groups:  None  Complexity of Problems Addressed Acute complicated illness or Injury  Additional Data Reviewed and Analyzed Further history obtained from: Prior labs/imaging results  Additional Factors Impacting ED Encounter Risk None  Elmer Sow. Pilar Plate, MD [facility] Emergency Medicine [facility] [email or web address]  Final Clinical Impressions(s) / ED Diagnoses     ICD-10-CM   1. Left knee pain, unspecified chronicity  M25.562       ED Discharge Orders     None        Discharge Instructions Discussed with and Provided to Patient:     Discharge Instructions      You were evaluated in the Emergency Department and after careful evaluation, we did not find any emergent condition requiring admission or further testing in the hospital.  Your exam/testing today was overall reassuring.  Continue your Tylenol at home for pain and follow-up with an orthopedic specialist.  Please return to the Emergency Department if you experience any worsening of your condition.  Thank you for allowing [place] to be a part of your care.        Sabas Sous, MD 03/08/23 [phone]

## 2023-03-13 ENCOUNTER — Other Ambulatory Visit: Payer: Self-pay | Admitting: Family Medicine

## 2023-03-13 DIAGNOSIS — E876 Hypokalemia: Secondary | ICD-10-CM

## 2023-03-15 ENCOUNTER — Ambulatory Visit: Payer: Medicaid Other | Admitting: Family Medicine

## 2023-04-09 ENCOUNTER — Other Ambulatory Visit: Payer: Self-pay | Admitting: Family Medicine

## 2023-04-09 DIAGNOSIS — J449 Chronic obstructive pulmonary disease, unspecified: Secondary | ICD-10-CM

## 2023-04-12 ENCOUNTER — Other Ambulatory Visit (HOSPITAL_COMMUNITY): Payer: Self-pay | Admitting: Cardiology

## 2023-04-12 DIAGNOSIS — E876 Hypokalemia: Secondary | ICD-10-CM

## 2023-04-12 MED ORDER — POTASSIUM CHLORIDE CRYS ER 10 MEQ PO TBCR: 10.0000 meq | EXTENDED_RELEASE_TABLET | Freq: Two times a day (BID) | ORAL | 1 refills | Status: DC

## 2023-04-19 IMAGING — CR DG CHEST 2V
2 series · 2 of 2 positions shown · non-contrast
Comparison: June 22, 2020

CLINICAL DATA: Bilateral knee pain.

EXAM:
CHEST - 2 VIEW

[x chest ap]
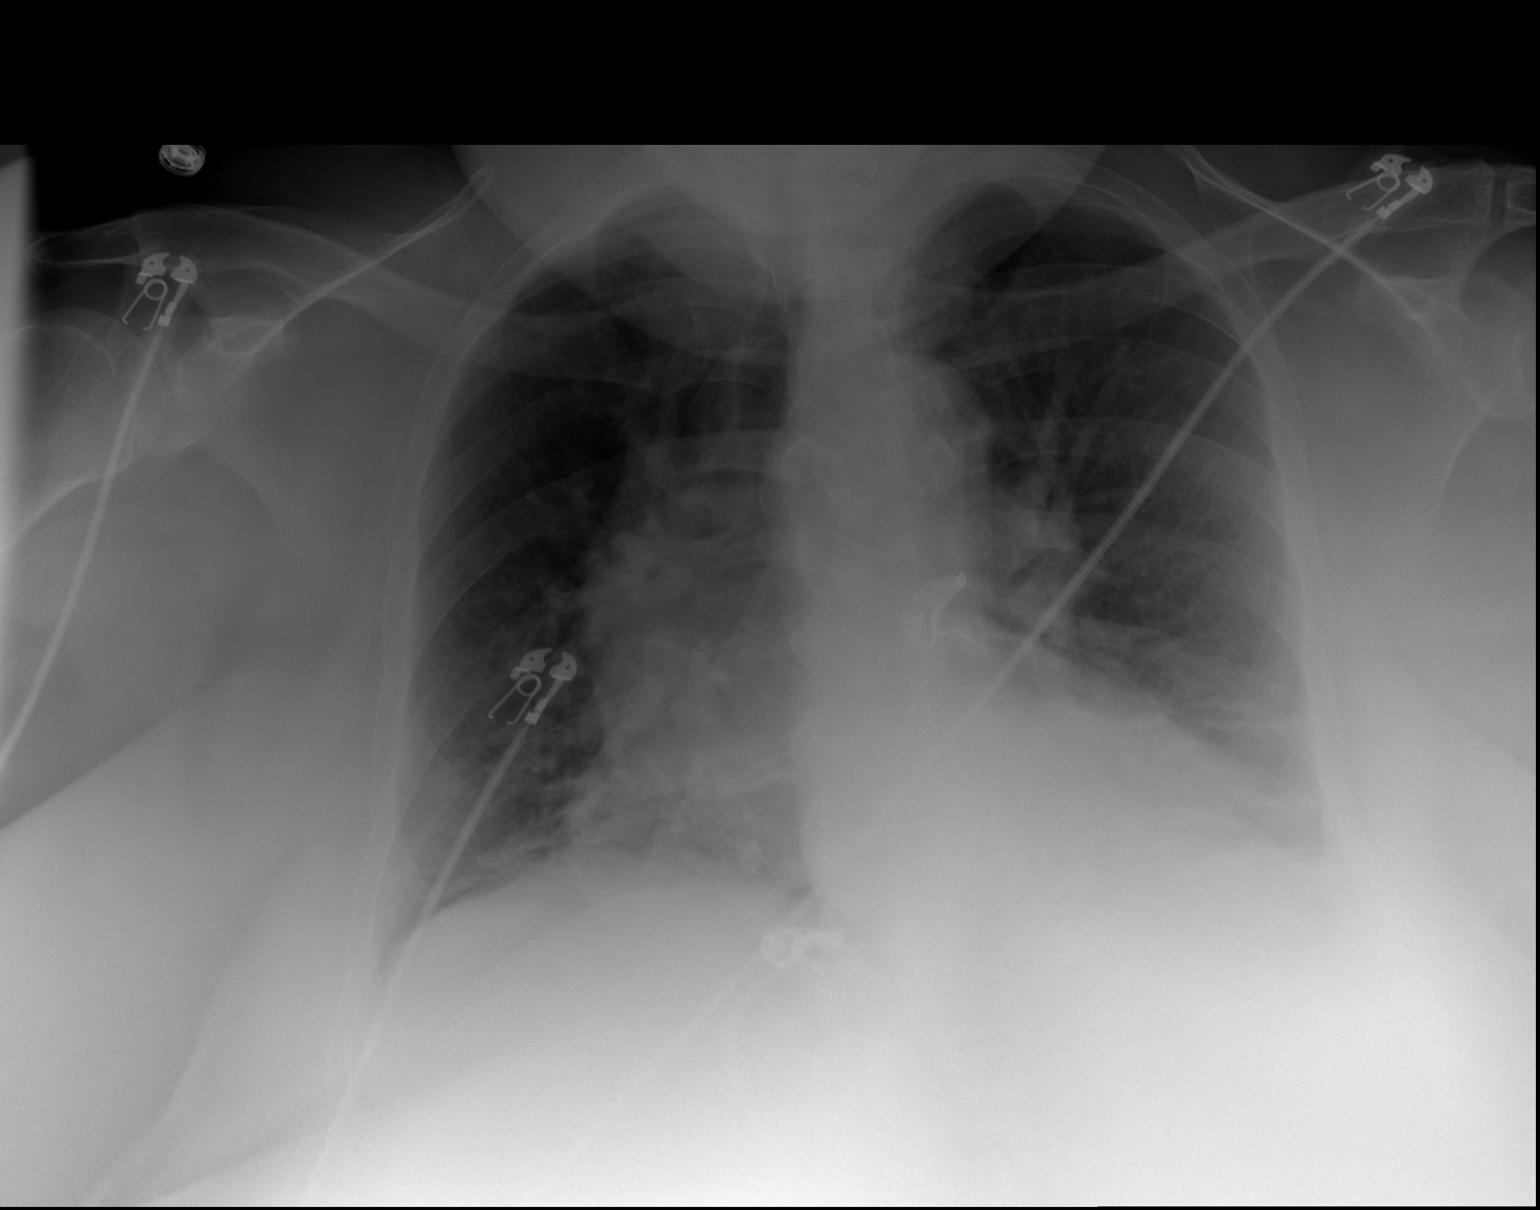

[w chest lat]
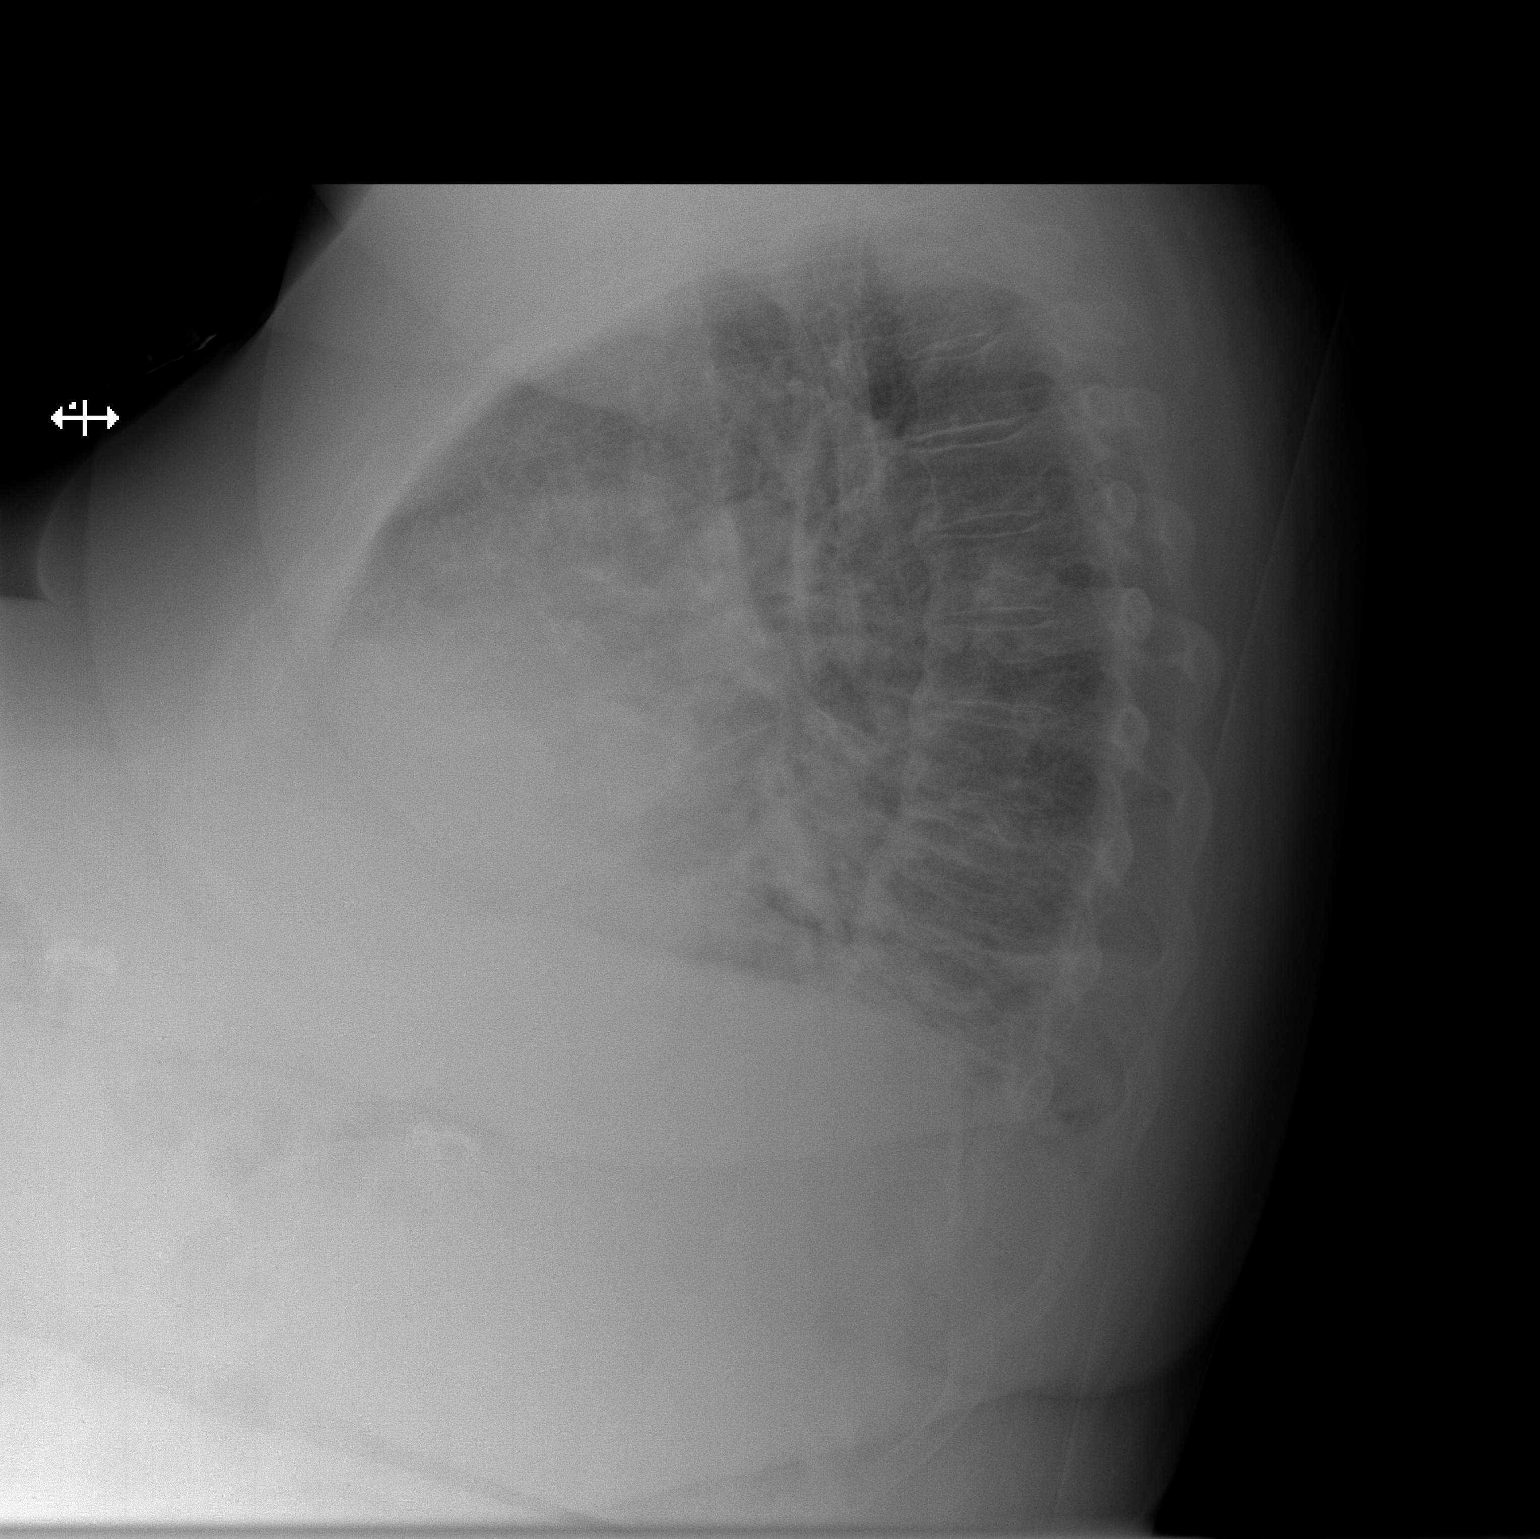

[2 of 2 positions shown; findings below may reference images not displayed]

FINDINGS: Stable mild to moderate severity areas of bibasilar scarring and/or
atelectasis are seen. There is no evidence of a pleural effusion or
pneumothorax. The cardiac silhouette is moderately enlarged.
Degenerative changes seen throughout the thoracic spine.
IMPRESSION: Stable cardiomegaly with mild to moderate severity bibasilar
scarring and/or atelectasis.

## 2023-04-19 IMAGING — CR DG KNEE COMPLETE 4+V*L*
4 series · 4 of 4 positions shown · non-contrast
Comparison: None.

CLINICAL DATA: Atraumatic bilateral knee pain and swelling.

EXAM:
LEFT KNEE - COMPLETE 4+ VIEW

[x knee ap left (1 of 4)]
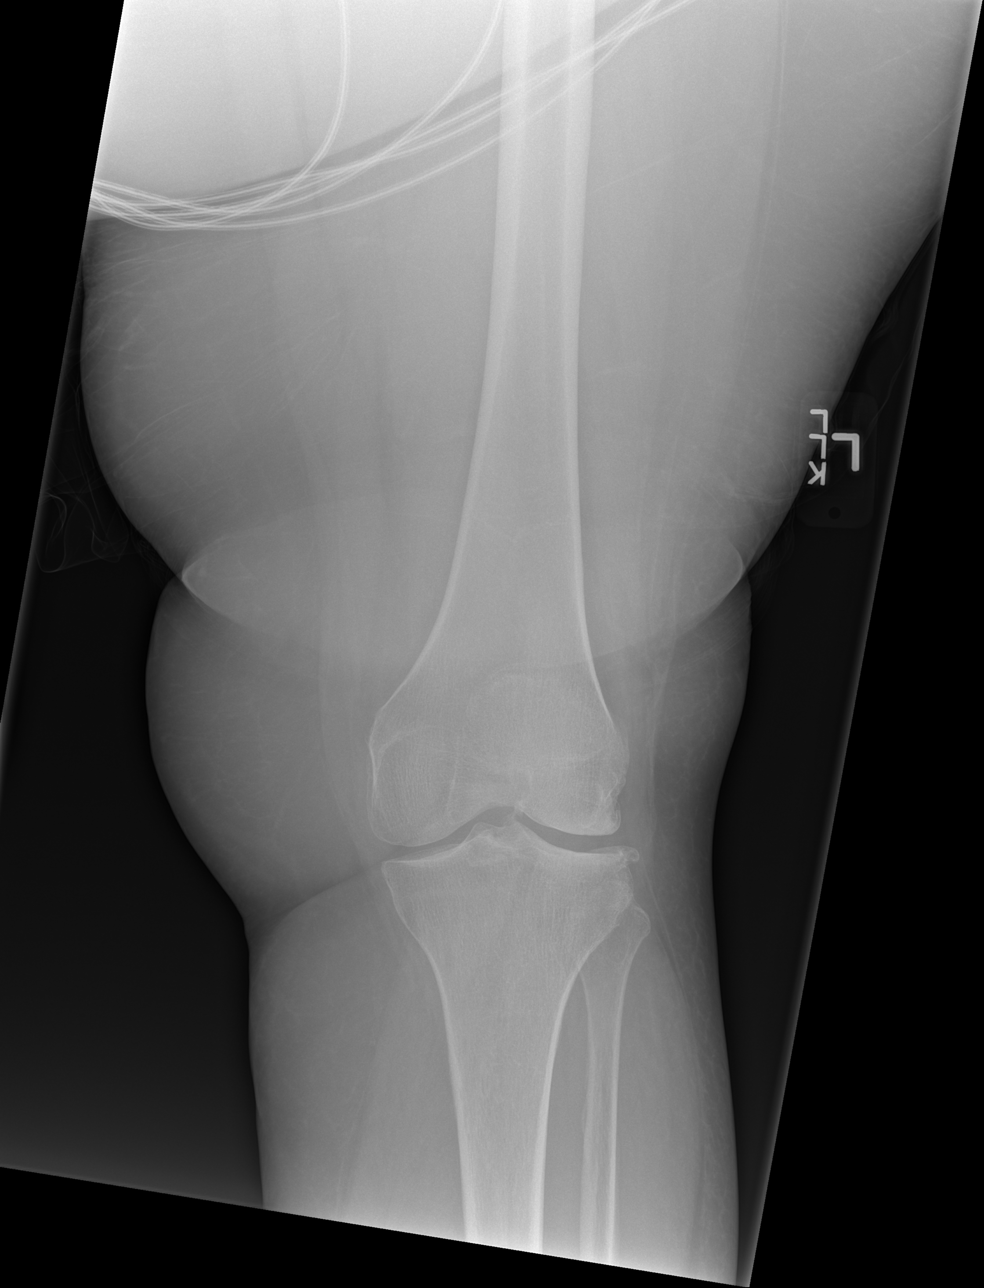

[x knee ap left (2 of 4)]
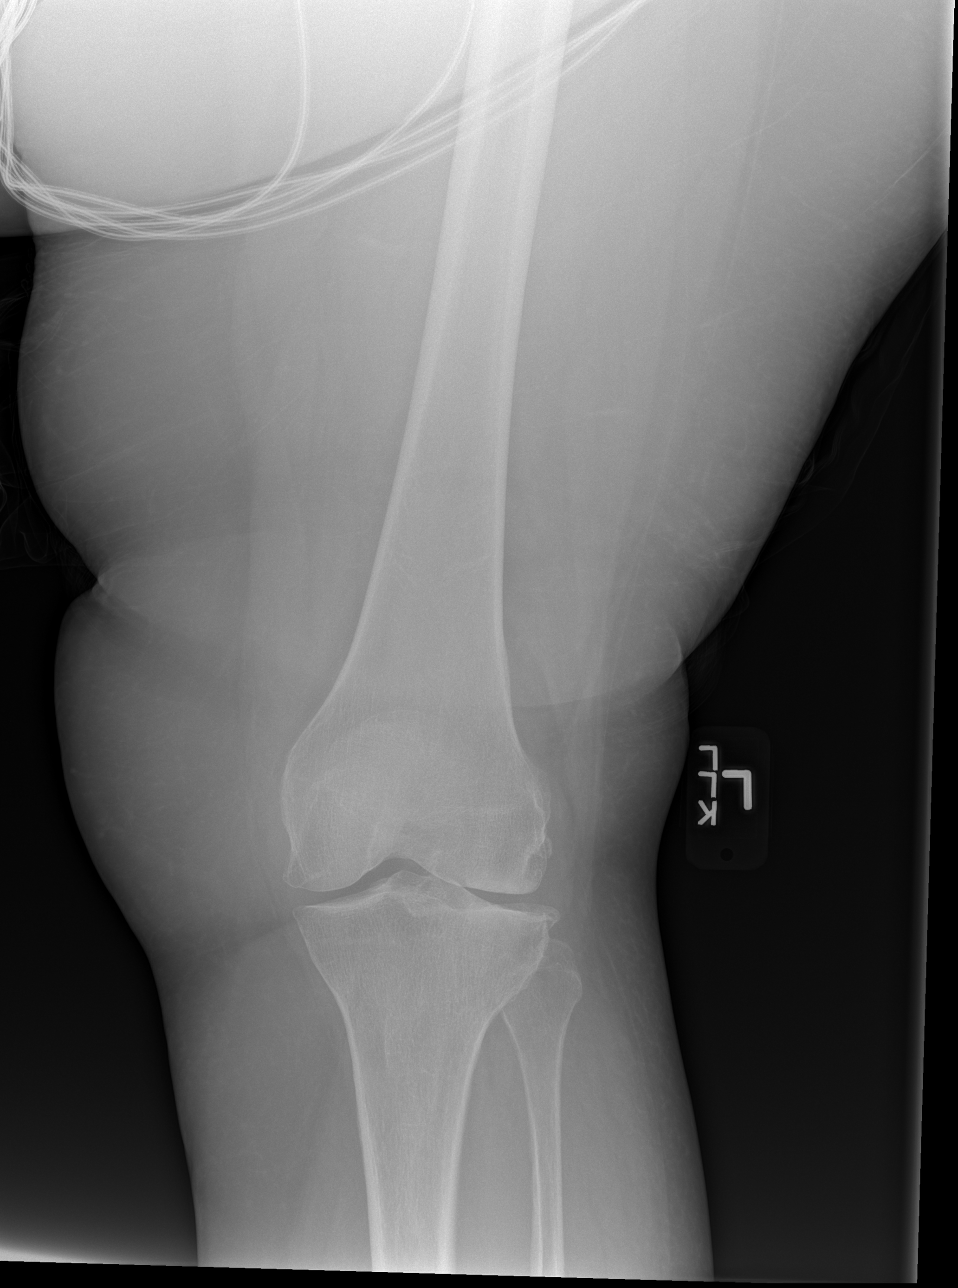

[x knee ap left (3 of 4)]
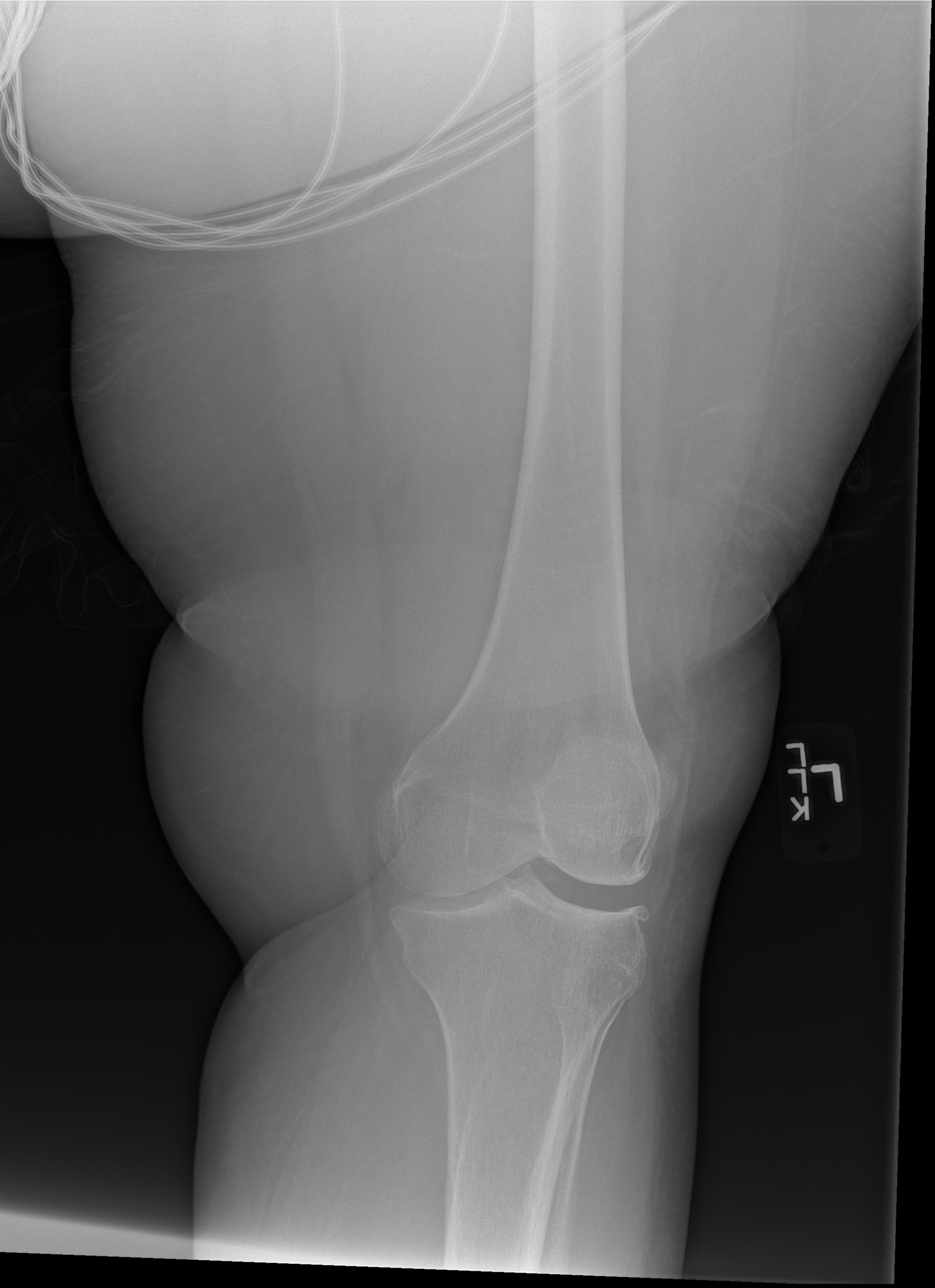

[x knee ap left (4 of 4)]
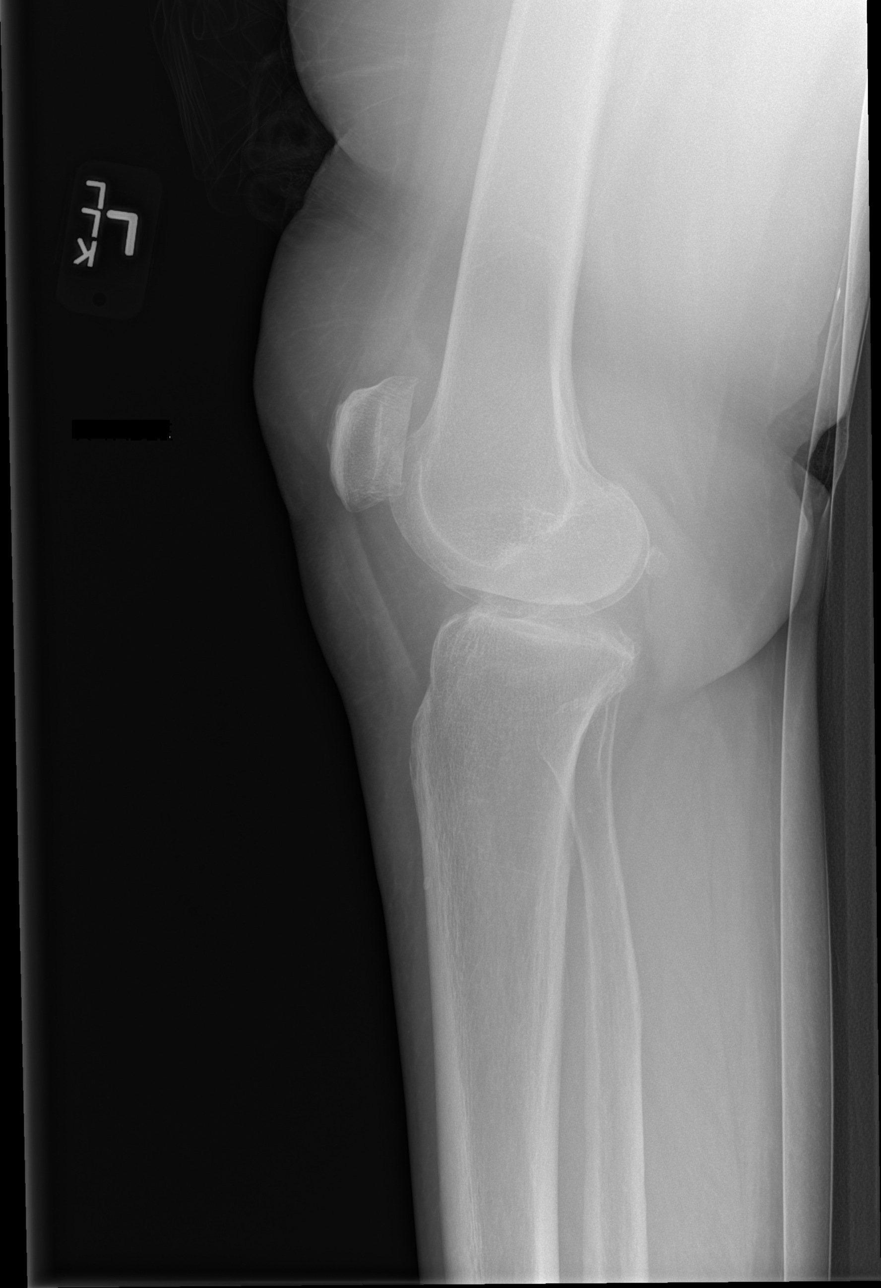

[4 of 4 positions shown; findings below may reference images not displayed]

FINDINGS: No evidence of acute fracture or dislocation. Mild medial and
lateral tibiofemoral compartment space narrowing is seen. Mild
patellofemoral narrowing is also noted. There is a small joint
effusion.
IMPRESSION: Small joint effusion without an acute osseous abnormality.

## 2023-04-24 ENCOUNTER — Ambulatory Visit: Payer: Medicaid Other | Admitting: Nurse Practitioner

## 2023-04-27 IMAGING — CR DG LUMBAR SPINE 2-3V
3 series · 3 of 3 positions shown · non-contrast
Comparison: None.

CLINICAL DATA: Back pain after fall.

EXAM:
LUMBAR SPINE - 2-3 VIEW

[l-spine ap]
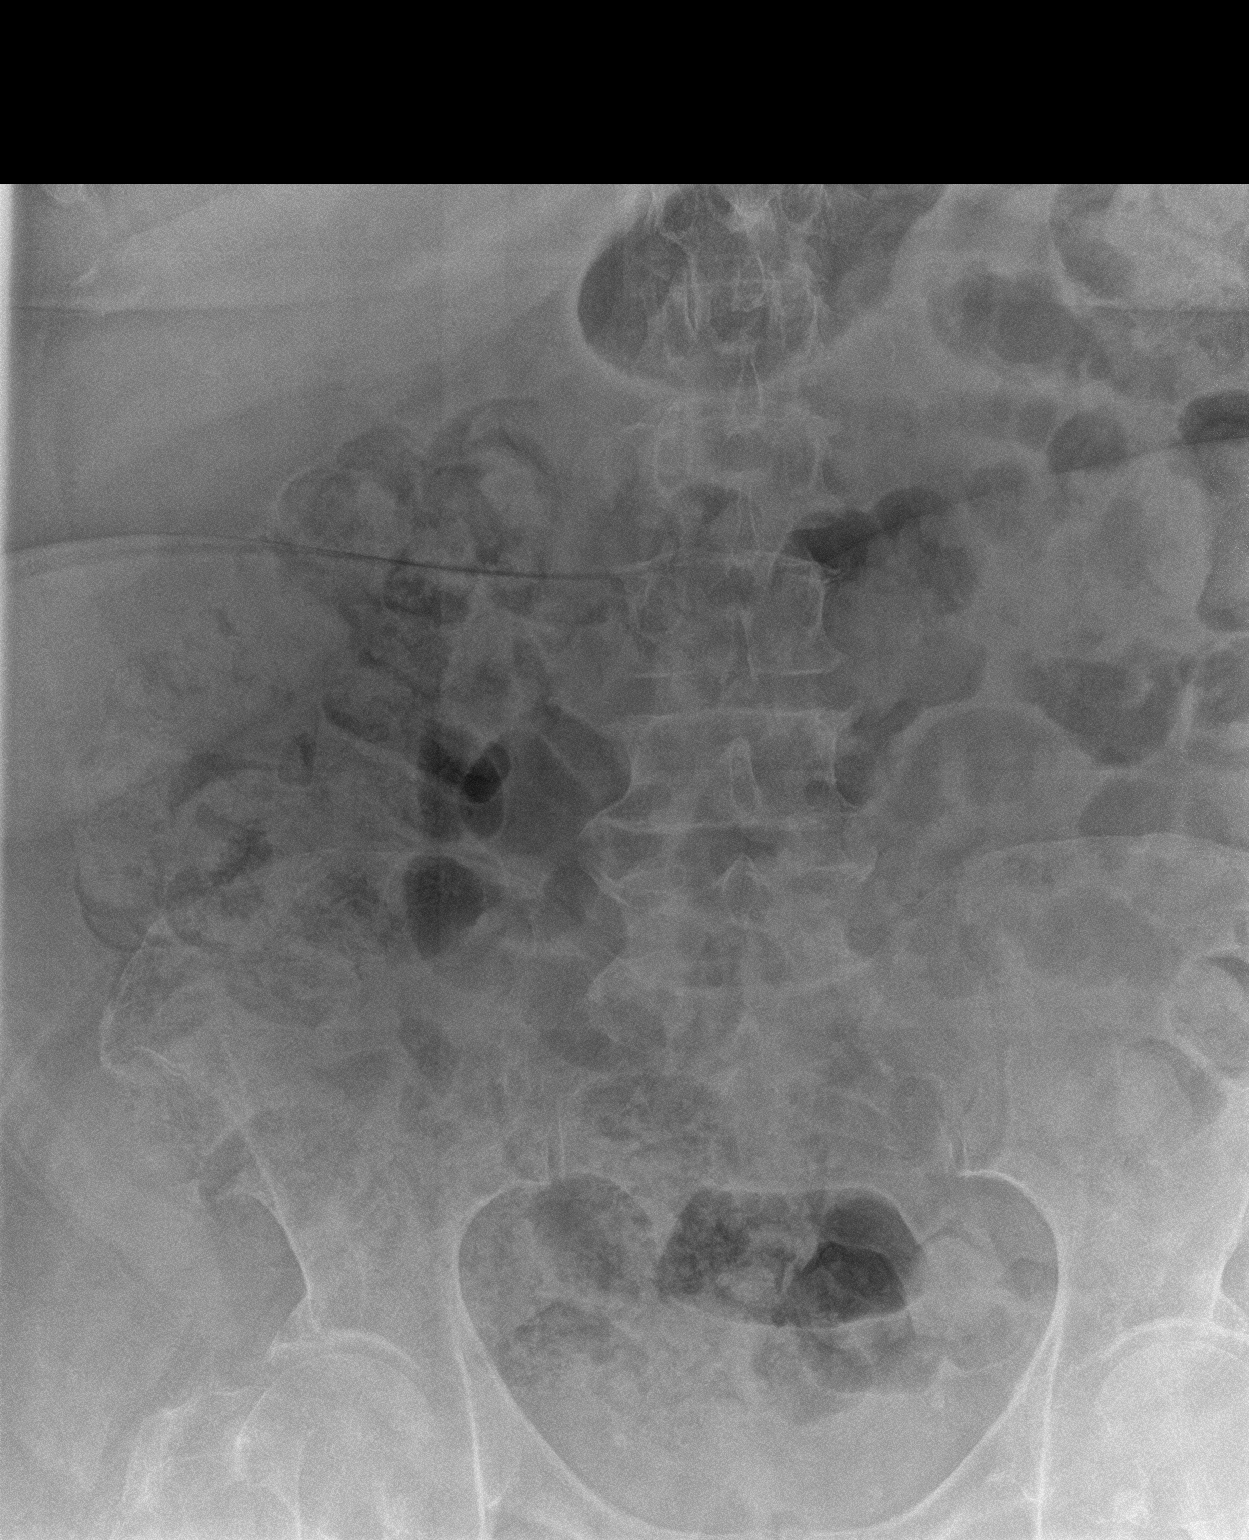

[l-spine lat (1 of 2)]
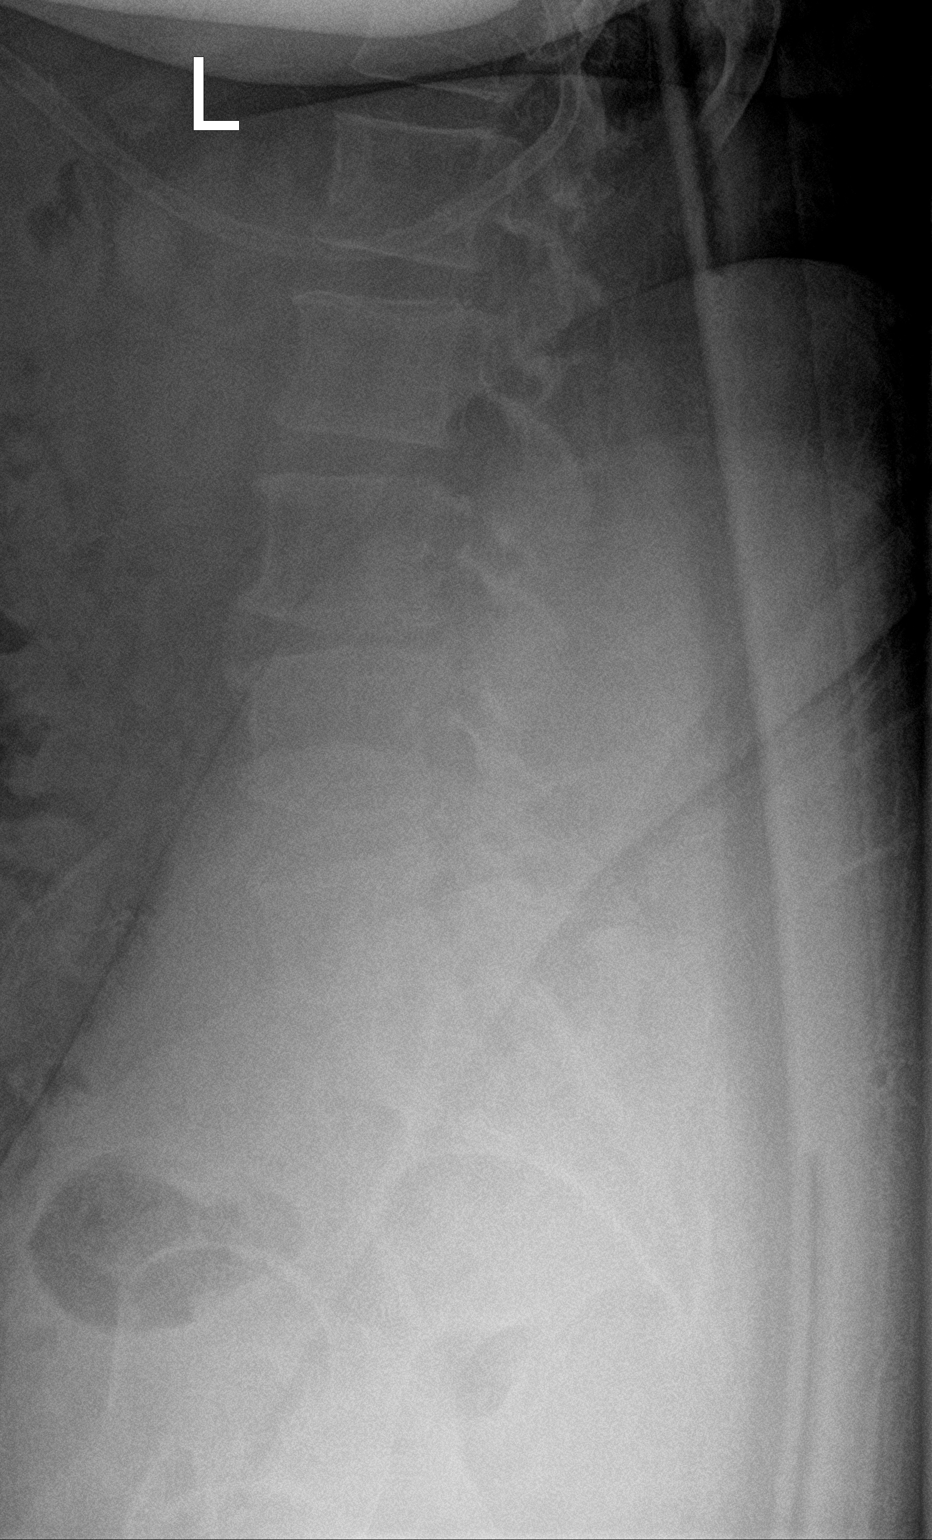

[l-spine lat (2 of 2)]
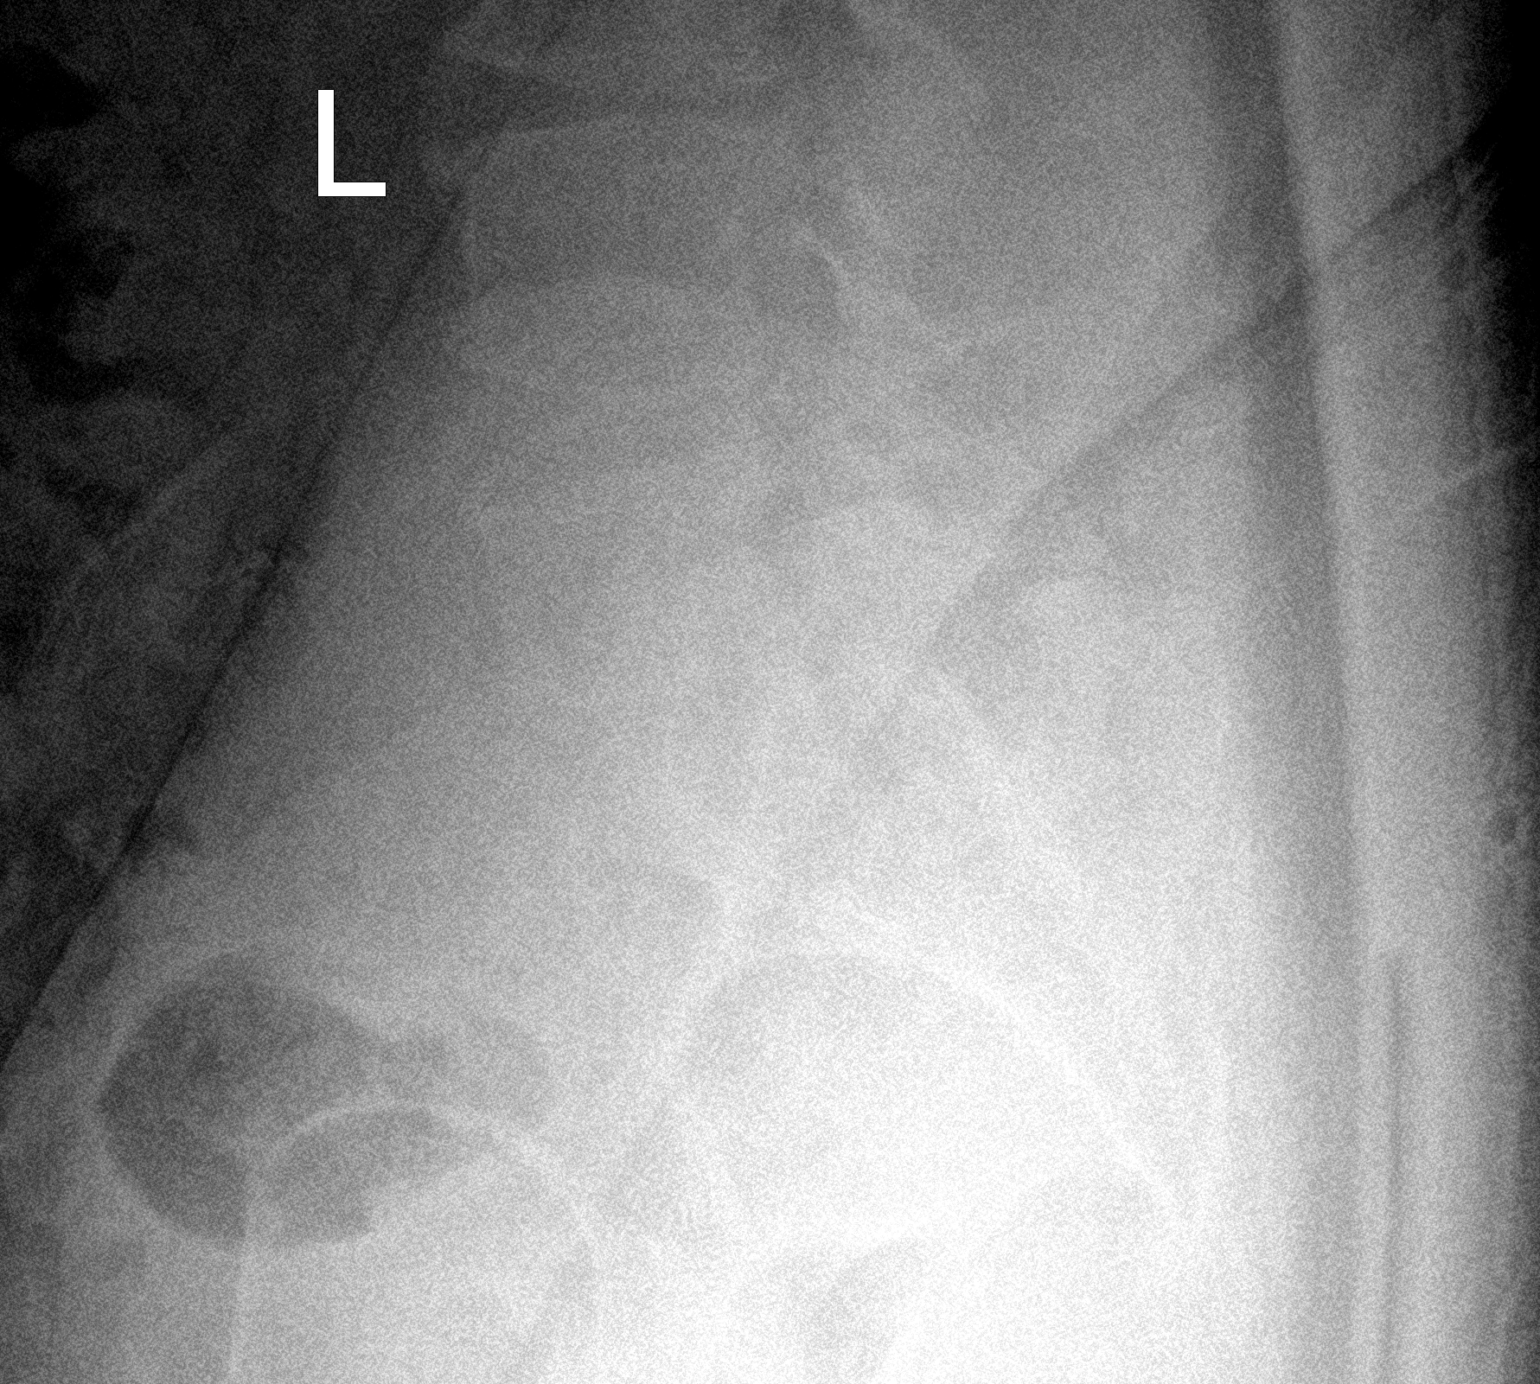

[3 of 3 positions shown; findings below may reference images not displayed]

FINDINGS: Normal alignment. Five non-rib-bearing lumbar vertebra. No evidence
of fracture. Examination is technically limited due to soft tissue
attenuation from habitus, particularly at L5. Multilevel endplate
spurring with disc space narrowing at L3-L4. Suspected facet
hypertrophy at L3-L4. The sacroiliac joints are congruent.
IMPRESSION: 1. No evidence of acute fracture of the lumbar spine.
2. Multilevel degenerative disc disease and facet hypertrophy.
3. Technically limited exam.

## 2023-04-27 IMAGING — CR DG CHEST 2V
2 series · 2 of 2 positions shown · non-contrast
Comparison: Radiograph 08/13/2020.  Most recent CT 04/18/2020

CLINICAL DATA: Left rib pain after fall.

EXAM:
CHEST - 2 VIEW

[chest ap]
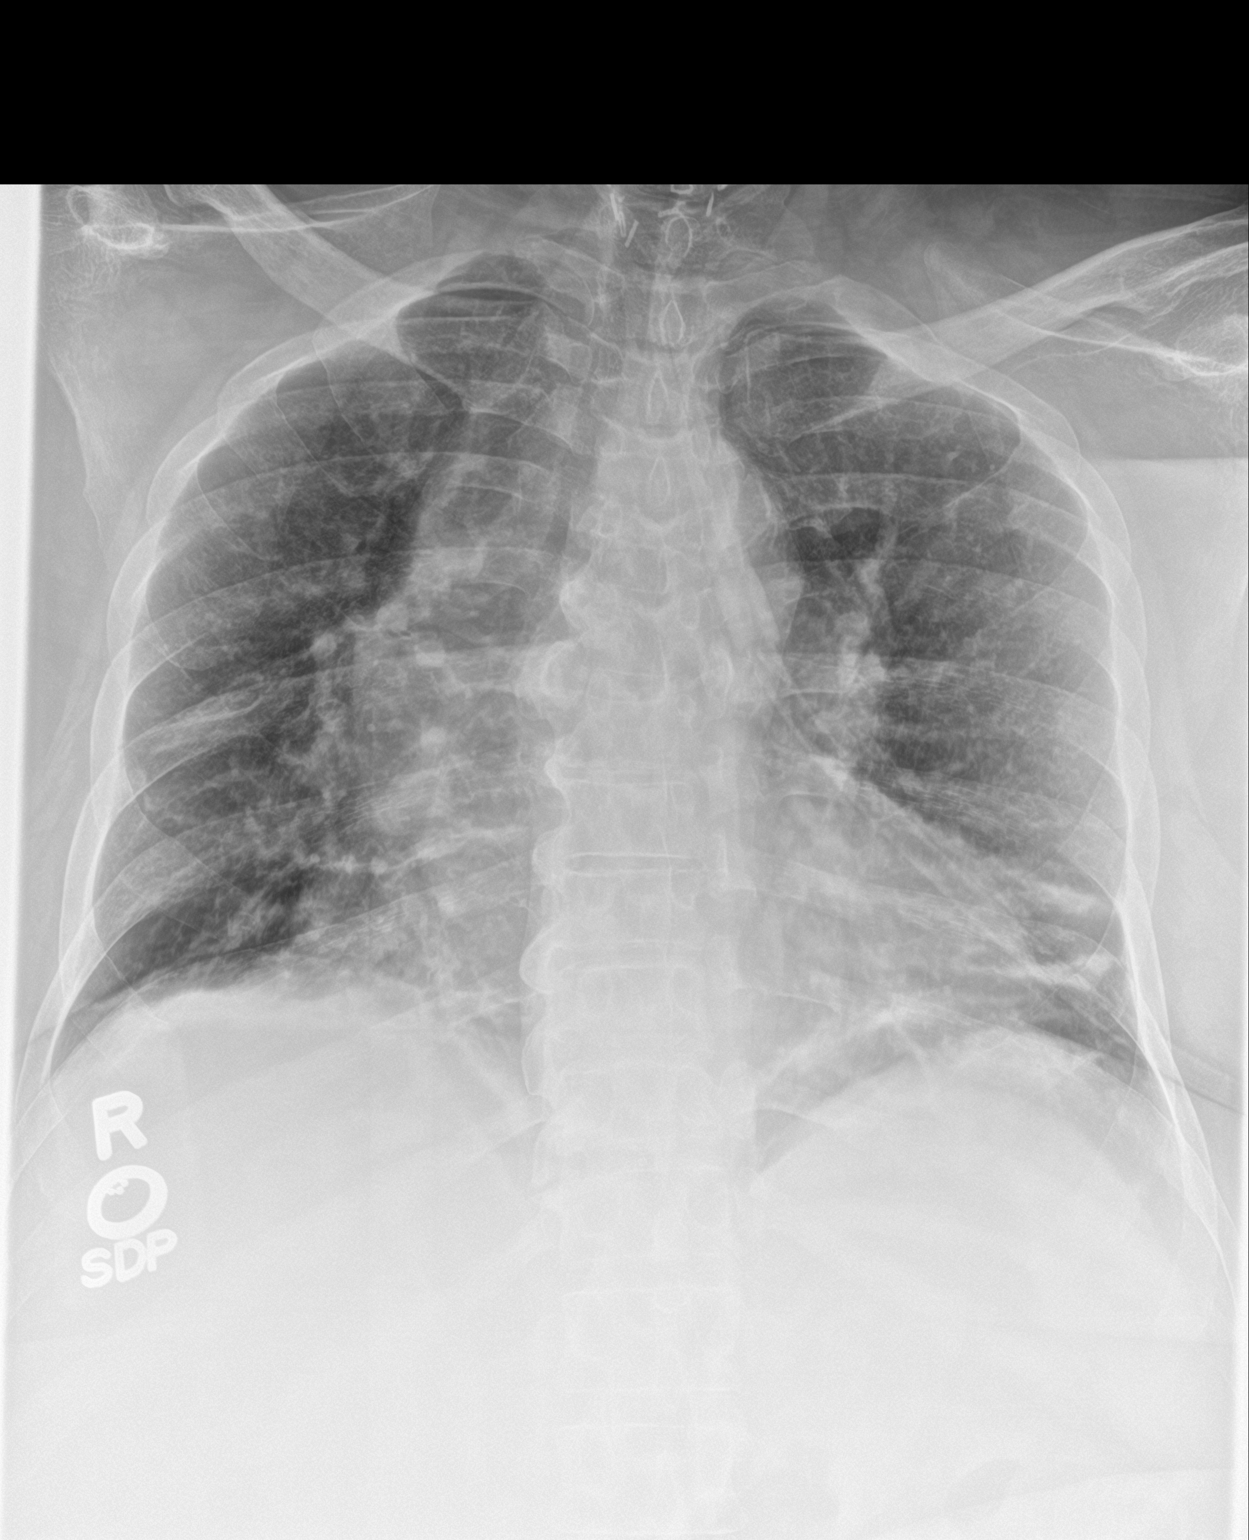

[chest lat]
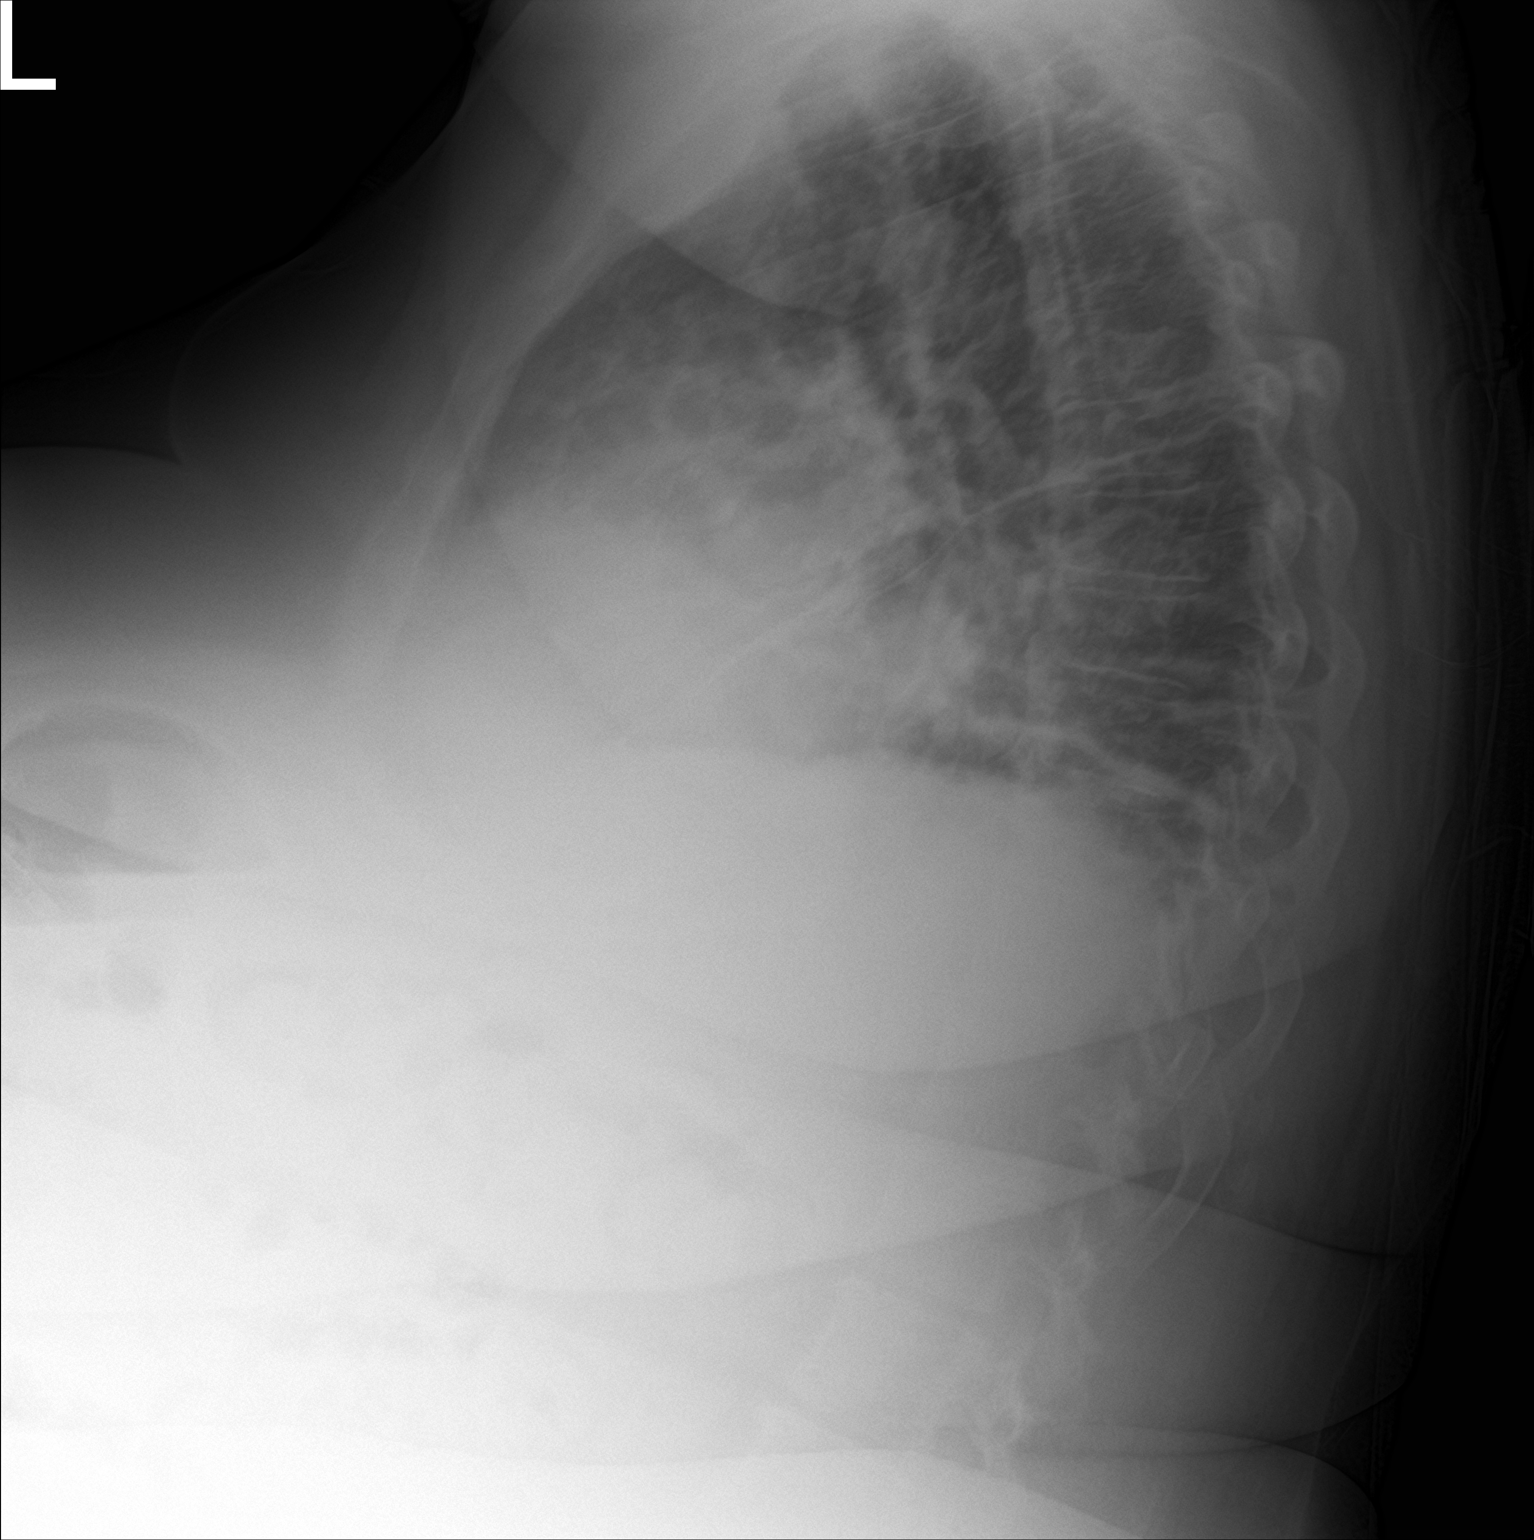

[2 of 2 positions shown; findings below may reference images not displayed]

FINDINGS: Stable cardiomegaly. Unchanged mediastinal contours. Streaky
bibasilar and right midlung opacities, atelectasis versus scarring.
No pneumothorax or pleural effusion. No acute osseous abnormalities
are seen. No visualized rib fracture.
IMPRESSION: 1. No acute findings.
2. Stable cardiomegaly. Streaky bibasilar and right midlung
opacities, atelectasis versus scarring.

## 2023-04-27 IMAGING — CR DG THORACIC SPINE 2V
4 series · 4 of 4 positions shown · non-contrast
Comparison: None.

CLINICAL DATA: Back pain after fall.  Rib pain.

EXAM:
THORACIC SPINE 2 VIEWS

[t-spine ap]
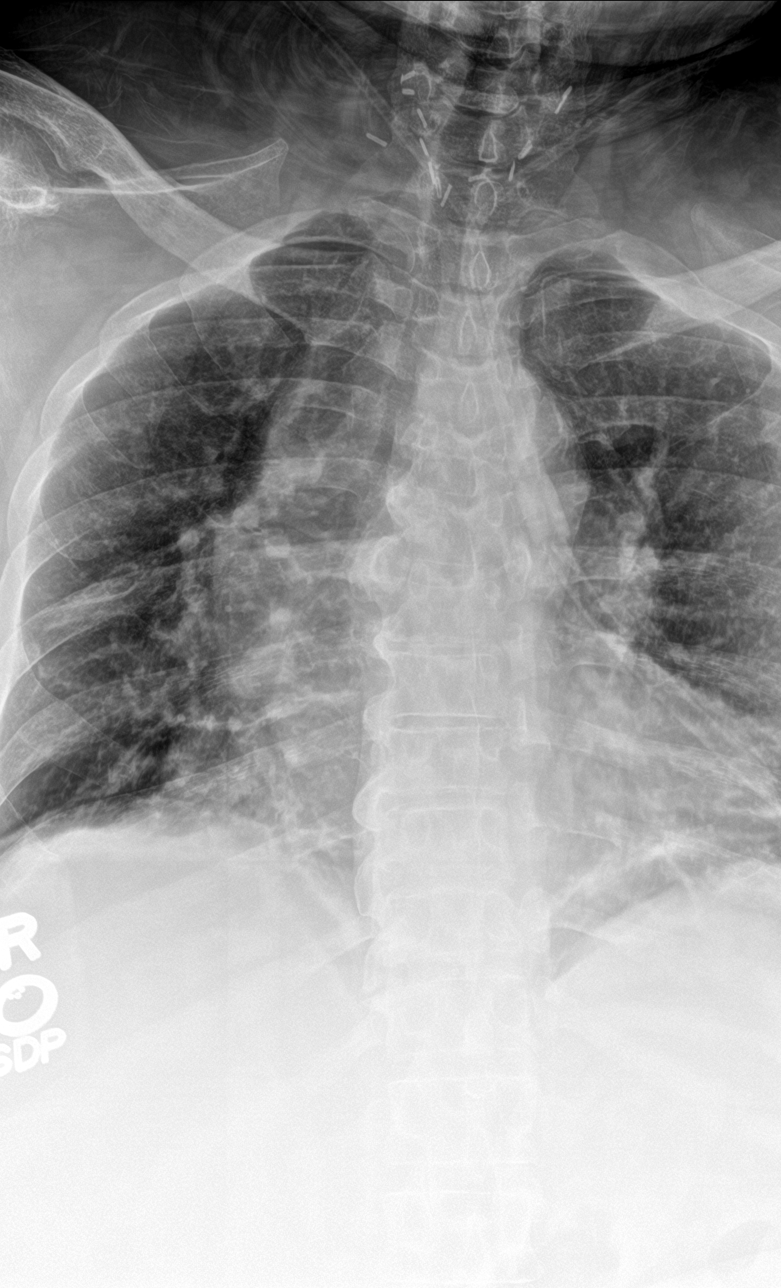

[t-spine lat (1 of 2)]
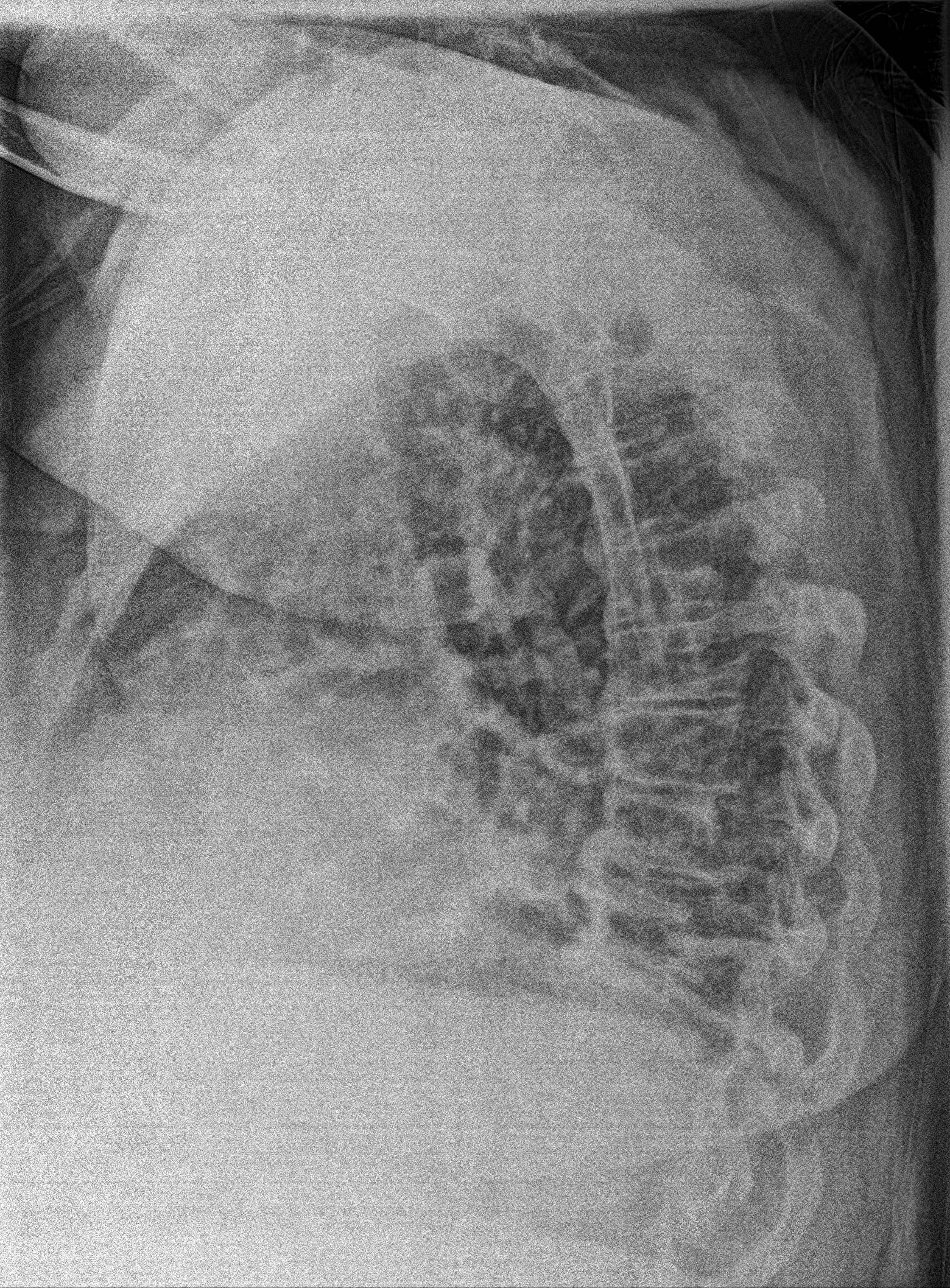

[t-spine swimmers]
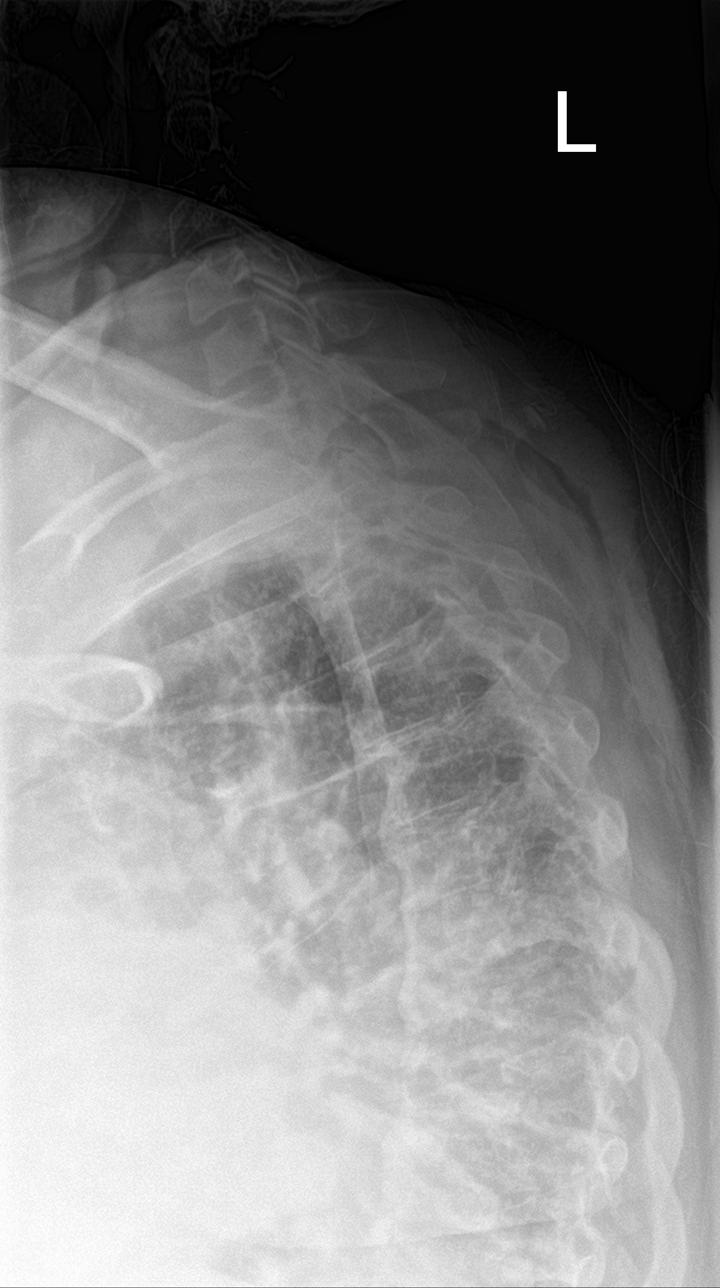

[t-spine lat (2 of 2)]
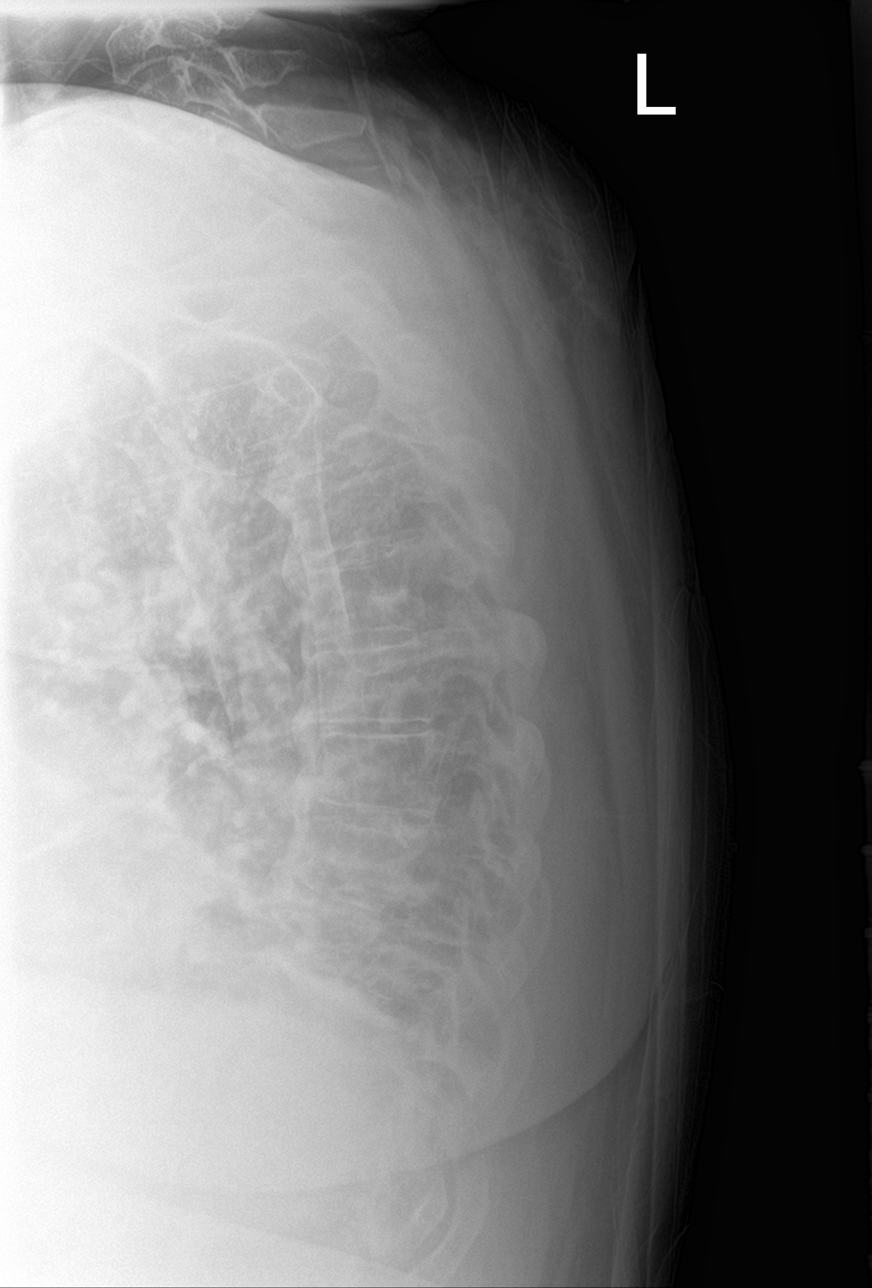

[4 of 4 positions shown; findings below may reference images not displayed]

FINDINGS: Normal alignment. No evidence of fracture, evaluation is technically
limited due to soft tissue attenuation from habitus, particularly at
the thoracolumbar junction. Multilevel endplate spurring. Vertebral
body heights are grossly normal. No paravertebral soft tissue
abnormality to suggest fracture.
IMPRESSION: 1. No evidence of thoracic spine fracture.
2. Mild degenerative change.
3. Technically limited evaluation.

## 2023-04-30 ENCOUNTER — Institutional Professional Consult (permissible substitution): Payer: Medicaid Other | Admitting: Pulmonary Disease

## 2023-05-01 NOTE — Progress Notes (Incomplete)
Primary Care: Avanell Shackleton, NP-C HF Cardiology: Dr Shirlee Latch  Michele Owens is a 63 y.o. with a history of  HTN, obesity, OSA, COPD, hyperlipidemia, diastolic CHF, gout, bipolar, and SVT who was referred to HF MD clinic from [name] clinic.     Admitted in 8/16 with diastolic CHF and COPD exacerbation.  She was admitted in 1/17 with COPD exacerbation.  She was set up for a Cardiolite in 2/17.  This showed a small, moderate intensity partially reversible inferolateral defect.  LHC/RHC in 3/17 showed no significant CAD, mildly elevated PCWP.  PFTs in 3/17 showed severe COPD.     Admitted 10/15-10/19/17 with AECOPD and A/C diastolic CHF.  Diuresed with IV lasix. Completed course for CAP. Pt also noted have multinodular goiter on [place].  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    Echo in 4/18 showed EF 45-50%, moderate dilated RV with moderately decreased systolic function.    She was admitted again in 4/18 with COPD exacerbation, acute on chronic diastolic CHF, and CAP.     She was admitted in 3/20 with acute on chronic diastolic CHF and treated with IV Lasix.  Echo showed EF 60-65%, mild RV enlargement with normal function, respirophasic septal variation.      Echo in 5/21 showed LVEF 55-60%, RV normal, RVSP was normal.  RHC in 6/21 showed mean RA 11, PA 44/14 mean 27, mean PCWP 15, CI 4.6, PVR 1.1.   Echo in 2/24 showed EF 55-60%, mild LVH, mildly D-shaped septum.    Most recent admission was in 9/24 with somnolence/hypoxemia in the setting of hypercapnia likely from polypharmacy. She was put on Bipap and improved.   Patient has been doing reasonably well since last discharge.  She is doing some walking for exercise but does use a walker for balance.  No dyspnea walking short distances, more limited by knee pain. Weight is down 6 lbs.  She is not smoking or drinking ETOH.  She is wearing 2 L home oxygen at all times.  Occasional palpitations noted at night while lying in  bed.  No chest pain.    ECG (personally reviewed): NSR, nonspecific T wave flattening  Review of Systems: All systems reviewed and negative except as per HPI.   PMH: 1. Back and hip pain 2. HTN 3. Obesity 4. GERD 5. Hyperlipidemia 6. Depression 7. COPD: Has quit smoking. Now on home oxygen 2L [place].  PFTs (3/17) with severe COPD.  8. 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 Event monitor (12/14) with only NSR noted.  10. Chest pain: Atypical.  ETT-Cardiolite (12/13) with EF 60%, no ischemia/infarction. Lexiscan Cardiolite (2/17) with small, moderate-intensity partially reversible inferolateral defect. LHC (3/17): No significant CAD. 11. Syncope: Uncertain etiology.   12. OHS/OSA: Cannot tolerate CPAP. Sleep study negative for OSA in 5/17.  13. Gout 14. Migraines 15. Diabetes 16. Diastolic CHF: Echo (8/16) with EF 55-60%, grade II diastolic dysfunction.  - RHC (4/09) with mean RA 8, PA 45/20 mean 30, mean PCWP 20; PVR 1.5 WU.  - Echo (4/18): EF 45-50%, diffuse hypokinesis, grade II diastolic dysfunction, moderately dilated RV with moderately decreased systolic function. - Echo (4/20): EF 60-65%, mild RV enlargement with normal systolic function. Respirophasic septal variation noted.   - RHC (6/21): mean RA 11, PA 44/14 mean 27, mean PCWP 15, CI 4.6, PVR 1.1.  - Echo (2/24): EF 55-60%, mild LVH, mildly D-shaped septum.   17. 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  18. Hypothyroidism   Current Outpatient Medications  Medication Sig Dispense Refill   acetaminophen (TYLENOL) 500 MG tablet Take 1,000 mg by mouth every 6 (six) hours as needed for moderate pain (pain score 4-6) or headache.     albuterol (VENTOLIN HFA) 108 (90 Base) MCG/ACT inhaler Inhale 1-2 puffs into the lungs every 6 (six) hours as needed for wheezing or shortness of breath. 1 each 0   allopurinol (ZYLOPRIM) 100 MG tablet Take 100 mg by mouth daily. (Patient not  taking: Reported on 02/02/2023)     aspirin EC 81 MG tablet Take 81 mg by mouth daily. Swallow whole. (Patient not taking: Reported on 02/07/2023)     blood glucose meter kit and supplies KIT Dispense based on patient and insurance preference. Use up to four times daily as directed. 1 each 0   budesonide (PULMICORT) 0.25 MG/2ML nebulizer solution Inhale 2 mLs (0.25 mg total) by nebulization 2 (two) times daily. (Patient not taking: Reported on 02/07/2023) 120 mL 0   busPIRone (BUSPAR) 5 MG tablet Take 1 tablet (5 mg total) by mouth 3 (three) times daily. 90 tablet 0   calcitRIOL (ROCALTROL) 0.5 MCG capsule Take 1 capsule (0.5 mcg total) by mouth daily. (Patient not taking: Reported on 02/02/2023) 30 capsule 0   carvedilol (COREG) 3.125 MG tablet Take 1 tablet (3.125 mg total) by mouth 2 (two) times daily with a meal. 60 tablet 11   clonazePAM (KLONOPIN) 2 MG tablet Take 1 tablet (2 mg total) by mouth 3 (three) times daily. 90 tablet 0   cyclobenzaprine (FLEXERIL) 10 MG tablet Take 10 mg by mouth 3 (three) times daily as needed for muscle spasms. (Patient not taking: Reported on 02/02/2023)     EPINEPHrine 0.3 mg/0.3 mL IJ SOAJ injection Inject 0.3 mg into the muscle once as needed for anaphylaxis.     FARXIGA 10 MG TABS tablet Take 1 tablet (10 mg total) by mouth daily before breakfast. 30 tablet 11   fenofibrate (TRICOR) 145 MG tablet Take 1 tablet (145 mg total) by mouth daily. 30 tablet 11   FEROSUL 325 (65 Fe) MG tablet Take 1 tablet (325 mg total) by mouth daily. 30 tablet 1   haloperidol (HALDOL) 5 MG tablet Take 1 tablet (5 mg total) by mouth at bedtime. (Patient not taking: Reported on 02/02/2023) 30 tablet 0   levocetirizine (XYZAL) 5 MG tablet Take 1 tablet (5 mg total) by mouth at bedtime. 30 tablet 0   levothyroxine (SYNTHROID) 88 MCG tablet Take 1 tablet (88 mcg total) by mouth daily. 30 tablet 1   lidocaine (LIDODERM) 5 % Place 1 patch onto the skin daily. Remove & Discard patch within  12 hours or as directed by MD (Patient not taking: Reported on 02/02/2023) 30 patch 0   loperamide (IMODIUM) 2 MG capsule Take 1 capsule (2 mg total) by mouth 4 (four) times daily as needed for diarrhea or loose stools. (Patient not taking: Reported on 02/02/2023) 120 capsule 0   losartan (COZAAR) 25 MG tablet Take 1 tablet (25 mg total) by mouth daily. 30 tablet 11   nitroGLYCERIN (NITROSTAT) 0.4 MG SL tablet Place 1 tablet (0.4 mg total) under the tongue every 5 (five) minutes as needed for chest pain. (Patient not taking: Reported on 02/07/2023) 12 tablet 0   nystatin cream (MYCOSTATIN) Apply 1 Application topically 2 (two) times daily as needed for dry skin. (Patient not taking: Reported on 02/02/2023)  omeprazole (PRILOSEC) 20 MG capsule Take 1 capsule (20 mg total) by mouth 2 (two) times daily as needed (acid reflux). 60 capsule 0   ondansetron (ZOFRAN) 4 MG tablet Take 4 mg by mouth every 8 (eight) hours as needed for nausea or vomiting. (Patient not taking: Reported on 02/02/2023)     OXYGEN Inhale 3 L into the lungs continuous.     polyethylene glycol (MIRALAX / GLYCOLAX) 17 g packet Take 17 g by mouth daily as needed for mild constipation. (Patient not taking: Reported on 02/02/2023) 30 each 0   potassium chloride (KLOR-CON M) 10 MEQ tablet Take 1 tablet (10 mEq total) by mouth 2 (two) times daily. 60 tablet 1   rosuvastatin (CRESTOR) 10 MG tablet Take 1 tablet (10 mg total) by mouth every evening. 30 tablet 11   senna-docusate (SENOKOT-S) 8.6-50 MG tablet Take 1 tablet by mouth daily. 30 tablet 5   spironolactone (ALDACTONE) 25 MG tablet Take 1 tablet (25 mg total) by mouth daily. 30 tablet 11   SUBOXONE 4-1 MG FILM Place 0.5-1 Film under the tongue 3 (three) times daily as needed (Withdrawal). (Patient not taking: Reported on 02/07/2023)     torsemide (DEMADEX) 20 MG tablet Take 2 tablets (40 mg total) by mouth 2 (two) times daily. 120 tablet 11   traZODone (DESYREL) 50 MG tablet Take 2  tablets (100 mg total) by mouth at bedtime. 60 tablet 0   umeclidinium-vilanterol (ANORO ELLIPTA) 62.5-25 MCG/ACT AEPB Inhale 1 puff into the lungs daily. 98 each 0   No current facility-administered medications for this visit.    Allergies  Allergen Reactions   Bee Venom Swelling and Other (See Comments)    "All over my body" (swelling)   Fioricet [Butalbital-Apap-Caffeine] Nausea And Vomiting and Rash   Ibuprofen Rash and Other (See Comments)    Severe rash   Lamisil [Terbinafine] Rash and Other (See Comments)    Pt states this causes her to "feel funny"   Nsaids Other (See Comments)    Per MD's orders       Social History   Socioeconomic History   Marital status: Single    Spouse name: Not on file   Number of children: 1   Years of education: Not on file   Highest education level: Not on file  Occupational History   Occupation: disabled  Tobacco Use   Smoking status: Former    Current packs/day: 0.00    Average packs/day: 0.5 packs/day for 41.0 years (20.5 ttl pk-yrs)    Types: Cigarettes    Start date: 07/1975    Quit date: 07/2016    Years since quitting: 6.8   Smokeless tobacco: Never  Vaping Use   Vaping status: Never Used  Substance and Sexual Activity   Alcohol use: Not Currently    Comment: quit 12 years ago   Drug use: Not Currently    Types: Marijuana, Cocaine    Comment: quit 12 years ago   Sexual activity: Yes  Other Topics Concern   Not on file  Social History Narrative   Not on file   Social Drivers of Health   Financial Resource Strain: Low Risk  (10/24/2022)   Overall Financial Resource Strain (CARDIA)    Difficulty of Paying Living Expenses: Not very hard  Food Insecurity: Food Insecurity Present (12/14/2022)   Hunger Vital Sign    Worried About Running Out of Food in the Last Year: Often true    Ran Out of Food in the  Last Year: Often true  Transportation Needs: Unmet Transportation Needs (12/14/2022)   PRAPARE - Scientist, research (physical sciences) (Medical): Yes    Lack of Transportation (Non-Medical): Yes  Physical Activity: Inactive (02/01/2021)   Exercise Vital Sign    Days of Exercise per Week: 0 days    Minutes of Exercise per Session: 0 min  Stress: Stress Concern Present (02/23/2021)   [employer] of Occupational Health - Occupational Stress Questionnaire    Feeling of Stress : To some extent  Social Connections: Unknown (08/21/2021)   Received from [facility]   Social Network    Social Network: Not on file  Intimate Partner Violence: Not At Risk (12/14/2022)   Humiliation, Afraid, Rape, and Kick questionnaire    Fear of Current or Ex-Partner: No    Emotionally Abused: No    Physically Abused: No    Sexually Abused: No      Family History  Problem Relation Age of Onset   Cancer Father        thought to be due to exposure to concrete   Diabetes Mother    Thyroid cancer Mother        dx in her 9s-60s   Cancer Maternal Uncle        2 uncles with cancer NOS   Brain cancer Paternal Aunt    Cancer Cousin        maternal first cousin - NOS   Cancer Cousin        maternal first cousin - NOS    There were no vitals filed for this visit.  Wt Readings from Last 3 Encounters:  03/08/23 122 kg (268 lb 15.4 oz)  02/07/23 122.4 kg (269 lb 12.8 oz)  02/02/23 125.8 kg (277 lb 6.4 oz)    PHYSICAL EXAM: General: NAD Neck: JVP difficult, no thyromegaly or thyroid nodule.  Lungs: Clear to auscultation bilaterally with normal respiratory effort. CV: Nondisplaced PMI.  Heart regular S1/S2, no S3/S4, no murmur.  No peripheral edema.  No carotid bruit.  Normal pedal pulses.  Abdomen: Soft, nontender, no hepatosplenomegaly, no distention.  Skin: Intact without lesions or rashes.  Neurologic: Alert and oriented x 3.  Psych: Normal affect. Extremities: No clubbing or cyanosis.  HEENT: Normal.   ASSESSMENT & PLAN: 1. Chronic diastolic CHF: Has had multiple prior admissions with CHF and  COPD exacerbations.  Echo 05/2022 with EF 55-60%, D-shaped septum, Grade IDD.  NYHA class III but stable.  Not significantly volume overloaded on exam.  - Continue torsemide 40 mg bid, BMET/BNP today.  - Add Farxiga 10 mg daily.  BMET in 10 days.   - Continue losartan 25 mg daily.  - Continue spironolactone 25 mg daily.  - I think she would benefit from [facility] placement given multiple admissions for CHF and difficult exam.  We discussed risks and benefits and she agrees to placement. I will see if her insurance will cover.  2. HTN: BP controlled today.  3. Obesity: There is no height or weight on file to calculate BMI. - Would refer in future for GLP-1 agonist.  4. Chronic Hypoxemic Respiratory Failure:  Suspect due to COPD and OHS. She is on 2L home oxygen.  - Should follow with pulmonary.   [facility] 05/01/2023

## 2023-05-03 ENCOUNTER — Telehealth (HOSPITAL_COMMUNITY): Payer: Self-pay

## 2023-05-03 NOTE — Telephone Encounter (Signed)
Called to confirm/remind patient of their appointment at the [facility] on 05/04/23. However, patient's phone number was out of service.

## 2023-05-04 ENCOUNTER — Encounter (HOSPITAL_COMMUNITY): Payer: Medicaid Other

## 2023-05-07 ENCOUNTER — Other Ambulatory Visit: Payer: Self-pay | Admitting: Family Medicine

## 2023-05-07 DIAGNOSIS — J449 Chronic obstructive pulmonary disease, unspecified: Secondary | ICD-10-CM

## 2023-05-08 ENCOUNTER — Other Ambulatory Visit: Payer: Self-pay

## 2023-05-08 ENCOUNTER — Emergency Department (HOSPITAL_COMMUNITY): Payer: Medicaid Other

## 2023-05-08 ENCOUNTER — Emergency Department (HOSPITAL_COMMUNITY)
Admission: EM | Admit: 2023-05-08 | Discharge: 2023-05-09 | Disposition: A | Discharge status: Home / Self Care | Payer: Medicaid Other

## 2023-05-08 DIAGNOSIS — M1710 Unilateral primary osteoarthritis, unspecified knee: Secondary | ICD-10-CM | POA: Diagnosis not present

## 2023-05-08 DIAGNOSIS — W19XXXA Unspecified fall, initial encounter: Secondary | ICD-10-CM | POA: Diagnosis not present

## 2023-05-08 DIAGNOSIS — Z79899 Other long term (current) drug therapy: Secondary | ICD-10-CM | POA: Insufficient documentation

## 2023-05-08 DIAGNOSIS — E119 Type 2 diabetes mellitus without complications: Secondary | ICD-10-CM | POA: Insufficient documentation

## 2023-05-08 DIAGNOSIS — M25561 Pain in right knee: Secondary | ICD-10-CM | POA: Diagnosis present

## 2023-05-08 DIAGNOSIS — Z7982 Long term (current) use of aspirin: Secondary | ICD-10-CM | POA: Insufficient documentation

## 2023-05-08 DIAGNOSIS — Z85850 Personal history of malignant neoplasm of thyroid: Secondary | ICD-10-CM | POA: Insufficient documentation

## 2023-05-08 DIAGNOSIS — I509 Heart failure, unspecified: Secondary | ICD-10-CM | POA: Diagnosis not present

## 2023-05-08 DIAGNOSIS — E039 Hypothyroidism, unspecified: Secondary | ICD-10-CM | POA: Insufficient documentation

## 2023-05-08 DIAGNOSIS — I110 Hypertensive heart disease with heart failure: Secondary | ICD-10-CM | POA: Diagnosis not present

## 2023-05-08 LAB — CBC
HCT: 40 % (ref 36.0–46.0)
Hemoglobin: 12.5 g/dL (ref 12.0–15.0)
MCH: 30.1 pg (ref 26.0–34.0)
MCHC: 31.3 g/dL (ref 30.0–36.0)
MCV: 96.4 fL (ref 80.0–100.0)
Platelets: 328 10*3/uL (ref 150–400)
RBC: 4.15 MIL/uL (ref 3.87–5.11)
RDW: 12.6 % (ref 11.5–15.5)
WBC: 16.5 10*3/uL — ABNORMAL HIGH (ref 4.0–10.5)
nRBC: 0 % (ref 0.0–0.2)

## 2023-05-08 LAB — COMPREHENSIVE METABOLIC PANEL
ALT: 13 U/L (ref 0–44)
AST: 14 U/L — ABNORMAL LOW (ref 15–41)
Albumin: 4.3 g/dL (ref 3.5–5.0)
Alkaline Phosphatase: 64 U/L (ref 38–126)
Anion gap: 17 — ABNORMAL HIGH (ref 5–15)
BUN: 25 mg/dL — ABNORMAL HIGH (ref 8–23)
CO2: 28 mmol/L (ref 22–32)
Calcium: 9.3 mg/dL (ref 8.9–10.3)
Chloride: 98 mmol/L (ref 98–111)
Creatinine, Ser: 1.02 mg/dL — ABNORMAL HIGH (ref 0.44–1.00)
GFR, Estimated: >60 mL/min (ref >60)
Glucose, Bld: 123 mg/dL — ABNORMAL HIGH (ref 70–99)
Potassium: 3.8 mmol/L (ref 3.5–5.1)
Sodium: 143 mmol/L (ref 135–145)
Total Bilirubin: 0.6 mg/dL (ref 0.0–1.2)
Total Protein: 7.9 g/dL (ref 6.5–8.1)

## 2023-05-08 LAB — CK: Total CK: 131 U/L (ref 38–234)

## 2023-05-08 MED ORDER — OXYCODONE HCL 5 MG PO TABS
10.0000 mg | ORAL_TABLET | Freq: Once | ORAL | Status: AC
  Administered 2023-05-08: 10 mg via ORAL
  Filled 2023-05-08: qty 2

## 2023-05-08 MED ORDER — BUTALBITAL-APAP-CAFFEINE 50-325-40 MG PO TABS: 1.0000 | ORAL_TABLET | Freq: Four times a day (QID) | ORAL | 0 refills | Status: DC | PRN

## 2023-05-08 NOTE — ED Provider Notes (Signed)
[place] EMERGENCY DEPARTMENT AT [facility] Provider Note   CSN: [id number] Arrival date & time: 05/08/23  1421     History  Chief Complaint  Patient presents with   Michele Owens is a 63 y.o. female history of OSA on CPAP, hypertension, hypothyroidism, chronic pain, diabetes, DVT during pregnancy, thyroid cancer, pulmonary hypertension, CHF presented after a fall that occurred yesterday.  Patient states that her left knee gave out.  Patient notes that she has significant arthritis in this knee and needs seen orthopedist.  Patient states that she did not hit her head or neck as her daughter caught her.  Patient not on blood thinners.  Patient does endorse low back pain after the fall but did not land on her back.  Patient denies saddle anesthesia or urinary/bowel incontinence or paresthesias.  Patient denies chest pain, shortness of breath, abdominal pain, fevers, recent illnesses, dysuria.    Home Medications Prior to Admission medications   Medication Sig Start Date End Date Taking? Authorizing Provider  butalbital-acetaminophen-caffeine (FIORICET) 50-325-40 MG tablet Take 1-2 tablets by mouth every 6 (six) hours as needed for headache. 05/08/23 05/07/24 Yes Virgle Arth, Beverly Gust, PA-C  acetaminophen (TYLENOL) 500 MG tablet Take 1,000 mg by mouth every 6 (six) hours as needed for moderate pain (pain score 4-6) or headache.    [provider]  albuterol (VENTOLIN HFA) 108 (90 Base) MCG/ACT inhaler Inhale 1-2 puffs into the lungs every 6 (six) hours as needed for wheezing or shortness of breath. 11/24/22   Laurey Morale, MD  allopurinol (ZYLOPRIM) 100 MG tablet Take 100 mg by mouth daily. Patient not taking: Reported on 02/02/2023    [provider]  aspirin EC 81 MG tablet Take 81 mg by mouth daily. Swallow whole. Patient not taking: Reported on 02/07/2023    [provider]  blood glucose meter kit and supplies KIT Dispense based on  patient and insurance preference. Use up to four times daily as directed. 12/25/20   Azucena Fallen, MD  budesonide (PULMICORT) 0.25 MG/2ML nebulizer solution Inhale 2 mLs (0.25 mg total) by nebulization 2 (two) times daily. Patient not taking: Reported on 02/07/2023 11/24/22   Laurey Morale, MD  busPIRone (BUSPAR) 5 MG tablet Take 1 tablet (5 mg total) by mouth 3 (three) times daily. 02/07/23   Henson, Vickie L, NP-C  calcitRIOL (ROCALTROL) 0.5 MCG capsule Take 1 capsule (0.5 mcg total) by mouth daily. Patient not taking: Reported on 02/02/2023 09/26/16   Elease Etienne, MD  carvedilol (COREG) 3.125 MG tablet Take 1 tablet (3.125 mg total) by mouth 2 (two) times daily with a meal. 02/02/23 03/04/23  Laurey Morale, MD  clonazePAM (KLONOPIN) 2 MG tablet Take 1 tablet (2 mg total) by mouth 3 (three) times daily. 11/24/22   Laurey Morale, MD  cyclobenzaprine (FLEXERIL) 10 MG tablet Take 10 mg by mouth 3 (three) times daily as needed for muscle spasms. Patient not taking: Reported on 02/02/2023 11/19/19   [provider]  EPINEPHrine 0.3 mg/0.3 mL IJ SOAJ injection Inject 0.3 mg into the muscle once as needed for anaphylaxis.    [provider]  FARXIGA 10 MG TABS tablet Take 1 tablet (10 mg total) by mouth daily before breakfast. 02/02/23   Laurey Morale, MD  fenofibrate (TRICOR) 145 MG tablet Take 1 tablet (145 mg total) by mouth daily. 02/02/23   Laurey Morale, MD  FEROSUL 325 (65 Fe) MG tablet  Take 1 tablet (325 mg total) by mouth daily. 02/09/23   Henson, Vickie L, NP-C  haloperidol (HALDOL) 5 MG tablet Take 1 tablet (5 mg total) by mouth at bedtime. Patient not taking: Reported on 02/02/2023 12/16/22 01/15/23  Willeen Niece, MD  levocetirizine (XYZAL) 5 MG tablet Take 1 tablet (5 mg total) by mouth at bedtime. 11/24/22   Laurey Morale, MD  levothyroxine (SYNTHROID) 88 MCG tablet Take 1 tablet (88 mcg total) by mouth daily. 02/09/23   Henson, Vickie L, NP-C   lidocaine (LIDODERM) 5 % Place 1 patch onto the skin daily. Remove & Discard patch within 12 hours or as directed by MD Patient not taking: Reported on 02/02/2023 10/12/22   Blue, Soijett A, PA-C  loperamide (IMODIUM) 2 MG capsule Take 1 capsule (2 mg total) by mouth 4 (four) times daily as needed for diarrhea or loose stools. Patient not taking: Reported on 02/02/2023 11/24/22   Laurey Morale, MD  losartan (COZAAR) 25 MG tablet Take 1 tablet (25 mg total) by mouth daily. 02/02/23   Laurey Morale, MD  nitroGLYCERIN (NITROSTAT) 0.4 MG SL tablet Place 1 tablet (0.4 mg total) under the tongue every 5 (five) minutes as needed for chest pain. Patient not taking: Reported on 02/07/2023 06/19/18   Clydie Braun, MD  nystatin cream (MYCOSTATIN) Apply 1 Application topically 2 (two) times daily as needed for dry skin. Patient not taking: Reported on 02/02/2023 09/26/22   [provider]  omeprazole (PRILOSEC) 20 MG capsule Take 1 capsule (20 mg total) by mouth 2 (two) times daily as needed (acid reflux). 11/24/22   Laurey Morale, MD  ondansetron (ZOFRAN) 4 MG tablet Take 4 mg by mouth every 8 (eight) hours as needed for nausea or vomiting. Patient not taking: Reported on 02/02/2023 01/16/22   [provider]  OXYGEN Inhale 3 L into the lungs continuous.    [provider]  polyethylene glycol (MIRALAX / GLYCOLAX) 17 g packet Take 17 g by mouth daily as needed for mild constipation. Patient not taking: Reported on 02/02/2023 11/24/22   Laurey Morale, MD  potassium chloride (KLOR-CON M) 10 MEQ tablet Take 1 tablet (10 mEq total) by mouth 2 (two) times daily. 04/12/23   Laurey Morale, MD  rosuvastatin (CRESTOR) 10 MG tablet Take 1 tablet (10 mg total) by mouth every evening. 02/02/23   Laurey Morale, MD  senna-docusate (SENOKOT-S) 8.6-50 MG tablet Take 1 tablet by mouth daily. 02/07/23   Henson, Vickie L, NP-C  spironolactone (ALDACTONE) 25 MG tablet Take 1 tablet (25 mg  total) by mouth daily. 02/02/23   Laurey Morale, MD  SUBOXONE 4-1 MG FILM Place 0.5-1 Film under the tongue 3 (three) times daily as needed (Withdrawal). Patient not taking: Reported on 02/07/2023 10/16/22   [provider]  torsemide (DEMADEX) 20 MG tablet Take 2 tablets (40 mg total) by mouth 2 (two) times daily. 11/24/22   Laurey Morale, MD  traZODone (DESYREL) 50 MG tablet Take 2 tablets (100 mg total) by mouth at bedtime. 02/07/23   Henson, Vickie L, NP-C  umeclidinium-vilanterol (ANORO ELLIPTA) 62.5-25 MCG/ACT AEPB Inhale 1 puff into the lungs daily. 02/09/23   Henson, Vickie L, NP-C      Allergies    Bee venom, Fioricet [butalbital-apap-caffeine], Ibuprofen, Lamisil [terbinafine], and Nsaids    Review of Systems   Review of Systems  Physical Exam Updated Vital Signs BP 125/65   Pulse 78   Temp 97.6  F (36.4 C)   Resp 18   Wt 122.5 kg   SpO2 100%   BMI 44.93 kg/m  Physical Exam Vitals reviewed.  Constitutional:      General: She is not in acute distress. HENT:     Head: Normocephalic and atraumatic.  Eyes:     Extraocular Movements: Extraocular movements intact.     Conjunctiva/sclera: Conjunctivae normal.     Pupils: Pupils are equal, round, and reactive to light.  Cardiovascular:     Rate and Rhythm: Normal rate and regular rhythm.     Pulses: Normal pulses.     Heart sounds: Normal heart sounds.     Comments: 2+ bilateral radial/dorsalis pedis pulses with regular rate Pulmonary:     Effort: Pulmonary effort is normal. No respiratory distress.     Breath sounds: Normal breath sounds.  Abdominal:     Palpations: Abdomen is soft.     Tenderness: There is no abdominal tenderness. There is no guarding or rebound.  Musculoskeletal:        General: Normal range of motion.     Cervical back: Normal range of motion and neck supple.     Comments: 5 out of 5 bilateral grip/leg extension strength  Skin:    General: Skin is warm and dry.     Capillary  Refill: Capillary refill takes less than 2 seconds.  Neurological:     General: No focal deficit present.     Mental Status: She is alert and oriented to person, place, and time.     Comments: Sensation intact in all 4 limbs  Psychiatric:        Mood and Affect: Mood normal.     ED Results / Procedures / Treatments   Labs (all labs ordered are listed, but only abnormal results are displayed) Labs Reviewed  COMPREHENSIVE METABOLIC PANEL - Abnormal; Notable for the following components:      Result Value   Glucose, Bld 123 (*)    BUN 25 (*)    Creatinine, Ser 1.02 (*)    AST 14 (*)    Anion gap 17 (*)    All other components within normal limits  CBC - Abnormal; Notable for the following components:   WBC 16.5 (*)    All other components within normal limits  CK  I-STAT VENOUS BLOOD GAS, ED    EKG None  Radiology CT Lumbar Spine Wo Contrast Result Date: 05/08/2023 CLINICAL DATA:  Back trauma EXAM: CT LUMBAR SPINE WITHOUT CONTRAST TECHNIQUE: Multidetector CT imaging of the lumbar spine was performed without intravenous contrast administration. Multiplanar CT image reconstructions were also generated. RADIATION DOSE REDUCTION: This exam was performed according to the departmental dose-optimization program which includes automated exposure control, adjustment of the mA and/or kV according to patient size and/or use of iterative reconstruction technique. COMPARISON:  Lumbar spine x-ray 06/26/2021 FINDINGS: Segmentation: 5 lumbar type vertebrae. Alignment: Normal. Vertebrae: No acute fracture or focal pathologic process. Bones are osteopenic. Paraspinal and other soft tissues: Negative. Disc levels: There is mild disc space narrowing and endplate osteophyte formation L2-L3 and L3-L4. There are broad-based disc bulges at these levels causing mild central canal stenosis. There is also disc bulge at L4-L5 causing mild central canal stenosis. No significant neural foraminal stenosis identified  at any level. IMPRESSION: 1. No acute fracture or traumatic subluxation of the lumbar spine. 2. Mild degenerative changes of the lumbar spine. Electronically Signed   By: [name] M.D.   On: 05/08/2023 22:48  DG Ribs Unilateral W/Chest Right Result Date: 05/08/2023 CLINICAL DATA:  Fall, rib pain on the right EXAM: RIGHT RIBS AND CHEST - 3+ VIEW COMPARISON:  Radiograph and CT 12/13/2022 FINDINGS: Stable cardiomediastinal silhouette. Aortic atherosclerotic calcification. Prominent central pulmonary arteries. Pulmonary vascular congestion. No focal consolidation, pleural effusion, or pneumothorax. No displaced rib fractures. IMPRESSION: No displaced rib fractures. Electronically Signed   By: Minerva Fester M.D.   On: 05/08/2023 22:35   DG Knee 2 Views Left Result Date: 05/08/2023 CLINICAL DATA:  Fall EXAM: LEFT KNEE - 1-2 VIEW COMPARISON:  Left knee pain 02/28/2023 FINDINGS: The bones are osteopenic. No evidence of fracture, dislocation, or joint effusion. There is mild medial and lateral compartment joint space narrowing and osteophyte formation compatible with degenerative change. Soft tissues are unremarkable. IMPRESSION: 1. No acute fracture or dislocation. 2. Mild degenerative changes. Electronically Signed   By: [name] M.D.   On: 05/08/2023 22:33    Procedures Procedures    Medications Ordered in ED Medications  oxyCODONE (Oxy IR/ROXICODONE) immediate release tablet 10 mg (10 mg Oral Given 05/08/23 1551)    ED Course/ Medical Decision Making/ A&P                                 Medical Decision Making Risk Prescription drug management.   [name] 63 y.o. presented today for fall. Working DDx that I considered at this time includes, but not limited to, vasovagal episode, mechanical fall, ICH, epidural/subdural hematoma, basilar skull fracture, anemia, electrolyte abnormalities, drug-induced, arrhythmia, UTI, fracture, contusion, soft tissue injury.  R/o  DDx: vasovagal episode, ICH, epidural/subdural hematoma, basilar skull fracture, anemia, electrolyte abnormalities, drug-induced, arrhythmia, UTI, fracture: These are considered less likely due to history of present illness, physical exam, lab/imaging findings  Review of prior external notes: 12/13/2022 ED provider notes  Unique Tests and My Independent Interpretation:  CT Head without contrast: Unremarkable CT lumbar spine without contrast: Unremarkable Right rib x-ray: Unremarkable Left knee x-ray: Arthritis noted without fracture CBC: Chronic leukocytosis 16.5 CK: Unremarkable CMP: Anion gap 17 VBG: pending  Social Determinants of Health: none  Discussion with Independent Historian:  Daughter  Discussion of Management of Tests: None  Risk: Medium: prescription drug management  Risk Stratification Score: None  Staffed with Young, DO  Plan: On exam patient was no acute distress stable vitals.  Patient's exam was also unremarkable.  Labs and imaging from triage were significant for anion gap of 17.  Patient is on SGLT2 inhibitor Arciga and so we will get VBG to rule out euglycemic DKA.  If this is negative do feel the patient can be discharged with orthopedic follow-up.  Patient is also out of her Fioricet for her migraines and would like this refilled so this will be done as well.  Patient signed out to Newton, PA-C.  Please review their note for the continuation of patient's care.  The plan at this point is follow-up on labs and if patient is not in euglycemic DKA can discharge with outpatient follow-up.  This chart was dictated using voice recognition software.  Despite best efforts to proofread,  errors can occur which can change the documentation meaning.         Final Clinical Impression(s) / ED Diagnoses Final diagnoses:  None    Rx / DC Orders ED Discharge Orders          Ordered    butalbital-acetaminophen-caffeine (FIORICET) 50-325-40 MG  tablet  Every 6  hours PRN        05/08/23 2302              Remi Deter 05/08/23 2334    Coral Spikes, DO 05/14/23 [phone]

## 2023-05-08 NOTE — ED Provider Triage Note (Signed)
Emergency Medicine Provider Triage Evaluation Note  Glennice Marcos , a 63 y.o. female  was evaluated in triage.  Pt complains of rib pain, back pain after a fall. The patient states that she has degenerative arthritis in her knees bilaterally, "bone on bone." She states that her knee gave out on her in the bathroom yesterday and she fell. She has been having back pain. She also endorses weakness in her left leg and wonders whether she has had a stroke however symptoms have been present "for a while."  Review of Systems  Positive: Leg weakness, knee pain, back pain, rib pain Negative: SOB, abd pain  Physical Exam  BP 125/65   Pulse 78   Temp 97.6 F (36.4 C)   Resp 18   Wt 122.5 kg   SpO2 100%   BMI 44.93 kg/m  Gen:   Awake, no distress   Resp:  Normal effort MSK:   Moves extremities without difficulty TTP of the left knee, right sided rib TTP, low lumbar spine TTP   Medical Decision Making  Medically screening exam initiated at 3:39 PM.  Appropriate orders placed.  [name] was informed that the remainder of the evaluation will be completed by another provider, this initial triage assessment does not replace that evaluation, and the importance of remaining in the ED until their evaluation is complete.  Screening labs and imaging obtained, pt stable for the lobby.   Ernie Avena, MD 05/08/23 [phone]

## 2023-05-08 NOTE — ED Notes (Signed)
Pt took her buspirone for anxiety and butalbital for migraine, her at home meds. Pt is in pain and emotional

## 2023-05-08 NOTE — ED Triage Notes (Signed)
Presents from home via EMS for lower back pain after mechanical fall yesterday from legs giving out. Endorses bilateral knee pain,  low back pain.  No meds en route No thinners EMS VS: 144/90, 68, 16RR, 96% 2L baseline, 124CBG

## 2023-05-08 NOTE — Discharge Instructions (Signed)
Please follow-up with your primary care provider and orthopedist in regards recent ER visit.  Today your labs and imaging show that you have arthritis in your left knee and you need to be evaluated by orthopedics for further evaluation.  If symptoms should worsen please return to the ER.

## 2023-05-09 ENCOUNTER — Other Ambulatory Visit (HOSPITAL_COMMUNITY): Payer: Self-pay | Admitting: Cardiology

## 2023-05-09 DIAGNOSIS — E876 Hypokalemia: Secondary | ICD-10-CM

## 2023-05-09 LAB — I-STAT VENOUS BLOOD GAS, ED
Acid-Base Excess: 7 mmol/L — ABNORMAL HIGH (ref 0.0–2.0)
Bicarbonate: 34.1 mmol/L — ABNORMAL HIGH (ref 20.0–28.0)
Calcium, Ion: 1.13 mmol/L — ABNORMAL LOW (ref 1.15–1.40)
HCT: 39 % (ref 36.0–46.0)
Hemoglobin: 13.3 g/dL (ref 12.0–15.0)
O2 Saturation: 97 %
Potassium: 3.5 mmol/L (ref 3.5–5.1)
Sodium: 139 mmol/L (ref 135–145)
TCO2: 36 mmol/L — ABNORMAL HIGH (ref 22–32)
pCO2, Ven: 60.8 mm[Hg] — ABNORMAL HIGH (ref 44–60)
pH, Ven: 7.357 (ref 7.25–7.43)
pO2, Ven: 104 mm[Hg] — ABNORMAL HIGH (ref 32–45)

## 2023-05-13 ENCOUNTER — Emergency Department (HOSPITAL_COMMUNITY)
Admission: EM | Admit: 2023-05-13 | Discharge: 2023-05-14 | Disposition: A | Discharge status: Home / Self Care | Payer: Medicaid Other | Attending: Emergency Medicine | Admitting: Emergency Medicine

## 2023-05-13 ENCOUNTER — Emergency Department (HOSPITAL_COMMUNITY): Payer: Medicaid Other

## 2023-05-13 DIAGNOSIS — M7918 Myalgia, other site: Secondary | ICD-10-CM

## 2023-05-13 DIAGNOSIS — M25562 Pain in left knee: Secondary | ICD-10-CM | POA: Insufficient documentation

## 2023-05-13 DIAGNOSIS — Z7982 Long term (current) use of aspirin: Secondary | ICD-10-CM | POA: Insufficient documentation

## 2023-05-13 MED ORDER — ACETAMINOPHEN 500 MG PO TABS
1000.0000 mg | ORAL_TABLET | Freq: Once | ORAL | Status: AC
  Administered 2023-05-13: 1000 mg via ORAL
  Filled 2023-05-13: qty 2

## 2023-05-13 MED ORDER — MORPHINE SULFATE (PF) 2 MG/ML IV SOLN
4.0000 mg | Freq: Once | INTRAVENOUS | Status: AC
Start: 2023-05-13 — End: 2023-05-13
  Administered 2023-05-13: 4 mg via INTRAMUSCULAR
  Filled 2023-05-13: qty 2

## 2023-05-13 MED ORDER — PREDNISONE 20 MG PO TABS: 40.0000 mg | ORAL_TABLET | Freq: Every day | ORAL | 0 refills | Status: DC

## 2023-05-13 NOTE — Discharge Instructions (Addendum)
As discussed, your evaluation today has been largely reassuring.  But, it is important that you monitor your condition carefully, and do not hesitate to return to the ED if you develop new, or concerning changes in your condition.  For additional anti-inflammatory relief you are being prescribed steroids.  Please take these as directed.  Otherwise, please follow-up with your physician for appropriate ongoing care.

## 2023-05-13 NOTE — ED Triage Notes (Signed)
PT BIB EMS for complaints of left knee pain. Pt fell 6 days ago and was evaluated here. Pt has follow up with ortho on Feb 6 but states knee and back pain are no improving.

## 2023-05-13 NOTE — ED Provider Notes (Signed)
[place] EMERGENCY DEPARTMENT AT [facility] Provider Note   CSN: [id number] Arrival date & time: 05/13/23  1529     History  Chief Complaint  Patient presents with   Knee Pain    Michele Owens is a 63 y.o. female.  HPI Presents after fall that occurred last week, now with worsening pain in her left knee.  She notes that after an evaluation she was scheduled to follow-up with orthopedics in 3 days.  In spite of this, her pain has become worse, and she now presents for evaluation.  No additional falls, no additional trauma, pain is severe, without associated new fever, loss of sensation distally.    Home Medications Prior to Admission medications   Medication Sig Start Date End Date Taking? Authorizing Provider  predniSONE (DELTASONE) 20 MG tablet Take 2 tablets (40 mg total) by mouth daily with breakfast. For the next four days 05/13/23  Yes Gerhard Munch, MD  acetaminophen (TYLENOL) 500 MG tablet Take 1,000 mg by mouth every 6 (six) hours as needed for moderate pain (pain score 4-6) or headache.    [provider]  albuterol (VENTOLIN HFA) 108 (90 Base) MCG/ACT inhaler Inhale 1-2 puffs into the lungs every 6 (six) hours as needed for wheezing or shortness of breath. 11/24/22   Laurey Morale, MD  allopurinol (ZYLOPRIM) 100 MG tablet Take 100 mg by mouth daily. Patient not taking: Reported on 02/02/2023    [provider]  aspirin EC 81 MG tablet Take 81 mg by mouth daily. Swallow whole. Patient not taking: Reported on 02/07/2023    [provider]  blood glucose meter kit and supplies KIT Dispense based on patient and insurance preference. Use up to four times daily as directed. 12/25/20   Azucena Fallen, MD  budesonide (PULMICORT) 0.25 MG/2ML nebulizer solution Inhale 2 mLs (0.25 mg total) by nebulization 2 (two) times daily. Patient not taking: Reported on 02/07/2023 11/24/22   Laurey Morale, MD  busPIRone (BUSPAR) 5 MG tablet  Take 1 tablet (5 mg total) by mouth 3 (three) times daily. 02/07/23   Avanell Shackleton, NP-C  butalbital-acetaminophen-caffeine (FIORICET) [phone] MG tablet Take 1-2 tablets by mouth every 6 (six) hours as needed for headache. 05/08/23 05/07/24  [name], PA-C  calcitRIOL (ROCALTROL) 0.5 MCG capsule Take 1 capsule (0.5 mcg total) by mouth daily. Patient not taking: Reported on 02/02/2023 09/26/16   Elease Etienne, MD  carvedilol (COREG) 3.125 MG tablet Take 1 tablet (3.125 mg total) by mouth 2 (two) times daily with a meal. 02/02/23 03/04/23  Laurey Morale, MD  clonazePAM (KLONOPIN) 2 MG tablet Take 1 tablet (2 mg total) by mouth 3 (three) times daily. 11/24/22   Laurey Morale, MD  cyclobenzaprine (FLEXERIL) 10 MG tablet Take 10 mg by mouth 3 (three) times daily as needed for muscle spasms. Patient not taking: Reported on 02/02/2023 11/19/19   [provider]  EPINEPHrine 0.3 mg/0.3 mL IJ SOAJ injection Inject 0.3 mg into the muscle once as needed for anaphylaxis.    [provider]  FARXIGA 10 MG TABS tablet Take 1 tablet (10 mg total) by mouth daily before breakfast. 02/02/23   Laurey Morale, MD  fenofibrate (TRICOR) 145 MG tablet Take 1 tablet (145 mg total) by mouth daily. 02/02/23   Laurey Morale, MD  FEROSUL 325 (65 Fe) MG tablet Take 1 tablet (325 mg total) by mouth daily. 02/09/23   Avanell Shackleton, NP-C  haloperidol (HALDOL) 5 MG tablet Take 1 tablet (5 mg total) by mouth at bedtime. Patient not taking: Reported on 02/02/2023 12/16/22 01/15/23  Willeen Niece, MD  levocetirizine (XYZAL) 5 MG tablet Take 1 tablet (5 mg total) by mouth at bedtime. 11/24/22   Laurey Morale, MD  levothyroxine (SYNTHROID) 88 MCG tablet Take 1 tablet (88 mcg total) by mouth daily. 02/09/23   Henson, Vickie L, NP-C  lidocaine (LIDODERM) 5 % Place 1 patch onto the skin daily. Remove & Discard patch within 12 hours or as directed by MD Patient not taking: Reported on 02/02/2023  10/12/22   Blue, Soijett A, PA-C  loperamide (IMODIUM) 2 MG capsule Take 1 capsule (2 mg total) by mouth 4 (four) times daily as needed for diarrhea or loose stools. Patient not taking: Reported on 02/02/2023 11/24/22   Laurey Morale, MD  losartan (COZAAR) 25 MG tablet Take 1 tablet (25 mg total) by mouth daily. 02/02/23   Laurey Morale, MD  nitroGLYCERIN (NITROSTAT) 0.4 MG SL tablet Place 1 tablet (0.4 mg total) under the tongue every 5 (five) minutes as needed for chest pain. Patient not taking: Reported on 02/07/2023 06/19/18   Clydie Braun, MD  nystatin cream (MYCOSTATIN) Apply 1 Application topically 2 (two) times daily as needed for dry skin. Patient not taking: Reported on 02/02/2023 09/26/22   [provider]  omeprazole (PRILOSEC) 20 MG capsule Take 1 capsule (20 mg total) by mouth 2 (two) times daily as needed (acid reflux). 11/24/22   Laurey Morale, MD  ondansetron (ZOFRAN) 4 MG tablet Take 4 mg by mouth every 8 (eight) hours as needed for nausea or vomiting. Patient not taking: Reported on 02/02/2023 01/16/22   [provider]  OXYGEN Inhale 3 L into the lungs continuous.    [provider]  polyethylene glycol (MIRALAX / GLYCOLAX) 17 g packet Take 17 g by mouth daily as needed for mild constipation. Patient not taking: Reported on 02/02/2023 11/24/22   Laurey Morale, MD  potassium chloride (KLOR-CON) 10 MEQ tablet TAKE 1 TABLET (10 MEQ TOTAL) BY MOUTH 2 (TWO) TIMES DAILY. 05/10/23   Laurey Morale, MD  rosuvastatin (CRESTOR) 10 MG tablet Take 1 tablet (10 mg total) by mouth every evening. 02/02/23   Laurey Morale, MD  senna-docusate (SENOKOT-S) 8.6-50 MG tablet Take 1 tablet by mouth daily. 02/07/23   Henson, Vickie L, NP-C  spironolactone (ALDACTONE) 25 MG tablet Take 1 tablet (25 mg total) by mouth daily. 02/02/23   Laurey Morale, MD  SUBOXONE 4-1 MG FILM Place 0.5-1 Film under the tongue 3 (three) times daily as needed (Withdrawal). Patient  not taking: Reported on 02/07/2023 10/16/22   [provider]  torsemide (DEMADEX) 20 MG tablet Take 2 tablets (40 mg total) by mouth 2 (two) times daily. 11/24/22   Laurey Morale, MD  traZODone (DESYREL) 50 MG tablet Take 2 tablets (100 mg total) by mouth at bedtime. 02/07/23   Henson, Vickie L, NP-C  umeclidinium-vilanterol (ANORO ELLIPTA) 62.5-25 MCG/ACT AEPB Inhale 1 puff into the lungs daily. 02/09/23   Henson, Vickie L, NP-C      Allergies    Bee venom, Fioricet [butalbital-apap-caffeine], Ibuprofen, Lamisil [terbinafine], and Nsaids    Review of Systems   Review of Systems  Physical Exam Updated Vital Signs BP 107/72   Pulse 76   Temp 98.5 F (36.9 C)   Resp 18   SpO2 98%  Physical Exam Vitals and nursing note  reviewed.  Constitutional:      General: She is not in acute distress.    Appearance: She is well-developed. She is obese.  HENT:     Head: Normocephalic and atraumatic.  Eyes:     Conjunctiva/sclera: Conjunctivae normal.  Pulmonary:     Effort: Pulmonary effort is normal. No respiratory distress.     Breath sounds: No stridor.  Abdominal:     General: There is no distension.  Musculoskeletal:     Comments: Left knee held in flexion, Ace wrap in place.  Skin:    General: Skin is warm and dry.  Neurological:     Mental Status: She is alert and oriented to person, place, and time.     Cranial Nerves: No cranial nerve deficit.  Psychiatric:        Mood and Affect: Mood normal.     ED Results / Procedures / Treatments   Labs (all labs ordered are listed, but only abnormal results are displayed) Labs Reviewed - No data to display  EKG None  Radiology DG Knee Complete 4 Views Right Result Date: 05/13/2023 CLINICAL DATA:  Fall. EXAM: RIGHT KNEE - COMPLETE 4+ VIEW COMPARISON:  None Available. FINDINGS: No evidence of fracture, dislocation, or joint effusion. Mild narrowing of all 3 joint spaces is noted. Soft tissues are unremarkable. IMPRESSION:  Mild tricompartmental degenerative joint disease. No acute abnormality seen. Electronically Signed   By: Lupita Raider M.D.   On: 05/13/2023 18:27   DG Shoulder Right Result Date: 05/13/2023 CLINICAL DATA:  Right shoulder pain after fall. EXAM: RIGHT SHOULDER - 2+ VIEW COMPARISON:  June 26, 2021. FINDINGS: There is no evidence of fracture or dislocation. Moderate degenerative changes seen involving right acromioclavicular joint. Soft tissues are unremarkable. IMPRESSION: Moderate degenerative joint disease of right acromioclavicular joint. No acute abnormality seen. Electronically Signed   By: Lupita Raider M.D.   On: 05/13/2023 18:25   DG Knee Complete 4 Views Left Result Date: 05/13/2023 CLINICAL DATA:  Worsening pain after a fall. EXAM: LEFT KNEE - COMPLETE 4+ VIEW COMPARISON:  05/08/2023 and 01/19/2023. FINDINGS: There may be a tiny joint effusion. No acute osseous abnormality. Mild tri compartment osteophytosis. IMPRESSION: 1. Tiny joint effusion.  No fracture. 2. Tricompartment osteophytosis. Electronically Signed   By: Leanna Battles M.D.   On: 05/13/2023 18:24    Procedures Procedures    Medications Ordered in ED Medications  acetaminophen (TYLENOL) tablet 1,000 mg (1,000 mg Oral Given 05/13/23 1630)  morphine (PF) 2 MG/ML injection 4 mg (4 mg Intramuscular Given 05/13/23 1630)    ED Course/ Medical Decision Making/ A&P                                 Medical Decision Making Obese adult female with arthritic changes in her knee presents after a fall that occurred a few days ago, now with worsening pain.  Absence of additional falls, absence of additional complaints is somewhat reassuring suspicion for musculoskeletal pain, given consideration of occult fracture x-ray ordered, Tylenol ordered.   Amount and/or Complexity of Data Reviewed External Data Reviewed: notes.    Details: Note, x-ray from last week reviewed Radiology: ordered and independent interpretation performed.  Decision-making details documented in ED Course.  Risk OTC drugs. Prescription drug management. Decision regarding hospitalization. Diagnosis or treatment significantly limited by social determinants of health.  On repeat exam the patient is awake, alert, sitting she request  patient she has not an additional, but continues to have pain in both of these areas. 6:57 PM , Alert, sitting upright.  Have reviewed the patient's x-rays, no fracture in any of the joints studied.  She remains afebrile, pleasantly interactive.  X-rays consistent with arthritic changes in all of the above joints.  Patient has scheduled follow-up in 4 days at orthopedics, given reassuring findings here was discharged in stable condition to follow-up with Ortho.        Final Clinical Impression(s) / ED Diagnoses Final diagnoses:  Musculoskeletal pain    Rx / DC Orders ED Discharge Orders          Ordered    predniSONE (DELTASONE) 20 MG tablet  Daily with breakfast        05/13/23 1857              Gerhard Munch, MD 05/13/23 1858

## 2023-05-13 NOTE — ED Notes (Signed)
[facility] called for transport  to address on file.

## 2023-05-14 ENCOUNTER — Other Ambulatory Visit: Payer: Self-pay | Admitting: Family Medicine

## 2023-05-14 DIAGNOSIS — J449 Chronic obstructive pulmonary disease, unspecified: Secondary | ICD-10-CM

## 2023-05-17 ENCOUNTER — Ambulatory Visit: Payer: Medicaid Other | Admitting: Orthopaedic Surgery

## 2023-05-17 DIAGNOSIS — M545 Low back pain, unspecified: Secondary | ICD-10-CM | POA: Diagnosis not present

## 2023-05-17 DIAGNOSIS — M25562 Pain in left knee: Secondary | ICD-10-CM | POA: Diagnosis not present

## 2023-05-17 MED ORDER — PREDNISONE 10 MG (21) PO TBPK: ORAL_TABLET | ORAL | 3 refills | Status: DC

## 2023-05-17 NOTE — Progress Notes (Signed)
 Office Visit Note   Patient: Michele Owens           Date of Birth: [birth year]           MRN: [id number] Visit Date: 05/17/2023              Requested by: No referring provider defined for this encounter. PCP: Pcp, No   Assessment & Plan: Visit Diagnoses:  1. Acute pain of left knee   2. Acute left-sided low back pain without sciatica     Plan: [name] is a 63 year old medically complex female with low back pain and left knee pain.  No acute findings just degenerative on imaging.  Prednisone  refill.  She declined cortisone injection.  She will follow-up with pain clinic at [facility]  Follow-up as needed.  Follow-Up Instructions: No follow-ups on file.   Orders:  No orders of the defined types were placed in this encounter.  Meds ordered this encounter  Medications  . predniSONE  (STERAPRED UNI-PAK 21 TAB) 10 MG (21) TBPK tablet    Sig: Take as directed    Dispense:  21 tablet    Refill:  3      Procedures: No procedures performed   Clinical Data: No additional findings.   Subjective: Chief Complaint  Patient presents with  . Lower Back - Pain  . Left Knee - Pain    HPI Patient comes in today for left knee and low back pain status post mechanical fall about a week and a half ago.  Pain does not radiate.  Pain gets worse with activity and better with rest.  She is on chronic oxygen .  Uses a rollator at baseline. Review of Systems  Constitutional: Negative.   HENT: Negative.    Eyes: Negative.   Respiratory: Negative.    Cardiovascular: Negative.   Endocrine: Negative.   Musculoskeletal: Negative.   Neurological: Negative.   Hematological: Negative.   Psychiatric/Behavioral: Negative.    All other systems reviewed and are negative.    Objective: Vital Signs: There were no vitals taken for this visit.  Physical Exam Vitals and nursing note reviewed.  Constitutional:      Appearance: She is well-developed.  HENT:     Head: Atraumatic.     Nose:  Nose normal.  Eyes:     Extraocular Movements: Extraocular movements intact.  Cardiovascular:     Pulses: Normal pulses.  Pulmonary:     Effort: Pulmonary effort is normal.  Abdominal:     Palpations: Abdomen is soft.  Musculoskeletal:     Cervical back: Neck supple.  Skin:    General: Skin is warm.     Capillary Refill: Capillary refill takes less than 2 seconds.  Neurological:     Mental Status: She is alert. Mental status is at baseline.  Psychiatric:        Behavior: Behavior normal.        Thought Content: Thought content normal.        Judgment: Judgment normal.    Ortho Exam Examination of the lumbar spine and left knee are nonfocal.  No joint effusion of the left knee.  No motor or sensory deficits. Specialty Comments:  No specialty comments available.  Imaging: No results found.   PMFS History: Patient Active Problem List   Diagnosis Date Noted  . At risk for polypharmacy 02/07/2023  . Oxygen  dependent 02/07/2023  . Acute respiratory failure with hypercapnia (HCC) 12/14/2022  . Delirium due to multiple etiologies, acute, hyperactive 12/14/2022  .  Acute respiratory failure with hypoxia and hypercapnia (HCC) 10/25/2022  . Acute on chronic diastolic CHF (congestive heart failure) (HCC) 10/23/2022  . Current mild episode of major depressive disorder without prior episode (HCC) 06/06/2022  . Fever 06/04/2022  . Acute on chronic diastolic (congestive) heart failure (HCC) 05/16/2022  . Acute renal failure (ARF) (HCC) 02/01/2022  . Hypothyroidism 02/01/2022  . Chronic pain disorder 02/01/2022  . Respiratory failure with hypercapnia (HCC) 12/23/2020  . Anemia 12/23/2020  . Diabetic peripheral neuropathy (HCC) 08/18/2020  . Acute on chronic respiratory failure with hypoxia (HCC) 04/18/2020  . Hemoptysis 04/18/2020  . Acute encephalopathy 04/18/2020  . Chronic diastolic CHF (congestive heart failure) (HCC) 04/18/2020  . Sepsis (HCC) 04/18/2020  . Severe obesity  (BMI >= 40) (HCC)   . Fall at home, initial encounter 06/23/2018  . HNP (herniated nucleus pulposus), thoracic 06/23/2018  . CHF (congestive heart failure) (HCC) 06/18/2018  . Essential hypertension 08/28/2017  . Obesity 08/28/2017  . COPD exacerbation (HCC) 08/04/2017  . Acute and chronic respiratory failure with hypercapnia (HCC)   . Pulmonary HTN (HCC)   . Genetic testing 02/27/2017  . Family history of thyroid  cancer   . Hypomagnesemia 10/03/2016  . Atypical chest pain   . HLD (hyperlipidemia) 09/23/2016  . COPD (chronic obstructive pulmonary disease) (HCC) 09/23/2016  . Gout 09/23/2016  . DVT (deep vein thrombosis) in pregnancy 09/23/2016  . Thyroid  cancer (HCC) 09/23/2016  . Hypocalcemia 09/23/2016  . AKI (acute kidney injury) (HCC) 09/23/2016  . Acute pulmonary edema (HCC)   . Acute hypoxemic respiratory failure (HCC)   . Chronic respiratory failure with hypoxia and hypercapnia (HCC) 07/16/2016  . CAP (community acquired pneumonia) 01/23/2016  . 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 Pulmonary hypertension (HCC) 01/23/2016  . Obesity hypoventilation syndrome (HCC) 08/26/2015  . Dyslipidemia associated with type 2 diabetes mellitus (HCC) 04/24/2015  . Controlled type 2 diabetes mellitus without complication, without long-term current use of insulin  (HCC) 04/24/2015  . Depression with anxiety 04/24/2015  . Benign essential HTN 04/24/2015  . Normocytic anemia 04/24/2015  . Anxiety 04/24/2015  . OSA (obstructive sleep apnea) 05/10/2012  . Tobacco abuse 07/04/2011   Past Medical History:  Diagnosis Date  . Anxiety   . Arthritis   . Asthma   . Chronic diastolic CHF (congestive heart failure) (HCC)   . COPD (chronic obstructive pulmonary disease) (HCC)    Uses Oxygen  at night  . Depression   . Diabetes mellitus without complication (HCC)   . GERD (gastroesophageal reflux disease)   . Gout   . Headache    migraines  . History of DVT of  lower extremity   . Hypertension   . Morbid obesity with BMI of 45.0-49.9, adult (HCC)   . Thyroid  cancer (HCC) 09/23/2016    Family History  Problem Relation Age of Onset  . Cancer Father        thought to be due to exposure to concrete  . Diabetes Mother   . Thyroid  cancer Mother        dx in her 43s-60s  . Cancer Maternal Uncle        2 uncles with cancer NOS  . Brain cancer Paternal Aunt   . Cancer Cousin        maternal first cousin - NOS  . Cancer Cousin        maternal first cousin - NOS    Past Surgical History:  Procedure Laterality Date  . BUNIONECTOMY Bilateral   .  CARDIAC CATHETERIZATION N/A 06/23/2015   Procedure: Right/Left Heart Cath and Coronary Angiography;  Surgeon: Ezra GORMAN Shuck, MD;  Location: [facility] INVASIVE CV LAB;  Service: Cardiovascular;  Laterality: N/A;  . COLONOSCOPY WITH PROPOFOL  N/A 12/15/2015   Procedure: COLONOSCOPY WITH PROPOFOL ;  Surgeon: Norleen Hint, MD;  Location[facility] ENDOSCOPY;  Service: Endoscopy;  Laterality: N/A;  . RIGHT HEART CATH N/A 10/01/2019   Procedure: RIGHT HEART CATH;  Surgeon: Shuck Ezra GORMAN, MD;  Location: [facility] INVASIVE CV LAB;  Service: Cardiovascular;  Laterality: N/A;  . THYROIDECTOMY  09/19/2016  . THYROIDECTOMY N/A 09/19/2016   Procedure: TOTAL THYROIDECTOMY;  Surgeon: Eletha Boas, MD;  Location: [facility] OR;  Service: General;  Laterality: N/A;  . TONSILLECTOMY    . TOTAL ABDOMINAL HYSTERECTOMY  07/14/10   Social History   Occupational History  . Occupation: disabled  Tobacco Use  . Smoking status: Former    Current packs/day: 0.00    Average packs/day: 0.5 packs/day for 41.0 years (20.5 ttl pk-yrs)    Types: Cigarettes    Start date: 07/1975    Quit date: 07/2016    Years since quitting: 6.8  . Smokeless tobacco: Never  Vaping Use  . Vaping status: Never Used  Substance and Sexual Activity  . Alcohol use: Not Currently    Comment: quit 12 years ago  . Drug use: Not Currently    Types: Marijuana, Cocaine    Comment: quit  12 years ago  . Sexual activity: Yes

## 2023-05-18 ENCOUNTER — Emergency Department (HOSPITAL_COMMUNITY)
Admission: EM | Admit: 2023-05-18 | Discharge: 2023-05-19 | Disposition: A | Discharge status: Home / Self Care | Payer: Medicaid Other | Attending: Emergency Medicine | Admitting: Emergency Medicine

## 2023-05-18 ENCOUNTER — Emergency Department (HOSPITAL_COMMUNITY): Payer: Medicaid Other

## 2023-05-18 ENCOUNTER — Encounter (HOSPITAL_COMMUNITY): Payer: Self-pay

## 2023-05-18 ENCOUNTER — Other Ambulatory Visit: Payer: Self-pay

## 2023-05-18 DIAGNOSIS — Z20822 Contact with and (suspected) exposure to covid-19: Secondary | ICD-10-CM | POA: Insufficient documentation

## 2023-05-18 DIAGNOSIS — R002 Palpitations: Secondary | ICD-10-CM | POA: Diagnosis present

## 2023-05-18 DIAGNOSIS — I471 Supraventricular tachycardia, unspecified: Secondary | ICD-10-CM | POA: Insufficient documentation

## 2023-05-18 LAB — CBC
HCT: 40.3 % (ref 36.0–46.0)
Hemoglobin: 12.8 g/dL (ref 12.0–15.0)
MCH: 30.3 pg (ref 26.0–34.0)
MCHC: 31.8 g/dL (ref 30.0–36.0)
MCV: 95.5 fL (ref 80.0–100.0)
Platelets: 360 10*3/uL (ref 150–400)
RBC: 4.22 MIL/uL (ref 3.87–5.11)
RDW: 12.8 % (ref 11.5–15.5)
WBC: 20.2 10*3/uL — ABNORMAL HIGH (ref 4.0–10.5)
nRBC: 0 % (ref 0.0–0.2)

## 2023-05-18 LAB — MAGNESIUM: Magnesium: 1.2 mg/dL — ABNORMAL LOW (ref 1.7–2.4)

## 2023-05-18 LAB — COMPREHENSIVE METABOLIC PANEL
ALT: 10 U/L (ref 0–44)
AST: 14 U/L — ABNORMAL LOW (ref 15–41)
Albumin: 2 g/dL — ABNORMAL LOW (ref 3.5–5.0)
Alkaline Phosphatase: 34 U/L — ABNORMAL LOW (ref 38–126)
Anion gap: 6 (ref 5–15)
BUN: 13 mg/dL (ref 8–23)
CO2: 18 mmol/L — ABNORMAL LOW (ref 22–32)
Calcium: 4.6 mg/dL — CL (ref 8.9–10.3)
Chloride: 122 mmol/L — ABNORMAL HIGH (ref 98–111)
Creatinine, Ser: 0.34 mg/dL — ABNORMAL LOW (ref 0.44–1.00)
GFR, Estimated: >60 mL/min (ref >60)
Glucose, Bld: 60 mg/dL — ABNORMAL LOW (ref 70–99)
Potassium: 2.5 mmol/L — CL (ref 3.5–5.1)
Sodium: 146 mmol/L — ABNORMAL HIGH (ref 135–145)
Total Bilirubin: 0.2 mg/dL (ref 0.0–1.2)
Total Protein: 3.5 g/dL — ABNORMAL LOW (ref 6.5–8.1)

## 2023-05-18 MED ORDER — MAGNESIUM SULFATE 2 GM/50ML IV SOLN
2.0000 g | Freq: Once | INTRAVENOUS | Status: AC
  Administered 2023-05-19: 2 g via INTRAVENOUS
  Filled 2023-05-18: qty 50

## 2023-05-18 MED ORDER — POTASSIUM CHLORIDE 10 MEQ/100ML IV SOLN: 10.0000 meq | INTRAVENOUS | Status: DC

## 2023-05-18 MED ORDER — POTASSIUM CHLORIDE CRYS ER 20 MEQ PO TBCR: 40.0000 meq | EXTENDED_RELEASE_TABLET | Freq: Once | ORAL | Status: DC

## 2023-05-18 NOTE — ED Provider Notes (Addendum)
 [facility]-EMERGENCY DEPT [facility] Emergency Department Provider Note MRN:  [id number]  Arrival date & time: 05/19/23     Chief Complaint   Irregular Heart Beat   History of Present Illness   Michele Owens is a 63 y.o. year-old female presents to the ED with chief complaint of palpitations. Felt dizzy.  Called EMS and was found to be in SVT.  Was given 6mg  Adenosine by EMS and converted to NSR.  She denies any caffeine  or stimulant use.  She states that she is feeling better now.  Denies recent illness.  History provided by patient.   Review of Systems  Pertinent positive and negative review of systems noted in HPI.    Physical Exam   Vitals:   05/18/23 2158 05/19/23 0200  BP:  (!) 120/46  Pulse: 88 79  Resp: 17 18  Temp: 98.1 F (36.7 C)   SpO2: 100% 100%    CONSTITUTIONAL:  non toxic-appearing, NAD NEURO:  Alert and oriented x 3, CN 3-12 grossly intact EYES:  eyes equal and reactive ENT/NECK:  Supple, no stridor  CARDIO:  normal rate, regular rhythm, appears well-perfused  PULM:  No respiratory distress,  GI/GU:  non-distended, no abdominal tenderness MSK/SPINE:  No gross deformities, no edema, moves all extremities  SKIN:  no rash, atraumatic   *Additional and/or pertinent findings included in MDM below  Diagnostic and Interventional Summary    EKG Interpretation Date/Time:    Ventricular Rate:    PR Interval:    QRS Duration:    QT Interval:    QTC Calculation:   R Axis:      Text Interpretation:         Labs Reviewed  CBC - Abnormal; Notable for the following components:      Result Value   WBC 20.2 (*)    All other components within normal limits  COMPREHENSIVE METABOLIC PANEL - Abnormal; Notable for the following components:   Sodium 146 (*)    Potassium 2.5 (*)    Chloride 122 (*)    CO2 18 (*)    Glucose, Bld 60 (*)    Creatinine, Ser 0.34 (*)    Calcium  4.6 (*)    Total Protein 3.5 (*)    Albumin  2.0 (*)    AST 14 (*)     Alkaline Phosphatase 34 (*)    All other components within normal limits  MAGNESIUM  - Abnormal; Notable for the following components:   Magnesium  1.2 (*)    All other components within normal limits  BASIC METABOLIC PANEL - Abnormal; Notable for the following components:   Chloride 95 (*)    Glucose, Bld 104 (*)    All other components within normal limits  RESP PANEL BY RT-PCR (RSV, FLU A&B, COVID)  [id number]  URINALYSIS, ROUTINE W REFLEX MICROSCOPIC    DG Chest Port 1 View  Final Result      Medications  magnesium  sulfate IVPB 2 g 50 mL (0 g Intravenous Stopped 05/19/23 0140)     Procedures  /  Critical Care Procedures  ED Course and Medical Decision Making  I have reviewed the triage vital signs, the nursing notes, and pertinent available records from the EMR.  Social Determinants Affecting Complexity of Care: Patient has no clinically significant social determinants affecting this chief complaint..   ED Course: Clinical Course as of 05/19/23 0242  Fri May 18, 2023  2241 K and Ca initially reported as criticals.  Upon closer look of the labs,  I am concerned about dilution with NS.  Will recheck. [RB]  Sat May 19, 2023  0026 K and Ca are normal on recheck. [RB]  [id number] Patient has remained in NSR here.  Feel that she is stable for discharge home.  Will have patient follow-up with cardiology. [RB]    Clinical Course User Index [RB] Vicky Charleston, PA-C    Medical Decision Making Patient here after episode of palpitations that was noted to be SVT by EMS.  Was given 6 mg Adenosine and converted back to NSR.  She states that she is feeling well now.   Will check electrolytes, repeat EKG, and monitor.  If patient remains in NSR, I suspect that she will be able to be discharged home.  Amount and/or Complexity of Data Reviewed Labs: ordered.    Details: NSR Radiology: ordered. ECG/medicine tests: ordered and independent interpretation performed.  Risk Prescription drug  management.         Consultants: No consultations were needed in caring for this patient.   Treatment and Plan: I considered admission due to patient's initial presentation, but after considering the examination and diagnostic results, patient will not require admission and can be discharged with outpatient follow-up.    Final Clinical Impressions(s) / ED Diagnoses     ICD-10-CM   1. SVT (supraventricular tachycardia) (HCC)  I47.10 Ambulatory referral to Cardiology      ED Discharge Orders          Ordered    Ambulatory referral to Cardiology       Comments: If you have not heard from the Cardiology office within the next 72 hours please call [phone].   05/19/23 0240              Discharge Instructions Discussed with and Provided to Patient:   Discharge Instructions   None      Vicky Charleston, PA-C 05/19/23 0243    Vicky Charleston, PA-C 05/19/23 0249    Tegeler, Lonni PARAS, MD 05/19/23 [phone]

## 2023-05-18 NOTE — ED Triage Notes (Signed)
 Pt to ED by EMS from home with c/o palpitations. pT WAS FOUND TO BE IN svt by EMS. Received 6mg  adenosine by EMS and converted to NSR. Opt arrives in NSR. Pt states she does have some CP but believes that is due to the palpitations. All other VSS, NADN.

## 2023-05-19 LAB — RESP PANEL BY RT-PCR (RSV, FLU A&B, COVID)  RVPGX2
Influenza A by PCR: NEGATIVE
Influenza B by PCR: NEGATIVE
Resp Syncytial Virus by PCR: NEGATIVE
SARS Coronavirus 2 by RT PCR: NEGATIVE

## 2023-05-20 LAB — BASIC METABOLIC PANEL
Anion gap: 13 (ref 5–15)
BUN: 20 mg/dL (ref 8–23)
CO2: 32 mmol/L (ref 22–32)
Calcium: 9 mg/dL (ref 8.9–10.3)
Chloride: 95 mmol/L — ABNORMAL LOW (ref 98–111)
Creatinine, Ser: 0.98 mg/dL (ref 0.44–1.00)
GFR, Estimated: >60 mL/min (ref >60)
Glucose, Bld: 104 mg/dL — ABNORMAL HIGH (ref 70–99)
Potassium: 3.8 mmol/L (ref 3.5–5.1)
Sodium: 140 mmol/L (ref 135–145)

## 2023-05-21 ENCOUNTER — Telehealth (HOSPITAL_COMMUNITY): Payer: Self-pay | Admitting: Cardiology

## 2023-05-21 ENCOUNTER — Other Ambulatory Visit: Payer: Self-pay | Admitting: Family Medicine

## 2023-05-21 ENCOUNTER — Telehealth: Payer: Self-pay | Admitting: *Deleted

## 2023-05-21 DIAGNOSIS — J449 Chronic obstructive pulmonary disease, unspecified: Secondary | ICD-10-CM

## 2023-05-21 NOTE — Telephone Encounter (Signed)
 Pt left Vm  Patient left VM saying she needs to be seen by her heart doctor ASAP. Says her heart heart was racing and they stopped her heart in the ambulance Friday. Also says her potassium was very low at the time she arrived to [facility] ED on Friday. Based on ED notes, it appears she was in SVT and given adenosine in the ambulance before converting to NSR. K was 2.5 on arrival. She says her heart is still racing and wanted to be seen as quickly as possible. Home phone is [phone]     -add on 2/12 @ 130p

## 2023-05-21 NOTE — Telephone Encounter (Signed)
 Pt called regarding orthopedic MD she saw in ED so that she can schedule a follow-up appointment.  RNCM reviewed chart and provided Dyana Glade, [phone]

## 2023-05-23 ENCOUNTER — Inpatient Hospital Stay (HOSPITAL_COMMUNITY)
Admission: RE | Admit: 2023-05-23 | Discharge: 2023-05-23 | Disposition: A | Discharge status: Home / Self Care | Payer: Medicaid Other | Source: Ambulatory Visit | Attending: Cardiology | Admitting: Cardiology

## 2023-05-23 ENCOUNTER — Encounter (HOSPITAL_COMMUNITY): Payer: Self-pay | Admitting: Cardiology

## 2023-05-23 ENCOUNTER — Encounter (HOSPITAL_COMMUNITY): Payer: Self-pay

## 2023-05-23 ENCOUNTER — Other Ambulatory Visit (HOSPITAL_COMMUNITY): Payer: Self-pay | Admitting: Cardiology

## 2023-05-23 ENCOUNTER — Ambulatory Visit (HOSPITAL_COMMUNITY)
Admission: RE | Admit: 2023-05-23 | Discharge: 2023-05-23 | Disposition: A | Discharge status: Home / Self Care | Payer: Medicaid Other | Source: Ambulatory Visit | Attending: Family Medicine | Admitting: Family Medicine

## 2023-05-23 ENCOUNTER — Ambulatory Visit (HOSPITAL_COMMUNITY): Admission: RE | Admit: 2023-05-23 | Payer: Medicaid Other | Source: Ambulatory Visit

## 2023-05-23 VITALS — BP 142/72 | HR 78 | Wt 317.2 lb

## 2023-05-23 DIAGNOSIS — Z5982 Transportation insecurity: Secondary | ICD-10-CM | POA: Insufficient documentation

## 2023-05-23 DIAGNOSIS — I471 Supraventricular tachycardia, unspecified: Secondary | ICD-10-CM | POA: Diagnosis present

## 2023-05-23 DIAGNOSIS — M109 Gout, unspecified: Secondary | ICD-10-CM | POA: Insufficient documentation

## 2023-05-23 DIAGNOSIS — Z6841 Body Mass Index (BMI) 40.0 and over, adult: Secondary | ICD-10-CM | POA: Insufficient documentation

## 2023-05-23 DIAGNOSIS — I10 Essential (primary) hypertension: Secondary | ICD-10-CM

## 2023-05-23 DIAGNOSIS — Z9981 Dependence on supplemental oxygen: Secondary | ICD-10-CM | POA: Insufficient documentation

## 2023-05-23 DIAGNOSIS — I5032 Chronic diastolic (congestive) heart failure: Secondary | ICD-10-CM | POA: Diagnosis not present

## 2023-05-23 DIAGNOSIS — J9611 Chronic respiratory failure with hypoxia: Secondary | ICD-10-CM | POA: Insufficient documentation

## 2023-05-23 DIAGNOSIS — E785 Hyperlipidemia, unspecified: Secondary | ICD-10-CM | POA: Insufficient documentation

## 2023-05-23 DIAGNOSIS — E669 Obesity, unspecified: Secondary | ICD-10-CM | POA: Diagnosis not present

## 2023-05-23 DIAGNOSIS — Z79899 Other long term (current) drug therapy: Secondary | ICD-10-CM | POA: Diagnosis not present

## 2023-05-23 DIAGNOSIS — I110 Hypertensive heart disease with heart failure: Secondary | ICD-10-CM | POA: Diagnosis present

## 2023-05-23 DIAGNOSIS — G4733 Obstructive sleep apnea (adult) (pediatric): Secondary | ICD-10-CM | POA: Insufficient documentation

## 2023-05-23 DIAGNOSIS — J449 Chronic obstructive pulmonary disease, unspecified: Secondary | ICD-10-CM | POA: Insufficient documentation

## 2023-05-23 DIAGNOSIS — F319 Bipolar disorder, unspecified: Secondary | ICD-10-CM | POA: Insufficient documentation

## 2023-05-23 DIAGNOSIS — Z139 Encounter for screening, unspecified: Secondary | ICD-10-CM

## 2023-05-23 DIAGNOSIS — I509 Heart failure, unspecified: Secondary | ICD-10-CM

## 2023-05-23 LAB — COMPREHENSIVE METABOLIC PANEL
ALT: 18 U/L (ref 0–44)
AST: 16 U/L (ref 15–41)
Albumin: 4 g/dL (ref 3.5–5.0)
Alkaline Phosphatase: 58 U/L (ref 38–126)
Anion gap: 13 (ref 5–15)
BUN: 33 mg/dL — ABNORMAL HIGH (ref 8–23)
CO2: 27 mmol/L (ref 22–32)
Calcium: 8.7 mg/dL — ABNORMAL LOW (ref 8.9–10.3)
Chloride: 98 mmol/L (ref 98–111)
Creatinine, Ser: 1.11 mg/dL — ABNORMAL HIGH (ref 0.44–1.00)
GFR, Estimated: 56 mL/min — ABNORMAL LOW (ref >60)
Glucose, Bld: 120 mg/dL — ABNORMAL HIGH (ref 70–99)
Potassium: 4 mmol/L (ref 3.5–5.1)
Sodium: 138 mmol/L (ref 135–145)
Total Bilirubin: 0.6 mg/dL (ref 0.0–1.2)
Total Protein: 7.4 g/dL (ref 6.5–8.1)

## 2023-05-23 LAB — CBC
HCT: 37.2 % (ref 36.0–46.0)
Hemoglobin: 11.9 g/dL — ABNORMAL LOW (ref 12.0–15.0)
MCH: 30.4 pg (ref 26.0–34.0)
MCHC: 32 g/dL (ref 30.0–36.0)
MCV: 95.1 fL (ref 80.0–100.0)
Platelets: 338 10*3/uL (ref 150–400)
RBC: 3.91 MIL/uL (ref 3.87–5.11)
RDW: 13.2 % (ref 11.5–15.5)
WBC: 23.1 10*3/uL — ABNORMAL HIGH (ref 4.0–10.5)
nRBC: 0 % (ref 0.0–0.2)

## 2023-05-23 LAB — BRAIN NATRIURETIC PEPTIDE: B Natriuretic Peptide: 15.3 pg/mL (ref 0.0–100.0)

## 2023-05-23 LAB — MAGNESIUM: Magnesium: 2.6 mg/dL — ABNORMAL HIGH (ref 1.7–2.4)

## 2023-05-23 LAB — TSH: TSH: 27.598 u[IU]/mL — ABNORMAL HIGH (ref 0.350–4.500)

## 2023-05-23 MED ORDER — NITROGLYCERIN 0.4 MG SL SUBL: 0.4000 mg | SUBLINGUAL_TABLET | SUBLINGUAL | 6 refills | Status: DC | PRN

## 2023-05-23 MED ORDER — CARVEDILOL 6.25 MG PO TABS: 6.2500 mg | ORAL_TABLET | Freq: Two times a day (BID) | ORAL | 11 refills | Status: DC

## 2023-05-23 NOTE — Addendum Note (Signed)
Encounter addended by: Faythe Casa, CMA on: 05/23/2023 4:35 PM  Actions taken: Visit diagnoses modified, Order list changed, Diagnosis association updated, Flowsheet accepted, Clinical Note Signed, Charge Capture section accepted

## 2023-05-23 NOTE — Progress Notes (Signed)
H&V Care Navigation CSW Progress Note  Clinical Social Worker informed pt in clinic with no way to get home.  Was brought by insurance transportation but they refuse to take patient home due to patient behaviors.  CSW consulted to arrange taxi for ride home- taxi called.  Patient is participating in a Managed Medicaid Plan:  Yes  SDOH Screenings   Food Insecurity: Food Insecurity Present (12/14/2022)  Housing: Medium Risk (12/14/2022)  Transportation Needs: Unmet Transportation Needs (12/14/2022)  Utilities: Not At Risk (12/14/2022)  Alcohol Screen: Low Risk  (10/24/2022)  Depression (PHQ2-9): High Risk (02/07/2023)  Financial Resource Strain: Low Risk  (10/24/2022)  Physical Activity: Inactive (02/01/2021)  Social Connections: Unknown (08/21/2021)   Received from [facility]  Stress: Stress Concern Present (02/23/2021)  Tobacco Use: Medium Risk (05/23/2023)    Burna Sis, LCSW Clinical Social Worker [facility] Desk#: [phone] Cell#: [phone]

## 2023-05-23 NOTE — Addendum Note (Signed)
Encounter addended by: Burna Sis, LCSW on: 05/23/2023 4:15 PM  Actions taken: Flowsheet accepted, Clinical Note Signed

## 2023-05-23 NOTE — Progress Notes (Signed)
ReDS Vest / Clip - 05/23/23 1600       ReDS Vest / Clip   Station Marker B    Ruler Value 46.5    ReDS Value Range Low volume    ReDS Actual Value 32

## 2023-05-23 NOTE — Progress Notes (Signed)
Primary Care: Pcp, No HF Cardiology: Dr Shirlee Latch  CC: SVT follow up  Michele Owens is a 63 y.o. with a history of  HTN, obesity, OSA, COPD, hyperlipidemia, diastolic CHF, gout, bipolar, and SVT.   Admitted in 8/16 with diastolic CHF and COPD exacerbation.  She was admitted in 1/17 with COPD exacerbation.  She was set up for a Cardiolite in 2/17.  This showed a small, moderate intensity partially reversible inferolateral defect.  LHC/RHC in 3/17 showed no significant CAD, mildly elevated PCWP.  PFTs in 3/17 showed severe COPD.     Admitted 10/15-10/19/17 with AECOPD and A/C diastolic CHF.  Diuresed with IV lasix. Completed course for CAP. Pt also noted have multinodular goiter on [place].  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    Echo in 4/18 showed EF 45-50%, moderate dilated RV with moderately decreased systolic function.    She was admitted again in 4/18 with COPD exacerbation, acute on chronic diastolic CHF, and CAP.     She was admitted in 3/20 with acute on chronic diastolic CHF and treated with IV Lasix.  Echo showed EF 60-65%, mild RV enlargement with normal function, respirophasic septal variation.      Echo in 5/21 showed LVEF 55-60%, RV normal, RVSP was normal.  RHC in 6/21 showed mean RA 11, PA 44/14 mean 27, mean PCWP 15, CI 4.6, PVR 1.1.   Echo in 2/24 showed EF 55-60%, mild LVH, mildly D-shaped septum.    Admitted 9/24 with somnolence/hypoxemia in the setting of hypercapnia likely from polypharmacy. She was put on Bipap and improved.   Felt dizzy 05/18/23, called EMS and found to be in SVT. Given adenosine and converted to NSR. K was 2.5, but was felt to be dilutional. Labs on re-check showed K 3.8.  Today she returns for post ED visit follow up. Overall feeling fine. Breathing is "ok." Lives by herself. Limited by generalized arthritis, uses a walker and WC. Legs swell occasionally. Denies palpitations, abnormal bleeding, CP, dizziness,  or  PND/Orthopnea. Appetite ok. No fever or chills. Does not weigh at home. Taking all medications. She wears 2 L oxygen at home. Asking for refills of meds and how to get "life alert."  ReDs reading: 32%, normal  ECG (personally reviewed): NSR 71 bpm  Review of Systems: All systems reviewed and negative except as per HPI.   PMH: 1. Back and hip pain 2. HTN 3. Obesity 4. GERD 5. Hyperlipidemia 6. Depression 7. COPD: Has quit smoking. Now on home oxygen 2L [place].  PFTs (3/17) with severe COPD.  8. 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 Event monitor (12/14) with only NSR noted.  10. Chest pain: Atypical.  ETT-Cardiolite (12/13) with EF 60%, no ischemia/infarction. Lexiscan Cardiolite (2/17) with small, moderate-intensity partially reversible inferolateral defect. LHC (3/17): No significant CAD. 11. Syncope: Uncertain etiology.   12. OHS/OSA: Cannot tolerate CPAP. Sleep study negative for OSA in 5/17.  13. Gout 14. Migraines 15. Diabetes 16. Diastolic CHF: Echo (8/16) with EF 55-60%, grade II diastolic dysfunction.  - RHC (6/29) with mean RA 8, PA 45/20 mean 30, mean PCWP 20; PVR 1.5 WU.  - Echo (4/18): EF 45-50%, diffuse hypokinesis, grade II diastolic dysfunction, moderately dilated RV with moderately decreased systolic function. - Echo (4/20): EF 60-65%, mild RV enlargement with normal systolic function. Respirophasic septal variation noted.   - RHC (6/21): mean RA 11, PA 44/14 mean 27, mean PCWP 15, CI  4.6, PVR 1.1.  - Echo (2/24): EF 55-60%, mild LVH, mildly D-shaped septum.   17. Papillary thyroid carcinoma: Noted on biopsy 3/18.  18. Hypothyroidism  Current Outpatient Medications  Medication Sig Dispense Refill   acetaminophen (TYLENOL) 500 MG tablet Take 1,000 mg by mouth every 6 (six) hours as needed for moderate pain (pain score 4-6) or headache.     albuterol (VENTOLIN HFA) 108 (90 Base) MCG/ACT inhaler Inhale 1-2 puffs into the lungs  every 6 (six) hours as needed for wheezing or shortness of breath. 1 each 0   allopurinol (ZYLOPRIM) 100 MG tablet Take 100 mg by mouth daily.     aspirin EC 81 MG tablet Take 81 mg by mouth daily. Swallow whole.     blood glucose meter kit and supplies KIT Dispense based on patient and insurance preference. Use up to four times daily as directed. 1 each 0   budesonide (PULMICORT) 0.25 MG/2ML nebulizer solution Inhale 2 mLs (0.25 mg total) by nebulization 2 (two) times daily. 120 mL 0   busPIRone (BUSPAR) 5 MG tablet Take 1 tablet (5 mg total) by mouth 3 (three) times daily. 90 tablet 0   butalbital-acetaminophen-caffeine (FIORICET) 50-325-40 MG tablet Take 1-2 tablets by mouth every 6 (six) hours as needed for headache. 20 tablet 0   calcitRIOL (ROCALTROL) 0.5 MCG capsule Take 1 capsule (0.5 mcg total) by mouth daily. 30 capsule 0   carvedilol (COREG) 3.125 MG tablet Take 1 tablet (3.125 mg total) by mouth 2 (two) times daily with a meal. 60 tablet 11   clonazePAM (KLONOPIN) 2 MG tablet Take 1 tablet (2 mg total) by mouth 3 (three) times daily. 90 tablet 0   cyclobenzaprine (FLEXERIL) 10 MG tablet Take 10 mg by mouth 3 (three) times daily as needed for muscle spasms.     EPINEPHrine 0.3 mg/0.3 mL IJ SOAJ injection Inject 0.3 mg into the muscle once as needed for anaphylaxis.     FARXIGA 10 MG TABS tablet Take 1 tablet (10 mg total) by mouth daily before breakfast. 30 tablet 11   fenofibrate (TRICOR) 145 MG tablet Take 1 tablet (145 mg total) by mouth daily. 30 tablet 11   FEROSUL 325 (65 Fe) MG tablet Take 1 tablet (325 mg total) by mouth daily. 30 tablet 1   haloperidol (HALDOL) 5 MG tablet Take 1 tablet (5 mg total) by mouth at bedtime. 30 tablet 0   levocetirizine (XYZAL) 5 MG tablet Take 1 tablet (5 mg total) by mouth at bedtime. 30 tablet 0   levothyroxine (SYNTHROID) 88 MCG tablet Take 1 tablet (88 mcg total) by mouth daily. 30 tablet 1   lidocaine (LIDODERM) 5 % Place 1 patch onto the skin  daily. Remove & Discard patch within 12 hours or as directed by MD 30 patch 0   loperamide (IMODIUM) 2 MG capsule Take 1 capsule (2 mg total) by mouth 4 (four) times daily as needed for diarrhea or loose stools. 120 capsule 0   losartan (COZAAR) 25 MG tablet Take 1 tablet (25 mg total) by mouth daily. 30 tablet 11   nitroGLYCERIN (NITROSTAT) 0.4 MG SL tablet Place 1 tablet (0.4 mg total) under the tongue every 5 (five) minutes as needed for chest pain. 12 tablet 0   nystatin cream (MYCOSTATIN) Apply 1 Application topically 2 (two) times daily as needed for dry skin.     omeprazole (PRILOSEC) 20 MG capsule Take 1 capsule (20 mg total) by mouth 2 (two) times daily as needed (  acid reflux). 60 capsule 0   OXYGEN Inhale 3 L into the lungs continuous.     potassium chloride (KLOR-CON) 10 MEQ tablet TAKE 1 TABLET (10 MEQ TOTAL) BY MOUTH 2 (TWO) TIMES DAILY. 60 tablet 0   predniSONE (DELTASONE) 20 MG tablet Take 2 tablets (40 mg total) by mouth daily with breakfast. For the next four days 8 tablet 0   predniSONE (STERAPRED UNI-PAK 21 TAB) 10 MG (21) TBPK tablet Take as directed 21 tablet 3   rosuvastatin (CRESTOR) 10 MG tablet Take 1 tablet (10 mg total) by mouth every evening. 30 tablet 11   senna-docusate (SENOKOT-S) 8.6-50 MG tablet Take 1 tablet by mouth daily. 30 tablet 5   spironolactone (ALDACTONE) 25 MG tablet Take 1 tablet (25 mg total) by mouth daily. 30 tablet 11   SUBOXONE 4-1 MG FILM Place 0.5-1 Film under the tongue 3 (three) times daily as needed (Withdrawal).     torsemide (DEMADEX) 20 MG tablet Take 2 tablets (40 mg total) by mouth 2 (two) times daily. 120 tablet 11   traZODone (DESYREL) 50 MG tablet Take 2 tablets (100 mg total) by mouth at bedtime. 60 tablet 0   umeclidinium-vilanterol (ANORO ELLIPTA) 62.5-25 MCG/ACT AEPB Inhale 1 puff into the lungs daily. 98 each 0   ondansetron (ZOFRAN) 4 MG tablet Take 4 mg by mouth every 8 (eight) hours as needed for nausea or vomiting. (Patient not  taking: Reported on 02/02/2023)     polyethylene glycol (MIRALAX / GLYCOLAX) 17 g packet Take 17 g by mouth daily as needed for mild constipation. (Patient not taking: Reported on 05/23/2023) 30 each 0   No current facility-administered medications for this encounter.   Allergies  Allergen Reactions   Bee Venom Swelling and Other (See Comments)    "All over my body" (swelling)   Fioricet [Butalbital-Apap-Caffeine] Nausea And Vomiting and Rash   Ibuprofen Rash and Other (See Comments)    Severe rash   Lamisil [Terbinafine] Rash and Other (See Comments)    Pt states this causes her to "feel funny"   Nsaids Other (See Comments)    Per MD's orders    Social History   Socioeconomic History   Marital status: Single    Spouse name: Not on file   Number of children: 1   Years of education: Not on file   Highest education level: Not on file  Occupational History   Occupation: disabled  Tobacco Use   Smoking status: Former    Current packs/day: 0.00    Average packs/day: 0.5 packs/day for 41.0 years (20.5 ttl pk-yrs)    Types: Cigarettes    Start date: 07/1975    Quit date: 07/2016    Years since quitting: 6.8   Smokeless tobacco: Never  Vaping Use   Vaping status: Never Used  Substance and Sexual Activity   Alcohol use: Not Currently    Comment: quit 12 years ago   Drug use: Not Currently    Types: Marijuana, Cocaine    Comment: quit 12 years ago   Sexual activity: Yes  Other Topics Concern   Not on file  Social History Narrative   Not on file   Social Drivers of Health   Financial Resource Strain: Low Risk  (10/24/2022)   Overall Financial Resource Strain (CARDIA)    Difficulty of Paying Living Expenses: Not very hard  Food Insecurity: Food Insecurity Present (12/14/2022)   Hunger Vital Sign    Worried About Programme researcher, broadcasting/film/video in  the Last Year: Often true    Ran Out of Food in the Last Year: Often true  Transportation Needs: Unmet Transportation Needs (12/14/2022)    PRAPARE - Administrator, Civil Service (Medical): Yes    Lack of Transportation (Non-Medical): Yes  Physical Activity: Inactive (02/01/2021)   Exercise Vital Sign    Days of Exercise per Week: 0 days    Minutes of Exercise per Session: 0 min  Stress: Stress Concern Present (02/23/2021)   [employer] of Occupational Health - Occupational Stress Questionnaire    Feeling of Stress : To some extent  Social Connections: Unknown (08/21/2021)   Received from [facility]   Social Network    Social Network: Not on file  Intimate Partner Violence: Not At Risk (12/14/2022)   Humiliation, Afraid, Rape, and Kick questionnaire    Fear of Current or Ex-Partner: No    Emotionally Abused: No    Physically Abused: No    Sexually Abused: No   Family History  Problem Relation Age of Onset   Cancer Father        thought to be due to exposure to concrete   Diabetes Mother    Thyroid cancer Mother        dx in her 42s-60s   Cancer Maternal Uncle        2 uncles with cancer NOS   Brain cancer Paternal Aunt    Cancer Cousin        maternal first cousin - NOS   Cancer Cousin        maternal first cousin - NOS   Wt Readings from Last 3 Encounters:  05/23/23 (!) 143.9 kg (317 lb 3.2 oz)  05/08/23 122.5 kg (270 lb)  03/08/23 122 kg (268 lb 15.4 oz)   BP (!) 142/72 (BP Location: Right Arm, Patient Position: Sitting)   Pulse 78   Wt (!) 143.9 kg (317 lb 3.2 oz)   SpO2 95%   BMI 52.78 kg/m   PHYSICAL EXAM: General:  NAD. No resp difficulty, arrived in [facility] on oxygen, chronically-ill appearing HEENT: Normal Neck: Supple. No JVD. Thick neck Cor: Regular rate & rhythm. No rubs, gallops or murmurs. Lungs: Clear, diminished in bases Abdomen: Soft, obese, nontender, nondistended.  Extremities: No cyanosis, clubbing, rash, edema Neuro: Alert & oriented x 3, moves all 4 extremities w/o difficulty. Labile mood during visit.  ASSESSMENT & PLAN: 1. Chronic diastolic CHF:  Has had multiple prior admissions with CHF and COPD exacerbations.  Echo 05/2022 with EF 55-60%, D-shaped septum, Grade IDD.  NYHA class III, functional class confounded by body habitus and generalized arthritis. Exam difficult for volume, but she does not look significantly volume overloaded on exam. Reds normal at 32%. - Continue torsemide 40 mg bid, BMET/BNP today.  - Continue Farxiga 10 mg daily.  No GU symptoms - Continue losartan 25 mg daily.  - Continue spironolactone 25 mg daily.  - Update echo - I think she would benefit from Cardiomems placement given multiple admissions for CHF and difficult exam.  We discussed risks and benefits and she agrees to placement. Unfortunately she has Medicaid and they will not cover. 2. HTN: BP mildly elevated today. - Increase Coreg (see below) 3. Obesity: Body mass index is 52.78 kg/m. - Would refer in future for GLP-1 agonist.  4. Chronic Hypoxemic Respiratory Failure:  Suspect due to COPD and OHS. She is on 2L home oxygen.  - Should follow with pulmonary.  5. SVT:  Recent episode with EMS, rhythm broke with 6 mg of adenosine. 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 - Increase Coreg to 6.25 mg bid.  - Update echo.  - Place 2 week live Zio to assess any arrhythmia  - Labs today =>TSH, CBC, BMET and Mag - Consider referral to EP next, pending Zio results. 6. SDOH: She lives alone, has HH aide. Her medications are mailed to her and she has refills. She has transportation and Medicaid.  - She is followed closely by psych at behavioral health.  Follow up in 3 months with Dr. Kathreen Cornfield [facility] FNP-BC 05/23/2023

## 2023-05-23 NOTE — Patient Instructions (Addendum)
Thank you for coming in today  If you had labs drawn today, any labs that are abnormal the clinic will call you No news is good news  Medications: Increase Coreg to 6.25 mg 1 tablet daily   Your provider has recommended that  you wear a live Zio Patch for 14 days.  This monitor will record your heart rhythm for our review.  IF you have any symptoms while wearing the monitor please press the button.  If you have any issues with the patch or you notice a red or orange light on it please call the company at [phone].  Once you remove the patch please mail it back to the company as soon as possible so we can get the results.   Your physician has requested that you have an echocardiogram. Echocardiography is a painless test that uses sound waves to create images of your heart. It provides your doctor with information about the size and shape of your heart and how well your heart's chambers and valves are working. This procedure takes approximately one hour. There are no restrictions for this procedure.      Follow up appointments:  Your physician recommends that you schedule a follow-up appointment in:  3 months With Dr. Earlean Shawl will receive a reminder letter in the mail a few months in advance. If you don't receive a letter, please call our office to schedule the follow-up appointment.    Do the following things EVERYDAY: Weigh yourself in the morning before breakfast. Write it down and keep it in a log. Take your medicines as prescribed Eat low salt foods--Limit salt (sodium) to 2000 mg per day.  Stay as active as you can everyday Limit all fluids for the day to less than 2 liters   At the [facility], you and your health needs are our priority. As part of our continuing mission to provide you with exceptional heart care, we have created designated Provider Care Teams. These Care Teams include your primary Cardiologist (physician) and Advanced Practice  Providers (APPs- Physician Assistants and Nurse Practitioners) who all work together to provide you with the care you need, when you need it.   You may see any of the following providers on your designated Care Team at your next follow up: Dr Arvilla Meres Dr Marca Ancona Dr. Marcos Eke, NP Robbie Lis, [place] [facility] [place], [place] Brynda Peon, NP Karle Plumber, PharmD   Please be sure to bring in all your medications bottles to every appointment.    Thank you for choosing [facility]  If you have any questions or concerns before your next appointment please send [place] a message through [place] or call our office at [phone].    TO LEAVE A MESSAGE FOR THE NURSE SELECT OPTION 2, PLEASE LEAVE A MESSAGE INCLUDING: YOUR NAME DATE OF BIRTH CALL BACK NUMBER REASON FOR CALL**this is important as we prioritize the call backs  YOU WILL RECEIVE A CALL BACK THE SAME DAY AS LONG AS YOU CALL BEFORE 4:00 PM

## 2023-05-24 ENCOUNTER — Other Ambulatory Visit: Payer: Self-pay | Admitting: Family Medicine

## 2023-05-24 DIAGNOSIS — J449 Chronic obstructive pulmonary disease, unspecified: Secondary | ICD-10-CM

## 2023-05-24 NOTE — Addendum Note (Signed)
Encounter addended by: Faythe Casa, CMA on: 05/24/2023 9:14 AM  Actions taken: Clinical Note Signed

## 2023-05-24 NOTE — Progress Notes (Signed)
  Late Document: Zio placed on 05/23/23  Zio patch placed onto patient.  All instructions and information reviewed with patient, they verbalize understanding with no questions.

## 2023-05-26 ENCOUNTER — Other Ambulatory Visit: Payer: Self-pay

## 2023-05-26 ENCOUNTER — Emergency Department (HOSPITAL_COMMUNITY)
Admission: EM | Admit: 2023-05-26 | Discharge: 2023-05-27 | Disposition: A | Discharge status: Home / Self Care | Payer: Medicaid Other | Attending: Emergency Medicine | Admitting: Emergency Medicine

## 2023-05-26 ENCOUNTER — Encounter (HOSPITAL_COMMUNITY): Payer: Self-pay

## 2023-05-26 DIAGNOSIS — J45909 Unspecified asthma, uncomplicated: Secondary | ICD-10-CM | POA: Diagnosis not present

## 2023-05-26 DIAGNOSIS — E119 Type 2 diabetes mellitus without complications: Secondary | ICD-10-CM | POA: Diagnosis not present

## 2023-05-26 DIAGNOSIS — Z79899 Other long term (current) drug therapy: Secondary | ICD-10-CM | POA: Insufficient documentation

## 2023-05-26 DIAGNOSIS — Z4689 Encounter for fitting and adjustment of other specified devices: Secondary | ICD-10-CM | POA: Diagnosis present

## 2023-05-26 DIAGNOSIS — Z85850 Personal history of malignant neoplasm of thyroid: Secondary | ICD-10-CM | POA: Diagnosis not present

## 2023-05-26 DIAGNOSIS — I509 Heart failure, unspecified: Secondary | ICD-10-CM | POA: Diagnosis not present

## 2023-05-26 DIAGNOSIS — I110 Hypertensive heart disease with heart failure: Secondary | ICD-10-CM | POA: Diagnosis not present

## 2023-05-26 DIAGNOSIS — J449 Chronic obstructive pulmonary disease, unspecified: Secondary | ICD-10-CM | POA: Diagnosis not present

## 2023-05-26 DIAGNOSIS — Z7982 Long term (current) use of aspirin: Secondary | ICD-10-CM | POA: Diagnosis not present

## 2023-05-26 MED ORDER — OXYCODONE HCL 5 MG PO TABS
5.0000 mg | ORAL_TABLET | Freq: Once | ORAL | Status: AC
  Administered 2023-05-26: 5 mg via ORAL
  Filled 2023-05-26: qty 1

## 2023-05-26 MED ORDER — ACETAMINOPHEN 500 MG PO TABS
1000.0000 mg | ORAL_TABLET | Freq: Once | ORAL | Status: AC
  Administered 2023-05-26: 1000 mg via ORAL
  Filled 2023-05-26: qty 2

## 2023-05-26 NOTE — ED Notes (Addendum)
Patient demanding to take home meds despite education from nurse not to take any meds from home.  Patient stating "I need to take these meds before I go off on someone."

## 2023-05-26 NOTE — ED Triage Notes (Signed)
Pt BIBEMS from home with complaints of "tape being placed on her halter monitor wrong because the nurse did not know how to do her job."

## 2023-05-26 NOTE — ED Notes (Signed)
Patient flagging staff down and asking for pain medicine, and demanding to be put into a room.  Patient educated that MD would have to see patient before she could get an order for pain medication, and that I didn't have a room to put her in at this time, but if the EDP felt she needed a room, we could re-evaluate.  Patient verbalized understanding.

## 2023-05-26 NOTE — Discharge Instructions (Addendum)
Please follow up with your cardiologist as originally scheduled. Please follow up with your primary care physician within 1-2 weeks about your chronic pain.

## 2023-05-26 NOTE — ED Provider Notes (Signed)
 [place] EMERGENCY DEPARTMENT AT [facility] Provider Note   CSN: [id number] Arrival date & time: 05/26/23  2001     History  Chief Complaint  Patient presents with   tape Placed wrong per patient    Michele Owens is a 63 y.o. female with PMH as listed below who presents  BIBEMS from home with complaints of "tape being placed on her holter monitor wrong because the nurse did not know how to do her job." She states the tape is pulling at her skin in awkward ways and it is causing her pain. She also complains of "all over pain." When asked more specifically about chest or abdominal pain, she states, "I just have pain everywhere" which she states is chronic for her.   Past Medical History:  Diagnosis Date   Anxiety    Arthritis    Asthma    Chronic diastolic CHF (congestive heart failure) (HCC)    COPD (chronic obstructive pulmonary disease) (HCC)    Uses Oxygen at night   Depression    Diabetes mellitus without complication (HCC)    GERD (gastroesophageal reflux disease)    Gout    Headache    migraines   History of DVT of lower extremity    Hypertension    Morbid obesity with BMI of 45.0-49.9, adult (HCC)    Thyroid cancer (HCC) 09/23/2016       Home Medications Prior to Admission medications   Medication Sig Start Date End Date Taking? Authorizing Provider  acetaminophen (TYLENOL) 500 MG tablet Take 1,000 mg by mouth every 6 (six) hours as needed for moderate pain (pain score 4-6) or headache.    [provider]  albuterol (VENTOLIN HFA) 108 (90 Base) MCG/ACT inhaler Inhale 1-2 puffs into the lungs every 6 (six) hours as needed for wheezing or shortness of breath. 11/24/22   Laurey Morale, MD  allopurinol (ZYLOPRIM) 100 MG tablet Take 100 mg by mouth daily.    [provider]  aspirin EC 81 MG tablet Take 81 mg by mouth daily. Swallow whole.    [provider]  blood glucose meter kit and supplies KIT Dispense based on patient  and insurance preference. Use up to four times daily as directed. 12/25/20   Azucena Fallen, MD  budesonide (PULMICORT) 0.25 MG/2ML nebulizer solution Inhale 2 mLs (0.25 mg total) by nebulization 2 (two) times daily. 11/24/22   Laurey Morale, MD  busPIRone (BUSPAR) 5 MG tablet Take 1 tablet (5 mg total) by mouth 3 (three) times daily. 02/07/23   Avanell Shackleton, NP-C  butalbital-acetaminophen-caffeine (FIORICET) [phone] MG tablet Take 1-2 tablets by mouth every 6 (six) hours as needed for headache. 05/08/23 05/07/24  [name], PA-C  calcitRIOL (ROCALTROL) 0.5 MCG capsule Take 1 capsule (0.5 mcg total) by mouth daily. 09/26/16   Hongalgi, Maximino Greenland, MD  carvedilol (COREG) 6.25 MG tablet Take 1 tablet (6.25 mg total) by mouth 2 (two) times daily. 05/23/23   Milford, Anderson Malta, FNP  clonazePAM (KLONOPIN) 2 MG tablet Take 1 tablet (2 mg total) by mouth 3 (three) times daily. 11/24/22   Laurey Morale, MD  cyclobenzaprine (FLEXERIL) 10 MG tablet Take 10 mg by mouth 3 (three) times daily as needed for muscle spasms. 11/19/19   [provider]  EPINEPHrine 0.3 mg/0.3 mL IJ SOAJ injection Inject 0.3 mg into the muscle once as needed for anaphylaxis.    [provider]  FARXIGA 10 MG TABS tablet  Take 1 tablet (10 mg total) by mouth daily before breakfast. 02/02/23   Laurey Morale, MD  fenofibrate (TRICOR) 145 MG tablet Take 1 tablet (145 mg total) by mouth daily. 02/02/23   Laurey Morale, MD  FEROSUL 325 (65 Fe) MG tablet Take 1 tablet (325 mg total) by mouth daily. 02/09/23   Henson, Vickie L, NP-C  haloperidol (HALDOL) 5 MG tablet Take 1 tablet (5 mg total) by mouth at bedtime. 12/16/22 05/23/23  Willeen Niece, MD  levocetirizine (XYZAL) 5 MG tablet Take 1 tablet (5 mg total) by mouth at bedtime. 11/24/22   Laurey Morale, MD  levothyroxine (SYNTHROID) 88 MCG tablet Take 1 tablet (88 mcg total) by mouth daily. 02/09/23   Henson, Vickie L, NP-C  lidocaine (LIDODERM) 5 % Place  1 patch onto the skin daily. Remove & Discard patch within 12 hours or as directed by MD 10/12/22   Blue, Soijett A, PA-C  loperamide (IMODIUM) 2 MG capsule Take 1 capsule (2 mg total) by mouth 4 (four) times daily as needed for diarrhea or loose stools. 11/24/22   Laurey Morale, MD  losartan (COZAAR) 25 MG tablet Take 1 tablet (25 mg total) by mouth daily. 02/02/23   Laurey Morale, MD  nitroGLYCERIN (NITROSTAT) 0.4 MG SL tablet Place 1 tablet (0.4 mg total) under the tongue every 5 (five) minutes as needed for chest pain. 05/23/23   Milford, Anderson Malta, FNP  nystatin cream (MYCOSTATIN) Apply 1 Application topically 2 (two) times daily as needed for dry skin. 09/26/22   [provider]  omeprazole (PRILOSEC) 20 MG capsule Take 1 capsule (20 mg total) by mouth 2 (two) times daily as needed (acid reflux). 11/24/22   Laurey Morale, MD  ondansetron (ZOFRAN) 4 MG tablet Take 4 mg by mouth every 8 (eight) hours as needed for nausea or vomiting. Patient not taking: Reported on 02/02/2023 01/16/22   [provider]  OXYGEN Inhale 3 L into the lungs continuous.    [provider]  polyethylene glycol (MIRALAX / GLYCOLAX) 17 g packet Take 17 g by mouth daily as needed for mild constipation. Patient not taking: Reported on 05/23/2023 11/24/22   Laurey Morale, MD  potassium chloride (KLOR-CON) 10 MEQ tablet TAKE 1 TABLET (10 MEQ TOTAL) BY MOUTH 2 (TWO) TIMES DAILY. 05/10/23   Laurey Morale, MD  predniSONE (DELTASONE) 20 MG tablet Take 2 tablets (40 mg total) by mouth daily with breakfast. For the next four days 05/13/23   Gerhard Munch, MD  predniSONE (STERAPRED UNI-PAK 21 TAB) 10 MG (21) TBPK tablet Take as directed 05/17/23   Tarry Kos, MD  rosuvastatin (CRESTOR) 10 MG tablet Take 1 tablet (10 mg total) by mouth every evening. 02/02/23   Laurey Morale, MD  senna-docusate (SENOKOT-S) 8.6-50 MG tablet Take 1 tablet by mouth daily. 02/07/23   Henson, Vickie L, NP-C   spironolactone (ALDACTONE) 25 MG tablet Take 1 tablet (25 mg total) by mouth daily. 02/02/23   Laurey Morale, MD  SUBOXONE 4-1 MG FILM Place 0.5-1 Film under the tongue 3 (three) times daily as needed (Withdrawal). 10/16/22   [provider]  torsemide (DEMADEX) 20 MG tablet Take 2 tablets (40 mg total) by mouth 2 (two) times daily. 11/24/22   Laurey Morale, MD  traZODone (DESYREL) 50 MG tablet Take 2 tablets (100 mg total) by mouth at bedtime. 02/07/23   Henson, Vickie L, NP-C  umeclidinium-vilanterol (ANORO ELLIPTA) 62.5-25 MCG/ACT AEPB  Inhale 1 puff into the lungs daily. 02/09/23   Henson, Vickie L, NP-C      Allergies    Bee venom, Fioricet [butalbital-apap-caffeine], Ibuprofen, Lamisil [terbinafine], and Nsaids    Review of Systems   Review of Systems A 10 point review of systems was performed and is negative unless otherwise reported in HPI.  Physical Exam Updated Vital Signs BP (!) 147/81 (BP Location: Left Arm)   Pulse 77   Temp 99.2 F (37.3 C) (Oral)   Resp 18   Ht 5\' 5"  (1.651 m)   Wt (!) 142 kg   SpO2 99%   BMI 52.09 kg/m  Physical Exam General: Normal appearing female, lying in bed.  HEENT: Sclera anicteric, MMM, trachea midline.  Cardiology: RRR, no murmurs/rubs/gallops. Holter monitor taped to chest with clear medical tape.  Resp: Normal respiratory rate and effort. CTAB, no wheezes, rhonchi, crackles.  Abd: Soft, non-tender, non-distended. No rebound tenderness or guarding.  GU: Deferred. MSK: No peripheral edema or signs of trauma. Skin: warm, dry.  Neuro: A&Ox4, CNs II-XII grossly intact. MAEs. Sensation grossly intact.  Psych: Irritable mood and affect.   ED Results / Procedures / Treatments   Labs (all labs ordered are listed, but only abnormal results are displayed) Labs Reviewed - No data to display  EKG None  Radiology No results found.  Procedures Procedures    Medications Ordered in ED Medications - No data to display  ED  Course/ Medical Decision Making/ A&P                          Medical Decision Making Risk OTC drugs. Prescription drug management.    MDM:    Patient requests tape be changed on holter monitor. I discuss with charge nurse who states we do not have here in the ED the specific mechanisms often used to tape holter monitors. Seems that a tegaderm might be more comfortable for patient. Will utilize a large tegaderm to secure holter monitor on the patient. She can f/u as originally planned with cardiology. She has no focal pain and no acute other complains. Will be DC'd to o/p f/u.       Additional history obtained from chart review, EMS.   Reevaluation: After the interventions noted above, I reevaluated the patient and found that they have :improved  Social Determinants of Health: Lives independently  Disposition:  DC w/ discharge instructions/return precautions. All questions answered to patient's satisfaction.    Co morbidities that complicate the patient evaluation  Past Medical History:  Diagnosis Date   Anxiety    Arthritis    Asthma    Chronic diastolic CHF (congestive heart failure) (HCC)    COPD (chronic obstructive pulmonary disease) (HCC)    Uses Oxygen at night   Depression    Diabetes mellitus without complication (HCC)    GERD (gastroesophageal reflux disease)    Gout    Headache    migraines   History of DVT of lower extremity    Hypertension    Morbid obesity with BMI of 45.0-49.9, adult (HCC)    Thyroid cancer (HCC) 09/23/2016     Medicines No orders of the defined types were placed in this encounter.   I have reviewed the patients home medicines and have made adjustments as needed  Problem List / ED Course: Problem List Items Addressed This Visit   None Visit Diagnoses       Encounter for fitting and adjustment of  other specified devices    -  Primary                   This note was created using dictation software, which may  contain spelling or grammatical errors.    Loetta Rough, MD 06/02/23 [phone]

## 2023-05-26 NOTE — ED Notes (Signed)
Pt while in bathroom "I ain't sitting on that until you bleach the seat and put toilet paper on it, I know you guys don't clean shit around here. Clean the pole before I use it. I am in so much pain this is ridiculous." While getting patient to wheelchair "You do not know what the hell you people are doing to me and that bitch did this wrong." Pt continues to curse and inform staff that we and the other place do not know what we are doing.

## 2023-05-26 NOTE — ED Notes (Addendum)
Pt informed staff while applying dressing change "I am going to go off on that witch, she did not know what she was doing. I can not wait to see this women again because I am going to give it to her."

## 2023-05-26 NOTE — ED Notes (Signed)
Pt informed this Clinical research associate "if you do not let me take my meds I am going to knock you out and flip out because I am feeling very angry." Pt educated not to take her home medications. Pt "I am taking them, This is my freak out medicines."

## 2023-05-26 NOTE — ED Triage Notes (Signed)
Pt arrived from home via [facility] c/o tape around heart monitor pulling her skin and causing it to hurt. Pt states that they did not know what they were doing and that they did not know how to put it on.

## 2023-05-26 NOTE — ED Notes (Signed)
Patient assisted to restroom and back with wheelchair.  When assisting patient back into bed, patient cursing at staff and states "don't move my leg quick, if you do, I'm gonna have to knock you."  Patient educated that she can't threaten staff, and that we were trying to help her.

## 2023-05-26 NOTE — ED Notes (Signed)
Ptar called unable to give pick up time 

## 2023-05-27 NOTE — ED Notes (Signed)
Following moving patient room to room 7, PT refused to be placed back onto monitor and further vitals. At time of DC, PT refused DC vitals

## 2023-05-31 ENCOUNTER — Other Ambulatory Visit: Payer: Self-pay

## 2023-05-31 ENCOUNTER — Telehealth: Payer: Self-pay | Admitting: Cardiology

## 2023-05-31 ENCOUNTER — Encounter (HOSPITAL_COMMUNITY): Payer: Self-pay | Admitting: Emergency Medicine

## 2023-05-31 ENCOUNTER — Emergency Department (HOSPITAL_COMMUNITY)
Admission: EM | Admit: 2023-05-31 | Discharge: 2023-05-31 | Disposition: A | Discharge status: Home / Self Care | Payer: Medicaid Other | Attending: Emergency Medicine | Admitting: Emergency Medicine

## 2023-05-31 DIAGNOSIS — L231 Allergic contact dermatitis due to adhesives: Secondary | ICD-10-CM | POA: Insufficient documentation

## 2023-05-31 DIAGNOSIS — J449 Chronic obstructive pulmonary disease, unspecified: Secondary | ICD-10-CM | POA: Insufficient documentation

## 2023-05-31 DIAGNOSIS — Z7951 Long term (current) use of inhaled steroids: Secondary | ICD-10-CM | POA: Insufficient documentation

## 2023-05-31 DIAGNOSIS — R21 Rash and other nonspecific skin eruption: Secondary | ICD-10-CM | POA: Diagnosis present

## 2023-05-31 DIAGNOSIS — I110 Hypertensive heart disease with heart failure: Secondary | ICD-10-CM | POA: Diagnosis not present

## 2023-05-31 DIAGNOSIS — Z7982 Long term (current) use of aspirin: Secondary | ICD-10-CM | POA: Insufficient documentation

## 2023-05-31 DIAGNOSIS — Z79899 Other long term (current) drug therapy: Secondary | ICD-10-CM | POA: Diagnosis not present

## 2023-05-31 DIAGNOSIS — I509 Heart failure, unspecified: Secondary | ICD-10-CM | POA: Insufficient documentation

## 2023-05-31 MED ORDER — ACETAMINOPHEN 325 MG PO TABS
650.0000 mg | ORAL_TABLET | Freq: Once | ORAL | Status: AC
  Administered 2023-05-31: 650 mg via ORAL
  Filled 2023-05-31: qty 2

## 2023-05-31 NOTE — Discharge Instructions (Addendum)
Today you are seen for contact dermatitis likely due to the adhesive used to adhere your Holter monitor. Thank you for letting us treat you today. After performing a physical exam, I feel you are safe to go home. Please follow up with your PCP in the next several days and provide them with your records from this visit. Return to the Emergency Room if pain becomes severe or symptoms worsen.

## 2023-05-31 NOTE — ED Provider Notes (Cosign Needed Addendum)
[place] EMERGENCY DEPARTMENT AT [facility] Provider Note   CSN: [id number] Arrival date & time: 05/31/23  4098     History  Chief Complaint  Patient presents with   Rash    Michele Owens is a 63 y.o. female past medical history significant for COPD, hypertension, AKI, DVT, and CHF presents today for tenderness and redness where her Holter monitor was placed last week on 2/12.  Patient also endorses pruritus.  Patient denies fever, chills, chest pain, shortness of breath, nausea, or vomiting.   Rash      Home Medications Prior to Admission medications   Medication Sig Start Date End Date Taking? Authorizing Provider  acetaminophen (TYLENOL) 500 MG tablet Take 1,000 mg by mouth every 6 (six) hours as needed for moderate pain (pain score 4-6) or headache.    [provider]  albuterol (VENTOLIN HFA) 108 (90 Base) MCG/ACT inhaler Inhale 1-2 puffs into the lungs every 6 (six) hours as needed for wheezing or shortness of breath. 11/24/22   Laurey Morale, MD  allopurinol (ZYLOPRIM) 100 MG tablet Take 100 mg by mouth daily.    [provider]  aspirin EC 81 MG tablet Take 81 mg by mouth daily. Swallow whole.    [provider]  blood glucose meter kit and supplies KIT Dispense based on patient and insurance preference. Use up to four times daily as directed. 12/25/20   Azucena Fallen, MD  budesonide (PULMICORT) 0.25 MG/2ML nebulizer solution Inhale 2 mLs (0.25 mg total) by nebulization 2 (two) times daily. 11/24/22   Laurey Morale, MD  busPIRone (BUSPAR) 5 MG tablet Take 1 tablet (5 mg total) by mouth 3 (three) times daily. 02/07/23   Avanell Shackleton, NP-C  butalbital-acetaminophen-caffeine (FIORICET) [phone] MG tablet Take 1-2 tablets by mouth every 6 (six) hours as needed for headache. 05/08/23 05/07/24  [name], PA-C  calcitRIOL (ROCALTROL) 0.5 MCG capsule Take 1 capsule (0.5 mcg total) by mouth daily. 09/26/16   Hongalgi,  Maximino Greenland, MD  carvedilol (COREG) 6.25 MG tablet Take 1 tablet (6.25 mg total) by mouth 2 (two) times daily. 05/23/23   Milford, Anderson Malta, FNP  clonazePAM (KLONOPIN) 2 MG tablet Take 1 tablet (2 mg total) by mouth 3 (three) times daily. 11/24/22   Laurey Morale, MD  cyclobenzaprine (FLEXERIL) 10 MG tablet Take 10 mg by mouth 3 (three) times daily as needed for muscle spasms. 11/19/19   [provider]  EPINEPHrine 0.3 mg/0.3 mL IJ SOAJ injection Inject 0.3 mg into the muscle once as needed for anaphylaxis.    [provider]  FARXIGA 10 MG TABS tablet Take 1 tablet (10 mg total) by mouth daily before breakfast. 02/02/23   Laurey Morale, MD  fenofibrate (TRICOR) 145 MG tablet Take 1 tablet (145 mg total) by mouth daily. 02/02/23   Laurey Morale, MD  FEROSUL 325 (65 Fe) MG tablet Take 1 tablet (325 mg total) by mouth daily. 02/09/23   Henson, Vickie L, NP-C  haloperidol (HALDOL) 5 MG tablet Take 1 tablet (5 mg total) by mouth at bedtime. 12/16/22 05/23/23  Willeen Niece, MD  levocetirizine (XYZAL) 5 MG tablet Take 1 tablet (5 mg total) by mouth at bedtime. 11/24/22   Laurey Morale, MD  levothyroxine (SYNTHROID) 88 MCG tablet Take 1 tablet (88 mcg total) by mouth daily. 02/09/23   Henson, Vickie L, NP-C  lidocaine (LIDODERM) 5 % Place 1 patch onto the skin daily. Remove &  Discard patch within 12 hours or as directed by MD 10/12/22   Blue, Soijett A, PA-C  loperamide (IMODIUM) 2 MG capsule Take 1 capsule (2 mg total) by mouth 4 (four) times daily as needed for diarrhea or loose stools. 11/24/22   Laurey Morale, MD  losartan (COZAAR) 25 MG tablet Take 1 tablet (25 mg total) by mouth daily. 02/02/23   Laurey Morale, MD  nitroGLYCERIN (NITROSTAT) 0.4 MG SL tablet Place 1 tablet (0.4 mg total) under the tongue every 5 (five) minutes as needed for chest pain. 05/23/23   Milford, Anderson Malta, FNP  nystatin cream (MYCOSTATIN) Apply 1 Application topically 2 (two) times daily as needed for  dry skin. 09/26/22   [provider]  omeprazole (PRILOSEC) 20 MG capsule Take 1 capsule (20 mg total) by mouth 2 (two) times daily as needed (acid reflux). 11/24/22   Laurey Morale, MD  ondansetron (ZOFRAN) 4 MG tablet Take 4 mg by mouth every 8 (eight) hours as needed for nausea or vomiting. Patient not taking: Reported on 02/02/2023 01/16/22   [provider]  OXYGEN Inhale 3 L into the lungs continuous.    [provider]  polyethylene glycol (MIRALAX / GLYCOLAX) 17 g packet Take 17 g by mouth daily as needed for mild constipation. Patient not taking: Reported on 05/23/2023 11/24/22   Laurey Morale, MD  potassium chloride (KLOR-CON) 10 MEQ tablet TAKE 1 TABLET (10 MEQ TOTAL) BY MOUTH 2 (TWO) TIMES DAILY. 05/10/23   Laurey Morale, MD  predniSONE (DELTASONE) 20 MG tablet Take 2 tablets (40 mg total) by mouth daily with breakfast. For the next four days 05/13/23   Gerhard Munch, MD  predniSONE (STERAPRED UNI-PAK 21 TAB) 10 MG (21) TBPK tablet Take as directed 05/17/23   Tarry Kos, MD  rosuvastatin (CRESTOR) 10 MG tablet Take 1 tablet (10 mg total) by mouth every evening. 02/02/23   Laurey Morale, MD  senna-docusate (SENOKOT-S) 8.6-50 MG tablet Take 1 tablet by mouth daily. 02/07/23   Henson, Vickie L, NP-C  spironolactone (ALDACTONE) 25 MG tablet Take 1 tablet (25 mg total) by mouth daily. 02/02/23   Laurey Morale, MD  SUBOXONE 4-1 MG FILM Place 0.5-1 Film under the tongue 3 (three) times daily as needed (Withdrawal). 10/16/22   [provider]  torsemide (DEMADEX) 20 MG tablet Take 2 tablets (40 mg total) by mouth 2 (two) times daily. 11/24/22   Laurey Morale, MD  traZODone (DESYREL) 50 MG tablet Take 2 tablets (100 mg total) by mouth at bedtime. 02/07/23   Henson, Vickie L, NP-C  umeclidinium-vilanterol (ANORO ELLIPTA) 62.5-25 MCG/ACT AEPB Inhale 1 puff into the lungs daily. 02/09/23   Henson, Vickie L, NP-C      Allergies    Bee venom, Fioricet  [butalbital-apap-caffeine], Ibuprofen, Lamisil [terbinafine], and Nsaids    Review of Systems   Review of Systems  Skin:  Positive for rash.    Physical Exam Updated Vital Signs BP 122/62   Pulse 70   Temp 98.6 F (37 C) (Oral)   Resp 19   SpO2 100%  Physical Exam Vitals and nursing note reviewed.  Constitutional:      General: She is not in acute distress.    Appearance: She is well-developed. She is obese. She is not ill-appearing, toxic-appearing or diaphoretic.  HENT:     Head: Normocephalic and atraumatic.     Right Ear: External ear normal.     Left Ear: External  ear normal.     Nose: Nose normal.  Eyes:     Extraocular Movements: Extraocular movements intact.     Conjunctiva/sclera: Conjunctivae normal.  Cardiovascular:     Rate and Rhythm: Normal rate and regular rhythm.     Pulses: Normal pulses.  Pulmonary:     Effort: Pulmonary effort is normal. No respiratory distress.     Breath sounds: Normal breath sounds.  Abdominal:     Palpations: Abdomen is soft.  Musculoskeletal:        General: No swelling.     Cervical back: Neck supple.  Skin:    General: Skin is warm and dry.     Capillary Refill: Capillary refill takes less than 2 seconds.     Comments: Patient has very mild erythema localized to where she currently has her ZIO Holter monitor placed with adhesive tape.  No warmth or streaking noted.  Neurological:     General: No focal deficit present.     Mental Status: She is alert.     Motor: No weakness.  Psychiatric:        Mood and Affect: Mood normal.     ED Results / Procedures / Treatments   Labs (all labs ordered are listed, but only abnormal results are displayed) Labs Reviewed - No data to display  EKG None  Radiology No results found.  Procedures Procedures    Medications Ordered in ED Medications  acetaminophen (TYLENOL) tablet 650 mg (has no administration in time range)    ED Course/ Medical Decision Making/ A&P                                  Medical Decision Making Risk OTC drugs.   This patient presents to the ED with chief complaint(s) of rash with pertinent past medical history of COPD, obesity which further complicates the presenting complaint. The complaint involves an extensive differential diagnosis and also carries with it a high risk of complications and morbidity.    The differential diagnosis includes contact dermatitis, shingles, cellulitis, abscess  Additional history obtained: Records reviewed cardiology progress notes  ED Course and Reassessment: Adhesive replacement Vaseline gauze and surgical tape.  Consultation: - Consulted or discussed management/test interpretation w/ external professional: None  Consideration for admission or further workup: Considered for admission further workup however patient's vital signs and physical exam were reassuring.  Patient has no signs or symptoms concerning for infectious process.  Patient symptoms likely due to contact dermatitis from the adhesive used to adhere the ZIO Holter monitor to the skin.  Patient should follow-up with primary care if her symptoms persist for further evaluation and treatment.         Final Clinical Impression(s) / ED Diagnoses Final diagnoses:  Contact dermatitis due to adhesives, unspecified contact dermatitis type    Rx / DC Orders ED Discharge Orders     None         Dolphus Jenny, PA-C 05/31/23 0719    Dolphus Jenny, PA-C 05/31/23 0728    Anders Simmonds T, DO 06/01/23 [phone]

## 2023-05-31 NOTE — Telephone Encounter (Signed)
Attempted to call patient, call straight to VM.

## 2023-05-31 NOTE — ED Notes (Signed)
[facility] called, eta "30-45 min"

## 2023-05-31 NOTE — ED Triage Notes (Signed)
BIBA Per EMS: Pt coming from home w/ c/o tenderness & redness to area where halter monitor was placed last week.  CBG 245 142/82 BP  86 HR  18 RR  94% on 2L. 2L at baseline.

## 2023-06-01 ENCOUNTER — Telehealth: Payer: Self-pay | Admitting: Orthopaedic Surgery

## 2023-06-01 ENCOUNTER — Other Ambulatory Visit: Payer: Self-pay | Admitting: Orthopaedic Surgery

## 2023-06-01 NOTE — Telephone Encounter (Signed)
Spoke with patient. I explained that Dr.Xu will not prescribe medication since she is in pain management.

## 2023-06-01 NOTE — Telephone Encounter (Signed)
She's in pain clinic at [facility]

## 2023-06-01 NOTE — Telephone Encounter (Signed)
Patient called. Would like oxycodone called in for her pain. Her cb# [phone]

## 2023-06-04 ENCOUNTER — Emergency Department (HOSPITAL_COMMUNITY)
Admission: EM | Admit: 2023-06-04 | Discharge: 2023-06-04 | Disposition: A | Discharge status: Home / Self Care | Payer: Medicaid Other | Attending: Emergency Medicine | Admitting: Emergency Medicine

## 2023-06-04 ENCOUNTER — Emergency Department (HOSPITAL_COMMUNITY): Payer: Medicaid Other

## 2023-06-04 ENCOUNTER — Other Ambulatory Visit: Payer: Self-pay

## 2023-06-04 DIAGNOSIS — M25562 Pain in left knee: Secondary | ICD-10-CM | POA: Insufficient documentation

## 2023-06-04 DIAGNOSIS — R0789 Other chest pain: Secondary | ICD-10-CM | POA: Diagnosis not present

## 2023-06-04 DIAGNOSIS — R002 Palpitations: Secondary | ICD-10-CM | POA: Diagnosis present

## 2023-06-04 DIAGNOSIS — Z79899 Other long term (current) drug therapy: Secondary | ICD-10-CM | POA: Insufficient documentation

## 2023-06-04 DIAGNOSIS — G8929 Other chronic pain: Secondary | ICD-10-CM | POA: Diagnosis not present

## 2023-06-04 DIAGNOSIS — Z6841 Body Mass Index (BMI) 40.0 and over, adult: Secondary | ICD-10-CM | POA: Insufficient documentation

## 2023-06-04 DIAGNOSIS — E669 Obesity, unspecified: Secondary | ICD-10-CM | POA: Insufficient documentation

## 2023-06-04 MED ORDER — CLONAZEPAM 0.5 MG PO TABS
2.0000 mg | ORAL_TABLET | Freq: Once | ORAL | Status: AC
  Administered 2023-06-04: 2 mg via ORAL
  Filled 2023-06-04: qty 4

## 2023-06-04 MED ORDER — OXYCODONE-ACETAMINOPHEN 5-325 MG PO TABS
2.0000 | ORAL_TABLET | Freq: Once | ORAL | Status: AC
  Administered 2023-06-04: 2 via ORAL
  Filled 2023-06-04: qty 2

## 2023-06-04 NOTE — ED Triage Notes (Signed)
 Pt bib ems from [facility]. Pt has had central cp that started yesterday. Pt endorses being sob and sweaty.

## 2023-06-04 NOTE — ED Provider Triage Note (Signed)
 Emergency Medicine Provider Triage Evaluation Note  Michele Owens , a 63 y.o. female  was evaluated in triage.  Pt complains of left chronic knee pain.  States that her left knee is bone-on-bone.  Denies any new injuries no fever chills.  Review of Systems  Positive: Pain with ambulation.  Uses a walker at baseline Negative: Fever  Physical Exam  BP (!) 126/47 (BP Location: Right Arm)   Pulse 73   Temp 97.8 F (36.6 C) (Oral)   Resp 18   Ht 1.651 m (5\' 5" )   Wt (!) 142 kg   SpO2 98%   BMI 52.09 kg/m  Gen:   Awake, no distress   Resp:  Normal effort  MSK:   Moves extremities without difficulty tender to palpation at left knee Other:    Medical Decision Making  Medically screening exam initiated at 1:37 PM.  Appropriate orders placed.  Michele Owens was informed that the remainder of the evaluation will be completed by another provider, this initial triage assessment does not replace that evaluation, and the importance of remaining in the ED until their evaluation is complete.  Patient given dose of Percocet here and will need to be reassessed   Lorre Nick, MD 06/04/23 1338

## 2023-06-04 NOTE — ED Notes (Signed)
 [facility] called, no eta at this time.

## 2023-06-04 NOTE — ED Notes (Signed)
 Pt refusing IV start at this time. Pt states "We will not be doing anything until I am in a room. I will have my privacy and my immune system is compromised." MD notified.

## 2023-06-04 NOTE — ED Notes (Signed)
 MD at bedside with patient. Pt stating, "Y'all can just call PTAR so I can go home." Pt still refusing interventions. Pt in no acute distress at this time. Pt alert and oriented x4, GCS 15. Pt respirations regular, chest expansion symmetrical.

## 2023-06-04 NOTE — ED Notes (Signed)
RN assisted pt to restroom. 

## 2023-06-04 NOTE — Discharge Instructions (Signed)
Be sure to follow-up with your physician.  Return here for concerning changes in your condition. 

## 2023-06-04 NOTE — ED Notes (Signed)
 Pt would like her device to be placed and pain medicine for left leg. Pt requesting her calm down pill.   EMS Vitals  BP 136/80 HR 80 2L 95% baseline

## 2023-06-04 NOTE — ED Provider Notes (Signed)
 [place] EMERGENCY DEPARTMENT AT [facility] Provider Note   CSN: [id number] Arrival date & time: 06/04/23  1307     History  Chief Complaint  Patient presents with   Palpitations    Michele Owens is a 63 y.o. female.  HPI Patient with chronic left knee pain presents with concern for that.  Not long of the patient's arrival described chest tightness as well.  She notes that she has been dealing with knee pain for long time, typically has some relief with Percocet, sees her physicians as an outpatient, but continues to have pain.  No new characteristics of the pain.  In regards to the patient's chest pain, she is currently wearing a Zio patch.    Home Medications Prior to Admission medications   Medication Sig Start Date End Date Taking? Authorizing Provider  acetaminophen (TYLENOL) 500 MG tablet Take 1,000 mg by mouth every 6 (six) hours as needed for moderate pain (pain score 4-6) or headache.    [provider]  albuterol (VENTOLIN HFA) 108 (90 Base) MCG/ACT inhaler Inhale 1-2 puffs into the lungs every 6 (six) hours as needed for wheezing or shortness of breath. 11/24/22   Laurey Morale, MD  allopurinol (ZYLOPRIM) 100 MG tablet Take 100 mg by mouth daily.    [provider]  aspirin EC 81 MG tablet Take 81 mg by mouth daily. Swallow whole.    [provider]  blood glucose meter kit and supplies KIT Dispense based on patient and insurance preference. Use up to four times daily as directed. 12/25/20   Azucena Fallen, MD  budesonide (PULMICORT) 0.25 MG/2ML nebulizer solution Inhale 2 mLs (0.25 mg total) by nebulization 2 (two) times daily. 11/24/22   Laurey Morale, MD  busPIRone (BUSPAR) 5 MG tablet Take 1 tablet (5 mg total) by mouth 3 (three) times daily. 02/07/23   Avanell Shackleton, NP-C  butalbital-acetaminophen-caffeine (FIORICET) [phone] MG tablet Take 1-2 tablets by mouth every 6 (six) hours as needed for headache. 05/08/23  05/07/24  [name], PA-C  calcitRIOL (ROCALTROL) 0.5 MCG capsule Take 1 capsule (0.5 mcg total) by mouth daily. 09/26/16   Hongalgi, Maximino Greenland, MD  carvedilol (COREG) 6.25 MG tablet Take 1 tablet (6.25 mg total) by mouth 2 (two) times daily. 05/23/23   Milford, Anderson Malta, FNP  clonazePAM (KLONOPIN) 2 MG tablet Take 1 tablet (2 mg total) by mouth 3 (three) times daily. 11/24/22   Laurey Morale, MD  cyclobenzaprine (FLEXERIL) 10 MG tablet Take 10 mg by mouth 3 (three) times daily as needed for muscle spasms. 11/19/19   [provider]  EPINEPHrine 0.3 mg/0.3 mL IJ SOAJ injection Inject 0.3 mg into the muscle once as needed for anaphylaxis.    [provider]  FARXIGA 10 MG TABS tablet Take 1 tablet (10 mg total) by mouth daily before breakfast. 02/02/23   Laurey Morale, MD  fenofibrate (TRICOR) 145 MG tablet Take 1 tablet (145 mg total) by mouth daily. 02/02/23   Laurey Morale, MD  FEROSUL 325 (65 Fe) MG tablet Take 1 tablet (325 mg total) by mouth daily. 02/09/23   Henson, Vickie L, NP-C  haloperidol (HALDOL) 5 MG tablet Take 1 tablet (5 mg total) by mouth at bedtime. 12/16/22 05/23/23  Willeen Niece, MD  levocetirizine (XYZAL) 5 MG tablet Take 1 tablet (5 mg total) by mouth at bedtime. 11/24/22   Laurey Morale, MD  levothyroxine (SYNTHROID) 88 MCG tablet Take  1 tablet (88 mcg total) by mouth daily. 02/09/23   Henson, Vickie L, NP-C  lidocaine (LIDODERM) 5 % Place 1 patch onto the skin daily. Remove & Discard patch within 12 hours or as directed by MD 10/12/22   Blue, Soijett A, PA-C  loperamide (IMODIUM) 2 MG capsule Take 1 capsule (2 mg total) by mouth 4 (four) times daily as needed for diarrhea or loose stools. 11/24/22   Laurey Morale, MD  losartan (COZAAR) 25 MG tablet Take 1 tablet (25 mg total) by mouth daily. 02/02/23   Laurey Morale, MD  nitroGLYCERIN (NITROSTAT) 0.4 MG SL tablet Place 1 tablet (0.4 mg total) under the tongue every 5 (five) minutes as needed for  chest pain. 05/23/23   Milford, Anderson Malta, FNP  nystatin cream (MYCOSTATIN) Apply 1 Application topically 2 (two) times daily as needed for dry skin. 09/26/22   [provider]  omeprazole (PRILOSEC) 20 MG capsule Take 1 capsule (20 mg total) by mouth 2 (two) times daily as needed (acid reflux). 11/24/22   Laurey Morale, MD  ondansetron (ZOFRAN) 4 MG tablet Take 4 mg by mouth every 8 (eight) hours as needed for nausea or vomiting. Patient not taking: Reported on 02/02/2023 01/16/22   [provider]  OXYGEN Inhale 3 L into the lungs continuous.    [provider]  polyethylene glycol (MIRALAX / GLYCOLAX) 17 g packet Take 17 g by mouth daily as needed for mild constipation. Patient not taking: Reported on 05/23/2023 11/24/22   Laurey Morale, MD  potassium chloride (KLOR-CON) 10 MEQ tablet TAKE 1 TABLET (10 MEQ TOTAL) BY MOUTH 2 (TWO) TIMES DAILY. 05/10/23   Laurey Morale, MD  predniSONE (DELTASONE) 20 MG tablet Take 2 tablets (40 mg total) by mouth daily with breakfast. For the next four days 05/13/23   Gerhard Munch, MD  predniSONE (STERAPRED UNI-PAK 21 TAB) 10 MG (21) TBPK tablet TAKE AS DIRECTED FOR 6 DAYS 06/04/23   Cristie Hem, PA-C  rosuvastatin (CRESTOR) 10 MG tablet Take 1 tablet (10 mg total) by mouth every evening. 02/02/23   Laurey Morale, MD  senna-docusate (SENOKOT-S) 8.6-50 MG tablet Take 1 tablet by mouth daily. 02/07/23   Henson, Vickie L, NP-C  spironolactone (ALDACTONE) 25 MG tablet Take 1 tablet (25 mg total) by mouth daily. 02/02/23   Laurey Morale, MD  SUBOXONE 4-1 MG FILM Place 0.5-1 Film under the tongue 3 (three) times daily as needed (Withdrawal). 10/16/22   [provider]  torsemide (DEMADEX) 20 MG tablet Take 2 tablets (40 mg total) by mouth 2 (two) times daily. 11/24/22   Laurey Morale, MD  traZODone (DESYREL) 50 MG tablet Take 2 tablets (100 mg total) by mouth at bedtime. 02/07/23   Henson, Vickie L, NP-C   umeclidinium-vilanterol (ANORO ELLIPTA) 62.5-25 MCG/ACT AEPB Inhale 1 puff into the lungs daily. 02/09/23   Henson, Vickie L, NP-C      Allergies    Bee venom, Fioricet [butalbital-apap-caffeine], Ibuprofen, Lamisil [terbinafine], and Nsaids    Review of Systems   Review of Systems  Physical Exam Updated Vital Signs BP (!) 139/58 (BP Location: Right Arm)   Pulse 68   Temp 97.7 F (36.5 C) (Oral)   Resp 17   Ht 5\' 5"  (1.651 m)   Wt (!) 142 kg   SpO2 97%   BMI 52.09 kg/m  Physical Exam Vitals and nursing note reviewed.  Constitutional:      General: She is  not in acute distress.    Appearance: She is well-developed. She is obese. She is not ill-appearing or diaphoretic.  HENT:     Head: Normocephalic and atraumatic.  Eyes:     Conjunctiva/sclera: Conjunctivae normal.  Cardiovascular:     Rate and Rhythm: Normal rate and regular rhythm.  Pulmonary:     Effort: Pulmonary effort is normal. No respiratory distress.     Breath sounds: Normal breath sounds. No stridor.  Abdominal:     General: There is no distension.  Musculoskeletal:     Comments: No obvious deformity of left knee, patient describes pain  Skin:    General: Skin is warm and dry.  Neurological:     Mental Status: She is alert and oriented to person, place, and time.     Cranial Nerves: No cranial nerve deficit.  Psychiatric:        Mood and Affect: Mood normal.     ED Results / Procedures / Treatments   Labs (all labs ordered are listed, but only abnormal results are displayed) Labs Reviewed  BASIC METABOLIC PANEL  CBC  CBG MONITORING, ED  TROPONIN I (HIGH SENSITIVITY)  TROPONIN I (HIGH SENSITIVITY)    EKG EKG Interpretation Date/Time:  Monday June 04 2023 13:13:59 EST Ventricular Rate:  70 PR Interval:  166 QRS Duration:  68 QT Interval:  398 QTC Calculation: 429 R Axis:   59  Text Interpretation: Normal sinus rhythm Possible Anterior infarct , age undetermined Abnormal ECG When  compared with ECG of 23-May-2023 14:56, PREVIOUS ECG IS PRESENT Confirmed by Lorre Nick ([id number]) on 06/04/2023 1:46:24 PM  Radiology No results found.  Procedures Procedures    Medications Ordered in ED Medications  oxyCODONE-acetaminophen (PERCOCET/ROXICET) 5-325 MG per tablet 2 tablet (2 tablets Oral Given 06/04/23 1408)    ED Course/ Medical Decision Making/ A&P                                 Medical Decision Making Adult female presents with chest pain, leg pain.  Suspicion for acute on chronic left knee pain, but the patient's description of chest pain given her age, obesity, risk profile is concerning.  Patient had orders for labs, x-ray, EKG.  Patient declined labs, and after x-ray was performed, EKG performed, both of which were consistent with prior, patient declined additional lab draw, requested discharge.  Given her stable vital signs, nonischemic EKG, x-ray consistent with prior, absence of distress, patient's request for discharge was accommodated.  Amount and/or Complexity of Data Reviewed External Data Reviewed: notes.    Details: I reviewed the patient's prior ED notes including my note from 2 months ago Labs: ordered. Radiology: ordered and independent interpretation performed. Decision-making details documented in ED Course. ECG/medicine tests: ordered and independent interpretation performed. Decision-making details documented in ED Course.  Risk Decision regarding hospitalization.  Final Clinical Impression(s) / ED Diagnoses Final diagnoses:  Atypical chest pain  Chronic pain of left knee     Gerhard Munch, MD 06/04/23 2010

## 2023-06-04 NOTE — ED Notes (Signed)
 Pt educated on importance of ordered imaging and lab work in relation to chief complaint. Pt verbalized understanding is agreeable to interventions.

## 2023-06-05 ENCOUNTER — Telehealth (HOSPITAL_COMMUNITY): Payer: Self-pay | Admitting: *Deleted

## 2023-06-05 NOTE — Telephone Encounter (Signed)
 Called patient per Prince Rome, NP with following:   "TSH elevated (but improved from previous). Needs to be consistent with levothyroxine, also needs to follow up with PCP. WBC elevated but she is on prednisone, again needs to follow up with PCP.  Renal function and BNP stable."  Pt verbalized above.

## 2023-06-05 NOTE — ED Notes (Signed)
 Discharge instructions reviewed with patient. Pt verbalized understanding. Pt leaving with [facility], in no acute distress at this time.

## 2023-06-06 ENCOUNTER — Emergency Department (HOSPITAL_COMMUNITY): Payer: Medicaid Other

## 2023-06-06 ENCOUNTER — Other Ambulatory Visit: Payer: Self-pay

## 2023-06-06 ENCOUNTER — Observation Stay (HOSPITAL_COMMUNITY)
Admission: EM | Admit: 2023-06-06 | Discharge: 2023-06-07 | Disposition: A | Discharge status: Home / Self Care | Payer: Medicaid Other | Attending: Family Medicine | Admitting: Family Medicine

## 2023-06-06 ENCOUNTER — Encounter (HOSPITAL_COMMUNITY): Payer: Self-pay | Admitting: Emergency Medicine

## 2023-06-06 DIAGNOSIS — Z7982 Long term (current) use of aspirin: Secondary | ICD-10-CM | POA: Insufficient documentation

## 2023-06-06 DIAGNOSIS — Z6841 Body Mass Index (BMI) 40.0 and over, adult: Secondary | ICD-10-CM | POA: Insufficient documentation

## 2023-06-06 DIAGNOSIS — D509 Iron deficiency anemia, unspecified: Secondary | ICD-10-CM | POA: Diagnosis not present

## 2023-06-06 DIAGNOSIS — M1000 Idiopathic gout, unspecified site: Secondary | ICD-10-CM

## 2023-06-06 DIAGNOSIS — Z8709 Personal history of other diseases of the respiratory system: Secondary | ICD-10-CM

## 2023-06-06 DIAGNOSIS — Z87891 Personal history of nicotine dependence: Secondary | ICD-10-CM | POA: Diagnosis not present

## 2023-06-06 DIAGNOSIS — E785 Hyperlipidemia, unspecified: Secondary | ICD-10-CM | POA: Diagnosis not present

## 2023-06-06 DIAGNOSIS — J449 Chronic obstructive pulmonary disease, unspecified: Secondary | ICD-10-CM | POA: Insufficient documentation

## 2023-06-06 DIAGNOSIS — I5032 Chronic diastolic (congestive) heart failure: Secondary | ICD-10-CM | POA: Diagnosis not present

## 2023-06-06 DIAGNOSIS — R0789 Other chest pain: Secondary | ICD-10-CM | POA: Diagnosis not present

## 2023-06-06 DIAGNOSIS — Z79899 Other long term (current) drug therapy: Secondary | ICD-10-CM | POA: Insufficient documentation

## 2023-06-06 DIAGNOSIS — Z86718 Personal history of other venous thrombosis and embolism: Secondary | ICD-10-CM | POA: Diagnosis not present

## 2023-06-06 DIAGNOSIS — J9611 Chronic respiratory failure with hypoxia: Secondary | ICD-10-CM | POA: Diagnosis not present

## 2023-06-06 DIAGNOSIS — R079 Chest pain, unspecified: Secondary | ICD-10-CM | POA: Diagnosis not present

## 2023-06-06 DIAGNOSIS — N182 Chronic kidney disease, stage 2 (mild): Secondary | ICD-10-CM | POA: Insufficient documentation

## 2023-06-06 DIAGNOSIS — I130 Hypertensive heart and chronic kidney disease with heart failure and stage 1 through stage 4 chronic kidney disease, or unspecified chronic kidney disease: Secondary | ICD-10-CM | POA: Diagnosis not present

## 2023-06-06 DIAGNOSIS — E039 Hypothyroidism, unspecified: Secondary | ICD-10-CM | POA: Diagnosis not present

## 2023-06-06 DIAGNOSIS — F319 Bipolar disorder, unspecified: Secondary | ICD-10-CM | POA: Insufficient documentation

## 2023-06-06 DIAGNOSIS — M109 Gout, unspecified: Secondary | ICD-10-CM | POA: Diagnosis present

## 2023-06-06 DIAGNOSIS — N179 Acute kidney failure, unspecified: Secondary | ICD-10-CM | POA: Diagnosis not present

## 2023-06-06 DIAGNOSIS — E1122 Type 2 diabetes mellitus with diabetic chronic kidney disease: Secondary | ICD-10-CM | POA: Insufficient documentation

## 2023-06-06 DIAGNOSIS — Z1152 Encounter for screening for COVID-19: Secondary | ICD-10-CM | POA: Diagnosis not present

## 2023-06-06 DIAGNOSIS — I10 Essential (primary) hypertension: Secondary | ICD-10-CM | POA: Diagnosis present

## 2023-06-06 DIAGNOSIS — R109 Unspecified abdominal pain: Secondary | ICD-10-CM | POA: Diagnosis present

## 2023-06-06 DIAGNOSIS — Z7984 Long term (current) use of oral hypoglycemic drugs: Secondary | ICD-10-CM | POA: Diagnosis not present

## 2023-06-06 DIAGNOSIS — E038 Other specified hypothyroidism: Secondary | ICD-10-CM

## 2023-06-06 DIAGNOSIS — E119 Type 2 diabetes mellitus without complications: Secondary | ICD-10-CM

## 2023-06-06 DIAGNOSIS — N189 Chronic kidney disease, unspecified: Secondary | ICD-10-CM | POA: Diagnosis present

## 2023-06-06 DIAGNOSIS — G4733 Obstructive sleep apnea (adult) (pediatric): Secondary | ICD-10-CM | POA: Diagnosis present

## 2023-06-06 DIAGNOSIS — Z85850 Personal history of malignant neoplasm of thyroid: Secondary | ICD-10-CM | POA: Insufficient documentation

## 2023-06-06 LAB — CBC WITH DIFFERENTIAL/PLATELET
Abs Immature Granulocytes: 0.8 10*3/uL — ABNORMAL HIGH (ref 0.00–0.07)
Basophils Absolute: 0.3 10*3/uL — ABNORMAL HIGH (ref 0.0–0.1)
Basophils Relative: 1 %
Eosinophils Absolute: 0.3 10*3/uL (ref 0.0–0.5)
Eosinophils Relative: 1 %
HCT: 41.2 % (ref 36.0–46.0)
Hemoglobin: 13.1 g/dL (ref 12.0–15.0)
Lymphocytes Relative: 18 %
Lymphs Abs: 4.5 10*3/uL — ABNORMAL HIGH (ref 0.7–4.0)
MCH: 30.7 pg (ref 26.0–34.0)
MCHC: 31.8 g/dL (ref 30.0–36.0)
MCV: 96.5 fL (ref 80.0–100.0)
Monocytes Absolute: 0.8 10*3/uL (ref 0.1–1.0)
Monocytes Relative: 3 %
Myelocytes: 2 %
Neutro Abs: 18.6 10*3/uL — ABNORMAL HIGH (ref 1.7–7.7)
Neutrophils Relative %: 74 %
Platelets: 306 10*3/uL (ref 150–400)
Promyelocytes Relative: 1 %
RBC: 4.27 MIL/uL (ref 3.87–5.11)
RDW: 13.4 % (ref 11.5–15.5)
WBC: 25.1 10*3/uL — ABNORMAL HIGH (ref 4.0–10.5)
nRBC: 0 % (ref 0.0–0.2)
nRBC: 0 /100{WBCs}

## 2023-06-06 LAB — COMPREHENSIVE METABOLIC PANEL
ALT: 24 U/L (ref 0–44)
AST: 19 U/L (ref 15–41)
Albumin: 3.7 g/dL (ref 3.5–5.0)
Alkaline Phosphatase: 56 U/L (ref 38–126)
Anion gap: 14 (ref 5–15)
BUN: 40 mg/dL — ABNORMAL HIGH (ref 8–23)
CO2: 32 mmol/L (ref 22–32)
Calcium: 7.7 mg/dL — ABNORMAL LOW (ref 8.9–10.3)
Chloride: 94 mmol/L — ABNORMAL LOW (ref 98–111)
Creatinine, Ser: 1.74 mg/dL — ABNORMAL HIGH (ref 0.44–1.00)
GFR, Estimated: 33 mL/min — ABNORMAL LOW (ref >60)
Glucose, Bld: 131 mg/dL — ABNORMAL HIGH (ref 70–99)
Potassium: 3.8 mmol/L (ref 3.5–5.1)
Sodium: 140 mmol/L (ref 135–145)
Total Bilirubin: 0.2 mg/dL (ref 0.0–1.2)
Total Protein: 6.6 g/dL (ref 6.5–8.1)

## 2023-06-06 LAB — URINALYSIS, ROUTINE W REFLEX MICROSCOPIC
Bacteria, UA: NONE SEEN
Bilirubin Urine: NEGATIVE
Glucose, UA: >=500 mg/dL — AB
Hgb urine dipstick: NEGATIVE
Ketones, ur: NEGATIVE mg/dL
Leukocytes,Ua: NEGATIVE
Nitrite: NEGATIVE
Protein, ur: NEGATIVE mg/dL
Specific Gravity, Urine: 1.013 (ref 1.005–1.030)
pH: 7 (ref 5.0–8.0)

## 2023-06-06 LAB — RESP PANEL BY RT-PCR (RSV, FLU A&B, COVID)  RVPGX2
Influenza A by PCR: NEGATIVE
Influenza B by PCR: NEGATIVE
Resp Syncytial Virus by PCR: NEGATIVE
SARS Coronavirus 2 by RT PCR: NEGATIVE

## 2023-06-06 LAB — LIPASE, BLOOD: Lipase: 51 U/L (ref 11–51)

## 2023-06-06 LAB — CBG MONITORING, ED: Glucose-Capillary: 108 mg/dL — ABNORMAL HIGH (ref 70–99)

## 2023-06-06 MED ORDER — ASPIRIN 81 MG PO TBEC: 81.0000 mg | DELAYED_RELEASE_TABLET | Freq: Every day | ORAL | Status: DC

## 2023-06-06 MED ORDER — FERROUS SULFATE 325 (65 FE) MG PO TABS
325.0000 mg | ORAL_TABLET | Freq: Every day | ORAL | Status: DC
  Administered 2023-06-07: 325 mg via ORAL
  Filled 2023-06-06: qty 1

## 2023-06-06 MED ORDER — ALBUTEROL SULFATE (2.5 MG/3ML) 0.083% IN NEBU: 2.5000 mg | INHALATION_SOLUTION | Freq: Four times a day (QID) | RESPIRATORY_TRACT | Status: DC | PRN

## 2023-06-06 MED ORDER — BUSPIRONE HCL 10 MG PO TABS: 5.0000 mg | ORAL_TABLET | Freq: Three times a day (TID) | ORAL | Status: DC

## 2023-06-06 MED ORDER — INSULIN ASPART 100 UNIT/ML IJ SOLN
0.0000 [IU] | Freq: Three times a day (TID) | INTRAMUSCULAR | Status: DC
  Administered 2023-06-07: 1 [IU] via SUBCUTANEOUS

## 2023-06-06 MED ORDER — HEPARIN SODIUM (PORCINE) 5000 UNIT/ML IJ SOLN: 5000.0000 [IU] | Freq: Three times a day (TID) | INTRAMUSCULAR | Status: DC

## 2023-06-06 MED ORDER — LEVOTHYROXINE SODIUM 88 MCG PO TABS: 88.0000 ug | ORAL_TABLET | Freq: Every day | ORAL | Status: DC

## 2023-06-06 MED ORDER — SODIUM CHLORIDE 0.9% FLUSH: 3.0000 mL | Freq: Two times a day (BID) | INTRAVENOUS | Status: DC

## 2023-06-06 MED ORDER — INSULIN ASPART 100 UNIT/ML IJ SOLN: 0.0000 [IU] | Freq: Every day | INTRAMUSCULAR | Status: DC

## 2023-06-06 MED ORDER — SODIUM CHLORIDE 0.9 % IV SOLN: 250.0000 mL | INTRAVENOUS | Status: DC | PRN

## 2023-06-06 MED ORDER — SODIUM CHLORIDE 0.9 % IV BOLUS
1000.0000 mL | Freq: Once | INTRAVENOUS | Status: AC
  Administered 2023-06-06: 1000 mL via INTRAVENOUS

## 2023-06-06 MED ORDER — SODIUM CHLORIDE 0.9 % IV SOLN: INTRAVENOUS | Status: DC

## 2023-06-06 MED ORDER — CARVEDILOL 3.125 MG PO TABS
6.2500 mg | ORAL_TABLET | Freq: Two times a day (BID) | ORAL | Status: DC
  Administered 2023-06-07: 6.25 mg via ORAL
  Filled 2023-06-06: qty 2

## 2023-06-06 MED ORDER — SODIUM CHLORIDE 0.9% FLUSH
3.0000 mL | Freq: Two times a day (BID) | INTRAVENOUS | Status: DC
  Administered 2023-06-07: 3 mL via INTRAVENOUS

## 2023-06-06 MED ORDER — ONDANSETRON HCL 4 MG PO TABS: 4.0000 mg | ORAL_TABLET | Freq: Four times a day (QID) | ORAL | Status: DC | PRN

## 2023-06-06 MED ORDER — BUDESONIDE 0.25 MG/2ML IN SUSP: 0.2500 mg | Freq: Two times a day (BID) | RESPIRATORY_TRACT | Status: DC

## 2023-06-06 MED ORDER — ONDANSETRON 4 MG PO TBDP
4.0000 mg | ORAL_TABLET | Freq: Once | ORAL | Status: AC
  Administered 2023-06-06: 4 mg via ORAL
  Filled 2023-06-06: qty 1

## 2023-06-06 MED ORDER — IPRATROPIUM-ALBUTEROL 0.5-2.5 (3) MG/3ML IN SOLN: 3.0000 mL | RESPIRATORY_TRACT | Status: DC | PRN

## 2023-06-06 MED ORDER — PANTOPRAZOLE SODIUM 40 MG PO TBEC: 40.0000 mg | DELAYED_RELEASE_TABLET | Freq: Every day | ORAL | Status: DC

## 2023-06-06 MED ORDER — ACETAMINOPHEN 500 MG PO TABS: 1000.0000 mg | ORAL_TABLET | Freq: Four times a day (QID) | ORAL | Status: DC | PRN

## 2023-06-06 MED ORDER — ONDANSETRON HCL 4 MG/2ML IJ SOLN: 4.0000 mg | Freq: Four times a day (QID) | INTRAMUSCULAR | Status: DC | PRN

## 2023-06-06 MED ORDER — FENOFIBRATE 54 MG PO TABS
54.0000 mg | ORAL_TABLET | Freq: Every day | ORAL | Status: DC
  Filled 2023-06-06: qty 1

## 2023-06-06 MED ORDER — ALPRAZOLAM 0.25 MG PO TABS
0.2500 mg | ORAL_TABLET | Freq: Once | ORAL | Status: AC
  Administered 2023-06-06: 0.25 mg via ORAL
  Filled 2023-06-06: qty 1

## 2023-06-06 MED ORDER — ROSUVASTATIN CALCIUM 5 MG PO TABS: 10.0000 mg | ORAL_TABLET | Freq: Every evening | ORAL | Status: DC

## 2023-06-06 MED ORDER — SODIUM CHLORIDE 0.9% FLUSH: 3.0000 mL | INTRAVENOUS | Status: DC | PRN

## 2023-06-06 MED ORDER — ALLOPURINOL 100 MG PO TABS: 100.0000 mg | ORAL_TABLET | Freq: Every day | ORAL | Status: DC

## 2023-06-06 MED ORDER — UMECLIDINIUM-VILANTEROL 62.5-25 MCG/ACT IN AEPB
1.0000 | INHALATION_SPRAY | Freq: Every day | RESPIRATORY_TRACT | Status: DC
  Filled 2023-06-06: qty 14

## 2023-06-06 MED ORDER — TRAZODONE HCL 50 MG PO TABS: 100.0000 mg | ORAL_TABLET | Freq: Every day | ORAL | Status: DC

## 2023-06-06 NOTE — ED Provider Triage Note (Signed)
 Emergency Medicine Provider Triage Evaluation Note  Michele Owens , a 63 y.o. female  was evaluated in triage.  Pt complains of chronic abdominal pain with nausea began this morning.  Patient discussed that she was here a few days ago and was upset that she was in the hallway and states that she left AMA she does not want to be in the hallway.  Patient unfortunately did not give much history on her abdominal pain outside of that it is generalized that she has had some nausea after having a sausage biscuit this morning.  Patient denied fevers with me but otherwise did not give further history.  Review of Systems  Positive:  Negative:   Physical Owens  BP 110/81 (BP Location: Right Arm)   Pulse 89   Temp 98.8 F (37.1 C) (Oral)   Resp 20   Wt (!) 141 kg   SpO2 98%   BMI 51.73 kg/m  Gen:   Awake, no distress   Resp:  Normal effort, 2 L nasal cannula which is her baseline MSK:   Moves extremities without difficulty  Other:  No abdominal tenderness or peritoneal signs noted  Medical Decision Making  Michele Owens initiated at 9:31 AM.  Appropriate orders placed.  Michele Owens was informed that the remainder of the evaluation will be completed by another provider, this initial triage assessment does not replace that evaluation, and the importance of remaining in the ED until their evaluation is complete.  Workup initiated, labs ordered along with Story panel, patient unfortunately did not give further history on her abdominal pain is she focused on her visit from the other day, patient stable for the lobby.   Michele Corrigan, PA-C 06/06/23 [id number]

## 2023-06-06 NOTE — H&P (Signed)
 History and Physical    Michele Owens RUE:[id number] DOB: Dec 22, 1960 DOA: 06/06/2023  PCP: [facility]   Patient coming from: Home   Chief Complaint:  Chief Complaint  Patient presents with   Abdominal Pain   ED TRIAGE note:Pt presents from home for abd pain (generalized, 10/10), nausea without emesis, startingat 3am today. Denies diarrhea, dysuria Baseline 2 L [place]   EMS VS: 140/84, 84bpm, 97% 2L, 206 CBG   Last oral intake this AM (sausage biscuit) H/o DM II  HPI:  Michele Owens is a 63 y.o. female with medical history significant of COPD, chronic hypoxic respiratory failure 2 L oxygen, essential hypertension, morbid obesity, obstructive sleep apnea, hyperlipidemia, chronic diastolic CHF, gout, hypothyroidism, bipolar disorder, SVT and non-insulin-dependent DM type II presented to emergency department with complaining of nausea, generalized abdominal pain.  Patient is complaining of nonspecific chest pain but which has been resolved since patient in the ED. During my evaluation at the bedside patient is complaining about nausea and had not eaten anything since this morning. Patient she is also complaining about 10 out of 10 left-sided chest pain which is constant in nature.  Patient is complaining chest pain at the Holter monitor side. Patient denies any shortness of breath, palpitation, headache, vomiting, constipation diarrhea.  She is complaining of generalized abdominal pain.   ED Course:  Presentation to ED patient is hemodynamically stable. CBC blood glucose 108. UA unremarkable. Pending troponin level. CBC showing leukocytosis 25.1 (per chart review patient has chronically elevated WBC count around 16-23). CMP showing elevated creatinine 1.7, elevated BUN 14, low calcium 7.7. Normal lipase level 51.  Chest x-ray no acute disease process.  It showed enlarged heart with prominence of the pulmonary vasculature.  Bibasilar bandlike opacities secondary to  atelectasis versus infiltrate.  CT renal stone study no acute abdominal and pelvis finding.  Aortic atherosclerosis.  Bibasilar atelectasis. In the ED patient has been given 1 L of NS bolus, Zofran and Xanax.  Hospitalist has been contacted for further evaluation management of AKI and chest pain.  Significant labs in the ED: Lab Orders         Resp panel by RT-PCR (RSV, Flu A&B, Covid) Anterior Nasal Swab         Lipase, blood         Comprehensive metabolic panel         Urinalysis, Routine w reflex microscopic -Urine, Clean Catch         CBC with Differential         Comprehensive metabolic panel         CBC         Creatinine, urine, random         Sodium, urine, random         Urinalysis, Routine w reflex microscopic -Urine, Clean Catch         CBG monitoring, ED       Review of Systems:  Review of Systems  Constitutional:  Negative for chills, fever, malaise/fatigue and weight loss.  Respiratory:  Negative for cough, sputum production and shortness of breath.   Cardiovascular:  Positive for chest pain. Negative for palpitations, orthopnea, claudication and leg swelling.  Gastrointestinal:  Positive for abdominal pain. Negative for constipation, diarrhea, heartburn, nausea and vomiting.  Genitourinary:  Negative for dysuria, flank pain, frequency and urgency.  Musculoskeletal:  Negative for back pain, falls, joint pain, myalgias and neck pain.  Neurological:  Negative for dizziness and headaches.  Psychiatric/Behavioral:  Negative for depression. The patient is nervous/anxious.   All other systems reviewed and are negative.   Past Medical History:  Diagnosis Date   Anxiety    Arthritis    Asthma    Chronic diastolic CHF (congestive heart failure) (HCC)    COPD (chronic obstructive pulmonary disease) (HCC)    Uses Oxygen at night   Depression    Diabetes mellitus without complication (HCC)    GERD (gastroesophageal reflux disease)    Gout    Headache    migraines    History of DVT of lower extremity    Hypertension    Morbid obesity with BMI of 45.0-49.9, adult (HCC)    Thyroid cancer (HCC) 09/23/2016    Past Surgical History:  Procedure Laterality Date   BUNIONECTOMY Bilateral    CARDIAC CATHETERIZATION N/A 06/23/2015   Procedure: Right/Left Heart Cath and Coronary Angiography;  Surgeon: Laurey Morale, MD;  Location: [facility] INVASIVE CV LAB;  Service: Cardiovascular;  Laterality: N/A;   COLONOSCOPY WITH PROPOFOL N/A 12/15/2015   Procedure: COLONOSCOPY WITH PROPOFOL;  Surgeon: Dorena Cookey, MD;  Location: [facility] ENDOSCOPY;  Service: Endoscopy;  Laterality: N/A;   RIGHT HEART CATH N/A 10/01/2019   Procedure: RIGHT HEART CATH;  Surgeon: Laurey Morale, MD;  Location: [facility] INVASIVE CV LAB;  Service: Cardiovascular;  Laterality: N/A;   THYROIDECTOMY  09/19/2016   THYROIDECTOMY N/A 09/19/2016   Procedure: TOTAL THYROIDECTOMY;  Surgeon: Darnell Level, MD;  Location: [facility] OR;  Service: General;  Laterality: N/A;   TONSILLECTOMY     TOTAL ABDOMINAL HYSTERECTOMY  07/14/10     reports that she quit smoking about 6 years ago. Her smoking use included cigarettes. She started smoking about 47 years ago. She has a 20.5 pack-year smoking history. She has never used smokeless tobacco. She reports that she does not currently use alcohol. She reports that she does not currently use drugs after having used the following drugs: Marijuana and Cocaine.  Allergies  Allergen Reactions   Bee Venom Anaphylaxis    "All over my body" (swelling)   Fioricet [Butalbital-Apap-Caffeine] Nausea And Vomiting and Rash   Ibuprofen Rash and Other (See Comments)    Severe rash   Lamisil [Terbinafine] Rash and Other (See Comments)    Pt states this causes her to "feel funny"   Nsaids Other (See Comments)    Per MD's orders     Family History  Problem Relation Age of Onset   Cancer Father        thought to be due to exposure to concrete   Diabetes Mother    Thyroid cancer Mother        dx in  her 19s-60s   Cancer Maternal Uncle        2 uncles with cancer NOS   Brain cancer Paternal Aunt    Cancer Cousin        maternal first cousin - NOS   Cancer Cousin        maternal first cousin - NOS    Prior to Admission medications   Medication Sig Start Date End Date Taking? Authorizing Provider  acetaminophen (TYLENOL) 500 MG tablet Take 1,000 mg by mouth every 6 (six) hours as needed for moderate pain (pain score 4-6) or headache.    [provider]  albuterol (VENTOLIN HFA) 108 (90 Base) MCG/ACT inhaler Inhale 1-2 puffs into the lungs every 6 (six) hours as needed for wheezing or shortness of breath. 11/24/22   Laurey Morale, MD  allopurinol (ZYLOPRIM) 100 MG tablet Take 100 mg by mouth daily.    [provider]  aspirin EC 81 MG tablet Take 81 mg by mouth daily. Swallow whole.    [provider]  blood glucose meter kit and supplies KIT Dispense based on patient and insurance preference. Use up to four times daily as directed. 12/25/20   Azucena Fallen, MD  budesonide (PULMICORT) 0.25 MG/2ML nebulizer solution Inhale 2 mLs (0.25 mg total) by nebulization 2 (two) times daily. 11/24/22   Laurey Morale, MD  busPIRone (BUSPAR) 5 MG tablet Take 1 tablet (5 mg total) by mouth 3 (three) times daily. 02/07/23   Avanell Shackleton, NP-C  butalbital-acetaminophen-caffeine (FIORICET) [id number] MG tablet Take 1-2 tablets by mouth every 6 (six) hours as needed for headache. 05/08/23 05/07/24  [name], PA-C  calcitRIOL (ROCALTROL) 0.5 MCG capsule Take 1 capsule (0.5 mcg total) by mouth daily. 09/26/16   Hongalgi, Maximino Greenland, MD  carvedilol (COREG) 6.25 MG tablet Take 1 tablet (6.25 mg total) by mouth 2 (two) times daily. 05/23/23   Milford, Anderson Malta, FNP  clonazePAM (KLONOPIN) 2 MG tablet Take 1 tablet (2 mg total) by mouth 3 (three) times daily. 11/24/22   Laurey Morale, MD  cyclobenzaprine (FLEXERIL) 10 MG tablet Take 10 mg by mouth 3 (three) times daily as  needed for muscle spasms. 11/19/19   [provider]  EPINEPHrine 0.3 mg/0.3 mL IJ SOAJ injection Inject 0.3 mg into the muscle once as needed for anaphylaxis.    [provider]  FARXIGA 10 MG TABS tablet Take 1 tablet (10 mg total) by mouth daily before breakfast. 02/02/23   Laurey Morale, MD  fenofibrate (TRICOR) 145 MG tablet Take 1 tablet (145 mg total) by mouth daily. 02/02/23   Laurey Morale, MD  FEROSUL 325 (65 Fe) MG tablet Take 1 tablet (325 mg total) by mouth daily. 02/09/23   Henson, Vickie L, NP-C  haloperidol (HALDOL) 5 MG tablet Take 1 tablet (5 mg total) by mouth at bedtime. 12/16/22 05/23/23  Willeen Niece, MD  levocetirizine (XYZAL) 5 MG tablet Take 1 tablet (5 mg total) by mouth at bedtime. 11/24/22   Laurey Morale, MD  levothyroxine (SYNTHROID) 88 MCG tablet Take 1 tablet (88 mcg total) by mouth daily. 02/09/23   Henson, Vickie L, NP-C  lidocaine (LIDODERM) 5 % Place 1 patch onto the skin daily. Remove & Discard patch within 12 hours or as directed by MD 10/12/22   Blue, Soijett A, PA-C  loperamide (IMODIUM) 2 MG capsule Take 1 capsule (2 mg total) by mouth 4 (four) times daily as needed for diarrhea or loose stools. 11/24/22   Laurey Morale, MD  losartan (COZAAR) 25 MG tablet Take 1 tablet (25 mg total) by mouth daily. 02/02/23   Laurey Morale, MD  nitroGLYCERIN (NITROSTAT) 0.4 MG SL tablet Place 1 tablet (0.4 mg total) under the tongue every 5 (five) minutes as needed for chest pain. 05/23/23   Milford, Anderson Malta, FNP  nystatin cream (MYCOSTATIN) Apply 1 Application topically 2 (two) times daily as needed for dry skin. 09/26/22   [provider]  omeprazole (PRILOSEC) 20 MG capsule Take 1 capsule (20 mg total) by mouth 2 (two) times daily as needed (acid reflux). 11/24/22   Laurey Morale, MD  ondansetron (ZOFRAN) 4 MG tablet Take 4 mg by mouth every 8 (eight) hours as needed for nausea or vomiting. Patient not taking: Reported on 02/02/2023  01/16/22   [provider]  OXYGEN Inhale 3 L into the lungs continuous.    [provider]  polyethylene glycol (MIRALAX / GLYCOLAX) 17 g packet Take 17 g by mouth daily as needed for mild constipation. Patient not taking: Reported on 05/23/2023 11/24/22   Laurey Morale, MD  potassium chloride (KLOR-CON) 10 MEQ tablet TAKE 1 TABLET (10 MEQ TOTAL) BY MOUTH 2 (TWO) TIMES DAILY. 05/10/23   Laurey Morale, MD  predniSONE (DELTASONE) 20 MG tablet Take 2 tablets (40 mg total) by mouth daily with breakfast. For the next four days 05/13/23   Gerhard Munch, MD  predniSONE (STERAPRED UNI-PAK 21 TAB) 10 MG (21) TBPK tablet TAKE AS DIRECTED FOR 6 DAYS 06/04/23   Cristie Hem, PA-C  rosuvastatin (CRESTOR) 10 MG tablet Take 1 tablet (10 mg total) by mouth every evening. 02/02/23   Laurey Morale, MD  senna-docusate (SENOKOT-S) 8.6-50 MG tablet Take 1 tablet by mouth daily. 02/07/23   Henson, Vickie L, NP-C  spironolactone (ALDACTONE) 25 MG tablet Take 1 tablet (25 mg total) by mouth daily. 02/02/23   Laurey Morale, MD  SUBOXONE 4-1 MG FILM Place 0.5-1 Film under the tongue 3 (three) times daily as needed (Withdrawal). 10/16/22   [provider]  torsemide (DEMADEX) 20 MG tablet Take 2 tablets (40 mg total) by mouth 2 (two) times daily. 11/24/22   Laurey Morale, MD  traZODone (DESYREL) 50 MG tablet Take 2 tablets (100 mg total) by mouth at bedtime. 02/07/23   Henson, Vickie L, NP-C  umeclidinium-vilanterol (ANORO ELLIPTA) 62.5-25 MCG/ACT AEPB Inhale 1 puff into the lungs daily. 02/09/23   Avanell Shackleton, NP-C     Physical Exam: Vitals:   06/06/23 0922 06/06/23 0927 06/06/23 1728  BP:  110/81 119/63  Pulse:  89 80  Resp:  20 16  Temp:  98.8 F (37.1 C) 98.4 F (36.9 C)  TempSrc:  Oral   SpO2:  98% 96%  Weight: (!) 141 kg      Physical Exam Vitals and nursing note reviewed.  Constitutional:      Appearance: She is obese. She is not ill-appearing.   Cardiovascular:     Rate and Rhythm: Normal rate and regular rhythm.  Pulmonary:     Effort: Pulmonary effort is normal.     Breath sounds: Normal breath sounds.  Abdominal:     General: Bowel sounds are normal. There is no distension.     Palpations: Abdomen is rigid.     Tenderness: There is no abdominal tenderness. There is no guarding or rebound.     Hernia: There is no hernia in the umbilical area or ventral area.  Skin:    General: Skin is warm and dry.     Capillary Refill: Capillary refill takes less than 2 seconds.  Neurological:     Mental Status: She is alert and oriented to person, place, and time.  Psychiatric:        Mood and Affect: Mood normal. Mood is not anxious or depressed.      Labs on Admission: I have personally reviewed following labs and imaging studies  CBC: Recent Labs  Lab 06/06/23 0935  WBC 25.1*  NEUTROABS 18.6*  HGB 13.1  HCT 41.2  MCV 96.5  PLT 306   Basic Metabolic Panel: Recent Labs  Lab 06/06/23 0935  NA 140  K 3.8  CL 94*  CO2 32  GLUCOSE 131*  BUN 40*  CREATININE 1.74*  CALCIUM 7.7*   GFR: Estimated Creatinine Clearance: 47.3 mL/min (A) (by C-G formula based on SCr of 1.74 mg/dL (H)). Liver Function Tests: Recent Labs  Lab 06/06/23 0935  AST 19  ALT 24  ALKPHOS 56  BILITOT 0.2  PROT 6.6  ALBUMIN 3.7   Recent Labs  Lab 06/06/23 0935  LIPASE 51   No results for input(s): "AMMONIA" in the last 168 hours. Coagulation Profile: No results for input(s): "INR", "PROTIME" in the last 168 hours. Cardiac Enzymes: No results for input(s): "CKTOTAL", "CKMB", "CKMBINDEX", "TROPONINI", "TROPONINIHS" in the last 168 hours. BNP (last 3 results) Recent Labs    11/03/22 1501 12/13/22 2058 05/23/23 1506  BNP 114.8* 41.5 15.3   HbA1C: No results for input(s): "HGBA1C" in the last 72 hours. CBG: Recent Labs  Lab 06/06/23 2235  GLUCAP 108*   Lipid Profile: No results for input(s): "CHOL", "HDL", "LDLCALC", "TRIG",  "CHOLHDL", "LDLDIRECT" in the last 72 hours. Thyroid Function Tests: No results for input(s): "TSH", "T4TOTAL", "FREET4", "T3FREE", "THYROIDAB" in the last 72 hours. Anemia Panel: No results for input(s): "VITAMINB12", "FOLATE", "FERRITIN", "TIBC", "IRON", "RETICCTPCT" in the last 72 hours. Urine analysis:    Component Value Date/Time   COLORURINE STRAW (A) 06/06/2023 2052   APPEARANCEUR CLEAR 06/06/2023 2052   LABSPEC 1.013 06/06/2023 2052   PHURINE 7.0 06/06/2023 2052   GLUCOSEU >=500 (A) 06/06/2023 2052   HGBUR NEGATIVE 06/06/2023 2052   BILIRUBINUR NEGATIVE 06/06/2023 2052   KETONESUR NEGATIVE 06/06/2023 2052   PROTEINUR NEGATIVE 06/06/2023 2052   UROBILINOGEN 0.2 02/28/2012 2004   NITRITE NEGATIVE 06/06/2023 2052   LEUKOCYTESUR NEGATIVE 06/06/2023 2052    Radiological Exams on Admission: I have personally reviewed images CT Renal Stone Study Result Date: 06/06/2023 CLINICAL DATA:  Abdominal pain, flank pain EXAM: CT ABDOMEN AND PELVIS WITHOUT CONTRAST TECHNIQUE: Multidetector CT imaging of the abdomen and pelvis was performed following the standard protocol without IV contrast. RADIATION DOSE REDUCTION: This exam was performed according to the departmental dose-optimization program which includes automated exposure control, adjustment of the mA and/or kV according to patient size and/or use of iterative reconstruction technique. COMPARISON:  03/09/2022 FINDINGS: Lower chest: Linear areas of atelectasis or scarring in the lung bases. No effusions. Hepatobiliary: No focal hepatic abnormality. Gallbladder unremarkable. Pancreas: No focal abnormality or ductal dilatation. Spleen: No focal abnormality.  Normal size. Adrenals/Urinary Tract: No adrenal abnormality. No focal renal abnormality. No stones or hydronephrosis. Urinary bladder is unremarkable. Stomach/Bowel: Normal appendix. Stomach, large and small bowel grossly unremarkable. Vascular/Lymphatic: No evidence of aneurysm or adenopathy.  Scattered aortic atherosclerosis. Reproductive: Prior hysterectomy.  No adnexal masses. Other: No free fluid or free air. Musculoskeletal: No acute bony abnormality. IMPRESSION: No acute findings in the abdomen or pelvis. Scattered aortic atherosclerosis. Bibasilar atelectasis or scarring. Electronically Signed   By: Charlett Nose M.D.   On: 06/06/2023 21:15   DG Chest Portable 1 View Result Date: 06/06/2023 CLINICAL DATA:  Chest pain EXAM: PORTABLE CHEST 1 VIEW COMPARISON:  X-ray 06/04/2023 and older.  CTA 12/13/2022 FINDINGS: Film is under penetrated. There is some linear opacity lung bases likely scar or atelectasis. No pneumothorax, effusion or edema. Enlarged cardiopericardial silhouette. Prominent central vasculature. Fullness seen at the right lung hilum. This is likely related to enlarged pulmonary arteries as seen on CTA of 12/13/2022. surgical clips seen at the thoracic inlet. IMPRESSION: Enlarged heart with prominence of the pulmonary vasculature. Basilar bandlike opacities. Atelectasis favored over infiltrate. Recommend follow-up. Electronically Signed   By: Karen Kays  M.D.   On: 06/06/2023 19:50     EKG: My personal interpretation of EKG shows:  Pending EKG.    Assessment/Plan: Principal Problem:   Acute kidney injury superimposed on chronic kidney disease (HCC) Active Problems:   Chest pain   Chronic hypoxic respiratory failure (HCC)   OSA (obstructive sleep apnea)   Non-insulin dependent type 2 diabetes mellitus (HCC)   Gout   Essential hypertension   Severe obesity (BMI >= 40) (HCC)   Chronic diastolic CHF (congestive heart failure) (HCC)   Hypothyroidism   History of COPD   Bipolar disorder (HCC)    Assessment and Plan: Acute kidney injury CKD stage II -Patient presenting with nausea, generalized abdominal pain and poor appetite.  Reported midepigastric/lower sternal chest pain. -Hemodynamically stable - CBC showing leukocytosis from recent prednisone use from COPD  exacerbation - CMP showing elevated creatinine 1.7 LA elevated BUN 40.  Normal lipase level.  Baseline normal renal function and GFR.  In the ED patient has been given 1 L of NS bolus. - Chest x-ray showing cardiomegaly with pulmonary vascular prominence, bibasilar bandlike opacity. - CT renal study no acute abdominal pelvic finding.  Scattered aortic atherosclerosis.  Bibasilar atelectasis or scarring - Checking UA, urine creatinine urine sodium level - Concern for prerenal acute kidney injury in the setting of poor oral intake from nausea and abdominal pain. -Continue maintenance fluid NS 50 cc/h. - Continue monitor renal function, monitor urine output and avoid nephrotoxic agents.  Chest pain -Patient is complaining about left-sided upper chest wall pain where the the Holter monitor has been placed.   -Chest pain is reproducible on palpation. -Given patient has history of multiple comorbidities checking troponin and obtaining EKG.  If troponin remains flat do not need to trend the troponin.   Grade 1 diastolic heart failure preserved EF 55 to 60% Essential hypertension -Patient is borderline hypotensive.  Holding Farxiga, losartan and Aldactone in the setting of AKI. -Continue Coreg. -If renal function continues to improve can resume oral blood pressure regimens.  History of SVT - Continue Coreg 6.25 mg twice daily  History of OSA -Check pulse ox and supplemental oxygen at bedtime  Non-insulin-dependent DM type II -Holding Farxiga in the setting of AKI and dehydration. -Continue to check POC blood glucose and sliding scale insulin at bedtime insulin coverage.  Gout -Continue allopurinol   Morbid obesity class III - High BMI 51. - Counseled patient at bedside for smoking cessation, exercise and diet control.  Hypothyroidism -Continue levothyroxine   Hyperlipidemia - Continue fenofibrate and Crestor  Iron deficiency anemia - Continue oral iron supplement  Generalized  anxiety disorder -Continue BuSpar  History of COPD Chronic hypoxic respiratory failure 2 L oxygen at baseline - Continue Anoro Ellipta once daily, Pulmicort twice daily and DuoNeb as needed.   DVT prophylaxis:  SQ Heparin Code Status:  Full Code Diet: Heart healthy carb modified diet Family Communication:   Family was present at bedside, at the time of interview. Opportunity was given to ask question and all questions were answered satisfactorily.  Disposition Plan: Continue monitor improvement of renal function.  Need to follow-up with troponin level. Consults: None at this time Admission status:   Observation, Telemetry bed  Severity of Illness: The appropriate patient status for this patient is OBSERVATION. Observation status is judged to be reasonable and necessary in order to provide the required intensity of service to ensure the patient's safety. The patient's presenting symptoms, physical exam findings, and initial radiographic and laboratory data  in the context of their medical condition is felt to place them at decreased risk for further clinical deterioration. Furthermore, it is anticipated that the patient will be medically stable for discharge from the hospital within 2 midnights of admission.     Tereasa Coop, MD [facility]  How to contact the [place] Attending or Consulting provider 7A - 7P or covering provider during after hours 7P -7A, for this patient.  Check the care team in [employer] and look for a) attending/consulting [facility] provider listed and b) the [facility] team listed Log into [email or web address] and use [place]'s universal password to access. If you do not have the password, please contact the hospital operator. Locate the [facility] provider you are looking for under [facility] and page to a number that you can be directly reached. If you still have difficulty reaching the provider, please page the [facility] (Director on Call) for the Hospitalists listed on amion for  assistance.  06/07/2023, 12:03 AM

## 2023-06-06 NOTE — ED Provider Notes (Signed)
 [place] [facility]ERGENCY DEPARTMENT AT [facility] Provider Note   CSN: [id number] Arrival date & time: 06/06/23  2130     History  Chief Complaint  Patient presents with   Abdominal Pain    Michele Owens is a 63 y.o. female.  63 year old female presenting emergency department with abdominal pain, nausea which started early this morning.  She also reported that she is having some central chest pressure.  No shortness of breath.  Symptoms seemingly started after having breakfast.,  But has had decreased p.o. intake past day or 2.   Abdominal Pain      Home Medications Prior to Admission medications   Medication Sig Start Date End Date Taking? Authorizing Provider  acetaminophen (TYLENOL) 500 MG tablet Take 1,000 mg by mouth every 6 (six) hours as needed for moderate pain (pain score 4-6) or headache.    [provider]  albuterol (VENTOLIN HFA) 108 (90 Base) MCG/ACT inhaler Inhale 1-2 puffs into the lungs every 6 (six) hours as needed for wheezing or shortness of breath. 11/24/22   Laurey Morale, MD  allopurinol (ZYLOPRIM) 100 MG tablet Take 100 mg by mouth daily.    [provider]  aspirin EC 81 MG tablet Take 81 mg by mouth daily. Swallow whole.    [provider]  blood glucose meter kit and supplies KIT Dispense based on patient and insurance preference. Use up to four times daily as directed. 12/25/20   Azucena Fallen, MD  budesonide (PULMICORT) 0.25 MG/2ML nebulizer solution Inhale 2 mLs (0.25 mg total) by nebulization 2 (two) times daily. 11/24/22   Laurey Morale, MD  busPIRone (BUSPAR) 5 MG tablet Take 1 tablet (5 mg total) by mouth 3 (three) times daily. 02/07/23   Avanell Shackleton, NP-C  butalbital-acetaminophen-caffeine (FIORICET) [phone] MG tablet Take 1-2 tablets by mouth every 6 (six) hours as needed for headache. 05/08/23 05/07/24  [name], PA-C  calcitRIOL (ROCALTROL) 0.5 MCG capsule Take 1 capsule (0.5 mcg  total) by mouth daily. 09/26/16   Hongalgi, Maximino Greenland, MD  carvedilol (COREG) 6.25 MG tablet Take 1 tablet (6.25 mg total) by mouth 2 (two) times daily. 05/23/23   Milford, Anderson Malta, FNP  clonazePAM (KLONOPIN) 2 MG tablet Take 1 tablet (2 mg total) by mouth 3 (three) times daily. 11/24/22   Laurey Morale, MD  cyclobenzaprine (FLEXERIL) 10 MG tablet Take 10 mg by mouth 3 (three) times daily as needed for muscle spasms. 11/19/19   [provider]  EPINEPHrine 0.3 mg/0.3 mL IJ SOAJ injection Inject 0.3 mg into the muscle once as needed for anaphylaxis.    [provider]  FARXIGA 10 MG TABS tablet Take 1 tablet (10 mg total) by mouth daily before breakfast. 02/02/23   Laurey Morale, MD  fenofibrate (TRICOR) 145 MG tablet Take 1 tablet (145 mg total) by mouth daily. 02/02/23   Laurey Morale, MD  FEROSUL 325 (65 Fe) MG tablet Take 1 tablet (325 mg total) by mouth daily. 02/09/23   Henson, Vickie L, NP-C  haloperidol (HALDOL) 5 MG tablet Take 1 tablet (5 mg total) by mouth at bedtime. 12/16/22 05/23/23  Willeen Niece, MD  levocetirizine (XYZAL) 5 MG tablet Take 1 tablet (5 mg total) by mouth at bedtime. 11/24/22   Laurey Morale, MD  levothyroxine (SYNTHROID) 88 MCG tablet Take 1 tablet (88 mcg total) by mouth daily. 02/09/23   Henson, Vickie L, NP-C  lidocaine (LIDODERM) 5 % Place  1 patch onto the skin daily. Remove & Discard patch within 12 hours or as directed by MD 10/12/22   Blue, Soijett A, PA-C  loperamide (IMODIUM) 2 MG capsule Take 1 capsule (2 mg total) by mouth 4 (four) times daily as needed for diarrhea or loose stools. 11/24/22   Laurey Morale, MD  losartan (COZAAR) 25 MG tablet Take 1 tablet (25 mg total) by mouth daily. 02/02/23   Laurey Morale, MD  nitroGLYCERIN (NITROSTAT) 0.4 MG SL tablet Place 1 tablet (0.4 mg total) under the tongue every 5 (five) minutes as needed for chest pain. 05/23/23   Milford, Anderson Malta, FNP  nystatin cream (MYCOSTATIN) Apply 1 Application  topically 2 (two) times daily as needed for dry skin. 09/26/22   [provider]  omeprazole (PRILOSEC) 20 MG capsule Take 1 capsule (20 mg total) by mouth 2 (two) times daily as needed (acid reflux). 11/24/22   Laurey Morale, MD  ondansetron (ZOFRAN) 4 MG tablet Take 4 mg by mouth every 8 (eight) hours as needed for nausea or vomiting. Patient not taking: Reported on 02/02/2023 01/16/22   [provider]  OXYGEN Inhale 3 L into the lungs continuous.    [provider]  polyethylene glycol (MIRALAX / GLYCOLAX) 17 g packet Take 17 g by mouth daily as needed for mild constipation. Patient not taking: Reported on 02/02/2023 11/24/22   Laurey Morale, MD  potassium chloride (KLOR-CON) 10 MEQ tablet TAKE 1 TABLET (10 MEQ TOTAL) BY MOUTH 2 (TWO) TIMES DAILY. 05/10/23   Laurey Morale, MD  predniSONE (DELTASONE) 20 MG tablet Take 2 tablets (40 mg total) by mouth daily with breakfast. For the next four days 05/13/23   Gerhard Munch, MD  predniSONE (STERAPRED UNI-PAK 21 TAB) 10 MG (21) TBPK tablet TAKE AS DIRECTED FOR 6 DAYS 06/04/23   Cristie Hem, PA-C  rosuvastatin (CRESTOR) 10 MG tablet Take 1 tablet (10 mg total) by mouth every evening. 02/02/23   Laurey Morale, MD  senna-docusate (SENOKOT-S) 8.6-50 MG tablet Take 1 tablet by mouth daily. 02/07/23   Henson, Vickie L, NP-C  spironolactone (ALDACTONE) 25 MG tablet Take 1 tablet (25 mg total) by mouth daily. 02/02/23   Laurey Morale, MD  SUBOXONE 4-1 MG FILM Place 0.5-1 Film under the tongue 3 (three) times daily as needed (Withdrawal). 10/16/22   [provider]  torsemide (DEMADEX) 20 MG tablet Take 2 tablets (40 mg total) by mouth 2 (two) times daily. 11/24/22   Laurey Morale, MD  traZODone (DESYREL) 50 MG tablet Take 2 tablets (100 mg total) by mouth at bedtime. 02/07/23   Henson, Vickie L, NP-C  umeclidinium-vilanterol (ANORO ELLIPTA) 62.5-25 MCG/ACT AEPB Inhale 1 puff into the lungs daily. 02/09/23    Henson, Vickie L, NP-C      Allergies    Bee venom, Fioricet [butalbital-apap-caffeine], Ibuprofen, Lamisil [terbinafine], and Nsaids    Review of Systems   Review of Systems  Gastrointestinal:  Positive for abdominal pain.    Physical Exam Updated Vital Signs BP 119/63 (BP Location: Right Arm)   Pulse 80   Temp 98.4 F (36.9 C)   Resp 16   Wt (!) 141 kg   SpO2 96%   BMI 51.73 kg/m  Physical Exam Vitals and nursing note reviewed.  Constitutional:      General: She is not in acute distress.    Appearance: She is obese. She is not toxic-appearing.  HENT:  Head: Normocephalic.  Cardiovascular:     Rate and Rhythm: Normal rate and regular rhythm.  Pulmonary:     Effort: Pulmonary effort is normal.     Breath sounds: Normal breath sounds.  Abdominal:     General: Abdomen is flat.     Palpations: Abdomen is soft.     Tenderness: There is abdominal tenderness (mild).  Skin:    General: Skin is warm and dry.     Capillary Refill: Capillary refill takes less than 2 seconds.  Neurological:     General: No focal deficit present.     Mental Status: She is alert.  Psychiatric:        Mood and Affect: Mood normal.        Behavior: Behavior normal.     ED Results / Procedures / Treatments   Labs (all labs ordered are listed, but only abnormal results are displayed) Labs Reviewed  COMPREHENSIVE METABOLIC PANEL - Abnormal; Notable for the following components:      Result Value   Chloride 94 (*)    Glucose, Bld 131 (*)    BUN 40 (*)    Creatinine, Ser 1.74 (*)    Calcium 7.7 (*)    GFR, Estimated 33 (*)    All other components within normal limits  URINALYSIS, ROUTINE W REFLEX MICROSCOPIC - Abnormal; Notable for the following components:   Color, Urine STRAW (*)    Glucose, UA >=500 (*)    All other components within normal limits  CBC WITH DIFFERENTIAL/PLATELET - Abnormal; Notable for the following components:   WBC 25.1 (*)    Neutro Abs 18.6 (*)    Lymphs  Abs 4.5 (*)    Basophils Absolute 0.3 (*)    Abs Immature Granulocytes 0.80 (*)    All other components within normal limits  CBG MONITORING, ED - Abnormal; Notable for the following components:   Glucose-Capillary 108 (*)    All other components within normal limits  RESP PANEL BY RT-PCR (RSV, FLU A&B, COVID)  RVPGX2  LIPASE, BLOOD  COMPREHENSIVE METABOLIC PANEL  CBC  CREATININE, URINE, RANDOM  SODIUM, URINE, RANDOM  URINALYSIS, ROUTINE W REFLEX MICROSCOPIC  TROPONIN I (HIGH SENSITIVITY)  TROPONIN I (HIGH SENSITIVITY)    EKG None  Radiology CT Renal Stone Study Result Date: 06/06/2023 CLINICAL DATA:  Abdominal pain, flank pain EXAM: CT ABDOMEN AND PELVIS WITHOUT CONTRAST TECHNIQUE: Multidetector CT imaging of the abdomen and pelvis was performed following the standard protocol without IV contrast. RADIATION DOSE REDUCTION: This exam was performed according to the departmental dose-optimization program which includes automated exposure control, adjustment of the mA and/or kV according to patient size and/or use of iterative reconstruction technique. COMPARISON:  03/09/2022 FINDINGS: Lower chest: Linear areas of atelectasis or scarring in the lung bases. No effusions. Hepatobiliary: No focal hepatic abnormality. Gallbladder unremarkable. Pancreas: No focal abnormality or ductal dilatation. Spleen: No focal abnormality.  Normal size. Adrenals/Urinary Tract: No adrenal abnormality. No focal renal abnormality. No stones or hydronephrosis. Urinary bladder is unremarkable. Stomach/Bowel: Normal appendix. Stomach, large and small bowel grossly unremarkable. Vascular/Lymphatic: No evidence of aneurysm or adenopathy. Scattered aortic atherosclerosis. Reproductive: Prior hysterectomy.  No adnexal masses. Other: No free fluid or free air. Musculoskeletal: No acute bony abnormality. IMPRESSION: No acute findings in the abdomen or pelvis. Scattered aortic atherosclerosis. Bibasilar atelectasis or scarring.  Electronically Signed   By: Charlett Nose M.D.   On: 06/06/2023 21:15   DG Chest Portable 1 View Result Date: 06/06/2023 CLINICAL DATA:  Chest pain EXAM: PORTABLE CHEST 1 VIEW COMPARISON:  X-ray 06/04/2023 and older.  CTA 12/13/2022 FINDINGS: Film is under penetrated. There is some linear opacity lung bases likely scar or atelectasis. No pneumothorax, effusion or edema. Enlarged cardiopericardial silhouette. Prominent central vasculature. Fullness seen at the right lung hilum. This is likely related to enlarged pulmonary arteries as seen on CTA of 12/13/2022. surgical clips seen at the thoracic inlet. IMPRESSION: Enlarged heart with prominence of the pulmonary vasculature. Basilar bandlike opacities. Atelectasis favored over infiltrate. Recommend follow-up. Electronically Signed   By: Karen Kays M.D.   On: 06/06/2023 19:50    Procedures Procedures    Medications Ordered in ED Medications  acetaminophen (TYLENOL) tablet 1,000 mg (has no administration in time range)  allopurinol (ZYLOPRIM) tablet 100 mg (has no administration in time range)  aspirin EC tablet 81 mg (has no administration in time range)  carvedilol (COREG) tablet 6.25 mg (has no administration in time range)  fenofibrate tablet 54 mg (has no administration in time range)  rosuvastatin (CRESTOR) tablet 10 mg (has no administration in time range)  busPIRone (BUSPAR) tablet 5 mg (has no administration in time range)  traZODone (DESYREL) tablet 100 mg (has no administration in time range)  levothyroxine (SYNTHROID) tablet 88 mcg (has no administration in time range)  pantoprazole (PROTONIX) EC tablet 40 mg (has no administration in time range)  ferrous sulfate tablet 325 mg (has no administration in time range)  budesonide (PULMICORT) nebulizer solution 0.25 mg (has no administration in time range)  umeclidinium-vilanterol (ANORO ELLIPTA) 62.5-25 MCG/ACT 1 puff (has no administration in time range)  heparin injection 5,000 Units  (has no administration in time range)  sodium chloride flush (NS) 0.9 % injection 3 mL (has no administration in time range)  sodium chloride flush (NS) 0.9 % injection 3 mL (has no administration in time range)  sodium chloride flush (NS) 0.9 % injection 3 mL (has no administration in time range)  0.9 %  sodium chloride infusion (has no administration in time range)  ondansetron (ZOFRAN) tablet 4 mg (has no administration in time range)    Or  ondansetron (ZOFRAN) injection 4 mg (has no administration in time range)  0.9 %  sodium chloride infusion (has no administration in time range)  insulin aspart (novoLOG) injection 0-6 Units (has no administration in time range)  insulin aspart (novoLOG) injection 0-5 Units (has no administration in time range)  ipratropium-albuterol (DUONEB) 0.5-2.5 (3) MG/3ML nebulizer solution 3 mL (has no administration in time range)  ondansetron (ZOFRAN-ODT) disintegrating tablet 4 mg (4 mg Oral Given 06/06/23 0939)  ALPRAZolam Prudy Feeler) tablet 0.25 mg (0.25 mg Oral Given 06/06/23 2228)  sodium chloride 0.9 % bolus 1,000 mL (1,000 mLs Intravenous New Bag/Given 06/06/23 2228)    ED Course/ Medical Decision Making/ A&P Clinical Course as of 06/06/23 2350  Wed Jun 06, 2023  2149 IMPRESSION: No acute findings in the abdomen or pelvis.  Scattered aortic atherosclerosis.  Bibasilar atelectasis or scarring.   Electronically Signed   [TY]    Clinical Course User Index [TY] Coral Spikes, DO                                 Medical Decision Making This is a 63 year old female presenting emergency department for abdominal pain with nausea, but also complaining of chest pain.  Workup initiated in triage showed progressively worsening leukocytosis, but no fever or tachycardia.  No  overt source of infection identified on exam.  She appears to have an AKI.  Suspect prerenal.  Giving IV fluids.  Constellation of symptoms with nausea and abdominal pain concern for  possible viral etiology.  However flu/COVID/RSV negative.  No transaminitis or elevated lipase to suggest pancreatitis or hepatobiliary disease.  CT scan without acute abnormality.  UA negative for UTI.  Patient has refused troponin until she gets food.  Unable to say that she is not having NSTEMI.  However given her elevation in creatinine feel that she would benefit from observation for further IV fluids.  Amount and/or Complexity of Data Reviewed External Data Reviewed:     Details: Left AMA several days ago similar abdominal pain Labs: ordered. Decision-making details documented in ED Course. Radiology: ordered. ECG/medicine tests:  Decision-making details documented in ED Course. Discussion of management or test interpretation with external provider(s): Case discussed with hospitalist agrees to see and admit patient.  Risk Prescription drug management. Decision regarding hospitalization.          Final Clinical Impression(s) / ED Diagnoses Final diagnoses:  AKI (acute kidney injury) [facility])    Rx / DC Orders ED Discharge Orders     None         Coral Spikes, DO 06/06/23 2350

## 2023-06-06 NOTE — ED Triage Notes (Addendum)
 Pt presents from home for abd pain (generalized, 10/10), nausea without emesis, startingat 3am today. Denies diarrhea, dysuria Baseline 2 L [name]  EMS VS: 140/84, 84bpm, 97% 2L, 206 CBG  Last oral intake this AM (sausage biscuit) H/o DM II

## 2023-06-07 DIAGNOSIS — N179 Acute kidney failure, unspecified: Secondary | ICD-10-CM | POA: Diagnosis not present

## 2023-06-07 DIAGNOSIS — N189 Chronic kidney disease, unspecified: Secondary | ICD-10-CM | POA: Diagnosis not present

## 2023-06-07 LAB — CBC
HCT: 40.5 % (ref 36.0–46.0)
Hemoglobin: 12.7 g/dL (ref 12.0–15.0)
MCH: 30.4 pg (ref 26.0–34.0)
MCHC: 31.4 g/dL (ref 30.0–36.0)
MCV: 96.9 fL (ref 80.0–100.0)
Platelets: 304 10*3/uL (ref 150–400)
RBC: 4.18 MIL/uL (ref 3.87–5.11)
RDW: 13.5 % (ref 11.5–15.5)
WBC: 18.6 10*3/uL — ABNORMAL HIGH (ref 4.0–10.5)
nRBC: 0 % (ref 0.0–0.2)

## 2023-06-07 LAB — COMPREHENSIVE METABOLIC PANEL WITH GFR
ALT: 22 U/L (ref 0–44)
AST: 17 U/L (ref 15–41)
Albumin: 3.6 g/dL (ref 3.5–5.0)
Alkaline Phosphatase: 55 U/L (ref 38–126)
Anion gap: 16 — ABNORMAL HIGH (ref 5–15)
BUN: 25 mg/dL — ABNORMAL HIGH (ref 8–23)
CO2: 28 mmol/L (ref 22–32)
Calcium: 8.3 mg/dL — ABNORMAL LOW (ref 8.9–10.3)
Chloride: 94 mmol/L — ABNORMAL LOW (ref 98–111)
Creatinine, Ser: 1 mg/dL (ref 0.44–1.00)
GFR, Estimated: >60 mL/min
Glucose, Bld: 171 mg/dL — ABNORMAL HIGH (ref 70–99)
Potassium: 3.6 mmol/L (ref 3.5–5.1)
Sodium: 138 mmol/L (ref 135–145)
Total Bilirubin: 0.6 mg/dL (ref 0.0–1.2)
Total Protein: 6.6 g/dL (ref 6.5–8.1)

## 2023-06-07 LAB — CBG MONITORING, ED: Glucose-Capillary: 162 mg/dL — ABNORMAL HIGH (ref 70–99)

## 2023-06-07 LAB — TROPONIN I (HIGH SENSITIVITY): Troponin I (High Sensitivity): 7 ng/L (ref <18)

## 2023-06-07 IMAGING — DX DG CHEST 1V PORT
1 series · 1 of 1 positions shown · non-contrast
Comparison: August 21, 2020

CLINICAL DATA: Nausea.

EXAM:
PORTABLE CHEST 1 VIEW

[chest ap]
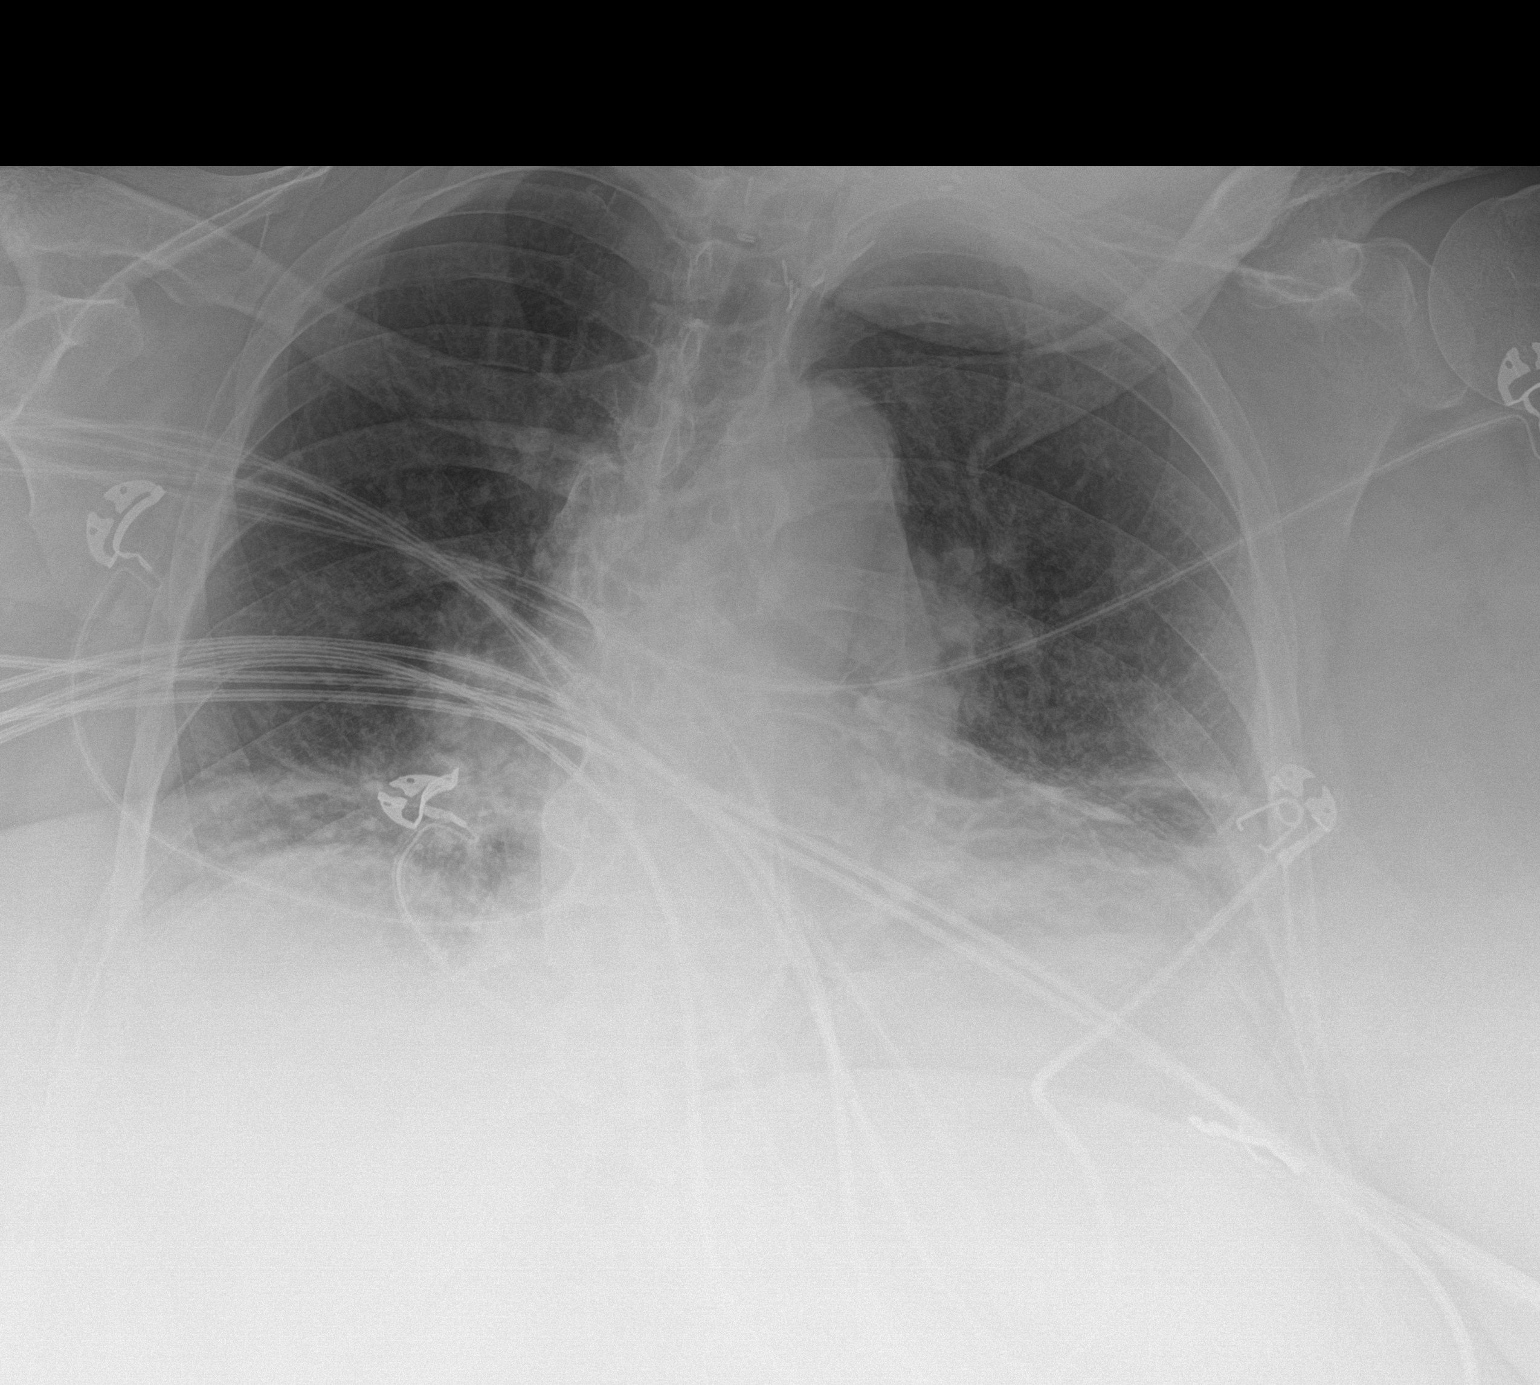

[1 of 1 positions shown; findings below may reference images not displayed]

FINDINGS: Mildly decreased lung volumes are seen. Mild to moderate severity
linear scarring and/or atelectasis is noted within the bilateral
lung bases. There is no evidence of a pleural effusion or
pneumothorax. The cardiac silhouette is enlarged and unchanged in
size. Multilevel degenerative changes are seen throughout the
thoracic spine.
IMPRESSION: Mild to moderate severity bibasilar linear scarring and/or
atelectasis.

## 2023-06-07 IMAGING — CT CT ABD-PELV W/ CM
2 of 5 series · 16 of 46 positions shown, 18 images · IV contrast (APPLIED)
Comparison: CT 06/22/2020

CLINICAL DATA: Acute nonlocalized abdominal pain.  Nausea.

EXAM:
CT ABDOMEN AND PELVIS WITH CONTRAST
TECHNIQUE: Multidetector CT imaging of the abdomen and pelvis was performed
using the standard protocol following bolus administration of
intravenous contrast.
CONTRAST:  100mL OMNIPAQUE IOHEXOL 300 MG/ML  SOLN

[Series 3: abd/ pelvis 5.0 i30f 4 · axial · 0.98mm/px · z∈[+876,+1301]mm · 13 of 96 slices shown, 15 images]
[im 6/96  soft-tissue]
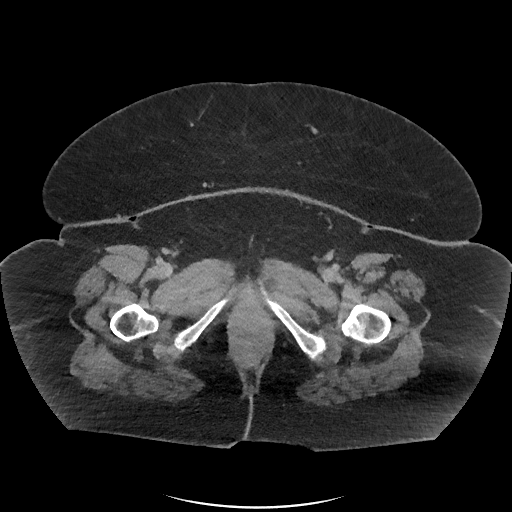
[im 6/96  bone]
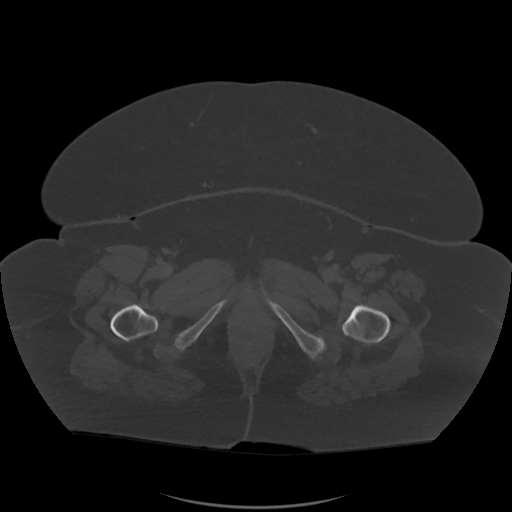
[im 16/96  soft-tissue]
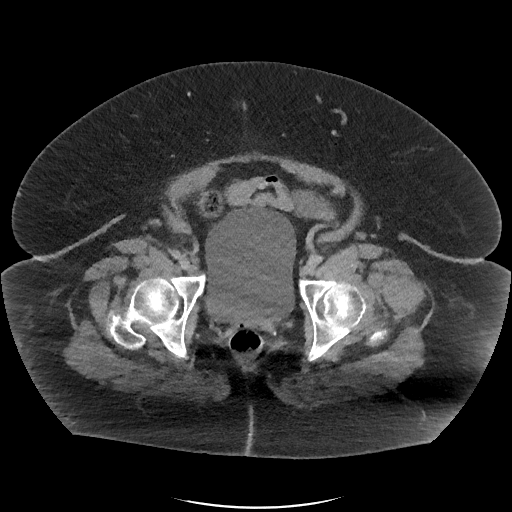
[im 21/96  soft-tissue]
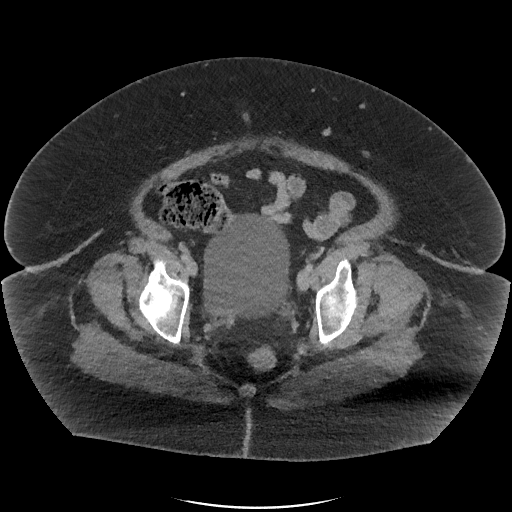
[im 26/96  soft-tissue]
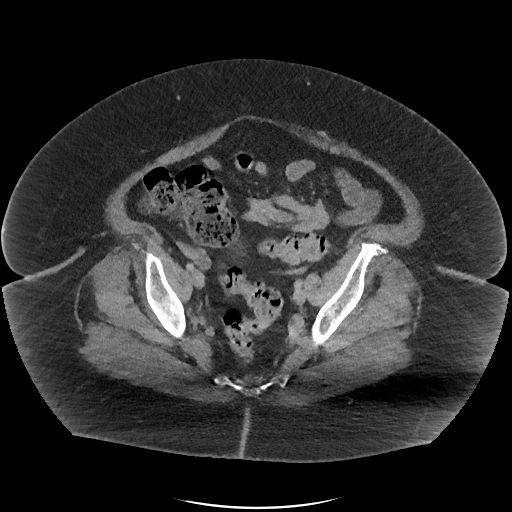
[im 36/96  soft-tissue]
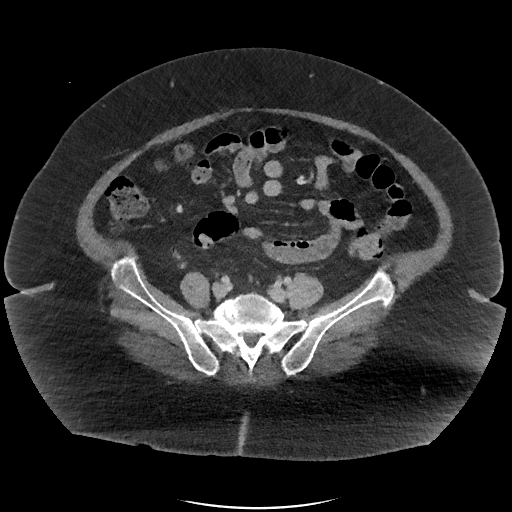
[im 41/96  soft-tissue]
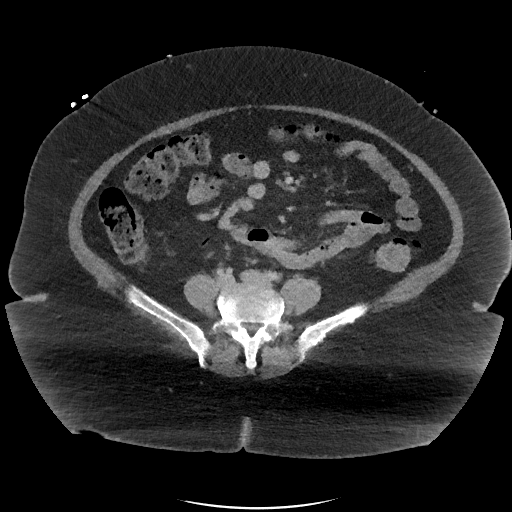
[im 51/96  soft-tissue]
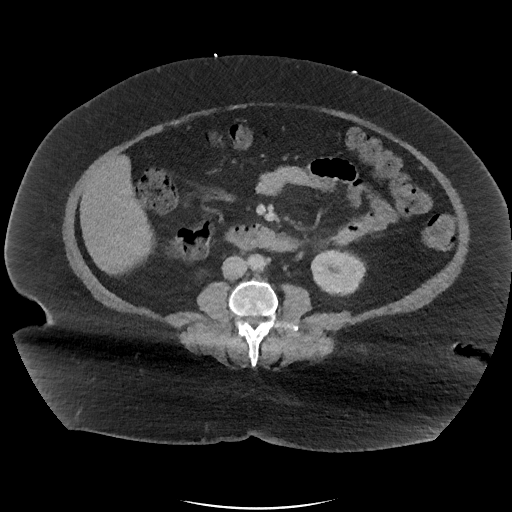
[im 56/96  soft-tissue]
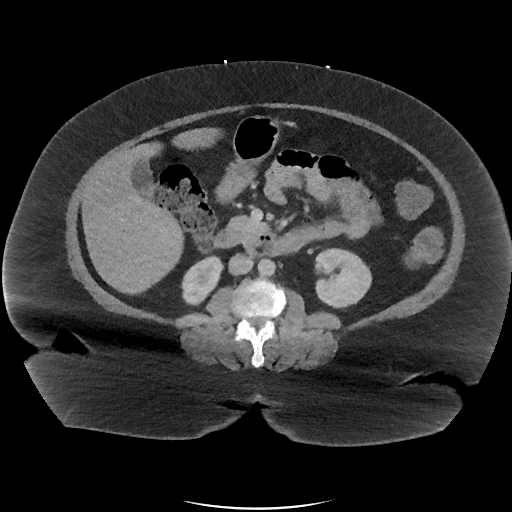
[im 61/96  soft-tissue]
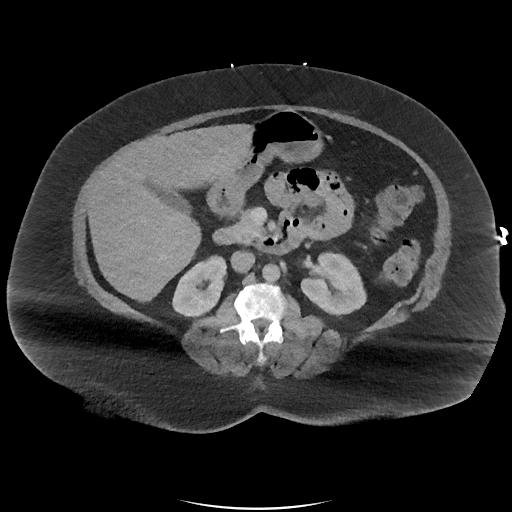
[im 61/96  bone]
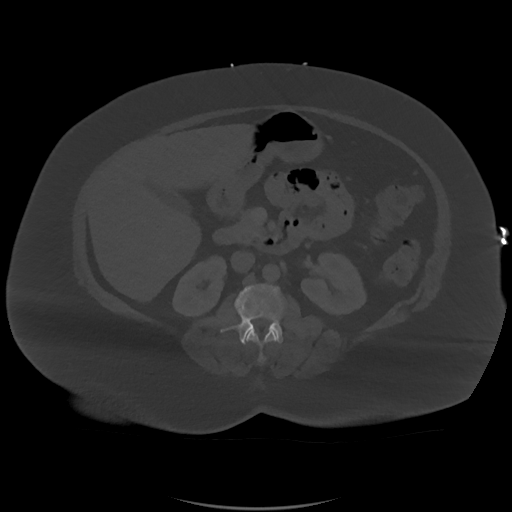
[im 71/96  soft-tissue]
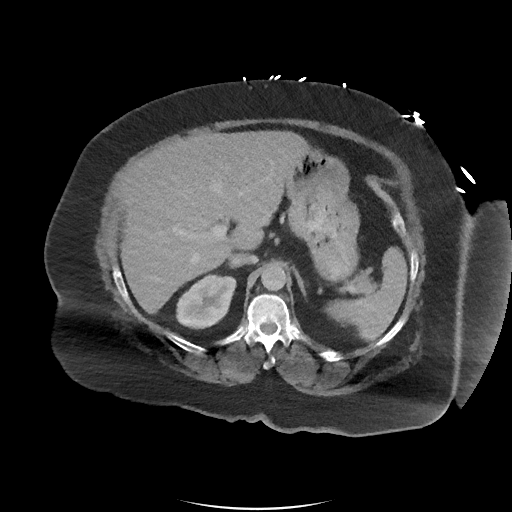
[im 76/96  soft-tissue]
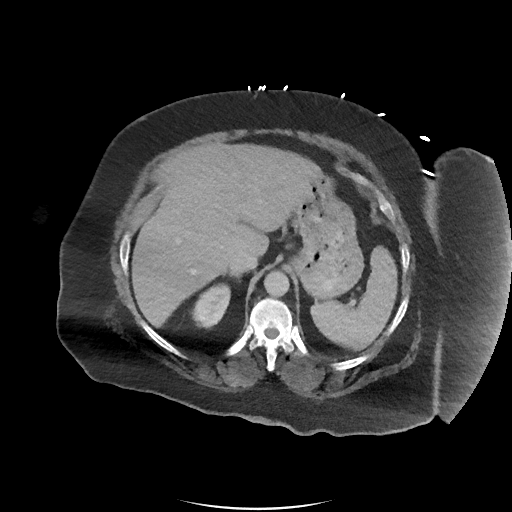
[im 81/96  soft-tissue]
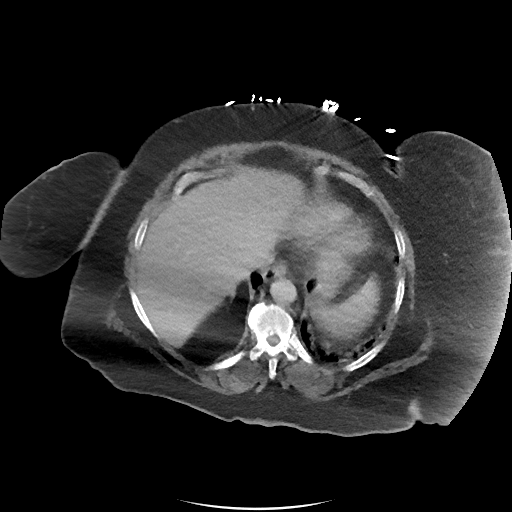
[im 91/96  soft-tissue]
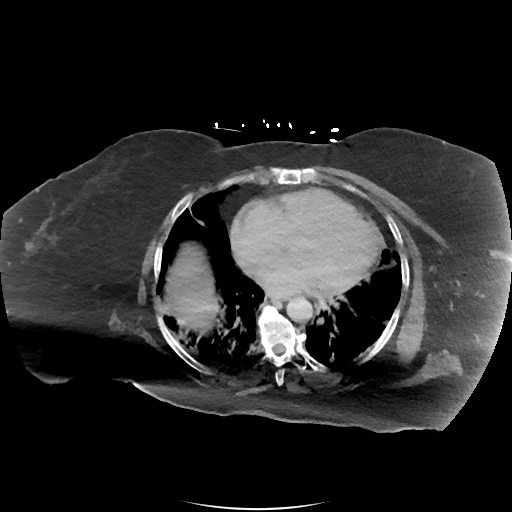

[Series 6: coronal soft tissue · coronal · 0.93mm/px · 3 of 137 slices shown]
[im 46/137  soft-tissue]
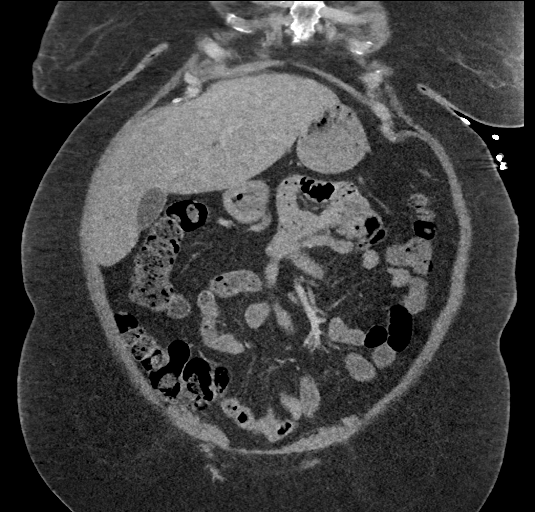
[im 61/137  soft-tissue]
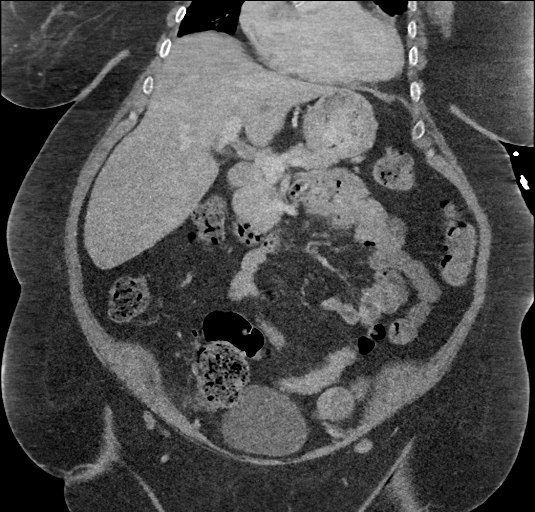
[im 76/137  soft-tissue]
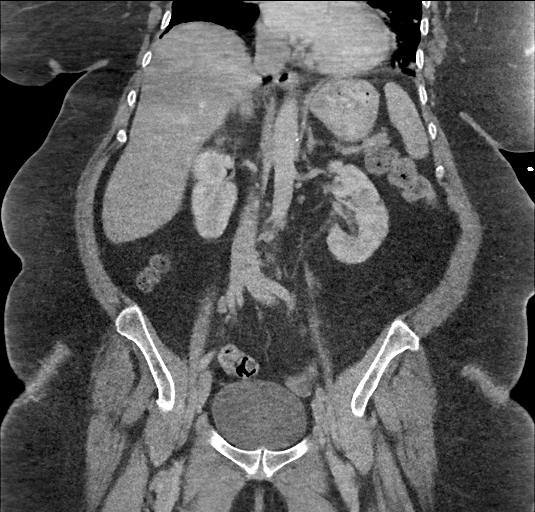

[16 of 46 positions shown; findings below may reference images not displayed]

FINDINGS: Lower chest: Similar cardiomegaly. Linear airspace disease in both
lower lobes, favor atelectasis.

Hepatobiliary: Enlarged liver spanning at least 22.7 cm cranial
caudal with steatosis. There is question of micro nodularity of the
hepatic contour. No focal lesion. Gallbladder physiologically
distended, no calcified stone. No biliary dilatation.

Pancreas: No ductal dilatation or inflammation.

Spleen: No splenomegaly.  No focal abnormality.

Adrenals/Urinary Tract: Normal adrenal glands. No hydronephrosis or
perinephric edema. Homogeneous renal enhancement. No focal renal
lesion or evidence of stone. Urinary bladder is physiologically
distended without wall thickening.

Stomach/Bowel: Ingested material within the stomach. There is no
small bowel obstruction or inflammation. Normal air-filled appendix.
Small to moderate volume of colonic stool. No colonic wall
thickening or inflammation.

Vascular/Lymphatic: Mild aortic atherosclerosis. No aortic aneurysm.
Patent portal vein. No portal venous or mesenteric gas. No
abdominopelvic adenopathy.

Reproductive: Hysterectomy.  Quiescent ovaries.  No adnexal mass.

Other: No ascites or free air. Tiny fat containing umbilical hernia.

Musculoskeletal: There are no acute or suspicious osseous
abnormalities.
IMPRESSION: 1. No acute abnormality in the abdomen/pelvis.
2. Hepatomegaly and hepatic steatosis. There is question of micro
nodularity of the hepatic contour, raising concern for cirrhosis.
Recommend correlation with cirrhosis risk factors.

Aortic Atherosclerosis ([id number].I).

## 2023-06-07 MED ORDER — LEVOTHYROXINE SODIUM 25 MCG PO TABS: 125.0000 ug | ORAL_TABLET | Freq: Every day | ORAL | Status: DC

## 2023-06-07 MED ORDER — LACTATED RINGERS IV SOLN: INTRAVENOUS | Status: DC

## 2023-06-07 MED ORDER — BUTALBITAL-APAP-CAFFEINE 50-325-40 MG PO TABS
1.0000 | ORAL_TABLET | Freq: Once | ORAL | Status: AC
  Administered 2023-06-07: 1 via ORAL
  Filled 2023-06-07: qty 1

## 2023-06-07 NOTE — Discharge Summary (Signed)
 Physician Discharge Summary   Patient: Michele Owens MRN: [id number] DOB: Jun 11, 1960  Admit date:     06/06/2023  Discharge date: {dischdate:26783}  Discharge Physician: Jacquelin Hawking   PCP: [facility], [facility]   Recommendations at discharge:  ***  Discharge Diagnoses: Principal Problem:   Acute kidney injury superimposed on chronic kidney disease [facility]) Active Problems:   Chest pain   Chronic hypoxic respiratory failure (HCC)   OSA (obstructive sleep apnea)   Non-insulin dependent type 2 diabetes mellitus (HCC)   Gout   Essential hypertension   Severe obesity (BMI >= 40) (HCC)   Chronic diastolic CHF (congestive heart failure) (HCC)   Hypothyroidism   History of COPD   Bipolar disorder (HCC)  Resolved Problems:   AKI (acute kidney injury) [facility])  Hospital Course: No notes on file  Assessment and Plan:  AKI on CKD stage II ***  Noncardiac chest pain ***  HFpEF Primary hypertension SVT OSA Diabetes mellitus type 2 Gout Morbid obesity class III Hypothyroidism Hyperlipidemia Iron deficiency  anemia Generalized anxiety disorder COPD Chronic respiratory failure No changes to outpatient regimen.   {Tip this will not be part of the note when signed Body mass index is 51.73 kg/m. , ,  (Optional):26781}  {(NOTE) Pain control PDMP Statment (Optional):26782} Consultants: *** Procedures performed: ***  Disposition: {Plan; Disposition:26390} Diet recommendation:  {Diet_Plan:26776} DISCHARGE MEDICATION: Allergies as of 06/07/2023       Reactions   Bee Venom Anaphylaxis   "All over my body" (swelling)   Fioricet [butalbital-apap-caffeine] Nausea And Vomiting, Rash   Ibuprofen Rash, Other (See Comments)   Severe rash   Lamisil [terbinafine] Rash, Other (See Comments)   Pt states this causes her to "feel funny"   Nsaids Other (See Comments)   Per MD's orders         Medication List     PAUSE taking these medications    allopurinol 100 MG  tablet Wait to take this until your doctor or other care provider tells you to start again. Commonly known as: ZYLOPRIM Take 100 mg by mouth daily.       STOP taking these medications    clonazePAM 2 MG tablet Commonly known as: KLONOPIN   cyclobenzaprine 10 MG tablet Commonly known as: FLEXERIL   lidocaine 5 % Commonly known as: Lidoderm   loperamide 2 MG capsule Commonly known as: IMODIUM   predniSONE 10 MG (21) Tbpk tablet Commonly known as: STERAPRED UNI-PAK 21 TAB       TAKE these medications    ACETAMINOPHEN PO Take 1,300-2,050 mg by mouth daily as needed for moderate pain (pain score 4-6) or headache.   albuterol 108 (90 Base) MCG/ACT inhaler Commonly known as: VENTOLIN HFA Inhale 1-2 puffs into the lungs every 6 (six) hours as needed for wheezing or shortness of breath.   aspirin EC 81 MG tablet Take 81 mg by mouth daily. Swallow whole.   blood glucose meter kit and supplies Kit Dispense based on patient and insurance preference. Use up to four times daily as directed.   budesonide 0.25 MG/2ML nebulizer solution Commonly known as: PULMICORT Inhale 2 mLs (0.25 mg total) by nebulization 2 (two) times daily. What changed:  when to take this reasons to take this   busPIRone 5 MG tablet Commonly known as: BUSPAR Take 1 tablet (5 mg total) by mouth 3 (three) times daily. What changed: how much to take   butalbital-acetaminophen-caffeine 50-325-40 MG tablet Commonly known as: FIORICET Take 1-2 tablets by mouth  every 6 (six) hours as needed for headache.   calcitRIOL 0.5 MCG capsule Commonly known as: ROCALTROL Take 1 capsule (0.5 mcg total) by mouth daily.   carvedilol 6.25 MG tablet Commonly known as: COREG Take 1 tablet (6.25 mg total) by mouth 2 (two) times daily.   EPINEPHrine 0.3 mg/0.3 mL Soaj injection Commonly known as: EPI-PEN Inject 0.3 mg into the muscle once as needed for anaphylaxis.   Farxiga 10 MG Tabs tablet Generic drug:  dapagliflozin propanediol Take 1 tablet (10 mg total) by mouth daily before breakfast.   fenofibrate 145 MG tablet Commonly known as: TRICOR Take 1 tablet (145 mg total) by mouth daily.   FeroSul 325 (65 Fe) MG tablet Generic drug: ferrous sulfate Take 1 tablet (325 mg total) by mouth daily.   levocetirizine 5 MG tablet Commonly known as: XYZAL Take 1 tablet (5 mg total) by mouth at bedtime.   levothyroxine 125 MCG tablet Commonly known as: SYNTHROID Take 125 mcg by mouth at bedtime. What changed: Another medication with the same name was removed. Continue taking this medication, and follow the directions you see here.   losartan 25 MG tablet Commonly known as: COZAAR Take 1 tablet (25 mg total) by mouth daily.   nitroGLYCERIN 0.4 MG SL tablet Commonly known as: NITROSTAT Place 1 tablet (0.4 mg total) under the tongue every 5 (five) minutes as needed for chest pain.   nystatin cream Commonly known as: MYCOSTATIN Apply 1 Application topically 2 (two) times daily as needed for dry skin.   omeprazole 20 MG capsule Commonly known as: PRILOSEC Take 1 capsule (20 mg total) by mouth 2 (two) times daily as needed (acid reflux). What changed:  how much to take when to take this   ondansetron 4 MG tablet Commonly known as: ZOFRAN Take 4 mg by mouth every 8 (eight) hours as needed for nausea or vomiting.   OXYGEN Inhale 2 L into the lungs continuous.   Ozempic (0.25 or 0.5 MG/DOSE) 2 MG/3ML Sopn Generic drug: Semaglutide(0.25 or 0.5MG /DOS) Inject 0.5 mg into the skin every Friday.   polyethylene glycol 17 g packet Commonly known as: MIRALAX / GLYCOLAX Take 17 g by mouth daily as needed for mild constipation.   potassium chloride 10 MEQ tablet Commonly known as: KLOR-CON TAKE 1 TABLET (10 MEQ TOTAL) BY MOUTH 2 (TWO) TIMES DAILY.   rosuvastatin 10 MG tablet Commonly known as: CRESTOR Take 1 tablet (10 mg total) by mouth every evening.   senna-docusate 8.6-50 MG  tablet Commonly known as: Senokot-S Take 1 tablet by mouth daily.   spironolactone 25 MG tablet Commonly known as: ALDACTONE Take 1 tablet (25 mg total) by mouth daily.   torsemide 20 MG tablet Commonly known as: DEMADEX Take 2 tablets (40 mg total) by mouth 2 (two) times daily.   traZODone 50 MG tablet Commonly known as: DESYREL Take 2 tablets (100 mg total) by mouth at bedtime.   umeclidinium-vilanterol 62.5-25 MCG/ACT Aepb Commonly known as: ANORO ELLIPTA Inhale 1 puff into the lungs daily.   Vitamin D (Ergocalciferol) 1.25 MG (50000 UNIT) Caps capsule Commonly known as: DRISDOL Take 50,000 Units by mouth once a week.   zolpidem 10 MG tablet Commonly known as: AMBIEN Take 10 mg by mouth at bedtime.        Follow-up Information     Center, [facility]. Schedule an appointment as soon as possible for a visit in 4 day(s).   Why: For hospital follow-up. Complete blood count. Contact information:  [address] [place] [address] [phone]                Discharge Exam: Ceasar Mons Weights   06/06/23 9147  Weight: (!) 141 kg   ***  Condition at discharge: {DC Condition:26389}  The results of significant diagnostics from this hospitalization (including imaging, microbiology, ancillary and laboratory) are listed below for reference.   Imaging Studies: CT Renal Stone Study Result Date: 06/06/2023 CLINICAL DATA:  Abdominal pain, flank pain EXAM: CT ABDOMEN AND PELVIS WITHOUT CONTRAST TECHNIQUE: Multidetector CT imaging of the abdomen and pelvis was performed following the standard protocol without IV contrast. RADIATION DOSE REDUCTION: This exam was performed according to the departmental dose-optimization program which includes automated exposure control, adjustment of the mA and/or kV according to patient size and/or use of iterative reconstruction technique. COMPARISON:  03/09/2022 FINDINGS: Lower chest: Linear areas of atelectasis or scarring in  the lung bases. No effusions. Hepatobiliary: No focal hepatic abnormality. Gallbladder unremarkable. Pancreas: No focal abnormality or ductal dilatation. Spleen: No focal abnormality.  Normal size. Adrenals/Urinary Tract: No adrenal abnormality. No focal renal abnormality. No stones or hydronephrosis. Urinary bladder is unremarkable. Stomach/Bowel: Normal appendix. Stomach, large and small bowel grossly unremarkable. Vascular/Lymphatic: No evidence of aneurysm or adenopathy. Scattered aortic atherosclerosis. Reproductive: Prior hysterectomy.  No adnexal masses. Other: No free fluid or free air. Musculoskeletal: No acute bony abnormality. IMPRESSION: No acute findings in the abdomen or pelvis. Scattered aortic atherosclerosis. Bibasilar atelectasis or scarring. Electronically Signed   By: Charlett Nose M.D.   On: 06/06/2023 21:15   DG Chest Portable 1 View Result Date: 06/06/2023 CLINICAL DATA:  Chest pain EXAM: PORTABLE CHEST 1 VIEW COMPARISON:  X-ray 06/04/2023 and older.  CTA 12/13/2022 FINDINGS: Film is under penetrated. There is some linear opacity lung bases likely scar or atelectasis. No pneumothorax, effusion or edema. Enlarged cardiopericardial silhouette. Prominent central vasculature. Fullness seen at the right lung hilum. This is likely related to enlarged pulmonary arteries as seen on CTA of 12/13/2022. surgical clips seen at the thoracic inlet. IMPRESSION: Enlarged heart with prominence of the pulmonary vasculature. Basilar bandlike opacities. Atelectasis favored over infiltrate. Recommend follow-up. Electronically Signed   By: Karen Kays M.D.   On: 06/06/2023 19:50   DG Chest 1 View Result Date: 06/04/2023 CLINICAL DATA:  Central chest pain since yesterday, diaphoresis, short of breath EXAM: CHEST  1 VIEW COMPARISON:  05/18/2023 FINDINGS: Single frontal view of the chest demonstrates stable enlargement of the cardiac silhouette. Increased central pulmonary vascular congestion without airspace  disease, effusion, or pneumothorax. No acute bony abnormalities. IMPRESSION: 1. Central pulmonary vascular congestion without overt edema. 2. Stable enlarged cardiac silhouette. Electronically Signed   By: Sharlet Salina M.D.   On: 06/04/2023 21:15   DG Chest Port 1 View Result Date: 05/18/2023 CLINICAL DATA:  Irregular heart beat. EXAM: PORTABLE CHEST 1 VIEW COMPARISON:  May 08, 2023 FINDINGS: The cardiac silhouette is mildly enlarged and unchanged in size. Radiopaque surgical clips are seen overlying the expected region of the thyroid gland. Mild prominence of the central pulmonary vasculature is again seen. Mild atelectatic changes are noted within the bilateral lung bases. No pleural effusion or pneumothorax is identified. Multilevel degenerative changes are present throughout the thoracic spine. IMPRESSION: Stable cardiomegaly with mild bibasilar atelectasis. Electronically Signed   By: Aram Candela M.D.   On: 05/18/2023 23:43   DG Knee Complete 4 Views Right Result Date: 05/13/2023 CLINICAL DATA:  Fall. EXAM: RIGHT KNEE - COMPLETE 4+ VIEW COMPARISON:  None Available. FINDINGS: No evidence of fracture, dislocation, or joint effusion. Mild narrowing of all 3 joint spaces is noted. Soft tissues are unremarkable. IMPRESSION: Mild tricompartmental degenerative joint disease. No acute abnormality seen. Electronically Signed   By: Lupita Raider M.D.   On: 05/13/2023 18:27   DG Shoulder Right Result Date: 05/13/2023 CLINICAL DATA:  Right shoulder pain after fall. EXAM: RIGHT SHOULDER - 2+ VIEW COMPARISON:  June 26, 2021. FINDINGS: There is no evidence of fracture or dislocation. Moderate degenerative changes seen involving right acromioclavicular joint. Soft tissues are unremarkable. IMPRESSION: Moderate degenerative joint disease of right acromioclavicular joint. No acute abnormality seen. Electronically Signed   By: Lupita Raider M.D.   On: 05/13/2023 18:25   DG Knee Complete 4 Views  Left Result Date: 05/13/2023 CLINICAL DATA:  Worsening pain after a fall. EXAM: LEFT KNEE - COMPLETE 4+ VIEW COMPARISON:  05/08/2023 and 01/19/2023. FINDINGS: There may be a tiny joint effusion. No acute osseous abnormality. Mild tri compartment osteophytosis. IMPRESSION: 1. Tiny joint effusion.  No fracture. 2. Tricompartment osteophytosis. Electronically Signed   By: Leanna Battles M.D.   On: 05/13/2023 18:24   CT HEAD WO CONTRAST Result Date: 05/08/2023 CLINICAL DATA:  Head trauma, moderate-severe EXAM: CT HEAD WITHOUT CONTRAST TECHNIQUE: Contiguous axial images were obtained from the base of the skull through the vertex without intravenous contrast. RADIATION DOSE REDUCTION: This exam was performed according to the departmental dose-optimization program which includes automated exposure control, adjustment of the mA and/or kV according to patient size and/or use of iterative reconstruction technique. COMPARISON:  12/13/2022 FINDINGS: Brain: Scratch no intracranial hemorrhage, mass effect, or midline shift. No hydrocephalus. The basilar cisterns are patent. No evidence of territorial infarct or acute ischemia. No extra-axial or intracranial fluid collection. Vascular: Atherosclerosis of skullbase vasculature without hyperdense vessel or abnormal calcification. Skull: No fracture or focal lesion. Sinuses/Orbits: Chronic defect of the left lamina papyracea. No acute findings. Other: No confluent scalp hematoma. IMPRESSION: No acute intracranial abnormality. No skull fracture. Electronically Signed   By: Narda Rutherford M.D.   On: 05/08/2023 23:02   CT Lumbar Spine Wo Contrast Result Date: 05/08/2023 CLINICAL DATA:  Back trauma EXAM: CT LUMBAR SPINE WITHOUT CONTRAST TECHNIQUE: Multidetector CT imaging of the lumbar spine was performed without intravenous contrast administration. Multiplanar CT image reconstructions were also generated. RADIATION DOSE REDUCTION: This exam was performed according to the  departmental dose-optimization program which includes automated exposure control, adjustment of the mA and/or kV according to patient size and/or use of iterative reconstruction technique. COMPARISON:  Lumbar spine x-ray 06/26/2021 FINDINGS: Segmentation: 5 lumbar type vertebrae. Alignment: Normal. Vertebrae: No acute fracture or focal pathologic process. Bones are osteopenic. Paraspinal and other soft tissues: Negative. Disc levels: There is mild disc space narrowing and endplate osteophyte formation L2-L3 and L3-L4. There are broad-based disc bulges at these levels causing mild central canal stenosis. There is also disc bulge at L4-L5 causing mild central canal stenosis. No significant neural foraminal stenosis identified at any level. IMPRESSION: 1. No acute fracture or traumatic subluxation of the lumbar spine. 2. Mild degenerative changes of the lumbar spine. Electronically Signed   By: [name] M.D.   On: 05/08/2023 22:48   DG Ribs Unilateral W/Chest Right Result Date: 05/08/2023 CLINICAL DATA:  Fall, rib pain on the right EXAM: RIGHT RIBS AND CHEST - 3+ VIEW COMPARISON:  Radiograph and CT 12/13/2022 FINDINGS: Stable cardiomediastinal silhouette. Aortic atherosclerotic calcification. Prominent central pulmonary arteries. Pulmonary vascular congestion. No focal  consolidation, pleural effusion, or pneumothorax. No displaced rib fractures. IMPRESSION: No displaced rib fractures. Electronically Signed   By: Minerva Fester M.D.   On: 05/08/2023 22:35   DG Knee 2 Views Left Result Date: 05/08/2023 CLINICAL DATA:  Fall EXAM: LEFT KNEE - 1-2 VIEW COMPARISON:  Left knee pain 02/28/2023 FINDINGS: The bones are osteopenic. No evidence of fracture, dislocation, or joint effusion. There is mild medial and lateral compartment joint space narrowing and osteophyte formation compatible with degenerative change. Soft tissues are unremarkable. IMPRESSION: 1. No acute fracture or dislocation. 2. Mild degenerative  changes. Electronically Signed   By: [name] M.D.   On: 05/08/2023 22:33    Microbiology: Results for orders placed or performed during the hospital encounter of 06/06/23  Resp panel by RT-PCR (RSV, Flu A&B, Covid) Anterior Nasal Swab     Status: None   Collection Time: 06/06/23  9:28 AM   Specimen: Anterior Nasal Swab  Result Value Ref Range Status   SARS Coronavirus 2 by RT PCR NEGATIVE NEGATIVE Final   Influenza A by PCR NEGATIVE NEGATIVE Final   Influenza B by PCR NEGATIVE NEGATIVE Final    Comment: (NOTE) The Xpert Xpress SARS-CoV-2/FLU/RSV plus assay is intended as an aid in the diagnosis of influenza from Nasopharyngeal swab specimens and should not be used as a sole basis for treatment. Nasal washings and aspirates are unacceptable for Xpert Xpress SARS-CoV-2/FLU/RSV testing.  Fact Sheet for Patients: BloggerCourse.com  Fact Sheet for Healthcare Providers: SeriousBroker.it  This test is not yet approved or cleared by the [place] FDA and has been authorized for detection and/or diagnosis of SARS-CoV-2 by FDA under an Emergency Use Authorization (EUA). This EUA will remain in effect (meaning this test can be used) for the duration of the COVID-19 declaration under Section 564(b)(1) of the Act, 21 U.S.C. section 360bbb-3(b)(1), unless the authorization is terminated or revoked.     Resp Syncytial Virus by PCR NEGATIVE NEGATIVE Final    Comment: (NOTE) Fact Sheet for Patients: BloggerCourse.com  Fact Sheet for Healthcare Providers: SeriousBroker.it  This test is not yet approved or cleared by the [place] FDA and has been authorized for detection and/or diagnosis of SARS-CoV-2 by FDA under an Emergency Use Authorization (EUA). This EUA will remain in effect (meaning this test can be used) for the duration of the COVID-19 declaration under Section  564(b)(1) of the Act, 21 U.S.C. section 360bbb-3(b)(1), unless the authorization is terminated or revoked.  Performed at [facility], [address] [place], [place] [address]    *Note: Due to a large number of results and/or encounters for the requested time period, some results have not been displayed. A complete set of results can be found in Results Review.    Labs: CBC: Recent Labs  Lab 06/06/23 0935 06/07/23 0019  WBC 25.1* 18.6*  NEUTROABS 18.6*  --   HGB 13.1 12.7  HCT 41.2 40.5  MCV 96.5 96.9  PLT 306 304   Basic Metabolic Panel: Recent Labs  Lab 06/06/23 0935 06/07/23 0019  NA 140 138  K 3.8 3.6  CL 94* 94*  CO2 32 28  GLUCOSE 131* 171*  BUN 40* 25*  CREATININE 1.74* 1.00  CALCIUM 7.7* 8.3*   Liver Function Tests: Recent Labs  Lab 06/06/23 0935 06/07/23 0019  AST 19 17  ALT 24 22  ALKPHOS 56 55  BILITOT 0.2 0.6  PROT 6.6 6.6  ALBUMIN 3.7 3.6   CBG: Recent Labs  Lab  06/06/23 2235 06/07/23 0754  GLUCAP 108* 162*    Discharge time spent: {LESS THAN/GREATER ZOXW:96045} 30 minutes.  Signed: Jacquelin Hawking, MD [facility] 06/07/2023

## 2023-06-07 NOTE — Discharge Instructions (Signed)
 Michele Owens,  You were in the hospital with some abdominal pain. This has improved. You had some IV fluids which helped to rehydrate you. Your white blood cell count was a bit high, but is improving. Thankfully, there is no evidence of an infection. Please follow-up with your PCP next week to get lab work to verify your WBC has improved.
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
## 2023-06-07 NOTE — ED Notes (Signed)
 Patient called out for different things went to help her she was very rude and demanding , telling me and the nurse we need to do this and that now !!! She refused getting her blood drawn by the nurse

## 2023-06-09 IMAGING — CT CT MAXILLOFACIAL W/O CM
3 of 6 series · 16 of 47 positions shown, 19 images · non-contrast
Comparison: Head CT 04/18/2020

CLINICAL DATA: Fall

EXAM:
CT MAXILLOFACIAL WITHOUT CONTRAST
TECHNIQUE: Multidetector CT imaging of the maxillofacial structures was
performed. Multiplanar CT image reconstructions were also generated.

[Series 3: maxilllofacial 2.0 hr40 3 · axial · 0.35mm/px · z∈[-252,-118]mm · 11 of 79 slices shown, 14 images]
[im 6/79  brain]
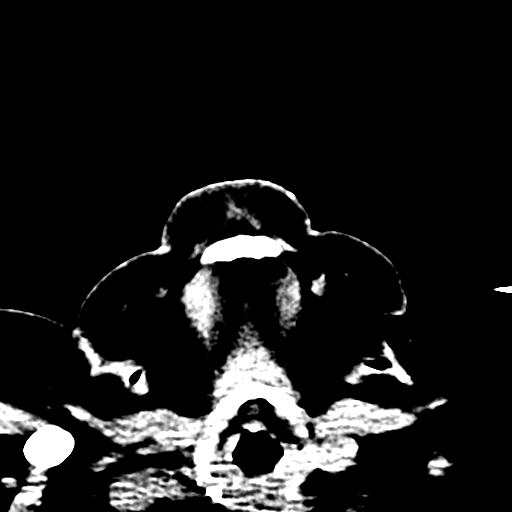
[im 6/79  bone]
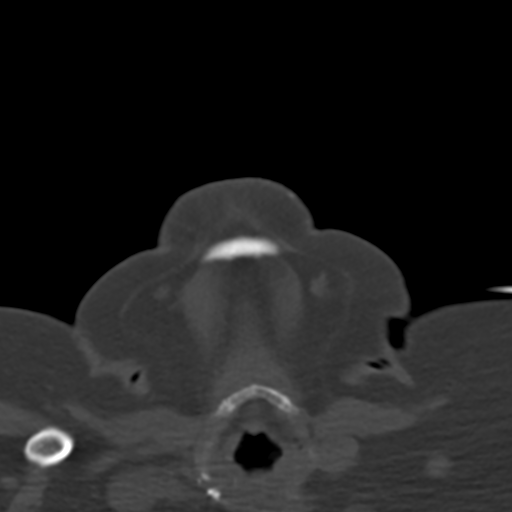
[im 12/79  bone]
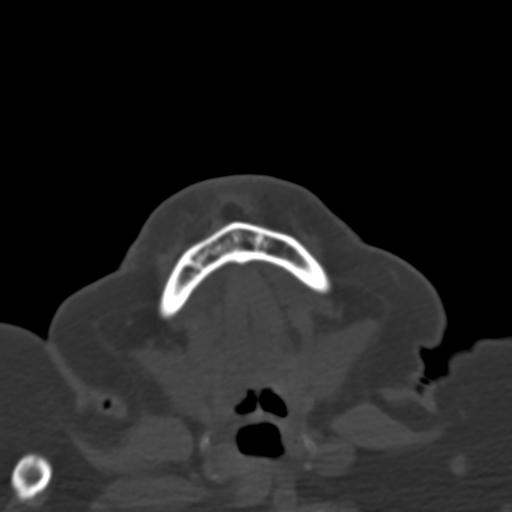
[im 17/79  bone]
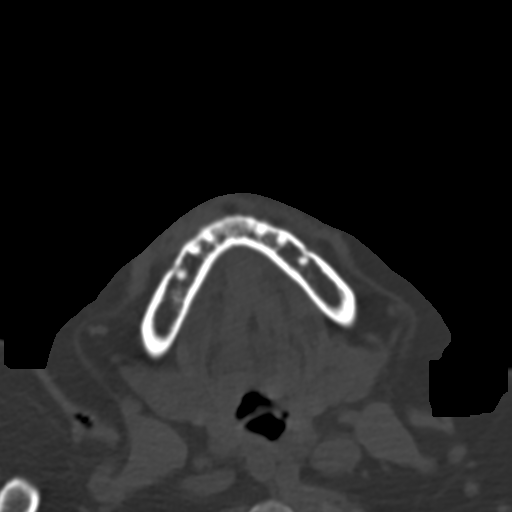
[im 28/79  bone]
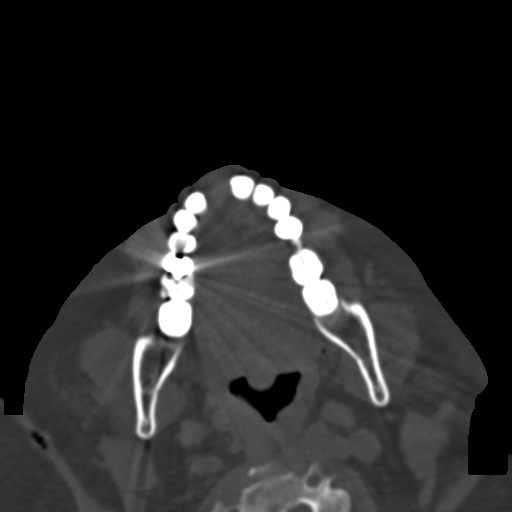
[im 34/79  brain]
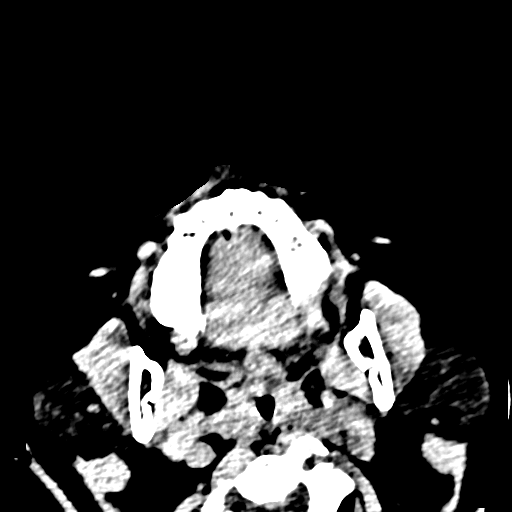
[im 34/79  bone]
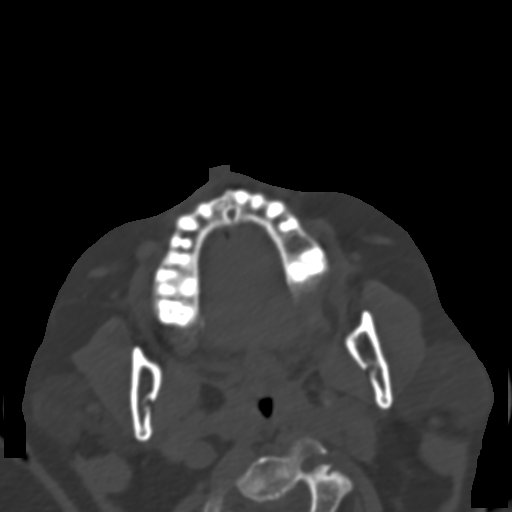
[im 40/79  bone]
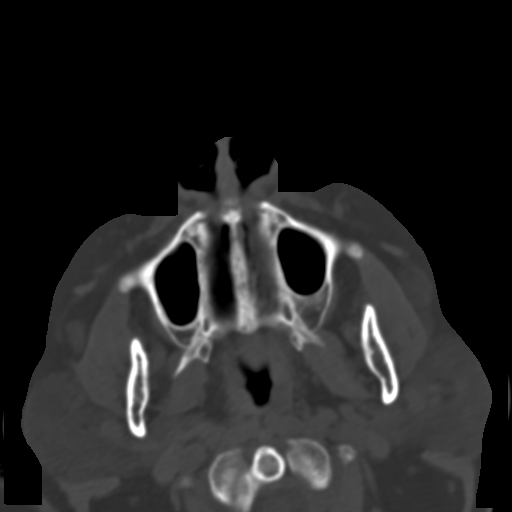
[im 45/79  bone]
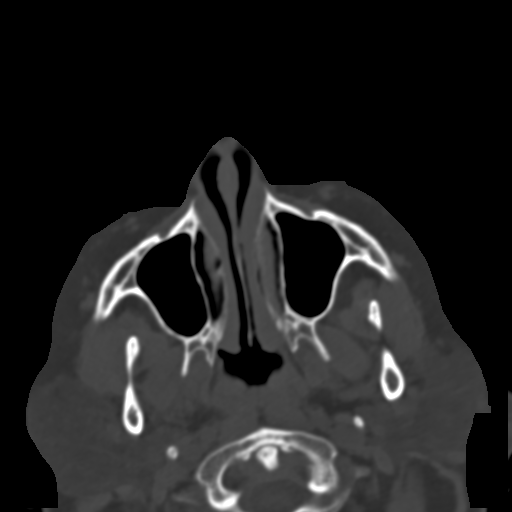
[im 51/79  bone]
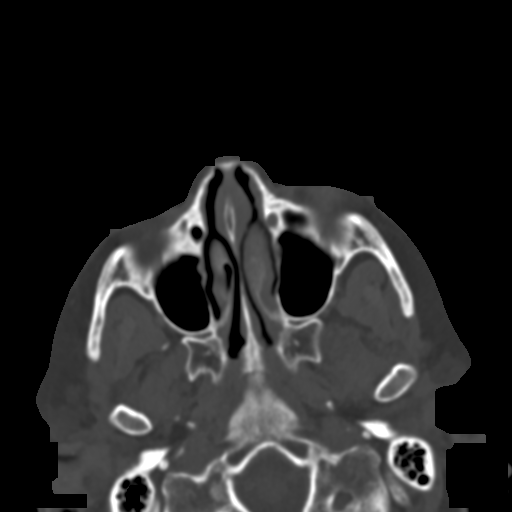
[im 62/79  brain]
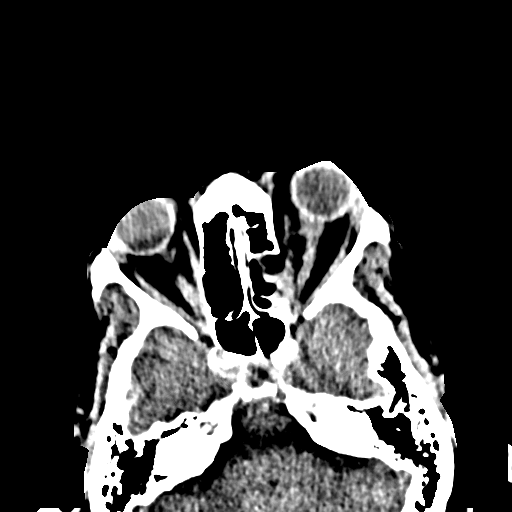
[im 62/79  bone]
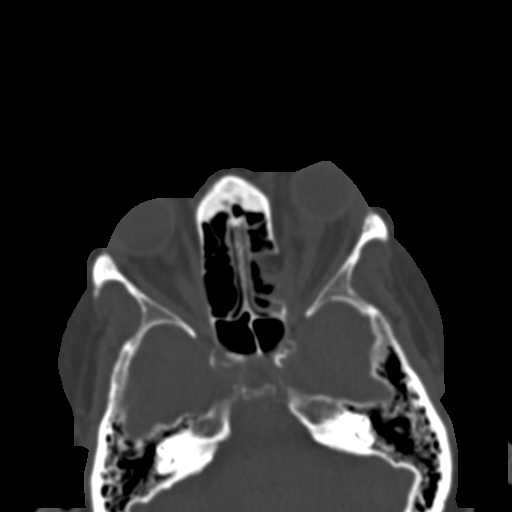
[im 67/79  bone]
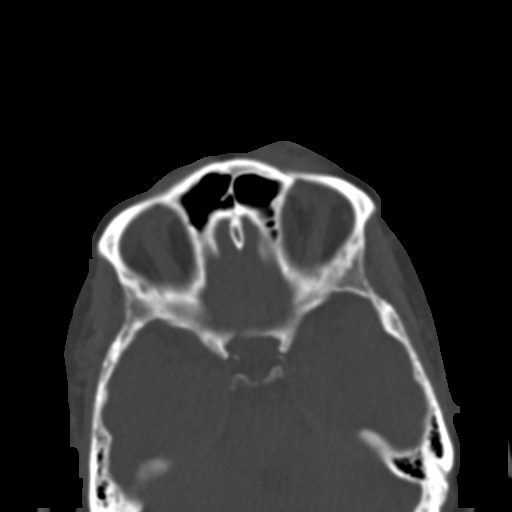
[im 73/79  bone]
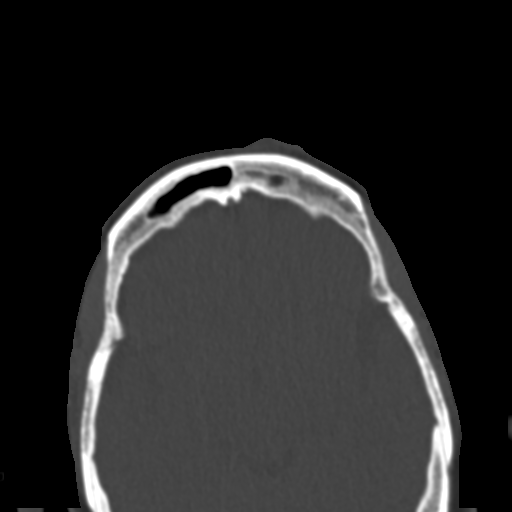

[Series 7: st cor · coronal · 0.33mm/px · 3 of 86 slices shown]
[im 22/86  bone]
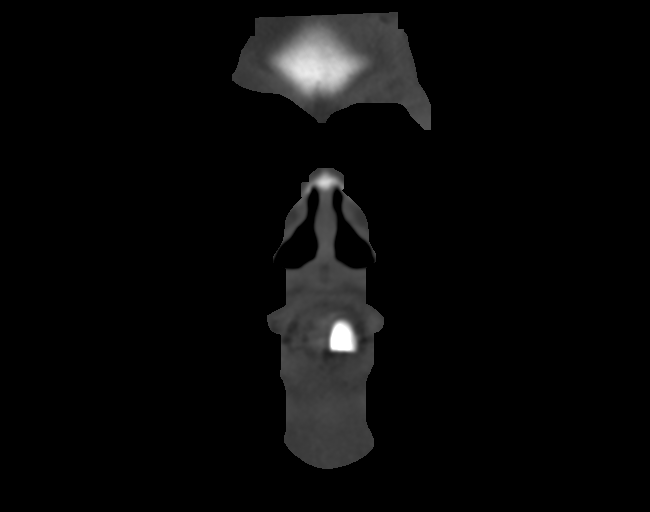
[im 43/86  bone]
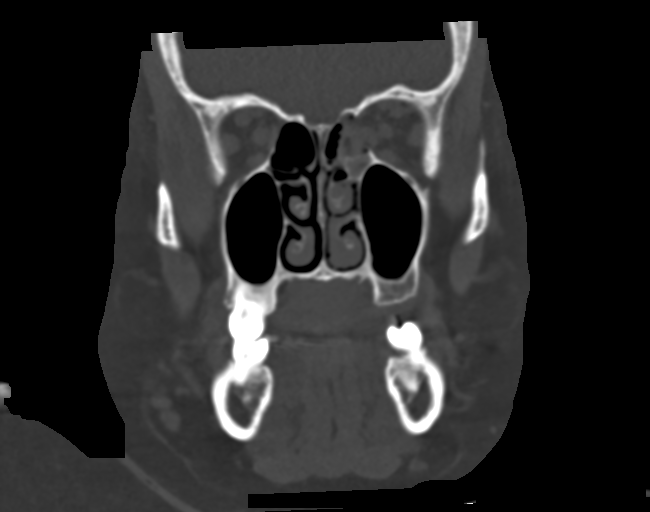
[im 64/86  bone]
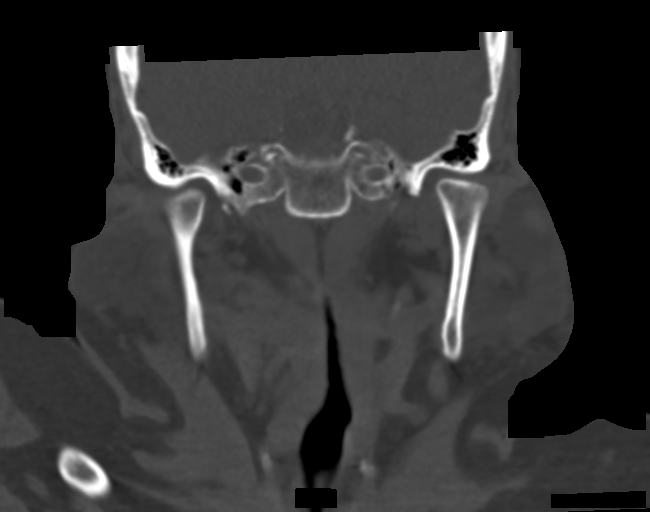

[Series 10: bone sag · sagittal · 0.34mm/px · 2 of 93 slices shown]
[im 31/93  bone]
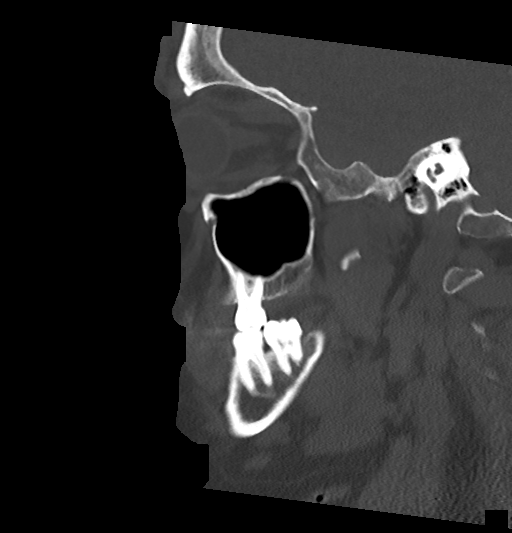
[im 62/93  bone]
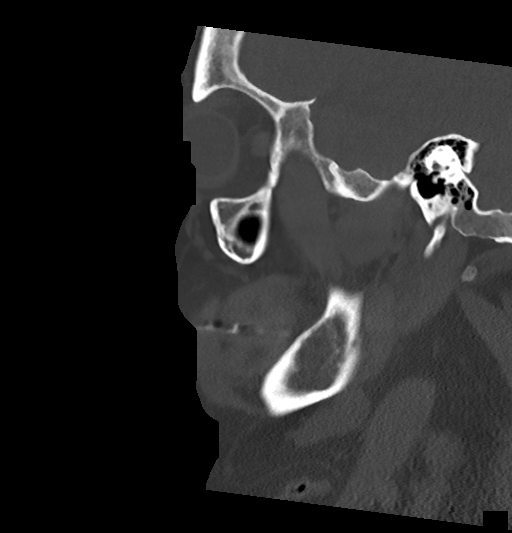

[16 of 47 positions shown; findings below may reference images not displayed]

FINDINGS: Osseous: Zygomatic arches and mandible intact.  No facial fracture.

Orbits: There is a left medial orbital wall blowout fracture. No
other orbital fracture. Globes are intact.

Sinuses: Fluid within the left ethmoid air cells associated with the
left medial orbital wall blowout fracture.

Soft tissues: Soft tissue swelling over the left orbit and forehead.

Limited intracranial: See head CT report.
IMPRESSION: Left medial orbital wall blowout fracture.

## 2023-06-10 ENCOUNTER — Emergency Department (HOSPITAL_COMMUNITY)

## 2023-06-10 ENCOUNTER — Emergency Department (HOSPITAL_COMMUNITY)
Admission: EM | Admit: 2023-06-10 | Discharge: 2023-06-11 | Disposition: A | Discharge status: Home / Self Care | Attending: Emergency Medicine | Admitting: Emergency Medicine

## 2023-06-10 ENCOUNTER — Other Ambulatory Visit: Payer: Self-pay

## 2023-06-10 ENCOUNTER — Encounter (HOSPITAL_COMMUNITY): Payer: Self-pay | Admitting: Emergency Medicine

## 2023-06-10 DIAGNOSIS — R059 Cough, unspecified: Secondary | ICD-10-CM | POA: Insufficient documentation

## 2023-06-10 DIAGNOSIS — R0789 Other chest pain: Secondary | ICD-10-CM | POA: Insufficient documentation

## 2023-06-10 DIAGNOSIS — Z720 Tobacco use: Secondary | ICD-10-CM | POA: Insufficient documentation

## 2023-06-10 DIAGNOSIS — I509 Heart failure, unspecified: Secondary | ICD-10-CM | POA: Diagnosis not present

## 2023-06-10 DIAGNOSIS — I110 Hypertensive heart disease with heart failure: Secondary | ICD-10-CM | POA: Insufficient documentation

## 2023-06-10 DIAGNOSIS — J449 Chronic obstructive pulmonary disease, unspecified: Secondary | ICD-10-CM | POA: Diagnosis not present

## 2023-06-10 DIAGNOSIS — E119 Type 2 diabetes mellitus without complications: Secondary | ICD-10-CM | POA: Insufficient documentation

## 2023-06-10 DIAGNOSIS — R079 Chest pain, unspecified: Secondary | ICD-10-CM

## 2023-06-10 DIAGNOSIS — Z79899 Other long term (current) drug therapy: Secondary | ICD-10-CM | POA: Diagnosis not present

## 2023-06-10 LAB — BASIC METABOLIC PANEL
Anion gap: 12 (ref 5–15)
BUN: 18 mg/dL (ref 8–23)
CO2: 32 mmol/L (ref 22–32)
Calcium: 8.5 mg/dL — ABNORMAL LOW (ref 8.9–10.3)
Chloride: 97 mmol/L — ABNORMAL LOW (ref 98–111)
Creatinine, Ser: 1.06 mg/dL — ABNORMAL HIGH (ref 0.44–1.00)
GFR, Estimated: 59 mL/min — ABNORMAL LOW (ref >60)
Glucose, Bld: 159 mg/dL — ABNORMAL HIGH (ref 70–99)
Potassium: 3.9 mmol/L (ref 3.5–5.1)
Sodium: 141 mmol/L (ref 135–145)

## 2023-06-10 LAB — CBC
HCT: 38 % (ref 36.0–46.0)
Hemoglobin: 11.7 g/dL — ABNORMAL LOW (ref 12.0–15.0)
MCH: 30.3 pg (ref 26.0–34.0)
MCHC: 30.8 g/dL (ref 30.0–36.0)
MCV: 98.4 fL (ref 80.0–100.0)
Platelets: 248 10*3/uL (ref 150–400)
RBC: 3.86 MIL/uL — ABNORMAL LOW (ref 3.87–5.11)
RDW: 13.5 % (ref 11.5–15.5)
WBC: 16.9 10*3/uL — ABNORMAL HIGH (ref 4.0–10.5)
nRBC: 0 % (ref 0.0–0.2)

## 2023-06-10 LAB — RESP PANEL BY RT-PCR (RSV, FLU A&B, COVID)  RVPGX2
Influenza A by PCR: NEGATIVE
Influenza B by PCR: NEGATIVE
Resp Syncytial Virus by PCR: NEGATIVE
SARS Coronavirus 2 by RT PCR: NEGATIVE

## 2023-06-10 LAB — TROPONIN I (HIGH SENSITIVITY)
Troponin I (High Sensitivity): 7 ng/L (ref <18)
Troponin I (High Sensitivity): 7 ng/L (ref <18)

## 2023-06-10 MED ORDER — ALBUTEROL SULFATE HFA 108 (90 BASE) MCG/ACT IN AERS
2.0000 | INHALATION_SPRAY | Freq: Once | RESPIRATORY_TRACT | Status: AC
  Administered 2023-06-10: 2 via RESPIRATORY_TRACT
  Filled 2023-06-10: qty 6.7

## 2023-06-10 MED ORDER — LORAZEPAM 1 MG PO TABS
1.0000 mg | ORAL_TABLET | Freq: Once | ORAL | Status: AC
  Administered 2023-06-10: 1 mg via ORAL
  Filled 2023-06-10: qty 1
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
## 2023-06-10 NOTE — ED Notes (Signed)
 Assumed care of patient. Patient states she needs something for pain. When asked about her chest pain she states "it's very little". Also asked for her "calm down medication". Did not know the name if the medication.

## 2023-06-10 NOTE — Discharge Instructions (Addendum)
 We saw you in the ER for the chest pain/shortness of breath. All of our cardiac workup is normal, including labs, EKG and chest X-RAY are normal. We are not sure what is causing your discomfort, but we feel comfortable sending you home at this time. The workup in the ER is not complete, and you should follow up with your primary care doctor for further evaluation.  Please return to the ER if you have worsening chest pain, shortness of breath, pain radiating to your jaw, shoulder, or back, sweats or fainting. Otherwise see the Cardiologist or your primary care doctor as requested.

## 2023-06-10 NOTE — Hospital Course (Addendum)
 Michele Owens is a 63 y.o. female with a history of COPD, chronic respiratory failure, primary hypertension, morbid obesity, OSA, hyperlipidemia, HFpEF, gout, hypothyroidism, bipolar disorder, diabetes mellitus.  Patient presented secondary to abdominal pain and nausea. Symptoms improved. Hospitalization complicated by AKI which resolved with IV fluids.

## 2023-06-11 MED ORDER — PREDNISONE 10 MG PO TABS: 40.0000 mg | ORAL_TABLET | Freq: Every day | ORAL | 0 refills | Status: DC

## 2023-06-11 MED ORDER — PREDNISONE 20 MG PO TABS
60.0000 mg | ORAL_TABLET | Freq: Once | ORAL | Status: AC
  Administered 2023-06-11: 60 mg via ORAL
  Filled 2023-06-11: qty 3

## 2023-06-11 MED ORDER — DOXYCYCLINE HYCLATE 100 MG PO TABS
100.0000 mg | ORAL_TABLET | Freq: Once | ORAL | Status: AC
  Administered 2023-06-11: 100 mg via ORAL
  Filled 2023-06-11: qty 1

## 2023-06-11 MED ORDER — DOXYCYCLINE HYCLATE 100 MG PO CAPS
100.0000 mg | ORAL_CAPSULE | Freq: Two times a day (BID) | ORAL | 0 refills | Status: DC
Start: 2023-06-11 — End: 2023-12-05

## 2023-06-11 NOTE — ED Provider Notes (Signed)
 [facility] AT [facility] Provider Note   CSN: [id number] Arrival date & time: 06/10/23  2016     History  Chief Complaint  Patient presents with   Chest Pain   Cough    Michele Owens is a 63 y.o. female.  HPI    63 year old female comes in with chief complaint of cough, chest pain.  Patient states that she got into an argument with her nephew, and in the middle of the argument she started having some chest tightness.  Her symptoms are now resolved.  Patient has previous history of OSA, diabetes, chronic hypoxic respiratory failure, tobacco use disorder and CHF.  She states that she did not have any associated shortness of breath, nausea, diaphoresis with the chest pain.  However, she has been having cough and shortness of breath for the last 3 days.  Her cough is producing yellow phlegm.  She has been out of her nebulizer treatment at home.  Home Medications Prior to Admission medications   Medication Sig Start Date End Date Taking? Authorizing Provider  ACETAMINOPHEN PO Take 1,300-2,050 mg by mouth daily as needed for moderate pain (pain score 4-6) or headache.    [provider]  albuterol (VENTOLIN HFA) 108 (90 Base) MCG/ACT inhaler Inhale 1-2 puffs into the lungs every 6 (six) hours as needed for wheezing or shortness of breath. 11/24/22   Laurey Morale, MD  allopurinol (ZYLOPRIM) 100 MG tablet Take 100 mg by mouth daily. Patient not taking: Reported on 06/06/2023    [provider]  aspirin EC 81 MG tablet Take 81 mg by mouth daily. Swallow whole.    [provider]  blood glucose meter kit and supplies KIT Dispense based on patient and insurance preference. Use up to four times daily as directed. 12/25/20   Azucena Fallen, MD  budesonide (PULMICORT) 0.25 MG/2ML nebulizer solution Inhale 2 mLs (0.25 mg total) by nebulization 2 (two) times daily. Patient taking differently: Take 0.25 mg by nebulization daily  as needed (for shortness of breath or wheezing). 11/24/22   Laurey Morale, MD  busPIRone (BUSPAR) 5 MG tablet Take 1 tablet (5 mg total) by mouth 3 (three) times daily. Patient taking differently: Take 10 mg by mouth 3 (three) times daily. 02/07/23   Avanell Shackleton, NP-C  butalbital-acetaminophen-caffeine (FIORICET) [phone] MG tablet Take 1-2 tablets by mouth every 6 (six) hours as needed for headache. 05/08/23 05/07/24  [name], PA-C  calcitRIOL (ROCALTROL) 0.5 MCG capsule Take 1 capsule (0.5 mcg total) by mouth daily. Patient not taking: Reported on 06/07/2023 09/26/16   Elease Etienne, MD  carvedilol (COREG) 6.25 MG tablet Take 1 tablet (6.25 mg total) by mouth 2 (two) times daily. 05/23/23   Jacklynn Ganong, FNP  EPINEPHrine 0.3 mg/0.3 mL IJ SOAJ injection Inject 0.3 mg into the muscle once as needed for anaphylaxis.    [provider]  FARXIGA 10 MG TABS tablet Take 1 tablet (10 mg total) by mouth daily before breakfast. 02/02/23   Laurey Morale, MD  fenofibrate (TRICOR) 145 MG tablet Take 1 tablet (145 mg total) by mouth daily. 02/02/23   Laurey Morale, MD  FEROSUL 325 (65 Fe) MG tablet Take 1 tablet (325 mg total) by mouth daily. 02/09/23   Henson, Vickie L, NP-C  levocetirizine (XYZAL) 5 MG tablet Take 1 tablet (5 mg total) by mouth at bedtime. 11/24/22   Laurey Morale, MD  levothyroxine (SYNTHROID) 125  MCG tablet Take 125 mcg by mouth at bedtime. 05/07/23   [provider]  losartan (COZAAR) 25 MG tablet Take 1 tablet (25 mg total) by mouth daily. 02/02/23   Laurey Morale, MD  nitroGLYCERIN (NITROSTAT) 0.4 MG SL tablet Place 1 tablet (0.4 mg total) under the tongue every 5 (five) minutes as needed for chest pain. 05/23/23   [place], Anderson Malta, FNP  nystatin cream (MYCOSTATIN) Apply 1 Application topically 2 (two) times daily as needed for dry skin. 09/26/22   [provider]  omeprazole (PRILOSEC) 20 MG capsule Take 1 capsule (20 mg total) by  mouth 2 (two) times daily as needed (acid reflux). Patient taking differently: Take 40 mg by mouth daily. 11/24/22   Laurey Morale, MD  ondansetron (ZOFRAN) 4 MG tablet Take 4 mg by mouth every 8 (eight) hours as needed for nausea or vomiting. Patient not taking: Reported on 02/02/2023 01/16/22   [provider]  OXYGEN Inhale 2 L into the lungs continuous.    [provider]  OZEMPIC, 0.25 OR 0.5 MG/DOSE, 2 MG/3ML SOPN Inject 0.5 mg into the skin every Friday. 05/17/23   [provider]  polyethylene glycol (MIRALAX / GLYCOLAX) 17 g packet Take 17 g by mouth daily as needed for mild constipation. Patient not taking: Reported on 02/02/2023 11/24/22   Laurey Morale, MD  potassium chloride (KLOR-CON) 10 MEQ tablet TAKE 1 TABLET (10 MEQ TOTAL) BY MOUTH 2 (TWO) TIMES DAILY. 05/10/23   Laurey Morale, MD  rosuvastatin (CRESTOR) 10 MG tablet Take 1 tablet (10 mg total) by mouth every evening. 02/02/23   Laurey Morale, MD  senna-docusate (SENOKOT-S) 8.6-50 MG tablet Take 1 tablet by mouth daily. Patient not taking: Reported on 06/07/2023 02/07/23   Avanell Shackleton, NP-C  spironolactone (ALDACTONE) 25 MG tablet Take 1 tablet (25 mg total) by mouth daily. 02/02/23   Laurey Morale, MD  torsemide (DEMADEX) 20 MG tablet Take 2 tablets (40 mg total) by mouth 2 (two) times daily. 11/24/22   Laurey Morale, MD  traZODone (DESYREL) 50 MG tablet Take 2 tablets (100 mg total) by mouth at bedtime. Patient not taking: Reported on 06/07/2023 02/07/23   Avanell Shackleton, NP-C  umeclidinium-vilanterol (ANORO ELLIPTA) 62.5-25 MCG/ACT AEPB Inhale 1 puff into the lungs daily. 02/09/23   Henson, Vickie L, NP-C  Vitamin D, Ergocalciferol, (DRISDOL) 1.25 MG (50000 UNIT) CAPS capsule Take 50,000 Units by mouth once a week. 06/05/23   [provider]  zolpidem (AMBIEN) 10 MG tablet Take 10 mg by mouth at bedtime.    [provider]      Allergies    Bee venom, Fioricet  [butalbital-apap-caffeine], Ibuprofen, Lamisil [terbinafine], and Nsaids    Review of Systems   Review of Systems  All other systems reviewed and are negative.   Physical Exam Updated Vital Signs BP (!) 160/77 (BP Location: Left Arm)   Pulse 80   Temp 97.9 F (36.6 C) (Oral)   Resp 17   Ht 5\' 5"  (1.651 m)   Wt (!) 141 kg   SpO2 95%   BMI 51.73 kg/m  Physical Exam Vitals and nursing note reviewed.  HENT:     Head: Atraumatic.  Cardiovascular:     Rate and Rhythm: Normal rate.     Heart sounds: Normal heart sounds.  Pulmonary:     Effort: Pulmonary effort is normal.     Breath sounds: Decreased breath sounds present. No wheezing, rhonchi  or rales.  Musculoskeletal:     Cervical back: Neck supple.  Skin:    General: Skin is warm and dry.  Neurological:     Mental Status: She is alert and oriented to person, place, and time.     ED Results / Procedures / Treatments   Labs (all labs ordered are listed, but only abnormal results are displayed) Labs Reviewed  BASIC METABOLIC PANEL - Abnormal; Notable for the following components:      Result Value   Chloride 97 (*)    Glucose, Bld 159 (*)    Creatinine, Ser 1.06 (*)    Calcium 8.5 (*)    GFR, Estimated 59 (*)    All other components within normal limits  CBC - Abnormal; Notable for the following components:   WBC 16.9 (*)    RBC 3.86 (*)    Hemoglobin 11.7 (*)    All other components within normal limits  RESP PANEL BY RT-PCR (RSV, FLU A&B, COVID)  [id number]  TROPONIN I (HIGH SENSITIVITY)  TROPONIN I (HIGH SENSITIVITY)    EKG EKG Interpretation Date/Time:  Sunday June 10 2023 20:36:22 EST Ventricular Rate:  77 PR Interval:  158 QRS Duration:  68 QT Interval:  310 QTC Calculation: 350 R Axis:   53  Text Interpretation: Normal sinus rhythm Possible Anterior infarct , age undetermined ST & T wave abnormality, consider lateral ischemia Abnormal ECG When compared with ECG of 07-Jun-2023 01:46, PREVIOUS ECG IS  PRESENT Confirmed by Derwood Kaplan [phone]) on 06/10/2023 10:39:11 PM  Radiology DG Chest 2 View Result Date: 06/10/2023 CLINICAL DATA:  Chest pain and shortness of breath EXAM: CHEST - 2 VIEW COMPARISON:  06/06/2023 FINDINGS: Cardiac shadow is enlarged but stable. Mild vascular congestion is again seen. No focal infiltrate or effusion is noted. No bony abnormality is seen. IMPRESSION: Mild vascular congestion. Electronically Signed   By: Alcide Clever M.D.   On: 06/10/2023 21:20    Procedures Procedures    Medications Ordered in ED Medications  albuterol (VENTOLIN HFA) 108 (90 Base) MCG/ACT inhaler 2 puff (2 puffs Inhalation Given 06/10/23 2254)  LORazepam (ATIVAN) tablet 1 mg (1 mg Oral Given 06/10/23 2255)    ED Course/ Medical Decision Making/ A&P                                 Medical Decision Making Amount and/or Complexity of Data Reviewed Labs: ordered. Radiology: ordered.  Risk Prescription drug management.   This patient presents to the ED with chief complaint(s) of chest tightness but also shob/cough with pertinent past medical history of diabetes, hypertension, COPD, chronic hypoxic respiratory failure, pulm hypertension.The complaint involves an extensive differential diagnosis and also carries with it a high risk of complications and morbidity.    The differential diagnosis includes : Acute coronary syndrome, COPD exacerbation, pneumonia, viral illness, musculoskeletal pain, anxiety/panic attack.  The initial plan is to get basic labs, chest x-ray.   Independent labs interpretation:  The following labs were independently interpreted: COVID-19, flu test is negative.  Troponin x 2 negative.  Independent visualization and interpretation of imaging: - I independently visualized the following imaging with scope of interpretation limited to determining acute life threatening conditions related to emergency care: X-ray of the chest, which revealed no evidence of  pneumonia.  Treatment and Reassessment: The patient appears reasonably screened and/or stabilized for discharge and I doubt any other medical condition or other [facility] requiring  further screening, evaluation, or treatment in the ED at this time prior to discharge.   Results from the ER workup discussed with the patient face to face and all questions answered to the best of my ability. The patient is safe for discharge with strict return precautions.   Final Clinical Impression(s) / ED Diagnoses Final diagnoses:  Nonspecific chest pain    Rx / DC Orders ED Discharge Orders     None         Derwood Kaplan, MD 06/11/23 [phone]

## 2023-06-11 NOTE — ED Notes (Addendum)
 [facility] called for transport.

## 2023-06-12 ENCOUNTER — Other Ambulatory Visit: Payer: Self-pay

## 2023-06-12 ENCOUNTER — Emergency Department (HOSPITAL_COMMUNITY)
Admission: EM | Admit: 2023-06-12 | Discharge: 2023-06-12 | Disposition: A | Discharge status: Home / Self Care | Attending: Student | Admitting: Student

## 2023-06-12 DIAGNOSIS — E1165 Type 2 diabetes mellitus with hyperglycemia: Secondary | ICD-10-CM | POA: Insufficient documentation

## 2023-06-12 DIAGNOSIS — E039 Hypothyroidism, unspecified: Secondary | ICD-10-CM | POA: Diagnosis not present

## 2023-06-12 DIAGNOSIS — Z85850 Personal history of malignant neoplasm of thyroid: Secondary | ICD-10-CM | POA: Insufficient documentation

## 2023-06-12 DIAGNOSIS — I110 Hypertensive heart disease with heart failure: Secondary | ICD-10-CM | POA: Insufficient documentation

## 2023-06-12 DIAGNOSIS — J449 Chronic obstructive pulmonary disease, unspecified: Secondary | ICD-10-CM | POA: Diagnosis not present

## 2023-06-12 DIAGNOSIS — Z7982 Long term (current) use of aspirin: Secondary | ICD-10-CM | POA: Insufficient documentation

## 2023-06-12 DIAGNOSIS — I5033 Acute on chronic diastolic (congestive) heart failure: Secondary | ICD-10-CM | POA: Diagnosis not present

## 2023-06-12 DIAGNOSIS — R739 Hyperglycemia, unspecified: Secondary | ICD-10-CM | POA: Diagnosis present

## 2023-06-12 DIAGNOSIS — R519 Headache, unspecified: Secondary | ICD-10-CM | POA: Insufficient documentation

## 2023-06-12 DIAGNOSIS — Z79899 Other long term (current) drug therapy: Secondary | ICD-10-CM | POA: Diagnosis not present

## 2023-06-12 DIAGNOSIS — J45909 Unspecified asthma, uncomplicated: Secondary | ICD-10-CM | POA: Insufficient documentation

## 2023-06-12 DIAGNOSIS — Z87891 Personal history of nicotine dependence: Secondary | ICD-10-CM | POA: Insufficient documentation

## 2023-06-12 LAB — CBG MONITORING, ED
Glucose-Capillary: 215 mg/dL — ABNORMAL HIGH (ref 70–99)
Glucose-Capillary: 156 mg/dL — ABNORMAL HIGH (ref 70–99)

## 2023-06-12 MED ORDER — BUTALBITAL-APAP-CAFFEINE 50-325-40 MG PO TABS
1.0000 | ORAL_TABLET | Freq: Once | ORAL | Status: AC
  Administered 2023-06-12: 1 via ORAL
  Filled 2023-06-12: qty 1

## 2023-06-12 NOTE — ED Triage Notes (Signed)
 Pt BIBA from home. C/o hyperglycemia x1day. CBG 286. Wears 2L at home.   146/76- HR 85- RR 16- 94% 2L

## 2023-06-12 NOTE — ED Provider Notes (Signed)
 [place] EMERGENCY DEPARTMENT AT [facility] Provider Note  CSN: [id number] Arrival date & time: 06/12/23 9147  Chief Complaint(s) Hyperglycemia  HPI Michele Owens is a 63 y.o. female with PMH COPD on 2 L home O2, CHF, bipolar disorder, OSA, migraine disorder who presents emergency room for evaluation of hyperglycemia.  States that she ran out of her migraine medication and started to get a headache.  She checked her blood sugar and found it to be 286 at home.  Arrives with complaints of headache but denies chest pain, shortness of breath, abdominal pain, nausea, vomiting or other systemic symptoms.  Of note, patient was seen yesterday for chest pain workup and discharged with steroids.   Past Medical History Past Medical History:  Diagnosis Date   Anxiety    Arthritis    Asthma    Chronic diastolic CHF (congestive heart failure) (HCC)    COPD (chronic obstructive pulmonary disease) (HCC)    Uses Oxygen at night   Depression    Diabetes mellitus without complication (HCC)    GERD (gastroesophageal reflux disease)    Gout    Headache    migraines   History of DVT of lower extremity    Hypertension    Morbid obesity with BMI of 45.0-49.9, adult (HCC)    Thyroid cancer (HCC) 09/23/2016   Patient Active Problem List   Diagnosis Date Noted   History of COPD 06/06/2023   Bipolar disorder (HCC) 06/06/2023   At risk for polypharmacy 02/07/2023   Oxygen dependent 02/07/2023   Acute respiratory failure with hypercapnia (HCC) 12/14/2022   Delirium due to multiple etiologies, acute, hyperactive 12/14/2022   Acute respiratory failure with hypoxia and hypercapnia (HCC) 10/25/2022   Acute on chronic diastolic CHF (congestive heart failure) (HCC) 10/23/2022   Current mild episode of major depressive disorder without prior episode (HCC) 06/06/2022   Fever 06/04/2022   Acute on chronic diastolic (congestive) heart failure (HCC) 05/16/2022   Acute kidney injury superimposed  on chronic kidney disease (HCC) 02/01/2022   Hypothyroidism 02/01/2022   Chronic pain disorder 02/01/2022   Respiratory failure with hypercapnia (HCC) 12/23/2020   Anemia 12/23/2020   Diabetic peripheral neuropathy (HCC) 08/18/2020   Acute on chronic respiratory failure with hypoxia (HCC) 04/18/2020   Hemoptysis 04/18/2020   Acute encephalopathy 04/18/2020   Chronic diastolic CHF (congestive heart failure) (HCC) 04/18/2020   Sepsis (HCC) 04/18/2020   Severe obesity (BMI >= 40) (HCC)    Fall at home, initial encounter 06/23/2018   HNP (herniated nucleus pulposus), thoracic 06/23/2018   CHF (congestive heart failure) (HCC) 06/18/2018   Essential hypertension 08/28/2017   Obesity 08/28/2017   COPD exacerbation (HCC) 08/04/2017   Acute and chronic respiratory failure with hypercapnia (HCC)    Pulmonary HTN (HCC)    Genetic testing 02/27/2017   Family history of thyroid cancer    Hypomagnesemia 10/03/2016   Atypical chest pain    HLD (hyperlipidemia) 09/23/2016   COPD (chronic obstructive pulmonary disease) (HCC) 09/23/2016   Gout 09/23/2016   DVT (deep vein thrombosis) in pregnancy 09/23/2016   Thyroid cancer (HCC) 09/23/2016   Hypocalcemia 09/23/2016   Acute pulmonary edema (HCC)    Acute hypoxemic respiratory failure (HCC)    Chronic hypoxic respiratory failure (HCC) 07/16/2016   CAP (community acquired pneumonia) 01/23/2016   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 01/23/2016   Pulmonary hypertension (HCC) 01/23/2016   Obesity hypoventilation syndrome (HCC) 08/26/2015   Chest pain 06/16/2015   Non-insulin dependent type  2 diabetes mellitus (HCC) 04/24/2015   Controlled type 2 diabetes mellitus without complication, without long-term current use of insulin (HCC) 04/24/2015   Depression with anxiety 04/24/2015   Benign essential HTN 04/24/2015   Normocytic anemia 04/24/2015   Anxiety 04/24/2015   OSA (obstructive sleep apnea) 05/10/2012   Tobacco abuse  07/04/2011   Home Medication(s) Prior to Admission medications   Medication Sig Start Date End Date Taking? Authorizing Provider  ACETAMINOPHEN PO Take 1,300-2,050 mg by mouth daily as needed for moderate pain (pain score 4-6) or headache.    [provider]  albuterol (VENTOLIN HFA) 108 (90 Base) MCG/ACT inhaler Inhale 1-2 puffs into the lungs every 6 (six) hours as needed for wheezing or shortness of breath. 11/24/22   Laurey Morale, MD  allopurinol (ZYLOPRIM) 100 MG tablet Take 100 mg by mouth daily. Patient not taking: Reported on 06/06/2023    [provider]  aspirin EC 81 MG tablet Take 81 mg by mouth daily. Swallow whole.    [provider]  blood glucose meter kit and supplies KIT Dispense based on patient and insurance preference. Use up to four times daily as directed. 12/25/20   Azucena Fallen, MD  budesonide (PULMICORT) 0.25 MG/2ML nebulizer solution Inhale 2 mLs (0.25 mg total) by nebulization 2 (two) times daily. Patient taking differently: Take 0.25 mg by nebulization daily as needed (for shortness of breath or wheezing). 11/24/22   Laurey Morale, MD  busPIRone (BUSPAR) 5 MG tablet Take 1 tablet (5 mg total) by mouth 3 (three) times daily. Patient taking differently: Take 10 mg by mouth 3 (three) times daily. 02/07/23   Avanell Shackleton, NP-C  butalbital-acetaminophen-caffeine (FIORICET) [phone] MG tablet Take 1-2 tablets by mouth every 6 (six) hours as needed for headache. 05/08/23 05/07/24  [name], PA-C  calcitRIOL (ROCALTROL) 0.5 MCG capsule Take 1 capsule (0.5 mcg total) by mouth daily. Patient not taking: Reported on 06/07/2023 09/26/16   Elease Etienne, MD  carvedilol (COREG) 6.25 MG tablet Take 1 tablet (6.25 mg total) by mouth 2 (two) times daily. 05/23/23   Milford, Anderson Malta, FNP  doxycycline (VIBRAMYCIN) 100 MG capsule Take 1 capsule (100 mg total) by mouth 2 (two) times daily. 06/11/23   Derwood Kaplan, MD  EPINEPHrine 0.3  mg/0.3 mL IJ SOAJ injection Inject 0.3 mg into the muscle once as needed for anaphylaxis.    [provider]  FARXIGA 10 MG TABS tablet Take 1 tablet (10 mg total) by mouth daily before breakfast. 02/02/23   Laurey Morale, MD  fenofibrate (TRICOR) 145 MG tablet Take 1 tablet (145 mg total) by mouth daily. 02/02/23   Laurey Morale, MD  FEROSUL 325 (65 Fe) MG tablet Take 1 tablet (325 mg total) by mouth daily. 02/09/23   Henson, Vickie L, NP-C  levocetirizine (XYZAL) 5 MG tablet Take 1 tablet (5 mg total) by mouth at bedtime. 11/24/22   Laurey Morale, MD  levothyroxine (SYNTHROID) 125 MCG tablet Take 125 mcg by mouth at bedtime. 05/07/23   [provider]  losartan (COZAAR) 25 MG tablet Take 1 tablet (25 mg total) by mouth daily. 02/02/23   Laurey Morale, MD  nitroGLYCERIN (NITROSTAT) 0.4 MG SL tablet Place 1 tablet (0.4 mg total) under the tongue every 5 (five) minutes as needed for chest pain. 05/23/23   Milford, Anderson Malta, FNP  nystatin cream (MYCOSTATIN) Apply 1 Application topically 2 (two) times daily as needed for dry skin. 09/26/22  [provider]  omeprazole (PRILOSEC) 20 MG capsule Take 1 capsule (20 mg total) by mouth 2 (two) times daily as needed (acid reflux). Patient taking differently: Take 40 mg by mouth daily. 11/24/22   Laurey Morale, MD  ondansetron (ZOFRAN) 4 MG tablet Take 4 mg by mouth every 8 (eight) hours as needed for nausea or vomiting. Patient not taking: Reported on 02/02/2023 01/16/22   [provider]  OXYGEN Inhale 2 L into the lungs continuous.    [provider]  OZEMPIC, 0.25 OR 0.5 MG/DOSE, 2 MG/3ML SOPN Inject 0.5 mg into the skin every Friday. 05/17/23   [provider]  polyethylene glycol (MIRALAX / GLYCOLAX) 17 g packet Take 17 g by mouth daily as needed for mild constipation. Patient not taking: Reported on 02/02/2023 11/24/22   Laurey Morale, MD  potassium chloride (KLOR-CON) 10 MEQ tablet TAKE 1  TABLET (10 MEQ TOTAL) BY MOUTH 2 (TWO) TIMES DAILY. 05/10/23   Laurey Morale, MD  predniSONE (DELTASONE) 10 MG tablet Take 4 tablets (40 mg total) by mouth daily. 06/11/23   Derwood Kaplan, MD  rosuvastatin (CRESTOR) 10 MG tablet Take 1 tablet (10 mg total) by mouth every evening. 02/02/23   Laurey Morale, MD  senna-docusate (SENOKOT-S) 8.6-50 MG tablet Take 1 tablet by mouth daily. Patient not taking: Reported on 06/07/2023 02/07/23   Avanell Shackleton, NP-C  spironolactone (ALDACTONE) 25 MG tablet Take 1 tablet (25 mg total) by mouth daily. 02/02/23   Laurey Morale, MD  torsemide (DEMADEX) 20 MG tablet Take 2 tablets (40 mg total) by mouth 2 (two) times daily. 11/24/22   Laurey Morale, MD  traZODone (DESYREL) 50 MG tablet Take 2 tablets (100 mg total) by mouth at bedtime. Patient not taking: Reported on 06/07/2023 02/07/23   Avanell Shackleton, NP-C  umeclidinium-vilanterol (ANORO ELLIPTA) 62.5-25 MCG/ACT AEPB Inhale 1 puff into the lungs daily. 02/09/23   Henson, Vickie L, NP-C  Vitamin D, Ergocalciferol, (DRISDOL) 1.25 MG (50000 UNIT) CAPS capsule Take 50,000 Units by mouth once a week. 06/05/23   [provider]  zolpidem (AMBIEN) 10 MG tablet Take 10 mg by mouth at bedtime.    [provider]                                                                                                                                    Past Surgical History Past Surgical History:  Procedure Laterality Date   BUNIONECTOMY Bilateral    CARDIAC CATHETERIZATION N/A 06/23/2015   Procedure: Right/Left Heart Cath and Coronary Angiography;  Surgeon: Laurey Morale, MD;  Location: [facility] INVASIVE CV LAB;  Service: Cardiovascular;  Laterality: N/A;   COLONOSCOPY WITH PROPOFOL N/A 12/15/2015   Procedure: COLONOSCOPY WITH PROPOFOL;  Surgeon: Dorena Cookey, MD;  Location: [facility] ENDOSCOPY;  Service: Endoscopy;  Laterality: N/A;   RIGHT HEART CATH N/A 10/01/2019   Procedure: RIGHT  HEART CATH;  Surgeon: Laurey Morale, MD;  Location: [facility] INVASIVE CV LAB;  Service: Cardiovascular;  Laterality: N/A;   THYROIDECTOMY  09/19/2016   THYROIDECTOMY N/A 09/19/2016   Procedure: TOTAL THYROIDECTOMY;  Surgeon: Darnell Level, MD;  Location: [facility] OR;  Service: General;  Laterality: N/A;   TONSILLECTOMY     TOTAL ABDOMINAL HYSTERECTOMY  07/14/10   Family History Family History  Problem Relation Age of Onset   Cancer Father        thought to be due to exposure to concrete   Diabetes Mother    Thyroid cancer Mother        dx in her 108s-60s   Cancer Maternal Uncle        2 uncles with cancer NOS   Brain cancer Paternal Aunt    Cancer Cousin        maternal first cousin - NOS   Cancer Cousin        maternal first cousin - NOS    Social History Social History   Tobacco Use   Smoking status: Former    Current packs/day: 0.00    Average packs/day: 0.5 packs/day for 41.0 years (20.5 ttl pk-yrs)    Types: Cigarettes    Start date: 07/1975    Quit date: 07/2016    Years since quitting: 6.9   Smokeless tobacco: Never  Vaping Use   Vaping status: Never Used  Substance Use Topics   Alcohol use: Not Currently    Comment: quit 12 years ago   Drug use: Not Currently    Types: Marijuana, Cocaine    Comment: quit 12 years ago   Allergies Bee venom, Fioricet [butalbital-apap-caffeine], Ibuprofen, Lamisil [terbinafine], and Nsaids  Review of Systems Review of Systems  Neurological:  Positive for headaches.    Physical Exam Vital Signs  I have reviewed the triage vital signs BP 127/70 (BP Location: Right Arm)   Pulse 79   Temp 98.5 F (36.9 C) (Oral)   Resp 18   SpO2 99%   Physical Exam Vitals and nursing note reviewed.  Constitutional:      General: She is not in acute distress.    Appearance: She is well-developed.  HENT:     Head: Normocephalic and atraumatic.  Eyes:     Conjunctiva/sclera: Conjunctivae normal.  Cardiovascular:     Rate and Rhythm: Normal rate and regular rhythm.      Heart sounds: No murmur heard. Pulmonary:     Effort: Pulmonary effort is normal. No respiratory distress.     Breath sounds: Normal breath sounds.  Abdominal:     Palpations: Abdomen is soft.     Tenderness: There is no abdominal tenderness.  Musculoskeletal:        General: No swelling.     Cervical back: Neck supple.  Skin:    General: Skin is warm and dry.     Capillary Refill: Capillary refill takes less than 2 seconds.  Neurological:     Mental Status: She is alert.     Cranial Nerves: No cranial nerve deficit.     Sensory: No sensory deficit.     Motor: No weakness.  Psychiatric:        Mood and Affect: Mood normal.     ED Results and Treatments Labs (all labs ordered are listed, but only abnormal results are displayed) Labs Reviewed  CBG MONITORING, ED - Abnormal; Notable for the following components:      Result Value   Glucose-Capillary  215 (*)    All other components within normal limits  CBG MONITORING, ED - Abnormal; Notable for the following components:   Glucose-Capillary 156 (*)    All other components within normal limits                                                                                                                          Radiology No results found.  Pertinent labs & imaging results that were available during my care of the patient were reviewed by me and considered in my medical decision making (see MDM for details).  Medications Ordered in ED Medications  butalbital-acetaminophen-caffeine (FIORICET) 50-325-40 MG per tablet 1 tablet (1 tablet Oral Given 06/12/23 0225)                                                                                                                                     Procedures Procedures  (including critical care time)  Medical Decision Making / ED Course   This patient presents to the ED for concern of hyperglycemia, headache, this involves an extensive number of treatment options, and is a  complaint that carries with it a high risk of complications and morbidity.  The differential diagnosis includes medication induced hyperglycemia from steroid use, DKA, HHS, migraine, viral illness, electrolyte abnormality  MDM: Patient seen in the emergency room for evaluation of hyperglycemia and headache.  Physical exam is unremarkable with no focal motor or sensory deficits, no cranial nerve deficits.  Glucose 215 on arrival.  She states that she has run out of her Fioricet and was given a dose of Fioricet here in the emergency department.  On reevaluation her symptoms have resolved and she is feeling significant improved.  Her mild hyperglycemia is likely secondary to her steroid use and I have very low suspicion for DKA or HHS today especially as repeat glucose 156 without intervention.  She had a full workup yesterday that was overall reassuring and at this time we will not pursue additional workup as this likely would not change management especially in the setting of recent normal workup.  With symptoms resolved she will be discharged with outpatient follow-up.    Additional history obtained:  -External records from outside source obtained and reviewed including: Chart review including previous notes, labs, imaging, consultation notes   Lab Tests: -I ordered, reviewed, and interpreted labs.   The pertinent results  include:   Labs Reviewed  CBG MONITORING, ED - Abnormal; Notable for the following components:      Result Value   Glucose-Capillary 215 (*)    All other components within normal limits  CBG MONITORING, ED - Abnormal; Notable for the following components:   Glucose-Capillary 156 (*)    All other components within normal limits       Medicines ordered and prescription drug management: Meds ordered this encounter  Medications   butalbital-acetaminophen-caffeine (FIORICET) 50-325-40 MG per tablet 1 tablet    -I have reviewed the patients home medicines and have made  adjustments as needed  Critical interventions none   Cardiac Monitoring: The patient was maintained on a cardiac monitor.  I personally viewed and interpreted the cardiac monitored which showed an underlying rhythm of: NSR  Social Determinants of Health:  Factors impacting patients care include: none   Reevaluation: After the interventions noted above, I reevaluated the patient and found that they have :improved  Co morbidities that complicate the patient evaluation  Past Medical History:  Diagnosis Date   Anxiety    Arthritis    Asthma    Chronic diastolic CHF (congestive heart failure) (HCC)    COPD (chronic obstructive pulmonary disease) (HCC)    Uses Oxygen at night   Depression    Diabetes mellitus without complication (HCC)    GERD (gastroesophageal reflux disease)    Gout    Headache    migraines   History of DVT of lower extremity    Hypertension    Morbid obesity with BMI of 45.0-49.9, adult (HCC)    Thyroid cancer (HCC) 09/23/2016      Dispostion: I considered admission for this patient, but at this time she does not meet inpatient criteria for admission and will be discharged with outpatient follow-up     Final Clinical Impression(s) / ED Diagnoses Final diagnoses:  Hyperglycemia  Acute nonintractable headache, unspecified headache type     @PCDICTATION @    Glendora Score, MD 06/12/23 [phone]

## 2023-06-12 NOTE — ED Notes (Signed)
PT[facility] called for pt transport. 

## 2023-06-17 ENCOUNTER — Encounter (HOSPITAL_COMMUNITY): Payer: Self-pay

## 2023-06-17 ENCOUNTER — Emergency Department (HOSPITAL_COMMUNITY)
Admission: EM | Admit: 2023-06-17 | Discharge: 2023-06-17 | Disposition: A | Discharge status: Home / Self Care | Attending: Emergency Medicine | Admitting: Emergency Medicine

## 2023-06-17 DIAGNOSIS — Z7951 Long term (current) use of inhaled steroids: Secondary | ICD-10-CM | POA: Diagnosis not present

## 2023-06-17 DIAGNOSIS — J449 Chronic obstructive pulmonary disease, unspecified: Secondary | ICD-10-CM | POA: Diagnosis not present

## 2023-06-17 DIAGNOSIS — M10072 Idiopathic gout, left ankle and foot: Secondary | ICD-10-CM | POA: Diagnosis not present

## 2023-06-17 DIAGNOSIS — Z7982 Long term (current) use of aspirin: Secondary | ICD-10-CM | POA: Insufficient documentation

## 2023-06-17 DIAGNOSIS — M79672 Pain in left foot: Secondary | ICD-10-CM | POA: Diagnosis present

## 2023-06-17 DIAGNOSIS — I509 Heart failure, unspecified: Secondary | ICD-10-CM | POA: Diagnosis not present

## 2023-06-17 DIAGNOSIS — Z79899 Other long term (current) drug therapy: Secondary | ICD-10-CM | POA: Diagnosis not present

## 2023-06-17 DIAGNOSIS — M79671 Pain in right foot: Secondary | ICD-10-CM

## 2023-06-17 DIAGNOSIS — E1140 Type 2 diabetes mellitus with diabetic neuropathy, unspecified: Secondary | ICD-10-CM | POA: Diagnosis not present

## 2023-06-17 MED ORDER — HYDROCODONE-ACETAMINOPHEN 5-325 MG PO TABS
1.0000 | ORAL_TABLET | Freq: Once | ORAL | Status: AC
  Administered 2023-06-17: 1 via ORAL
  Filled 2023-06-17: qty 1

## 2023-06-17 NOTE — ED Triage Notes (Signed)
 Pt comes via [facility] for gout pain in both feet

## 2023-06-17 NOTE — ED Provider Notes (Signed)
 [place] EMERGENCY DEPARTMENT AT [facility] Provider Note   CSN: [id number] Arrival date & time: 06/17/23  0354     History  Chief Complaint  Patient presents with   Gout    Michele Owens is a 63 y.o. female with a PMH COPD on 2 L home O2, CHF, bipolar disorder, OSA, migraine disorder diabetes, diabetic peripheral neuropathy and obesity. Patient reports bilateral foot pain worse on the right.  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  Patient reports she got her "gout pills" 3 days ago.  He is complaining of burning pain in the entire foot.  HPI     Home Medications Prior to Admission medications   Medication Sig Start Date End Date Taking? Authorizing Provider  ACETAMINOPHEN PO Take 1,300-2,050 mg by mouth daily as needed for moderate pain (pain score 4-6) or headache.    [provider]  albuterol (VENTOLIN HFA) 108 (90 Base) MCG/ACT inhaler Inhale 1-2 puffs into the lungs every 6 (six) hours as needed for wheezing or shortness of breath. 11/24/22   Laurey Morale, MD  allopurinol (ZYLOPRIM) 100 MG tablet Take 100 mg by mouth daily. Patient not taking: Reported on 06/06/2023    [provider]  aspirin EC 81 MG tablet Take 81 mg by mouth daily. Swallow whole.    [provider]  blood glucose meter kit and supplies KIT Dispense based on patient and insurance preference. Use up to four times daily as directed. 12/25/20   Azucena Fallen, MD  budesonide (PULMICORT) 0.25 MG/2ML nebulizer solution Inhale 2 mLs (0.25 mg total) by nebulization 2 (two) times daily. Patient taking differently: Take 0.25 mg by nebulization daily as needed (for shortness of breath or wheezing). 11/24/22   Laurey Morale, MD  busPIRone (BUSPAR) 5 MG tablet Take 1 tablet (5 mg total) by mouth 3 (three) times daily. Patient taking differently: Take 10 mg by mouth 3 (three)  times daily. 02/07/23   Avanell Shackleton, NP-C  butalbital-acetaminophen-caffeine (FIORICET) [phone] MG tablet Take 1-2 tablets by mouth every 6 (six) hours as needed for headache. 05/08/23 05/07/24  [name], PA-C  calcitRIOL (ROCALTROL) 0.5 MCG capsule Take 1 capsule (0.5 mcg total) by mouth daily. Patient not taking: Reported on 06/07/2023 09/26/16   Elease Etienne, MD  carvedilol (COREG) 6.25 MG tablet Take 1 tablet (6.25 mg total) by mouth 2 (two) times daily. 05/23/23   Milford, Anderson Malta, FNP  doxycycline (VIBRAMYCIN) 100 MG capsule Take 1 capsule (100 mg total) by mouth 2 (two) times daily. 06/11/23   Derwood Kaplan, MD  EPINEPHrine 0.3 mg/0.3 mL IJ SOAJ injection Inject 0.3 mg into the muscle once as needed for anaphylaxis.    [provider]  FARXIGA 10 MG TABS tablet Take 1 tablet (10 mg total) by mouth daily before breakfast. 02/02/23   Laurey Morale, MD  fenofibrate (TRICOR) 145 MG tablet Take 1 tablet (145 mg total) by mouth daily. 02/02/23   Laurey Morale, MD  FEROSUL 325 (65 Fe) MG tablet Take 1 tablet (325 mg total) by mouth daily. 02/09/23   Henson, Vickie L, NP-C  levocetirizine (XYZAL) 5 MG tablet Take 1 tablet (5 mg total) by mouth at bedtime. 11/24/22   Laurey Morale, MD  levothyroxine (SYNTHROID) 125 MCG tablet Take 125 mcg by mouth at bedtime. 05/07/23   [provider]  losartan (COZAAR) 25 MG tablet Take 1 tablet (25 mg total) by mouth daily. 02/02/23   Laurey Morale, MD  nitroGLYCERIN (NITROSTAT) 0.4 MG SL tablet Place 1 tablet (0.4 mg total) under the tongue every 5 (five) minutes as needed for chest pain. 05/23/23   Milford, Anderson Malta, FNP  nystatin cream (MYCOSTATIN) Apply 1 Application topically 2 (two) times daily as needed for dry skin. 09/26/22   [provider]  omeprazole (PRILOSEC) 20 MG capsule Take 1 capsule (20 mg total) by mouth 2 (two) times daily as needed (acid reflux). Patient taking differently: Take 40 mg by mouth  daily. 11/24/22   Laurey Morale, MD  ondansetron (ZOFRAN) 4 MG tablet Take 4 mg by mouth every 8 (eight) hours as needed for nausea or vomiting. Patient not taking: Reported on 02/02/2023 01/16/22   [provider]  OXYGEN Inhale 2 L into the lungs continuous.    [provider]  OZEMPIC, 0.25 OR 0.5 MG/DOSE, 2 MG/3ML SOPN Inject 0.5 mg into the skin every Friday. 05/17/23   [provider]  polyethylene glycol (MIRALAX / GLYCOLAX) 17 g packet Take 17 g by mouth daily as needed for mild constipation. Patient not taking: Reported on 02/02/2023 11/24/22   Laurey Morale, MD  potassium chloride (KLOR-CON) 10 MEQ tablet TAKE 1 TABLET (10 MEQ TOTAL) BY MOUTH 2 (TWO) TIMES DAILY. 05/10/23   Laurey Morale, MD  predniSONE (DELTASONE) 10 MG tablet Take 4 tablets (40 mg total) by mouth daily. 06/11/23   Derwood Kaplan, MD  rosuvastatin (CRESTOR) 10 MG tablet Take 1 tablet (10 mg total) by mouth every evening. 02/02/23   Laurey Morale, MD  senna-docusate (SENOKOT-S) 8.6-50 MG tablet Take 1 tablet by mouth daily. Patient not taking: Reported on 06/07/2023 02/07/23   Avanell Shackleton, NP-C  spironolactone (ALDACTONE) 25 MG tablet Take 1 tablet (25 mg total) by mouth daily. 02/02/23   Laurey Morale, MD  torsemide (DEMADEX) 20 MG tablet Take 2 tablets (40 mg total) by mouth 2 (two) times daily. 11/24/22   Laurey Morale, MD  traZODone (DESYREL) 50 MG tablet Take 2 tablets (100 mg total) by mouth at bedtime. Patient not taking: Reported on 06/07/2023 02/07/23   Avanell Shackleton, NP-C  umeclidinium-vilanterol (ANORO ELLIPTA) 62.5-25 MCG/ACT AEPB Inhale 1 puff into the lungs daily. 02/09/23   Henson, Vickie L, NP-C  Vitamin D, Ergocalciferol, (DRISDOL) 1.25 MG (50000 UNIT) CAPS capsule Take 50,000 Units by mouth once a week. 06/05/23   [provider]  zolpidem (AMBIEN) 10 MG tablet Take 10 mg by mouth at bedtime.    [provider]      Allergies    Bee venom,  Fioricet [butalbital-apap-caffeine], Ibuprofen, Lamisil [terbinafine], and Nsaids    Review of Systems   Review of Systems  Physical Exam Updated Vital Signs BP 112/73 (BP Location: Right Arm)   Pulse 83   Temp 98.9 F (37.2 C) (Oral)   Resp 17   Ht 5\' 5"  (1.651 m)   Wt (!) 167.8 kg   SpO2 97%   BMI 61.57 kg/m  Physical Exam Vitals and nursing note reviewed.  Constitutional:      General: She is not in acute distress.    Appearance: She is well-developed. She is not diaphoretic.  HENT:     Head: Normocephalic and atraumatic.     Right Ear: External ear normal.     Left Ear: External ear normal.  Nose: Nose normal.     Mouth/Throat:     Mouth: Mucous membranes are moist.  Eyes:     General: No scleral icterus.    Conjunctiva/sclera: Conjunctivae normal.  Cardiovascular:     Rate and Rhythm: Normal rate and regular rhythm.     Heart sounds: Normal heart sounds. No murmur heard.    No friction rub. No gallop.  Pulmonary:     Effort: Pulmonary effort is normal. No respiratory distress.     Breath sounds: Normal breath sounds.  Abdominal:     General: Bowel sounds are normal. There is no distension.     Palpations: Abdomen is soft. There is no mass.     Tenderness: There is no abdominal tenderness. There is no guarding.  Musculoskeletal:     Cervical back: Normal range of motion.     Comments: Bilateral feet evaluated.  There is no swelling erythema redness or sores noted to either foot..  DP and PT pulse 2+ bilaterally.  Cap refill less than 2 seconds.  Tenderness to palpation of the feet bilaterally.  Full range of motion of each joint  Skin:    General: Skin is warm and dry.  Neurological:     Mental Status: She is alert and oriented to person, place, and time.  Psychiatric:        Behavior: Behavior normal.     ED Results / Procedures / Treatments   Labs (all labs ordered are listed, but only abnormal results are displayed) Labs Reviewed - No data to  display  EKG None  Radiology No results found.  Procedures Procedures    Medications Ordered in ED Medications  HYDROcodone-acetaminophen (NORCO/VICODIN) 5-325 MG per tablet 1 tablet (has no administration in time range)    ED Course/ Medical Decision Making/ A&P                                 Medical Decision Making Risk Prescription drug management.   Patient here with foot pain complaining of gout however her physical examination is not consistent with gout.  Will give the patient a single Norco as she is complaining of pain however I think this is more likely due to her chronic peripheral neuropathy.  She is well-perfused does not appear to have any other emergent cause such as acute arterial thrombus, deep space infection.  She may follow-up with her primary care for provider for continued pain control.          Final Clinical Impression(s) / ED Diagnoses Final diagnoses:  None    Rx / DC Orders ED Discharge Orders     None         Arthor Captain, PA-C 06/17/23 0818    Wynetta Fines, MD 06/17/23 1135

## 2023-06-17 NOTE — Discharge Instructions (Signed)
 It appears that you are having foot pain.  This may be due to arthritis or your neuropathy but does not appear to be from gout.  He will need to follow-up with your primary care physician.  You may take over-the-counter Tylenol or Advil as needed for pain control. It help right away for any acute changes such as severe discoloration of the foot, draining open wound, fever or chills.

## 2023-06-18 ENCOUNTER — Emergency Department (HOSPITAL_COMMUNITY)
Admission: EM | Admit: 2023-06-18 | Discharge: 2023-06-19 | Disposition: A | Discharge status: Home / Self Care | Attending: Emergency Medicine | Admitting: Emergency Medicine

## 2023-06-18 ENCOUNTER — Ambulatory Visit (HOSPITAL_COMMUNITY): Payer: Medicaid Other

## 2023-06-18 DIAGNOSIS — Z7982 Long term (current) use of aspirin: Secondary | ICD-10-CM | POA: Diagnosis not present

## 2023-06-18 DIAGNOSIS — M79604 Pain in right leg: Secondary | ICD-10-CM | POA: Insufficient documentation

## 2023-06-18 DIAGNOSIS — J449 Chronic obstructive pulmonary disease, unspecified: Secondary | ICD-10-CM | POA: Insufficient documentation

## 2023-06-18 DIAGNOSIS — Z7984 Long term (current) use of oral hypoglycemic drugs: Secondary | ICD-10-CM | POA: Insufficient documentation

## 2023-06-18 DIAGNOSIS — R112 Nausea with vomiting, unspecified: Secondary | ICD-10-CM | POA: Insufficient documentation

## 2023-06-18 DIAGNOSIS — I110 Hypertensive heart disease with heart failure: Secondary | ICD-10-CM | POA: Insufficient documentation

## 2023-06-18 DIAGNOSIS — R197 Diarrhea, unspecified: Secondary | ICD-10-CM | POA: Insufficient documentation

## 2023-06-18 DIAGNOSIS — Z79899 Other long term (current) drug therapy: Secondary | ICD-10-CM | POA: Diagnosis not present

## 2023-06-18 DIAGNOSIS — E119 Type 2 diabetes mellitus without complications: Secondary | ICD-10-CM | POA: Insufficient documentation

## 2023-06-18 DIAGNOSIS — Z794 Long term (current) use of insulin: Secondary | ICD-10-CM | POA: Insufficient documentation

## 2023-06-18 DIAGNOSIS — G8929 Other chronic pain: Secondary | ICD-10-CM | POA: Diagnosis not present

## 2023-06-18 DIAGNOSIS — I509 Heart failure, unspecified: Secondary | ICD-10-CM | POA: Insufficient documentation

## 2023-06-18 DIAGNOSIS — Z7951 Long term (current) use of inhaled steroids: Secondary | ICD-10-CM | POA: Diagnosis not present

## 2023-06-18 NOTE — ED Triage Notes (Signed)
 Pt BIB [facility] from home for BIL leg and feet pain. Hx of gout, requesting pain meds.

## 2023-06-19 ENCOUNTER — Encounter (HOSPITAL_COMMUNITY): Payer: Self-pay | Admitting: Emergency Medicine

## 2023-06-19 ENCOUNTER — Other Ambulatory Visit: Payer: Self-pay

## 2023-06-19 LAB — CBC WITH DIFFERENTIAL/PLATELET
Abs Immature Granulocytes: 0.15 10*3/uL — ABNORMAL HIGH (ref 0.00–0.07)
Basophils Absolute: 0 10*3/uL (ref 0.0–0.1)
Basophils Relative: 0 %
Eosinophils Absolute: 0.1 10*3/uL (ref 0.0–0.5)
Eosinophils Relative: 1 %
HCT: 41.4 % (ref 36.0–46.0)
Hemoglobin: 12.5 g/dL (ref 12.0–15.0)
Immature Granulocytes: 1 %
Lymphocytes Relative: 5 %
Lymphs Abs: 0.8 10*3/uL (ref 0.7–4.0)
MCH: 30.2 pg (ref 26.0–34.0)
MCHC: 30.2 g/dL (ref 30.0–36.0)
MCV: 100 fL (ref 80.0–100.0)
Monocytes Absolute: 0.5 10*3/uL (ref 0.1–1.0)
Monocytes Relative: 3 %
Neutro Abs: 15.2 10*3/uL — ABNORMAL HIGH (ref 1.7–7.7)
Neutrophils Relative %: 90 %
Platelets: 241 10*3/uL (ref 150–400)
RBC: 4.14 MIL/uL (ref 3.87–5.11)
RDW: 14.3 % (ref 11.5–15.5)
WBC: 16.7 10*3/uL — ABNORMAL HIGH (ref 4.0–10.5)
nRBC: 0 % (ref 0.0–0.2)

## 2023-06-19 LAB — RESP PANEL BY RT-PCR (RSV, FLU A&B, COVID)  RVPGX2
Influenza A by PCR: NEGATIVE
Influenza B by PCR: NEGATIVE
Resp Syncytial Virus by PCR: NEGATIVE
SARS Coronavirus 2 by RT PCR: NEGATIVE

## 2023-06-19 LAB — BASIC METABOLIC PANEL
Anion gap: 12 (ref 5–15)
BUN: 15 mg/dL (ref 8–23)
CO2: 31 mmol/L (ref 22–32)
Calcium: 8.7 mg/dL — ABNORMAL LOW (ref 8.9–10.3)
Chloride: 98 mmol/L (ref 98–111)
Creatinine, Ser: 0.94 mg/dL (ref 0.44–1.00)
GFR, Estimated: >60 mL/min (ref >60)
Glucose, Bld: 148 mg/dL — ABNORMAL HIGH (ref 70–99)
Potassium: 3.9 mmol/L (ref 3.5–5.1)
Sodium: 141 mmol/L (ref 135–145)

## 2023-06-19 LAB — HEPATIC FUNCTION PANEL
ALT: 21 U/L (ref 0–44)
AST: 21 U/L (ref 15–41)
Albumin: 3.7 g/dL (ref 3.5–5.0)
Alkaline Phosphatase: 62 U/L (ref 38–126)
Bilirubin, Direct: 0.1 mg/dL (ref 0.0–0.2)
Indirect Bilirubin: 0.7 mg/dL (ref 0.3–0.9)
Total Bilirubin: 0.8 mg/dL (ref 0.0–1.2)
Total Protein: 6.8 g/dL (ref 6.5–8.1)

## 2023-06-19 LAB — CBG MONITORING, ED: Glucose-Capillary: 156 mg/dL — ABNORMAL HIGH (ref 70–99)

## 2023-06-19 LAB — LIPASE, BLOOD: Lipase: 40 U/L (ref 11–51)

## 2023-06-19 MED ORDER — FAMOTIDINE IN NACL 20-0.9 MG/50ML-% IV SOLN
20.0000 mg | Freq: Once | INTRAVENOUS | Status: AC
  Administered 2023-06-19: 20 mg via INTRAVENOUS
  Filled 2023-06-19: qty 50

## 2023-06-19 MED ORDER — ONDANSETRON HCL 4 MG/2ML IJ SOLN
4.0000 mg | Freq: Once | INTRAMUSCULAR | Status: AC
  Administered 2023-06-19: 4 mg via INTRAVENOUS
  Filled 2023-06-19: qty 2

## 2023-06-19 MED ORDER — ONDANSETRON HCL 4 MG PO TABS: 4.0000 mg | ORAL_TABLET | Freq: Three times a day (TID) | ORAL | 0 refills | Status: DC | PRN

## 2023-06-19 NOTE — ED Provider Notes (Addendum)
 MSE was initiated and I personally evaluated the patient and placed orders (if any) at  5:56 AM on June 19, 2023.  Attempted to see patient x3 after room placement, however was in bathroom for approx 20 mins.  63 y.o. F here with multiple complaints.  Initially said leg pain, however now stating she feels weak, fatigued, feels like she has picked something up.  Admits to vomiting in the lobby.    Will check labs, RVP.  Request CBG check which was ordered.  The patient appears stable so that the remainder of the MSE may be completed by another provider.      Garlon Hatchet, PA-C 06/19/23 0600    Garlon Hatchet, PA-C 06/19/23 0601    Durwin Glaze, MD 06/19/23 [phone]

## 2023-06-19 NOTE — ED Provider Notes (Signed)
 [place] EMERGENCY DEPARTMENT AT [facility] Provider Note   CSN: [id number] Arrival date & time: 06/18/23  1512     History  Chief Complaint  Patient presents with   Leg Pain    Michele Owens is a 63 y.o. female.  63 y/o female with hx of DM, dCHF, HTN, COPD and chronic respiratory failure (on chronic 2L via [place]), prior LE DVT (not on chronic anticoagulation) presents to the ED for multiple complaints. Reports initially coming the the ED for pain in her RLE and foot, states she has "gout all over". Has been taking Allopurinol for this w/o relief. Is supposed to see Orthopedics today, but does not think she will make her scheduled appt since she is in the ED. Now also reporting onset of vomiting and diarrhea since arrival in the waiting room. Emesis ongoing and watery, continues to experience dry heaves. No melena, hematochezia, fever. No medications taken for V/D.   Challenging historian. Hx of frequent ED utilization over the past 1.5 months. Was prescribed doxycycline course on 06/10/23 for chest pain, COPD.   Leg Pain      Home Medications Prior to Admission medications   Medication Sig Start Date End Date Taking? Authorizing Provider  ACETAMINOPHEN PO Take 1,300-2,050 mg by mouth daily as needed for moderate pain (pain score 4-6) or headache.    [provider]  albuterol (VENTOLIN HFA) 108 (90 Base) MCG/ACT inhaler Inhale 1-2 puffs into the lungs every 6 (six) hours as needed for wheezing or shortness of breath. 11/24/22   Laurey Morale, MD  allopurinol (ZYLOPRIM) 100 MG tablet Take 100 mg by mouth daily. Patient not taking: Reported on 06/06/2023    [provider]  aspirin EC 81 MG tablet Take 81 mg by mouth daily. Swallow whole.    [provider]  blood glucose meter kit and supplies KIT Dispense based on patient and insurance preference. Use up to four times daily as directed. 12/25/20   Azucena Fallen, MD  budesonide  (PULMICORT) 0.25 MG/2ML nebulizer solution Inhale 2 mLs (0.25 mg total) by nebulization 2 (two) times daily. Patient taking differently: Take 0.25 mg by nebulization daily as needed (for shortness of breath or wheezing). 11/24/22   Laurey Morale, MD  busPIRone (BUSPAR) 5 MG tablet Take 1 tablet (5 mg total) by mouth 3 (three) times daily. Patient taking differently: Take 10 mg by mouth 3 (three) times daily. 02/07/23   Avanell Shackleton, NP-C  butalbital-acetaminophen-caffeine (FIORICET) [phone] MG tablet Take 1-2 tablets by mouth every 6 (six) hours as needed for headache. 05/08/23 05/07/24  [name], PA-C  calcitRIOL (ROCALTROL) 0.5 MCG capsule Take 1 capsule (0.5 mcg total) by mouth daily. Patient not taking: Reported on 06/07/2023 09/26/16   Elease Etienne, MD  carvedilol (COREG) 6.25 MG tablet Take 1 tablet (6.25 mg total) by mouth 2 (two) times daily. 05/23/23   Milford, Anderson Malta, FNP  doxycycline (VIBRAMYCIN) 100 MG capsule Take 1 capsule (100 mg total) by mouth 2 (two) times daily. 06/11/23   Derwood Kaplan, MD  EPINEPHrine 0.3 mg/0.3 mL IJ SOAJ injection Inject 0.3 mg into the muscle once as needed for anaphylaxis.    [provider]  FARXIGA 10 MG TABS tablet Take 1 tablet (10 mg total) by mouth daily before breakfast. 02/02/23   Laurey Morale, MD  fenofibrate (TRICOR) 145 MG tablet Take 1 tablet (145 mg total) by mouth daily. 02/02/23   Laurey Morale,  MD  FEROSUL 325 (65 Fe) MG tablet Take 1 tablet (325 mg total) by mouth daily. 02/09/23   Henson, Vickie L, NP-C  levocetirizine (XYZAL) 5 MG tablet Take 1 tablet (5 mg total) by mouth at bedtime. 11/24/22   Laurey Morale, MD  levothyroxine (SYNTHROID) 125 MCG tablet Take 125 mcg by mouth at bedtime. 05/07/23   [provider]  losartan (COZAAR) 25 MG tablet Take 1 tablet (25 mg total) by mouth daily. 02/02/23   Laurey Morale, MD  nitroGLYCERIN (NITROSTAT) 0.4 MG SL tablet Place 1 tablet (0.4 mg total)  under the tongue every 5 (five) minutes as needed for chest pain. 05/23/23   [place], Anderson Malta, FNP  nystatin cream (MYCOSTATIN) Apply 1 Application topically 2 (two) times daily as needed for dry skin. 09/26/22   [provider]  omeprazole (PRILOSEC) 20 MG capsule Take 1 capsule (20 mg total) by mouth 2 (two) times daily as needed (acid reflux). Patient taking differently: Take 40 mg by mouth daily. 11/24/22   Laurey Morale, MD  ondansetron (ZOFRAN) 4 MG tablet Take 1 tablet (4 mg total) by mouth every 8 (eight) hours as needed for nausea or vomiting. 06/19/23   Antony Madura, PA-C  OXYGEN Inhale 2 L into the lungs continuous.    [provider]  OZEMPIC, 0.25 OR 0.5 MG/DOSE, 2 MG/3ML SOPN Inject 0.5 mg into the skin every Friday. 05/17/23   [provider]  polyethylene glycol (MIRALAX / GLYCOLAX) 17 g packet Take 17 g by mouth daily as needed for mild constipation. Patient not taking: Reported on 02/02/2023 11/24/22   Laurey Morale, MD  potassium chloride (KLOR-CON) 10 MEQ tablet TAKE 1 TABLET (10 MEQ TOTAL) BY MOUTH 2 (TWO) TIMES DAILY. 05/10/23   Laurey Morale, MD  predniSONE (DELTASONE) 10 MG tablet Take 4 tablets (40 mg total) by mouth daily. 06/11/23   Derwood Kaplan, MD  rosuvastatin (CRESTOR) 10 MG tablet Take 1 tablet (10 mg total) by mouth every evening. 02/02/23   Laurey Morale, MD  senna-docusate (SENOKOT-S) 8.6-50 MG tablet Take 1 tablet by mouth daily. Patient not taking: Reported on 06/07/2023 02/07/23   Avanell Shackleton, NP-C  spironolactone (ALDACTONE) 25 MG tablet Take 1 tablet (25 mg total) by mouth daily. 02/02/23   Laurey Morale, MD  torsemide (DEMADEX) 20 MG tablet Take 2 tablets (40 mg total) by mouth 2 (two) times daily. 11/24/22   Laurey Morale, MD  traZODone (DESYREL) 50 MG tablet Take 2 tablets (100 mg total) by mouth at bedtime. Patient not taking: Reported on 06/07/2023 02/07/23   Avanell Shackleton, NP-C  umeclidinium-vilanterol (ANORO  ELLIPTA) 62.5-25 MCG/ACT AEPB Inhale 1 puff into the lungs daily. 02/09/23   Henson, Vickie L, NP-C  Vitamin D, Ergocalciferol, (DRISDOL) 1.25 MG (50000 UNIT) CAPS capsule Take 50,000 Units by mouth once a week. 06/05/23   [provider]  zolpidem (AMBIEN) 10 MG tablet Take 10 mg by mouth at bedtime.    [provider]      Allergies    Bee venom, Fioricet [butalbital-apap-caffeine], Ibuprofen, Lamisil [terbinafine], and Nsaids    Review of Systems   Review of Systems Ten systems reviewed and are negative for acute change, except as noted in the HPI.    Physical Exam Updated Vital Signs BP 123/66   Pulse 91   Temp 98.6 F (37 C)   Resp 17   SpO2 97%   Physical Exam Vitals and nursing  note reviewed.  Constitutional:      General: She is not in acute distress.    Appearance: She is well-developed. She is not diaphoretic.     Comments: Chronically ill appearing, nontoxic. Morbidly obese.  HENT:     Head: Normocephalic and atraumatic.  Eyes:     General: No scleral icterus.    Conjunctiva/sclera: Conjunctivae normal.  Cardiovascular:     Rate and Rhythm: Normal rate and regular rhythm.     Pulses: Normal pulses.  Pulmonary:     Effort: Pulmonary effort is normal. No respiratory distress.     Breath sounds: No stridor. No wheezing.     Comments: No hypoxemia on chronic 2L via [place]. No respiratory distress or use of accessory muscles. No wheezing. Abdominal:     Palpations: Abdomen is soft.     Tenderness: There is no abdominal tenderness. There is no guarding.     Comments: Soft abdomen w/o focal TTP. No guarding or peritoneal signs. Exam limited 2/2 habitus.   Musculoskeletal:        General: Normal range of motion.     Cervical back: Normal range of motion.     Comments: No asymmetric edema BLE  Skin:    General: Skin is warm and dry.     Coloration: Skin is not pale.     Findings: No erythema or rash.  Neurological:     Mental Status: She is alert and  oriented to person, place, and time.     Coordination: Coordination normal.  Psychiatric:        Behavior: Behavior normal.     ED Results / Procedures / Treatments   Labs (all labs ordered are listed, but only abnormal results are displayed) Labs Reviewed  CBC WITH DIFFERENTIAL/PLATELET - Abnormal; Notable for the following components:      Result Value   WBC 16.7 (*)    Neutro Abs 15.2 (*)    Abs Immature Granulocytes 0.15 (*)    All other components within normal limits  BASIC METABOLIC PANEL - Abnormal; Notable for the following components:   Glucose, Bld 148 (*)    Calcium 8.7 (*)    All other components within normal limits  CBG MONITORING, ED - Abnormal; Notable for the following components:   Glucose-Capillary 156 (*)    All other components within normal limits  RESP PANEL BY RT-PCR (RSV, FLU A&B, COVID)  [id number]  HEPATIC FUNCTION PANEL  LIPASE, BLOOD  CBG MONITORING, ED    EKG None  Radiology No results found.  Procedures Procedures    Medications Ordered in ED Medications  ondansetron (ZOFRAN) injection 4 mg (4 mg Intravenous Given 06/19/23 0749)  famotidine (PEPCID) IVPB 20 mg premix (0 mg Intravenous Stopped 06/19/23 0945)    ED Course/ Medical Decision Making/ A&P Clinical Course as of 06/19/23 1613  Tue Jun 19, 2023  0950 Tolerating PO fluids [KH]  0950 Leukocytosis is chronic, stable compared to prior.  No electrolyte during months.  Liver and kidney function preserved. [KH]    Clinical Course User Index [KH] Antony Madura, PA-C                                 Medical Decision Making Amount and/or Complexity of Data Reviewed Labs: ordered.  Risk Prescription drug management.   This patient presents to the ED for concern of BLE pain (chronic) and V/D, this involves an extensive number of  treatment options, and is a complaint that carries with it a high risk of complications and morbidity.    Co morbidities that complicate the patient  evaluation  DM dCHF HTN COPD and CRF   Additional history obtained:  External records from outside source obtained and reviewed including negative CT renal stone study on 06/06/23.   Lab Tests:  I Ordered, and personally interpreted labs.  The pertinent results include:  WBC 16.7 (stable, chronic elevation), Glucose 148. Kidney and liver function preserved.   Cardiac Monitoring:  The patient was maintained on a cardiac monitor.  I personally viewed and interpreted the cardiac monitored which showed an underlying rhythm of: NSR   Medicines ordered and prescription drug management:  I ordered medication including Zofran and Pepcid for nausea/vomiting  Reevaluation of the patient after these medicines showed that the patient improved I have reviewed the patients home medicines and have made adjustments as needed   Test Considered:  CT abdomen/pelvis - felt low yield   Problem List / ED Course:  As above Hx of frequent ED utilization in the past 1.5 months. Chronic BLE pain. Seen for same on 06/17/23. No hx of recent trauma since last visit. No present concern for acute infection or cellulitis. Neurovascularly intact. Will necessitate ongoing outpatient orthopedic evaluation. Also with V/D onset while in the waiting room. Suspected viral illness. Abdominal exam benign. No focal TTP. Labs in keeping with baseline. Chronic leukocytosis is unchanged. Afebrile. Tolerating PO after Pepcid and antiemetics. Reports she is feeling better.   Reevaluation:  After the interventions noted above, I reevaluated the patient and found that they have :improved   Social Determinants of Health:  Chronic O2 use   Dispostion:  After consideration of the diagnostic results and the patients response to treatment, I feel that the patent would benefit from ongoing outpatient f/u and adherence to daily medications. Given Rx for Zofran should nausea persist. Return precautions discussed and  provided. Patient discharged in stable condition via [facility] with no unaddressed concerns.          Final Clinical Impression(s) / ED Diagnoses Final diagnoses:  Nausea and vomiting, unspecified vomiting type  Chronic pain of lower extremity, unspecified laterality    Rx / DC Orders ED Discharge Orders          Ordered    ondansetron (ZOFRAN) 4 MG tablet  Every 8 hours PRN        06/19/23 1025              Antony Madura, PA-C 06/19/23 1620    Gerhard Munch, MD 06/23/23 1414

## 2023-06-19 NOTE — ED Notes (Signed)
 [facility] here for pt pickup. Discharge papers provided.

## 2023-06-19 NOTE — Discharge Instructions (Signed)
 Your evaluation in the ED today was reassuring. Your COVID and flu tests were negative. Use Zofran for management of nausea/vomiting. Drink plenty of clear liquids to prevent dehydration. Continue your daily prescribed medications and follow up with your primary care doctor.

## 2023-06-19 NOTE — ED Notes (Signed)
[facility] called no eta

## 2023-06-25 ENCOUNTER — Other Ambulatory Visit: Payer: Self-pay | Admitting: Family Medicine

## 2023-06-25 IMAGING — DX DG FOOT COMPLETE 3+V*R*
3 series · 3 of 3 positions shown · non-contrast
Comparison: None.

CLINICAL DATA: 59-year-old female with right foot pain.

EXAM:
RIGHT FOOT COMPLETE - 3+ VIEW

[foot ap]
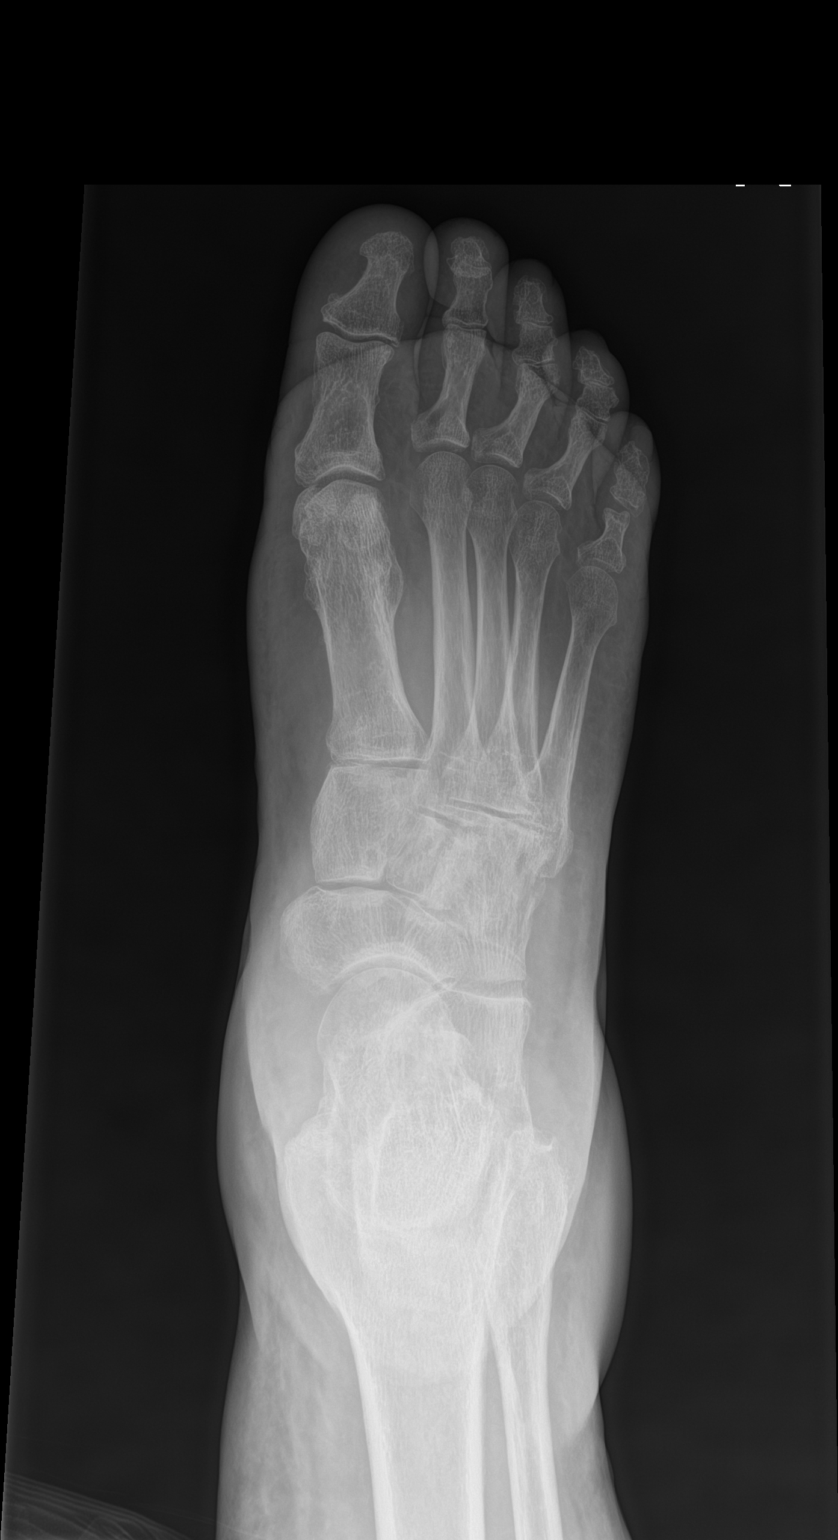

[foot obl]
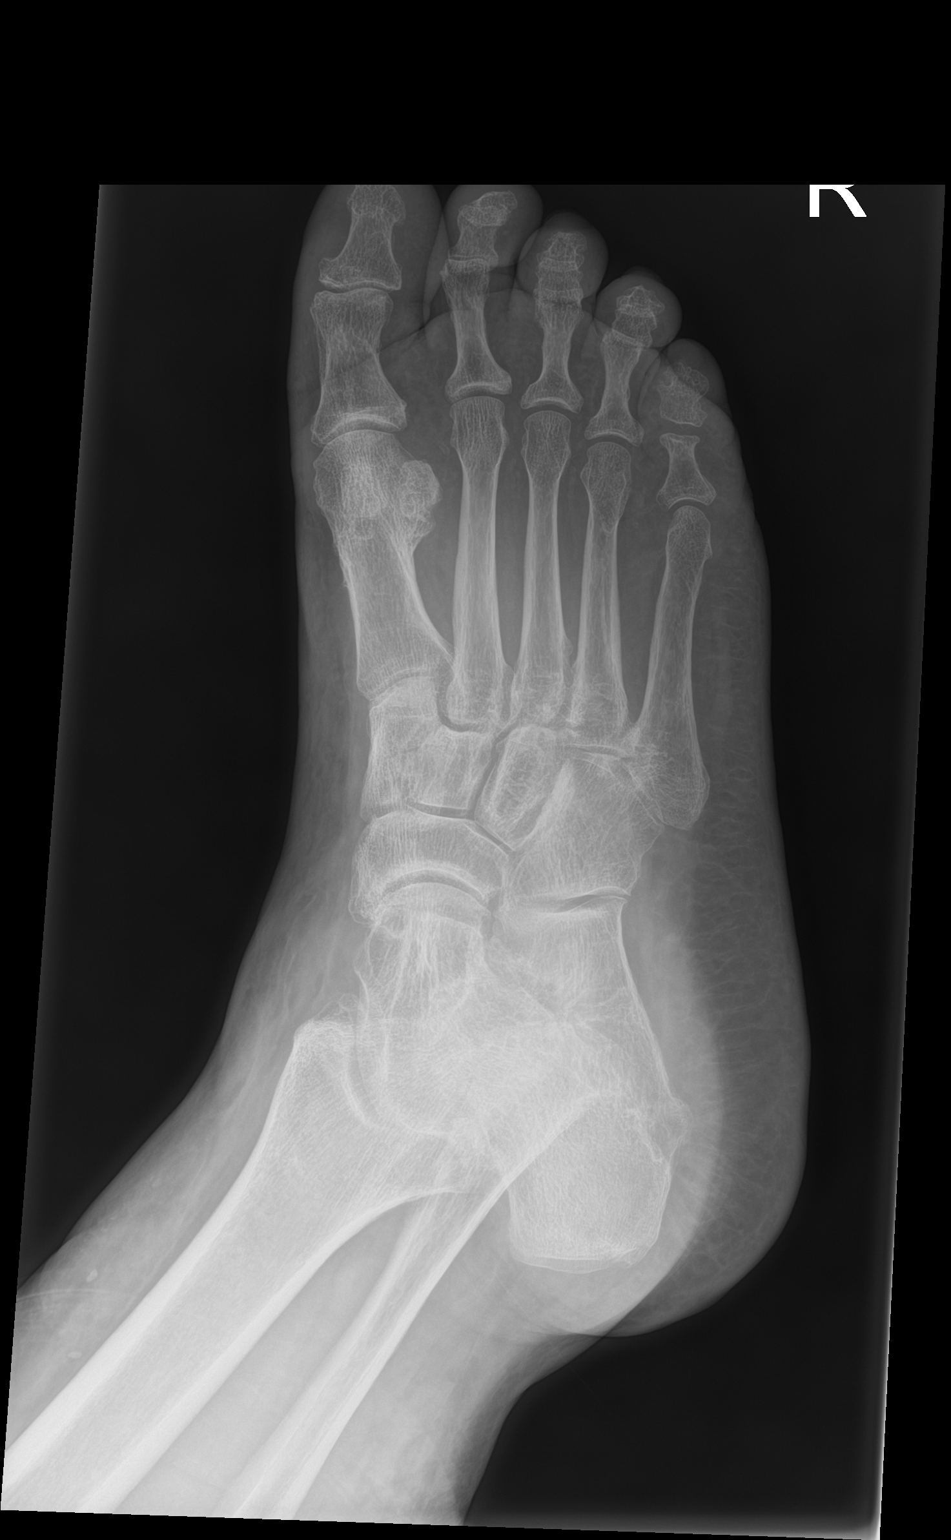

[foot lat]
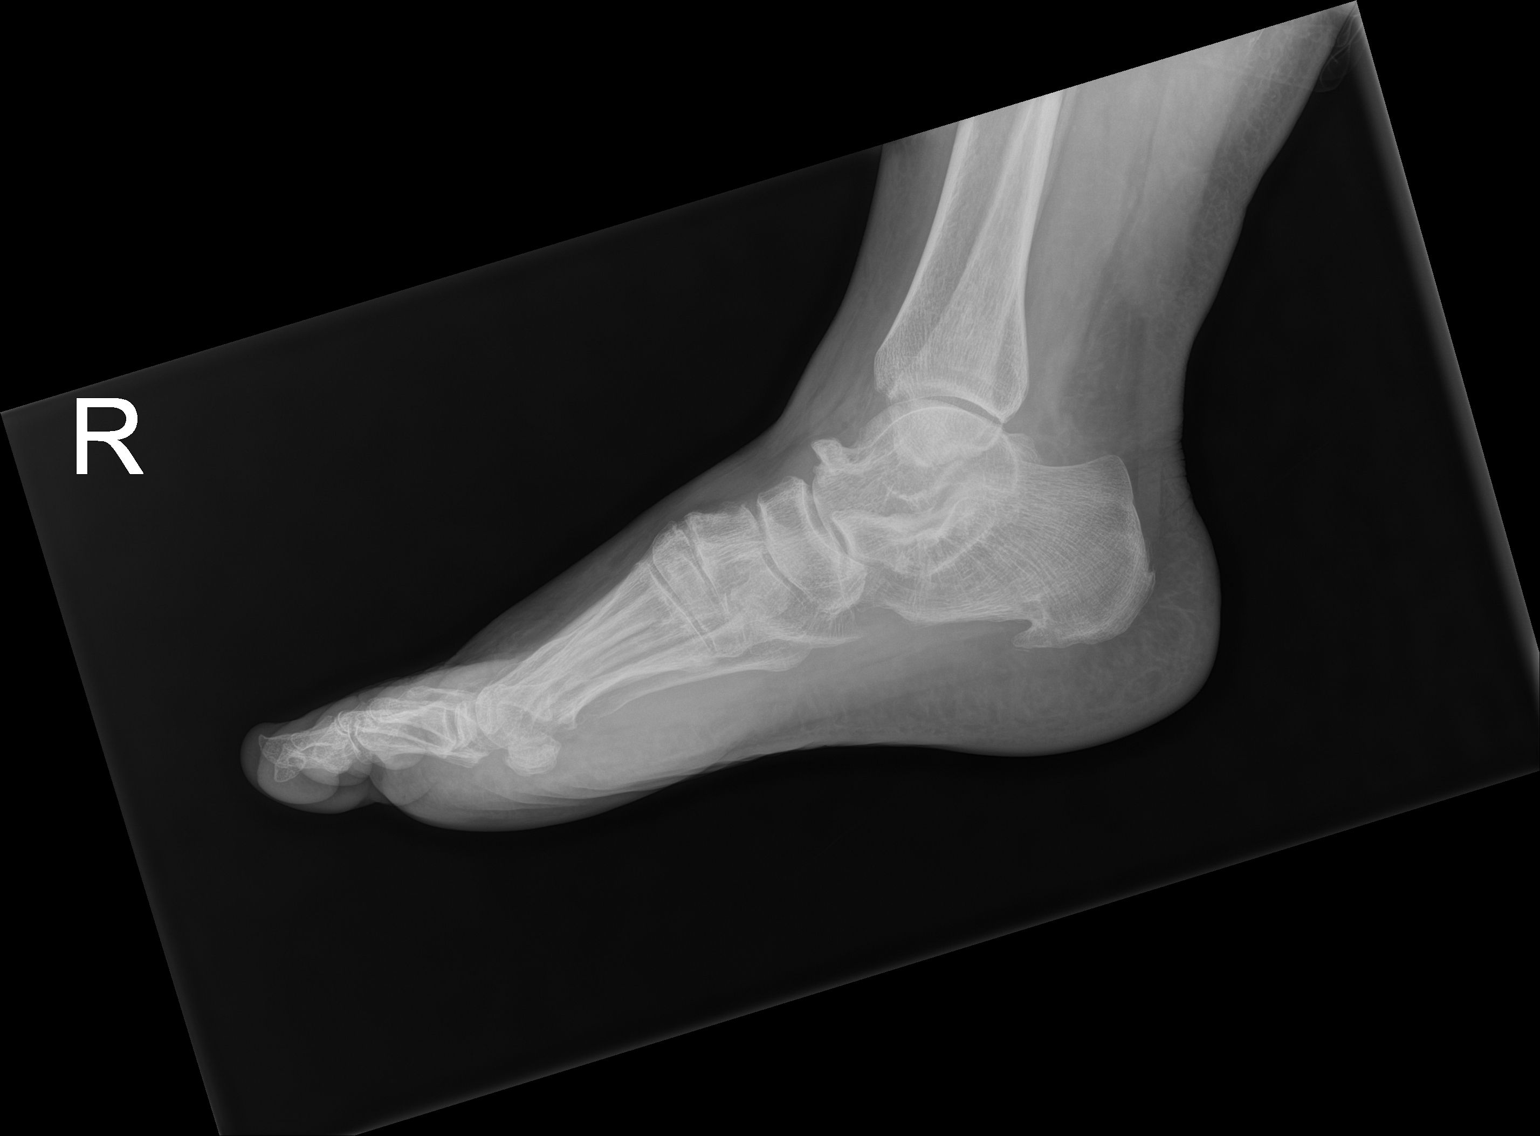

[3 of 3 positions shown; findings below may reference images not displayed]

FINDINGS: There is no acute fracture or dislocation. The bones are osteopenic.
Diffuse subcutaneous edema.
IMPRESSION: No acute fracture or dislocation.

## 2023-06-25 IMAGING — CR DG TOE GREAT 2+V*L*
3 series · 3 of 3 positions shown · non-contrast
Comparison: None.

CLINICAL DATA: Left great toe pain.  No known injury.

EXAM:
LEFT GREAT TOE

[toe ap]
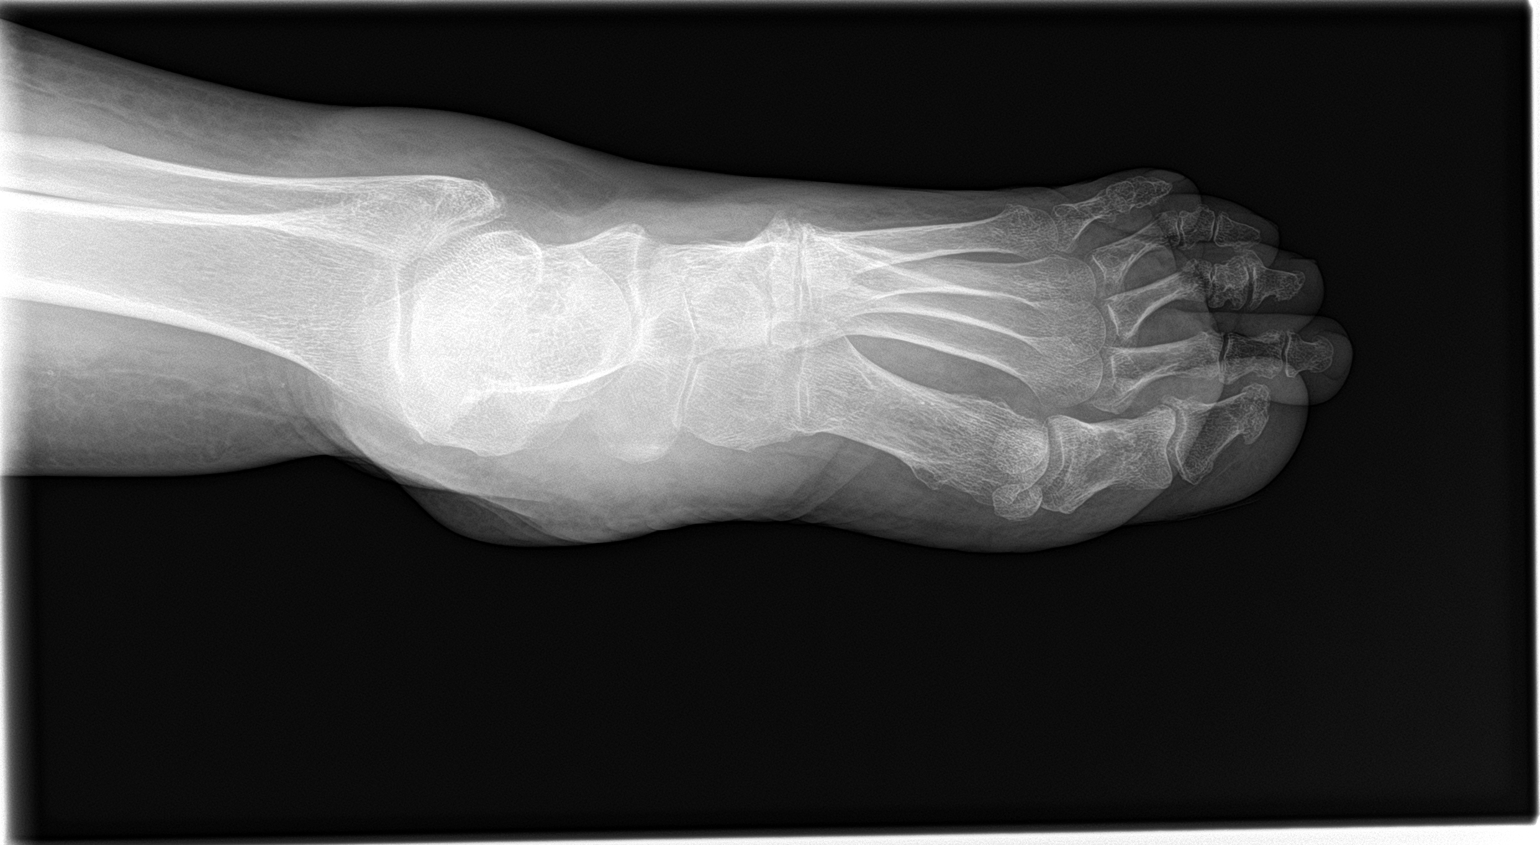

[toe obl]
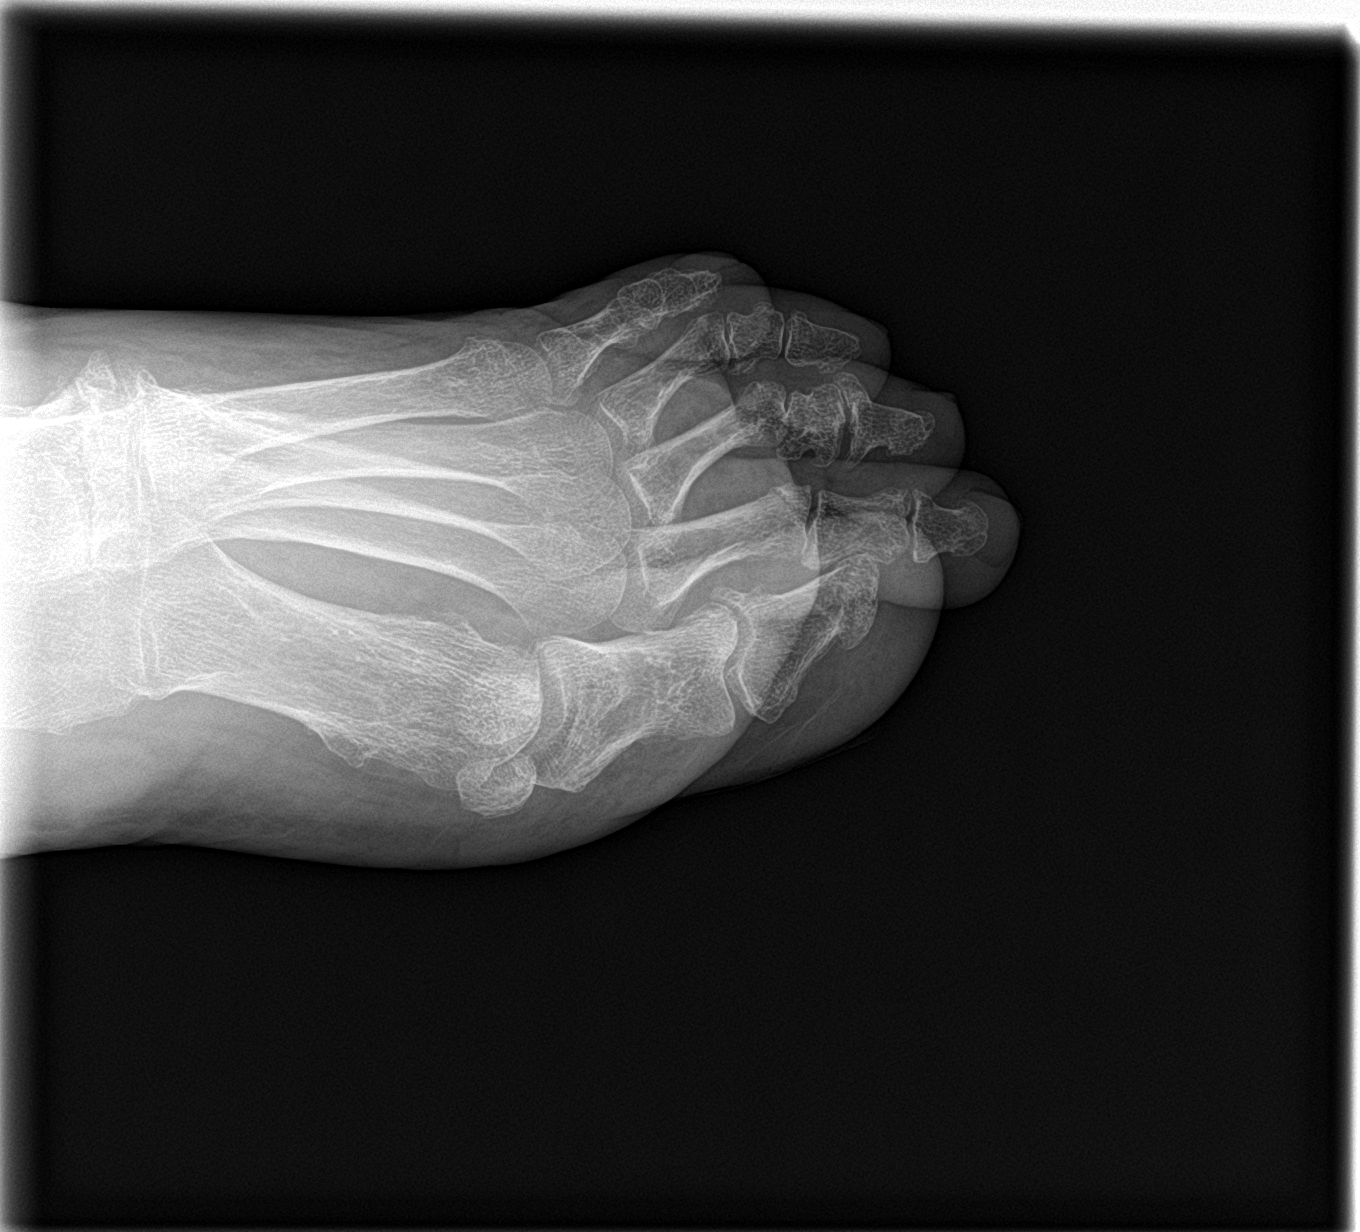

[toe lat]
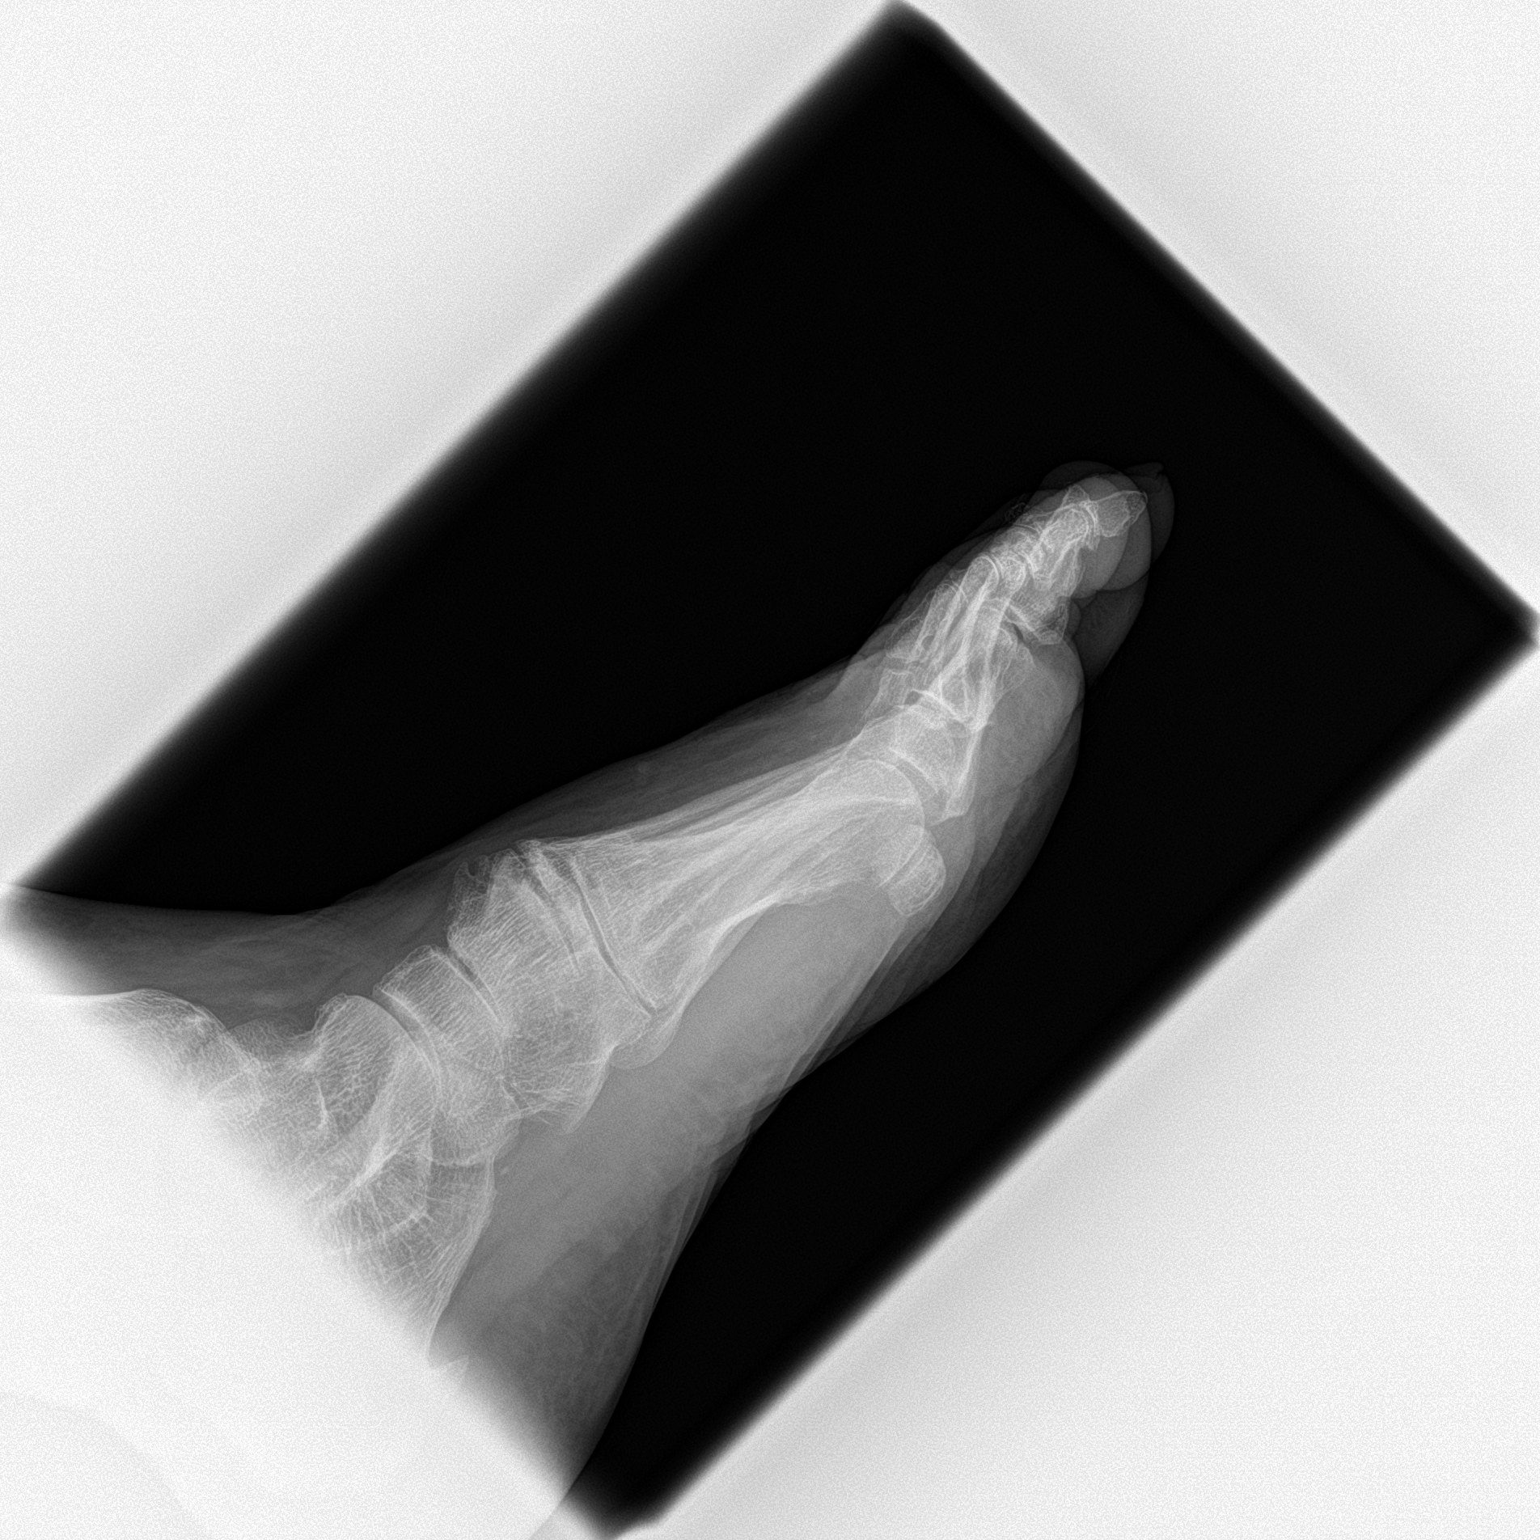

[3 of 3 positions shown; findings below may reference images not displayed]

FINDINGS: There is no acute bony or joint abnormality. Scattered
interphalangeal joint osteoarthritis appears worst at the DIP joint
of the third toe. Midfoot degenerative change is also seen. Soft
tissues are negative.
IMPRESSION: No acute finding.

Multifocal osteoarthritis.

## 2023-06-25 IMAGING — CR DG CHEST 2V
2 series · 2 of 2 positions shown · non-contrast
Comparison: Single-view of the chest 09/11/2020. PA and lateral
chest 08/21/2020.

CLINICAL DATA: Chest pain.

EXAM:
CHEST - 2 VIEW

[chest lat]
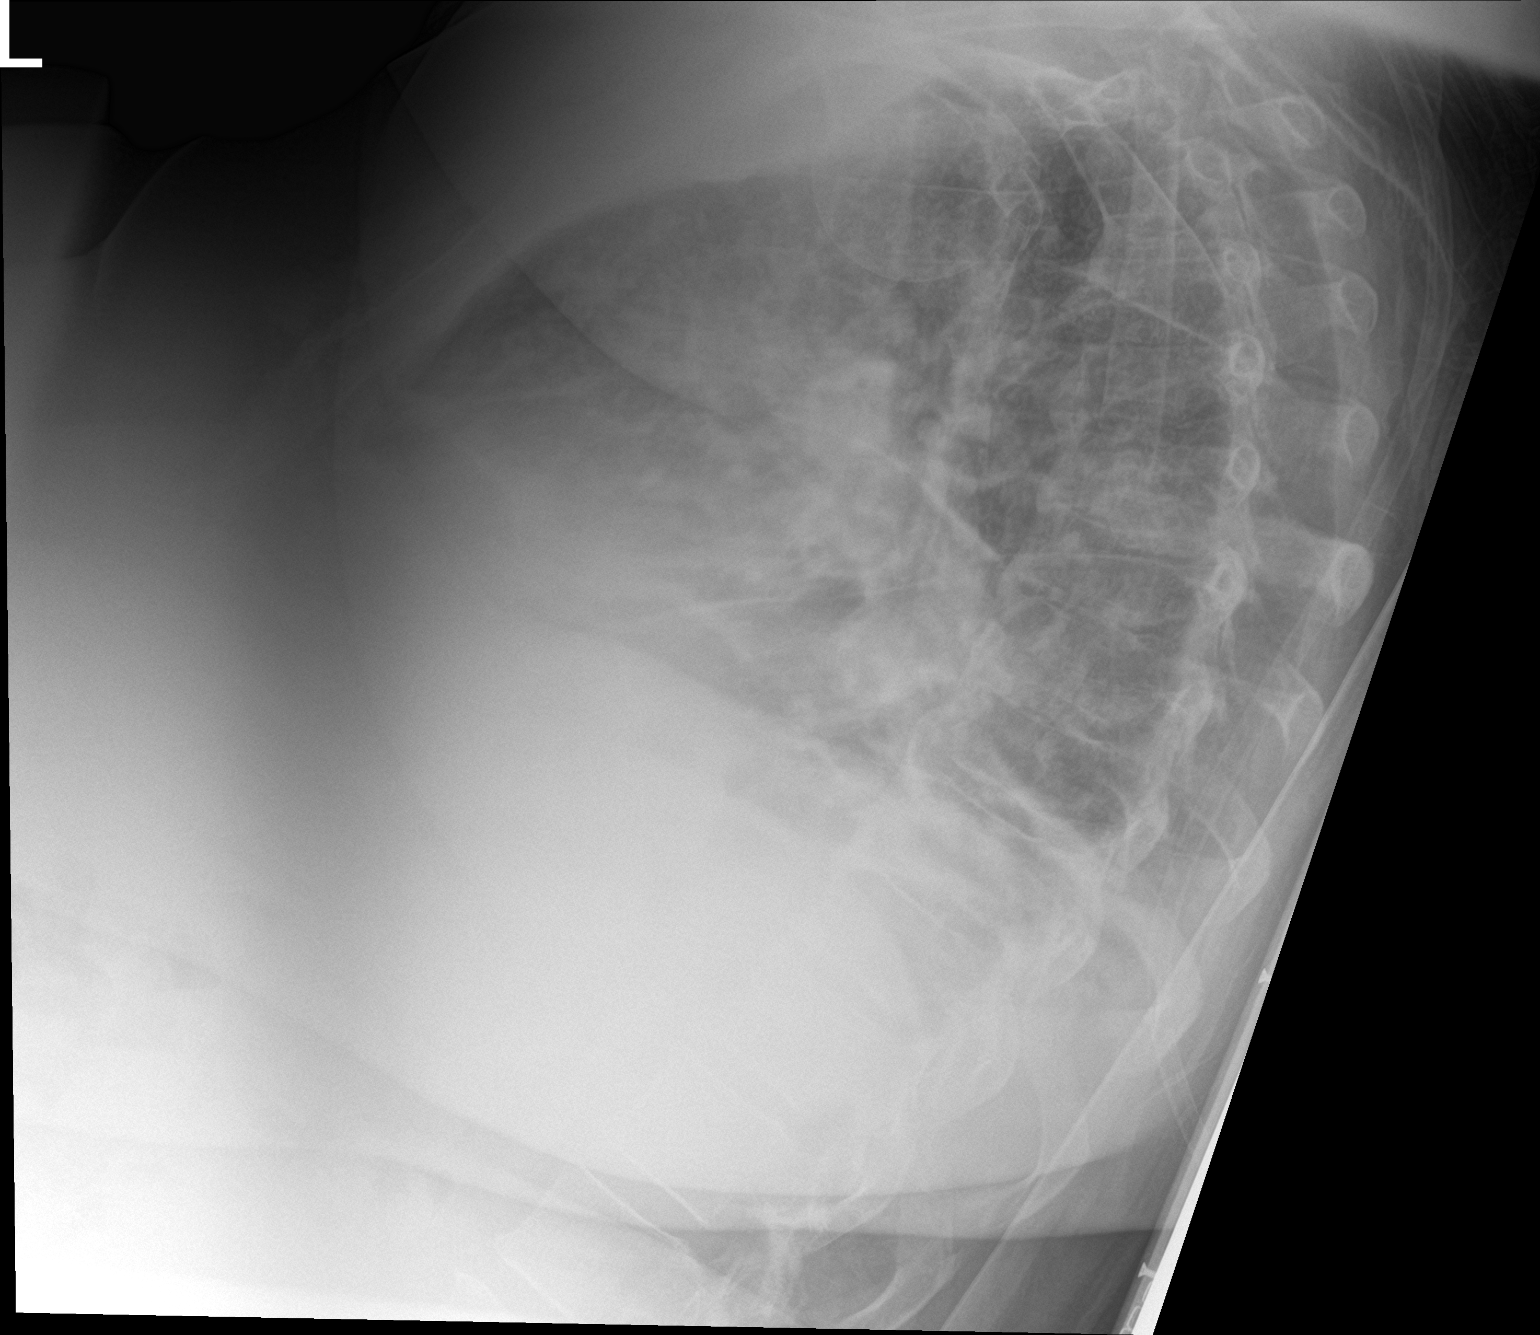

[chest ap]
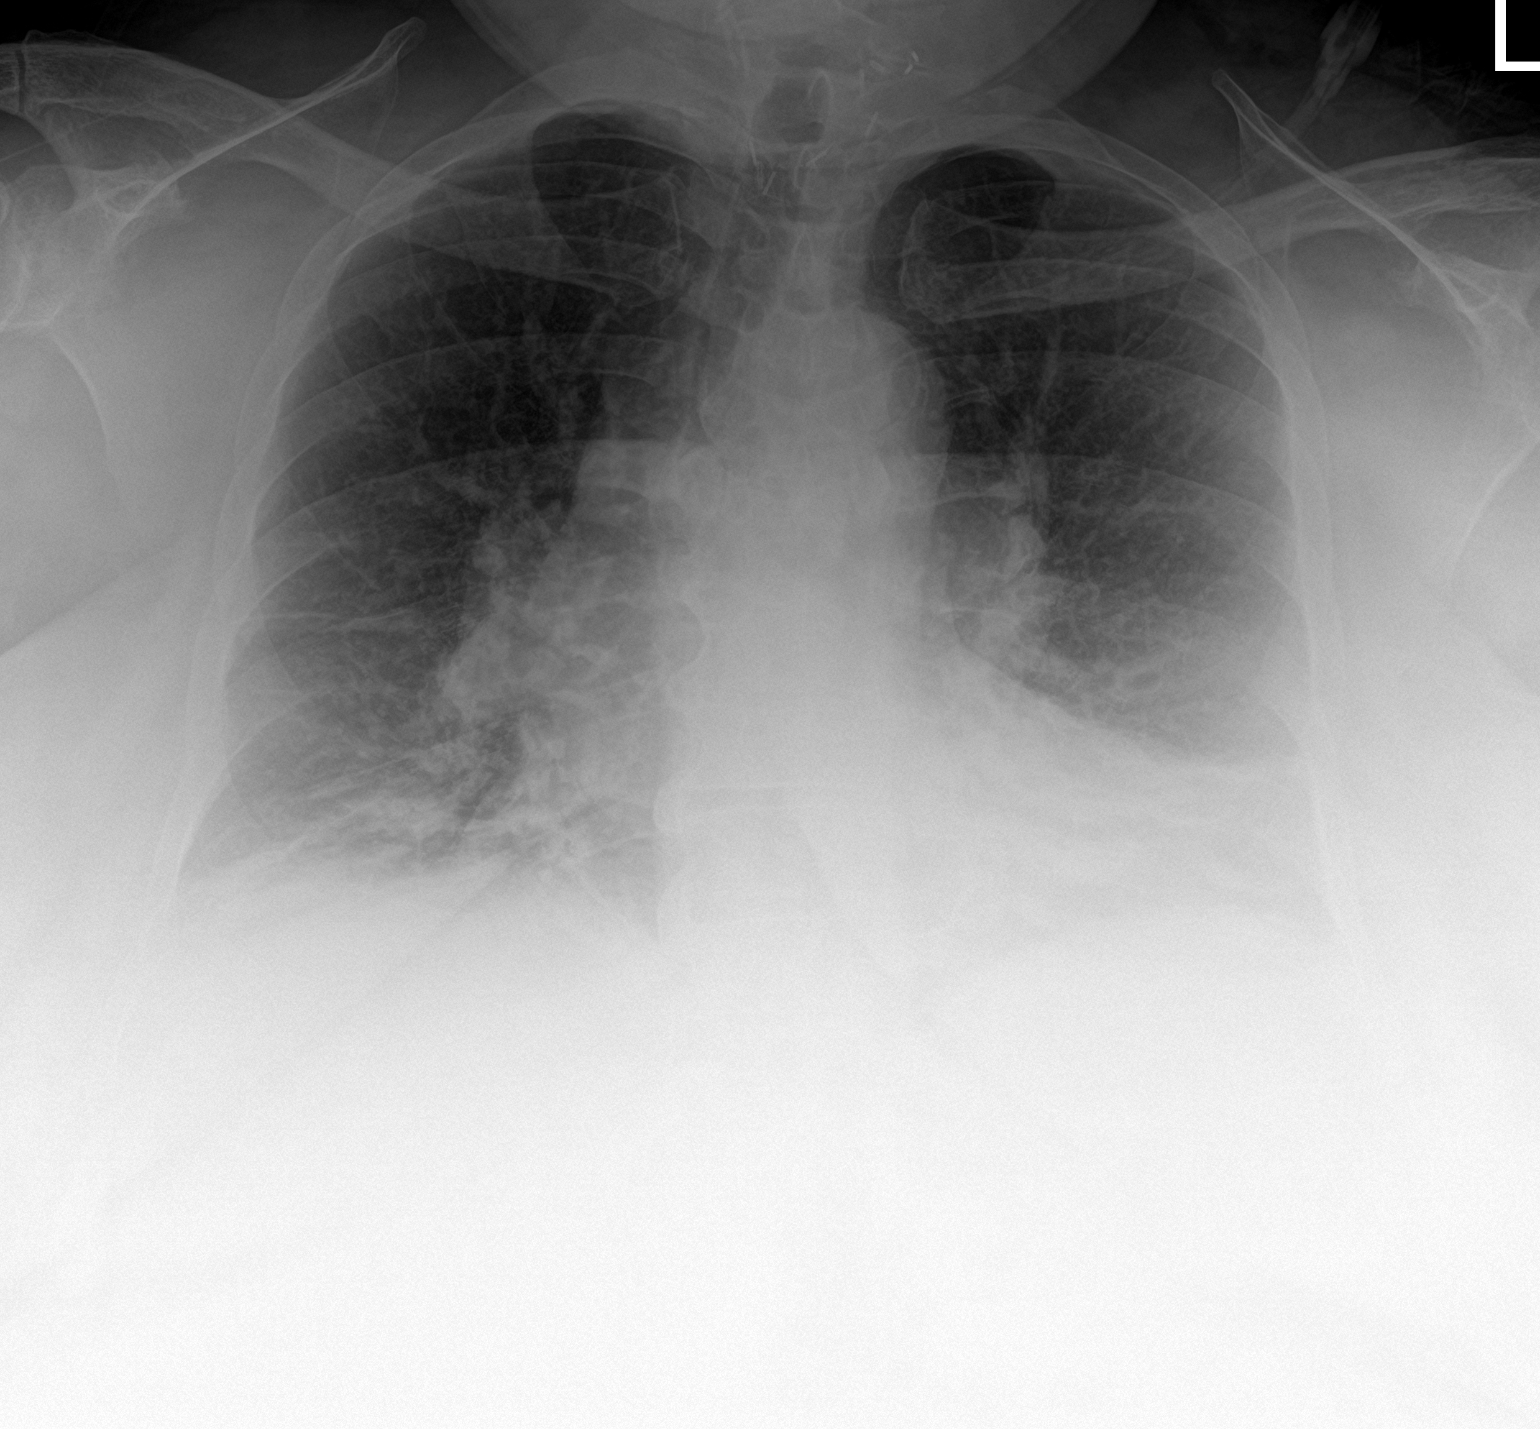

[2 of 2 positions shown; findings below may reference images not displayed]

FINDINGS: Again seen is bibasilar atelectasis. There is cardiomegaly. No
pneumothorax or pleural effusion. Lung volumes are low. No acute or
focal bony abnormality.
IMPRESSION: Bibasilar atelectasis in a low volume chest.

Cardiomegaly.

## 2023-06-28 ENCOUNTER — Encounter (HOSPITAL_COMMUNITY): Payer: Self-pay

## 2023-06-28 ENCOUNTER — Emergency Department (HOSPITAL_COMMUNITY)

## 2023-06-28 ENCOUNTER — Emergency Department (HOSPITAL_COMMUNITY)
Admission: EM | Admit: 2023-06-28 | Discharge: 2023-06-28 | Disposition: A | Discharge status: Home / Self Care | Attending: Emergency Medicine | Admitting: Emergency Medicine

## 2023-06-28 DIAGNOSIS — M1712 Unilateral primary osteoarthritis, left knee: Secondary | ICD-10-CM | POA: Insufficient documentation

## 2023-06-28 DIAGNOSIS — M25562 Pain in left knee: Secondary | ICD-10-CM | POA: Diagnosis present

## 2023-06-28 DIAGNOSIS — Z7982 Long term (current) use of aspirin: Secondary | ICD-10-CM | POA: Insufficient documentation

## 2023-06-28 DIAGNOSIS — M1990 Unspecified osteoarthritis, unspecified site: Secondary | ICD-10-CM

## 2023-06-28 DIAGNOSIS — E119 Type 2 diabetes mellitus without complications: Secondary | ICD-10-CM | POA: Diagnosis not present

## 2023-06-28 MED ORDER — OXYCODONE HCL 5 MG PO TABS
5.0000 mg | ORAL_TABLET | Freq: Once | ORAL | Status: AC
  Administered 2023-06-28: 5 mg via ORAL
  Filled 2023-06-28: qty 1

## 2023-06-28 MED ORDER — BUSPIRONE HCL 10 MG PO TABS
10.0000 mg | ORAL_TABLET | Freq: Once | ORAL | Status: AC
  Administered 2023-06-28: 10 mg via ORAL
  Filled 2023-06-28: qty 1

## 2023-06-28 NOTE — ED Notes (Addendum)
 [facility] is here for transport. Unable to obatin vitals due to patient refusing. Patient states we have done nothing for her today.

## 2023-06-28 NOTE — ED Triage Notes (Signed)
 Patient states that she is having a hard time caring for herself due to pain in the left knee. Knee pain was worse yesterday. Denies recnt falls. Patient states she is having pain all over her body. Patient uses 2L at baseline of oxygen.

## 2023-06-28 NOTE — Discharge Instructions (Signed)
 Please follow-up with your primary care provider in regards to recent ER visit. Today's physical exam and imaging was reassuring and you have severe arthritis that your orthopedist will need to evaluate for. You may rest, ice, compress, elevate your extremity and use Tylenol every 6 hours as needed for pain. If you begin to have decreased sensation, skin color changes, pain out of proportion/not controlled by over-the-counter medications, rigid compartments, or other worsening of symptoms please return to ER.

## 2023-06-28 NOTE — Progress Notes (Signed)
 Orthopedic Tech Progress Note Patient Details:  Michele Owens [birth year] [id number] Applied hinged knee brace per order.  Ortho Devices Type of Ortho Device: Knee Sleeve Ortho Device/Splint Location: LLE Ortho Device/Splint Interventions: Ordered, Application, Adjustment   Post Interventions Patient Tolerated: Fair Instructions Provided: Adjustment of device, Care of device, Poper ambulation with device  Blase Mess 06/28/2023, 11:15 PM

## 2023-06-28 NOTE — ED Provider Notes (Signed)
 [place] EMERGENCY DEPARTMENT AT [facility] Provider Note   CSN: [id number] Arrival date & time: 06/28/23  1922     History  Chief Complaint  Patient presents with   Knee Pain    Michele Owens is a 63 y.o. 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  Patient states that it hurts to walk and that she is able to bear weight and uses a walker however states that she feels more weak on the left side and that her arthritis is flaring up.  Patient states that she is fallen on her knees a few times due to this but has not hit her head or neck.  Patient can still feel her lower extremity and denies any fevers, redness but states that it swollen.  Patient sees orthopedics and has an appointment on April 2 states she is just here to have her arthritis evaluated.    Home Medications Prior to Admission medications   Medication Sig Start Date End Date Taking? Authorizing Provider  ACETAMINOPHEN PO Take 1,300-2,050 mg by mouth daily as needed for moderate pain (pain score 4-6) or headache.    [provider]  albuterol (VENTOLIN HFA) 108 (90 Base) MCG/ACT inhaler Inhale 1-2 puffs into the lungs every 6 (six) hours as needed for wheezing or shortness of breath. 11/24/22   Laurey Morale, MD  allopurinol (ZYLOPRIM) 100 MG tablet Take 100 mg by mouth daily. Patient not taking: Reported on 06/06/2023    [provider]  aspirin EC 81 MG tablet Take 81 mg by mouth daily. Swallow whole.    [provider]  blood glucose meter kit and supplies KIT Dispense based on patient and insurance preference. Use up to four times daily as directed. 12/25/20   Azucena Fallen, MD  budesonide (PULMICORT) 0.25 MG/2ML nebulizer solution Inhale 2 mLs (0.25 mg total) by nebulization 2 (two) times daily. Patient taking differently: Take 0.25 mg by nebulization daily as needed (for shortness of  breath or wheezing). 11/24/22   Laurey Morale, MD  busPIRone (BUSPAR) 5 MG tablet Take 1 tablet (5 mg total) by mouth 3 (three) times daily. Patient taking differently: Take 10 mg by mouth 3 (three) times daily. 02/07/23   Avanell Shackleton, NP-C  butalbital-acetaminophen-caffeine (FIORICET) [phone] MG tablet Take 1-2 tablets by mouth every 6 (six) hours as needed for headache. 05/08/23 05/07/24  [name], PA-C  calcitRIOL (ROCALTROL) 0.5 MCG capsule Take 1 capsule (0.5 mcg total) by mouth daily. Patient not taking: Reported on 06/07/2023 09/26/16   Elease Etienne, MD  carvedilol (COREG) 6.25 MG tablet Take 1 tablet (6.25 mg total) by mouth 2 (two) times daily. 05/23/23   Milford, Anderson Malta, FNP  doxycycline (VIBRAMYCIN) 100 MG capsule Take 1 capsule (100 mg total) by mouth 2 (two) times daily. 06/11/23   Derwood Kaplan, MD  EPINEPHrine 0.3 mg/0.3 mL IJ SOAJ injection Inject 0.3 mg into the muscle once as needed for anaphylaxis.    [provider]  FARXIGA 10 MG TABS tablet Take 1 tablet (10 mg total) by mouth daily before breakfast. 02/02/23   Laurey Morale, MD  fenofibrate (TRICOR) 145 MG tablet Take 1 tablet (145 mg total) by mouth daily. 02/02/23   Laurey Morale, MD  FEROSUL 325 (65 Fe) MG tablet Take 1 tablet (325 mg total) by mouth daily. 02/09/23   Henson, Vickie L, NP-C  levocetirizine (  XYZAL) 5 MG tablet Take 1 tablet (5 mg total) by mouth at bedtime. 11/24/22   Laurey Morale, MD  levothyroxine (SYNTHROID) 125 MCG tablet Take 125 mcg by mouth at bedtime. 05/07/23   [provider]  losartan (COZAAR) 25 MG tablet Take 1 tablet (25 mg total) by mouth daily. 02/02/23   Laurey Morale, MD  nitroGLYCERIN (NITROSTAT) 0.4 MG SL tablet Place 1 tablet (0.4 mg total) under the tongue every 5 (five) minutes as needed for chest pain. 05/23/23   Milford, Anderson Malta, FNP  nystatin cream (MYCOSTATIN) Apply 1 Application topically 2 (two) times daily as needed for dry skin.  09/26/22   [provider]  omeprazole (PRILOSEC) 20 MG capsule Take 1 capsule (20 mg total) by mouth 2 (two) times daily as needed (acid reflux). Patient taking differently: Take 40 mg by mouth daily. 11/24/22   Laurey Morale, MD  ondansetron (ZOFRAN) 4 MG tablet Take 1 tablet (4 mg total) by mouth every 8 (eight) hours as needed for nausea or vomiting. 06/19/23   Antony Madura, PA-C  OXYGEN Inhale 2 L into the lungs continuous.    [provider]  OZEMPIC, 0.25 OR 0.5 MG/DOSE, 2 MG/3ML SOPN Inject 0.5 mg into the skin every Friday. 05/17/23   [provider]  polyethylene glycol (MIRALAX / GLYCOLAX) 17 g packet Take 17 g by mouth daily as needed for mild constipation. Patient not taking: Reported on 02/02/2023 11/24/22   Laurey Morale, MD  potassium chloride (KLOR-CON) 10 MEQ tablet TAKE 1 TABLET (10 MEQ TOTAL) BY MOUTH 2 (TWO) TIMES DAILY. 05/10/23   Laurey Morale, MD  predniSONE (DELTASONE) 10 MG tablet Take 4 tablets (40 mg total) by mouth daily. 06/11/23   Derwood Kaplan, MD  rosuvastatin (CRESTOR) 10 MG tablet Take 1 tablet (10 mg total) by mouth every evening. 02/02/23   Laurey Morale, MD  senna-docusate (SENOKOT-S) 8.6-50 MG tablet Take 1 tablet by mouth daily. Patient not taking: Reported on 06/07/2023 02/07/23   Avanell Shackleton, NP-C  spironolactone (ALDACTONE) 25 MG tablet Take 1 tablet (25 mg total) by mouth daily. 02/02/23   Laurey Morale, MD  torsemide (DEMADEX) 20 MG tablet Take 2 tablets (40 mg total) by mouth 2 (two) times daily. 11/24/22   Laurey Morale, MD  traZODone (DESYREL) 50 MG tablet Take 2 tablets (100 mg total) by mouth at bedtime. Patient not taking: Reported on 06/07/2023 02/07/23   Avanell Shackleton, NP-C  umeclidinium-vilanterol (ANORO ELLIPTA) 62.5-25 MCG/ACT AEPB Inhale 1 puff into the lungs daily. 02/09/23   Henson, Vickie L, NP-C  Vitamin D, Ergocalciferol, (DRISDOL) 1.25 MG (50000 UNIT) CAPS capsule Take 50,000 Units by mouth once  a week. 06/05/23   [provider]  zolpidem (AMBIEN) 10 MG tablet Take 10 mg by mouth at bedtime.    [provider]      Allergies    Bee venom, Fioricet [butalbital-apap-caffeine], Ibuprofen, Lamisil [terbinafine], and Nsaids    Review of Systems   Review of Systems  Physical Exam Updated Vital Signs BP (!) 107/54 (BP Location: Right Arm)   Pulse 86   Temp 98.7 F (37.1 C) (Oral)   Resp 20   SpO2 96%  Physical Exam Vitals reviewed.  Constitutional:      General: She is not in acute distress. Cardiovascular:     Rate and Rhythm: Normal rate.     Pulses: Normal pulses.  Musculoskeletal:     Comments: Left  knee: 4 out of 5 knee extension which is baseline, generalized tenderness, negative varus/valgus stress test, anterior/posterior drawer test, able to bear weight; no obvious deformities, no warmth or overlying skin color changes, no hip tenderness Pain not out of proportion Soft compartments  Skin:    General: Skin is warm and dry.     Capillary Refill: Capillary refill takes less than 2 seconds.  Neurological:     Mental Status: She is alert.     Comments: Sensation intact distally  Psychiatric:        Mood and Affect: Mood normal.     ED Results / Procedures / Treatments   Labs (all labs ordered are listed, but only abnormal results are displayed) Labs Reviewed - No data to display  EKG None  Radiology DG Knee Left Port Result Date: 06/28/2023 CLINICAL DATA:  Chronic, atraumatic left knee pain. EXAM: PORTABLE LEFT KNEE - 1-2 VIEW COMPARISON:  May 13, 2023 FINDINGS: No evidence of an acute fracture, dislocation, or joint effusion. There is moderate severity tricompartmental joint space narrowing. Moderate severity lateral marginal osteophyte formation is also seen. Soft tissues are unremarkable. IMPRESSION: Moderate severity tricompartmental degenerative changes. Electronically Signed   By: Aram Candela M.D.   On: 06/28/2023 20:11     Procedures Procedures    Medications Ordered in ED Medications  oxyCODONE (Oxy IR/ROXICODONE) immediate release tablet 5 mg (has no administration in time range)  busPIRone (BUSPAR) tablet 10 mg (has no administration in time range)    ED Course/ Medical Decision Making/ A&P                                 Medical Decision Making Amount and/or Complexity of Data Reviewed Radiology: ordered.  Risk Prescription drug management.   Michele Owens 63 y.o. presented today for left knee pain. Working DDx that I considered at this time includes, but not limited to, arthritis, contusion, strain/sprain, fracture, dislocation, neurovascular compromise, septic joint, ischemic limb, compartment syndrome.  R/o DDx: contusion, strain/sprain, fracture, dislocation, neurovascular compromise, septic joint, ischemic limb, compartment syndrome: These are considered less likely due to history of present illness, physical exam, labs/imaging findings.  Review of prior external notes: 06/18/2023 ED  Unique Tests and My Independent Interpretation:  Left knee x-ray: Arthritis noted  Social Determinants of Health: none  Discussion with Independent Historian: None  Discussion of Management of Tests: None  Risk: Medium: prescription drug management  Risk Stratification Score: None  Plan: On exam patient was no acute distress stable vitals.  Patient was neuro vastly intact and does have tenderness throughout her left knee.  Patient is able to bear weight and does have 4 out of 5 strength when comes to left knee extension compared to 5 out of 5 right knee strength however states that this is chronic.  X-ray from triage does show arthritis to which patient states this is her normal arthritis pain and wants to be treated for arthritis.  Patient states she has not oxycodone but has not been able to take it.  Patient sees orthopedics and has an appointment in April 2 and would like to follow-up with  them after being discharged.  Will give oxycodone here along with her BuSpar as patient states that she needs her BuSpar as she missed her dose this evening so we will order this as well.  Patient was now endorsing a red flag symptoms that would necessitate further workup  at this time.  Will order hinged knee brace at patient's request.  Patient's physical exam and imaging are reassuring.  I spoke to the patient about RICE therapy including Tylenol every 6 hours needed pain, ice 3-4 times daily for 15 minutes at a time, elevation of extremity, using a brace and to follow-up with their primary care provider.  Patient was given return precautions. Patient stable for discharge at this time.  Patient verbalized understanding of plan.  This chart was dictated using voice recognition software.  Despite best efforts to proofread,  errors can occur which can change the documentation meaning.        Final Clinical Impression(s) / ED Diagnoses Final diagnoses:  Arthritis    Rx / DC Orders ED Discharge Orders     None         Remi Deter 06/28/23 2252    Anders Simmonds T, DO 06/28/23 2317

## 2023-07-04 ENCOUNTER — Other Ambulatory Visit: Payer: Self-pay | Admitting: Family Medicine

## 2023-07-04 DIAGNOSIS — F419 Anxiety disorder, unspecified: Secondary | ICD-10-CM

## 2023-07-17 ENCOUNTER — Other Ambulatory Visit (HOSPITAL_COMMUNITY): Payer: Self-pay | Admitting: Cardiology

## 2023-07-17 DIAGNOSIS — E876 Hypokalemia: Secondary | ICD-10-CM

## 2023-07-17 IMAGING — CR DG CHEST 2V
2 series · 2 of 2 positions shown · non-contrast
Comparison: 10/19/2020

CLINICAL DATA: Shortness of breath

EXAM:
CHEST - 2 VIEW

[w chest lat]
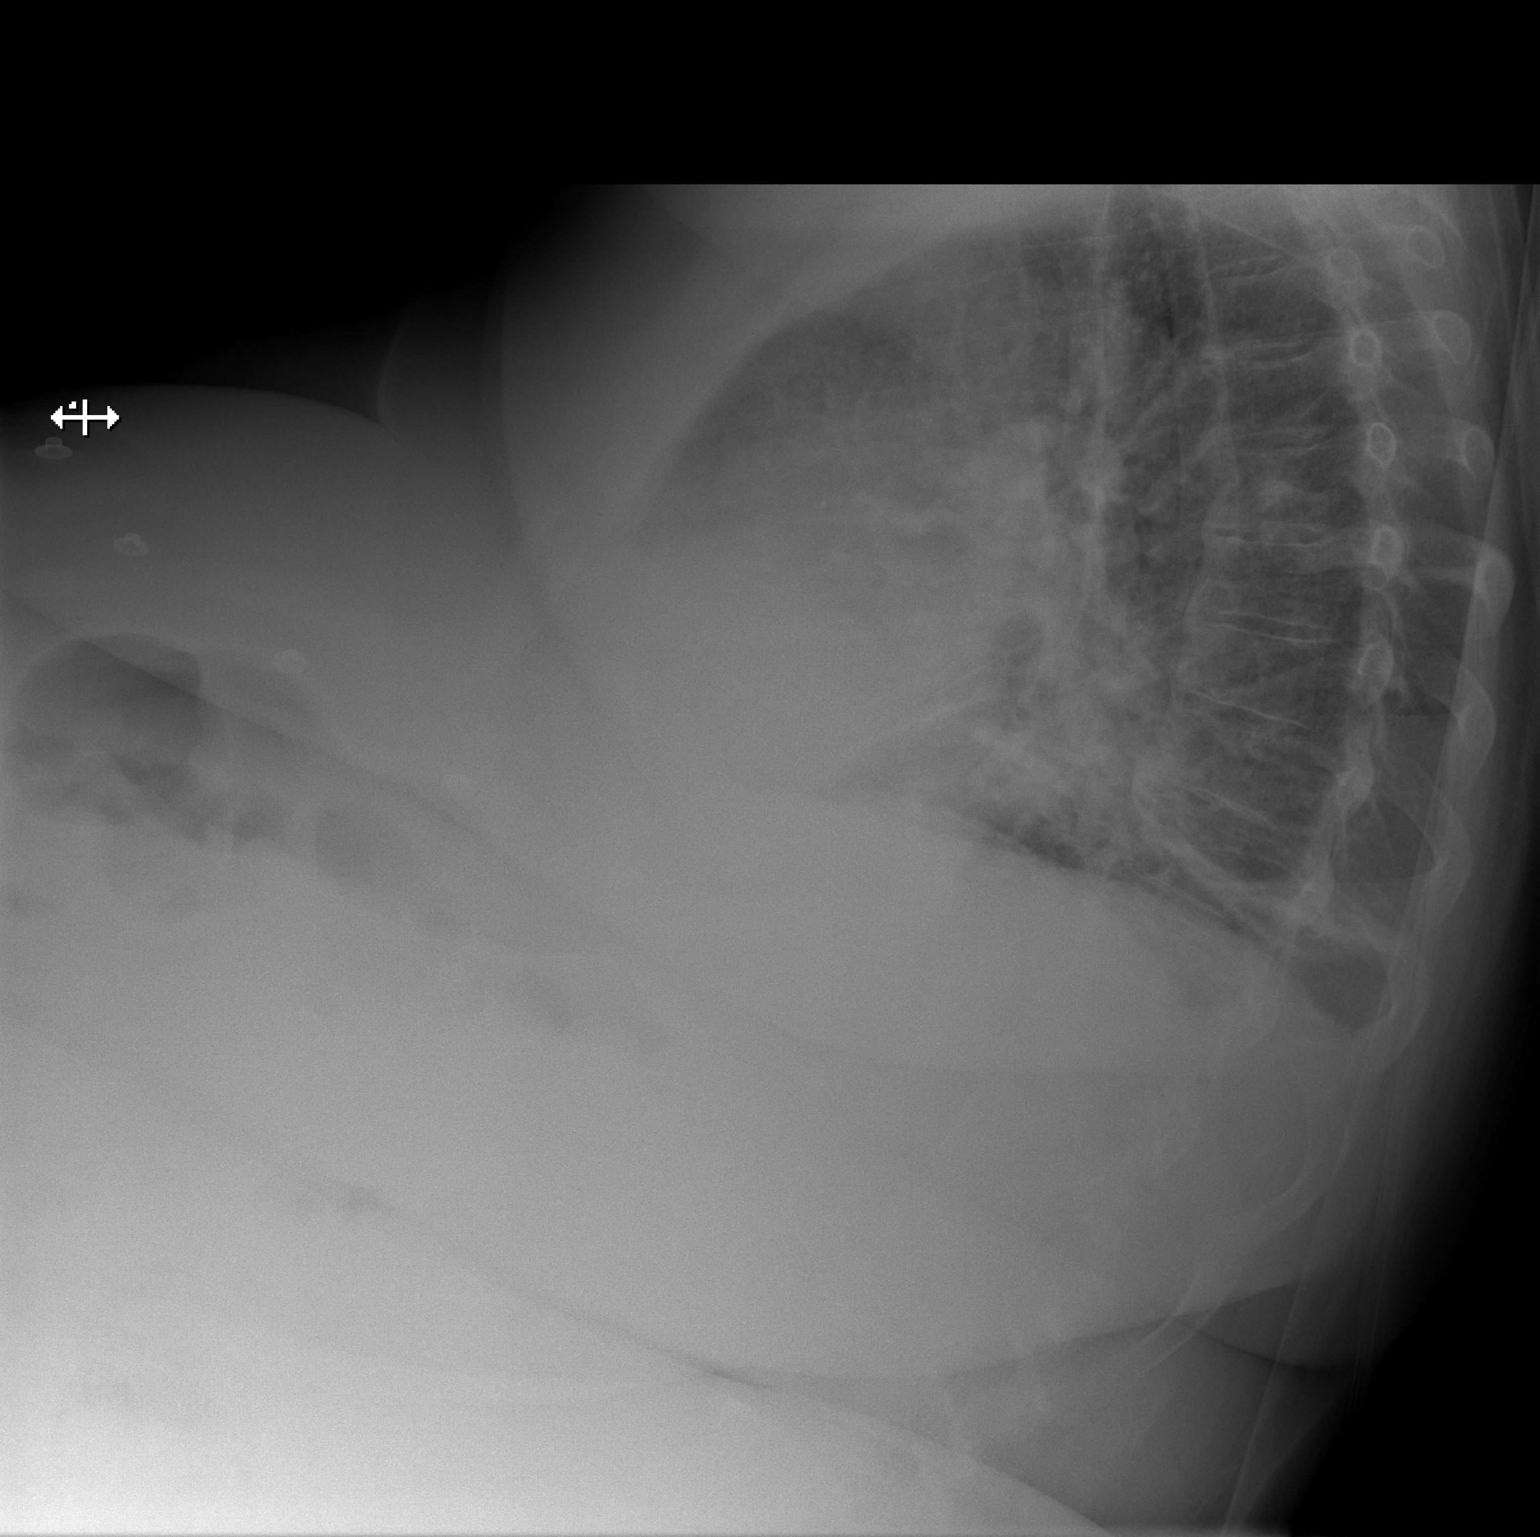

[x chest ap]
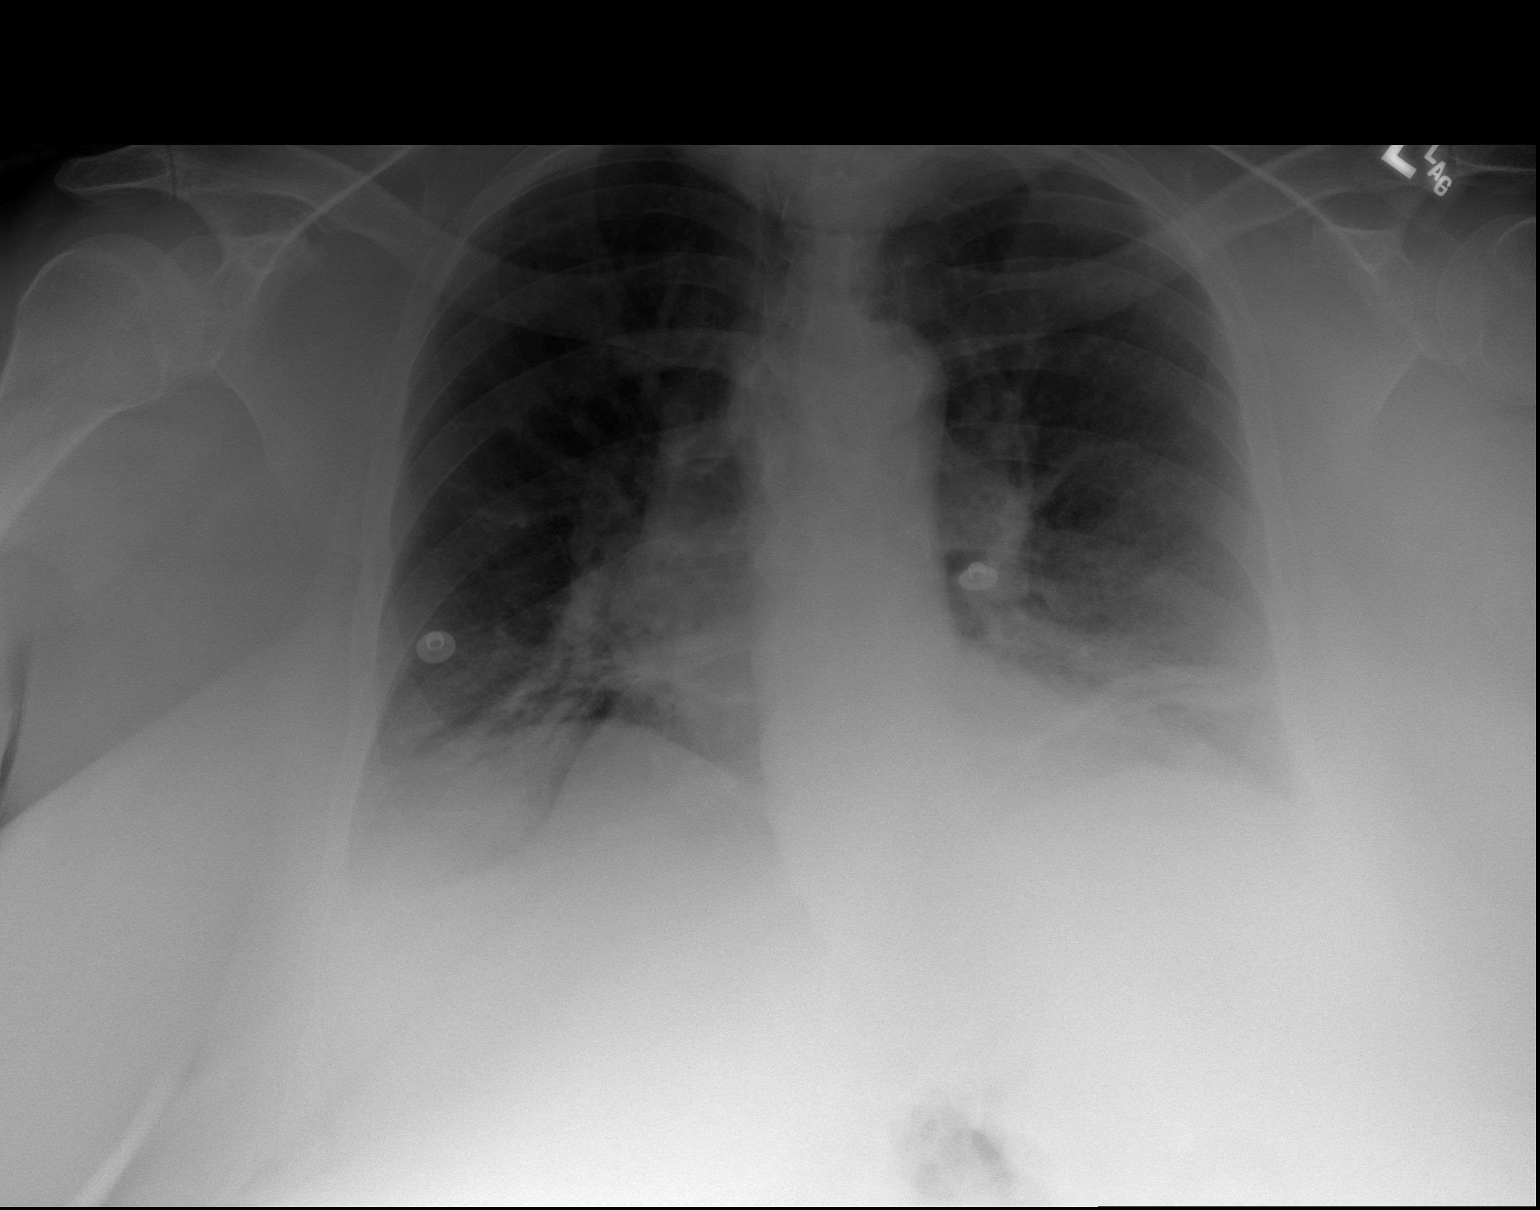

[2 of 2 positions shown; findings below may reference images not displayed]

FINDINGS: Bandlike atelectasis or scarring at the bases. No pleural effusion.
Stable cardiomegaly with aortic atherosclerosis. No pneumothorax.
IMPRESSION: Chronic bandlike opacities at the bases suggestive of atelectasis or
scarring. Cardiomegaly

## 2023-07-17 MED ORDER — POTASSIUM CHLORIDE ER 10 MEQ PO TBCR: 10.0000 meq | EXTENDED_RELEASE_TABLET | Freq: Two times a day (BID) | ORAL | 6 refills | Status: DC

## 2023-07-27 ENCOUNTER — Other Ambulatory Visit: Payer: Self-pay | Admitting: Family Medicine

## 2023-08-01 ENCOUNTER — Other Ambulatory Visit (HOSPITAL_COMMUNITY): Payer: Self-pay | Admitting: Cardiology

## 2023-08-03 ENCOUNTER — Telehealth (HOSPITAL_COMMUNITY): Payer: Self-pay | Admitting: Cardiology

## 2023-08-03 NOTE — Telephone Encounter (Signed)
 Patient called to request refill on buspar    Advised pt contact PCP for refills  Pt and pts home aide voiced understanding

## 2023-08-06 ENCOUNTER — Other Ambulatory Visit: Payer: Self-pay | Admitting: Family Medicine

## 2023-08-06 ENCOUNTER — Telehealth (HOSPITAL_COMMUNITY): Payer: Self-pay | Admitting: Cardiology

## 2023-08-06 NOTE — Telephone Encounter (Signed)
 Patient called to request refill of ferrous sulfate  325mg   Advised pt to contact PCP for additional refills

## 2023-08-15 IMAGING — DX DG CHEST 1V PORT
1 series · 1 of 1 positions shown · non-contrast
Comparison: 11/10/2020

CLINICAL DATA: Tachycardia, SVT

EXAM:
PORTABLE CHEST 1 VIEW

[chest ap]
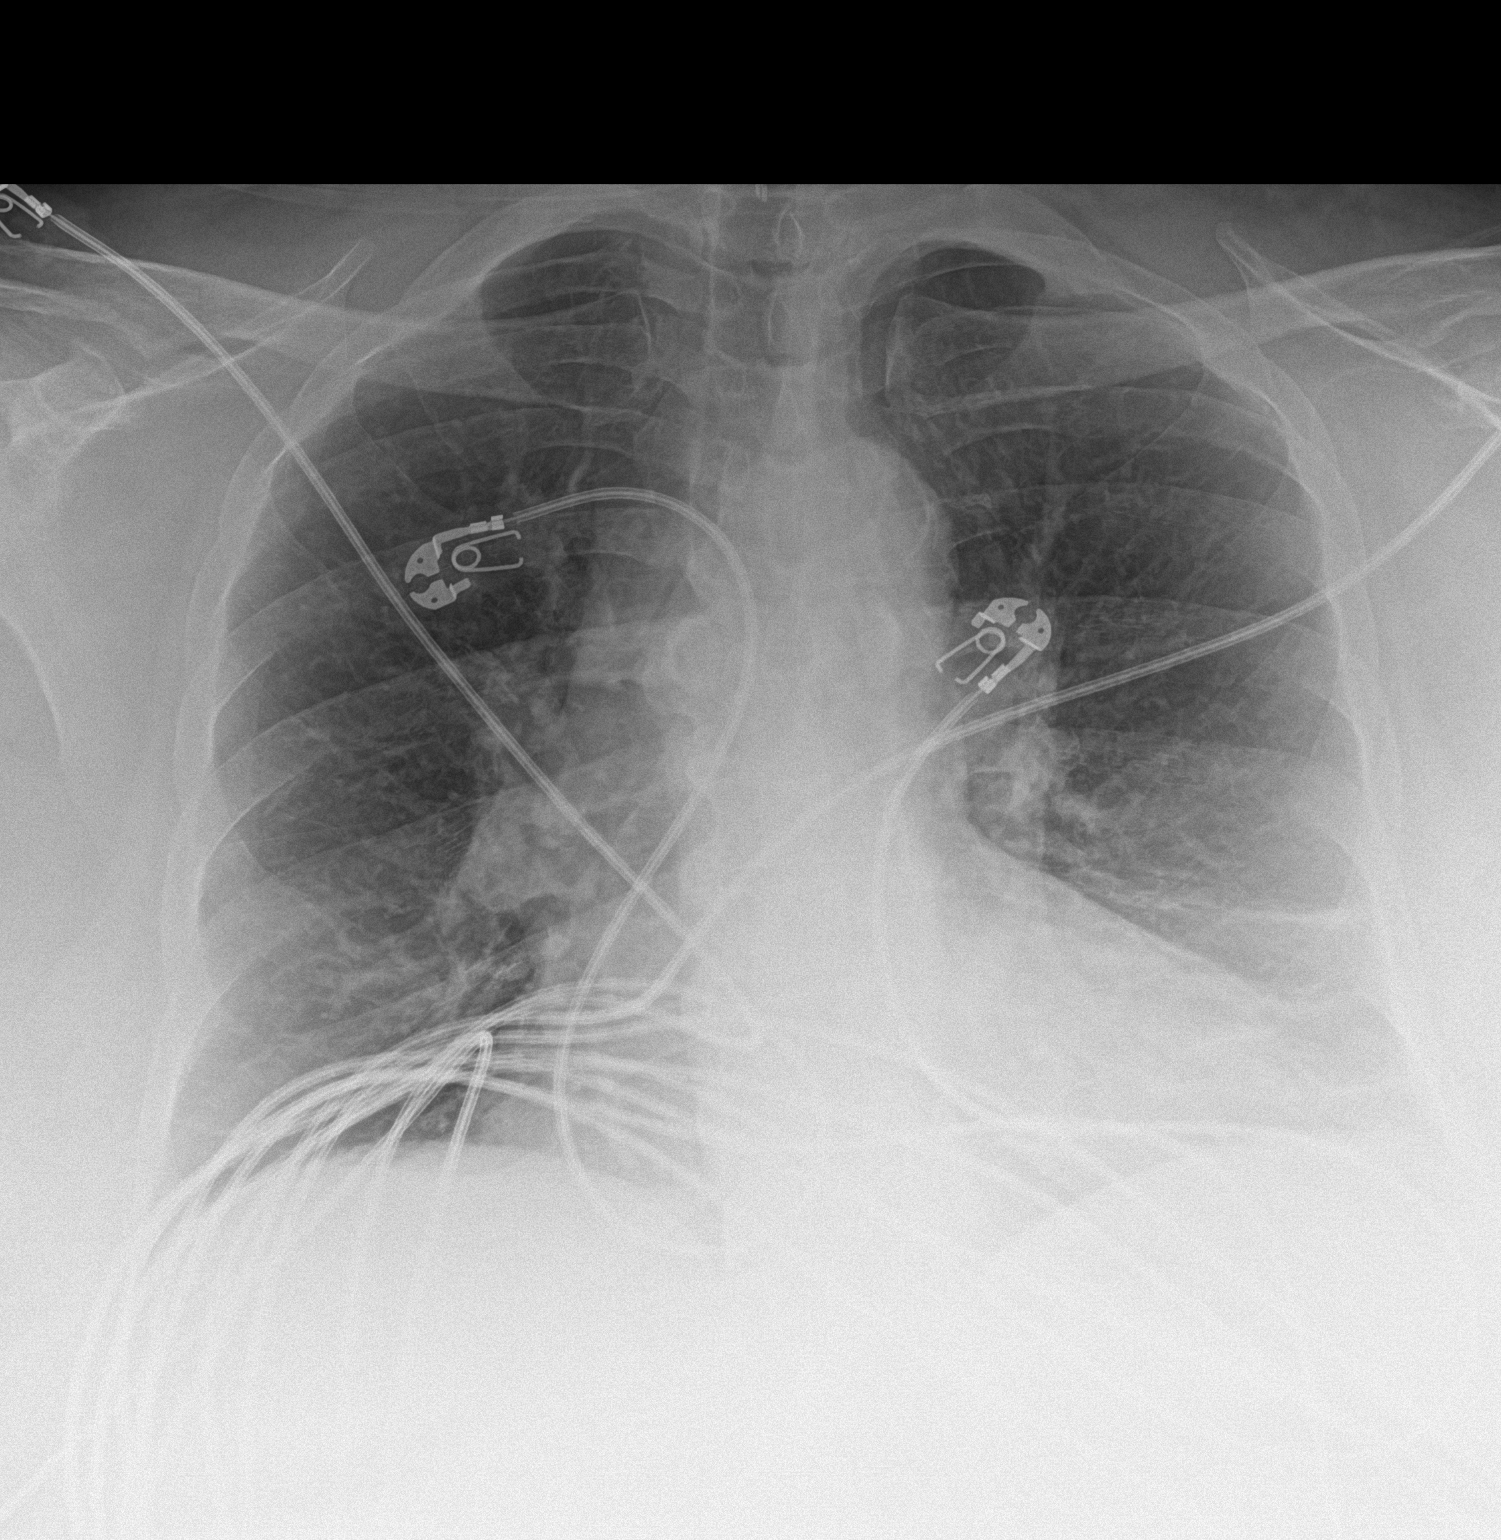

[1 of 1 positions shown; findings below may reference images not displayed]

FINDINGS: Lingular/left lower lobe scarring. Right lung is clear. No pleural
effusion or pneumothorax.

The heart is normal in size.  Thoracic aortic atherosclerosis.

Surgical clips overlying the neck.
IMPRESSION: No evidence of acute cardiopulmonary disease.

## 2023-08-22 ENCOUNTER — Telehealth: Payer: Self-pay

## 2023-08-22 NOTE — Telephone Encounter (Signed)
 Appointment reminder letter placed in mail  Copied from CRM [id number]. Topic: General - Other >> Aug 21, 2023  4:26 PM [name] wrote: Reason for CRM: Pt would like an appointment card mailed to home with address date/time of appt,

## 2023-08-28 ENCOUNTER — Encounter (HOSPITAL_COMMUNITY): Admitting: Cardiology

## 2023-08-28 ENCOUNTER — Emergency Department (HOSPITAL_COMMUNITY)
Admission: EM | Admit: 2023-08-28 | Discharge: 2023-08-29 | Disposition: A | Discharge status: Home / Self Care | Attending: Emergency Medicine | Admitting: Emergency Medicine

## 2023-08-28 ENCOUNTER — Other Ambulatory Visit: Payer: Self-pay

## 2023-08-28 DIAGNOSIS — D72829 Elevated white blood cell count, unspecified: Secondary | ICD-10-CM | POA: Diagnosis not present

## 2023-08-28 DIAGNOSIS — R197 Diarrhea, unspecified: Secondary | ICD-10-CM | POA: Insufficient documentation

## 2023-08-28 DIAGNOSIS — R112 Nausea with vomiting, unspecified: Secondary | ICD-10-CM | POA: Insufficient documentation

## 2023-08-28 DIAGNOSIS — I509 Heart failure, unspecified: Secondary | ICD-10-CM | POA: Insufficient documentation

## 2023-08-28 DIAGNOSIS — Z7982 Long term (current) use of aspirin: Secondary | ICD-10-CM | POA: Insufficient documentation

## 2023-08-28 DIAGNOSIS — R109 Unspecified abdominal pain: Secondary | ICD-10-CM | POA: Diagnosis present

## 2023-08-28 DIAGNOSIS — J449 Chronic obstructive pulmonary disease, unspecified: Secondary | ICD-10-CM | POA: Insufficient documentation

## 2023-08-28 DIAGNOSIS — R1031 Right lower quadrant pain: Secondary | ICD-10-CM | POA: Diagnosis not present

## 2023-08-28 DIAGNOSIS — E119 Type 2 diabetes mellitus without complications: Secondary | ICD-10-CM | POA: Diagnosis not present

## 2023-08-28 NOTE — ED Triage Notes (Addendum)
 Patient brought in by EMS for abdominal pain. Stated it has been going on since mothers day and the pain moves around. Patient stated the pain started after eating Apple bees. Patient complained of lower back pain. No nausea vomiting reported. VS stable with medic. Pain 10/10. No other complaints at this time.

## 2023-08-29 ENCOUNTER — Emergency Department (HOSPITAL_COMMUNITY)

## 2023-08-29 ENCOUNTER — Encounter (HOSPITAL_COMMUNITY): Payer: Self-pay

## 2023-08-29 LAB — URINALYSIS, ROUTINE W REFLEX MICROSCOPIC
Bacteria, UA: NONE SEEN
Bilirubin Urine: NEGATIVE
Glucose, UA: >=500 mg/dL — AB
Hgb urine dipstick: NEGATIVE
Ketones, ur: NEGATIVE mg/dL
Leukocytes,Ua: NEGATIVE
Nitrite: NEGATIVE
Protein, ur: NEGATIVE mg/dL
Specific Gravity, Urine: 1.02 (ref 1.005–1.030)
pH: 5 (ref 5.0–8.0)

## 2023-08-29 LAB — CBC WITH DIFFERENTIAL/PLATELET
Abs Immature Granulocytes: 0.13 10*3/uL — ABNORMAL HIGH (ref 0.00–0.07)
Basophils Absolute: 0.1 10*3/uL (ref 0.0–0.1)
Basophils Relative: 1 %
Eosinophils Absolute: 0.2 10*3/uL (ref 0.0–0.5)
Eosinophils Relative: 1 %
HCT: 40 % (ref 36.0–46.0)
Hemoglobin: 12.4 g/dL (ref 12.0–15.0)
Immature Granulocytes: 1 %
Lymphocytes Relative: 16 %
Lymphs Abs: 2.5 10*3/uL (ref 0.7–4.0)
MCH: 30.2 pg (ref 26.0–34.0)
MCHC: 31 g/dL (ref 30.0–36.0)
MCV: 97.6 fL (ref 80.0–100.0)
Monocytes Absolute: 1 10*3/uL (ref 0.1–1.0)
Monocytes Relative: 6 %
Neutro Abs: 12.2 10*3/uL — ABNORMAL HIGH (ref 1.7–7.7)
Neutrophils Relative %: 75 %
Platelets: 336 10*3/uL (ref 150–400)
RBC: 4.1 MIL/uL (ref 3.87–5.11)
RDW: 13.2 % (ref 11.5–15.5)
WBC: 16 10*3/uL — ABNORMAL HIGH (ref 4.0–10.5)
nRBC: 0 % (ref 0.0–0.2)

## 2023-08-29 LAB — COMPREHENSIVE METABOLIC PANEL WITH GFR
ALT: 14 U/L (ref 0–44)
AST: 19 U/L (ref 15–41)
Albumin: 4.1 g/dL (ref 3.5–5.0)
Alkaline Phosphatase: 64 U/L (ref 38–126)
Anion gap: 11 (ref 5–15)
BUN: 16 mg/dL (ref 8–23)
CO2: 28 mmol/L (ref 22–32)
Calcium: 8.7 mg/dL — ABNORMAL LOW (ref 8.9–10.3)
Chloride: 97 mmol/L — ABNORMAL LOW (ref 98–111)
Creatinine, Ser: 0.97 mg/dL (ref 0.44–1.00)
GFR, Estimated: >60 mL/min (ref >60)
Glucose, Bld: 117 mg/dL — ABNORMAL HIGH (ref 70–99)
Potassium: 3.5 mmol/L (ref 3.5–5.1)
Sodium: 136 mmol/L (ref 135–145)
Total Bilirubin: 0.6 mg/dL (ref 0.0–1.2)
Total Protein: 7.4 g/dL (ref 6.5–8.1)

## 2023-08-29 LAB — LIPASE, BLOOD: Lipase: 30 U/L (ref 11–51)

## 2023-08-29 IMAGING — DX DG CHEST 1V PORT
1 series · 1 of 1 positions shown · non-contrast
Comparison: December 09, 2020.

CLINICAL DATA: Weakness.

EXAM:
PORTABLE CHEST 1 VIEW

[chest ap]
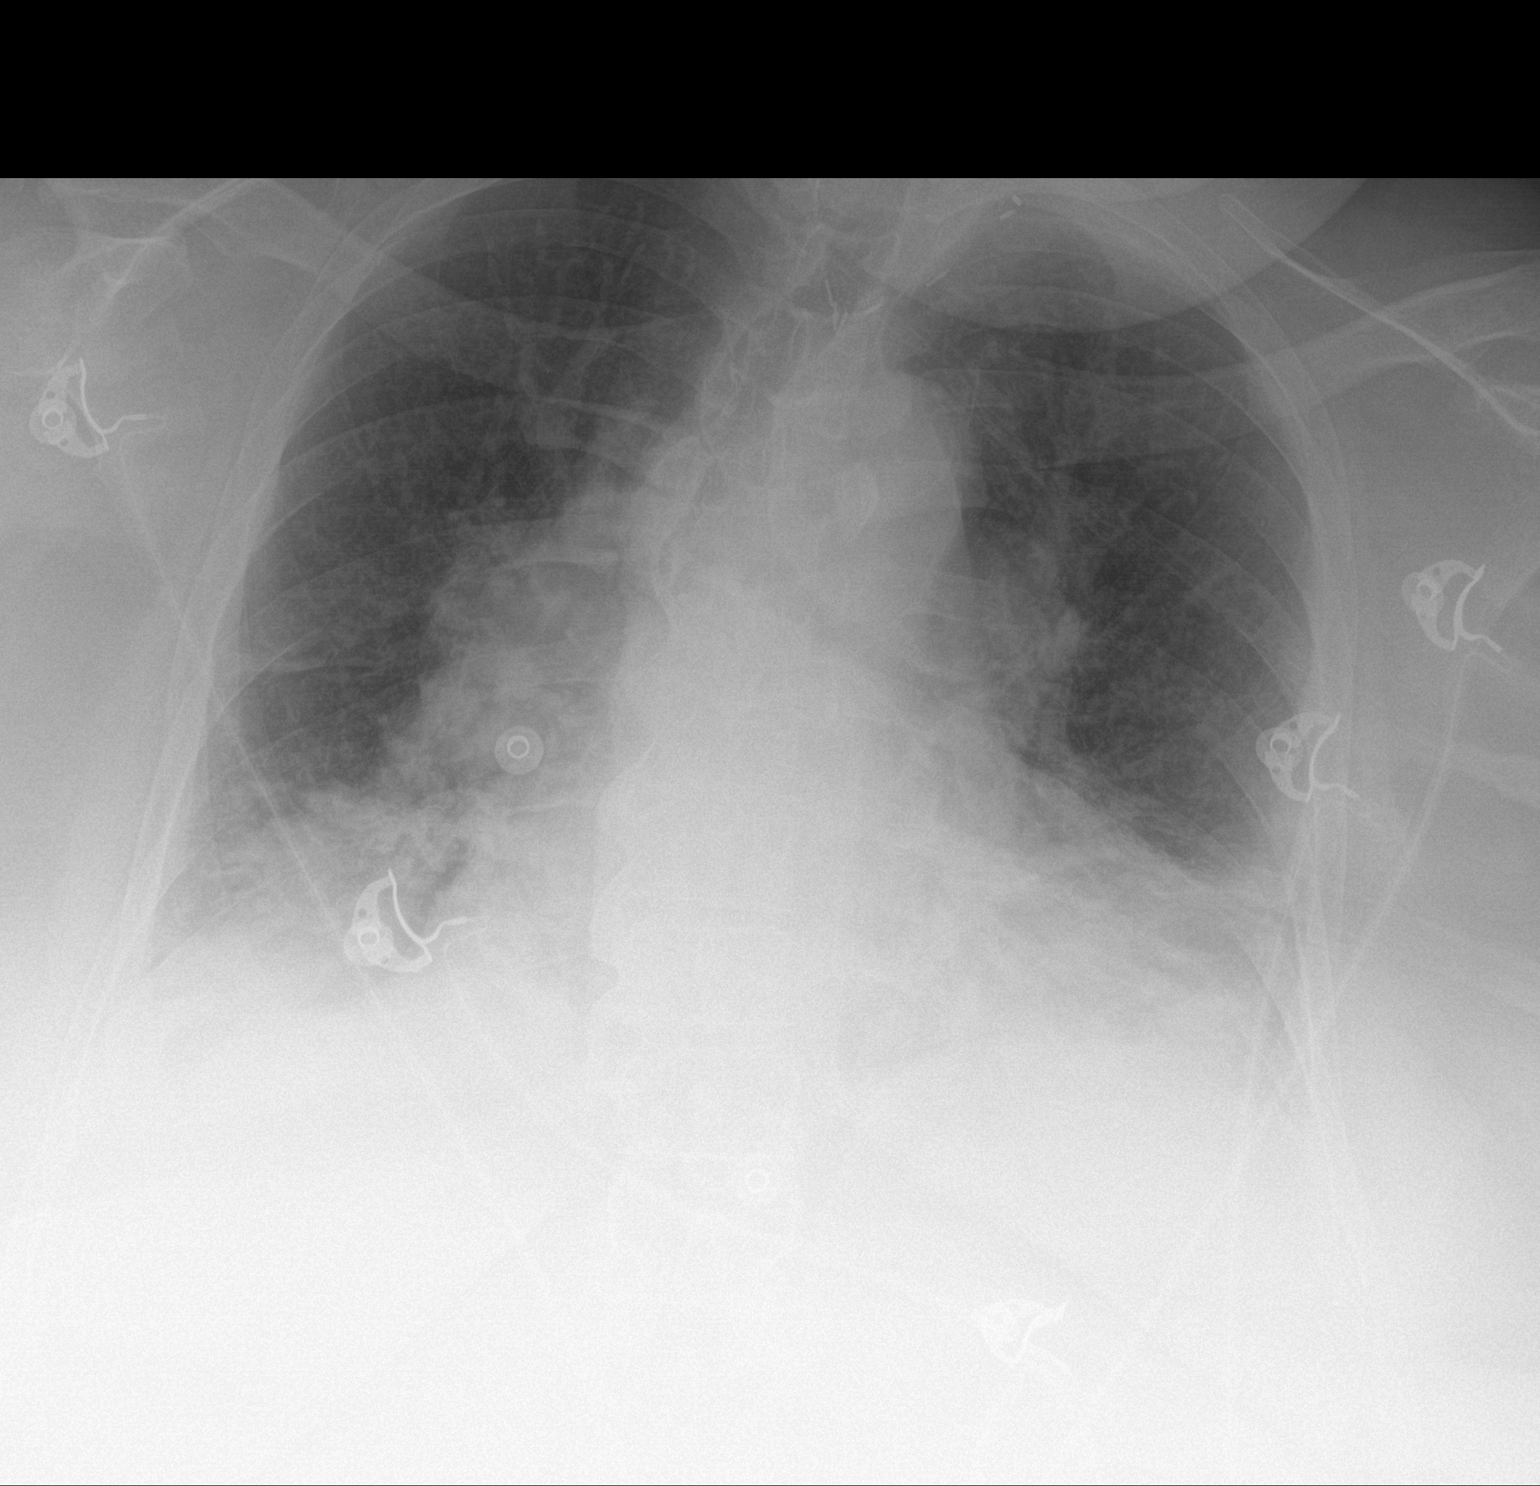

[1 of 1 positions shown; findings below may reference images not displayed]

FINDINGS: Stable cardiomegaly. Mild bibasilar subsegmental atelectasis or
infiltrates are noted. Bony thorax is unremarkable.
IMPRESSION: Mild bibasilar subsegmental atelectasis or infiltrates.

## 2023-08-29 MED ORDER — MORPHINE SULFATE (PF) 4 MG/ML IV SOLN
4.0000 mg | Freq: Once | INTRAVENOUS | Status: DC
  Filled 2023-08-29: qty 1

## 2023-08-29 MED ORDER — DICYCLOMINE HCL 20 MG PO TABS: 20.0000 mg | ORAL_TABLET | Freq: Two times a day (BID) | ORAL | 0 refills | Status: DC

## 2023-08-29 MED ORDER — IOHEXOL 300 MG/ML  SOLN
100.0000 mL | Freq: Once | INTRAMUSCULAR | Status: AC | PRN
Start: 2023-08-29 — End: 2023-08-29
  Administered 2023-08-29: 100 mL via INTRAVENOUS

## 2023-08-29 MED ORDER — ONDANSETRON 4 MG PO TBDP
4.0000 mg | ORAL_TABLET | Freq: Three times a day (TID) | ORAL | 0 refills | Status: DC | PRN
Start: 2023-08-29 — End: 2023-12-25

## 2023-08-29 MED ORDER — MORPHINE SULFATE (PF) 4 MG/ML IV SOLN
4.0000 mg | Freq: Once | INTRAVENOUS | Status: AC
  Administered 2023-08-29: 4 mg via INTRAVENOUS
  Filled 2023-08-29: qty 1

## 2023-08-29 MED ORDER — DICYCLOMINE HCL 10 MG PO CAPS
10.0000 mg | ORAL_CAPSULE | Freq: Once | ORAL | Status: AC
  Administered 2023-08-29: 10 mg via ORAL
  Filled 2023-08-29: qty 1

## 2023-08-29 MED ORDER — ONDANSETRON HCL 4 MG/2ML IJ SOLN
4.0000 mg | Freq: Once | INTRAMUSCULAR | Status: AC
  Administered 2023-08-29: 4 mg via INTRAVENOUS
  Filled 2023-08-29: qty 2

## 2023-08-29 NOTE — ED Provider Notes (Signed)
  EMERGENCY DEPARTMENT AT [facility] Provider Note   CSN: [id number] Arrival date & time: 08/28/23  2331    History  Chief Complaint  Patient presents with   Abdominal Pain    Michele Owens is a 63 y.o. female history of chronic hypoxic respiratory failure, COPD, CHF, diabetes here for evaluation of abdominal pain nausea and diarrhea.  Started at a week ago.  Cannot quantify how many episodes of diarrhea however states "pure water " and "too many to count." No recent abd or travel. Sx started after eating out at a restaurant. Diffuse abd pain to the right mid abd, does not radiate. No fever, CP, SOB, LE edema.  HPI     Home Medications Prior to Admission medications   Medication Sig Start Date End Date Taking? Authorizing Provider  dicyclomine  (BENTYL ) 20 MG tablet Take 1 tablet (20 mg total) by mouth 2 (two) times daily. 08/29/23  Yes Shauntia Levengood A, PA-C  ondansetron  (ZOFRAN -ODT) 4 MG disintegrating tablet Take 1 tablet (4 mg total) by mouth every 8 (eight) hours as needed. 08/29/23  Yes Spring San A, PA-C  ACETAMINOPHEN  PO Take 1,300-2,050 mg by mouth daily as needed for moderate pain (pain score 4-6) or headache.    [provider]  albuterol  (VENTOLIN  HFA) 108 (90 Base) MCG/ACT inhaler Inhale 1-2 puffs into the lungs every 6 (six) hours as needed for wheezing or shortness of breath. 11/24/22   Darlis Eisenmenger, MD  allopurinol  (ZYLOPRIM ) 100 MG tablet Take 100 mg by mouth daily. Patient not taking: Reported on 06/06/2023    [provider]  aspirin  EC 81 MG tablet Take 81 mg by mouth daily. Swallow whole.    [provider]  blood glucose meter kit and supplies KIT Dispense based on patient and insurance preference. Use up to four times daily as directed. 12/25/20   Haydee Lipa, MD  budesonide  (PULMICORT ) 0.25 MG/2ML nebulizer solution Inhale 2 mLs (0.25 mg total) by nebulization 2 (two) times daily. Patient taking  differently: Take 0.25 mg by nebulization daily as needed (for shortness of breath or wheezing). 11/24/22   Darlis Eisenmenger, MD  busPIRone  (BUSPAR ) 5 MG tablet Take 1 tablet (5 mg total) by mouth 3 (three) times daily. Patient taking differently: Take 10 mg by mouth 3 (three) times daily. 02/07/23   Henson, Vickie L, NP-C  butalbital -acetaminophen -caffeine  (FIORICET ) 50-325-40 MG tablet Take 1-2 tablets by mouth every 6 (six) hours as needed for headache. 05/08/23 05/07/24  Denese Finn, PA-C  calcitRIOL  (ROCALTROL ) 0.5 MCG capsule Take 1 capsule (0.5 mcg total) by mouth daily. Patient not taking: Reported on 06/07/2023 09/26/16   Hongalgi, Anand D, MD  carvedilol  (COREG ) 6.25 MG tablet Take 1 tablet (6.25 mg total) by mouth 2 (two) times daily. 05/23/23   [place], Arlice Bene, FNP  doxycycline  (VIBRAMYCIN ) 100 MG capsule Take 1 capsule (100 mg total) by mouth 2 (two) times daily. 06/11/23   Deatra Face, MD  EPINEPHrine  0.3 mg/0.3 mL IJ SOAJ injection Inject 0.3 mg into the muscle once as needed for anaphylaxis.    [provider]  FARXIGA  10 MG TABS tablet Take 1 tablet (10 mg total) by mouth daily before breakfast. 02/02/23   Darlis Eisenmenger, MD  fenofibrate  (TRICOR ) 145 MG tablet Take 1 tablet (145 mg total) by mouth daily. 02/02/23   Darlis Eisenmenger, MD  FEROSUL 325 (65 Fe) MG tablet Take 1 tablet (325 mg total) by mouth daily. 02/09/23  Henson, Vickie L, NP-C  levocetirizine (XYZAL ) 5 MG tablet Take 1 tablet (5 mg total) by mouth at bedtime. 11/24/22   Darlis Eisenmenger, MD  levothyroxine  (SYNTHROID ) 125 MCG tablet Take 125 mcg by mouth at bedtime. 05/07/23   [provider]  losartan  (COZAAR ) 25 MG tablet Take 1 tablet (25 mg total) by mouth daily. 02/02/23   Darlis Eisenmenger, MD  nitroGLYCERIN  (NITROSTAT ) 0.4 MG SL tablet Place 1 tablet (0.4 mg total) under the tongue every 5 (five) minutes as needed for chest pain. 05/23/23   Elmarie Hacking, FNP  nystatin  cream (MYCOSTATIN )  Apply 1 Application topically 2 (two) times daily as needed for dry skin. 09/26/22   [provider]  omeprazole  (PRILOSEC) 20 MG capsule Take 1 capsule (20 mg total) by mouth 2 (two) times daily as needed (acid reflux). Patient taking differently: Take 40 mg by mouth daily. 11/24/22   Darlis Eisenmenger, MD  ondansetron  (ZOFRAN ) 4 MG tablet Take 1 tablet (4 mg total) by mouth every 8 (eight) hours as needed for nausea or vomiting. 06/19/23   Carleton Cheek, PA-C  OXYGEN  Inhale 2 L into the lungs continuous.    [provider]  OZEMPIC, 0.25 OR 0.5 MG/DOSE, 2 MG/3ML SOPN Inject 0.5 mg into the skin every Friday. 05/17/23   [provider]  polyethylene glycol (MIRALAX  / GLYCOLAX ) 17 g packet Take 17 g by mouth daily as needed for mild constipation. Patient not taking: Reported on 02/02/2023 11/24/22   Darlis Eisenmenger, MD  potassium chloride  (KLOR-CON ) 10 MEQ tablet Take 1 tablet (10 mEq total) by mouth 2 (two) times daily. 07/17/23   Darlis Eisenmenger, MD  predniSONE  (DELTASONE ) 10 MG tablet Take 4 tablets (40 mg total) by mouth daily. 06/11/23   Nanavati, Ankit, MD  rosuvastatin  (CRESTOR ) 10 MG tablet Take 1 tablet (10 mg total) by mouth every evening. 02/02/23   Darlis Eisenmenger, MD  senna-docusate (SENOKOT-S) 8.6-50 MG tablet Take 1 tablet by mouth daily. Patient not taking: Reported on 06/07/2023 02/07/23   Abram Abraham, NP-C  spironolactone  (ALDACTONE ) 25 MG tablet Take 1 tablet (25 mg total) by mouth daily. 02/02/23   Darlis Eisenmenger, MD  torsemide  (DEMADEX ) 20 MG tablet Take 2 tablets (40 mg total) by mouth 2 (two) times daily. 11/24/22   Darlis Eisenmenger, MD  traZODone  (DESYREL ) 50 MG tablet Take 2 tablets (100 mg total) by mouth at bedtime. Patient not taking: Reported on 06/07/2023 02/07/23   Alyson Back L, NP-C  umeclidinium-vilanterol (ANORO ELLIPTA ) 62.5-25 MCG/ACT AEPB Inhale 1 puff into the lungs daily. 02/09/23   Henson, Vickie L, NP-C  Vitamin D, Ergocalciferol,  (DRISDOL) 1.25 MG (50000 UNIT) CAPS capsule Take 50,000 Units by mouth once a week. 06/05/23   [provider]  zolpidem  (AMBIEN ) 10 MG tablet Take 10 mg by mouth at bedtime.    [provider]      Allergies    Bee venom, Fioricet  [butalbital -apap-caffeine ], Ibuprofen , Lamisil  [terbinafine ], and Nsaids    Review of Systems   Review of Systems  Constitutional: Negative.   HENT: Negative.    Respiratory: Negative.    Cardiovascular: Negative.   Gastrointestinal:  Positive for abdominal pain, diarrhea and nausea. Negative for abdominal distention, constipation, rectal pain and vomiting.  Genitourinary: Negative.   Musculoskeletal: Negative.   Skin: Negative.   Neurological: Negative.   All other systems reviewed and are negative.   Physical Exam Updated Vital Signs BP (!) 154/56  Pulse 92   Temp 98.5 F (36.9 C)   Resp 18   SpO2 93%  Physical Exam Vitals and nursing note reviewed.  Constitutional:      General: She is not in acute distress.    Appearance: She is well-developed. She is not ill-appearing, toxic-appearing or diaphoretic.  HENT:     Head: Atraumatic.     Nose:     Comments: 2 L [place] at baseline Eyes:     Pupils: Pupils are equal, round, and reactive to light.  Cardiovascular:     Rate and Rhythm: Tachycardia present.     Heart sounds: Normal heart sounds.  Pulmonary:     Effort: Pulmonary effort is normal. No respiratory distress.     Breath sounds: Normal breath sounds.  Abdominal:     General: Bowel sounds are normal. There is no distension.     Palpations: Abdomen is soft.     Tenderness: There is generalized abdominal tenderness and tenderness in the right upper quadrant and right lower quadrant.     Comments: Difficult exam due to body habitus however some generalized tenderness to right abdomen.  Musculoskeletal:        General: Normal range of motion.     Cervical back: Normal range of motion.     Comments: No bony tenderness,  compartments soft, full range of motion  Skin:    General: Skin is warm and dry.     Capillary Refill: Capillary refill takes less than 2 seconds.     Comments: No obvious rashes or lesions to exposed skin  Neurological:     General: No focal deficit present.     Mental Status: She is alert.     Cranial Nerves: Cranial nerves 2-12 are intact.     Sensory: Sensation is intact.     Gait: Gait is intact.  Psychiatric:        Mood and Affect: Mood normal.    ED Results / Procedures / Treatments   Labs (all labs ordered are listed, but only abnormal results are displayed) Labs Reviewed  COMPREHENSIVE METABOLIC PANEL WITH GFR - Abnormal; Notable for the following components:      Result Value   Chloride 97 (*)    Glucose, Bld 117 (*)    Calcium  8.7 (*)    All other components within normal limits  CBC WITH DIFFERENTIAL/PLATELET - Abnormal; Notable for the following components:   WBC 16.0 (*)    Neutro Abs 12.2 (*)    Abs Immature Granulocytes 0.13 (*)    All other components within normal limits  URINALYSIS, ROUTINE W REFLEX MICROSCOPIC - Abnormal; Notable for the following components:   Glucose, UA >=500 (*)    All other components within normal limits  GASTROINTESTINAL PANEL BY PCR, STOOL (REPLACES STOOL CULTURE)  C DIFFICILE QUICK SCREEN W PCR REFLEX    LIPASE, BLOOD    EKG None  Radiology CT ABDOMEN PELVIS W CONTRAST Result Date: 08/29/2023 CLINICAL DATA:  Acute nonlocalized abdominal pain, right abdominal pain, low back pain EXAM: CT ABDOMEN AND PELVIS WITH CONTRAST TECHNIQUE: Multidetector CT imaging of the abdomen and pelvis was performed using the standard protocol following bolus administration of intravenous contrast. RADIATION DOSE REDUCTION: This exam was performed according to the departmental dose-optimization program which includes automated exposure control, adjustment of the mA and/or kV according to patient size and/or use of iterative reconstruction technique.  CONTRAST:  OMNIPAQUE  IOHEXOL  300 MG/ML  SOLN COMPARISON:  06/06/2023 FINDINGS: Lower chest:  No acute abnormality. Hepatobiliary: Mild hepatomegaly and hepatic steatosis. No enhancing intrahepatic mass. No intra or extrahepatic biliary ductal dilation. Gallbladder unremarkable. Pancreas: Unremarkable Spleen: Unremarkable Adrenals/Urinary Tract: Adrenal glands are unremarkable. Kidneys are normal, without renal calculi, focal lesion, or hydronephrosis. Bladder is unremarkable. Stomach/Bowel: Stomach is within normal limits. Appendix appears normal. No evidence of bowel wall thickening, distention, or inflammatory changes. Vascular/Lymphatic: No significant vascular findings are present. No enlarged abdominal or pelvic lymph nodes. Reproductive: Status post hysterectomy. No adnexal masses. Other: No abdominal wall hernia or abnormality. No abdominopelvic ascites. Musculoskeletal: No acute bone abnormality. No lytic or blastic bone lesion. Osseous structures are age appropriate. IMPRESSION: 1. No acute intra-abdominal pathology identified. 2. Mild hepatomegaly and hepatic steatosis. Electronically Signed   By: Worthy Heads M.D.   On: 08/29/2023 02:38    Procedures Procedures    Medications Ordered in ED Medications  ondansetron  (ZOFRAN ) injection 4 mg (4 mg Intravenous Given 08/29/23 0057)  morphine  (PF) 4 MG/ML injection 4 mg (4 mg Intravenous Given 08/29/23 0057)  iohexol  (OMNIPAQUE ) 300 MG/ML solution 100 mL (100 mLs Intravenous Contrast Given 08/29/23 0205)  dicyclomine  (BENTYL ) capsule 10 mg (10 mg Oral Given 08/29/23 0420)    ED Course/ Medical Decision Making/ A&P    63 year old here for evaluation of right side abdominal pain as well as some nausea and diarrhea started about a week ago after visiting a restaurant from [place].  No sick contacts.  Other people she ate with her not sick.  No fever, chest pain, shortness of breath.  She is on chronic oxygen  for COPD, CHF which is at  baseline.  No PND, orthopnea, increase in lower extremity swelling.  No recent travel or antibiotics.  No blood in stool.  Plan on labs, imaging, reassess  Labs and imaging personally viewed and interpreted:  CBC leukocytosis 16 Lipase 30 Metabolic panel without significant abnormality UA neg for infection, show glucosuria however no evidence of DKA, HHS based off the history and labs CT AP not significant abnormality Stool study unable to provide stool sample while here  Patient reassessed.  Discussed labs and imaging.  She is able to tolerate sandwich as well as ginger ale here in ED.  Unfortunately unable to provide stool sample.  Will provide symptomatic management in the meantime.  Encourage close follow-up with PCP, sent home with stool cup see if she can get a stool sample which she can bring to her PCP office.  Will have her return for any new or worsening symptoms.  Patient is nontoxic, nonseptic appearing, in no apparent distress.  Patient's pain and other symptoms adequately managed in emergency department.  Fluid bolus given.  Labs, imaging and vitals reviewed.  Patient does not meet the SIRS or Sepsis criteria.  On repeat exam patient does not have a surgical abdomin and there are no peritoneal signs.  No indication of appendicitis, bowel obstruction, bowel perforation, cholecystitis, diverticulitis, PID, intermittent/persistent torsion, TOA, ectopic pregnancy, AAA, dissection..  Patient discharged home with symptomatic treatment and given strict instructions for follow-up with their primary care physician.  I have also discussed reasons to return immediately to the ER.  Patient expresses understanding and agrees with plan.                                 Medical Decision Making Amount and/or Complexity of Data Reviewed External Data Reviewed: labs, radiology and notes. Labs: ordered. Decision-making details documented  in ED Course. Radiology: ordered and independent interpretation  performed. Decision-making details documented in ED Course.  Risk OTC drugs. Prescription drug management. Parenteral controlled substances. Decision regarding hospitalization. Diagnosis or treatment significantly limited by social determinants of health.          Final Clinical Impression(s) / ED Diagnoses Final diagnoses:  Right lower quadrant abdominal pain  Nausea vomiting and diarrhea    Rx / DC Orders ED Discharge Orders          Ordered    dicyclomine  (BENTYL ) 20 MG tablet  2 times daily        08/29/23 0423    ondansetron  (ZOFRAN -ODT) 4 MG disintegrating tablet  Every 8 hours PRN        08/29/23 0423              Mariaeduarda Defranco A, PA-C 08/29/23 0426    Eldon Greenland, MD 08/29/23 [phone]

## 2023-08-29 NOTE — ED Notes (Signed)
 Pt was given gingerale, graham crackers, peanut butter, and a [place] sandwich

## 2023-08-29 NOTE — Discharge Instructions (Addendum)
 It was a pleasure taking care of you here today  Your workup today was reassuring.  I would make sure to follow back up with your primary care provider  Return for any worsening symptoms  I have started you on 2 medications to help  Bentyl -this medication for abdominal pain Zofran -indication for nausea vomiting

## 2023-08-31 ENCOUNTER — Other Ambulatory Visit: Payer: Self-pay | Admitting: Family Medicine

## 2023-09-06 ENCOUNTER — Inpatient Hospital Stay (HOSPITAL_COMMUNITY)
Admission: EM | Admit: 2023-09-06 | Discharge: 2023-09-11 | DRG: 638 | Disposition: A | Discharge status: Skilled Nursing Facility | Attending: Internal Medicine | Admitting: Internal Medicine

## 2023-09-06 ENCOUNTER — Emergency Department (HOSPITAL_COMMUNITY)

## 2023-09-06 ENCOUNTER — Other Ambulatory Visit: Payer: Self-pay

## 2023-09-06 DIAGNOSIS — Z86718 Personal history of other venous thrombosis and embolism: Secondary | ICD-10-CM

## 2023-09-06 DIAGNOSIS — I5032 Chronic diastolic (congestive) heart failure: Secondary | ICD-10-CM | POA: Diagnosis present

## 2023-09-06 DIAGNOSIS — Z808 Family history of malignant neoplasm of other organs or systems: Secondary | ICD-10-CM

## 2023-09-06 DIAGNOSIS — Z85850 Personal history of malignant neoplasm of thyroid: Secondary | ICD-10-CM

## 2023-09-06 DIAGNOSIS — Z888 Allergy status to other drugs, medicaments and biological substances status: Secondary | ICD-10-CM

## 2023-09-06 DIAGNOSIS — M793 Panniculitis, unspecified: Secondary | ICD-10-CM | POA: Diagnosis present

## 2023-09-06 DIAGNOSIS — Z79890 Hormone replacement therapy (postmenopausal): Secondary | ICD-10-CM

## 2023-09-06 DIAGNOSIS — F319 Bipolar disorder, unspecified: Secondary | ICD-10-CM | POA: Diagnosis present

## 2023-09-06 DIAGNOSIS — R197 Diarrhea, unspecified: Principal | ICD-10-CM

## 2023-09-06 DIAGNOSIS — D72829 Elevated white blood cell count, unspecified: Secondary | ICD-10-CM | POA: Diagnosis present

## 2023-09-06 DIAGNOSIS — Z79899 Other long term (current) drug therapy: Secondary | ICD-10-CM

## 2023-09-06 DIAGNOSIS — E11628 Type 2 diabetes mellitus with other skin complications: Principal | ICD-10-CM | POA: Diagnosis present

## 2023-09-06 DIAGNOSIS — G9349 Other encephalopathy: Secondary | ICD-10-CM | POA: Diagnosis present

## 2023-09-06 DIAGNOSIS — Z6841 Body Mass Index (BMI) 40.0 and over, adult: Secondary | ICD-10-CM

## 2023-09-06 DIAGNOSIS — L0390 Cellulitis, unspecified: Secondary | ICD-10-CM | POA: Diagnosis present

## 2023-09-06 DIAGNOSIS — Z7952 Long term (current) use of systemic steroids: Secondary | ICD-10-CM

## 2023-09-06 DIAGNOSIS — Z7982 Long term (current) use of aspirin: Secondary | ICD-10-CM

## 2023-09-06 DIAGNOSIS — J4489 Other specified chronic obstructive pulmonary disease: Secondary | ICD-10-CM | POA: Diagnosis present

## 2023-09-06 DIAGNOSIS — K219 Gastro-esophageal reflux disease without esophagitis: Secondary | ICD-10-CM | POA: Diagnosis present

## 2023-09-06 DIAGNOSIS — Z5971 Insufficient health insurance coverage: Secondary | ICD-10-CM

## 2023-09-06 DIAGNOSIS — Z90710 Acquired absence of both cervix and uterus: Secondary | ICD-10-CM

## 2023-09-06 DIAGNOSIS — Z7951 Long term (current) use of inhaled steroids: Secondary | ICD-10-CM

## 2023-09-06 DIAGNOSIS — I10 Essential (primary) hypertension: Secondary | ICD-10-CM | POA: Diagnosis present

## 2023-09-06 DIAGNOSIS — E1169 Type 2 diabetes mellitus with other specified complication: Secondary | ICD-10-CM

## 2023-09-06 DIAGNOSIS — I110 Hypertensive heart disease with heart failure: Secondary | ICD-10-CM | POA: Diagnosis present

## 2023-09-06 DIAGNOSIS — Z87891 Personal history of nicotine dependence: Secondary | ICD-10-CM

## 2023-09-06 DIAGNOSIS — G9340 Encephalopathy, unspecified: Secondary | ICD-10-CM | POA: Diagnosis present

## 2023-09-06 DIAGNOSIS — I472 Ventricular tachycardia, unspecified: Secondary | ICD-10-CM | POA: Diagnosis not present

## 2023-09-06 DIAGNOSIS — E785 Hyperlipidemia, unspecified: Secondary | ICD-10-CM | POA: Diagnosis present

## 2023-09-06 DIAGNOSIS — N390 Urinary tract infection, site not specified: Secondary | ICD-10-CM

## 2023-09-06 DIAGNOSIS — Z91030 Bee allergy status: Secondary | ICD-10-CM

## 2023-09-06 DIAGNOSIS — M109 Gout, unspecified: Secondary | ICD-10-CM | POA: Diagnosis present

## 2023-09-06 DIAGNOSIS — Z833 Family history of diabetes mellitus: Secondary | ICD-10-CM

## 2023-09-06 DIAGNOSIS — G4733 Obstructive sleep apnea (adult) (pediatric): Secondary | ICD-10-CM | POA: Diagnosis present

## 2023-09-06 DIAGNOSIS — Z7985 Long-term (current) use of injectable non-insulin antidiabetic drugs: Secondary | ICD-10-CM

## 2023-09-06 DIAGNOSIS — E119 Type 2 diabetes mellitus without complications: Secondary | ICD-10-CM

## 2023-09-06 DIAGNOSIS — R627 Adult failure to thrive: Secondary | ICD-10-CM | POA: Diagnosis present

## 2023-09-06 DIAGNOSIS — L03311 Cellulitis of abdominal wall: Secondary | ICD-10-CM | POA: Diagnosis present

## 2023-09-06 DIAGNOSIS — Z8709 Personal history of other diseases of the respiratory system: Secondary | ICD-10-CM

## 2023-09-06 DIAGNOSIS — J9611 Chronic respiratory failure with hypoxia: Secondary | ICD-10-CM | POA: Diagnosis present

## 2023-09-06 DIAGNOSIS — F419 Anxiety disorder, unspecified: Secondary | ICD-10-CM | POA: Diagnosis present

## 2023-09-06 DIAGNOSIS — E662 Morbid (severe) obesity with alveolar hypoventilation: Secondary | ICD-10-CM | POA: Diagnosis present

## 2023-09-06 DIAGNOSIS — Z886 Allergy status to analgesic agent status: Secondary | ICD-10-CM

## 2023-09-06 LAB — CBC WITH DIFFERENTIAL/PLATELET
Abs Immature Granulocytes: 0.13 10*3/uL — ABNORMAL HIGH (ref 0.00–0.07)
Basophils Absolute: 0.1 10*3/uL (ref 0.0–0.1)
Basophils Relative: 0 %
Eosinophils Absolute: 0.3 10*3/uL (ref 0.0–0.5)
Eosinophils Relative: 2 %
HCT: 37.2 % (ref 36.0–46.0)
Hemoglobin: 10.9 g/dL — ABNORMAL LOW (ref 12.0–15.0)
Immature Granulocytes: 1 %
Lymphocytes Relative: 12 %
Lymphs Abs: 1.9 10*3/uL (ref 0.7–4.0)
MCH: 30.1 pg (ref 26.0–34.0)
MCHC: 29.3 g/dL — ABNORMAL LOW (ref 30.0–36.0)
MCV: 102.8 fL — ABNORMAL HIGH (ref 80.0–100.0)
Monocytes Absolute: 0.9 10*3/uL (ref 0.1–1.0)
Monocytes Relative: 5 %
Neutro Abs: 12.7 10*3/uL — ABNORMAL HIGH (ref 1.7–7.7)
Neutrophils Relative %: 80 %
Platelets: 264 10*3/uL (ref 150–400)
RBC: 3.62 MIL/uL — ABNORMAL LOW (ref 3.87–5.11)
RDW: 13.1 % (ref 11.5–15.5)
WBC: 15.8 10*3/uL — ABNORMAL HIGH (ref 4.0–10.5)
nRBC: 0 % (ref 0.0–0.2)

## 2023-09-06 LAB — COMPREHENSIVE METABOLIC PANEL WITH GFR
ALT: 36 U/L (ref 0–44)
AST: 33 U/L (ref 15–41)
Albumin: 3.5 g/dL (ref 3.5–5.0)
Alkaline Phosphatase: 49 U/L (ref 38–126)
Anion gap: 10 (ref 5–15)
BUN: 16 mg/dL (ref 8–23)
CO2: 34 mmol/L — ABNORMAL HIGH (ref 22–32)
Calcium: 8.8 mg/dL — ABNORMAL LOW (ref 8.9–10.3)
Chloride: 103 mmol/L (ref 98–111)
Creatinine, Ser: 0.94 mg/dL (ref 0.44–1.00)
GFR, Estimated: >60 mL/min (ref >60)
Glucose, Bld: 108 mg/dL — ABNORMAL HIGH (ref 70–99)
Potassium: 4.7 mmol/L (ref 3.5–5.1)
Sodium: 147 mmol/L — ABNORMAL HIGH (ref 135–145)
Total Bilirubin: 0.9 mg/dL (ref 0.0–1.2)
Total Protein: 6.6 g/dL (ref 6.5–8.1)

## 2023-09-06 LAB — BRAIN NATRIURETIC PEPTIDE: B Natriuretic Peptide: 35 pg/mL (ref 0.0–100.0)

## 2023-09-06 MED ORDER — MORPHINE SULFATE (PF) 4 MG/ML IV SOLN
4.0000 mg | Freq: Once | INTRAVENOUS | Status: AC
  Administered 2023-09-06: 4 mg via INTRAVENOUS
  Filled 2023-09-06: qty 1

## 2023-09-06 MED ORDER — IOHEXOL 350 MG/ML SOLN
100.0000 mL | Freq: Once | INTRAVENOUS | Status: AC | PRN
  Administered 2023-09-06: 100 mL via INTRAVENOUS

## 2023-09-06 NOTE — ED Provider Notes (Signed)
 [place] EMERGENCY DEPARTMENT AT [facility] Provider Note  CSN: [id number] Arrival date & time: 09/06/23 1430  Chief Complaint(s) Failure To Thrive  HPI Michele Owens is a 63 y.o. female with PMH CHF, COPD, GERD, HTN presenting to the ER with diarrhea. Patient reports diarrhea for 1-2 weeks. Patient reports no bloody stool or melena. No nausea, vomiting. Reports some mild diffuse abdominal pain. Also having some leg swelling. Reports generalized weakness. EMS reports patient was not able to walk.  Patient reports she lives alone.  Patient reports that if everything is okay on the testing she would prefer to go home.   Past Medical History:  Diagnosis Date   Anxiety    Arthritis    Asthma    Chronic diastolic CHF (congestive heart failure) (HCC)    COPD (chronic obstructive pulmonary disease) (HCC)    Uses Oxygen  at night   Depression    Diabetes mellitus without complication (HCC)    GERD (gastroesophageal reflux disease)    Gout    Headache    migraines   History of DVT of lower extremity    Hypertension    Morbid obesity with BMI of 45.0-49.9, adult (HCC)    Thyroid  cancer (HCC) 09/23/2016   Patient Active Problem List   Diagnosis Date Noted   History of COPD 06/06/2023   Bipolar disorder (HCC) 06/06/2023   At risk for polypharmacy 02/07/2023   Oxygen  dependent 02/07/2023   Acute respiratory failure with hypercapnia (HCC) 12/14/2022   Delirium due to multiple etiologies, acute, hyperactive 12/14/2022   Acute respiratory failure with hypoxia and hypercapnia (HCC) 10/25/2022   Acute on chronic diastolic CHF (congestive heart failure) (HCC) 10/23/2022   Current mild episode of major depressive disorder without prior episode (HCC) 06/06/2022   Fever 06/04/2022   Acute on chronic diastolic (congestive) heart failure (HCC) 05/16/2022   Acute kidney injury superimposed on chronic kidney disease (HCC) 02/01/2022   Hypothyroidism 02/01/2022   Chronic pain  disorder 02/01/2022   Respiratory failure with hypercapnia (HCC) 12/23/2020   Anemia 12/23/2020   Diabetic peripheral neuropathy (HCC) 08/18/2020   Acute on chronic respiratory failure with hypoxia (HCC) 04/18/2020   Hemoptysis 04/18/2020   Acute encephalopathy 04/18/2020   Chronic diastolic CHF (congestive heart failure) (HCC) 04/18/2020   Sepsis (HCC) 04/18/2020   Severe obesity (BMI >= 40) (HCC)    Fall at home, initial encounter 06/23/2018   HNP (herniated nucleus pulposus), thoracic 06/23/2018   CHF (congestive heart failure) (HCC) 06/18/2018   Essential hypertension 08/28/2017   Obesity 08/28/2017   COPD exacerbation (HCC) 08/04/2017   Acute and chronic respiratory failure with hypercapnia (HCC)    Pulmonary HTN (HCC)    Genetic testing 02/27/2017   Family history of thyroid  cancer    Hypomagnesemia 10/03/2016   Atypical chest pain    HLD (hyperlipidemia) 09/23/2016   COPD (chronic obstructive pulmonary disease) (HCC) 09/23/2016   Gout 09/23/2016   DVT (deep vein thrombosis) in pregnancy 09/23/2016   Thyroid  cancer (HCC) 09/23/2016   Hypocalcemia 09/23/2016   Acute pulmonary edema (HCC)    Acute hypoxemic respiratory failure (HCC)    Chronic hypoxic respiratory failure (HCC) 07/16/2016   CAP (community acquired pneumonia) 01/23/2016   Multinodular goiter w/ dominant right thyroid  nodule 01/23/2016   Pulmonary hypertension (HCC) 01/23/2016   Obesity hypoventilation syndrome (HCC) 08/26/2015   Chest pain 06/16/2015   Non-insulin  dependent type 2 diabetes mellitus (HCC) 04/24/2015   Controlled type 2 diabetes mellitus without complication, without long-term  current use of insulin  (HCC) 04/24/2015   Depression with anxiety 04/24/2015   Benign essential HTN 04/24/2015   Normocytic anemia 04/24/2015   Anxiety 04/24/2015   OSA (obstructive sleep apnea) 05/10/2012   Tobacco abuse 07/04/2011   Home Medication(s) Prior to Admission medications   Medication Sig Start Date  End Date Taking? Authorizing Provider  ACETAMINOPHEN  PO Take 1,300-2,050 mg by mouth daily as needed for moderate pain (pain score 4-6) or headache.    [provider]  albuterol  (VENTOLIN  HFA) 108 (90 Base) MCG/ACT inhaler Inhale 1-2 puffs into the lungs every 6 (six) hours as needed for wheezing or shortness of breath. 11/24/22   Darlis Eisenmenger, MD  allopurinol  (ZYLOPRIM ) 100 MG tablet Take 100 mg by mouth daily. Patient not taking: Reported on 06/06/2023    [provider]  aspirin  EC 81 MG tablet Take 81 mg by mouth daily. Swallow whole.    [provider]  blood glucose meter kit and supplies KIT Dispense based on patient and insurance preference. Use up to four times daily as directed. 12/25/20   Haydee Lipa, MD  budesonide  (PULMICORT ) 0.25 MG/2ML nebulizer solution Inhale 2 mLs (0.25 mg total) by nebulization 2 (two) times daily. Patient taking differently: Take 0.25 mg by nebulization daily as needed (for shortness of breath or wheezing). 11/24/22   Darlis Eisenmenger, MD  busPIRone  (BUSPAR ) 5 MG tablet Take 1 tablet (5 mg total) by mouth 3 (three) times daily. Patient taking differently: Take 10 mg by mouth 3 (three) times daily. 02/07/23   Henson, Vickie L, NP-C  butalbital -acetaminophen -caffeine  (FIORICET ) 50-325-40 MG tablet Take 1-2 tablets by mouth every 6 (six) hours as needed for headache. 05/08/23 05/07/24  Denese Finn, PA-C  calcitRIOL  (ROCALTROL ) 0.5 MCG capsule Take 1 capsule (0.5 mcg total) by mouth daily. Patient not taking: Reported on 06/07/2023 09/26/16   Hongalgi, Anand D, MD  carvedilol  (COREG ) 6.25 MG tablet Take 1 tablet (6.25 mg total) by mouth 2 (two) times daily. 05/23/23   Milford, Arlice Bene, FNP  dicyclomine  (BENTYL ) 20 MG tablet Take 1 tablet (20 mg total) by mouth 2 (two) times daily. 08/29/23   Henderly, Britni A, PA-C  doxycycline  (VIBRAMYCIN ) 100 MG capsule Take 1 capsule (100 mg total) by mouth 2 (two) times daily. 06/11/23    Deatra Face, MD  EPINEPHrine  0.3 mg/0.3 mL IJ SOAJ injection Inject 0.3 mg into the muscle once as needed for anaphylaxis.    [provider]  FARXIGA  10 MG TABS tablet Take 1 tablet (10 mg total) by mouth daily before breakfast. 02/02/23   Darlis Eisenmenger, MD  fenofibrate  (TRICOR ) 145 MG tablet Take 1 tablet (145 mg total) by mouth daily. 02/02/23   Darlis Eisenmenger, MD  FEROSUL 325 (65 Fe) MG tablet Take 1 tablet (325 mg total) by mouth daily. 02/09/23   Henson, Vickie L, NP-C  levocetirizine (XYZAL ) 5 MG tablet Take 1 tablet (5 mg total) by mouth at bedtime. 11/24/22   Darlis Eisenmenger, MD  levothyroxine  (SYNTHROID ) 125 MCG tablet Take 125 mcg by mouth at bedtime. 05/07/23   [provider]  losartan  (COZAAR ) 25 MG tablet Take 1 tablet (25 mg total) by mouth daily. 02/02/23   Darlis Eisenmenger, MD  nitroGLYCERIN  (NITROSTAT ) 0.4 MG SL tablet Place 1 tablet (0.4 mg total) under the tongue every 5 (five) minutes as needed for chest pain. 05/23/23   Elmarie Hacking, FNP  nystatin  cream (MYCOSTATIN ) Apply 1 Application topically 2 (  two) times daily as needed for dry skin. 09/26/22   [provider]  omeprazole  (PRILOSEC) 20 MG capsule Take 1 capsule (20 mg total) by mouth 2 (two) times daily as needed (acid reflux). Patient taking differently: Take 40 mg by mouth daily. 11/24/22   Darlis Eisenmenger, MD  ondansetron  (ZOFRAN ) 4 MG tablet Take 1 tablet (4 mg total) by mouth every 8 (eight) hours as needed for nausea or vomiting. 06/19/23   Carleton Cheek, PA-C  ondansetron  (ZOFRAN -ODT) 4 MG disintegrating tablet Take 1 tablet (4 mg total) by mouth every 8 (eight) hours as needed. 08/29/23   Henderly, Britni A, PA-C  OXYGEN  Inhale 2 L into the lungs continuous.    [provider]  OZEMPIC, 0.25 OR 0.5 MG/DOSE, 2 MG/3ML SOPN Inject 0.5 mg into the skin every Friday. 05/17/23   [provider]  polyethylene glycol (MIRALAX  / GLYCOLAX ) 17 g packet Take 17 g by mouth  daily as needed for mild constipation. Patient not taking: Reported on 02/02/2023 11/24/22   Darlis Eisenmenger, MD  potassium chloride  (KLOR-CON ) 10 MEQ tablet Take 1 tablet (10 mEq total) by mouth 2 (two) times daily. 07/17/23   Darlis Eisenmenger, MD  predniSONE  (DELTASONE ) 10 MG tablet Take 4 tablets (40 mg total) by mouth daily. 06/11/23   Nanavati, Ankit, MD  rosuvastatin  (CRESTOR ) 10 MG tablet Take 1 tablet (10 mg total) by mouth every evening. 02/02/23   Darlis Eisenmenger, MD  senna-docusate (SENOKOT-S) 8.6-50 MG tablet Take 1 tablet by mouth daily. Patient not taking: Reported on 06/07/2023 02/07/23   Abram Abraham, NP-C  spironolactone  (ALDACTONE ) 25 MG tablet Take 1 tablet (25 mg total) by mouth daily. 02/02/23   Darlis Eisenmenger, MD  torsemide  (DEMADEX ) 20 MG tablet Take 2 tablets (40 mg total) by mouth 2 (two) times daily. 11/24/22   Darlis Eisenmenger, MD  traZODone  (DESYREL ) 50 MG tablet Take 2 tablets (100 mg total) by mouth at bedtime. Patient not taking: Reported on 06/07/2023 02/07/23   Alyson Back L, NP-C  umeclidinium-vilanterol (ANORO ELLIPTA ) 62.5-25 MCG/ACT AEPB Inhale 1 puff into the lungs daily. 02/09/23   Henson, Vickie L, NP-C  Vitamin D, Ergocalciferol, (DRISDOL) 1.25 MG (50000 UNIT) CAPS capsule Take 50,000 Units by mouth once a week. 06/05/23   [provider]  zolpidem  (AMBIEN ) 10 MG tablet Take 10 mg by mouth at bedtime.    [provider]                                                                                                                                    Past Surgical History Past Surgical History:  Procedure Laterality Date   BUNIONECTOMY Bilateral    CARDIAC CATHETERIZATION N/A 06/23/2015   Procedure: Right/Left Heart Cath and Coronary Angiography;  Surgeon: Darlis Eisenmenger, MD;  [facility] INVASIVE CV LAB;  Service: Cardiovascular;  Laterality: N/A;   COLONOSCOPY  WITH PROPOFOL  N/A 12/15/2015   Procedure: COLONOSCOPY WITH PROPOFOL ;   Surgeon: Delilah Fend, MD;  Location[facility] ENDOSCOPY;  Service: Endoscopy;  Laterality: N/A;   RIGHT HEART CATH N/A 10/01/2019   Procedure: RIGHT HEART CATH;  Surgeon: Darlis Eisenmenger, MD;  Location: [facility] INVASIVE CV LAB;  Service: Cardiovascular;  Laterality: N/A;   THYROIDECTOMY  09/19/2016   THYROIDECTOMY N/A 09/19/2016   Procedure: TOTAL THYROIDECTOMY;  Surgeon: Oralee Billow, MD;  Location: [facility] OR;  Service: General;  Laterality: N/A;   TONSILLECTOMY     TOTAL ABDOMINAL HYSTERECTOMY  07/14/10   Family History Family History  Problem Relation Age of Onset   Cancer Father        thought to be due to exposure to concrete   Diabetes Mother    Thyroid  cancer Mother        dx in her 32s-60s   Cancer Maternal Uncle        2 uncles with cancer NOS   Brain cancer Paternal Aunt    Cancer Cousin        maternal first cousin - NOS   Cancer Cousin        maternal first cousin - NOS    Social History Social History   Tobacco Use   Smoking status: Former    Current packs/day: 0.00    Average packs/day: 0.5 packs/day for 41.0 years (20.5 ttl pk-yrs)    Types: Cigarettes    Start date: 07/1975    Quit date: 07/2016    Years since quitting: 7.1   Smokeless tobacco: Never  Vaping Use   Vaping status: Never Used  Substance Use Topics   Alcohol use: Not Currently    Comment: quit 12 years ago   Drug use: Not Currently    Types: Marijuana, Cocaine    Comment: quit 12 years ago   Allergies Bee venom, Fioricet  [butalbital -apap-caffeine ], Ibuprofen , Lamisil  [terbinafine ], and Nsaids  Review of Systems Review of Systems  All other systems reviewed and are negative.   Physical Exam Vital Signs  I have reviewed the triage vital signs BP (!) 123/49   Pulse 65   Temp 98.1 F (36.7 C) (Oral)   Resp (!) 27   SpO2 100%  Physical Exam Vitals and nursing note reviewed.  Constitutional:      General: She is not in acute distress.    Appearance: She is well-developed.  HENT:     Head:  Normocephalic and atraumatic.     Mouth/Throat:     Mouth: Mucous membranes are moist.  Eyes:     Pupils: Pupils are equal, round, and reactive to light.  Cardiovascular:     Rate and Rhythm: Normal rate and regular rhythm.     Heart sounds: No murmur heard. Pulmonary:     Effort: Pulmonary effort is normal. No respiratory distress.     Breath sounds: Normal breath sounds.  Abdominal:     General: Abdomen is flat.     Palpations: Abdomen is soft.     Tenderness: There is abdominal tenderness (mild diffuse).  Musculoskeletal:        General: No tenderness.     Right lower leg: Edema present.     Left lower leg: Edema present.  Skin:    General: Skin is warm and dry.  Neurological:     General: No focal deficit present.     Mental Status: She is alert. Mental status is at baseline.  Psychiatric:  Mood and Affect: Mood normal.        Behavior: Behavior normal.     ED Results and Treatments Labs (all labs ordered are listed, but only abnormal results are displayed) Labs Reviewed  C DIFFICILE QUICK SCREEN W PCR REFLEX    GASTROINTESTINAL PANEL BY PCR, STOOL (REPLACES STOOL CULTURE)  COMPREHENSIVE METABOLIC PANEL WITH GFR  CBC WITH DIFFERENTIAL/PLATELET  BRAIN NATRIURETIC PEPTIDE  URINALYSIS, W/ REFLEX TO CULTURE (INFECTION SUSPECTED)                                                                                                                          Radiology No results found.  Pertinent labs & imaging results that were available during my care of the patient were reviewed by me and considered in my medical decision making (see MDM for details).  Medications Ordered in ED Medications - No data to display                                                                                                                                   Procedures Procedures  (including critical care time)  Medical Decision Making / ED Course   MDM:  63 year old presenting  to the emergency department with diarrhea, weakness.  Patient well-appearing, physical examination with mild diffuse abdominal tenderness, is not appear dehydrated.  Has some lower extremity edema.  Lungs are clear.  Given persistent diarrhea, will check some stool studies, including C. difficile and GI panel if she is able to provide stool sample.  She does have some tenderness we will also obtain a CT of the abdomen.  Will check labs to evaluate for dehydration although she appears euvolemic to slightly volume overloaded on exam, but lungs are clear and there is no hypoxia.  Patient did eat marijuana Gummies while in the room.  She reports that she takes these frequently.  This could be contributing to her weakness.  She is instructed not to take any more of these drugs in the ER.  Signed out to Dr. Carylon Claude pending labs/CT scan.       Additional history obtained: -Additional history obtained from ems -External records from outside source obtained and reviewed including: Chart review including previous notes, labs, imaging, consultation notes including prior notes    Lab Tests: -I ordered, reviewed, and interpreted labs.   The pertinent results include:   Labs Reviewed  C DIFFICILE  QUICK SCREEN W PCR REFLEX    GASTROINTESTINAL PANEL BY PCR, STOOL (REPLACES STOOL CULTURE)  COMPREHENSIVE METABOLIC PANEL WITH GFR  CBC WITH DIFFERENTIAL/PLATELET  BRAIN NATRIURETIC PEPTIDE  URINALYSIS, W/ REFLEX TO CULTURE (INFECTION SUSPECTED)    Notable for pending    Medicines ordered and prescription drug management: No orders of the defined types were placed in this encounter.   -I have reviewed the patients home medicines and have made adjustments as needed   Social Determinants of Health:  Diagnosis or treatment significantly limited by social determinants of health: obesity and lives alone    Co morbidities that complicate the patient evaluation  Past Medical History:  Diagnosis Date    Anxiety    Arthritis    Asthma    Chronic diastolic CHF (congestive heart failure) (HCC)    COPD (chronic obstructive pulmonary disease) (HCC)    Uses Oxygen  at night   Depression    Diabetes mellitus without complication (HCC)    GERD (gastroesophageal reflux disease)    Gout    Headache    migraines   History of DVT of lower extremity    Hypertension    Morbid obesity with BMI of 45.0-49.9, adult (HCC)    Thyroid  cancer (HCC) 09/23/2016      Dispostion: Disposition decision including need for hospitalization was considered, and patient disposition pending at time of sign out.    Final Clinical Impression(s) / ED Diagnoses Final diagnoses:  Diarrhea, unspecified type     This chart was dictated using voice recognition software.  Despite best efforts to proofread,  errors can occur which can change the documentation meaning.    Mordecai Applebaum, MD 09/06/23 [phone]

## 2023-09-06 NOTE — ED Triage Notes (Signed)
 BIB EMS  Failure to thrive, unable to care for herself from weakness, CHF, COPD, unable to get off couch and use walker. Pt reports she has been on the couch for several days. Could not ambulate with EMS. Bilateral lower extremity edema. Lives at home, EMS unsure if she lives alone, daughter unable to care for her, pt reports she has been urinating on couch and floor. Daughter coming to hospital later.   V/S  2lpm at baseline via [place]  136 72 Hr 70  O2 92% BGL 213

## 2023-09-06 NOTE — ED Notes (Signed)
 Phleb stuck, didn't get any blood. Pt refused to be stuck 2nd time

## 2023-09-06 NOTE — ED Provider Notes (Signed)
 Patient signed out to me by previous provider. Please refer to their note for full HPI.  Briefly this is a 63 year old female who has had ongoing diarrhea for the past 1 to 2 weeks.  Patient signed out pending lab evaluation, stool studies and CT of the abdomen pelvis.  Of note patient was eating marijuana Gummies, when despite nursing staff.  Patient declining to give stool samples at this time.  Blood work shows a mild anemia, leukocytosis which has been baseline.  Otherwise is reassuring.  CT of the abdomen pelvis shows no findings intra-abdominal.  Does mention thickening skin in the lower abdomen/pannus concerning for cellulitis.  On physical exam she does have some erythema along that area.  No significant induration, fluctuance and no areas of drainage.  Will put her on oral antibiotics for superficial cellulitis.  Had a discussion with the patient about her safety at home.  Initially she was wanting to be discharged however now she is admitting that she is ultimately sedentary on the couch without any help at home.  Unable to get up and even get to the restroom.  It was noted that EMS found her sitting on the couch soiled with feces.  She is having a hard time caring for self and does not have any aide at home.  For this reason we will pursue a PT evaluation and consult TOC/SW for further recommendations.  Patient stable at time of boarding.  Diet and home medications ordered.   Flonnie Humphrey, DO 09/06/23 2359

## 2023-09-06 NOTE — ED Notes (Signed)
 Michele Owens the Emergency room tech told me that patient took 2 THC gummies. Rn went speak to patient and told her she not suppose to take the gummies or another medication. Patient stated "Michele Owens is going take my bag.

## 2023-09-07 ENCOUNTER — Encounter (HOSPITAL_COMMUNITY): Payer: Self-pay

## 2023-09-07 LAB — RAPID URINE DRUG SCREEN, HOSP PERFORMED
Amphetamines: NOT DETECTED
Barbiturates: NOT DETECTED
Benzodiazepines: NOT DETECTED
Cocaine: NOT DETECTED
Opiates: NOT DETECTED
Tetrahydrocannabinol: POSITIVE — AB

## 2023-09-07 MED ORDER — LOSARTAN POTASSIUM 50 MG PO TABS
25.0000 mg | ORAL_TABLET | Freq: Every day | ORAL | Status: DC
  Administered 2023-09-07 – 2023-09-11 (×5): 25 mg via ORAL
  Filled 2023-09-07 (×5): qty 1

## 2023-09-07 MED ORDER — ROSUVASTATIN CALCIUM 5 MG PO TABS
10.0000 mg | ORAL_TABLET | Freq: Every evening | ORAL | Status: DC
  Administered 2023-09-07 – 2023-09-11 (×5): 10 mg via ORAL
  Filled 2023-09-07 (×5): qty 2

## 2023-09-07 MED ORDER — ALBUTEROL SULFATE (2.5 MG/3ML) 0.083% IN NEBU
2.5000 mg | INHALATION_SOLUTION | Freq: Four times a day (QID) | RESPIRATORY_TRACT | Status: DC | PRN
  Filled 2023-09-07: qty 3

## 2023-09-07 MED ORDER — DAPAGLIFLOZIN PROPANEDIOL 10 MG PO TABS
10.0000 mg | ORAL_TABLET | Freq: Every day | ORAL | Status: DC
  Administered 2023-09-07 – 2023-09-11 (×5): 10 mg via ORAL
  Filled 2023-09-07 (×6): qty 1

## 2023-09-07 MED ORDER — BUSPIRONE HCL 5 MG PO TABS
5.0000 mg | ORAL_TABLET | Freq: Three times a day (TID) | ORAL | Status: DC
  Administered 2023-09-07 – 2023-09-11 (×14): 5 mg via ORAL
  Filled 2023-09-07 (×14): qty 1

## 2023-09-07 MED ORDER — ONDANSETRON HCL 4 MG PO TABS
4.0000 mg | ORAL_TABLET | Freq: Three times a day (TID) | ORAL | Status: DC | PRN
  Filled 2023-09-07: qty 1

## 2023-09-07 MED ORDER — ZOLPIDEM TARTRATE 5 MG PO TABS
10.0000 mg | ORAL_TABLET | Freq: Every day | ORAL | Status: DC
  Administered 2023-09-07 – 2023-09-10 (×5): 10 mg via ORAL
  Filled 2023-09-07 (×5): qty 2

## 2023-09-07 MED ORDER — PREDNISONE 20 MG PO TABS
40.0000 mg | ORAL_TABLET | Freq: Every day | ORAL | Status: DC
  Administered 2023-09-07 – 2023-09-11 (×5): 40 mg via ORAL
  Filled 2023-09-07 (×5): qty 2

## 2023-09-07 MED ORDER — TORSEMIDE 20 MG PO TABS
40.0000 mg | ORAL_TABLET | Freq: Two times a day (BID) | ORAL | Status: DC
  Administered 2023-09-07 – 2023-09-11 (×8): 40 mg via ORAL
  Filled 2023-09-07 (×9): qty 2

## 2023-09-07 MED ORDER — UMECLIDINIUM-VILANTEROL 62.5-25 MCG/ACT IN AEPB
1.0000 | INHALATION_SPRAY | Freq: Every day | RESPIRATORY_TRACT | Status: DC
  Administered 2023-09-07 – 2023-09-11 (×5): 1 via RESPIRATORY_TRACT
  Filled 2023-09-07: qty 14

## 2023-09-07 MED ORDER — CARVEDILOL 6.25 MG PO TABS
6.2500 mg | ORAL_TABLET | Freq: Two times a day (BID) | ORAL | Status: DC
  Administered 2023-09-07 – 2023-09-11 (×10): 6.25 mg via ORAL
  Filled 2023-09-07: qty 1
  Filled 2023-09-07: qty 2
  Filled 2023-09-07: qty 1
  Filled 2023-09-07: qty 2
  Filled 2023-09-07 (×2): qty 1
  Filled 2023-09-07 (×2): qty 2
  Filled 2023-09-07: qty 1
  Filled 2023-09-07: qty 2

## 2023-09-07 MED ORDER — LEVOTHYROXINE SODIUM 25 MCG PO TABS
125.0000 ug | ORAL_TABLET | Freq: Every day | ORAL | Status: DC
  Administered 2023-09-07 – 2023-09-11 (×6): 125 ug via ORAL
  Filled 2023-09-07 (×7): qty 1

## 2023-09-07 MED ORDER — POTASSIUM CHLORIDE CRYS ER 10 MEQ PO TBCR
10.0000 meq | EXTENDED_RELEASE_TABLET | Freq: Two times a day (BID) | ORAL | Status: DC
  Administered 2023-09-07 – 2023-09-11 (×11): 10 meq via ORAL
  Filled 2023-09-07 (×11): qty 1

## 2023-09-07 MED ORDER — DICYCLOMINE HCL 20 MG PO TABS
20.0000 mg | ORAL_TABLET | Freq: Two times a day (BID) | ORAL | Status: DC
  Administered 2023-09-07 – 2023-09-11 (×11): 20 mg via ORAL
  Filled 2023-09-07 (×11): qty 1

## 2023-09-07 MED ORDER — LORATADINE 10 MG PO TABS
10.0000 mg | ORAL_TABLET | Freq: Every day | ORAL | Status: DC
  Administered 2023-09-07 – 2023-09-11 (×6): 10 mg via ORAL
  Filled 2023-09-07 (×7): qty 1

## 2023-09-07 MED ORDER — BUDESONIDE 0.25 MG/2ML IN SUSP
0.2500 mg | Freq: Two times a day (BID) | RESPIRATORY_TRACT | Status: DC
  Administered 2023-09-07 – 2023-09-08 (×3): 0.25 mg via RESPIRATORY_TRACT
  Filled 2023-09-07 (×3): qty 2

## 2023-09-07 MED ORDER — PANTOPRAZOLE SODIUM 40 MG PO TBEC
40.0000 mg | DELAYED_RELEASE_TABLET | Freq: Every day | ORAL | Status: DC
  Administered 2023-09-07 – 2023-09-11 (×5): 40 mg via ORAL
  Filled 2023-09-07 (×5): qty 1

## 2023-09-07 MED ORDER — POLYETHYLENE GLYCOL 3350 17 G PO PACK: 17.0000 g | PACK | Freq: Every day | ORAL | Status: DC | PRN

## 2023-09-07 MED ORDER — TRAZODONE HCL 50 MG PO TABS
100.0000 mg | ORAL_TABLET | Freq: Every day | ORAL | Status: DC
  Administered 2023-09-07 – 2023-09-10 (×5): 100 mg via ORAL
  Filled 2023-09-07 (×5): qty 2

## 2023-09-07 MED ORDER — SENNOSIDES-DOCUSATE SODIUM 8.6-50 MG PO TABS
1.0000 | ORAL_TABLET | Freq: Every day | ORAL | Status: DC
  Administered 2023-09-07 – 2023-09-11 (×5): 1 via ORAL
  Filled 2023-09-07 (×5): qty 1

## 2023-09-07 MED ORDER — SPIRONOLACTONE 25 MG PO TABS
25.0000 mg | ORAL_TABLET | Freq: Every day | ORAL | Status: DC
  Administered 2023-09-07 – 2023-09-11 (×5): 25 mg via ORAL
  Filled 2023-09-07 (×5): qty 1

## 2023-09-07 MED ORDER — NYSTATIN 100000 UNIT/GM EX CREA
1.0000 | TOPICAL_CREAM | Freq: Two times a day (BID) | CUTANEOUS | Status: DC | PRN
  Administered 2023-09-10: 1 via TOPICAL
  Filled 2023-09-07 (×2): qty 30

## 2023-09-07 MED ORDER — ASPIRIN 81 MG PO TBEC
81.0000 mg | DELAYED_RELEASE_TABLET | Freq: Every day | ORAL | Status: DC
  Administered 2023-09-07 – 2023-09-11 (×5): 81 mg via ORAL
  Filled 2023-09-07 (×5): qty 1

## 2023-09-07 MED ORDER — ALBUTEROL SULFATE HFA 108 (90 BASE) MCG/ACT IN AERS: 1.0000 | INHALATION_SPRAY | Freq: Four times a day (QID) | RESPIRATORY_TRACT | Status: DC | PRN

## 2023-09-07 NOTE — Progress Notes (Addendum)
 2:05pm: CSW completed FL2 and faxed patient's clinicals out for review to obtain bed offers. Once bed offers are available, they will be presented to patient so a choice can be made.  1:30pm: CSW received message from PT stating the recommendation is SNF.  8:28am: CSW received consult for possible SNF placement.  PT is signed in and will evaluate patient to recommended the appropriate level of care.  Per MD note last night, patient was eating marijuana gummies in the ED. This will likely be a barrier to obtaining bed offers for SNF.  TOC will continue to follow for discharge planning purposes.  Shepard Dicker, MSW, LCSW Transitions of Care  Clinical Social Worker II [phone]

## 2023-09-07 NOTE — ED Notes (Signed)
 Pt calling out stating she hasn't been fed. Meal is sitting in front of her timed for this evening. Reminded pt she had received meal and that it was still in front of her. Pt is now stating she didn't get breakfast this morning and she wants it. Advised pt we were unable to back time meals, pt now asking for snacks. Has been asleep since this RN arrived on unit at 7pm.

## 2023-09-07 NOTE — ED Notes (Signed)
 Complete linen and brief changed at this time. Pericare performed also.

## 2023-09-07 NOTE — ED Notes (Signed)
 Report given to [facility] in yellow.

## 2023-09-07 NOTE — TOC CM/SW Note (Signed)
 SW attempted to reach patient's daughter Jolynda Townley ([phone]) to discuss placement barriers. SW left a voicemail. Patient is being considered for [facility] for SNF, SW attempted to reach liaison [facility] [phone]) and left a voicemail.   SW will continue to follow patient and accessed.  .Kenya Shiraishi, MSW, LCSWA Transition of Care  Clinical Social Worker (ED 3-11 Mon-Fri)  [phone]

## 2023-09-07 NOTE — ED Notes (Signed)
 While administering nighttime meds, I explained which meds I was administering. Pt is insisting that this RN has given her a fluid pill which I have not. I read the list of what I gave her and also advised her that the last time she had a fluid pill was at 5:30, Pt responded with "I told you you gave me a fluid pill, I know what my pills look like". Pt educated that pills may look different than her home meds as we use different manufacturers for meds. Pt still insistent that this RN gave her a fluid pill even after I explained to her that I didn't even start my shift until 7pm, pt still insistent that this RN is lying to her and is also asking for more food.

## 2023-09-07 NOTE — ED Notes (Signed)
 Pt repeatedly asking for more food and does not appear to remember that she has already had dinner and several snacks. Educated pt on importance of her being active in her care and that includes being mindful of calorie intake as she is diabetic. Pt was provided a snack and beverage 15 minutes ago and she is still requesting more food and the number to the cafeteria. Advised pt that the cafeteria is closed at this time, however the pt does not believe that it is closed and she wants to call down there herself.

## 2023-09-07 NOTE — Discharge Planning (Signed)
 RNCM met with pt at bedside regarding home health services.  RNCM asked if she has talked with her social workers to apply for an in-home aid, she said that she has not, but she will.  Pt states she has DME equipment at home including rolling walker that she uses when she needs too.  She states that she is just to weak to get up sometime.  RNCM suggested that she follow up with her DSS social worker sooner than later regarding the nursing aid, she states that she will.  Maddox Bratcher J. Rachel Budds, BSN, RN, NCM  Transitions of Care  Nurse Case Manager  [facility] Emergency Departments  Operative Services  [phone]

## 2023-09-07 NOTE — ED Notes (Signed)
 Went and spoke with family due to complaint.  Family wanted to know why I took so long for patient to be cleaned up and explained we did that for the safety of the patient and staff to not injury either party with pulling and tugging.  Explained patient refused purewick earlier as we tried to do that for her frequent urination.  Daughter verbalized her mother can be mouthy at times and doesn't want nurses being mean or snappy with her and to be patient.  Assured her the staff would not be that way.  Family appears to understand and verbalizes understanding of the care we will be providing.

## 2023-09-07 NOTE — ED Notes (Signed)
 Patient a/o no acute distress noted vss cardiac monitoring in progress sp02 98% on 2L [place] Patient has urinated bed several time throughout shift, patient cleaned linens changed. Patient educated on using call bell. Verbalized understanding. Patient does not use call bell to make needs known. Patient checked frequently for soiled sheets. Patient provided with snack bag and drink during shift. Siderails up x2 frequent checks in place call bell within reach safety and comfort maintained. Will monitor.

## 2023-09-07 NOTE — Evaluation (Signed)
 Physical Therapy Evaluation Patient Details Name: Michele Owens MRN: [id number] DOB: [birth year] Today's Date: 09/07/2023  History of Present Illness  Patient is a 63 y/o female seen in ED 09/06/23 due to diarrhea x 1-2 weeks, generalized weakness and inability to walk.  PMH positive for morbid obesity, arthritis. Asthma, CHF, COPD (on home O2), depression (still dealing with son's tragic death), HTN, gout GERD, DM, bipolar, OSA, tobacco use, HLD, pulmonary edema, thyroid  cancer and anasarca.  Clinical Impression  Patient presents with decreased mobility due to generalized weakness, decreased activity tolerance, decreased balance and decreased safety awareness.  Previously at home alone with aide help 5 days a week for about 2-3 hours.  She was having difficulty getting up from couch to go to the bathroom.  Patient will benefit from skilled PT while in the acute setting and will need inpatient rehab (<3 hours/day) at d/c.         If plan is discharge home, recommend the following: Two people to help with bathing/dressing/bathroom;Assist for transportation;Help with stairs or ramp for entrance;Two people to help with walking and/or transfers   Can travel by private vehicle   No    Equipment Recommendations Hoyer lift;BSC/3in1;Wheelchair (measurements PT) (if home; bariatric [facility])  Recommendations for Other Services       Functional Status Assessment Patient has had a recent decline in their functional status and demonstrates the ability to make significant improvements in function in a reasonable and predictable amount of time.     Precautions / Restrictions Precautions Precautions: Fall Recall of Precautions/Restrictions: Impaired      Mobility  Bed Mobility Overal bed mobility: Needs Assistance Bed Mobility: Supine to Sit, Sit to Supine     Supine to sit: Mod assist, Used rails, HOB elevated Sit to supine: +2 for physical assistance, Total assist   General bed mobility  comments: up to EOB (stretcher) using rail on L and A with sheet to pull legs off EOB; pt lifting trunk with rail though CGA for balance and safety, lowered foot of bed to allow pt to scoot L hip closer to get L foot on the floor eventually; NT in the room to assist wtih sit to supine and rolling for linen change, scooting up in bed    Transfers Overall transfer level: Needs assistance Equipment used: Rolling walker (2 wheels) Transfers: Sit to/from Stand Sit to Stand: Mod assist, +2 physical assistance           General transfer comment: 4 attempts with pt holding walker with PT then PT and NT though pt not pushing up with legs with attempts so returned to supine    Ambulation/Gait                  Stairs            Wheelchair Mobility     Tilt Bed    Modified Rankin (Stroke Patients Only)       Balance Overall balance assessment: Needs assistance   Sitting balance-Leahy Scale: Fair                                       Pertinent Vitals/Pain Pain Assessment Pain Assessment: Faces Faces Pain Scale: Hurts whole lot Pain Location: generalized esp L LE Pain Descriptors / Indicators: Aching, Moaning Pain Intervention(s): Monitored during session, Repositioned    Home Living Family/patient expects to be discharged to:: Private residence  Living Arrangements: Alone Available Help at Discharge: Personal care attendant Type of Home: Apartment Home Access: Level entry       Home Layout: One level Home Equipment: Agricultural consultant (2 wheels);Rollator (4 wheels) Additional Comments: has an aide M-F for 2 hours; wears 2L O2 baseline    Prior Function Prior Level of Function : Needs assist             Mobility Comments: has been struggling to get up even to use the bathroom lately and family unable to help her up       Extremity/Trunk Assessment   Upper Extremity Assessment Upper Extremity Assessment: Generalized weakness    Lower  Extremity Assessment Lower Extremity Assessment: LLE deficits/detail;RLE deficits/detail RLE Deficits / Details: Limited AAROM due to edema, pannus in the way and pain LLE Deficits / Details: did not allow AAROM due to pain LLE: Unable to fully assess due to pain    Cervical / Trunk Assessment Cervical / Trunk Assessment: Other exceptions Cervical / Trunk Exceptions: large pannus/edema limiting trunk flexion  Communication   Communication Communication: No apparent difficulties    Cognition Arousal: Lethargic Behavior During Therapy: Anxious   PT - Cognitive impairments: Attention, Initiation, Problem solving, Safety/Judgement                       PT - Cognition Comments: slow to initiate movement, sustained attention to mobility tasks, slow processing and very fearful of falling Following commands: Impaired Following commands impaired: Follows one step commands with increased time     Cueing       General Comments General comments (skin integrity, edema, etc.): soiled with urine in the bed, assist for linen change then placed bed pan as pt still feeling need to urinate; daughter arrived end of session and reported pt unable to return home unless has 24 hour care (aide stays 10:15-12:45 daily, supposed to be there longer, but does not stay)    Exercises     Assessment/Plan    PT Assessment Patient needs continued PT services  PT Problem List Decreased strength;Decreased activity tolerance;Decreased balance;Decreased mobility;Decreased safety awareness       PT Treatment Interventions DME instruction;Gait training;Patient/family education;Functional mobility training;Therapeutic activities;Therapeutic exercise;Balance training    PT Goals (Current goals can be found in the Care Plan section)  Acute Rehab PT Goals Patient Stated Goal: get more help PT Goal Formulation: With patient/family Time For Goal Achievement: 09/21/23 Potential to Achieve Goals: Fair     Frequency Min 2X/week     Co-evaluation               AM-PAC PT "6 Clicks" Mobility  Outcome Measure Help needed turning from your back to your side while in a flat bed without using bedrails?: Total Help needed moving from lying on your back to sitting on the side of a flat bed without using bedrails?: Total Help needed moving to and from a bed to a chair (including a wheelchair)?: Total Help needed standing up from a chair using your arms (e.g., wheelchair or bedside chair)?: Total Help needed to walk in hospital room?: Total Help needed climbing 3-5 steps with a railing? : Total 6 Click Score: 6    End of Session Equipment Utilized During Treatment: Gait belt Activity Tolerance: Patient limited by fatigue Patient left: in bed;with call bell/phone within reach;with family/visitor present Nurse Communication: Mobility status PT Visit Diagnosis: Other abnormalities of gait and mobility (R26.89);Muscle weakness (generalized) (M62.81)  Time: 1206-1253 PT Time Calculation (min) (ACUTE ONLY): 47 min   Charges:   PT Evaluation $PT Eval High Complexity: 1 High PT Treatments $Therapeutic Activity: 23-37 mins PT General Charges $$ ACUTE PT VISIT: 1 Visit         Abigail Hoff, PT Acute Rehabilitation Services Office:[phone] 09/07/2023   Marley Simmers 09/07/2023, 2:04 PM

## 2023-09-07 NOTE — NC FL2 (Signed)
 [place]  MEDICAID FL2 LEVEL OF CARE FORM     IDENTIFICATION  Patient Name: Michele Owens Birthdate: 19-Dec-1960 Sex: female Admission Date (Current Location): 09/06/2023  [facility] and [place] Number:  Producer, television/film/video and Address:  [facility] [place], [address] [place], [place] [address]      Provider Number: [id number]  Attending Physician Name and Address:  System, Provider Not In  Relative Name and Phone Number:       Current Level of Care: Hospital Recommended Level of Care: Skilled Nursing Facility Prior Approval Number:    Date Approved/Denied:   PASRR Number: [id number]  Discharge Plan: SNF    Current Diagnoses: Patient Active Problem List   Diagnosis Date Noted   History of COPD 06/06/2023   Bipolar disorder (HCC) 06/06/2023   At risk for polypharmacy 02/07/2023   Oxygen  dependent 02/07/2023   Acute respiratory failure with hypercapnia (HCC) 12/14/2022   Delirium due to multiple etiologies, acute, hyperactive 12/14/2022   Acute respiratory failure with hypoxia and hypercapnia (HCC) 10/25/2022   Acute on chronic diastolic CHF (congestive heart failure) (HCC) 10/23/2022   Current mild episode of major depressive disorder without prior episode (HCC) 06/06/2022   Fever 06/04/2022   Acute on chronic diastolic (congestive) heart failure (HCC) 05/16/2022   Acute kidney injury superimposed on chronic kidney disease (HCC) 02/01/2022   Hypothyroidism 02/01/2022   Chronic pain disorder 02/01/2022   Respiratory failure with hypercapnia (HCC) 12/23/2020   Anemia 12/23/2020   Diabetic peripheral neuropathy (HCC) 08/18/2020   Acute on chronic respiratory failure with hypoxia (HCC) 04/18/2020   Hemoptysis 04/18/2020   Acute encephalopathy 04/18/2020   Chronic diastolic CHF (congestive heart failure) (HCC) 04/18/2020   Sepsis (HCC) 04/18/2020   Severe obesity (BMI >= 40) (HCC)    Fall at home, initial encounter 06/23/2018   HNP  (herniated nucleus pulposus), thoracic 06/23/2018   CHF (congestive heart failure) (HCC) 06/18/2018   Essential hypertension 08/28/2017   Obesity 08/28/2017   COPD exacerbation (HCC) 08/04/2017   Acute and chronic respiratory failure with hypercapnia (HCC)    Pulmonary HTN (HCC)    Genetic testing 02/27/2017   Family history of thyroid  cancer    Hypomagnesemia 10/03/2016   Atypical chest pain    HLD (hyperlipidemia) 09/23/2016   COPD (chronic obstructive pulmonary disease) (HCC) 09/23/2016   Gout 09/23/2016   DVT (deep vein thrombosis) in pregnancy 09/23/2016   Thyroid  cancer (HCC) 09/23/2016   Hypocalcemia 09/23/2016   Acute pulmonary edema (HCC)    Acute hypoxemic respiratory failure (HCC)    Chronic hypoxic respiratory failure (HCC) 07/16/2016   CAP (community acquired pneumonia) 01/23/2016   Multinodular goiter w/ dominant right thyroid  nodule 01/23/2016   Pulmonary hypertension (HCC) 01/23/2016   Obesity hypoventilation syndrome (HCC) 08/26/2015   Chest pain 06/16/2015   Non-insulin  dependent type 2 diabetes mellitus (HCC) 04/24/2015   Controlled type 2 diabetes mellitus without complication, without long-term current use of insulin  (HCC) 04/24/2015   Depression with anxiety 04/24/2015   Benign essential HTN 04/24/2015   Normocytic anemia 04/24/2015   Anxiety 04/24/2015   OSA (obstructive sleep apnea) 05/10/2012   Tobacco abuse 07/04/2011    Orientation RESPIRATION BLADDER Height & Weight     Self, Time, Situation, Place  O2, Normal (4L [place]) Continent Weight:   Height:     BEHAVIORAL SYMPTOMS/MOOD NEUROLOGICAL BOWEL NUTRITION STATUS      Continent Diet (Regular Diet)  AMBULATORY STATUS COMMUNICATION OF NEEDS Skin   Extensive Assist  Verbally Normal                       Personal Care Assistance Level of Assistance  Bathing, Feeding, Dressing Bathing Assistance: Maximum assistance Feeding assistance: Limited assistance Dressing Assistance: Maximum assistance      Functional Limitations Info  Sight, Speech, Hearing Sight Info: Impaired (Glasses) Hearing Info: Adequate Speech Info: Adequate    SPECIAL CARE FACTORS FREQUENCY  PT (By licensed PT), OT (By licensed OT)     PT Frequency: 5x weekly OT Frequency: 5x weekly            Contractures Contractures Info: Not present    Additional Factors Info  Code Status, Allergies, Isolation Precautions Code Status Info: Full Code Allergies Info: Bee Venom  Fioricet  (Butalbital -apap-caffeine )  Ibuprofen   Lamisil  (Terbinafine )  Nsaids     Isolation Precautions Info: Enteric precautions     Current Medications (09/07/2023):  This is the current hospital active medication list Current Facility-Administered Medications  Medication Dose Route Frequency Provider Last Rate Last Admin   albuterol  (PROVENTIL ) (2.5 MG/3ML) 0.083% nebulizer solution 2.5 mg  2.5 mg Inhalation Q6H PRN Horton, Kristie M, DO       aspirin  EC tablet 81 mg  81 mg Oral Daily Horton, Kristie M, DO   81 mg at 09/07/23 1016   budesonide  (PULMICORT ) nebulizer solution 0.25 mg  0.25 mg Nebulization BID Horton, Kristie M, DO   0.25 mg at 09/07/23 1610   busPIRone  (BUSPAR ) tablet 5 mg  5 mg Oral TID Horton, Kristie M, DO   5 mg at 09/07/23 1017   carvedilol  (COREG ) tablet 6.25 mg  6.25 mg Oral BID Horton, Kristie M, DO   6.25 mg at 09/07/23 9604   dapagliflozin  propanediol (FARXIGA ) tablet 10 mg  10 mg Oral QAC breakfast Horton, Kristie M, DO   10 mg at 09/07/23 5409   dicyclomine  (BENTYL ) tablet 20 mg  20 mg Oral BID Horton, Kristie M, DO   20 mg at 09/07/23 1016   levothyroxine  (SYNTHROID ) tablet 125 mcg  125 mcg Oral QHS Horton, Kristie M, DO   125 mcg at 09/07/23 0051   loratadine  (CLARITIN ) tablet 10 mg  10 mg Oral QHS Horton, Kristie M, DO   10 mg at 09/07/23 0050   losartan  (COZAAR ) tablet 25 mg  25 mg Oral Daily Horton, Kristie M, DO   25 mg at 09/07/23 1017   nystatin  cream (MYCOSTATIN ) 1 Application  1 Application Topical  BID PRN Horton, Kristie M, DO       ondansetron  (ZOFRAN ) tablet 4 mg  4 mg Oral Q8H PRN Horton, Kristie M, DO       pantoprazole  (PROTONIX ) EC tablet 40 mg  40 mg Oral Daily Horton, Kristie M, DO   40 mg at 09/07/23 1016   polyethylene glycol (MIRALAX  / GLYCOLAX ) packet 17 g  17 g Oral Daily PRN Horton, Kristie M, DO       potassium chloride  (KLOR-CON  M) CR tablet 10 mEq  10 mEq Oral BID Horton, Kristie M, DO   10 mEq at 09/07/23 1016   predniSONE  (DELTASONE ) tablet 40 mg  40 mg Oral Daily Horton, Kristie M, DO   40 mg at 09/07/23 8119   rosuvastatin  (CRESTOR ) tablet 10 mg  10 mg Oral QPM Horton, Kristie M, DO       senna-docusate (Senokot-S) tablet 1 tablet  1 tablet Oral Daily Horton, Kristie M, DO   1 tablet at 09/07/23 1016  spironolactone  (ALDACTONE ) tablet 25 mg  25 mg Oral Daily Horton, Kristie M, DO   25 mg at 09/07/23 1017   torsemide  (DEMADEX ) tablet 40 mg  40 mg Oral BID Horton, Kristie M, DO   40 mg at 09/07/23 0981   traZODone  (DESYREL ) tablet 100 mg  100 mg Oral QHS Horton, Kristie M, DO   100 mg at 09/07/23 0050   umeclidinium-vilanterol (ANORO ELLIPTA ) 62.5-25 MCG/ACT 1 puff  1 puff Inhalation Daily Horton, Kristie M, DO   1 puff at 09/07/23 1000   zolpidem  (AMBIEN ) tablet 10 mg  10 mg Oral QHS Horton, Kristie M, DO   10 mg at 09/07/23 0050   Current Outpatient Medications  Medication Sig Dispense Refill   ACETAMINOPHEN  PO Take 1,300-2,050 mg by mouth daily as needed for moderate pain (pain score 4-6) or headache.     albuterol  (VENTOLIN  HFA) 108 (90 Base) MCG/ACT inhaler Inhale 1-2 puffs into the lungs every 6 (six) hours as needed for wheezing or shortness of breath. 1 each 0   aspirin  EC 81 MG tablet Take 81 mg by mouth daily. Swallow whole.     budesonide  (PULMICORT ) 0.25 MG/2ML nebulizer solution Inhale 2 mLs (0.25 mg total) by nebulization 2 (two) times daily. (Patient taking differently: Take 0.25 mg by nebulization daily as needed (for shortness of breath or wheezing).)  120 mL 0   busPIRone  (BUSPAR ) 5 MG tablet Take 1 tablet (5 mg total) by mouth 3 (three) times daily. (Patient taking differently: Take 10 mg by mouth 3 (three) times daily.) 90 tablet 0   butalbital -acetaminophen -caffeine  (FIORICET ) 50-325-40 MG tablet Take 1-2 tablets by mouth every 6 (six) hours as needed for headache. 20 tablet 0   calcitRIOL  (ROCALTROL ) 0.5 MCG capsule Take 1 capsule (0.5 mcg total) by mouth daily. 30 capsule 0   carvedilol  (COREG ) 6.25 MG tablet Take 1 tablet (6.25 mg total) by mouth 2 (two) times daily. 60 tablet 11   dicyclomine  (BENTYL ) 20 MG tablet Take 1 tablet (20 mg total) by mouth 2 (two) times daily. 20 tablet 0   FARXIGA  10 MG TABS tablet Take 1 tablet (10 mg total) by mouth daily before breakfast. 30 tablet 11   fenofibrate  (TRICOR ) 145 MG tablet Take 1 tablet (145 mg total) by mouth daily. 30 tablet 11   levothyroxine  (SYNTHROID ) 125 MCG tablet Take 125 mcg by mouth at bedtime.     losartan  (COZAAR ) 25 MG tablet Take 1 tablet (25 mg total) by mouth daily. 30 tablet 11   nystatin  cream (MYCOSTATIN ) Apply 1 Application topically 2 (two) times daily as needed for dry skin.     omeprazole  (PRILOSEC) 20 MG capsule Take 1 capsule (20 mg total) by mouth 2 (two) times daily as needed (acid reflux). (Patient taking differently: Take 40 mg by mouth daily.) 60 capsule 0   OXYGEN  Inhale 2 L into the lungs continuous.     polyethylene glycol (MIRALAX  / GLYCOLAX ) 17 g packet Take 17 g by mouth daily as needed for mild constipation. 30 each 0   potassium chloride  (KLOR-CON ) 10 MEQ tablet Take 1 tablet (10 mEq total) by mouth 2 (two) times daily. 60 tablet 6   rosuvastatin  (CRESTOR ) 10 MG tablet Take 1 tablet (10 mg total) by mouth every evening. 30 tablet 11   senna-docusate (SENOKOT-S) 8.6-50 MG tablet Take 1 tablet by mouth daily. 30 tablet 5   spironolactone  (ALDACTONE ) 25 MG tablet Take 1 tablet (25 mg total) by mouth daily. 30 tablet 11  torsemide  (DEMADEX ) 20 MG tablet  Take 2 tablets (40 mg total) by mouth 2 (two) times daily. 120 tablet 11   traZODone  (DESYREL ) 50 MG tablet Take 2 tablets (100 mg total) by mouth at bedtime. 60 tablet 0   [Paused] allopurinol  (ZYLOPRIM ) 100 MG tablet Take 100 mg by mouth daily. (Patient not taking: Reported on 06/06/2023)     blood glucose meter kit and supplies KIT Dispense based on patient and insurance preference. Use up to four times daily as directed. 1 each 0   doxycycline  (VIBRAMYCIN ) 100 MG capsule Take 1 capsule (100 mg total) by mouth 2 (two) times daily. 14 capsule 0   EPINEPHrine  0.3 mg/0.3 mL IJ SOAJ injection Inject 0.3 mg into the muscle once as needed for anaphylaxis.     FEROSUL 325 (65 Fe) MG tablet Take 1 tablet (325 mg total) by mouth daily. 30 tablet 1   levocetirizine (XYZAL ) 5 MG tablet Take 1 tablet (5 mg total) by mouth at bedtime. 30 tablet 0   nitroGLYCERIN  (NITROSTAT ) 0.4 MG SL tablet Place 1 tablet (0.4 mg total) under the tongue every 5 (five) minutes as needed for chest pain. 12 tablet 6   ondansetron  (ZOFRAN ) 4 MG tablet Take 1 tablet (4 mg total) by mouth every 8 (eight) hours as needed for nausea or vomiting. 20 tablet 0   ondansetron  (ZOFRAN -ODT) 4 MG disintegrating tablet Take 1 tablet (4 mg total) by mouth every 8 (eight) hours as needed. 20 tablet 0   OZEMPIC, 0.25 OR 0.5 MG/DOSE, 2 MG/3ML SOPN Inject 0.5 mg into the skin every Friday.     predniSONE  (DELTASONE ) 10 MG tablet Take 4 tablets (40 mg total) by mouth daily. 16 tablet 0   umeclidinium-vilanterol (ANORO ELLIPTA ) 62.5-25 MCG/ACT AEPB Inhale 1 puff into the lungs daily. 98 each 0   Vitamin D, Ergocalciferol, (DRISDOL) 1.25 MG (50000 UNIT) CAPS capsule Take 50,000 Units by mouth once a week.     zolpidem  (AMBIEN ) 10 MG tablet Take 10 mg by mouth at bedtime.       Discharge Medications: Please see discharge summary for a list of discharge medications.  Relevant Imaging Results:  Relevant Lab Results:   Additional  Information SSN: [id number]  Dexter Forest, LCSW

## 2023-09-08 NOTE — Progress Notes (Signed)
 CSW spoke with patient's daughter Odilia Bennett to discuss bed offer from [facility]. Odilia Bennett states she is agreeable to accept bed offer.   CSW spoke with [place] at [facility] who confirms patient's bed offer. [place] states the facility will initiate insurance authorization.   Shepard Dicker, MSW, LCSW Transitions of Care  Clinical Social Worker II [phone]

## 2023-09-08 NOTE — ED Notes (Signed)
 Pt asking for a snack, stating that she has not had anything to eat. Informed pt was already provided a snack earlier. Pt stated is was not enough. Pt provided a diet coke, 3 packs of gram crackers with peanut butter per request.

## 2023-09-08 NOTE — ED Provider Notes (Signed)
 Emergency Medicine Observation Re-evaluation Note  Michele Owens is a 63 y.o. female, seen on rounds today.  Pt initially presented to the ED for complaints of Failure To Thrive Currently, the patient is sitting up in bed eating  Physical Exam  BP 109/65   Pulse 72   Temp 98.4 F (36.9 C)   Resp 18   SpO2 96%  Physical Exam General: NAD Cardiac: Regular HR Lungs: No resp distress  On skin exam the patient has reddening of the skin under her lower abdominal pannus with nystatin  or antifungal cream applied   ED Course / MDM  EKG:   I have reviewed the labs performed to date as well as medications administered while in observation.    Plan   There is concern from the nursing staff that the patient has been increasingly confused overnight.  She has been speaking to family members who are not present, and seeing a happy birthday.  The patient is directable on my exam and does not appear floridly confused but this is raise concern for possible infection or delirium.  Upon reviewing her medical chart.  There were findings concerning for cellulitis on her initial CT scan with a white blood cell count.  She has not on antibiotics.  Her UA which was also collected just this morning could be consistent with an infection.  I have ordered IV Rocephin  to cross cover for cellulitis and urinary tract infection.  I will discuss with the hospital team medical admission at this time for encephalopathy, cellulitis, potential urinary tract infection.   Arvilla Birmingham, MD 09/09/23 [phone]

## 2023-09-08 NOTE — ED Notes (Signed)
 Pt just called out stating she is a diabetic and has to have something else to eat. This tech informed pt we are aware she is a diabetic and  a meal tray has been ordered for her to receive breakfast in the morning.

## 2023-09-09 DIAGNOSIS — J449 Chronic obstructive pulmonary disease, unspecified: Secondary | ICD-10-CM | POA: Diagnosis not present

## 2023-09-09 DIAGNOSIS — I5030 Unspecified diastolic (congestive) heart failure: Secondary | ICD-10-CM

## 2023-09-09 DIAGNOSIS — J9611 Chronic respiratory failure with hypoxia: Secondary | ICD-10-CM

## 2023-09-09 DIAGNOSIS — N390 Urinary tract infection, site not specified: Secondary | ICD-10-CM

## 2023-09-09 DIAGNOSIS — Z6841 Body Mass Index (BMI) 40.0 and over, adult: Secondary | ICD-10-CM

## 2023-09-09 DIAGNOSIS — L0390 Cellulitis, unspecified: Secondary | ICD-10-CM | POA: Diagnosis not present

## 2023-09-09 DIAGNOSIS — E66813 Obesity, class 3: Secondary | ICD-10-CM

## 2023-09-09 LAB — URINALYSIS, ROUTINE W REFLEX MICROSCOPIC
Bilirubin Urine: NEGATIVE
Glucose, UA: >=500 mg/dL — AB
Ketones, ur: NEGATIVE mg/dL
Nitrite: NEGATIVE
Protein, ur: NEGATIVE mg/dL
Specific Gravity, Urine: 1.008 (ref 1.005–1.030)
pH: 7 (ref 5.0–8.0)

## 2023-09-09 LAB — COMPREHENSIVE METABOLIC PANEL WITH GFR
ALT: 37 U/L (ref 0–44)
AST: 28 U/L (ref 15–41)
Albumin: 3.8 g/dL (ref 3.5–5.0)
Alkaline Phosphatase: 57 U/L (ref 38–126)
Anion gap: 17 — ABNORMAL HIGH (ref 5–15)
BUN: 22 mg/dL (ref 8–23)
CO2: 33 mmol/L — ABNORMAL HIGH (ref 22–32)
Calcium: 8.9 mg/dL (ref 8.9–10.3)
Chloride: 89 mmol/L — ABNORMAL LOW (ref 98–111)
Creatinine, Ser: 1.44 mg/dL — ABNORMAL HIGH (ref 0.44–1.00)
GFR, Estimated: 41 mL/min — ABNORMAL LOW (ref >60)
Glucose, Bld: 263 mg/dL — ABNORMAL HIGH (ref 70–99)
Potassium: 3.6 mmol/L (ref 3.5–5.1)
Sodium: 139 mmol/L (ref 135–145)
Total Bilirubin: 0.5 mg/dL (ref 0.0–1.2)
Total Protein: 7.3 g/dL (ref 6.5–8.1)

## 2023-09-09 LAB — HEMOGLOBIN A1C
Hgb A1c MFr Bld: 6.8 % — ABNORMAL HIGH (ref 4.8–5.6)
Mean Plasma Glucose: 148.46 mg/dL

## 2023-09-09 LAB — GLUCOSE, CAPILLARY
Glucose-Capillary: 154 mg/dL — ABNORMAL HIGH (ref 70–99)
Glucose-Capillary: 239 mg/dL — ABNORMAL HIGH (ref 70–99)

## 2023-09-09 MED ORDER — SODIUM CHLORIDE 0.9 % IV SOLN: 2.0000 g | INTRAVENOUS | Status: DC

## 2023-09-09 MED ORDER — DIPHENHYDRAMINE HCL 50 MG/ML IJ SOLN
25.0000 mg | Freq: Once | INTRAMUSCULAR | Status: DC
  Filled 2023-09-09: qty 1

## 2023-09-09 MED ORDER — INSULIN ASPART 100 UNIT/ML IJ SOLN
0.0000 [IU] | Freq: Three times a day (TID) | INTRAMUSCULAR | Status: DC
  Administered 2023-09-09 – 2023-09-11 (×4): 3 [IU] via SUBCUTANEOUS
  Administered 2023-09-11: 1 [IU] via SUBCUTANEOUS

## 2023-09-09 MED ORDER — ONDANSETRON HCL 4 MG/2ML IJ SOLN: 4.0000 mg | Freq: Four times a day (QID) | INTRAMUSCULAR | Status: DC | PRN

## 2023-09-09 MED ORDER — TRAMADOL HCL 50 MG PO TABS
50.0000 mg | ORAL_TABLET | Freq: Four times a day (QID) | ORAL | Status: DC | PRN
  Administered 2023-09-09 – 2023-09-11 (×4): 50 mg via ORAL
  Filled 2023-09-09 (×4): qty 1

## 2023-09-09 MED ORDER — BUDESONIDE 0.25 MG/2ML IN SUSP
0.2500 mg | Freq: Two times a day (BID) | RESPIRATORY_TRACT | Status: DC
  Administered 2023-09-09 – 2023-09-11 (×5): 0.25 mg via RESPIRATORY_TRACT
  Filled 2023-09-09 (×5): qty 2

## 2023-09-09 MED ORDER — ENOXAPARIN SODIUM 80 MG/0.8ML IJ SOSY
70.0000 mg | PREFILLED_SYRINGE | INTRAMUSCULAR | Status: DC
  Administered 2023-09-09 – 2023-09-11 (×3): 70 mg via SUBCUTANEOUS
  Filled 2023-09-09 (×3): qty 0.8

## 2023-09-09 MED ORDER — ONDANSETRON HCL 4 MG PO TABS: 4.0000 mg | ORAL_TABLET | Freq: Four times a day (QID) | ORAL | Status: DC | PRN

## 2023-09-09 MED ORDER — SODIUM CHLORIDE 0.9 % IV SOLN: 1.0000 g | Freq: Once | INTRAVENOUS | Status: DC

## 2023-09-09 MED ORDER — SODIUM CHLORIDE 0.9 % IV SOLN
2.0000 g | INTRAVENOUS | Status: DC
  Administered 2023-09-09 – 2023-09-11 (×3): 2 g via INTRAVENOUS
  Filled 2023-09-09 (×3): qty 20

## 2023-09-09 MED ORDER — VANCOMYCIN HCL 1750 MG/350ML IV SOLN
1750.0000 mg | Freq: Once | INTRAVENOUS | Status: AC
  Administered 2023-09-09: 1750 mg via INTRAVENOUS
  Filled 2023-09-09 (×2): qty 350

## 2023-09-09 MED ORDER — CLOTRIMAZOLE 1 % EX CREA
TOPICAL_CREAM | Freq: Two times a day (BID) | CUTANEOUS | Status: DC
  Administered 2023-09-09: 1 via TOPICAL
  Filled 2023-09-09 (×2): qty 15

## 2023-09-09 MED ORDER — VANCOMYCIN HCL 1500 MG/300ML IV SOLN
1500.0000 mg | INTRAVENOUS | Status: DC
  Filled 2023-09-09: qty 300

## 2023-09-09 NOTE — Assessment & Plan Note (Signed)
 Cont statin

## 2023-09-09 NOTE — Assessment & Plan Note (Signed)
 2D ECHO 05/2023 w/ EF 50-55% and grade 1 diastolic dysfunction  Appears euvolemic  Cont home regimen

## 2023-09-09 NOTE — Consult Note (Addendum)
 WOC Nurse Consult Note: Reason for Consult: ? Cellulitis pannus, R breast ITD  Wound type: Intertriginous dermatitis underneath pannus/abdominal folds and R breast  ICD-10 CM Codes for Irritant Dermatitis  L30.4  - Erythema intertrigo. Also used for abrasion of the hand, chafing of the skin, dermatitis due to sweating and friction, friction dermatitis, friction eczema, and genital/thigh intertrigo.  Pressure Injury POA: NA not related to pressure  Measurement: widespread under right breast and pannus  Wound bed: mild erythema with ? Satellite lesions indicating possible fungal component  Drainage (amount, consistency, odor) see nursing flowsheet  Periwound: Dressing procedure/placement/frequency: Cleanse under R breast and pannus/abdominal folds with Vashe wound cleanser Timm Foot [phone]) do not rinse and allow to air dry.  Apply Nystatin  cream as ordered by physician then apply Interdry AG Order [place] # [phone] Measure and cut length of InterDry to fit in skin folds that have skin breakdown Tuck InterDry fabric into skin folds in a single layer, allow for 2 inches of overhang from skin edges to allow for wicking to occur May remove to bathe; dry area thoroughly and then tuck into affected areas again Do not apply any creams or ointments when using InterDry DO NOT THROW AWAY FOR 5 DAYS unless soiled with stool DO NOT [facility] product, this will inactivate the silver in the material  New sheet of Interdry should be applied after 5 days of use if patient continues to have skin breakdown    POC discussed with bedside nurse. Appreciate A. Parke Boll, Rn assistance with this consult.  WOC team will not follow. Re-consult if further needs arise.     Thank you,    Ronni Colace MSN, RN-BC, [employer] [phone]

## 2023-09-09 NOTE — ED Notes (Signed)
 Patient Michele Owens came out when patient came to edge of bed and her bed pad got wet and was changed.

## 2023-09-09 NOTE — Assessment & Plan Note (Signed)
 CPAP.

## 2023-09-09 NOTE — Plan of Care (Signed)

## 2023-09-09 NOTE — Progress Notes (Signed)
 Pharmacy Antibiotic Note  Michele Owens is a 63 y.o. female admitted on 09/06/2023 with cellulitis.  Pharmacy has been consulted for vancomycin  dosing.  Plan: Vancomycin  1750mg  IV x 1, then 1500 mg IV q 24h (eAUC 478) Monitor renal function, Cx and clinical progression to narrow Vancomycin  levels as indicated       Temp (24hrs), Avg:98.6 F (37 C), Min:98.6 F (37 C), Max:98.6 F (37 C)  Recent Labs  Lab 09/06/23 1836  WBC 15.8*  CREATININE 0.94    CrCl cannot be calculated (Unknown ideal weight.).    Allergies  Allergen Reactions   Bee Venom Anaphylaxis    "All over my body" (swelling)   Fioricet  [Butalbital -Apap-Caffeine ] Nausea And Vomiting and Rash   Ibuprofen  Rash and Other (See Comments)    Severe rash   Lamisil  [Terbinafine ] Rash and Other (See Comments)    Pt states this causes her to "feel funny"   Nsaids Other (See Comments)    Per MD's orders     Trinidad Funk, PharmD, [facility] Clinical Pharmacist ED Pharmacist Phone # [phone] 09/09/2023 10:08 AM

## 2023-09-09 NOTE — ED Provider Notes (Signed)
 IM service reports patient follows with [facility] which is confirmed - hospitalist will be paged for admission.   Arvilla Birmingham, MD 09/09/23 [phone]

## 2023-09-09 NOTE — ED Notes (Signed)
 Pt is not able to walk or take care of her self. Pt oriented multiple time to don't get out bed without assistance, pt continues to get up and then she is not able to get on bed even with assistance from staff. Pt continues to be rude with nursing staff every time staff is trying to help and refuses to follow commands.  Pt  oriented about been a fall risk and to prevent hurting her self and staff. Floor pads placed around the stretcher to prevent injuries from falling.

## 2023-09-09 NOTE — H&P (Signed)
 History and Physical    Patient: Michele Owens VWU:[id number] DOB: 1960-11-10 DOA: 09/06/2023 DOS: the patient was seen and examined on 09/09/2023 PCP: [facility]  Patient coming from: Home  Chief Complaint:  Chief Complaint  Patient presents with   Failure To Thrive   HPI: Michele Owens is a 63 y.o. female with medical history significant of COPD, chronic respiratory failure, primary hypertension, morbid obesity, OSA, hyperlipidemia, HFpEF, gout, hypothyroidism, bipolar disorder, diabetes mellitus presenting w/ cellulitis/panniculitis, UTI, deconditioning.  Pt initially evaluated around May 29th for issues including deconditioning, failure to thrive at home. There was some concern for patient's safety at home. Has been boarding in ER since then with plan for ultimate SNF placement. Per report, pt with worsening lower abdominal pain. Pt noted to have had issues with thickened skin over pannus area concerning for cellulitis vs. Panniculitis. Pt reports worsening abdominal pain over multiple days. Also w/ dysuria over multiple days with concern for UTI. Pt was started on IV abx for skin and genitourinary coverage. Pt still with mild pain. No fevers or chills. No diarrhea. Still with lower abdominal pain. Does report worsening functional status and inability to care for herself at home.  Currently in ER afebrile, hemodynamically stable. Satting in mid 90s on 2L- Chronic. WBC 15.8, hgb 11, plt 264, Cr 0.94, Na 147. CT A&P 5/29 with skin thickening and soft tissue stranding involving the periumbilical region and infraumbilical pannus, correlate for cellulitis Review of Systems: As mentioned in the history of present illness. All other systems reviewed and are negative. Past Medical History:  Diagnosis Date   Anxiety    Arthritis    Asthma    Chronic diastolic CHF (congestive heart failure) (HCC)    COPD (chronic obstructive pulmonary disease) (HCC)    Uses Oxygen  at night    Depression    Diabetes mellitus without complication (HCC)    GERD (gastroesophageal reflux disease)    Gout    Headache    migraines   History of DVT of lower extremity    Hypertension    Morbid obesity with BMI of 45.0-49.9, adult (HCC)    Thyroid  cancer (HCC) 09/23/2016   Past Surgical History:  Procedure Laterality Date   BUNIONECTOMY Bilateral    CARDIAC CATHETERIZATION N/A 06/23/2015   Procedure: Right/Left Heart Cath and Coronary Angiography;  Surgeon: Darlis Eisenmenger, MD;  Location: [facility] INVASIVE CV LAB;  Service: Cardiovascular;  Laterality: N/A;   COLONOSCOPY WITH PROPOFOL  N/A 12/15/2015   Procedure: COLONOSCOPY WITH PROPOFOL ;  Surgeon: Delilah Fend, MD;  Location: [facility] ENDOSCOPY;  Service: Endoscopy;  Laterality: N/A;   RIGHT HEART CATH N/A 10/01/2019   Procedure: RIGHT HEART CATH;  Surgeon: Darlis Eisenmenger, MD;  Location: [facility] INVASIVE CV LAB;  Service: Cardiovascular;  Laterality: N/A;   THYROIDECTOMY  09/19/2016   THYROIDECTOMY N/A 09/19/2016   Procedure: TOTAL THYROIDECTOMY;  Surgeon: Oralee Billow, MD;  Location: [facility] OR;  Service: General;  Laterality: N/A;   TONSILLECTOMY     TOTAL ABDOMINAL HYSTERECTOMY  07/14/10   Social History:  reports that she quit smoking about 7 years ago. Her smoking use included cigarettes. She started smoking about 48 years ago. She has a 20.5 pack-year smoking history. She has never used smokeless tobacco. She reports that she does not currently use alcohol. She reports that she does not currently use drugs after having used the following drugs: Marijuana and Cocaine.  Allergies  Allergen Reactions   Bee Venom Anaphylaxis    "  All over my body" (swelling)   Fioricet  [Butalbital -Apap-Caffeine ] Nausea And Vomiting and Rash   Ibuprofen  Rash and Other (See Comments)    Severe rash   Lamisil  [Terbinafine ] Rash and Other (See Comments)    Pt states this causes her to "feel funny"   Nsaids Other (See Comments)    Per MD's orders     Family History   Problem Relation Age of Onset   Cancer Father        thought to be due to exposure to concrete   Diabetes Mother    Thyroid  cancer Mother        dx in her 21s-60s   Cancer Maternal Uncle        2 uncles with cancer NOS   Brain cancer Paternal Aunt    Cancer Cousin        maternal first cousin - NOS   Cancer Cousin        maternal first cousin - NOS    Prior to Admission medications   Medication Sig Start Date End Date Taking? Authorizing Provider  ACETAMINOPHEN  PO Take 1,300-2,050 mg by mouth daily as needed for moderate pain (pain score 4-6) or headache.   Yes [provider]  albuterol  (VENTOLIN  HFA) 108 (90 Base) MCG/ACT inhaler Inhale 1-2 puffs into the lungs every 6 (six) hours as needed for wheezing or shortness of breath. 11/24/22  Yes Darlis Eisenmenger, MD  aspirin  EC 81 MG tablet Take 81 mg by mouth daily. Swallow whole.   Yes [provider]  budesonide  (PULMICORT ) 0.25 MG/2ML nebulizer solution Inhale 2 mLs (0.25 mg total) by nebulization 2 (two) times daily. Patient taking differently: Take 0.25 mg by nebulization daily as needed (for shortness of breath or wheezing). 11/24/22  Yes Darlis Eisenmenger, MD  busPIRone  (BUSPAR ) 5 MG tablet Take 1 tablet (5 mg total) by mouth 3 (three) times daily. Patient taking differently: Take 10 mg by mouth 3 (three) times daily. 02/07/23  Yes Henson, Vickie L, NP-C  butalbital -acetaminophen -caffeine  (FIORICET ) 50-325-40 MG tablet Take 1-2 tablets by mouth every 6 (six) hours as needed for headache. 05/08/23 05/07/24 Yes Schuman, Arlin Benes, PA-C  calcitRIOL  (ROCALTROL ) 0.5 MCG capsule Take 1 capsule (0.5 mcg total) by mouth daily. 09/26/16  Yes Hongalgi, Anand D, MD  carvedilol  (COREG ) 6.25 MG tablet Take 1 tablet (6.25 mg total) by mouth 2 (two) times daily. 05/23/23  Yes Milford, Arlice Bene, FNP  dicyclomine  (BENTYL ) 20 MG tablet Take 1 tablet (20 mg total) by mouth 2 (two) times daily. 08/29/23  Yes Henderly, Britni A, PA-C  FARXIGA   10 MG TABS tablet Take 1 tablet (10 mg total) by mouth daily before breakfast. 02/02/23  Yes Darlis Eisenmenger, MD  fenofibrate  (TRICOR ) 145 MG tablet Take 1 tablet (145 mg total) by mouth daily. 02/02/23  Yes Darlis Eisenmenger, MD  levothyroxine  (SYNTHROID ) 125 MCG tablet Take 125 mcg by mouth at bedtime. 05/07/23  Yes [provider]  losartan  (COZAAR ) 25 MG tablet Take 1 tablet (25 mg total) by mouth daily. 02/02/23  Yes Darlis Eisenmenger, MD  nystatin  cream (MYCOSTATIN ) Apply 1 Application topically 2 (two) times daily as needed for dry skin. 09/26/22  Yes [provider]  omeprazole  (PRILOSEC) 20 MG capsule Take 1 capsule (20 mg total) by mouth 2 (two) times daily as needed (acid reflux). Patient taking differently: Take 40 mg by mouth daily. 11/24/22  Yes Darlis Eisenmenger, MD  OXYGEN  Inhale 2 L into the lungs  continuous.   Yes [provider]  polyethylene glycol (MIRALAX  / GLYCOLAX ) 17 g packet Take 17 g by mouth daily as needed for mild constipation. 11/24/22  Yes Darlis Eisenmenger, MD  potassium chloride  (KLOR-CON ) 10 MEQ tablet Take 1 tablet (10 mEq total) by mouth 2 (two) times daily. 07/17/23  Yes Darlis Eisenmenger, MD  rosuvastatin  (CRESTOR ) 10 MG tablet Take 1 tablet (10 mg total) by mouth every evening. 02/02/23  Yes Darlis Eisenmenger, MD  senna-docusate (SENOKOT-S) 8.6-50 MG tablet Take 1 tablet by mouth daily. 02/07/23  Yes Henson, Vickie L, NP-C  spironolactone  (ALDACTONE ) 25 MG tablet Take 1 tablet (25 mg total) by mouth daily. 02/02/23  Yes Darlis Eisenmenger, MD  torsemide  (DEMADEX ) 20 MG tablet Take 2 tablets (40 mg total) by mouth 2 (two) times daily. 11/24/22  Yes Darlis Eisenmenger, MD  traZODone  (DESYREL ) 50 MG tablet Take 2 tablets (100 mg total) by mouth at bedtime. 02/07/23  Yes Henson, Vickie L, NP-C  allopurinol  (ZYLOPRIM ) 100 MG tablet Take 100 mg by mouth daily. Patient not taking: Reported on 06/06/2023    [provider]  blood glucose meter kit  and supplies KIT Dispense based on patient and insurance preference. Use up to four times daily as directed. 12/25/20   Haydee Lipa, MD  doxycycline  (VIBRAMYCIN ) 100 MG capsule Take 1 capsule (100 mg total) by mouth 2 (two) times daily. 06/11/23   Deatra Face, MD  EPINEPHrine  0.3 mg/0.3 mL IJ SOAJ injection Inject 0.3 mg into the muscle once as needed for anaphylaxis.    [provider]  FEROSUL 325 (65 Fe) MG tablet Take 1 tablet (325 mg total) by mouth daily. 02/09/23   Henson, Vickie L, NP-C  levocetirizine (XYZAL ) 5 MG tablet Take 1 tablet (5 mg total) by mouth at bedtime. 11/24/22   Darlis Eisenmenger, MD  nitroGLYCERIN  (NITROSTAT ) 0.4 MG SL tablet Place 1 tablet (0.4 mg total) under the tongue every 5 (five) minutes as needed for chest pain. 05/23/23   Milford, Arlice Bene, FNP  ondansetron  (ZOFRAN ) 4 MG tablet Take 1 tablet (4 mg total) by mouth every 8 (eight) hours as needed for nausea or vomiting. 06/19/23   Carleton Cheek, PA-C  ondansetron  (ZOFRAN -ODT) 4 MG disintegrating tablet Take 1 tablet (4 mg total) by mouth every 8 (eight) hours as needed. 08/29/23   Henderly, Britni A, PA-C  OZEMPIC, 0.25 OR 0.5 MG/DOSE, 2 MG/3ML SOPN Inject 0.5 mg into the skin every Friday. 05/17/23   [provider]  predniSONE  (DELTASONE ) 10 MG tablet Take 4 tablets (40 mg total) by mouth daily. 06/11/23   Nanavati, Ankit, MD  umeclidinium-vilanterol (ANORO ELLIPTA ) 62.5-25 MCG/ACT AEPB Inhale 1 puff into the lungs daily. 02/09/23   Henson, Vickie L, NP-C  Vitamin D, Ergocalciferol, (DRISDOL) 1.25 MG (50000 UNIT) CAPS capsule Take 50,000 Units by mouth once a week. 06/05/23   [provider]  zolpidem  (AMBIEN ) 10 MG tablet Take 10 mg by mouth at bedtime.    [provider]    Physical Exam: Vitals:   09/08/23 1816 09/08/23 2149 09/09/23 1100 09/09/23 1235  BP: 124/63 125/69 (!) 144/72 (!) 114/97  Pulse: 70 69 77 81  Resp: 19 19 20 18   Temp: 98.6 F (37 C) 98.6 F (37 C)   98.7 F (37.1 C)  TempSrc:  Oral  Oral  SpO2: 94% 98% 92% 95%  Weight:   (!) 149.7 kg    Physical Exam Constitutional:  Appearance: She is obese.  HENT:     Head: Normocephalic and atraumatic.     Nose: Nose normal.     Mouth/Throat:     Mouth: Mucous membranes are moist.  Eyes:     Pupils: Pupils are equal, round, and reactive to light.  Cardiovascular:     Rate and Rhythm: Normal rate and regular rhythm.  Pulmonary:     Effort: Pulmonary effort is normal.  Abdominal:     General: Bowel sounds are normal.  Musculoskeletal:        General: Normal range of motion.  Skin:    Comments: See picture    Neurological:     General: No focal deficit present.  Psychiatric:        Judgment: Judgment normal.     Data Reviewed:  There are no new results to review at this time.  CT ABDOMEN PELVIS W CONTRAST CLINICAL DATA:  Abdomen pain  EXAM: CT ABDOMEN AND PELVIS WITH CONTRAST  TECHNIQUE: Multidetector CT imaging of the abdomen and pelvis was performed using the standard protocol following bolus administration of intravenous contrast.  RADIATION DOSE REDUCTION: This exam was performed according to the departmental dose-optimization program which includes automated exposure control, adjustment of the mA and/or kV according to patient size and/or use of iterative reconstruction technique.  CONTRAST:  OMNIPAQUE  IOHEXOL  350 MG/ML SOLN  COMPARISON:  CT 08/29/2023  FINDINGS: Lower chest: Lung bases demonstrate hazy areas of atelectasis.  Hepatobiliary: Liver appears enlarged measuring 21 cm craniocaudad. Diffuse hepatic steatosis. No calcified gallstone or biliary dilatation  Pancreas: Unremarkable. No pancreatic ductal dilatation or surrounding inflammatory changes.  Spleen: Normal in size without focal abnormality.  Adrenals/Urinary Tract: Adrenal glands are unremarkable. Kidneys are normal, without renal calculi, focal lesion, or  hydronephrosis. Bladder is unremarkable.  Stomach/Bowel: Stomach is within normal limits. No evidence of bowel wall thickening, distention, or inflammatory changes.  Vascular/Lymphatic: Aortic atherosclerosis. No enlarged abdominal or pelvic lymph nodes.  Reproductive: Status post hysterectomy. No adnexal masses.  Other: Negative for pelvic effusion or free air. New skin thickening and soft tissue stranding involving the periumbilical region and infraumbilical pannus. No organized fluid collection.  Musculoskeletal: No acute osseous abnormality.  IMPRESSION: 1. Negative for acute intra-abdominal or pelvic abnormality. 2. Hepatomegaly with hepatic steatosis. 3. New skin thickening and soft tissue stranding involving the periumbilical region and infraumbilical pannus, correlate for cellulitis. No organized fluid collection. 4. Aortic atherosclerosis.  Aortic Atherosclerosis (ICD10-I70.0).  Electronically Signed   By: Esmeralda Hedge M.D.   On: 09/06/2023 21:08  Lab Results  Component Value Date   WBC 15.8 (H) 09/06/2023   HGB 10.9 (L) 09/06/2023   HCT 37.2 09/06/2023   MCV 102.8 (H) 09/06/2023   PLT 264 09/06/2023   Last metabolic panel Lab Results  Component Value Date   GLUCOSE 263 (H) 09/09/2023   NA 139 09/09/2023   K 3.6 09/09/2023   CL 89 (L) 09/09/2023   CO2 33 (H) 09/09/2023   BUN 22 09/09/2023   CREATININE 1.44 (H) 09/09/2023   GFRNONAA 41 (L) 09/09/2023   CALCIUM  8.9 09/09/2023   PHOS 3.6 10/28/2022   PROT 7.3 09/09/2023   ALBUMIN  3.8 09/09/2023   BILITOT 0.5 09/09/2023   ALKPHOS 57 09/09/2023   AST 28 09/09/2023   ALT 37 09/09/2023   ANIONGAP 17 (H) 09/09/2023    Assessment and Plan: * Cellulitis No new or worsening lower abdominal pain over the pannus area and intertrigo folds concerning for cellulitis White  count 16 CT imaging May 29 concerning for cellulitis of the skin tissues of the abdomen Placed on IV Rocephin  and vancomycin  for  infectious coverage Add on topical azole as I do suspect fungal component Wound care consult Monitor   UTI (urinary tract infection) Recurrent dysuria x 1 to 2 weeks Urinalysis indicative of infection IV Rocephin  Urine culture Monitor  Chronic hypoxic respiratory failure (HCC) Muiltifactorial in setting of morbid obesity, OHS, OSA, COPD  Stable on 2-3L [place]  Monitor   Encephalopathy Transient confusion as well as deconditioning Suspect likely multifactorial of with contributions of chronic medical conditions as well as cellulitis and UTI Pending evaluation for SNF placement Treat noted infections Nonfocal exam at present Reassess as appropriate  History of COPD Stable from a resp standpoint  Cont home inhalers   Chronic diastolic CHF (congestive heart failure) (HCC) 2D ECHO 05/2023 w/ EF 50-55% and grade 1 diastolic dysfunction  Appears euvolemic  Cont home regimen    Essential hypertension BP stable  Titrate home regimen    HLD (hyperlipidemia) Cont statin    Controlled type 2 diabetes mellitus without complication, without long-term current use of insulin  (HCC) Blood sugars 100s  SSI  Monitor    OSA (obstructive sleep apnea) CPAP      Advance Care Planning:   Code Status: Full Code   Consults: None   Family Communication: No family at the bedside   Severity of Illness: The appropriate patient status for this patient is OBSERVATION. Observation status is judged to be reasonable and necessary in order to provide the required intensity of service to ensure the patient's safety. The patient's presenting symptoms, physical exam findings, and initial radiographic and laboratory data in the context of their medical condition is felt to place them at decreased risk for further clinical deterioration. Furthermore, it is anticipated that the patient will be medically stable for discharge from the hospital within 2 midnights of admission.   Author: Corrinne Din, MD 09/09/2023 12:48 PM  For on call review [email or web address].

## 2023-09-09 NOTE — Assessment & Plan Note (Signed)
 Recurrent dysuria x 1 to 2 weeks Urinalysis indicative of infection IV Rocephin  Urine culture Monitor

## 2023-09-09 NOTE — Assessment & Plan Note (Signed)
 No new or worsening lower abdominal pain over the pannus area and intertrigo folds concerning for cellulitis White count 16 CT imaging May 29 concerning for cellulitis of the skin tissues of the abdomen Placed on IV Rocephin  and vancomycin  for infectious coverage Add on topical azole as I do suspect fungal component Wound care consult Monitor

## 2023-09-09 NOTE — ED Notes (Addendum)
 Patient removed her pure wick and placed it on table and wet the bed and floor. Patient was assisted with bath and after bath a brief was placed on patient with her own underwear to secure in place. Floor mat is still on floor at bottom of bed, because that is where she wanting to slide too at the end of bed.

## 2023-09-09 NOTE — Assessment & Plan Note (Signed)
 Muiltifactorial in setting of morbid obesity, OHS, OSA, COPD  Stable on 2-3L [place]  Monitor

## 2023-09-09 NOTE — Assessment & Plan Note (Signed)
 Blood sugars 100s  SSI  Monitor

## 2023-09-09 NOTE — Assessment & Plan Note (Signed)
 BP stable Titrate home regimen

## 2023-09-09 NOTE — Assessment & Plan Note (Signed)
 Stable from a resp standpoint  Cont home inhalers

## 2023-09-09 NOTE — Progress Notes (Signed)
 Patient has been very restless since I took over her care at  0200. She will not go to sleep and continues to try to get out of bed although she is unsteady on her feet .  She has to be redirected a lot . She continues to call out family member's names who are not there. She states that she knows she is in the hospital, when asked ,but then goes right back to yelling for her family members who she thinks is here . Dr ordered Benadryl  for patient to help her relax. But the patient refused stating that she is allergic to Benadryl 

## 2023-09-09 NOTE — Assessment & Plan Note (Signed)
 Transient confusion as well as deconditioning Suspect likely multifactorial of with contributions of chronic medical conditions as well as cellulitis and UTI Pending evaluation for SNF placement Treat noted infections Nonfocal exam at present Reassess as appropriate

## 2023-09-09 NOTE — ED Notes (Addendum)
 Patient has been up all night talking and sitting at edge of bed. Patient has been encouraged to get back onto bed and lay down, But refused to lay down and go to sleep. Patient has been calling out different family members names and saying other stuff that do not make since. Floor mats was placed around patient bed.

## 2023-09-09 NOTE — Assessment & Plan Note (Signed)
 WBC 16  Appears chronic though with contributions of cellulitis and UTI  Trend  Monitor

## 2023-09-09 NOTE — ED Notes (Signed)
 Patient sitting at edge of bed, was encouraged not to get up.

## 2023-09-10 DIAGNOSIS — I10 Essential (primary) hypertension: Secondary | ICD-10-CM | POA: Diagnosis not present

## 2023-09-10 DIAGNOSIS — Z86718 Personal history of other venous thrombosis and embolism: Secondary | ICD-10-CM | POA: Diagnosis not present

## 2023-09-10 DIAGNOSIS — M793 Panniculitis, unspecified: Secondary | ICD-10-CM | POA: Diagnosis present

## 2023-09-10 DIAGNOSIS — Z79890 Hormone replacement therapy (postmenopausal): Secondary | ICD-10-CM | POA: Diagnosis not present

## 2023-09-10 DIAGNOSIS — E119 Type 2 diabetes mellitus without complications: Secondary | ICD-10-CM

## 2023-09-10 DIAGNOSIS — L0390 Cellulitis, unspecified: Secondary | ICD-10-CM | POA: Diagnosis present

## 2023-09-10 DIAGNOSIS — E785 Hyperlipidemia, unspecified: Secondary | ICD-10-CM | POA: Diagnosis present

## 2023-09-10 DIAGNOSIS — N390 Urinary tract infection, site not specified: Secondary | ICD-10-CM | POA: Diagnosis present

## 2023-09-10 DIAGNOSIS — I5032 Chronic diastolic (congestive) heart failure: Secondary | ICD-10-CM | POA: Diagnosis present

## 2023-09-10 DIAGNOSIS — G4733 Obstructive sleep apnea (adult) (pediatric): Secondary | ICD-10-CM

## 2023-09-10 DIAGNOSIS — Z6841 Body Mass Index (BMI) 40.0 and over, adult: Secondary | ICD-10-CM | POA: Diagnosis not present

## 2023-09-10 DIAGNOSIS — G9349 Other encephalopathy: Secondary | ICD-10-CM | POA: Diagnosis present

## 2023-09-10 DIAGNOSIS — E11628 Type 2 diabetes mellitus with other skin complications: Secondary | ICD-10-CM | POA: Diagnosis present

## 2023-09-10 DIAGNOSIS — J4489 Other specified chronic obstructive pulmonary disease: Secondary | ICD-10-CM | POA: Diagnosis present

## 2023-09-10 DIAGNOSIS — Z7982 Long term (current) use of aspirin: Secondary | ICD-10-CM | POA: Diagnosis not present

## 2023-09-10 DIAGNOSIS — Z7985 Long-term (current) use of injectable non-insulin antidiabetic drugs: Secondary | ICD-10-CM | POA: Diagnosis not present

## 2023-09-10 DIAGNOSIS — Z85850 Personal history of malignant neoplasm of thyroid: Secondary | ICD-10-CM | POA: Diagnosis not present

## 2023-09-10 DIAGNOSIS — L03311 Cellulitis of abdominal wall: Secondary | ICD-10-CM

## 2023-09-10 DIAGNOSIS — Z7951 Long term (current) use of inhaled steroids: Secondary | ICD-10-CM | POA: Diagnosis not present

## 2023-09-10 DIAGNOSIS — J9611 Chronic respiratory failure with hypoxia: Secondary | ICD-10-CM | POA: Diagnosis present

## 2023-09-10 DIAGNOSIS — Z833 Family history of diabetes mellitus: Secondary | ICD-10-CM | POA: Diagnosis not present

## 2023-09-10 DIAGNOSIS — R627 Adult failure to thrive: Secondary | ICD-10-CM | POA: Diagnosis present

## 2023-09-10 DIAGNOSIS — F319 Bipolar disorder, unspecified: Secondary | ICD-10-CM | POA: Diagnosis present

## 2023-09-10 DIAGNOSIS — K219 Gastro-esophageal reflux disease without esophagitis: Secondary | ICD-10-CM | POA: Diagnosis present

## 2023-09-10 DIAGNOSIS — Z87891 Personal history of nicotine dependence: Secondary | ICD-10-CM | POA: Diagnosis not present

## 2023-09-10 DIAGNOSIS — I110 Hypertensive heart disease with heart failure: Secondary | ICD-10-CM | POA: Diagnosis present

## 2023-09-10 DIAGNOSIS — E662 Morbid (severe) obesity with alveolar hypoventilation: Secondary | ICD-10-CM | POA: Diagnosis present

## 2023-09-10 DIAGNOSIS — I472 Ventricular tachycardia, unspecified: Secondary | ICD-10-CM | POA: Diagnosis not present

## 2023-09-10 DIAGNOSIS — D72829 Elevated white blood cell count, unspecified: Secondary | ICD-10-CM

## 2023-09-10 DIAGNOSIS — Z8709 Personal history of other diseases of the respiratory system: Secondary | ICD-10-CM

## 2023-09-10 LAB — CBC
HCT: 36.8 % (ref 36.0–46.0)
Hemoglobin: 11.7 g/dL — ABNORMAL LOW (ref 12.0–15.0)
MCH: 30.5 pg (ref 26.0–34.0)
MCHC: 31.8 g/dL (ref 30.0–36.0)
MCV: 96.1 fL (ref 80.0–100.0)
Platelets: 255 10*3/uL (ref 150–400)
RBC: 3.83 MIL/uL — ABNORMAL LOW (ref 3.87–5.11)
RDW: 13.3 % (ref 11.5–15.5)
WBC: 17.4 10*3/uL — ABNORMAL HIGH (ref 4.0–10.5)
nRBC: 0 % (ref 0.0–0.2)

## 2023-09-10 LAB — COMPREHENSIVE METABOLIC PANEL WITH GFR
ALT: 28 U/L (ref 0–44)
AST: 18 U/L (ref 15–41)
Albumin: 3.5 g/dL (ref 3.5–5.0)
Alkaline Phosphatase: 50 U/L (ref 38–126)
Anion gap: 14 (ref 5–15)
BUN: 22 mg/dL (ref 8–23)
CO2: 34 mmol/L — ABNORMAL HIGH (ref 22–32)
Calcium: 8.5 mg/dL — ABNORMAL LOW (ref 8.9–10.3)
Chloride: 92 mmol/L — ABNORMAL LOW (ref 98–111)
Creatinine, Ser: 0.95 mg/dL (ref 0.44–1.00)
GFR, Estimated: >60 mL/min (ref >60)
Glucose, Bld: 91 mg/dL (ref 70–99)
Potassium: 3.3 mmol/L — ABNORMAL LOW (ref 3.5–5.1)
Sodium: 140 mmol/L (ref 135–145)
Total Bilirubin: 0.6 mg/dL (ref 0.0–1.2)
Total Protein: 6.8 g/dL (ref 6.5–8.1)

## 2023-09-10 LAB — GLUCOSE, CAPILLARY
Glucose-Capillary: 108 mg/dL — ABNORMAL HIGH (ref 70–99)
Glucose-Capillary: 212 mg/dL — ABNORMAL HIGH (ref 70–99)
Glucose-Capillary: 229 mg/dL — ABNORMAL HIGH (ref 70–99)

## 2023-09-10 MED ORDER — VANCOMYCIN HCL IN DEXTROSE 1-5 GM/200ML-% IV SOLN
1000.0000 mg | Freq: Two times a day (BID) | INTRAVENOUS | Status: DC
  Administered 2023-09-10 – 2023-09-11 (×3): 1000 mg via INTRAVENOUS
  Filled 2023-09-10 (×3): qty 200

## 2023-09-10 MED ORDER — SODIUM CHLORIDE 0.9% FLUSH: 10.0000 mL | INTRAVENOUS | Status: DC | PRN

## 2023-09-10 MED ORDER — GUAIFENESIN-DM 100-10 MG/5ML PO SYRP
5.0000 mL | ORAL_SOLUTION | ORAL | Status: DC | PRN
  Administered 2023-09-10: 10 mL via ORAL
  Filled 2023-09-10: qty 10

## 2023-09-10 MED ORDER — HALOPERIDOL LACTATE 5 MG/ML IJ SOLN
2.0000 mg | Freq: Once | INTRAMUSCULAR | Status: AC | PRN
  Administered 2023-09-11: 2 mg via INTRAVENOUS
  Filled 2023-09-10: qty 1

## 2023-09-10 MED ORDER — SODIUM CHLORIDE 0.9% FLUSH
10.0000 mL | Freq: Two times a day (BID) | INTRAVENOUS | Status: DC
  Administered 2023-09-10: 15 mL
  Administered 2023-09-10: 10 mL
  Administered 2023-09-11: 20 mL

## 2023-09-10 NOTE — Progress Notes (Signed)
 Events this Shift: Patient very confused and hallucinating. Patient speaking to a child that is not in the room and also claiming to see bugs. Patient removed IV that was placed by the IV team. Will continue to monitor this shift.

## 2023-09-10 NOTE — Progress Notes (Addendum)
 IVT consult placed for PIV access. This RN to bedside, attempt x 1 unsuccessful. Pt expressing strong preference for a single attempt and selective regarding site.  Will defer further attempts per pt's request. Primary RN aware and to re-consult IVT when pt is agreeable to further attempts.

## 2023-09-10 NOTE — Progress Notes (Signed)
 Physical Therapy Treatment Patient Details Name: Michele Owens MRN: [id number] DOB: 08-21-1960 Today's Date: 09/10/2023   History of Present Illness 63 y/o female adm 09/06/23 due to diarrhea, weakness, UTI and cellulitis.  PMHx: morbid obesity, arthritis. Asthma, CHF, COPD (on home O2), depression (still dealing with son's tragic death from last year), HTN, gout GERD, DM, bipolar, OSA, tobacco use, HLD, pulmonary edema, thyroid  CA, anasarca.    PT Comments  Pt pleasant, confused and anxious regarding fear of falling.Pt with increased cues to initiate and maxmize movement with decreased carryover and awareness of deficits. Pt able to walk in room with RW with chair follow and perform repeated transfers and HEP. Pt states desire to improve mobility to eventually return home. Pt continues to demonstrate decreased cognition, function and mobility Patient will benefit from continued inpatient follow up therapy, <3 hours/day.      If plan is discharge home, recommend the following: A little help with walking and/or transfers;A little help with bathing/dressing/bathroom;Assistance with cooking/housework;Direct supervision/assist for financial management;Assist for transportation;Direct supervision/assist for medications management   Can travel by private vehicle     Yes  Equipment Recommendations  Rolling walker (2 wheels);BSC/3in1    Recommendations for Other Services       Precautions / Restrictions Precautions Precautions: Fall Recall of Precautions/Restrictions: Impaired     Mobility  Bed Mobility Overal bed mobility: Needs Assistance Bed Mobility: Supine to Sit     Supine to sit: Min assist, HOB elevated, Used rails     General bed mobility comments: HOB 35 degrees with use of rail, increased time, cues to initiate and complete pivot to EOB    Transfers Overall transfer level: Needs assistance   Transfers: Sit to/from Stand Sit to Stand: Min assist            General transfer comment: min assist to rise from bed and CGA to rise from recliner x 5 repeated sit to stand trials with cues for hand placement with pt able to demonstrate appropriate carry over on last trial    Ambulation/Gait Ambulation/Gait assistance: Min assist, +2 safety/equipment Gait Distance (Feet): 15 Feet Assistive device: Rolling walker (2 wheels) Gait Pattern/deviations: Step-through pattern, Decreased stride length, Trunk flexed   Gait velocity interpretation: <1.8 ft/sec, indicate of risk for recurrent falls   General Gait Details: pt fearful of falling in knees buckling with chair closely followed with pt able to static stand at sink to brush teeth during gait. needed mod cues and assist to figure out how to turn and step with RW after standing at sink   Optometrist     Tilt Bed    Modified Rankin (Stroke Patients Only)       Balance Overall balance assessment: Needs assistance Sitting-balance support: Feet supported, No upper extremity supported Sitting balance-Leahy Scale: Fair     Standing balance support: During functional activity, Reliant on assistive device for balance, No upper extremity supported   Standing balance comment: Rw of gait, able to stand at sink to brush teeth without UB support                            Communication Communication Communication: No apparent difficulties  Cognition Arousal: Alert Behavior During Therapy: Anxious   PT - Cognitive impairments: Attention, Initiation, Problem solving, Safety/Judgement  PT - Cognition Comments: slow to initiate movement, sustained attention to mobility tasks, fearful of falling, repeatedly stating staff are out to get her and tell stories, requires redirection to task Following commands: Impaired Following commands impaired: Follows one step commands with increased time    Cueing Cueing Techniques: Verbal  cues, Gestural cues  Exercises General Exercises - Lower Extremity Long Arc Quad: AROM, Both, 20 reps, Seated, AAROM (AAROM on LLE) Hip Flexion/Marching: AAROM, AROM, Right, Left, Seated, 15 reps (AAROM on LLE)    General Comments General comments (skin integrity, edema, etc.): VSS      Pertinent Vitals/Pain Pain Assessment Pain Assessment: Faces Pain Score: 5  Pain Location: knee Pain Descriptors / Indicators: Discomfort, Grimacing Pain Intervention(s): Limited activity within patient's tolerance, Monitored during session, Repositioned    Home Living Family/patient expects to be discharged to:: Private residence Living Arrangements: Alone Available Help at Discharge: Personal care attendant (~2 hours/day) Type of Home: Apartment Home Access: Level entry       Home Layout: One level Home Equipment: Agricultural consultant (2 wheels);Rollator (4 wheels) Additional Comments: has an aide M-F for 2 hours; wears 2L O2 baseline    Prior Function            PT Goals (current goals can now be found in the care plan section) Progress towards PT goals: Progressing toward goals    Frequency    Min 2X/week      PT Plan      Co-evaluation PT/OT/SLP Co-Evaluation/Treatment: Yes Reason for Co-Treatment: Complexity of the patient's impairments (multi-system involvement);Necessary to address cognition/behavior during functional activity;For patient/therapist safety   OT goals addressed during session: ADL's and self-care;Strengthening/ROM      AM-PAC PT "6 Clicks" Mobility   Outcome Measure  Help needed turning from your back to your side while in a flat bed without using bedrails?: A Little Help needed moving from lying on your back to sitting on the side of a flat bed without using bedrails?: A Little Help needed moving to and from a bed to a chair (including a wheelchair)?: A Little Help needed standing up from a chair using your arms (e.g., wheelchair or bedside chair)?: A  Little Help needed to walk in hospital room?: A Lot Help needed climbing 3-5 steps with a railing? : Total 6 Click Score: 15    End of Session Equipment Utilized During Treatment: Gait belt Activity Tolerance: Patient tolerated treatment well Patient left: in chair;with call bell/phone within reach;with chair alarm set Nurse Communication: Mobility status PT Visit Diagnosis: Other abnormalities of gait and mobility (R26.89);Muscle weakness (generalized) (M62.81)     Time: 2841-3244 PT Time Calculation (min) (ACUTE ONLY): 28 min  Charges:    $Gait Training: 8-22 mins PT General Charges $$ ACUTE PT VISIT: 1 Visit                     Annis Baseman, PT Acute Rehabilitation Services Office: [phone]    Sirenia Whitis B Neeya Prigmore 09/10/2023, 11:04 AM

## 2023-09-10 NOTE — Evaluation (Signed)
 Occupational Therapy Evaluation Patient Details Name: Michele Owens MRN: [id number] DOB: Jul 14, 1960 Today's Date: 09/10/2023   History of Present Illness   64 y/o female adm 09/06/23 due to diarrhea, weakness, UTI and cellulitis.  PMHx: morbid obesity, arthritis. Asthma, CHF, COPD (on home O2), depression (still dealing with son's tragic death from last year), HTN, gout GERD, DM, bipolar, OSA, tobacco use, HLD, pulmonary edema, thyroid  CA, anasarca.     Clinical Impressions Pt reports having assist at baseline with ADLs and mobility. Pt lives alone and has PCA 2 hours/day.Pt currently needing up to max A for ADLs, min A for bed mobility and min +2 for transfers with RW. VSS on 3L O2 during session, pt anxious with mobility and fearful of falling. Ambulates household distance and stands at sink for grooming task with close chair follow. Pt presenting with impairments listed below, will follow acutely. Patient will benefit from continued inpatient follow up therapy, <3 hours/day to maximize safety/ind with ADL/functional mobility.      If plan is discharge home, recommend the following:   A little help with walking and/or transfers;A lot of help with bathing/dressing/bathroom;Assistance with cooking/housework;Direct supervision/assist for medications management;Direct supervision/assist for financial management;Assist for transportation;Help with stairs or ramp for entrance     Functional Status Assessment   Patient has had a recent decline in their functional status and demonstrates the ability to make significant improvements in function in a reasonable and predictable amount of time.     Equipment Recommendations   Other (comment) (defer)     Recommendations for Other Services   PT consult     Precautions/Restrictions   Precautions Precautions: Fall Recall of Precautions/Restrictions: Impaired Restrictions Weight Bearing Restrictions Per Provider Order: No      Mobility Bed Mobility Overal bed mobility: Needs Assistance Bed Mobility: Supine to Sit     Supine to sit: Min assist          Transfers Overall transfer level: Needs assistance Equipment used: Rolling walker (2 wheels) Transfers: Sit to/from Stand Sit to Stand: Min assist, +2 safety/equipment, +2 physical assistance                  Balance Overall balance assessment: Needs assistance Sitting-balance support: Feet supported Sitting balance-Leahy Scale: Fair     Standing balance support: During functional activity, Reliant on assistive device for balance Standing balance-Leahy Scale: Poor Standing balance comment: reliant on external support                           ADL either performed or assessed with clinical judgement   ADL Overall ADL's : Needs assistance/impaired Eating/Feeding: Set up;Sitting   Grooming: Oral care;Contact guard assist;Standing   Upper Body Bathing: Moderate assistance;Sitting;Standing   Lower Body Bathing: Maximal assistance;Sitting/lateral leans   Upper Body Dressing : Moderate assistance;Sitting;Standing   Lower Body Dressing: Maximal assistance;Sit to/from stand;Sitting/lateral leans   Toilet Transfer: Minimal assistance;+2 for safety/equipment;Ambulation;Regular Toilet;Rolling walker (2 wheels)   Toileting- Clothing Manipulation and Hygiene: Maximal assistance       Functional mobility during ADLs: Minimal assistance;Rolling walker (2 wheels);+2 for safety/equipment       Vision   Vision Assessment?: No apparent visual deficits     Perception Perception: Not tested       Praxis Praxis: Not tested       Pertinent Vitals/Pain Pain Assessment Pain Assessment: Faces Pain Score: 8  Faces Pain Scale: Hurts whole lot Pain Location: knee Pain Descriptors /  Indicators: Discomfort, Grimacing Pain Intervention(s): Limited activity within patient's tolerance, Monitored during session, Repositioned      Extremity/Trunk Assessment Upper Extremity Assessment Upper Extremity Assessment: Generalized weakness   Lower Extremity Assessment Lower Extremity Assessment: Defer to PT evaluation   Cervical / Trunk Assessment Cervical / Trunk Assessment: Other exceptions Cervical / Trunk Exceptions: body habitus   Communication Communication Communication: No apparent difficulties   Cognition Arousal: Lethargic Behavior During Therapy: Anxious Cognition: Cognition impaired   Orientation impairments: Situation, Time (unaware of hallucinations, disoriented to day) Awareness: Online awareness impaired Memory impairment (select all impairments): Declarative long-term memory, Short-term memory Attention impairment (select first level of impairment): Sustained attention (cues to remain on task) Executive functioning impairment (select all impairments): Problem solving OT - Cognition Comments: pt anxious regarding mobility and fearful of falling                 Following commands: Impaired Following commands impaired: Follows one step commands with increased time     Cueing  General Comments      VSS   Exercises     Shoulder Instructions      Home Living Family/patient expects to be discharged to:: Private residence Living Arrangements: Alone Available Help at Discharge: Personal care attendant (~2 hours/day) Type of Home: Apartment Home Access: Level entry     Home Layout: One level     Bathroom Shower/Tub: Tub/shower unit;Sponge bathes at baseline         Home Equipment: Agricultural consultant (2 wheels);Rollator (4 wheels)   Additional Comments: has an aide M-F for 2 hours; wears 2L O2 baseline      Prior Functioning/Environment Prior Level of Function : Needs assist             Mobility Comments: has been struggling to get up even to use the bathroom lately and family unable to help her up ADLs Comments: aide assists with LB ADL, assist for bathing    OT  Problem List: Decreased strength;Decreased range of motion;Impaired balance (sitting and/or standing);Decreased activity tolerance;Decreased cognition;Decreased safety awareness   OT Treatment/Interventions: Self-care/ADL training;Therapeutic exercise;Energy conservation;DME and/or AE instruction;Therapeutic activities;Patient/family education;Balance training;Cognitive remediation/compensation      OT Goals(Current goals can be found in the care plan section)   Acute Rehab OT Goals Patient Stated Goal: none stated OT Goal Formulation: With patient Time For Goal Achievement: 09/24/23 Potential to Achieve Goals: Good ADL Goals Pt Will Perform Grooming: with supervision;standing Pt Will Perform Upper Body Dressing: with supervision;sitting;standing Pt Will Perform Lower Body Dressing: with min assist;with adaptive equipment;sitting/lateral leans;sit to/from stand Pt Will Transfer to Toilet: with supervision;ambulating;regular height toilet   OT Frequency:  Min 2X/week    Co-evaluation PT/OT/SLP Co-Evaluation/Treatment: Yes Reason for Co-Treatment: Complexity of the patient's impairments (multi-system involvement);Necessary to address cognition/behavior during functional activity;For patient/therapist safety   OT goals addressed during session: ADL's and self-care;Strengthening/ROM      AM-PAC OT "6 Clicks" Daily Activity     Outcome Measure Help from another person eating meals?: A Little Help from another person taking care of personal grooming?: A Little Help from another person toileting, which includes using toliet, bedpan, or urinal?: A Lot Help from another person bathing (including washing, rinsing, drying)?: A Lot Help from another person to put on and taking off regular upper body clothing?: A Lot Help from another person to put on and taking off regular lower body clothing?: A Lot 6 Click Score: 14   End of Session Equipment Utilized During Treatment: Rolling walker (  2  wheels);Gait belt;Oxygen  Nurse Communication: Mobility status  Activity Tolerance: Patient tolerated treatment well Patient left: in chair;with call bell/phone within reach;with chair alarm set  OT Visit Diagnosis: Unsteadiness on feet (R26.81);Other abnormalities of gait and mobility (R26.89);Muscle weakness (generalized) (M62.81)                Time: 4098-1191 OT Time Calculation (min): 20 min Charges:  OT General Charges $OT Visit: 1 Visit OT Evaluation $OT Eval Moderate Complexity: 1 Mod  Lysha Schrade K, OTD, OTR/L SecureChat Preferred Acute Rehab [phone]   Benedict Brain Koonce 09/10/2023, 8:34 AM

## 2023-09-10 NOTE — Progress Notes (Signed)
 Mobility Specialist: Progress Note   09/10/23 1248  Mobility  Activity Transferred to/from [facility]  Level of Assistance Contact guard assist, steadying assist  Assistive Device Front wheel walker  Activity Response Tolerated well  Mobility Referral Yes  Mobility visit 1 Mobility  Mobility Specialist Start Time (ACUTE ONLY) 1151  Mobility Specialist Stop Time (ACUTE ONLY) 1158  Mobility Specialist Time Calculation (min) (ACUTE ONLY) 7 min    Pt received yelling into the hallway for assistance to the [facility]. MinA for STS, CG for stand pivot transfer to [facility]. Left on [facility] with all needs met, call bell in reach.   Deloria Fetch Mobility Specialist Please contact via SecureChat or Rehab office at [phone]

## 2023-09-10 NOTE — Progress Notes (Signed)

## 2023-09-10 NOTE — Progress Notes (Signed)
 [facility]                                                                               Michele Owens, is a 63 y.o. female, DOB - 1960/07/13, WUJ:[id number] Admit date - 09/06/2023    Outpatient Primary MD for the patient is [facility]  LOS - 0  days    Brief summary   Michele Owens is a 63 y.o. female with medical history significant of COPD, chronic respiratory failure, primary hypertension, morbid obesity, OSA, hyperlipidemia, HFpEF, gout, hypothyroidism, bipolar disorder, diabetes mellitus presenting w/ cellulitis/panniculitis, UTI, deconditioning.  Pt initially evaluated around May 29th for issues including deconditioning, failure to thrive at home. There was some concern for patient's safety at home. Has been boarding in ER since then with plan for ultimate SNF placement. . Pt noted to have had issues with thickened skin over pannus area concerning for cellulitis vs. Panniculitis. Pt reports worsening abdominal pain over multiple days. Also w/ dysuria over multiple days with concern for UTI.    Assessment & Plan    Assessment and Plan:    Cellulitis of the abdominal wall.  CT abd and pelvis showing New skin thickening and soft tissue stranding involving the periumbilical region and infraumbilical pannus, correlate for cellulitis. Afebrile, but with leukocytosis.  Recommend to continue with IV antibiotics and monitor for improvement.     UTI (urinary tract infection) Recurrent dysuria x 1 to 2 weeks Urinalysis indicative of infection IV Rocephin  Urine culture Monitor  Chronic hypoxic respiratory failure (HCC) Muiltifactorial in setting of morbid obesity, OHS, OSA, COPD  Stable on 2-3L [place]  Monitor   Acute Encephalopathy Transient confusion as well as deconditioning Suspect likely multifactorial of with contributions of chronic medical conditions as well as cellulitis and UTI This morning she is more alert and interactive.   History  of COPD Stable from a resp standpoint  Cont home inhalers   Chronic diastolic CHF (congestive heart failure) (HCC) 2D ECHO 05/2023 w/ EF 50-55% and grade 1 diastolic dysfunction  Appears euvolemic  Cont home regimen    Essential hypertension Well controlled.    HLD (hyperlipidemia) Cont statin    Controlled type 2 diabetes mellitus without complication, without long-term current use of insulin  (HCC) A1c is 6.8%, continue with SSI.     OSA (obstructive sleep apnea) CPAP   Body mass index is 54.91 kg/m. MORBID OBESITY.    Estimated body mass index is 54.91 kg/m as calculated from the following:   Height as of 06/17/23: 5\' 5"  (1.651 m).   Weight as of this encounter: 149.7 kg.  Code Status: fULL CODE DVT Prophylaxis:  LOVENOX .    Level of Care: Level of care: Telemetry Medical Family Communication: family at bedside.   Disposition Plan:     Remains inpatient appropriate:  pending clinical improvement.   Procedures:  None.   Consultants:   None.   Antimicrobials:   Anti-infectives (From admission, onward)    Start     Dose/Rate Route Frequency Ordered Stop   09/10/23 1115  vancomycin  (VANCOCIN ) IVPB 1000 mg/200 mL premix        1,000 mg 200 mL/hr over  60 Minutes Intravenous 2 times daily 09/10/23 1016     09/10/23 1000  vancomycin  (VANCOREADY) IVPB 1500 mg/300 mL  Status:  Discontinued        1,500 mg 150 mL/hr over 120 Minutes Intravenous Every 24 hours 09/09/23 1012 09/10/23 1016   09/10/23 0900  cefTRIAXone  (ROCEPHIN ) 2 g in sodium chloride  0.9 % 100 mL IVPB  Status:  Discontinued        2 g 200 mL/hr over 30 Minutes Intravenous Every 24 hours 09/09/23 0952 09/09/23 1006   09/09/23 1015  vancomycin  (VANCOREADY) IVPB 1750 mg/350 mL        1,750 mg 175 mL/hr over 120 Minutes Intravenous  Once 09/09/23 1012 09/09/23 1754   09/09/23 1006  cefTRIAXone  (ROCEPHIN ) 2 g in sodium chloride  0.9 % 100 mL IVPB        2 g 200 mL/hr over 30 Minutes Intravenous Every  24 hours 09/09/23 1006     09/09/23 1000  cefTRIAXone  (ROCEPHIN ) 1 g in sodium chloride  0.9 % 100 mL IVPB  Status:  Discontinued        1 g 200 mL/hr over 30 Minutes Intravenous  Once 09/09/23 0952 09/09/23 1006   09/09/23 0845  cefTRIAXone  (ROCEPHIN ) 1 g in sodium chloride  0.9 % 100 mL IVPB  Status:  Discontinued        1 g 200 mL/hr over 30 Minutes Intravenous  Once 09/09/23 0833 09/09/23 1006        Medications  Scheduled Meds:  aspirin  EC  81 mg Oral Daily   budesonide   0.25 mg Nebulization BID   busPIRone   5 mg Oral TID   carvedilol   6.25 mg Oral BID   clotrimazole   Topical BID   dapagliflozin  propanediol  10 mg Oral QAC breakfast   dicyclomine   20 mg Oral BID   diphenhydrAMINE   25 mg Intravenous Once   enoxaparin  (LOVENOX ) injection  70 mg Subcutaneous Q24H   insulin  aspart  0-9 Units Subcutaneous TID WC   levothyroxine   125 mcg Oral QHS   loratadine   10 mg Oral QHS   losartan   25 mg Oral Daily   pantoprazole   40 mg Oral Daily   potassium chloride   10 mEq Oral BID   predniSONE   40 mg Oral Daily   rosuvastatin   10 mg Oral QPM   senna-docusate  1 tablet Oral Daily   sodium chloride  flush  10-40 mL Intracatheter Q12H   spironolactone   25 mg Oral Daily   torsemide   40 mg Oral BID   traZODone   100 mg Oral QHS   umeclidinium-vilanterol  1 puff Inhalation Daily   zolpidem   10 mg Oral QHS   Continuous Infusions:  cefTRIAXone  (ROCEPHIN )  IV 2 g (09/10/23 1306)   vancomycin  1,000 mg (09/10/23 1355)   PRN Meds:.albuterol , guaiFENesin -dextromethorphan , nystatin  cream, ondansetron  **OR** ondansetron  (ZOFRAN ) IV, polyethylene glycol, sodium chloride  flush, traMADol     Subjective:   Michele Owens was seen and examined today.  Alert and oriented, frustrated about personal issues.   Objective:   Vitals:   09/10/23 0800 09/10/23 0835 09/10/23 1226 09/10/23 1700  BP:  130/75 (!) 109/53 (!) 128/100  Pulse:  73 72 77  Resp:  20 18 20   Temp:  98.8 F (37.1 C) 98.7 F  (37.1 C) 98.6 F (37 C)  TempSrc:  Oral Oral Oral  SpO2: 94% 96% 95% 94%  Weight:       No intake or output data in the 24 hours ending 09/10/23 1722  Filed Weights   09/09/23 1100  Weight: (!) 149.7 kg     Exam. General exam: Appears calm and comfortable  Respiratory system: Clear to auscultation. Respiratory effort normal. Cardiovascular system: S1 & S2 heard, RRR. No JVD,  Gastrointestinal system: Abdomen is nondistended, soft and nontender.  Central nervous system: Alert and oriented. No focal neurological deficits. Extremities: warm extremities.  Skin: No rashes,  Psychiatry: Mood & affect appropriate.    Data Reviewed:  I have personally reviewed following labs and imaging studies   CBC Lab Results  Component Value Date   WBC 17.4 (H) 09/10/2023   RBC 3.83 (L) 09/10/2023   HGB 11.7 (L) 09/10/2023   HCT 36.8 09/10/2023   MCV 96.1 09/10/2023   MCH 30.5 09/10/2023   PLT 255 09/10/2023   MCHC 31.8 09/10/2023   RDW 13.3 09/10/2023   LYMPHSABS 1.9 09/06/2023   MONOABS 0.9 09/06/2023   EOSABS 0.3 09/06/2023   BASOSABS 0.1 09/06/2023     Last metabolic panel Lab Results  Component Value Date   NA 140 09/10/2023   K 3.3 (L) 09/10/2023   CL 92 (L) 09/10/2023   CO2 34 (H) 09/10/2023   BUN 22 09/10/2023   CREATININE 0.95 09/10/2023   GLUCOSE 91 09/10/2023   GFRNONAA >60 09/10/2023   GFRAA >60 09/30/2019   CALCIUM  8.5 (L) 09/10/2023   PHOS 3.6 10/28/2022   PROT 6.8 09/10/2023   ALBUMIN  3.5 09/10/2023   BILITOT 0.6 09/10/2023   ALKPHOS 50 09/10/2023   AST 18 09/10/2023   ALT 28 09/10/2023   ANIONGAP 14 09/10/2023    CBG (last 3)  Recent Labs    09/10/23 0832 09/10/23 1257 09/10/23 1713  GLUCAP 108* 212* 229*      Coagulation Profile: No results for input(s): "INR", "PROTIME" in the last 168 hours.   Radiology Studies: No results found.     Feliciana Horn M.D. [facility] 09/10/2023, 5:22 PM  Available via Epic secure chat  7am-7pm After 7 pm, please refer to night coverage provider listed on amion.

## 2023-09-10 NOTE — Progress Notes (Signed)
 [facility] night cross cover note:   I was notified by the patient's RN that existing peripheral IV access has been lost.  The patient was offered placement of a new peripheral IV, although the patient has subsequently refused reestablishment of IV access for now.      Camelia Cavalier, DO Hospitalist

## 2023-09-10 NOTE — Progress Notes (Signed)
 TRH night cross cover note:   I was notified by the patient's RN  of a 15 beat run of nonsustained V. tach, with the patient awake at the time, no new symptoms.  Blood pressure 135/74.  In sitting heart rate is in the 60's .   To further evaluate this relative nonsustained V. tach, I added BMP and magnesium  level to the morning labs.   Additionally, RN conveys that the patient is agitated, pulling at leads and lines, with these behaviors refractory to times redirection.  Given concern that the patient's actions may be of harm to herself, I have added an order for Haldol  2 mg IV x 1 dose prn for agitation.    Camelia Cavalier, DO Hospitalist

## 2023-09-10 NOTE — TOC Progression Note (Addendum)
 Transition of Care [facility]) - Progression Note    Patient Details  Name: Michele Owens MRN: [id number] Date of Birth: 07-27-1960  Transition of Care [facility] CM/SW Contact  Nadeen Shipman A [place], LCSW Phone Number: 09/10/2023, 11:06 AM  Clinical Narrative:     Update 1315. CSW spoke with pt's daughter Odilia Bennett. She said that pt accepted the bed offer to [facility] since [facility] bed unavailable. SW notified family that she is not medically stable for DC today. CSW notified facility, requested update authorization, facility to start.   CSW spoke with pt and pt's family members at bedside. CSW informed them that [facility] does not have bed availability at this time and does have placement at their sister facility, [facility] in [place]. Pt said that she needed to think about it and CSW will return to bedside at another opportune time.    TOC will continue to follow.        Expected Discharge Plan and Services                                               Social Determinants of Health (SDOH) Interventions SDOH Screenings   Food Insecurity: No Food Insecurity (09/09/2023)  Housing: Low Risk  (09/09/2023)  Transportation Needs: No Transportation Needs (09/09/2023)  Utilities: Not At Risk (09/09/2023)  Alcohol Screen: Low Risk  (10/24/2022)  Depression (PHQ2-9): High Risk (02/07/2023)  Financial Resource Strain: Low Risk  (10/24/2022)  Physical Activity: Inactive (02/01/2021)  Social Connections: Unknown (08/21/2021)   Received from [facility]  Stress: Stress Concern Present (02/23/2021)  Tobacco Use: Medium Risk (09/07/2023)    Readmission Risk Interventions    07/09/2022   10:29 AM 05/18/2022   11:29 AM  Readmission Risk Prevention Plan  Transportation Screening Complete Complete  PCP or Specialist Appt within 3-5 Days Complete   HRI or Home Care Consult Complete Complete  Social Work Consult for Recovery Care Planning/Counseling Complete Complete   Palliative Care Screening Not Applicable Complete  Medication Review Oceanographer) Not Complete Referral to Pharmacy

## 2023-09-10 NOTE — Progress Notes (Signed)
 Patient refused IV re stick. On call Provider notified.

## 2023-09-10 NOTE — Progress Notes (Signed)
 Pharmacy Antibiotic Note  Michele Owens is a 63 y.o. female admitted on 09/06/2023 with cellulitis.  Pharmacy has been consulted for vancomycin  dosing.  The patient's renal function improved overnight, no doses given since vancomycin  load yesterday, will empirically adjust for improved renal function  Plan: Vancomycin  1000 mg IV q 12h (eAUC 536) Monitor renal function, Cx and clinical progression to narrow Vancomycin  levels as indicated    Weight: (!) 149.7 kg (330 lb)  Temp (24hrs), Avg:98.3 F (36.8 C), Min:97.9 F (36.6 C), Max:98.8 F (37.1 C)  Recent Labs  Lab 09/06/23 1836 09/09/23 1438 09/10/23 0559  WBC 15.8*  --  17.4*  CREATININE 0.94 1.44* 0.95    Estimated Creatinine Clearance: 90 mL/min (by C-G formula based on SCr of 0.95 mg/dL).    Allergies  Allergen Reactions   Bee Venom Anaphylaxis    "All over my body" (swelling)   Fioricet  [Butalbital -Apap-Caffeine ] Nausea And Vomiting and Rash   Ibuprofen  Rash and Other (See Comments)    Severe rash   Lamisil  [Terbinafine ] Rash and Other (See Comments)    Pt states this causes her to "feel funny"   Nsaids Other (See Comments)    Per MD's orders     Mohammed Andrew, PharmD Clinical Pharmacist 09/10/2023 10:18 AM Please check AMION for all [facility] Pharmacy numbers

## 2023-09-10 NOTE — Progress Notes (Signed)
 Mobility Specialist: Progress Note   09/10/23 1250  Mobility  Activity Transferred to/from [facility]  Level of Assistance Contact guard assist, steadying assist  Assistive Device Front wheel walker  Activity Response Tolerated well  Mobility Referral Yes  Mobility visit 1 Mobility  Mobility Specialist Start Time (ACUTE ONLY) 1217  Mobility Specialist Stop Time (ACUTE ONLY) 1226  Mobility Specialist Time Calculation (min) (ACUTE ONLY) 9 min    Pt received in [facility], requesting assistance back to bed. MinA for STS. MS assisted with pericare in standing. Void and large solid BM successful. CG for stand pivot to bed. Left in bed with all needs met, call bell in reach.   Deloria Fetch Mobility Specialist Please contact via SecureChat or Rehab office at [phone]

## 2023-09-10 NOTE — Plan of Care (Signed)

## 2023-09-10 NOTE — Progress Notes (Signed)
 CSW received call from [place] at [facility] who states the facility cannot accommodate patient but [facility] in [place] can. [place] to speak to Kiva, unit CSW regarding this.  Shepard Dicker, MSW, LCSW Transitions of Care  Clinical Social Worker II [phone]

## 2023-09-11 DIAGNOSIS — I10 Essential (primary) hypertension: Secondary | ICD-10-CM | POA: Diagnosis not present

## 2023-09-11 DIAGNOSIS — L03311 Cellulitis of abdominal wall: Secondary | ICD-10-CM | POA: Diagnosis not present

## 2023-09-11 DIAGNOSIS — G4733 Obstructive sleep apnea (adult) (pediatric): Secondary | ICD-10-CM | POA: Diagnosis not present

## 2023-09-11 DIAGNOSIS — E119 Type 2 diabetes mellitus without complications: Secondary | ICD-10-CM | POA: Diagnosis not present

## 2023-09-11 DIAGNOSIS — E785 Hyperlipidemia, unspecified: Secondary | ICD-10-CM

## 2023-09-11 LAB — BASIC METABOLIC PANEL WITH GFR
Anion gap: 12 (ref 5–15)
BUN: 20 mg/dL (ref 8–23)
CO2: 36 mmol/L — ABNORMAL HIGH (ref 22–32)
Calcium: 8.3 mg/dL — ABNORMAL LOW (ref 8.9–10.3)
Chloride: 92 mmol/L — ABNORMAL LOW (ref 98–111)
Creatinine, Ser: 1.03 mg/dL — ABNORMAL HIGH (ref 0.44–1.00)
GFR, Estimated: >60 mL/min (ref >60)
Glucose, Bld: 108 mg/dL — ABNORMAL HIGH (ref 70–99)
Potassium: 3.5 mmol/L (ref 3.5–5.1)
Sodium: 140 mmol/L (ref 135–145)

## 2023-09-11 LAB — URINE CULTURE: Culture: >=100000 — AB

## 2023-09-11 LAB — GLUCOSE, CAPILLARY
Glucose-Capillary: 98 mg/dL (ref 70–99)
Glucose-Capillary: 150 mg/dL — ABNORMAL HIGH (ref 70–99)
Glucose-Capillary: 217 mg/dL — ABNORMAL HIGH (ref 70–99)

## 2023-09-11 LAB — MAGNESIUM: Magnesium: 2.3 mg/dL (ref 1.7–2.4)

## 2023-09-11 IMAGING — DX DG CHEST 2V
2 series · 2 of 2 positions shown · non-contrast
Comparison: 12/23/2020

CLINICAL DATA: Left side chest pain

EXAM:
CHEST - 2 VIEW

[chest lat]
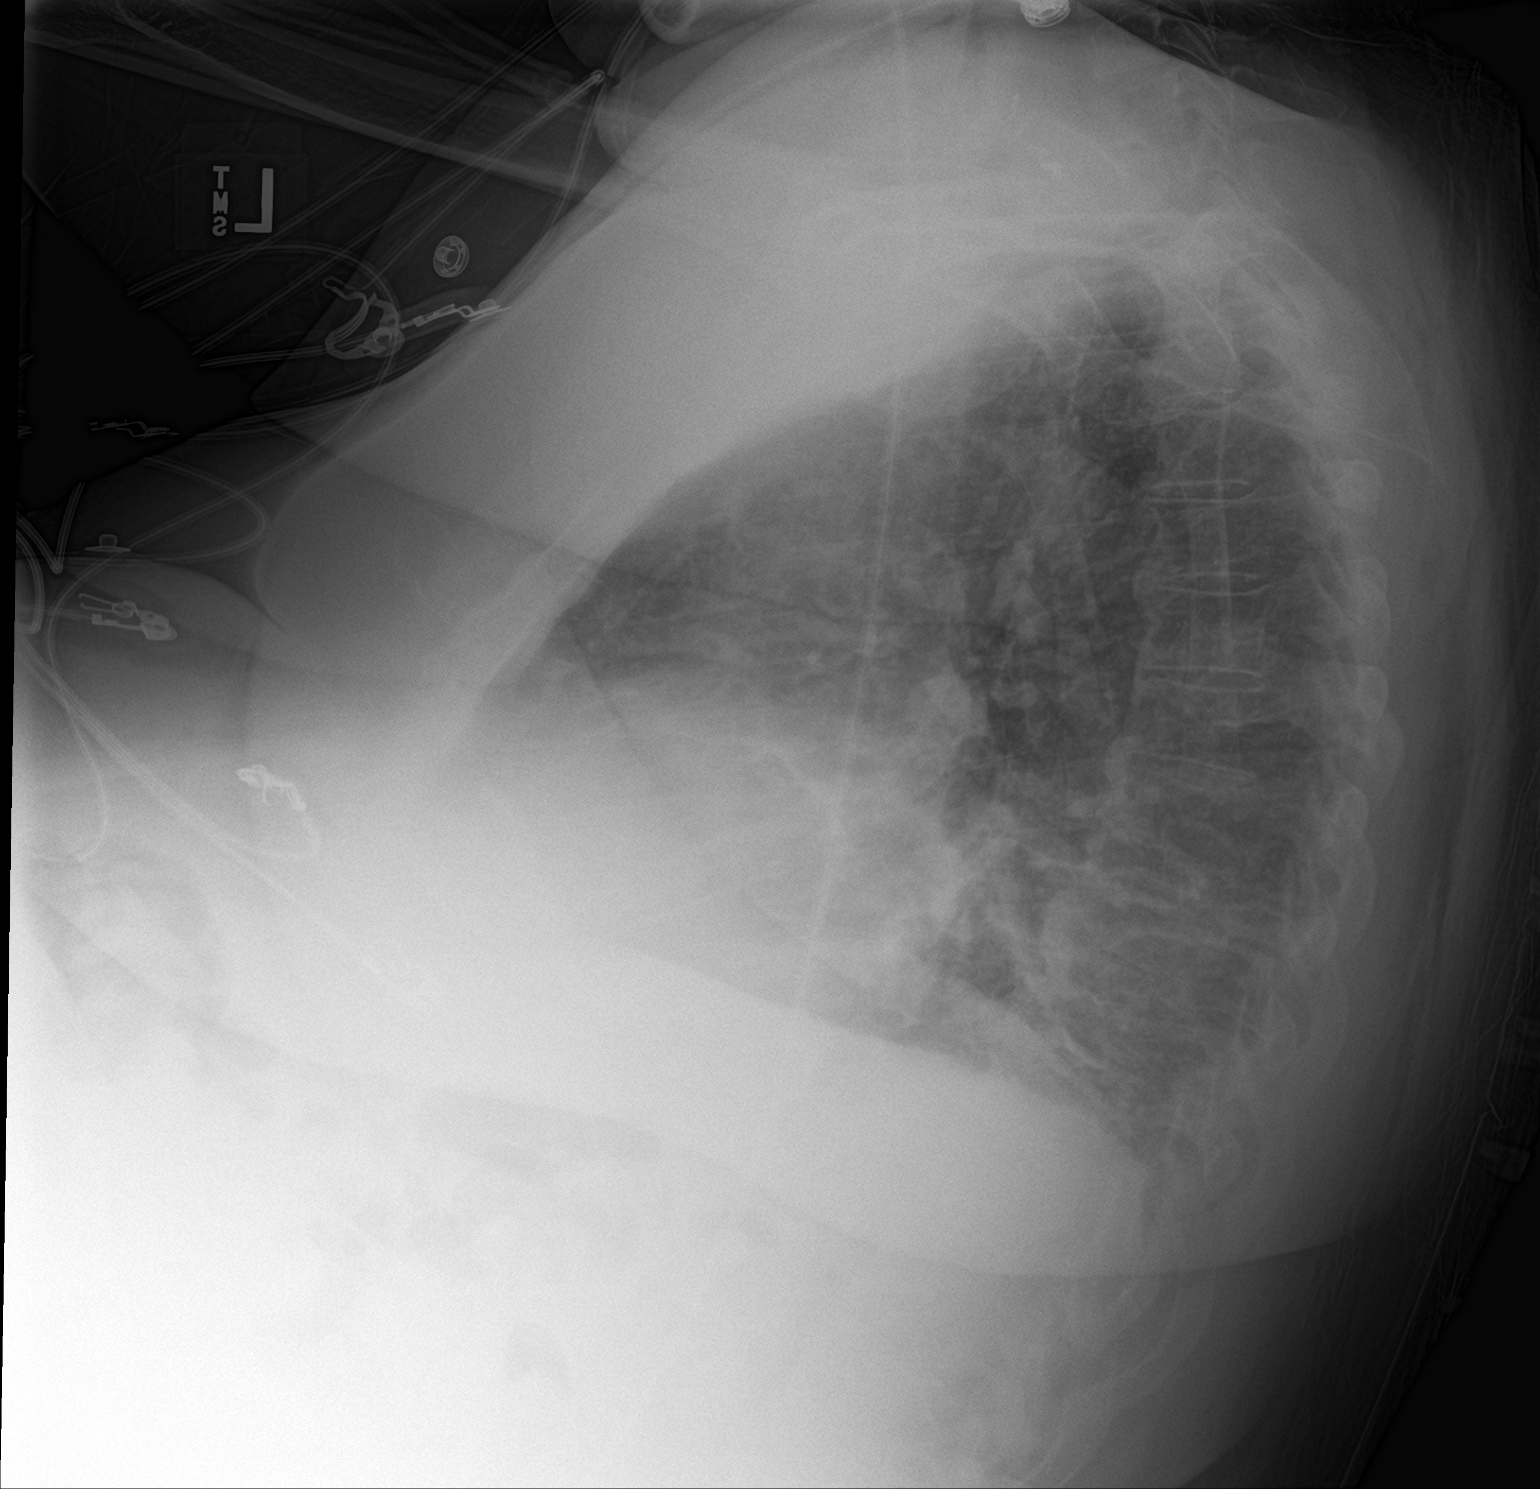

[chest ap]
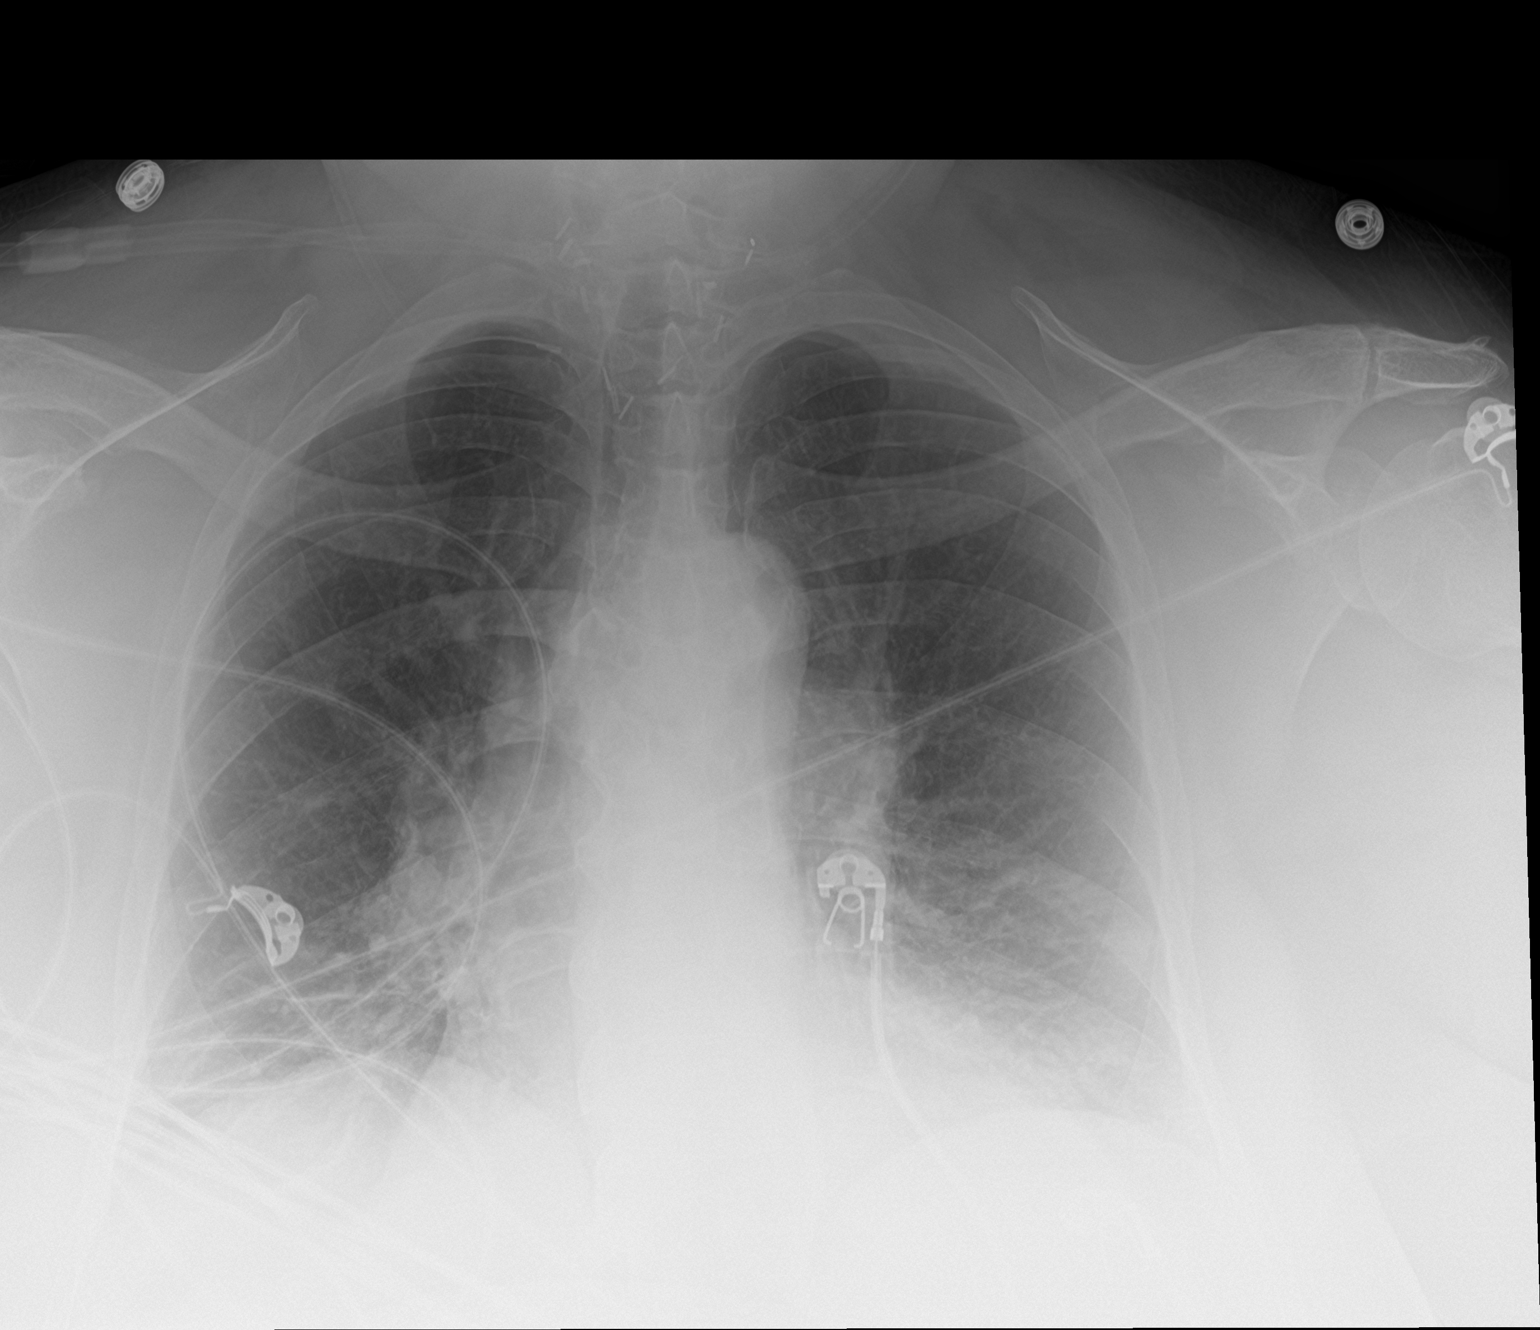

[2 of 2 positions shown; findings below may reference images not displayed]

FINDINGS: Heart is borderline in size. Mild vascular congestion. Bibasilar
opacities are similar prior study. No effusions or acute bony
abnormality.
IMPRESSION: Mild vascular congestion. Bibasilar opacities could reflect edema or
infiltrates.

## 2023-09-11 MED ORDER — CLOTRIMAZOLE 1 % EX CREA: TOPICAL_CREAM | Freq: Two times a day (BID) | CUTANEOUS | 0 refills | Status: DC

## 2023-09-11 MED ORDER — DOXYCYCLINE HYCLATE 100 MG PO TABS
100.0000 mg | ORAL_TABLET | Freq: Two times a day (BID) | ORAL | Status: DC
  Administered 2023-09-11 (×2): 100 mg via ORAL
  Filled 2023-09-11 (×2): qty 1

## 2023-09-11 MED ORDER — AMOXICILLIN-POT CLAVULANATE 875-125 MG PO TABS: 1.0000 | ORAL_TABLET | Freq: Two times a day (BID) | ORAL | 0 refills | Status: AC

## 2023-09-11 MED ORDER — FENOFIBRATE 54 MG PO TABS
54.0000 mg | ORAL_TABLET | Freq: Every day | ORAL | Status: DC
  Administered 2023-09-11: 54 mg via ORAL
  Filled 2023-09-11: qty 1

## 2023-09-11 MED ORDER — VITAMIN D (ERGOCALCIFEROL) 1.25 MG (50000 UNIT) PO CAPS
50000.0000 [IU] | ORAL_CAPSULE | ORAL | Status: AC
  Administered 2023-09-11: 50000 [IU] via ORAL
  Filled 2023-09-11: qty 1

## 2023-09-11 MED ORDER — TRAMADOL HCL 50 MG PO TABS
100.0000 mg | ORAL_TABLET | Freq: Four times a day (QID) | ORAL | Status: DC | PRN
  Administered 2023-09-11: 100 mg via ORAL
  Filled 2023-09-11: qty 2

## 2023-09-11 MED ORDER — CALCITRIOL 0.5 MCG PO CAPS
0.5000 ug | ORAL_CAPSULE | Freq: Every day | ORAL | Status: DC
  Administered 2023-09-11: 0.5 ug via ORAL
  Filled 2023-09-11: qty 1

## 2023-09-11 MED ORDER — FERROUS SULFATE 325 (65 FE) MG PO TABS
325.0000 mg | ORAL_TABLET | Freq: Every day | ORAL | Status: DC
  Administered 2023-09-11: 325 mg via ORAL
  Filled 2023-09-11: qty 1

## 2023-09-11 NOTE — TOC Progression Note (Signed)
 Transition of Care [facility]) - Progression Note    Patient Details  Name: Michele Owens MRN: [id number] Date of Birth: [birth year]  Transition of Care [name]) CM/SW Contact  Zendaya Groseclose A [place], LCSW Phone Number: 09/11/2023, 3:08 PM  Clinical Narrative:     CSW was notified that pt is medically stable for DC. CSW notified facility, bed available today at [facility]. CSW to facilitate DC today.    TOC will continue to follow.         Expected Discharge Plan and Services         Expected Discharge Date: 09/11/23                                     Social Determinants of Health (SDOH) Interventions SDOH Screenings   Food Insecurity: No Food Insecurity (09/09/2023)  Housing: Low Risk  (09/09/2023)  Transportation Needs: No Transportation Needs (09/09/2023)  Utilities: Not At Risk (09/09/2023)  Alcohol Screen: Low Risk  (10/24/2022)  Depression (PHQ2-9): High Risk (02/07/2023)  Financial Resource Strain: Low Risk  (10/24/2022)  Physical Activity: Inactive (02/01/2021)  Social Connections: Unknown (08/21/2021)   Received from [facility], [facility]  Stress: Stress Concern Present (02/23/2021)  Tobacco Use: Medium Risk (09/07/2023)    Readmission Risk Interventions    07/09/2022   10:29 AM 05/18/2022   11:29 AM  Readmission Risk Prevention Plan  Transportation Screening Complete Complete  PCP or Specialist Appt within 3-5 Days Complete   HRI or Home Care Consult Complete Complete  Social Work Consult for Recovery Care Planning/Counseling Complete Complete  Palliative Care Screening Not Applicable Complete  Medication Review Oceanographer) Not Complete Referral to Pharmacy

## 2023-09-11 NOTE — Progress Notes (Signed)
 Report called to [facility]. room [phone]. [phone].

## 2023-09-11 NOTE — Progress Notes (Signed)
 AVS in Packet for transport

## 2023-09-11 NOTE — Discharge Summary (Signed)
 Physician Discharge Summary   Patient: Michele Owens MRN: [id number] DOB: Jan 23, 1961  Admit date:     09/06/2023  Discharge date: 09/11/23  Discharge Physician: Feliciana Horn   PCP: [facility]   Recommendations at discharge:  {Please follow up with PCP in one week.  Please check cbc and bmp in 3 days.   Discharge Diagnoses: Principal Problem:   Cellulitis Active Problems:   Chronic hypoxic respiratory failure (HCC)   UTI (urinary tract infection)   Encephalopathy   OSA (obstructive sleep apnea)   Leukocytosis   Controlled type 2 diabetes mellitus without complication, without long-term current use of insulin  (HCC)   HLD (hyperlipidemia)   Essential hypertension   Chronic diastolic CHF (congestive heart failure) (HCC)   History of COPD    Hospital Course: Michele Owens is a 63 y.o. female with medical history significant of COPD, chronic respiratory failure, primary hypertension, morbid obesity, OSA, hyperlipidemia, HFpEF, gout, hypothyroidism, bipolar disorder, diabetes mellitus presenting w/ cellulitis/panniculitis, UTI, deconditioning.  Pt initially evaluated around May 29th for issues including deconditioning, failure to thrive at home. There was some concern for patient's safety at home. Has been boarding in ER since then with plan for ultimate SNF placement. . Pt noted to have had issues with thickened skin over pannus area concerning for cellulitis vs. Panniculitis. Pt reports worsening abdominal pain over multiple days. Also w/ dysuria over multiple days with concern for UTI. She is being treated with IV antibiotics. Therapy evaluations recommending SNF.   Assessment and Plan:      Cellulitis of the abdominal wall.  CT abd and pelvis showing New skin thickening and soft tissue stranding involving the periumbilical region and infraumbilical pannus. Afebrile, but with leukocytosis.  Completed 2 doses of IV antibiotics. Transition to oral antibiotics  to complete the course.   recommend checking cbc in 2 to 3 days.      UTI (urinary tract infection) Recurrent dysuria x 1 to 2 weeks Urinalysis indicative of infection Transitioned to oral antibiotics to complete the course.    Chronic hypoxic respiratory failure (HCC) Muiltifactorial in setting of morbid obesity, OHS, OSA, COPD  Stable on 2-3L   Monitor    Acute Encephalopathy Transient confusion as well as deconditioning Suspect likely multifactorial of with contributions of chronic medical conditions as well as cellulitis and UTI This morning she is more alert and interactive.    History of COPD Stable from a resp standpoint  Cont home inhalers    Chronic diastolic CHF (congestive heart failure) (HCC) 2D ECHO 05/2023 w/ EF 50-55% and grade 1 diastolic dysfunction  Appears euvolemic  Cont home regimen      Essential hypertension Well controlled.      HLD (hyperlipidemia) Cont statin      Controlled type 2 diabetes mellitus without complication, without long-term current use of insulin  (HCC) A1c is 6.8%, continue with SSI.        OSA (obstructive sleep apnea) CPAP     Body mass index is 54.91 kg/m. MORBID OBESITY.     Estimated body mass index is 54.91 kg/m as calculated from the following:   Height as of 06/17/23: 5\' 5"  (1.651 m).   Weight as of this encounter: 149.7 kg.    Consultants: none.  Procedures performed: none.   Disposition: Skilled nursing facility Diet recommendation:  Discharge Diet Orders (From admission, onward)     Start     Ordered   09/11/23 0000  Diet - low sodium heart healthy  09/11/23 1447           Regular diet DISCHARGE MEDICATION: Allergies as of 09/11/2023       Reactions   Bee Venom Anaphylaxis   "All over my body" (swelling)   Fioricet  [butalbital -apap-caffeine ] Nausea And Vomiting, Rash   Ibuprofen  Rash, Other (See Comments)   Severe rash   Lamisil  [terbinafine ] Rash, Other (See Comments)   Pt states  this causes her to "feel funny"   Nsaids Other (See Comments)   Per MD's orders         Medication List     STOP taking these medications    predniSONE  10 MG tablet Commonly known as: DELTASONE        TAKE these medications    ACETAMINOPHEN  PO Take 1,300-2,050 mg by mouth daily as needed for moderate pain (pain score 4-6) or headache.   albuterol  108 (90 Base) MCG/ACT inhaler Commonly known as: VENTOLIN  HFA Inhale 1-2 puffs into the lungs every 6 (six) hours as needed for wheezing or shortness of breath.   allopurinol  100 MG tablet Commonly known as: ZYLOPRIM  Take 100 mg by mouth daily.   amoxicillin -clavulanate 875-125 MG tablet Commonly known as: AUGMENTIN  Take 1 tablet by mouth 2 (two) times daily for 7 days.   aspirin  EC 81 MG tablet Take 81 mg by mouth daily. Swallow whole.   blood glucose meter kit and supplies Kit Dispense based on patient and insurance preference. Use up to four times daily as directed.   budesonide  0.25 MG/2ML nebulizer solution Commonly known as: PULMICORT  Inhale 2 mLs (0.25 mg total) by nebulization 2 (two) times daily. What changed:  when to take this reasons to take this   busPIRone  5 MG tablet Commonly known as: BUSPAR  Take 1 tablet (5 mg total) by mouth 3 (three) times daily. What changed: how much to take   butalbital -acetaminophen -caffeine  50-325-40 MG tablet Commonly known as: FIORICET  Take 1-2 tablets by mouth every 6 (six) hours as needed for headache.   calcitRIOL  0.5 MCG capsule Commonly known as: ROCALTROL  Take 1 capsule (0.5 mcg total) by mouth daily.   carvedilol  6.25 MG tablet Commonly known as: COREG  Take 1 tablet (6.25 mg total) by mouth 2 (two) times daily.   clotrimazole 1 % cream Commonly known as: LOTRIMIN Apply topically 2 (two) times daily.   dicyclomine  20 MG tablet Commonly known as: BENTYL  Take 1 tablet (20 mg total) by mouth 2 (two) times daily.   doxycycline  100 MG capsule Commonly known  as: VIBRAMYCIN  Take 1 capsule (100 mg total) by mouth 2 (two) times daily.   EPINEPHrine  0.3 mg/0.3 mL Soaj injection Commonly known as: EPI-PEN Inject 0.3 mg into the muscle once as needed for anaphylaxis.   Farxiga  10 MG Tabs tablet Generic drug: dapagliflozin  propanediol Take 1 tablet (10 mg total) by mouth daily before breakfast.   fenofibrate  145 MG tablet Commonly known as: TRICOR  Take 1 tablet (145 mg total) by mouth daily.   FeroSul 325 (65 Fe) MG tablet Generic drug: ferrous sulfate  Take 1 tablet (325 mg total) by mouth daily.   levocetirizine 5 MG tablet Commonly known as: XYZAL  Take 1 tablet (5 mg total) by mouth at bedtime.   levothyroxine  125 MCG tablet Commonly known as: SYNTHROID  Take 125 mcg by mouth at bedtime.   losartan  25 MG tablet Commonly known as: COZAAR  Take 1 tablet (25 mg total) by mouth daily.   nitroGLYCERIN  0.4 MG SL tablet Commonly known as: NITROSTAT  Place 1 tablet (0.4 mg total)  under the tongue every 5 (five) minutes as needed for chest pain.   nystatin  cream Commonly known as: MYCOSTATIN  Apply 1 Application topically 2 (two) times daily as needed for dry skin.   omeprazole  20 MG capsule Commonly known as: PRILOSEC Take 1 capsule (20 mg total) by mouth 2 (two) times daily as needed (acid reflux). What changed:  how much to take when to take this   ondansetron  4 MG disintegrating tablet Commonly known as: ZOFRAN -ODT Take 1 tablet (4 mg total) by mouth every 8 (eight) hours as needed.   OXYGEN  Inhale 2 L into the lungs continuous.   polyethylene glycol 17 g packet Commonly known as: MIRALAX  / GLYCOLAX  Take 17 g by mouth daily as needed for mild constipation.   potassium chloride  10 MEQ tablet Commonly known as: KLOR-CON  Take 1 tablet (10 mEq total) by mouth 2 (two) times daily.   rosuvastatin  10 MG tablet Commonly known as: CRESTOR  Take 1 tablet (10 mg total) by mouth every evening.   senna-docusate 8.6-50 MG  tablet Commonly known as: Senokot-S Take 1 tablet by mouth daily.   spironolactone  25 MG tablet Commonly known as: ALDACTONE  Take 1 tablet (25 mg total) by mouth daily.   torsemide  20 MG tablet Commonly known as: DEMADEX  Take 2 tablets (40 mg total) by mouth 2 (two) times daily.   traZODone  50 MG tablet Commonly known as: DESYREL  Take 2 tablets (100 mg total) by mouth at bedtime.   umeclidinium-vilanterol 62.5-25 MCG/ACT Aepb Commonly known as: ANORO ELLIPTA  Inhale 1 puff into the lungs daily.   Vitamin D (Ergocalciferol) 1.25 MG (50000 UNIT) Caps capsule Commonly known as: DRISDOL Take 50,000 Units by mouth once a week.   zolpidem  10 MG tablet Commonly known as: AMBIEN  Take 10 mg by mouth at bedtime.               Discharge Care Instructions  (From admission, onward)           Start     Ordered   09/11/23 0000  Discharge wound care:       Comments: Cleanse under R breast and pannus/abdominal folds with Vashe wound cleanser Timm Foot [phone]) do not rinse and allow to air dry.  Apply Nystatin  cream as ordered by physician then apply Interdry AG Order Lawson # [phone] Measure and cut length of InterDry to fit in skin folds that have skin breakdown Tuck InterDry fabric into skin folds in a single layer, allow for 2 inches of overhang from skin edges to allow for wicking to occur May remove to bathe; dry area thoroughly and then tuck into affected areas again Do not apply any creams or ointments when using InterDry DO NOT THROW AWAY FOR 5 DAYS unless soiled with stool DO NOT [facility] product, this will inactivate the silver in the material  New sheet of Interdry should be applied after 5 days of use if patient continues to have skin breakdown   09/11/23 1447            Contact information for after-discharge care     Destination     [facility] .   Service: Skilled Nursing Contact information: [address]  [place]  [address] [phone]                    Discharge Exam: Cleavon Curls Weights   09/09/23 1100  Weight: (!) 149.7 kg   General exam: Appears calm and comfortable  Respiratory  system: Clear to auscultation. Respiratory effort normal. Cardiovascular system: S1 & S2 heard, RRR. No JVD,  Gastrointestinal system: Abdomen is nondistended, soft and nontender.  Central nervous system: Alert and oriented. No focal neurological deficits. Extremities: warm extremities.  Skin: abdominal wall pannus.  Psychiatry: restless   Condition at discharge: fair  The results of significant diagnostics from this hospitalization (including imaging, microbiology, ancillary and laboratory) are listed below for reference.   Imaging Studies: CT ABDOMEN PELVIS W CONTRAST Result Date: 09/06/2023 CLINICAL DATA:  Abdomen pain EXAM: CT ABDOMEN AND PELVIS WITH CONTRAST TECHNIQUE: Multidetector CT imaging of the abdomen and pelvis was performed using the standard protocol following bolus administration of intravenous contrast. RADIATION DOSE REDUCTION: This exam was performed according to the departmental dose-optimization program which includes automated exposure control, adjustment of the mA and/or kV according to patient size and/or use of iterative reconstruction technique. CONTRAST:  OMNIPAQUE  IOHEXOL  350 MG/ML SOLN COMPARISON:  CT 08/29/2023 FINDINGS: Lower chest: Lung bases demonstrate hazy areas of atelectasis. Hepatobiliary: Liver appears enlarged measuring 21 cm craniocaudad. Diffuse hepatic steatosis. No calcified gallstone or biliary dilatation Pancreas: Unremarkable. No pancreatic ductal dilatation or surrounding inflammatory changes. Spleen: Normal in size without focal abnormality. Adrenals/Urinary Tract: Adrenal glands are unremarkable. Kidneys are normal, without renal calculi, focal lesion, or hydronephrosis. Bladder is unremarkable. Stomach/Bowel: Stomach is within normal  limits. No evidence of bowel wall thickening, distention, or inflammatory changes. Vascular/Lymphatic: Aortic atherosclerosis. No enlarged abdominal or pelvic lymph nodes. Reproductive: Status post hysterectomy. No adnexal masses. Other: Negative for pelvic effusion or free air. New skin thickening and soft tissue stranding involving the periumbilical region and infraumbilical pannus. No organized fluid collection. Musculoskeletal: No acute osseous abnormality. IMPRESSION: 1. Negative for acute intra-abdominal or pelvic abnormality. 2. Hepatomegaly with hepatic steatosis. 3. New skin thickening and soft tissue stranding involving the periumbilical region and infraumbilical pannus, correlate for cellulitis. No organized fluid collection. 4. Aortic atherosclerosis. Aortic Atherosclerosis (ICD10-I70.0). Electronically Signed   By: Esmeralda Hedge M.D.   On: 09/06/2023 21:08   CT ABDOMEN PELVIS W CONTRAST Result Date: 08/29/2023 CLINICAL DATA:  Acute nonlocalized abdominal pain, right abdominal pain, low back pain EXAM: CT ABDOMEN AND PELVIS WITH CONTRAST TECHNIQUE: Multidetector CT imaging of the abdomen and pelvis was performed using the standard protocol following bolus administration of intravenous contrast. RADIATION DOSE REDUCTION: This exam was performed according to the departmental dose-optimization program which includes automated exposure control, adjustment of the mA and/or kV according to patient size and/or use of iterative reconstruction technique. CONTRAST:  OMNIPAQUE  IOHEXOL  300 MG/ML  SOLN COMPARISON:  06/06/2023 FINDINGS: Lower chest: No acute abnormality. Hepatobiliary: Mild hepatomegaly and hepatic steatosis. No enhancing intrahepatic mass. No intra or extrahepatic biliary ductal dilation. Gallbladder unremarkable. Pancreas: Unremarkable Spleen: Unremarkable Adrenals/Urinary Tract: Adrenal glands are unremarkable. Kidneys are normal, without renal calculi, focal lesion, or hydronephrosis.  Bladder is unremarkable. Stomach/Bowel: Stomach is within normal limits. Appendix appears normal. No evidence of bowel wall thickening, distention, or inflammatory changes. Vascular/Lymphatic: No significant vascular findings are present. No enlarged abdominal or pelvic lymph nodes. Reproductive: Status post hysterectomy. No adnexal masses. Other: No abdominal wall hernia or abnormality. No abdominopelvic ascites. Musculoskeletal: No acute bone abnormality. No lytic or blastic bone lesion. Osseous structures are age appropriate. IMPRESSION: 1. No acute intra-abdominal pathology identified. 2. Mild hepatomegaly and hepatic steatosis. Electronically Signed   By: Worthy Heads M.D.   On: 08/29/2023 02:38    Microbiology: Results for orders placed or performed during the hospital  encounter of 09/06/23  Urine Culture     Status: Abnormal   Collection Time: 09/09/23  8:34 AM   Specimen: Urine, Clean Catch  Result Value Ref Range Status   Specimen Description URINE, CLEAN CATCH  Final   Special Requests   Final    NONE Performed at [facility], [address], [place], [place] [address]    Culture >=100,000 COLONIES/mL ESCHERICHIA COLI (A)  Final   Report Status 09/11/2023 FINAL  Final   Organism ID, Bacteria ESCHERICHIA COLI (A)  Final      Susceptibility   Escherichia coli - MIC*    AMPICILLIN <=2 SENSITIVE Sensitive     CEFAZOLIN  <=4 SENSITIVE Sensitive     CEFEPIME  <=0.12 SENSITIVE Sensitive     CEFTRIAXONE  <=0.25 SENSITIVE Sensitive     CIPROFLOXACIN <=0.25 SENSITIVE Sensitive     GENTAMICIN <=1 SENSITIVE Sensitive     IMIPENEM <=0.25 SENSITIVE Sensitive     NITROFURANTOIN <=16 SENSITIVE Sensitive     TRIMETH/SULFA <=20 SENSITIVE Sensitive     AMPICILLIN/SULBACTAM <=2 SENSITIVE Sensitive     PIP/TAZO <=4 SENSITIVE Sensitive ug/mL    * >=100,000 COLONIES/mL ESCHERICHIA COLI   *Note: Due to a large number of results and/or encounters for the requested time period, some results  have not been displayed. A complete set of results can be found in Results Review.    Labs: CBC: Recent Labs  Lab 09/06/23 1836 09/10/23 0559  WBC 15.8* 17.4*  NEUTROABS 12.7*  --   HGB 10.9* 11.7*  HCT 37.2 36.8  MCV 102.8* 96.1  PLT 264 255   Basic Metabolic Panel: Recent Labs  Lab 09/06/23 1836 09/09/23 1438 09/10/23 0559 09/11/23 0357  NA 147* 139 140 140  K 4.7 3.6 3.3* 3.5  CL 103 89* 92* 92*  CO2 34* 33* 34* 36*  GLUCOSE 108* 263* 91 108*  BUN 16 22 22 20   CREATININE 0.94 1.44* 0.95 1.03*  CALCIUM  8.8* 8.9 8.5* 8.3*  MG  --   --   --  2.3   Liver Function Tests: Recent Labs  Lab 09/06/23 1836 09/09/23 1438 09/10/23 0559  AST 33 28 18  ALT 36 37 28  ALKPHOS 49 57 50  BILITOT 0.9 0.5 0.6  PROT 6.6 7.3 6.8  ALBUMIN  3.5 3.8 3.5   CBG: Recent Labs  Lab 09/10/23 0832 09/10/23 1257 09/10/23 1713 09/11/23 0746 09/11/23 1158  GLUCAP 108* 212* 229* 98 150*    Discharge time spent: 39 minutes.   Signed: Feliciana Horn, MD [facility] 09/11/2023

## 2023-09-11 NOTE — Progress Notes (Signed)
 Mobility Specialist: Progress Note   09/11/23 1327  Mobility  Activity Ambulated with assistance in room  Level of Assistance Contact guard assist, steadying assist  Assistive Device Front wheel walker  Distance Ambulated (ft) 30 ft  Activity Response Tolerated well  Mobility Referral Yes  Mobility visit 1 Mobility  Mobility Specialist Start Time (ACUTE ONLY) 0848  Mobility Specialist Stop Time (ACUTE ONLY) 0913  Mobility Specialist Time Calculation (min) (ACUTE ONLY) 25 min    Pt received in bed, agreeable to mobility session. Very impulsive with mobility. Initially took off [place] for supplemental O2 but agreed to keep it on when SpO2 desat to 85% on RA. SpO2 WFL on 3LO2. Prematurely trying to stand up and transfer to the chair or walk while MS still preparing to ambulate. Also very aggravated and yelling at the beginning of the session about nursing staff but calmed down by the end of session. SV for bed mobility with extra effort. MinA for STS, CG ambulation to the door and back. Left in chair with all needs met, call bell in reach. Chair alarm on.   Deloria Fetch Mobility Specialist Please contact via SecureChat or Rehab office at [phone]

## 2023-09-11 NOTE — Progress Notes (Signed)
 Mobility Specialist: Progress Note   09/11/23 1355  Mobility  Activity Transferred from chair to bed  Level of Assistance Contact guard assist, steadying assist  Assistive Device Front wheel walker  Activity Response Tolerated well  Mobility Referral Yes  Mobility visit 1 Mobility  Mobility Specialist Start Time (ACUTE ONLY) 1340  Mobility Specialist Stop Time (ACUTE ONLY) 1352  Mobility Specialist Time Calculation (min) (ACUTE ONLY) 12 min    Pt received in chair, requesting assistance to get back to bed. CG for stand pivot to bed. Left in bed with all needs met, call bell in reach.   Deloria Fetch Mobility Specialist Please contact via SecureChat or Rehab office at [phone]

## 2023-09-11 NOTE — Plan of Care (Signed)

## 2023-09-11 NOTE — Plan of Care (Signed)
  Problem: Education: Goal: Knowledge of General Education information will improve Description: Including pain rating scale, medication(s)/side effects and non-pharmacologic comfort measures Outcome: Progressing   Problem: Clinical Measurements: Goal: Ability to maintain clinical measurements within normal limits will improve Outcome: Progressing Goal: Will remain free from infection Outcome: Progressing Goal: Cardiovascular complication will be avoided Outcome: Progressing   Problem: Nutrition: Goal: Adequate nutrition will be maintained Outcome: Progressing   Problem: Elimination: Goal: Will not experience complications related to bowel motility Outcome: Progressing Goal: Will not experience complications related to urinary retention Outcome: Progressing   Problem: Safety: Goal: Ability to remain free from injury will improve Outcome: Progressing

## 2023-09-11 NOTE — TOC Progression Note (Signed)
 Transition of Care [facility]) - Progression Note    Patient Details  Name: Michele Owens MRN: [id number] Date of Birth: 11/05/1960  [facility]) CM/SW Contact  Karthikeya Funke A [place], LCSW Phone Number: 09/11/2023, 12:06 PM  Clinical Narrative:     Pt's authorization for [facility] is approved. Pt not yet stable for DC. CSW notified facility and will update once pt's DC date is determined.    TOC will continue to follow.        Expected Discharge Plan and Services                                               Social Determinants of Health (SDOH) Interventions SDOH Screenings   Food Insecurity: No Food Insecurity (09/09/2023)  Housing: Low Risk  (09/09/2023)  Transportation Needs: No Transportation Needs (09/09/2023)  Utilities: Not At Risk (09/09/2023)  Alcohol Screen: Low Risk  (10/24/2022)  Depression (PHQ2-9): High Risk (02/07/2023)  Financial Resource Strain: Low Risk  (10/24/2022)  Physical Activity: Inactive (02/01/2021)  Social Connections: Unknown (08/21/2021)   [facility] from [facility]  Stress: Stress Concern Present (02/23/2021)  Tobacco Use: Medium Risk (09/07/2023)    Readmission Risk Interventions    07/09/2022   10:29 AM 05/18/2022   11:29 AM  Readmission Risk Prevention Plan  Transportation Screening Complete Complete  PCP or Specialist Appt within 3-5 Days Complete   HRI or Home Care Consult Complete Complete  Social Work Consult for Recovery Care Planning/Counseling Complete Complete  Palliative Care Screening Not Applicable Complete  Medication Review Oceanographer) Not Complete Referral to Pharmacy

## 2023-09-11 NOTE — TOC Transition Note (Signed)
 Transition of Care [facility]) - Discharge Note   Patient Details  Name: Michele Owens MRN: [id number] Date of Birth: Jul 23, 1960  Transition of Care [facility]) CM/SW Contact:  Michele Ekstein A [place], Michele Owens Phone Number: 09/11/2023, 3:56 PM   Clinical Narrative:     Patient will DC to: [facility] [place] [facility] [place]  Anticipated DC date: 09/11/23  Family notified: Michele Owens  Transport by: Michele Owens      Per MD patient ready for DC to [facility]. RN, patient, patient's family, and facility notified of DC. Discharge Summary and FL2 sent to facility. RN to call report prior to discharge (room 204, [phone]). DC packet on chart. Ambulance transport requested for patient.     CSW will sign off for now as social work intervention is no longer needed. Please consult us  again if new needs arise.   Final next level of care: Skilled Nursing Facility Barriers to Discharge: Barriers Resolved   Patient Goals and CMS Choice            Discharge Placement              Patient chooses bed at:  [facility]) Patient to be transferred to facility by: [facility] Name of family member notified: Michele Owens, Michele Owens (Daughter) [phone] Patient and family notified of of transfer: 09/11/23  Discharge Plan and Services Additional resources added to the After Visit Summary for                                       Social Drivers of Health (SDOH) Interventions SDOH Screenings   Food Insecurity: No Food Insecurity (09/09/2023)  Housing: Low Risk  (09/09/2023)  Transportation Needs: No Transportation Needs (09/09/2023)  Utilities: Not At Risk (09/09/2023)  Alcohol Screen: Low Risk  (10/24/2022)  Depression (PHQ2-9): High Risk (02/07/2023)  Financial Resource Strain: Low Risk  (10/24/2022)  Physical Activity: Inactive (02/01/2021)  Social Connections: Unknown (08/21/2021)   Received from [facility]  Stress: Stress Concern Present  (02/23/2021)  Tobacco Use: Medium Risk (09/07/2023)     Readmission Risk Interventions    07/09/2022   10:29 AM 05/18/2022   11:29 AM  Readmission Risk Prevention Plan  Transportation Screening Complete Complete  PCP or Specialist Appt within 3-5 Days Complete   HRI or Home Care Consult Complete Complete  Social Work Consult for Recovery Care Planning/Counseling Complete Complete  Palliative Care Screening Not Applicable Complete  Medication Review Oceanographer) Not Complete Referral to Pharmacy

## 2023-09-22 IMAGING — DX DG FOOT COMPLETE 3+V*R*
3 series · 3 of 3 positions shown · non-contrast
Comparison: None.

CLINICAL DATA: Fall, right foot injury

EXAM:
RIGHT FOOT COMPLETE - 3+ VIEW

[x foot ap right]
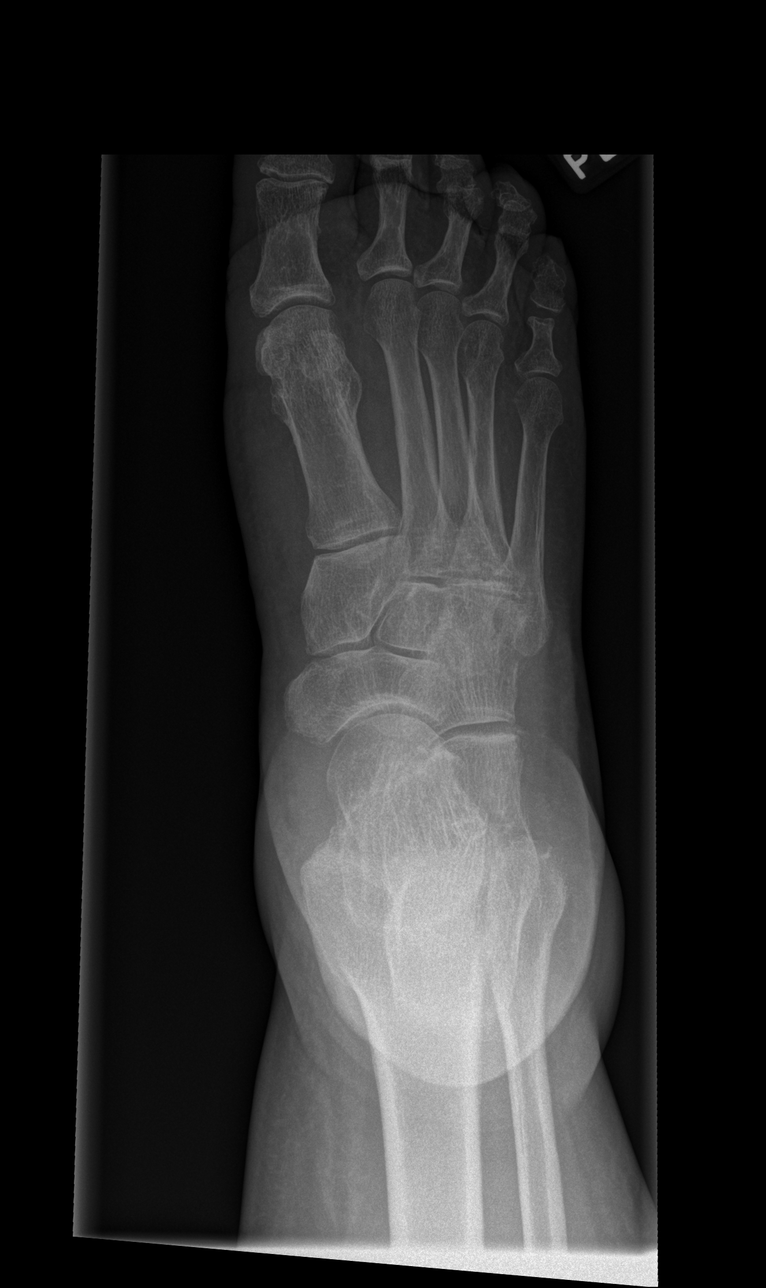

[x foot obl right]
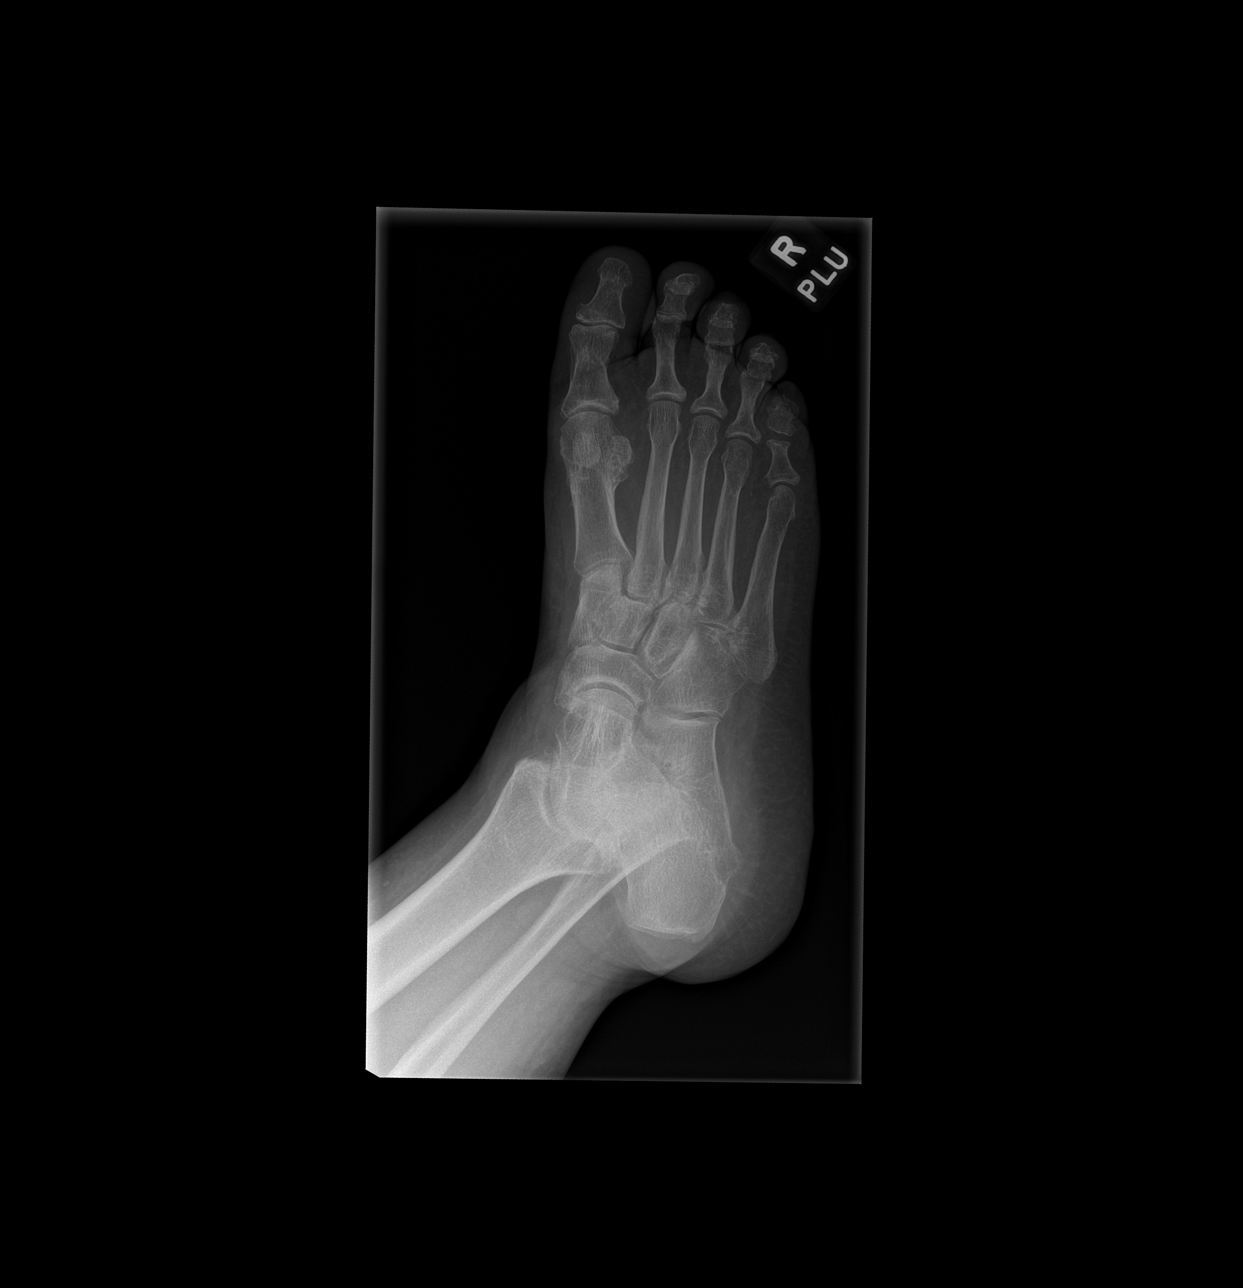

[x foot lat right]
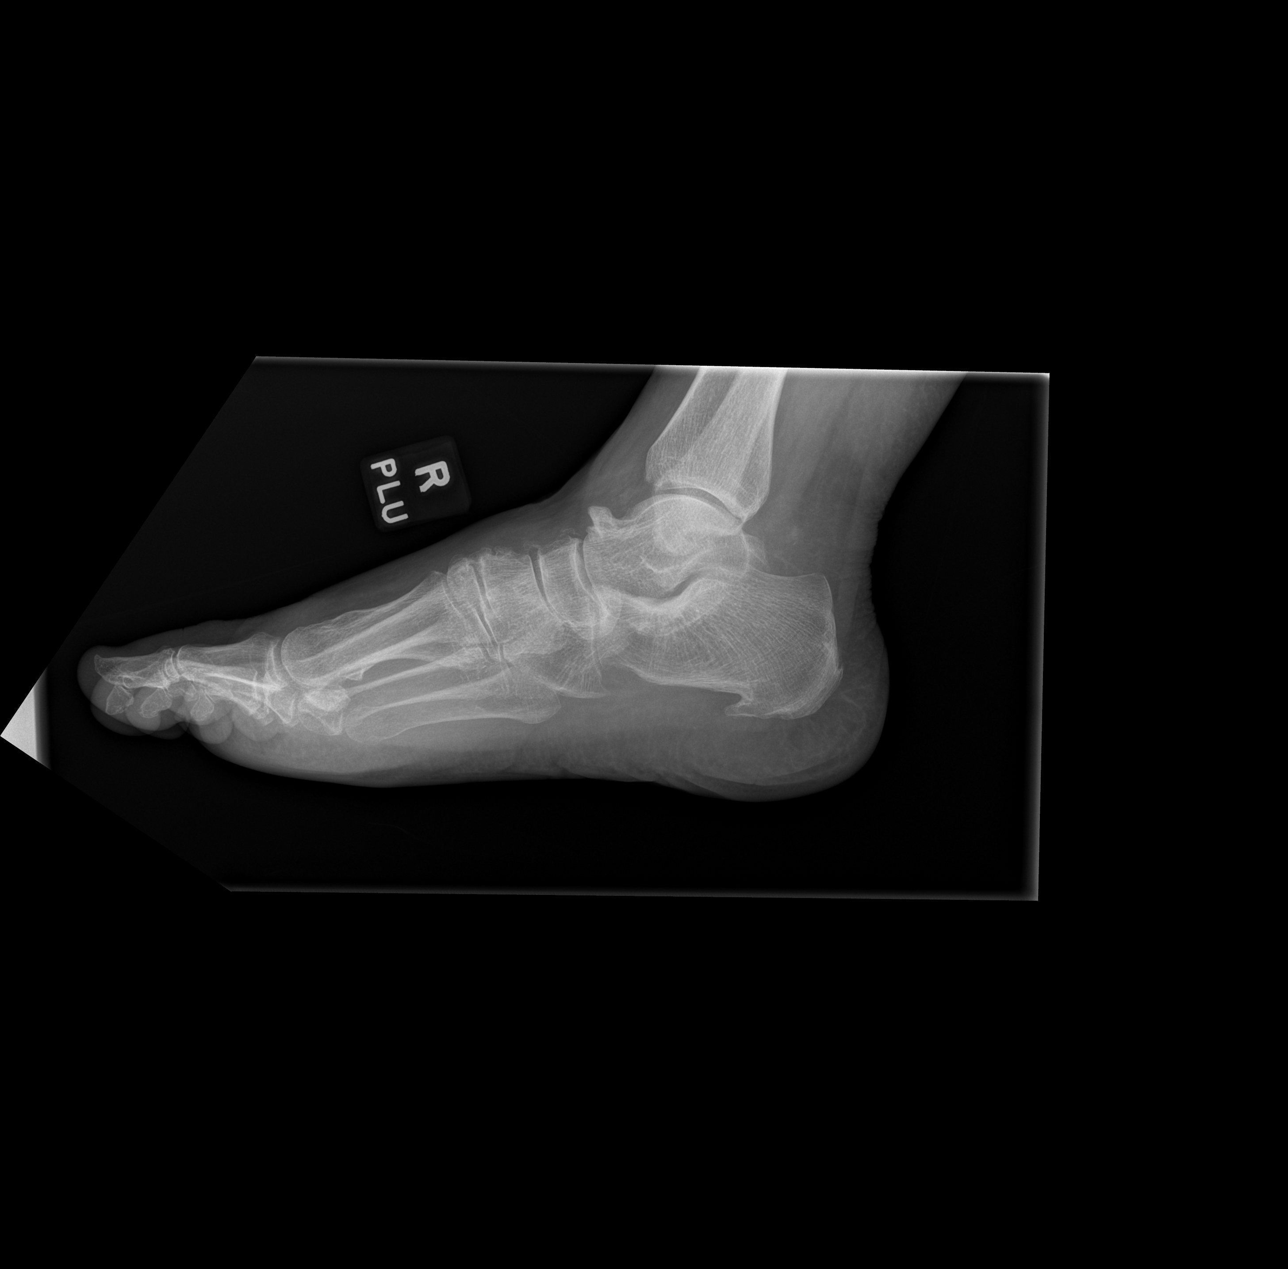

[3 of 3 positions shown; findings below may reference images not displayed]

FINDINGS: Three view radiograph right foot demonstrates a healed fracture of
the first metatarsal in normal alignment. No acute fracture or
dislocation. Mild to moderate midfoot degenerative arthritis. Small
plantar calcaneal spur. No ankle effusion. Soft tissues are
unremarkable.
IMPRESSION: No acute fracture or dislocation.

## 2023-09-22 IMAGING — DX DG ANKLE COMPLETE 3+V*R*
3 series · 3 of 3 positions shown · non-contrast
Comparison: None.

CLINICAL DATA: Fall, right ankle pain

EXAM:
RIGHT ANKLE - COMPLETE 3+ VIEW

[x ankle ap right]
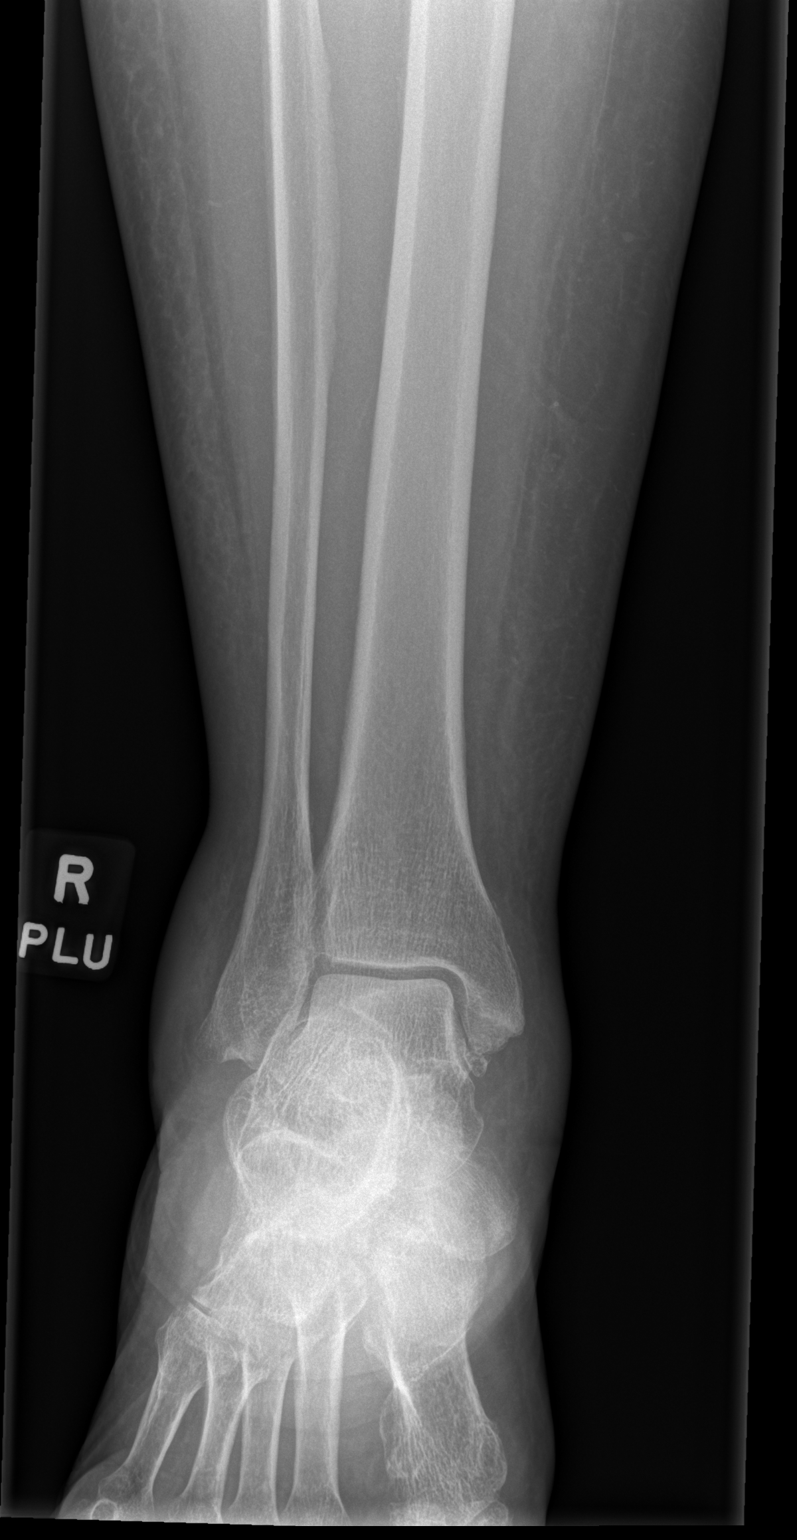

[x ankle obl right]
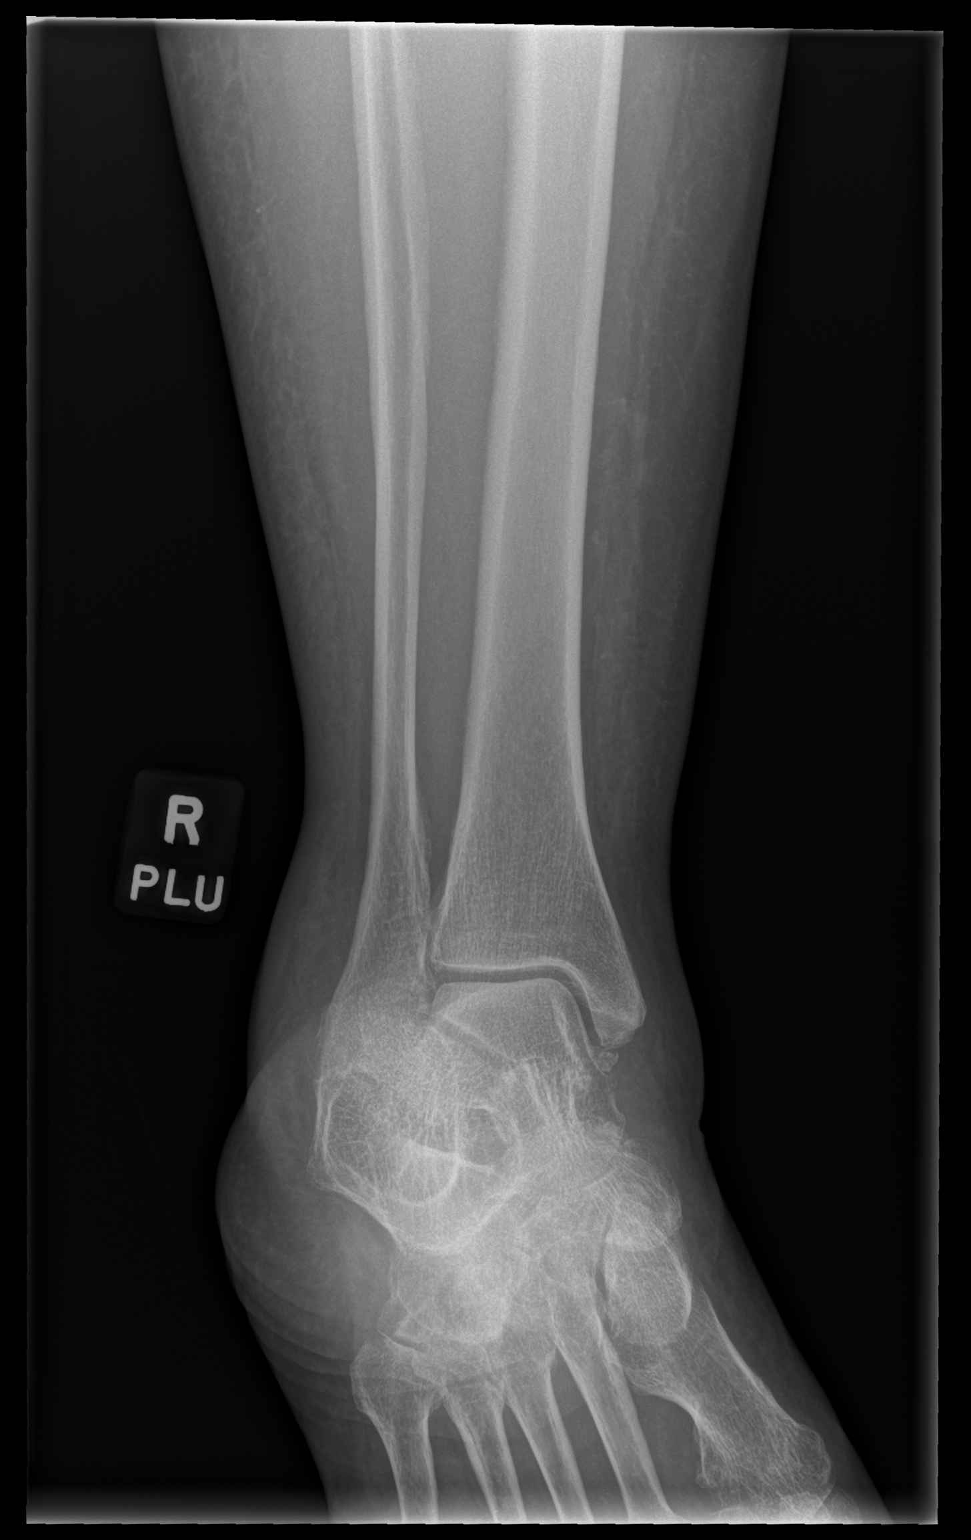

[x ankle lat right]
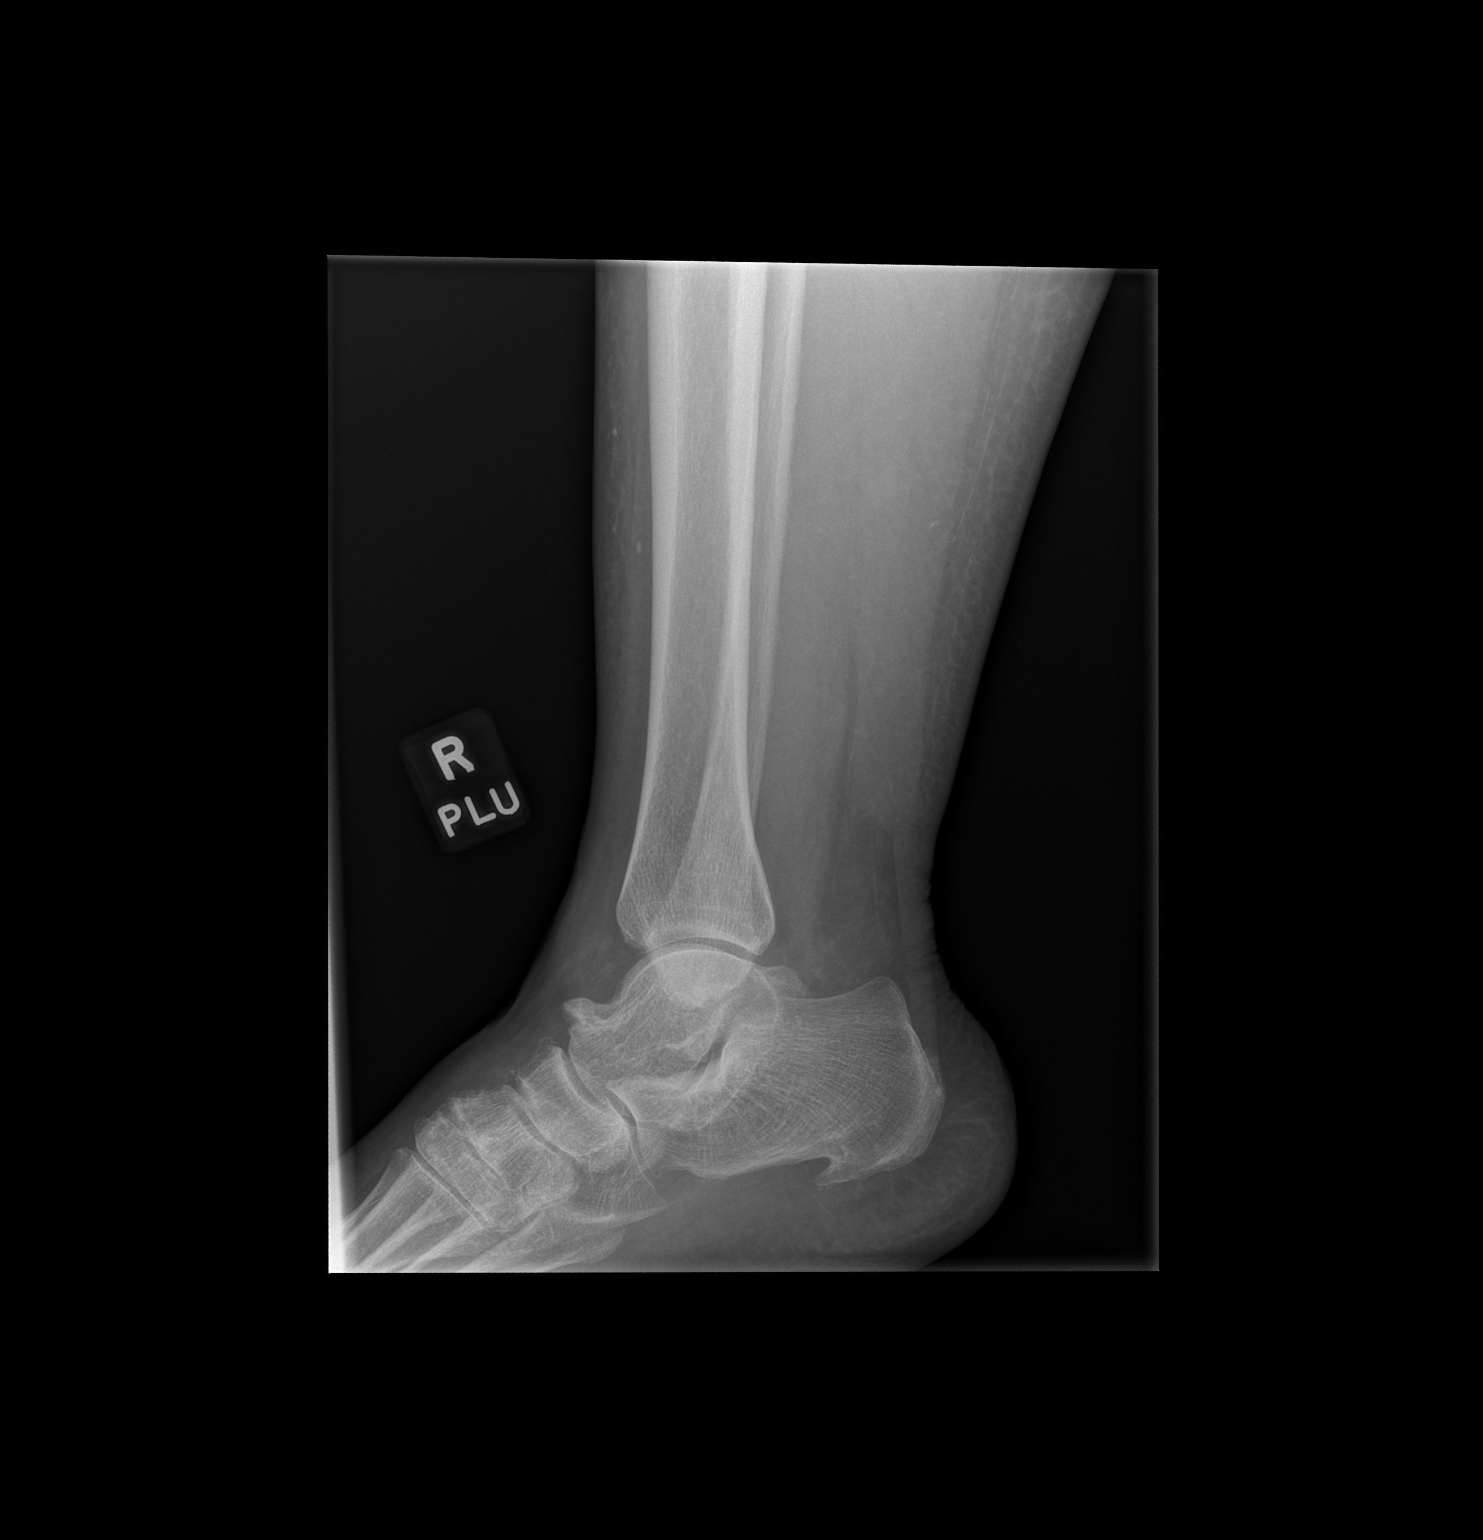

[3 of 3 positions shown; findings below may reference images not displayed]

FINDINGS: Normal alignment. No acute fracture or dislocation. Ankle mortise is
aligned. Corticated density seen inferomedial to the medial
malleolus may represent an accessory ossicle or the sequela of
remote injury. Moderate soft tissue swelling superficial to the
lateral malleolus. No ankle effusion.
IMPRESSION: Lateral soft tissue swelling.  No fracture or dislocation.

## 2023-10-10 ENCOUNTER — Ambulatory Visit: Admitting: Internal Medicine

## 2023-10-19 ENCOUNTER — Encounter (HOSPITAL_COMMUNITY): Payer: Self-pay

## 2023-10-19 ENCOUNTER — Other Ambulatory Visit: Payer: Self-pay

## 2023-10-19 ENCOUNTER — Emergency Department (HOSPITAL_COMMUNITY)

## 2023-10-19 ENCOUNTER — Emergency Department (HOSPITAL_COMMUNITY)
Admission: EM | Admit: 2023-10-19 | Discharge: 2023-10-20 | Disposition: A | Discharge status: Home / Self Care | Attending: Emergency Medicine | Admitting: Emergency Medicine

## 2023-10-19 DIAGNOSIS — R0602 Shortness of breath: Secondary | ICD-10-CM | POA: Insufficient documentation

## 2023-10-19 DIAGNOSIS — M25562 Pain in left knee: Secondary | ICD-10-CM

## 2023-10-19 DIAGNOSIS — E039 Hypothyroidism, unspecified: Secondary | ICD-10-CM | POA: Diagnosis not present

## 2023-10-19 DIAGNOSIS — M170 Bilateral primary osteoarthritis of knee: Secondary | ICD-10-CM

## 2023-10-19 DIAGNOSIS — Z79899 Other long term (current) drug therapy: Secondary | ICD-10-CM | POA: Insufficient documentation

## 2023-10-19 DIAGNOSIS — E119 Type 2 diabetes mellitus without complications: Secondary | ICD-10-CM | POA: Diagnosis not present

## 2023-10-19 DIAGNOSIS — M1712 Unilateral primary osteoarthritis, left knee: Secondary | ICD-10-CM | POA: Insufficient documentation

## 2023-10-19 DIAGNOSIS — Z7982 Long term (current) use of aspirin: Secondary | ICD-10-CM | POA: Diagnosis not present

## 2023-10-19 DIAGNOSIS — M545 Low back pain, unspecified: Secondary | ICD-10-CM | POA: Diagnosis not present

## 2023-10-19 DIAGNOSIS — Z7984 Long term (current) use of oral hypoglycemic drugs: Secondary | ICD-10-CM | POA: Diagnosis not present

## 2023-10-19 DIAGNOSIS — M1711 Unilateral primary osteoarthritis, right knee: Secondary | ICD-10-CM | POA: Insufficient documentation

## 2023-10-19 DIAGNOSIS — R600 Localized edema: Secondary | ICD-10-CM | POA: Diagnosis not present

## 2023-10-19 DIAGNOSIS — M25462 Effusion, left knee: Secondary | ICD-10-CM | POA: Diagnosis not present

## 2023-10-19 LAB — CBC WITH DIFFERENTIAL/PLATELET
Abs Immature Granulocytes: 0.12 K/uL — ABNORMAL HIGH (ref 0.00–0.07)
Basophils Absolute: 0.1 K/uL (ref 0.0–0.1)
Basophils Relative: 0 %
Eosinophils Absolute: 0.2 K/uL (ref 0.0–0.5)
Eosinophils Relative: 1 %
HCT: 39.5 % (ref 36.0–46.0)
Hemoglobin: 11.9 g/dL — ABNORMAL LOW (ref 12.0–15.0)
Immature Granulocytes: 1 %
Lymphocytes Relative: 16 %
Lymphs Abs: 2.7 K/uL (ref 0.7–4.0)
MCH: 30.3 pg (ref 26.0–34.0)
MCHC: 30.1 g/dL (ref 30.0–36.0)
MCV: 100.5 fL — ABNORMAL HIGH (ref 80.0–100.0)
Monocytes Absolute: 1 K/uL (ref 0.1–1.0)
Monocytes Relative: 6 %
Neutro Abs: 12.8 K/uL — ABNORMAL HIGH (ref 1.7–7.7)
Neutrophils Relative %: 76 %
Platelets: 306 K/uL (ref 150–400)
RBC: 3.93 MIL/uL (ref 3.87–5.11)
RDW: 12.7 % (ref 11.5–15.5)
WBC: 16.9 K/uL — ABNORMAL HIGH (ref 4.0–10.5)
nRBC: 0 % (ref 0.0–0.2)

## 2023-10-19 LAB — COMPREHENSIVE METABOLIC PANEL WITH GFR
ALT: 13 U/L (ref 0–44)
AST: 18 U/L (ref 15–41)
Albumin: 3.9 g/dL (ref 3.5–5.0)
Alkaline Phosphatase: 55 U/L (ref 38–126)
Anion gap: 12 (ref 5–15)
BUN: 17 mg/dL (ref 8–23)
CO2: 32 mmol/L (ref 22–32)
Calcium: 8.3 mg/dL — ABNORMAL LOW (ref 8.9–10.3)
Chloride: 93 mmol/L — ABNORMAL LOW (ref 98–111)
Creatinine, Ser: 1 mg/dL (ref 0.44–1.00)
GFR, Estimated: >60 mL/min
Glucose, Bld: 130 mg/dL — ABNORMAL HIGH (ref 70–99)
Potassium: 4.1 mmol/L (ref 3.5–5.1)
Sodium: 137 mmol/L (ref 135–145)
Total Bilirubin: 0.3 mg/dL (ref 0.0–1.2)
Total Protein: 6.9 g/dL (ref 6.5–8.1)

## 2023-10-19 LAB — URINALYSIS, ROUTINE W REFLEX MICROSCOPIC
Bacteria, UA: NONE SEEN
Bilirubin Urine: NEGATIVE
Glucose, UA: >=500 mg/dL — AB
Hgb urine dipstick: NEGATIVE
Ketones, ur: NEGATIVE mg/dL
Leukocytes,Ua: NEGATIVE
Nitrite: NEGATIVE
Protein, ur: NEGATIVE mg/dL
Specific Gravity, Urine: 1.006 (ref 1.005–1.030)
pH: 5 (ref 5.0–8.0)

## 2023-10-19 LAB — BRAIN NATRIURETIC PEPTIDE: B Natriuretic Peptide: 31.7 pg/mL (ref 0.0–100.0)

## 2023-10-19 LAB — MAGNESIUM: Magnesium: 2.1 mg/dL (ref 1.7–2.4)

## 2023-10-19 MED ORDER — OXYCODONE-ACETAMINOPHEN 5-325 MG PO TABS: 1.0000 | ORAL_TABLET | Freq: Four times a day (QID) | ORAL | 0 refills | Status: DC | PRN

## 2023-10-19 MED ORDER — OXYCODONE-ACETAMINOPHEN 5-325 MG PO TABS
2.0000 | ORAL_TABLET | Freq: Once | ORAL | Status: AC
  Administered 2023-10-19: 2 via ORAL
  Filled 2023-10-19: qty 2

## 2023-10-19 MED ORDER — AMOXICILLIN-POT CLAVULANATE 875-125 MG PO TABS
1.0000 | ORAL_TABLET | Freq: Two times a day (BID) | ORAL | 0 refills | Status: DC
Start: 2023-10-19 — End: 2023-12-05

## 2023-10-19 MED ORDER — COLCHICINE 0.6 MG PO TABS: 0.6000 mg | ORAL_TABLET | Freq: Two times a day (BID) | ORAL | 0 refills | Status: DC

## 2023-10-19 MED ORDER — AMOXICILLIN-POT CLAVULANATE 875-125 MG PO TABS
1.0000 | ORAL_TABLET | Freq: Once | ORAL | Status: AC
  Administered 2023-10-19: 1 via ORAL
  Filled 2023-10-19: qty 1

## 2023-10-19 MED ORDER — COLCHICINE 0.6 MG PO TABS
1.2000 mg | ORAL_TABLET | ORAL | Status: AC
  Administered 2023-10-19: 1.2 mg via ORAL
  Filled 2023-10-19: qty 2

## 2023-10-19 NOTE — ED Provider Notes (Signed)
 [place] EMERGENCY DEPARTMENT AT [facility] Provider Note   CSN: [id number] Arrival date & time: 10/19/23  1621     Patient presents with: Leg Pain   Michele Owens is a 63 y.o. female.    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  She is also on diuretics for chronic edema of her legs.  The patient had an echocardiogram last performed approximately 1-1/2 years ago, this showed that she had an ejection fraction of 55 to 60%, grade 1 diastolic dysfunction  Right heart catheterization performed in 2021 showed mildly elevated right atrial pressures and mild pulmonary venous hypertension.  She presents today complaining of leg pain on the left which is caused her to have some occasional weakness feeling like her leg is getting give out.  She called the paramedics because of increasing pain.  She reports a history of gout and this feels similar to that.  She has not had fevers chills or redness overlying the skin.  She also complains of having some lower back pain, she states that it is in her lower back in the midline, does not radiate down her legs and is not associated with numbness or weakness of the legs.  She has not yet seen an orthopedist but is been told by her family doctor that she needs to lose weight prior to having the surgery, she reports she is lost about 15 pounds so far.  She has 1 living child, she states that her son was shot and killed a couple of years ago, she has been seeing a therapist because of the depression associated with this.  Her daughter is still living, actually  She only complains of SOB  on exertion(baseline), not at rest - she is on 2 L chronically.  She has assistive devices for ambulation at home.    Prior to Admission medications   Medication Sig Start Date End Date Taking? Authorizing Provider  amoxicillin -clavulanate (AUGMENTIN ) 875-125 MG tablet Take 1 tablet by mouth every 12 (twelve) hours. 10/19/23  Yes Cleotilde Rogue, MD  colchicine  0.6 MG tablet Take 1 tablet (0.6 mg total) by mouth 2 (two) times daily for 5 days. 10/19/23 10/24/23 Yes Cleotilde Rogue, MD  oxyCODONE -acetaminophen  (PERCOCET/ROXICET) 5-325 MG tablet Take 1 tablet by mouth every 6 (six) hours as needed for severe pain (pain score 7-10). 10/19/23  Yes Cleotilde Rogue, MD  ACETAMINOPHEN  PO Take 1,300-2,050 mg by mouth daily as needed for moderate pain (pain score 4-6) or headache.    [provider]  albuterol  (VENTOLIN  HFA) 108 (90 Base) MCG/ACT inhaler Inhale 1-2 puffs into the lungs every 6 (six) hours as needed for wheezing or shortness of breath. 11/24/22   Rolan Ezra RAMAN, MD  allopurinol  (ZYLOPRIM ) 100 MG tablet Take 100 mg by mouth daily.    [provider]  aspirin  EC 81 MG tablet Take 81 mg by mouth daily. Swallow whole.    [provider]  blood glucose meter kit and supplies KIT Dispense based on patient and insurance preference. Use up to four times daily as directed. 12/25/20  Lue Elsie BROCKS, MD  budesonide  (PULMICORT ) 0.25 MG/2ML nebulizer solution Inhale 2 mLs (0.25 mg total) by nebulization 2 (two) times daily. Patient taking differently: Take 0.25 mg by nebulization daily as needed (for shortness of breath or wheezing). 11/24/22   Rolan Ezra RAMAN, MD  busPIRone  (BUSPAR ) 5 MG tablet Take 1 tablet (5 mg total) by mouth 3 (three) times daily. Patient taking differently: Take 10 mg by mouth 3 (three) times daily. 02/07/23   Lendia Boby CROME, NP-C  butalbital -acetaminophen -caffeine  (FIORICET ) 50-325-40 MG tablet Take 1-2 tablets by mouth every 6 (six) hours as needed for  headache. 05/08/23 05/07/24  Victor Lynwood DASEN, PA-C  calcitRIOL  (ROCALTROL ) 0.5 MCG capsule Take 1 capsule (0.5 mcg total) by mouth daily. 09/26/16   Hongalgi, Anand D, MD  carvedilol  (COREG ) 6.25 MG tablet Take 1 tablet (6.25 mg total) by mouth 2 (two) times daily. 05/23/23   Milford, Harlene HERO, FNP  clotrimazole  (LOTRIMIN ) 1 % cream Apply topically 2 (two) times daily. 09/11/23   Akula, Vijaya, MD  dicyclomine  (BENTYL ) 20 MG tablet Take 1 tablet (20 mg total) by mouth 2 (two) times daily. 08/29/23   Henderly, Britni A, PA-C  doxycycline  (VIBRAMYCIN ) 100 MG capsule Take 1 capsule (100 mg total) by mouth 2 (two) times daily. 06/11/23   Charlyn Sora, MD  EPINEPHrine  0.3 mg/0.3 mL IJ SOAJ injection Inject 0.3 mg into the muscle once as needed for anaphylaxis.    [provider]  FARXIGA  10 MG TABS tablet Take 1 tablet (10 mg total) by mouth daily before breakfast. 02/02/23   Rolan Ezra RAMAN, MD  fenofibrate  (TRICOR ) 145 MG tablet Take 1 tablet (145 mg total) by mouth daily. 02/02/23   Rolan Ezra RAMAN, MD  FEROSUL 325 (65 Fe) MG tablet Take 1 tablet (325 mg total) by mouth daily. 02/09/23   Henson, Vickie L, NP-C  levocetirizine (XYZAL ) 5 MG tablet Take 1 tablet (5 mg total) by mouth at bedtime. 11/24/22   Rolan Ezra RAMAN, MD  levothyroxine  (SYNTHROID ) 125 MCG tablet Take 125 mcg by mouth at bedtime. 05/07/23   [provider]  losartan  (COZAAR ) 25 MG tablet Take 1 tablet (25 mg total) by mouth daily. 02/02/23   Rolan Ezra RAMAN, MD  nitroGLYCERIN  (NITROSTAT ) 0.4 MG SL tablet Place 1 tablet (0.4 mg total) under the tongue every 5 (five) minutes as needed for chest pain. 05/23/23   Glena Harlene HERO, FNP  nystatin  cream (MYCOSTATIN ) Apply 1 Application topically 2 (two) times daily as needed for dry skin. 09/26/22   [provider]  omeprazole  (PRILOSEC) 20 MG capsule Take 1 capsule (20 mg total) by mouth 2 (two) times daily as needed (acid reflux). Patient taking differently: Take 40  mg by mouth daily. 11/24/22   Rolan Ezra RAMAN, MD  ondansetron  (ZOFRAN -ODT) 4 MG disintegrating tablet Take 1 tablet (4 mg total) by mouth every 8 (eight) hours as needed. 08/29/23   Henderly, Britni A, PA-C  OXYGEN  Inhale 2 L into the lungs continuous.    [provider]  polyethylene glycol (MIRALAX  / GLYCOLAX ) 17 g packet Take 17 g by mouth daily as needed for mild constipation. 11/24/22   Rolan Ezra RAMAN, MD  potassium chloride  (KLOR-CON ) 10 MEQ tablet Take 1 tablet (10 mEq total) by mouth 2 (two) times daily. 07/17/23   Rolan Ezra RAMAN, MD  rosuvastatin  (CRESTOR ) 10 MG tablet Take 1 tablet (10 mg total) by mouth every evening. 02/02/23   Rolan Ezra RAMAN, MD  senna-docusate (SENOKOT-S) 8.6-50  MG tablet Take 1 tablet by mouth daily. 02/07/23   Henson, Vickie L, NP-C  spironolactone  (ALDACTONE ) 25 MG tablet Take 1 tablet (25 mg total) by mouth daily. 02/02/23   Rolan Ezra RAMAN, MD  torsemide  (DEMADEX ) 20 MG tablet Take 2 tablets (40 mg total) by mouth 2 (two) times daily. 11/24/22   Rolan Ezra RAMAN, MD  traZODone  (DESYREL ) 50 MG tablet Take 2 tablets (100 mg total) by mouth at bedtime. 02/07/23   Henson, Vickie L, NP-C  umeclidinium-vilanterol (ANORO ELLIPTA ) 62.5-25 MCG/ACT AEPB Inhale 1 puff into the lungs daily. 02/09/23   Henson, Vickie L, NP-C  Vitamin D , Ergocalciferol , (DRISDOL ) 1.25 MG (50000 UNIT) CAPS capsule Take 50,000 Units by mouth once a week. 06/05/23   [provider]  zolpidem  (AMBIEN ) 10 MG tablet Take 10 mg by mouth at bedtime.    [provider]    Allergies: Bee venom, Fioricet  [butalbital -apap-caffeine ], Ibuprofen , Lamisil  [terbinafine ], and Nsaids    Review of Systems  All other systems reviewed and are negative.   Updated Vital Signs BP 119/70 (BP Location: Right Arm)   Pulse 73   Temp 98.7 F (37.1 C) (Oral)   Resp (!) 24   Ht 1.651 m (5' 5)   SpO2 99%   BMI 54.91 kg/m   Physical Exam Vitals and nursing note reviewed.   Constitutional:      General: She is not in acute distress.    Appearance: She is well-developed.  HENT:     Head: Normocephalic and atraumatic.     Mouth/Throat:     Pharynx: No oropharyngeal exudate.  Eyes:     General: No scleral icterus.       Right eye: No discharge.        Left eye: No discharge.     Conjunctiva/sclera: Conjunctivae normal.     Pupils: Pupils are equal, round, and reactive to light.  Neck:     Thyroid : No thyromegaly.     Vascular: No JVD.  Cardiovascular:     Rate and Rhythm: Normal rate and regular rhythm.     Heart sounds: Normal heart sounds. No murmur heard.    No friction rub. No gallop.  Pulmonary:     Effort: Pulmonary effort is normal. No respiratory distress.     Breath sounds: Normal breath sounds. No wheezing or rales.  Abdominal:     General: Bowel sounds are normal. There is no distension.     Palpations: Abdomen is soft. There is no mass.     Tenderness: There is no abdominal tenderness.  Musculoskeletal:        General: Tenderness present. No swelling. Normal range of motion.     Cervical back: Normal range of motion and neck supple.     Right lower leg: Edema present.     Left lower leg: Edema (.milm) present.     Comments: Mild swelling of the left knee, decreased range of motion secondary to pain, edema present of both lower extremities which is symmetrical, the patient is wearing chronic compression stockings.  Mild tenderness over the lower back  Lymphadenopathy:     Cervical: No cervical adenopathy.  Skin:    General: Skin is warm and dry.     Findings: No erythema or rash.  Neurological:     Mental Status: She is alert.     Coordination: Coordination normal.     Comments: The patient is able to assist with getting up from her wheelchair and getting onto the  bed, she is able to move up the bed by herself with some minimal assistance.  She is able to follow commands with all 4 extremities and has no numbness or weakness of the  arms or the legs.  Cranial nerves III through XII are normal  Psychiatric:     Comments: Tearful when discussing her daughter and son     (all labs ordered are listed, but only abnormal results are displayed) Labs Reviewed  CBC WITH DIFFERENTIAL/PLATELET - Abnormal; Notable for the following components:      Result Value   WBC 16.9 (*)    Hemoglobin 11.9 (*)    MCV 100.5 (*)    Neutro Abs 12.8 (*)    Abs Immature Granulocytes 0.12 (*)    All other components within normal limits  COMPREHENSIVE METABOLIC PANEL WITH GFR - Abnormal; Notable for the following components:   Chloride 93 (*)    Glucose, Bld 130 (*)    Calcium  8.3 (*)    All other components within normal limits  MAGNESIUM   BRAIN NATRIURETIC PEPTIDE  URINALYSIS, ROUTINE W REFLEX MICROSCOPIC    EKG: EKG Interpretation Date/Time:  Friday October 19 2023 16:29:41 EDT Ventricular Rate:  82 PR Interval:  190 QRS Duration:  70 QT Interval:  362 QTC Calculation: 422 R Axis:   37  Text Interpretation: Normal sinus rhythm Normal ECG When compared with ECG of 10-Jun-2023 20:36, PREVIOUS ECG IS PRESENT Confirmed by Cleotilde Rogue ([id number]) on 10/19/2023 4:32:10 PM  Radiology: CT Knee Left Wo Contrast Result Date: 10/19/2023 CLINICAL DATA:  Bilateral leg pain and lower back pain. Possible fracture on same day radiographs EXAM: CT OF THE LEFT KNEE WITHOUT CONTRAST TECHNIQUE: Multidetector CT imaging of the left knee was performed according to the standard protocol. Multiplanar CT image reconstructions were also generated. RADIATION DOSE REDUCTION: This exam was performed according to the departmental dose-optimization program which includes automated exposure control, adjustment of the mA and/or kV according to patient size and/or use of iterative reconstruction technique. COMPARISON:  Same day knee radiographs FINDINGS: Bones/Joint/Cartilage No acute fracture. No dislocation. The abnormality on same-day radiograph corresponds with a  vascular channel in the lateral tibial plateau. Tricompartmental degenerative arthritis greatest in the patellofemoral and lateral compartments where it is moderate. Small knee joint effusion. Ligaments Suboptimally assessed by CT. Muscles and Tendons No acute abnormality. Soft tissues Unremarkable. IMPRESSION: 1. No acute fracture.  Small knee joint effusion. 2. Tricompartmental degenerative arthritis greatest in the patellofemoral and lateral compartments. Electronically Signed   By: Norman Gatlin M.D.   On: 10/19/2023 20:35   DG Lumbar Spine Complete Result Date: 10/19/2023 CLINICAL DATA:  Initial evaluation for acute lower back pain. EXAM: LUMBAR SPINE - COMPLETE 4+ VIEW COMPARISON:  Prior study from 06/26/2021 FINDINGS: Examination mildly limited by habitus. Five non rib-bearing lumbar type vertebral bodies. Mild dextroscoliosis. Vertebral body height maintained without acute or chronic fracture. Visualized pelvis intact. Moderate degenerative endplate spurring present throughout the lumbar spine. No visible soft tissue abnormality. IMPRESSION: 1. No radiographic evidence for acute osseous abnormality within the lumbar spine. 2. Moderate degenerative endplate spurring throughout the lumbar spine. Electronically Signed   By: Morene Hoard M.D.   On: 10/19/2023 19:27   DG Knee Complete 4 Views Right Result Date: 10/19/2023 CLINICAL DATA:  Initial evaluation for acute pain and swelling. EXAM: RIGHT KNEE - COMPLETE 4+ VIEW COMPARISON:  Prior study from 05/13/2023 FINDINGS: Diffuse osteopenia, somewhat limiting evaluation for fine osseous detail. No acute fracture dislocation. Moderate tricompartmental  degenerative osteoarthrosis. No joint effusion. No visible soft tissue abnormality. IMPRESSION: 1. No acute osseous abnormality. 2. Moderate tricompartmental degenerative osteoarthrosis. Electronically Signed   By: Morene Hoard M.D.   On: 10/19/2023 19:25   DG Knee Complete 4 Views  Left Result Date: 10/19/2023 CLINICAL DATA:  Initial evaluation for acute pain and swelling EXAM: LEFT KNEE - COMPLETE 4+ VIEW COMPARISON:  None Available. FINDINGS: Diffuse osteopenia, limiting assessment for fine osseous detail. There is question of a subtle vertically oriented lucency extending through the lateral aspect of the tibial plateau. While this could reflect a small nutrient foramen or possibly summation of shadows, a possible subtle acute nondisplaced fractures difficult to exclude. No other acute fracture dislocation. Small joint effusion. Moderate tricompartmental degenerative osteoarthrosis no visible soft tissue abnormality. IMPRESSION: 1. Question subtle vertically oriented lucency extending through the lateral aspect of the tibial plateau. While this could reflect a small nutrient foramen or possibly summation of shadows, a possible subtle acute nondisplaced fracture is difficult to exclude. Correlation with physical exam for possible pain at this location recommended. Additionally, finding could be further assessed with dedicated CT as warranted. 2. Small joint effusion. 3. Moderate tricompartmental degenerative osteoarthrosis. Electronically Signed   By: Morene Hoard M.D.   On: 10/19/2023 19:24   DG Chest Portable 1 View Result Date: 10/19/2023 CLINICAL DATA:  Dyspnea. Shortness of breath on exertion. Home oxygen . EXAM: PORTABLE CHEST 1 VIEW COMPARISON:  06/10/2023 FINDINGS: Atherosclerotic calcification of the aortic arch. Mild enlargement of the cardiopericardial silhouette. Bandlike densities in the lung bases and right mid lung favor atelectasis. Thoracic spondylosis. Indistinct pulmonary vasculature.  Thyroidectomy clips noted. IMPRESSION: 1. Mild enlargement of the cardiopericardial silhouette with indistinct pulmonary vasculature. 2. Bandlike densities in the lung bases and right mid lung favor atelectasis. 3. Thoracic spondylosis. Electronically Signed   By: Ryan Salvage  M.D.   On: 10/19/2023 17:51     Procedures   Medications Ordered in the ED  amoxicillin -clavulanate (AUGMENTIN ) 875-125 MG per tablet 1 tablet (has no administration in time range)  oxyCODONE -acetaminophen  (PERCOCET/ROXICET) 5-325 MG per tablet 2 tablet (2 tablets Oral Given 10/19/23 1750)  colchicine  tablet 1.2 mg (1.2 mg Oral Given 10/19/23 2014)    Clinical Course as of 10/19/23 2059  Fri Oct 19, 2023  1720 Blood pressure rechecked upon arrival to the room before any interventions was 108/75. [BM]    Clinical Course User Index [BM] Cleotilde Rogue, MD                                 Medical Decision Making Amount and/or Complexity of Data Reviewed Labs: ordered. Radiology: ordered.  Risk Prescription drug management.    This patient presents to the ED for concern of left knee pain and lower back pain, this involves an extensive number of treatment options, and is a complaint that carries with it a high risk of complications and morbidity.  The differential diagnosis includes gout, arthritis, complications of obesity, she denies having any significant recent falls   Co morbidities / Chronic conditions that complicate the patient evaluation  Diabetes, morbid obesity, mild pulmonary hypertension   Additional history obtained:  Additional history obtained from EMR External records from outside source obtained and reviewed including prior echocardiogram and right heart catheterization as noted above   Lab Tests:  I Ordered, and personally interpreted labs.  The pertinent results include: Leukocytosis, nonspecific, no significant anemia, metabolic panel is reassuring,  magnesium  and BNP have been performed and are normal,   Imaging Studies ordered:  I ordered imaging studies including x-rays of the chest with some atelectasis or early infiltrate, x-ray of the knees and lumbar spine without obvious findings except for small joint effusion and a possible cortical lucency, CT  scan of the knee shows no fracture I independently visualized and interpreted imaging which showed no fracture, joint effusion is present however I agree with the radiologist interpretation   Cardiac Monitoring: / EKG:  The patient was maintained on a cardiac monitor.  I personally viewed and interpreted the cardiac monitored which showed an underlying rhythm of: Normal sinus rhythm   Problem List / ED Course / Critical interventions / Medication management  The patient improved significantly with a dose of pain medication, also give dose of Augmentin  prior to discharge I ordered medication including Augmentin  Reevaluation of the patient after these medicines showed that the patient improved I have reviewed the patients home medicines and have made adjustments as needed   Social Determinants of Health:  The patient is morbidly obese, she lives by herself, she states that she is comfortable going home knowing that her results show no fractures, she is okay with the medications we have prescribed and is agreeable to follow-up with her family doctor.   Test / Admission - Considered:  Discharge home, patient is agreeable      Final diagnoses:  Acute pain of left knee  Effusion of left knee  Bilateral low back pain without sciatica, unspecified chronicity  Arthritis of both knees    ED Discharge Orders          Ordered    amoxicillin -clavulanate (AUGMENTIN ) 875-125 MG tablet  Every 12 hours        10/19/23 2056    oxyCODONE -acetaminophen  (PERCOCET/ROXICET) 5-325 MG tablet  Every 6 hours PRN        10/19/23 2056    colchicine  0.6 MG tablet  2 times daily        10/19/23 2056               Cleotilde Rogue, MD 10/19/23 2059

## 2023-10-19 NOTE — ED Notes (Signed)
 X-ray contacted to report that patient is ready for transport to x-ray.

## 2023-10-19 NOTE — Discharge Instructions (Signed)
 Your testing reveals no signs of broken bones in your knees but you do have some inflammation which is causing the pain.  For this I would like you to take colchicine  twice a day for as long as 5 days.  I have also given you a short supply of Percocet which is oxycodone .  Do not take tramadol  if you are taking this medication.  You may take 1 tablet every 6 hours as needed only for severe pain.  This medication is considered a narcotic and it can cause drowsiness sleepiness or significant confusion so do not take this if you are doing important activities such as driving.  Your x-ray suggest that there may be a slight touch of pneumonia although it is minimal, I do want you to take Augmentin  twice a day for 7 days to help with this.  Thank you for allowing us  to treat you in the emergency department today.  After reviewing your examination and potential testing that was done it appears that you are safe to go home.  I would like for you to follow-up with your doctor within the next several days, have them obtain your records and follow-up with them to review all potential tests and results from your visit.  If you should develop severe or worsening symptoms return to the emergency department immediately

## 2023-10-19 NOTE — ED Provider Triage Note (Signed)
 Emergency Medicine Provider Triage Evaluation Note  Michele Owens , a 63 y.o. female  was evaluated in triage.  Pt complains of shortness of breath.  Wears chronic oxygen .  Blood pressure low in triage.Michele Owens peripheral edema  Review of Systems  Positive: As above Negative: As above  Physical Exam  BP (!) 88/43   Pulse 87   Temp 99.1 F (37.3 C)   Resp 17   Ht 5' 5 (1.651 m)   SpO2 96%   BMI 54.91 kg/m  Gen:   Awake, no distress   Resp:  Normal effort  MSK:   Moves extremities without difficulty  Other:    Medical Decision Making  Medically screening exam initiated at 4:37 PM.  Appropriate orders placed.  Michele Owens was informed that the remainder of the evaluation will be completed by another provider, this initial triage assessment does not replace that evaluation, and the importance of remaining in the ED until their evaluation is complete.     Michele Loge, PA-C 10/19/23 1637

## 2023-10-19 NOTE — ED Notes (Signed)
 Pt very demanding and upset about care, continues calling for food. Food provided and pt still no happy with the care. Pt verbally abuse with staff taking care of her.

## 2023-10-19 NOTE — ED Triage Notes (Signed)
 Pt here for bilateral leg pain/ lower back pain/ increased SHOB on exertion. Pt usually on 2L of O2 all the time. VSS. axox4. C/O CP as well.

## 2023-11-02 ENCOUNTER — Ambulatory Visit: Admitting: Family Medicine

## 2023-11-02 ENCOUNTER — Encounter: Payer: Self-pay | Admitting: Family Medicine

## 2023-11-02 VITALS — BP 110/70 | HR 73 | Temp 97.9°F | Ht 65.0 in | Wt 314.6 lb

## 2023-11-02 DIAGNOSIS — E890 Postprocedural hypothyroidism: Secondary | ICD-10-CM

## 2023-11-02 DIAGNOSIS — E876 Hypokalemia: Secondary | ICD-10-CM

## 2023-11-02 DIAGNOSIS — I10 Essential (primary) hypertension: Secondary | ICD-10-CM

## 2023-11-02 DIAGNOSIS — Z0189 Encounter for other specified special examinations: Secondary | ICD-10-CM

## 2023-11-02 DIAGNOSIS — M10012 Idiopathic gout, left shoulder: Secondary | ICD-10-CM

## 2023-11-02 DIAGNOSIS — K5904 Chronic idiopathic constipation: Secondary | ICD-10-CM

## 2023-11-02 DIAGNOSIS — E782 Mixed hyperlipidemia: Secondary | ICD-10-CM | POA: Diagnosis not present

## 2023-11-02 DIAGNOSIS — E559 Vitamin D deficiency, unspecified: Secondary | ICD-10-CM

## 2023-11-02 DIAGNOSIS — J9611 Chronic respiratory failure with hypoxia: Secondary | ICD-10-CM

## 2023-11-02 DIAGNOSIS — E119 Type 2 diabetes mellitus without complications: Secondary | ICD-10-CM

## 2023-11-02 DIAGNOSIS — I5032 Chronic diastolic (congestive) heart failure: Secondary | ICD-10-CM

## 2023-11-02 NOTE — Patient Instructions (Addendum)
 It was a pleasure to see you today! Thank you for choosing [facility] for your primary care. Michele Owens was seen for medicine refills. We are referring you to a geriatric specialist to help manage your medication.

## 2023-11-02 NOTE — Progress Notes (Signed)
 Assessment & Plan   Assessment/Plan:  Total time spent caring for the patient today was 93 minutes. This includes time spent before the visit reviewing the chart, time spent during the visit, and time spent after the visit on documentation, etc.  Assessment & Plan Medication Refill She requires refills for multiple medications due to her complex medical history with chronic conditions. She is allergic to ibuprofen  and has experienced adverse effects from tramadol  and Ambien . - Refill buspirone , levothyroxine , spironolactone , carvedilol , Farxiga , omeprazole , levocetirizumab, and dicyclomine . - Discontinue Ambien  due to dementia risk. - Refer to geriatrics for polypharmacy and geriatric assessment.  Diabetes Mellitus She manages diabetes mellitus with medication and home blood sugar monitoring, with fasting levels of 120-135 mg/dL and postprandial up to 220 mg/dL. She requires diabetic shoes and has neuropathy likely secondary to diabetes. - Refill diabetic medications. - Sign paperwork for diabetic shoes. - Monitor blood sugar levels and adjust management as needed.  Neuropathy She has neuropathy, likely secondary to diabetes, causing numbness in her legs and feet.  Chronic Obstructive Pulmonary Disease (COPD) She is on oxygen  therapy for COPD, using 2 liters of oxygen , and uses an inhaler with albuterol  for acute symptoms. She reports occasional chest pain, particularly when walking. - Continue current inhaler regimen.  Heart Failure She is on spironolactone  and carvedilol  for heart failure and is under the care of Dr. Rolan. - Continue current heart failure medications. - Coordinate care with Dr. Rolan.  Hypothyroidism She is on levothyroxine  for hypothyroidism with a dosage discrepancy and is currently out of medication. - Refill levothyroxine  and confirm correct dosage with pharmacy.  Gout She is out of her gout medication and is attempting dietary modifications to manage  symptoms. - Refill gout medication.  Arthritis She reports arthritis affecting multiple areas including her stomach, back, and shoulder, with bone-on-bone arthritis in her knee. Tramadol  is not providing adequate relief, and she has not seen an orthopedic specialist. - Refer to orthopedic specialist for arthritis management. - Discuss alternative pain management options with Dr. Melvenia or another orthopedic specialist.  Anxiety She experiences anxiety, exacerbated by the loss of her son, and is currently taking buspirone . - Refill buspirone  for anxiety management.  Intertrigo and Athlete's Foot She has intertrigo and athlete's foot, using nystatin  and clotrimazole  for treatment. Clotrimazole  can be used for both conditions. - Refill clotrimazole  for both intertrigo and athlete's foot.  General Health Maintenance She requires updates on vaccinations and general health maintenance due to her low immune system. She has requested shingles and pneumonia vaccines. - Administer shingles and pneumonia vaccines. - Schedule blood work for routine monitoring.  Follow-up She needs close follow-up to manage her multiple chronic conditions and medication regimen. - Schedule follow-up appointment in one month. - Conduct blood work at next visit.      Medications Discontinued During This Encounter  Medication Reason   oxyCODONE -acetaminophen  (PERCOCET/ROXICET) 5-325 MG tablet    zolpidem  (AMBIEN ) 10 MG tablet    allopurinol  (ZYLOPRIM ) 100 MG tablet Reorder   aspirin  EC 81 MG tablet Reorder   budesonide  (PULMICORT ) 0.25 MG/2ML nebulizer solution Reorder   polyethylene glycol (MIRALAX  / GLYCOLAX ) 17 g packet Reorder   torsemide  (DEMADEX ) 20 MG tablet Reorder   spironolactone  (ALDACTONE ) 25 MG tablet Reorder   rosuvastatin  (CRESTOR ) 10 MG tablet Reorder   losartan  (COZAAR ) 25 MG tablet Reorder   Vitamin D , Ergocalciferol , (DRISDOL ) 1.25 MG (50000 UNIT) CAPS capsule Reorder   potassium chloride   (KLOR-CON ) 10 MEQ tablet Reorder  Return in about 1 month (around 12/03/2023) for DM, BP, HLD, fasting labs.        Subjective:   Encounter date: 11/02/2023  Lillar Bianca is a 63 y.o. female who has Tobacco abuse; OSA (obstructive sleep apnea); Non-insulin  dependent type 2 diabetes mellitus (HCC); Leukocytosis; Controlled type 2 diabetes mellitus without complication, without long-term current use of insulin  (HCC); Depression with anxiety; Benign essential HTN; Normocytic anemia; Chest pain; Obesity hypoventilation syndrome (HCC); CAP (community acquired pneumonia); Multinodular goiter w/ dominant right thyroid  nodule; Pulmonary hypertension (HCC); Chronic hypoxic respiratory failure (HCC); Acute hypoxemic respiratory failure (HCC); Acute pulmonary edema (HCC); HLD (hyperlipidemia); COPD (chronic obstructive pulmonary disease) (HCC); Gout; DVT (deep vein thrombosis) in pregnancy; Thyroid  cancer (HCC); Hypocalcemia; Hypomagnesemia; Atypical chest pain; Family history of thyroid  cancer; Encounter for geriatric assessment; COPD exacerbation (HCC); Acute and chronic respiratory failure with hypercapnia (HCC); Pulmonary HTN (HCC); Essential hypertension; Obesity; CHF (congestive heart failure) (HCC); Fall at home, initial encounter; HNP (herniated nucleus pulposus), thoracic; Severe obesity (BMI >= 40) (HCC); Acute on chronic respiratory failure with hypoxia (HCC); Hemoptysis; Encephalopathy; Chronic diastolic CHF (congestive heart failure) (HCC); Sepsis (HCC); Respiratory failure with hypercapnia (HCC); Anemia; Acute kidney injury superimposed on chronic kidney disease (HCC); Hypothyroidism; Chronic pain disorder; Acute on chronic diastolic (congestive) heart failure (HCC); Fever; Current mild episode of major depressive disorder without prior episode (HCC); Diabetic peripheral neuropathy (HCC); Anxiety; Acute on chronic diastolic CHF (congestive heart failure) (HCC); Acute respiratory failure with  hypoxia and hypercapnia (HCC); Acute respiratory failure with hypercapnia (HCC); Delirium due to multiple etiologies, acute, hyperactive; At risk for polypharmacy; Oxygen  dependent; History of COPD; Bipolar disorder (HCC); Cellulitis; UTI (urinary tract infection); Hypokalemia; Vitamin D  deficiency; and Chronic idiopathic constipation on their problem list..   She  has a past medical history of Anxiety, Arthritis, Asthma, Chronic diastolic CHF (congestive heart failure) (HCC), COPD (chronic obstructive pulmonary disease) (HCC), Depression, Diabetes mellitus without complication (HCC), GERD (gastroesophageal reflux disease), Gout, Headache, History of DVT of lower extremity, Hypertension, Morbid obesity with BMI of 45.0-49.9, adult (HCC), and Thyroid  cancer (HCC) (09/23/2016)..   She presents with chief complaint of Establish Care .   Discussed the use of AI scribe software for clinical note transcription with the patient, who gave verbal consent to proceed.  History of Present Illness Roxy Mastandrea is a 63 year old female with diabetes, COPD, and heart failure who presents to establish care and for medication refills.  She requires refills for several medications, having run out of them, and needs assistance with managing her prescriptions. Additionally, she needs paperwork signed for diabetic shoes as her current pair is wearing out.  She has diabetes and monitors her blood sugar at home, with recent readings around 120-135 mg/dL, but it can rise to 779 mg/dL postprandially. She is on medication for diabetes and takes Farxiga  for both heart and diabetes management. She has neuropathy, which causes numbness in her legs and feet.  She is on oxygen  therapy for COPD, using 2 liters of oxygen , and experiences occasional sharp chest pains, particularly when walking. She has a history of heart failure and is on several heart medications, including carvedilol  and spironolactone .  She experiences  occasional stomach cramps and takes dicyclomine  for IBS symptoms, which include diarrhea and constipation. She has arthritis with pain in her stomach, back, and shoulder. She was previously prescribed tramadol  for knee pain and reports having bone-on-bone arthritis, but tramadol  causes stomach issues. She has not seen an orthopedic doctor for her  arthritis.  She has a history of gout and is currently dieting to manage it, avoiding pork and red meat. She is out of her gout medication and needs a refill.  She has a history of anxiety and takes buspirone  for it. She reports a significant weight loss from 398 lbs to 314 lbs, which she attributes to stress and anxiety following the murder of her son two years ago.  She has a history of hypothyroidism and is out of her levothyroxine  medication. She also takes rosuvastatin  for cholesterol, omeprazole  for acid reflux, and vitamin D  supplements.  She reports a yeast infection in between her legs and uses nystatin  ointment for it. She also uses clotrimazole  for athlete's foot.  She experiences numbness in her neck and has a history of neuropathy. She is concerned about her immune system being low and requests shingles and pneumonia vaccinations.  She has a history of not being able to read, which has caused some issues with previous healthcare providers.     ROS  Past Surgical History:  Procedure Laterality Date   BUNIONECTOMY Bilateral    CARDIAC CATHETERIZATION N/A 06/23/2015   Procedure: Right/Left Heart Cath and Coronary Angiography;  Surgeon: Ezra GORMAN Shuck, MD;  Location: [facility] INVASIVE CV LAB;  Service: Cardiovascular;  Laterality: N/A;   COLONOSCOPY WITH PROPOFOL  N/A 12/15/2015   Procedure: COLONOSCOPY WITH PROPOFOL ;  Surgeon: Norleen Hint, MD;  Location: [facility] ENDOSCOPY;  Service: Endoscopy;  Laterality: N/A;   RIGHT HEART CATH N/A 10/01/2019   Procedure: RIGHT HEART CATH;  Surgeon: Shuck Ezra GORMAN, MD;  Location: [facility] INVASIVE CV LAB;  Service:  Cardiovascular;  Laterality: N/A;   THYROIDECTOMY  09/19/2016   THYROIDECTOMY N/A 09/19/2016   Procedure: TOTAL THYROIDECTOMY;  Surgeon: Eletha Boas, MD;  Location: [facility] OR;  Service: General;  Laterality: N/A;   TONSILLECTOMY     TOTAL ABDOMINAL HYSTERECTOMY  07/14/10    Outpatient Medications Prior to Visit  Medication Sig Dispense Refill   ACETAMINOPHEN  PO Take 1,300-2,050 mg by mouth daily as needed for moderate pain (pain score 4-6) or headache.     albuterol  (VENTOLIN  HFA) 108 (90 Base) MCG/ACT inhaler Inhale 1-2 puffs into the lungs every 6 (six) hours as needed for wheezing or shortness of breath. 1 each 0   amoxicillin -clavulanate (AUGMENTIN ) 875-125 MG tablet Take 1 tablet by mouth every 12 (twelve) hours. 14 tablet 0   blood glucose meter kit and supplies KIT Dispense based on patient and insurance preference. Use up to four times daily as directed. 1 each 0   calcitRIOL  (ROCALTROL ) 0.5 MCG capsule Take 1 capsule (0.5 mcg total) by mouth daily. 30 capsule 0   doxycycline  (VIBRAMYCIN ) 100 MG capsule Take 1 capsule (100 mg total) by mouth 2 (two) times daily. 14 capsule 0   EPINEPHrine  0.3 mg/0.3 mL IJ SOAJ injection Inject 0.3 mg into the muscle once as needed for anaphylaxis.     fenofibrate  (TRICOR ) 145 MG tablet Take 1 tablet (145 mg total) by mouth daily. 30 tablet 11   nitroGLYCERIN  (NITROSTAT ) 0.4 MG SL tablet Place 1 tablet (0.4 mg total) under the tongue every 5 (five) minutes as needed for chest pain. 12 tablet 6   nystatin  cream (MYCOSTATIN ) Apply 1 Application topically 2 (two) times daily as needed for dry skin.     ondansetron  (ZOFRAN -ODT) 4 MG disintegrating tablet Take 1 tablet (4 mg total) by mouth every 8 (eight) hours as needed. 20 tablet 0   OXYGEN  Inhale 2 L into the lungs  continuous.     senna-docusate (SENOKOT-S) 8.6-50 MG tablet Take 1 tablet by mouth daily. 30 tablet 5   allopurinol  (ZYLOPRIM ) 100 MG tablet Take 100 mg by mouth daily.     aspirin  EC 81 MG tablet  Take 81 mg by mouth daily. Swallow whole.     budesonide  (PULMICORT ) 0.25 MG/2ML nebulizer solution Inhale 2 mLs (0.25 mg total) by nebulization 2 (two) times daily. (Patient taking differently: Take 0.25 mg by nebulization daily as needed (for shortness of breath or wheezing).) 120 mL 0   busPIRone  (BUSPAR ) 5 MG tablet Take 1 tablet (5 mg total) by mouth 3 (three) times daily. (Patient taking differently: Take 10 mg by mouth 3 (three) times daily.) 90 tablet 0   butalbital -acetaminophen -caffeine  (FIORICET ) 50-325-40 MG tablet Take 1-2 tablets by mouth every 6 (six) hours as needed for headache. 20 tablet 0   carvedilol  (COREG ) 6.25 MG tablet Take 1 tablet (6.25 mg total) by mouth 2 (two) times daily. 60 tablet 11   clotrimazole  (LOTRIMIN ) 1 % cream Apply topically 2 (two) times daily. 30 g 0   colchicine  0.6 MG tablet Take 1 tablet (0.6 mg total) by mouth 2 (two) times daily for 5 days. 10 tablet 0   dicyclomine  (BENTYL ) 20 MG tablet Take 1 tablet (20 mg total) by mouth 2 (two) times daily. 20 tablet 0   FARXIGA  10 MG TABS tablet Take 1 tablet (10 mg total) by mouth daily before breakfast. 30 tablet 11   FEROSUL 325 (65 Fe) MG tablet Take 1 tablet (325 mg total) by mouth daily. 30 tablet 1   levocetirizine (XYZAL ) 5 MG tablet Take 1 tablet (5 mg total) by mouth at bedtime. 30 tablet 0   levothyroxine  (SYNTHROID ) 125 MCG tablet Take 125 mcg by mouth at bedtime.     losartan  (COZAAR ) 25 MG tablet Take 1 tablet (25 mg total) by mouth daily. 30 tablet 11   omeprazole  (PRILOSEC) 20 MG capsule Take 1 capsule (20 mg total) by mouth 2 (two) times daily as needed (acid reflux). (Patient taking differently: Take 40 mg by mouth daily.) 60 capsule 0   oxyCODONE -acetaminophen  (PERCOCET/ROXICET) 5-325 MG tablet Take 1 tablet by mouth every 6 (six) hours as needed for severe pain (pain score 7-10). 8 tablet 0   polyethylene glycol (MIRALAX  / GLYCOLAX ) 17 g packet Take 17 g by mouth daily as needed for mild  constipation. 30 each 0   potassium chloride  (KLOR-CON ) 10 MEQ tablet Take 1 tablet (10 mEq total) by mouth 2 (two) times daily. 60 tablet 6   rosuvastatin  (CRESTOR ) 10 MG tablet Take 1 tablet (10 mg total) by mouth every evening. 30 tablet 11   spironolactone  (ALDACTONE ) 25 MG tablet Take 1 tablet (25 mg total) by mouth daily. 30 tablet 11   torsemide  (DEMADEX ) 20 MG tablet Take 2 tablets (40 mg total) by mouth 2 (two) times daily. 120 tablet 11   traZODone  (DESYREL ) 50 MG tablet Take 2 tablets (100 mg total) by mouth at bedtime. 60 tablet 0   umeclidinium-vilanterol (ANORO ELLIPTA ) 62.5-25 MCG/ACT AEPB Inhale 1 puff into the lungs daily. 98 each 0   Vitamin D , Ergocalciferol , (DRISDOL ) 1.25 MG (50000 UNIT) CAPS capsule Take 50,000 Units by mouth once a week.     zolpidem  (AMBIEN ) 10 MG tablet Take 10 mg by mouth at bedtime.     No facility-administered medications prior to visit.    Family History  Problem Relation Age of Onset   Cancer Father  thought to be due to exposure to concrete   Diabetes Mother    Thyroid  cancer Mother        dx in her 1s-60s   Cancer Maternal Uncle        2 uncles with cancer NOS   Brain cancer Paternal Aunt    Cancer Cousin        maternal first cousin - NOS   Cancer Cousin        maternal first cousin - NOS    Social History   Socioeconomic History   Marital status: Single    Spouse name: Not on file   Number of children: 1   Years of education: Not on file   Highest education level: Not on file  Occupational History   Occupation: disabled  Tobacco Use   Smoking status: Former    Current packs/day: 0.00    Average packs/day: 0.5 packs/day for 41.0 years (20.5 ttl pk-yrs)    Types: Cigarettes    Start date: 07/1975    Quit date: 07/2016    Years since quitting: 7.3   Smokeless tobacco: Never  Vaping Use   Vaping status: Never Used  Substance and Sexual Activity   Alcohol use: Not Currently    Comment: quit 12 years ago   Drug  use: Not Currently    Types: Marijuana, Cocaine    Comment: quit 12 years ago   Sexual activity: Yes  Other Topics Concern   Not on file  Social History Narrative   Not on file   Social Drivers of Health   Financial Resource Strain: Low Risk  (10/24/2022)   Overall Financial Resource Strain (CARDIA)    Difficulty of Paying Living Expenses: Not very hard  Food Insecurity: No Food Insecurity (09/09/2023)   Hunger Vital Sign    Worried About Running Out of Food in the Last Year: Never true    Ran Out of Food in the Last Year: Never true  Transportation Needs: No Transportation Needs (09/09/2023)   PRAPARE - Administrator, Civil Service (Medical): No    Lack of Transportation (Non-Medical): No  Physical Activity: Inactive (02/01/2021)   Exercise Vital Sign    Days of Exercise per Week: 0 days    Minutes of Exercise per Session: 0 min  Stress: Stress Concern Present (02/23/2021)   [employer] of Occupational Health - Occupational Stress Questionnaire    Feeling of Stress : To some extent  Social Connections: Unknown (08/21/2021)   Received from [facility]   Social Network    Social Network: Not on file  Intimate Partner Violence: Not At Risk (09/09/2023)   Humiliation, Afraid, Rape, and Kick questionnaire    Fear of Current or Ex-Partner: No    Emotionally Abused: No    Physically Abused: No    Sexually Abused: No                                                                                                  Objective:  Physical Exam: BP 110/70   Pulse 73   Temp 97.9 F (  36.6 C)   Ht 5' 5 (1.651 m)   Wt (!) 314 lb 9.6 oz (142.7 kg)   SpO2 94% Comment: 2L  BMI 52.35 kg/m    Physical Exam GENERAL: Alert, cooperative, well developed, no acute distress. HEENT: Normocephalic, normal oropharynx, moist mucous membranes. CHEST: Clear to auscultation bilaterally, no wheezes, rhonchi, or crackles. CARDIOVASCULAR: Normal heart rate and rhythm, S1 and S2  normal without murmurs. ABDOMEN: Soft, non-tender, non-distended, without organomegaly, normal bowel sounds. EXTREMITIES: No cyanosis or edema. NEUROLOGICAL: Cranial nerves grossly intact, moves all extremities without gross motor or sensory deficit.   Physical Exam  CT Knee Left Wo Contrast Result Date: 10/19/2023 CLINICAL DATA:  Bilateral leg pain and lower back pain. Possible fracture on same day radiographs EXAM: CT OF THE LEFT KNEE WITHOUT CONTRAST TECHNIQUE: Multidetector CT imaging of the left knee was performed according to the standard protocol. Multiplanar CT image reconstructions were also generated. RADIATION DOSE REDUCTION: This exam was performed according to the departmental dose-optimization program which includes automated exposure control, adjustment of the mA and/or kV according to patient size and/or use of iterative reconstruction technique. COMPARISON:  Same day knee radiographs FINDINGS: Bones/Joint/Cartilage No acute fracture. No dislocation. The abnormality on same-day radiograph corresponds with a vascular channel in the lateral tibial plateau. Tricompartmental degenerative arthritis greatest in the patellofemoral and lateral compartments where it is moderate. Small knee joint effusion. Ligaments Suboptimally assessed by CT. Muscles and Tendons No acute abnormality. Soft tissues Unremarkable. IMPRESSION: 1. No acute fracture.  Small knee joint effusion. 2. Tricompartmental degenerative arthritis greatest in the patellofemoral and lateral compartments. Electronically Signed   By: Norman Gatlin M.D.   On: 10/19/2023 20:35   DG Lumbar Spine Complete Result Date: 10/19/2023 CLINICAL DATA:  Initial evaluation for acute lower back pain. EXAM: LUMBAR SPINE - COMPLETE 4+ VIEW COMPARISON:  Prior study from 06/26/2021 FINDINGS: Examination mildly limited by habitus. Five non rib-bearing lumbar type vertebral bodies. Mild dextroscoliosis. Vertebral body height maintained without acute or  chronic fracture. Visualized pelvis intact. Moderate degenerative endplate spurring present throughout the lumbar spine. No visible soft tissue abnormality. IMPRESSION: 1. No radiographic evidence for acute osseous abnormality within the lumbar spine. 2. Moderate degenerative endplate spurring throughout the lumbar spine. Electronically Signed   By: Morene Hoard M.D.   On: 10/19/2023 19:27   DG Knee Complete 4 Views Right Result Date: 10/19/2023 CLINICAL DATA:  Initial evaluation for acute pain and swelling. EXAM: RIGHT KNEE - COMPLETE 4+ VIEW COMPARISON:  Prior study from 05/13/2023 FINDINGS: Diffuse osteopenia, somewhat limiting evaluation for fine osseous detail. No acute fracture dislocation. Moderate tricompartmental degenerative osteoarthrosis. No joint effusion. No visible soft tissue abnormality. IMPRESSION: 1. No acute osseous abnormality. 2. Moderate tricompartmental degenerative osteoarthrosis. Electronically Signed   By: Morene Hoard M.D.   On: 10/19/2023 19:25   DG Knee Complete 4 Views Left Result Date: 10/19/2023 CLINICAL DATA:  Initial evaluation for acute pain and swelling EXAM: LEFT KNEE - COMPLETE 4+ VIEW COMPARISON:  None Available. FINDINGS: Diffuse osteopenia, limiting assessment for fine osseous detail. There is question of a subtle vertically oriented lucency extending through the lateral aspect of the tibial plateau. While this could reflect a small nutrient foramen or possibly summation of shadows, a possible subtle acute nondisplaced fractures difficult to exclude. No other acute fracture dislocation. Small joint effusion. Moderate tricompartmental degenerative osteoarthrosis no visible soft tissue abnormality. IMPRESSION: 1. Question subtle vertically oriented lucency extending through the lateral aspect of the tibial plateau. While this could  reflect a small nutrient foramen or possibly summation of shadows, a possible subtle acute nondisplaced fracture is difficult  to exclude. Correlation with physical exam for possible pain at this location recommended. Additionally, finding could be further assessed with dedicated CT as warranted. 2. Small joint effusion. 3. Moderate tricompartmental degenerative osteoarthrosis. Electronically Signed   By: Morene Hoard M.D.   On: 10/19/2023 19:24   DG Chest Portable 1 View Result Date: 10/19/2023 CLINICAL DATA:  Dyspnea. Shortness of breath on exertion. Home oxygen . EXAM: PORTABLE CHEST 1 VIEW COMPARISON:  06/10/2023 FINDINGS: Atherosclerotic calcification of the aortic arch. Mild enlargement of the cardiopericardial silhouette. Bandlike densities in the lung bases and right mid lung favor atelectasis. Thoracic spondylosis. Indistinct pulmonary vasculature.  Thyroidectomy clips noted. IMPRESSION: 1. Mild enlargement of the cardiopericardial silhouette with indistinct pulmonary vasculature. 2. Bandlike densities in the lung bases and right mid lung favor atelectasis. 3. Thoracic spondylosis. Electronically Signed   By: Ryan Salvage M.D.   On: 10/19/2023 17:51   CT ABDOMEN PELVIS W CONTRAST Result Date: 09/06/2023 CLINICAL DATA:  Abdomen pain EXAM: CT ABDOMEN AND PELVIS WITH CONTRAST TECHNIQUE: Multidetector CT imaging of the abdomen and pelvis was performed using the standard protocol following bolus administration of intravenous contrast. RADIATION DOSE REDUCTION: This exam was performed according to the departmental dose-optimization program which includes automated exposure control, adjustment of the mA and/or kV according to patient size and/or use of iterative reconstruction technique. CONTRAST:  OMNIPAQUE  IOHEXOL  350 MG/ML SOLN COMPARISON:  CT 08/29/2023 FINDINGS: Lower chest: Lung bases demonstrate hazy areas of atelectasis. Hepatobiliary: Liver appears enlarged measuring 21 cm craniocaudad. Diffuse hepatic steatosis. No calcified gallstone or biliary dilatation Pancreas: Unremarkable. No pancreatic ductal  dilatation or surrounding inflammatory changes. Spleen: Normal in size without focal abnormality. Adrenals/Urinary Tract: Adrenal glands are unremarkable. Kidneys are normal, without renal calculi, focal lesion, or hydronephrosis. Bladder is unremarkable. Stomach/Bowel: Stomach is within normal limits. No evidence of bowel wall thickening, distention, or inflammatory changes. Vascular/Lymphatic: Aortic atherosclerosis. No enlarged abdominal or pelvic lymph nodes. Reproductive: Status post hysterectomy. No adnexal masses. Other: Negative for pelvic effusion or free air. New skin thickening and soft tissue stranding involving the periumbilical region and infraumbilical pannus. No organized fluid collection. Musculoskeletal: No acute osseous abnormality. IMPRESSION: 1. Negative for acute intra-abdominal or pelvic abnormality. 2. Hepatomegaly with hepatic steatosis. 3. New skin thickening and soft tissue stranding involving the periumbilical region and infraumbilical pannus, correlate for cellulitis. No organized fluid collection. 4. Aortic atherosclerosis. Aortic Atherosclerosis (ICD10-I70.0). Electronically Signed   By: Luke Bun M.D.   On: 09/06/2023 21:08   CT ABDOMEN PELVIS W CONTRAST Result Date: 08/29/2023 CLINICAL DATA:  Acute nonlocalized abdominal pain, right abdominal pain, low back pain EXAM: CT ABDOMEN AND PELVIS WITH CONTRAST TECHNIQUE: Multidetector CT imaging of the abdomen and pelvis was performed using the standard protocol following bolus administration of intravenous contrast. RADIATION DOSE REDUCTION: This exam was performed according to the departmental dose-optimization program which includes automated exposure control, adjustment of the mA and/or kV according to patient size and/or use of iterative reconstruction technique. CONTRAST:  OMNIPAQUE  IOHEXOL  300 MG/ML  SOLN COMPARISON:  06/06/2023 FINDINGS: Lower chest: No acute abnormality. Hepatobiliary: Mild hepatomegaly and hepatic  steatosis. No enhancing intrahepatic mass. No intra or extrahepatic biliary ductal dilation. Gallbladder unremarkable. Pancreas: Unremarkable Spleen: Unremarkable Adrenals/Urinary Tract: Adrenal glands are unremarkable. Kidneys are normal, without renal calculi, focal lesion, or hydronephrosis. Bladder is unremarkable. Stomach/Bowel: Stomach is within normal limits. Appendix appears normal. No  evidence of bowel wall thickening, distention, or inflammatory changes. Vascular/Lymphatic: No significant vascular findings are present. No enlarged abdominal or pelvic lymph nodes. Reproductive: Status post hysterectomy. No adnexal masses. Other: No abdominal wall hernia or abnormality. No abdominopelvic ascites. Musculoskeletal: No acute bone abnormality. No lytic or blastic bone lesion. Osseous structures are age appropriate. IMPRESSION: 1. No acute intra-abdominal pathology identified. 2. Mild hepatomegaly and hepatic steatosis. Electronically Signed   By: Dorethia Molt M.D.   On: 08/29/2023 02:38    Recent Results (from the past 2160 hours)  Comprehensive metabolic panel     Status: Abnormal   Collection Time: 08/29/23 12:56 AM  Result Value Ref Range   Sodium 136 135 - 145 mmol/L   Potassium 3.5 3.5 - 5.1 mmol/L   Chloride 97 (L) 98 - 111 mmol/L   CO2 28 22 - 32 mmol/L   Glucose, Bld 117 (H) 70 - 99 mg/dL    Comment: Glucose reference range applies only to samples taken after fasting for at least 8 hours.   BUN 16 8 - 23 mg/dL   Creatinine, Ser 9.02 0.44 - 1.00 mg/dL   Calcium  8.7 (L) 8.9 - 10.3 mg/dL   Total Protein 7.4 6.5 - 8.1 g/dL   Albumin  4.1 3.5 - 5.0 g/dL   AST 19 15 - 41 U/L   ALT 14 0 - 44 U/L   Alkaline Phosphatase 64 38 - 126 U/L   Total Bilirubin 0.6 0.0 - 1.2 mg/dL   GFR, Estimated >39 >39 mL/min    Comment: (NOTE) Calculated using the CKD-EPI Creatinine Equation (2021)    Anion gap 11 5 - 15    Comment: Performed at [facility], [address]  [place], [place] [address]  Lipase, blood     Status: None   Collection Time: 08/29/23 12:56 AM  Result Value Ref Range   Lipase 30 11 - 51 U/L    Comment: Performed at [facility], [address] [place], [place] [address]  CBC with Diff     Status: Abnormal   Collection Time: 08/29/23 12:56 AM  Result Value Ref Range   WBC 16.0 (H) 4.0 - 10.5 K/uL   RBC 4.10 3.87 - 5.11 MIL/uL   Hemoglobin 12.4 12.0 - 15.0 g/dL   HCT 59.9 63.9 - 53.9 %   MCV 97.6 80.0 - 100.0 fL   MCH 30.2 26.0 - 34.0 pg   MCHC 31.0 30.0 - 36.0 g/dL   RDW 86.7 88.4 - 84.4 %   Platelets 336 150 - 400 K/uL   nRBC 0.0 0.0 - 0.2 %   Neutrophils Relative % 75 %   Neutro Abs 12.2 (H) 1.7 - 7.7 K/uL   Lymphocytes Relative 16 %   Lymphs Abs 2.5 0.7 - 4.0 K/uL   Monocytes Relative 6 %   Monocytes Absolute 1.0 0.1 - 1.0 K/uL   Eosinophils Relative 1 %   Eosinophils Absolute 0.2 0.0 - 0.5 K/uL   Basophils Relative 1 %   Basophils Absolute 0.1 0.0 - 0.1 K/uL   Immature Granulocytes 1 %   Abs Immature Granulocytes 0.13 (H) 0.00 - 0.07 K/uL    Comment: Performed at [facility], [address] [place], [place] [address]  Urinalysis, Routine w reflex microscopic -Urine, Clean Catch     Status: Abnormal   Collection Time: 08/29/23  3:26 AM  Result Value Ref Range   Color, Urine YELLOW YELLOW   APPearance CLEAR CLEAR  Specific Gravity, Urine 1.020 1.005 - 1.030   pH 5.0 5.0 - 8.0   Glucose, UA >=500 (A) NEGATIVE mg/dL   Hgb urine dipstick NEGATIVE NEGATIVE   Bilirubin Urine NEGATIVE NEGATIVE   Ketones, ur NEGATIVE NEGATIVE mg/dL   Protein, ur NEGATIVE NEGATIVE mg/dL   Nitrite NEGATIVE NEGATIVE   Leukocytes,Ua NEGATIVE NEGATIVE   RBC / HPF 0-5 0 - 5 RBC/hpf   WBC, UA 0-5 0 - 5 WBC/hpf   Bacteria, UA NONE SEEN NONE SEEN   Squamous Epithelial / HPF 0-5 0 - 5 /HPF   Mucus PRESENT    Hyaline Casts, UA PRESENT     Comment: Performed at [facility], [address] [place], [place] [address]  Comprehensive metabolic panel     Status: Abnormal   Collection Time: 09/06/23  6:36 PM  Result Value Ref Range   Sodium 147 (H) 135 - 145 mmol/L   Potassium 4.7 3.5 - 5.1 mmol/L    Comment: HEMOLYSIS AT THIS LEVEL MAY AFFECT RESULT   Chloride 103 98 - 111 mmol/L   CO2 34 (H) 22 - 32 mmol/L   Glucose, Bld 108 (H) 70 - 99 mg/dL    Comment: Glucose reference range applies only to samples taken after fasting for at least 8 hours.   BUN 16 8 - 23 mg/dL   Creatinine, Ser 9.05 0.44 - 1.00 mg/dL   Calcium  8.8 (L) 8.9 - 10.3 mg/dL   Total Protein 6.6 6.5 - 8.1 g/dL   Albumin  3.5 3.5 - 5.0 g/dL   AST 33 15 - 41 U/L    Comment: HEMOLYSIS AT THIS LEVEL MAY AFFECT RESULT   ALT 36 0 - 44 U/L    Comment: HEMOLYSIS AT THIS LEVEL MAY AFFECT RESULT   Alkaline Phosphatase 49 38 - 126 U/L   Total Bilirubin 0.9 0.0 - 1.2 mg/dL    Comment: HEMOLYSIS AT THIS LEVEL MAY AFFECT RESULT   GFR, Estimated >60 >60 mL/min    Comment: (NOTE) Calculated using the CKD-EPI Creatinine Equation (2021)    Anion gap 10 5 - 15    Comment: Performed at [facility], [address], [place], [place] [address]  CBC with Differential     Status: Abnormal   Collection Time: 09/06/23  6:36 PM  Result Value Ref Range   WBC 15.8 (H) 4.0 - 10.5 K/uL   RBC 3.62 (L) 3.87 - 5.11 MIL/uL   Hemoglobin 10.9 (L) 12.0 - 15.0 g/dL   HCT 62.7 63.9 - 53.9 %   MCV 102.8 (H) 80.0 - 100.0 fL   MCH 30.1 26.0 - 34.0 pg   MCHC 29.3 (L) 30.0 - 36.0 g/dL   RDW 86.8 88.4 - 84.4 %   Platelets 264 150 - 400 K/uL   nRBC 0.0 0.0 - 0.2 %   Neutrophils Relative % 80 %   Neutro Abs 12.7 (H) 1.7 - 7.7 K/uL   Lymphocytes Relative 12 %   Lymphs Abs 1.9 0.7 - 4.0 K/uL   Monocytes Relative 5 %   Monocytes Absolute 0.9 0.1 - 1.0 K/uL   Eosinophils Relative 2 %   Eosinophils Absolute 0.3 0.0 - 0.5 K/uL   Basophils Relative 0 %   Basophils Absolute 0.1 0.0 - 0.1 K/uL   Immature Granulocytes 1 %   Abs Immature  Granulocytes 0.13 (H) 0.00 - 0.07 K/uL    Comment: Performed at [facility], [address] [place], [place] [address]  Brain natriuretic peptide     Status: None   Collection Time: 09/06/23  6:36 PM  Result Value Ref Range   B Natriuretic Peptide 35.0 0.0 - 100.0 pg/mL    Comment: Performed at [facility], [address] [place], [place] [address]  Rapid urine drug screen (hospital performed)     Status: Abnormal   Collection Time: 09/07/23  1:37 PM  Result Value Ref Range   Opiates NONE DETECTED NONE DETECTED   Cocaine NONE DETECTED NONE DETECTED   Benzodiazepines NONE DETECTED NONE DETECTED   Amphetamines NONE DETECTED NONE DETECTED   Tetrahydrocannabinol POSITIVE (A) NONE DETECTED   Barbiturates NONE DETECTED NONE DETECTED    Comment: (NOTE) DRUG SCREEN FOR MEDICAL PURPOSES ONLY.  IF CONFIRMATION IS NEEDED FOR ANY PURPOSE, NOTIFY LAB WITHIN 5 DAYS.  LOWEST DETECTABLE LIMITS FOR URINE DRUG SCREEN Drug Class                     Cutoff (ng/mL) Amphetamine and metabolites    1000 Barbiturate and metabolites    200 Benzodiazepine                 200 Opiates and metabolites        300 Cocaine and metabolites        300 THC                            50 Performed at [facility], [address] [place], [place] [address]   Urinalysis, Routine w reflex microscopic -Urine, Clean Catch     Status: Abnormal   Collection Time: 09/09/23  7:24 AM  Result Value Ref Range   Color, Urine YELLOW YELLOW   APPearance HAZY (A) CLEAR   Specific Gravity, Urine 1.008 1.005 - 1.030   pH 7.0 5.0 - 8.0   Glucose, UA >=500 (A) NEGATIVE mg/dL   Hgb urine dipstick MODERATE (A) NEGATIVE   Bilirubin Urine NEGATIVE NEGATIVE   Ketones, ur NEGATIVE NEGATIVE mg/dL   Protein, ur NEGATIVE NEGATIVE mg/dL   Nitrite NEGATIVE NEGATIVE   Leukocytes,Ua LARGE (A) NEGATIVE   RBC / HPF 21-50 0 - 5 RBC/hpf   WBC, UA 21-50 0 - 5 WBC/hpf   Bacteria, UA RARE (A) NONE SEEN   Squamous  Epithelial / HPF 0-5 0 - 5 /HPF   WBC Clumps PRESENT    Mucus PRESENT     Comment: Performed at [facility], [address] [place], [place] [address]  Urine Culture     Status: Abnormal   Collection Time: 09/09/23  8:34 AM   Specimen: Urine, Clean Catch  Result Value Ref Range   Specimen Description URINE, CLEAN CATCH    Special Requests      NONE Performed at [facility], [address] [place], [place] [address]    Culture >=100,000 COLONIES/mL ESCHERICHIA COLI (A)    Report Status 09/11/2023 FINAL    Organism ID, Bacteria ESCHERICHIA COLI (A)       Susceptibility   Escherichia coli - MIC*    AMPICILLIN <=2 SENSITIVE Sensitive     CEFAZOLIN  <=4 SENSITIVE Sensitive     CEFEPIME  <=0.12 SENSITIVE Sensitive     CEFTRIAXONE  <=0.25 SENSITIVE Sensitive     CIPROFLOXACIN <=0.25 SENSITIVE Sensitive     GENTAMICIN <=1 SENSITIVE Sensitive     IMIPENEM <=0.25 SENSITIVE Sensitive     NITROFURANTOIN <=16 SENSITIVE Sensitive  TRIMETH/SULFA <=20 SENSITIVE Sensitive     AMPICILLIN/SULBACTAM <=2 SENSITIVE Sensitive     PIP/TAZO <=4 SENSITIVE Sensitive ug/mL    * >=100,000 COLONIES/mL ESCHERICHIA COLI  Glucose, capillary     Status: Abnormal   Collection Time: 09/09/23 11:54 AM  Result Value Ref Range   Glucose-Capillary 154 (H) 70 - 99 mg/dL    Comment: Glucose reference range applies only to samples taken after fasting for at least 8 hours.  Hemoglobin A1c     Status: Abnormal   Collection Time: 09/09/23  2:38 PM  Result Value Ref Range   Hgb A1c MFr Bld 6.8 (H) 4.8 - 5.6 %    Comment: (NOTE) Diagnosis of Diabetes The following HbA1c ranges recommended by the American Diabetes Association (ADA) may be used as an aid in the diagnosis of diabetes mellitus.  Hemoglobin             Suggested A1C NGSP%              Diagnosis  <5.7                   Non Diabetic  5.7-6.4                Pre-Diabetic  >6.4                   Diabetic  <7.0                   Glycemic  control for                       adults with diabetes.     Mean Plasma Glucose 148.46 mg/dL    Comment: Performed at [facility], [address], [place], [place] [address]  Comprehensive metabolic panel     Status: Abnormal   Collection Time: 09/09/23  2:38 PM  Result Value Ref Range   Sodium 139 135 - 145 mmol/L   Potassium 3.6 3.5 - 5.1 mmol/L   Chloride 89 (L) 98 - 111 mmol/L   CO2 33 (H) 22 - 32 mmol/L   Glucose, Bld 263 (H) 70 - 99 mg/dL    Comment: Glucose reference range applies only to samples taken after fasting for at least 8 hours.   BUN 22 8 - 23 mg/dL   Creatinine, Ser 8.55 (H) 0.44 - 1.00 mg/dL   Calcium  8.9 8.9 - 10.3 mg/dL   Total Protein 7.3 6.5 - 8.1 g/dL   Albumin  3.8 3.5 - 5.0 g/dL   AST 28 15 - 41 U/L   ALT 37 0 - 44 U/L   Alkaline Phosphatase 57 38 - 126 U/L   Total Bilirubin 0.5 0.0 - 1.2 mg/dL   GFR, Estimated 41 (L) >60 mL/min    Comment: (NOTE) Calculated using the CKD-EPI Creatinine Equation (2021)    Anion gap 17 (H) 5 - 15    Comment: Performed at [facility], [address], [place], [place] [address]  Glucose, capillary     Status: Abnormal   Collection Time: 09/09/23  4:30 PM  Result Value Ref Range   Glucose-Capillary 239 (H) 70 - 99 mg/dL    Comment: Glucose reference range applies only to samples taken after fasting for at least 8 hours.  CBC     Status: Abnormal   Collection Time: 09/10/23  5:59 AM  Result Value Ref Range   WBC 17.4 (H) 4.0 - 10.5 K/uL  RBC 3.83 (L) 3.87 - 5.11 MIL/uL   Hemoglobin 11.7 (L) 12.0 - 15.0 g/dL   HCT 63.1 63.9 - 53.9 %   MCV 96.1 80.0 - 100.0 fL   MCH 30.5 26.0 - 34.0 pg   MCHC 31.8 30.0 - 36.0 g/dL   RDW 86.6 88.4 - 84.4 %   Platelets 255 150 - 400 K/uL   nRBC 0.0 0.0 - 0.2 %    Comment: Performed at [facility], [address] [place], [place] [address]  Comprehensive metabolic panel     Status: Abnormal   Collection Time: 09/10/23  5:59 AM  Result Value Ref Range   Sodium  140 135 - 145 mmol/L   Potassium 3.3 (L) 3.5 - 5.1 mmol/L   Chloride 92 (L) 98 - 111 mmol/L   CO2 34 (H) 22 - 32 mmol/L   Glucose, Bld 91 70 - 99 mg/dL    Comment: Glucose reference range applies only to samples taken after fasting for at least 8 hours.   BUN 22 8 - 23 mg/dL   Creatinine, Ser 9.04 0.44 - 1.00 mg/dL   Calcium  8.5 (L) 8.9 - 10.3 mg/dL   Total Protein 6.8 6.5 - 8.1 g/dL   Albumin  3.5 3.5 - 5.0 g/dL   AST 18 15 - 41 U/L   ALT 28 0 - 44 U/L   Alkaline Phosphatase 50 38 - 126 U/L   Total Bilirubin 0.6 0.0 - 1.2 mg/dL   GFR, Estimated >39 >39 mL/min    Comment: (NOTE) Calculated using the CKD-EPI Creatinine Equation (2021)    Anion gap 14 5 - 15    Comment: Performed at [facility], [address] [place], [place] [address]  Glucose, capillary     Status: Abnormal   Collection Time: 09/10/23  8:32 AM  Result Value Ref Range   Glucose-Capillary 108 (H) 70 - 99 mg/dL    Comment: Glucose reference range applies only to samples taken after fasting for at least 8 hours.  Glucose, capillary     Status: Abnormal   Collection Time: 09/10/23 12:57 PM  Result Value Ref Range   Glucose-Capillary 212 (H) 70 - 99 mg/dL    Comment: Glucose reference range applies only to samples taken after fasting for at least 8 hours.  Glucose, capillary     Status: Abnormal   Collection Time: 09/10/23  5:13 PM  Result Value Ref Range   Glucose-Capillary 229 (H) 70 - 99 mg/dL    Comment: Glucose reference range applies only to samples taken after fasting for at least 8 hours.  Basic metabolic panel with GFR     Status: Abnormal   Collection Time: 09/11/23  3:57 AM  Result Value Ref Range   Sodium 140 135 - 145 mmol/L   Potassium 3.5 3.5 - 5.1 mmol/L   Chloride 92 (L) 98 - 111 mmol/L   CO2 36 (H) 22 - 32 mmol/L   Glucose, Bld 108 (H) 70 - 99 mg/dL    Comment: Glucose reference range applies only to samples taken after fasting for at least 8 hours.   BUN 20 8 - 23 mg/dL   Creatinine,  Ser 8.96 (H) 0.44 - 1.00 mg/dL   Calcium  8.3 (L) 8.9 - 10.3 mg/dL   GFR, Estimated >39 >39 mL/min    Comment: (NOTE) Calculated using the CKD-EPI Creatinine Equation (2021)    Anion gap 12 5 - 15    Comment: Performed at [facility], [address], [place], [place] [address]  Magnesium      Status: None   Collection Time: 09/11/23  3:57 AM  Result Value Ref Range   Magnesium  2.3 1.7 - 2.4 mg/dL    Comment: Performed at [facility], [address], [place], [place] [address]  Glucose, capillary     Status: None   Collection Time: 09/11/23  7:46 AM  Result Value Ref Range   Glucose-Capillary 98 70 - 99 mg/dL    Comment: Glucose reference range applies only to samples taken after fasting for at least 8 hours.  Glucose, capillary     Status: Abnormal   Collection Time: 09/11/23 11:58 AM  Result Value Ref Range   Glucose-Capillary 150 (H) 70 - 99 mg/dL    Comment: Glucose reference range applies only to samples taken after fasting for at least 8 hours.  Glucose, capillary     Status: Abnormal   Collection Time: 09/11/23  5:30 PM  Result Value Ref Range   Glucose-Capillary 217 (H) 70 - 99 mg/dL    Comment: Glucose reference range applies only to samples taken after fasting for at least 8 hours.  CBC with Differential     Status: Abnormal   Collection Time: 10/19/23  6:32 PM  Result Value Ref Range   WBC 16.9 (H) 4.0 - 10.5 K/uL   RBC 3.93 3.87 - 5.11 MIL/uL   Hemoglobin 11.9 (L) 12.0 - 15.0 g/dL   HCT 60.4 63.9 - 53.9 %   MCV 100.5 (H) 80.0 - 100.0 fL   MCH 30.3 26.0 - 34.0 pg   MCHC 30.1 30.0 - 36.0 g/dL   RDW 87.2 88.4 - 84.4 %   Platelets 306 150 - 400 K/uL   nRBC 0.0 0.0 - 0.2 %   Neutrophils Relative % 76 %   Neutro Abs 12.8 (H) 1.7 - 7.7 K/uL   Lymphocytes Relative 16 %   Lymphs Abs 2.7 0.7 - 4.0 K/uL   Monocytes Relative 6 %   Monocytes Absolute 1.0 0.1 - 1.0 K/uL   Eosinophils Relative 1 %   Eosinophils Absolute 0.2 0.0 - 0.5 K/uL   Basophils Relative  0 %   Basophils Absolute 0.1 0.0 - 0.1 K/uL   Immature Granulocytes 1 %   Abs Immature Granulocytes 0.12 (H) 0.00 - 0.07 K/uL    Comment: Performed at [facility], [address], [place], [place] [address]  Comprehensive metabolic panel     Status: Abnormal   Collection Time: 10/19/23  6:32 PM  Result Value Ref Range   Sodium 137 135 - 145 mmol/L   Potassium 4.1 3.5 - 5.1 mmol/L   Chloride 93 (L) 98 - 111 mmol/L   CO2 32 22 - 32 mmol/L   Glucose, Bld 130 (H) 70 - 99 mg/dL    Comment: Glucose reference range applies only to samples taken after fasting for at least 8 hours.   BUN 17 8 - 23 mg/dL   Creatinine, Ser 8.99 0.44 - 1.00 mg/dL   Calcium  8.3 (L) 8.9 - 10.3 mg/dL   Total Protein 6.9 6.5 - 8.1 g/dL   Albumin  3.9 3.5 - 5.0 g/dL   AST 18 15 - 41 U/L   ALT 13 0 - 44 U/L   Alkaline Phosphatase 55 38 - 126 U/L   Total Bilirubin 0.3 0.0 - 1.2 mg/dL   GFR, Estimated >39 >39 mL/min    Comment: (NOTE) Calculated using the CKD-EPI Creatinine Equation (  2021)    Anion gap 12 5 - 15    Comment: Performed at [facility], [address] [place], [place] [address]  Magnesium      Status: None   Collection Time: 10/19/23  6:32 PM  Result Value Ref Range   Magnesium  2.1 1.7 - 2.4 mg/dL    Comment: Performed at [facility], [address] [place], [place] [address]  Brain natriuretic peptide     Status: None   Collection Time: 10/19/23  6:32 PM  Result Value Ref Range   B Natriuretic Peptide 31.7 0.0 - 100.0 pg/mL    Comment: Performed at [facility], [address], [place], [place] [address]  Urinalysis, Routine w reflex microscopic -Urine, Clean Catch     Status: Abnormal   Collection Time: 10/19/23  8:25 PM  Result Value Ref Range   Color, Urine STRAW (A) YELLOW   APPearance CLEAR CLEAR   Specific Gravity, Urine 1.006 1.005 - 1.030   pH 5.0 5.0 - 8.0   Glucose, UA >=500 (A) NEGATIVE mg/dL   Hgb urine dipstick NEGATIVE NEGATIVE   Bilirubin Urine  NEGATIVE NEGATIVE   Ketones, ur NEGATIVE NEGATIVE mg/dL   Protein, ur NEGATIVE NEGATIVE mg/dL   Nitrite NEGATIVE NEGATIVE   Leukocytes,Ua NEGATIVE NEGATIVE   RBC / HPF 0-5 0 - 5 RBC/hpf   WBC, UA 0-5 0 - 5 WBC/hpf   Bacteria, UA NONE SEEN NONE SEEN   Squamous Epithelial / HPF 0-5 0 - 5 /HPF   Mucus PRESENT     Comment: Performed at [facility], [address] [place], [place] [address]        Beverley Adine Hummer, MD, MS

## 2023-11-05 ENCOUNTER — Other Ambulatory Visit: Payer: Self-pay

## 2023-11-05 DIAGNOSIS — F419 Anxiety disorder, unspecified: Secondary | ICD-10-CM

## 2023-11-05 NOTE — Telephone Encounter (Signed)
 Copied from CRM [id number]. Topic: Clinical - Medication Question >> Nov 05, 2023  2:59 PM Chiquita SQUIBB wrote: Reason for CRM: Patient is calling in stating that her pharmacy has not received any of the medications that Dr. Sebastian was suppose to put in for her at her appointment on Friday.

## 2023-11-05 NOTE — Telephone Encounter (Signed)
 Called patient and left a voice message asking to give me a call back at the office.   Patient returned my call and I asked if she called her pharmacy to ask about what her Levothyroxine  dose is? She stated that we were the ones to call and confirmed the name of her pharmacy. I informed her that I will call the pharmacy and hopefully get the medications refilled today for her. She asked if I think the medications will be sent in today so she can pick them up. Informed her that I can not make that a promise and that I will do my best to get it in to her pharmacy today if I can. She thanked me for calling.

## 2023-11-05 NOTE — Telephone Encounter (Signed)
 Called patient's pharmacy and asked what is the dosage amount for Levothyroxine  and last refill date. Pharmacy has 125 mcg with last refill date of 09/27/23. He asked if we are patient's new PCP because patient has been calling them asking about her medications since this morning. I informed him that Dr. Sebastian is her PCP and we needed confirmation from them about the thyroid  medication before sending in the refills. He thanked me for calling

## 2023-11-06 ENCOUNTER — Other Ambulatory Visit: Payer: Self-pay | Admitting: Family Medicine

## 2023-11-06 DIAGNOSIS — J449 Chronic obstructive pulmonary disease, unspecified: Secondary | ICD-10-CM

## 2023-11-06 DIAGNOSIS — G4700 Insomnia, unspecified: Secondary | ICD-10-CM

## 2023-11-06 MED ORDER — CLOTRIMAZOLE 1 % EX CREA: TOPICAL_CREAM | Freq: Two times a day (BID) | CUTANEOUS | 0 refills | Status: DC

## 2023-11-06 MED ORDER — OMEPRAZOLE 20 MG PO CPDR: 40.0000 mg | DELAYED_RELEASE_CAPSULE | Freq: Every day | ORAL | 1 refills | Status: DC

## 2023-11-06 MED ORDER — CARVEDILOL 6.25 MG PO TABS: 6.2500 mg | ORAL_TABLET | Freq: Two times a day (BID) | ORAL | 11 refills | Status: DC

## 2023-11-06 MED ORDER — LEVOTHYROXINE SODIUM 125 MCG PO TABS: 125.0000 ug | ORAL_TABLET | Freq: Every day | ORAL | 0 refills | Status: DC

## 2023-11-06 MED ORDER — LEVOCETIRIZINE DIHYDROCHLORIDE 5 MG PO TABS: 5.0000 mg | ORAL_TABLET | Freq: Every day | ORAL | 0 refills | Status: DC

## 2023-11-06 MED ORDER — FARXIGA 10 MG PO TABS: 10.0000 mg | ORAL_TABLET | Freq: Every day | ORAL | 11 refills | Status: DC

## 2023-11-06 MED ORDER — DICYCLOMINE HCL 20 MG PO TABS: 20.0000 mg | ORAL_TABLET | Freq: Two times a day (BID) | ORAL | 0 refills | Status: DC

## 2023-11-06 MED ORDER — COLCHICINE 0.6 MG PO TABS: 0.6000 mg | ORAL_TABLET | Freq: Two times a day (BID) | ORAL | 0 refills | Status: DC

## 2023-11-06 MED ORDER — BUSPIRONE HCL 5 MG PO TABS
5.0000 mg | ORAL_TABLET | Freq: Three times a day (TID) | ORAL | 0 refills | Status: DC
Start: 2023-11-06 — End: 2023-12-05

## 2023-11-06 MED ORDER — UMECLIDINIUM-VILANTEROL 62.5-25 MCG/ACT IN AEPB: 1.0000 | INHALATION_SPRAY | Freq: Every day | RESPIRATORY_TRACT | 0 refills | Status: DC

## 2023-11-06 MED ORDER — TRAZODONE HCL 50 MG PO TABS: 100.0000 mg | ORAL_TABLET | Freq: Every day | ORAL | 0 refills | Status: DC

## 2023-11-06 NOTE — Addendum Note (Signed)
 Addended by: LENON ROUGHEN on: 11/06/2023 09:03 AM   Modules accepted: Orders

## 2023-11-06 NOTE — Telephone Encounter (Unsigned)
 Copied from CRM [id number]. Topic: Clinical - Medication Refill >> Nov 06, 2023  3:21 PM Macario HERO wrote: Medication: traZODone  (DESYREL ) 50 MG tablet [[id number]] umeclidinium-vilanterol (ANORO ELLIPTA ) 62.5-25 MCG/ACT AEPB [[id number]] carvedilol  (COREG ) 6.25 MG tablet [[id number]]  Has the patient contacted their pharmacy? No (Agent: If no, request that the patient contact the pharmacy for the refill. If patient does not wish to contact the pharmacy document the reason why and proceed with request.) (Agent: If yes, when and what did the pharmacy advise?)  This is the patient's preferred pharmacy:  [facility] - [place], [place] - [address] [place] [address] Phone: [phone] Fax: [phone]    Is this the correct pharmacy for this prescription? Yes If no, delete pharmacy and type the correct one.   Has the prescription been filled recently? Yes  Is the patient out of the medication? Yes  Has the patient been seen for an appointment in the last year OR does the patient have an upcoming appointment? Yes  Can we respond through MyChart? No  Agent: Please be advised that Rx refills may take up to 3 business days. We ask that you follow-up with your pharmacy.

## 2023-11-07 ENCOUNTER — Other Ambulatory Visit: Payer: Self-pay

## 2023-11-07 MED ORDER — FEROSUL 325 (65 FE) MG PO TABS: 325.0000 mg | ORAL_TABLET | Freq: Every day | ORAL | 1 refills | Status: DC

## 2023-11-07 MED ORDER — BUTALBITAL-APAP-CAFFEINE 50-325-40 MG PO TABS: 1.0000 | ORAL_TABLET | Freq: Four times a day (QID) | ORAL | 0 refills | Status: DC | PRN

## 2023-11-07 NOTE — Telephone Encounter (Signed)
 Called patient and she was advised that medications were sent to the pharmacy.  She is also needing the Iron and Fioricet  filled.   Please review and advise.  Thanks. Dm/cma

## 2023-11-07 NOTE — Telephone Encounter (Signed)
 Copied from CRM [id number]. Topic: Clinical - Medication Question >> Nov 07, 2023 12:03 PM Deaijah H wrote: Reason for CRM: Patient called in wanting her allergy medicine sent in with other prescriptions. Asked for name of script and was unable to provide.

## 2023-11-07 NOTE — Telephone Encounter (Signed)
 Forwarding message below. Please advise if refills are appropriate; orders are pending.

## 2023-11-10 ENCOUNTER — Other Ambulatory Visit: Payer: Self-pay | Admitting: Family Medicine

## 2023-11-12 ENCOUNTER — Other Ambulatory Visit: Payer: Self-pay | Admitting: Family Medicine

## 2023-11-13 DIAGNOSIS — K5904 Chronic idiopathic constipation: Secondary | ICD-10-CM | POA: Insufficient documentation

## 2023-11-13 DIAGNOSIS — E876 Hypokalemia: Secondary | ICD-10-CM | POA: Insufficient documentation

## 2023-11-13 DIAGNOSIS — E559 Vitamin D deficiency, unspecified: Secondary | ICD-10-CM | POA: Insufficient documentation

## 2023-11-13 IMAGING — CT CT ABD-PELV W/ CM
2 of 5 series · 16 of 46 positions shown, 18 images · IV contrast (omnipaque)
Comparison: Chest x-ray 03/09/2021, CT abdomen pelvis 10/01/2020,
CT chest 04/18/2020

CLINICAL DATA: Distension coughing up blood shortness of breath

EXAM:
CT ANGIOGRAPHY CHEST
CT ABDOMEN AND PELVIS WITH CONTRAST
TECHNIQUE: Multidetector CT imaging of the chest was performed using the
standard protocol during bolus administration of intravenous
contrast. Multiplanar CT image reconstructions and MIPs were
obtained to evaluate the vascular anatomy. Multidetector CT imaging
of the abdomen and pelvis was performed using the standard protocol
during bolus administration of intravenous contrast.
CONTRAST:  100mL OMNIPAQUE IOHEXOL 350 MG/ML SOLN

[Series 4: axial st · axial · 0.96mm/px · z∈[+1084,+1539]mm · 13 of 106 slices shown, 15 images]
[im 8/106  soft-tissue]
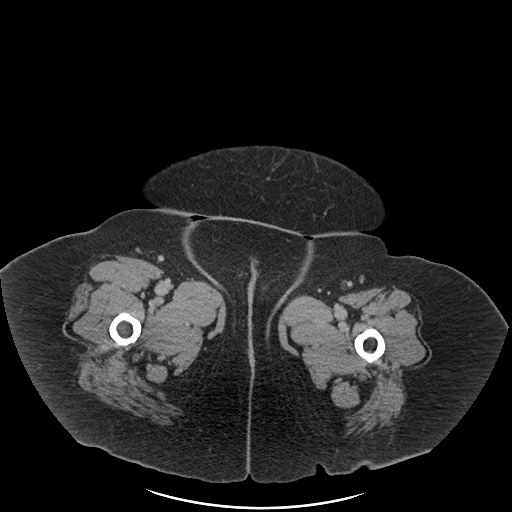
[im 8/106  bone]
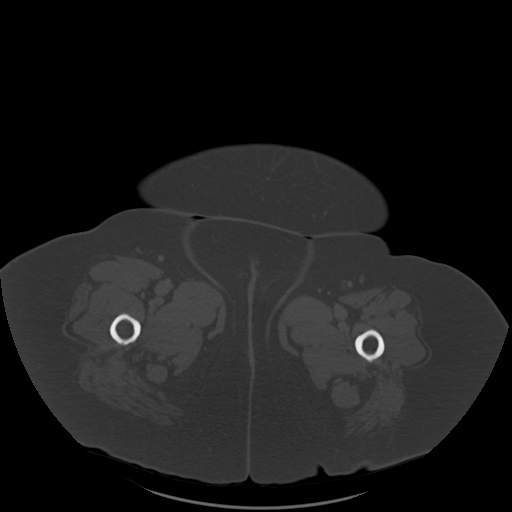
[im 15/106  soft-tissue]
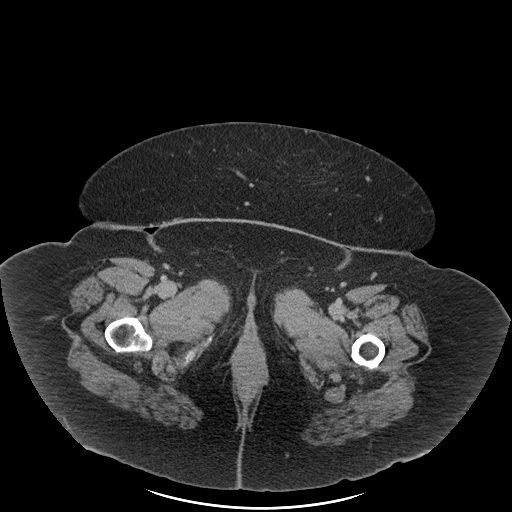
[im 22/106  soft-tissue]
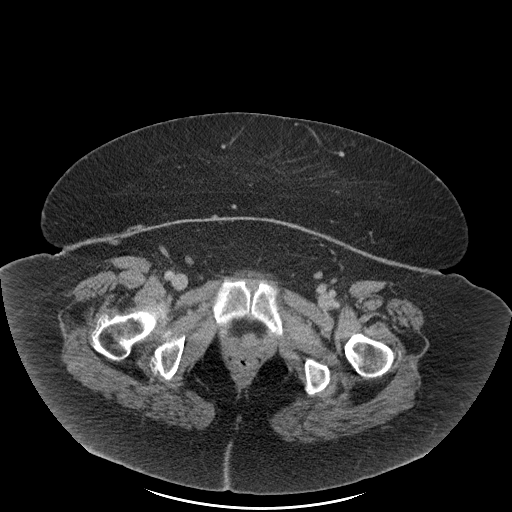
[im 29/106  soft-tissue]
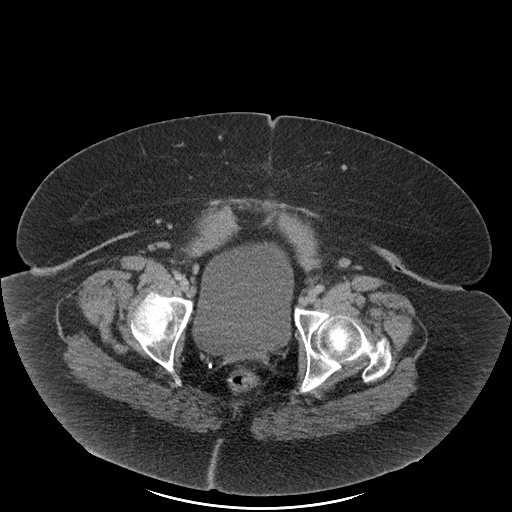
[im 36/106  soft-tissue]
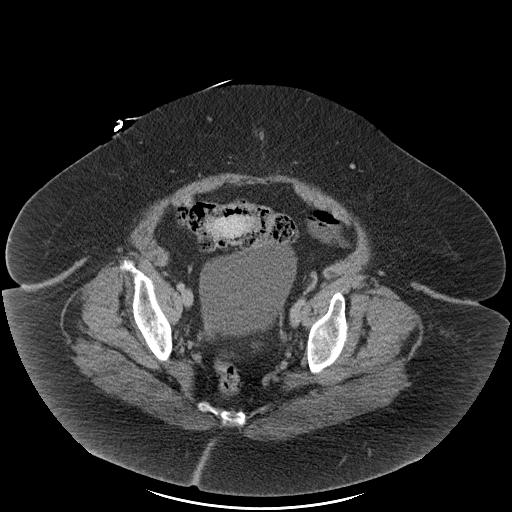
[im 43/106  soft-tissue]
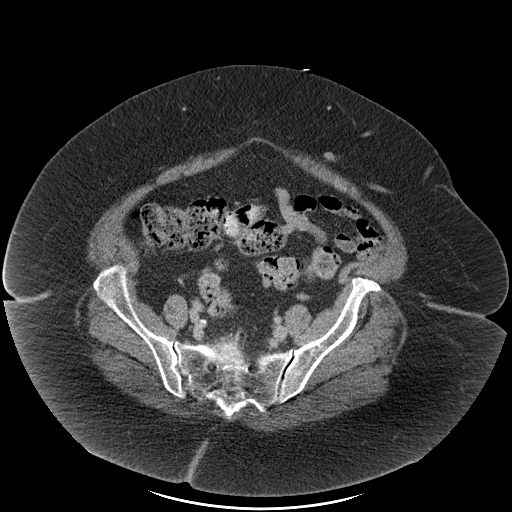
[im 57/106  soft-tissue]
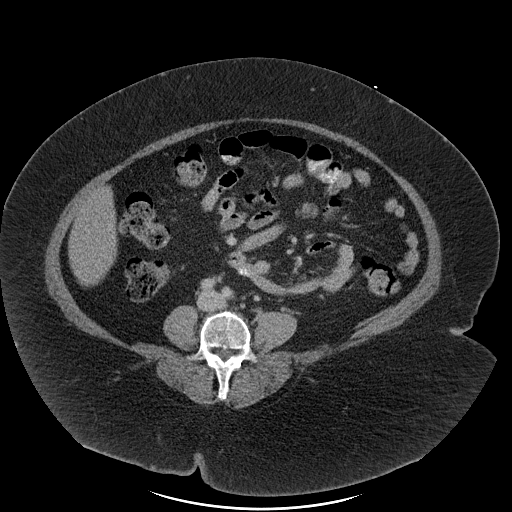
[im 64/106  soft-tissue]
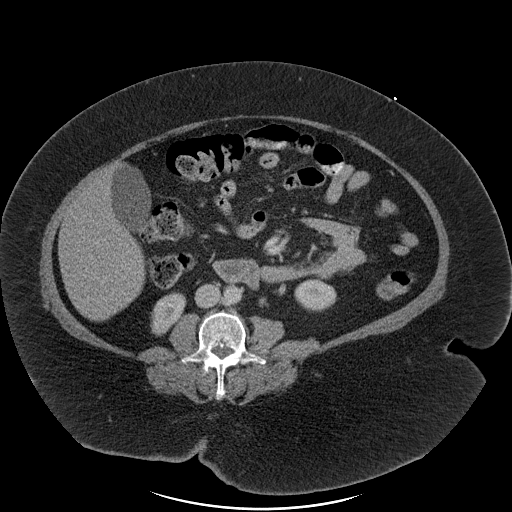
[im 71/106  soft-tissue]
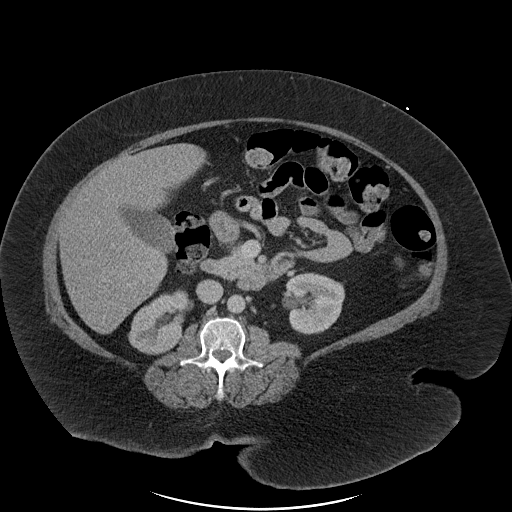
[im 71/106  bone]
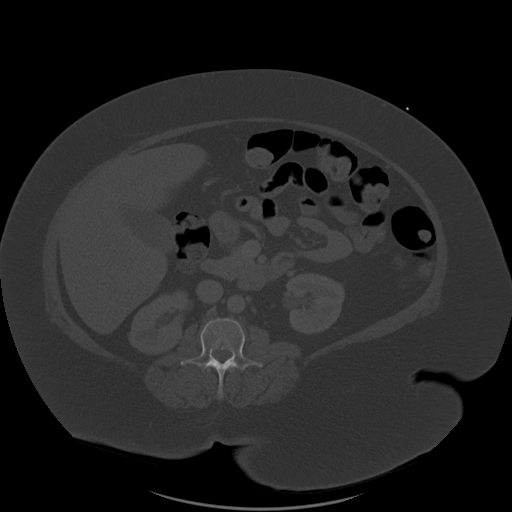
[im 78/106  soft-tissue]
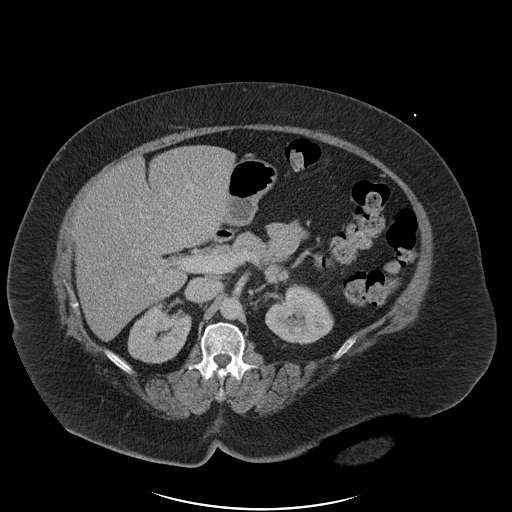
[im 85/106  soft-tissue]
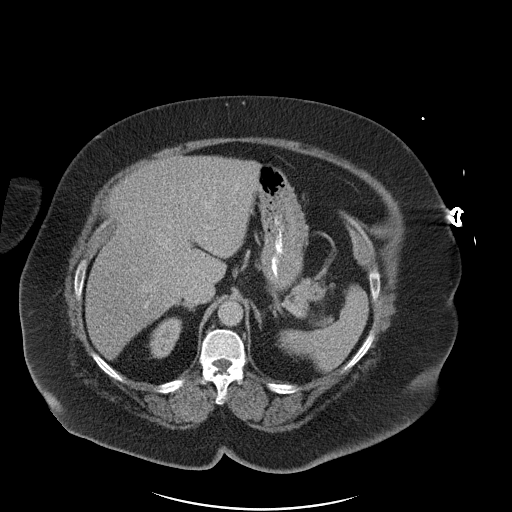
[im 92/106  soft-tissue]
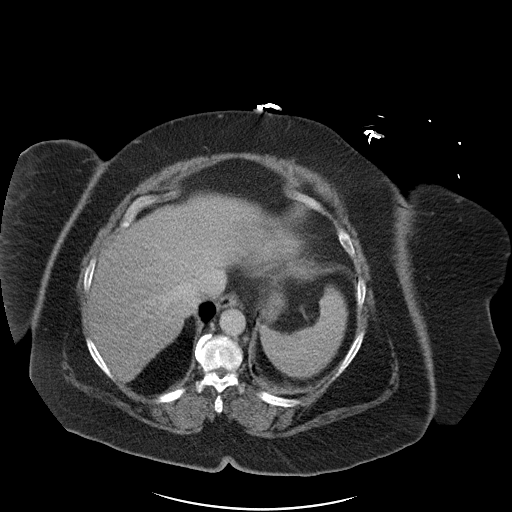
[im 99/106  soft-tissue]
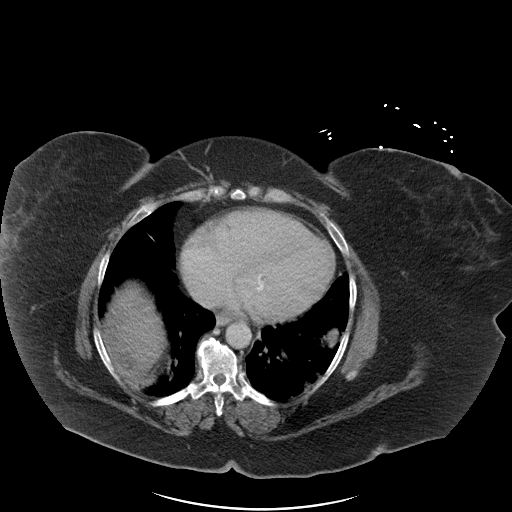

[Series 6: coronal st · coronal · 1.02mm/px · 3 of 128 slices shown]
[im 43/128  soft-tissue]
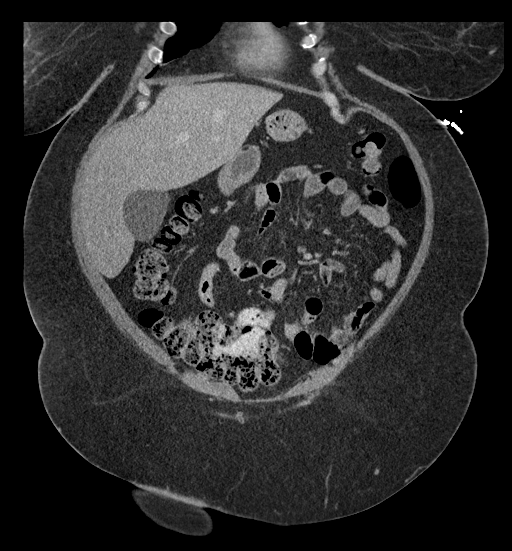
[im 57/128  soft-tissue]
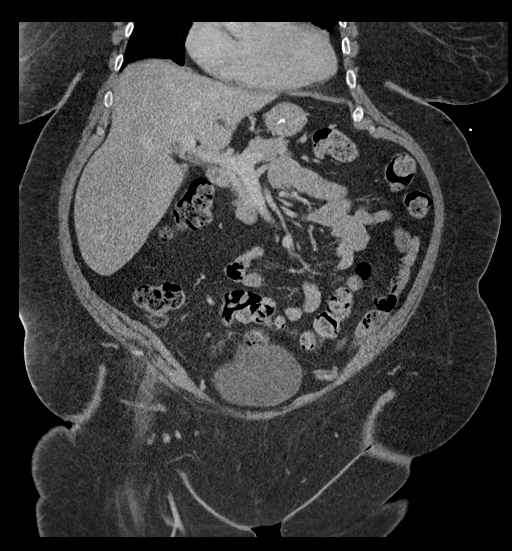
[im 71/128  soft-tissue]
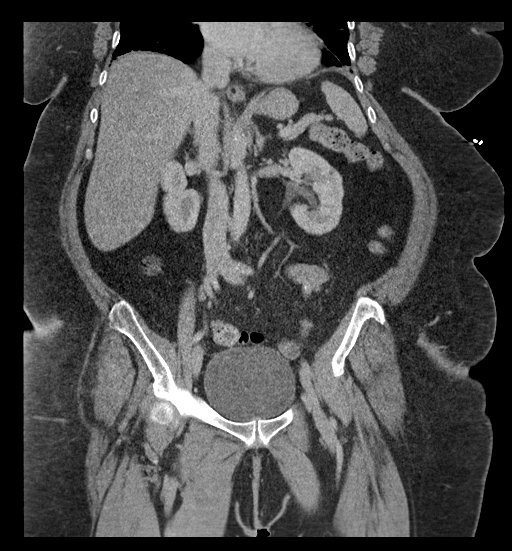

[16 of 46 positions shown; findings below may reference images not displayed]

FINDINGS: CTA CHEST FINDINGS

Cardiovascular: Satisfactory opacification of the pulmonary arteries
to the segmental level. Respiratory motion degradation limits
assessment for distal PE. No definite acute central embolus is seen.
Mild aortic atherosclerosis. No aneurysm. Mild cardiomegaly. No
pericardial effusion

Mediastinum/Nodes: Midline trachea. No thyroid mass. No suspicious
adenopathy. Esophagus within normal limits.

Lungs/Pleura: No pleural effusion or pneumothorax. Bandlike density
in the left lower lobe most likely represents atelectasis. Small
focus of subpleural consolidation within the left lower lobe.
Additional small subpleural consolidation in the right lower lobe
with patchy consolidation and ground-glass density in the right
upper lobe and perihilar region suspicious for pneumonia.

Musculoskeletal: Sternum is intact.  No acute osseous abnormality.

Review of the MIP images confirms the above findings.

CT ABDOMEN and PELVIS FINDINGS

Hepatobiliary: No calcified gallstone or biliary dilatation. Liver
enlargement up to 22 cm. Hepatic steatosis.

Pancreas: Unremarkable. No pancreatic ductal dilatation or
surrounding inflammatory changes.

Spleen: Normal in size without focal abnormality.

Adrenals/Urinary Tract: Adrenal glands are unremarkable. Kidneys are
normal, without renal calculi, focal lesion, or hydronephrosis.
Bladder is unremarkable.

Stomach/Bowel: Stomach is within normal limits. Appendix appears
normal. No evidence of bowel wall thickening, distention, or
inflammatory changes.

Vascular/Lymphatic: Mild aortic atherosclerosis. No aneurysm. No
suspicious nodes

Reproductive: Status post hysterectomy. No adnexal masses.

Other: Negative for pelvic effusion or free air

Musculoskeletal: No acute or significant osseous findings.

Review of the MIP images confirms the above findings.
IMPRESSION: 1. Motion degradation limits assessment for subsegmental PE. No
definite acute pulmonary embolus is seen.
2. Patchy subpleural, right perihilar and right upper lobe
consolidations and ground-glass densities suspect for pneumonia,
consider atypical or potential viral pneumonia.
3. No CT evidence for acute intra-abdominal or pelvic abnormality.
4. Enlarged liver with steatosis.

## 2023-11-13 IMAGING — CR DG CHEST 2V
2 series · 2 of 2 positions shown · non-contrast
Comparison: Radiographs 01/05/2021 and additional priors. CT
04/18/2020.

CLINICAL DATA: Shortness of breath. Coughing up blood
intermittently for months.

EXAM:
CHEST - 2 VIEW

[w chest lat]
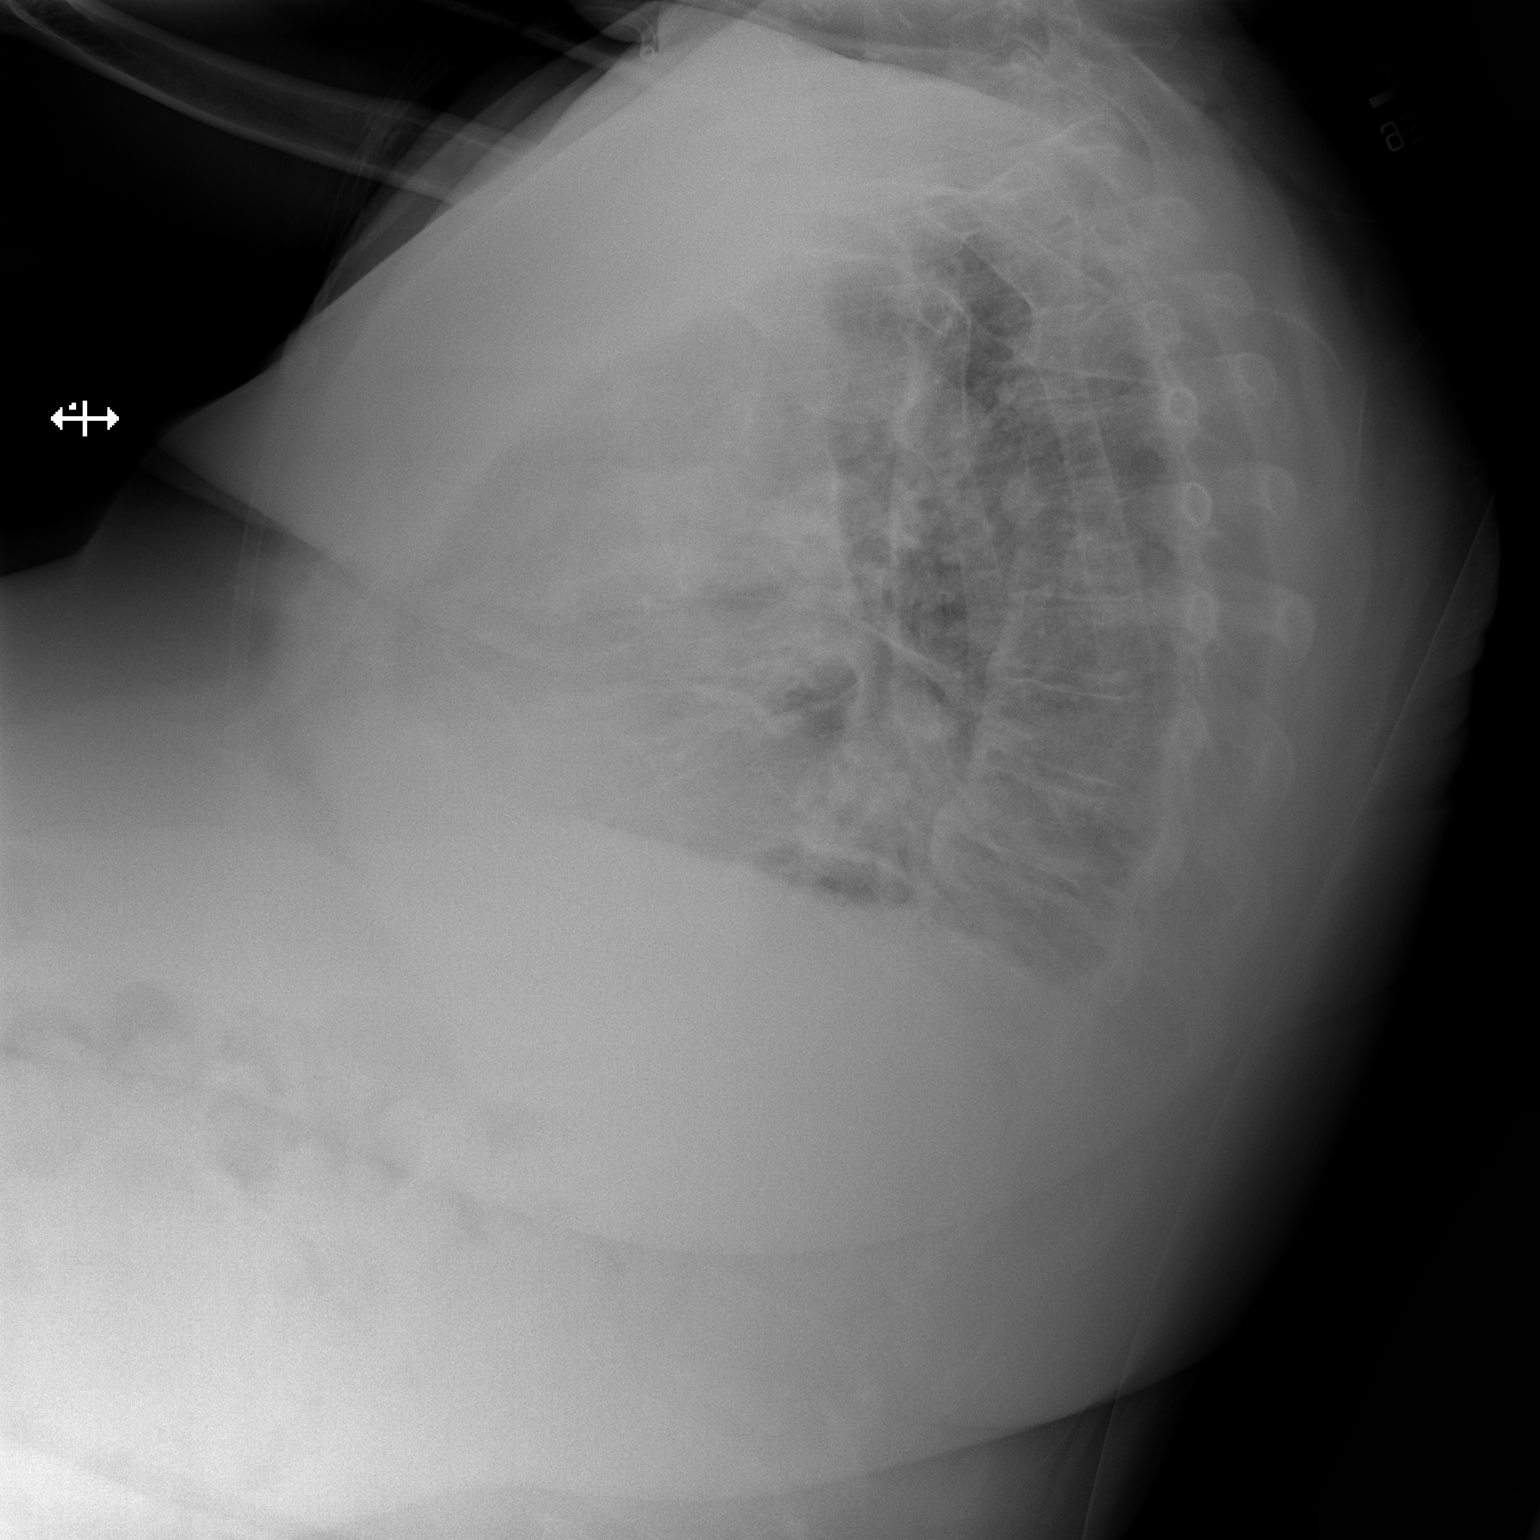

[x chest ap]
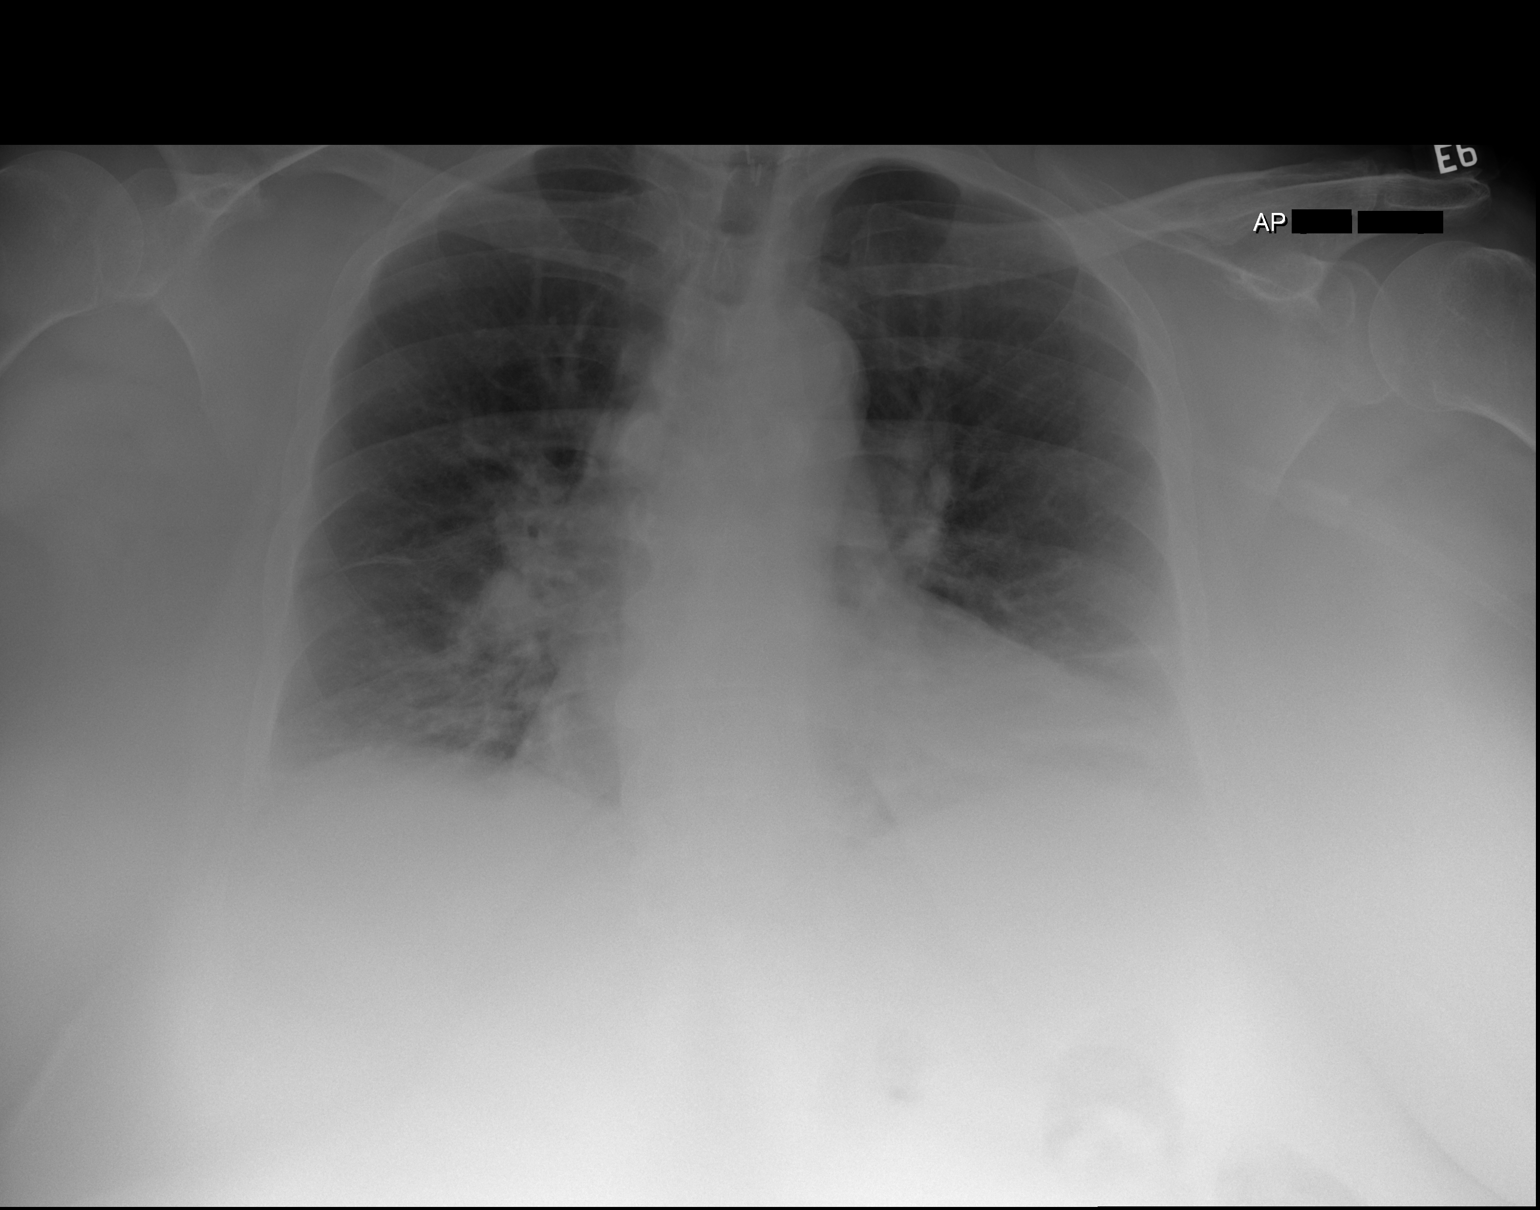

[2 of 2 positions shown; findings below may reference images not displayed]

FINDINGS: The heart size and mediastinal contours are stable. Surgical clips
at the thoracic inlet consistent with previous thyroidectomy. Stable
bilateral hilar prominence, central vascular congestion and
bibasilar airspace opacities. No significant pleural effusion or
pneumothorax. Degenerative changes are present throughout the spine.
IMPRESSION: Similar appearance of the chest compared with recent priors with
streaky bibasilar pulmonary opacities which likely represent
atelectasis or scarring. No definite acute process.

## 2023-11-13 IMAGING — CT CT ANGIO CHEST
2 of 6 series · 16 of 46 positions shown · IV contrast (omnipaque)
Comparison: Chest x-ray 03/09/2021, CT abdomen pelvis 10/01/2020,
CT chest 04/18/2020

CLINICAL DATA: Distension coughing up blood shortness of breath

EXAM:
CT ANGIOGRAPHY CHEST
CT ABDOMEN AND PELVIS WITH CONTRAST
TECHNIQUE: Multidetector CT imaging of the chest was performed using the
standard protocol during bolus administration of intravenous
contrast. Multiplanar CT image reconstructions and MIPs were
obtained to evaluate the vascular anatomy. Multidetector CT imaging
of the abdomen and pelvis was performed using the standard protocol
during bolus administration of intravenous contrast.
CONTRAST:  100mL OMNIPAQUE IOHEXOL 350 MG/ML SOLN

[Series 3: thins · axial · 0.83mm/px · z∈[+1445,+1699]mm · 13 of 280 slices shown]
[im 13/280  lung]
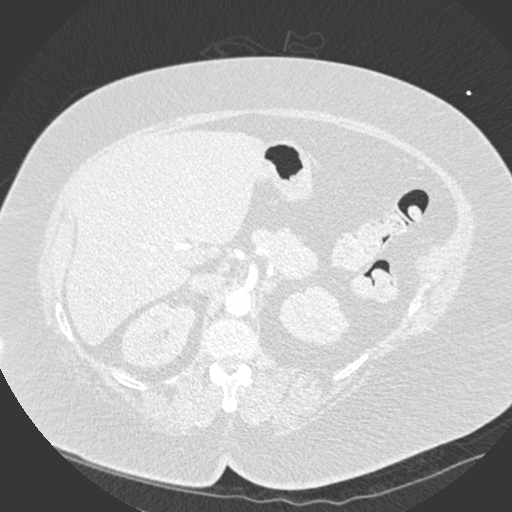
[im 37/280  soft-tissue]
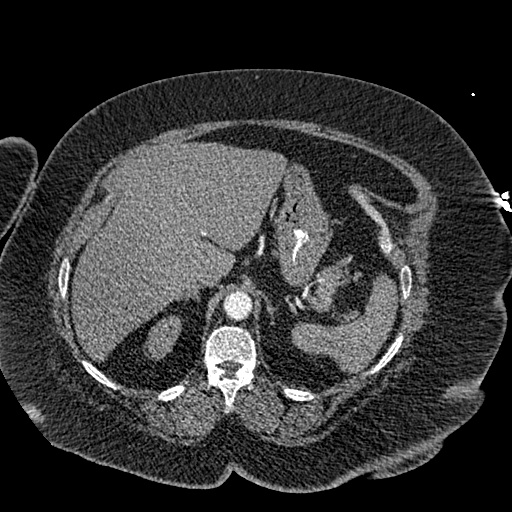
[im 61/280  lung]
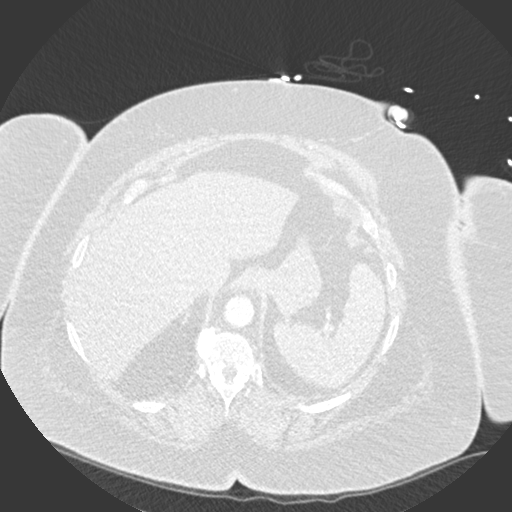
[im 73/280  soft-tissue]
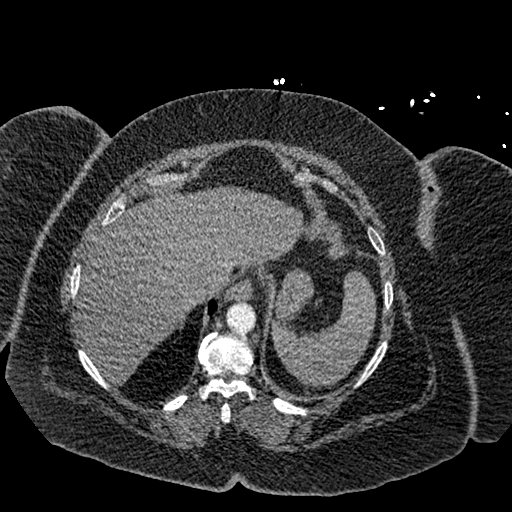
[im 98/280  lung]
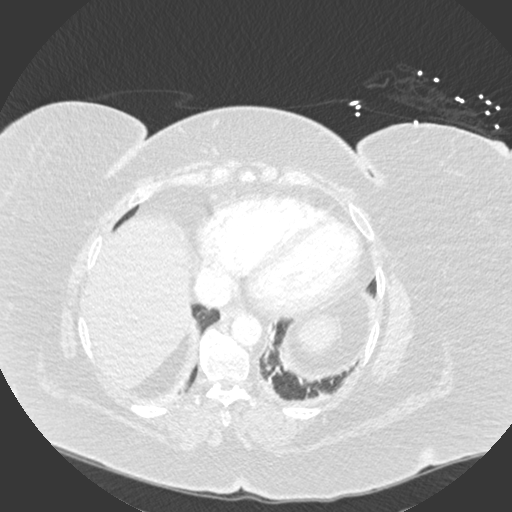
[im 122/280  soft-tissue]
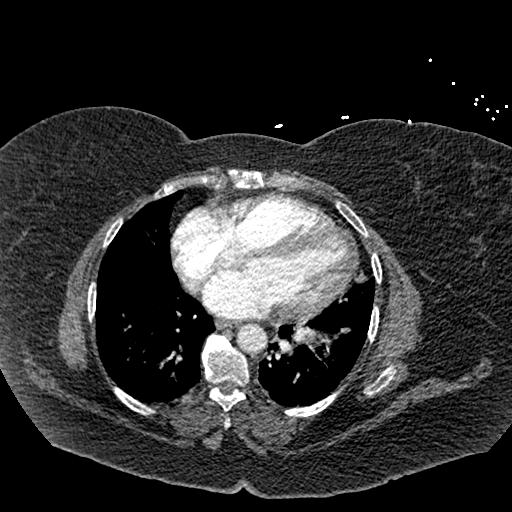
[im 146/280  lung]
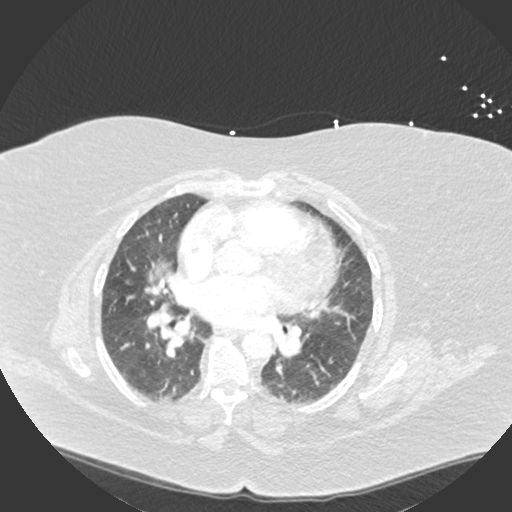
[im 158/280  soft-tissue]
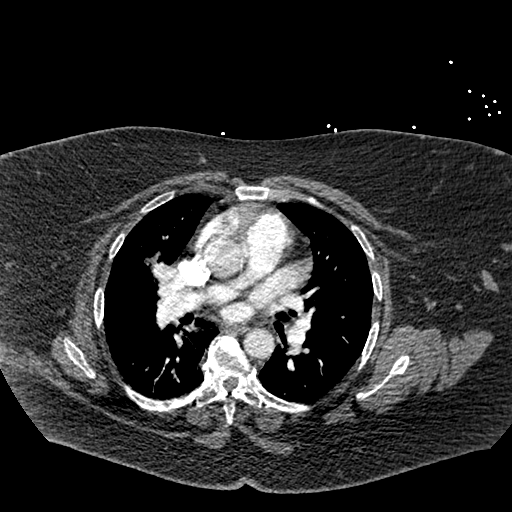
[im 182/280  lung]
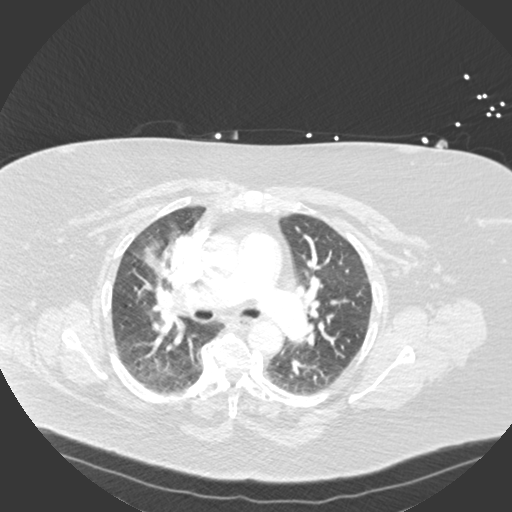
[im 207/280  soft-tissue]
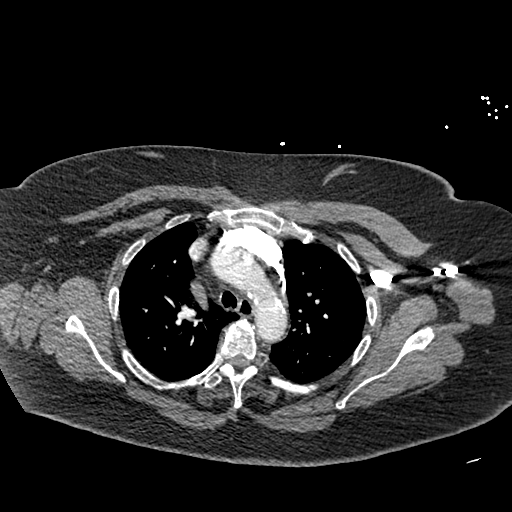
[im 219/280  lung]
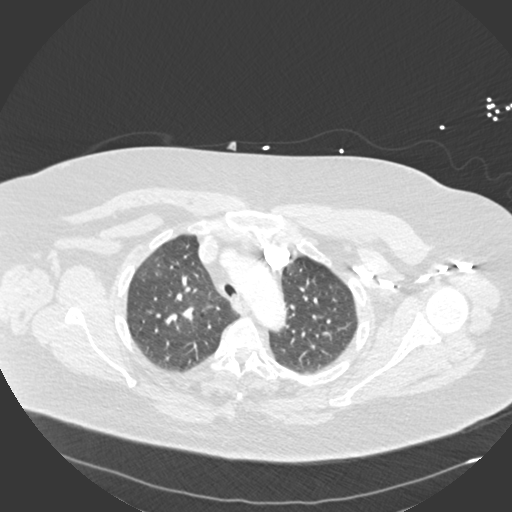
[im 243/280  soft-tissue]
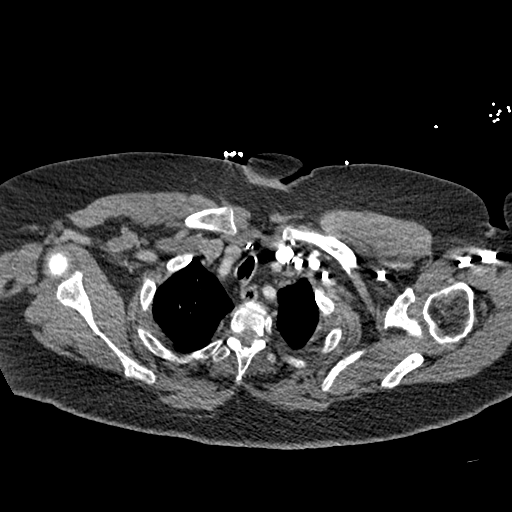
[im 267/280  lung]
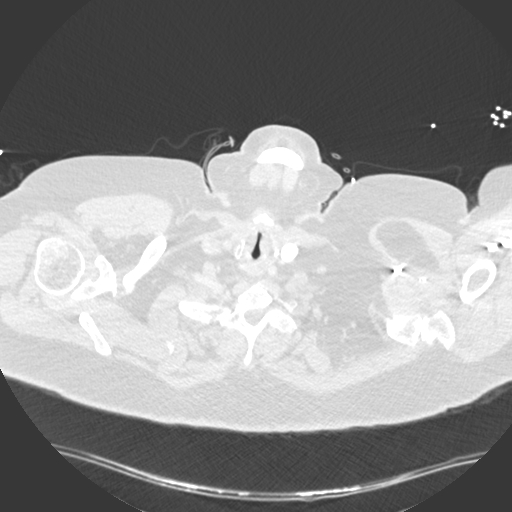

[Series 4: coronal mpr · coronal · 0.58mm/px · 3 of 163 slices shown]
[im 41/163  soft-tissue]
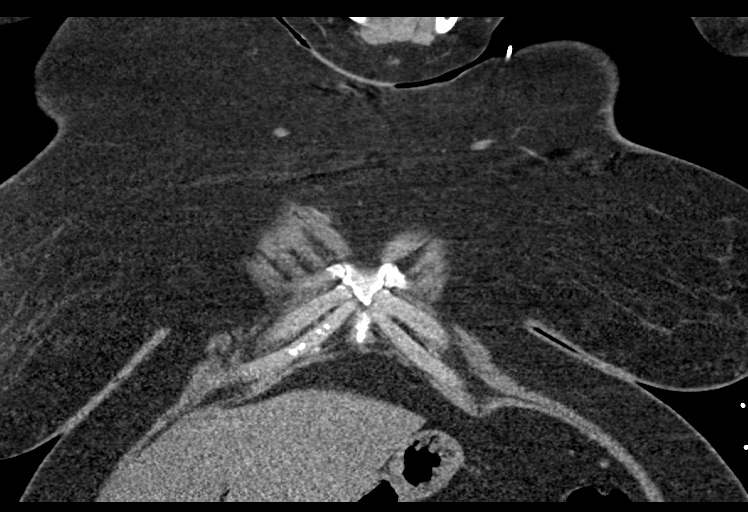
[im 82/163  soft-tissue]
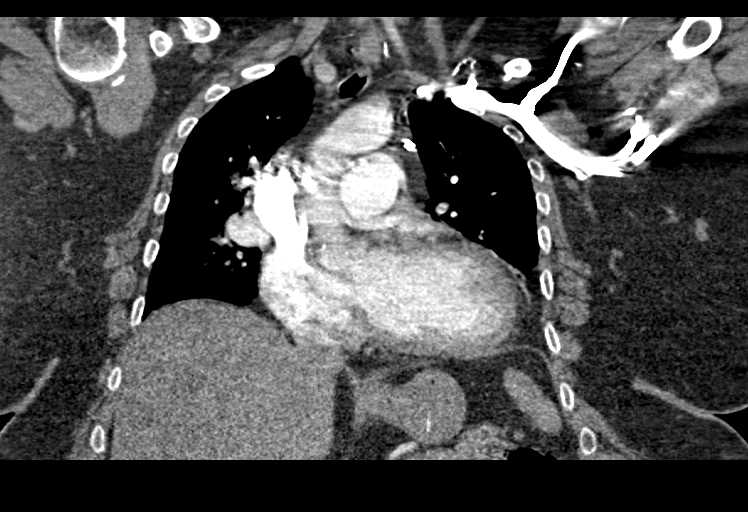
[im 122/163  soft-tissue]
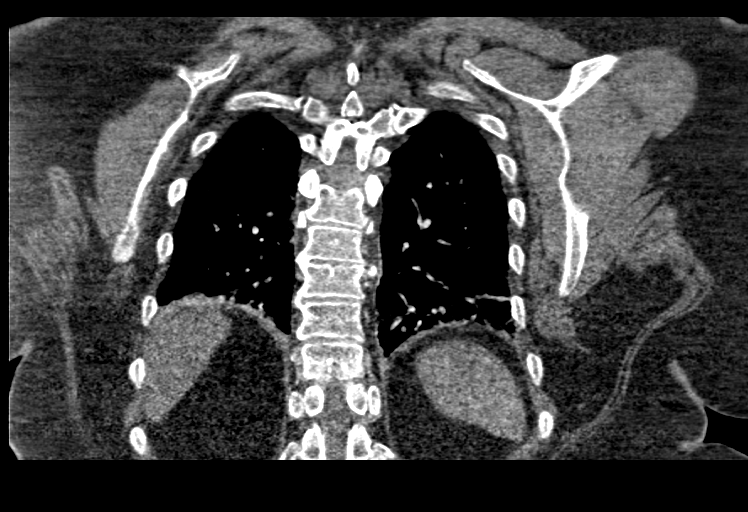

[16 of 46 positions shown; findings below may reference images not displayed]

FINDINGS: CTA CHEST FINDINGS

Cardiovascular: Satisfactory opacification of the pulmonary arteries
to the segmental level. Respiratory motion degradation limits
assessment for distal PE. No definite acute central embolus is seen.
Mild aortic atherosclerosis. No aneurysm. Mild cardiomegaly. No
pericardial effusion

Mediastinum/Nodes: Midline trachea. No thyroid mass. No suspicious
adenopathy. Esophagus within normal limits.

Lungs/Pleura: No pleural effusion or pneumothorax. Bandlike density
in the left lower lobe most likely represents atelectasis. Small
focus of subpleural consolidation within the left lower lobe.
Additional small subpleural consolidation in the right lower lobe
with patchy consolidation and ground-glass density in the right
upper lobe and perihilar region suspicious for pneumonia.

Musculoskeletal: Sternum is intact.  No acute osseous abnormality.

Review of the MIP images confirms the above findings.

CT ABDOMEN and PELVIS FINDINGS

Hepatobiliary: No calcified gallstone or biliary dilatation. Liver
enlargement up to 22 cm. Hepatic steatosis.

Pancreas: Unremarkable. No pancreatic ductal dilatation or
surrounding inflammatory changes.

Spleen: Normal in size without focal abnormality.

Adrenals/Urinary Tract: Adrenal glands are unremarkable. Kidneys are
normal, without renal calculi, focal lesion, or hydronephrosis.
Bladder is unremarkable.

Stomach/Bowel: Stomach is within normal limits. Appendix appears
normal. No evidence of bowel wall thickening, distention, or
inflammatory changes.

Vascular/Lymphatic: Mild aortic atherosclerosis. No aneurysm. No
suspicious nodes

Reproductive: Status post hysterectomy. No adnexal masses.

Other: Negative for pelvic effusion or free air

Musculoskeletal: No acute or significant osseous findings.

Review of the MIP images confirms the above findings.
IMPRESSION: 1. Motion degradation limits assessment for subsegmental PE. No
definite acute pulmonary embolus is seen.
2. Patchy subpleural, right perihilar and right upper lobe
consolidations and ground-glass densities suspect for pneumonia,
consider atypical or potential viral pneumonia.
3. No CT evidence for acute intra-abdominal or pelvic abnormality.
4. Enlarged liver with steatosis.

## 2023-11-13 MED ORDER — LANCETS MISC. MISC
1.0000 | Freq: Three times a day (TID) | 0 refills | Status: AC
Start: 2023-11-13 — End: 2023-12-13

## 2023-11-13 MED ORDER — ROSUVASTATIN CALCIUM 10 MG PO TABS: 10.0000 mg | ORAL_TABLET | Freq: Every evening | ORAL | 11 refills | Status: AC

## 2023-11-13 MED ORDER — SPIRONOLACTONE 25 MG PO TABS: 25.0000 mg | ORAL_TABLET | Freq: Every day | ORAL | 11 refills | Status: DC

## 2023-11-13 MED ORDER — BUDESONIDE 0.25 MG/2ML IN SUSP: 0.2500 mg | Freq: Two times a day (BID) | RESPIRATORY_TRACT | 0 refills | Status: DC

## 2023-11-13 MED ORDER — TORSEMIDE 20 MG PO TABS: 40.0000 mg | ORAL_TABLET | Freq: Two times a day (BID) | ORAL | 11 refills | Status: DC

## 2023-11-13 MED ORDER — VITAMIN D (ERGOCALCIFEROL) 1.25 MG (50000 UNIT) PO CAPS: 50000.0000 [IU] | ORAL_CAPSULE | ORAL | 0 refills | Status: AC

## 2023-11-13 MED ORDER — BLOOD GLUCOSE MONITORING SUPPL DEVI: 1.0000 | Freq: Three times a day (TID) | 0 refills | Status: DC

## 2023-11-13 MED ORDER — LOSARTAN POTASSIUM 25 MG PO TABS: 25.0000 mg | ORAL_TABLET | Freq: Every day | ORAL | 11 refills | Status: AC

## 2023-11-13 MED ORDER — BLOOD GLUCOSE TEST VI STRP: 1.0000 | ORAL_STRIP | Freq: Three times a day (TID) | 0 refills | Status: AC

## 2023-11-13 MED ORDER — ALLOPURINOL 100 MG PO TABS: 100.0000 mg | ORAL_TABLET | Freq: Every day | ORAL | 3 refills | Status: AC

## 2023-11-13 MED ORDER — ASPIRIN EC 81 MG PO TBEC: 81.0000 mg | DELAYED_RELEASE_TABLET | Freq: Every day | ORAL | 3 refills | Status: AC

## 2023-11-13 MED ORDER — POTASSIUM CHLORIDE ER 10 MEQ PO TBCR: 10.0000 meq | EXTENDED_RELEASE_TABLET | Freq: Two times a day (BID) | ORAL | 6 refills | Status: DC

## 2023-11-13 MED ORDER — POLYETHYLENE GLYCOL 3350 17 G PO PACK
17.0000 g | PACK | Freq: Every day | ORAL | 0 refills | Status: DC | PRN
Start: 2023-11-13 — End: 2023-12-05

## 2023-11-13 MED ORDER — LANCET DEVICE MISC: 1.0000 | Freq: Three times a day (TID) | 0 refills | Status: AC

## 2023-11-16 ENCOUNTER — Telehealth: Payer: Self-pay

## 2023-11-16 MED ORDER — LEVOCETIRIZINE DIHYDROCHLORIDE 5 MG PO TABS: 5.0000 mg | ORAL_TABLET | Freq: Every day | ORAL | 0 refills | Status: DC

## 2023-11-16 NOTE — Telephone Encounter (Signed)
 Copied from CRM [id number]. Topic: Clinical - Medication Question >> Nov 15, 2023 11:06 AM Chasity T wrote: Reason for CRM: Patient is calling regarding a missing a mouth piece for her breathing treatment. Please contact her back for full confirmation on what is needed.

## 2023-11-16 NOTE — Addendum Note (Signed)
 Addended by: ALESSANDRA DEDRA PARAS on: 11/16/2023 04:15 PM   Modules accepted: Orders

## 2023-11-16 NOTE — Telephone Encounter (Addendum)
 Pharmacy states the piece the pt is speaking of is purchased OTC, it is not a prescription. Pt notified. RF for levocetirizine (XYZAL ) 5 MG tablet sent to [facility] - [place], [place] - [address]

## 2023-11-16 NOTE — Telephone Encounter (Signed)
 Called patient to obtain more information and assist with concern; left VM for call back.

## 2023-11-16 NOTE — Telephone Encounter (Signed)
 Patient is returning call to [facility]. I advised her what the message was for and she is wanting you to call the pharmacy because they already know what is needed for the mouth piece of her inhaler needed. She also stated she needed allergy medication sent to the pharmacy. She just had a recent death in the family as of yesterday so she is not really by her phone alday but said it was okay for you to call her back. Just prefer you call the pharmacy [address] for any questions.

## 2023-11-20 IMAGING — CT CT RENAL STONE PROTOCOL
2 of 4 series · 17 of 46 positions shown, 19 images · non-contrast
Comparison: CTA chest and CT abdomen and pelvis 03/09/2021

CLINICAL DATA: Flank pain, kidney stone suspected.

EXAM:
CT ABDOMEN AND PELVIS WITHOUT CONTRAST
TECHNIQUE: Multidetector CT imaging of the abdomen and pelvis was performed
following the standard protocol without IV contrast.

[Series 2: stone full · axial · 0.90mm/px · z∈[+712,+1156]mm · 14 of 97 slices shown, 16 images]
[im 4/97  soft-tissue]
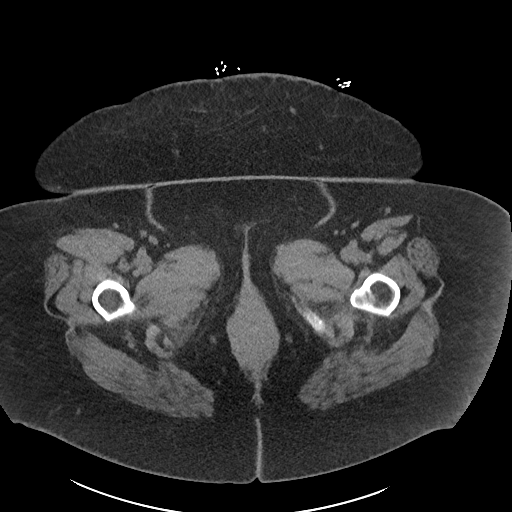
[im 4/97  bone]
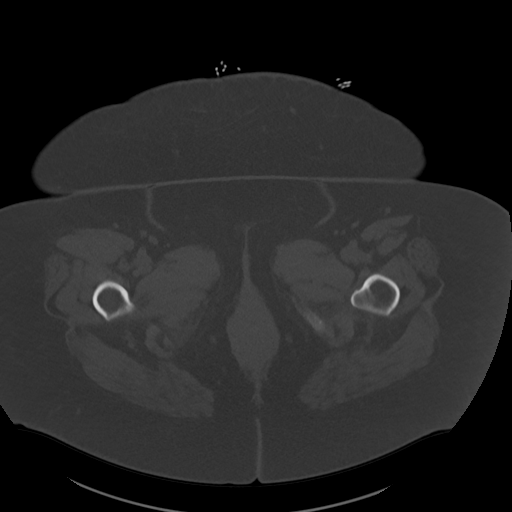
[im 12/97  soft-tissue]
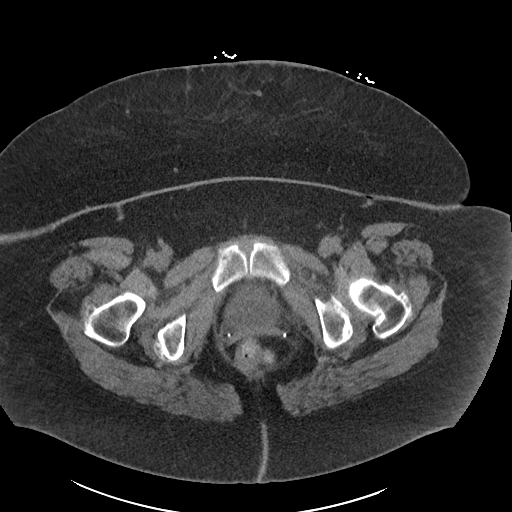
[im 20/97  soft-tissue]
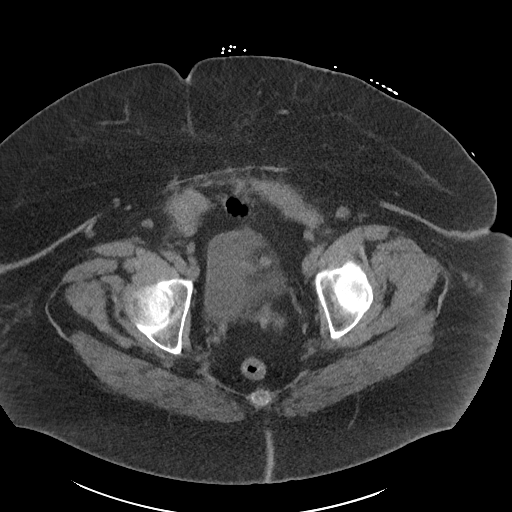
[im 27/97  soft-tissue]
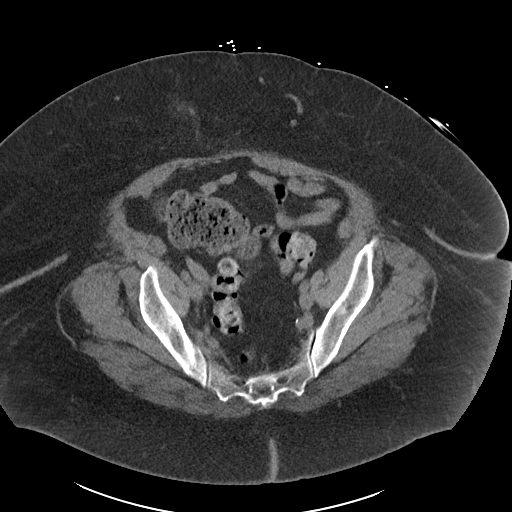
[im 31/97  soft-tissue]
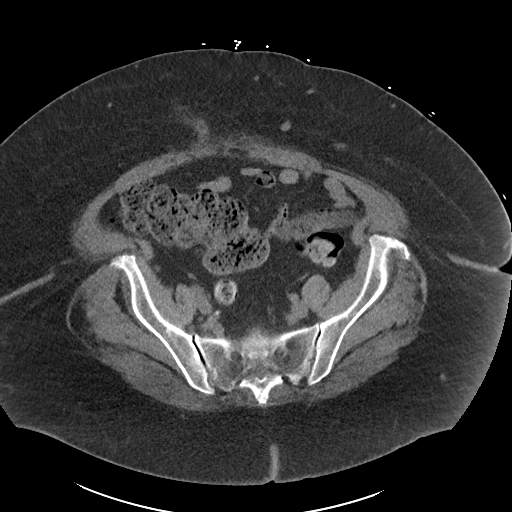
[im 39/97  soft-tissue]
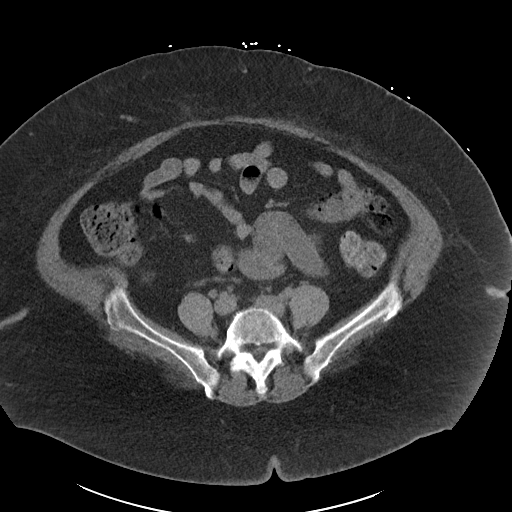
[im 47/97  soft-tissue]
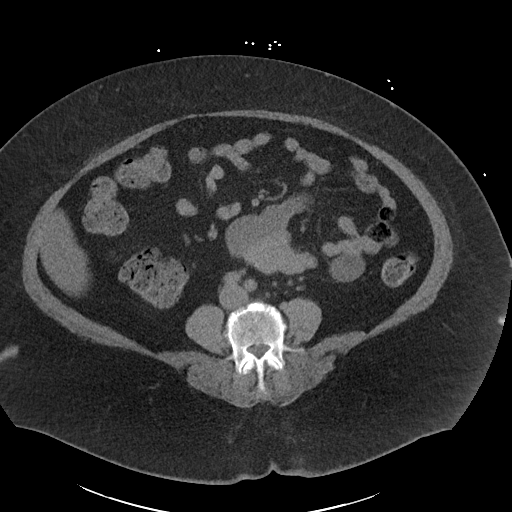
[im 50/97  soft-tissue]
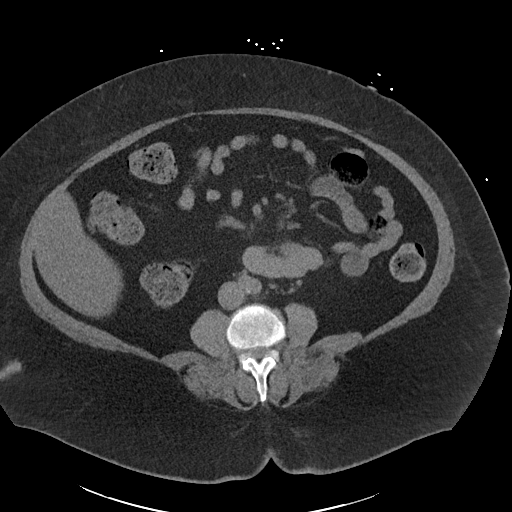
[im 58/97  soft-tissue]
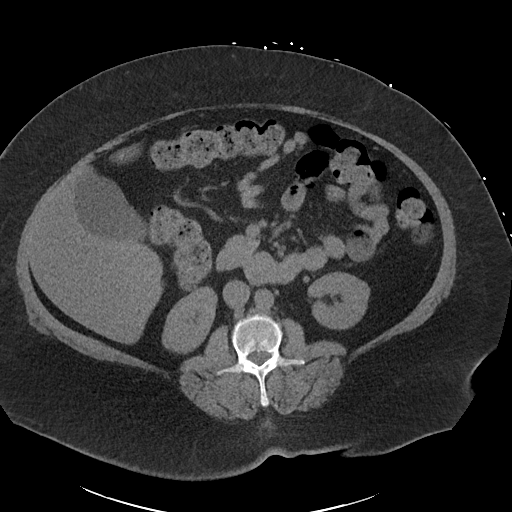
[im 58/97  bone]
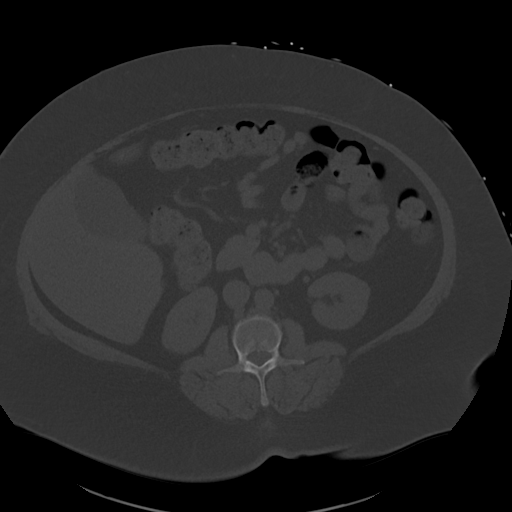
[im 66/97  soft-tissue]
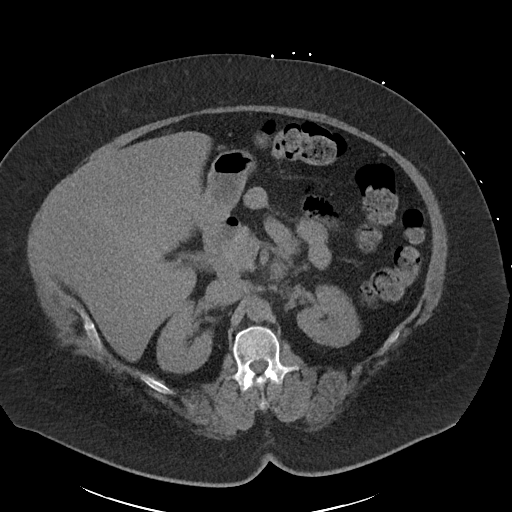
[im 73/97  soft-tissue]
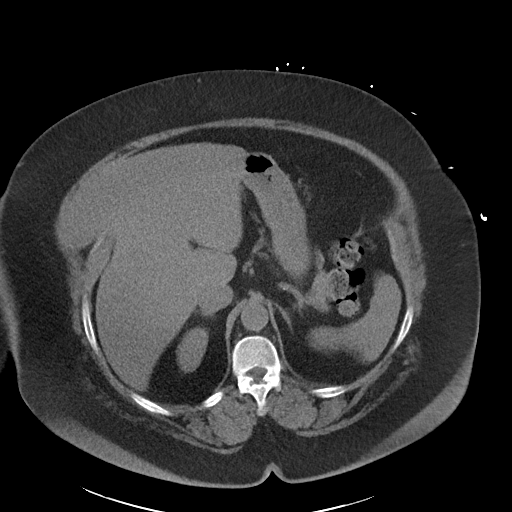
[im 77/97  soft-tissue]
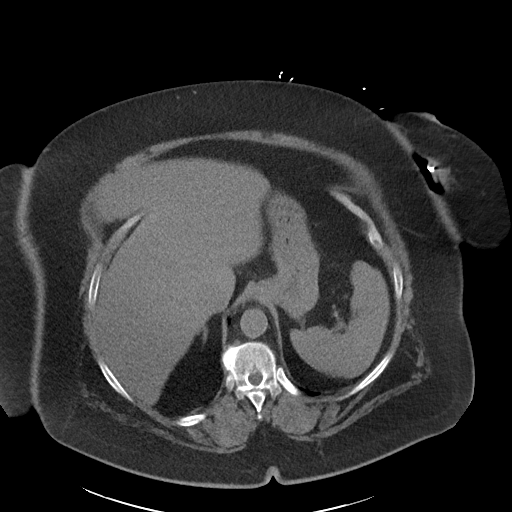
[im 85/97  soft-tissue]
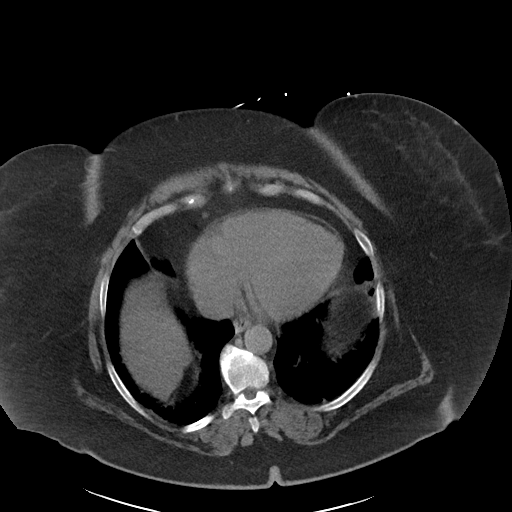
[im 93/97  soft-tissue]
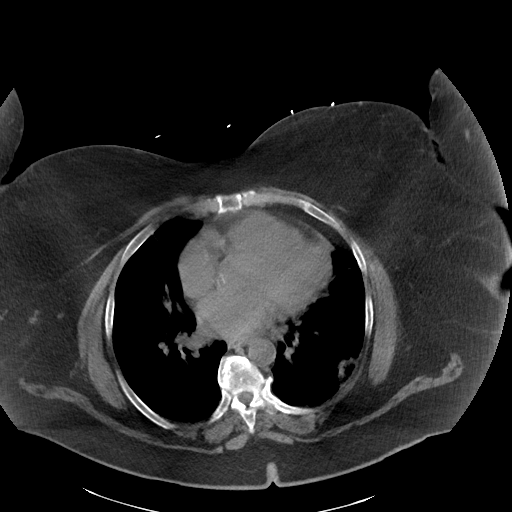

[Series 5: coronal · coronal · 0.96mm/px · 3 of 144 slices shown]
[im 48/144  soft-tissue]
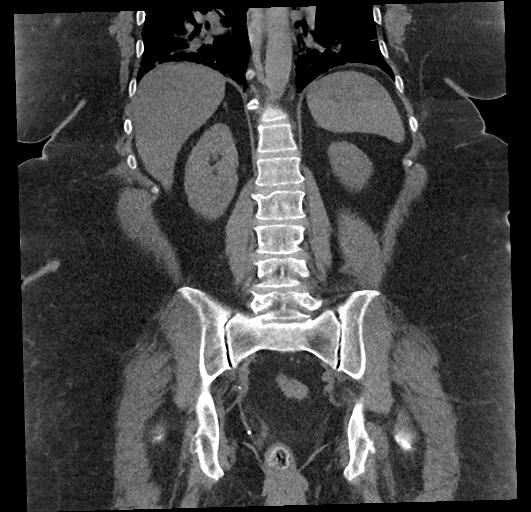
[im 64/144  soft-tissue]
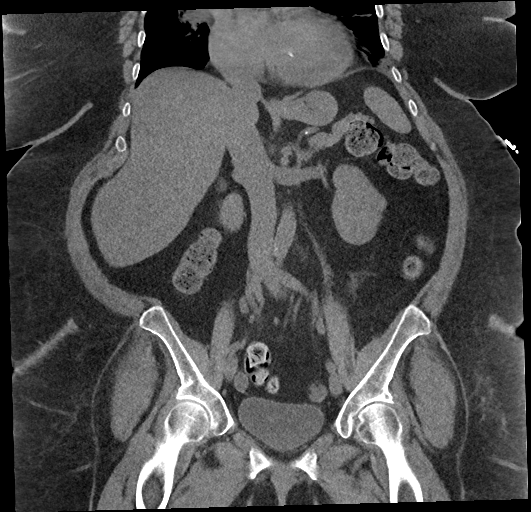
[im 80/144  soft-tissue]
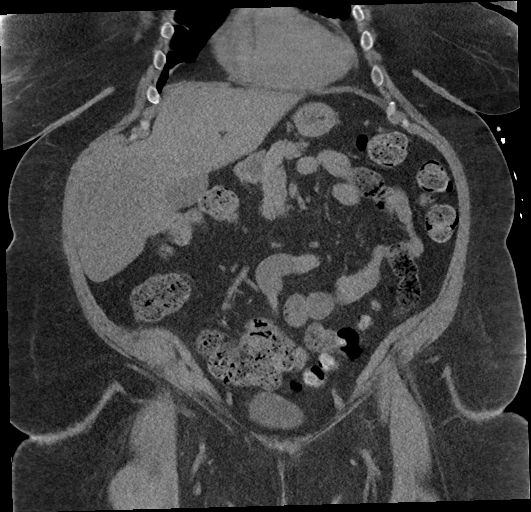

[17 of 46 positions shown; findings below may reference images not displayed]

FINDINGS: Lower chest: Partially visualized patchy consolidation in both lung
bases, similar to the recent prior chest CTA. No pleural effusion.

Hepatobiliary: Hepatomegaly and hepatic steatosis. Unremarkable
gallbladder. No biliary dilatation.

Pancreas: Unremarkable.

Spleen: Unremarkable.

Adrenals/Urinary Tract: Unremarkable adrenal glands. No evidence of
renal mass, calculi, or hydronephrosis. Unremarkable bladder.

Stomach/Bowel: The stomach is unremarkable. There is no evidence of
bowel obstruction or inflammation. The appendix is unremarkable.

Vascular/Lymphatic: Mild abdominal aortic atherosclerosis without
aneurysm. No enlarged lymph nodes.

Reproductive: Status post hysterectomy. Grossly unremarkable
ovaries.

Other: No ascites or pneumoperitoneum.

Musculoskeletal: No acute osseous abnormality or suspicious osseous
lesion.
IMPRESSION: 1. No evidence of urinary tract calculi, obstruction, or other acute
abnormality in the abdomen or pelvis.
2. Hepatomegaly and hepatic steatosis.
3. Persistent patchy consolidation in the lung bases.
4. Aortic Atherosclerosis ([id number]-[id number].P).

## 2023-11-22 ENCOUNTER — Ambulatory Visit (HOSPITAL_COMMUNITY): Payer: Self-pay | Admitting: Cardiology

## 2023-11-22 ENCOUNTER — Telehealth: Payer: Self-pay

## 2023-11-22 ENCOUNTER — Encounter (HOSPITAL_COMMUNITY): Payer: Self-pay

## 2023-11-22 ENCOUNTER — Ambulatory Visit (HOSPITAL_COMMUNITY)
Admission: RE | Admit: 2023-11-22 | Discharge: 2023-11-22 | Disposition: A | Discharge status: Home / Self Care | Source: Ambulatory Visit | Attending: Cardiology | Admitting: Cardiology

## 2023-11-22 ENCOUNTER — Encounter (HOSPITAL_COMMUNITY): Payer: Self-pay | Admitting: Cardiology

## 2023-11-22 VITALS — BP 126/70 | HR 84 | Ht 65.0 in | Wt 310.4 lb

## 2023-11-22 DIAGNOSIS — Z79899 Other long term (current) drug therapy: Secondary | ICD-10-CM | POA: Diagnosis not present

## 2023-11-22 DIAGNOSIS — J4489 Other specified chronic obstructive pulmonary disease: Secondary | ICD-10-CM | POA: Insufficient documentation

## 2023-11-22 DIAGNOSIS — I110 Hypertensive heart disease with heart failure: Secondary | ICD-10-CM | POA: Insufficient documentation

## 2023-11-22 DIAGNOSIS — Z7982 Long term (current) use of aspirin: Secondary | ICD-10-CM | POA: Diagnosis not present

## 2023-11-22 DIAGNOSIS — G4733 Obstructive sleep apnea (adult) (pediatric): Secondary | ICD-10-CM | POA: Diagnosis not present

## 2023-11-22 DIAGNOSIS — I5032 Chronic diastolic (congestive) heart failure: Secondary | ICD-10-CM | POA: Insufficient documentation

## 2023-11-22 DIAGNOSIS — C73 Malignant neoplasm of thyroid gland: Secondary | ICD-10-CM | POA: Insufficient documentation

## 2023-11-22 DIAGNOSIS — I471 Supraventricular tachycardia, unspecified: Secondary | ICD-10-CM | POA: Insufficient documentation

## 2023-11-22 DIAGNOSIS — E669 Obesity, unspecified: Secondary | ICD-10-CM | POA: Insufficient documentation

## 2023-11-22 DIAGNOSIS — Z7984 Long term (current) use of oral hypoglycemic drugs: Secondary | ICD-10-CM | POA: Insufficient documentation

## 2023-11-22 DIAGNOSIS — Z79890 Hormone replacement therapy (postmenopausal): Secondary | ICD-10-CM | POA: Diagnosis not present

## 2023-11-22 DIAGNOSIS — Z6841 Body Mass Index (BMI) 40.0 and over, adult: Secondary | ICD-10-CM | POA: Insufficient documentation

## 2023-11-22 DIAGNOSIS — Z7951 Long term (current) use of inhaled steroids: Secondary | ICD-10-CM | POA: Diagnosis not present

## 2023-11-22 DIAGNOSIS — Z602 Problems related to living alone: Secondary | ICD-10-CM | POA: Diagnosis not present

## 2023-11-22 DIAGNOSIS — Z139 Encounter for screening, unspecified: Secondary | ICD-10-CM

## 2023-11-22 DIAGNOSIS — F419 Anxiety disorder, unspecified: Secondary | ICD-10-CM

## 2023-11-22 DIAGNOSIS — E119 Type 2 diabetes mellitus without complications: Secondary | ICD-10-CM | POA: Diagnosis not present

## 2023-11-22 DIAGNOSIS — J9611 Chronic respiratory failure with hypoxia: Secondary | ICD-10-CM | POA: Diagnosis not present

## 2023-11-22 DIAGNOSIS — I10 Essential (primary) hypertension: Secondary | ICD-10-CM

## 2023-11-22 DIAGNOSIS — R634 Abnormal weight loss: Secondary | ICD-10-CM | POA: Diagnosis not present

## 2023-11-22 DIAGNOSIS — E785 Hyperlipidemia, unspecified: Secondary | ICD-10-CM | POA: Diagnosis not present

## 2023-11-22 DIAGNOSIS — E042 Nontoxic multinodular goiter: Secondary | ICD-10-CM | POA: Diagnosis not present

## 2023-11-22 DIAGNOSIS — Z87891 Personal history of nicotine dependence: Secondary | ICD-10-CM | POA: Diagnosis not present

## 2023-11-22 LAB — BASIC METABOLIC PANEL WITH GFR
Anion gap: 13 (ref 5–15)
BUN: 17 mg/dL (ref 8–23)
CO2: 33 mmol/L — ABNORMAL HIGH (ref 22–32)
Calcium: 9.3 mg/dL (ref 8.9–10.3)
Chloride: 94 mmol/L — ABNORMAL LOW (ref 98–111)
Creatinine, Ser: 0.98 mg/dL (ref 0.44–1.00)
GFR, Estimated: >60 mL/min (ref >60)
Glucose, Bld: 129 mg/dL — ABNORMAL HIGH (ref 70–99)
Potassium: 4.2 mmol/L (ref 3.5–5.1)
Sodium: 140 mmol/L (ref 135–145)

## 2023-11-22 LAB — BRAIN NATRIURETIC PEPTIDE: B Natriuretic Peptide: 13.6 pg/mL (ref 0.0–100.0)

## 2023-11-22 LAB — T4, FREE: Free T4: 0.78 ng/dL (ref 0.61–1.12)

## 2023-11-22 LAB — TSH: TSH: 39.047 u[IU]/mL — ABNORMAL HIGH (ref 0.350–4.500)

## 2023-11-22 NOTE — Patient Instructions (Addendum)
 Good to see you today!   Labs done today, your results will be available in MyChart, we will contact you for abnormal readings.  Your physician has requested that you have an echocardiogram. Echocardiography is a painless test that uses sound waves to create images of your heart. It provides your doctor with information about the size and shape of your heart and how well your heart's chambers and valves are working. This procedure takes approximately one hour. There are no restrictions for this procedure. Please do NOT wear cologne, perfume, aftershave, or lotions (deodorant is allowed). Please arrive 15 minutes prior to your appointment time.  Please note: We ask at that you not bring children with you during ultrasound (echo/ vascular) testing. Due to room size and safety concerns, children are not allowed in the ultrasound rooms during exams. Our front office staff cannot provide observation of children in our lobby area while testing is being conducted. An adult accompanying a patient to their appointment will only be allowed in the ultrasound room at the discretion of the ultrasound technician under special circumstances. We apologize for any inconvenience.  Your physician recommends that you schedule a follow-up appointment 3 months with echocardiogram(November) Call office in September to schedule an appointment   If you have any questions or concerns before your next appointment please send us  a message through [place] or call our office at [phone].    TO LEAVE A MESSAGE FOR THE NURSE SELECT OPTION 2, PLEASE LEAVE A MESSAGE INCLUDING: YOUR NAME DATE OF BIRTH CALL BACK NUMBER REASON FOR CALL**this is important as we prioritize the call backs  YOU WILL RECEIVE A CALL BACK THE SAME DAY AS LONG AS YOU CALL BEFORE 4:00 PM At the [facility], you and your health needs are our priority. As part of our continuing mission to provide you with exceptional heart care, we have  created designated Provider Care Teams. These Care Teams include your primary Cardiologist (physician) and Advanced Practice Providers (APPs- Physician Assistants and Nurse Practitioners) who all work together to provide you with the care you need, when you need it.   You may see any of the following providers on your designated Care Team at your next follow up: Dr Toribio Fuel Dr Ezra Shuck Dr. Ria Commander Dr. Morene Brownie Amy Lenetta, NP Caffie Shed, [place] [facility] [place], [place] Beckey Coe, NP [place] Lee, NP Ellouise Class, NP Tinnie Redman, PharmD Jaun Bash, PharmD   Please be sure to bring in all your medications bottles to every appointment.    Thank you for choosing [facility]

## 2023-11-22 NOTE — Progress Notes (Signed)
 Primary Care: [name], MD HF Cardiology: Dr. Rolan  HPI: Mrs Hoiland is a 63 y.o. with a history of  HTN, obesity, OSA, COPD, hyperlipidemia, diastolic CHF, gout, bipolar, and SVT.   Admitted in 8/16 with diastolic CHF and COPD exacerbation.  She was admitted in 1/17 with COPD exacerbation.  She was set up for a Cardiolite  in 2/17.  This showed a small, moderate intensity partially reversible inferolateral defect.  LHC/RHC in 3/17 showed no significant CAD, mildly elevated PCWP.  PFTs in 3/17 showed severe COPD.     Admitted 10/15-10/19/17 with AECOPD and A/C diastolic CHF.  Diuresed with IV lasix . Completed course for CAP. Pt also noted have multinodular goiter on US .  Biopsy was done finally in 3/18, showing papillary thyroid  carcinoma.    Echo in 4/18 showed EF 45-50%, moderate dilated RV with moderately decreased systolic function.    She was admitted again in 4/18 with COPD exacerbation, acute on chronic diastolic CHF, and CAP.     She was admitted in 3/20 with acute on chronic diastolic CHF and treated with IV Lasix .  Echo showed EF 60-65%, mild RV enlargement with normal function, respirophasic septal variation.      Echo in 5/21 showed LVEF 55-60%, RV normal, RVSP was normal.  RHC in 6/21 showed mean RA 11, PA 44/14 mean 27, mean PCWP 15, CI 4.6, PVR 1.1.   Echo in 2/24 showed EF 55-60%, mild LVH, mildly D-shaped septum.    Admitted 9/24 with somnolence/hypoxemia in the setting of hypercapnia likely from polypharmacy. She was put on Bipap and improved.   Felt dizzy 2/25, called EMS and found to be in SVT. Given adenosine and converted to NSR. K was 2.5, but was felt to be dilutional. Labs on re-check showed K 3.8.  Numerous ED visits since last OPV in February for varies reasons: mainly arthritis related pain, atypical chest pain, AKI, dermatitis, nausea/vomiting, etc. Most recent hospitalization 5/25 for failure to thrive and cellulitis vs. panniculitis.   She  presented today as a walk-in for heart failure follow up. Using rollator and walker. On 2L [place]. Reports that she has been losing weight in the last year, states that she has significantly changed her diet and is able to ambulate more now that she has lost weight. Overall feeling fine. NYHA IIIb, mainly due to arthritis and COPD. Reports atyipcal chest pain occasionally, comes on whenever, thinks it may be related to anxiety, resolved on its own. Denies near-syncope, palpitations, and dizziness. Able to perform ADLs. Appetite okay. Compliant with all medications.  PMH: 1. Back and hip pain 2. HTN 3. Obesity 4. GERD 5. Hyperlipidemia 6. Depression 7. COPD: Has quit smoking. Now on home oxygen  2L [place].  PFTs (3/17) with severe COPD.  8. 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 Event monitor (12/14) with only NSR noted.  10. Chest pain: Atypical.  ETT-Cardiolite  (12/13) with EF 60%, no ischemia/infarction. Lexiscan  Cardiolite  (2/17) with small, moderate-intensity partially reversible inferolateral defect. LHC (3/17): No significant CAD. 11. Syncope: Uncertain etiology.   12. OHS/OSA: Cannot tolerate CPAP. Sleep study negative for OSA in 5/17.  13. Gout 14. Migraines 15. Diabetes 16. Diastolic CHF: Echo (8/16) with EF 55-60%, grade II diastolic dysfunction.  - RHC (6/82) with mean RA 8, PA 45/20 mean 30, mean PCWP 20; PVR 1.5 WU.  - Echo (4/18): EF 45-50%, diffuse hypokinesis, grade II diastolic dysfunction, moderately dilated RV with moderately decreased systolic function. -  Echo (4/20): EF 60-65%, mild RV enlargement with normal systolic function. Respirophasic septal variation noted.   - RHC (6/21): mean RA 11, PA 44/14 mean 27, mean PCWP 15, CI 4.6, PVR 1.1.  - Echo (2/24): EF 55-60%, mild LVH, mildly D-shaped septum.   17. 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  18. Hypothyroidism  Current Outpatient Medications  Medication Sig Dispense  Refill   ACETAMINOPHEN  PO Take 1,300-2,050 mg by mouth daily as needed for moderate pain (pain score 4-6) or headache.     albuterol  (VENTOLIN  HFA) 108 (90 Base) MCG/ACT inhaler Inhale 1-2 puffs into the lungs every 6 (six) hours as needed for wheezing or shortness of breath. 1 each 0   aspirin  EC 81 MG tablet Take 1 tablet (81 mg total) by mouth daily. Swallow whole. 90 tablet 3   blood glucose meter kit and supplies KIT Dispense based on patient and insurance preference. Use up to four times daily as directed. 1 each 0   Blood Glucose Monitoring Suppl DEVI 1 each by Does not apply route in the morning, at noon, and at bedtime. May substitute to any manufacturer covered by patient's insurance. 1 each 0   budesonide  (PULMICORT ) 0.25 MG/2ML nebulizer solution Inhale 2 mLs (0.25 mg total) by nebulization 2 (two) times daily. 120 mL 0   busPIRone  (BUSPAR ) 5 MG tablet Take 1 tablet (5 mg total) by mouth 3 (three) times daily. 270 tablet 0   butalbital -acetaminophen -caffeine  (FIORICET ) 50-325-40 MG tablet Take 1-2 tablets by mouth every 6 (six) hours as needed for headache. 20 tablet 0   carvedilol  (COREG ) 6.25 MG tablet Take 1 tablet (6.25 mg total) by mouth 2 (two) times daily. 60 tablet 11   clotrimazole  (LOTRIMIN ) 1 % cream Apply topically 2 (two) times daily. 30 g 0   EPINEPHrine  0.3 mg/0.3 mL IJ SOAJ injection Inject 0.3 mg into the muscle once as needed for anaphylaxis.     FARXIGA  10 MG TABS tablet Take 1 tablet (10 mg total) by mouth daily before breakfast. 30 tablet 11   FEROSUL 325 (65 Fe) MG tablet Take 1 tablet (325 mg total) by mouth daily. 30 tablet 1   Glucose Blood (BLOOD GLUCOSE TEST STRIPS) STRP 1 each by In Vitro route in the morning, at noon, and at bedtime. May substitute to any manufacturer covered by patient's insurance. 100 strip 0   Lancet Device MISC 1 each by Does not apply route in the morning, at noon, and at bedtime. May substitute to any manufacturer covered by patient's  insurance. 1 each 0   Lancets Misc. MISC 1 each by Does not apply route in the morning, at noon, and at bedtime. May substitute to any manufacturer covered by patient's insurance. 100 each 0   levothyroxine  (SYNTHROID ) 125 MCG tablet Take 1 tablet (125 mcg total) by mouth at bedtime. 90 tablet 0   nitroGLYCERIN  (NITROSTAT ) 0.4 MG SL tablet Place 1 tablet (0.4 mg total) under the tongue every 5 (five) minutes as needed for chest pain. 12 tablet 6   nystatin  cream (MYCOSTATIN ) Apply 1 Application topically 2 (two) times daily as needed for dry skin.     omeprazole  (PRILOSEC) 20 MG capsule Take 2 capsules (40 mg total) by mouth daily. 180 capsule 1   ondansetron  (ZOFRAN -ODT) 4 MG disintegrating tablet Take 1 tablet (4 mg total) by mouth every 8 (eight) hours as needed. 20 tablet 0   OXYGEN  Inhale 2 L into the lungs continuous.     potassium  chloride (KLOR-CON ) 10 MEQ tablet Take 1 tablet (10 mEq total) by mouth 2 (two) times daily. 60 tablet 6   rosuvastatin  (CRESTOR ) 10 MG tablet Take 1 tablet (10 mg total) by mouth every evening. 30 tablet 11   torsemide  (DEMADEX ) 20 MG tablet Take 2 tablets (40 mg total) by mouth 2 (two) times daily. 120 tablet 11   umeclidinium-vilanterol (ANORO ELLIPTA ) 62.5-25 MCG/ACT AEPB Inhale 1 puff into the lungs daily. 98 each 0   Vitamin D , Ergocalciferol , (DRISDOL ) 1.25 MG (50000 UNIT) CAPS capsule Take 1 capsule (50,000 Units total) by mouth every 7 (seven) days. 12 capsule 0   allopurinol  (ZYLOPRIM ) 100 MG tablet Take 1 tablet (100 mg total) by mouth daily. 90 tablet 3   amoxicillin -clavulanate (AUGMENTIN ) 875-125 MG tablet Take 1 tablet by mouth every 12 (twelve) hours. (Patient not taking: Reported on 11/22/2023) 14 tablet 0   calcitRIOL  (ROCALTROL ) 0.5 MCG capsule Take 1 capsule (0.5 mcg total) by mouth daily. 30 capsule 0   colchicine  0.6 MG tablet TAKE 1 TABLET (0.6 MG TOTAL) BY MOUTH 2 (TWO) TIMES DAILY FOR 5 DAYS. 10 tablet 0   dicyclomine  (BENTYL ) 20 MG tablet  Take 1 tablet (20 mg total) by mouth 2 (two) times daily. 20 tablet 0   doxycycline  (VIBRAMYCIN ) 100 MG capsule Take 1 capsule (100 mg total) by mouth 2 (two) times daily. (Patient not taking: Reported on 11/22/2023) 14 capsule 0   fenofibrate  (TRICOR ) 145 MG tablet Take 1 tablet (145 mg total) by mouth daily. 30 tablet 11   levocetirizine (XYZAL ) 5 MG tablet Take 1 tablet (5 mg total) by mouth at bedtime. 30 tablet 0   losartan  (COZAAR ) 25 MG tablet Take 1 tablet (25 mg total) by mouth daily. 30 tablet 11   polyethylene glycol (MIRALAX  / GLYCOLAX ) 17 g packet Take 17 g by mouth daily as needed for mild constipation. (Patient not taking: Reported on 11/22/2023) 30 each 0   senna-docusate (SENOKOT-S) 8.6-50 MG tablet Take 1 tablet by mouth daily. 30 tablet 5   spironolactone  (ALDACTONE ) 25 MG tablet Take 1 tablet (25 mg total) by mouth daily. 30 tablet 11   traZODone  (DESYREL ) 50 MG tablet Take 2 tablets (100 mg total) by mouth at bedtime. 60 tablet 0   No current facility-administered medications for this encounter.   Allergies  Allergen Reactions   Bee Venom Anaphylaxis    All over my body (swelling)   Fioricet  [Butalbital -Apap-Caffeine ] Nausea And Vomiting and Rash   Ibuprofen  Rash and Other (See Comments)    Severe rash   Lamisil  [Terbinafine ] Rash and Other (See Comments)    Pt states this causes her to feel funny   Nsaids Other (See Comments)    Per MD's orders    Social History   Socioeconomic History   Marital status: Single    Spouse name: Not on file   Number of children: 1   Years of education: Not on file   Highest education level: Not on file  Occupational History   Occupation: disabled  Tobacco Use   Smoking status: Former    Current packs/day: 0.00    Average packs/day: 0.5 packs/day for 41.0 years (20.5 ttl pk-yrs)    Types: Cigarettes    Start date: 07/1975    Quit date: 07/2016    Years since quitting: 7.3   Smokeless tobacco: Never  Vaping Use   Vaping  status: Never Used  Substance and Sexual Activity   Alcohol use: Not Currently  Comment: quit 12 years ago   Drug use: Not Currently    Types: Marijuana, Cocaine    Comment: quit 12 years ago   Sexual activity: Yes  Other Topics Concern   Not on file  Social History Narrative   Not on file   Social Drivers of Health   Financial Resource Strain: Low Risk  (10/24/2022)   Overall Financial Resource Strain (CARDIA)    Difficulty of Paying Living Expenses: Not very hard  Food Insecurity: No Food Insecurity (09/09/2023)   Hunger Vital Sign    Worried About Running Out of Food in the Last Year: Never true    Ran Out of Food in the Last Year: Never true  Transportation Needs: No Transportation Needs (09/09/2023)   PRAPARE - Administrator, Civil Service (Medical): No    Lack of Transportation (Non-Medical): No  Physical Activity: Inactive (02/01/2021)   Exercise Vital Sign    Days of Exercise per Week: 0 days    Minutes of Exercise per Session: 0 min  Stress: Stress Concern Present (02/23/2021)   [employer] of Occupational Health - Occupational Stress Questionnaire    Feeling of Stress : To some extent  Social Connections: Unknown (08/21/2021)   Received from [facility]   Social Network    Social Network: Not on file  Intimate Partner Violence: Not At Risk (09/09/2023)   Humiliation, Afraid, Rape, and Kick questionnaire    Fear of Current or Ex-Partner: No    Emotionally Abused: No    Physically Abused: No    Sexually Abused: No   Family History  Problem Relation Age of Onset   Cancer Father        thought to be due to exposure to concrete   Diabetes Mother    Thyroid  cancer Mother        dx in her 9s-60s   Cancer Maternal Uncle        2 uncles with cancer NOS   Brain cancer Paternal Aunt    Cancer Cousin        maternal first cousin - NOS   Cancer Cousin        maternal first cousin - NOS   Wt Readings from Last 3 Encounters:  11/22/23 (!) 140.8 kg  (310 lb 6.4 oz)  11/02/23 (!) 142.7 kg (314 lb 9.6 oz)  09/09/23 (!) 149.7 kg (330 lb)   BP 126/70   Pulse 84   Ht 5' 5 (1.651 m)   Wt (!) 140.8 kg (310 lb 6.4 oz)   SpO2 93%   BMI 51.65 kg/m   PHYSICAL EXAM: General: Chronically ill appearing. Arrive with rollator and home O2 Cardiac: JVP difficult to assess, thick neck. S1 and S2 present.  Abdomen: Soft, non-tender, non-distended.  Extremities: Warm and dry.  No peripheral edema.  Neuro: Alert and oriented x3. Affect pleasant. Moves all extremities without difficulty.  ECG (personally reviewed from 10/19/23): NSR 82 bpm  ASSESSMENT & PLAN: 1. Chronic diastolic CHF: Has had multiple prior admissions with CHF and COPD exacerbations.  Echo 05/2022 with EF 55-60%, D-shaped septum, Grade IDD.   - NYHA class IIIb, functional class confounded by body habitus and generalized arthritis.  - Appears euvolemic - Continue torsemide  60mg  qam + 40mg  qpm, BMET/BNP today.  - Continue Farxiga  10 mg daily. No GU symptoms - Continue losartan  25 mg daily.  - Continue spironolactone  25 mg daily.  - Echo re-ordered today. - Cardiomems would be beneficial given multiple admissions  for CHF and difficult exam. Discussed risks and benefits and she agrees to placement. Unfortunately she has Medicaid and they will not cover. 2. HTN: BP controlled on current meds. 3. Obesity: Body mass index is 51.65 kg/m. - Reports that she is losing weight on her own, she does not want GLP1 referral at this time 4. Chronic Hypoxemic Respiratory Failure: Suspect due to COPD and OHS. She is on 2L home oxygen .  - Should follow with pulmonary.  5. SVT: Episode in 2/25, required 6 mg of adensine to break. 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 - Zio ordered in February, she brought in 2 Zio monitors today to return. Unsure if able to get any data from them at this point. Will not reorder another. - Continue Coreg  to 6.25 mg  bid.  - Update echo.  - TSH, Free T4 today 6. SDOH: She lives alone, has HH aide. Her medications are mailed to her and she has refills. She has transportation and Medicaid. - She is followed closely by psych at behavioral health, interested in something to take for stress. Defer to Psych/PCP.  Follow up in 3 months with Dr. Rolan + echo  [place] Edmundo Tedesco, NP 11/22/2023

## 2023-11-22 NOTE — Telephone Encounter (Signed)
 I called and spoke with patient and she was very upset that her Rx for a mouthpiece was not taken care of.

## 2023-11-22 NOTE — Telephone Encounter (Signed)
 Copied from CRM [id number]. Topic: General - Other >> Nov 22, 2023  2:05 PM Aisha D wrote: Reason for CRM: pt called in and stated that she missed a call from someone in the office in regards to them agreeing to pay for a piece for her machine. No name left on VM. Please advise.   UPDATE: Pt is requesting to speak with a nurse. She stated that someone informed her that she would be getting a callback from the nurse today and she hasn't received a call yet. Please reach to the pt when available.

## 2023-11-22 NOTE — Telephone Encounter (Signed)
 Copied from CRM [id number]. Topic: General - Call Back - No Documentation >> Nov 22, 2023 11:12 AM Taleah C wrote: Reason for CRM: pt called in and stated that she missed a call from one of the in regards to them agreeing to pay for a piece for her machine. No name left on VM. Please advise.

## 2023-11-23 NOTE — Telephone Encounter (Signed)
 Pt states she received her mouth piece. Pt seems upset because the mouth piece cost her $8.0 and someone from the office LVM that they would take care of it. I stated that the pharmacy advised that it was an OTC purchase. Pt states when I come up there, I'm going to let yall hear it. I'm going to call yall back. Bye- bye. call was disconnected.

## 2023-11-23 NOTE — Telephone Encounter (Signed)
 LVM for pt reminding her that I spoke with her on Friday, 11/16/23 and informed her that I spoke with her pharmacy and they mouthpiece she is referring to is purchased OTC, not a prescription sent by PCP.  During the call with pt on 11/16/23 she specifically stated honey, I have death in the family and I am not doing too good, please just let me go, let me hang up. I explained that I was trying to help get her concerns regarding the mouthpiece addressed and get the medication refilled that she called and stated she needed.

## 2023-11-23 NOTE — Telephone Encounter (Unsigned)
 Copied from CRM [id number]. Topic: General - Other >> Nov 23, 2023  9:41 AM Berneda FALCON wrote: Reason for CRM: Pt is returning phone call to [place]. Called the CAL to see if she was available. She was in a room with a patient.  Please call back [phone]

## 2023-11-23 NOTE — Telephone Encounter (Signed)
 Please see and addend 11/16/23 TE for further correspondence.

## 2023-11-26 ENCOUNTER — Telehealth: Payer: Self-pay | Admitting: Family Medicine

## 2023-11-26 NOTE — Addendum Note (Signed)
 Addended by: ROZELL POWELL HERO on: 11/26/2023 12:59 PM   Modules accepted: Orders

## 2023-11-26 NOTE — Telephone Encounter (Signed)
 RX was refilled on 11/06/2023.

## 2023-11-26 NOTE — Telephone Encounter (Signed)
 Copied from CRM #[id number]. Topic: Referral - Status >> Nov 26, 2023 12:55 PM Dedra B wrote: Reason for CRM:  Pt wants to know if her paperwork for her diabetic shoes has been faxed. Ortho doctor number is [phone]. Fax is [phone].

## 2023-11-26 NOTE — Telephone Encounter (Unsigned)
 Copied from CRM [id number]. Topic: Clinical - Medication Refill >> Nov 26, 2023 12:40 PM Dedra B wrote: Medication: busPIRone  (BUSPAR ) 5 MG tablet clotrimazole  (LOTRIMIN ) 1 % cream  Has the patient contacted their pharmacy? No (Agent: If no, request that the patient contact the pharmacy for the refill. If patient does not wish to contact the pharmacy document the reason why and proceed with request.) Prefers to the contact the office   This is the patient's preferred pharmacy:  [facility] - [place], [place] - [address] [place] [address] Phone: [phone] Fax: [phone]  Is this the correct pharmacy for this prescription? Yes  Has the prescription been filled recently? Yes, 11/06/23  Is the patient out of the medication? Yes out of lotrimin  cream, but has 20 of the buspirone  that she takes 3 times daily  Has the patient been seen for an appointment in the last year OR does the patient have an upcoming appointment? Yes  Can we respond through MyChart? No  Agent: Please be advised that Rx refills may take up to 3 business days. We ask that you follow-up with your pharmacy.

## 2023-11-27 ENCOUNTER — Other Ambulatory Visit: Payer: Self-pay

## 2023-11-27 DIAGNOSIS — E1142 Type 2 diabetes mellitus with diabetic polyneuropathy: Secondary | ICD-10-CM

## 2023-11-27 DIAGNOSIS — E119 Type 2 diabetes mellitus without complications: Secondary | ICD-10-CM

## 2023-11-27 NOTE — Telephone Encounter (Signed)
 DME order was faxed was placed on 11/27/2023 with confirmation. Patient was also called to inform; left VM

## 2023-11-29 ENCOUNTER — Other Ambulatory Visit: Payer: Self-pay | Admitting: Family Medicine

## 2023-11-29 ENCOUNTER — Emergency Department (HOSPITAL_COMMUNITY)

## 2023-11-29 ENCOUNTER — Telehealth: Payer: Self-pay

## 2023-11-29 ENCOUNTER — Other Ambulatory Visit: Payer: Self-pay

## 2023-11-29 ENCOUNTER — Encounter (HOSPITAL_COMMUNITY): Payer: Self-pay

## 2023-11-29 ENCOUNTER — Emergency Department (HOSPITAL_COMMUNITY)
Admission: EM | Admit: 2023-11-29 | Discharge: 2023-11-29 | Disposition: A | Discharge status: Home / Self Care | Attending: Emergency Medicine | Admitting: Emergency Medicine

## 2023-11-29 DIAGNOSIS — Z79899 Other long term (current) drug therapy: Secondary | ICD-10-CM | POA: Diagnosis not present

## 2023-11-29 DIAGNOSIS — J449 Chronic obstructive pulmonary disease, unspecified: Secondary | ICD-10-CM | POA: Insufficient documentation

## 2023-11-29 DIAGNOSIS — I110 Hypertensive heart disease with heart failure: Secondary | ICD-10-CM | POA: Insufficient documentation

## 2023-11-29 DIAGNOSIS — E119 Type 2 diabetes mellitus without complications: Secondary | ICD-10-CM | POA: Insufficient documentation

## 2023-11-29 DIAGNOSIS — T148XXA Other injury of unspecified body region, initial encounter: Secondary | ICD-10-CM

## 2023-11-29 DIAGNOSIS — Z7982 Long term (current) use of aspirin: Secondary | ICD-10-CM | POA: Insufficient documentation

## 2023-11-29 DIAGNOSIS — S31109A Unspecified open wound of abdominal wall, unspecified quadrant without penetration into peritoneal cavity, initial encounter: Secondary | ICD-10-CM | POA: Diagnosis not present

## 2023-11-29 DIAGNOSIS — X58XXXA Exposure to other specified factors, initial encounter: Secondary | ICD-10-CM | POA: Diagnosis not present

## 2023-11-29 DIAGNOSIS — Z9981 Dependence on supplemental oxygen: Secondary | ICD-10-CM | POA: Diagnosis not present

## 2023-11-29 DIAGNOSIS — R079 Chest pain, unspecified: Secondary | ICD-10-CM

## 2023-11-29 DIAGNOSIS — I5033 Acute on chronic diastolic (congestive) heart failure: Secondary | ICD-10-CM | POA: Diagnosis not present

## 2023-11-29 DIAGNOSIS — E039 Hypothyroidism, unspecified: Secondary | ICD-10-CM | POA: Diagnosis not present

## 2023-11-29 DIAGNOSIS — R0600 Dyspnea, unspecified: Secondary | ICD-10-CM

## 2023-11-29 DIAGNOSIS — R0602 Shortness of breath: Secondary | ICD-10-CM | POA: Diagnosis present

## 2023-11-29 LAB — TROPONIN I (HIGH SENSITIVITY)
Troponin I (High Sensitivity): 5 ng/L (ref <18)
Troponin I (High Sensitivity): 6 ng/L (ref <18)

## 2023-11-29 LAB — CBC
HCT: 35.2 % — ABNORMAL LOW (ref 36.0–46.0)
Hemoglobin: 11 g/dL — ABNORMAL LOW (ref 12.0–15.0)
MCH: 30.3 pg (ref 26.0–34.0)
MCHC: 31.3 g/dL (ref 30.0–36.0)
MCV: 97 fL (ref 80.0–100.0)
Platelets: 287 K/uL (ref 150–400)
RBC: 3.63 MIL/uL — ABNORMAL LOW (ref 3.87–5.11)
RDW: 13.6 % (ref 11.5–15.5)
WBC: 12.4 K/uL — ABNORMAL HIGH (ref 4.0–10.5)
nRBC: 0 % (ref 0.0–0.2)

## 2023-11-29 LAB — BASIC METABOLIC PANEL WITH GFR
Anion gap: 14 (ref 5–15)
BUN: 16 mg/dL (ref 8–23)
CO2: 29 mmol/L (ref 22–32)
Calcium: 8.9 mg/dL (ref 8.9–10.3)
Chloride: 104 mmol/L (ref 98–111)
Creatinine, Ser: 1.06 mg/dL — ABNORMAL HIGH (ref 0.44–1.00)
GFR, Estimated: 59 mL/min — ABNORMAL LOW (ref >60)
Glucose, Bld: 132 mg/dL — ABNORMAL HIGH (ref 70–99)
Potassium: 3.9 mmol/L (ref 3.5–5.1)
Sodium: 147 mmol/L — ABNORMAL HIGH (ref 135–145)

## 2023-11-29 LAB — D-DIMER, QUANTITATIVE: D-Dimer, Quant: <0.27 ug{FEU}/mL (ref 0.00–0.50)

## 2023-11-29 LAB — BRAIN NATRIURETIC PEPTIDE: B Natriuretic Peptide: 15.8 pg/mL (ref 0.0–100.0)

## 2023-11-29 MED ORDER — FUROSEMIDE 10 MG/ML IJ SOLN
40.0000 mg | Freq: Once | INTRAMUSCULAR | Status: AC
  Administered 2023-11-29: 40 mg via INTRAVENOUS
  Filled 2023-11-29: qty 4

## 2023-11-29 MED ORDER — DOXYCYCLINE HYCLATE 100 MG PO CAPS: 100.0000 mg | ORAL_CAPSULE | Freq: Two times a day (BID) | ORAL | 0 refills | Status: DC

## 2023-11-29 MED ORDER — MORPHINE SULFATE (PF) 4 MG/ML IV SOLN
4.0000 mg | Freq: Once | INTRAVENOUS | Status: AC
  Administered 2023-11-29: 4 mg via INTRAVENOUS
  Filled 2023-11-29: qty 1

## 2023-11-29 NOTE — ED Provider Notes (Signed)
 [facility] Provider Note   CSN: [id number] Arrival date & time: 11/29/23  1522     Patient presents with: Chest Pain and Shortness of Breath   Michele Owens is a 63 y.o. female with past medical history of bipolar disorder, OSA, non-insulin -dependent T2DM, HTN, pulmonary HTN, HLD, COPD, DVT, HF, hypothyroidism, chronic oxygen  use at 2 L, BMI 52 presents emergency department via EMS for evaluation of midsternal sharp chest pain that started yesterday.  Reports that pain is intermittent and worsens with exertion.  No radiation of chest pain into extremities, jaw, or back.  Also has associated shortness of breath that started with chest pain.  Reports that she has missed 2 of her Lasix  doses this afternoon but otherwise has not missed a dose.  Has lost >50 lbs this year following improving her diet, no recent weight gain.Was provided aspirin  324 mg, NTG en route by EMS improving chest pain.    Also complains of a boil under pannus that she is concerned about infection.  Recently burst and drained    Chest Pain Associated symptoms: shortness of breath   Shortness of Breath Associated symptoms: chest pain        Prior to Admission medications   Medication Sig Start Date End Date Taking? Authorizing Provider  doxycycline  (VIBRAMYCIN ) 100 MG capsule Take 1 capsule (100 mg total) by mouth 2 (two) times daily for 7 days. 11/29/23 12/06/23 Yes Minnie Tinnie BRAVO, PA  ACETAMINOPHEN  PO Take 1,300-2,050 mg by mouth daily as needed for moderate pain (pain score 4-6) or headache.    [provider]  albuterol  (VENTOLIN  HFA) 108 (90 Base) MCG/ACT inhaler Inhale 1-2 puffs into the lungs every 6 (six) hours as needed for wheezing or shortness of breath. 11/24/22   Rolan Ezra RAMAN, MD  allopurinol  (ZYLOPRIM ) 100 MG tablet Take 1 tablet (100 mg total) by mouth daily. 11/13/23 11/07/24  [name], MD  amoxicillin -clavulanate (AUGMENTIN ) 875-125 MG  tablet Take 1 tablet by mouth every 12 (twelve) hours. Patient not taking: Reported on 11/22/2023 10/19/23   Cleotilde Rogue, MD  aspirin  EC 81 MG tablet Take 1 tablet (81 mg total) by mouth daily. Swallow whole. 11/13/23 11/07/24  [name], MD  blood glucose meter kit and supplies KIT Dispense based on patient and insurance preference. Use up to four times daily as directed. 12/25/20   Lue Elsie BROCKS, MD  Blood Glucose Monitoring Suppl DEVI 1 each by Does not apply route in the morning, at noon, and at bedtime. May substitute to any manufacturer covered by patient's insurance. 11/13/23   [name], MD  budesonide  (PULMICORT ) 0.25 MG/2ML nebulizer solution Inhale 2 mLs (0.25 mg total) by nebulization 2 (two) times daily. 11/13/23   [name], MD  busPIRone  (BUSPAR ) 5 MG tablet Take 1 tablet (5 mg total) by mouth 3 (three) times daily. 11/06/23   [name], MD  butalbital -acetaminophen -caffeine  (FIORICET ) 50-325-40 MG tablet Take 1-2 tablets by mouth every 6 (six) hours as needed for headache. 11/07/23 11/06/24  Webb, Padonda B, FNP  calcitRIOL  (ROCALTROL ) 0.5 MCG capsule Take 1 capsule (0.5 mcg total) by mouth daily. 09/26/16   Hongalgi, Anand D, MD  carvedilol  (COREG ) 6.25 MG tablet Take 1 tablet (6.25 mg total) by mouth 2 (two) times daily. 11/06/23   Webb, Padonda B, FNP  clotrimazole  (LOTRIMIN ) 1 % cream Apply topically 2 (two) times daily. 11/06/23   [name], MD  colchicine   0.6 MG tablet TAKE 1 TABLET (0.6 MG TOTAL) BY MOUTH 2 (TWO) TIMES DAILY FOR 5 DAYS. 11/12/23 11/17/23  [name], MD  dicyclomine  (BENTYL ) 20 MG tablet TAKE 1 TABLET (20 MG TOTAL) BY MOUTH 2 (TWO) TIMES DAILY. 11/29/23   [name], MD  doxycycline  (VIBRAMYCIN ) 100 MG capsule Take 1 capsule (100 mg total) by mouth 2 (two) times daily. Patient not taking: Reported on 11/22/2023 06/11/23   Charlyn Sora, MD  EPINEPHrine  0.3 mg/0.3 mL IJ SOAJ injection Inject 0.3 mg into the muscle once as  needed for anaphylaxis.    [provider]  FARXIGA  10 MG TABS tablet Take 1 tablet (10 mg total) by mouth daily before breakfast. 11/06/23   [name], MD  fenofibrate  (TRICOR ) 145 MG tablet Take 1 tablet (145 mg total) by mouth daily. 02/02/23   Rolan Ezra RAMAN, MD  FEROSUL 325 (65 Fe) MG tablet Take 1 tablet (325 mg total) by mouth daily. 11/07/23   Webb, Padonda B, FNP  Glucose Blood (BLOOD GLUCOSE TEST STRIPS) STRP 1 each by In Vitro route in the morning, at noon, and at bedtime. May substitute to any manufacturer covered by patient's insurance. 11/13/23 12/13/23  [name], MD  Lancet Device MISC 1 each by Does not apply route in the morning, at noon, and at bedtime. May substitute to any manufacturer covered by patient's insurance. 11/13/23 12/13/23  [name], MD  Lancets Misc. MISC 1 each by Does not apply route in the morning, at noon, and at bedtime. May substitute to any manufacturer covered by patient's insurance. 11/13/23 12/13/23  [name], MD  levocetirizine (XYZAL ) 5 MG tablet Take 1 tablet (5 mg total) by mouth at bedtime. 11/16/23   [name], MD  levothyroxine  (SYNTHROID ) 125 MCG tablet Take 1 tablet (125 mcg total) by mouth at bedtime. 11/06/23   [name], MD  losartan  (COZAAR ) 25 MG tablet Take 1 tablet (25 mg total) by mouth daily. 11/13/23   [name], MD  nitroGLYCERIN  (NITROSTAT ) 0.4 MG SL tablet Place 1 tablet (0.4 mg total) under the tongue every 5 (five) minutes as needed for chest pain. 05/23/23   Glena Harlene HERO, FNP  nystatin  cream (MYCOSTATIN ) Apply 1 Application topically 2 (two) times daily as needed for dry skin. 09/26/22   [provider]  omeprazole  (PRILOSEC) 20 MG capsule Take 2 capsules (40 mg total) by mouth daily. 11/06/23   [name], MD  ondansetron  (ZOFRAN -ODT) 4 MG disintegrating tablet Take 1 tablet (4 mg total) by mouth every 8 (eight) hours as needed. 08/29/23   Henderly, Britni A, PA-C   OXYGEN  Inhale 2 L into the lungs continuous.    [provider]  polyethylene glycol (MIRALAX  / GLYCOLAX ) 17 g packet Take 17 g by mouth daily as needed for mild constipation. Patient not taking: Reported on 11/22/2023 11/13/23   [name], MD  potassium chloride  (KLOR-CON ) 10 MEQ tablet Take 1 tablet (10 mEq total) by mouth 2 (two) times daily. 11/13/23   [name], MD  rosuvastatin  (CRESTOR ) 10 MG tablet Take 1 tablet (10 mg total) by mouth every evening. 11/13/23   [name], MD  senna-docusate (SENOKOT-S) 8.6-50 MG tablet Take 1 tablet by mouth daily. 02/07/23   Henson, Vickie L, NP-C  spironolactone  (ALDACTONE ) 25 MG tablet Take 1 tablet (25 mg total) by mouth daily. 11/13/23   [name], MD  torsemide  (DEMADEX ) 20 MG  tablet Take 2 tablets (40 mg total) by mouth 2 (two) times daily. 11/13/23   [name], MD  traZODone  (DESYREL ) 50 MG tablet Take 2 tablets (100 mg total) by mouth at bedtime. 11/06/23   Webb, Padonda B, FNP  umeclidinium-vilanterol (ANORO ELLIPTA ) 62.5-25 MCG/ACT AEPB Inhale 1 puff into the lungs daily. 11/06/23   Webb, Padonda B, FNP  Vitamin D , Ergocalciferol , (DRISDOL ) 1.25 MG (50000 UNIT) CAPS capsule Take 1 capsule (50,000 Units total) by mouth every 7 (seven) days. 11/13/23   [name], MD    Allergies: Bee venom, Fioricet  [butalbital -apap-caffeine ], Ibuprofen , Lamisil  [terbinafine ], and Nsaids    Review of Systems  Respiratory:  Positive for shortness of breath.   Cardiovascular:  Positive for chest pain.    Updated Vital Signs BP (!) 106/57   Pulse 67   Temp 98.7 F (37.1 C) (Axillary)   Resp 16   Ht 5' 5 (1.651 m)   Wt (!) 142.4 kg   SpO2 95%   BMI 52.25 kg/m   Physical Exam Vitals and nursing note reviewed.  Constitutional:      General: She is not in acute distress.    Appearance: Normal appearance.  HENT:     Head: Normocephalic and atraumatic.  Eyes:     Conjunctiva/sclera: Conjunctivae normal.   Cardiovascular:     Rate and Rhythm: Normal rate.     Pulses:          Dorsalis pedis pulses are 2+ on the right side and 2+ on the left side.  Pulmonary:     Effort: Pulmonary effort is normal. No respiratory distress.     Breath sounds: Examination of the right-lower field reveals decreased breath sounds. Examination of the left-lower field reveals decreased breath sounds. Decreased breath sounds present.     Comments: Speaking in full and complete sentences without difficulty.  No tachypnea.  Currently on her baseline 2 L oxygen  via nasal cannula Abdominal:     Comments: Small flat circular nonfluctant nontender open wound under left abdominal fold. No drainage, streaking, swelling, nor surrounding erythema  Musculoskeletal:     Right lower leg: No tenderness. 1+ Pitting Edema present.     Left lower leg: No tenderness. 1+ Pitting Edema present.  Skin:    Coloration: Skin is not jaundiced or pale.  Neurological:     Mental Status: She is alert. Mental status is at baseline.     (all labs ordered are listed, but only abnormal results are displayed) Labs Reviewed  BASIC METABOLIC PANEL WITH GFR - Abnormal; Notable for the following components:      Result Value   Sodium 147 (*)    Glucose, Bld 132 (*)    Creatinine, Ser 1.06 (*)    GFR, Estimated 59 (*)    All other components within normal limits  CBC - Abnormal; Notable for the following components:   WBC 12.4 (*)    RBC 3.63 (*)    Hemoglobin 11.0 (*)    HCT 35.2 (*)    All other components within normal limits  BRAIN NATRIURETIC PEPTIDE  D-DIMER, QUANTITATIVE  TROPONIN I (HIGH SENSITIVITY)  TROPONIN I (HIGH SENSITIVITY)    EKG: EKG Interpretation Date/Time:  Thursday November 29 2023 15:32:28 EDT Ventricular Rate:  71 PR Interval:  174 QRS Duration:  93 QT Interval:  422 QTC Calculation: 459 R Axis:   60  Text Interpretation: Sinus rhythm Confirmed by Yolande Charleston [phone]) on 11/29/2023 5:10:39  PM  Radiology: [facility]  Chest 2 View Result Date: 11/29/2023 CLINICAL DATA:  cp EXAM: CHEST - 2 VIEW COMPARISON:  October 19, 2023 FINDINGS: The patient is rotated towards the right. Low lung volumes. No focal airspace consolidation or pleural effusion. Hazy attenuation of the left lung base on the frontal radiograph with blunting of the left costophrenic angle posteriorly. No pneumothorax. Mild cardiomegaly. Prominence of the right hilar region and paratracheal stripe, likely rotational. No acute fracture or destructive lesions. Multilevel thoracic osteophytosis. IMPRESSION: Low lung volumes. Hazy attenuation of the left lung base on the frontal radiograph, may represent atelectasis with a small pleural effusion. Electronically Signed   By: Rogelia Myers M.D.   On: 11/29/2023 16:21      Medications Ordered in the ED  morphine  (PF) 4 MG/ML injection 4 mg (4 mg Intravenous Given 11/29/23 1623)  furosemide  (LASIX ) injection 40 mg (40 mg Intravenous Given 11/29/23 2020)                                    Medical Decision Making Amount and/or Complexity of Data Reviewed Labs: ordered. Radiology: ordered.  Risk Prescription drug management.   Patient presents to the ED for concern of boil, CP, SHOB, this involves an extensive number of treatment options, and is a complaint that carries with it a high risk of complications and morbidity.  The differential diagnosis includes ACS, PE, pneumonia, fluid overload, MSK, abscess, cellulitis.  Nonexhaustive list   Co morbidities that complicate the patient evaluation  See HPI   Additional history obtained:  Additional history obtained from Nursing   External records from outside source obtained and reviewed including triage note   Lab Tests:  I Ordered, and personally interpreted labs.  The pertinent results include:   Dimer WNL Troponin x 2 WNL BNP 15 WBC 12.4 Hgb 11 Creatinine 1.06   Imaging Studies ordered:  I ordered imaging  studies including chest x-ray I independently visualized and interpreted imaging which showed  Low lung volumes. Hazy attenuation of the left lung base on the frontal radiograph, may represent atelectasis with a small pleural effusion I agree with the radiologist interpretation   Cardiac Monitoring:  The patient was maintained on a cardiac monitor.  I personally viewed and interpreted the cardiac monitored which showed an underlying rhythm of: NSR at 71 bpm with no ST nor T wave abnormalities   Medicines ordered and prescription drug management:  I ordered medication including morphine , Doxy for pain, skin infection prophylaxis Reevaluation of the patient after these medicines showed that the patient improved I have reviewed the patients home medicines and have made adjustments as needed    Problem List / ED Course:  CP EKG shows NSR with no ST nor T wave abnormalities Troponin negative x 2 Last echo shows LVEF 55-60% on 05/15/2022.   Last saw cardiology 11/22/2023 who recommended 16-month follow-up and repeat echo.  She currently is taking torsemide  60 mg every morning and 40 mg every afternoon, Farxiga , spirolactone for HF Provided morphine  for pain and Lasix  as she has not taken her nighttime medications and reports concern about building up of fluid.  Does not currently appear fluid overloaded to the point of requiring admission for IV diuresis but would benefit from IV dose.  She does have some decreased breath sounds in lower lobes that is likely related to fluid and 1+ pedal edema Reports that chest pain and shortness of breath both have  significantly improved following ASA, morphine , Lasix  administration Has f/u with cards in November  [facility] Maintaining oxygen  saturation on her baseline 2 L O2 that she wears all the time.  No new nor increased oxygen  demand Dimer negative.  Low suspicion for PE. Chest x-ray shows small pleural effusion.  Low suspicion for pneumonia with no  complaints of cough, congestion  Open wound on abdomen No tenderness no fluctuance.  No active drainage.  Low suspicion for abscess Based on location as it is in a skin fold and may be susceptible for infection, did provide Doxy for MRSA coverage Discussed symptomatic treatment at home to include keeping area clean, dry   Reevaluation:  After the interventions noted above, I reevaluated the patient and found that they have :improved   Social Determinants of Health:  Former tobacco abuse   Dispostion:  After consideration of the diagnostic results and the patients response to treatment, I feel that the patent would benefit from outpatient management symptomatic care, cardiology follow-up as needed.   Discussed ED workup, disposition, return to ED precautions with patient who expresses understanding agrees with plan.  All questions answered to their satisfaction.  They are agreeable to plan.  Discharge instructions provided on paperwork  Final diagnoses:  Chest pain, unspecified type  Dyspnea, unspecified type  Acute on chronic heart failure with preserved ejection fraction (HCC)  Open wound of skin    ED Discharge Orders          Ordered    doxycycline  (VIBRAMYCIN ) 100 MG capsule  2 times daily        11/29/23 1928             Minnie Tinnie BRAVO, PA 11/29/23 2256    Yolande Lamar BROCKS, MD 11/29/23 2322

## 2023-11-29 NOTE — Progress Notes (Unsigned)
 Complex Care Management Note Care Guide Note  11/29/2023 Name: Michele Owens MRN: [id number] DOB: 01/28/1961   Complex Care Management Outreach Attempts: An unsuccessful telephone outreach was attempted today to offer the patient information about available complex care management services.  Follow Up Plan:  Additional outreach attempts will be made to offer the patient complex care management information and services.   Encounter Outcome:  No Answer  [name]  [facility], [facility] VBCI Assistant Direct Dial: [phone]  Fax: [phone]

## 2023-11-29 NOTE — ED Triage Notes (Signed)
 Pt to ER from home via [facility]. Pt c/o chest pain starting yesterday. Pt w/ hx COPD, reports that chest pain has dissipated but shortness of breath is worse than normal. Pt presents with +3 pitting edema and reports that she has missed two doses of her lasix  this afternoon.  324mg  aspirin  given en route. Pt on 2l/[place] at baseline. 3l/ upon arrival. I also have a boil under my belly I want looked at

## 2023-11-29 NOTE — Discharge Instructions (Addendum)
 Thank you for letting us  evaluate you today.  Your cardiac enzymes were normal.  Do not think that you are having a heart attack.  Your chest x-ray showed mild bit of fluid.  We have given you IV torsemide  to help with fluid reduction.  I have also sent Doxy/antibiotic to your pharmacy for your boil.  Please take as prescribed.  Please keep area clean and dry.  Your cardiologist recommends that you schedule a follow-up appointment 3 months with echocardiogram(November) Call office in September to schedule an appointment              Return to Emergency Department if you experience chest pain, shortness of breath, worsening symptoms

## 2023-11-30 NOTE — Progress Notes (Signed)
 Complex Care Management Note  Care Guide Note 11/30/2023 Name: [name] MRN: [id number] DOB: 01-10-1961  Michele Owens is a 63 y.o. year old female who sees [name], MD for primary care. I reached out to [name] by phone today to offer complex care management services.  [name] was given information about Complex Care Management services today including:   The Complex Care Management services include support from the care team which includes your Nurse Care Manager, Clinical Social Worker, or Pharmacist.  The Complex Care Management team is here to help remove barriers to the health concerns and goals most important to you. Complex Care Management services are voluntary, and the patient may decline or stop services at any time by request to their care team member.   Complex Care Management Consent Status: Patient agreed to services and verbal consent obtained.   Follow up plan:  Telephone appointment with complex care management team member scheduled for:  12/05/23 at 2:45 p.m.   Encounter Outcome:  Patient Scheduled  [name]  [facility], [facility] VBCI Assistant Direct Dial: [phone]  Fax: [phone]

## 2023-12-03 NOTE — Addendum Note (Signed)
 Encounter addended by: Debarah Garrison MATSU, RN on: 12/03/2023 10:40 AM  Actions taken: Imaging Exam ended

## 2023-12-05 ENCOUNTER — Ambulatory Visit: Admitting: Family Medicine

## 2023-12-05 ENCOUNTER — Other Ambulatory Visit: Payer: Self-pay

## 2023-12-05 ENCOUNTER — Encounter: Payer: Self-pay | Admitting: Family Medicine

## 2023-12-05 VITALS — BP 116/65 | HR 78 | Temp 97.0°F | Resp 18 | Wt 316.4 lb

## 2023-12-05 DIAGNOSIS — E782 Mixed hyperlipidemia: Secondary | ICD-10-CM

## 2023-12-05 DIAGNOSIS — Z6841 Body Mass Index (BMI) 40.0 and over, adult: Secondary | ICD-10-CM

## 2023-12-05 DIAGNOSIS — I10 Essential (primary) hypertension: Secondary | ICD-10-CM

## 2023-12-05 DIAGNOSIS — J418 Mixed simple and mucopurulent chronic bronchitis: Secondary | ICD-10-CM

## 2023-12-05 DIAGNOSIS — E039 Hypothyroidism, unspecified: Secondary | ICD-10-CM

## 2023-12-05 DIAGNOSIS — Z9981 Dependence on supplemental oxygen: Secondary | ICD-10-CM

## 2023-12-05 DIAGNOSIS — E042 Nontoxic multinodular goiter: Secondary | ICD-10-CM

## 2023-12-05 DIAGNOSIS — E1142 Type 2 diabetes mellitus with diabetic polyneuropathy: Secondary | ICD-10-CM | POA: Diagnosis not present

## 2023-12-05 DIAGNOSIS — F330 Major depressive disorder, recurrent, mild: Secondary | ICD-10-CM

## 2023-12-05 DIAGNOSIS — Z1211 Encounter for screening for malignant neoplasm of colon: Secondary | ICD-10-CM

## 2023-12-05 DIAGNOSIS — Z1159 Encounter for screening for other viral diseases: Secondary | ICD-10-CM

## 2023-12-05 DIAGNOSIS — Z23 Encounter for immunization: Secondary | ICD-10-CM

## 2023-12-05 DIAGNOSIS — J9611 Chronic respiratory failure with hypoxia: Secondary | ICD-10-CM

## 2023-12-05 DIAGNOSIS — I5032 Chronic diastolic (congestive) heart failure: Secondary | ICD-10-CM

## 2023-12-05 DIAGNOSIS — Z87891 Personal history of nicotine dependence: Secondary | ICD-10-CM

## 2023-12-05 DIAGNOSIS — F3161 Bipolar disorder, current episode mixed, mild: Secondary | ICD-10-CM

## 2023-12-05 DIAGNOSIS — E662 Morbid (severe) obesity with alveolar hypoventilation: Secondary | ICD-10-CM

## 2023-12-05 DIAGNOSIS — E119 Type 2 diabetes mellitus without complications: Secondary | ICD-10-CM | POA: Diagnosis not present

## 2023-12-05 DIAGNOSIS — G4700 Insomnia, unspecified: Secondary | ICD-10-CM

## 2023-12-05 DIAGNOSIS — F411 Generalized anxiety disorder: Secondary | ICD-10-CM

## 2023-12-05 DIAGNOSIS — M1A39X0 Chronic gout due to renal impairment, multiple sites, without tophus (tophi): Secondary | ICD-10-CM

## 2023-12-05 DIAGNOSIS — F419 Anxiety disorder, unspecified: Secondary | ICD-10-CM

## 2023-12-05 DIAGNOSIS — I272 Pulmonary hypertension, unspecified: Secondary | ICD-10-CM

## 2023-12-05 DIAGNOSIS — E66813 Obesity, class 3: Secondary | ICD-10-CM

## 2023-12-05 LAB — LIPID PANEL
Cholesterol: 208 mg/dL — ABNORMAL HIGH (ref 0–200)
HDL: 66.5 mg/dL (ref >39.00)
LDL Cholesterol: 96 mg/dL (ref 0–99)
NonHDL: 141.67
Total CHOL/HDL Ratio: 3
Triglycerides: 230 mg/dL — ABNORMAL HIGH (ref 0.0–149.0)
VLDL: 46 mg/dL — ABNORMAL HIGH (ref 0.0–40.0)

## 2023-12-05 LAB — MICROALBUMIN / CREATININE URINE RATIO
Creatinine,U: 62.6 mg/dL
Microalb Creat Ratio: 92.4 mg/g — ABNORMAL HIGH (ref 0.0–30.0)
Microalb, Ur: 5.8 mg/dL — ABNORMAL HIGH (ref 0.0–1.9)

## 2023-12-05 LAB — URIC ACID: Uric Acid, Serum: 6 mg/dL (ref 2.4–7.0)

## 2023-12-05 MED ORDER — TRAZODONE HCL 300 MG PO TABS: 300.0000 mg | ORAL_TABLET | Freq: Every day | ORAL | 3 refills | Status: DC

## 2023-12-05 MED ORDER — BUSPIRONE HCL 30 MG PO TABS: 30.0000 mg | ORAL_TABLET | Freq: Two times a day (BID) | ORAL | 3 refills | Status: AC

## 2023-12-05 NOTE — Progress Notes (Unsigned)
   12/05/2023  Patient ID: Michele Owens, female   DOB: March 24, 1961, 63 y.o.   MRN: [id number]  Patient outreach for scheduled telephone visit.  I was able to connect with patient, but she states she cannot do a telephone visit and is supposed to come see me at the office in two weeks.  A follow-up visit is scheduled with Dr. Sebastian on 9/16 at 2pm that day, so I told the patient I would plan to see her while she is there that day and asked that she bring all of her medications in.  Michele Owens, PharmD, DPLA

## 2023-12-05 NOTE — Progress Notes (Signed)
 Assessment & Plan   Assessment/Plan:   Assessment and Plan Assessment & Plan Type 2 diabetes mellitus with diabetic neuropathy Type 2 diabetes mellitus is well-controlled with an A1c of 6.8% as of June 2025. Diabetic neuropathy is present, and she follows with orthopedics for chronic foot pain and diabetic neuropathy. - Order diabetic shoes - Continue Farxiga  10 mg daily  Hypertension Hypertension is at goal. Losartan  is used for blood pressure control and renal protection due to diabetes. - Continue losartan  25 mg daily  Hyperlipidemia Hyperlipidemia is managed with fenofibrate  for triglycerides and rosuvastatin  for cholesterol control. - Continue fenofibrate  145 mg daily - Continue rosuvastatin  10 mg QHS - Order cholesterol panel  Morbid obesity with obesity hypoventilation syndrome Morbid obesity with a BMI of 52. Obesity hypoventilation syndrome is present, contributing to respiratory issues.  Chronic diastolic heart failure, NYHA class III Chronic diastolic heart failure, NYHA class III, with debility due to COPD and morbid obesity. Managed with goal-directed therapy including spironolactone , Farxiga , and torsemide . - Continue spironolactone  25 mg daily - Continue torsemide  40 mg BID  Chronic obstructive pulmonary disease with chronic hypoxemic respiratory failure COPD with chronic hypoxemic respiratory failure necessitating chronic oxygen  therapy at 2 L/min. Shortness of breath reported, and she is on inhaled steroids. - Refer to pulmonology for COPD management - Continue chronic oxygen  therapy at 2 L/min - Continue Anoro Ellipta  one puff daily - Continue budesonide  (Pulmicort ) 0.25 mg/2 mL nebulizer BID  Osteoarthritis Chronic osteoarthritis contributing to debility.  Coronary artery disease with stable angina Coronary artery disease with stable angina managed with aspirin  and nitroglycerin . - Continue aspirin  81 mg daily - Continue nitroglycerin  as needed for  angina  Hypothyroidism with multinodular goiter and history of thyroid  cancer Hypothyroidism with a history of multinodular goiter and thyroid  cancer. Recent thyroid  testing showed normal free T4 and elevated TSH at 4. - Refer to endocrinology for hypothyroidism management - Continue levothyroxine  125 mcg daily  Obstructive sleep apnea Obstructive sleep apnea is present, contributing to respiratory issues. - Refer to sleep medicine for obstructive sleep apnea management  Seasonal allergic rhinitis Seasonal allergic rhinitis managed with levocetirizine. - Continue levocetirizine 5 mg QHS  Vitamin D  deficiency Vitamin D  deficiency managed with ergocalciferol . - Continue ergocalciferol  50,000 units weekly  Gastroesophageal reflux disease (GERD) GERD managed with omeprazole . - Continue omeprazole  40 mg daily  Iron deficiency anemia Iron deficiency anemia managed with ferrous sulfate . - Continue ferrous sulfate  25 mg daily  Anxiety and major depressive disorder Anxiety and major depressive disorder with significant stressors reported. Current medications include buspirone  and trazodone . Adjustment of medications discussed to improve management. She expressed significant stress related to family issues and a desire for in-person support. - Increase buspirone  to 30 mg twice daily - Increase trazodone  to 300 mg at night - Referral to psychiatry and psychology for further management - Coordinate with case management social worker for additional support  Chronic foot pain Chronic foot pain associated with diabetic neuropathy. Follow-up with orthopedics is ongoing.  Pulmonary hypertension History of pulmonary hypertension associated with chronic hypoxemic respiratory failure.  POLYPHARMACY - Connect with pharmacist Channing Mealing for medication management  General Health Maintenance General health maintenance includes vaccinations and screenings. - Order hepatitis C screening -  Administer shingles vaccine - Administer pneumonia vaccine  Follow-Up - Schedule follow-up appointment in 2 weeks to assess response to medication changes and address anxiety - Ensure connection with pulmonology, psychiatry, psychology, and endocrinology for ongoing management      Medications Discontinued During  This Encounter  Medication Reason   amoxicillin -clavulanate (AUGMENTIN ) 875-125 MG tablet    doxycycline  (VIBRAMYCIN ) 100 MG capsule    polyethylene glycol (MIRALAX  / GLYCOLAX ) 17 g packet    doxycycline  (VIBRAMYCIN ) 100 MG capsule    busPIRone  (BUSPAR ) 5 MG tablet    traZODone  (DESYREL ) 50 MG tablet     Return in about 2 weeks (around 12/19/2023) for anxiety.        Subjective:   Encounter date: 12/05/2023  Michele Owens is a 63 y.o. female who has Tobacco abuse; OSA (obstructive sleep apnea); Non-insulin  dependent type 2 diabetes mellitus (HCC); Leukocytosis; Controlled type 2 diabetes mellitus without complication, without long-term current use of insulin  (HCC); Depression with anxiety; Benign essential HTN; Normocytic anemia; Chest pain; Obesity hypoventilation syndrome (HCC); CAP (community acquired pneumonia); Multinodular goiter w/ dominant right thyroid  nodule; Pulmonary hypertension (HCC); Chronic hypoxic respiratory failure (HCC); Acute hypoxemic respiratory failure (HCC); Acute pulmonary edema (HCC); HLD (hyperlipidemia); COPD (chronic obstructive pulmonary disease) (HCC); Gout; DVT (deep vein thrombosis) in pregnancy; Thyroid  cancer (HCC); Hypocalcemia; Hypomagnesemia; Atypical chest pain; Family history of thyroid  cancer; Encounter for geriatric assessment; COPD exacerbation (HCC); Acute and chronic respiratory failure with hypercapnia (HCC); Pulmonary HTN (HCC); Essential hypertension; Obesity; CHF (congestive heart failure) (HCC); Fall at home, initial encounter; HNP (herniated nucleus pulposus), thoracic; Severe obesity (BMI >= 40) (HCC); Acute on chronic  respiratory failure with hypoxia (HCC); Hemoptysis; Encephalopathy; Chronic diastolic CHF (congestive heart failure) (HCC); Sepsis (HCC); Respiratory failure with hypercapnia (HCC); Anemia; Acute kidney injury superimposed on chronic kidney disease (HCC); Hypothyroidism; Chronic pain disorder; Acute on chronic diastolic (congestive) heart failure (HCC); Fever; Current mild episode of major depressive disorder without prior episode (HCC); Diabetic peripheral neuropathy (HCC); Anxiety; Acute on chronic diastolic CHF (congestive heart failure) (HCC); Acute respiratory failure with hypoxia and hypercapnia (HCC); Acute respiratory failure with hypercapnia (HCC); Delirium due to multiple etiologies, acute, hyperactive; At risk for polypharmacy; Oxygen  dependent; History of COPD; Bipolar disorder (HCC); Cellulitis; UTI (urinary tract infection); Hypokalemia; Vitamin D  deficiency; Chronic idiopathic constipation; Mild episode of recurrent major depressive disorder (HCC); GAD (generalized anxiety disorder); and Former smoker on their problem list..   She  has a past medical history of Anxiety, Arthritis, Asthma, Chronic diastolic CHF (congestive heart failure) (HCC), COPD (chronic obstructive pulmonary disease) (HCC), Depression, Diabetes mellitus without complication (HCC), GERD (gastroesophageal reflux disease), Gout, Headache, History of DVT of lower extremity, Hypertension, Morbid obesity with BMI of 45.0-49.9, adult (HCC), and Thyroid  cancer (HCC) (09/23/2016)..   She presents with chief complaint of Medical Management of Chronic Issues (1 month follow up DM, HTN and HLD. Pt is not fasting today//HM due- diabetic eye and foot exam, vaccinations and mammogram /), Diabetes Management Plan (Pt is request diabetic shoes ), and Medication Refill (Rx refill are needed; 30-90 day supply ) .   Discussed the use of AI scribe software for clinical note transcription with the patient, who gave verbal consent to  proceed.  History of Present Illness Michele Owens is a 63 year old female with diabetes, hypertension, hyperlipidemia, and chronic heart failure who presents for follow-up and medication management.  She is here for chronic management of diabetes, hypertension, hyperlipidemia, and chronic heart failure. She requests diabetic shoes and medication refills. Her last A1c was 6.8 in June 2025, which is at goal. She takes Farxiga  10 mg daily for diabetes and heart failure.  She is morbidly obese with a BMI of 52 and has chronic diastolic heart failure, COPD, and significant  osteoarthritis. She is on chronic oxygen  therapy at 2 liters per minute for COPD. For hyperlipidemia, she takes fenofibrate  145 mg daily and rosuvastatin  10 mg QHS. For hypertension and renal protection due to diabetes, she takes losartan  25 mg daily. She also takes spironolactone  25 mg for heart failure and torsemide  40 mg BID.  She has a history of insomnia and major depressive disorder, for which she takes trazodone  100 mg at night. For COPD, she uses a Breo Ellipta inhaler once daily and budesonide  (Pulmicort ) 0.25 mg/2 mL nebulizer BID. She also takes potassium chloride  10 mEq BID due to chronic diuresis from torsemide . For GERD, she takes omeprazole  40 mg daily.  She has coronary artery disease with stable angina and uses nitroglycerin  as needed. She takes aspirin  81 mg daily for CAD. She has hypothyroidism, taking levothyroxine  125 mcg daily, and a history of multinodular goiter. Recent thyroid  testing showed normal free T4 and elevated TSH at 4. She has a history of pulmonary hypertension and chronic hypoxemic respiratory failure associated with COPD, necessitating oxygen  use.  She has a history of bipolar disorder. She reports significant stress related to family issues, stating 'I'm really stressed out with my son and my daughter.' No intent to harm herself but expresses a need to talk to someone about her stress and  anxiety. She takes buspirone  5 mg TID for anxiety and depression.  She has obstructive sleep apnea and obesity hypoventilation syndrome. She reports being short of breath and mentions 'I've been getting short of breath.' She has a history of iron deficiency anemia, taking ferrous sulfate  25 mg daily. She also takes vitamin D , ergocalciferol  50,000 units weekly for deficiency.  She has a history of pneumonia in May 2025 and is due for shingles and pneumonia vaccinations. She is a former smoker, having quit 24-25 years ago, and denies any current use of drugs or alcohol. She has a nurse who visits her home. She follows with orthopedics for chronic foot pain and diabetic neuropathy.     ROS  Past Surgical History:  Procedure Laterality Date   BUNIONECTOMY Bilateral    CARDIAC CATHETERIZATION N/A 06/23/2015   Procedure: Right/Left Heart Cath and Coronary Angiography;  Surgeon: Ezra GORMAN Shuck, MD;  Location: [facility] INVASIVE CV LAB;  Service: Cardiovascular;  Laterality: N/A;   COLONOSCOPY WITH PROPOFOL  N/A 12/15/2015   Procedure: COLONOSCOPY WITH PROPOFOL ;  Surgeon: Norleen Hint, MD;  Location: [facility] ENDOSCOPY;  Service: Endoscopy;  Laterality: N/A;   RIGHT HEART CATH N/A 10/01/2019   Procedure: RIGHT HEART CATH;  Surgeon: Shuck Ezra GORMAN, MD;  Location: [facility] INVASIVE CV LAB;  Service: Cardiovascular;  Laterality: N/A;   THYROIDECTOMY  09/19/2016   THYROIDECTOMY N/A 09/19/2016   Procedure: TOTAL THYROIDECTOMY;  Surgeon: Eletha Boas, MD;  Location: [facility] OR;  Service: General;  Laterality: N/A;   TONSILLECTOMY     TOTAL ABDOMINAL HYSTERECTOMY  07/14/10    Outpatient Medications Prior to Visit  Medication Sig Dispense Refill   ACETAMINOPHEN  PO Take 1,300-2,050 mg by mouth daily as needed for moderate pain (pain score 4-6) or headache.     albuterol  (VENTOLIN  HFA) 108 (90 Base) MCG/ACT inhaler Inhale 1-2 puffs into the lungs every 6 (six) hours as needed for wheezing or shortness of breath. 1 each 0    allopurinol  (ZYLOPRIM ) 100 MG tablet Take 1 tablet (100 mg total) by mouth daily. 90 tablet 3   aspirin  EC 81 MG tablet Take 1 tablet (81 mg total) by mouth daily. Swallow whole. 90 tablet  3   aspirin  EC 81 MG tablet Take 81 mg by mouth daily. Swallow whole.     blood glucose meter kit and supplies KIT Dispense based on patient and insurance preference. Use up to four times daily as directed. 1 each 0   Blood Glucose Monitoring Suppl DEVI 1 each by Does not apply route in the morning, at noon, and at bedtime. May substitute to any manufacturer covered by patient's insurance. 1 each 0   budesonide  (PULMICORT ) 0.25 MG/2ML nebulizer solution Inhale 2 mLs (0.25 mg total) by nebulization 2 (two) times daily. 120 mL 0   butalbital -acetaminophen -caffeine  (FIORICET ) 50-325-40 MG tablet Take 1-2 tablets by mouth every 6 (six) hours as needed for headache. 20 tablet 0   carvedilol  (COREG ) 6.25 MG tablet Take 1 tablet (6.25 mg total) by mouth 2 (two) times daily. 60 tablet 11   colchicine  0.6 MG tablet TAKE 1 TABLET (0.6 MG TOTAL) BY MOUTH 2 (TWO) TIMES DAILY FOR 5 DAYS. 10 tablet 0   dicyclomine  (BENTYL ) 20 MG tablet TAKE 1 TABLET (20 MG TOTAL) BY MOUTH 2 (TWO) TIMES DAILY. 20 tablet 0   EPINEPHrine  0.3 mg/0.3 mL IJ SOAJ injection Inject 0.3 mg into the muscle once as needed for anaphylaxis.     FARXIGA  10 MG TABS tablet Take 1 tablet (10 mg total) by mouth daily before breakfast. 30 tablet 11   fenofibrate  (TRICOR ) 145 MG tablet Take 1 tablet (145 mg total) by mouth daily. 30 tablet 11   FEROSUL 325 (65 Fe) MG tablet Take 1 tablet (325 mg total) by mouth daily. 30 tablet 1   Glucose Blood (BLOOD GLUCOSE TEST STRIPS) STRP 1 each by In Vitro route in the morning, at noon, and at bedtime. May substitute to any manufacturer covered by patient's insurance. 100 strip 0   Lancet Device MISC 1 each by Does not apply route in the morning, at noon, and at bedtime. May substitute to any manufacturer covered by patient's  insurance. 1 each 0   Lancets Misc. MISC 1 each by Does not apply route in the morning, at noon, and at bedtime. May substitute to any manufacturer covered by patient's insurance. 100 each 0   levocetirizine (XYZAL ) 5 MG tablet Take 1 tablet (5 mg total) by mouth at bedtime. 30 tablet 0   levothyroxine  (SYNTHROID ) 125 MCG tablet Take 1 tablet (125 mcg total) by mouth at bedtime. 90 tablet 0   losartan  (COZAAR ) 25 MG tablet Take 1 tablet (25 mg total) by mouth daily. 30 tablet 11   omeprazole  (PRILOSEC) 20 MG capsule Take 2 capsules (40 mg total) by mouth daily. 180 capsule 1   potassium chloride  (KLOR-CON ) 10 MEQ tablet Take 1 tablet (10 mEq total) by mouth 2 (two) times daily. 60 tablet 6   rosuvastatin  (CRESTOR ) 10 MG tablet Take 1 tablet (10 mg total) by mouth every evening. 30 tablet 11   spironolactone  (ALDACTONE ) 25 MG tablet Take 1 tablet (25 mg total) by mouth daily. 30 tablet 11   umeclidinium-vilanterol (ANORO ELLIPTA ) 62.5-25 MCG/ACT AEPB Inhale 1 puff into the lungs daily. 98 each 0   Vitamin D , Ergocalciferol , (DRISDOL ) 1.25 MG (50000 UNIT) CAPS capsule Take 1 capsule (50,000 Units total) by mouth every 7 (seven) days. 12 capsule 0   busPIRone  (BUSPAR ) 5 MG tablet Take 1 tablet (5 mg total) by mouth 3 (three) times daily. 270 tablet 0   doxycycline  (VIBRAMYCIN ) 100 MG capsule Take 1 capsule (100 mg total) by mouth 2 (two) times  daily for 7 days. 14 capsule 0   traZODone  (DESYREL ) 50 MG tablet Take 2 tablets (100 mg total) by mouth at bedtime. 60 tablet 0   calcitRIOL  (ROCALTROL ) 0.5 MCG capsule Take 1 capsule (0.5 mcg total) by mouth daily. (Patient not taking: Reported on 12/05/2023) 30 capsule 0   clotrimazole  (LOTRIMIN ) 1 % cream Apply topically 2 (two) times daily. (Patient not taking: Reported on 12/05/2023) 30 g 0   nitroGLYCERIN  (NITROSTAT ) 0.4 MG SL tablet Place 1 tablet (0.4 mg total) under the tongue every 5 (five) minutes as needed for chest pain. (Patient not taking: Reported  on 12/05/2023) 12 tablet 6   nystatin  cream (MYCOSTATIN ) Apply 1 Application topically 2 (two) times daily as needed for dry skin. (Patient not taking: Reported on 12/05/2023)     ondansetron  (ZOFRAN -ODT) 4 MG disintegrating tablet Take 1 tablet (4 mg total) by mouth every 8 (eight) hours as needed. (Patient not taking: Reported on 12/05/2023) 20 tablet 0   OXYGEN  Inhale 2 L into the lungs continuous. (Patient not taking: Reported on 12/05/2023)     senna-docusate (SENOKOT-S) 8.6-50 MG tablet Take 1 tablet by mouth daily. (Patient not taking: Reported on 12/05/2023) 30 tablet 5   torsemide  (DEMADEX ) 20 MG tablet Take 2 tablets (40 mg total) by mouth 2 (two) times daily. (Patient not taking: Reported on 12/05/2023) 120 tablet 11   amoxicillin -clavulanate (AUGMENTIN ) 875-125 MG tablet Take 1 tablet by mouth every 12 (twelve) hours. (Patient not taking: Reported on 12/05/2023) 14 tablet 0   doxycycline  (VIBRAMYCIN ) 100 MG capsule Take 1 capsule (100 mg total) by mouth 2 (two) times daily. (Patient not taking: Reported on 12/05/2023) 14 capsule 0   polyethylene glycol (MIRALAX  / GLYCOLAX ) 17 g packet Take 17 g by mouth daily as needed for mild constipation. (Patient not taking: Reported on 12/05/2023) 30 each 0   No facility-administered medications prior to visit.    Family History  Problem Relation Age of Onset   Cancer Father        thought to be due to exposure to concrete   Diabetes Mother    Thyroid  cancer Mother        dx in her 84s-60s   Cancer Maternal Uncle        2 uncles with cancer NOS   Brain cancer Paternal Aunt    Cancer Cousin        maternal first cousin - NOS   Cancer Cousin        maternal first cousin - NOS    Social History   Socioeconomic History   Marital status: Single    Spouse name: Not on file   Number of children: 1   Years of education: Not on file   Highest education level: Not on file  Occupational History   Occupation: disabled  Tobacco Use   Smoking  status: Former    Current packs/day: 0.00    Average packs/day: 0.5 packs/day for 41.0 years (20.5 ttl pk-yrs)    Types: Cigarettes    Start date: 07/1975    Quit date: 07/2016    Years since quitting: 7.4   Smokeless tobacco: Never   Tobacco comments:    Pt stop smoking 07/04/2011  Vaping Use   Vaping status: Never Used  Substance and Sexual Activity   Alcohol use: Not Currently    Comment: quit 12 years ago   Drug use: Not Currently    Types: Marijuana, Cocaine    Comment: quit 12 years ago  Sexual activity: Yes  Other Topics Concern   Not on file  Social History Narrative   Not on file   Social Drivers of Health   Financial Resource Strain: Low Risk  (10/24/2022)   Overall Financial Resource Strain (CARDIA)    Difficulty of Paying Living Expenses: Not very hard  Food Insecurity: No Food Insecurity (09/09/2023)   Hunger Vital Sign    Worried About Running Out of Food in the Last Year: Never true    Ran Out of Food in the Last Year: Never true  Transportation Needs: No Transportation Needs (09/09/2023)   PRAPARE - Administrator, Civil Service (Medical): No    Lack of Transportation (Non-Medical): No  Physical Activity: Inactive (02/01/2021)   Exercise Vital Sign    Days of Exercise per Week: 0 days    Minutes of Exercise per Session: 0 min  Stress: Stress Concern Present (02/23/2021)   [employer] of Occupational Health - Occupational Stress Questionnaire    Feeling of Stress : To some extent  Social Connections: Unknown (08/21/2021)   Received from [facility]   Social Network    Social Network: Not on file  Intimate Partner Violence: Not At Risk (09/09/2023)   Humiliation, Afraid, Rape, and Kick questionnaire    Fear of Current or Ex-Partner: No    Emotionally Abused: No    Physically Abused: No    Sexually Abused: No                                                                                                  Objective:  Physical  Exam: BP 116/65 (BP Location: Left Arm, Patient Position: Sitting, Cuff Size: Large)   Pulse 78   Temp (!) 97 F (36.1 C) (Temporal)   Resp 18   Wt (!) 316 lb 6.4 oz (143.5 kg)   SpO2 94%   BMI 52.65 kg/m    Physical Exam MEASUREMENTS: BMI- 52.0. GENERAL: Alert, cooperative, well developed, no acute distress HEENT: Normocephalic, normal oropharynx, moist mucous membranes CHEST: Clear to auscultation bilaterally, no wheezes, rhonchi, or crackles. 2L/min of O2 CARDIOVASCULAR: Normal heart rate and rhythm, S1 and S2 normal without murmurs ABDOMEN: Soft, non-tender, non-distended, without organomegaly, normal bowel sounds EXTREMITIES: No cyanosis or edema NEUROLOGICAL: Cranial nerves grossly intact, moves all extremities without gross motor or sensory deficit PSYCH: Tearful,    Physical Exam  LONG TERM MONITOR-LIVE TELEMETRY (3-14 DAYS) Result Date: 12/03/2023 Patch Wear Time:  12 days and 0 hours (2025-02-12T15:41:24-0500 to 2025-02-24T16:19:24-0500) Patient had a min HR of 52 bpm, max HR of 214 bpm, and avg HR of 77 bpm. Predominant underlying rhythm was Sinus Rhythm. 2 Supraventricular Tachycardia runs occurred, the run with the fastest interval lasting 2 mins 22 secs with a max rate of 214 bpm (avg 190 bpm); the run with the fastest interval was also the longest. True duration of Supraventricular Tachycardia difficult to ascertain due to artifact. Isolated SVEs were rare (<1.0%), SVE Couplets were rare (<1.0%), and SVE Triplets were rare (<1.0%). Isolated VEs were rare (<1.0%, 2900), VE Couplets were rare (<1.0%, 42),  and VE Triplets were rare (<1.0%, 1). Ventricular Bigeminy and Trigeminy were present. Inverted QRS complexes possibly due to inverted placement of device. Conclusion: 1. Predominantly NSR. 2. Rare PVCs and PACs. 3. Possible run of SVT but obscured by artifact.   DG Chest 2 View Result Date: 11/29/2023 CLINICAL DATA:  cp EXAM: CHEST - 2 VIEW COMPARISON:  October 19, 2023  FINDINGS: The patient is rotated towards the right. Low lung volumes. No focal airspace consolidation or pleural effusion. Hazy attenuation of the left lung base on the frontal radiograph with blunting of the left costophrenic angle posteriorly. No pneumothorax. Mild cardiomegaly. Prominence of the right hilar region and paratracheal stripe, likely rotational. No acute fracture or destructive lesions. Multilevel thoracic osteophytosis. IMPRESSION: Low lung volumes. Hazy attenuation of the left lung base on the frontal radiograph, may represent atelectasis with a small pleural effusion. Electronically Signed   By: Rogelia Myers M.D.   On: 11/29/2023 16:21   CT Knee Left Wo Contrast Result Date: 10/19/2023 CLINICAL DATA:  Bilateral leg pain and lower back pain. Possible fracture on same day radiographs EXAM: CT OF THE LEFT KNEE WITHOUT CONTRAST TECHNIQUE: Multidetector CT imaging of the left knee was performed according to the standard protocol. Multiplanar CT image reconstructions were also generated. RADIATION DOSE REDUCTION: This exam was performed according to the departmental dose-optimization program which includes automated exposure control, adjustment of the mA and/or kV according to patient size and/or use of iterative reconstruction technique. COMPARISON:  Same day knee radiographs FINDINGS: Bones/Joint/Cartilage No acute fracture. No dislocation. The abnormality on same-day radiograph corresponds with a vascular channel in the lateral tibial plateau. Tricompartmental degenerative arthritis greatest in the patellofemoral and lateral compartments where it is moderate. Small knee joint effusion. Ligaments Suboptimally assessed by CT. Muscles and Tendons No acute abnormality. Soft tissues Unremarkable. IMPRESSION: 1. No acute fracture.  Small knee joint effusion. 2. Tricompartmental degenerative arthritis greatest in the patellofemoral and lateral compartments. Electronically Signed   By: Norman Gatlin  M.D.   On: 10/19/2023 20:35   DG Lumbar Spine Complete Result Date: 10/19/2023 CLINICAL DATA:  Initial evaluation for acute lower back pain. EXAM: LUMBAR SPINE - COMPLETE 4+ VIEW COMPARISON:  Prior study from 06/26/2021 FINDINGS: Examination mildly limited by habitus. Five non rib-bearing lumbar type vertebral bodies. Mild dextroscoliosis. Vertebral body height maintained without acute or chronic fracture. Visualized pelvis intact. Moderate degenerative endplate spurring present throughout the lumbar spine. No visible soft tissue abnormality. IMPRESSION: 1. No radiographic evidence for acute osseous abnormality within the lumbar spine. 2. Moderate degenerative endplate spurring throughout the lumbar spine. Electronically Signed   By: Morene Hoard M.D.   On: 10/19/2023 19:27   DG Knee Complete 4 Views Right Result Date: 10/19/2023 CLINICAL DATA:  Initial evaluation for acute pain and swelling. EXAM: RIGHT KNEE - COMPLETE 4+ VIEW COMPARISON:  Prior study from 05/13/2023 FINDINGS: Diffuse osteopenia, somewhat limiting evaluation for fine osseous detail. No acute fracture dislocation. Moderate tricompartmental degenerative osteoarthrosis. No joint effusion. No visible soft tissue abnormality. IMPRESSION: 1. No acute osseous abnormality. 2. Moderate tricompartmental degenerative osteoarthrosis. Electronically Signed   By: Morene Hoard M.D.   On: 10/19/2023 19:25   DG Knee Complete 4 Views Left Result Date: 10/19/2023 CLINICAL DATA:  Initial evaluation for acute pain and swelling EXAM: LEFT KNEE - COMPLETE 4+ VIEW COMPARISON:  None Available. FINDINGS: Diffuse osteopenia, limiting assessment for fine osseous detail. There is question of a subtle vertically oriented lucency extending through the lateral aspect of the  tibial plateau. While this could reflect a small nutrient foramen or possibly summation of shadows, a possible subtle acute nondisplaced fractures difficult to exclude. No other acute  fracture dislocation. Small joint effusion. Moderate tricompartmental degenerative osteoarthrosis no visible soft tissue abnormality. IMPRESSION: 1. Question subtle vertically oriented lucency extending through the lateral aspect of the tibial plateau. While this could reflect a small nutrient foramen or possibly summation of shadows, a possible subtle acute nondisplaced fracture is difficult to exclude. Correlation with physical exam for possible pain at this location recommended. Additionally, finding could be further assessed with dedicated CT as warranted. 2. Small joint effusion. 3. Moderate tricompartmental degenerative osteoarthrosis. Electronically Signed   By: Morene Hoard M.D.   On: 10/19/2023 19:24   DG Chest Portable 1 View Result Date: 10/19/2023 CLINICAL DATA:  Dyspnea. Shortness of breath on exertion. Home oxygen . EXAM: PORTABLE CHEST 1 VIEW COMPARISON:  06/10/2023 FINDINGS: Atherosclerotic calcification of the aortic arch. Mild enlargement of the cardiopericardial silhouette. Bandlike densities in the lung bases and right mid lung favor atelectasis. Thoracic spondylosis. Indistinct pulmonary vasculature.  Thyroidectomy clips noted. IMPRESSION: 1. Mild enlargement of the cardiopericardial silhouette with indistinct pulmonary vasculature. 2. Bandlike densities in the lung bases and right mid lung favor atelectasis. 3. Thoracic spondylosis. Electronically Signed   By: Ryan Salvage M.D.   On: 10/19/2023 17:51   CT ABDOMEN PELVIS W CONTRAST Result Date: 09/06/2023 CLINICAL DATA:  Abdomen pain EXAM: CT ABDOMEN AND PELVIS WITH CONTRAST TECHNIQUE: Multidetector CT imaging of the abdomen and pelvis was performed using the standard protocol following bolus administration of intravenous contrast. RADIATION DOSE REDUCTION: This exam was performed according to the departmental dose-optimization program which includes automated exposure control, adjustment of the mA and/or kV according to patient  size and/or use of iterative reconstruction technique. CONTRAST:  OMNIPAQUE  IOHEXOL  350 MG/ML SOLN COMPARISON:  CT 08/29/2023 FINDINGS: Lower chest: Lung bases demonstrate hazy areas of atelectasis. Hepatobiliary: Liver appears enlarged measuring 21 cm craniocaudad. Diffuse hepatic steatosis. No calcified gallstone or biliary dilatation Pancreas: Unremarkable. No pancreatic ductal dilatation or surrounding inflammatory changes. Spleen: Normal in size without focal abnormality. Adrenals/Urinary Tract: Adrenal glands are unremarkable. Kidneys are normal, without renal calculi, focal lesion, or hydronephrosis. Bladder is unremarkable. Stomach/Bowel: Stomach is within normal limits. No evidence of bowel wall thickening, distention, or inflammatory changes. Vascular/Lymphatic: Aortic atherosclerosis. No enlarged abdominal or pelvic lymph nodes. Reproductive: Status post hysterectomy. No adnexal masses. Other: Negative for pelvic effusion or free air. New skin thickening and soft tissue stranding involving the periumbilical region and infraumbilical pannus. No organized fluid collection. Musculoskeletal: No acute osseous abnormality. IMPRESSION: 1. Negative for acute intra-abdominal or pelvic abnormality. 2. Hepatomegaly with hepatic steatosis. 3. New skin thickening and soft tissue stranding involving the periumbilical region and infraumbilical pannus, correlate for cellulitis. No organized fluid collection. 4. Aortic atherosclerosis. Aortic Atherosclerosis (ICD10-I70.0). Electronically Signed   By: Luke Bun M.D.   On: 09/06/2023 21:08    Recent Results (from the past 2160 hours)  Comprehensive metabolic panel     Status: Abnormal   Collection Time: 09/06/23  6:36 PM  Result Value Ref Range   Sodium 147 (H) 135 - 145 mmol/L   Potassium 4.7 3.5 - 5.1 mmol/L    Comment: HEMOLYSIS AT THIS LEVEL MAY AFFECT RESULT   Chloride 103 98 - 111 mmol/L   CO2 34 (H) 22 - 32 mmol/L   Glucose, Bld 108 (H) 70 - 99  mg/dL    Comment: Glucose reference range  applies only to samples taken after fasting for at least 8 hours.   BUN 16 8 - 23 mg/dL   Creatinine, Ser 9.05 0.44 - 1.00 mg/dL   Calcium  8.8 (L) 8.9 - 10.3 mg/dL   Total Protein 6.6 6.5 - 8.1 g/dL   Albumin  3.5 3.5 - 5.0 g/dL   AST 33 15 - 41 U/L    Comment: HEMOLYSIS AT THIS LEVEL MAY AFFECT RESULT   ALT 36 0 - 44 U/L    Comment: HEMOLYSIS AT THIS LEVEL MAY AFFECT RESULT   Alkaline Phosphatase 49 38 - 126 U/L   Total Bilirubin 0.9 0.0 - 1.2 mg/dL    Comment: HEMOLYSIS AT THIS LEVEL MAY AFFECT RESULT   GFR, Estimated >60 >60 mL/min    Comment: (NOTE) Calculated using the CKD-EPI Creatinine Equation (2021)    Anion gap 10 5 - 15    Comment: Performed at [facility], [name] [place], [place] [address]  CBC with Differential     Status: Abnormal   Collection Time: 09/06/23  6:36 PM  Result Value Ref Range   WBC 15.8 (H) 4.0 - 10.5 K/uL   RBC 3.62 (L) 3.87 - 5.11 MIL/uL   Hemoglobin 10.9 (L) 12.0 - 15.0 g/dL   HCT 62.7 63.9 - 53.9 %   MCV 102.8 (H) 80.0 - 100.0 fL   MCH 30.1 26.0 - 34.0 pg   MCHC 29.3 (L) 30.0 - 36.0 g/dL   RDW 86.8 88.4 - 84.4 %   Platelets 264 150 - 400 K/uL   nRBC 0.0 0.0 - 0.2 %   Neutrophils Relative % 80 %   Neutro Abs 12.7 (H) 1.7 - 7.7 K/uL   Lymphocytes Relative 12 %   Lymphs Abs 1.9 0.7 - 4.0 K/uL   Monocytes Relative 5 %   Monocytes Absolute 0.9 0.1 - 1.0 K/uL   Eosinophils Relative 2 %   Eosinophils Absolute 0.3 0.0 - 0.5 K/uL   Basophils Relative 0 %   Basophils Absolute 0.1 0.0 - 0.1 K/uL   Immature Granulocytes 1 %   Abs Immature Granulocytes 0.13 (H) 0.00 - 0.07 K/uL    Comment: Performed at [facility], [address], [place], [place] [address]  Brain natriuretic peptide     Status: None   Collection Time: 09/06/23  6:36 PM  Result Value Ref Range   B Natriuretic Peptide 35.0 0.0 - 100.0 pg/mL    Comment: Performed at [facility], [address], [place],  [place] [address]  Rapid urine drug screen (hospital performed)     Status: Abnormal   Collection Time: 09/07/23  1:37 PM  Result Value Ref Range   Opiates NONE DETECTED NONE DETECTED   Cocaine NONE DETECTED NONE DETECTED   Benzodiazepines NONE DETECTED NONE DETECTED   Amphetamines NONE DETECTED NONE DETECTED   Tetrahydrocannabinol POSITIVE (A) NONE DETECTED   Barbiturates NONE DETECTED NONE DETECTED    Comment: (NOTE) DRUG SCREEN FOR MEDICAL PURPOSES ONLY.  IF CONFIRMATION IS NEEDED FOR ANY PURPOSE, NOTIFY LAB WITHIN 5 DAYS.  LOWEST DETECTABLE LIMITS FOR URINE DRUG SCREEN Drug Class                     Cutoff (ng/mL) Amphetamine and metabolites    1000 Barbiturate and metabolites    200 Benzodiazepine                 200 Opiates and metabolites  300 Cocaine and metabolites        300 THC                            50 Performed at [id number], [address] [place], [place] [address]   Urinalysis, Routine w reflex microscopic -Urine, Clean Catch     Status: Abnormal   Collection Time: 09/09/23  7:24 AM  Result Value Ref Range   Color, Urine YELLOW YELLOW   APPearance HAZY (A) CLEAR   Specific Gravity, Urine 1.008 1.005 - 1.030   pH 7.0 5.0 - 8.0   Glucose, UA >=500 (A) NEGATIVE mg/dL   Hgb urine dipstick MODERATE (A) NEGATIVE   Bilirubin Urine NEGATIVE NEGATIVE   Ketones, ur NEGATIVE NEGATIVE mg/dL   Protein, ur NEGATIVE NEGATIVE mg/dL   Nitrite NEGATIVE NEGATIVE   Leukocytes,Ua LARGE (A) NEGATIVE   RBC / HPF 21-50 0 - 5 RBC/hpf   WBC, UA 21-50 0 - 5 WBC/hpf   Bacteria, UA RARE (A) NONE SEEN   Squamous Epithelial / HPF 0-5 0 - 5 /HPF   WBC Clumps PRESENT    Mucus PRESENT     Comment: Performed at [facility], [address] [place], [place] [address]  Urine Culture     Status: Abnormal   Collection Time: 09/09/23  8:34 AM   Specimen: Urine, Clean Catch  Result Value Ref Range   Specimen Description URINE, CLEAN CATCH    Special Requests       NONE Performed at [facility], [address] [place], [place] [address]    Culture >=100,000 COLONIES/mL ESCHERICHIA COLI (A)    Report Status 09/11/2023 FINAL    Organism ID, Bacteria ESCHERICHIA COLI (A)       Susceptibility   Escherichia coli - MIC*    AMPICILLIN <=2 SENSITIVE Sensitive     CEFAZOLIN  <=4 SENSITIVE Sensitive     CEFEPIME  <=0.12 SENSITIVE Sensitive     CEFTRIAXONE  <=0.25 SENSITIVE Sensitive     CIPROFLOXACIN <=0.25 SENSITIVE Sensitive     GENTAMICIN <=1 SENSITIVE Sensitive     IMIPENEM <=0.25 SENSITIVE Sensitive     NITROFURANTOIN <=16 SENSITIVE Sensitive     TRIMETH/SULFA <=20 SENSITIVE Sensitive     AMPICILLIN/SULBACTAM <=2 SENSITIVE Sensitive     PIP/TAZO <=4 SENSITIVE Sensitive ug/mL    * >=100,000 COLONIES/mL ESCHERICHIA COLI  Glucose, capillary     Status: Abnormal   Collection Time: 09/09/23 11:54 AM  Result Value Ref Range   Glucose-Capillary 154 (H) 70 - 99 mg/dL    Comment: Glucose reference range applies only to samples taken after fasting for at least 8 hours.  Hemoglobin A1c     Status: Abnormal   Collection Time: 09/09/23  2:38 PM  Result Value Ref Range   Hgb A1c MFr Bld 6.8 (H) 4.8 - 5.6 %    Comment: (NOTE) Diagnosis of Diabetes The following HbA1c ranges recommended by the American Diabetes Association (ADA) may be used as an aid in the diagnosis of diabetes mellitus.  Hemoglobin             Suggested A1C NGSP%              Diagnosis  <5.7                   Non Diabetic  5.7-6.4  Pre-Diabetic  >6.4                   Diabetic  <7.0                   Glycemic control for                       adults with diabetes.     Mean Plasma Glucose 148.46 mg/dL    Comment: Performed at [facility], [address], [place], [place] [address]  Comprehensive metabolic panel     Status: Abnormal   Collection Time: 09/09/23  2:38 PM  Result Value Ref Range   Sodium 139 135 - 145 mmol/L   Potassium 3.6 3.5 - 5.1  mmol/L   Chloride 89 (L) 98 - 111 mmol/L   CO2 33 (H) 22 - 32 mmol/L   Glucose, Bld 263 (H) 70 - 99 mg/dL    Comment: Glucose reference range applies only to samples taken after fasting for at least 8 hours.   BUN 22 8 - 23 mg/dL   Creatinine, Ser 8.55 (H) 0.44 - 1.00 mg/dL   Calcium  8.9 8.9 - 10.3 mg/dL   Total Protein 7.3 6.5 - 8.1 g/dL   Albumin  3.8 3.5 - 5.0 g/dL   AST 28 15 - 41 U/L   ALT 37 0 - 44 U/L   Alkaline Phosphatase 57 38 - 126 U/L   Total Bilirubin 0.5 0.0 - 1.2 mg/dL   GFR, Estimated 41 (L) >60 mL/min    Comment: (NOTE) Calculated using the CKD-EPI Creatinine Equation (2021)    Anion gap 17 (H) 5 - 15    Comment: Performed at [facility], [address], [place], [place] [address]  Glucose, capillary     Status: Abnormal   Collection Time: 09/09/23  4:30 PM  Result Value Ref Range   Glucose-Capillary 239 (H) 70 - 99 mg/dL    Comment: Glucose reference range applies only to samples taken after fasting for at least 8 hours.  CBC     Status: Abnormal   Collection Time: 09/10/23  5:59 AM  Result Value Ref Range   WBC 17.4 (H) 4.0 - 10.5 K/uL   RBC 3.83 (L) 3.87 - 5.11 MIL/uL   Hemoglobin 11.7 (L) 12.0 - 15.0 g/dL   HCT 63.1 63.9 - 53.9 %   MCV 96.1 80.0 - 100.0 fL   MCH 30.5 26.0 - 34.0 pg   MCHC 31.8 30.0 - 36.0 g/dL   RDW 86.6 88.4 - 84.4 %   Platelets 255 150 - 400 K/uL   nRBC 0.0 0.0 - 0.2 %    Comment: Performed at [facility], [address], [place], [place] [address]  Comprehensive metabolic panel     Status: Abnormal   Collection Time: 09/10/23  5:59 AM  Result Value Ref Range   Sodium 140 135 - 145 mmol/L   Potassium 3.3 (L) 3.5 - 5.1 mmol/L   Chloride 92 (L) 98 - 111 mmol/L   CO2 34 (H) 22 - 32 mmol/L   Glucose, Bld 91 70 - 99 mg/dL    Comment: Glucose reference range applies only to samples taken after fasting for at least 8 hours.   BUN 22 8 - 23 mg/dL   Creatinine, Ser 9.04 0.44 - 1.00 mg/dL   Calcium  8.5 (L) 8.9 - 10.3 mg/dL    Total Protein 6.8 6.5 - 8.1 g/dL  Albumin  3.5 3.5 - 5.0 g/dL   AST 18 15 - 41 U/L   ALT 28 0 - 44 U/L   Alkaline Phosphatase 50 38 - 126 U/L   Total Bilirubin 0.6 0.0 - 1.2 mg/dL   GFR, Estimated >39 >39 mL/min    Comment: (NOTE) Calculated using the CKD-EPI Creatinine Equation (2021)    Anion gap 14 5 - 15    Comment: Performed at [facility], [address] [place], [place] [address]  Glucose, capillary     Status: Abnormal   Collection Time: 09/10/23  8:32 AM  Result Value Ref Range   Glucose-Capillary 108 (H) 70 - 99 mg/dL    Comment: Glucose reference range applies only to samples taken after fasting for at least 8 hours.  Glucose, capillary     Status: Abnormal   Collection Time: 09/10/23 12:57 PM  Result Value Ref Range   Glucose-Capillary 212 (H) 70 - 99 mg/dL    Comment: Glucose reference range applies only to samples taken after fasting for at least 8 hours.  Glucose, capillary     Status: Abnormal   Collection Time: 09/10/23  5:13 PM  Result Value Ref Range   Glucose-Capillary 229 (H) 70 - 99 mg/dL    Comment: Glucose reference range applies only to samples taken after fasting for at least 8 hours.  Basic metabolic panel with GFR     Status: Abnormal   Collection Time: 09/11/23  3:57 AM  Result Value Ref Range   Sodium 140 135 - 145 mmol/L   Potassium 3.5 3.5 - 5.1 mmol/L   Chloride 92 (L) 98 - 111 mmol/L   CO2 36 (H) 22 - 32 mmol/L   Glucose, Bld 108 (H) 70 - 99 mg/dL    Comment: Glucose reference range applies only to samples taken after fasting for at least 8 hours.   BUN 20 8 - 23 mg/dL   Creatinine, Ser 8.96 (H) 0.44 - 1.00 mg/dL   Calcium  8.3 (L) 8.9 - 10.3 mg/dL   GFR, Estimated >39 >39 mL/min    Comment: (NOTE) Calculated using the CKD-EPI Creatinine Equation (2021)    Anion gap 12 5 - 15    Comment: Performed at [facility], [address] [place], [place] [address]  Magnesium      Status: None   Collection Time: 09/11/23  3:57 AM   Result Value Ref Range   Magnesium  2.3 1.7 - 2.4 mg/dL    Comment: Performed at [facility], [address], [place], [place] [address]  Glucose, capillary     Status: None   Collection Time: 09/11/23  7:46 AM  Result Value Ref Range   Glucose-Capillary 98 70 - 99 mg/dL    Comment: Glucose reference range applies only to samples taken after fasting for at least 8 hours.  Glucose, capillary     Status: Abnormal   Collection Time: 09/11/23 11:58 AM  Result Value Ref Range   Glucose-Capillary 150 (H) 70 - 99 mg/dL    Comment: Glucose reference range applies only to samples taken after fasting for at least 8 hours.  Glucose, capillary     Status: Abnormal   Collection Time: 09/11/23  5:30 PM  Result Value Ref Range   Glucose-Capillary 217 (H) 70 - 99 mg/dL    Comment: Glucose reference range applies only to samples taken after fasting for at least 8 hours.  CBC with Differential     Status: Abnormal  Collection Time: 10/19/23  6:32 PM  Result Value Ref Range   WBC 16.9 (H) 4.0 - 10.5 K/uL   RBC 3.93 3.87 - 5.11 MIL/uL   Hemoglobin 11.9 (L) 12.0 - 15.0 g/dL   HCT 60.4 63.9 - 53.9 %   MCV 100.5 (H) 80.0 - 100.0 fL   MCH 30.3 26.0 - 34.0 pg   MCHC 30.1 30.0 - 36.0 g/dL   RDW 87.2 88.4 - 84.4 %   Platelets 306 150 - 400 K/uL   nRBC 0.0 0.0 - 0.2 %   Neutrophils Relative % 76 %   Neutro Abs 12.8 (H) 1.7 - 7.7 K/uL   Lymphocytes Relative 16 %   Lymphs Abs 2.7 0.7 - 4.0 K/uL   Monocytes Relative 6 %   Monocytes Absolute 1.0 0.1 - 1.0 K/uL   Eosinophils Relative 1 %   Eosinophils Absolute 0.2 0.0 - 0.5 K/uL   Basophils Relative 0 %   Basophils Absolute 0.1 0.0 - 0.1 K/uL   Immature Granulocytes 1 %   Abs Immature Granulocytes 0.12 (H) 0.00 - 0.07 K/uL    Comment: Performed at [facility], [address], [place], [place] [address]  Comprehensive metabolic panel     Status: Abnormal   Collection Time: 10/19/23  6:32 PM  Result Value Ref Range   Sodium 137 135 - 145  mmol/L   Potassium 4.1 3.5 - 5.1 mmol/L   Chloride 93 (L) 98 - 111 mmol/L   CO2 32 22 - 32 mmol/L   Glucose, Bld 130 (H) 70 - 99 mg/dL    Comment: Glucose reference range applies only to samples taken after fasting for at least 8 hours.   BUN 17 8 - 23 mg/dL   Creatinine, Ser 8.99 0.44 - 1.00 mg/dL   Calcium  8.3 (L) 8.9 - 10.3 mg/dL   Total Protein 6.9 6.5 - 8.1 g/dL   Albumin  3.9 3.5 - 5.0 g/dL   AST 18 15 - 41 U/L   ALT 13 0 - 44 U/L   Alkaline Phosphatase 55 38 - 126 U/L   Total Bilirubin 0.3 0.0 - 1.2 mg/dL   GFR, Estimated >39 >39 mL/min    Comment: (NOTE) Calculated using the CKD-EPI Creatinine Equation (2021)    Anion gap 12 5 - 15    Comment: Performed at [facility], [address] [place], [place] [address]  Magnesium      Status: None   Collection Time: 10/19/23  6:32 PM  Result Value Ref Range   Magnesium  2.1 1.7 - 2.4 mg/dL    Comment: Performed at [facility], [address], [place], [place] [address]  Brain natriuretic peptide     Status: None   Collection Time: 10/19/23  6:32 PM  Result Value Ref Range   B Natriuretic Peptide 31.7 0.0 - 100.0 pg/mL    Comment: Performed at [facility], [address] [place], [place] [address]  Urinalysis, Routine w reflex microscopic -Urine, Clean Catch     Status: Abnormal   Collection Time: 10/19/23  8:25 PM  Result Value Ref Range   Color, Urine STRAW (A) YELLOW   APPearance CLEAR CLEAR   Specific Gravity, Urine 1.006 1.005 - 1.030   pH 5.0 5.0 - 8.0   Glucose, UA >=500 (A) NEGATIVE mg/dL   Hgb urine dipstick NEGATIVE NEGATIVE   Bilirubin Urine NEGATIVE NEGATIVE   Ketones, ur NEGATIVE NEGATIVE mg/dL   Protein, ur NEGATIVE NEGATIVE mg/dL  Nitrite NEGATIVE NEGATIVE   Leukocytes,Ua NEGATIVE NEGATIVE   RBC / HPF 0-5 0 - 5 RBC/hpf   WBC, UA 0-5 0 - 5 WBC/hpf   Bacteria, UA NONE SEEN NONE SEEN   Squamous Epithelial / HPF 0-5 0 - 5 /HPF   Mucus PRESENT     Comment: Performed at [facility]  Lab, [address], [place], [place] [address]  TSH     Status: Abnormal   Collection Time: 11/22/23 12:10 PM  Result Value Ref Range   TSH 39.047 (H) 0.350 - 4.500 uIU/mL    Comment: Performed by a 3rd Generation assay with a functional sensitivity of <=0.01 uIU/mL. Performed at [facility], [address], [place], [place] [address]   Basic metabolic panel with GFR     Status: Abnormal   Collection Time: 11/22/23 12:10 PM  Result Value Ref Range   Sodium 140 135 - 145 mmol/L   Potassium 4.2 3.5 - 5.1 mmol/L   Chloride 94 (L) 98 - 111 mmol/L   CO2 33 (H) 22 - 32 mmol/L   Glucose, Bld 129 (H) 70 - 99 mg/dL    Comment: Glucose reference range applies only to samples taken after fasting for at least 8 hours.   BUN 17 8 - 23 mg/dL   Creatinine, Ser 9.01 0.44 - 1.00 mg/dL   Calcium  9.3 8.9 - 10.3 mg/dL   GFR, Estimated >39 >39 mL/min    Comment: (NOTE) Calculated using the CKD-EPI Creatinine Equation (2021)    Anion gap 13 5 - 15    Comment: Performed at [facility], [address] [place], [place] [address]  B Nat Peptide     Status: None   Collection Time: 11/22/23 12:10 PM  Result Value Ref Range   B Natriuretic Peptide 13.6 0.0 - 100.0 pg/mL    Comment: Performed at [facility], [address], [place], [place] [address]  T4, free     Status: None   Collection Time: 11/22/23 12:10 PM  Result Value Ref Range   Free T4 0.78 0.61 - 1.12 ng/dL    Comment: (NOTE) Biotin ingestion may interfere with free T4 tests. If the results are inconsistent with the TSH level, previous test results, or the clinical presentation, then consider biotin interference. If needed, order repeat testing after stopping biotin. Performed at [facility], [address], [place], [place] [address]   Basic metabolic panel     Status: Abnormal   Collection Time: 11/29/23  3:47 PM  Result Value Ref Range   Sodium 147 (H) 135 - 145 mmol/L   Potassium 3.9 3.5 - 5.1 mmol/L    Chloride 104 98 - 111 mmol/L   CO2 29 22 - 32 mmol/L   Glucose, Bld 132 (H) 70 - 99 mg/dL    Comment: Glucose reference range applies only to samples taken after fasting for at least 8 hours.   BUN 16 8 - 23 mg/dL   Creatinine, Ser 8.93 (H) 0.44 - 1.00 mg/dL   Calcium  8.9 8.9 - 10.3 mg/dL   GFR, Estimated 59 (L) >60 mL/min    Comment: (NOTE) Calculated using the CKD-EPI Creatinine Equation (2021)    Anion gap 14 5 - 15    Comment: Performed at [facility], [address] [place], [place] [address]  CBC     Status: Abnormal   Collection Time: 11/29/23  3:47 PM  Result Value Ref Range  WBC 12.4 (H) 4.0 - 10.5 K/uL   RBC 3.63 (L) 3.87 - 5.11 MIL/uL   Hemoglobin 11.0 (L) 12.0 - 15.0 g/dL   HCT 64.7 (L) 63.9 - 53.9 %   MCV 97.0 80.0 - 100.0 fL   MCH 30.3 26.0 - 34.0 pg   MCHC 31.3 30.0 - 36.0 g/dL   RDW 86.3 88.4 - 84.4 %   Platelets 287 150 - 400 K/uL   nRBC 0.0 0.0 - 0.2 %    Comment: Performed at [facility], [address] [place], [place] [address]  Troponin I (High Sensitivity)     Status: None   Collection Time: 11/29/23  3:47 PM  Result Value Ref Range   Troponin I (High Sensitivity) 5 <18 ng/L    Comment: (NOTE) Elevated high sensitivity troponin I (hsTnI) values and significant  changes across serial measurements may suggest ACS but many other  chronic and acute conditions are known to elevate hsTnI results.  Refer to the Links section for chest pain algorithms and additional  guidance. Performed at [facility], [address] [place], [place] [address]   Brain natriuretic peptide (order if patient c/o SOB ONLY)     Status: None   Collection Time: 11/29/23  4:18 PM  Result Value Ref Range   B Natriuretic Peptide 15.8 0.0 - 100.0 pg/mL    Comment: Performed at [facility], [address], [place], [place] [address]  D-dimer, quantitative     Status: None   Collection Time: 11/29/23  5:12 PM  Result Value Ref Range   D-Dimer, Quant  <0.27 0.00 - 0.50 ug/mL-FEU    Comment: (NOTE) At the manufacturer cut-off value of 0.5 g/mL FEU, this assay has a negative predictive value of 95-100%.This assay is intended for use in conjunction with a clinical pretest probability (PTP) assessment model to exclude pulmonary embolism (PE) and deep venous thrombosis (DVT) in outpatients suspected of PE or DVT. Results should be correlated with clinical presentation. Performed at [facility], [address], [place], [place] [address]   Troponin I (High Sensitivity)     Status: None   Collection Time: 11/29/23  5:47 PM  Result Value Ref Range   Troponin I (High Sensitivity) 6 <18 ng/L    Comment: (NOTE) Elevated high sensitivity troponin I (hsTnI) values and significant  changes across serial measurements may suggest ACS but many other  chronic and acute conditions are known to elevate hsTnI results.  Refer to the Links section for chest pain algorithms and additional  guidance. Performed at [facility], [address] [place], [place] [address]         Beverley Adine Hummer, MD, MS

## 2023-12-05 NOTE — Addendum Note (Signed)
 Addended by: EUGENIE ULANDA CROME on: 12/05/2023 12:20 PM   Modules accepted: Orders

## 2023-12-05 NOTE — Patient Instructions (Addendum)
 VISIT SUMMARY: Today, you came in for a follow-up visit to manage your diabetes, hypertension, hyperlipidemia, and chronic heart failure. We discussed your current medications, ordered diabetic shoes, and addressed your concerns about stress and anxiety. Your A1c level is well-controlled, and we reviewed your treatment plan for various conditions including COPD, coronary artery disease, and hypothyroidism. We also discussed the need for vaccinations and routine screenings.  YOUR PLAN: -TYPE 2 DIABETES MELLITUS WITH DIABETIC NEUROPATHY: Your diabetes is well-controlled with an A1c of 6.8%. We will continue your current medication, Farxiga  10 mg daily, and have ordered diabetic shoes to help manage your foot pain.  -HYPERTENSION: Your blood pressure is at goal. We will continue your current medication, losartan  25 mg daily, which also helps protect your kidneys.  -HYPERLIPIDEMIA: Your cholesterol and triglyceride levels are managed with fenofibrate  and rosuvastatin . We will continue these medications and have ordered a cholesterol panel to monitor your levels.  -MORBID OBESITY WITH OBESITY HYPOVENTILATION SYNDROME: Your BMI is 52, which classifies as morbid obesity and contributes to your breathing issues. We will continue to monitor and manage this condition.  -CHRONIC DIASTOLIC HEART FAILURE, NYHA CLASS III: You have chronic heart failure, which affects your heart's ability to pump blood efficiently. We will continue your medications: spironolactone  25 mg daily, Farxiga  10 mg daily, and torsemide  40 mg twice daily.  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 We will continue your current inhalers and nebulizer treatments and refer you to a pulmonologist for further management.  -OSTEOARTHRITIS: You have chronic osteoarthritis, which causes joint pain and stiffness. We will continue to  monitor and manage this condition.  -CORONARY ARTERY DISEASE WITH STABLE ANGINA: You have coronary artery disease, which can cause chest pain. We will continue your current medications: aspirin  81 mg daily and nitroglycerin  as needed for chest pain.  -HYPOTHYROIDISM WITH MULTINODULAR GOITER AND HISTORY OF THYROID  CANCER: You have hypothyroidism, which means your thyroid  gland is underactive. We will continue your current medication, levothyroxine  125 mcg daily, and refer you to an endocrinologist for further management.  -OBSTRUCTIVE SLEEP APNEA: You have obstructive sleep apnea, which affects your breathing during sleep. We will refer you to a sleep specialist for further management.  -SEASONAL ALLERGIC RHINITIS: You have seasonal allergies, which cause symptoms like a runny nose and sneezing. We will continue your current medication, levocetirizine 5 mg at bedtime.  -VITAMIN D  DEFICIENCY: You have a vitamin D  deficiency. We will continue your current treatment, ergocalciferol  50,000 units weekly.  -GASTROESOPHAGEAL REFLUX DISEASE  (GERD): You have GERD, which causes acid reflux and heartburn. We will continue your current medication, omeprazole  40 mg daily.  -IRON DEFICIENCY ANEMIA: You have iron deficiency anemia, which means you have low levels of iron in your blood. We will continue your current treatment, ferrous sulfate  25 mg daily.  -ANXIETY AND MAJOR DEPRESSIVE DISORDER: You have anxiety and depression, which are being managed with medications. We will increase your buspirone  to 30 mg twice daily and trazodone  to 300 mg at night. We will also coordinate with a Child psychotherapist for additional support and connect you with a pharmacist for medication management.  -CHRONIC FOOT PAIN: You have chronic foot pain related to diabetic neuropathy. You will continue to follow up with orthopedics for this issue.  -PULMONARY HYPERTENSION: You have pulmonary hypertension, which is high blood pressure in  the lungs. This condition is associated with your chronic respiratory issues  and will continue to be monitored.  -GENERAL HEALTH MAINTENANCE: We will order a hepatitis C screening and administer the shingles and pneumonia vaccines to keep you up to date with your vaccinations.  INSTRUCTIONS: Please schedule a follow-up appointment in 2 weeks to assess your response to the medication changes and address your anxiety. Ensure you connect with the pulmonologist and endocrinologist for ongoing management of your conditions.  For urgent psychiatric assistance you can go to   [facility] Phone: [phone]  [address] [place], [place] [address]  Hours: Open 24/7, No appointment required.  [facility]  [address] [place], [place] [address] Phone: [phone]  When you're going through tough times, it's easy to feel lonely and overwhelmed. Remember that YOU ARE NOT ALONE and we at [facility] want to support you during this difficult time.  There are many ways to get help and support when you are ready.  You can call the following help lines: - National suicide hotline (614-147-1237) - National Hopeline Network (1-800-SUICIDE)   You can schedule an appointment with a team member with your primary care doc or one of our counselors.  We are all here to support you.  A doctor is on call 24/7   There are many treatments that can help people during difficult times, and we can talk with you about medications, counseling, diet, and other options. We want to offer you HOPE that it won't always feel this bad.   If you are seriously thinking about hurting yourself or have a plan, please call 911 or go to any emergency room right away for immediate help.

## 2023-12-05 NOTE — Telephone Encounter (Signed)
 DME order was printed and given to patient while in clinic for OV on 12/05/2023. Patient verbalized understanding

## 2023-12-06 ENCOUNTER — Other Ambulatory Visit: Payer: Self-pay | Admitting: Family

## 2023-12-06 LAB — HEPATITIS C ANTIBODY: Hepatitis C Ab: NONREACTIVE

## 2023-12-07 ENCOUNTER — Other Ambulatory Visit: Payer: Self-pay | Admitting: Family

## 2023-12-07 ENCOUNTER — Other Ambulatory Visit: Payer: Self-pay | Admitting: Family Medicine

## 2023-12-07 ENCOUNTER — Ambulatory Visit: Payer: Self-pay | Admitting: Family Medicine

## 2023-12-07 DIAGNOSIS — E1129 Type 2 diabetes mellitus with other diabetic kidney complication: Secondary | ICD-10-CM | POA: Insufficient documentation

## 2023-12-07 DIAGNOSIS — J449 Chronic obstructive pulmonary disease, unspecified: Secondary | ICD-10-CM

## 2023-12-12 ENCOUNTER — Emergency Department (HOSPITAL_COMMUNITY)

## 2023-12-12 ENCOUNTER — Emergency Department (HOSPITAL_COMMUNITY): Admission: EM | Admit: 2023-12-12 | Discharge: 2023-12-13 | Disposition: A | Discharge status: Home / Self Care

## 2023-12-12 ENCOUNTER — Telehealth: Payer: Self-pay | Admitting: Family Medicine

## 2023-12-12 DIAGNOSIS — J45909 Unspecified asthma, uncomplicated: Secondary | ICD-10-CM | POA: Insufficient documentation

## 2023-12-12 DIAGNOSIS — M542 Cervicalgia: Secondary | ICD-10-CM | POA: Insufficient documentation

## 2023-12-12 DIAGNOSIS — Z85850 Personal history of malignant neoplasm of thyroid: Secondary | ICD-10-CM | POA: Diagnosis not present

## 2023-12-12 DIAGNOSIS — W19XXXA Unspecified fall, initial encounter: Secondary | ICD-10-CM

## 2023-12-12 DIAGNOSIS — I5032 Chronic diastolic (congestive) heart failure: Secondary | ICD-10-CM | POA: Insufficient documentation

## 2023-12-12 DIAGNOSIS — J449 Chronic obstructive pulmonary disease, unspecified: Secondary | ICD-10-CM | POA: Diagnosis not present

## 2023-12-12 DIAGNOSIS — I110 Hypertensive heart disease with heart failure: Secondary | ICD-10-CM | POA: Insufficient documentation

## 2023-12-12 DIAGNOSIS — M25551 Pain in right hip: Secondary | ICD-10-CM | POA: Insufficient documentation

## 2023-12-12 DIAGNOSIS — Z7982 Long term (current) use of aspirin: Secondary | ICD-10-CM | POA: Diagnosis not present

## 2023-12-12 DIAGNOSIS — M25511 Pain in right shoulder: Secondary | ICD-10-CM | POA: Insufficient documentation

## 2023-12-12 DIAGNOSIS — Z7951 Long term (current) use of inhaled steroids: Secondary | ICD-10-CM | POA: Diagnosis not present

## 2023-12-12 DIAGNOSIS — M545 Low back pain, unspecified: Secondary | ICD-10-CM | POA: Diagnosis not present

## 2023-12-12 DIAGNOSIS — W07XXXA Fall from chair, initial encounter: Secondary | ICD-10-CM | POA: Insufficient documentation

## 2023-12-12 MED ORDER — ACETAMINOPHEN 500 MG PO TABS
1000.0000 mg | ORAL_TABLET | Freq: Once | ORAL | Status: AC
  Administered 2023-12-12: 1000 mg via ORAL
  Filled 2023-12-12: qty 2

## 2023-12-12 NOTE — Telephone Encounter (Signed)
 Copied from CRM #[id number]. Topic: Referral - Question >> Dec 12, 2023  8:45 AM Martinique E wrote: Reason for CRM: Morrison from [facility] called stating that Dr. Nichole with endocrinology is not accepting new patients. Margee questioning if this referral could be sent elsewhere. Callback number for Morrison, if needed, is [phone].

## 2023-12-12 NOTE — ED Provider Notes (Incomplete)
  EMERGENCY DEPARTMENT AT [facility] Provider Note   CSN: [id number] Arrival date & time: 12/12/23  2209     Patient presents with: Michele Owens Michele Owens is a 63 y.o. female with history of thyroid  cancer, anxiety, arthritis, asthma, chronic diastolic CHF, COPD, GERD, depression, gout and headaches, hypertension, chronic pain.  Patient presents to ED for evaluation of fall.  States that she fell over a chair at home after returning from the grocery store.  States that she had stacked some items onto a chair and when she turned around she forgot this chair was in her path and she tripped over it.  She denies hitting her head, taking blood thinners.  She reports that she fell onto her right side.  She is reporting right hip pain, right upper shoulder pain, right neck pain, right low back pain.  She denies any bilateral continence, groin numbness or distal weakness.  She denies lightheadedness, dizziness, weakness, shortness of breath, chest pain.  She arrives on home oxygen  which she states she chronically wears.  She denies any shortness of breath.  Denies medications prior to arrival.   Fall       Prior to Admission medications   Medication Sig Start Date End Date Taking? Authorizing Provider  ACETAMINOPHEN  PO Take 1,300-2,050 mg by mouth daily as needed for moderate pain (pain score 4-6) or headache.    [provider]  albuterol  (VENTOLIN  HFA) 108 (90 Base) MCG/ACT inhaler Inhale 1-2 puffs into the lungs every 6 (six) hours as needed for wheezing or shortness of breath. 11/24/22   Rolan Ezra RAMAN, MD  allopurinol  (ZYLOPRIM ) 100 MG tablet Take 1 tablet (100 mg total) by mouth daily. 11/13/23 11/07/24  [name], MD  aspirin  EC 81 MG tablet Take 1 tablet (81 mg total) by mouth daily. Swallow whole. 11/13/23 11/07/24  [name], MD  aspirin  EC 81 MG tablet Take 81 mg by mouth daily. Swallow whole.    [provider]  blood glucose meter kit  and supplies KIT Dispense based on patient and insurance preference. Use up to four times daily as directed. 12/25/20   Lue Elsie BROCKS, MD  Blood Glucose Monitoring Suppl DEVI 1 each by Does not apply route in the morning, at noon, and at bedtime. May substitute to any manufacturer covered by patient's insurance. 11/13/23   [name], MD  budesonide  (PULMICORT ) 0.25 MG/2ML nebulizer solution Inhale 2 mLs (0.25 mg total) by nebulization 2 (two) times daily. 11/13/23   [name], MD  busPIRone  (BUSPAR ) 30 MG tablet Take 1 tablet (30 mg total) by mouth 2 (two) times daily. 12/05/23 11/29/24  [name], MD  butalbital -acetaminophen -caffeine  (FIORICET ) 50-325-40 MG tablet Take 1-2 tablets by mouth every 6 (six) hours as needed for headache. 11/07/23 11/06/24  Webb, Padonda B, FNP  calcitRIOL  (ROCALTROL ) 0.5 MCG capsule Take 1 capsule (0.5 mcg total) by mouth daily. Patient not taking: Reported on 12/05/2023 09/26/16   Hongalgi, Anand D, MD  carvedilol  (COREG ) 6.25 MG tablet Take 1 tablet (6.25 mg total) by mouth 2 (two) times daily. 11/06/23   Webb, Padonda B, FNP  clotrimazole  (LOTRIMIN ) 1 % cream Apply topically 2 (two) times daily. Patient not taking: Reported on 12/05/2023 11/06/23   [name], MD  colchicine  0.6 MG tablet TAKE 1 TABLET (0.6 MG TOTAL) BY MOUTH 2 (TWO) TIMES DAILY FOR 5 DAYS. 11/12/23 12/05/23  [name], MD  dicyclomine  (BENTYL ) 20  MG tablet TAKE 1 TABLET (20 MG TOTAL) BY MOUTH 2 (TWO) TIMES DAILY. 11/29/23   [name], MD  EPINEPHrine  0.3 mg/0.3 mL IJ SOAJ injection Inject 0.3 mg into the muscle once as needed for anaphylaxis.    [provider]  FARXIGA  10 MG TABS tablet Take 1 tablet (10 mg total) by mouth daily before breakfast. 11/06/23   [name], MD  fenofibrate  (TRICOR ) 145 MG tablet Take 1 tablet (145 mg total) by mouth daily. 02/02/23   Rolan Ezra RAMAN, MD  FEROSUL 325 (65 Fe) MG tablet Take 1 tablet (325 mg total) by  mouth daily. 11/07/23   Webb, Padonda B, FNP  Glucose Blood (BLOOD GLUCOSE TEST STRIPS) STRP 1 each by In Vitro route in the morning, at noon, and at bedtime. May substitute to any manufacturer covered by patient's insurance. 11/13/23 12/13/23  [name], MD  Lancet Device MISC 1 each by Does not apply route in the morning, at noon, and at bedtime. May substitute to any manufacturer covered by patient's insurance. 11/13/23 12/13/23  [name], MD  Lancets Misc. MISC 1 each by Does not apply route in the morning, at noon, and at bedtime. May substitute to any manufacturer covered by patient's insurance. 11/13/23 12/13/23  [name], MD  levocetirizine (XYZAL ) 5 MG tablet Take 1 tablet (5 mg total) by mouth at bedtime. 11/16/23   [name], MD  levothyroxine  (SYNTHROID ) 125 MCG tablet Take 1 tablet (125 mcg total) by mouth at bedtime. 11/06/23   [name], MD  losartan  (COZAAR ) 25 MG tablet Take 1 tablet (25 mg total) by mouth daily. 11/13/23   [name], MD  nitroGLYCERIN  (NITROSTAT ) 0.4 MG SL tablet Place 1 tablet (0.4 mg total) under the tongue every 5 (five) minutes as needed for chest pain. Patient not taking: Reported on 12/05/2023 05/23/23   Glena Harlene HERO, FNP  nystatin  cream (MYCOSTATIN ) Apply 1 Application topically 2 (two) times daily as needed for dry skin. Patient not taking: Reported on 12/05/2023 09/26/22   [provider]  omeprazole  (PRILOSEC) 20 MG capsule Take 2 capsules (40 mg total) by mouth daily. 11/06/23   [name], MD  ondansetron  (ZOFRAN -ODT) 4 MG disintegrating tablet Take 1 tablet (4 mg total) by mouth every 8 (eight) hours as needed. Patient not taking: Reported on 12/05/2023 08/29/23   Henderly, Britni A, PA-C  OXYGEN  Inhale 2 L into the lungs continuous. Patient not taking: Reported on 12/05/2023    [provider]  potassium chloride  (KLOR-CON ) 10 MEQ tablet Take 1 tablet (10 mEq total) by mouth 2 (two) times daily.  11/13/23   [name], MD  rosuvastatin  (CRESTOR ) 10 MG tablet Take 1 tablet (10 mg total) by mouth every evening. 11/13/23   [name], MD  senna-docusate (SENOKOT-S) 8.6-50 MG tablet Take 1 tablet by mouth daily. Patient not taking: Reported on 12/05/2023 02/07/23   Lendia Boby CROME, NP-C  spironolactone  (ALDACTONE ) 25 MG tablet Take 1 tablet (25 mg total) by mouth daily. 11/13/23   [name], MD  torsemide  (DEMADEX ) 20 MG tablet Take 2 tablets (40 mg total) by mouth 2 (two) times daily. Patient not taking: Reported on 12/05/2023 11/13/23   [name], MD  traZODone  (DESYREL ) 300 MG tablet Take 1 tablet (300 mg total) by mouth at bedtime. 12/05/23 11/29/24  [name], MD  umeclidinium-vilanterol (ANORO ELLIPTA ) 62.5-25 MCG/ACT AEPB Inhale 1 puff into the lungs  daily. 11/06/23   Douglass Caul B, FNP  Vitamin D , Ergocalciferol , (DRISDOL ) 1.25 MG (50000 UNIT) CAPS capsule Take 1 capsule (50,000 Units total) by mouth every 7 (seven) days. 11/13/23   [name], MD    Allergies: Bee venom, Fioricet  [butalbital -apap-caffeine ], Ibuprofen , Lamisil  [terbinafine ], and Nsaids    Review of Systems  Updated Vital Signs BP (!) 152/78   Pulse 78   Temp 98.3 F (36.8 C)   Resp 18   SpO2 98%   Physical Exam  (all labs ordered are listed, but only abnormal results are displayed) Labs Reviewed - No data to display  EKG: None  Radiology: No results found.  {Document cardiac monitor, telemetry assessment procedure when appropriate:32947} Procedures   Medications Ordered in the ED  acetaminophen  (TYLENOL ) tablet 1,000 mg (has no administration in time range)      {Click here for ABCD2, HEART and other calculators REFRESH Note before signing:1}                              Medical Decision Making Amount and/or Complexity of Data Reviewed Radiology: ordered. ECG/medicine tests: ordered.  Risk OTC drugs.   ***  {Document critical care time when  appropriate  Document review of labs and clinical decision tools ie CHADS2VASC2, etc  Document your independent review of radiology images and any outside records  Document your discussion with family members, caretakers and with consultants  Document social determinants of health affecting pt's care  Document your decision making why or why not admission, treatments were needed:32947:::1}   Final diagnoses:  None    ED Discharge Orders     None

## 2023-12-12 NOTE — ED Provider Notes (Signed)
 [place] [facility] Provider Note   CSN: [id number] Arrival date & time: 12/12/23  2209     Patient presents with: Michele Owens Michele Owens is a 63 y.o. female with history of thyroid  cancer, anxiety, arthritis, asthma, chronic diastolic CHF, COPD, GERD, depression, gout and headaches, hypertension, chronic pain.  Patient presents to ED for evaluation of fall.  States that she fell over a chair at home after returning from the grocery store.  States that she had stacked some items onto a chair and when she turned around she forgot this chair was in her path and she tripped over it.  She denies hitting her head, taking blood thinners.  She reports that she fell onto her right side.  She is reporting right hip pain, right upper shoulder pain, right neck pain, right low back pain.  She denies any bilateral continence, groin numbness or distal weakness.  She denies lightheadedness, dizziness, weakness, shortness of breath, chest pain.  She arrives on home oxygen  which she states she chronically wears.  She denies any shortness of breath.  Denies medications prior to arrival.   Fall       Prior to Admission medications   Medication Sig Start Date End Date Taking? Authorizing Provider  ACETAMINOPHEN  PO Take 1,300-2,050 mg by mouth daily as needed for moderate pain (pain score 4-6) or headache.    [provider]  albuterol  (VENTOLIN  HFA) 108 (90 Base) MCG/ACT inhaler Inhale 1-2 puffs into the lungs every 6 (six) hours as needed for wheezing or shortness of breath. 11/24/22   Rolan Ezra RAMAN, MD  allopurinol  (ZYLOPRIM ) 100 MG tablet Take 1 tablet (100 mg total) by mouth daily. 11/13/23 11/07/24  [name], MD  aspirin  EC 81 MG tablet Take 1 tablet (81 mg total) by mouth daily. Swallow whole. 11/13/23 11/07/24  [name], MD  aspirin  EC 81 MG tablet Take 81 mg by mouth daily. Swallow whole.    [provider]  blood glucose meter kit  and supplies KIT Dispense based on patient and insurance preference. Use up to four times daily as directed. 12/25/20   Lue Elsie BROCKS, MD  Blood Glucose Monitoring Suppl DEVI 1 each by Does not apply route in the morning, at noon, and at bedtime. May substitute to any manufacturer covered by patient's insurance. 11/13/23   [name], MD  budesonide  (PULMICORT ) 0.25 MG/2ML nebulizer solution Inhale 2 mLs (0.25 mg total) by nebulization 2 (two) times daily. 11/13/23   [name], MD  busPIRone  (BUSPAR ) 30 MG tablet Take 1 tablet (30 mg total) by mouth 2 (two) times daily. 12/05/23 11/29/24  [name], MD  butalbital -acetaminophen -caffeine  (FIORICET ) 50-325-40 MG tablet Take 1-2 tablets by mouth every 6 (six) hours as needed for headache. 11/07/23 11/06/24  Webb, Padonda B, FNP  calcitRIOL  (ROCALTROL ) 0.5 MCG capsule Take 1 capsule (0.5 mcg total) by mouth daily. Patient not taking: Reported on 12/05/2023 09/26/16   Hongalgi, Anand D, MD  carvedilol  (COREG ) 6.25 MG tablet Take 1 tablet (6.25 mg total) by mouth 2 (two) times daily. 11/06/23   Webb, Padonda B, FNP  clotrimazole  (LOTRIMIN ) 1 % cream Apply topically 2 (two) times daily. Patient not taking: Reported on 12/05/2023 11/06/23   [name], MD  colchicine  0.6 MG tablet TAKE 1 TABLET (0.6 MG TOTAL) BY MOUTH 2 (TWO) TIMES DAILY FOR 5 DAYS. 11/12/23 12/05/23  [name], MD  dicyclomine  (BENTYL ) 20  MG tablet TAKE 1 TABLET (20 MG TOTAL) BY MOUTH 2 (TWO) TIMES DAILY. 11/29/23   [name], MD  EPINEPHrine  0.3 mg/0.3 mL IJ SOAJ injection Inject 0.3 mg into the muscle once as needed for anaphylaxis.    [provider]  FARXIGA  10 MG TABS tablet Take 1 tablet (10 mg total) by mouth daily before breakfast. 11/06/23   [name], MD  fenofibrate  (TRICOR ) 145 MG tablet Take 1 tablet (145 mg total) by mouth daily. 02/02/23   Rolan Ezra RAMAN, MD  FEROSUL 325 (65 Fe) MG tablet Take 1 tablet (325 mg total) by  mouth daily. 11/07/23   Webb, Padonda B, FNP  Glucose Blood (BLOOD GLUCOSE TEST STRIPS) STRP 1 each by In Vitro route in the morning, at noon, and at bedtime. May substitute to any manufacturer covered by patient's insurance. 11/13/23 12/13/23  [name], MD  Lancet Device MISC 1 each by Does not apply route in the morning, at noon, and at bedtime. May substitute to any manufacturer covered by patient's insurance. 11/13/23 12/13/23  [name], MD  Lancets Misc. MISC 1 each by Does not apply route in the morning, at noon, and at bedtime. May substitute to any manufacturer covered by patient's insurance. 11/13/23 12/13/23  [name], MD  levocetirizine (XYZAL ) 5 MG tablet Take 1 tablet (5 mg total) by mouth at bedtime. 11/16/23   [name], MD  levothyroxine  (SYNTHROID ) 125 MCG tablet Take 1 tablet (125 mcg total) by mouth at bedtime. 11/06/23   [name], MD  losartan  (COZAAR ) 25 MG tablet Take 1 tablet (25 mg total) by mouth daily. 11/13/23   [name], MD  nitroGLYCERIN  (NITROSTAT ) 0.4 MG SL tablet Place 1 tablet (0.4 mg total) under the tongue every 5 (five) minutes as needed for chest pain. Patient not taking: Reported on 12/05/2023 05/23/23   Glena Harlene HERO, FNP  nystatin  cream (MYCOSTATIN ) Apply 1 Application topically 2 (two) times daily as needed for dry skin. Patient not taking: Reported on 12/05/2023 09/26/22   [provider]  omeprazole  (PRILOSEC) 20 MG capsule Take 2 capsules (40 mg total) by mouth daily. 11/06/23   [name], MD  ondansetron  (ZOFRAN -ODT) 4 MG disintegrating tablet Take 1 tablet (4 mg total) by mouth every 8 (eight) hours as needed. Patient not taking: Reported on 12/05/2023 08/29/23   Henderly, Britni A, PA-C  OXYGEN  Inhale 2 L into the lungs continuous. Patient not taking: Reported on 12/05/2023    [provider]  potassium chloride  (KLOR-CON ) 10 MEQ tablet Take 1 tablet (10 mEq total) by mouth 2 (two) times daily.  11/13/23   [name], MD  rosuvastatin  (CRESTOR ) 10 MG tablet Take 1 tablet (10 mg total) by mouth every evening. 11/13/23   [name], MD  senna-docusate (SENOKOT-S) 8.6-50 MG tablet Take 1 tablet by mouth daily. Patient not taking: Reported on 12/05/2023 02/07/23   Lendia Boby CROME, NP-C  spironolactone  (ALDACTONE ) 25 MG tablet Take 1 tablet (25 mg total) by mouth daily. 11/13/23   [name], MD  torsemide  (DEMADEX ) 20 MG tablet Take 2 tablets (40 mg total) by mouth 2 (two) times daily. Patient not taking: Reported on 12/05/2023 11/13/23   [name], MD  traZODone  (DESYREL ) 300 MG tablet Take 1 tablet (300 mg total) by mouth at bedtime. 12/05/23 11/29/24  [name], MD  umeclidinium-vilanterol (ANORO ELLIPTA ) 62.5-25 MCG/ACT AEPB Inhale 1 puff into the lungs  daily. 11/06/23   Douglass Caul B, FNP  Vitamin D , Ergocalciferol , (DRISDOL ) 1.25 MG (50000 UNIT) CAPS capsule Take 1 capsule (50,000 Units total) by mouth every 7 (seven) days. 11/13/23   [name], MD    Allergies: Bee venom, Fioricet  [butalbital -apap-caffeine ], Ibuprofen , Lamisil  [terbinafine ], and Nsaids    Review of Systems  Musculoskeletal:  Positive for arthralgias and myalgias.  All other systems reviewed and are negative.   Updated Vital Signs BP (!) 152/78   Pulse 78   Temp 98.3 F (36.8 C)   Resp 18   SpO2 98%   Physical Exam Vitals and nursing note reviewed.  Constitutional:      General: She is not in acute distress.    Appearance: She is well-developed.  HENT:     Head: Normocephalic and atraumatic.  Eyes:     Conjunctiva/sclera: Conjunctivae normal.  Cardiovascular:     Rate and Rhythm: Normal rate and regular rhythm.     Heart sounds: No murmur heard. Pulmonary:     Effort: Pulmonary effort is normal. No respiratory distress.     Breath sounds: Normal breath sounds.  Abdominal:     Palpations: Abdomen is soft.     Tenderness: There is no abdominal tenderness.   Musculoskeletal:        General: No swelling.     Cervical back: Neck supple.     Right lower leg: No edema.     Left lower leg: No edema.     Comments: Full ROM bilateral wrists, bilateral elbows, bilateral shoulders.  No deformity.  Full ROM bilateral ankles, bilateral knees, bilateral hips.  No shortening or rotation of bilateral lower extremities.  No deformity to bilateral lower extremities.  There is nonfocal tenderness to right hip laterally.  Nonfocal tenderness to right shoulder laterally.  Nonfocal tenderness to right side paracervical spine.  No central or cervical spine tenderness.  Skin:    General: Skin is warm and dry.     Capillary Refill: Capillary refill takes less than 2 seconds.  Neurological:     Mental Status: She is alert and oriented to person, place, and time. Mental status is at baseline.     Comments: Reassuring neurological examination without focal neurodeficits  Psychiatric:        Mood and Affect: Mood normal.     (all labs ordered are listed, but only abnormal results are displayed) Labs Reviewed - No data to display  EKG: None  Radiology: DG Cervical Spine 2-3 Views Result Date: 12/12/2023 CLINICAL DATA:  Pain after fall onto wood floor. EXAM: CERVICAL SPINE - 2-3 VIEW COMPARISON:  04/17/2017 FINDINGS: The skull base through C5 are visualized on the lateral view. C6, C7, and the cervicothoracic junction are obscured and not assessed. Alignment where visualized is normal. No evidence of acute fracture. Visualized disc spaces are preserved. The dens is not well assessed on provided views. Soft tissue attenuation from habitus further limits assessment. IMPRESSION: Limited exam, cannot assess C6 through the cervicothoracic junction. No evidence of acute fracture of the visualized cervical segments. Electronically Signed   By: Andrea Gasman M.D.   On: 12/12/2023 23:59   DG Hip Unilat W or Wo Pelvis 2-3 Views Right Result Date: 12/12/2023 CLINICAL DATA:   Pain after fall onto the wood floor. EXAM: DG HIP (WITH OR WITHOUT PELVIS) 2-3V RIGHT COMPARISON:  None Available. FINDINGS: No evidence of acute fracture of the pelvis or right hip. No hip dislocation. Pubic rami are intact. Hip joint spaces  preserved. No pubic symphyseal or sacroiliac diastasis. IMPRESSION: No fracture or dislocation of the pelvis or right hip. Electronically Signed   By: Andrea Gasman M.D.   On: 12/12/2023 23:57   DG Lumbar Spine Complete Result Date: 12/12/2023 CLINICAL DATA:  Pain after fall onto wood floor. EXAM: LUMBAR SPINE - COMPLETE 4+ VIEW COMPARISON:  10/19/2023 FINDINGS: Five non-rib-bearing lumbar vertebra. No evidence of acute fracture. Posterior elements are not well assessed due to soft tissue attenuation from habitus. No traumatic subluxation. Vertebral body heights are normal. Disc space narrowing and spurring throughout most prominent at L2-L3. Lower lumbar facet hypertrophy. No sacroiliac diastasis. IMPRESSION: 1. No acute fracture or subluxation of the lumbar spine. 2. Multilevel degenerative disc disease and facet hypertrophy. Electronically Signed   By: Andrea Gasman M.D.   On: 12/12/2023 23:57   DG Shoulder Right Result Date: 12/12/2023 CLINICAL DATA:  Status post fall onto wood floor. EXAM: RIGHT SHOULDER - 2+ VIEW COMPARISON:  05/13/2023 FINDINGS: Technically limited due to difficulty with positioning and habitus. Allowing for this, no fracture or dislocation. Mild acromioclavicular degenerative spurring. Probable degenerative subchondral cyst in the glenoid. No soft tissue calcifications. IMPRESSION: 1. No fracture or dislocation of the right shoulder. 2. Acromioclavicular and glenohumeral degenerative change. Electronically Signed   By: Andrea Gasman M.D.   On: 12/12/2023 23:56    Procedures   Medications Ordered in the ED  acetaminophen  (TYLENOL ) tablet 1,000 mg (1,000 mg Oral Given 12/12/23 2353)    Medical Decision Making Amount and/or Complexity  of Data Reviewed Radiology: ordered. ECG/medicine tests: ordered.  Risk OTC drugs.   This is a 63 year old female presents to the ED after suffering ground-level fall at home.  On exam, she is hemodynamically stable.  She is afebrile and nontachycardic.  Lung sounds are clear bilaterally, she is not hypoxic.  Her abdomen is soft and compressible.  Her neurological examination is at baseline.  She has some right-sided paracervical spine tenderness.  No central or cervical spine tenderness.  Also has some vague tenderness to right shoulder, right hip.  No deformity, no reduced range of motion's.  She has no shortening or rotation of right lower extremity.  She is overall nontoxic in appearance.  She is on her chronic 2 L of oxygen  via nasal cannula which she always uses.  Here, the patient was given Tylenol  for pain.  X-ray of cervical spine, right shoulder, right hip, lumbar film were collected.  All imaging negative for acute process.  Patient reports reduction of pain with Tylenol .  At this time patient will be discharged home.  Advised to follow-up with PCP.  Advised of reassuring workup.  Patient voices understanding.  She had all of her questions answered to her satisfaction.  She is stable to discharge home at this time.    Final diagnoses:  Fall, initial encounter    ED Discharge Orders     None          Michele Owens Michele Owens 12/13/23 0129    Griselda Norris, MD 12/13/23 [phone]

## 2023-12-12 NOTE — Telephone Encounter (Signed)
 Dr Sebastian put this referral in on 12/05/23.

## 2023-12-12 NOTE — ED Triage Notes (Signed)
 PT BIB EMS FROM HOME RESULTING FROM MECHANICAL FALL. PT DENIES LOC. ENDORSES RIGHT HIP AND BACK PAIN

## 2023-12-13 ENCOUNTER — Other Ambulatory Visit: Payer: Self-pay | Admitting: Family Medicine

## 2023-12-13 ENCOUNTER — Encounter (HOSPITAL_COMMUNITY): Payer: Self-pay

## 2023-12-13 ENCOUNTER — Other Ambulatory Visit: Payer: Self-pay

## 2023-12-13 DIAGNOSIS — J449 Chronic obstructive pulmonary disease, unspecified: Secondary | ICD-10-CM

## 2023-12-13 MED ORDER — CLOTRIMAZOLE 1 % EX CREA
TOPICAL_CREAM | Freq: Two times a day (BID) | CUTANEOUS | 0 refills | Status: AC
Start: 2023-12-13 — End: ?

## 2023-12-13 MED ORDER — UMECLIDINIUM-VILANTEROL 62.5-25 MCG/ACT IN AEPB: 1.0000 | INHALATION_SPRAY | Freq: Every day | RESPIRATORY_TRACT | 0 refills | Status: DC

## 2023-12-13 NOTE — Telephone Encounter (Signed)
 Patient requesting medications not on profile:  suboxone  4-1 mg film 1.5 3x day if needed(90 day supply)

## 2023-12-13 NOTE — ED Notes (Signed)
 Ptar called

## 2023-12-13 NOTE — Discharge Instructions (Signed)
 It was a pleasure taking part in your care.  As discussed, your work appears reassuring.  Please follow-up with your PCP.  Please continue taking all medications as prescribed.  Return to the ED with new symptoms.

## 2023-12-13 NOTE — Telephone Encounter (Signed)
 Copied from CRM [id number]. Topic: Clinical - Medication Refill >> Dec 13, 2023 10:42 AM Suzen RAMAN wrote: Medication: Anoro Ellipta    52.5 mg/25 mg clotrimazole  (LOTRIMIN ) 1 % cream suboxone  4-1 mg film 1.5 3x day if needed(90 day supply)   Has the patient contacted their pharmacy? Yes   This is the patient's preferred pharmacy:  [facility] - [place], [place] - [address] [place] [address] Phone: [phone] Fax: [phone]  Is this the correct pharmacy for this prescription? Yes If no, delete pharmacy and type the correct one.   Has the prescription been filled recently? No  Is the patient out of the medication? Yes  Has the patient been seen for an appointment in the last year OR does the patient have an upcoming appointment? Yes  Can we respond through MyChart? No  Agent: Please be advised that Rx refills may take up to 3 business days. We ask that you follow-up with your pharmacy.

## 2023-12-15 ENCOUNTER — Emergency Department (HOSPITAL_COMMUNITY)
Admission: EM | Admit: 2023-12-15 | Discharge: 2023-12-15 | Disposition: A | Discharge status: Home / Self Care | Attending: Emergency Medicine | Admitting: Emergency Medicine

## 2023-12-15 ENCOUNTER — Emergency Department (HOSPITAL_COMMUNITY)

## 2023-12-15 ENCOUNTER — Encounter (HOSPITAL_COMMUNITY): Payer: Self-pay | Admitting: Emergency Medicine

## 2023-12-15 DIAGNOSIS — M79671 Pain in right foot: Secondary | ICD-10-CM | POA: Insufficient documentation

## 2023-12-15 DIAGNOSIS — J449 Chronic obstructive pulmonary disease, unspecified: Secondary | ICD-10-CM | POA: Insufficient documentation

## 2023-12-15 DIAGNOSIS — I5032 Chronic diastolic (congestive) heart failure: Secondary | ICD-10-CM | POA: Diagnosis not present

## 2023-12-15 DIAGNOSIS — Z85850 Personal history of malignant neoplasm of thyroid: Secondary | ICD-10-CM | POA: Insufficient documentation

## 2023-12-15 DIAGNOSIS — E119 Type 2 diabetes mellitus without complications: Secondary | ICD-10-CM | POA: Diagnosis not present

## 2023-12-15 DIAGNOSIS — I110 Hypertensive heart disease with heart failure: Secondary | ICD-10-CM | POA: Insufficient documentation

## 2023-12-15 DIAGNOSIS — M79672 Pain in left foot: Secondary | ICD-10-CM | POA: Diagnosis present

## 2023-12-15 MED ORDER — DICLOFENAC SODIUM 1 % EX GEL: 4.0000 g | Freq: Four times a day (QID) | CUTANEOUS | Status: DC

## 2023-12-15 MED ORDER — DICLOFENAC SODIUM 1 % EX GEL
4.0000 g | Freq: Four times a day (QID) | CUTANEOUS | 0 refills | Status: AC
Start: 2023-12-15 — End: ?

## 2023-12-15 MED ORDER — ACETAMINOPHEN 325 MG PO TABS
650.0000 mg | ORAL_TABLET | Freq: Once | ORAL | Status: AC
  Administered 2023-12-15: 650 mg via ORAL
  Filled 2023-12-15: qty 2

## 2023-12-15 NOTE — ED Notes (Signed)
  at bedside

## 2023-12-15 NOTE — ED Notes (Signed)
 Ptar called

## 2023-12-15 NOTE — ED Triage Notes (Signed)
 Pt here  from home with c/o bil foot pain right is worse than left , pt was here a few days ago for a fall but we did not look at her feet

## 2023-12-15 NOTE — ED Provider Notes (Signed)
 [place] [facility] Provider Note HPI Michele Owens is a 63 y.o. female with a medical history as below who presents for evaluation of bilateral foot pain.  Patient states that she fell over a chair several days ago and fell onto her right side.  She states that she was evaluated in our emergency department several days ago but that nobody looked at her feet.    Past Medical History:  Diagnosis Date   Anxiety    Arthritis    Asthma    Chronic diastolic CHF (congestive heart failure) (HCC)    COPD (chronic obstructive pulmonary disease) (HCC)    Uses Oxygen  at night   Depression    Diabetes mellitus without complication (HCC)    GERD (gastroesophageal reflux disease)    Gout    Headache    migraines   History of DVT of lower extremity    Hypertension    Morbid obesity with BMI of 45.0-49.9, adult (HCC)    Thyroid  cancer (HCC) 09/23/2016   Past Surgical History:  Procedure Laterality Date   BUNIONECTOMY Bilateral    CARDIAC CATHETERIZATION N/A 06/23/2015   Procedure: Right/Left Heart Cath and Coronary Angiography;  Surgeon: Ezra GORMAN Shuck, MD;  Location: [facility] INVASIVE CV LAB;  Service: Cardiovascular;  Laterality: N/A;   COLONOSCOPY WITH PROPOFOL  N/A 12/15/2015   Procedure: COLONOSCOPY WITH PROPOFOL ;  Surgeon: Norleen Hint, MD;  Location: [facility] ENDOSCOPY;  Service: Endoscopy;  Laterality: N/A;   RIGHT HEART CATH N/A 10/01/2019   Procedure: RIGHT HEART CATH;  Surgeon: Shuck Ezra GORMAN, MD;  Location: [facility] INVASIVE CV LAB;  Service: Cardiovascular;  Laterality: N/A;   THYROIDECTOMY  09/19/2016   THYROIDECTOMY N/A 09/19/2016   Procedure: TOTAL THYROIDECTOMY;  Surgeon: Eletha Boas, MD;  Location: [facility] OR;  Service: General;  Laterality: N/A;   TONSILLECTOMY     TOTAL ABDOMINAL HYSTERECTOMY  07/14/10    Review of Systems Pertinent positives and negative findings are listed as part of the History of Present Illness and MDM  Physical Exam Vitals:   12/15/23  1911  BP: 116/68  Pulse: 76  Resp: 17  Temp: 98.8 F (37.1 C)  TempSrc: Oral  SpO2: 97%     Constitutional Nursing notes reviewed Vital signs reviewed  HEENT No obvious trauma Pupils cross midline Neck supple  Respiratory Effort normal Breathing well on home O2 CTAB  CV Normal rate and rhythm  No pitting edema  Abdomen Soft, non-tender, non-distended No peritonitis  MSK 2+ palpable DP pulses bilaterally No evidence of pedal edema Full strength with ankle dorsiflexion and plantarflexion bilaterally Tenderness to palpation over the dorsum of the foot bilaterally No overlying skin changes or skin breakdown  Neuro Awake and alert Pupils cross midline Moving all extremities    MDM:  Initial Differential Diagnoses includes fracture, dislocation, soft tissue injury, peripheral artery disease/occlusion, soft tissue infection, edema  I reviewed the patient's vitals, the nursing triage note and evaluated the patient at bedside.   I reviewed the patient's external records which show that the patient was seen here for a fall several days ago where she got a trauma workup.  She is presenting today for bilateral foot pain.  She states that she is still able to ambulate at baseline.  Bilateral foot exam reassuring as detailed above.  2+ palpable DP pulses bilaterally with no skin changes.  I have very low suspicion for PAD/arterial occlusion, soft tissue injury or soft tissue infection based on physical exam.  No  pitting edema to the legs.  X-rays ordered and reviewed by myself which show no fracture or dislocation.  Patient given Tylenol  and discharged home  Procedures: Procedures  Medications administered in the ED: Medications  acetaminophen  (TYLENOL ) tablet 650 mg (650 mg Oral Given 12/15/23 1949)     Impression: 1. Foot pain, left   2. Foot pain, right      Patient's presentation is most consistent with acute complicated illness / injury requiring diagnostic  workup.  Disposition: ED Disposition:  Discharge   Discharge: Patient is felt to be medically appropriate for discharge at this time. Patient was instructed to follow up with their primary care doctor/specialists listed above for re-evaluation. Patient was given strict return precautions.  ED Discharge Orders          Ordered    diclofenac  Sodium (VOLTAREN ) 1 % GEL  4 times daily,   Status:  Discontinued        12/15/23 2035    diclofenac  Sodium (VOLTAREN ) 1 % GEL  4 times daily        12/15/23 2037                  Dionisio Blunt, MD 12/15/23 7752    Freddi Hamilton, MD 12/16/23 2259

## 2023-12-17 ENCOUNTER — Telehealth: Payer: Self-pay

## 2023-12-17 ENCOUNTER — Other Ambulatory Visit (HOSPITAL_COMMUNITY): Payer: Self-pay

## 2023-12-17 ENCOUNTER — Other Ambulatory Visit: Payer: Self-pay | Admitting: Family Medicine

## 2023-12-17 MED ORDER — FENOFIBRATE 145 MG PO TABS: 145.0000 mg | ORAL_TABLET | Freq: Every day | ORAL | 11 refills | Status: DC

## 2023-12-17 NOTE — Transitions of Care (Post Inpatient/ED Visit) (Signed)
   12/17/2023  Name: Michele Owens MRN: [id number] DOB: [birth year]  Today's TOC FU Call Status: Today's TOC FU Call Status:: Unsuccessful Call (1st Attempt) Unsuccessful Call (1st Attempt) Date: 12/17/23  Attempted to reach the patient regarding the most recent Inpatient/ED visit.  Follow Up Plan: Additional outreach attempts will be made to reach the patient to complete the Transitions of Care (Post Inpatient/ED visit) call.   Signature  Jenee Spaugh D, CMA

## 2023-12-18 ENCOUNTER — Other Ambulatory Visit: Payer: Self-pay | Admitting: Family

## 2023-12-18 NOTE — Telephone Encounter (Unsigned)
 Copied from CRM #[id number]. Topic: Clinical - Medication Refill >> Dec 18, 2023  4:22 PM Sasha M wrote: Medication: butalbital -acetaminophen -caffeine  (FIORICET ) 50-325-40 MG tablet  Has the patient contacted their pharmacy? No (Agent: If no, request that the patient contact the pharmacy for the refill. If patient does not wish to contact the pharmacy document the reason why and proceed with request.) (Agent: If yes, when and what did the pharmacy advise?)  This is the patient's preferred pharmacy:  [facility] - [place], [place] - [address] [place] [address] Phone: [phone] Fax: [phone]  Is this the correct pharmacy for this prescription? Yes If no, delete pharmacy and type the correct one.   Has the prescription been filled recently? No  Is the patient out of the medication? No  Has the patient been seen for an appointment in the last year OR does the patient have an upcoming appointment? Yes  Can we respond through MyChart? No  Agent: Please be advised that Rx refills may take up to 3 business days. We ask that you follow-up with your pharmacy.

## 2023-12-19 NOTE — Telephone Encounter (Signed)
 Noted. OV scheduled for 12/25/2023.

## 2023-12-20 ENCOUNTER — Other Ambulatory Visit: Payer: Self-pay | Admitting: Family Medicine

## 2023-12-20 NOTE — Telephone Encounter (Signed)
 Copied from CRM #[id number]. Topic: Clinical - Medication Refill >> Dec 20, 2023 11:44 AM Chiquita SQUIBB wrote: Medication: butalbital -acetaminophen -caffeine  (FIORICET ) 50-325-40 MG tablet   Pharmacy is requesting the patient be updated on this medication.    Has the patient contacted their pharmacy? Yes- Pharmacy is calling in on behalf of the patient.  (Agent: If no, request that the patient contact the pharmacy for the refill. If patient does not wish to contact the pharmacy document the reason why and proceed with request.) (Agent: If yes, when and what did the pharmacy advise?)  This is the patient's preferred pharmacy:  [facility] - [place], [place] - [address] [place] [address] Phone: [phone] Fax: [phone]  Is this the correct pharmacy for this prescription? Yes If no, delete pharmacy and type the correct one.   Has the prescription been filled recently? No  Is the patient out of the medication? No  Has the patient been seen for an appointment in the last year OR does the patient have an upcoming appointment? Yes  Can we respond through MyChart? No  Agent: Please be advised that Rx refills may take up to 3 business days. We ask that you follow-up with your pharmacy.

## 2023-12-25 ENCOUNTER — Ambulatory Visit (INDEPENDENT_AMBULATORY_CARE_PROVIDER_SITE_OTHER): Admitting: Family Medicine

## 2023-12-25 ENCOUNTER — Encounter: Payer: Self-pay | Admitting: Family Medicine

## 2023-12-25 ENCOUNTER — Ambulatory Visit (INDEPENDENT_AMBULATORY_CARE_PROVIDER_SITE_OTHER)

## 2023-12-25 VITALS — BP 113/58 | HR 82 | Temp 97.2°F | Resp 18 | Wt 318.4 lb

## 2023-12-25 DIAGNOSIS — E1142 Type 2 diabetes mellitus with diabetic polyneuropathy: Secondary | ICD-10-CM | POA: Diagnosis not present

## 2023-12-25 DIAGNOSIS — F3132 Bipolar disorder, current episode depressed, moderate: Secondary | ICD-10-CM

## 2023-12-25 DIAGNOSIS — E662 Morbid (severe) obesity with alveolar hypoventilation: Secondary | ICD-10-CM

## 2023-12-25 DIAGNOSIS — G4733 Obstructive sleep apnea (adult) (pediatric): Secondary | ICD-10-CM

## 2023-12-25 DIAGNOSIS — Z9981 Dependence on supplemental oxygen: Secondary | ICD-10-CM

## 2023-12-25 DIAGNOSIS — G43E09 Chronic migraine with aura, not intractable, without status migrainosus: Secondary | ICD-10-CM

## 2023-12-25 DIAGNOSIS — I272 Pulmonary hypertension, unspecified: Secondary | ICD-10-CM

## 2023-12-25 DIAGNOSIS — Z9189 Other specified personal risk factors, not elsewhere classified: Secondary | ICD-10-CM

## 2023-12-25 DIAGNOSIS — Z7409 Other reduced mobility: Secondary | ICD-10-CM

## 2023-12-25 DIAGNOSIS — M170 Bilateral primary osteoarthritis of knee: Secondary | ICD-10-CM

## 2023-12-25 DIAGNOSIS — J410 Simple chronic bronchitis: Secondary | ICD-10-CM

## 2023-12-25 DIAGNOSIS — G4700 Insomnia, unspecified: Secondary | ICD-10-CM | POA: Diagnosis not present

## 2023-12-25 DIAGNOSIS — I200 Unstable angina: Secondary | ICD-10-CM

## 2023-12-25 DIAGNOSIS — R0789 Other chest pain: Secondary | ICD-10-CM

## 2023-12-25 MED ORDER — NITROGLYCERIN 0.4 MG SL SUBL: 0.4000 mg | SUBLINGUAL_TABLET | SUBLINGUAL | 6 refills | Status: DC | PRN

## 2023-12-25 MED ORDER — NURTEC 75 MG PO TBDP: 75.0000 mg | ORAL_TABLET | ORAL | 3 refills | Status: DC

## 2023-12-25 MED ORDER — LURASIDONE HCL 20 MG PO TABS: 20.0000 mg | ORAL_TABLET | Freq: Every evening | ORAL | 3 refills | Status: AC

## 2023-12-25 NOTE — Progress Notes (Unsigned)
 12/25/2023 Name: Michele Owens MRN: [id number] DOB: 1961-02-13  Michele Owens is a 63 y.o. year old female who was referred for medication management by their primary care provider, Michele Beverley NOVAK, MD. They presented for a face to face visit today.   They were referred to the pharmacist by their PCP for assistance in managing complex medication management   Subjective:  Care Team: Primary Care Provider: [name], MD ; Next Scheduled Visit: 01/11/2024  Medication Access/Adherence  Current Pharmacy:  [name] - [place], [place] - [address] [place] [address] Phone: [phone] Fax: [phone]  -Patient reports affordability concerns with their medications: No  -Patient reports access/transportation concerns to their pharmacy: No  -Patient reports adherence concerns with their medications:  No    Diabetes: Current medications: Farxiga  10mg  daily  -Patient does monitor home BG TID, but did not have glucose readings on hand -Does need a refill on Accu Chek Guide test strips  Macrovascular and Microvascular Risk Reduction:  Statin? yes (rosuvastatin  10mg  daily); ACEi/ARB? yes (losartan  25mg  daily) Last urinary albumin /creatinine ratio:  Lab Results  Component Value Date   MICRALBCREAT 92.4 (H) 12/05/2023   Last eye exam:   Last foot exam: No foot exam found Tobacco Use:  Tobacco Use: Medium Risk (12/25/2023)   Patient History    Smoking Tobacco Use: Former    Smokeless Tobacco Use: Never    Passive Exposure: Not on file   Hypertension: Current medications: losartan  25mg  daily, torsemide  40mg  BID, carvedilol  6.25mg  BID -Medications previously tried: spironolactone  25mg  stopped today due to low BP -Patient does not monitor home BP, but OV reading toady was 113/58  Medication Management: Current adherence strategy: Patient receives medications from [facility], and these are delivered to her home.   She is interested in bubble packs, which she used to get through [facility].  It is unclear why medications are no longer packaged, but she would like this done again. -Patient reports Good adherence to medications -Patient brought in all medications, and those she is no longer to be taken were removed (spironolactone  25mg , levothyroxine  ). -Patient also had buspirone  5mg  in addition to 30mg  she was most recently prescribed.  She does not take these together, but states she will take 5mg  instead of 30mg  sometimes.  She states 30mg  will sometimes cause her to talk in her sleep. -Patient was prescribed Nurtec 75mg  for migraines today as well as Latuda  20mg  for depression -Patient is taking trazodone  300mg  at bedtime but states that this is not helping with sleep; she also seems anxious and was tearful during some points of our visit -She is requesting refills on Accu Chek guide test strips, dicyclomine  20mg , and ondansetron  -Requesting current medication list to be faxed to [facility] where she has an office visit tomorrow  Objective:  Lab Results  Component Value Date   HGBA1C 6.8 (H) 09/09/2023   Lab Results  Component Value Date   CREATININE 1.06 (H) 11/29/2023   BUN 16 11/29/2023   NA 147 (H) 11/29/2023   K 3.9 11/29/2023   CL 104 11/29/2023   CO2 29 11/29/2023   Lab Results  Component Value Date   CHOL 208 (H) 12/05/2023   HDL 66.50 12/05/2023   LDLCALC 96 12/05/2023   TRIG 230.0 (H) 12/05/2023   CHOLHDL 3 12/05/2023   Assessment/Plan:   Diabetes: -Currently controlled; goal A1c <7%. Cardiorenal risk reduction is optimized.. Blood pressure is at goal <130/80. LDL  is not at goal.  -Continue current regimen at this time -Refill pending to go to [facility] for Accu Check guide test strips  Hypertension: -Currently controlled -Continue current regimen at this time -Sees Dr. Sebastian again 10/3 for BP check  Medication Management: -Currently strategy sufficient to  maintain appropriate adherence to prescribed medication regimen -Refills pending for ondansetron  and dicyclomine  -R   Follow Up Plan: ***  ***

## 2023-12-25 NOTE — Patient Instructions (Addendum)
 [facility]  [facility], [facility] VBCI Assistant Direct Dial: [phone]  Fax: [phone]    VISIT SUMMARY: Today, we addressed your chronic migraines, chest pain, breathing issues, mood disturbances, kidney function, heart conditions, and other health concerns. We made several adjustments to your medications and provided referrals for further care.  YOUR PLAN: CHRONIC MIGRAINES: You have severe and persistent migraines that are not relieved by Tylenol . -Start taking Nurtec 75 mg for migraine prevention and treatment.  CHEST PAIN AND DYSPNEA: You experience chest pain and shortness of breath, especially with exertion and when lying down. -Consider using nitroglycerin  for chest pain episodes. -Continue following up with your cardiologist.  CHRONIC OBSTRUCTIVE PULMONARY DISEASE (COPD) AND OXYGEN  DEPENDENCE: You have COPD and require continuous oxygen  therapy, which causes nasal congestion and nosebleeds. -Ensure continuous oxygen  supply with a humidifier and proper tubing. -Follow up with your pulmonologist.  MOOD DISTURBANCE AND PSYCHOTIC SYMPTOMS: You have depression and anxiety, worsened by the loss of your son, and experience auditory hallucinations. -Start taking Latuda  20 mg at bedtime with a snack. -Continue taking trazodone  300 mg at bedtime and buspirone  30 mg twice daily. -Refer to psychiatry and psychology for ongoing management.  CHRONIC KIDNEY DISEASE (CKD): You have stage 3 CKD and need to monitor your kidney function and stay hydrated. -Ensure you are drinking enough water . -Monitor your kidney function regularly.  HYPERTENSION: You have high blood pressure and are on multiple medications. Your blood pressure is currently low. -Stop taking spironolactone  25 mg. -Continue taking losartan  and carvedilol . -Take torsemide  40 mg twice daily. -Follow up in one week to check your blood pressure.  CHRONIC PAIN DUE TO  OSTEOARTHRITIS: You have chronic pain in your knees and feet due to osteoarthritis, which limits your mobility. -Refer to pain management. -Continue follow-up with orthopedics. -Refer to home health for physical therapy and mobility support.  TYPE 2 DIABETES MELLITUS WITH DIABETIC NEUROPATHY: You have diabetes with associated nerve pain. -Refer to podiatry for diabetic foot care. -Refer to ophthalmology for a diabetic eye exam.  GENERAL HEALTH MAINTENANCE: Ongoing management of your overall health. -Continue taking fenofibrate  145 mg daily and rosuvastatin  10 mg daily for cholesterol. -Continue taking vitamin D  50,000 IU weekly.

## 2023-12-25 NOTE — Progress Notes (Signed)
 Assessment & Plan   Assessment/Plan:   Total time spent caring for the patient today was 76 minutes. This includes time spent before the visit reviewing the chart, time spent during the visit, and time spent after the visit on documentation, etc.  Assessment and Plan Assessment & Plan Type 2 diabetes mellitus with diabetic neuropathy Diabetes with associated neuropathy requiring ongoing management and monitoring. - Refer to podiatry for diabetic foot care - Refer to ophthalmology for diabetic eye exam  Chronic systolic and diastolic heart failure Chronic heart failure with orthopnea and dyspnea, managed with goal-directed therapy. Reports chest pain and dyspnea, likely related to comorbidities. - Continue goal-directed therapy - Consider nitroglycerin  for episodes of chest pain - Follow up closely with cardiology  Coronary artery disease with angina Angina with episodes of chest pain during exertion and at night, requiring ongoing management. - Consider nitroglycerin  for episodes of chest pain - Follow up with cardiology  Hypertension Hypertension with low blood pressure readings. Currently on multiple antihypertensive medications. Spironolactone  is held due to low blood pressure and potential dehydration. - Continue losartan  25 mg daily - Continue carvedilol  6.25 mg BID - Stop spironolactone  25 mg - Continue torsemide  40 mg BID - Follow up in one week to check blood pressure  Chronic kidney disease stage 3 CKD stage 3 with GFR of 59, requiring monitoring and management. Emphasis on adequate hydration to support renal function. - Ensure adequate hydration - Monitor renal function  Chronic obstructive pulmonary disease and pulmonary hypertension COPD with pulmonary hypertension requiring ongoing management and follow-up with pulmonology. - Follow up with pulmonology  Oxygen  dependence Oxygen  dependence due to multiple comorbidities including COPD and heart failure,  requiring continuous oxygen  supply with humidifier and tubing. - Ensure continuous oxygen  supply with humidifier and tubing - Coordinate with DME for home oxygen  needs  Obstructive sleep apnea Obstructive sleep apnea contributing to sleep disturbances, requiring coordination with sleep medicine. - Coordinate follow-up with sleep medicine  Morbid obesity and obesity hypoventilation syndrome Morbid obesity contributing to hypoventilation syndrome and other comorbidities.  Chronic pain due to osteoarthritis of knees and feet Chronic pain due to osteoarthritis, particularly in knees and feet, with limited mobility due to pain and obesity. Expresses desire for physical therapy and mobility support. - Refer to pain management - Continue follow-up with orthopedics - Refer to home health for physical therapy and mobility support  Bipolar disorder with depression and anxiety Bipolar disorder with moderate depression and anxiety. Current medications include buspirone  and trazodone . Consideration of Latuda  for mood stabilization and sleep improvement. Expresses significant distress related to family dynamics and loss of her son. - Start Latuda  20 mg QHS with a snack - Continue trazodone  300 mg QHS - Continue buspirone  30 mg BID - Refer to psychiatry and psychology for ongoing management  Chronic migraine with aura Chronic migraines with aura, previously treated with butalbital , now contraindicated due to allergy. Consideration of Nurtec for prophylaxis and acute treatment. Reports severe headaches and dissatisfaction with current management. - Start Nurtec 75 mg every other day for migraine prophylaxis and abortive therapy - Refer to neurology   Hyperlipidemia Hyperlipidemia managed with fenofibrate  and rosuvastatin . - Continue fenofibrate  145 mg daily - Continue rosuvastatin  10 mg daily  Vitamin D  deficiency Vitamin D  deficiency managed with supplementation. - Continue vitamin D  50,000 IU  weekly      Medications Discontinued During This Encounter  Medication Reason   nystatin  cream (MYCOSTATIN )    senna-docusate (SENOKOT-S) 8.6-50 MG tablet  ondansetron  (ZOFRAN -ODT) 4 MG disintegrating tablet    aspirin  EC 81 MG tablet    spironolactone  (ALDACTONE ) 25 MG tablet    butalbital -acetaminophen -caffeine  (FIORICET ) 50-325-40 MG tablet    nitroGLYCERIN  (NITROSTAT ) 0.4 MG SL tablet Reorder     Return in about 1 week (around 01/01/2024) for check blood pressure.        Subjective:   Encounter date: 12/25/2023  Michele Owens is a 63 y.o. female who has Tobacco abuse; OSA (obstructive sleep apnea); Non-insulin  dependent type 2 diabetes mellitus (HCC); Leukocytosis; Controlled type 2 diabetes mellitus without complication, without long-term current use of insulin  (HCC); Depression with anxiety; Benign essential HTN; Normocytic anemia; Chest pain; Obesity hypoventilation syndrome (HCC); CAP (community acquired pneumonia); Multinodular goiter w/ dominant right thyroid  nodule; Pulmonary hypertension (HCC); Chronic hypoxic respiratory failure (HCC); Acute hypoxemic respiratory failure (HCC); Acute pulmonary edema (HCC); HLD (hyperlipidemia); COPD (chronic obstructive pulmonary disease) (HCC); Gout; DVT (deep vein thrombosis) in pregnancy; Thyroid  cancer (HCC); Hypocalcemia; Hypomagnesemia; Atypical chest pain; Family history of thyroid  cancer; Encounter for geriatric assessment; COPD exacerbation (HCC); Acute and chronic respiratory failure with hypercapnia (HCC); Pulmonary HTN (HCC); Essential hypertension; Obesity; CHF (congestive heart failure) (HCC); Fall at home, initial encounter; HNP (herniated nucleus pulposus), thoracic; Severe obesity (BMI >= 40) (HCC); Acute on chronic respiratory failure with hypoxia (HCC); Hemoptysis; Encephalopathy; Chronic diastolic CHF (congestive heart failure) (HCC); Sepsis (HCC); Respiratory failure with hypercapnia (HCC); Anemia; Acute kidney  injury superimposed on chronic kidney disease (HCC); Hypothyroidism; Chronic pain disorder; Acute on chronic diastolic (congestive) heart failure (HCC); Fever; Current mild episode of major depressive disorder without prior episode (HCC); Diabetic peripheral neuropathy (HCC); Anxiety; Acute on chronic diastolic CHF (congestive heart failure) (HCC); Acute respiratory failure with hypoxia and hypercapnia (HCC); Acute respiratory failure with hypercapnia (HCC); Delirium due to multiple etiologies, acute, hyperactive; At risk for polypharmacy; Dependence on continuous supplemental oxygen ; History of COPD; Bipolar disorder (HCC); Cellulitis; UTI (urinary tract infection); Hypokalemia; Vitamin D  deficiency; Chronic idiopathic constipation; Mild episode of recurrent major depressive disorder (HCC); GAD (generalized anxiety disorder); Former smoker; 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   She  has a past medical history of Anxiety, Arthritis, Asthma, Chronic diastolic CHF (congestive heart failure) (HCC), COPD (chronic obstructive pulmonary disease) (HCC), Depression, Diabetes mellitus without complication (HCC), GERD (gastroesophageal reflux disease), Gout, Headache, History of DVT of lower extremity, Hypertension, Morbid obesity with BMI of 45.0-49.9, adult (HCC), and Thyroid  cancer (HCC) (09/23/2016)..   She presents with chief complaint of Anxiety (2 week follow up. Pt is requesting DME for oxygen  tube ([facility]) //HM due- vaccines (records request) diabetic eye and foot exam), Depression, Insomnia (Pt stated she is unable to sleep; she is currently taking trazodone  300MG  ), and Migraine (Pt c/o of ongoing migraines; she is currently taking OTC tylenol  650MG ; Rx refill request for (FIORICET ) 50-325-40 MG tablet) .    Discussed the use of AI scribe software for clinical note transcription with the patient, who gave verbal consent to proceed.  History of Present Illness  12/05/2023  Letonia Stead is a 63 year old female with diabetes, hypertension, hyperlipidemia, and chronic heart failure who presents for follow-up and medication management.  She is here for chronic management of diabetes, hypertension, hyperlipidemia, and chronic heart failure. She requests diabetic shoes and medication refills. Her last A1c was 6.8 in June 2025,  which is at goal. She takes Farxiga  10 mg daily for diabetes and heart failure.  She is morbidly obese with a BMI of 52 and has chronic diastolic heart failure, COPD, and significant osteoarthritis. She is on chronic oxygen  therapy at 2 liters per minute for COPD. For hyperlipidemia, she takes fenofibrate  145 mg daily and rosuvastatin  10 mg QHS. For hypertension and renal protection due to diabetes, she takes losartan  25 mg daily. She also takes spironolactone  25 mg for heart failure and torsemide  40 mg BID.  She has a history of insomnia and major depressive disorder, for which she takes trazodone  100 mg at night. For COPD, she uses a Breo Ellipta inhaler once daily and budesonide  (Pulmicort ) 0.25 mg/2 mL nebulizer BID. She also takes potassium chloride  10 mEq BID due to chronic diuresis from torsemide . For GERD, she takes omeprazole  40 mg daily.  She has coronary artery disease with stable angina and uses nitroglycerin  as needed. She takes aspirin  81 mg daily for CAD. She has hypothyroidism, taking levothyroxine  125 mcg daily, and a history of multinodular goiter. Recent thyroid  testing showed normal free T4 and elevated TSH at 4. She has a history of pulmonary hypertension and chronic hypoxemic respiratory failure associated with COPD, necessitating oxygen  use.  She has a history of bipolar disorder. She reports significant stress related to family issues, stating 'I'm really  stressed out with my son and my daughter.' No intent to harm herself but expresses a need to talk to someone about her stress and anxiety. She takes buspirone  5 mg TID for anxiety and depression.  She has obstructive sleep apnea and obesity hypoventilation syndrome. She reports being short of breath and mentions 'I've been getting short of breath.' She has a history of iron deficiency anemia, taking ferrous sulfate  25 mg daily. She also takes vitamin D , ergocalciferol  50,000 units weekly for deficiency.  She has a history of pneumonia in May 2025 and is due for shingles and pneumonia vaccinations. She is a former smoker, having quit 24-25 years ago, and denies any current use of drugs or alcohol. She has a nurse who visits her home. She follows with orthopedics for chronic foot pain and diabetic neuropathy.  12/25/2023  Michele Owens is a 63 year old female with chronic migraines who presents for follow-up care.  Cephalalgia (chronic migraines) - Chronic, severe, and persistent migraines - Current major headache at time of visit - Tylenol  (two tablets) provides no relief - Previously used butalbital , now listed as an allergy due to nausea and vomiting - Frustration regarding lack of effective medication  Chest pain and dyspnea - Chest pain and shortness of breath, especially with exertion and when lying down at night - Pain described as occurring 'up in here' when getting into bed, takes a while to subside - No current use of nitroglycerin  and uncertainty about its use - Chronic orthopnea - History of significant heart disease with ongoing cardiology follow-up  Chronic obstructive pulmonary disease and oxygen  dependence - Baseline oxygen  therapy at two liters - Uses Breo Ellipta inhaler and budesonide  nebulizers - Oxygen  machine causes nasal congestion and epistaxis, requiring humidifier and additional tubing  Mood disturbance and psychotic symptoms - History of depression and  anxiety, worsened by loss of her son - Increasing depressive symptoms and family stress, feeling unsupported - Daily conversations with deceased son - Auditory hallucinations, such as hearing someone on her patio - Takes buspirone  30 mg twice daily and trazodone  300 mg at bedtime  Renal dysfunction  and hydration concerns - Chronic kidney disease with nephrology involvement - Scheduled for dialysis - Concerned about renal function and hydration status - Drinks large amounts of water  - Referred for colonoscopy  Hypertension, heart failure, and hyperlipidemia - Takes spironolactone  125 mg daily for blood pressure and heart failure - Takes torsemide  40 mg daily for hypertension - Takes fenofibrate  145 mg daily and rosuvastatin  10 mg daily for hyperlipidemia - Takes vitamin D  50,000 IU weekly - Concerned about dehydration  Peripheral neuropathy and musculoskeletal pain - Chronic foot pain and neuropathy affecting mobility - Uses a walker for ambulation - Limited mobility due to obesity and oxygen  dependence - Referred to podiatry for diabetic foot care - Interested in evaluation for arthritis by a bone specialist - Desires increased physical activity but limited by current health status      12/25/2023    1:45 PM 12/05/2023   10:30 AM 11/02/2023    2:59 PM 02/07/2023    1:43 PM 03/21/2021    4:06 PM  Depression screen PHQ 2/9  Decreased Interest 0 0 0 0 0  Down, Depressed, Hopeless 3 0 3 3 0  PHQ - 2 Score 3 0 3 3 0  Altered sleeping 3  3 3    Tired, decreased energy 2  2 3    Change in appetite 0  0 0   Feeling bad or failure about yourself  1  1 3    Trouble concentrating 0  2 3   Moving slowly or fidgety/restless 0  1 0   Suicidal thoughts 0  0 0   PHQ-9 Score 9  12 15    Difficult doing work/chores Somewhat difficult  Somewhat difficult        12/25/2023    1:46 PM 11/02/2023    3:03 PM 02/07/2023    1:44 PM  GAD 7 : Generalized Anxiety Score  Nervous, Anxious, on Edge 1  3 3   Control/stop worrying 3 3 3   Worry too much - different things 3 3 3   Trouble relaxing 3 3 3   Restless 3 3 3   Easily annoyed or irritable 3 3 3   Afraid - awful might happen 3 3 3   Total GAD 7 Score 19 21 21   Anxiety Difficulty Very difficult Somewhat difficult       ROS  Past Surgical History:  Procedure Laterality Date   BUNIONECTOMY Bilateral    CARDIAC CATHETERIZATION N/A 06/23/2015   Procedure: Right/Left Heart Cath and Coronary Angiography;  Surgeon: Ezra GORMAN Shuck, MD;  Location: [facility] INVASIVE CV LAB;  Service: Cardiovascular;  Laterality: N/A;   COLONOSCOPY WITH PROPOFOL  N/A 12/15/2015   Procedure: COLONOSCOPY WITH PROPOFOL ;  Surgeon: Norleen Hint, MD;  Location: [facility] ENDOSCOPY;  Service: Endoscopy;  Laterality: N/A;   RIGHT HEART CATH N/A 10/01/2019   Procedure: RIGHT HEART CATH;  Surgeon: Shuck Ezra GORMAN, MD;  Location[facility] INVASIVE CV LAB;  Service: Cardiovascular;  Laterality: N/A;   THYROIDECTOMY  09/19/2016   THYROIDECTOMY N/A 09/19/2016   Procedure: TOTAL THYROIDECTOMY;  Surgeon: Eletha Boas, MD;  Location: [facility] OR;  Service: General;  Laterality: N/A;   TONSILLECTOMY     TOTAL ABDOMINAL HYSTERECTOMY  07/14/10    Outpatient Medications Prior to Visit  Medication Sig Dispense Refill   ACETAMINOPHEN  PO Take 1,300-2,050 mg by mouth daily as needed for moderate pain (pain score 4-6) or headache.     albuterol  (VENTOLIN  HFA) 108 (90 Base) MCG/ACT inhaler Inhale 1-2 puffs into the lungs every 6 (six) hours as needed for  wheezing or shortness of breath. 1 each 0   allopurinol  (ZYLOPRIM ) 100 MG tablet Take 1 tablet (100 mg total) by mouth daily. 90 tablet 3   aspirin  EC 81 MG tablet Take 1 tablet (81 mg total) by mouth daily. Swallow whole. 90 tablet 3   blood glucose meter kit and supplies KIT Dispense based on patient and insurance preference. Use up to four times daily as directed. 1 each 0   Blood Glucose Monitoring Suppl DEVI 1 each by Does not apply route in the morning, at  noon, and at bedtime. May substitute to any manufacturer covered by patient's insurance. 1 each 0   budesonide  (PULMICORT ) 0.25 MG/2ML nebulizer solution Inhale 2 mLs (0.25 mg total) by nebulization 2 (two) times daily. 120 mL 0   busPIRone  (BUSPAR ) 30 MG tablet Take 1 tablet (30 mg total) by mouth 2 (two) times daily. 180 tablet 3   carvedilol  (COREG ) 6.25 MG tablet Take 1 tablet (6.25 mg total) by mouth 2 (two) times daily. 60 tablet 11   clotrimazole  (LOTRIMIN ) 1 % cream Apply topically 2 (two) times daily. 30 g 0   diclofenac  Sodium (VOLTAREN ) 1 % GEL Apply 4 g topically 4 (four) times daily. 120 g 0   dicyclomine  (BENTYL ) 20 MG tablet TAKE 1 TABLET (20 MG TOTAL) BY MOUTH 2 (TWO) TIMES DAILY. 20 tablet 0   EPINEPHrine  0.3 mg/0.3 mL IJ SOAJ injection Inject 0.3 mg into the muscle once as needed for anaphylaxis.     FARXIGA  10 MG TABS tablet Take 1 tablet (10 mg total) by mouth daily before breakfast. 30 tablet 11   fenofibrate  (TRICOR ) 145 MG tablet Take 1 tablet (145 mg total) by mouth daily. 30 tablet 11   FEROSUL 325 (65 Fe) MG tablet Take 1 tablet (325 mg total) by mouth daily. 30 tablet 1   gabapentin (NEURONTIN) 100 MG capsule Take 1 capsule 3 times a day by oral route as needed for 30 days.     levocetirizine (XYZAL ) 5 MG tablet TAKE 1 TABLET (5 MG TOTAL) BY MOUTH AT BEDTIME. 30 tablet 0   levothyroxine  (SYNTHROID ) 125 MCG tablet Take 1 tablet (125 mcg total) by mouth at bedtime. 90 tablet 0   losartan  (COZAAR ) 25 MG tablet Take 1 tablet (25 mg total) by mouth daily. 30 tablet 11   omeprazole  (PRILOSEC) 20 MG capsule Take 2 capsules (40 mg total) by mouth daily. 180 capsule 1   OXYGEN  Inhale 2 L into the lungs continuous.     potassium chloride  (KLOR-CON ) 10 MEQ tablet Take 1 tablet (10 mEq total) by mouth 2 (two) times daily. 60 tablet 6   rosuvastatin  (CRESTOR ) 10 MG tablet Take 1 tablet (10 mg total) by mouth every evening. 30 tablet 11   traZODone  (DESYREL ) 300 MG tablet Take 1  tablet (300 mg total) by mouth at bedtime. 90 tablet 3   umeclidinium-vilanterol (ANORO ELLIPTA ) 62.5-25 MCG/ACT AEPB Inhale 1 puff into the lungs daily. 98 each 0   Vitamin D , Ergocalciferol , (DRISDOL ) 1.25 MG (50000 UNIT) CAPS capsule Take 1 capsule (50,000 Units total) by mouth every 7 (seven) days. 12 capsule 0   aspirin  EC 81 MG tablet Take 81 mg by mouth daily. Swallow whole.     butalbital -acetaminophen -caffeine  (FIORICET ) 50-325-40 MG tablet Take 1-2 tablets by mouth every 6 (six) hours as needed for headache. 20 tablet 0   spironolactone  (ALDACTONE ) 25 MG tablet Take 1 tablet (25 mg total) by mouth daily. 30 tablet 11  calcitRIOL  (ROCALTROL ) 0.5 MCG capsule Take 1 capsule (0.5 mcg total) by mouth daily. (Patient not taking: Reported on 12/25/2023) 30 capsule 0   colchicine  0.6 MG tablet TAKE 1 TABLET (0.6 MG TOTAL) BY MOUTH 2 (TWO) TIMES DAILY FOR 5 DAYS. 10 tablet 0   torsemide  (DEMADEX ) 20 MG tablet Take 2 tablets (40 mg total) by mouth 2 (two) times daily. (Patient not taking: Reported on 12/25/2023) 120 tablet 11   nitroGLYCERIN  (NITROSTAT ) 0.4 MG SL tablet Place 1 tablet (0.4 mg total) under the tongue every 5 (five) minutes as needed for chest pain. (Patient not taking: Reported on 12/25/2023) 12 tablet 6   nystatin  cream (MYCOSTATIN ) Apply 1 Application topically 2 (two) times daily as needed for dry skin. (Patient not taking: Reported on 12/25/2023)     ondansetron  (ZOFRAN -ODT) 4 MG disintegrating tablet Take 1 tablet (4 mg total) by mouth every 8 (eight) hours as needed. (Patient not taking: Reported on 12/25/2023) 20 tablet 0   senna-docusate (SENOKOT-S) 8.6-50 MG tablet Take 1 tablet by mouth daily. (Patient not taking: Reported on 12/25/2023) 30 tablet 5   No facility-administered medications prior to visit.    Family History  Problem Relation Age of Onset   Cancer Father        thought to be due to exposure to concrete   Diabetes Mother    Thyroid  cancer Mother        dx in  her 101s-60s   Cancer Maternal Uncle        2 uncles with cancer NOS   Brain cancer Paternal Aunt    Cancer Cousin        maternal first cousin - NOS   Cancer Cousin        maternal first cousin - NOS    Social History   Socioeconomic History   Marital status: Single    Spouse name: Not on file   Number of children: 1   Years of education: Not on file   Highest education level: Not on file  Occupational History   Occupation: disabled  Tobacco Use   Smoking status: Former    Current packs/day: 0.00    Average packs/day: 0.5 packs/day for 41.0 years (20.5 ttl pk-yrs)    Types: Cigarettes    Start date: 07/1975    Quit date: 07/2016    Years since quitting: 7.4   Smokeless tobacco: Never   Tobacco comments:    Pt stop smoking 07/04/2011  Vaping Use   Vaping status: Never Used  Substance and Sexual Activity   Alcohol use: Not Currently    Comment: quit 12 years ago   Drug use: Not Currently    Types: Marijuana, Cocaine    Comment: quit 12 years ago   Sexual activity: Yes  Other Topics Concern   Not on file  Social History Narrative   Not on file   Social Drivers of Health   Financial Resource Strain: Low Risk  (10/24/2022)   Overall Financial Resource Strain (CARDIA)    Difficulty of Paying Living Expenses: Not very hard  Food Insecurity: No Food Insecurity (09/09/2023)   Hunger Vital Sign    Worried About Running Out of Food in the Last Year: Never true    Ran Out of Food in the Last Year: Never true  Transportation Needs: No Transportation Needs (09/09/2023)   PRAPARE - Administrator, Civil Service (Medical): No    Lack of Transportation (Non-Medical): No  Physical Activity: Inactive (02/01/2021)  Exercise Vital Sign    Days of Exercise per Week: 0 days    Minutes of Exercise per Session: 0 min  Stress: Stress Concern Present (02/23/2021)   [employer] of Occupational Health - Occupational Stress Questionnaire    Feeling of Stress : To some  extent  Social Connections: Unknown (08/21/2021)   Received from [facility]   Social Network    Social Network: Not on file  Intimate Partner Violence: Not At Risk (09/09/2023)   Humiliation, Afraid, Rape, and Kick questionnaire    Fear of Current or Ex-Partner: No    Emotionally Abused: No    Physically Abused: No    Sexually Abused: No                                                                                                  Objective:  Physical Exam: BP (!) 113/58 (BP Location: Left Arm, Patient Position: Sitting, Cuff Size: Large) Comment: recheck after resting  Pulse 82   Temp (!) 97.2 F (36.2 C) (Temporal)   Resp 18   Wt (!) 318 lb 6.4 oz (144.4 kg)   SpO2 95% Comment: pt is 2liters of oxygen  portable  BMI 52.98 kg/m     Physical Exam Constitutional:      General: She is not in acute distress.    Appearance: Normal appearance. She is not ill-appearing or toxic-appearing.     Interventions: Nasal cannula in place.     Comments: 2L/min of O2  HENT:     Head: Normocephalic and atraumatic.     Right Ear: Hearing, tympanic membrane, ear canal and external ear normal.     Left Ear: Hearing, tympanic membrane, ear canal and external ear normal.     Nose: Nose normal. No congestion.     Right Sinus: No maxillary sinus tenderness or frontal sinus tenderness.     Left Sinus: No maxillary sinus tenderness or frontal sinus tenderness.     Mouth/Throat:     Mouth: Mucous membranes are moist.     Dentition: Normal dentition.     Pharynx: Oropharynx is clear.     Tonsils: No tonsillar exudate. 0 on the right. 0 on the left.  Eyes:     General: No scleral icterus.       Right eye: No discharge.        Left eye: No discharge.     Extraocular Movements: Extraocular movements intact.  Neck:     Thyroid : No thyroid  mass.  Cardiovascular:     Rate and Rhythm: Normal rate and regular rhythm.     Pulses: Normal pulses.     Heart sounds: Normal heart sounds.      Comments: No cyanosis, no JVD Pulmonary:     Effort: Pulmonary effort is normal. No respiratory distress.     Breath sounds: Normal breath sounds.     Comments: No auditory wheezing Abdominal:     General: Abdomen is flat. Bowel sounds are normal.     Palpations: Abdomen is soft.  Musculoskeletal:        General: Normal range of  motion.     Cervical back: Full passive range of motion without pain, normal range of motion and neck supple.     Right lower leg: 1+ Pitting Edema present.     Left lower leg: 1+ Pitting Edema present.     Comments: Normal Ambulation. No clubbing  Lymphadenopathy:     Cervical: No cervical adenopathy.  Skin:    General: Skin is warm and dry.     Findings: No rash.  Neurological:     General: No focal deficit present.     Mental Status: She is alert and oriented to person, place, and time. Mental status is at baseline.     Cranial Nerves: Cranial nerves 2-12 are intact. No cranial nerve deficit.     Gait: Gait abnormal (ambulates with walker due).  Psychiatric:        Mood and Affect: Mood normal.        Behavior: Behavior normal.        Thought Content: Thought content normal.        Judgment: Judgment normal.     DG Ankle Complete Right Result Date: 12/15/2023 EXAM: 3 or more VIEW(S) XRAY OF THE ANKLE 12/15/2023 09:01:00 PM CLINICAL HISTORY: Trauma to ankle. Rt ankle pain. COMPARISON: X-ray of the ankle dated 01/16/2021 . FINDINGS: BONES AND JOINTS: Corticated density distal to medial malleolus consistent with old injury. Midfoot degenerative changes. Moderate plantar calcaneal spur. SOFT TISSUES: The soft tissues are unremarkable. IMPRESSION: 1. No acute osseous abnormality. Electronically signed by: Norman Gatlin MD 12/15/2023 09:05 PM EDT RP Workstation: [id number]   DG Foot Complete Right Result Date: 12/15/2023 CLINICAL DATA:  Foot pain after trauma. EXAM: RIGHT FOOT COMPLETE - 3+ VIEW COMPARISON:  None Available. FINDINGS: No acute bony abnormality.  Specifically, no fracture, subluxation, or dislocation. Plantar calcaneal spur. Soft tissues are intact. IMPRESSION: No acute bony abnormality. Electronically Signed   By: Franky Crease M.D.   On: 12/15/2023 20:18   DG Ankle Complete Left Result Date: 12/15/2023 CLINICAL DATA:  Trauma to ankle. EXAM: LEFT ANKLE COMPLETE - 3+ VIEW COMPARISON:  Foot series today FINDINGS: No acute bony abnormality. Specifically, no fracture, subluxation, or dislocation. 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 Plantar calcaneal spur. Soft tissues are intact. IMPRESSION: No acute bony abnormality. Electronically Signed   By: Franky Crease M.D.   On: 12/15/2023 20:17   DG Foot Complete Left Result Date: 12/15/2023 CLINICAL DATA:  Foot pain EXAM: LEFT FOOT - COMPLETE 3+ VIEW COMPARISON:  None Available. FINDINGS: Is No acute bony abnormality. Specifically, no fracture, subluxation, or dislocation. Arthritic changes in the midfoot. Plantar calcaneal spur. Soft tissues are intact. IMPRESSION: No acute bony abnormality. Electronically Signed   By: Franky Crease M.D.   On: 12/15/2023 20:15   DG Cervical Spine 2-3 Views Result Date: 12/12/2023 CLINICAL DATA:  Pain after fall onto wood floor. EXAM: CERVICAL SPINE - 2-3 VIEW COMPARISON:  04/17/2017 FINDINGS: The skull base through C5 are visualized on the lateral view. C6, C7, and the cervicothoracic junction are obscured and not assessed. Alignment where visualized is normal. No evidence of acute fracture. Visualized disc spaces are preserved. The dens is not well assessed on provided views. Soft tissue attenuation from habitus further limits assessment. IMPRESSION: Limited exam, cannot assess C6 through the cervicothoracic junction. No evidence of acute fracture of the visualized cervical segments. Electronically Signed   By: Andrea Gasman M.D.   On: 12/12/2023 23:59   DG Hip Unilat W or Wo Pelvis 2-3 Views Right  Result Date: 12/12/2023 CLINICAL DATA:  Pain after fall onto the wood floor. EXAM:  DG HIP (WITH OR WITHOUT PELVIS) 2-3V RIGHT COMPARISON:  None Available. FINDINGS: No evidence of acute fracture of the pelvis or right hip. No hip dislocation. Pubic rami are intact. Hip joint spaces preserved. No pubic symphyseal or sacroiliac diastasis. IMPRESSION: No fracture or dislocation of the pelvis or right hip. Electronically Signed   By: Andrea Gasman M.D.   On: 12/12/2023 23:57   DG Lumbar Spine Complete Result Date: 12/12/2023 CLINICAL DATA:  Pain after fall onto wood floor. EXAM: LUMBAR SPINE - COMPLETE 4+ VIEW COMPARISON:  10/19/2023 FINDINGS: Five non-rib-bearing lumbar vertebra. No evidence of acute fracture. Posterior elements are not well assessed due to soft tissue attenuation from habitus. No traumatic subluxation. Vertebral body heights are normal. Disc space narrowing and spurring throughout most prominent at L2-L3. Lower lumbar facet hypertrophy. No sacroiliac diastasis. IMPRESSION: 1. No acute fracture or subluxation of the lumbar spine. 2. Multilevel degenerative disc disease and facet hypertrophy. Electronically Signed   By: Andrea Gasman M.D.   On: 12/12/2023 23:57   DG Shoulder Right Result Date: 12/12/2023 CLINICAL DATA:  Status post fall onto wood floor. EXAM: RIGHT SHOULDER - 2+ VIEW COMPARISON:  05/13/2023 FINDINGS: Technically limited due to difficulty with positioning and habitus. Allowing for this, no fracture or dislocation. Mild acromioclavicular degenerative spurring. Probable degenerative subchondral cyst in the glenoid. No soft tissue calcifications. IMPRESSION: 1. No fracture or dislocation of the right shoulder. 2. Acromioclavicular and glenohumeral degenerative change. Electronically Signed   By: Andrea Gasman M.D.   On: 12/12/2023 23:56   LONG TERM MONITOR-LIVE TELEMETRY (3-14 DAYS) Result Date: 12/03/2023 Patch Wear Time:  12 days and 0 hours (2025-02-12T15:41:24-0500 to 2025-02-24T16:19:24-0500) Patient had a min HR of 52 bpm, max HR of 214 bpm, and avg  HR of 77 bpm. Predominant underlying rhythm was Sinus Rhythm. 2 Supraventricular Tachycardia runs occurred, the run with the fastest interval lasting 2 mins 22 secs with a max rate of 214 bpm (avg 190 bpm); the run with the fastest interval was also the longest. True duration of Supraventricular Tachycardia difficult to ascertain due to artifact. Isolated SVEs were rare (<1.0%), SVE Couplets were rare (<1.0%), and SVE Triplets were rare (<1.0%). Isolated VEs were rare (<1.0%, 2900), VE Couplets were rare (<1.0%, 42), and VE Triplets were rare (<1.0%, 1). Ventricular Bigeminy and Trigeminy were present. Inverted QRS complexes possibly due to inverted placement of device. Conclusion: 1. Predominantly NSR. 2. Rare PVCs and PACs. 3. Possible run of SVT but obscured by artifact.   DG Chest 2 View Result Date: 11/29/2023 CLINICAL DATA:  cp EXAM: CHEST - 2 VIEW COMPARISON:  October 19, 2023 FINDINGS: The patient is rotated towards the right. Low lung volumes. No focal airspace consolidation or pleural effusion. Hazy attenuation of the left lung base on the frontal radiograph with blunting of the left costophrenic angle posteriorly. No pneumothorax. Mild cardiomegaly. Prominence of the right hilar region and paratracheal stripe, likely rotational. No acute fracture or destructive lesions. Multilevel thoracic osteophytosis. IMPRESSION: Low lung volumes. Hazy attenuation of the left lung base on the frontal radiograph, may represent atelectasis with a small pleural effusion. Electronically Signed   By: Rogelia Myers M.D.   On: 11/29/2023 16:21   CT Knee Left Wo Contrast Result Date: 10/19/2023 CLINICAL DATA:  Bilateral leg pain and lower back pain. Possible fracture on same day radiographs EXAM: CT OF THE LEFT KNEE WITHOUT CONTRAST TECHNIQUE: Multidetector  CT imaging of the left knee was performed according to the standard protocol. Multiplanar CT image reconstructions were also generated. RADIATION DOSE REDUCTION: This  exam was performed according to the departmental dose-optimization program which includes automated exposure control, adjustment of the mA and/or kV according to patient size and/or use of iterative reconstruction technique. COMPARISON:  Same day knee radiographs FINDINGS: Bones/Joint/Cartilage No acute fracture. No dislocation. The abnormality on same-day radiograph corresponds with a vascular channel in the lateral tibial plateau. Tricompartmental degenerative arthritis greatest in the patellofemoral and lateral compartments where it is moderate. Small knee joint effusion. Ligaments Suboptimally assessed by CT. Muscles and Tendons No acute abnormality. Soft tissues Unremarkable. IMPRESSION: 1. No acute fracture.  Small knee joint effusion. 2. Tricompartmental degenerative arthritis greatest in the patellofemoral and lateral compartments. Electronically Signed   By: Norman Gatlin M.D.   On: 10/19/2023 20:35   DG Lumbar Spine Complete Result Date: 10/19/2023 CLINICAL DATA:  Initial evaluation for acute lower back pain. EXAM: LUMBAR SPINE - COMPLETE 4+ VIEW COMPARISON:  Prior study from 06/26/2021 FINDINGS: Examination mildly limited by habitus. Five non rib-bearing lumbar type vertebral bodies. Mild dextroscoliosis. Vertebral body height maintained without acute or chronic fracture. Visualized pelvis intact. Moderate degenerative endplate spurring present throughout the lumbar spine. No visible soft tissue abnormality. IMPRESSION: 1. No radiographic evidence for acute osseous abnormality within the lumbar spine. 2. Moderate degenerative endplate spurring throughout the lumbar spine. Electronically Signed   By: Morene Hoard M.D.   On: 10/19/2023 19:27   DG Knee Complete 4 Views Right Result Date: 10/19/2023 CLINICAL DATA:  Initial evaluation for acute pain and swelling. EXAM: RIGHT KNEE - COMPLETE 4+ VIEW COMPARISON:  Prior study from 05/13/2023 FINDINGS: Diffuse osteopenia, somewhat limiting  evaluation for fine osseous detail. No acute fracture dislocation. Moderate tricompartmental degenerative osteoarthrosis. No joint effusion. No visible soft tissue abnormality. IMPRESSION: 1. No acute osseous abnormality. 2. Moderate tricompartmental degenerative osteoarthrosis. Electronically Signed   By: Morene Hoard M.D.   On: 10/19/2023 19:25   DG Knee Complete 4 Views Left Result Date: 10/19/2023 CLINICAL DATA:  Initial evaluation for acute pain and swelling EXAM: LEFT KNEE - COMPLETE 4+ VIEW COMPARISON:  None Available. FINDINGS: Diffuse osteopenia, limiting assessment for fine osseous detail. There is question of a subtle vertically oriented lucency extending through the lateral aspect of the tibial plateau. While this could reflect a small nutrient foramen or possibly summation of shadows, a possible subtle acute nondisplaced fractures difficult to exclude. No other acute fracture dislocation. Small joint effusion. Moderate tricompartmental degenerative osteoarthrosis no visible soft tissue abnormality. IMPRESSION: 1. Question subtle vertically oriented lucency extending through the lateral aspect of the tibial plateau. While this could reflect a small nutrient foramen or possibly summation of shadows, a possible subtle acute nondisplaced fracture is difficult to exclude. Correlation with physical exam for possible pain at this location recommended. Additionally, finding could be further assessed with dedicated CT as warranted. 2. Small joint effusion. 3. Moderate tricompartmental degenerative osteoarthrosis. Electronically Signed   By: Morene Hoard M.D.   On: 10/19/2023 19:24   DG Chest Portable 1 View Result Date: 10/19/2023 CLINICAL DATA:  Dyspnea. Shortness of breath on exertion. Home oxygen . EXAM: PORTABLE CHEST 1 VIEW COMPARISON:  06/10/2023 FINDINGS: Atherosclerotic calcification of the aortic arch. Mild enlargement of the cardiopericardial silhouette. Bandlike densities in the  lung bases and right mid lung favor atelectasis. Thoracic spondylosis. Indistinct pulmonary vasculature.  Thyroidectomy clips noted. IMPRESSION: 1. Mild enlargement of the cardiopericardial silhouette with  indistinct pulmonary vasculature. 2. Bandlike densities in the lung bases and right mid lung favor atelectasis. 3. Thoracic spondylosis. Electronically Signed   By: Ryan Salvage M.D.   On: 10/19/2023 17:51    Recent Results (from the past 2160 hours)  CBC with Differential     Status: Abnormal   Collection Time: 10/19/23  6:32 PM  Result Value Ref Range   WBC 16.9 (H) 4.0 - 10.5 K/uL   RBC 3.93 3.87 - 5.11 MIL/uL   Hemoglobin 11.9 (L) 12.0 - 15.0 g/dL   HCT 60.4 63.9 - 53.9 %   MCV 100.5 (H) 80.0 - 100.0 fL   MCH 30.3 26.0 - 34.0 pg   MCHC 30.1 30.0 - 36.0 g/dL   RDW 87.2 88.4 - 84.4 %   Platelets 306 150 - 400 K/uL   nRBC 0.0 0.0 - 0.2 %   Neutrophils Relative % 76 %   Neutro Abs 12.8 (H) 1.7 - 7.7 K/uL   Lymphocytes Relative 16 %   Lymphs Abs 2.7 0.7 - 4.0 K/uL   Monocytes Relative 6 %   Monocytes Absolute 1.0 0.1 - 1.0 K/uL   Eosinophils Relative 1 %   Eosinophils Absolute 0.2 0.0 - 0.5 K/uL   Basophils Relative 0 %   Basophils Absolute 0.1 0.0 - 0.1 K/uL   Immature Granulocytes 1 %   Abs Immature Granulocytes 0.12 (H) 0.00 - 0.07 K/uL    Comment: Performed at [facility], [address] [name], [place] [address]  Comprehensive metabolic panel     Status: Abnormal   Collection Time: 10/19/23  6:32 PM  Result Value Ref Range   Sodium 137 135 - 145 mmol/L   Potassium 4.1 3.5 - 5.1 mmol/L   Chloride 93 (L) 98 - 111 mmol/L   CO2 32 22 - 32 mmol/L   Glucose, Bld 130 (H) 70 - 99 mg/dL    Comment: Glucose reference range applies only to samples taken after fasting for at least 8 hours.   BUN 17 8 - 23 mg/dL   Creatinine, Ser 8.99 0.44 - 1.00 mg/dL   Calcium  8.3 (L) 8.9 - 10.3 mg/dL   Total Protein 6.9 6.5 - 8.1 g/dL   Albumin  3.9 3.5 - 5.0 g/dL   AST 18 15 -  41 U/L   ALT 13 0 - 44 U/L   Alkaline Phosphatase 55 38 - 126 U/L   Total Bilirubin 0.3 0.0 - 1.2 mg/dL   GFR, Estimated >39 >39 mL/min    Comment: (NOTE) Calculated using the CKD-EPI Creatinine Equation (2021)    Anion gap 12 5 - 15    Comment: Performed at [facility], [address] [place], [place] [address]  Magnesium      Status: None   Collection Time: 10/19/23  6:32 PM  Result Value Ref Range   Magnesium  2.1 1.7 - 2.4 mg/dL    Comment: Performed at [facility], [address] [place], [place] [address]  Brain natriuretic peptide     Status: None   Collection Time: 10/19/23  6:32 PM  Result Value Ref Range   B Natriuretic Peptide 31.7 0.0 - 100.0 pg/mL    Comment: Performed at [facility], [address] [place], [place] [address]  Urinalysis, Routine w reflex microscopic -Urine, Clean Catch     Status: Abnormal   Collection Time: 10/19/23  8:25 PM  Result Value Ref Range   Color, Urine STRAW (A) YELLOW  APPearance CLEAR CLEAR   Specific Gravity, Urine 1.006 1.005 - 1.030   pH 5.0 5.0 - 8.0   Glucose, UA >=500 (A) NEGATIVE mg/dL   Hgb urine dipstick NEGATIVE NEGATIVE   Bilirubin Urine NEGATIVE NEGATIVE   Ketones, ur NEGATIVE NEGATIVE mg/dL   Protein, ur NEGATIVE NEGATIVE mg/dL   Nitrite NEGATIVE NEGATIVE   Leukocytes,Ua NEGATIVE NEGATIVE   RBC / HPF 0-5 0 - 5 RBC/hpf   WBC, UA 0-5 0 - 5 WBC/hpf   Bacteria, UA NONE SEEN NONE SEEN   Squamous Epithelial / HPF 0-5 0 - 5 /HPF   Mucus PRESENT     Comment: Performed at [facility], [address] [place], [place] [address]  TSH     Status: Abnormal   Collection Time: 11/22/23 12:10 PM  Result Value Ref Range   TSH 39.047 (H) 0.350 - 4.500 uIU/mL    Comment: Performed by a 3rd Generation assay with a functional sensitivity of <=0.01 uIU/mL. Performed at [facility], [address] [place], [place] [address]   Basic metabolic panel with GFR     Status: Abnormal   Collection Time:  11/22/23 12:10 PM  Result Value Ref Range   Sodium 140 135 - 145 mmol/L   Potassium 4.2 3.5 - 5.1 mmol/L   Chloride 94 (L) 98 - 111 mmol/L   CO2 33 (H) 22 - 32 mmol/L   Glucose, Bld 129 (H) 70 - 99 mg/dL    Comment: Glucose reference range applies only to samples taken after fasting for at least 8 hours.   BUN 17 8 - 23 mg/dL   Creatinine, Ser 9.01 0.44 - 1.00 mg/dL   Calcium  9.3 8.9 - 10.3 mg/dL   GFR, Estimated >39 >39 mL/min    Comment: (NOTE) Calculated using the CKD-EPI Creatinine Equation (2021)    Anion gap 13 5 - 15    Comment: Performed at [facility], [address], [place], [place] [address]  B Nat Peptide     Status: None   Collection Time: 11/22/23 12:10 PM  Result Value Ref Range   B Natriuretic Peptide 13.6 0.0 - 100.0 pg/mL    Comment: Performed at [facility], [address], [place], [place] [address]  T4, free     Status: None   Collection Time: 11/22/23 12:10 PM  Result Value Ref Range   Free T4 0.78 0.61 - 1.12 ng/dL    Comment: (NOTE) Biotin ingestion may interfere with free T4 tests. If the results are inconsistent with the TSH level, previous test results, or the clinical presentation, then consider biotin interference. If needed, order repeat testing after stopping biotin. Performed at [facility], [address], [place], [place] [address]   Basic metabolic panel     Status: Abnormal   Collection Time: 11/29/23  3:47 PM  Result Value Ref Range   Sodium 147 (H) 135 - 145 mmol/L   Potassium 3.9 3.5 - 5.1 mmol/L   Chloride 104 98 - 111 mmol/L   CO2 29 22 - 32 mmol/L   Glucose, Bld 132 (H) 70 - 99 mg/dL    Comment: Glucose reference range applies only to samples taken after fasting for at least 8 hours.   BUN 16 8 - 23 mg/dL   Creatinine, Ser 8.93 (H) 0.44 - 1.00 mg/dL   Calcium  8.9 8.9 - 10.3 mg/dL   GFR, Estimated 59 (L) >60 mL/min    Comment: (NOTE)  Calculated using the CKD-EPI Creatinine Equation (2021)    Anion gap 14  5 - 15    Comment: Performed at [facility], [address] [place], [place] [address]  CBC     Status: Abnormal   Collection Time: 11/29/23  3:47 PM  Result Value Ref Range   WBC 12.4 (H) 4.0 - 10.5 K/uL   RBC 3.63 (L) 3.87 - 5.11 MIL/uL   Hemoglobin 11.0 (L) 12.0 - 15.0 g/dL   HCT 64.7 (L) 63.9 - 53.9 %   MCV 97.0 80.0 - 100.0 fL   MCH 30.3 26.0 - 34.0 pg   MCHC 31.3 30.0 - 36.0 g/dL   RDW 86.3 88.4 - 84.4 %   Platelets 287 150 - 400 K/uL   nRBC 0.0 0.0 - 0.2 %    Comment: Performed at [facility], [address], [place], [place] [address]  Troponin I (High Sensitivity)     Status: None   Collection Time: 11/29/23  3:47 PM  Result Value Ref Range   Troponin I (High Sensitivity) 5 <18 ng/L    Comment: (NOTE) Elevated high sensitivity troponin I (hsTnI) values and significant  changes across serial measurements may suggest ACS but many other  chronic and acute conditions are known to elevate hsTnI results.  Refer to the Links section for chest pain algorithms and additional  guidance. Performed at [facility], [address], [place], [place] [address]   Brain natriuretic peptide (order if patient c/o SOB ONLY)     Status: None   Collection Time: 11/29/23  4:18 PM  Result Value Ref Range   B Natriuretic Peptide 15.8 0.0 - 100.0 pg/mL    Comment: Performed at [facility], [address], [place], [place] [address]  D-dimer, quantitative     Status: None   Collection Time: 11/29/23  5:12 PM  Result Value Ref Range   D-Dimer, Quant <0.27 0.00 - 0.50 ug/mL-FEU    Comment: (NOTE) At the manufacturer cut-off value of 0.5 g/mL FEU, this assay has a negative predictive value of 95-100%.This assay is intended for use in conjunction with a clinical pretest probability (PTP) assessment model to exclude pulmonary embolism (PE) and deep venous thrombosis (DVT) in outpatients suspected of PE or DVT. Results should be correlated with clinical  presentation. Performed at [facility], [address] [place], [place] [address]   Troponin I (High Sensitivity)     Status: None   Collection Time: 11/29/23  5:47 PM  Result Value Ref Range   Troponin I (High Sensitivity) 6 <18 ng/L    Comment: (NOTE) Elevated high sensitivity troponin I (hsTnI) values and significant  changes across serial measurements may suggest ACS but many other  chronic and acute conditions are known to elevate hsTnI results.  Refer to the Links section for chest pain algorithms and additional  guidance. Performed at [facility], [address] [place], [place] [address]   Lipid panel     Status: Abnormal   Collection Time: 12/05/23 11:49 AM  Result Value Ref Range   Cholesterol 208 (H) 0 - 200 mg/dL    Comment: ATP III Classification       Desirable:  < 200 mg/dL               Borderline High:  200 - 239 mg/dL          High:  > = 759 mg/dL  Triglycerides 230.0 (H) 0.0 - 149.0 mg/dL    Comment: Normal:  <849 mg/dLBorderline High:  150 - 199 mg/dL   HDL 33.49 >60.99 mg/dL   VLDL 53.9 (H) 0.0 - 59.9 mg/dL   LDL Cholesterol 96 0 - 99 mg/dL   Total CHOL/HDL Ratio 3     Comment:                Men          Women1/2 Average Risk     3.4          3.3Average Risk          5.0          4.42X Average Risk          9.6          7.13X Average Risk          15.0          11.0                       NonHDL 141.67     Comment: NOTE:  Non-HDL goal should be 30 mg/dL higher than patient's LDL goal (i.e. LDL goal of < 70 mg/dL, would have non-HDL goal of < 100 mg/dL)  Uric acid     Status: None   Collection Time: 12/05/23 11:49 AM  Result Value Ref Range   Uric Acid, Serum 6.0 2.4 - 7.0 mg/dL  Microalbumin / creatinine urine ratio     Status: Abnormal   Collection Time: 12/05/23 11:49 AM  Result Value Ref Range   Microalb, Ur 5.8 (H) 0.0 - 1.9 mg/dL   Creatinine,U 37.3 mg/dL   Microalb Creat Ratio 92.4 (H) 0.0 - 30.0 mg/g  Hepatitis C Antibody      Status: None   Collection Time: 12/05/23 11:49 AM  Result Value Ref Range   Hepatitis C Ab NON-REACTIVE NON-REACTIVE    Comment: . HCV antibody was non-reactive. There is no laboratory  evidence of HCV infection. . In most cases, no further action is required. However, if recent HCV exposure is suspected, a test for HCV RNA (test code 64354) is suggested. . For additional information please refer to http://education.[facility].com/faq/FAQ22v1 (This link is being provided for informational/ educational purposes only.) .         Beverley Adine Hummer, MD, MS

## 2023-12-26 ENCOUNTER — Telehealth: Payer: Self-pay

## 2023-12-26 ENCOUNTER — Other Ambulatory Visit (HOSPITAL_COMMUNITY): Payer: Self-pay

## 2023-12-26 DIAGNOSIS — Z9981 Dependence on supplemental oxygen: Secondary | ICD-10-CM

## 2023-12-26 DIAGNOSIS — E662 Morbid (severe) obesity with alveolar hypoventilation: Secondary | ICD-10-CM

## 2023-12-26 MED ORDER — ALBUTEROL SULFATE HFA 108 (90 BASE) MCG/ACT IN AERS: 1.0000 | INHALATION_SPRAY | Freq: Four times a day (QID) | RESPIRATORY_TRACT | 1 refills | Status: AC | PRN

## 2023-12-26 MED ORDER — ACCU-CHEK GUIDE TEST VI STRP: ORAL_STRIP | 12 refills | Status: DC

## 2023-12-26 MED ORDER — FEROSUL 325 (65 FE) MG PO TABS: 325.0000 mg | ORAL_TABLET | Freq: Every day | ORAL | 11 refills | Status: AC

## 2023-12-26 MED ORDER — CARVEDILOL 6.25 MG PO TABS: 6.2500 mg | ORAL_TABLET | Freq: Two times a day (BID) | ORAL | 11 refills | Status: AC

## 2023-12-26 MED ORDER — DICYCLOMINE HCL 20 MG PO TABS: 20.0000 mg | ORAL_TABLET | Freq: Two times a day (BID) | ORAL | 0 refills | Status: DC

## 2023-12-26 MED ORDER — ACCU-CHEK SOFTCLIX LANCETS MISC: 12 refills | Status: DC

## 2023-12-26 MED ORDER — FENOFIBRATE 145 MG PO TABS: 145.0000 mg | ORAL_TABLET | Freq: Every day | ORAL | 11 refills | Status: AC

## 2023-12-26 MED ORDER — ONDANSETRON 4 MG PO TBDP: 4.0000 mg | ORAL_TABLET | Freq: Three times a day (TID) | ORAL | 0 refills | Status: AC | PRN

## 2023-12-26 NOTE — Progress Notes (Signed)
   12/26/2023  Patient ID: Michele Owens, female   DOB: Jun 03, 1960, 62 y.o.   MRN: [id number]  [facility], and they state [name] was previously enrolled in bubble packs; but the pharmacy was having a hard time getting everything aligned to be filled at the same time due to receiving different medications from different prescribers.  They state that if we can fax her current medication list along with how medications and being taken, and if Dr. Sebastian can send new orders for fenofibrate , ferrous sulfate , dicyclomine , carvedilol , test strips, lancets, and albuterol  they can get her set back up for bubble packs.  These medications currently are prescribed by other providers.  Orders pending for Dr. Sebastian to sign if in agreement, and I am faxing a list of patient's current medications and how these are taken to the pharmacy.  Channing DELENA Mealing, PharmD, DPLA

## 2023-12-26 NOTE — Telephone Encounter (Signed)
 Patient returned phone call and I informed her that walking test for oxygen  is needed via request from [employer]. Pt verbalized understanding and nurse visit was scheduled.

## 2023-12-26 NOTE — Telephone Encounter (Signed)
 Pharmacy Patient Advocate Encounter   Received notification from Onbase that prior authorization for Nurtec 75MG  dispersible tablets  is required/requested.   Insurance verification completed.   The patient is insured through [facility] MEDICAID .   Per test claim: PA required; PA submitted to above mentioned insurance via Latent Key/confirmation #/EOC [id number] Status is pending

## 2023-12-26 NOTE — Telephone Encounter (Signed)
 Forwarding message below; testing wasn't performed on OV. We will have to complete on upcoming appointment on 01/11/2024. I will call and inform patient

## 2023-12-26 NOTE — Telephone Encounter (Signed)
 Noted

## 2023-12-26 NOTE — Telephone Encounter (Signed)
 Nurse visit scheduled.

## 2023-12-26 NOTE — Telephone Encounter (Signed)
 Copied from CRM [id number]. Topic: General - Other >> Dec 26, 2023 10:10 AM [place] E wrote: Reason for CRM: Dewayne from [facility] called in stating that they received an order for oxygen  for this patient, but they do not have a walk test or room-air test on file, Dewayne stated they have one from 2024, but the patient did not qualify then so they need new testing to be done, at least for one of the two tests. Callback number for Dewayne [phone].

## 2023-12-26 NOTE — Telephone Encounter (Signed)
 Can we start/submit prior authorization for  Rimegepant Sulfate (NURTEC) 75 MG TBDP

## 2023-12-26 NOTE — Telephone Encounter (Signed)
 Called patient to inform and she stated she will have to call back.

## 2023-12-27 ENCOUNTER — Telehealth: Payer: Self-pay | Admitting: Podiatry

## 2023-12-27 ENCOUNTER — Other Ambulatory Visit

## 2023-12-27 ENCOUNTER — Encounter: Payer: Self-pay | Admitting: Neurology

## 2023-12-27 ENCOUNTER — Telehealth: Payer: Self-pay

## 2023-12-27 NOTE — Telephone Encounter (Signed)
 Copied from CRM #[id number]. Topic: Clinical - Medication Prior Auth >> Dec 26, 2023  1:35 PM Mesmerise C wrote: Reason for CRM: [place] from [facility] advising they received the prior authorization for the Nurtec just need additional information can be reached at [phone] also stated they will be faxing over the additonal information they need as well

## 2023-12-27 NOTE — Telephone Encounter (Signed)
 Forwarding message below. Please advise.

## 2023-12-27 NOTE — Telephone Encounter (Deleted)
 Patient called last seen in our office om 08/16/2020 by Dr. Silva. An referral from [facility] was sent 12/25/2023. The referral is marked Denied. When asking the patient  what she needed to be seen for, she stated for diabetic shoes. As tried to explain our location status on diabetic shoes, she kept cutting me off the ended up stating some is lying, we need to cal her doctor to tell them because she has too much on her right now then started crying. I informed her that I will send a message to referral department and supervisor to see what else can be done.

## 2023-12-27 NOTE — Telephone Encounter (Signed)
 Noted, appeal is needed

## 2023-12-27 NOTE — Telephone Encounter (Signed)
 This patient has been dismissed from our practice. We are not to see her with any of our providers!

## 2023-12-27 NOTE — Telephone Encounter (Signed)
 Hello,     Our Team has received A denial for ''Nurtec''. Insurance requires documentation of trialed and failed Triptan Therapy if any. If triptans are contraindicated then could we please update chart notes to reflect as well as how many headache.migraine days per month. Please advise.    Please see Denial below.

## 2023-12-27 NOTE — Telephone Encounter (Signed)
 Called center well to provide information that is needed for PA and was unable to achieve due to incorrect call back number that was provided. I was make a second attempt in finding the correct source and contact information.

## 2023-12-27 NOTE — Telephone Encounter (Signed)
 Update: Chart closed by a department at [facility] before completion:   Patient called. Last seen in our office on 08/16/2020 by Dr. Silva. A referral from [facility] was sent on 12/25/2023 but is marked Denied. When asked about the reason for the visit, the patient stated it was for diabetic shoes. While explaining our current status regarding diabetic shoes, the patient repeatedly interrupted, eventually stating that "someone is lying" and insisting we contact her doctor, expressing she is overwhelmed and then became emotional and started crying. I informed her that I will send a message to the referral department and supervisor to explore further options.

## 2023-12-27 NOTE — Telephone Encounter (Signed)
 Copied from CRM #[id number]. Topic: Clinical - Prescription Issue >> Dec 27, 2023  1:23 PM Mercedes MATSU wrote: Reason for CRM: Patient stated that the medication that she is taking is making her very nauseous trazodone  (DESYREL ) 300 MG tablet and it makes her very dizzy. Patient is requesting a call back at [phone].

## 2023-12-27 NOTE — Telephone Encounter (Signed)
 Hi,    Please see my encounter from yesterday. This request for ''nurtec'' has been denied. I have fwd'd the denial to you in my encounter. We would need an addendum to chartnotes if possible.

## 2023-12-27 NOTE — Telephone Encounter (Signed)
 Dismissal from [facility] letter sent 08/24/2020 and has been noted to account and blue Podiatry Note Please disregard 12/27/2023 encounter. Thank you

## 2023-12-28 NOTE — Telephone Encounter (Signed)
 Copied from CRM [id number]. Topic: Clinical - Medication Prior Auth >> Dec 27, 2023  3:15 PM Franky GRADE wrote: Reason for CRM: Pharmacy is calling to advise that Rimegepant Sulfate (NURTEC) 75 MG TBDP [[id number]] requires Prior Auth. There are also other medications that alternative medications may not requires prior Auth and or be covered by patient's insurance.

## 2023-12-28 NOTE — Telephone Encounter (Signed)
 Please see message below

## 2023-12-29 ENCOUNTER — Encounter (HOSPITAL_COMMUNITY): Payer: Self-pay

## 2023-12-29 ENCOUNTER — Emergency Department (HOSPITAL_COMMUNITY)

## 2023-12-29 ENCOUNTER — Emergency Department (HOSPITAL_COMMUNITY)
Admission: EM | Admit: 2023-12-29 | Discharge: 2023-12-30 | Disposition: A | Discharge status: Home / Self Care | Attending: Emergency Medicine | Admitting: Emergency Medicine

## 2023-12-29 ENCOUNTER — Other Ambulatory Visit: Payer: Self-pay

## 2023-12-29 DIAGNOSIS — Z87891 Personal history of nicotine dependence: Secondary | ICD-10-CM | POA: Diagnosis not present

## 2023-12-29 DIAGNOSIS — Z7982 Long term (current) use of aspirin: Secondary | ICD-10-CM | POA: Diagnosis not present

## 2023-12-29 DIAGNOSIS — R519 Headache, unspecified: Secondary | ICD-10-CM | POA: Insufficient documentation

## 2023-12-29 DIAGNOSIS — I10 Essential (primary) hypertension: Secondary | ICD-10-CM | POA: Diagnosis not present

## 2023-12-29 DIAGNOSIS — Z79899 Other long term (current) drug therapy: Secondary | ICD-10-CM | POA: Diagnosis not present

## 2023-12-29 DIAGNOSIS — D72829 Elevated white blood cell count, unspecified: Secondary | ICD-10-CM | POA: Diagnosis not present

## 2023-12-29 LAB — CBC WITH DIFFERENTIAL/PLATELET
Abs Immature Granulocytes: 0.04 K/uL (ref 0.00–0.07)
Basophils Absolute: 0.1 K/uL (ref 0.0–0.1)
Basophils Relative: 1 %
Eosinophils Absolute: 0.2 K/uL (ref 0.0–0.5)
Eosinophils Relative: 2 %
HCT: 42.4 % (ref 36.0–46.0)
Hemoglobin: 12.9 g/dL (ref 12.0–15.0)
Immature Granulocytes: 0 %
Lymphocytes Relative: 16 %
Lymphs Abs: 1.7 K/uL (ref 0.7–4.0)
MCH: 29.9 pg (ref 26.0–34.0)
MCHC: 30.4 g/dL (ref 30.0–36.0)
MCV: 98.1 fL (ref 80.0–100.0)
Monocytes Absolute: 0.6 K/uL (ref 0.1–1.0)
Monocytes Relative: 5 %
Neutro Abs: 8.1 K/uL — ABNORMAL HIGH (ref 1.7–7.7)
Neutrophils Relative %: 76 %
Platelets: 282 K/uL (ref 150–400)
RBC: 4.32 MIL/uL (ref 3.87–5.11)
RDW: 13.4 % (ref 11.5–15.5)
WBC: 10.6 K/uL — ABNORMAL HIGH (ref 4.0–10.5)
nRBC: 0 % (ref 0.0–0.2)

## 2023-12-29 LAB — COMPREHENSIVE METABOLIC PANEL WITH GFR
ALT: 20 U/L (ref 0–44)
AST: 20 U/L (ref 15–41)
Albumin: 3.9 g/dL (ref 3.5–5.0)
Alkaline Phosphatase: 58 U/L (ref 38–126)
Anion gap: 13 (ref 5–15)
BUN: 12 mg/dL (ref 8–23)
CO2: 32 mmol/L (ref 22–32)
Calcium: 8.9 mg/dL (ref 8.9–10.3)
Chloride: 96 mmol/L — ABNORMAL LOW (ref 98–111)
Creatinine, Ser: 0.83 mg/dL (ref 0.44–1.00)
GFR, Estimated: >60 mL/min (ref >60)
Glucose, Bld: 115 mg/dL — ABNORMAL HIGH (ref 70–99)
Potassium: 3.9 mmol/L (ref 3.5–5.1)
Sodium: 141 mmol/L (ref 135–145)
Total Bilirubin: 0.7 mg/dL (ref 0.0–1.2)
Total Protein: 7.1 g/dL (ref 6.5–8.1)

## 2023-12-29 MED ORDER — DIPHENHYDRAMINE HCL 50 MG/ML IJ SOLN
25.0000 mg | Freq: Once | INTRAMUSCULAR | Status: AC
  Administered 2023-12-29: 25 mg via INTRAMUSCULAR
  Filled 2023-12-29: qty 1

## 2023-12-29 MED ORDER — LORAZEPAM 2 MG/ML IJ SOLN: 1.0000 mg | Freq: Once | INTRAMUSCULAR | Status: DC

## 2023-12-29 MED ORDER — DROPERIDOL 2.5 MG/ML IJ SOLN: 1.2500 mg | Freq: Once | INTRAMUSCULAR | Status: DC

## 2023-12-29 MED ORDER — DROPERIDOL 2.5 MG/ML IJ SOLN
2.5000 mg | Freq: Once | INTRAMUSCULAR | Status: AC
  Administered 2023-12-29: 2.5 mg via INTRAMUSCULAR
  Filled 2023-12-29: qty 2

## 2023-12-29 MED ORDER — LORAZEPAM 2 MG/ML IJ SOLN
2.0000 mg | Freq: Once | INTRAMUSCULAR | Status: AC
  Administered 2023-12-29: 2 mg via INTRAMUSCULAR
  Filled 2023-12-29: qty 1

## 2023-12-29 MED ORDER — DIPHENHYDRAMINE HCL 50 MG/ML IJ SOLN: 12.5000 mg | Freq: Once | INTRAMUSCULAR | Status: DC

## 2023-12-29 NOTE — ED Triage Notes (Signed)
 Pt arrives via EMS from home with migraines for 2 days with light sensitivity and dizziness. Meds caused n/v so pt no longer takes them. Pt on home 4L [place].

## 2023-12-29 NOTE — ED Notes (Signed)
 Pt took off pulse ox, bp cuff, and 5 lead heart monitor. Pt states she will not stay on monitor any longer

## 2023-12-29 NOTE — ED Provider Triage Note (Signed)
 Emergency Medicine Provider Triage Evaluation Note  Makenzee Choudhry , a 63 y.o. female  was evaluated in triage.  Pt complains of migraine that has been going on for years, but has gotten worse acutely. States that it did feel like a sudden onset headache and she does appreciate some visual changes including light sensitivity as well as lightheadedness.  She also states that the headache does wake her up out of her sleep.  Denies unilateral limb weakness, slurred speech, facial droop.  In triage patient also complaining of chronic left knee pain as well as other chronic complaints and wanting medication changes.  Denies acute chest pain, shortness of breath, abdominal pain, nausea, vomiting.  Review of Systems  Positive: Headache/migraine, visual disturbances Negative: Chest pain, shortness of breath, abdominal pain, nausea, vomiting, unilateral weakness, facial droop, slurred speech  Physical Exam  BP 130/76 (BP Location: Right Arm)   Pulse 80   Temp 98.6 F (37 C) (Oral)   Resp (!) 21   Ht 5' 5 (1.651 m)   Wt (!) 142.4 kg   SpO2 96%   BMI 52.25 kg/m  Gen:   Awake, no distress   Resp:  Normal effort  MSK:   Moves extremities without difficulty  Other:  Patient on nasal cannula oxygen  which she is on at baseline, patient appears overall well-appearing, patient alert and oriented, no issues with coordination, no unilateral weakness, no pronator drift, vision grossly intact  Medical Decision Making  Medically screening exam initiated at 3:00 PM.  Appropriate orders placed.  [name] was informed that the remainder of the evaluation will be completed by another provider, this initial triage assessment does not replace that evaluation, and the importance of remaining in the ED until their evaluation is complete.  Orders: CBC, CMP, CT head  I do not believe this to be a code stroke at this time as the patient has no facial droop, slurred speech, unilateral weakness, only  complaining of headache/migraine which patient has a history of, very low clinical suspicion for LVO   Janetta Terrall FALCON, PA-C 12/29/23 1513

## 2023-12-29 NOTE — ED Notes (Signed)
 Ptar called

## 2023-12-29 NOTE — ED Notes (Signed)
Pt being assisted to bathroom.

## 2023-12-29 NOTE — ED Provider Notes (Signed)
 [place] EMERGENCY DEPARTMENT AT [facility] Provider Note   CSN: [id number] Arrival date & time: 12/29/23  1357     Patient presents with: Migraine   Michele Owens is a 63 y.o. female with who presents emergency department for chief complaint of migraine headache.  The patient reports a history of migraine headaches.  She denies having ever seen a neurologist and states that her kidney doctor gave her this diagnosis.  She has previously tried on Nurtec but took herself off of the medication.  She reports that she started having nausea when she was started on trazodone  2 months ago so also removed herself from that.  She has had a headache for a month with associated blurry vision.  She says over the past week it has gotten much worse.  Her vision has stayed the same.  She has light and sound sensitivity.  She describes the headache as global and throbbing.  She denies any nausea or vomiting since discontinuing her trazodone  several days ago.    Migraine       Prior to Admission medications   Medication Sig Start Date End Date Taking? Authorizing Provider  Accu-Chek Softclix Lancets lancets Use to check blood sugar 3 times daily 12/26/23   [name], MD  ACETAMINOPHEN  PO Take 1,300-2,050 mg by mouth daily as needed for moderate pain (pain score 4-6) or headache.    [provider]  albuterol  (VENTOLIN  HFA) 108 (90 Base) MCG/ACT inhaler Inhale 1-2 puffs into the lungs every 6 (six) hours as needed for wheezing or shortness of breath. 12/26/23   [name], MD  allopurinol  (ZYLOPRIM ) 100 MG tablet Take 1 tablet (100 mg total) by mouth daily. 11/13/23 11/07/24  [name], MD  aspirin  EC 81 MG tablet Take 1 tablet (81 mg total) by mouth daily. Swallow whole. 11/13/23 11/07/24  [name], MD  blood glucose meter kit and supplies KIT Dispense based on patient and insurance preference. Use up to four times daily as directed. 12/25/20   Lue Elsie BROCKS, MD  Blood Glucose Monitoring Suppl DEVI 1 each by Does not apply route in the morning, at noon, and at bedtime. May substitute to any manufacturer covered by patient's insurance. 11/13/23   [name], MD  budesonide  (PULMICORT ) 0.25 MG/2ML nebulizer solution Inhale 2 mLs (0.25 mg total) by nebulization 2 (two) times daily. 11/13/23   [name], MD  busPIRone  (BUSPAR ) 30 MG tablet Take 1 tablet (30 mg total) by mouth 2 (two) times daily. 12/05/23 11/29/24  [name], MD  carvedilol  (COREG ) 6.25 MG tablet Take 1 tablet (6.25 mg total) by mouth 2 (two) times daily. 12/26/23   [name], MD  clotrimazole  (LOTRIMIN ) 1 % cream Apply topically 2 (two) times daily. 12/13/23   [name], MD  diclofenac  Sodium (VOLTAREN ) 1 % GEL Apply 4 g topically 4 (four) times daily. 12/15/23   Freddi Hamilton, MD  dicyclomine  (BENTYL ) 20 MG tablet Take 1 tablet (20 mg total) by mouth 2 (two) times daily. 12/26/23   [name], MD  EPINEPHrine  0.3 mg/0.3 mL IJ SOAJ injection Inject 0.3 mg into the muscle once as needed for anaphylaxis.    [provider]  FARXIGA  10 MG TABS tablet Take 1 tablet (10 mg total) by mouth daily before breakfast. 11/06/23   [name], MD  fenofibrate  (TRICOR ) 145 MG tablet Take 1 tablet (145 mg total) by mouth daily. 12/26/23  [name], MD  FEROSUL 325 (65 Fe) MG tablet Take 1 tablet (325 mg total) by mouth daily. 12/26/23   [name], MD  gabapentin (NEURONTIN) 100 MG capsule Take 1 capsule 3 times a day by oral route as needed for 30 days. 12/06/23   [provider]  glucose blood (ACCU-CHEK GUIDE TEST) test strip Use to check blood sugar 3 times daily 12/26/23   [name], MD  levocetirizine (XYZAL ) 5 MG tablet TAKE 1 TABLET (5 MG TOTAL) BY MOUTH AT BEDTIME. 12/17/23   [name], MD  levothyroxine  (SYNTHROID ) 125 MCG tablet Take 1 tablet (125 mcg total) by mouth at bedtime. 11/06/23   [name], MD  losartan  (COZAAR ) 25 MG tablet Take 1 tablet (25 mg total) by mouth daily. 11/13/23   [name], MD  lurasidone  (LATUDA ) 20 MG TABS tablet Take 1 tablet (20 mg total) by mouth at bedtime. Take with a snack. This is for mood and sleep. 12/25/23 12/19/24  [name], MD  nitroGLYCERIN  (NITROSTAT ) 0.4 MG SL tablet Place 1 tablet (0.4 mg total) under the tongue every 5 (five) minutes as needed for chest pain. 12/25/23   [name], MD  omeprazole  (PRILOSEC) 20 MG capsule Take 2 capsules (40 mg total) by mouth daily. 11/06/23   [name], MD  ondansetron  (ZOFRAN -ODT) 4 MG disintegrating tablet Take 1 tablet (4 mg total) by mouth every 8 (eight) hours as needed for nausea or vomiting. 12/26/23   [name], MD  OXYGEN  Inhale 2 L into the lungs continuous.    [provider]  potassium chloride  (KLOR-CON ) 10 MEQ tablet Take 1 tablet (10 mEq total) by mouth 2 (two) times daily. 11/13/23   [name], MD  Rimegepant Sulfate (NURTEC) 75 MG TBDP Take 1 tablet (75 mg total) by mouth every other day. Prevents and Treats Migraine Headaches Patient not taking: Reported on 12/26/2023 12/25/23 12/19/24  [name], MD  rosuvastatin  (CRESTOR ) 10 MG tablet Take 1 tablet (10 mg total) by mouth every evening. 11/13/23   [name], MD  torsemide  (DEMADEX ) 20 MG tablet Take 2 tablets (40 mg total) by mouth 2 (two) times daily. 11/13/23   [name], MD  traZODone  (DESYREL ) 300 MG tablet Take 1 tablet (300 mg total) by mouth at bedtime. 12/05/23 11/29/24  [name], MD  umeclidinium-vilanterol (ANORO ELLIPTA ) 62.5-25 MCG/ACT AEPB Inhale 1 puff into the lungs daily. 12/13/23   [name], MD  Vitamin D , Ergocalciferol , (DRISDOL ) 1.25 MG (50000 UNIT) CAPS capsule Take 1 capsule (50,000 Units total) by mouth every 7 (seven) days. 11/13/23   [name], MD    Allergies: Bee venom, Fioricet  [butalbital -apap-caffeine ], Ibuprofen , Lamisil   [terbinafine ], and Nsaids    Review of Systems  Updated Vital Signs BP 116/72   Pulse 85   Temp 98.7 F (37.1 C) (Oral)   Resp 18   Ht 5' 5 (1.651 m)   Wt (!) 142.4 kg   SpO2 98%   BMI 52.25 kg/m   Physical Exam Vitals and nursing note reviewed.  Constitutional:      General: She is not in acute distress.    Appearance: She is well-developed. She is not diaphoretic.  HENT:     Head: Normocephalic and atraumatic.     Right Ear: External ear normal.     Left Ear: External ear normal.     Mouth/Throat:  Pharynx: No oropharyngeal exudate.  Eyes:     General: Lids are normal. Vision grossly intact. No scleral icterus.    Extraocular Movements: Extraocular movements intact.     Conjunctiva/sclera: Conjunctivae normal.     Pupils: Pupils are equal, round, and reactive to light.     Visual Fields: Right eye visual fields normal and left eye visual fields normal.  Neck:     Thyroid : No thyromegaly.     Vascular: No JVD.     Comments: No meningismus Cardiovascular:     Rate and Rhythm: Normal rate and regular rhythm.     Heart sounds: Normal heart sounds. No murmur heard.    No friction rub. No gallop.  Pulmonary:     Effort: Pulmonary effort is normal. No respiratory distress.     Breath sounds: Normal breath sounds.  Abdominal:     General: Bowel sounds are normal. There is no distension.     Palpations: Abdomen is soft. There is no mass.     Tenderness: There is no abdominal tenderness. There is no guarding.  Musculoskeletal:        General: No tenderness. Normal range of motion.     Cervical back: Normal range of motion and neck supple.     Comments: No meningismus  Skin:    General: Skin is warm and dry.     Findings: No rash.  Neurological:     Mental Status: She is alert and oriented to person, place, and time.     Cranial Nerves: No cranial nerve deficit.     Coordination: Coordination normal.     Deep Tendon Reflexes: Reflexes are normal and symmetric.      Comments: Speech is clear and goal oriented, follows commands Major Cranial nerves without deficit, no facial droop Normal strength in upper and lower extremities bilaterally including dorsiflexion and plantar flexion, strong and equal grip strength Sensation normal to light and sharp touch Moves extremities without ataxia, coordination intact Normal finger to nose and rapid alternating movements Neg romberg, no pronator drift Normal gait Normal heel-shin and balance   Psychiatric:        Behavior: Behavior normal.     (all labs ordered are listed, but only abnormal results are displayed) Labs Reviewed  CBC WITH DIFFERENTIAL/PLATELET - Abnormal; Notable for the following components:      Result Value   WBC 10.6 (*)    Neutro Abs 8.1 (*)    All other components within normal limits  COMPREHENSIVE METABOLIC PANEL WITH GFR - Abnormal; Notable for the following components:   Chloride 96 (*)    Glucose, Bld 115 (*)    All other components within normal limits    EKG: None  Radiology: CT Head Wo Contrast Result Date: 12/29/2023 CLINICAL DATA:  sudden onset headache EXAM: CT HEAD WITHOUT CONTRAST TECHNIQUE: Contiguous axial images were obtained from the base of the skull through the vertex without intravenous contrast. RADIATION DOSE REDUCTION: This exam was performed according to the departmental dose-optimization program which includes automated exposure control, adjustment of the mA and/or kV according to patient size and/or use of iterative reconstruction technique. COMPARISON:  None Available. FINDINGS: Brain: No evidence of acute infarction, hemorrhage, hydrocephalus, extra-axial collection or mass lesion/mass effect. Vascular: No hyperdense vessel or unexpected calcification. Skull: Normal. Negative for fracture or focal lesion. Sinuses/Orbits: No acute finding. IMPRESSION: No evidence of acute intracranial abnormality. Electronically Signed   By: Gilmore GORMAN Molt M.D.   On:  12/29/2023 16:23  Procedures   Medications Ordered in the ED  droperidol  (INAPSINE ) 2.5 MG/ML injection 2.5 mg (2.5 mg Intramuscular Given 12/29/23 1712)  LORazepam  (ATIVAN ) injection 2 mg (2 mg Intramuscular Given 12/29/23 1711)  diphenhydrAMINE  (BENADRYL ) injection 25 mg (25 mg Intramuscular Given 12/29/23 1711)    Clinical Course as of 12/29/23 1851  Sat Dec 29, 2023  1754 Comprehensive metabolic panel(!) [AH]  1754 CBC with Differential(!) [AH]  [id number] Patient states that she is feeling better and would like to be discharged.  [AH]    Clinical Course User Index [AH] Arloa Chroman, PA-C                                 Medical Decision Making Amount and/or Complexity of Data Reviewed Labs:  Decision-making details documented in ED Course.  Risk Prescription drug management.  This patient presents to the ED for concern of HA, this involves an extensive number of treatment options, and is a complaint that carries with it a high risk of complications and morbidity.  Emergent considerations for headache include subarachnoid hemorrhage, meningitis, temporal arteritis, glaucoma, cerebral ischemia, carotid/vertebral dissection, intracranial tumor, Venous sinus thrombosis, carbon monoxide poisoning, acute or chronic subdural hemorrhage.  Other considerations include: Migraine, Cluster headache, Hypertension, Caffeine , alcohol, or drug withdrawal, Pseudotumor cerebri, Arteriovenous malformation, Head injury, Neurocysticercosis, Post-lumbar puncture, Preeclampsia, Tension headache, Sinusitis, Cervical arthritis, Refractive error causing strain, Dental abscess, Otitis media, Temporomandibular joint syndrome, Depression, Somatoform disorder (eg, somatization) Trigeminal neuralgia, Glossopharyngeal neuralgia.   Co morbidities:  obesity, obesity hypoventilation, smoking, pulmonary hypertension, chronic hypoxic respiratory failure, OSA  Social Determinants of Health:   SDOH Screenings    Food Insecurity: No Food Insecurity (09/09/2023)  Housing: Low Risk  (09/09/2023)  Transportation Needs: No Transportation Needs (09/09/2023)  Utilities: Not At Risk (09/09/2023)  Alcohol Screen: Low Risk  (10/24/2022)  Depression (PHQ2-9): Medium Risk (12/25/2023)  Financial Resource Strain: Low Risk  (10/24/2022)  Physical Activity: Inactive (02/01/2021)  Social Connections: Unknown (08/21/2021)   Received from [facility]  Stress: Stress Concern Present (02/23/2021)  Tobacco Use: Medium Risk (12/29/2023)     Additional history:  Review of EMR   Lab Tests:  I Ordered, and personally interpreted labs.  The pertinent results include:     labs reviewed no significant findings mildly elevated white blood cell count of insignificant value Imaging Studies:  I ordered imaging studies including CT head I independently visualized and interpreted imaging which showed no acute findings including no ventriculomegaly or empty sella Turcica suggestive of IIH I agree with the radiologist interpretation  Cardiac Monitoring/ECG:  The patient was maintained on a cardiac monitor.  I personally viewed and interpreted the cardiac monitored which showed an underlying rhythm of:   Medicines ordered and prescription drug management:  I ordered medication including  Medications  droperidol  (INAPSINE ) 2.5 MG/ML injection 2.5 mg (2.5 mg Intramuscular Given 12/29/23 1712)  LORazepam  (ATIVAN ) injection 2 mg (2 mg Intramuscular Given 12/29/23 1711)  diphenhydrAMINE  (BENADRYL ) injection 25 mg (25 mg Intramuscular Given 12/29/23 1711)   for Reevaluation of the patient after these medicines showed that the patient resolved I have reviewed the patients home medicines and have made adjustments as needed  Test Considered:   I considered MRI and lumbar puncture for opening pressure however these can be done in the outpatient setting with neurology  Critical Interventions:    Consultations  Obtained:   Problem List / ED Course:  ICD-10-CM   1. Bad headache  R51.9 Ambulatory referral to Neurology      MDM: 63 year old female with chronic headache she has multiple risk factors for chronic headache including body habitus with reasonable suspicion for IIH with her blurry vision she could have bad migraine she also has obstructive sleep apnea and seems to have very poor insight into her disease process and medications so certainly being noncompliant with her CPAP could be contributing to her headache.  She does not have any objective neurologic deficits on exam and normal range of motion with her neck she is afebrile I think she can follow outpatient with neurology for further evaluation she appears otherwise appropriate for discharge at this time.  She is currently asking for discharge.   Dispostion:  After consideration of the diagnostic results and the patients response to treatment, I feel that the patent would benefit from discharge.      Final diagnoses:  Bad headache    ED Discharge Orders          Ordered    Ambulatory referral to Neurology       Comments: An appointment is requested in approximately: 8 weeks   12/29/23 1801               Arloa Chroman, PA-C 12/29/23 1851    Doretha Folks, MD 12/29/23 2324

## 2023-12-29 NOTE — Discharge Instructions (Signed)
 You were seen in the emergency department and treated for your chronic headache with migraine treatment medications.  Your CT scan did not show any abnormalities at this time however it is very importantly follow-up with a neurologist for further management and evaluation of your bad headache with blurry vision.  There are multiple other causes including your history of sleep apnea, some of the medications that you take as well as your chronic respiratory failure with need for oxygen  which could be contributing to this headache you are having.  Please see your primary care doctor and the neurologist as soon as possible.  You should be contacted by neurology for an appointment. You are having a headache. No specific cause was found today for your headache. It may have been a migraine or other cause of headache. Stress, anxiety, fatigue, and depression are common triggers for headaches. Your headache today does not appear to be life-threatening or require hospitalization, but often the exact cause of headaches is not determined in the emergency department. Therefore, follow-up with your doctor is very important to find out what may have caused your headache, and whether or not you need any further diagnostic testing or treatment. Sometimes headaches can appear benign (not harmful), but then more serious symptoms can develop which should prompt an immediate re-evaluation by your doctor or the emergency department. SEEK MEDICAL ATTENTION IF: You develop possible problems with medications prescribed.  The medications don't resolve your headache, if it recurs , or if you have multiple episodes of vomiting or can't take fluids. You have a change from the usual headache. RETURN IMMEDIATELY IF you develop a sudden, severe headache or confusion, become poorly responsive or faint, develop a fever above 100.27F or problem breathing, have a change in speech, vision, swallowing, or understanding, or develop new weakness,  numbness, tingling, incoordination, or have a seizure.

## 2023-12-31 ENCOUNTER — Telehealth: Payer: Self-pay

## 2023-12-31 ENCOUNTER — Other Ambulatory Visit: Payer: Self-pay

## 2023-12-31 DIAGNOSIS — G43E09 Chronic migraine with aura, not intractable, without status migrainosus: Secondary | ICD-10-CM

## 2023-12-31 NOTE — Telephone Encounter (Unsigned)
 Copied from CRM [id number]. Topic: Clinical - Medication Question >> Dec 31, 2023  1:42 PM [name] I wrote: Reason for CRM: Patient states that she never received her migraine or sleeping medication and Dr. Sebastian needs to contact Dr. JAYSON the dialysis doctor about migraine medication. Patient would also like a callback to discuss today.

## 2023-12-31 NOTE — Progress Notes (Signed)
   12/31/2023  Patient ID: Michele Owens, female   DOB: February 01, 1961, 63 y.o.   MRN: [id number]  Returning missed call/voicemail from pharmacist at [facility].  Nurtec requires a prior authorization, which has already been initiated.  Upon reviewing chart notes, this PA has been denied; because Medicaid requires patient's first try/fail sumatriptan and rizatriptan unless these medications are contraindicated.  I do not see that patient has tried either of these before, but triptans used along with trazodone  and buspirone  could increase the risk of serotonin syndrome.  Nurtec does not carry this risk, but it is contraindicated with patient's current use of carvedilol  due to potential increased serum concentrations of Nurtec.  I would recommend trial of rizatriptan ODT.  Chart notes also reflect patient recently contacted office because she states trazodone  is making her nauseas.  I would recommend slowly weaning trazodone  dose based on this and possible initiation of triptan for acute migraine treatment.  Channing DELENA Mealing, PharmD, DPLA

## 2023-12-31 NOTE — Telephone Encounter (Unsigned)
 Copied from CRM #[id number]. Topic: Clinical - Lab/Test Results >> Dec 31, 2023  1:37 PM [name] I wrote: Reason for CRM: Patient would like a callback today regarding scans that were taken at the hospital on 9/20 to go over them as nobody told her about her situation while she was there.

## 2024-01-01 ENCOUNTER — Ambulatory Visit

## 2024-01-01 IMAGING — DX DG KNEE COMPLETE 4+V*L*
1 series · 4 of 4 positions shown · non-contrast
Comparison: None.

CLINICAL DATA: Knee pain.

EXAM:
LEFT KNEE - COMPLETE 4+ VIEW

[Series 1: knee · 0.14mm/px · 4 of 4 slices shown]
[im 1/4]
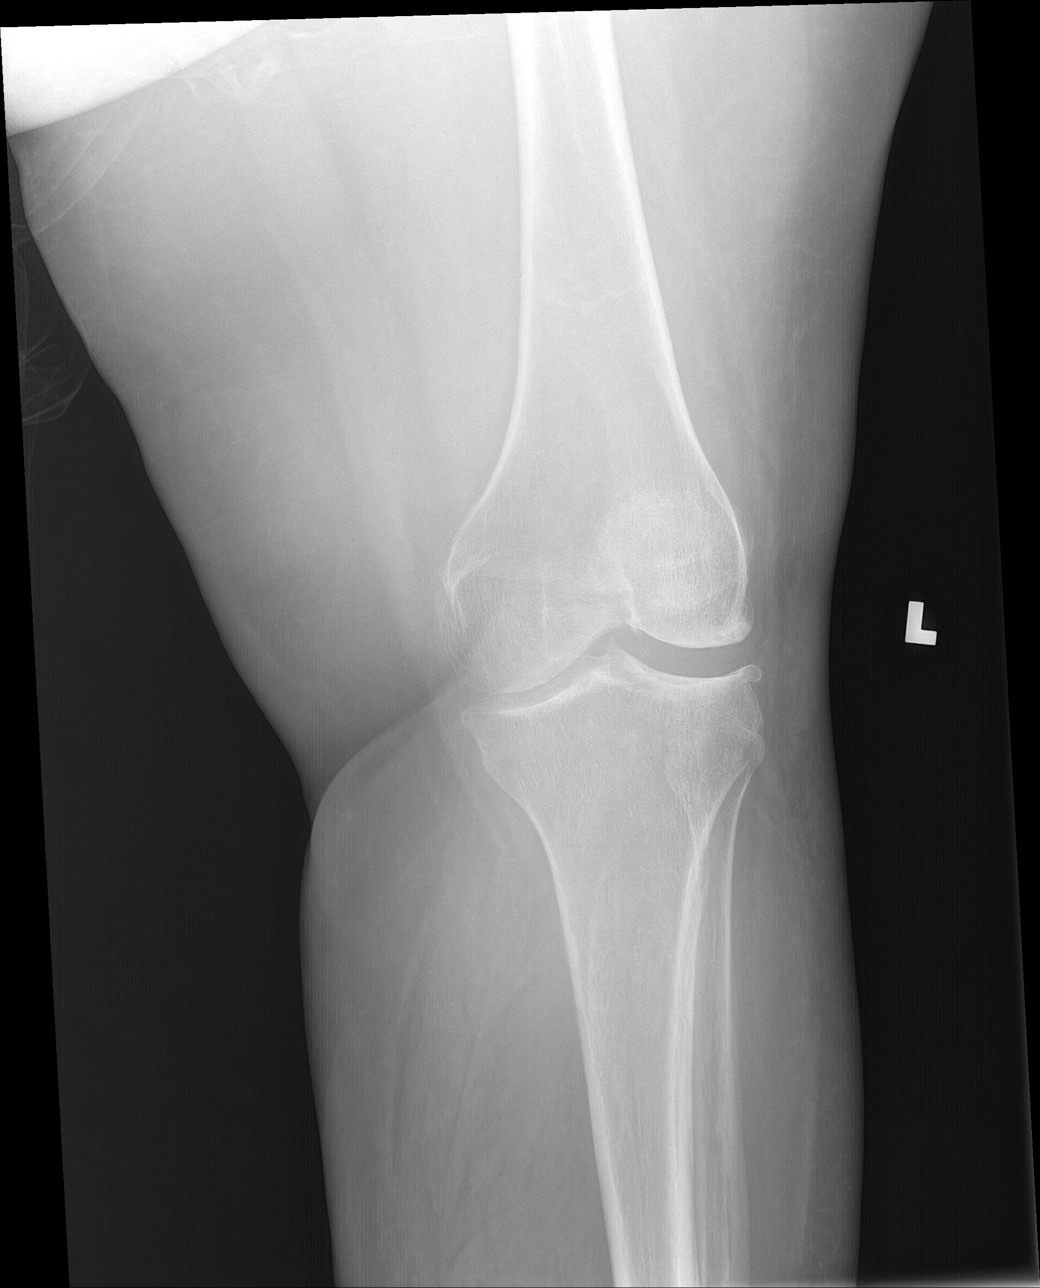
[im 2/4]
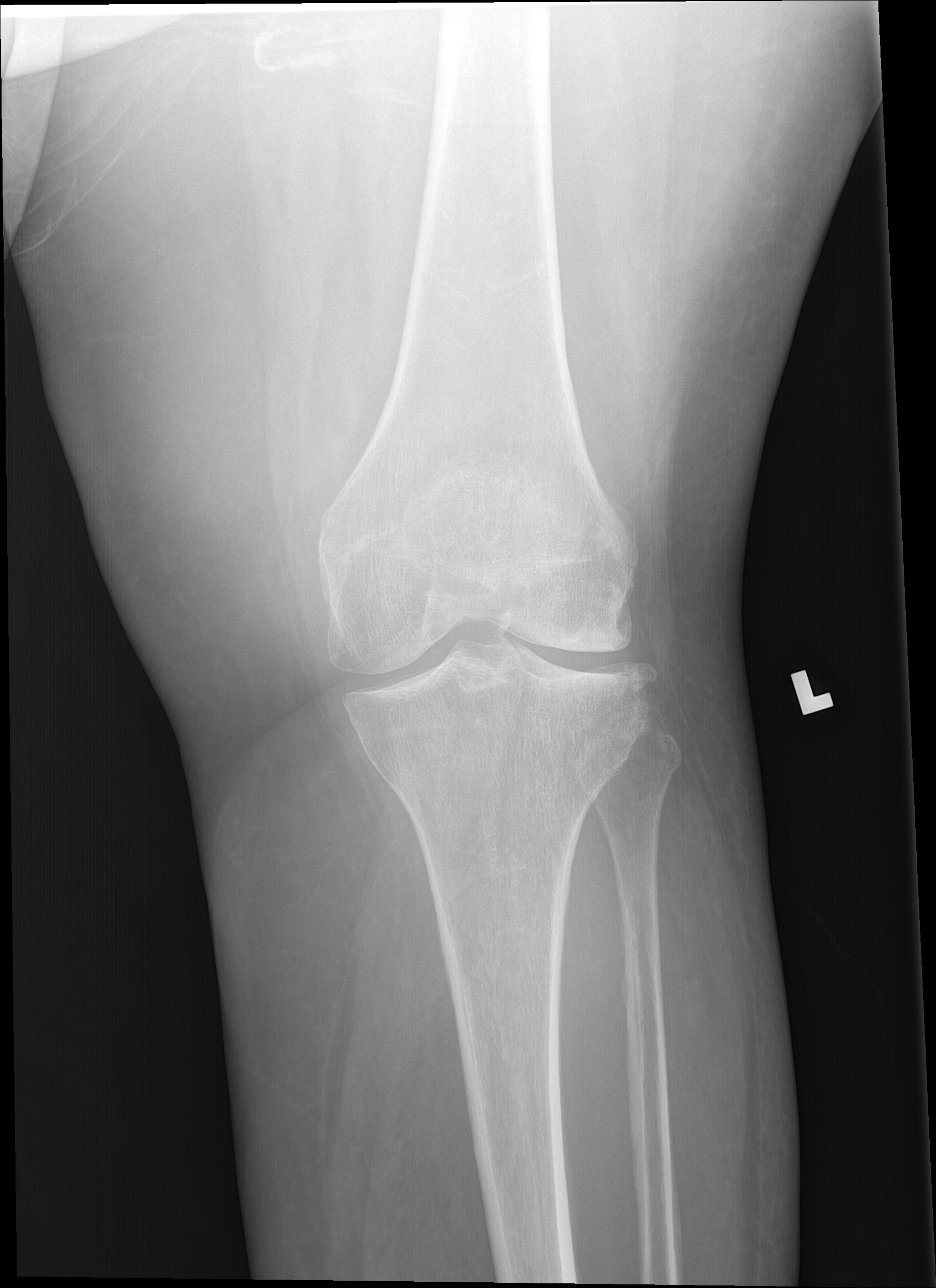
[im 3/4]
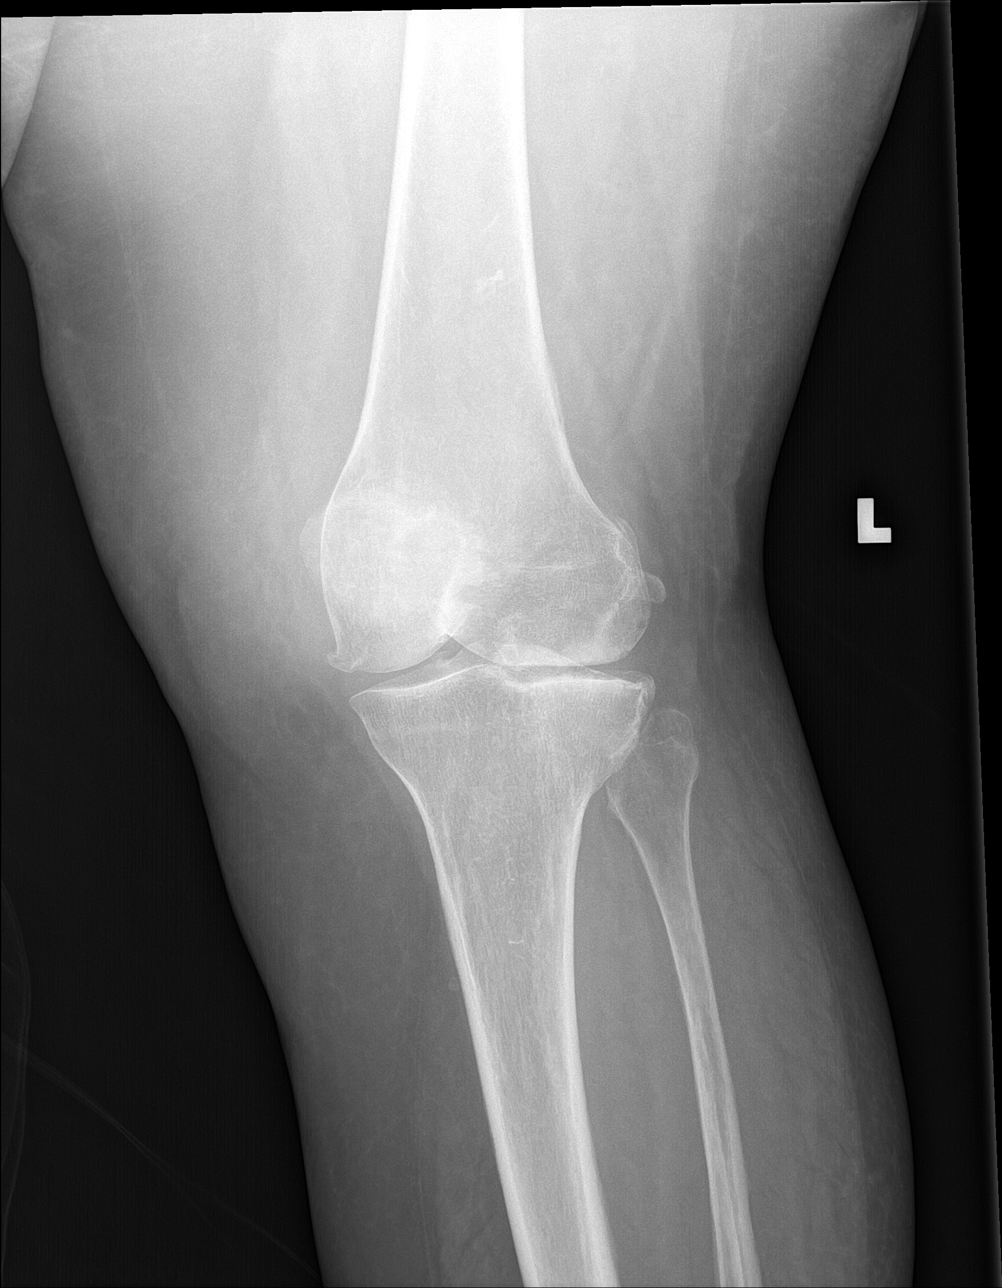
[im 4/4]
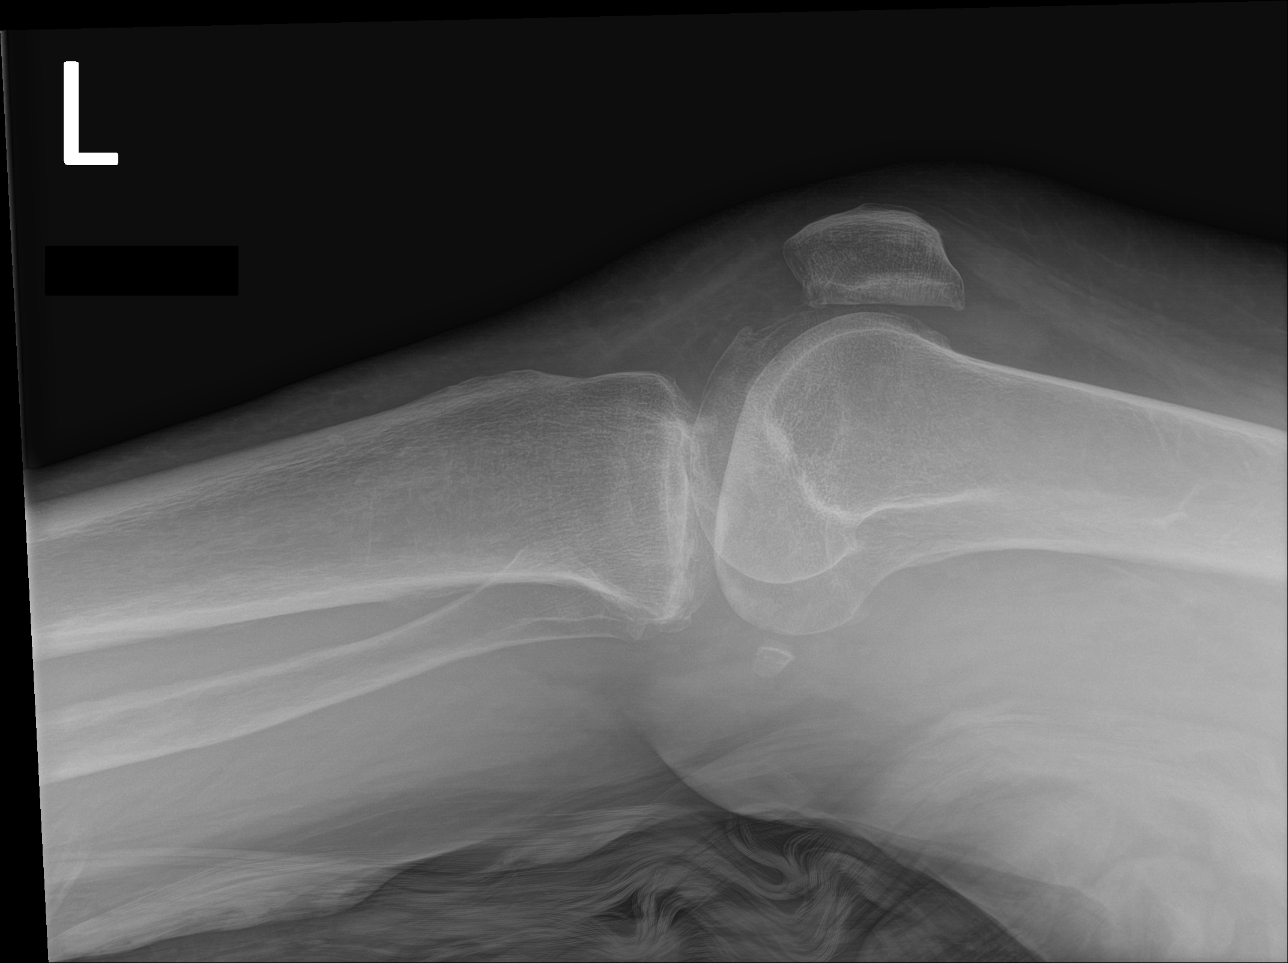

[4 of 4 positions shown; findings below may reference images not displayed]

FINDINGS: There is a small joint effusion. There is no acute fracture or
dislocation identified. There is tricompartmental osteophyte
formation most significant in the lateral compartment. Joint spaces
are well maintained. Alignment is anatomic.
IMPRESSION: 1. No acute bony abnormality.
2. Small joint effusion.
3. Mild tricompartmental osteoarthrosis.

## 2024-01-01 NOTE — Telephone Encounter (Signed)
 Called to inform pt of the PA denial for Rimegepant Sulfate (NURTEC) 75 MG TBDP. Pt wants to know why we won't just pick up the phone and call Dr. JAYSON on [address] instead of asking her all these questions? Pt says she is tired of us  playing with her, I don't know if yall think I'm lying, but I don't play when it comes to my body and my head. Pt states she will be here tomorrow for a NV and will provide the full name of the provider at that time.   (The NV schedule for tomorrow is blocked due to staffing, however I added the patient for 2:40 for an ambulation test for her oxygen  requirement to avoid making her even more upset and agitated.)

## 2024-01-01 NOTE — Telephone Encounter (Signed)
 Copied from CRM #[id number]. Topic: General - Other >> Dec 28, 2023  3:21 PM Paige D wrote: Reason for CRM: pt calling  in regards to her medication its making her sick to her stomach.  Pt states she aint taking that medication no more pt states she still has not received the medication for her headaches. Pt was very rude and screaming and demanding. Pt asked to speak to who ever sent the medication over.  Called CAL, office was going to put me on hold to get supervisor pt hung up before cal could get supervisor. Please reach out to pt in regards to situation. >> Dec 28, 2023  4:11 PM Roxie KIDD wrote: Requested call be sent to office managers for review.

## 2024-01-01 NOTE — Progress Notes (Deleted)
 SATURATION QUALIFICATIONS: (This note is used to comply with regulatory documentation for home oxygen )  Patient Saturations on Room Air at Rest = ***%  Patient Saturations on Room Air while Ambulating = ***%  Patient Saturations on (N/A) Liters of oxygen  while Ambulating = %  Routing to PCP for review and documentation.

## 2024-01-02 ENCOUNTER — Ambulatory Visit

## 2024-01-02 ENCOUNTER — Other Ambulatory Visit: Payer: Self-pay | Admitting: Family Medicine

## 2024-01-02 DIAGNOSIS — J449 Chronic obstructive pulmonary disease, unspecified: Secondary | ICD-10-CM | POA: Diagnosis not present

## 2024-01-02 DIAGNOSIS — G4709 Other insomnia: Secondary | ICD-10-CM

## 2024-01-02 MED ORDER — DOXEPIN HCL 10 MG PO CAPS: 10.0000 mg | ORAL_CAPSULE | Freq: Every evening | ORAL | 0 refills | Status: DC | PRN

## 2024-01-02 MED ORDER — UMECLIDINIUM-VILANTEROL 62.5-25 MCG/ACT IN AEPB: 1.0000 | INHALATION_SPRAY | Freq: Every day | RESPIRATORY_TRACT | 0 refills | Status: AC

## 2024-01-02 MED ORDER — LEVOTHYROXINE SODIUM 125 MCG PO TABS
125.0000 ug | ORAL_TABLET | Freq: Every day | ORAL | 0 refills | Status: AC
Start: 2024-01-02 — End: ?

## 2024-01-02 NOTE — Progress Notes (Signed)
 SATURATION QUALIFICATIONS: (This note is used to comply with regulatory documentation for home oxygen )  Patient Saturations on Room Air at Rest = 81%  Patient Saturations on Room Air while Ambulating = 82%  Patient Saturations on 2 Liters of oxygen  while Ambulating = 81-85%  Patient states she needs a ambulation test for [facility] to approve home oxygen  with humidity.  Pt became SOB and complained of her chest getting tight.

## 2024-01-02 NOTE — Progress Notes (Unsigned)
 Recommend doxepin  as an alternative to trazodone . Rx sent.

## 2024-01-02 NOTE — Progress Notes (Signed)
 Noted

## 2024-01-04 ENCOUNTER — Other Ambulatory Visit: Payer: Self-pay | Admitting: Family Medicine

## 2024-01-04 DIAGNOSIS — J449 Chronic obstructive pulmonary disease, unspecified: Secondary | ICD-10-CM

## 2024-01-04 NOTE — Telephone Encounter (Signed)
 Called patient and informed her of Dr. Oswald recommendation of a trial of rizatriptan. Patient stated that she is okay with doing that. Informed that it will be sent to her pharmacy Dr. Sebastian has to send in the Rx.

## 2024-01-04 NOTE — Telephone Encounter (Signed)
 Called patient and informed her that Dr. Sebastian would like for her to come into the office to review the scans with her since he did not order them. She then stated that she was not coming back into the office if she does not have to. She then asked if something is wrong with her and that is why nobody wants to tell her about her scans. Informed her that I can not give her a diagnosis that Dr. Sebastian will have to review and discuss with her. I then advised her that she can give the hospital a call and ask if someone there can review her scans since that is where they were ordered and she does not want to come into the office. She asked what hospital does she need to call and I informed her that would be [facility]. She then began to ask about other appointments with in the [facility] system. I let her know that she has an appointment at Pulmonary on 01/22/24 and stated for her to have a great day and weekend.

## 2024-01-04 NOTE — Telephone Encounter (Signed)
 Michele Owens

## 2024-01-04 NOTE — Telephone Encounter (Signed)
 Copied from CRM #[id number]. Topic: Clinical - Medication Refill >> Jan 04, 2024  9:37 AM Armenia J wrote: Medication:  levothyroxine  (SYNTHROID ) 125 MCG tablet dicyclomine  (BENTYL ) 20 MG tablet anoro ellipra  Has the patient contacted their pharmacy? No (Agent: If no, request that the patient contact the pharmacy for the refill. If patient does not wish to contact the pharmacy document the reason why and proceed with request.) (Agent: If yes, when and what did the pharmacy advise?) Patient said   This is the patient's preferred pharmacy:  [facility] - [place], [place] - [address] [place] [address] Phone: [phone] Fax: [phone]  Is this the correct pharmacy for this prescription? Yes If no, delete pharmacy and type the correct one.   Has the prescription been filled recently? No  Is the patient out of the medication? Yes  Has the patient been seen for an appointment in the last year OR does the patient have an upcoming appointment? Yes  Can we respond through MyChart? No  Agent: Please be advised that Rx refills may take up to 3 business days. We ask that you follow-up with your pharmacy.

## 2024-01-06 MED ORDER — RIZATRIPTAN BENZOATE 10 MG PO TABS: 10.0000 mg | ORAL_TABLET | ORAL | 0 refills | Status: DC | PRN

## 2024-01-06 NOTE — Addendum Note (Signed)
 Addended by: [name] on: 01/06/2024 08:22 PM   Modules accepted: Orders

## 2024-01-07 NOTE — Telephone Encounter (Signed)
 Copied from CRM #[id number]. Topic: Clinical - Medical Advice >> Jan 04, 2024  9:43 AM [place] J wrote: Reason for CRM: Patient is needing to speak with Dr. Oswald nurse but did not specify why.  Please call patient at: [phone] >> Jan 04, 2024 11:48 AM Adelita E wrote: Patient called back stating it has to do with her oxygen  tubing and a new oxygen  machine. She also stated that she was supposed to have someone come to her house for home visits to check her blood pressure and stated that nothing has been set up.

## 2024-01-07 NOTE — Telephone Encounter (Signed)
 Forwarding message below. Were walking test results faxed to [facility]? Please see message below.  Pt is also requesting home health care.

## 2024-01-08 NOTE — Telephone Encounter (Signed)
 I don't see the results in the chart of a nurse visit for ambulatory pulse ox to measure oxygent saturation. Can you please follow up to see if these were done? Home Health Orders were placed 12/25/2023. Can we please follow up on this as well.

## 2024-01-09 NOTE — Telephone Encounter (Signed)
 Pt called yelling, stating that she called [facility] and they told her that they have nothing to do with water  aerobics, that is completely up to her PCP, and we are telling a story. Pt asked why did I tell her to call [facility]? I advised pt that I did not tell her to call [facility], I told her to call the facility she is interested in taking water  aerobics at. Pt states, ok.

## 2024-01-09 NOTE — Telephone Encounter (Signed)
 E2C2 called with pt on the line, upset that 1. Her BP was not checked at her last visit here at the office, 2. She never received the voucher for water  aerobics from PCP, 3. The office never sent documentation to [facility] for ambulatory test performed on 01/02/24, stating she was not hanging up until she spoke to someone in charge, and was never coming to this office again.  Patient's last visit was on 01/02/24 and was a nurse visit for ambulation test with pulse ox, not for a BP check, with this nurse. During NV, pt never asked for her BP to be checked (I would have asked the PCP to write an order for me to check BP while pt was in the office is she requested). When I advised pt, she stated well I'm not worried about that. Why didn't he give me the voucher for water  aerobics? I asked pt to provide me some context around the voucher - is it a form that needs to be signed by PCP, is it something she sent to the office, etc. - as I do not work directly with Dr. Sebastian and had no knowledge of what she was speaking of to be able to help. Pt lets out a big sigh Reford lain Goooodddddd! How do you not know?! I explained that I am not the MA, I do not work directly with Dr. Sebastian, I am trying to help, and I needed her to calm down. Pt states PCP told her at last visit he would get her a voucher. I spoke with PCP who does not recollect any discussion around a voucher for water  aerobics but advised me to share with pt that her [facility] company would need to perform an assessment to determine if she can safely participate in outpatient activities such as water  aerobics. (Spoke with Dewayne at [facility] after speaking to pt regarding this. They do not have anyone that they send to the pt home for this. Will notify pt). Spoke with Dewayne at [facility]. Reports they have all documentation needed for DME order, updated yesterday 01/08/24, and nothing further needed from [facility] at this time. [facility] due to go to pt's home today  01/09/24.

## 2024-01-09 NOTE — Telephone Encounter (Signed)
 Called pt to inform her that [facility] does not perform assessment for outpatient activities. Pt is yelling, stating he was supposed to been done that. Why can't yall call, why can't yall do that, that is yall job. I explained that being that water  aerobics is an optional activity that she is interested in, not an order or referral from provider, it is not the provider's or office's responsibility to contact the facility she is wanting to take water  aerobics at, that is her responsibility as the pt/individual. I apologized that she was upset, however I am asking that she be respectful and polite when she calls and speaks with staff. I told pt that I was not sure who else in the office she has spoken with, but just during the call this morning she has done nothing but yell. I advised pt that she cannot speak to staff that way, especially when all we are trying to do is help her. Pt states ok, I will call you back.

## 2024-01-10 ENCOUNTER — Telehealth: Payer: Self-pay | Admitting: Family Medicine

## 2024-01-10 NOTE — Telephone Encounter (Signed)
 [facility] called and spoke to [place], Pensions consultant about the refill(s) levothyroxine  requested. Advised it was sent on 01/02/24 #90/0 refill(s). She says the patient's insurance ran out on 01/08/24, so she will need to bring her new insurance card for them to process it. I asked if she will call the patient and she says yes she will call.   Copied from CRM [id number]. Topic: Clinical - Prescription Issue >> Jan 10, 2024  4:18 PM Rea ORN wrote: Reason for CRM: pt called very upset that she is out of medication and her PCP has not sent anything in for her. I inquired what was she out of and she stated her thyroid  rx. I told pt her Levothyroxine  was ordered and sent to [facility] on 9/24.  Pt stated the pharmacy told her they were waiting on PCP to send it to them.  Please call pt back to advise else can be done. Pt has been out of her rx for a couple of days.

## 2024-01-11 ENCOUNTER — Ambulatory Visit: Admitting: Family Medicine

## 2024-01-14 ENCOUNTER — Emergency Department (HOSPITAL_COMMUNITY)
Admission: EM | Admit: 2024-01-14 | Discharge: 2024-01-15 | Disposition: A | Discharge status: Home / Self Care | Attending: Emergency Medicine | Admitting: Emergency Medicine

## 2024-01-14 ENCOUNTER — Encounter (HOSPITAL_COMMUNITY): Payer: Self-pay

## 2024-01-14 ENCOUNTER — Other Ambulatory Visit: Payer: Self-pay

## 2024-01-14 DIAGNOSIS — F439 Reaction to severe stress, unspecified: Secondary | ICD-10-CM | POA: Insufficient documentation

## 2024-01-14 DIAGNOSIS — I509 Heart failure, unspecified: Secondary | ICD-10-CM | POA: Diagnosis not present

## 2024-01-14 DIAGNOSIS — Z87891 Personal history of nicotine dependence: Secondary | ICD-10-CM | POA: Insufficient documentation

## 2024-01-14 DIAGNOSIS — J449 Chronic obstructive pulmonary disease, unspecified: Secondary | ICD-10-CM | POA: Diagnosis not present

## 2024-01-14 DIAGNOSIS — Z85850 Personal history of malignant neoplasm of thyroid: Secondary | ICD-10-CM | POA: Diagnosis not present

## 2024-01-14 DIAGNOSIS — E119 Type 2 diabetes mellitus without complications: Secondary | ICD-10-CM | POA: Insufficient documentation

## 2024-01-14 DIAGNOSIS — I10 Essential (primary) hypertension: Secondary | ICD-10-CM | POA: Insufficient documentation

## 2024-01-14 DIAGNOSIS — J45909 Unspecified asthma, uncomplicated: Secondary | ICD-10-CM | POA: Insufficient documentation

## 2024-01-14 DIAGNOSIS — R0602 Shortness of breath: Secondary | ICD-10-CM | POA: Diagnosis present

## 2024-01-14 NOTE — Telephone Encounter (Signed)
 Noted

## 2024-01-14 NOTE — ED Triage Notes (Signed)
 PER EMS: pt reports shortness of breath, wears 2L [name] at home, currently on 4L [place] and she also reports chest pain. She is also requests mental health evaluation, told EMS she is homicidal.   BP- 140/90, HR-96, O2-96% 4L [place]

## 2024-01-15 ENCOUNTER — Emergency Department (HOSPITAL_COMMUNITY)

## 2024-01-15 LAB — COMPREHENSIVE METABOLIC PANEL WITH GFR
ALT: 16 U/L (ref 0–44)
AST: 24 U/L (ref 15–41)
Albumin: 4.1 g/dL (ref 3.5–5.0)
Alkaline Phosphatase: 61 U/L (ref 38–126)
Anion gap: 13 (ref 5–15)
BUN: 16 mg/dL (ref 8–23)
CO2: 32 mmol/L (ref 22–32)
Calcium: 8.4 mg/dL — ABNORMAL LOW (ref 8.9–10.3)
Chloride: 98 mmol/L (ref 98–111)
Creatinine, Ser: 0.94 mg/dL (ref 0.44–1.00)
GFR, Estimated: >60 mL/min (ref >60)
Glucose, Bld: 183 mg/dL — ABNORMAL HIGH (ref 70–99)
Potassium: 3.8 mmol/L (ref 3.5–5.1)
Sodium: 143 mmol/L (ref 135–145)
Total Bilirubin: 0.6 mg/dL (ref 0.0–1.2)
Total Protein: 7.2 g/dL (ref 6.5–8.1)

## 2024-01-15 LAB — CBC
HCT: 41.9 % (ref 36.0–46.0)
Hemoglobin: 12.5 g/dL (ref 12.0–15.0)
MCH: 29.6 pg (ref 26.0–34.0)
MCHC: 29.8 g/dL — ABNORMAL LOW (ref 30.0–36.0)
MCV: 99.3 fL (ref 80.0–100.0)
Platelets: 289 K/uL (ref 150–400)
RBC: 4.22 MIL/uL (ref 3.87–5.11)
RDW: 13.5 % (ref 11.5–15.5)
WBC: 17.8 K/uL — ABNORMAL HIGH (ref 4.0–10.5)
nRBC: 0 % (ref 0.0–0.2)

## 2024-01-15 LAB — RAPID URINE DRUG SCREEN, HOSP PERFORMED
Amphetamines: NOT DETECTED
Barbiturates: NOT DETECTED
Benzodiazepines: NOT DETECTED
Cocaine: NOT DETECTED
Opiates: NOT DETECTED
Tetrahydrocannabinol: NOT DETECTED

## 2024-01-15 LAB — TROPONIN I (HIGH SENSITIVITY): Troponin I (High Sensitivity): 6 ng/L (ref <18)

## 2024-01-15 LAB — ETHANOL: Alcohol, Ethyl (B): <15 mg/dL (ref <15)

## 2024-01-15 MED ORDER — CEPHALEXIN 500 MG PO CAPS
500.0000 mg | ORAL_CAPSULE | Freq: Three times a day (TID) | ORAL | 0 refills | Status: DC
Start: 2024-01-15 — End: 2024-01-23

## 2024-01-15 NOTE — ED Notes (Signed)
 Patient transported to X-ray

## 2024-01-15 NOTE — ED Provider Notes (Signed)
 [facility]-EMERGENCY DEPT [facility] Emergency Department Provider Note MRN:  [id number]  Arrival date & time: 01/15/24     Chief Complaint   Homicidal and Shortness of Breath   History of Present Illness   Michele Owens is a 63 y.o. year-old female with a history of CHF, COPD presenting to the ED with chief complaint of shortness of breath.  Some increased shortness of breath especially when laying flat over the past few weeks.  Patient more interested in talking about her family stresses recently.  Was reminded of her son's death by family or friend today and that made her upset.  Also having a lot of drama among her family.  In an episode of anger she told EMS that she was going to hurt a woman but she denies that this is her true intention.  Review of Systems  A thorough review of systems was obtained and all systems are negative except as noted in the HPI and PMH.   Patient's Health History    Past Medical History:  Diagnosis Date   Anxiety    Arthritis    Asthma    Chronic diastolic CHF (congestive heart failure) (HCC)    COPD (chronic obstructive pulmonary disease) (HCC)    Uses Oxygen  at night   Depression    Diabetes mellitus without complication (HCC)    GERD (gastroesophageal reflux disease)    Gout    Headache    migraines   History of DVT of lower extremity    Hypertension    Morbid obesity with BMI of 45.0-49.9, adult (HCC)    Thyroid  cancer (HCC) 09/23/2016    Past Surgical History:  Procedure Laterality Date   BUNIONECTOMY Bilateral    CARDIAC CATHETERIZATION N/A 06/23/2015   Procedure: Right/Left Heart Cath and Coronary Angiography;  Surgeon: Ezra GORMAN Shuck, MD;  Location[facility] INVASIVE CV LAB;  Service: Cardiovascular;  Laterality: N/A;   COLONOSCOPY WITH PROPOFOL  N/A 12/15/2015   Procedure: COLONOSCOPY WITH PROPOFOL ;  Surgeon: Norleen Hint, MD;  Location: [facility] ENDOSCOPY;  Service: Endoscopy;  Laterality: N/A;   RIGHT HEART CATH N/A 10/01/2019   Procedure:  RIGHT HEART CATH;  Surgeon: Shuck Ezra GORMAN, MD;  Location: [facility] INVASIVE CV LAB;  Service: Cardiovascular;  Laterality: N/A;   THYROIDECTOMY  09/19/2016   THYROIDECTOMY N/A 09/19/2016   Procedure: TOTAL THYROIDECTOMY;  Surgeon: Eletha Boas, MD;  Location: [facility] OR;  Service: General;  Laterality: N/A;   TONSILLECTOMY     TOTAL ABDOMINAL HYSTERECTOMY  07/14/10    Family History  Problem Relation Age of Onset   Cancer Father        thought to be due to exposure to concrete   Diabetes Mother    Thyroid  cancer Mother        dx in her 50s-60s   Cancer Maternal Uncle        2 uncles with cancer NOS   Brain cancer Paternal Aunt    Cancer Cousin        maternal first cousin - NOS   Cancer Cousin        maternal first cousin - NOS    Social History   Socioeconomic History   Marital status: Single    Spouse name: Not on file   Number of children: 1   Years of education: Not on file   Highest education level: Not on file  Occupational History   Occupation: disabled  Tobacco Use   Smoking status: Former    Current packs/day:  0.00    Average packs/day: 0.5 packs/day for 41.0 years (20.5 ttl pk-yrs)    Types: Cigarettes    Start date: 07/1975    Quit date: 07/2016    Years since quitting: 7.5   Smokeless tobacco: Never   Tobacco comments:    Pt stop smoking 07/04/2011  Vaping Use   Vaping status: Never Used  Substance and Sexual Activity   Alcohol use: Not Currently    Comment: quit 12 years ago   Drug use: Not Currently    Types: Marijuana, Cocaine    Comment: quit 12 years ago   Sexual activity: Yes  Other Topics Concern   Not on file  Social History Narrative   Not on file   Social Drivers of Health   Financial Resource Strain: Low Risk  (10/24/2022)   Overall Financial Resource Strain (CARDIA)    Difficulty of Paying Living Expenses: Not very hard  Food Insecurity: No Food Insecurity (09/09/2023)   Hunger Vital Sign    Worried About Running Out of Food in the Last Year:  Never true    Ran Out of Food in the Last Year: Never true  Transportation Needs: No Transportation Needs (09/09/2023)   PRAPARE - Administrator, Civil Service (Medical): No    Lack of Transportation (Non-Medical): No  Physical Activity: Inactive (02/01/2021)   Exercise Vital Sign    Days of Exercise per Week: 0 days    Minutes of Exercise per Session: 0 min  Stress: Stress Concern Present (02/23/2021)   [employer] of Occupational Health - Occupational Stress Questionnaire    Feeling of Stress : To some extent  Social Connections: Unknown (08/21/2021)   Received from [facility]   Social Network    Social Network: Not on file  Intimate Partner Violence: Not At Risk (09/09/2023)   Humiliation, Afraid, Rape, and Kick questionnaire    Fear of Current or Ex-Partner: No    Emotionally Abused: No    Physically Abused: No    Sexually Abused: No     Physical Exam   Vitals:   01/14/24 2318  BP: (!) 140/71  Pulse: 87  Resp: 20  Temp: 98.5 F (36.9 C)  SpO2: 94%    CONSTITUTIONAL: Well-appearing, NAD NEURO/PSYCH:  Alert and oriented x 3, no focal deficits EYES:  eyes equal and reactive ENT/NECK:  no LAD, no JVD CARDIO: Regular rate, well-perfused, normal S1 and S2 PULM:  CTAB no wheezing or rhonchi GI/GU:  non-distended, non-tender MSK/SPINE:  No gross deformities, no edema SKIN:  no rash, atraumatic   *Additional and/or pertinent findings included in MDM below  Diagnostic and Interventional Summary    EKG Interpretation Date/Time:  Monday January 14 2024 23:41:43 EDT Ventricular Rate:  90 PR Interval:  168 QRS Duration:  68 QT Interval:  370 QTC Calculation: 452 R Axis:   74  Text Interpretation: Normal sinus rhythm Nonspecific T wave abnormality Abnormal ECG When compared with ECG of 29-Nov-2023 15:32, PREVIOUS ECG IS PRESENT Confirmed by Theadore Sharper [phone]) on 01/15/2024 1:31:53 AM       Labs Reviewed  CBC - Abnormal; Notable for the  following components:      Result Value   WBC 17.8 (*)    MCHC 29.8 (*)    All other components within normal limits  COMPREHENSIVE METABOLIC PANEL WITH GFR - Abnormal; Notable for the following components:   Glucose, Bld 183 (*)    Calcium  8.4 (*)    All other  components within normal limits  ETHANOL  RAPID URINE DRUG SCREEN, HOSP PERFORMED  TROPONIN I (HIGH SENSITIVITY)  TROPONIN I (HIGH SENSITIVITY)    DG Chest 2 View  Final Result      Medications - No data to display   Procedures  /  Critical Care Procedures  ED Course and Medical Decision Making  Initial Impression and Ddx Shortness of breath, possibly orthopnea over the past few weeks.  However patient has no JVD, no significant lower extremity edema, no increased work of breathing.  Was reportedly using increased amount of oxygen  than her baseline with EMS however on my evaluation she is on 2 L which is her baseline.  She has a lot of stressors going on in her life.  There is a triage or EMS report of homicidal intent however I highly doubt this, she says she was just speaking out of anger.  Doubt medical emergent process, doubt unstable psychiatric state at this time.  Past medical/surgical history that increases complexity of ED encounter: COPD, CHF  Interpretation of Diagnostics I personally reviewed the EKG and my interpretation is as follows: Sinus rhythm  No significant blood count or electrolyte disturbance.  Troponin negative, chest x-ray unremarkable, no edema  Patient Reassessment and Ultimate Disposition/Management     Patient also had a boil on her right lower abdomen that she wanted me to look at, seems to be healing well, there is an area of mild tenderness and nodularity.  She does not want incision and drainage, providing her with a short course of Keflex .  Appropriate for discharge.  Patient management required discussion with the following services or consulting groups:  None  Complexity of Problems  Addressed Acute illness or injury that poses threat of life of bodily function  Additional Data Reviewed and Analyzed Further history obtained from: Prior labs/imaging results  Additional Factors Impacting ED Encounter Risk Prescriptions  Ozell HERO. Theadore, MD [facility] Emergency Medicine [facility] [email or web address]  Final Clinical Impressions(s) / ED Diagnoses     ICD-10-CM   1. Shortness of breath  R06.02     2. Stress  F43.9       ED Discharge Orders          Ordered    cephALEXin  (KEFLEX ) 500 MG capsule  3 times daily        01/15/24 0141             Discharge Instructions Discussed with and Provided to Patient:    Discharge Instructions      You were evaluated in the Emergency Department and after careful evaluation, we did not find any emergent condition requiring admission or further testing in the hospital.  Your exam/testing today was overall reassuring.  Keep your follow-up appointment with pulmonology, see your primary care doctor soon, recommend follow-up with behavioral health as well.  Please return to the Emergency Department if you experience any worsening of your condition.  Thank you for allowing us  to be a part of your care.       Theadore Ozell HERO, MD 01/15/24 [phone]

## 2024-01-15 NOTE — Discharge Instructions (Signed)
 You were evaluated in the Emergency Department and after careful evaluation, we did not find any emergent condition requiring admission or further testing in the hospital.  Your exam/testing today was overall reassuring.  Keep your follow-up appointment with pulmonology, see your primary care doctor soon, recommend follow-up with behavioral health as well.  Please return to the Emergency Department if you experience any worsening of your condition.  Thank you for allowing us  to be a part of your care.

## 2024-01-16 ENCOUNTER — Encounter (HOSPITAL_COMMUNITY): Payer: Self-pay

## 2024-01-16 ENCOUNTER — Emergency Department (HOSPITAL_COMMUNITY)
Admission: EM | Admit: 2024-01-16 | Discharge: 2024-01-17 | Disposition: A | Discharge status: Home / Self Care | Attending: Emergency Medicine | Admitting: Emergency Medicine

## 2024-01-16 ENCOUNTER — Emergency Department (HOSPITAL_COMMUNITY)

## 2024-01-16 ENCOUNTER — Ambulatory Visit: Payer: Self-pay

## 2024-01-16 ENCOUNTER — Other Ambulatory Visit: Payer: Self-pay

## 2024-01-16 DIAGNOSIS — Z7982 Long term (current) use of aspirin: Secondary | ICD-10-CM | POA: Insufficient documentation

## 2024-01-16 DIAGNOSIS — I110 Hypertensive heart disease with heart failure: Secondary | ICD-10-CM | POA: Diagnosis not present

## 2024-01-16 DIAGNOSIS — R1032 Left lower quadrant pain: Secondary | ICD-10-CM | POA: Insufficient documentation

## 2024-01-16 DIAGNOSIS — Z85850 Personal history of malignant neoplasm of thyroid: Secondary | ICD-10-CM | POA: Diagnosis not present

## 2024-01-16 DIAGNOSIS — R0600 Dyspnea, unspecified: Secondary | ICD-10-CM

## 2024-01-16 DIAGNOSIS — R911 Solitary pulmonary nodule: Secondary | ICD-10-CM | POA: Diagnosis not present

## 2024-01-16 DIAGNOSIS — R918 Other nonspecific abnormal finding of lung field: Secondary | ICD-10-CM

## 2024-01-16 DIAGNOSIS — R350 Frequency of micturition: Secondary | ICD-10-CM | POA: Diagnosis not present

## 2024-01-16 DIAGNOSIS — R1030 Lower abdominal pain, unspecified: Secondary | ICD-10-CM | POA: Diagnosis present

## 2024-01-16 DIAGNOSIS — E119 Type 2 diabetes mellitus without complications: Secondary | ICD-10-CM | POA: Insufficient documentation

## 2024-01-16 DIAGNOSIS — I5032 Chronic diastolic (congestive) heart failure: Secondary | ICD-10-CM | POA: Diagnosis not present

## 2024-01-16 DIAGNOSIS — R1031 Right lower quadrant pain: Secondary | ICD-10-CM | POA: Diagnosis not present

## 2024-01-16 DIAGNOSIS — R1024 Suprapubic pain: Secondary | ICD-10-CM

## 2024-01-16 DIAGNOSIS — Z79899 Other long term (current) drug therapy: Secondary | ICD-10-CM | POA: Insufficient documentation

## 2024-01-16 DIAGNOSIS — J4489 Other specified chronic obstructive pulmonary disease: Secondary | ICD-10-CM | POA: Diagnosis not present

## 2024-01-16 LAB — CBC WITH DIFFERENTIAL/PLATELET
Abs Immature Granulocytes: 0.13 K/uL — ABNORMAL HIGH (ref 0.00–0.07)
Basophils Absolute: 0 K/uL (ref 0.0–0.1)
Basophils Relative: 0 %
Eosinophils Absolute: 0.2 K/uL (ref 0.0–0.5)
Eosinophils Relative: 2 %
HCT: 38.1 % (ref 36.0–46.0)
Hemoglobin: 11.2 g/dL — ABNORMAL LOW (ref 12.0–15.0)
Immature Granulocytes: 1 %
Lymphocytes Relative: 13 %
Lymphs Abs: 1.8 K/uL (ref 0.7–4.0)
MCH: 29 pg (ref 26.0–34.0)
MCHC: 29.4 g/dL — ABNORMAL LOW (ref 30.0–36.0)
MCV: 98.7 fL (ref 80.0–100.0)
Monocytes Absolute: 0.7 K/uL (ref 0.1–1.0)
Monocytes Relative: 5 %
Neutro Abs: 11.3 K/uL — ABNORMAL HIGH (ref 1.7–7.7)
Neutrophils Relative %: 79 %
Platelets: 291 K/uL (ref 150–400)
RBC: 3.86 MIL/uL — ABNORMAL LOW (ref 3.87–5.11)
RDW: 13.7 % (ref 11.5–15.5)
WBC: 14.2 K/uL — ABNORMAL HIGH (ref 4.0–10.5)
nRBC: 0 % (ref 0.0–0.2)

## 2024-01-16 LAB — COMPREHENSIVE METABOLIC PANEL WITH GFR
ALT: 12 U/L (ref 0–44)
AST: 21 U/L (ref 15–41)
Albumin: 4.2 g/dL (ref 3.5–5.0)
Alkaline Phosphatase: 74 U/L (ref 38–126)
Anion gap: 12 (ref 5–15)
BUN: 14 mg/dL (ref 8–23)
CO2: 32 mmol/L (ref 22–32)
Calcium: 8.7 mg/dL — ABNORMAL LOW (ref 8.9–10.3)
Chloride: 100 mmol/L (ref 98–111)
Creatinine, Ser: 0.81 mg/dL (ref 0.44–1.00)
GFR, Estimated: >60 mL/min (ref >60)
Glucose, Bld: 127 mg/dL — ABNORMAL HIGH (ref 70–99)
Potassium: 3.7 mmol/L (ref 3.5–5.1)
Sodium: 143 mmol/L (ref 135–145)
Total Bilirubin: 0.4 mg/dL (ref 0.0–1.2)
Total Protein: 6.9 g/dL (ref 6.5–8.1)

## 2024-01-16 LAB — URINALYSIS, ROUTINE W REFLEX MICROSCOPIC
Bacteria, UA: NONE SEEN
Bilirubin Urine: NEGATIVE
Glucose, UA: >=500 mg/dL — AB
Hgb urine dipstick: NEGATIVE
Ketones, ur: NEGATIVE mg/dL
Leukocytes,Ua: NEGATIVE
Nitrite: NEGATIVE
Protein, ur: NEGATIVE mg/dL
Specific Gravity, Urine: 1.017 (ref 1.005–1.030)
pH: 7 (ref 5.0–8.0)

## 2024-01-16 LAB — LIPASE, BLOOD: Lipase: 28 U/L (ref 11–51)

## 2024-01-16 LAB — PRO BRAIN NATRIURETIC PEPTIDE: Pro Brain Natriuretic Peptide: 183 pg/mL (ref <300.0)

## 2024-01-16 LAB — TROPONIN T, HIGH SENSITIVITY: Troponin T High Sensitivity: <15 ng/L (ref 0–19)

## 2024-01-16 MED ORDER — IOHEXOL 350 MG/ML SOLN
100.0000 mL | Freq: Once | INTRAVENOUS | Status: AC | PRN
Start: 2024-01-16 — End: 2024-01-16
  Administered 2024-01-16: 100 mL via INTRAVENOUS

## 2024-01-16 MED ORDER — PHENAZOPYRIDINE HCL 200 MG PO TABS: 200.0000 mg | ORAL_TABLET | Freq: Three times a day (TID) | ORAL | Status: DC

## 2024-01-16 MED ORDER — PHENAZOPYRIDINE HCL 200 MG PO TABS: 200.0000 mg | ORAL_TABLET | Freq: Three times a day (TID) | ORAL | 0 refills | Status: DC

## 2024-01-16 MED ORDER — OXYCODONE HCL 5 MG PO TABS
5.0000 mg | ORAL_TABLET | Freq: Once | ORAL | Status: AC
  Administered 2024-01-17: 5 mg via ORAL
  Filled 2024-01-16: qty 1

## 2024-01-16 NOTE — Telephone Encounter (Signed)
 Noted

## 2024-01-16 NOTE — ED Triage Notes (Addendum)
 Pt BIB EMS with reports of urinary frequency, lower abdominal pain, and vaginal pressure since last night. Pt on 3L [place] per baseline.

## 2024-01-16 NOTE — Telephone Encounter (Signed)
 Called patient to follow up and scheduled a ED OV; left VM for call back

## 2024-01-16 NOTE — Telephone Encounter (Signed)
 FYI Only or Action Required?: FYI only for provider.  Patient was last seen in primary care on 12/25/2023 by [name], MD.  Called Nurse Triage reporting Urinary Incontinence, Breathing Problem, and Pain.  Symptoms began incontinence yesterday, pain and breathing difficulty today.  Interventions attempted: Nothing.  Symptoms are: rapidly worsening.  Triage Disposition: Go to ED Now (Notify PCP)  Patient/caregiver understands and will follow disposition?: Yes  Copied from CRM [id number]. Topic: Clinical - Red Word Triage >> Jan 16, 2024 12:21 PM Michele Owens wrote: Red Word that prompted transfer to Nurse Triage: requesting to speak with nurse   Pt stated that every time she moves or gets up she experiences leaking from her bladder and is unable to control it. Pt is requesting to speak with a nurse now. Reason for Disposition  [1] Can't control passage of urine (i.e., urinary incontinence) AND [2] new-onset (< 2 weeks) or worsening  [1] MODERATE difficulty breathing (e.g., speaks in phrases, SOB even at rest, pulse 100-120) AND [2] NEW-onset or WORSE than normal  Answer Assessment - Initial Assessment Questions 1) Leaking urine whenever she moves.  2) She refuses in office appointment due to incontinence, offered virtual visit with disclaimer that she may be asked to come in person and/or provide urine sample to the lab but then she proceeded to say she is actually having other symptoms of difficulty breathing and pain. She is calling ambulance for transport to hospital for evaluation.    1. SYMPTOM: What's the main symptom you're concerned about? (e.g., frequency, incontinence)     incontinence 2. ONSET: When did the  incontinence  start?     01/15/24 3. PAIN: Is there any pain? If Yes, ask: How bad is it? (Scale: 1-10; mild, moderate, severe)     Denies  4. CAUSE: What do you think is causing the symptoms?     unsure 5. OTHER SYMPTOMS: Do you have any other  symptoms? (e.g., blood in urine, fever, flank pain, pain with urination)     Denied all symptoms until offering appointments then stated she is having other symptoms of sudden difficulty breathing and pain 6. PREGNANCY: Is there any chance you are pregnant? When was your last menstrual period?  Protocols used: Urinary Symptoms-A-AH, Breathing Difficulty-A-AH

## 2024-01-16 NOTE — ED Notes (Signed)
 Patient transported to CT

## 2024-01-17 ENCOUNTER — Ambulatory Visit: Payer: Self-pay

## 2024-01-17 MED ORDER — PHENAZOPYRIDINE HCL 200 MG PO TABS
200.0000 mg | ORAL_TABLET | Freq: Three times a day (TID) | ORAL | Status: DC
  Administered 2024-01-17: 200 mg via ORAL
  Filled 2024-01-17: qty 1

## 2024-01-17 NOTE — Telephone Encounter (Signed)
 Copied from CRM #[id number]. Topic: General - Other >> Jan 16, 2024  3:17 PM [place] A wrote: Reason for CRM: Patient is at [name] by ambulance. Agent asked what she was admitted for and patient said that she was not going to tell all of that because the nurse is the one who told her to go to the hospital.

## 2024-01-17 NOTE — ED Notes (Signed)
 [facility] called to transport patient home at this time.

## 2024-01-17 NOTE — Telephone Encounter (Signed)
 Copied from CRM [id number]. Topic: Clinical - Medication Question >> Jan 17, 2024 12:42 PM Dedra B wrote: Reason for CRM: Pt would like a nurse to call her to explain what phenazopyridine is for. She was prescribed the med at the ER yesterday. Reason for Disposition . Health information question, no triage required and triager able to answer question  Answer Assessment - Initial Assessment Questions Patient states she just spoke to her pharmacist who  Protocols used: Information Only Call - No Triage-A-AH

## 2024-01-17 NOTE — Telephone Encounter (Signed)
 Can we contact patient and schedule and ED follow up.

## 2024-01-17 NOTE — ED Provider Notes (Signed)
 [place] EMERGENCY DEPARTMENT AT [facility] Provider Note   CSN: [id number] Arrival date & time: 01/16/24  1358     Patient presents with: Urinary Frequency and Abdominal Pain   Michele Owens is a 63 y.o. female.   HPI      64 year old female with a history of chronic diastolic CHF, COPD, diabetes, DVT, hypertension, depression, anxiety, thyroid  cancer who presents with concern for urinary frequency, lower abdominal pain, and also reports dyspnea.   Had been seen last night with concern of stress and shortness of breath. Reports that the abdominal pain started last night or early this morning.  Its lower abdominal pain, cramping, waxing and waning.  She reports that she has had urinary frequency, as well as urinary incontinence.  Reports today while she was on the phone she had an episode of urinary incontinence.  She denies any bowel incontinence.  Denies any increased back pain.  No new numbness or weakness of her legs.  No fever.  No diarrhea or constipation.  Nausea but no vomiting.  When she lays down flat at night she does have a sensation of shortness of breath.  Her diuretic was increased a month ago.   Past Medical History:  Diagnosis Date   Anxiety    Arthritis    Asthma    Chronic diastolic CHF (congestive heart failure) (HCC)    COPD (chronic obstructive pulmonary disease) (HCC)    Uses Oxygen  at night   Depression    Diabetes mellitus without complication (HCC)    GERD (gastroesophageal reflux disease)    Gout    Headache    migraines   History of DVT of lower extremity    Hypertension    Morbid obesity with BMI of 45.0-49.9, adult (HCC)    Thyroid  cancer (HCC) 09/23/2016    Past Surgical History:  Procedure Laterality Date   BUNIONECTOMY Bilateral    CARDIAC CATHETERIZATION N/A 06/23/2015   Procedure: Right/Left Heart Cath and Coronary Angiography;  Surgeon: Ezra GORMAN Shuck, MD;  Location: [facility] INVASIVE CV LAB;  Service: Cardiovascular;   Laterality: N/A;   COLONOSCOPY WITH PROPOFOL  N/A 12/15/2015   Procedure: COLONOSCOPY WITH PROPOFOL ;  Surgeon: Norleen Hint, MD;  Location: [facility] ENDOSCOPY;  Service: Endoscopy;  Laterality: N/A;   RIGHT HEART CATH N/A 10/01/2019   Procedure: RIGHT HEART CATH;  Surgeon: Shuck Ezra GORMAN, MD;  Location: [facility] INVASIVE CV LAB;  Service: Cardiovascular;  Laterality: N/A;   THYROIDECTOMY  09/19/2016   THYROIDECTOMY N/A 09/19/2016   Procedure: TOTAL THYROIDECTOMY;  Surgeon: Eletha Boas, MD;  Location: [facility] OR;  Service: General;  Laterality: N/A;   TONSILLECTOMY     TOTAL ABDOMINAL HYSTERECTOMY  07/14/10    Prior to Admission medications   Medication Sig Start Date End Date Taking? Authorizing Provider  phenazopyridine (PYRIDIUM) 200 MG tablet Take 1 tablet (200 mg total) by mouth 3 (three) times daily. 01/16/24  Yes Dreama Longs, MD  Accu-Chek Softclix Lancets lancets Use to check blood sugar 3 times daily 12/26/23   [name], MD  ACETAMINOPHEN  PO Take 1,300-2,050 mg by mouth daily as needed for moderate pain (pain score 4-6) or headache.    [provider]  albuterol  (VENTOLIN  HFA) 108 (90 Base) MCG/ACT inhaler Inhale 1-2 puffs into the lungs every 6 (six) hours as needed for wheezing or shortness of breath. 12/26/23   [name], MD  allopurinol  (ZYLOPRIM ) 100 MG tablet Take 1 tablet (100 mg total) by mouth daily. 11/13/23 11/07/24  [name], MD  aspirin  EC 81 MG tablet Take 1 tablet (81 mg total) by mouth daily. Swallow whole. 11/13/23 11/07/24  [name], MD  blood glucose meter kit and supplies KIT Dispense based on patient and insurance preference. Use up to four times daily as directed. 12/25/20   Lue Elsie BROCKS, MD  Blood Glucose Monitoring Suppl DEVI 1 each by Does not apply route in the morning, at noon, and at bedtime. May substitute to any manufacturer covered by patient's insurance. 11/13/23   [name], MD  budesonide  (PULMICORT ) 0.25 MG/2ML nebulizer  solution Inhale 2 mLs (0.25 mg total) by nebulization 2 (two) times daily. 11/13/23   [name], MD  busPIRone  (BUSPAR ) 30 MG tablet Take 1 tablet (30 mg total) by mouth 2 (two) times daily. 12/05/23 11/29/24  [name], MD  carvedilol  (COREG ) 6.25 MG tablet Take 1 tablet (6.25 mg total) by mouth 2 (two) times daily. 12/26/23   [name], MD  cephALEXin  (KEFLEX ) 500 MG capsule Take 1 capsule (500 mg total) by mouth 3 (three) times daily for 7 days. 01/15/24 01/22/24  Theadore Ozell HERO, MD  clotrimazole  (LOTRIMIN ) 1 % cream Apply topically 2 (two) times daily. 12/13/23   [name], MD  diclofenac  Sodium (VOLTAREN ) 1 % GEL Apply 4 g topically 4 (four) times daily. 12/15/23   Freddi Hamilton, MD  dicyclomine  (BENTYL ) 20 MG tablet Take 1 tablet (20 mg total) by mouth 2 (two) times daily. 12/26/23   [name], MD  doxepin  (SINEQUAN ) 10 MG capsule Take 1 capsule (10 mg total) by mouth at bedtime as needed (insomnia). 01/02/24 04/01/24  [name], MD  EPINEPHrine  0.3 mg/0.3 mL IJ SOAJ injection Inject 0.3 mg into the muscle once as needed for anaphylaxis.    [provider]  FARXIGA  10 MG TABS tablet Take 1 tablet (10 mg total) by mouth daily before breakfast. 11/06/23   [name], MD  fenofibrate  (TRICOR ) 145 MG tablet Take 1 tablet (145 mg total) by mouth daily. 12/26/23   [name], MD  FEROSUL 325 (65 Fe) MG tablet Take 1 tablet (325 mg total) by mouth daily. 12/26/23   [name], MD  gabapentin (NEURONTIN) 100 MG capsule Take 1 capsule 3 times a day by oral route as needed for 30 days. 12/06/23   [provider]  glucose blood (ACCU-CHEK GUIDE TEST) test strip Use to check blood sugar 3 times daily 12/26/23   [name], MD  levocetirizine (XYZAL ) 5 MG tablet TAKE 1 TABLET (5 MG TOTAL) BY MOUTH AT BEDTIME. 12/17/23   [name], MD  levothyroxine  (SYNTHROID ) 125 MCG tablet Take 1 tablet (125 mcg total) by mouth at  bedtime. 01/02/24   [name], MD  losartan  (COZAAR ) 25 MG tablet Take 1 tablet (25 mg total) by mouth daily. 11/13/23   [name], MD  lurasidone  (LATUDA ) 20 MG TABS tablet Take 1 tablet (20 mg total) by mouth at bedtime. Take with a snack. This is for mood and sleep. 12/25/23 12/19/24  [name], MD  nitroGLYCERIN  (NITROSTAT ) 0.4 MG SL tablet Place 1 tablet (0.4 mg total) under the tongue every 5 (five) minutes as needed for chest pain. 12/25/23   [name], MD  omeprazole  (PRILOSEC) 20 MG capsule Take 2 capsules (40 mg total) by mouth daily. 11/06/23   [name], MD  ondansetron  (ZOFRAN -ODT) 4 MG disintegrating tablet Take  1 tablet (4 mg total) by mouth every 8 (eight) hours as needed for nausea or vomiting. 12/26/23   [name], MD  OXYGEN  Inhale 2 L into the lungs continuous.    [provider]  potassium chloride  (KLOR-CON ) 10 MEQ tablet Take 1 tablet (10 mEq total) by mouth 2 (two) times daily. 11/13/23   [name], MD  Rimegepant Sulfate (NURTEC) 75 MG TBDP Take 1 tablet (75 mg total) by mouth every other day. Prevents and Treats Migraine Headaches Patient not taking: Reported on 12/31/2023 12/25/23 12/19/24  [name], MD  rizatriptan (MAXALT) 10 MG tablet Take 1 tablet (10 mg total) by mouth as needed for migraine. May repeat in 2 hours if needed 01/06/24   [name], MD  rosuvastatin  (CRESTOR ) 10 MG tablet Take 1 tablet (10 mg total) by mouth every evening. 11/13/23   [name], MD  torsemide  (DEMADEX ) 20 MG tablet Take 2 tablets (40 mg total) by mouth 2 (two) times daily. 11/13/23   [name], MD  umeclidinium-vilanterol (ANORO ELLIPTA ) 62.5-25 MCG/ACT AEPB Inhale 1 puff into the lungs daily. 01/02/24   [name], MD  Vitamin D , Ergocalciferol , (DRISDOL ) 1.25 MG (50000 UNIT) CAPS capsule Take 1 capsule (50,000 Units total) by mouth every 7 (seven) days. 11/13/23   [name], MD    Allergies: Bee  venom, Fioricet  [butalbital -apap-caffeine ], Ibuprofen , Lamisil  [terbinafine ], and Nsaids    Review of Systems  Updated Vital Signs BP 139/67   Pulse 76   Temp 98.7 F (37.1 C)   Resp 17   SpO2 95%   Physical Exam Vitals and nursing note reviewed.  Constitutional:      General: She is not in acute distress.    Appearance: She is well-developed. She is not diaphoretic.  HENT:     Head: Normocephalic and atraumatic.  Eyes:     Conjunctiva/sclera: Conjunctivae normal.  Cardiovascular:     Rate and Rhythm: Normal rate and regular rhythm.     Heart sounds: Normal heart sounds. No murmur heard.    No friction rub. No gallop.  Pulmonary:     Effort: Pulmonary effort is normal. No respiratory distress.     Breath sounds: Normal breath sounds. No wheezing or rales.  Abdominal:     General: There is no distension.     Palpations: Abdomen is soft.     Tenderness: There is abdominal tenderness in the right lower quadrant, suprapubic area and left lower quadrant. There is no guarding.  Musculoskeletal:        General: No tenderness.     Cervical back: Normal range of motion.  Skin:    General: Skin is warm and dry.     Findings: No erythema or rash.  Neurological:     Mental Status: She is alert and oriented to person, place, and time.     (all labs ordered are listed, but only abnormal results are displayed) Labs Reviewed  URINALYSIS, ROUTINE W REFLEX MICROSCOPIC - Abnormal; Notable for the following components:      Result Value   Glucose, UA >=500 (*)    All other components within normal limits  COMPREHENSIVE METABOLIC PANEL WITH GFR - Abnormal; Notable for the following components:   Glucose, Bld 127 (*)    Calcium  8.7 (*)    All other components within normal limits  CBC WITH DIFFERENTIAL/PLATELET - Abnormal; Notable for the following components:   WBC 14.2 (*)    RBC  3.86 (*)    Hemoglobin 11.2 (*)    MCHC 29.4 (*)    Neutro Abs 11.3 (*)    Abs Immature  Granulocytes 0.13 (*)    All other components within normal limits  LIPASE, BLOOD  PRO BRAIN NATRIURETIC PEPTIDE  TROPONIN T, HIGH SENSITIVITY  TROPONIN T, HIGH SENSITIVITY    EKG: EKG Interpretation Date/Time:  Wednesday January 16 2024 21:01:42 EDT Ventricular Rate:  78 PR Interval:  176 QRS Duration:  85 QT Interval:  420 QTC Calculation: 479 R Axis:   64  Text Interpretation: Sinus rhythm Low voltage, precordial leads Borderline T abnormalities, anterior leads When compared with ECG of 01/14/2024, No significant change was found Confirmed by Raford Lenis ([id number]) on 01/17/2024 12:25:34 AM  Radiology: CT ABDOMEN PELVIS W CONTRAST Result Date: 01/16/2024 CLINICAL DATA:  Chest pain and lower abdominal pain, initial encounter EXAM: CT ANGIOGRAPHY CHEST CT ABDOMEN AND PELVIS WITH CONTRAST TECHNIQUE: Multidetector CT imaging of the chest was performed using the standard protocol during bolus administration of intravenous contrast. Multiplanar CT image reconstructions and MIPs were obtained to evaluate the vascular anatomy. Multidetector CT imaging of the abdomen and pelvis was performed using the standard protocol during bolus administration of intravenous contrast. RADIATION DOSE REDUCTION: This exam was performed according to the departmental dose-optimization program which includes automated exposure control, adjustment of the mA and/or kV according to patient size and/or use of iterative reconstruction technique. CONTRAST:  OMNIPAQUE  IOHEXOL  350 MG/ML SOLN COMPARISON:  Chest x-ray from the previous day, CT from 12/13/2022. FINDINGS: CTA CHEST FINDINGS Cardiovascular: Atherosclerotic calcifications of the thoracic aorta are noted. No aneurysmal dilatation or dissection is noted. Heart is mildly enlarged in size. The pulmonary artery shows a normal branching pattern bilaterally. No filling defect to suggest pulmonary embolism is noted. Mediastinum/Nodes: Thoracic inlet is within normal  limits. No hilar or mediastinal adenopathy is noted. Esophagus as visualized is within limits. Lungs/Pleura: Mild basilar atelectasis is noted. No focal infiltrate or sizable effusion is seen. There is an 8 mm nodule identified within the right middle lobe. This previously measured 3 mm in retrospect on the prior exam. Additionally a 9 mm nodule is seen in the medial aspect of the right lower lobe which previously measured 5 mm some patchy nodular change in the left lower lobe is noted as well best seen on image number 77 of series 12. This measures approximately 9 mm in diameter as well. Musculoskeletal: Degenerative changes of the thoracic spine are noted. No acute rib abnormality is seen. Review of the MIP images confirms the above findings. CT ABDOMEN and PELVIS FINDINGS Hepatobiliary: No focal liver abnormality is seen. No gallstones, gallbladder wall thickening, or biliary dilatation. Pancreas: Unremarkable. No pancreatic ductal dilatation or surrounding inflammatory changes. Spleen: Normal in size without focal abnormality. Adrenals/Urinary Tract: Adrenal glands are within normal limits. Kidneys demonstrate a normal enhancement pattern bilaterally. No renal calculi are seen. No obstructive changes are noted. The bladder is well distended. Stomach/Bowel: No obstructive changes of the colon are seen. No inflammatory changes noted. The appendix is air-filled and within normal limits. Small bowel and stomach are within normal limits. Vascular/Lymphatic: No significant vascular findings are present. No enlarged abdominal or pelvic lymph nodes. Reproductive: Status post hysterectomy. No adnexal masses. Other: No abdominal wall hernia or abnormality. No abdominopelvic ascites. Musculoskeletal: Degenerative changes of lumbar spine are noted. Review of the MIP images confirms the above findings. IMPRESSION: CTA of the chest: No evidence of pulmonary emboli. Multiple right-sided nodules  which have increased in size  when compared with the prior exam. These are suspicious given their growth from the prior exam. A new left lower lobe nodule is seen measuring up to 9 mm. Non-contrast chest CT at 3 months is recommended. If the nodules are stable at time of repeat CT, then future CT at 18-24 months (from today's scan) is considered optional for low-risk patients, but is recommended for high-risk patients. This recommendation follows the consensus statement: Guidelines for Management of Incidental Pulmonary Nodules Detected on CT Images: From the Fleischner Society 2017; Radiology 2017; 284:228-243. Pulmonary referral may be helpful as well. CT of the abdomen and pelvis: No acute abnormality is seen. Electronically Signed   By: Oneil Devonshire M.D.   On: 01/16/2024 21:55   CT Angio Chest PE W and/or Wo Contrast Result Date: 01/16/2024 CLINICAL DATA:  Chest pain and lower abdominal pain, initial encounter EXAM: CT ANGIOGRAPHY CHEST CT ABDOMEN AND PELVIS WITH CONTRAST TECHNIQUE: Multidetector CT imaging of the chest was performed using the standard protocol during bolus administration of intravenous contrast. Multiplanar CT image reconstructions and MIPs were obtained to evaluate the vascular anatomy. Multidetector CT imaging of the abdomen and pelvis was performed using the standard protocol during bolus administration of intravenous contrast. RADIATION DOSE REDUCTION: This exam was performed according to the departmental dose-optimization program which includes automated exposure control, adjustment of the mA and/or kV according to patient size and/or use of iterative reconstruction technique. CONTRAST:  OMNIPAQUE  IOHEXOL  350 MG/ML SOLN COMPARISON:  Chest x-ray from the previous day, CT from 12/13/2022. FINDINGS: CTA CHEST FINDINGS Cardiovascular: Atherosclerotic calcifications of the thoracic aorta are noted. No aneurysmal dilatation or dissection is noted. Heart is mildly enlarged in size. The pulmonary artery shows a normal  branching pattern bilaterally. No filling defect to suggest pulmonary embolism is noted. Mediastinum/Nodes: Thoracic inlet is within normal limits. No hilar or mediastinal adenopathy is noted. Esophagus as visualized is within limits. Lungs/Pleura: Mild basilar atelectasis is noted. No focal infiltrate or sizable effusion is seen. There is an 8 mm nodule identified within the right middle lobe. This previously measured 3 mm in retrospect on the prior exam. Additionally a 9 mm nodule is seen in the medial aspect of the right lower lobe which previously measured 5 mm some patchy nodular change in the left lower lobe is noted as well best seen on image number 77 of series 12. This measures approximately 9 mm in diameter as well. Musculoskeletal: Degenerative changes of the thoracic spine are noted. No acute rib abnormality is seen. Review of the MIP images confirms the above findings. CT ABDOMEN and PELVIS FINDINGS Hepatobiliary: No focal liver abnormality is seen. No gallstones, gallbladder wall thickening, or biliary dilatation. Pancreas: Unremarkable. No pancreatic ductal dilatation or surrounding inflammatory changes. Spleen: Normal in size without focal abnormality. Adrenals/Urinary Tract: Adrenal glands are within normal limits. Kidneys demonstrate a normal enhancement pattern bilaterally. No renal calculi are seen. No obstructive changes are noted. The bladder is well distended. Stomach/Bowel: No obstructive changes of the colon are seen. No inflammatory changes noted. The appendix is air-filled and within normal limits. Small bowel and stomach are within normal limits. Vascular/Lymphatic: No significant vascular findings are present. No enlarged abdominal or pelvic lymph nodes. Reproductive: Status post hysterectomy. No adnexal masses. Other: No abdominal wall hernia or abnormality. No abdominopelvic ascites. Musculoskeletal: Degenerative changes of lumbar spine are noted. Review of the MIP images confirms the  above findings. IMPRESSION: CTA of the chest: No evidence  of pulmonary emboli. Multiple right-sided nodules which have increased in size when compared with the prior exam. These are suspicious given their growth from the prior exam. A new left lower lobe nodule is seen measuring up to 9 mm. Non-contrast chest CT at 3 months is recommended. If the nodules are stable at time of repeat CT, then future CT at 18-24 months (from today's scan) is considered optional for low-risk patients, but is recommended for high-risk patients. This recommendation follows the consensus statement: Guidelines for Management of Incidental Pulmonary Nodules Detected on CT Images: From the Fleischner Society 2017; Radiology 2017; 284:228-243. Pulmonary referral may be helpful as well. CT of the abdomen and pelvis: No acute abnormality is seen. Electronically Signed   By: Oneil Devonshire M.D.   On: 01/16/2024 21:55     Procedures   Medications Ordered in the ED  iohexol  (OMNIPAQUE ) 350 MG/ML injection 100 mL (100 mLs Intravenous Contrast Given 01/16/24 2116)  oxyCODONE  (Oxy IR/ROXICODONE ) immediate release tablet 5 mg (5 mg Oral Given 01/17/24 0025)                                      63 year old female with a history of chronic diastolic CHF, COPD, diabetes, DVT, hypertension, depression, anxiety, thyroid  cancer who presents with concern for urinary frequency, lower abdominal pain, and also reports dyspnea.  DDx includes appendicitis, pancreatitis, cholecystitis, pyelonephritis, nephrolithiasis, diverticulitis, bowel obstruction, pelvic floor dysfunction, IC as well as ACS, CHF, PE, pneumonia, pneumothorax for dyspnea.  EKG completed and evaluated by me shows no acute abnormalities.  Labs personally evaluated and interpreted by me show proBNP within normal limits, lower suspicion for acute CHF exacerbation.  Troponin is within normal limits and no suspicion for ACS.  Urinalysis shows no evidence of urinary tract infection.   Electrolytes are within normal limits and no signs of transaminitis, no signs of pancreatitis.  White blood cell count is mildly elevated at 14,000.  Given dyspnea with negative chest x-ray yesterday, no clear clinical signs of heart failure and history of DVT, and a CT PE study was completed which showed no evidence of PE.  CT does show multiple right sided lung nodules which I discussed with patient and recommend noncontrast chest CT in 3 months.  Was not  Given abdominal pain and tenderness, CT was obtained to evaluate for acute abnormalities and showed no evidence of medical or surgical emergencies.  Clinically she does not have signs of cauda equina.  Suspect pelvic floor abnormalities, consider interstitial cystitis.  Will treat her symptoms with Pyridium.  Recommend PCP follow-up. Patient discharged in stable condition with understanding of reasons to return.     Final diagnoses:  Suprapubic pain  Dyspnea, unspecified type  Urinary frequency  Pulmonary nodules    ED Discharge Orders          Ordered    phenazopyridine (PYRIDIUM) 200 MG tablet  3 times daily        01/16/24 2359               Dreama Longs, MD 01/17/24 1350

## 2024-01-17 NOTE — ED Notes (Signed)
 [facility] here to transport patient home at this time.

## 2024-01-17 NOTE — Telephone Encounter (Signed)
 Noted

## 2024-01-18 ENCOUNTER — Other Ambulatory Visit: Payer: Self-pay | Admitting: Family Medicine

## 2024-01-18 NOTE — Telephone Encounter (Signed)
 Copied from CRM [id number]. Topic: Clinical - Medication Refill >> Jan 18, 2024  9:37 AM Vena HERO wrote: Medication: gabapentin (NEURONTIN) 100 MG capsule  Has the patient contacted their pharmacy? No (Agent: If no, request that the patient contact the pharmacy for the refill. If patient does not wish to contact the pharmacy document the reason why and proceed with request.) (Agent: If yes, when and what did the pharmacy advise?)  This is the patient's preferred pharmacy:  [facility] - [place], [place] - [address] [place] [address] Phone: [phone] Fax: [phone]  Is this the correct pharmacy for this prescription? Yes If no, delete pharmacy and type the correct one.   Has the prescription been filled recently? No  Is the patient out of the medication? No  Has the patient been seen for an appointment in the last year OR does the patient have an upcoming appointment?Yes 12/25/2023  Can we respond through MyChart? No  Agent: Please be advised that Rx refills may take up to 3 business days. We ask that you follow-up with your pharmacy.

## 2024-01-20 ENCOUNTER — Emergency Department (HOSPITAL_COMMUNITY)
Admission: EM | Admit: 2024-01-20 | Discharge: 2024-01-20 | Disposition: A | Discharge status: Home / Self Care | Source: Home / Self Care | Attending: Emergency Medicine | Admitting: Emergency Medicine

## 2024-01-20 ENCOUNTER — Emergency Department (HOSPITAL_COMMUNITY)

## 2024-01-20 ENCOUNTER — Encounter (HOSPITAL_COMMUNITY): Payer: Self-pay

## 2024-01-20 ENCOUNTER — Other Ambulatory Visit: Payer: Self-pay

## 2024-01-20 DIAGNOSIS — R0602 Shortness of breath: Secondary | ICD-10-CM

## 2024-01-20 DIAGNOSIS — R350 Frequency of micturition: Secondary | ICD-10-CM | POA: Insufficient documentation

## 2024-01-20 DIAGNOSIS — J449 Chronic obstructive pulmonary disease, unspecified: Secondary | ICD-10-CM | POA: Insufficient documentation

## 2024-01-20 DIAGNOSIS — I5032 Chronic diastolic (congestive) heart failure: Secondary | ICD-10-CM | POA: Insufficient documentation

## 2024-01-20 DIAGNOSIS — Z7982 Long term (current) use of aspirin: Secondary | ICD-10-CM | POA: Insufficient documentation

## 2024-01-20 DIAGNOSIS — I110 Hypertensive heart disease with heart failure: Secondary | ICD-10-CM | POA: Insufficient documentation

## 2024-01-20 DIAGNOSIS — E119 Type 2 diabetes mellitus without complications: Secondary | ICD-10-CM | POA: Insufficient documentation

## 2024-01-20 LAB — CBC WITH DIFFERENTIAL/PLATELET
Abs Immature Granulocytes: 0.12 K/uL — ABNORMAL HIGH (ref 0.00–0.07)
Basophils Absolute: 0 K/uL (ref 0.0–0.1)
Basophils Relative: 0 %
Eosinophils Absolute: 0.2 K/uL (ref 0.0–0.5)
Eosinophils Relative: 1 %
HCT: 35.8 % — ABNORMAL LOW (ref 36.0–46.0)
Hemoglobin: 10.6 g/dL — ABNORMAL LOW (ref 12.0–15.0)
Immature Granulocytes: 1 %
Lymphocytes Relative: 11 %
Lymphs Abs: 1.6 K/uL (ref 0.7–4.0)
MCH: 30 pg (ref 26.0–34.0)
MCHC: 29.6 g/dL — ABNORMAL LOW (ref 30.0–36.0)
MCV: 101.4 fL — ABNORMAL HIGH (ref 80.0–100.0)
Monocytes Absolute: 1 K/uL (ref 0.1–1.0)
Monocytes Relative: 7 %
Neutro Abs: 11.8 K/uL — ABNORMAL HIGH (ref 1.7–7.7)
Neutrophils Relative %: 80 %
Platelets: 333 K/uL (ref 150–400)
RBC: 3.53 MIL/uL — ABNORMAL LOW (ref 3.87–5.11)
RDW: 13.5 % (ref 11.5–15.5)
WBC: 14.6 K/uL — ABNORMAL HIGH (ref 4.0–10.5)
nRBC: 0 % (ref 0.0–0.2)

## 2024-01-20 LAB — URINALYSIS, ROUTINE W REFLEX MICROSCOPIC
Bilirubin Urine: NEGATIVE
Glucose, UA: >=500 mg/dL — AB
Hgb urine dipstick: NEGATIVE
Ketones, ur: NEGATIVE mg/dL
Nitrite: NEGATIVE
Protein, ur: NEGATIVE mg/dL
Specific Gravity, Urine: 1.018 (ref 1.005–1.030)
pH: 6 (ref 5.0–8.0)

## 2024-01-20 LAB — COMPREHENSIVE METABOLIC PANEL WITH GFR
ALT: 26 U/L (ref 0–44)
AST: 24 U/L (ref 15–41)
Albumin: 3.4 g/dL — ABNORMAL LOW (ref 3.5–5.0)
Alkaline Phosphatase: 60 U/L (ref 38–126)
Anion gap: 9 (ref 5–15)
BUN: 8 mg/dL (ref 8–23)
CO2: 34 mmol/L — ABNORMAL HIGH (ref 22–32)
Calcium: 8.5 mg/dL — ABNORMAL LOW (ref 8.9–10.3)
Chloride: 98 mmol/L (ref 98–111)
Creatinine, Ser: 0.78 mg/dL (ref 0.44–1.00)
GFR, Estimated: >60 mL/min (ref >60)
Glucose, Bld: 123 mg/dL — ABNORMAL HIGH (ref 70–99)
Potassium: 4.5 mmol/L (ref 3.5–5.1)
Sodium: 141 mmol/L (ref 135–145)
Total Bilirubin: 0.6 mg/dL (ref 0.0–1.2)
Total Protein: 6.5 g/dL (ref 6.5–8.1)

## 2024-01-20 LAB — CBG MONITORING, ED: Glucose-Capillary: 116 mg/dL — ABNORMAL HIGH (ref 70–99)

## 2024-01-20 LAB — TROPONIN I (HIGH SENSITIVITY): Troponin I (High Sensitivity): 5 ng/L (ref <18)

## 2024-01-20 LAB — BRAIN NATRIURETIC PEPTIDE: B Natriuretic Peptide: 73 pg/mL (ref 0.0–100.0)

## 2024-01-20 MED ORDER — HYDROCODONE-ACETAMINOPHEN 5-325 MG PO TABS
1.0000 | ORAL_TABLET | Freq: Once | ORAL | Status: AC
  Administered 2024-01-20: 1 via ORAL
  Filled 2024-01-20: qty 1

## 2024-01-20 MED ORDER — IPRATROPIUM-ALBUTEROL 0.5-2.5 (3) MG/3ML IN SOLN
3.0000 mL | Freq: Once | RESPIRATORY_TRACT | Status: AC
  Administered 2024-01-20: 3 mL via RESPIRATORY_TRACT
  Filled 2024-01-20: qty 3

## 2024-01-20 MED ORDER — IPRATROPIUM-ALBUTEROL 0.5-2.5 (3) MG/3ML IN SOLN: 3.0000 mL | RESPIRATORY_TRACT | 2 refills | Status: DC | PRN

## 2024-01-20 NOTE — Discharge Instructions (Addendum)
 [facility] follow-up with pulmonology 10/14.  Please fill your prescription for DuoNebs and take as needed as prescribed.  Please follow-up with gynecology referral.  Please return to the ED if you experience new or worsening symptoms including worsening chest pain, shortness of breath, inability to tolerate oral intake.

## 2024-01-20 NOTE — ED Provider Notes (Signed)
 [place] [facility]GENCY DEPARTMENT AT [facility] Provider Note   CSN: [id number] Arrival date & time: 01/20/24  2031     Patient presents with: Dysuria   Michele Owens is a 63 y.o. female.    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  Patient reports subjective fever since this time.  Patient denies headache, vision changes, focal weakness/sensation changes, nausea, vomiting, back pain, chest pain, and changes in bowel movements.  She denies increased thirst.  No recent falls or trauma.  Of note, patient recently presented to the ED 01/16/2024 with similar presentation of dyspnea and increased urinary frequency, with CTA PE performed, without evidence of acute pulmonary embolus, subsequently prescribed Pyridium with concern for interstitial cystitis.  In addition, patient presented to the ED 01/15/2024 with dyspnea, at which time she was initiated on Keflex  500 mg 3 times daily for boil of RLQ abdomen.   Prior to Admission medications   Medication Sig Start Date End Date Taking? Authorizing Provider  ipratropium-albuterol  (DUONEB) 0.5-2.5 (3) MG/3ML SOLN Take 3 mLs by nebulization every 4 (four) hours as needed. 01/20/24  Yes Ima Hafner, Elsie, MD  Accu-Chek Softclix Lancets lancets Use to check blood sugar 3 times daily 12/26/23   [name], MD  ACETAMINOPHEN  PO Take 1,300-2,050 mg by mouth daily as needed for moderate pain (pain score 4-6) or headache.    [provider]  albuterol  (VENTOLIN  HFA) 108 (90 Base) MCG/ACT inhaler Inhale 1-2 puffs into the lungs every 6 (six) hours as needed for wheezing  or shortness of breath. 12/26/23   [name], MD  allopurinol  (ZYLOPRIM ) 100 MG tablet Take 1 tablet (100 mg total) by mouth daily. 11/13/23 11/07/24  [name], MD  aspirin  EC 81 MG tablet Take 1 tablet (81 mg total) by mouth daily. Swallow whole. 11/13/23 11/07/24  [name], MD  blood glucose meter kit and supplies KIT Dispense based on patient and insurance preference. Use up to four times daily as directed. 12/25/20   Lue Elsie BROCKS, MD  Blood Glucose Monitoring Suppl DEVI 1 each by Does not apply route in the morning, at noon, and at bedtime. May substitute to any manufacturer covered by patient's insurance. 11/13/23   [name], MD  budesonide  (PULMICORT ) 0.25 MG/2ML nebulizer solution Inhale 2 mLs (0.25 mg total) by nebulization 2 (two) times daily. 11/13/23   [name], MD  busPIRone  (BUSPAR ) 30 MG tablet Take 1 tablet (30 mg total) by mouth 2 (two) times daily. 12/05/23 11/29/24  [name], MD  carvedilol  (COREG ) 6.25 MG tablet Take 1 tablet (6.25 mg total) by mouth 2 (two) times daily. 12/26/23   [name], MD  cephALEXin  (KEFLEX ) 500 MG capsule Take 1 capsule (500 mg total) by mouth 3 (three) times daily for 7 days. 01/15/24 01/22/24  Theadore Ozell HERO, MD  clotrimazole  (LOTRIMIN ) 1 % cream Apply topically 2 (two) times daily. 12/13/23   [name], MD  diclofenac  Sodium (VOLTAREN ) 1 % GEL Apply 4 g topically 4 (four) times daily. 12/15/23   Freddi Hamilton, MD  dicyclomine  (BENTYL ) 20 MG tablet Take 1 tablet (20 mg  total) by mouth 2 (two) times daily. 12/26/23   [name], MD  doxepin  (SINEQUAN ) 10 MG capsule Take 1 capsule (10 mg total) by mouth at bedtime as needed (insomnia). 01/02/24 04/01/24  [name], MD  EPINEPHrine  0.3 mg/0.3 mL IJ SOAJ injection Inject 0.3 mg into the muscle once as needed for anaphylaxis.    [provider]  FARXIGA  10 MG TABS tablet Take 1 tablet (10 mg total) by mouth daily before breakfast.  11/06/23   [name], MD  fenofibrate  (TRICOR ) 145 MG tablet Take 1 tablet (145 mg total) by mouth daily. 12/26/23   [name], MD  FEROSUL 325 (65 Fe) MG tablet Take 1 tablet (325 mg total) by mouth daily. 12/26/23   [name], MD  gabapentin (NEURONTIN) 100 MG capsule Take 1 capsule 3 times a day by oral route as needed for 30 days. 12/06/23   [provider]  glucose blood (ACCU-CHEK GUIDE TEST) test strip Use to check blood sugar 3 times daily 12/26/23   [name], MD  levocetirizine (XYZAL ) 5 MG tablet TAKE 1 TABLET (5 MG TOTAL) BY MOUTH AT BEDTIME. 12/17/23   [name], MD  levothyroxine  (SYNTHROID ) 125 MCG tablet Take 1 tablet (125 mcg total) by mouth at bedtime. 01/02/24   [name], MD  losartan  (COZAAR ) 25 MG tablet Take 1 tablet (25 mg total) by mouth daily. 11/13/23   [name], MD  lurasidone  (LATUDA ) 20 MG TABS tablet Take 1 tablet (20 mg total) by mouth at bedtime. Take with a snack. This is for mood and sleep. 12/25/23 12/19/24  [name], MD  nitroGLYCERIN  (NITROSTAT ) 0.4 MG SL tablet Place 1 tablet (0.4 mg total) under the tongue every 5 (five) minutes as needed for chest pain. 12/25/23   [name], MD  omeprazole  (PRILOSEC) 20 MG capsule Take 2 capsules (40 mg total) by mouth daily. 11/06/23   [name], MD  ondansetron  (ZOFRAN -ODT) 4 MG disintegrating tablet Take 1 tablet (4 mg total) by mouth every 8 (eight) hours as needed for nausea or vomiting. 12/26/23   [name], MD  OXYGEN  Inhale 2 L into the lungs continuous.    [provider]  phenazopyridine (PYRIDIUM) 200 MG tablet Take 1 tablet (200 mg total) by mouth 3 (three) times daily. 01/16/24   Dreama Longs, MD  potassium chloride  (KLOR-CON ) 10 MEQ tablet Take 1 tablet (10 mEq total) by mouth 2 (two) times daily. 11/13/23   [name], MD  Rimegepant Sulfate (NURTEC) 75 MG TBDP Take 1 tablet (75 mg total) by mouth every other day.  Prevents and Treats Migraine Headaches Patient not taking: Reported on 12/31/2023 12/25/23 12/19/24  [name], MD  rizatriptan (MAXALT) 10 MG tablet Take 1 tablet (10 mg total) by mouth as needed for migraine. May repeat in 2 hours if needed 01/06/24   [name], MD  rosuvastatin  (CRESTOR ) 10 MG tablet Take 1 tablet (10 mg total) by mouth every evening. 11/13/23   [name], MD  torsemide  (DEMADEX ) 20 MG tablet Take 2 tablets (40 mg total) by mouth 2 (two) times daily. 11/13/23   [name], MD  umeclidinium-vilanterol (ANORO ELLIPTA ) 62.5-25 MCG/ACT AEPB Inhale 1 puff into the lungs daily. 01/02/24   [name], MD  Vitamin D , Ergocalciferol , (DRISDOL ) 1.25 MG (50000 UNIT) CAPS capsule Take 1 capsule (50,000 Units total) by mouth every 7 (seven) days. 11/13/23  [name], MD    Allergies: Bee venom, Fioricet  [butalbital -apap-caffeine ], Ibuprofen , Lamisil  [terbinafine ], and Nsaids    Review of Systems  Genitourinary:  Positive for dysuria.    Updated Vital Signs BP 129/64   Pulse 63   Temp 98 F (36.7 C)   Resp 20   Wt (!) 138.3 kg   SpO2 92% Comment: on home O2 2L  BMI 50.75 kg/m   Physical Exam HENT:     Head: Normocephalic and atraumatic.     Right Ear: Ear canal and external ear normal.     Left Ear: Ear canal and external ear normal.     Nose: Nose normal.     Mouth/Throat:     Mouth: Mucous membranes are moist.     Pharynx: Oropharynx is clear.  Eyes:     Extraocular Movements: Extraocular movements intact.     Conjunctiva/sclera: Conjunctivae normal.     Pupils: Pupils are equal, round, and reactive to light.  Cardiovascular:     Rate and Rhythm: Normal rate and regular rhythm.     Pulses: Normal pulses.     Heart sounds: Normal heart sounds.  Pulmonary:     Effort: No tachypnea.     Breath sounds: No stridor or transmitted upper airway sounds. No wheezing or rhonchi.     Comments: Diminished breath sounds  bilaterally Abdominal:     Tenderness: There is no abdominal tenderness. There is no guarding or rebound.  Musculoskeletal:        General: Normal range of motion.     Cervical back: Normal range of motion.  Skin:    General: Skin is warm and dry.     Capillary Refill: Capillary refill takes less than 2 seconds.  Neurological:     General: No focal deficit present.     Mental Status: She is alert and oriented to person, place, and time.     (all labs ordered are listed, but only abnormal results are displayed) Labs Reviewed  CBC WITH DIFFERENTIAL/PLATELET - Abnormal; Notable for the following components:      Result Value   WBC 14.6 (*)    RBC 3.53 (*)    Hemoglobin 10.6 (*)    HCT 35.8 (*)    MCV 101.4 (*)    MCHC 29.6 (*)    Neutro Abs 11.8 (*)    Abs Immature Granulocytes 0.12 (*)    All other components within normal limits  COMPREHENSIVE METABOLIC PANEL WITH GFR - Abnormal; Notable for the following components:   CO2 34 (*)    Glucose, Bld 123 (*)    Calcium  8.5 (*)    Albumin  3.4 (*)    All other components within normal limits  URINALYSIS, ROUTINE W REFLEX MICROSCOPIC - Abnormal; Notable for the following components:   Glucose, UA >=500 (*)    Leukocytes,Ua SMALL (*)    Bacteria, UA RARE (*)    All other components within normal limits  CBG MONITORING, ED - Abnormal; Notable for the following components:   Glucose-Capillary 116 (*)    All other components within normal limits  BRAIN NATRIURETIC PEPTIDE  I-STAT VENOUS BLOOD GAS, ED  TROPONIN I (HIGH SENSITIVITY)  TROPONIN I (HIGH SENSITIVITY)    EKG: None  Radiology: DG Chest Portable 1 View Result Date: 01/20/2024 CLINICAL DATA:  Shortness of breath EXAM: PORTABLE CHEST 1 VIEW COMPARISON:  01/15/2024 FINDINGS: Cardiac shadow is prominent similar to that seen on the prior exam. Overlying soft tissue density is noted.  The lungs are clear bilaterally. No bony abnormality is seen. Changes of prior  thyroidectomy are noted. No nodular lesions are not well appreciated on this exam. IMPRESSION: Prominent overlying soft tissue.  No focal abnormality is noted. Electronically Signed   By: Oneil Devonshire M.D.   On: 01/20/2024 21:17     Procedures   Medications Ordered in the ED  HYDROcodone -acetaminophen  (NORCO/VICODIN) 5-325 MG per tablet 1 tablet (has no administration in time range)  ipratropium-albuterol  (DUONEB) 0.5-2.5 (3) MG/3ML nebulizer solution 3 mL (3 mLs Nebulization Given 01/20/24 2059)                                    Medical Decision Making Amount and/or Complexity of Data Reviewed Labs: ordered. Radiology: ordered.  Risk Prescription drug management.   63 year old female PMH as above presenting to the ED for dyspnea and increased urinary frequency in the setting of recent ED presentations 10/07 and 01/17/2024 treated with Keflex  for soft tissue infection and Pyridium for presumed interstitial cystitis, notably with CTA PE study 01/16/2024 without evidence of acute pulmonary embolus.  Patient denying acute infectious symptoms.  On arrival, patient hemodynamically stable.  Normotensive.  Afebrile.  No tachycardia or tachypnea.  Saturating 97% on RA.  Patient initially on 4 L [place], titrated to 2 L [place] baseline.  No clinical evidence of increased work of breathing/respiratory insufficiency at this time.  Lung sounds distant with concern for need for bronchodilator, however auscultation limited by body habitus.  Patient given DuoNeb with improvement in air entry bilaterally.  No focal lung sounds including wheezing or rhonchi, chest x-ray without evidence of focal consolidation, low suspicion for acute infectious process including pneumonia at this time.  Further to this point, patient denies any acute infectious symptoms.  Given absence of productive cough or change in sputum production, as well as absence of new onset respiratory insufficiency, doubt aeCOPD at this time.  Notably,  patient with upcoming pulmonology appointment 10/14.  UA reviewed without convincing evidence of infection.  Patient denying dysuria or hematuria, however endorsing continued increased urinary frequency, recently diagnosed with interstitial cystitis and prescribed Pyridium.  Given that patient has been on antibiotics/Keflex  since 10/06, without history of recurrent UTIs, low suspicion for infectious cystitis at this time.  Favored etiology is ongoing interstitial cystitis.  Discussed with patient and gynecology referral included with AVS.  Troponin and BNP within normal limits.  CMP without evidence of acute renal impairment/AKI.  CBC with neutrophil predominant leukocytosis, however unchanged from baseline times several months.  Patient is at neurologic baseline, without clinical evidence of acute respiratory insufficiency, afebrile and well-appearing, patient is appropriate for discharge at this time.  All questions answered at bedside.  Strict return precautions discussed.   Final diagnoses:  Shortness of breath    ED Discharge Orders          Ordered    ipratropium-albuterol  (DUONEB) 0.5-2.5 (3) MG/3ML SOLN  Every 4 hours PRN        01/20/24 2251               Jarris Kortz, Elsie, MD 01/20/24 2302    Pamella Ozell LABOR, DO 01/23/24 [phone]

## 2024-01-20 NOTE — ED Triage Notes (Signed)
 Pt BIB [facility] fro home d/t urinary incontinence.  Pt was diagnosed with UTI 7 days ago and supposed to be on Abx but EMS stated she was non-compliant with meds.    92% on 4L 22 RR BP 134/68 HR 77

## 2024-01-20 NOTE — ED Notes (Signed)
 Ptar called

## 2024-01-21 ENCOUNTER — Inpatient Hospital Stay (HOSPITAL_COMMUNITY)
Admission: EM | Admit: 2024-01-21 | Discharge: 2024-01-23 | DRG: 291 | Disposition: A | Discharge status: Home Health | Attending: Internal Medicine | Admitting: Internal Medicine

## 2024-01-21 ENCOUNTER — Emergency Department (HOSPITAL_COMMUNITY)

## 2024-01-21 ENCOUNTER — Ambulatory Visit: Payer: Self-pay

## 2024-01-21 ENCOUNTER — Other Ambulatory Visit: Payer: Self-pay

## 2024-01-21 ENCOUNTER — Encounter (HOSPITAL_COMMUNITY): Payer: Self-pay

## 2024-01-21 DIAGNOSIS — I2781 Cor pulmonale (chronic): Secondary | ICD-10-CM | POA: Diagnosis present

## 2024-01-21 DIAGNOSIS — E890 Postprocedural hypothyroidism: Secondary | ICD-10-CM | POA: Diagnosis present

## 2024-01-21 DIAGNOSIS — Z7951 Long term (current) use of inhaled steroids: Secondary | ICD-10-CM | POA: Diagnosis not present

## 2024-01-21 DIAGNOSIS — Z833 Family history of diabetes mellitus: Secondary | ICD-10-CM

## 2024-01-21 DIAGNOSIS — Z91128 Patient's intentional underdosing of medication regimen for other reason: Secondary | ICD-10-CM | POA: Diagnosis not present

## 2024-01-21 DIAGNOSIS — I10 Essential (primary) hypertension: Secondary | ICD-10-CM | POA: Diagnosis not present

## 2024-01-21 DIAGNOSIS — I272 Pulmonary hypertension, unspecified: Secondary | ICD-10-CM | POA: Diagnosis present

## 2024-01-21 DIAGNOSIS — Z9981 Dependence on supplemental oxygen: Secondary | ICD-10-CM | POA: Diagnosis not present

## 2024-01-21 DIAGNOSIS — E7849 Other hyperlipidemia: Secondary | ICD-10-CM | POA: Diagnosis present

## 2024-01-21 DIAGNOSIS — R079 Chest pain, unspecified: Secondary | ICD-10-CM | POA: Diagnosis present

## 2024-01-21 DIAGNOSIS — J449 Chronic obstructive pulmonary disease, unspecified: Secondary | ICD-10-CM | POA: Diagnosis present

## 2024-01-21 DIAGNOSIS — M109 Gout, unspecified: Secondary | ICD-10-CM | POA: Diagnosis present

## 2024-01-21 DIAGNOSIS — Z6841 Body Mass Index (BMI) 40.0 and over, adult: Secondary | ICD-10-CM

## 2024-01-21 DIAGNOSIS — Z79890 Hormone replacement therapy (postmenopausal): Secondary | ICD-10-CM

## 2024-01-21 DIAGNOSIS — I5033 Acute on chronic diastolic (congestive) heart failure: Secondary | ICD-10-CM | POA: Diagnosis present

## 2024-01-21 DIAGNOSIS — I110 Hypertensive heart disease with heart failure: Secondary | ICD-10-CM | POA: Diagnosis present

## 2024-01-21 DIAGNOSIS — T501X6A Underdosing of loop [high-ceiling] diuretics, initial encounter: Secondary | ICD-10-CM | POA: Diagnosis present

## 2024-01-21 DIAGNOSIS — J441 Chronic obstructive pulmonary disease with (acute) exacerbation: Secondary | ICD-10-CM | POA: Diagnosis present

## 2024-01-21 DIAGNOSIS — E039 Hypothyroidism, unspecified: Secondary | ICD-10-CM | POA: Diagnosis present

## 2024-01-21 DIAGNOSIS — Z86718 Personal history of other venous thrombosis and embolism: Secondary | ICD-10-CM

## 2024-01-21 DIAGNOSIS — E662 Morbid (severe) obesity with alveolar hypoventilation: Secondary | ICD-10-CM | POA: Diagnosis present

## 2024-01-21 DIAGNOSIS — N3010 Interstitial cystitis (chronic) without hematuria: Secondary | ICD-10-CM | POA: Diagnosis present

## 2024-01-21 DIAGNOSIS — I509 Heart failure, unspecified: Secondary | ICD-10-CM | POA: Diagnosis present

## 2024-01-21 DIAGNOSIS — E66813 Obesity, class 3: Secondary | ICD-10-CM | POA: Diagnosis present

## 2024-01-21 DIAGNOSIS — J9611 Chronic respiratory failure with hypoxia: Secondary | ICD-10-CM | POA: Diagnosis present

## 2024-01-21 DIAGNOSIS — Z7982 Long term (current) use of aspirin: Secondary | ICD-10-CM | POA: Diagnosis not present

## 2024-01-21 DIAGNOSIS — G43909 Migraine, unspecified, not intractable, without status migrainosus: Secondary | ICD-10-CM | POA: Diagnosis present

## 2024-01-21 DIAGNOSIS — I493 Ventricular premature depolarization: Secondary | ICD-10-CM | POA: Diagnosis present

## 2024-01-21 DIAGNOSIS — Z79899 Other long term (current) drug therapy: Secondary | ICD-10-CM

## 2024-01-21 DIAGNOSIS — F419 Anxiety disorder, unspecified: Secondary | ICD-10-CM | POA: Diagnosis present

## 2024-01-21 DIAGNOSIS — Z85850 Personal history of malignant neoplasm of thyroid: Secondary | ICD-10-CM

## 2024-01-21 DIAGNOSIS — G43E09 Chronic migraine with aura, not intractable, without status migrainosus: Secondary | ICD-10-CM

## 2024-01-21 DIAGNOSIS — Z87891 Personal history of nicotine dependence: Secondary | ICD-10-CM

## 2024-01-21 DIAGNOSIS — E119 Type 2 diabetes mellitus without complications: Secondary | ICD-10-CM | POA: Insufficient documentation

## 2024-01-21 DIAGNOSIS — Z886 Allergy status to analgesic agent status: Secondary | ICD-10-CM

## 2024-01-21 DIAGNOSIS — F319 Bipolar disorder, unspecified: Secondary | ICD-10-CM | POA: Diagnosis present

## 2024-01-21 DIAGNOSIS — E785 Hyperlipidemia, unspecified: Secondary | ICD-10-CM | POA: Diagnosis not present

## 2024-01-21 DIAGNOSIS — G4733 Obstructive sleep apnea (adult) (pediatric): Secondary | ICD-10-CM | POA: Diagnosis present

## 2024-01-21 DIAGNOSIS — I471 Supraventricular tachycardia, unspecified: Secondary | ICD-10-CM | POA: Diagnosis present

## 2024-01-21 DIAGNOSIS — E1169 Type 2 diabetes mellitus with other specified complication: Secondary | ICD-10-CM | POA: Diagnosis present

## 2024-01-21 DIAGNOSIS — J9621 Acute and chronic respiratory failure with hypoxia: Secondary | ICD-10-CM | POA: Diagnosis present

## 2024-01-21 DIAGNOSIS — Z90710 Acquired absence of both cervix and uterus: Secondary | ICD-10-CM

## 2024-01-21 DIAGNOSIS — K219 Gastro-esophageal reflux disease without esophagitis: Secondary | ICD-10-CM | POA: Diagnosis present

## 2024-01-21 LAB — CBC WITH DIFFERENTIAL/PLATELET
Abs Immature Granulocytes: 0.11 K/uL — ABNORMAL HIGH (ref 0.00–0.07)
Basophils Absolute: 0 K/uL (ref 0.0–0.1)
Basophils Relative: 0 %
Eosinophils Absolute: 0.2 K/uL (ref 0.0–0.5)
Eosinophils Relative: 2 %
HCT: 36.2 % (ref 36.0–46.0)
Hemoglobin: 11.1 g/dL — ABNORMAL LOW (ref 12.0–15.0)
Immature Granulocytes: 1 %
Lymphocytes Relative: 11 %
Lymphs Abs: 1.7 K/uL (ref 0.7–4.0)
MCH: 30.2 pg (ref 26.0–34.0)
MCHC: 30.7 g/dL (ref 30.0–36.0)
MCV: 98.4 fL (ref 80.0–100.0)
Monocytes Absolute: 1 K/uL (ref 0.1–1.0)
Monocytes Relative: 7 %
Neutro Abs: 12.4 K/uL — ABNORMAL HIGH (ref 1.7–7.7)
Neutrophils Relative %: 79 %
Platelets: 339 K/uL (ref 150–400)
RBC: 3.68 MIL/uL — ABNORMAL LOW (ref 3.87–5.11)
RDW: 13.4 % (ref 11.5–15.5)
WBC: 15.4 K/uL — ABNORMAL HIGH (ref 4.0–10.5)
nRBC: 0 % (ref 0.0–0.2)

## 2024-01-21 LAB — I-STAT VENOUS BLOOD GAS, ED
Acid-Base Excess: 15 mmol/L — ABNORMAL HIGH (ref 0.0–2.0)
Bicarbonate: 41.8 mmol/L — ABNORMAL HIGH (ref 20.0–28.0)
Calcium, Ion: 1.04 mmol/L — ABNORMAL LOW (ref 1.15–1.40)
HCT: 36 % (ref 36.0–46.0)
Hemoglobin: 12.2 g/dL (ref 12.0–15.0)
O2 Saturation: 92 %
Potassium: 4.4 mmol/L (ref 3.5–5.1)
Sodium: 139 mmol/L (ref 135–145)
TCO2: 44 mmol/L — ABNORMAL HIGH (ref 22–32)
pCO2, Ven: 58.2 mmHg (ref 44–60)
pH, Ven: 7.463 — ABNORMAL HIGH (ref 7.25–7.43)
pO2, Ven: 62 mmHg — ABNORMAL HIGH (ref 32–45)

## 2024-01-21 LAB — COMPREHENSIVE METABOLIC PANEL WITH GFR
ALT: 24 U/L (ref 0–44)
AST: 27 U/L (ref 15–41)
Albumin: 3.6 g/dL (ref 3.5–5.0)
Alkaline Phosphatase: 64 U/L (ref 38–126)
Anion gap: 11 (ref 5–15)
BUN: 9 mg/dL (ref 8–23)
CO2: 34 mmol/L — ABNORMAL HIGH (ref 22–32)
Calcium: 8.5 mg/dL — ABNORMAL LOW (ref 8.9–10.3)
Chloride: 93 mmol/L — ABNORMAL LOW (ref 98–111)
Creatinine, Ser: 0.86 mg/dL (ref 0.44–1.00)
GFR, Estimated: >60 mL/min (ref >60)
Glucose, Bld: 140 mg/dL — ABNORMAL HIGH (ref 70–99)
Potassium: 4 mmol/L (ref 3.5–5.1)
Sodium: 138 mmol/L (ref 135–145)
Total Bilirubin: 0.9 mg/dL (ref 0.0–1.2)
Total Protein: 6.7 g/dL (ref 6.5–8.1)

## 2024-01-21 LAB — BRAIN NATRIURETIC PEPTIDE: B Natriuretic Peptide: 61.6 pg/mL (ref 0.0–100.0)

## 2024-01-21 LAB — TROPONIN I (HIGH SENSITIVITY)
Troponin I (High Sensitivity): 6 ng/L (ref <18)
Troponin I (High Sensitivity): 210 ng/L (ref <18)
Troponin I (High Sensitivity): 210 ng/L (ref <18)
Troponin I (High Sensitivity): 6 ng/L (ref <18)
Troponin I (High Sensitivity): 6 ng/L (ref <18)

## 2024-01-21 LAB — RESP PANEL BY RT-PCR (RSV, FLU A&B, COVID)  RVPGX2
Influenza A by PCR: NEGATIVE
Influenza B by PCR: NEGATIVE
Resp Syncytial Virus by PCR: NEGATIVE
SARS Coronavirus 2 by RT PCR: NEGATIVE

## 2024-01-21 LAB — D-DIMER, QUANTITATIVE: D-Dimer, Quant: 0.3 ug{FEU}/mL (ref 0.00–0.50)

## 2024-01-21 LAB — MAGNESIUM: Magnesium: 1.8 mg/dL (ref 1.7–2.4)

## 2024-01-21 LAB — LIPASE, BLOOD: Lipase: 25 U/L (ref 11–51)

## 2024-01-21 LAB — CBG MONITORING, ED: Glucose-Capillary: 282 mg/dL — ABNORMAL HIGH (ref 70–99)

## 2024-01-21 LAB — HIV ANTIBODY (ROUTINE TESTING W REFLEX): HIV Screen 4th Generation wRfx: NONREACTIVE

## 2024-01-21 LAB — TSH: TSH: 35.973 u[IU]/mL — ABNORMAL HIGH (ref 0.350–4.500)

## 2024-01-21 MED ORDER — EPINEPHRINE 0.3 MG/0.3ML IJ SOAJ: 0.3000 mg | Freq: Once | INTRAMUSCULAR | Status: DC | PRN

## 2024-01-21 MED ORDER — HYDRALAZINE HCL 20 MG/ML IJ SOLN: 5.0000 mg | Freq: Four times a day (QID) | INTRAMUSCULAR | Status: DC | PRN

## 2024-01-21 MED ORDER — PHENAZOPYRIDINE HCL 100 MG PO TABS: 200.0000 mg | ORAL_TABLET | Freq: Three times a day (TID) | ORAL | Status: DC

## 2024-01-21 MED ORDER — HEPARIN (PORCINE) 25000 UT/250ML-% IV SOLN
1200.0000 [IU]/h | INTRAVENOUS | Status: DC
  Administered 2024-01-21: 1200 [IU]/h via INTRAVENOUS
  Filled 2024-01-21: qty 250

## 2024-01-21 MED ORDER — ONDANSETRON 4 MG PO TBDP
4.0000 mg | ORAL_TABLET | Freq: Three times a day (TID) | ORAL | Status: DC | PRN
Start: 2024-01-21 — End: 2024-01-21

## 2024-01-21 MED ORDER — IPRATROPIUM-ALBUTEROL 0.5-2.5 (3) MG/3ML IN SOLN
3.0000 mL | RESPIRATORY_TRACT | Status: DC | PRN
  Administered 2024-01-21 – 2024-01-23 (×2): 3 mL via RESPIRATORY_TRACT
  Filled 2024-01-21 (×2): qty 3

## 2024-01-21 MED ORDER — INSULIN ASPART 100 UNIT/ML IJ SOLN
0.0000 [IU] | Freq: Every day | INTRAMUSCULAR | Status: DC
  Administered 2024-01-21: 3 [IU] via SUBCUTANEOUS

## 2024-01-21 MED ORDER — ENOXAPARIN SODIUM 40 MG/0.4ML IJ SOSY: 40.0000 mg | PREFILLED_SYRINGE | INTRAMUSCULAR | Status: DC

## 2024-01-21 MED ORDER — IPRATROPIUM-ALBUTEROL 0.5-2.5 (3) MG/3ML IN SOLN
3.0000 mL | Freq: Once | RESPIRATORY_TRACT | Status: AC
  Administered 2024-01-21: 3 mL via RESPIRATORY_TRACT
  Filled 2024-01-21: qty 3

## 2024-01-21 MED ORDER — MAGNESIUM SULFATE 2 GM/50ML IV SOLN
2.0000 g | Freq: Once | INTRAVENOUS | Status: AC
  Administered 2024-01-21: 2 g via INTRAVENOUS
  Filled 2024-01-21: qty 50

## 2024-01-21 MED ORDER — BUSPIRONE HCL 10 MG PO TABS
30.0000 mg | ORAL_TABLET | Freq: Two times a day (BID) | ORAL | Status: DC
  Administered 2024-01-21 – 2024-01-23 (×4): 30 mg via ORAL
  Filled 2024-01-21 (×4): qty 3

## 2024-01-21 MED ORDER — AZITHROMYCIN 250 MG PO TABS: 500.0000 mg | ORAL_TABLET | Freq: Every day | ORAL | Status: DC

## 2024-01-21 MED ORDER — FENOFIBRATE 160 MG PO TABS
160.0000 mg | ORAL_TABLET | Freq: Every day | ORAL | Status: DC
  Administered 2024-01-22 – 2024-01-23 (×2): 160 mg via ORAL
  Filled 2024-01-21 (×2): qty 1

## 2024-01-21 MED ORDER — VITAMIN D (ERGOCALCIFEROL) 1.25 MG (50000 UNIT) PO CAPS
50000.0000 [IU] | ORAL_CAPSULE | ORAL | Status: DC
Start: 2024-01-21 — End: 2024-01-22

## 2024-01-21 MED ORDER — ONDANSETRON HCL 4 MG PO TABS: 4.0000 mg | ORAL_TABLET | Freq: Four times a day (QID) | ORAL | Status: DC | PRN

## 2024-01-21 MED ORDER — INSULIN ASPART 100 UNIT/ML IJ SOLN
0.0000 [IU] | Freq: Three times a day (TID) | INTRAMUSCULAR | Status: DC
  Administered 2024-01-22: 2 [IU] via SUBCUTANEOUS
  Administered 2024-01-22 – 2024-01-23 (×3): 3 [IU] via SUBCUTANEOUS

## 2024-01-21 MED ORDER — FUROSEMIDE 10 MG/ML IJ SOLN
40.0000 mg | Freq: Two times a day (BID) | INTRAMUSCULAR | Status: DC
  Administered 2024-01-21: 40 mg via INTRAVENOUS
  Filled 2024-01-21: qty 4

## 2024-01-21 MED ORDER — DOXEPIN HCL 10 MG PO CAPS: 10.0000 mg | ORAL_CAPSULE | Freq: Every evening | ORAL | Status: DC | PRN

## 2024-01-21 MED ORDER — HEPARIN BOLUS VIA INFUSION
4000.0000 [IU] | Freq: Once | INTRAVENOUS | Status: AC
  Administered 2024-01-21: 4000 [IU] via INTRAVENOUS
  Filled 2024-01-21: qty 4000

## 2024-01-21 MED ORDER — PANTOPRAZOLE SODIUM 40 MG PO TBEC
40.0000 mg | DELAYED_RELEASE_TABLET | Freq: Every day | ORAL | Status: DC
  Administered 2024-01-22 – 2024-01-23 (×2): 40 mg via ORAL
  Filled 2024-01-21 (×2): qty 1

## 2024-01-21 MED ORDER — LEVOTHYROXINE SODIUM 100 MCG PO TABS
125.0000 ug | ORAL_TABLET | Freq: Every day | ORAL | Status: DC
  Administered 2024-01-21 – 2024-01-22 (×2): 125 ug via ORAL
  Filled 2024-01-21 (×2): qty 1

## 2024-01-21 MED ORDER — BUDESONIDE 0.25 MG/2ML IN SUSP
0.2500 mg | Freq: Two times a day (BID) | RESPIRATORY_TRACT | Status: DC
  Administered 2024-01-21 – 2024-01-23 (×4): 0.25 mg via RESPIRATORY_TRACT
  Filled 2024-01-21 (×5): qty 2

## 2024-01-21 MED ORDER — POLYETHYLENE GLYCOL 3350 17 G PO PACK: 17.0000 g | PACK | Freq: Every day | ORAL | Status: DC | PRN

## 2024-01-21 MED ORDER — UMECLIDINIUM-VILANTEROL 62.5-25 MCG/ACT IN AEPB
1.0000 | INHALATION_SPRAY | Freq: Every day | RESPIRATORY_TRACT | Status: DC
  Administered 2024-01-22 – 2024-01-23 (×2): 1 via RESPIRATORY_TRACT
  Filled 2024-01-21: qty 14

## 2024-01-21 MED ORDER — ROSUVASTATIN CALCIUM 5 MG PO TABS
10.0000 mg | ORAL_TABLET | Freq: Every evening | ORAL | Status: DC
Start: 2024-01-21 — End: 2024-01-22
  Administered 2024-01-21: 10 mg via ORAL
  Filled 2024-01-21: qty 2

## 2024-01-21 MED ORDER — LOSARTAN POTASSIUM 25 MG PO TABS
25.0000 mg | ORAL_TABLET | Freq: Every day | ORAL | Status: DC
  Administered 2024-01-22 – 2024-01-23 (×2): 25 mg via ORAL
  Filled 2024-01-21 (×2): qty 1

## 2024-01-21 MED ORDER — GABAPENTIN 100 MG PO CAPS
100.0000 mg | ORAL_CAPSULE | Freq: Three times a day (TID) | ORAL | Status: DC
Start: 2024-01-21 — End: 2024-01-23
  Administered 2024-01-21 – 2024-01-23 (×5): 100 mg via ORAL
  Filled 2024-01-21 (×5): qty 1

## 2024-01-21 MED ORDER — ACETAMINOPHEN 325 MG PO TABS
650.0000 mg | ORAL_TABLET | Freq: Four times a day (QID) | ORAL | Status: DC | PRN
  Administered 2024-01-21 – 2024-01-22 (×2): 650 mg via ORAL
  Filled 2024-01-21 (×2): qty 2

## 2024-01-21 MED ORDER — POTASSIUM CHLORIDE ER 10 MEQ PO TBCR
10.0000 meq | EXTENDED_RELEASE_TABLET | Freq: Two times a day (BID) | ORAL | Status: DC
  Administered 2024-01-21 – 2024-01-23 (×3): 10 meq via ORAL
  Filled 2024-01-21 (×8): qty 1

## 2024-01-21 MED ORDER — ACETAMINOPHEN 650 MG RE SUPP: 650.0000 mg | Freq: Four times a day (QID) | RECTAL | Status: DC | PRN

## 2024-01-21 MED ORDER — PREDNISONE 20 MG PO TABS: 40.0000 mg | ORAL_TABLET | Freq: Every day | ORAL | Status: DC

## 2024-01-21 MED ORDER — ASPIRIN 81 MG PO TBEC
81.0000 mg | DELAYED_RELEASE_TABLET | Freq: Every day | ORAL | Status: DC
  Administered 2024-01-21 – 2024-01-23 (×3): 81 mg via ORAL
  Filled 2024-01-21 (×3): qty 1

## 2024-01-21 MED ORDER — LURASIDONE HCL 20 MG PO TABS
20.0000 mg | ORAL_TABLET | Freq: Every day | ORAL | Status: DC
  Administered 2024-01-21 – 2024-01-22 (×2): 20 mg via ORAL
  Filled 2024-01-21 (×3): qty 1

## 2024-01-21 MED ORDER — SODIUM CHLORIDE 0.9 % IV SOLN: 250.0000 mL | INTRAVENOUS | Status: AC | PRN

## 2024-01-21 MED ORDER — SODIUM CHLORIDE 0.9 % IV SOLN: 500.0000 mg | INTRAVENOUS | Status: DC

## 2024-01-21 MED ORDER — SODIUM CHLORIDE 0.9% FLUSH: 3.0000 mL | INTRAVENOUS | Status: DC | PRN

## 2024-01-21 MED ORDER — ALLOPURINOL 100 MG PO TABS
100.0000 mg | ORAL_TABLET | Freq: Every day | ORAL | Status: DC
Start: 2024-01-22 — End: 2024-01-23
  Administered 2024-01-22 – 2024-01-23 (×2): 100 mg via ORAL
  Filled 2024-01-21 (×2): qty 1

## 2024-01-21 MED ORDER — DICYCLOMINE HCL 20 MG PO TABS
20.0000 mg | ORAL_TABLET | Freq: Two times a day (BID) | ORAL | Status: DC
  Administered 2024-01-21 – 2024-01-23 (×4): 20 mg via ORAL
  Filled 2024-01-21 (×5): qty 1

## 2024-01-21 MED ORDER — FENTANYL CITRATE (PF) 50 MCG/ML IJ SOSY
25.0000 ug | PREFILLED_SYRINGE | Freq: Once | INTRAMUSCULAR | Status: AC
  Administered 2024-01-21: 25 ug via INTRAVENOUS
  Filled 2024-01-21: qty 1

## 2024-01-21 MED ORDER — NITROGLYCERIN 0.4 MG SL SUBL
0.4000 mg | SUBLINGUAL_TABLET | SUBLINGUAL | Status: DC | PRN
Start: 2024-01-21 — End: 2024-01-23

## 2024-01-21 MED ORDER — ONDANSETRON HCL 4 MG/2ML IJ SOLN: 4.0000 mg | Freq: Four times a day (QID) | INTRAMUSCULAR | Status: DC | PRN

## 2024-01-21 MED ORDER — METHYLPREDNISOLONE SODIUM SUCC 40 MG IJ SOLR
40.0000 mg | Freq: Two times a day (BID) | INTRAMUSCULAR | Status: AC
  Administered 2024-01-21 – 2024-01-22 (×2): 40 mg via INTRAVENOUS
  Filled 2024-01-21 (×2): qty 1

## 2024-01-21 MED ORDER — CARVEDILOL 6.25 MG PO TABS
6.2500 mg | ORAL_TABLET | Freq: Two times a day (BID) | ORAL | Status: DC
  Administered 2024-01-21 – 2024-01-23 (×4): 6.25 mg via ORAL
  Filled 2024-01-21: qty 1
  Filled 2024-01-21: qty 2
  Filled 2024-01-21 (×2): qty 1

## 2024-01-21 MED ORDER — METHYLPREDNISOLONE SODIUM SUCC 125 MG IJ SOLR
125.0000 mg | Freq: Once | INTRAMUSCULAR | Status: AC
  Administered 2024-01-21: 125 mg via INTRAVENOUS
  Filled 2024-01-21: qty 2

## 2024-01-21 MED ORDER — SODIUM CHLORIDE 0.9% FLUSH
3.0000 mL | Freq: Two times a day (BID) | INTRAVENOUS | Status: DC
  Administered 2024-01-21 – 2024-01-23 (×3): 3 mL via INTRAVENOUS

## 2024-01-21 MED ORDER — LORATADINE 10 MG PO TABS
10.0000 mg | ORAL_TABLET | Freq: Every day | ORAL | Status: DC
  Administered 2024-01-22: 10 mg via ORAL
  Filled 2024-01-21: qty 1

## 2024-01-21 MED ORDER — CEPHALEXIN 500 MG PO CAPS
500.0000 mg | ORAL_CAPSULE | Freq: Three times a day (TID) | ORAL | Status: AC
  Administered 2024-01-21 – 2024-01-22 (×3): 500 mg via ORAL
  Filled 2024-01-21 (×2): qty 1
  Filled 2024-01-21: qty 2
  Filled 2024-01-21: qty 1

## 2024-01-21 NOTE — Telephone Encounter (Signed)
 Noted. OV scheduled for 01/23/2024 for ED F/U

## 2024-01-21 NOTE — ED Provider Notes (Signed)
 [place] EMERGENCY DEPARTMENT AT [facility] Provider Note   CSN: [id number] Arrival date & time: 01/21/24  1306     Patient presents with: Shortness of Breath   Michele Owens is a 63 y.o. female.  Patient past history significant for type 2 diabetes, hypertension, OSA, obesity hypoventilation syndrome, chronic hypoxic respiratory failure, COPD, CHF, and asthma who presents to the emergency department concerns of shortness of breath.  States that she has had worsening feelings of shortness of breath with exertion and when lying down for the last week.  Also endorses some feelings of urinary incontinence for the last several days.  She was noted to be 71% on room air in transport by EMS but patient wears 2 L nasal cannula at baseline.   Shortness of Breath      Prior to Admission medications   Medication Sig Start Date End Date Taking? Authorizing Provider  Accu-Chek Softclix Lancets lancets Use to check blood sugar 3 times daily 12/26/23   [name], MD  ACETAMINOPHEN  PO Take 1,300-2,050 mg by mouth daily as needed for moderate pain (pain score 4-6) or headache.    [provider]  albuterol  (VENTOLIN  HFA) 108 (90 Base) MCG/ACT inhaler Inhale 1-2 puffs into the lungs every 6 (six) hours as needed for wheezing or shortness of breath. 12/26/23   [name], MD  allopurinol  (ZYLOPRIM ) 100 MG tablet Take 1 tablet (100 mg total) by mouth daily. 11/13/23 11/07/24  [name], MD  aspirin  EC 81 MG tablet Take 1 tablet (81 mg total) by mouth daily. Swallow whole. 11/13/23 11/07/24  [name], MD  blood glucose meter kit and supplies KIT Dispense based on patient and insurance preference. Use up to four times daily as directed. 12/25/20   Lue Elsie BROCKS, MD  Blood Glucose Monitoring Suppl DEVI 1 each by Does not apply route in the morning, at noon, and at bedtime. May substitute to any manufacturer covered by patient's insurance. 11/13/23    [name], MD  budesonide  (PULMICORT ) 0.25 MG/2ML nebulizer solution Inhale 2 mLs (0.25 mg total) by nebulization 2 (two) times daily. 11/13/23   [name], MD  busPIRone  (BUSPAR ) 30 MG tablet Take 1 tablet (30 mg total) by mouth 2 (two) times daily. 12/05/23 11/29/24  [name], MD  carvedilol  (COREG ) 6.25 MG tablet Take 1 tablet (6.25 mg total) by mouth 2 (two) times daily. 12/26/23   [name], MD  cephALEXin  (KEFLEX ) 500 MG capsule Take 1 capsule (500 mg total) by mouth 3 (three) times daily for 7 days. 01/15/24 01/22/24  Theadore Ozell HERO, MD  clotrimazole  (LOTRIMIN ) 1 % cream Apply topically 2 (two) times daily. 12/13/23   [name], MD  diclofenac  Sodium (VOLTAREN ) 1 % GEL Apply 4 g topically 4 (four) times daily. 12/15/23   Freddi Hamilton, MD  dicyclomine  (BENTYL ) 20 MG tablet Take 1 tablet (20 mg total) by mouth 2 (two) times daily. 12/26/23   [name], MD  doxepin  (SINEQUAN ) 10 MG capsule Take 1 capsule (10 mg total) by mouth at bedtime as needed (insomnia). 01/02/24 04/01/24  [name], MD  EPINEPHrine  0.3 mg/0.3 mL IJ SOAJ injection Inject 0.3 mg into the muscle once as needed for anaphylaxis.    [provider]  FARXIGA  10 MG TABS tablet Take 1 tablet (10 mg total) by mouth daily before breakfast. 11/06/23   [name], MD  fenofibrate  (TRICOR ) 145 MG tablet  Take 1 tablet (145 mg total) by mouth daily. 12/26/23   [name], MD  FEROSUL 325 (65 Fe) MG tablet Take 1 tablet (325 mg total) by mouth daily. 12/26/23   [name], MD  gabapentin (NEURONTIN) 100 MG capsule Take 1 capsule 3 times a day by oral route as needed for 30 days. 12/06/23   [provider]  glucose blood (ACCU-CHEK GUIDE TEST) test strip Use to check blood sugar 3 times daily 12/26/23   [name], MD  ipratropium-albuterol  (DUONEB) 0.5-2.5 (3) MG/3ML SOLN Take 3 mLs by nebulization every 4 (four) hours as needed. 01/20/24   Beam,  Elsie, MD  levocetirizine (XYZAL ) 5 MG tablet TAKE 1 TABLET (5 MG TOTAL) BY MOUTH AT BEDTIME. 12/17/23   [name], MD  levothyroxine  (SYNTHROID ) 125 MCG tablet Take 1 tablet (125 mcg total) by mouth at bedtime. 01/02/24   [name], MD  losartan  (COZAAR ) 25 MG tablet Take 1 tablet (25 mg total) by mouth daily. 11/13/23   [name], MD  lurasidone  (LATUDA ) 20 MG TABS tablet Take 1 tablet (20 mg total) by mouth at bedtime. Take with a snack. This is for mood and sleep. 12/25/23 12/19/24  [name], MD  nitroGLYCERIN  (NITROSTAT ) 0.4 MG SL tablet Place 1 tablet (0.4 mg total) under the tongue every 5 (five) minutes as needed for chest pain. 12/25/23   [name], MD  omeprazole  (PRILOSEC) 20 MG capsule Take 2 capsules (40 mg total) by mouth daily. 11/06/23   [name], MD  ondansetron  (ZOFRAN -ODT) 4 MG disintegrating tablet Take 1 tablet (4 mg total) by mouth every 8 (eight) hours as needed for nausea or vomiting. 12/26/23   [name], MD  OXYGEN  Inhale 2 L into the lungs continuous.    [provider]  phenazopyridine (PYRIDIUM) 200 MG tablet Take 1 tablet (200 mg total) by mouth 3 (three) times daily. 01/16/24   Dreama Longs, MD  potassium chloride  (KLOR-CON ) 10 MEQ tablet Take 1 tablet (10 mEq total) by mouth 2 (two) times daily. 11/13/23   [name], MD  Rimegepant Sulfate (NURTEC) 75 MG TBDP Take 1 tablet (75 mg total) by mouth every other day. Prevents and Treats Migraine Headaches Patient not taking: Reported on 12/31/2023 12/25/23 12/19/24  [name], MD  rizatriptan (MAXALT) 10 MG tablet Take 1 tablet (10 mg total) by mouth as needed for migraine. May repeat in 2 hours if needed 01/06/24   [name], MD  rosuvastatin  (CRESTOR ) 10 MG tablet Take 1 tablet (10 mg total) by mouth every evening. 11/13/23   [name], MD  torsemide  (DEMADEX ) 20 MG tablet Take 2 tablets (40 mg total) by mouth 2 (two) times daily. 11/13/23    [name], MD  umeclidinium-vilanterol (ANORO ELLIPTA ) 62.5-25 MCG/ACT AEPB Inhale 1 puff into the lungs daily. 01/02/24   [name], MD  Vitamin D , Ergocalciferol , (DRISDOL ) 1.25 MG (50000 UNIT) CAPS capsule Take 1 capsule (50,000 Units total) by mouth every 7 (seven) days. 11/13/23   [name], MD    Allergies: Bee venom, Fioricet  [butalbital -apap-caffeine ], Ibuprofen , Lamisil  [terbinafine ], and Nsaids    Review of Systems  Respiratory:  Positive for shortness of breath.   All other systems reviewed and are negative.   Updated Vital Signs BP (!) 127/56   Pulse 72   Temp 98.5 F (36.9 C) (Oral)   Resp 18   Ht 5' 5 (  1.651 m)   Wt (!) 142.4 kg   SpO2 95%   BMI 52.25 kg/m   Physical Exam Vitals and nursing note reviewed.  Constitutional:      General: She is not in acute distress.    Appearance: She is well-developed.  HENT:     Head: Normocephalic and atraumatic.  Eyes:     Conjunctiva/sclera: Conjunctivae normal.  Cardiovascular:     Rate and Rhythm: Normal rate and regular rhythm.     Heart sounds: No murmur heard. Pulmonary:     Effort: Pulmonary effort is normal. No respiratory distress.     Breath sounds: Examination of the right-upper field reveals decreased breath sounds. Examination of the left-upper field reveals decreased breath sounds. Examination of the right-middle field reveals decreased breath sounds. Examination of the left-middle field reveals decreased breath sounds. Examination of the right-lower field reveals decreased breath sounds. Examination of the left-lower field reveals decreased breath sounds. Decreased breath sounds and wheezing present. No rhonchi or rales.  Abdominal:     Palpations: Abdomen is soft.     Tenderness: There is no abdominal tenderness.  Musculoskeletal:        General: No swelling.     Cervical back: Neck supple.  Skin:    General: Skin is warm and dry.     Capillary Refill: Capillary refill takes less  than 2 seconds.  Neurological:     Mental Status: She is alert.  Psychiatric:        Mood and Affect: Mood normal.     (all labs ordered are listed, but only abnormal results are displayed) Labs Reviewed  CBC WITH DIFFERENTIAL/PLATELET - Abnormal; Notable for the following components:      Result Value   WBC 15.4 (*)    RBC 3.68 (*)    Hemoglobin 11.1 (*)    Neutro Abs 12.4 (*)    Abs Immature Granulocytes 0.11 (*)    All other components within normal limits  COMPREHENSIVE METABOLIC PANEL WITH GFR - Abnormal; Notable for the following components:   Chloride 93 (*)    CO2 34 (*)    Glucose, Bld 140 (*)    Calcium  8.5 (*)    All other components within normal limits  I-STAT VENOUS BLOOD GAS, ED - Abnormal; Notable for the following components:   pH, Ven 7.463 (*)    pO2, Ven 62 (*)    Bicarbonate 41.8 (*)    TCO2 44 (*)    Acid-Base Excess 15.0 (*)    Calcium , Ion 1.04 (*)    All other components within normal limits  RESP PANEL BY RT-PCR (RSV, FLU A&B, COVID)  RVPGX2  BRAIN NATRIURETIC PEPTIDE  LIPASE, BLOOD  TROPONIN I (HIGH SENSITIVITY)  TROPONIN I (HIGH SENSITIVITY)    EKG: None  Radiology: DG Chest 2 View Result Date: 01/21/2024 CLINICAL DATA:  Worsening shortness of breath. EXAM: CHEST - 2 VIEW COMPARISON:  January 20, 2024 FINDINGS: The cardiac silhouette is enlarged and unchanged in size. There is marked severity calcification of the aortic arch. There is mild, stable prominence of the central pulmonary vasculature. Mild linear scarring and/or atelectasis is seen within the mid right lung and mid to lower left lung. No pleural effusion or pneumothorax is identified. Multilevel degenerative changes are seen throughout the thoracic spine. IMPRESSION: 1. Cardiomegaly with mild prominence of the central pulmonary vasculature. 2. Mild mid right lung and mid to lower left lung linear scarring and/or atelectasis. Electronically Signed   By: Suzen  Houston M.D.   On:  01/21/2024 14:34   DG Chest Portable 1 View Result Date: 01/20/2024 CLINICAL DATA:  Shortness of breath EXAM: PORTABLE CHEST 1 VIEW COMPARISON:  01/15/2024 FINDINGS: Cardiac shadow is prominent similar to that seen on the prior exam. Overlying soft tissue density is noted. The lungs are clear bilaterally. No bony abnormality is seen. 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 No nodular lesions are not well appreciated on this exam. IMPRESSION: Prominent overlying soft tissue.  No focal abnormality is noted. Electronically Signed   By: Oneil Devonshire M.D.   On: 01/20/2024 21:17     Procedures   Medications Ordered in the ED  methylPREDNISolone  sodium succinate (SOLU-MEDROL ) 125 mg/2 mL injection 125 mg (has no administration in time range)  ipratropium-albuterol  (DUONEB) 0.5-2.5 (3) MG/3ML nebulizer solution 3 mL (3 mLs Nebulization Given 01/21/24 1328)  fentaNYL  (SUBLIMAZE ) injection 25 mcg (25 mcg Intravenous Given 01/21/24 1512)                                   Medical Decision Making Amount and/or Complexity of Data Reviewed Labs: ordered. Radiology: ordered.   This patient presents to the ED for concern of shortness of breath, this involves an extensive number of treatment options, and is a complaint that carries with it a high risk of complications and morbidity.  The differential diagnosis includes CHF, pneumonia, bronchitis, COPD, OHS, PE   Co morbidities that complicate the patient evaluation  COPD, OHS, history of VTE not on [facility]   Additional history obtained:  Additional history obtained from patient's prior ED visits from 01/15/2024, 01/17/2024, and 01/20/2024.   Lab Tests:  I Ordered, and personally interpreted labs.  The pertinent results include: CBC with leukocytosis of 15.4, CMP unremarkable, VBG shows pH at 7.46, BNP unremarkable at 61.6, troponin negative at 6, show panel pending   Imaging Studies ordered:  I ordered imaging studies including chest x-ray I  independently visualized and interpreted imaging which showed cardiomegaly with mild prominence of the central pulmonary vasculature. 2. Mild mid right lung and mid to lower left lung linear scarring and/or atelectasis. I agree with the radiologist interpretation   Consultations Obtained:  I requested consultation with none,  and discussed lab and imaging findings as well as pertinent plan - they recommend: N/A   Problem List / ED Course / Critical interventions / Medication management  Patient with past history significant for type 2 diabetes, hypertension, OSA, obesity hypoventilation syndrome, chronic hypoxic respiratory failure, COPD, CHF, and asthma presents emergency department today with concerns of shortness of breath.  Reports has been ongoing for the last week or so as well as some associated urinary discomfort that she describes as incontinence where she is pissing myself.  She denies any fever, chills, body aches, or productive cough.  No reported nausea, vomiting, or diarrhea.  Patient wears 2 L nasal cannula at home at baseline but has elevated up to 4 L.  She was noted to be hypoxic on room air at 71%. On exam, patient's lung sounds difficult to auscultate due to positioning and body habitus.  Appears to have some wheezing in the upper lung field that I can appreciate but no significant crackling.  No diminished lung sounds. Lab workup shows leukocytosis of 15.4 only slightly higher than baseline appears that she is somewhat chronically elevated, CMP unremarkable, VBG shows slight alkalosis at 7.46 and HCO3 elevation at 41.8.  Troponin unremarkable at 6.  BNP normal at 61.6.  Respiratory panel pending. After DuoNeb, patient does report some slight improvement in symptoms.  She does continue endorse abdominal pain has been ongoing for the last several days and has had prior imaging.  Fentanyl  ordered.  Also added on a dose of Solu-Medrol  for suspected COPD component to acute on chronic  hypoxic respiratory failure. Given patient has had prior workup including CT angio imaging to rule out PE, do not feel that there is more emergent workup needed at this time as her troponin is negative, BMP is unremarkable, chest x-ray shows no acute findings suggest infectious etiology.  Given patient's persistent hypoxia on baseline O2, will admit to hospitalist for suspected COPD exacerbation. I ordered medication including DuoNeb, Solu-Medrol , fentanyl  for wheezing, COPD, pain Reevaluation of the patient after these medicines showed that the patient improved I have reviewed the patients home medicines and have made adjustments as needed   3:13 PM Care of  transferred to [facility] and Dr. RONAL Pereyra at the end of my shift as the patient will require reassessment once labs/imaging have resulted. Patient presentation, ED course, and plan of care discussed with review of all pertinent labs and imaging. Please see his/her note for further details regarding further ED course and disposition. Plan at time of handoff is admit for COPD exacerbation, acute on chronic hypoxic respiratory failure. This may be altered or completely changed at the discretion of the oncoming team pending results of further workup.   Social Determinants of Health:  None   Test / Admission - Considered:  Given patient's noted increased O2 demand and hypoxia on baseline 2 L [place], admission required.  Final diagnoses:  Acute on chronic hypoxic respiratory failure (HCC)  COPD with acute exacerbation [facility])    ED Discharge Orders     None          Cecily Legrand LABOR, PA-C 01/21/24 1513    Kammerer, Megan L, DO 01/22/24 1146

## 2024-01-21 NOTE — ED Triage Notes (Signed)
 Pt here for [facility] worse when walking, urinary incontinence for a week. Lungs sounds diminished and wheezing, O2 71% in room air. Hx of COPD, CHF, and asthma. C/O CP as well. Denies n/v. Axox4. Pt usually wears 4L of O2 at home.

## 2024-01-21 NOTE — ED Provider Notes (Signed)
 Care assumed from Oscar Zelaya, PA-C at shift change pending admission.  See his note for full details.  In short, patient is a 63 year old female who presents to the ED due to shortness of breath.  Has been in the ED 4 times over the past week.  History of COPD, CHF, and asthma.  On 2 L nasal cannula at baseline.  At shift change, awaiting hospitalist call back for admission Physical Exam  BP (!) 127/56   Pulse 72   Temp 98.5 F (36.9 C) (Oral)   Resp 18   Ht 5' 5 (1.651 m)   Wt (!) 142.4 kg   SpO2 95%   BMI 52.25 kg/m   Physical Exam Vitals and nursing note reviewed.  Constitutional:      General: She is not in acute distress.    Appearance: She is not ill-appearing.  HENT:     Head: Normocephalic.  Eyes:     Pupils: Pupils are equal, round, and reactive to light.  Cardiovascular:     Rate and Rhythm: Normal rate and regular rhythm.     Pulses: Normal pulses.     Heart sounds: Normal heart sounds. No murmur heard.    No friction rub. No gallop.  Pulmonary:     Effort: Pulmonary effort is normal.     Breath sounds: Wheezing present.  Abdominal:     General: Abdomen is flat. There is no distension.     Palpations: Abdomen is soft.     Tenderness: There is no abdominal tenderness. There is no guarding or rebound.  Musculoskeletal:        General: Normal range of motion.     Cervical back: Neck supple.  Skin:    General: Skin is warm and dry.  Neurological:     General: No focal deficit present.     Mental Status: She is alert.  Psychiatric:        Mood and Affect: Mood normal.        Behavior: Behavior normal.     Procedures  .Critical Care  Performed by: Lorelle Aleck BROCKS, PA-C Authorized by: Lorelle Aleck BROCKS, PA-C   Critical care provider statement:    Critical care time (minutes):  33   Critical care was necessary to treat or prevent imminent or life-threatening deterioration of the following conditions:  Cardiac failure and respiratory failure    Critical care was time spent personally by me on the following activities:  Development of treatment plan with patient or surrogate, discussions with consultants, evaluation of patient's response to treatment, examination of patient, ordering and review of laboratory studies, ordering and review of radiographic studies, ordering and performing treatments and interventions, pulse oximetry, re-evaluation of patient's condition and review of old charts   I assumed direction of critical care for this patient from another provider in my specialty: no     Care discussed with: admitting provider     ED Course / MDM   Clinical Course as of 01/21/24 1745  Mon Jan 21, 2024  1722 Troponin I (High Sensitivity)(!!): 210 [CA]  1722 Lipase: 25 [CA]    Clinical Course User Index [CA] Lorelle Aleck BROCKS, PA-C   Medical Decision Making Amount and/or Complexity of Data Reviewed Labs: ordered. Decision-making details documented in ED Course. Radiology: ordered and independent interpretation performed. Decision-making details documented in ED Course. ECG/medicine tests: ordered and independent interpretation performed. Decision-making details documented in ED Course.  Risk Prescription drug management. Decision regarding hospitalization.   Care assumed  from Oscar Zelaya, PA-C at shift change pending admission.  See his note for full details.  63 year old female presents to the ED due to worsening shortness of breath.  Currently on 6 L nasal cannula.  On 2 L at baseline.  Presents with wheezing and decreased breath sounds.  Had a CTA chest on 10/8 which was negative for PE however, did demonstrate multiple nodules which have increased in size.  Discussed with Dr. Royal with [facility] who recommends ambulating patient with pulse ox and wait for lipase results then reconsult hospitalist for admission.   4:23 PM informed by RN that patient's second troponin is elevated at 210.  Reported to bedside.  Patient  states she just developed some right shoulder pain.  Had a CTA chest on 10/8 which was negative for PE.  Repeat EKG normal sinus rhythm with no signs of acute ischemia.  Added D-dimer.  Will discuss with cardiology.  4:32 PM Discussed with cardiology who will see patient.   D-dimer normal. CTA chest negative for PE on 10/8. Low suspicion for PE. Discussed with cardiology who recommends trending troponins and starting heparin . Order placed by cardiology. Will discuss with hospitalist for admission.   5:51 PM Discussed with Dr. Sonjia with [facility] who agrees to admit patient  Co morbidities that complicate the patient evaluation  HTN, DM Cardiac Monitoring: / EKG:  The patient was maintained on a cardiac monitor.  I personally viewed and interpreted the cardiac monitored which showed an underlying rhythm of: NSR  Social Determinants of Health:  Obesity  Test / Admission - Considered:  Patient will require admission for COPD exacerbation and elevated troponins.      Lorelle Aleck BROCKS, PA-C 01/21/24 1754    Elnor Bernarda SQUIBB, DO 02/01/24 1341

## 2024-01-21 NOTE — Consult Note (Signed)
 Cardiology Consultation   Patient ID: Michele Owens MRN: [id number]; DOB: Nov 25, 1960  Admit date: 01/21/2024 Date of Consult: 01/21/2024  PCP:  [name], MD   [facility] Providers Cardiologist:  Ezra Shuck, MD  Electrophysiologist:  OLE ONEIDA HOLTS, MD  Advanced Heart Failure:  Ezra Shuck, MD       Patient Profile: Michele Owens is a 63 y.o. female with a hx of DM2, HTN, COPD,  who is being seen 01/21/2024 for the evaluation of CHF exacerbation at the request of Dr. Elnor.  History of Present Illness:  Michele Owens has a history of COPD with home O2, HTN, obesity, OSA, SVT (converted with adenosine), and chronic diastolic heart failure. She has a hx of DVT, not currently on OAC.   R/L heart cath in 2017 with no obstructive CAD.   Symptoms often difficult given underlying obesity and COPD. She has frequent ER visits for volume and respiratory changes.   Last echo 05/2022 with preserved LVEF 55-60% with grade 1 DD, normal RV. She has followed with the AHF team, last seen in August 2025. She also follows with nephrology.    She was seen in the ED for UTI on 01/17/24 and she was placed on keflex . She came back to the ER last evening with increased urinary frequency, UA did not show worsening infection. She was discharged without admission with pyridium for interstitial cystitis.   She returns to the ER today with increased DOE. She reports 2-3 days of worsening SOB and face burning after starting the keflex . She describes bendopnea. She also reports DOE and LE edema. She took one torsemide  this morning, she reports nephrology reduced torsemide  to 20 mg twice daily about one month ago.  However, she has missed doses since her UTI diagnosis.   She also reports chest pain that coincides with SOB. CP described as a tightness - intermittent chest pain and face tingling. She reports chest pain that started over the last 2-3 days.   HS troponin 6 --> 210 EKG  with SR, does not appear ischemic. BNP 62 sCr 0.86, K 4.0  Telemetry with SR in the 60s but PVCs and episodes of trigeminy.    Past Medical History:  Diagnosis Date   Anxiety    Arthritis    Asthma    Chronic diastolic CHF (congestive heart failure) (HCC)    COPD (chronic obstructive pulmonary disease) (HCC)    Uses Oxygen  at night   Depression    Diabetes mellitus without complication (HCC)    GERD (gastroesophageal reflux disease)    Gout    Headache    migraines   History of DVT of lower extremity    Hypertension    Morbid obesity with BMI of 45.0-49.9, adult (HCC)    Thyroid  cancer (HCC) 09/23/2016    Past Surgical History:  Procedure Laterality Date   BUNIONECTOMY Bilateral    CARDIAC CATHETERIZATION N/A 06/23/2015   Procedure: Right/Left Heart Cath and Coronary Angiography;  Surgeon: Ezra GORMAN Shuck, MD;  Location: [facility] INVASIVE CV LAB;  Service: Cardiovascular;  Laterality: N/A;   COLONOSCOPY WITH PROPOFOL  N/A 12/15/2015   Procedure: COLONOSCOPY WITH PROPOFOL ;  Surgeon: Norleen Hint, MD;  Location: [facility] ENDOSCOPY;  Service: Endoscopy;  Laterality: N/A;   RIGHT HEART CATH N/A 10/01/2019   Procedure: RIGHT HEART CATH;  Surgeon: Shuck Ezra GORMAN, MD;  Location: [facility] INVASIVE CV LAB;  Service: Cardiovascular;  Laterality: N/A;   THYROIDECTOMY  09/19/2016   THYROIDECTOMY N/A 09/19/2016  Procedure: TOTAL THYROIDECTOMY;  Surgeon: Eletha Boas, MD;  Location: [facility] OR;  Service: General;  Laterality: N/A;   TONSILLECTOMY     TOTAL ABDOMINAL HYSTERECTOMY  07/14/10     Home Medications:  Prior to Admission medications   Medication Sig Start Date End Date Taking? Authorizing Provider  Accu-Chek Softclix Lancets lancets Use to check blood sugar 3 times daily 12/26/23   [name], MD  ACETAMINOPHEN  PO Take 1,300-2,050 mg by mouth daily as needed for moderate pain (pain score 4-6) or headache.    [provider]  albuterol  (VENTOLIN  HFA) 108 (90 Base) MCG/ACT inhaler Inhale  1-2 puffs into the lungs every 6 (six) hours as needed for wheezing or shortness of breath. 12/26/23   [name], MD  allopurinol  (ZYLOPRIM ) 100 MG tablet Take 1 tablet (100 mg total) by mouth daily. 11/13/23 11/07/24  [name], MD  aspirin  EC 81 MG tablet Take 1 tablet (81 mg total) by mouth daily. Swallow whole. 11/13/23 11/07/24  [name], MD  blood glucose meter kit and supplies KIT Dispense based on patient and insurance preference. Use up to four times daily as directed. 12/25/20   Lue Elsie BROCKS, MD  Blood Glucose Monitoring Suppl DEVI 1 each by Does not apply route in the morning, at noon, and at bedtime. May substitute to any manufacturer covered by patient's insurance. 11/13/23   [name], MD  budesonide  (PULMICORT ) 0.25 MG/2ML nebulizer solution Inhale 2 mLs (0.25 mg total) by nebulization 2 (two) times daily. 11/13/23   [name], MD  busPIRone  (BUSPAR ) 30 MG tablet Take 1 tablet (30 mg total) by mouth 2 (two) times daily. 12/05/23 11/29/24  [name], MD  carvedilol  (COREG ) 6.25 MG tablet Take 1 tablet (6.25 mg total) by mouth 2 (two) times daily. 12/26/23   [name], MD  cephALEXin  (KEFLEX ) 500 MG capsule Take 1 capsule (500 mg total) by mouth 3 (three) times daily for 7 days. 01/15/24 01/22/24  Theadore Ozell HERO, MD  clotrimazole  (LOTRIMIN ) 1 % cream Apply topically 2 (two) times daily. 12/13/23   [name], MD  diclofenac  Sodium (VOLTAREN ) 1 % GEL Apply 4 g topically 4 (four) times daily. 12/15/23   Freddi Hamilton, MD  dicyclomine  (BENTYL ) 20 MG tablet Take 1 tablet (20 mg total) by mouth 2 (two) times daily. 12/26/23   [name], MD  doxepin  (SINEQUAN ) 10 MG capsule Take 1 capsule (10 mg total) by mouth at bedtime as needed (insomnia). 01/02/24 04/01/24  [name], MD  EPINEPHrine  0.3 mg/0.3 mL IJ SOAJ injection Inject 0.3 mg into the muscle once as needed for anaphylaxis.    [provider]  FARXIGA  10 MG TABS  tablet Take 1 tablet (10 mg total) by mouth daily before breakfast. 11/06/23   [name], MD  fenofibrate  (TRICOR ) 145 MG tablet Take 1 tablet (145 mg total) by mouth daily. 12/26/23   [name], MD  FEROSUL 325 (65 Fe) MG tablet Take 1 tablet (325 mg total) by mouth daily. 12/26/23   [name], MD  gabapentin (NEURONTIN) 100 MG capsule Take 1 capsule 3 times a day by oral route as needed for 30 days. 12/06/23   [provider]  glucose blood (ACCU-CHEK GUIDE TEST) test strip Use to check blood sugar 3 times daily 12/26/23   [name], MD  ipratropium-albuterol  (DUONEB) 0.5-2.5 (3) MG/3ML SOLN Take 3 mLs by nebulization every 4 (four)  hours as needed. 01/20/24   Beam, Elsie, MD  levocetirizine (XYZAL ) 5 MG tablet TAKE 1 TABLET (5 MG TOTAL) BY MOUTH AT BEDTIME. 12/17/23   [name], MD  levothyroxine  (SYNTHROID ) 125 MCG tablet Take 1 tablet (125 mcg total) by mouth at bedtime. 01/02/24   [name], MD  losartan  (COZAAR ) 25 MG tablet Take 1 tablet (25 mg total) by mouth daily. 11/13/23   [name], MD  lurasidone  (LATUDA ) 20 MG TABS tablet Take 1 tablet (20 mg total) by mouth at bedtime. Take with a snack. This is for mood and sleep. 12/25/23 12/19/24  [name], MD  nitroGLYCERIN  (NITROSTAT ) 0.4 MG SL tablet Place 1 tablet (0.4 mg total) under the tongue every 5 (five) minutes as needed for chest pain. 12/25/23   [name], MD  omeprazole  (PRILOSEC) 20 MG capsule Take 2 capsules (40 mg total) by mouth daily. 11/06/23   [name], MD  ondansetron  (ZOFRAN -ODT) 4 MG disintegrating tablet Take 1 tablet (4 mg total) by mouth every 8 (eight) hours as needed for nausea or vomiting. 12/26/23   [name], MD  OXYGEN  Inhale 2 L into the lungs continuous.    [provider]  phenazopyridine (PYRIDIUM) 200 MG tablet Take 1 tablet (200 mg total) by mouth 3 (three) times daily. 01/16/24   Dreama Longs, MD  potassium  chloride (KLOR-CON ) 10 MEQ tablet Take 1 tablet (10 mEq total) by mouth 2 (two) times daily. 11/13/23   [name], MD  Rimegepant Sulfate (NURTEC) 75 MG TBDP Take 1 tablet (75 mg total) by mouth every other day. Prevents and Treats Migraine Headaches 12/25/23 12/19/24  [name], MD  rizatriptan (MAXALT) 10 MG tablet Take 1 tablet (10 mg total) by mouth as needed for migraine. May repeat in 2 hours if needed 01/06/24   [name], MD  rosuvastatin  (CRESTOR ) 10 MG tablet Take 1 tablet (10 mg total) by mouth every evening. 11/13/23   [name], MD  torsemide  (DEMADEX ) 20 MG tablet Take 2 tablets (40 mg total) by mouth 2 (two) times daily. 11/13/23   [name], MD  umeclidinium-vilanterol (ANORO ELLIPTA ) 62.5-25 MCG/ACT AEPB Inhale 1 puff into the lungs daily. 01/02/24   [name], MD  Vitamin D , Ergocalciferol , (DRISDOL ) 1.25 MG (50000 UNIT) CAPS capsule Take 1 capsule (50,000 Units total) by mouth every 7 (seven) days. 11/13/23   [name], MD    Scheduled Meds:  furosemide   40 mg Intravenous BID   Continuous Infusions:  magnesium  sulfate bolus IVPB     PRN Meds:   Allergies:    Allergies  Allergen Reactions   Bee Venom Anaphylaxis    All over my body (swelling)   Fioricet  [Butalbital -Apap-Caffeine ] Nausea And Vomiting and Rash   Ibuprofen  Rash and Other (See Comments)    Severe rash   Lamisil  [Terbinafine ] Rash and Other (See Comments)    Pt states this causes her to feel funny   Nsaids Other (See Comments)    Per MD's orders     Social History:   Social History   Socioeconomic History   Marital status: Single    Spouse name: Not on file   Number of children: 1   Years of education: Not on file   Highest education level: Not on file  Occupational History   Occupation: disabled  Tobacco Use   Smoking status: Former    Current packs/day: 0.00  Average packs/day: 0.5 packs/day for 41.0 years (20.5 ttl pk-yrs)    Types:  Cigarettes    Start date: 07/1975    Quit date: 07/2016    Years since quitting: 7.5   Smokeless tobacco: Never   Tobacco comments:    Pt stop smoking 07/04/2011  Vaping Use   Vaping status: Never Used  Substance and Sexual Activity   Alcohol use: Not Currently    Comment: quit 12 years ago   Drug use: Not Currently    Types: Marijuana, Cocaine    Comment: quit 12 years ago   Sexual activity: Yes  Other Topics Concern   Not on file  Social History Narrative   Not on file   Social Drivers of Health   Financial Resource Strain: Low Risk  (10/24/2022)   Overall Financial Resource Strain (CARDIA)    Difficulty of Paying Living Expenses: Not very hard  Food Insecurity: No Food Insecurity (09/09/2023)   Hunger Vital Sign    Worried About Running Out of Food in the Last Year: Never true    Ran Out of Food in the Last Year: Never true  Transportation Needs: No Transportation Needs (09/09/2023)   PRAPARE - Administrator, Civil Service (Medical): No    Lack of Transportation (Non-Medical): No  Physical Activity: Inactive (02/01/2021)   Exercise Vital Sign    Days of Exercise per Week: 0 days    Minutes of Exercise per Session: 0 min  Stress: Stress Concern Present (02/23/2021)   [employer] of Occupational Health - Occupational Stress Questionnaire    Feeling of Stress : To some extent  Social Connections: Unknown (08/21/2021)   Received from [facility]   Social Network    Social Network: Not on file  Intimate Partner Violence: Not At Risk (09/09/2023)   Humiliation, Afraid, Rape, and Kick questionnaire    Fear of Current or Ex-Partner: No    Emotionally Abused: No    Physically Abused: No    Sexually Abused: No    Family History:    Family History  Problem Relation Age of Onset   Cancer Father        thought to be due to exposure to concrete   Diabetes Mother    Thyroid  cancer Mother        dx in her 28s-60s   Cancer Maternal Uncle        2 uncles  with cancer NOS   Brain cancer Paternal Aunt    Cancer Cousin        maternal first cousin - NOS   Cancer Cousin        maternal first cousin - NOS     ROS:  Please see the history of present illness.   All other ROS reviewed and negative.     Physical Exam/Data: Vitals:   01/21/24 1600 01/21/24 1645 01/21/24 1700 01/21/24 1715  BP: (!) 138/54 138/63 (!) 143/95 (!) 145/85  Pulse: 68 (!) 57 69 77  Resp: 14 15 16 19   Temp:      TempSrc:      SpO2: 97% 99% 95% 95%  Weight:      Height:       No intake or output data in the 24 hours ending 01/21/24 1735    01/21/2024    1:20 PM 01/20/2024    8:37 PM 01/14/2024   11:22 PM  Last 3 Weights  Weight (lbs) 314 lb 305 lb 314 lb  Weight (kg) 142.429  kg 138.347 kg 142.429 kg     Body mass index is 52.25 kg/m.  General:  obese female in NAD HEENT: normal Neck: exam difficult Vascular: No carotid bruits; Distal pulses 2+ bilaterally Cardiac:  irregular rhythm, regular rate Lungs:  examined completing nebulizer Abd: soft, nontender, no hepatomegaly  Ext: + edema Musculoskeletal:  No deformities, BUE and BLE strength normal and equal Skin: warm and dry  Neuro:  CNs 2-12 intact, no focal abnormalities noted Psych:  Normal affect   EKG:  The EKG was personally reviewed and demonstrates:  SR with HR 75 Telemetry:  Telemetry was personally reviewed and demonstrates:  SR 60s with frequent PVCs, trigeminy  Relevant CV Studies:  Echo pending  Echo 2024: 1. Left ventricular ejection fraction, by estimation, is 55 to 60%. The  left ventricle has normal function. The left ventricle has no regional  wall motion abnormalities. There is mild concentric left ventricular  hypertrophy. Left ventricular diastolic  parameters are consistent with Grade I diastolic dysfunction (impaired  relaxation).   2. Very mildly D-shaped interventricular septum suggestive of RV  pressure/volume overload. Right ventricular systolic function is normal.   The right ventricular size is normal.   3. Right atrial size was mildly dilated.   4. The mitral valve is normal in structure. Trivial mitral valve  regurgitation. No evidence of mitral stenosis.   5. The aortic valve is tricuspid. Aortic valve regurgitation is not  visualized. No aortic stenosis is present.   6. IVC not visualized. Peak RV-RA gradient 17 mmHg.   7. Technically difficult study with poor acoustic windows.   Laboratory Data: High Sensitivity Troponin:   Recent Labs  Lab 01/14/24 2324 01/20/24 2050 01/21/24 1322 01/21/24 1511  TROPONINIHS 6 5 6  210*  210*     Chemistry Recent Labs  Lab 01/16/24 1406 01/20/24 2050 01/21/24 1322 01/21/24 1339  NA 143 141 138 139  K 3.7 4.5 4.0 4.4  CL 100 98 93*  --   CO2 32 34* 34*  --   GLUCOSE 127* 123* 140*  --   BUN 14 8 9   --   CREATININE 0.81 0.78 0.86  --   CALCIUM  8.7* 8.5* 8.5*  --   GFRNONAA >60 >60 >60  --   ANIONGAP 12 9 11   --     Recent Labs  Lab 01/16/24 1406 01/20/24 2050 01/21/24 1322  PROT 6.9 6.5 6.7  ALBUMIN  4.2 3.4* 3.6  AST 21 24 27   ALT 12 26 24   ALKPHOS 74 60 64  BILITOT 0.4 0.6 0.9   Lipids No results for input(s): CHOL, TRIG, HDL, LABVLDL, LDLCALC, CHOLHDL in the last 168 hours.  Hematology Recent Labs  Lab 01/16/24 1406 01/20/24 2050 01/21/24 1322 01/21/24 1339  WBC 14.2* 14.6* 15.4*  --   RBC 3.86* 3.53* 3.68*  --   HGB 11.2* 10.6* 11.1* 12.2  HCT 38.1 35.8* 36.2 36.0  MCV 98.7 101.4* 98.4  --   MCH 29.0 30.0 30.2  --   MCHC 29.4* 29.6* 30.7  --   RDW 13.7 13.5 13.4  --   PLT 291 333 339  --    Thyroid  No results for input(s): TSH, FREET4 in the last 168 hours.  BNP Recent Labs  Lab 01/16/24 2043 01/20/24 2050 01/21/24 1322  BNP  --  73.0 61.6  PROBNP 183.0  --   --     DDimer  Recent Labs  Lab 01/21/24 1633  DDIMER 0.30    Radiology/Studies:  DG Chest 2 View Result Date: 01/21/2024 CLINICAL DATA:  Worsening shortness of breath. EXAM:  CHEST - 2 VIEW COMPARISON:  January 20, 2024 FINDINGS: The cardiac silhouette is enlarged and unchanged in size. There is marked severity calcification of the aortic arch. There is mild, stable prominence of the central pulmonary vasculature. Mild linear scarring and/or atelectasis is seen within the mid right lung and mid to lower left lung. No pleural effusion or pneumothorax is identified. Multilevel degenerative changes are seen throughout the thoracic spine. IMPRESSION: 1. Cardiomegaly with mild prominence of the central pulmonary vasculature. 2. Mild mid right lung and mid to lower left lung linear scarring and/or atelectasis. Electronically Signed   By: Suzen Dials M.D.   On: 01/21/2024 14:34   DG Chest Portable 1 View Result Date: 01/20/2024 CLINICAL DATA:  Shortness of breath EXAM: PORTABLE CHEST 1 VIEW COMPARISON:  01/15/2024 FINDINGS: Cardiac shadow is prominent similar to that seen on the prior exam. Overlying soft tissue density is noted. The lungs are clear bilaterally. No bony abnormality is seen. 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 No nodular lesions are not well appreciated on this exam. IMPRESSION: Prominent overlying soft tissue.  No focal abnormality is noted. Electronically Signed   By: Oneil Devonshire M.D.   On: 01/20/2024 21:17     Assessment and Plan:  Acute on chronic diastolic heart failure - has not been taking torsemide  because of her UTI - renal function stable - will repeat an echo - has been on 20 mg torsemide  BID - has skipped doses in the setting of UTI - would hold farxiga  given UTI - start 40 mg IV lasix  BID with BMP tomorrow - suspect we will need to repeat a right and left heart cath in the next 24-48 hrs depending on CE trend and diuresis   Elevated troponin Chest pain - trend 2 more CE - EKG appears stable - echo as above - symptoms of chest pain difficult to ascertain given underlying other co-morbid conditions and complaints - given increase  in CE, will continue to trend and heparinize for now   PVCs - will check a Mg and TSH - will continue BB - given frequency will go ahead and give 2 g IV Mg - may be due to underlying respiratory status vs CAD vs worsening LV function   Hx of SVT - continue BB   Risk Assessment/Risk Scores:    TIMI Risk Score for Unstable Angina or Non-ST Elevation MI:   The patient's TIMI risk score is 3, which indicates a 13% risk of all cause mortality, new or recurrent myocardial infarction or need for urgent revascularization in the next 14 days.  New [place]  Heart Association (NYHA) Functional Class NYHA Class IV       For questions or updates, please contact  [facility] Please consult [email or web address] for contact info under      Signed, Jon Nat Hails, PA  01/21/2024 5:35 PM

## 2024-01-21 NOTE — Telephone Encounter (Signed)
 Patient called very upset and angry-states I keep pissing on myself. Patient reports that since 01/16/2024 patient has been having uncontrollable urination. Patient states she had a CT scan done on 10/8 with contrast. Patient states she will start to go and have no control. Patient unwilling to give more detail stating You all aren't helping me. I need to be admitted to the hospital. Patient is refusing an office appointment-recommended the Emergency Department to be evaluated. Patient is requesting recommendations from provider.   FYI Only or Action Required?: Action required by provider: clinical question for provider.  Patient was last seen in primary care on 12/25/2023 by [name], MD.  Called Nurse Triage reporting Urinary Frequency.  Symptoms began several days ago.  Interventions attempted: Rest, hydration, or home remedies.  Symptoms are: gradually worsening.  Triage Disposition: No disposition on file.  Patient/caregiver understands and will follow disposition?:   Copied from CRM [id number]. Topic: Clinical - Red Word Triage >> Jan 21, 2024 11:42 AM Pinkey ORN wrote: Red Word that prompted transfer to Nurse Triage: Worsening Frequent Urination >> Jan 21, 2024 11:44 AM Pinkey ORN wrote: Patient states she was prescribed a medication and she believes to be having a side effect reaction.  Reason for Disposition  [1] Can't control passage of urine (i.e., urinary incontinence) AND [2] new-onset (< 2 weeks) or worsening  Answer Assessment - Initial Assessment Questions 1. SYMPTOM: What's the main symptom you're concerned about? (e.g., frequency, incontinence)     Urinary frequency 2. ONSET: When did the  urinary frequency  start?     Started 01/16/2024 after getting a CT scan per patient.  3. PAIN: Is there any pain? If Yes, ask: How bad is it? (Scale: 1-10; mild, moderate, severe)     Patient wouldn't say.  4. CAUSE: What do you think is causing the  symptoms?     unsure 5. OTHER SYMPTOMS: Do you have any other symptoms? (e.g., blood in urine, fever, flank pain, pain with urination)     Patient wouldn't give any more information.  Protocols used: Urinary Symptoms-A-AH

## 2024-01-21 NOTE — H&P (Signed)
 [facility] History and Physical  Michele Owens FMW:[id number] DOB: March 05, 1961 DOA: 01/21/2024  Referring physician: ED  PCP: [name], MD   Patient is coming from: Home  Chief Complaint: Shortness of breath  HPI:  Patient is a 63 year old female with past medical history of heart failure with preserved ejection fraction, diabetes mellitus type 2, hypertension, VTE not on anticoagulation, thyroid  malignancy hyperlipidemia, hypothyroidism, chronic respiratory failure with obesity hypoventilation syndrome and obstructive sleep apnea, COPD, bipolar disorder on medications presented to the hospital with difficulty breathing.  Patient had presented to hospital on 01/20/2024 with dysuria, increased shortness of breath for 6 days.  In the ED, AT THAT TIME CTA of the chest was done without any pulmonary embolism and was subsequently prescribed Pyridium with concern for interstitial cystitis.  Was already on Keflex  at the time of presentation.  Patient again presented to the hospital today with complaints of shortness of breath especially on exertion, urinary incontinence for 1 week.  Patient stated that she had stopped taking torsemide  since that she has been having incontinence.  Complains of some wheezing cough and shortness of breath with dyspnea and some abdominal discomfort.  Patient denies any dysuria but has been having frequent urination.  Denies any fever chills or rigor.  Denies any syncope or palpitation but had some chest discomfort.  Patient also complained of abdominal pain.  Initial pulse ox was 71% on room air.  Patient usually wears 2-3 L of oxygen  at baseline.    In the ED patient was noted to be on 4 L of oxygen  and was subsequently increased to 6 L.  Patient was tachypneic as well.  Labs were notable for VBG with pH of 7.4.  CBC with mild leukocytosis at 15.4.  BNP within normal range at 61.  BMP showed creatinine of 0.8.  COVID influenza and RSV was negative.  D-dimer  negative at 0.3.  Lipase of 25.  Chest x-ray done in the ED showed cardiomegaly with prominent central pulmonary vasculature. Patient was given DuoNebs, Lasix  40 mg IV with IV magnesium  sulfate and Solu-Medrol  x 1.  Cardiology was consulted from the ED and patient was consulted for admission to hospital for further evaluation and treatment.   Assessment and Plan Principal Problem:   Acute on chronic diastolic (congestive) heart failure (HCC) Active Problems:   Chest pain   Benign essential HTN   Chronic hypoxic respiratory failure (HCC)   COPD (chronic obstructive pulmonary disease) (HCC)   OSA (obstructive sleep apnea)   Controlled type 2 diabetes mellitus without complication, without long-term current use of insulin  (HCC)   Acute on chronic diastolic congestive heart failure.  Likely secondary to noncompliance to torsemide  since she was having incontinence.  On 20 mg of torsemide  twice daily but had skipped doses.  Resume Lasix  40 milligram twice daily.  Continue to hold Farxiga  due to UTI.  Will check BMP in AM.  Continue strict intake and output charting, daily weights.  For plans as per cardiology.  Continue losartan .  Elevated troponin chest discomfort.  EKG without any acute changes.  Check 2D echocardiogram.  Patient has been empirically started on heparin  drip.  Cardiology was consulted and recommendation is to trend troponins.  Initial troponin was 6 followed by 210.  History of class III obesity, obesity hypoventilation syndrome, obstructive sleep apnea and mild COPD exacerbation Body mass index is 52.25 kg/m. On nasal cannula oxygen  at home.  Will continue while in the hospital.  Continue supportive care and  including incentive spirometry, continue supplemental oxygen .  Continue nebulizers including inhaled bronchodilators and IV steroids followed by p.o. prednisone .  Patient would benefit from lifestyle modification as outpatient.  PVCs, history of SVT.  Received magnesium   sulfate.  Continue beta-blockers.  Check magnesium  levels in a.m.  History of hyperlipidemia Continue fenofibrate , Crestor   Recent UTI.  Was having some incontinence and was not taking torsemide .  Was on Keflex  will continue to complete the course.  Hypothyroidism Continue Synthroid .  Check TSH  Bipolar disorder Continue Latuda   Controlled diabetes mellitus type 2.  Latest hemoglobin A1c of 6.84 months back.  Will repeat.  Will put the patient on sliding scale insulin  while in the hospital due to use of steroids.  DVT Prophylaxis: Heparin  drip  Review of Systems:  All systems were reviewed and were negative unless otherwise mentioned in the HPI   Past Medical History:  Diagnosis Date   Anxiety    Arthritis    Asthma    Chronic diastolic CHF (congestive heart failure) (HCC)    COPD (chronic obstructive pulmonary disease) (HCC)    Uses Oxygen  at night   Depression    Diabetes mellitus without complication (HCC)    GERD (gastroesophageal reflux disease)    Gout    Headache    migraines   History of DVT of lower extremity    Hypertension    Morbid obesity with BMI of 45.0-49.9, adult (HCC)    Thyroid  cancer (HCC) 09/23/2016   Past Surgical History:  Procedure Laterality Date   BUNIONECTOMY Bilateral    CARDIAC CATHETERIZATION N/A 06/23/2015   Procedure: Right/Left Heart Cath and Coronary Angiography;  Surgeon: Ezra GORMAN Shuck, MD;  Location: [facility] INVASIVE CV LAB;  Service: Cardiovascular;  Laterality: N/A;   COLONOSCOPY WITH PROPOFOL  N/A 12/15/2015   Procedure: COLONOSCOPY WITH PROPOFOL ;  Surgeon: Norleen Hint, MD;  Location: [facility] ENDOSCOPY;  Service: Endoscopy;  Laterality: N/A;   RIGHT HEART CATH N/A 10/01/2019   Procedure: RIGHT HEART CATH;  Surgeon: Shuck Ezra GORMAN, MD;  Location: [facility] INVASIVE CV LAB;  Service: Cardiovascular;  Laterality: N/A;   THYROIDECTOMY  09/19/2016   THYROIDECTOMY N/A 09/19/2016   Procedure: TOTAL THYROIDECTOMY;  Surgeon: Eletha Boas, MD;  Location: [facility]  OR;  Service: General;  Laterality: N/A;   TONSILLECTOMY     TOTAL ABDOMINAL HYSTERECTOMY  07/14/10    Social History:  reports that she quit smoking about 7 years ago. Her smoking use included cigarettes. She started smoking about 48 years ago. She has a 20.5 pack-year smoking history. She has never used smokeless tobacco. She reports that she does not currently use alcohol. She reports that she does not currently use drugs after having used the following drugs: Marijuana and Cocaine.  Allergies  Allergen Reactions   Bee Venom Anaphylaxis    All over my body (swelling)   Fioricet  [Butalbital -Apap-Caffeine ] Nausea And Vomiting and Rash   Ibuprofen  Rash and Other (See Comments)    Severe rash   Lamisil  [Terbinafine ] Rash and Other (See Comments)    Pt states this causes her to feel funny   Nsaids Other (See Comments)    Per MD's orders     Family History  Problem Relation Age of Onset   Cancer Father        thought to be due to exposure to concrete   Diabetes Mother    Thyroid  cancer Mother        dx in her 63s-60s   Cancer Maternal Uncle  2 uncles with cancer NOS   Brain cancer Paternal Aunt    Cancer Cousin        maternal first cousin - NOS   Cancer Cousin        maternal first cousin - NOS     Prior to Admission medications   Medication Sig Start Date End Date Taking? Authorizing Provider  Accu-Chek Softclix Lancets lancets Use to check blood sugar 3 times daily 12/26/23   [name], MD  ACETAMINOPHEN  PO Take 1,300-2,050 mg by mouth daily as needed for moderate pain (pain score 4-6) or headache.    [provider]  albuterol  (VENTOLIN  HFA) 108 (90 Base) MCG/ACT inhaler Inhale 1-2 puffs into the lungs every 6 (six) hours as needed for wheezing or shortness of breath. 12/26/23   [name], MD  allopurinol  (ZYLOPRIM ) 100 MG tablet Take 1 tablet (100 mg total) by mouth daily. 11/13/23 11/07/24  [name], MD  aspirin  EC 81 MG tablet Take 1  tablet (81 mg total) by mouth daily. Swallow whole. 11/13/23 11/07/24  [name], MD  blood glucose meter kit and supplies KIT Dispense based on patient and insurance preference. Use up to four times daily as directed. 12/25/20   Lue Elsie BROCKS, MD  Blood Glucose Monitoring Suppl DEVI 1 each by Does not apply route in the morning, at noon, and at bedtime. May substitute to any manufacturer covered by patient's insurance. 11/13/23   [name], MD  budesonide  (PULMICORT ) 0.25 MG/2ML nebulizer solution Inhale 2 mLs (0.25 mg total) by nebulization 2 (two) times daily. 11/13/23   [name], MD  busPIRone  (BUSPAR ) 30 MG tablet Take 1 tablet (30 mg total) by mouth 2 (two) times daily. 12/05/23 11/29/24  [name], MD  carvedilol  (COREG ) 6.25 MG tablet Take 1 tablet (6.25 mg total) by mouth 2 (two) times daily. 12/26/23   [name], MD  cephALEXin  (KEFLEX ) 500 MG capsule Take 1 capsule (500 mg total) by mouth 3 (three) times daily for 7 days. 01/15/24 01/22/24  Theadore Ozell HERO, MD  clotrimazole  (LOTRIMIN ) 1 % cream Apply topically 2 (two) times daily. 12/13/23   [name], MD  diclofenac  Sodium (VOLTAREN ) 1 % GEL Apply 4 g topically 4 (four) times daily. 12/15/23   Freddi Hamilton, MD  dicyclomine  (BENTYL ) 20 MG tablet Take 1 tablet (20 mg total) by mouth 2 (two) times daily. 12/26/23   [name], MD  doxepin  (SINEQUAN ) 10 MG capsule Take 1 capsule (10 mg total) by mouth at bedtime as needed (insomnia). 01/02/24 04/01/24  [name], MD  EPINEPHrine  0.3 mg/0.3 mL IJ SOAJ injection Inject 0.3 mg into the muscle once as needed for anaphylaxis.    [provider]  FARXIGA  10 MG TABS tablet Take 1 tablet (10 mg total) by mouth daily before breakfast. 11/06/23   [name], MD  fenofibrate  (TRICOR ) 145 MG tablet Take 1 tablet (145 mg total) by mouth daily. 12/26/23   [name], MD  FEROSUL 325 (65 Fe) MG tablet Take 1 tablet (325 mg total) by  mouth daily. 12/26/23   [name], MD  gabapentin (NEURONTIN) 100 MG capsule Take 1 capsule 3 times a day by oral route as needed for 30 days. 12/06/23   [provider]  glucose blood (ACCU-CHEK GUIDE TEST) test strip Use to check blood sugar 3 times daily 12/26/23   [name], MD  ipratropium-albuterol  (DUONEB) 0.5-2.5 (  3) MG/3ML SOLN Take 3 mLs by nebulization every 4 (four) hours as needed. 01/20/24   Beam, Elsie, MD  levocetirizine (XYZAL ) 5 MG tablet TAKE 1 TABLET (5 MG TOTAL) BY MOUTH AT BEDTIME. 12/17/23   [name], MD  levothyroxine  (SYNTHROID ) 125 MCG tablet Take 1 tablet (125 mcg total) by mouth at bedtime. 01/02/24   [name], MD  losartan  (COZAAR ) 25 MG tablet Take 1 tablet (25 mg total) by mouth daily. 11/13/23   [name], MD  lurasidone  (LATUDA ) 20 MG TABS tablet Take 1 tablet (20 mg total) by mouth at bedtime. Take with a snack. This is for mood and sleep. 12/25/23 12/19/24  [name], MD  nitroGLYCERIN  (NITROSTAT ) 0.4 MG SL tablet Place 1 tablet (0.4 mg total) under the tongue every 5 (five) minutes as needed for chest pain. 12/25/23   [name], MD  omeprazole  (PRILOSEC) 20 MG capsule Take 2 capsules (40 mg total) by mouth daily. 11/06/23   [name], MD  ondansetron  (ZOFRAN -ODT) 4 MG disintegrating tablet Take 1 tablet (4 mg total) by mouth every 8 (eight) hours as needed for nausea or vomiting. 12/26/23   [name], MD  OXYGEN  Inhale 2 L into the lungs continuous.    [provider]  phenazopyridine (PYRIDIUM) 200 MG tablet Take 1 tablet (200 mg total) by mouth 3 (three) times daily. 01/16/24   Dreama Longs, MD  potassium chloride  (KLOR-CON ) 10 MEQ tablet Take 1 tablet (10 mEq total) by mouth 2 (two) times daily. 11/13/23   [name], MD  Rimegepant Sulfate (NURTEC) 75 MG TBDP Take 1 tablet (75 mg total) by mouth every other day. Prevents and Treats Migraine Headaches 12/25/23 12/19/24   [name], MD  rizatriptan (MAXALT) 10 MG tablet Take 1 tablet (10 mg total) by mouth as needed for migraine. May repeat in 2 hours if needed 01/06/24   [name], MD  rosuvastatin  (CRESTOR ) 10 MG tablet Take 1 tablet (10 mg total) by mouth every evening. 11/13/23   [name], MD  torsemide  (DEMADEX ) 20 MG tablet Take 2 tablets (40 mg total) by mouth 2 (two) times daily. 11/13/23   [name], MD  umeclidinium-vilanterol (ANORO ELLIPTA ) 62.5-25 MCG/ACT AEPB Inhale 1 puff into the lungs daily. 01/02/24   [name], MD  Vitamin D , Ergocalciferol , (DRISDOL ) 1.25 MG (50000 UNIT) CAPS capsule Take 1 capsule (50,000 Units total) by mouth every 7 (seven) days. 11/13/23   [name], MD    Physical Exam:  Vitals:   01/21/24 1600 01/21/24 1645 01/21/24 1700 01/21/24 1715  BP: (!) 138/54 138/63 (!) 143/95 (!) 145/85  Pulse: 68 (!) 57 69 77  Resp: 14 15 16 19   Temp:      TempSrc:      SpO2: 97% 99% 95% 95%  Weight:      Height:       Wt Readings from Last 3 Encounters:  01/21/24 (!) 142.4 kg  01/20/24 (!) 138.3 kg  01/14/24 (!) 142.4 kg   Body mass index is 52.25 kg/m.  General: Morbidly obese built, not in obvious distress, on nasal cannula oxygen , HENT: Normocephalic, No scleral pallor or icterus noted. Oral mucosa is moist.  Chest: Diminished breath sounds bilaterally.  Coarse breath sounds noted. \CVS: S1 &S2 heard. No murmur.  Regular rate and rhythm. Abdomen: Soft, nontender, nondistended.  Bowel sounds are heard. No abdominal mass palpated Extremities: No cyanosis, clubbing with  trace peripheral edema peripheral pulses are palpable. Psych: Alert, awake and oriented, normal mood CNS:  No cranial nerve deficits.  Power equal in all extremities.   Skin: Warm and dry.  No rashes noted.  Labs on Admission:   CBC: Recent Labs  Lab 01/14/24 2324 01/16/24 1406 01/20/24 2050 01/21/24 1322 01/21/24 1339  WBC 17.8* 14.2* 14.6* 15.4*  --    NEUTROABS  --  11.3* 11.8* 12.4*  --   HGB 12.5 11.2* 10.6* 11.1* 12.2  HCT 41.9 38.1 35.8* 36.2 36.0  MCV 99.3 98.7 101.4* 98.4  --   PLT 289 291 333 339  --     Basic Metabolic Panel: Recent Labs  Lab 01/14/24 2324 01/16/24 1406 01/20/24 2050 01/21/24 1322 01/21/24 1339  NA 143 143 141 138 139  K 3.8 3.7 4.5 4.0 4.4  CL 98 100 98 93*  --   CO2 32 32 34* 34*  --   GLUCOSE 183* 127* 123* 140*  --   BUN 16 14 8 9   --   CREATININE 0.94 0.81 0.78 0.86  --   CALCIUM  8.4* 8.7* 8.5* 8.5*  --     Liver Function Tests: Recent Labs  Lab 01/14/24 2324 01/16/24 1406 01/20/24 2050 01/21/24 1322  AST 24 21 24 27   ALT 16 12 26 24   ALKPHOS 61 74 60 64  BILITOT 0.6 0.4 0.6 0.9  PROT 7.2 6.9 6.5 6.7  ALBUMIN  4.1 4.2 3.4* 3.6   Recent Labs  Lab 01/16/24 1406 01/21/24 1511  LIPASE 28 25   No results for input(s): AMMONIA  in the last 168 hours.  Cardiac Enzymes: No results for input(s): CKTOTAL, CKMB, CKMBINDEX, TROPONINI in the last 168 hours.  BNP (last 3 results) Recent Labs    11/29/23 1618 01/20/24 2050 01/21/24 1322  BNP 15.8 73.0 61.6    ProBNP (last 3 results) Recent Labs    01/16/24 2043  PROBNP 183.0    CBG: Recent Labs  Lab 01/20/24 2043  GLUCAP 116*    Lipase     Component Value Date/Time   LIPASE 25 01/21/2024 1511     Urinalysis    Component Value Date/Time   COLORURINE YELLOW 01/20/2024 2050   APPEARANCEUR CLEAR 01/20/2024 2050   LABSPEC 1.018 01/20/2024 2050   PHURINE 6.0 01/20/2024 2050   GLUCOSEU >=500 (A) 01/20/2024 2050   HGBUR NEGATIVE 01/20/2024 2050   BILIRUBINUR NEGATIVE 01/20/2024 2050   KETONESUR NEGATIVE 01/20/2024 2050   PROTEINUR NEGATIVE 01/20/2024 2050   UROBILINOGEN 0.2 02/28/2012 2004   NITRITE NEGATIVE 01/20/2024 2050   LEUKOCYTESUR SMALL (A) 01/20/2024 2050     Drugs of Abuse     Component Value Date/Time   LABOPIA NONE DETECTED 01/15/2024 0111   COCAINSCRNUR NONE DETECTED 01/15/2024 0111    LABBENZ NONE DETECTED 01/15/2024 0111   AMPHETMU NONE DETECTED 01/15/2024 0111   THCU NONE DETECTED 01/15/2024 0111   LABBARB NONE DETECTED 01/15/2024 0111      Radiological Exams on Admission: DG Chest 2 View Result Date: 01/21/2024 CLINICAL DATA:  Worsening shortness of breath. EXAM: CHEST - 2 VIEW COMPARISON:  January 20, 2024 FINDINGS: The cardiac silhouette is enlarged and unchanged in size. There is marked severity calcification of the aortic arch. There is mild, stable prominence of the central pulmonary vasculature. Mild linear scarring and/or atelectasis is seen within the mid right lung and mid to lower left lung. No pleural effusion or pneumothorax is identified. Multilevel degenerative changes are seen throughout the thoracic spine.  IMPRESSION: 1. Cardiomegaly with mild prominence of the central pulmonary vasculature. 2. Mild mid right lung and mid to lower left lung linear scarring and/or atelectasis. Electronically Signed   By: Suzen Dials M.D.   On: 01/21/2024 14:34   DG Chest Portable 1 View Result Date: 01/20/2024 CLINICAL DATA:  Shortness of breath EXAM: PORTABLE CHEST 1 VIEW COMPARISON:  01/15/2024 FINDINGS: Cardiac shadow is prominent similar to that seen on the prior exam. Overlying soft tissue density is noted. The lungs are clear bilaterally. No bony abnormality is seen. 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 No nodular lesions are not well appreciated on this exam. IMPRESSION: Prominent overlying soft tissue.  No focal abnormality is noted. Electronically Signed   By: Oneil Devonshire M.D.   On: 01/20/2024 21:17    EKG: Personally reviewed by me which shows normal sinus rhythm   Consultant: Cardiology  Code Status: Full code  Microbiology none  Antibiotics: Keflex   Family Communication:  Patients' condition and plan of care including tests being ordered have been discussed with the patient  who indicate understanding and agree with the plan.   Status is:  Inpatient   Severity of Illness: The appropriate patient status for this patient is INPATIENT. Inpatient status is judged to be reasonable and necessary in order to provide the required intensity of service to ensure the patient's safety. The patient's presenting symptoms, physical exam findings, and initial radiographic and laboratory data in the context of their chronic comorbidities is felt to place them at high risk for further clinical deterioration. Furthermore, it is not anticipated that the patient will be medically stable for discharge from the hospital within 2 midnights of admission.   * I certify that at the point of admission it is my clinical judgment that the patient will require inpatient hospital care spanning beyond 2 midnights from the point of admission due to high intensity of service, high risk for further deterioration and high frequency of surveillance required.*  Signed, Vernal Alstrom, MD [facility] 01/21/2024

## 2024-01-21 NOTE — Progress Notes (Signed)
 PHARMACY - ANTICOAGULATION CONSULT NOTE  Pharmacy Consult for heparin  Indication: chest pain/ACS  Allergies  Allergen Reactions   Bee Venom Anaphylaxis    All over my body (swelling)   Fioricet  [Butalbital -Apap-Caffeine ] Nausea And Vomiting and Rash   Ibuprofen  Rash and Other (See Comments)    Severe rash   Lamisil  [Terbinafine ] Rash and Other (See Comments)    Pt states this causes her to feel funny   Nsaids Other (See Comments)    Per MD's orders     Patient Measurements: Height: 5' 5 (165.1 cm) Weight: (!) 142.4 kg (314 lb) IBW/kg (Calculated) : 57 HEPARIN  DW (KG): 92.6  Vital Signs: Temp: 98.6 F (37 C) (10/13 1515) Temp Source: Oral (10/13 1515) BP: 143/95 (10/13 1700) Pulse Rate: 69 (10/13 1700)  Labs: Recent Labs    01/20/24 2050 01/21/24 1322 01/21/24 1339 01/21/24 1511  HGB 10.6* 11.1* 12.2  --   HCT 35.8* 36.2 36.0  --   PLT 333 339  --   --   CREATININE 0.78 0.86  --   --   TROPONINIHS 5 6  --  210*  210*    Estimated Creatinine Clearance: 96.4 mL/min (by C-G formula based on SCr of 0.86 mg/dL).   Medical History: Past Medical History:  Diagnosis Date   Anxiety    Arthritis    Asthma    Chronic diastolic CHF (congestive heart failure) (HCC)    COPD (chronic obstructive pulmonary disease) (HCC)    Uses Oxygen  at night   Depression    Diabetes mellitus without complication (HCC)    GERD (gastroesophageal reflux disease)    Gout    Headache    migraines   History of DVT of lower extremity    Hypertension    Morbid obesity with BMI of 45.0-49.9, adult (HCC)    Thyroid  cancer (HCC) 09/23/2016    Medications:  Scheduled:  None Infusions:   magnesium  sulfate bolus IVPB     Assessment: Patient is a 63 YO F presenting to the ED with shortness of breath. Troponins rising 6 to 210. Cardiology consulted and ECHO ordered. Pharmacy has been consulted to dose heparin  for ACS.   Patient not on anticoagulation PTA. Hgb and plt WNL.    Goal of Therapy:  Heparin  level 0.3-0.7 units/ml Monitor platelets by anticoagulation protocol: Yes   Plan:  -Give heparin  bolus 4000 units -Start heparin  infusion at 1200 units/h.  -Check heparin  level in 6h.  -Monitor heparin , CBC daily  Maurilio Patten, PharmD PGY1 Pharmacy Resident [facility] 01/21/2024 5:35 PM

## 2024-01-22 ENCOUNTER — Inpatient Hospital Stay (HOSPITAL_COMMUNITY)

## 2024-01-22 ENCOUNTER — Telehealth: Payer: Self-pay

## 2024-01-22 ENCOUNTER — Ambulatory Visit: Admitting: Internal Medicine

## 2024-01-22 DIAGNOSIS — I5033 Acute on chronic diastolic (congestive) heart failure: Secondary | ICD-10-CM | POA: Diagnosis not present

## 2024-01-22 LAB — HEPARIN LEVEL (UNFRACTIONATED)
Heparin Unfractionated: <0.1 [IU]/mL — ABNORMAL LOW (ref 0.30–0.70)
Heparin Unfractionated: 0.1 [IU]/mL — ABNORMAL LOW (ref 0.30–0.70)

## 2024-01-22 LAB — BASIC METABOLIC PANEL WITH GFR
Anion gap: 13 (ref 5–15)
Anion gap: 13 (ref 5–15)
BUN: 8 mg/dL (ref 8–23)
BUN: 13 mg/dL (ref 8–23)
CO2: 33 mmol/L — ABNORMAL HIGH (ref 22–32)
CO2: 31 mmol/L (ref 22–32)
Calcium: 8.6 mg/dL — ABNORMAL LOW (ref 8.9–10.3)
Calcium: 8.9 mg/dL (ref 8.9–10.3)
Chloride: 94 mmol/L — ABNORMAL LOW (ref 98–111)
Chloride: 95 mmol/L — ABNORMAL LOW (ref 98–111)
Creatinine, Ser: 0.83 mg/dL (ref 0.44–1.00)
Creatinine, Ser: 1.1 mg/dL — ABNORMAL HIGH (ref 0.44–1.00)
GFR, Estimated: >60 mL/min (ref >60)
GFR, Estimated: 56 mL/min — ABNORMAL LOW (ref >60)
Glucose, Bld: 193 mg/dL — ABNORMAL HIGH (ref 70–99)
Glucose, Bld: 144 mg/dL — ABNORMAL HIGH (ref 70–99)
Potassium: 4 mmol/L (ref 3.5–5.1)
Potassium: 4.5 mmol/L (ref 3.5–5.1)
Sodium: 140 mmol/L (ref 135–145)
Sodium: 139 mmol/L (ref 135–145)

## 2024-01-22 LAB — GLUCOSE, CAPILLARY
Glucose-Capillary: 162 mg/dL — ABNORMAL HIGH (ref 70–99)
Glucose-Capillary: 173 mg/dL — ABNORMAL HIGH (ref 70–99)
Glucose-Capillary: 132 mg/dL — ABNORMAL HIGH (ref 70–99)
Glucose-Capillary: 178 mg/dL — ABNORMAL HIGH (ref 70–99)
Glucose-Capillary: 179 mg/dL — ABNORMAL HIGH (ref 70–99)

## 2024-01-22 LAB — CBC
HCT: 38.6 % (ref 36.0–46.0)
Hemoglobin: 12 g/dL (ref 12.0–15.0)
MCH: 30.2 pg (ref 26.0–34.0)
MCHC: 31.1 g/dL (ref 30.0–36.0)
MCV: 97 fL (ref 80.0–100.0)
Platelets: 385 K/uL (ref 150–400)
RBC: 3.98 MIL/uL (ref 3.87–5.11)
RDW: 13.3 % (ref 11.5–15.5)
WBC: 15.4 K/uL — ABNORMAL HIGH (ref 4.0–10.5)
nRBC: 0 % (ref 0.0–0.2)

## 2024-01-22 LAB — LIPID PANEL
Cholesterol: 185 mg/dL (ref 0–200)
HDL: 64 mg/dL (ref >40)
LDL Cholesterol: 87 mg/dL (ref 0–99)
Total CHOL/HDL Ratio: 2.9 ratio
Triglycerides: 172 mg/dL — ABNORMAL HIGH (ref <150)
VLDL: 34 mg/dL (ref 0–40)

## 2024-01-22 LAB — HEMOGLOBIN A1C
Hgb A1c MFr Bld: 6.9 % — ABNORMAL HIGH (ref 4.8–5.6)
Mean Plasma Glucose: 151.33 mg/dL

## 2024-01-22 LAB — ECHOCARDIOGRAM COMPLETE
Area-P 1/2: 3.6 cm2
Height: 65 in
S' Lateral: 3.4 cm
Weight: 5224.02 [oz_av]

## 2024-01-22 LAB — MAGNESIUM
Magnesium: 2.4 mg/dL (ref 1.7–2.4)
Magnesium: 2.6 mg/dL — ABNORMAL HIGH (ref 1.7–2.4)

## 2024-01-22 MED ORDER — FUROSEMIDE 10 MG/ML IJ SOLN
40.0000 mg | Freq: Three times a day (TID) | INTRAMUSCULAR | Status: DC
  Administered 2024-01-22 (×2): 40 mg via INTRAVENOUS
  Filled 2024-01-22 (×4): qty 4

## 2024-01-22 MED ORDER — NYSTATIN 100000 UNIT/GM EX POWD
Freq: Two times a day (BID) | CUTANEOUS | Status: DC
  Filled 2024-01-22: qty 15

## 2024-01-22 MED ORDER — HYDROCODONE-ACETAMINOPHEN 5-325 MG PO TABS
1.0000 | ORAL_TABLET | Freq: Four times a day (QID) | ORAL | Status: DC | PRN
  Administered 2024-01-22 – 2024-01-23 (×3): 1 via ORAL
  Filled 2024-01-22 (×3): qty 1

## 2024-01-22 MED ORDER — VITAMIN D (ERGOCALCIFEROL) 1.25 MG (50000 UNIT) PO CAPS
50000.0000 [IU] | ORAL_CAPSULE | ORAL | Status: DC
  Administered 2024-01-22: 50000 [IU] via ORAL
  Filled 2024-01-22: qty 1

## 2024-01-22 MED ORDER — PHENAZOPYRIDINE HCL 200 MG PO TABS: 200.0000 mg | ORAL_TABLET | Freq: Three times a day (TID) | ORAL | Status: DC | PRN

## 2024-01-22 MED ORDER — ROSUVASTATIN CALCIUM 20 MG PO TABS
20.0000 mg | ORAL_TABLET | Freq: Every evening | ORAL | Status: DC
  Administered 2024-01-22: 20 mg via ORAL
  Filled 2024-01-22: qty 1

## 2024-01-22 MED ORDER — ENOXAPARIN SODIUM 40 MG/0.4ML IJ SOSY
40.0000 mg | PREFILLED_SYRINGE | INTRAMUSCULAR | Status: DC
  Administered 2024-01-22 – 2024-01-23 (×2): 40 mg via SUBCUTANEOUS
  Filled 2024-01-22 (×2): qty 0.4

## 2024-01-22 MED ORDER — PERFLUTREN LIPID MICROSPHERE
1.0000 mL | INTRAVENOUS | Status: AC | PRN
Start: 2024-01-22 — End: 2024-01-22
  Administered 2024-01-22: 3 mL via INTRAVENOUS

## 2024-01-22 MED ORDER — CLOTRIMAZOLE 1 % EX CREA
TOPICAL_CREAM | Freq: Two times a day (BID) | CUTANEOUS | Status: DC
Start: 2024-01-22 — End: 2024-01-23
  Filled 2024-01-22: qty 15

## 2024-01-22 MED ORDER — CHOLECALCIFEROL 10 MCG/ML (400 UNIT/ML) PO LIQD: 400.0000 [IU] | Freq: Every day | ORAL | Status: DC

## 2024-01-22 NOTE — Evaluation (Signed)
 Occupational Therapy Evaluation Patient Details Name: Michele Owens MRN: [id number] DOB: [birth year] Today's Date: 01/22/2024   History of Present Illness   63 y/o F adm 01/21/24 with SOB, dysuria, acute on chronic CHF.  PMHx: morbid obesity, arthritis, Asthma, HFpEF, COPD (on home O2), depression (still dealing with son's tragic death from last year), HTN, gout, GERD, DM, bipolar, OSA, tobacco use, HLD, pulmonary edema, thyroid  CA, anasarca.     Clinical Impressions PT admitted with SOB and acute CHF. Pt currently with functional limitiations due to the deficits listed below (see OT problem list). Pt at this time requires 3L [place] O2 with grooming / bathing task which normally is 2L at home. Pt has an aide at home. Pt appears to be close to prior level of care at this time but with deconditioning due to increased O2 needs. Pt states yall are not hearing me. I need oxygen  I dont breath the same. Pt also internally distracted by medication management and states multiple times her need for various medications that are on a different schedule at the hospital than home routine.  Pt will benefit from skilled OT to increase their independence and safety with adls and balance to allow discharge home.      If plan is discharge home, recommend the following:   Assist for transportation     Functional Status Assessment   Patient has had a recent decline in their functional status and demonstrates the ability to make significant improvements in function in a reasonable and predictable amount of time.     Equipment Recommendations   None recommended by OT     Recommendations for Other Services   PT consult     Precautions/Restrictions   Precautions Precautions: Fall;Other (comment) Recall of Precautions/Restrictions: Impaired Precaution/Restrictions Comments: watch sats, required 6L for gait Restrictions Weight Bearing Restrictions Per Provider Order: No     Mobility Bed  Mobility               General bed mobility comments: pt sitting eob at end of session for lunch    Transfers Overall transfer level: Needs assistance   Transfers: Sit to/from Stand Sit to Stand: Supervision           General transfer comment: pt able to manage O2 line and RW during session      Balance Overall balance assessment: Needs assistance Sitting-balance support: No upper extremity supported, Feet supported Sitting balance-Leahy Scale: Good     Standing balance support: Bilateral upper extremity supported, During functional activity, Reliant on assistive device for balance Standing balance-Leahy Scale: Fair Standing balance comment: RW                           ADL either performed or assessed with clinical judgement   ADL Overall ADL's : Needs assistance/impaired Eating/Feeding: Independent   Grooming: Wash/dry hands;Wash/dry face;Oral care;Modified independent;Sitting Grooming Details (indicate cue type and reason): sink level grooming on arrival Upper Body Bathing: Modified independent;Sitting   Lower Body Bathing: Modified independent;Sitting/lateral leans Lower Body Bathing Details (indicate cue type and reason): pt able to figure 4 cross and reach feet     Lower Body Dressing: Modified independent;Sit to/from stand Lower Body Dressing Details (indicate cue type and reason): able to figure 4 cross to don and doff ted hose on legs. pt doff underwear and pants and hand washed them in the sink Toilet Transfer: Supervision/safety;BSC/3in1;Rolling walker (2 wheels)  General ADL Comments: pt noted to have skin break down on abdomen and reports normally using a medication lotion on sink. pt actually has prior admission print out in room with medication listed. OT contacted MD / pharmacy to request medication for skin to prevent break down. Pt might benefit in the future with interdry to keep skin dry     Vision Patient Visual  Report: No change from baseline       Perception         Praxis         Pertinent Vitals/Pain Pain Assessment Pain Assessment: No/denies pain     Extremity/Trunk Assessment Upper Extremity Assessment Upper Extremity Assessment: Overall WFL for tasks assessed   Lower Extremity Assessment Lower Extremity Assessment: Overall WFL for tasks assessed   Cervical / Trunk Assessment Cervical / Trunk Assessment: Other exceptions Cervical / Trunk Exceptions: increased body habitus   Communication Communication Communication: No apparent difficulties   Cognition Arousal: Alert Behavior During Therapy: Agitated, Anxious Cognition: No family/caregiver present to determine baseline             OT - Cognition Comments: pt able to recall generalized information, medications and the course of the day showing good recall .                 Following commands: Intact       Cueing  General Comments   Cueing Techniques: Verbal cues  monitor off on arrival due to bathing at sink . pt refusing to let CNA place lines back on patient during session due to lunch arrival. pt reports she will not let her food get cold and stop eating.   Exercises     Shoulder Instructions      Home Living Family/patient expects to be discharged to:: Private residence Living Arrangements: Alone Available Help at Discharge: Personal care attendant Type of Home: Apartment Home Access: Level entry     Home Layout: One level     Bathroom Shower/Tub: Tub/shower unit;Sponge bathes at baseline   [employer]: Standard     Home Equipment: Agricultural consultant (2 wheels);Rollator (4 wheels)   Additional Comments: has an aide M-F for 2 hours; wears 2L O2 baseline, reports turning it up to 5L when SOB but does not regularly check SPO2 at these times      Prior Functioning/Environment Prior Level of Function : Needs assist             Mobility Comments: mod I with transfers ADLs  Comments: aide assists with LB ADL, assist for bathing, difficulty with shoes    OT Problem List: Decreased activity tolerance;Impaired balance (sitting and/or standing);Decreased safety awareness;Decreased knowledge of use of DME or AE;Decreased knowledge of precautions;Cardiopulmonary status limiting activity;Obesity   OT Treatment/Interventions: Self-care/ADL training;Therapeutic exercise;Energy conservation;DME and/or AE instruction;Therapeutic activities;Patient/family education;Balance training      OT Goals(Current goals can be found in the care plan section)   Acute Rehab OT Goals Patient Stated Goal: to breath better OT Goal Formulation: With patient Time For Goal Achievement: 02/05/24 Potential to Achieve Goals: Good   OT Frequency:  Min 2X/week    Co-evaluation              AM-PAC OT 6 Clicks Daily Activity     Outcome Measure Help from another person eating meals?: None Help from another person taking care of personal grooming?: None Help from another person toileting, which includes using toliet, bedpan, or urinal?: A Little Help from another person bathing (including  washing, rinsing, drying)?: A Little Help from another person to put on and taking off regular upper body clothing?: None Help from another person to put on and taking off regular lower body clothing?: A Little 6 Click Score: 21   End of Session Equipment Utilized During Treatment: Oxygen  Nurse Communication: Mobility status;Precautions  Activity Tolerance: Patient tolerated treatment well Patient left: in bed;with call bell/phone within reach;Other (comment) (CNA in room)  OT Visit Diagnosis: Unsteadiness on feet (R26.81);Muscle weakness (generalized) (M62.81)                Time: 8863-8798 OT Time Calculation (min): 25 min Charges:  OT General Charges $OT Visit: 1 Visit OT Evaluation $OT Eval Moderate Complexity: 1 Mod   Brynn, OTR/L  Acute Rehabilitation Services Office:  [phone] .   Ely Molt 01/22/2024, 3:21 PM

## 2024-01-22 NOTE — Telephone Encounter (Signed)
 Called pt to address her appt that she has scheduled tomorrow, also to advise her about the message received from the pharmacy regarding her insurance, needing a new insurance card needed on file at the pharmacy for refills. After introducing myself, pt states what is it, please tell me this is important, they are running tests on me. I told her I could call back and pt stated go ahead. I stated that I was calling for 2 things, first regarding the appt we have scheduled for her tomorrow. Pt cuts me off stating, see, this is what I am talking about, they call me aggravating me, please let me go and disconnects the call.  No attempt will be made to call the pt back.

## 2024-01-22 NOTE — Progress Notes (Signed)
  Progress Note  Patient Name: Michele Owens Date of Encounter: 01/22/2024 [facility] Cardiologist: Ezra Shuck, MD   Interval Summary   No events overnight.  She was able to come off BiPAP and has been on nasal cannula.  She reports improvement in her dyspnea today.  Vital Signs Vitals:   01/22/24 0200 01/22/24 0231 01/22/24 0429 01/22/24 0927  BP: (!) 114/55 (!) 114/55 108/62   Pulse: (!) 57 72 61   Resp: 15 (!) 24    Temp:  98.4 F (36.9 C) 97.6 F (36.4 C)   TempSrc:  Oral Oral   SpO2: 92% 92% 90% 94%  Weight:   (!) 148.1 kg   Height:   5' 5 (1.651 m)     Intake/Output Summary (Last 24 hours) at 01/22/2024 1004 Last data filed at 01/22/2024 0925 Gross per 24 hour  Intake 677.77 ml  Output 2700 ml  Net -2022.23 ml      01/22/2024    4:29 AM 01/21/2024    1:20 PM 01/20/2024    8:37 PM  Last 3 Weights  Weight (lbs) 326 lb 8 oz 314 lb 305 lb  Weight (kg) 148.1 kg 142.429 kg 138.347 kg      Telemetry/ECG  Sinus bradycardia - Personally Reviewed  Physical Exam  GEN: No acute distress.   Neck: No JVD Cardiac: RRR, no murmurs, rubs, or gallops.  Respiratory: Clear to auscultation bilaterally. GI: Soft, nontender, non-distended  MS: No edema  Assessment & Plan  #DOE/CP w/ troponin elevation #Troponin elevation #PVCs #OHS/OSA/COPD on 2L #HFpEF 55-60% 2/24 #DM #Obesity 51.65 - Reports improvement in symptoms, though hard to assess on exam due to BMI. -Troponin was initially elevated from 6 to 200, but but then next one came back to 6.  I suspected that 200 was likely erroneous, hence no concern for NSTEMI at this time.  We will DC the heparin . - Cont IV lasix  40 mg TID, coreg  6.25 mg BID, losartan  25 daily, rosuvastatin  20, and aspirin  81 - Hold Dapa given recent UTI - Cont coreg  6.25 mg BID and losartan  25 daily - Follow up with TTE  For questions or updates, please contact [facility] Please consult [email or web address] for contact  info under   Signed, Joelle VEAR Ren Donley, MD

## 2024-01-22 NOTE — Progress Notes (Signed)
 Heart Failure Navigator Progress Note  Assessed for Heart & Vascular TOC clinic readiness.  Patient does not meet criteria due to she is an Advanced Heart Failure Team patient of Dr. Rolan. .   Navigator will sign off at this time.   Stephane Haddock, BSN, Scientist, clinical (histocompatibility and immunogenetics) Only

## 2024-01-22 NOTE — Plan of Care (Signed)
  Problem: Education: Goal: Ability to describe self-care measures that may prevent or decrease complications (Diabetes Survival Skills Education) will improve Outcome: Progressing   Problem: Coping: Goal: Ability to adjust to condition or change in health will improve Outcome: Progressing   Problem: Fluid Volume: Goal: Ability to maintain a balanced intake and output will improve Outcome: Progressing   Problem: Fluid Volume: Goal: Ability to maintain a balanced intake and output will improve Outcome: Progressing   Problem: Health Behavior/Discharge Planning: Goal: Ability to identify and utilize available resources and services will improve Outcome: Progressing   Problem: Health Behavior/Discharge Planning: Goal: Ability to manage health-related needs will improve Outcome: Progressing   Problem: Metabolic: Goal: Ability to maintain appropriate glucose levels will improve Outcome: Progressing   Problem: Education: Goal: Knowledge of General Education information will improve Description: Including pain rating scale, medication(s)/side effects and non-pharmacologic comfort measures Outcome: Progressing

## 2024-01-22 NOTE — Telephone Encounter (Signed)
 Spoke to Pt to follow up on concerns and advise she has an upcoming appointment with Dr. Sebastian MD on 01/23/2024 ED f/u. Pt became upset with yelling and stating that OV was not approved by her or agreed to and will not come to appointment. Pt also voiced we are making her very upset and she has an appointment with the lawyers office today. Pt also mentioned she is currently in the ED and was admitted at [facility]. I was trying to speak with patient but she cut me off and told me be to safe and hung up the phone.   OV was approved by Dr. Sebastian MD same day due to Pt request; I spoke to front desk staff Rollo) and also confirmation with teams message regarding OV and Pt agreed to time and date on 01/18/2024 @ 10:28AM

## 2024-01-22 NOTE — Evaluation (Signed)
 Physical Therapy Evaluation Patient Details Name: Michele Owens MRN: [id number] DOB: 1960-06-12 Today's Date: 01/22/2024  History of Present Illness  63 y/o F adm 01/21/24 with SOB, dysuria, acute on chronic CHF.  PMHx: morbid obesity, arthritis, Asthma, HFpEF, COPD (on home O2), depression (still dealing with son's tragic death from last year), HTN, gout, GERD, DM, bipolar, OSA, tobacco use, HLD, pulmonary edema, thyroid  CA, anasarca.  Clinical Impression  Pt sitting EOB on arrival with pt particular about wanting shoes, purse and back gown prior to mobility. Pt requested not to be spoken to during walk as it makes her nervous. Pt lives at home alone with PCA and walks with RW. Pt requires 3L at rest and 6L during gait this session to maintain SPO2 >90% with pt educated for need to monitor SPO2 with pulse ox rather than just turn up O2. Pt with decreased activity tolerance and function who will benefit from acute therapy and HHPT to maximize independence.   89-92% on 6L with gait 92% on 3L at rest        If plan is discharge home, recommend the following: A little help with walking and/or transfers;A little help with bathing/dressing/bathroom;Assistance with cooking/housework;Assist for transportation   Can travel by private vehicle        Equipment Recommendations None recommended by PT  Recommendations for Other Services       Functional Status Assessment Patient has had a recent decline in their functional status and/or demonstrates limited ability to make significant improvements in function in a reasonable and predictable amount of time     Precautions / Restrictions Precautions Precautions: Fall;Other (comment) Recall of Precautions/Restrictions: Impaired Precaution/Restrictions Comments: watch sats, required 6L for gait      Mobility  Bed Mobility               General bed mobility comments: pt sitting EOB on arrival    Transfers Overall transfer level:  Needs assistance   Transfers: Sit to/from Stand Sit to Stand: Supervision           General transfer comment: assist for lines and safety with pt able to perform transfers without physical assist    Ambulation/Gait Ambulation/Gait assistance: Supervision Gait Distance (Feet): 220 Feet Assistive device: Rolling walker (2 wheels) Gait Pattern/deviations: Step-through pattern, Decreased stride length   Gait velocity interpretation: 1.31 - 2.62 ft/sec, indicative of limited community ambulator   General Gait Details: cues for pursed lip breathing, 2 standing rests with pt requiring 6L during gait to maintain SPO2 92%, drop to 85% on 3L with gait. Pt becomes anxious with SPO2 drop and needs cues to breathe and check SPO2  Stairs            Wheelchair Mobility     Tilt Bed    Modified Rankin (Stroke Patients Only)       Balance Overall balance assessment: Needs assistance Sitting-balance support: No upper extremity supported, Feet supported Sitting balance-Leahy Scale: Fair     Standing balance support: Bilateral upper extremity supported, During functional activity, Reliant on assistive device for balance Standing balance-Leahy Scale: Poor Standing balance comment: Rw with gait                             Pertinent Vitals/Pain Pain Assessment Pain Assessment: No/denies pain    Home Living Family/patient expects to be discharged to:: Private residence Living Arrangements: Alone Available Help at Discharge: Personal care attendant Type of Home:  Apartment Home Access: Level entry       Home Layout: One level Home Equipment: Agricultural consultant (2 wheels);Rollator (4 wheels) Additional Comments: has an aide M-F for 2 hours; wears 2L O2 baseline, reports turning it up to 5L when SOB but does not regularly check SPO2 at these times    Prior Function Prior Level of Function : Needs assist             Mobility Comments: mod I with transfers ADLs  Comments: aide assists with LB ADL, assist for bathing, difficulty with shoes     Extremity/Trunk Assessment   Upper Extremity Assessment Upper Extremity Assessment: Generalized weakness    Lower Extremity Assessment Lower Extremity Assessment: Generalized weakness    Cervical / Trunk Assessment Cervical / Trunk Assessment: Other exceptions Cervical / Trunk Exceptions: increased body habitus  Communication        Cognition Arousal: Alert Behavior During Therapy: Agitated, Anxious   PT - Cognitive impairments: Safety/Judgement, Problem solving                       PT - Cognition Comments: pt easily agitated at times then will apologize, stating someone stole her makeup and lotion but unsure if it is at home Following commands: Intact       Cueing Cueing Techniques: Verbal cues     General Comments      Exercises     Assessment/Plan    PT Assessment Patient needs continued PT services  PT Problem List Decreased activity tolerance;Cardiopulmonary status limiting activity;Decreased mobility;Decreased cognition       PT Treatment Interventions DME instruction;Balance training;Gait training;Functional mobility training;Therapeutic activities;Therapeutic exercise;Cognitive remediation;Patient/family education    PT Goals (Current goals can be found in the Care Plan section)  Acute Rehab PT Goals Patient Stated Goal: return home, get lotion PT Goal Formulation: With patient Time For Goal Achievement: 02/05/24 Potential to Achieve Goals: Fair    Frequency Min 1X/week     Co-evaluation               AM-PAC PT 6 Clicks Mobility  Outcome Measure Help needed turning from your back to your side while in a flat bed without using bedrails?: A Little Help needed moving from lying on your back to sitting on the side of a flat bed without using bedrails?: A Little Help needed moving to and from a bed to a chair (including a wheelchair)?: A Little Help  needed standing up from a chair using your arms (e.g., wheelchair or bedside chair)?: A Little Help needed to walk in hospital room?: A Little Help needed climbing 3-5 steps with a railing? : A Little 6 Click Score: 18    End of Session Equipment Utilized During Treatment: Oxygen  Activity Tolerance: Patient tolerated treatment well Patient left: in chair;with call bell/phone within reach Nurse Communication: Mobility status PT Visit Diagnosis: Other abnormalities of gait and mobility (R26.89);Difficulty in walking, not elsewhere classified (R26.2);Muscle weakness (generalized) (M62.81)    Time: 9166-9096 PT Time Calculation (min) (ACUTE ONLY): 30 min   Charges:   PT Evaluation $PT Eval Moderate Complexity: 1 Mod PT Treatments $Gait Training: 8-22 mins PT General Charges $$ ACUTE PT VISIT: 1 Visit         Lenoard SQUIBB, PT Acute Rehabilitation Services Office: [phone]   Lenoard NOVAK Quorra Rosene 01/22/2024, 10:23 AM

## 2024-01-22 NOTE — Progress Notes (Addendum)
 PROGRESS NOTE    Michele Owens  FMW:[id number] DOB: [birth year] DOA: 01/21/2024 PCP: [name], MD  62/F, morbidly obese, BMI 55, diastolic CHF, suspected OSA OHS, type 2 diabetes mellitus, hypertension, thyroid  malignancy, hyperlipidemia, hypothyroidism, chronic respiratory failure, COPD, bipolar disorder has been seen in the ER several times over the last few weeks, presented on 10/8 with dyspnea, CTA chest was negative for PE, prescribed Pyridium due to concern for interstitial cystitis, already on Keflex  at that time for presumed UTI.  Back in the ER 10/13 with dyspnea on exertion, urinary incontinence, questionable compliance with torsemide .  In the ED hypoxic O2 sats were 71% on room air, on 2 to 3 L O2 at baseline, labs noted mild leukocytosis, BNP 61, creatinine 0.8, D-dimer 0.3, chest x-ray with cardiomegaly and pulmonary vascular congestion   Subjective: -Lost her IV, upset that she needs another IV placed, requesting for medication for headache that she got in the ER last night  Assessment and Plan:  Acute on chronic diastolic congestive heart failure.   - Recent limited compliance with torsemide   - Continue Lasix  40 mg 3 times daily, losartan , Coreg   - Avoid SGLT2 i with history of UTIs  - Monitor urine output, follow-up repeat echo  - Increase activity   Elevated troponin chest discomfort.  EKG without any acute changes.   - Repeat elevated troponin yesterday was likely lab error, IV heparin  discontinued, no plans for LHC    class III obesity, obesity hypoventilation syndrome, obstructive sleep apnea and mild COPD Chronic respiratory failure on 2-3L home O2 Body mass index is 52.25 kg/m.  - Highly suspect OSA, reports that she has had a sleep study in the past which was negative for OSA, she needs another sleep study as outpatient -Attempt to wean O2   PVCs, history of SVT.   -Received magnesium  sulfate in the ED yesterday.   -Continue beta-blockers.    History of hyperlipidemia Continue fenofibrate , Crestor    Recent UTI.  Was having some incontinence and was not taking torsemide .  Was on Keflex  will continue to complete the course.   Hypothyroidism Continue Synthroid .  - TSH significantly elevated at 35, will repeat and check free T4   Bipolar disorder Continue Latuda    Controlled diabetes mellitus type 2. -  Latest hemoglobin A1c of 6.8, about 4 months back.  - Follow-up repeat     DVT prophylaxis: Lovenox  Code Status: Full code Family Communication: None present Disposition Plan: Home pending improvement in respiratory status    Objective: Vitals:   01/22/24 0231 01/22/24 0429 01/22/24 0927 01/22/24 1013  BP: (!) 114/55 108/62    Pulse: 72 61    Resp: (!) 24     Temp: 98.4 F (36.9 C) 97.6 F (36.4 C)    TempSrc: Oral Oral    SpO2: 92% 90% 94% 92%  Weight:  (!) 148.1 kg    Height:  5' 5 (1.651 m)      Intake/Output Summary (Last 24 hours) at 01/22/2024 1019 Last data filed at 01/22/2024 9074 Gross per 24 hour  Intake 677.77 ml  Output 2700 ml  Net -2022.23 ml   Filed Weights   01/21/24 1320 01/22/24 0429  Weight: (!) 142.4 kg (!) 148.1 kg    Examination:  General exam: Chronically ill morbidly obese, AO x 3 HEENT: Neck obese unable to assess JVD Respiratory system: Distant breath sounds Cardiovascular system: S1 & S2 heard, RRR.  Abd: nondistended, soft and nontender.Normal bowel sounds heard. Central nervous  system: Alert and oriented. No focal neurological deficits. Extremities: 1+ edema Skin: No rashes Psychiatry:  Mood & affect appropriate.     Data Reviewed:   CBC: Recent Labs  Lab 01/16/24 1406 01/20/24 2050 01/21/24 1322 01/21/24 1339 01/22/24 0140  WBC 14.2* 14.6* 15.4*  --  15.4*  NEUTROABS 11.3* 11.8* 12.4*  --   --   HGB 11.2* 10.6* 11.1* 12.2 12.0  HCT 38.1 35.8* 36.2 36.0 38.6  MCV 98.7 101.4* 98.4  --  97.0  PLT 291 333 339  --  385   Basic Metabolic  Panel: Recent Labs  Lab 01/16/24 1406 01/20/24 2050 01/21/24 1322 01/21/24 1339 01/21/24 1729 01/22/24 0140  NA 143 141 138 139  --  140  K 3.7 4.5 4.0 4.4  --  4.0  CL 100 98 93*  --   --  94*  CO2 32 34* 34*  --   --  33*  GLUCOSE 127* 123* 140*  --   --  193*  BUN 14 8 9   --   --  8  CREATININE 0.81 0.78 0.86  --   --  0.83  CALCIUM  8.7* 8.5* 8.5*  --   --  8.6*  MG  --   --   --   --  1.8 2.4   GFR: Estimated Creatinine Clearance: 102.3 mL/min (by C-G formula based on SCr of 0.83 mg/dL). Liver Function Tests: Recent Labs  Lab 01/16/24 1406 01/20/24 2050 01/21/24 1322  AST 21 24 27   ALT 12 26 24   ALKPHOS 74 60 64  BILITOT 0.4 0.6 0.9  PROT 6.9 6.5 6.7  ALBUMIN  4.2 3.4* 3.6   Recent Labs  Lab 01/16/24 1406 01/21/24 1511  LIPASE 28 25   No results for input(s): AMMONIA  in the last 168 hours. Coagulation Profile: No results for input(s): INR, PROTIME in the last 168 hours. Cardiac Enzymes: No results for input(s): CKTOTAL, CKMB, CKMBINDEX, TROPONINI in the last 168 hours. BNP (last 3 results) Recent Labs    01/16/24 2043  PROBNP 183.0   HbA1C: Recent Labs    01/22/24 0140  HGBA1C 6.9*   CBG: Recent Labs  Lab 01/20/24 2043 01/21/24 2202 01/22/24 0626  GLUCAP 116* 282* 162*   Lipid Profile: Recent Labs    01/22/24 0140  CHOL 185  HDL 64  LDLCALC 87  TRIG 172*  CHOLHDL 2.9   Thyroid  Function Tests: Recent Labs    01/21/24 1800  TSH 35.973*   Anemia Panel: No results for input(s): VITAMINB12, FOLATE, FERRITIN, TIBC, IRON, RETICCTPCT in the last 72 hours. Urine analysis:    Component Value Date/Time   COLORURINE YELLOW 01/20/2024 2050   APPEARANCEUR CLEAR 01/20/2024 2050   LABSPEC 1.018 01/20/2024 2050   PHURINE 6.0 01/20/2024 2050   GLUCOSEU >=500 (A) 01/20/2024 2050   HGBUR NEGATIVE 01/20/2024 2050   BILIRUBINUR NEGATIVE 01/20/2024 2050   KETONESUR NEGATIVE 01/20/2024 2050   PROTEINUR NEGATIVE  01/20/2024 2050   UROBILINOGEN 0.2 02/28/2012 2004   NITRITE NEGATIVE 01/20/2024 2050   LEUKOCYTESUR SMALL (A) 01/20/2024 2050   Sepsis Labs: @LABRCNTIP (procalcitonin:4,lacticidven:4)  ) Recent Results (from the past 240 hours)  Resp panel by RT-PCR (RSV, Flu A&B, Covid) Anterior Nasal Swab     Status: None   Collection Time: 01/21/24  2:14 PM   Specimen: Anterior Nasal Swab  Result Value Ref Range Status   SARS Coronavirus 2 by RT PCR NEGATIVE NEGATIVE Final   Influenza A by PCR NEGATIVE NEGATIVE Final  Influenza B by PCR NEGATIVE NEGATIVE Final    Comment: (NOTE) The Xpert Xpress SARS-CoV-2/FLU/RSV plus assay is intended as an aid in the diagnosis of influenza from Nasopharyngeal swab specimens and should not be used as a sole basis for treatment. Nasal washings and aspirates are unacceptable for Xpert Xpress SARS-CoV-2/FLU/RSV testing.  Fact Sheet for Patients: BloggerCourse.com  Fact Sheet for Healthcare Providers: SeriousBroker.it  This test is not yet approved or cleared by the [place]  FDA and has been authorized for detection and/or diagnosis of SARS-CoV-2 by FDA under an Emergency Use Authorization (EUA). This EUA will remain in effect (meaning this test can be used) for the duration of the COVID-19 declaration under Section 564(b)(1) of the Act, 21 U.S.C. section 360bbb-3(b)(1), unless the authorization is terminated or revoked.     Resp Syncytial Virus by PCR NEGATIVE NEGATIVE Final    Comment: (NOTE) Fact Sheet for Patients: BloggerCourse.com  Fact Sheet for Healthcare Providers: SeriousBroker.it  This test is not yet approved or cleared by the [place]  FDA and has been authorized for detection and/or diagnosis of SARS-CoV-2 by FDA under an Emergency Use Authorization (EUA). This EUA will remain in effect (meaning this test can be used) for the  duration of the COVID-19 declaration under Section 564(b)(1) of the Act, 21 U.S.C. section 360bbb-3(b)(1), unless the authorization is terminated or revoked.  Performed at [facility], [address], [place], [place] [address]      Radiology Studies: DG Chest 2 View Result Date: 01/21/2024 CLINICAL DATA:  Worsening shortness of breath. EXAM: CHEST - 2 VIEW COMPARISON:  January 20, 2024 FINDINGS: The cardiac silhouette is enlarged and unchanged in size. There is marked severity calcification of the aortic arch. There is mild, stable prominence of the central pulmonary vasculature. Mild linear scarring and/or atelectasis is seen within the mid right lung and mid to lower left lung. No pleural effusion or pneumothorax is identified. Multilevel degenerative changes are seen throughout the thoracic spine. IMPRESSION: 1. Cardiomegaly with mild prominence of the central pulmonary vasculature. 2. Mild mid right lung and mid to lower left lung linear scarring and/or atelectasis. Electronically Signed   By: Suzen Dials M.D.   On: 01/21/2024 14:34   DG Chest Portable 1 View Result Date: 01/20/2024 CLINICAL DATA:  Shortness of breath EXAM: PORTABLE CHEST 1 VIEW COMPARISON:  01/15/2024 FINDINGS: Cardiac shadow is prominent similar to that seen on the prior exam. Overlying soft tissue density is noted. The lungs are clear bilaterally. No bony abnormality is seen. 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 No nodular lesions are not well appreciated on this exam. IMPRESSION: Prominent overlying soft tissue.  No focal abnormality is noted. Electronically Signed   By: Oneil Devonshire M.D.   On: 01/20/2024 21:17     Scheduled Meds:  allopurinol   100 mg Oral Daily   aspirin  EC  81 mg Oral Daily   budesonide   0.25 mg Nebulization BID   busPIRone   30 mg Oral BID   carvedilol   6.25 mg Oral BID   cephALEXin   500 mg Oral TID   dicyclomine   20 mg Oral BID   enoxaparin  (LOVENOX ) injection  40 mg  Subcutaneous Q24H   fenofibrate   160 mg Oral Daily   furosemide   40 mg Intravenous TID   gabapentin  100 mg Oral TID   insulin  aspart  0-15 Units Subcutaneous TID WC   insulin  aspart  0-5 Units Subcutaneous QHS   levothyroxine   125 mcg Oral QHS  loratadine   10 mg Oral QHS   losartan   25 mg Oral Daily   lurasidone   20 mg Oral QHS   pantoprazole   40 mg Oral Daily   potassium chloride   10 mEq Oral BID   predniSONE   40 mg Oral Q breakfast   rosuvastatin   20 mg Oral QPM   sodium chloride  flush  3 mL Intravenous Q12H   umeclidinium-vilanterol  1 puff Inhalation Daily   Vitamin D  (Ergocalciferol )  50,000 Units Oral Q Tue   Continuous Infusions:  sodium chloride        LOS: 1 day    Time spent:    Sigurd Pac, MD [facility]   01/22/2024, 10:19 AM

## 2024-01-22 NOTE — Progress Notes (Signed)
 Echocardiogram 2D Echocardiogram has been performed.  Juliene JINNY Rucks 01/22/2024, 3:17 PM

## 2024-01-22 NOTE — Progress Notes (Signed)
 PHARMACY - ANTICOAGULATION CONSULT NOTE  Pharmacy Consult for heparin  Indication: chest pain/ACS  Patient Measurements: Height: 5' 5 (165.1 cm) Weight: (!) 142.4 kg (314 lb) IBW/kg (Calculated) : 57 HEPARIN  DW (KG): 92.6  Vital Signs: Temp: 98.8 F (37.1 C) (10/13 2310) Temp Source: Oral (10/13 2310) BP: 114/55 (10/14 0200) Pulse Rate: 57 (10/14 0200)  Labs: Recent Labs    01/20/24 2050 01/21/24 1322 01/21/24 1339 01/21/24 1511 01/21/24 1729 01/21/24 2002 01/22/24 0140  HGB 10.6* 11.1* 12.2  --   --   --  12.0  HCT 35.8* 36.2 36.0  --   --   --  38.6  PLT 333 339  --   --   --   --  385  HEPARINUNFRC  --   --   --   --   --   --  <0.10*  CREATININE 0.78 0.86  --   --   --   --   --   TROPONINIHS 5 6  --  210*  210* 6 6  --     Estimated Creatinine Clearance: 96.4 mL/min (by C-G formula based on SCr of 0.86 mg/dL).  Assessment: Patient is a 63 YO F presenting to the ED with shortness of breath. Troponins rising 6 to 210. Cardiology consulted and ECHO ordered. Pharmacy has been consulted to dose heparin  for ACS.  Patient not on anticoagulation PTA. Hgb and plt WNL.   AM: heparin  level undetectable on 1200 units/hr. Per RN heparin  gtt has not been running continuously given pauses. CBC stable. Trop now 6  Goal of Therapy:  Heparin  level 0.3-0.7 units/ml Monitor platelets by anticoagulation protocol: Yes   Plan:  -Continue heparin  infusion at 1200 units/h.  -Check heparin  level in 6h.  -Monitor heparin , CBC daily -F/u plans for [facility]   Lynwood Poplar, PharmD, BCPS Clinical Pharmacist 01/22/2024 2:10 AM

## 2024-01-22 NOTE — Telephone Encounter (Signed)
 Fax communication received that pt did not schedule, so the referral will be closed. Top re-open the referral please call [phone]. Fax [phone].  [facility]  [address] [place], [place] [address]

## 2024-01-23 ENCOUNTER — Other Ambulatory Visit (HOSPITAL_COMMUNITY): Payer: Self-pay

## 2024-01-23 ENCOUNTER — Ambulatory Visit: Admitting: Family Medicine

## 2024-01-23 DIAGNOSIS — J449 Chronic obstructive pulmonary disease, unspecified: Secondary | ICD-10-CM

## 2024-01-23 DIAGNOSIS — F319 Bipolar disorder, unspecified: Secondary | ICD-10-CM

## 2024-01-23 DIAGNOSIS — I5033 Acute on chronic diastolic (congestive) heart failure: Secondary | ICD-10-CM | POA: Diagnosis not present

## 2024-01-23 DIAGNOSIS — E785 Hyperlipidemia, unspecified: Secondary | ICD-10-CM

## 2024-01-23 DIAGNOSIS — G4733 Obstructive sleep apnea (adult) (pediatric): Secondary | ICD-10-CM

## 2024-01-23 DIAGNOSIS — E66813 Obesity, class 3: Secondary | ICD-10-CM

## 2024-01-23 DIAGNOSIS — I10 Essential (primary) hypertension: Secondary | ICD-10-CM

## 2024-01-23 DIAGNOSIS — E1169 Type 2 diabetes mellitus with other specified complication: Secondary | ICD-10-CM | POA: Diagnosis not present

## 2024-01-23 DIAGNOSIS — E890 Postprocedural hypothyroidism: Secondary | ICD-10-CM

## 2024-01-23 LAB — BASIC METABOLIC PANEL WITH GFR
Anion gap: 11 (ref 5–15)
BUN: 16 mg/dL (ref 8–23)
CO2: 37 mmol/L — ABNORMAL HIGH (ref 22–32)
Calcium: 8.6 mg/dL — ABNORMAL LOW (ref 8.9–10.3)
Chloride: 91 mmol/L — ABNORMAL LOW (ref 98–111)
Creatinine, Ser: 0.99 mg/dL (ref 0.44–1.00)
GFR, Estimated: >60 mL/min (ref >60)
Glucose, Bld: 113 mg/dL — ABNORMAL HIGH (ref 70–99)
Potassium: 3.7 mmol/L (ref 3.5–5.1)
Sodium: 139 mmol/L (ref 135–145)

## 2024-01-23 LAB — CBC
HCT: 37.6 % (ref 36.0–46.0)
Hemoglobin: 11.6 g/dL — ABNORMAL LOW (ref 12.0–15.0)
MCH: 30.1 pg (ref 26.0–34.0)
MCHC: 30.9 g/dL (ref 30.0–36.0)
MCV: 97.4 fL (ref 80.0–100.0)
Platelets: 366 K/uL (ref 150–400)
RBC: 3.86 MIL/uL — ABNORMAL LOW (ref 3.87–5.11)
RDW: 13.7 % (ref 11.5–15.5)
WBC: 18.8 K/uL — ABNORMAL HIGH (ref 4.0–10.5)
nRBC: 0 % (ref 0.0–0.2)

## 2024-01-23 LAB — GLUCOSE, CAPILLARY
Glucose-Capillary: 116 mg/dL — ABNORMAL HIGH (ref 70–99)
Glucose-Capillary: 177 mg/dL — ABNORMAL HIGH (ref 70–99)

## 2024-01-23 LAB — T4, FREE: Free T4: 0.63 ng/dL (ref 0.61–1.12)

## 2024-01-23 LAB — TSH: TSH: 67.29 u[IU]/mL — ABNORMAL HIGH (ref 0.350–4.500)

## 2024-01-23 MED ORDER — TORSEMIDE 20 MG PO TABS
20.0000 mg | ORAL_TABLET | Freq: Once | ORAL | Status: AC
  Administered 2024-01-23: 20 mg via ORAL
  Filled 2024-01-23: qty 1

## 2024-01-23 MED ORDER — SPIRONOLACTONE 25 MG PO TABS
12.5000 mg | ORAL_TABLET | Freq: Every day | ORAL | 0 refills | Status: DC
  Filled 2024-01-23: qty 15, 30d supply, fill #0

## 2024-01-23 MED ORDER — RIZATRIPTAN BENZOATE 10 MG PO TABS: 10.0000 mg | ORAL_TABLET | ORAL | 0 refills | Status: DC | PRN

## 2024-01-23 MED ORDER — RIZATRIPTAN BENZOATE 10 MG PO TABS
10.0000 mg | ORAL_TABLET | ORAL | 0 refills | Status: DC | PRN
  Filled 2024-01-23: qty 10, 18d supply, fill #0

## 2024-01-23 MED ORDER — TORSEMIDE 20 MG PO TABS
40.0000 mg | ORAL_TABLET | Freq: Two times a day (BID) | ORAL | 0 refills | Status: DC
  Filled 2024-01-23: qty 120, 30d supply, fill #0

## 2024-01-23 MED ORDER — ORAL CARE MOUTH RINSE: 15.0000 mL | OROMUCOSAL | Status: DC | PRN

## 2024-01-23 MED ORDER — SPIRONOLACTONE 12.5 MG HALF TABLET
12.5000 mg | ORAL_TABLET | Freq: Every day | ORAL | Status: DC
  Administered 2024-01-23: 12.5 mg via ORAL
  Filled 2024-01-23: qty 1

## 2024-01-23 MED ORDER — IPRATROPIUM-ALBUTEROL 0.5-2.5 (3) MG/3ML IN SOLN
3.0000 mL | RESPIRATORY_TRACT | 0 refills | Status: DC | PRN
  Filled 2024-01-23: qty 360, 20d supply, fill #0

## 2024-01-23 MED ORDER — ACETAMINOPHEN 325 MG PO TABS: 650.0000 mg | ORAL_TABLET | Freq: Four times a day (QID) | ORAL | Status: DC | PRN

## 2024-01-23 NOTE — Progress Notes (Signed)
  Progress Note  Patient Name: Michele Owens Date of Encounter: 01/23/2024 [facility] Cardiologist: Ezra Shuck, MD   Interval Summary   No events overnight. She feels back to baseline.   Vital Signs Vitals:   01/22/24 2037 01/23/24 0044 01/23/24 0431 01/23/24 0445  BP:  127/72 (!) 133/90   Pulse:  (!) 58 72 73  Resp:  18 20   Temp:  98.3 F (36.8 C) 98.6 F (37 C)   TempSrc:  Oral Oral   SpO2: 92% 95% 91% 90%  Weight:      Height:        Intake/Output Summary (Last 24 hours) at 01/23/2024 0708 Last data filed at 01/22/2024 2200 Gross per 24 hour  Intake 720 ml  Output --  Net 720 ml      01/22/2024    4:29 AM 01/21/2024    1:20 PM 01/20/2024    8:37 PM  Last 3 Weights  Weight (lbs) 326 lb 8 oz 314 lb 305 lb  Weight (kg) 148.1 kg 142.429 kg 138.347 kg      Telemetry/ECG  NSR - Personally Reviewed  Physical Exam  GEN: No acute distress.   Neck: No JVD Cardiac: RRR, no murmurs, rubs, or gallops.  Respiratory: Clear to auscultation bilaterally. GI: Soft, nontender, non-distended  MS: No edema  Assessment & Plan  Ms Mccoll is a 10 yoF with Hx of worsening dyspnea over the last 3 days with some associated chest pain in setting of missing torsemide  doses. Exam and x-ray with evidence of volume overload.  #DOE, likely HFpEF exacerbation  #OHS/OSA/COPD on 2L #DM #Obesity 51.65  - Repeat TTE with stable EF 55-60%, RV pressure/volume overload (known), mod enlarged RV, IVC 3 mmHg - Transition back to home PO torsemide  20 BID - Cont coreg  6.25 mg BID, losartan  25 daily, rosuvastatin  20, and aspirin  81 - Hold Dapa given recent UTI - Will need RHC outpatient - We will sign off; feel free to call us  back if you have any questions   For questions or updates, please contact [facility] Please consult [email or web address] for contact info under   Signed, Joelle VEAR Ren Donley, MD

## 2024-01-23 NOTE — TOC Transition Note (Signed)
 Transition of Care [facility]) - Discharge Note   Patient Details  Name: Michele Owens MRN: [id number] Date of Birth: 03/12/1961  [facility]) CM/SW Contact:  Waddell Barnie Rama, RN Phone Number: 01/23/2024, 2:01 PM   Clinical Narrative:    For dc today, ptar has been arranged.     Final next level of care: Home w Home Health Services Barriers to Discharge: No Barriers Identified   Patient Goals and CMS Choice Patient states their goals for this hospitalization and ongoing recovery are:: return home   Choice offered to / list presented to : Patient      Discharge Placement                       Discharge Plan and Services Additional resources added to the After Visit Summary for     Discharge Planning Services: CM Consult Post Acute Care Choice: Home Health, Durable Medical Equipment          DME Arranged: Shower stool, Walker rolling DME Agency: [employer] Date DME Agency Contacted: 01/23/24 Time DME Agency Contacted: 1243 Representative spoke with at DME Agency: [place] HH Arranged: PT HH Agency: [facility] Date [facility] Agency Contacted: 01/23/24 Time HH Agency Contacted: 1302 Representative spoke with at [facility] Agency: k[facility]  Social Drivers of Health (SDOH) Interventions SDOH Screenings   Food Insecurity: No Food Insecurity (01/22/2024)  Housing: Unknown (01/22/2024)  Transportation Needs: No Transportation Needs (01/22/2024)  Utilities: Not At Risk (01/22/2024)  Alcohol Screen: Low Risk  (10/24/2022)  Depression (PHQ2-9): Medium Risk (12/25/2023)  Financial Resource Strain: Low Risk  (10/24/2022)  Physical Activity: Inactive (02/01/2021)  Social Connections: Moderately Isolated (01/22/2024)  Stress: Stress Concern Present (02/23/2021)  Tobacco Use: Medium Risk (01/21/2024)     Readmission Risk Interventions    01/23/2024   12:36 PM 07/09/2022   10:29 AM 05/18/2022   11:29 AM  Readmission Risk Prevention Plan   Transportation Screening Complete Complete Complete  PCP or Specialist Appt within 3-5 Days  Complete   HRI or Home Care Consult  Complete Complete  Social Work Consult for Recovery Care Planning/Counseling  Complete Complete  Palliative Care Screening  Not Applicable Complete  Medication Review [employer]) Complete Not Complete Referral to Pharmacy  PCP or Specialist appointment within 3-5 days of discharge Complete    HRI or Home Care Consult Complete    Palliative Care Screening Not Applicable    Skilled Nursing Facility Not Applicable

## 2024-01-23 NOTE — Assessment & Plan Note (Signed)
 Calculated BMI is 54.3

## 2024-01-23 NOTE — Assessment & Plan Note (Signed)
 Patient was placed on insulin  sliding scale for glucose cover and monitoring.  At the time of discharge her glucose has remained well controlled.   Continue with fenofibrate  and statin

## 2024-01-23 NOTE — Assessment & Plan Note (Signed)
 Continue with Cpap as outpatient.  Supplemental 02 per 

## 2024-01-23 NOTE — Assessment & Plan Note (Signed)
 Continue blood pressure control with losartan and carvedilol.

## 2024-01-23 NOTE — Assessment & Plan Note (Addendum)
 Continue with lurasidone  and buspirone  Follow up as outpatient.

## 2024-01-23 NOTE — Assessment & Plan Note (Addendum)
 Asthma.  Continue bronchodilator therapy.  Inhaled corticosteroids.  Supplemental 02 per [place].

## 2024-01-23 NOTE — Assessment & Plan Note (Addendum)
 Echocardiogram (technical difficult) preserved LV systolic function with EF 55 to 60%, no regional wall motion abnormalities, interventricular septum flattened in systole and diastole, RV systolic function preserved, RV size with moderate enlargement, no significant valvular disease.   Acute on chronic cor pulmonale Pulmonary hypertension RV dysfunction.   Patient was place on IV furosemide , negative fluid balance was achieved with improvement in her symptoms.   Plan to continue carvedilol , losartan  and will add spironolactone .  Diuresis with torsemide , her home dose if 40 mg po bid.  No SGLT 2 inh due to risk of urinary tract infections.  Follow up as outpatient.   Acute on chronic hypoxemic respiratory failure due to acute cardiogenic pulmonary edema. At the time of discharge her oxygenation has improved.  02 saturation 93% on 3 L/min per [place].

## 2024-01-23 NOTE — TOC Initial Note (Addendum)
 Transition of Care [facility]) - Initial/Assessment Note    Patient Details  Name: Michele Owens MRN: [id number] Date of Birth: Oct 13, 1960  Transition of Care [facility]) CM/SW Contact:    Waddell Barnie Rama, RN Phone Number: 01/23/2024, 1:03 PM  Clinical Narrative:                 From home alone, has PCP and insurance on file, states has no HH services in place at this time ,  she states she had a aide  Mon- Thurs 10:30- 12:45, and Friday 10:30- 12:30, which her last day was Friday, but [place] at the [employer] will be getting her another aide,  has a shower chair that does not work , hs home oxygen  with [employer]  , says she will be using 3.5 liters at home.  States she will need ptar transport  at [employer] and daughter is support system, states gets medications from  [facility].  Pta self ambulatory with walker.   NCM offered choice, she states she is ok with [facility],  NCM made referral thru portal for HHPT.  Referral accepted.  Also she states she would like the humidifier for her oxygen  to take home with her.  Also she wants a wider shower chair and a rolling walker.  She does not have a preference of the DME agency.  NCM made referral thru portal for [employer].  The [employer] rep Veva showed patient the picture of the DME, she wants a walker with the rubber on the handles, she saw the picture and agreed to the picture of the walker.   She was not too sure of the shower chair , so patient will bring one up for her to see.  She would like for this DME to be delivered to her home by Friday per patient.  Patient was agreeable to the shower chair that was bought up for her to see.  Expected Discharge Plan: Home w Home Health Services Barriers to Discharge: No Barriers Identified   Patient Goals and CMS Choice Patient states their goals for this hospitalization and ongoing recovery are:: return home   Choice offered to / list presented to : Patient      Expected Discharge Plan and Services    Discharge Planning Services: CM Consult Post Acute Care Choice: Home Health, Durable Medical Equipment Living arrangements for the past 2 months: Single Family Home Expected Discharge Date: 01/23/24               DME Arranged: Shower stool, Walker rolling DME Agency: [employer] Date DME Agency Contacted: 01/23/24 Time DME Agency Contacted: [phone] Representative spoke with at DME Agency: [place] HH Arranged: PT HH Agency: [facility] Date [facility] Agency Contacted: 01/23/24 Time HH Agency Contacted: [phone] Representative spoke with at [facility] Agency: kelly  Prior Living Arrangements/Services Living arrangements for the past 2 months: Single Family Home Lives with:: Self Patient language and need for interpreter reviewed:: Yes Do you feel safe going back to the place where you live?: Yes      Need for Family Participation in Patient Care: Yes (Comment) Care giver support system in place?: Yes (comment) Current home services: DME (home oxygen  with [employer] 3.5 liters) Criminal Activity/Legal Involvement Pertinent to Current Situation/Hospitalization: No - Comment as needed  Activities of Daily Living   ADL Screening (condition at time of admission) Independently performs ADLs?: No Does the patient have a NEW difficulty with bathing/dressing/toileting/self-feeding that is expected to last >3 days?: No Does  the patient have a NEW difficulty with getting in/out of bed, walking, or climbing stairs that is expected to last >3 days?: No Does the patient have a NEW difficulty with communication that is expected to last >3 days?: No Is the patient deaf or have difficulty hearing?: No Does the patient have difficulty seeing, even when wearing glasses/contacts?: No Does the patient have difficulty concentrating, remembering, or making decisions?: No  Permission Sought/Granted Permission sought to share information with : Case Manager Permission granted to share information with :  Yes, Verbal Permission Granted     Permission granted to share info w AGENCY: HH        Emotional Assessment Appearance:: Appears stated age Attitude/Demeanor/Rapport: Engaged Affect (typically observed): Anxious Orientation: : Oriented to Self, Oriented to Place, Oriented to  Time, Oriented to Situation Alcohol / Substance Use: Not Applicable Psych Involvement: No (comment)  Admission diagnosis:  CHF (congestive heart failure) (HCC) [I50.9] COPD with acute exacerbation (HCC) [J44.1] Acute on chronic hypoxic respiratory failure (HCC) [J96.21] 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) 12/07/2023   Mild episode of recurrent major depressive disorder 12/05/2023   GAD (generalized anxiety disorder) 12/05/2023   Former smoker 12/05/2023   Hypokalemia 11/13/2023   Vitamin D  deficiency 11/13/2023   Chronic idiopathic constipation 11/13/2023   Cellulitis 09/09/2023   UTI (urinary tract infection) 09/09/2023   History of COPD 06/06/2023   Bipolar disorder (HCC) 06/06/2023   At risk for polypharmacy 02/07/2023   Dependence on continuous supplemental oxygen  02/07/2023   Acute respiratory failure with hypercapnia (HCC) 12/14/2022   Delirium due to multiple etiologies, acute, hyperactive 12/14/2022   Acute respiratory failure with hypoxia and hypercapnia (HCC) 10/25/2022   Acute on chronic diastolic CHF (congestive heart failure) (HCC) 10/23/2022   Current mild episode of major depressive disorder without prior episode 06/06/2022   Fever 06/04/2022   Acute on chronic diastolic (congestive) heart failure (HCC) 05/16/2022   Acute kidney injury superimposed on chronic kidney disease 02/01/2022   Hypothyroidism  02/01/2022   Chronic pain disorder 02/01/2022   Respiratory failure with hypercapnia (HCC) 12/23/2020   Anemia 12/23/2020   Diabetic peripheral neuropathy (HCC) 08/18/2020   Acute on chronic respiratory failure with hypoxia (HCC) 04/18/2020   Hemoptysis 04/18/2020   Encephalopathy 04/18/2020   Chronic diastolic CHF (congestive heart failure) (HCC) 04/18/2020   Sepsis (HCC) 04/18/2020   Severe obesity (BMI >= 40) (HCC)    Fall at home, initial encounter 06/23/2018   HNP (herniated nucleus pulposus), thoracic 06/23/2018   Essential hypertension 08/28/2017   Obesity, class 3 (HCC) 08/28/2017   COPD exacerbation (HCC) 08/04/2017   Acute and chronic respiratory failure with hypercapnia (HCC)    Pulmonary HTN (HCC)    Encounter for geriatric assessment 02/27/2017   Family history of thyroid  cancer    Hypomagnesemia 10/03/2016   Atypical chest pain    HLD (hyperlipidemia) 09/23/2016   COPD (chronic obstructive pulmonary disease) (HCC) 09/23/2016   Gout 09/23/2016   DVT (deep vein thrombosis) in pregnancy 09/23/2016   Thyroid  cancer (HCC) 09/23/2016   Hypocalcemia 09/23/2016   Acute pulmonary edema (HCC)    Acute hypoxemic respiratory failure (HCC)    Chronic hypoxic respiratory failure (HCC) 07/16/2016   CAP (community  acquired pneumonia) 01/23/2016   Multinodular goiter w/ dominant right thyroid  nodule 01/23/2016   Pulmonary hypertension (HCC) 01/23/2016   Obesity hypoventilation syndrome (HCC) 08/26/2015   Chest pain 06/16/2015   Non-insulin  dependent type 2 diabetes mellitus (HCC) 04/24/2015   Leukocytosis 04/24/2015   Type 2 diabetes mellitus with hyperlipidemia (HCC) 04/24/2015   Depression with anxiety 04/24/2015   Benign essential HTN 04/24/2015   Normocytic anemia 04/24/2015   Anxiety 04/24/2015   OSA (obstructive sleep apnea) 05/10/2012   Tobacco abuse 07/04/2011   PCP:  [name], MD Pharmacy:   [facility] - [place], [place] - [address] [place] [address] Phone: [phone] Fax: [phone]     Social Drivers of Health (SDOH) Social History: SDOH Screenings   Food Insecurity: No Food Insecurity (01/22/2024)  Housing: Unknown (01/22/2024)  Transportation Needs: No Transportation Needs (01/22/2024)  Utilities: Not At Risk (01/22/2024)  Alcohol Screen: Low Risk  (10/24/2022)  Depression (PHQ2-9): Medium Risk (12/25/2023)  Financial Resource Strain: Low Risk  (10/24/2022)  Physical Activity: Inactive (02/01/2021)  Social Connections: Moderately Isolated (01/22/2024)  Stress: Stress Concern Present (02/23/2021)  Tobacco Use: Medium Risk (01/21/2024)   SDOH Interventions:     Readmission Risk Interventions    01/23/2024   12:36 PM 07/09/2022   10:29 AM 05/18/2022   11:29 AM  Readmission Risk Prevention Plan  Transportation Screening Complete Complete Complete  PCP or Specialist Appt within 3-5 Days  Complete   HRI or Home Care Consult  Complete Complete  Social Work Consult for Recovery Care Planning/Counseling  Complete Complete  Palliative Care Screening  Not Applicable Complete  Medication Review Oceanographer) Complete Not Complete Referral to Pharmacy  PCP or Specialist appointment within 3-5 days of discharge Complete    HRI or Home Care Consult Complete    Palliative Care Screening Not Applicable    Skilled Nursing Facility Not Applicable

## 2024-01-23 NOTE — Assessment & Plan Note (Signed)
 Continue with levothyroxine

## 2024-01-23 NOTE — Plan of Care (Signed)
 [id number]- CCMD notified that patient had episode of junctional escape rhythm, seen patient asleep but rousable, rechecked vital signs. Patient is asymptomatic, no active complaints. Monitor accordingly.   Patient refused due dose of Keplex  and Potassium tablet at 2200H, stated  After taking that medicine in the afternoon I had shortness of breath and urinary incontinence.  Patient education about medication indication provided but patient became more irritable and non agreeable. Notified Dr. Segars about the events and  pt medicines refusal.  Continue to provide care per plan. Safety precautions in place.    Problem: Nutritional: Goal: Maintenance of adequate nutrition will improve Outcome: Progressing   Problem: Tissue Perfusion: Goal: Adequacy of tissue perfusion will improve Outcome: Progressing   Problem: Clinical Measurements: Goal: Will remain free from infection Outcome: Progressing   Problem: Elimination: Goal: Will not experience complications related to urinary retention Outcome: Progressing   Problem: Safety: Goal: Ability to remain free from injury will improve Outcome: Progressing   Problem: Respiratory: Goal: Ability to maintain a clear airway will improve Outcome: Progressing Goal: Levels of oxygenation will improve Outcome: Progressing Goal: Ability to maintain adequate ventilation will improve Outcome: Progressing

## 2024-01-23 NOTE — Discharge Summary (Signed)
 Physician Discharge Summary   Patient: Michele Owens MRN: [id number] DOB: [birth year]  Admit date:     01/21/2024  Discharge date: 01/23/24  Discharge Physician: Elidia Sieving Maurianna Benard   PCP: [name], MD   Recommendations at discharge:    Patient has been advised to stay adherent to her medical therapy. Plan to resume torsemide  40 mg po bid for diuresis.  Heart failure guideline medical therapy with carvedilol , losartan  and added spironolactone .  Holding SGLT 2 inh due to risk of urine infections.  Follow up renal function and electrolytes in 7 days as outpatient Follow up with Dr Sebastian in 7 to 10 days Follow up with Cardiology as scheduled.   Discharge Diagnoses: Principal Problem:   Acute on chronic diastolic (congestive) heart failure (HCC) Active Problems:   Essential hypertension   COPD (chronic obstructive pulmonary disease) (HCC)   Type 2 diabetes mellitus with hyperlipidemia (HCC)   Bipolar disorder (HCC)   OSA (obstructive sleep apnea)   Hypothyroidism   Obesity, class 3 (HCC)  Resolved Problems:   * No resolved hospital problems. [facility] Course: Mrs. Larimer was admitted to the hospital with the working diagnosis of heart failure exacerbation.   63 yo female with the past medical history of heart failure, T2DM, hypertension, hyperlipidemia, hypothyroidism, COPD, bipolar and obesity who presented with dyspnea.  Frequent ED visits, so far 3 prior to this admission on this month. ED visit 10/12, for cystitis, she was placed on antibiotic therapy and discharged home.  At home she stopped taking diuretic due to incontinence. Because of worsening symptoms she came back to the ED.  On her initial physical examination she was found hypoxemic with increased 02 requirements to 6 L per from 4 L min, she had increased respiratory rate and increased work of breathing. Blood pressure 138/54, HR 57, RR 19 and 02 saturation 97% on 6 L/min per [place] Lungs with  decreased breath sounds, heart with S1 and S2 present and regular, abdomen protuberant, but not distended, positive lower extremity edema.   VBG 7,46/ 58/ 62/ 44/ 92%  Na 138, K 4.0 Cl 93 bicarbonate 34 glucose 140 bun 9 cr 0,86  AST 27 ALT 24  BNP 61.6  High sensitive troponin 6  Wbc 15.4 hgb 11.1 plt 339   Influenza negative RSV negative Covid 19 negative  Chest radiograph with hypoinflation, under-penetrated due to body habitus, positive cardiomegaly, bilateral hilar vascular congestion.   EKG 75 bpm, normal axis, normal intervals, qtc 499, sinus rhythm with left atrial enlargement, no significant ST segment changes, negative T wave V1 and V2.   Patient was placed on IV furosemide  for diuresis.  Echocardiogram with preserved LV systolic function.  10/15 improved volume status, plan to follow up as outpatient, resume her home dose of torsemide .    Assessment and Plan: * Acute on chronic diastolic (congestive) heart failure (HCC) Echocardiogram (technical difficult) preserved LV systolic function with EF 55 to 60%, no regional wall motion abnormalities, interventricular septum flattened in systole and diastole, RV systolic function preserved, RV size with moderate enlargement, no significant valvular disease.   Acute on chronic cor pulmonale Pulmonary hypertension RV dysfunction.   Patient was place on IV furosemide , negative fluid balance was achieved with improvement in her symptoms.   Plan to continue carvedilol , losartan  and will add spironolactone .  Diuresis with torsemide , her home dose if 40 mg po bid.  No SGLT 2 inh due to risk of urinary tract infections.  Follow up as  outpatient.   Acute on chronic hypoxemic respiratory failure due to acute cardiogenic pulmonary edema. At the time of discharge her oxygenation has improved.  02 saturation 93% on 3 L/min per [place].   Essential hypertension Continue blood pressure control with losartan  and carvedilol .   COPD (chronic  obstructive pulmonary disease) (HCC) Asthma.  Continue bronchodilator therapy.  Inhaled corticosteroids.  Supplemental 02 per [place].   Type 2 diabetes mellitus with hyperlipidemia (HCC) Patient was placed on insulin  sliding scale for glucose cover and monitoring.  At the time of discharge her glucose has remained well controlled.   Continue with fenofibrate  and statin   Bipolar disorder (HCC) Continue with lurasidone  and buspirone  Follow up as outpatient.   OSA (obstructive sleep apnea) Continue with Cpap as outpatient.  Supplemental 02 per [place]   Hypothyroidism Continue with levothyroxine .   Obesity, class 3 (HCC) Calculated BMI is 54.3          Consultants: cardiology  Procedures performed: none   Disposition: Home Diet recommendation:  Cardiac and Carb modified diet DISCHARGE MEDICATION: Allergies as of 01/23/2024       Reactions   Bee Venom Anaphylaxis   All over my body (swelling)   Fioricet  [butalbital -apap-caffeine ] Nausea And Vomiting, Rash   Ibuprofen  Rash, Other (See Comments)   Severe rash   Lamisil  [terbinafine ] Rash, Other (See Comments)   Pt states this causes her to feel funny   Nsaids Other (See Comments)   Per MD's orders         Medication List     STOP taking these medications    cephALEXin  500 MG capsule Commonly known as: KEFLEX    doxepin  10 MG capsule Commonly known as: SINEQUAN    Farxiga  10 MG Tabs tablet Generic drug: dapagliflozin  propanediol   potassium chloride  10 MEQ tablet Commonly known as: KLOR-CON        TAKE these medications    Accu-Chek Guide Test test strip Generic drug: glucose blood Use to check blood sugar 3 times daily   Accu-Chek Softclix Lancets lancets Use to check blood sugar 3 times daily   acetaminophen  325 MG tablet Commonly known as: TYLENOL  Take 2 tablets (650 mg total) by mouth every 6 (six) hours as needed for mild pain (pain score 1-3) or fever (or Fever >/= 101). What changed:   medication strength how much to take when to take this reasons to take this   albuterol  108 (90 Base) MCG/ACT inhaler Commonly known as: VENTOLIN  HFA Inhale 1-2 puffs into the lungs every 6 (six) hours as needed for wheezing or shortness of breath.   allopurinol  100 MG tablet Commonly known as: ZYLOPRIM  Take 1 tablet (100 mg total) by mouth daily.   aspirin  EC 81 MG tablet Take 1 tablet (81 mg total) by mouth daily. Swallow whole.   blood glucose meter kit and supplies Kit Dispense based on patient and insurance preference. Use up to four times daily as directed.   Blood Glucose Monitoring Suppl Devi 1 each by Does not apply route in the morning, at noon, and at bedtime. May substitute to any manufacturer covered by patient's insurance.   budesonide  0.25 MG/2ML nebulizer solution Commonly known as: PULMICORT  Inhale 2 mLs (0.25 mg total) by nebulization 2 (two) times daily.   busPIRone  30 MG tablet Commonly known as: BUSPAR  Take 1 tablet (30 mg total) by mouth 2 (two) times daily.   carvedilol  6.25 MG tablet Commonly known as: COREG  Take 1 tablet (6.25 mg total) by mouth 2 (two) times  daily.   clotrimazole  1 % cream Commonly known as: LOTRIMIN  Apply topically 2 (two) times daily.   diclofenac  Sodium 1 % Gel Commonly known as: VOLTAREN  Apply 4 g topically 4 (four) times daily.   dicyclomine  20 MG tablet Commonly known as: BENTYL  Take 1 tablet (20 mg total) by mouth 2 (two) times daily.   EPINEPHrine  0.3 mg/0.3 mL Soaj injection Commonly known as: EPI-PEN Inject 0.3 mg into the muscle once as needed for anaphylaxis.   fenofibrate  145 MG tablet Commonly known as: TRICOR  Take 1 tablet (145 mg total) by mouth daily.   FeroSul 325 (65 Fe) MG tablet Generic drug: ferrous sulfate  Take 1 tablet (325 mg total) by mouth daily.   gabapentin 100 MG capsule Commonly known as: NEURONTIN Take 1 capsule 3 times a day by oral route as needed for 30 days.    ipratropium-albuterol  0.5-2.5 (3) MG/3ML Soln Commonly known as: DUONEB Take 3 mLs by nebulization every 4 (four) hours as needed.   levocetirizine 5 MG tablet Commonly known as: XYZAL  TAKE 1 TABLET (5 MG TOTAL) BY MOUTH AT BEDTIME.   levothyroxine  125 MCG tablet Commonly known as: SYNTHROID  Take 1 tablet (125 mcg total) by mouth at bedtime.   losartan  25 MG tablet Commonly known as: COZAAR  Take 1 tablet (25 mg total) by mouth daily.   lurasidone  20 MG Tabs tablet Commonly known as: LATUDA  Take 1 tablet (20 mg total) by mouth at bedtime. Take with a snack. This is for mood and sleep.   nitroGLYCERIN  0.4 MG SL tablet Commonly known as: NITROSTAT  Place 1 tablet (0.4 mg total) under the tongue every 5 (five) minutes as needed for chest pain.   Nurtec 75 MG Tbdp Generic drug: Rimegepant Sulfate Take 1 tablet (75 mg total) by mouth every other day. Prevents and Treats Migraine Headaches   omeprazole  20 MG capsule Commonly known as: PRILOSEC Take 2 capsules (40 mg total) by mouth daily.   ondansetron  4 MG disintegrating tablet Commonly known as: ZOFRAN -ODT Take 1 tablet (4 mg total) by mouth every 8 (eight) hours as needed for nausea or vomiting.   OXYGEN  Inhale 2 L into the lungs continuous.   phenazopyridine 200 MG tablet Commonly known as: PYRIDIUM Take 1 tablet (200 mg total) by mouth 3 (three) times daily.   rizatriptan 10 MG tablet Commonly known as: Maxalt Take 1 tablet (10 mg total) by mouth as needed for migraine. May repeat in 2 hours if needed   rosuvastatin  10 MG tablet Commonly known as: CRESTOR  Take 1 tablet (10 mg total) by mouth every evening.   spironolactone  25 MG tablet Commonly known as: ALDACTONE  Take 0.5 tablets (12.5 mg total) by mouth daily.   torsemide  20 MG tablet Commonly known as: DEMADEX  Take 2 tablets (40 mg total) by mouth 2 (two) times daily.   umeclidinium-vilanterol 62.5-25 MCG/ACT Aepb Commonly known as: ANORO ELLIPTA  Inhale  1 puff into the lungs daily.   Vitamin D  (Ergocalciferol ) 1.25 MG (50000 UNIT) Caps capsule Commonly known as: DRISDOL  Take 1 capsule (50,000 Units total) by mouth every 7 (seven) days.        Discharge Exam: Filed Weights   01/21/24 1320 01/22/24 0429  Weight: (!) 142.4 kg (!) 148.1 kg   BP 123/73 (BP Location: Left Wrist)   Pulse 77   Temp 98.5 F (36.9 C) (Oral)   Resp 15   Ht 5' 5 (1.651 m)   Wt (!) 148.1 kg   SpO2 93%   BMI 54.33  kg/m   Patient is feeling better, has no chest pain, no dyspnea, no PND, orthopnea or lower extremity edema  Neurology awake and alert ENT with mild pallor Cardiovascular with S1 and S2 present and regular with no gallops, rubs or murmurs No JVD Respiratory with no rales or wheezing, no rhonchi  Abdomen protuberant, not distended and not tender No lower extremity edema   Condition at discharge: stable  The results of significant diagnostics from this hospitalization (including imaging, microbiology, ancillary and laboratory) are listed below for reference.   Imaging Studies: ECHOCARDIOGRAM COMPLETE Result Date: 01/22/2024    ECHOCARDIOGRAM REPORT   Patient Name:   [facility] Date of Exam: 01/22/2024 Medical Rec #:  [id number]         Height:       65.0 in Accession #:    [id number]        Weight:       326.5 lb Date of Birth:  1960-10-17        BSA:          2.436 m Patient Age:    62 years          BP:           108/62 mmHg Patient Gender: F                 HR:           54 bpm. Exam Location:  Inpatient Procedure: 2D Echo, Cardiac Doppler, Color Doppler and Intracardiac            Opacification Agent (Both Spectral and Color Flow Doppler were            utilized during procedure). Indications:    CHF  History:        Patient has prior history of Echocardiogram examinations, most                 recent 05/15/2022. CHF, COPD, Signs/Symptoms:Chest Pain; Risk                 Factors:Diabetes, Hypertension and Former Smoker.  Sonographer:     Juliene Rucks Referring Phys: JON GARRE DUKE  Sonographer Comments: Technically challenging study due to limited acoustic windows and patient is obese. Image acquisition challenging due to COPD and Image acquisition challenging due to patient body habitus. IMPRESSIONS  1. Left ventricular ejection fraction, by estimation, is 55 to 60%. The left ventricle has normal function. The left ventricle has no regional wall motion abnormalities. Left ventricular diastolic parameters are indeterminate. There is the interventricular septum is flattened in systole and diastole, consistent with right ventricular pressure and volume overload.  2. Right ventricular systolic function is normal. The right ventricular size is moderately enlarged. Tricuspid regurgitation signal is inadequate for assessing PA pressure.  3. The mitral valve is grossly normal. No evidence of mitral valve regurgitation. No evidence of mitral stenosis.  4. The aortic valve is grossly normal. Aortic valve regurgitation is not visualized. No aortic stenosis is present.  5. The inferior vena cava is normal in size with greater than 50% respiratory variability, suggesting right atrial pressure of 3 mmHg. Comparison(s): No significant change from prior study. FINDINGS  Left Ventricle: Left ventricular ejection fraction, by estimation, is 55 to 60%. The left ventricle has normal function. The left ventricle has no regional wall motion abnormalities. Definity  contrast agent was given IV to delineate the left ventricular  endocardial borders. The left ventricular internal cavity size was normal in  size. There is no left ventricular hypertrophy. The interventricular septum is flattened in systole and diastole, consistent with right ventricular pressure and volume overload. Left ventricular diastolic parameters are indeterminate. Right Ventricle: The right ventricular size is moderately enlarged. No increase in right ventricular wall thickness. Right ventricular  systolic function is normal. Tricuspid regurgitation signal is inadequate for assessing PA pressure. Left Atrium: Left atrial size was not well visualized. Right Atrium: Right atrial size was not well visualized. Pericardium: There is no evidence of pericardial effusion. Mitral Valve: The mitral valve is grossly normal. No evidence of mitral valve regurgitation. No evidence of mitral valve stenosis. Tricuspid Valve: The tricuspid valve is grossly normal. Tricuspid valve regurgitation is trivial. No evidence of tricuspid stenosis. Aortic Valve: The aortic valve is grossly normal. Aortic valve regurgitation is not visualized. No aortic stenosis is present. Pulmonic Valve: The pulmonic valve was grossly normal. Pulmonic valve regurgitation is not visualized. No evidence of pulmonic stenosis. Aorta: The aortic root and ascending aorta are structurally normal, with no evidence of dilitation. Venous: The inferior vena cava is normal in size with greater than 50% respiratory variability, suggesting right atrial pressure of 3 mmHg. IAS/Shunts: The interatrial septum was not well visualized.  LEFT VENTRICLE PLAX 2D LVIDd:         4.90 cm   Diastology LVIDs:         3.40 cm   LV e' medial:    9.68 cm/s LV PW:         1.00 cm   LV E/e' medial:  10.8 LV IVS:        1.00 cm   LV e' lateral:   5.98 cm/s LVOT diam:     2.40 cm   LV E/e' lateral: 17.6 LV SV:         88 LV SV Index:   36 LVOT Area:     4.52 cm LV IVRT:       53 msec  RIGHT VENTRICLE RV S prime:     10.60 cm/s TAPSE (M-mode): 2.9 cm LEFT ATRIUM         Index LA diam:    3.60 cm 1.48 cm/m  AORTIC VALVE LVOT Vmax:   82.75 cm/s LVOT Vmean:  53.350 cm/s LVOT VTI:    0.194 m  AORTA Ao Root diam: 3.20 cm Ao Asc diam:  3.50 cm MITRAL VALVE MV Area (PHT): 3.60 cm     SHUNTS MV Decel Time: 211 msec     Systemic VTI:  0.19 m MV E velocity: 105.00 cm/s  Systemic Diam: 2.40 cm MV A velocity: 87.30 cm/s MV E/A ratio:  1.20 Darryle Decent MD Electronically signed by Darryle Decent MD Signature Date/Time: 01/22/2024/3:42:31 PM    Final    DG Chest 2 View Result Date: 01/21/2024 CLINICAL DATA:  Worsening shortness of breath. EXAM: CHEST - 2 VIEW COMPARISON:  January 20, 2024 FINDINGS: The cardiac silhouette is enlarged and unchanged in size. There is marked severity calcification of the aortic arch. There is mild, stable prominence of the central pulmonary vasculature. Mild linear scarring and/or atelectasis is seen within the mid right lung and mid to lower left lung. No pleural effusion or pneumothorax is identified. Multilevel degenerative changes are seen throughout the thoracic spine. IMPRESSION: 1. Cardiomegaly with mild prominence of the central pulmonary vasculature. 2. Mild mid right lung and mid to lower left lung linear scarring and/or atelectasis. Electronically Signed   By: Suzen Dials M.D.   On: 01/21/2024  14:34   DG Chest Portable 1 View Result Date: 01/20/2024 CLINICAL DATA:  Shortness of breath EXAM: PORTABLE CHEST 1 VIEW COMPARISON:  01/15/2024 FINDINGS: Cardiac shadow is prominent similar to that seen on the prior exam. Overlying soft tissue density is noted. The lungs are clear bilaterally. No bony abnormality is seen. 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 No nodular lesions are not well appreciated on this exam. IMPRESSION: Prominent overlying soft tissue.  No focal abnormality is noted. Electronically Signed   By: Oneil Devonshire M.D.   On: 01/20/2024 21:17   CT ABDOMEN PELVIS W CONTRAST Result Date: 01/16/2024 CLINICAL DATA:  Chest pain and lower abdominal pain, initial encounter EXAM: CT ANGIOGRAPHY CHEST CT ABDOMEN AND PELVIS WITH CONTRAST TECHNIQUE: Multidetector CT imaging of the chest was performed using the standard protocol during bolus administration of intravenous contrast. Multiplanar CT image reconstructions and MIPs were obtained to evaluate the vascular anatomy. Multidetector CT imaging of the abdomen and pelvis was performed using  the standard protocol during bolus administration of intravenous contrast. RADIATION DOSE REDUCTION: This exam was performed according to the departmental dose-optimization program which includes automated exposure control, adjustment of the mA and/or kV according to patient size and/or use of iterative reconstruction technique. CONTRAST:  OMNIPAQUE  IOHEXOL  350 MG/ML SOLN COMPARISON:  Chest x-ray from the previous day, CT from 12/13/2022. FINDINGS: CTA CHEST FINDINGS Cardiovascular: Atherosclerotic calcifications of the thoracic aorta are noted. No aneurysmal dilatation or dissection is noted. Heart is mildly enlarged in size. The pulmonary artery shows a normal branching pattern bilaterally. No filling defect to suggest pulmonary embolism is noted. Mediastinum/Nodes: Thoracic inlet is within normal limits. No hilar or mediastinal adenopathy is noted. Esophagus as visualized is within limits. Lungs/Pleura: Mild basilar atelectasis is noted. No focal infiltrate or sizable effusion is seen. There is an 8 mm nodule identified within the right middle lobe. This previously measured 3 mm in retrospect on the prior exam. Additionally a 9 mm nodule is seen in the medial aspect of the right lower lobe which previously measured 5 mm some patchy nodular change in the left lower lobe is noted as well best seen on image number 77 of series 12. This measures approximately 9 mm in diameter as well. Musculoskeletal: Degenerative changes of the thoracic spine are noted. No acute rib abnormality is seen. Review of the MIP images confirms the above findings. CT ABDOMEN and PELVIS FINDINGS Hepatobiliary: No focal liver abnormality is seen. No gallstones, gallbladder wall thickening, or biliary dilatation. Pancreas: Unremarkable. No pancreatic ductal dilatation or surrounding inflammatory changes. Spleen: Normal in size without focal abnormality. Adrenals/Urinary Tract: Adrenal glands are within normal limits. Kidneys demonstrate  a normal enhancement pattern bilaterally. No renal calculi are seen. No obstructive changes are noted. The bladder is well distended. Stomach/Bowel: No obstructive changes of the colon are seen. No inflammatory changes noted. The appendix is air-filled and within normal limits. Small bowel and stomach are within normal limits. Vascular/Lymphatic: No significant vascular findings are present. No enlarged abdominal or pelvic lymph nodes. Reproductive: Status post hysterectomy. No adnexal masses. Other: No abdominal wall hernia or abnormality. No abdominopelvic ascites. Musculoskeletal: Degenerative changes of lumbar spine are noted. Review of the MIP images confirms the above findings. IMPRESSION: CTA of the chest: No evidence of pulmonary emboli. Multiple right-sided nodules which have increased in size when compared with the prior exam. These are suspicious given their growth from the prior exam. A new left lower lobe nodule is seen measuring up to 9  mm. Non-contrast chest CT at 3 months is recommended. If the nodules are stable at time of repeat CT, then future CT at 18-24 months (from today's scan) is considered optional for low-risk patients, but is recommended for high-risk patients. This recommendation follows the consensus statement: Guidelines for Management of Incidental Pulmonary Nodules Detected on CT Images: From the Fleischner Society 2017; Radiology 2017; 284:228-243. Pulmonary referral may be helpful as well. CT of the abdomen and pelvis: No acute abnormality is seen. Electronically Signed   By: Oneil Devonshire M.D.   On: 01/16/2024 21:55   CT Angio Chest PE W and/or Wo Contrast Result Date: 01/16/2024 CLINICAL DATA:  Chest pain and lower abdominal pain, initial encounter EXAM: CT ANGIOGRAPHY CHEST CT ABDOMEN AND PELVIS WITH CONTRAST TECHNIQUE: Multidetector CT imaging of the chest was performed using the standard protocol during bolus administration of intravenous contrast. Multiplanar CT image  reconstructions and MIPs were obtained to evaluate the vascular anatomy. Multidetector CT imaging of the abdomen and pelvis was performed using the standard protocol during bolus administration of intravenous contrast. RADIATION DOSE REDUCTION: This exam was performed according to the departmental dose-optimization program which includes automated exposure control, adjustment of the mA and/or kV according to patient size and/or use of iterative reconstruction technique. CONTRAST:  OMNIPAQUE  IOHEXOL  350 MG/ML SOLN COMPARISON:  Chest x-ray from the previous day, CT from 12/13/2022. FINDINGS: CTA CHEST FINDINGS Cardiovascular: Atherosclerotic calcifications of the thoracic aorta are noted. No aneurysmal dilatation or dissection is noted. Heart is mildly enlarged in size. The pulmonary artery shows a normal branching pattern bilaterally. No filling defect to suggest pulmonary embolism is noted. Mediastinum/Nodes: Thoracic inlet is within normal limits. No hilar or mediastinal adenopathy is noted. Esophagus as visualized is within limits. Lungs/Pleura: Mild basilar atelectasis is noted. No focal infiltrate or sizable effusion is seen. There is an 8 mm nodule identified within the right middle lobe. This previously measured 3 mm in retrospect on the prior exam. Additionally a 9 mm nodule is seen in the medial aspect of the right lower lobe which previously measured 5 mm some patchy nodular change in the left lower lobe is noted as well best seen on image number 77 of series 12. This measures approximately 9 mm in diameter as well. Musculoskeletal: Degenerative changes of the thoracic spine are noted. No acute rib abnormality is seen. Review of the MIP images confirms the above findings. CT ABDOMEN and PELVIS FINDINGS Hepatobiliary: No focal liver abnormality is seen. No gallstones, gallbladder wall thickening, or biliary dilatation. Pancreas: Unremarkable. No pancreatic ductal dilatation or surrounding inflammatory  changes. Spleen: Normal in size without focal abnormality. Adrenals/Urinary Tract: Adrenal glands are within normal limits. Kidneys demonstrate a normal enhancement pattern bilaterally. No renal calculi are seen. No obstructive changes are noted. The bladder is well distended. Stomach/Bowel: No obstructive changes of the colon are seen. No inflammatory changes noted. The appendix is air-filled and within normal limits. Small bowel and stomach are within normal limits. Vascular/Lymphatic: No significant vascular findings are present. No enlarged abdominal or pelvic lymph nodes. Reproductive: Status post hysterectomy. No adnexal masses. Other: No abdominal wall hernia or abnormality. No abdominopelvic ascites. Musculoskeletal: Degenerative changes of lumbar spine are noted. Review of the MIP images confirms the above findings. IMPRESSION: CTA of the chest: No evidence of pulmonary emboli. Multiple right-sided nodules which have increased in size when compared with the prior exam. These are suspicious given their growth from the prior exam. A new left lower lobe nodule is  seen measuring up to 9 mm. Non-contrast chest CT at 3 months is recommended. If the nodules are stable at time of repeat CT, then future CT at 18-24 months (from today's scan) is considered optional for low-risk patients, but is recommended for high-risk patients. This recommendation follows the consensus statement: Guidelines for Management of Incidental Pulmonary Nodules Detected on CT Images: From the Fleischner Society 2017; Radiology 2017; 284:228-243. Pulmonary referral may be helpful as well. CT of the abdomen and pelvis: No acute abnormality is seen. Electronically Signed   By: Oneil Devonshire M.D.   On: 01/16/2024 21:55   DG Chest 2 View Result Date: 01/15/2024 CLINICAL DATA:  cp shortness of breath, wears 2L [place] at home, currently on 4L [place] and she also reports chest pain EXAM: CHEST - 2 VIEW COMPARISON:  Chest x-ray 11/29/2023 FINDINGS:  Patient is rotated. The heart and mediastinal contours are unchanged. No focal consolidation. No pulmonary edema. No pleural effusion. No pneumothorax. No acute osseous abnormality. IMPRESSION: No active cardiopulmonary disease. Electronically Signed   By: Morgane  Naveau M.D.   On: 01/15/2024 01:13   CT Head Wo Contrast Result Date: 12/29/2023 CLINICAL DATA:  sudden onset headache EXAM: CT HEAD WITHOUT CONTRAST TECHNIQUE: Contiguous axial images were obtained from the base of the skull through the vertex without intravenous contrast. RADIATION DOSE REDUCTION: This exam was performed according to the departmental dose-optimization program which includes automated exposure control, adjustment of the mA and/or kV according to patient size and/or use of iterative reconstruction technique. COMPARISON:  None Available. FINDINGS: Brain: No evidence of acute infarction, hemorrhage, hydrocephalus, extra-axial collection or mass lesion/mass effect. Vascular: No hyperdense vessel or unexpected calcification. Skull: Normal. Negative for fracture or focal lesion. Sinuses/Orbits: No acute finding. IMPRESSION: No evidence of acute intracranial abnormality. Electronically Signed   By: Gilmore GORMAN Molt M.D.   On: 12/29/2023 16:23    Microbiology: Results for orders placed or performed during the hospital encounter of 01/21/24  Resp panel by RT-PCR (RSV, Flu A&B, Covid) Anterior Nasal Swab     Status: None   Collection Time: 01/21/24  2:14 PM   Specimen: Anterior Nasal Swab  Result Value Ref Range Status   SARS Coronavirus 2 by RT PCR NEGATIVE NEGATIVE Final   Influenza A by PCR NEGATIVE NEGATIVE Final   Influenza B by PCR NEGATIVE NEGATIVE Final    Comment: (NOTE) The Xpert Xpress SARS-CoV-2/FLU/RSV plus assay is intended as an aid in the diagnosis of influenza from Nasopharyngeal swab specimens and should not be used as a sole basis for treatment. Nasal washings and aspirates are unacceptable for Xpert Xpress  SARS-CoV-2/FLU/RSV testing.  Fact Sheet for Patients: BloggerCourse.com  Fact Sheet for Healthcare Providers: SeriousBroker.it  This test is not yet approved or cleared by the [place]  FDA and has been authorized for detection and/or diagnosis of SARS-CoV-2 by FDA under an Emergency Use Authorization (EUA). This EUA will remain in effect (meaning this test can be used) for the duration of the COVID-19 declaration under Section 564(b)(1) of the Act, 21 U.S.C. section 360bbb-3(b)(1), unless the authorization is terminated or revoked.     Resp Syncytial Virus by PCR NEGATIVE NEGATIVE Final    Comment: (NOTE) Fact Sheet for Patients: BloggerCourse.com  Fact Sheet for Healthcare Providers: SeriousBroker.it  This test is not yet approved or cleared by the [place]  FDA and has been authorized for detection and/or diagnosis of SARS-CoV-2 by FDA under an Emergency Use Authorization (EUA). This EUA will remain in effect (meaning  this test can be used) for the duration of the COVID-19 declaration under Section 564(b)(1) of the Act, 21 U.S.C. section 360bbb-3(b)(1), unless the authorization is terminated or revoked.  Performed at [facility], [address] [place], [place] [address]    *Note: Due to a large number of results and/or encounters for the requested time period, some results have not been displayed. A complete set of results can be found in Results Review.    Labs: CBC: Recent Labs  Lab 01/16/24 1406 01/20/24 2050 01/21/24 1322 01/21/24 1339 01/22/24 0140 01/23/24 0612  WBC 14.2* 14.6* 15.4*  --  15.4* 18.8*  NEUTROABS 11.3* 11.8* 12.4*  --   --   --   HGB 11.2* 10.6* 11.1* 12.2 12.0 11.6*  HCT 38.1 35.8* 36.2 36.0 38.6 37.6  MCV 98.7 101.4* 98.4  --  97.0 97.4  PLT 291 333 339  --  385 366   Basic Metabolic Panel: Recent Labs  Lab  01/20/24 2050 01/21/24 1322 01/21/24 1339 01/21/24 1729 01/22/24 0140 01/22/24 1519 01/23/24 0612  NA 141 138 139  --  140 139 139  K 4.5 4.0 4.4  --  4.0 4.5 3.7  CL 98 93*  --   --  94* 95* 91*  CO2 34* 34*  --   --  33* 31 37*  GLUCOSE 123* 140*  --   --  193* 144* 113*  BUN 8 9  --   --  8 13 16   CREATININE 0.78 0.86  --   --  0.83 1.10* 0.99  CALCIUM  8.5* 8.5*  --   --  8.6* 8.9 8.6*  MG  --   --   --  1.8 2.4 2.6*  --    Liver Function Tests: Recent Labs  Lab 01/16/24 1406 01/20/24 2050 01/21/24 1322  AST 21 24 27   ALT 12 26 24   ALKPHOS 74 60 64  BILITOT 0.4 0.6 0.9  PROT 6.9 6.5 6.7  ALBUMIN  4.2 3.4* 3.6   CBG: Recent Labs  Lab 01/22/24 1201 01/22/24 1538 01/22/24 2137 01/22/24 2221 01/23/24 0607  GLUCAP 173* 132* 178* 179* 116*    Discharge time spent: greater than 30 minutes.  Signed: Elidia Toribio Furnace, MD [facility] 01/23/2024

## 2024-01-24 ENCOUNTER — Telehealth: Payer: Self-pay

## 2024-01-24 NOTE — Transitions of Care (Post Inpatient/ED Visit) (Signed)
   01/24/2024  Name: Michele Owens MRN: [id number] DOB: 1961-02-15  Today's TOC FU Call Status: Today's TOC FU Call Status:: Unsuccessful Call (1st Attempt) Unsuccessful Call (1st Attempt) Date: 01/24/24  Attempted to reach the patient regarding the most recent Inpatient/ED visit. Left a HIPAA approved voicemail message to phone number provided in demographics per DPR.    Follow Up Plan: Additional outreach attempts will be made to reach the patient to complete the Transitions of Care (Post Inpatient/ED visit) call.   Richerd Fish, RN, BSN, CCM [facility], [facility] RN Care Manager Direct Dial: [phone]

## 2024-01-28 ENCOUNTER — Inpatient Hospital Stay: Admitting: Internal Medicine

## 2024-01-28 ENCOUNTER — Telehealth: Payer: Self-pay

## 2024-01-28 NOTE — Transitions of Care (Post Inpatient/ED Visit) (Signed)
   01/28/2024  Name: Michele Owens MRN: [id number] DOB: August 07, 1960  Today's TOC FU Call Status: Today's TOC FU Call Status:: Unsuccessful Call (2nd Attempt) Unsuccessful Call (2nd Attempt) Date: 01/28/24  Attempted to reach the patient regarding the most recent Inpatient/ED visit.  Left a HIPAA approved voicemail message to phone number provided in demographics per DPR.    Follow Up Plan: Additional outreach attempts will be made to reach the patient to complete the Transitions of Care (Post Inpatient/ED visit) call.   Richerd Fish, RN, BSN, CCM [facility], [facility] RN Care Manager Direct Dial: [phone]

## 2024-01-28 NOTE — Progress Notes (Deleted)
 [facility]IMARY CARE [facility] [address] [place] [address] Dept: [phone] Dept Fax: [phone]  Office Visit  Subjective:   Michele Owens [facility] 01-06-1961 01/28/2024  No chief complaint on file.   HPI:   Wt Readings from Last 3 Encounters:  01/22/24 (!) 326 lb 8 oz (148.1 kg)  01/20/24 (!) 305 lb (138.3 kg)  01/14/24 (!) 314 lb (142.4 kg)   BP Readings from Last 3 Encounters:  01/23/24 (!) 101/51  01/20/24 129/64  01/17/24 139/67   Last metabolic panel Lab Results  Component Value Date   GLUCOSE 113 (H) 01/23/2024   NA 139 01/23/2024   K 3.7 01/23/2024   CL 91 (L) 01/23/2024   CO2 37 (H) 01/23/2024   BUN 16 01/23/2024   CREATININE 0.99 01/23/2024   GFRNONAA >60 01/23/2024   CALCIUM  8.6 (L) 01/23/2024   PHOS 3.6 10/28/2022   PROT 6.7 01/21/2024   ALBUMIN  3.6 01/21/2024   BILITOT 0.9 01/21/2024   ALKPHOS 64 01/21/2024   AST 27 01/21/2024   ALT 24 01/21/2024   ANIONGAP 11 01/23/2024     The following portions of the patient's history were reviewed and updated as appropriate: past medical history, past surgical history, family history, social history, allergies, medications, and problem list.   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) 12/07/2023   Mild episode of recurrent major depressive disorder 12/05/2023   GAD (generalized anxiety disorder) 12/05/2023   Former smoker 12/05/2023   Hypokalemia 11/13/2023   Vitamin D  deficiency 11/13/2023   Chronic idiopathic constipation 11/13/2023   Cellulitis 09/09/2023   UTI (urinary tract infection) 09/09/2023   History of COPD 06/06/2023   Bipolar disorder (HCC) 06/06/2023   At risk  for polypharmacy 02/07/2023   Dependence on continuous supplemental oxygen  02/07/2023   Acute respiratory failure with hypercapnia (HCC) 12/14/2022   Delirium due to multiple etiologies, acute, hyperactive 12/14/2022   Acute respiratory failure with hypoxia and hypercapnia (HCC) 10/25/2022   Acute on chronic diastolic CHF (congestive heart failure) (HCC) 10/23/2022   Current mild episode of major depressive disorder without prior episode 06/06/2022   Fever 06/04/2022   Acute on chronic diastolic (congestive) heart failure (HCC) 05/16/2022   Acute kidney injury superimposed on chronic kidney disease 02/01/2022   Hypothyroidism 02/01/2022   Chronic pain disorder 02/01/2022   Respiratory failure with hypercapnia (HCC) 12/23/2020   Anemia 12/23/2020   Diabetic peripheral neuropathy (HCC) 08/18/2020   Acute on chronic respiratory failure with hypoxia (HCC) 04/18/2020   Hemoptysis 04/18/2020   Encephalopathy 04/18/2020   Chronic diastolic CHF (congestive heart failure) (HCC) 04/18/2020   Sepsis (HCC) 04/18/2020   Severe obesity (BMI >= 40) (HCC)    Fall at home, initial encounter 06/23/2018   HNP (herniated nucleus pulposus), thoracic 06/23/2018   Essential hypertension 08/28/2017   Obesity, class 3 (HCC) 08/28/2017   COPD exacerbation (HCC) 08/04/2017   Acute and chronic respiratory failure with hypercapnia (HCC)    Pulmonary HTN (HCC)    Encounter for geriatric assessment 02/27/2017   Family history of thyroid  cancer    Hypomagnesemia 10/03/2016   Atypical chest pain    HLD (hyperlipidemia) 09/23/2016   COPD (chronic obstructive pulmonary disease) (HCC) 09/23/2016  Gout 09/23/2016   DVT (deep vein thrombosis) in pregnancy 09/23/2016   Thyroid  cancer (HCC) 09/23/2016   Hypocalcemia 09/23/2016   Acute pulmonary edema (HCC)    Acute hypoxemic respiratory failure (HCC)    Chronic hypoxic respiratory failure (HCC) 07/16/2016   CAP (community acquired pneumonia) 01/23/2016    Multinodular goiter w/ dominant right thyroid  nodule 01/23/2016   Pulmonary hypertension (HCC) 01/23/2016   Obesity hypoventilation syndrome (HCC) 08/26/2015   Chest pain 06/16/2015   Non-insulin  dependent type 2 diabetes mellitus (HCC) 04/24/2015   Leukocytosis 04/24/2015   Type 2 diabetes mellitus with hyperlipidemia (HCC) 04/24/2015   Depression with anxiety 04/24/2015   Benign essential HTN 04/24/2015   Normocytic anemia 04/24/2015   Anxiety 04/24/2015   OSA (obstructive sleep apnea) 05/10/2012   Tobacco abuse 07/04/2011   Past Medical History:  Diagnosis Date   Anxiety    Arthritis    Asthma    Chronic diastolic CHF (congestive heart failure) (HCC)    COPD (chronic obstructive pulmonary disease) (HCC)    Uses Oxygen  at night   Depression    Diabetes mellitus without complication (HCC)    GERD (gastroesophageal reflux disease)    Gout    Headache    migraines   History of DVT of lower extremity    Hypertension    Morbid obesity with BMI of 45.0-49.9, adult (HCC)    Thyroid  cancer (HCC) 09/23/2016   Past Surgical History:  Procedure Laterality Date   BUNIONECTOMY Bilateral    CARDIAC CATHETERIZATION N/A 06/23/2015   Procedure: Right/Left Heart Cath and Coronary Angiography;  Surgeon: Ezra GORMAN Shuck, MD;  Location: [facility] INVASIVE CV LAB;  Service: Cardiovascular;  Laterality: N/A;   COLONOSCOPY WITH PROPOFOL  N/A 12/15/2015   Procedure: COLONOSCOPY WITH PROPOFOL ;  Surgeon: Norleen Hint, MD;  Location: [facility] ENDOSCOPY;  Service: Endoscopy;  Laterality: N/A;   RIGHT HEART CATH N/A 10/01/2019   Procedure: RIGHT HEART CATH;  Surgeon: Shuck Ezra GORMAN, MD;  Location: [facility] INVASIVE CV LAB;  Service: Cardiovascular;  Laterality: N/A;   THYROIDECTOMY  09/19/2016   THYROIDECTOMY N/A 09/19/2016   Procedure: TOTAL THYROIDECTOMY;  Surgeon: Eletha Boas, MD;  Location: [facility] OR;  Service: General;  Laterality: N/A;   TONSILLECTOMY     TOTAL ABDOMINAL HYSTERECTOMY  07/14/10   Family History  Problem  Relation Age of Onset   Cancer Father        thought to be due to exposure to concrete   Diabetes Mother    Thyroid  cancer Mother        dx in her 52s-60s   Cancer Maternal Uncle        2 uncles with cancer NOS   Brain cancer Paternal Aunt    Cancer Cousin        maternal first cousin - NOS   Cancer Cousin        maternal first cousin - NOS    Current Outpatient Medications:    Accu-Chek Softclix Lancets lancets, Use to check blood sugar 3 times daily, Disp: 100 each, Rfl: 12   acetaminophen  (TYLENOL ) 325 MG tablet, Take 2 tablets (650 mg total) by mouth every 6 (six) hours as needed for mild pain (pain score 1-3) or fever (or Fever >/= 101)., Disp: , Rfl:    albuterol  (VENTOLIN  HFA) 108 (90 Base) MCG/ACT inhaler, Inhale 1-2 puffs into the lungs every 6 (six) hours as needed for wheezing or shortness of breath., Disp: 1 each, Rfl: 1   allopurinol  (ZYLOPRIM ) 100 MG tablet,  Take 1 tablet (100 mg total) by mouth daily., Disp: 90 tablet, Rfl: 3   aspirin  EC 81 MG tablet, Take 1 tablet (81 mg total) by mouth daily. Swallow whole., Disp: 90 tablet, Rfl: 3   blood glucose meter kit and supplies KIT, Dispense based on patient and insurance preference. Use up to four times daily as directed., Disp: 1 each, Rfl: 0   Blood Glucose Monitoring Suppl DEVI, 1 each by Does not apply route in the morning, at noon, and at bedtime. May substitute to any manufacturer covered by patient's insurance., Disp: 1 each, Rfl: 0   budesonide  (PULMICORT ) 0.25 MG/2ML nebulizer solution, Inhale 2 mLs (0.25 mg total) by nebulization 2 (two) times daily., Disp: 120 mL, Rfl: 0   busPIRone  (BUSPAR ) 30 MG tablet, Take 1 tablet (30 mg total) by mouth 2 (two) times daily., Disp: 180 tablet, Rfl: 3   carvedilol  (COREG ) 6.25 MG tablet, Take 1 tablet (6.25 mg total) by mouth 2 (two) times daily., Disp: 60 tablet, Rfl: 11   clotrimazole  (LOTRIMIN ) 1 % cream, Apply topically 2 (two) times daily., Disp: 30 g, Rfl: 0   diclofenac   Sodium (VOLTAREN ) 1 % GEL, Apply 4 g topically 4 (four) times daily., Disp: 120 g, Rfl: 0   dicyclomine  (BENTYL ) 20 MG tablet, Take 1 tablet (20 mg total) by mouth 2 (two) times daily., Disp: 20 tablet, Rfl: 0   EPINEPHrine  0.3 mg/0.3 mL IJ SOAJ injection, Inject 0.3 mg into the muscle once as needed for anaphylaxis., Disp: , Rfl:    fenofibrate  (TRICOR ) 145 MG tablet, Take 1 tablet (145 mg total) by mouth daily., Disp: 30 tablet, Rfl: 11   FEROSUL 325 (65 Fe) MG tablet, Take 1 tablet (325 mg total) by mouth daily., Disp: 30 tablet, Rfl: 11   gabapentin (NEURONTIN) 100 MG capsule, Take 1 capsule 3 times a day by oral route as needed for 30 days., Disp: , Rfl:    glucose blood (ACCU-CHEK GUIDE TEST) test strip, Use to check blood sugar 3 times daily, Disp: 100 each, Rfl: 12   ipratropium-albuterol  (DUONEB) 0.5-2.5 (3) MG/3ML SOLN, Take 3 mLs by nebulization every 4 (four) hours as needed., Disp: 360 mL, Rfl: 0   levocetirizine (XYZAL ) 5 MG tablet, TAKE 1 TABLET (5 MG TOTAL) BY MOUTH AT BEDTIME., Disp: 30 tablet, Rfl: 0   levothyroxine  (SYNTHROID ) 125 MCG tablet, Take 1 tablet (125 mcg total) by mouth at bedtime., Disp: 90 tablet, Rfl: 0   losartan  (COZAAR ) 25 MG tablet, Take 1 tablet (25 mg total) by mouth daily., Disp: 30 tablet, Rfl: 11   lurasidone  (LATUDA ) 20 MG TABS tablet, Take 1 tablet (20 mg total) by mouth at bedtime. Take with a snack. This is for mood and sleep., Disp: 90 tablet, Rfl: 3   nitroGLYCERIN  (NITROSTAT ) 0.4 MG SL tablet, Place 1 tablet (0.4 mg total) under the tongue every 5 (five) minutes as needed for chest pain., Disp: 12 tablet, Rfl: 6   omeprazole  (PRILOSEC) 20 MG capsule, Take 2 capsules (40 mg total) by mouth daily., Disp: 180 capsule, Rfl: 1   ondansetron  (ZOFRAN -ODT) 4 MG disintegrating tablet, Take 1 tablet (4 mg total) by mouth every 8 (eight) hours as needed for nausea or vomiting., Disp: 20 tablet, Rfl: 0   OXYGEN , Inhale 2 L into the lungs continuous., Disp: , Rfl:     Rimegepant Sulfate (NURTEC) 75 MG TBDP, Take 1 tablet (75 mg total) by mouth every other day. Prevents and Treats Migraine Headaches, Disp: 45  tablet, Rfl: 3   rizatriptan (MAXALT) 10 MG tablet, Take 1 tablet (10 mg total) by mouth as needed for migraine. May repeat in 2 hours if needed, Disp: 10 tablet, Rfl: 0   rosuvastatin  (CRESTOR ) 10 MG tablet, Take 1 tablet (10 mg total) by mouth every evening., Disp: 30 tablet, Rfl: 11   spironolactone  (ALDACTONE ) 25 MG tablet, Take 0.5 tablets (12.5 mg total) by mouth daily., Disp: 15 tablet, Rfl: 0   torsemide  (DEMADEX ) 20 MG tablet, Take 2 tablets (40 mg total) by mouth 2 (two) times daily., Disp: 120 tablet, Rfl: 0   umeclidinium-vilanterol (ANORO ELLIPTA ) 62.5-25 MCG/ACT AEPB, Inhale 1 puff into the lungs daily., Disp: 98 each, Rfl: 0   Vitamin D , Ergocalciferol , (DRISDOL ) 1.25 MG (50000 UNIT) CAPS capsule, Take 1 capsule (50,000 Units total) by mouth every 7 (seven) days., Disp: 12 capsule, Rfl: 0 Allergies  Allergen Reactions   Bee Venom Anaphylaxis    All over my body (swelling)   Fioricet  [Butalbital -Apap-Caffeine ] Nausea And Vomiting and Rash   Ibuprofen  Rash and Other (See Comments)    Severe rash   Lamisil  [Terbinafine ] Rash and Other (See Comments)    Pt states this causes her to feel funny   Nsaids Other (See Comments)    Per MD's orders      ROS: A complete ROS was performed with pertinent positives/negatives noted in the HPI. The remainder of the ROS are negative.    Objective:   There were no vitals filed for this visit.  GENERAL: Well-appearing, in NAD. Well nourished.  SKIN: Pink, warm and dry. No rash. NECK: Trachea midline. Full ROM w/o pain or tenderness. No lymphadenopathy.  RESPIRATORY: Chest wall symmetrical. Respirations even and non-labored. Breath sounds clear to auscultation bilaterally.  CARDIAC: S1, S2 present, regular rate and rhythm. Peripheral pulses 2+ bilaterally.  EXTREMITIES: Without clubbing,  cyanosis, or edema.  PSYCH/MENTAL STATUS: Alert, oriented x 3. Cooperative, appropriate mood and affect.    No results found for any visits on 01/28/24.    Assessment & Plan:     There are no diagnoses linked to this encounter. No orders of the defined types were placed in this encounter.  No orders of the defined types were placed in this encounter.  Lab Orders  No laboratory test(s) ordered today   No images are attached to the encounter or orders placed in the encounter.  No follow-ups on file.   Rosina Senters, FNP

## 2024-01-30 ENCOUNTER — Telehealth: Payer: Self-pay

## 2024-01-30 NOTE — Transitions of Care (Post Inpatient/ED Visit) (Signed)
   01/30/2024  Name: Michele Owens MRN: [id number] DOB: Feb 19, 1961  Today's TOC FU Call Status: Today's TOC FU Call Status:: Unsuccessful Call (3rd Attempt) Unsuccessful Call (3rd Attempt) Date: 01/30/24  Attempted to reach the patient regarding the most recent Inpatient/ED visit. Left a HIPAA approved voicemail message to phone number provided in demographics per DPR.    Follow Up Plan: No further outreach attempts will be made at this time. We have been unable to contact the patient.  Richerd Fish, RN, BSN, CCM [facility], [facility] Health RN Care Manager Direct Dial: [phone]

## 2024-01-31 ENCOUNTER — Telehealth: Payer: Self-pay

## 2024-01-31 NOTE — Telephone Encounter (Signed)
 Called Pt to inform that [facility] isn't covered through insurance; Pt stated she will need to call back and hung up phone. (1st attempt made)

## 2024-02-06 ENCOUNTER — Inpatient Hospital Stay (HOSPITAL_COMMUNITY)

## 2024-02-07 ENCOUNTER — Emergency Department (HOSPITAL_COMMUNITY)

## 2024-02-07 ENCOUNTER — Other Ambulatory Visit: Payer: Self-pay

## 2024-02-07 ENCOUNTER — Encounter (HOSPITAL_COMMUNITY): Payer: Self-pay

## 2024-02-07 ENCOUNTER — Emergency Department (HOSPITAL_COMMUNITY): Admission: EM | Admit: 2024-02-07 | Discharge: 2024-02-07 | Disposition: A | Discharge status: Home / Self Care

## 2024-02-07 DIAGNOSIS — R262 Difficulty in walking, not elsewhere classified: Secondary | ICD-10-CM | POA: Diagnosis present

## 2024-02-07 DIAGNOSIS — M1710 Unilateral primary osteoarthritis, unspecified knee: Secondary | ICD-10-CM | POA: Insufficient documentation

## 2024-02-07 DIAGNOSIS — Z7982 Long term (current) use of aspirin: Secondary | ICD-10-CM | POA: Diagnosis not present

## 2024-02-07 LAB — CBC WITH DIFFERENTIAL/PLATELET
Abs Immature Granulocytes: 0.09 K/uL — ABNORMAL HIGH (ref 0.00–0.07)
Basophils Absolute: 0.1 K/uL (ref 0.0–0.1)
Basophils Relative: 0 %
Eosinophils Absolute: 0.2 K/uL (ref 0.0–0.5)
Eosinophils Relative: 1 %
HCT: 37.1 % (ref 36.0–46.0)
Hemoglobin: 11.3 g/dL — ABNORMAL LOW (ref 12.0–15.0)
Immature Granulocytes: 1 %
Lymphocytes Relative: 14 %
Lymphs Abs: 1.9 K/uL (ref 0.7–4.0)
MCH: 30 pg (ref 26.0–34.0)
MCHC: 30.5 g/dL (ref 30.0–36.0)
MCV: 98.4 fL (ref 80.0–100.0)
Monocytes Absolute: 0.9 K/uL (ref 0.1–1.0)
Monocytes Relative: 6 %
Neutro Abs: 10.3 K/uL — ABNORMAL HIGH (ref 1.7–7.7)
Neutrophils Relative %: 78 %
Platelets: 265 K/uL (ref 150–400)
RBC: 3.77 MIL/uL — ABNORMAL LOW (ref 3.87–5.11)
RDW: 13.2 % (ref 11.5–15.5)
WBC: 13.4 K/uL — ABNORMAL HIGH (ref 4.0–10.5)
nRBC: 0 % (ref 0.0–0.2)

## 2024-02-07 LAB — BASIC METABOLIC PANEL WITH GFR
Anion gap: 14 (ref 5–15)
BUN: 14 mg/dL (ref 8–23)
CO2: 33 mmol/L — ABNORMAL HIGH (ref 22–32)
Calcium: 8 mg/dL — ABNORMAL LOW (ref 8.9–10.3)
Chloride: 95 mmol/L — ABNORMAL LOW (ref 98–111)
Creatinine, Ser: 0.89 mg/dL (ref 0.44–1.00)
GFR, Estimated: >60 mL/min (ref >60)
Glucose, Bld: 144 mg/dL — ABNORMAL HIGH (ref 70–99)
Potassium: 3.9 mmol/L (ref 3.5–5.1)
Sodium: 142 mmol/L (ref 135–145)

## 2024-02-07 MED ORDER — ACETAMINOPHEN 500 MG PO TABS
1000.0000 mg | ORAL_TABLET | Freq: Once | ORAL | Status: AC
  Administered 2024-02-07: 1000 mg via ORAL
  Filled 2024-02-07: qty 2

## 2024-02-07 NOTE — ED Triage Notes (Signed)
 PT brought in by [facility], PT states she is having knee pain more in the right leg but states also left leg as well. PT states she is chronic, PT states she has her pain meds at home. PT states she took them at 2pm today. PT alert and talking VSS and walked to wheelchair with help.

## 2024-02-07 NOTE — ED Notes (Signed)
 [facility] called, 20 min

## 2024-02-07 NOTE — ED Provider Triage Note (Signed)
 Emergency Medicine Provider Triage Evaluation Note  Michele Owens , a 63 y.o. female  was evaluated in triage.  Pt complains of worsening bilateral knee pain and migraine headache.  She has known end-stage arthritis, history of type II DM, chronic diastolic CHF, DVTs.  Patient not candidate for surgery due to multiple comorbidities and obesity  Review of Systems  Positive:  Negative:   Physical Exam  BP 123/82   Pulse 81   Temp 98.2 F (36.8 C)   Resp 18   Ht 5' 5 (1.651 m)   Wt (!) 153.3 kg   SpO2 96%   BMI 56.25 kg/m  Gen:   Awake, no distress   Resp:  Normal effort  MSK:   Moves extremities without difficulty  Other:    Medical Decision Making  Medically screening exam initiated at 12:51 AM.  Appropriate orders placed.  Michele Owens was informed that the remainder of the evaluation will be completed by another provider, this initial triage assessment does not replace that evaluation, and the importance of remaining in the ED until their evaluation is complete.     Michele Owens, [place] 02/07/24 [phone]

## 2024-02-07 NOTE — Discharge Instructions (Addendum)
 Please follow-up with your primary doctor regarding further physical therapy options.  I have referred you for home health physical therapy.  You may take over-the-counter medications such as Tylenol  or use topical diclofenac  gel to help with your knee pain.  You may also see your primary doctor or orthopedist for steroid injections which may alleviate some of your symptoms.  Please continue to use your walker, please return for fevers, chills, severe pain, lightheadedness, chest pain, difficulty breathing, or any new or worsening symptoms that are concerning to you

## 2024-02-07 NOTE — ED Provider Notes (Addendum)
 [place] [facility]MENT AT [facility] Provider Note   CSN: [id number] Arrival date & time: 02/07/24  0021     Patient presents with: Knee Pain   Michele Owens is a 63 y.o. female.   This a 63 year old female presenting emergency department with ambulatory dysfunction and knee pain.  Reports that she has some chronic arthritis and knee pain.  She ambulates with a walker at baseline reports that her legs will intermittently give out on her.  When pressed she states it is typically a pain in her knee that causes her legs to buckle.  Symptoms have not acutely changed or worsened.  No redness or swelling to her knees.  No fevers.   Knee Pain      Prior to Admission medications   Medication Sig Start Date End Date Taking? Authorizing Provider  Accu-Chek Softclix Lancets lancets Use to check blood sugar 3 times daily 12/26/23   [name], MD  acetaminophen  (TYLENOL ) 325 MG tablet Take 2 tablets (650 mg total) by mouth every 6 (six) hours as needed for mild pain (pain score 1-3) or fever (or Fever >/= 101). 01/23/24   Arrien, Mauricio Daniel, MD  albuterol  (VENTOLIN  HFA) 108 (90 Base) MCG/ACT inhaler Inhale 1-2 puffs into the lungs every 6 (six) hours as needed for wheezing or shortness of breath. 12/26/23   [name], MD  allopurinol  (ZYLOPRIM ) 100 MG tablet Take 1 tablet (100 mg total) by mouth daily. 11/13/23 11/07/24  [name], MD  aspirin  EC 81 MG tablet Take 1 tablet (81 mg total) by mouth daily. Swallow whole. 11/13/23 11/07/24  [name], MD  blood glucose meter kit and supplies KIT Dispense based on patient and insurance preference. Use up to four times daily as directed. 12/25/20   Lue Elsie BROCKS, MD  Blood Glucose Monitoring Suppl DEVI 1 each by Does not apply route in the morning, at noon, and at bedtime. May substitute to any manufacturer covered by patient's insurance. 11/13/23   [name], MD  budesonide  (PULMICORT )  0.25 MG/2ML nebulizer solution Inhale 2 mLs (0.25 mg total) by nebulization 2 (two) times daily. 11/13/23   [name], MD  busPIRone  (BUSPAR ) 30 MG tablet Take 1 tablet (30 mg total) by mouth 2 (two) times daily. 12/05/23 11/29/24  [name], MD  carvedilol  (COREG ) 6.25 MG tablet Take 1 tablet (6.25 mg total) by mouth 2 (two) times daily. 12/26/23   [name], MD  clotrimazole  (LOTRIMIN ) 1 % cream Apply topically 2 (two) times daily. 12/13/23   [name], MD  diclofenac  Sodium (VOLTAREN ) 1 % GEL Apply 4 g topically 4 (four) times daily. 12/15/23   Freddi Hamilton, MD  dicyclomine  (BENTYL ) 20 MG tablet Take 1 tablet (20 mg total) by mouth 2 (two) times daily. 12/26/23   [name], MD  EPINEPHrine  0.3 mg/0.3 mL IJ SOAJ injection Inject 0.3 mg into the muscle once as needed for anaphylaxis.    [provider]  fenofibrate  (TRICOR ) 145 MG tablet Take 1 tablet (145 mg total) by mouth daily. 12/26/23   [name], MD  FEROSUL 325 (65 Fe) MG tablet Take 1 tablet (325 mg total) by mouth daily. 12/26/23   [name], MD  gabapentin (NEURONTIN) 100 MG capsule Take 1 capsule 3 times a day by oral route as needed for 30 days. 12/06/23   [provider]  glucose blood (ACCU-CHEK GUIDE TEST) test strip Use  to check blood sugar 3 times daily 12/26/23   [name], MD  ipratropium-albuterol  (DUONEB) 0.5-2.5 (3) MG/3ML SOLN Take 3 mLs by nebulization every 4 (four) hours as needed. 01/23/24   Arrien, Mauricio Daniel, MD  levocetirizine (XYZAL ) 5 MG tablet TAKE 1 TABLET (5 MG TOTAL) BY MOUTH AT BEDTIME. 12/17/23   [name], MD  levothyroxine  (SYNTHROID ) 125 MCG tablet Take 1 tablet (125 mcg total) by mouth at bedtime. 01/02/24   [name], MD  losartan  (COZAAR ) 25 MG tablet Take 1 tablet (25 mg total) by mouth daily. 11/13/23   [name], MD  lurasidone  (LATUDA ) 20 MG TABS tablet Take 1 tablet (20 mg total) by mouth at bedtime. Take  with a snack. This is for mood and sleep. 12/25/23 12/19/24  [name], MD  nitroGLYCERIN  (NITROSTAT ) 0.4 MG SL tablet Place 1 tablet (0.4 mg total) under the tongue every 5 (five) minutes as needed for chest pain. 12/25/23   [name], MD  omeprazole  (PRILOSEC) 20 MG capsule Take 2 capsules (40 mg total) by mouth daily. 11/06/23   [name], MD  ondansetron  (ZOFRAN -ODT) 4 MG disintegrating tablet Take 1 tablet (4 mg total) by mouth every 8 (eight) hours as needed for nausea or vomiting. 12/26/23   [name], MD  OXYGEN  Inhale 2 L into the lungs continuous.    [provider]  Rimegepant Sulfate (NURTEC) 75 MG TBDP Take 1 tablet (75 mg total) by mouth every other day. Prevents and Treats Migraine Headaches 12/25/23 12/19/24  [name], MD  rizatriptan (MAXALT) 10 MG tablet Take 1 tablet (10 mg total) by mouth as needed for migraine. May repeat in 2 hours if needed 01/23/24   Arrien, Elidia Sieving, MD  rosuvastatin  (CRESTOR ) 10 MG tablet Take 1 tablet (10 mg total) by mouth every evening. 11/13/23   [name], MD  spironolactone  (ALDACTONE ) 25 MG tablet Take 0.5 tablets (12.5 mg total) by mouth daily. 01/23/24   Arrien, Mauricio Daniel, MD  torsemide  (DEMADEX ) 20 MG tablet Take 2 tablets (40 mg total) by mouth 2 (two) times daily. 01/23/24   Arrien, Mauricio Daniel, MD  umeclidinium-vilanterol (ANORO ELLIPTA ) 62.5-25 MCG/ACT AEPB Inhale 1 puff into the lungs daily. 01/02/24   [name], MD  Vitamin D , Ergocalciferol , (DRISDOL ) 1.25 MG (50000 UNIT) CAPS capsule Take 1 capsule (50,000 Units total) by mouth every 7 (seven) days. 11/13/23   [name], MD    Allergies: Bee venom, Fioricet  [butalbital -apap-caffeine ], Ibuprofen , Lamisil  [terbinafine ], and Nsaids    Review of Systems  Updated Vital Signs BP 127/72 (BP Location: Left Arm)   Pulse 74   Temp 98.8 F (37.1 C) (Oral)   Resp 18   Ht 5' 5 (1.651 m)   Wt (!) 153.3 kg   SpO2 95%    BMI 56.25 kg/m   Physical Exam Vitals and nursing note reviewed.  Constitutional:      General: She is not in acute distress.    Appearance: She is not toxic-appearing.  HENT:     Head: Normocephalic.     Nose: Nose normal.     Mouth/Throat:     Mouth: Mucous membranes are moist.  Eyes:     Conjunctiva/sclera: Conjunctivae normal.  Cardiovascular:     Rate and Rhythm: Normal rate and regular rhythm.  Pulmonary:     Effort: Pulmonary effort is normal.  Abdominal:     General: Abdomen is flat. There is  no distension.     Tenderness: There is no abdominal tenderness. There is no guarding or rebound.  Musculoskeletal:        General: Normal range of motion.     Comments: No erythema or warmth to bilateral knees.  Able to extend at the knee, but does have some equal weakness, 4 out of 5.  Normal sensation.  Soft compartments.  Skin:    Capillary Refill: Capillary refill takes less than 2 seconds.  Neurological:     Mental Status: She is alert and oriented to person, place, and time.  Psychiatric:        Mood and Affect: Mood normal.        Behavior: Behavior normal.     (all labs ordered are listed, but only abnormal results are displayed) Labs Reviewed  CBC WITH DIFFERENTIAL/PLATELET - Abnormal; Notable for the following components:      Result Value   WBC 13.4 (*)    RBC 3.77 (*)    Hemoglobin 11.3 (*)    Neutro Abs 10.3 (*)    Abs Immature Granulocytes 0.09 (*)    All other components within normal limits  BASIC METABOLIC PANEL WITH GFR - Abnormal; Notable for the following components:   Chloride 95 (*)    CO2 33 (*)    Glucose, Bld 144 (*)    Calcium  8.0 (*)    All other components within normal limits    EKG: None  Radiology: DG Knee 2 Views Right Result Date: 02/07/2024 EXAM: 1 or 2 VIEW(S) XRAY OF THE RIGHT KNEE 02/07/2024 01:16:00 AM COMPARISON: None available. CLINICAL HISTORY: knee pain. Encounter for knee pain FINDINGS: BONES AND JOINTS: No acute  fracture. No focal osseous lesion. No joint dislocation. No significant joint effusion. Mild-to-moderate degenerative changes of the knee. SOFT TISSUES: The soft tissues are unremarkable. IMPRESSION: 1. No acute abnormality. Electronically signed by: Morgane Naveau MD 02/07/2024 01:21 AM EDT RP Workstation: [id number]   DG Knee 2 Views Left Result Date: 02/07/2024 EXAM: 1 OR 2 VIEW(S) XRAY OF THE LEFT KNEE 02/07/2024 01:16:00 AM COMPARISON: 10/19/2023 CLINICAL HISTORY: knee pain. FINDINGS: BONES AND JOINTS: No acute fracture. No focal osseous lesion. No joint dislocation. No significant joint effusion. Moderate to advanced tricompartmental degenerative changes with joint space narrowing and spurring, most pronounced in the lateral compartment. SOFT TISSUES: The soft tissues are unremarkable. IMPRESSION: 1. No acute findings. 2. 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 Electronically signed by: Franky Crease MD 02/07/2024 01:18 AM EDT RP Workstation: [id number]     Procedures   Medications Ordered in the ED  acetaminophen  (TYLENOL ) tablet 1,000 mg (1,000 mg Oral Given 02/07/24 0924)    Clinical Course as of 02/07/24 0925  Thu Feb 07, 2024  0906 Echo 01/22/24: 1. Left ventricular ejection fraction, by estimation, is 55 to 60%. The  left ventricle has normal function. The left ventricle has no regional  wall motion abnormalities. Left ventricular diastolic parameters are  indeterminate. There is the  interventricular septum is flattened in systole and diastole, consistent  with right ventricular pressure and volume overload.   [TY]  0906 CBC with Differential(!) Seemingly downtrending [TY]  0906 Basic metabolic panel(!) Normal renal function [TY]  0906 DG Knee 2 Views Right No acute process [TY]  0906 DG Knee 2 Views Left No acute process [TY]    Clinical Course User Index [TY] Neysa Caron PARAS, DO  Medical Decision Making This is a 63 year old female presenting emergency department with knee pain and ambulatory dysfunction.  She is afebrile nontachycardic, normotensive.  She is on home oxygen  maintaining saturations on room air.  Does not appear to be in distress.  Physical exam without obvious infection.  Does not appear to have traumatic injury.  Her symptoms seemingly are chronic in nature and likely secondary to deconditioning and her morbid obesity and underlying arthritis.  Discussed over-the-counter medications, PCP/ortho for steroid injections. Ultimately, I think she would benefit from physical therapy.  Workup reassuring as noted in the ED course, patient stable for discharge at this time.  Amount and/or Complexity of Data Reviewed External Data Reviewed:     Details: Not on blood thinner.  Does have a history of gout Labs:  Decision-making details documented in ED Course. Radiology: independent interpretation performed. Decision-making details documented in ED Course.  Risk OTC drugs. Decision regarding hospitalization. Diagnosis or treatment significantly limited by social determinants of health. Risk Details: Poor health literacy       Final diagnoses:  Arthritis of knee  Ambulatory dysfunction    ED Discharge Orders     None          Neysa Caron PARAS, DO 02/07/24 0919    Neysa Caron PARAS, DO 02/07/24 [phone]

## 2024-02-11 ENCOUNTER — Encounter: Payer: Self-pay | Admitting: Family Medicine

## 2024-02-13 ENCOUNTER — Ambulatory Visit (HOSPITAL_COMMUNITY)
Admission: RE | Admit: 2024-02-13 | Discharge: 2024-02-13 | Disposition: A | Discharge status: Home / Self Care | Source: Ambulatory Visit | Attending: Cardiology | Admitting: Cardiology

## 2024-02-13 ENCOUNTER — Encounter (HOSPITAL_COMMUNITY): Payer: Self-pay

## 2024-02-13 ENCOUNTER — Other Ambulatory Visit (HOSPITAL_COMMUNITY): Payer: Self-pay

## 2024-02-13 VITALS — BP 119/67 | HR 73 | Ht 65.0 in | Wt 301.8 lb

## 2024-02-13 DIAGNOSIS — E785 Hyperlipidemia, unspecified: Secondary | ICD-10-CM | POA: Diagnosis not present

## 2024-02-13 DIAGNOSIS — G4733 Obstructive sleep apnea (adult) (pediatric): Secondary | ICD-10-CM | POA: Insufficient documentation

## 2024-02-13 DIAGNOSIS — M109 Gout, unspecified: Secondary | ICD-10-CM | POA: Diagnosis not present

## 2024-02-13 DIAGNOSIS — I471 Supraventricular tachycardia, unspecified: Secondary | ICD-10-CM | POA: Insufficient documentation

## 2024-02-13 DIAGNOSIS — I5022 Chronic systolic (congestive) heart failure: Secondary | ICD-10-CM | POA: Diagnosis present

## 2024-02-13 DIAGNOSIS — I5032 Chronic diastolic (congestive) heart failure: Secondary | ICD-10-CM

## 2024-02-13 DIAGNOSIS — E669 Obesity, unspecified: Secondary | ICD-10-CM | POA: Diagnosis not present

## 2024-02-13 DIAGNOSIS — J449 Chronic obstructive pulmonary disease, unspecified: Secondary | ICD-10-CM | POA: Insufficient documentation

## 2024-02-13 DIAGNOSIS — Z7982 Long term (current) use of aspirin: Secondary | ICD-10-CM | POA: Insufficient documentation

## 2024-02-13 DIAGNOSIS — Z9981 Dependence on supplemental oxygen: Secondary | ICD-10-CM | POA: Diagnosis not present

## 2024-02-13 DIAGNOSIS — I110 Hypertensive heart disease with heart failure: Secondary | ICD-10-CM | POA: Diagnosis present

## 2024-02-13 DIAGNOSIS — Z79899 Other long term (current) drug therapy: Secondary | ICD-10-CM | POA: Insufficient documentation

## 2024-02-13 DIAGNOSIS — I10 Essential (primary) hypertension: Secondary | ICD-10-CM

## 2024-02-13 DIAGNOSIS — J9611 Chronic respiratory failure with hypoxia: Secondary | ICD-10-CM | POA: Insufficient documentation

## 2024-02-13 DIAGNOSIS — F319 Bipolar disorder, unspecified: Secondary | ICD-10-CM | POA: Diagnosis not present

## 2024-02-13 DIAGNOSIS — Z87891 Personal history of nicotine dependence: Secondary | ICD-10-CM | POA: Diagnosis not present

## 2024-02-13 DIAGNOSIS — Z79890 Hormone replacement therapy (postmenopausal): Secondary | ICD-10-CM | POA: Insufficient documentation

## 2024-02-13 DIAGNOSIS — Z6841 Body Mass Index (BMI) 40.0 and over, adult: Secondary | ICD-10-CM | POA: Diagnosis not present

## 2024-02-13 IMAGING — CT CT ABD-PELV W/ CM
2 of 4 series · 17 of 46 positions shown, 19 images · IV contrast (APPLIED)
Comparison: 03/16/2021

CLINICAL DATA: Acute abdominal pain

EXAM:
CT ABDOMEN AND PELVIS WITH CONTRAST
TECHNIQUE: Multidetector CT imaging of the abdomen and pelvis was performed
using the standard protocol following bolus administration of
intravenous contrast.

[Series 2: abd pel w · axial · 0.98mm/px · z∈[-503,-23]mm · 14 of 106 slices shown, 16 images]
[im 5/106  soft-tissue]
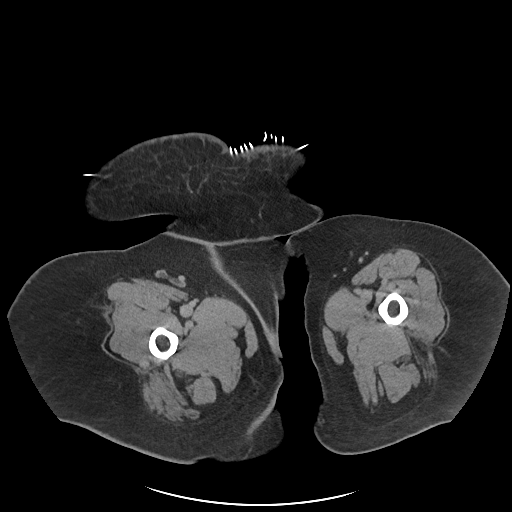
[im 5/106  bone]
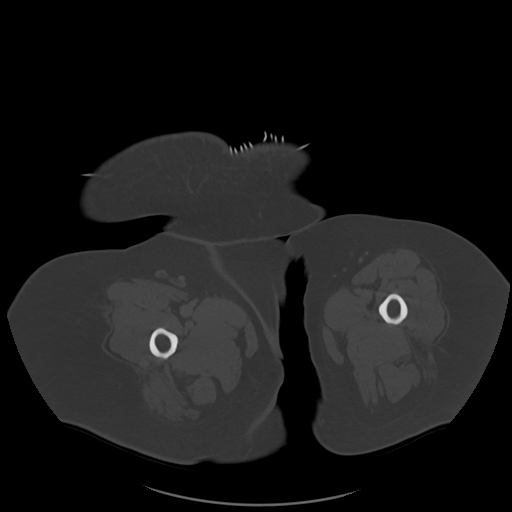
[im 13/106  soft-tissue]
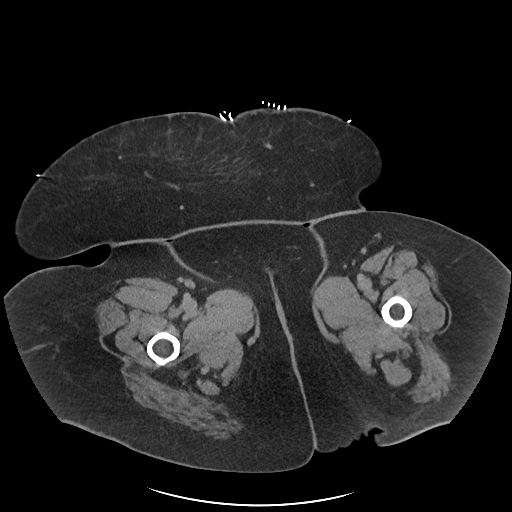
[im 22/106  soft-tissue]
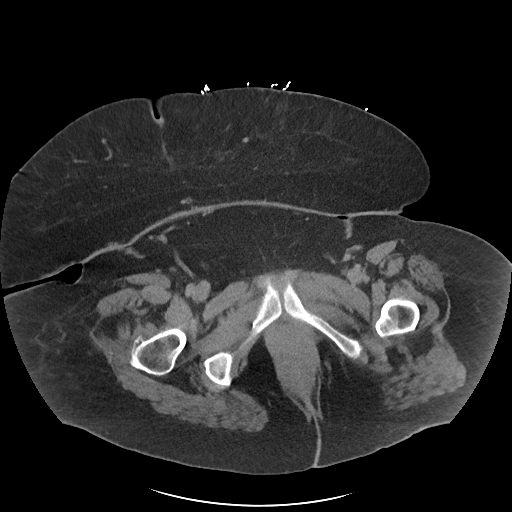
[im 30/106  soft-tissue]
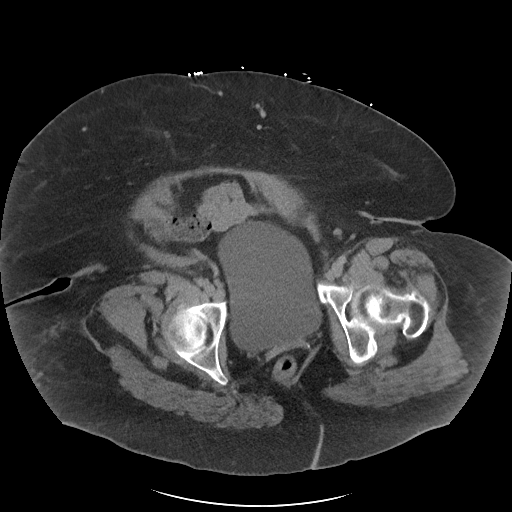
[im 34/106  soft-tissue]
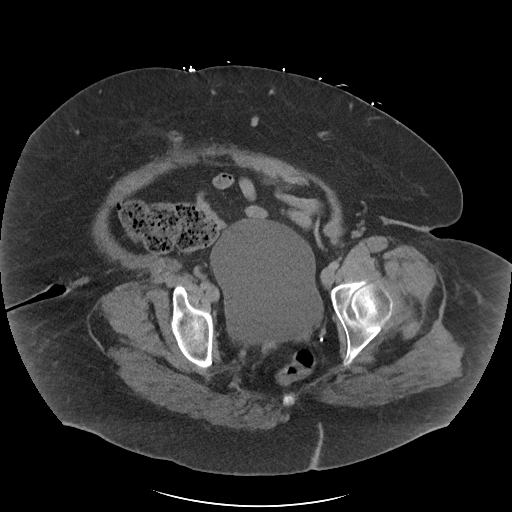
[im 43/106  soft-tissue]
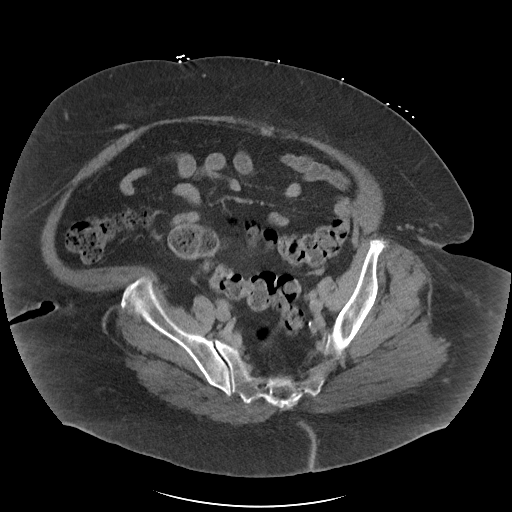
[im 51/106  soft-tissue]
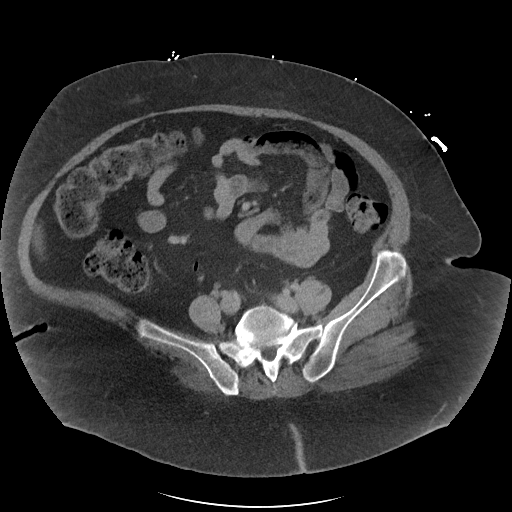
[im 55/106  soft-tissue]
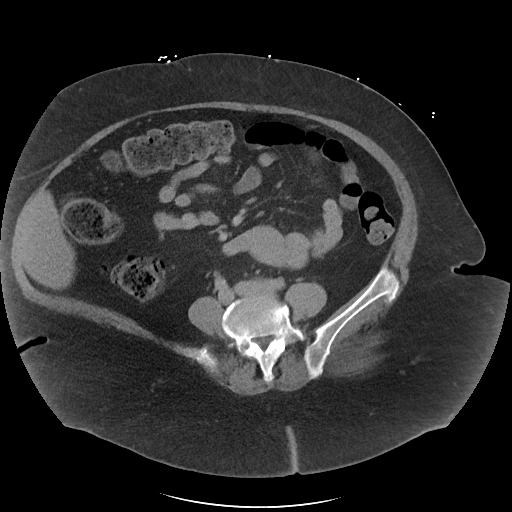
[im 64/106  soft-tissue]
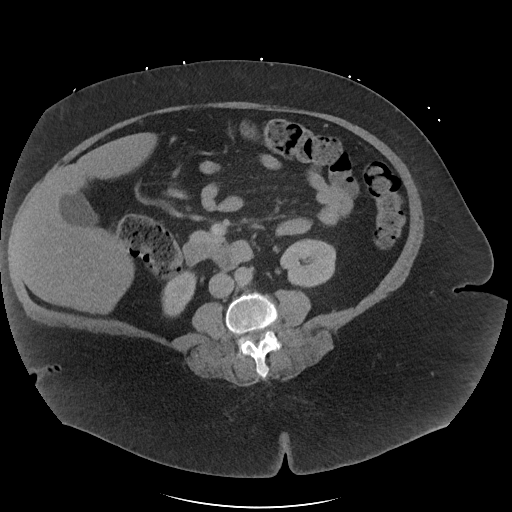
[im 64/106  bone]
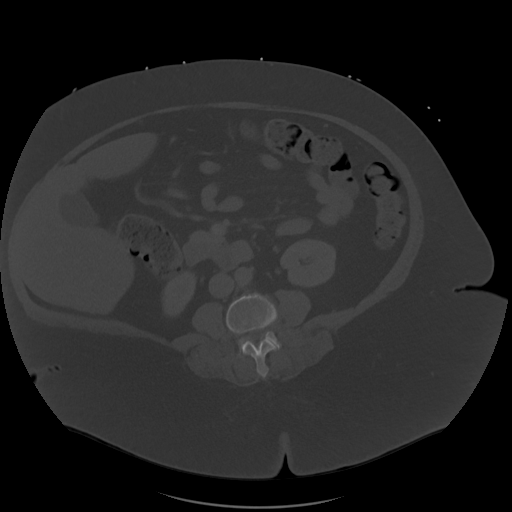
[im 72/106  soft-tissue]
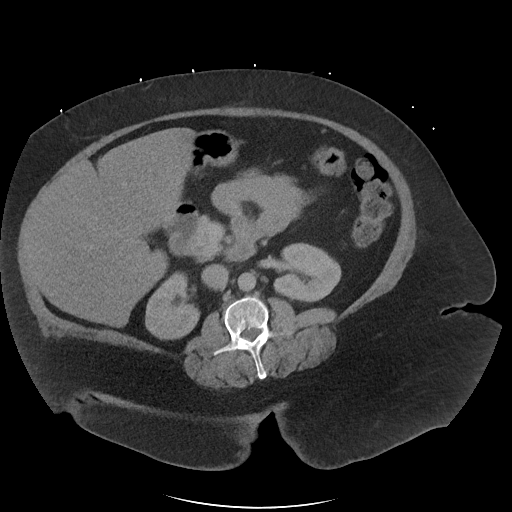
[im 80/106  soft-tissue]
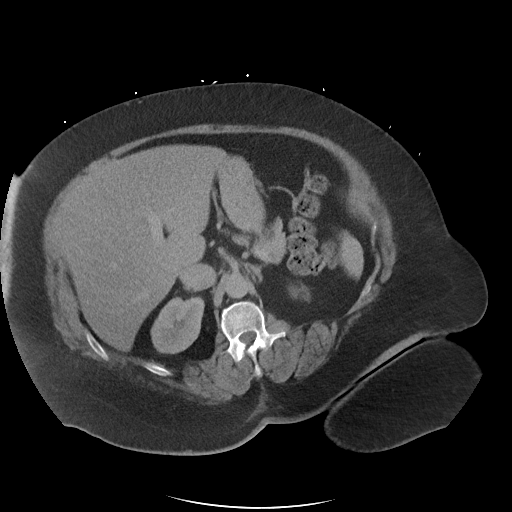
[im 85/106  soft-tissue]
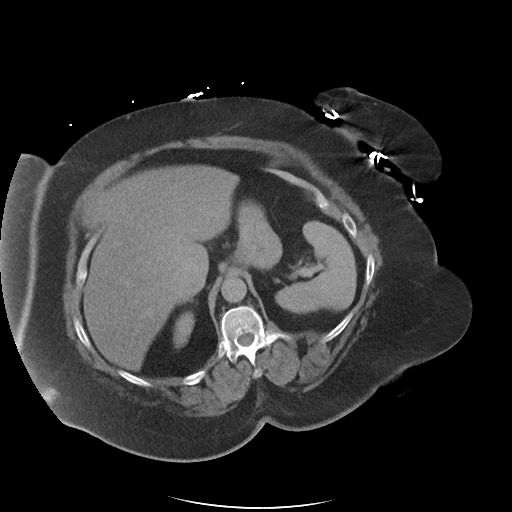
[im 93/106  soft-tissue]
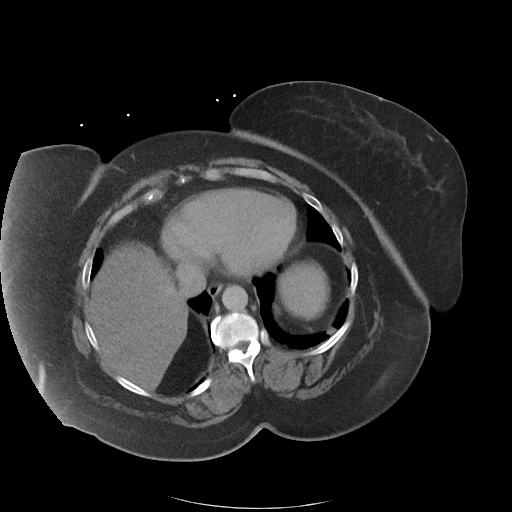
[im 101/106  soft-tissue]
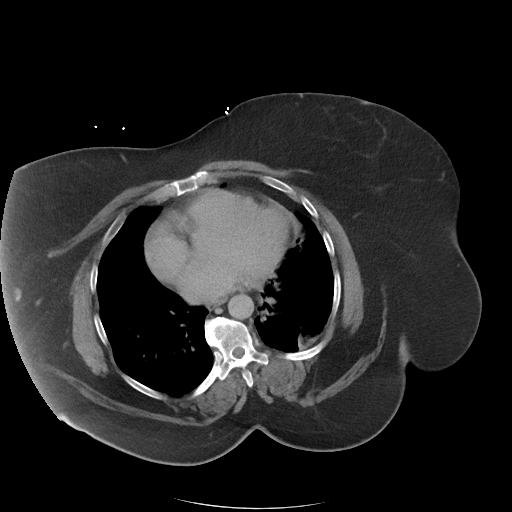

[Series 5: coronal · coronal · 1.04mm/px · 3 of 142 slices shown]
[im 48/142  soft-tissue]
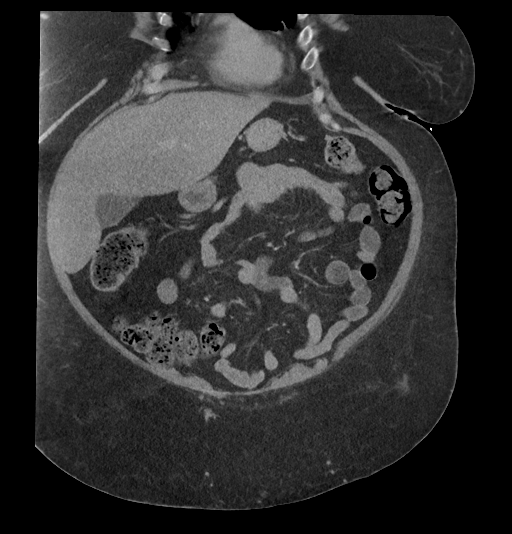
[im 63/142  soft-tissue]
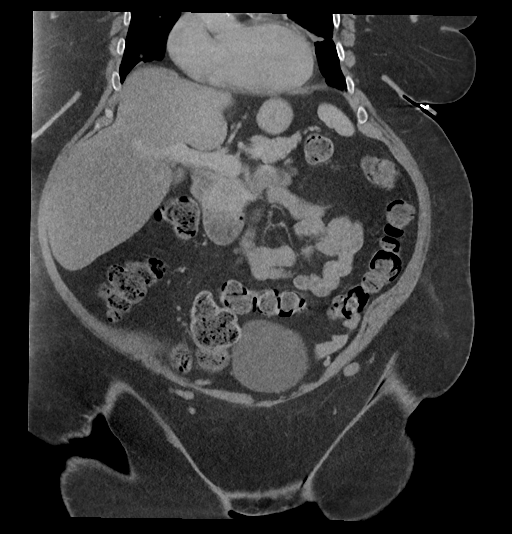
[im 79/142  soft-tissue]
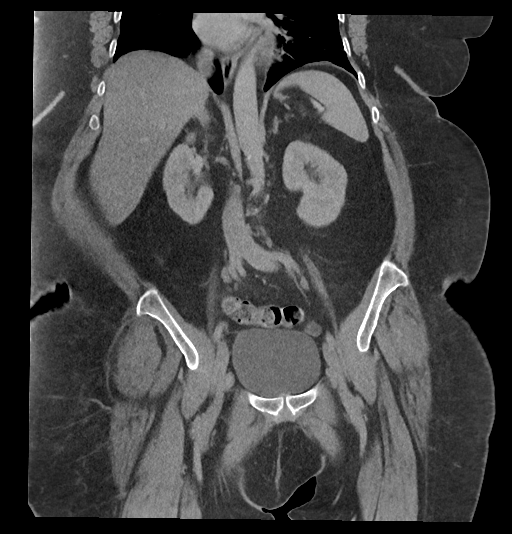

[17 of 46 positions shown; findings below may reference images not displayed]

RADIATION DOSE REDUCTION: This exam was performed according to the
departmental dose-optimization program which includes automated
exposure control, adjustment of the mA and/or kV according to
patient size and/or use of iterative reconstruction technique.

CONTRAST:  100mL OMNIPAQUE IOHEXOL 300 MG/ML  SOLN
FINDINGS: Lower Chest: Bibasilar atelectasis

Hepatobiliary: Hepatomegaly. No intra-or extrahepatic biliary
dilatation. The gallbladder is normal.

Pancreas: Normal pancreas. No ductal dilatation or peripancreatic
fluid collection.

Spleen: Normal.

Adrenals/Urinary Tract: The adrenal glands are normal. No
hydronephrosis, nephroureterolithiasis or solid renal mass. The
urinary bladder is normal for degree of distention

Stomach/Bowel: There is no hiatal hernia. Normal duodenal course and
caliber. No small bowel dilatation or inflammation. No focal colonic
abnormality. Normal appendix.

Vascular/Lymphatic: Normal course and caliber of the major abdominal
vessels. No abdominal or pelvic lymphadenopathy.

Reproductive: Status post hysterectomy. No adnexal mass.

Other: None.

Musculoskeletal: No bony spinal canal stenosis or focal osseous
abnormality.
IMPRESSION: No acute abnormality of the abdomen or pelvis.

## 2024-02-13 MED ORDER — TORSEMIDE 20 MG PO TABS: 40.0000 mg | ORAL_TABLET | Freq: Two times a day (BID) | ORAL | 0 refills | Status: DC

## 2024-02-13 NOTE — Progress Notes (Signed)
 Orders placed for [facility] on 11/24 with Dr. Mclean

## 2024-02-13 NOTE — Patient Instructions (Signed)
 Medication Changes:  INCREASE TORSEMIDE  TO 40MG  TWICE DAILY   Lab Work:  RETURN FOR LABS IN 1 WEEK AS SCHEDULED   Special Instructions // Education:   [facility] [address] [place] [address] Dept: [phone] Loc: [phone]  Blayre Papania  02/13/2024  You are scheduled for a Cardiac Catheterization on Monday, November 24 with Dr. Ezra Shuck.  1. Please arrive at the [facility] (Main Entrance A) at [facility]: [address] [place], [place] [address] at 8:30 AM (This time is 2 hour(s) before your procedure to ensure your preparation).   Free valet parking service is available. You will check in at ADMITTING. The support person will be asked to wait in the waiting room.  It is OK to have someone drop you off and come back when you are ready to be discharged.    Special note: Every effort is made to have your procedure done on time. Please understand that emergencies sometimes delay scheduled procedures.  2. Diet: Nothing to eat after midnight.  3. Hydration: Nothing to eat and drink after midnight. (For TEE and Cath the same day)  4. Labs: You will need to have blood drawn on NEXT WEEK AS SCHEDULED   5. Medication instructions in preparation for your procedure:  DO NOT TAKE TORSEMIDE  OR SPIRONOLACTONE  THE MORNING OF THE PROCEDURE   6. Plan to go home the same day, you will only stay overnight if medically necessary. 7. Bring a current list of your medications and current insurance cards. 8. You MUST have a responsible person to drive you home. 9. Someone MUST be with you the first 24 hours after you arrive home or your discharge will be delayed. 10. Please wear clothes that are easy to get on and off and wear slip-on shoes.  Thank you for allowing us  to care for you!   -- [facility]  Follow-Up in: 4 WEEKS AS SCHEDULED WITH APP CLINIC    At the [facility], you and your health needs are our priority. We have a designated team specialized in the treatment of Heart Failure. This Care Team includes your primary Heart Failure Specialized Cardiologist (physician), Advanced Practice Providers (APPs- Physician Assistants and Nurse Practitioners), and Pharmacist who all work together to provide you with the care you need, when you need it.   You may see any of the following providers on your designated Care Team at your next follow up:  Dr. Toribio Fuel Dr. Ezra Shuck Dr. Odis Brownie Greig Mosses, NP Caffie Shed, [place] [facility] [place], [place] Beckey Coe, NP Jordan Lee, NP Tinnie Redman, PharmD   Please be sure to bring in all your medications bottles to every appointment.   Need to Contact Us :  If you have any questions or concerns before your next appointment please send us  a message through [place] or call our office at [phone].    TO LEAVE A MESSAGE FOR THE NURSE SELECT OPTION 2, PLEASE LEAVE A MESSAGE INCLUDING: YOUR NAME DATE OF BIRTH CALL BACK NUMBER REASON FOR CALL**this is important as we prioritize the call backs  YOU WILL RECEIVE A CALL BACK THE SAME DAY AS LONG AS YOU CALL BEFORE 4:00 PM

## 2024-02-13 NOTE — Progress Notes (Signed)
 Primary Care: Michele Beverley NOVAK, MD HF Cardiology: Dr. Rolan  Reason for Visit: post hospital f/u for chronic diastolic heart failure   HPI: Mrs Michele Owens is a 63 y.o. with a history of  HTN, obesity, OSA, COPD, hyperlipidemia, diastolic CHF, gout, bipolar, and SVT.   Admitted in 8/16 with diastolic CHF and COPD exacerbation.  She was admitted in 1/17 with COPD exacerbation.  She was set up for a Cardiolite  in 2/17.  This showed a small, moderate intensity partially reversible inferolateral defect.  LHC/RHC in 3/17 showed no significant CAD, mildly elevated PCWP.  PFTs in 3/17 showed severe COPD.     Admitted 10/15-10/19/17 with AECOPD and A/C diastolic CHF.  Diuresed with IV lasix . Completed course for CAP. Pt also noted have multinodular goiter on US .  Biopsy was done finally in 3/18, showing papillary thyroid  carcinoma.    Echo in 4/18 showed EF 45-50%, moderate dilated RV with moderately decreased systolic function.    She was admitted again in 4/18 with COPD exacerbation, acute on chronic diastolic CHF, and CAP.     She was admitted in 3/20 with acute on chronic diastolic CHF and treated with IV Lasix .  Echo showed EF 60-65%, mild RV enlargement with normal function, respirophasic septal variation.      Echo in 5/21 showed LVEF 55-60%, RV normal, RVSP was normal.  RHC in 6/21 showed mean RA 11, PA 44/14 mean 27, mean PCWP 15, CI 4.6, PVR 1.1.   Echo in 2/24 showed EF 55-60%, mild LVH, mildly D-shaped septum.    Admitted 9/24 with somnolence/hypoxemia in the setting of hypercapnia likely from polypharmacy. She was put on Bipap and improved.   Felt dizzy 2/25, called EMS and found to be in SVT. Given adenosine and converted to NSR. K was 2.5, but was felt to be dilutional. Labs on re-check showed K 3.8.  Numerous ED visits since for varies reasons: mainly arthritis related pain, atypical chest pain, AKI, dermatitis, nausea/vomiting, etc.   Most recent hospitalization was last month  10/25 for a/c CHF. She had presented w/ worsening dyspnea after missing several days of torsemide . Physical exam and CXR w/ evidence of volume overload, per hospital notes. Echo showed 55-60%, RV mod enlarged, interventricular septum is flattened in systole and diastole, consistent with right ventricular pressure and volume overload. RV systolic fx noted to be normal. She was seen by cardiology and diuresed w/ IV Lasix  and transitioned to PO torsemide  at 20 mg bid. Farxiga  was discontinued d/t recent UTI. All other GDMT continued. Cardiology note from Dr. Ren Ny recommended outpatient RHC.   She presents today for hospital f/u. She is starting to feel her breathing getting worse again. She has had to increase her home supp O2 up from 2>>3L/min. She reports full compliance w/ torsemide , taking 1 tablet twice daily. BP is well controlled at 119/67.     PMH: 1. Back and hip pain 2. HTN 3. Obesity 4. GERD 5. Hyperlipidemia 6. Depression 7. COPD: Has quit smoking. Now on home oxygen  2L [place].  PFTs (3/17) with severe COPD.  8. 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 Event monitor (12/14) with only NSR noted.  10. Chest pain: Atypical.  ETT-Cardiolite  (12/13) with EF 60%, no ischemia/infarction. Lexiscan  Cardiolite  (2/17) with small, moderate-intensity partially reversible inferolateral defect. LHC (3/17): No significant CAD. 11. Syncope: Uncertain etiology.   12. OHS/OSA: Cannot tolerate CPAP. Sleep study negative for OSA in 5/17.  13. Gout 14.  Migraines 15. Diabetes 16. Diastolic CHF: Echo (8/16) with EF 55-60%, grade II diastolic dysfunction.  - RHC (6/82) with mean RA 8, PA 45/20 mean 30, mean PCWP 20; PVR 1.5 WU.  - Echo (4/18): EF 45-50%, diffuse hypokinesis, grade II diastolic dysfunction, moderately dilated RV with moderately decreased systolic function. - Echo (4/20): EF 60-65%, mild RV enlargement with normal systolic function. Respirophasic  septal variation noted.   - RHC (6/21): mean RA 11, PA 44/14 mean 27, mean PCWP 15, CI 4.6, PVR 1.1.  - Echo (2/24): EF 55-60%, mild LVH, mildly D-shaped septum.   17. 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  18. Hypothyroidism  Current Outpatient Medications  Medication Sig Dispense Refill   Accu-Chek Softclix Lancets lancets Use to check blood sugar 3 times daily 100 each 12   acetaminophen  (TYLENOL ) 325 MG tablet Take 2 tablets (650 mg total) by mouth every 6 (six) hours as needed for mild pain (pain score 1-3) or fever (or Fever >/= 101).     albuterol  (VENTOLIN  HFA) 108 (90 Base) MCG/ACT inhaler Inhale 1-2 puffs into the lungs every 6 (six) hours as needed for wheezing or shortness of breath. 1 each 1   allopurinol  (ZYLOPRIM ) 100 MG tablet Take 1 tablet (100 mg total) by mouth daily. 90 tablet 3   aspirin  EC 81 MG tablet Take 1 tablet (81 mg total) by mouth daily. Swallow whole. 90 tablet 3   blood glucose meter kit and supplies KIT Dispense based on patient and insurance preference. Use up to four times daily as directed. 1 each 0   Blood Glucose Monitoring Suppl DEVI 1 each by Does not apply route in the morning, at noon, and at bedtime. May substitute to any manufacturer covered by patient's insurance. 1 each 0   budesonide  (PULMICORT ) 0.25 MG/2ML nebulizer solution Inhale 2 mLs (0.25 mg total) by nebulization 2 (two) times daily. 120 mL 0   busPIRone  (BUSPAR ) 30 MG tablet Take 1 tablet (30 mg total) by mouth 2 (two) times daily. 180 tablet 3   carvedilol  (COREG ) 6.25 MG tablet Take 1 tablet (6.25 mg total) by mouth 2 (two) times daily. 60 tablet 11   clotrimazole  (LOTRIMIN ) 1 % cream Apply topically 2 (two) times daily. 30 g 0   diclofenac  Sodium (VOLTAREN ) 1 % GEL Apply 4 g topically 4 (four) times daily. 120 g 0   dicyclomine  (BENTYL ) 20 MG tablet Take 1 tablet (20 mg total) by mouth 2 (two) times daily. 20 tablet 0   EPINEPHrine  0.3 mg/0.3 mL IJ SOAJ injection Inject 0.3  mg into the muscle once as needed for anaphylaxis.     fenofibrate  (TRICOR ) 145 MG tablet Take 1 tablet (145 mg total) by mouth daily. 30 tablet 11   FEROSUL 325 (65 Fe) MG tablet Take 1 tablet (325 mg total) by mouth daily. 30 tablet 11   gabapentin (NEURONTIN) 100 MG capsule Take 1 capsule 3 times a day by oral route as needed for 30 days.     glucose blood (ACCU-CHEK GUIDE TEST) test strip Use to check blood sugar 3 times daily 100 each 12   ipratropium-albuterol  (DUONEB) 0.5-2.5 (3) MG/3ML SOLN Take 3 mLs by nebulization every 4 (four) hours as needed. 360 mL 0   levocetirizine (XYZAL ) 5 MG tablet TAKE 1 TABLET (5 MG TOTAL) BY MOUTH AT BEDTIME. 30 tablet 0   levothyroxine  (SYNTHROID ) 125 MCG tablet Take 1 tablet (125 mcg total) by mouth at bedtime. 90 tablet 0  losartan  (COZAAR ) 25 MG tablet Take 1 tablet (25 mg total) by mouth daily. 30 tablet 11   lurasidone  (LATUDA ) 20 MG TABS tablet Take 1 tablet (20 mg total) by mouth at bedtime. Take with a snack. This is for mood and sleep. 90 tablet 3   nitroGLYCERIN  (NITROSTAT ) 0.4 MG SL tablet Place 1 tablet (0.4 mg total) under the tongue every 5 (five) minutes as needed for chest pain. 12 tablet 6   omeprazole  (PRILOSEC) 20 MG capsule Take 2 capsules (40 mg total) by mouth daily. 180 capsule 1   ondansetron  (ZOFRAN -ODT) 4 MG disintegrating tablet Take 1 tablet (4 mg total) by mouth every 8 (eight) hours as needed for nausea or vomiting. 20 tablet 0   OXYGEN  Inhale 2 L into the lungs continuous.     Rimegepant Sulfate (NURTEC) 75 MG TBDP Take 1 tablet (75 mg total) by mouth every other day. Prevents and Treats Migraine Headaches 45 tablet 3   rizatriptan (MAXALT) 10 MG tablet Take 1 tablet (10 mg total) by mouth as needed for migraine. May repeat in 2 hours if needed 10 tablet 0   rosuvastatin  (CRESTOR ) 10 MG tablet Take 1 tablet (10 mg total) by mouth every evening. 30 tablet 11   spironolactone  (ALDACTONE ) 25 MG tablet Take 0.5 tablets (12.5 mg  total) by mouth daily. 15 tablet 0   torsemide  (DEMADEX ) 20 MG tablet Take 2 tablets (40 mg total) by mouth 2 (two) times daily. 120 tablet 0   umeclidinium-vilanterol (ANORO ELLIPTA ) 62.5-25 MCG/ACT AEPB Inhale 1 puff into the lungs daily. 98 each 0   Vitamin D , Ergocalciferol , (DRISDOL ) 1.25 MG (50000 UNIT) CAPS capsule Take 1 capsule (50,000 Units total) by mouth every 7 (seven) days. 12 capsule 0   No current facility-administered medications for this encounter.   Allergies  Allergen Reactions   Bee Venom Anaphylaxis    All over my body (swelling)   Fioricet  [Butalbital -Apap-Caffeine ] Nausea And Vomiting and Rash   Ibuprofen  Rash and Other (See Comments)    Severe rash   Lamisil  [Terbinafine ] Rash and Other (See Comments)    Pt states this causes her to feel funny   Nsaids Other (See Comments)    Per MD's orders    Social History   Socioeconomic History   Marital status: Single    Spouse name: Not on file   Number of children: 1   Years of education: Not on file   Highest education level: Not on file  Occupational History   Occupation: disabled  Tobacco Use   Smoking status: Former    Current packs/day: 0.00    Average packs/day: 0.5 packs/day for 41.0 years (20.5 ttl pk-yrs)    Types: Cigarettes    Start date: 07/1975    Quit date: 07/2016    Years since quitting: 7.6   Smokeless tobacco: Never   Tobacco comments:    Pt stop smoking 07/04/2011  Vaping Use   Vaping status: Never Used  Substance and Sexual Activity   Alcohol use: Not Currently    Comment: quit 12 years ago   Drug use: Not Currently    Types: Marijuana, Cocaine    Comment: quit 12 years ago   Sexual activity: Yes  Other Topics Concern   Not on file  Social History Narrative   Not on file   Social Drivers of Health   Financial Resource Strain: Low Risk  (10/24/2022)   Overall Financial Resource Strain (CARDIA)    Difficulty of Paying Living  Expenses: Not very hard  Food Insecurity: No  Food Insecurity (01/22/2024)   Hunger Vital Sign    Worried About Running Out of Food in the Last Year: Never true    Ran Out of Food in the Last Year: Never true  Transportation Needs: No Transportation Needs (01/22/2024)   PRAPARE - Administrator, Civil Service (Medical): No    Lack of Transportation (Non-Medical): No  Physical Activity: Inactive (02/01/2021)   Exercise Vital Sign    Days of Exercise per Week: 0 days    Minutes of Exercise per Session: 0 min  Stress: Stress Concern Present (02/23/2021)   [employer] of Occupational Health - Occupational Stress Questionnaire    Feeling of Stress : To some extent  Social Connections: Moderately Isolated (01/22/2024)   Social Connection and Isolation Panel    Frequency of Communication with Friends and Family: More than three times a week    Frequency of Social Gatherings with Friends and Family: More than three times a week    Attends Religious Services: 1 to 4 times per year    Active Member of [employer] or Organizations: No    Attends Banker Meetings: Never    Marital Status: Divorced  Catering Manager Violence: Not At Risk (01/22/2024)   Humiliation, Afraid, Rape, and Kick questionnaire    Fear of Current or Ex-Partner: No    Emotionally Abused: No    Physically Abused: No    Sexually Abused: No   Family History  Problem Relation Age of Onset   Cancer Father        thought to be due to exposure to concrete   Diabetes Mother    Thyroid  cancer Mother        dx in her 52s-60s   Cancer Maternal Uncle        2 uncles with cancer NOS   Brain cancer Paternal Aunt    Cancer Cousin        maternal first cousin - NOS   Cancer Cousin        maternal first cousin - NOS   Wt Readings from Last 3 Encounters:  02/13/24 (!) 136.9 kg (301 lb 12.8 oz)  02/07/24 (!) 153.3 kg (338 lb)  01/22/24 (!) 148.1 kg (326 lb 8 oz)   BP 119/67   Pulse 73   Ht 5' 5 (1.651 m)   Wt (!) 136.9 kg (301 lb 12.8 oz)    SpO2 90%   BMI 50.22 kg/m   PHYSICAL EXAM: GENERAL: super morbidly obese, NAD Lungs- diminished at the bases  CARDIAC:  thick neck, JVD not well visualized          Normal rate with regular rhythm. Trace b/l pretibial edema  ABDOMEN: obese, soft, non-tender, non-distended.  EXTREMITIES: Warm and well perfused.  NEUROLOGIC: No obvious FND   ECG: not performed   ASSESSMENT & PLAN: 1. Chronic diastolic CHF: Has had multiple prior admissions with CHF and COPD exacerbations.  Echo 05/2022 with EF 55-60%, D-shaped septum, Grade IDD.  Recent echo 10/25 showed EF 55-60%, RV mod enlarged, interventricular septum is flattened in systole and diastole, consistent with right ventricular pressure and volume overload. RV systolic fx noted to be normal. - NYHA Class IIIb confounded by super morbid obesity, deconditioning and arthritis  - w/ increased O2 requirements since d/c from hospital w/ evidence of mild LEE on exam, though full assessment regarding the extensiveness of volume overload is extremely difficult given overall body habitus -  increase torsemide  to 40 mg bid - keep off Farxiga  given h/o GU infections/limited mobility  - d/w Dr. Rolan. Will plan for outpatient RHC to guide further diuretic dosing  - Continue losartan  25 mg daily.  - Continue spironolactone  25 mg daily.  - I reviewed recent hospital labs and SCr/K stable. Will repeat BMP in 1 wk w/ torsemide  increase  - Cardiomems would be beneficial given multiple admissions for CHF and difficult exam. Unfortunately she has Medicaid and they will not cover. 2. HTN: controlled on current regimen - no change  3. Obesity: Body mass index is 50.22 kg/m. - She is interested in GLP1i. Will refer to pharmD  4. Chronic Hypoxemic Respiratory Failure: Suspect due to COPD and OHS. She is on 2L home oxygen  but recently increased to 3L. Suspect volume overload, see plan above   - Should follow with pulmonary.  5. SVT: Episode in 2/25, required 6  mg of adensine to break. 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 - Continue Coreg  to 6.25 mg bid.     F/u w/ APP after RHC   Michele Capers, PA-C 02/13/2024

## 2024-02-13 NOTE — H&P (View-Only) (Signed)
 Primary Care: Michele Beverley NOVAK, MD HF Cardiology: Dr. Rolan  Reason for Visit: post hospital f/u for chronic diastolic heart failure   HPI: [name] is a 63 y.o. with a history of  HTN, obesity, OSA, COPD, hyperlipidemia, diastolic CHF, gout, bipolar, and SVT.   Admitted in 8/16 with diastolic CHF and COPD exacerbation.  She was admitted in 1/17 with COPD exacerbation.  She was set up for a Cardiolite  in 2/17.  This showed a small, moderate intensity partially reversible inferolateral defect.  LHC/RHC in 3/17 showed no significant CAD, mildly elevated PCWP.  PFTs in 3/17 showed severe COPD.     Admitted 10/15-10/19/17 with AECOPD and A/C diastolic CHF.  Diuresed with IV lasix . Completed course for CAP. Pt also noted have multinodular goiter on US .  Biopsy was done finally in 3/18, showing papillary thyroid  carcinoma.    Echo in 4/18 showed EF 45-50%, moderate dilated RV with moderately decreased systolic function.    She was admitted again in 4/18 with COPD exacerbation, acute on chronic diastolic CHF, and CAP.     She was admitted in 3/20 with acute on chronic diastolic CHF and treated with IV Lasix .  Echo showed EF 60-65%, mild RV enlargement with normal function, respirophasic septal variation.      Echo in 5/21 showed LVEF 55-60%, RV normal, RVSP was normal.  RHC in 6/21 showed mean RA 11, PA 44/14 mean 27, mean PCWP 15, CI 4.6, PVR 1.1.   Echo in 2/24 showed EF 55-60%, mild LVH, mildly D-shaped septum.    Admitted 9/24 with somnolence/hypoxemia in the setting of hypercapnia likely from polypharmacy. She was put on Bipap and improved.   Felt dizzy 2/25, called EMS and found to be in SVT. Given adenosine and converted to NSR. K was 2.5, but was felt to be dilutional. Labs on re-check showed K 3.8.  Numerous ED visits since for varies reasons: mainly arthritis related pain, atypical chest pain, AKI, dermatitis, nausea/vomiting, etc.   Most recent hospitalization was last month  10/25 for a/c CHF. She had presented w/ worsening dyspnea after missing several days of torsemide . Physical exam and CXR w/ evidence of volume overload, per hospital notes. Echo showed 55-60%, RV mod enlarged, interventricular septum is flattened in systole and diastole, consistent with right ventricular pressure and volume overload. RV systolic fx noted to be normal. She was seen by cardiology and diuresed w/ IV Lasix  and transitioned to PO torsemide  at 20 mg bid. Farxiga  was discontinued d/t recent UTI. All other GDMT continued. Cardiology note from Dr. Ren Ny recommended outpatient RHC.   She presents today for hospital f/u. She is starting to feel her breathing getting worse again. She has had to increase her home supp O2 up from 2>>3L/min. She reports full compliance w/ torsemide , taking 1 tablet twice daily. BP is well controlled at 119/67.     PMH: 1. Back and hip pain 2. HTN 3. Obesity 4. GERD 5. Hyperlipidemia 6. Depression 7. COPD: Has quit smoking. Now on home oxygen  2L [place].  PFTs (3/17) with severe COPD.  8. 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 Event monitor (12/14) with only NSR noted.  10. Chest pain: Atypical.  ETT-Cardiolite  (12/13) with EF 60%, no ischemia/infarction. Lexiscan  Cardiolite  (2/17) with small, moderate-intensity partially reversible inferolateral defect. LHC (3/17): No significant CAD. 11. Syncope: Uncertain etiology.   12. OHS/OSA: Cannot tolerate CPAP. Sleep study negative for OSA in 5/17.  13. Gout 14.  Migraines 15. Diabetes 16. Diastolic CHF: Echo (8/16) with EF 55-60%, grade II diastolic dysfunction.  - RHC (6/82) with mean RA 8, PA 45/20 mean 30, mean PCWP 20; PVR 1.5 WU.  - Echo (4/18): EF 45-50%, diffuse hypokinesis, grade II diastolic dysfunction, moderately dilated RV with moderately decreased systolic function. - Echo (4/20): EF 60-65%, mild RV enlargement with normal systolic function. Respirophasic  septal variation noted.   - RHC (6/21): mean RA 11, PA 44/14 mean 27, mean PCWP 15, CI 4.6, PVR 1.1.  - Echo (2/24): EF 55-60%, mild LVH, mildly D-shaped septum.   17. 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  18. Hypothyroidism  Current Outpatient Medications  Medication Sig Dispense Refill   Accu-Chek Softclix Lancets lancets Use to check blood sugar 3 times daily 100 each 12   acetaminophen  (TYLENOL ) 325 MG tablet Take 2 tablets (650 mg total) by mouth every 6 (six) hours as needed for mild pain (pain score 1-3) or fever (or Fever >/= 101).     albuterol  (VENTOLIN  HFA) 108 (90 Base) MCG/ACT inhaler Inhale 1-2 puffs into the lungs every 6 (six) hours as needed for wheezing or shortness of breath. 1 each 1   allopurinol  (ZYLOPRIM ) 100 MG tablet Take 1 tablet (100 mg total) by mouth daily. 90 tablet 3   aspirin  EC 81 MG tablet Take 1 tablet (81 mg total) by mouth daily. Swallow whole. 90 tablet 3   blood glucose meter kit and supplies KIT Dispense based on patient and insurance preference. Use up to four times daily as directed. 1 each 0   Blood Glucose Monitoring Suppl DEVI 1 each by Does not apply route in the morning, at noon, and at bedtime. May substitute to any manufacturer covered by patient's insurance. 1 each 0   budesonide  (PULMICORT ) 0.25 MG/2ML nebulizer solution Inhale 2 mLs (0.25 mg total) by nebulization 2 (two) times daily. 120 mL 0   busPIRone  (BUSPAR ) 30 MG tablet Take 1 tablet (30 mg total) by mouth 2 (two) times daily. 180 tablet 3   carvedilol  (COREG ) 6.25 MG tablet Take 1 tablet (6.25 mg total) by mouth 2 (two) times daily. 60 tablet 11   clotrimazole  (LOTRIMIN ) 1 % cream Apply topically 2 (two) times daily. 30 g 0   diclofenac  Sodium (VOLTAREN ) 1 % GEL Apply 4 g topically 4 (four) times daily. 120 g 0   dicyclomine  (BENTYL ) 20 MG tablet Take 1 tablet (20 mg total) by mouth 2 (two) times daily. 20 tablet 0   EPINEPHrine  0.3 mg/0.3 mL IJ SOAJ injection Inject 0.3  mg into the muscle once as needed for anaphylaxis.     fenofibrate  (TRICOR ) 145 MG tablet Take 1 tablet (145 mg total) by mouth daily. 30 tablet 11   FEROSUL 325 (65 Fe) MG tablet Take 1 tablet (325 mg total) by mouth daily. 30 tablet 11   gabapentin (NEURONTIN) 100 MG capsule Take 1 capsule 3 times a day by oral route as needed for 30 days.     glucose blood (ACCU-CHEK GUIDE TEST) test strip Use to check blood sugar 3 times daily 100 each 12   ipratropium-albuterol  (DUONEB) 0.5-2.5 (3) MG/3ML SOLN Take 3 mLs by nebulization every 4 (four) hours as needed. 360 mL 0   levocetirizine (XYZAL ) 5 MG tablet TAKE 1 TABLET (5 MG TOTAL) BY MOUTH AT BEDTIME. 30 tablet 0   levothyroxine  (SYNTHROID ) 125 MCG tablet Take 1 tablet (125 mcg total) by mouth at bedtime. 90 tablet 0  losartan  (COZAAR ) 25 MG tablet Take 1 tablet (25 mg total) by mouth daily. 30 tablet 11   lurasidone  (LATUDA ) 20 MG TABS tablet Take 1 tablet (20 mg total) by mouth at bedtime. Take with a snack. This is for mood and sleep. 90 tablet 3   nitroGLYCERIN  (NITROSTAT ) 0.4 MG SL tablet Place 1 tablet (0.4 mg total) under the tongue every 5 (five) minutes as needed for chest pain. 12 tablet 6   omeprazole  (PRILOSEC) 20 MG capsule Take 2 capsules (40 mg total) by mouth daily. 180 capsule 1   ondansetron  (ZOFRAN -ODT) 4 MG disintegrating tablet Take 1 tablet (4 mg total) by mouth every 8 (eight) hours as needed for nausea or vomiting. 20 tablet 0   OXYGEN  Inhale 2 L into the lungs continuous.     Rimegepant Sulfate (NURTEC) 75 MG TBDP Take 1 tablet (75 mg total) by mouth every other day. Prevents and Treats Migraine Headaches 45 tablet 3   rizatriptan (MAXALT) 10 MG tablet Take 1 tablet (10 mg total) by mouth as needed for migraine. May repeat in 2 hours if needed 10 tablet 0   rosuvastatin  (CRESTOR ) 10 MG tablet Take 1 tablet (10 mg total) by mouth every evening. 30 tablet 11   spironolactone  (ALDACTONE ) 25 MG tablet Take 0.5 tablets (12.5 mg  total) by mouth daily. 15 tablet 0   torsemide  (DEMADEX ) 20 MG tablet Take 2 tablets (40 mg total) by mouth 2 (two) times daily. 120 tablet 0   umeclidinium-vilanterol (ANORO ELLIPTA ) 62.5-25 MCG/ACT AEPB Inhale 1 puff into the lungs daily. 98 each 0   Vitamin D , Ergocalciferol , (DRISDOL ) 1.25 MG (50000 UNIT) CAPS capsule Take 1 capsule (50,000 Units total) by mouth every 7 (seven) days. 12 capsule 0   No current facility-administered medications for this encounter.   Allergies  Allergen Reactions   Bee Venom Anaphylaxis    All over my body (swelling)   Fioricet  [Butalbital -Apap-Caffeine ] Nausea And Vomiting and Rash   Ibuprofen  Rash and Other (See Comments)    Severe rash   Lamisil  [Terbinafine ] Rash and Other (See Comments)    Pt states this causes her to feel funny   Nsaids Other (See Comments)    Per MD's orders    Social History   Socioeconomic History   Marital status: Single    Spouse name: Not on file   Number of children: 1   Years of education: Not on file   Highest education level: Not on file  Occupational History   Occupation: disabled  Tobacco Use   Smoking status: Former    Current packs/day: 0.00    Average packs/day: 0.5 packs/day for 41.0 years (20.5 ttl pk-yrs)    Types: Cigarettes    Start date: 07/1975    Quit date: 07/2016    Years since quitting: 7.6   Smokeless tobacco: Never   Tobacco comments:    Pt stop smoking 07/04/2011  Vaping Use   Vaping status: Never Used  Substance and Sexual Activity   Alcohol use: Not Currently    Comment: quit 12 years ago   Drug use: Not Currently    Types: Marijuana, Cocaine    Comment: quit 12 years ago   Sexual activity: Yes  Other Topics Concern   Not on file  Social History Narrative   Not on file   Social Drivers of Health   Financial Resource Strain: Low Risk  (10/24/2022)   Overall Financial Resource Strain (CARDIA)    Difficulty of Paying Living  Expenses: Not very hard  Food Insecurity: No  Food Insecurity (01/22/2024)   Hunger Vital Sign    Worried About Running Out of Food in the Last Year: Never true    Ran Out of Food in the Last Year: Never true  Transportation Needs: No Transportation Needs (01/22/2024)   PRAPARE - Administrator, Civil Service (Medical): No    Lack of Transportation (Non-Medical): No  Physical Activity: Inactive (02/01/2021)   Exercise Vital Sign    Days of Exercise per Week: 0 days    Minutes of Exercise per Session: 0 min  Stress: Stress Concern Present (02/23/2021)   [employer] of Occupational Health - Occupational Stress Questionnaire    Feeling of Stress : To some extent  Social Connections: Moderately Isolated (01/22/2024)   Social Connection and Isolation Panel    Frequency of Communication with Friends and Family: More than three times a week    Frequency of Social Gatherings with Friends and Family: More than three times a week    Attends Religious Services: 1 to 4 times per year    Active Member of [employer] or Organizations: No    Attends Banker Meetings: Never    Marital Status: Divorced  Catering Manager Violence: Not At Risk (01/22/2024)   Humiliation, Afraid, Rape, and Kick questionnaire    Fear of Current or Ex-Partner: No    Emotionally Abused: No    Physically Abused: No    Sexually Abused: No   Family History  Problem Relation Age of Onset   Cancer Father        thought to be due to exposure to concrete   Diabetes Mother    Thyroid  cancer Mother        dx in her 52s-60s   Cancer Maternal Uncle        2 uncles with cancer NOS   Brain cancer Paternal Aunt    Cancer Cousin        maternal first cousin - NOS   Cancer Cousin        maternal first cousin - NOS   Wt Readings from Last 3 Encounters:  02/13/24 (!) 136.9 kg (301 lb 12.8 oz)  02/07/24 (!) 153.3 kg (338 lb)  01/22/24 (!) 148.1 kg (326 lb 8 oz)   BP 119/67   Pulse 73   Ht 5' 5 (1.651 m)   Wt (!) 136.9 kg (301 lb 12.8 oz)    SpO2 90%   BMI 50.22 kg/m   PHYSICAL EXAM: GENERAL: super morbidly obese, NAD Lungs- diminished at the bases  CARDIAC:  thick neck, JVD not well visualized          Normal rate with regular rhythm. Trace b/l pretibial edema  ABDOMEN: obese, soft, non-tender, non-distended.  EXTREMITIES: Warm and well perfused.  NEUROLOGIC: No obvious FND   ECG: not performed   ASSESSMENT & PLAN: 1. Chronic diastolic CHF: Has had multiple prior admissions with CHF and COPD exacerbations.  Echo 05/2022 with EF 55-60%, D-shaped septum, Grade IDD.  Recent echo 10/25 showed EF 55-60%, RV mod enlarged, interventricular septum is flattened in systole and diastole, consistent with right ventricular pressure and volume overload. RV systolic fx noted to be normal. - NYHA Class IIIb confounded by super morbid obesity, deconditioning and arthritis  - w/ increased O2 requirements since d/c from hospital w/ evidence of mild LEE on exam, though full assessment regarding the extensiveness of volume overload is extremely difficult given overall body habitus -  increase torsemide  to 40 mg bid - keep off Farxiga  given h/o GU infections/limited mobility  - d/w Dr. Rolan. Will plan for outpatient RHC to guide further diuretic dosing  - Continue losartan  25 mg daily.  - Continue spironolactone  25 mg daily.  - I reviewed recent hospital labs and SCr/K stable. Will repeat BMP in 1 wk w/ torsemide  increase  - Cardiomems would be beneficial given multiple admissions for CHF and difficult exam. Unfortunately she has Medicaid and they will not cover. 2. HTN: controlled on current regimen - no change  3. Obesity: Body mass index is 50.22 kg/m. - She is interested in GLP1i. Will refer to pharmD  4. Chronic Hypoxemic Respiratory Failure: Suspect due to COPD and OHS. She is on 2L home oxygen  but recently increased to 3L. Suspect volume overload, see plan above   - Should follow with pulmonary.  5. SVT: Episode in 2/25, required 6  mg of adensine to break. 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 - Continue Coreg  to 6.25 mg bid.     F/u w/ APP after RHC   Norfleet Capers, PA-C 02/13/2024

## 2024-02-18 NOTE — Telephone Encounter (Signed)
 Form for [employer] was signed by Dr. Sebastian MD and sent with clinical nurse visit with confirmation.

## 2024-02-20 ENCOUNTER — Ambulatory Visit (HOSPITAL_COMMUNITY)

## 2024-02-21 ENCOUNTER — Telehealth (HOSPITAL_COMMUNITY): Payer: Self-pay | Admitting: Licensed Clinical Social Worker

## 2024-02-21 ENCOUNTER — Ambulatory Visit (HOSPITAL_COMMUNITY): Payer: Self-pay | Admitting: Cardiology

## 2024-02-21 ENCOUNTER — Ambulatory Visit (HOSPITAL_COMMUNITY)
Admission: RE | Admit: 2024-02-21 | Discharge: 2024-02-21 | Disposition: A | Discharge status: Home / Self Care | Source: Ambulatory Visit | Attending: Internal Medicine | Admitting: Internal Medicine

## 2024-02-21 DIAGNOSIS — I5032 Chronic diastolic (congestive) heart failure: Secondary | ICD-10-CM | POA: Diagnosis present

## 2024-02-21 LAB — BASIC METABOLIC PANEL WITH GFR
Anion gap: 11 (ref 5–15)
BUN: 16 mg/dL (ref 8–23)
CO2: 36 mmol/L — ABNORMAL HIGH (ref 22–32)
Calcium: 8.2 mg/dL — ABNORMAL LOW (ref 8.9–10.3)
Chloride: 91 mmol/L — ABNORMAL LOW (ref 98–111)
Creatinine, Ser: 0.93 mg/dL (ref 0.44–1.00)
GFR, Estimated: >60 mL/min (ref >60)
Glucose, Bld: 116 mg/dL — ABNORMAL HIGH (ref 70–99)
Potassium: 4.5 mmol/L (ref 3.5–5.1)
Sodium: 138 mmol/L (ref 135–145)

## 2024-02-21 LAB — BRAIN NATRIURETIC PEPTIDE: B Natriuretic Peptide: 24.4 pg/mL (ref 0.0–100.0)

## 2024-02-21 NOTE — Telephone Encounter (Signed)
 Pt came in for labs and asked staff about LTC and wanting some assistance in starting this process.  CSW referring to [facility] to discuss further with patient and assist as needed  Andriette HILARIO Leech, LCSW Clinical Social Worker [facility] Desk#: [phone] Cell#: [phone]

## 2024-02-29 ENCOUNTER — Telehealth (HOSPITAL_COMMUNITY): Payer: Self-pay | Admitting: Licensed Clinical Social Worker

## 2024-02-29 ENCOUNTER — Telehealth: Payer: Self-pay

## 2024-02-29 NOTE — Progress Notes (Signed)
 Complex Care Management Note Care Guide Note  02/29/2024 Name: Michele Owens MRN: [id number] DOB: [birth year]   Complex Care Management Outreach Attempts: An unsuccessful telephone outreach was attempted today to offer the patient information about available complex care management services.  Follow Up Plan:  Additional outreach attempts will be made to offer the patient complex care management information and services.   Encounter Outcome:  Patient Request to Call Back  [name]  [facility], [facility] Assistant Direct Dial: [phone]  Fax: [phone]

## 2024-02-29 NOTE — Telephone Encounter (Signed)
 CSW informed pt needs transportation to heart cath on Monday.  Attempted to call x2- left VM.  Will continue to attempt.  Andriette HILARIO Leech, LCSW Clinical Social Worker [facility] Desk#: [phone] Cell#: [phone]

## 2024-02-29 NOTE — Progress Notes (Signed)
 Complex Care Management Note  Care Guide Note 02/29/2024 Name: Margit Batte MRN: [id number] DOB: [birth year]  Areyana Leoni is a 63 y.o. year old female who sees [name], MD for primary care. I reached out to [name] by phone today to offer complex care management services.  Ms. Linzy was given information about Complex Care Management services today including:   The Complex Care Management services include support from the care team which includes your Nurse Care Manager, Clinical Social Worker, or Pharmacist.  The Complex Care Management team is here to help remove barriers to the health concerns and goals most important to you. Complex Care Management services are voluntary, and the patient may decline or stop services at any time by request to their care team member.   Complex Care Management Consent Status: Patient wishes to consider information provided and/or speak with a member of the care team before deciding to participate in complex care management services.   Follow up plan:  The care guide will reach out to the patient again over the next 7 days.  Encounter Outcome:  Patient Request to Call Back  [name]  [facility], [facility] VBCI Assistant Direct Dial: [phone]  Fax: [phone]

## 2024-02-29 NOTE — Progress Notes (Deleted)
 NEUROLOGY CONSULTATION NOTE  Michele Owens MRN: [id number] DOB: 1960-10-05  Referring provider: Beverley KATHEE Hummer, MD Primary care provider: Beverley KATHEE Hummer, MD  Reason for consult:  headache  Assessment/Plan:   ***   Subjective:  Michele Owens is a 63 year old ***-handed female with CAD, CHF, COPD, HTN, DM 2 with neuropathy, CKD stage 3, OSA, asthma, arthritis, Bipolar disorder, MDD, GAD and history of DVT and thyroid  cancer who presents for headache.  History supplemented by referring provider's note.  Onset:  *** Location:  *** Quality:  *** Intensity:  ***.  Aura:  *** Prodrome:  *** Postdrome:  *** Associated symptoms:  ***.  *** denies associated unilateral numbness or weakness. Duration:  *** Frequency:  *** Frequency of abortive medication: *** Triggers:  *** Relieving factors:  *** Activity:  ***  ***.  Seen in the ED in September for intractable migraines. CT head unremarkable  Admitted to the hospital in October for acute on chronic CHF presenting with dyspnea.     Past NSAIDS/analgesics:  Fioricet  (induced nausea and vomiting), naproxen, tramadol , oxycodone  Past abortive triptans:  *** Past abortive ergotamine:  *** Past muscle relaxants:  cyclobenzaprine , methocarbamol  Past anti-emetic:  metoclopramide  Past antihypertensive medications:  metoprolol , lisinopril , furosemide  Past antidepressant medications:  mirtazapine , trazodone  (insomnia-induced nausea), doxepin  Past anticonvulsant medications:  *** Past anti-CGRP:  Nurtec every other day *** Past vitamins/Herbal/Supplements:  *** Past antihistamines/decongestants:  loratadine  Other past therapies:  ***  Current NSAIDS/analgesics:  ASA 81mg  daily, acetaminophen  Current triptans:  rizatriptan  10mg  Current ergotamine:  none Current anti-emetic:  ondansetron  ODT 4mg  Current muscle relaxants:  none Current Antihypertensive medications:  carvedilol , losartan , spironolactone  Current  Antidepressant medications:  none Current Anticonvulsant medications:  gabapentin  100mg  TID PRN Current anti-CGRP:  *** Current Vitamins/Herbal/Supplements:  D, Ferosul Current Antihistamines/Decongestants:  levocetirizine Other medications:  lurasidone , buspirone , levothyroxine , torsemide , allopurinol , NTG   Caffeine :  *** Alcohol:  *** Smoker:  *** Diet:  *** Exercise:  *** Depression:  ***; Anxiety:  *** Other pain:  *** Sleep hygiene:  ***  History of TBI/concussion:  *** Family history of headache:  *** Family history of cerebral aneurysm:  ***      PAST MEDICAL HISTORY: Past Medical History:  Diagnosis Date   Anxiety    Arthritis    Asthma    Chronic diastolic CHF (congestive heart failure) (HCC)    COPD (chronic obstructive pulmonary disease) (HCC)    Uses Oxygen  at night   Depression    Diabetes mellitus without complication (HCC)    GERD (gastroesophageal reflux disease)    Gout    Headache    migraines   History of DVT of lower extremity    Hypertension    Morbid obesity with BMI of 45.0-49.9, adult (HCC)    Thyroid  cancer (HCC) 09/23/2016    PAST SURGICAL HISTORY: Past Surgical History:  Procedure Laterality Date   BUNIONECTOMY Bilateral    CARDIAC CATHETERIZATION N/A 06/23/2015   Procedure: Right/Left Heart Cath and Coronary Angiography;  Surgeon: Ezra GORMAN Shuck, MD;  Location: [facility] INVASIVE CV LAB;  Service: Cardiovascular;  Laterality: N/A;   COLONOSCOPY WITH PROPOFOL  N/A 12/15/2015   Procedure: COLONOSCOPY WITH PROPOFOL ;  Surgeon: Norleen Hint, MD;  Location: [facility] ENDOSCOPY;  Service: Endoscopy;  Laterality: N/A;   RIGHT HEART CATH N/A 10/01/2019   Procedure: RIGHT HEART CATH;  Surgeon: Shuck Ezra GORMAN, MD;  Location: [facility] INVASIVE CV LAB;  Service: Cardiovascular;  Laterality: N/A;   THYROIDECTOMY  09/19/2016  THYROIDECTOMY N/A 09/19/2016   Procedure: TOTAL THYROIDECTOMY;  Surgeon: Eletha Boas, MD;  Location: [facility] OR;  Service: General;  Laterality: N/A;    TONSILLECTOMY     TOTAL ABDOMINAL HYSTERECTOMY  07/14/10    MEDICATIONS: Current Outpatient Medications on File Prior to Visit  Medication Sig Dispense Refill   Accu-Chek Softclix Lancets lancets Use to check blood sugar 3 times daily 100 each 12   acetaminophen  (TYLENOL ) 325 MG tablet Take 2 tablets (650 mg total) by mouth every 6 (six) hours as needed for mild pain (pain score 1-3) or fever (or Fever >/= 101).     albuterol  (VENTOLIN  HFA) 108 (90 Base) MCG/ACT inhaler Inhale 1-2 puffs into the lungs every 6 (six) hours as needed for wheezing or shortness of breath. 1 each 1   allopurinol  (ZYLOPRIM ) 100 MG tablet Take 1 tablet (100 mg total) by mouth daily. 90 tablet 3   aspirin  EC 81 MG tablet Take 1 tablet (81 mg total) by mouth daily. Swallow whole. 90 tablet 3   blood glucose meter kit and supplies KIT Dispense based on patient and insurance preference. Use up to four times daily as directed. 1 each 0   Blood Glucose Monitoring Suppl DEVI 1 each by Does not apply route in the morning, at noon, and at bedtime. May substitute to any manufacturer covered by patient's insurance. 1 each 0   budesonide  (PULMICORT ) 0.25 MG/2ML nebulizer solution Inhale 2 mLs (0.25 mg total) by nebulization 2 (two) times daily. 120 mL 0   busPIRone  (BUSPAR ) 30 MG tablet Take 1 tablet (30 mg total) by mouth 2 (two) times daily. 180 tablet 3   carvedilol  (COREG ) 6.25 MG tablet Take 1 tablet (6.25 mg total) by mouth 2 (two) times daily. 60 tablet 11   clotrimazole  (LOTRIMIN ) 1 % cream Apply topically 2 (two) times daily. 30 g 0   diclofenac  Sodium (VOLTAREN ) 1 % GEL Apply 4 g topically 4 (four) times daily. 120 g 0   dicyclomine  (BENTYL ) 20 MG tablet Take 1 tablet (20 mg total) by mouth 2 (two) times daily. 20 tablet 0   EPINEPHrine  0.3 mg/0.3 mL IJ SOAJ injection Inject 0.3 mg into the muscle once as needed for anaphylaxis.     fenofibrate  (TRICOR ) 145 MG tablet Take 1 tablet (145 mg total) by mouth daily. 30 tablet  11   FEROSUL 325 (65 Fe) MG tablet Take 1 tablet (325 mg total) by mouth daily. 30 tablet 11   gabapentin  (NEURONTIN ) 100 MG capsule Take 1 capsule 3 times a day by oral route as needed for 30 days.     glucose blood (ACCU-CHEK GUIDE TEST) test strip Use to check blood sugar 3 times daily 100 each 12   ipratropium-albuterol  (DUONEB) 0.5-2.5 (3) MG/3ML SOLN Take 3 mLs by nebulization every 4 (four) hours as needed. 360 mL 0   levocetirizine (XYZAL ) 5 MG tablet TAKE 1 TABLET (5 MG TOTAL) BY MOUTH AT BEDTIME. 30 tablet 0   levothyroxine  (SYNTHROID ) 125 MCG tablet Take 1 tablet (125 mcg total) by mouth at bedtime. 90 tablet 0   losartan  (COZAAR ) 25 MG tablet Take 1 tablet (25 mg total) by mouth daily. 30 tablet 11   lurasidone  (LATUDA ) 20 MG TABS tablet Take 1 tablet (20 mg total) by mouth at bedtime. Take with a snack. This is for mood and sleep. 90 tablet 3   nitroGLYCERIN  (NITROSTAT ) 0.4 MG SL tablet Place 1 tablet (0.4 mg total) under the tongue every 5 (five) minutes  as needed for chest pain. 12 tablet 6   omeprazole  (PRILOSEC) 20 MG capsule Take 2 capsules (40 mg total) by mouth daily. 180 capsule 1   ondansetron  (ZOFRAN -ODT) 4 MG disintegrating tablet Take 1 tablet (4 mg total) by mouth every 8 (eight) hours as needed for nausea or vomiting. 20 tablet 0   OXYGEN  Inhale 2 L into the lungs continuous.     Rimegepant Sulfate (NURTEC) 75 MG TBDP Take 1 tablet (75 mg total) by mouth every other day. Prevents and Treats Migraine Headaches 45 tablet 3   rizatriptan  (MAXALT ) 10 MG tablet Take 1 tablet (10 mg total) by mouth as needed for migraine. May repeat in 2 hours if needed 10 tablet 0   rosuvastatin  (CRESTOR ) 10 MG tablet Take 1 tablet (10 mg total) by mouth every evening. 30 tablet 11   spironolactone  (ALDACTONE ) 25 MG tablet Take 0.5 tablets (12.5 mg total) by mouth daily. 15 tablet 0   torsemide  (DEMADEX ) 20 MG tablet Take 2 tablets (40 mg total) by mouth 2 (two) times daily. 120 tablet 0    umeclidinium-vilanterol (ANORO ELLIPTA ) 62.5-25 MCG/ACT AEPB Inhale 1 puff into the lungs daily. 98 each 0   Vitamin D , Ergocalciferol , (DRISDOL ) 1.25 MG (50000 UNIT) CAPS capsule Take 1 capsule (50,000 Units total) by mouth every 7 (seven) days. 12 capsule 0   No current facility-administered medications on file prior to visit.    ALLERGIES: Allergies  Allergen Reactions   Bee Venom Anaphylaxis    All over my body (swelling)   Fioricet  [Butalbital -Apap-Caffeine ] Nausea And Vomiting and Rash   Ibuprofen  Rash and Other (See Comments)    Severe rash   Lamisil  [Terbinafine ] Rash and Other (See Comments)    Pt states this causes her to feel funny   Nsaids Other (See Comments)    Per MD's orders     FAMILY HISTORY: Family History  Problem Relation Age of Onset   Cancer Father        thought to be due to exposure to concrete   Diabetes Mother    Thyroid  cancer Mother        dx in her 67s-60s   Cancer Maternal Uncle        2 uncles with cancer NOS   Brain cancer Paternal Aunt    Cancer Cousin        maternal first cousin - NOS   Cancer Cousin        maternal first cousin - NOS    Objective:  *** General: No acute distress.  Patient appears well-groomed.   Head:  Normocephalic/atraumatic Eyes:  fundi examined but not visualized Neck: supple, no paraspinal tenderness, full range of motion Heart: regular rate and rhythm Neurological Exam: Mental status: alert and oriented to person, place, and time, speech fluent and not dysarthric, language intact. Cranial nerves: CN I: not tested CN II: pupils equal, round and reactive to light, visual fields intact CN III, IV, VI:  full range of motion, no nystagmus, no ptosis CN V: facial sensation intact. CN VII: upper and lower face symmetric CN VIII: hearing intact CN IX, X: gag intact, uvula midline CN XI: sternocleidomastoid and trapezius muscles intact CN XII: tongue midline Bulk & Tone: normal, no fasciculations. Motor:   muscle strength 5/5 throughout Sensation:  Pinprick and vibratory sensation intact. Deep Tendon Reflexes:  2+ throughout,  toes downgoing.   Finger to nose testing:  Without dysmetria.   Gait:  Normal station and stride.  Romberg negative.  Thank you for allowing me to take part in the care of this patient.  Juliene Dunnings, DO  CC: ***

## 2024-02-29 NOTE — Progress Notes (Signed)
 Complex Care Management Note Care Guide Note  02/29/2024 Name: Michele Owens MRN: [id number] DOB: 08/24/1960   Complex Care Management Outreach Attempts: A second unsuccessful outreach was attempted today to offer the patient with information about available complex care management services.  Follow Up Plan:  Additional outreach attempts will be made to offer the patient complex care management information and services.   Encounter Outcome:  Patient Request to Call Back [name]  [facility], [facility] VBCI Assistant Direct Dial: [phone]  Fax: [phone]

## 2024-02-29 NOTE — Telephone Encounter (Signed)
 H&V Care Navigation CSW Progress Note  Clinical Social Worker spoke with pt to get transport for cath on Monday.  Pt utilizes wheelchair and per RN staff would be unsafe to transfer in and out of taxi on her own.    CSW arranged [facility] [employer] to pick up pt from home at 9am with wheelchair fleeta as patient will be in wheelchair and carrying oxygen .  CSW will follow patient to assist with getting ride home when she is cleared.  Will continue to follow and assist as needed  Michele HILARIO Leech, LCSW Clinical Social Worker [facility] Desk#: [phone] Cell#: [phone]

## 2024-03-01 IMAGING — CR DG CHEST 2V
2 series · 2 of 2 positions shown · non-contrast
Comparison: Radiograph 03/16/2021, CT 03/09/2021

CLINICAL DATA: Midline to right-sided chest pain.

EXAM:
CHEST - 2 VIEW

[chest pa]
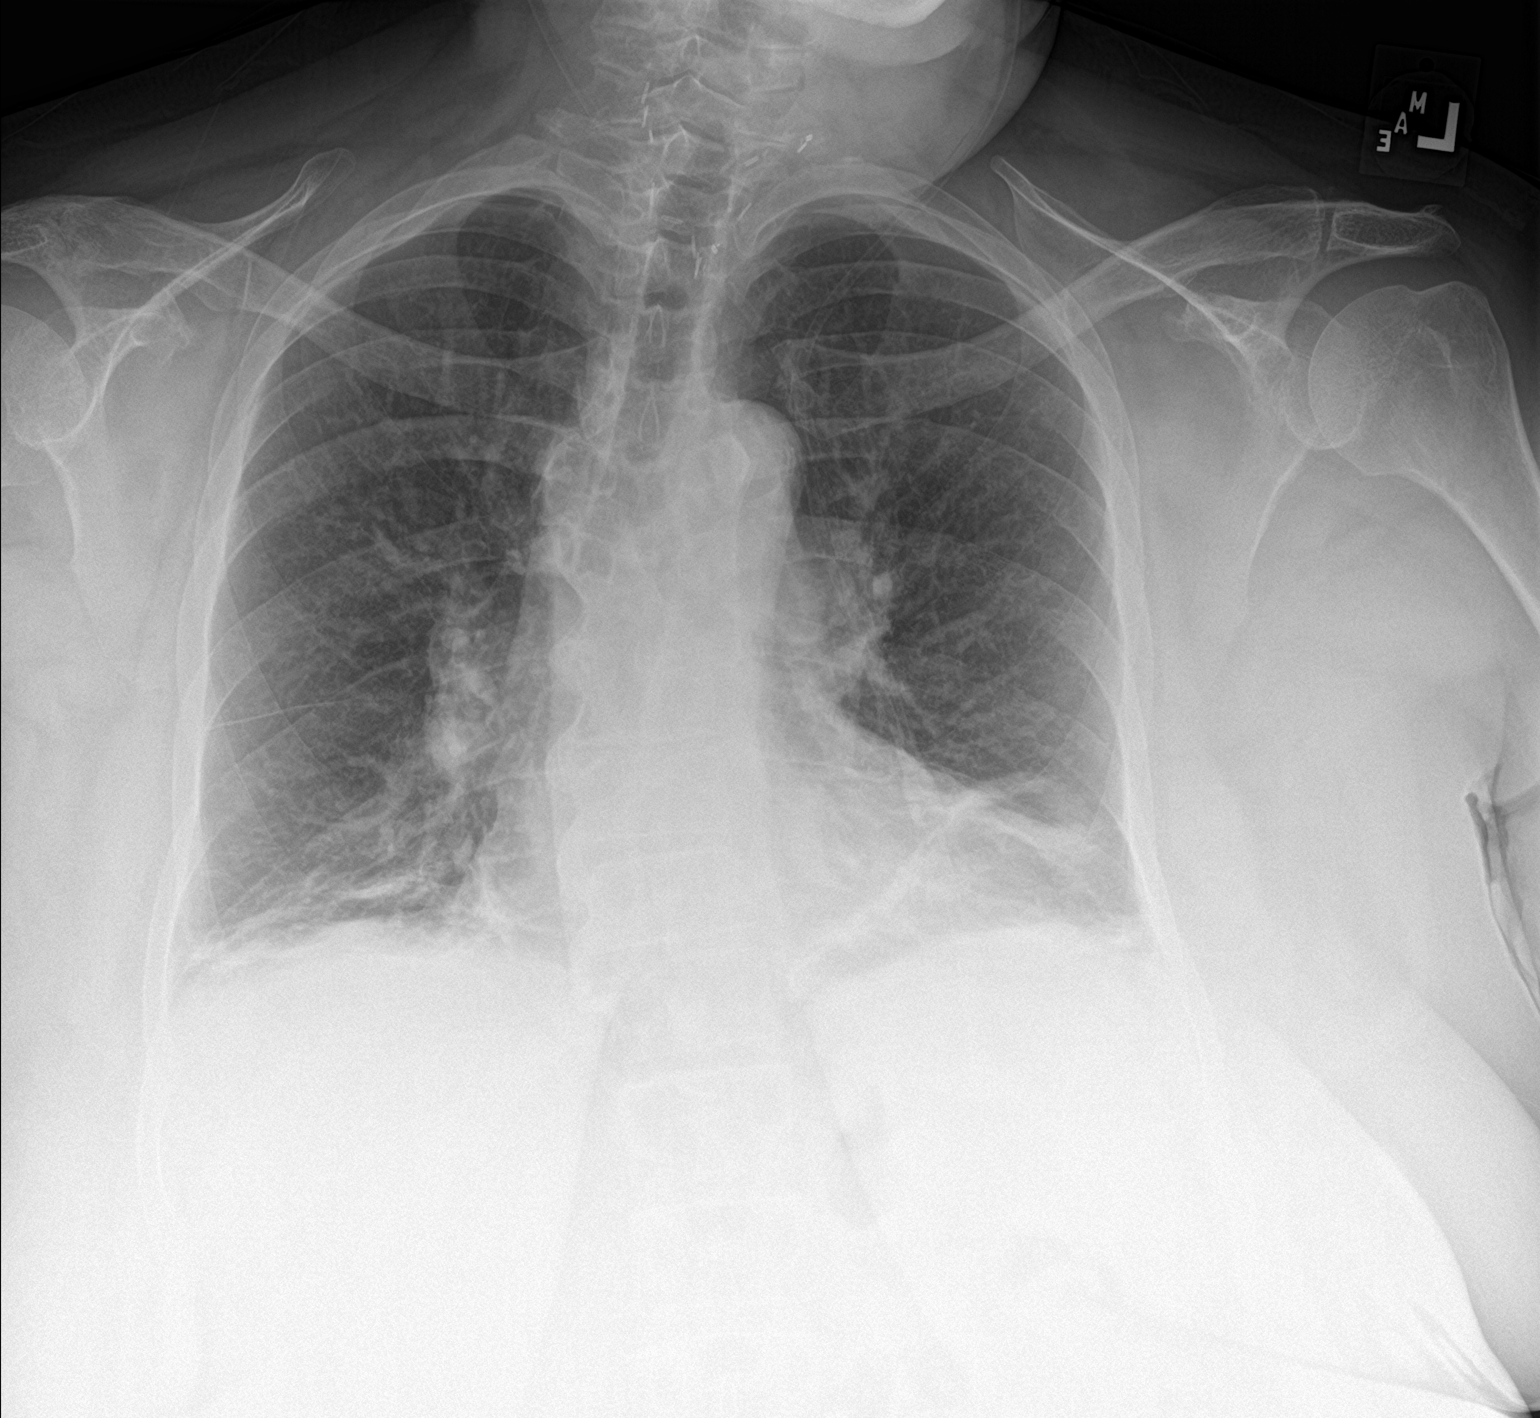

[chest lat]
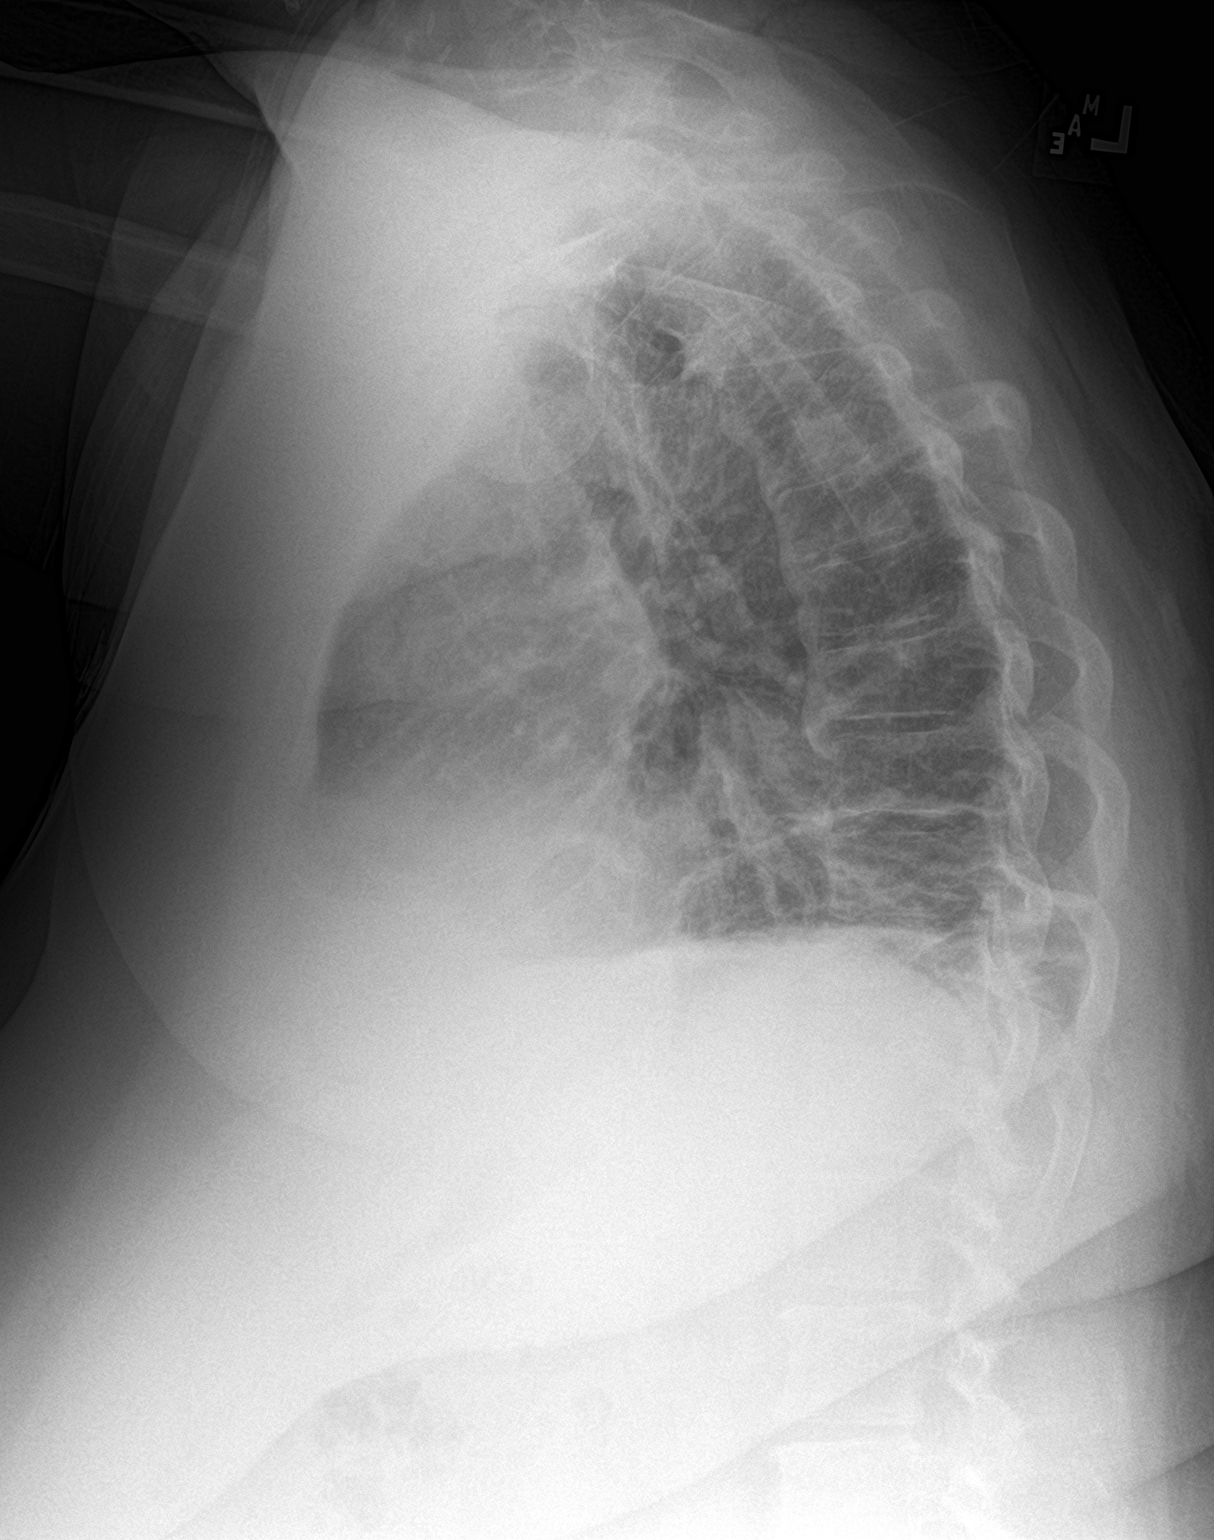

[2 of 2 positions shown; findings below may reference images not displayed]

FINDINGS: Stable heart size and mediastinal contours. Aortic atherosclerosis.
Streaky bibasilar atelectasis or scarring, chronic and unchanged
from prior. No acute airspace disease. No pulmonary edema. No
pleural effusion. No pneumothorax. Thoracic spondylosis.
IMPRESSION: No acute chest findings. Chronic bibasilar atelectasis or scarring.

## 2024-03-01 IMAGING — CR DG SHOULDER 2+V*R*
3 series · 3 of 3 positions shown · non-contrast
Comparison: None.

CLINICAL DATA: Fall, shoulder pain

EXAM:
RIGHT SHOULDER - 2+ VIEW

[shoulder ap neutral]
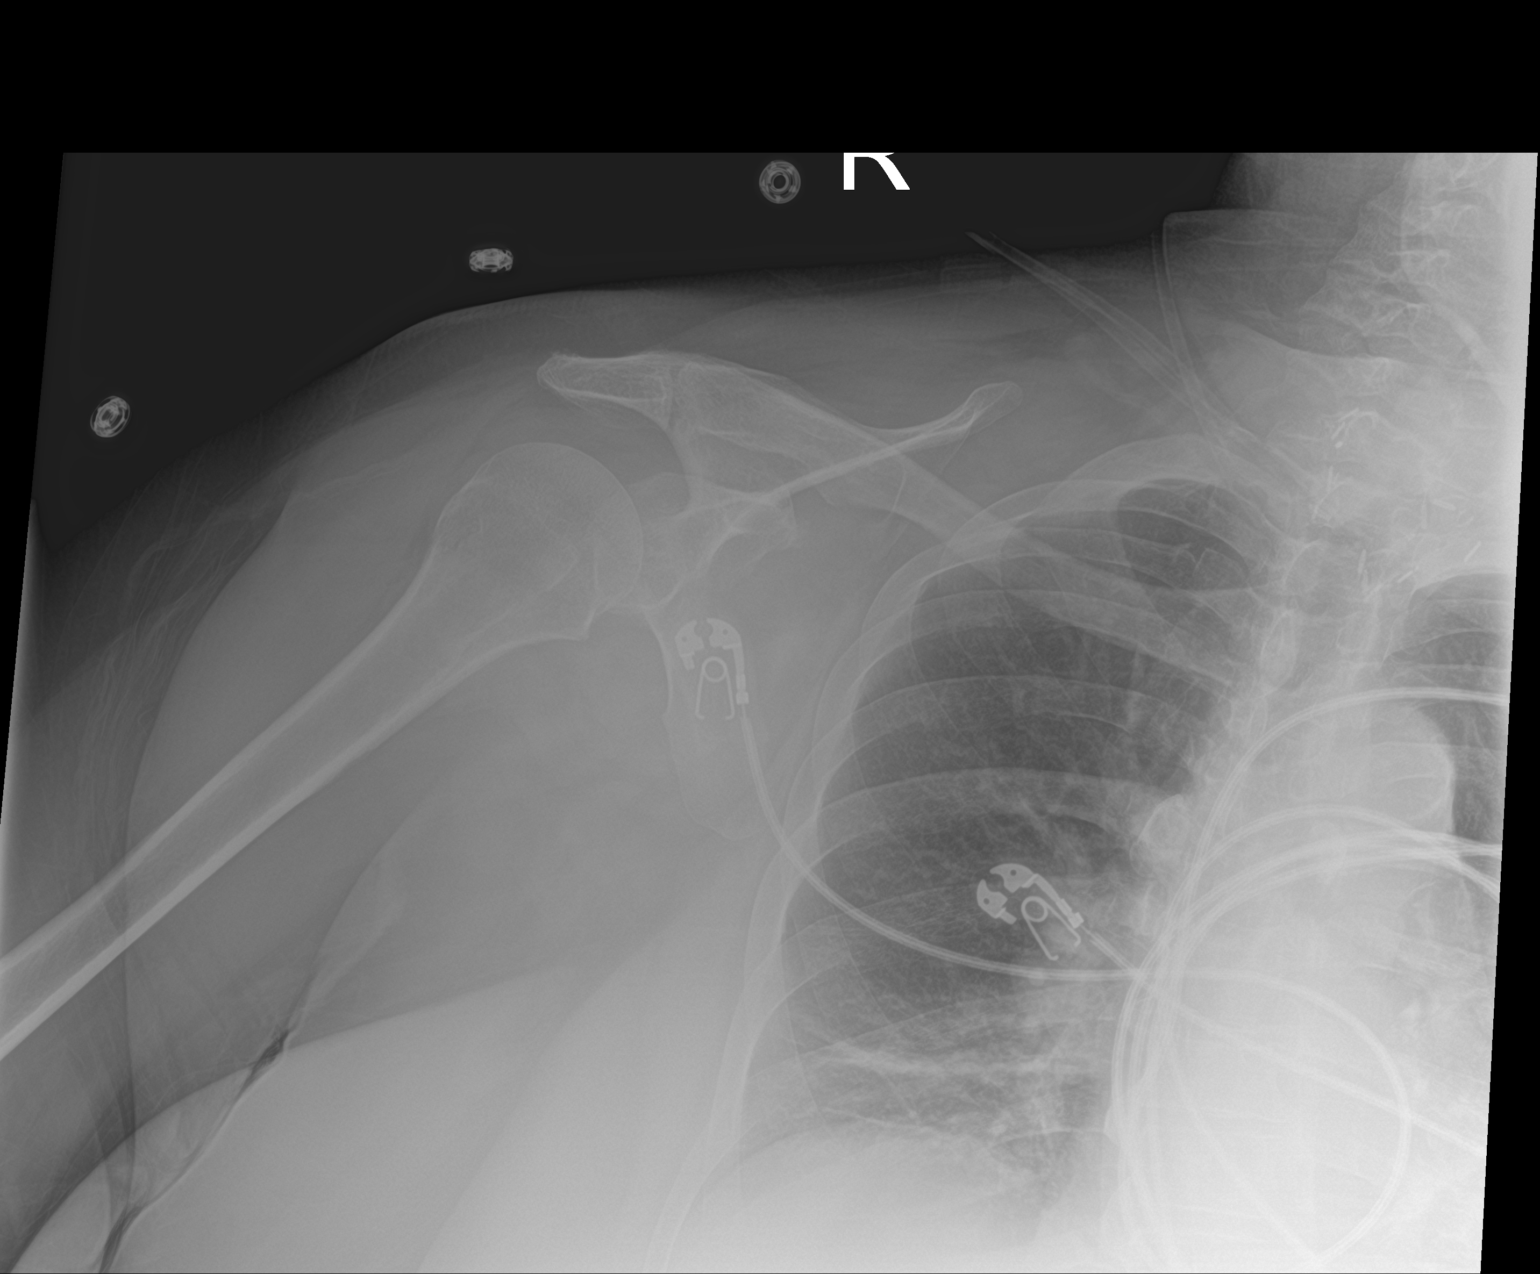

[shoulder y view]
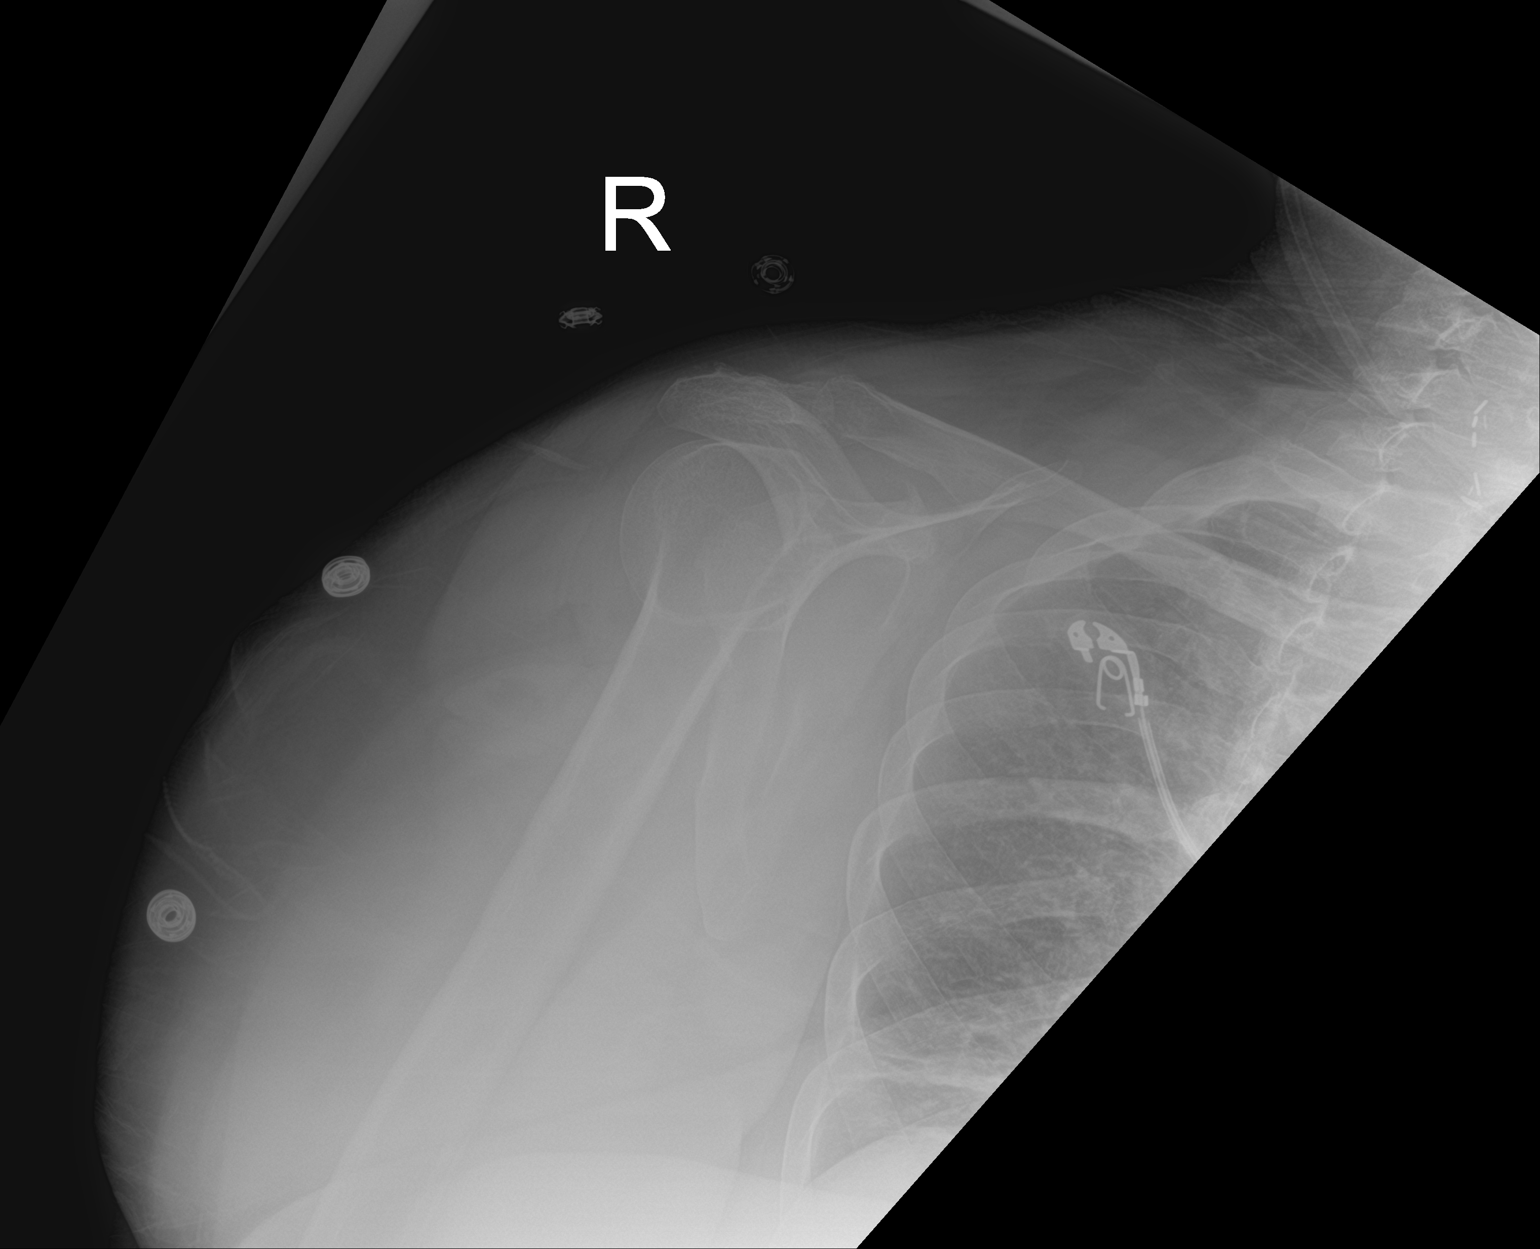

[shoulder grashey]
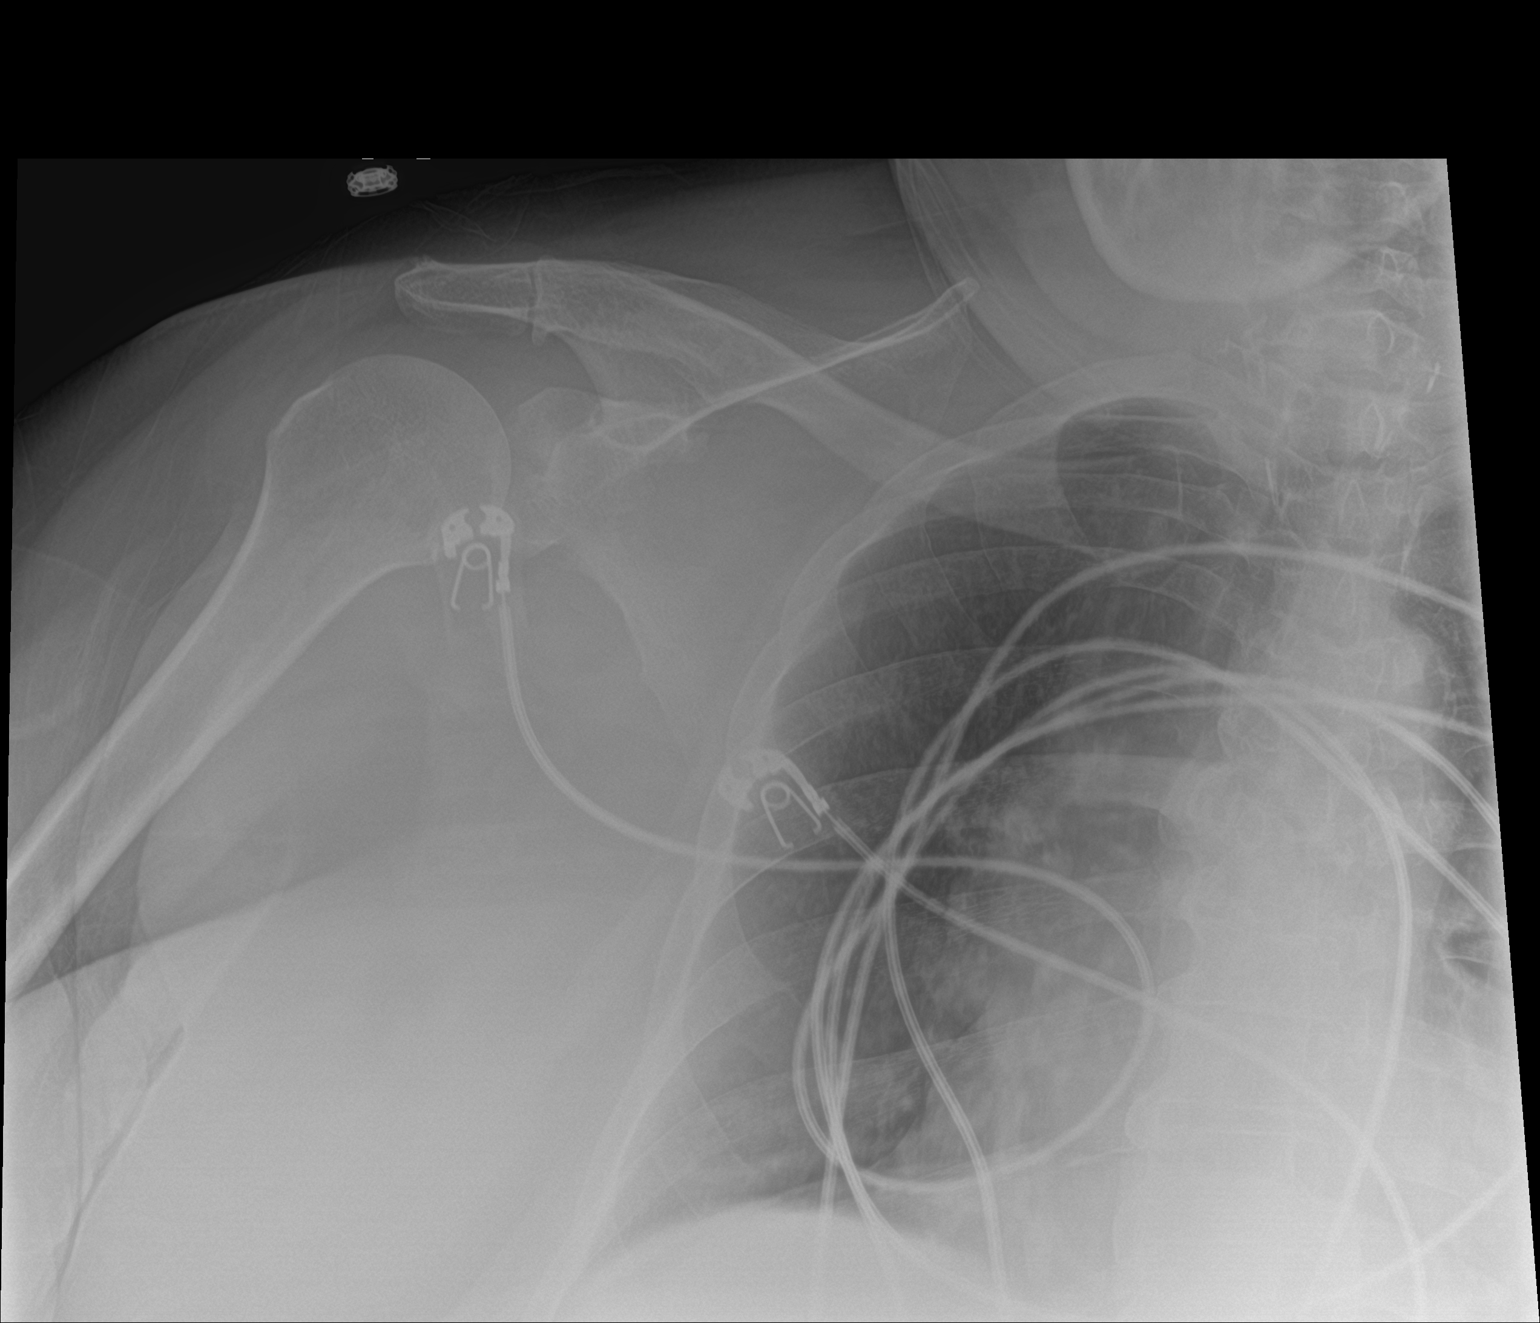

[3 of 3 positions shown; findings below may reference images not displayed]

FINDINGS: No fracture or dislocation is seen.

The joint spaces are preserved.

Visualized soft tissues are within normal limits.

Visualized right lung is clear.
IMPRESSION: Negative.

## 2024-03-01 IMAGING — CR DG THORACIC SPINE 2V
2 series · 2 of 2 positions shown · non-contrast
Comparison: None.

CLINICAL DATA: Fall, back pain

EXAM:
THORACIC SPINE 2 VIEWS

[t-spine ap]
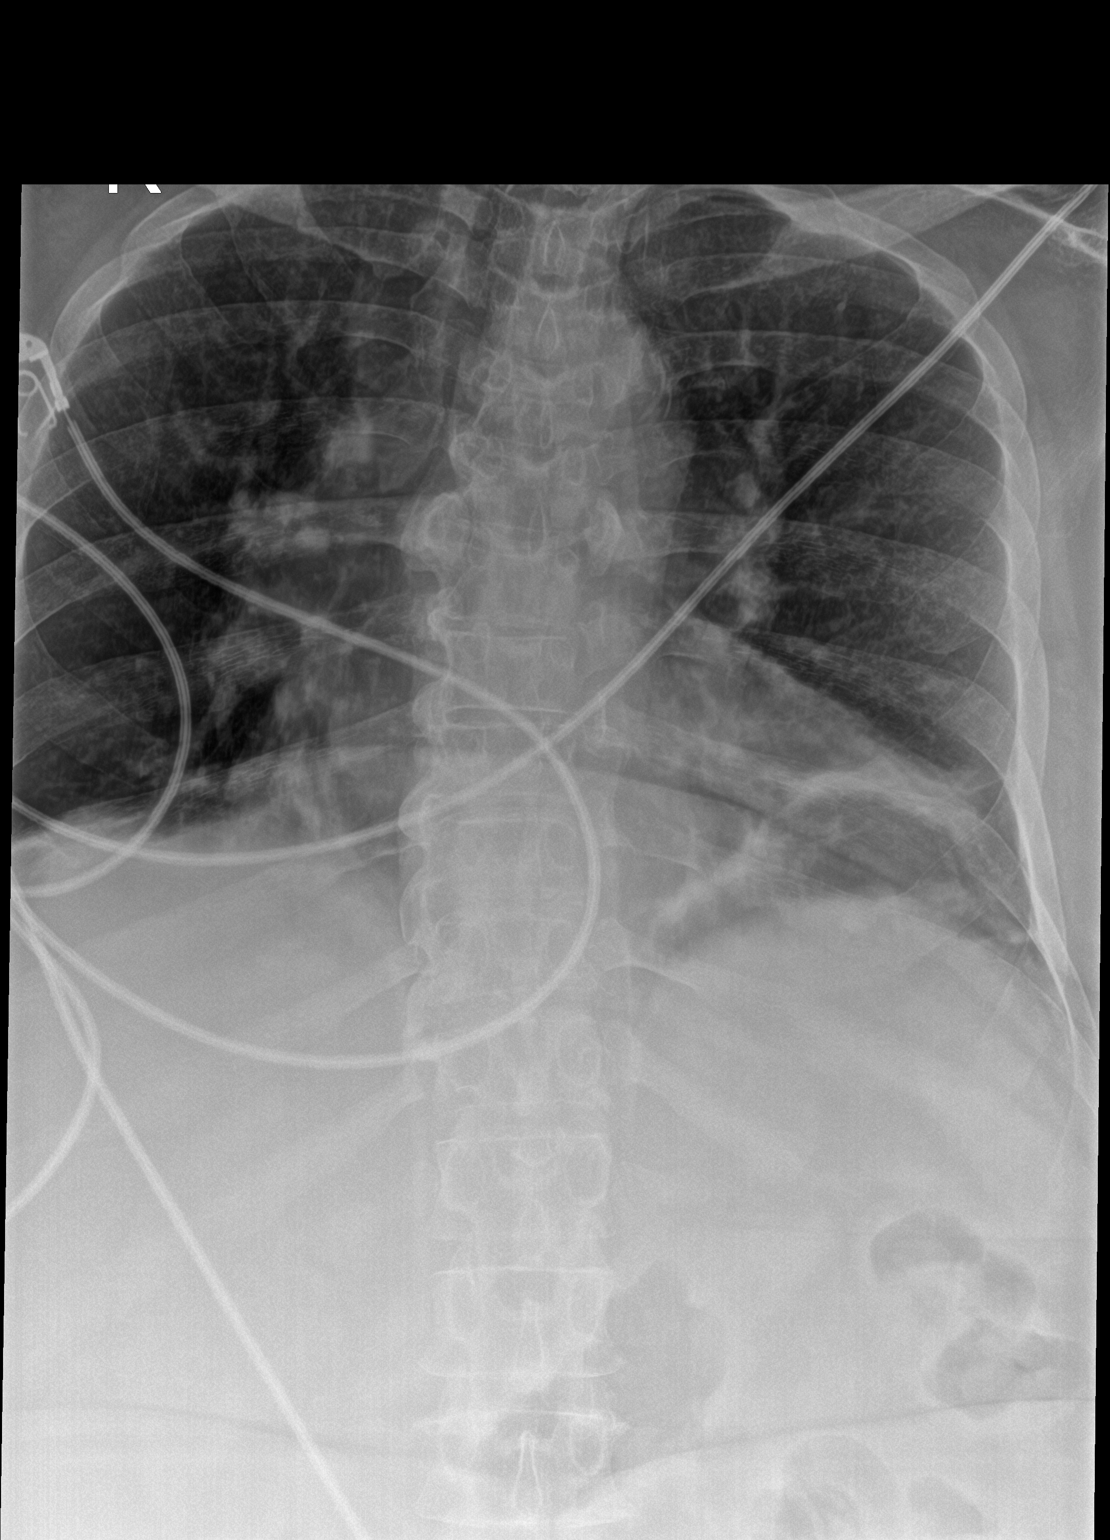

[t-spine lat]
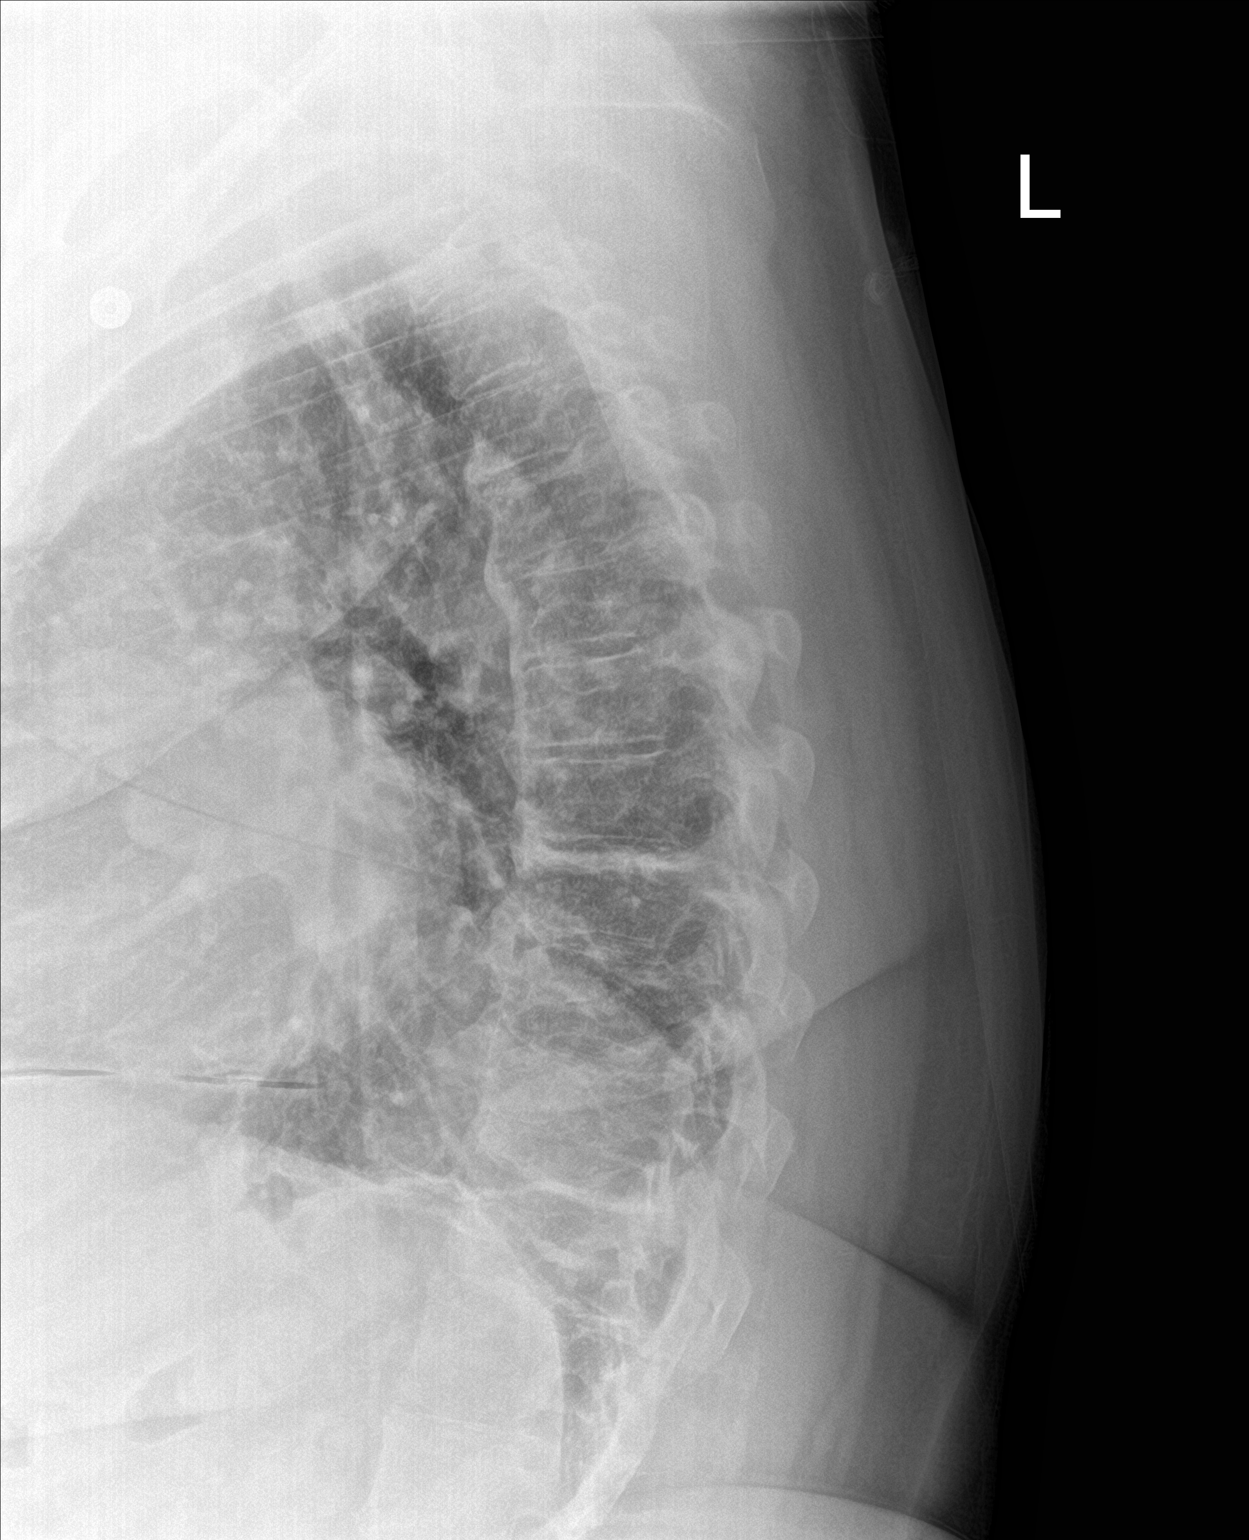

[2 of 2 positions shown; findings below may reference images not displayed]

FINDINGS: Normal thoracic kyphosis.

No evidence of fracture or dislocation. Vertebral body heights are
maintained.

Moderate multilevel degenerative changes.
IMPRESSION: Negative.

## 2024-03-02 ENCOUNTER — Telehealth: Payer: Self-pay | Admitting: Cardiology

## 2024-03-02 IMAGING — CT CT ANGIO CHEST
2 of 6 series · 18 of 36 positions shown · IV contrast (agent unspecified)
Comparison: 03/09/2021

CLINICAL DATA: Chest pain

EXAM:
CT ANGIOGRAPHY CHEST WITH CONTRAST
TECHNIQUE: Multidetector CT imaging of the chest was performed using the
standard protocol during bolus administration of intravenous
contrast. Multiplanar CT image reconstructions and MIPs were
obtained to evaluate the vascular anatomy.

[Series 7: pe thins · axial · 0.81mm/px · z∈[-310,-106]mm · 17 of 154 slices shown]
[im 9/154  lung]
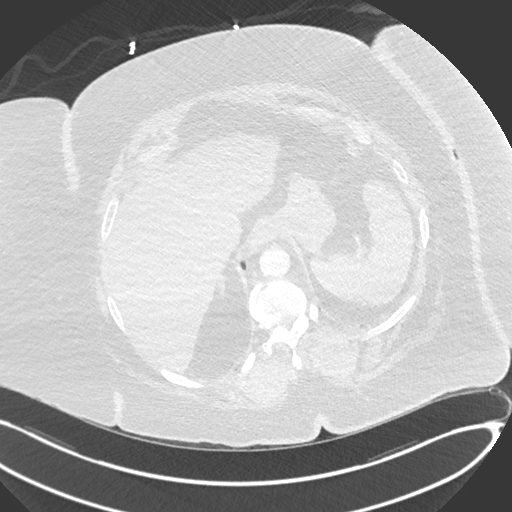
[im 18/154  mediastinal]
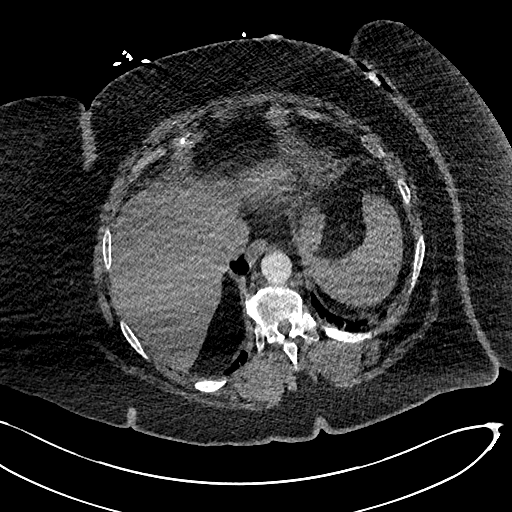
[im 26/154  lung]
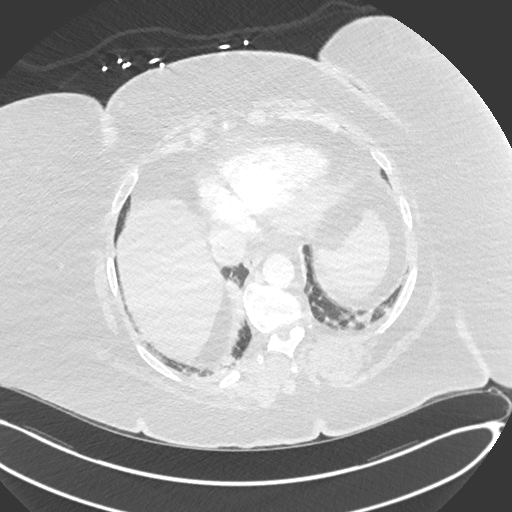
[im 35/154  mediastinal]
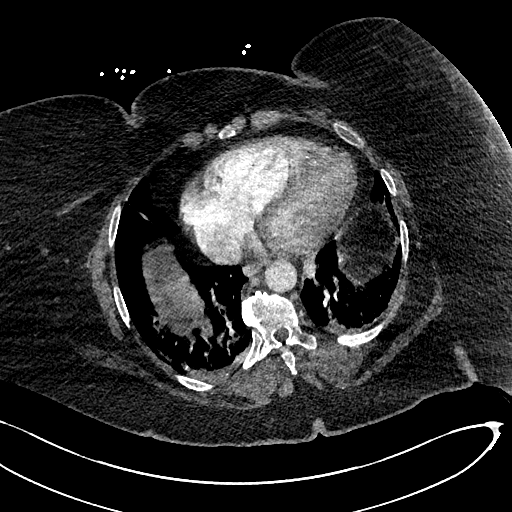
[im 43/154  lung]
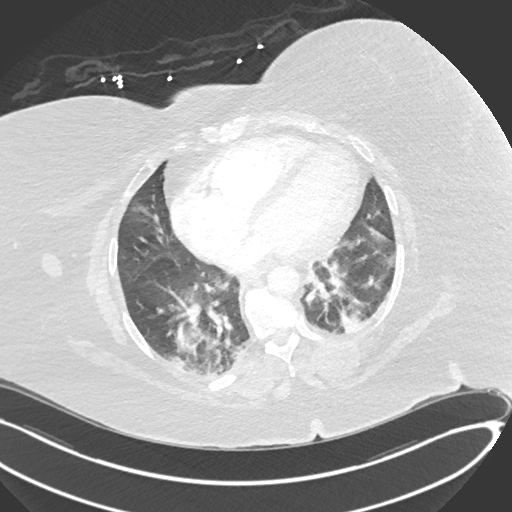
[im 52/154  mediastinal]
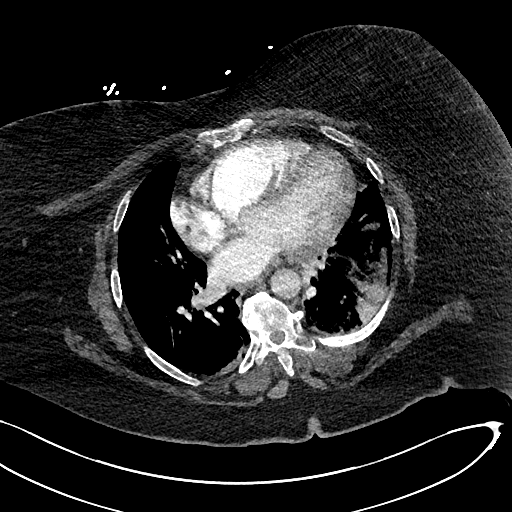
[im 60/154  lung]
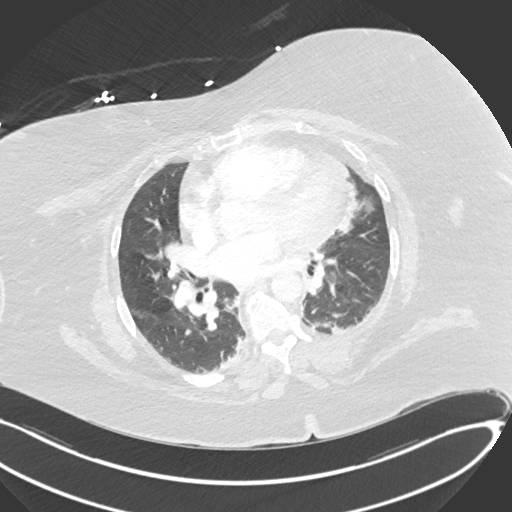
[im 69/154  mediastinal]
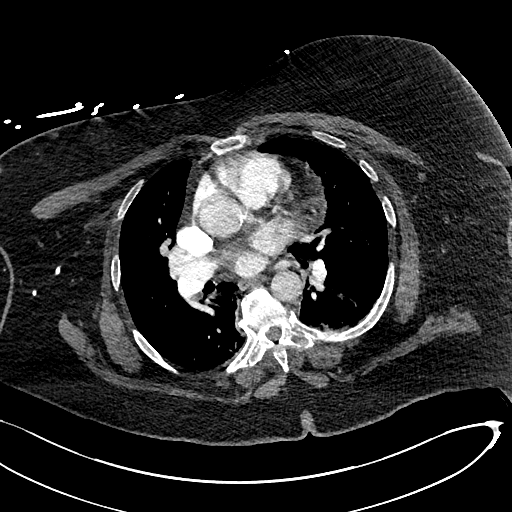
[im 77/154  lung]
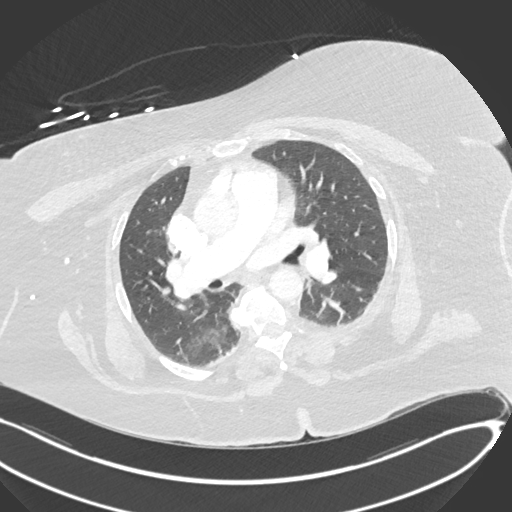
[im 86/154  mediastinal]
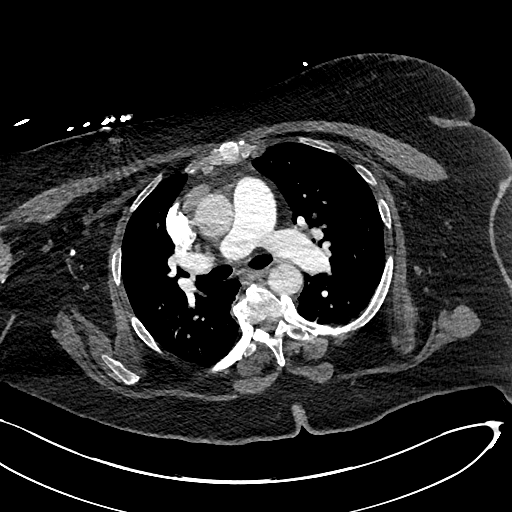
[im 94/154  lung]
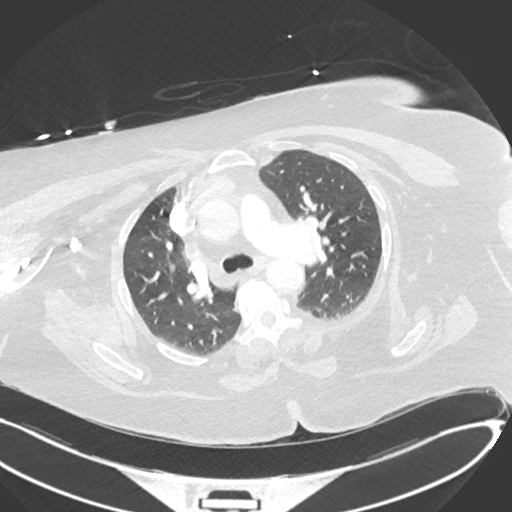
[im 103/154  mediastinal]
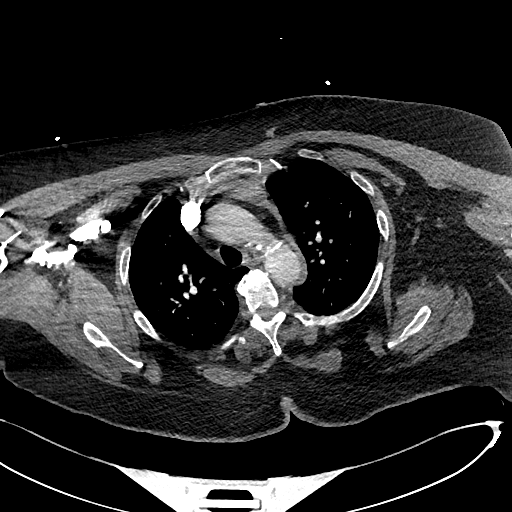
[im 111/154  lung]
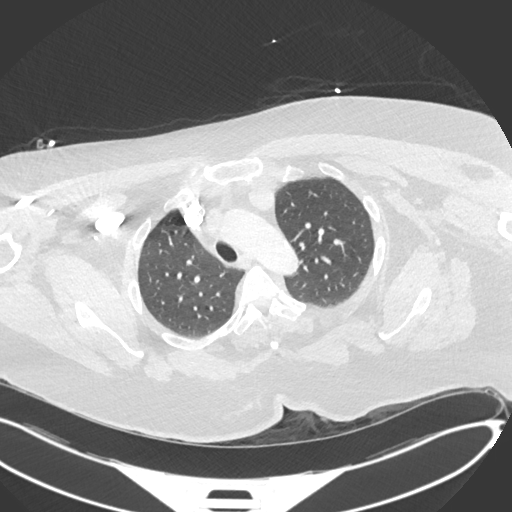
[im 120/154  mediastinal]
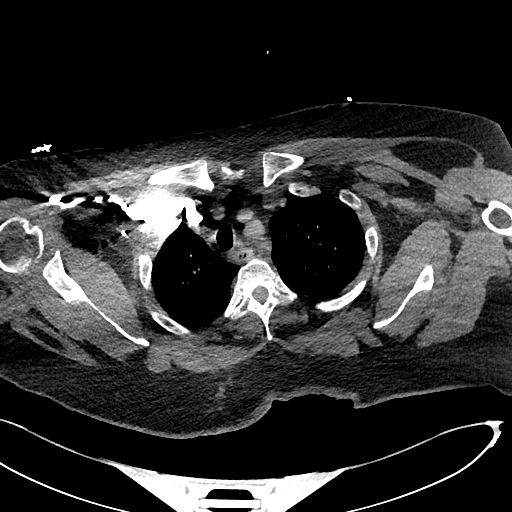
[im 128/154  lung]
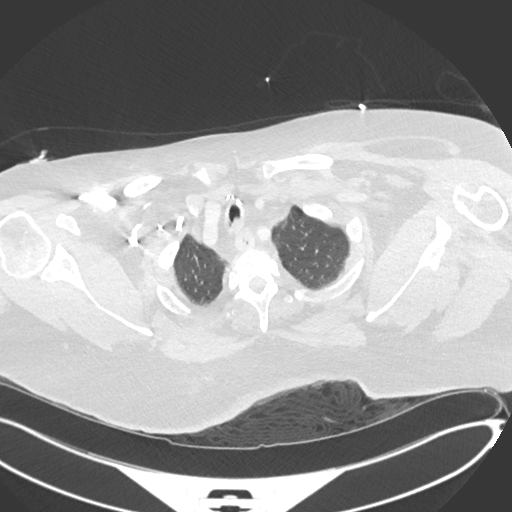
[im 137/154  mediastinal]
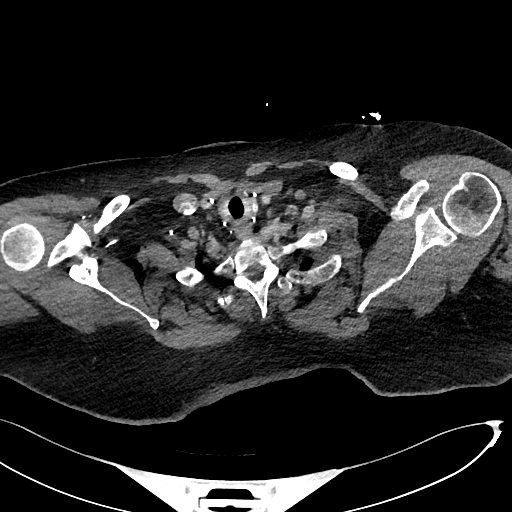
[im 145/154  lung]
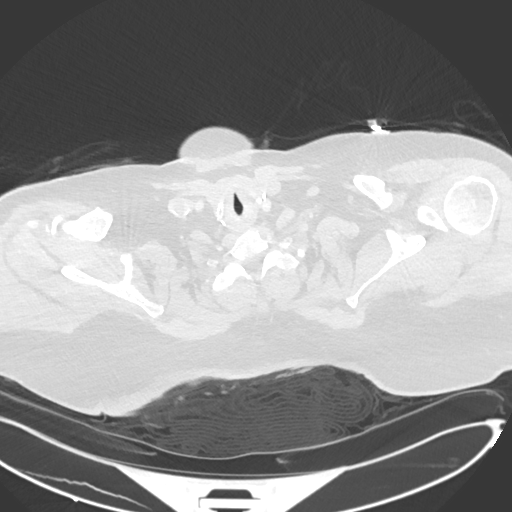

[Series 8: pe 2mm cor · coronal · 0.48mm/px · 1 of 122 slices shown]
[im 61/122  mediastinal]
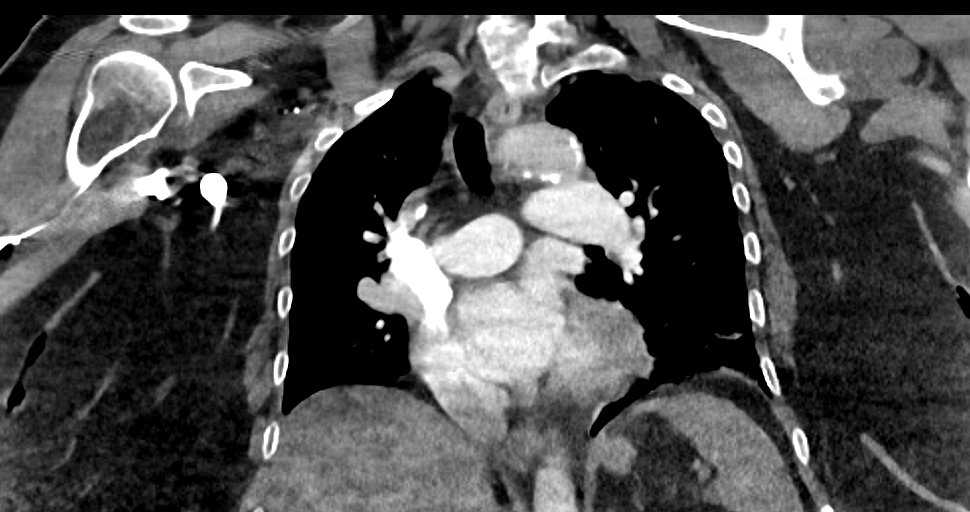

[18 of 36 positions shown; findings below may reference images not displayed]

RADIATION DOSE REDUCTION: This exam was performed according to the
departmental dose-optimization program which includes automated
exposure control, adjustment of the mA and/or kV according to
patient size and/or use of iterative reconstruction technique.

CONTRAST:  75mL OMNIPAQUE IOHEXOL 350 MG/ML SOLN
FINDINGS: Cardiovascular: Contrast injection is sufficient to demonstrate
satisfactory opacification of the pulmonary arteries to the
segmental level. There is no pulmonary embolus or evidence of right
heart strain. The size of the main pulmonary artery is normal. Heart
size is normal, with no pericardial effusion. The course and caliber
of the aorta are normal. There is mild atherosclerotic
calcification. Opacification decreased due to pulmonary arterial
phase contrast bolus timing.

Mediastinum/Nodes: No mediastinal, hilar or axillary
lymphadenopathy. Normal visualized thyroid. Thoracic esophageal
course is normal.

Lungs/Pleura: Airways are patent. No pleural effusion, lobar
consolidation, pneumothorax or pulmonary infarction. Bibasilar
atelectasis.

Upper Abdomen: Contrast bolus timing is not optimized for evaluation
of the abdominal organs. The visualized portions of the organs of
the upper abdomen are normal.

Musculoskeletal: No chest wall abnormality. No bony spinal canal
stenosis.

Review of the MIP images confirms the above findings.
IMPRESSION: 1. No pulmonary embolus or other acute thoracic abnormality.

Aortic Atherosclerosis ([id number]).

## 2024-03-02 NOTE — Telephone Encounter (Signed)
 Patient called in asking if ok to take her buspar  and gabapentin  prior to RHC scheduled for tomorrow. We went over her medications on the phone. Advised she will need to [facility] tomorrow.

## 2024-03-03 ENCOUNTER — Other Ambulatory Visit: Payer: Self-pay

## 2024-03-03 ENCOUNTER — Encounter (HOSPITAL_COMMUNITY)
Admission: RE | Disposition: A | Discharge status: Home / Self Care | Payer: Self-pay | Source: Home / Self Care | Attending: Cardiology

## 2024-03-03 ENCOUNTER — Ambulatory Visit: Payer: Self-pay | Admitting: Neurology

## 2024-03-03 ENCOUNTER — Encounter: Payer: Self-pay | Admitting: Internal Medicine

## 2024-03-03 ENCOUNTER — Ambulatory Visit (HOSPITAL_COMMUNITY)
Admission: RE | Admit: 2024-03-03 | Discharge: 2024-03-03 | Disposition: A | Discharge status: Home / Self Care | Attending: Cardiology | Admitting: Cardiology

## 2024-03-03 ENCOUNTER — Encounter (HOSPITAL_COMMUNITY): Payer: Self-pay | Admitting: Cardiology

## 2024-03-03 DIAGNOSIS — I272 Pulmonary hypertension, unspecified: Secondary | ICD-10-CM | POA: Insufficient documentation

## 2024-03-03 DIAGNOSIS — Z87891 Personal history of nicotine dependence: Secondary | ICD-10-CM | POA: Insufficient documentation

## 2024-03-03 DIAGNOSIS — Z9981 Dependence on supplemental oxygen: Secondary | ICD-10-CM | POA: Insufficient documentation

## 2024-03-03 DIAGNOSIS — J9611 Chronic respiratory failure with hypoxia: Secondary | ICD-10-CM | POA: Diagnosis not present

## 2024-03-03 DIAGNOSIS — I471 Supraventricular tachycardia, unspecified: Secondary | ICD-10-CM | POA: Insufficient documentation

## 2024-03-03 DIAGNOSIS — I5032 Chronic diastolic (congestive) heart failure: Secondary | ICD-10-CM | POA: Diagnosis not present

## 2024-03-03 DIAGNOSIS — I10 Essential (primary) hypertension: Secondary | ICD-10-CM

## 2024-03-03 DIAGNOSIS — J441 Chronic obstructive pulmonary disease with (acute) exacerbation: Secondary | ICD-10-CM | POA: Diagnosis not present

## 2024-03-03 DIAGNOSIS — I509 Heart failure, unspecified: Secondary | ICD-10-CM | POA: Diagnosis not present

## 2024-03-03 DIAGNOSIS — Z79899 Other long term (current) drug therapy: Secondary | ICD-10-CM | POA: Diagnosis not present

## 2024-03-03 DIAGNOSIS — I110 Hypertensive heart disease with heart failure: Secondary | ICD-10-CM | POA: Diagnosis present

## 2024-03-03 DIAGNOSIS — Z6841 Body Mass Index (BMI) 40.0 and over, adult: Secondary | ICD-10-CM | POA: Diagnosis not present

## 2024-03-03 HISTORY — PX: RIGHT HEART CATH: CATH118263

## 2024-03-03 LAB — POCT I-STAT EG7
Acid-Base Excess: 19 mmol/L — ABNORMAL HIGH (ref 0.0–2.0)
Acid-Base Excess: 18 mmol/L — ABNORMAL HIGH (ref 0.0–2.0)
Bicarbonate: 47.4 mmol/L — ABNORMAL HIGH (ref 20.0–28.0)
Bicarbonate: 46.3 mmol/L — ABNORMAL HIGH (ref 20.0–28.0)
Calcium, Ion: 1.1 mmol/L — ABNORMAL LOW (ref 1.15–1.40)
Calcium, Ion: 1.08 mmol/L — ABNORMAL LOW (ref 1.15–1.40)
HCT: 38 % (ref 36.0–46.0)
HCT: 38 % (ref 36.0–46.0)
Hemoglobin: 12.9 g/dL (ref 12.0–15.0)
Hemoglobin: 12.9 g/dL (ref 12.0–15.0)
O2 Saturation: 60 %
O2 Saturation: 60 %
Potassium: 3.6 mmol/L (ref 3.5–5.1)
Potassium: 3.6 mmol/L (ref 3.5–5.1)
Sodium: 140 mmol/L (ref 135–145)
Sodium: 141 mmol/L (ref 135–145)
TCO2: 50 mmol/L — ABNORMAL HIGH (ref 22–32)
TCO2: 49 mmol/L — ABNORMAL HIGH (ref 22–32)
pCO2, Ven: 76.7 mmHg (ref 44–60)
pCO2, Ven: 74.9 mmHg (ref 44–60)
pH, Ven: 7.399 (ref 7.25–7.43)
pH, Ven: 7.399 (ref 7.25–7.43)
pO2, Ven: 34 mmHg (ref 32–45)
pO2, Ven: 33 mmHg (ref 32–45)

## 2024-03-03 LAB — GLUCOSE, CAPILLARY: Glucose-Capillary: 144 mg/dL — ABNORMAL HIGH (ref 70–99)

## 2024-03-03 SURGERY — RIGHT HEART CATH
Anesthesia: LOCAL

## 2024-03-03 MED ORDER — ONDANSETRON HCL 4 MG/2ML IJ SOLN: 4.0000 mg | Freq: Four times a day (QID) | INTRAMUSCULAR | Status: DC | PRN

## 2024-03-03 MED ORDER — LABETALOL HCL 5 MG/ML IV SOLN: 10.0000 mg | INTRAVENOUS | Status: DC | PRN

## 2024-03-03 MED ORDER — LIDOCAINE HCL (PF) 1 % IJ SOLN
INTRAMUSCULAR | Status: DC | PRN
  Administered 2024-03-03: 5 mL

## 2024-03-03 MED ORDER — ACETAMINOPHEN 325 MG PO TABS
650.0000 mg | ORAL_TABLET | ORAL | Status: DC | PRN
Start: 2024-03-03 — End: 2024-03-04

## 2024-03-03 MED ORDER — FREE WATER: 250.0000 mL | Freq: Once | Status: DC

## 2024-03-03 MED ORDER — POTASSIUM CHLORIDE CRYS ER 10 MEQ PO TBCR: 20.0000 meq | EXTENDED_RELEASE_TABLET | Freq: Every day | ORAL | 3 refills | Status: DC

## 2024-03-03 MED ORDER — SODIUM CHLORIDE 0.9 % IV SOLN: 250.0000 mL | INTRAVENOUS | Status: DC | PRN

## 2024-03-03 MED ORDER — SODIUM CHLORIDE 0.9% FLUSH: 3.0000 mL | Freq: Two times a day (BID) | INTRAVENOUS | Status: DC

## 2024-03-03 MED ORDER — SODIUM CHLORIDE 0.9% FLUSH: 3.0000 mL | INTRAVENOUS | Status: DC | PRN

## 2024-03-03 MED ORDER — HEPARIN (PORCINE) IN NACL 1000-0.9 UT/500ML-% IV SOLN
INTRAVENOUS | Status: DC | PRN
  Administered 2024-03-03: 500 mL

## 2024-03-03 MED ORDER — TORSEMIDE 20 MG PO TABS: 60.0000 mg | ORAL_TABLET | Freq: Two times a day (BID) | ORAL | 0 refills | Status: DC

## 2024-03-03 MED ORDER — LIDOCAINE HCL (PF) 1 % IJ SOLN
INTRAMUSCULAR | Status: AC
  Filled 2024-03-03: qty 30

## 2024-03-03 MED ORDER — HYDRALAZINE HCL 20 MG/ML IJ SOLN: 10.0000 mg | INTRAMUSCULAR | Status: DC | PRN

## 2024-03-03 SURGICAL SUPPLY — 5 items
CATH BALLN WEDGE 5F 110CM (CATHETERS) IMPLANT
PACK CARDIAC CATHETERIZATION (CUSTOM PROCEDURE TRAY) ×2 IMPLANT
SHEATH GLIDE SLENDER 4/5FR (SHEATH) IMPLANT
TRANSDUCER W/STOPCOCK (MISCELLANEOUS) IMPLANT
TUBING ART PRESS 72 MALE/FEM (TUBING) IMPLANT

## 2024-03-03 NOTE — Discharge Instructions (Addendum)
 1. Increase torsemide  to 60 mg twice daily (3 tabs twice a day).  2. Start KCl 20 mEq daily. Brachial Site Care   This sheet gives you information about how to care for yourself after your procedure. Your health care provider may also give you more specific instructions. If you have problems or questions, contact your health care provider. What can I expect after the procedure? After the procedure, it is common to have: Bruising and tenderness at the catheter insertion area. Follow these instructions at home:  Insertion site care Follow instructions from your health care provider about how to take care of your insertion site. Make sure you: Wash your hands with soap and water  before you change your bandage (dressing). If soap and water  are not available, use hand sanitizer. Remove your dressing as told by your health care provider. In 24 hours Check your insertion site every day for signs of infection. Check for: Redness, swelling, or pain. Pus or a bad smell. Warmth. You may shower 24-48 hours after the procedure. Do not apply powder or lotion to the site.  Activity For 24 hours after the procedure, or as directed by your health care provider: Do not push or pull heavy objects with the affected arm. Do not drive yourself home from the hospital or clinic. You may drive 24 hours after the procedure unless your health care provider tells you not to. Do not lift anything that is heavier than 10 lb (4.5 kg), or the limit that you are told, until your health care provider says that it is safe.  For 24 hours

## 2024-03-03 NOTE — Interval H&P Note (Signed)
 History and Physical Interval Note:  03/03/2024 11:41 AM  Michele Owens  has presented today for surgery, with the diagnosis of heart failure.  The various methods of treatment have been discussed with the patient and family. After consideration of risks, benefits and other options for treatment, the patient has consented to  Procedure(s): RIGHT HEART CATH (N/A) as a surgical intervention.  The patient's history has been reviewed, patient examined, no change in status, stable for surgery.  I have reviewed the patient's chart and labs.  Questions were answered to the patient's satisfaction.     Braxston Quinter [employer]

## 2024-03-04 ENCOUNTER — Telehealth (HOSPITAL_COMMUNITY): Payer: Self-pay | Admitting: Vascular Surgery

## 2024-03-04 ENCOUNTER — Other Ambulatory Visit: Payer: Self-pay | Admitting: Family Medicine

## 2024-03-04 NOTE — Telephone Encounter (Signed)
 Pt called stating her arm is swelling and she has pain, and she is shaking please advise

## 2024-03-04 NOTE — Telephone Encounter (Signed)
 Can have her come by and have the nurse take a look at it.  She just had a peripheral IV, I would be surprised if there is a real problem with it.

## 2024-03-05 ENCOUNTER — Telehealth: Payer: Self-pay

## 2024-03-05 ENCOUNTER — Telehealth (HOSPITAL_COMMUNITY): Payer: Self-pay | Admitting: Cardiology

## 2024-03-05 NOTE — Telephone Encounter (Signed)
 Spoke with pharmacy, stating strips not covered by insurance. Pharmacy informed the patient and they are calling her insurance company to ask why they were denied, as they typically cover them.

## 2024-03-05 NOTE — Telephone Encounter (Signed)
 Patient called to report she has not felt well the past two days  Reports arm pain 10/10 pain Reports no swelling at the moment however swelling was present some  Denies bruises/redness/tenderness at site  Reports to also having a heaviness/CP on her chest Confirmed she did increase diuretics as ordered  Please advise

## 2024-03-05 NOTE — Telephone Encounter (Signed)
 She can come by the office to have her arm looked at if she can wait until Friday, otherwise go to ER if it is that bad.

## 2024-03-05 NOTE — Telephone Encounter (Signed)
 Copied from CRM #[id number]. Topic: Clinical - Prescription Issue >> Mar 05, 2024 12:51 PM Michele Owens wrote: Reason for CRM: Patient was at her pharmacy today and they would not dispense her ACCU-CHEK GUIDE TEST test strips to her. Agent located that the pharmacy confirmed this receipt on 11/25, so they should have been ready for her. Please call patient to discuss why she was not able to pick these up.

## 2024-03-05 NOTE — Telephone Encounter (Signed)
 Pt aware.

## 2024-03-10 NOTE — Progress Notes (Signed)
 Complex Care Management Note Care Guide Note  03/10/2024 Name: Michele Owens MRN: [id number] DOB: 1960-04-26   Complex Care Management Outreach Attempts: A third unsuccessful outreach was attempted today to offer the patient with information about available complex care management services.  Follow Up Plan:  No further outreach attempts will be made at this time. We have been unable to contact the patient to offer or enroll patient in complex care management services.  Encounter Outcome:  No Answer  [name]  [facility], [facility] VBCI Assistant Direct Dial: [phone]  Fax: [phone]

## 2024-03-11 ENCOUNTER — Telehealth (HOSPITAL_COMMUNITY): Payer: Self-pay

## 2024-03-11 NOTE — Telephone Encounter (Signed)
 Called to confirm/remind patient of their appointment at the [facility] on 03/12/24.   Appointment:   [x] Confirmed  [] Left mess   [] No answer/No voice mail  [] VM Full/unable to leave message  [] Phone not in service  Patient reminded to bring all medications and/or complete list.  Confirmed patient has transportation. Gave directions, instructed to utilize valet parking.

## 2024-03-11 NOTE — Progress Notes (Incomplete)
 Primary Care: Michele Beverley NOVAK, MD HF Cardiology: Dr. Rolan  Reason for Visit: post hospital f/u for chronic diastolic heart failure   HPI: Michele Owens is a 63 y.o. with a history of  HTN, obesity, OSA, COPD, hyperlipidemia, diastolic CHF, gout, bipolar, and SVT.   Admitted in 8/16 with diastolic CHF and COPD exacerbation.  She was admitted in 1/17 with COPD exacerbation.  She was set up for a Cardiolite  in 2/17.  This showed a small, moderate intensity partially reversible inferolateral defect.  LHC/RHC in 3/17 showed no significant CAD, mildly elevated PCWP.  PFTs in 3/17 showed severe COPD.     Admitted 10/15-10/19/17 with AECOPD and A/C diastolic CHF.  Diuresed with IV lasix . Completed course for CAP. Pt also noted have multinodular goiter on US .  Biopsy was done finally in 3/18, showing papillary thyroid  carcinoma.    Echo in 4/18 showed EF 45-50%, moderate dilated RV with moderately decreased systolic function.    She was admitted again in 4/18 with COPD exacerbation, acute on chronic diastolic CHF, and CAP.     She was admitted in 3/20 with acute on chronic diastolic CHF and treated with IV Lasix .  Echo showed EF 60-65%, mild RV enlargement with normal function, respirophasic septal variation.      Echo in 5/21 showed LVEF 55-60%, RV normal, RVSP was normal.  RHC in 6/21 showed mean RA 11, PA 44/14 mean 27, mean PCWP 15, CI 4.6, PVR 1.1.   Echo in 2/24 showed EF 55-60%, mild LVH, mildly D-shaped septum.    Admitted 9/24 with somnolence/hypoxemia in the setting of hypercapnia likely from polypharmacy. She was put on Bipap and improved.   Felt dizzy 2/25, called EMS and found to be in SVT. Given adenosine and converted to NSR. K was 2.5, but was felt to be dilutional. Labs on re-check showed K 3.8.  Numerous ED visits since for varies reasons: mainly arthritis related pain, atypical chest pain, AKI, dermatitis, nausea/vomiting, etc.   Most recent hospitalization was last month  10/25 for a/c CHF. She had presented w/ worsening dyspnea after missing several days of torsemide . Physical exam and CXR w/ evidence of volume overload, per hospital notes. Echo showed 55-60%, RV mod enlarged, interventricular septum is flattened in systole and diastole, consistent with right ventricular pressure and volume overload. RV systolic fx noted to be normal. She was seen by cardiology and diuresed w/ IV Lasix  and transitioned to PO torsemide  at 20 mg bid. Farxiga  was discontinued d/t recent UTI. All other GDMT continued. Cardiology note from Dr. Ren Ny recommended outpatient RHC.   She presents today for hospital f/u. She is starting to feel her breathing getting worse again. She has had to increase her home supp O2 up from 2>>3L/min. She reports full compliance w/ torsemide , taking 1 tablet twice daily. BP is well controlled at 119/67.     PMH: 1. Back and hip pain 2. HTN 3. Obesity 4. GERD 5. Hyperlipidemia 6. Depression 7. COPD: Has quit smoking. Now on home oxygen  2L [place].  PFTs (3/17) with severe COPD.  8. 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 Event monitor (12/14) with only NSR noted.  10. Chest pain: Atypical.  ETT-Cardiolite  (12/13) with EF 60%, no ischemia/infarction. Lexiscan  Cardiolite  (2/17) with small, moderate-intensity partially reversible inferolateral defect. LHC (3/17): No significant CAD. 11. Syncope: Uncertain etiology.   12. OHS/OSA: Cannot tolerate CPAP. Sleep study negative for OSA in 5/17.  13. Gout 14.  Migraines 15. Diabetes 16. Diastolic CHF: Echo (8/16) with EF 55-60%, grade II diastolic dysfunction.  - RHC (6/82) with mean RA 8, PA 45/20 mean 30, mean PCWP 20; PVR 1.5 WU.  - Echo (4/18): EF 45-50%, diffuse hypokinesis, grade II diastolic dysfunction, moderately dilated RV with moderately decreased systolic function. - Echo (4/20): EF 60-65%, mild RV enlargement with normal systolic function. Respirophasic  septal variation noted.   - RHC (6/21): mean RA 11, PA 44/14 mean 27, mean PCWP 15, CI 4.6, PVR 1.1.  - Echo (2/24): EF 55-60%, mild LVH, mildly D-shaped septum.   17. 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  18. Hypothyroidism  Current Outpatient Medications  Medication Sig Dispense Refill   ACCU-CHEK GUIDE TEST test strip USE TO CHECK BLOOD SUGAR 3 TIMES DAILY 100 each 12   Accu-Chek Softclix Lancets lancets Use to check blood sugar 3 times daily 100 each 12   acetaminophen  (TYLENOL ) 325 MG tablet Take 2 tablets (650 mg total) by mouth every 6 (six) hours as needed for mild pain (pain score 1-3) or fever (or Fever >/= 101).     albuterol  (VENTOLIN  HFA) 108 (90 Base) MCG/ACT inhaler Inhale 1-2 puffs into the lungs every 6 (six) hours as needed for wheezing or shortness of breath. 1 each 1   allopurinol  (ZYLOPRIM ) 100 MG tablet Take 1 tablet (100 mg total) by mouth daily. 90 tablet 3   aspirin  EC 81 MG tablet Take 1 tablet (81 mg total) by mouth daily. Swallow whole. 90 tablet 3   blood glucose meter kit and supplies KIT Dispense based on patient and insurance preference. Use up to four times daily as directed. 1 each 0   Blood Glucose Monitoring Suppl DEVI 1 each by Does not apply route in the morning, at noon, and at bedtime. May substitute to any manufacturer covered by patient's insurance. 1 each 0   budesonide  (PULMICORT ) 0.25 MG/2ML nebulizer solution Inhale 2 mLs (0.25 mg total) by nebulization 2 (two) times daily. 120 mL 0   busPIRone  (BUSPAR ) 30 MG tablet Take 1 tablet (30 mg total) by mouth 2 (two) times daily. 180 tablet 3   carvedilol  (COREG ) 6.25 MG tablet Take 1 tablet (6.25 mg total) by mouth 2 (two) times daily. 60 tablet 11   clotrimazole  (LOTRIMIN ) 1 % cream Apply topically 2 (two) times daily. 30 g 0   diclofenac  Sodium (VOLTAREN ) 1 % GEL Apply 4 g topically 4 (four) times daily. 120 g 0   dicyclomine  (BENTYL ) 20 MG tablet Take 1 tablet (20 mg total) by mouth 2  (two) times daily. 20 tablet 0   EPINEPHrine  0.3 mg/0.3 mL IJ SOAJ injection Inject 0.3 mg into the muscle once as needed for anaphylaxis.     fenofibrate  (TRICOR ) 145 MG tablet Take 1 tablet (145 mg total) by mouth daily. 30 tablet 11   FEROSUL 325 (65 Fe) MG tablet Take 1 tablet (325 mg total) by mouth daily. 30 tablet 11   gabapentin  (NEURONTIN ) 100 MG capsule Take 1 capsule 3 times a day by oral route as needed for 30 days.     ipratropium-albuterol  (DUONEB) 0.5-2.5 (3) MG/3ML SOLN Take 3 mLs by nebulization every 4 (four) hours as needed. 360 mL 0   levocetirizine (XYZAL ) 5 MG tablet TAKE 1 TABLET (5 MG TOTAL) BY MOUTH AT BEDTIME. 30 tablet 0   levothyroxine  (SYNTHROID ) 125 MCG tablet Take 1 tablet (125 mcg total) by mouth at bedtime. 90 tablet 0   losartan  (COZAAR )  25 MG tablet Take 1 tablet (25 mg total) by mouth daily. 30 tablet 11   lurasidone  (LATUDA ) 20 MG TABS tablet Take 1 tablet (20 mg total) by mouth at bedtime. Take with a snack. This is for mood and sleep. 90 tablet 3   nitroGLYCERIN  (NITROSTAT ) 0.4 MG SL tablet Place 1 tablet (0.4 mg total) under the tongue every 5 (five) minutes as needed for chest pain. 12 tablet 6   omeprazole  (PRILOSEC) 20 MG capsule Take 2 capsules (40 mg total) by mouth daily. 180 capsule 1   ondansetron  (ZOFRAN -ODT) 4 MG disintegrating tablet Take 1 tablet (4 mg total) by mouth every 8 (eight) hours as needed for nausea or vomiting. 20 tablet 0   OXYGEN  Inhale 2 L into the lungs continuous.     potassium chloride  (KLOR-CON  M) 10 MEQ tablet Take 2 tablets (20 mEq total) by mouth daily. 60 tablet 3   Rimegepant Sulfate (NURTEC) 75 MG TBDP Take 1 tablet (75 mg total) by mouth every other day. Prevents and Treats Migraine Headaches 45 tablet 3   rizatriptan  (MAXALT ) 10 MG tablet Take 1 tablet (10 mg total) by mouth as needed for migraine. May repeat in 2 hours if needed 10 tablet 0   rosuvastatin  (CRESTOR ) 10 MG tablet Take 1 tablet (10 mg total) by mouth every  evening. 30 tablet 11   spironolactone  (ALDACTONE ) 25 MG tablet Take 0.5 tablets (12.5 mg total) by mouth daily. 15 tablet 0   torsemide  (DEMADEX ) 20 MG tablet Take 3 tablets (60 mg total) by mouth 2 (two) times daily. 120 tablet 0   umeclidinium-vilanterol (ANORO ELLIPTA ) 62.5-25 MCG/ACT AEPB Inhale 1 puff into the lungs daily. 98 each 0   Vitamin D , Ergocalciferol , (DRISDOL ) 1.25 MG (50000 UNIT) CAPS capsule Take 1 capsule (50,000 Units total) by mouth every 7 (seven) days. 12 capsule 0   No current facility-administered medications for this visit.   Allergies  Allergen Reactions   Bee Venom Anaphylaxis    All over my body (swelling)   Fioricet  [Butalbital -Apap-Caffeine ] Nausea And Vomiting and Rash   Ibuprofen  Rash and Other (See Comments)    Severe rash   Lamisil  [Terbinafine ] Rash and Other (See Comments)    Pt states this causes her to feel funny   Nsaids Other (See Comments)    Per MD's orders    Social History   Socioeconomic History   Marital status: Single    Spouse name: Not on file   Number of children: 1   Years of education: Not on file   Highest education level: Not on file  Occupational History   Occupation: disabled  Tobacco Use   Smoking status: Former    Current packs/day: 0.00    Average packs/day: 0.5 packs/day for 41.0 years (20.5 ttl pk-yrs)    Types: Cigarettes    Start date: 07/1975    Quit date: 07/2016    Years since quitting: 7.6   Smokeless tobacco: Never   Tobacco comments:    Pt stop smoking 07/04/2011  Vaping Use   Vaping status: Never Used  Substance and Sexual Activity   Alcohol use: Not Currently    Comment: quit 12 years ago   Drug use: Not Currently    Types: Marijuana, Cocaine    Comment: quit 12 years ago   Sexual activity: Yes  Other Topics Concern   Not on file  Social History Narrative   Not on file   Social Drivers of Corporate Investment Banker  Strain: Low Risk  (10/24/2022)   Overall Financial Resource Strain  (CARDIA)    Difficulty of Paying Living Expenses: Not very hard  Food Insecurity: No Food Insecurity (01/22/2024)   Hunger Vital Sign    Worried About Running Out of Food in the Last Year: Never true    Ran Out of Food in the Last Year: Never true  Transportation Needs: No Transportation Needs (01/22/2024)   PRAPARE - Administrator, Civil Service (Medical): No    Lack of Transportation (Non-Medical): No  Physical Activity: Inactive (02/01/2021)   Exercise Vital Sign    Days of Exercise per Week: 0 days    Minutes of Exercise per Session: 0 min  Stress: Stress Concern Present (02/23/2021)   [employer] of Occupational Health - Occupational Stress Questionnaire    Feeling of Stress : To some extent  Social Connections: Moderately Isolated (01/22/2024)   Social Connection and Isolation Panel    Frequency of Communication with Friends and Family: More than three times a week    Frequency of Social Gatherings with Friends and Family: More than three times a week    Attends Religious Services: 1 to 4 times per year    Active Member of [employer] or Organizations: No    Attends Banker Meetings: Never    Marital Status: Divorced  Catering Manager Violence: Not At Risk (01/22/2024)   Humiliation, Afraid, Rape, and Kick questionnaire    Fear of Current or Ex-Partner: No    Emotionally Abused: No    Physically Abused: No    Sexually Abused: No   Family History  Problem Relation Age of Onset   Cancer Father        thought to be due to exposure to concrete   Diabetes Mother    Thyroid  cancer Mother        dx in her 71s-60s   Cancer Maternal Uncle        2 uncles with cancer NOS   Brain cancer Paternal Aunt    Cancer Cousin        maternal first cousin - NOS   Cancer Cousin        maternal first cousin - NOS   Wt Readings from Last 3 Encounters:  03/03/24 (!) 144.2 kg (318 lb)  02/13/24 (!) 136.9 kg (301 lb 12.8 oz)  02/07/24 (!) 153.3 kg (338 lb)    There were no vitals taken for this visit.  PHYSICAL EXAM: GENERAL: super morbidly obese, NAD Lungs- diminished at the bases  CARDIAC:  thick neck, JVD not well visualized          Normal rate with regular rhythm. Trace b/l pretibial edema  ABDOMEN: obese, soft, non-tender, non-distended.  EXTREMITIES: Warm and well perfused.  NEUROLOGIC: No obvious FND   ECG: not performed   ASSESSMENT & PLAN: 1. Chronic diastolic CHF: Has had multiple prior admissions with CHF and COPD exacerbations.  Echo 05/2022 with EF 55-60%, D-shaped septum, Grade IDD.  Recent echo 10/25 showed EF 55-60%, RV mod enlarged, interventricular septum is flattened in systole and diastole, consistent with right ventricular pressure and volume overload. RV systolic fx noted to be normal. - NYHA Class IIIb confounded by super morbid obesity, deconditioning and arthritis  - w/ increased O2 requirements since d/c from hospital w/ evidence of mild LEE on exam, though full assessment regarding the extensiveness of volume overload is extremely difficult given overall body habitus - increase torsemide  to 40 mg bid -  keep off Farxiga  given h/o GU infections/limited mobility  - d/w Dr. Rolan. Will plan for outpatient RHC to guide further diuretic dosing  - Continue losartan  25 mg daily.  - Continue spironolactone  25 mg daily.  - I reviewed recent hospital labs and SCr/K stable. Will repeat BMP in 1 wk w/ torsemide  increase  - Cardiomems would be beneficial given multiple admissions for CHF and difficult exam. Unfortunately she has Medicaid and they will not cover. 2. HTN: controlled on current regimen - no change  3. Obesity: There is no height or weight on file to calculate BMI. - She is interested in GLP1i. Will refer to pharmD  4. Chronic Hypoxemic Respiratory Failure: Suspect due to COPD and OHS. She is on 2L home oxygen  but recently increased to 3L. Suspect volume overload, see plan above   - Should follow with  pulmonary.  5. SVT: Episode in 2/25, required 6 mg of adensine to break. 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 - Continue Coreg  to 6.25 mg bid.     F/u w/ APP after RHC   Harlene HERO [place], [place] 03/11/2024

## 2024-03-12 ENCOUNTER — Encounter (HOSPITAL_COMMUNITY): Payer: Self-pay | Admitting: Emergency Medicine

## 2024-03-12 ENCOUNTER — Emergency Department (HOSPITAL_COMMUNITY)

## 2024-03-12 ENCOUNTER — Other Ambulatory Visit: Payer: Self-pay

## 2024-03-12 ENCOUNTER — Emergency Department (HOSPITAL_COMMUNITY)
Admission: EM | Admit: 2024-03-12 | Discharge: 2024-03-13 | Disposition: A | Attending: Emergency Medicine | Admitting: Emergency Medicine

## 2024-03-12 ENCOUNTER — Ambulatory Visit (HOSPITAL_COMMUNITY): Admission: RE | Admit: 2024-03-12 | Discharge: 2024-03-12 | Disposition: A | Source: Ambulatory Visit

## 2024-03-12 DIAGNOSIS — Z955 Presence of coronary angioplasty implant and graft: Secondary | ICD-10-CM | POA: Diagnosis not present

## 2024-03-12 DIAGNOSIS — E119 Type 2 diabetes mellitus without complications: Secondary | ICD-10-CM | POA: Insufficient documentation

## 2024-03-12 DIAGNOSIS — I110 Hypertensive heart disease with heart failure: Secondary | ICD-10-CM | POA: Diagnosis not present

## 2024-03-12 DIAGNOSIS — Z7982 Long term (current) use of aspirin: Secondary | ICD-10-CM | POA: Diagnosis not present

## 2024-03-12 DIAGNOSIS — R0601 Orthopnea: Secondary | ICD-10-CM | POA: Diagnosis not present

## 2024-03-12 DIAGNOSIS — M545 Low back pain, unspecified: Secondary | ICD-10-CM | POA: Diagnosis not present

## 2024-03-12 DIAGNOSIS — Z79899 Other long term (current) drug therapy: Secondary | ICD-10-CM | POA: Diagnosis not present

## 2024-03-12 DIAGNOSIS — D72829 Elevated white blood cell count, unspecified: Secondary | ICD-10-CM | POA: Diagnosis not present

## 2024-03-12 DIAGNOSIS — R0602 Shortness of breath: Secondary | ICD-10-CM | POA: Insufficient documentation

## 2024-03-12 DIAGNOSIS — J4489 Other specified chronic obstructive pulmonary disease: Secondary | ICD-10-CM | POA: Diagnosis not present

## 2024-03-12 DIAGNOSIS — Z7951 Long term (current) use of inhaled steroids: Secondary | ICD-10-CM | POA: Diagnosis not present

## 2024-03-12 DIAGNOSIS — Z85850 Personal history of malignant neoplasm of thyroid: Secondary | ICD-10-CM | POA: Insufficient documentation

## 2024-03-12 DIAGNOSIS — I5032 Chronic diastolic (congestive) heart failure: Secondary | ICD-10-CM | POA: Insufficient documentation

## 2024-03-12 LAB — CBC WITH DIFFERENTIAL/PLATELET
Abs Immature Granulocytes: 0.08 K/uL — ABNORMAL HIGH (ref 0.00–0.07)
Basophils Absolute: 0.1 K/uL (ref 0.0–0.1)
Basophils Relative: 0 %
Eosinophils Absolute: 0.2 K/uL (ref 0.0–0.5)
Eosinophils Relative: 1 %
HCT: 37.6 % (ref 36.0–46.0)
Hemoglobin: 11.3 g/dL — ABNORMAL LOW (ref 12.0–15.0)
Immature Granulocytes: 1 %
Lymphocytes Relative: 17 %
Lymphs Abs: 2.3 K/uL (ref 0.7–4.0)
MCH: 29.7 pg (ref 26.0–34.0)
MCHC: 30.1 g/dL (ref 30.0–36.0)
MCV: 98.7 fL (ref 80.0–100.0)
Monocytes Absolute: 1 K/uL (ref 0.1–1.0)
Monocytes Relative: 7 %
Neutro Abs: 10.2 K/uL — ABNORMAL HIGH (ref 1.7–7.7)
Neutrophils Relative %: 74 %
Platelets: 293 K/uL (ref 150–400)
RBC: 3.81 MIL/uL — ABNORMAL LOW (ref 3.87–5.11)
RDW: 13.5 % (ref 11.5–15.5)
WBC: 13.8 K/uL — ABNORMAL HIGH (ref 4.0–10.5)
nRBC: 0 % (ref 0.0–0.2)

## 2024-03-12 MED ORDER — IPRATROPIUM-ALBUTEROL 0.5-2.5 (3) MG/3ML IN SOLN
3.0000 mL | Freq: Once | RESPIRATORY_TRACT | Status: AC
  Administered 2024-03-12: 3 mL via RESPIRATORY_TRACT
  Filled 2024-03-12: qty 3

## 2024-03-12 MED ORDER — OXYCODONE HCL 5 MG PO TABS
5.0000 mg | ORAL_TABLET | Freq: Once | ORAL | Status: AC
  Administered 2024-03-12: 5 mg via ORAL
  Filled 2024-03-12: qty 1

## 2024-03-12 MED ORDER — LIDOCAINE 5 % EX PTCH
1.0000 | MEDICATED_PATCH | CUTANEOUS | Status: DC
  Administered 2024-03-12: 1 via TRANSDERMAL
  Filled 2024-03-12: qty 1

## 2024-03-12 NOTE — ED Provider Notes (Incomplete)
 [facility] Provider Note   CSN: [id number] Arrival date & time: 03/12/24  2211     Patient presents with: Chest Pain   Michele Owens is a 63 y.o. female with history of hypertension, COPD, hyperlipidemia, diastolic CHF presents with complaints of shortness of breath and chest pain over the past couple days.  Symptoms are worse at night with reclining.  Reports compliance with her diuretic.  No significant cough or URI symptoms.  Additionally complains of lower back pain.  Symptoms are worse with movement.  No injury or trauma.  No urinary symptoms.  {Add pertinent medical, surgical, social history, OB history to YEP:67052}  Chest Pain     Past Medical History:  Diagnosis Date  . Anxiety   . Arthritis   . Asthma   . Chronic diastolic CHF (congestive heart failure) (HCC)   . COPD (chronic obstructive pulmonary disease) (HCC)    Uses Oxygen  at night  . Depression   . Diabetes mellitus without complication (HCC)   . GERD (gastroesophageal reflux disease)   . Gout   . Headache    migraines  . History of DVT of lower extremity   . Hypertension   . Morbid obesity with BMI of 45.0-49.9, adult (HCC)   . Thyroid  cancer (HCC) 09/23/2016   Past Surgical History:  Procedure Laterality Date  . BUNIONECTOMY Bilateral   . CARDIAC CATHETERIZATION N/A 06/23/2015   Procedure: Right/Left Heart Cath and Coronary Angiography;  Surgeon: Ezra GORMAN Shuck, MD;  Location: [facility] INVASIVE CV LAB;  Service: Cardiovascular;  Laterality: N/A;  . COLONOSCOPY WITH PROPOFOL  N/A 12/15/2015   Procedure: COLONOSCOPY WITH PROPOFOL ;  Surgeon: Norleen Hint, MD;  Location: [facility] ENDOSCOPY;  Service: Endoscopy;  Laterality: N/A;  . RIGHT HEART CATH N/A 10/01/2019   Procedure: RIGHT HEART CATH;  Surgeon: Shuck Ezra GORMAN, MD;  Location: [facility] INVASIVE CV LAB;  Service: Cardiovascular;  Laterality: N/A;  . RIGHT HEART CATH N/A 03/03/2024   Procedure: RIGHT HEART CATH;  Surgeon:  Shuck Ezra GORMAN, MD;  Location[facility] INVAS[facility] CV LAB;  Service: Cardiovascular;  Laterality: N/A;  . THYROIDECTOMY  09/19/2016  . THYROIDECTOMY N/A 09/19/2016   Procedure: TOTAL THYROIDECTOMY;  Surgeon: Eletha Boas, MD;  Location: [facility] OR;  Service: General;  Laterality: N/A;  . TONSILLECTOMY    . TOTAL ABDOMINAL HYSTERECTOMY  07/14/10     Prior to Admission medications   Medication Sig Start Date End Date Taking? Authorizing Provider  ACCU-CHEK GUIDE TEST test strip USE TO CHECK BLOOD SUGAR 3 TIMES DAILY 03/04/24   Webb, Padonda B, FNP  Accu-Chek Softclix Lancets lancets Use to check blood sugar 3 times daily 12/26/23   [name], MD  acetaminophen  (TYLENOL ) 325 MG tablet Take 2 tablets (650 mg total) by mouth every 6 (six) hours as needed for mild pain (pain score 1-3) or fever (or Fever >/= 101). 01/23/24   Arrien, Mauricio Daniel, MD  albuterol  (VENTOLIN  HFA) 108 [phone] Base) MCG/ACT inhaler Inhale 1-2 puffs into the lungs every 6 (six) hours as needed for wheezing or shortness of breath. 12/26/23   [name], MD  allopurinol  (ZYLOPRIM ) 100 MG tablet Take 1 tablet (100 mg total) by mouth daily. 11/13/23 11/07/24  [name], MD  aspirin  EC 81 MG tablet Take 1 tablet (81 mg total) by mouth daily. Swallow whole. 11/13/23 11/07/24  [name], MD  blood glucose meter kit and supplies KIT Dispense based on patient and insurance  preference. Use up to four times daily as directed. 12/25/20   Lue Elsie BROCKS, MD  Blood Glucose Monitoring Suppl DEVI 1 each by Does not apply route in the morning, at noon, and at bedtime. May substitute to any manufacturer covered by patient's insurance. 11/13/23   [name], MD  budesonide  (PULMICORT ) 0.25 MG/2ML nebulizer solution Inhale 2 mLs (0.25 mg total) by nebulization 2 (two) times daily. 11/13/23   [name], MD  busPIRone  (BUSPAR ) 30 MG tablet Take 1 tablet (30 mg total) by mouth 2 (two) times daily. 12/05/23 11/29/24  [name], MD  carvedilol  (COREG ) 6.25 MG tablet Take 1 tablet (6.25 mg total) by mouth 2 (two) times daily. 12/26/23   [name], MD  clotrimazole  (LOTRIMIN ) 1 % cream Apply topically 2 (two) times daily. 12/13/23   [name], MD  diclofenac  Sodium (VOLTAREN ) 1 % GEL Apply 4 g topically 4 (four) times daily. 12/15/23   Freddi Hamilton, MD  dicyclomine  (BENTYL ) 20 MG tablet Take 1 tablet (20 mg total) by mouth 2 (two) times daily. 12/26/23   [name], MD  EPINEPHrine  0.3 mg/0.3 mL IJ SOAJ injection Inject 0.3 mg into the muscle once as needed for anaphylaxis.    [provider]  fenofibrate  (TRICOR ) 145 MG tablet Take 1 tablet (145 mg total) by mouth daily. 12/26/23   [name], MD  FEROSUL 325 (65 Fe) MG tablet Take 1 tablet (325 mg total) by mouth daily. 12/26/23   [name], MD  gabapentin  (NEURONTIN ) 100 MG capsule Take 1 capsule 3 times a day by oral route as needed for 30 days. 12/06/23   [provider]  ipratropium-albuterol  (DUONEB) 0.5-2.5 (3) MG/3ML SOLN Take 3 mLs by nebulization every 4 (four) hours as needed. 01/23/24   Arrien, Mauricio Daniel, MD  levocetirizine (XYZAL ) 5 MG tablet TAKE 1 TABLET (5 MG TOTAL) BY MOUTH AT BEDTIME. 12/17/23   [name], MD  levothyroxine  (SYNTHROID ) 125 MCG tablet Take 1 tablet (125 mcg total) by mouth at bedtime. 01/02/24   [name], MD  losartan  (COZAAR ) 25 MG tablet Take 1 tablet (25 mg total) by mouth daily. 11/13/23   [name], MD  lurasidone  (LATUDA ) 20 MG TABS tablet Take 1 tablet (20 mg total) by mouth at bedtime. Take with a snack. This is for mood and sleep. 12/25/23 12/19/24  [name], MD  nitroGLYCERIN  (NITROSTAT ) 0.4 MG SL tablet Place 1 tablet (0.4 mg total) under the tongue every 5 (five) minutes as needed for chest pain. 12/25/23   [name], MD  omeprazole  (PRILOSEC) 20 MG capsule Take 2 capsules (40 mg total) by mouth daily. 11/06/23   [name], MD   ondansetron  (ZOFRAN -ODT) 4 MG disintegrating tablet Take 1 tablet (4 mg total) by mouth every 8 (eight) hours as needed for nausea or vomiting. 12/26/23   [name], MD  OXYGEN  Inhale 2 L into the lungs continuous.    [provider]  potassium chloride  (KLOR-CON  M) 10 MEQ tablet Take 2 tablets (20 mEq total) by mouth daily. 03/03/24   Rolan Ezra RAMAN, MD  Rimegepant Sulfate (NURTEC) 75 MG TBDP Take 1 tablet (75 mg total) by mouth every other day. Prevents and Treats Migraine Headaches 12/25/23 12/19/24  [name], MD  rizatriptan  (MAXALT ) 10 MG tablet Take 1 tablet (10 mg total) by mouth as needed for migraine. May repeat in 2 hours if needed  01/23/24   Arrien, Mauricio Daniel, MD  rosuvastatin  (CRESTOR ) 10 MG tablet Take 1 tablet (10 mg total) by mouth every evening. 11/13/23   [name], MD  spironolactone  (ALDACTONE ) 25 MG tablet Take 0.5 tablets (12.5 mg total) by mouth daily. 01/23/24   Arrien, Mauricio Daniel, MD  torsemide  (DEMADEX ) 20 MG tablet Take 3 tablets (60 mg total) by mouth 2 (two) times daily. 03/03/24   Rolan Ezra RAMAN, MD  umeclidinium-vilanterol (ANORO ELLIPTA ) 62.5-25 MCG/ACT AEPB Inhale 1 puff into the lungs daily. 01/02/24   [name], MD  Vitamin D , Ergocalciferol , (DRISDOL ) 1.25 MG (50000 UNIT) CAPS capsule Take 1 capsule (50,000 Units total) by mouth every 7 (seven) days. 11/13/23   [name], MD    Allergies: Bee venom, Fioricet  [butalbital -apap-caffeine ], Ibuprofen , Lamisil  [terbinafine ], and Nsaids    Review of Systems  Cardiovascular:  Positive for chest pain.    Updated Vital Signs BP 108/81 (BP Location: Left Arm)   Pulse 83   Temp 99.1 F (37.3 C) (Oral)   Resp 15   Ht 5' 5 (1.651 m)   Wt (!) 144 kg   SpO2 96%   BMI 52.83 kg/m   Physical Exam  (all labs ordered are listed, but only abnormal results are displayed) Labs Reviewed - No data to display  EKG: None  Radiology: No results  found.  {Document cardiac monitor, telemetry assessment procedure when appropriate:32947} Procedures   Medications Ordered in the ED - No data to display  Clinical Course as of 03/12/24 2254  Wed Mar 12, 2024  2252 Patient with history of COPD and CHF presents with complaints of orthopnea with associated chest pain over the past couple days.  Upon arrival she is hemodynamically stable.  On exam her lung sounds are diminished.  She is on her baseline 2 L.  She appears comfortable.  Additionally complains of atraumatic back pain.  She has tenderness to her right lower lumbar.  No CVAT.  Her abdomen is nontender.  Will obtain routine labs, EKG and chest x-ray. [JT]    Clinical Course User Index [JT] Donnajean Lynwood DEL, PA-C   {Click here for ABCD2, HEART and other calculators REFRESH Note before signing:1}                              Medical Decision Making Amount and/or Complexity of Data Reviewed Labs: ordered. Radiology: ordered.   This patient presents to the ED with chief complaint(s) of SOB.  The complaint involves an extensive differential diagnosis and also carries with it a high risk of complications and morbidity.   Pertinent past medical history as listed in HPI  The differential diagnosis includes  ***  Additional history obtained: Records reviewed Care Everywhere/External Records  Disposition:   ***  Social Determinants of Health:   Patient's {DINY:73154}  increases the complexity of managing their presentation  This note was dictated with voice recognition software.  Despite best efforts at proofreading, errors may have occurred which can change the documentation meaning.    {Document critical care time when appropriate  Document review of labs and clinical decision tools ie CHADS2VASC2, etc  Document your independent review of radiology images and any outside records  Document your discussion with family members, caretakers and with consultants  Document social  determinants of health affecting pt's care  Document your decision making why or why not admission, treatments were needed:32947:::1}   Final diagnoses:  None    ED Discharge Orders     None

## 2024-03-12 NOTE — ED Triage Notes (Addendum)
 Pt BIB EMS from home with c/o right sided chest pain and right flank pain. 2lpm [place] chronic. Heart cath on Monday here.

## 2024-03-12 NOTE — ED Notes (Signed)
 Pt back from x-ray.

## 2024-03-12 NOTE — ED Provider Notes (Signed)
  EMERGENCY DEPARTMENT AT [facility] Provider Note   CSN: [id number] Arrival date & time: 03/12/24  2211     Patient presents with: Chest Pain   Michele Owens is a 63 y.o. female with history of hypertension, COPD, hyperlipidemia, diastolic CHF presents with  {Add pertinent medical, surgical, social history, OB history to YEP:67052}  Chest Pain     Past Medical History:  Diagnosis Date  . Anxiety   . Arthritis   . Asthma   . Chronic diastolic CHF (congestive heart failure) (HCC)   . COPD (chronic obstructive pulmonary disease) (HCC)    Uses Oxygen  at night  . Depression   . Diabetes mellitus without complication (HCC)   . GERD (gastroesophageal reflux disease)   . Gout   . Headache    migraines  . History of DVT of lower extremity   . Hypertension   . Morbid obesity with BMI of 45.0-49.9, adult (HCC)   . Thyroid  cancer (HCC) 09/23/2016   Past Surgical History:  Procedure Laterality Date  . BUNIONECTOMY Bilateral   . CARDIAC CATHETERIZATION N/A 06/23/2015   Procedure: Right/Left Heart Cath and Coronary Angiography;  Surgeon: Ezra GORMAN Shuck, MD;  Location: [facility] INVASIVE CV LAB;  Service: Cardiovascular;  Laterality: N/A;  . COLONOSCOPY WITH PROPOFOL  N/A 12/15/2015   Procedure: COLONOSCOPY WITH PROPOFOL ;  Surgeon: Norleen Hint, MD;  Location: [facility] ENDOSCOPY;  Service: Endoscopy;  Laterality: N/A;  . RIGHT HEART CATH N/A 10/01/2019   Procedure: RIGHT HEART CATH;  Surgeon: Shuck Ezra GORMAN, MD;  Location: [facility] INVASIVE CV LAB;  Service: Cardiovascular;  Laterality: N/A;  . RIGHT HEART CATH N/A 03/03/2024   Procedure: RIGHT HEART CATH;  Surgeon: Shuck Ezra GORMAN, MD;  Location: [facility] INVASIVE CV LAB;  Service: Cardiovascular;  Laterality: N/A;  . THYROIDECTOMY  09/19/2016  . THYROIDECTOMY N/A 09/19/2016   Procedure: TOTAL THYROIDECTOMY;  Surgeon: Eletha Boas, MD;  Location: [facility] OR;  Service: General;  Laterality: N/A;  . TONSILLECTOMY    . TOTAL ABDOMINAL  HYSTERECTOMY  07/14/10     Prior to Admission medications   Medication Sig Start Date End Date Taking? Authorizing Provider  ACCU-CHEK GUIDE TEST test strip USE TO CHECK BLOOD SUGAR 3 TIMES DAILY 03/04/24   Webb, Padonda B, FNP  Accu-Chek Softclix Lancets lancets Use to check blood sugar 3 times daily 12/26/23   [name], MD  acetaminophen  (TYLENOL ) 325 MG tablet Take 2 tablets (650 mg total) by mouth every 6 (six) hours as needed for mild pain (pain score 1-3) or fever (or Fever >/= 101). 01/23/24   Arrien, Mauricio Daniel, MD  albuterol  (VENTOLIN  HFA) 108 [phone] Base) MCG/ACT inhaler Inhale 1-2 puffs into the lungs every 6 (six) hours as needed for wheezing or shortness of breath. 12/26/23   [name], MD  allopurinol  (ZYLOPRIM ) 100 MG tablet Take 1 tablet (100 mg total) by mouth daily. 11/13/23 11/07/24  [name], MD  aspirin  EC 81 MG tablet Take 1 tablet (81 mg total) by mouth daily. Swallow whole. 11/13/23 11/07/24  [name], MD  blood glucose meter kit and supplies KIT Dispense based on patient and insurance preference. Use up to four times daily as directed. 12/25/20   Lue Elsie BROCKS, MD  Blood Glucose Monitoring Suppl DEVI 1 each by Does not apply route in the morning, at noon, and at bedtime. May substitute to any manufacturer covered by patient's insurance. 11/13/23   [name], MD  budesonide  (PULMICORT )  0.25 MG/2ML nebulizer solution Inhale 2 mLs (0.25 mg total) by nebulization 2 (two) times daily. 11/13/23   [name], MD  busPIRone  (BUSPAR ) 30 MG tablet Take 1 tablet (30 mg total) by mouth 2 (two) times daily. 12/05/23 11/29/24  [name], MD  carvedilol  (COREG ) 6.25 MG tablet Take 1 tablet (6.25 mg total) by mouth 2 (two) times daily. 12/26/23   [name], MD  clotrimazole  (LOTRIMIN ) 1 % cream Apply topically 2 (two) times daily. 12/13/23   [name], MD  diclofenac  Sodium (VOLTAREN ) 1 % GEL Apply 4 g topically 4 (four) times  daily. 12/15/23   Freddi Hamilton, MD  dicyclomine  (BENTYL ) 20 MG tablet Take 1 tablet (20 mg total) by mouth 2 (two) times daily. 12/26/23   [name], MD  EPINEPHrine  0.3 mg/0.3 mL IJ SOAJ injection Inject 0.3 mg into the muscle once as needed for anaphylaxis.    [provider]  fenofibrate  (TRICOR ) 145 MG tablet Take 1 tablet (145 mg total) by mouth daily. 12/26/23   [name], MD  FEROSUL 325 (65 Fe) MG tablet Take 1 tablet (325 mg total) by mouth daily. 12/26/23   [name], MD  gabapentin  (NEURONTIN ) 100 MG capsule Take 1 capsule 3 times a day by oral route as needed for 30 days. 12/06/23   [provider]  ipratropium-albuterol  (DUONEB) 0.5-2.5 (3) MG/3ML SOLN Take 3 mLs by nebulization every 4 (four) hours as needed. 01/23/24   Arrien, Mauricio Daniel, MD  levocetirizine (XYZAL ) 5 MG tablet TAKE 1 TABLET (5 MG TOTAL) BY MOUTH AT BEDTIME. 12/17/23   [name], MD  levothyroxine  (SYNTHROID ) 125 MCG tablet Take 1 tablet (125 mcg total) by mouth at bedtime. 01/02/24   [name], MD  losartan  (COZAAR ) 25 MG tablet Take 1 tablet (25 mg total) by mouth daily. 11/13/23   [name], MD  lurasidone  (LATUDA ) 20 MG TABS tablet Take 1 tablet (20 mg total) by mouth at bedtime. Take with a snack. This is for mood and sleep. 12/25/23 12/19/24  [name], MD  nitroGLYCERIN  (NITROSTAT ) 0.4 MG SL tablet Place 1 tablet (0.4 mg total) under the tongue every 5 (five) minutes as needed for chest pain. 12/25/23   [name], MD  omeprazole  (PRILOSEC) 20 MG capsule Take 2 capsules (40 mg total) by mouth daily. 11/06/23   [name], MD  ondansetron  (ZOFRAN -ODT) 4 MG disintegrating tablet Take 1 tablet (4 mg total) by mouth every 8 (eight) hours as needed for nausea or vomiting. 12/26/23   [name], MD  OXYGEN  Inhale 2 L into the lungs continuous.    [provider]  potassium chloride  (KLOR-CON  M) 10 MEQ tablet Take 2 tablets  (20 mEq total) by mouth daily. 03/03/24   Rolan Ezra RAMAN, MD  Rimegepant Sulfate (NURTEC) 75 MG TBDP Take 1 tablet (75 mg total) by mouth every other day. Prevents and Treats Migraine Headaches 12/25/23 12/19/24  [name], MD  rizatriptan  (MAXALT ) 10 MG tablet Take 1 tablet (10 mg total) by mouth as needed for migraine. May repeat in 2 hours if needed 01/23/24   Arrien, Elidia Sieving, MD  rosuvastatin  (CRESTOR ) 10 MG tablet Take 1 tablet (10 mg total) by mouth every evening. 11/13/23   [name], MD  spironolactone  (ALDACTONE ) 25 MG tablet Take 0.5 tablets (12.5 mg total) by mouth daily. 01/23/24   Arrien, Mauricio Daniel, MD  torsemide  (DEMADEX ) 20  MG tablet Take 3 tablets (60 mg total) by mouth 2 (two) times daily. 03/03/24   Rolan Ezra RAMAN, MD  umeclidinium-vilanterol (ANORO ELLIPTA ) 62.5-25 MCG/ACT AEPB Inhale 1 puff into the lungs daily. 01/02/24   [name], MD  Vitamin D , Ergocalciferol , (DRISDOL ) 1.25 MG (50000 UNIT) CAPS capsule Take 1 capsule (50,000 Units total) by mouth every 7 (seven) days. 11/13/23   [name], MD    Allergies: Bee venom, Fioricet  [butalbital -apap-caffeine ], Ibuprofen , Lamisil  [terbinafine ], and Nsaids    Review of Systems  Cardiovascular:  Positive for chest pain.    Updated Vital Signs BP 108/81 (BP Location: Left Arm)   Pulse 83   Temp 99.1 F (37.3 C) (Oral)   Resp 15   Ht 5' 5 (1.651 m)   Wt (!) 144 kg   SpO2 96%   BMI 52.83 kg/m   Physical Exam  (all labs ordered are listed, but only abnormal results are displayed) Labs Reviewed - No data to display  EKG: None  Radiology: No results found.  {Document cardiac monitor, telemetry assessment procedure when appropriate:32947} Procedures   Medications Ordered in the ED - No data to display    {Click here for ABCD2, HEART and other calculators REFRESH Note before signing:1}                              Medical Decision Making  ***  {Document critical  care time when appropriate  Document review of labs and clinical decision tools ie CHADS2VASC2, etc  Document your independent review of radiology images and any outside records  Document your discussion with family members, caretakers and with consultants  Document social determinants of health affecting pt's care  Document your decision making why or why not admission, treatments were needed:32947:::1}   Final diagnoses:  None    ED Discharge Orders     None

## 2024-03-12 NOTE — ED Notes (Signed)
 Pt taken for xray

## 2024-03-12 NOTE — ED Notes (Signed)
 CCMD called.

## 2024-03-13 ENCOUNTER — Other Ambulatory Visit: Payer: Self-pay | Admitting: Family Medicine

## 2024-03-13 DIAGNOSIS — G4709 Other insomnia: Secondary | ICD-10-CM

## 2024-03-13 LAB — TROPONIN I (HIGH SENSITIVITY): Troponin I (High Sensitivity): 6 ng/L (ref <18)

## 2024-03-13 LAB — BASIC METABOLIC PANEL WITH GFR
Anion gap: 5 (ref 5–15)
BUN: 35 mg/dL — ABNORMAL HIGH (ref 8–23)
CO2: 39 mmol/L — ABNORMAL HIGH (ref 22–32)
Calcium: 8.8 mg/dL — ABNORMAL LOW (ref 8.9–10.3)
Chloride: 95 mmol/L — ABNORMAL LOW (ref 98–111)
Creatinine, Ser: 1.25 mg/dL — ABNORMAL HIGH (ref 0.44–1.00)
GFR, Estimated: 48 mL/min — ABNORMAL LOW (ref >60)
Glucose, Bld: 143 mg/dL — ABNORMAL HIGH (ref 70–99)
Potassium: 4.2 mmol/L (ref 3.5–5.1)
Sodium: 139 mmol/L (ref 135–145)

## 2024-03-13 LAB — RESP PANEL BY RT-PCR (RSV, FLU A&B, COVID)  RVPGX2
Influenza A by PCR: NEGATIVE
Influenza B by PCR: NEGATIVE
Resp Syncytial Virus by PCR: NEGATIVE
SARS Coronavirus 2 by RT PCR: NEGATIVE

## 2024-03-13 LAB — BRAIN NATRIURETIC PEPTIDE: B Natriuretic Peptide: 12.1 pg/mL (ref 0.0–100.0)

## 2024-03-13 MED ORDER — ALBUTEROL SULFATE (2.5 MG/3ML) 0.083% IN NEBU: 2.5000 mg | INHALATION_SOLUTION | Freq: Four times a day (QID) | RESPIRATORY_TRACT | 12 refills | Status: AC | PRN

## 2024-03-13 MED ORDER — LIDOCAINE 4 % EX PTCH: 1.0000 | MEDICATED_PATCH | CUTANEOUS | 0 refills | Status: DC

## 2024-03-13 NOTE — Discharge Instructions (Addendum)
 You were evaluated in the emergency room for chest pain and shortness of breath in addition to low back pain. Your lab work and imaging did not show any significant abnormality.  Refill for lidocaine  patches and nebulizer fluid was sent to your pharmacy.  If your back pain persists you may call the orthopedic doctor listed for an appointment.

## 2024-03-13 NOTE — ED Notes (Signed)
 [facility] Called no ETA given

## 2024-03-18 NOTE — Progress Notes (Signed)
 Primary Care: Michele Beverley NOVAK, MD HF Cardiology: Dr. Rolan  Reason for Visit: post hospital f/u for chronic diastolic heart failure   HPI: Michele Owens is a 63 y.o. with a history of  HTN, obesity, OSA, COPD, hyperlipidemia, diastolic CHF, gout, bipolar, and SVT.   Admitted in 8/16 with diastolic CHF and COPD exacerbation.  She was admitted in 1/17 with COPD exacerbation.  She was set up for a Cardiolite  in 2/17.  This showed a small, moderate intensity partially reversible inferolateral defect.  LHC/RHC in 3/17 showed no significant CAD, mildly elevated PCWP.  PFTs in 3/17 showed severe COPD.     Admitted 10/15-10/19/17 with AECOPD and A/C diastolic CHF.  Diuresed with IV lasix . Completed course for CAP. Pt also noted have multinodular goiter on US .  Biopsy was done finally in 3/18, showing papillary thyroid  carcinoma.    Echo in 4/18 showed EF 45-50%, moderate dilated RV with moderately decreased systolic function.    She was admitted again in 4/18 with COPD exacerbation, acute on chronic diastolic CHF, and CAP.     She was admitted in 3/20 with acute on chronic diastolic CHF and treated with IV Lasix .  Echo showed EF 60-65%, mild RV enlargement with normal function, respirophasic septal variation.      Echo in 5/21 showed LVEF 55-60%, RV normal, RVSP was normal.  RHC in 6/21 showed mean RA 11, PA 44/14 mean 27, mean PCWP 15, CI 4.6, PVR 1.1.   Echo in 2/24 showed EF 55-60%, mild LVH, mildly D-shaped septum.    Admitted 9/24 with somnolence/hypoxemia in the setting of hypercapnia likely from polypharmacy. She was put on Bipap and improved.   Felt dizzy 2/25, called EMS and found to be in SVT. Given adenosine and converted to NSR. K was 2.5, but was felt to be dilutional. Labs on re-check showed K 3.8.  Numerous ED visits since for varies reasons: mainly arthritis related pain, atypical chest pain, AKI, dermatitis, nausea/vomiting, etc.   Admitted 10/25 for a/c CHF. She had  presented w/ worsening dyspnea after missing several days of torsemide . Physical exam and CXR w/ evidence of volume overload, per hospital notes. Echo showed 55-60%, RV mod enlarged, interventricular septum is flattened in systole and diastole, consistent with RV pressure and volume overload. RV systolic fx noted to be normal. She was seen by cardiology and diuresed w/ IV Lasix  and transitioned to PO torsemide  at 20 mg bid. Farxiga  was discontinued d/t recent UTI. All other GDMT continued. Cardiology note from Dr. Ren Ny recommended outpatient RHC.   RHC 11/25: R>L HF with mildly decreased PAPi, mod pulmonary venous hypertension, preserved CO  Today she returns for AHF follow up. Overall feeling ***. Denies palpitations, CP, dizziness, edema, or PND/Orthopnea. *** SOB. Appetite ok. No fever or chills. Weight at home *** pounds. Taking all medications. Denies ETOH, tobacco or drug use.     PMH: 1. Back and hip pain 2. HTN 3. Obesity 4. GERD 5. Hyperlipidemia 6. Depression 7. COPD: Has quit smoking. Now on home oxygen  2L [place].  PFTs (3/17) with severe COPD.  8. 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 Event monitor (12/14) with only NSR noted.  10. Chest pain: Atypical.  ETT-Cardiolite  (12/13) with EF 60%, no ischemia/infarction. Lexiscan  Cardiolite  (2/17) with small, moderate-intensity partially reversible inferolateral defect. LHC (3/17): No significant CAD. 11. Syncope: Uncertain etiology.   12. OHS/OSA: Cannot tolerate CPAP. Sleep study negative for OSA in 5/17.  13. Gout 14. Migraines 15. Diabetes 16. Diastolic CHF: Echo (8/16) with EF 55-60%, grade II diastolic dysfunction.  - RHC (6/82) with mean RA 8, PA 45/20 mean 30, mean PCWP 20; PVR 1.5 WU.  - Echo (4/18): EF 45-50%, diffuse hypokinesis, grade II diastolic dysfunction, moderately dilated RV with moderately decreased systolic function. - Echo (4/20): EF 60-65%, mild RV enlargement with  normal systolic function. Respirophasic septal variation noted.   - RHC (6/21): mean RA 11, PA 44/14 mean 27, mean PCWP 15, CI 4.6, PVR 1.1.  - Echo (2/24): EF 55-60%, mild LVH, mildly D-shaped septum.   17. 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  18. Hypothyroidism  Current Outpatient Medications  Medication Sig Dispense Refill   ACCU-CHEK GUIDE TEST test strip USE TO CHECK BLOOD SUGAR 3 TIMES DAILY 100 each 12   Accu-Chek Softclix Lancets lancets Use to check blood sugar 3 times daily 100 each 12   acetaminophen  (TYLENOL ) 325 MG tablet Take 2 tablets (650 mg total) by mouth every 6 (six) hours as needed for mild pain (pain score 1-3) or fever (or Fever >/= 101).     albuterol  (PROVENTIL ) (2.5 MG/3ML) 0.083% nebulizer solution Take 3 mLs (2.5 mg total) by nebulization every 6 (six) hours as needed for wheezing or shortness of breath. 75 mL 12   albuterol  (VENTOLIN  HFA) 108 (90 Base) MCG/ACT inhaler Inhale 1-2 puffs into the lungs every 6 (six) hours as needed for wheezing or shortness of breath. 1 each 1   allopurinol  (ZYLOPRIM ) 100 MG tablet Take 1 tablet (100 mg total) by mouth daily. 90 tablet 3   aspirin  EC 81 MG tablet Take 1 tablet (81 mg total) by mouth daily. Swallow whole. 90 tablet 3   blood glucose meter kit and supplies KIT Dispense based on patient and insurance preference. Use up to four times daily as directed. 1 each 0   Blood Glucose Monitoring Suppl DEVI 1 each by Does not apply route in the morning, at noon, and at bedtime. May substitute to any manufacturer covered by patient's insurance. 1 each 0   budesonide  (PULMICORT ) 0.25 MG/2ML nebulizer solution Inhale 2 mLs (0.25 mg total) by nebulization 2 (two) times daily. 120 mL 0   busPIRone  (BUSPAR ) 30 MG tablet Take 1 tablet (30 mg total) by mouth 2 (two) times daily. 180 tablet 3   carvedilol  (COREG ) 6.25 MG tablet Take 1 tablet (6.25 mg total) by mouth 2 (two) times daily. 60 tablet 11   clotrimazole  (LOTRIMIN ) 1  % cream Apply topically 2 (two) times daily. 30 g 0   diclofenac  Sodium (VOLTAREN ) 1 % GEL Apply 4 g topically 4 (four) times daily. 120 g 0   dicyclomine  (BENTYL ) 20 MG tablet Take 1 tablet (20 mg total) by mouth 2 (two) times daily. 20 tablet 0   EPINEPHrine  0.3 mg/0.3 mL IJ SOAJ injection Inject 0.3 mg into the muscle once as needed for anaphylaxis.     fenofibrate  (TRICOR ) 145 MG tablet Take 1 tablet (145 mg total) by mouth daily. 30 tablet 11   FEROSUL 325 (65 Fe) MG tablet Take 1 tablet (325 mg total) by mouth daily. 30 tablet 11   gabapentin  (NEURONTIN ) 100 MG capsule Take 1 capsule 3 times a day by oral route as needed for 30 days.     ipratropium-albuterol  (DUONEB) 0.5-2.5 (3) MG/3ML SOLN Take 3 mLs by nebulization every 4 (four) hours as needed. 360 mL 0   levocetirizine (XYZAL ) 5 MG tablet TAKE 1  TABLET (5 MG TOTAL) BY MOUTH AT BEDTIME. 30 tablet 0   levothyroxine  (SYNTHROID ) 125 MCG tablet Take 1 tablet (125 mcg total) by mouth at bedtime. 90 tablet 0   lidocaine  (HM LIDOCAINE  PATCH) 4 % Place 1 patch onto the skin daily. 20 patch 0   losartan  (COZAAR ) 25 MG tablet Take 1 tablet (25 mg total) by mouth daily. 30 tablet 11   lurasidone  (LATUDA ) 20 MG TABS tablet Take 1 tablet (20 mg total) by mouth at bedtime. Take with a snack. This is for mood and sleep. 90 tablet 3   nitroGLYCERIN  (NITROSTAT ) 0.4 MG SL tablet Place 1 tablet (0.4 mg total) under the tongue every 5 (five) minutes as needed for chest pain. 12 tablet 6   omeprazole  (PRILOSEC) 20 MG capsule Take 2 capsules (40 mg total) by mouth daily. 180 capsule 1   ondansetron  (ZOFRAN -ODT) 4 MG disintegrating tablet Take 1 tablet (4 mg total) by mouth every 8 (eight) hours as needed for nausea or vomiting. 20 tablet 0   OXYGEN  Inhale 2 L into the lungs continuous.     potassium chloride  (KLOR-CON  M) 10 MEQ tablet Take 2 tablets (20 mEq total) by mouth daily. 60 tablet 3   Rimegepant Sulfate (NURTEC) 75 MG TBDP Take 1 tablet (75 mg total)  by mouth every other day. Prevents and Treats Migraine Headaches 45 tablet 3   rizatriptan  (MAXALT ) 10 MG tablet Take 1 tablet (10 mg total) by mouth as needed for migraine. May repeat in 2 hours if needed 10 tablet 0   rosuvastatin  (CRESTOR ) 10 MG tablet Take 1 tablet (10 mg total) by mouth every evening. 30 tablet 11   spironolactone  (ALDACTONE ) 25 MG tablet Take 0.5 tablets (12.5 mg total) by mouth daily. 15 tablet 0   torsemide  (DEMADEX ) 20 MG tablet Take 3 tablets (60 mg total) by mouth 2 (two) times daily. 120 tablet 0   umeclidinium-vilanterol (ANORO ELLIPTA ) 62.5-25 MCG/ACT AEPB Inhale 1 puff into the lungs daily. 98 each 0   Vitamin D , Ergocalciferol , (DRISDOL ) 1.25 MG (50000 UNIT) CAPS capsule Take 1 capsule (50,000 Units total) by mouth every 7 (seven) days. 12 capsule 0   No current facility-administered medications for this visit.   Allergies  Allergen Reactions   Bee Venom Anaphylaxis    All over my body (swelling)   Fioricet  [Butalbital -Apap-Caffeine ] Nausea And Vomiting and Rash   Ibuprofen  Rash and Other (See Comments)    Severe rash   Lamisil  [Terbinafine ] Rash and Other (See Comments)    Pt states this causes her to feel funny   Nsaids Other (See Comments)    Per MD's orders    Social History   Socioeconomic History   Marital status: Single    Spouse name: Not on file   Number of children: 1   Years of education: Not on file   Highest education level: Not on file  Occupational History   Occupation: disabled  Tobacco Use   Smoking status: Former    Current packs/day: 0.00    Average packs/day: 0.5 packs/day for 41.0 years (20.5 ttl pk-yrs)    Types: Cigarettes    Start date: 07/1975    Quit date: 07/2016    Years since quitting: 7.6   Smokeless tobacco: Never   Tobacco comments:    Pt stop smoking 07/04/2011  Vaping Use   Vaping status: Never Used  Substance and Sexual Activity   Alcohol use: Not Currently    Comment: quit 12 years ago  Drug use:  Not Currently    Types: Marijuana, Cocaine    Comment: quit 12 years ago   Sexual activity: Yes  Other Topics Concern   Not on file  Social History Narrative   Not on file   Social Drivers of Health   Financial Resource Strain: Low Risk  (10/24/2022)   Overall Financial Resource Strain (CARDIA)    Difficulty of Paying Living Expenses: Not very hard  Food Insecurity: No Food Insecurity (01/22/2024)   Hunger Vital Sign    Worried About Running Out of Food in the Last Year: Never true    Ran Out of Food in the Last Year: Never true  Transportation Needs: No Transportation Needs (01/22/2024)   PRAPARE - Administrator, Civil Service (Medical): No    Lack of Transportation (Non-Medical): No  Physical Activity: Inactive (02/01/2021)   Exercise Vital Sign    Days of Exercise per Week: 0 days    Minutes of Exercise per Session: 0 min  Stress: Stress Concern Present (02/23/2021)   [employer] of Occupational Health - Occupational Stress Questionnaire    Feeling of Stress : To some extent  Social Connections: Moderately Isolated (01/22/2024)   Social Connection and Isolation Panel    Frequency of Communication with Friends and Family: More than three times a week    Frequency of Social Gatherings with Friends and Family: More than three times a week    Attends Religious Services: 1 to 4 times per year    Active Member of [employer] or Organizations: No    Attends Banker Meetings: Never    Marital Status: Divorced  Catering Manager Violence: Not At Risk (01/22/2024)   Humiliation, Afraid, Rape, and Kick questionnaire    Fear of Current or Ex-Partner: No    Emotionally Abused: No    Physically Abused: No    Sexually Abused: No   Family History  Problem Relation Age of Onset   Cancer Father        thought to be due to exposure to concrete   Diabetes Mother    Thyroid  cancer Mother        dx in her 60s-60s   Cancer Maternal Uncle        2 uncles with  cancer NOS   Brain cancer Paternal Aunt    Cancer Cousin        maternal first cousin - NOS   Cancer Cousin        maternal first cousin - NOS   Wt Readings from Last 3 Encounters:  03/12/24 (!) 144 kg (317 lb 7.4 oz)  03/03/24 (!) 144.2 kg (318 lb)  02/13/24 (!) 136.9 kg (301 lb 12.8 oz)   There were no vitals taken for this visit.  PHYSICAL EXAM: General:  *** appearing.  No respiratory difficulty Neck: JVD *** cm.  Cor: Regular rate & rhythm. No murmurs. Lungs: clear Extremities: no edema  Neuro: alert & oriented x 3. Affect pleasant.   ECG: not performed   ASSESSMENT & PLAN: 1. Chronic diastolic CHF: Has had multiple prior admissions with CHF and COPD exacerbations.  Echo 05/2022 with EF 55-60%, D-shaped septum, Grade IDD.  Recent echo 10/25 showed EF 55-60%, RV mod enlarged, interventricular septum is flattened in systole and diastole, consistent with right ventricular pressure and volume overload. RV systolic fx noted to be normal. -RHC 11/25: R>L HF with mildly decreased PAPi, mod pulmonary venous hypertension, preserved CO - NYHA Class IIIb confounded by  super morbid obesity, deconditioning and arthritis  - w/ increased O2 requirements since d/c from hospital w/ evidence of mild LEE on exam, though full assessment regarding the extensiveness of volume overload is extremely difficult given overall body habitus - Continue torsemide  60 mg bid - keep off Farxiga  given h/o GU infections/limited mobility   - Continue losartan  25 mg daily.  - Continue spironolactone  25 mg daily.  - Cardiomems would be beneficial given multiple admissions for CHF and difficult exam. Unfortunately she has Medicaid and they will not cover. 2. HTN: controlled on current regimen - no change  3. Obesity: There is no height or weight on file to calculate BMI. - She is interested in GLP1i. Will refer to pharmD  4. Chronic Hypoxemic Respiratory Failure: Suspect due to COPD and OHS. She is on 2L home  oxygen  but recently increased to 3L. Suspect volume overload, see plan above   - Should follow with pulmonary.  5. SVT: Episode in 2/25, required 6 mg of adensine to break. 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 - Continue Coreg  to 6.25 mg bid.     Follow up ***  Michele LITTIE Coe, NP 03/18/2024

## 2024-03-19 ENCOUNTER — Encounter (HOSPITAL_COMMUNITY): Payer: Self-pay

## 2024-03-19 ENCOUNTER — Ambulatory Visit (HOSPITAL_COMMUNITY): Admission: RE | Admit: 2024-03-19 | Discharge: 2024-03-19 | Attending: Internal Medicine

## 2024-03-19 VITALS — BP 120/78 | HR 78 | Ht 65.0 in | Wt 327.4 lb

## 2024-03-19 DIAGNOSIS — Z9981 Dependence on supplemental oxygen: Secondary | ICD-10-CM | POA: Insufficient documentation

## 2024-03-19 DIAGNOSIS — E8770 Fluid overload, unspecified: Secondary | ICD-10-CM | POA: Insufficient documentation

## 2024-03-19 DIAGNOSIS — E66813 Obesity, class 3: Secondary | ICD-10-CM | POA: Diagnosis not present

## 2024-03-19 DIAGNOSIS — I471 Supraventricular tachycardia, unspecified: Secondary | ICD-10-CM | POA: Insufficient documentation

## 2024-03-19 DIAGNOSIS — Z87891 Personal history of nicotine dependence: Secondary | ICD-10-CM | POA: Insufficient documentation

## 2024-03-19 DIAGNOSIS — Z6841 Body Mass Index (BMI) 40.0 and over, adult: Secondary | ICD-10-CM | POA: Insufficient documentation

## 2024-03-19 DIAGNOSIS — G4733 Obstructive sleep apnea (adult) (pediatric): Secondary | ICD-10-CM | POA: Insufficient documentation

## 2024-03-19 DIAGNOSIS — I10 Essential (primary) hypertension: Secondary | ICD-10-CM

## 2024-03-19 DIAGNOSIS — I5032 Chronic diastolic (congestive) heart failure: Secondary | ICD-10-CM | POA: Insufficient documentation

## 2024-03-19 DIAGNOSIS — I272 Pulmonary hypertension, unspecified: Secondary | ICD-10-CM | POA: Insufficient documentation

## 2024-03-19 DIAGNOSIS — J449 Chronic obstructive pulmonary disease, unspecified: Secondary | ICD-10-CM | POA: Insufficient documentation

## 2024-03-19 DIAGNOSIS — Z79899 Other long term (current) drug therapy: Secondary | ICD-10-CM | POA: Insufficient documentation

## 2024-03-19 DIAGNOSIS — J9611 Chronic respiratory failure with hypoxia: Secondary | ICD-10-CM | POA: Insufficient documentation

## 2024-03-19 DIAGNOSIS — I110 Hypertensive heart disease with heart failure: Secondary | ICD-10-CM | POA: Insufficient documentation

## 2024-03-19 MED ORDER — TORSEMIDE 20 MG PO TABS: 80.0000 mg | ORAL_TABLET | Freq: Two times a day (BID) | ORAL | 5 refills | Status: DC

## 2024-03-19 MED ORDER — METOLAZONE 2.5 MG PO TABS: 2.5000 mg | ORAL_TABLET | ORAL | 0 refills | Status: DC

## 2024-03-19 NOTE — Progress Notes (Signed)
 Patient refused BMP and BNP to be drawn today

## 2024-03-19 NOTE — Patient Instructions (Signed)
 Medication Changes:  INCREASE TORSEMIDE  TO 80MG  TWICE DAILY  TAKE METOLAZONE 2.5MG  ONCE TODAY AND ONCE TOMORROW   TAKE POTASSIUM 40MEQ ONCE TODAY AND ONCE TOMORROW WITH METOLAZONE DOSE   PLEASE GET LIDOCAINE  PATCHES OVER THE COUNTER  Lab Work:  Labs done today, your results will be available in MyChart, we will contact you for abnormal readings.  Referrals:  PLEASE CALL [phone] TO GET SCHEDULED FOR PHARMACY VISIT   PLEASE CALL YOUR PRIMARY CARE [phone] TO GET SCHEDULED FOR APPOINTMENT   Follow-Up in: NEXT WEEK AS SCHEDULED   At the [facility], you and your health needs are our priority. We have a designated team specialized in the treatment of Heart Failure. This Care Team includes your primary Heart Failure Specialized Cardiologist (physician), Advanced Practice Providers (APPs- Physician Assistants and Nurse Practitioners), and Pharmacist who all work together to provide you with the care you need, when you need it.   You may see any of the following providers on your designated Care Team at your next follow up:  Dr. Toribio Fuel Dr. Ezra Shuck Dr. Odis Brownie Greig Mosses, NP Caffie Shed, [place] [facility] Ms Tibes, [place] Beckey Coe, NP Jordan Lee, NP Tinnie Redman, PharmD   Please be sure to bring in all your medications bottles to every appointment.   Need to Contact Us :  If you have any questions or concerns before your next appointment please send us  a message through [place] or call our office at [phone].    TO LEAVE A MESSAGE FOR THE NURSE SELECT OPTION 2, PLEASE LEAVE A MESSAGE INCLUDING: YOUR NAME DATE OF BIRTH CALL BACK NUMBER REASON FOR CALL**this is important as we prioritize the call backs  YOU WILL RECEIVE A CALL BACK THE SAME DAY AS LONG AS YOU CALL BEFORE 4:00 PM

## 2024-03-20 ENCOUNTER — Other Ambulatory Visit: Payer: Self-pay | Admitting: Family Medicine

## 2024-03-21 ENCOUNTER — Ambulatory Visit: Payer: Self-pay

## 2024-03-21 NOTE — Telephone Encounter (Signed)
 FYI Only or Action Required?: FYI only for provider: ED advised.  Patient was last seen in primary care on 12/25/2023 by Michele Beverley NOVAK, MD.  Called Nurse Triage reporting Neurologic Problem and Blood Sugar Problem.  Symptoms began several weeks ago.  Interventions attempted: Nothing.  Symptoms are: gradually worsening.  Triage Disposition: Go to ED Now (or PCP Triage)  Patient/caregiver understands and will follow disposition?: No, refuses disposition   Copied from CRM #[id number]. Topic: Clinical - Red Word Triage >> Mar 21, 2024 12:24 PM Michele Owens wrote: Red Word that prompted transfer to Nurse Triage: Patient sugar is low and it's been 2 months since she took her blood sugar medication - she's extremely shaky and keeps dropping things >> Mar 21, 2024 12:37 PM Michele Owens wrote: Patient no longer wanted to hold - please call back  Reason for Disposition  Patient sounds very sick or weak to the triager  Answer Assessment - Initial Assessment Questions Pt is upset that Rn is calling so late. She states that if someone cared we would have called earlier and that she was told she'd be called back within an hour. Pt thinks that she is having low blood sugars but has not strips so she has not been able to check her sugar. She is having weakness initially stated in both hands but then said she is dropping things with her hand. Bilateral leg weakness and multiple falls, last one a couple of days ago. Lightheadedness and back pain.  Rn advised given symptoms even though bilateral would recommend ER. Pt states she absolutely is not going to the ER. Rn again gave rationale and asked if RN could call 911. Pt states no. Rn continued to try to explain to patient why she should go, concerns for stroke and other high acuity problems. Pt stated no about 10 times. She said, wait is too long, they just want to take your money, it's too cold outside. RN advised again to go to ER. She states if she chooses to  go later, she will call 911. Rn stated understanding.    1. SYMPTOM: What is the main symptom you are concerned about? (e.g., weakness, numbness)     weakness 2. ONSET: When did this start? (e.g., minutes, hours, days; while sleeping)     Ongoing the last 3-4 months but getting worse 3 PATTERN Does this come and go, or has it been constant since it started?  Is it present now?     Present now 5. CARDIAC SYMPTOMS: Have you had any of the following symptoms: chest pain, difficulty breathing, palpitations?     Didn't get this far due to other symptoms.  6. NEUROLOGIC SYMPTOMS: Have you had any of the following symptoms: headache, dizziness, vision loss, double vision, changes in speech, unsteady on your feet?     Lightheaded, falls- denies hitting head or LOC 7. OTHER SYMPTOMS: Do you have any other symptoms?     Shakiness at times.  Protocols used: Neurologic Deficit-A-AH

## 2024-03-23 ENCOUNTER — Emergency Department (HOSPITAL_COMMUNITY)

## 2024-03-23 ENCOUNTER — Inpatient Hospital Stay (HOSPITAL_COMMUNITY)
Admission: EM | Admit: 2024-03-23 | Discharge: 2024-03-25 | DRG: 194 | Disposition: A | Attending: Hospitalist | Admitting: Hospitalist

## 2024-03-23 ENCOUNTER — Other Ambulatory Visit: Payer: Self-pay

## 2024-03-23 DIAGNOSIS — E785 Hyperlipidemia, unspecified: Secondary | ICD-10-CM | POA: Diagnosis present

## 2024-03-23 DIAGNOSIS — F3132 Bipolar disorder, current episode depressed, moderate: Secondary | ICD-10-CM

## 2024-03-23 DIAGNOSIS — F319 Bipolar disorder, unspecified: Secondary | ICD-10-CM | POA: Diagnosis present

## 2024-03-23 DIAGNOSIS — G8929 Other chronic pain: Secondary | ICD-10-CM | POA: Diagnosis present

## 2024-03-23 DIAGNOSIS — I5032 Chronic diastolic (congestive) heart failure: Secondary | ICD-10-CM | POA: Diagnosis present

## 2024-03-23 DIAGNOSIS — G4700 Insomnia, unspecified: Secondary | ICD-10-CM

## 2024-03-23 DIAGNOSIS — Z87891 Personal history of nicotine dependence: Secondary | ICD-10-CM | POA: Diagnosis not present

## 2024-03-23 DIAGNOSIS — G4733 Obstructive sleep apnea (adult) (pediatric): Secondary | ICD-10-CM | POA: Diagnosis present

## 2024-03-23 DIAGNOSIS — N179 Acute kidney failure, unspecified: Secondary | ICD-10-CM | POA: Diagnosis present

## 2024-03-23 DIAGNOSIS — E66813 Obesity, class 3: Secondary | ICD-10-CM | POA: Diagnosis present

## 2024-03-23 DIAGNOSIS — J9611 Chronic respiratory failure with hypoxia: Secondary | ICD-10-CM

## 2024-03-23 DIAGNOSIS — Z85850 Personal history of malignant neoplasm of thyroid: Secondary | ICD-10-CM | POA: Diagnosis not present

## 2024-03-23 DIAGNOSIS — E890 Postprocedural hypothyroidism: Secondary | ICD-10-CM | POA: Diagnosis present

## 2024-03-23 DIAGNOSIS — F419 Anxiety disorder, unspecified: Secondary | ICD-10-CM

## 2024-03-23 DIAGNOSIS — J189 Pneumonia, unspecified organism: Secondary | ICD-10-CM | POA: Diagnosis not present

## 2024-03-23 DIAGNOSIS — J449 Chronic obstructive pulmonary disease, unspecified: Secondary | ICD-10-CM | POA: Diagnosis not present

## 2024-03-23 DIAGNOSIS — Z1152 Encounter for screening for COVID-19: Secondary | ICD-10-CM | POA: Diagnosis not present

## 2024-03-23 DIAGNOSIS — E119 Type 2 diabetes mellitus without complications: Secondary | ICD-10-CM | POA: Diagnosis present

## 2024-03-23 DIAGNOSIS — R531 Weakness: Secondary | ICD-10-CM | POA: Diagnosis not present

## 2024-03-23 DIAGNOSIS — Z7982 Long term (current) use of aspirin: Secondary | ICD-10-CM | POA: Diagnosis not present

## 2024-03-23 DIAGNOSIS — J440 Chronic obstructive pulmonary disease with acute lower respiratory infection: Secondary | ICD-10-CM | POA: Diagnosis present

## 2024-03-23 DIAGNOSIS — I110 Hypertensive heart disease with heart failure: Secondary | ICD-10-CM | POA: Diagnosis present

## 2024-03-23 DIAGNOSIS — Z6841 Body Mass Index (BMI) 40.0 and over, adult: Secondary | ICD-10-CM

## 2024-03-23 DIAGNOSIS — I471 Supraventricular tachycardia, unspecified: Secondary | ICD-10-CM | POA: Diagnosis present

## 2024-03-23 DIAGNOSIS — Z833 Family history of diabetes mellitus: Secondary | ICD-10-CM | POA: Diagnosis not present

## 2024-03-23 DIAGNOSIS — Z79890 Hormone replacement therapy (postmenopausal): Secondary | ICD-10-CM | POA: Diagnosis not present

## 2024-03-23 DIAGNOSIS — Z7951 Long term (current) use of inhaled steroids: Secondary | ICD-10-CM | POA: Diagnosis not present

## 2024-03-23 DIAGNOSIS — K219 Gastro-esophageal reflux disease without esophagitis: Secondary | ICD-10-CM | POA: Diagnosis present

## 2024-03-23 LAB — CBC WITH DIFFERENTIAL/PLATELET
Abs Immature Granulocytes: 0.09 K/uL — ABNORMAL HIGH (ref 0.00–0.07)
Basophils Absolute: 0.1 K/uL (ref 0.0–0.1)
Basophils Relative: 0 %
Eosinophils Absolute: 0.2 K/uL (ref 0.0–0.5)
Eosinophils Relative: 1 %
HCT: 39.4 % (ref 36.0–46.0)
Hemoglobin: 12.2 g/dL (ref 12.0–15.0)
Immature Granulocytes: 1 %
Lymphocytes Relative: 13 %
Lymphs Abs: 2 K/uL (ref 0.7–4.0)
MCH: 30.5 pg (ref 26.0–34.0)
MCHC: 31 g/dL (ref 30.0–36.0)
MCV: 98.5 fL (ref 80.0–100.0)
Monocytes Absolute: 1.3 K/uL — ABNORMAL HIGH (ref 0.1–1.0)
Monocytes Relative: 9 %
Neutro Abs: 11.5 K/uL — ABNORMAL HIGH (ref 1.7–7.7)
Neutrophils Relative %: 76 %
Platelets: 300 K/uL (ref 150–400)
RBC: 4 MIL/uL (ref 3.87–5.11)
RDW: 13.3 % (ref 11.5–15.5)
WBC: 15.1 K/uL — ABNORMAL HIGH (ref 4.0–10.5)
nRBC: 0 % (ref 0.0–0.2)

## 2024-03-23 LAB — URINALYSIS, ROUTINE W REFLEX MICROSCOPIC
Bilirubin Urine: NEGATIVE
Glucose, UA: NEGATIVE mg/dL
Hgb urine dipstick: NEGATIVE
Ketones, ur: NEGATIVE mg/dL
Leukocytes,Ua: NEGATIVE
Nitrite: NEGATIVE
Protein, ur: NEGATIVE mg/dL
Specific Gravity, Urine: 1.017 (ref 1.005–1.030)
pH: 5 (ref 5.0–8.0)

## 2024-03-23 LAB — TROPONIN I (HIGH SENSITIVITY)
Troponin I (High Sensitivity): 11 ng/L (ref <18)
Troponin I (High Sensitivity): 7 ng/L (ref <18)

## 2024-03-23 LAB — COMPREHENSIVE METABOLIC PANEL WITH GFR
ALT: 22 U/L (ref 0–44)
AST: 33 U/L (ref 15–41)
Albumin: 4.1 g/dL (ref 3.5–5.0)
Alkaline Phosphatase: 50 U/L (ref 38–126)
Anion gap: 13 (ref 5–15)
BUN: 36 mg/dL — ABNORMAL HIGH (ref 8–23)
CO2: 41 mmol/L — ABNORMAL HIGH (ref 22–32)
Calcium: 8.4 mg/dL — ABNORMAL LOW (ref 8.9–10.3)
Chloride: 81 mmol/L — ABNORMAL LOW (ref 98–111)
Creatinine, Ser: 1.3 mg/dL — ABNORMAL HIGH (ref 0.44–1.00)
GFR, Estimated: 46 mL/min — ABNORMAL LOW (ref >60)
Glucose, Bld: 175 mg/dL — ABNORMAL HIGH (ref 70–99)
Potassium: 3.6 mmol/L (ref 3.5–5.1)
Sodium: 135 mmol/L (ref 135–145)
Total Bilirubin: 0.6 mg/dL (ref 0.0–1.2)
Total Protein: 7.4 g/dL (ref 6.5–8.1)

## 2024-03-23 LAB — BRAIN NATRIURETIC PEPTIDE
B Natriuretic Peptide: 8.7 pg/mL (ref 0.0–100.0)
B Natriuretic Peptide: 6.9 pg/mL (ref 0.0–100.0)

## 2024-03-23 LAB — BASIC METABOLIC PANEL WITH GFR
Anion gap: 10 (ref 5–15)
BUN: 35 mg/dL — ABNORMAL HIGH (ref 8–23)
CO2: 43 mmol/L — ABNORMAL HIGH (ref 22–32)
Calcium: 8.9 mg/dL (ref 8.9–10.3)
Chloride: 83 mmol/L — ABNORMAL LOW (ref 98–111)
Creatinine, Ser: 1.36 mg/dL — ABNORMAL HIGH (ref 0.44–1.00)
GFR, Estimated: 44 mL/min — ABNORMAL LOW (ref >60)
Glucose, Bld: 185 mg/dL — ABNORMAL HIGH (ref 70–99)
Potassium: 3.7 mmol/L (ref 3.5–5.1)
Sodium: 136 mmol/L (ref 135–145)

## 2024-03-23 LAB — I-STAT CHEM 8, ED
BUN: 42 mg/dL — ABNORMAL HIGH (ref 8–23)
Calcium, Ion: 1.01 mmol/L — ABNORMAL LOW (ref 1.15–1.40)
Chloride: 81 mmol/L — ABNORMAL LOW (ref 98–111)
Creatinine, Ser: 1.4 mg/dL — ABNORMAL HIGH (ref 0.44–1.00)
Glucose, Bld: 173 mg/dL — ABNORMAL HIGH (ref 70–99)
HCT: 44 % (ref 36.0–46.0)
Hemoglobin: 15 g/dL (ref 12.0–15.0)
Potassium: 3.4 mmol/L — ABNORMAL LOW (ref 3.5–5.1)
Sodium: 137 mmol/L (ref 135–145)
TCO2: 45 mmol/L — ABNORMAL HIGH (ref 22–32)

## 2024-03-23 LAB — RESP PANEL BY RT-PCR (RSV, FLU A&B, COVID)  RVPGX2
Influenza A by PCR: NEGATIVE
Influenza B by PCR: NEGATIVE
Resp Syncytial Virus by PCR: NEGATIVE
SARS Coronavirus 2 by RT PCR: NEGATIVE

## 2024-03-23 MED ORDER — BISACODYL 5 MG PO TBEC: 5.0000 mg | DELAYED_RELEASE_TABLET | Freq: Every day | ORAL | Status: DC | PRN

## 2024-03-23 MED ORDER — SUMATRIPTAN SUCCINATE 100 MG PO TABS: 100.0000 mg | ORAL_TABLET | ORAL | Status: DC | PRN

## 2024-03-23 MED ORDER — LIDOCAINE 5 % EX PTCH
1.0000 | MEDICATED_PATCH | CUTANEOUS | Status: DC
  Administered 2024-03-23 – 2024-03-24 (×2): 1 via TRANSDERMAL
  Filled 2024-03-23 (×2): qty 1

## 2024-03-23 MED ORDER — LACTATED RINGERS IV SOLN: INTRAVENOUS | Status: AC

## 2024-03-23 MED ORDER — ONDANSETRON HCL 4 MG PO TABS: 4.0000 mg | ORAL_TABLET | Freq: Four times a day (QID) | ORAL | Status: DC | PRN

## 2024-03-23 MED ORDER — SODIUM CHLORIDE 0.9 % IV BOLUS
500.0000 mL | Freq: Once | INTRAVENOUS | Status: AC
  Administered 2024-03-23: 500 mL via INTRAVENOUS

## 2024-03-23 MED ORDER — SODIUM CHLORIDE 0.9 % IV SOLN
2.0000 g | Freq: Once | INTRAVENOUS | Status: AC
  Administered 2024-03-23: 2 g via INTRAVENOUS
  Filled 2024-03-23: qty 20

## 2024-03-23 MED ORDER — DOXYCYCLINE HYCLATE 100 MG PO TABS
100.0000 mg | ORAL_TABLET | Freq: Once | ORAL | Status: AC
  Administered 2024-03-23: 100 mg via ORAL
  Filled 2024-03-23: qty 1

## 2024-03-23 MED ORDER — ENOXAPARIN SODIUM 80 MG/0.8ML IJ SOSY
70.0000 mg | PREFILLED_SYRINGE | INTRAMUSCULAR | Status: DC
  Administered 2024-03-23 – 2024-03-24 (×2): 70 mg via SUBCUTANEOUS
  Filled 2024-03-23 (×4): qty 0.7

## 2024-03-23 MED ORDER — LACTATED RINGERS IV BOLUS
500.0000 mL | Freq: Once | INTRAVENOUS | Status: AC
  Administered 2024-03-23: 500 mL via INTRAVENOUS

## 2024-03-23 MED ORDER — SENNOSIDES-DOCUSATE SODIUM 8.6-50 MG PO TABS: 1.0000 | ORAL_TABLET | Freq: Every evening | ORAL | Status: DC | PRN

## 2024-03-23 MED ORDER — ONDANSETRON HCL 4 MG/2ML IJ SOLN: 4.0000 mg | Freq: Four times a day (QID) | INTRAMUSCULAR | Status: DC | PRN

## 2024-03-23 MED ORDER — OXYCODONE HCL 5 MG PO TABS
5.0000 mg | ORAL_TABLET | Freq: Once | ORAL | Status: AC
  Administered 2024-03-23: 5 mg via ORAL
  Filled 2024-03-23: qty 1

## 2024-03-23 MED ORDER — SODIUM CHLORIDE 0.9 % IV SOLN
1.0000 g | Freq: Once | INTRAVENOUS | Status: DC
  Filled 2024-03-23: qty 10

## 2024-03-23 MED ORDER — ACETAMINOPHEN 325 MG PO TABS: 650.0000 mg | ORAL_TABLET | Freq: Four times a day (QID) | ORAL | Status: DC | PRN

## 2024-03-23 MED ORDER — ACETAMINOPHEN 650 MG RE SUPP: 650.0000 mg | Freq: Four times a day (QID) | RECTAL | Status: DC | PRN

## 2024-03-23 MED ORDER — IOHEXOL 350 MG/ML SOLN
100.0000 mL | Freq: Once | INTRAVENOUS | Status: AC | PRN
  Administered 2024-03-23: 100 mL via INTRAVENOUS

## 2024-03-23 NOTE — ED Notes (Signed)
Pt unable to tolerate standing for orthostatic vital signs

## 2024-03-23 NOTE — ED Provider Notes (Signed)
 [place] EMERGENCY DEPARTMENT AT [facility] Provider Note   CSN: [id number] Arrival date & time: 03/23/24  1306     Patient presents with: Chest Pain   Michele Owens is a 63 y.o. female.   HPI 63 year old female presents with multiple complaints.  Patient has a chief complaint of generalized weakness and passing out.  States she has been generally weak for many weeks to months, especially with grip strength as well as legs.  This has been progressive.  She states that whenever she walks for the past couple weeks she has been passing out and getting lightheaded.  Has not injured herself.  She has a chronic migraine headache that is unchanged from baseline.  For the past week she has been having chest pressure continuously, worse at night.  Also having worse shortness of breath than typical at night.  For the past 3 days or so she has been having cough with some mild blood in it.  She has chronic urinary frequency from diuresis as well as bowel movements every time she has to urinate.  She has been having right sided low back pain for many months but does not know why.  No abdominal pain or vomiting.  She states she wears 2 L of oxygen  chronically.  EMS found her to have low blood pressures and low oxygen  though it does not appear she was wearing her oxygen .  However her BP was in the 80s.  Prior to Admission medications  Medication Sig Start Date End Date Taking? Authorizing Provider  ACCU-CHEK GUIDE TEST test strip USE TO CHECK BLOOD SUGAR 3 TIMES DAILY 03/04/24   Webb, Padonda B, FNP  Accu-Chek Softclix Lancets lancets Use to check blood sugar 3 times daily 12/26/23   [name], MD  acetaminophen  (TYLENOL ) 325 MG tablet Take 2 tablets (650 mg total) by mouth every 6 (six) hours as needed for mild pain (pain score 1-3) or fever (or Fever >/= 101). 01/23/24   Arrien, Elidia Sieving, MD  albuterol  (PROVENTIL ) (2.5 MG/3ML) 0.083% nebulizer solution Take 3 mLs (2.5 mg  total) by nebulization every 6 (six) hours as needed for wheezing or shortness of breath. 03/13/24   Donnajean Lynwood DEL, PA-C  albuterol  (VENTOLIN  HFA) 108 (90 Base) MCG/ACT inhaler Inhale 1-2 puffs into the lungs every 6 (six) hours as needed for wheezing or shortness of breath. 12/26/23   [name], MD  allopurinol  (ZYLOPRIM ) 100 MG tablet Take 1 tablet (100 mg total) by mouth daily. 11/13/23 11/07/24  [name], MD  aspirin  EC 81 MG tablet Take 1 tablet (81 mg total) by mouth daily. Swallow whole. 11/13/23 11/07/24  [name], MD  blood glucose meter kit and supplies KIT Dispense based on patient and insurance preference. Use up to four times daily as directed. 12/25/20   Lue Elsie BROCKS, MD  Blood Glucose Monitoring Suppl DEVI 1 each by Does not apply route in the morning, at noon, and at bedtime. May substitute to any manufacturer covered by patient's insurance. 11/13/23   [name], MD  budesonide  (PULMICORT ) 0.25 MG/2ML nebulizer solution Inhale 2 mLs (0.25 mg total) by nebulization 2 (two) times daily. 11/13/23   [name], MD  busPIRone  (BUSPAR ) 30 MG tablet Take 1 tablet (30 mg total) by mouth 2 (two) times daily. 12/05/23 11/29/24  [name], MD  carvedilol  (COREG ) 6.25 MG tablet Take 1 tablet (6.25 mg total) by mouth 2 (two) times daily.  12/26/23   [name], MD  clotrimazole  (LOTRIMIN ) 1 % cream Apply topically 2 (two) times daily. 12/13/23   [name], MD  diclofenac  Sodium (VOLTAREN ) 1 % GEL Apply 4 g topically 4 (four) times daily. 12/15/23   Freddi Hamilton, MD  dicyclomine  (BENTYL ) 20 MG tablet Take 1 tablet (20 mg total) by mouth 2 (two) times daily. 12/26/23   [name], MD  EPINEPHrine  0.3 mg/0.3 mL IJ SOAJ injection Inject 0.3 mg into the muscle once as needed for anaphylaxis.    [provider]  fenofibrate  (TRICOR ) 145 MG tablet Take 1 tablet (145 mg total) by mouth daily. 12/26/23   [name], MD  FEROSUL  325 (65 Fe) MG tablet Take 1 tablet (325 mg total) by mouth daily. 12/26/23   [name], MD  gabapentin  (NEURONTIN ) 100 MG capsule Take 1 capsule 3 times a day by oral route as needed for 30 days. 12/06/23   [provider]  ipratropium-albuterol  (DUONEB) 0.5-2.5 (3) MG/3ML SOLN Take 3 mLs by nebulization every 4 (four) hours as needed. 01/23/24   Arrien, Mauricio Daniel, MD  levocetirizine (XYZAL ) 5 MG tablet TAKE 1 TABLET (5 MG TOTAL) BY MOUTH AT BEDTIME. 12/17/23   [name], MD  levothyroxine  (SYNTHROID ) 125 MCG tablet TAKE 1 TABLET (125 MCG TOTAL) BY MOUTH AT BEDTIME. 03/20/24   [name], MD  lidocaine  (HM LIDOCAINE  PATCH) 4 % Place 1 patch onto the skin daily. 03/13/24   Donnajean Lynwood DEL, PA-C  losartan  (COZAAR ) 25 MG tablet Take 1 tablet (25 mg total) by mouth daily. 11/13/23   [name], MD  lurasidone  (LATUDA ) 20 MG TABS tablet Take 1 tablet (20 mg total) by mouth at bedtime. Take with a snack. This is for mood and sleep. 12/25/23 12/19/24  [name], MD  metolazone  (ZAROXOLYN ) 2.5 MG tablet Take 1 tablet (2.5 mg total) by mouth as directed. TAKE ONE DOSE OF METOLAZONE  2.5MG  TODAY, AND TOMORROW WITH (2) TABLETS OF POTASSIUM WITH THIS 03/19/24   Hayes Beckey CROME, NP  nitroGLYCERIN  (NITROSTAT ) 0.4 MG SL tablet Place 1 tablet (0.4 mg total) under the tongue every 5 (five) minutes as needed for chest pain. 12/25/23   [name], MD  omeprazole  (PRILOSEC) 20 MG capsule TAKE 2 CAPSULES (40 MG TOTAL) BY MOUTH DAILY (2AM) 03/20/24   [name], MD  ondansetron  (ZOFRAN -ODT) 4 MG disintegrating tablet Take 1 tablet (4 mg total) by mouth every 8 (eight) hours as needed for nausea or vomiting. 12/26/23   [name], MD  OXYGEN  Inhale 2 L into the lungs continuous.    [provider]  potassium chloride  (KLOR-CON  M) 10 MEQ tablet Take 2 tablets (20 mEq total) by mouth daily. 03/03/24   Rolan Ezra RAMAN, MD  Rimegepant Sulfate (NURTEC)  75 MG TBDP Take 1 tablet (75 mg total) by mouth every other day. Prevents and Treats Migraine Headaches 12/25/23 12/19/24  [name], MD  rizatriptan  (MAXALT ) 10 MG tablet Take 1 tablet (10 mg total) by mouth as needed for migraine. May repeat in 2 hours if needed 01/23/24   Arrien, Elidia Sieving, MD  rosuvastatin  (CRESTOR ) 10 MG tablet Take 1 tablet (10 mg total) by mouth every evening. 11/13/23   [name], MD  spironolactone  (ALDACTONE ) 25 MG tablet Take 0.5 tablets (12.5 mg total) by mouth daily. 01/23/24   Arrien, Elidia Sieving, MD  torsemide  (DEMADEX ) 20 MG tablet Take 4  tablets (80 mg total) by mouth 2 (two) times daily. 03/19/24   Hayes Beckey CROME, NP  umeclidinium-vilanterol (ANORO ELLIPTA ) 62.5-25 MCG/ACT AEPB Inhale 1 puff into the lungs daily. 01/02/24   [name], MD  Vitamin D , Ergocalciferol , (DRISDOL ) 1.25 MG (50000 UNIT) CAPS capsule Take 1 capsule (50,000 Units total) by mouth every 7 (seven) days. 11/13/23   [name], MD    Allergies: Bee venom, Fioricet  [butalbital -apap-caffeine ], Ibuprofen , Lamisil  [terbinafine ], and Nsaids    Review of Systems  Constitutional:  Negative for fever.  Respiratory:  Positive for cough and shortness of breath.   Cardiovascular:  Positive for chest pain.  Gastrointestinal:  Negative for abdominal pain.  Genitourinary:  Negative for dysuria.  Musculoskeletal:  Positive for back pain.  Neurological:  Positive for weakness, light-headedness and headaches.    Updated Vital Signs BP (!) 117/57 (BP Location: Right Arm)   Pulse 82   Temp 98.6 F (37 C) (Oral)   Resp 19   Ht 5' 5 (1.651 m)   Wt (!) 143.8 kg   SpO2 94%   BMI 52.75 kg/m   Physical Exam Vitals and nursing note reviewed.  Constitutional:      Appearance: She is well-developed. She is obese.  HENT:     Head: Normocephalic and atraumatic.  Cardiovascular:     Rate and Rhythm: Normal rate and regular rhythm.     Heart sounds: Normal heart sounds.   Pulmonary:     Effort: Pulmonary effort is normal. No tachypnea or accessory muscle usage.     Breath sounds: Examination of the right-lower field reveals decreased breath sounds. Examination of the left-lower field reveals decreased breath sounds. Decreased breath sounds (mild) present. No wheezing.  Abdominal:     Palpations: Abdomen is soft.     Tenderness: There is no abdominal tenderness.  Musculoskeletal:     Lumbar back: Tenderness present.       Back:     Right lower leg: No edema.     Left lower leg: No edema.  Skin:    General: Skin is warm and dry.  Neurological:     Mental Status: She is alert.     Comments: 5/5 strength in BUE. 4/5 strength in BLE. No facial droop. Clear speech.     (all labs ordered are listed, but only abnormal results are displayed) Labs Reviewed  RESP PANEL BY RT-PCR (RSV, FLU A&B, COVID)  [id number]  COMPREHENSIVE METABOLIC PANEL WITH GFR  BRAIN NATRIURETIC PEPTIDE  CBC WITH DIFFERENTIAL/PLATELET  URINALYSIS, ROUTINE W REFLEX MICROSCOPIC  I-STAT CHEM 8, ED  TROPONIN I (HIGH SENSITIVITY)    EKG: EKG Interpretation Date/Time:  Sunday March 23 2024 13:17:30 EST Ventricular Rate:  84 PR Interval:  168 QRS Duration:  80 QT Interval:  456 QTC Calculation: 540 R Axis:   45  Text Interpretation: Sinus rhythm Low voltage, precordial leads Borderline T abnormalities, anterior leads Prolonged QT interval similar to Mar 12 2024 Confirmed by Freddi Hamilton [phone]) on 03/23/2024 1:20:36 PM  Radiology: No results found.   Procedures   Medications Ordered in the ED  lactated ringers  bolus 500 mL (has no administration in time range)                                    Medical Decision Making Amount and/or Complexity of Data Reviewed Labs: ordered. Radiology: ordered and independent interpretation performed.  Details: No pneumonia ECG/medicine tests: ordered and independent interpretation performed.    Details: No  ischemia  Risk Prescription drug management.   Patient presents with multiple complaints.  She was hypotensive in the field but not now.  Apparently wears 2 L of oxygen  at home and is currently on that.  She will need a CTA given the negative chest x-ray as well as report of some hemoptysis.  Will give some gentle fluids given her diastolic heart failure history but currently labs and full workup pending.  Care transferred to Dr. Cottie.     Final diagnoses:  None    ED Discharge Orders     None          Freddi Hamilton, MD 03/23/24 [phone]

## 2024-03-23 NOTE — ED Triage Notes (Signed)
 Pt bib [facility] from home c/o chest pain and gen weakness. Low bp. Given 324 of aspirin . Wears 2L O2 at home  Initially 80/60 last bp 95/62 83% ra, 93% NRB 20 G lac 185 cbg

## 2024-03-23 NOTE — H&P (Signed)
 History and Physical  Michele Owens FMW:[id number] DOB: May 26, 1960 DOA: 03/23/2024  PCP: [name], MD   Chief Complaint: Chest pain, generalized weakness  HPI: Michele Owens is a 63 y.o. female with medical history significant for morbid obesity, HTN, OSA, COPD, chronic hypoxic respiratory failure on 2 L [place], diastolic HF, gout, bipolar, SVT, migraines, GERD, history of DVT, anxiety and depression who presented to the ED for evaluation of chest pain and generalized weakness.  Patient reports that she has been falling recently due to her legs giving out on her while ambulating. Last night, she had shortness of breath and chest discomfort that did not improved this morning. She reports increased cough with production of blood-tinged sputum over the last few days. She also endorsed some back pain and migraine headache but denies any fevers, chills, abdominal pain, nausea, vomiting, leg swelling or visual changes. Reports that she was not responding well to her torsemide  so the dose was increased by her heart failure team last week.  ED Course: Initial vitals show patient afebrile, RR 14-22, HR 70-80s, SBP 90-110s, SpO2 94% on 2 L. Initial labs significant for BUNs/creatinine 36/1.30, glucose 175, WBC 15.1, UA unremarkable. EKG shows sinus rhythm with prolonged QTc of 540. CXR shows no active disease.  CTA PE study shows pleural patchy opacities inferiorly in the right upper lobe.  CT A/P with no acute intra-abdominal pathology.  Pt received IV Rocephin , oral doxycycline , oxycodone , IV LR 500 cc bolus and IV NS 500 cc bolus. TRH was consulted for admission.   Review of Systems: Please see HPI for pertinent positives and negatives. A complete 10 system review of systems are otherwise negative.  Past Medical History:  Diagnosis Date   Anxiety    Arthritis    Asthma    Chronic diastolic CHF (congestive heart failure) (HCC)    COPD (chronic obstructive pulmonary disease) (HCC)    Uses  Oxygen  at night   Depression    Diabetes mellitus without complication (HCC)    GERD (gastroesophageal reflux disease)    Gout    Headache    migraines   History of DVT of lower extremity    Hypertension    Morbid obesity with BMI of 45.0-49.9, adult (HCC)    Thyroid  cancer (HCC) 09/23/2016   Past Surgical History:  Procedure Laterality Date   BUNIONECTOMY Bilateral    CARDIAC CATHETERIZATION N/A 06/23/2015   Procedure: Right/Left Heart Cath and Coronary Angiography;  Surgeon: Ezra GORMAN Shuck, MD;  Location: [facility] INVASIVE CV LAB;  Service: Cardiovascular;  Laterality: N/A;   COLONOSCOPY WITH PROPOFOL  N/A 12/15/2015   Procedure: COLONOSCOPY WITH PROPOFOL ;  Surgeon: Norleen Hint, MD;  Location: [facility] ENDOSCOPY;  Service: Endoscopy;  Laterality: N/A;   RIGHT HEART CATH N/A 10/01/2019   Procedure: RIGHT HEART CATH;  Surgeon: Shuck Ezra GORMAN, MD;  Location: [facility] INVASIVE CV LAB;  Service: Cardiovascular;  Laterality: N/A;   RIGHT HEART CATH N/A 03/03/2024   Procedure: RIGHT HEART CATH;  Surgeon: Shuck Ezra GORMAN, MD;  Location: [facility] INVASIVE CV LAB;  Service: Cardiovascular;  Laterality: N/A;   THYROIDECTOMY  09/19/2016   THYROIDECTOMY N/A 09/19/2016   Procedure: TOTAL THYROIDECTOMY;  Surgeon: Eletha Boas, MD;  Location: [facility] OR;  Service: General;  Laterality: N/A;   TONSILLECTOMY     TOTAL ABDOMINAL HYSTERECTOMY  07/14/10   Social History:  reports that she quit smoking about 7 years ago. Her smoking use included cigarettes. She started smoking about 48 years ago. She has  a 20.5 pack-year smoking history. She has never used smokeless tobacco. She reports that she does not currently use alcohol. She reports that she does not currently use drugs after having used the following drugs: Marijuana and Cocaine.  Allergies[1]  Family History  Problem Relation Age of Onset   Cancer Father        thought to be due to exposure to concrete   Diabetes Mother    Thyroid  cancer Mother        dx in her 14s-60s    Cancer Maternal Uncle        2 uncles with cancer NOS   Brain cancer Paternal Aunt    Cancer Cousin        maternal first cousin - NOS   Cancer Cousin        maternal first cousin - NOS     Prior to Admission medications  Medication Sig Start Date End Date Taking? Authorizing Provider  ACCU-CHEK GUIDE TEST test strip USE TO CHECK BLOOD SUGAR 3 TIMES DAILY 03/04/24   Webb, Padonda B, FNP  Accu-Chek Softclix Lancets lancets Use to check blood sugar 3 times daily 12/26/23   [name], MD  acetaminophen  (TYLENOL ) 325 MG tablet Take 2 tablets (650 mg total) by mouth every 6 (six) hours as needed for mild pain (pain score 1-3) or fever (or Fever >/= 101). 01/23/24   Arrien, Elidia Sieving, MD  albuterol  (PROVENTIL ) (2.5 MG/3ML) 0.083% nebulizer solution Take 3 mLs (2.5 mg total) by nebulization every 6 (six) hours as needed for wheezing or shortness of breath. 03/13/24   Donnajean Lynwood DEL, PA-C  albuterol  (VENTOLIN  HFA) 108 (90 Base) MCG/ACT inhaler Inhale 1-2 puffs into the lungs every 6 (six) hours as needed for wheezing or shortness of breath. 12/26/23   [name], MD  allopurinol  (ZYLOPRIM ) 100 MG tablet Take 1 tablet (100 mg total) by mouth daily. 11/13/23 11/07/24  [name], MD  aspirin  EC 81 MG tablet Take 1 tablet (81 mg total) by mouth daily. Swallow whole. 11/13/23 11/07/24  [name], MD  blood glucose meter kit and supplies KIT Dispense based on patient and insurance preference. Use up to four times daily as directed. 12/25/20   Lue Elsie BROCKS, MD  Blood Glucose Monitoring Suppl DEVI 1 each by Does not apply route in the morning, at noon, and at bedtime. May substitute to any manufacturer covered by patient's insurance. 11/13/23   [name], MD  budesonide  (PULMICORT ) 0.25 MG/2ML nebulizer solution Inhale 2 mLs (0.25 mg total) by nebulization 2 (two) times daily. 11/13/23   [name], MD  busPIRone  (BUSPAR ) 30 MG tablet Take 1 tablet (30 mg total)  by mouth 2 (two) times daily. 12/05/23 11/29/24  [name], MD  carvedilol  (COREG ) 6.25 MG tablet Take 1 tablet (6.25 mg total) by mouth 2 (two) times daily. 12/26/23   [name], MD  clotrimazole  (LOTRIMIN ) 1 % cream Apply topically 2 (two) times daily. 12/13/23   [name], MD  diclofenac  Sodium (VOLTAREN ) 1 % GEL Apply 4 g topically 4 (four) times daily. 12/15/23   Freddi Hamilton, MD  dicyclomine  (BENTYL ) 20 MG tablet Take 1 tablet (20 mg total) by mouth 2 (two) times daily. 12/26/23   [name], MD  EPINEPHrine  0.3 mg/0.3 mL IJ SOAJ injection Inject 0.3 mg into the muscle once as needed for anaphylaxis.    [provider]  fenofibrate  (TRICOR ) 145 MG tablet Take  1 tablet (145 mg total) by mouth daily. 12/26/23   [name], MD  FEROSUL 325 (65 Fe) MG tablet Take 1 tablet (325 mg total) by mouth daily. 12/26/23   [name], MD  gabapentin  (NEURONTIN ) 100 MG capsule Take 1 capsule 3 times a day by oral route as needed for 30 days. 12/06/23   [provider]  ipratropium-albuterol  (DUONEB) 0.5-2.5 (3) MG/3ML SOLN Take 3 mLs by nebulization every 4 (four) hours as needed. 01/23/24   Arrien, Mauricio Daniel, MD  levocetirizine (XYZAL ) 5 MG tablet TAKE 1 TABLET (5 MG TOTAL) BY MOUTH AT BEDTIME. 12/17/23   [name], MD  levothyroxine  (SYNTHROID ) 125 MCG tablet TAKE 1 TABLET (125 MCG TOTAL) BY MOUTH AT BEDTIME. 03/20/24   [name], MD  lidocaine  (HM LIDOCAINE  PATCH) 4 % Place 1 patch onto the skin daily. 03/13/24   Donnajean Lynwood DEL, PA-C  losartan  (COZAAR ) 25 MG tablet Take 1 tablet (25 mg total) by mouth daily. 11/13/23   [name], MD  lurasidone  (LATUDA ) 20 MG TABS tablet Take 1 tablet (20 mg total) by mouth at bedtime. Take with a snack. This is for mood and sleep. 12/25/23 12/19/24  [name], MD  metolazone  (ZAROXOLYN ) 2.5 MG tablet Take 1 tablet (2.5 mg total) by mouth as directed. TAKE ONE DOSE OF METOLAZONE  2.5MG   TODAY, AND TOMORROW WITH (2) TABLETS OF POTASSIUM WITH THIS 03/19/24   Hayes Beckey CROME, NP  nitroGLYCERIN  (NITROSTAT ) 0.4 MG SL tablet Place 1 tablet (0.4 mg total) under the tongue every 5 (five) minutes as needed for chest pain. 12/25/23   [name], MD  omeprazole  (PRILOSEC) 20 MG capsule TAKE 2 CAPSULES (40 MG TOTAL) BY MOUTH DAILY (2AM) 03/20/24   [name], MD  ondansetron  (ZOFRAN -ODT) 4 MG disintegrating tablet Take 1 tablet (4 mg total) by mouth every 8 (eight) hours as needed for nausea or vomiting. 12/26/23   [name], MD  OXYGEN  Inhale 2 L into the lungs continuous.    [provider]  potassium chloride  (KLOR-CON  M) 10 MEQ tablet Take 2 tablets (20 mEq total) by mouth daily. 03/03/24   Rolan Ezra RAMAN, MD  Rimegepant Sulfate (NURTEC) 75 MG TBDP Take 1 tablet (75 mg total) by mouth every other day. Prevents and Treats Migraine Headaches 12/25/23 12/19/24  [name], MD  rizatriptan  (MAXALT ) 10 MG tablet Take 1 tablet (10 mg total) by mouth as needed for migraine. May repeat in 2 hours if needed 01/23/24   Arrien, Elidia Sieving, MD  rosuvastatin  (CRESTOR ) 10 MG tablet Take 1 tablet (10 mg total) by mouth every evening. 11/13/23   [name], MD  spironolactone  (ALDACTONE ) 25 MG tablet Take 0.5 tablets (12.5 mg total) by mouth daily. 01/23/24   Arrien, Mauricio Daniel, MD  torsemide  (DEMADEX ) 20 MG tablet Take 4 tablets (80 mg total) by mouth 2 (two) times daily. 03/19/24   Hayes Beckey CROME, NP  umeclidinium-vilanterol (ANORO ELLIPTA ) 62.5-25 MCG/ACT AEPB Inhale 1 puff into the lungs daily. 01/02/24   [name], MD  Vitamin D , Ergocalciferol , (DRISDOL ) 1.25 MG (50000 UNIT) CAPS capsule Take 1 capsule (50,000 Units total) by mouth every 7 (seven) days. 11/13/23   [name], MD    Physical Exam: BP (!) 108/59   Pulse 78   Temp 98.5 F (36.9 C) (Oral)   Resp 15   Ht 5' 5 (1.651 m)   Wt (!) 143.8 kg  SpO2 93%   BMI 52.75 kg/m   General: Pleasant, morbidly obese woman laying in bed. No acute distress. HEENT: [place]/AT. Anicteric sclera. CV: RRR. No murmurs, rubs, or gallops. No LE edema Pulmonary: On 2 L [place]. Lungs CTAB. Normal effort.  Decreased breath sounds throughout Abdominal: Soft, nontender, nondistended. Normal bowel sounds. Extremities: Palpable radial and DP pulses. Normal ROM. Skin: Warm and dry. No obvious rash or lesions. Neuro: A&Ox3. Moves all extremities. Normal sensation to light touch. No focal deficit. Psych: Normal mood and affect          Labs on Admission:  Basic Metabolic Panel: Recent Labs  Lab 03/19/24 1318 03/23/24 1344 03/23/24 1528  NA 136 135 137  K 3.7 3.6 3.4*  CL 83* 81* 81*  CO2 43* 41*  --   GLUCOSE 185* 175* 173*  BUN 35* 36* 42*  CREATININE 1.36* 1.30* 1.40*  CALCIUM  8.9 8.4*  --    Liver Function Tests: Recent Labs  Lab 03/23/24 1344  AST 33  ALT 22  ALKPHOS 50  BILITOT 0.6  PROT 7.4  ALBUMIN  4.1   No results for input(s): LIPASE, AMYLASE in the last 168 hours. No results for input(s): AMMONIA  in the last 168 hours. CBC: Recent Labs  Lab 03/23/24 1522 03/23/24 1528  WBC 15.1*  --   NEUTROABS 11.5*  --   HGB 12.2 15.0  HCT 39.4 44.0  MCV 98.5  --   PLT 300  --    Cardiac Enzymes: No results for input(s): CKTOTAL, CKMB, CKMBINDEX, TROPONINI in the last 168 hours. BNP (last 3 results) Recent Labs    03/12/24 2311 03/19/24 1318 03/23/24 1344  BNP 12.1 8.7 6.9    ProBNP (last 3 results) Recent Labs    01/16/24 2043  PROBNP 183.0    CBG: No results for input(s): GLUCAP in the last 168 hours.  Radiological Exams on Admission: CT Angio Chest PE W and/or Wo Contrast Result Date: 03/23/2024 EXAM: CTA of the Chest with contrast for PE 03/23/2024 05:16:34 PM TECHNIQUE: CTA of the chest was performed without and with the administration of 100 mL of iohexol  (OMNIPAQUE ) 350 MG/ML injection. Multiplanar reformatted images are  provided for review. MIP images are provided for review. Automated exposure control, iterative reconstruction, and/or weight based adjustment of the mA/kV was utilized to reduce the radiation dose to as low as reasonably achievable. COMPARISON: CTA chest dated 01/16/2024 and CT chest dated 12/13/2022. CLINICAL HISTORY: Pulmonary embolism (PE) suspected, high prob. FINDINGS: PULMONARY ARTERIES: Pulmonary arteries are adequately opacified for evaluation. No pulmonary embolism. Enlargement of the main pulmonary artery, suggesting pulmonary arterial hypertension. MEDIASTINUM: Stable moderate cardiomegaly. Thoracic aortic atherosclerosis. The pericardium demonstrates no acute abnormality. LYMPH NODES: No mediastinal, hilar or axillary lymphadenopathy. LUNGS AND PLEURA: 10 mm right middle lobe nodule (image 49), similar versus minimally progressive from recent CT, previously 4 mm in 2024. Mild linear and patchy opacities in the inferior right middle lobe, lingula, and bilateral lower lobes, favoring atelectasis/scarring. Additional subpleural patchy opacities inferiorly in the right upper lobe (image 42), suggesting mild infection/pneumonia, or possibly postinfectious/inflammatory scarring. No pleural effusion or pneumothorax. UPPER ABDOMEN: Limited images of the upper abdomen are unremarkable. SOFT TISSUES AND BONES: Degenerative changes of the visualized thoracolumbar spine. No acute soft tissue abnormality. IMPRESSION: 1. No pulmonary embolism. 2. 10 mm right middle lobe nodule, progressive from 2024, suspicious. Consider PET-CT or 3 month follow-up CT chest for further evaluation. 3. Subpleural patchy opacities inferiorly in the right upper lobe, which  may reflect mild infection or postinfectious/inflammatory scarring. 4. Additional ancillary findings, as above. Electronically signed by: Pinkie Pebbles MD 03/23/2024 06:04 PM EST RP Workstation: [id number]   CT ABDOMEN PELVIS W CONTRAST Result Date:  03/23/2024 EXAM: CT ABDOMEN AND PELVIS WITH CONTRAST 03/23/2024 05:16:34 PM TECHNIQUE: CT of the abdomen and pelvis was performed with the administration of intravenous contrast. Multiplanar reformatted images are provided for review. Automated exposure control, iterative reconstruction, and/or weight-based adjustment of the mA/kV was utilized to reduce the radiation dose to as low as reasonably achievable. COMPARISON: CT abdomen and pelvis 01/16/2024. CLINICAL HISTORY: Abdominal/flank pain, stone suspected. FINDINGS: LOWER CHEST: Atelectasis in the lung bases. There is a 9 mm right middle lobe nodule which is unchanged from prior. LIVER: The liver is enlarged with diffuse fatty infiltration. GALLBLADDER AND BILE DUCTS: Gallbladder is unremarkable. No biliary ductal dilatation. SPLEEN: No acute abnormality. PANCREAS: There is a 9 mm focal hypodensity within the neck of the pancreas image 3/30 which appears unchanged. ADRENAL GLANDS: No acute abnormality. KIDNEYS, URETERS AND BLADDER: No stones in the kidneys or ureters. No hydronephrosis. No perinephric or periureteral stranding. Urinary bladder is unremarkable. GI AND BOWEL: Stomach demonstrates no acute abnormality. The appendix appears normal. There is no bowel obstruction. PERITONEUM AND RETROPERITONEUM: No ascites. No free air. VASCULATURE: Aorta is normal in caliber. LYMPH NODES: No lymphadenopathy. REPRODUCTIVE ORGANS: The uterus is surgically absent. BONES AND SOFT TISSUES: No acute osseous abnormality. No focal soft tissue abnormality. IMPRESSION: 1. No acute findings in the abdomen or pelvis. 2. Unchanged 9 mm right middle lobe pulmonary nodule; no routine follow-up recommended per Fleischner Society Guidelines. 3. 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 Electronically signed by: Greig Pique MD 03/23/2024 05:53 PM EST RP Workstation: [id number]    DG Chest Portable 1 View Result Date: 03/23/2024 CLINICAL DATA:  dyspnea, cough EXAM: PORTABLE CHEST 1 VIEW COMPARISON:  March 12, 2024 fall January 20, 2024 FINDINGS: Evaluation is limited by underpenetration and positioning. The cardiomediastinal silhouette is unchanged enlarged in contour.Low lung volumes. No pleural effusion. No pneumothorax. No acute pleuroparenchymal abnormality. Neck surgical clips. IMPRESSION: No acute cardiopulmonary abnormality. Electronically Signed   By: Corean Salter M.D.   On: 03/23/2024 14:27   Assessment/Plan Michele Owens is a 63 y.o. female with medical history significant for morbid obesity, HTN, OSA, COPD, chronic hypoxic respiratory failure on 2 L [place], diastolic HF, gout, bipolar, SVT, migraines, GERD, history of DVT, anxiety and depression who presented to the ED for evaluation of chest pain and generalized weakness and admitted for community-acquired pneumonia.  # Community-acquired pneumonia - Pt presented with 2 days of chest discomfort, productive cough and generalized weakness - Chest imaging shows signs of patchy opacities inferiorly in the right upper lobe - Continue IV Rocephin  and doxycycline  - Mucinex  DM twice daily - Follow-up blood culture and sputum culture - Check MRSA screen, full RVP, procalcitonin, urinary Legionella and strep pneumo - Trend CBC, fever curve - Incentive spirometer, flutter valve - Supplemental O2 as needed  # AKI - Creatinine slightly elevated to 1.30 from recent baseline of 0.8-0.9 - Likely secondary to slight overdiuresis - Start IV NS 75 cc/h for 10 hours - Hold losartan , torsemide  and spironolactone  today in the setting of AKI - Resume meds when appropriate  # HTN - BP soft with SBP in the 90-110s - Holding BP meds as above  #  T2DM, well-controlled - Last A1c 6.9% 2 months ago - Carb modified diet  # HFpEF - Patient slightly dry on exam with soft BP - Torsemide  recently increased from 60 mg  to 80 mg twice daily - Hold torsemide  for now and resume when appropriate  # COPD # Chronic hypoxic respiratory failure - Respiratory status remained stable, remains on home 2 L with no signs of acute COPD exacerbation - Continue Anoro Ellipta , and PRN DuoNebs - Continue supplemental O2  # Hx of SVT - EKG shows normal sinus rhythm, HR stable in the 70s to 80s - Resume Coreg  tomorrow  # HLD -Continue rosuvastatin   # Prolonged Qtc - EKG on admission shows prolonged QTc of 540 - Avoid QTc prolonging meds - Trend and repeat  electrolytes - Telemetry  # Hypothyroidism - Continue Synthroid   # GERD - Continue PPI  # Allergic rhinitis - Continue Xyzal   # Bipolar disorder # Anxiety and depression - Continue BuSpar  and Latuda   # Hx of migraine pains - Continue as needed Imitrex   # OSA - Continue supplemental O2 at bedtime  # Generalized weakness - In the setting of acute illness - PT/OT eval and treat  # Hx of DVT - Not on anticoagulation  # Class III obesity Body mass index is 52.75 kg/m. Filed Weights   03/23/24 1311  Weight: (!) 143.8 kg  - F/u with PCP for weight lost and nutrition counseling  DVT prophylaxis: Lovenox      Code Status: Full Code  Consults called: None  Family Communication: No family at bedside  Severity of Illness: The appropriate patient status for this patient is INPATIENT. Inpatient status is judged to be reasonable and necessary in order to provide the required intensity of service to ensure the patient's safety. The patient's presenting symptoms, physical exam findings, and initial radiographic and laboratory data in the context of their chronic comorbidities is felt to place them at high risk for further clinical deterioration. Furthermore, it is not anticipated that the patient will be medically stable for discharge from the hospital within 2 midnights of admission.   * I certify that at the point of admission it is my clinical  judgment that the patient will require inpatient hospital care spanning beyond 2 midnights from the point of admission due to high intensity of service, high risk for further deterioration and high frequency of surveillance required.*  Level of care: Telemetry   I personally spent a total of 75 minutes in the care of the patient today including preparing to see the patient, getting/reviewing separately obtained history, performing a medically appropriate exam/evaluation, placing orders, and documenting clinical information in the EHR.   Lou Claretta HERO, MD 03/24/2024, 12:01 AM [facility] Pager: [phone] Isaiah [phone]   If 7PM-7AM, please contact night-coverage [email or web address] Password [email or web address]     [1]  Allergies Allergen Reactions   Bee Venom Anaphylaxis    All over my body (swelling)   Fioricet  [Butalbital -Apap-Caffeine ] Nausea And Vomiting and Rash   Ibuprofen  Rash and Other (See Comments)    Severe rash   Lamisil  [Terbinafine ] Rash and Other (See Comments)    Pt states this causes her to feel funny   Nsaids Other (See Comments)    Per MD's orders

## 2024-03-23 NOTE — ED Provider Notes (Signed)
 63 yo female here chest pain, weakness, multiple complaints   Physical Exam  BP 97/67   Pulse 80   Temp 98.6 F (37 C) (Oral)   Resp (!) 22   Ht 5' 5 (1.651 m)   Wt (!) 143.8 kg   SpO2 92%   BMI 52.75 kg/m   Physical Exam  Procedures  Procedures  ED Course / MDM    Medical Decision Making Amount and/or Complexity of Data Reviewed Labs: ordered. Radiology: ordered.  Risk Prescription drug management. Decision regarding hospitalization.   Workup personally reviewed.  There is concern for potential infectious finding in right upper lung.  Coupled with the patient's increasing leukocytosis, report of worsening dyspnea, I think this is clinically reasonable to treat as a pneumonia.  I have ordered Rocephin  and doxycycline  for her.  Her blood pressures tend to run on the lower side but have been somewhat lower than normal, and I have ordered a small fluid bolus.  Low suspicion for septic shock or acute pulmonary embolism, no PE noted on CT imaging.  Abdominal imaging otherwise unremarkable.  She is on 2-3L [place] which is roughly her baseline requirement of oxygen   Troponin levels negative.  Doubt ACS.       Cottie Donnice PARAS, MD 03/23/24 [phone]

## 2024-03-23 NOTE — H&P (Incomplete)
 History and Physical  Michele Owens FMW:[id number] DOB: Apr 30, 1960 DOA: 03/23/2024  PCP: [name], MD   Chief Complaint: Chest pain, generalized weakness  HPI: Michele Owens is a 63 y.o. female with medical history significant for morbid obesity, HTN, OSA, COPD, chronic hypoxic respiratory failure on 2 L [place], diastolic HF, gout, bipolar, SVT, migraines, GERD, history of DVT, anxiety and depression who presented to the ED for evaluation of chest pain and generalized weakness.  Patient reports that she has been falling recently due to her legs giving out on her while ambulating. Last night, she had shortness of breath and chest discomfort that did not improved this morning. She reports increased cough with production of blood-tinged sputum over the last few days. She also endorsed some back pain and migraine headache but denies any fevers, chills, abdominal pain, nausea, vomiting, leg swelling or visual changes. Reports that she was not responding well to her torsemide  so the dose was increased by her heart failure team last week.  ED Course: Initial vitals show patient afebrile, RR 14-22, HR 70-80s, SBP 90-110s, SpO2 94% on 2 L. Initial labs significant for BUNs/creatinine 36/1.30, glucose 175, WBC 15.1, UA unremarkable. EKG shows sinus rhythm with prolonged QTc of 540. CXR shows no active disease.  CTA PE study shows pleural patchy opacities inferiorly in the right upper lobe.  CT A/P with no acute intra-abdominal pathology.  Pt received IV Rocephin , oral doxycycline , oxycodone , IV LR 500 cc bolus and IV NS 500 cc bolus. TRH was consulted for admission.   Review of Systems: Please see HPI for pertinent positives and negatives. A complete 10 system review of systems are otherwise negative.  Past Medical History:  Diagnosis Date   Anxiety    Arthritis    Asthma    Chronic diastolic CHF (congestive heart failure) (HCC)    COPD (chronic obstructive pulmonary disease) (HCC)     Uses Oxygen  at night   Depression    Diabetes mellitus without complication (HCC)    GERD (gastroesophageal reflux disease)    Gout    Headache    migraines   History of DVT of lower extremity    Hypertension    Morbid obesity with BMI of 45.0-49.9, adult (HCC)    Thyroid  cancer (HCC) 09/23/2016   Past Surgical History:  Procedure Laterality Date   BUNIONECTOMY Bilateral    CARDIAC CATHETERIZATION N/A 06/23/2015   Procedure: Right/Left Heart Cath and Coronary Angiography;  Surgeon: Ezra GORMAN Shuck, MD;  Location: [facility] INVASIVE CV LAB;  Service: Cardiovascular;  Laterality: N/A;   COLONOSCOPY WITH PROPOFOL  N/A 12/15/2015   Procedure: COLONOSCOPY WITH PROPOFOL ;  Surgeon: Norleen Hint, MD;  Location: [facility] ENDOSCOPY;  Service: Endoscopy;  Laterality: N/A;   RIGHT HEART CATH N/A 10/01/2019   Procedure: RIGHT HEART CATH;  Surgeon: Shuck Ezra GORMAN, MD;  Location: [facility] INVASIVE CV LAB;  Service: Cardiovascular;  Laterality: N/A;   RIGHT HEART CATH N/A 03/03/2024   Procedure: RIGHT HEART CATH;  Surgeon: Shuck Ezra GORMAN, MD;  Location: [facility] INVASIVE CV LAB;  Service: Cardiovascular;  Laterality: N/A;   THYROIDECTOMY  09/19/2016   THYROIDECTOMY N/A 09/19/2016   Procedure: TOTAL THYROIDECTOMY;  Surgeon: Eletha Boas, MD;  Location: [facility] OR;  Service: General;  Laterality: N/A;   TONSILLECTOMY     TOTAL ABDOMINAL HYSTERECTOMY  07/14/10   Social History:  reports that she quit smoking about 7 years ago. Her smoking use included cigarettes. She started smoking about 48 years ago. She has  a 20.5 pack-year smoking history. She has never used smokeless tobacco. She reports that she does not currently use alcohol. She reports that she does not currently use drugs after having used the following drugs: Marijuana and Cocaine.  Allergies[1]  Family History  Problem Relation Age of Onset   Cancer Father        thought to be due to exposure to concrete   Diabetes Mother    Thyroid  cancer Mother         dx in her 63s-60s   Cancer Maternal Uncle        2 uncles with cancer NOS   Brain cancer Paternal Aunt    Cancer Cousin        maternal first cousin - NOS   Cancer Cousin        maternal first cousin - NOS     Prior to Admission medications  Medication Sig Start Date End Date Taking? Authorizing Provider  ACCU-CHEK GUIDE TEST test strip USE TO CHECK BLOOD SUGAR 3 TIMES DAILY 03/04/24   Webb, Padonda B, FNP  Accu-Chek Softclix Lancets lancets Use to check blood sugar 3 times daily 12/26/23   [name], MD  acetaminophen  (TYLENOL ) 325 MG tablet Take 2 tablets (650 mg total) by mouth every 6 (six) hours as needed for mild pain (pain score 1-3) or fever (or Fever >/= 101). 01/23/24   Arrien, Elidia Sieving, MD  albuterol  (PROVENTIL ) (2.5 MG/3ML) 0.083% nebulizer solution Take 3 mLs (2.5 mg total) by nebulization every 6 (six) hours as needed for wheezing or shortness of breath. 03/13/24   Donnajean Lynwood DEL, PA-C  albuterol  (VENTOLIN  HFA) 108 (90 Base) MCG/ACT inhaler Inhale 1-2 puffs into the lungs every 6 (six) hours as needed for wheezing or shortness of breath. 12/26/23   [name], MD  allopurinol  (ZYLOPRIM ) 100 MG tablet Take 1 tablet (100 mg total) by mouth daily. 11/13/23 11/07/24  [name], MD  aspirin  EC 81 MG tablet Take 1 tablet (81 mg total) by mouth daily. Swallow whole. 11/13/23 11/07/24  [name], MD  blood glucose meter kit and supplies KIT Dispense based on patient and insurance preference. Use up to four times daily as directed. 12/25/20   Lue Elsie BROCKS, MD  Blood Glucose Monitoring Suppl DEVI 1 each by Does not apply route in the morning, at noon, and at bedtime. May substitute to any manufacturer covered by patient's insurance. 11/13/23   [name], MD  budesonide  (PULMICORT ) 0.25 MG/2ML nebulizer solution Inhale 2 mLs (0.25 mg total) by nebulization 2 (two) times daily. 11/13/23   [name], MD  busPIRone  (BUSPAR ) 30 MG  tablet Take 1 tablet (30 mg total) by mouth 2 (two) times daily. 12/05/23 11/29/24  [name], MD  carvedilol  (COREG ) 6.25 MG tablet Take 1 tablet (6.25 mg total) by mouth 2 (two) times daily. 12/26/23   [name], MD  clotrimazole  (LOTRIMIN ) 1 % cream Apply topically 2 (two) times daily. 12/13/23   [name], MD  diclofenac  Sodium (VOLTAREN ) 1 % GEL Apply 4 g topically 4 (four) times daily. 12/15/23   Freddi Hamilton, MD  dicyclomine  (BENTYL ) 20 MG tablet Take 1 tablet (20 mg total) by mouth 2 (two) times daily. 12/26/23   [name], MD  EPINEPHrine  0.3 mg/0.3 mL IJ SOAJ injection Inject 0.3 mg into the muscle once as needed for anaphylaxis.    [provider]  fenofibrate  (TRICOR ) 145 MG tablet Take  1 tablet (145 mg total) by mouth daily. 12/26/23   [name], MD  FEROSUL 325 (65 Fe) MG tablet Take 1 tablet (325 mg total) by mouth daily. 12/26/23   [name], MD  gabapentin  (NEURONTIN ) 100 MG capsule Take 1 capsule 3 times a day by oral route as needed for 30 days. 12/06/23   [provider]  ipratropium-albuterol  (DUONEB) 0.5-2.5 (3) MG/3ML SOLN Take 3 mLs by nebulization every 4 (four) hours as needed. 01/23/24   Arrien, Mauricio Daniel, MD  levocetirizine (XYZAL ) 5 MG tablet TAKE 1 TABLET (5 MG TOTAL) BY MOUTH AT BEDTIME. 12/17/23   [name], MD  levothyroxine  (SYNTHROID ) 125 MCG tablet TAKE 1 TABLET (125 MCG TOTAL) BY MOUTH AT BEDTIME. 03/20/24   [name], MD  lidocaine  (HM LIDOCAINE  PATCH) 4 % Place 1 patch onto the skin daily. 03/13/24   Donnajean Lynwood DEL, PA-C  losartan  (COZAAR ) 25 MG tablet Take 1 tablet (25 mg total) by mouth daily. 11/13/23   [name], MD  lurasidone  (LATUDA ) 20 MG TABS tablet Take 1 tablet (20 mg total) by mouth at bedtime. Take with a snack. This is for mood and sleep. 12/25/23 12/19/24  [name], MD  metolazone  (ZAROXOLYN ) 2.5 MG tablet Take 1 tablet (2.5 mg total) by mouth as directed.  TAKE ONE DOSE OF METOLAZONE  2.5MG  TODAY, AND TOMORROW WITH (2) TABLETS OF POTASSIUM WITH THIS 03/19/24   Hayes Beckey CROME, NP  nitroGLYCERIN  (NITROSTAT ) 0.4 MG SL tablet Place 1 tablet (0.4 mg total) under the tongue every 5 (five) minutes as needed for chest pain. 12/25/23   [name], MD  omeprazole  (PRILOSEC) 20 MG capsule TAKE 2 CAPSULES (40 MG TOTAL) BY MOUTH DAILY (2AM) 03/20/24   [name], MD  ondansetron  (ZOFRAN -ODT) 4 MG disintegrating tablet Take 1 tablet (4 mg total) by mouth every 8 (eight) hours as needed for nausea or vomiting. 12/26/23   [name], MD  OXYGEN  Inhale 2 L into the lungs continuous.    [provider]  potassium chloride  (KLOR-CON  M) 10 MEQ tablet Take 2 tablets (20 mEq total) by mouth daily. 03/03/24   Rolan Ezra RAMAN, MD  Rimegepant Sulfate (NURTEC) 75 MG TBDP Take 1 tablet (75 mg total) by mouth every other day. Prevents and Treats Migraine Headaches 12/25/23 12/19/24  [name], MD  rizatriptan  (MAXALT ) 10 MG tablet Take 1 tablet (10 mg total) by mouth as needed for migraine. May repeat in 2 hours if needed 01/23/24   Arrien, Elidia Sieving, MD  rosuvastatin  (CRESTOR ) 10 MG tablet Take 1 tablet (10 mg total) by mouth every evening. 11/13/23   [name], MD  spironolactone  (ALDACTONE ) 25 MG tablet Take 0.5 tablets (12.5 mg total) by mouth daily. 01/23/24   Arrien, Mauricio Daniel, MD  torsemide  (DEMADEX ) 20 MG tablet Take 4 tablets (80 mg total) by mouth 2 (two) times daily. 03/19/24   Hayes Beckey CROME, NP  umeclidinium-vilanterol (ANORO ELLIPTA ) 62.5-25 MCG/ACT AEPB Inhale 1 puff into the lungs daily. 01/02/24   [name], MD  Vitamin D , Ergocalciferol , (DRISDOL ) 1.25 MG (50000 UNIT) CAPS capsule Take 1 capsule (50,000 Units total) by mouth every 7 (seven) days. 11/13/23   [name], MD    Physical Exam: BP (!) 108/59   Pulse 78   Temp 98.5 F (36.9 C) (Oral)   Resp 15   Ht 5' 5 (1.651 m)   Wt (!) 143.8  kg   SpO2 93%   BMI 52.75 kg/m  General: Pleasant, well-appearing *** laying in bed. No acute distress. HEENT: [place]/AT. Anicteric sclera CV: RRR. No murmurs, rubs, or gallops. No LE edema Pulmonary: Lungs CTAB. Normal effort. No wheezing or rales. Abdominal: Soft, nontender, nondistended. Normal bowel sounds. Extremities: Palpable radial and DP pulses. Normal ROM. Skin: Warm and dry. No obvious rash or lesions. Neuro: A&Ox3. Moves all extremities. Normal sensation to light touch. No focal deficit. Psych: Normal mood and affect          Labs on Admission:  Basic Metabolic Panel: Recent Labs  Lab 03/19/24 1318 03/23/24 1344 03/23/24 1528  NA 136 135 137  K 3.7 3.6 3.4*  CL 83* 81* 81*  CO2 43* 41*  --   GLUCOSE 185* 175* 173*  BUN 35* 36* 42*  CREATININE 1.36* 1.30* 1.40*  CALCIUM  8.9 8.4*  --    Liver Function Tests: Recent Labs  Lab 03/23/24 1344  AST 33  ALT 22  ALKPHOS 50  BILITOT 0.6  PROT 7.4  ALBUMIN  4.1   No results for input(s): LIPASE, AMYLASE in the last 168 hours. No results for input(s): AMMONIA  in the last 168 hours. CBC: Recent Labs  Lab 03/23/24 1522 03/23/24 1528  WBC 15.1*  --   NEUTROABS 11.5*  --   HGB 12.2 15.0  HCT 39.4 44.0  MCV 98.5  --   PLT 300  --    Cardiac Enzymes: No results for input(s): CKTOTAL, CKMB, CKMBINDEX, TROPONINI in the last 168 hours. BNP (last 3 results) Recent Labs    03/12/24 2311 03/19/24 1318 03/23/24 1344  BNP 12.1 8.7 6.9    ProBNP (last 3 results) Recent Labs    01/16/24 2043  PROBNP 183.0    CBG: No results for input(s): GLUCAP in the last 168 hours.  Radiological Exams on Admission: CT Angio Chest PE W and/or Wo Contrast Result Date: 03/23/2024 EXAM: CTA of the Chest with contrast for PE 03/23/2024 05:16:34 PM TECHNIQUE: CTA of the chest was performed without and with the administration of 100 mL of iohexol  (OMNIPAQUE ) 350 MG/ML injection. Multiplanar reformatted images are  provided for review. MIP images are provided for review. Automated exposure control, iterative reconstruction, and/or weight based adjustment of the mA/kV was utilized to reduce the radiation dose to as low as reasonably achievable. COMPARISON: CTA chest dated 01/16/2024 and CT chest dated 12/13/2022. CLINICAL HISTORY: Pulmonary embolism (PE) suspected, high prob. FINDINGS: PULMONARY ARTERIES: Pulmonary arteries are adequately opacified for evaluation. No pulmonary embolism. Enlargement of the main pulmonary artery, suggesting pulmonary arterial hypertension. MEDIASTINUM: Stable moderate cardiomegaly. Thoracic aortic atherosclerosis. The pericardium demonstrates no acute abnormality. LYMPH NODES: No mediastinal, hilar or axillary lymphadenopathy. LUNGS AND PLEURA: 10 mm right middle lobe nodule (image 49), similar versus minimally progressive from recent CT, previously 4 mm in 2024. Mild linear and patchy opacities in the inferior right middle lobe, lingula, and bilateral lower lobes, favoring atelectasis/scarring. Additional subpleural patchy opacities inferiorly in the right upper lobe (image 42), suggesting mild infection/pneumonia, or possibly postinfectious/inflammatory scarring. No pleural effusion or pneumothorax. UPPER ABDOMEN: Limited images of the upper abdomen are unremarkable. SOFT TISSUES AND BONES: Degenerative changes of the visualized thoracolumbar spine. No acute soft tissue abnormality. IMPRESSION: 1. No pulmonary embolism. 2. 10 mm right middle lobe nodule, progressive from 2024, suspicious. Consider PET-CT or 3 month follow-up CT chest for further evaluation. 3. Subpleural patchy opacities inferiorly in the right upper lobe, which may reflect mild infection  or postinfectious/inflammatory scarring. 4. Additional ancillary findings, as above. Electronically signed by: Pinkie Pebbles MD 03/23/2024 06:04 PM EST RP Workstation: [id number]   CT ABDOMEN PELVIS W CONTRAST Result Date:  03/23/2024 EXAM: CT ABDOMEN AND PELVIS WITH CONTRAST 03/23/2024 05:16:34 PM TECHNIQUE: CT of the abdomen and pelvis was performed with the administration of intravenous contrast. Multiplanar reformatted images are provided for review. Automated exposure control, iterative reconstruction, and/or weight-based adjustment of the mA/kV was utilized to reduce the radiation dose to as low as reasonably achievable. COMPARISON: CT abdomen and pelvis 01/16/2024. CLINICAL HISTORY: Abdominal/flank pain, stone suspected. FINDINGS: LOWER CHEST: Atelectasis in the lung bases. There is a 9 mm right middle lobe nodule which is unchanged from prior. LIVER: The liver is enlarged with diffuse fatty infiltration. GALLBLADDER AND BILE DUCTS: Gallbladder is unremarkable. No biliary ductal dilatation. SPLEEN: No acute abnormality. PANCREAS: There is a 9 mm focal hypodensity within the neck of the pancreas image 3/30 which appears unchanged. ADRENAL GLANDS: No acute abnormality. KIDNEYS, URETERS AND BLADDER: No stones in the kidneys or ureters. No hydronephrosis. No perinephric or periureteral stranding. Urinary bladder is unremarkable. GI AND BOWEL: Stomach demonstrates no acute abnormality. The appendix appears normal. There is no bowel obstruction. PERITONEUM AND RETROPERITONEUM: No ascites. No free air. VASCULATURE: Aorta is normal in caliber. LYMPH NODES: No lymphadenopathy. REPRODUCTIVE ORGANS: The uterus is surgically absent. BONES AND SOFT TISSUES: No acute osseous abnormality. No focal soft tissue abnormality. IMPRESSION: 1. No acute findings in the abdomen or pelvis. 2. Unchanged 9 mm right middle lobe pulmonary nodule; no routine follow-up recommended per Fleischner Society Guidelines. 3. 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 Electronically signed by: Greig Pique MD 03/23/2024 05:53 PM EST RP Workstation: [id number]    DG Chest Portable 1 View Result Date: 03/23/2024 CLINICAL DATA:  dyspnea, cough EXAM: PORTABLE CHEST 1 VIEW COMPARISON:  March 12, 2024 fall January 20, 2024 FINDINGS: Evaluation is limited by underpenetration and positioning. The cardiomediastinal silhouette is unchanged enlarged in contour.Low lung volumes. No pleural effusion. No pneumothorax. No acute pleuroparenchymal abnormality. Neck surgical clips. IMPRESSION: No acute cardiopulmonary abnormality. Electronically Signed   By: Corean Salter M.D.   On: 03/23/2024 14:27   Assessment/Plan Michele Owens is a 63 y.o. female with medical history significant for morbid obesity, HTN, OSA, COPD, chronic hypoxic respiratory failure on 2 L [place], diastolic HF, gout, bipolar, SVT, migraines, GERD, history of DVT, anxiety and depression who presented to the ED for evaluation of chest pain and generalized weakness and admitted for community-acquired pneumonia.  # Community-acquired pneumonia - Pt presented with *** - Chest imaging shows *** - Continue IV Rocephin  and azithromycin  - Mucinex  DM twice daily - Follow-up blood culture and sputum culture - Check MRSA screen, full RVP, procalcitonin, urinary Legionella and strep pneumo - Trend CBC, fever curve - Incentive spirometer, flutter valve - Supplemental O2 as needed  # AKI - Creatinine slightly elevated to 1.30 from recent baseline of 0.8-0.9 - Likely secondary to slight overdiuresis - Hold losartan , torsemide  and spironolactone  today in the setting of AKI - Resume meds when appropriate  # HTN - BP soft with SBP in the 90-110s - Holding BP meds as above  # T2DM, well-controlled - # HFpEF - Patient slightly dry on exam with soft BP -Torsemide  recently increased from 60 mg to 80 mg twice daily - Hold torsemide  for now  and resume when appropriate  # Hx of SVT - EKG shows normal sinus rhythm, HR stable in the 70s to 80s - Resume Coreg  tomorrow  # COPD # Chronic hypoxic  respiratory failure - Respiratory status remained stable, remains on home 2 L with no signs of acute COPD exacerbation -Continue Anoro Ellipta , DuoNebs - Continue supplemental O2  # Prolonged Qtc - EKG on admission shows prolonged QTc of 540 - Avoid QTc prolonging meds - Trend and repeat  electrolytes - Telemetry  # Hypothyroidism - Continue Synthroid   # GERD - Continue PPI  # Allergic rhinitis - Continue Xyzal   # Bipolar disorder # Anxiety and depression -Continue BuSpar  and Latuda   # Hx of migraine pains - Continue as needed Imitrex   # Class III obesity Body mass index is 52.75 kg/m. Filed Weights   03/23/24 1311  Weight: (!) 143.8 kg  - F/u with PCP for weight lost and nutrition counseling  DVT prophylaxis: Lovenox      Code Status: Full Code  Consults called: None  Family Communication: No family at bedside  Severity of Illness: The appropriate patient status for this patient is INPATIENT. Inpatient status is judged to be reasonable and necessary in order to provide the required intensity of service to ensure the patient's safety. The patient's presenting symptoms, physical exam findings, and initial radiographic and laboratory data in the context of their chronic comorbidities is felt to place them at high risk for further clinical deterioration. Furthermore, it is not anticipated that the patient will be medically stable for discharge from the hospital within 2 midnights of admission.   * I certify that at the point of admission it is my clinical judgment that the patient will require inpatient hospital care spanning beyond 2 midnights from the point of admission due to high intensity of service, high risk for further deterioration and high frequency of surveillance required.*  Level of care: Telemetry    Lou Claretta HERO, MD 03/24/2024, 12:01 AM [facility] Pager: [phone] Isaiah 41:10   If 7PM-7AM, please contact  night-coverage [email or web address] Password [email or web address]       [1] Allergies Allergen Reactions   Bee Venom Anaphylaxis    All over my body (swelling)   Fioricet  [Butalbital -Apap-Caffeine ] Nausea And Vomiting and Rash   Ibuprofen  Rash and Other (See Comments)    Severe rash   Lamisil  [Terbinafine ] Rash and Other (See Comments)    Pt states this causes her to feel funny   Nsaids Other (See Comments)    Per MD's orders

## 2024-03-24 ENCOUNTER — Ambulatory Visit (HOSPITAL_COMMUNITY): Payer: Self-pay | Admitting: Internal Medicine

## 2024-03-24 DIAGNOSIS — R531 Weakness: Secondary | ICD-10-CM

## 2024-03-24 LAB — RESPIRATORY PANEL BY PCR

## 2024-03-24 LAB — CBC
HCT: 40.1 % (ref 36.0–46.0)
Hemoglobin: 12.1 g/dL (ref 12.0–15.0)
MCH: 30.4 pg (ref 26.0–34.0)
MCHC: 30.2 g/dL (ref 30.0–36.0)
MCV: 100.8 fL — ABNORMAL HIGH (ref 80.0–100.0)
Platelets: 266 K/uL (ref 150–400)
RBC: 3.98 MIL/uL (ref 3.87–5.11)
RDW: 13.6 % (ref 11.5–15.5)
WBC: 11.9 K/uL — ABNORMAL HIGH (ref 4.0–10.5)
nRBC: 0 % (ref 0.0–0.2)

## 2024-03-24 LAB — BASIC METABOLIC PANEL WITH GFR
Anion gap: 10 (ref 5–15)
BUN: 34 mg/dL — ABNORMAL HIGH (ref 8–23)
CO2: 43 mmol/L — ABNORMAL HIGH (ref 22–32)
Calcium: 8.4 mg/dL — ABNORMAL LOW (ref 8.9–10.3)
Chloride: 86 mmol/L — ABNORMAL LOW (ref 98–111)
Creatinine, Ser: 1.22 mg/dL — ABNORMAL HIGH (ref 0.44–1.00)
GFR, Estimated: 50 mL/min — ABNORMAL LOW (ref >60)
Glucose, Bld: 129 mg/dL — ABNORMAL HIGH (ref 70–99)
Potassium: 3.5 mmol/L (ref 3.5–5.1)
Sodium: 139 mmol/L (ref 135–145)

## 2024-03-24 LAB — PROCALCITONIN: Procalcitonin: <0.1 ng/mL

## 2024-03-24 LAB — MAGNESIUM: Magnesium: 1.8 mg/dL (ref 1.7–2.4)

## 2024-03-24 LAB — MRSA NEXT GEN BY PCR, NASAL: MRSA by PCR Next Gen: NOT DETECTED

## 2024-03-24 LAB — STREP PNEUMONIAE URINARY ANTIGEN: Strep Pneumo Urinary Antigen: NEGATIVE

## 2024-03-24 MED ORDER — DOXYCYCLINE HYCLATE 100 MG PO TABS
100.0000 mg | ORAL_TABLET | Freq: Two times a day (BID) | ORAL | Status: DC
  Administered 2024-03-24 – 2024-03-25 (×3): 100 mg via ORAL
  Filled 2024-03-24 (×3): qty 1

## 2024-03-24 MED ORDER — CARVEDILOL 6.25 MG PO TABS
6.2500 mg | ORAL_TABLET | Freq: Two times a day (BID) | ORAL | Status: DC
  Administered 2024-03-24 – 2024-03-25 (×3): 6.25 mg via ORAL
  Filled 2024-03-24: qty 2
  Filled 2024-03-24 (×2): qty 1

## 2024-03-24 MED ORDER — GABAPENTIN 100 MG PO CAPS
100.0000 mg | ORAL_CAPSULE | Freq: Two times a day (BID) | ORAL | Status: DC
  Administered 2024-03-24 – 2024-03-25 (×2): 100 mg via ORAL
  Filled 2024-03-24 (×3): qty 1

## 2024-03-24 MED ORDER — POTASSIUM CHLORIDE CRYS ER 20 MEQ PO TBCR
20.0000 meq | EXTENDED_RELEASE_TABLET | Freq: Every day | ORAL | Status: DC
  Administered 2024-03-24 – 2024-03-25 (×2): 20 meq via ORAL
  Filled 2024-03-24 (×2): qty 1

## 2024-03-24 MED ORDER — BUSPIRONE HCL 5 MG PO TABS
30.0000 mg | ORAL_TABLET | Freq: Two times a day (BID) | ORAL | Status: DC
  Administered 2024-03-24 – 2024-03-25 (×4): 30 mg via ORAL
  Filled 2024-03-24: qty 3
  Filled 2024-03-24: qty 6
  Filled 2024-03-24: qty 3
  Filled 2024-03-24: qty 6

## 2024-03-24 MED ORDER — FERROUS SULFATE 325 (65 FE) MG PO TABS
325.0000 mg | ORAL_TABLET | Freq: Every day | ORAL | Status: DC
  Administered 2024-03-24 – 2024-03-25 (×2): 325 mg via ORAL
  Filled 2024-03-24 (×2): qty 1

## 2024-03-24 MED ORDER — DM-GUAIFENESIN ER 30-600 MG PO TB12
1.0000 | ORAL_TABLET | Freq: Two times a day (BID) | ORAL | Status: DC
  Administered 2024-03-24 – 2024-03-25 (×4): 1 via ORAL
  Filled 2024-03-24 (×6): qty 1

## 2024-03-24 MED ORDER — IPRATROPIUM-ALBUTEROL 0.5-2.5 (3) MG/3ML IN SOLN: 3.0000 mL | RESPIRATORY_TRACT | Status: DC | PRN

## 2024-03-24 MED ORDER — LURASIDONE HCL 20 MG PO TABS
20.0000 mg | ORAL_TABLET | Freq: Every day | ORAL | Status: DC
  Administered 2024-03-24 (×2): 20 mg via ORAL
  Filled 2024-03-24 (×4): qty 1

## 2024-03-24 MED ORDER — ROSUVASTATIN CALCIUM 5 MG PO TABS
10.0000 mg | ORAL_TABLET | Freq: Every evening | ORAL | Status: DC
  Administered 2024-03-24: 18:00:00 10 mg via ORAL
  Filled 2024-03-24: qty 2

## 2024-03-24 MED ORDER — UMECLIDINIUM-VILANTEROL 62.5-25 MCG/ACT IN AEPB
1.0000 | INHALATION_SPRAY | Freq: Every day | RESPIRATORY_TRACT | Status: DC
  Filled 2024-03-24: qty 14

## 2024-03-24 MED ORDER — BUDESONIDE 0.25 MG/2ML IN SUSP
0.2500 mg | Freq: Two times a day (BID) | RESPIRATORY_TRACT | Status: DC
  Administered 2024-03-24 – 2024-03-25 (×2): 0.25 mg via RESPIRATORY_TRACT
  Filled 2024-03-24 (×2): qty 2

## 2024-03-24 MED ORDER — HYDROCODONE-ACETAMINOPHEN 5-325 MG PO TABS
1.0000 | ORAL_TABLET | ORAL | Status: DC | PRN
  Administered 2024-03-24 – 2024-03-25 (×4): 1 via ORAL
  Filled 2024-03-24 (×4): qty 1

## 2024-03-24 MED ORDER — DICLOFENAC SODIUM 1 % EX GEL
4.0000 g | Freq: Four times a day (QID) | CUTANEOUS | Status: DC
  Administered 2024-03-25: 11:00:00 4 g via TOPICAL
  Filled 2024-03-24 (×2): qty 100

## 2024-03-24 MED ORDER — PANTOPRAZOLE SODIUM 40 MG PO TBEC
40.0000 mg | DELAYED_RELEASE_TABLET | Freq: Every day | ORAL | Status: DC
  Administered 2024-03-24 – 2024-03-25 (×2): 40 mg via ORAL
  Filled 2024-03-24 (×2): qty 1

## 2024-03-24 MED ORDER — ASPIRIN 81 MG PO TBEC
81.0000 mg | DELAYED_RELEASE_TABLET | Freq: Every day | ORAL | Status: DC
  Administered 2024-03-24 – 2024-03-25 (×2): 81 mg via ORAL
  Filled 2024-03-24 (×2): qty 1

## 2024-03-24 MED ORDER — DICYCLOMINE HCL 20 MG PO TABS
20.0000 mg | ORAL_TABLET | Freq: Two times a day (BID) | ORAL | Status: DC
  Administered 2024-03-24 – 2024-03-25 (×4): 20 mg via ORAL
  Filled 2024-03-24 (×4): qty 1

## 2024-03-24 MED ORDER — SODIUM CHLORIDE 0.9 % IV SOLN
1.0000 g | INTRAVENOUS | Status: DC
  Administered 2024-03-24: 18:00:00 1 g via INTRAVENOUS
  Filled 2024-03-24: qty 10

## 2024-03-24 MED ORDER — CETIRIZINE HCL 10 MG PO TABS
10.0000 mg | ORAL_TABLET | Freq: Every day | ORAL | Status: DC
  Administered 2024-03-24 (×2): 10 mg via ORAL
  Filled 2024-03-24 (×4): qty 1

## 2024-03-24 MED ORDER — LEVOTHYROXINE SODIUM 25 MCG PO TABS
125.0000 ug | ORAL_TABLET | Freq: Every day | ORAL | Status: DC
  Administered 2024-03-24 – 2024-03-25 (×2): 125 ug via ORAL
  Filled 2024-03-24 (×2): qty 1

## 2024-03-24 MED ORDER — ALLOPURINOL 100 MG PO TABS
100.0000 mg | ORAL_TABLET | Freq: Every day | ORAL | Status: DC
  Administered 2024-03-24 – 2024-03-25 (×2): 100 mg via ORAL
  Filled 2024-03-24 (×2): qty 1

## 2024-03-24 NOTE — Evaluation (Signed)
 Occupational Therapy Evaluation Patient Details Name: Michele Owens MRN: [id number] DOB: 1960-10-22 Today's Date: 03/24/2024   History of Present Illness   63 yo F adm 12/14 CP and generalized weakness. Pt with workup for CAP and  AKI PMH obesity, HTN, OSA, COPD, hypoxic RF on 2L [place], CHF, gout, bipolar, SVT, migraines GERD, hx of DVT, anxiety depression   HFpEF, asthma,tobacco use, HLD, pulmonary edema, thyroid  CA, anasarca     Clinical Impressions Patient admitted for the diagnosis above.  PTA she lives at home alone with PCA assist for LB ADL and iADL.  On home O2 and uses a RW for mobility.  Patient stating recent falls at home and fear of falling due to knee buckle.  OT can follow in the acute setting to address deficits, and no post acute OT is anticipated given PCA assist.       If plan is discharge home, recommend the following:   Assist for transportation;Assistance with cooking/housework;A little help with bathing/dressing/bathroom     Functional Status Assessment   Patient has had a recent decline in their functional status and demonstrates the ability to make significant improvements in function in a reasonable and predictable amount of time.     Equipment Recommendations   None recommended by OT     Recommendations for Other Services         Precautions/Restrictions   Precautions Precautions: Fall Recall of Precautions/Restrictions: Intact Restrictions Weight Bearing Restrictions Per Provider Order: No     Mobility Bed Mobility Overal bed mobility: Needs Assistance Bed Mobility: Supine to Sit, Sit to Supine     Supine to sit: Supervision Sit to supine: Min assist     Patient Response: Cooperative  Transfers Overall transfer level: Needs assistance Equipment used: Rolling walker (2 wheels) Transfers: Sit to/from Stand, Bed to chair/wheelchair/BSC Sit to Stand: Supervision Stand pivot transfers: Contact guard assist   Step pivot  transfers: Contact guard assist            Balance Overall balance assessment: Needs assistance Sitting-balance support: Feet supported Sitting balance-Leahy Scale: Good     Standing balance support: Reliant on assistive device for balance Standing balance-Leahy Scale: Poor Standing balance comment: recent knee buckle with fall                           ADL either performed or assessed with clinical judgement   ADL       Grooming: Wash/dry hands;Wash/dry face;Set up;Sitting   Upper Body Bathing: Minimal assistance;Sitting       Upper Body Dressing : Minimal assistance;Sitting       Toilet Transfer: Contact guard assist;Stand-pivot;BSC/3in1                   Vision Baseline Vision/History: 1 Wears glasses Patient Visual Report: No change from baseline       Perception Perception: Not tested       Praxis Praxis: Not tested       Pertinent Vitals/Pain Pain Assessment Pain Assessment: Faces Faces Pain Scale: Hurts little more Pain Location: Back - chronic Pain Descriptors / Indicators: Aching Pain Intervention(s): Monitored during session     Extremity/Trunk Assessment Upper Extremity Assessment Upper Extremity Assessment: Overall WFL for tasks assessed   Lower Extremity Assessment Lower Extremity Assessment: Defer to PT evaluation   Cervical / Trunk Assessment Cervical / Trunk Assessment: Other exceptions Cervical / Trunk Exceptions: Body habitus   Communication Communication Communication: No apparent  difficulties   Cognition Arousal: Alert Behavior During Therapy: Anxious Cognition: No apparent impairments                               Following commands: Intact       Cueing  General Comments   Cueing Techniques: Verbal cues   VSS on O2   Exercises     Shoulder Instructions      Home Living Family/patient expects to be discharged to:: Private residence Living Arrangements: Alone Available Help  at Discharge: Personal care attendant Type of Home: Apartment Home Access: Level entry     Home Layout: One level     Bathroom Shower/Tub: Tub/shower unit;Sponge bathes at baseline   [employer]: Standard Bathroom Accessibility: Yes How Accessible: Accessible via walker Home Equipment: Rolling Walker (2 wheels);Rollator (4 wheels)   Additional Comments: has an aide M-F for 2 hours; wears 2L O2 baseline, reports turning it up to 5L when SOB but does not regularly check SPO2 at these times      Prior Functioning/Environment Prior Level of Function : Needs assist             Mobility Comments: mod I with transfers ADLs Comments: aide assists with LB ADL, assist for bathing, difficulty with shoes    OT Problem List: Pain;Impaired balance (sitting and/or standing);Decreased activity tolerance   OT Treatment/Interventions: Self-care/ADL training;Therapeutic activities;Patient/family education;Balance training;DME and/or AE instruction      OT Goals(Current goals can be found in the care plan section)   Acute Rehab OT Goals Patient Stated Goal: Return home OT Goal Formulation: With patient Time For Goal Achievement: 04/07/24 Potential to Achieve Goals: Good   OT Frequency:  Min 2X/week    Co-evaluation              AM-PAC OT 6 Clicks Daily Activity     Outcome Measure Help from another person eating meals?: None Help from another person taking care of personal grooming?: None Help from another person toileting, which includes using toliet, bedpan, or urinal?: A Little Help from another person bathing (including washing, rinsing, drying)?: A Lot Help from another person to put on and taking off regular upper body clothing?: A Little Help from another person to put on and taking off regular lower body clothing?: A Lot 6 Click Score: 18   End of Session Equipment Utilized During Treatment: Rolling walker (2 wheels);Oxygen  Nurse Communication: Mobility  status  Activity Tolerance: Patient tolerated treatment well Patient left: in bed;with call bell/phone within reach  OT Visit Diagnosis: Unsteadiness on feet (R26.81)                Time: 8956-8894 OT Time Calculation (min): 22 min Charges:  OT General Charges $OT Visit: 1 Visit OT Evaluation $OT Eval Moderate Complexity: 1 Mod  03/24/2024  RP, OTR/L  Acute Rehabilitation Services  Office:  [phone]   Michele Owens 03/24/2024, 11:28 AM

## 2024-03-24 NOTE — Evaluation (Signed)
 Physical Therapy Evaluation Patient Details Name: Michele Owens MRN: [id number] DOB: 02/13/1961 Today's Date: 03/24/2024  History of Present Illness  63 yo F adm 12/14 CP and generalized weakness. Pt with workup for CAP and  AKI PMH obesity, HTN, OSA, COPD, hypoxic RF on 2L [place], CHF, gout, bipolar, SVT, migraines GERD, hx of DVT, anxiety depression   HFpEF, asthma,tobacco use, HLD, pulmonary edema, thyroid  CA, anasarca  Clinical Impression  Patient presents with decreased mobility due to generalized weakness and decreased activity tolerance.  Reports walking at home with cane, though states R LE will give out and she will fall.  States has aide to help with most ADL's though interested in getting scooter for mobility to avoid falls.  Today able to ambulate in hallway with rollator and 3L O2 though dropped to 82% and needed seated rest to recover to 94% in 2 minutes.  She will benefit from skilled PT in the acute setting and from HHPT at d/c.         If plan is discharge home, recommend the following: A little help with bathing/dressing/bathroom;Help with stairs or ramp for entrance   Can travel by private vehicle        Equipment Recommendations None recommended by PT  Recommendations for Other Services       Functional Status Assessment Patient has had a recent decline in their functional status and demonstrates the ability to make significant improvements in function in a reasonable and predictable amount of time.     Precautions / Restrictions Precautions Precautions: Fall Recall of Precautions/Restrictions: Intact Precaution/Restrictions Comments: O2 dependent      Mobility  Bed Mobility   Bed Mobility: Supine to Sit     Supine to sit: Supervision, HOB elevated, Used rails Sit to supine: Min assist   General bed mobility comments: had assisted tech for pt to get into bed in ED prior to transfer to unit, no help to get up to sit in her new room with [facility] elevated     Transfers Overall transfer level: Needs assistance Equipment used: Rollator (4 wheels) Transfers: Sit to/from Stand Sit to Stand: Supervision           General transfer comment: up from EOB then from rollator, then from toilet in bathroom unaided    Ambulation/Gait Ambulation/Gait assistance: Contact guard assist Gait Distance (Feet): 80 Feet (x 2) Assistive device: Rollator (4 wheels) Gait Pattern/deviations: Step-to pattern, Step-through pattern, Decreased stride length       General Gait Details: prefers not to talk while walking, though did some talking, noted SpO2 drop to 82% while on 3L portable O2; up to 94% after seated rest x 2 minutes in rollator; then walked back to room  Stairs            Wheelchair Mobility     Tilt Bed    Modified Rankin (Stroke Patients Only)       Balance Overall balance assessment: Needs assistance Sitting-balance support: Feet supported Sitting balance-Leahy Scale: Good     Standing balance support: No upper extremity supported, During functional activity (washed hands at sink after toileting) Standing balance-Leahy Scale: Fair                               Pertinent Vitals/Pain Pain Assessment Faces Pain Scale: Hurts little more Pain Location: R leg Pain Descriptors / Indicators: Sore Pain Intervention(s): Monitored during session    Home Living Family/patient  expects to be discharged to:: Private residence Living Arrangements: Alone Available Help at Discharge: Personal care attendant Type of Home: Apartment Home Access: Level entry       Home Layout: One level Home Equipment: [employer] (2 wheels);Rollator (4 wheels);Cane - single point Additional Comments: has an aide M-F for 2 hours; wears 2L O2 baseline, reports turning it up to 5L when SOB but does not regularly check SPO2 at these times    Prior Function Prior Level of Function : Needs assist             Mobility Comments: mod  I with transfers ADLs Comments: aide assists with LB ADL, assist for bathing, difficulty with shoes     Extremity/Trunk Assessment   Upper Extremity Assessment Upper Extremity Assessment: Defer to OT evaluation    Lower Extremity Assessment Lower Extremity Assessment: Generalized weakness    Cervical / Trunk Assessment Cervical / Trunk Assessment: Other exceptions Cervical / Trunk Exceptions: Body habitus  Communication   Communication Communication: No apparent difficulties    Cognition Arousal: Alert Behavior During Therapy: Anxious                             Following commands: Intact       Cueing Cueing Techniques: Verbal cues     General Comments General comments (skin integrity, edema, etc.): toileted with a little help to reach her pants to pull up after toileting    Exercises     Assessment/Plan    PT Assessment Patient needs continued PT services  PT Problem List Decreased strength;Decreased balance;Decreased mobility;Cardiopulmonary status limiting activity       PT Treatment Interventions DME instruction;Gait training;Functional mobility training;Therapeutic activities;Therapeutic exercise;Balance training;Patient/family education    PT Goals (Current goals can be found in the Care Plan section)  Acute Rehab PT Goals Patient Stated Goal: not fall PT Goal Formulation: With patient Time For Goal Achievement: 04/07/24 Potential to Achieve Goals: Good    Frequency Min 3X/week     Co-evaluation               AM-PAC PT 6 Clicks Mobility  Outcome Measure Help needed turning from your back to your side while in a flat bed without using bedrails?: A Little Help needed moving from lying on your back to sitting on the side of a flat bed without using bedrails?: A Little Help needed moving to and from a bed to a chair (including a wheelchair)?: A Little Help needed standing up from a chair using your arms (e.g., wheelchair or  bedside chair)?: None Help needed to walk in hospital room?: A Little Help needed climbing 3-5 steps with a railing? : Total 6 Click Score: 17    End of Session Equipment Utilized During Treatment: Gait belt;Oxygen  Activity Tolerance: Patient limited by fatigue Patient left: in bed;with call bell/phone within reach (seated on EOB)   PT Visit Diagnosis: Other abnormalities of gait and mobility (R26.89);Muscle weakness (generalized) (M62.81);History of falling (Z91.81)    Time: 1720-1800 PT Time Calculation (min) (ACUTE ONLY): 40 min   Charges:   PT Evaluation $PT Eval Moderate Complexity: 1 Mod PT Treatments $Gait Training: 8-22 mins $Therapeutic Activity: 8-22 mins PT General Charges $$ ACUTE PT VISIT: 1 Visit         Micheline Portal, PT Acute Rehabilitation Services Office:[phone] 03/24/2024   Montie Portal 03/24/2024, 7:12 PM

## 2024-03-24 NOTE — Telephone Encounter (Signed)
 Patient went to ED for symptoms listed below

## 2024-03-24 NOTE — Progress Notes (Signed)
 PROGRESS NOTE    Michele Owens  FMW:[id number] DOB: [birth year] DOA: 03/23/2024 PCP: [name], MD   Brief Narrative:    Michele Owens is a 63 y.o. female with medical history significant for morbid obesity, HTN, OSA, COPD, chronic hypoxic respiratory failure on 2 L [place], diastolic HF, gout, bipolar, SVT, migraines, GERD, history of DVT, anxiety and depression who presented to the ED for evaluation of chest pain and generalized weakness.  Patient reports that she has been falling recently due to her legs giving out on her while ambulating.  She also complains of shortness of breath, chest discomfort and bloodstained sputum over the last few days.  She also endorsed some back pain and migraine headache but denies any fevers, chills, abdominal pain, nausea, vomiting, leg swelling or visual changes. Reports that she was not responding well to her torsemide  so the dose was increased by her heart failure team last week.   ED Course: Initial vitals show patient afebrile, RR 14-22, HR 70-80s, SBP 90-110s, SpO2 94% on 2 L. Initial labs significant for BUNs/creatinine 36/1.30, glucose 175, WBC 15.1, UA unremarkable. EKG shows sinus rhythm with prolonged QTc of 540. CXR shows no active disease.  CTA PE study shows pleural patchy opacities inferiorly in the right upper lobe.  CT A/P with no acute intra-abdominal pathology.  Pt received IV Rocephin , oral doxycycline , oxycodone , IV LR 500 cc bolus and IV NS 500 cc bolus.   Admitted by [facility] for pneumonia  Assessment & Plan:   Principal Problem:   Community acquired pneumonia Active Problems:   AKI (acute kidney injury)   Morbid obesity with BMI of 50.0-59.9, adult (HCC)   Generalized weakness   # Community-acquired pneumonia - Pt presented with 2 days of chest discomfort, productive cough and generalized weakness - Chest imaging shows signs of patchy opacities inferiorly in the right upper lobe - Continue IV Rocephin  and doxycycline  -  Mucinex  DM twice daily - Follow-up blood culture and sputum culture -MRSA screen is negative, respiratory viral panel is negative, streptococcal pneumoniae antigen negative, procalcitonin interestingly is less than 0.10, white count has improved to 11. - Trend CBC, fever curve - Incentive spirometer, flutter valve - Supplemental O2 as needed   # AKI - Creatinine slightly elevated to 1.30 from recent baseline of 0.8-0.9 - Likely secondary to slight overdiuresis - Start IV NS 75 cc/h for 10 hours - continue  losartan , torsemide  and spironolactone  today in the setting of AKI - Resume meds when appropriate   # HTN - BP still soft with SBP in the 90-110s - Holding BP meds as above   # T2DM, well-controlled - Last A1c 6.9% 2 months ago - Carb modified diet   # HFpEF - Patient slightly dry on exam with soft BP - Torsemide  recently increased from 60 mg to 80 mg twice daily - Hold torsemide  for now and resume when appropriate   # COPD # Chronic hypoxic respiratory failure - Respiratory status remained stable, remains on home 2 L with no signs of acute COPD exacerbation - Continue Anoro Ellipta , and PRN DuoNebs - Continue supplemental O2   # Hx of SVT - EKG shows normal sinus rhythm, HR stable in the 70s to 80s - Holding Coreg  due to hypotension.  # HLD -Continue rosuvastatin    # Prolonged Qtc - EKG on admission shows prolonged QTc of 540 - Avoid QTc prolonging meds - Trend and repeat  electrolytes - Telemetry   # Hypothyroidism - Continue Synthroid    #  GERD - Continue PPI   # Allergic rhinitis - Continue Xyzal    # Bipolar disorder # Anxiety and depression - Continue BuSpar  and Latuda    # Hx of migraine pains - Continue as needed Imitrex    # OSA - Continue supplemental O2 at bedtime   # Generalized weakness - In the setting of acute illness - PT/OT eval and treat   # Hx of DVT - Not on anticoagulation   # Class III obesity Body mass index is 52.75  kg/m.    Filed Weights    03/23/24 1311  Weight: (!) 143.8 kg  - F/u with PCP for weight lost and nutrition counseling   DVT prophylaxis: Lovenox       Code Status: Full Code   Consults called: None   Family Communication: No family at bedside   Severity of Illness: The appropriate patient status for this patient is INPATIENT. Inpatient status is judged to be reasonable and necessary in order to provide the required intensity of service to ensure the patient's safety. The patient's presenting symptoms, physical exam findings, and initial radiographic and laboratory data in the context of their chronic comorbidities is felt to place them at high risk for further clinical deterioration. Furthermore, it is not anticipated that the patient will be medically stable for discharge from the hospital within 2 midnights of admission.    * I certify that at the point of admission it is my clinical judgment that the patient will require inpatient hospital care spanning beyond 2 midnights from the point of admission due to high intensity of service, high risk for further deterioration and high frequency of surveillance required.*   Level of care: Telemetry    I personally spent a total of 75 minutes in the care of the patient today including preparing to see the patient, getting/reviewing separately obtained history, performing a medically appropriate exam/evaluation, placing orders, and documenting clinical information in the EHR.     Subjective:  Patient seen and examined at the bedside.  She reports of ongoing generalized weakness.  Has some cough.  Denies fever or chills.  Denies chest pain today.  She does report of chronic low back pain.   Objective: Vitals:   03/24/24 0000 03/24/24 0100 03/24/24 0110 03/24/24 0615  BP: (!) 104/59 (!) 113/52  109/73  Pulse: 78 82  85  Resp: 16 15  13   Temp:   98.6 F (37 C)   TempSrc:   Oral   SpO2: 94%   93%  Weight:      Height:         Intake/Output Summary (Last 24 hours) at 03/24/2024 9177 Last data filed at 03/23/2024 1530 Gross per 24 hour  Intake 501.77 ml  Output --  Net 501.77 ml   Filed Weights   03/23/24 1311  Weight: (!) 143.8 kg    Examination:  General exam: Appears calm and comfortable  Respiratory system: Bilateral decreased breath sounds at bases Cardiovascular system: S1 & S2 heard, Rate controlled Gastrointestinal system: Abdomen is nondistended, soft and nontender. Normal bowel sounds heard. Extremities: No cyanosis, clubbing, edema  Central nervous system: Alert and oriented. No focal neurological deficits. Moving extremities Skin: No rashes, lesions or ulcers Psychiatry: Judgement and insight appear normal. Mood & affect appropriate.     Data Reviewed: I have personally reviewed following labs and imaging studies  CBC: Recent Labs  Lab 03/23/24 1522 03/23/24 1528 03/24/24 0602  WBC 15.1*  --  11.9*  NEUTROABS 11.5*  --   --  HGB 12.2 15.0 12.1  HCT 39.4 44.0 40.1  MCV 98.5  --  100.8*  PLT 300  --  266   Basic Metabolic Panel: Recent Labs  Lab 03/19/24 1318 03/23/24 1344 03/23/24 1528 03/24/24 0602  NA 136 135 137 139  K 3.7 3.6 3.4* 3.5  CL 83* 81* 81* 86*  CO2 43* 41*  --  43*  GLUCOSE 185* 175* 173* 129*  BUN 35* 36* 42* 34*  CREATININE 1.36* 1.30* 1.40* 1.22*  CALCIUM  8.9 8.4*  --  8.4*  MG  --   --   --  1.8   GFR: Estimated Creatinine Clearance: 68.3 mL/min (A) (by C-G formula based on SCr of 1.22 mg/dL (H)). Liver Function Tests: Recent Labs  Lab 03/23/24 1344  AST 33  ALT 22  ALKPHOS 50  BILITOT 0.6  PROT 7.4  ALBUMIN  4.1   No results for input(s): LIPASE, AMYLASE in the last 168 hours. No results for input(s): AMMONIA  in the last 168 hours. Coagulation Profile: No results for input(s): INR, PROTIME in the last 168 hours. Cardiac Enzymes: No results for input(s): CKTOTAL, CKMB, CKMBINDEX, TROPONINI in the last 168  hours. BNP (last 3 results) Recent Labs    01/16/24 2043  PROBNP 183.0   HbA1C: No results for input(s): HGBA1C in the last 72 hours. CBG: No results for input(s): GLUCAP in the last 168 hours. Lipid Profile: No results for input(s): CHOL, HDL, LDLCALC, TRIG, CHOLHDL, LDLDIRECT in the last 72 hours. Thyroid  Function Tests: No results for input(s): TSH, T4TOTAL, FREET4, T3FREE, THYROIDAB in the last 72 hours. Anemia Panel: No results for input(s): VITAMINB12, FOLATE, FERRITIN, TIBC, IRON, RETICCTPCT in the last 72 hours. Sepsis Labs: Recent Labs  Lab 03/24/24 0602  PROCALCITON <0.10    Recent Results (from the past 240 hours)  Resp panel by RT-PCR (RSV, Flu A&B, Covid) Anterior Nasal Swab     Status: None   Collection Time: 03/23/24  1:44 PM   Specimen: Anterior Nasal Swab  Result Value Ref Range Status   SARS Coronavirus 2 by RT PCR NEGATIVE NEGATIVE Final   Influenza A by PCR NEGATIVE NEGATIVE Final   Influenza B by PCR NEGATIVE NEGATIVE Final    Comment: (NOTE) The Xpert Xpress SARS-CoV-2/FLU/RSV plus assay is intended as an aid in the diagnosis of influenza from Nasopharyngeal swab specimens and should not be used as a sole basis for treatment. Nasal washings and aspirates are unacceptable for Xpert Xpress SARS-CoV-2/FLU/RSV testing.  Fact Sheet for Patients: bloggercourse.com  Fact Sheet for Healthcare Providers: seriousbroker.it  This test is not yet approved or cleared by the [place]  FDA and has been authorized for detection and/or diagnosis of SARS-CoV-2 by FDA under an Emergency Use Authorization (EUA). This EUA will remain in effect (meaning this test can be used) for the duration of the COVID-19 declaration under Section 564(b)(1) of the Act, 21 U.S.C. section 360bbb-3(b)(1), unless the authorization is terminated or revoked.     Resp Syncytial Virus by PCR  NEGATIVE NEGATIVE Final    Comment: (NOTE) Fact Sheet for Patients: bloggercourse.com  Fact Sheet for Healthcare Providers: seriousbroker.it  This test is not yet approved or cleared by the [place]  FDA and has been authorized for detection and/or diagnosis of SARS-CoV-2 by FDA under an Emergency Use Authorization (EUA). This EUA will remain in effect (meaning this test can be used) for the duration of the COVID-19 declaration under Section 564(b)(1) of the Act, 21 U.S.C. section 360bbb-3(b)(1), unless the  authorization is terminated or revoked.  Performed at [facility], [address], [place], [place] [address]   Respiratory (~20 pathogens) panel by PCR     Status: None   Collection Time: 03/24/24 12:06 AM   Specimen: Respiratory  Result Value Ref Range Status   Adenovirus NOT DETECTED NOT DETECTED Final   Coronavirus 229E NOT DETECTED NOT DETECTED Final    Comment: (NOTE) The Coronavirus on the Respiratory Panel, DOES NOT test for the novel  Coronavirus (2019 nCoV)    Coronavirus HKU1 NOT DETECTED NOT DETECTED Final   Coronavirus NL63 NOT DETECTED NOT DETECTED Final   Coronavirus OC43 NOT DETECTED NOT DETECTED Final   Metapneumovirus NOT DETECTED NOT DETECTED Final   Rhinovirus / Enterovirus NOT DETECTED NOT DETECTED Final   Influenza A NOT DETECTED NOT DETECTED Final   Influenza B NOT DETECTED NOT DETECTED Final   Parainfluenza Virus 1 NOT DETECTED NOT DETECTED Final   Parainfluenza Virus 2 NOT DETECTED NOT DETECTED Final   Parainfluenza Virus 3 NOT DETECTED NOT DETECTED Final   Parainfluenza Virus 4 NOT DETECTED NOT DETECTED Final   Respiratory Syncytial Virus NOT DETECTED NOT DETECTED Final   Bordetella pertussis NOT DETECTED NOT DETECTED Final   Bordetella Parapertussis NOT DETECTED NOT DETECTED Final   Chlamydophila pneumoniae NOT DETECTED NOT DETECTED Final   Mycoplasma pneumoniae NOT DETECTED NOT  DETECTED Final    Comment: Performed at [facility], [address] [place], [place] [address]  MRSA Next Gen by PCR, Nasal     Status: None   Collection Time: 03/24/24  1:13 AM   Specimen: Nasal Swab  Result Value Ref Range Status   MRSA by PCR Next Gen NOT DETECTED NOT DETECTED Final    Comment: (NOTE) The GeneXpert MRSA Assay (FDA approved for NASAL specimens only), is one component of a comprehensive MRSA colonization surveillance program. It is not intended to diagnose MRSA infection nor to guide or monitor treatment for MRSA infections. Test performance is not FDA approved in patients less than 8 years old. Performed at [facility], [address] [place], [place] [address]          Radiology Studies: CT Angio Chest PE W and/or Wo Contrast Result Date: 03/23/2024 EXAM: CTA of the Chest with contrast for PE 03/23/2024 05:16:34 PM TECHNIQUE: CTA of the chest was performed without and with the administration of 100 mL of iohexol  (OMNIPAQUE ) 350 MG/ML injection. Multiplanar reformatted images are provided for review. MIP images are provided for review. Automated exposure control, iterative reconstruction, and/or weight based adjustment of the mA/kV was utilized to reduce the radiation dose to as low as reasonably achievable. COMPARISON: CTA chest dated 01/16/2024 and CT chest dated 12/13/2022. CLINICAL HISTORY: Pulmonary embolism (PE) suspected, high prob. FINDINGS: PULMONARY ARTERIES: Pulmonary arteries are adequately opacified for evaluation. No pulmonary embolism. Enlargement of the main pulmonary artery, suggesting pulmonary arterial hypertension. MEDIASTINUM: Stable moderate cardiomegaly. Thoracic aortic atherosclerosis. The pericardium demonstrates no acute abnormality. LYMPH NODES: No mediastinal, hilar or axillary lymphadenopathy. LUNGS AND PLEURA: 10 mm right middle lobe nodule (image 49), similar versus minimally progressive from recent CT, previously 4 mm in  2024. Mild linear and patchy opacities in the inferior right middle lobe, lingula, and bilateral lower lobes, favoring atelectasis/scarring. Additional subpleural patchy opacities inferiorly in the right upper lobe (image 42), suggesting mild infection/pneumonia, or possibly postinfectious/inflammatory scarring. No pleural effusion or pneumothorax. UPPER ABDOMEN: Limited images of the upper abdomen are unremarkable. SOFT  TISSUES AND BONES: Degenerative changes of the visualized thoracolumbar spine. No acute soft tissue abnormality. IMPRESSION: 1. No pulmonary embolism. 2. 10 mm right middle lobe nodule, progressive from 2024, suspicious. Consider PET-CT or 3 month follow-up CT chest for further evaluation. 3. Subpleural patchy opacities inferiorly in the right upper lobe, which may reflect mild infection or postinfectious/inflammatory scarring. 4. Additional ancillary findings, as above. Electronically signed by: Pinkie Pebbles MD 03/23/2024 06:04 PM EST RP Workstation: [id number]   CT ABDOMEN PELVIS W CONTRAST Result Date: 03/23/2024 EXAM: CT ABDOMEN AND PELVIS WITH CONTRAST 03/23/2024 05:16:34 PM TECHNIQUE: CT of the abdomen and pelvis was performed with the administration of intravenous contrast. Multiplanar reformatted images are provided for review. Automated exposure control, iterative reconstruction, and/or weight-based adjustment of the mA/kV was utilized to reduce the radiation dose to as low as reasonably achievable. COMPARISON: CT abdomen and pelvis 01/16/2024. CLINICAL HISTORY: Abdominal/flank pain, stone suspected. FINDINGS: LOWER CHEST: Atelectasis in the lung bases. There is a 9 mm right middle lobe nodule which is unchanged from prior. LIVER: The liver is enlarged with diffuse fatty infiltration. GALLBLADDER AND BILE DUCTS: Gallbladder is unremarkable. No biliary ductal dilatation. SPLEEN: No acute abnormality. PANCREAS: There is a 9 mm focal hypodensity within the neck of the pancreas image  3/30 which appears unchanged. ADRENAL GLANDS: No acute abnormality. KIDNEYS, URETERS AND BLADDER: No stones in the kidneys or ureters. No hydronephrosis. No perinephric or periureteral stranding. Urinary bladder is unremarkable. GI AND BOWEL: Stomach demonstrates no acute abnormality. The appendix appears normal. There is no bowel obstruction. PERITONEUM AND RETROPERITONEUM: No ascites. No free air. VASCULATURE: Aorta is normal in caliber. LYMPH NODES: No lymphadenopathy. REPRODUCTIVE ORGANS: The uterus is surgically absent. BONES AND SOFT TISSUES: No acute osseous abnormality. No focal soft tissue abnormality. IMPRESSION: 1. No acute findings in the abdomen or pelvis. 2. Unchanged 9 mm right middle lobe pulmonary nodule; no routine follow-up recommended per Fleischner Society Guidelines. 3. 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 Electronically signed by: Greig Pique MD 03/23/2024 05:53 PM EST RP Workstation: [id number]   DG Chest Portable 1 View Result Date: 03/23/2024 CLINICAL DATA:  dyspnea, cough EXAM: PORTABLE CHEST 1 VIEW COMPARISON:  March 12, 2024 fall January 20, 2024 FINDINGS: Evaluation is limited by underpenetration and positioning. The cardiomediastinal silhouette is unchanged enlarged in contour.Low lung volumes. No pleural effusion. No pneumothorax. No acute pleuroparenchymal abnormality. Neck surgical clips. IMPRESSION: No acute cardiopulmonary abnormality. Electronically Signed   By: Corean Salter M.D.   On: 03/23/2024 14:27        Scheduled Meds:  aspirin  EC  81 mg Oral Daily   busPIRone   30 mg Oral BID   carvedilol   6.25 mg Oral BID   cetirizine   10 mg Oral QHS   dextromethorphan -guaiFENesin   1 tablet Oral BID   diclofenac  Sodium  4 g Topical QID   dicyclomine   20 mg Oral BID   doxycycline   100 mg Oral Q12H   enoxaparin  (LOVENOX ) injection  70 mg Subcutaneous  Q24H   ferrous sulfate   325 mg Oral Daily   gabapentin   100 mg Oral BID   levothyroxine   125 mcg Oral Q0600   lidocaine   1 patch Transdermal Q24H   lurasidone   20 mg Oral QHS   pantoprazole   40 mg Oral Daily   potassium chloride   20 mEq Oral Daily   rosuvastatin   10 mg  Oral QPM   umeclidinium-vilanterol  1 puff Inhalation Daily   Continuous Infusions:  cefTRIAXone  (ROCEPHIN )  IV            Montrez Marietta, MD [facility] 03/24/2024, 8:22 AM

## 2024-03-25 ENCOUNTER — Other Ambulatory Visit (HOSPITAL_COMMUNITY): Payer: Self-pay

## 2024-03-25 ENCOUNTER — Ambulatory Visit (HOSPITAL_COMMUNITY): Admission: RE | Admit: 2024-03-25 | Discharge: 2024-03-25

## 2024-03-25 LAB — BASIC METABOLIC PANEL WITH GFR
Anion gap: 10 (ref 5–15)
BUN: 26 mg/dL — ABNORMAL HIGH (ref 8–23)
CO2: 39 mmol/L — ABNORMAL HIGH (ref 22–32)
Calcium: 9.9 mg/dL (ref 8.9–10.3)
Chloride: 90 mmol/L — ABNORMAL LOW (ref 98–111)
Creatinine, Ser: 0.97 mg/dL (ref 0.44–1.00)
GFR, Estimated: >60 mL/min (ref >60)
Glucose, Bld: 119 mg/dL — ABNORMAL HIGH (ref 70–99)
Potassium: 3.8 mmol/L (ref 3.5–5.1)
Sodium: 139 mmol/L (ref 135–145)

## 2024-03-25 LAB — GLUCOSE, CAPILLARY: Glucose-Capillary: 144 mg/dL — ABNORMAL HIGH (ref 70–99)

## 2024-03-25 MED ORDER — CEFDINIR 300 MG PO CAPS
300.0000 mg | ORAL_CAPSULE | Freq: Two times a day (BID) | ORAL | 0 refills | Status: DC
  Filled 2024-03-25: qty 10, 5d supply, fill #0

## 2024-03-25 MED ORDER — LIDOCAINE 5 % EX PTCH
1.0000 | MEDICATED_PATCH | CUTANEOUS | 0 refills | Status: AC
  Filled 2024-03-25: qty 10, 10d supply, fill #0

## 2024-03-25 MED ORDER — HYDROCODONE-ACETAMINOPHEN 5-325 MG PO TABS
1.0000 | ORAL_TABLET | ORAL | 0 refills | Status: DC | PRN
  Filled 2024-03-25: qty 14, 3d supply, fill #0

## 2024-03-25 MED ORDER — VITAMIN D (ERGOCALCIFEROL) 1.25 MG (50000 UNIT) PO CAPS
50000.0000 [IU] | ORAL_CAPSULE | ORAL | Status: DC
  Administered 2024-03-25: 11:00:00 50000 [IU] via ORAL
  Filled 2024-03-25: qty 1

## 2024-03-25 NOTE — Plan of Care (Signed)
°  Problem: Activity: Goal: Ability to tolerate increased activity will improve Outcome: Adequate for Discharge   Problem: Clinical Measurements: Goal: Ability to maintain a body temperature in the normal range will improve Outcome: Adequate for Discharge   Problem: Respiratory: Goal: Ability to maintain adequate ventilation will improve Outcome: Adequate for Discharge Goal: Ability to maintain a clear airway will improve Outcome: Adequate for Discharge   Problem: Education: Goal: Knowledge of General Education information will improve Description: Including pain rating scale, medication(s)/side effects and non-pharmacologic comfort measures Outcome: Adequate for Discharge   Problem: Health Behavior/Discharge Planning: Goal: Ability to manage health-related needs will improve Outcome: Adequate for Discharge   Problem: Clinical Measurements: Goal: Ability to maintain clinical measurements within normal limits will improve Outcome: Adequate for Discharge Goal: Will remain free from infection Outcome: Adequate for Discharge Goal: Diagnostic test results will improve Outcome: Adequate for Discharge Goal: Respiratory complications will improve Outcome: Adequate for Discharge Goal: Cardiovascular complication will be avoided Outcome: Adequate for Discharge   Problem: Activity: Goal: Risk for activity intolerance will decrease Outcome: Adequate for Discharge   Problem: Nutrition: Goal: Adequate nutrition will be maintained Outcome: Adequate for Discharge   Problem: Coping: Goal: Level of anxiety will decrease Outcome: Adequate for Discharge   Problem: Elimination: Goal: Will not experience complications related to bowel motility Outcome: Adequate for Discharge Goal: Will not experience complications related to urinary retention Outcome: Adequate for Discharge

## 2024-03-25 NOTE — TOC Transition Note (Addendum)
 Transition of Care [facility]) - Discharge Note   Patient Details  Name: Michele Owens MRN: [id number] Date of Birth: [birth year]  Transition of [facility]) CM/SW Contact:  Tom-Johnson, Harvest Muskrat, RN Phone Number: 03/25/2024, 1:00 PM   Clinical Narrative:     Patient is scheduled for discharge today.  Readmission Risk Assessment done. Home health recommended, patient declined, states she prefers to do Outpatient PT and chose [facility], info on AVS. Hospital f/u and discharge instructions on AVS. Medicaid Transportation info to schedule Outpatient PT appointment on AVS.  Prescriptions sent to [facility] pharmacy and patient will receive meds prior discharge. PTAR scheduled to transport at discharge.  No further ICM needs noted.       Final next level of care: OP Rehab Barriers to Discharge: Barriers Resolved   Patient Goals and CMS Choice Patient states their goals for this hospitalization and ongoing recovery are:: To return home CMS Medicare.gov Compare Post Acute Care list provided to:: Patient Choice offered to / list presented to : Patient      Discharge Placement                Patient to be transferred to facility by: PTAR      Discharge Plan and Services Additional resources added to the After Visit Summary for                  DME Arranged: Other see comment (Home Humidification) DME Agency: [facility] Date DME Agency Contacted: 03/25/24 Time DME Agency Contacted: [phone] Representative spoke with at DME Agency: Marolyn HH Arranged: Refused HH HH Agency: NA        Social Drivers of Health (SDOH) Interventions SDOH Screenings   Food Insecurity: No Food Insecurity (03/24/2024)  Housing: High Risk (03/24/2024)  Transportation Needs: Unmet Transportation Needs (03/24/2024)  Utilities: Not At Risk (03/24/2024)  Alcohol Screen: Low Risk (10/24/2022)  Depression (PHQ2-9): Medium Risk (12/25/2023)  Financial Resource Strain: Low Risk (10/24/2022)   Social Connections: Moderately Isolated (01/22/2024)  Tobacco Use: Medium Risk (03/19/2024)     Readmission Risk Interventions    03/25/2024   12:35 PM 01/23/2024   12:36 PM 07/09/2022   10:29 AM  Readmission Risk Prevention Plan  Transportation Screening Complete Complete Complete  PCP or Specialist Appt within 3-5 Days   Complete  HRI or Home Care Consult   Complete  Social Work Consult for Recovery Care Planning/Counseling   Complete  Palliative Care Screening   Not Applicable  Medication Review Oceanographer) Referral to Pharmacy Complete Not Complete  PCP or Specialist appointment within 3-5 days of discharge Complete Complete   HRI or Home Care Consult Complete Complete   SW Recovery Care/Counseling Consult Complete    Palliative Care Screening Not Applicable Not Applicable   Skilled Nursing Facility Not Applicable Not Applicable

## 2024-03-25 NOTE — Discharge Summary (Addendum)
 Physician Discharge Summary  Michele Owens FMW:[id number] DOB: December 30, 1960 DOA: 03/23/2024  PCP: Michele Beverley NOVAK, MD  Admit date: 03/23/2024 Discharge date: 03/25/2024  Admitted From: Home Disposition: Home with [facility] PT  Recommendations for Outpatient Follow-up:  Follow up with PCP in 1 week with repeat CBC/BMP The diuretics are on hold along with losartan  due to AKI.  Please resume as appropriate Follow up in ED if symptoms worsen or new appear   Home Health: Yes Discharge Condition: Stable CODE STATUS: Full Diet recommendation: Low-salt diet  Brief/Interim Summary:  Michele Owens is a 63 y.o. female with medical history significant for morbid obesity, HTN, OSA, COPD, chronic hypoxic respiratory failure on 2 L [place], diastolic HF, gout, bipolar, SVT, migraines, GERD, history of DVT, anxiety and depression who presented to the ED for evaluation of chest pain and generalized weakness.  Patient reports that she has been falling recently due to her legs giving out on her while ambulating.  She also complains of shortness of breath, chest discomfort and bloodstained sputum over the last few days.  She also endorsed some back pain and migraine headache but denies any fevers, chills, abdominal pain, nausea, vomiting, leg swelling or visual changes. Reports that she was not responding well to her torsemide  so the dose was increased by her heart failure team last week.   ED Course: Initial vitals show patient afebrile, RR 14-22, HR 70-80s, SBP 90-110s, SpO2 94% on 2 L. Initial labs significant for BUNs/creatinine 36/1.30, glucose 175, WBC 15.1, UA unremarkable. EKG shows sinus rhythm with prolonged QTc of 540. CXR shows no active disease.  CTA PE study shows pleural patchy opacities inferiorly in the right upper lobe.  CT A/P with no acute intra-abdominal pathology.  Pt received IV Rocephin , oral doxycycline , oxycodone , IV LR 500 cc bolus and IV NS 500 cc bolus.   Patient was admitted to  telemetry.  Diuretics were held due to AKI.  Patient was continued on IV antibiotics.  Her symptoms improved and she was discharged to home.  She is set up for home health physical therapy   Discharge Diagnoses:  Principal Problem:   Community acquired pneumonia Active Problems:   AKI (acute kidney injury)   Morbid obesity with BMI of 50.0-59.9, adult (HCC)   Generalized weakness  # Community-acquired pneumonia - Pt presented with 2 days of chest discomfort, productive cough and generalized weakness - Chest imaging shows signs of patchy opacities inferiorly in the right upper lobe - Treated with Rocephin , Doxy, discharged on Omnicef  for 5 days. - Continue supplemental oxygen , patient already has oxygen  at home.   # AKI - Creatinine slightly elevated to 1.30 from recent baseline of 0.8-0.9 - Likely secondary to diuretics, losartan . -Patient received some gentle hydration. - Continue to hold losartan , torsemide  and spironolactone  today in the setting of AKI -Discussed with patient that she need to follow-up with PCP in the next few days, obtain laboratory work and resume diuretics/losartan  at the discretion of PCP.    # HTN - BP still soft with SBP in the 90-110s - Holding BP meds as above   # T2DM, well-controlled - Last A1c 6.9% 2 months ago - Carb modified diet   # HFpEF - Patient slightly dry on exam with soft BP - Torsemide  recently increased from 60 mg to 80 mg twice daily - Hold torsemide  for now and resume when appropriate   # COPD # Chronic hypoxic respiratory failure - Respiratory status remained stable, remains on home 2  L with no signs of acute COPD exacerbation - Continue Anoro Ellipta , and PRN DuoNebs - Continue supplemental O2   # Hx of SVT - EKG shows normal sinus rhythm, HR stable in the 70s to 80s - Holding Coreg  due to hypotension.   # HLD -Continue rosuvastatin    # Prolonged Qtc - EKG on admission shows prolonged QTc of 540 - Avoid QTc  prolonging meds - Trend and repeat  electrolytes - Telemetry   # Hypothyroidism - Continue Synthroid    # GERD - Continue PPI   # Allergic rhinitis - Continue Xyzal    # Bipolar disorder # Anxiety and depression - Continue BuSpar  and Latuda    # Hx of migraine pains - Continue as needed Imitrex    # OSA - Continue supplemental O2 at bedtime   # Generalized weakness - In the setting of acute illness - PT/OT eval and treat   # Hx of DVT - Not on anticoagulation   # Class III obesity Body mass index is 52.75 kg/m.  Discharge Instructions  Discharge Instructions     (HEART FAILURE PATIENTS) Call MD:  Anytime you have any of the following symptoms: 1) 3 pound weight gain in 24 hours or 5 pounds in 1 week 2) shortness of breath, with or without a dry hacking cough 3) swelling in the hands, feet or stomach 4) if you have to sleep on extra pillows at night in order to breathe.   Complete by: As directed    Ambulatory referral to Physical Therapy   Complete by: As directed    Call MD for:  difficulty breathing, headache or visual disturbances   Complete by: As directed    Call MD for:  extreme fatigue   Complete by: As directed    Call MD for:  persistant dizziness or light-headedness   Complete by: As directed    Diet - low sodium heart healthy   Complete by: As directed    Discharge instructions   Complete by: As directed    1.  Continue to hold diuretics at this time due to acute kidney injury.  Hold torsemide , spironolactone , metolazone .  Also hold losartan  due to acute kidney injury.  Please follow-up with primary care provider in the next few days to reevaluate, laboratory work and likely resumption of diuretics. 2.  Completed a course of antibiotics.  Cefdinir  prescribed for 5 days after discharge.   Increase activity slowly   Complete by: As directed       Allergies as of 03/25/2024       Reactions   Bee Venom Anaphylaxis   All over my body (swelling)    Fioricet  [butalbital -apap-caffeine ] Nausea And Vomiting, Rash   Ibuprofen  Rash, Other (See Comments)   Severe rash   Lamisil  [terbinafine ] Rash, Other (See Comments)   Pt states this causes her to feel funny   Nsaids Other (See Comments)   Per MD's orders         Medication List     PAUSE taking these medications    losartan  25 MG tablet Wait to take this until your doctor or other care provider tells you to start again. Commonly known as: COZAAR  Take 1 tablet (25 mg total) by mouth daily.   metolazone  2.5 MG tablet Wait to take this until your doctor or other care provider tells you to start again. Commonly known as: ZAROXOLYN  Take 1 tablet (2.5 mg total) by mouth as directed. TAKE ONE DOSE OF METOLAZONE  2.5MG  TODAY, AND TOMORROW WITH  40MEQ (2) TABLETS OF POTASSIUM WITH THIS   spironolactone  25 MG tablet Wait to take this until your doctor or other care provider tells you to start again. Commonly known as: ALDACTONE  Take 0.5 tablets (12.5 mg total) by mouth daily.   torsemide  20 MG tablet Wait to take this until your doctor or other care provider tells you to start again. Commonly known as: DEMADEX  Take 4 tablets (80 mg total) by mouth 2 (two) times daily.       TAKE these medications    Accu-Chek Guide Test test strip Generic drug: glucose blood USE TO CHECK BLOOD SUGAR 3 TIMES DAILY   Accu-Chek Softclix Lancets lancets Use to check blood sugar 3 times daily   acetaminophen  325 MG tablet Commonly known as: TYLENOL  Take 2 tablets (650 mg total) by mouth every 6 (six) hours as needed for mild pain (pain score 1-3) or fever (or Fever >/= 101).   albuterol  108 (90 Base) MCG/ACT inhaler Commonly known as: VENTOLIN  HFA Inhale 1-2 puffs into the lungs every 6 (six) hours as needed for wheezing or shortness of breath.   albuterol  (2.5 MG/3ML) 0.083% nebulizer solution Commonly known as: PROVENTIL  Take 3 mLs (2.5 mg total) by nebulization every 6 (six) hours as  needed for wheezing or shortness of breath.   allopurinol  100 MG tablet Commonly known as: ZYLOPRIM  Take 1 tablet (100 mg total) by mouth daily.   aspirin  EC 81 MG tablet Take 1 tablet (81 mg total) by mouth daily. Swallow whole.   blood glucose meter kit and supplies Kit Dispense based on patient and insurance preference. Use up to four times daily as directed.   Blood Glucose Monitoring Suppl Devi 1 each by Does not apply route in the morning, at noon, and at bedtime. May substitute to any manufacturer covered by patient's insurance.   budesonide  0.25 MG/2ML nebulizer solution Commonly known as: PULMICORT  Inhale 2 mLs (0.25 mg total) by nebulization 2 (two) times daily.   busPIRone  30 MG tablet Commonly known as: BUSPAR  Take 1 tablet (30 mg total) by mouth 2 (two) times daily.   carvedilol  6.25 MG tablet Commonly known as: COREG  Take 1 tablet (6.25 mg total) by mouth 2 (two) times daily.   cefdinir  300 MG capsule Commonly known as: OMNICEF  Take 1 capsule (300 mg total) by mouth 2 (two) times daily.   clotrimazole  1 % cream Commonly known as: LOTRIMIN  Apply topically 2 (two) times daily.   diclofenac  Sodium 1 % Gel Commonly known as: VOLTAREN  Apply 4 g topically 4 (four) times daily.   dicyclomine  20 MG tablet Commonly known as: BENTYL  Take 1 tablet (20 mg total) by mouth 2 (two) times daily.   EPINEPHrine  0.3 mg/0.3 mL Soaj injection Commonly known as: EPI-PEN Inject 0.3 mg into the muscle once as needed for anaphylaxis.   fenofibrate  145 MG tablet Commonly known as: TRICOR  Take 1 tablet (145 mg total) by mouth daily.   FeroSul 325 (65 Fe) MG tablet Generic drug: ferrous sulfate  Take 1 tablet (325 mg total) by mouth daily.   gabapentin  100 MG capsule Commonly known as: NEURONTIN  100 mg 2 (two) times daily.   HYDROcodone -acetaminophen  5-325 MG tablet Commonly known as: NORCO/VICODIN Take 1 tablet by mouth every 4 (four) hours as needed for severe pain  (pain score 7-10).   ipratropium-albuterol  0.5-2.5 (3) MG/3ML Soln Commonly known as: DUONEB Take 3 mLs by nebulization every 4 (four) hours as needed.   levocetirizine 5 MG tablet Commonly known as: XYZAL   TAKE 1 TABLET (5 MG TOTAL) BY MOUTH AT BEDTIME.   levothyroxine  125 MCG tablet Commonly known as: SYNTHROID  TAKE 1 TABLET (125 MCG TOTAL) BY MOUTH AT BEDTIME.   lidocaine  4 % Commonly known as: HM Lidocaine  Patch Place 1 patch onto the skin daily. What changed: Another medication with the same name was added. Make sure you understand how and when to take each.   lidocaine  5 % Commonly known as: Lidoderm  Place 1 patch onto the skin daily for 10 days. Remove & Discard patch within 12 hours or as directed by MD What changed: You were already taking a medication with the same name, and this prescription was added. Make sure you understand how and when to take each.   lurasidone  20 MG Tabs tablet Commonly known as: LATUDA  Take 1 tablet (20 mg total) by mouth at bedtime. Take with a snack. This is for mood and sleep.   nitroGLYCERIN  0.4 MG SL tablet Commonly known as: NITROSTAT  Place 1 tablet (0.4 mg total) under the tongue every 5 (five) minutes as needed for chest pain.   Nurtec 75 MG Tbdp Generic drug: Rimegepant Sulfate Take 1 tablet (75 mg total) by mouth every other day. Prevents and Treats Migraine Headaches   omeprazole  20 MG capsule Commonly known as: PRILOSEC TAKE 2 CAPSULES (40 MG TOTAL) BY MOUTH DAILY (2AM)   ondansetron  4 MG disintegrating tablet Commonly known as: ZOFRAN -ODT Take 1 tablet (4 mg total) by mouth every 8 (eight) hours as needed for nausea or vomiting.   OXYGEN  Inhale 2 L into the lungs continuous.   potassium chloride  10 MEQ tablet Commonly known as: KLOR-CON  M Take 2 tablets (20 mEq total) by mouth daily.   rizatriptan  10 MG tablet Commonly known as: Maxalt  Take 1 tablet (10 mg total) by mouth as needed for migraine. May repeat in 2 hours  if needed   rosuvastatin  10 MG tablet Commonly known as: CRESTOR  Take 1 tablet (10 mg total) by mouth every evening.   umeclidinium-vilanterol 62.5-25 MCG/ACT Aepb Commonly known as: ANORO ELLIPTA  Inhale 1 puff into the lungs daily.   Vitamin D  (Ergocalciferol ) 1.25 MG (50000 UNIT) Caps capsule Commonly known as: DRISDOL  Take 1 capsule (50,000 Units total) by mouth every 7 (seven) days.               Durable Medical Equipment  (From admission, onward)           Start     Ordered   03/25/24 1223  For home use only DME Other see comment  Once       Comments: Humidification for home Oxygen   Question:  Length of Need  Answer:  12 Months   03/25/24 1223            Follow-up Information     Michele Beverley NOVAK, MD. Schedule an appointment as soon as possible for a visit in 3 day(s).   Specialty: Family Medicine Contact information: [address] [place] [address] [phone]         [facility] Follow up.   Specialty: Rehabilitation Why: Call to schedule your first appointment. Contact information: [address] [place]  [address] [phone]        Medicaid Transportation Follow up.   Why: Call to schedule for your Outpatient PT Transportation. Contact information: [address]  [place], [place] [address]  [phone]  Allergies[1]  Consultations:    Procedures/Studies: CT Angio Chest PE W and/or Wo Contrast Result Date: 03/23/2024 EXAM: CTA of the Chest with contrast for PE 03/23/2024 05:16:34 PM TECHNIQUE: CTA of the chest was performed without and with the administration of 100 mL of iohexol  (OMNIPAQUE ) 350 MG/ML injection. Multiplanar reformatted images are provided for review. MIP images are provided for review. Automated exposure control, iterative reconstruction, and/or weight based adjustment of the mA/kV was utilized to  reduce the radiation dose to as low as reasonably achievable. COMPARISON: CTA chest dated 01/16/2024 and CT chest dated 12/13/2022. CLINICAL HISTORY: Pulmonary embolism (PE) suspected, high prob. FINDINGS: PULMONARY ARTERIES: Pulmonary arteries are adequately opacified for evaluation. No pulmonary embolism. Enlargement of the main pulmonary artery, suggesting pulmonary arterial hypertension. MEDIASTINUM: Stable moderate cardiomegaly. Thoracic aortic atherosclerosis. The pericardium demonstrates no acute abnormality. LYMPH NODES: No mediastinal, hilar or axillary lymphadenopathy. LUNGS AND PLEURA: 10 mm right middle lobe nodule (image 49), similar versus minimally progressive from recent CT, previously 4 mm in 2024. Mild linear and patchy opacities in the inferior right middle lobe, lingula, and bilateral lower lobes, favoring atelectasis/scarring. Additional subpleural patchy opacities inferiorly in the right upper lobe (image 42), suggesting mild infection/pneumonia, or possibly postinfectious/inflammatory scarring. No pleural effusion or pneumothorax. UPPER ABDOMEN: Limited images of the upper abdomen are unremarkable. SOFT TISSUES AND BONES: Degenerative changes of the visualized thoracolumbar spine. No acute soft tissue abnormality. IMPRESSION: 1. No pulmonary embolism. 2. 10 mm right middle lobe nodule, progressive from 2024, suspicious. Consider PET-CT or 3 month follow-up CT chest for further evaluation. 3. Subpleural patchy opacities inferiorly in the right upper lobe, which may reflect mild infection or postinfectious/inflammatory scarring. 4. Additional ancillary findings, as above. Electronically signed by: Pinkie Pebbles MD 03/23/2024 06:04 PM EST RP Workstation: [id number]   CT ABDOMEN PELVIS W CONTRAST Result Date: 03/23/2024 EXAM: CT ABDOMEN AND PELVIS WITH CONTRAST 03/23/2024 05:16:34 PM TECHNIQUE: CT of the abdomen and pelvis was performed with the administration of intravenous contrast.  Multiplanar reformatted images are provided for review. Automated exposure control, iterative reconstruction, and/or weight-based adjustment of the mA/kV was utilized to reduce the radiation dose to as low as reasonably achievable. COMPARISON: CT abdomen and pelvis 01/16/2024. CLINICAL HISTORY: Abdominal/flank pain, stone suspected. FINDINGS: LOWER CHEST: Atelectasis in the lung bases. There is a 9 mm right middle lobe nodule which is unchanged from prior. LIVER: The liver is enlarged with diffuse fatty infiltration. GALLBLADDER AND BILE DUCTS: Gallbladder is unremarkable. No biliary ductal dilatation. SPLEEN: No acute abnormality. PANCREAS: There is a 9 mm focal hypodensity within the neck of the pancreas image 3/30 which appears unchanged. ADRENAL GLANDS: No acute abnormality. KIDNEYS, URETERS AND BLADDER: No stones in the kidneys or ureters. No hydronephrosis. No perinephric or periureteral stranding. Urinary bladder is unremarkable. GI AND BOWEL: Stomach demonstrates no acute abnormality. The appendix appears normal. There is no bowel obstruction. PERITONEUM AND RETROPERITONEUM: No ascites. No free air. VASCULATURE: Aorta is normal in caliber. LYMPH NODES: No lymphadenopathy. REPRODUCTIVE ORGANS: The uterus is surgically absent. BONES AND SOFT TISSUES: No acute osseous abnormality. No focal soft tissue abnormality. IMPRESSION: 1. No acute findings in the abdomen or pelvis. 2. Unchanged 9 mm right middle lobe pulmonary nodule; no routine follow-up recommended per Fleischner Society Guidelines. 3. 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 Electronically signed by: Greig Pique MD 03/23/2024 05:53 PM EST RP Workstation:  [id number]   DG Chest Portable 1 View Result Date: 03/23/2024 CLINICAL DATA:  dyspnea, cough EXAM: PORTABLE CHEST 1 VIEW COMPARISON:  March 12, 2024 fall January 20, 2024 FINDINGS:  Evaluation is limited by underpenetration and positioning. The cardiomediastinal silhouette is unchanged enlarged in contour.Low lung volumes. No pleural effusion. No pneumothorax. No acute pleuroparenchymal abnormality. Neck surgical clips. IMPRESSION: No acute cardiopulmonary abnormality. Electronically Signed   By: Corean Salter M.D.   On: 03/23/2024 14:27   DG Chest 2 View Result Date: 03/12/2024 CLINICAL DATA:  Shortness of breath EXAM: CHEST - 2 VIEW COMPARISON:  Chest x-ray 01/21/2024 FINDINGS: Examination is limited secondary to patient positioning and overlying soft tissues. Heart appears enlarged, unchanged. There is no definite lung infiltrate, pleural effusion or pneumothorax. No acute fractures are seen. IMPRESSION: 1. Limited exam. No definite acute cardiopulmonary process. 2. Cardiomegaly. Electronically Signed   By: Greig Pique M.D.   On: 03/12/2024 23:43   CARDIAC CATHETERIZATION Result Date: 03/03/2024 1. 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  2. Moderate pulmonary venous hypertension. 3. Preserved cardiac output Increase torsemide  to 60 mg bid and add KCl 20 daily.  BMET in 1 week.      Subjective:  Patient feels ready to go home.  She is overall worried about lower extremity weakness and believes therapy will help.  She also has questions about diuretics.  We discussed that diuretics need to be held for a couple of days due to acute kidney injury.  Discharge Exam: Vitals:   03/25/24 0525 03/25/24 0905  BP: (!) 142/75   Pulse: 74   Resp: 18   Temp: 97.9 F (36.6 C)   SpO2: 92% 94%    General exam: Appears calm and comfortable  Respiratory system: Bilateral decreased breath sounds at bases Cardiovascular system: S1 & S2 heard, Rate controlled Gastrointestinal system: Abdomen is nondistended, soft and nontender. Normal bowel sounds heard. Extremities: No cyanosis, clubbing, edema  Central nervous system: Alert and oriented. No focal neurological  deficits. Moving extremities Skin: No rashes, lesions or ulcers Psychiatry: Judgement and insight appear normal. Mood & affect appropriate.    The results of significant diagnostics from this hospitalization (including imaging, microbiology, ancillary and laboratory) are listed below for reference.     Microbiology: Recent Results (from the past 240 hours)  Resp panel by RT-PCR (RSV, Flu A&B, Covid) Anterior Nasal Swab     Status: None   Collection Time: 03/23/24  1:44 PM   Specimen: Anterior Nasal Swab  Result Value Ref Range Status   SARS Coronavirus 2 by RT PCR NEGATIVE NEGATIVE Final   Influenza A by PCR NEGATIVE NEGATIVE Final   Influenza B by PCR NEGATIVE NEGATIVE Final    Comment: (NOTE) The Xpert Xpress SARS-CoV-2/FLU/RSV plus assay is intended as an aid in the diagnosis of influenza from Nasopharyngeal swab specimens and should not be used as a sole basis for treatment. Nasal washings and aspirates are unacceptable for Xpert Xpress SARS-CoV-2/FLU/RSV testing.  Fact Sheet for Patients: bloggercourse.com  Fact Sheet for Healthcare Providers: seriousbroker.it  This test is not yet approved or cleared by the [place]  FDA and has been authorized for detection and/or diagnosis of SARS-CoV-2 by FDA under an Emergency Use Authorization (EUA). This EUA will remain in effect (meaning this test can be used) for the duration of the COVID-19 declaration under Section 564(b)(1) of the Act, 21 U.S.C. section 360bbb-3(b)(1), unless the authorization is terminated or revoked.     Resp Syncytial Virus  by PCR NEGATIVE NEGATIVE Final    Comment: (NOTE) Fact Sheet for Patients: bloggercourse.com  Fact Sheet for Healthcare Providers: seriousbroker.it  This test is not yet approved or cleared by the [place]  FDA and has been authorized for detection and/or diagnosis of  SARS-CoV-2 by FDA under an Emergency Use Authorization (EUA). This EUA will remain in effect (meaning this test can be used) for the duration of the COVID-19 declaration under Section 564(b)(1) of the Act, 21 U.S.C. section 360bbb-3(b)(1), unless the authorization is terminated or revoked.  Performed at [facility], [address], [place], [place] [address]   Respiratory (~20 pathogens) panel by PCR     Status: None   Collection Time: 03/24/24 12:06 AM   Specimen: Respiratory  Result Value Ref Range Status   Adenovirus NOT DETECTED NOT DETECTED Final   Coronavirus 229E NOT DETECTED NOT DETECTED Final    Comment: (NOTE) The Coronavirus on the Respiratory Panel, DOES NOT test for the novel  Coronavirus (2019 nCoV)    Coronavirus HKU1 NOT DETECTED NOT DETECTED Final   Coronavirus NL63 NOT DETECTED NOT DETECTED Final   Coronavirus OC43 NOT DETECTED NOT DETECTED Final   Metapneumovirus NOT DETECTED NOT DETECTED Final   Rhinovirus / Enterovirus NOT DETECTED NOT DETECTED Final   Influenza A NOT DETECTED NOT DETECTED Final   Influenza B NOT DETECTED NOT DETECTED Final   Parainfluenza Virus 1 NOT DETECTED NOT DETECTED Final   Parainfluenza Virus 2 NOT DETECTED NOT DETECTED Final   Parainfluenza Virus 3 NOT DETECTED NOT DETECTED Final   Parainfluenza Virus 4 NOT DETECTED NOT DETECTED Final   Respiratory Syncytial Virus NOT DETECTED NOT DETECTED Final   Bordetella pertussis NOT DETECTED NOT DETECTED Final   Bordetella Parapertussis NOT DETECTED NOT DETECTED Final   Chlamydophila pneumoniae NOT DETECTED NOT DETECTED Final   Mycoplasma pneumoniae NOT DETECTED NOT DETECTED Final    Comment: Performed at [facility], [address], [place], [place] [address]  MRSA Next Gen by PCR, Nasal     Status: None   Collection Time: 03/24/24  1:13 AM   Specimen: Nasal Swab  Result Value Ref Range Status   MRSA by PCR Next Gen NOT DETECTED NOT DETECTED Final    Comment: (NOTE) The  GeneXpert MRSA Assay (FDA approved for NASAL specimens only), is one component of a comprehensive MRSA colonization surveillance program. It is not intended to diagnose MRSA infection nor to guide or monitor treatment for MRSA infections. Test performance is not FDA approved in patients less than 24 years old. Performed at [facility], [address], [place], [place] [address]      Labs: BNP (last 3 results) Recent Labs    03/12/24 2311 03/19/24 1318 03/23/24 1344  BNP 12.1 8.7 6.9   Basic Metabolic Panel: Recent Labs  Lab 03/19/24 1318 03/23/24 1344 03/23/24 1528 03/24/24 0602 03/25/24 1105  NA 136 135 137 139 139  K 3.7 3.6 3.4* 3.5 3.8  CL 83* 81* 81* 86* 90*  CO2 43* 41*  --  43* 39*  GLUCOSE 185* 175* 173* 129* 119*  BUN 35* 36* 42* 34* 26*  CREATININE 1.36* 1.30* 1.40* 1.22* 0.97  CALCIUM  8.9 8.4*  --  8.4* 9.9  MG  --   --   --  1.8  --    Liver Function Tests: Recent Labs  Lab 03/23/24 1344  AST 33  ALT 22  ALKPHOS 50  BILITOT 0.6  PROT 7.4  ALBUMIN  4.1   No results for input(s): LIPASE, AMYLASE in the last 168 hours. No results for input(s): AMMONIA  in the last 168 hours. CBC: Recent Labs  Lab 03/23/24 1522 03/23/24 1528 03/24/24 0602  WBC 15.1*  --  11.9*  NEUTROABS 11.5*  --   --   HGB 12.2 15.0 12.1  HCT 39.4 44.0 40.1  MCV 98.5  --  100.8*  PLT 300  --  266   Cardiac Enzymes: No results for input(s): CKTOTAL, CKMB, CKMBINDEX, TROPONINI in the last 168 hours. BNP: Invalid input(s): POCBNP CBG: Recent Labs  Lab 03/25/24 0752  GLUCAP 144*   D-Dimer No results for input(s): DDIMER in the last 72 hours. Hgb A1c No results for input(s): HGBA1C in the last 72 hours. Lipid Profile No results for input(s): CHOL, HDL, LDLCALC, TRIG, CHOLHDL, LDLDIRECT in the last 72 hours. Thyroid  function studies No results for input(s): TSH, T4TOTAL, T3FREE, THYROIDAB in the last 72 hours.  Invalid  input(s): FREET3 Anemia work up No results for input(s): VITAMINB12, FOLATE, FERRITIN, TIBC, IRON, RETICCTPCT in the last 72 hours. Urinalysis    Component Value Date/Time   COLORURINE YELLOW 03/23/2024 1344   APPEARANCEUR CLEAR 03/23/2024 1344   LABSPEC 1.017 03/23/2024 1344   PHURINE 5.0 03/23/2024 1344   GLUCOSEU NEGATIVE 03/23/2024 1344   HGBUR NEGATIVE 03/23/2024 1344   BILIRUBINUR NEGATIVE 03/23/2024 1344   KETONESUR NEGATIVE 03/23/2024 1344   PROTEINUR NEGATIVE 03/23/2024 1344   UROBILINOGEN 0.2 02/28/2012 2004   NITRITE NEGATIVE 03/23/2024 1344   LEUKOCYTESUR NEGATIVE 03/23/2024 1344   Sepsis Labs Recent Labs  Lab 03/23/24 1522 03/24/24 0602  WBC 15.1* 11.9*   Microbiology Recent Results (from the past 240 hours)  Resp panel by RT-PCR (RSV, Flu A&B, Covid) Anterior Nasal Swab     Status: None   Collection Time: 03/23/24  1:44 PM   Specimen: Anterior Nasal Swab  Result Value Ref Range Status   SARS Coronavirus 2 by RT PCR NEGATIVE NEGATIVE Final   Influenza A by PCR NEGATIVE NEGATIVE Final   Influenza B by PCR NEGATIVE NEGATIVE Final    Comment: (NOTE) The Xpert Xpress SARS-CoV-2/FLU/RSV plus assay is intended as an aid in the diagnosis of influenza from Nasopharyngeal swab specimens and should not be used as a sole basis for treatment. Nasal washings and aspirates are unacceptable for Xpert Xpress SARS-CoV-2/FLU/RSV testing.  Fact Sheet for Patients: bloggercourse.com  Fact Sheet for Healthcare Providers: seriousbroker.it  This test is not yet approved or cleared by the [place]  FDA and has been authorized for detection and/or diagnosis of SARS-CoV-2 by FDA under an Emergency Use Authorization (EUA). This EUA will remain in effect (meaning this test can be used) for the duration of the COVID-19 declaration under Section 564(b)(1) of the Act, 21 U.S.C. section 360bbb-3(b)(1), unless the  authorization is terminated or revoked.     Resp Syncytial Virus by PCR NEGATIVE NEGATIVE Final    Comment: (NOTE) Fact Sheet for Patients: bloggercourse.com  Fact Sheet for Healthcare Providers: seriousbroker.it  This test is not yet approved or cleared by the [place]  FDA and has been authorized for detection and/or diagnosis of SARS-CoV-2 by FDA under an Emergency Use Authorization (EUA). This EUA will remain in effect (meaning this test can be used) for the duration of the COVID-19 declaration under Section 564(b)(1) of the Act, 21 U.S.C. section 360bbb-3(b)(1), unless the authorization is terminated or revoked.  Performed at [facility], [address] [place], [place] [address]  Respiratory (~20 pathogens) panel by PCR     Status: None   Collection Time: 03/24/24 12:06 AM   Specimen: Respiratory  Result Value Ref Range Status   Adenovirus NOT DETECTED NOT DETECTED Final   Coronavirus 229E NOT DETECTED NOT DETECTED Final    Comment: (NOTE) The Coronavirus on the Respiratory Panel, DOES NOT test for the novel  Coronavirus (2019 nCoV)    Coronavirus HKU1 NOT DETECTED NOT DETECTED Final   Coronavirus NL63 NOT DETECTED NOT DETECTED Final   Coronavirus OC43 NOT DETECTED NOT DETECTED Final   Metapneumovirus NOT DETECTED NOT DETECTED Final   Rhinovirus / Enterovirus NOT DETECTED NOT DETECTED Final   Influenza A NOT DETECTED NOT DETECTED Final   Influenza B NOT DETECTED NOT DETECTED Final   Parainfluenza Virus 1 NOT DETECTED NOT DETECTED Final   Parainfluenza Virus 2 NOT DETECTED NOT DETECTED Final   Parainfluenza Virus 3 NOT DETECTED NOT DETECTED Final   Parainfluenza Virus 4 NOT DETECTED NOT DETECTED Final   Respiratory Syncytial Virus NOT DETECTED NOT DETECTED Final   Bordetella pertussis NOT DETECTED NOT DETECTED Final   Bordetella Parapertussis NOT DETECTED NOT DETECTED Final   Chlamydophila pneumoniae NOT  DETECTED NOT DETECTED Final   Mycoplasma pneumoniae NOT DETECTED NOT DETECTED Final    Comment: Performed at [facility], [address], [place], [place] [address]  MRSA Next Gen by PCR, Nasal     Status: None   Collection Time: 03/24/24  1:13 AM   Specimen: Nasal Swab  Result Value Ref Range Status   MRSA by PCR Next Gen NOT DETECTED NOT DETECTED Final    Comment: (NOTE) The GeneXpert MRSA Assay (FDA approved for NASAL specimens only), is one component of a comprehensive MRSA colonization surveillance program. It is not intended to diagnose MRSA infection nor to guide or monitor treatment for MRSA infections. Test performance is not FDA approved in patients less than 35 years old. Performed at [facility], [address], [place], [place] [address]      Time coordinating discharge: 35 minutes  SIGNED:   Derryl Duval, MD  [facility] 03/25/2024, 6:09 PM       [1]  Allergies Allergen Reactions   Bee Venom Anaphylaxis    All over my body (swelling)   Fioricet  [Butalbital -Apap-Caffeine ] Nausea And Vomiting and Rash   Ibuprofen  Rash and Other (See Comments)    Severe rash   Lamisil  [Terbinafine ] Rash and Other (See Comments)    Pt states this causes her to feel funny   Nsaids Other (See Comments)    Per MD's orders

## 2024-03-25 NOTE — Progress Notes (Signed)
 DISCHARGE NOTE HOME Michele Owens to be discharged Home per MD order. Discussed prescriptions and follow up appointments with the patient. Prescriptions given to patient; medication list explained in detail. Patient verbalized understanding.  Skin clean, dry and intact without evidence of skin break down, no evidence of skin tears noted. IV catheter discontinued intact. Site without signs and symptoms of complications. Dressing and pressure applied. Pt denies pain at the site currently. No complaints noted.  Patient free of lines, drains, and wounds.   An After Visit Summary (AVS) was printed and given to the patient. Patient escorted via wheelchair to discharge lounge. Pt has medications from [facility] pharmacy and she has notified her home pharmacy to adjust her bubble packs.   [name] A Proctor-Gann, RN

## 2024-03-26 ENCOUNTER — Telehealth: Payer: Self-pay

## 2024-03-26 LAB — LEGIONELLA PNEUMOPHILA SEROGP 1 UR AG: L. pneumophila Serogp 1 Ur Ag: NEGATIVE

## 2024-03-26 NOTE — Transitions of Care (Post Inpatient/ED Visit) (Signed)
° °  03/26/2024  Name: Michele Owens MRN: [id number] DOB: 1960-12-10  Today's TOC FU Call Status: Today's TOC FU Call Status:: Unsuccessful Call (1st Attempt) Unsuccessful Call (1st Attempt) Date: 03/26/24  Attempted to reach the patient regarding the most recent Inpatient/ED visit. Patient answered and with elevated voice requesting not to talk she would call if she needed anything.  Follow Up Plan: No further outreach attempts will be made at this time. We have been unable to contact the patient.  Richerd Fish, RN, BSN, CCM [facility], [facility] Management Coordinator Direct Dial: [phone]

## 2024-03-27 ENCOUNTER — Telehealth: Payer: Self-pay

## 2024-03-27 NOTE — Telephone Encounter (Signed)
 Copied from CRM #[id number]. Topic: General - Other >> Mar 27, 2024 11:02 AM Carlyon D wrote: Reason for CRM: Pt is calling in regards to her test strips she states she is not able to get them I seen noted that insurance is no longer covering the script, pt also very mad that no one tod her she no longer has a primary car dr? Pt states she's is reporting this and the office is very unprofessional for not making her aware of any of this

## 2024-03-27 NOTE — Telephone Encounter (Signed)
 Michele Owens,   Please see message below. I attempted to call patient but unable to reach patient

## 2024-03-27 NOTE — Telephone Encounter (Unsigned)
 Copied from CRM #[id number]. Topic: Clinical - Prescription Issue >> Mar 27, 2024 11:53 AM Carlyon D wrote: Reason for CRM: Pt is calling very upset  Can clinical team please provide any additional insight to pt on what happened in concerns to her PCP and being out of her sugar pill for 5 months also not getting her test strips,  can some one please reach out to the pt. I did try to add a note to previous crm but was not letting me . Please reach out to pt at earliest convenience

## 2024-03-28 ENCOUNTER — Ambulatory Visit (HOSPITAL_COMMUNITY): Admitting: Cardiology

## 2024-03-31 ENCOUNTER — Telehealth: Payer: Self-pay | Admitting: Family Medicine

## 2024-03-31 ENCOUNTER — Inpatient Hospital Stay: Admitting: Internal Medicine

## 2024-03-31 ENCOUNTER — Emergency Department (HOSPITAL_COMMUNITY)

## 2024-03-31 ENCOUNTER — Emergency Department (HOSPITAL_COMMUNITY)
Admission: EM | Admit: 2024-03-31 | Discharge: 2024-04-01 | Disposition: A | Discharge status: Home / Self Care | Attending: Emergency Medicine | Admitting: Emergency Medicine

## 2024-03-31 ENCOUNTER — Ambulatory Visit: Payer: Self-pay

## 2024-03-31 DIAGNOSIS — E119 Type 2 diabetes mellitus without complications: Secondary | ICD-10-CM | POA: Insufficient documentation

## 2024-03-31 DIAGNOSIS — I110 Hypertensive heart disease with heart failure: Secondary | ICD-10-CM | POA: Insufficient documentation

## 2024-03-31 DIAGNOSIS — Z87891 Personal history of nicotine dependence: Secondary | ICD-10-CM | POA: Insufficient documentation

## 2024-03-31 DIAGNOSIS — J189 Pneumonia, unspecified organism: Secondary | ICD-10-CM

## 2024-03-31 DIAGNOSIS — J181 Lobar pneumonia, unspecified organism: Secondary | ICD-10-CM | POA: Diagnosis not present

## 2024-03-31 DIAGNOSIS — R600 Localized edema: Secondary | ICD-10-CM | POA: Diagnosis not present

## 2024-03-31 DIAGNOSIS — Z955 Presence of coronary angioplasty implant and graft: Secondary | ICD-10-CM | POA: Insufficient documentation

## 2024-03-31 DIAGNOSIS — Z85850 Personal history of malignant neoplasm of thyroid: Secondary | ICD-10-CM | POA: Insufficient documentation

## 2024-03-31 DIAGNOSIS — R0602 Shortness of breath: Secondary | ICD-10-CM | POA: Diagnosis present

## 2024-03-31 DIAGNOSIS — I5032 Chronic diastolic (congestive) heart failure: Secondary | ICD-10-CM | POA: Insufficient documentation

## 2024-03-31 DIAGNOSIS — J4489 Other specified chronic obstructive pulmonary disease: Secondary | ICD-10-CM | POA: Insufficient documentation

## 2024-03-31 LAB — I-STAT VENOUS BLOOD GAS, ED
Acid-Base Excess: 8 mmol/L — ABNORMAL HIGH (ref 0.0–2.0)
Bicarbonate: 35.3 mmol/L — ABNORMAL HIGH (ref 20.0–28.0)
Calcium, Ion: 1.07 mmol/L — ABNORMAL LOW (ref 1.15–1.40)
HCT: 40 % (ref 36.0–46.0)
Hemoglobin: 13.6 g/dL (ref 12.0–15.0)
O2 Saturation: 70 %
Potassium: 4.1 mmol/L (ref 3.5–5.1)
Sodium: 140 mmol/L (ref 135–145)
TCO2: 37 mmol/L — ABNORMAL HIGH (ref 22–32)
pCO2, Ven: 59.9 mmHg (ref 44–60)
pH, Ven: 7.378 (ref 7.25–7.43)
pO2, Ven: 38 mmHg (ref 32–45)

## 2024-03-31 LAB — CBC WITH DIFFERENTIAL/PLATELET
Abs Immature Granulocytes: 0.09 K/uL — ABNORMAL HIGH (ref 0.00–0.07)
Basophils Absolute: 0 K/uL (ref 0.0–0.1)
Basophils Relative: 0 %
Eosinophils Absolute: 0.2 K/uL (ref 0.0–0.5)
Eosinophils Relative: 2 %
HCT: 40.7 % (ref 36.0–46.0)
Hemoglobin: 12.2 g/dL (ref 12.0–15.0)
Immature Granulocytes: 1 %
Lymphocytes Relative: 11 %
Lymphs Abs: 1.4 K/uL (ref 0.7–4.0)
MCH: 30.9 pg (ref 26.0–34.0)
MCHC: 30 g/dL (ref 30.0–36.0)
MCV: 103 fL — ABNORMAL HIGH (ref 80.0–100.0)
Monocytes Absolute: 0.7 K/uL (ref 0.1–1.0)
Monocytes Relative: 5 %
Neutro Abs: 10.3 K/uL — ABNORMAL HIGH (ref 1.7–7.7)
Neutrophils Relative %: 81 %
Platelets: 271 K/uL (ref 150–400)
RBC: 3.95 MIL/uL (ref 3.87–5.11)
RDW: 13.2 % (ref 11.5–15.5)
WBC: 12.7 K/uL — ABNORMAL HIGH (ref 4.0–10.5)
nRBC: 0 % (ref 0.0–0.2)

## 2024-03-31 LAB — COMPREHENSIVE METABOLIC PANEL WITH GFR
ALT: 27 U/L (ref 0–44)
AST: 40 U/L (ref 15–41)
Albumin: 4.3 g/dL (ref 3.5–5.0)
Alkaline Phosphatase: 64 U/L (ref 38–126)
Anion gap: 12 (ref 5–15)
BUN: 14 mg/dL (ref 8–23)
CO2: 32 mmol/L (ref 22–32)
Calcium: 8.9 mg/dL (ref 8.9–10.3)
Chloride: 97 mmol/L — ABNORMAL LOW (ref 98–111)
Creatinine, Ser: 0.89 mg/dL (ref 0.44–1.00)
GFR, Estimated: >60 mL/min
Glucose, Bld: 199 mg/dL — ABNORMAL HIGH (ref 70–99)
Potassium: 4.3 mmol/L (ref 3.5–5.1)
Sodium: 141 mmol/L (ref 135–145)
Total Bilirubin: 0.2 mg/dL (ref 0.0–1.2)
Total Protein: 7.3 g/dL (ref 6.5–8.1)

## 2024-03-31 LAB — PRO BRAIN NATRIURETIC PEPTIDE: Pro Brain Natriuretic Peptide: 183 pg/mL

## 2024-03-31 LAB — TROPONIN T, HIGH SENSITIVITY: Troponin T High Sensitivity: <15 ng/L (ref 0–19)

## 2024-03-31 MED ORDER — FUROSEMIDE 10 MG/ML IJ SOLN
20.0000 mg | Freq: Once | INTRAMUSCULAR | Status: AC
  Administered 2024-03-31: 20 mg via INTRAVENOUS
  Filled 2024-03-31: qty 2

## 2024-03-31 MED ORDER — DOXYCYCLINE HYCLATE 100 MG PO CAPS: 100.0000 mg | ORAL_CAPSULE | Freq: Two times a day (BID) | ORAL | 0 refills | Status: DC

## 2024-03-31 MED ORDER — DOXYCYCLINE HYCLATE 100 MG PO CAPS: 100.0000 mg | ORAL_CAPSULE | Freq: Two times a day (BID) | ORAL | 0 refills | Status: AC

## 2024-03-31 NOTE — Telephone Encounter (Signed)
 Per clinic note, spoke to pt, aware appt is needed.

## 2024-03-31 NOTE — ED Provider Notes (Signed)
 " [place] Provider Note   HPI/ROS    History obtained from patient.  Michele Owens is a 63 y.o. female who presents for Chest Pain and Shortness of Breath and who  has a past medical history of Anxiety, Arthritis, Asthma, Chronic diastolic CHF (congestive heart failure) (HCC), COPD (chronic obstructive pulmonary disease) (HCC), Depression, Diabetes mellitus without complication (HCC), GERD (gastroesophageal reflux disease), Gout, Headache, History of DVT of lower extremity, Hypertension, Morbid obesity with BMI of 45.0-49.9, adult (HCC), and Thyroid  cancer (HCC) (09/23/2016).  Patient brought in by EMS today for chest pain and shortness of breath.  When EMS got there she was found to be 83% on her 2 L nasal cannula and she was placed on 6 L with improvement in her oxygenation.  States she went to the her heart doctor today instead of her primary care doctor and thinks she might be volume overloaded because after her last admission they told her to stop taking her Lasix  pill because she had an AKI.  Endorses some streaks of blood in her sputum over the last month.  Denies any fevers, chills, nausea, vomiting, diarrhea.  MDM   I have reviewed the nursing documentation, vital signs, as well as the past medical history, surgical history, family history, and social history.  Initial Assessment:  Hemodynamically stable on home O2 on initial evaluation.  Concern for possible ACS versus heart failure exacerbation versus pneumonia.  Could also be bronchitis given patient having hemoptysis.  Low concern for PE or TB given patient has had recent negative CT PE just 9 days ago.  Not overtly tachypneic, tachycardic, or hypoxic here either.  Will evaluate with chest x-ray, basic labs, troponin and BNP, and EKG.  Chest x-ray here with possible right upper lobe pneumonia.  Very difficult to discern whether this is new or old given patient has had multiple recent  images and has scarring/atelectasis of her lungs from her chronic lung disease.  EKG here with sinus rhythm with nonspecific ST and T wave changes.  No obvious ischemia, dysrhythmia, or high-grade AV block.  CBC with mild leukocytosis with a left shift that is up from previous.  CMP with no significant electrolyte abnormality, AKI, or transaminitis.  An i-STAT VBG here with normal pH.  BNP and troponin within normal limits.  Lower concern for ACS as a cause of this chest pain.  Given patient has right upper lobe infiltrate on chest x-ray without other obvious abnormalities on labs or EKG feel patient stable for discharge.  Discussed with her that her creatinine is normalized and I gave her a dose of Lasix  here in the ED for ongoing diuresis.  Did tell her to follow-up with her PCP tomorrow and see if it is appropriate for her to resume her home diuretics now that her creatinine is stabilized.  Will also discharge patient home with doxycycline  for presumed pneumonia.  Did discuss that if she has any worsening shortness of breath or feels that her condition is getting worse at home that she needs to return to the emergency department.  She demonstrates understanding this plan is to return emergency department for any further concerns.  Disposition:  I discussed the plan for discharge with the patient and/or their surrogate at bedside prior to discharge and they were in agreement with the plan and verbalized understanding of the return precautions provided. All questions answered to the best of my ability. Ultimately, the patient was discharged in stable  condition with stable vital signs. I am reassured that they are capable of close follow up and good social support at home.    This patient was staffed with Dr. Bernard who supervised the visit and agreed with the plan of care.   Due to the patients current presenting symptoms, physical exam findings, and the workup stated above, it is thought that the  etiology of the patients current presentation is:  1. Pneumonia of right upper lobe due to infectious organism    Clinical Complexity A medically appropriate history, review of systems, and physical exam was performed.  Factors that affect the complexity of this encounter: assessment of correct protocol, laboratory work from this visit, notes from other physicians (prior admissions), and review of echocardiogram/EKG results  My independent interpretations of diagnostic studies are documented in the ED course above.   If decision rules were used in this patient's evaluation, they are listed below.   Click here for ABCD2, HEART and other calculators  Patient's presentation is most consistent with acute complicated illness / injury requiring diagnostic workup.  MDM generated using voice dictation software and may contain dictation errors. Please contact me for any clarification or with any questions.    Physical Exam, PMH, PSH, Family History, and Social Hsitory   Vitals:   03/31/24 1956 03/31/24 2003  BP:  121/70  Pulse:  75  Resp:  (!) 21  SpO2: 95% 96%    Physical Exam Constitutional:      Appearance: She is obese.  HENT:     Head: Normocephalic and atraumatic.  Cardiovascular:     Rate and Rhythm: Normal rate and regular rhythm.  Pulmonary:     Effort: Pulmonary effort is normal.     Breath sounds: No wheezing or rhonchi.  Chest:     Chest wall: No tenderness.  Abdominal:     Tenderness: There is no abdominal tenderness. There is no guarding or rebound.  Musculoskeletal:     Right lower leg: Edema present.     Left lower leg: Edema present.  Skin:    General: Skin is warm and dry.     Capillary Refill: Capillary refill takes less than 2 seconds.  Neurological:     General: No focal deficit present.     Mental Status: She is alert and oriented to person, place, and time.     Past Medical History:  Diagnosis Date   Anxiety    Arthritis    Asthma    Chronic  diastolic CHF (congestive heart failure) (HCC)    COPD (chronic obstructive pulmonary disease) (HCC)    Uses Oxygen  at night   Depression    Diabetes mellitus without complication (HCC)    GERD (gastroesophageal reflux disease)    Gout    Headache    migraines   History of DVT of lower extremity    Hypertension    Morbid obesity with BMI of 45.0-49.9, adult (HCC)    Thyroid  cancer (HCC) 09/23/2016     Past Surgical History:  Procedure Laterality Date   BUNIONECTOMY Bilateral    CARDIAC CATHETERIZATION N/A 06/23/2015   Procedure: Right/Left Heart Cath and Coronary Angiography;  Surgeon: Ezra GORMAN Shuck, MD;  Location: [facility] INVASIVE CV LAB;  Service: Cardiovascular;  Laterality: N/A;   COLONOSCOPY WITH PROPOFOL  N/A 12/15/2015   Procedure: COLONOSCOPY WITH PROPOFOL ;  Surgeon: Norleen Hint, MD;  Location: [facility] ENDOSCOPY;  Service: Endoscopy;  Laterality: N/A;   RIGHT HEART CATH N/A 10/01/2019   Procedure: RIGHT HEART  CATH;  Surgeon: Rolan Ezra RAMAN, MD;  Location: [facility] INVASIVE CV LAB;  Service: Cardiovascular;  Laterality: N/A;   RIGHT HEART CATH N/A 03/03/2024   Procedure: RIGHT HEART CATH;  Surgeon: Rolan Ezra RAMAN, MD;  Location: [facility] INVASIVE CV LAB;  Service: Cardiovascular;  Laterality: N/A;   THYROIDECTOMY  09/19/2016   THYROIDECTOMY N/A 09/19/2016   Procedure: TOTAL THYROIDECTOMY;  Surgeon: Eletha Boas, MD;  Location: [facility] OR;  Service: General;  Laterality: N/A;   TONSILLECTOMY     TOTAL ABDOMINAL HYSTERECTOMY  07/14/10     Family History  Problem Relation Age of Onset   Cancer Father        thought to be due to exposure to concrete   Diabetes Mother    Thyroid  cancer Mother        dx in her 100s-60s   Cancer Maternal Uncle        2 uncles with cancer NOS   Brain cancer Paternal Aunt    Cancer Cousin        maternal first cousin - NOS   Cancer Cousin        maternal first cousin - NOS    Social History   Tobacco Use   Smoking status: Former    Current packs/day: 0.00     Average packs/day: 0.5 packs/day for 41.0 years (20.5 ttl pk-yrs)    Types: Cigarettes    Start date: 07/1975    Quit date: 07/2016    Years since quitting: 7.7   Smokeless tobacco: Never   Tobacco comments:    Pt stop smoking 07/04/2011  Substance Use Topics   Alcohol use: Not Currently    Comment: quit 12 years ago     Procedures   If procedures were preformed on this patient, they are listed below:  Procedures   Electronically signed by:   Michele Owens, M.D. PGY-2, Emergency Medicine   Please note that this documentation was produced with the assistance of voice-to-text technology and may contain errors.    Owens Gl[place]ia, MD 04/01/24 Michele Owens    Michele Drivers, MD 04/05/24 [phone]  "

## 2024-03-31 NOTE — Telephone Encounter (Signed)
 LVM for pt to call back regarding the CRM below.

## 2024-03-31 NOTE — Discharge Instructions (Addendum)
 You were seen today for shortness of breath. While you were here we monitored your vitals, preformed a physical exam, and labs and x-rays. These were all reassuring and there is no indication for any further testing or intervention in the emergency department at this time.   Things to do:  - Follow up with your primary care provider within the next 1-2 weeks - Please call your PCP tomorrow to see if they would like you to resume your diuretics as your creatinine is stabilized - We have sent you an antibiotic for your pneumonia, please pick it up and take it as prescribed  Return to the emergency department if you have any new or worsening symptoms including chest pain, worsening shortness of breath, worsening hemoptysis, or if you have any other concerns.

## 2024-03-31 NOTE — Telephone Encounter (Signed)
 Copied from CRM [id number]. Topic: Appointments - Appointment Scheduling >> Mar 31, 2024  2:41 PM Michele Owens wrote: Patient/patient representative is calling to schedule an appointment. Refer to attachments for appointment information. Patient was not aware she had an appointment today, but states she does not want to see any other provider she wants to see Michele Owens. Patient was in the ED on 03/23/2024.  I called pt back and told her of Dr Oswald absence and replied well I guess I better come on in. When I informed her she had the appointment today 12/22 at 2:40 and it is now 3:20 she became frustrated, put the phone down and walked away. I waited about 10 minutes then hung up.

## 2024-03-31 NOTE — ED Triage Notes (Addendum)
 BIBEMS for CP and SOB. Pt starting having intermittent L CP walking to grocery store. Pt states that pain is worse upon palpation. Pt found to be at 83% on baseline 2L [place]. Placed on 6L [place]. Hx COPD. Pt received 324 ASA and 0.4mg  SL nitroglycerin . Pt states she is coughing up blood into the mucous

## 2024-03-31 NOTE — Telephone Encounter (Signed)
 This RN made first attempt to contact pt with no answer. A voicemail was left with call back number provided.      Message from Chester C sent at 03/31/2024  1:42 PM EST  Summary: Question of water  pills   Reason for Triage: Spitting up phlegm, looks bloody patient was in the hospital. Patient is still dealing with her symptoms of pneumonia and she is still taking her pills for it. Tomorrow is her last day for pills. Had a hospital follow up yesterday. Patient has question about going back on water  pills.  She is waiting to go to her appointment to the heart doctor in this afternoon and was looking.    [phone] (M)

## 2024-04-01 ENCOUNTER — Encounter: Payer: Self-pay | Admitting: Family Medicine

## 2024-04-01 ENCOUNTER — Telehealth: Payer: Self-pay

## 2024-04-01 DIAGNOSIS — G43E09 Chronic migraine with aura, not intractable, without status migrainosus: Secondary | ICD-10-CM

## 2024-04-01 MED ORDER — RIZATRIPTAN BENZOATE 10 MG PO TABS: 10.0000 mg | ORAL_TABLET | ORAL | 0 refills | Status: DC | PRN

## 2024-04-01 NOTE — Telephone Encounter (Signed)
 ATC Patient back to discuss medication, [email or web address]

## 2024-04-01 NOTE — Telephone Encounter (Signed)
 Patient called in to find out wether or not she should be taking her fluid pills. She is stating that the hospital told her she should be taking the medication but she wants approval from her PCP. Explained to patient her PCP is [facility]OO, and I will forward this message to the doc of day.

## 2024-04-01 NOTE — Telephone Encounter (Signed)
 ATC patient to get her scheduled for Hospital f/u. If patient call back please sch her for the first avail hospital f/u appt.

## 2024-04-01 NOTE — Telephone Encounter (Signed)
 2nd missed visit (01/28/2024 and 03/31/2024). Final warning sent via mail. Pt does not have MyChart.

## 2024-04-01 NOTE — Telephone Encounter (Signed)
 Contacted patient and provided patient with message received. Patient expressed understanding. Nothing further needed.

## 2024-04-01 NOTE — Telephone Encounter (Signed)
 Fax received from [facility] requesting supporting documentation for oxygen  concentrator and portable oxygen  system. 12/25/23 OV notes and 01/02/24 NV nottes (ambulation test) faxed to [facility] at [phone]

## 2024-04-01 NOTE — Telephone Encounter (Signed)
 Copied from CRM [id number]. Topic: Clinical - Medication Question >> Apr 01, 2024 10:10 AM [name] wrote: Reason for CRM: Patient called in stated she needs her migraine medication refilled, stated she didn't know the name

## 2024-04-01 NOTE — Telephone Encounter (Signed)
 This encounter was created in error - please disregard.

## 2024-04-01 NOTE — Telephone Encounter (Signed)
 ER note and lab work reviewed. She can resume the fluid pills , but I do advise her to follow up with Dr. Sebastian when he returns.

## 2024-04-01 NOTE — Telephone Encounter (Signed)
 Patient calling in to request a refill on her migraine meds. Medication was prescribed to her at the hospital not from PCP

## 2024-04-01 NOTE — Telephone Encounter (Signed)
 Copied from CRM [id number]. Topic: General - Other >> Apr 01, 2024  9:04 AM Aleatha C wrote: Reason for CRM: Patient wants to know if she can start back on fluid pills she has fluid in her lungs, and she would like a call to further discuss

## 2024-04-01 NOTE — ED Provider Notes (Incomplete)
 " [name] [facility] Provider Note   HPI/ROS    History obtained from patient.  Michele Owens is a 63 y.o. female who presents for Chest Pain and Shortness of Breath and who  has a past medical history of Anxiety, Arthritis, Asthma, Chronic diastolic CHF (congestive heart failure) (HCC), COPD (chronic obstructive pulmonary disease) (HCC), Depression, Diabetes mellitus without complication (HCC), GERD (gastroesophageal reflux disease), Gout, Headache, History of DVT of lower extremity, Hypertension, Morbid obesity with BMI of 45.0-49.9, adult (HCC), and Thyroid  cancer (HCC) (09/23/2016).  Patient brought in by EMS today for chest pain and shortness of breath.  When EMS got there she was found to be 83% on her 2 L nasal cannula and she was placed on 6 L with improvement in her oxygenation.  States she went to the her heart doctor today instead of her primary care doctor and thinks she might be volume overloaded because after her last admission they told her to stop taking her Lasix  pill because she had an AKI.  Endorses some streaks of blood in her sputum over the last month.  Denies any fevers, chills, nausea, vomiting, diarrhea.  MDM   I have reviewed the nursing documentation, vital signs, as well as the past medical history, surgical history, family history, and social history.  Initial Assessment:  Hemodynamically stable on home O2 on initial evaluation.  Concern for possible ACS versus heart failure exacerbation versus pneumonia.  Could also be bronchitis given patient having hemoptysis.  Low concern for PE or TB given patient has had recent negative CT PE just 9 days ago.  Not overtly tachypneic, tachycardic, or hypoxic here either.  Will evaluate with chest x-ray, basic labs, troponin and BNP, and EKG.    Disposition:  {ED Dispo:29898}    This patient was staffed with Dr. Bernard who supervised the visit and agreed with the plan of care.   Due  to the patients current presenting symptoms, physical exam findings, and the workup stated above, it is thought that the etiology of the patients current presentation is:  1. Pneumonia of right upper lobe due to infectious organism    Clinical Complexity A medically appropriate history, review of systems, and physical exam was performed.  Factors that affect the complexity of this encounter: assessment of correct protocol, laboratory work from this visit, notes from other physicians (prior admissions), and review of echocardiogram/EKG results  My independent interpretations of diagnostic studies are documented in the ED course above.   If decision rules were used in this patient's evaluation, they are listed below.   Click here for ABCD2, HEART and other calculators  Patient's presentation is most consistent with acute complicated illness / injury requiring diagnostic workup.  MDM generated using voice dictation software and may contain dictation errors. Please contact me for any clarification or with any questions.    Physical Exam, PMH, PSH, Family History, and Social Hsitory   Vitals:   03/31/24 1956 03/31/24 2003  BP:  121/70  Pulse:  75  Resp:  (!) 21  SpO2: 95% 96%    Physical Exam Constitutional:      Appearance: She is obese.  HENT:     Head: Normocephalic and atraumatic.  Cardiovascular:     Rate and Rhythm: Normal rate and regular rhythm.  Pulmonary:     Effort: Pulmonary effort is normal.     Breath sounds: No wheezing or rhonchi.  Chest:     Chest wall: No tenderness.  Abdominal:     Tenderness: There is no abdominal tenderness. There is no guarding or rebound.  Musculoskeletal:     Right lower leg: Edema present.     Left lower leg: Edema present.  Skin:    General: Skin is warm and dry.     Capillary Refill: Capillary refill takes less than 2 seconds.  Neurological:     General: No focal deficit present.     Mental Status: She is alert and oriented to  person, place, and time.     Past Medical History:  Diagnosis Date   Anxiety    Arthritis    Asthma    Chronic diastolic CHF (congestive heart failure) (HCC)    COPD (chronic obstructive pulmonary disease) (HCC)    Uses Oxygen  at night   Depression    Diabetes mellitus without complication (HCC)    GERD (gastroesophageal reflux disease)    Gout    Headache    migraines   History of DVT of lower extremity    Hypertension    Morbid obesity with BMI of 45.0-49.9, adult (HCC)    Thyroid  cancer (HCC) 09/23/2016     Past Surgical History:  Procedure Laterality Date   BUNIONECTOMY Bilateral    CARDIAC CATHETERIZATION N/A 06/23/2015   Procedure: Right/Left Heart Cath and Coronary Angiography;  Surgeon: Ezra GORMAN Shuck, MD;  Location: [facility] INVASIVE CV LAB;  Service: Cardiovascular;  Laterality: N/A;   COLONOSCOPY WITH PROPOFOL  N/A 12/15/2015   Procedure: COLONOSCOPY WITH PROPOFOL ;  Surgeon: Norleen Hint, MD;  Location: [facility] ENDOSCOPY;  Service: Endoscopy;  Laterality: N/A;   RIGHT HEART CATH N/A 10/01/2019   Procedure: RIGHT HEART CATH;  Surgeon: Shuck Ezra GORMAN, MD;  Location: [facility] INVASIVE CV LAB;  Service: Cardiovascular;  Laterality: N/A;   RIGHT HEART CATH N/A 03/03/2024   Procedure: RIGHT HEART CATH;  Surgeon: Shuck Ezra GORMAN, MD;  Location: [facility] INVASIVE CV LAB;  Service: Cardiovascular;  Laterality: N/A;   THYROIDECTOMY  09/19/2016   THYROIDECTOMY N/A 09/19/2016   Procedure: TOTAL THYROIDECTOMY;  Surgeon: Eletha Boas, MD;  Location: [facility] OR;  Service: General;  Laterality: N/A;   TONSILLECTOMY     TOTAL ABDOMINAL HYSTERECTOMY  07/14/10     Family History  Problem Relation Age of Onset   Cancer Father        thought to be due to exposure to concrete   Diabetes Mother    Thyroid  cancer Mother        dx in her 35s-60s   Cancer Maternal Uncle        2 uncles with cancer NOS   Brain cancer Paternal Aunt    Cancer Cousin        maternal first cousin - NOS    Cancer Cousin        maternal first cousin - NOS    Social History   Tobacco Use   Smoking status: Former    Current packs/day: 0.00    Average packs/day: 0.5 packs/day for 41.0 years (20.5 ttl pk-yrs)    Types: Cigarettes    Start date: 07/1975    Quit date: 07/2016    Years since quitting: 7.7   Smokeless tobacco: Never   Tobacco comments:    Pt stop smoking 07/04/2011  Substance Use Topics   Alcohol use: Not Currently    Comment: quit 12 years ago     Procedures   If procedures were preformed on this patient, they are listed below:  Procedures   Electronically  signed by:   Glendia Carlin Ancona, M.D. PGY-2, Emergency Medicine   Please note that this documentation was produced with the assistance of voice-to-text technology and may contain errors.  "

## 2024-04-15 ENCOUNTER — Encounter: Payer: Self-pay | Admitting: Family Medicine

## 2024-04-15 ENCOUNTER — Inpatient Hospital Stay: Admitting: Internal Medicine

## 2024-04-15 ENCOUNTER — Ambulatory Visit: Admitting: Family Medicine

## 2024-04-15 VITALS — BP 134/68 | HR 78 | Temp 97.7°F | Ht 65.0 in | Wt 333.4 lb

## 2024-04-15 DIAGNOSIS — G894 Chronic pain syndrome: Secondary | ICD-10-CM | POA: Diagnosis not present

## 2024-04-15 DIAGNOSIS — G43E09 Chronic migraine with aura, not intractable, without status migrainosus: Secondary | ICD-10-CM | POA: Diagnosis not present

## 2024-04-15 DIAGNOSIS — J189 Pneumonia, unspecified organism: Secondary | ICD-10-CM

## 2024-04-15 DIAGNOSIS — I200 Unstable angina: Secondary | ICD-10-CM

## 2024-04-15 MED ORDER — NITROGLYCERIN 0.4 MG SL SUBL: 0.4000 mg | SUBLINGUAL_TABLET | SUBLINGUAL | 6 refills | Status: AC | PRN

## 2024-04-15 MED ORDER — GABAPENTIN 100 MG PO CAPS: 100.0000 mg | ORAL_CAPSULE | Freq: Two times a day (BID) | ORAL | 0 refills | Status: AC

## 2024-04-15 MED ORDER — BUTALBITAL-APAP-CAFFEINE 50-325-40 MG PO TABS: 1.0000 | ORAL_TABLET | Freq: Four times a day (QID) | ORAL | 0 refills | Status: AC | PRN

## 2024-04-15 MED ORDER — DICYCLOMINE HCL 20 MG PO TABS: 20.0000 mg | ORAL_TABLET | Freq: Two times a day (BID) | ORAL | 0 refills | Status: AC

## 2024-04-15 NOTE — Assessment & Plan Note (Signed)
 I will renew her gabapentin . She should follow-up with her providers that are managing her chronic pain.

## 2024-04-15 NOTE — Progress Notes (Signed)
 " [facility] [address] [place] [address] Dept: [phone] Dept Fax: [phone]  Hospital Follow-up Visit  Subjective:    Patient ID: Michele Owens, female    DOB: 1960/05/14, 64 y.o..   MRN: [id number]  Chief Complaint  Patient presents with   Hospitalization Follow-up    Hospital fu from 03/27/24 for Pneumonia.    History of Present Illness:  Patient is in today for follow-up form a recent hospitalization an ED visit. Ms. Michele Owens was admitted at [facility] from 12/14-12/16/2025 with a presumed pneumonia. She had been experiencing 2 days of chest discomfort, a productive cough, and generalized weakness. Her chest x-ray had demonstrated patchy infiltrates in the RUL. She was treated with antibiotics. As she has a history of COPD and obesity hypoventilation syndrome, she is already on chronic O2 at home. She additionally has a history of anxiety, asthma, CHF, and morbid obesity.  Ms. Michele Owens was seen at  [facility] on 03/31/2024 via ambulance complaining of chest pain and shortness of breath. On presentation, her O2 was 83% on 2 L nasal cannula, so she was placed on 6 L with improvement. She was noted to have been told to stop her furosemide  by her cardiologist due to her AKI, and believed she was volume overloaded. Chest x-ray showed possible right upper lobe pneumonia, unclear whether old or new due to scarring/atelectasis related to her pulmonary conditions. Cardiac workup was benign (BNP and troponin within normal limits). Her CBC showed mild leukocytosis with a left shift that was up from previous. She was discharged with doxycycline  100 mg bid for 7 days for presumed persistent pneumonia and instructed to follow up about restarting diuretics  [name] notes that her cardiologist had stopped her furosemide  and had her on torsemide  20 mg three tablets twice a day. At some point, she was also provided metolazone , which she did not  feel was effective. She continues to feel like she has too much fluid in her system. It appears that this was prior to her hospitalization.  Ms. Michele Owens expressed anger and frustration over her health status and what she views as the unwillingness of anyone to help her. She raised multiple chronic medical issues to me that she is seeking answers to.  Past Medical History: Patient Active Problem List   Diagnosis Date Noted   Generalized weakness 03/24/2024   Community acquired pneumonia 03/23/2024   Primary osteoarthritis of both knees 12/25/2023   Unstable angina (HCC) 12/25/2023   Limited mobility 12/25/2023   Chronic migraine with aura without status migrainosus, not intractable 12/25/2023   Insomnia 12/25/2023   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) 12/07/2023   Mild episode of recurrent major depressive disorder 12/05/2023   GAD (generalized anxiety disorder) 12/05/2023   Former smoker 12/05/2023   Hypokalemia 11/13/2023   Vitamin D  deficiency 11/13/2023   Chronic idiopathic constipation 11/13/2023   Cellulitis 09/09/2023   UTI (urinary tract infection) 09/09/2023   History of COPD 06/06/2023   Bipolar disorder (HCC) 06/06/2023   At risk for polypharmacy 02/07/2023   Dependence on continuous supplemental oxygen  02/07/2023   Acute respiratory failure with hypercapnia (HCC) 12/14/2022   Delirium due to multiple etiologies, acute, hyperactive 12/14/2022   Acute respiratory failure with hypoxia and hypercapnia (HCC) 10/25/2022   Acute on chronic diastolic CHF (congestive heart failure) (HCC) 10/23/2022   Current mild episode of major depressive disorder without prior episode 06/06/2022   Fever 06/04/2022   Acute on chronic  diastolic (congestive) heart failure (HCC) 05/16/2022   Acute kidney injury superimposed on chronic kidney disease 02/01/2022   Hypothyroidism 02/01/2022   Chronic pain disorder 02/01/2022   Respiratory failure with hypercapnia (HCC) 12/23/2020    Anemia 12/23/2020   Diabetic peripheral neuropathy (HCC) 08/18/2020   Acute on chronic respiratory failure with hypoxia (HCC) 04/18/2020   Hemoptysis 04/18/2020   Encephalopathy 04/18/2020   Chronic diastolic CHF (congestive heart failure) (HCC) 04/18/2020   Sepsis (HCC) 04/18/2020   Morbid obesity with BMI of 50.0-59.9, adult (HCC)    Fall at home, initial encounter 06/23/2018   HNP (herniated nucleus pulposus), thoracic 06/23/2018   Essential hypertension 08/28/2017   Obesity, class 3 (HCC) 08/28/2017   COPD exacerbation (HCC) 08/04/2017   Acute and chronic respiratory failure with hypercapnia (HCC)    Pulmonary HTN (HCC)    Encounter for geriatric assessment 02/27/2017   Family history of thyroid  cancer    Hypomagnesemia 10/03/2016   Atypical chest pain    HLD (hyperlipidemia) 09/23/2016   COPD (chronic obstructive pulmonary disease) (HCC) 09/23/2016   Gout 09/23/2016   DVT (deep vein thrombosis) in pregnancy 09/23/2016   Thyroid  cancer (HCC) 09/23/2016   Hypocalcemia 09/23/2016   AKI (acute kidney injury) 09/23/2016   Acute pulmonary edema (HCC)    Acute hypoxemic respiratory failure (HCC)    Chronic hypoxic respiratory failure (HCC) 07/16/2016   CAP (community acquired pneumonia) 01/23/2016   Multinodular goiter w/ dominant right thyroid  nodule 01/23/2016   Pulmonary hypertension (HCC) 01/23/2016   Obesity hypoventilation syndrome (HCC) 08/26/2015   Chest pain 06/16/2015   Non-insulin  dependent type 2 diabetes mellitus (HCC) 04/24/2015   Leukocytosis 04/24/2015   Type 2 diabetes mellitus with hyperlipidemia (HCC) 04/24/2015   Depression with anxiety 04/24/2015   Benign essential HTN 04/24/2015   Normocytic anemia 04/24/2015   Anxiety 04/24/2015   OSA (obstructive sleep apnea) 05/10/2012   Tobacco abuse 07/04/2011   Past Surgical History:  Procedure Laterality Date   BUNIONECTOMY Bilateral    CARDIAC CATHETERIZATION N/A 06/23/2015   Procedure: Right/Left Heart Cath  and Coronary Angiography;  Surgeon: Ezra GORMAN Shuck, MD;  Location: [facility] INVASIVE CV LAB;  Service: Cardiovascular;  Laterality: N/A;   COLONOSCOPY WITH PROPOFOL  N/A 12/15/2015   Procedure: COLONOSCOPY WITH PROPOFOL ;  Surgeon: Norleen Hint, MD;  Location: [facility] ENDOSCOPY;  Service: Endoscopy;  Laterality: N/A;   RIGHT HEART CATH N/A 10/01/2019   Procedure: RIGHT HEART CATH;  Surgeon: Shuck Ezra GORMAN, MD;  Location: [facility] INVASIVE CV LAB;  Service: Cardiovascular;  Laterality: N/A;   RIGHT HEART CATH N/A 03/03/2024   Procedure: RIGHT HEART CATH;  Surgeon: Shuck Ezra GORMAN, MD;  Location: [facility] INVASIVE CV LAB;  Service: Cardiovascular;  Laterality: N/A;   THYROIDECTOMY  09/19/2016   THYROIDECTOMY N/A 09/19/2016   Procedure: TOTAL THYROIDECTOMY;  Surgeon: Eletha Boas, MD;  Location: [facility] OR;  Service: General;  Laterality: N/A;   TONSILLECTOMY     TOTAL ABDOMINAL HYSTERECTOMY  07/14/10   Family History  Problem Relation Age of Onset   Cancer Father        thought to be due to exposure to concrete   Diabetes Mother    Thyroid  cancer Mother        dx in her 68s-60s   Cancer Maternal Uncle        2 uncles with cancer NOS   Brain cancer Paternal Aunt    Cancer Cousin        maternal first cousin - NOS   Cancer  Cousin        maternal first cousin - NOS   Outpatient Medications Prior to Visit  Medication Sig Dispense Refill   ACCU-CHEK GUIDE TEST test strip USE TO CHECK BLOOD SUGAR 3 TIMES DAILY 100 each 12   Accu-Chek Softclix Lancets lancets Use to check blood sugar 3 times daily 100 each 12   acetaminophen  (TYLENOL ) 325 MG tablet Take 2 tablets (650 mg total) by mouth every 6 (six) hours as needed for mild pain (pain score 1-3) or fever (or Fever >/= 101).     albuterol  (PROVENTIL ) (2.5 MG/3ML) 0.083% nebulizer solution Take 3 mLs (2.5 mg total) by nebulization every 6 (six) hours as needed for wheezing or shortness of breath. 75 mL 12   albuterol  (VENTOLIN  HFA) 108 (90 Base) MCG/ACT inhaler Inhale 1-2  puffs into the lungs every 6 (six) hours as needed for wheezing or shortness of breath. 1 each 1   allopurinol  (ZYLOPRIM ) 100 MG tablet Take 1 tablet (100 mg total) by mouth daily. 90 tablet 3   aspirin  EC 81 MG tablet Take 1 tablet (81 mg total) by mouth daily. Swallow whole. 90 tablet 3   blood glucose meter kit and supplies KIT Dispense based on patient and insurance preference. Use up to four times daily as directed. 1 each 0   Blood Glucose Monitoring Suppl DEVI 1 each by Does not apply route in the morning, at noon, and at bedtime. May substitute to any manufacturer covered by patient's insurance. 1 each 0   budesonide  (PULMICORT ) 0.25 MG/2ML nebulizer solution Inhale 2 mLs (0.25 mg total) by nebulization 2 (two) times daily. 120 mL 0   busPIRone  (BUSPAR ) 30 MG tablet Take 1 tablet (30 mg total) by mouth 2 (two) times daily. 180 tablet 3   carvedilol  (COREG ) 6.25 MG tablet Take 1 tablet (6.25 mg total) by mouth 2 (two) times daily. 60 tablet 11   cefdinir  (OMNICEF ) 300 MG capsule Take 1 capsule (300 mg total) by mouth 2 (two) times daily. 10 capsule 0   clotrimazole  (LOTRIMIN ) 1 % cream Apply topically 2 (two) times daily. 30 g 0   diclofenac  Sodium (VOLTAREN ) 1 % GEL Apply 4 g topically 4 (four) times daily. 120 g 0   dicyclomine  (BENTYL ) 20 MG tablet Take 1 tablet (20 mg total) by mouth 2 (two) times daily. 20 tablet 0   EPINEPHrine  0.3 mg/0.3 mL IJ SOAJ injection Inject 0.3 mg into the muscle once as needed for anaphylaxis.     fenofibrate  (TRICOR ) 145 MG tablet Take 1 tablet (145 mg total) by mouth daily. 30 tablet 11   FEROSUL 325 (65 Fe) MG tablet Take 1 tablet (325 mg total) by mouth daily. 30 tablet 11   gabapentin  (NEURONTIN ) 100 MG capsule 100 mg 2 (two) times daily.     HYDROcodone -acetaminophen  (NORCO/VICODIN) 5-325 MG tablet Take 1 tablet by mouth every 4 (four) hours as needed for severe pain (pain score 7-10). 14 tablet 0   ipratropium-albuterol  (DUONEB) 0.5-2.5 (3) MG/3ML SOLN  Take 3 mLs by nebulization every 4 (four) hours as needed. 360 mL 0   levocetirizine (XYZAL ) 5 MG tablet TAKE 1 TABLET (5 MG TOTAL) BY MOUTH AT BEDTIME. 30 tablet 0   levothyroxine  (SYNTHROID ) 125 MCG tablet TAKE 1 TABLET (125 MCG TOTAL) BY MOUTH AT BEDTIME. 90 tablet 0   lidocaine  (HM LIDOCAINE  PATCH) 4 % Place 1 patch onto the skin daily. (Patient not taking: Reported on 03/23/2024) 20 patch 0   losartan  (COZAAR )  25 MG tablet Take 1 tablet (25 mg total) by mouth daily. 30 tablet 11   lurasidone  (LATUDA ) 20 MG TABS tablet Take 1 tablet (20 mg total) by mouth at bedtime. Take with a snack. This is for mood and sleep. 90 tablet 3   metolazone  (ZAROXOLYN ) 2.5 MG tablet Take 1 tablet (2.5 mg total) by mouth as directed. TAKE ONE DOSE OF METOLAZONE  2.5MG  TODAY, AND TOMORROW WITH (2) TABLETS OF POTASSIUM WITH THIS (Patient not taking: Reported on 03/23/2024) 2 tablet 0   nitroGLYCERIN  (NITROSTAT ) 0.4 MG SL tablet Place 1 tablet (0.4 mg total) under the tongue every 5 (five) minutes as needed for chest pain. 12 tablet 6   omeprazole  (PRILOSEC) 20 MG capsule TAKE 2 CAPSULES (40 MG TOTAL) BY MOUTH DAILY (2AM) 180 capsule 1   ondansetron  (ZOFRAN -ODT) 4 MG disintegrating tablet Take 1 tablet (4 mg total) by mouth every 8 (eight) hours as needed for nausea or vomiting. 20 tablet 0   OXYGEN  Inhale 2 L into the lungs continuous.     potassium chloride  (KLOR-CON  M) 10 MEQ tablet Take 2 tablets (20 mEq total) by mouth daily. 60 tablet 3   Rimegepant Sulfate (NURTEC) 75 MG TBDP Take 1 tablet (75 mg total) by mouth every other day. Prevents and Treats Migraine Headaches (Patient not taking: Reported on 03/23/2024) 45 tablet 3   rizatriptan  (MAXALT ) 10 MG tablet Take 1 tablet (10 mg total) by mouth as needed for migraine. May repeat in 2 hours if needed 10 tablet 0   rosuvastatin  (CRESTOR ) 10 MG tablet Take 1 tablet (10 mg total) by mouth every evening. 30 tablet 11   spironolactone  (ALDACTONE ) 25 MG tablet Take  0.5 tablets (12.5 mg total) by mouth daily. 15 tablet 0   torsemide  (DEMADEX ) 20 MG tablet Take 4 tablets (80 mg total) by mouth 2 (two) times daily. 240 tablet 5   umeclidinium-vilanterol (ANORO ELLIPTA ) 62.5-25 MCG/ACT AEPB Inhale 1 puff into the lungs daily. 98 each 0   Vitamin D , Ergocalciferol , (DRISDOL ) 1.25 MG (50000 UNIT) CAPS capsule Take 1 capsule (50,000 Units total) by mouth every 7 (seven) days. 12 capsule 0   No facility-administered medications prior to visit.   Allergies[1]   Objective:   Today's Vitals   04/15/24 1524  BP: 134/68  Pulse: 78  Temp: 97.7 F (36.5 C)  TempSrc: Temporal  SpO2: 94%  Weight: (!) 333 lb 6.4 oz (151.2 kg)  Height: 5' 5 (1.651 m)   Body mass index is 55.48 kg/m.   General: Well developed, well nourished. No acute distress. Lungs: Breath sounds are very quite. Clear to auscultation bilaterally. No wheezing, rales or rhonchi. CV: RRR without murmurs or rubs. Pulses 2+ bilaterally. Extremities:Trace-1+ edema noted. Psych: Alert and oriented. Normal mood and affect.  Health Maintenance Due  Topic Date Due   OPHTHALMOLOGY EXAM  Never done   FOOT EXAM  08/07/2019   COVID-19 Vaccine (3 - Pfizer risk series) 09/29/2019   Mammogram  03/18/2021   Zoster Vaccines- Shingrix  (2 of 2) 01/30/2024     Assessment & Plan:   Problem List Items Addressed This Visit       Cardiovascular and Mediastinum   Chronic migraine with aura without status migrainosus, not intractable   I will renew her Fioricet , though I have concerns for her using this as her sole medication for migraine management. She should reassess this with Dr. Sebastian.      Relevant Medications   gabapentin  (NEURONTIN ) 100 MG capsule  nitroGLYCERIN  (NITROSTAT ) 0.4 MG SL tablet   butalbital -acetaminophen -caffeine  (FIORICET ) 50-325-40 MG tablet   Unstable angina (HCC)   I will refill her NTG for PRN use.      Relevant Medications   nitroGLYCERIN  (NITROSTAT ) 0.4 MG SL tablet      Respiratory   CAP (community acquired pneumonia) - Primary   The recent respiratory issues are consistent with pneumonia as the etiology. Her O2 sat on 2 lpm of O2 is stable. She is no longer showing coughing or dyspnea. It appears this has resolved.        Other   Chronic pain disorder (Chronic)   I will renew her gabapentin . She should follow-up with her providers that are managing her chronic pain.      Relevant Medications   gabapentin  (NEURONTIN ) 100 MG capsule   butalbital -acetaminophen -caffeine  (FIORICET ) 50-325-40 MG tablet    Return in about 8 days (around 04/23/2024) for Reassessment with PCP.    Garnette CHRISTELLA Simpler, MD  I,Emily Lagle,acting as a scribe for Garnette CHRISTELLA Simpler, MD.,have documented all relevant documentation on the behalf of Garnette CHRISTELLA Simpler, MD.  I, Garnette CHRISTELLA Simpler, MD, have reviewed all documentation for this visit. The documentation on 04/15/2024 for the exam, diagnosis, procedures, and orders are all accurate and complete.     [1]  Allergies Allergen Reactions   Bee Venom Anaphylaxis    All over my body (swelling)   Fioricet  [Butalbital -Apap-Caffeine ] Nausea And Vomiting and Rash   Ibuprofen  Rash and Other (See Comments)    Severe rash   Lamisil  [Terbinafine ] Rash and Other (See Comments)    Pt states this causes her to feel funny   Nsaids Other (See Comments)    Per MD's orders    "

## 2024-04-15 NOTE — Assessment & Plan Note (Signed)
 I will renew her Fioricet , though I have concerns for her using this as her sole medication for migraine management. She should reassess this with Dr. Sebastian.

## 2024-04-15 NOTE — Assessment & Plan Note (Signed)
 I will refill her NTG for PRN use.

## 2024-04-15 NOTE — Assessment & Plan Note (Signed)
 The recent respiratory issues are consistent with pneumonia as the etiology. Her O2 sat on 2 lpm of O2 is stable. She is no longer showing coughing or dyspnea. It appears this has resolved.

## 2024-04-16 ENCOUNTER — Other Ambulatory Visit: Payer: Self-pay | Admitting: Family Medicine

## 2024-04-18 ENCOUNTER — Other Ambulatory Visit: Payer: Self-pay | Admitting: Family Medicine

## 2024-04-18 DIAGNOSIS — G4709 Other insomnia: Secondary | ICD-10-CM

## 2024-04-20 ENCOUNTER — Emergency Department (HOSPITAL_COMMUNITY)

## 2024-04-20 ENCOUNTER — Other Ambulatory Visit: Payer: Self-pay

## 2024-04-20 ENCOUNTER — Encounter (HOSPITAL_COMMUNITY): Payer: Self-pay | Admitting: Internal Medicine

## 2024-04-20 ENCOUNTER — Inpatient Hospital Stay (HOSPITAL_COMMUNITY)
Admission: EM | Admit: 2024-04-20 | Discharge: 2024-04-27 | DRG: 871 | Disposition: A | Discharge status: Home / Self Care | Attending: Internal Medicine | Admitting: Internal Medicine

## 2024-04-20 DIAGNOSIS — I110 Hypertensive heart disease with heart failure: Secondary | ICD-10-CM | POA: Diagnosis present

## 2024-04-20 DIAGNOSIS — Z85850 Personal history of malignant neoplasm of thyroid: Secondary | ICD-10-CM | POA: Diagnosis not present

## 2024-04-20 DIAGNOSIS — I5033 Acute on chronic diastolic (congestive) heart failure: Secondary | ICD-10-CM | POA: Diagnosis not present

## 2024-04-20 DIAGNOSIS — Z7951 Long term (current) use of inhaled steroids: Secondary | ICD-10-CM

## 2024-04-20 DIAGNOSIS — Z556 Problems related to health literacy: Secondary | ICD-10-CM

## 2024-04-20 DIAGNOSIS — E039 Hypothyroidism, unspecified: Secondary | ICD-10-CM | POA: Diagnosis present

## 2024-04-20 DIAGNOSIS — F32A Depression, unspecified: Secondary | ICD-10-CM | POA: Diagnosis present

## 2024-04-20 DIAGNOSIS — E1165 Type 2 diabetes mellitus with hyperglycemia: Secondary | ICD-10-CM | POA: Diagnosis present

## 2024-04-20 DIAGNOSIS — K219 Gastro-esophageal reflux disease without esophagitis: Secondary | ICD-10-CM | POA: Diagnosis present

## 2024-04-20 DIAGNOSIS — M109 Gout, unspecified: Secondary | ICD-10-CM | POA: Diagnosis present

## 2024-04-20 DIAGNOSIS — J440 Chronic obstructive pulmonary disease with acute lower respiratory infection: Secondary | ICD-10-CM | POA: Diagnosis present

## 2024-04-20 DIAGNOSIS — Y95 Nosocomial condition: Secondary | ICD-10-CM | POA: Diagnosis present

## 2024-04-20 DIAGNOSIS — Z8709 Personal history of other diseases of the respiratory system: Secondary | ICD-10-CM

## 2024-04-20 DIAGNOSIS — Z79899 Other long term (current) drug therapy: Secondary | ICD-10-CM

## 2024-04-20 DIAGNOSIS — Z91030 Bee allergy status: Secondary | ICD-10-CM

## 2024-04-20 DIAGNOSIS — E66813 Obesity, class 3: Secondary | ICD-10-CM | POA: Diagnosis present

## 2024-04-20 DIAGNOSIS — G8929 Other chronic pain: Secondary | ICD-10-CM | POA: Diagnosis present

## 2024-04-20 DIAGNOSIS — Z86718 Personal history of other venous thrombosis and embolism: Secondary | ICD-10-CM

## 2024-04-20 DIAGNOSIS — Z6841 Body Mass Index (BMI) 40.0 and over, adult: Secondary | ICD-10-CM | POA: Diagnosis not present

## 2024-04-20 DIAGNOSIS — J301 Allergic rhinitis due to pollen: Secondary | ICD-10-CM | POA: Diagnosis not present

## 2024-04-20 DIAGNOSIS — R6520 Severe sepsis without septic shock: Secondary | ICD-10-CM | POA: Diagnosis present

## 2024-04-20 DIAGNOSIS — J309 Allergic rhinitis, unspecified: Secondary | ICD-10-CM | POA: Diagnosis present

## 2024-04-20 DIAGNOSIS — E785 Hyperlipidemia, unspecified: Secondary | ICD-10-CM | POA: Diagnosis present

## 2024-04-20 DIAGNOSIS — E782 Mixed hyperlipidemia: Secondary | ICD-10-CM

## 2024-04-20 DIAGNOSIS — J189 Pneumonia, unspecified organism: Secondary | ICD-10-CM | POA: Diagnosis present

## 2024-04-20 DIAGNOSIS — E662 Morbid (severe) obesity with alveolar hypoventilation: Secondary | ICD-10-CM | POA: Diagnosis present

## 2024-04-20 DIAGNOSIS — E119 Type 2 diabetes mellitus without complications: Secondary | ICD-10-CM | POA: Diagnosis not present

## 2024-04-20 DIAGNOSIS — J9601 Acute respiratory failure with hypoxia: Principal | ICD-10-CM

## 2024-04-20 DIAGNOSIS — Z7982 Long term (current) use of aspirin: Secondary | ICD-10-CM

## 2024-04-20 DIAGNOSIS — J9621 Acute and chronic respiratory failure with hypoxia: Secondary | ICD-10-CM | POA: Diagnosis present

## 2024-04-20 DIAGNOSIS — R600 Localized edema: Secondary | ICD-10-CM | POA: Diagnosis not present

## 2024-04-20 DIAGNOSIS — Z79890 Hormone replacement therapy (postmenopausal): Secondary | ICD-10-CM | POA: Diagnosis not present

## 2024-04-20 DIAGNOSIS — Z794 Long term (current) use of insulin: Secondary | ICD-10-CM | POA: Diagnosis not present

## 2024-04-20 DIAGNOSIS — A419 Sepsis, unspecified organism: Secondary | ICD-10-CM | POA: Diagnosis present

## 2024-04-20 DIAGNOSIS — Z87891 Personal history of nicotine dependence: Secondary | ICD-10-CM | POA: Diagnosis not present

## 2024-04-20 DIAGNOSIS — E1142 Type 2 diabetes mellitus with diabetic polyneuropathy: Secondary | ICD-10-CM | POA: Diagnosis present

## 2024-04-20 DIAGNOSIS — I10 Essential (primary) hypertension: Secondary | ICD-10-CM

## 2024-04-20 DIAGNOSIS — Z833 Family history of diabetes mellitus: Secondary | ICD-10-CM | POA: Diagnosis not present

## 2024-04-20 DIAGNOSIS — F419 Anxiety disorder, unspecified: Secondary | ICD-10-CM | POA: Diagnosis present

## 2024-04-20 DIAGNOSIS — I5032 Chronic diastolic (congestive) heart failure: Secondary | ICD-10-CM | POA: Diagnosis not present

## 2024-04-20 DIAGNOSIS — N179 Acute kidney failure, unspecified: Secondary | ICD-10-CM | POA: Diagnosis present

## 2024-04-20 DIAGNOSIS — Z9109 Other allergy status, other than to drugs and biological substances: Secondary | ICD-10-CM

## 2024-04-20 DIAGNOSIS — Z886 Allergy status to analgesic agent status: Secondary | ICD-10-CM

## 2024-04-20 DIAGNOSIS — Z1152 Encounter for screening for COVID-19: Secondary | ICD-10-CM | POA: Diagnosis not present

## 2024-04-20 LAB — URINALYSIS, COMPLETE (UACMP) WITH MICROSCOPIC
Bilirubin Urine: NEGATIVE
Glucose, UA: NEGATIVE mg/dL
Hgb urine dipstick: NEGATIVE
Ketones, ur: NEGATIVE mg/dL
Leukocytes,Ua: NEGATIVE
Nitrite: NEGATIVE
Protein, ur: 30 mg/dL — AB
Specific Gravity, Urine: 1.013 (ref 1.005–1.030)
pH: 5 (ref 5.0–8.0)

## 2024-04-20 LAB — COMPREHENSIVE METABOLIC PANEL WITH GFR
ALT: 24 U/L (ref 0–44)
AST: 29 U/L (ref 15–41)
Albumin: 4.2 g/dL (ref 3.5–5.0)
Alkaline Phosphatase: 56 U/L (ref 38–126)
Anion gap: 10 (ref 5–15)
BUN: 20 mg/dL (ref 8–23)
CO2: 35 mmol/L — ABNORMAL HIGH (ref 22–32)
Calcium: 7.9 mg/dL — ABNORMAL LOW (ref 8.9–10.3)
Chloride: 96 mmol/L — ABNORMAL LOW (ref 98–111)
Creatinine, Ser: 1.1 mg/dL — ABNORMAL HIGH (ref 0.44–1.00)
GFR, Estimated: 56 mL/min — ABNORMAL LOW
Glucose, Bld: 153 mg/dL — ABNORMAL HIGH (ref 70–99)
Potassium: 4.9 mmol/L (ref 3.5–5.1)
Sodium: 140 mmol/L (ref 135–145)
Total Bilirubin: 0.2 mg/dL (ref 0.0–1.2)
Total Protein: 7.2 g/dL (ref 6.5–8.1)

## 2024-04-20 LAB — CBC
HCT: 37.4 % (ref 36.0–46.0)
Hemoglobin: 11.2 g/dL — ABNORMAL LOW (ref 12.0–15.0)
MCH: 30.9 pg (ref 26.0–34.0)
MCHC: 29.9 g/dL — ABNORMAL LOW (ref 30.0–36.0)
MCV: 103.3 fL — ABNORMAL HIGH (ref 80.0–100.0)
Platelets: 253 K/uL (ref 150–400)
RBC: 3.62 MIL/uL — ABNORMAL LOW (ref 3.87–5.11)
RDW: 13.3 % (ref 11.5–15.5)
WBC: 12.1 K/uL — ABNORMAL HIGH (ref 4.0–10.5)
nRBC: 0 % (ref 0.0–0.2)

## 2024-04-20 LAB — GLUCOSE, CAPILLARY
Glucose-Capillary: 117 mg/dL — ABNORMAL HIGH (ref 70–99)
Glucose-Capillary: 168 mg/dL — ABNORMAL HIGH (ref 70–99)

## 2024-04-20 LAB — RESP PANEL BY RT-PCR (RSV, FLU A&B, COVID)  RVPGX2
Influenza A by PCR: NEGATIVE
Influenza B by PCR: NEGATIVE
Resp Syncytial Virus by PCR: NEGATIVE
SARS Coronavirus 2 by RT PCR: NEGATIVE

## 2024-04-20 LAB — MAGNESIUM: Magnesium: 1.9 mg/dL (ref 1.7–2.4)

## 2024-04-20 LAB — PRO BRAIN NATRIURETIC PEPTIDE: Pro Brain Natriuretic Peptide: 257 pg/mL

## 2024-04-20 LAB — CBG MONITORING, ED
Glucose-Capillary: 209 mg/dL — ABNORMAL HIGH (ref 70–99)
Glucose-Capillary: 177 mg/dL — ABNORMAL HIGH (ref 70–99)

## 2024-04-20 LAB — I-STAT CG4 LACTIC ACID, ED
Lactic Acid, Venous: 2.3 mmol/L (ref 0.5–1.9)
Lactic Acid, Venous: 1.4 mmol/L (ref 0.5–1.9)

## 2024-04-20 LAB — STREP PNEUMONIAE URINARY ANTIGEN: Strep Pneumo Urinary Antigen: NEGATIVE

## 2024-04-20 LAB — PHOSPHORUS: Phosphorus: 4.2 mg/dL (ref 2.5–4.6)

## 2024-04-20 LAB — PROCALCITONIN: Procalcitonin: <0.1 ng/mL

## 2024-04-20 MED ORDER — GUAIFENESIN ER 600 MG PO TB12
1200.0000 mg | ORAL_TABLET | Freq: Two times a day (BID) | ORAL | Status: DC
  Administered 2024-04-20 – 2024-04-27 (×14): 1200 mg via ORAL
  Filled 2024-04-20 (×14): qty 2

## 2024-04-20 MED ORDER — MELATONIN 3 MG PO TABS
3.0000 mg | ORAL_TABLET | Freq: Every evening | ORAL | Status: DC | PRN
  Administered 2024-04-21 – 2024-04-24 (×5): 3 mg via ORAL
  Filled 2024-04-20 (×5): qty 1

## 2024-04-20 MED ORDER — VANCOMYCIN HCL 1750 MG/350ML IV SOLN
1750.0000 mg | INTRAVENOUS | Status: DC
  Administered 2024-04-20 – 2024-04-21 (×2): 1750 mg via INTRAVENOUS
  Filled 2024-04-20 (×2): qty 350

## 2024-04-20 MED ORDER — UMECLIDINIUM-VILANTEROL 62.5-25 MCG/ACT IN AEPB
1.0000 | INHALATION_SPRAY | Freq: Every day | RESPIRATORY_TRACT | Status: DC
  Administered 2024-04-20 – 2024-04-27 (×3): 1 via RESPIRATORY_TRACT
  Filled 2024-04-20 (×2): qty 14

## 2024-04-20 MED ORDER — HYDROCODONE-ACETAMINOPHEN 5-325 MG PO TABS
1.0000 | ORAL_TABLET | Freq: Once | ORAL | Status: AC
  Administered 2024-04-20: 1 via ORAL
  Filled 2024-04-20: qty 1

## 2024-04-20 MED ORDER — ROSUVASTATIN CALCIUM 5 MG PO TABS
10.0000 mg | ORAL_TABLET | Freq: Every evening | ORAL | Status: DC
  Administered 2024-04-20 – 2024-04-26 (×7): 10 mg via ORAL
  Filled 2024-04-20 (×7): qty 2

## 2024-04-20 MED ORDER — ENOXAPARIN SODIUM 80 MG/0.8ML IJ SOSY
70.0000 mg | PREFILLED_SYRINGE | INTRAMUSCULAR | Status: DC
  Administered 2024-04-20 – 2024-04-27 (×8): 70 mg via SUBCUTANEOUS
  Filled 2024-04-20 (×4): qty 0.8
  Filled 2024-04-20: qty 0.7
  Filled 2024-04-20 (×3): qty 0.8

## 2024-04-20 MED ORDER — FENOFIBRATE 54 MG PO TABS
54.0000 mg | ORAL_TABLET | Freq: Every day | ORAL | Status: DC
  Administered 2024-04-20 – 2024-04-27 (×8): 54 mg via ORAL
  Filled 2024-04-20 (×9): qty 1

## 2024-04-20 MED ORDER — BUDESONIDE 0.25 MG/2ML IN SUSP
0.2500 mg | Freq: Two times a day (BID) | RESPIRATORY_TRACT | Status: DC
  Administered 2024-04-20 – 2024-04-27 (×12): 0.25 mg via RESPIRATORY_TRACT
  Filled 2024-04-20 (×14): qty 2

## 2024-04-20 MED ORDER — VANCOMYCIN HCL 2000 MG/400ML IV SOLN
2000.0000 mg | Freq: Once | INTRAVENOUS | Status: AC
  Administered 2024-04-20: 2000 mg via INTRAVENOUS
  Filled 2024-04-20: qty 400

## 2024-04-20 MED ORDER — SODIUM CHLORIDE 0.9 % IV SOLN
2.0000 g | Freq: Three times a day (TID) | INTRAVENOUS | Status: AC
  Administered 2024-04-20 – 2024-04-26 (×20): 2 g via INTRAVENOUS
  Filled 2024-04-20 (×20): qty 12.5

## 2024-04-20 MED ORDER — BENZONATATE 100 MG PO CAPS
200.0000 mg | ORAL_CAPSULE | Freq: Three times a day (TID) | ORAL | Status: DC | PRN
  Administered 2024-04-21 – 2024-04-26 (×2): 200 mg via ORAL
  Filled 2024-04-20 (×2): qty 2

## 2024-04-20 MED ORDER — BUSPIRONE HCL 5 MG PO TABS
30.0000 mg | ORAL_TABLET | Freq: Two times a day (BID) | ORAL | Status: DC
  Administered 2024-04-20 – 2024-04-27 (×14): 30 mg via ORAL
  Filled 2024-04-20 (×14): qty 6

## 2024-04-20 MED ORDER — INSULIN ASPART 100 UNIT/ML IJ SOLN
0.0000 [IU] | Freq: Every day | INTRAMUSCULAR | Status: DC
  Administered 2024-04-25: 2 [IU] via SUBCUTANEOUS
  Filled 2024-04-20 (×2): qty 2

## 2024-04-20 MED ORDER — LEVOTHYROXINE SODIUM 75 MCG PO TABS
125.0000 ug | ORAL_TABLET | Freq: Every day | ORAL | Status: DC
  Administered 2024-04-20 – 2024-04-26 (×7): 125 ug via ORAL
  Filled 2024-04-20 (×7): qty 1

## 2024-04-20 MED ORDER — ONDANSETRON HCL 4 MG/2ML IJ SOLN
4.0000 mg | Freq: Four times a day (QID) | INTRAMUSCULAR | Status: DC | PRN
  Administered 2024-04-25: 4 mg via INTRAVENOUS
  Filled 2024-04-20: qty 2

## 2024-04-20 MED ORDER — LORATADINE 10 MG PO TABS
10.0000 mg | ORAL_TABLET | Freq: Every day | ORAL | Status: DC
  Administered 2024-04-20 – 2024-04-26 (×7): 10 mg via ORAL
  Filled 2024-04-20 (×7): qty 1

## 2024-04-20 MED ORDER — GUAIFENESIN ER 600 MG PO TB12
600.0000 mg | ORAL_TABLET | Freq: Two times a day (BID) | ORAL | Status: DC
  Administered 2024-04-20: 600 mg via ORAL
  Filled 2024-04-20: qty 1

## 2024-04-20 MED ORDER — GABAPENTIN 100 MG PO CAPS
100.0000 mg | ORAL_CAPSULE | Freq: Two times a day (BID) | ORAL | Status: DC
  Administered 2024-04-20 – 2024-04-27 (×14): 100 mg via ORAL
  Filled 2024-04-20 (×14): qty 1

## 2024-04-20 MED ORDER — SODIUM CHLORIDE 0.9 % IV SOLN
2.0000 g | Freq: Once | INTRAVENOUS | Status: AC
  Administered 2024-04-20: 2 g via INTRAVENOUS
  Filled 2024-04-20: qty 12.5

## 2024-04-20 MED ORDER — ACETAMINOPHEN 650 MG RE SUPP: 650.0000 mg | Freq: Four times a day (QID) | RECTAL | Status: DC | PRN

## 2024-04-20 MED ORDER — VANCOMYCIN HCL IN DEXTROSE 1-5 GM/200ML-% IV SOLN: 1000.0000 mg | Freq: Once | INTRAVENOUS | Status: DC

## 2024-04-20 MED ORDER — ACETAMINOPHEN 325 MG PO TABS
650.0000 mg | ORAL_TABLET | Freq: Four times a day (QID) | ORAL | Status: DC | PRN
  Administered 2024-04-20 – 2024-04-24 (×6): 650 mg via ORAL
  Filled 2024-04-20 (×6): qty 2

## 2024-04-20 MED ORDER — ALBUTEROL SULFATE (2.5 MG/3ML) 0.083% IN NEBU
2.5000 mg | INHALATION_SOLUTION | RESPIRATORY_TRACT | Status: DC | PRN
  Filled 2024-04-20: qty 3

## 2024-04-20 MED ORDER — INSULIN ASPART 100 UNIT/ML IJ SOLN
0.0000 [IU] | Freq: Three times a day (TID) | INTRAMUSCULAR | Status: DC
  Administered 2024-04-20: 3 [IU] via SUBCUTANEOUS
  Administered 2024-04-20 – 2024-04-21 (×3): 2 [IU] via SUBCUTANEOUS
  Administered 2024-04-21: 1 [IU] via SUBCUTANEOUS
  Administered 2024-04-22: 3 [IU] via SUBCUTANEOUS
  Administered 2024-04-22: 2 [IU] via SUBCUTANEOUS
  Administered 2024-04-22 – 2024-04-23 (×3): 1 [IU] via SUBCUTANEOUS
  Administered 2024-04-23 – 2024-04-24 (×2): 2 [IU] via SUBCUTANEOUS
  Administered 2024-04-24: 1 [IU] via SUBCUTANEOUS
  Administered 2024-04-24: 3 [IU] via SUBCUTANEOUS
  Administered 2024-04-25: 1 [IU] via SUBCUTANEOUS
  Administered 2024-04-25: 2 [IU] via SUBCUTANEOUS
  Administered 2024-04-25: 1 [IU] via SUBCUTANEOUS
  Administered 2024-04-26 (×2): 3 [IU] via SUBCUTANEOUS
  Administered 2024-04-27: 2 [IU] via SUBCUTANEOUS
  Filled 2024-04-20 (×2): qty 3
  Filled 2024-04-20: qty 2
  Filled 2024-04-20: qty 1
  Filled 2024-04-20: qty 3
  Filled 2024-04-20: qty 4
  Filled 2024-04-20: qty 3
  Filled 2024-04-20: qty 1
  Filled 2024-04-20: qty 2
  Filled 2024-04-20: qty 3
  Filled 2024-04-20 (×2): qty 2
  Filled 2024-04-20: qty 1
  Filled 2024-04-20: qty 3
  Filled 2024-04-20: qty 1
  Filled 2024-04-20 (×2): qty 2
  Filled 2024-04-20 (×3): qty 1

## 2024-04-20 NOTE — H&P (Signed)
 " History and Physical      Michele Owens FMW:[id number] DOB: [birth year] DOA: 04/20/2024; DOS: 04/20/2024  PCP: [name], MD  Patient coming from: home   I have personally briefly reviewed patient's old medical records in [facility]  Chief Complaint: Shortness of breath  HPI: Michele Owens is a 64 y.o. female with medical history significant for chronic diastolic heart failure, COPD, chronic Evoxac respiratory failure on 2 L continuous nasal cannula, type 2 diabetes mellitus, who is admitted to [facility] on 04/20/2024 with severe sepsis due to HCAP pneumonia complicated by acute on chronic hypoxic respiratory failure after presenting from home to [facility] ED complaining of shortness of breath.   The patient was hospitalized at [facility] from 03/23/2024 to 03/25/2024 for pneumonia, treated with IV antibiotics, with symptoms initially improving, before returning to the ED on 03/31/2024, with complaints of worsening shortness of breath, at which time she was found to have new pneumonia, treated with doxycycline  as an outpatient.  Initially shortness breath improved, before experiencing worsening shortness of breath over the course of the last week associated subjective fever, increasing productive cough.  Not associate with any chest pain.  She has history of chronic hypoxic respiratory failure to his continuous nasal cannula.  Also notes history of chronic diastolic heart failure, with most recent echo in October 2025 notable for LVEF 55 to 60%, indeterminate diastolic parameters, normal right ventricular systolic function.  No recent worsening orthopnea or PND.  Also has a history of COPD in setting of former smoking habits.  No recent wheezing and reports good compliance with her outpatient respiratory regimen.  No dysuria or gross hematuria.  It is noted that she was previously on multiple thyroid  medications, including torsemide , although it appears that this has been held  over the course of the last month.   ED Course:  Vital signs in the ED were notable for the following: Afebrile; respirate 1725; systolic blood pressures low 100s; oxygen  saturation initially 88% on her baseline 2 L of cannula so is increasing the range of 93 to 94% on 3 L nasal cannula.  Labs were notable for the following: CMP notable for potassium 4.9, creatinine 1.1, glucose 153, liver enzymes within limits.  proBNP 257.  0.830 03/31/2024, urinalysis notable for no white blood cells, nitrate negative, leukocyte esterase negative.  CBC overall with cell count 12 is 100 hemoglobin 11.2 compared to 12.2 on 03/31/2024.  Blood cultures x 2 collected prior to initiation of antibiotics.  COVID analysis, RSV PCR negative.  Per my interpretation, EKG in ED demonstrated the following: Sinus rhythm heart rate 79, normal intervals, no evidence of T wave or ST changes, including axis to the patient.  Imaging in the ED, per corresponding formal radiology read, was notable for the following: 1 view chest x-ray comparison to most recent prior chest x-ray on 03/29/2024, showed interval worsening of right middle lobe infiltrate concerning for worsening pneumonia, will demonstrate no evidence of edema, effusion, or pneumothorax.  While in the ED, the following were administered: IV vancomycin , cefepime .  Subsequently, the patient was admitted for further evaluation management of 3 sepsis due to HCAP pneumonia, [place] acute on chronic hypoxic respiratory failure     Review of Systems: As per HPI otherwise 10 point review of systems negative.   Past Medical History:  Diagnosis Date   Anxiety    Arthritis    Asthma    Chronic diastolic CHF (congestive heart failure) (HCC)  COPD (chronic obstructive pulmonary disease) (HCC)    Uses Oxygen  at night   Depression    Diabetes mellitus without complication (HCC)    GERD (gastroesophageal reflux disease)    Gout    Headache    migraines   History of DVT  of lower extremity    Hypertension    Morbid obesity with BMI of 45.0-49.9, adult (HCC)    Thyroid  cancer (HCC) 09/23/2016    Past Surgical History:  Procedure Laterality Date   BUNIONECTOMY Bilateral    CARDIAC CATHETERIZATION N/A 06/23/2015   Procedure: Right/Left Heart Cath and Coronary Angiography;  Surgeon: Ezra GORMAN Shuck, MD;  Location: [facility] INVASIVE CV LAB;  Service: Cardiovascular;  Laterality: N/A;   COLONOSCOPY WITH PROPOFOL  N/A 12/15/2015   Procedure: COLONOSCOPY WITH PROPOFOL ;  Surgeon: Norleen Hint, MD;  Location: [facility] ENDOSCOPY;  Service: Endoscopy;  Laterality: N/A;   RIGHT HEART CATH N/A 10/01/2019   Procedure: RIGHT HEART CATH;  Surgeon: Shuck Ezra GORMAN, MD;  Location: [facility] INVASIVE CV LAB;  Service: Cardiovascular;  Laterality: N/A;   RIGHT HEART CATH N/A 03/03/2024   Procedure: RIGHT HEART CATH;  Surgeon: Shuck Ezra GORMAN, MD;  Location: [facility] INVASIVE CV LAB;  Service: Cardiovascular;  Laterality: N/A;   THYROIDECTOMY  09/19/2016   THYROIDECTOMY N/A 09/19/2016   Procedure: TOTAL THYROIDECTOMY;  Surgeon: Eletha Boas, MD;  Location: [facility] OR;  Service: General;  Laterality: N/A;   TONSILLECTOMY     TOTAL ABDOMINAL HYSTERECTOMY  07/14/10    Social History:  reports that she quit smoking about 7 years ago. Her smoking use included cigarettes. She started smoking about 48 years ago. She has a 20.5 pack-year smoking history. She has never used smokeless tobacco. She reports that she does not currently use alcohol. She reports that she does not currently use drugs after having used the following drugs: Marijuana and Cocaine.   Allergies[1]  Family History  Problem Relation Age of Onset   Cancer Father        thought to be due to exposure to concrete   Diabetes Mother    Thyroid  cancer Mother        dx in her 42s-60s   Cancer Maternal Uncle        2 uncles with cancer NOS   Brain cancer Paternal Aunt    Cancer Cousin        maternal first cousin - NOS   Cancer Cousin         maternal first cousin - NOS    Family history reviewed and not pertinent    Prior to Admission medications  Medication Sig Start Date End Date Taking? Authorizing Provider  ACCU-CHEK GUIDE TEST test strip USE TO CHECK BLOOD SUGAR 3 TIMES DAILY 03/04/24   Webb, Padonda B, FNP  Accu-Chek Softclix Lancets lancets Use to check blood sugar 3 times daily 12/26/23   [name], MD  acetaminophen  (TYLENOL ) 325 MG tablet Take 2 tablets (650 mg total) by mouth every 6 (six) hours as needed for mild pain (pain score 1-3) or fever (or Fever >/= 101). 01/23/24   Arrien, Elidia Sieving, MD  albuterol  (PROVENTIL ) (2.5 MG/3ML) 0.083% nebulizer solution Take 3 mLs (2.5 mg total) by nebulization every 6 (six) hours as needed for wheezing or shortness of breath. 03/13/24   Donnajean Lynwood DEL, PA-C  albuterol  (VENTOLIN  HFA) 108 (90 Base) MCG/ACT inhaler Inhale 1-2 puffs into the lungs every 6 (six) hours as needed for wheezing or shortness of breath. 12/26/23  [name], MD  allopurinol  (ZYLOPRIM ) 100 MG tablet Take 1 tablet (100 mg total) by mouth daily. 11/13/23 11/07/24  [name], MD  aspirin  EC 81 MG tablet Take 1 tablet (81 mg total) by mouth daily. Swallow whole. 11/13/23 11/07/24  [name], MD  blood glucose meter kit and supplies KIT Dispense based on patient and insurance preference. Use up to four times daily as directed. 12/25/20   Lue Elsie BROCKS, MD  Blood Glucose Monitoring Suppl DEVI 1 each by Does not apply route in the morning, at noon, and at bedtime. May substitute to any manufacturer covered by patient's insurance. 11/13/23   [name], MD  budesonide  (PULMICORT ) 0.25 MG/2ML nebulizer solution Inhale 2 mLs (0.25 mg total) by nebulization 2 (two) times daily. 11/13/23   [name], MD  busPIRone  (BUSPAR ) 30 MG tablet Take 1 tablet (30 mg total) by mouth 2 (two) times daily. 12/05/23 11/29/24  [name], MD  butalbital -acetaminophen -caffeine  (FIORICET )  50-325-40 MG tablet Take 1 tablet by mouth every 6 (six) hours as needed for headache. 04/15/24   Thedora Garnette HERO, MD  carvedilol  (COREG ) 6.25 MG tablet Take 1 tablet (6.25 mg total) by mouth 2 (two) times daily. 12/26/23   [name], MD  clotrimazole  (LOTRIMIN ) 1 % cream Apply topically 2 (two) times daily. 12/13/23   [name], MD  diclofenac  Sodium (VOLTAREN ) 1 % GEL Apply 4 g topically 4 (four) times daily. 12/15/23   Freddi Hamilton, MD  dicyclomine  (BENTYL ) 20 MG tablet Take 1 tablet (20 mg total) by mouth 2 (two) times daily. 04/15/24   Thedora Garnette HERO, MD  EPINEPHrine  0.3 mg/0.3 mL IJ SOAJ injection Inject 0.3 mg into the muscle once as needed for anaphylaxis.    [provider]  fenofibrate  (TRICOR ) 145 MG tablet Take 1 tablet (145 mg total) by mouth daily. 12/26/23   [name], MD  FEROSUL 325 (65 Fe) MG tablet Take 1 tablet (325 mg total) by mouth daily. 12/26/23   [name], MD  gabapentin  (NEURONTIN ) 100 MG capsule Take 1 capsule (100 mg total) by mouth 2 (two) times daily. 04/15/24   Thedora Garnette HERO, MD  HYDROcodone -acetaminophen  (NORCO/VICODIN) 5-325 MG tablet Take 1 tablet by mouth every 4 (four) hours as needed for severe pain (pain score 7-10). 03/25/24   Sigdel, Derryl, MD  ipratropium-albuterol  (DUONEB) 0.5-2.5 (3) MG/3ML SOLN Take 3 mLs by nebulization every 4 (four) hours as needed. 01/23/24   Arrien, Mauricio Daniel, MD  levocetirizine (XYZAL ) 5 MG tablet TAKE 1 TABLET (5 MG TOTAL) BY MOUTH AT BEDTIME. 12/17/23   [name], MD  levothyroxine  (SYNTHROID ) 125 MCG tablet TAKE 1 TABLET (125 MCG TOTAL) BY MOUTH AT BEDTIME. 03/20/24   [name], MD  lidocaine  (HM LIDOCAINE  PATCH) 4 % Place 1 patch onto the skin daily. Patient not taking: Reported on 03/23/2024 03/13/24   Donnajean Lynwood DEL, PA-C  [Paused] losartan  (COZAAR ) 25 MG tablet Take 1 tablet (25 mg total) by mouth daily. Wait to take this until your doctor or other care provider tells  you to start again. 11/13/23   [name], MD  lurasidone  (LATUDA ) 20 MG TABS tablet Take 1 tablet (20 mg total) by mouth at bedtime. Take with a snack. This is for mood and sleep. 12/25/23 12/19/24  [name], MD  [Paused] metolazone  (ZAROXOLYN ) 2.5 MG tablet Take 1 tablet (2.5 mg total) by mouth as directed. TAKE ONE  DOSE OF METOLAZONE  2.5MG  TODAY, AND TOMORROW WITH (2) TABLETS OF POTASSIUM WITH THIS Patient not taking: Reported on 03/23/2024 Wait to take this until your doctor or other care provider tells you to start again. 03/19/24   Hayes Beckey CROME, NP  nitroGLYCERIN  (NITROSTAT ) 0.4 MG SL tablet Place 1 tablet (0.4 mg total) under the tongue every 5 (five) minutes as needed for chest pain. 04/15/24   Thedora Garnette HERO, MD  omeprazole  (PRILOSEC) 20 MG capsule TAKE 2 CAPSULES (40 MG TOTAL) BY MOUTH DAILY (2AM) 03/20/24   [name], MD  ondansetron  (ZOFRAN -ODT) 4 MG disintegrating tablet Take 1 tablet (4 mg total) by mouth every 8 (eight) hours as needed for nausea or vomiting. 12/26/23   [name], MD  OXYGEN  Inhale 2 L into the lungs continuous.    [provider]  potassium chloride  (KLOR-CON  M) 10 MEQ tablet Take 2 tablets (20 mEq total) by mouth daily. 03/03/24   Rolan Ezra RAMAN, MD  Rimegepant Sulfate (NURTEC) 75 MG TBDP Take 1 tablet (75 mg total) by mouth every other day. Prevents and Treats Migraine Headaches Patient not taking: Reported on 03/23/2024 12/25/23 12/19/24  [name], MD  rizatriptan  (MAXALT ) 10 MG tablet Take 1 tablet (10 mg total) by mouth as needed for migraine. May repeat in 2 hours if needed 04/01/24   Billy Knee, FNP  rosuvastatin  (CRESTOR ) 10 MG tablet Take 1 tablet (10 mg total) by mouth every evening. 11/13/23   [name], MD  [Paused] spironolactone  (ALDACTONE ) 25 MG tablet Take 0.5 tablets (12.5 mg total) by mouth daily. Wait to take this until your doctor or other care provider tells you to start again. 01/23/24    Arrien, Mauricio Daniel, MD  [Paused] torsemide  (DEMADEX ) 20 MG tablet Take 4 tablets (80 mg total) by mouth 2 (two) times daily. Wait to take this until your doctor or other care provider tells you to start again. 03/19/24   Hayes Beckey CROME, NP  umeclidinium-vilanterol (ANORO ELLIPTA ) 62.5-25 MCG/ACT AEPB Inhale 1 puff into the lungs daily. 01/02/24   [name], MD  Vitamin D , Ergocalciferol , (DRISDOL ) 1.25 MG (50000 UNIT) CAPS capsule Take 1 capsule (50,000 Units total) by mouth every 7 (seven) days. 11/13/23   [name], MD     Objective    Physical Exam: Vitals:   04/20/24 0100 04/20/24 0115 04/20/24 0130 04/20/24 0145  BP: (!) 128/57 116/62 115/67 129/72  Pulse: 79 79 78 80  Resp: 17 14 20 19   Temp:      TempSrc:      SpO2: 91% 93% 93% 91%  Weight:      Height:        General: appears to be stated age; alert, oriented Skin: warm, dry, no rash Head:  AT/[place] Mouth:  Oral mucosa membranes appear moist, normal dentition Neck: supple; trachea midline Heart:  RRR; did not appreciate any M/R/G Lungs: CTAB, did not appreciate any wheezes, rales, or rhonchi Abdomen: + BS; soft, ND, NT Extremities: no peripheral edema, no muscle wasting          Labs on Admission: I have personally reviewed following labs and imaging studies  CBC: Recent Labs  Lab 04/20/24 0157  WBC 12.1*  HGB 11.2*  HCT 37.4  MCV 103.3*  PLT 253   Basic Metabolic Panel: Recent Labs  Lab 04/20/24 0157  NA 140  K 4.9  CL 96*  CO2 35*  GLUCOSE 153*  BUN 20  CREATININE 1.10*  CALCIUM  7.9*   GFR: Estimated Creatinine Clearance: 77.2 mL/min (A) (by C-G formula based on SCr of 1.1 mg/dL (H)). Liver Function Tests: Recent Labs  Lab 04/20/24 0157  AST 29  ALT 24  ALKPHOS 56  BILITOT 0.2  PROT 7.2  ALBUMIN  4.2   No results for input(s): LIPASE, AMYLASE in the last 168 hours. No results for input(s): AMMONIA  in the last 168 hours. Coagulation Profile: No results for  input(s): INR, PROTIME in the last 168 hours. Cardiac Enzymes: No results for input(s): CKTOTAL, CKMB, CKMBINDEX, TROPONINI in the last 168 hours. BNP (last 3 results) Recent Labs    01/16/24 2043 03/31/24 2042 04/20/24 0157  PROBNP 183.0 183.0 257.0   HbA1C: No results for input(s): HGBA1C in the last 72 hours. CBG: No results for input(s): GLUCAP in the last 168 hours. Lipid Profile: No results for input(s): CHOL, HDL, LDLCALC, TRIG, CHOLHDL, LDLDIRECT in the last 72 hours. Thyroid  Function Tests: No results for input(s): TSH, T4TOTAL, FREET4, T3FREE, THYROIDAB in the last 72 hours. Anemia Panel: No results for input(s): VITAMINB12, FOLATE, FERRITIN, TIBC, IRON, RETICCTPCT in the last 72 hours. Urine analysis:    Component Value Date/Time   COLORURINE YELLOW 03/23/2024 1344   APPEARANCEUR CLEAR 03/23/2024 1344   LABSPEC 1.017 03/23/2024 1344   PHURINE 5.0 03/23/2024 1344   GLUCOSEU NEGATIVE 03/23/2024 1344   HGBUR NEGATIVE 03/23/2024 1344   BILIRUBINUR NEGATIVE 03/23/2024 1344   KETONESUR NEGATIVE 03/23/2024 1344   PROTEINUR NEGATIVE 03/23/2024 1344   UROBILINOGEN 0.2 02/28/2012 2004   NITRITE NEGATIVE 03/23/2024 1344   LEUKOCYTESUR NEGATIVE 03/23/2024 1344    Radiological Exams on Admission: DG Foot 2 Views Left Result Date: 04/20/2024 EXAM: 2 VIEW(S) XRAY OF THE LEFT FOOT 04/20/2024 01:49:38 AM COMPARISON: None available. CLINICAL HISTORY: sob FINDINGS: BONES AND JOINTS: No acute fracture. No malalignment. Plantar calcaneal spur. Degenerative changes of the distal interphalangeal joint of the third toe. Degenerative changes of the tarsometatarsal joints. SOFT TISSUES: Dorsal forefoot subcutaneous soft tissue edema. IMPRESSION: 1. Dorsal forefoot subcutaneous soft tissue edema. 2. Third ray and midfoot degenerative changes Electronically signed by: Dorethia Molt MD MD 04/20/2024 01:57 AM EST RP Workstation: [id number]   DG  Chest Port 1 View Result Date: 04/20/2024 EXAM: 1 VIEW(S) XRAY OF THE CHEST 04/20/2024 01:49:38 AM COMPARISON: 03/31/2024 CLINICAL HISTORY: sob FINDINGS: LUNGS AND PLEURA: Low lung volumes. Progressive consolidation within the right mid lung zone. Grossly stable 8 mm pulmonary nodule within the peripheral right lung base, noted on CT examination of 03/23/2024. No pleural effusion. No pneumothorax. HEART AND MEDIASTINUM: Cardiomegaly. Aortic atherosclerosis. BONES AND SOFT TISSUES: No acute osseous abnormality. IMPRESSION: 1. Progressive consolidation within the right mid lung zone. 2. Follow-up chest radiograph is recommended in 3-4 weeks, following conservative therapy, to document resolution. If persistent at that time, dedicated contrast enhanced CT imaging of the chest is recommended for further evaluation. 3. Stable 8 mm pulmonary nodule within the peripheral right lung base. 4. Cardiomegaly Electronically signed by: Dorethia Molt MD MD 04/20/2024 01:54 AM EST RP Workstation: [id number]      Assessment/Plan   Principal Problem:   HCAP (healthcare-associated pneumonia) Active Problems:   DM2 (diabetes mellitus, type 2) (HCC)   Acquired hypothyroidism   HLD (hyperlipidemia)   Acute on chronic hypoxic respiratory failure (HCC)   Chronic diastolic CHF (congestive heart failure) (HCC)   Severe sepsis (HCC)   History of COPD   Allergic rhinitis        #) Severe sepsis  due to HCAP pneumonia: Diagnosed on basis of 1 week of aggressive shortness of breath, worsening productive cough, subjective fever, and presenting with chest x-ray showing worsening right middle lobe infiltrate concerning for pneumonia in the context of hospitalization last month in which she received IV antibiotics.   SIRS criteria met via leukocytosis, tachypnea.  Will check stat lactic acid level every 3 hours x 2 occurrences. Of note, given the associated presence of suspected end organ damage in the form of concominant  presenting acute on chronic hypoxic respiratory failure, criteria are met for pt's sepsis to be considered severe in nature. However, in the absence of lactic acid level that is greater than or equal to 4.0, and in the absence of any associated hypotension refractory to IVF's, there are no indications for administration of a 30 mL/kg IVF bolus at this time.   Additional ED work-up/management notable for: Blood cultures x 2 were collected prior to initiation of IV vancomycin  and cefepime .  In the context of criteria for HCAP pneumonia, will continue with these broad-spectrum IV antibiotics for now.   No e/o additional infectious process at this time, including UA that is consistent with UTI, while COVID, influenza, RSV PCR all negative.  Plan: CBC w/ diff and CMP in AM.  Follow for results of blood cx's x 2. Abx: Continue IV vancomycin , cefepime , as above.  Check stat lactic acid every 3 hours x 2 occurrences.  Procalcitonin.  Strep pneumoniae antigen, Legionella urine antigen, [place] antibodies.  This is [place], flutter valve, paroxysmal pulse, scheduled guaifenesin .                   # Acute on chronic hypoxic respiratory failure: In the setting of history of chronic hypoxic respiratory failure on 2 days nasal cannula, was found to be hypoxic relative to this baseline supplemental oxygen  requirement, with presenting O2 sat in the high 80s on 2 L nasal cannula, seems improved into the range of 93 to 94% on 3 L nasal cannula.  Recently on the basis of her presents for sepsis due to HCAP pneumonia, with interval worsening in right middle lobe infiltrate, has further described above.  Plan: Further evaluation management of presenting severe sepsis due to HCAP on concomitant broad-spectrum antibiotics, as above.  Resume outpatient scheduled breathing treatments.  Albuterol  nebulizer.  Flutter valve, incentive serology.                    :#) Chronic diastolic heart  failure: With most recent echo in October 2025 notable for LVEF 55 to 60% was further detailed above.  No chronic or radiographic evidence to suggest acute Heart Failure This Time, Including No Evidence of Pulm Vascular Congestion, Interstitial Edema, Pulmonary Edema, or Pleural Effusion on Present Chest X-Ray, While proBNP Is Not Elevated.  Will further evaluate finding of holding of outpatient diuretic medications recently.  Source of this is unclear at this time.  Plan: Monitor strict I's and O's and daily weights.  Add on magnesium  level.                   # ) history of COPD:-History of suction in setting of being a former smoker.  No clinical evidence to suggest acute exacerbation thereof.  On or Ellipta, budesonide , prn albuterol  as an outpatient.  Plan: Resume outpatient respite regimen.  Prn albuterol  facilities manager.  Check magnesium  and phosphorus)                     #)  Type 2 Diabetes Mellitus: documented history of such.  Appears to be management with so modifications in the absence of any outpatient use of insulin  or oral hypoglycemic agents.  Most recent globin [place] was 6.9% when checked on 1014 2 at 5 point  Plan: accuchecks QAC and HS with low dose SSI.                    #) Hyperlipidemia: documented h/o such. On fenofibrate  and Crestor  as outpatient.   Plan: continue home statin and fenofibrate .                           #) acquired hypothyroidism: documented h/o such, on Synthroid  as outpatient.   Plan: cont home Synthroid .                           #) Allergic Rhinitis: documented h/o such, on scheduled Xyzal  as an outpatient, in the absence of any intranasal corticosteroids.  Plan: cont home Flonase .         DVT prophylaxis: SCD's   Code Status: Full code Family Communication: none Disposition Plan: Per Rounding Team Consults called: none;  Admission status:  inpatient     I SPENT GREATER THAN 75  MINUTES IN CLINICAL CARE TIME/MEDICAL DECISION-MAKING IN COMPLETING THIS ADMISSION.      Eva NOVAK Walsie Smeltz DO [facility]  From 7PM - 7AM   04/20/2024, 3:46 AM         [1]  Allergies Allergen Reactions   Bee Venom Anaphylaxis    All over my body (swelling)   Fioricet  [Butalbital -Apap-Caffeine ] Nausea And Vomiting and Rash   Ibuprofen  Rash and Other (See Comments)    Severe rash   Lamisil  [Terbinafine ] Rash and Other (See Comments)    Pt states this causes her to feel funny   Nsaids Other (See Comments)    Per MD's orders    "

## 2024-04-20 NOTE — ED Triage Notes (Addendum)
 Patient BIB [facility] with concerns of leg and back pain. Patient with concerns of swelling and pain in her left foot. Patient reports dizziness when she stand up. Patient with hx of CHF and COPD, on 2lpm via [name] at baseline. [facility] reports patient on 88% on 2lpm, patient placed on 3lpm, now 98%. Patient denies any recent falls or injuries. GCS 15.

## 2024-04-20 NOTE — ED Notes (Signed)
 Snacks given

## 2024-04-20 NOTE — ED Notes (Signed)
 ED Provider at bedside.

## 2024-04-20 NOTE — Plan of Care (Signed)

## 2024-04-20 NOTE — Progress Notes (Signed)
 " PROGRESS NOTE    Michele Owens  FMW:[id number] DOB: 1960-10-11 DOA: 04/20/2024 PCP: [name], MD   Brief Narrative: 64 year old with past medical history significant for chronic diastolic heart failure, COPD, chronic hypoxic respiratory failure on 2 L of oxygen , diabetes type 2 who presents with worsening shortness of breath found to have severe sepsis due to pneumonia complicated with acute on chronic hypoxic respiratory failure.  Of note patient had a recent hospitalization from 03/23/2024 to 03/25/2024 for pneumonia treated with antibiotics.  Presented again 12/26: Complaining of worsening shortness of breath at that time discharged on doxycycline  as an outpatient.  She presented with worsening shortness of breath over the last week associated with increased cough. Evaluation in the ED consistent with COVID RSV PCR negative, chest x-ray showed interval worsening right middle lobe infiltrate concerning for worsening pneumonia.   Assessment & Plan:   Principal Problem:   HCAP (healthcare-associated pneumonia) Active Problems:   DM2 (diabetes mellitus, type 2) (HCC)   Acquired hypothyroidism   HLD (hyperlipidemia)   Acute on chronic hypoxic respiratory failure (HCC)   Chronic diastolic CHF (congestive heart failure) (HCC)   Severe sepsis (HCC)   History of COPD   Allergic rhinitis  1-Severe sepsis due to healthcare associated pneumonia: PNA:  - Patient with worsening cough, shortness of breath.  Chest x-ray showed worsening right middle lobe infiltrate.  Lactic acid: - Follow blood cultures - Speech consultation to evaluate for aspiration pneumonia.  - Continue IV vancomycin  and cefepime  -Check a sputum culture -She will definitely need repeat CT chest in 4 weeks to follow resolution Mycoplasma pending, Legionella pending, strep pneumonia negative. MRSA PCR pending  Acute on chronic hypoxic respiratory failure - On chronic 2 L of oxygen  at home, currently  requiring 3 L - Continue Anoro Ellipta , albuterol  as needed Continue Pulmicort   Chronic diastolic heart failure - Diuretics were held during last hospitalization due to AKI.  This was supposed to be resumed by PCP on follow-up. - Will resume torsemide  tomorrow if blood pressures remain stable  History of COPD - Continue Anoro Ellipta , albuterol , Pulmicort   Diabetes type 2: - Continue sliding scale insulin   Hyperlipidemia: - Continue with fenofibrate  and Crestor   Acquired hypothyroidism - Continue Synthroid   Allergic rhinitis - Continue Claritin   Chronic pain: Resume gabapentin  Anxiety: Resume BuSpar   Estimated body mass index is 55.41 kg/m as calculated from the following:   Height as of this encounter: 5' 5 (1.651 m).   Weight as of this encounter: 151 kg.   DVT prophylaxis: Lovenox  Code Status: Full code Family Communication: Disposition Plan:  Status is: Inpatient Remains inpatient appropriate because: Management of Resp failure, PNA    Consultants:  None  Procedures:  none Antimicrobials:    Subjective: Feeling well, she reports shortness of breath and worsening cough over the last couple of days.   Objective: Vitals:   04/20/24 0415 04/20/24 0500 04/20/24 0630 04/20/24 0637  BP:    102/60  Pulse: 80 79 72 82  Resp: 17 19 18 19   Temp:      TempSrc:      SpO2: 94% 92% 93% 93%  Weight:      Height:       No intake or output data in the 24 hours ending 04/20/24 0809 Filed Weights   04/20/24 0017  Weight: (!) 151 kg    Examination:  General exam: Appears calm and comfortable  Respiratory system: Bilateral rhonchus. Respiratory effort normal. Cardiovascular system: S1 & S2  heard, RRR.  Gastrointestinal system: Abdomen is nondistended, soft and nontender. No organomegaly or masses felt. Normal bowel sounds heard. Central nervous system: Alert and oriented. No focal neurological deficits. Extremities: Symmetric 5 x 5 power.   Data  Reviewed: I have personally reviewed following labs and imaging studies  CBC: Recent Labs  Lab 04/20/24 0157  WBC 12.1*  HGB 11.2*  HCT 37.4  MCV 103.3*  PLT 253   Basic Metabolic Panel: Recent Labs  Lab 04/20/24 0157  NA 140  K 4.9  CL 96*  CO2 35*  GLUCOSE 153*  BUN 20  CREATININE 1.10*  CALCIUM  7.9*  MG 1.9  PHOS 4.2   GFR: Estimated Creatinine Clearance: 77.2 mL/min (A) (by C-G formula based on SCr of 1.1 mg/dL (H)). Liver Function Tests: Recent Labs  Lab 04/20/24 0157  AST 29  ALT 24  ALKPHOS 56  BILITOT 0.2  PROT 7.2  ALBUMIN  4.2   No results for input(s): LIPASE, AMYLASE in the last 168 hours. No results for input(s): AMMONIA  in the last 168 hours. Coagulation Profile: No results for input(s): INR, PROTIME in the last 168 hours. Cardiac Enzymes: No results for input(s): CKTOTAL, CKMB, CKMBINDEX, TROPONINI in the last 168 hours. BNP (last 3 results) Recent Labs    01/16/24 2043 03/31/24 2042 04/20/24 0157  PROBNP 183.0 183.0 257.0   HbA1C: No results for input(s): HGBA1C in the last 72 hours. CBG: Recent Labs  Lab 04/20/24 0739  GLUCAP 209*   Lipid Profile: No results for input(s): CHOL, HDL, LDLCALC, TRIG, CHOLHDL, LDLDIRECT in the last 72 hours. Thyroid  Function Tests: No results for input(s): TSH, T4TOTAL, FREET4, T3FREE, THYROIDAB in the last 72 hours. Anemia Panel: No results for input(s): VITAMINB12, FOLATE, FERRITIN, TIBC, IRON, RETICCTPCT in the last 72 hours. Sepsis Labs: Recent Labs  Lab 04/20/24 0157 04/20/24 0417 04/20/24 0625  PROCALCITON <0.10  --   --   LATICACIDVEN  --  2.3* 1.4    Recent Results (from the past 240 hours)  Resp panel by RT-PCR (RSV, Flu A&B, Covid) Anterior Nasal Swab     Status: None   Collection Time: 04/20/24  1:20 AM   Specimen: Anterior Nasal Swab  Result Value Ref Range Status   SARS Coronavirus 2 by RT PCR NEGATIVE NEGATIVE Final    Influenza A by PCR NEGATIVE NEGATIVE Final   Influenza B by PCR NEGATIVE NEGATIVE Final    Comment: (NOTE) The Xpert Xpress SARS-CoV-2/FLU/RSV plus assay is intended as an aid in the diagnosis of influenza from Nasopharyngeal swab specimens and should not be used as a sole basis for treatment. Nasal washings and aspirates are unacceptable for Xpert Xpress SARS-CoV-2/FLU/RSV testing.  Fact Sheet for Patients: bloggercourse.com  Fact Sheet for Healthcare Providers: seriousbroker.it  This test is not yet approved or cleared by the [place]  FDA and has been authorized for detection and/or diagnosis of SARS-CoV-2 by FDA under an Emergency Use Authorization (EUA). This EUA will remain in effect (meaning this test can be used) for the duration of the COVID-19 declaration under Section 564(b)(1) of the Act, 21 U.S.C. section 360bbb-3(b)(1), unless the authorization is terminated or revoked.     Resp Syncytial Virus by PCR NEGATIVE NEGATIVE Final    Comment: (NOTE) Fact Sheet for Patients: bloggercourse.com  Fact Sheet for Healthcare Providers: seriousbroker.it  This test is not yet approved or cleared by the [place]  FDA and has been authorized for detection and/or diagnosis of SARS-CoV-2 by FDA under an Emergency Use  Authorization (EUA). This EUA will remain in effect (meaning this test can be used) for the duration of the COVID-19 declaration under Section 564(b)(1) of the Act, 21 U.S.C. section 360bbb-3(b)(1), unless the authorization is terminated or revoked.  Performed at [facility], [address], [place], [place] [address]          Radiology Studies: DG Foot 2 Views Left Result Date: 04/20/2024 EXAM: 2 VIEW(S) XRAY OF THE LEFT FOOT 04/20/2024 01:49:38 AM COMPARISON: None available. CLINICAL HISTORY: sob FINDINGS: BONES AND JOINTS: No acute fracture. No  malalignment. Plantar calcaneal spur. Degenerative changes of the distal interphalangeal joint of the third toe. Degenerative changes of the tarsometatarsal joints. SOFT TISSUES: Dorsal forefoot subcutaneous soft tissue edema. IMPRESSION: 1. Dorsal forefoot subcutaneous soft tissue edema. 2. Third ray and midfoot degenerative changes Electronically signed by: Dorethia Molt MD MD 04/20/2024 01:57 AM EST RP Workstation: [id number]   DG Chest Port 1 View Result Date: 04/20/2024 EXAM: 1 VIEW(S) XRAY OF THE CHEST 04/20/2024 01:49:38 AM COMPARISON: 03/31/2024 CLINICAL HISTORY: sob FINDINGS: LUNGS AND PLEURA: Low lung volumes. Progressive consolidation within the right mid lung zone. Grossly stable 8 mm pulmonary nodule within the peripheral right lung base, noted on CT examination of 03/23/2024. No pleural effusion. No pneumothorax. HEART AND MEDIASTINUM: Cardiomegaly. Aortic atherosclerosis. BONES AND SOFT TISSUES: No acute osseous abnormality. IMPRESSION: 1. Progressive consolidation within the right mid lung zone. 2. Follow-up chest radiograph is recommended in 3-4 weeks, following conservative therapy, to document resolution. If persistent at that time, dedicated contrast enhanced CT imaging of the chest is recommended for further evaluation. 3. Stable 8 mm pulmonary nodule within the peripheral right lung base. 4. Cardiomegaly Electronically signed by: Dorethia Molt MD MD 04/20/2024 01:54 AM EST RP Workstation: [id number]        Scheduled Meds:  budesonide   0.25 mg Nebulization BID   fenofibrate   54 mg Oral Daily   guaiFENesin   600 mg Oral BID   insulin  aspart  0-5 Units Subcutaneous QHS   insulin  aspart  0-9 Units Subcutaneous TID WC   levothyroxine   125 mcg Oral QHS   loratadine   10 mg Oral QHS   rosuvastatin   10 mg Oral QPM   umeclidinium-vilanterol  1 puff Inhalation Daily   Continuous Infusions:  ceFEPime  (MAXIPIME ) IV     vancomycin        LOS: 0 days    Time spent: 35  Minutes    Rosario Duey A Everhett Bozard, MD [facility]   If 7PM-7AM, please contact night-coverage [email or web address]  04/20/2024, 8:09 AM   "

## 2024-04-20 NOTE — ED Notes (Signed)
 Xray at bedside

## 2024-04-20 NOTE — Progress Notes (Signed)
 Pharmacy Antibiotic Note  Michele Owens is a 64 y.o. female admitted on 04/20/2024 with pneumonia.  Pharmacy has been consulted for Vancomycin  dosing. WBC is mildly elevated. Scr 1.1.   Plan: Vancomycin  1750 mg IV q24h >>>Estimated AUC: 539 Cefepime  per MD Trend WBC, temp, renal function  F/U infectious work-up Drug levels as indicated   Height: 5' 5 (165.1 cm) Weight: (!) 151 kg (333 lb) IBW/kg (Calculated) : 57  Temp (24hrs), Avg:98.4 F (36.9 C), Min:98.4 F (36.9 C), Max:98.4 F (36.9 C)  Recent Labs  Lab 04/20/24 0157 04/20/24 0417  WBC 12.1*  --   CREATININE 1.10*  --   LATICACIDVEN  --  2.3*    Estimated Creatinine Clearance: 77.2 mL/min (A) (by C-G formula based on SCr of 1.1 mg/dL (H)).    Allergies[1]  Lynwood Mckusick, PharmD, BCPS Clinical Pharmacist Phone: [phone]     [1]  Allergies Allergen Reactions   Bee Venom Anaphylaxis    All over my body (swelling)   Fioricet  [Butalbital -Apap-Caffeine ] Nausea And Vomiting and Rash   Ibuprofen  Rash and Other (See Comments)    Severe rash   Lamisil  [Terbinafine ] Rash and Other (See Comments)    Pt states this causes her to feel funny   Nsaids Other (See Comments)    Per MD's orders

## 2024-04-20 NOTE — ED Provider Notes (Signed)
 " [place] EMERGENCY DEPARTMENT AT [facility] Provider Note   CSN: [id number] Arrival date & time: 04/20/24  0009     Patient presents with: Leg Pain   Michele Owens is a 64 y.o. female.   The history is provided by the patient.  Patient with extensive history of multiple ER evaluations, patient with history of chronic CHF, COPD on oxygen , diabetes, hypertension, morbid obesity presents with multiple complaints  Patient reports for the past several days when she stands up she becomes lightheaded.  No syncope or falls or trauma She denies any chest pain but reports shortness of breath and cough, she is concerned she has pneumonia.  She also reports swelling to both of her legs and pain on the top of her left foot.  Denies any falls or trauma.    Past Medical History:  Diagnosis Date   Anxiety    Arthritis    Asthma    Chronic diastolic CHF (congestive heart failure) (HCC)    COPD (chronic obstructive pulmonary disease) (HCC)    Uses Oxygen  at night   Depression    Diabetes mellitus without complication (HCC)    GERD (gastroesophageal reflux disease)    Gout    Headache    migraines   History of DVT of lower extremity    Hypertension    Morbid obesity with BMI of 45.0-49.9, adult (HCC)    Thyroid  cancer (HCC) 09/23/2016    Prior to Admission medications  Medication Sig Start Date End Date Taking? Authorizing Provider  ACCU-CHEK GUIDE TEST test strip USE TO CHECK BLOOD SUGAR 3 TIMES DAILY 03/04/24   Webb, Padonda B, FNP  Accu-Chek Softclix Lancets lancets Use to check blood sugar 3 times daily 12/26/23   [name], MD  acetaminophen  (TYLENOL ) 325 MG tablet Take 2 tablets (650 mg total) by mouth every 6 (six) hours as needed for mild pain (pain score 1-3) or fever (or Fever >/= 101). 01/23/24   Arrien, Elidia Sieving, MD  albuterol  (PROVENTIL ) (2.5 MG/3ML) 0.083% nebulizer solution Take 3 mLs (2.5 mg total) by nebulization every 6 (six) hours as needed  for wheezing or shortness of breath. 03/13/24   Donnajean Lynwood DEL, PA-C  albuterol  (VENTOLIN  HFA) 108 (90 Base) MCG/ACT inhaler Inhale 1-2 puffs into the lungs every 6 (six) hours as needed for wheezing or shortness of breath. 12/26/23   [name], MD  allopurinol  (ZYLOPRIM ) 100 MG tablet Take 1 tablet (100 mg total) by mouth daily. 11/13/23 11/07/24  [name], MD  aspirin  EC 81 MG tablet Take 1 tablet (81 mg total) by mouth daily. Swallow whole. 11/13/23 11/07/24  [name], MD  blood glucose meter kit and supplies KIT Dispense based on patient and insurance preference. Use up to four times daily as directed. 12/25/20   Lue Elsie BROCKS, MD  Blood Glucose Monitoring Suppl DEVI 1 each by Does not apply route in the morning, at noon, and at bedtime. May substitute to any manufacturer covered by patient's insurance. 11/13/23   [name], MD  budesonide  (PULMICORT ) 0.25 MG/2ML nebulizer solution Inhale 2 mLs (0.25 mg total) by nebulization 2 (two) times daily. 11/13/23   [name], MD  busPIRone  (BUSPAR ) 30 MG tablet Take 1 tablet (30 mg total) by mouth 2 (two) times daily. 12/05/23 11/29/24  [name], MD  butalbital -acetaminophen -caffeine  (FIORICET ) 50-325-40 MG tablet Take 1 tablet by mouth every 6 (six) hours as needed for headache. 04/15/24  Thedora Garnette HERO, MD  carvedilol  (COREG ) 6.25 MG tablet Take 1 tablet (6.25 mg total) by mouth 2 (two) times daily. 12/26/23   [name], MD  clotrimazole  (LOTRIMIN ) 1 % cream Apply topically 2 (two) times daily. 12/13/23   [name], MD  diclofenac  Sodium (VOLTAREN ) 1 % GEL Apply 4 g topically 4 (four) times daily. 12/15/23   Freddi Hamilton, MD  dicyclomine  (BENTYL ) 20 MG tablet Take 1 tablet (20 mg total) by mouth 2 (two) times daily. 04/15/24   Thedora Garnette HERO, MD  EPINEPHrine  0.3 mg/0.3 mL IJ SOAJ injection Inject 0.3 mg into the muscle once as needed for anaphylaxis.    [provider]  fenofibrate   (TRICOR ) 145 MG tablet Take 1 tablet (145 mg total) by mouth daily. 12/26/23   [name], MD  FEROSUL 325 (65 Fe) MG tablet Take 1 tablet (325 mg total) by mouth daily. 12/26/23   [name], MD  gabapentin  (NEURONTIN ) 100 MG capsule Take 1 capsule (100 mg total) by mouth 2 (two) times daily. 04/15/24   Thedora Garnette HERO, MD  HYDROcodone -acetaminophen  (NORCO/VICODIN) 5-325 MG tablet Take 1 tablet by mouth every 4 (four) hours as needed for severe pain (pain score 7-10). 03/25/24   Sigdel, Derryl, MD  ipratropium-albuterol  (DUONEB) 0.5-2.5 (3) MG/3ML SOLN Take 3 mLs by nebulization every 4 (four) hours as needed. 01/23/24   Arrien, Mauricio Daniel, MD  levocetirizine (XYZAL ) 5 MG tablet TAKE 1 TABLET (5 MG TOTAL) BY MOUTH AT BEDTIME. 12/17/23   [name], MD  levothyroxine  (SYNTHROID ) 125 MCG tablet TAKE 1 TABLET (125 MCG TOTAL) BY MOUTH AT BEDTIME. 03/20/24   [name], MD  lidocaine  (HM LIDOCAINE  PATCH) 4 % Place 1 patch onto the skin daily. Patient not taking: Reported on 03/23/2024 03/13/24   Donnajean Lynwood DEL, PA-C  [Paused] losartan  (COZAAR ) 25 MG tablet Take 1 tablet (25 mg total) by mouth daily. Wait to take this until your doctor or other care provider tells you to start again. 11/13/23   [name], MD  lurasidone  (LATUDA ) 20 MG TABS tablet Take 1 tablet (20 mg total) by mouth at bedtime. Take with a snack. This is for mood and sleep. 12/25/23 12/19/24  [name], MD  [Paused] metolazone  (ZAROXOLYN ) 2.5 MG tablet Take 1 tablet (2.5 mg total) by mouth as directed. TAKE ONE DOSE OF METOLAZONE  2.5MG  TODAY, AND TOMORROW WITH (2) TABLETS OF POTASSIUM WITH THIS Patient not taking: Reported on 03/23/2024 Wait to take this until your doctor or other care provider tells you to start again. 03/19/24   Hayes Beckey CROME, NP  nitroGLYCERIN  (NITROSTAT ) 0.4 MG SL tablet Place 1 tablet (0.4 mg total) under the tongue every 5 (five) minutes as needed for chest pain. 04/15/24    Thedora Garnette HERO, MD  omeprazole  (PRILOSEC) 20 MG capsule TAKE 2 CAPSULES (40 MG TOTAL) BY MOUTH DAILY (2AM) 03/20/24   [name], MD  ondansetron  (ZOFRAN -ODT) 4 MG disintegrating tablet Take 1 tablet (4 mg total) by mouth every 8 (eight) hours as needed for nausea or vomiting. 12/26/23   [name], MD  OXYGEN  Inhale 2 L into the lungs continuous.    [provider]  potassium chloride  (KLOR-CON  M) 10 MEQ tablet Take 2 tablets (20 mEq total) by mouth daily. 03/03/24   Rolan Ezra RAMAN, MD  Rimegepant Sulfate (NURTEC) 75 MG TBDP Take 1 tablet (75 mg total) by mouth every other day. Prevents and  Treats Migraine Headaches Patient not taking: Reported on 03/23/2024 12/25/23 12/19/24  [name], MD  rizatriptan  (MAXALT ) 10 MG tablet Take 1 tablet (10 mg total) by mouth as needed for migraine. May repeat in 2 hours if needed 04/01/24   Billy Knee, FNP  rosuvastatin  (CRESTOR ) 10 MG tablet Take 1 tablet (10 mg total) by mouth every evening. 11/13/23   [name], MD  [Paused] spironolactone  (ALDACTONE ) 25 MG tablet Take 0.5 tablets (12.5 mg total) by mouth daily. Wait to take this until your doctor or other care provider tells you to start again. 01/23/24   Arrien, Mauricio Daniel, MD  [Paused] torsemide  (DEMADEX ) 20 MG tablet Take 4 tablets (80 mg total) by mouth 2 (two) times daily. Wait to take this until your doctor or other care provider tells you to start again. 03/19/24   Hayes Beckey CROME, NP  umeclidinium-vilanterol (ANORO ELLIPTA ) 62.5-25 MCG/ACT AEPB Inhale 1 puff into the lungs daily. 01/02/24   [name], MD  Vitamin D , Ergocalciferol , (DRISDOL ) 1.25 MG (50000 UNIT) CAPS capsule Take 1 capsule (50,000 Units total) by mouth every 7 (seven) days. 11/13/23   [name], MD    Allergies: Bee venom, Fioricet  [butalbital -apap-caffeine ], Ibuprofen , Lamisil  [terbinafine ], and Nsaids    Review of Systems  Constitutional:  Negative for fever.   Respiratory:  Positive for cough and shortness of breath.   Cardiovascular:  Negative for chest pain.  Neurological:  Positive for light-headedness. Negative for syncope.    Updated Vital Signs BP 129/72   Pulse 80   Temp 98.4 F (36.9 C) (Oral)   Resp 19   Ht 1.651 m (5' 5)   Wt (!) 151 kg   SpO2 91%   BMI 55.41 kg/m   Physical Exam CONSTITUTIONAL: Chronically ill-appearing, no acute distress HEAD: Normocephalic/atraumatic EYES: EOMI/PERRL ENMT: Mucous membranes moist, no stridor or drooling NECK: supple no meningeal signs CV: S1/S2 noted, no murmurs/rubs/gallops noted LUNGS: Decreased breath sounds bilaterally, no acute distress, currently on 3 L nasal cannula ABDOMEN: soft, nontender, no rebound or guarding, bowel sounds noted throughout abdomen GU:no cva tenderness NEURO: Pt is awake/alert/appropriate, moves all extremitiesx4.  No facial droop.   EXTREMITIES: pulses normal/equal, full ROM Distal pulses equal and intact Symmetric pitting edema to bilateral lower extremities.  Tenderness noted to the dorsal aspect of the left foot, no deformities, no bruising, no erythema or crepitance SKIN: warm, color normal PSYCH: no abnormalities of mood noted, alert and oriented to situation  (all labs ordered are listed, but only abnormal results are displayed) Labs Reviewed  COMPREHENSIVE METABOLIC PANEL WITH GFR - Abnormal; Notable for the following components:      Result Value   Chloride 96 (*)    CO2 35 (*)    Glucose, Bld 153 (*)    Creatinine, Ser 1.10 (*)    Calcium  7.9 (*)    GFR, Estimated 56 (*)    All other components within normal limits  CBC - Abnormal; Notable for the following components:   WBC 12.1 (*)    RBC 3.62 (*)    Hemoglobin 11.2 (*)    MCV 103.3 (*)    MCHC 29.9 (*)    All other components within normal limits  RESP PANEL BY RT-PCR (RSV, FLU A&B, COVID)  [id number]  CULTURE, BLOOD (ROUTINE X 2)  CULTURE, BLOOD (ROUTINE X 2)  PRO BRAIN NATRIURETIC  PEPTIDE    EKG: EKG Interpretation Date/Time:  Sunday April 20 2024 01:39:59 EST Ventricular Rate:  78 PR Interval:  182 QRS Duration:  81 QT Interval:  405 QTC Calculation: 462 R Axis:   78  Text Interpretation: Sinus rhythm Low voltage, precordial leads Borderline T abnormalities, anterior leads No significant change since last tracing Confirmed by Midge Golas ([id number]) on 04/20/2024 2:18:17 AM  Radiology: [facility] Foot 2 Views Left Result Date: 04/20/2024 EXAM: 2 VIEW(S) XRAY OF THE LEFT FOOT 04/20/2024 01:49:38 AM COMPARISON: None available. CLINICAL HISTORY: sob FINDINGS: BONES AND JOINTS: No acute fracture. No malalignment. Plantar calcaneal spur. Degenerative changes of the distal interphalangeal joint of the third toe. Degenerative changes of the tarsometatarsal joints. SOFT TISSUES: Dorsal forefoot subcutaneous soft tissue edema. IMPRESSION: 1. Dorsal forefoot subcutaneous soft tissue edema. 2. Third ray and midfoot degenerative changes Electronically signed by: Dorethia Molt MD MD 04/20/2024 01:57 AM EST RP Workstation: [id number]   DG Chest Port 1 View Result Date: 04/20/2024 EXAM: 1 VIEW(S) XRAY OF THE CHEST 04/20/2024 01:49:38 AM COMPARISON: 03/31/2024 CLINICAL HISTORY: sob FINDINGS: LUNGS AND PLEURA: Low lung volumes. Progressive consolidation within the right mid lung zone. Grossly stable 8 mm pulmonary nodule within the peripheral right lung base, noted on CT examination of 03/23/2024. No pleural effusion. No pneumothorax. HEART AND MEDIASTINUM: Cardiomegaly. Aortic atherosclerosis. BONES AND SOFT TISSUES: No acute osseous abnormality. IMPRESSION: 1. Progressive consolidation within the right mid lung zone. 2. Follow-up chest radiograph is recommended in 3-4 weeks, following conservative therapy, to document resolution. If persistent at that time, dedicated contrast enhanced CT imaging of the chest is recommended for further evaluation. 3. Stable 8 mm pulmonary nodule within the  peripheral right lung base. 4. Cardiomegaly Electronically signed by: Dorethia Molt MD MD 04/20/2024 01:54 AM EST RP Workstation: [id number]     Procedures   Medications Ordered in the ED  ceFEPIme  (MAXIPIME ) 2 g in sodium chloride  0.9 % 100 mL IVPB (has no administration in time range)  HYDROcodone -acetaminophen  (NORCO/VICODIN) 5-325 MG per tablet 1 tablet (1 tablet Oral Given 04/20/24 0149)    Clinical Course as of 04/20/24 0330  Sun Apr 20, 2024  0302 Patient presented for multiple issues including lightheadedness upon standing, shortness of breath, increasing cough.  She also reports bilateral leg swelling left foot pain [DW]  0302 X-ray reveals continued and slight progression of right lung pneumonia Patient was already admitted for this last month and is taking outpatient antibiotics She reports over the past week she has had increasing lightheadedness upon standing, shortness of breath and increasing cough with productive sputum  She also had increased oxygen  requirement at home per EMS. It appears that the pneumonia may be worsening.  Reviewed imaging with radiology, they feel that is likely worsening.  She does not require a repeat CT scan at this time as she just had 1 last month [DW]  0329 Discussed the case with Dr. Marcene with [facility]  Plan to admit for recurrent pneumonia and will expand the workup. Blood cultures will be ordered prior antibiotics  [DW]    Clinical Course User Index [DW] Midge Golas, MD                                 Medical Decision Making Amount and/or Complexity of Data Reviewed Labs: ordered. Radiology: ordered.  Risk Prescription drug management. Decision regarding hospitalization.   This patient presents to the ED for concern of lightheadedness, shortness of breath, this involves an extensive number of treatment options, and is a complaint that carries with  it a high risk of complications and morbidity.  The differential diagnosis  includes but is not limited to Acute coronary syndrome, pneumonia, acute pulmonary edema, pneumothorax, acute anemia, pulmonary embolism  Comorbidities that complicate the patient evaluation: Patients presentation is complicated by their history of COPD, CHF  Social Determinants of Health: Patients multiple ER visits, poor health literacy  increases the complexity of managing their presentation  Additional history obtained: Records reviewed previous admission documents  Lab Tests: I Ordered, and personally interpreted labs.  The pertinent results include: leukocytosis  Imaging Studies ordered: I ordered imaging studies including X-ray chest  I independently visualized and interpreted imaging which showed pneumonia I agree with the radiologist interpretation  Cardiac Monitoring: The patient was maintained on a cardiac monitor.  I personally viewed and interpreted the cardiac monitor which showed an underlying rhythm of:  sinus rhythm  Medicines ordered and prescription drug management: I ordered medication including Vicodin and for pain Reevaluation of the patient after these medicines showed that the patient    improved   Critical Interventions:   admission and antibiotics  Consultations Obtained: I requested consultation with the admitting physician [facility]  and radiologist Dr Verdene, and discussed  findings as well as pertinent plan - they recommend: admit, expand antibiotics for likely worsening PNA  Reevaluation: After the interventions noted above, I reevaluated the patient and found that they have :improved  Complexity of problems addressed: Patients presentation is most consistent with  acute presentation with potential threat to life or bodily function  Disposition: After consideration of the diagnostic results and the patients response to treatment,  I feel that the patent would benefit from admission  .   Patient's main issue is increasing cough shortness of  breath found to have worsening pneumonia Patient has already been on antibiotics as an outpatient Patient also reports bilateral leg swelling though likely due to her underlying CHF.  Also reports left foot pain, no acute fracture or erythema.  Low suspicion for DVT     Final diagnoses:  Acute respiratory failure with hypoxia [facility])  Community acquired pneumonia of right middle lobe of lung    ED Discharge Orders     None          Midge Golas, MD 04/20/24 [phone]  "

## 2024-04-21 ENCOUNTER — Inpatient Hospital Stay (HOSPITAL_COMMUNITY)

## 2024-04-21 DIAGNOSIS — J189 Pneumonia, unspecified organism: Secondary | ICD-10-CM | POA: Diagnosis not present

## 2024-04-21 LAB — CBC
HCT: 35.3 % — ABNORMAL LOW (ref 36.0–46.0)
Hemoglobin: 10.5 g/dL — ABNORMAL LOW (ref 12.0–15.0)
MCH: 30.7 pg (ref 26.0–34.0)
MCHC: 29.7 g/dL — ABNORMAL LOW (ref 30.0–36.0)
MCV: 103.2 fL — ABNORMAL HIGH (ref 80.0–100.0)
Platelets: 227 K/uL (ref 150–400)
RBC: 3.42 MIL/uL — ABNORMAL LOW (ref 3.87–5.11)
RDW: 13.2 % (ref 11.5–15.5)
WBC: 13.6 K/uL — ABNORMAL HIGH (ref 4.0–10.5)
nRBC: 0 % (ref 0.0–0.2)

## 2024-04-21 LAB — GLUCOSE, CAPILLARY
Glucose-Capillary: 122 mg/dL — ABNORMAL HIGH (ref 70–99)
Glucose-Capillary: 176 mg/dL — ABNORMAL HIGH (ref 70–99)
Glucose-Capillary: 169 mg/dL — ABNORMAL HIGH (ref 70–99)
Glucose-Capillary: 167 mg/dL — ABNORMAL HIGH (ref 70–99)

## 2024-04-21 LAB — MYCOPLASMA PNEUMONIAE ANTIBODY, IGM: Mycoplasma pneumo IgM: <770 U/mL (ref 0–769)

## 2024-04-21 MED ORDER — TRAZODONE HCL 50 MG PO TABS
50.0000 mg | ORAL_TABLET | Freq: Every evening | ORAL | Status: DC | PRN
  Administered 2024-04-21 – 2024-04-24 (×4): 50 mg via ORAL
  Filled 2024-04-21 (×4): qty 1

## 2024-04-21 MED ORDER — LURASIDONE HCL 20 MG PO TABS
20.0000 mg | ORAL_TABLET | Freq: Every day | ORAL | Status: DC
  Administered 2024-04-21 – 2024-04-26 (×6): 20 mg via ORAL
  Filled 2024-04-21 (×6): qty 1

## 2024-04-21 MED ORDER — ALBUTEROL SULFATE HFA 108 (90 BASE) MCG/ACT IN AERS
1.0000 | INHALATION_SPRAY | RESPIRATORY_TRACT | Status: DC | PRN
  Filled 2024-04-21: qty 6.7

## 2024-04-21 MED ORDER — HYDROCODONE-ACETAMINOPHEN 5-325 MG PO TABS
1.0000 | ORAL_TABLET | Freq: Once | ORAL | Status: AC
  Administered 2024-04-21: 1 via ORAL
  Filled 2024-04-21: qty 1

## 2024-04-21 MED ORDER — HYDROCODONE-ACETAMINOPHEN 5-325 MG PO TABS
1.0000 | ORAL_TABLET | Freq: Three times a day (TID) | ORAL | Status: DC | PRN
  Administered 2024-04-21 – 2024-04-27 (×14): 1 via ORAL
  Filled 2024-04-21 (×14): qty 1

## 2024-04-21 MED ORDER — TORSEMIDE 20 MG PO TABS
40.0000 mg | ORAL_TABLET | Freq: Two times a day (BID) | ORAL | Status: DC
  Administered 2024-04-21 – 2024-04-24 (×7): 40 mg via ORAL
  Filled 2024-04-21 (×7): qty 2

## 2024-04-21 MED ORDER — ALBUTEROL SULFATE (2.5 MG/3ML) 0.083% IN NEBU: 2.5000 mg | INHALATION_SOLUTION | RESPIRATORY_TRACT | Status: DC | PRN

## 2024-04-21 NOTE — Procedures (Addendum)
 Modified Barium Swallow Study  Patient Details  Name: Michele Owens MRN: [id number] Date of Birth: 08/22/1960  Today's Date: 04/21/2024  Modified Barium Swallow completed.  Full report located under Chart Review in the Imaging Section.  History of Present Illness Michele Owens is a 64 y.o. female who is admitted to [facility] on 04/20/2024 with severe sepsis due to HCAP pneumonia complicated by acute on chronic hypoxic respiratory failure after presenting from home to [facility] ED complaining of shortness of breath. Pt with recent admissions for pna x2 in Decembers.  She reports additional cases of pna over the past year including summer time. CXR 1/11:  Progressive consolidation within the right mid lung zone. Pt with medical history significant for chronic diastolic heart failure, COPD, chronic Evoxac respiratory failure on 2 L continuous nasal cannula, type 2 diabetes mellitus.   Clinical Impression Pt presents with grossly normal swallow function.  There was trace, transient penetration of thin liquids only, which is [facility].  There was no aspiration of any consistencies trialed.  There was trace residue. Esophageal sweep could not be completed 2/2 use of C-Arm unit to complete this study.  There was no retention or backflow of contrast in cervical esophagus. Pt has no further ST needs.  SLP will sign off at this time. Please reconsult if there is a change in functional status.  Recommend continuing regular texture diet with thin liquids.  DIGEST Swallow Severity Rating*  Safety: 0  Efficiency: 0  Overall Pharyngeal Swallow Severity: 0 1: mild; 2: moderate; 3: severe; 4: profound  *The Dynamic Imaging Grade of Swallowing Toxicity is standardized for the head and neck cancer population, however, demonstrates promising clinical applications across populations to standardize the clinical rating of pharyngeal swallow safety and severity.   Factors that may increase risk of adverse  event in presence of aspiration Michele Owens 2021):    Swallow Evaluation Recommendations Recommendations: PO diet PO Diet Recommendation: Regular;Thin liquids (Level 0) Liquid Administration via: Cup;Straw Medication Administration: Whole meds with liquid Supervision: Patient able to self-feed Postural changes: Position pt fully upright for meals Oral care recommendations: Staff/trained caregiver to provide oral care      Michele FORBES Grippe, MA, CCC-SLP Acute Rehabilitation Services Office: [phone] 04/21/2024,11:27 AM

## 2024-04-21 NOTE — Evaluation (Signed)
 Clinical/Bedside Swallow Evaluation Patient Details  Name: Michele Owens MRN: [id number] Date of Birth: 1960/04/24  Today's Date: 04/21/2024 Time: SLP Start Time (ACUTE ONLY): 9170 SLP Stop Time (ACUTE ONLY): 0844 SLP Time Calculation (min) (ACUTE ONLY): 15 min  Past Medical History:  Past Medical History:  Diagnosis Date   Anxiety    Arthritis    Asthma    Chronic diastolic CHF (congestive heart failure) (HCC)    COPD (chronic obstructive pulmonary disease) (HCC)    Uses Oxygen  at night   Depression    Diabetes mellitus without complication (HCC)    GERD (gastroesophageal reflux disease)    Gout    Headache    migraines   History of DVT of lower extremity    Hypertension    Morbid obesity with BMI of 45.0-49.9, adult (HCC)    Thyroid  cancer (HCC) 09/23/2016   Past Surgical History:  Past Surgical History:  Procedure Laterality Date   BUNIONECTOMY Bilateral    CARDIAC CATHETERIZATION N/A 06/23/2015   Procedure: Right/Left Heart Cath and Coronary Angiography;  Surgeon: Ezra GORMAN Shuck, MD;  Location: [facility] INVASIVE CV LAB;  Service: Cardiovascular;  Laterality: N/A;   COLONOSCOPY WITH PROPOFOL  N/A 12/15/2015   Procedure: COLONOSCOPY WITH PROPOFOL ;  Surgeon: Norleen Hint, MD;  Location: [facility] ENDOSCOPY;  Service: Endoscopy;  Laterality: N/A;   RIGHT HEART CATH N/A 10/01/2019   Procedure: RIGHT HEART CATH;  Surgeon: Shuck Ezra GORMAN, MD;  Location: [facility] INVASIVE CV LAB;  Service: Cardiovascular;  Laterality: N/A;   RIGHT HEART CATH N/A 03/03/2024   Procedure: RIGHT HEART CATH;  Surgeon: Shuck Ezra GORMAN, MD;  Location: [facility] INVASIVE CV LAB;  Service: Cardiovascular;  Laterality: N/A;   THYROIDECTOMY  09/19/2016   THYROIDECTOMY N/A 09/19/2016   Procedure: TOTAL THYROIDECTOMY;  Surgeon: Eletha Boas, MD;  Location: [facility] OR;  Service: General;  Laterality: N/A;   TONSILLECTOMY     TOTAL ABDOMINAL HYSTERECTOMY  07/14/10   HPI:  Michele Owens is a 64 y.o. female who is admitted to [facility] on 04/20/2024 with severe sepsis due to HCAP pneumonia complicated by acute on chronic hypoxic respiratory failure after presenting from home to [facility] ED complaining of shortness of breath. Pt with recent admissions for pna x2 in Decembers.  She reports additional cases of pna over the past year including summer time. CXR 1/11:  Progressive consolidation within the right mid lung zone. Pt with medical history significant for chronic diastolic heart failure, COPD, chronic Evoxac respiratory failure on 2 L continuous nasal cannula, type 2 diabetes mellitus.    Assessment / Plan / Recommendation  Clinical Impression  Pt exhibited good tolerance of all consistencies trialed today with no clinical s/s of aspiration; however, a concern for prandial aspiration still exists given persistent recurrent pna.  Pt refuses FEES.  Spoke with hospitalist who shares concern for silent aspiration.  Will attempt to coordinate a MBSS by C-Arm given BMI.  SLP to schedule instrumental study as rad/OR/SLP schedules permit.  Pt may continue current diet pending instrumental evaluation.    Recommend regular texture diet with thin liquid.   SLP Visit Diagnosis: Dysphagia, unspecified (R13.10)    Aspiration Risk  Mild aspiration risk    Diet Recommendation Regular;Thin liquid    Liquid Administration via: Cup;Straw Medication Administration: Whole meds with liquid Supervision: Patient able to self feed Compensations: Slow rate;Small sips/bites Postural Changes: Seated upright at 90 degrees    Other Recommendations Oral Care Recommendations: Oral care BID  Swallow Evaluation Recommendations  See above   Assistance Recommended at Discharge  N/A  Functional Status Assessment  (TBD)  Frequency and Duration  (TBD)          Prognosis Prognosis for improved oropharyngeal function:  (TBD)      Swallow Study   General Date of Onset: 04/20/24 HPI: Michele Owens is a 64 y.o. female who is  admitted to [facility] on 04/20/2024 with severe sepsis due to HCAP pneumonia complicated by acute on chronic hypoxic respiratory failure after presenting from home to [facility] ED complaining of shortness of breath. Pt with recent admissions for pna x2 in Decembers.  She reports additional cases of pna over the past year including summer time. CXR 1/11:  Progressive consolidation within the right mid lung zone. Pt with medical history significant for chronic diastolic heart failure, COPD, chronic Evoxac respiratory failure on 2 L continuous nasal cannula, type 2 diabetes mellitus. Type of Study: Bedside Swallow Evaluation Previous Swallow Assessment: none Diet Prior to this Study: Regular;Thin liquids (Level 0) Temperature Spikes Noted: No Respiratory Status: Nasal cannula History of Recent Intubation: No Behavior/Cognition: Alert;Cooperative Oral Cavity Assessment: Within Functional Limits Oral Care Completed by SLP: No Oral Cavity - Dentition: Adequate natural dentition;Missing dentition (upper partial at home) Vision: Functional for self-feeding Self-Feeding Abilities: Able to feed self Patient Positioning: Upright in bed Baseline Vocal Quality:  (Harsh) Volitional Cough: Strong Volitional Swallow: Able to elicit    Oral/Motor/Sensory Function Overall Oral Motor/Sensory Function: Within functional limits   Ice Chips Ice chips: Not tested   Thin Liquid Thin Liquid: Within functional limits    Nectar Thick Nectar Thick Liquid: Not tested   Honey Thick Honey Thick Liquid: Not tested   Puree Puree: Within functional limits Presentation: Self Fed;Spoon   Solid     Solid: Within functional limits Presentation: Self Fed      Anette FORBES Grippe, MA, CCC-SLP Acute Rehabilitation Services Office: [phone] 04/21/2024,9:17 AM

## 2024-04-21 NOTE — Progress Notes (Signed)
 Received request for consultation.  For recurrent pneumonia or slowly resolve pneumonia.  To better evaluate her parenchyma given habitus and difficulty with visualization on chest x-ray, CT chest without contrast was ordered.  It appears patient refused this today.  Formal consultation will follow after CT chest is obtained so that we can comment as accurately as possible with new information.  No change to the current plan as outlined in hospitalist note.

## 2024-04-21 NOTE — Progress Notes (Signed)
 Heart Failure Navigator Progress Note  Assessed for Heart & Vascular TOC clinic readiness.  Patient does not meet criteria due to she is an Advanced Heart Failure team patient of Dr. Rolan. .   Navigator will sign off at this time.   Stephane Haddock, BSN, Scientist, Clinical (histocompatibility And Immunogenetics) Only

## 2024-04-21 NOTE — Progress Notes (Signed)
 Mobility Specialist Progress Note;   04/21/24 1530  Mobility  Activity Pivoted/transferred to/from [facility]  Level of Assistance Contact guard assist, steadying assist  Assistive Device None  Distance Ambulated (ft) 3 ft  Activity Response Tolerated fair  Mobility Referral Yes  Mobility visit 1 Mobility  Mobility Specialist Start Time (ACUTE ONLY) 1530  Mobility Specialist Stop Time (ACUTE ONLY) 1542  Mobility Specialist Time Calculation (min) (ACUTE ONLY) 12 min   Pt requesting assistance from [facility]. Required MinG assistance for all mobility and to assist w/ pericare as pt had a BM. VSS throughout. Pt returned back to bed and left comfortably with all needs met, alarm on.   Lauraine Erm Mobility Specialist Please contact via SecureChat or [employer] [phone]

## 2024-04-21 NOTE — Progress Notes (Signed)
 RT called for pt request for PRN nebulizer treatment. Upon arrival to room, pt immediately stating that she does not want it that she wants to eat. Pt encouraged to take pt as she was requesting one, but pt continues to endorse that she wants to eat. Pt in no acute distress at this time. HR 86, RR 20, SpO2 95% on 3L nasal cannula. RT will continue to be available as needed.

## 2024-04-21 NOTE — Evaluation (Signed)
 Occupational Therapy Evaluation Patient Details Name: Michele Owens MRN: [id number] DOB: 08-03-1960 Today's Date: 04/21/2024   History of Present Illness   64 yo F adm 12/14 CP and generalized weakness. Pt with workup for CAP and  AKI PMH obesity, HTN, OSA, COPD, hypoxic RF on 2L [place], CHF, gout, bipolar, SVT, migraines GERD, hx of DVT, anxiety depression   HFpEF, asthma,tobacco use, HLD, pulmonary edema, thyroid  CA, anasarca     Clinical Impressions Pt presents with decline in function and safety with ADLs and ADL mobility with impaired balance and endurance. Pt reports that PTA she lives alone and was Ind with UB ADLs, grooming and toileting, aide assists with bathiing, LB ADLs, socks and shoes. Pt Mod I with mobility. Pt reports that she does her own grocery shopping and cooking with transportation assist. Pt currently requires set up with UB ADLs and grooming seated EOB, CGA BSC SPTs, min A with toileting, assist with LB ADLs. OT will follow acutely to maximize level of function and safety. No f/u OT after d/c recommended at this time   If plan is discharge home, recommend the following:   A lot of help with bathing/dressing/bathroom;Assistance with cooking/housework;Assist for transportation;Help with stairs or ramp for entrance     Functional Status Assessment   Patient has had a recent decline in their functional status and demonstrates the ability to make significant improvements in function in a reasonable and predictable amount of time.     Equipment Recommendations   None recommended by OT     Recommendations for Other Services         Precautions/Restrictions   Restrictions Weight Bearing Restrictions Per Provider Order: No     Mobility Bed Mobility Overal bed mobility: Modified Independent             General bed mobility comments: no physical assist or difficulty    Transfers Overall transfer level: Needs assistance Equipment used: 1 person  hand held assist Transfers: Sit to/from Stand, Bed to chair/wheelchair/BSC Sit to Stand: Supervision Stand pivot transfers: Supervision                Balance Overall balance assessment: Mild deficits observed, not formally tested                                         ADL either performed or assessed with clinical judgement   ADL Overall ADL's : Needs assistance/impaired                                       General ADL Comments: set up with UB ADLs and grooming seated EOB, CGA BSC SPTs, min A with toileting anterior. Assist with LB ADLs at baseline     Vision Baseline Vision/History: 1 Wears glasses Ability to See in Adequate Light: 0 Adequate Patient Visual Report: No change from baseline       Perception         Praxis         Pertinent Vitals/Pain Pain Assessment Pain Assessment: No/denies pain     Extremity/Trunk Assessment Upper Extremity Assessment Upper Extremity Assessment: Overall WFL for tasks assessed;Right hand dominant   Lower Extremity Assessment Lower Extremity Assessment: Defer to PT evaluation       Communication Communication Communication: No apparent difficulties  Cognition Arousal: Alert Behavior During Therapy: WFL for tasks assessed/performed, Flat affect Cognition: No apparent impairments                               Following commands: Intact       Cueing  General Comments   Cueing Techniques: Verbal cues      Exercises     Shoulder Instructions      Home Living   Living Arrangements: Alone                               Additional Comments: has an aide M-F for 2 hours; wears 2L O2 baseline, reports turning it up to 5L when SOB but does not regularly check SPO2 at these times      Prior Functioning/Environment Prior Level of Function : Needs assist             Mobility Comments: Mod I ADLs Comments: Ind with UB ADLs, grooming and  toileting, aide assists with bathiing, LB ADLs, socks and shoes. Pt reports that she does her own grocery shopping and cooking with transportation assist    OT Problem List: Impaired balance (sitting and/or standing);Decreased activity tolerance   OT Treatment/Interventions: Self-care/ADL training;Patient/family education;Therapeutic activities      OT Goals(Current goals can be found in the care plan section)   Acute Rehab OT Goals Patient Stated Goal: go home OT Goal Formulation: With patient Time For Goal Achievement: 05/05/24 Potential to Achieve Goals: Good ADL Goals Pt Will Transfer to Toilet: with supervision;with modified independence;bedside commode Pt Will Perform Toileting - Clothing Manipulation and hygiene: with contact guard assist;with supervision;sitting/lateral leans;sit to/from stand   OT Frequency:  Min 1X/week    Co-evaluation              AM-PAC OT 6 Clicks Daily Activity     Outcome Measure Help from another person eating meals?: None Help from another person taking care of personal grooming?: None Help from another person toileting, which includes using toliet, bedpan, or urinal?: A Little Help from another person bathing (including washing, rinsing, drying)?: A Lot Help from another person to put on and taking off regular upper body clothing?: A Little Help from another person to put on and taking off regular lower body clothing?: A Lot 6 Click Score: 18   End of Session Equipment Utilized During Treatment: Other (comment) (BSC) Nurse Communication: Mobility status  Activity Tolerance: Patient tolerated treatment well Patient left: in bed  OT Visit Diagnosis: Other abnormalities of gait and mobility (R26.89)                Time: 8557-8496 OT Time Calculation (min): 21 min Charges:  OT General Charges $OT Visit: 1 Visit OT Evaluation $OT Eval Low Complexity: 1 Low   Jacques Karna Loose 04/21/2024, 3:45 PM

## 2024-04-21 NOTE — Progress Notes (Signed)
 " PROGRESS NOTE    Michele Owens  FMW:[id number] DOB: February 27, 1961 DOA: 04/20/2024 PCP: [name], MD   Brief Narrative: 64 year old with past medical history significant for chronic diastolic heart failure, COPD, chronic hypoxic respiratory failure on 2 L of oxygen , diabetes type 2 who presents with worsening shortness of breath found to have severe sepsis due to pneumonia complicated with acute on chronic hypoxic respiratory failure.  Of note patient had a recent hospitalization from 03/23/2024 to 03/25/2024 for pneumonia treated with antibiotics.  Presented again 12/26: Complaining of worsening shortness of breath at that time discharged on doxycycline  as an outpatient.  She presented with worsening shortness of breath over the last week associated with increased cough. Evaluation in the ED consistent with COVID RSV PCR negative, chest x-ray showed interval worsening right middle lobe infiltrate concerning for worsening pneumonia.   Assessment & Plan:   Principal Problem:   HCAP (healthcare-associated pneumonia) Active Problems:   DM2 (diabetes mellitus, type 2) (HCC)   Acquired hypothyroidism   HLD (hyperlipidemia)   Acute on chronic hypoxic respiratory failure (HCC)   Chronic diastolic CHF (congestive heart failure) (HCC)   Severe sepsis (HCC)   History of COPD   Allergic rhinitis  1-Severe sepsis due to healthcare associated pneumonia: PNA:  - Patient with worsening cough, shortness of breath.  Chest x-ray showed worsening right middle lobe infiltrate.  Lactic acid: - Follow blood cultures - Speech consultation to evaluate for aspiration pneumonia.  - Continue IV vancomycin  and cefepime  day 2 -Check a sputum culture -Mycoplasma pending, Legionella pending, strep pneumonia negative. -MRSA PCR pending -No aspiration on MBS and speech evaluation.  -Pulmonologist consulted due to recurrent PNA>  -CT chest has been ordered.   Acute on chronic hypoxic respiratory  failure - On chronic 2 L of oxygen  at home, currently requiring 3 L -Continue Anoro Ellipta , albuterol  as needed -Continue Pulmicort   Chronic diastolic heart failure - Diuretics were held during last hospitalization due to AKI.  This was supposed to be resumed by PCP on follow-up. - Will resume torsemide  today 40 mg BID.   History of COPD - Continue Anoro Ellipta , albuterol , Pulmicort   Diabetes type 2: - Continue sliding scale insulin   Hyperlipidemia: - Continue with fenofibrate  and Crestor   Acquired hypothyroidism - Continue Synthroid   Allergic rhinitis - Continue Claritin   Chronic pain: Resume gabapentin  Anxiety: Continue  BuSpar , latuda .   Estimated body mass index is 57.11 kg/m as calculated from the following:   Height as of this encounter: 5' 5 (1.651 m).   Weight as of this encounter: 155.7 kg.   DVT prophylaxis: Lovenox  Code Status: Full code Family Communication: Disposition Plan:  Status is: Inpatient Remains inpatient appropriate because: Management of Resp failure, PNA    Consultants:  None  Procedures:  none Antimicrobials:    Subjective: She is not feeling well today, she has not been able to sleep last night.  She has been able to produce phlegm, has not received cup for sputum collection.  She has asked for tissue paper , has not received.  Denies worsening dyspnea.    Objective: Vitals:   04/20/24 2043 04/21/24 0012 04/21/24 0342 04/21/24 0757  BP: (!) 143/81 (!) 135/59  (!) 107/58  Pulse: 88 79  81  Resp: 18 18 20 20   Temp: 99 F (37.2 C) 98 F (36.7 C) 98.4 F (36.9 C) 98.1 F (36.7 C)  TempSrc: Oral Oral Oral Oral  SpO2: 98% 98%  93%  Weight:   ROLLEN)  155.7 kg   Height:        Intake/Output Summary (Last 24 hours) at 04/21/2024 0758 Last data filed at 04/21/2024 0343 Gross per 24 hour  Intake 100 ml  Output 750 ml  Net -650 ml   Filed Weights   04/20/24 0017 04/21/24 0342  Weight: (!) 151 kg (!) 155.7 kg     Examination:  General exam: NAD, Respiratory system: Normal Resp effort, BL ronchus Cardiovascular system: S 1, S 2 RRR.  Gastrointestinal system: BS present, soft, nt Central nervous system: alert Extremities: Symmetric 5 x 5 power.   Data Reviewed: I have personally reviewed following labs and imaging studies  CBC: Recent Labs  Lab 04/20/24 0157  WBC 12.1*  HGB 11.2*  HCT 37.4  MCV 103.3*  PLT 253   Basic Metabolic Panel: Recent Labs  Lab 04/20/24 0157  NA 140  K 4.9  CL 96*  CO2 35*  GLUCOSE 153*  BUN 20  CREATININE 1.10*  CALCIUM  7.9*  MG 1.9  PHOS 4.2   GFR: Estimated Creatinine Clearance: 78.7 mL/min (A) (by C-G formula based on SCr of 1.1 mg/dL (H)). Liver Function Tests: Recent Labs  Lab 04/20/24 0157  AST 29  ALT 24  ALKPHOS 56  BILITOT 0.2  PROT 7.2  ALBUMIN  4.2   No results for input(s): LIPASE, AMYLASE in the last 168 hours. No results for input(s): AMMONIA  in the last 168 hours. Coagulation Profile: No results for input(s): INR, PROTIME in the last 168 hours. Cardiac Enzymes: No results for input(s): CKTOTAL, CKMB, CKMBINDEX, TROPONINI in the last 168 hours. BNP (last 3 results) Recent Labs    01/16/24 2043 03/31/24 2042 04/20/24 0157  PROBNP 183.0 183.0 257.0   HbA1C: No results for input(s): HGBA1C in the last 72 hours. CBG: Recent Labs  Lab 04/20/24 0739 04/20/24 1154 04/20/24 1743 04/20/24 2112  GLUCAP 209* 177* 117* 168*   Lipid Profile: No results for input(s): CHOL, HDL, LDLCALC, TRIG, CHOLHDL, LDLDIRECT in the last 72 hours. Thyroid  Function Tests: No results for input(s): TSH, T4TOTAL, FREET4, T3FREE, THYROIDAB in the last 72 hours. Anemia Panel: No results for input(s): VITAMINB12, FOLATE, FERRITIN, TIBC, IRON, RETICCTPCT in the last 72 hours. Sepsis Labs: Recent Labs  Lab 04/20/24 0157 04/20/24 0417 04/20/24 0625  PROCALCITON <0.10  --   --    LATICACIDVEN  --  2.3* 1.4    Recent Results (from the past 240 hours)  Resp panel by RT-PCR (RSV, Flu A&B, Covid) Anterior Nasal Swab     Status: None   Collection Time: 04/20/24  1:20 AM   Specimen: Anterior Nasal Swab  Result Value Ref Range Status   SARS Coronavirus 2 by RT PCR NEGATIVE NEGATIVE Final   Influenza A by PCR NEGATIVE NEGATIVE Final   Influenza B by PCR NEGATIVE NEGATIVE Final    Comment: (NOTE) The Xpert Xpress SARS-CoV-2/FLU/RSV plus assay is intended as an aid in the diagnosis of influenza from Nasopharyngeal swab specimens and should not be used as a sole basis for treatment. Nasal washings and aspirates are unacceptable for Xpert Xpress SARS-CoV-2/FLU/RSV testing.  Fact Sheet for Patients: bloggercourse.com  Fact Sheet for Healthcare Providers: seriousbroker.it  This test is not yet approved or cleared by the [place]  FDA and has been authorized for detection and/or diagnosis of SARS-CoV-2 by FDA under an Emergency Use Authorization (EUA). This EUA will remain in effect (meaning this test can be used) for the duration of the COVID-19 declaration under Section 564(b)(1) of  the Act, 21 U.S.C. section 360bbb-3(b)(1), unless the authorization is terminated or revoked.     Resp Syncytial Virus by PCR NEGATIVE NEGATIVE Final    Comment: (NOTE) Fact Sheet for Patients: bloggercourse.com  Fact Sheet for Healthcare Providers: seriousbroker.it  This test is not yet approved or cleared by the [place]  FDA and has been authorized for detection and/or diagnosis of SARS-CoV-2 by FDA under an Emergency Use Authorization (EUA). This EUA will remain in effect (meaning this test can be used) for the duration of the COVID-19 declaration under Section 564(b)(1) of the Act, 21 U.S.C. section 360bbb-3(b)(1), unless the authorization is terminated  or revoked.  Performed at [facility], [address], [place], [place] [address]   Blood culture (routine x 2)     Status: None (Preliminary result)   Collection Time: 04/20/24  4:05 AM   Specimen: BLOOD  Result Value Ref Range Status   Specimen Description BLOOD BLOOD LEFT ARM  Final   Special Requests   Final    BOTTLES DRAWN AEROBIC AND ANAEROBIC Blood Culture results may not be optimal due to an inadequate volume of blood received in culture bottles   Culture   Final    NO GROWTH < 12 HOURS Performed at [facility], [address] [place], [place] [address]    Report Status PENDING  Incomplete  Blood culture (routine x 2)     Status: None (Preliminary result)   Collection Time: 04/20/24  4:12 AM   Specimen: BLOOD LEFT ARM  Result Value Ref Range Status   Specimen Description BLOOD LEFT ARM  Final   Special Requests   Final    BOTTLES DRAWN AEROBIC AND ANAEROBIC Blood Culture results may not be optimal due to an inadequate volume of blood received in culture bottles   Culture   Final    NO GROWTH < 12 HOURS Performed at [facility], [address], [place], [place] [address]    Report Status PENDING  Incomplete         Radiology Studies: DG Foot 2 Views Left Result Date: 04/20/2024 EXAM: 2 VIEW(S) XRAY OF THE LEFT FOOT 04/20/2024 01:49:38 AM COMPARISON: None available. CLINICAL HISTORY: sob FINDINGS: BONES AND JOINTS: No acute fracture. No malalignment. Plantar calcaneal spur. Degenerative changes of the distal interphalangeal joint of the third toe. Degenerative changes of the tarsometatarsal joints. SOFT TISSUES: Dorsal forefoot subcutaneous soft tissue edema. IMPRESSION: 1. Dorsal forefoot subcutaneous soft tissue edema. 2. Third ray and midfoot degenerative changes Electronically signed by: Dorethia Molt MD MD 04/20/2024 01:57 AM EST RP Workstation: [id number]   DG Chest Port 1 View Result Date: 04/20/2024 EXAM: 1 VIEW(S) XRAY OF THE CHEST  04/20/2024 01:49:38 AM COMPARISON: 03/31/2024 CLINICAL HISTORY: sob FINDINGS: LUNGS AND PLEURA: Low lung volumes. Progressive consolidation within the right mid lung zone. Grossly stable 8 mm pulmonary nodule within the peripheral right lung base, noted on CT examination of 03/23/2024. No pleural effusion. No pneumothorax. HEART AND MEDIASTINUM: Cardiomegaly. Aortic atherosclerosis. BONES AND SOFT TISSUES: No acute osseous abnormality. IMPRESSION: 1. Progressive consolidation within the right mid lung zone. 2. Follow-up chest radiograph is recommended in 3-4 weeks, following conservative therapy, to document resolution. If persistent at that time, dedicated contrast enhanced CT imaging of the chest is recommended for further evaluation. 3. Stable 8 mm pulmonary nodule within the peripheral right lung base. 4. Cardiomegaly Electronically signed by: Dorethia Molt MD MD 04/20/2024 01:54 AM EST RP Workstation: [id number]  Scheduled Meds:  budesonide   0.25 mg Nebulization BID   busPIRone   30 mg Oral BID   enoxaparin  (LOVENOX ) injection  70 mg Subcutaneous Q24H   fenofibrate   54 mg Oral Daily   gabapentin   100 mg Oral BID   guaiFENesin   1,200 mg Oral BID   insulin  aspart  0-5 Units Subcutaneous QHS   insulin  aspart  0-9 Units Subcutaneous TID WC   levothyroxine   125 mcg Oral QHS   loratadine   10 mg Oral QHS   rosuvastatin   10 mg Oral QPM   umeclidinium-vilanterol  1 puff Inhalation Daily   Continuous Infusions:  ceFEPime  (MAXIPIME ) IV 2 g (04/21/24 0540)   vancomycin  1,750 mg (04/20/24 2151)     LOS: 1 day    Time spent: 35 Minutes    Rebakah Cokley A Mahad Newstrom, MD [facility]   If 7PM-7AM, please contact night-coverage [email or web address]  04/21/2024, 7:58 AM   "

## 2024-04-22 ENCOUNTER — Inpatient Hospital Stay (HOSPITAL_COMMUNITY)

## 2024-04-22 DIAGNOSIS — J189 Pneumonia, unspecified organism: Secondary | ICD-10-CM | POA: Diagnosis not present

## 2024-04-22 LAB — BASIC METABOLIC PANEL WITH GFR
Anion gap: 8 (ref 5–15)
BUN: 12 mg/dL (ref 8–23)
CO2: 40 mmol/L — ABNORMAL HIGH (ref 22–32)
Calcium: 8.6 mg/dL — ABNORMAL LOW (ref 8.9–10.3)
Chloride: 92 mmol/L — ABNORMAL LOW (ref 98–111)
Creatinine, Ser: 0.71 mg/dL (ref 0.44–1.00)
GFR, Estimated: >60 mL/min
Glucose, Bld: 132 mg/dL — ABNORMAL HIGH (ref 70–99)
Potassium: 3.9 mmol/L (ref 3.5–5.1)
Sodium: 141 mmol/L (ref 135–145)

## 2024-04-22 LAB — LEGIONELLA PNEUMOPHILA SEROGP 1 UR AG: L. pneumophila Serogp 1 Ur Ag: NEGATIVE

## 2024-04-22 LAB — GLUCOSE, CAPILLARY
Glucose-Capillary: 188 mg/dL — ABNORMAL HIGH (ref 70–99)
Glucose-Capillary: 216 mg/dL — ABNORMAL HIGH (ref 70–99)
Glucose-Capillary: 146 mg/dL — ABNORMAL HIGH (ref 70–99)
Glucose-Capillary: 127 mg/dL — ABNORMAL HIGH (ref 70–99)

## 2024-04-22 LAB — MRSA NEXT GEN BY PCR, NASAL: MRSA by PCR Next Gen: NOT DETECTED

## 2024-04-22 MED ORDER — VITAMIN D (ERGOCALCIFEROL) 1.25 MG (50000 UNIT) PO CAPS
50000.0000 [IU] | ORAL_CAPSULE | ORAL | Status: DC
  Administered 2024-04-22: 50000 [IU] via ORAL
  Filled 2024-04-22: qty 1

## 2024-04-22 MED ORDER — VANCOMYCIN HCL 2000 MG/400ML IV SOLN
2000.0000 mg | INTRAVENOUS | Status: DC
  Administered 2024-04-22 – 2024-04-23 (×2): 2000 mg via INTRAVENOUS
  Filled 2024-04-22 (×2): qty 400

## 2024-04-22 NOTE — Discharge Instructions (Addendum)
 Transportation Resources: Managed Medicare Transportation:  If you have at [employer] plan (UHC/BCBS/Humana etc) please call Member Services on the back of your insurance card to assess potential non-emergency ride benefits.   Medicaid Transportation:  Based on county of residence. If you have Medicaid, you can schedule transportation to medical appointments using the following numbers based off which insurance manages your Medicaid plan: UnitedHealthcare- [phone] [facility]- [phone] [employer]- [phone] [facility] Complete Health- [phone] [facility]- [phone] If you have secondary or non-managed Medicaid (Medicaid of [place]) call your local Dept of Social Services to discuss transport  Access [place] (formerly [place]): [employer] #: [phone] Eligibility assessment and application required. For those with disabilities- wheelchair accessible, cash fare $4 for round trip  Senior Wheels: [employer], [place], and [employer] #: [place] and [phone] Phone #: Everywhere else in [place]- [phone] Provides rides to medical appointments in [facility], based on availability of drivers. Must be 55 and over and unable to drive, limit to one ride per week  Access [place]: [employer] Phone #: [phone] Application required. Provides rides for those 70 and over as well as disabled individuals in Operating Room Services Operate M-F 5:45am-6:30pm, Saturday 8:45am-5:15pm, costs $2.50 per one-way trip     [facility] and [employer] (TAMS): [facility] Phone# [phone]  Application required. Is available for those under 60 and without Medicaid but may include a charge  [employer]- Discount Identification Card: [place] Phone #: [phone]  Half-price fares if riders meet one or more of the following: are a veteran, senior citizen (60 years or older), are a consulting civil engineer (6-18 years),  receive Medicaid/Medicare, or have a disability. Documentation of either of these is required to a receive card, which must be shown to a driver at boarding each time.   [employer] [place] Phone #: [phone]  Anyone requiring transportation in [place] is eligible to ride the [employer]. [employer] provides transportation for general purpose trips, medical trips, and almost any non-emergency trip destination. In addition, special programs and pricing are available to qualified riders based on eligibility requirements.  [facility]: [facility] Phone #: [phone]  Serves those who are disabled without Medicaid but may have small fee  RCATS: [facility] Phone #: [phone]  Serves those who are disabled without Medicaid but may have small fee. Will provide transportation to [place] during limited times and for $10 round trip.   Shelter/Housing Resources  [employer] [facility] [employer] [place] [facility] [employer] Temporary Emergency Shelter and Radiographer, Therapeutic for individuals and families escaping domestic  violence. Contact Information Phone: [phone] Email: information@fspcares .org Location Confidential Location in [place], [place]. Eligibility Services are available to individuals and families fleeing domestic violence situations. Hours of Operation 24/7 Crisis Support  Cost/Fees None Referral Appointment/Walk-in. Call for intake and eligibility screening [facility] Temporary Emergency Shelter, Case Management, Job Readiness Support, and Housing Assistance Contact Information Phone: [phone] Location [address] [place], [place], [place] [address] Eligibility Families with children experiencing homelessness. No single individuals. Hours of Operation 24/7 for Residents. Office Hours Monday-Friday 9am-5pm Cost/Fees None  Referral Recomneded to call for availability and intake process Scientist, Research (physical Sciences), Programme Researcher, Broadcasting/film/video, Housing Assistance, Engineer, Water, and [employer] Information Phone: [phone] Location [address] [place], [place], [place] [address] Eligibility Individuals and families experiencing homelessness or financial hardship Hours of Operation Office: Monday- Friday 8am-5pm Food Pantry: Monday- Friday 9:30am-3:30pm  Shelter: 24/7  for Residents Cost/Fees None  Referral Appointment and [facility] Options Resource Guide 51. [employer] [facility] Act together Emergency Shelter Services Housing and Cytogeneticist for [employer] Information Phone: [phone] Email: act_together@youthfocus .org Location [address] [place], [place] [address] Eligibility Services are available to youth aged 86 to 26 who are experiencing homelessness, have run away, are in family crises,  or are victims of abuse or neglect. Hours of Operation Shelter Services: 24/7 Office Hours: Monday- Friday 9am-5pm Cost/Fees None  Referral Referrals can be made 24 hours a day by calling the shelter directly. Youth may self-refer or be referred by parents,  guardians, schools, patent examiner, or other agencies. Servant [employer] for [employer] Phone: [phone] Fax: [phone] Email: [email or web address] .org Location [address] [place], [place] [address] Eligibility Services are specifically designed for veterans experiencing homelessness who have significant disabilities, including  severe or chronic medical and mental health conditions. Hours of Operation 24 hours for residents Office: Monday- Friday 9am-5pm Cost/Fees None  Referral Individuals can inquire about services by contacting [facility] directly via phone or email. For coordinated  entry into homelessness services in [facility], individuals may also contact [facility]  at [phone] or email [email or web address]@[facility]nersending[facility]elessness [email or web address]. Warmer, Drier, Safer: Designer, Television/film Set Information Phone: [phone] Fax: [phone] Location Physical Address: [address], [place], [place] [address]  Mailing Address: [address], [place], [place] [address] Eligibility Services are available to low-income homeowners in [place] who are unable to afford necessary home  repairs. Hours of Operation Monday- Friday 8:30am-4:30pm Cost/Fees TBD Referral Individuals can inquire about services by contacting [employer] directly via phone or through the  contact form on their website. Resource Guide 52. Interactive Resource Center [facility]) Services Case Management, Mental Health Support, Employment Assistance, and Housing Resources Contact Information Phone: [phone] Email: Elenor ann Location Physical Address: [address], [place], [place] [address]  Mailing Address: [address] [address], [place], [place] [address] Eligibility Services are available to individuals currently facing, experiencing, or transitioning out of homelessness. Hours of Operation Monday- Friday 8am-3pm  Cost/Fees None Referral Individuals can self-refer by visiting the [facility] during service hours. Community partners and service providers are  encouraged to coordinate with the [facility] Interim Librarian, Academic for service provision

## 2024-04-22 NOTE — TOC Initial Note (Addendum)
 Transition of Care [facility]) - Initial/Assessment Note    Patient Details  Name: Michele Owens MRN: [id number] Date of Birth: 1960-05-07  Transition of Care [facility]) CM/SW Contact:    Sudie [name], RN Phone Number: 04/22/2024, 3:07 PM  Clinical Narrative: Risk for readmission assessment completed. Patient presented for shortness of breath. Patient has DME: home oxygen  with [employer] 2 liters, rolling walker, and cane in the home. Patient states she lives alone in an apartment and does not have family support. Patient states PCP is Dr. Sebastian and she uses [employer] for transportation. Patient asked for OPPT at the [facility]. ICM did make the patient aware that she will need for PT to consult for recommendations. MD has placed the orders and ICM awaiting recommendations. If the plan is for OPPT- information will be placed on the AVS and Staff RN to highlight the area. No further needs identified.               Expected Discharge Plan: OP Rehab Barriers to Discharge: No Barriers Identified   Patient Goals and CMS Choice Patient states their goals for this hospitalization and ongoing recovery are:: Patient wants to return home with OPPT  Expected Discharge Plan and Services   Discharge Planning Services: CM Consult Post Acute Care Choice: NA  Prior Living Arrangements/Services     Patient language and need for interpreter reviewed:: Yes Do you feel safe going back to the place where you live?: Yes      Need for Family Participation in Patient Care: No (Comment) Care giver support system in place?: No (comment) Current home services: DME (home oxygen  with [employer] 2 Liters.) Criminal Activity/Legal Involvement Pertinent to Current Situation/Hospitalization: No - Comment as needed  Activities of Daily Living   ADL Screening (condition at time of admission) Independently performs ADLs?: No Does the patient have a NEW difficulty with  bathing/dressing/toileting/self-feeding that is expected to last >3 days?: Yes (Initiates electronic notice to provider for possible OT consult) Does the patient have a NEW difficulty with getting in/out of bed, walking, or climbing stairs that is expected to last >3 days?: No Does the patient have a NEW difficulty with communication that is expected to last >3 days?: No Is the patient deaf or have difficulty hearing?: No Does the patient have difficulty seeing, even when wearing glasses/contacts?: No Does the patient have difficulty concentrating, remembering, or making decisions?: No  Permission Sought/Granted Permission sought to share information with : Case Manager     Emotional Assessment Appearance:: Appears stated age Attitude/Demeanor/Rapport: Engaged Affect (typically observed): Appropriate Orientation: : Oriented to Self, Oriented to Place, Oriented to  Time, Oriented to Situation Alcohol / Substance Use: Not Applicable Psych Involvement: No (comment)  Admission diagnosis:  Acute respiratory failure with hypoxia (HCC) [J96.01] HCAP (healthcare-associated pneumonia) [J18.9] Community acquired pneumonia of right middle lobe of lung [J18.9] 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) 12/07/2023   Mild episode of recurrent major depressive disorder 12/05/2023   GAD (generalized anxiety disorder) 12/05/2023   Former smoker 12/05/2023   Hypokalemia 11/13/2023   Vitamin D  deficiency  11/13/2023   Chronic idiopathic constipation 11/13/2023    Cellulitis 09/09/2023   History of COPD 06/06/2023   Bipolar disorder (HCC) 06/06/2023   At risk for polypharmacy 02/07/2023   Dependence on continuous supplemental oxygen  02/07/2023   Acute respiratory failure with hypercapnia (HCC) 12/14/2022   Delirium due to multiple etiologies, acute, hyperactive 12/14/2022   Acute respiratory failure with hypoxia and hypercapnia (HCC) 10/25/2022   Acute on chronic diastolic CHF (congestive heart failure) (HCC) 10/23/2022   Current mild episode of major depressive disorder without prior episode 06/06/2022   Fever 06/04/2022   Acute kidney injury superimposed on chronic kidney disease 02/01/2022   Acquired hypothyroidism 02/01/2022   Chronic pain disorder 02/01/2022   Respiratory failure with hypercapnia (HCC) 12/23/2020   Anemia 12/23/2020   Diabetic peripheral neuropathy (HCC) 08/18/2020   Acute on chronic hypoxic respiratory failure (HCC) 04/18/2020   Hemoptysis 04/18/2020   Encephalopathy 04/18/2020   Chronic diastolic CHF (congestive heart failure) (HCC) 04/18/2020   Severe sepsis (HCC) 04/18/2020   Morbid obesity with BMI of 50.0-59.9, adult (HCC)    Fall at home, initial encounter 06/23/2018   HNP (herniated nucleus pulposus), thoracic 06/23/2018   Essential hypertension 08/28/2017   Obesity, class 3 (HCC) 08/28/2017   COPD exacerbation (HCC) 08/04/2017   Acute and chronic respiratory failure with hypercapnia (HCC)    Encounter for geriatric assessment 02/27/2017   Family history of thyroid  cancer    Hypomagnesemia 10/03/2016   Atypical chest pain    HLD (hyperlipidemia) 09/23/2016   COPD (chronic obstructive pulmonary disease) (HCC) 09/23/2016   Gout 09/23/2016   DVT (deep vein thrombosis) in pregnancy 09/23/2016   Thyroid  cancer (HCC) 09/23/2016   Hypocalcemia 09/23/2016   Acute pulmonary edema (HCC)    Acute hypoxemic respiratory failure (HCC)    Chronic hypoxic respiratory failure (HCC) 07/16/2016   CAP (community acquired  pneumonia) 01/23/2016   Multinodular goiter w/ dominant right thyroid  nodule 01/23/2016   Pulmonary hypertension (HCC) 01/23/2016   Obesity hypoventilation syndrome (HCC) 08/26/2015   Chest pain 06/16/2015   Non-insulin  dependent type 2 diabetes mellitus (HCC) 04/24/2015   Leukocytosis 04/24/2015   DM2 (diabetes mellitus, type 2) (HCC) 04/24/2015   Depression with anxiety 04/24/2015   Benign essential HTN 04/24/2015   Normocytic anemia 04/24/2015   Anxiety 04/24/2015   OSA (obstructive sleep apnea) 05/10/2012   Tobacco abuse 07/04/2011   PCP:  [name], MD Pharmacy:   [facility] - [place], [place] - [address] [place] [address] Phone: [phone] Fax: [phone]  Social Drivers of Health (SDOH) Social History: SDOH Screenings   Food Insecurity: No Food Insecurity (04/20/2024)  Housing: High Risk (04/20/2024)  Transportation Needs: Unmet Transportation Needs (04/20/2024)  Utilities: Not At Risk (04/20/2024)  Alcohol Screen: Low Risk (10/24/2022)  Depression (PHQ2-9): Medium Risk (12/25/2023)  Financial Resource Strain: Low Risk (10/24/2022)  Social Connections: Moderately Isolated (04/20/2024)  Tobacco Use: Medium Risk (04/20/2024)   Readmission Risk Interventions    03/25/2024   12:35 PM 01/23/2024   12:36 PM 07/09/2022   10:29 AM  Readmission Risk Prevention Plan  Transportation Screening Complete Complete Complete  PCP or Specialist Appt within 3-5 Days   Complete  HRI or Home Care Consult   Complete  Social Work Consult for Recovery Care Planning/Counseling   Complete  Palliative Care Screening   Not Applicable  Medication Review (RN Care Manager) Referral to Pharmacy Complete Not Complete  PCP or Specialist appointment within 3-5 days of  discharge Complete Complete   HRI or Home Care Consult Complete Complete   SW Recovery Care/Counseling Consult Complete    Palliative Care Screening Not Applicable Not Applicable    Skilled Nursing Facility Not Applicable Not Applicable

## 2024-04-22 NOTE — Consult Note (Signed)
 "  NAME:  Michele Owens, MRN:  [id number], DOB:  Dec 31, 1960, LOS: 2 ADMISSION DATE:  04/20/2024, CONSULTATION DATE:  04/22/2024 REFERRING MD:  TRH, CHIEF COMPLAINT:  cough   History of Present Illness:  64 y.o. woman with chronic hypoxemia presumed OSA/OHS here for evaluation of cough and ongoing infiltrate on chest imaging.  Patient mid to the hospital 12/25 here for pneumonia.  Posterior right upper lobe infiltrate but noted. As well as ~1 cm nodule. Treated with omnicef  after 2 days of IV abx. She got better. Cough worsened over last few days. Some mild worsening DOE. CT chest this admission similar with worsened RUL posterior segment infiltrate. She feels improved with the last 1-2 days in IV abx, cefepime  and vanc.   Pertinent  Medical History  Gout, HTN, neuropathy, OSA/OHS, PHTN WHO grp 2  Significant Hospital Events: Including procedures, antibiotic start and stop dates in addition to other pertinent events     Interim History / Subjective:    Objective    Blood pressure (!) 153/68, pulse 81, temperature 98.4 F (36.9 C), temperature source Oral, resp. rate 17, height 5' 5 (1.651 m), weight (!) 154.2 kg, SpO2 91%.        Intake/Output Summary (Last 24 hours) at 04/22/2024 1638 Last data filed at 04/22/2024 1511 Gross per 24 hour  Intake 1670 ml  Output 2151 ml  Net -481 ml   Filed Weights   04/20/24 0017 04/21/24 0342 04/22/24 0500  Weight: (!) 151 kg (!) 155.7 kg (!) 154.2 kg    Examination: General: sitting on bedside commode, in NAD HENT: AT/ Lungs: NWOB, on 2L CV: RRR Abdomen: ND Extremities: non pitting edema bilaterally, symmetric on exam Neuro: AAO, no deficits   Resolved problem list   Assessment and Plan   Recurrent infiltrate on chest x-ray/CT scan: With slight worsening on my review and interpretation of posterior right upper lobe ground glass opacity from 03/29/2025 to 04/29/2024, this admission.  Considerations include slow to resolve  bacterial pneumonia, nonbacterial inflammatory pneumonia (org pan, eos pna), pulmonary edema.  With asymmetric nature makes pulm edema less likely in my mind. --obtain LRCx, RVP ordered --Agree with broad spectrum abx, plan 7 days since failed prior conservative PO therapy -Consider bronchoscopy if unable to ascertain pathogen but she is higher risk due to habitus and chronic hypoxemia --CBC diff to evaluate for eosinophilia  Lung Nodule: --Will need f/u in nodule clinic  Labs   CBC: Recent Labs  Lab 04/20/24 0157 04/21/24 0912  WBC 12.1* 13.6*  HGB 11.2* 10.5*  HCT 37.4 35.3*  MCV 103.3* 103.2*  PLT 253 227    Basic Metabolic Panel: Recent Labs  Lab 04/20/24 0157 04/22/24 0411  NA 140 141  K 4.9 3.9  CL 96* 92*  CO2 35* 40*  GLUCOSE 153* 132*  BUN 20 12  CREATININE 1.10* 0.71  CALCIUM  7.9* 8.6*  MG 1.9  --   PHOS 4.2  --    GFR: Estimated Creatinine Clearance: 107.6 mL/min (by C-G formula based on SCr of 0.71 mg/dL). Recent Labs  Lab 04/20/24 0157 04/20/24 0417 04/20/24 0625 04/21/24 0912  PROCALCITON <0.10  --   --   --   WBC 12.1*  --   --  13.6*  LATICACIDVEN  --  2.3* 1.4  --     Liver Function Tests: Recent Labs  Lab 04/20/24 0157  AST 29  ALT 24  ALKPHOS 56  BILITOT 0.2  PROT 7.2  ALBUMIN  4.2  No results for input(s): LIPASE, AMYLASE in the last 168 hours. No results for input(s): AMMONIA  in the last 168 hours.  ABG    Component Value Date/Time   PHART 7.376 10/23/2022 1711   PCO2ART 59.7 (H) 10/23/2022 1711   PO2ART 65 (L) 10/23/2022 1711   HCO3 35.3 (H) 03/31/2024 2047   TCO2 37 (H) 03/31/2024 2047   O2SAT 70 03/31/2024 2047     Coagulation Profile: No results for input(s): INR, PROTIME in the last 168 hours.  Cardiac Enzymes: No results for input(s): CKTOTAL, CKMB, CKMBINDEX, TROPONINI in the last 168 hours.  HbA1C: Hgb A1c MFr Bld  Date/Time Value Ref Range Status  01/22/2024 01:40 AM 6.9 (H) 4.8 - 5.6  % Final    Comment:    (NOTE) Diagnosis of Diabetes The following HbA1c ranges recommended by the American Diabetes Association (ADA) may be used as an aid in the diagnosis of diabetes mellitus.  Hemoglobin             Suggested A1C NGSP%              Diagnosis  <5.7                   Non Diabetic  5.7-6.4                Pre-Diabetic  >6.4                   Diabetic  <7.0                   Glycemic control for                       adults with diabetes.    09/09/2023 02:38 PM 6.8 (H) 4.8 - 5.6 % Final    Comment:    (NOTE) Diagnosis of Diabetes The following HbA1c ranges recommended by the American Diabetes Association (ADA) may be used as an aid in the diagnosis of diabetes mellitus.  Hemoglobin             Suggested A1C NGSP%              Diagnosis  <5.7                   Non Diabetic  5.7-6.4                Pre-Diabetic  >6.4                   Diabetic  <7.0                   Glycemic control for                       adults with diabetes.      CBG: Recent Labs  Lab 04/21/24 1200 04/21/24 1632 04/21/24 2107 04/22/24 0906 04/22/24 1256  GLUCAP 176* 169* 167* 188* 216*    Review of Systems:   Comprehensive review of systems is negative unless mentioned in HPI above  Past Medical History:  She,  has a past medical history of Anxiety, Arthritis, Asthma, Chronic diastolic CHF (congestive heart failure) (HCC), COPD (chronic obstructive pulmonary disease) (HCC), Depression, Diabetes mellitus without complication (HCC), GERD (gastroesophageal reflux disease), Gout, Headache, History of DVT of lower extremity, Hypertension, Morbid obesity with BMI of 45.0-49.9, adult (HCC), and Thyroid  cancer (HCC) (09/23/2016).   Surgical History:  Past Surgical History:  Procedure Laterality Date   BUNIONECTOMY Bilateral    CARDIAC CATHETERIZATION N/A 06/23/2015   Procedure: Right/Left Heart Cath and Coronary Angiography;  Surgeon: Ezra GORMAN Shuck, MD;  Location: [facility]  INVASIVE CV LAB;  Service: Cardiovascular;  Laterality: N/A;   COLONOSCOPY WITH PROPOFOL  N/A 12/15/2015   Procedure: COLONOSCOPY WITH PROPOFOL ;  Surgeon: Norleen Hint, MD;  Location: [facility] ENDOSCOPY;  Service: Endoscopy;  Laterality: N/A;   RIGHT HEART CATH N/A 10/01/2019   Procedure: RIGHT HEART CATH;  Surgeon: Shuck Ezra GORMAN, MD;  Location: [facility] INVASIVE CV LAB;  Service: Cardiovascular;  Laterality: N/A;   RIGHT HEART CATH N/A 03/03/2024   Procedure: RIGHT HEART CATH;  Surgeon: Shuck Ezra GORMAN, MD;  Location: [facility] INVASIVE CV LAB;  Service: Cardiovascular;  Laterality: N/A;   THYROIDECTOMY  09/19/2016   THYROIDECTOMY N/A 09/19/2016   Procedure: TOTAL THYROIDECTOMY;  Surgeon: Eletha Boas, MD;  Location: [facility] OR;  Service: General;  Laterality: N/A;   TONSILLECTOMY     TOTAL ABDOMINAL HYSTERECTOMY  07/14/10     Social History:   reports that she quit smoking about 7 years ago. Her smoking use included cigarettes. She started smoking about 48 years ago. She has a 20.5 pack-year smoking history. She has never used smokeless tobacco. She reports that she does not currently use alcohol. She reports that she does not currently use drugs after having used the following drugs: Marijuana and Cocaine.   Family History:  Her family history includes Brain cancer in her paternal aunt; Cancer in her cousin, cousin, father, and maternal uncle; Diabetes in her mother; Thyroid  cancer in her mother.   Allergies Allergies[1]   Home Medications  Prior to Admission medications  Medication Sig Start Date End Date Taking? Authorizing Provider  acetaminophen  (TYLENOL ) 650 MG CR tablet Take 1,950 mg by mouth every 8 (eight) hours as needed for pain.   Yes [provider]  albuterol  (PROVENTIL ) (2.5 MG/3ML) 0.083% nebulizer solution Take 3 mLs (2.5 mg total) by nebulization every 6 (six) hours as needed for wheezing or shortness of breath. Patient taking differently: Take 2.5 mg by nebulization in the morning, at  noon, and at bedtime. 03/13/24  Yes Donnajean Lynwood DEL, PA-C  albuterol  (VENTOLIN  HFA) 108 (90 Base) MCG/ACT inhaler Inhale 1-2 puffs into the lungs every 6 (six) hours as needed for wheezing or shortness of breath. 12/26/23  Yes [name], MD  allopurinol  (ZYLOPRIM ) 100 MG tablet Take 1 tablet (100 mg total) by mouth daily. 11/13/23 11/07/24 Yes [name], MD  aspirin  EC 81 MG tablet Take 1 tablet (81 mg total) by mouth daily. Swallow whole. 11/13/23 11/07/24 Yes [name], MD  busPIRone  (BUSPAR ) 30 MG tablet Take 1 tablet (30 mg total) by mouth 2 (two) times daily. 12/05/23 11/29/24 Yes [name], MD  butalbital -acetaminophen -caffeine  (FIORICET ) 50-325-40 MG tablet Take 1 tablet by mouth every 6 (six) hours as needed for headache. 04/15/24  Yes Thedora Garnette HERO, MD  carvedilol  (COREG ) 6.25 MG tablet Take 1 tablet (6.25 mg total) by mouth 2 (two) times daily. 12/26/23  Yes [name], MD  clotrimazole  (LOTRIMIN ) 1 % cream Apply topically 2 (two) times daily. 12/13/23  Yes [name], MD  diclofenac  Sodium (VOLTAREN ) 1 % GEL Apply 4 g topically 4 (four) times daily. 12/15/23  Yes Freddi Hamilton, MD  dicyclomine  (BENTYL ) 20 MG tablet Take 1 tablet (20 mg total) by mouth 2 (two) times daily. 04/15/24  Yes Thedora Garnette  M, MD  doxepin  (SINEQUAN ) 10 MG capsule Take 10 mg by mouth at bedtime.   Yes [provider]  doxycycline  (MONODOX ) 100 MG capsule Take 100 mg by mouth 2 (two) times daily. 04/18/24  Yes [provider]  EPINEPHrine  0.3 mg/0.3 mL IJ SOAJ injection Inject 0.3 mg into the muscle once as needed for anaphylaxis.   Yes [provider]  fenofibrate  (TRICOR ) 145 MG tablet Take 1 tablet (145 mg total) by mouth daily. 12/26/23  Yes [name], MD  FEROSUL 325 (65 Fe) MG tablet Take 1 tablet (325 mg total) by mouth daily. 12/26/23  Yes [name], MD  gabapentin  (NEURONTIN ) 100 MG capsule Take 1 capsule (100 mg total) by mouth 2 (two)  times daily. 04/15/24  Yes Thedora Garnette HERO, MD  HYDROcodone -acetaminophen  (NORCO/VICODIN) 5-325 MG tablet Take 1 tablet by mouth every 4 (four) hours as needed for severe pain (pain score 7-10). Patient taking differently: Take 1 tablet by mouth every 8 (eight) hours as needed for severe pain (pain score 7-10). 03/25/24  Yes Sigdel, Santosh, MD  levocetirizine (XYZAL ) 5 MG tablet TAKE 1 TABLET (5 MG TOTAL) BY MOUTH AT BEDTIME. 12/17/23  Yes [name], MD  levothyroxine  (SYNTHROID ) 125 MCG tablet TAKE 1 TABLET (125 MCG TOTAL) BY MOUTH AT BEDTIME. 03/20/24  Yes [name], MD  lidocaine  (HM LIDOCAINE  PATCH) 4 % Place 1 patch onto the skin daily. 03/13/24  Yes Donnajean Lynwood DEL, PA-C  [Paused] losartan  (COZAAR ) 25 MG tablet Take 1 tablet (25 mg total) by mouth daily. Wait to take this until your doctor or other care provider tells you to start again. 11/13/23  Yes [name], MD  lurasidone  (LATUDA ) 20 MG TABS tablet Take 1 tablet (20 mg total) by mouth at bedtime. Take with a snack. This is for mood and sleep. 12/25/23 12/19/24 Yes [name], MD  nitroGLYCERIN  (NITROSTAT ) 0.4 MG SL tablet Place 1 tablet (0.4 mg total) under the tongue every 5 (five) minutes as needed for chest pain. 04/15/24  Yes Thedora Garnette HERO, MD  omeprazole  (PRILOSEC) 20 MG capsule TAKE 2 CAPSULES (40 MG TOTAL) BY MOUTH DAILY (2AM) 03/20/24  Yes [name], MD  ondansetron  (ZOFRAN -ODT) 4 MG disintegrating tablet Take 1 tablet (4 mg total) by mouth every 8 (eight) hours as needed for nausea or vomiting. 12/26/23  Yes [name], MD  potassium chloride  (KLOR-CON ) 10 MEQ tablet Take 10 mEq by mouth 2 (two) times daily. 04/17/24  Yes [provider]  rosuvastatin  (CRESTOR ) 10 MG tablet Take 1 tablet (10 mg total) by mouth every evening. 11/13/23  Yes [name], MD  [Paused] torsemide  (DEMADEX ) 20 MG tablet Take 4 tablets (80 mg total) by mouth 2 (two) times daily. Patient taking differently:  Take 60 mg by mouth 2 (two) times daily. Wait to take this until your doctor or other care provider tells you to start again. 03/19/24  Yes Hayes Beckey CROME, NP  umeclidinium-vilanterol (ANORO ELLIPTA ) 62.5-25 MCG/ACT AEPB Inhale 1 puff into the lungs daily. 01/02/24  Yes [name], MD  Vitamin D , Ergocalciferol , (DRISDOL ) 1.25 MG (50000 UNIT) CAPS capsule Take 1 capsule (50,000 Units total) by mouth every 7 (seven) days. 11/13/23  Yes [name], MD  ACCU-CHEK GUIDE TEST test strip USE TO CHECK BLOOD SUGAR 3 TIMES DAILY 03/04/24   Webb, Padonda B, FNP  Accu-Chek Softclix Lancets lancets Use to check blood sugar 3 times daily 12/26/23  [name], MD  blood glucose meter kit and supplies KIT Dispense based on patient and insurance preference. Use up to four times daily as directed. 12/25/20   Lue Elsie BROCKS, MD  Blood Glucose Monitoring Suppl DEVI 1 each by Does not apply route in the morning, at noon, and at bedtime. May substitute to any manufacturer covered by patient's insurance. 11/13/23   [name], MD  [Paused] metolazone  (ZAROXOLYN ) 2.5 MG tablet Take 1 tablet (2.5 mg total) by mouth as directed. TAKE ONE DOSE OF METOLAZONE  2.5MG  TODAY, AND TOMORROW WITH (2) TABLETS OF POTASSIUM WITH THIS Patient not taking: Reported on 04/20/2024 Wait to take this until your doctor or other care provider tells you to start again. 03/19/24   Hayes Beckey CROME, NP  OXYGEN  Inhale 2 L into the lungs continuous.    [provider]  potassium chloride  (KLOR-CON  M) 10 MEQ tablet Take 2 tablets (20 mEq total) by mouth daily. Patient not taking: Reported on 04/20/2024 03/03/24   Rolan Ezra RAMAN, MD  rizatriptan  (MAXALT ) 10 MG tablet Take 1 tablet (10 mg total) by mouth as needed for migraine. May repeat in 2 hours if needed Patient not taking: Reported on 04/20/2024 04/01/24   Billy Knee, FNP     Critical care time: n/a    Donnice JONELLE Beals, MD See TRACEY           [1]  Allergies Allergen Reactions   Bee Venom Anaphylaxis    All over my body (swelling)   Fioricet  [Butalbital -Apap-Caffeine ] Nausea And Vomiting and Rash   Ibuprofen  Rash and Other (See Comments)    Severe rash   Lamisil  [Terbinafine ] Rash and Other (See Comments)    Pt states this causes her to feel funny   Nsaids Other (See Comments)    Per MD's orders    "

## 2024-04-22 NOTE — TOC CM/SW Note (Signed)
 Transition of Care (TOC) CM/SW Note    SDOH completed. Housing and Transportation resources attached to patients AVS.

## 2024-04-22 NOTE — Progress Notes (Signed)
 " PROGRESS NOTE    Michele Owens  FMW:[id number] DOB: [birth year] DOA: 04/20/2024 PCP: [name], MD   Brief Narrative: 64 year old with past medical history significant for chronic diastolic heart failure, COPD, chronic hypoxic respiratory failure on 2 L of oxygen , diabetes type 2 who presents with worsening shortness of breath found to have severe sepsis due to pneumonia complicated with acute on chronic hypoxic respiratory failure.  Of note patient had a recent hospitalization from 03/23/2024 to 03/25/2024 for pneumonia treated with antibiotics.  Presented again 12/26: Complaining of worsening shortness of breath at that time discharged on doxycycline  as an outpatient.  She presented with worsening shortness of breath over the last week associated with increased cough. Evaluation in the ED consistent with COVID RSV PCR negative, chest x-ray showed interval worsening right middle lobe infiltrate concerning for worsening pneumonia.   Assessment & Plan:   Principal Problem:   HCAP (healthcare-associated pneumonia) Active Problems:   DM2 (diabetes mellitus, type 2) (HCC)   Acquired hypothyroidism   HLD (hyperlipidemia)   Acute on chronic hypoxic respiratory failure (HCC)   Chronic diastolic CHF (congestive heart failure) (HCC)   Severe sepsis (HCC)   History of COPD   Allergic rhinitis  1-Severe sepsis due to healthcare associated pneumonia: PNA:  - Patient with worsening cough, shortness of breath.  Chest x-ray showed worsening right middle lobe infiltrate.  Lactic acid: - Blood cultures: no growth to date.  - Continue IV vancomycin  and cefepime  day 2 -sputum culture ordered.  -Mycoplasma pending, Legionella pending, strep pneumonia negative. -MRSA PCR pending -No aspiration on MBS and speech evaluation.  -Pulmonologist consulted due to recurrent PNA>  -CT chest has been ordered.   Acute on chronic hypoxic respiratory failure - On chronic 2 L of oxygen  at home,  currently requiring 3 L -Continue Anoro Ellipta , albuterol  as needed -Continue Pulmicort   Chronic diastolic heart failure - Diuretics were held during last hospitalization due to AKI.  This was supposed to be resumed by PCP on follow-up. - Resume  torsemide  on 1/12 : 40 mg BID.   History of COPD - Continue Anoro Ellipta , albuterol , Pulmicort   Diabetes type 2: - Continue sliding scale insulin   Hyperlipidemia: - Continue with fenofibrate  and Crestor   Acquired hypothyroidism - Continue Synthroid   Allergic rhinitis - Continue Claritin   Chronic pain: Resume gabapentin  Anxiety: Continue  BuSpar , latuda .   Estimated body mass index is 56.58 kg/m as calculated from the following:   Height as of this encounter: 5' 5 (1.651 m).   Weight as of this encounter: 154.2 kg.   DVT prophylaxis: Lovenox  Code Status: Full code Family Communication: Disposition Plan:  Status is: Inpatient Remains inpatient appropriate because: Management of Resp failure, PNA    Consultants:  None  Procedures:  none Antimicrobials:    Subjective: She agreed to have CT chest today. Denies worsening dyspnea. Needs to go to bathroom, staff informed   Objective: Vitals:   04/21/24 2003 04/22/24 0500 04/22/24 0858 04/22/24 0909  BP: 125/73 127/71  (!) 140/70  Pulse: 88 82  86  Resp: 18 18  18   Temp: 97.9 F (36.6 C) 97.8 F (36.6 C)  97.8 F (36.6 C)  TempSrc: Oral Oral  Oral  SpO2: 96% 95% 94%   Weight:  (!) 154.2 kg    Height:        Intake/Output Summary (Last 24 hours) at 04/22/2024 1137 Last data filed at 04/22/2024 1100 Gross per 24 hour  Intake 1130 ml  Output 3751  ml  Net -2621 ml   Filed Weights   04/20/24 0017 04/21/24 0342 04/22/24 0500  Weight: (!) 151 kg (!) 155.7 kg (!) 154.2 kg    Examination:  General exam: NAD Respiratory system: Normal resp effort, No ronchus.  Cardiovascular system: SS 1, S 2 RRR Gastrointestinal system: BS present, soft, nt Central nervous  system: Alert, follows command Extremities: Symmetric 5 x 5 power.   Data Reviewed: I have personally reviewed following labs and imaging studies  CBC: Recent Labs  Lab 04/20/24 0157 04/21/24 0912  WBC 12.1* 13.6*  HGB 11.2* 10.5*  HCT 37.4 35.3*  MCV 103.3* 103.2*  PLT 253 227   Basic Metabolic Panel: Recent Labs  Lab 04/20/24 0157 04/22/24 0411  NA 140 141  K 4.9 3.9  CL 96* 92*  CO2 35* 40*  GLUCOSE 153* 132*  BUN 20 12  CREATININE 1.10* 0.71  CALCIUM  7.9* 8.6*  MG 1.9  --   PHOS 4.2  --    GFR: Estimated Creatinine Clearance: 107.6 mL/min (by C-G formula based on SCr of 0.71 mg/dL). Liver Function Tests: Recent Labs  Lab 04/20/24 0157  AST 29  ALT 24  ALKPHOS 56  BILITOT 0.2  PROT 7.2  ALBUMIN  4.2   No results for input(s): LIPASE, AMYLASE in the last 168 hours. No results for input(s): AMMONIA  in the last 168 hours. Coagulation Profile: No results for input(s): INR, PROTIME in the last 168 hours. Cardiac Enzymes: No results for input(s): CKTOTAL, CKMB, CKMBINDEX, TROPONINI in the last 168 hours. BNP (last 3 results) Recent Labs    01/16/24 2043 03/31/24 2042 04/20/24 0157  PROBNP 183.0 183.0 257.0   HbA1C: No results for input(s): HGBA1C in the last 72 hours. CBG: Recent Labs  Lab 04/21/24 0758 04/21/24 1200 04/21/24 1632 04/21/24 2107 04/22/24 0906  GLUCAP 122* 176* 169* 167* 188*   Lipid Profile: No results for input(s): CHOL, HDL, LDLCALC, TRIG, CHOLHDL, LDLDIRECT in the last 72 hours. Thyroid  Function Tests: No results for input(s): TSH, T4TOTAL, FREET4, T3FREE, THYROIDAB in the last 72 hours. Anemia Panel: No results for input(s): VITAMINB12, FOLATE, FERRITIN, TIBC, IRON, RETICCTPCT in the last 72 hours. Sepsis Labs: Recent Labs  Lab 04/20/24 0157 04/20/24 0417 04/20/24 0625  PROCALCITON <0.10  --   --   LATICACIDVEN  --  2.3* 1.4    Recent Results (from the past 240  hours)  Resp panel by RT-PCR (RSV, Flu A&B, Covid) Anterior Nasal Swab     Status: None   Collection Time: 04/20/24  1:20 AM   Specimen: Anterior Nasal Swab  Result Value Ref Range Status   SARS Coronavirus 2 by RT PCR NEGATIVE NEGATIVE Final   Influenza A by PCR NEGATIVE NEGATIVE Final   Influenza B by PCR NEGATIVE NEGATIVE Final    Comment: (NOTE) The Xpert Xpress SARS-CoV-2/FLU/RSV plus assay is intended as an aid in the diagnosis of influenza from Nasopharyngeal swab specimens and should not be used as a sole basis for treatment. Nasal washings and aspirates are unacceptable for Xpert Xpress SARS-CoV-2/FLU/RSV testing.  Fact Sheet for Patients: bloggercourse.com  Fact Sheet for Healthcare Providers: seriousbroker.it  This test is not yet approved or cleared by the [place]  FDA and has been authorized for detection and/or diagnosis of SARS-CoV-2 by FDA under an Emergency Use Authorization (EUA). This EUA will remain in effect (meaning this test can be used) for the duration of the COVID-19 declaration under Section 564(b)(1) of the Act, 21 U.S.C. section 360bbb-3(b)(1),  unless the authorization is terminated or revoked.     Resp Syncytial Virus by PCR NEGATIVE NEGATIVE Final    Comment: (NOTE) Fact Sheet for Patients: bloggercourse.com  Fact Sheet for Healthcare Providers: seriousbroker.it  This test is not yet approved or cleared by the [place]  FDA and has been authorized for detection and/or diagnosis of SARS-CoV-2 by FDA under an Emergency Use Authorization (EUA). This EUA will remain in effect (meaning this test can be used) for the duration of the COVID-19 declaration under Section 564(b)(1) of the Act, 21 U.S.C. section 360bbb-3(b)(1), unless the authorization is terminated or revoked.  Performed at [facility], [address], [place],  [place] [address]   Blood culture (routine x 2)     Status: None (Preliminary result)   Collection Time: 04/20/24  4:05 AM   Specimen: BLOOD  Result Value Ref Range Status   Specimen Description BLOOD BLOOD LEFT ARM  Final   Special Requests   Final    BOTTLES DRAWN AEROBIC AND ANAEROBIC Blood Culture results may not be optimal due to an inadequate volume of blood received in culture bottles   Culture   Final    NO GROWTH 2 DAYS Performed at [facility], [address], [place], [place] [address]    Report Status PENDING  Incomplete  Blood culture (routine x 2)     Status: None (Preliminary result)   Collection Time: 04/20/24  4:12 AM   Specimen: BLOOD LEFT ARM  Result Value Ref Range Status   Specimen Description BLOOD LEFT ARM  Final   Special Requests   Final    BOTTLES DRAWN AEROBIC AND ANAEROBIC Blood Culture results may not be optimal due to an inadequate volume of blood received in culture bottles   Culture   Final    NO GROWTH 2 DAYS Performed at [facility], [address] [place], [place] [address]    Report Status PENDING  Incomplete         Radiology Studies: DG Swallowing Func-Speech Pathology Result Date: 04/21/2024 Table formatting from the original result was not included. Modified Barium Swallow Study Patient Details Name: Michele Owens MRN: [id number] Date of Birth: [birth year] Today's Date: 04/21/2024 HPI/PMH: HPI: Jalayia Bagheri is a 63 y.o. female who is admitted to [facility] on 04/20/2024 with severe sepsis due to HCAP pneumonia complicated by acute on chronic hypoxic respiratory failure after presenting from home to [facility] ED complaining of shortness of breath. Pt with recent admissions for pna x2 in Decembers.  She reports additional cases of pna over the past year including summer time. CXR 1/11:  Progressive consolidation within the right mid lung zone. Pt with medical history significant for chronic diastolic heart failure, COPD, chronic  Evoxac respiratory failure on 2 L continuous nasal cannula, type 2 diabetes mellitus. Clinical Impression: Pt presents with grossly normal swallow function.  There was trace, transient penetration of thin liquids only, which is [facility].  There was no aspiration of any consistencies trialed.  There was trace residue. Esophageal sweep could not be completed 2/2 use of C-Arm unit to complete this study.  There was no retention or backflow of contrast in cervical esophagus. Recommend continuing regular texture diet with thin liquids. DIGEST Swallow Severity Rating*  Safety: 0  Efficiency: 0  Overall Pharyngeal Swallow Severity: 0 1: mild; 2: moderate; 3: severe; 4: profound *The Dynamic Imaging Grade of Swallowing Toxicity is standardized for the head and neck cancer  population, however, demonstrates promising clinical applications across populations to standardize the clinical rating of pharyngeal swallow safety and severity. Factors that may increase risk of adverse event in presence of aspiration Noe & Lianne 2021): No data recorded Recommendations/Plan: Swallowing Evaluation Recommendations Swallowing Evaluation Recommendations Recommendations: PO diet PO Diet Recommendation: Regular; Thin liquids (Level 0) Liquid Administration via: Cup; Straw Medication Administration: Whole meds with liquid Supervision: Patient able to self-feed Postural changes: Position pt fully upright for meals Oral care recommendations: Staff/trained caregiver to provide oral care Treatment Plan Treatment Plan Treatment recommendations: Therapy as outlined in treatment plan below Follow-up recommendations: No SLP follow up Functional status assessment: Patient has not had a recent decline in their functional status. Treatment frequency: -- (N/A) Recommendations Recommendations for follow up therapy are one component of a multi-disciplinary discharge planning process, led by the attending physician.  Recommendations may be updated based on  patient status, additional functional criteria and insurance authorization. Assessment: Orofacial Exam: Orofacial Exam Oral Cavity: Oral Hygiene: WFL Oral Cavity - Dentition: Adequate natural dentition; Missing dentition Orofacial Anatomy: WFL Oral Motor/Sensory Function: -- (See BSE) Anatomy: Anatomy: WFL Boluses Administered: Boluses Administered Boluses Administered: Thin liquids (Level 0); Puree; Solid  Oral Impairment Domain: Oral Impairment Domain Lip Closure: No labial escape Tongue control during bolus hold: Not tested Bolus preparation/mastication: Timely and efficient chewing and mashing Bolus transport/lingual motion: Brisk tongue motion Oral residue: Trace residue lining oral structures Location of oral residue : Tongue Initiation of pharyngeal swallow : Posterior laryngeal surface of the epiglottis  Pharyngeal Impairment Domain: Pharyngeal Impairment Domain Soft palate elevation: No bolus between soft palate (SP)/pharyngeal wall (PW) Laryngeal elevation: Complete superior movement of thyroid  cartilage with complete approximation of arytenoids to epiglottic petiole Anterior hyoid excursion: Complete anterior movement Epiglottic movement: Complete inversion Laryngeal vestibule closure: Complete, no air/contrast in laryngeal vestibule Pharyngeal stripping wave : Present - complete Pharyngoesophageal segment opening: Complete distension and complete duration, no obstruction of flow Tongue base retraction: Trace column of contrast or air between tongue base and PPW Pharyngeal residue: Trace residue within or on pharyngeal structures Location of pharyngeal residue: Valleculae  Esophageal Impairment Domain: Esophageal Impairment Domain Esophageal clearance upright position: -- (unable to assess) Pill: Pill Consistency administered: Thin liquids (Level 0) Penetration/Aspiration Scale Score: Penetration/Aspiration Scale Score 1.  Material does not enter airway: Puree; Solid; Pill 2.  Material enters airway,  remains ABOVE vocal cords then ejected out: Thin liquids (Level 0) Compensatory Strategies: Compensatory Strategies Compensatory strategies: No   General Information: Caregiver present: No  Diet Prior to this Study: Regular; Thin liquids (Level 0)   Temperature : Normal   Respiratory Status: WFL   Supplemental O2: Nasal cannula   History of Recent Intubation: No  Behavior/Cognition: Alert; Cooperative Self-Feeding Abilities: Able to self-feed Baseline vocal quality/speech: -- (harsh) No data recorded Volitional Swallow: Able to elicit Exam Limitations: No limitations Goal Planning: Prognosis for improved oropharyngeal function: -- (N/A) No data recorded No data recorded Patient/Family Stated Goal: no deficits/none stated Consulted and agree with results and recommendations: Patient; Physician Pain: Pain Assessment Pain Assessment: -- (none reported) End of Session: Start Time:SLP Start Time (ACUTE ONLY): 1010 Stop Time: SLP Stop Time (ACUTE ONLY): 1028 Time Calculation:SLP Time Calculation (min) (ACUTE ONLY): 18 min Charges: SLP Evaluations $ SLP Speech Visit: 1 Visit SLP Evaluations $BSS Swallow: 1 Procedure $MBS Swallow: 1 Procedure SLP visit diagnosis: SLP Visit Diagnosis: Dysphagia, unspecified (R13.10) Past Medical History: Past Medical History: Diagnosis Date  Anxiety   Arthritis  Asthma   Chronic diastolic CHF (congestive heart failure) (HCC)   COPD (chronic obstructive pulmonary disease) (HCC)   Uses Oxygen  at night  Depression   Diabetes mellitus without complication (HCC)   GERD (gastroesophageal reflux disease)   Gout   Headache   migraines  History of DVT of lower extremity   Hypertension   Morbid obesity with BMI of 45.0-49.9, adult (HCC)   Thyroid  cancer (HCC) 09/23/2016 Past Surgical History: Past Surgical History: Procedure Laterality Date  BUNIONECTOMY Bilateral   CARDIAC CATHETERIZATION N/A 06/23/2015  Procedure: Right/Left Heart Cath and Coronary Angiography;  Surgeon: Ezra GORMAN Shuck, MD;   Location: [facility] INVASIVE CV LAB;  Service: Cardiovascular;  Laterality: N/A;  COLONOSCOPY WITH PROPOFOL  N/A 12/15/2015  Procedure: COLONOSCOPY WITH PROPOFOL ;  Surgeon: Norleen Hint, MD;  Location: [facility] ENDOSCOPY;  Service: Endoscopy;  Laterality: N/A;  RIGHT HEART CATH N/A 10/01/2019  Procedure: RIGHT HEART CATH;  Surgeon: Shuck Ezra GORMAN, MD;  Location: [facility] INVASIVE CV LAB;  Service: Cardiovascular;  Laterality: N/A;  RIGHT HEART CATH N/A 03/03/2024  Procedure: RIGHT HEART CATH;  Surgeon: Shuck Ezra GORMAN, MD;  Location: [facility] INVASIVE CV LAB;  Service: Cardiovascular;  Laterality: N/A;  THYROIDECTOMY  09/19/2016  THYROIDECTOMY N/A 09/19/2016  Procedure: TOTAL THYROIDECTOMY;  Surgeon: Eletha Boas, MD;  Location: [facility] OR;  Service: General;  Laterality: N/A;  TONSILLECTOMY    TOTAL ABDOMINAL HYSTERECTOMY  07/14/10 Anette FORBES Grippe, MA, CCC-SLP Acute Rehabilitation Services Office: [phone] 04/21/2024, 11:29 AM  DG C-Arm 1-60 Min-No Report Result Date: 04/21/2024 Fluoroscopy was utilized by the requesting physician.  No radiographic interpretation.        Scheduled Meds:  budesonide   0.25 mg Nebulization BID   busPIRone   30 mg Oral BID   enoxaparin  (LOVENOX ) injection  70 mg Subcutaneous Q24H   fenofibrate   54 mg Oral Daily   gabapentin   100 mg Oral BID   guaiFENesin   1,200 mg Oral BID   insulin  aspart  0-5 Units Subcutaneous QHS   insulin  aspart  0-9 Units Subcutaneous TID WC   levothyroxine   125 mcg Oral QHS   loratadine   10 mg Oral QHS   lurasidone   20 mg Oral QHS   rosuvastatin   10 mg Oral QPM   torsemide   40 mg Oral BID   umeclidinium-vilanterol  1 puff Inhalation Daily   Continuous Infusions:  ceFEPime  (MAXIPIME ) IV 2 g (04/22/24 0532)   vancomycin        LOS: 2 days    Time spent: 35 Minutes    Ajanae Virag A Ketih Goodie, MD [facility]   If 7PM-7AM, please contact night-coverage [email or web address]  04/22/2024, 11:37 AM   "

## 2024-04-22 NOTE — Progress Notes (Signed)
 Pharmacy Antibiotic Note  Michele Owens is a 64 y.o. female admitted on 04/20/2024 with recurrent pneumonia. Pharmacy has been consulted for Vancomycin  dosing. SCr has improved so will increase dose.  Plan: Increase vancomycin  to 2000mg  IV q24h Continue cefepime  2g IV q8h F/U MRSA PCR   Height: 5' 5 (165.1 cm) Weight: (!) 154.2 kg (340 lb) IBW/kg (Calculated) : 57  Temp (24hrs), Avg:97.9 F (36.6 C), Min:97.7 F (36.5 C), Max:98.1 F (36.7 C)  Recent Labs  Lab 04/20/24 0157 04/20/24 0417 04/20/24 0625 04/21/24 0912 04/22/24 0411  WBC 12.1*  --   --  13.6*  --   CREATININE 1.10*  --   --   --  0.71  LATICACIDVEN  --  2.3* 1.4  --   --     Estimated Creatinine Clearance: 107.6 mL/min (by C-G formula based on SCr of 0.71 mg/dL).    Allergies[1]    Ozell Jamaica, PharmD, BCPS, [facility] Clinical Pharmacist [phone] Please check AMION for all [facility] Pharmacy numbers 04/22/2024     [1]  Allergies Allergen Reactions   Bee Venom Anaphylaxis    All over my body (swelling)   Fioricet  [Butalbital -Apap-Caffeine ] Nausea And Vomiting and Rash   Ibuprofen  Rash and Other (See Comments)    Severe rash   Lamisil  [Terbinafine ] Rash and Other (See Comments)    Pt states this causes her to feel funny   Nsaids Other (See Comments)    Per MD's orders

## 2024-04-23 ENCOUNTER — Ambulatory Visit: Admitting: Family Medicine

## 2024-04-23 ENCOUNTER — Encounter: Payer: Self-pay | Admitting: Family Medicine

## 2024-04-23 DIAGNOSIS — R600 Localized edema: Secondary | ICD-10-CM | POA: Diagnosis not present

## 2024-04-23 DIAGNOSIS — J189 Pneumonia, unspecified organism: Secondary | ICD-10-CM | POA: Diagnosis not present

## 2024-04-23 LAB — CBC WITH DIFFERENTIAL/PLATELET
Abs Immature Granulocytes: 0.13 K/uL — ABNORMAL HIGH (ref 0.00–0.07)
Basophils Absolute: 0.1 K/uL (ref 0.0–0.1)
Basophils Relative: 1 %
Eosinophils Absolute: 0.3 K/uL (ref 0.0–0.5)
Eosinophils Relative: 2 %
HCT: 37.2 % (ref 36.0–46.0)
Hemoglobin: 11.1 g/dL — ABNORMAL LOW (ref 12.0–15.0)
Immature Granulocytes: 1 %
Lymphocytes Relative: 14 %
Lymphs Abs: 1.6 K/uL (ref 0.7–4.0)
MCH: 30.2 pg (ref 26.0–34.0)
MCHC: 29.8 g/dL — ABNORMAL LOW (ref 30.0–36.0)
MCV: 101.1 fL — ABNORMAL HIGH (ref 80.0–100.0)
Monocytes Absolute: 0.8 K/uL (ref 0.1–1.0)
Monocytes Relative: 7 %
Neutro Abs: 8.7 K/uL — ABNORMAL HIGH (ref 1.7–7.7)
Neutrophils Relative %: 75 %
Platelets: 253 K/uL (ref 150–400)
RBC: 3.68 MIL/uL — ABNORMAL LOW (ref 3.87–5.11)
RDW: 13.1 % (ref 11.5–15.5)
WBC: 11.6 K/uL — ABNORMAL HIGH (ref 4.0–10.5)
nRBC: 0 % (ref 0.0–0.2)

## 2024-04-23 LAB — EXPECTORATED SPUTUM ASSESSMENT W GRAM STAIN, RFLX TO RESP C: Special Requests: NORMAL

## 2024-04-23 LAB — GLUCOSE, CAPILLARY
Glucose-Capillary: 173 mg/dL — ABNORMAL HIGH (ref 70–99)
Glucose-Capillary: 144 mg/dL — ABNORMAL HIGH (ref 70–99)
Glucose-Capillary: 126 mg/dL — ABNORMAL HIGH (ref 70–99)
Glucose-Capillary: 199 mg/dL — ABNORMAL HIGH (ref 70–99)

## 2024-04-23 LAB — BASIC METABOLIC PANEL WITH GFR
Anion gap: 8 (ref 5–15)
BUN: 17 mg/dL (ref 8–23)
CO2: 42 mmol/L — ABNORMAL HIGH (ref 22–32)
Calcium: 9.1 mg/dL (ref 8.9–10.3)
Chloride: 93 mmol/L — ABNORMAL LOW (ref 98–111)
Creatinine, Ser: 0.71 mg/dL (ref 0.44–1.00)
GFR, Estimated: >60 mL/min
Glucose, Bld: 125 mg/dL — ABNORMAL HIGH (ref 70–99)
Potassium: 3.8 mmol/L (ref 3.5–5.1)
Sodium: 143 mmol/L (ref 135–145)

## 2024-04-23 NOTE — Telephone Encounter (Signed)
 3rd no show appointment sch for today at 9:00am

## 2024-04-23 NOTE — Progress Notes (Signed)
 "  [facility] PROGRESS NOTE   Michele Owens FMW:[id number] DOB: 23-May-1960 DOA: 04/20/2024  PCP: [name], MD  Brief History: 64 year old with past medical history significant for chronic diastolic heart failure, COPD, chronic hypoxic respiratory failure on 2 L of oxygen , diabetes type 2 who presents with worsening shortness of breath found to have severe sepsis due to pneumonia complicated with acute on chronic hypoxic respiratory failure.   Of note patient had a recent hospitalization from 03/23/2024 to 03/25/2024 for pneumonia treated with antibiotics.  Presented again 12/26: Complaining of worsening shortness of breath at that time discharged on doxycycline  as an outpatient.   She presented with worsening shortness of breath over the last week associated with increased cough. Evaluation in the ED consistent with COVID RSV PCR negative, chest x-ray showed interval worsening right middle lobe infiltrate concerning for worsening pneumonia.  Consultants: Pulmonology  Procedures: None    Subjective/Interval History: Patient mentions that she continues to have difficulty breathing.  Continues to have cough.  No chest pain.  No nausea vomiting.    Assessment/Plan:  Severe sepsis due to healthcare associated pneumonia: Patient with worsening cough, shortness of breath.  Chest x-ray showed worsening right middle lobe infiltrate.  RSV influenza and COVID-19 PCR negative.  Mycoplasma IgM was negative.  Strep pneumo urinary antigen and Legionella antigens were negative. Patient's WBC has improved.  Patient remains on vancomycin  and cefepime . Pulmonology has been consulted to assist with management. CT chest was done which revealed patchy area of air spaces density in the right upper lobe as well as faint diffuse ground glass densities in the left lung.  Right middle lobe nodule was also noted. Continue antibiotics.  Await further clinical improvement.   Acute on  chronic hypoxic respiratory failure - On chronic 2 L of oxygen  at home, currently requiring 3 L -Continue Anoro Ellipta , albuterol  as needed -Continue Pulmicort    Chronic diastolic heart failure - Diuretics were held during last hospitalization due to AKI.  This was supposed to be resumed by PCP on follow-up. - Resumed  torsemide  on 1/12 : 40 mg BID.    History of COPD - Continue Anoro Ellipta , albuterol , Pulmicort    Diabetes type 2: - Continue sliding scale insulin    Hyperlipidemia: - Continue with fenofibrate  and Crestor    Acquired hypothyroidism - Continue Synthroid    Allergic rhinitis - Continue Claritin    Chronic pain: Resume gabapentin  Anxiety: Continue  BuSpar , latuda .    Class III obesity Estimated body mass index is 56.39 kg/m as calculated from the following:   Height as of this encounter: 5' 5 (1.651 m).   Weight as of this encounter: 153.7 kg.  DVT Prophylaxis: Lovenox  Code Status: Full code Family Communication: Discussed with patient Disposition Plan: Home when improved     Medications: Scheduled:  budesonide   0.25 mg Nebulization BID   busPIRone   30 mg Oral BID   enoxaparin  (LOVENOX ) injection  70 mg Subcutaneous Q24H   fenofibrate   54 mg Oral Daily   gabapentin   100 mg Oral BID   guaiFENesin   1,200 mg Oral BID   insulin  aspart  0-5 Units Subcutaneous QHS   insulin  aspart  0-9 Units Subcutaneous TID WC   levothyroxine   125 mcg Oral QHS   loratadine   10 mg Oral QHS   lurasidone   20 mg Oral QHS   rosuvastatin   10 mg Oral QPM   torsemide   40 mg Oral BID   umeclidinium-vilanterol  1 puff Inhalation Daily   Vitamin D  (  Ergocalciferol )  50,000 Units Oral Q7 days   Continuous:  ceFEPime  (MAXIPIME ) IV 2 g (04/23/24 0522)   vancomycin  2,000 mg (04/22/24 2201)   PRN:acetaminophen  **OR** acetaminophen , albuterol , benzonatate , HYDROcodone -acetaminophen , melatonin, ondansetron  (ZOFRAN ) IV, traZODone   Antibiotics: Anti-infectives (From admission,  onward)    Start     Dose/Rate Route Frequency Ordered Stop   04/22/24 2200  vancomycin  (VANCOREADY) IVPB 2000 mg/400 mL        2,000 mg 200 mL/hr over 120 Minutes Intravenous Every 24 hours 04/22/24 0712     04/20/24 2200  vancomycin  (VANCOREADY) IVPB 1750 mg/350 mL  Status:  Discontinued        1,750 mg 175 mL/hr over 120 Minutes Intravenous Every 24 hours 04/20/24 0530 04/22/24 0712   04/20/24 1000  ceFEPIme  (MAXIPIME ) 2 g in sodium chloride  0.9 % 100 mL IVPB        2 g 200 mL/hr over 30 Minutes Intravenous Every 8 hours 04/20/24 0530     04/20/24 0345  vancomycin  (VANCOREADY) IVPB 2000 mg/400 mL        2,000 mg 200 mL/hr over 120 Minutes Intravenous  Once 04/20/24 0330 04/20/24 0628   04/20/24 0330  vancomycin  (VANCOCIN ) IVPB 1000 mg/200 mL premix  Status:  Discontinued        1,000 mg 200 mL/hr over 60 Minutes Intravenous  Once 04/20/24 0328 04/20/24 0330   04/20/24 0330  ceFEPIme  (MAXIPIME ) 2 g in sodium chloride  0.9 % 100 mL IVPB        2 g 200 mL/hr over 30 Minutes Intravenous  Once 04/20/24 0328 04/20/24 0428       Objective:  Vital Signs  Vitals:   04/23/24 0600 04/23/24 0751 04/23/24 0802 04/23/24 1155  BP: 127/86  130/61 (!) 141/76  Pulse: 64   88  Resp: 19  18 17   Temp: 98.5 F (36.9 C)  98.6 F (37 C) 98.2 F (36.8 C)  TempSrc: Oral  Oral Oral  SpO2: 91% 92%  90%  Weight: (!) 153.7 kg     Height:        Intake/Output Summary (Last 24 hours) at 04/23/2024 1320 Last data filed at 04/23/2024 1130 Gross per 24 hour  Intake 1140 ml  Output 2600 ml  Net -1460 ml   Filed Weights   04/21/24 0342 04/22/24 0500 04/23/24 0600  Weight: (!) 155.7 kg (!) 154.2 kg (!) 153.7 kg    General appearance: Awake alert.  In no distress Resp: Mildly tachypneic at rest.  Coarse breath sounds bilaterally.  Crackles at the bases.  No wheezing or rhonchi Cardio: S1-S2 is normal regular.  No S3-S4.  No rubs murmurs or bruit GI: Abdomen is soft.  Nontender nondistended.   Bowel sounds are present normal.  No masses organomegaly Extremities: No edema.  Full range of motion of lower extremities. Neurologic: Alert and oriented x3.  No focal neurological deficits.    Lab Results:  Data Reviewed: I have personally reviewed following labs and reports of the imaging studies  CBC: Recent Labs  Lab 04/20/24 0157 04/21/24 0912 04/23/24 0506  WBC 12.1* 13.6* 11.6*  NEUTROABS  --   --  8.7*  HGB 11.2* 10.5* 11.1*  HCT 37.4 35.3* 37.2  MCV 103.3* 103.2* 101.1*  PLT 253 227 253    Basic Metabolic Panel: Recent Labs  Lab 04/20/24 0157 04/22/24 0411 04/23/24 0506  NA 140 141 143  K 4.9 3.9 3.8  CL 96* 92* 93*  CO2 35* 40* 42*  GLUCOSE  153* 132* 125*  BUN 20 12 17   CREATININE 1.10* 0.71 0.71  CALCIUM  7.9* 8.6* 9.1  MG 1.9  --   --   PHOS 4.2  --   --     GFR: Estimated Creatinine Clearance: 107.3 mL/min (by C-G formula based on SCr of 0.71 mg/dL).  Liver Function Tests: Recent Labs  Lab 04/20/24 0157  AST 29  ALT 24  ALKPHOS 56  BILITOT 0.2  PROT 7.2  ALBUMIN  4.2    BNP (last 3 results) Recent Labs    01/16/24 2043 03/31/24 2042 04/20/24 0157  PROBNP 183.0 183.0 257.0   CBG: Recent Labs  Lab 04/22/24 1256 04/22/24 1636 04/22/24 2055 04/23/24 0800 04/23/24 1153  GLUCAP 216* 146* 127* 173* 144*    Recent Results (from the past 240 hours)  Resp panel by RT-PCR (RSV, Flu A&B, Covid) Anterior Nasal Swab     Status: None   Collection Time: 04/20/24  1:20 AM   Specimen: Anterior Nasal Swab  Result Value Ref Range Status   SARS Coronavirus 2 by RT PCR NEGATIVE NEGATIVE Final   Influenza A by PCR NEGATIVE NEGATIVE Final   Influenza B by PCR NEGATIVE NEGATIVE Final    Comment: (NOTE) The Xpert Xpress SARS-CoV-2/FLU/RSV plus assay is intended as an aid in the diagnosis of influenza from Nasopharyngeal swab specimens and should not be used as a sole basis for treatment. Nasal washings and aspirates are unacceptable for Xpert  Xpress SARS-CoV-2/FLU/RSV testing.  Fact Sheet for Patients: bloggercourse.com  Fact Sheet for Healthcare Providers: seriousbroker.it  This test is not yet approved or cleared by the [place]  FDA and has been authorized for detection and/or diagnosis of SARS-CoV-2 by FDA under an Emergency Use Authorization (EUA). This EUA will remain in effect (meaning this test can be used) for the duration of the COVID-19 declaration under Section 564(b)(1) of the Act, 21 U.S.C. section 360bbb-3(b)(1), unless the authorization is terminated or revoked.     Resp Syncytial Virus by PCR NEGATIVE NEGATIVE Final    Comment: (NOTE) Fact Sheet for Patients: bloggercourse.com  Fact Sheet for Healthcare Providers: seriousbroker.it  This test is not yet approved or cleared by the [place]  FDA and has been authorized for detection and/or diagnosis of SARS-CoV-2 by FDA under an Emergency Use Authorization (EUA). This EUA will remain in effect (meaning this test can be used) for the duration of the COVID-19 declaration under Section 564(b)(1) of the Act, 21 U.S.C. section 360bbb-3(b)(1), unless the authorization is terminated or revoked.  Performed at [facility], [address] [place], [place] [address]   Blood culture (routine x 2)     Status: None (Preliminary result)   Collection Time: 04/20/24  4:05 AM   Specimen: BLOOD  Result Value Ref Range Status   Specimen Description BLOOD BLOOD LEFT ARM  Final   Special Requests   Final    BOTTLES DRAWN AEROBIC AND ANAEROBIC Blood Culture results may not be optimal due to an inadequate volume of blood received in culture bottles   Culture   Final    NO GROWTH 3 DAYS Performed at [facility], [address] [place], [place] [address]    Report Status PENDING  Incomplete  Blood culture (routine x 2)     Status: None (Preliminary  result)   Collection Time: 04/20/24  4:12 AM   Specimen: BLOOD LEFT ARM  Result Value Ref Range Status   Specimen Description BLOOD LEFT ARM  Final  Special Requests   Final    BOTTLES DRAWN AEROBIC AND ANAEROBIC Blood Culture results may not be optimal due to an inadequate volume of blood received in culture bottles   Culture   Final    NO GROWTH 3 DAYS Performed at [facility], [address], [place], [place] [address]    Report Status PENDING  Incomplete  MRSA Next Gen by PCR, Nasal     Status: None   Collection Time: 04/20/24  4:45 AM   Specimen: Nasal Mucosa; Nasal Swab  Result Value Ref Range Status   MRSA by PCR Next Gen NOT DETECTED NOT DETECTED Final    Comment: (NOTE) The GeneXpert MRSA Assay (FDA approved for NASAL specimens only), is one component of a comprehensive MRSA colonization surveillance program. It is not intended to diagnose MRSA infection nor to guide or monitor treatment for MRSA infections. Test performance is not FDA approved in patients less than 48 years old. Performed at [facility], [address] [place], [place] [address]       Radiology Studies: CT CHEST WO CONTRAST Result Date: 04/22/2024 CLINICAL DATA:  Recent pneumonia. EXAM: CT CHEST WITHOUT CONTRAST TECHNIQUE: Multidetector CT imaging of the chest was performed following the standard protocol without IV contrast. RADIATION DOSE REDUCTION: This exam was performed according to the departmental dose-optimization program which includes automated exposure control, adjustment of the mA and/or kV according to patient size and/or use of iterative reconstruction technique. COMPARISON:  Chest CT dated 03/23/2024. FINDINGS: Evaluation of this exam is limited in the absence of intravenous contrast. Cardiovascular: There is no cardiomegaly or pericardial effusion. Mild atherosclerotic calcification of the thoracic aorta. No aneurysmal dilatation. The central pulmonary arteries are grossly  unremarkable. Mediastinum/Nodes: No hilar or mediastinal adenopathy. The esophagus is grossly unremarkable. No mediastinal fluid collection. Lungs/Pleura: There is a 12 mm right middle lobe nodule, present on the prior CT. Follow-up as per recommendation of prior CT. Bilateral streaky atelectasis. Patchy area of airspace density in the right upper lobe as well as faint diffuse ground-glass density in the left lung concerning for pneumonia. No pleural effusion or pneumothorax. The central airways are patent. Upper Abdomen: No acute abnormality. Musculoskeletal: Osteopenia with degenerative changes of the spine. No acute osseous pathology. IMPRESSION: 1. Patchy area of airspace density in the right upper lobe as well as faint diffuse ground-glass density in the left lung concerning for pneumonia. 2. A 12 mm right middle lobe nodule. Follow-up as per recommendation of prior CT. 3.  Aortic Atherosclerosis (ICD10-I70.0). Electronically Signed   By: Vanetta Chou M.D.   On: 04/22/2024 15:19       LOS: 3 days   Gaven Eugene  [facility] Pager on [email or web address]  04/23/2024, 1:20 PM   "

## 2024-04-23 NOTE — Evaluation (Addendum)
 Physical Therapy Evaluation Patient Details Name: Michele Owens MRN: [id number] DOB: 1961-02-17 Today's Date: 04/23/2024  History of Present Illness  64 y.o. presents to [facility] 04/20/24 with worsening SOB/cough. Admitted with severe sepsis 2/2 PNA complicated with acute on chronic hypoxic respiratory failure. Prior admit 12/14-12/16 for PNA. PMHx: obesity, neuropathy, HTN, OSA, COPD, hypoxic RF on 2L [place], CHF, gout, bipolar, SVT, migraines GERD, hx of DVT, anxiety depression   HFpEF, asthma,tobacco use, HLD, pulmonary edema, thyroid  CA, anasarca   Clinical Impression  PTA pt was ModI for mobility with use of RW. Pt reported hx of multiple falls due to BLE buckling. Pt presents with decreased activity tolerance, impaired balance, and limited endurance. Pt required MinA for bed mobility and supervision to step-pivot towards BSC with use of RW. Pt requested to have privacy with pt agreeable to use call bell when finished. Pt has intermittent assist from PCA upon return home. Recommending OP PT to reduce falls risk and work on functional impairments. Acute PT to follow.         If plan is discharge home, recommend the following: A little help with walking and/or transfers;Assist for transportation;Help with stairs or ramp for entrance   Can travel by private vehicle    Yes    Equipment Recommendations None recommended by PT     Functional Status Assessment Patient has had a recent decline in their functional status and demonstrates the ability to make significant improvements in function in a reasonable and predictable amount of time.     Precautions / Restrictions Precautions Precautions: Fall Recall of Precautions/Restrictions: Intact Restrictions Weight Bearing Restrictions Per Provider Order: No      Mobility  Bed Mobility Overal bed mobility: Needs Assistance Bed Mobility: Supine to Sit    Supine to sit: Min assist, HOB elevated    General bed mobility comments: pt requesting  assist for R LE management with slight assist to bring off EOB    Transfers Overall transfer level: Needs assistance Equipment used: Rolling walker (2 wheels) Transfers: Sit to/from Stand, Bed to chair/wheelchair/BSC Sit to Stand: Supervision Stand pivot transfers: Supervision    General transfer comment: supervision for safety    Ambulation/Gait Ambulation/Gait assistance: Supervision Gait Distance (Feet): 4 Feet Assistive device: Rolling walker (2 wheels) Gait Pattern/deviations: Step-through pattern, Decreased stride length Gait velocity: decr    General Gait Details: Shuffling steps towards BSC with increased medial/lateral sway.    Balance Overall balance assessment: Mild deficits observed, not formally tested        Pertinent Vitals/Pain Pain Assessment Pain Assessment: No/denies pain    Home Living Family/patient expects to be discharged to:: Private residence Living Arrangements: Alone Available Help at Discharge: Personal care attendant Type of Home: Apartment Home Access: Level entry      Home Layout: One level Home Equipment: Agricultural Consultant (2 wheels);Rollator (4 wheels);Cane - single point;Wheelchair - manual Additional Comments: has an aide M-F for 2 hours; wears 2L O2 baseline, reports turning it up to 5L when SOB but does not regularly check SPO2 at these times    Prior Function Prior Level of Function : Needs assist    Mobility Comments: Mod I with RW, multiple falls with pt reporting BLE buckling ADLs Comments: Ind with UB ADLs, grooming and toileting, aide assists with bathiing, LB ADLs, socks and shoes. Pt reports that she does her own grocery shopping and cooking with transportation assist     Extremity/Trunk Assessment   Upper Extremity Assessment Upper Extremity Assessment: Defer  to OT evaluation    Lower Extremity Assessment Lower Extremity Assessment: Generalized weakness    Cervical / Trunk Assessment Cervical / Trunk Assessment:  Other exceptions Cervical / Trunk Exceptions: increased body habitus  Communication   Communication Communication: No apparent difficulties    Cognition Arousal: Alert Behavior During Therapy: Flat affect   PT - Cognitive impairments: Safety/Judgement    Following commands: Intact       Cueing Cueing Techniques: Verbal cues      PT Assessment Patient needs continued PT services  PT Problem List Decreased strength;Decreased balance;Decreased activity tolerance;Decreased mobility;Decreased safety awareness       PT Treatment Interventions DME instruction;Gait training;Functional mobility training;Therapeutic activities;Balance training;Therapeutic exercise;Neuromuscular re-education;Patient/family education    PT Goals (Current goals can be found in the Care Plan section)  Acute Rehab PT Goals Patient Stated Goal: to feel better PT Goal Formulation: With patient Time For Goal Achievement: 05/07/24 Potential to Achieve Goals: Good    Frequency Min 2X/week        AM-PAC PT 6 Clicks Mobility  Outcome Measure Help needed turning from your back to your side while in a flat bed without using bedrails?: None Help needed moving from lying on your back to sitting on the side of a flat bed without using bedrails?: A Little Help needed moving to and from a bed to a chair (including a wheelchair)?: A Little Help needed standing up from a chair using your arms (e.g., wheelchair or bedside chair)?: A Little Help needed to walk in hospital room?: A Lot Help needed climbing 3-5 steps with a railing? : Total 6 Click Score: 16    End of Session   Activity Tolerance: Patient tolerated treatment well Patient left: with call bell/phone within reach;Other (comment) (on [facility]) Nurse Communication: Mobility status PT Visit Diagnosis: Unsteadiness on feet (R26.81);Other abnormalities of gait and mobility (R26.89);Muscle weakness (generalized) (M62.81);History of falling (Z91.81)     Time: 9083-9072 PT Time Calculation (min) (ACUTE ONLY): 11 min   Charges:   PT Evaluation $PT Eval Low Complexity: 1 Low   PT General Charges $$ ACUTE PT VISIT: 1 Visit        Kate ORN, PT, DPT Secure Chat Preferred  Rehab Office [phone]   Kate BRAVO Wendolyn 04/23/2024, 9:59 AM

## 2024-04-23 NOTE — Progress Notes (Incomplete)
 " Assessment & Plan   Assessment/Plan:    Problem List Items Addressed This Visit   None       Assessment and Plan               There are no discontinued medications.  No follow-ups on file.        Subjective:   Encounter date: 04/23/2024  Michele Owens is a 64 y.o. female who has Tobacco abuse; OSA (obstructive sleep apnea); Non-insulin  dependent type 2 diabetes mellitus (HCC); Leukocytosis; DM2 (diabetes mellitus, type 2) (HCC); Depression with anxiety; Benign essential HTN; Normocytic anemia; Chest pain; Obesity hypoventilation syndrome (HCC); CAP (community acquired pneumonia); Multinodular goiter w/ dominant right thyroid  nodule; Pulmonary hypertension (HCC); Chronic hypoxic respiratory failure (HCC); Acute hypoxemic respiratory failure (HCC); Acute pulmonary edema (HCC); HLD (hyperlipidemia); COPD (chronic obstructive pulmonary disease) (HCC); Gout; DVT (deep vein thrombosis) in pregnancy; Thyroid  cancer (HCC); Hypocalcemia; Hypomagnesemia; Atypical chest pain; Family history of thyroid  cancer; Encounter for geriatric assessment; COPD exacerbation (HCC); Acute and chronic respiratory failure with hypercapnia (HCC); Essential hypertension; Obesity, class 3 (HCC); Fall at home, initial encounter; HNP (herniated nucleus pulposus), thoracic; Morbid obesity with BMI of 50.0-59.9, adult (HCC); Acute on chronic hypoxic respiratory failure (HCC); Hemoptysis; Encephalopathy; Chronic diastolic CHF (congestive heart failure) (HCC); Severe sepsis (HCC); Respiratory failure with hypercapnia (HCC); Anemia; Acute kidney injury superimposed on chronic kidney disease; Acquired hypothyroidism; Chronic pain disorder; Fever; Current mild episode of major depressive disorder without prior episode; Diabetic peripheral neuropathy (HCC); Anxiety; Acute on chronic diastolic CHF (congestive heart failure) (HCC); Acute respiratory failure with hypoxia and hypercapnia (HCC); Acute respiratory failure  with hypercapnia (HCC); Delirium due to multiple etiologies, acute, hyperactive; At risk for polypharmacy; Dependence on continuous supplemental oxygen ; History of COPD; Bipolar disorder (HCC); Cellulitis; Hypokalemia; Vitamin D  deficiency; Chronic idiopathic constipation; Mild episode of recurrent major depressive disorder; GAD (generalized anxiety disorder); Former smoker; 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   She  has a past medical history of Anxiety, Arthritis, Asthma, Chronic diastolic CHF (congestive heart failure) (HCC), COPD (chronic obstructive pulmonary disease) (HCC), Depression, Diabetes mellitus without complication (HCC), GERD (gastroesophageal reflux disease), Gout, Headache, History of DVT of lower extremity, Hypertension, Morbid obesity with BMI of 45.0-49.9, adult (HCC), and Thyroid  cancer (HCC) (09/23/2016)..   She presents with chief complaint of No chief complaint on file. .   Discussed the use of AI scribe software for clinical note transcription with the patient, who gave verbal consent to proceed.  History of Present Illness           Hospital Admission 03/23/2024 - 03/25/24 - 03/31/24 - 04/20/2024 - She was seen in the hospital for severe sepsis due to HCAP pneumonia complicated by acute on chronic hypoxic respiratory failure.   - Treated with IV antibiotics, discharged before returning with new PNA being treated with Doxycycline .  - She is on a 2 L nasal cannula, with history of CHF, and COPD.  - Notable vital signs in the ED: afebrile; respirate 1725; systolic blood pressures low 100s; oxygen  saturation initially 88% on her baseline 2 L of cannula so is increasing  the range of 93 to 94% on 3 L nasal cannula. - Labs with CMP notable for potassium 4.9, creatinine 1.1, glucose 153, liver enzymes within limits.  proBNP 257.  0.830 03/31/2024, urinalysis notable for no white blood cells, nitrate negative, leukocyte esterase negative.  CBC overall with cell count 12 is 100 hemoglobin 11.2 compared to 12.2 on 03/31/2024.  Blood cultures x 2 collected prior to initiation of antibiotics.  COVID analysis, RSV PCR negative. - EKG in ED demonstrated sinus rhythm heart rate 79, normal intervals, no evidence of T wave or ST changes, including axis to the patient. - CXR imaging noted interval worsening of right middle lobe infiltrate concerning for worsening pneumonia, will demonstrate no evidence of edema, effusion, or pneumothorax.  Diabetes Mellitus, type II - Last hgbA1c 6.9% in 01/2024  Hypertension - Managed with losartan  25 mg daily, carvedilol  6.25 mg BID, and torsemide  40 mg BID. - Last blood pressure within goal at 130/61 mmHg.   Anxiety and Depression - Managed with Latuda  20 mg at bedtime, trazodone  300 mg at bedtime, buspirone  30 mg BID.   ROS  Past Surgical History:  Procedure Laterality Date   BUNIONECTOMY Bilateral    CARDIAC CATHETERIZATION N/A 06/23/2015   Procedure: Right/Left Heart Cath and Coronary Angiography;  Surgeon: Ezra GORMAN Shuck, MD;  Location: [facility] INVASIVE CV LAB;  Service: Cardiovascular;  Laterality: N/A;   COLONOSCOPY WITH PROPOFOL  N/A 12/15/2015   Procedure: COLONOSCOPY WITH PROPOFOL ;  Surgeon: Norleen Hint, MD;  Location: [facility] ENDOSCOPY;  Service: Endoscopy;  Laterality: N/A;   RIGHT HEART CATH N/A 10/01/2019   Procedure: RIGHT HEART CATH;  Surgeon: Shuck Ezra GORMAN, MD;  Location: [facility] INVASIVE CV LAB;  Service: Cardiovascular;  Laterality: N/A;   RIGHT HEART CATH N/A 03/03/2024   Procedure: RIGHT HEART CATH;  Surgeon: Shuck Ezra GORMAN, MD;  Location: [facility] INVASIVE CV LAB;  Service: Cardiovascular;  Laterality: N/A;   THYROIDECTOMY   09/19/2016   THYROIDECTOMY N/A 09/19/2016   Procedure: TOTAL THYROIDECTOMY;  Surgeon: Eletha Boas, MD;  Location: [facility] OR;  Service: General;  Laterality: N/A;   TONSILLECTOMY     TOTAL ABDOMINAL HYSTERECTOMY  07/14/10    Medications Ordered Prior to Encounter[1]  Family History  Problem Relation Age of Onset   Cancer Father        thought to be due to exposure to concrete   Diabetes Mother    Thyroid  cancer Mother        dx in her 17s-60s   Cancer Maternal Uncle        2 uncles with cancer NOS   Brain cancer Paternal Aunt    Cancer Cousin        maternal first cousin - NOS   Cancer Cousin        maternal first cousin - NOS    Social History   Socioeconomic History   Marital status: Single    Spouse name: Not on file   Number of children: 1   Years of education: Not on file   Highest education level: Not on file  Occupational History   Occupation: disabled  Tobacco Use   Smoking status: Former    Current packs/day: 0.00    Average packs/day: 0.5 packs/day for 41.0 years (20.5 ttl pk-yrs)    Types: Cigarettes    Start date: 07/1975    Quit date: 07/2016    Years since quitting: 7.7   Smokeless tobacco: Never   Tobacco comments:    Pt stop smoking 07/04/2011  Vaping Use   Vaping status: Never Used  Substance and Sexual Activity   Alcohol use: Not Currently    Comment: quit 12 years ago  Drug use: Not Currently    Types: Marijuana, Cocaine    Comment: quit 12 years ago   Sexual activity: Yes  Other Topics Concern   Not on file  Social History Narrative   Not on file   Social Drivers of Health   Tobacco Use: Medium Risk (04/20/2024)   Patient History    Smoking Tobacco Use: Former    Smokeless Tobacco Use: Never    Passive Exposure: Not on file  Financial Resource Strain: Low Risk (10/24/2022)   Overall Financial Resource Strain (CARDIA)    Difficulty of Paying Living Expenses: Not very hard  Food Insecurity: No Food Insecurity (04/20/2024)   Epic     Worried About Programme Researcher, Broadcasting/film/video in the Last Year: Never true    Ran Out of Food in the Last Year: Never true  Transportation Needs: Unmet Transportation Needs (04/20/2024)   Epic    Lack of Transportation (Medical): Yes    Lack of Transportation (Non-Medical): Yes  Physical Activity: Not on file  Stress: Not on file  Social Connections: Moderately Isolated (04/20/2024)   Social Connection and Isolation Panel    Frequency of Communication with Friends and Family: More than three times a week    Frequency of Social Gatherings with Friends and Family: More than three times a week    Attends Religious Services: 1 to 4 times per year    Active Member of [employer] or Organizations: No    Attends Banker Meetings: Never    Marital Status: Divorced  Catering Manager Violence: Not At Risk (04/20/2024)   Epic    Fear of Current or Ex-Partner: No    Emotionally Abused: No    Physically Abused: No    Sexually Abused: No  Depression (PHQ2-9): Medium Risk (12/25/2023)   Depression (PHQ2-9)    PHQ-2 Score: 9  Alcohol Screen: Low Risk (10/24/2022)   Alcohol Screen    Last Alcohol Screening Score (AUDIT): 0  Housing: High Risk (04/20/2024)   Epic    Unable to Pay for Housing in the Last Year: Yes    Number of Times Moved in the Last Year: 0    Homeless in the Last Year: No  Utilities: Not At Risk (04/20/2024)   Epic    Threatened with loss of utilities: No  Health Literacy: Not on file                                                                                                  Objective:  Physical Exam: There were no vitals taken for this visit.   Physical Exam           Physical Exam  CT CHEST WO CONTRAST Result Date: 04/22/2024 CLINICAL DATA:  Recent pneumonia. EXAM: CT CHEST WITHOUT CONTRAST TECHNIQUE: Multidetector CT imaging of the chest was performed following the standard protocol without IV contrast. RADIATION DOSE REDUCTION: This exam was performed according to  the departmental dose-optimization program which includes automated exposure control, adjustment of the mA and/or kV according to patient size and/or use of iterative reconstruction technique. COMPARISON:  Chest  CT dated 03/23/2024. FINDINGS: Evaluation of this exam is limited in the absence of intravenous contrast. Cardiovascular: There is no cardiomegaly or pericardial effusion. Mild atherosclerotic calcification of the thoracic aorta. No aneurysmal dilatation. The central pulmonary arteries are grossly unremarkable. Mediastinum/Nodes: No hilar or mediastinal adenopathy. The esophagus is grossly unremarkable. No mediastinal fluid collection. Lungs/Pleura: There is a 12 mm right middle lobe nodule, present on the prior CT. Follow-up as per recommendation of prior CT. Bilateral streaky atelectasis. Patchy area of airspace density in the right upper lobe as well as faint diffuse ground-glass density in the left lung concerning for pneumonia. No pleural effusion or pneumothorax. The central airways are patent. Upper Abdomen: No acute abnormality. Musculoskeletal: Osteopenia with degenerative changes of the spine. No acute osseous pathology. IMPRESSION: 1. Patchy area of airspace density in the right upper lobe as well as faint diffuse ground-glass density in the left lung concerning for pneumonia. 2. A 12 mm right middle lobe nodule. Follow-up as per recommendation of prior CT. 3.  Aortic Atherosclerosis (ICD10-I70.0). Electronically Signed   By: Vanetta Chou M.D.   On: 04/22/2024 15:19   DG Swallowing Func-Speech Pathology Result Date: 04/21/2024 Table formatting from the original result was not included. Modified Barium Swallow Study Patient Details Name: Alysen Smylie MRN: [id number] Date of Birth: 21-Nov-1960 Today's Date: 04/21/2024 HPI/PMH: HPI: Cashae Weich is a 64 y.o. female who is admitted to [facility] on 04/20/2024 with severe sepsis due to HCAP pneumonia complicated by acute on  chronic hypoxic respiratory failure after presenting from home to [facility] ED complaining of shortness of breath. Pt with recent admissions for pna x2 in Decembers.  She reports additional cases of pna over the past year including summer time. CXR 1/11:  Progressive consolidation within the right mid lung zone. Pt with medical history significant for chronic diastolic heart failure, COPD, chronic Evoxac respiratory failure on 2 L continuous nasal cannula, type 2 diabetes mellitus. Clinical Impression: Pt presents with grossly normal swallow function.  There was trace, transient penetration of thin liquids only, which is [facility].  There was no aspiration of any consistencies trialed.  There was trace residue. Esophageal sweep could not be completed 2/2 use of C-Arm unit to complete this study.  There was no retention or backflow of contrast in cervical esophagus. Recommend continuing regular texture diet with thin liquids. DIGEST Swallow Severity Rating*  Safety: 0  Efficiency: 0  Overall Pharyngeal Swallow Severity: 0 1: mild; 2: moderate; 3: severe; 4: profound *The Dynamic Imaging Grade of Swallowing Toxicity is standardized for the head and neck cancer population, however, demonstrates promising clinical applications across populations to standardize the clinical rating of pharyngeal swallow safety and severity. Factors that may increase risk of adverse event in presence of aspiration Noe & Lianne 2021): No data recorded Recommendations/Plan: Swallowing Evaluation Recommendations Swallowing Evaluation Recommendations Recommendations: PO diet PO Diet Recommendation: Regular; Thin liquids (Level 0) Liquid Administration via: Cup; Straw Medication Administration: Whole meds with liquid Supervision: Patient able to self-feed Postural changes: Position pt fully upright for meals Oral care recommendations: Staff/trained caregiver to provide oral care Treatment Plan Treatment Plan Treatment recommendations: Therapy as  outlined in treatment plan below Follow-up recommendations: No SLP follow up Functional status assessment: Patient has not had a recent decline in their functional status. Treatment frequency: -- (N/A) Recommendations Recommendations for follow up therapy are one component of a multi-disciplinary discharge planning process, led by the attending physician.  Recommendations may be updated based on patient  status, additional functional criteria and insurance authorization. Assessment: Orofacial Exam: Orofacial Exam Oral Cavity: Oral Hygiene: WFL Oral Cavity - Dentition: Adequate natural dentition; Missing dentition Orofacial Anatomy: WFL Oral Motor/Sensory Function: -- (See BSE) Anatomy: Anatomy: WFL Boluses Administered: Boluses Administered Boluses Administered: Thin liquids (Level 0); Puree; Solid  Oral Impairment Domain: Oral Impairment Domain Lip Closure: No labial escape Tongue control during bolus hold: Not tested Bolus preparation/mastication: Timely and efficient chewing and mashing Bolus transport/lingual motion: Brisk tongue motion Oral residue: Trace residue lining oral structures Location of oral residue : Tongue Initiation of pharyngeal swallow : Posterior laryngeal surface of the epiglottis  Pharyngeal Impairment Domain: Pharyngeal Impairment Domain Soft palate elevation: No bolus between soft palate (SP)/pharyngeal wall (PW) Laryngeal elevation: Complete superior movement of thyroid  cartilage with complete approximation of arytenoids to epiglottic petiole Anterior hyoid excursion: Complete anterior movement Epiglottic movement: Complete inversion Laryngeal vestibule closure: Complete, no air/contrast in laryngeal vestibule Pharyngeal stripping wave : Present - complete Pharyngoesophageal segment opening: Complete distension and complete duration, no obstruction of flow Tongue base retraction: Trace column of contrast or air between tongue base and PPW Pharyngeal residue: Trace residue within or on  pharyngeal structures Location of pharyngeal residue: Valleculae  Esophageal Impairment Domain: Esophageal Impairment Domain Esophageal clearance upright position: -- (unable to assess) Pill: Pill Consistency administered: Thin liquids (Level 0) Penetration/Aspiration Scale Score: Penetration/Aspiration Scale Score 1.  Material does not enter airway: Puree; Solid; Pill 2.  Material enters airway, remains ABOVE vocal cords then ejected out: Thin liquids (Level 0) Compensatory Strategies: Compensatory Strategies Compensatory strategies: No   General Information: Caregiver present: No  Diet Prior to this Study: Regular; Thin liquids (Level 0)   Temperature : Normal   Respiratory Status: WFL   Supplemental O2: Nasal cannula   History of Recent Intubation: No  Behavior/Cognition: Alert; Cooperative Self-Feeding Abilities: Able to self-feed Baseline vocal quality/speech: -- (harsh) No data recorded Volitional Swallow: Able to elicit Exam Limitations: No limitations Goal Planning: Prognosis for improved oropharyngeal function: -- (N/A) No data recorded No data recorded Patient/Family Stated Goal: no deficits/none stated Consulted and agree with results and recommendations: Patient; Physician Pain: Pain Assessment Pain Assessment: -- (none reported) End of Session: Start Time:SLP Start Time (ACUTE ONLY): 1010 Stop Time: SLP Stop Time (ACUTE ONLY): 1028 Time Calculation:SLP Time Calculation (min) (ACUTE ONLY): 18 min Charges: SLP Evaluations $ SLP Speech Visit: 1 Visit SLP Evaluations $BSS Swallow: 1 Procedure $MBS Swallow: 1 Procedure SLP visit diagnosis: SLP Visit Diagnosis: Dysphagia, unspecified (R13.10) Past Medical History: Past Medical History: Diagnosis Date  Anxiety   Arthritis   Asthma   Chronic diastolic CHF (congestive heart failure) (HCC)   COPD (chronic obstructive pulmonary disease) (HCC)   Uses Oxygen  at night  Depression   Diabetes mellitus without complication (HCC)   GERD (gastroesophageal reflux disease)    Gout   Headache   migraines  History of DVT of lower extremity   Hypertension   Morbid obesity with BMI of 45.0-49.9, adult (HCC)   Thyroid  cancer (HCC) 09/23/2016 Past Surgical History: Past Surgical History: Procedure Laterality Date  BUNIONECTOMY Bilateral   CARDIAC CATHETERIZATION N/A 06/23/2015  Procedure: Right/Left Heart Cath and Coronary Angiography;  Surgeon: Ezra GORMAN Shuck, MD;  Location: [facility] INVASIVE CV LAB;  Service: Cardiovascular;  Laterality: N/A;  COLONOSCOPY WITH PROPOFOL  N/A 12/15/2015  Procedure: COLONOSCOPY WITH PROPOFOL ;  Surgeon: Norleen Hint, MD;  Location: [facility] ENDOSCOPY;  Service: Endoscopy;  Laterality: N/A;  RIGHT HEART CATH N/A 10/01/2019  Procedure: RIGHT HEART  CATH;  Surgeon: Rolan Ezra RAMAN, MD;  Location: [facility] INVASIVE CV LAB;  Service: Cardiovascular;  Laterality: N/A;  RIGHT HEART CATH N/A 03/03/2024  Procedure: RIGHT HEART CATH;  Surgeon: Rolan Ezra RAMAN, MD;  Location: [facility] INVASIVE CV LAB;  Service: Cardiovascular;  Laterality: N/A;  THYROIDECTOMY  09/19/2016  THYROIDECTOMY N/A 09/19/2016  Procedure: TOTAL THYROIDECTOMY;  Surgeon: Eletha Boas, MD;  Location: [facility] OR;  Service: General;  Laterality: N/A;  TONSILLECTOMY    TOTAL ABDOMINAL HYSTERECTOMY  07/14/10 Anette FORBES Grippe, MA, CCC-SLP Acute Rehabilitation Services Office: [phone] 04/21/2024, 11:29 AM  DG C-Arm 1-60 Min-No Report Result Date: 04/21/2024 Fluoroscopy was utilized by the requesting physician.  No radiographic interpretation.   DG Foot 2 Views Left Result Date: 04/20/2024 EXAM: 2 VIEW(S) XRAY OF THE LEFT FOOT 04/20/2024 01:49:38 AM COMPARISON: None available. CLINICAL HISTORY: sob FINDINGS: BONES AND JOINTS: No acute fracture. No malalignment. Plantar calcaneal spur. Degenerative changes of the distal interphalangeal joint of the third toe. Degenerative changes of the tarsometatarsal joints. SOFT TISSUES: Dorsal forefoot subcutaneous soft tissue edema. IMPRESSION: 1. Dorsal forefoot subcutaneous soft tissue edema. 2.  Third ray and midfoot degenerative changes Electronically signed by: Dorethia Molt MD MD 04/20/2024 01:57 AM EST RP Workstation: [id number]   DG Chest Port 1 View Result Date: 04/20/2024 EXAM: 1 VIEW(S) XRAY OF THE CHEST 04/20/2024 01:49:38 AM COMPARISON: 03/31/2024 CLINICAL HISTORY: sob FINDINGS: LUNGS AND PLEURA: Low lung volumes. Progressive consolidation within the right mid lung zone. Grossly stable 8 mm pulmonary nodule within the peripheral right lung base, noted on CT examination of 03/23/2024. No pleural effusion. No pneumothorax. HEART AND MEDIASTINUM: Cardiomegaly. Aortic atherosclerosis. BONES AND SOFT TISSUES: No acute osseous abnormality. IMPRESSION: 1. Progressive consolidation within the right mid lung zone. 2. Follow-up chest radiograph is recommended in 3-4 weeks, following conservative therapy, to document resolution. If persistent at that time, dedicated contrast enhanced CT imaging of the chest is recommended for further evaluation. 3. Stable 8 mm pulmonary nodule within the peripheral right lung base. 4. Cardiomegaly Electronically signed by: Dorethia Molt MD MD 04/20/2024 01:54 AM EST RP Workstation: [id number]   DG Chest Portable 1 View Result Date: 03/31/2024 CLINICAL DATA:  Chest pain and shortness of breath. EXAM: PORTABLE CHEST 1 VIEW COMPARISON:  Radiograph and CT 8 days ago 03/23/2024 FINDINGS: Mild cardiomegaly stable. Mediastinal contours are unchanged. Increasing opacity in the perifissural right upper lobe. Mild patchy bibasilar opacities are similar. No pleural effusion or pneumothorax technically limited due to soft tissue attenuation from habitus. IMPRESSION: 1. Increasing opacity in the perifissural right upper lobe, suspicious for pneumonia. 2. Mild patchy bibasilar opacities are similar, favor atelectasis. Electronically Signed   By: Andrea Gasman M.D.   On: 03/31/2024 20:44   CT Angio Chest PE W and/or Wo Contrast Result Date: 03/23/2024 EXAM: CTA of the Chest  with contrast for PE 03/23/2024 05:16:34 PM TECHNIQUE: CTA of the chest was performed without and with the administration of 100 mL of iohexol  (OMNIPAQUE ) 350 MG/ML injection. Multiplanar reformatted images are provided for review. MIP images are provided for review. Automated exposure control, iterative reconstruction, and/or weight based adjustment of the mA/kV was utilized to reduce the radiation dose to as low as reasonably achievable. COMPARISON: CTA chest dated 01/16/2024 and CT chest dated 12/13/2022. CLINICAL HISTORY: Pulmonary embolism (PE) suspected, high prob. FINDINGS: PULMONARY ARTERIES: Pulmonary arteries are adequately opacified for evaluation. No pulmonary embolism. Enlargement of the main pulmonary artery, suggesting pulmonary arterial hypertension. MEDIASTINUM: Stable moderate cardiomegaly. Thoracic aortic atherosclerosis. The pericardium  demonstrates no acute abnormality. LYMPH NODES: No mediastinal, hilar or axillary lymphadenopathy. LUNGS AND PLEURA: 10 mm right middle lobe nodule (image 49), similar versus minimally progressive from recent CT, previously 4 mm in 2024. Mild linear and patchy opacities in the inferior right middle lobe, lingula, and bilateral lower lobes, favoring atelectasis/scarring. Additional subpleural patchy opacities inferiorly in the right upper lobe (image 42), suggesting mild infection/pneumonia, or possibly postinfectious/inflammatory scarring. No pleural effusion or pneumothorax. UPPER ABDOMEN: Limited images of the upper abdomen are unremarkable. SOFT TISSUES AND BONES: Degenerative changes of the visualized thoracolumbar spine. No acute soft tissue abnormality. IMPRESSION: 1. No pulmonary embolism. 2. 10 mm right middle lobe nodule, progressive from 2024, suspicious. Consider PET-CT or 3 month follow-up CT chest for further evaluation. 3. Subpleural patchy opacities inferiorly in the right upper lobe, which may reflect mild infection or postinfectious/inflammatory  scarring. 4. Additional ancillary findings, as above. Electronically signed by: Pinkie Pebbles MD 03/23/2024 06:04 PM EST RP Workstation: [id number]   CT ABDOMEN PELVIS W CONTRAST Result Date: 03/23/2024 EXAM: CT ABDOMEN AND PELVIS WITH CONTRAST 03/23/2024 05:16:34 PM TECHNIQUE: CT of the abdomen and pelvis was performed with the administration of intravenous contrast. Multiplanar reformatted images are provided for review. Automated exposure control, iterative reconstruction, and/or weight-based adjustment of the mA/kV was utilized to reduce the radiation dose to as low as reasonably achievable. COMPARISON: CT abdomen and pelvis 01/16/2024. CLINICAL HISTORY: Abdominal/flank pain, stone suspected. FINDINGS: LOWER CHEST: Atelectasis in the lung bases. There is a 9 mm right middle lobe nodule which is unchanged from prior. LIVER: The liver is enlarged with diffuse fatty infiltration. GALLBLADDER AND BILE DUCTS: Gallbladder is unremarkable. No biliary ductal dilatation. SPLEEN: No acute abnormality. PANCREAS: There is a 9 mm focal hypodensity within the neck of the pancreas image 3/30 which appears unchanged. ADRENAL GLANDS: No acute abnormality. KIDNEYS, URETERS AND BLADDER: No stones in the kidneys or ureters. No hydronephrosis. No perinephric or periureteral stranding. Urinary bladder is unremarkable. GI AND BOWEL: Stomach demonstrates no acute abnormality. The appendix appears normal. There is no bowel obstruction. PERITONEUM AND RETROPERITONEUM: No ascites. No free air. VASCULATURE: Aorta is normal in caliber. LYMPH NODES: No lymphadenopathy. REPRODUCTIVE ORGANS: The uterus is surgically absent. BONES AND SOFT TISSUES: No acute osseous abnormality. No focal soft tissue abnormality. IMPRESSION: 1. No acute findings in the abdomen or pelvis. 2. Unchanged 9 mm right middle lobe pulmonary nodule; no routine follow-up recommended per Fleischner Society Guidelines. 3. 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 Electronically signed by: Greig Pique MD 03/23/2024 05:53 PM EST RP Workstation: [id number]   DG Chest Portable 1 View Result Date: 03/23/2024 CLINICAL DATA:  dyspnea, cough EXAM: PORTABLE CHEST 1 VIEW COMPARISON:  March 12, 2024 fall January 20, 2024 FINDINGS: Evaluation is limited by underpenetration and positioning. The cardiomediastinal silhouette is unchanged enlarged in contour.Low lung volumes. No pleural effusion. No pneumothorax. No acute pleuroparenchymal abnormality. Neck surgical clips. IMPRESSION: No acute cardiopulmonary abnormality. Electronically Signed   By: Corean Salter M.D.   On: 03/23/2024 14:27   DG Chest 2 View Result Date: 03/12/2024 CLINICAL DATA:  Shortness of breath EXAM: CHEST - 2 VIEW COMPARISON:  Chest x-ray 01/21/2024 FINDINGS: Examination is limited secondary to patient positioning and overlying soft tissues. Heart appears enlarged, unchanged. There is no definite lung infiltrate, pleural effusion or pneumothorax. No acute fractures are seen. IMPRESSION: 1. Limited exam.  No definite acute cardiopulmonary process. 2. Cardiomegaly. Electronically Signed   By: Greig Pique M.D.   On: 03/12/2024 23:43   CARDIAC CATHETERIZATION Result Date: 03/03/2024 1. 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  2. Moderate pulmonary venous hypertension. 3. Preserved cardiac output Increase torsemide  to 60 mg bid and add KCl 20 daily.  BMET in 1 week.   DG Knee 2 Views Right Result Date: 02/07/2024 EXAM: 1 or 2 VIEW(S) XRAY OF THE RIGHT KNEE 02/07/2024 01:16:00 AM COMPARISON: None available. CLINICAL HISTORY: knee pain. Encounter for knee pain FINDINGS: BONES AND JOINTS: No acute fracture. No focal osseous lesion. No joint dislocation. No significant joint effusion. Mild-to-moderate degenerative changes of the knee. SOFT TISSUES: The soft tissues are unremarkable. IMPRESSION:  1. No acute abnormality. Electronically signed by: Morgane Naveau MD 02/07/2024 01:21 AM EDT RP Workstation: [id number]   DG Knee 2 Views Left Result Date: 02/07/2024 EXAM: 1 OR 2 VIEW(S) XRAY OF THE LEFT KNEE 02/07/2024 01:16:00 AM COMPARISON: 10/19/2023 CLINICAL HISTORY: knee pain. FINDINGS: BONES AND JOINTS: No acute fracture. No focal osseous lesion. No joint dislocation. No significant joint effusion. Moderate to advanced tricompartmental degenerative changes with joint space narrowing and spurring, most pronounced in the lateral compartment. SOFT TISSUES: The soft tissues are unremarkable. IMPRESSION: 1. No acute findings. 2. 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 Electronically signed by: Franky Crease MD 02/07/2024 01:18 AM EDT RP Workstation: [id number]    Recent Results (from the past 2160 hours)  CBC with Differential     Status: Abnormal   Collection Time: 02/07/24 12:57 AM  Result Value Ref Range   WBC 13.4 (H) 4.0 - 10.5 K/uL   RBC 3.77 (L) 3.87 - 5.11 MIL/uL   Hemoglobin 11.3 (L) 12.0 - 15.0 g/dL   HCT 62.8 63.9 - 53.9 %   MCV 98.4 80.0 - 100.0 fL   MCH 30.0 26.0 - 34.0 pg   MCHC 30.5 30.0 - 36.0 g/dL   RDW 86.7 88.4 - 84.4 %   Platelets 265 150 - 400 K/uL   nRBC 0.0 0.0 - 0.2 %   Neutrophils Relative % 78 %   Neutro Abs 10.3 (H) 1.7 - 7.7 K/uL   Lymphocytes Relative 14 %   Lymphs Abs 1.9 0.7 - 4.0 K/uL   Monocytes Relative 6 %   Monocytes Absolute 0.9 0.1 - 1.0 K/uL   Eosinophils Relative 1 %   Eosinophils Absolute 0.2 0.0 - 0.5 K/uL   Basophils Relative 0 %   Basophils Absolute 0.1 0.0 - 0.1 K/uL   Immature Granulocytes 1 %   Abs Immature Granulocytes 0.09 (H) 0.00 - 0.07 K/uL    Comment: Performed at [facility], [address], [place], [place] [address]  Basic metabolic panel     Status: Abnormal   Collection Time: 02/07/24 12:57 AM  Result Value Ref Range   Sodium 142  135 - 145 mmol/L   Potassium 3.9 3.5 - 5.1 mmol/L   Chloride 95 (L) 98 - 111 mmol/L   CO2 33 (H) 22 - 32 mmol/L   Glucose, Bld 144 (H) 70 - 99 mg/dL    Comment: Glucose reference range applies only to samples taken after fasting for at least 8 hours.   BUN 14 8 - 23 mg/dL   Creatinine, Ser 9.10 0.44 - 1.00 mg/dL   Calcium  8.0 (L) 8.9 - 10.3 mg/dL   GFR, Estimated >39 >39 mL/min  Comment: (NOTE) Calculated using the CKD-EPI Creatinine Equation (2021)    Anion gap 14 5 - 15    Comment: Performed at [facility], [address], [place], [place] [address]  B Nat Peptide     Status: None   Collection Time: 02/21/24  2:57 PM  Result Value Ref Range   B Natriuretic Peptide 24.4 0.0 - 100.0 pg/mL    Comment: Performed at [facility], [address] [place], [place] [address]  Basic Metabolic Panel (BMET)     Status: Abnormal   Collection Time: 02/21/24  2:57 PM  Result Value Ref Range   Sodium 138 135 - 145 mmol/L   Potassium 4.5 3.5 - 5.1 mmol/L   Chloride 91 (L) 98 - 111 mmol/L   CO2 36 (H) 22 - 32 mmol/L   Glucose, Bld 116 (H) 70 - 99 mg/dL    Comment: Glucose reference range applies only to samples taken after fasting for at least 8 hours.   BUN 16 8 - 23 mg/dL   Creatinine, Ser 9.06 0.44 - 1.00 mg/dL   Calcium  8.2 (L) 8.9 - 10.3 mg/dL   GFR, Estimated >39 >39 mL/min    Comment: (NOTE) Calculated using the CKD-EPI Creatinine Equation (2021)    Anion gap 11 5 - 15    Comment: Performed at [facility], [address], [place], [place] [address]  Glucose, capillary     Status: Abnormal   Collection Time: 03/03/24 10:11 AM  Result Value Ref Range   Glucose-Capillary 144 (H) 70 - 99 mg/dL    Comment: Glucose reference range applies only to samples taken after fasting for at least 8 hours.  POCT I-Stat EG7     Status: Abnormal   Collection Time: 03/03/24 11:56 AM  Result Value Ref Range   pH, Ven 7.399 7.25 - 7.43   pCO2, Ven 76.7 (HH) 44 - 60 mmHg   pO2,  Ven 34 32 - 45 mmHg   Bicarbonate 47.4 (H) 20.0 - 28.0 mmol/L   TCO2 50 (H) 22 - 32 mmol/L   O2 Saturation 60 %   Acid-Base Excess 19.0 (H) 0.0 - 2.0 mmol/L   Sodium 140 135 - 145 mmol/L   Potassium 3.6 3.5 - 5.1 mmol/L   Calcium , Ion 1.10 (L) 1.15 - 1.40 mmol/L   HCT 38.0 36.0 - 46.0 %   Hemoglobin 12.9 12.0 - 15.0 g/dL   Sample type VENOUS    Comment NOTIFIED PHYSICIAN   POCT I-Stat EG7     Status: Abnormal   Collection Time: 03/03/24 11:56 AM  Result Value Ref Range   pH, Ven 7.399 7.25 - 7.43   pCO2, Ven 74.9 (HH) 44 - 60 mmHg   pO2, Ven 33 32 - 45 mmHg   Bicarbonate 46.3 (H) 20.0 - 28.0 mmol/L   TCO2 49 (H) 22 - 32 mmol/L   O2 Saturation 60 %   Acid-Base Excess 18.0 (H) 0.0 - 2.0 mmol/L   Sodium 141 135 - 145 mmol/L   Potassium 3.6 3.5 - 5.1 mmol/L   Calcium , Ion 1.08 (L) 1.15 - 1.40 mmol/L   HCT 38.0 36.0 - 46.0 %   Hemoglobin 12.9 12.0 - 15.0 g/dL   Sample type VENOUS    Comment NOTIFIED PHYSICIAN   Resp panel by RT-PCR (RSV, Flu A&B, Covid) Anterior Nasal Swab     Status: None   Collection Time: 03/12/24 10:51 PM   Specimen: Anterior Nasal Swab  Result  Value Ref Range   SARS Coronavirus 2 by RT PCR NEGATIVE NEGATIVE   Influenza A by PCR NEGATIVE NEGATIVE   Influenza B by PCR NEGATIVE NEGATIVE    Comment: (NOTE) The Xpert Xpress SARS-CoV-2/FLU/RSV plus assay is intended as an aid in the diagnosis of influenza from Nasopharyngeal swab specimens and should not be used as a sole basis for treatment. Nasal washings and aspirates are unacceptable for Xpert Xpress SARS-CoV-2/FLU/RSV testing.  Fact Sheet for Patients: bloggercourse.com  Fact Sheet for Healthcare Providers: seriousbroker.it  This test is not yet approved or cleared by the [place]  FDA and has been authorized for detection and/or diagnosis of SARS-CoV-2 by FDA under an Emergency Use Authorization (EUA). This EUA will remain in effect (meaning this  test can be used) for the duration of the COVID-19 declaration under Section 564(b)(1) of the Act, 21 U.S.C. section 360bbb-3(b)(1), unless the authorization is terminated or revoked.     Resp Syncytial Virus by PCR NEGATIVE NEGATIVE    Comment: (NOTE) Fact Sheet for Patients: bloggercourse.com  Fact Sheet for Healthcare Providers: seriousbroker.it  This test is not yet approved or cleared by the [place]  FDA and has been authorized for detection and/or diagnosis of SARS-CoV-2 by FDA under an Emergency Use Authorization (EUA). This EUA will remain in effect (meaning this test can be used) for the duration of the COVID-19 declaration under Section 564(b)(1) of the Act, 21 U.S.C. section 360bbb-3(b)(1), unless the authorization is terminated or revoked.  Performed at [facility], [address], [place], [place] [address]   Basic metabolic panel     Status: Abnormal   Collection Time: 03/12/24 11:11 PM  Result Value Ref Range   Sodium 139 135 - 145 mmol/L   Potassium 4.2 3.5 - 5.1 mmol/L   Chloride 95 (L) 98 - 111 mmol/L   CO2 39 (H) 22 - 32 mmol/L   Glucose, Bld 143 (H) 70 - 99 mg/dL    Comment: Glucose reference range applies only to samples taken after fasting for at least 8 hours.   BUN 35 (H) 8 - 23 mg/dL   Creatinine, Ser 8.74 (H) 0.44 - 1.00 mg/dL   Calcium  8.8 (L) 8.9 - 10.3 mg/dL   GFR, Estimated 48 (L) >60 mL/min    Comment: (NOTE) Calculated using the CKD-EPI Creatinine Equation (2021)    Anion gap 5 5 - 15    Comment: Performed at [facility], [address] [place], [place] [address]  CBC with Differential     Status: Abnormal   Collection Time: 03/12/24 11:11 PM  Result Value Ref Range   WBC 13.8 (H) 4.0 - 10.5 K/uL   RBC 3.81 (L) 3.87 - 5.11 MIL/uL   Hemoglobin 11.3 (L) 12.0 - 15.0 g/dL   HCT 62.3 63.9 - 53.9 %   MCV 98.7 80.0 - 100.0 fL   MCH 29.7 26.0 - 34.0 pg   MCHC 30.1 30.0 -  36.0 g/dL   RDW 86.4 88.4 - 84.4 %   Platelets 293 150 - 400 K/uL   nRBC 0.0 0.0 - 0.2 %   Neutrophils Relative % 74 %   Neutro Abs 10.2 (H) 1.7 - 7.7 K/uL   Lymphocytes Relative 17 %   Lymphs Abs 2.3 0.7 - 4.0 K/uL   Monocytes Relative 7 %   Monocytes Absolute 1.0 0.1 - 1.0 K/uL   Eosinophils Relative 1 %   Eosinophils Absolute 0.2 0.0 - 0.5 K/uL   Basophils Relative  0 %   Basophils Absolute 0.1 0.0 - 0.1 K/uL   Immature Granulocytes 1 %   Abs Immature Granulocytes 0.08 (H) 0.00 - 0.07 K/uL    Comment: Performed at [facility], [address], [place], [place] [address]  Brain natriuretic peptide     Status: None   Collection Time: 03/12/24 11:11 PM  Result Value Ref Range   B Natriuretic Peptide 12.1 0.0 - 100.0 pg/mL    Comment: Performed at [facility], [address] [place], [place] [address]  Troponin I (High Sensitivity)     Status: None   Collection Time: 03/12/24 11:11 PM  Result Value Ref Range   Troponin I (High Sensitivity) 6 <18 ng/L    Comment: (NOTE) Elevated high sensitivity troponin I (hsTnI) values and significant  changes across serial measurements may suggest ACS but many other  chronic and acute conditions are known to elevate hsTnI results.  Refer to the Links section for chest pain algorithms and additional  guidance. Performed at [facility], [address], [place], [place] [address]   Basic Metabolic Panel (BMET)     Status: Abnormal   Collection Time: 03/19/24  1:18 PM  Result Value Ref Range   Sodium 136 135 - 145 mmol/L   Potassium 3.7 3.5 - 5.1 mmol/L   Chloride 83 (L) 98 - 111 mmol/L   CO2 43 (H) 22 - 32 mmol/L   Glucose, Bld 185 (H) 70 - 99 mg/dL    Comment: Glucose reference range applies only to samples taken after fasting for at least 8 hours.   BUN 35 (H) 8 - 23 mg/dL   Creatinine, Ser 8.63 (H) 0.44 - 1.00 mg/dL   Calcium  8.9 8.9 - 10.3 mg/dL   GFR, Estimated 44 (L) >60 mL/min    Comment: (NOTE) Calculated  using the CKD-EPI Creatinine Equation (2021)    Anion gap 10 5 - 15    Comment: Performed at [facility], [address], [place], [place] [address]  B Nat Peptide     Status: None   Collection Time: 03/19/24  1:18 PM  Result Value Ref Range   B Natriuretic Peptide 8.7 0.0 - 100.0 pg/mL    Comment: Performed at [facility], [address] [place], [place] [address]  Strep pneumoniae urinary antigen     Status: None   Collection Time: 03/23/24  1:00 AM  Result Value Ref Range   Strep Pneumo Urinary Antigen NEGATIVE NEGATIVE    Comment:        Infection due to S. pneumoniae cannot be absolutely ruled out since the antigen present may be below the detection limit of the test. Performed at [facility], [address], [place], [place] [address]   Comprehensive metabolic panel     Status: Abnormal   Collection Time: 03/23/24  1:44 PM  Result Value Ref Range   Sodium 135 135 - 145 mmol/L   Potassium 3.6 3.5 - 5.1 mmol/L   Chloride 81 (L) 98 - 111 mmol/L   CO2 41 (H) 22 - 32 mmol/L   Glucose, Bld 175 (H) 70 - 99 mg/dL    Comment: Glucose reference range applies only to samples taken after fasting for at least 8 hours.   BUN 36 (H) 8 - 23 mg/dL   Creatinine, Ser 8.69 (H) 0.44 - 1.00 mg/dL   Calcium  8.4 (L) 8.9 - 10.3 mg/dL   Total Protein 7.4  6.5 - 8.1 g/dL   Albumin  4.1 3.5 - 5.0 g/dL   AST 33 15 - 41 U/L   ALT 22 0 - 44 U/L   Alkaline Phosphatase 50 38 - 126 U/L   Total Bilirubin 0.6 0.0 - 1.2 mg/dL   GFR, Estimated 46 (L) >60 mL/min    Comment: (NOTE) Calculated using the CKD-EPI Creatinine Equation (2021)    Anion gap 13 5 - 15    Comment: Performed at [facility], [address] [place], [place] [address]  Troponin I (High Sensitivity)     Status: None   Collection Time: 03/23/24  1:44 PM  Result Value Ref Range   Troponin I (High Sensitivity) 11 <18 ng/L    Comment: (NOTE) Elevated high sensitivity troponin I (hsTnI) values and significant   changes across serial measurements may suggest ACS but many other  chronic and acute conditions are known to elevate hsTnI results.  Refer to the Links section for chest pain algorithms and additional  guidance. Performed at [facility], [address] [name], [place] [address]   Brain natriuretic peptide     Status: None   Collection Time: 03/23/24  1:44 PM  Result Value Ref Range   B Natriuretic Peptide 6.9 0.0 - 100.0 pg/mL    Comment: Performed at [facility], [address] [place], [place] [address]  Urinalysis, Routine w reflex microscopic -Urine, Clean Catch     Status: None   Collection Time: 03/23/24  1:44 PM  Result Value Ref Range   Color, Urine YELLOW YELLOW   APPearance CLEAR CLEAR   Specific Gravity, Urine 1.017 1.005 - 1.030   pH 5.0 5.0 - 8.0   Glucose, UA NEGATIVE NEGATIVE mg/dL   Hgb urine dipstick NEGATIVE NEGATIVE   Bilirubin Urine NEGATIVE NEGATIVE   Ketones, ur NEGATIVE NEGATIVE mg/dL   Protein, ur NEGATIVE NEGATIVE mg/dL   Nitrite NEGATIVE NEGATIVE   Leukocytes,Ua NEGATIVE NEGATIVE    Comment: Performed at [facility], [address], [place], [place] [address]  Resp panel by RT-PCR (RSV, Flu A&B, Covid) Anterior Nasal Swab     Status: None   Collection Time: 03/23/24  1:44 PM   Specimen: Anterior Nasal Swab  Result Value Ref Range   SARS Coronavirus 2 by RT PCR NEGATIVE NEGATIVE   Influenza A by PCR NEGATIVE NEGATIVE   Influenza B by PCR NEGATIVE NEGATIVE    Comment: (NOTE) The Xpert Xpress SARS-CoV-2/FLU/RSV plus assay is intended as an aid in the diagnosis of influenza from Nasopharyngeal swab specimens and should not be used as a sole basis for treatment. Nasal washings and aspirates are unacceptable for Xpert Xpress SARS-CoV-2/FLU/RSV testing.  Fact Sheet for Patients: bloggercourse.com  Fact Sheet for Healthcare Providers: seriousbroker.it  This test is not yet  approved or cleared by the [place]  FDA and has been authorized for detection and/or diagnosis of SARS-CoV-2 by FDA under an Emergency Use Authorization (EUA). This EUA will remain in effect (meaning this test can be used) for the duration of the COVID-19 declaration under Section 564(b)(1) of the Act, 21 U.S.C. section 360bbb-3(b)(1), unless the authorization is terminated or revoked.     Resp Syncytial Virus by PCR NEGATIVE NEGATIVE    Comment: (NOTE) Fact Sheet for Patients: bloggercourse.com  Fact Sheet for Healthcare Providers: seriousbroker.it  This test is not yet approved or cleared by the [place]  FDA and has been authorized for detection and/or diagnosis of SARS-CoV-2  by FDA under an Emergency Use Authorization (EUA). This EUA will remain in effect (meaning this test can be used) for the duration of the COVID-19 declaration under Section 564(b)(1) of the Act, 21 U.S.C. section 360bbb-3(b)(1), unless the authorization is terminated or revoked.  Performed at [facility], [address] [place], [place] [address]   CBC with Differential/Platelet     Status: Abnormal   Collection Time: 03/23/24  3:22 PM  Result Value Ref Range   WBC 15.1 (H) 4.0 - 10.5 K/uL   RBC 4.00 3.87 - 5.11 MIL/uL   Hemoglobin 12.2 12.0 - 15.0 g/dL   HCT 60.5 63.9 - 53.9 %   MCV 98.5 80.0 - 100.0 fL   MCH 30.5 26.0 - 34.0 pg   MCHC 31.0 30.0 - 36.0 g/dL   RDW 86.6 88.4 - 84.4 %   Platelets 300 150 - 400 K/uL   nRBC 0.0 0.0 - 0.2 %   Neutrophils Relative % 76 %   Neutro Abs 11.5 (H) 1.7 - 7.7 K/uL   Lymphocytes Relative 13 %   Lymphs Abs 2.0 0.7 - 4.0 K/uL   Monocytes Relative 9 %   Monocytes Absolute 1.3 (H) 0.1 - 1.0 K/uL   Eosinophils Relative 1 %   Eosinophils Absolute 0.2 0.0 - 0.5 K/uL   Basophils Relative 0 %   Basophils Absolute 0.1 0.0 - 0.1 K/uL   Immature Granulocytes 1 %   Abs Immature Granulocytes 0.09 (H)  0.00 - 0.07 K/uL    Comment: Performed at [facility], [address] [place], [place] [address]  I-stat chem 8, ED     Status: Abnormal   Collection Time: 03/23/24  3:28 PM  Result Value Ref Range   Sodium 137 135 - 145 mmol/L   Potassium 3.4 (L) 3.5 - 5.1 mmol/L   Chloride 81 (L) 98 - 111 mmol/L   BUN 42 (H) 8 - 23 mg/dL   Creatinine, Ser 8.59 (H) 0.44 - 1.00 mg/dL   Glucose, Bld 826 (H) 70 - 99 mg/dL    Comment: Glucose reference range applies only to samples taken after fasting for at least 8 hours.   Calcium , Ion 1.01 (L) 1.15 - 1.40 mmol/L   TCO2 45 (H) 22 - 32 mmol/L   Hemoglobin 15.0 12.0 - 15.0 g/dL   HCT 55.9 63.9 - 53.9 %  Troponin I (High Sensitivity)     Status: None   Collection Time: 03/23/24  3:44 PM  Result Value Ref Range   Troponin I (High Sensitivity) 7 <18 ng/L    Comment: (NOTE) Elevated high sensitivity troponin I (hsTnI) values and significant  changes across serial measurements may suggest ACS but many other  chronic and acute conditions are known to elevate hsTnI results.  Refer to the Links section for chest pain algorithms and additional  guidance. Performed at [facility], [address], [place], [place] [address]   Respiratory (~20 pathogens) panel by PCR     Status: None   Collection Time: 03/24/24 12:06 AM   Specimen: Respiratory  Result Value Ref Range   Adenovirus NOT DETECTED NOT DETECTED   Coronavirus 229E NOT DETECTED NOT DETECTED    Comment: (NOTE) The Coronavirus on the Respiratory Panel, DOES NOT test for the novel  Coronavirus (2019 nCoV)    Coronavirus HKU1 NOT DETECTED NOT DETECTED   Coronavirus NL63 NOT DETECTED NOT DETECTED   Coronavirus OC43 NOT DETECTED NOT DETECTED   Metapneumovirus NOT  DETECTED NOT DETECTED   Rhinovirus / Enterovirus NOT DETECTED NOT DETECTED   Influenza A NOT DETECTED NOT DETECTED   Influenza B NOT DETECTED NOT DETECTED   Parainfluenza Virus 1 NOT DETECTED NOT DETECTED   Parainfluenza  Virus 2 NOT DETECTED NOT DETECTED   Parainfluenza Virus 3 NOT DETECTED NOT DETECTED   Parainfluenza Virus 4 NOT DETECTED NOT DETECTED   Respiratory Syncytial Virus NOT DETECTED NOT DETECTED   Bordetella pertussis NOT DETECTED NOT DETECTED   Bordetella Parapertussis NOT DETECTED NOT DETECTED   Chlamydophila pneumoniae NOT DETECTED NOT DETECTED   Mycoplasma pneumoniae NOT DETECTED NOT DETECTED    Comment: Performed at [facility], [address] [place], [place] [address]  Legionella Pneumophila Serogp 1 Ur Ag     Status: None   Collection Time: 03/24/24 12:41 AM  Result Value Ref Range   L. pneumophila Serogp 1 Ur Ag Negative Negative    Comment: (NOTE) Presumptive negative for L. pneumophila serogroup 1 antigen in urine, suggesting no recent or current infection. Legionnaires' disease cannot be ruled out since other serogroups and species may also cause disease. Performed At: [facility] [address] [place], [place] [id number] Jennette Shorter MD Ey:[phone]    Source of Sample URINE, RANDOM     Comment: Performed at [facility], [address] [place], [place] [address]  MRSA Next Gen by PCR, Nasal     Status: None   Collection Time: 03/24/24  1:13 AM   Specimen: Nasal Swab  Result Value Ref Range   MRSA by PCR Next Gen NOT DETECTED NOT DETECTED    Comment: (NOTE) The GeneXpert MRSA Assay (FDA approved for NASAL specimens only), is one component of a comprehensive MRSA colonization surveillance program. It is not intended to diagnose MRSA infection nor to guide or monitor treatment for MRSA infections. Test performance is not FDA approved in patients less than 61 years old. Performed at [facility], [address], [place], [place] [address]   Basic metabolic panel     Status: Abnormal   Collection Time: 03/24/24  6:02 AM  Result Value Ref Range   Sodium 139 135 - 145 mmol/L   Potassium 3.5 3.5 - 5.1 mmol/L   Chloride 86 (L) 98 - 111  mmol/L   CO2 43 (H) 22 - 32 mmol/L   Glucose, Bld 129 (H) 70 - 99 mg/dL    Comment: Glucose reference range applies only to samples taken after fasting for at least 8 hours.   BUN 34 (H) 8 - 23 mg/dL   Creatinine, Ser 8.77 (H) 0.44 - 1.00 mg/dL   Calcium  8.4 (L) 8.9 - 10.3 mg/dL   GFR, Estimated 50 (L) >60 mL/min    Comment: (NOTE) Calculated using the CKD-EPI Creatinine Equation (2021)    Anion gap 10 5 - 15    Comment: Performed at [facility], [address], [place], [place] [address]  CBC     Status: Abnormal   Collection Time: 03/24/24  6:02 AM  Result Value Ref Range   WBC 11.9 (H) 4.0 - 10.5 K/uL   RBC 3.98 3.87 - 5.11 MIL/uL   Hemoglobin 12.1 12.0 - 15.0 g/dL   HCT 59.8 63.9 - 53.9 %   MCV 100.8 (H) 80.0 - 100.0 fL   MCH 30.4 26.0 - 34.0 pg   MCHC 30.2 30.0 - 36.0 g/dL   RDW 86.3 88.4 - 84.4 %   Platelets 266 150 -  400 K/uL   nRBC 0.0 0.0 - 0.2 %    Comment: Performed at [facility], [address], [place], [place] [address]  Procalcitonin     Status: None   Collection Time: 03/24/24  6:02 AM  Result Value Ref Range   Procalcitonin <0.10 ng/mL    Comment:        Interpretation: PCT (Procalcitonin) <= 0.5 ng/mL: Systemic infection (sepsis) is not likely. Local bacterial infection is possible. (NOTE)       Sepsis PCT Algorithm           Lower Respiratory Tract                                      Infection PCT Algorithm    ----------------------------     ----------------------------         PCT < 0.25 ng/mL                PCT < 0.10 ng/mL          Strongly encourage             Strongly discourage   discontinuation of antibiotics    initiation of antibiotics    ----------------------------     -----------------------------       PCT 0.25 - 0.50 ng/mL            PCT 0.10 - 0.25 ng/mL               OR       >80% decrease in PCT            Discourage initiation of                                            antibiotics      Encourage  discontinuation           of antibiotics    ----------------------------     -----------------------------         PCT >= 0.50 ng/mL              PCT 0.26 - 0.50 ng/mL               AND        <80% decrease in PCT             Encourage initiation of                                             antibiotics       Encourage continuation           of antibiotics    ----------------------------     -----------------------------        PCT >= 0.50 ng/mL                  PCT > 0.50 ng/mL               AND         increase in PCT                  Strongly encourage  initiation of antibiotics    Strongly encourage escalation           of antibiotics                                     -----------------------------                                           PCT <= 0.25 ng/mL                                                 OR                                        > 80% decrease in PCT                                      Discontinue / Do not initiate                                             antibiotics  Performed at [facility], [address] [place], [place] [address]   Magnesium      Status: None   Collection Time: 03/24/24  6:02 AM  Result Value Ref Range   Magnesium  1.8 1.7 - 2.4 mg/dL    Comment: Performed at [facility], [address] [place], [place] [address]  Glucose, capillary     Status: Abnormal   Collection Time: 03/25/24  7:52 AM  Result Value Ref Range   Glucose-Capillary 144 (H) 70 - 99 mg/dL    Comment: Glucose reference range applies only to samples taken after fasting for at least 8 hours.  Basic metabolic panel     Status: Abnormal   Collection Time: 03/25/24 11:05 AM  Result Value Ref Range   Sodium 139 135 - 145 mmol/L   Potassium 3.8 3.5 - 5.1 mmol/L   Chloride 90 (L) 98 - 111 mmol/L   CO2 39 (H) 22 - 32 mmol/L   Glucose, Bld 119 (H) 70 - 99 mg/dL    Comment: Glucose reference range applies only to samples  taken after fasting for at least 8 hours.   BUN 26 (H) 8 - 23 mg/dL   Creatinine, Ser 9.02 0.44 - 1.00 mg/dL   Calcium  9.9 8.9 - 10.3 mg/dL   GFR, Estimated >39 >39 mL/min    Comment: (NOTE) Calculated using the CKD-EPI Creatinine Equation (2021)    Anion gap 10 5 - 15    Comment: Performed at [facility], [address], [place], [place] [address]  CBC with Differential     Status: Abnormal   Collection Time: 03/31/24  8:42 PM  Result Value Ref Range   WBC 12.7 (H) 4.0 - 10.5 K/uL   RBC 3.95 3.87 - 5.11 MIL/uL   Hemoglobin 12.2 12.0 - 15.0 g/dL  HCT 40.7 36.0 - 46.0 %   MCV 103.0 (H) 80.0 - 100.0 fL   MCH 30.9 26.0 - 34.0 pg   MCHC 30.0 30.0 - 36.0 g/dL   RDW 86.7 88.4 - 84.4 %   Platelets 271 150 - 400 K/uL   nRBC 0.0 0.0 - 0.2 %   Neutrophils Relative % 81 %   Neutro Abs 10.3 (H) 1.7 - 7.7 K/uL   Lymphocytes Relative 11 %   Lymphs Abs 1.4 0.7 - 4.0 K/uL   Monocytes Relative 5 %   Monocytes Absolute 0.7 0.1 - 1.0 K/uL   Eosinophils Relative 2 %   Eosinophils Absolute 0.2 0.0 - 0.5 K/uL   Basophils Relative 0 %   Basophils Absolute 0.0 0.0 - 0.1 K/uL   Immature Granulocytes 1 %   Abs Immature Granulocytes 0.09 (H) 0.00 - 0.07 K/uL    Comment: Performed at [facility], [address], [place], [place] [address]  Troponin T, High Sensitivity     Status: None   Collection Time: 03/31/24  8:42 PM  Result Value Ref Range   Troponin T High Sensitivity <15 0 - 19 ng/L    Comment: (NOTE) Biotin concentrations > 1000 ng/mL falsely decrease TnT results.  Serial cardiac troponin measurements are suggested.  Refer to the Links section for chest pain algorithms and additional  guidance. Performed at [facility], [address] [place], [place] [address]   Pro Brain natriuretic peptide     Status: None   Collection Time: 03/31/24  8:42 PM  Result Value Ref Range   Pro Brain Natriuretic Peptide 183.0 <300.0 pg/mL    Comment: (NOTE) Age Group         Cut-Points    Interpretation  < 50 years     450 pg/mL       NT-proBNP > 450 pg/mL indicates                                ADHF is likely              50 to 75 years  900 pg/mL      NT-proBNP > 900 pg/mL indicates          ADHF is likely  > 75 years      1800 pg/mL     NT-proBNP > 1800 pg/mL indicates          ADHF is likely                           All ages    Results between       Indeterminate. Further clinical             300 and the cut-   information is needed to determine            point for age group   if ADHF is present.                                                             Elecsys proBNP II/ Elecsys proBNP II STAT           Cut-Point  Interpretation  300 pg/mL                    NT-proBNP <300pg/mL indicates                             ADHF is not likely  Performed at [facility], [address], [place], [place] [address]   Comprehensive metabolic panel     Status: Abnormal   Collection Time: 03/31/24  8:42 PM  Result Value Ref Range   Sodium 141 135 - 145 mmol/L   Potassium 4.3 3.5 - 5.1 mmol/L   Chloride 97 (L) 98 - 111 mmol/L   CO2 32 22 - 32 mmol/L   Glucose, Bld 199 (H) 70 - 99 mg/dL    Comment: Glucose reference range applies only to samples taken after fasting for at least 8 hours.   BUN 14 8 - 23 mg/dL   Creatinine, Ser 9.10 0.44 - 1.00 mg/dL   Calcium  8.9 8.9 - 10.3 mg/dL   Total Protein 7.3 6.5 - 8.1 g/dL   Albumin  4.3 3.5 - 5.0 g/dL   AST 40 15 - 41 U/L    Comment: HEMOLYSIS AT THIS LEVEL MAY AFFECT RESULT   ALT 27 0 - 44 U/L   Alkaline Phosphatase 64 38 - 126 U/L   Total Bilirubin 0.2 0.0 - 1.2 mg/dL   GFR, Estimated >39 >39 mL/min    Comment: (NOTE) Calculated using the CKD-EPI Creatinine Equation (2021)    Anion gap 12 5 - 15    Comment: Performed at [facility], [address], [place], [place] [address]  I-Stat venous blood gas, [facility] ED, [facility], [facility])     Status: Abnormal   Collection Time: 03/31/24   8:47 PM  Result Value Ref Range   pH, Ven 7.378 7.25 - 7.43   pCO2, Ven 59.9 44 - 60 mmHg   pO2, Ven 38 32 - 45 mmHg   Bicarbonate 35.3 (H) 20.0 - 28.0 mmol/L   TCO2 37 (H) 22 - 32 mmol/L   O2 Saturation 70 %   Acid-Base Excess 8.0 (H) 0.0 - 2.0 mmol/L   Sodium 140 135 - 145 mmol/L   Potassium 4.1 3.5 - 5.1 mmol/L   Calcium , Ion 1.07 (L) 1.15 - 1.40 mmol/L   HCT 40.0 36.0 - 46.0 %   Hemoglobin 13.6 12.0 - 15.0 g/dL   Sample type VENOUS    Comment NOTIFIED PHYSICIAN   Resp panel by RT-PCR (RSV, Flu A&B, Covid) Anterior Nasal Swab     Status: None   Collection Time: 04/20/24  1:20 AM   Specimen: Anterior Nasal Swab  Result Value Ref Range   SARS Coronavirus 2 by RT PCR NEGATIVE NEGATIVE   Influenza A by PCR NEGATIVE NEGATIVE   Influenza B by PCR NEGATIVE NEGATIVE    Comment: (NOTE) The Xpert Xpress SARS-CoV-2/FLU/RSV plus assay is intended as an aid in the diagnosis of influenza from Nasopharyngeal swab specimens and should not be used as a sole basis for treatment. Nasal washings and aspirates are unacceptable for Xpert Xpress SARS-CoV-2/FLU/RSV testing.  Fact Sheet for Patients: bloggercourse.com  Fact Sheet for Healthcare Providers: seriousbroker.it  This test is not yet approved or cleared by the [place]  FDA and has been authorized for detection and/or diagnosis of SARS-CoV-2 by FDA under an Emergency Use Authorization (EUA). This EUA will remain in effect (meaning this test can be used)  for the duration of the COVID-19 declaration under Section 564(b)(1) of the Act, 21 U.S.C. section 360bbb-3(b)(1), unless the authorization is terminated or revoked.     Resp Syncytial Virus by PCR NEGATIVE NEGATIVE    Comment: (NOTE) Fact Sheet for Patients: bloggercourse.com  Fact Sheet for Healthcare Providers: seriousbroker.it  This test is not yet approved or cleared by  the [place]  FDA and has been authorized for detection and/or diagnosis of SARS-CoV-2 by FDA under an Emergency Use Authorization (EUA). This EUA will remain in effect (meaning this test can be used) for the duration of the COVID-19 declaration under Section 564(b)(1) of the Act, 21 U.S.C. section 360bbb-3(b)(1), unless the authorization is terminated or revoked.  Performed at [facility], [address] [place], [place] [address]   Comprehensive metabolic panel     Status: Abnormal   Collection Time: 04/20/24  1:57 AM  Result Value Ref Range   Sodium 140 135 - 145 mmol/L   Potassium 4.9 3.5 - 5.1 mmol/L    Comment: HEMOLYSIS AT THIS LEVEL MAY AFFECT RESULT   Chloride 96 (L) 98 - 111 mmol/L   CO2 35 (H) 22 - 32 mmol/L   Glucose, Bld 153 (H) 70 - 99 mg/dL    Comment: Glucose reference range applies only to samples taken after fasting for at least 8 hours.   BUN 20 8 - 23 mg/dL   Creatinine, Ser 8.89 (H) 0.44 - 1.00 mg/dL   Calcium  7.9 (L) 8.9 - 10.3 mg/dL   Total Protein 7.2 6.5 - 8.1 g/dL   Albumin  4.2 3.5 - 5.0 g/dL   AST 29 15 - 41 U/L    Comment: HEMOLYSIS AT THIS LEVEL MAY AFFECT RESULT   ALT 24 0 - 44 U/L   Alkaline Phosphatase 56 38 - 126 U/L   Total Bilirubin 0.2 0.0 - 1.2 mg/dL   GFR, Estimated 56 (L) >60 mL/min    Comment: (NOTE) Calculated using the CKD-EPI Creatinine Equation (2021)    Anion gap 10 5 - 15    Comment: Performed at [facility], [address], [place], [place] [address]  CBC     Status: Abnormal   Collection Time: 04/20/24  1:57 AM  Result Value Ref Range   WBC 12.1 (H) 4.0 - 10.5 K/uL   RBC 3.62 (L) 3.87 - 5.11 MIL/uL   Hemoglobin 11.2 (L) 12.0 - 15.0 g/dL   HCT 62.5 63.9 - 53.9 %   MCV 103.3 (H) 80.0 - 100.0 fL   MCH 30.9 26.0 - 34.0 pg   MCHC 29.9 (L) 30.0 - 36.0 g/dL   RDW 86.6 88.4 - 84.4 %   Platelets 253 150 - 400 K/uL   nRBC 0.0 0.0 - 0.2 %    Comment: Performed at [facility], [address],  [place], [place] [address]  Pro Brain natriuretic peptide     Status: None   Collection Time: 04/20/24  1:57 AM  Result Value Ref Range   Pro Brain Natriuretic Peptide 257.0 <300.0 pg/mL    Comment: (NOTE) Age Group        Cut-Points    Interpretation  < 50 years     450 pg/mL       NT-proBNP > 450 pg/mL indicates                                ADHF is likely  50 to 75 years  900 pg/mL      NT-proBNP > 900 pg/mL indicates          ADHF is likely  > 75 years      1800 pg/mL     NT-proBNP > 1800 pg/mL indicates          ADHF is likely                           All ages    Results between       Indeterminate. Further clinical             300 and the cut-   information is needed to determine            point for age group   if ADHF is present.                                                             Elecsys proBNP II/ Elecsys proBNP II STAT           Cut-Point                       Interpretation  300 pg/mL                    NT-proBNP <300pg/mL indicates                             ADHF is not likely  Performed at [facility], [address], [place], [place] [address]   Magnesium      Status: None   Collection Time: 04/20/24  1:57 AM  Result Value Ref Range   Magnesium  1.9 1.7 - 2.4 mg/dL    Comment: Performed at [facility], [address] [place], [place] [address]  Phosphorus     Status: None   Collection Time: 04/20/24  1:57 AM  Result Value Ref Range   Phosphorus 4.2 2.5 - 4.6 mg/dL    Comment: Performed at [facility], [address] [place], [place] [address]  Procalcitonin     Status: None   Collection Time: 04/20/24  1:57 AM  Result Value Ref Range   Procalcitonin <0.10 ng/mL    Comment: (NOTE)   Sepsis PCT Algorithm          Lower Respiratory Tract Infection                                         PCT Algorithm -----------------------------------------------------------------  <0.5 ng/mL                    <0.10 ng/mL   Associated with low           Antibiotic therapy strongly   risk for progression          discouraged. Indicates absence   to severe sepsis              of bacteria infection  and/or septic shock             --------------------------------------------------------------  0.5-2.0 ng/mL                 0.10-0.25 ng/mL  Recommended to retest         Antibiotic therapy discouraged.  PCT within 6-24 hours         Bacterial infection unlikely  ------------------------------------------------------------  >2 ng/mL                      0.26-0.50 ng/mL  Associated with high risk     Antibiotic therapy encouraged.  for progression to severe     Bacterial infection possible  sepsis/and or septic shock    ------------------------------                                 >0.50 ng/mL                                Antibiotic therapy strongly                                 encouraged.                                Suggestive of presence of                                 bacterial infection.                                 -------------------------------------------------------------------  < or = 0.50 ng/mL OR          < or = 0.25 OR 80% decrease in PCT  80% decrease in PCT           Antibiotic therapy   Antibiotic therapy may        may be discontinued  be discontinued                                 Performed at [facility], [address] [place], [place] [address]   Strep pneumoniae urinary antigen     Status: None   Collection Time: 04/20/24  3:42 AM  Result Value Ref Range   Strep Pneumo Urinary Antigen NEGATIVE NEGATIVE    Comment:        Infection due to S. pneumoniae cannot be absolutely ruled out since the antigen present may be below the detection limit of the test. Performed at [facility], [address] [place], [place] [address]   Blood culture (routine x 2)     Status: None (Preliminary result)   Collection Time: 04/20/24  4:05 AM   Specimen: BLOOD  Result  Value Ref Range   Specimen Description BLOOD BLOOD LEFT ARM    Special Requests      BOTTLES DRAWN AEROBIC AND ANAEROBIC Blood Culture results may not be optimal due to an inadequate volume of blood received in culture bottles   Culture      NO GROWTH 3 DAYS Performed at [facility], [address], [place], [place] [address]  Report Status PENDING   Blood culture (routine x 2)     Status: None (Preliminary result)   Collection Time: 04/20/24  4:12 AM   Specimen: BLOOD LEFT ARM  Result Value Ref Range   Specimen Description BLOOD LEFT ARM    Special Requests      BOTTLES DRAWN AEROBIC AND ANAEROBIC Blood Culture results may not be optimal due to an inadequate volume of blood received in culture bottles   Culture      NO GROWTH 3 DAYS Performed at [facility], [address] [place], [place] [address]    Report Status PENDING   I-Stat CG4 Lactic Acid     Status: Abnormal   Collection Time: 04/20/24  4:17 AM  Result Value Ref Range   Lactic Acid, Venous 2.3 (HH) 0.5 - 1.9 mmol/L   Comment NOTIFIED PHYSICIAN   Legionella Pneumophila Serogp 1 Ur Ag     Status: None   Collection Time: 04/20/24  4:26 AM  Result Value Ref Range   L. pneumophila Serogp 1 Ur Ag Negative Negative    Comment: (NOTE) Presumptive negative for L. pneumophila serogroup 1 antigen in urine, suggesting no recent or current infection. Legionnaires' disease cannot be ruled out since other serogroups and species may also cause disease. Performed At: [facility] [address] [place], [place] [id number] Jennette Shorter MD Ey:[id number]    Source of Sample CCU     Comment: Performed at [facility], [address] [place], [place] [address]  Urinalysis, Complete w Microscopic -Urine, Clean Catch     Status: Abnormal   Collection Time: 04/20/24  4:26 AM  Result Value Ref Range   Color, Urine YELLOW YELLOW   APPearance CLOUDY (A) CLEAR   Specific Gravity, Urine 1.013 1.005 -  1.030   pH 5.0 5.0 - 8.0   Glucose, UA NEGATIVE NEGATIVE mg/dL   Hgb urine dipstick NEGATIVE NEGATIVE   Bilirubin Urine NEGATIVE NEGATIVE   Ketones, ur NEGATIVE NEGATIVE mg/dL   Protein, ur 30 (A) NEGATIVE mg/dL   Nitrite NEGATIVE NEGATIVE   Leukocytes,Ua NEGATIVE NEGATIVE   RBC / HPF 0-5 0 - 5 RBC/hpf   WBC, UA 0-5 0 - 5 WBC/hpf   Bacteria, UA RARE (A) NONE SEEN   Squamous Epithelial / HPF 0-5 0 - 5 /HPF   Hyaline Casts, UA PRESENT     Comment: Performed at [facility], [address], [place], [place] [address]  MRSA Next Gen by PCR, Nasal     Status: None   Collection Time: 04/20/24  4:45 AM   Specimen: Nasal Mucosa; Nasal Swab  Result Value Ref Range   MRSA by PCR Next Gen NOT DETECTED NOT DETECTED    Comment: (NOTE) The GeneXpert MRSA Assay (FDA approved for NASAL specimens only), is one component of a comprehensive MRSA colonization surveillance program. It is not intended to diagnose MRSA infection nor to guide or monitor treatment for MRSA infections. Test performance is not FDA approved in patients less than 62 years old. Performed at [facility], [address] [place], [place] [address]   I-Stat CG4 Lactic Acid     Status: None   Collection Time: 04/20/24  6:25 AM  Result Value Ref Range   Lactic Acid, Venous 1.4 0.5 - 1.9 mmol/L  CBG monitoring, ED     Status: Abnormal   Collection Time: 04/20/24  7:39 AM  Result Value Ref Range   Glucose-Capillary 209 (H)  70 - 99 mg/dL    Comment: Glucose reference range applies only to samples taken after fasting for at least 8 hours.  CBG monitoring, ED     Status: Abnormal   Collection Time: 04/20/24 11:54 AM  Result Value Ref Range   Glucose-Capillary 177 (H) 70 - 99 mg/dL    Comment: Glucose reference range applies only to samples taken after fasting for at least 8 hours.  Mycoplasma pneumoniae antibody, IgM     Status: None   Collection Time: 04/20/24  2:41 PM  Result Value Ref Range   Mycoplasma pneumo  IgM <770 0 - 769 U/mL    Comment: (NOTE)                             Negative            <770 Clinically significant amount of M. pneumoniae antibody not detected.                             Low Positive   770 - 950 M. pneumoniae specific IgM presumptively detected.  It is recommended that another sample be collected 1-2 weeks later to assure reactivity.                             Positive            >950 Highly significant amount of M. pneumoniae specific IgM antibody detected. Performed At: [facility] [address] [place], [place] [id number] Jennette Shorter MD Ey:[id number]   Glucose, capillary     Status: Abnormal   Collection Time: 04/20/24  5:43 PM  Result Value Ref Range   Glucose-Capillary 117 (H) 70 - 99 mg/dL    Comment: Glucose reference range applies only to samples taken after fasting for at least 8 hours.  Glucose, capillary     Status: Abnormal   Collection Time: 04/20/24  9:12 PM  Result Value Ref Range   Glucose-Capillary 168 (H) 70 - 99 mg/dL    Comment: Glucose reference range applies only to samples taken after fasting for at least 8 hours.  Glucose, capillary     Status: Abnormal   Collection Time: 04/21/24  7:58 AM  Result Value Ref Range   Glucose-Capillary 122 (H) 70 - 99 mg/dL    Comment: Glucose reference range applies only to samples taken after fasting for at least 8 hours.  CBC     Status: Abnormal   Collection Time: 04/21/24  9:12 AM  Result Value Ref Range   WBC 13.6 (H) 4.0 - 10.5 K/uL   RBC 3.42 (L) 3.87 - 5.11 MIL/uL   Hemoglobin 10.5 (L) 12.0 - 15.0 g/dL   HCT 64.6 (L) 63.9 - 53.9 %   MCV 103.2 (H) 80.0 - 100.0 fL   MCH 30.7 26.0 - 34.0 pg   MCHC 29.7 (L) 30.0 - 36.0 g/dL   RDW 86.7 88.4 - 84.4 %   Platelets 227 150 - 400 K/uL   nRBC 0.0 0.0 - 0.2 %    Comment: Performed at [facility] Lab, [address] [place], [place] [address]  Glucose, capillary     Status: Abnormal   Collection Time: 04/21/24 12:00 PM  Result  Value Ref Range   Glucose-Capillary 176 (H) 70 - 99 mg/dL    Comment: Glucose reference range applies only  to samples taken after fasting for at least 8 hours.  Glucose, capillary     Status: Abnormal   Collection Time: 04/21/24  4:32 PM  Result Value Ref Range   Glucose-Capillary 169 (H) 70 - 99 mg/dL    Comment: Glucose reference range applies only to samples taken after fasting for at least 8 hours.  Glucose, capillary     Status: Abnormal   Collection Time: 04/21/24  9:07 PM  Result Value Ref Range   Glucose-Capillary 167 (H) 70 - 99 mg/dL    Comment: Glucose reference range applies only to samples taken after fasting for at least 8 hours.  Basic metabolic panel with GFR     Status: Abnormal   Collection Time: 04/22/24  4:11 AM  Result Value Ref Range   Sodium 141 135 - 145 mmol/L   Potassium 3.9 3.5 - 5.1 mmol/L   Chloride 92 (L) 98 - 111 mmol/L   CO2 40 (H) 22 - 32 mmol/L   Glucose, Bld 132 (H) 70 - 99 mg/dL    Comment: Glucose reference range applies only to samples taken after fasting for at least 8 hours.   BUN 12 8 - 23 mg/dL   Creatinine, Ser 9.28 0.44 - 1.00 mg/dL   Calcium  8.6 (L) 8.9 - 10.3 mg/dL   GFR, Estimated >39 >39 mL/min    Comment: (NOTE) Calculated using the CKD-EPI Creatinine Equation (2021)    Anion gap 8 5 - 15    Comment: Performed at [facility], [address], [place], [place] [address]  Glucose, capillary     Status: Abnormal   Collection Time: 04/22/24  9:06 AM  Result Value Ref Range   Glucose-Capillary 188 (H) 70 - 99 mg/dL    Comment: Glucose reference range applies only to samples taken after fasting for at least 8 hours.  Glucose, capillary     Status: Abnormal   Collection Time: 04/22/24 12:56 PM  Result Value Ref Range   Glucose-Capillary 216 (H) 70 - 99 mg/dL    Comment: Glucose reference range applies only to samples taken after fasting for at least 8 hours.  Glucose, capillary     Status: Abnormal   Collection Time: 04/22/24   4:36 PM  Result Value Ref Range   Glucose-Capillary 146 (H) 70 - 99 mg/dL    Comment: Glucose reference range applies only to samples taken after fasting for at least 8 hours.  Glucose, capillary     Status: Abnormal   Collection Time: 04/22/24  8:55 PM  Result Value Ref Range   Glucose-Capillary 127 (H) 70 - 99 mg/dL    Comment: Glucose reference range applies only to samples taken after fasting for at least 8 hours.  CBC with Differential/Platelet     Status: Abnormal   Collection Time: 04/23/24  5:06 AM  Result Value Ref Range   WBC 11.6 (H) 4.0 - 10.5 K/uL   RBC 3.68 (L) 3.87 - 5.11 MIL/uL   Hemoglobin 11.1 (L) 12.0 - 15.0 g/dL   HCT 62.7 63.9 - 53.9 %   MCV 101.1 (H) 80.0 - 100.0 fL   MCH 30.2 26.0 - 34.0 pg   MCHC 29.8 (L) 30.0 - 36.0 g/dL   RDW 86.8 88.4 - 84.4 %   Platelets 253 150 - 400 K/uL   nRBC 0.0 0.0 - 0.2 %   Neutrophils Relative % 75 %   Neutro Abs 8.7 (H) 1.7 - 7.7 K/uL   Lymphocytes Relative 14 %  Lymphs Abs 1.6 0.7 - 4.0 K/uL   Monocytes Relative 7 %   Monocytes Absolute 0.8 0.1 - 1.0 K/uL   Eosinophils Relative 2 %   Eosinophils Absolute 0.3 0.0 - 0.5 K/uL   Basophils Relative 1 %   Basophils Absolute 0.1 0.0 - 0.1 K/uL   Immature Granulocytes 1 %   Abs Immature Granulocytes 0.13 (H) 0.00 - 0.07 K/uL    Comment: Performed at [facility], [address], [place], [place] [address]  Basic metabolic panel     Status: Abnormal   Collection Time: 04/23/24  5:06 AM  Result Value Ref Range   Sodium 143 135 - 145 mmol/L   Potassium 3.8 3.5 - 5.1 mmol/L   Chloride 93 (L) 98 - 111 mmol/L   CO2 42 (H) 22 - 32 mmol/L   Glucose, Bld 125 (H) 70 - 99 mg/dL    Comment: Glucose reference range applies only to samples taken after fasting for at least 8 hours.   BUN 17 8 - 23 mg/dL   Creatinine, Ser 9.28 0.44 - 1.00 mg/dL   Calcium  9.1 8.9 - 10.3 mg/dL   GFR, Estimated >39 >39 mL/min    Comment: (NOTE) Calculated using the CKD-EPI Creatinine Equation  (2021)    Anion gap 8 5 - 15    Comment: Performed at [facility], [address] [place], [place] [address]  Glucose, capillary     Status: Abnormal   Collection Time: 04/23/24  8:00 AM  Result Value Ref Range   Glucose-Capillary 173 (H) 70 - 99 mg/dL    Comment: Glucose reference range applies only to samples taken after fasting for at least 8 hours.        Beverley KATHEE Hummer, MD  I,Emily Lagle,acting as a scribe for Beverley KATHEE Hummer, MD.,have documented all relevant documentation on the behalf of Beverley KATHEE Hummer, MD.  LILLETTE Beverley KATHEE Hummer, MD, have reviewed all documentation for this visit. The documentation on 04/23/2024 for the exam, diagnosis, procedures, and orders are all accurate and complete.    [1]  Current Facility-Administered Medications on File Prior to Visit  Medication Dose Route Frequency Provider Last Rate Last Admin   acetaminophen  (TYLENOL ) tablet 650 mg  650 mg Oral Q6H PRN Howerter, Justin B, DO   650 mg at 04/21/24 1831   Or   acetaminophen  (TYLENOL ) suppository 650 mg  650 mg Rectal Q6H PRN Howerter, Justin B, DO       albuterol  (VENTOLIN  HFA) 108 (90 Base) MCG/ACT inhaler 1-2 puff  1-2 puff Inhalation Q4H PRN Regalado, Belkys A, MD       benzonatate  (TESSALON ) capsule 200 mg  200 mg Oral TID PRN Howerter, Justin B, DO   200 mg at 04/21/24 2204   budesonide  (PULMICORT ) nebulizer solution 0.25 mg  0.25 mg Nebulization BID Howerter, Justin B, DO   0.25 mg at 04/23/24 0750   busPIRone  (BUSPAR ) tablet 30 mg  30 mg Oral BID Regalado, Belkys A, MD   30 mg at 04/23/24 0834   ceFEPIme  (MAXIPIME ) 2 g in sodium chloride  0.9 % 100 mL IVPB  2 g Intravenous Q8H Clair Lynwood CROME, RPH 200 mL/hr at 04/23/24 0522 2 g at 04/23/24 0522   enoxaparin  (LOVENOX ) injection 70 mg  70 mg Subcutaneous Q24H Regalado, Belkys A, MD   70 mg at 04/23/24 9167   fenofibrate  tablet 54 mg  54 mg Oral Daily Howerter, Justin B, DO   54 mg at  04/23/24 0833   gabapentin  (NEURONTIN ) capsule 100  mg  100 mg Oral BID Regalado, Belkys A, MD   100 mg at 04/23/24 0834   guaiFENesin  (MUCINEX ) 12 hr tablet 1,200 mg  1,200 mg Oral BID Regalado, Belkys A, MD   1,200 mg at 04/23/24 0834   HYDROcodone -acetaminophen  (NORCO/VICODIN) 5-325 MG per tablet 1 tablet  1 tablet Oral Q8H PRN Regalado, Belkys A, MD   1 tablet at 04/23/24 0831   insulin  aspart (novoLOG ) injection 0-5 Units  0-5 Units Subcutaneous QHS Howerter, Justin B, DO       insulin  aspart (novoLOG ) injection 0-9 Units  0-9 Units Subcutaneous TID WC Howerter, Justin B, DO   2 Units at 04/23/24 9167   levothyroxine  (SYNTHROID ) tablet 125 mcg  125 mcg Oral QHS Howerter, Justin B, DO   125 mcg at 04/22/24 2103   loratadine  (CLARITIN ) tablet 10 mg  10 mg Oral QHS Howerter, Justin B, DO   10 mg at 04/22/24 2103   lurasidone  (LATUDA ) tablet 20 mg  20 mg Oral QHS Regalado, Belkys A, MD   20 mg at 04/22/24 2100   melatonin tablet 3 mg  3 mg Oral QHS PRN Howerter, Justin B, DO   3 mg at 04/22/24 2103   ondansetron  (ZOFRAN ) injection 4 mg  4 mg Intravenous Q6H PRN Howerter, Justin B, DO       rosuvastatin  (CRESTOR ) tablet 10 mg  10 mg Oral QPM Howerter, Justin B, DO   10 mg at 04/22/24 1722   torsemide  (DEMADEX ) tablet 40 mg  40 mg Oral BID Regalado, Belkys A, MD   40 mg at 04/23/24 0833   traZODone  (DESYREL ) tablet 50 mg  50 mg Oral QHS PRN Regalado, Belkys A, MD   50 mg at 04/22/24 2103   umeclidinium-vilanterol (ANORO ELLIPTA ) 62.5-25 MCG/ACT 1 puff  1 puff Inhalation Daily Howerter, Justin B, DO   1 puff at 04/20/24 0849   vancomycin  (VANCOREADY) IVPB 2000 mg/400 mL  2,000 mg Intravenous Q24H Bitonti, Michael T, RPH 200 mL/hr at 04/22/24 2201 2,000 mg at 04/22/24 2201   Vitamin D  (Ergocalciferol ) (DRISDOL ) 1.25 MG (50000 UNIT) capsule 50,000 Units  50,000 Units Oral Q7 days Regalado, Belkys A, MD   50,000 Units at 04/22/24 1722   Current Outpatient Medications on File Prior to Visit  Medication Sig Dispense Refill   ACCU-CHEK GUIDE TEST test  strip USE TO CHECK BLOOD SUGAR 3 TIMES DAILY 100 each 12   Accu-Chek Softclix Lancets lancets Use to check blood sugar 3 times daily 100 each 12   acetaminophen  (TYLENOL ) 650 MG CR tablet Take 1,950 mg by mouth every 8 (eight) hours as needed for pain.     albuterol  (PROVENTIL ) (2.5 MG/3ML) 0.083% nebulizer solution Take 3 mLs (2.5 mg total) by nebulization every 6 (six) hours as needed for wheezing or shortness of breath. (Patient taking differently: Take 2.5 mg by nebulization in the morning, at noon, and at bedtime.) 75 mL 12   albuterol  (VENTOLIN  HFA) 108 (90 Base) MCG/ACT inhaler Inhale 1-2 puffs into the lungs every 6 (six) hours as needed for wheezing or shortness of breath. 1 each 1   allopurinol  (ZYLOPRIM ) 100 MG tablet Take 1 tablet (100 mg total) by mouth daily. 90 tablet 3   aspirin  EC 81 MG tablet Take 1 tablet (81 mg total) by mouth daily. Swallow whole. 90 tablet 3   blood glucose meter kit and supplies KIT Dispense based on patient and insurance preference. Use up  to four times daily as directed. 1 each 0   Blood Glucose Monitoring Suppl DEVI 1 each by Does not apply route in the morning, at noon, and at bedtime. May substitute to any manufacturer covered by patient's insurance. 1 each 0   busPIRone  (BUSPAR ) 30 MG tablet Take 1 tablet (30 mg total) by mouth 2 (two) times daily. 180 tablet 3   butalbital -acetaminophen -caffeine  (FIORICET ) 50-325-40 MG tablet Take 1 tablet by mouth every 6 (six) hours as needed for headache. 14 tablet 0   carvedilol  (COREG ) 6.25 MG tablet Take 1 tablet (6.25 mg total) by mouth 2 (two) times daily. 60 tablet 11   clotrimazole  (LOTRIMIN ) 1 % cream Apply topically 2 (two) times daily. 30 g 0   diclofenac  Sodium (VOLTAREN ) 1 % GEL Apply 4 g topically 4 (four) times daily. 120 g 0   dicyclomine  (BENTYL ) 20 MG tablet Take 1 tablet (20 mg total) by mouth 2 (two) times daily. 20 tablet 0   doxepin  (SINEQUAN ) 10 MG capsule Take 10 mg by mouth at bedtime.      doxycycline  (MONODOX ) 100 MG capsule Take 100 mg by mouth 2 (two) times daily.     EPINEPHrine  0.3 mg/0.3 mL IJ SOAJ injection Inject 0.3 mg into the muscle once as needed for anaphylaxis.     fenofibrate  (TRICOR ) 145 MG tablet Take 1 tablet (145 mg total) by mouth daily. 30 tablet 11   FEROSUL 325 (65 Fe) MG tablet Take 1 tablet (325 mg total) by mouth daily. 30 tablet 11   gabapentin  (NEURONTIN ) 100 MG capsule Take 1 capsule (100 mg total) by mouth 2 (two) times daily. 60 capsule 0   HYDROcodone -acetaminophen  (NORCO/VICODIN) 5-325 MG tablet Take 1 tablet by mouth every 4 (four) hours as needed for severe pain (pain score 7-10). (Patient taking differently: Take 1 tablet by mouth every 8 (eight) hours as needed for severe pain (pain score 7-10).) 14 tablet 0   levocetirizine (XYZAL ) 5 MG tablet TAKE 1 TABLET (5 MG TOTAL) BY MOUTH AT BEDTIME. 30 tablet 0   levothyroxine  (SYNTHROID ) 125 MCG tablet TAKE 1 TABLET (125 MCG TOTAL) BY MOUTH AT BEDTIME. 90 tablet 0   lidocaine  (HM LIDOCAINE  PATCH) 4 % Place 1 patch onto the skin daily. 20 patch 0   [Paused] losartan  (COZAAR ) 25 MG tablet Take 1 tablet (25 mg total) by mouth daily. 30 tablet 11   lurasidone  (LATUDA ) 20 MG TABS tablet Take 1 tablet (20 mg total) by mouth at bedtime. Take with a snack. This is for mood and sleep. 90 tablet 3   [Paused] metolazone  (ZAROXOLYN ) 2.5 MG tablet Take 1 tablet (2.5 mg total) by mouth as directed. TAKE ONE DOSE OF METOLAZONE  2.5MG  TODAY, AND TOMORROW WITH (2) TABLETS OF POTASSIUM WITH THIS (Patient not taking: Reported on 04/20/2024) 2 tablet 0   nitroGLYCERIN  (NITROSTAT ) 0.4 MG SL tablet Place 1 tablet (0.4 mg total) under the tongue every 5 (five) minutes as needed for chest pain. 12 tablet 6   omeprazole  (PRILOSEC) 20 MG capsule TAKE 2 CAPSULES (40 MG TOTAL) BY MOUTH DAILY (2AM) 180 capsule 1   ondansetron  (ZOFRAN -ODT) 4 MG disintegrating tablet Take 1 tablet (4 mg total) by mouth every 8 (eight) hours as needed  for nausea or vomiting. 20 tablet 0   OXYGEN  Inhale 2 L into the lungs continuous.     potassium chloride  (KLOR-CON  M) 10 MEQ tablet Take 2 tablets (20 mEq total) by mouth daily. (Patient not taking: Reported on 04/20/2024)  60 tablet 3   potassium chloride  (KLOR-CON ) 10 MEQ tablet Take 10 mEq by mouth 2 (two) times daily.     rizatriptan  (MAXALT ) 10 MG tablet Take 1 tablet (10 mg total) by mouth as needed for migraine. May repeat in 2 hours if needed (Patient not taking: Reported on 04/20/2024) 10 tablet 0   rosuvastatin  (CRESTOR ) 10 MG tablet Take 1 tablet (10 mg total) by mouth every evening. 30 tablet 11   [Paused] torsemide  (DEMADEX ) 20 MG tablet Take 4 tablets (80 mg total) by mouth 2 (two) times daily. (Patient taking differently: Take 60 mg by mouth 2 (two) times daily.) 240 tablet 5   umeclidinium-vilanterol (ANORO ELLIPTA ) 62.5-25 MCG/ACT AEPB Inhale 1 puff into the lungs daily. 98 each 0   Vitamin D , Ergocalciferol , (DRISDOL ) 1.25 MG (50000 UNIT) CAPS capsule Take 1 capsule (50,000 Units total) by mouth every 7 (seven) days. 12 capsule 0   "

## 2024-04-23 NOTE — TOC Progression Note (Signed)
 Transition of Care [facility]) - Progression Note    Patient Details  Name: Michele Owens MRN: [id number] Date of Birth: 03/06/1961  Transition of Care [facility]) CM/SW Contact  [name], [name], RN Phone Number: 04/23/2024, 10:57 AM  Clinical Narrative: Physical Therapy recommendations are for Outpatient PT. Patient previously asked for OPPT to be arranged at the [address] location. An ambulatory referral was submitted and the office will call the patient once an appointment can be scheduled. Information is on the AVS. Staff RN is aware to Highlight the number on the AVS once discharge instructions have been printed. Patient states she will have transportation to appointments. No further disposition needs identified at this time.     Expected Discharge Plan: OP Rehab Barriers to Discharge: No Barriers Identified               Expected Discharge Plan and Services   Discharge Planning Services: CM Consult Post Acute Care Choice: NA                                         Social Drivers of Health (SDOH) Interventions SDOH Screenings   Food Insecurity: No Food Insecurity (04/20/2024)  Housing: High Risk (04/20/2024)  Transportation Needs: Unmet Transportation Needs (04/20/2024)  Utilities: Not At Risk (04/20/2024)  Alcohol Screen: Low Risk (10/24/2022)  Depression (PHQ2-9): Medium Risk (12/25/2023)  Financial Resource Strain: Low Risk (10/24/2022)  Social Connections: Moderately Isolated (04/20/2024)  Tobacco Use: Medium Risk (04/20/2024)    Readmission Risk Interventions    04/22/2024    3:19 PM 03/25/2024   12:35 PM 01/23/2024   12:36 PM  Readmission Risk Prevention Plan  Transportation Screening Complete Complete Complete  Medication Review (RN Care Manager) Referral to Pharmacy Referral to Pharmacy Complete  PCP or Specialist appointment within 3-5 days of discharge  Complete Complete  HRI or Home Care Consult Complete Complete Complete  SW Recovery  Care/Counseling Consult Complete Complete   Palliative Care Screening Not Applicable Not Applicable Not Applicable  Skilled Nursing Facility Not Applicable Not Applicable Not Applicable

## 2024-04-24 DIAGNOSIS — J189 Pneumonia, unspecified organism: Secondary | ICD-10-CM | POA: Diagnosis not present

## 2024-04-24 LAB — GLUCOSE, CAPILLARY
Glucose-Capillary: 216 mg/dL — ABNORMAL HIGH (ref 70–99)
Glucose-Capillary: 183 mg/dL — ABNORMAL HIGH (ref 70–99)
Glucose-Capillary: 137 mg/dL — ABNORMAL HIGH (ref 70–99)
Glucose-Capillary: 164 mg/dL — ABNORMAL HIGH (ref 70–99)

## 2024-04-24 MED ORDER — FUROSEMIDE 10 MG/ML IJ SOLN
60.0000 mg | Freq: Two times a day (BID) | INTRAMUSCULAR | Status: DC
  Administered 2024-04-24 – 2024-04-27 (×7): 60 mg via INTRAVENOUS
  Filled 2024-04-24 (×7): qty 6

## 2024-04-24 MED ORDER — POTASSIUM CHLORIDE CRYS ER 20 MEQ PO TBCR
40.0000 meq | EXTENDED_RELEASE_TABLET | Freq: Once | ORAL | Status: AC
  Administered 2024-04-24: 40 meq via ORAL
  Filled 2024-04-24: qty 2

## 2024-04-24 MED ORDER — LINEZOLID 600 MG PO TABS
600.0000 mg | ORAL_TABLET | Freq: Two times a day (BID) | ORAL | Status: AC
  Administered 2024-04-24 – 2024-04-26 (×6): 600 mg via ORAL
  Filled 2024-04-24 (×6): qty 1

## 2024-04-24 NOTE — Progress Notes (Signed)
 Occupational Therapy Treatment Patient Details Name: Michele Owens MRN: [id number] DOB: 24-Aug-1960 Today's Date: 04/24/2024   History of present illness 64 y.o. presents to [facility] 04/20/24 with worsening SOB/cough. Admitted with severe sepsis 2/2 PNA complicated with acute on chronic hypoxic respiratory failure. Prior admit 12/14-12/16 for PNA. PMHx: obesity, neuropathy, HTN, OSA, COPD, hypoxic RF on 2L [place], CHF, gout, bipolar, SVT, migraines GERD, hx of DVT, anxiety depression   HFpEF, asthma,tobacco use, HLD, pulmonary edema, thyroid  CA, anasarca   OT comments  This 64 yo female seen today to work on toilet transfers and to delve further into the possible need of benefiting from AE. Pt needed Min A to get to [facility] and min A to use AE for doffing and donning socks. She will continue to benefit from acute OT without need for follow up.      If plan is discharge home, recommend the following:  A lot of help with bathing/dressing/bathroom;Assistance with cooking/housework;Assist for transportation;Help with stairs or ramp for entrance   Equipment Recommendations  None recommended by OT       Precautions / Restrictions Precautions Precautions: Fall Recall of Precautions/Restrictions: Intact Restrictions Weight Bearing Restrictions Per Provider Order: No       Mobility Bed Mobility Overal bed mobility: Needs Assistance Bed Mobility: Supine to Sit     Supine to sit: Contact guard, HOB elevated          Transfers Overall transfer level: Needs assistance Equipment used: None Transfers: Sit to/from Stand Sit to Stand: Min assist     Step pivot transfers: Min assist           Balance Overall balance assessment: Needs assistance Sitting-balance support: No upper extremity supported, Feet supported Sitting balance-Leahy Scale: Good     Standing balance support: Single extremity supported Standing balance-Leahy Scale: Poor                             ADL  either performed or assessed with clinical judgement   ADL Overall ADL's : Needs assistance/impaired                       Lower Body Dressing Details (indicate cue type and reason): started education on AE for LBB/D Toilet Transfer: Minimal assistance                  Extremity/Trunk Assessment Upper Extremity Assessment Upper Extremity Assessment: Generalized weakness            Vision Patient Visual Report: No change from baseline               Cognition Arousal: Alert Behavior During Therapy: WFL for tasks assessed/performed Cognition: No apparent impairments                               Following commands: Intact                      Pertinent Vitals/ Pain       Pain Assessment Pain Assessment: Faces Faces Pain Scale: Hurts little more Pain Location: bil knees Pain Descriptors / Indicators: Aching, Sore Pain Intervention(s): Limited activity within patient's tolerance         Frequency  Min 2X/week        Progress Toward Goals  OT Goals(current goals can now be found in the care plan section)  Progress towards OT goals: Progressing toward goals  Acute Rehab OT Goals Patient Stated Goal: to go home and get therapy to help her balance and strength OT Goal Formulation: With patient Time For Goal Achievement: 05/05/24 Potential to Achieve Goals: Good         AM-PAC OT 6 Clicks Daily Activity     Outcome Measure   Help from another person eating meals?: None Help from another person taking care of personal grooming?: A Little Help from another person toileting, which includes using toliet, bedpan, or urinal?: A Little Help from another person bathing (including washing, rinsing, drying)?: A Little Help from another person to put on and taking off regular upper body clothing?: A Little Help from another person to put on and taking off regular lower body clothing?: A Little 6 Click Score: 19    End of  Session Equipment Utilized During Treatment:  (reacher and sock aid)  OT Visit Diagnosis: Unsteadiness on feet (R26.81)   Activity Tolerance Patient tolerated treatment well   Patient Left  (sitting on 3n1 at bedside--pt stated she would call for assist when she was finished)       Time: 8688-8667 OT Time Calculation (min): 21 min  Charges: OT General Charges $OT Visit: 1 Visit OT Treatments $Self Care/Home Management : 8-22 mins  Donny BECKER OT Acute Rehabilitation Services Office [phone]    [place] Dorothyann Distel 04/24/2024, 9:07 PM

## 2024-04-24 NOTE — Progress Notes (Signed)
 "  [facility] PROGRESS NOTE   Michele Owens FMW:[id number] DOB: [birth year] DOA: 04/20/2024  PCP: [name], MD  Brief History: 64 year old with past medical history significant for chronic diastolic heart failure, COPD, chronic hypoxic respiratory failure on 2 L of oxygen , diabetes type 2 who presents with worsening shortness of breath found to have severe sepsis due to pneumonia complicated with acute on chronic hypoxic respiratory failure.   Of note patient had a recent hospitalization from 03/23/2024 to 03/25/2024 for pneumonia treated with antibiotics.  Presented again 12/26: Complaining of worsening shortness of breath at that time discharged on doxycycline  as an outpatient.   She presented with worsening shortness of breath over the last week associated with increased cough. Evaluation in the ED consistent with COVID RSV PCR negative, chest x-ray showed interval worsening right middle lobe infiltrate concerning for worsening pneumonia.  Consultants: Pulmonology  Procedures: None  Subjective/Interval History: Patient mentioned that she is feeling slightly better today.  Less short of breath.  Cough is improving.  Denies any chest pain.  No nausea vomiting.  Does complain of swelling in her legs.  Assessment/Plan:  Severe sepsis due to healthcare associated pneumonia: Patient with worsening cough, shortness of breath.  Chest x-ray showed worsening right middle lobe infiltrate.  RSV influenza and COVID-19 PCR negative.  Mycoplasma IgM was negative.  Strep pneumo urinary antigen and Legionella antigens were negative. Patient's WBC has improved.  Patient remains on vancomycin  and cefepime . Pulmonology was consulted. CT chest was done which revealed patchy area of air spaces density in the right upper lobe as well as faint diffuse ground glass densities in the left lung.  Right middle lobe nodule was also noted. Continue antibiotics.  Can change vancomycin  to  linezolid .  Continue cefepime .  Follow-up on sputum cultures.   Acute on chronic hypoxic respiratory failure - On chronic 2 L of oxygen  at home, currently requiring 3 L -Continue Anoro Ellipta , albuterol  as needed -Continue Pulmicort    Chronic diastolic heart failure - Diuretics were held during last hospitalization due to AKI.  This was supposed to be resumed by PCP on follow-up. - Resumed  torsemide  on 1/12 : 40 mg BID.  Still with significant lower extremity edema.  Will change torsemide  to IV furosemide  for a day or 2.   History of COPD - Continue Anoro Ellipta , albuterol , Pulmicort    Diabetes type 2: - Continue sliding scale insulin .  CBGs fairly stable.  Occasional high readings noted.   Hyperlipidemia: - Continue with fenofibrate  and Crestor    Acquired hypothyroidism - Continue Synthroid    Allergic rhinitis - Continue Claritin    Chronic pain: Resume gabapentin  Anxiety: Continue  BuSpar , latuda .    Class III obesity Estimated body mass index is 56.39 kg/m as calculated from the following:   Height as of this encounter: 5' 5 (1.651 m).   Weight as of this encounter: 153.7 kg.  DVT Prophylaxis: Lovenox  Code Status: Full code Family Communication: Discussed with patient Disposition Plan: Home when improved.  PT OT eval.     Medications: Scheduled:  budesonide   0.25 mg Nebulization BID   busPIRone   30 mg Oral BID   enoxaparin  (LOVENOX ) injection  70 mg Subcutaneous Q24H   fenofibrate   54 mg Oral Daily   gabapentin   100 mg Oral BID   guaiFENesin   1,200 mg Oral BID   insulin  aspart  0-5 Units Subcutaneous QHS   insulin  aspart  0-9 Units Subcutaneous TID WC   levothyroxine   125 mcg Oral QHS  linezolid   600 mg Oral Q12H   loratadine   10 mg Oral QHS   lurasidone   20 mg Oral QHS   rosuvastatin   10 mg Oral QPM   torsemide   40 mg Oral BID   umeclidinium-vilanterol  1 puff Inhalation Daily   Vitamin D  (Ergocalciferol )  50,000 Units Oral Q7 days   Continuous:   ceFEPime  (MAXIPIME ) IV 2 g (04/24/24 0540)   PRN:acetaminophen  **OR** acetaminophen , albuterol , benzonatate , HYDROcodone -acetaminophen , melatonin, ondansetron  (ZOFRAN ) IV, traZODone   Antibiotics: Anti-infectives (From admission, onward)    Start     Dose/Rate Route Frequency Ordered Stop   04/24/24 1030  linezolid  (ZYVOX ) tablet 600 mg        600 mg Oral Every 12 hours 04/24/24 0936 04/27/24 0959   04/22/24 2200  vancomycin  (VANCOREADY) IVPB 2000 mg/400 mL  Status:  Discontinued        2,000 mg 200 mL/hr over 120 Minutes Intravenous Every 24 hours 04/22/24 0712 04/24/24 0936   04/20/24 2200  vancomycin  (VANCOREADY) IVPB 1750 mg/350 mL  Status:  Discontinued        1,750 mg 175 mL/hr over 120 Minutes Intravenous Every 24 hours 04/20/24 0530 04/22/24 0712   04/20/24 1000  ceFEPIme  (MAXIPIME ) 2 g in sodium chloride  0.9 % 100 mL IVPB        2 g 200 mL/hr over 30 Minutes Intravenous Every 8 hours 04/20/24 0530 04/26/24 2359   04/20/24 0345  vancomycin  (VANCOREADY) IVPB 2000 mg/400 mL        2,000 mg 200 mL/hr over 120 Minutes Intravenous  Once 04/20/24 0330 04/20/24 0628   04/20/24 0330  vancomycin  (VANCOCIN ) IVPB 1000 mg/200 mL premix  Status:  Discontinued        1,000 mg 200 mL/hr over 60 Minutes Intravenous  Once 04/20/24 0328 04/20/24 0330   04/20/24 0330  ceFEPIme  (MAXIPIME ) 2 g in sodium chloride  0.9 % 100 mL IVPB        2 g 200 mL/hr over 30 Minutes Intravenous  Once 04/20/24 0328 04/20/24 0428       Objective:  Vital Signs  Vitals:   04/23/24 0802 04/23/24 1155 04/23/24 2130 04/24/24 0818  BP: 130/61 (!) 141/76 137/76 129/69  Pulse:  88 91 87  Resp: 18 17 20 20   Temp: 98.6 F (37 C) 98.2 F (36.8 C) 98 F (36.7 C) 98.4 F (36.9 C)  TempSrc: Oral Oral Oral Oral  SpO2:  90%  90%  Weight:      Height:        Intake/Output Summary (Last 24 hours) at 04/24/2024 1101 Last data filed at 04/24/2024 0948 Gross per 24 hour  Intake --  Output 1400 ml  Net -1400 ml    Filed Weights   04/21/24 0342 04/22/24 0500 04/23/24 0600  Weight: (!) 155.7 kg (!) 154.2 kg (!) 153.7 kg    General appearance: Awake alert.  In no distress Resp: Improved effort.  Still has crackles bilateral bases.  No wheezing or rhonchi. Cardio: S1-S2 is normal regular.  No S3-S4.  No rubs murmurs or bruit GI: Abdomen is soft.  Nontender nondistended.  Bowel sounds are present normal.  No masses organomegaly Extremities: 1+ edema bilateral lower extremities Neurologic: Alert and oriented x3.  No focal neurological deficits.    Lab Results:  Data Reviewed: I have personally reviewed following labs and reports of the imaging studies  CBC: Recent Labs  Lab 04/20/24 0157 04/21/24 0912 04/23/24 0506  WBC 12.1* 13.6* 11.6*  NEUTROABS  --   --  8.7*  HGB 11.2* 10.5* 11.1*  HCT 37.4 35.3* 37.2  MCV 103.3* 103.2* 101.1*  PLT 253 227 253    Basic Metabolic Panel: Recent Labs  Lab 04/20/24 0157 04/22/24 0411 04/23/24 0506  NA 140 141 143  K 4.9 3.9 3.8  CL 96* 92* 93*  CO2 35* 40* 42*  GLUCOSE 153* 132* 125*  BUN 20 12 17   CREATININE 1.10* 0.71 0.71  CALCIUM  7.9* 8.6* 9.1  MG 1.9  --   --   PHOS 4.2  --   --     GFR: Estimated Creatinine Clearance: 107.3 mL/min (by C-G formula based on SCr of 0.71 mg/dL).  Liver Function Tests: Recent Labs  Lab 04/20/24 0157  AST 29  ALT 24  ALKPHOS 56  BILITOT 0.2  PROT 7.2  ALBUMIN  4.2    BNP (last 3 results) Recent Labs    01/16/24 2043 03/31/24 2042 04/20/24 0157  PROBNP 183.0 183.0 257.0   CBG: Recent Labs  Lab 04/23/24 0800 04/23/24 1153 04/23/24 1741 04/23/24 2133 04/24/24 0817  GLUCAP 173* 144* 126* 199* 216*    Recent Results (from the past 240 hours)  Resp panel by RT-PCR (RSV, Flu A&B, Covid) Anterior Nasal Swab     Status: None   Collection Time: 04/20/24  1:20 AM   Specimen: Anterior Nasal Swab  Result Value Ref Range Status   SARS Coronavirus 2 by RT PCR NEGATIVE NEGATIVE Final    Influenza A by PCR NEGATIVE NEGATIVE Final   Influenza B by PCR NEGATIVE NEGATIVE Final    Comment: (NOTE) The Xpert Xpress SARS-CoV-2/FLU/RSV plus assay is intended as an aid in the diagnosis of influenza from Nasopharyngeal swab specimens and should not be used as a sole basis for treatment. Nasal washings and aspirates are unacceptable for Xpert Xpress SARS-CoV-2/FLU/RSV testing.  Fact Sheet for Patients: bloggercourse.com  Fact Sheet for Healthcare Providers: seriousbroker.it  This test is not yet approved or cleared by the [place]  FDA and has been authorized for detection and/or diagnosis of SARS-CoV-2 by FDA under an Emergency Use Authorization (EUA). This EUA will remain in effect (meaning this test can be used) for the duration of the COVID-19 declaration under Section 564(b)(1) of the Act, 21 U.S.C. section 360bbb-3(b)(1), unless the authorization is terminated or revoked.     Resp Syncytial Virus by PCR NEGATIVE NEGATIVE Final    Comment: (NOTE) Fact Sheet for Patients: bloggercourse.com  Fact Sheet for Healthcare Providers: seriousbroker.it  This test is not yet approved or cleared by the [place]  FDA and has been authorized for detection and/or diagnosis of SARS-CoV-2 by FDA under an Emergency Use Authorization (EUA). This EUA will remain in effect (meaning this test can be used) for the duration of the COVID-19 declaration under Section 564(b)(1) of the Act, 21 U.S.C. section 360bbb-3(b)(1), unless the authorization is terminated or revoked.  Performed at [facility], [address], [place], [place] [address]   Expectorated Sputum Assessment w Gram Stain, Rflx to Resp Cult     Status: None   Collection Time: 04/20/24  3:40 AM   Specimen: Sputum  Result Value Ref Range Status   Specimen Description SPUTUM  Final   Special Requests Normal   Final   Sputum evaluation   Final    Sputum specimen not acceptable for testing.  Please recollect.   Gram Stain Report Called to,Read Back By and Verified With: RN K. PUDD [id number] @ 2026 FH Performed at [facility],  [address] [place], [place] [address]    Report Status 04/23/2024 FINAL  Final  Blood culture (routine x 2)     Status: None (Preliminary result)   Collection Time: 04/20/24  4:05 AM   Specimen: BLOOD LEFT ARM  Result Value Ref Range Status   Specimen Description BLOOD LEFT ARM  Final   Special Requests   Final    BOTTLES DRAWN AEROBIC AND ANAEROBIC Blood Culture results may not be optimal due to an inadequate volume of blood received in culture bottles   Culture   Final    NO GROWTH 4 DAYS Performed at [facility], [address], [place], [place] [address]    Report Status PENDING  Incomplete  Blood culture (routine x 2)     Status: None (Preliminary result)   Collection Time: 04/20/24  4:12 AM   Specimen: BLOOD LEFT ARM  Result Value Ref Range Status   Specimen Description BLOOD LEFT ARM  Final   Special Requests   Final    BOTTLES DRAWN AEROBIC AND ANAEROBIC Blood Culture results may not be optimal due to an inadequate volume of blood received in culture bottles   Culture   Final    NO GROWTH 4 DAYS Performed at [facility], [address] [place], [place] [address]    Report Status PENDING  Incomplete  MRSA Next Gen by PCR, Nasal     Status: None   Collection Time: 04/20/24  4:45 AM   Specimen: Nasal Mucosa; Nasal Swab  Result Value Ref Range Status   MRSA by PCR Next Gen NOT DETECTED NOT DETECTED Final    Comment: (NOTE) The GeneXpert MRSA Assay (FDA approved for NASAL specimens only), is one component of a comprehensive MRSA colonization surveillance program. It is not intended to diagnose MRSA infection nor to guide or monitor treatment for MRSA infections. Test performance is not FDA approved in patients less than 21  years old. Performed at [facility], [address] [place], [place] [address]       Radiology Studies: CT CHEST WO CONTRAST Result Date: 04/22/2024 CLINICAL DATA:  Recent pneumonia. EXAM: CT CHEST WITHOUT CONTRAST TECHNIQUE: Multidetector CT imaging of the chest was performed following the standard protocol without IV contrast. RADIATION DOSE REDUCTION: This exam was performed according to the departmental dose-optimization program which includes automated exposure control, adjustment of the mA and/or kV according to patient size and/or use of iterative reconstruction technique. COMPARISON:  Chest CT dated 03/23/2024. FINDINGS: Evaluation of this exam is limited in the absence of intravenous contrast. Cardiovascular: There is no cardiomegaly or pericardial effusion. Mild atherosclerotic calcification of the thoracic aorta. No aneurysmal dilatation. The central pulmonary arteries are grossly unremarkable. Mediastinum/Nodes: No hilar or mediastinal adenopathy. The esophagus is grossly unremarkable. No mediastinal fluid collection. Lungs/Pleura: There is a 12 mm right middle lobe nodule, present on the prior CT. Follow-up as per recommendation of prior CT. Bilateral streaky atelectasis. Patchy area of airspace density in the right upper lobe as well as faint diffuse ground-glass density in the left lung concerning for pneumonia. No pleural effusion or pneumothorax. The central airways are patent. Upper Abdomen: No acute abnormality. Musculoskeletal: Osteopenia with degenerative changes of the spine. No acute osseous pathology. IMPRESSION: 1. Patchy area of airspace density in the right upper lobe as well as faint diffuse ground-glass density in the left lung concerning for pneumonia. 2. A 12 mm right middle lobe nodule. Follow-up as per recommendation of  prior CT. 3.  Aortic Atherosclerosis (ICD10-I70.0). Electronically Signed   By: Vanetta Chou M.D.   On: 04/22/2024 15:19       LOS: 4 days    Brinleigh Tew  [facility] Pager on [email or web address]  04/24/2024, 11:01 AM   "

## 2024-04-24 NOTE — Progress Notes (Signed)
 "  NAME:  Michele Owens, MRN:  [id number], DOB:  January 11, 1961, LOS: 4 ADMISSION DATE:  04/20/2024, CONSULTATION DATE:  04/24/24 REFERRING MD:  TRH, CHIEF COMPLAINT:  cough   History of Present Illness:  64 y.o. woman with chronic hypoxemia presumed OSA/OHS here for evaluation of cough and ongoing infiltrate on chest imaging.  Patient mid to the hospital 12/25 here for pneumonia.  Posterior right upper lobe infiltrate but noted. As well as ~1 cm nodule. Treated with omnicef  after 2 days of IV abx. She got better. Cough worsened over last few days. Some mild worsening DOE. CT chest this admission similar with worsened RUL posterior segment infiltrate. She feels improved with the last 1-2 days in IV abx, cefepime  and vanc.   Pertinent  Medical History  Gout, HTN, neuropathy, OSA/OHS, PHTN WHO grp 2  Significant Hospital Events: Including procedures, antibiotic start and stop dates in addition to other pertinent events     Interim History / Subjective:  No respiratory culture and retroviral panel not resulted yet  Objective    Blood pressure (!) 145/85, pulse 83, temperature 98.7 F (37.1 C), temperature source Oral, resp. rate 20, height 5' 5 (1.651 m), weight (!) 153.7 kg, SpO2 92%.        Intake/Output Summary (Last 24 hours) at 04/24/2024 1757 Last data filed at 04/24/2024 1621 Gross per 24 hour  Intake --  Output 2600 ml  Net -2600 ml   Filed Weights   04/21/24 0342 04/22/24 0500 04/23/24 0600  Weight: (!) 155.7 kg (!) 154.2 kg (!) 153.7 kg    Examination: General: In bed speaking in full sentences rapidly on the phone with no conversational dyspnea   Resolved problem list   Assessment and Plan   Recurrent infiltrate on chest x-ray/CT scan: With slight worsening on my review and interpretation of posterior right upper lobe ground glass opacity from 03/29/2025 to 04/29/2024, this admission.  Considerations include slow to resolve bacterial pneumonia, nonbacterial  inflammatory pneumonia (org pan, eos pna), pulmonary edema.  With asymmetric nature makes pulm edema less likely in my mind. --obtain LRCx, RVP ordered, tailor antibiotics or treatments to result --Agree with broad spectrum abx, plan 7 days since failed prior conservative PO therapy - He is high risk for bronchoscopy, will consider in the future if infiltrate does not resolve on repeat imaging in several weeks or she worsens -- Absolute eosinophil count 300, not particular elevated but eosinophil pneumonia, chronic, remains on the differential  Lung Nodule: --Will need f/u in nodule clinic  PCCM is available as needed and plan to recheck on patient 1/19.  If clinical status changes or new results become available that require interpretation please contact us  sooner.  Labs   CBC: Recent Labs  Lab 04/20/24 0157 04/21/24 0912 04/23/24 0506  WBC 12.1* 13.6* 11.6*  NEUTROABS  --   --  8.7*  HGB 11.2* 10.5* 11.1*  HCT 37.4 35.3* 37.2  MCV 103.3* 103.2* 101.1*  PLT 253 227 253    Basic Metabolic Panel: Recent Labs  Lab 04/20/24 0157 04/22/24 0411 04/23/24 0506  NA 140 141 143  K 4.9 3.9 3.8  CL 96* 92* 93*  CO2 35* 40* 42*  GLUCOSE 153* 132* 125*  BUN 20 12 17   CREATININE 1.10* 0.71 0.71  CALCIUM  7.9* 8.6* 9.1  MG 1.9  --   --   PHOS 4.2  --   --    GFR: Estimated Creatinine Clearance: 107.3 mL/min (by C-G formula based on  SCr of 0.71 mg/dL). Recent Labs  Lab 04/20/24 0157 04/20/24 0417 04/20/24 0625 04/21/24 0912 04/23/24 0506  PROCALCITON <0.10  --   --   --   --   WBC 12.1*  --   --  13.6* 11.6*  LATICACIDVEN  --  2.3* 1.4  --   --     Liver Function Tests: Recent Labs  Lab 04/20/24 0157  AST 29  ALT 24  ALKPHOS 56  BILITOT 0.2  PROT 7.2  ALBUMIN  4.2   No results for input(s): LIPASE, AMYLASE in the last 168 hours. No results for input(s): AMMONIA  in the last 168 hours.  ABG    Component Value Date/Time   PHART 7.376 10/23/2022 1711    PCO2ART 59.7 (H) 10/23/2022 1711   PO2ART 65 (L) 10/23/2022 1711   HCO3 35.3 (H) 03/31/2024 2047   TCO2 37 (H) 03/31/2024 2047   O2SAT 70 03/31/2024 2047     Coagulation Profile: No results for input(s): INR, PROTIME in the last 168 hours.  Cardiac Enzymes: No results for input(s): CKTOTAL, CKMB, CKMBINDEX, TROPONINI in the last 168 hours.  HbA1C: Hgb A1c MFr Bld  Date/Time Value Ref Range Status  01/22/2024 01:40 AM 6.9 (H) 4.8 - 5.6 % Final    Comment:    (NOTE) Diagnosis of Diabetes The following HbA1c ranges recommended by the American Diabetes Association (ADA) may be used as an aid in the diagnosis of diabetes mellitus.  Hemoglobin             Suggested A1C NGSP%              Diagnosis  <5.7                   Non Diabetic  5.7-6.4                Pre-Diabetic  >6.4                   Diabetic  <7.0                   Glycemic control for                       adults with diabetes.    09/09/2023 02:38 PM 6.8 (H) 4.8 - 5.6 % Final    Comment:    (NOTE) Diagnosis of Diabetes The following HbA1c ranges recommended by the American Diabetes Association (ADA) may be used as an aid in the diagnosis of diabetes mellitus.  Hemoglobin             Suggested A1C NGSP%              Diagnosis  <5.7                   Non Diabetic  5.7-6.4                Pre-Diabetic  >6.4                   Diabetic  <7.0                   Glycemic control for                       adults with diabetes.      CBG: Recent Labs  Lab 04/23/24 1741 04/23/24 2133 04/24/24 0817 04/24/24 1216 04/24/24 1618  GLUCAP 126* 199*  216* 183* 137*    Review of Systems:   Comprehensive review of systems is negative unless mentioned in HPI above  Past Medical History:  She,  has a past medical history of Anxiety, Arthritis, Asthma, Chronic diastolic CHF (congestive heart failure) (HCC), COPD (chronic obstructive pulmonary disease) (HCC), Depression, Diabetes mellitus without  complication (HCC), GERD (gastroesophageal reflux disease), Gout, Headache, History of DVT of lower extremity, Hypertension, Morbid obesity with BMI of 45.0-49.9, adult (HCC), and Thyroid  cancer (HCC) (09/23/2016).   Surgical History:   Past Surgical History:  Procedure Laterality Date   BUNIONECTOMY Bilateral    CARDIAC CATHETERIZATION N/A 06/23/2015   Procedure: Right/Left Heart Cath and Coronary Angiography;  Surgeon: Ezra GORMAN Shuck, MD;  Location: [facility] INVASIVE CV LAB;  Service: Cardiovascular;  Laterality: N/A;   COLONOSCOPY WITH PROPOFOL  N/A 12/15/2015   Procedure: COLONOSCOPY WITH PROPOFOL ;  Surgeon: Norleen Hint, MD;  Location: [facility] ENDOSCOPY;  Service: Endoscopy;  Laterality: N/A;   RIGHT HEART CATH N/A 10/01/2019   Procedure: RIGHT HEART CATH;  Surgeon: Shuck Ezra GORMAN, MD;  Location: [facility] INVASIVE CV LAB;  Service: Cardiovascular;  Laterality: N/A;   RIGHT HEART CATH N/A 03/03/2024   Procedure: RIGHT HEART CATH;  Surgeon: Shuck Ezra GORMAN, MD;  Location: [facility] INVASIVE CV LAB;  Service: Cardiovascular;  Laterality: N/A;   THYROIDECTOMY  09/19/2016   THYROIDECTOMY N/A 09/19/2016   Procedure: TOTAL THYROIDECTOMY;  Surgeon: Eletha Boas, MD;  Location: [facility] OR;  Service: General;  Laterality: N/A;   TONSILLECTOMY     TOTAL ABDOMINAL HYSTERECTOMY  07/14/10     Social History:   reports that she quit smoking about 7 years ago. Her smoking use included cigarettes. She started smoking about 48 years ago. She has a 20.5 pack-year smoking history. She has never used smokeless tobacco. She reports that she does not currently use alcohol. She reports that she does not currently use drugs after having used the following drugs: Marijuana and Cocaine.   Family History:  Her family history includes Brain cancer in her paternal aunt; Cancer in her cousin, cousin, father, and maternal uncle; Diabetes in her mother; Thyroid  cancer in her mother.   Allergies Allergies[1]   Home Medications  Prior to Admission  medications  Medication Sig Start Date End Date Taking? Authorizing Provider  acetaminophen  (TYLENOL ) 650 MG CR tablet Take 1,950 mg by mouth every 8 (eight) hours as needed for pain.   Yes [provider]  albuterol  (PROVENTIL ) (2.5 MG/3ML) 0.083% nebulizer solution Take 3 mLs (2.5 mg total) by nebulization every 6 (six) hours as needed for wheezing or shortness of breath. Patient taking differently: Take 2.5 mg by nebulization in the morning, at noon, and at bedtime. 03/13/24  Yes Donnajean Lynwood DEL, PA-C  albuterol  (VENTOLIN  HFA) 108 (90 Base) MCG/ACT inhaler Inhale 1-2 puffs into the lungs every 6 (six) hours as needed for wheezing or shortness of breath. 12/26/23  Yes [name], MD  allopurinol  (ZYLOPRIM ) 100 MG tablet Take 1 tablet (100 mg total) by mouth daily. 11/13/23 11/07/24 Yes [name], MD  aspirin  EC 81 MG tablet Take 1 tablet (81 mg total) by mouth daily. Swallow whole. 11/13/23 11/07/24 Yes [name], MD  busPIRone  (BUSPAR ) 30 MG tablet Take 1 tablet (30 mg total) by mouth 2 (two) times daily. 12/05/23 11/29/24 Yes [name], MD  butalbital -acetaminophen -caffeine  (FIORICET ) 50-325-40 MG tablet Take 1 tablet by mouth every 6 (six) hours as needed for headache. 04/15/24  Yes Thedora Garnette HERO,  MD  carvedilol  (COREG ) 6.25 MG tablet Take 1 tablet (6.25 mg total) by mouth 2 (two) times daily. 12/26/23  Yes [name], MD  clotrimazole  (LOTRIMIN ) 1 % cream Apply topically 2 (two) times daily. 12/13/23  Yes [name], MD  diclofenac  Sodium (VOLTAREN ) 1 % GEL Apply 4 g topically 4 (four) times daily. 12/15/23  Yes Freddi Hamilton, MD  dicyclomine  (BENTYL ) 20 MG tablet Take 1 tablet (20 mg total) by mouth 2 (two) times daily. 04/15/24  Yes Thedora Garnette HERO, MD  doxepin  (SINEQUAN ) 10 MG capsule Take 10 mg by mouth at bedtime.   Yes [provider]  doxycycline  (MONODOX ) 100 MG capsule Take 100 mg by mouth 2 (two) times daily. 04/18/24  Yes [provider]  EPINEPHrine  0.3 mg/0.3 mL IJ SOAJ injection Inject 0.3 mg into the muscle once as needed for anaphylaxis.   Yes [provider]  fenofibrate  (TRICOR ) 145 MG tablet Take 1 tablet (145 mg total) by mouth daily. 12/26/23  Yes [name], MD  FEROSUL 325 (65 Fe) MG tablet Take 1 tablet (325 mg total) by mouth daily. 12/26/23  Yes [name], MD  gabapentin  (NEURONTIN ) 100 MG capsule Take 1 capsule (100 mg total) by mouth 2 (two) times daily. 04/15/24  Yes Thedora Garnette HERO, MD  HYDROcodone -acetaminophen  (NORCO/VICODIN) 5-325 MG tablet Take 1 tablet by mouth every 4 (four) hours as needed for severe pain (pain score 7-10). Patient taking differently: Take 1 tablet by mouth every 8 (eight) hours as needed for severe pain (pain score 7-10). 03/25/24  Yes Sigdel, Santosh, MD  levocetirizine (XYZAL ) 5 MG tablet TAKE 1 TABLET (5 MG TOTAL) BY MOUTH AT BEDTIME. 12/17/23  Yes [name], MD  levothyroxine  (SYNTHROID ) 125 MCG tablet TAKE 1 TABLET (125 MCG TOTAL) BY MOUTH AT BEDTIME. 03/20/24  Yes [name], MD  lidocaine  (HM LIDOCAINE  PATCH) 4 % Place 1 patch onto the skin daily. 03/13/24  Yes Donnajean Lynwood DEL, PA-C  [Paused] losartan  (COZAAR ) 25 MG tablet Take 1 tablet (25 mg total) by mouth daily. Wait to take this until your doctor or other care provider tells you to start again. 11/13/23  Yes [name], MD  lurasidone  (LATUDA ) 20 MG TABS tablet Take 1 tablet (20 mg total) by mouth at bedtime. Take with a snack. This is for mood and sleep. 12/25/23 12/19/24 Yes [name], MD  nitroGLYCERIN  (NITROSTAT ) 0.4 MG SL tablet Place 1 tablet (0.4 mg total) under the tongue every 5 (five) minutes as needed for chest pain. 04/15/24  Yes Thedora Garnette HERO, MD  omeprazole  (PRILOSEC) 20 MG capsule TAKE 2 CAPSULES (40 MG TOTAL) BY MOUTH DAILY (2AM) 03/20/24  Yes [name], MD  ondansetron  (ZOFRAN -ODT) 4 MG disintegrating tablet Take 1 tablet (4 mg total) by mouth  every 8 (eight) hours as needed for nausea or vomiting. 12/26/23  Yes [name], MD  potassium chloride  (KLOR-CON ) 10 MEQ tablet Take 10 mEq by mouth 2 (two) times daily. 04/17/24  Yes [provider]  rosuvastatin  (CRESTOR ) 10 MG tablet Take 1 tablet (10 mg total) by mouth every evening. 11/13/23  Yes [name], MD  [Paused] torsemide  (DEMADEX ) 20 MG tablet Take 4 tablets (80 mg total) by mouth 2 (two) times daily. Patient taking differently: Take 60 mg by mouth 2 (two) times daily. Wait to take this until your doctor or other care provider tells you to start again. 03/19/24  Yes Hayes,  Alma L, NP  umeclidinium-vilanterol (ANORO ELLIPTA ) 62.5-25 MCG/ACT AEPB Inhale 1 puff into the lungs daily. 01/02/24  Yes [name], MD  Vitamin D , Ergocalciferol , (DRISDOL ) 1.25 MG (50000 UNIT) CAPS capsule Take 1 capsule (50,000 Units total) by mouth every 7 (seven) days. 11/13/23  Yes [name], MD  ACCU-CHEK GUIDE TEST test strip USE TO CHECK BLOOD SUGAR 3 TIMES DAILY 03/04/24   Webb, Padonda B, FNP  Accu-Chek Softclix Lancets lancets Use to check blood sugar 3 times daily 12/26/23   [name], MD  blood glucose meter kit and supplies KIT Dispense based on patient and insurance preference. Use up to four times daily as directed. 12/25/20   Lue Elsie BROCKS, MD  Blood Glucose Monitoring Suppl DEVI 1 each by Does not apply route in the morning, at noon, and at bedtime. May substitute to any manufacturer covered by patient's insurance. 11/13/23   [name], MD  [Paused] metolazone  (ZAROXOLYN ) 2.5 MG tablet Take 1 tablet (2.5 mg total) by mouth as directed. TAKE ONE DOSE OF METOLAZONE  2.5MG  TODAY, AND TOMORROW WITH (2) TABLETS OF POTASSIUM WITH THIS Patient not taking: Reported on 04/20/2024 Wait to take this until your doctor or other care provider tells you to start again. 03/19/24   Hayes Beckey CROME, NP  OXYGEN  Inhale 2 L into the lungs continuous.    [provider]  potassium chloride  (KLOR-CON  M) 10 MEQ tablet Take 2 tablets (20 mEq total) by mouth daily. Patient not taking: Reported on 04/20/2024 03/03/24   Rolan Ezra RAMAN, MD  rizatriptan  (MAXALT ) 10 MG tablet Take 1 tablet (10 mg total) by mouth as needed for migraine. May repeat in 2 hours if needed Patient not taking: Reported on 04/20/2024 04/01/24   Billy Knee, FNP     Critical care time: n/a    Donnice JONELLE Beals, MD See TRACEY           [1]  Allergies Allergen Reactions   Bee Venom Anaphylaxis    All over my body (swelling)   Fioricet  [Butalbital -Apap-Caffeine ] Nausea And Vomiting and Rash   Ibuprofen  Rash and Other (See Comments)    Severe rash   Lamisil  [Terbinafine ] Rash and Other (See Comments)    Pt states this causes her to feel funny   Nsaids Other (See Comments)    Per MD's orders    "

## 2024-04-25 DIAGNOSIS — R600 Localized edema: Secondary | ICD-10-CM

## 2024-04-25 DIAGNOSIS — J189 Pneumonia, unspecified organism: Secondary | ICD-10-CM | POA: Diagnosis not present

## 2024-04-25 LAB — CBC
HCT: 36.8 % (ref 36.0–46.0)
Hemoglobin: 11.1 g/dL — ABNORMAL LOW (ref 12.0–15.0)
MCH: 30.7 pg (ref 26.0–34.0)
MCHC: 30.2 g/dL (ref 30.0–36.0)
MCV: 101.9 fL — ABNORMAL HIGH (ref 80.0–100.0)
Platelets: 257 K/uL (ref 150–400)
RBC: 3.61 MIL/uL — ABNORMAL LOW (ref 3.87–5.11)
RDW: 12.9 % (ref 11.5–15.5)
WBC: 12.2 K/uL — ABNORMAL HIGH (ref 4.0–10.5)
nRBC: 0 % (ref 0.0–0.2)

## 2024-04-25 LAB — GLUCOSE, CAPILLARY
Glucose-Capillary: 193 mg/dL — ABNORMAL HIGH (ref 70–99)
Glucose-Capillary: 149 mg/dL — ABNORMAL HIGH (ref 70–99)
Glucose-Capillary: 125 mg/dL — ABNORMAL HIGH (ref 70–99)
Glucose-Capillary: 206 mg/dL — ABNORMAL HIGH (ref 70–99)

## 2024-04-25 LAB — CULTURE, BLOOD (ROUTINE X 2)
Culture: NO GROWTH
Culture: NO GROWTH

## 2024-04-25 LAB — MAGNESIUM: Magnesium: 1.9 mg/dL (ref 1.7–2.4)

## 2024-04-25 LAB — BASIC METABOLIC PANEL WITH GFR
Anion gap: 10 (ref 5–15)
BUN: 19 mg/dL (ref 8–23)
CO2: 40 mmol/L — ABNORMAL HIGH (ref 22–32)
Calcium: 9.2 mg/dL (ref 8.9–10.3)
Chloride: 89 mmol/L — ABNORMAL LOW (ref 98–111)
Creatinine, Ser: 0.82 mg/dL (ref 0.44–1.00)
GFR, Estimated: >60 mL/min
Glucose, Bld: 122 mg/dL — ABNORMAL HIGH (ref 70–99)
Potassium: 4.3 mmol/L (ref 3.5–5.1)
Sodium: 139 mmol/L (ref 135–145)

## 2024-04-25 MED ORDER — LIDOCAINE 5 % EX PTCH
1.0000 | MEDICATED_PATCH | CUTANEOUS | Status: DC
  Administered 2024-04-25 – 2024-04-26 (×2): 1 via TRANSDERMAL
  Filled 2024-04-25 (×2): qty 1

## 2024-04-25 NOTE — Plan of Care (Signed)

## 2024-04-25 NOTE — Progress Notes (Signed)
 Occupational Therapy Treatment Patient Details Name: Michele Owens MRN: [id number] DOB: 07-13-1960 Today's Date: 04/25/2024   History of present illness 64 y.o. presents to [facility] 04/20/24 with worsening SOB/cough. Admitted with severe sepsis 2/2 PNA complicated with acute on chronic hypoxic respiratory failure. Prior admit 12/14-12/16 for PNA. PMHx: obesity, neuropathy, HTN, OSA, COPD, hypoxic RF on 2L [place], CHF, gout, bipolar, SVT, migraines GERD, hx of DVT, anxiety depression   HFpEF, asthma,tobacco use, HLD, pulmonary edema, thyroid  CA, anasarca   OT comments  This 64 yo female seen today to have her practice with AE for LBD and she returned demonstrated. She was issued a regular reacher, a regular sock aid, and a long handled sponge for use at home. She will continue to benefit from acute OT without need for followup.      If plan is discharge home, recommend the following:  A little help with walking and/or transfers;A little help with bathing/dressing/bathroom;Assistance with cooking/housework;Assist for transportation;Help with stairs or ramp for entrance   Equipment Recommendations  None recommended by OT       Precautions / Restrictions Precautions Precautions: Fall Recall of Precautions/Restrictions: Intact Restrictions Weight Bearing Restrictions Per Provider Order: No       Mobility Bed Mobility Overal bed mobility: Modified Independent Bed Mobility: Supine to Sit     Supine to sit: Modified independent (Device/Increase time), HOB elevated          Transfers Overall transfer level: Needs assistance Equipment used: Rolling walker (2 wheels) Transfers: Sit to/from Stand, Bed to chair/wheelchair/BSC Sit to Stand: Contact guard assist (increased time and momentum)     Step pivot transfers: Contact guard assist (wiht RW)           Balance Overall balance assessment: Needs assistance Sitting-balance support: No upper extremity supported, Feet  supported Sitting balance-Leahy Scale: Good     Standing balance support: Single extremity supported, During functional activity Standing balance-Leahy Scale: Poor                             ADL either performed or assessed with clinical judgement   ADL Overall ADL's : Needs assistance/impaired                     Lower Body Dressing: Set up;With adaptive equipment;Sit to/from stand Lower Body Dressing Details (indicate cue type and reason): Pt returned demo of doffing sock with reacher and donning sock with sock aid--issued pt reacher, sock aid, and long handled sponge Toilet Transfer: Contact guard assist;Rolling walker (2 wheels);BSC/3in1                  Extremity/Trunk Assessment Upper Extremity Assessment Upper Extremity Assessment: Overall WFL for tasks assessed            Vision Baseline Vision/History: 1 Wears glasses Ability to See in Adequate Light: 0 Adequate Patient Visual Report: No change from baseline           Communication Communication Communication: No apparent difficulties   Cognition Arousal: Alert Behavior During Therapy: WFL for tasks assessed/performed Cognition: No apparent impairments                               Following commands: Intact        Cueing   Cueing Techniques: Verbal cues             Pertinent Vitals/  Pain       Pain Assessment Pain Assessment: No/denies pain         Frequency  Min 2X/week        Progress Toward Goals  OT Goals(current goals can now be found in the care plan section)  Progress towards OT goals: Progressing toward goals  Acute Rehab OT Goals Patient Stated Goal: to go home and get follow up therapy for balance and strengthening OT Goal Formulation: With patient Time For Goal Achievement: 05/05/24 Potential to Achieve Goals: Good         AM-PAC OT 6 Clicks Daily Activity     Outcome Measure   Help from another person eating meals?:  None Help from another person taking care of personal grooming?: A Little Help from another person toileting, which includes using toliet, bedpan, or urinal?: A Little Help from another person bathing (including washing, rinsing, drying)?: A Little Help from another person to put on and taking off regular upper body clothing?: A Little Help from another person to put on and taking off regular lower body clothing?: A Little 6 Click Score: 19    End of Session Equipment Utilized During Treatment: Rolling walker (2 wheels);Oxygen  (2 liters, sock aid , reacher)  OT Visit Diagnosis: Unsteadiness on feet (R26.81);Other abnormalities of gait and mobility (R26.89);Muscle weakness (generalized) (M62.81)   Activity Tolerance Patient tolerated treatment well   Patient Left  (sitting on BSC--to call for A when she is finished)           Time: 9164-9141 OT Time Calculation (min): 23 min  Charges: OT General Charges $OT Visit: 1 Visit OT Treatments $Self Care/Home Management : 23-37 mins  Donny BECKER OT Acute Rehabilitation Services Office [phone]    [place] Dorothyann Distel 04/25/2024, 9:27 AM

## 2024-04-25 NOTE — Progress Notes (Signed)
 Physical Therapy Treatment Patient Details Name: Michele Owens MRN: [id number] DOB: 1960-04-17 Today's Date: 04/25/2024   History of Present Illness 64 y.o. presents to [facility] 04/20/24 with worsening SOB/cough. Admitted with severe sepsis 2/2 PNA complicated with acute on chronic hypoxic respiratory failure. Prior admit 12/14-12/16 for PNA. PMHx: obesity, neuropathy, HTN, OSA, COPD, hypoxic RF on 2L [place], CHF, gout, bipolar, SVT, migraines GERD, hx of DVT, anxiety depression   HFpEF, asthma,tobacco use, HLD, pulmonary edema, thyroid  CA, anasarca    PT Comments  Pt received on BSC with NT present and finishing up bird bath. Pt was able to reach upper thighs, however, was unable to reach feet or back with assist requested. Pt was able to perform step-pivot to the bed with no AD, however, was unsteady and reaching for support. Encouraged pt to utilize RW with pt then able to ambulate ~48ft to the sink with supervision (does not like close guarding). When performing standing ADLs/hygenie, pt required BUE support from the sink. Returned to the bed with pt reporting fatigue and SOB, vitals stable. Pt will likely be using WC for longer distances upon d/c home due to fatigue and decreased activity tolerance. Continue to recommend OP PT with acute PT to follow.     If plan is discharge home, recommend the following: A little help with walking and/or transfers;Assist for transportation;Help with stairs or ramp for entrance   Can travel by private vehicle      Yes  Equipment Recommendations  None recommended by PT       Precautions / Restrictions Precautions Precautions: Fall Recall of Precautions/Restrictions: Intact Restrictions Weight Bearing Restrictions Per Provider Order: No     Mobility  Bed Mobility Overal bed mobility: Modified Independent Bed Mobility: Sit to Supine    Sit to supine: Modified independent (Device/Increase time)   Transfers Overall transfer level: Needs  assistance Equipment used: Rolling walker (2 wheels), None Transfers: Sit to/from Stand, Bed to chair/wheelchair/BSC Sit to Stand: Contact guard assist   Step pivot transfers: Contact guard assist     General transfer comment: able to stand from [facility] with no UE support and CGA. Step-pivot to bed with no UE support. Then utilized RW for short distance ambulation and return to bed    Ambulation/Gait Ambulation/Gait assistance: Supervision Gait Distance (Feet): 6 Feet (x2) Assistive device: Rolling walker (2 wheels) Gait Pattern/deviations: Step-through pattern, Decreased stride length Gait velocity: decr    General Gait Details: Low foot clearance with increased medial/lateral sway. Distance limited by fatigue      Balance Overall balance assessment: Needs assistance Sitting-balance support: No upper extremity supported, Feet supported Sitting balance-Leahy Scale: Good    Standing balance support: Bilateral upper extremity supported, During functional activity, Reliant on assistive device for balance Standing balance-Leahy Scale: Poor Standing balance comment: reliant on BUE support for static balance longer than 5 sec and for gait       Communication Communication Communication: No apparent difficulties  Cognition Arousal: Alert Behavior During Therapy: WFL for tasks assessed/performed   PT - Cognitive impairments: Safety/Judgement    Following commands: Intact      Cueing Cueing Techniques: Verbal cues         Pertinent Vitals/Pain Pain Assessment Pain Assessment: No/denies pain     PT Goals (current goals can now be found in the care plan section) Acute Rehab PT Goals PT Goal Formulation: With patient Time For Goal Achievement: 05/07/24 Potential to Achieve Goals: Good Progress towards PT goals: Progressing toward goals  Frequency    Min 2X/week       AM-PAC PT 6 Clicks Mobility   Outcome Measure  Help needed turning from your back to your  side while in a flat bed without using bedrails?: None Help needed moving from lying on your back to sitting on the side of a flat bed without using bedrails?: A Little Help needed moving to and from a bed to a chair (including a wheelchair)?: A Little Help needed standing up from a chair using your arms (e.g., wheelchair or bedside chair)?: A Little Help needed to walk in hospital room?: A Lot Help needed climbing 3-5 steps with a railing? : Total 6 Click Score: 16    End of Session Equipment Utilized During Treatment: Oxygen  Activity Tolerance: Patient limited by fatigue Patient left: in bed;with call bell/phone within reach;with nursing/sitter in room (RN and NT present) Nurse Communication: Mobility status PT Visit Diagnosis: Unsteadiness on feet (R26.81);Other abnormalities of gait and mobility (R26.89);Muscle weakness (generalized) (M62.81);History of falling (Z91.81)     Time: 9051-8986 PT Time Calculation (min) (ACUTE ONLY): 25 min  Charges:    $Therapeutic Activity: 23-37 mins PT General Charges $$ ACUTE PT VISIT: 1 Visit                    Kate ORN, PT, DPT Secure Chat Preferred  Rehab Office [phone]   Kate BRAVO Wendolyn 04/25/2024, 10:43 AM

## 2024-04-25 NOTE — Progress Notes (Signed)
 "  [facility] PROGRESS NOTE   Michele Owens FMW:[id number] DOB: 06-28-1960 DOA: 04/20/2024  PCP: [name], MD  Brief History: 64 year old with past medical history significant for chronic diastolic heart failure, COPD, chronic hypoxic respiratory failure on 2 L of oxygen , diabetes type 2 who presents with worsening shortness of breath found to have severe sepsis due to pneumonia complicated with acute on chronic hypoxic respiratory failure.   Of note patient had a recent hospitalization from 03/23/2024 to 03/25/2024 for pneumonia treated with antibiotics.  Presented again 12/26: Complaining of worsening shortness of breath at that time discharged on doxycycline  as an outpatient.   She presented with worsening shortness of breath over the last week associated with increased cough. Evaluation in the ED consistent with COVID RSV PCR negative, chest x-ray showed interval worsening right middle lobe infiltrate concerning for worsening pneumonia.  Consultants: Pulmonology  Procedures: None  Subjective/Interval History: Patient mentioned that she continues to feel better.  Cough is improving.  Denies any chest pain.  No nausea vomiting.  Swelling in the leg has improved.  Wondering when she can go home.  Assessment/Plan:  Severe sepsis due to healthcare associated pneumonia: Patient with worsening cough, shortness of breath.  Chest x-ray showed worsening right middle lobe infiltrate.  RSV influenza and COVID-19 PCR negative.  Mycoplasma IgM was negative.  Strep pneumo urinary antigen and Legionella antigens were negative.  Blood cultures were negative.  Sputum culture from 1/11 was not acceptable for testing. Patient underwent CT chest which revealed patchy area of air spaces density in the right upper lobe as well as faint diffuse ground glass densities in the left lung.  Right middle lobe nodule was also noted. Back in December she was hospitalized and treated with  ceftriaxone  and doxycycline  and then discharged on Omnicef  on 03/25/2024.  She came back to the ED on December 22 with recurrent symptoms.  At that time she was prescribed doxycycline . Patient was seen by pulmonology who recommended continuing broad-spectrum antibiotics for 7 days.  She will complete her treatment tomorrow night.  Since she is improving there is no clear indication for bronchoscopy at this time. Continue linezolid  and cefepime .  Acute on chronic hypoxic respiratory failure - On chronic 2 L of oxygen  at home, currently requiring 3 L -Continue Anoro Ellipta , albuterol  as needed -Continue Pulmicort    Chronic diastolic heart failure - Diuretics were held during last hospitalization due to AKI.  This was supposed to be resumed by PCP on follow-up. Due to significant pedal edema patient was transitioned to IV furosemide .  She had good diuresis yesterday.  Continue IV furosemide  for another 24 hours.  Monitor electrolytes and renal function. Edema slightly better today.   History of COPD - Continue Anoro Ellipta , albuterol , Pulmicort    Diabetes type 2: - Continue sliding scale insulin .  CBGs fairly stable.  Occasional high readings noted.   Hyperlipidemia: - Continue with fenofibrate  and Crestor    Acquired hypothyroidism - Continue Synthroid    Allergic rhinitis - Continue Claritin    Chronic pain: Resume gabapentin  Anxiety: Continue  BuSpar , latuda .    Class III obesity Estimated body mass index is 56.42 kg/m as calculated from the following:   Height as of this encounter: 5' 5 (1.651 m).   Weight as of this encounter: 153.8 kg.  DVT Prophylaxis: Lovenox  Code Status: Full code Family Communication: Discussed with patient Disposition Plan: Patient PT.  Hopefully home in 24 to 48 hours.     Medications: Scheduled:  budesonide   0.25 mg Nebulization BID   busPIRone   30 mg Oral BID   enoxaparin  (LOVENOX ) injection  70 mg Subcutaneous Q24H   fenofibrate   54 mg  Oral Daily   furosemide   60 mg Intravenous BID   gabapentin   100 mg Oral BID   guaiFENesin   1,200 mg Oral BID   insulin  aspart  0-5 Units Subcutaneous QHS   insulin  aspart  0-9 Units Subcutaneous TID WC   levothyroxine   125 mcg Oral QHS   linezolid   600 mg Oral Q12H   loratadine   10 mg Oral QHS   lurasidone   20 mg Oral QHS   rosuvastatin   10 mg Oral QPM   umeclidinium-vilanterol  1 puff Inhalation Daily   Vitamin D  (Ergocalciferol )  50,000 Units Oral Q7 days   Continuous:  ceFEPime  (MAXIPIME ) IV 2 g (04/25/24 0639)   PRN:acetaminophen  **OR** acetaminophen , albuterol , benzonatate , HYDROcodone -acetaminophen , melatonin, ondansetron  (ZOFRAN ) IV, traZODone   Antibiotics: Anti-infectives (From admission, onward)    Start     Dose/Rate Route Frequency Ordered Stop   04/24/24 1030  linezolid  (ZYVOX ) tablet 600 mg        600 mg Oral Every 12 hours 04/24/24 0936 04/27/24 0959   04/22/24 2200  vancomycin  (VANCOREADY) IVPB 2000 mg/400 mL  Status:  Discontinued        2,000 mg 200 mL/hr over 120 Minutes Intravenous Every 24 hours 04/22/24 0712 04/24/24 0936   04/20/24 2200  vancomycin  (VANCOREADY) IVPB 1750 mg/350 mL  Status:  Discontinued        1,750 mg 175 mL/hr over 120 Minutes Intravenous Every 24 hours 04/20/24 0530 04/22/24 0712   04/20/24 1000  ceFEPIme  (MAXIPIME ) 2 g in sodium chloride  0.9 % 100 mL IVPB        2 g 200 mL/hr over 30 Minutes Intravenous Every 8 hours 04/20/24 0530 04/26/24 2359   04/20/24 0345  vancomycin  (VANCOREADY) IVPB 2000 mg/400 mL        2,000 mg 200 mL/hr over 120 Minutes Intravenous  Once 04/20/24 0330 04/20/24 0628   04/20/24 0330  vancomycin  (VANCOCIN ) IVPB 1000 mg/200 mL premix  Status:  Discontinued        1,000 mg 200 mL/hr over 60 Minutes Intravenous  Once 04/20/24 0328 04/20/24 0330   04/20/24 0330  ceFEPIme  (MAXIPIME ) 2 g in sodium chloride  0.9 % 100 mL IVPB        2 g 200 mL/hr over 30 Minutes Intravenous  Once 04/20/24 0328 04/20/24 0428        Objective:  Vital Signs  Vitals:   04/25/24 0058 04/25/24 0500 04/25/24 0600 04/25/24 0748  BP: (!) 106/53  (!) 112/56 138/81  Pulse: 80  89 89  Resp: 18  18 17   Temp: 98.6 F (37 C)  98 F (36.7 C) 97.8 F (36.6 C)  TempSrc: Oral  Oral Oral  SpO2:   94% 91%  Weight:  (!) 153.8 kg    Height:        Intake/Output Summary (Last 24 hours) at 04/25/2024 1108 Last data filed at 04/25/2024 1017 Gross per 24 hour  Intake 500 ml  Output 4200 ml  Net -3700 ml   Filed Weights   04/22/24 0500 04/23/24 0600 04/25/24 0500  Weight: (!) 154.2 kg (!) 153.7 kg (!) 153.8 kg    General appearance: Awake alert.  In no distress Resp: Improved aeration bilaterally.  No wheezing or rhonchi appreciated today. Cardio: S1-S2 is normal regular.  No S3-S4.  No rubs murmurs or bruit GI:  Abdomen is soft.  Nontender nondistended.  Bowel sounds are present normal.  No masses organomegaly Extremities: Still has 1+ edema bilateral lower extremity but improvement noted. Neurologic: Alert and oriented x3.  No focal neurological deficits.    Lab Results:  Data Reviewed: I have personally reviewed following labs and reports of the imaging studies  CBC: Recent Labs  Lab 04/20/24 0157 04/21/24 0912 04/23/24 0506 04/25/24 0408  WBC 12.1* 13.6* 11.6* 12.2*  NEUTROABS  --   --  8.7*  --   HGB 11.2* 10.5* 11.1* 11.1*  HCT 37.4 35.3* 37.2 36.8  MCV 103.3* 103.2* 101.1* 101.9*  PLT 253 227 253 257    Basic Metabolic Panel: Recent Labs  Lab 04/20/24 0157 04/22/24 0411 04/23/24 0506 04/25/24 0408  NA 140 141 143 139  K 4.9 3.9 3.8 4.3  CL 96* 92* 93* 89*  CO2 35* 40* 42* 40*  GLUCOSE 153* 132* 125* 122*  BUN 20 12 17 19   CREATININE 1.10* 0.71 0.71 0.82  CALCIUM  7.9* 8.6* 9.1 9.2  MG 1.9  --   --  1.9  PHOS 4.2  --   --   --     GFR: Estimated Creatinine Clearance: 104.7 mL/min (by C-G formula based on SCr of 0.82 mg/dL).  Liver Function Tests: Recent Labs  Lab 04/20/24 0157   AST 29  ALT 24  ALKPHOS 56  BILITOT 0.2  PROT 7.2  ALBUMIN  4.2    BNP (last 3 results) Recent Labs    01/16/24 2043 03/31/24 2042 04/20/24 0157  PROBNP 183.0 183.0 257.0   CBG: Recent Labs  Lab 04/24/24 0817 04/24/24 1216 04/24/24 1618 04/24/24 2155 04/25/24 0747  GLUCAP 216* 183* 137* 164* 193*    Recent Results (from the past 240 hours)  Resp panel by RT-PCR (RSV, Flu A&B, Covid) Anterior Nasal Swab     Status: None   Collection Time: 04/20/24  1:20 AM   Specimen: Anterior Nasal Swab  Result Value Ref Range Status   SARS Coronavirus 2 by RT PCR NEGATIVE NEGATIVE Final   Influenza A by PCR NEGATIVE NEGATIVE Final   Influenza B by PCR NEGATIVE NEGATIVE Final    Comment: (NOTE) The Xpert Xpress SARS-CoV-2/FLU/RSV plus assay is intended as an aid in the diagnosis of influenza from Nasopharyngeal swab specimens and should not be used as a sole basis for treatment. Nasal washings and aspirates are unacceptable for Xpert Xpress SARS-CoV-2/FLU/RSV testing.  Fact Sheet for Patients: bloggercourse.com  Fact Sheet for Healthcare Providers: seriousbroker.it  This test is not yet approved or cleared by the [place]  FDA and has been authorized for detection and/or diagnosis of SARS-CoV-2 by FDA under an Emergency Use Authorization (EUA). This EUA will remain in effect (meaning this test can be used) for the duration of the COVID-19 declaration under Section 564(b)(1) of the Act, 21 U.S.C. section 360bbb-3(b)(1), unless the authorization is terminated or revoked.     Resp Syncytial Virus by PCR NEGATIVE NEGATIVE Final    Comment: (NOTE) Fact Sheet for Patients: bloggercourse.com  Fact Sheet for Healthcare Providers: seriousbroker.it  This test is not yet approved or cleared by the [place]  FDA and has been authorized for detection and/or diagnosis of  SARS-CoV-2 by FDA under an Emergency Use Authorization (EUA). This EUA will remain in effect (meaning this test can be used) for the duration of the COVID-19 declaration under Section 564(b)(1) of the Act, 21 U.S.C. section 360bbb-3(b)(1), unless the authorization is terminated or revoked.  Performed  at [facility], [address] [place], [place] [address]   Expectorated Sputum Assessment w Gram Stain, Rflx to Resp Cult     Status: None   Collection Time: 04/20/24  3:40 AM   Specimen: Sputum  Result Value Ref Range Status   Specimen Description SPUTUM  Final   Special Requests Normal  Final   Sputum evaluation   Final    Sputum specimen not acceptable for testing.  Please recollect.   Gram Stain Report Called to,Read Back By and Verified With: RN K. PUDD [id number] @ 2026 FH Performed at [facility], [address] [place], [place] [address]    Report Status 04/23/2024 FINAL  Final  Blood culture (routine x 2)     Status: None   Collection Time: 04/20/24  4:05 AM   Specimen: BLOOD LEFT ARM  Result Value Ref Range Status   Specimen Description BLOOD LEFT ARM  Final   Special Requests   Final    BOTTLES DRAWN AEROBIC AND ANAEROBIC Blood Culture results may not be optimal due to an inadequate volume of blood received in culture bottles   Culture   Final    NO GROWTH 5 DAYS Performed at [facility], [address] [place], [place] [address]    Report Status 04/25/2024 FINAL  Final  Blood culture (routine x 2)     Status: None   Collection Time: 04/20/24  4:12 AM   Specimen: BLOOD LEFT ARM  Result Value Ref Range Status   Specimen Description BLOOD LEFT ARM  Final   Special Requests   Final    BOTTLES DRAWN AEROBIC AND ANAEROBIC Blood Culture results may not be optimal due to an inadequate volume of blood received in culture bottles   Culture   Final    NO GROWTH 5 DAYS Performed at [facility], [address], [place], [place] [address]    Report  Status 04/25/2024 FINAL  Final  MRSA Next Gen by PCR, Nasal     Status: None   Collection Time: 04/20/24  4:45 AM   Specimen: Nasal Mucosa; Nasal Swab  Result Value Ref Range Status   MRSA by PCR Next Gen NOT DETECTED NOT DETECTED Final    Comment: (NOTE) The GeneXpert MRSA Assay (FDA approved for NASAL specimens only), is one component of a comprehensive MRSA colonization surveillance program. It is not intended to diagnose MRSA infection nor to guide or monitor treatment for MRSA infections. Test performance is not FDA approved in patients less than 43 years old. Performed at [facility], [address], [place], [place] [address]       Radiology Studies: No results found.      LOS: 5 days   Siraj Dermody  [facility] Pager on [email or web address]  04/25/2024, 11:08 AM   "

## 2024-04-26 DIAGNOSIS — R600 Localized edema: Secondary | ICD-10-CM | POA: Diagnosis not present

## 2024-04-26 DIAGNOSIS — J189 Pneumonia, unspecified organism: Secondary | ICD-10-CM | POA: Diagnosis not present

## 2024-04-26 LAB — BASIC METABOLIC PANEL WITH GFR
Anion gap: 8 (ref 5–15)
BUN: 17 mg/dL (ref 8–23)
CO2: 42 mmol/L — ABNORMAL HIGH (ref 22–32)
Calcium: 9.2 mg/dL (ref 8.9–10.3)
Chloride: 89 mmol/L — ABNORMAL LOW (ref 98–111)
Creatinine, Ser: 0.72 mg/dL (ref 0.44–1.00)
GFR, Estimated: >60 mL/min
Glucose, Bld: 141 mg/dL — ABNORMAL HIGH (ref 70–99)
Potassium: 3.6 mmol/L (ref 3.5–5.1)
Sodium: 139 mmol/L (ref 135–145)

## 2024-04-26 LAB — GLUCOSE, CAPILLARY
Glucose-Capillary: 225 mg/dL — ABNORMAL HIGH (ref 70–99)
Glucose-Capillary: 117 mg/dL — ABNORMAL HIGH (ref 70–99)
Glucose-Capillary: 217 mg/dL — ABNORMAL HIGH (ref 70–99)
Glucose-Capillary: 176 mg/dL — ABNORMAL HIGH (ref 70–99)

## 2024-04-26 MED ORDER — LIDOCAINE 4 % EX PTCH: 1.0000 | MEDICATED_PATCH | CUTANEOUS | 0 refills | Status: AC

## 2024-04-26 MED ORDER — FERROUS SULFATE 325 (65 FE) MG PO TABS
325.0000 mg | ORAL_TABLET | Freq: Every day | ORAL | Status: DC
  Administered 2024-04-26 – 2024-04-27 (×2): 325 mg via ORAL
  Filled 2024-04-26 (×2): qty 1

## 2024-04-26 MED ORDER — HYDROCODONE-ACETAMINOPHEN 5-325 MG PO TABS: 1.0000 | ORAL_TABLET | Freq: Three times a day (TID) | ORAL | 0 refills | Status: AC | PRN

## 2024-04-26 MED ORDER — POTASSIUM CHLORIDE CRYS ER 20 MEQ PO TBCR
40.0000 meq | EXTENDED_RELEASE_TABLET | Freq: Once | ORAL | Status: AC
  Administered 2024-04-26: 40 meq via ORAL
  Filled 2024-04-26: qty 2

## 2024-04-26 NOTE — Progress Notes (Signed)
 "  [facility] PROGRESS NOTE   Michele Owens FMW:[id number] DOB: Aug 09, 1960 DOA: 04/20/2024  PCP: [name], MD  Brief History: 64 year old with past medical history significant for chronic diastolic heart failure, COPD, chronic hypoxic respiratory failure on 2 L of oxygen , diabetes type 2 who presents with worsening shortness of breath found to have severe sepsis due to pneumonia complicated with acute on chronic hypoxic respiratory failure. Of note patient had a recent hospitalization from 03/23/2024 to 03/25/2024 for pneumonia treated with antibiotics.  Presented again 12/26: Complaining of worsening shortness of breath at that time discharged on doxycycline  as an outpatient. She presented with worsening shortness of breath over the last week associated with increased cough. Evaluation in the ED consistent with COVID RSV PCR negative, chest x-ray showed interval worsening right middle lobe infiltrate concerning for worsening pneumonia.  Consultants: Pulmonology  Procedures: None  Subjective/Interval History: Patient mentioned that she is feeling better.  Denies any shortness of breath or cough this morning.  Leg swelling is also improving.  No nausea vomiting.  Looking forward to going home tomorrow.    Assessment/Plan:  Severe sepsis due to healthcare associated pneumonia: Patient with worsening cough, shortness of breath.  Chest x-ray showed worsening right middle lobe infiltrate.  RSV influenza and COVID-19 PCR negative.  Mycoplasma IgM was negative.  Strep pneumo urinary antigen and Legionella antigens were negative.  Blood cultures were negative.  Sputum culture from 1/11 was not acceptable for testing. Patient underwent CT chest which revealed patchy area of air spaces density in the right upper lobe as well as faint diffuse ground glass densities in the left lung.  Right middle lobe nodule was also noted. Back in December she was hospitalized and treated with  ceftriaxone  and doxycycline  and then discharged on Omnicef  on 03/25/2024.  She came back to the ED on December 22 with recurrent symptoms.  At that time she was prescribed doxycycline . Patient was seen by pulmonology who recommended continuing broad-spectrum antibiotics for 7 days.  She will complete her treatment tonight.   Since she is improving there is no clear indication for bronchoscopy at this time. Continue linezolid  and cefepime .  Acute on chronic hypoxic respiratory failure - On chronic 2 L of oxygen  at home, currently requiring 3 L -Continue Anoro Ellipta , albuterol  as needed -Continue Pulmicort    Acute on chronic diastolic heart failure Diuretics were held during last hospitalization due to AKI.  This was supposed to be resumed by PCP on follow-up. Due to significant pedal edema patient was transitioned to IV furosemide .  She appears to be diuresing well though urine output is not accurately charted.  Weight is decreasing.  Continue IV furosemide  for today and then she can resume her torsemide  at discharge.   Lower extremity edema is improving. Supplement potassium.   History of COPD - Continue Anoro Ellipta , albuterol , Pulmicort    Diabetes type 2: - Continue sliding scale insulin .  CBGs fairly stable.  Occasional high readings noted.   Hyperlipidemia: - Continue with fenofibrate  and Crestor    Acquired hypothyroidism - Continue Synthroid    Allergic rhinitis - Continue Claritin    Chronic pain: Resume gabapentin  Anxiety: Continue  BuSpar , latuda .    Class III obesity Estimated body mass index is 56.18 kg/m as calculated from the following:   Height as of this encounter: 5' 5 (1.651 m).   Weight as of this encounter: 153.1 kg.  DVT Prophylaxis: Lovenox  Code Status: Full code Family Communication: Discussed with patient Disposition Plan: Outpatient PT. anticipate  discharge tomorrow     Medications: Scheduled:  budesonide   0.25 mg Nebulization BID   busPIRone    30 mg Oral BID   enoxaparin  (LOVENOX ) injection  70 mg Subcutaneous Q24H   fenofibrate   54 mg Oral Daily   ferrous sulfate   325 mg Oral Daily   furosemide   60 mg Intravenous BID   gabapentin   100 mg Oral BID   guaiFENesin   1,200 mg Oral BID   insulin  aspart  0-5 Units Subcutaneous QHS   insulin  aspart  0-9 Units Subcutaneous TID WC   levothyroxine   125 mcg Oral QHS   lidocaine   1 patch Transdermal Q24H   linezolid   600 mg Oral Q12H   loratadine   10 mg Oral QHS   lurasidone   20 mg Oral QHS   rosuvastatin   10 mg Oral QPM   umeclidinium-vilanterol  1 puff Inhalation Daily   Vitamin D  (Ergocalciferol )  50,000 Units Oral Q7 days   Continuous:  ceFEPime  (MAXIPIME ) IV 2 g (04/26/24 0535)   PRN:acetaminophen  **OR** acetaminophen , albuterol , benzonatate , HYDROcodone -acetaminophen , melatonin, ondansetron  (ZOFRAN ) IV, traZODone   Antibiotics: Anti-infectives (From admission, onward)    Start     Dose/Rate Route Frequency Ordered Stop   04/24/24 1030  linezolid  (ZYVOX ) tablet 600 mg        600 mg Oral Every 12 hours 04/24/24 0936 04/27/24 0959   04/22/24 2200  vancomycin  (VANCOREADY) IVPB 2000 mg/400 mL  Status:  Discontinued        2,000 mg 200 mL/hr over 120 Minutes Intravenous Every 24 hours 04/22/24 0712 04/24/24 0936   04/20/24 2200  vancomycin  (VANCOREADY) IVPB 1750 mg/350 mL  Status:  Discontinued        1,750 mg 175 mL/hr over 120 Minutes Intravenous Every 24 hours 04/20/24 0530 04/22/24 0712   04/20/24 1000  ceFEPIme  (MAXIPIME ) 2 g in sodium chloride  0.9 % 100 mL IVPB        2 g 200 mL/hr over 30 Minutes Intravenous Every 8 hours 04/20/24 0530 04/26/24 2359   04/20/24 0345  vancomycin  (VANCOREADY) IVPB 2000 mg/400 mL        2,000 mg 200 mL/hr over 120 Minutes Intravenous  Once 04/20/24 0330 04/20/24 0628   04/20/24 0330  vancomycin  (VANCOCIN ) IVPB 1000 mg/200 mL premix  Status:  Discontinued        1,000 mg 200 mL/hr over 60 Minutes Intravenous  Once 04/20/24 0328 04/20/24  0330   04/20/24 0330  ceFEPIme  (MAXIPIME ) 2 g in sodium chloride  0.9 % 100 mL IVPB        2 g 200 mL/hr over 30 Minutes Intravenous  Once 04/20/24 0328 04/20/24 0428       Objective:  Vital Signs  Vitals:   04/26/24 0054 04/26/24 0200 04/26/24 0518 04/26/24 0832  BP: 111/61  115/72 123/80  Pulse: 95 (!) 46 95 92  Resp: 18  18 17   Temp: 98.8 F (37.1 C)  97.7 F (36.5 C) 97.8 F (36.6 C)  TempSrc: Oral  Oral Oral  SpO2: 91% 93% 95%   Weight:   (!) 153.1 kg   Height:        Intake/Output Summary (Last 24 hours) at 04/26/2024 1035 Last data filed at 04/26/2024 0100 Gross per 24 hour  Intake 300 ml  Output --  Net 300 ml   Filed Weights   04/23/24 0600 04/25/24 0500 04/26/24 0518  Weight: (!) 153.7 kg (!) 153.8 kg (!) 153.1 kg    General appearance: Awake alert.  In no distress  Resp: Clear to auscultation bilaterally.  Normal effort Cardio: S1-S2 is normal regular.  No S3-S4.  No rubs murmurs or bruit GI: Abdomen is soft.  Nontender nondistended.  Bowel sounds are present normal.  No masses organomegaly Extremities: Improving lower extremity edema.  Lab Results:  Data Reviewed: I have personally reviewed following labs and reports of the imaging studies  CBC: Recent Labs  Lab 04/20/24 0157 04/21/24 0912 04/23/24 0506 04/25/24 0408  WBC 12.1* 13.6* 11.6* 12.2*  NEUTROABS  --   --  8.7*  --   HGB 11.2* 10.5* 11.1* 11.1*  HCT 37.4 35.3* 37.2 36.8  MCV 103.3* 103.2* 101.1* 101.9*  PLT 253 227 253 257    Basic Metabolic Panel: Recent Labs  Lab 04/20/24 0157 04/22/24 0411 04/23/24 0506 04/25/24 0408 04/26/24 0406  NA 140 141 143 139 139  K 4.9 3.9 3.8 4.3 3.6  CL 96* 92* 93* 89* 89*  CO2 35* 40* 42* 40* 42*  GLUCOSE 153* 132* 125* 122* 141*  BUN 20 12 17 19 17   CREATININE 1.10* 0.71 0.71 0.82 0.72  CALCIUM  7.9* 8.6* 9.1 9.2 9.2  MG 1.9  --   --  1.9  --   PHOS 4.2  --   --   --   --     GFR: Estimated Creatinine Clearance: 107 mL/min (by C-G  formula based on SCr of 0.72 mg/dL).  Liver Function Tests: Recent Labs  Lab 04/20/24 0157  AST 29  ALT 24  ALKPHOS 56  BILITOT 0.2  PROT 7.2  ALBUMIN  4.2    BNP (last 3 results) Recent Labs    01/16/24 2043 03/31/24 2042 04/20/24 0157  PROBNP 183.0 183.0 257.0   CBG: Recent Labs  Lab 04/25/24 0747 04/25/24 1251 04/25/24 1602 04/25/24 2058 04/26/24 0751  GLUCAP 193* 149* 125* 206* 225*    Recent Results (from the past 240 hours)  Resp panel by RT-PCR (RSV, Flu A&B, Covid) Anterior Nasal Swab     Status: None   Collection Time: 04/20/24  1:20 AM   Specimen: Anterior Nasal Swab  Result Value Ref Range Status   SARS Coronavirus 2 by RT PCR NEGATIVE NEGATIVE Final   Influenza A by PCR NEGATIVE NEGATIVE Final   Influenza B by PCR NEGATIVE NEGATIVE Final    Comment: (NOTE) The Xpert Xpress SARS-CoV-2/FLU/RSV plus assay is intended as an aid in the diagnosis of influenza from Nasopharyngeal swab specimens and should not be used as a sole basis for treatment. Nasal washings and aspirates are unacceptable for Xpert Xpress SARS-CoV-2/FLU/RSV testing.  Fact Sheet for Patients: bloggercourse.com  Fact Sheet for Healthcare Providers: seriousbroker.it  This test is not yet approved or cleared by the [place]  FDA and has been authorized for detection and/or diagnosis of SARS-CoV-2 by FDA under an Emergency Use Authorization (EUA). This EUA will remain in effect (meaning this test can be used) for the duration of the COVID-19 declaration under Section 564(b)(1) of the Act, 21 U.S.C. section 360bbb-3(b)(1), unless the authorization is terminated or revoked.     Resp Syncytial Virus by PCR NEGATIVE NEGATIVE Final    Comment: (NOTE) Fact Sheet for Patients: bloggercourse.com  Fact Sheet for Healthcare Providers: seriousbroker.it  This test is not yet approved or  cleared by the [place]  FDA and has been authorized for detection and/or diagnosis of SARS-CoV-2 by FDA under an Emergency Use Authorization (EUA). This EUA will remain in effect (meaning this test can be used) for the duration of the  COVID-19 declaration under Section 564(b)(1) of the Act, 21 U.S.C. section 360bbb-3(b)(1), unless the authorization is terminated or revoked.  Performed at [facility], [address] [place], [place] [address]   Expectorated Sputum Assessment w Gram Stain, Rflx to Resp Cult     Status: None   Collection Time: 04/20/24  3:40 AM   Specimen: Sputum  Result Value Ref Range Status   Specimen Description SPUTUM  Final   Special Requests Normal  Final   Sputum evaluation   Final    Sputum specimen not acceptable for testing.  Please recollect.   Gram Stain Report Called to,Read Back By and Verified With: RN K. PUDD [id number] @ 2026 FH Performed at [facility], [address], [place], [place] [address]    Report Status 04/23/2024 FINAL  Final  Blood culture (routine x 2)     Status: None   Collection Time: 04/20/24  4:05 AM   Specimen: BLOOD LEFT ARM  Result Value Ref Range Status   Specimen Description BLOOD LEFT ARM  Final   Special Requests   Final    BOTTLES DRAWN AEROBIC AND ANAEROBIC Blood Culture results may not be optimal due to an inadequate volume of blood received in culture bottles   Culture   Final    NO GROWTH 5 DAYS Performed at [facility], [address], [place], [place] [address]    Report Status 04/25/2024 FINAL  Final  Blood culture (routine x 2)     Status: None   Collection Time: 04/20/24  4:12 AM   Specimen: BLOOD LEFT ARM  Result Value Ref Range Status   Specimen Description BLOOD LEFT ARM  Final   Special Requests   Final    BOTTLES DRAWN AEROBIC AND ANAEROBIC Blood Culture results may not be optimal due to an inadequate volume of blood received in culture bottles   Culture   Final    NO GROWTH 5  DAYS Performed at [facility], [address] [place], [place] [address]    Report Status 04/25/2024 FINAL  Final  MRSA Next Gen by PCR, Nasal     Status: None   Collection Time: 04/20/24  4:45 AM   Specimen: Nasal Mucosa; Nasal Swab  Result Value Ref Range Status   MRSA by PCR Next Gen NOT DETECTED NOT DETECTED Final    Comment: (NOTE) The GeneXpert MRSA Assay (FDA approved for NASAL specimens only), is one component of a comprehensive MRSA colonization surveillance program. It is not intended to diagnose MRSA infection nor to guide or monitor treatment for MRSA infections. Test performance is not FDA approved in patients less than 60 years old. Performed at [facility], [address], [place], [place] [address]       Radiology Studies: No results found.      LOS: 6 days   Michele Owens  [facility] Pager on [email or web address]  04/26/2024, 10:35 AM   "

## 2024-04-26 NOTE — Plan of Care (Signed)

## 2024-04-26 NOTE — Plan of Care (Signed)
" °  Problem: Education: Goal: Knowledge of General Education information will improve Description: Including pain rating scale, medication(s)/side effects and non-pharmacologic comfort measures Outcome: Progressing   Problem: Clinical Measurements: Goal: Respiratory complications will improve Outcome: Not Progressing Goal: Cardiovascular complication will be avoided Outcome: Progressing   Problem: Clinical Measurements: Goal: Cardiovascular complication will be avoided Outcome: Progressing   Problem: Coping: Goal: Level of anxiety will decrease Outcome: Progressing   Problem: Pain Managment: Goal: General experience of comfort will improve and/or be controlled Outcome: Progressing   Problem: Safety: Goal: Ability to remain free from injury will improve Outcome: Progressing   "

## 2024-04-27 ENCOUNTER — Other Ambulatory Visit (HOSPITAL_COMMUNITY): Payer: Self-pay

## 2024-04-27 DIAGNOSIS — J189 Pneumonia, unspecified organism: Secondary | ICD-10-CM | POA: Diagnosis not present

## 2024-04-27 LAB — BASIC METABOLIC PANEL WITH GFR
Anion gap: 5 (ref 5–15)
BUN: 19 mg/dL (ref 8–23)
CO2: 45 mmol/L — ABNORMAL HIGH (ref 22–32)
Calcium: 9.2 mg/dL (ref 8.9–10.3)
Chloride: 91 mmol/L — ABNORMAL LOW (ref 98–111)
Creatinine, Ser: 0.74 mg/dL (ref 0.44–1.00)
GFR, Estimated: >60 mL/min
Glucose, Bld: 121 mg/dL — ABNORMAL HIGH (ref 70–99)
Potassium: 3.9 mmol/L (ref 3.5–5.1)
Sodium: 140 mmol/L (ref 135–145)

## 2024-04-27 LAB — GLUCOSE, CAPILLARY: Glucose-Capillary: 171 mg/dL — ABNORMAL HIGH (ref 70–99)

## 2024-04-27 MED ORDER — GUAIFENESIN ER 600 MG PO TB12: 1200.0000 mg | ORAL_TABLET | Freq: Two times a day (BID) | ORAL | 0 refills | Status: AC

## 2024-04-27 MED ORDER — TORSEMIDE 40 MG PO TABS: 80.0000 mg | ORAL_TABLET | Freq: Two times a day (BID) | ORAL | 1 refills | Status: AC

## 2024-04-27 NOTE — TOC Transition Note (Addendum)
 Transition of Care [facility]) - Discharge Note   Patient Details  Name: Michele Owens MRN: [id number] Date of Birth: 06-05-1960  [facility] CM/SW Contact:  Sudie [name], RN Phone Number: 04/27/2024, 9:31 AM   Clinical Narrative: ICM has received notification that the patient will need a humidification canister for her home oxygen . ICM did speak with [employer] Liaison Jake and this will be delivered to the patient on Monday. Patient in need of PTAR home. Will notify [facility] now for transport.   1049 PT[facility] will transport home within the hour. No further needs identified.   Final next level of care: OP Rehab Barriers to Discharge: No Barriers Identified   Patient Goals and CMS Choice Patient states their goals for this hospitalization and ongoing recovery are:: Patient wants to return home with OPPT          Discharge Placement                       Discharge Plan and Services Additional resources added to the After Visit Summary for     Discharge Planning Services: CM Consult Post Acute Care Choice: NA                               Social Drivers of Health (SDOH) Interventions SDOH Screenings   Food Insecurity: No Food Insecurity (04/20/2024)  Housing: High Risk (04/20/2024)  Transportation Needs: Unmet Transportation Needs (04/20/2024)  Utilities: Not At Risk (04/20/2024)  Alcohol Screen: Low Risk (10/24/2022)  Depression (PHQ2-9): Medium Risk (12/25/2023)  Financial Resource Strain: Low Risk (10/24/2022)  Social Connections: Moderately Isolated (04/20/2024)  Tobacco Use: Medium Risk (04/20/2024)     Readmission Risk Interventions    04/22/2024    3:19 PM 03/25/2024   12:35 PM 01/23/2024   12:36 PM  Readmission Risk Prevention Plan  Transportation Screening Complete Complete Complete  Medication Review (RN Care Manager) Referral to Pharmacy Referral to Pharmacy Complete  PCP or Specialist appointment within 3-5 days of discharge   Complete Complete  HRI or Home Care Consult Complete Complete Complete  SW Recovery Care/Counseling Consult Complete Complete   Palliative Care Screening Not Applicable Not Applicable Not Applicable  Skilled Nursing Facility Not Applicable Not Applicable Not Applicable

## 2024-04-27 NOTE — Plan of Care (Signed)

## 2024-04-27 NOTE — Plan of Care (Signed)
  Problem: Clinical Measurements: Goal: Respiratory complications will improve Outcome: Progressing   Problem: Activity: Goal: Risk for activity intolerance will decrease Outcome: Progressing   Problem: Elimination: Goal: Will not experience complications related to bowel motility Outcome: Progressing   Problem: Pain Managment: Goal: General experience of comfort will improve and/or be controlled Outcome: Progressing   Problem: Safety: Goal: Ability to remain free from injury will improve Outcome: Progressing

## 2024-04-27 NOTE — Discharge Summary (Signed)
 " [facility]  Physician Discharge Summary   Patient ID: Michele Owens MRN: [id number] DOB/AGE: [birth year] 64 y.o.  Admit date: 04/20/2024 Discharge date: 04/27/2024    PCP: [name], MD  DISCHARGE DIAGNOSES:    HCAP (healthcare-associated pneumonia)   DM2 (diabetes mellitus, type 2) (HCC)   Acquired hypothyroidism   HLD (hyperlipidemia)   Acute on chronic hypoxic respiratory failure (HCC)   Chronic diastolic CHF (congestive heart failure) (HCC)   History of COPD   Allergic rhinitis   RECOMMENDATIONS FOR OUTPATIENT FOLLOW UP: Follow-up with outpatient providers   Home Health: None Equipment/Devices: None  CODE STATUS: Full code  DISCHARGE CONDITION: fair  Diet recommendation: As before  INITIAL HISTORY: 64 year old with past medical history significant for chronic diastolic heart failure, COPD, chronic hypoxic respiratory failure on 2 L of oxygen , diabetes type 2 who presents with worsening shortness of breath found to have severe sepsis due to pneumonia complicated with acute on chronic hypoxic respiratory failure. Of note patient had a recent hospitalization from 03/23/2024 to 03/25/2024 for pneumonia treated with antibiotics.  Presented again 12/26: Complaining of worsening shortness of breath at that time discharged on doxycycline  as an outpatient. She presented with worsening shortness of breath over the last week associated with increased cough. Evaluation in the ED consistent with COVID RSV PCR negative, chest x-ray showed interval worsening right middle lobe infiltrate concerning for worsening pneumonia.   Consultants: Pulmonology   Procedures: None  HOSPITAL COURSE:   Severe sepsis due to healthcare associated pneumonia: Patient with worsening cough, shortness of breath.  Chest x-ray showed worsening right middle lobe infiltrate.  RSV influenza and COVID-19 PCR negative.  Mycoplasma IgM was negative.  Strep pneumo urinary antigen and  Legionella antigens were negative.  Blood cultures were negative.  Sputum culture from 1/11 was not acceptable for testing. Patient underwent CT chest which revealed patchy area of air spaces density in the right upper lobe as well as faint diffuse ground glass densities in the left lung.  Right middle lobe nodule was also noted. Back in December she was hospitalized and treated with ceftriaxone  and doxycycline  and then discharged on Omnicef  on 03/25/2024.  She came back to the ED on December 22 with recurrent symptoms.  At that time she was prescribed doxycycline . Patient was seen by pulmonology who recommended continuing broad-spectrum antibiotics for 7 days.  She was initially on vancomycin  and cefepime .  Vancomycin  was changed to linezolid .  She has completed 7 days of treatment.  Her symptoms have improved.  She wants to go home today.  Okay for discharge.     Acute on chronic hypoxic respiratory failure Continue home oxygen .   Acute on chronic diastolic heart failure Diuretics were held during last hospitalization due to AKI.  This was supposed to be resumed by PCP on follow-up. Due to significant pedal edema patient was transitioned to IV furosemide .  He diuresed well.  Edema is improved.  Resume torsemide  at discharge.  Supplement potassium.   History of COPD - Continue Anoro Ellipta , albuterol , Pulmicort    Diabetes type 2:   Hyperlipidemia: - Continue with fenofibrate  and Crestor    Acquired hypothyroidism - Continue Synthroid    Allergic rhinitis - Continue Claritin    Chronic pain: Resume gabapentin  Anxiety: Continue  BuSpar , latuda .    Class III obesity Estimated body mass index is 56.18 kg/m as calculated from the following:   Height as of this encounter: 5' 5 (1.651 m).   Weight as of this encounter: 153.1 kg.  Patient is stable.  Okay for discharge home today.   PERTINENT LABS:  The results of significant diagnostics from this hospitalization (including imaging,  microbiology, ancillary and laboratory) are listed below for reference.    Microbiology: Recent Results (from the past 240 hours)  Resp panel by RT-PCR (RSV, Flu A&B, Covid) Anterior Nasal Swab     Status: None   Collection Time: 04/20/24  1:20 AM   Specimen: Anterior Nasal Swab  Result Value Ref Range Status   SARS Coronavirus 2 by RT PCR NEGATIVE NEGATIVE Final   Influenza A by PCR NEGATIVE NEGATIVE Final   Influenza B by PCR NEGATIVE NEGATIVE Final    Comment: (NOTE) The Xpert Xpress SARS-CoV-2/FLU/RSV plus assay is intended as an aid in the diagnosis of influenza from Nasopharyngeal swab specimens and should not be used as a sole basis for treatment. Nasal washings and aspirates are unacceptable for Xpert Xpress SARS-CoV-2/FLU/RSV testing.  Fact Sheet for Patients: bloggercourse.com  Fact Sheet for Healthcare Providers: seriousbroker.it  This test is not yet approved or cleared by the [place]  FDA and has been authorized for detection and/or diagnosis of SARS-CoV-2 by FDA under an Emergency Use Authorization (EUA). This EUA will remain in effect (meaning this test can be used) for the duration of the COVID-19 declaration under Section 564(b)(1) of the Act, 21 U.S.C. section 360bbb-3(b)(1), unless the authorization is terminated or revoked.     Resp Syncytial Virus by PCR NEGATIVE NEGATIVE Final    Comment: (NOTE) Fact Sheet for Patients: bloggercourse.com  Fact Sheet for Healthcare Providers: seriousbroker.it  This test is not yet approved or cleared by the [place]  FDA and has been authorized for detection and/or diagnosis of SARS-CoV-2 by FDA under an Emergency Use Authorization (EUA). This EUA will remain in effect (meaning this test can be used) for the duration of the COVID-19 declaration under Section 564(b)(1) of the Act, 21 U.S.C. section  360bbb-3(b)(1), unless the authorization is terminated or revoked.  Performed at [facility], [address], [place], [place] [address]   Expectorated Sputum Assessment w Gram Stain, Rflx to Resp Cult     Status: None   Collection Time: 04/20/24  3:40 AM   Specimen: Sputum  Result Value Ref Range Status   Specimen Description SPUTUM  Final   Special Requests Normal  Final   Sputum evaluation   Final    Sputum specimen not acceptable for testing.  Please recollect.   Gram Stain Report Called to,Read Back By and Verified With: RN K. PUDD [id number] @ 2026 FH Performed at [facility], [address], [place], [place] [address]    Report Status 04/23/2024 FINAL  Final  Blood culture (routine x 2)     Status: None   Collection Time: 04/20/24  4:05 AM   Specimen: BLOOD LEFT ARM  Result Value Ref Range Status   Specimen Description BLOOD LEFT ARM  Final   Special Requests   Final    BOTTLES DRAWN AEROBIC AND ANAEROBIC Blood Culture results may not be optimal due to an inadequate volume of blood received in culture bottles   Culture   Final    NO GROWTH 5 DAYS Performed at [facility], [address] [place], [place] [address]    Report Status 04/25/2024 FINAL  Final  Blood culture (routine x 2)     Status: None   Collection Time: 04/20/24  4:12 AM   Specimen: BLOOD LEFT ARM  Result Value  Ref Range Status   Specimen Description BLOOD LEFT ARM  Final   Special Requests   Final    BOTTLES DRAWN AEROBIC AND ANAEROBIC Blood Culture results may not be optimal due to an inadequate volume of blood received in culture bottles   Culture   Final    NO GROWTH 5 DAYS Performed at [facility], [address] [place], [place] [address]    Report Status 04/25/2024 FINAL  Final  MRSA Next Gen by PCR, Nasal     Status: None   Collection Time: 04/20/24  4:45 AM   Specimen: Nasal Mucosa; Nasal Swab  Result Value Ref Range Status   MRSA by PCR Next Gen NOT DETECTED NOT  DETECTED Final    Comment: (NOTE) The GeneXpert MRSA Assay (FDA approved for NASAL specimens only), is one component of a comprehensive MRSA colonization surveillance program. It is not intended to diagnose MRSA infection nor to guide or monitor treatment for MRSA infections. Test performance is not FDA approved in patients less than 23 years old. Performed at [facility], [address], [place], [place] [address]      Labs:   Basic Metabolic Panel: Recent Labs  Lab 04/22/24 0411 04/23/24 0506 04/25/24 0408 04/26/24 0406 04/27/24 0319  NA 141 143 139 139 140  K 3.9 3.8 4.3 3.6 3.9  CL 92* 93* 89* 89* 91*  CO2 40* 42* 40* 42* 45*  GLUCOSE 132* 125* 122* 141* 121*  BUN 12 17 19 17 19   CREATININE 0.71 0.71 0.82 0.72 0.74  CALCIUM  8.6* 9.1 9.2 9.2 9.2  MG  --   --  1.9  --   --     CBC: Recent Labs  Lab 04/23/24 0506 04/25/24 0408  WBC 11.6* 12.2*  NEUTROABS 8.7*  --   HGB 11.1* 11.1*  HCT 37.2 36.8  MCV 101.1* 101.9*  PLT 253 257    BNP: BNP (last 3 results) Recent Labs    03/12/24 2311 03/19/24 1318 03/23/24 1344  BNP 12.1 8.7 6.9    ProBNP (last 3 results) Recent Labs    01/16/24 2043 03/31/24 2042 04/20/24 0157  PROBNP 183.0 183.0 257.0    CBG: Recent Labs  Lab 04/26/24 0751 04/26/24 1202 04/26/24 1618 04/26/24 2033 04/27/24 0816  GLUCAP 225* 117* 217* 176* 171*     IMAGING STUDIES CT CHEST WO CONTRAST Result Date: 04/22/2024 CLINICAL DATA:  Recent pneumonia. EXAM: CT CHEST WITHOUT CONTRAST TECHNIQUE: Multidetector CT imaging of the chest was performed following the standard protocol without IV contrast. RADIATION DOSE REDUCTION: This exam was performed according to the departmental dose-optimization program which includes automated exposure control, adjustment of the mA and/or kV according to patient size and/or use of iterative reconstruction technique. COMPARISON:  Chest CT dated 03/23/2024. FINDINGS: Evaluation of this exam  is limited in the absence of intravenous contrast. Cardiovascular: There is no cardiomegaly or pericardial effusion. Mild atherosclerotic calcification of the thoracic aorta. No aneurysmal dilatation. The central pulmonary arteries are grossly unremarkable. Mediastinum/Nodes: No hilar or mediastinal adenopathy. The esophagus is grossly unremarkable. No mediastinal fluid collection. Lungs/Pleura: There is a 12 mm right middle lobe nodule, present on the prior CT. Follow-up as per recommendation of prior CT. Bilateral streaky atelectasis. Patchy area of airspace density in the right upper lobe as well as faint diffuse ground-glass density in the left lung concerning for pneumonia. No pleural effusion or pneumothorax. The central airways are patent. Upper Abdomen: No acute abnormality. Musculoskeletal: Osteopenia  with degenerative changes of the spine. No acute osseous pathology. IMPRESSION: 1. Patchy area of airspace density in the right upper lobe as well as faint diffuse ground-glass density in the left lung concerning for pneumonia. 2. A 12 mm right middle lobe nodule. Follow-up as per recommendation of prior CT. 3.  Aortic Atherosclerosis (ICD10-I70.0). Electronically Signed   By: Vanetta Chou M.D.   On: 04/22/2024 15:19   DG Swallowing Func-Speech Pathology Result Date: 04/21/2024 Table formatting from the original result was not included. Modified Barium Swallow Study Patient Details Name: Wynonna Fitzhenry MRN: [id number] Date of Birth: 02-06-1961 Today's Date: 04/21/2024 HPI/PMH: HPI: Tyffany Waldrop is a 64 y.o. female who is admitted to [facility] on 04/20/2024 with severe sepsis due to HCAP pneumonia complicated by acute on chronic hypoxic respiratory failure after presenting from home to [facility] ED complaining of shortness of breath. Pt with recent admissions for pna x2 in Decembers.  She reports additional cases of pna over the past year including summer time. CXR 1/11:  Progressive  consolidation within the right mid lung zone. Pt with medical history significant for chronic diastolic heart failure, COPD, chronic Evoxac respiratory failure on 2 L continuous nasal cannula, type 2 diabetes mellitus. Clinical Impression: Pt presents with grossly normal swallow function.  There was trace, transient penetration of thin liquids only, which is [facility].  There was no aspiration of any consistencies trialed.  There was trace residue. Esophageal sweep could not be completed 2/2 use of C-Arm unit to complete this study.  There was no retention or backflow of contrast in cervical esophagus. Recommend continuing regular texture diet with thin liquids. DIGEST Swallow Severity Rating*  Safety: 0  Efficiency: 0  Overall Pharyngeal Swallow Severity: 0 1: mild; 2: moderate; 3: severe; 4: profound *The Dynamic Imaging Grade of Swallowing Toxicity is standardized for the head and neck cancer population, however, demonstrates promising clinical applications across populations to standardize the clinical rating of pharyngeal swallow safety and severity. Factors that may increase risk of adverse event in presence of aspiration Noe & Lianne 2021): No data recorded Recommendations/Plan: Swallowing Evaluation Recommendations Swallowing Evaluation Recommendations Recommendations: PO diet PO Diet Recommendation: Regular; Thin liquids (Level 0) Liquid Administration via: Cup; Straw Medication Administration: Whole meds with liquid Supervision: Patient able to self-feed Postural changes: Position pt fully upright for meals Oral care recommendations: Staff/trained caregiver to provide oral care Treatment Plan Treatment Plan Treatment recommendations: Therapy as outlined in treatment plan below Follow-up recommendations: No SLP follow up Functional status assessment: Patient has not had a recent decline in their functional status. Treatment frequency: -- (N/A) Recommendations Recommendations for follow up therapy are one  component of a multi-disciplinary discharge planning process, led by the attending physician.  Recommendations may be updated based on patient status, additional functional criteria and insurance authorization. Assessment: Orofacial Exam: Orofacial Exam Oral Cavity: Oral Hygiene: WFL Oral Cavity - Dentition: Adequate natural dentition; Missing dentition Orofacial Anatomy: WFL Oral Motor/Sensory Function: -- (See BSE) Anatomy: Anatomy: WFL Boluses Administered: Boluses Administered Boluses Administered: Thin liquids (Level 0); Puree; Solid  Oral Impairment Domain: Oral Impairment Domain Lip Closure: No labial escape Tongue control during bolus hold: Not tested Bolus preparation/mastication: Timely and efficient chewing and mashing Bolus transport/lingual motion: Brisk tongue motion Oral residue: Trace residue lining oral structures Location of oral residue : Tongue Initiation of pharyngeal swallow : Posterior laryngeal surface of the epiglottis  Pharyngeal Impairment Domain: Pharyngeal Impairment Domain Soft palate elevation: No bolus between soft palate (  SP)/pharyngeal wall (PW) Laryngeal elevation: Complete superior movement of thyroid  cartilage with complete approximation of arytenoids to epiglottic petiole Anterior hyoid excursion: Complete anterior movement Epiglottic movement: Complete inversion Laryngeal vestibule closure: Complete, no air/contrast in laryngeal vestibule Pharyngeal stripping wave : Present - complete Pharyngoesophageal segment opening: Complete distension and complete duration, no obstruction of flow Tongue base retraction: Trace column of contrast or air between tongue base and PPW Pharyngeal residue: Trace residue within or on pharyngeal structures Location of pharyngeal residue: Valleculae  Esophageal Impairment Domain: Esophageal Impairment Domain Esophageal clearance upright position: -- (unable to assess) Pill: Pill Consistency administered: Thin liquids (Level 0) Penetration/Aspiration  Scale Score: Penetration/Aspiration Scale Score 1.  Material does not enter airway: Puree; Solid; Pill 2.  Material enters airway, remains ABOVE vocal cords then ejected out: Thin liquids (Level 0) Compensatory Strategies: Compensatory Strategies Compensatory strategies: No   General Information: Caregiver present: No  Diet Prior to this Study: Regular; Thin liquids (Level 0)   Temperature : Normal   Respiratory Status: WFL   Supplemental O2: Nasal cannula   History of Recent Intubation: No  Behavior/Cognition: Alert; Cooperative Self-Feeding Abilities: Able to self-feed Baseline vocal quality/speech: -- (harsh) No data recorded Volitional Swallow: Able to elicit Exam Limitations: No limitations Goal Planning: Prognosis for improved oropharyngeal function: -- (N/A) No data recorded No data recorded Patient/Family Stated Goal: no deficits/none stated Consulted and agree with results and recommendations: Patient; Physician Pain: Pain Assessment Pain Assessment: -- (none reported) End of Session: Start Time:SLP Start Time (ACUTE ONLY): 1010 Stop Time: SLP Stop Time (ACUTE ONLY): 1028 Time Calculation:SLP Time Calculation (min) (ACUTE ONLY): 18 min Charges: SLP Evaluations $ SLP Speech Visit: 1 Visit SLP Evaluations $BSS Swallow: 1 Procedure $MBS Swallow: 1 Procedure SLP visit diagnosis: SLP Visit Diagnosis: Dysphagia, unspecified (R13.10) Past Medical History: Past Medical History: Diagnosis Date  Anxiety   Arthritis   Asthma   Chronic diastolic CHF (congestive heart failure) (HCC)   COPD (chronic obstructive pulmonary disease) (HCC)   Uses Oxygen  at night  Depression   Diabetes mellitus without complication (HCC)   GERD (gastroesophageal reflux disease)   Gout   Headache   migraines  History of DVT of lower extremity   Hypertension   Morbid obesity with BMI of 45.0-49.9, adult (HCC)   Thyroid  cancer (HCC) 09/23/2016 Past Surgical History: Past Surgical History: Procedure Laterality Date  BUNIONECTOMY Bilateral    CARDIAC CATHETERIZATION N/A 06/23/2015  Procedure: Right/Left Heart Cath and Coronary Angiography;  Surgeon: Ezra GORMAN Shuck, MD;  Location: [facility] INVASIVE CV LAB;  Service: Cardiovascular;  Laterality: N/A;  COLONOSCOPY WITH PROPOFOL  N/A 12/15/2015  Procedure: COLONOSCOPY WITH PROPOFOL ;  Surgeon: Norleen Hint, MD;  Location: [facility] ENDOSCOPY;  Service: Endoscopy;  Laterality: N/A;  RIGHT HEART CATH N/A 10/01/2019  Procedure: RIGHT HEART CATH;  Surgeon: Shuck Ezra GORMAN, MD;  Location: [facility] INVASIVE CV LAB;  Service: Cardiovascular;  Laterality: N/A;  RIGHT HEART CATH N/A 03/03/2024  Procedure: RIGHT HEART CATH;  Surgeon: Shuck Ezra GORMAN, MD;  Location: [facility] INVASIVE CV LAB;  Service: Cardiovascular;  Laterality: N/A;  THYROIDECTOMY  09/19/2016  THYROIDECTOMY N/A 09/19/2016  Procedure: TOTAL THYROIDECTOMY;  Surgeon: Eletha Boas, MD;  Location: [facility] OR;  Service: General;  Laterality: N/A;  TONSILLECTOMY    TOTAL ABDOMINAL HYSTERECTOMY  07/14/10 Anette FORBES Grippe, MA, CCC-SLP Acute Rehabilitation Services Office: [phone] 04/21/2024, 11:29 AM  DG C-Arm 1-60 Min-No Report Result Date: 04/21/2024 Fluoroscopy was utilized by the requesting physician.  No radiographic interpretation.   DG Foot  2 Views Left Result Date: 04/20/2024 EXAM: 2 VIEW(S) XRAY OF THE LEFT FOOT 04/20/2024 01:49:38 AM COMPARISON: None available. CLINICAL HISTORY: sob FINDINGS: BONES AND JOINTS: No acute fracture. No malalignment. Plantar calcaneal spur. Degenerative changes of the distal interphalangeal joint of the third toe. Degenerative changes of the tarsometatarsal joints. SOFT TISSUES: Dorsal forefoot subcutaneous soft tissue edema. IMPRESSION: 1. Dorsal forefoot subcutaneous soft tissue edema. 2. Third ray and midfoot degenerative changes Electronically signed by: Dorethia Molt MD MD 04/20/2024 01:57 AM EST RP Workstation: [id number]   DG Chest Port 1 View Result Date: 04/20/2024 EXAM: 1 VIEW(S) XRAY OF THE CHEST 04/20/2024 01:49:38 AM COMPARISON:  03/31/2024 CLINICAL HISTORY: sob FINDINGS: LUNGS AND PLEURA: Low lung volumes. Progressive consolidation within the right mid lung zone. Grossly stable 8 mm pulmonary nodule within the peripheral right lung base, noted on CT examination of 03/23/2024. No pleural effusion. No pneumothorax. HEART AND MEDIASTINUM: Cardiomegaly. Aortic atherosclerosis. BONES AND SOFT TISSUES: No acute osseous abnormality. IMPRESSION: 1. Progressive consolidation within the right mid lung zone. 2. Follow-up chest radiograph is recommended in 3-4 weeks, following conservative therapy, to document resolution. If persistent at that time, dedicated contrast enhanced CT imaging of the chest is recommended for further evaluation. 3. Stable 8 mm pulmonary nodule within the peripheral right lung base. 4. Cardiomegaly Electronically signed by: Dorethia Molt MD MD 04/20/2024 01:54 AM EST RP Workstation: [id number]   DG Chest Portable 1 View Result Date: 03/31/2024 CLINICAL DATA:  Chest pain and shortness of breath. EXAM: PORTABLE CHEST 1 VIEW COMPARISON:  Radiograph and CT 8 days ago 03/23/2024 FINDINGS: Mild cardiomegaly stable. Mediastinal contours are unchanged. Increasing opacity in the perifissural right upper lobe. Mild patchy bibasilar opacities are similar. No pleural effusion or pneumothorax technically limited due to soft tissue attenuation from habitus. IMPRESSION: 1. Increasing opacity in the perifissural right upper lobe, suspicious for pneumonia. 2. Mild patchy bibasilar opacities are similar, favor atelectasis. Electronically Signed   By: Andrea Gasman M.D.   On: 03/31/2024 20:44    DISCHARGE EXAMINATION: Vitals:   04/26/24 2010 04/26/24 2330 04/27/24 0603 04/27/24 0822  BP:  (!) 142/84 (!) 134/91 116/70  Pulse: 80 90 88 84  Resp: 15  18 18   Temp:  98.4 F (36.9 C) 97.7 F (36.5 C) 98.8 F (37.1 C)  TempSrc:  Oral Oral Oral  SpO2: 91% 93% 92% (!) 87%  Weight:   (!) 153.9 kg   Height:       General  appearance: Awake alert.  In no distress Resp: Clear to auscultation bilaterally.  Normal effort Cardio: S1-S2 is normal regular.  No S3-S4.  No rubs murmurs or bruit GI: Abdomen is soft.  Nontender nondistended.  Bowel sounds are present normal.  No masses organomegaly   DISPOSITION: Home  Discharge Instructions     (HEART FAILURE PATIENTS) Call MD:  Anytime you have any of the following symptoms: 1) 3 pound weight gain in 24 hours or 5 pounds in 1 week 2) shortness of breath, with or without a dry hacking cough 3) swelling in the hands, feet or stomach 4) if you have to sleep on extra pillows at night in order to breathe.   Complete by: As directed    Ambulatory referral to Physical Therapy   Complete by: As directed    Outpatient Physical Therapy evaluation and treatment   Call MD for:  difficulty breathing, headache or visual disturbances   Complete by: As directed    Call MD for:  extreme fatigue  Complete by: As directed    Call MD for:  persistant dizziness or light-headedness   Complete by: As directed    Call MD for:  persistant nausea and vomiting   Complete by: As directed    Call MD for:  severe uncontrolled pain   Complete by: As directed    Call MD for:  temperature >100.4   Complete by: As directed    Diet - low sodium heart healthy   Complete by: As directed    Discharge instructions   Complete by: As directed    Please take the medications as prescribed.  Please be sure to follow-up with your primary care provider and with your cardiologist.  You were cared for by a hospitalist during your hospital stay. If you have any questions about your discharge medications or the care you received while you were in the hospital after you are discharged, you can call the unit and asked to speak with the hospitalist on call if the hospitalist that took care of you is not available. Once you are discharged, your primary care physician will handle any further medical issues.  Please note that NO REFILLS for any discharge medications will be authorized once you are discharged, as it is imperative that you return to your primary care physician (or establish a relationship with a primary care physician if you do not have one) for your aftercare needs so that they can reassess your need for medications and monitor your lab values. If you do not have a primary care physician, you can call [phone] for a physician referral.   Increase activity slowly   Complete by: As directed    No wound care   Complete by: As directed          Allergies as of 04/27/2024       Reactions   Bee Venom Anaphylaxis   All over my body (swelling)   Fioricet  [butalbital -apap-caffeine ] Nausea And Vomiting, Rash   Ibuprofen  Rash, Other (See Comments)   Severe rash   Lamisil  [terbinafine ] Rash, Other (See Comments)   Pt states this causes her to feel funny   Nsaids Other (See Comments)   Per MD's orders         Medication List     PAUSE taking these medications    Torsemide  40 MG Tabs Wait to take this until your doctor or other care provider tells you to start again. Take 80 mg by mouth 2 (two) times daily. What changed: medication strength       STOP taking these medications    cefdinir  300 MG capsule Commonly known as: OMNICEF    doxycycline  100 MG capsule Commonly known as: MONODOX    metolazone  2.5 MG tablet Commonly known as: ZAROXOLYN    potassium chloride  10 MEQ tablet Commonly known as: KLOR-CON  M   rizatriptan  10 MG tablet Commonly known as: Maxalt        TAKE these medications    Accu-Chek Guide Test test strip Generic drug: glucose blood USE TO CHECK BLOOD SUGAR 3 TIMES DAILY   Accu-Chek Softclix Lancets lancets Use to check blood sugar 3 times daily   acetaminophen  650 MG CR tablet Commonly known as: TYLENOL  Take 1,950 mg by mouth every 8 (eight) hours as needed for pain.   albuterol  108 (90 Base) MCG/ACT inhaler Commonly known as:  VENTOLIN  HFA Inhale 1-2 puffs into the lungs every 6 (six) hours as needed for wheezing or shortness of breath. What changed: Another medication with the same name  was changed. Make sure you understand how and when to take each.   albuterol  (2.5 MG/3ML) 0.083% nebulizer solution Commonly known as: PROVENTIL  Take 3 mLs (2.5 mg total) by nebulization every 6 (six) hours as needed for wheezing or shortness of breath. What changed: when to take this   allopurinol  100 MG tablet Commonly known as: ZYLOPRIM  Take 1 tablet (100 mg total) by mouth daily.   aspirin  EC 81 MG tablet Take 1 tablet (81 mg total) by mouth daily. Swallow whole.   blood glucose meter kit and supplies Kit Dispense based on patient and insurance preference. Use up to four times daily as directed.   Blood Glucose Monitoring Suppl Devi 1 each by Does not apply route in the morning, at noon, and at bedtime. May substitute to any manufacturer covered by patient's insurance.   busPIRone  30 MG tablet Commonly known as: BUSPAR  Take 1 tablet (30 mg total) by mouth 2 (two) times daily.   butalbital -acetaminophen -caffeine  50-325-40 MG tablet Commonly known as: FIORICET  Take 1 tablet by mouth every 6 (six) hours as needed for headache.   carvedilol  6.25 MG tablet Commonly known as: COREG  Take 1 tablet (6.25 mg total) by mouth 2 (two) times daily.   clotrimazole  1 % cream Commonly known as: LOTRIMIN  Apply topically 2 (two) times daily.   diclofenac  Sodium 1 % Gel Commonly known as: VOLTAREN  Apply 4 g topically 4 (four) times daily.   dicyclomine  20 MG tablet Commonly known as: BENTYL  Take 1 tablet (20 mg total) by mouth 2 (two) times daily.   doxepin  10 MG capsule Commonly known as: SINEQUAN  Take 10 mg by mouth at bedtime.   EPINEPHrine  0.3 mg/0.3 mL Soaj injection Commonly known as: EPI-PEN Inject 0.3 mg into the muscle once as needed for anaphylaxis.   fenofibrate  145 MG tablet Commonly known as:  TRICOR  Take 1 tablet (145 mg total) by mouth daily.   FeroSul 325 (65 Fe) MG tablet Generic drug: ferrous sulfate  Take 1 tablet (325 mg total) by mouth daily.   gabapentin  100 MG capsule Commonly known as: NEURONTIN  Take 1 capsule (100 mg total) by mouth 2 (two) times daily.   guaiFENesin  600 MG 12 hr tablet Commonly known as: MUCINEX  Take 2 tablets (1,200 mg total) by mouth 2 (two) times daily.   HYDROcodone -acetaminophen  5-325 MG tablet Commonly known as: NORCO/VICODIN Take 1 tablet by mouth every 8 (eight) hours as needed for severe pain (pain score 7-10).   levocetirizine 5 MG tablet Commonly known as: XYZAL  TAKE 1 TABLET (5 MG TOTAL) BY MOUTH AT BEDTIME.   levothyroxine  125 MCG tablet Commonly known as: SYNTHROID  TAKE 1 TABLET (125 MCG TOTAL) BY MOUTH AT BEDTIME.   lidocaine  4 % Commonly known as: HM Lidocaine  Patch Place 1 patch onto the skin daily.   losartan  25 MG tablet Commonly known as: COZAAR  Take 1 tablet (25 mg total) by mouth daily.   lurasidone  20 MG Tabs tablet Commonly known as: LATUDA  Take 1 tablet (20 mg total) by mouth at bedtime. Take with a snack. This is for mood and sleep.   nitroGLYCERIN  0.4 MG SL tablet Commonly known as: NITROSTAT  Place 1 tablet (0.4 mg total) under the tongue every 5 (five) minutes as needed for chest pain.   omeprazole  20 MG capsule Commonly known as: PRILOSEC TAKE 2 CAPSULES (40 MG TOTAL) BY MOUTH DAILY (2AM)   ondansetron  4 MG disintegrating tablet Commonly known as: ZOFRAN -ODT Take 1 tablet (4 mg total) by mouth every 8 (eight) hours  as needed for nausea or vomiting.   OXYGEN  Inhale 2 L into the lungs continuous.   potassium chloride  10 MEQ tablet Commonly known as: KLOR-CON  Take 10 mEq by mouth 2 (two) times daily. You may have different instructions for this medication elsewhere on this list. Ask your doctor how you should be taking this medication.   rosuvastatin  10 MG tablet Commonly known as:  CRESTOR  Take 1 tablet (10 mg total) by mouth every evening.   umeclidinium-vilanterol 62.5-25 MCG/ACT Aepb Commonly known as: ANORO ELLIPTA  Inhale 1 puff into the lungs daily.   Vitamin D  (Ergocalciferol ) 1.25 MG (50000 UNIT) Caps capsule Commonly known as: DRISDOL  Take 1 capsule (50,000 Units total) by mouth every 7 (seven) days.          Follow-up Information     [facility] Follow up.   Specialty: Rehabilitation Why: Outpatient Physical Therapy-office to call within 3-5 business days. If the office has not called; please call the office to schedule an appointment. Contact information: [address] [place]   [address] [phone]        [name], MD. Schedule an appointment as soon as possible for a visit in 1 week(s).   Specialty: Family Medicine Why: post hospitalization follow up Contact information: [address] [place] [address] [phone]                 TOTAL DISCHARGE TIME: 35 minutes  Erlene Devita  [facility] Pager on [email or web address]  04/28/2024, 10:36 AM    "

## 2024-04-28 ENCOUNTER — Telehealth: Payer: Self-pay

## 2024-04-28 NOTE — Transitions of Care (Post Inpatient/ED Visit) (Signed)
" ° °  04/28/2024  Name: Michele Owens MRN: [id number] DOB: Aug 29, 1960  Today's TOC FU Call Status: Today's TOC FU Call Status:: Unsuccessful Call (1st Attempt) Unsuccessful Call (1st Attempt) Date: 04/28/24  Attempted to reach the patient regarding the most recent Inpatient/ED visit.  Can't talk right now  Follow Up Plan: Additional outreach attempts will be made to reach the patient to complete the Transitions of Care (Post Inpatient/ED visit) call.   Richerd Fish, RN, BSN, CCM [facility], [facility] Management Coordinator Direct Dial: [phone]        "

## 2024-05-01 ENCOUNTER — Telehealth: Payer: Self-pay

## 2024-05-01 NOTE — Transitions of Care (Post Inpatient/ED Visit) (Signed)
 "  05/01/2024  Name: Michele Owens MRN: [id number] DOB: 12-Jul-1960  Today's TOC FU Call Status: Today's TOC FU Call Status:: Successful TOC FU Call Completed TOC FU Call Complete Date: 05/01/24  Patient's Name and Date of Birth confirmed. Name, DOB  Transition Care Management Follow-up Telephone Call Date of Discharge: 04/27/24 Discharge Facility: [facility]) Type of Discharge: Inpatient Admission Primary Inpatient Discharge Diagnosis:: Community Acquired Pneumonia How have you been since you were released from the hospital?: Better Any questions or concerns?: No  Items Reviewed: Did you receive and understand the discharge instructions provided?: Yes Medications obtained,verified, and reconciled?: No (My meds are in my bubble pack and it seems to me the pharmacy should have made these changes in my bubble pack cause I don't know what you're talking about) Medications Not Reviewed Reasons:: Advised Patient to Call Provider Office Do you have support at home?: Yes People in Home [RPT]: alone Name of Support/Comfort Primary Source: Aide - Renee  Medications Reviewed Today:   Patient states my medicines are bubbled packed and if it's in my bubble pack I've been taking it, declined to go through medication list at this time Bubble packs thru [facility] Asked patient if she felt her aide could assist her with reading medications on AVS DC instructions to stop and or start. She states, my aide has nothing to do with my medicines attempted to redirect about not giving her the medications but reading what's on her sheet. Then patient states, I guess Charlies could look over it and help me to know what to stop and see if I have the new medicine. I know they told me to get the cough medicine over the counter that I would have to pay for that.  Medications Reviewed Today   Medications were not reviewed in this encounter     Home Care and Equipment/Supplies: Were Home Health Services  Ordered?: NA ([facility] rehab to start has vouchers) Any new equipment or medical supplies ordered?: NA  Functional Questionnaire:  Declining to answer states, I called the rehab and will be starting that soon they have vouchers for me when I am ready   Follow up appointments reviewed: PCP Follow-up appointment confirmed?: Yes Date of PCP follow-up appointment?: 05/08/24 (I WANT TO SEE MY DOCTOR!!! DR. BEVERLEY HUMMER!!) Follow-up Provider: Billy Knee, FNP Do you need transportation to your follow-up appointment?: No Do you understand care options if your condition(s) worsen?: Yes-patient verbalized understanding (I am going to take my sheet to my appointment next week and MY DOCTOR is going to go over this with ME!!)  Reviewed s/s of infection Education for self-mgmt of post hospital pneumonia provided during this outreach regarding: -s/s of worsening condition and when to seek medical attention -importance of completing all post discharge hospital hospital follow up appts -adherence to med regimen [facility] TOC 30-day program enrollment reviewed and discussed with pt/caregiver. They have declined enrollment in 30 day TOC program due to:  I see what ya'll trying to do to get me to see another doctor but, I want to see MY DOCTOR, DR. BEVERLEY HUMMER! I'm gonna stop talking cause I see what ya'll trying to do.  Attempts to redirect about how this follow up call is trying to help with or correct. States,  Well, when you get Dr. Hummer for me and about any medicines he want me to take then you can call back.  Will reach out to PCP office about patient wants only Dr. Beverley Hummer  for follow up. 11:32 am Called [place] [facility] [place] and spoke with Viola regarding patient's appointment currently showing Rosina Senters and patient requesting to see only Dr. Beverley Hummer, a request is to be made to see if provider can accommodate patient request and they will reach out to patient with update.    Richerd Fish, RN, BSN, CCM [facility], [facility] RN Care Manager Direct Dial: [phone]        "

## 2024-05-04 IMAGING — CR DG KNEE COMPLETE 4+V*L*
4 series · 4 of 4 positions shown · non-contrast
Comparison: 04/27/2021.

CLINICAL DATA: Left knee pain.

EXAM:
LEFT KNEE - COMPLETE 4+ VIEW

[knee ap]
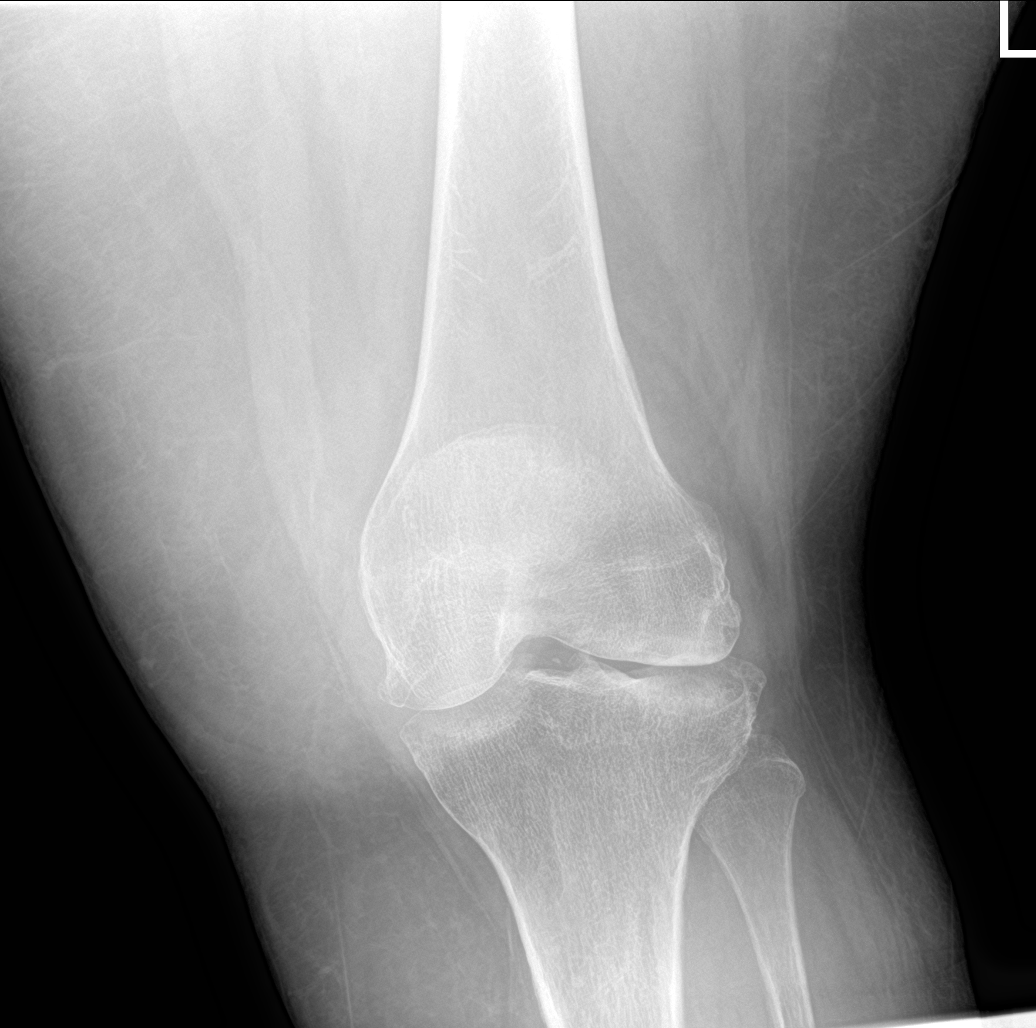

[knee lat]
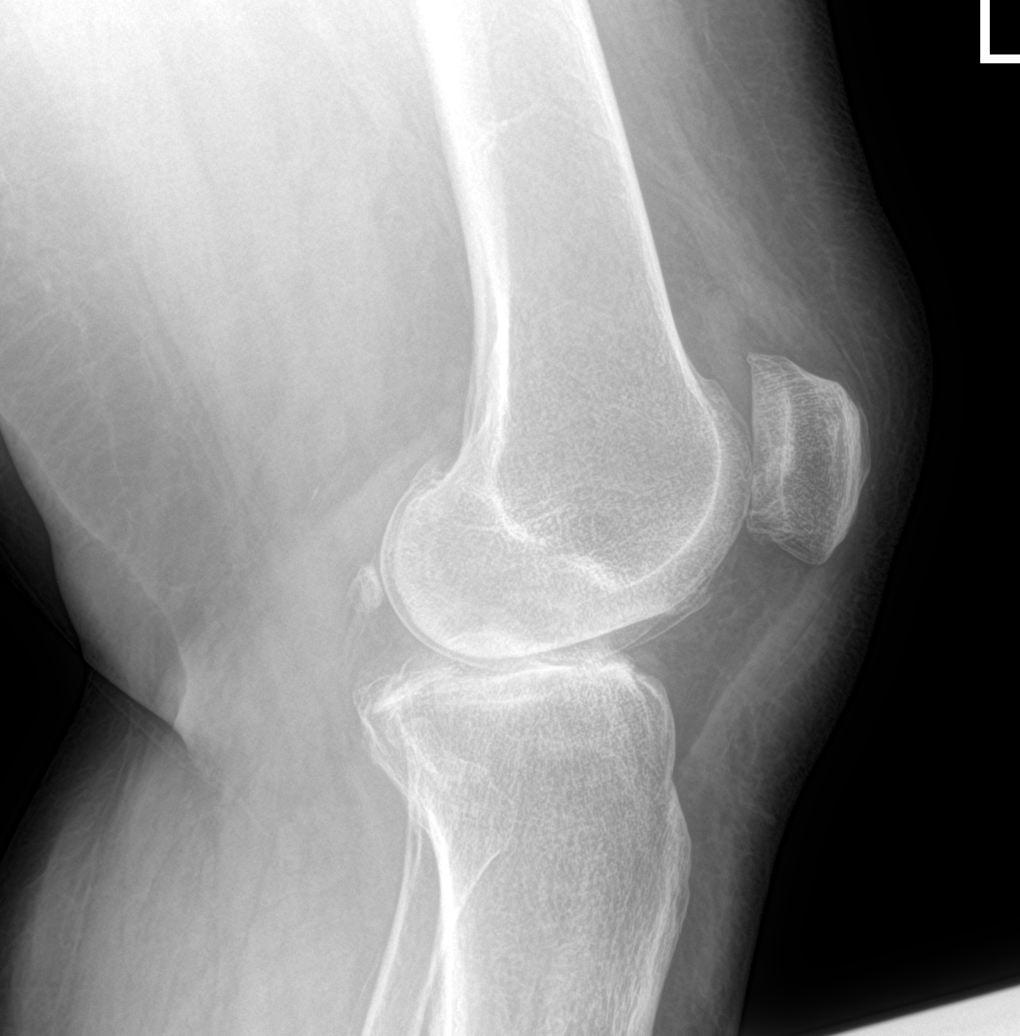

[knee obl (1 of 2)]
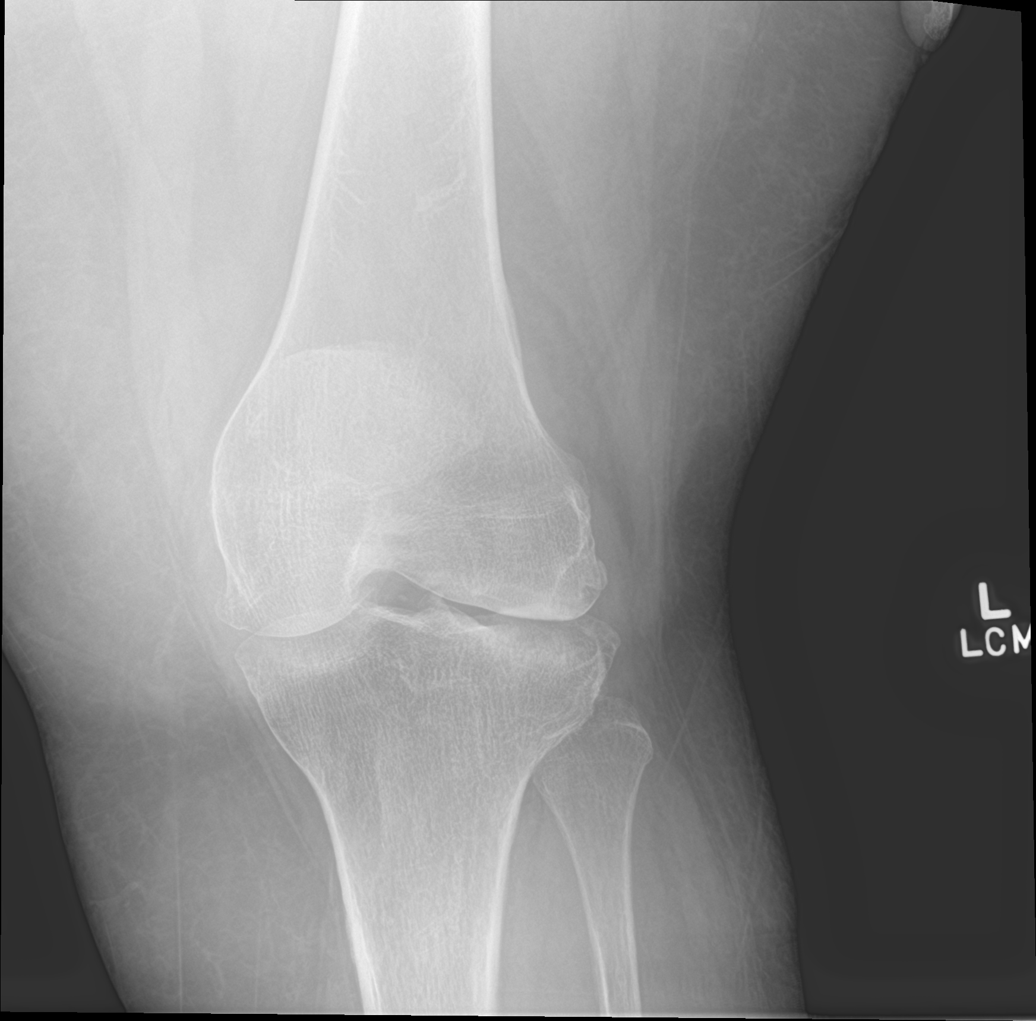

[knee obl (2 of 2)]
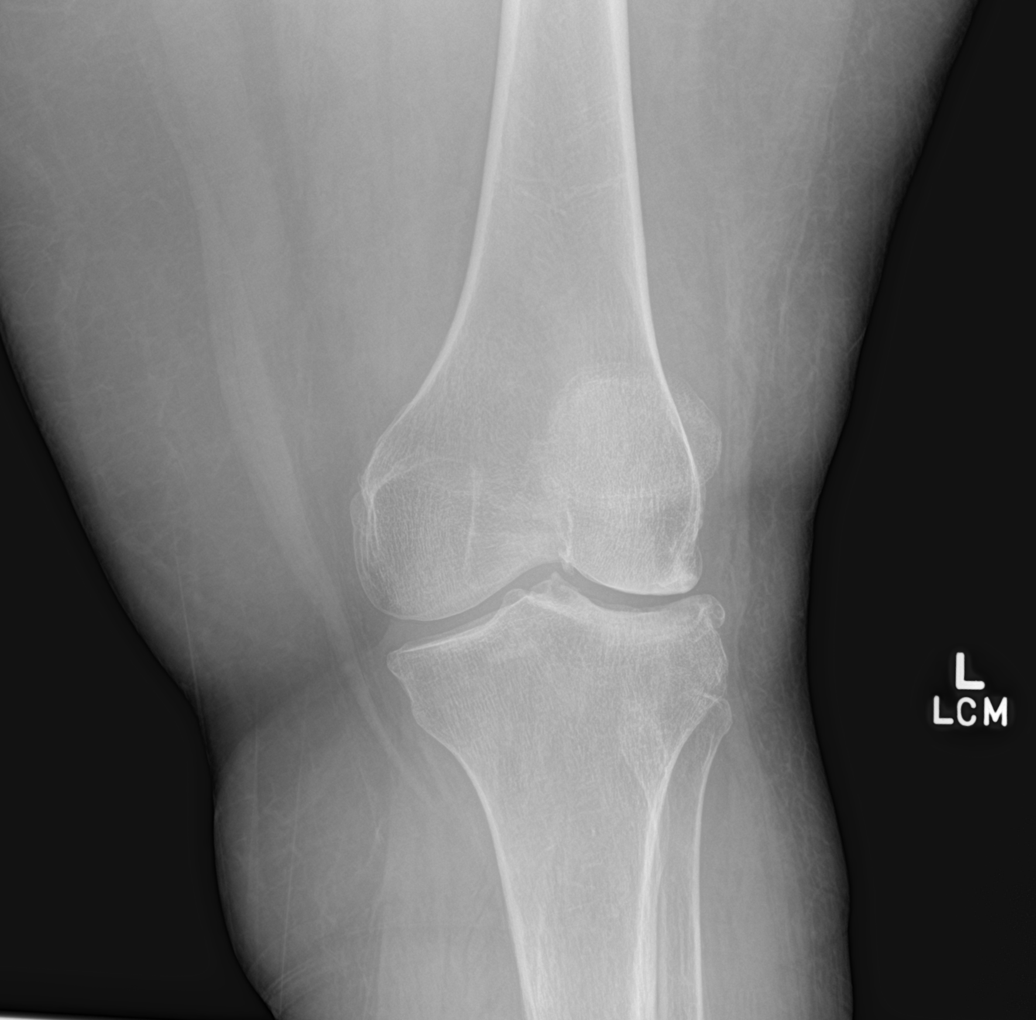

[4 of 4 positions shown; findings below may reference images not displayed]

FINDINGS: No acute fracture or dislocation. There are mild to moderate
tricompartmental degenerative changes. A trace suprapatellar joint
effusion is noted
IMPRESSION: 1. Mild-to-moderate degenerative changes.
2. Trace suprapatellar joint effusion.

## 2024-05-04 IMAGING — CR DG CHEST 2V
2 series · 2 of 2 positions shown · non-contrast
Comparison: 06/26/2021.

CLINICAL DATA: Chest pain.

EXAM:
CHEST - 2 VIEW

[chest lat]
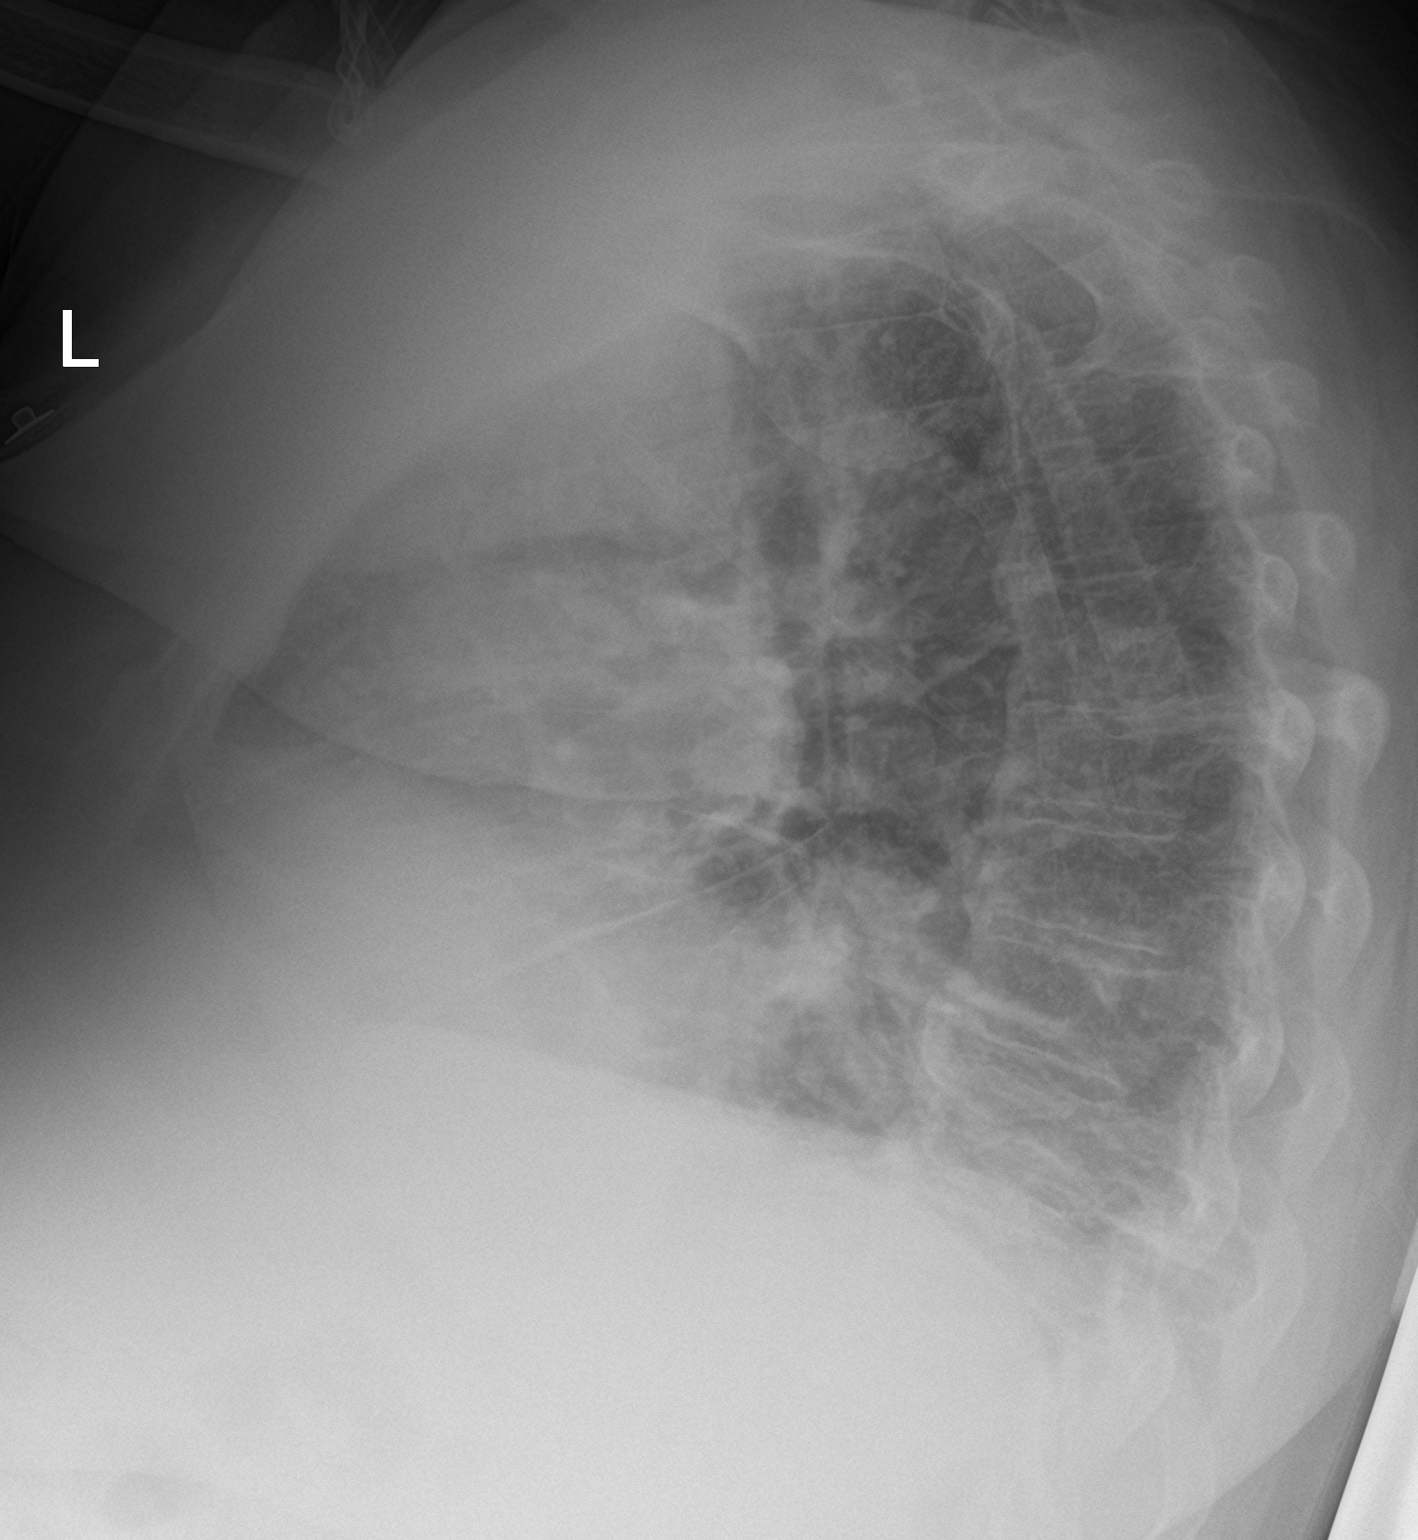

[chest ap]
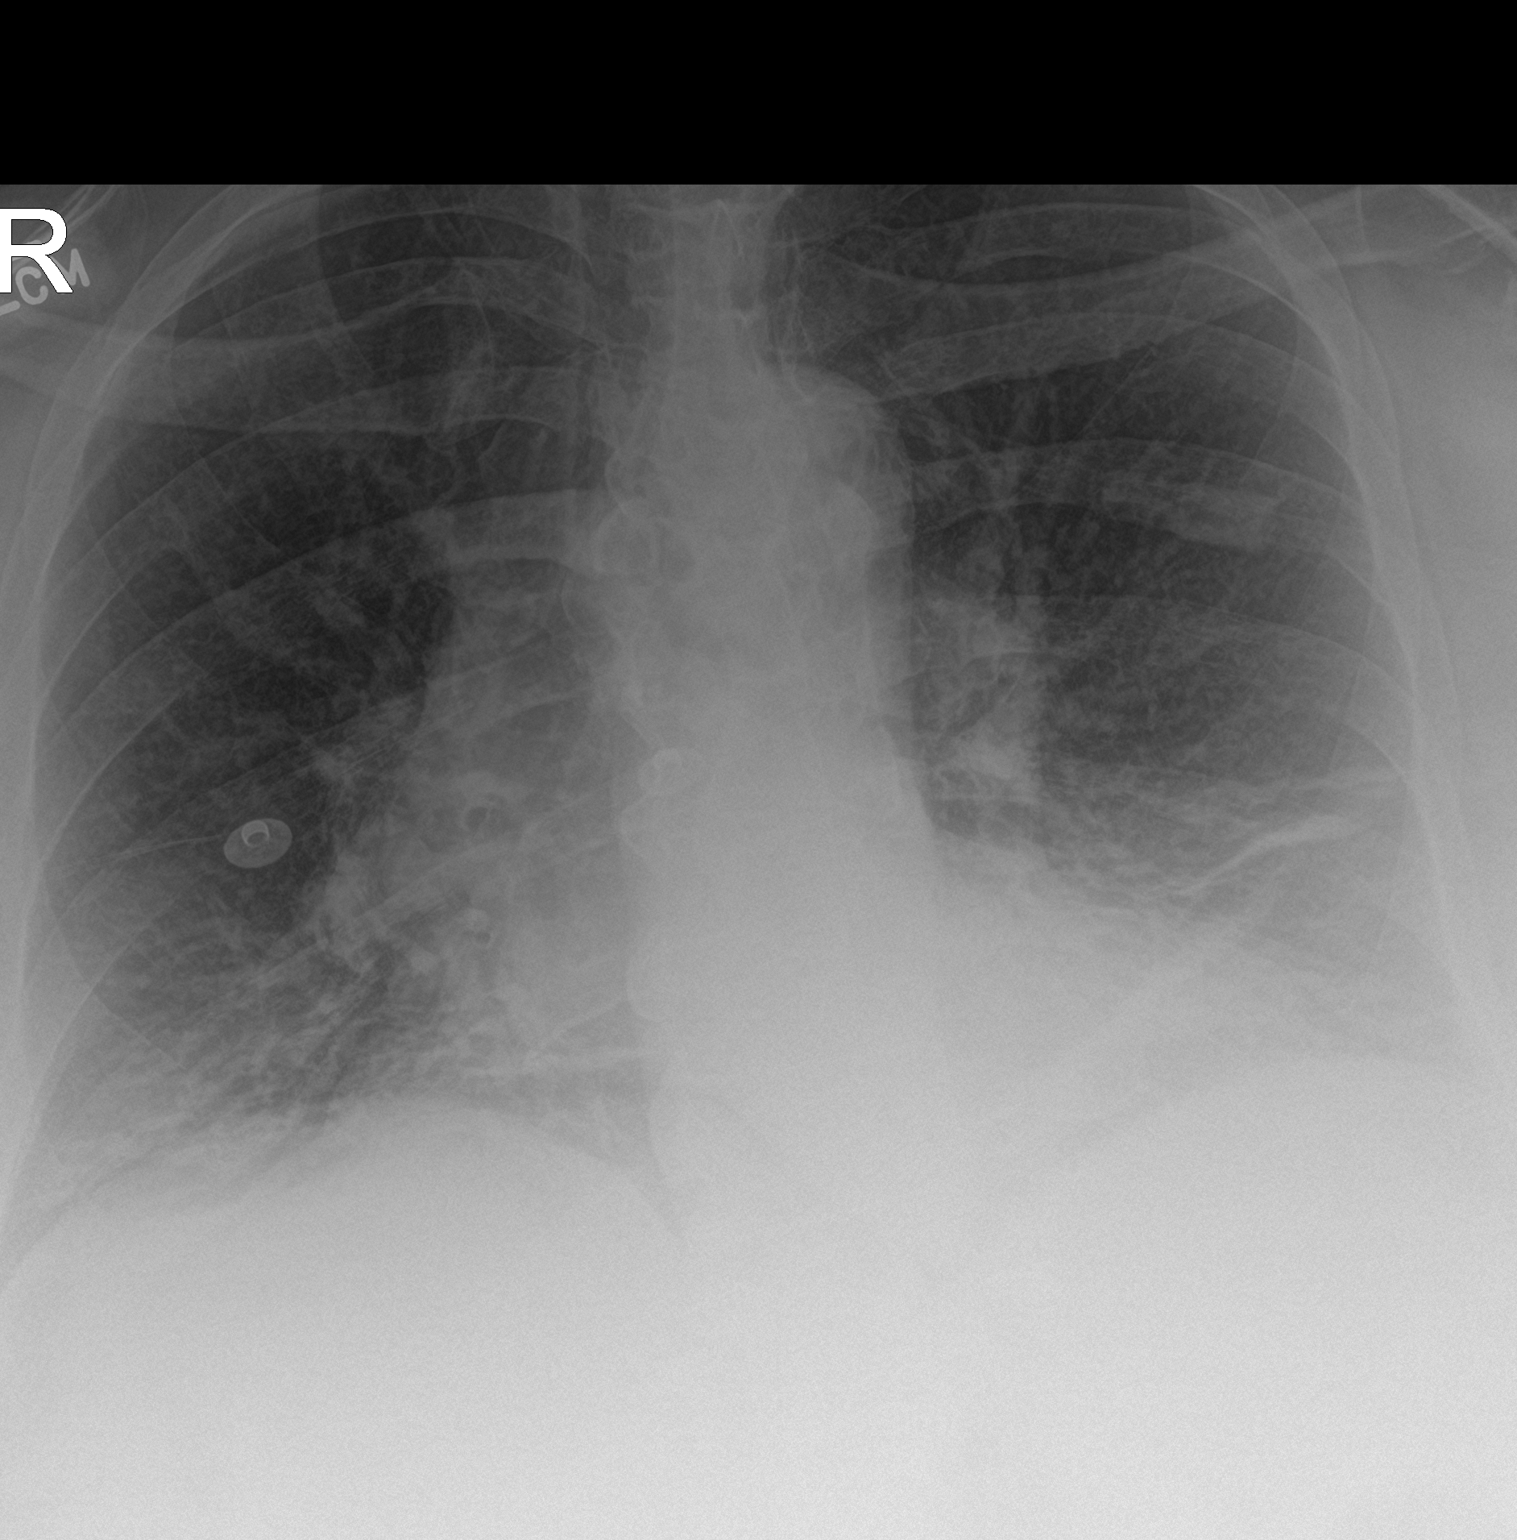

[2 of 2 positions shown; findings below may reference images not displayed]

FINDINGS: The heart size and mediastinal contours are within normal limits.
There is atherosclerotic calcification of the aorta. Strandy
opacities are noted at the lung bases, greater on the left than on
the right. No effusion or pneumothorax. Degenerative changes are
present in the thoracic spine.
IMPRESSION: Strandy opacities at the lung bases, slightly increased from the
prior exam, possible atelectasis or infiltrate.

## 2024-05-08 ENCOUNTER — Inpatient Hospital Stay: Admitting: Internal Medicine

## 2024-05-08 NOTE — Progress Notes (Unsigned)
 " [facility] [address] [place] [address] Dept: [phone] Dept Fax: [phone]  Acute Care Office Visit  Subjective:   Michele [facility] 19-Oct-1960 05/08/2024  No chief complaint on file.   HPI:    The following portions of the patient's history were reviewed and updated as appropriate: past medical history, past surgical history, family history, social history, allergies, medications, and problem list.   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) 12/07/2023   Mild episode of recurrent major depressive disorder 12/05/2023   GAD (generalized anxiety disorder) 12/05/2023   Former smoker 12/05/2023   Hypokalemia 11/13/2023   Vitamin D  deficiency 11/13/2023   Chronic idiopathic constipation 11/13/2023   Cellulitis 09/09/2023   History of COPD 06/06/2023   Bipolar disorder (HCC) 06/06/2023   At risk for polypharmacy 02/07/2023   Dependence on continuous supplemental oxygen  02/07/2023   Acute respiratory failure with hypercapnia (HCC) 12/14/2022   Delirium due to multiple etiologies, acute, hyperactive 12/14/2022   Acute respiratory failure with hypoxia and hypercapnia (HCC) 10/25/2022   Acute on chronic diastolic CHF (congestive heart failure) (HCC) 10/23/2022   Current mild episode of major depressive disorder without prior episode 06/06/2022   Fever 06/04/2022   Acute kidney injury superimposed on chronic kidney disease 02/01/2022   Acquired hypothyroidism 02/01/2022    Chronic pain disorder 02/01/2022   Respiratory failure with hypercapnia (HCC) 12/23/2020   Anemia 12/23/2020   Diabetic peripheral neuropathy (HCC) 08/18/2020   Acute on chronic hypoxic respiratory failure (HCC) 04/18/2020   Hemoptysis 04/18/2020   Encephalopathy 04/18/2020   Chronic diastolic CHF (congestive heart failure) (HCC) 04/18/2020   Severe sepsis (HCC) 04/18/2020   Morbid obesity with BMI of 50.0-59.9, adult (HCC)    Fall at home, initial encounter 06/23/2018   HNP (herniated nucleus pulposus), thoracic 06/23/2018   Essential hypertension 08/28/2017   Obesity, class 3 (HCC) 08/28/2017   COPD exacerbation (HCC) 08/04/2017   Acute and chronic respiratory failure with hypercapnia (HCC)    Encounter for geriatric assessment 02/27/2017   Family history of thyroid  cancer    Hypomagnesemia 10/03/2016   Atypical chest pain    HLD (hyperlipidemia) 09/23/2016   COPD (chronic obstructive pulmonary disease) (HCC) 09/23/2016   Gout 09/23/2016   DVT (deep vein thrombosis) in pregnancy 09/23/2016   Thyroid  cancer (HCC) 09/23/2016   Hypocalcemia 09/23/2016   Acute pulmonary edema (HCC)    Acute hypoxemic respiratory failure (HCC)    Chronic hypoxic respiratory failure (HCC) 07/16/2016   CAP (community acquired pneumonia) 01/23/2016   Multinodular goiter w/ dominant right thyroid  nodule 01/23/2016   Pulmonary hypertension (HCC) 01/23/2016   Obesity hypoventilation syndrome (HCC) 08/26/2015   Chest pain 06/16/2015   Non-insulin  dependent type 2 diabetes mellitus (HCC) 04/24/2015   Leukocytosis 04/24/2015   DM2 (diabetes mellitus, type 2) (HCC) 04/24/2015   Depression with anxiety 04/24/2015   Benign essential HTN 04/24/2015   Normocytic anemia 04/24/2015   Anxiety 04/24/2015   OSA (obstructive sleep apnea) 05/10/2012   Tobacco abuse 07/04/2011   Past Medical History:  Diagnosis Date   Anxiety  Arthritis    Asthma    Chronic diastolic CHF (congestive heart failure) (HCC)     COPD (chronic obstructive pulmonary disease) (HCC)    Uses Oxygen  at night   Depression    Diabetes mellitus without complication (HCC)    GERD (gastroesophageal reflux disease)    Gout    Headache    migraines   History of DVT of lower extremity    Hypertension    Morbid obesity with BMI of 45.0-49.9, adult (HCC)    Thyroid  cancer (HCC) 09/23/2016   Past Surgical History:  Procedure Laterality Date   BUNIONECTOMY Bilateral    CARDIAC CATHETERIZATION N/A 06/23/2015   Procedure: Right/Left Heart Cath and Coronary Angiography;  Surgeon: Ezra GORMAN Shuck, MD;  Location: [facility] INVASIVE CV LAB;  Service: Cardiovascular;  Laterality: N/A;   COLONOSCOPY WITH PROPOFOL  N/A 12/15/2015   Procedure: COLONOSCOPY WITH PROPOFOL ;  Surgeon: Norleen Hint, MD;  Location: [facility];  Service: Endoscopy;  Laterality: N/A;   RIGHT HEART CATH N/A 10/01/2019   Procedure: RIGHT HEART CATH;  Surgeon: Shuck Ezra GORMAN, MD;  Location: [facility] INVASIVE CV LAB;  Service: Cardiovascular;  Laterality: N/A;   RIGHT HEART CATH N/A 03/03/2024   Procedure: RIGHT HEART CATH;  Surgeon: Shuck Ezra GORMAN, MD;  Location: [facility] INVASIVE CV LAB;  Service: Cardiovascular;  Laterality: N/A;   THYROIDECTOMY  09/19/2016   THYROIDECTOMY N/A 09/19/2016   Procedure: TOTAL THYROIDECTOMY;  Surgeon: Eletha Boas, MD;  Location: [facility] OR;  Service: General;  Laterality: N/A;   TONSILLECTOMY     TOTAL ABDOMINAL HYSTERECTOMY  07/14/10   Family History  Problem Relation Age of Onset   Cancer Father        thought to be due to exposure to concrete   Diabetes Mother    Thyroid  cancer Mother        dx in her 31s-60s   Cancer Maternal Uncle        2 uncles with cancer NOS   Brain cancer Paternal Aunt    Cancer Cousin        maternal first cousin - NOS   Cancer Cousin        maternal first cousin - NOS   Current Medications[1] Allergies[2]   ROS: A complete ROS was performed with pertinent positives/negatives noted in the HPI. The remainder of the  ROS are negative.    Objective:   There were no vitals filed for this visit.  GENERAL: Well-appearing, in NAD. Well nourished.  SKIN: Pink, warm and dry. No rash, lesion, ulceration, or ecchymoses.  NECK: Trachea midline. Full ROM w/o pain or tenderness. No lymphadenopathy.  RESPIRATORY: Chest wall symmetrical. Respirations even and non-labored. Breath sounds clear to auscultation bilaterally.  CARDIAC: S1, S2 present, regular rate and rhythm. Peripheral pulses 2+ bilaterally.  MSK: Muscle tone and strength appropriate for age. Joints w/o tenderness, redness, or swelling. EXTREMITIES: Without clubbing, cyanosis, or edema.  NEUROLOGIC: No motor or sensory deficits. Steady, even gait.  PSYCH/MENTAL STATUS: Alert, oriented x 3. Cooperative, appropriate mood and affect.    No results found for any visits on 05/08/24.    Assessment & Plan:     There are no diagnoses linked to this encounter. No orders of the defined types were placed in this encounter.  No orders of the defined types were placed in this encounter.  Lab Orders  No laboratory test(s) ordered today   No images are attached to the encounter or orders placed in the encounter.  No follow-ups  on file.   Rosina Senters, FNP    [1]  Current Outpatient Medications:    ACCU-CHEK GUIDE TEST test strip, USE TO CHECK BLOOD SUGAR 3 TIMES DAILY, Disp: 100 each, Rfl: 12   Accu-Chek Softclix Lancets lancets, Use to check blood sugar 3 times daily, Disp: 100 each, Rfl: 12   acetaminophen  (TYLENOL ) 650 MG CR tablet, Take 1,950 mg by mouth every 8 (eight) hours as needed for pain., Disp: , Rfl:    albuterol  (PROVENTIL ) (2.5 MG/3ML) 0.083% nebulizer solution, Take 3 mLs (2.5 mg total) by nebulization every 6 (six) hours as needed for wheezing or shortness of breath. (Patient taking differently: Take 2.5 mg by nebulization in the morning, at noon, and at bedtime.), Disp: 75 mL, Rfl: 12   albuterol  (VENTOLIN  HFA) 108 (90 Base) MCG/ACT  inhaler, Inhale 1-2 puffs into the lungs every 6 (six) hours as needed for wheezing or shortness of breath., Disp: 1 each, Rfl: 1   allopurinol  (ZYLOPRIM ) 100 MG tablet, Take 1 tablet (100 mg total) by mouth daily., Disp: 90 tablet, Rfl: 3   aspirin  EC 81 MG tablet, Take 1 tablet (81 mg total) by mouth daily. Swallow whole., Disp: 90 tablet, Rfl: 3   blood glucose meter kit and supplies KIT, Dispense based on patient and insurance preference. Use up to four times daily as directed., Disp: 1 each, Rfl: 0   Blood Glucose Monitoring Suppl DEVI, 1 each by Does not apply route in the morning, at noon, and at bedtime. May substitute to any manufacturer covered by patient's insurance., Disp: 1 each, Rfl: 0   busPIRone  (BUSPAR ) 30 MG tablet, Take 1 tablet (30 mg total) by mouth 2 (two) times daily., Disp: 180 tablet, Rfl: 3   butalbital -acetaminophen -caffeine  (FIORICET ) 50-325-40 MG tablet, Take 1 tablet by mouth every 6 (six) hours as needed for headache., Disp: 14 tablet, Rfl: 0   carvedilol  (COREG ) 6.25 MG tablet, Take 1 tablet (6.25 mg total) by mouth 2 (two) times daily., Disp: 60 tablet, Rfl: 11   clotrimazole  (LOTRIMIN ) 1 % cream, Apply topically 2 (two) times daily., Disp: 30 g, Rfl: 0   diclofenac  Sodium (VOLTAREN ) 1 % GEL, Apply 4 g topically 4 (four) times daily., Disp: 120 g, Rfl: 0   dicyclomine  (BENTYL ) 20 MG tablet, Take 1 tablet (20 mg total) by mouth 2 (two) times daily., Disp: 20 tablet, Rfl: 0   doxepin  (SINEQUAN ) 10 MG capsule, Take 10 mg by mouth at bedtime., Disp: , Rfl:    EPINEPHrine  0.3 mg/0.3 mL IJ SOAJ injection, Inject 0.3 mg into the muscle once as needed for anaphylaxis., Disp: , Rfl:    fenofibrate  (TRICOR ) 145 MG tablet, Take 1 tablet (145 mg total) by mouth daily., Disp: 30 tablet, Rfl: 11   FEROSUL 325 (65 Fe) MG tablet, Take 1 tablet (325 mg total) by mouth daily., Disp: 30 tablet, Rfl: 11   gabapentin  (NEURONTIN ) 100 MG capsule, Take 1 capsule (100 mg total) by mouth 2  (two) times daily., Disp: 60 capsule, Rfl: 0   guaiFENesin  (MUCINEX ) 600 MG 12 hr tablet, Take 2 tablets (1,200 mg total) by mouth 2 (two) times daily., Disp: 60 tablet, Rfl: 0   HYDROcodone -acetaminophen  (NORCO/VICODIN) 5-325 MG tablet, Take 1 tablet by mouth every 8 (eight) hours as needed for severe pain (pain score 7-10)., Disp: 15 tablet, Rfl: 0   levocetirizine (XYZAL ) 5 MG tablet, TAKE 1 TABLET (5 MG TOTAL) BY MOUTH AT BEDTIME., Disp: 30 tablet, Rfl: 0   levothyroxine  (SYNTHROID )  125 MCG tablet, TAKE 1 TABLET (125 MCG TOTAL) BY MOUTH AT BEDTIME., Disp: 90 tablet, Rfl: 0   lidocaine  (HM LIDOCAINE  PATCH) 4 %, Place 1 patch onto the skin daily., Disp: 20 patch, Rfl: 0   losartan  (COZAAR ) 25 MG tablet, Take 1 tablet (25 mg total) by mouth daily., Disp: 30 tablet, Rfl: 11   lurasidone  (LATUDA ) 20 MG TABS tablet, Take 1 tablet (20 mg total) by mouth at bedtime. Take with a snack. This is for mood and sleep., Disp: 90 tablet, Rfl: 3   nitroGLYCERIN  (NITROSTAT ) 0.4 MG SL tablet, Place 1 tablet (0.4 mg total) under the tongue every 5 (five) minutes as needed for chest pain., Disp: 12 tablet, Rfl: 6   omeprazole  (PRILOSEC) 20 MG capsule, TAKE 2 CAPSULES (40 MG TOTAL) BY MOUTH DAILY (2AM), Disp: 180 capsule, Rfl: 1   ondansetron  (ZOFRAN -ODT) 4 MG disintegrating tablet, Take 1 tablet (4 mg total) by mouth every 8 (eight) hours as needed for nausea or vomiting., Disp: 20 tablet, Rfl: 0   OXYGEN , Inhale 2 L into the lungs continuous., Disp: , Rfl:    potassium chloride  (KLOR-CON ) 10 MEQ tablet, Take 10 mEq by mouth 2 (two) times daily., Disp: , Rfl:    rosuvastatin  (CRESTOR ) 10 MG tablet, Take 1 tablet (10 mg total) by mouth every evening., Disp: 30 tablet, Rfl: 11   [MAR Hold] torsemide  40 MG TABS, Take 80 mg by mouth 2 (two) times daily., Disp: 120 tablet, Rfl: 1   umeclidinium-vilanterol (ANORO ELLIPTA ) 62.5-25 MCG/ACT AEPB, Inhale 1 puff into the lungs daily., Disp: 98 each, Rfl: 0   Vitamin D ,  Ergocalciferol , (DRISDOL ) 1.25 MG (50000 UNIT) CAPS capsule, Take 1 capsule (50,000 Units total) by mouth every 7 (seven) days., Disp: 12 capsule, Rfl: 0 [2]  Allergies Allergen Reactions   Bee Venom Anaphylaxis    All over my body (swelling)   Fioricet  [Butalbital -Apap-Caffeine ] Nausea And Vomiting and Rash   Ibuprofen  Rash and Other (See Comments)    Severe rash   Lamisil  [Terbinafine ] Rash and Other (See Comments)    Pt states this causes her to feel funny   Nsaids Other (See Comments)    Per MD's orders    "

## 2024-05-12 ENCOUNTER — Ambulatory Visit: Admitting: Pharmacist

## 2024-05-12 ENCOUNTER — Encounter (HOSPITAL_COMMUNITY): Payer: Self-pay | Admitting: Emergency Medicine

## 2024-05-12 ENCOUNTER — Telehealth: Payer: Self-pay | Admitting: Pharmacist

## 2024-05-12 ENCOUNTER — Other Ambulatory Visit: Payer: Self-pay

## 2024-05-12 ENCOUNTER — Ambulatory Visit: Payer: Self-pay

## 2024-05-12 ENCOUNTER — Emergency Department (HOSPITAL_COMMUNITY)
Admission: EM | Admit: 2024-05-12 | Discharge: 2024-05-13 | Disposition: A | Attending: Emergency Medicine | Admitting: Emergency Medicine

## 2024-05-12 DIAGNOSIS — I517 Cardiomegaly: Secondary | ICD-10-CM | POA: Insufficient documentation

## 2024-05-12 DIAGNOSIS — E039 Hypothyroidism, unspecified: Secondary | ICD-10-CM | POA: Insufficient documentation

## 2024-05-12 DIAGNOSIS — M79604 Pain in right leg: Secondary | ICD-10-CM

## 2024-05-12 DIAGNOSIS — I5033 Acute on chronic diastolic (congestive) heart failure: Secondary | ICD-10-CM | POA: Insufficient documentation

## 2024-05-12 DIAGNOSIS — F17200 Nicotine dependence, unspecified, uncomplicated: Secondary | ICD-10-CM | POA: Insufficient documentation

## 2024-05-12 DIAGNOSIS — E119 Type 2 diabetes mellitus without complications: Secondary | ICD-10-CM | POA: Insufficient documentation

## 2024-05-12 DIAGNOSIS — Z7982 Long term (current) use of aspirin: Secondary | ICD-10-CM | POA: Insufficient documentation

## 2024-05-12 DIAGNOSIS — D649 Anemia, unspecified: Secondary | ICD-10-CM | POA: Insufficient documentation

## 2024-05-12 DIAGNOSIS — I110 Hypertensive heart disease with heart failure: Secondary | ICD-10-CM | POA: Insufficient documentation

## 2024-05-12 DIAGNOSIS — Z85850 Personal history of malignant neoplasm of thyroid: Secondary | ICD-10-CM | POA: Insufficient documentation

## 2024-05-12 DIAGNOSIS — J4489 Other specified chronic obstructive pulmonary disease: Secondary | ICD-10-CM | POA: Insufficient documentation

## 2024-05-12 DIAGNOSIS — M79651 Pain in right thigh: Secondary | ICD-10-CM | POA: Insufficient documentation

## 2024-05-12 DIAGNOSIS — J811 Chronic pulmonary edema: Secondary | ICD-10-CM | POA: Insufficient documentation

## 2024-05-12 NOTE — Telephone Encounter (Signed)
 FYI Only or Action Required?: FYI only for provider: ED advised.  Patient was last seen in primary care on 04/15/2024 by Thedora Garnette HERO, MD.  Called Nurse Triage reporting Urinary Incontinence.  Symptoms began several days ago.  Interventions attempted: Prescription medications: Hydrocodone .  Symptoms are: unchanged.  Triage Disposition: See HCP Within 4 Hours (Or PCP Triage), See Physician Within 24 Hours  Patient/caregiver understands and will follow disposition?: Yes   Message from [facility] sent at 05/12/2024 11:21 AM EST  Reason for Triage: States she's had multiple urine accidents on herself started last week, states can smell medication when she urinates when agent asked if urine had an order, no pain or burning.   Syla [phone]     Reason for Disposition  Urinating more frequently than usual (i.e., frequency) OR new-onset of the feeling of an urgent need to urinate (i.e., urgency)  [1] SEVERE pain (e.g., excruciating, unable to do any normal activities) AND [2] not improved after 2 hours of pain medicine    ED advised  Answer Assessment - Initial Assessment Questions Advised ED now.  Patient reports will go to [facility] ED.  Advised 911 if symptoms worsen. Patient verbalized understanding.    1. ONSET: When did the pain start?      Years ago 2. LOCATION: Where is the pain located?      Right leg 3. PAIN: How bad is the pain?    (Scale 1-10; or mild, moderate, severe)     10/10 5. CAUSE: What do you think is causing the leg pain?     unsure 6. OTHER SYMPTOMS: Do you have any other symptoms? (e.g., chest pain, back pain, breathing difficulty, swelling, rash, fever, numbness, weakness)  Swelling, pain, numbness intermittent.   Denies diff breathing, chest pain, faint, fever chills n/v, blue/cool, redness, unsure if hot/warm to touch  Able to stand and walk  Answer Assessment - Initial Assessment Questions 1. SYMPTOM: What's the main symptom  you're concerned about? (e.g., frequency, incontinence)     Unable to hold urine, have to go frequently; having accidents 2. ONSET: When did the    start?     2 days ago 3. PAIN: Is there any pain? If Yes, ask: How bad is it? (Scale: 1-10; mild, moderate, severe)     No pain 4. CAUSE: What do you think is causing the symptoms?     Fluid pill, unsure 5. OTHER SYMPTOMS: Do you have any other symptoms? (e.g., blood in urine, fever, flank pain, pain with urination) Denies odor, abd, back pain, pain with urination  Protocols used: Urinary Symptoms-A-AH, Leg Pain-A-AH

## 2024-05-13 ENCOUNTER — Emergency Department (HOSPITAL_COMMUNITY)

## 2024-05-13 ENCOUNTER — Telehealth: Payer: Self-pay | Admitting: Pharmacy Technician

## 2024-05-13 ENCOUNTER — Other Ambulatory Visit (HOSPITAL_COMMUNITY): Payer: Self-pay

## 2024-05-13 DIAGNOSIS — M79661 Pain in right lower leg: Secondary | ICD-10-CM

## 2024-05-13 LAB — CBC WITH DIFFERENTIAL/PLATELET
Abs Immature Granulocytes: 0.06 10*3/uL (ref 0.00–0.07)
Basophils Absolute: 0.1 10*3/uL (ref 0.0–0.1)
Basophils Relative: 0 %
Eosinophils Absolute: 0.2 10*3/uL (ref 0.0–0.5)
Eosinophils Relative: 2 %
HCT: 38.4 % (ref 36.0–46.0)
Hemoglobin: 11.3 g/dL — ABNORMAL LOW (ref 12.0–15.0)
Immature Granulocytes: 1 %
Lymphocytes Relative: 12 %
Lymphs Abs: 1.6 10*3/uL (ref 0.7–4.0)
MCH: 31 pg (ref 26.0–34.0)
MCHC: 29.4 g/dL — ABNORMAL LOW (ref 30.0–36.0)
MCV: 105.5 fL — ABNORMAL HIGH (ref 80.0–100.0)
Monocytes Absolute: 0.8 10*3/uL (ref 0.1–1.0)
Monocytes Relative: 6 %
Neutro Abs: 10.2 10*3/uL — ABNORMAL HIGH (ref 1.7–7.7)
Neutrophils Relative %: 79 %
Platelets: 242 10*3/uL (ref 150–400)
RBC: 3.64 MIL/uL — ABNORMAL LOW (ref 3.87–5.11)
RDW: 13.4 % (ref 11.5–15.5)
WBC: 12.9 10*3/uL — ABNORMAL HIGH (ref 4.0–10.5)
nRBC: 0 % (ref 0.0–0.2)

## 2024-05-13 LAB — RESP PANEL BY RT-PCR (RSV, FLU A&B, COVID)  RVPGX2
Influenza A by PCR: NEGATIVE
Influenza B by PCR: NEGATIVE
Resp Syncytial Virus by PCR: NEGATIVE
SARS Coronavirus 2 by RT PCR: NEGATIVE

## 2024-05-13 LAB — COMPREHENSIVE METABOLIC PANEL WITH GFR
ALT: 23 U/L (ref 0–44)
AST: 32 U/L (ref 15–41)
Albumin: 4.1 g/dL (ref 3.5–5.0)
Alkaline Phosphatase: 58 U/L (ref 38–126)
Anion gap: 13 (ref 5–15)
BUN: 17 mg/dL (ref 8–23)
CO2: 35 mmol/L — ABNORMAL HIGH (ref 22–32)
Calcium: 7.7 mg/dL — ABNORMAL LOW (ref 8.9–10.3)
Chloride: 93 mmol/L — ABNORMAL LOW (ref 98–111)
Creatinine, Ser: 0.84 mg/dL (ref 0.44–1.00)
GFR, Estimated: >60 mL/min
Glucose, Bld: 177 mg/dL — ABNORMAL HIGH (ref 70–99)
Potassium: 4.2 mmol/L (ref 3.5–5.1)
Sodium: 141 mmol/L (ref 135–145)
Total Bilirubin: 0.3 mg/dL (ref 0.0–1.2)
Total Protein: 6.9 g/dL (ref 6.5–8.1)

## 2024-05-13 LAB — I-STAT VENOUS BLOOD GAS, ED
Acid-Base Excess: 16 mmol/L — ABNORMAL HIGH (ref 0.0–2.0)
Bicarbonate: 43.1 mmol/L — ABNORMAL HIGH (ref 20.0–28.0)
Calcium, Ion: 0.87 mmol/L — CL (ref 1.15–1.40)
HCT: 37 % (ref 36.0–46.0)
Hemoglobin: 12.6 g/dL (ref 12.0–15.0)
O2 Saturation: 95 %
Potassium: 4 mmol/L (ref 3.5–5.1)
Sodium: 139 mmol/L (ref 135–145)
TCO2: 45 mmol/L — ABNORMAL HIGH (ref 22–32)
pCO2, Ven: 63 mmHg — ABNORMAL HIGH (ref 44–60)
pH, Ven: 7.443 — ABNORMAL HIGH (ref 7.25–7.43)
pO2, Ven: 76 mmHg — ABNORMAL HIGH (ref 32–45)

## 2024-05-13 LAB — D-DIMER, QUANTITATIVE: D-Dimer, Quant: 0.31 ug{FEU}/mL (ref 0.00–0.50)

## 2024-05-13 LAB — PRO BRAIN NATRIURETIC PEPTIDE: Pro Brain Natriuretic Peptide: 119 pg/mL

## 2024-05-13 MED ORDER — FUROSEMIDE 10 MG/ML IJ SOLN
40.0000 mg | Freq: Once | INTRAMUSCULAR | Status: AC
  Administered 2024-05-13: 40 mg via INTRAVENOUS
  Filled 2024-05-13: qty 4

## 2024-05-13 MED ORDER — KETOROLAC TROMETHAMINE 15 MG/ML IJ SOLN
15.0000 mg | Freq: Once | INTRAMUSCULAR | Status: AC
  Administered 2024-05-13: 15 mg via INTRAVENOUS
  Filled 2024-05-13: qty 1

## 2024-05-13 MED ORDER — IPRATROPIUM-ALBUTEROL 0.5-2.5 (3) MG/3ML IN SOLN
3.0000 mL | Freq: Once | RESPIRATORY_TRACT | Status: AC
  Administered 2024-05-13: 3 mL via RESPIRATORY_TRACT
  Filled 2024-05-13: qty 3

## 2024-05-13 NOTE — Discharge Instructions (Signed)
 It did look like you had a little too much fluid on you.  You were given some insulin  here.  However follow-up with your doctor.  The Doppler was somewhat limited but did not show a clot.  Follow-up with your doctor.

## 2024-05-13 NOTE — ED Notes (Signed)
 Pt requested to have BP cuff removed d/t itchiness

## 2024-05-13 NOTE — ED Notes (Signed)
Pt provided beverage and snack

## 2024-05-13 NOTE — ED Notes (Signed)
 Patient transported to Ultrasound

## 2024-05-13 NOTE — Progress Notes (Signed)
 Right lower ext venous  has been completed. Refer to [facility] under chart review to view preliminary results.   05/13/2024  9:45 AM Michele [facility], Ricka BIRCH

## 2024-05-13 NOTE — ED Notes (Signed)
 RN provided comfort by scratching pt right heel

## 2024-05-13 NOTE — ED Notes (Signed)
 RN updated pt on next steps

## 2024-05-14 NOTE — Telephone Encounter (Signed)
 PA for [facility] pending.

## 2024-05-15 ENCOUNTER — Other Ambulatory Visit (HOSPITAL_COMMUNITY): Payer: Self-pay

## 2024-05-16 ENCOUNTER — Other Ambulatory Visit: Payer: Self-pay | Admitting: Family Medicine

## 2024-05-16 DIAGNOSIS — G894 Chronic pain syndrome: Secondary | ICD-10-CM

## 2024-05-20 IMAGING — DX DG CHEST 1V
1 series · 1 of 1 positions shown · non-contrast
Comparison: 08/29/2021.

CLINICAL DATA: Cough.

EXAM:
CHEST  1 VIEW

[x chest ap]
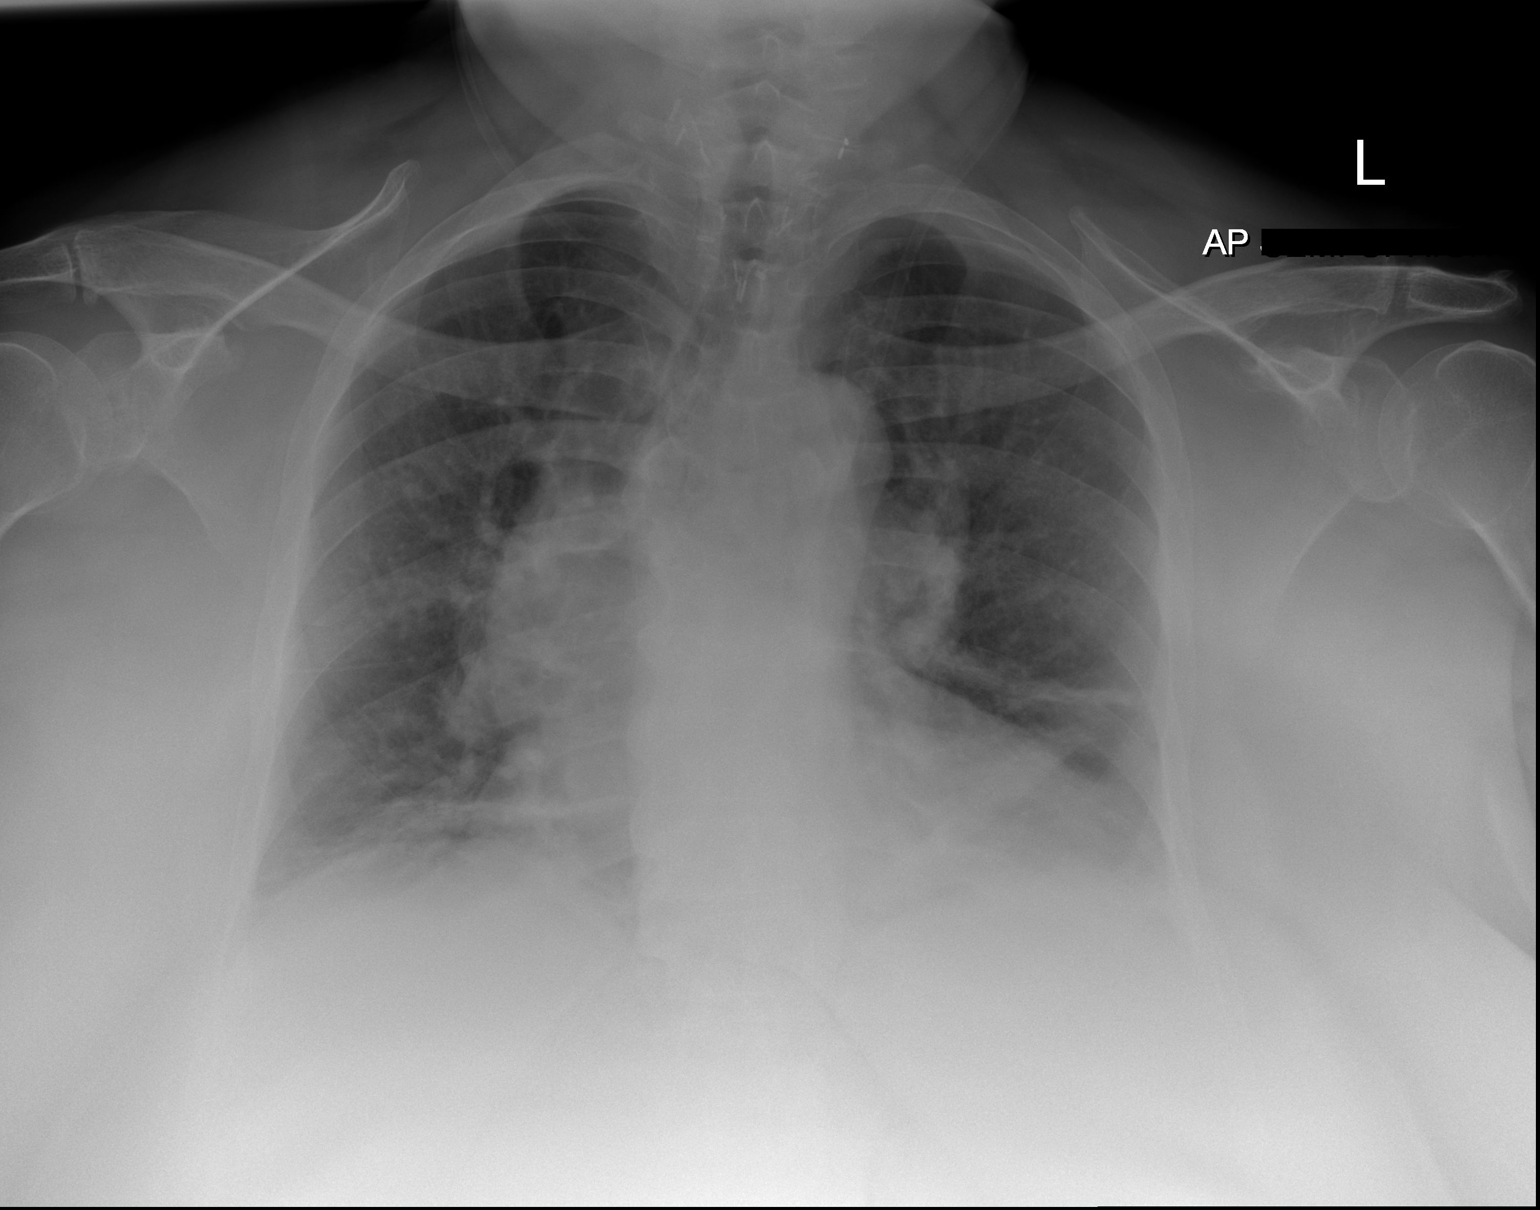

[1 of 1 positions shown; findings below may reference images not displayed]

FINDINGS: Heart is enlarged the mediastinal contour stable. The pulmonary
vasculature is distended. Strandy opacities are noted in the mid to
lower lung fields bilaterally. No definite effusion or pneumothorax.
No acute osseous abnormality.
IMPRESSION: 1. Cardiomegaly with distended pulmonary vasculature.
2. Strandy opacities at the lung bases, not significantly changed
from the prior exam.

## 2024-05-21 IMAGING — CT CT ANGIO CHEST
2 of 7 series · 17 of 46 positions shown · IV contrast (APPLIED)
Comparison: 06/27/2021

CLINICAL DATA: Weakness

EXAM:
CT ANGIOGRAPHY CHEST WITH CONTRAST
TECHNIQUE: Multidetector CT imaging of the chest was performed using the
standard protocol during bolus administration of intravenous
contrast. Multiplanar CT image reconstructions and MIPs were
obtained to evaluate the vascular anatomy.

[Series 5: thins · axial · 0.78mm/px · z∈[-467,-248]mm · 14 of 353 slices shown]
[im 20/353  lung]
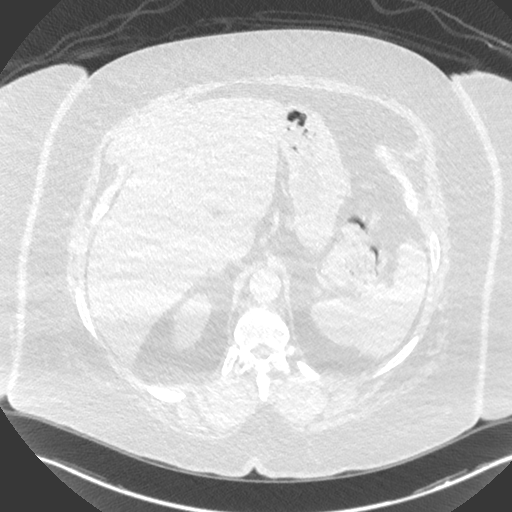
[im 40/353  soft-tissue]
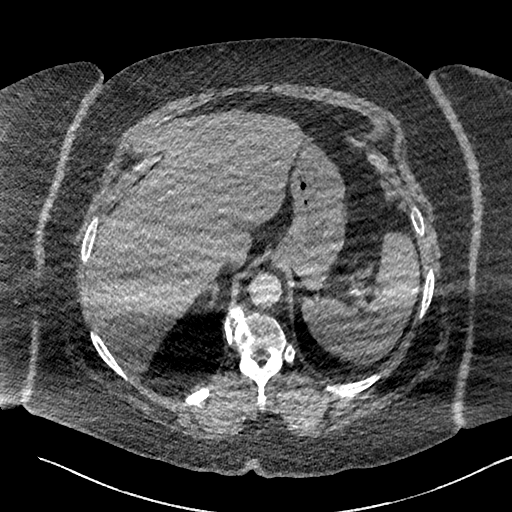
[im 79/353  lung]
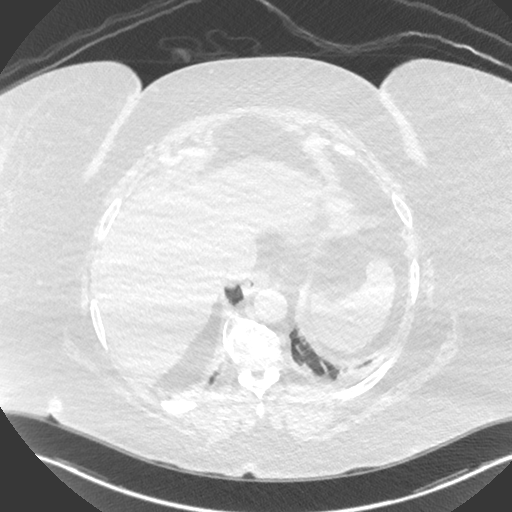
[im 98/353  soft-tissue]
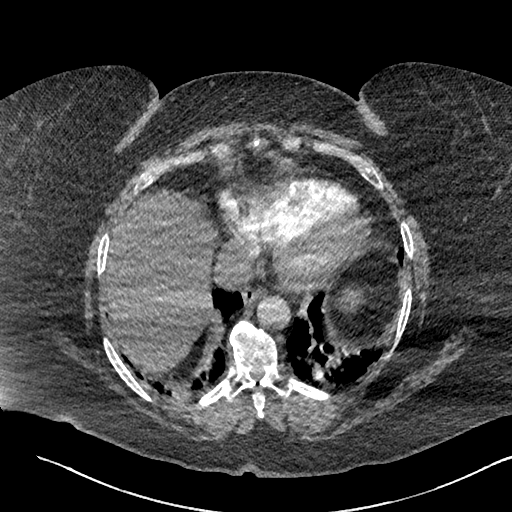
[im 118/353  lung]
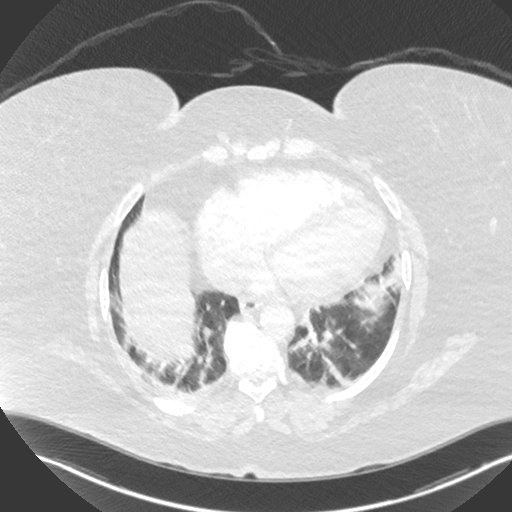
[im 137/353  soft-tissue]
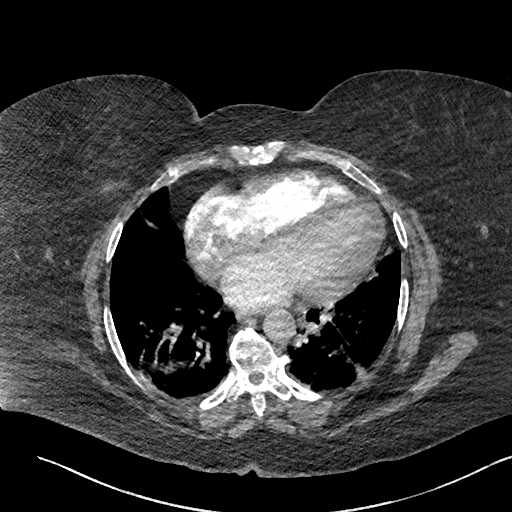
[im 157/353  lung]
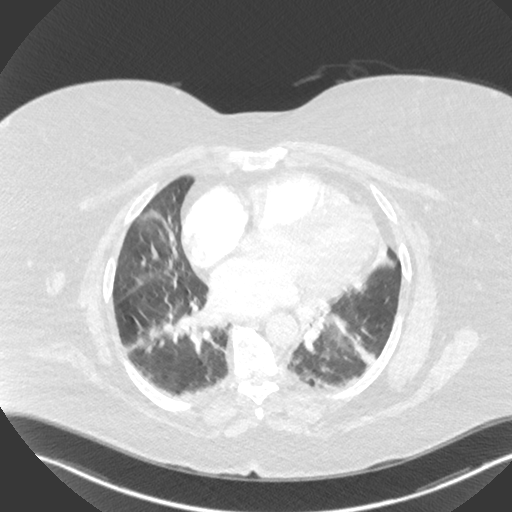
[im 196/353  soft-tissue]
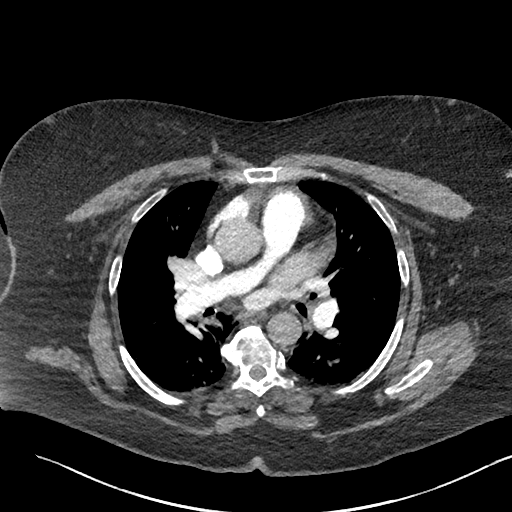
[im 216/353  lung]
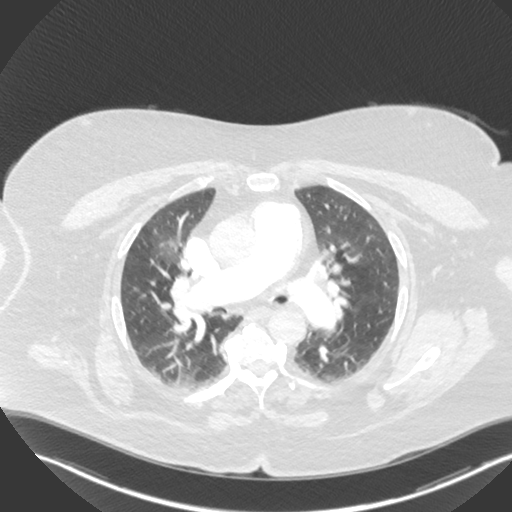
[im 235/353  soft-tissue]
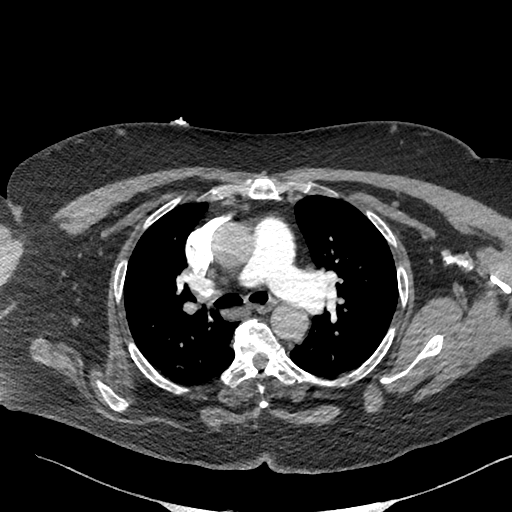
[im 255/353  lung]
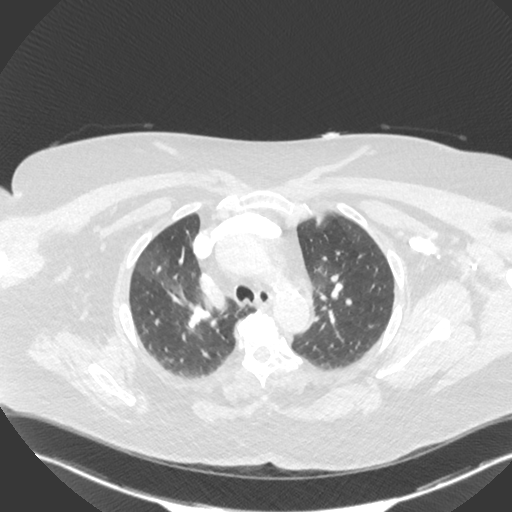
[im 274/353  soft-tissue]
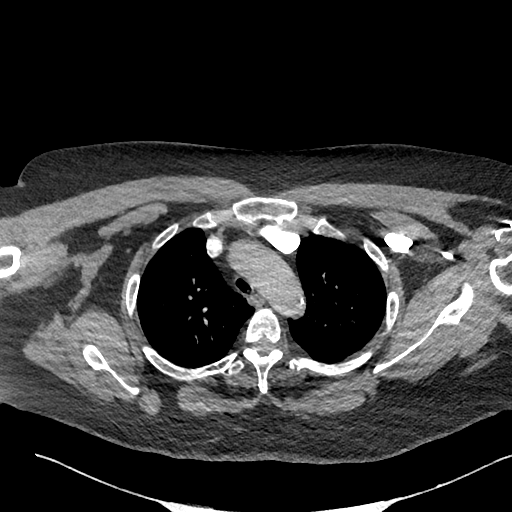
[im 313/353  lung]
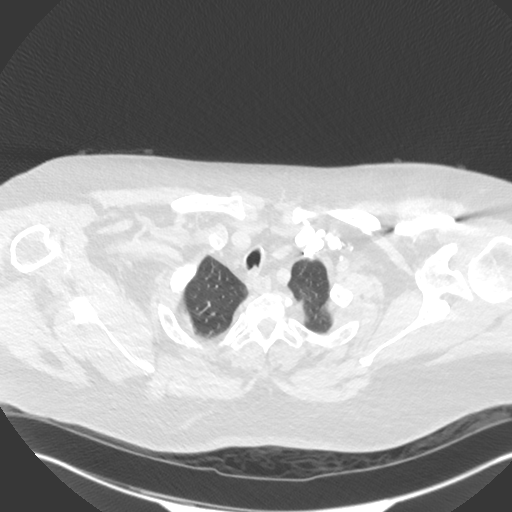
[im 333/353  soft-tissue]
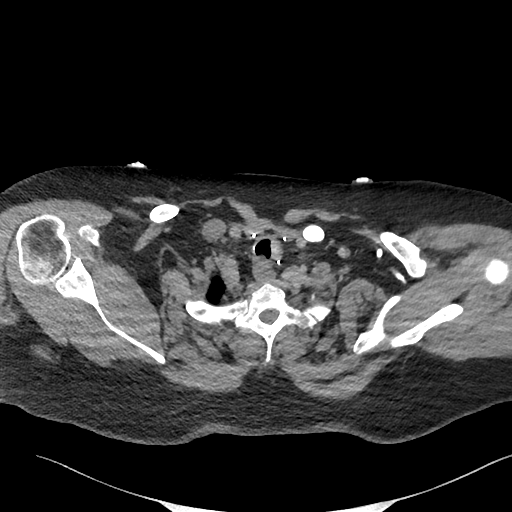

[Series 6: cor · coronal · 0.51mm/px · 3 of 177 slices shown]
[im 45/177  soft-tissue]
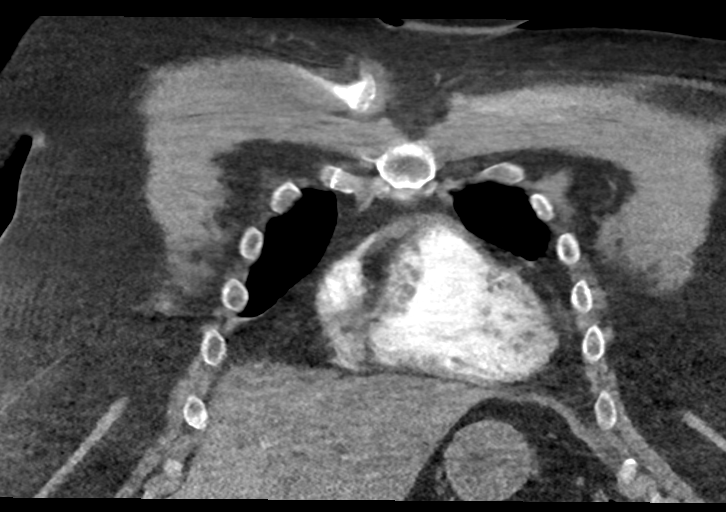
[im 89/177  soft-tissue]
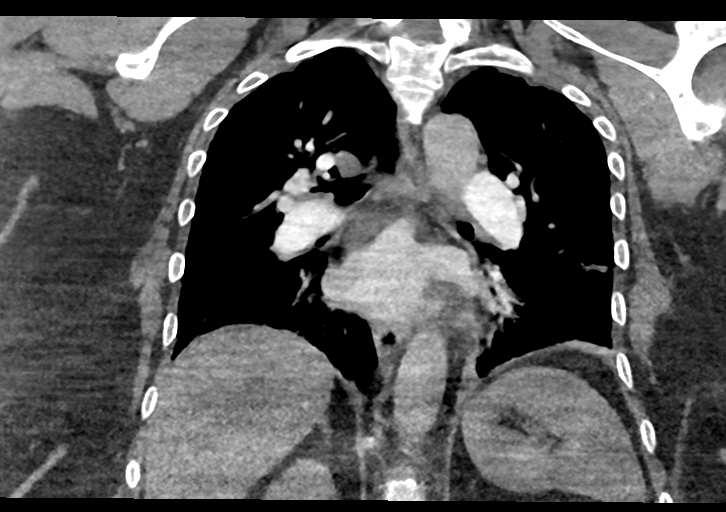
[im 133/177  soft-tissue]
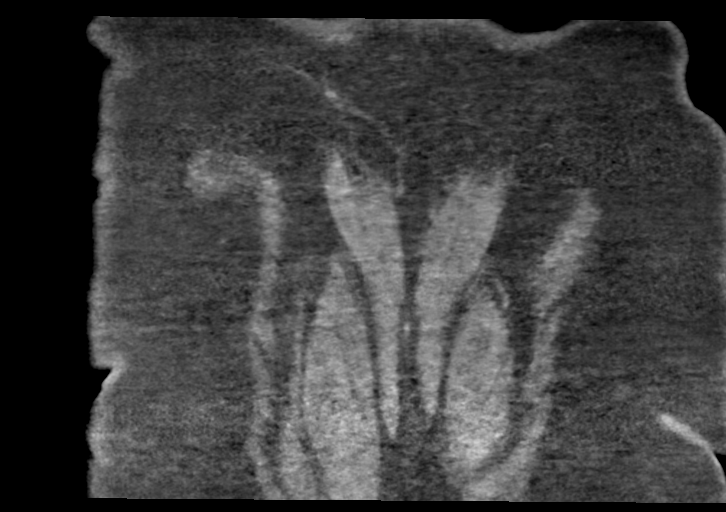

[17 of 46 positions shown; findings below may reference images not displayed]

RADIATION DOSE REDUCTION: This exam was performed according to the
departmental dose-optimization program which includes automated
exposure control, adjustment of the mA and/or kV according to
patient size and/or use of iterative reconstruction technique.

CONTRAST:  100mL OMNIPAQUE IOHEXOL 350 MG/ML SOLN
FINDINGS: Cardiovascular: Satisfactory opacification of the pulmonary arteries
to the segmental level. No evidence of pulmonary embolism. Stable
cardiomegaly. Coronary artery atherosclerosis. Thoracic aortic
atherosclerosis. No pericardial effusion. Dilated main pulmonary
artery as can be seen with pulmonary arterial hypertension.

Mediastinum/Nodes: No enlarged mediastinal, hilar, or axillary lymph
nodes. Thyroid gland, trachea, and esophagus demonstrate no
significant findings.

Lungs/Pleura: Mild patchy areas of ground-glass opacities and mild
interstitial thickening at the lung bases. Bibasilar atelectasis. No
pleural effusion or pneumothorax. No focal consolidation.

Upper Abdomen: No acute abnormality.

Musculoskeletal: No chest wall abnormality. No acute or significant
osseous findings. Anterior bridging osteophytes of the mid and lower
thoracic spine as can be seen with diffuse idiopathic skeletal
hyperostosis.

Review of the MIP images confirms the above findings.
IMPRESSION: 1. No pulmonary embolism.
2. Cardiomegaly with mild patchy areas of ground-glass opacities and
mild interstitial thickening at the lung bases, likely due to mild
CHF.
3. Dilated main pulmonary artery as can be seen with pulmonary
arterial hypertension.
4. Aortic Atherosclerosis ([id number]).

## 2024-05-21 IMAGING — CT CT NECK W/ CM
4 of 5 series · 14 of 35 positions shown, 16 images · IV contrast (agent unspecified)
Comparison: None Available.

CLINICAL DATA: Palate weakness (CN 9)

EXAM:
CT NECK WITH CONTRAST
TECHNIQUE: Multidetector CT imaging of the neck was performed using the
standard protocol following the bolus administration of intravenous
contrast.

[Series 6: axial neck · axial · 0.45mm/px · z∈[-254,-156]mm · 3 of 99 slices shown]
[im 25/99  bone]
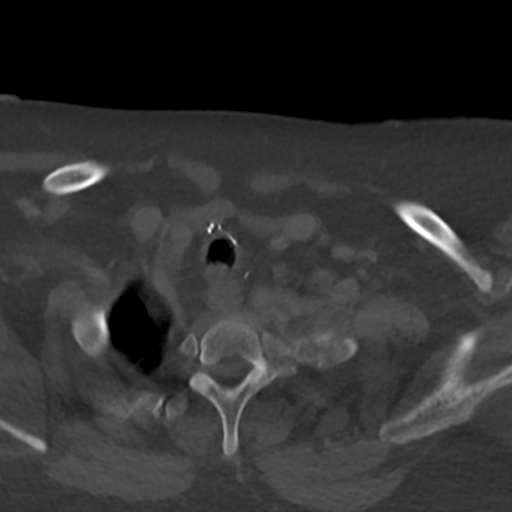
[im 50/99  bone]
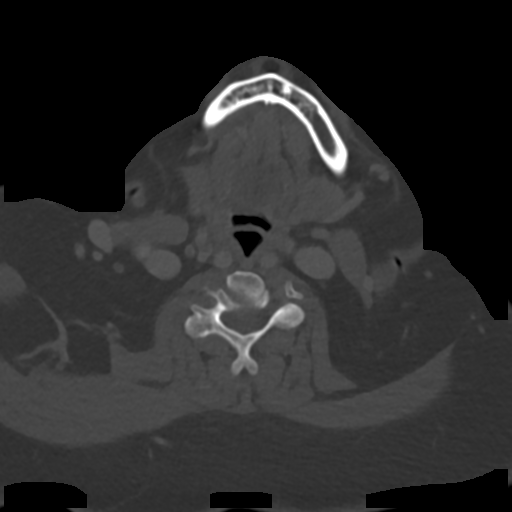
[im 74/99  bone]
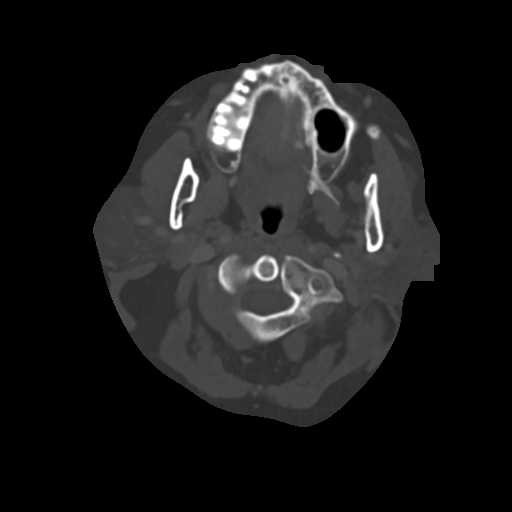

[Series 8: cor neck · coronal · 0.37mm/px · 3 of 141 slices shown]
[im 29/141  bone]
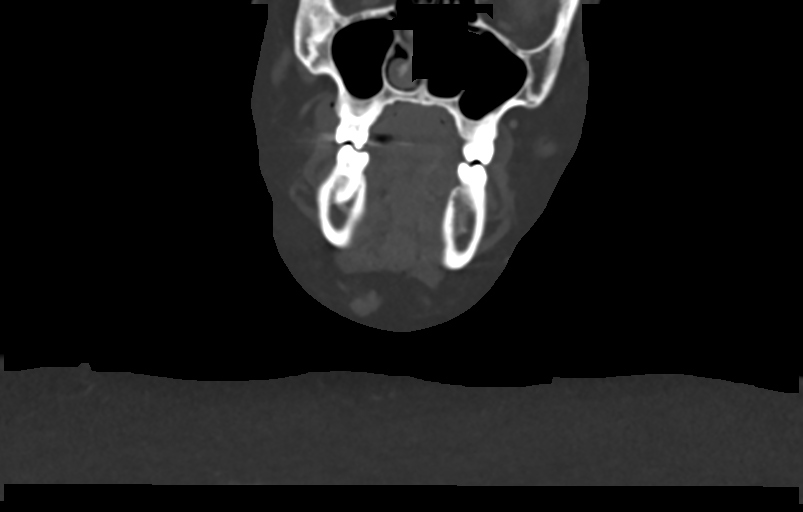
[im 57/141  bone]
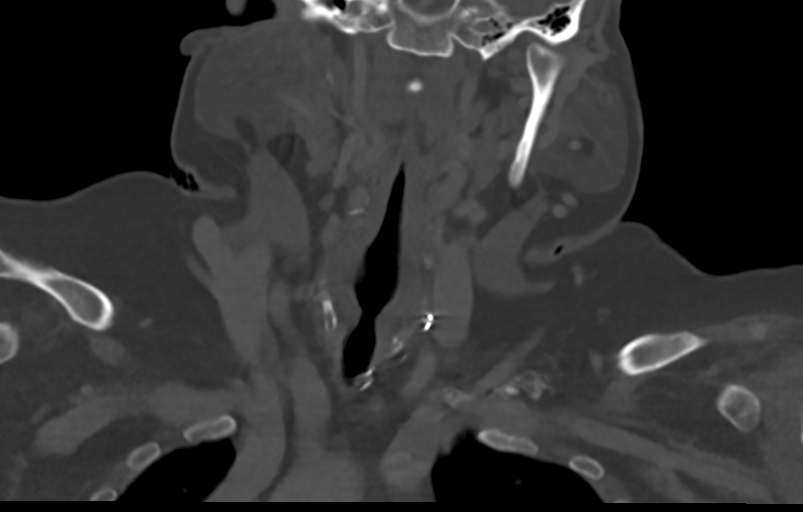
[im 85/141  bone]
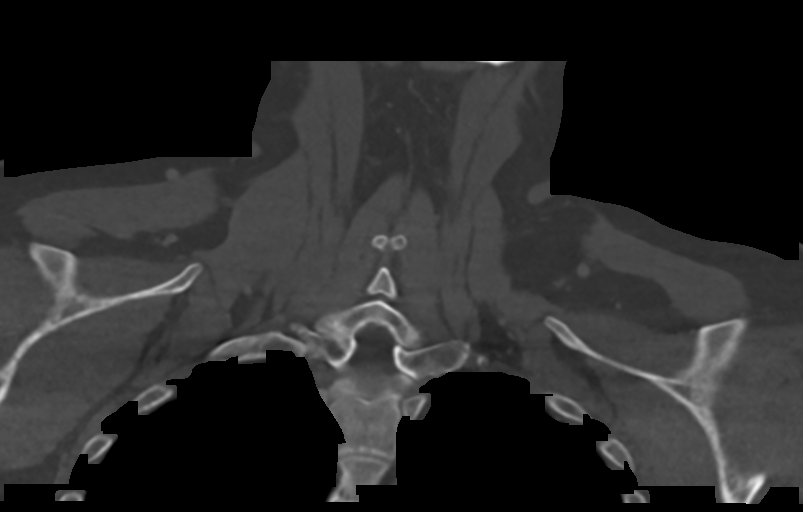

[Series 9: sag neck · sagittal · 0.44mm/px · 5 of 188 slices shown, 6 images]
[im 63/188  bone]
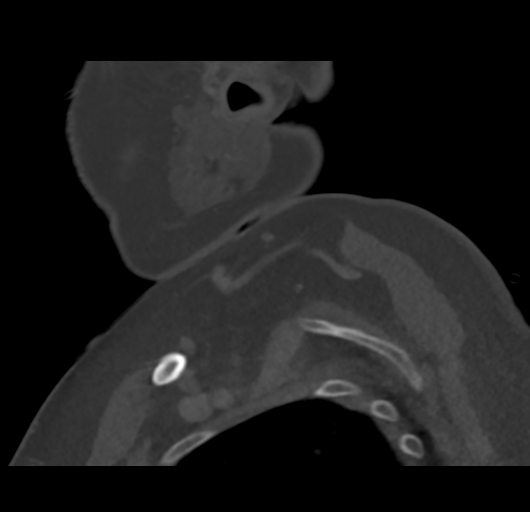
[im 78/188  bone]
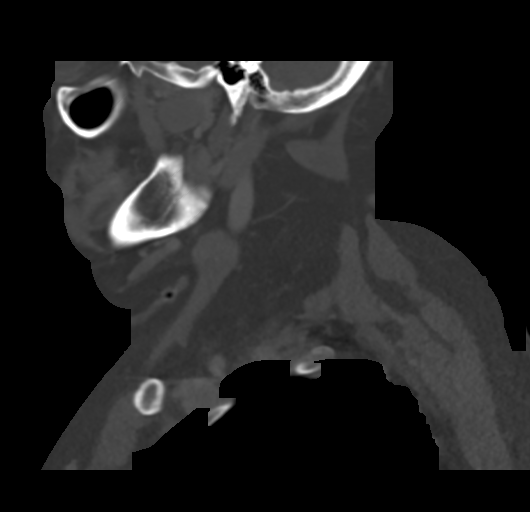
[im 94/188  soft-tissue]
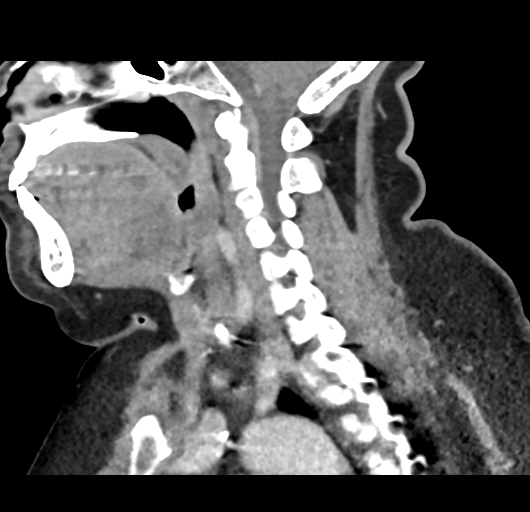
[im 94/188  bone]
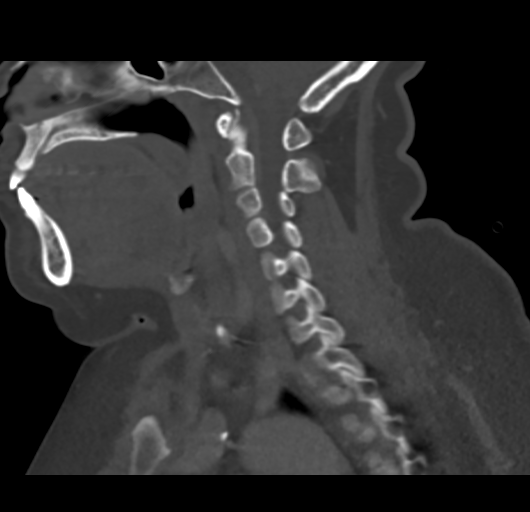
[im 110/188  bone]
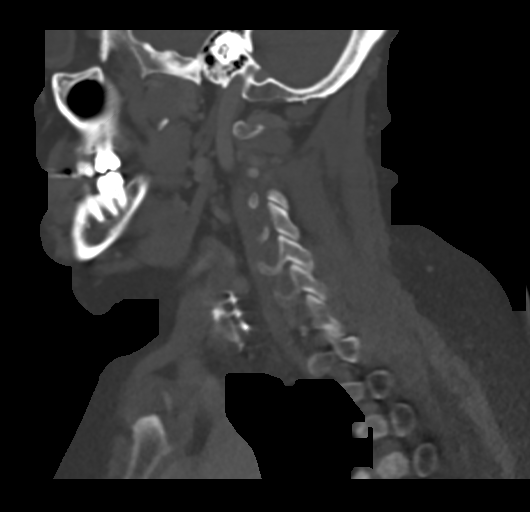
[im 125/188  bone]
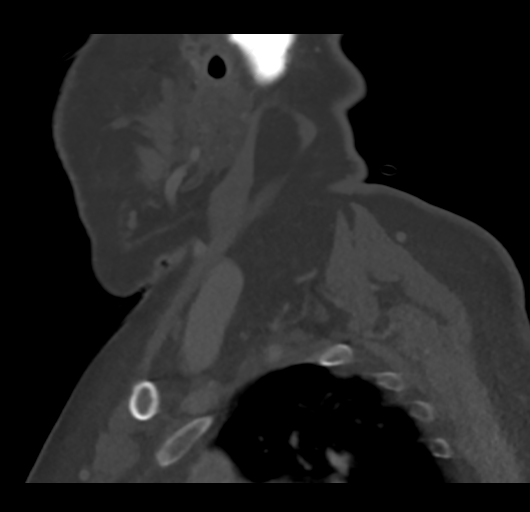

[Series 10: ax oropharynx · axial · 0.49mm/px · z∈[-294,-182]mm · 3 of 122 slices shown, 4 images]
[im 31/122  soft-tissue]
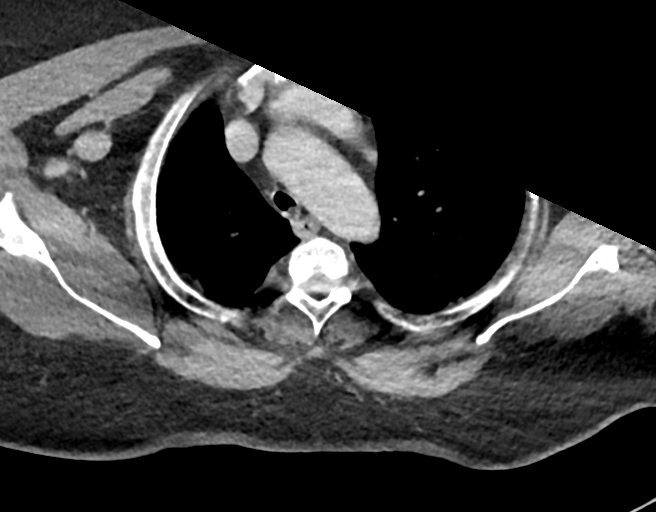
[im 31/122  bone]
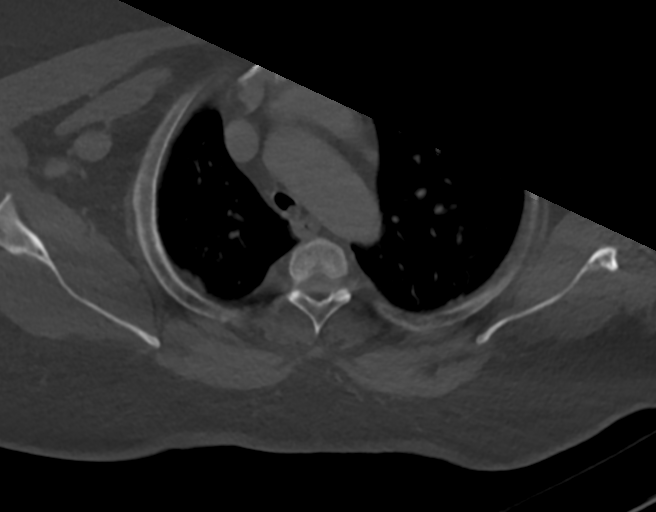
[im 61/122  bone]
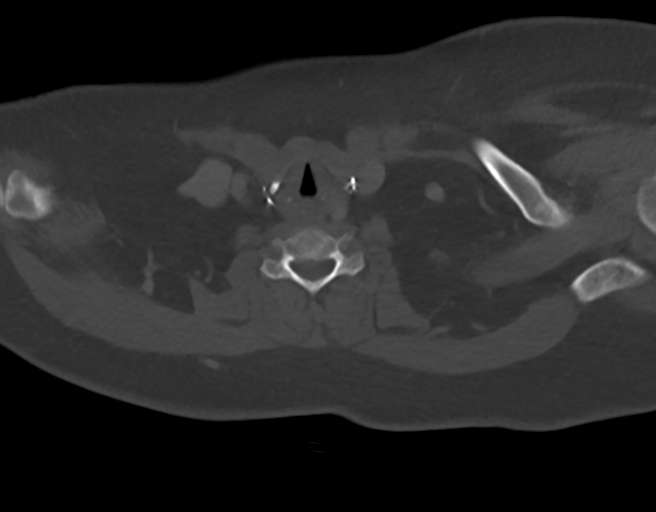
[im 91/122  bone]
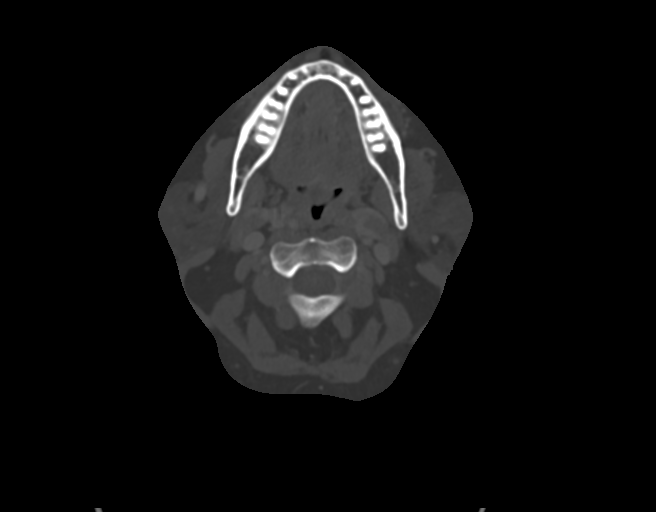

[14 of 35 positions shown; findings below may reference images not displayed]

RADIATION DOSE REDUCTION: This exam was performed according to the
departmental dose-optimization program which includes automated
exposure control, adjustment of the mA and/or kV according to
patient size and/or use of iterative reconstruction technique.

CONTRAST:  100mL OMNIPAQUE IOHEXOL 350 MG/ML SOLN
FINDINGS: Pharynx and larynx: Unremarkable.  No mass or swelling.

Salivary glands: Parotid and submandibular glands are unremarkable.

Thyroid: Thyroidectomy.

Lymph nodes: No enlarged or abnormal density nodes.

Vascular: Major neck vessels are patent. Minimal calcified plaque at
the right common carotid bifurcation. Partially retropharyngeal
course of the carotids.

Limited intracranial: No abnormal enhancement.

Visualized orbits: Deformity of the left medial orbital wall with
herniation of orbital fat may reflect sequelae of prior trauma.
There is slight extension of the medial rectus into the defect.

Mastoids and visualized paranasal sinuses: Minor mucosal thickening.
Mastoid air cells are clear.

Skeleton: No acute or significant osseous abnormality.

Upper chest: Dictated separately.
IMPRESSION: No neck mass or adenopathy.

## 2024-06-09 ENCOUNTER — Inpatient Hospital Stay: Admitting: Family Medicine
